# Patient Record
Sex: Female | Born: 1960
Health system: Southern US, Community
[De-identification: ages and names within clinical notes are randomized; demographics above are authoritative.]

## PROBLEM LIST (undated history)

## (undated) DIAGNOSIS — N289 Disorder of kidney and ureter, unspecified: Secondary | ICD-10-CM

## (undated) DIAGNOSIS — R519 Headache, unspecified: Secondary | ICD-10-CM

## (undated) DIAGNOSIS — J3089 Other allergic rhinitis: Secondary | ICD-10-CM

## (undated) DIAGNOSIS — H33049 Retinal detachment with retinal dialysis, unspecified eye: Secondary | ICD-10-CM

## (undated) DIAGNOSIS — D649 Anemia, unspecified: Secondary | ICD-10-CM

## (undated) DIAGNOSIS — R0601 Orthopnea: Secondary | ICD-10-CM

## (undated) DIAGNOSIS — Z992 Dependence on renal dialysis: Secondary | ICD-10-CM

## (undated) DIAGNOSIS — E119 Type 2 diabetes mellitus without complications: Secondary | ICD-10-CM

## (undated) DIAGNOSIS — N189 Chronic kidney disease, unspecified: Secondary | ICD-10-CM

## (undated) DIAGNOSIS — I219 Acute myocardial infarction, unspecified: Secondary | ICD-10-CM

## (undated) DIAGNOSIS — J449 Chronic obstructive pulmonary disease, unspecified: Secondary | ICD-10-CM

## (undated) DIAGNOSIS — J45909 Unspecified asthma, uncomplicated: Secondary | ICD-10-CM

## (undated) DIAGNOSIS — I5031 Acute diastolic (congestive) heart failure: Secondary | ICD-10-CM

## (undated) DIAGNOSIS — K3184 Gastroparesis: Secondary | ICD-10-CM

## (undated) DIAGNOSIS — Z8719 Personal history of other diseases of the digestive system: Secondary | ICD-10-CM

## (undated) DIAGNOSIS — R51 Headache: Secondary | ICD-10-CM

## (undated) DIAGNOSIS — J40 Bronchitis, not specified as acute or chronic: Secondary | ICD-10-CM

## (undated) DIAGNOSIS — N186 End stage renal disease: Secondary | ICD-10-CM

## (undated) DIAGNOSIS — I2510 Atherosclerotic heart disease of native coronary artery without angina pectoris: Secondary | ICD-10-CM

## (undated) DIAGNOSIS — R600 Localized edema: Secondary | ICD-10-CM

## (undated) DIAGNOSIS — J189 Pneumonia, unspecified organism: Secondary | ICD-10-CM

## (undated) DIAGNOSIS — C649 Malignant neoplasm of unspecified kidney, except renal pelvis: Secondary | ICD-10-CM

## (undated) DIAGNOSIS — E215 Disorder of parathyroid gland, unspecified: Secondary | ICD-10-CM

## (undated) DIAGNOSIS — K5790 Diverticulosis of intestine, part unspecified, without perforation or abscess without bleeding: Secondary | ICD-10-CM

## (undated) DIAGNOSIS — F329 Major depressive disorder, single episode, unspecified: Secondary | ICD-10-CM

## (undated) DIAGNOSIS — E785 Hyperlipidemia, unspecified: Secondary | ICD-10-CM

## (undated) DIAGNOSIS — F419 Anxiety disorder, unspecified: Secondary | ICD-10-CM

## (undated) DIAGNOSIS — R0600 Dyspnea, unspecified: Secondary | ICD-10-CM

## (undated) DIAGNOSIS — S62109A Fracture of unspecified carpal bone, unspecified wrist, initial encounter for closed fracture: Secondary | ICD-10-CM

## (undated) DIAGNOSIS — K219 Gastro-esophageal reflux disease without esophagitis: Secondary | ICD-10-CM

## (undated) DIAGNOSIS — R062 Wheezing: Secondary | ICD-10-CM

## (undated) DIAGNOSIS — F32A Depression, unspecified: Secondary | ICD-10-CM

## (undated) DIAGNOSIS — Z862 Personal history of diseases of the blood and blood-forming organs and certain disorders involving the immune mechanism: Secondary | ICD-10-CM

## (undated) DIAGNOSIS — R42 Dizziness and giddiness: Secondary | ICD-10-CM

## (undated) DIAGNOSIS — M1990 Unspecified osteoarthritis, unspecified site: Secondary | ICD-10-CM

## (undated) DIAGNOSIS — E1140 Type 2 diabetes mellitus with diabetic neuropathy, unspecified: Secondary | ICD-10-CM

## (undated) DIAGNOSIS — I10 Essential (primary) hypertension: Secondary | ICD-10-CM

## (undated) DIAGNOSIS — H9190 Unspecified hearing loss, unspecified ear: Secondary | ICD-10-CM

## (undated) DIAGNOSIS — I739 Peripheral vascular disease, unspecified: Secondary | ICD-10-CM

## (undated) DIAGNOSIS — I209 Angina pectoris, unspecified: Secondary | ICD-10-CM

## (undated) DIAGNOSIS — I340 Nonrheumatic mitral (valve) insufficiency: Secondary | ICD-10-CM

## (undated) HISTORY — PX: CATARACT EXTRACTION W/ INTRAOCULAR LENS IMPLANT: SHX1309

## (undated) HISTORY — PX: BREAST CYST EXCISION: SHX579

## (undated) HISTORY — DX: Chronic kidney disease, unspecified: N18.9

## (undated) HISTORY — PX: DIALYSIS FISTULA CREATION: SHX611

## (undated) HISTORY — DX: Personal history of diseases of the blood and blood-forming organs and certain disorders involving the immune mechanism: Z86.2

## (undated) HISTORY — PX: CHOLECYSTECTOMY: SHX55

## (undated) HISTORY — PX: HAND SURGERY: SHX662

## (undated) HISTORY — PX: OTHER SURGICAL HISTORY: SHX169

## (undated) HISTORY — PX: APPENDECTOMY: SHX54

---

## 1990-09-22 HISTORY — PX: ABDOMINAL HYSTERECTOMY: SHX81

## 2003-12-09 ENCOUNTER — Other Ambulatory Visit: Payer: Self-pay

## 2003-12-10 ENCOUNTER — Other Ambulatory Visit: Payer: Self-pay

## 2004-01-27 ENCOUNTER — Other Ambulatory Visit: Payer: Self-pay

## 2004-02-09 ENCOUNTER — Other Ambulatory Visit: Payer: Self-pay

## 2004-07-22 ENCOUNTER — Inpatient Hospital Stay: Payer: Self-pay | Admitting: Anesthesiology

## 2004-07-26 ENCOUNTER — Emergency Department: Payer: Self-pay | Admitting: Emergency Medicine

## 2004-07-26 ENCOUNTER — Other Ambulatory Visit: Payer: Self-pay

## 2004-07-27 ENCOUNTER — Inpatient Hospital Stay: Payer: Self-pay | Admitting: Internal Medicine

## 2004-09-09 ENCOUNTER — Emergency Department: Payer: Self-pay | Admitting: General Practice

## 2004-09-09 ENCOUNTER — Other Ambulatory Visit: Payer: Self-pay

## 2004-09-22 DIAGNOSIS — H33049 Retinal detachment with retinal dialysis, unspecified eye: Secondary | ICD-10-CM

## 2004-09-22 HISTORY — DX: Retinal detachment with retinal dialysis, unspecified eye: H33.049

## 2004-11-05 ENCOUNTER — Other Ambulatory Visit: Payer: Self-pay

## 2004-11-05 ENCOUNTER — Inpatient Hospital Stay: Payer: Self-pay | Admitting: Internal Medicine

## 2006-01-20 ENCOUNTER — Ambulatory Visit: Payer: Self-pay | Admitting: Family Medicine

## 2006-02-28 ENCOUNTER — Emergency Department: Payer: Self-pay | Admitting: Emergency Medicine

## 2006-02-28 ENCOUNTER — Other Ambulatory Visit: Payer: Self-pay

## 2006-04-08 ENCOUNTER — Ambulatory Visit: Payer: Self-pay | Admitting: Vascular Surgery

## 2006-05-22 ENCOUNTER — Ambulatory Visit: Payer: Self-pay | Admitting: Vascular Surgery

## 2006-06-16 ENCOUNTER — Ambulatory Visit: Payer: Self-pay | Admitting: Vascular Surgery

## 2006-06-19 IMAGING — CR DG CHEST 2V
1 series · 2 of 2 positions shown · non-contrast
Comparison: none

REASON FOR EXAM: Shortness of breath, cough
COMMENTS:

PROCEDURE:     DXR - DXR CHEST PA (OR AP) AND LATERAL  - February 11, 2006 [DATE]
RESULT:     The lung fields are clear. The heart, mediastinal and osseous
structures show no significant abnormalities.

[Series 1: view not recorded · 0.17mm/px · 2 of 2 slices shown]
[im 1/2]
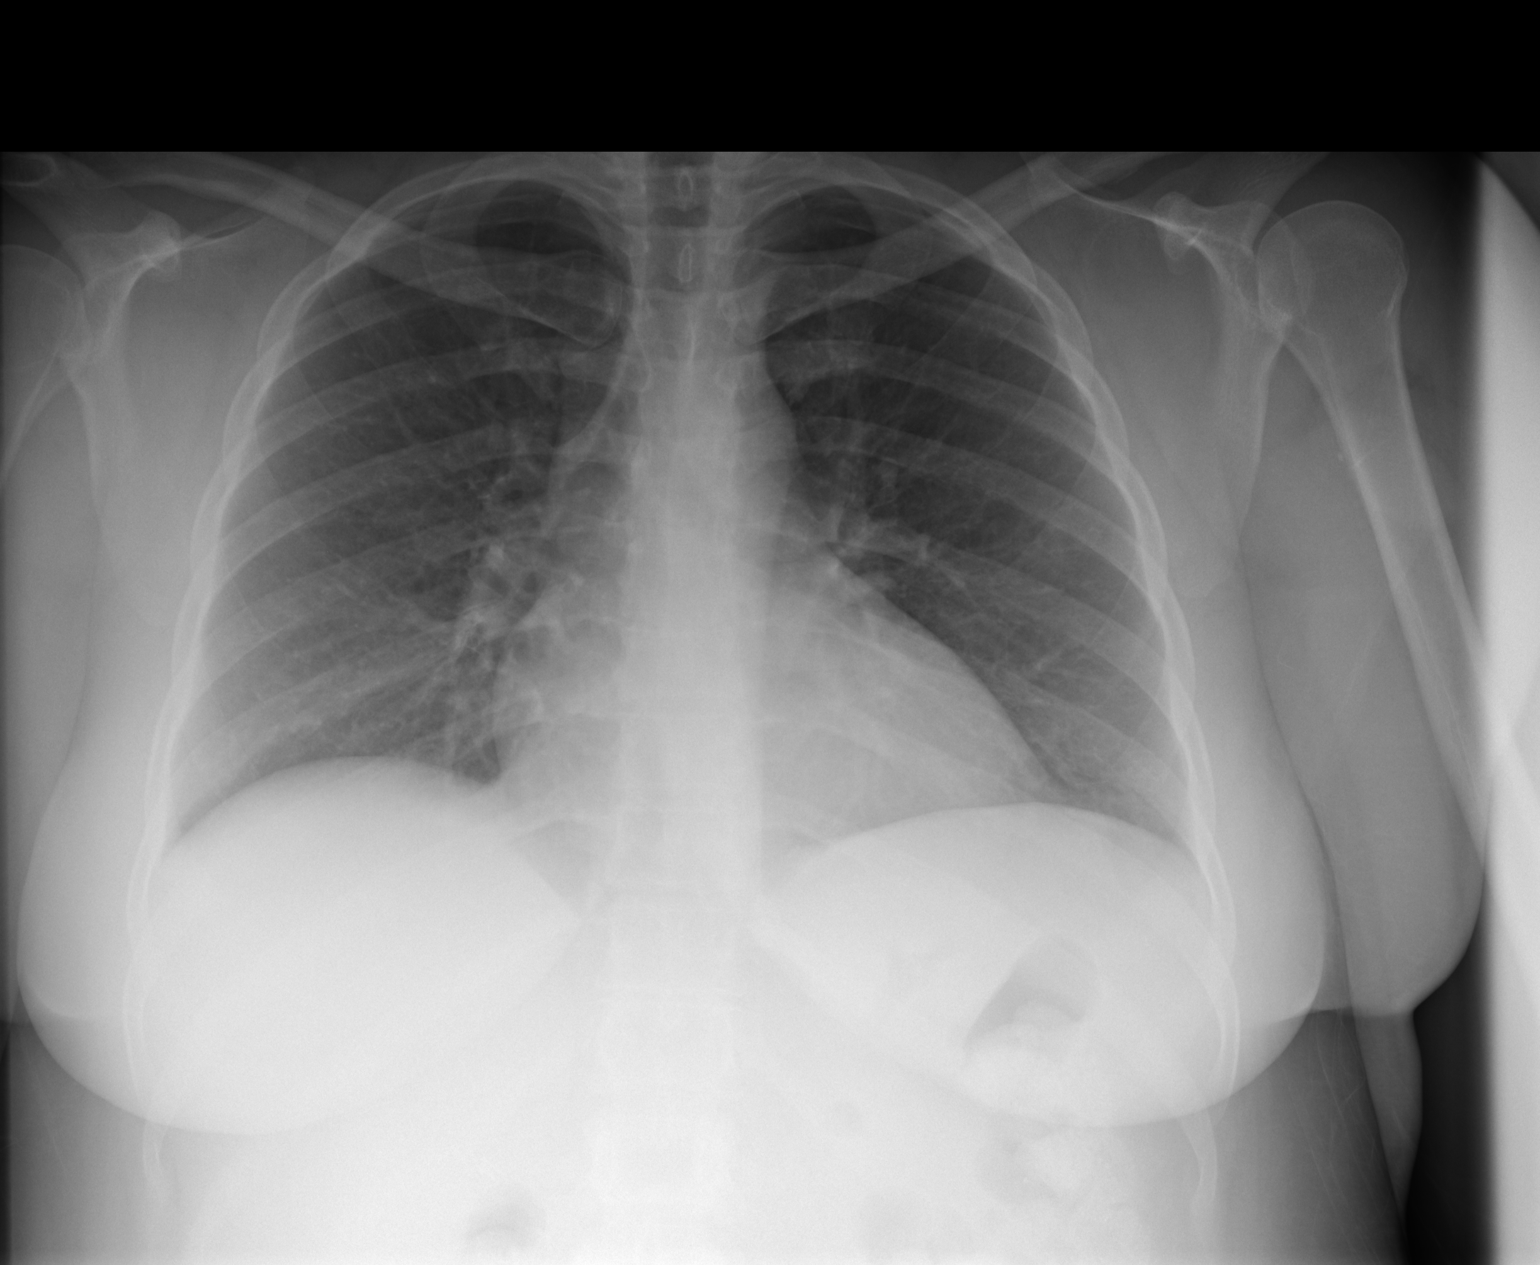
[im 2/2]
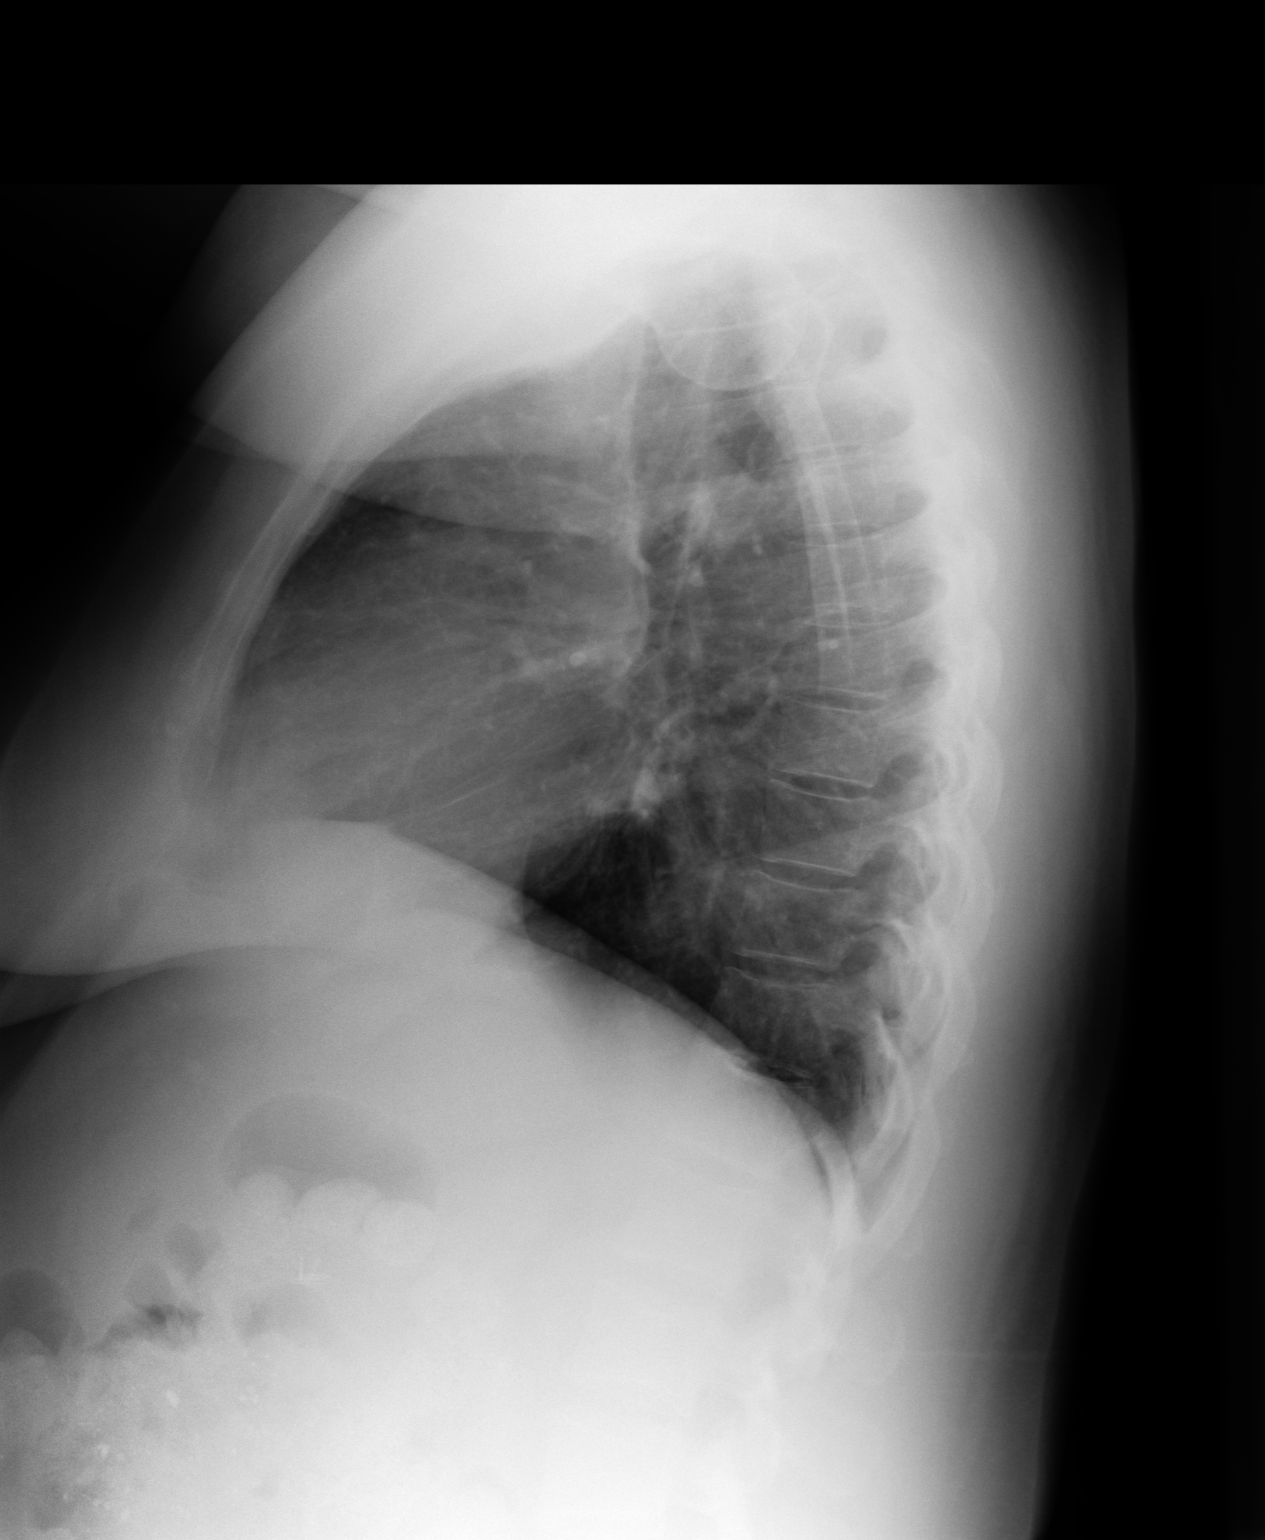

[2 of 2 positions shown; findings below may reference images not displayed]

IMPRESSION: No significant abnormalities are noted.

## 2006-07-06 IMAGING — CR DG CHEST 1V PORT
1 series · 1 of 1 positions shown · non-contrast
Comparison: none

REASON FOR EXAM: chest pain
COMMENTS:  LMP: Post-Menopausal

[view not recorded]
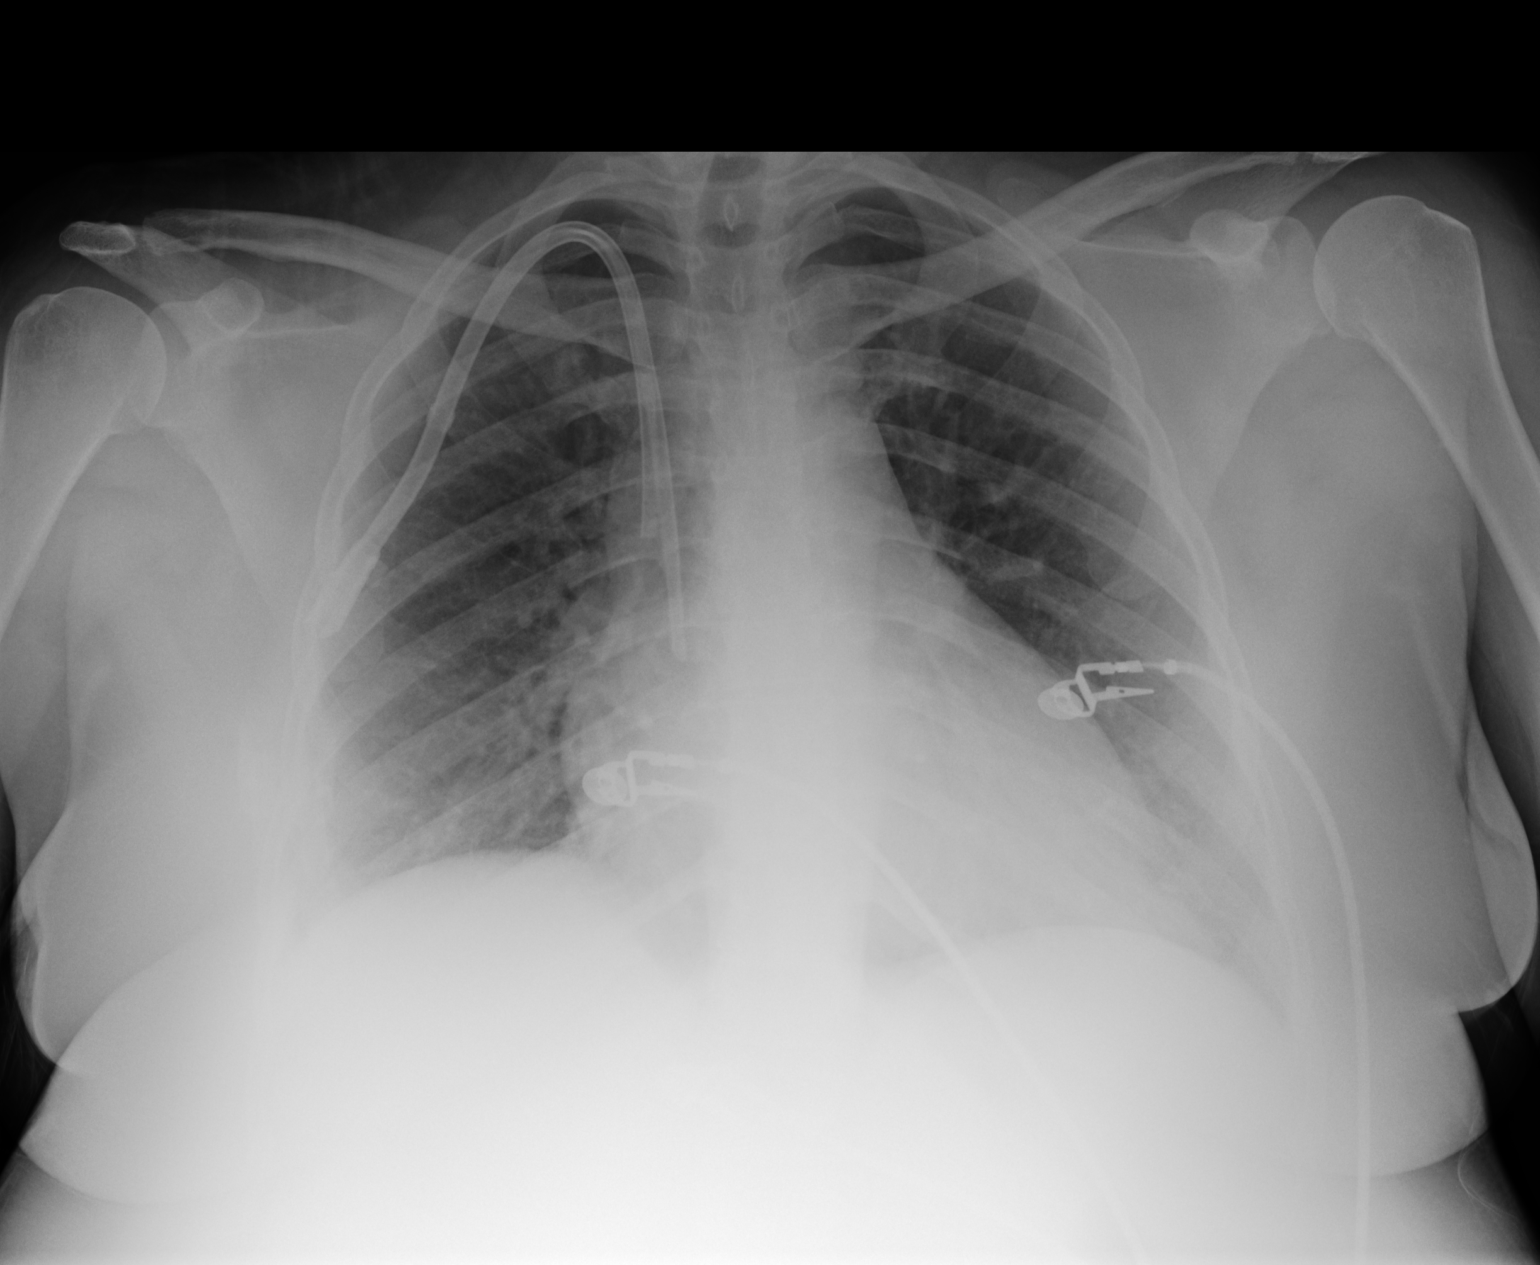

[1 of 1 positions shown; findings below may reference images not displayed]

PROCEDURE:     DXR - DXR PORTABLE CHEST SINGLE VIEW  - February 28, 2006 [DATE]

RESULT:          Comparison is made to a prior study dated 02/11/2006.

The patient is taking a shallow inspiration.  A RIGHT-sided dialysis
catheter is appreciated with the lead tip projected in the region of the
superior vena cava.  With technique taken into consideration, there is
thickening of the interstitial markings with an element of peribronchial
cuffing.  The cardiac silhouette is enlarged indicative of cardiomegaly.
The visualized bony skeleton demonstrates no evidence of fracture or
dislocation.  No focal regions of consolidation are appreciated.
IMPRESSION: 1.     Shallow inspiration.
2.     No focal regions of consolidation.
3.     Findings which appear to be consistent with an element of pulmonary
vascular congestion/pulmonary edema.

## 2007-03-11 ENCOUNTER — Ambulatory Visit: Payer: Self-pay | Admitting: Vascular Surgery

## 2007-03-31 ENCOUNTER — Emergency Department: Payer: Self-pay | Admitting: Emergency Medicine

## 2007-03-31 ENCOUNTER — Other Ambulatory Visit: Payer: Self-pay

## 2007-06-08 ENCOUNTER — Ambulatory Visit: Payer: Self-pay | Admitting: Gastroenterology

## 2007-07-20 ENCOUNTER — Ambulatory Visit: Payer: Self-pay | Admitting: Vascular Surgery

## 2007-08-06 IMAGING — CT CT STONE STUDY
1 of 2 series · 15 of 32 positions shown, 19 images · non-contrast
Comparison: none

REASON FOR EXAM: pain sudden onset
COMMENTS:

[Series 2: stone · axial · 0.82mm/px · z∈[+180,+600]mm · 15 of 154 slices shown, 19 images]
[im 7/154  soft-tissue]
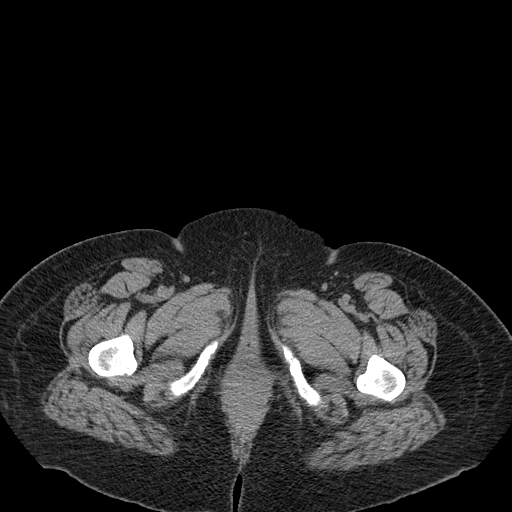
[im 7/154  bone]
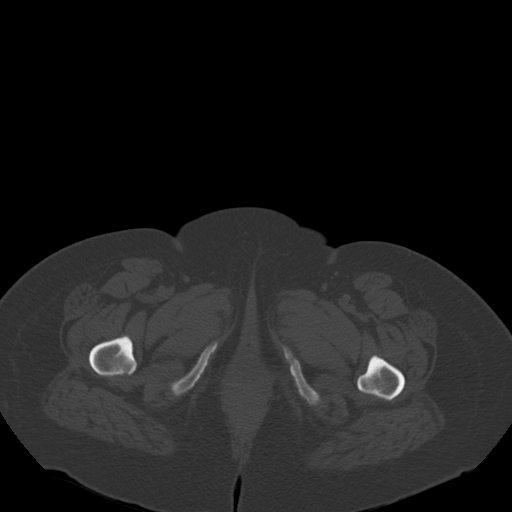
[im 19/154  soft-tissue]
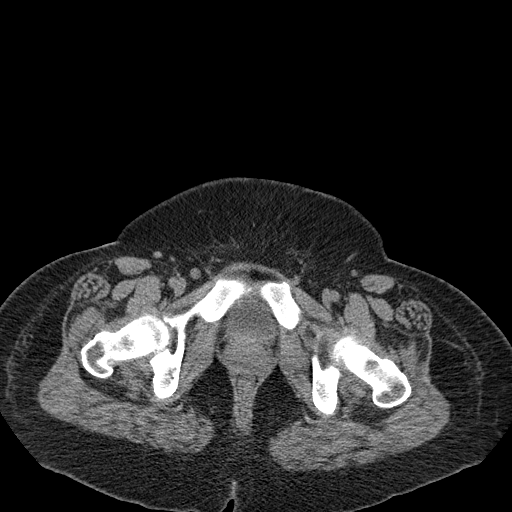
[im 31/154  soft-tissue]
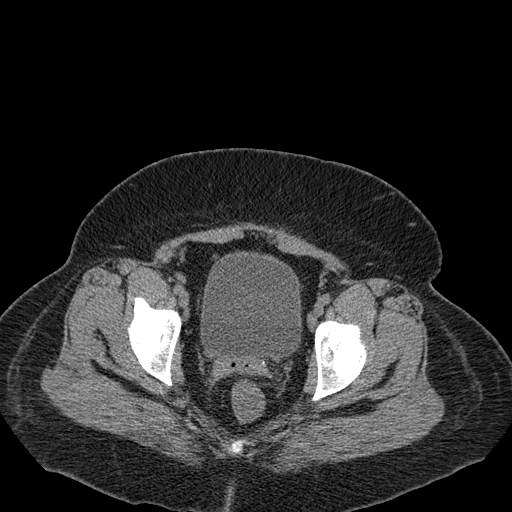
[im 43/154  soft-tissue]
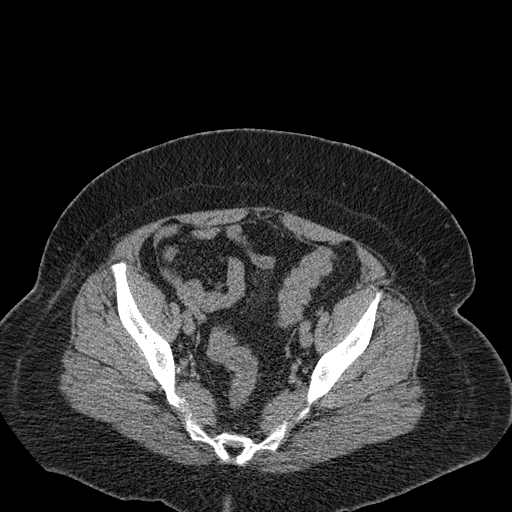
[im 56/154  soft-tissue]
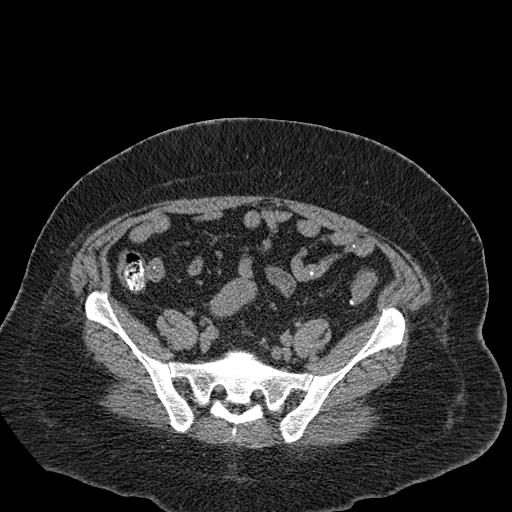
[im 68/154  soft-tissue]
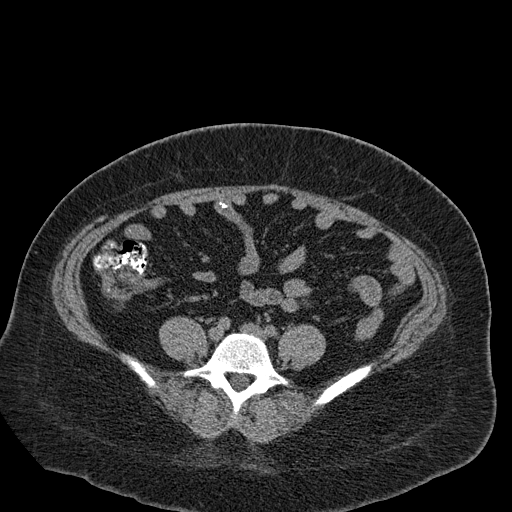
[im 80/154  soft-tissue]
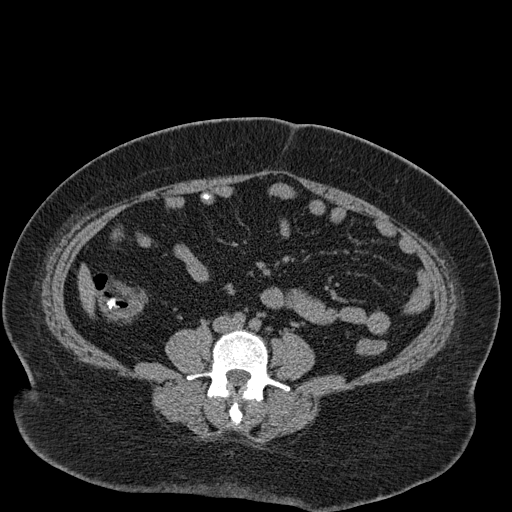
[im 86/154  soft-tissue]
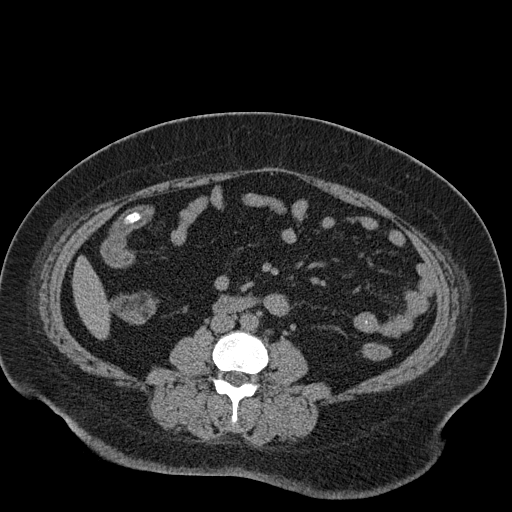
[im 98/154  soft-tissue]
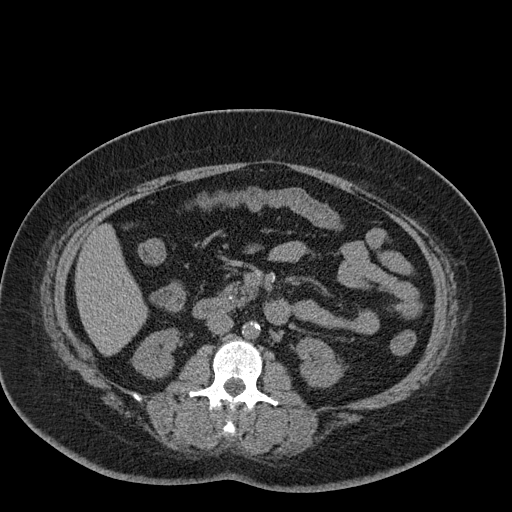
[im 98/154  bone]
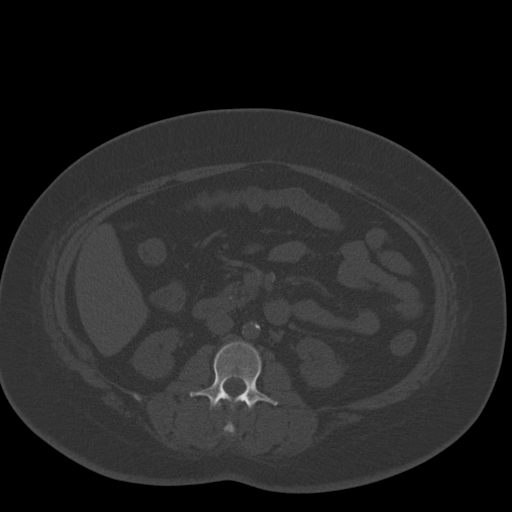
[im 111/154  soft-tissue]
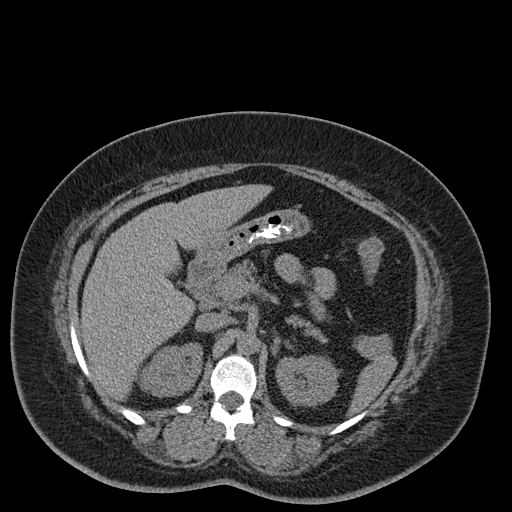
[im 123/154  soft-tissue]
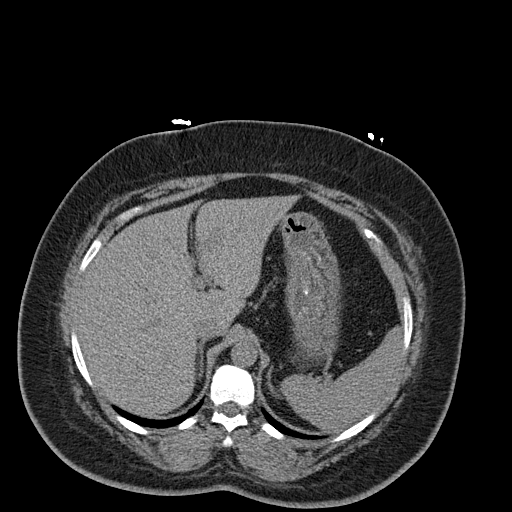
[im 129/154  lung]
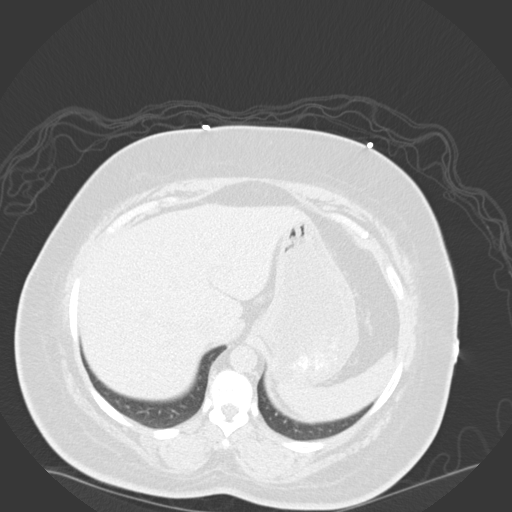
[im 135/154  soft-tissue]
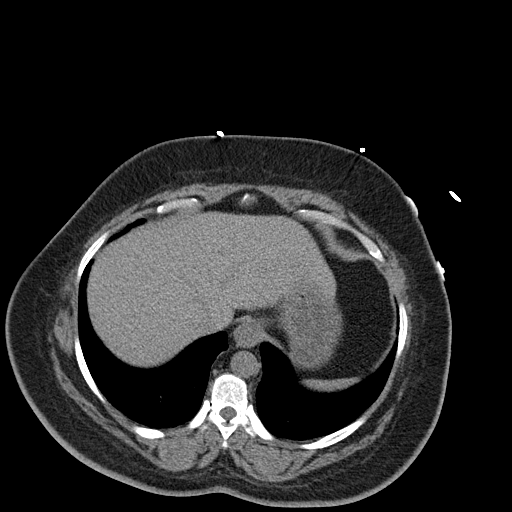
[im 135/154  lung]
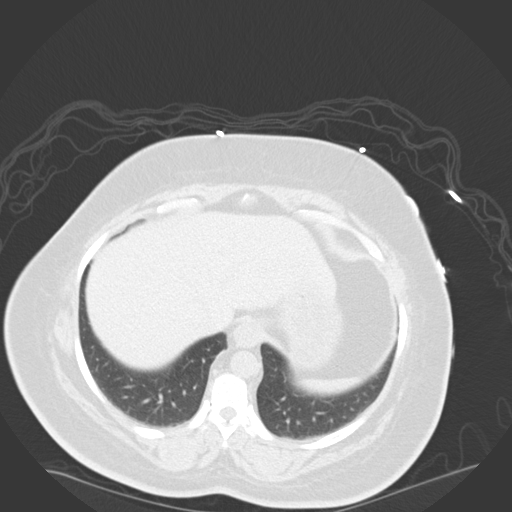
[im 141/154  lung]
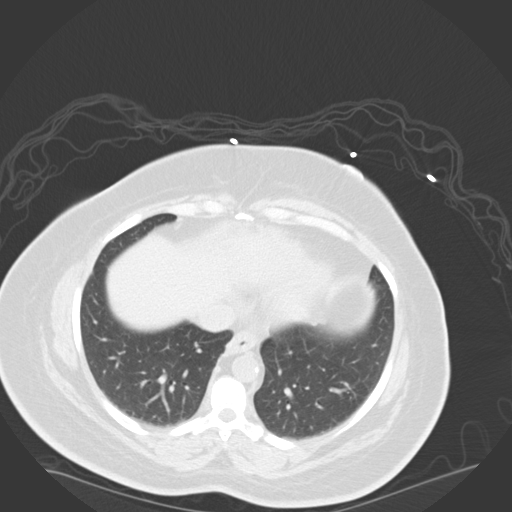
[im 147/154  soft-tissue]
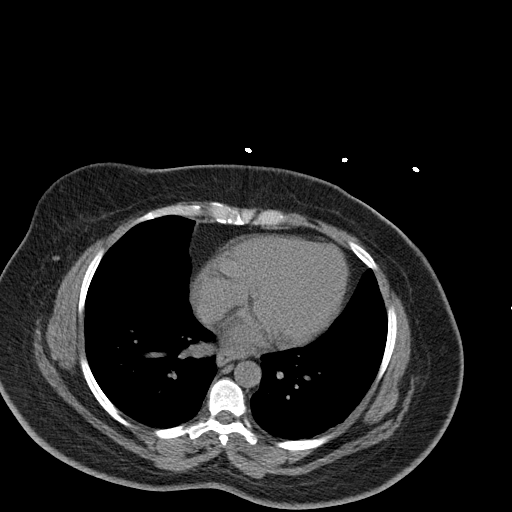
[im 147/154  lung]
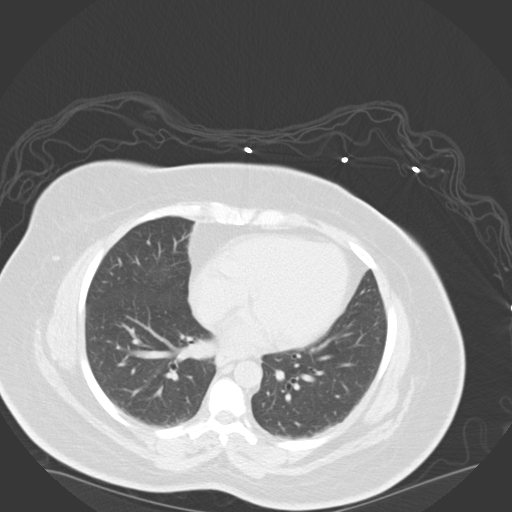

[15 of 32 positions shown; findings below may reference images not displayed]

PROCEDURE:     CT  - CT ABDOMEN /PELVIS WO (STONE)  - March 31, 2007  [DATE]

RESULT:      The study was performed without IV contrast as requested.

The liver exhibits normal density with no evidence of a mass or ductal
dilation. The gallbladder is surgically absent. The pancreas, spleen,
partially distended stomach, adrenal glands, and kidneys exhibit no acute
abnormality. There is no evidence of urinary tract obstruction. The
perinephric fat is normal in density. The appendix is surgically absent. The
unopacified loops of small and large bowel exhibit no acute abnormality. The
urinary bladder is normal in appearance. The caliber of the abdominal aorta
is normal. The paraortic and pericaval regions also are normal. The uterus
is apparently surgically absent. There are no adnexal masses. The lung bases
are clear.
IMPRESSION: 1. There is no evidence of urinary tract obstruction or calcified stones.
2. I do not see acute abnormality of the bowel.
3. No acute abnormality of the hepatobiliary tree is identified.

## 2007-08-18 ENCOUNTER — Other Ambulatory Visit: Payer: Self-pay

## 2007-08-18 ENCOUNTER — Emergency Department: Payer: Self-pay | Admitting: Internal Medicine

## 2007-08-19 ENCOUNTER — Emergency Department: Payer: Self-pay | Admitting: Internal Medicine

## 2007-12-02 ENCOUNTER — Other Ambulatory Visit: Payer: Self-pay

## 2007-12-02 ENCOUNTER — Emergency Department: Payer: Self-pay | Admitting: Emergency Medicine

## 2007-12-03 ENCOUNTER — Emergency Department: Payer: Self-pay | Admitting: Emergency Medicine

## 2007-12-03 ENCOUNTER — Other Ambulatory Visit: Payer: Self-pay

## 2007-12-13 ENCOUNTER — Emergency Department: Payer: Self-pay | Admitting: Emergency Medicine

## 2007-12-24 IMAGING — CR DG CHEST 1V PORT
1 series · 1 of 1 positions shown · non-contrast
Comparison: none

REASON FOR EXAM: fever
COMMENTS:

[view not recorded]
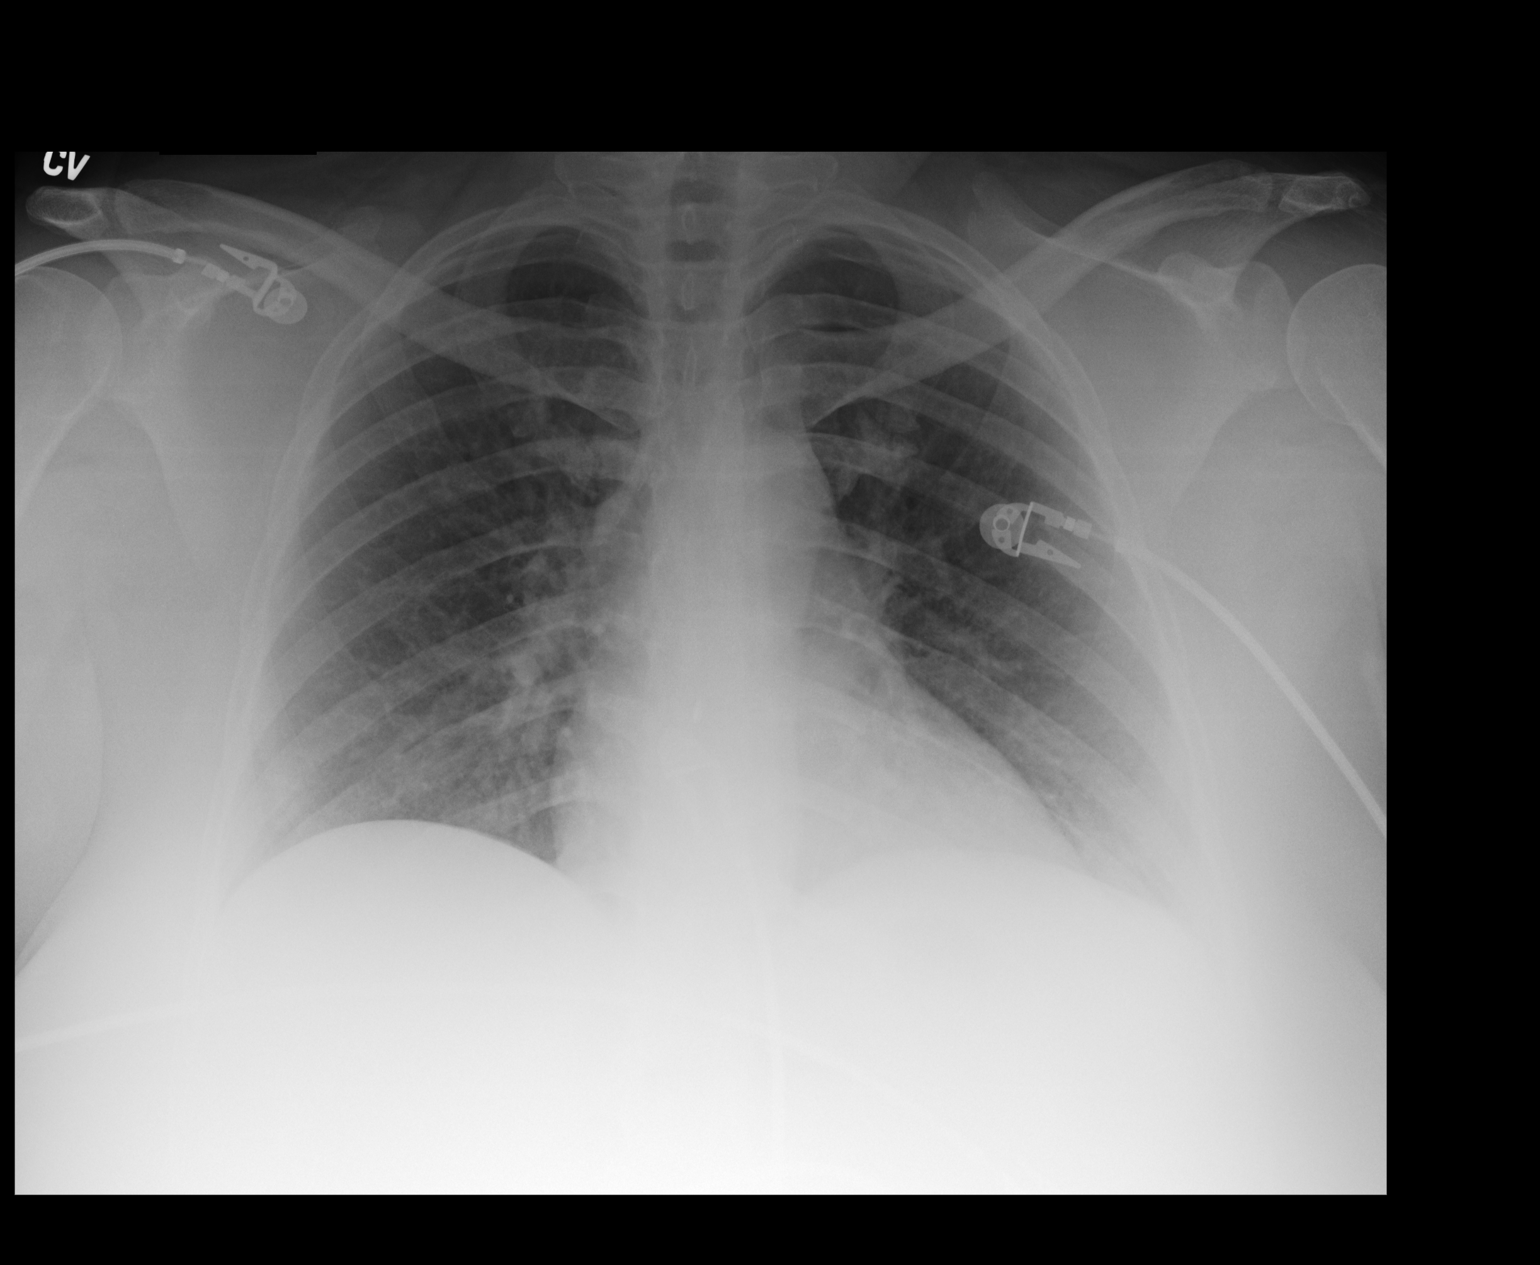

[1 of 1 positions shown; findings below may reference images not displayed]

PROCEDURE:     DXR - DXR PORTABLE CHEST SINGLE VIEW  - August 18, 2007  [DATE]

RESULT:     Comparison is made to a study 28 February, 2006.

The lungs are reasonably well inflated. There is no focal infiltrate. The
cardiac silhouette is top normal in size but stable. The pulmonary
vascularity is not engorged. The indwelling dual-lumen catheter seen on the
prior study has been withdrawn.
IMPRESSION: 1.     I do not see objective evidence of pneumonia or definite evidence of
other acute cardiopulmonary abnormality. A follow-up PA and lateral chest
x-ray would be of value if the patient has persistent cardiopulmonary
symptoms.

## 2007-12-24 IMAGING — CT CT HEAD WITHOUT CONTRAST
2 series · 16 of 30 positions shown, 20 images · non-contrast
Comparison: none

REASON FOR EXAM: headache
COMMENTS:

PROCEDURE:     CT  - CT HEAD WITHOUT CONTRAST  - August 18, 2007  [DATE]
RESULT:     Comparison: No available comparison exam.
Procedure: CT examination of the head was performed without intravenous
contrast. Collimation is 5 mm.

[Series 2: without · axial · non-contrast · 0.42mm/px · z∈[+268,+398]mm · 13 of 32 slices shown, 17 images]
[im 3/32  brain]
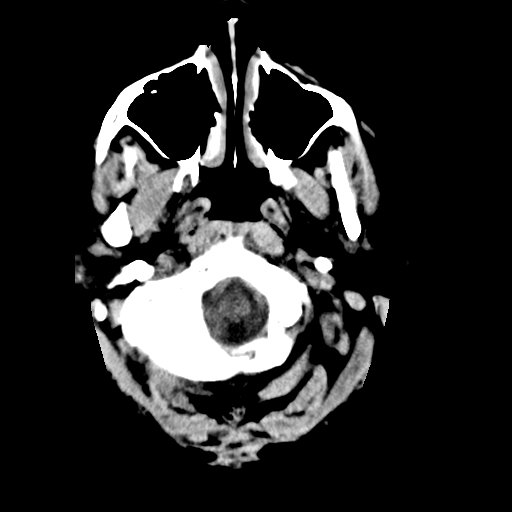
[im 3/32  bone]
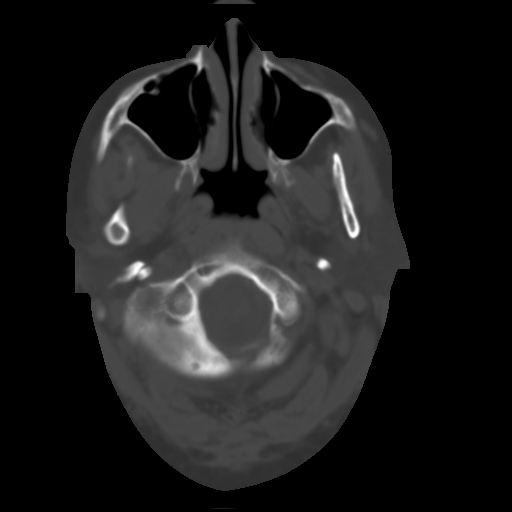
[im 5/32  brain]
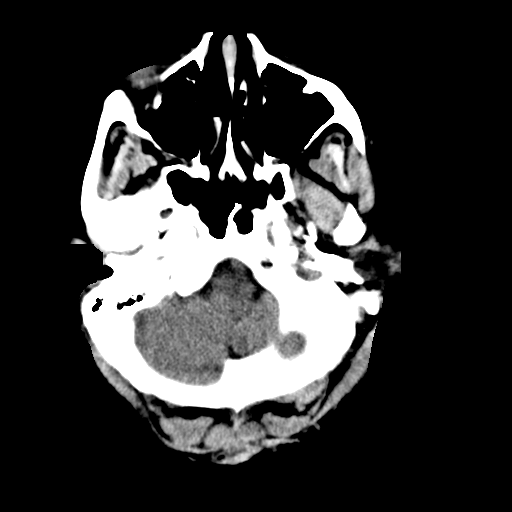
[im 7/32  brain]
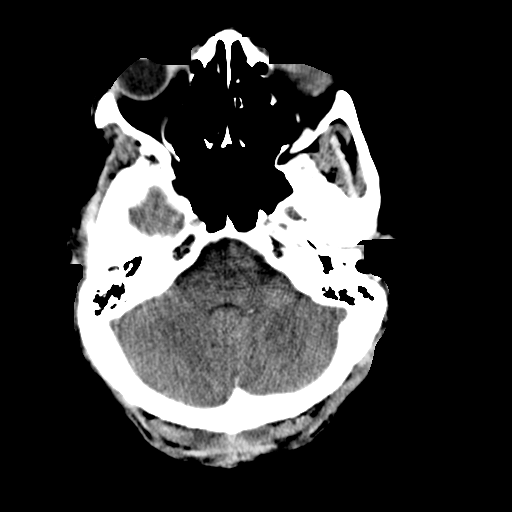
[im 9/32  brain]
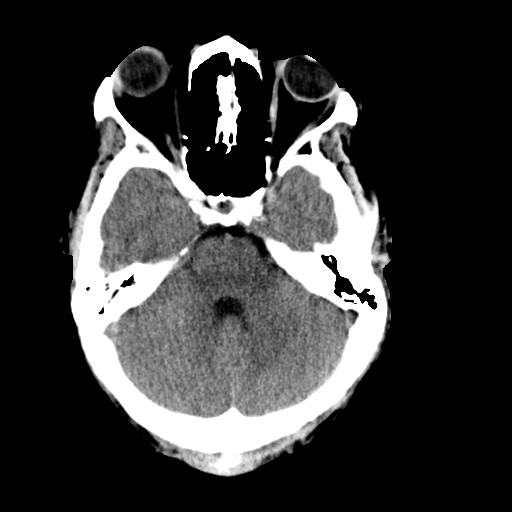
[im 12/32  brain]
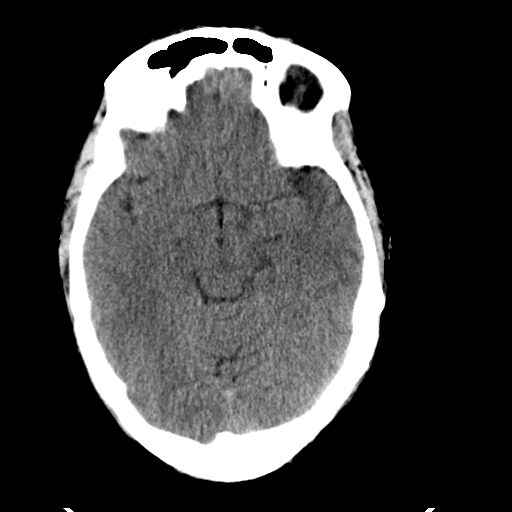
[im 12/32  bone]
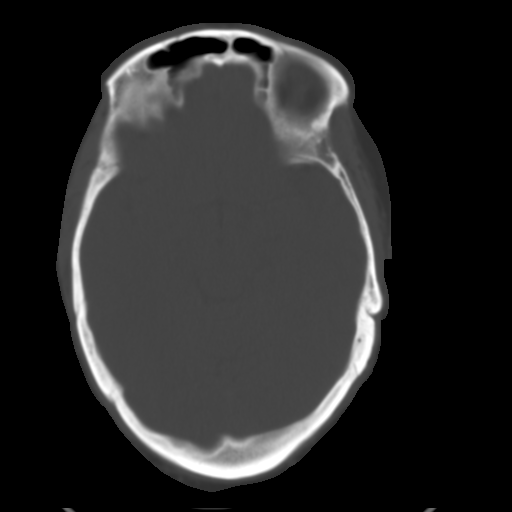
[im 14/32  brain]
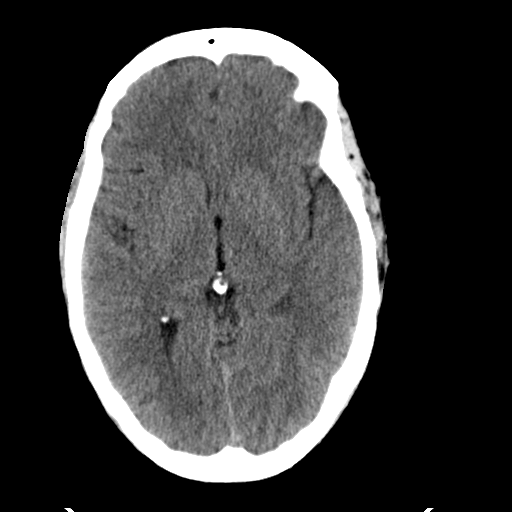
[im 16/32  brain]
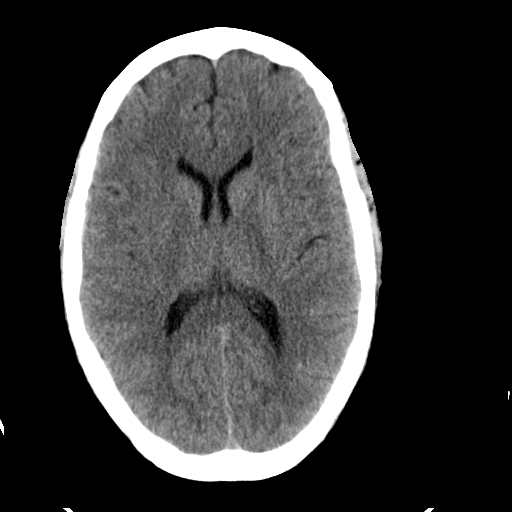
[im 18/32  brain]
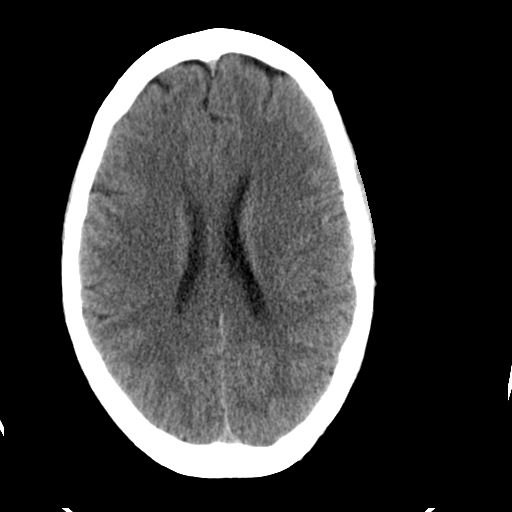
[im 20/32  brain]
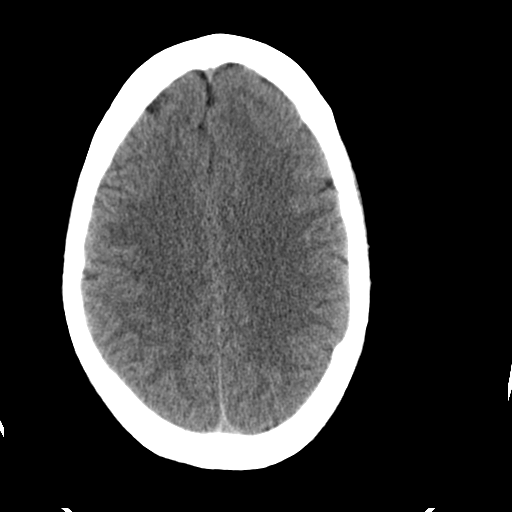
[im 20/32  bone]
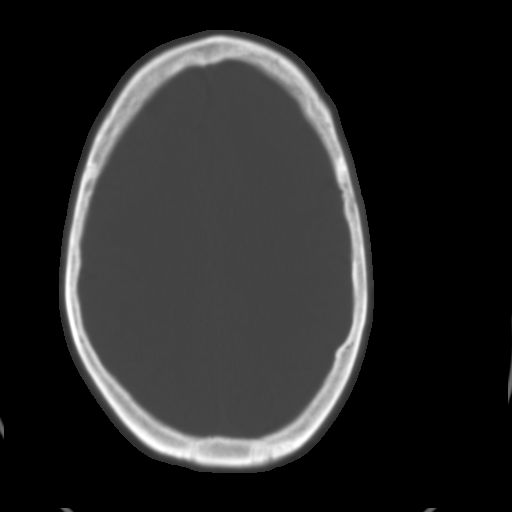
[im 23/32  brain]
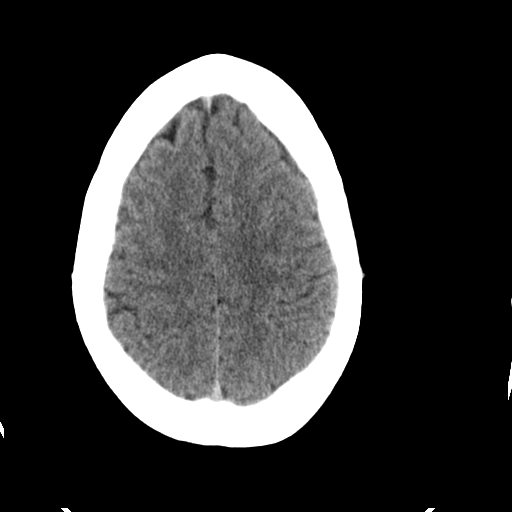
[im 25/32  brain]
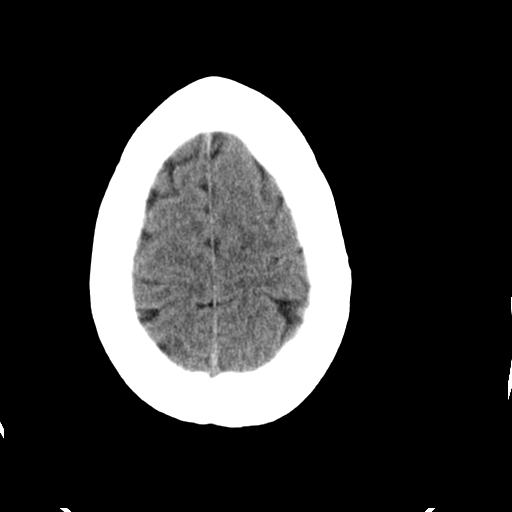
[im 27/32  brain]
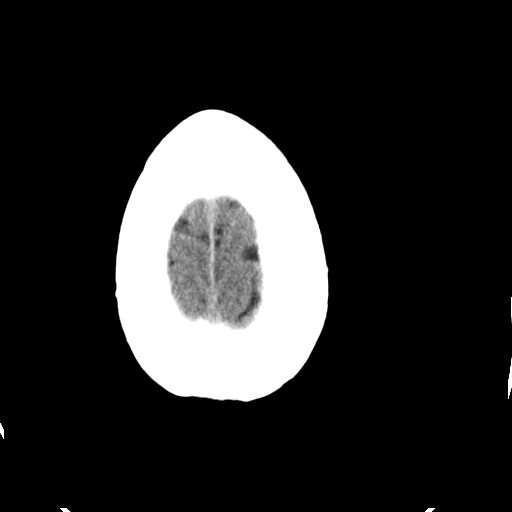
[im 29/32  brain]
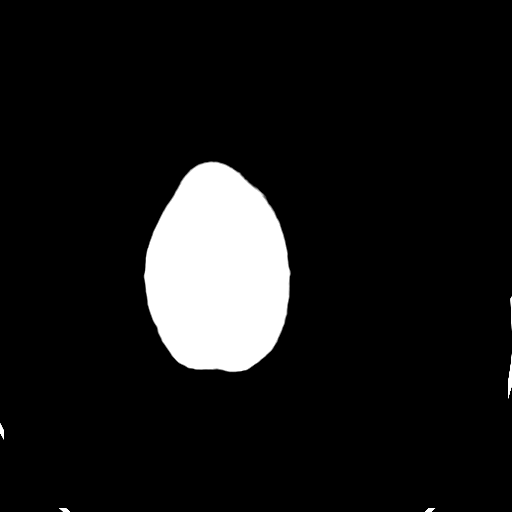
[im 29/32  bone]
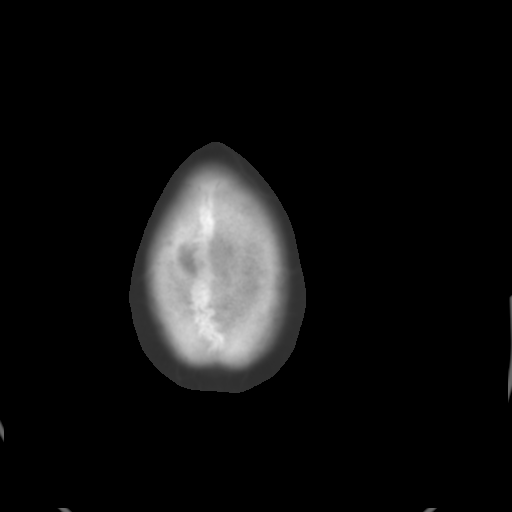

[Series 3: bone · axial · 0.42mm/px · z∈[+268,+312]mm · 3 of 32 slices shown]
[im 3/32  bone]
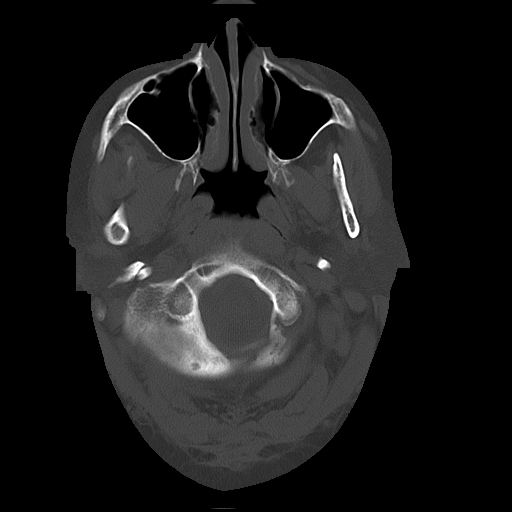
[im 7/32  bone]
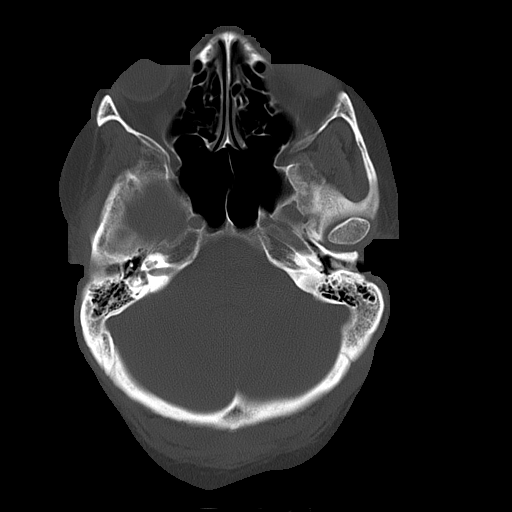
[im 12/32  bone]
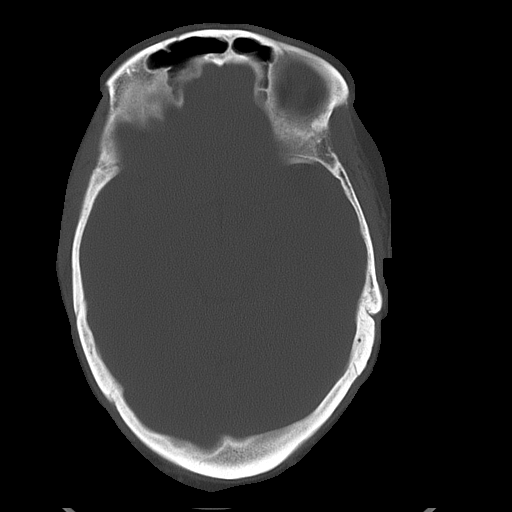

[16 of 30 positions shown; findings below may reference images not displayed]

FINDINGS: No evidence of intracranial hemorrhage, mass-effect, or ventricular
dilatation. The gray and white matters are differentiated. No displaced
calvarial fracture is noted. The visualized paranasal sinuses and mastoid
air cells are unremarkable.
IMPRESSION: 1. Unremarkable noncontrast CT of the head.

## 2008-04-08 IMAGING — CR DG ABDOMEN 3V
1 series · 5 of 5 positions shown · non-contrast
Comparison: none

REASON FOR EXAM: vomiting, evaluate for obstruction, distension
COMMENTS:

[Series 1: view not recorded · 0.17mm/px · 5 of 5 slices shown]
[im 1/5]
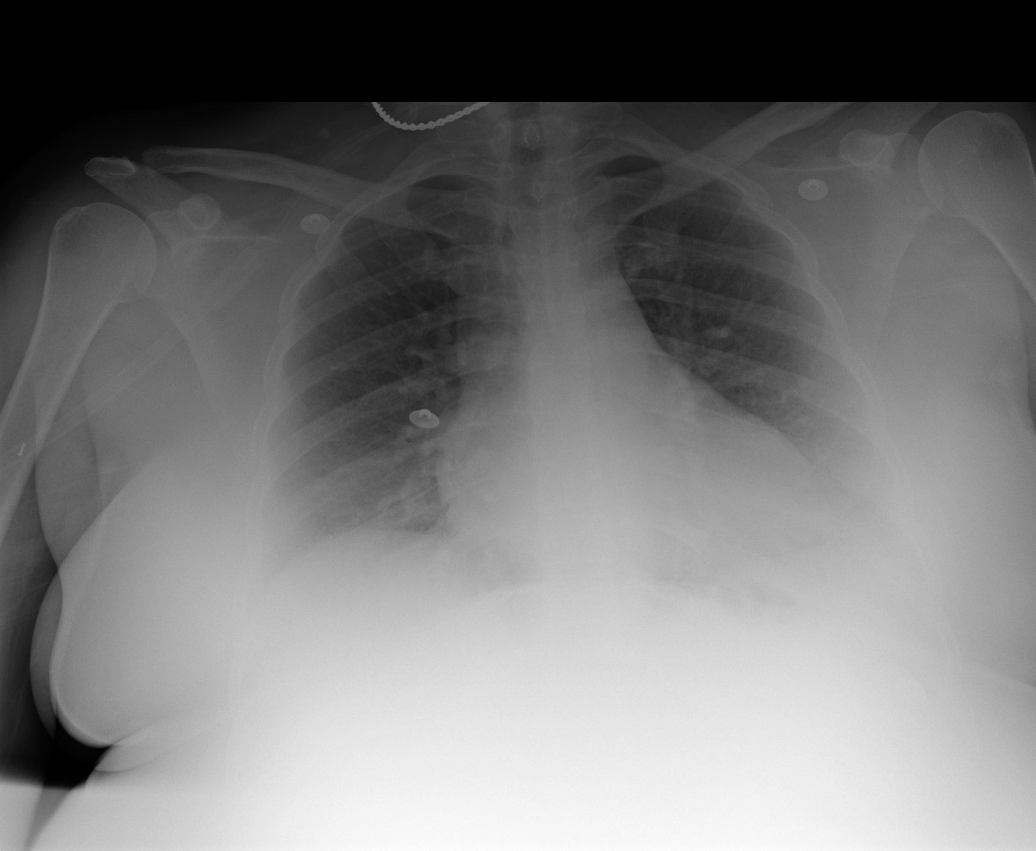
[im 2/5]
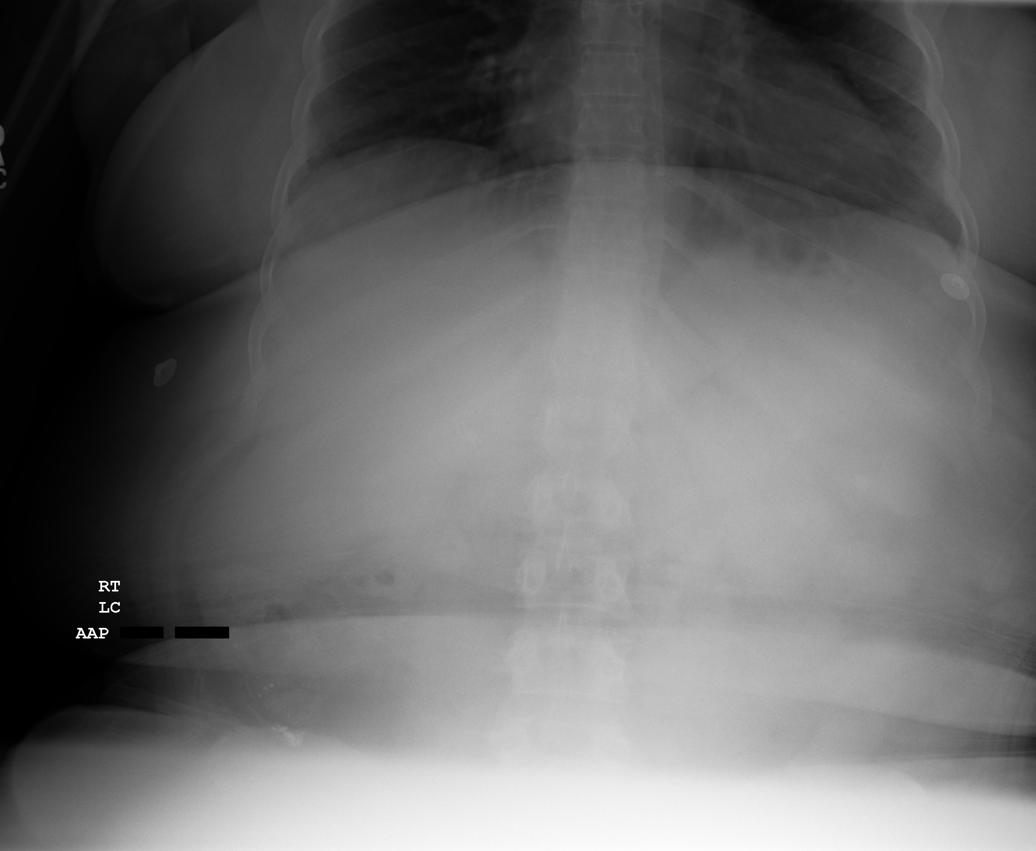
[im 3/5]
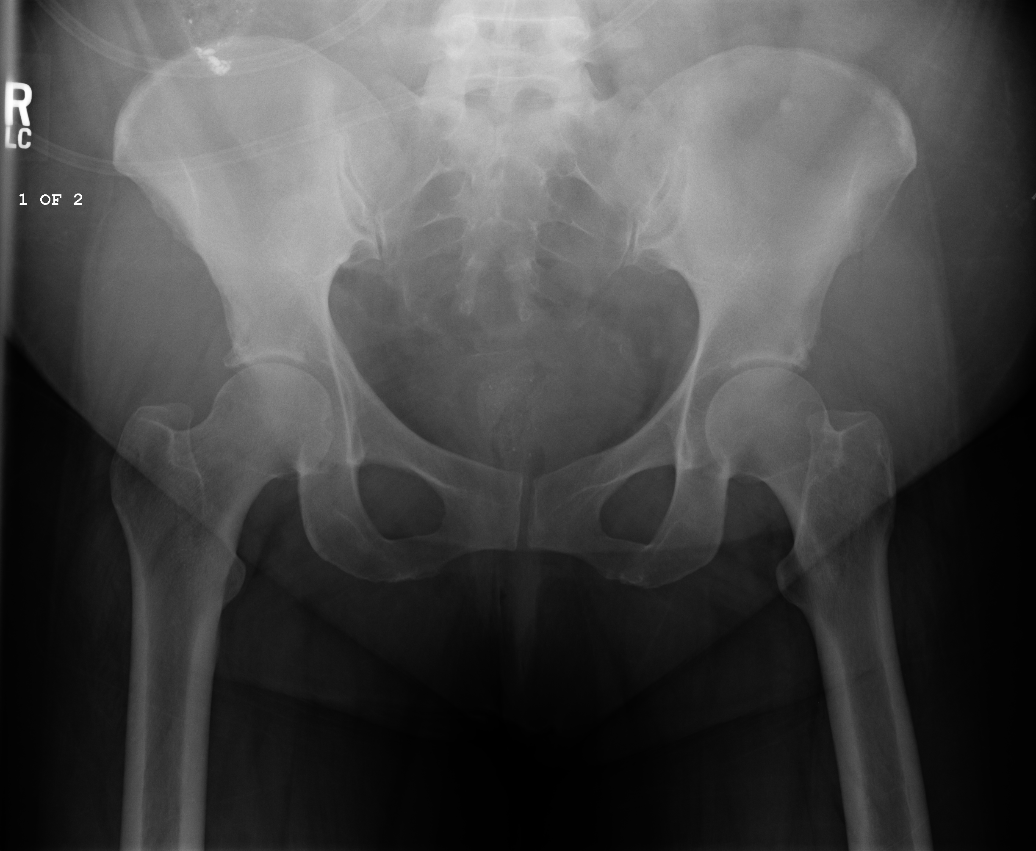
[im 4/5]
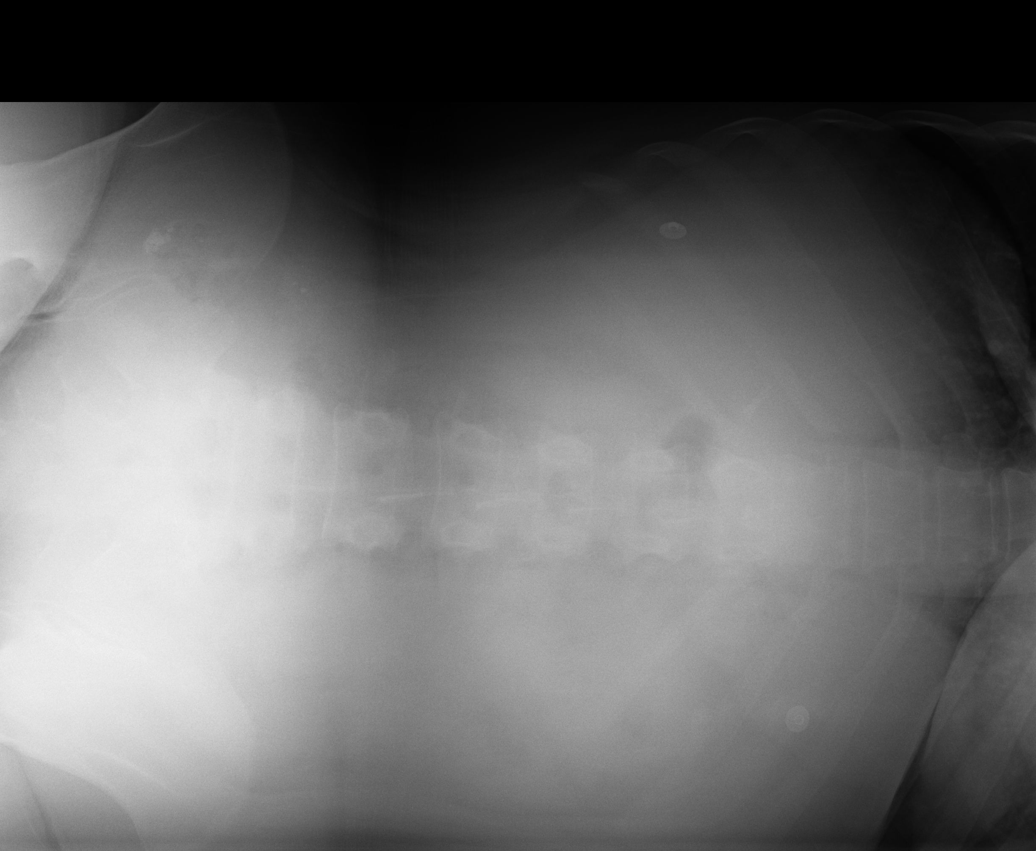
[im 5/5]
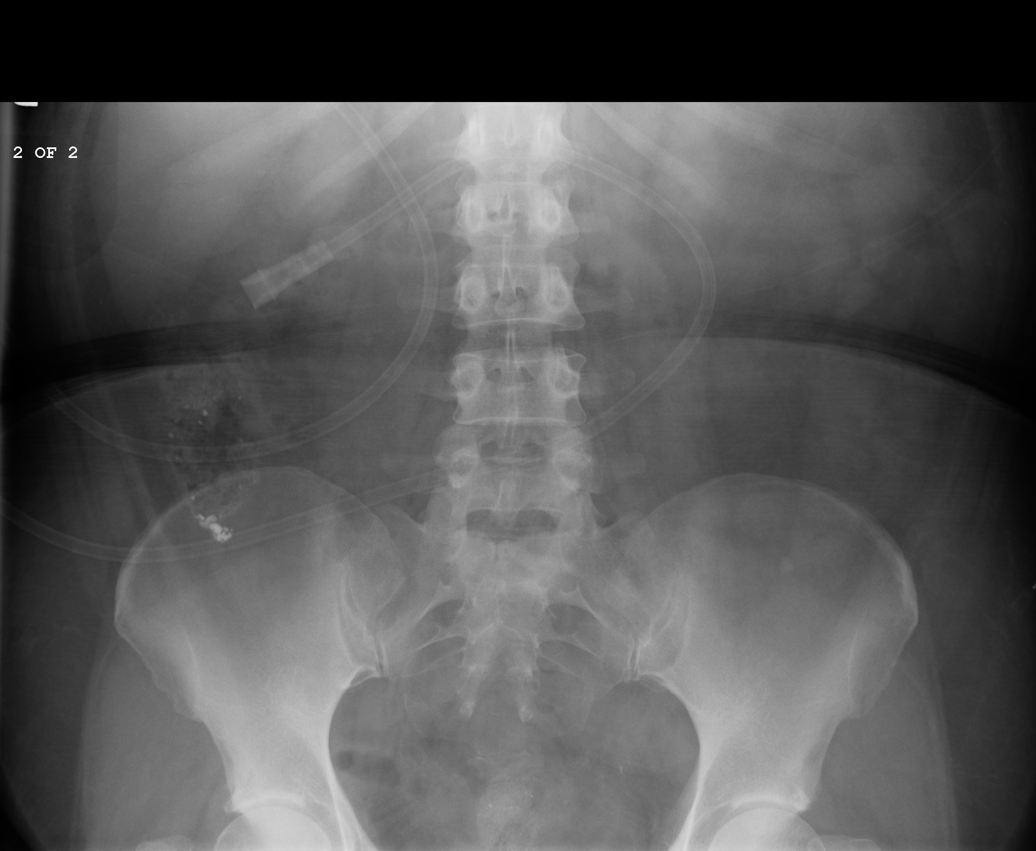

[5 of 5 positions shown; findings below may reference images not displayed]

PROCEDURE:     DXR - DXR ABDOMEN 3-WAY (INCL PA CXR)  - December 02, 2007  [DATE]

RESULT:     Comparison is made to the prior exam of 08/18/2007. The lung
fields are clear. No pneumonia, pneumothorax or pleural effusion is seen.
The cardiac silhouette is prominent but thought to be within the limits of
normal for the level of inspiration.

Four views of the abdomen were obtained. No subdiaphragmatic free air is
seen. The bowel gas pattern is normal. No dilated loops of bowel suspicious
for bowel obstruction are seen. No air-fluid levels are noted. No abnormal
intraabdominal calcifications are seen. The osseous structures show no
significant abnormalities.
IMPRESSION: 1.     No significant abnormalities are identified.

## 2008-04-09 IMAGING — CT CT ABD-PELV W/O CM
1 of 2 series · 15 of 32 positions shown, 19 images · non-contrast
Comparison: none

REASON FOR EXAM: (1) epigastric pain; (2) epigastic pain
COMMENTS:

[Series 2: stone · axial · 0.82mm/px · z∈[+556,+1006]mm · 15 of 164 slices shown, 19 images]
[im 7/164  soft-tissue]
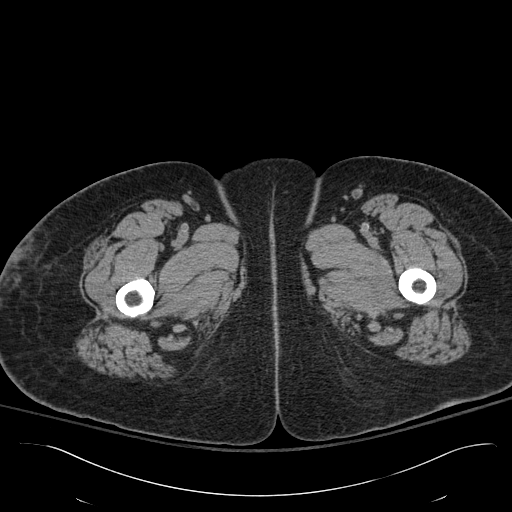
[im 7/164  bone]
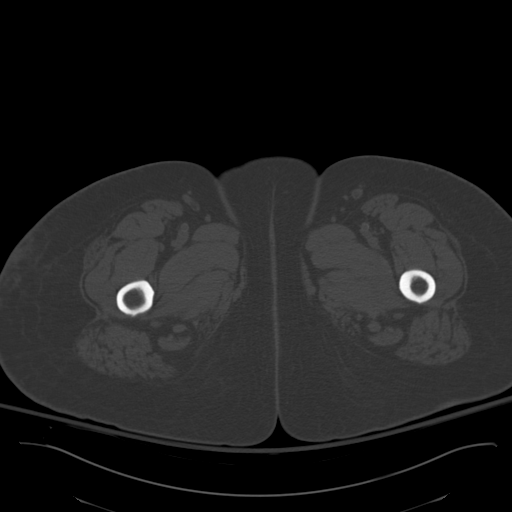
[im 21/164  soft-tissue]
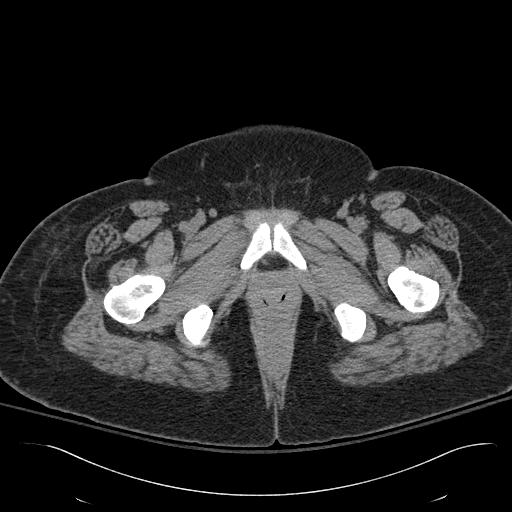
[im 34/164  soft-tissue]
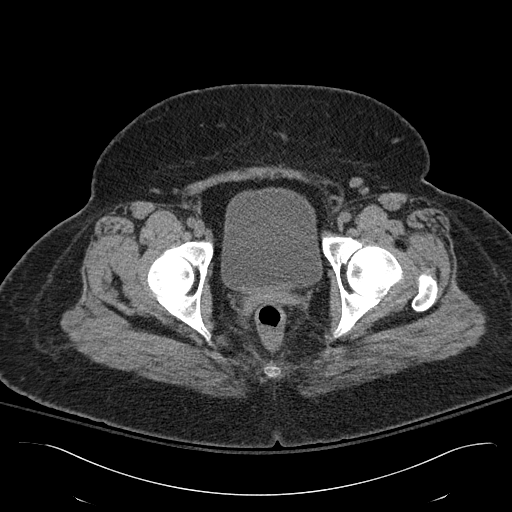
[im 48/164  soft-tissue]
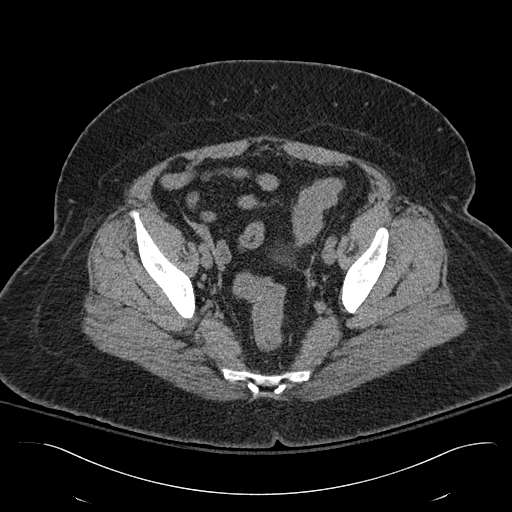
[im 55/164  soft-tissue]
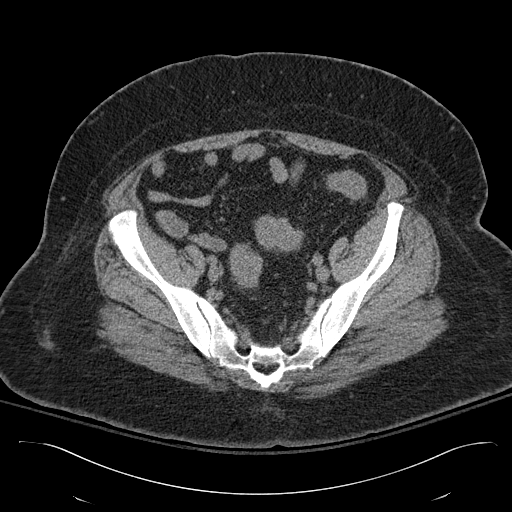
[im 68/164  soft-tissue]
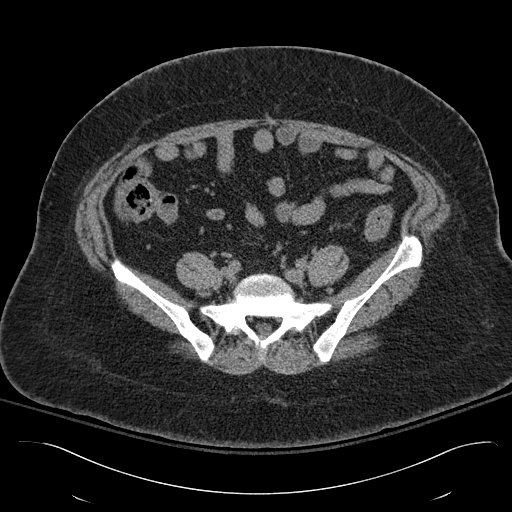
[im 82/164  soft-tissue]
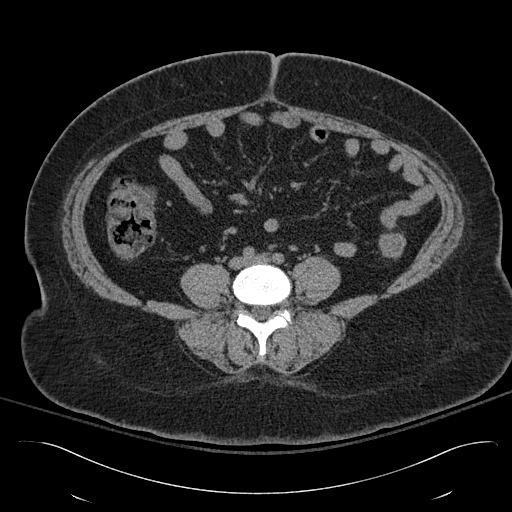
[im 96/164  soft-tissue]
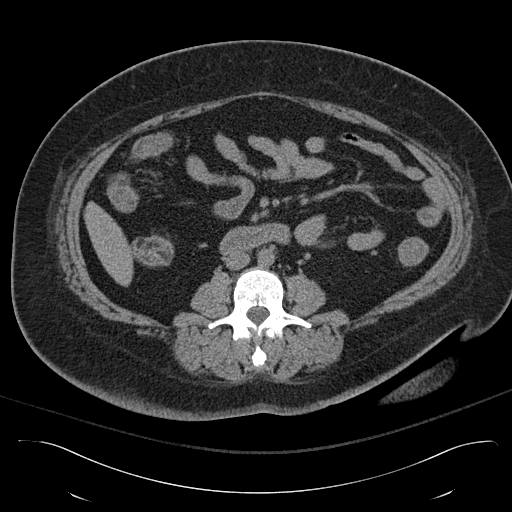
[im 109/164  soft-tissue]
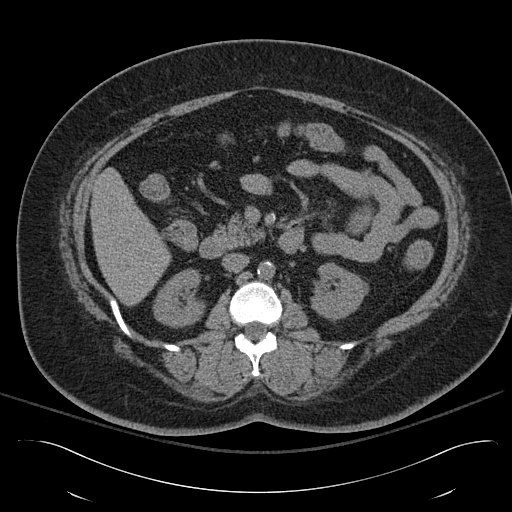
[im 109/164  bone]
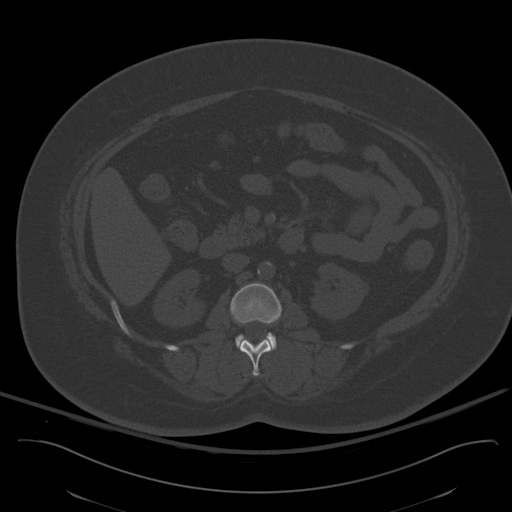
[im 116/164  soft-tissue]
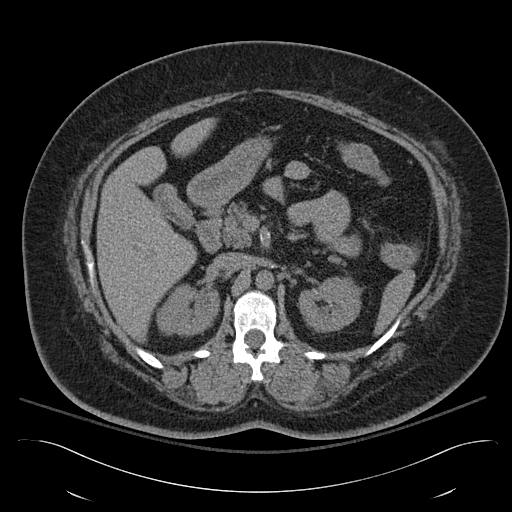
[im 130/164  soft-tissue]
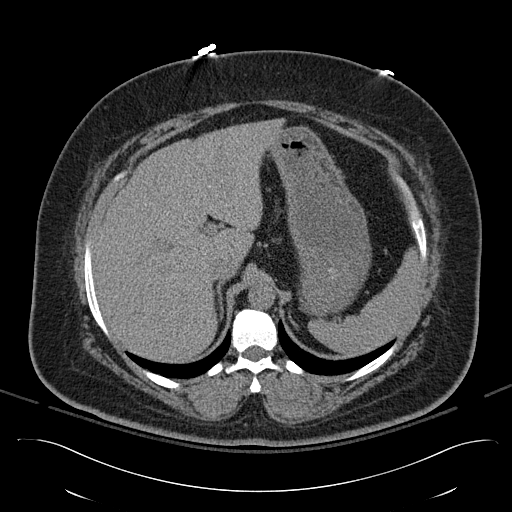
[im 136/164  lung]
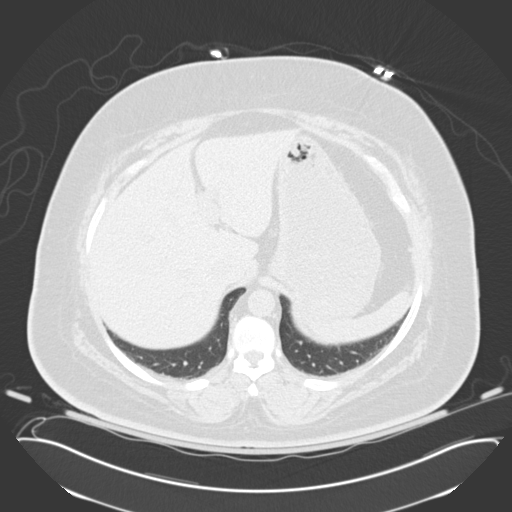
[im 143/164  soft-tissue]
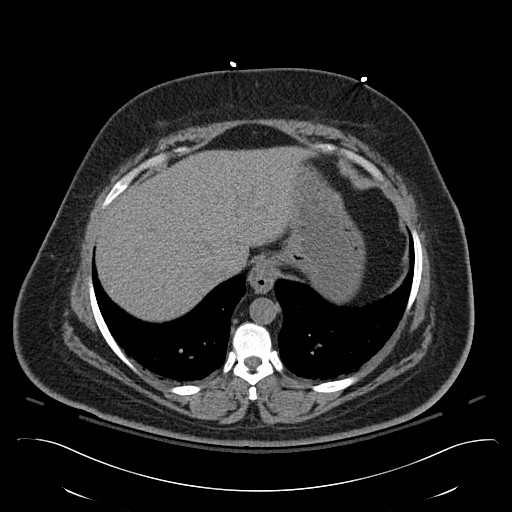
[im 143/164  lung]
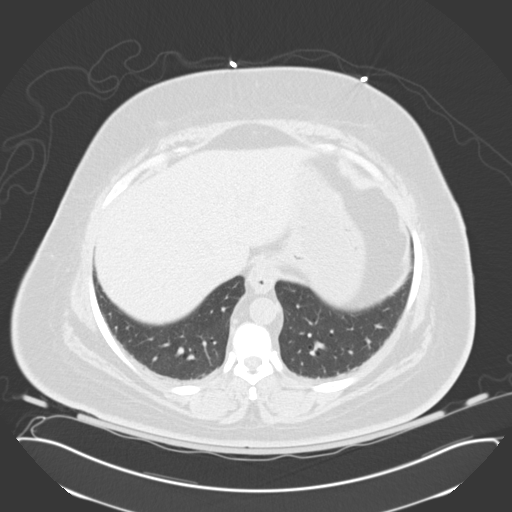
[im 150/164  lung]
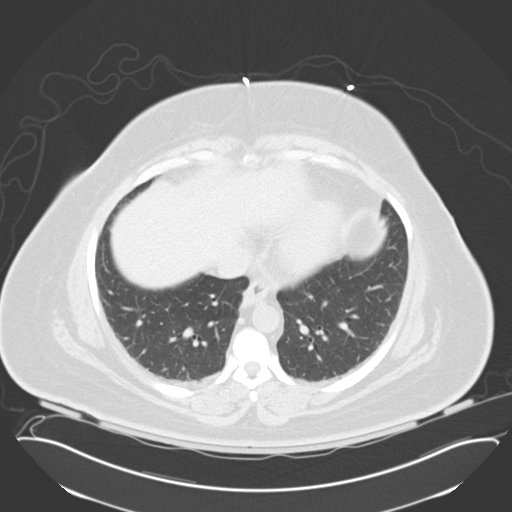
[im 157/164  soft-tissue]
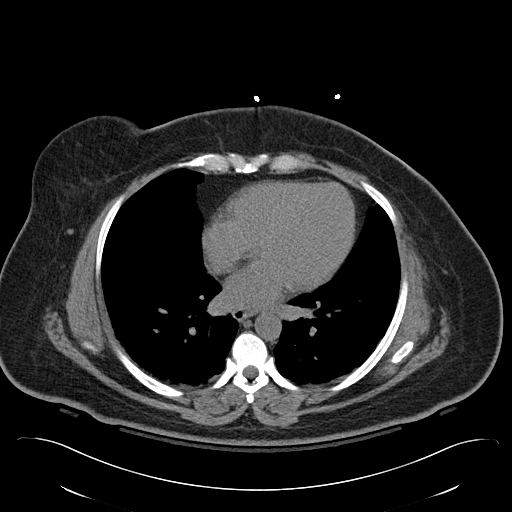
[im 157/164  lung]
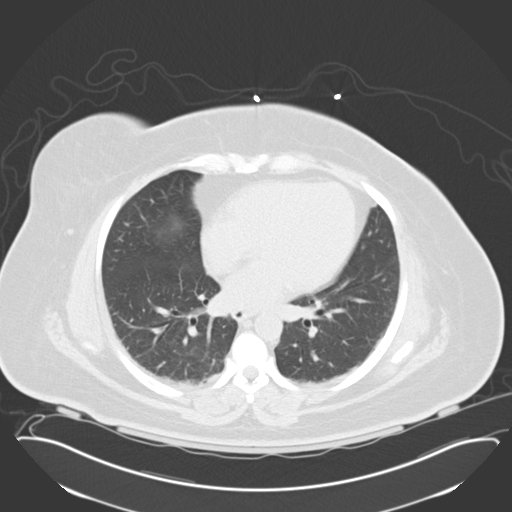

[15 of 32 positions shown; findings below may reference images not displayed]

PROCEDURE:     CT  - CT ABDOMEN AND PELVIS W[DATE]  [DATE]

RESULT:     The study was performed without IV contrast as requested. The
patient is complaining of abdominal discomfort. The liver exhibits normal
density with no evidence of laceration or subcapsular hemorrhage. The
gallbladder is surgically absent. The spleen is normal in density and
contour. The partially distended stomach is normal in appearance. The
pancreas, adrenal glands, and kidneys exhibit no acute abnormality. There is
a punctate calcification in a midpole calyx on the LEFT but there is no
evidence of obstruction. The unopacified loops of small and large bowel are
normal in appearance. The caliber of the abdominal aorta is normal. The
sigmoid colon and rectum are grossly normal. The partially distended urinary
bladder is normal in appearance. The uterus is apparently surgically absent.
I see no adnexal masses. The lung bases are clear. The lumbar vertebral
bodies are preserved in height.
IMPRESSION: I do not see evidence of acute intraabdominal abnormality.

This report was called to the [HOSPITAL] the conclusion of the
study.

## 2008-04-09 IMAGING — CT CT HEAD WITHOUT CONTRAST
2 series · 16 of 30 positions shown, 20 images · non-contrast
Comparison: none

REASON FOR EXAM: syncope
COMMENTS:

[Series 2: without · axial · non-contrast · 0.42mm/px · z∈[+1252,+1372]mm · 13 of 30 slices shown, 17 images]
[im 3/30  brain]
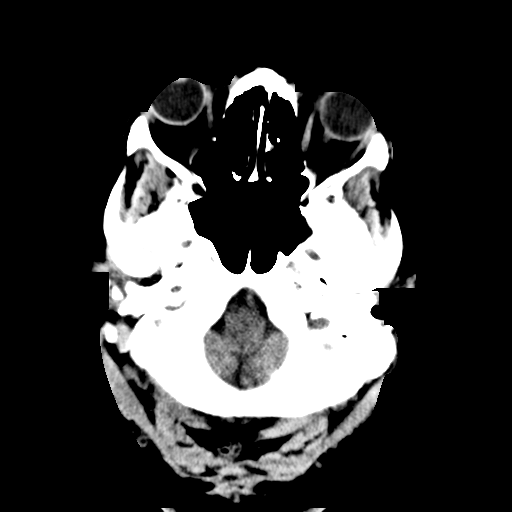
[im 3/30  bone]
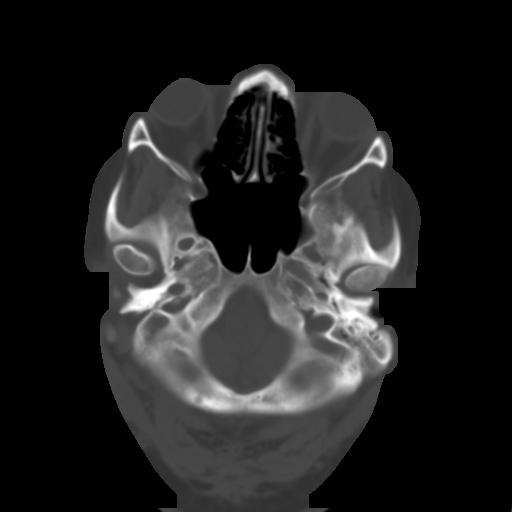
[im 5/30  brain]
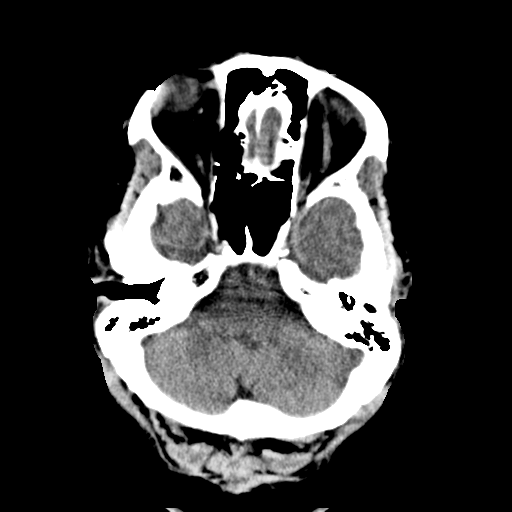
[im 7/30  brain]
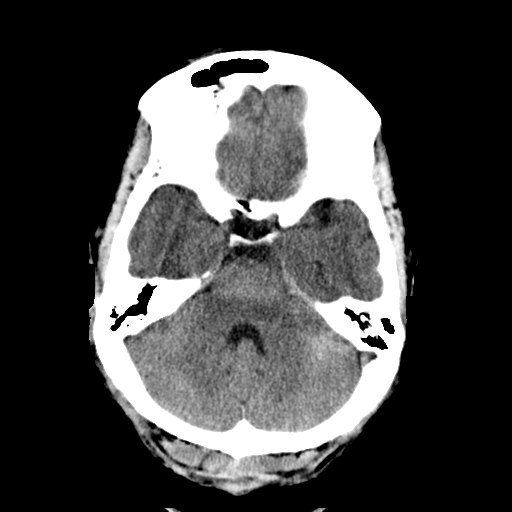
[im 9/30  brain]
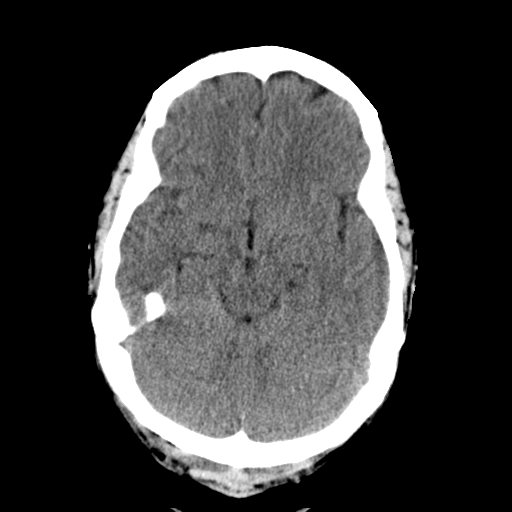
[im 11/30  brain]
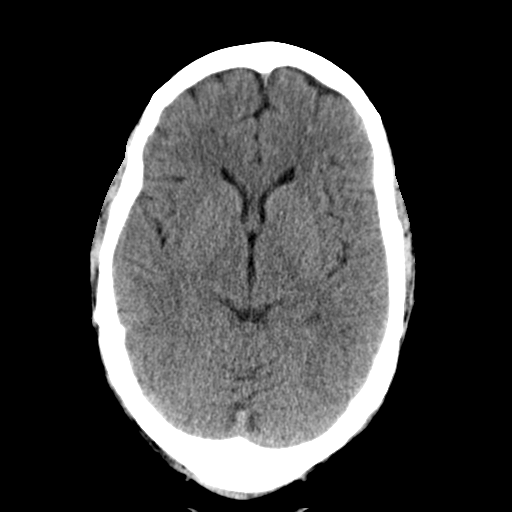
[im 11/30  bone]
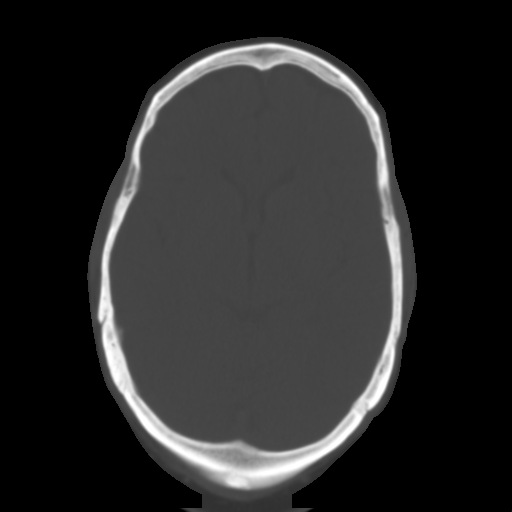
[im 13/30  brain]
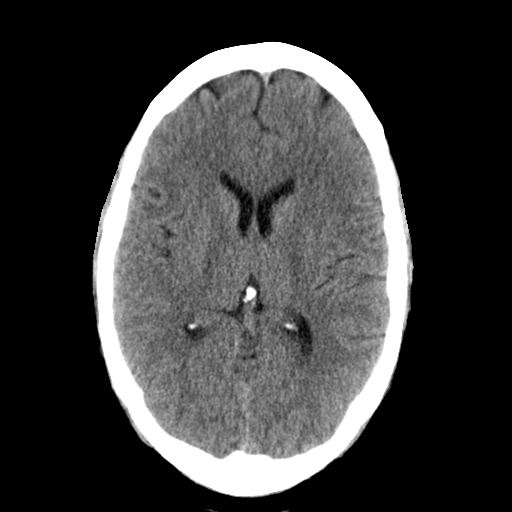
[im 15/30  brain]
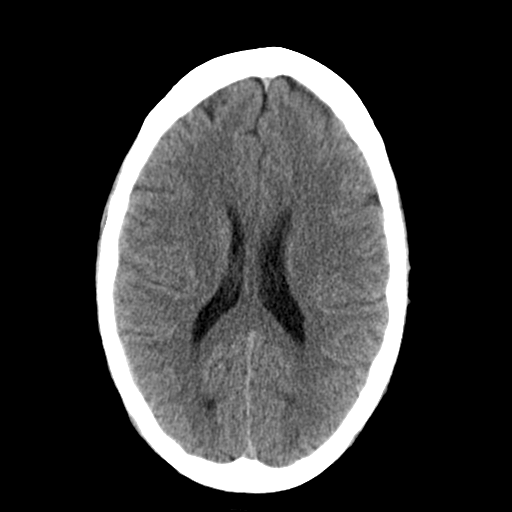
[im 17/30  brain]
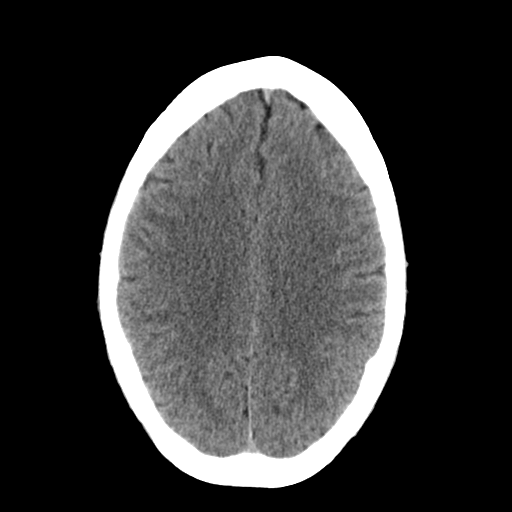
[im 19/30  brain]
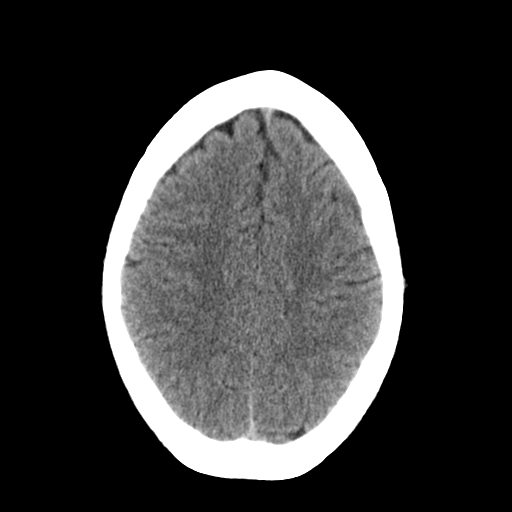
[im 19/30  bone]
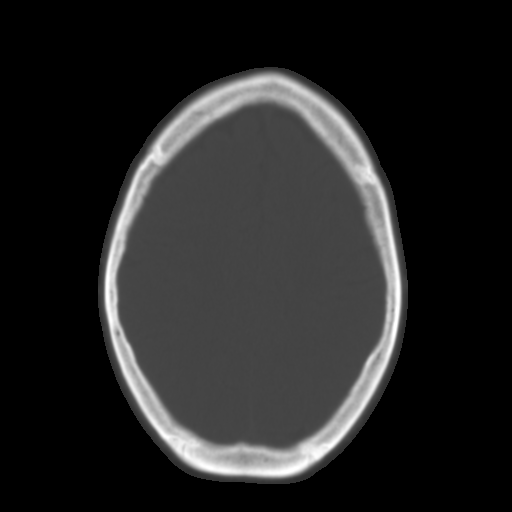
[im 21/30  brain]
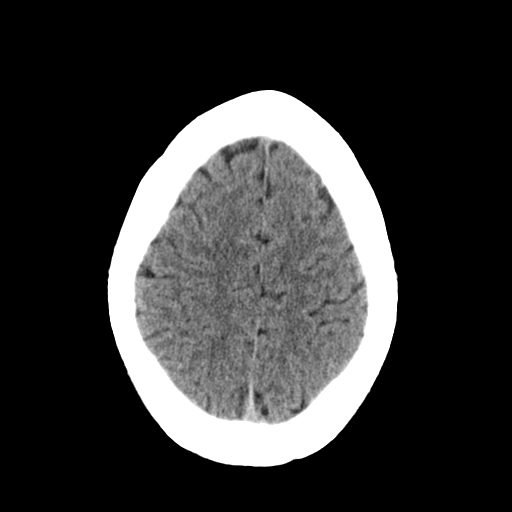
[im 23/30  brain]
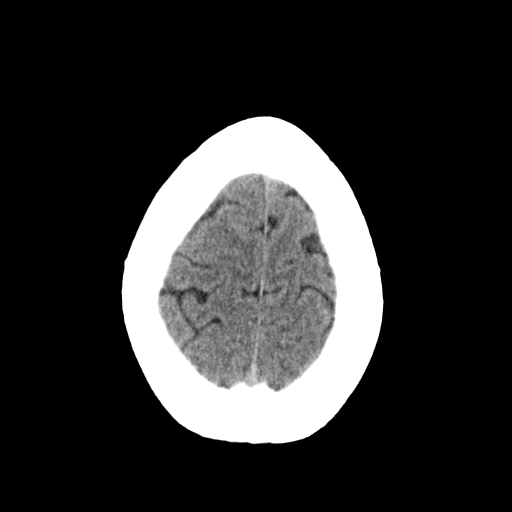
[im 25/30  brain]
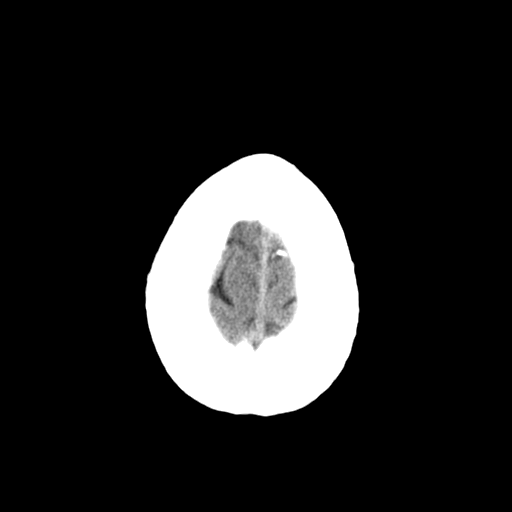
[im 27/30  brain]
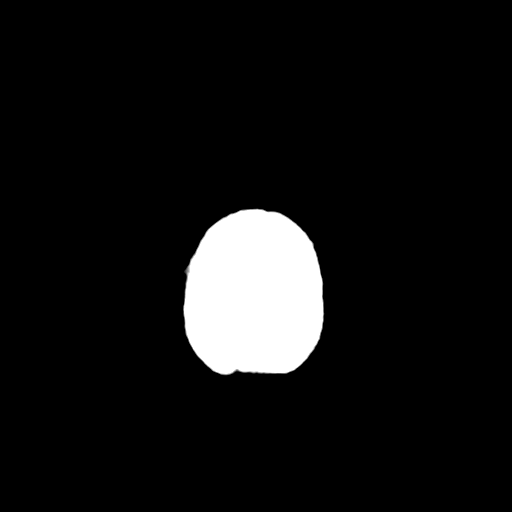
[im 27/30  bone]
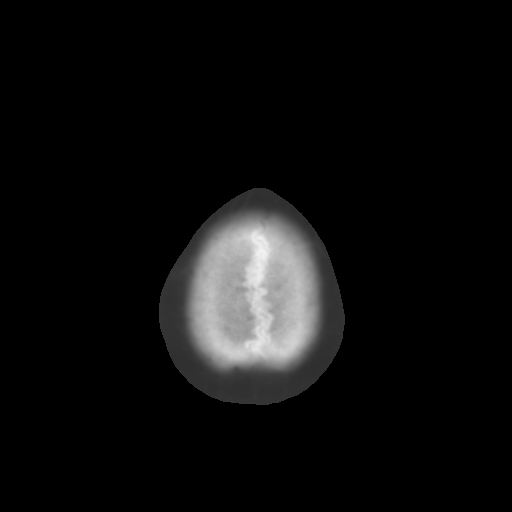

[Series 3: bone · axial · 0.42mm/px · z∈[+1252,+1292]mm · 3 of 30 slices shown]
[im 3/30  bone]
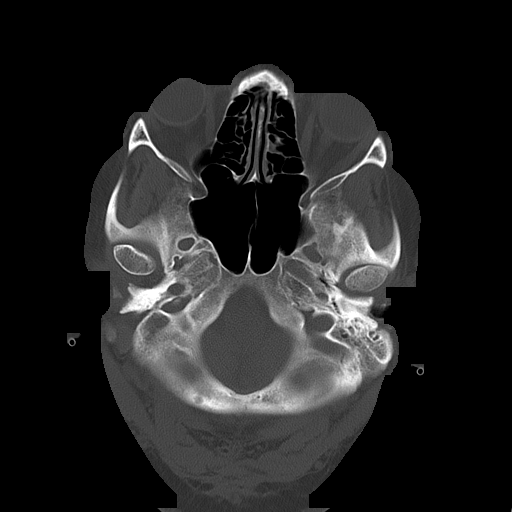
[im 7/30  bone]
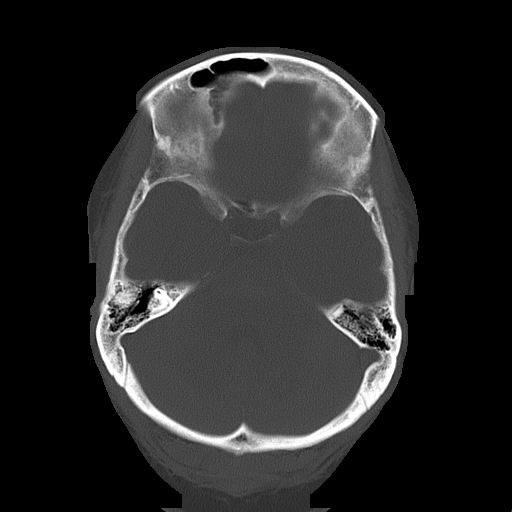
[im 11/30  bone]
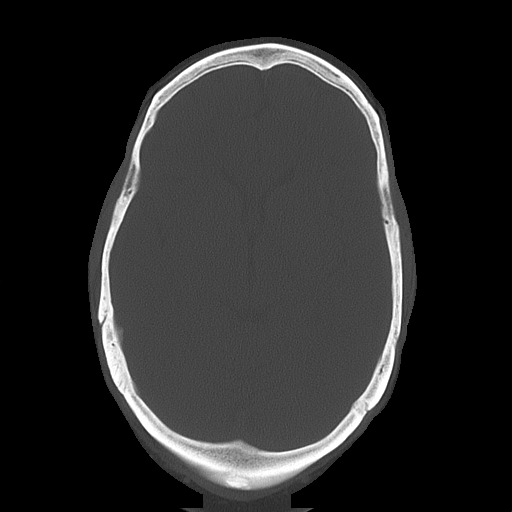

[16 of 30 positions shown; findings below may reference images not displayed]

PROCEDURE:     CT  - CT HEAD WITHOUT CONTRAST  - December 03, 2007  [DATE]

RESULT:     The patient sustained injury in a fall.

Comparison is made to a study 18 August, 2007.

The ventricles are normal in size and position. There is no intracranial
hemorrhage, mass, or mass-effect. The cerebellum and brainstem exhibit
normal density. At bone window settings I do not see evidence of an acute
skull fracture.
IMPRESSION: I see no acute intracranial abnormality.

This report was called to the [HOSPITAL] the conclusion of the
study.

## 2008-04-09 IMAGING — CR DG CHEST 1V PORT
1 series · 1 of 1 positions shown · non-contrast
Comparison: none

REASON FOR EXAM: syncope
COMMENTS:

[view not recorded]
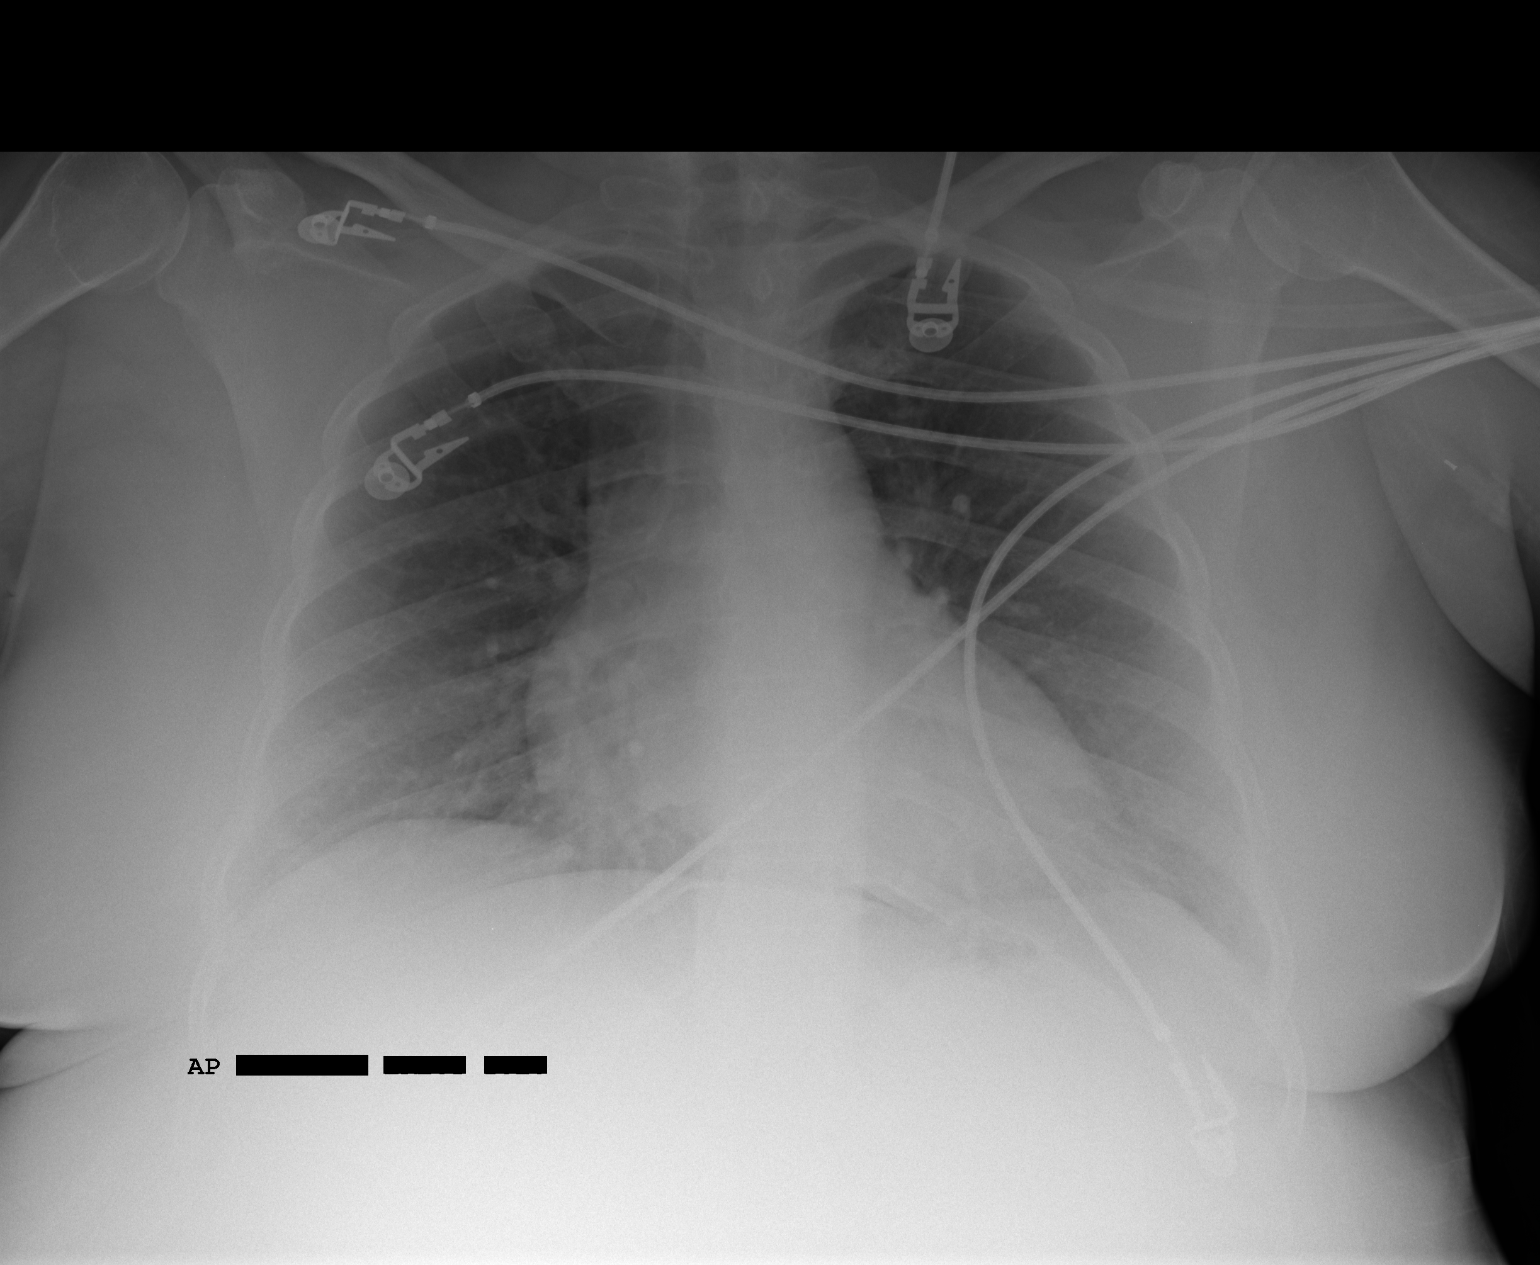

[1 of 1 positions shown; findings below may reference images not displayed]

PROCEDURE:     DXR - DXR PORTABLE CHEST SINGLE VIEW  - December 03, 2007  [DATE]

RESULT:     Comparison is made to study 18 August, 2007.

The cardiac silhouette is top normal in size. The pulmonary vascularity is
not engorged but the perihilar lung markings are mildly prominent. There is
no pleural effusion. The bony thorax is intact where visualized.
IMPRESSION: I do not see definite evidence of acute cardiopulmonary
abnormality. A follow-up PA and lateral chest x-ray would be of value.

## 2008-05-04 ENCOUNTER — Ambulatory Visit: Payer: Self-pay | Admitting: Vascular Surgery

## 2008-08-09 ENCOUNTER — Emergency Department: Payer: Self-pay | Admitting: Emergency Medicine

## 2008-08-28 ENCOUNTER — Inpatient Hospital Stay: Payer: Self-pay | Admitting: Internal Medicine

## 2008-09-09 IMAGING — XA IR VASCULAR PROCEDURE
10 of 12 series · 15 of 24 positions shown · non-contrast
Comparison: none

[Series 3: subtraction in fluoroscopy · 2 of 31 frames shown (1 of 6)]
[frame 5/31]
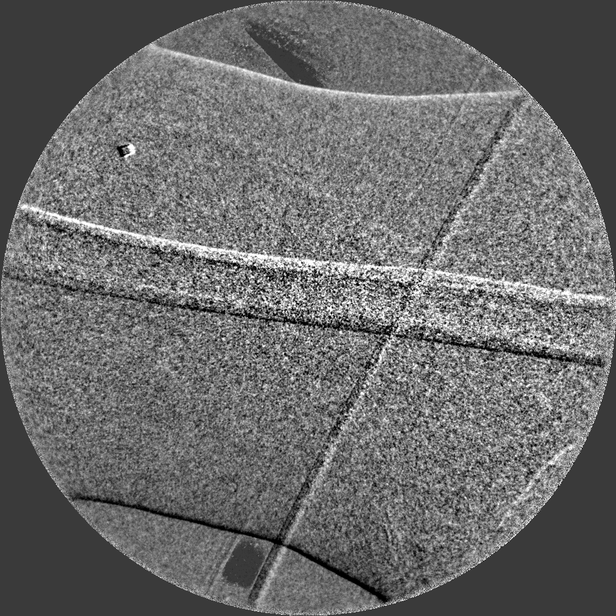
[frame 31/31]
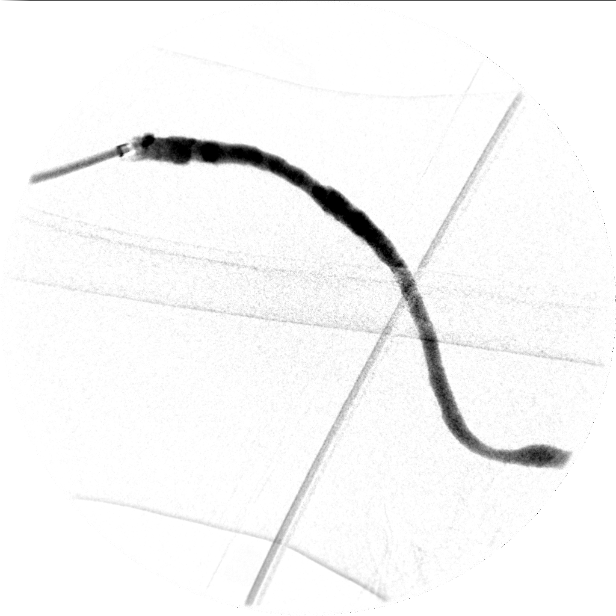

[Series 4: subtraction in fluoroscopy · 2 of 38 frames shown (2 of 6)]
[frame 33/38]
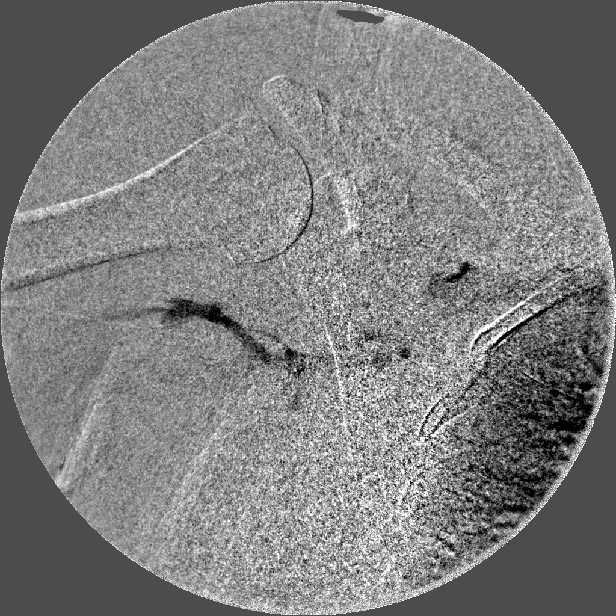
[frame 38/38]
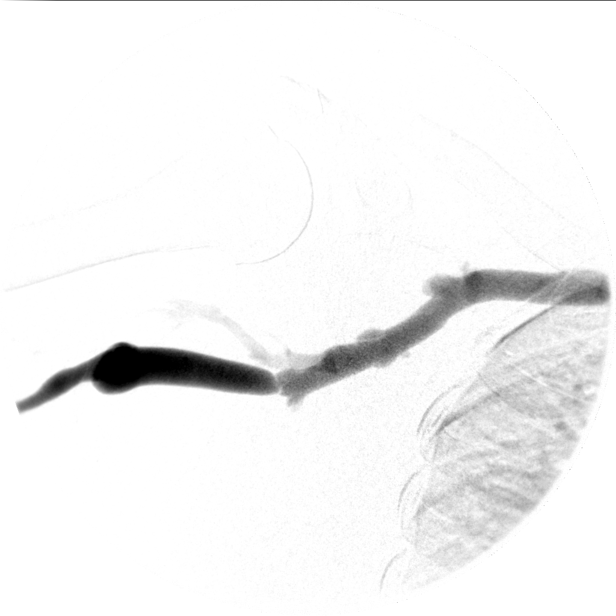

[Series 5: subtraction in fluoroscopy · 2 of 24 frames shown (3 of 6)]
[frame 21/24]
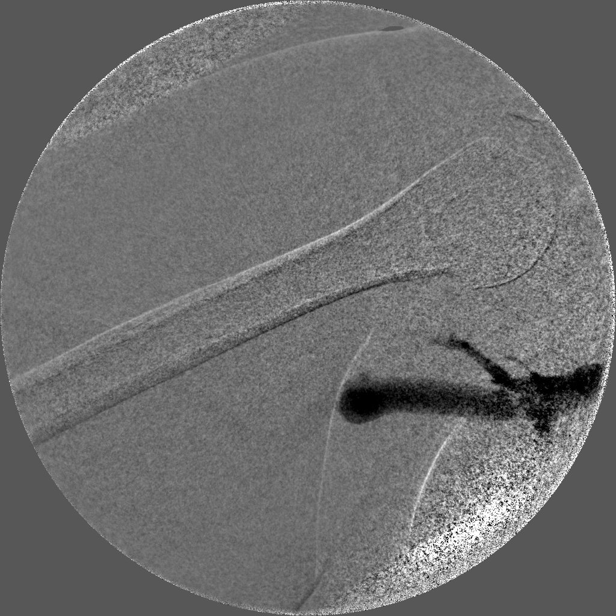
[frame 24/24  full-range]
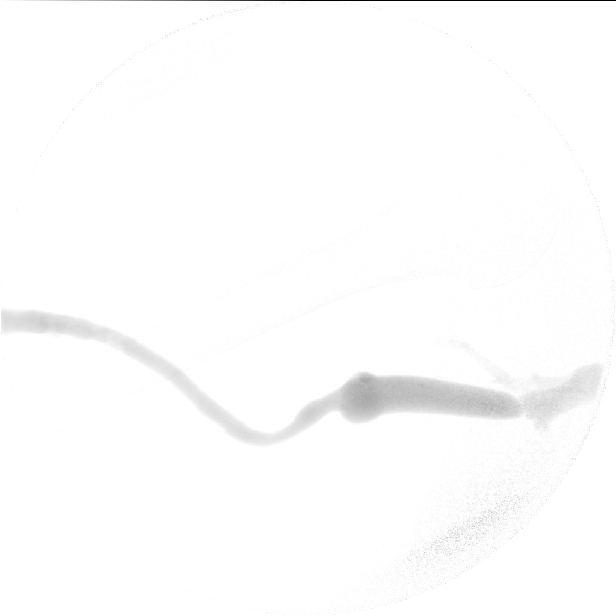

[Series 8: cont. · 1 of 1 slices shown (1 of 4)]
[im 1/1]
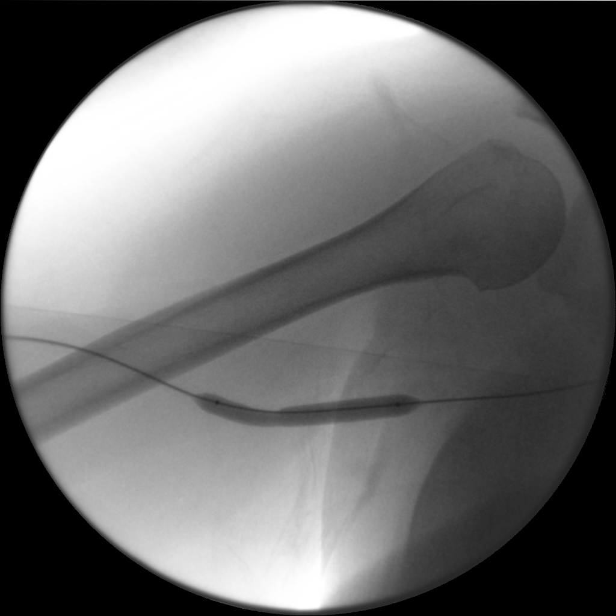

[Series 11: cont. · 1 of 1 slices shown (2 of 4)]
[im 1/1]
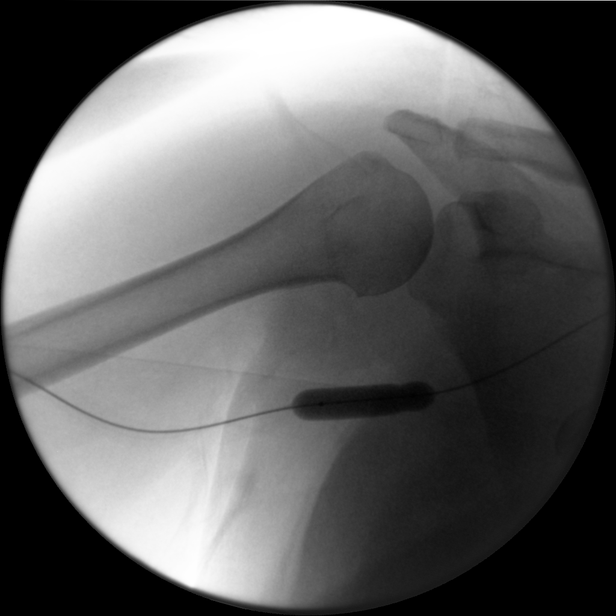

[Series 12: cont. · 1 of 1 slices shown (3 of 4)]
[im 1/1]
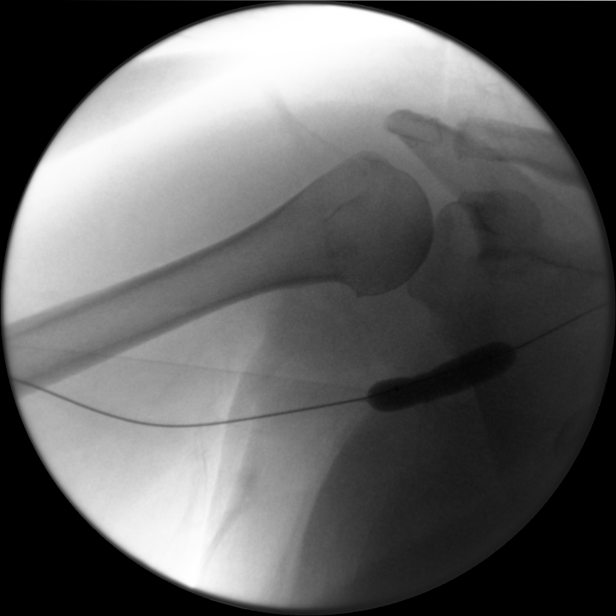

[Series 13: subtraction in fluoroscopy · 2 of 45 frames shown (4 of 6)]
[frame 39/45]
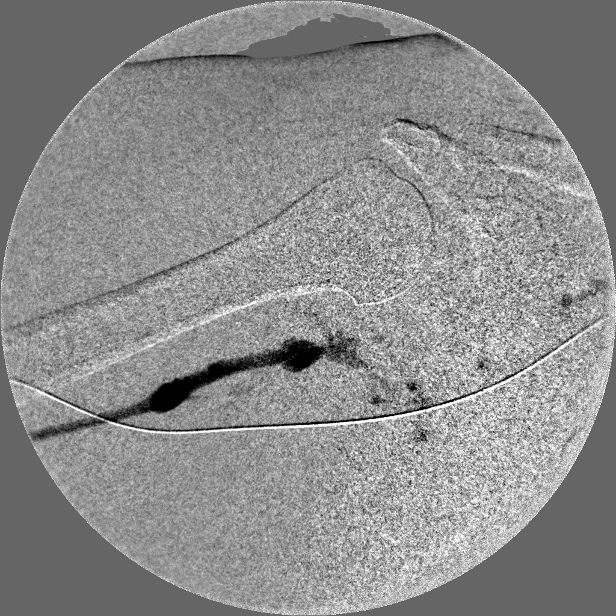
[frame 45/45  full-range]
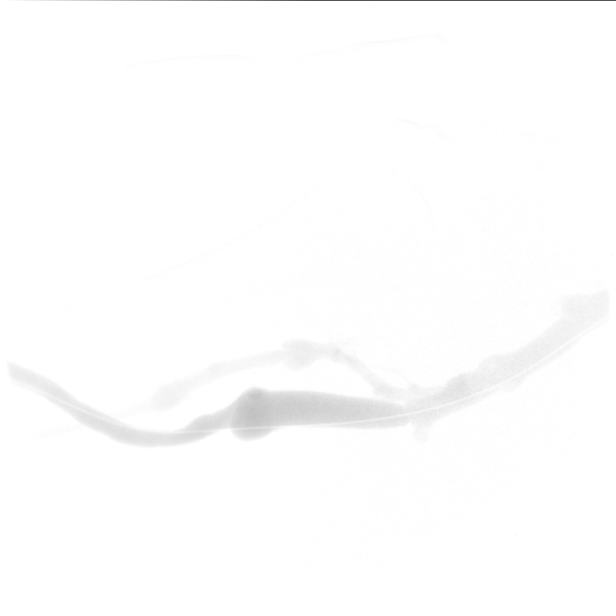

[Series 15: subtraction in fluoroscopy · 1 of 37 frames shown (5 of 6)]
[frame 32/37]
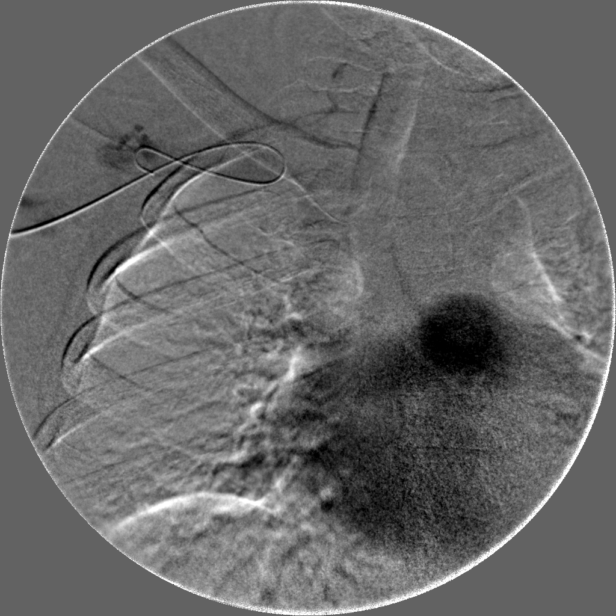

[Series 17: cont. · 1 of 1 slices shown (4 of 4)]
[im 1/1]
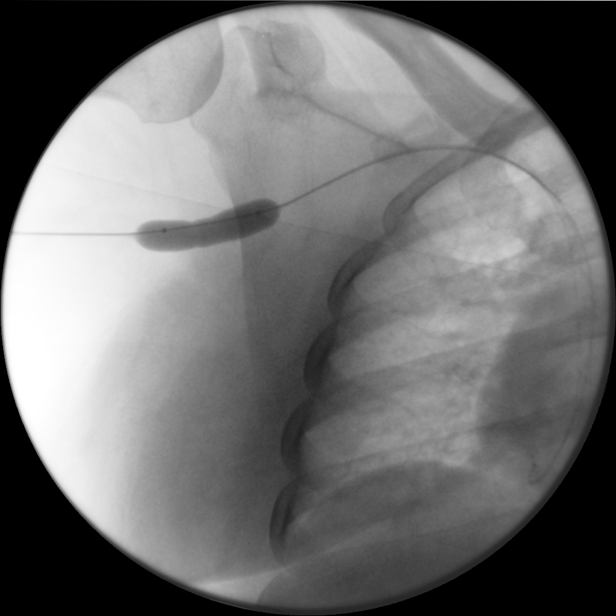

[Series 18: subtraction in fluoroscopy · 2 of 37 frames shown (6 of 6)]
[frame 6/37]
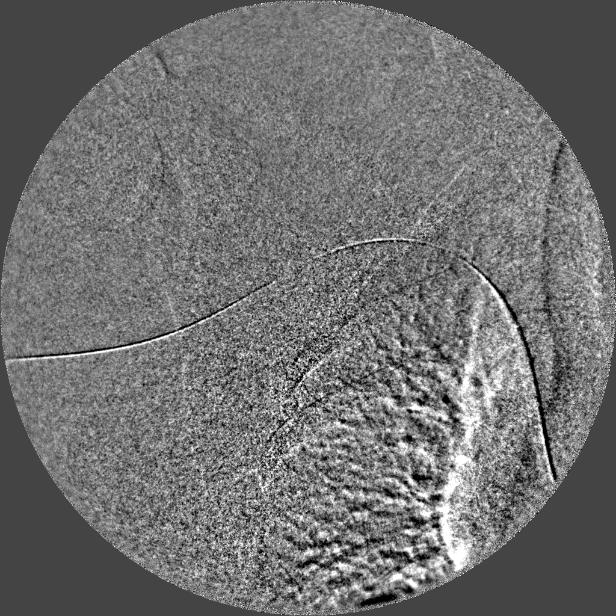
[frame 37/37]
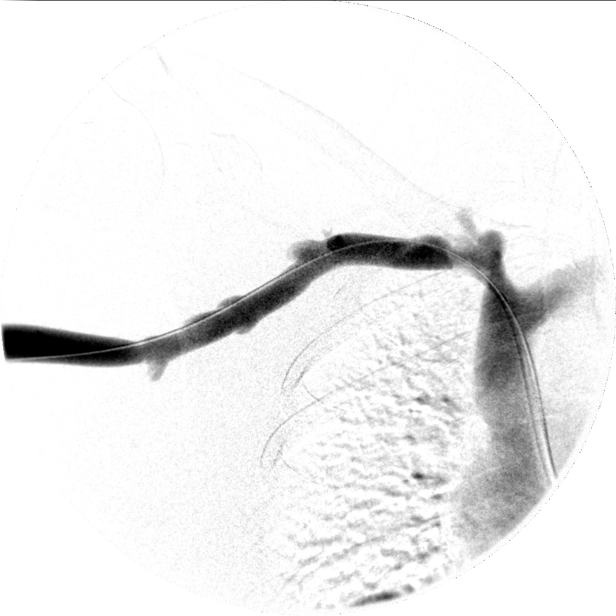

[15 of 24 positions shown; findings below may reference images not displayed]

IMAGES IMPORTED FROM THE [employer] WORK[employer]OW SYSTEM
NO DICTATION FOR STUDY
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
## 2009-01-03 IMAGING — CR DG ABDOMEN 3V
1 series · 4 of 4 positions shown · non-contrast
Comparison: none

REASON FOR EXAM: vomiting  cough
COMMENTS:

[Series 1: view not recorded · 0.17mm/px · 4 of 4 slices shown]
[im 1/4]
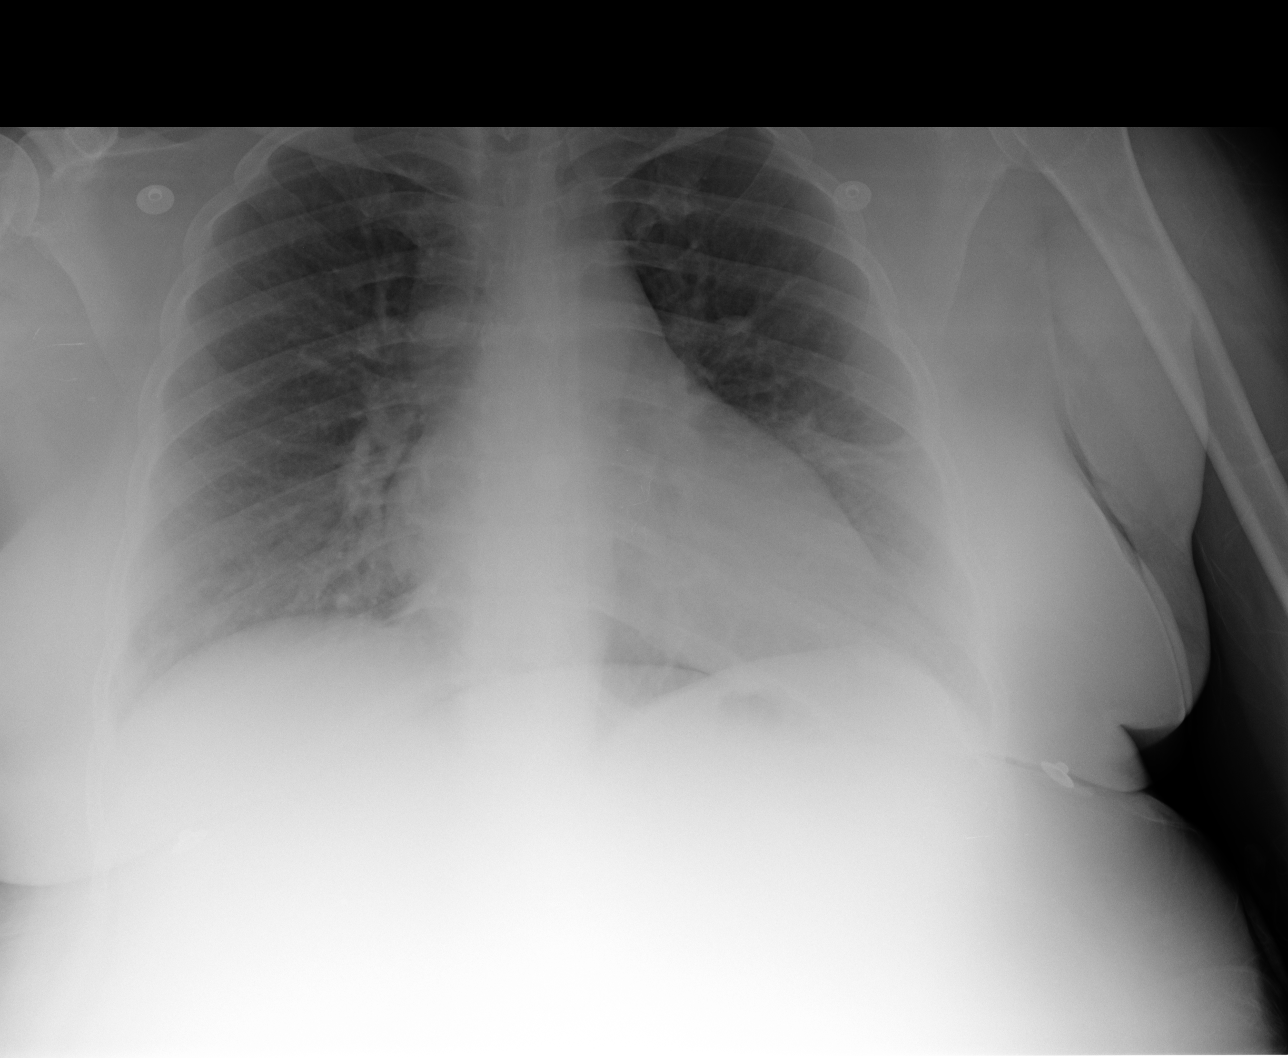
[im 2/4]
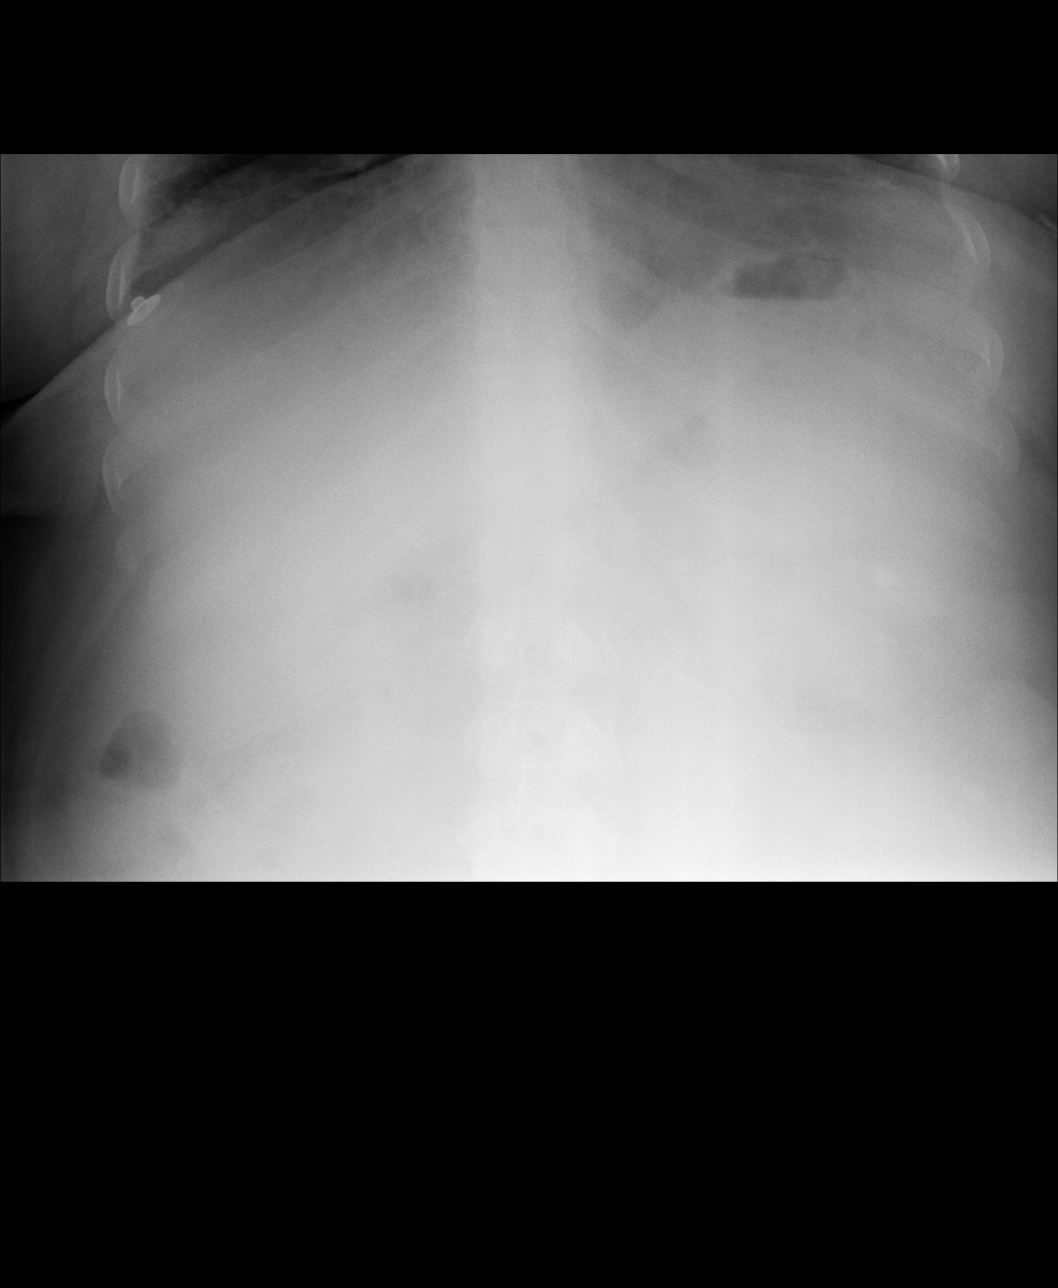
[im 3/4]
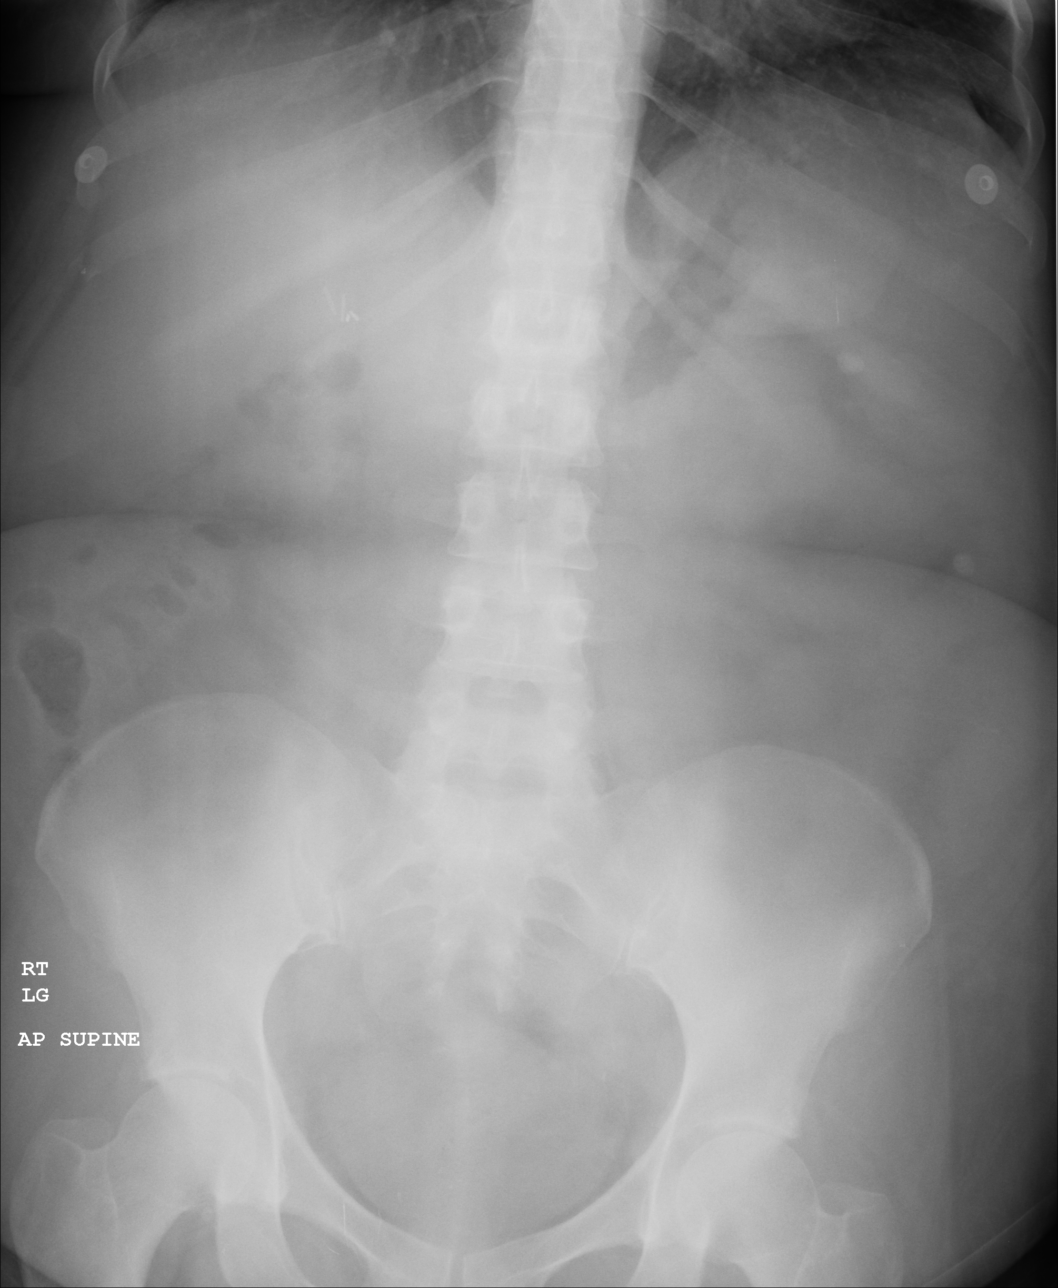
[im 4/4]
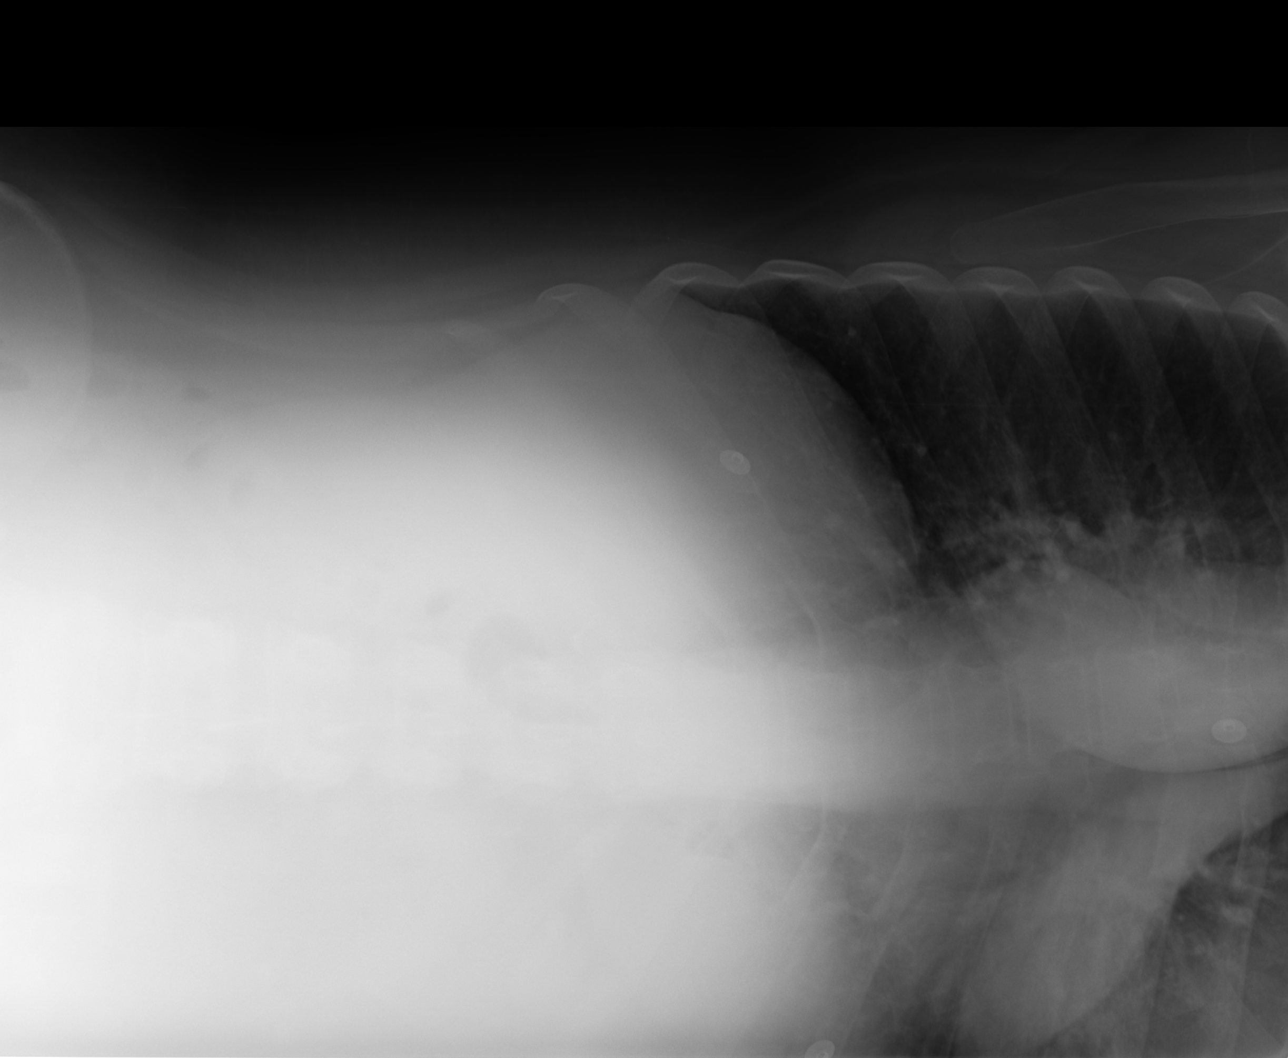

[4 of 4 positions shown; findings below may reference images not displayed]

PROCEDURE:     DXR - DXR ABDOMEN 3-WAY (INCL PA CXR)  - August 28, 2008  [DATE]

RESULT:     The patient is reporting cough as well as nausea and vomiting.

The chest film reveals the cardiac silhouette to be top normal to slightly
enlarged. The pulmonary interstitial markings are increased. I see no
discrete alveolar infiltrate but the lung markings are coarse in the LEFT
midlung. Views of the abdomen reveal a relatively nonspecific bowel gas
pattern with no evidence of obstruction or ileus. There are surgical clips
in the gallbladder fossa. There are coarse rounded calcific densities that
may lie within the spleen on the LEFT. These appear somewhat more lateral
than would be expected for kidney stones but I cannot absolutely exclude the
possibility of their being stones. Review of a study 02 December, 2007
revealed calcifications in the same location.
IMPRESSION: 1. I do not see evidence of bowel obstruction or ileus.
2. I cannot exclude calcifications in the spleen or less likely in the LEFT
kidney.
3. The interstitial markings of both lungs are increased, there are
confluent lung markings in the LEFT midlung, and the cardiac silhouette is
borderline enlarged. These findings may reflect an element of low-grade CHF
with superimposed acute bronchitis and subsegmental atelectasis. A followup
PA and lateral chest x-ray would be of value.

## 2009-01-25 ENCOUNTER — Inpatient Hospital Stay: Payer: Self-pay | Admitting: Internal Medicine

## 2009-06-02 ENCOUNTER — Inpatient Hospital Stay: Payer: Self-pay | Admitting: Internal Medicine

## 2009-06-02 IMAGING — CR DG ABDOMEN 2V
1 series · 4 of 4 positions shown · non-contrast
Comparison: none

REASON FOR EXAM: abd pain vomiting
COMMENTS:

[Series 1: view not recorded · 0.17mm/px · 4 of 4 slices shown]
[im 1/4]
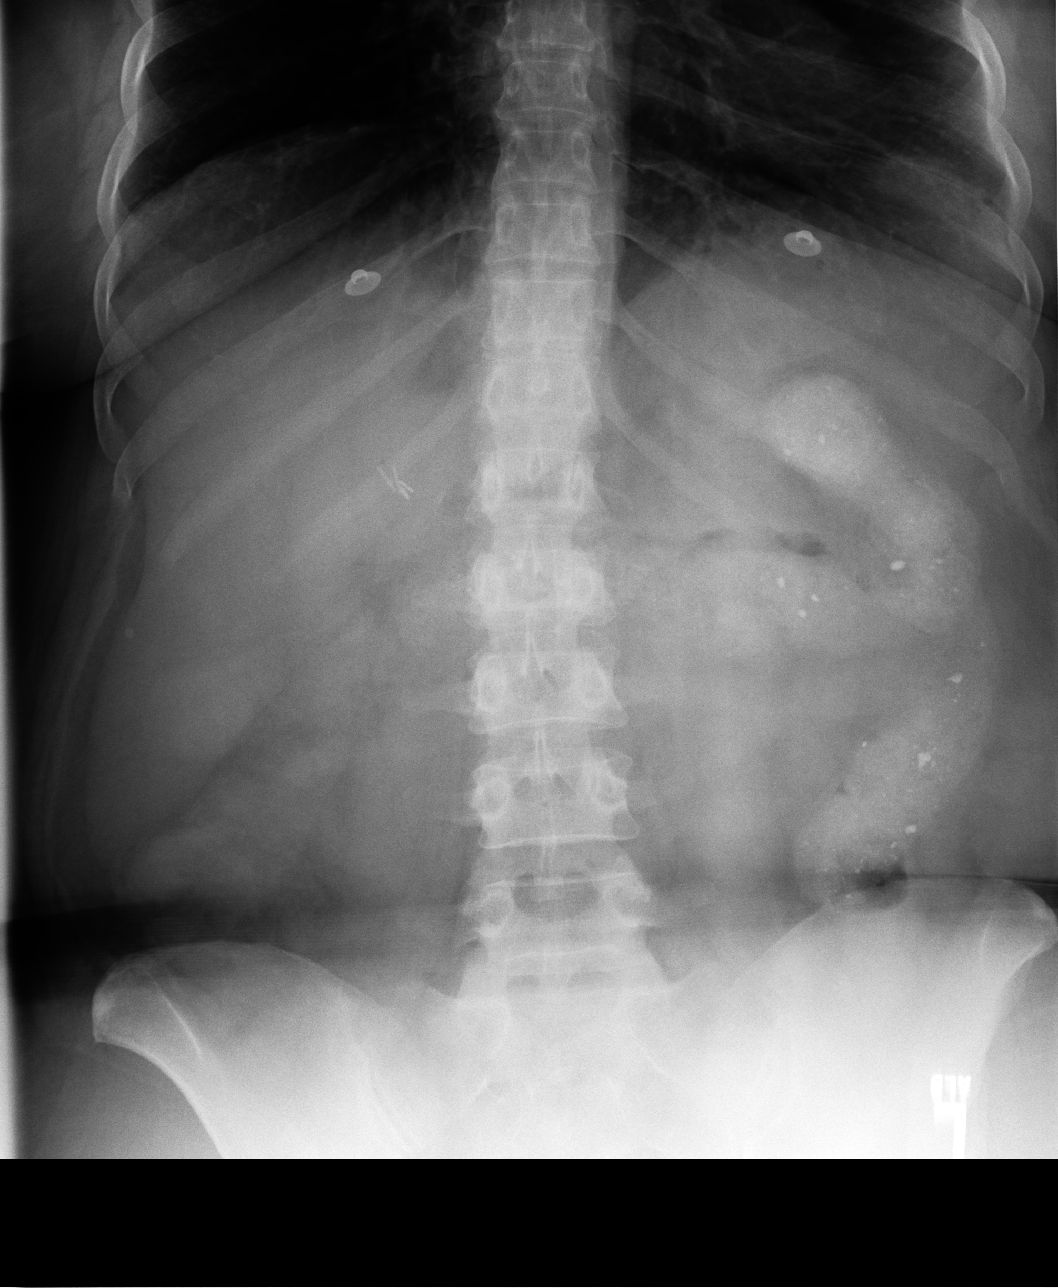
[im 2/4]
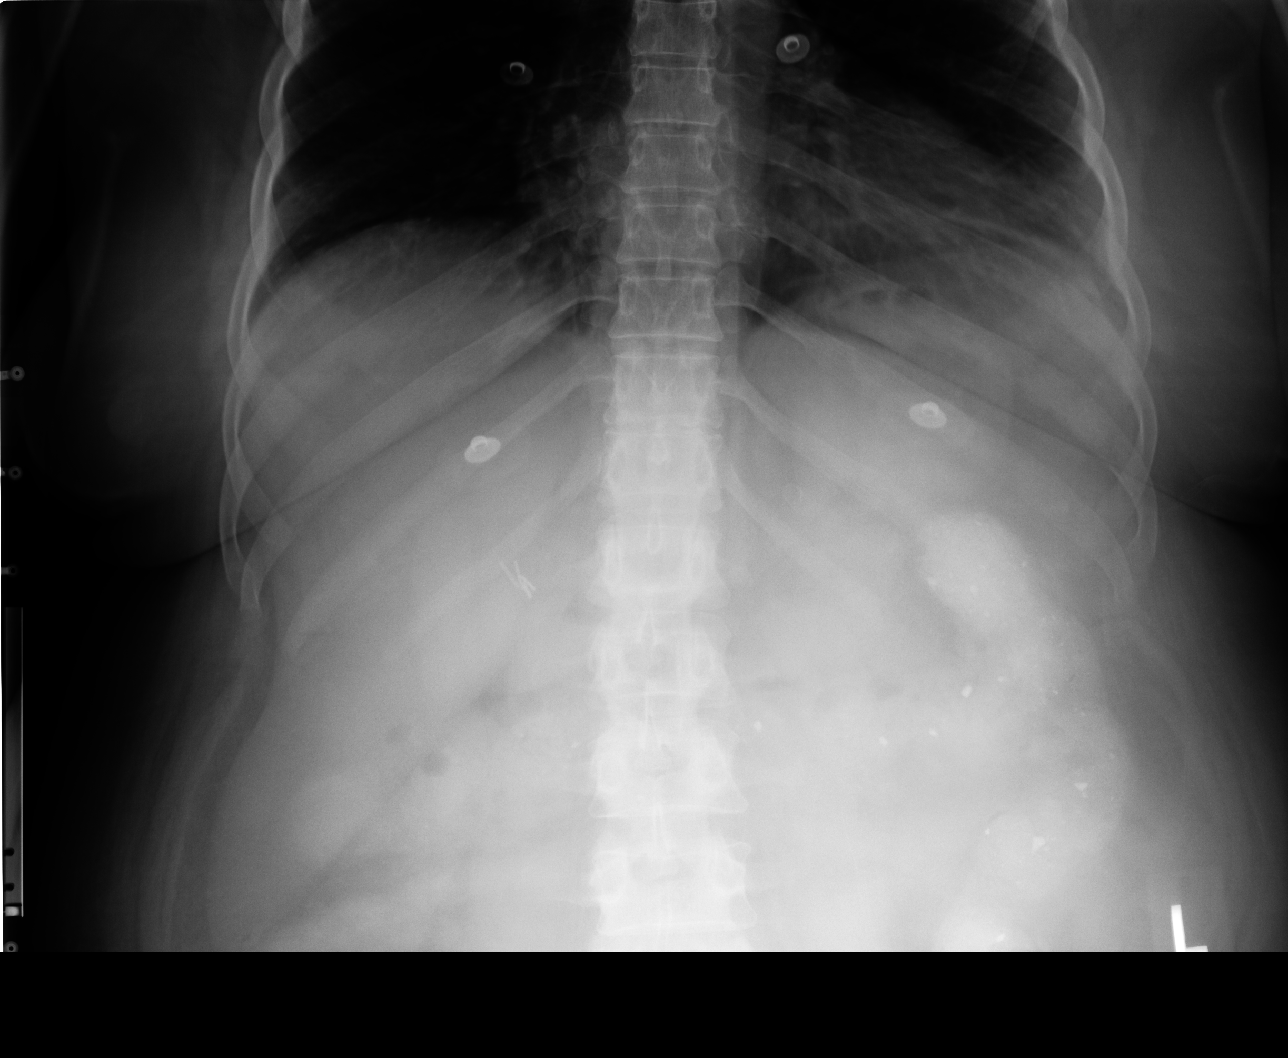
[im 3/4]
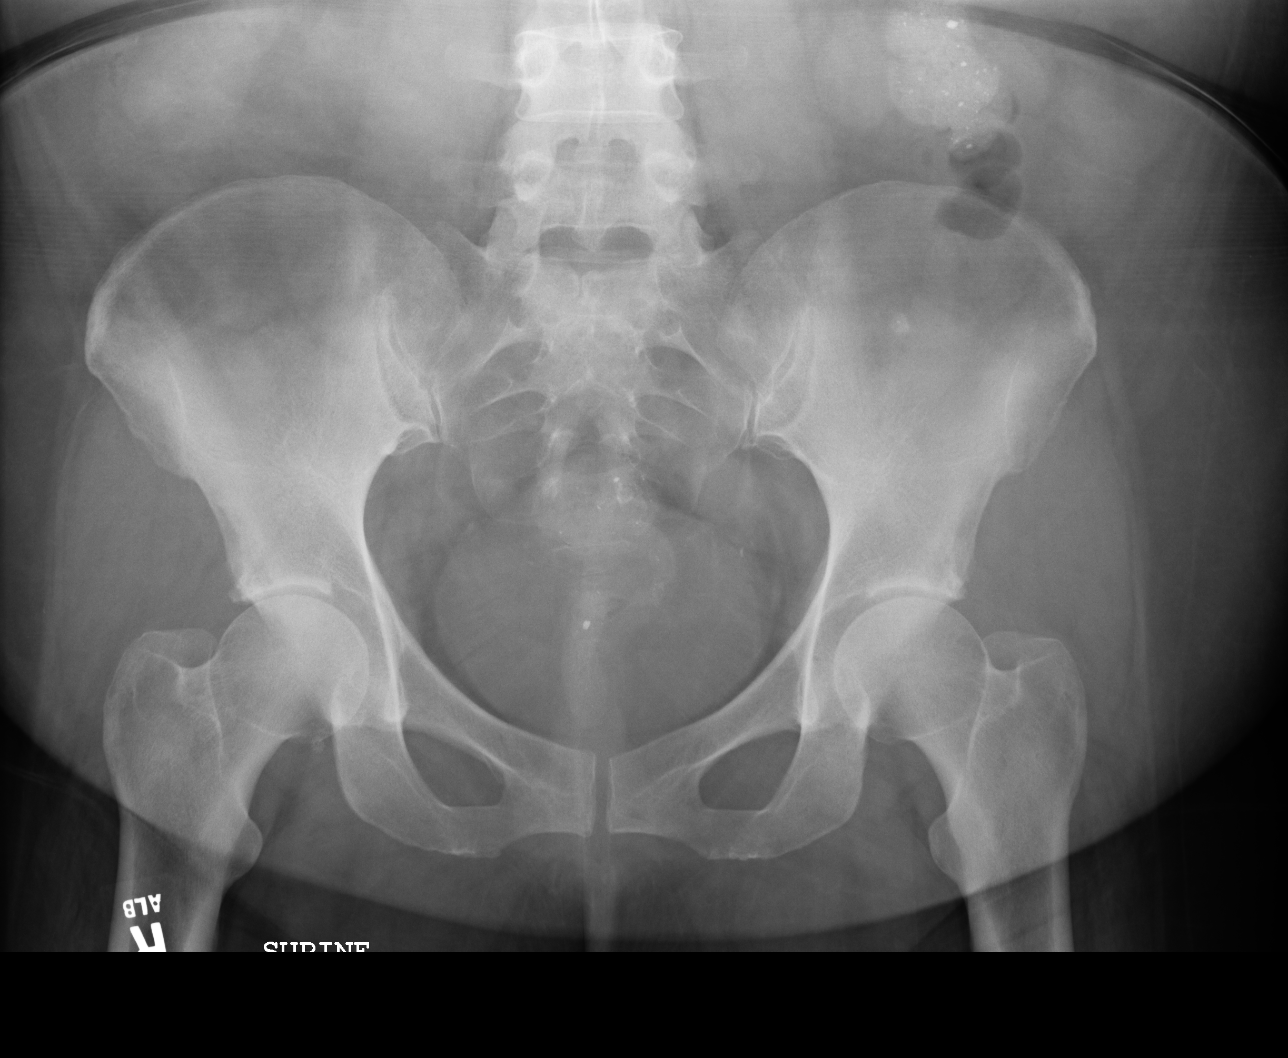
[im 4/4]
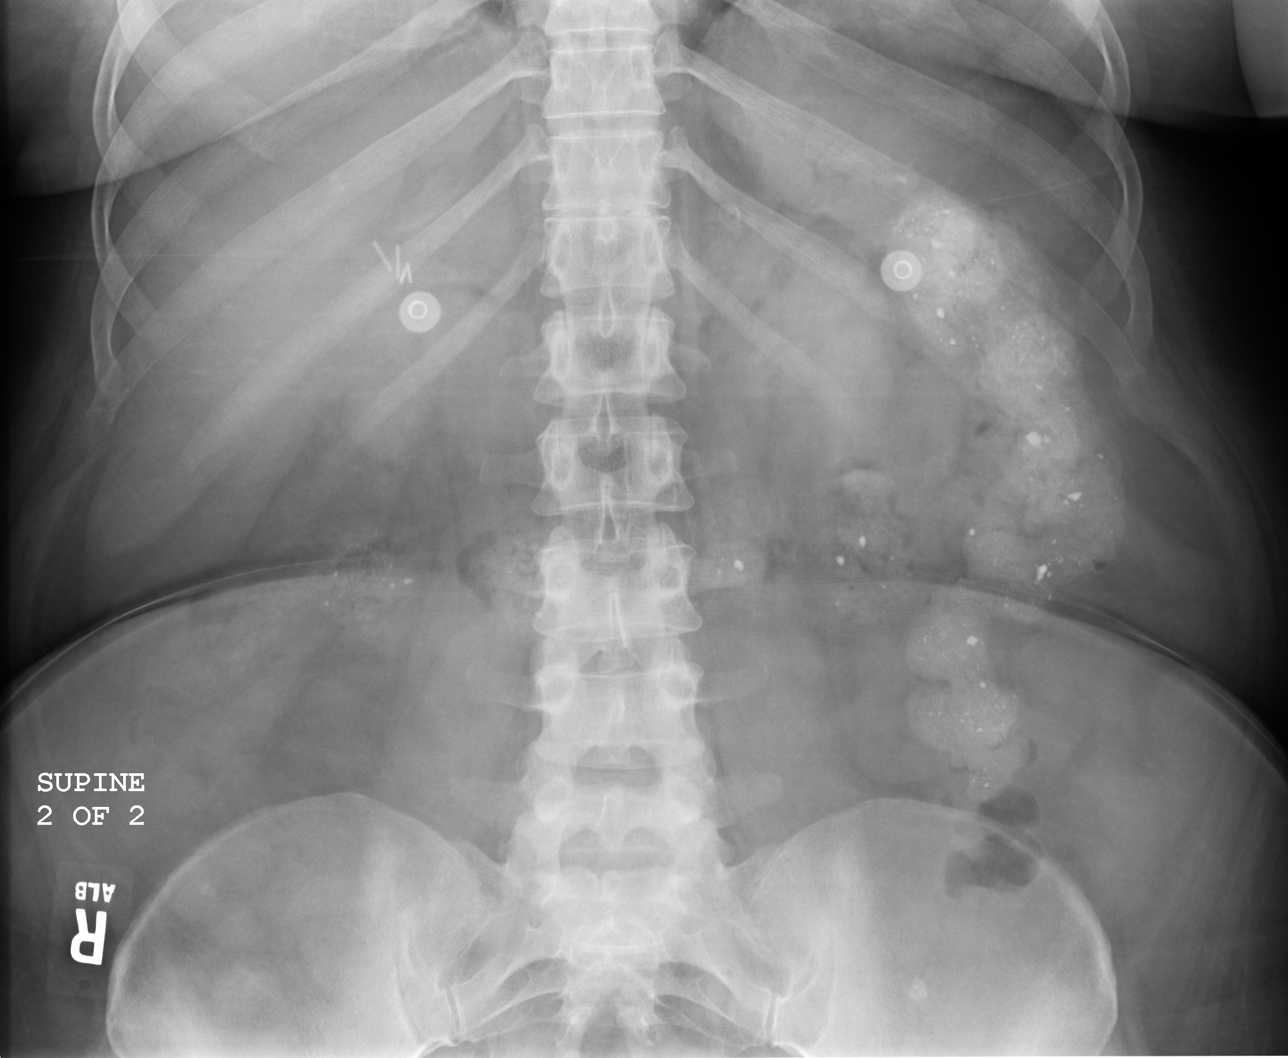

[4 of 4 positions shown; findings below may reference images not displayed]

PROCEDURE:     DXR - DXR ABDOMEN 2 V FLAT AND ERECT  - January 25, 2009  [DATE]

RESULT:     The bowel gas pattern exhibits no evidence of obstruction. There
is radiodense contrast material within the colon. There is no free
extraluminal gas. The stomach does not appear abnormally distended. There
are surgical clips in the gallbladder fossa. The bony structures exhibit no
acute abnormality.
IMPRESSION: There is radiodense material within the colon. Has the
patient undergone a recent barium study or CT scan? I do not see evidence of
bowel obstruction nor other acute intra-abdominal abnormality. Followup CT
scanning is available if felt clinically indicated.

## 2009-06-02 IMAGING — CT CT ABD-PELV W/ CM
1 of 2 series · 16 of 32 positions shown, 20 images · non-contrast
Comparison: none

REASON FOR EXAM: (1) abd pain; (2) abd pain
COMMENTS:

PROCEDURE:     CT  - CT ABDOMEN / PELVIS  W  - January 25, 2009  [DATE]
RESULT:
HISTORY: Nausea and vomiting.

[Series 2: abdomen · axial · 0.72mm/px · z∈[+6,+426]mm · 16 of 92 slices shown, 20 images]
[im 4/92  soft-tissue]
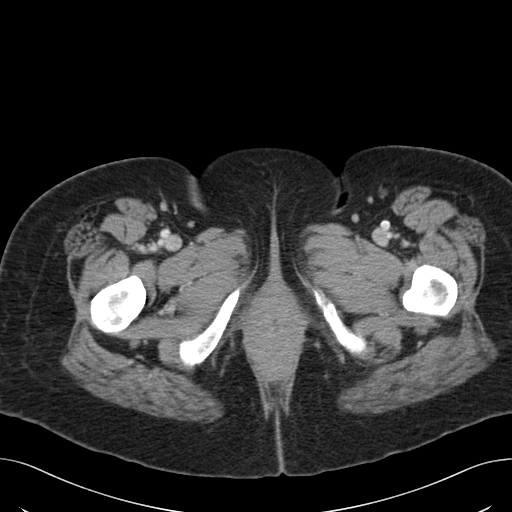
[im 4/92  bone]
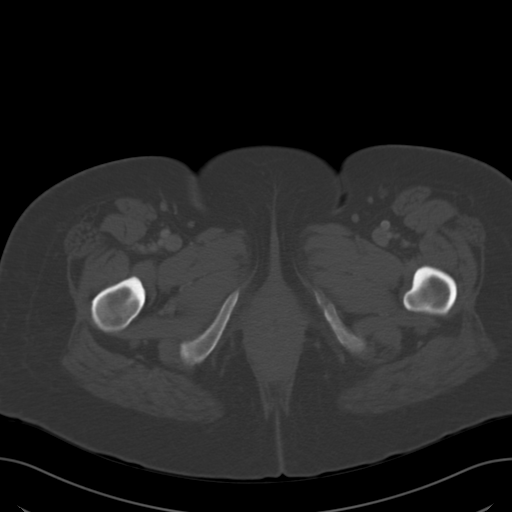
[im 11/92  soft-tissue]
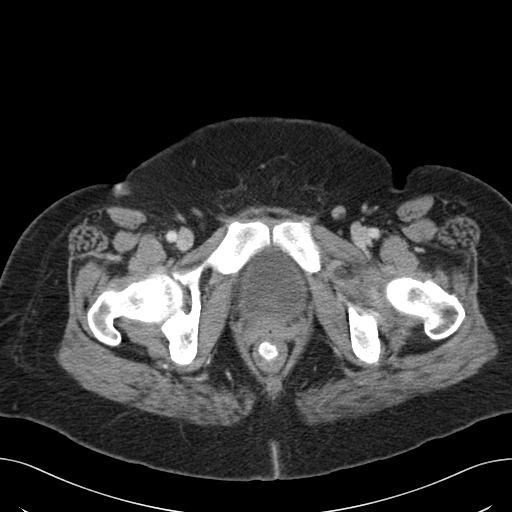
[im 19/92  soft-tissue]
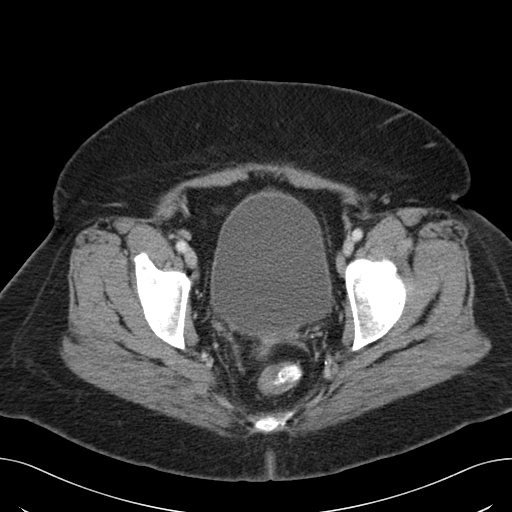
[im 26/92  soft-tissue]
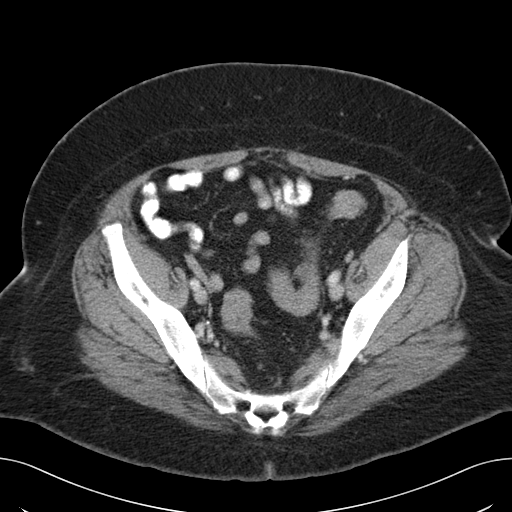
[im 30/92  soft-tissue]
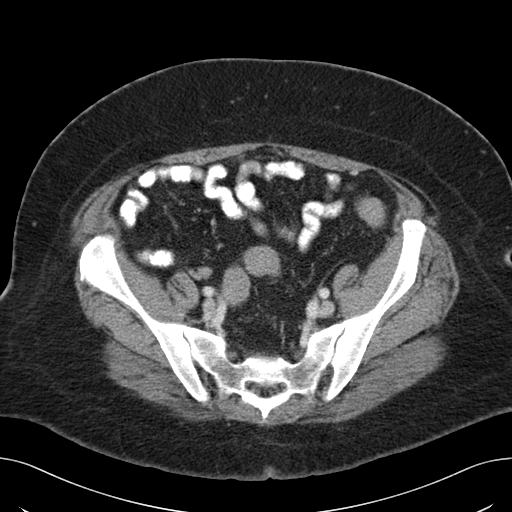
[im 37/92  soft-tissue]
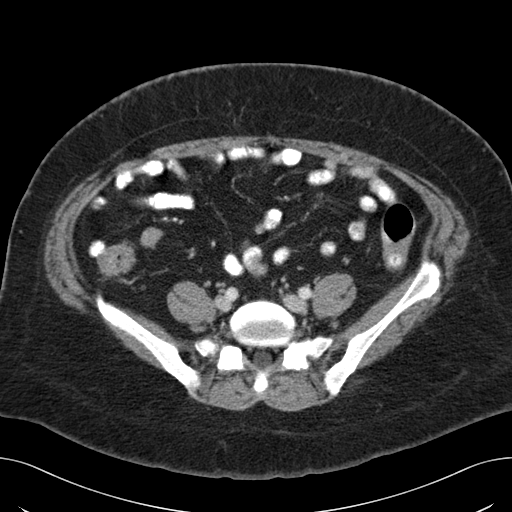
[im 44/92  soft-tissue]
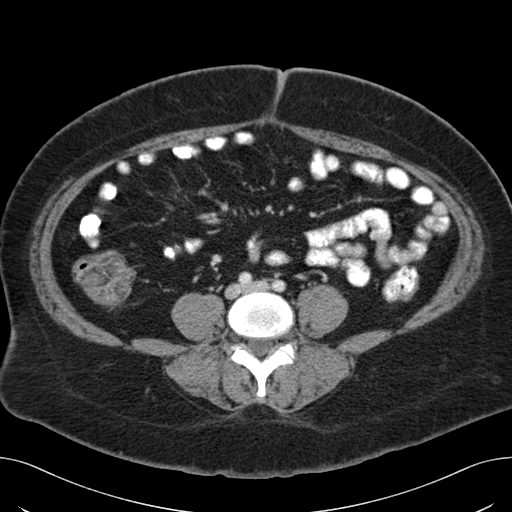
[im 48/92  soft-tissue]
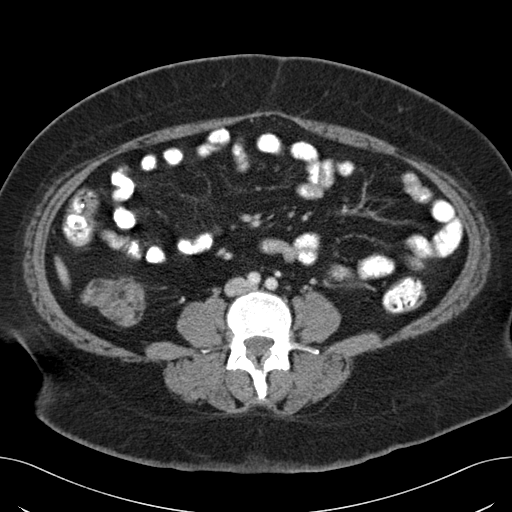
[im 55/92  soft-tissue]
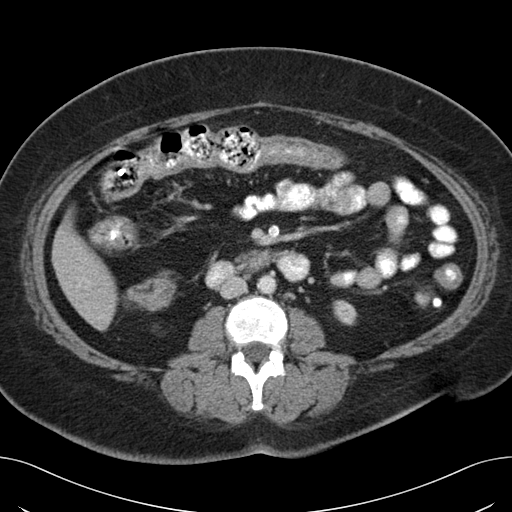
[im 55/92  bone]
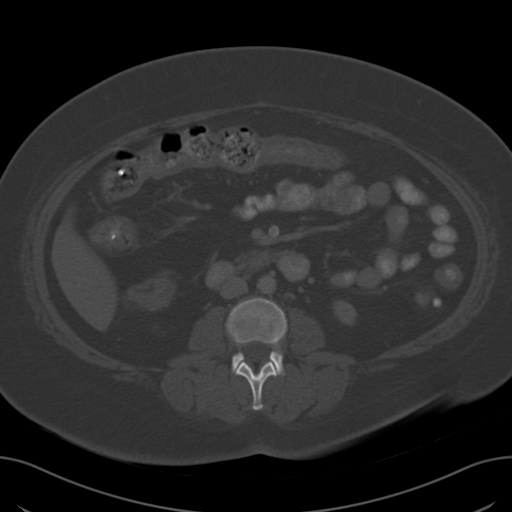
[im 62/92  soft-tissue]
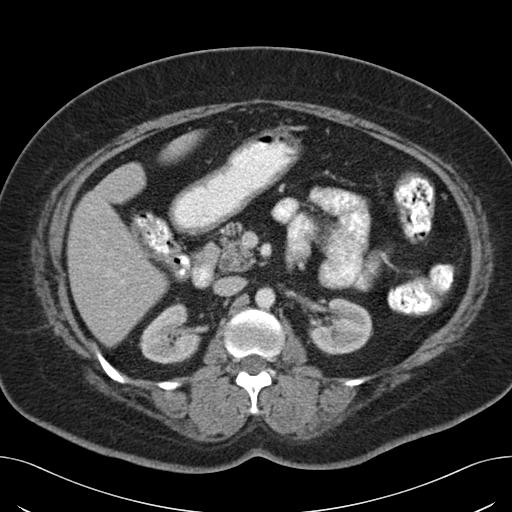
[im 70/92  soft-tissue]
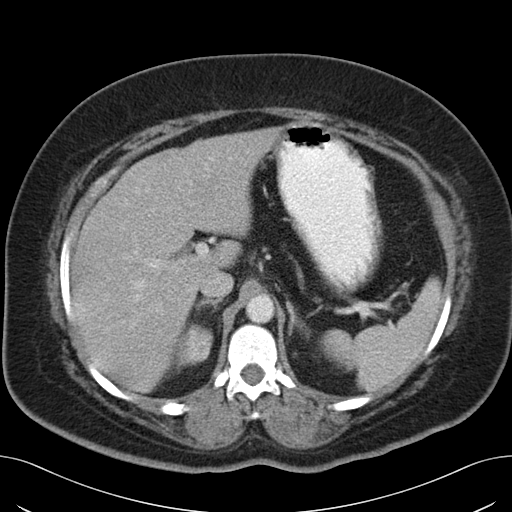
[im 73/92  soft-tissue]
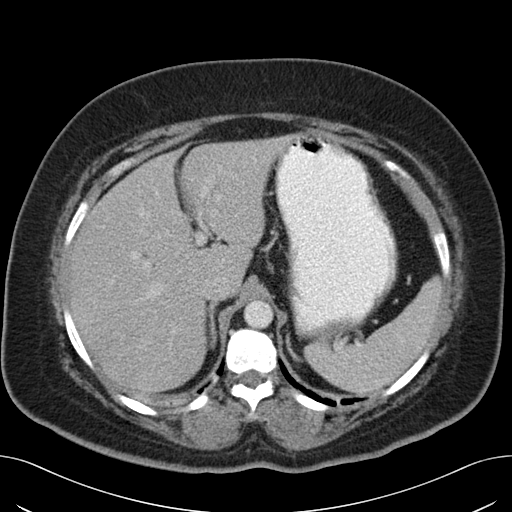
[im 77/92  lung]
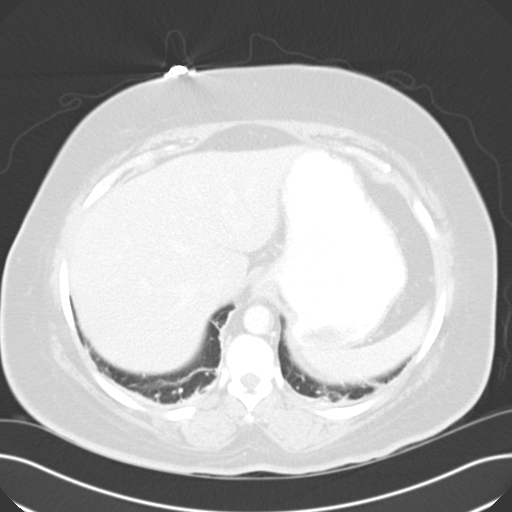
[im 81/92  soft-tissue]
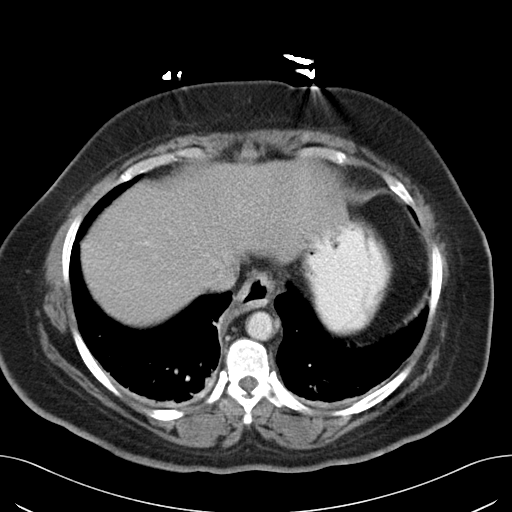
[im 81/92  lung]
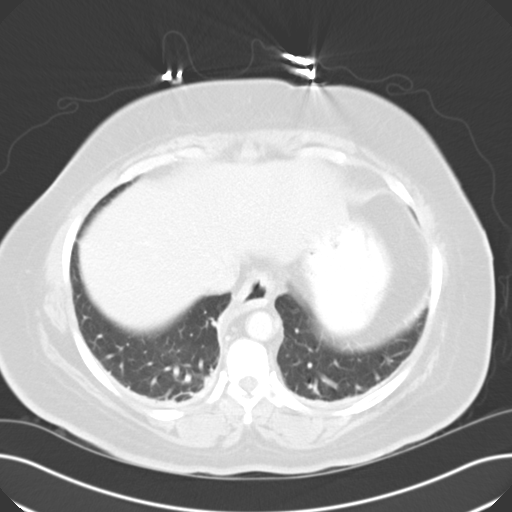
[im 84/92  lung]
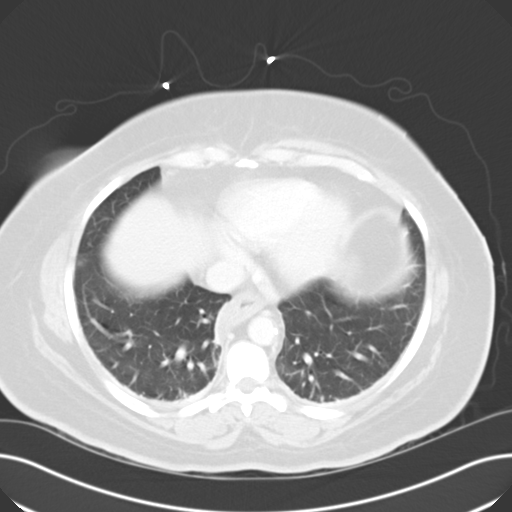
[im 88/92  soft-tissue]
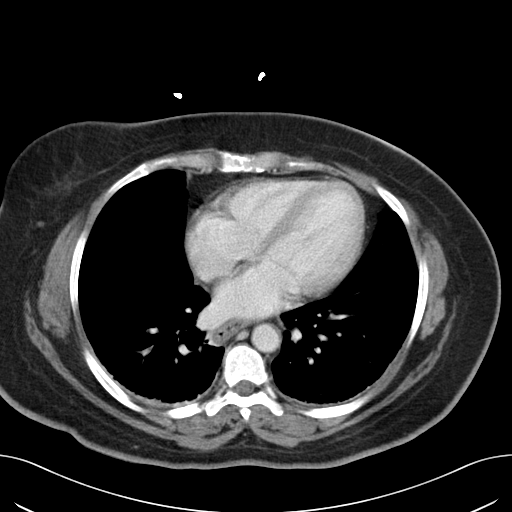
[im 88/92  lung]
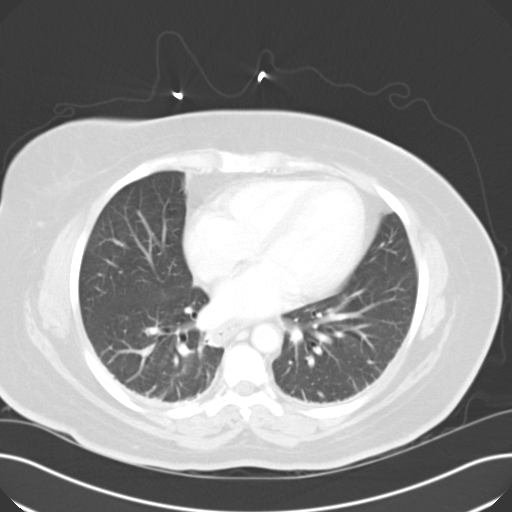

[16 of 32 positions shown; findings below may reference images not displayed]

COMPARISON STUDIES:  Prior CT of abdomen and pelvis of 12/03/2007.

PROCEDURE AND FINDINGS:  IV and oral contrast CT of abdomen and pelvis is
obtained.

The liver and spleen are normal. The pancreas is normal. The adrenals and
kidneys are normal. There is no bowel distention. The appendix is not
identified. No right lower quadrant inflammatory change is noted. The pelvis
is unremarkable. The sigmoid colon is unremarkable. No free air is noted.
The abdominal aorta is atherosclerotic and nonaneurysmal. Basilar
atelectasis is present.
IMPRESSION: Nonspecific exam.

## 2009-06-02 IMAGING — CR DG CHEST 1V PORT
1 series · 1 of 1 positions shown · non-contrast
Comparison: none

REASON FOR EXAM: cp
COMMENTS:

[view not recorded]
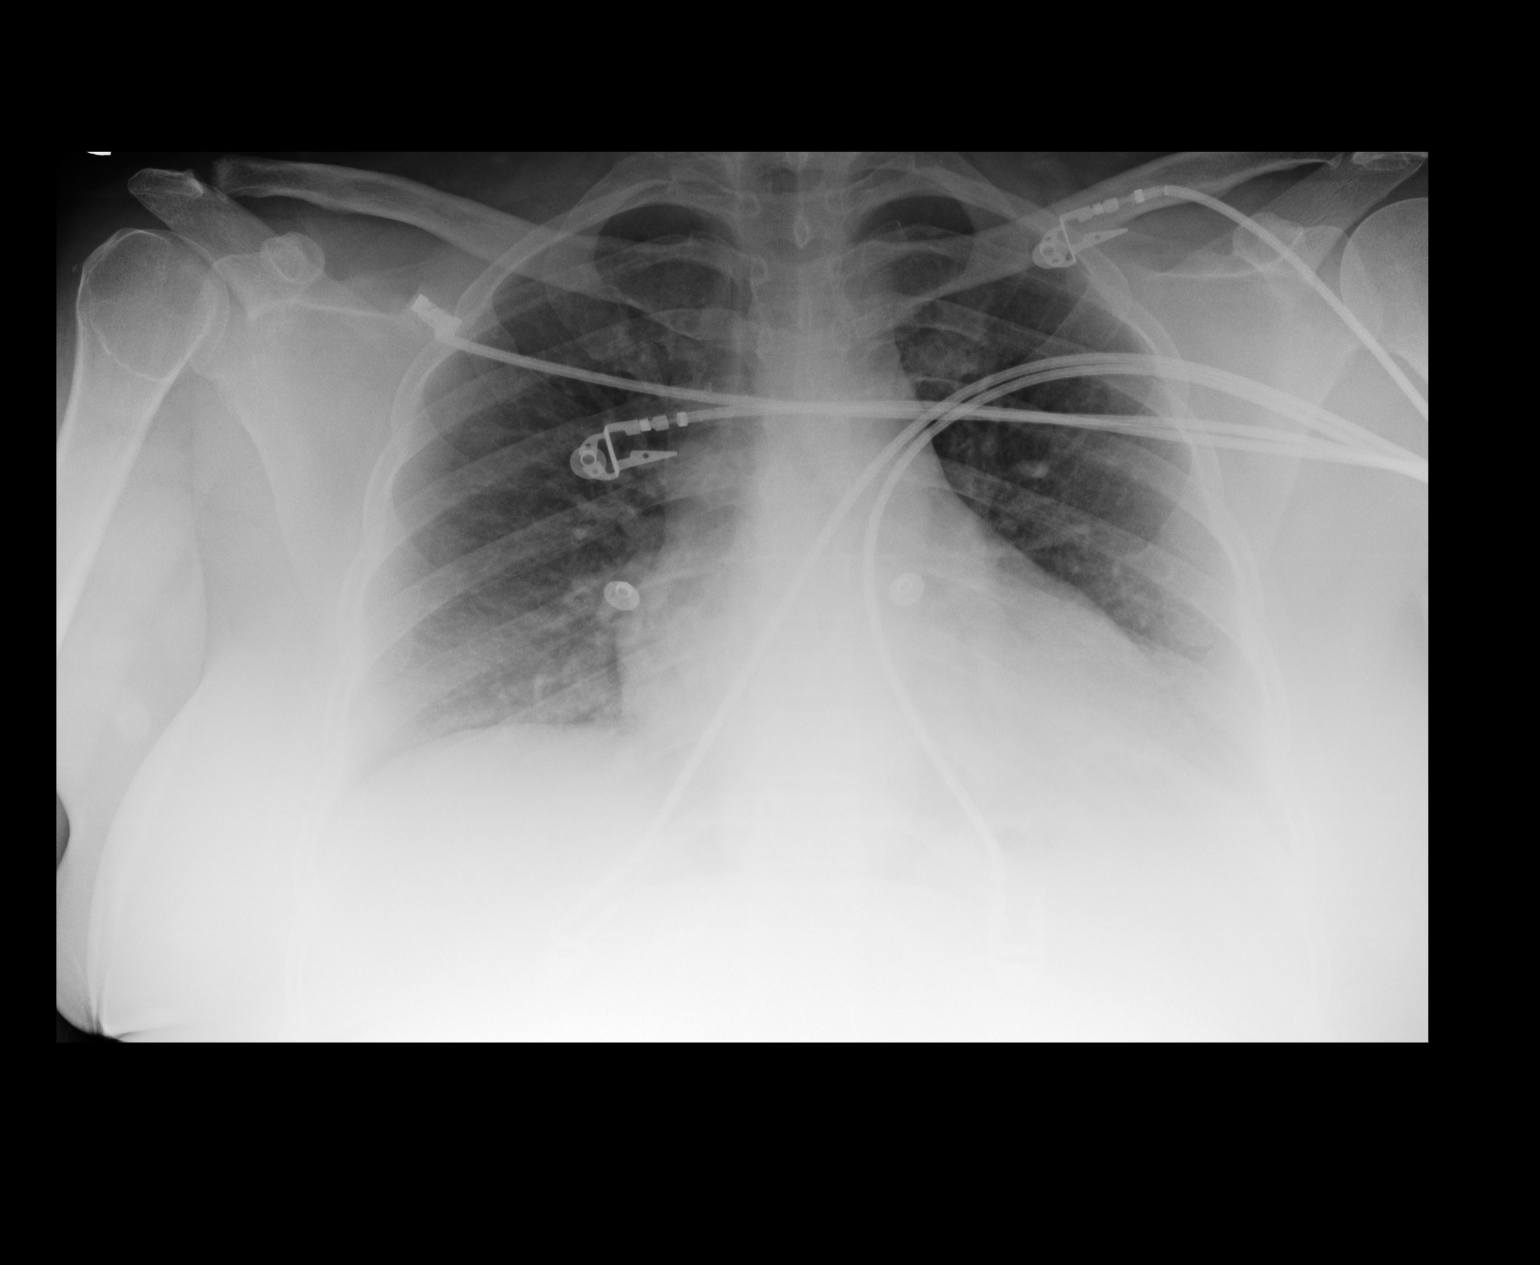

[1 of 1 positions shown; findings below may reference images not displayed]

PROCEDURE:     DXR - DXR PORTABLE CHEST SINGLE VIEW  - January 25, 2009 [DATE]

RESULT:     The lung fields are clear. No pneumonia, pneumothorax or pleural
effusion is seen. The heart appears mildly enlarged but is stable as
compared to the prior exam of 12/03/2007. Monitoring electrodes are present.
IMPRESSION: No acute changes are identified.

## 2009-07-20 ENCOUNTER — Inpatient Hospital Stay: Payer: Self-pay | Admitting: Internal Medicine

## 2009-07-23 ENCOUNTER — Ambulatory Visit: Payer: Self-pay | Admitting: Internal Medicine

## 2009-07-29 ENCOUNTER — Emergency Department: Payer: Self-pay | Admitting: Unknown Physician Specialty

## 2009-07-30 ENCOUNTER — Emergency Department: Payer: Self-pay | Admitting: Internal Medicine

## 2009-08-01 ENCOUNTER — Inpatient Hospital Stay: Payer: Self-pay | Admitting: Internal Medicine

## 2009-08-22 ENCOUNTER — Ambulatory Visit: Payer: Self-pay | Admitting: Internal Medicine

## 2009-08-27 ENCOUNTER — Inpatient Hospital Stay: Payer: Self-pay | Admitting: Internal Medicine

## 2009-09-07 ENCOUNTER — Inpatient Hospital Stay: Payer: Self-pay | Admitting: Internal Medicine

## 2009-09-22 ENCOUNTER — Ambulatory Visit: Payer: Self-pay | Admitting: Internal Medicine

## 2009-09-28 ENCOUNTER — Observation Stay: Payer: Self-pay | Admitting: Internal Medicine

## 2009-10-05 ENCOUNTER — Inpatient Hospital Stay: Payer: Self-pay | Admitting: Internal Medicine

## 2009-10-08 IMAGING — CT CT STONE STUDY
1 of 2 series · 15 of 32 positions shown, 19 images · non-contrast
Comparison: none

REASON FOR EXAM: aabd pain
COMMENTS:

[Series 2: stone · axial · 0.81mm/px · z∈[-1050,-648]mm · 15 of 152 slices shown, 19 images]
[im 12/152  soft-tissue]
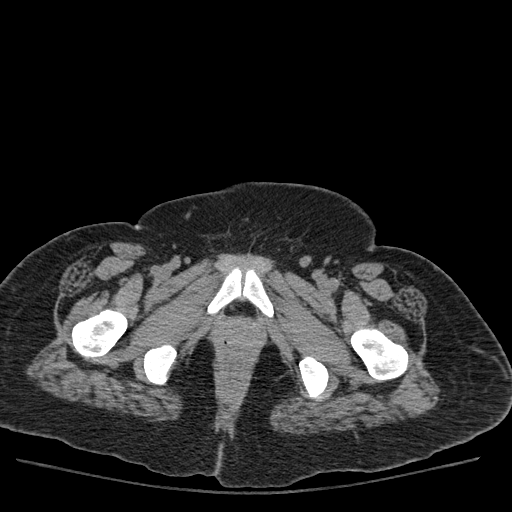
[im 12/152  bone]
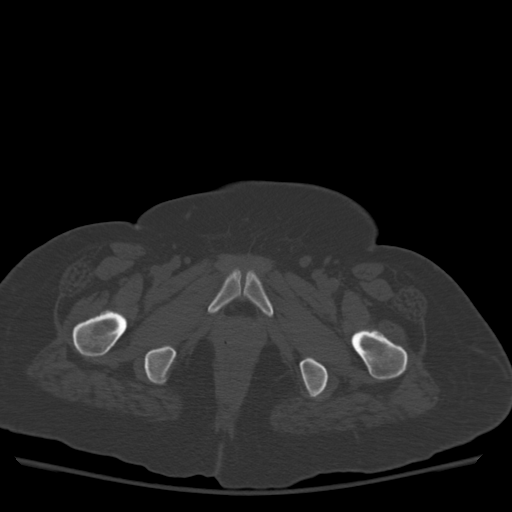
[im 23/152  soft-tissue]
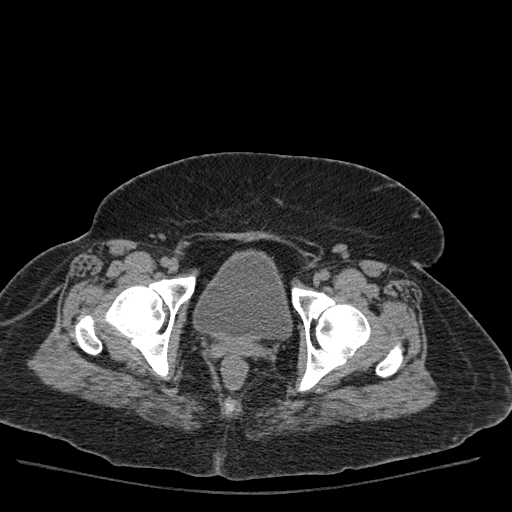
[im 34/152  soft-tissue]
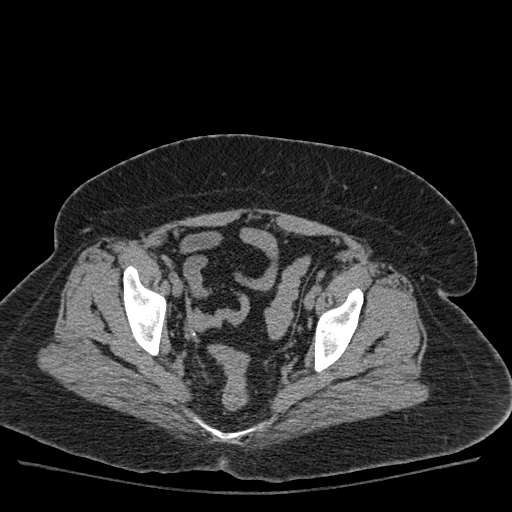
[im 45/152  soft-tissue]
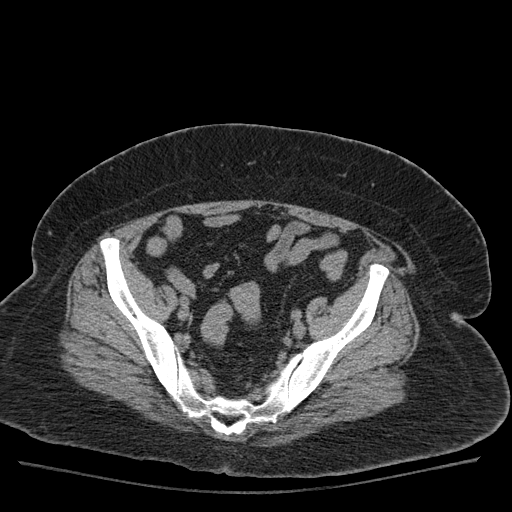
[im 56/152  soft-tissue]
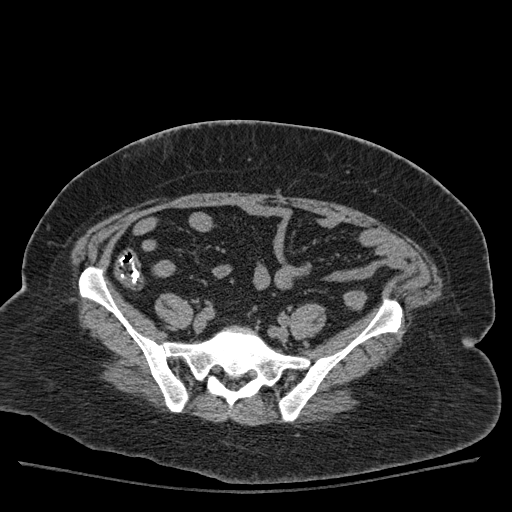
[im 68/152  soft-tissue]
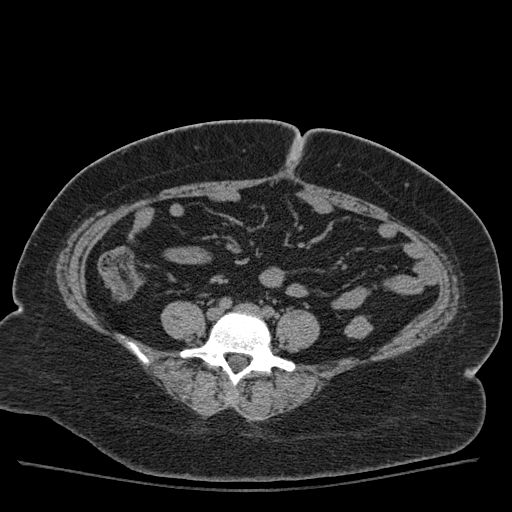
[im 79/152  soft-tissue]
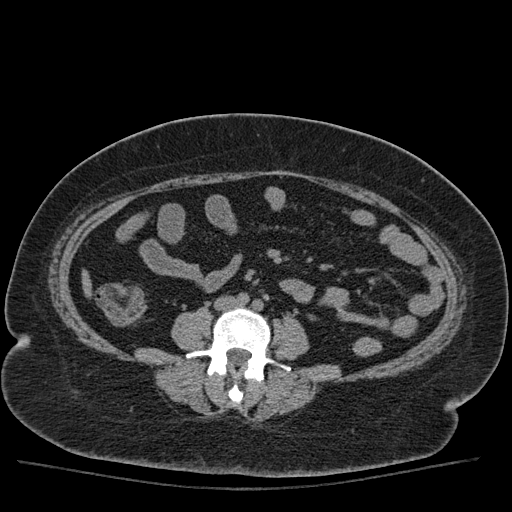
[im 90/152  soft-tissue]
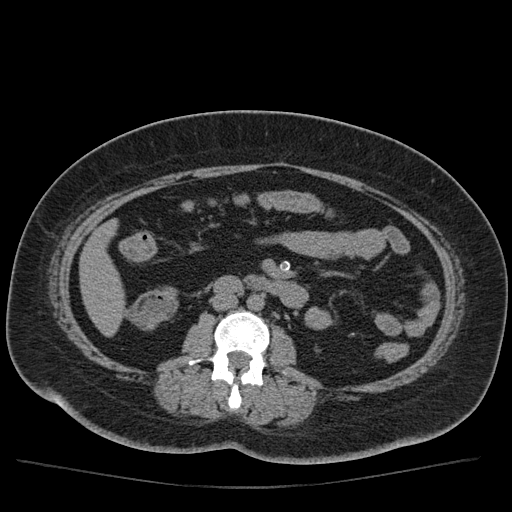
[im 101/152  soft-tissue]
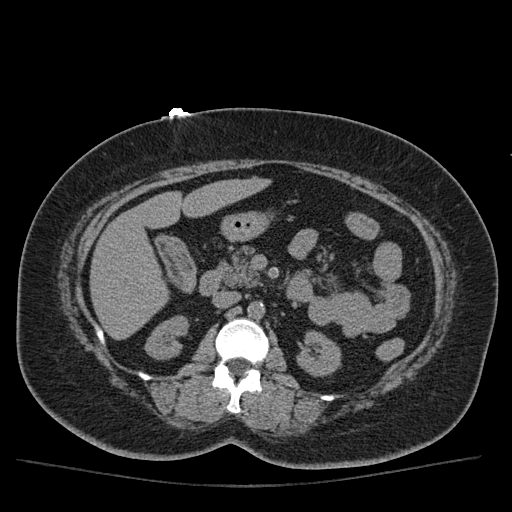
[im 101/152  bone]
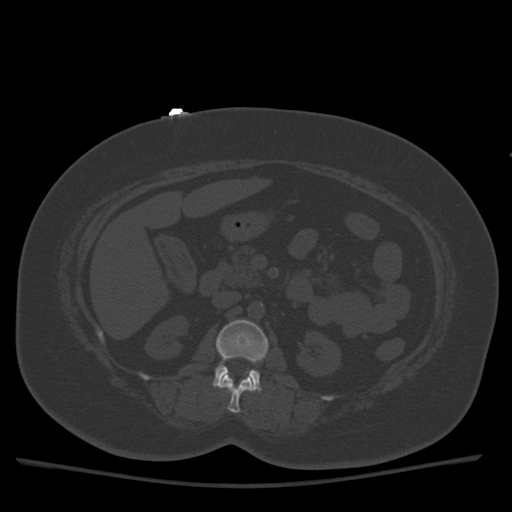
[im 112/152  soft-tissue]
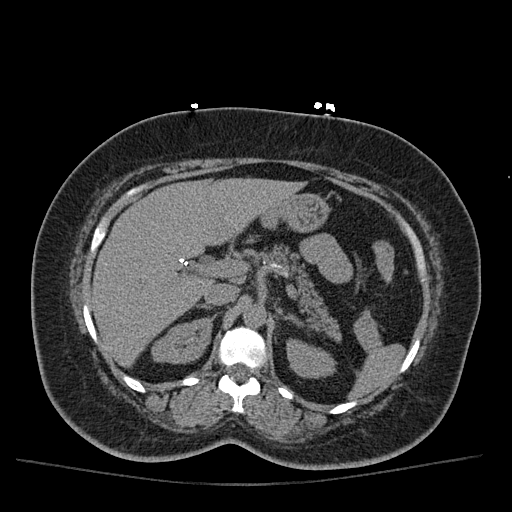
[im 124/152  soft-tissue]
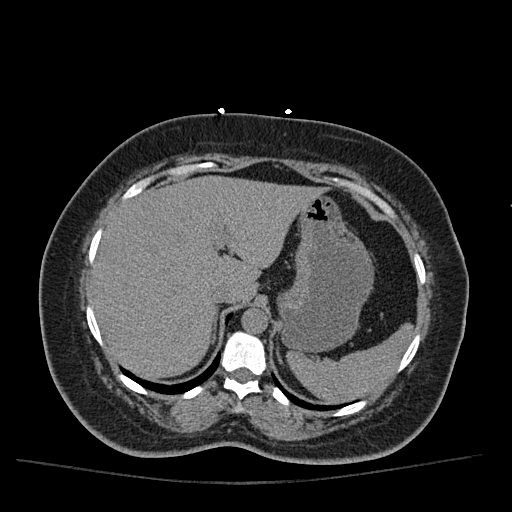
[im 129/152  lung]
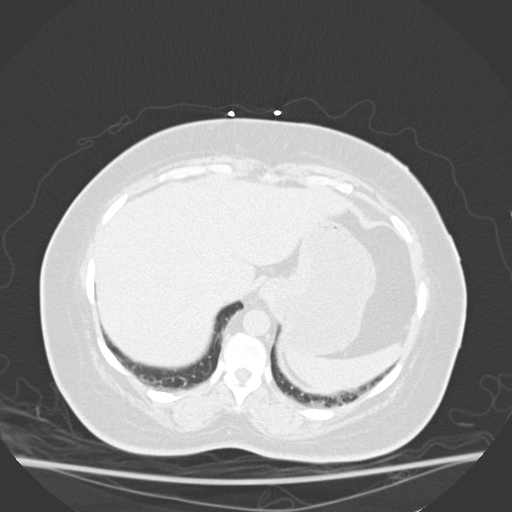
[im 135/152  soft-tissue]
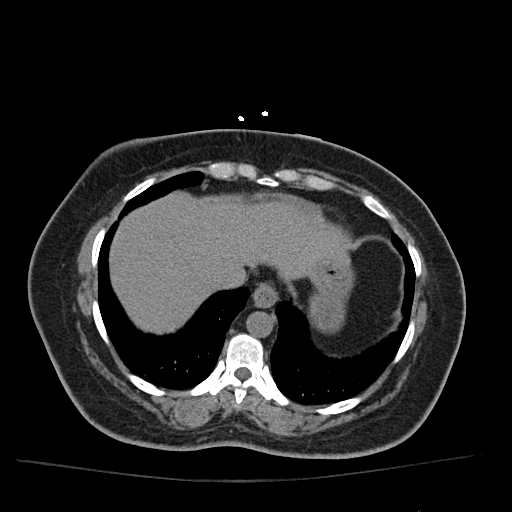
[im 135/152  lung]
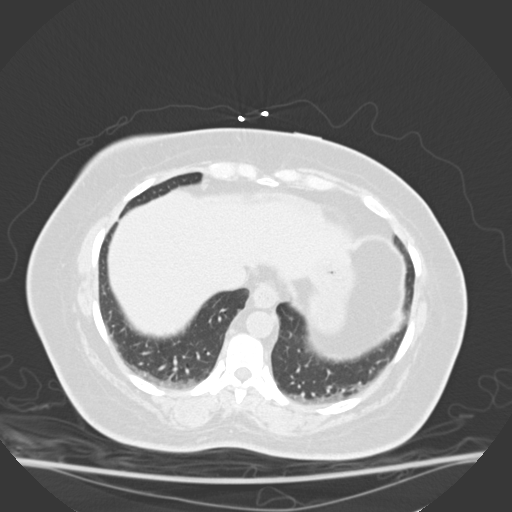
[im 140/152  lung]
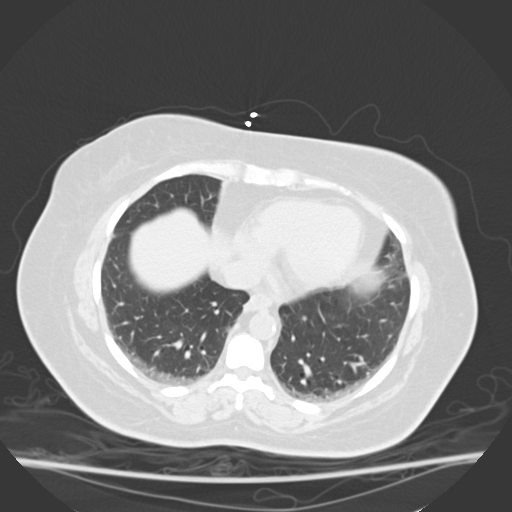
[im 146/152  soft-tissue]
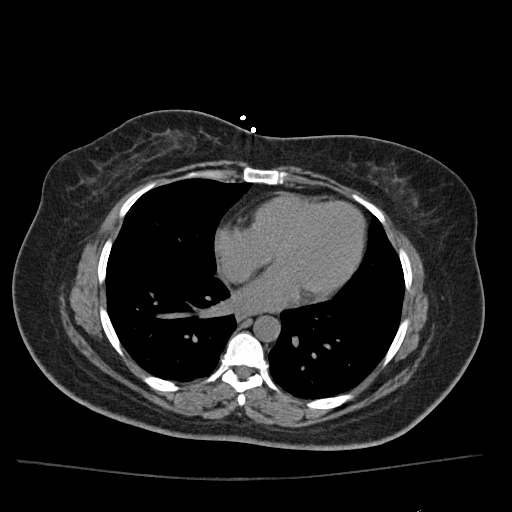
[im 146/152  lung]
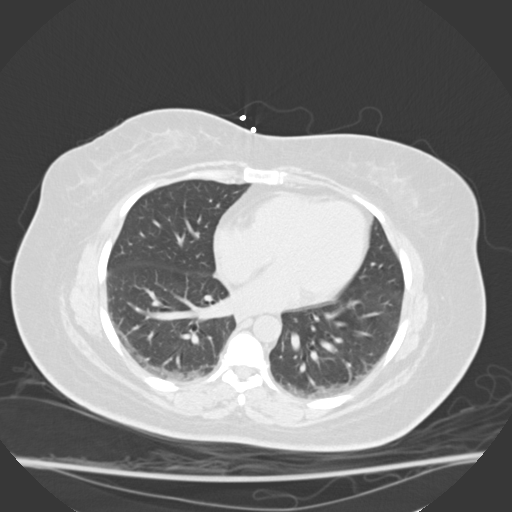

[15 of 32 positions shown; findings below may reference images not displayed]

PROCEDURE:     CT  - CT ABDOMEN /PELVIS WO (STONE)  - June 02, 2009  [DATE]

RESULT:     Axial noncontrast CT scanning was performed through the abdomen
and pelvis at 3 mm intervals and slice thicknesses. Review of 3-dimensional
reconstructed images was performed separately on the [employer]
monitor. Comparison is made to prior studies through the abdomen and pelvis
dated 25 January, 2009 and 03 December, 2007.

The liver exhibits no focal mass nor ductal dilation. The gallbladder is
surgically absent. The spleen, pancreas, partially distended stomach,
adrenal glands, and kidneys are normal in appearance. There is no evidence
of hydronephrosis nor of calcified stones. Along the expected course of the
ureters no abnormalities are seen. The urinary bladder is partially
distended and grossly normal. There are no pelvic masses. The uterus is
surgically absent. There is no evidence of ascites.

The unopacified loops of small and large bowel exhibit no evidence of
obstruction or acute inflammation. The lumbar vertebral bodies are preserved
in height. The lung bases exhibit minimal compressive atelectasis
posteriorly. The caliber of the abdominal aorta is normal. There is no
periaortic nor pericaval lymphadenopathy.
IMPRESSION: 1. The gallbladder is surgically absent. I do not see evidence of
hepatobiliary abnormality.
2. There is no evidence of urinary tract obstruction nor of perinephric
inflammatory change.
3. I do not see acute abnormality of the bowel.
4. There is minimal atelectasis at both lung bases.

## 2009-10-09 ENCOUNTER — Inpatient Hospital Stay: Payer: Self-pay | Admitting: Internal Medicine

## 2009-10-09 IMAGING — CR DG CHEST 2V
1 series · 2 of 2 positions shown · non-contrast
Comparison: none

REASON FOR EXAM: hypotension
COMMENTS:

[Series 1: view not recorded · 0.17mm/px · 2 of 2 slices shown]
[im 1/2]
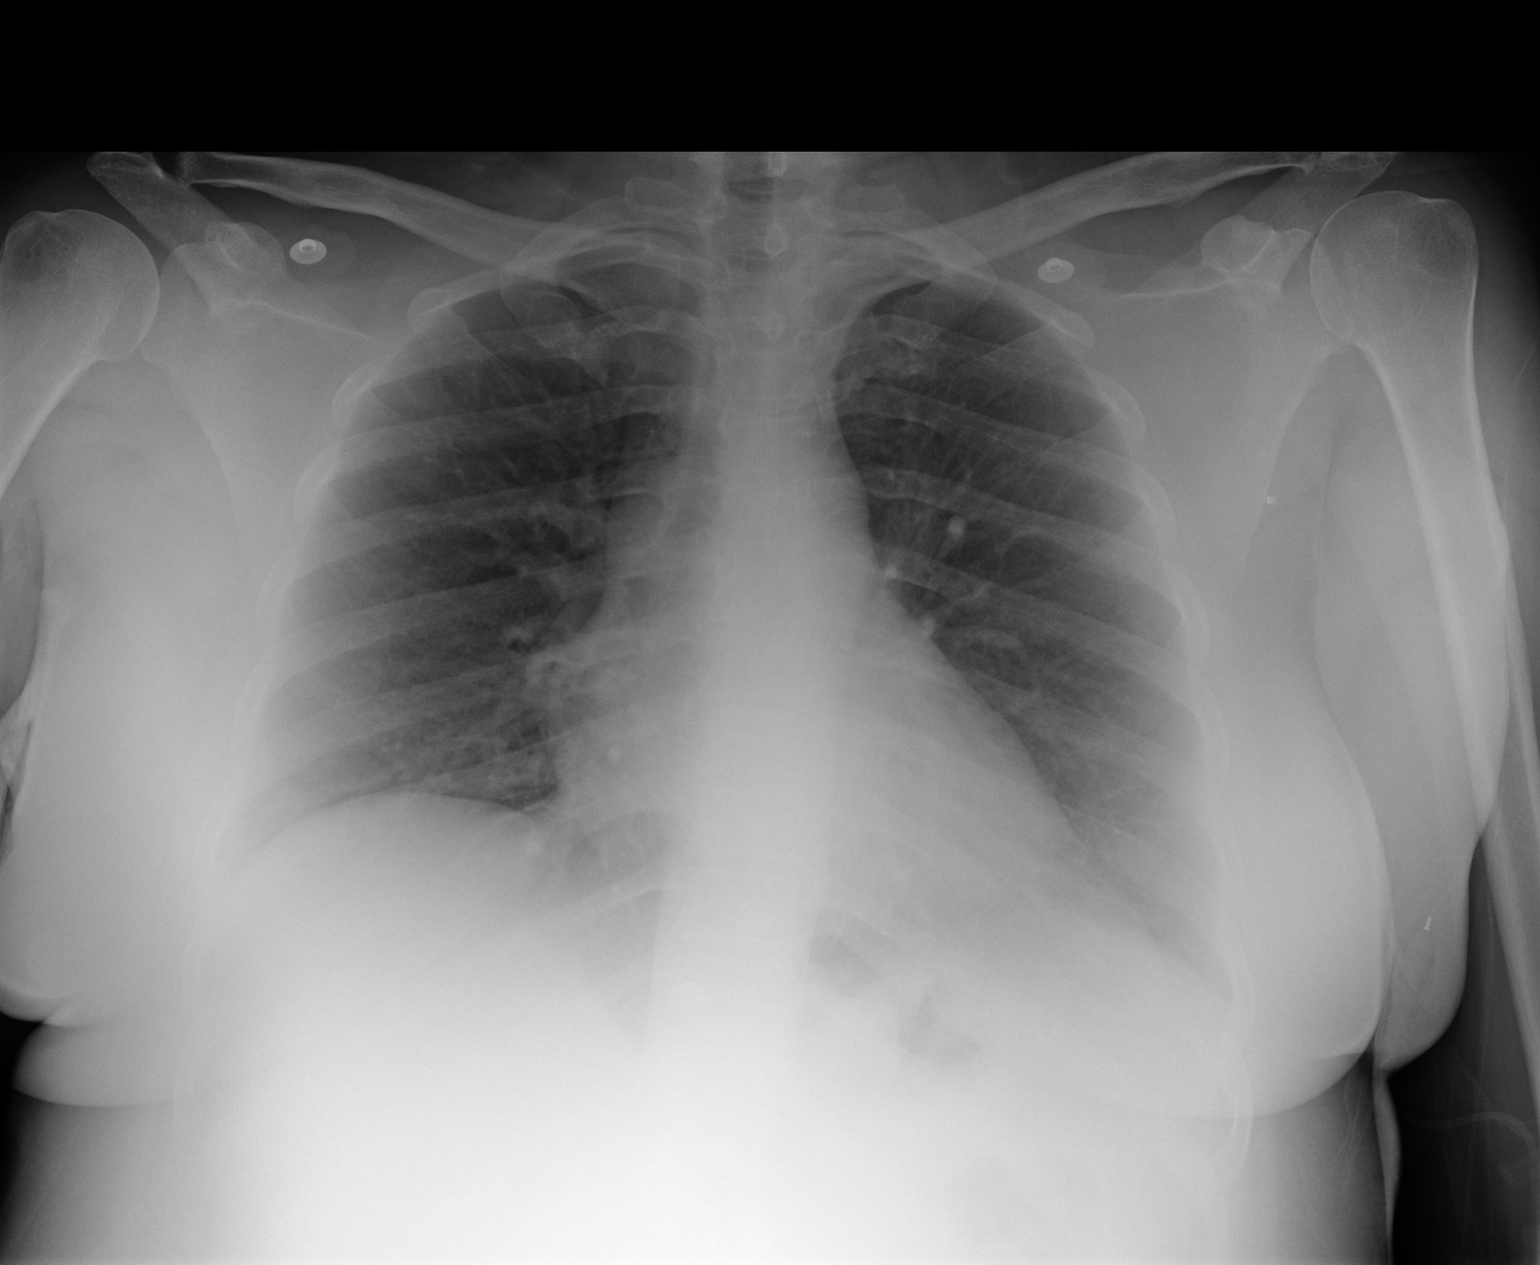
[im 2/2]
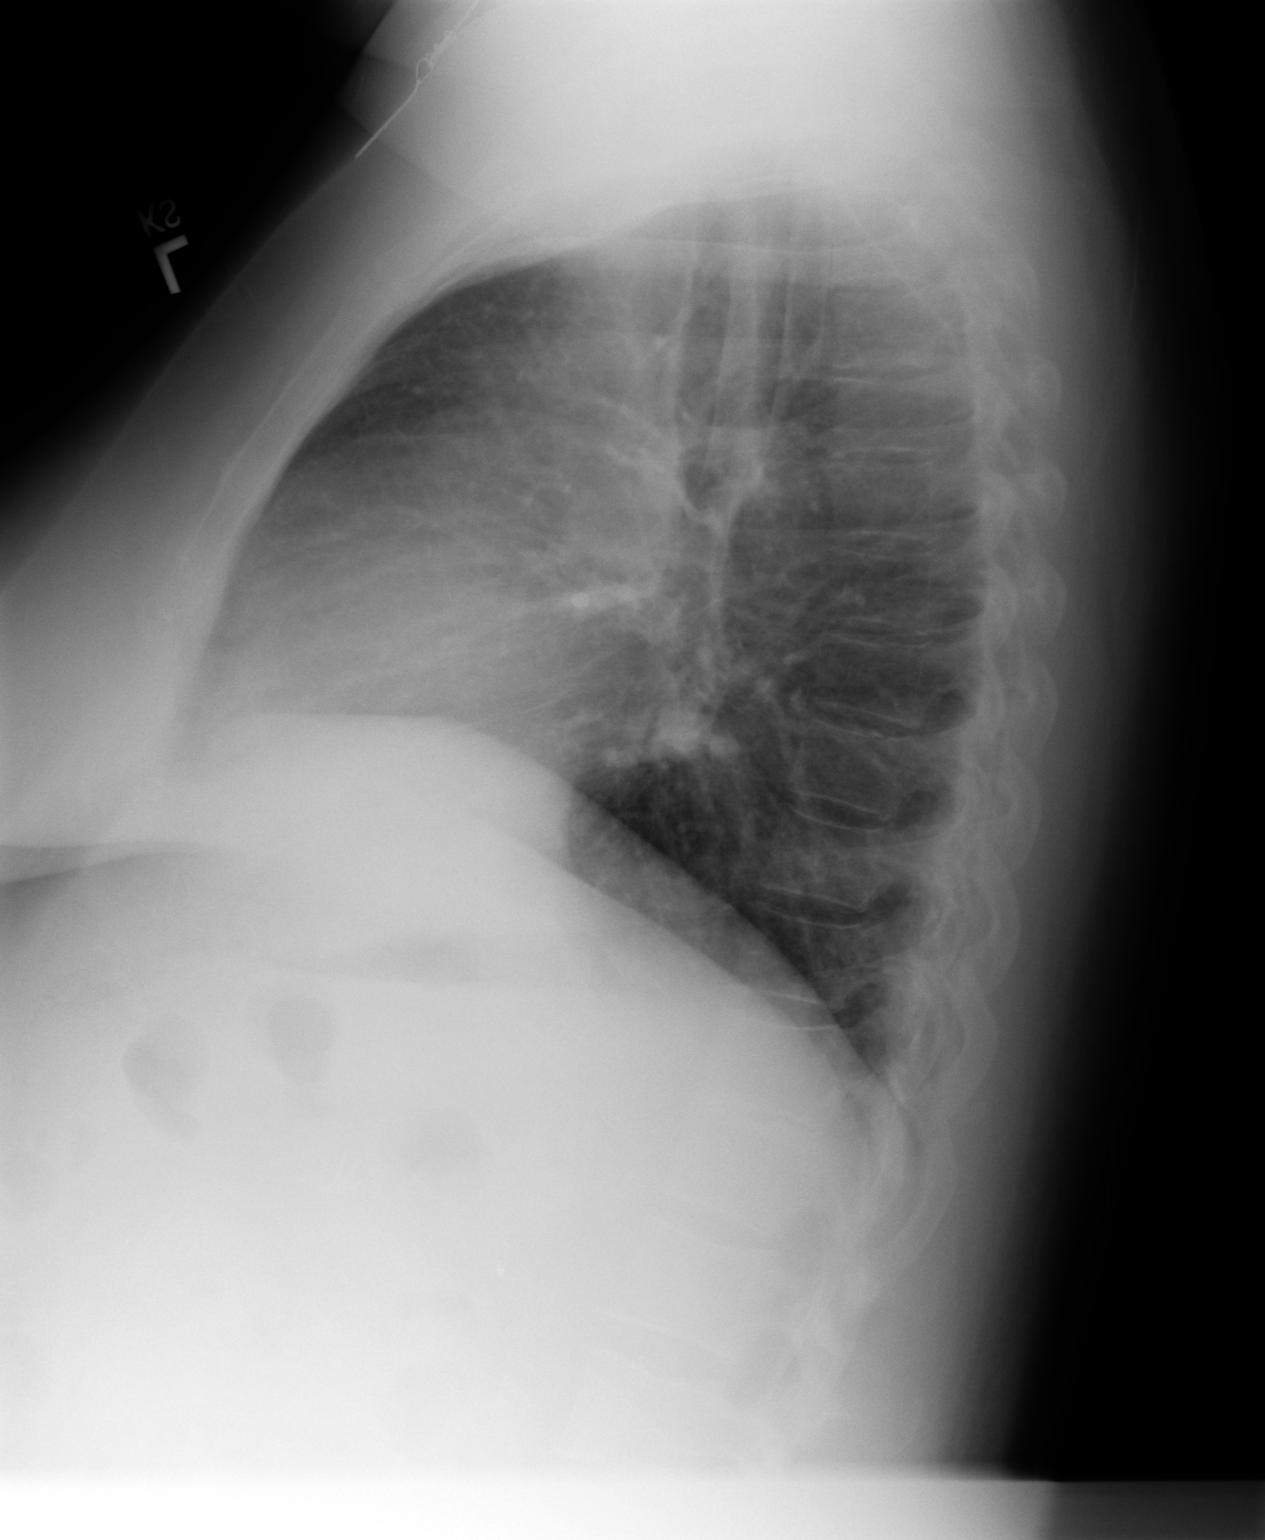

[2 of 2 positions shown; findings below may reference images not displayed]

PROCEDURE:     DXR - DXR CHEST PA (OR AP) AND LATERAL  - June 03, 2009 [DATE]

RESULT:     Comparison is made to study 25 January, 2009.

The lungs are adequately inflated and clear. The cardiac silhouette is
normal in size. The perihilar lung markings are mildly prominent. There is
no pleural effusion.
IMPRESSION: I do not see evidence of acute cardiopulmonary abnormality.

## 2009-10-30 ENCOUNTER — Emergency Department: Payer: Self-pay

## 2009-11-21 ENCOUNTER — Inpatient Hospital Stay: Payer: Self-pay | Admitting: Internal Medicine

## 2009-11-23 IMAGING — CT CT CHEST-ABD-PELV W/ CM
1 of 2 series · 14 of 32 positions shown, 19 images · non-contrast
Comparison: none

REASON FOR EXAM: (1) chest and abd pain, severe, now with intermittant
syncope; (2) chest and abd
COMMENTS:

[Series 6: soft tissue · axial · 0.84mm/px · z∈[+303,+828]mm · 14 of 195 slices shown, 19 images]
[im 10/195  soft-tissue]
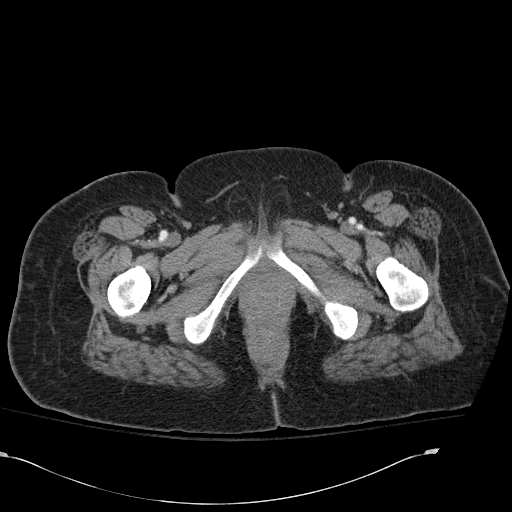
[im 10/195  bone]
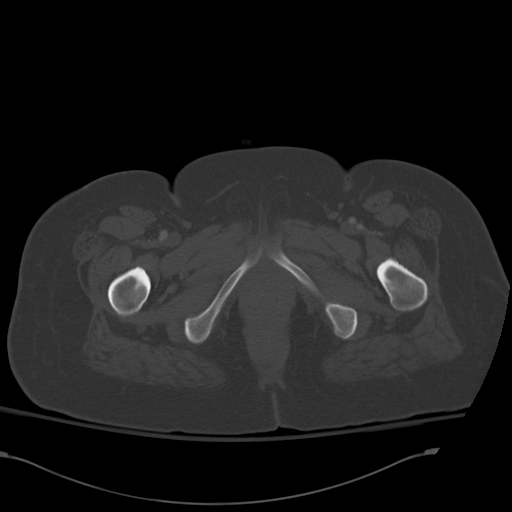
[im 28/195  soft-tissue]
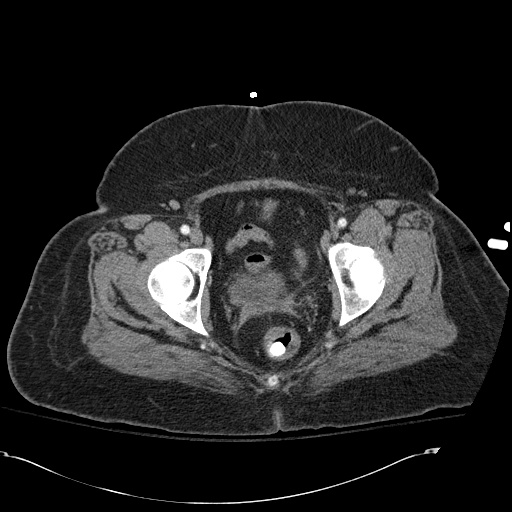
[im 37/195  soft-tissue]
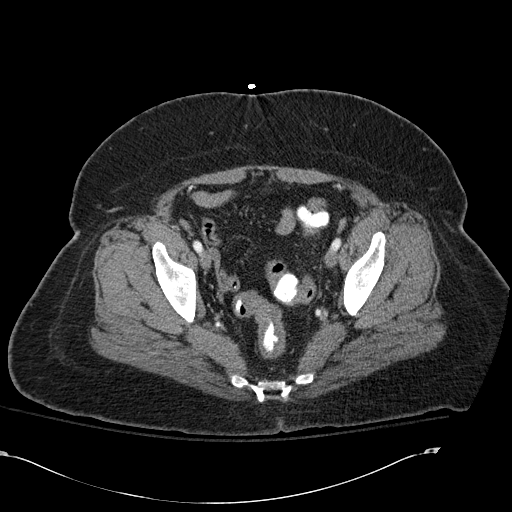
[im 56/195  soft-tissue]
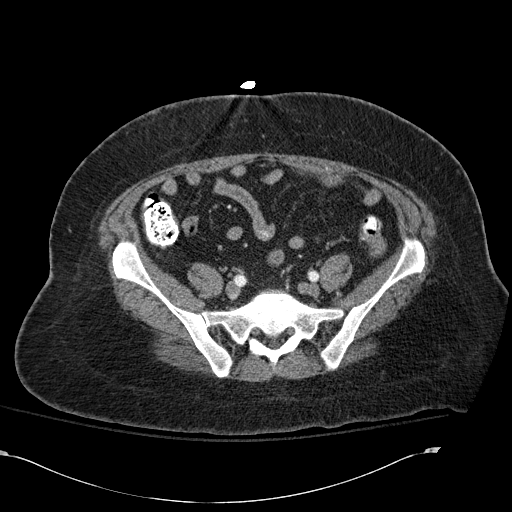
[im 65/195  soft-tissue]
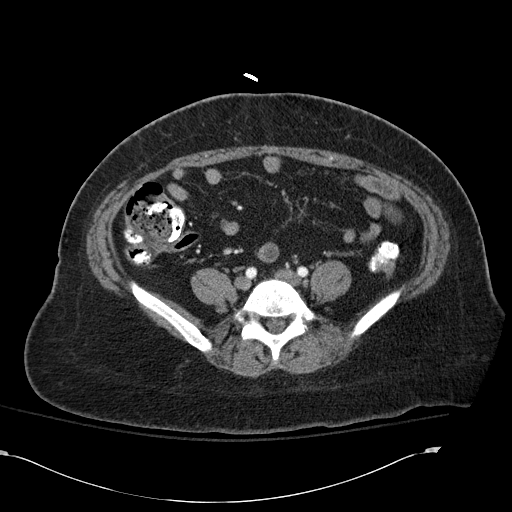
[im 84/195  soft-tissue]
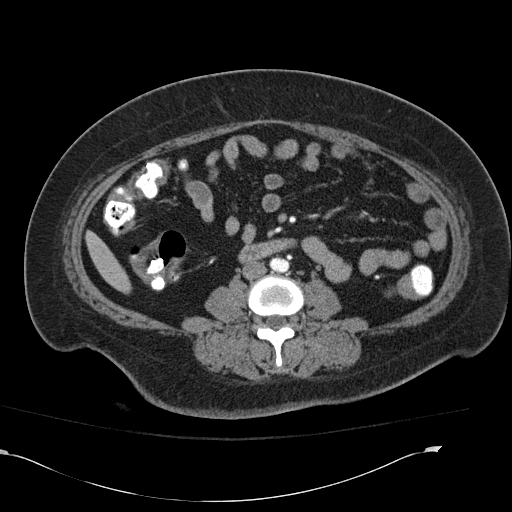
[im 102/195  soft-tissue]
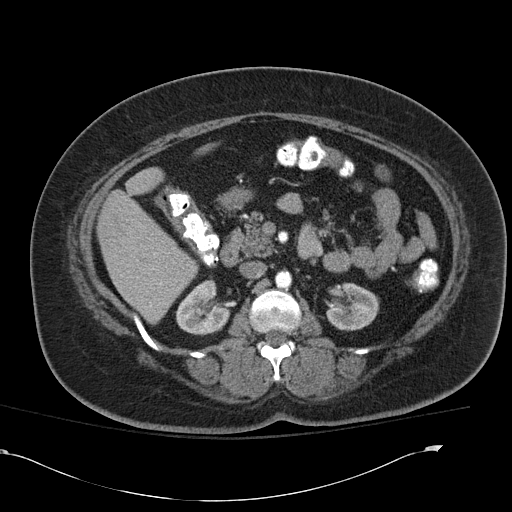
[im 111/195  soft-tissue]
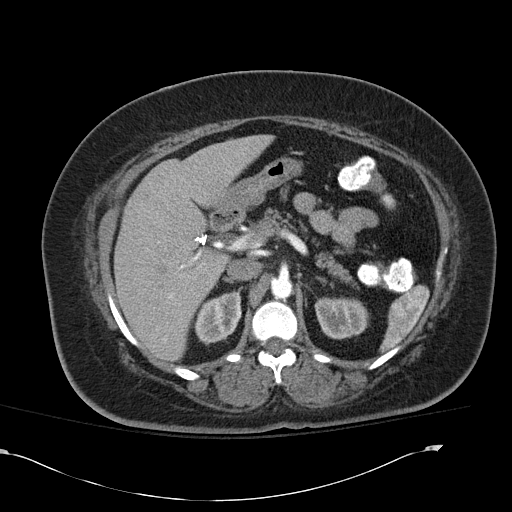
[im 130/195  soft-tissue]
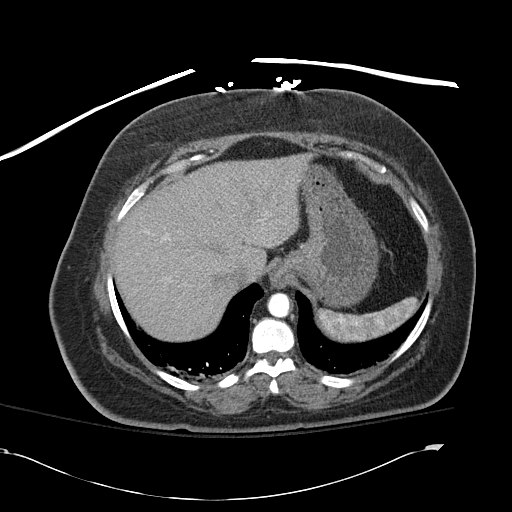
[im 130/195  bone]
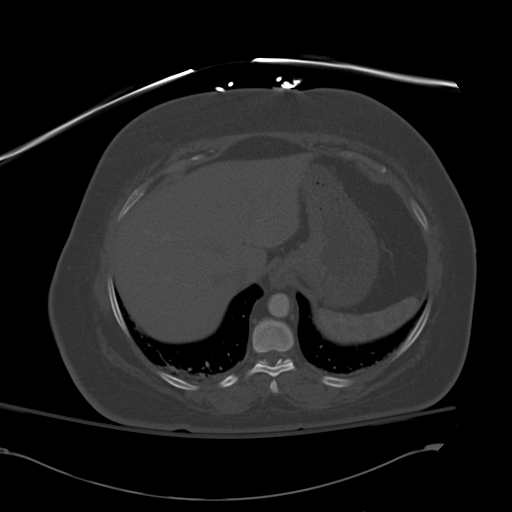
[im 139/195  soft-tissue]
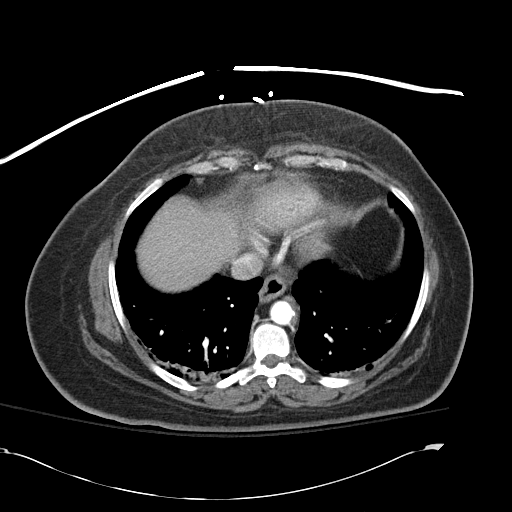
[im 158/195  soft-tissue]
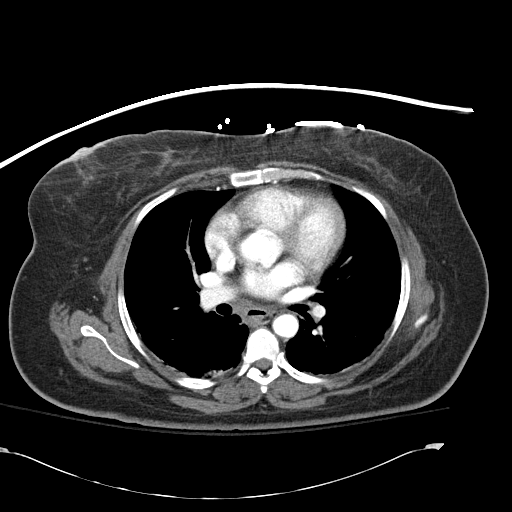
[im 158/195  lung]
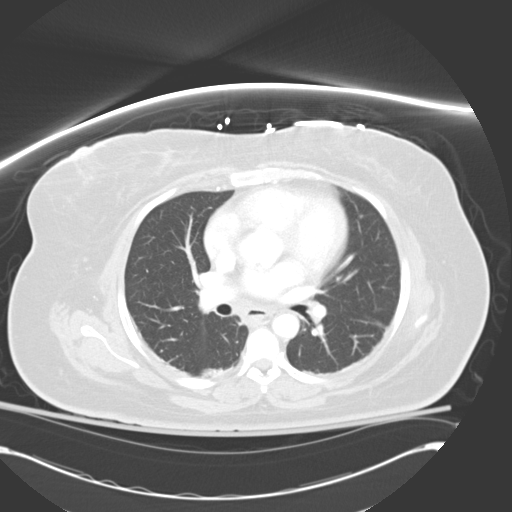
[im 167/195  soft-tissue]
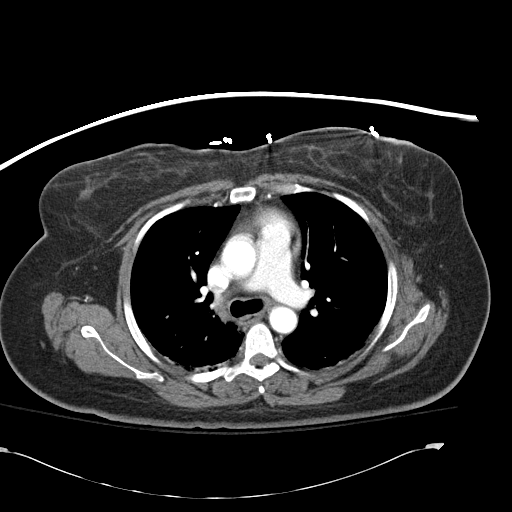
[im 167/195  lung]
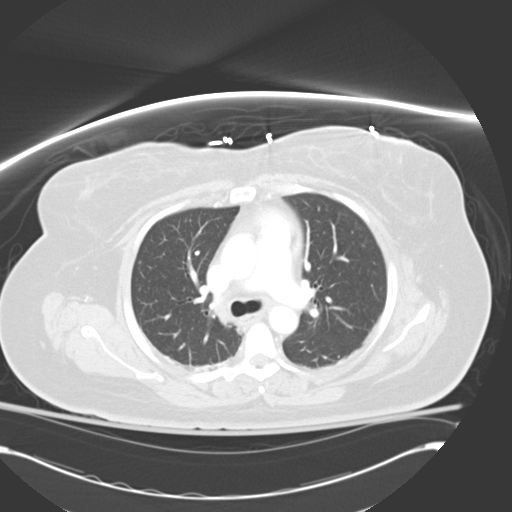
[im 176/195  lung]
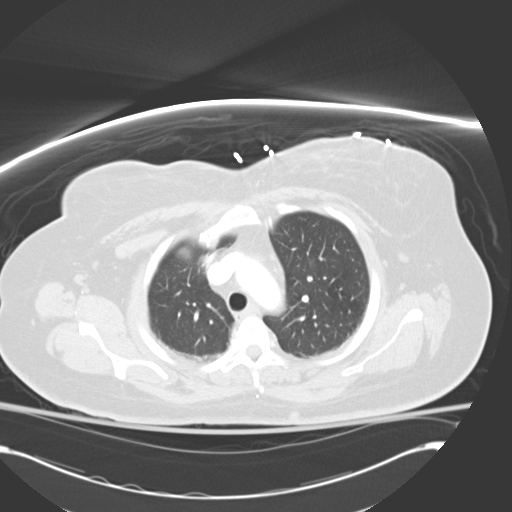
[im 185/195  soft-tissue]
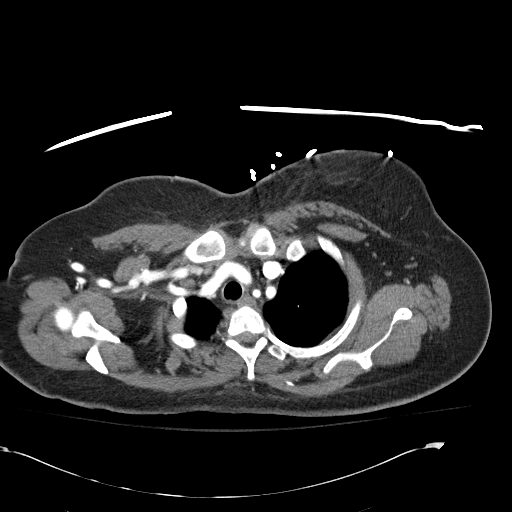
[im 185/195  lung]
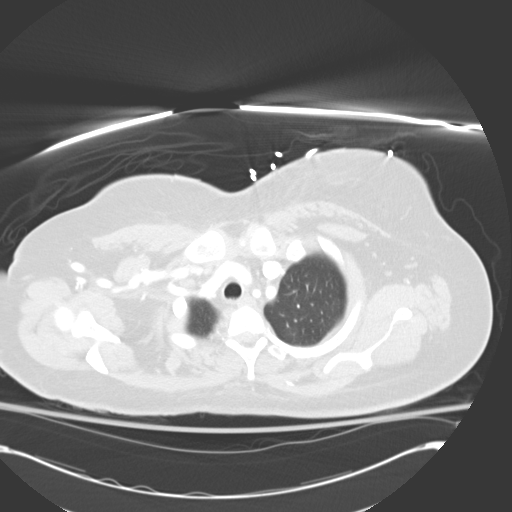

[14 of 32 positions shown; findings below may reference images not displayed]

PROCEDURE:     CT  - CT CHEST ABDOMEN AND PELVIS W  - July 18, 2009  [DATE]

RESULT:     Emergent CT of the chest is performed utilizing 85 mL of
Qsovue-VT2 iodinated intravenous contrast. Imaging was continued through the
abdomen and pelvis. Images are reconstructed at 3 mm slice thickness and
compared to noncontrast images of 06/02/2009 and 2 contrast enhanced images
of 01/25/2009, both of which were through the abdomen and pelvis.

The aorta is normal in caliber and demonstrates no evidence of dissection.
There is motion artifact at the root of the aorta. The lungs demonstrate
dependent atelectasis bilaterally but are otherwise unremarkable. There is
no mediastinal or hilar mass or adenopathy. No thyroid or axillary mass is
seen. The heart is borderline enlarged.

Images of the abdomen and pelvis demonstrate cholecystectomy changes are
present. The kidneys demonstrate no obstruction. The liver, spleen, pancreas
and adrenal glands appear unremarkable. The kidneys appear to be somewhat
small bilaterally. Followup creatinine after contrast is recommended. There
is minimal opacification of the small bowel. There is no abnormal bowel
distention area there is no acute inflammation. The urinary bladder is
unremarkable. It appears the patient has undergone hysterectomy. There is
also reported appendectomy. There is no definite bowel wall thickening or
abnormal bowel distention. The bony structures appear unremarkable.
IMPRESSION: Postsurgical changes with dependent atelectasis. The
kidneys are somewhat small for the patient's age. Correlate with history of
renal disease an with followup creatinines specially after contrast

## 2009-11-23 IMAGING — CR DG CHEST 1V PORT
1 series · 1 of 1 positions shown · non-contrast
Comparison: none

REASON FOR EXAM: Chest Pain
COMMENTS:

PROCEDURE:     DXR - DXR PORTABLE CHEST SINGLE VIEW  - July 18, 2009  [DATE]
RESULT:     Comparison is made study 03 June, 2009.
The lungs are adequately inflated. The heart is normal in size. The
pulmonary vascularity is not engorged. I see no pleural effusion.

[view not recorded]
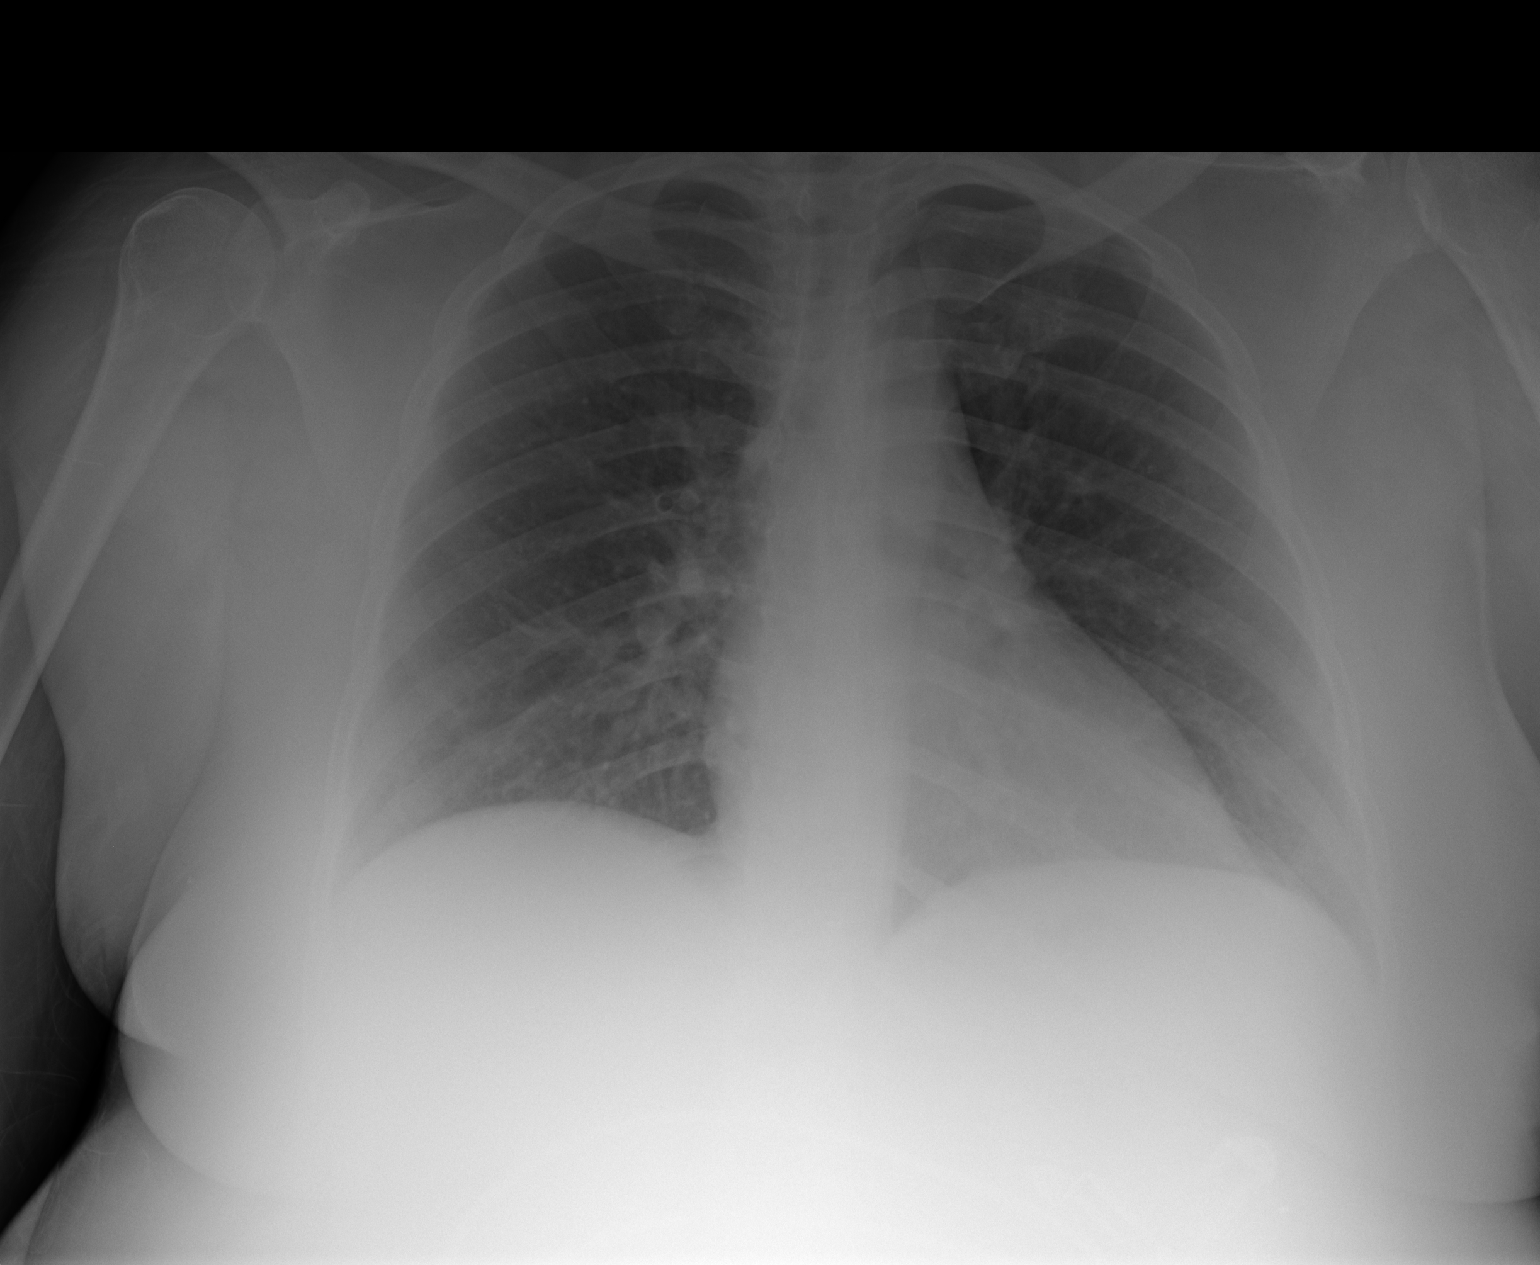

[1 of 1 positions shown; findings below may reference images not displayed]

IMPRESSION: I do not see evidence of acute cardiopulmonary abnormality.
A followup PA and lateral chest x-ray would be of value if the patient's
symptoms persist and remain unexplained.

## 2009-12-01 IMAGING — US ABDOMEN ULTRASOUND
1 series · 17 of 25 positions shown · non-contrast
Comparison: none

REASON FOR EXAM: RUQ abd pain
COMMENTS:

[Series 1: abdomen ultrasound · 17 of 84 slices shown]
[im 1/84]
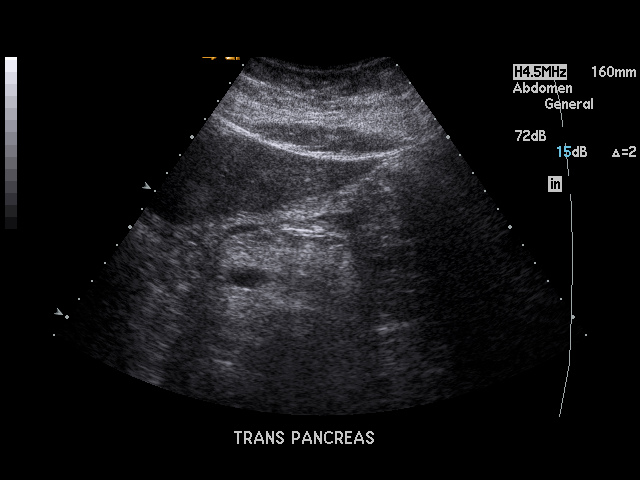
[im 7/84]
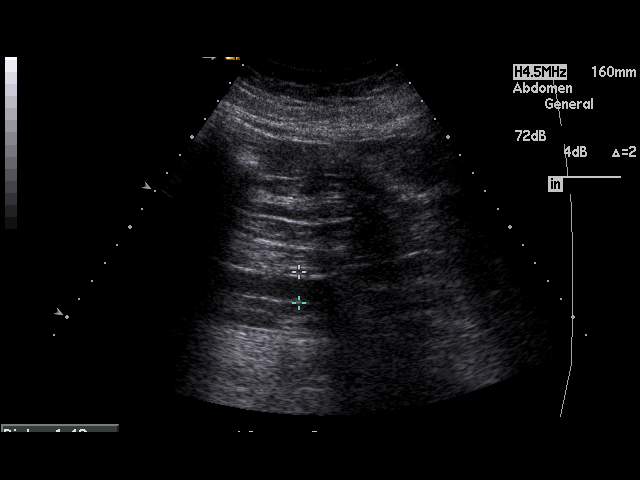
[im 11/84]
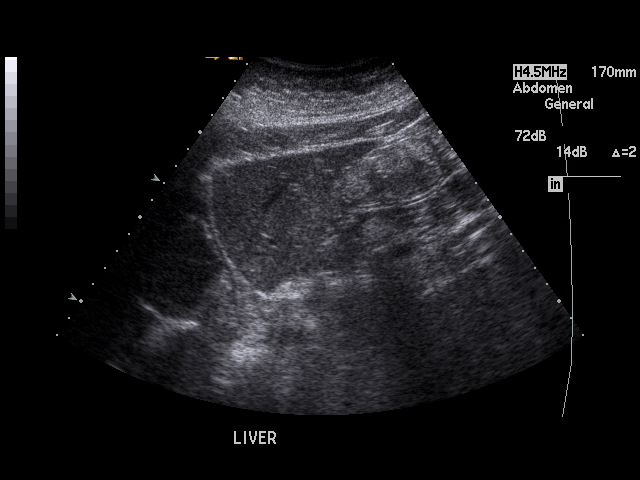
[im 18/84]
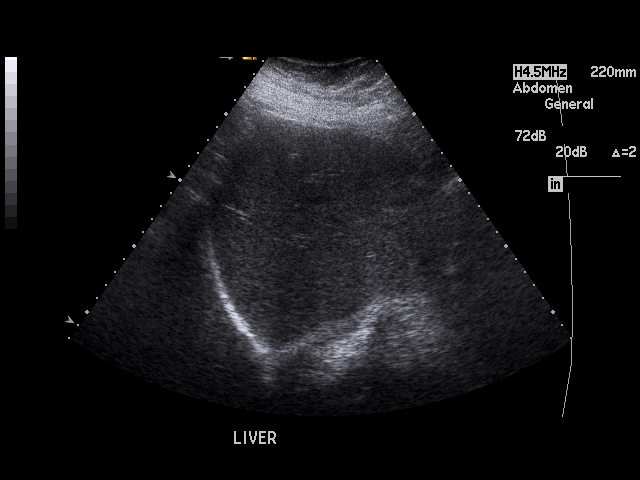
[im 21/84]
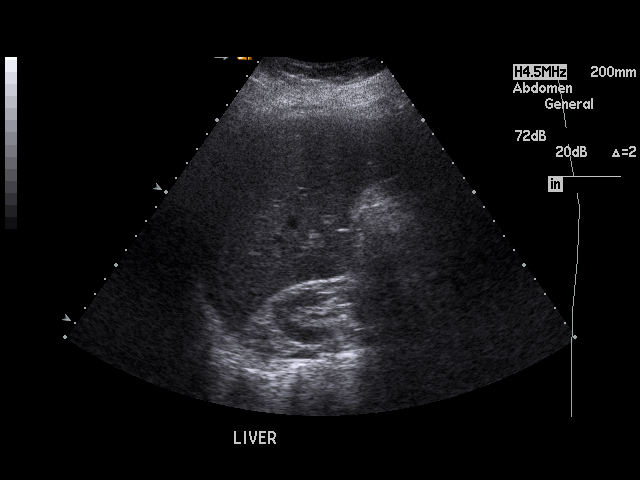
[im 28/84]
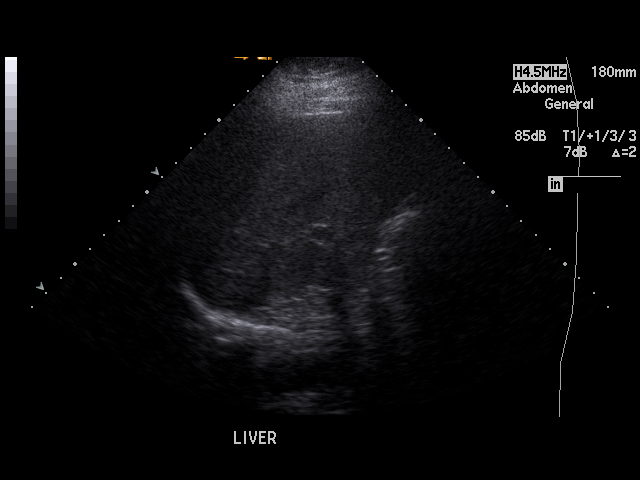
[im 32/84]
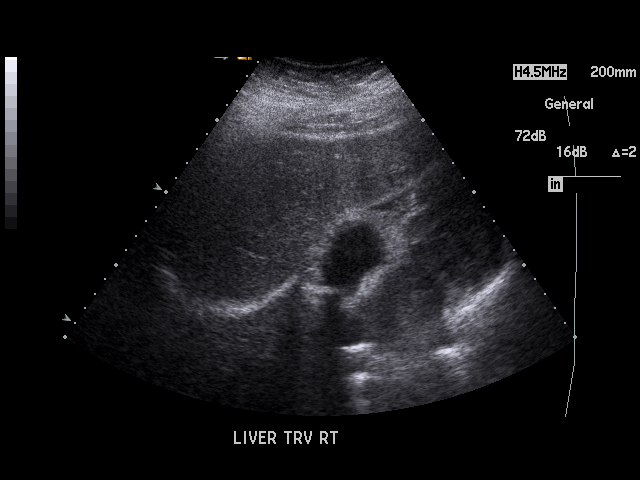
[im 39/84]
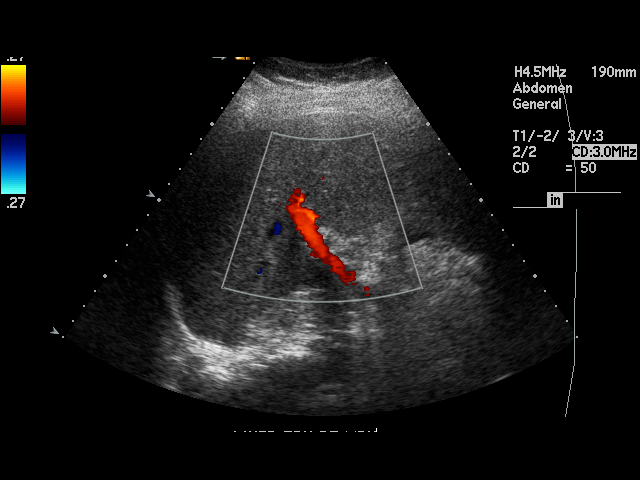
[im 42/84]
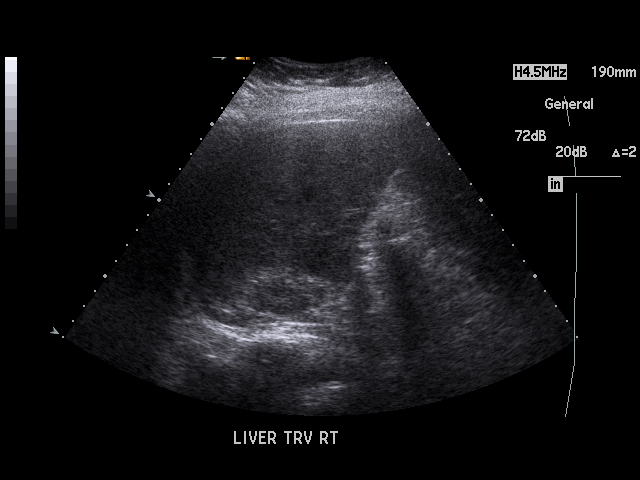
[im 45/84]
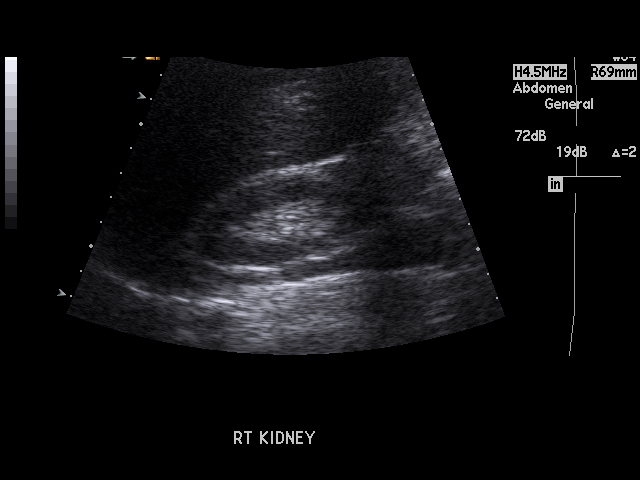
[im 52/84]
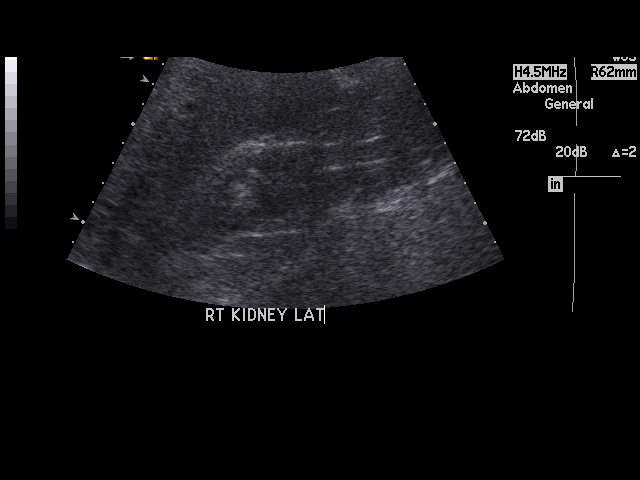
[im 56/84]
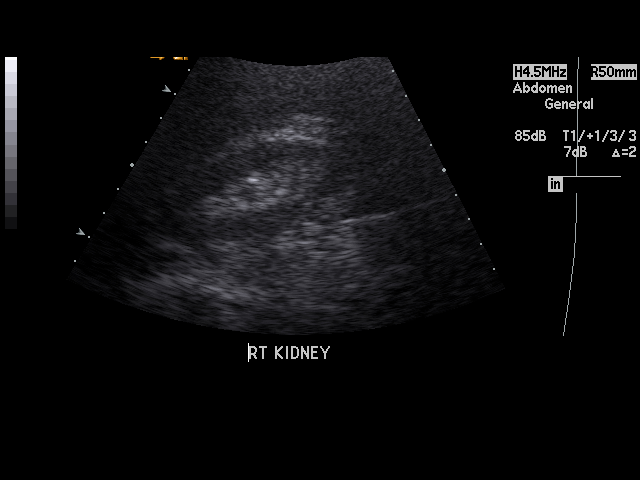
[im 63/84]
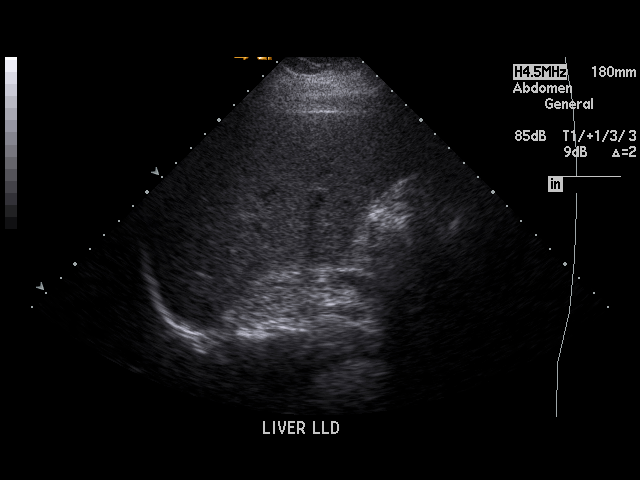
[im 66/84]
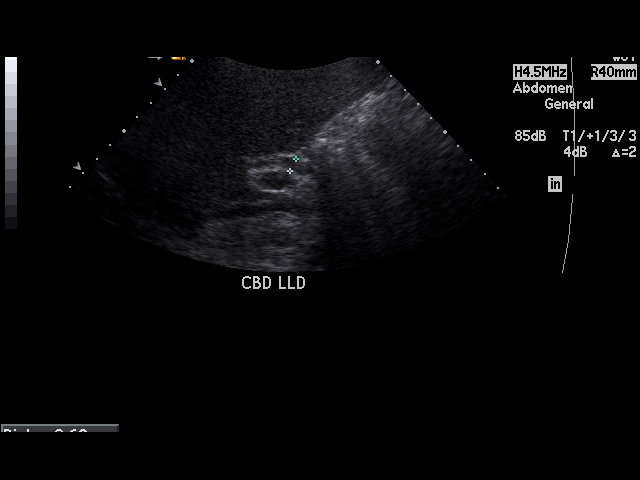
[im 73/84]
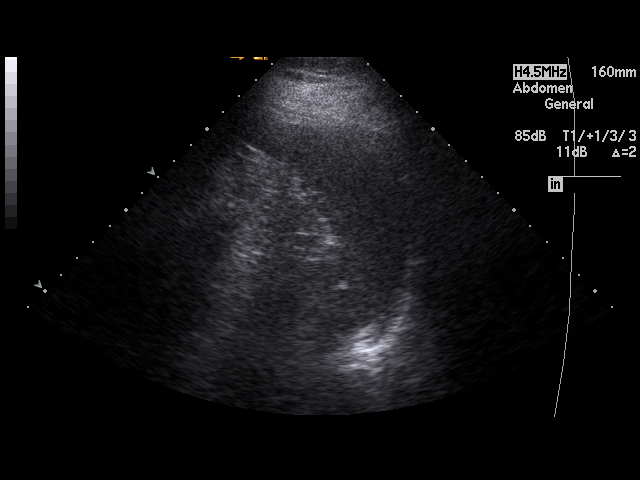
[im 77/84]
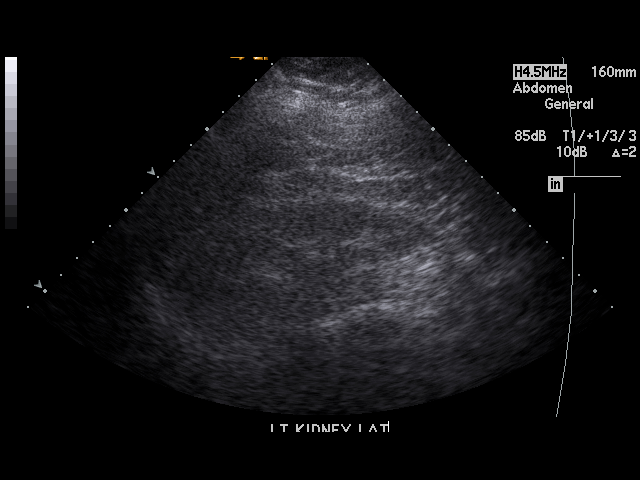
[im 84/84]
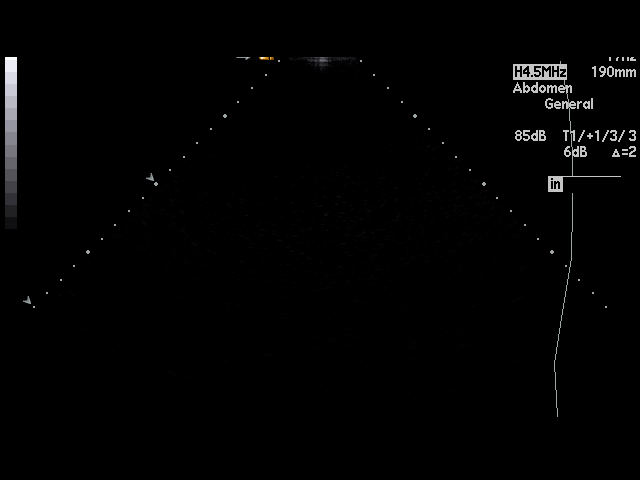

[17 of 25 positions shown; findings below may reference images not displayed]

PROCEDURE:     US  - US ABDOMEN GENERAL SURVEY  - July 26, 2009  [DATE]

RESULT:     The liver and spleen are normal in appearance. The pancreatic
tail is obscured by bowel gas. The visualized portion of the pancreas is
normal in appearance. The abdominal aorta and inferior vena cava show no
significant abnormalities. The gallbladder is not seen compatible with prior
cholecystectomy. The common bile duct measures 6.9 mm in diameter which is
within normal limits for the post cholecystectomy patient. The right kidney
measures 7.85 cm in length and the left kidney measures 8.52 cm in length.
No renal mass lesions or hydronephrosis is seen. No ascites is observed.
IMPRESSION: 1. No acute changes are identified.
2. The patient is status post cholecystectomy.
3. The kidneys are somewhat small bilaterally.

## 2009-12-04 ENCOUNTER — Ambulatory Visit: Payer: Self-pay | Admitting: Otolaryngology

## 2009-12-04 IMAGING — CR DG CHEST 1V PORT
1 series · 1 of 1 positions shown · non-contrast
Comparison: none

REASON FOR EXAM: abd pain
COMMENTS:

[view not recorded]
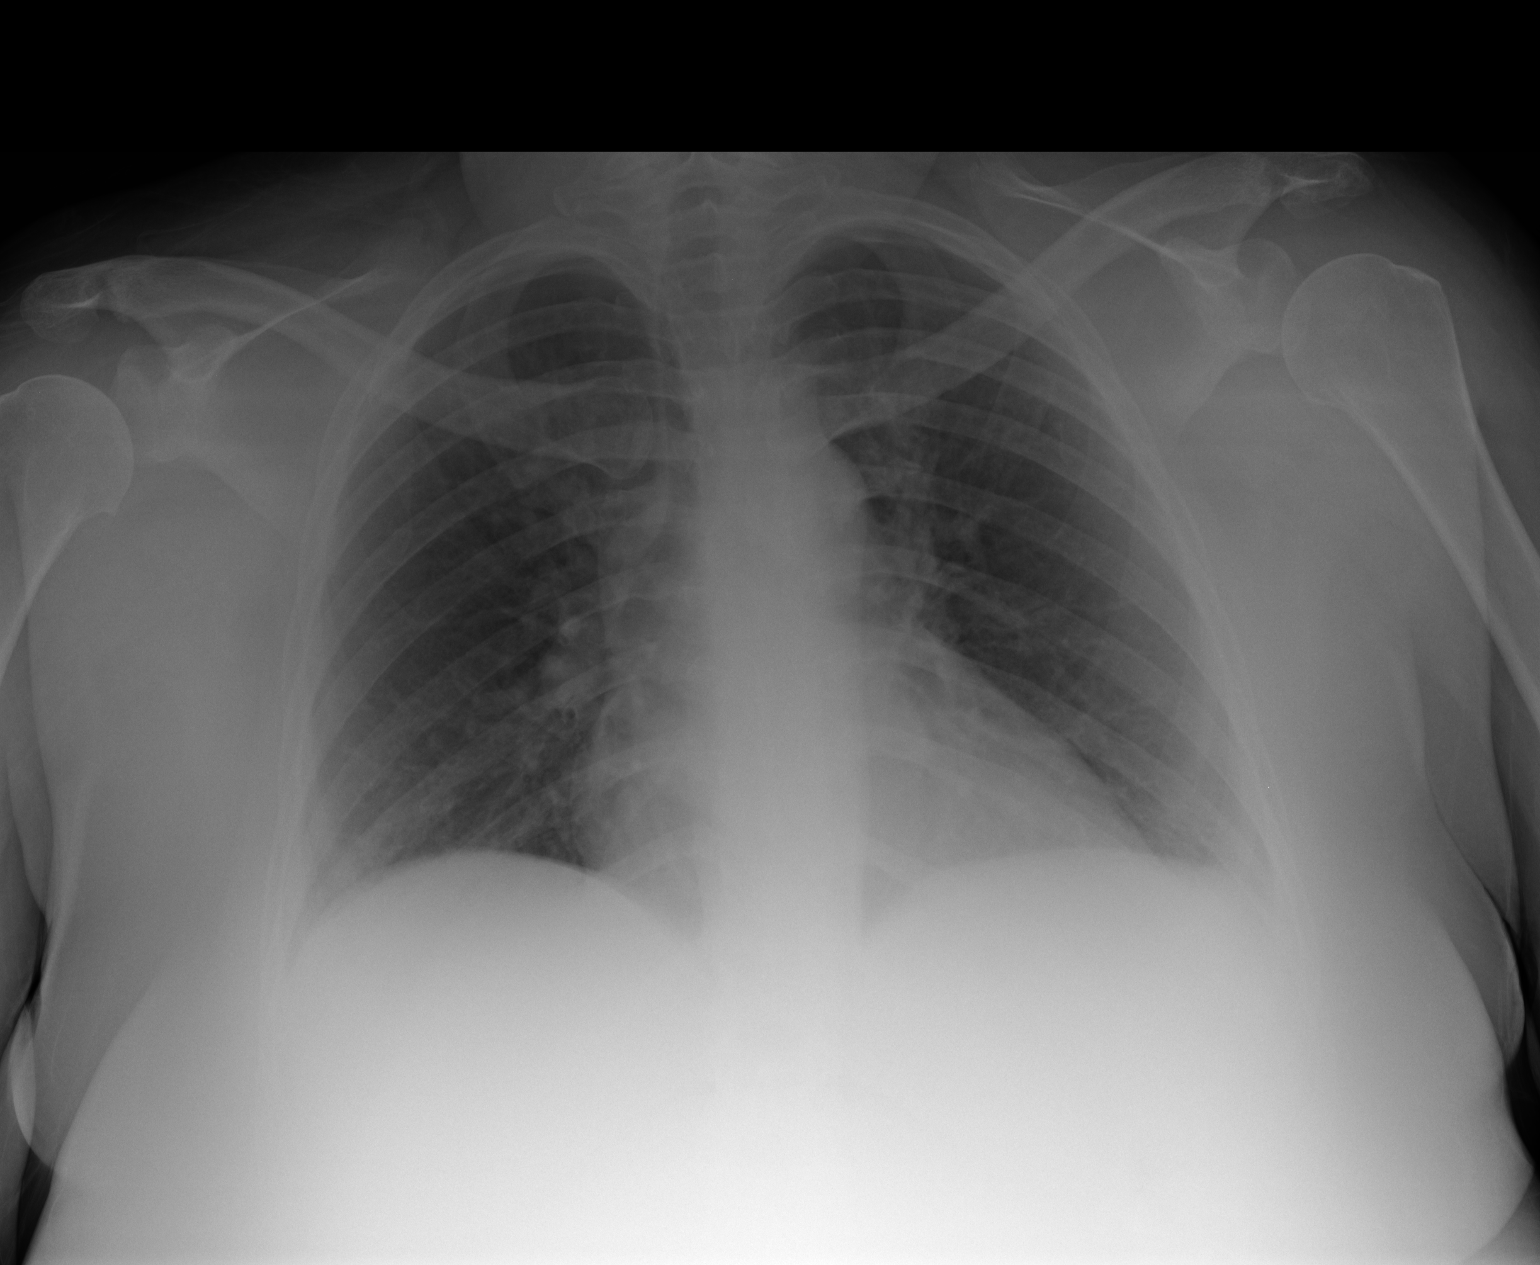

[1 of 1 positions shown; findings below may reference images not displayed]

PROCEDURE:     DXR - DXR PORTABLE CHEST SINGLE VIEW  - July 29, 2009 [DATE]

RESULT:     Comparison is made to the exam of 07/18/2009.

The lungs are clear. The heart and pulmonary vessels are normal. The bony
and mediastinal structures are unremarkable. There is no effusion. There is
no pneumothorax or evidence of congestive failure.
IMPRESSION: No acute cardiopulmonary disease. Stable appearance.

## 2009-12-07 IMAGING — CR DG CHEST 1V PORT
1 series · 1 of 1 positions shown · non-contrast
Comparison: none

REASON FOR EXAM: cp
COMMENTS:

[view not recorded]
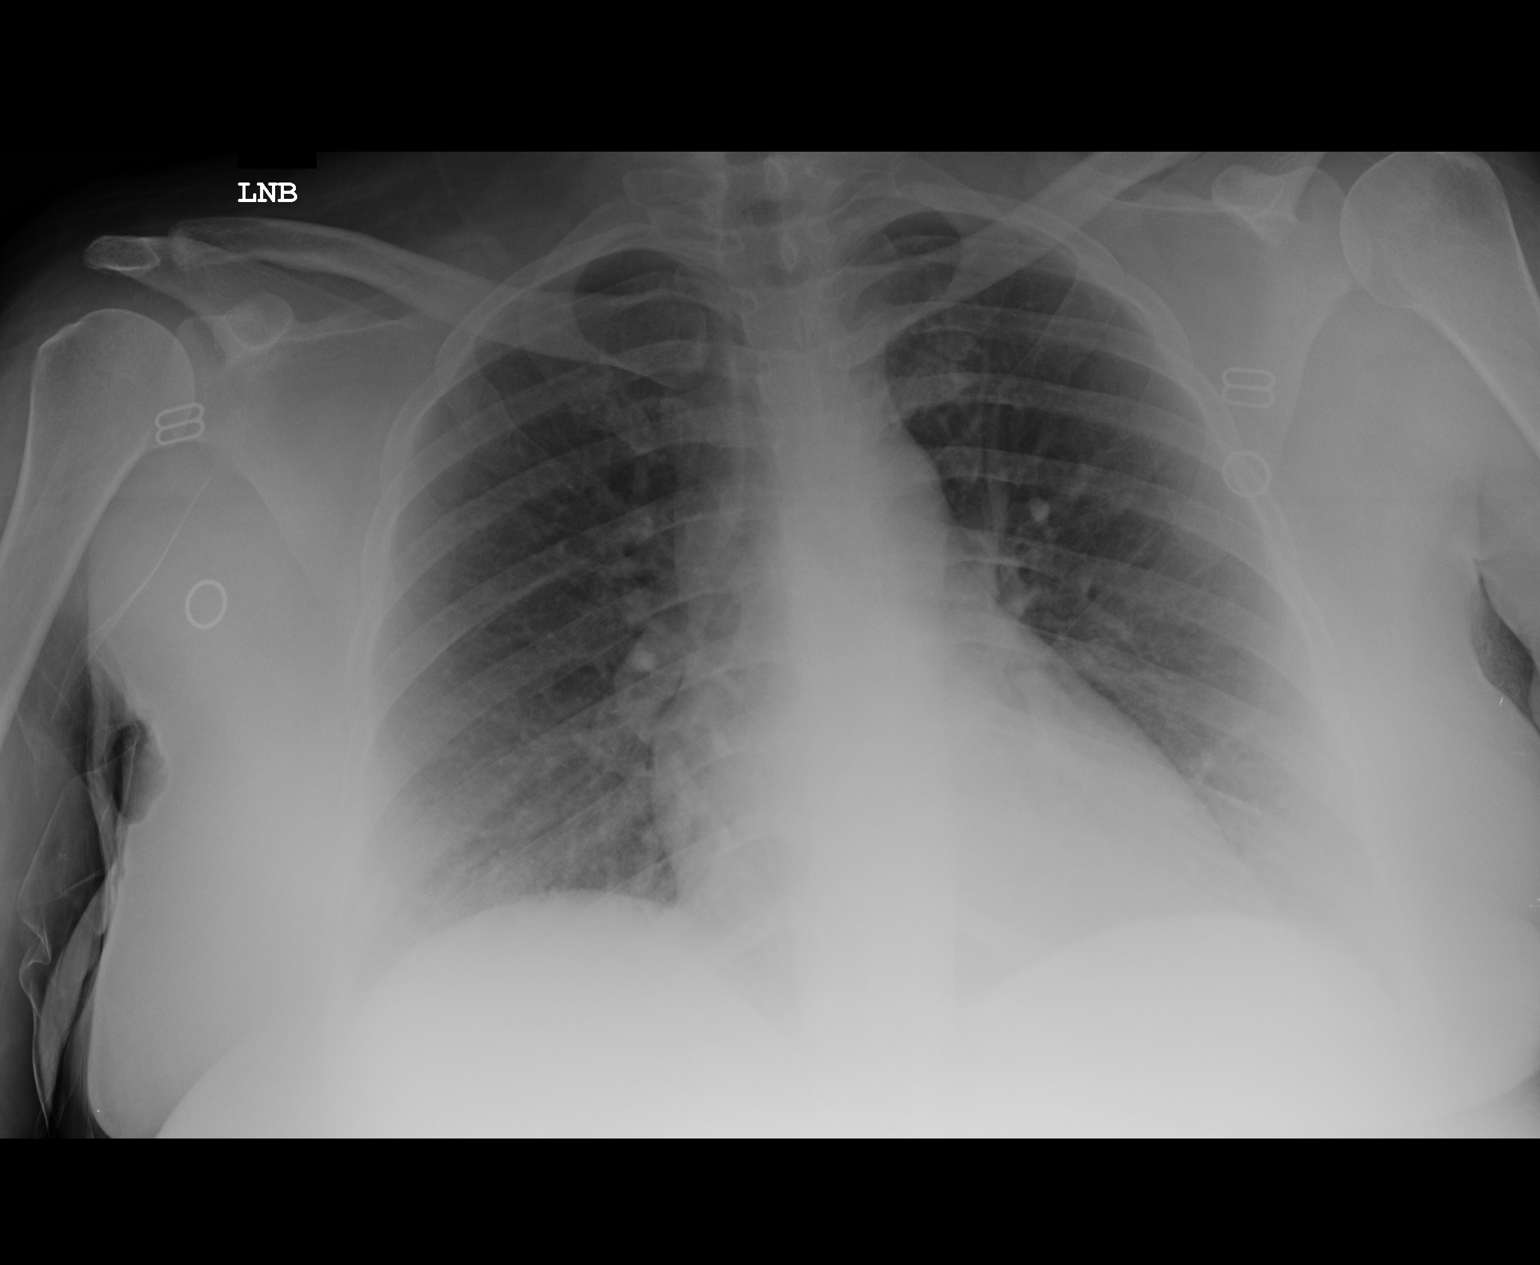

[1 of 1 positions shown; findings below may reference images not displayed]

PROCEDURE:     DXR - DXR PORTABLE CHEST SINGLE VIEW  - August 01, 2009  [DATE]

RESULT:     Comparison is made to the prior exam of 07/29/2009. The lung
fields are clear. No pneumonia, pneumothorax or pleural effusion is seen.
Heart size is normal. The mediastinal structures show no significant
abnormalities.
IMPRESSION: No significant abnormalities are noted.

## 2009-12-07 IMAGING — CR DG ABDOMEN 2V
1 series · 3 of 3 positions shown · non-contrast
Comparison: none

REASON FOR EXAM: pain
COMMENTS:

[Series 1: view not recorded · 0.17mm/px · 3 of 3 slices shown]
[im 1/3]
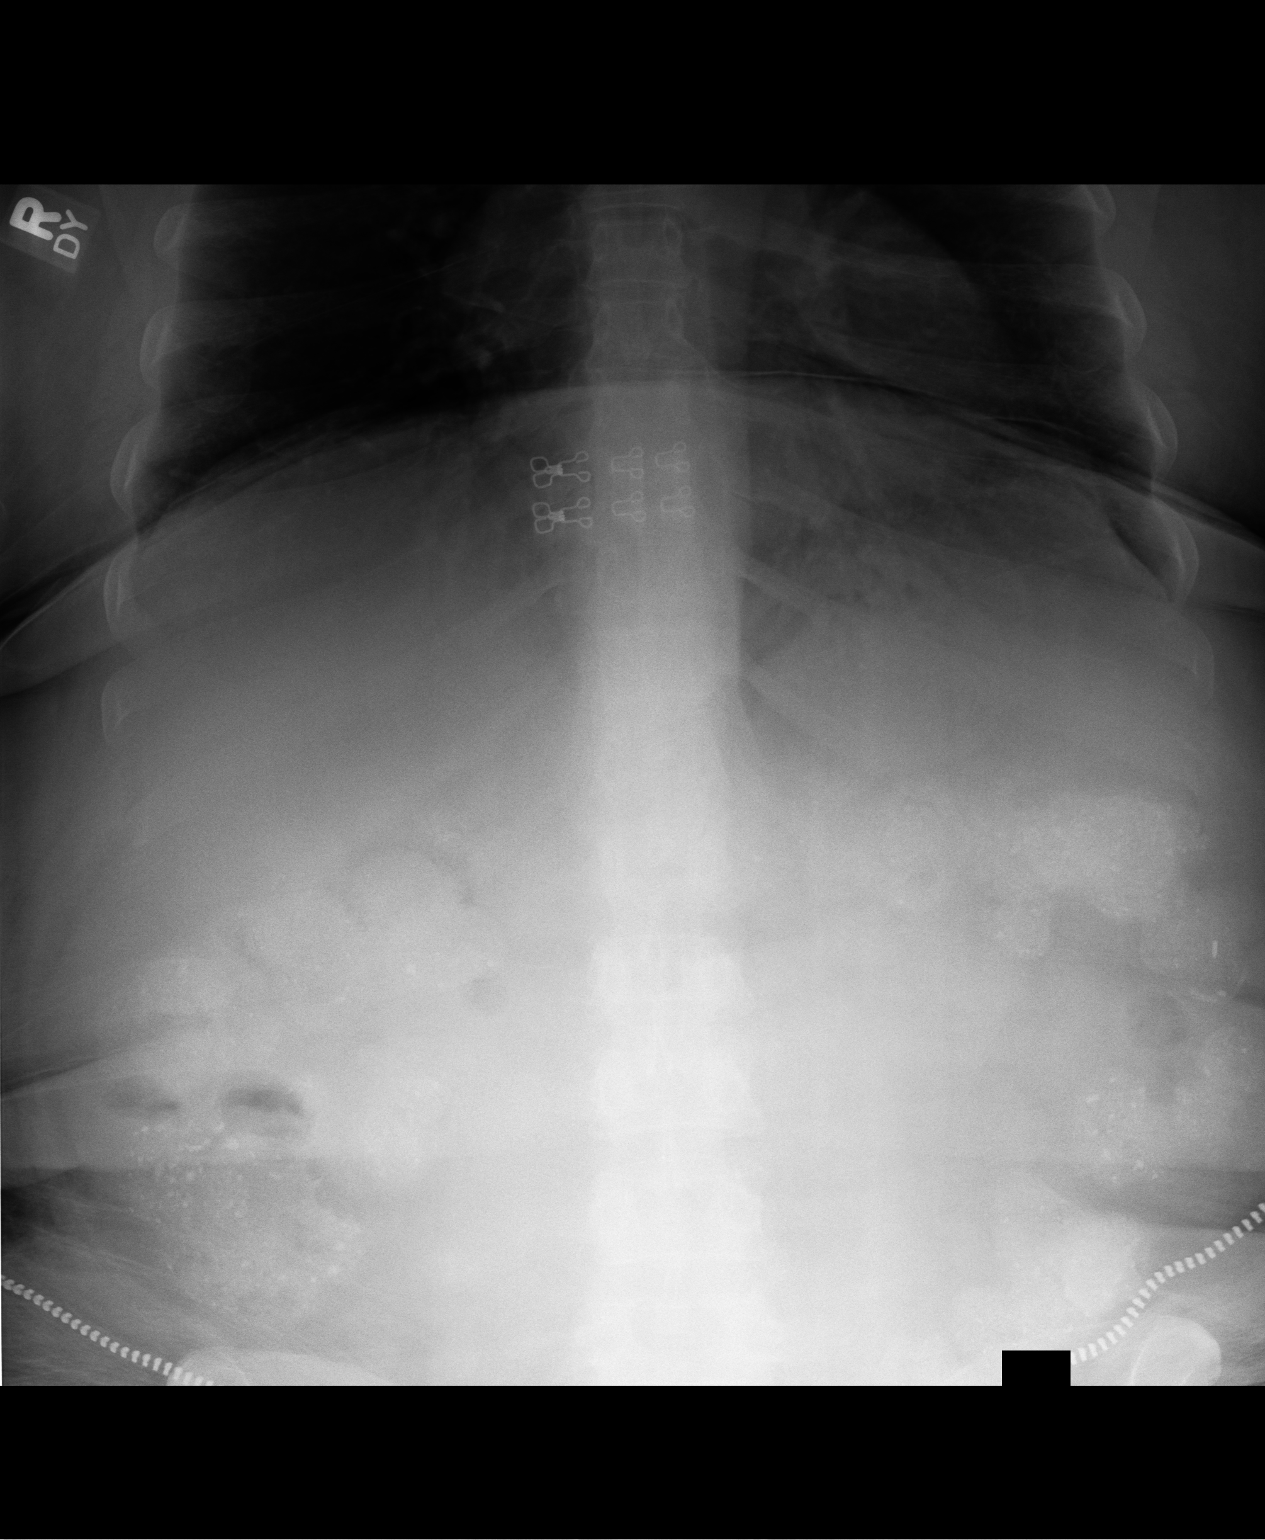
[im 2/3]
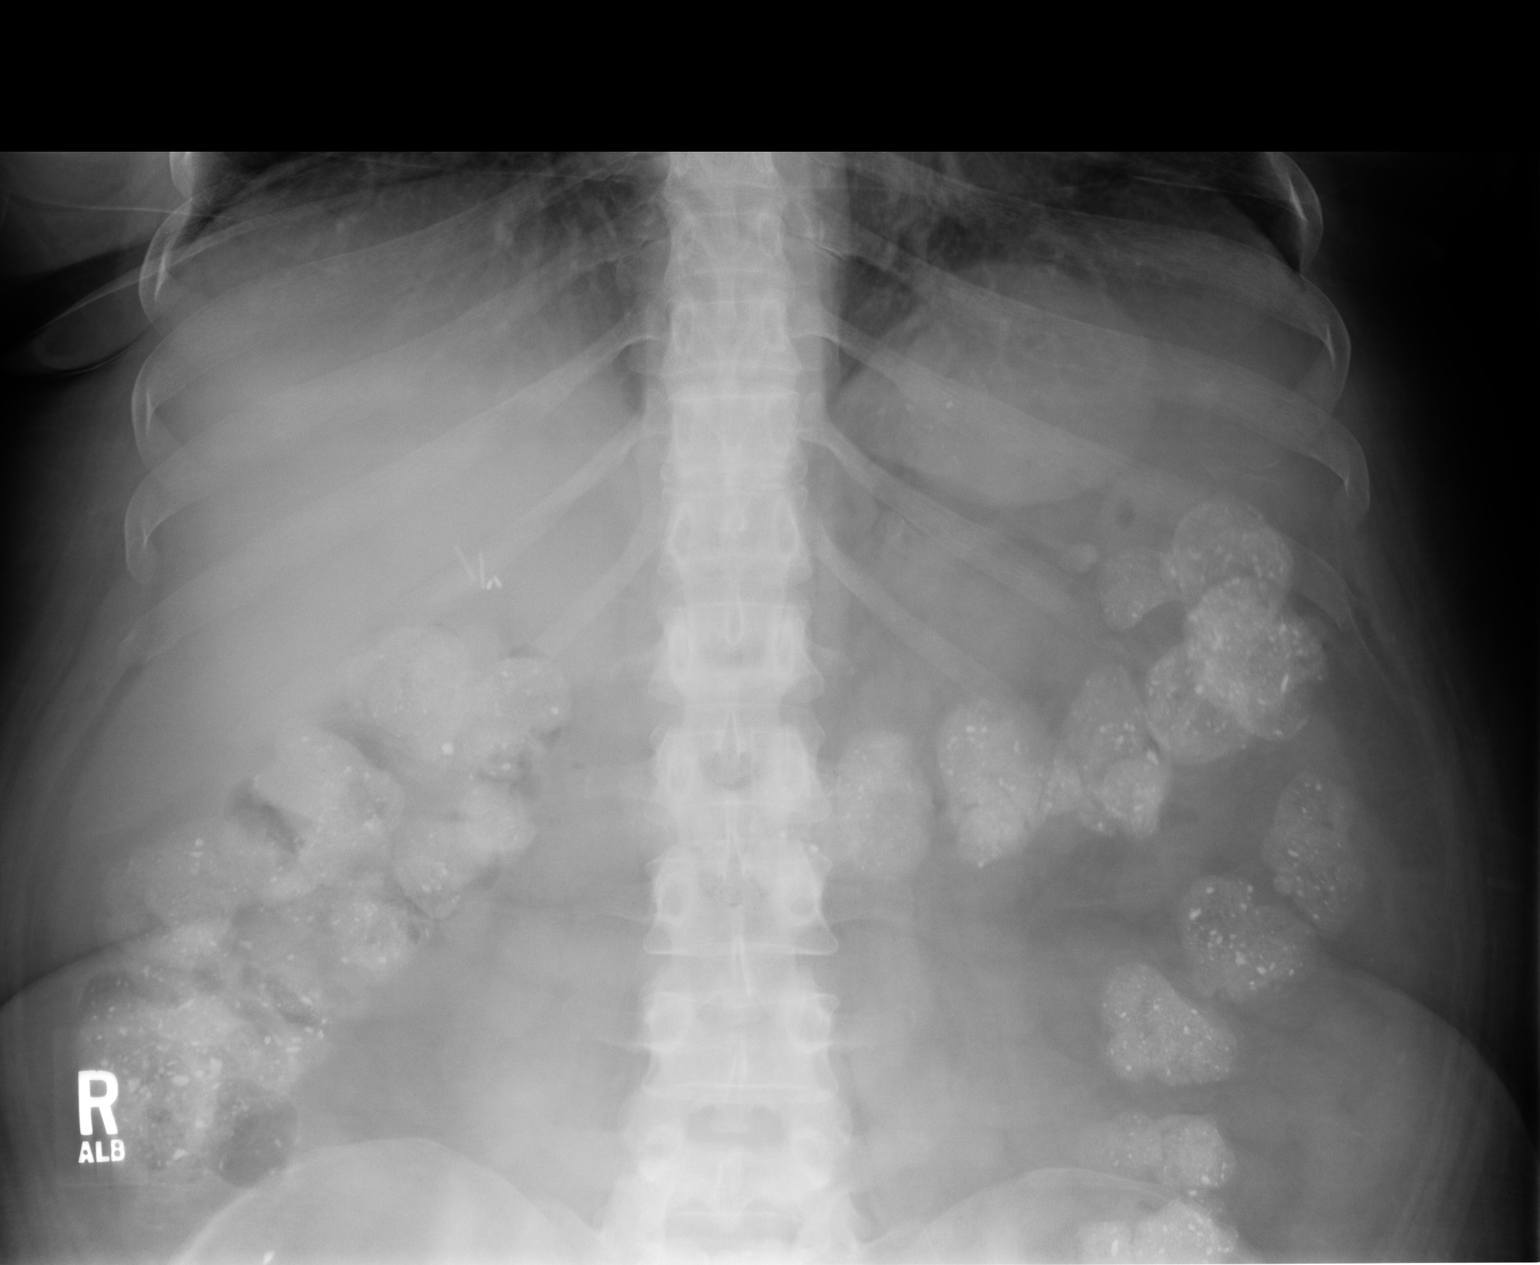
[im 3/3]
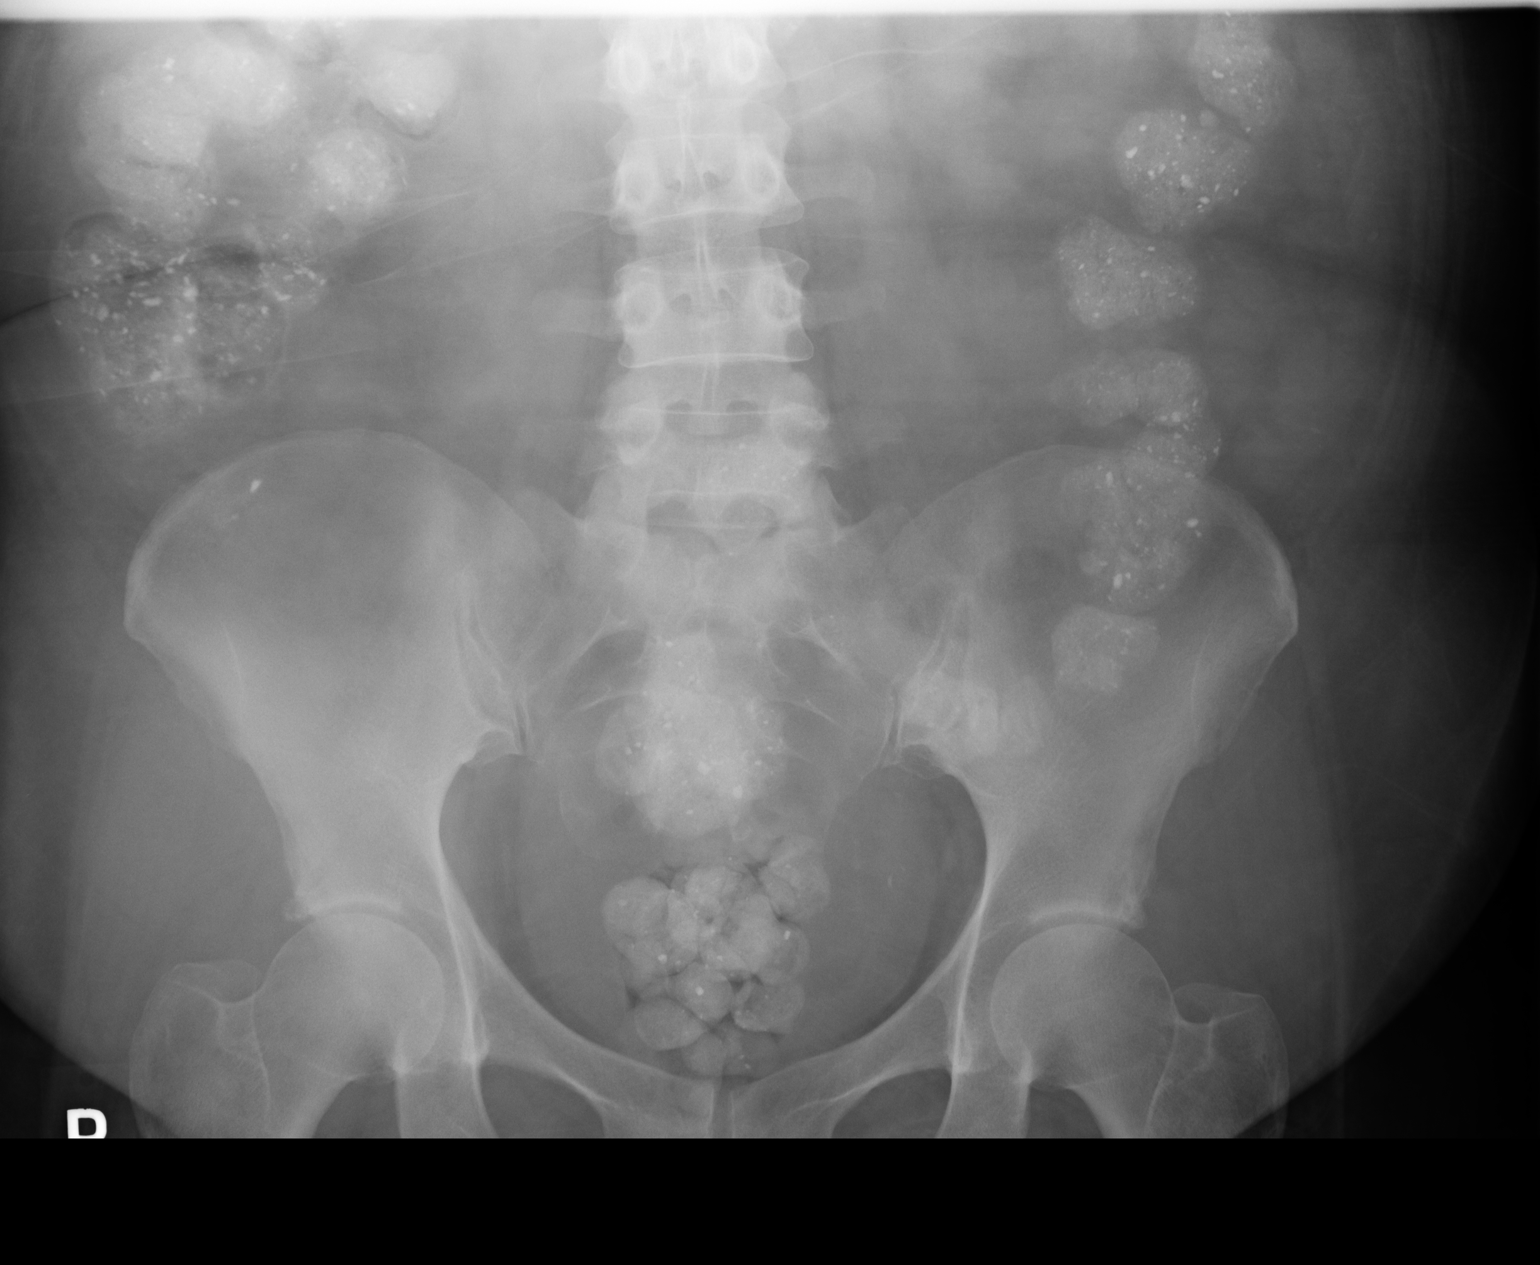

[3 of 3 positions shown; findings below may reference images not displayed]

PROCEDURE:     DXR - DXR ABDOMEN 2 V FLAT AND ERECT  - August 01, 2009  [DATE]

RESULT:     Comparison is made to a prior exam of 01/25/2009. There is again
noted opaque material in the GI tract, presumably related to radiopaque
medications. The bowel gas pattern is normal. No dilated loops of bowel are
seen. No evidence for bowel obstruction is noted. Postoperative metallic
clips are noted in the region of the gallbladder bed. No acute bony
abnormalities are seen.
IMPRESSION: 1. No acute changes are identified.
2. There is opaque material in the colon, some of that noted on the prior
exam of 01/25/2009 and likely related to opaque medications.

## 2009-12-08 IMAGING — XA IR VASCULAR PROCEDURE
1 series · 1 of 1 positions shown · non-contrast
Comparison: none

[Series 1: single · 1 of 1 slices shown]
[im 1/1]
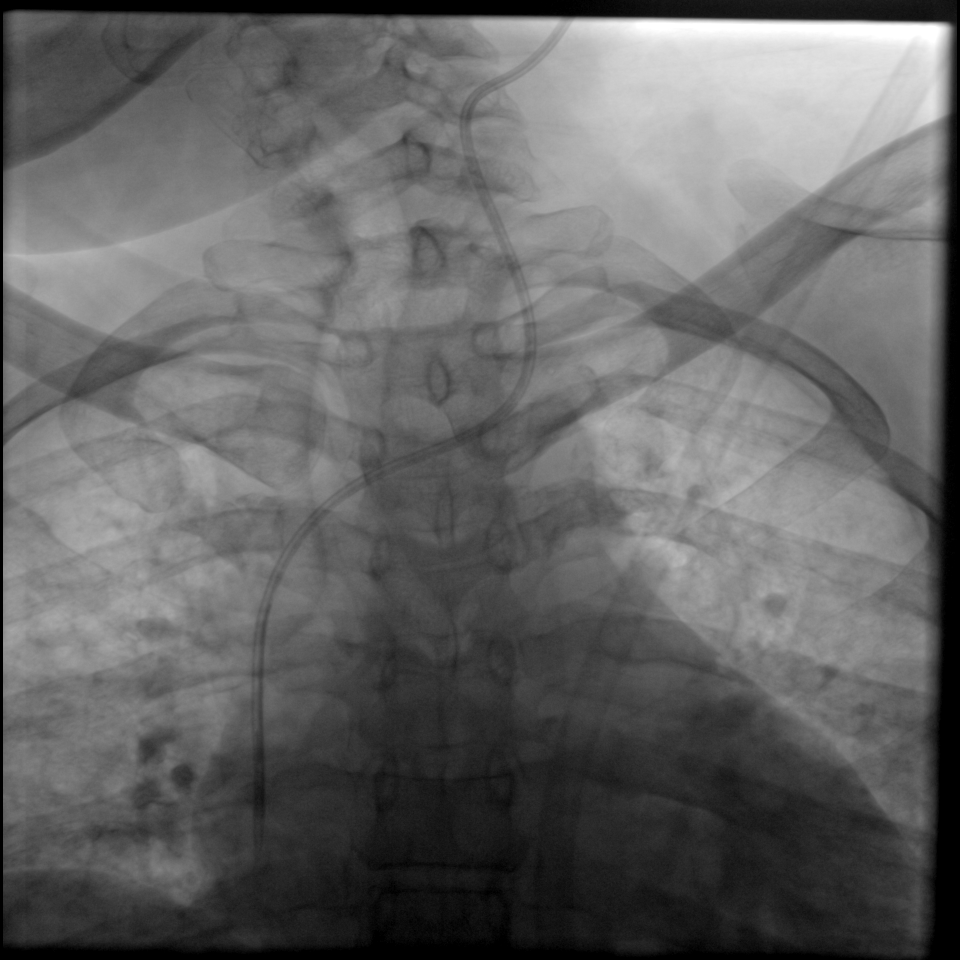

[1 of 1 positions shown; findings below may reference images not displayed]

IMAGES IMPORTED FROM THE [employer] WORK[employer]OW SYSTEM
NO DICTATION FOR STUDY

## 2009-12-09 IMAGING — XA IR VASCULAR PROCEDURE
13 of 18 series · 15 of 24 positions shown · non-contrast
Comparison: none

[Series 1: forearm · 1 of 2 slices shown (1 of 10)]
[im 1/2]
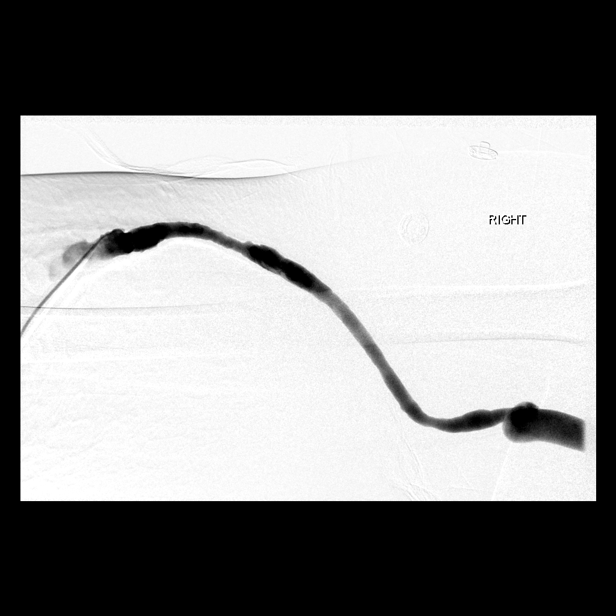

[Series 3: forearm · 1 of 2 slices shown (2 of 10)]
[im 1/2]
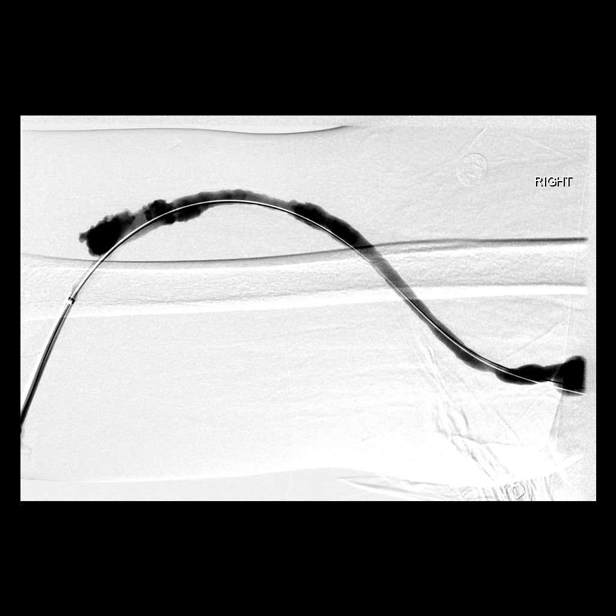

[Series 6: forearm · 1 of 2 slices shown (3 of 10)]
[im 1/2]
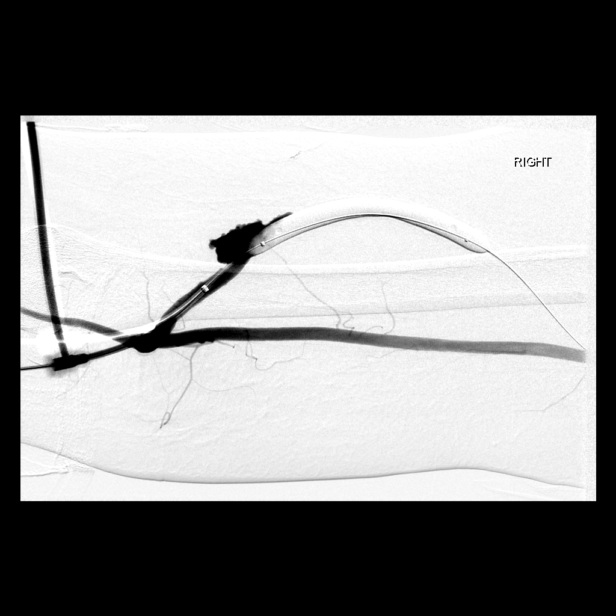

[Series 7: forearm · 1 of 2 slices shown (4 of 10)]
[im 1/2]
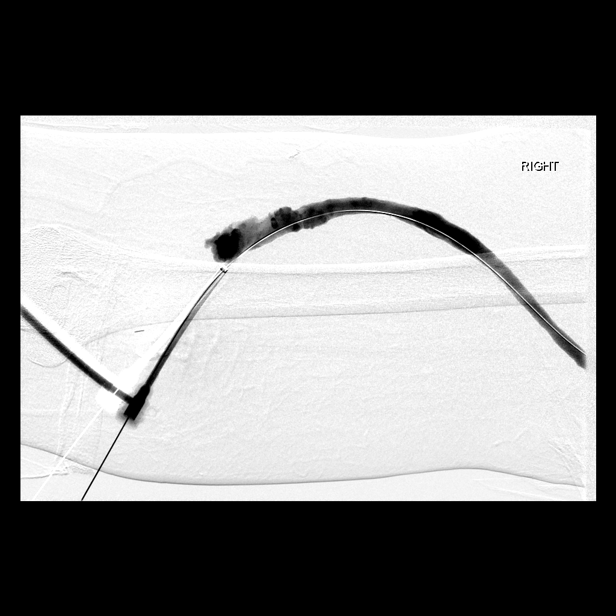

[Series 8: forearm · 1 of 2 slices shown (5 of 10)]
[im 2/2]
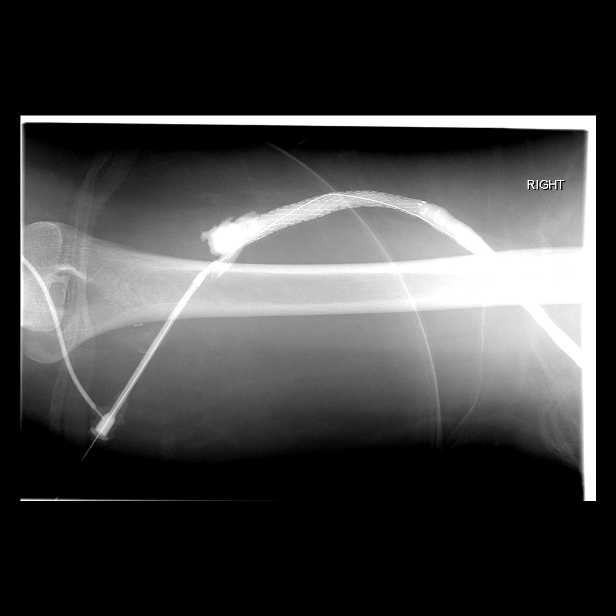

[Series 9: fl  angio · 1 of 1 slices shown (1 of 3)]
[im 1/1]
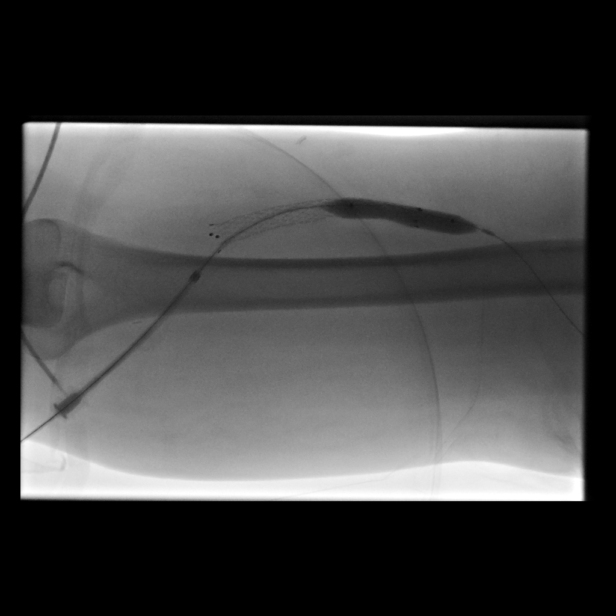

[Series 11: fl  angio · 1 of 1 slices shown (2 of 3)]
[im 1/1]
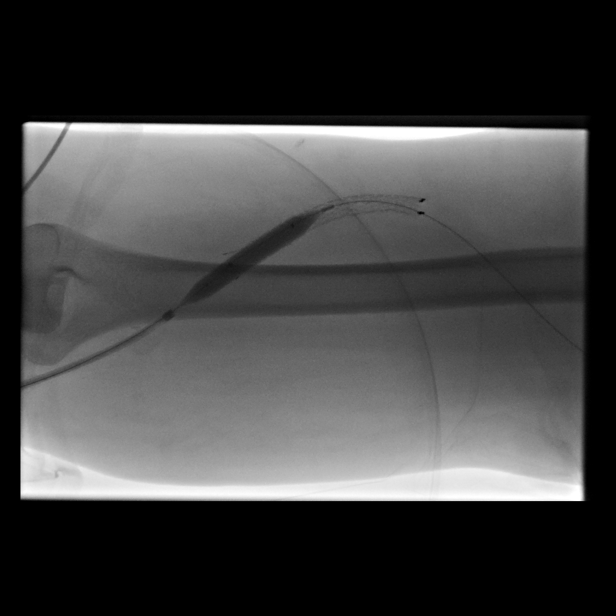

[Series 12: forearm · 1 of 2 slices shown (6 of 10)]
[im 2/2]
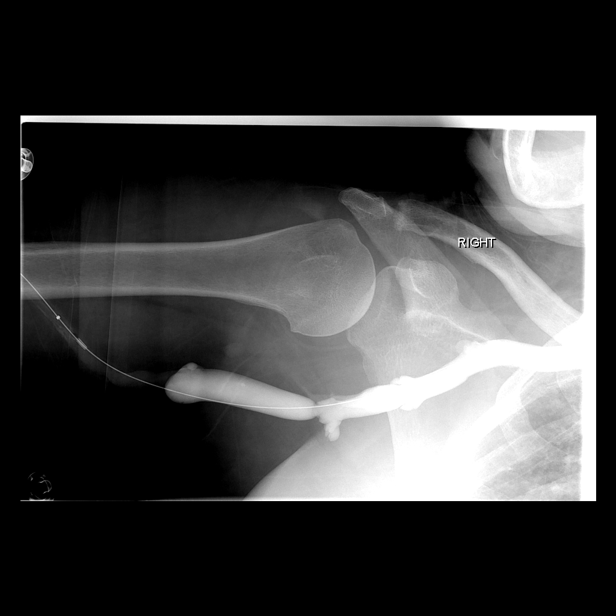

[Series 13: fl  angio · 1 of 1 slices shown (3 of 3)]
[im 1/1]
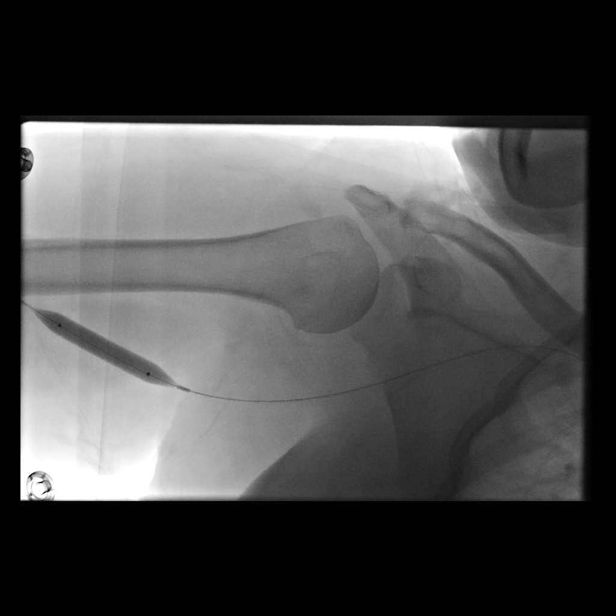

[Series 15: forearm · 2 of 2 slices shown (7 of 10)]
[im 1/2]
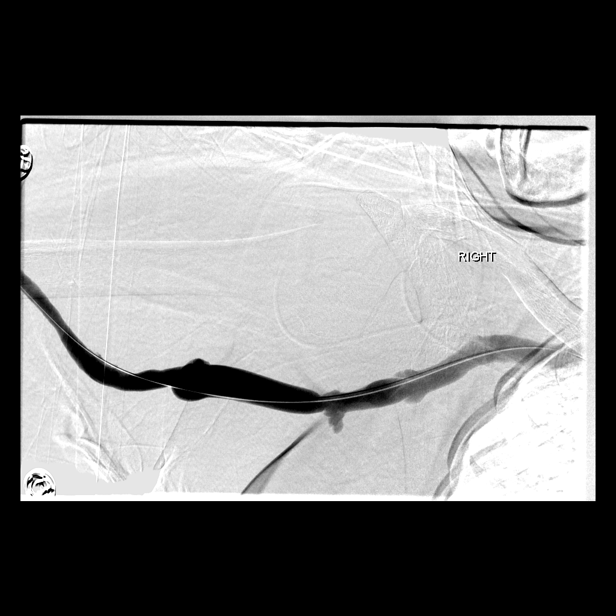
[im 2/2]
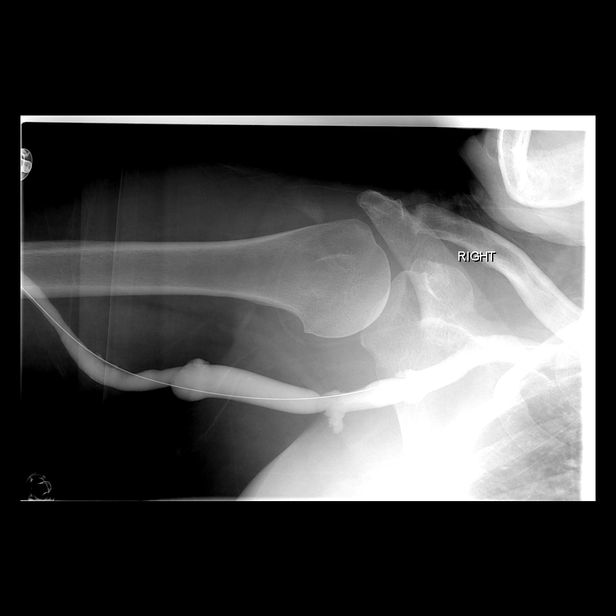

[Series 16: forearm · 1 of 2 slices shown (8 of 10)]
[im 2/2]
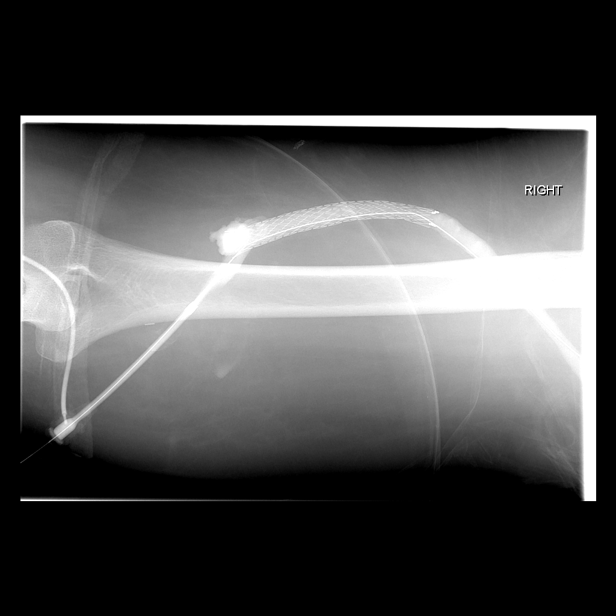

[Series 18: forearm · 2 of 2 slices shown (9 of 10)]
[im 1/2]
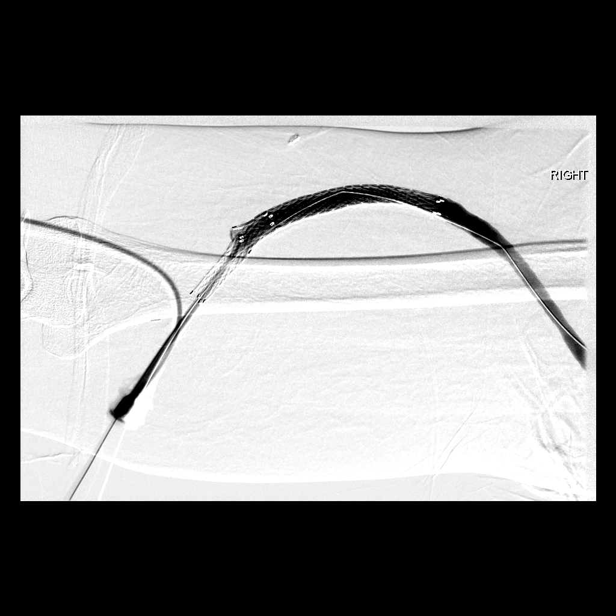
[im 2/2]
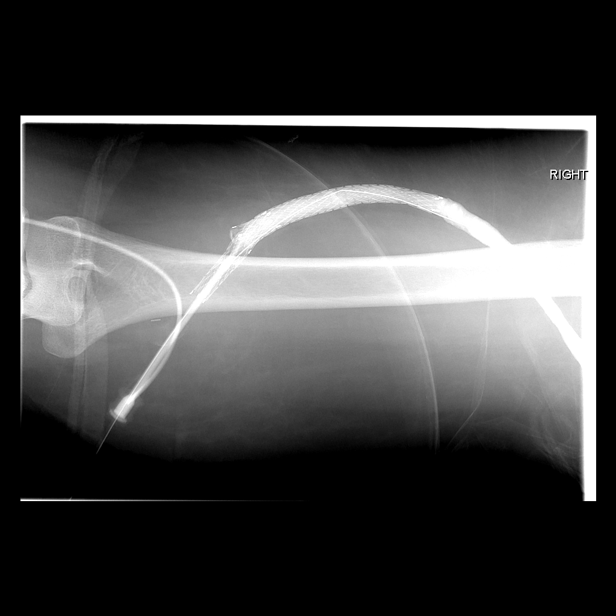

[Series 19: forearm · 1 of 2 slices shown (10 of 10)]
[im 2/2]
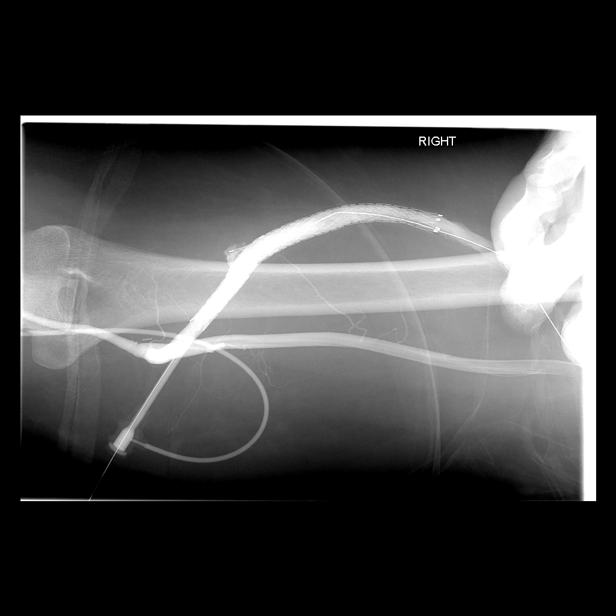

[15 of 24 positions shown; findings below may reference images not displayed]

IMAGES IMPORTED FROM THE [employer] WORK[employer]OW SYSTEM
NO DICTATION FOR STUDY

## 2009-12-10 IMAGING — CR DG ABDOMEN 2V
1 series · 3 of 3 positions shown · non-contrast
Comparison: none

REASON FOR EXAM: vomitting
COMMENTS:

[Series 1: view not recorded · 0.17mm/px · 3 of 3 slices shown]
[im 1/3]
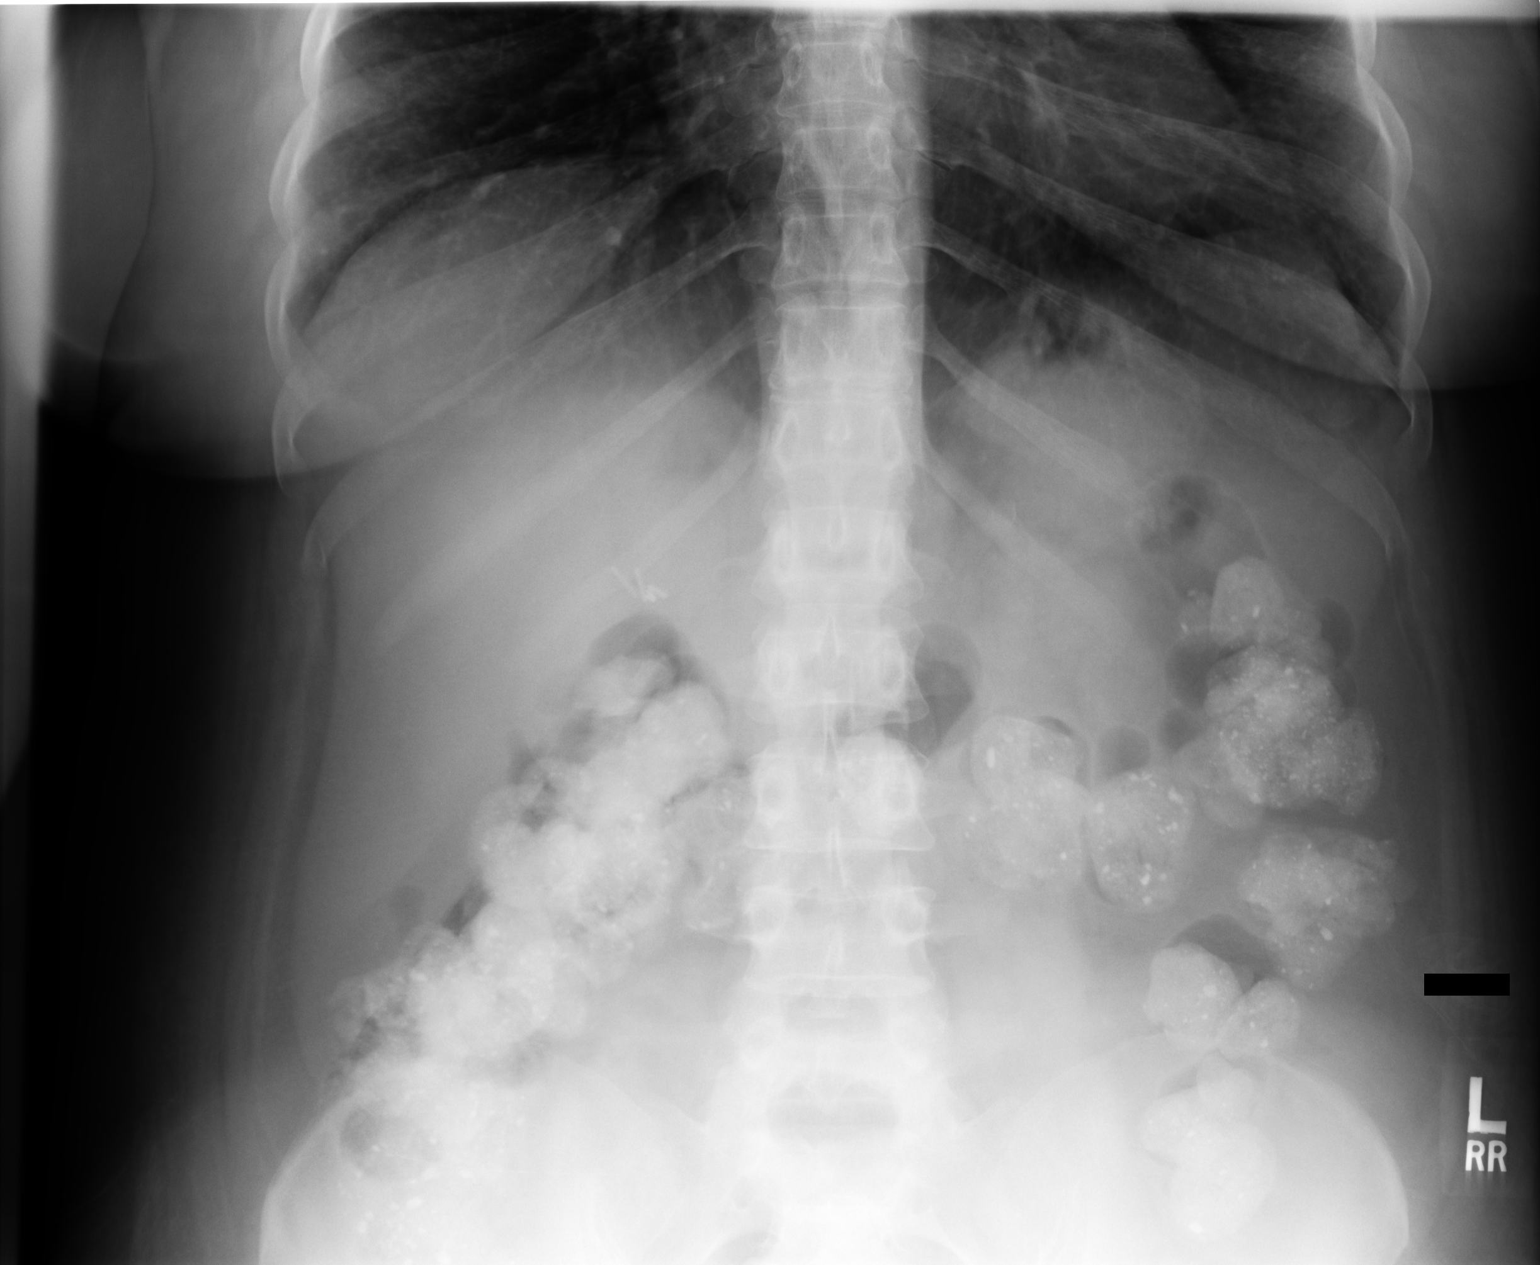
[im 2/3]
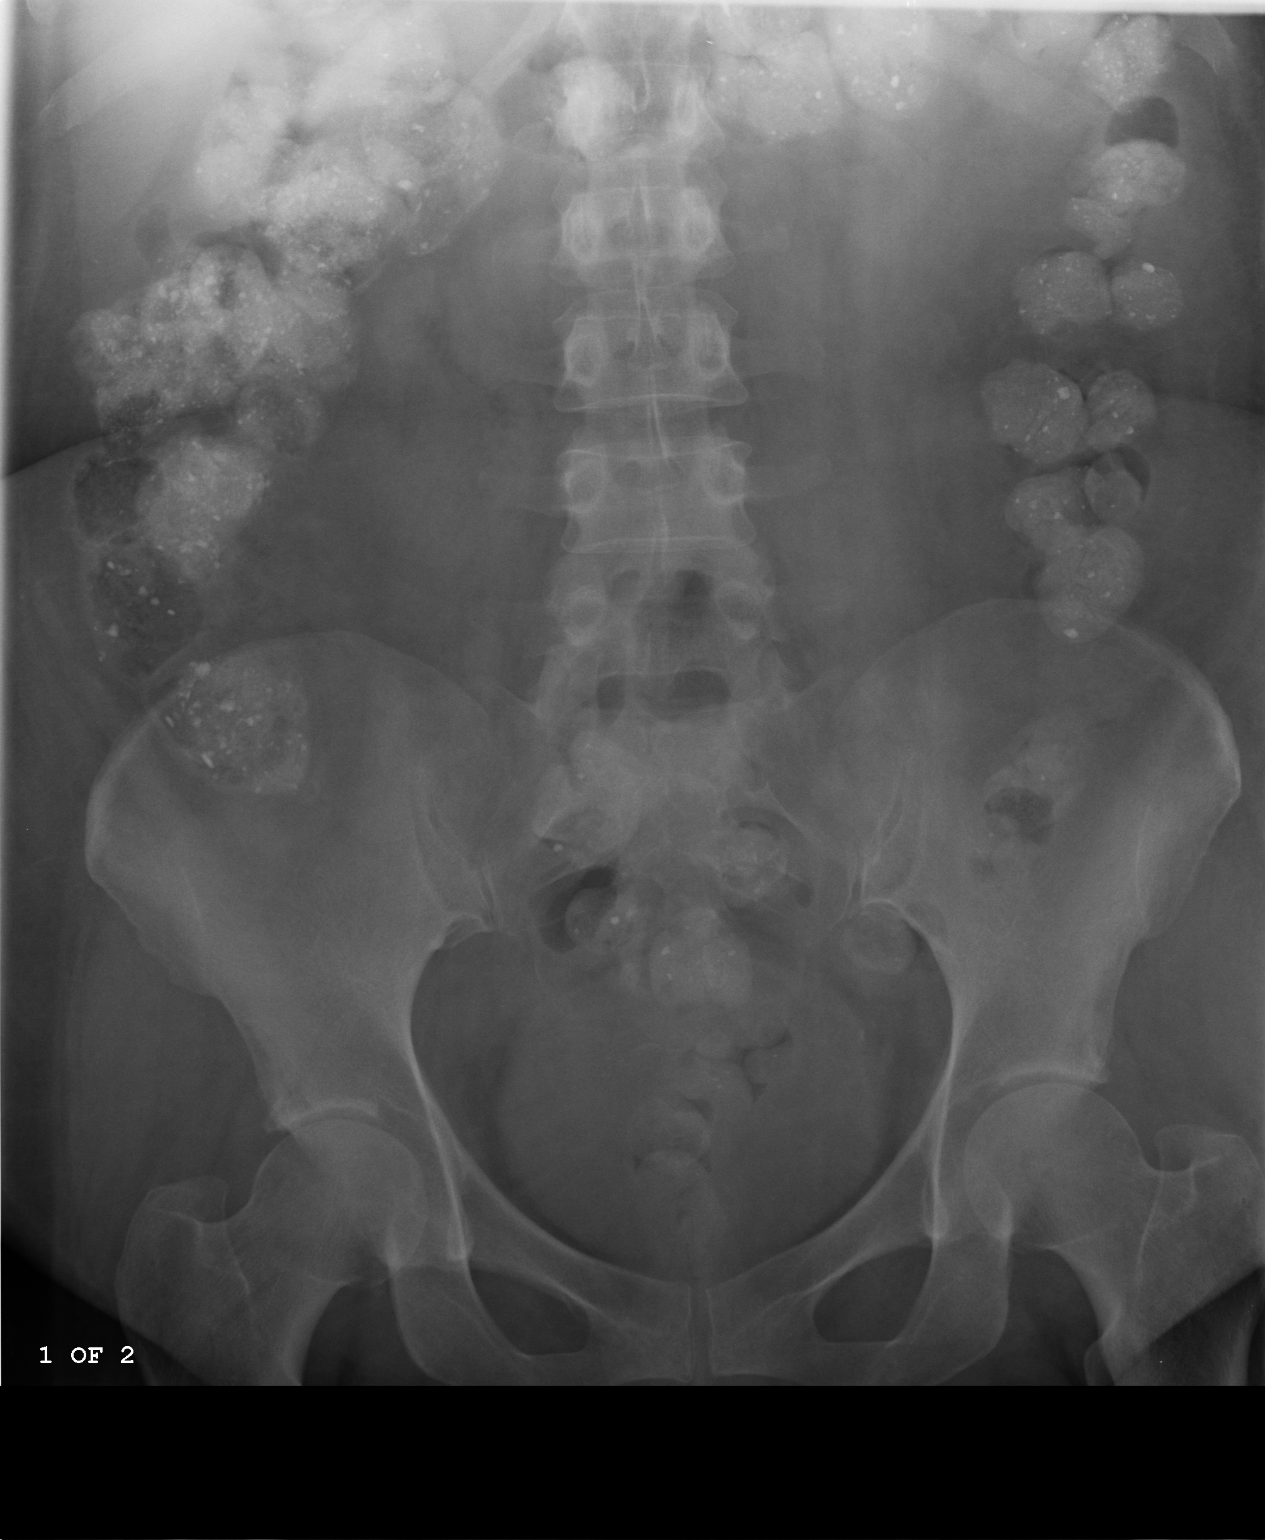
[im 3/3]
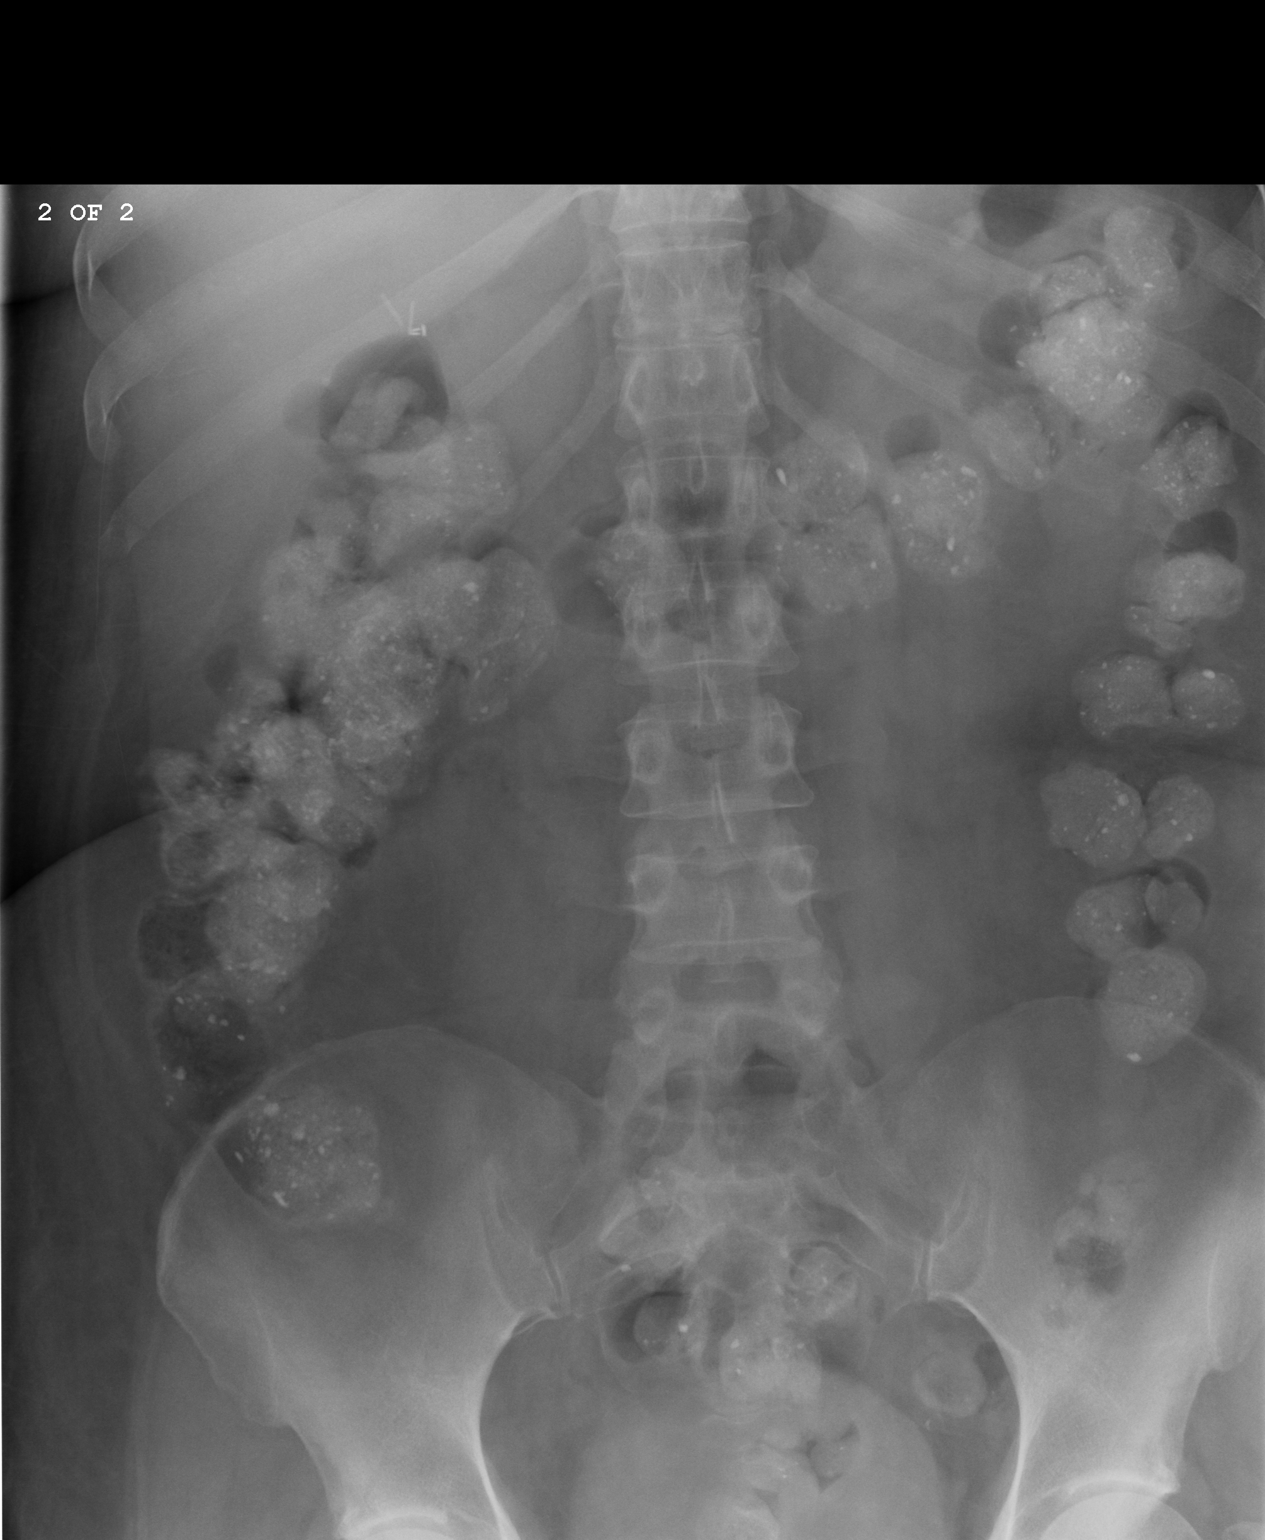

[3 of 3 positions shown; findings below may reference images not displayed]

PROCEDURE:     DXR - DXR ABDOMEN 2 V FLAT AND ERECT  - August 04, 2009  [DATE]

RESULT:     The soft tissue structures are unremarkable. Contrast is noted
in the colon. The gas pattern is nonspecific. There is no evidence of bowel
distention. The bony structures are unremarkable. There is no free air.
IMPRESSION: Nonspecific exam. The exam is stable from 08/01/2009. The
patient has had a prior cholecystectomy.

## 2009-12-15 IMAGING — CT CT ANGIOGRAPHY ABDOMEN
4 of 5 series · 13 of 36 positions shown, 17 images · IV contrast (APPLIED)
Comparison: none

REASON FOR EXAM: abdominal pain
COMMENTS:

[Series 6: webspace arterial · axial · arterial · 0.73mm/px · z∈[-371,-29]mm · 8 of 856 slices shown]
[im 86/856  soft-tissue]
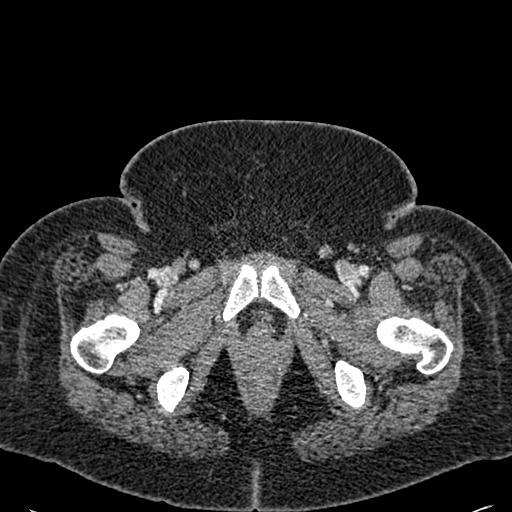
[im 172/856  soft-tissue]
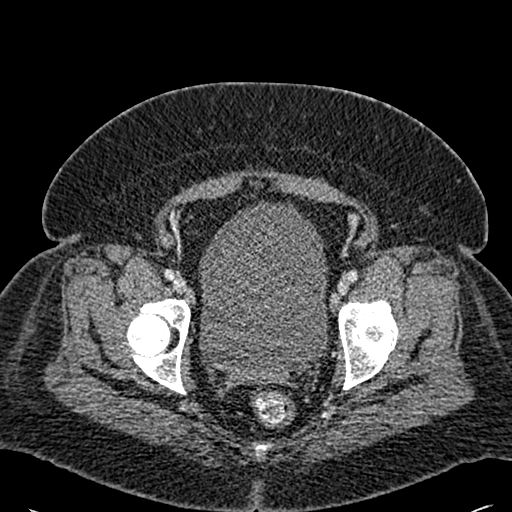
[im 257/856  soft-tissue]
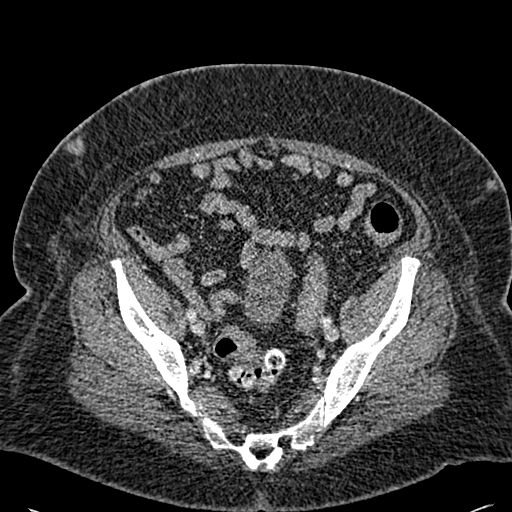
[im 385/856  soft-tissue]
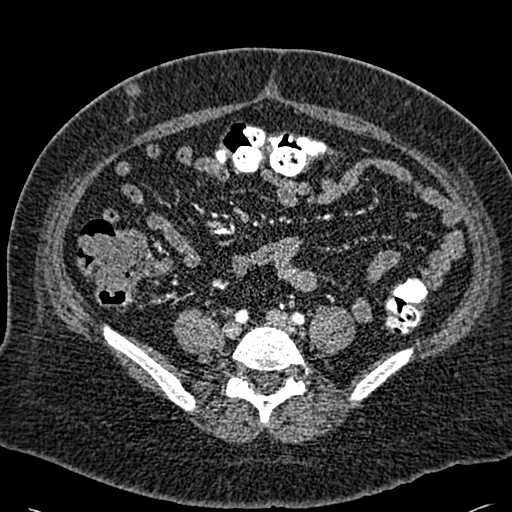
[im 471/856  soft-tissue]
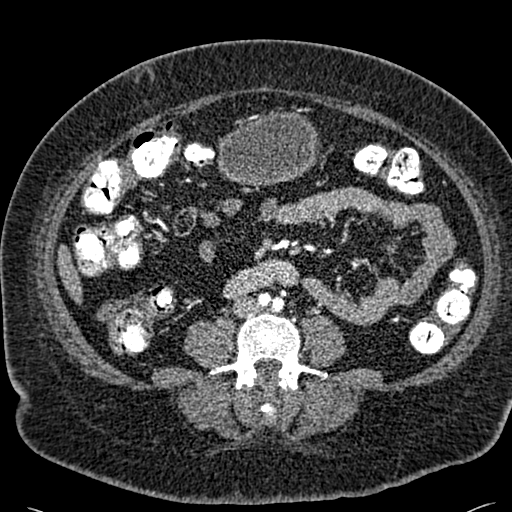
[im 599/856  soft-tissue]
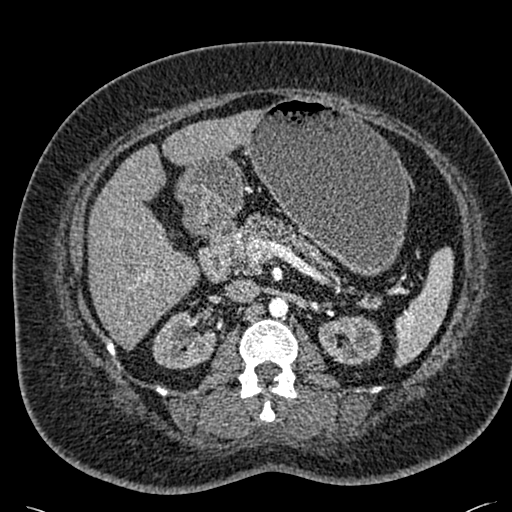
[im 685/856  soft-tissue]
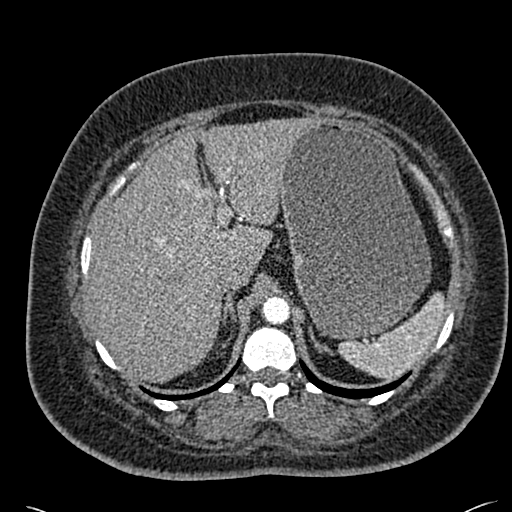
[im 770/856  soft-tissue]
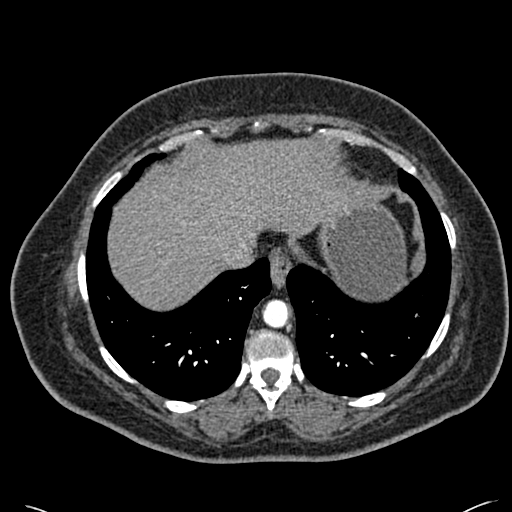

[Series 7: venous · axial · portal-venous · 0.73mm/px · z∈[-272,-130]mm · 2 of 143 slices shown, 5 images]
[im 48/143  soft-tissue]
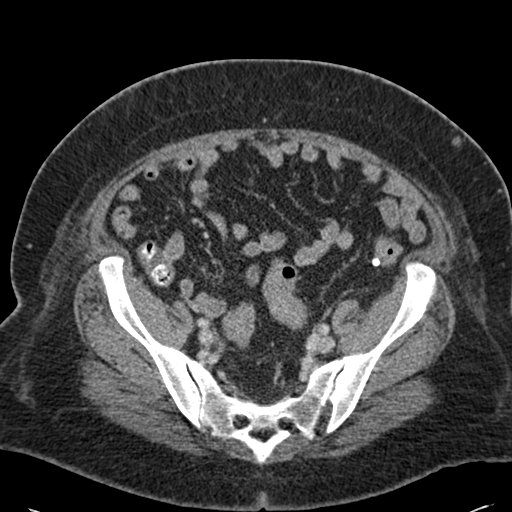
[im 48/143  lung]
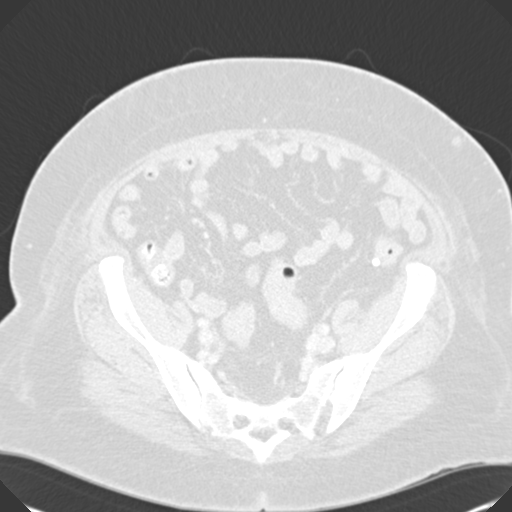
[im 48/143  bone]
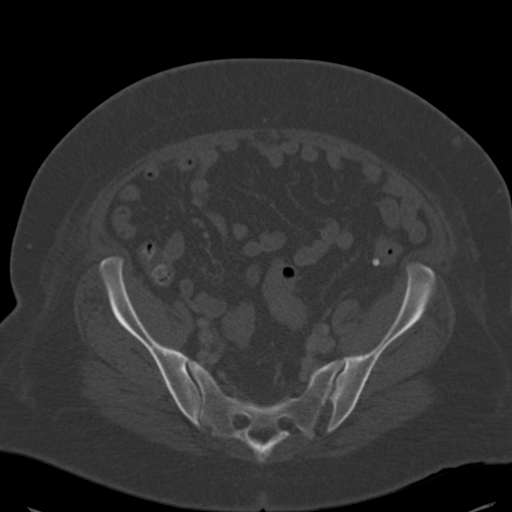
[im 95/143  soft-tissue]
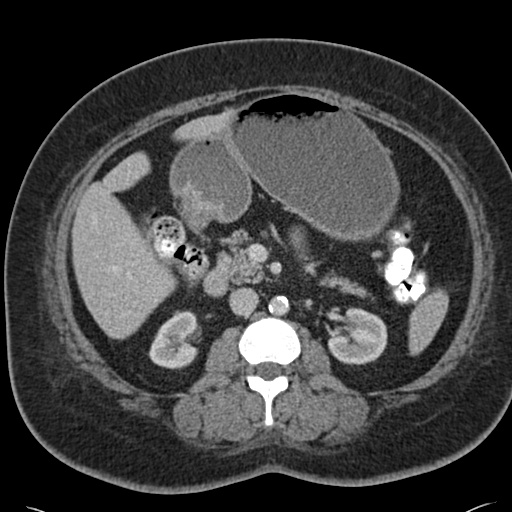
[im 95/143  lung]
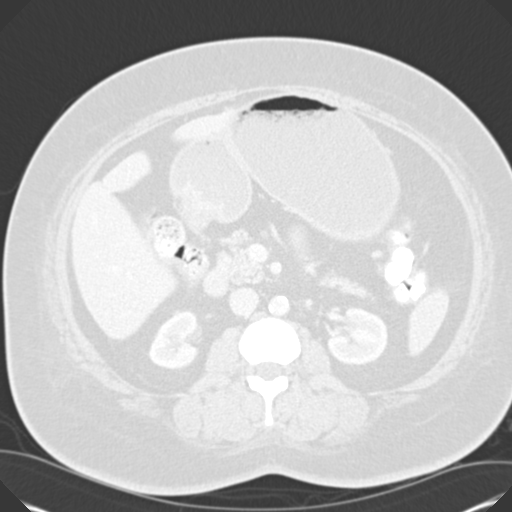

[Series 9: lung · axial · 0.73mm/px · z∈[-271,-130]mm · 2 of 143 slices shown]
[im 48/143  bone]
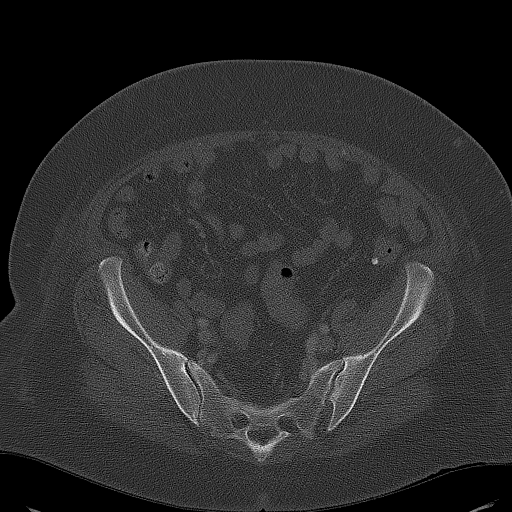
[im 95/143  bone]
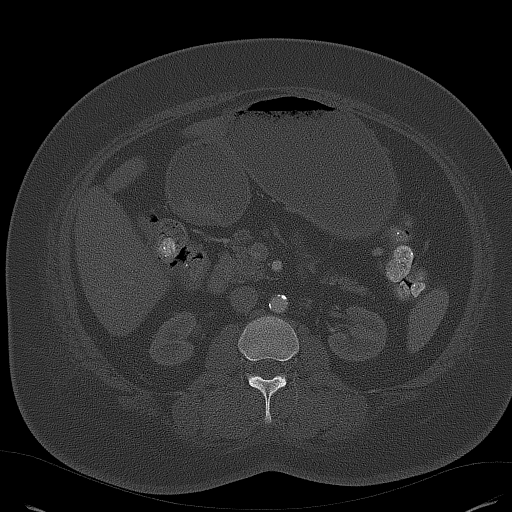

[Series 604: sag mips · sagittal · 0.84mm/px · 1 of 142 slices shown, 2 images]
[im 48/142  soft-tissue]
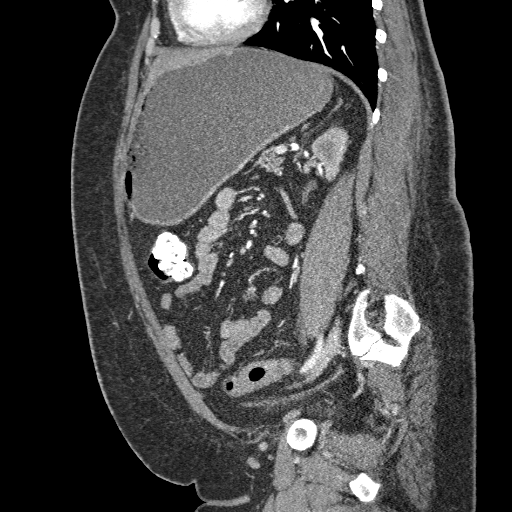
[im 48/142  bone]
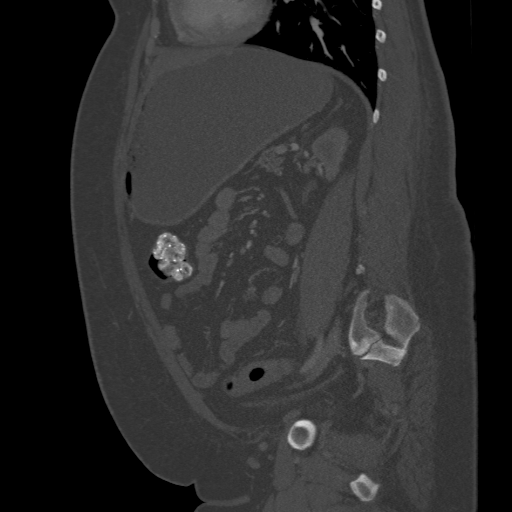

[13 of 36 positions shown; findings below may reference images not displayed]

PROCEDURE:     CT  - CT ANGIOGRAPHY ABDOMEN ONLY WITH  - August 09, 2009  [DATE]

RESULT:     CTA of the abdomen is performed. There is contrast in the colon
which may limit resolution somewhat especially on three-dimensional
reconstructions. The aorta is normal in caliber with no evidence of
dissection. The inferior mesenteric, superior mesenteric and celiac arteries
opacify as do both renal arteries. The common iliacs, internal iliacs and
external iliacs appear to be grossly unremarkable although they are somewhat
less well seen. The splenic vein and superior mesenteric vein opacify
normally as does the portal vein. There is no acute inflammation evident by
CT. There is no CT evidence of acute pancreatitis. Cholecystectomy changes
are present. There is a moderately large amount of fluid in the stomach.
There is no evidence of stenosis at the origin of the celiac axis or
superior mesenteric artery. There is no evidence of hemorrhage. The urinary
bladder is distended mildly. There is no abdominal or pelvic mass
appreciated. There is no evidence of adenopathy. The kidneys show no
obstruction or mass.
IMPRESSION: 1. No evidence of aneurysm.
2. Cholecystectomy changes.
3. No significant stenosis.
4. Atherosclerotic changes are present.
5. No CT evidence of acute pancreatitis.
6. The superior mesenteric, splenic and portal venous structures opacify
normally.

## 2009-12-23 IMAGING — CR DG CHEST 2V
1 series · 2 of 2 positions shown · non-contrast
Comparison: none

REASON FOR EXAM: hypoxia, cough, history of vomitting
COMMENTS:

[Series 1: view not recorded · 0.17mm/px · 2 of 2 slices shown]
[im 1/2]
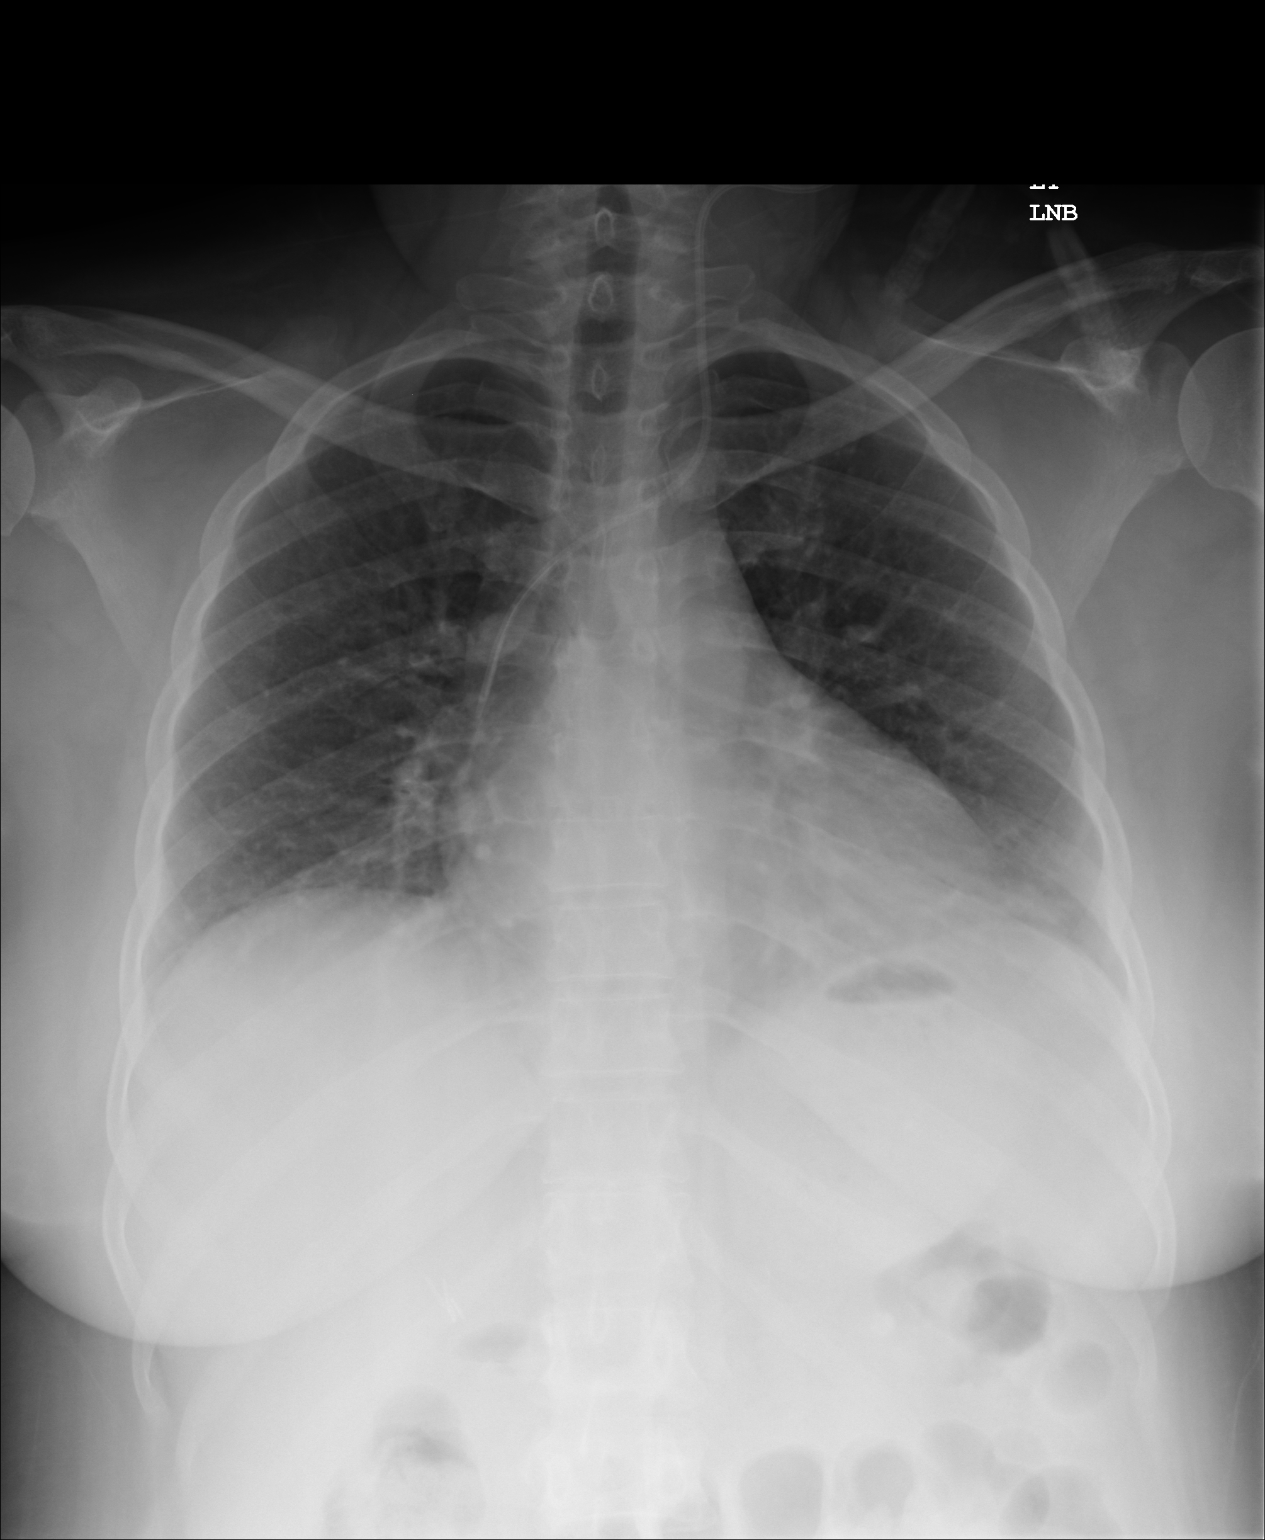
[im 2/2]
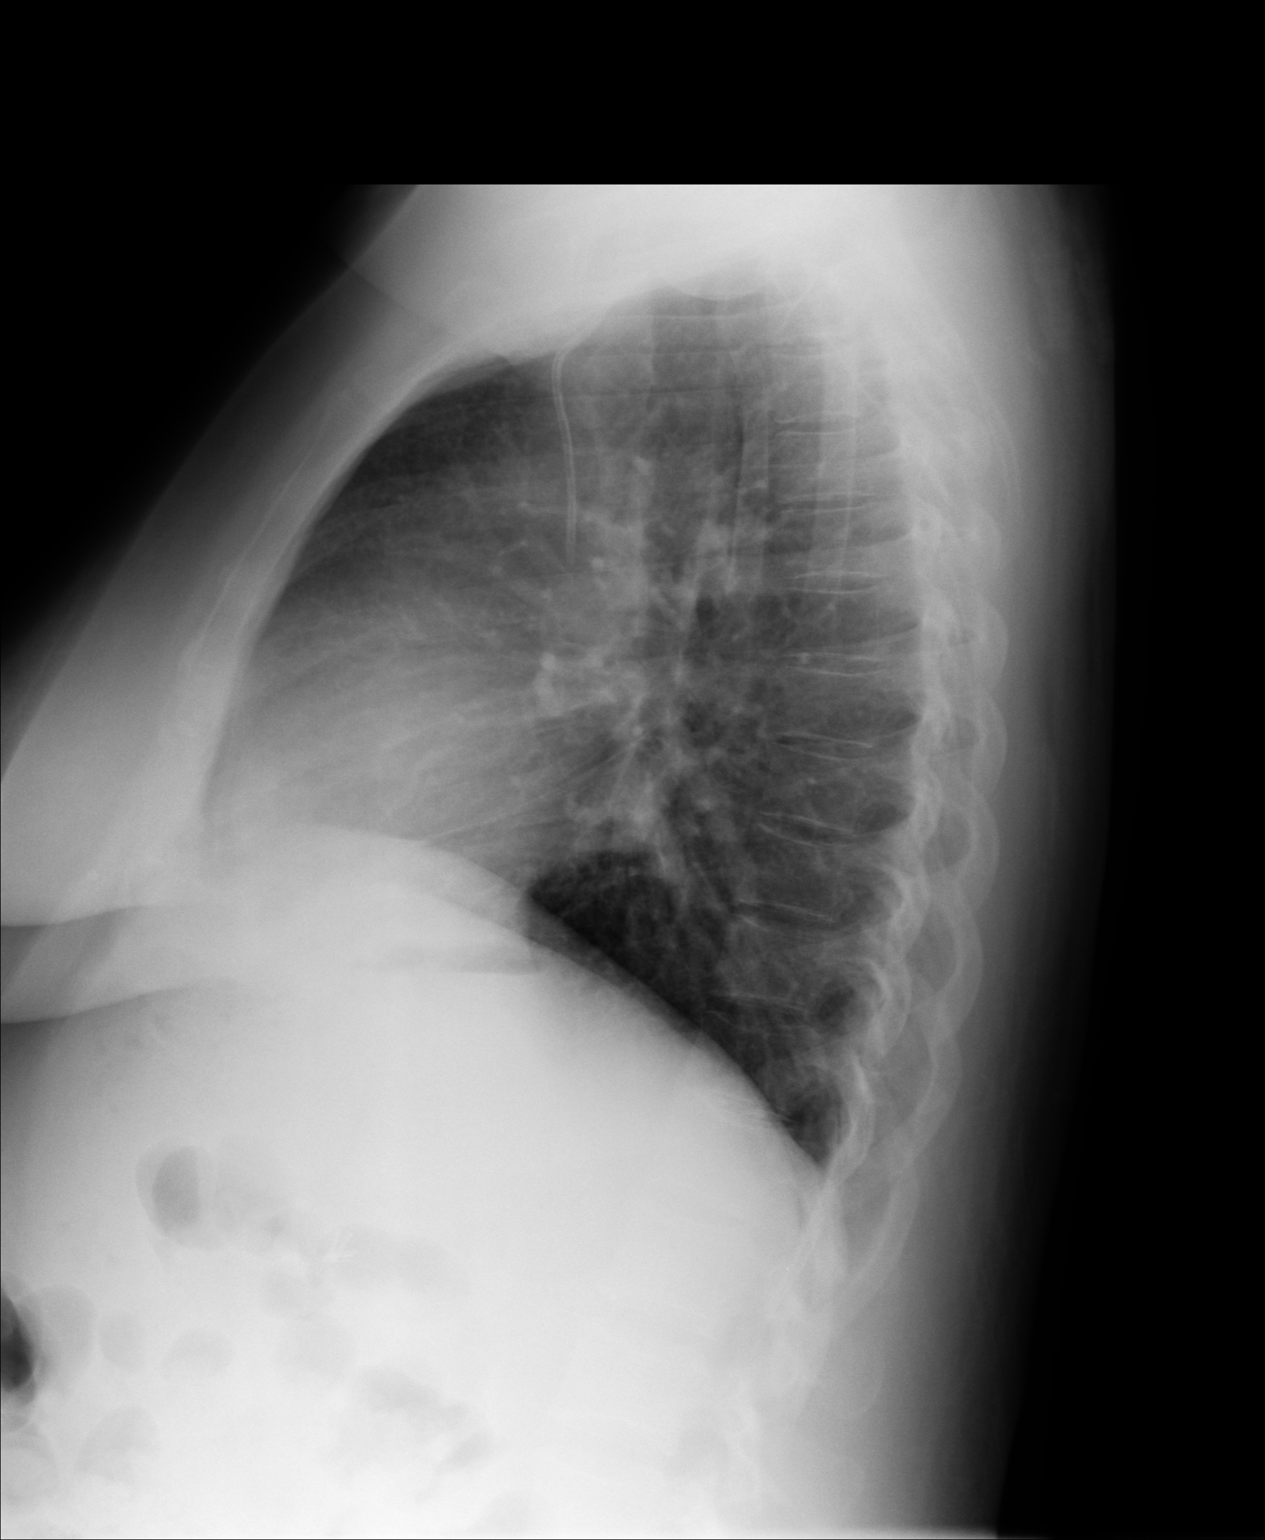

[2 of 2 positions shown; findings below may reference images not displayed]

PROCEDURE:     DXR - DXR CHEST PA (OR AP) AND LATERAL  - August 17, 2009  [DATE]

RESULT:     Comparison is made to a prior study same date earlier time.

In the interim a left-sided central venous catheter has been placed with tip
projecting in the region of the superior vena cava. There is mild thickening
of the interstitial markings and mild peribronchial cuffing. The patient has
taken a shallow inspiration. The cardiac silhouette is moderately enlarged.
The visualized bony skeleton is unremarkable.
IMPRESSION: 1. Interval placement of a left-sided central venous catheter. There is no
evidence of pneumothorax.
2. Pulmonary vascular congestion/mild edema. No focal regions of
consolidation.

## 2010-01-02 IMAGING — CR DG ABDOMEN 1V
1 series · 2 of 2 positions shown · non-contrast
Comparison: none

REASON FOR EXAM: n/v
COMMENTS:

[Series 1: view not recorded · 0.17mm/px · 2 of 2 slices shown]
[im 1/2]
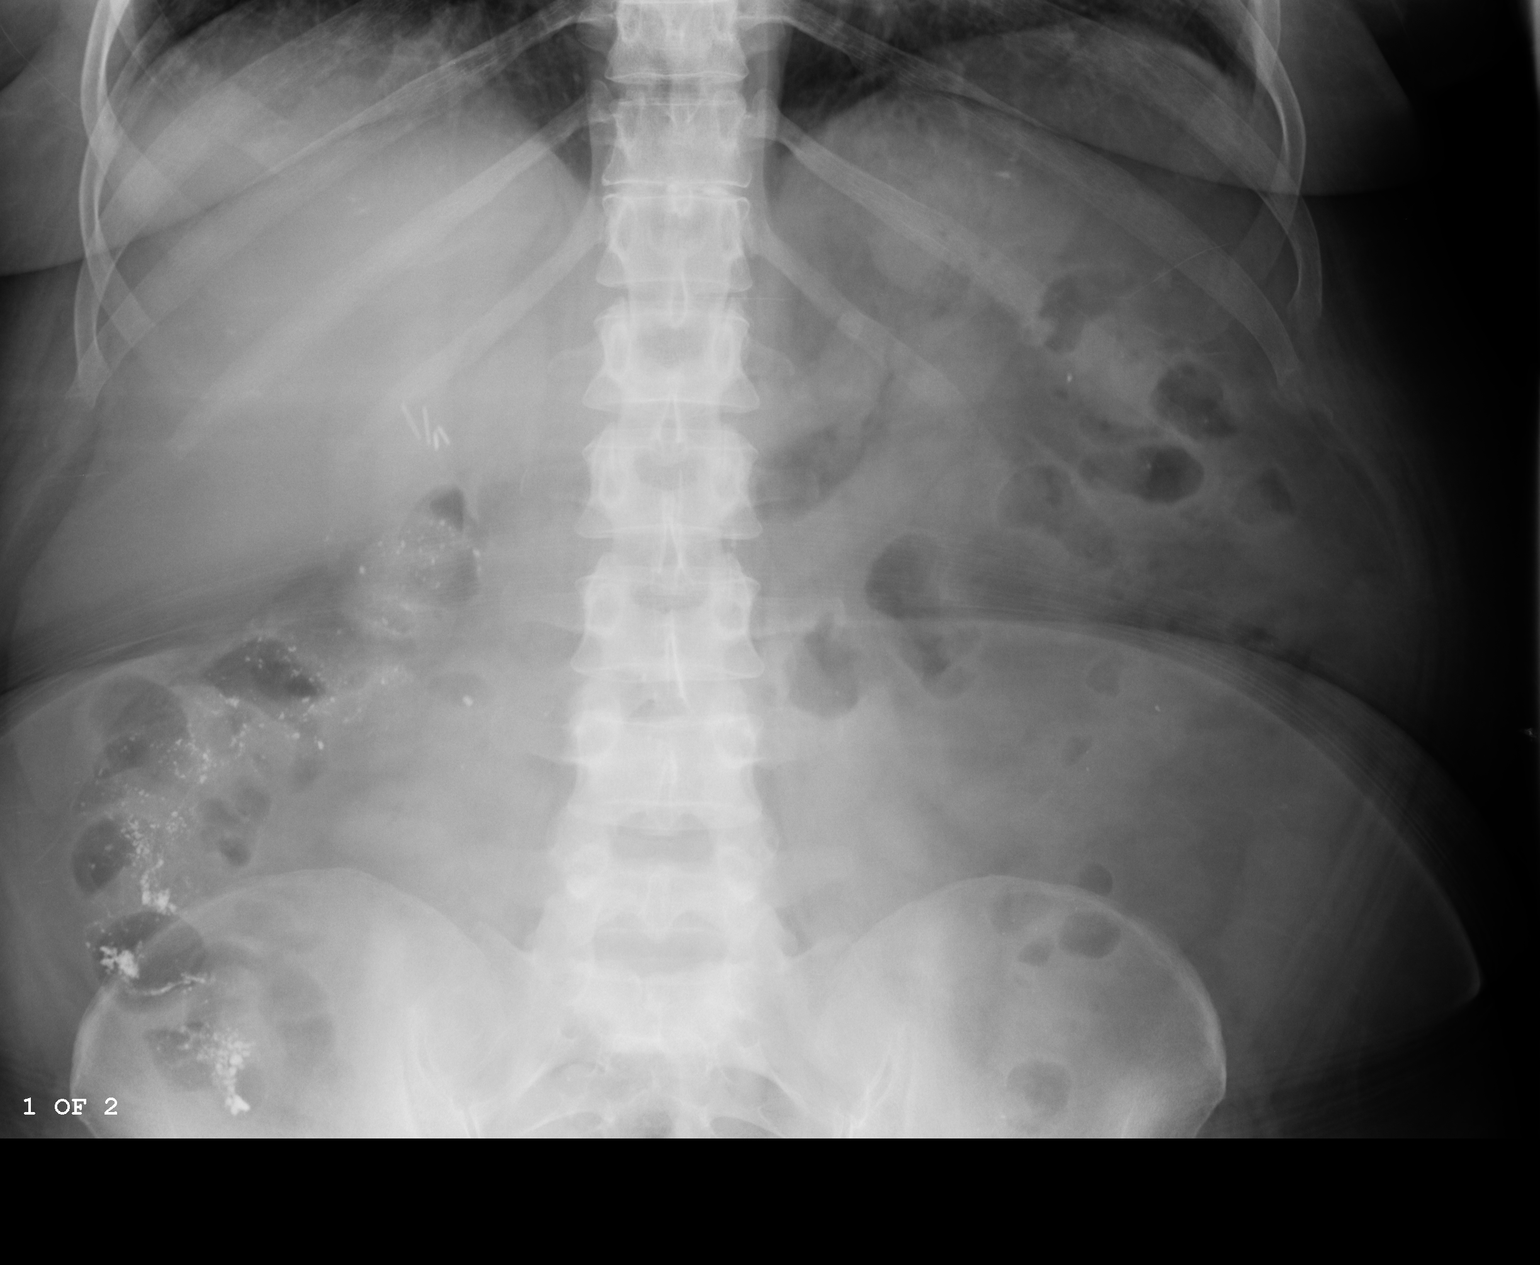
[im 2/2]
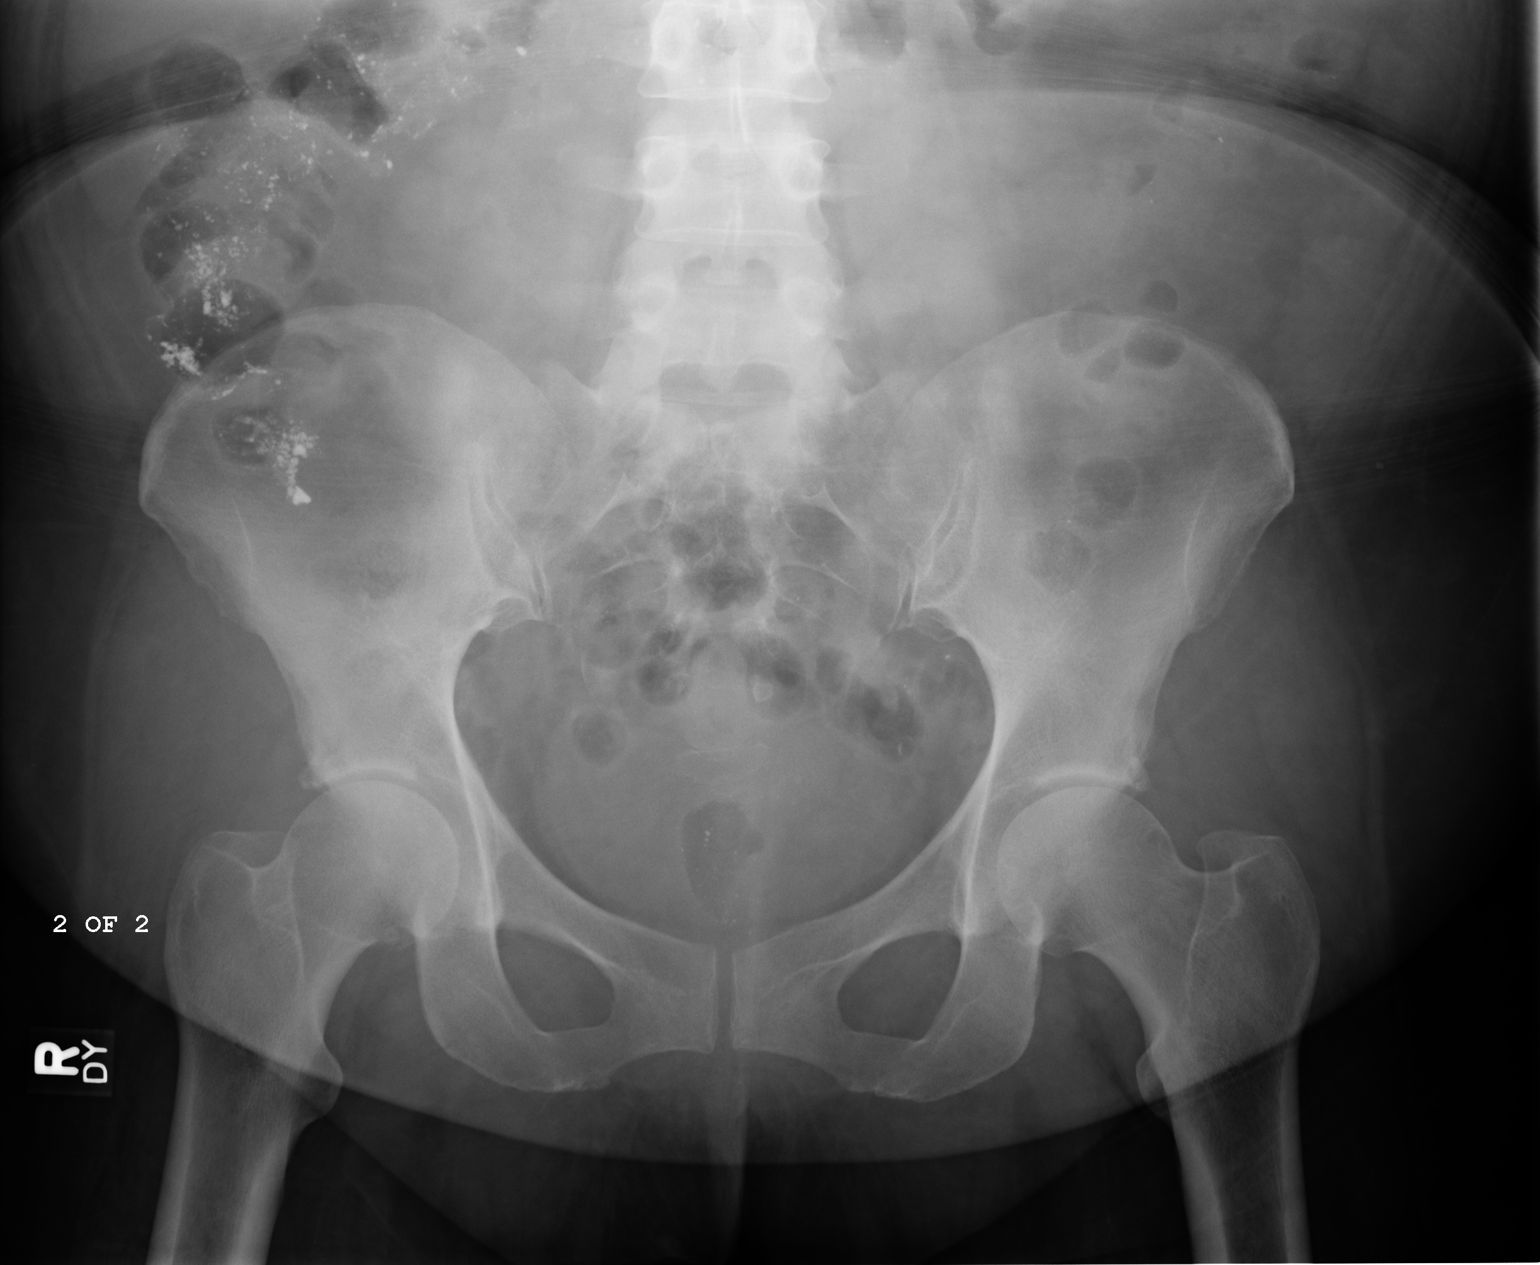

[2 of 2 positions shown; findings below may reference images not displayed]

PROCEDURE:     DXR - DXR KIDNEY URETER BLADDER  - August 27, 2009  [DATE]

RESULT:     The bowel gas pattern is within the limits of normal. A small
amount of retained barium is noted in the right colon. There is no evidence
of obstruction. The bony structures are grossly intact. There are surgical
clips in the gallbladder fossa.
IMPRESSION: I see no acute intra-abdominal abnormality. Specifically
there are no findings suspicious for ileus or obstruction.

## 2010-01-05 IMAGING — CR DG ABDOMEN 2V
1 series · 2 of 2 positions shown · non-contrast
Comparison: none

REASON FOR EXAM: nausea and vomiting
COMMENTS:

[Series 1: view not recorded · 0.17mm/px · 2 of 2 slices shown]
[im 1/2]
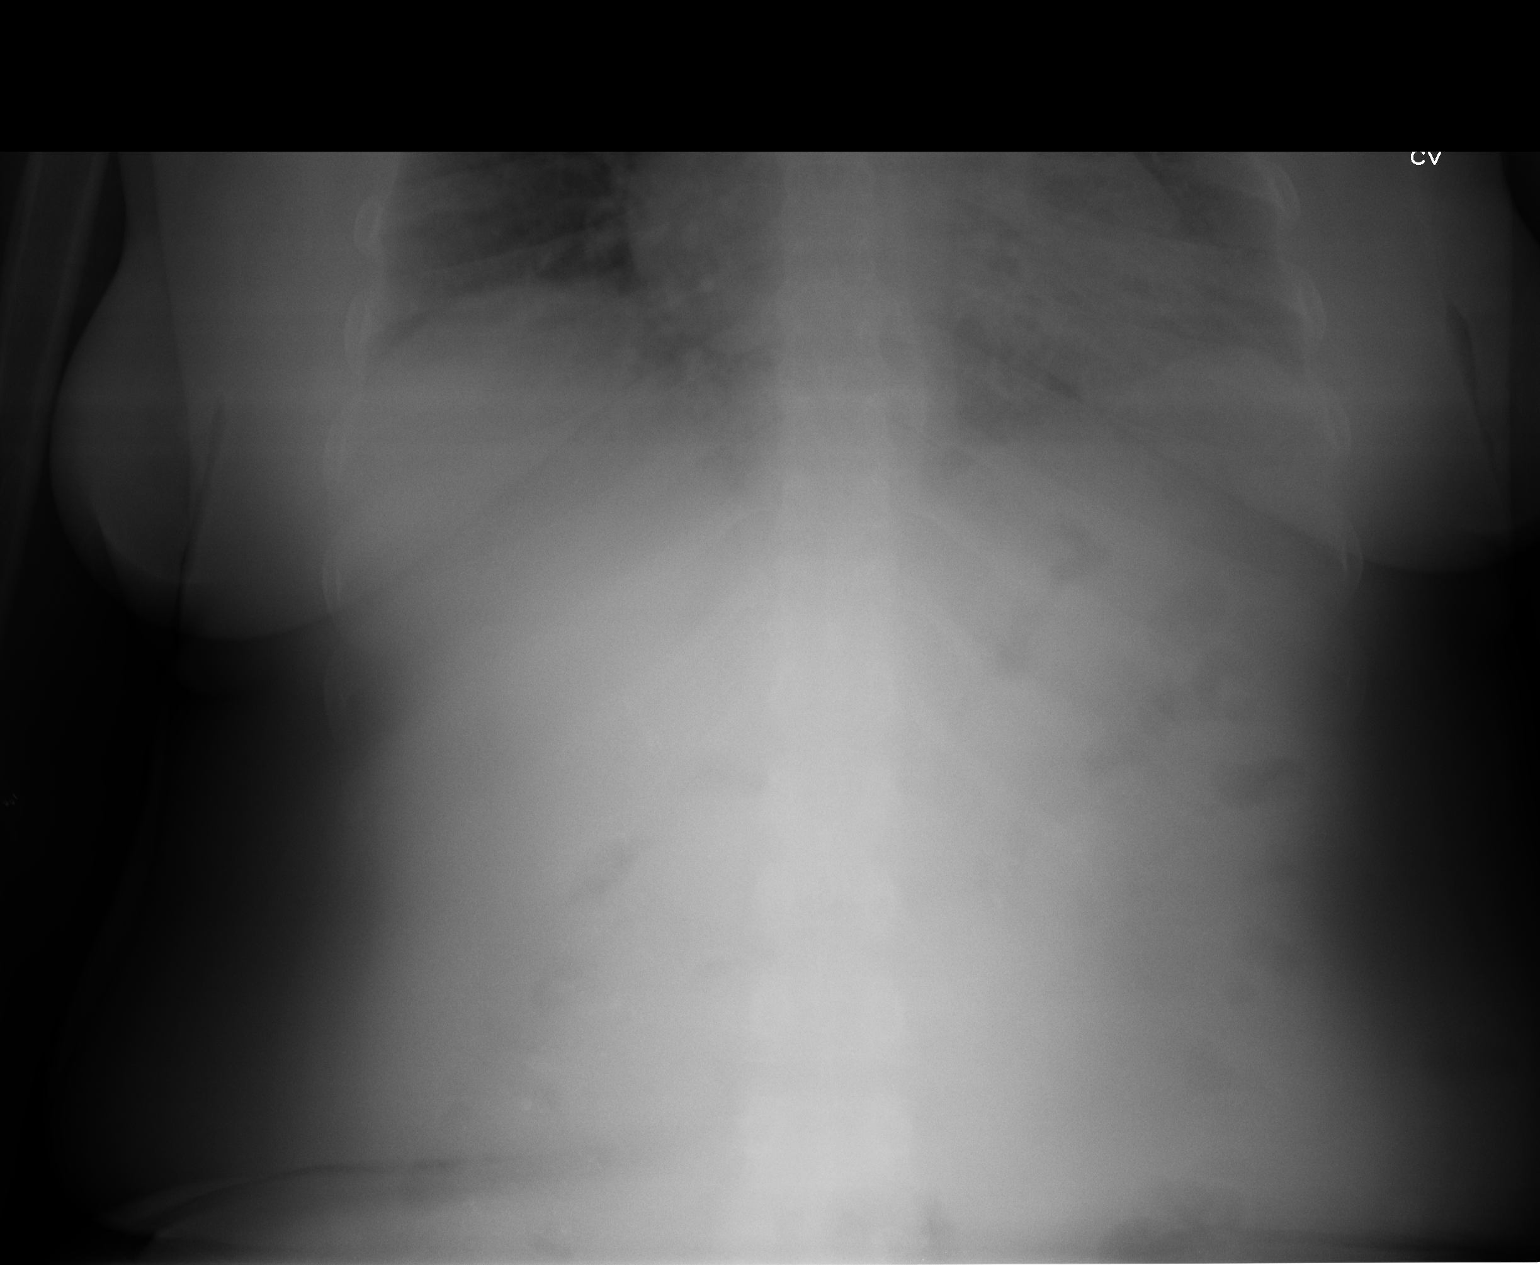
[im 2/2]
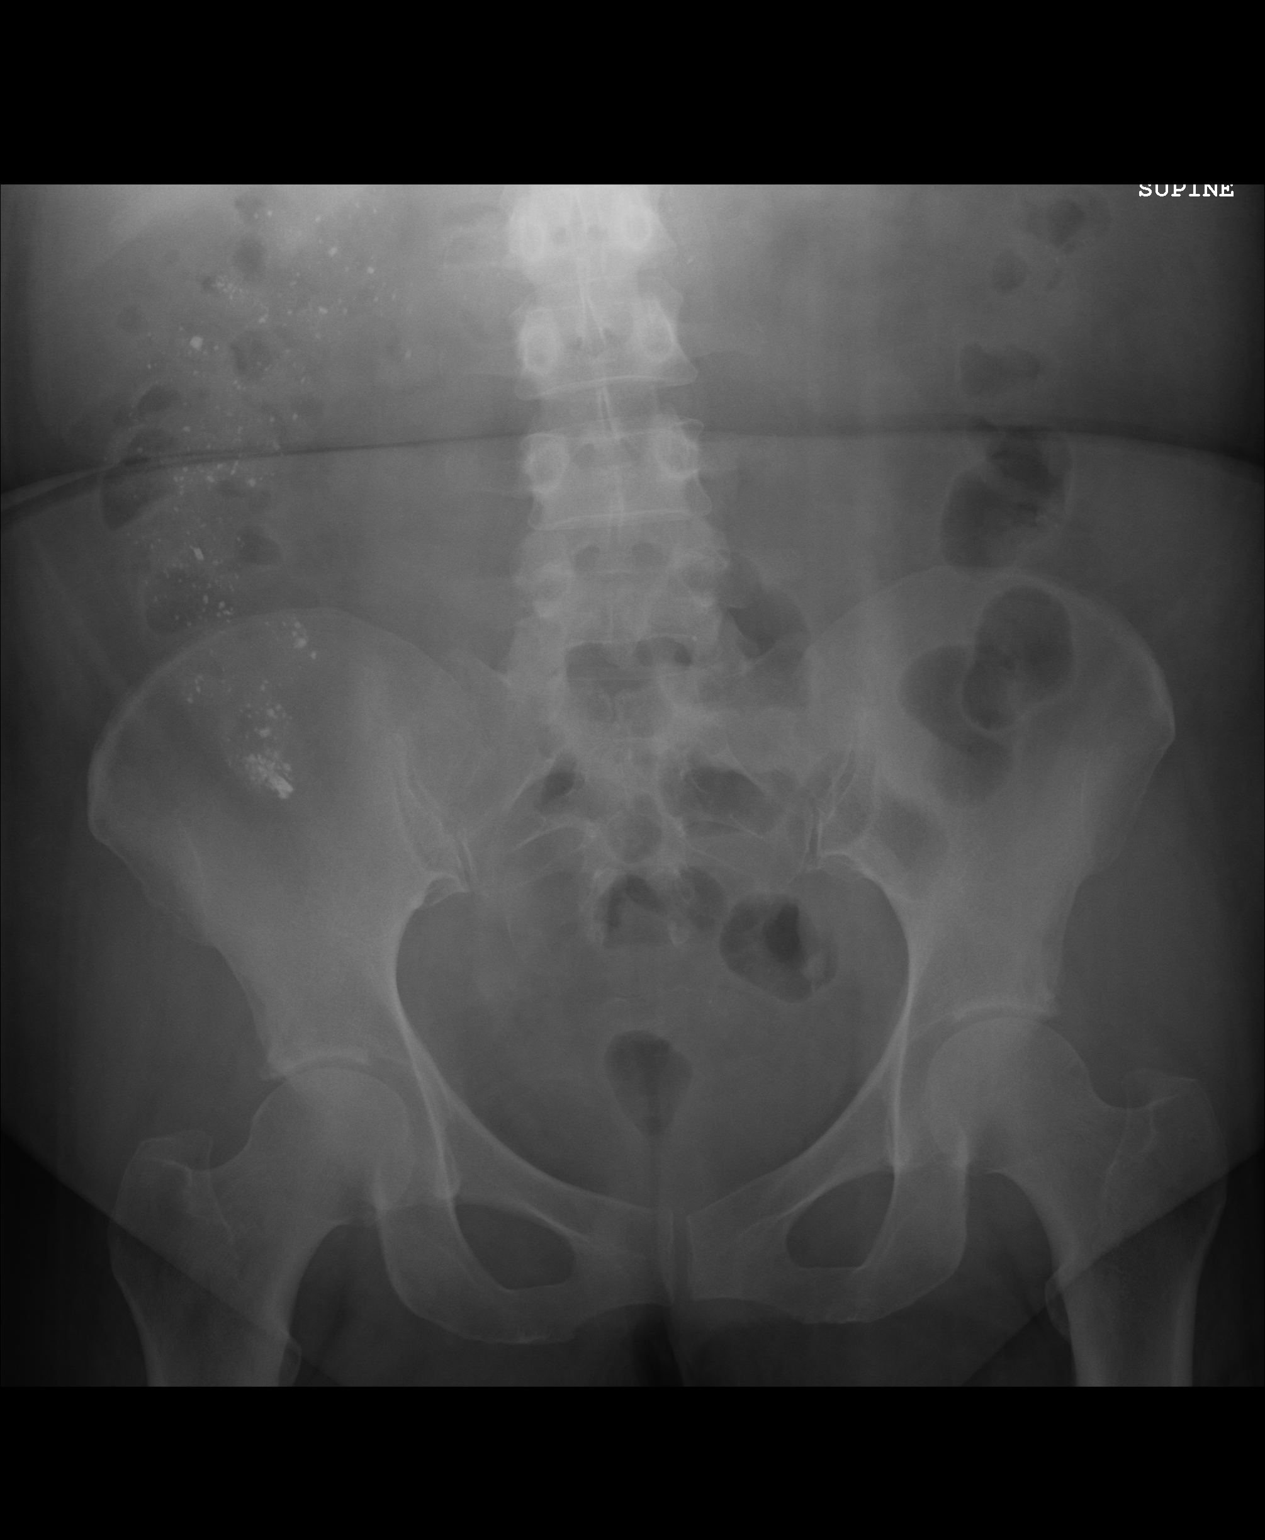

[2 of 2 positions shown; findings below may reference images not displayed]

PROCEDURE:     DXR - DXR ABDOMEN 2 V FLAT AND ERECT  - August 30, 2009 [DATE]

RESULT:     Comparison is made to a study of 08/04/2009. There is some
residual barium in the right colon. Ingested medication could give a similar
appearance in certain situations. There is no free air or abnormal bowel
distention. No definite urinary stones are seen although given the patchy
densities over the midabdominal region these could not be completely
excluded. The densities are likely within the bowel.
IMPRESSION: Please see above.

## 2010-01-06 IMAGING — US ABDOMEN ULTRASOUND
1 series · 17 of 25 positions shown · non-contrast
Comparison: none

REASON FOR EXAM: RUQ Pain
COMMENTS:

[Series 1: abdomen ultrasound · 17 of 59 slices shown]
[im 1/59]
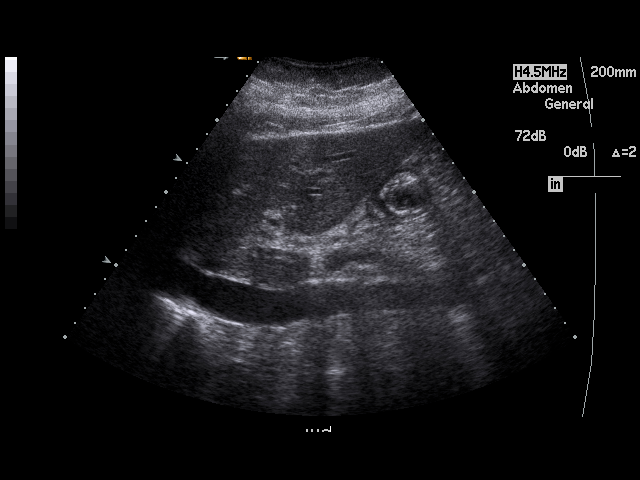
[im 5/59]
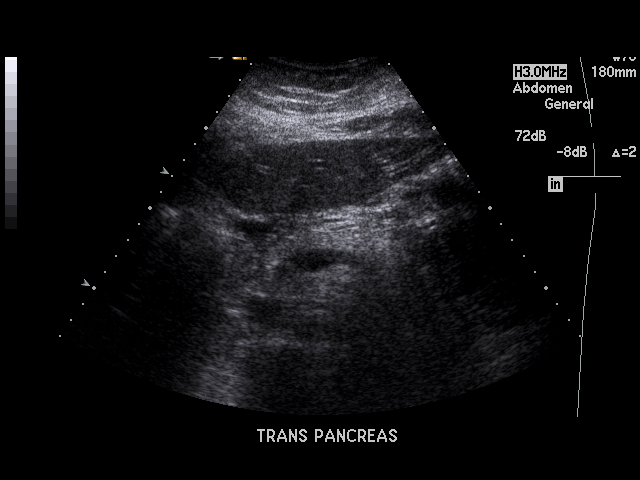
[im 8/59]
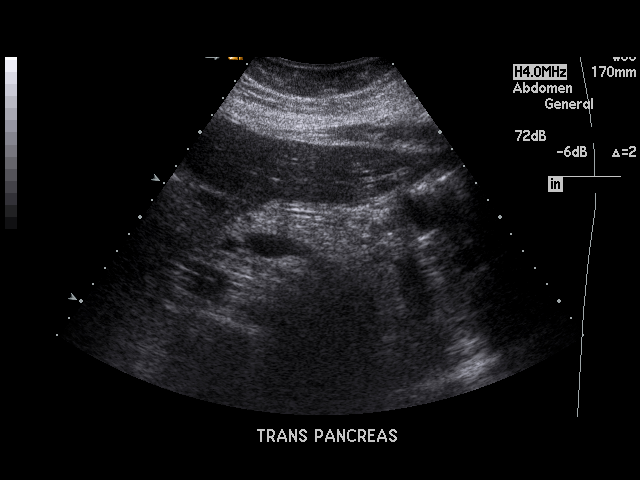
[im 13/59]
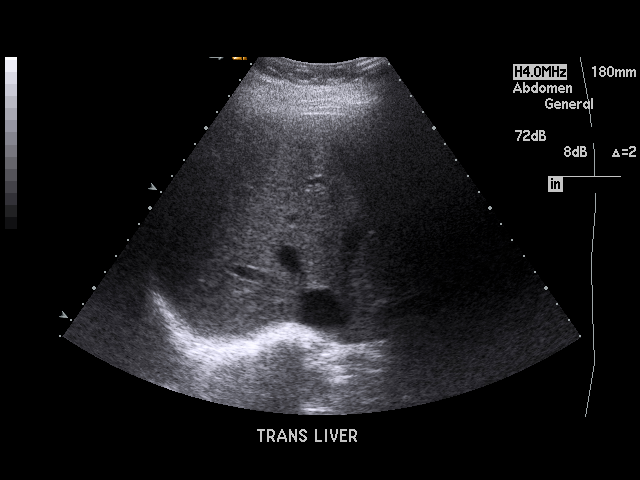
[im 15/59]
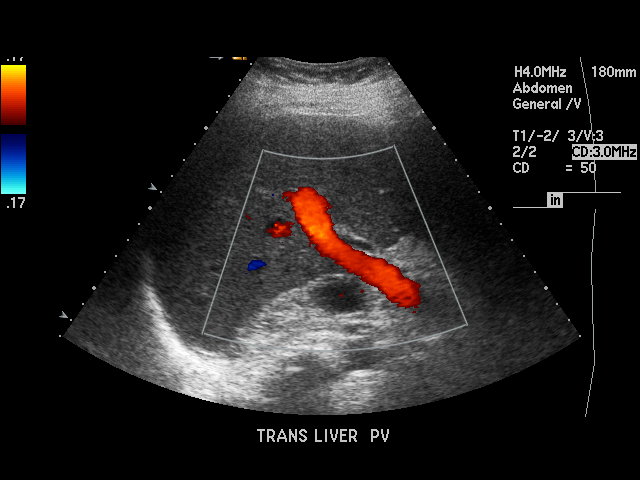
[im 20/59]
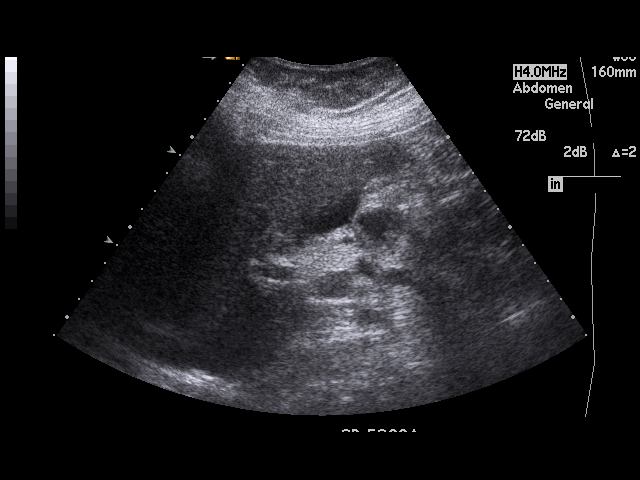
[im 22/59]
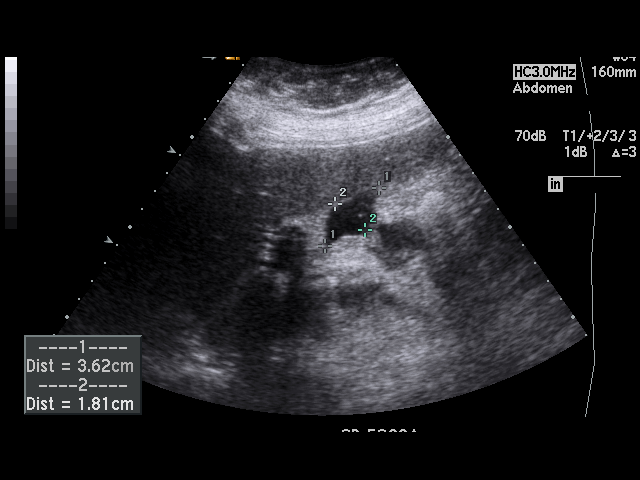
[im 27/59]
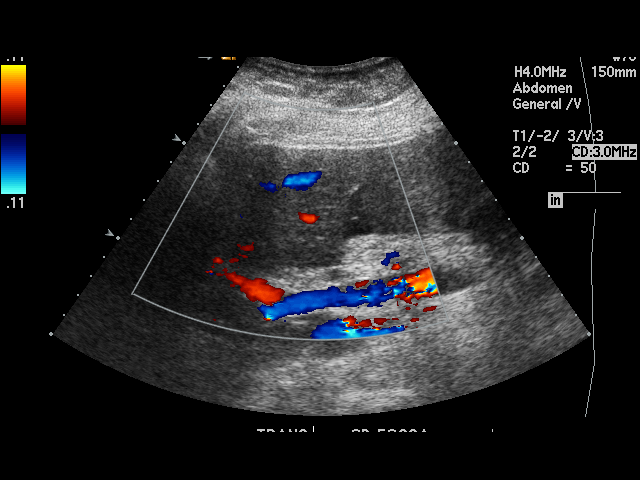
[im 30/59]
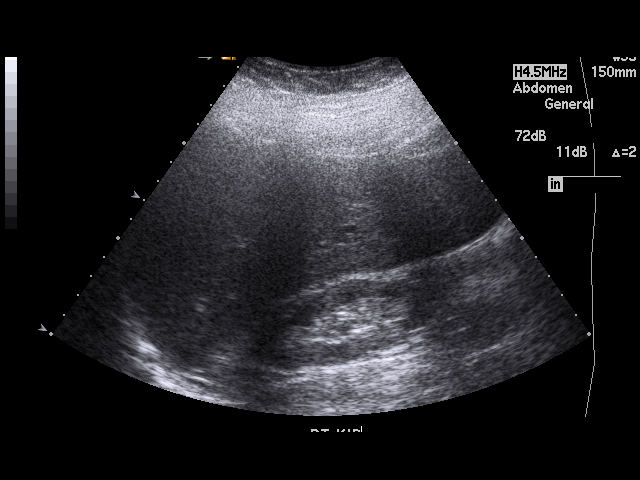
[im 32/59]
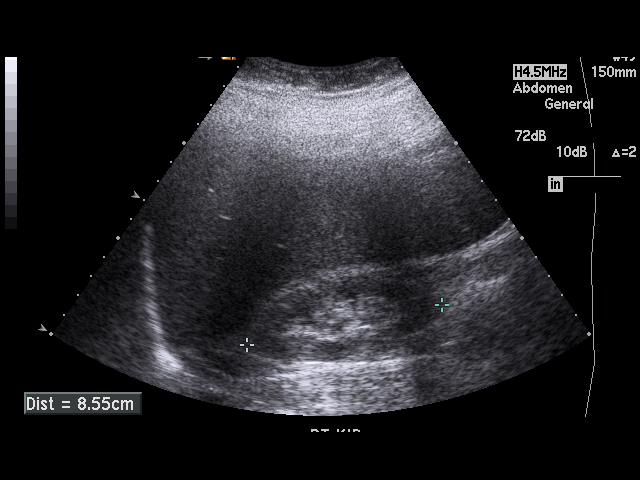
[im 37/59]
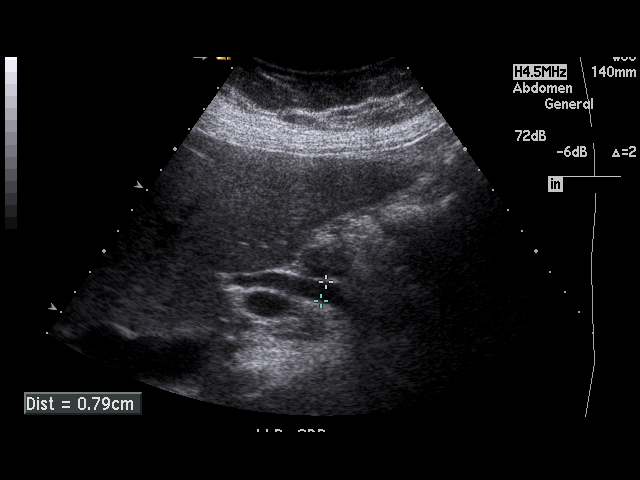
[im 39/59]
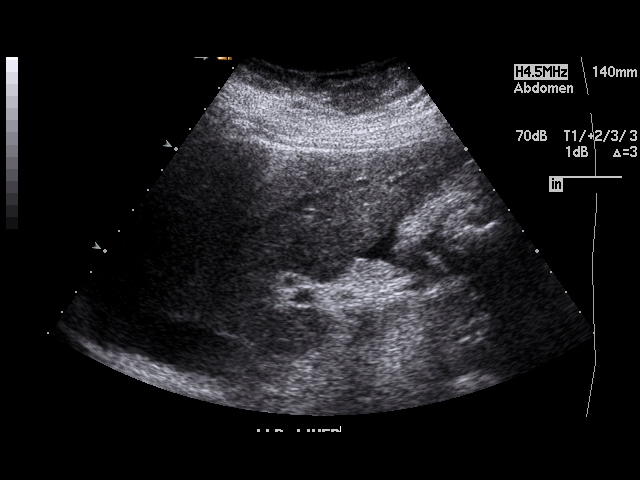
[im 44/59]
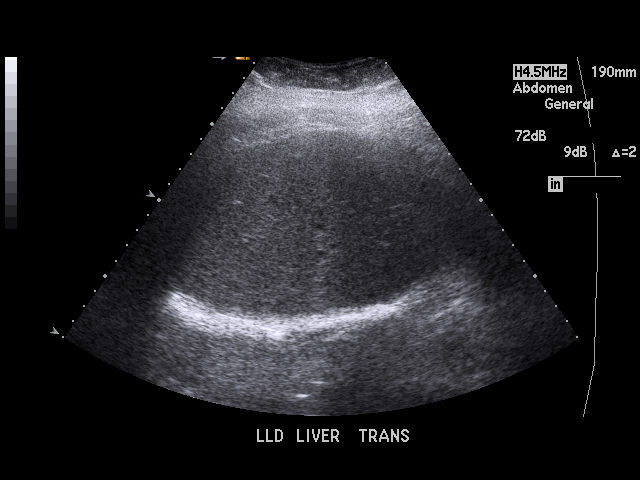
[im 46/59]
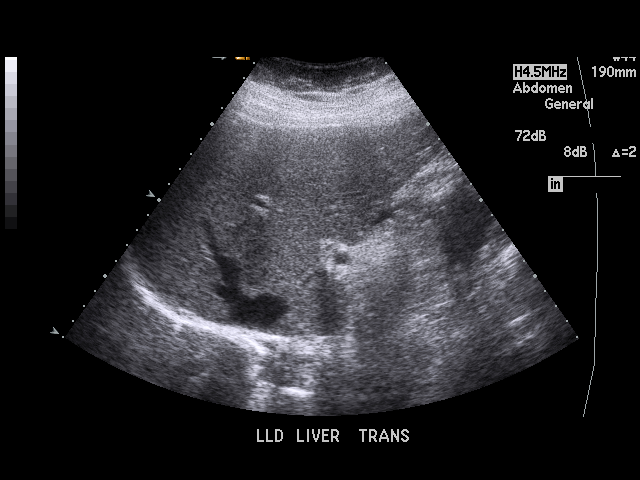
[im 51/59]
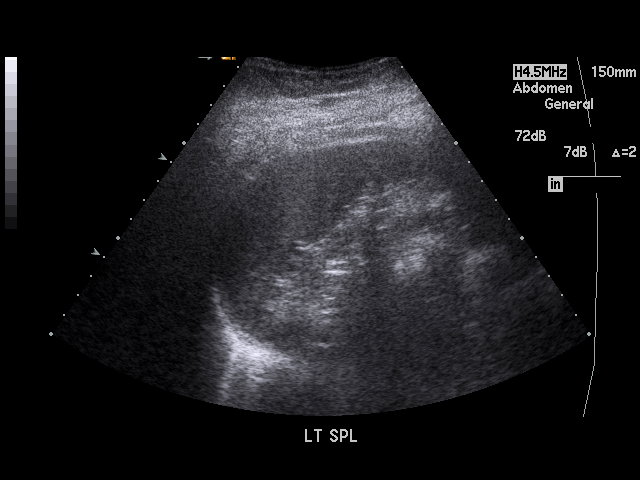
[im 54/59]
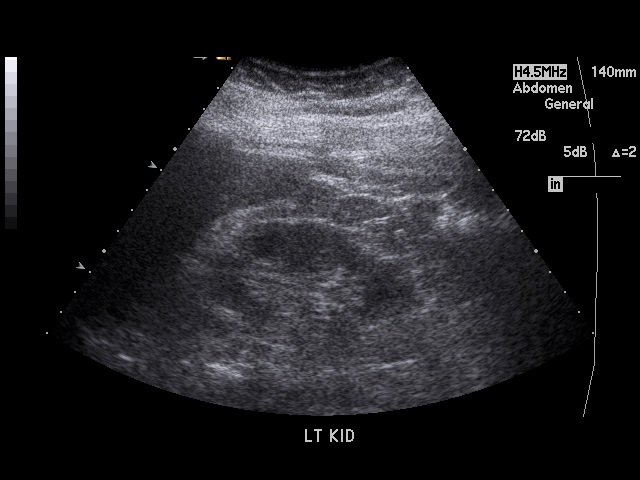
[im 59/59]
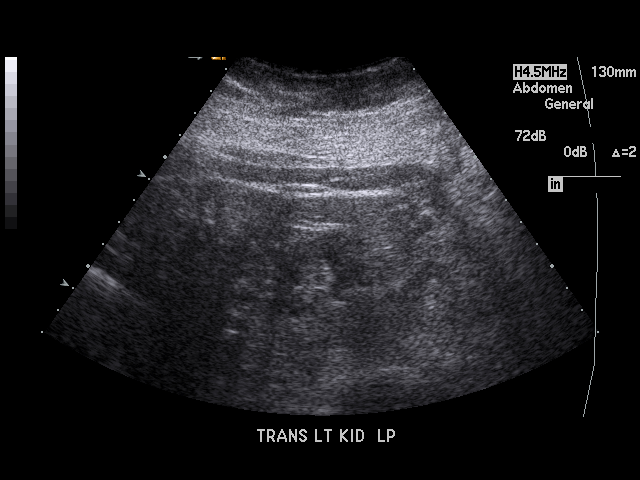

[17 of 25 positions shown; findings below may reference images not displayed]

PROCEDURE:     US  - US ABDOMEN GENERAL SURVEY  - August 31, 2009  [DATE]

RESULT:     The liver demonstrates a homogeneous echotexture. Hepatopetal
flow is demonstrated within the portal vein. The aorta and IVC are
unremarkable. The pancreas and spleen are unremarkable. The spleen measures
10.5 cm in longitudinal dimensions. Evaluation of the gallbladder fossa
demonstrates a focal region within the gallbladder fossa measuring 3.62 x
0.82 x 1.1 cm. The patient has had a prior cholecystectomy. This finding may
represent the sequela of a resolving hematoma or postop seroma. A biliary
leak, if clinically warranted, cannot be completely excluded and
surveillance evaluation can be obtained or further evaluation with Nuclear
Medicine imaging. Evaluation of the kidneys demonstrates no evidence of
hydronephrosis, masses or calculi. The common bile duct measures 7.9 mm in
diameter.
IMPRESSION: 1.     Fluid collection within the gallbladder fossa with differential
consideration as described above. If clinically warranted, this finding can
be monitored with ultrasound or possibly further evaluated with nuclear
medicine hepatobiliary imaging if biliary leak is of concern.

## 2010-01-09 IMAGING — NM NUCLEAR MEDICINE HEPATOHBILIARY INCLUDE GB
1 series · 8 of 8 positions shown · non-contrast
Comparison: none

REASON FOR EXAM: R/O leak
COMMENTS:

[Series 1000: gallbladder statics · 4.80mm/px · 8 of 8 slices shown]
[im 1/8]
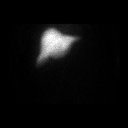
[im 2/8]
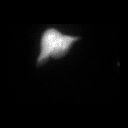
[im 3/8]
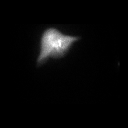
[im 4/8]
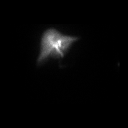
[im 5/8]
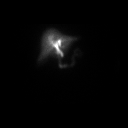
[im 6/8]
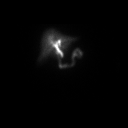
[im 7/8]
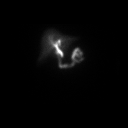
[im 8/8]
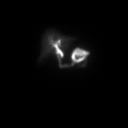

[8 of 8 positions shown; findings below may reference images not displayed]

PROCEDURE:     NM  - NM HEPATOBILIARY IMAGE  - September 03, 2009  [DATE]

RESULT:     Following intravenous administration of 8.4 mCi technetium 99m
Choletec, there is observed prompt visualization of tracer activity in the
liver at 5 minutes. At 30 minutes, there is observed tracer activity in the
common duct and proximal small bowel. There is also noted tracer activity in
the region of the gallbladder bed. The tracer activity at this site remains
confined to the gallbladder bed throughout the remainder of the study up to
60 minutes. Since the patient's cholecystectomy was reportedly 10-12 years
ago, a bile leak is considered unlikely and a diverticulum of the duodenum
or biliary ducts is more likely. Nonetheless, in view of the patient's
symptomatology and since a fluid collection was noted in this region at
prior ultrasound, it is suggested that the patient have repeat CT to
evaluate for possible change in the region of the gallbladder bed. Note is
made that no fluid collection was present in this region on the exam of
07/18/2009.
IMPRESSION: 1. The common bile duct is patent and there is observed normal appearance
and clearance of tracer activity from the liver.
2. Tracer activity is seen in the region of the gallbladder bed as described
above and which is considered unlikely to represent a leak since the
patient's cholecystectomy was in the remote past. Since no fluid collection
was observed in this region on a recent prior CT, comparison with a repeat
CT is suggested if clinically indicated.

## 2010-01-26 ENCOUNTER — Inpatient Hospital Stay: Payer: Self-pay | Admitting: Internal Medicine

## 2010-01-27 ENCOUNTER — Inpatient Hospital Stay: Payer: Self-pay | Admitting: Internal Medicine

## 2010-02-03 IMAGING — CT CT ABD-PELV W/O CM
1 of 2 series · 15 of 32 positions shown, 19 images · non-contrast
Comparison: none

REASON FOR EXAM: (1) ruq pain; (2) pain
COMMENTS:

[Series 2: soft tissue · axial · 0.73mm/px · z∈[+18,+424]mm · 15 of 149 slices shown, 19 images]
[im 7/149  soft-tissue]
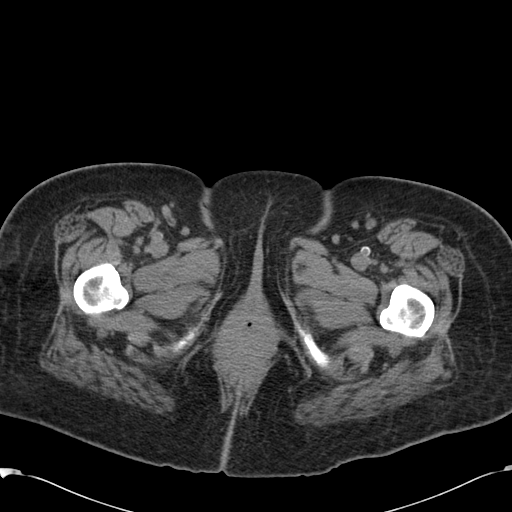
[im 7/149  bone]
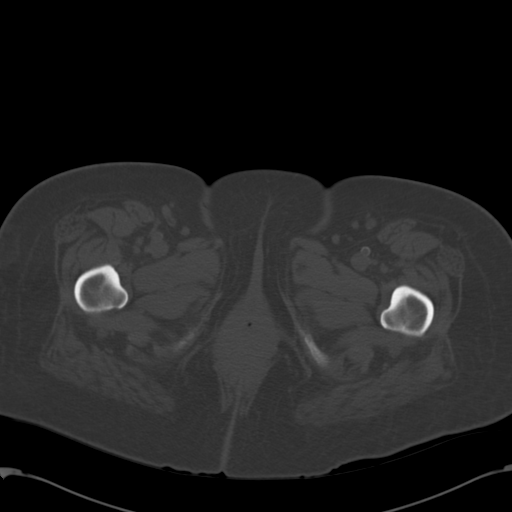
[im 20/149  soft-tissue]
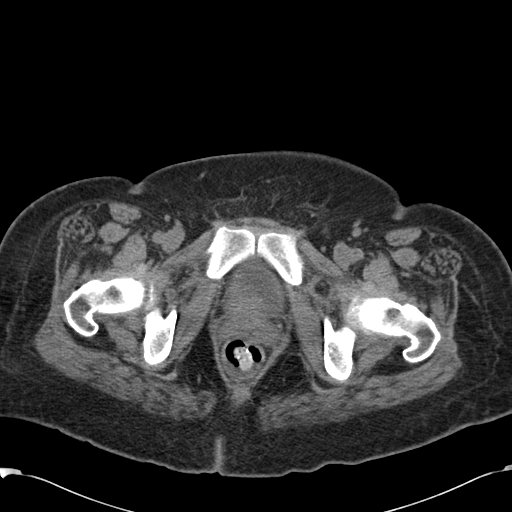
[im 33/149  soft-tissue]
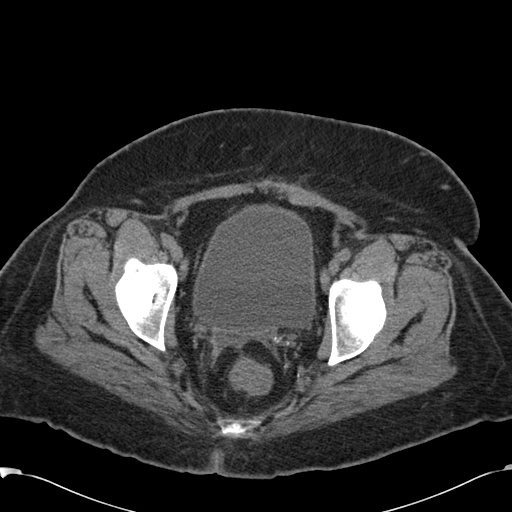
[im 39/149  soft-tissue]
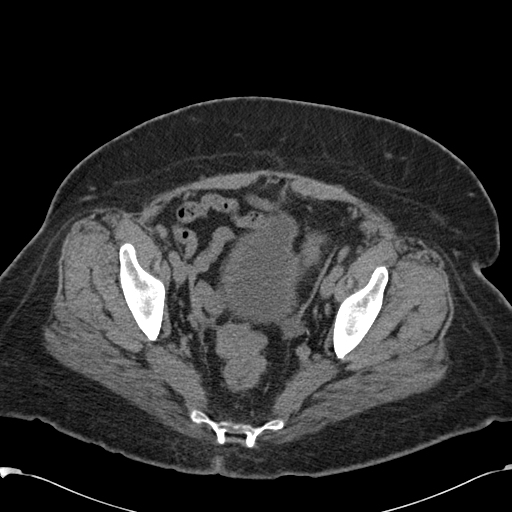
[im 52/149  soft-tissue]
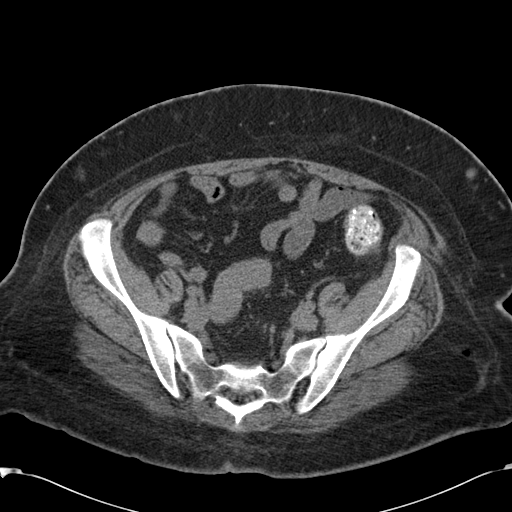
[im 65/149  soft-tissue]
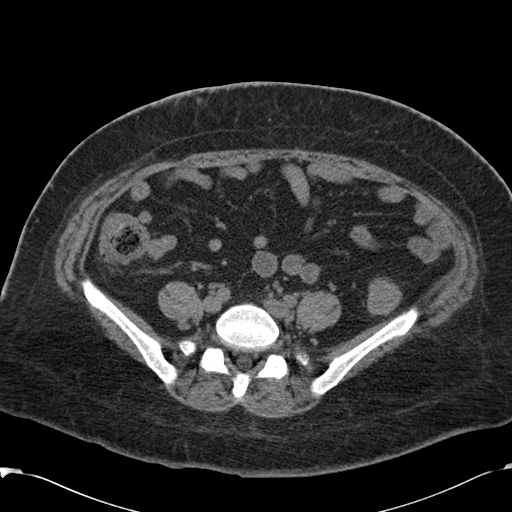
[im 78/149  soft-tissue]
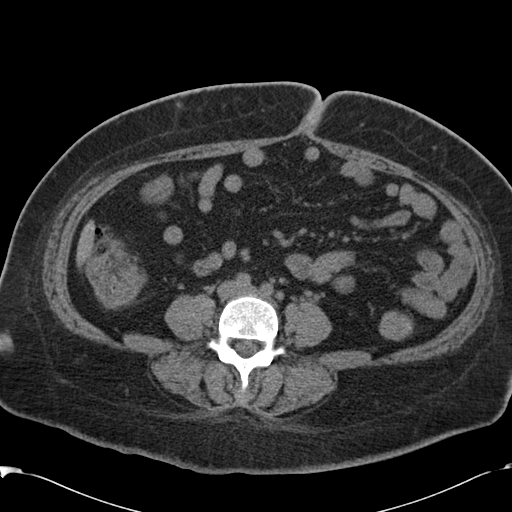
[im 84/149  soft-tissue]
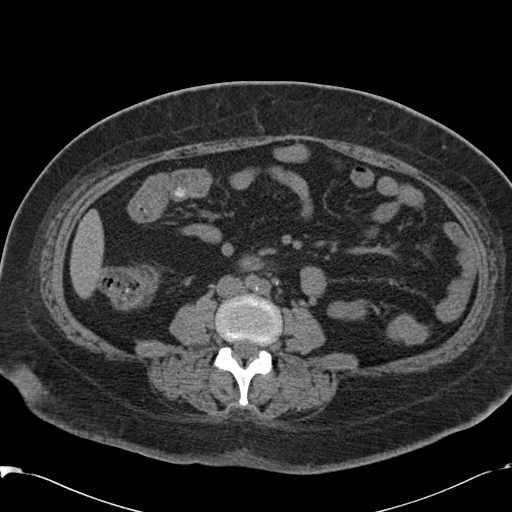
[im 97/149  soft-tissue]
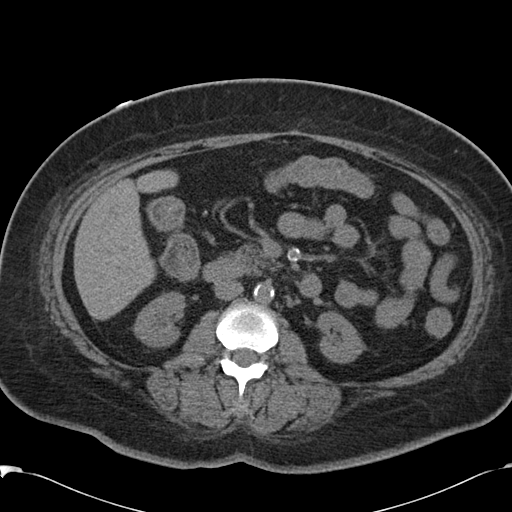
[im 97/149  bone]
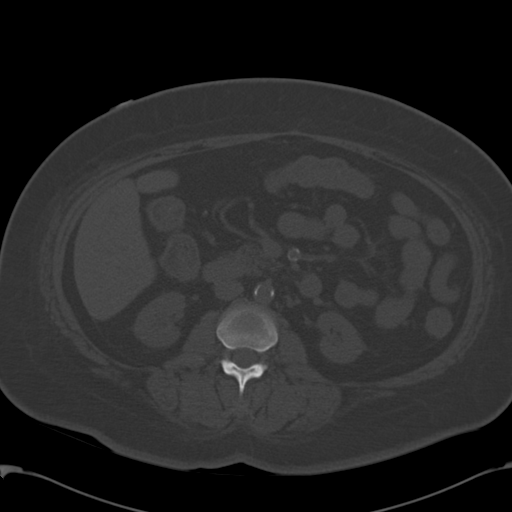
[im 110/149  soft-tissue]
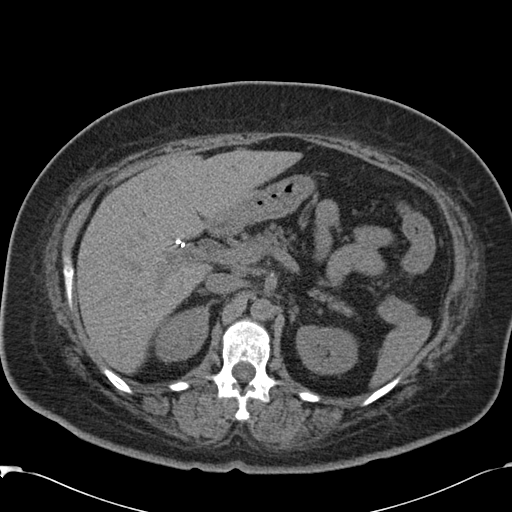
[im 116/149  soft-tissue]
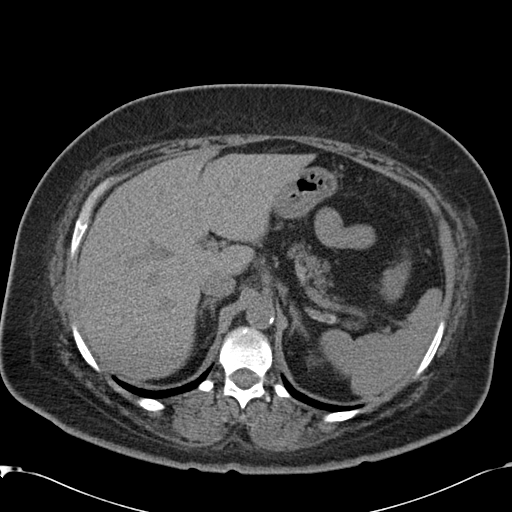
[im 123/149  lung]
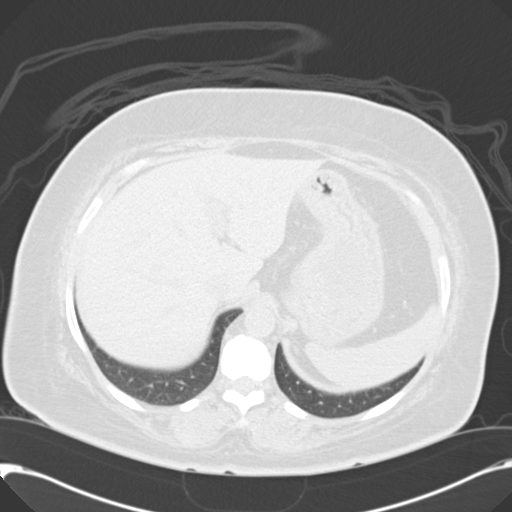
[im 129/149  soft-tissue]
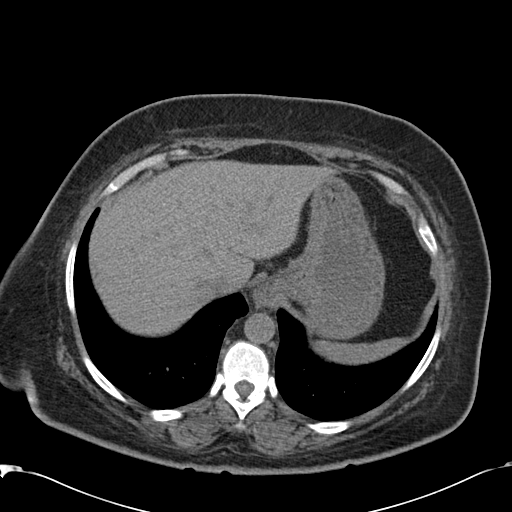
[im 129/149  lung]
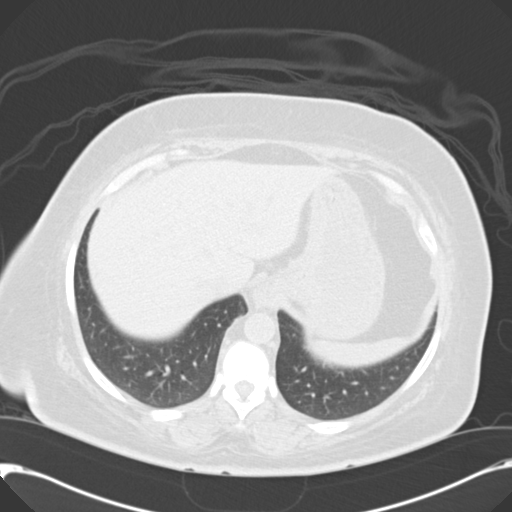
[im 136/149  lung]
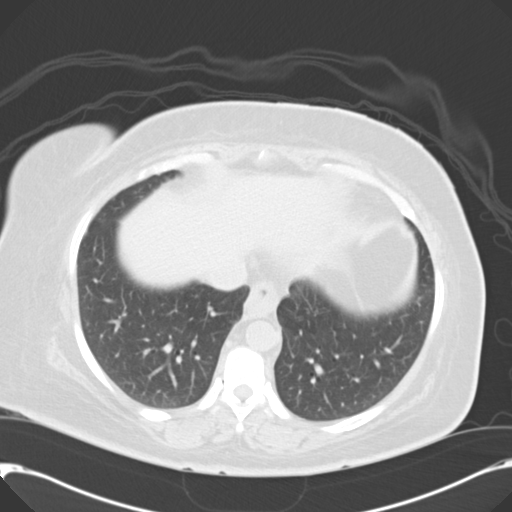
[im 142/149  soft-tissue]
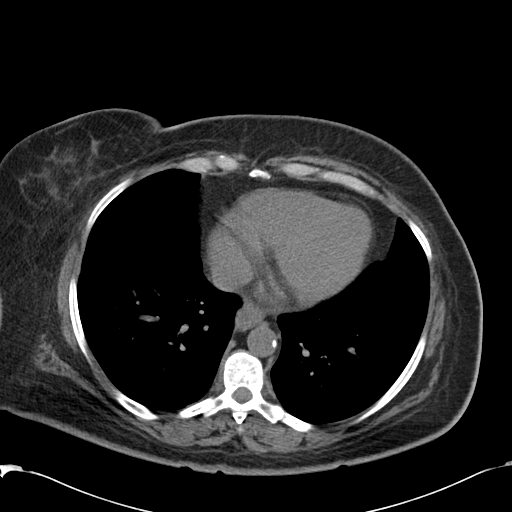
[im 142/149  lung]
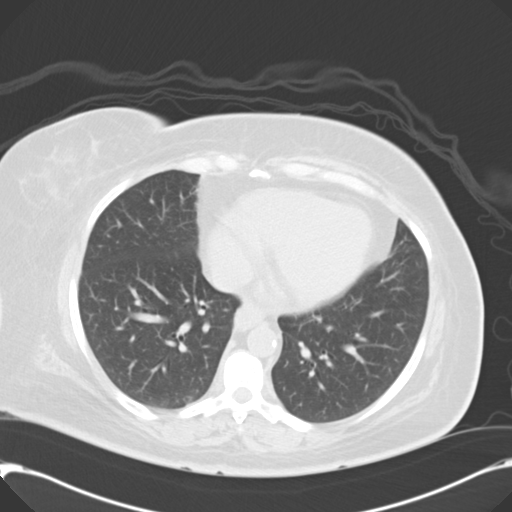

[15 of 32 positions shown; findings below may reference images not displayed]

PROCEDURE:     CT  - CT ABDOMEN AND PELVIS W[DATE]  [DATE]

RESULT:     Noncontrast emergent renal stone protocol CT of the abdomen and
pelvis is reconstructed at 3 mm slice thickness in the axial plane and
compared to images from 06/02/2009.

Images demonstrate no hydronephrosis or hydroureter. No renal calculi are
evident. The aorta is normal in caliber with atherosclerotic calcification
present. There is a moderate amount of urine in the urinary bladder. Pelvic
phleboliths are present. There is a small hiatal hernia. The lungs appear
clear. Cholecystectomy clips are present. There is no abscess, ascites or
pneumoperitoneum. The abdominal wall appears intact. There is no abnormal
bowel distention. The pancreas shows no inflammation. No adenopathy is
evident.
IMPRESSION: 1. Unremarkable CT of the abdomen and pelvis. There is a small hiatal hernia.
2. No nephrolithiasis or hydroureteronephrosis.
3. Atherosclerotic calcification.
4. Status post cholecystectomy.

## 2010-02-10 IMAGING — CR DG ABDOMEN 3V
1 series · 4 of 4 positions shown · non-contrast
Comparison: none

REASON FOR EXAM: vomiting and gastroparesis
COMMENTS:   May transport without cardiac monitor

[Series 1: view not recorded · 0.17mm/px · 4 of 4 slices shown]
[im 1/4]
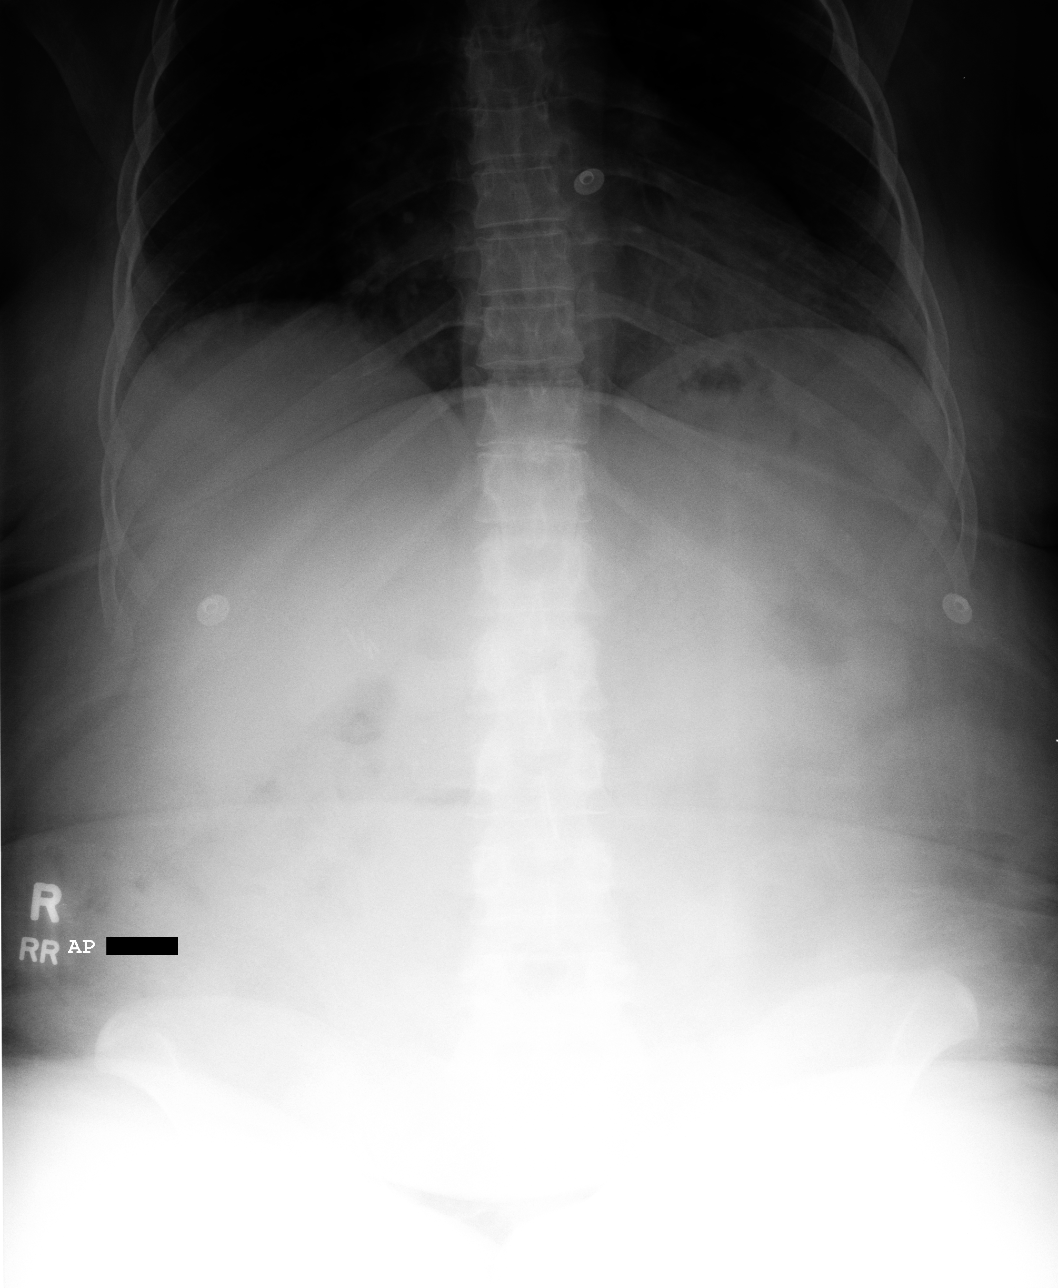
[im 2/4]
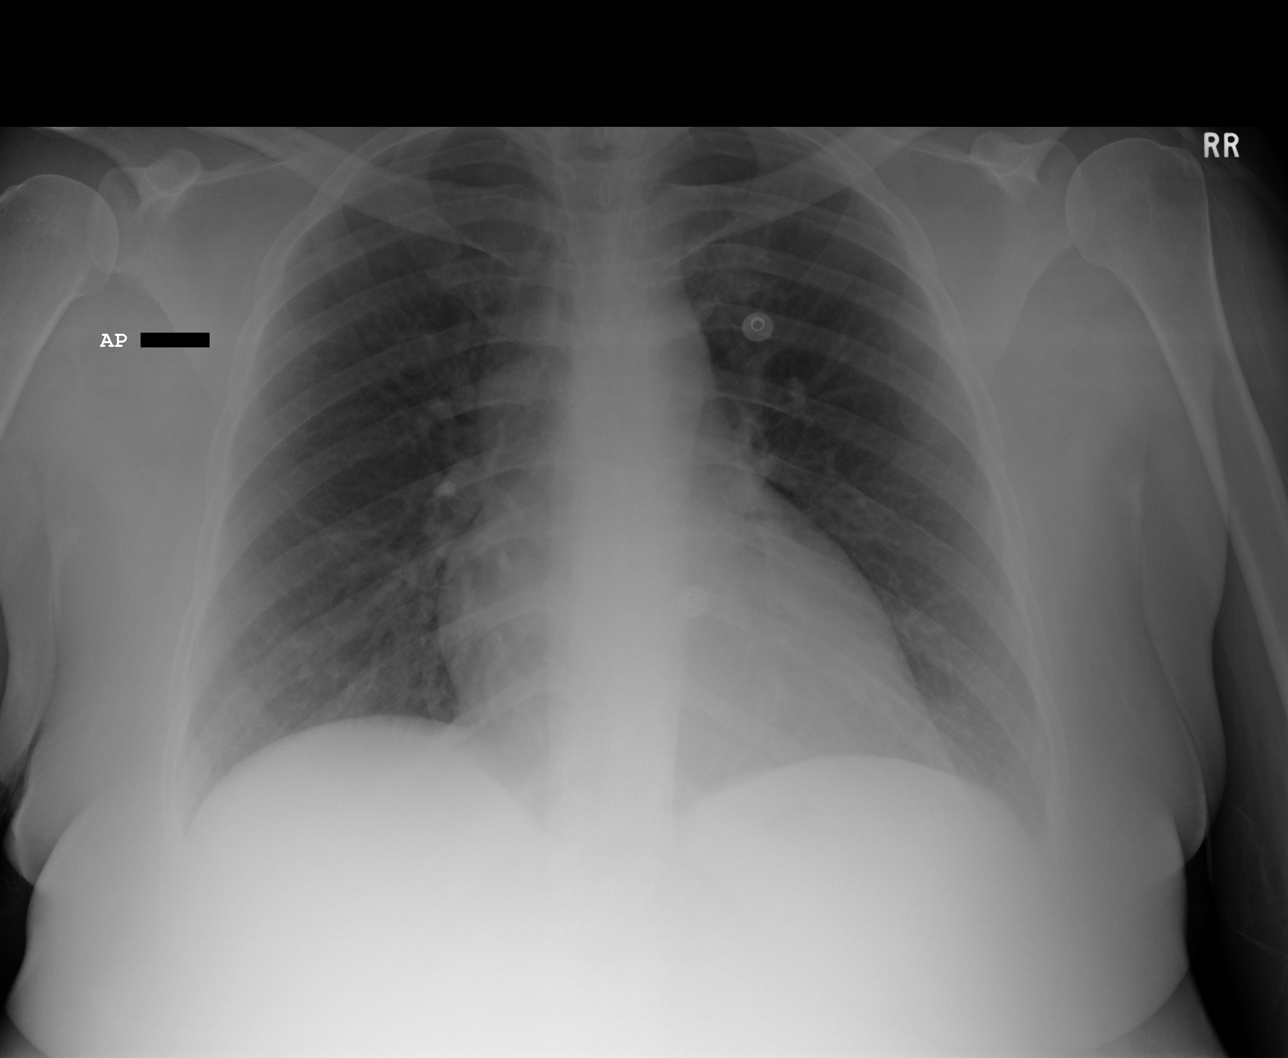
[im 3/4]
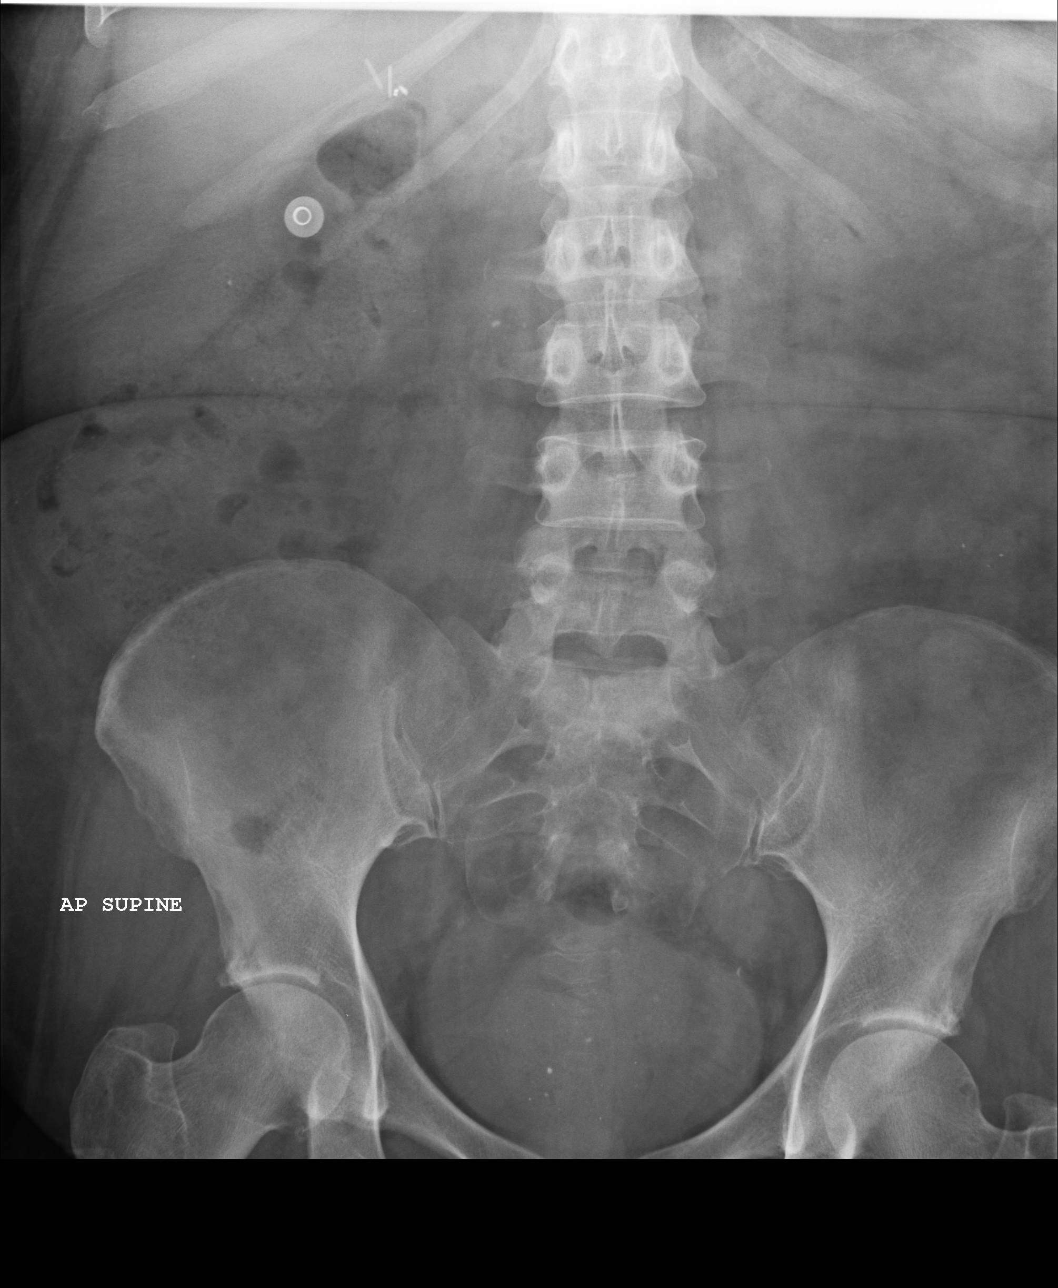
[im 4/4]
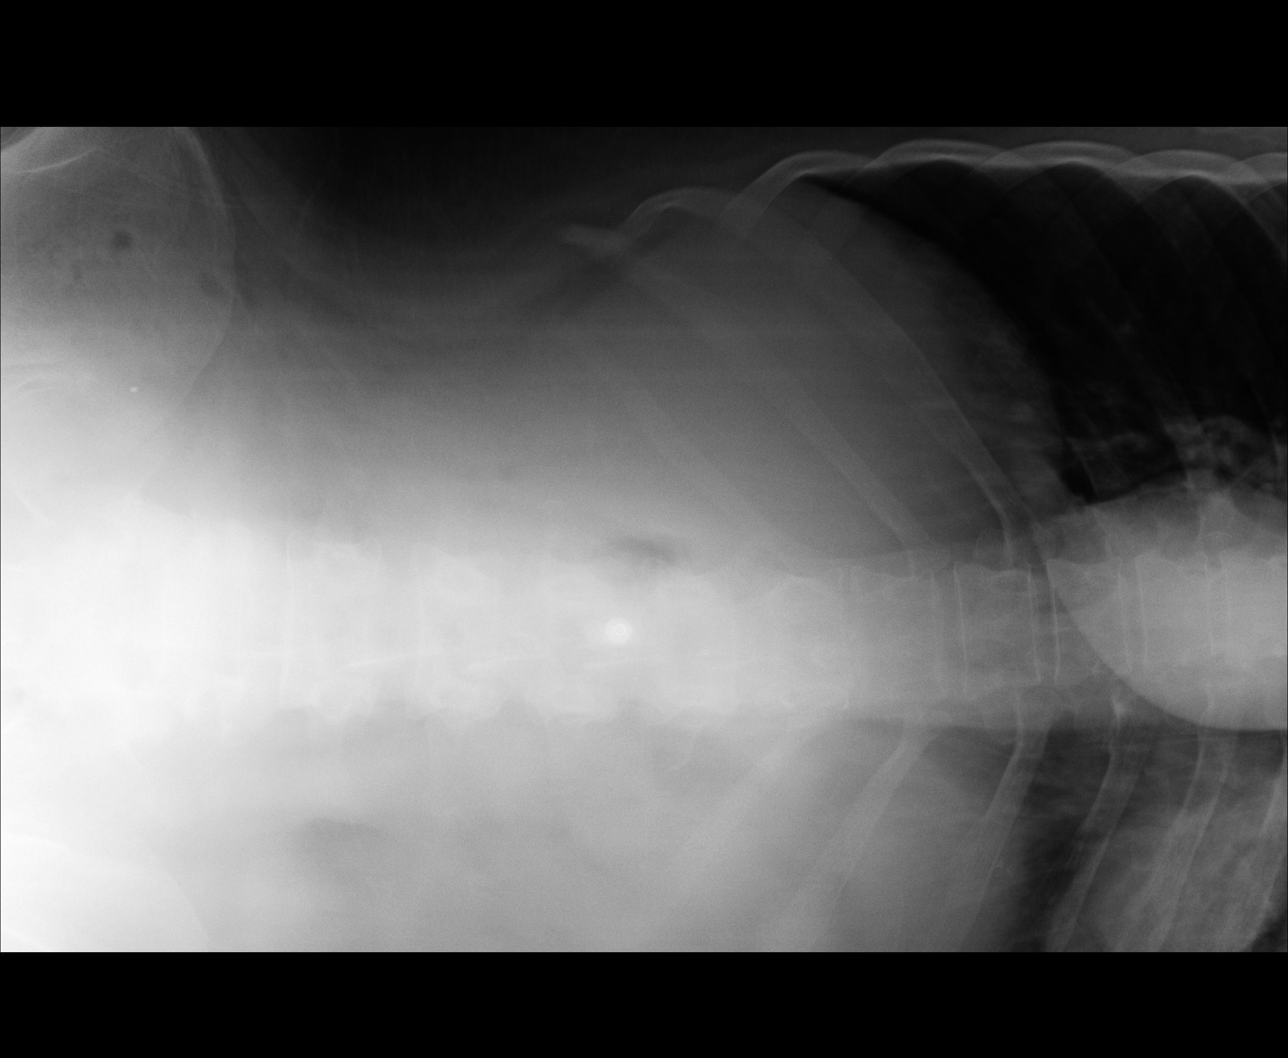

[4 of 4 positions shown; findings below may reference images not displayed]

PROCEDURE:     DXR - DXR ABDOMEN 3-WAY (INCL PA CXR)  - October 05, 2009  [DATE]

RESULT:     Comparison is made to a study 30 August, 2009.

The lungs are well-expanded and clear. The cardiac silhouette is top normal
in size. The pulmonary vascularity is not engorged. I do not see free
extraluminal gas collections under the hemidiaphragm or elsewhere within the
abdomen. The stool and gas pattern does not suggest obstruction or ileus.
There are surgical clips in the gallbladder fossa. The bony structures
appear normal.
IMPRESSION: I do not see evidence of gastroenteritis or obstruction or
ileus. There are no findings suspicious for perforation. I do not see
evidence of acute cardiopulmonary abnormality.

## 2010-02-16 IMAGING — US ABDOMEN ULTRASOUND
1 series · 17 of 25 positions shown · non-contrast
Comparison: none

REASON FOR EXAM: Abdominal pain.
COMMENTS:

[Series 1: abdomen ultrasound · 17 of 51 slices shown]
[im 1/51]
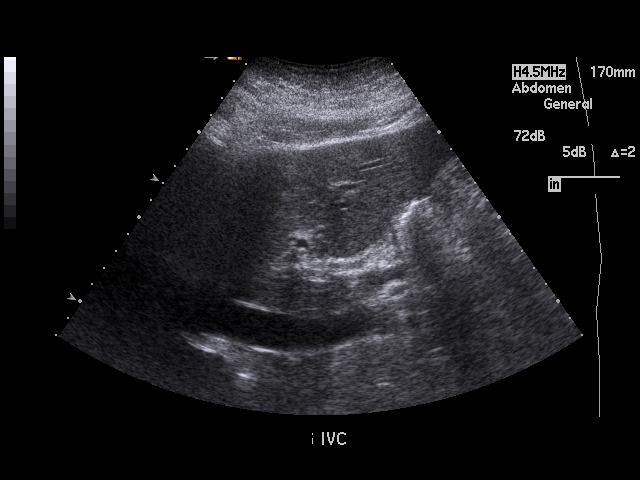
[im 5/51]
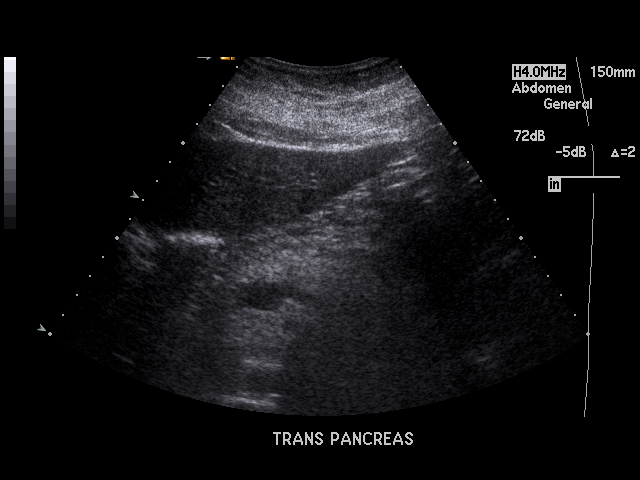
[im 7/51]
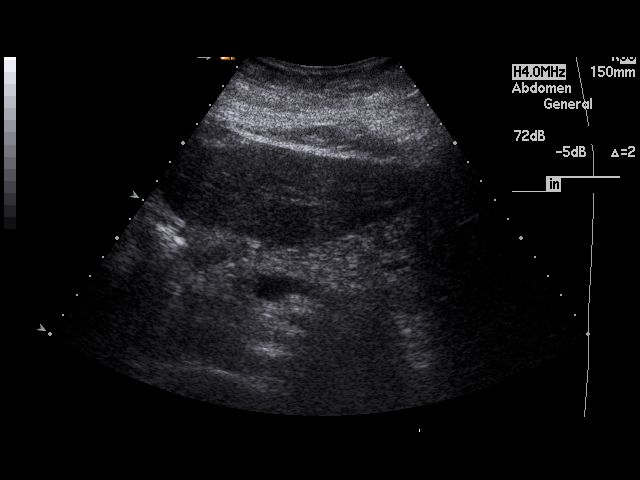
[im 11/51]
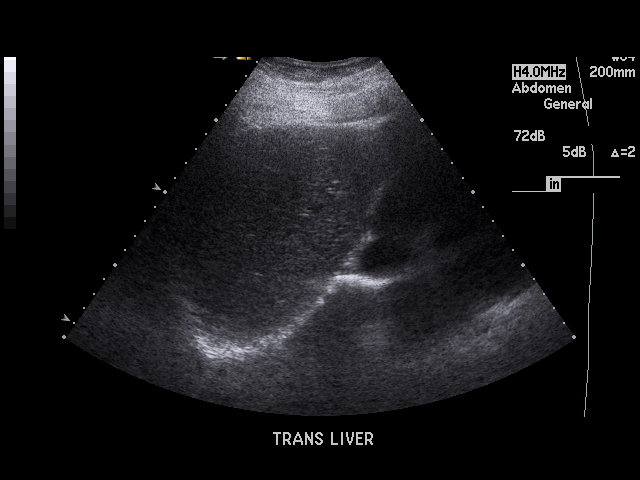
[im 13/51]
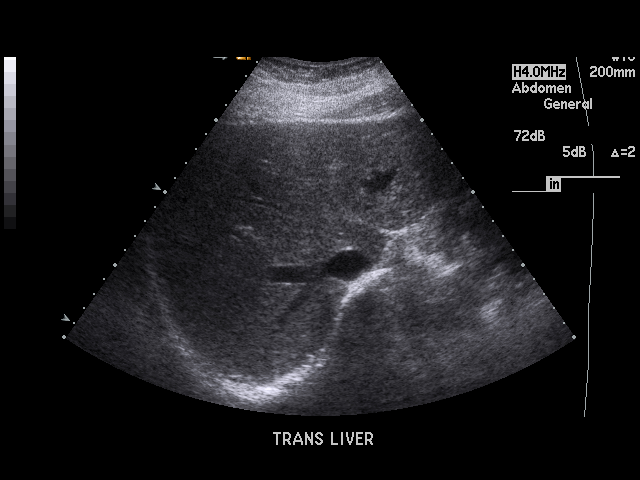
[im 17/51]
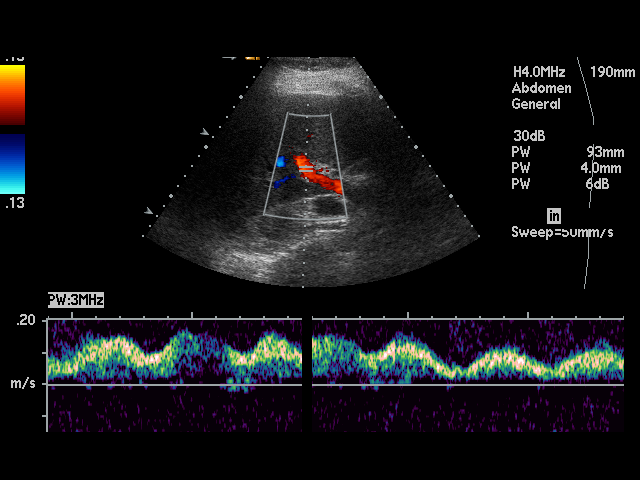
[im 19/51]
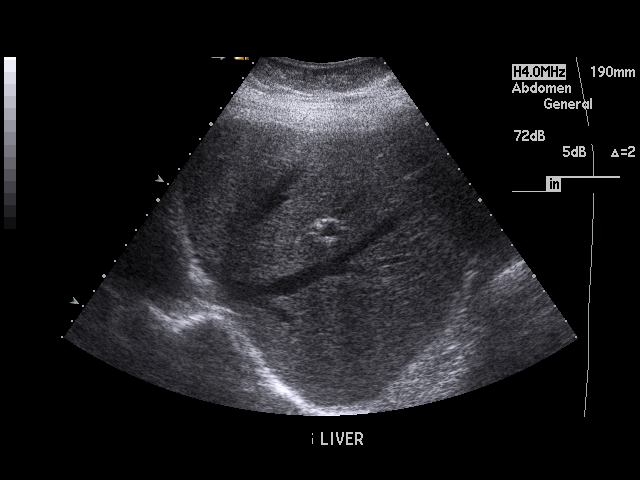
[im 23/51]
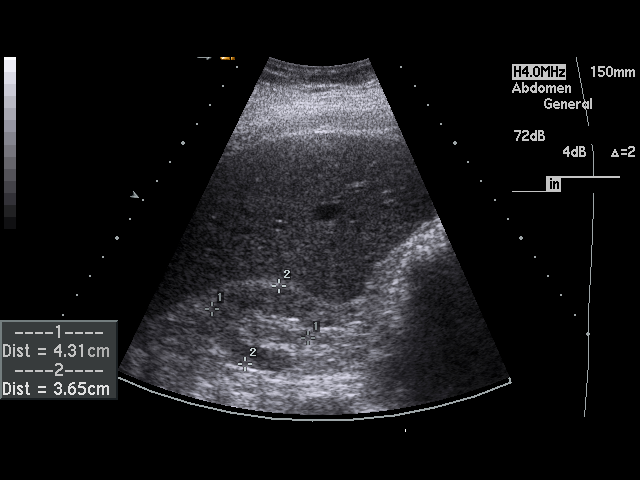
[im 26/51]
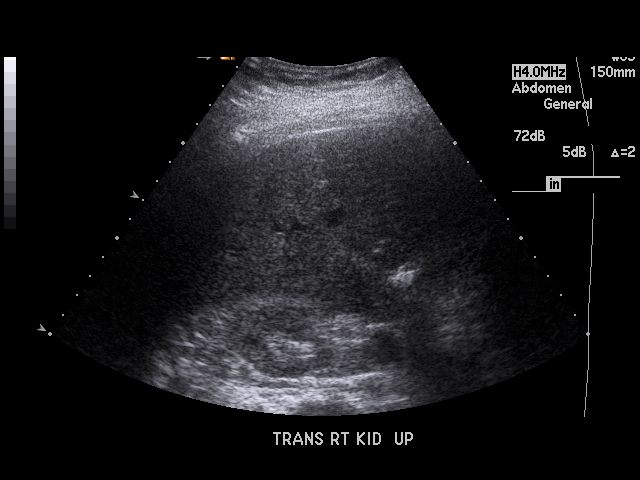
[im 28/51]
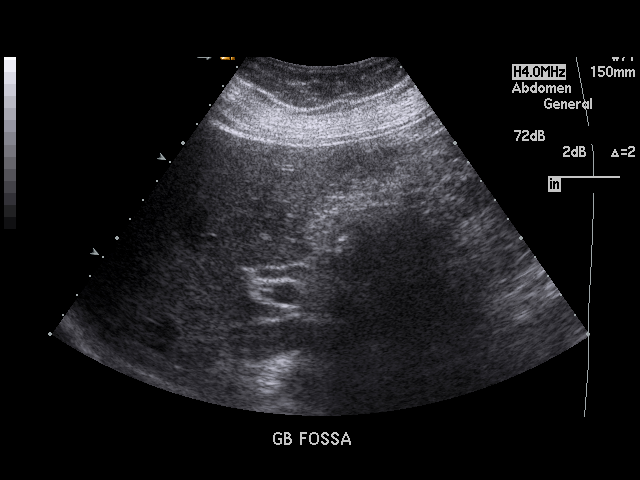
[im 32/51]
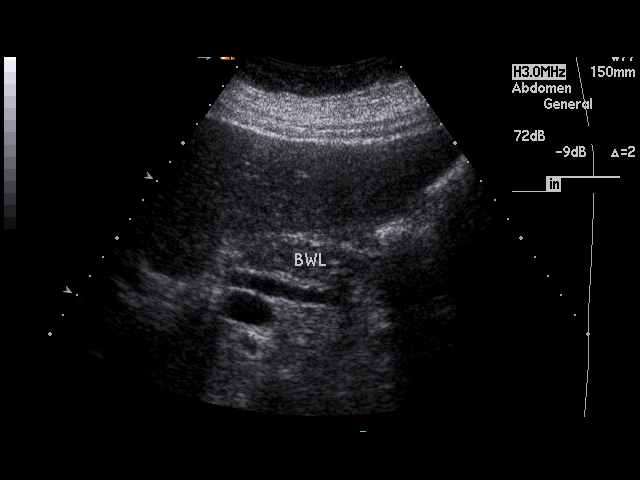
[im 34/51]
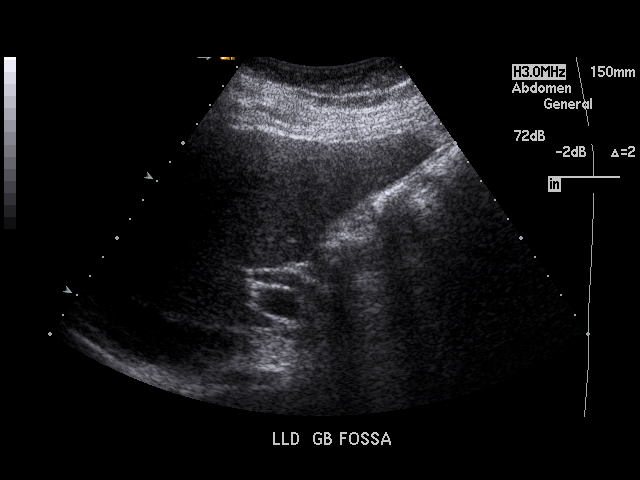
[im 38/51]
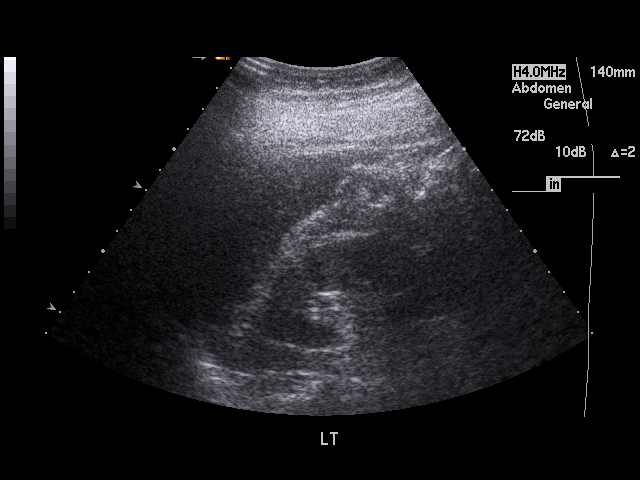
[im 40/51]
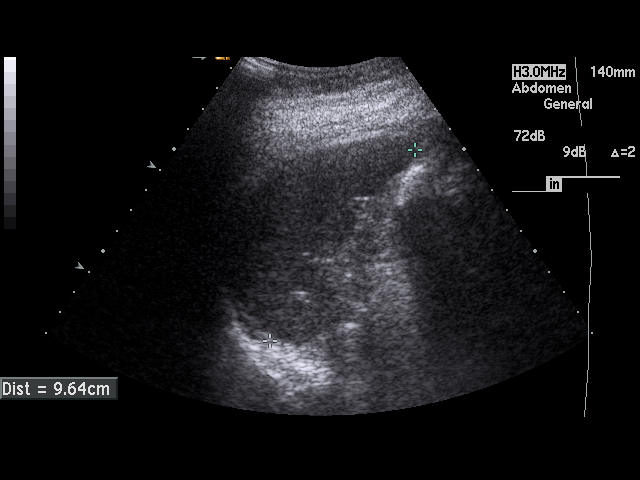
[im 44/51]
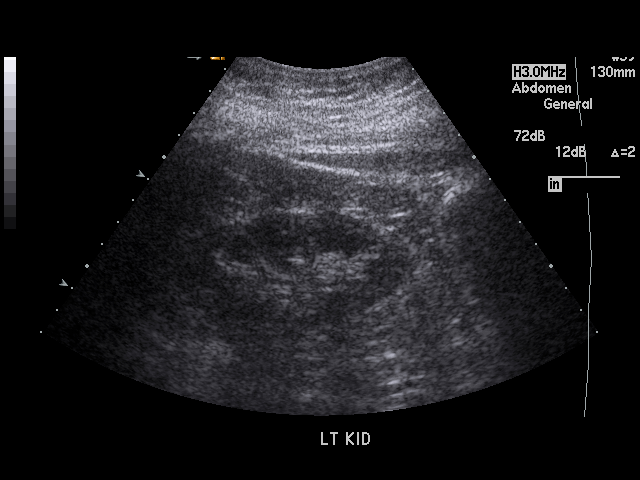
[im 46/51]
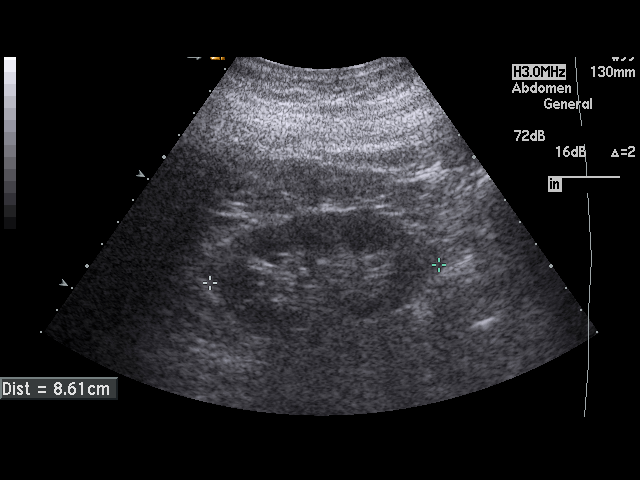
[im 51/51]
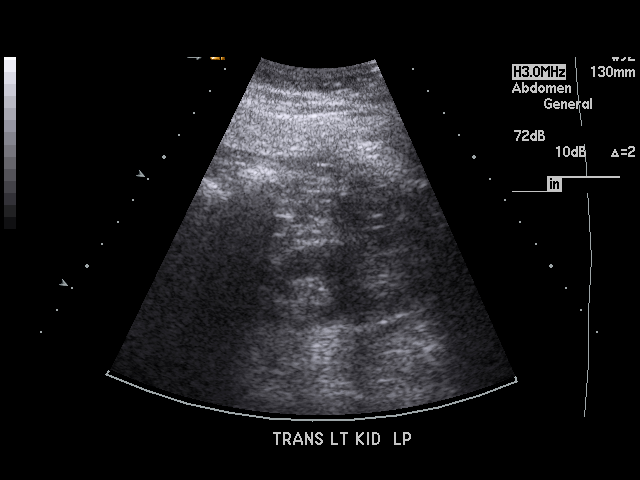

[17 of 25 positions shown; findings below may reference images not displayed]

PROCEDURE:     US  - US ABDOMEN GENERAL SURVEY  - October 11, 2009  [DATE]

RESULT:     There is a history of cholecystectomy. The liver parenchyma has
a normal echotexture. The pancreas, spleen, aorta and common bile duct
appear to be within normal limits. The portal venous flow is normal. The
common bile duct diameter is 8.4 mm which is somewhat prominent but is
within normal limits for a postcholecystectomy patient. The kidneys
demonstrate evidence of atrophy and cortical thinning without obstruction or
mass. No stones are evident. The fluid collection seen on the previous exam
is not evident on today's study.
IMPRESSION: No residual abnormal fluid collection. Cholecystectomy changes are present.
There is evidence of some chronic renal disease. There is a history of the
patient being on dialysis for multiple years.

## 2010-02-19 ENCOUNTER — Inpatient Hospital Stay: Payer: Self-pay | Admitting: Internal Medicine

## 2010-03-07 IMAGING — CT CT HEAD WITHOUT CONTRAST
2 series · 16 of 30 positions shown, 20 images · non-contrast
Comparison: none

REASON FOR EXAM: vertigo-new onset; on dialysis
COMMENTS:

PROCEDURE:     CT  - CT HEAD WITHOUT CONTRAST  - October 30, 2009 [DATE]
RESULT:     Technique: Helical 5mm sections were obtained from the skull
base to the vertex without administration of intravenous contrast.

[Series 2: without · axial · non-contrast · 0.39mm/px · z∈[+910,+1035]mm · 13 of 31 slices shown, 17 images]
[im 3/31  brain]
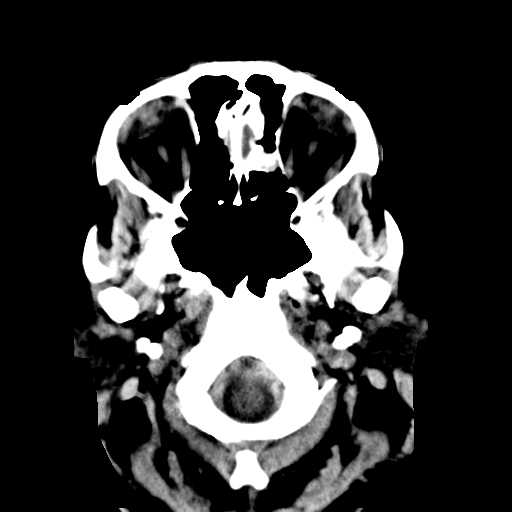
[im 3/31  bone]
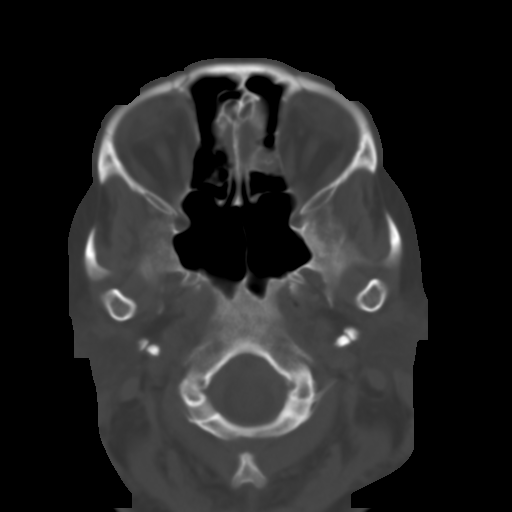
[im 5/31  brain]
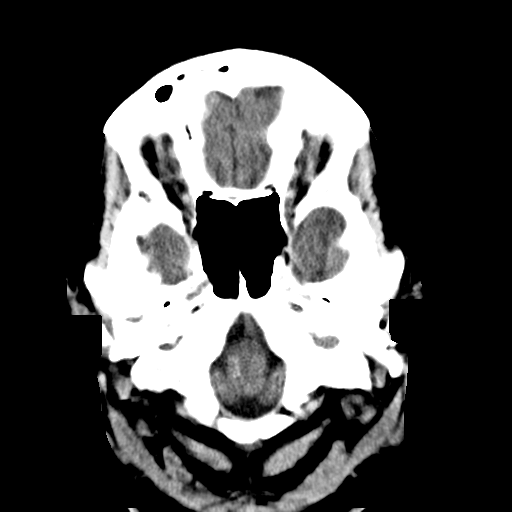
[im 7/31  brain]
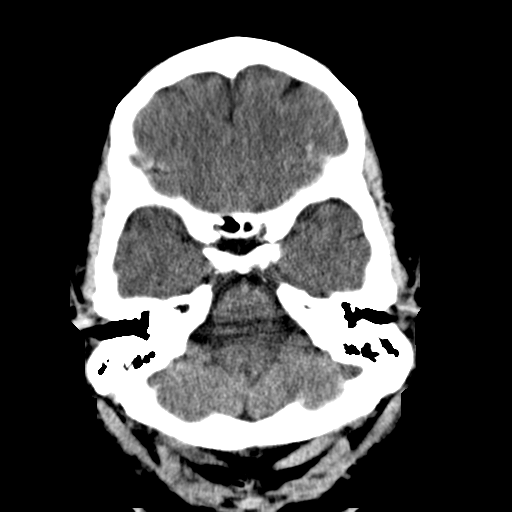
[im 9/31  brain]
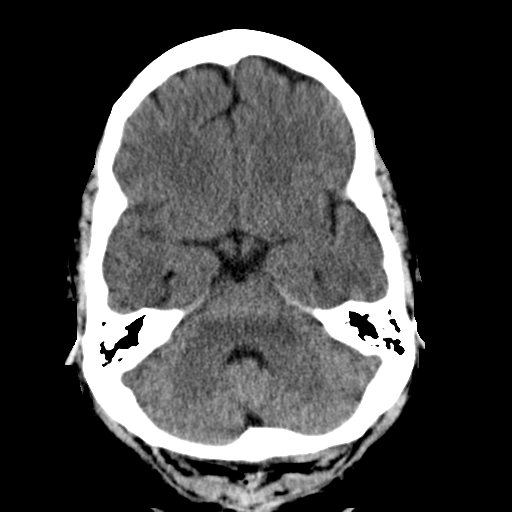
[im 11/31  brain]
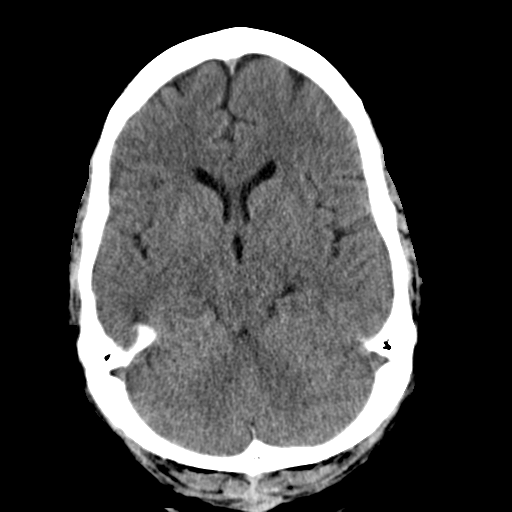
[im 11/31  bone]
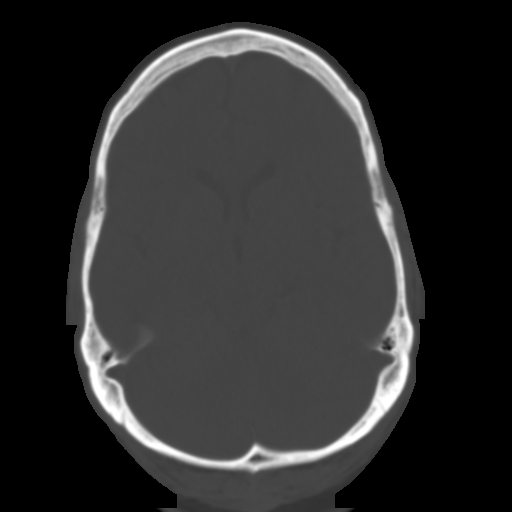
[im 13/31  brain]
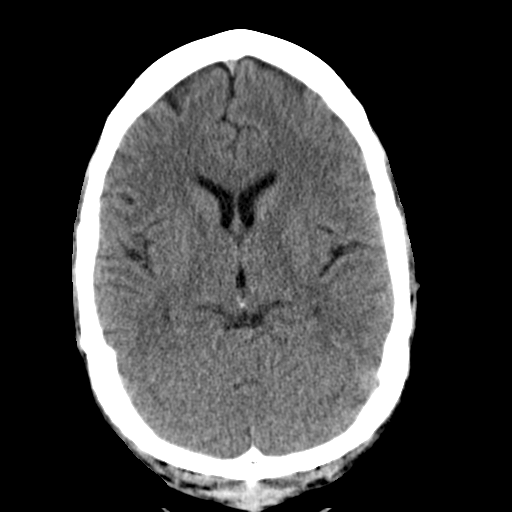
[im 16/31  brain]
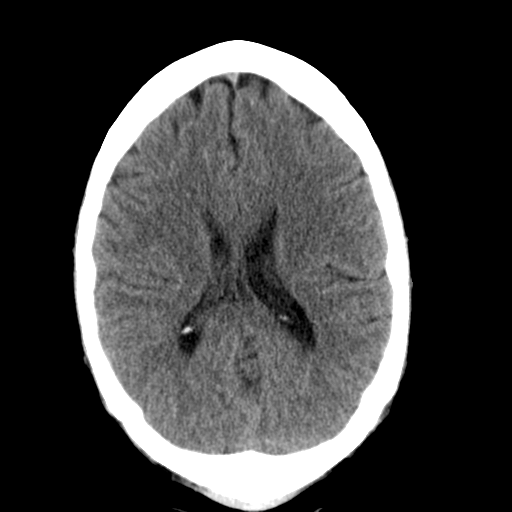
[im 18/31  brain]
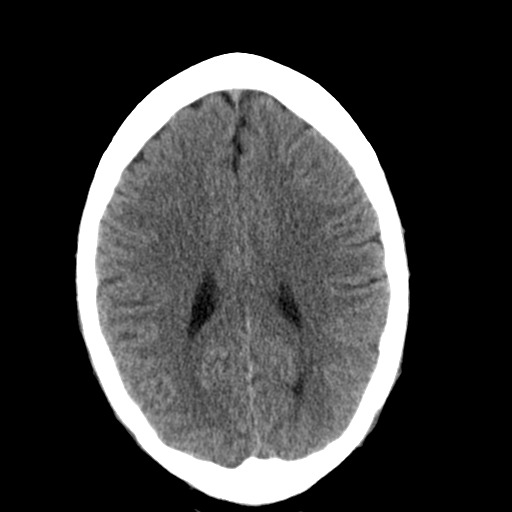
[im 20/31  brain]
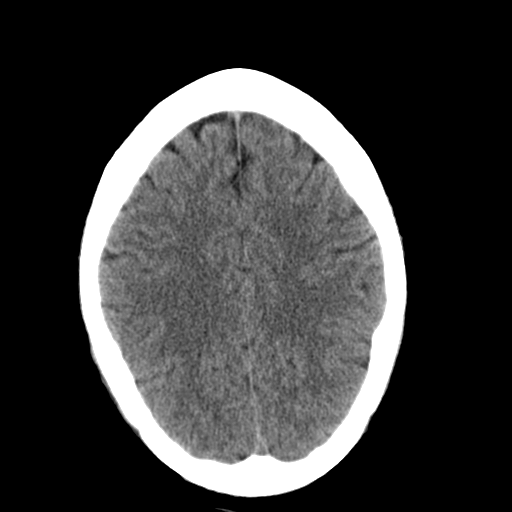
[im 20/31  bone]
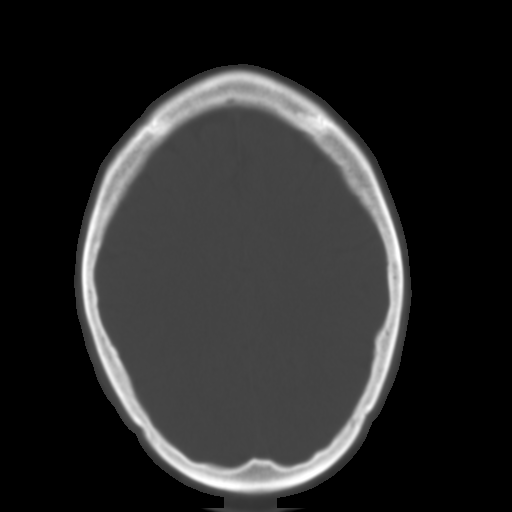
[im 22/31  brain]
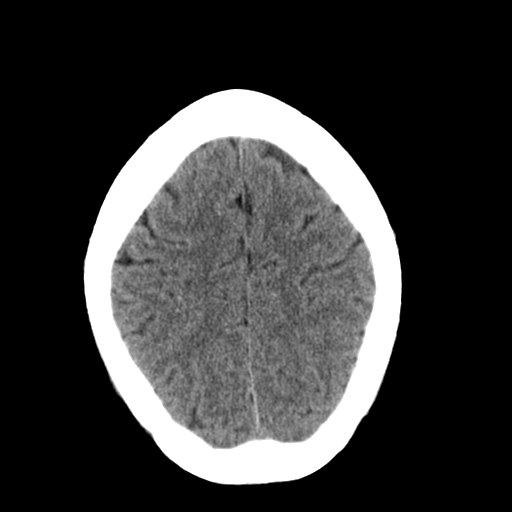
[im 24/31  brain]
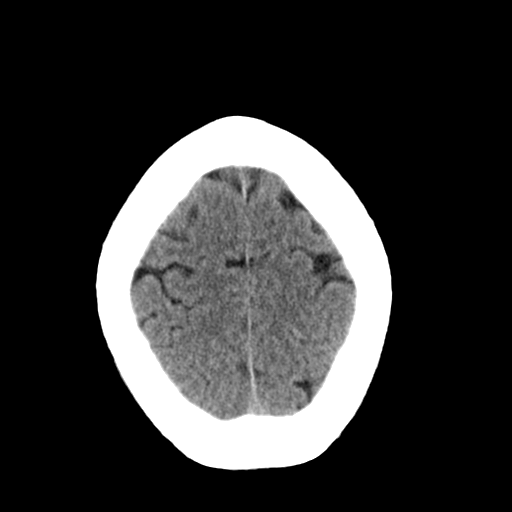
[im 26/31  brain]
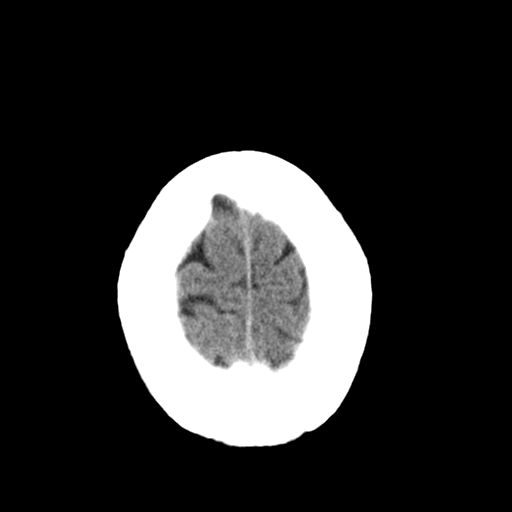
[im 28/31  brain]
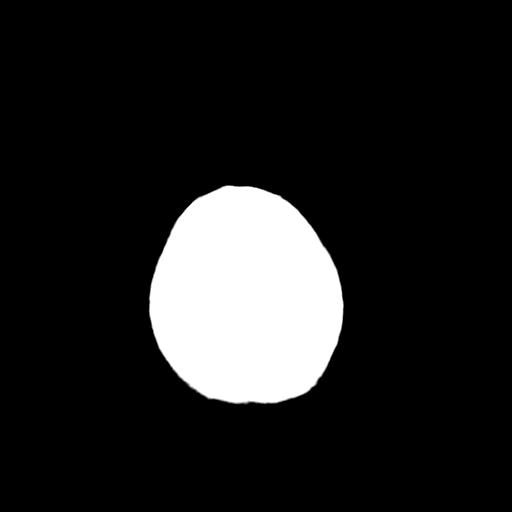
[im 28/31  bone]
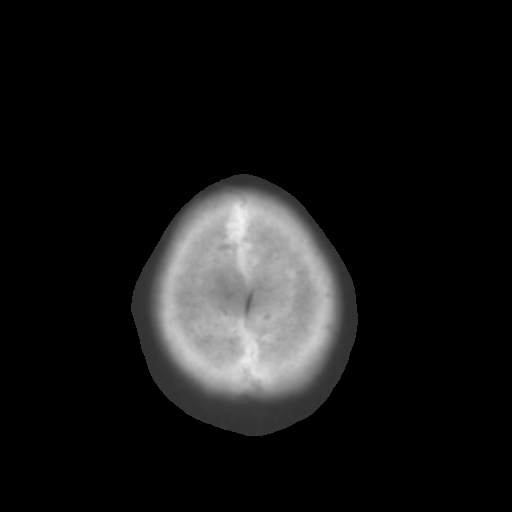

[Series 3: bone · axial · 0.39mm/px · z∈[+910,+950]mm · 3 of 31 slices shown]
[im 3/31  bone]
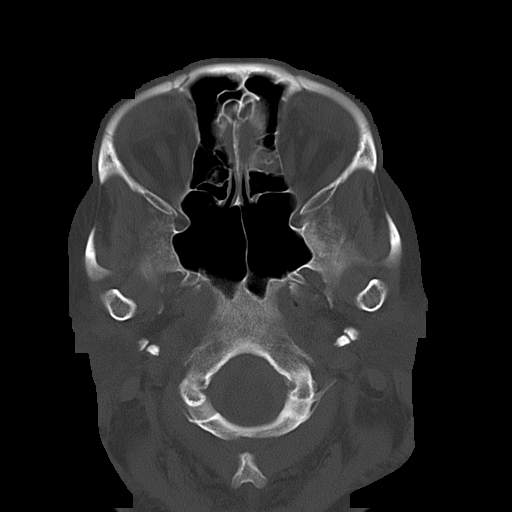
[im 7/31  bone]
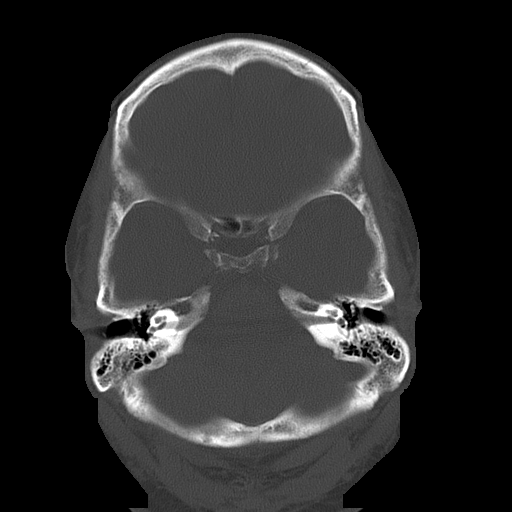
[im 11/31  bone]
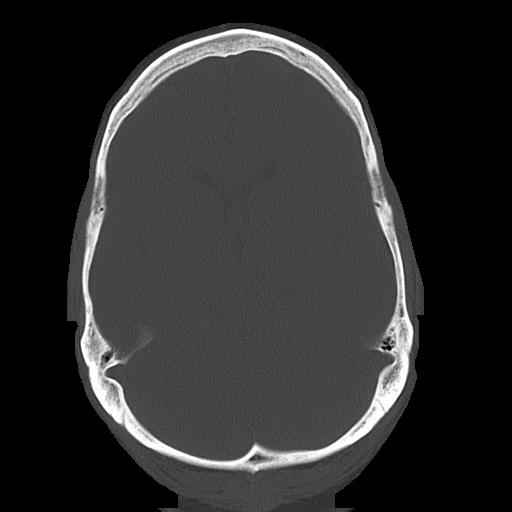

[16 of 30 positions shown; findings below may reference images not displayed]

FINDINGS: There is not evidence of intra-axial fluid collections. There is
no evidence of acute hemorrhage or secondary signs reflecting mass effect or
subacute or chronic focal territorial infarction. The osseous structures
demonstrate no evidence of a depressed skull fracture. If there is
persistent concern clinical follow-up with MRI is recommended.
IMPRESSION: 1. No evidence of acute intracranial abnormalitites.
2. Comparison made to a prior study dated 12/03/2007.

## 2010-03-11 ENCOUNTER — Inpatient Hospital Stay: Payer: Self-pay | Admitting: Internal Medicine

## 2010-03-16 ENCOUNTER — Emergency Department: Payer: Self-pay | Admitting: Unknown Physician Specialty

## 2010-03-17 ENCOUNTER — Emergency Department: Payer: Self-pay | Admitting: Internal Medicine

## 2010-03-18 ENCOUNTER — Inpatient Hospital Stay: Payer: Self-pay | Admitting: Internal Medicine

## 2010-03-28 IMAGING — CR DG CHEST 1V PORT
1 series · 1 of 1 positions shown · non-contrast
Comparison: none

REASON FOR EXAM: sob
COMMENTS:

[view not recorded]
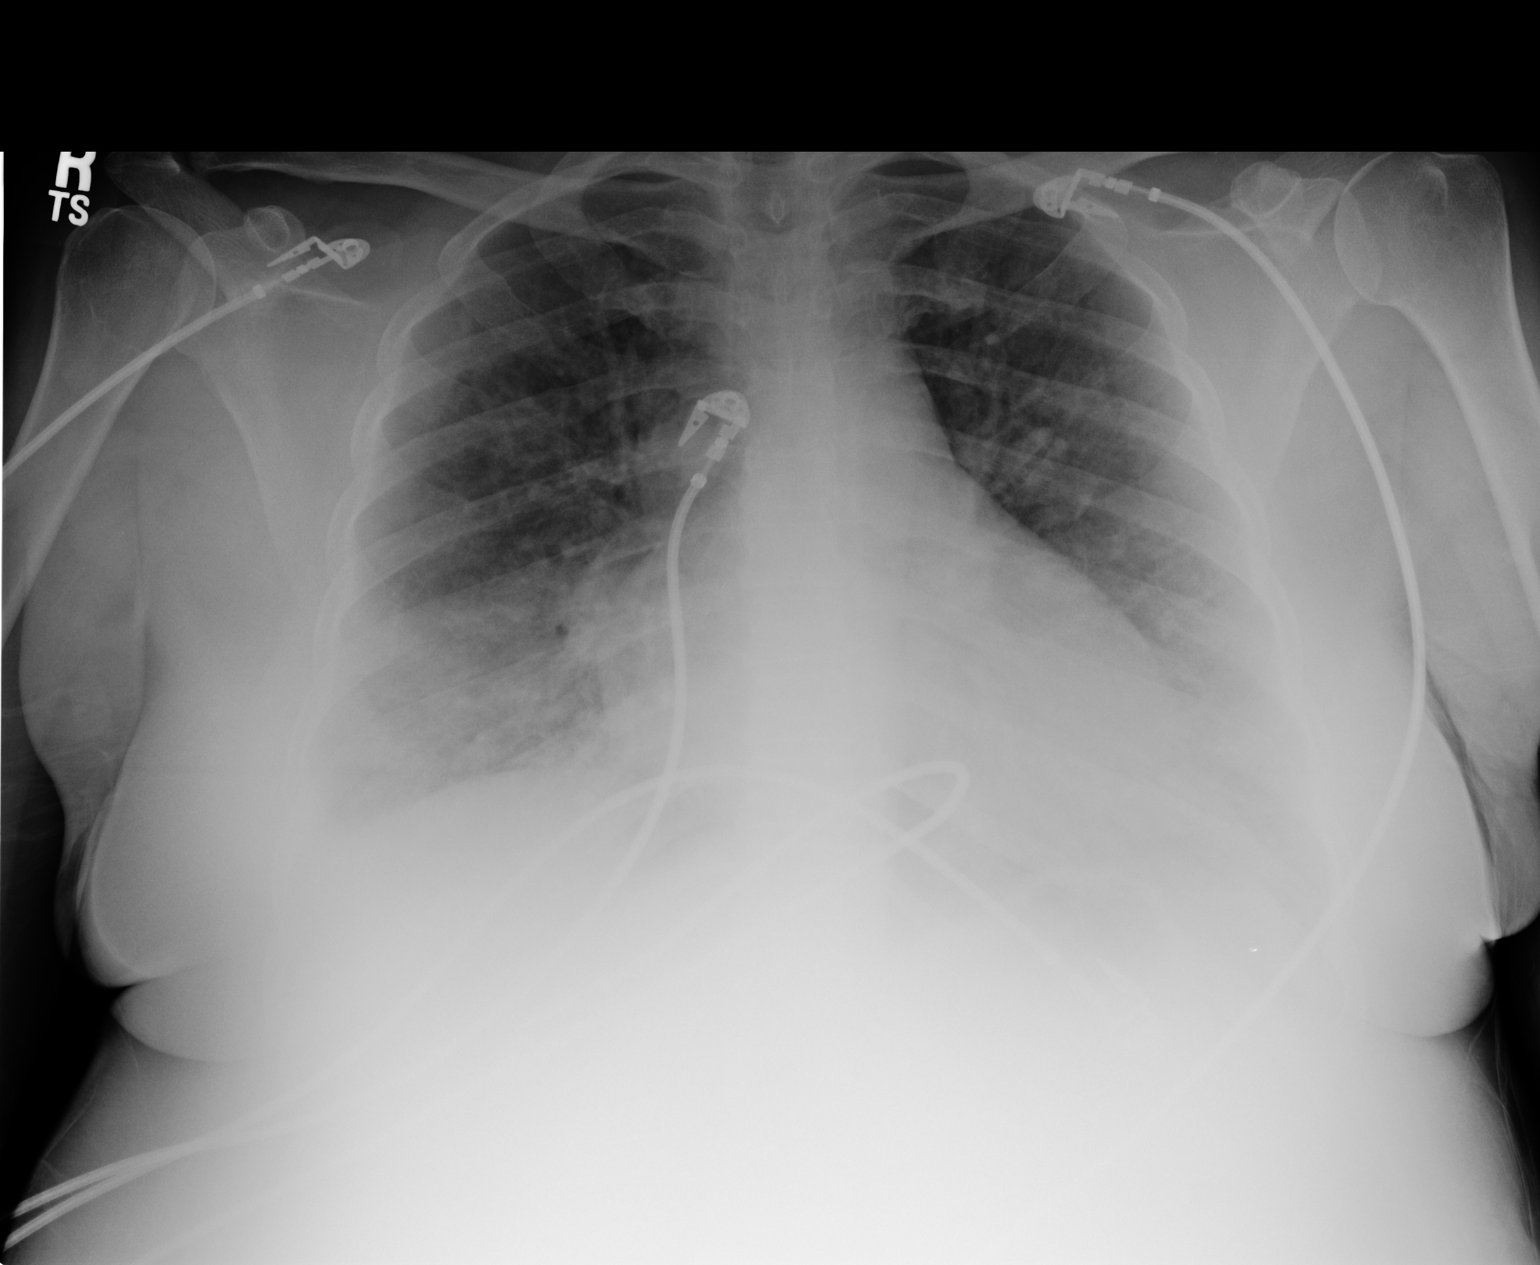

[1 of 1 positions shown; findings below may reference images not displayed]

PROCEDURE:     DXR - DXR PORTABLE CHEST SINGLE VIEW  - November 20, 2009  [DATE]

RESULT:     Comparison is made to the examination dated 08/17/2009.

There is shallow inspiratory effort. Monitoring electrodes are present. The
cardiac silhouette is mildly enlarged. Basilar atelectasis appears present.
Follow-up with erect PA and lateral views of the chest. No pneumothorax,
definite edema or focal infiltrate is evident.
IMPRESSION: Basilar atelectasis with cardiomegaly.

## 2010-03-30 IMAGING — CR DG CHEST 1V PORT
1 series · 1 of 1 positions shown · non-contrast
Comparison: none

REASON FOR EXAM: central line placement
COMMENTS:

[view not recorded]
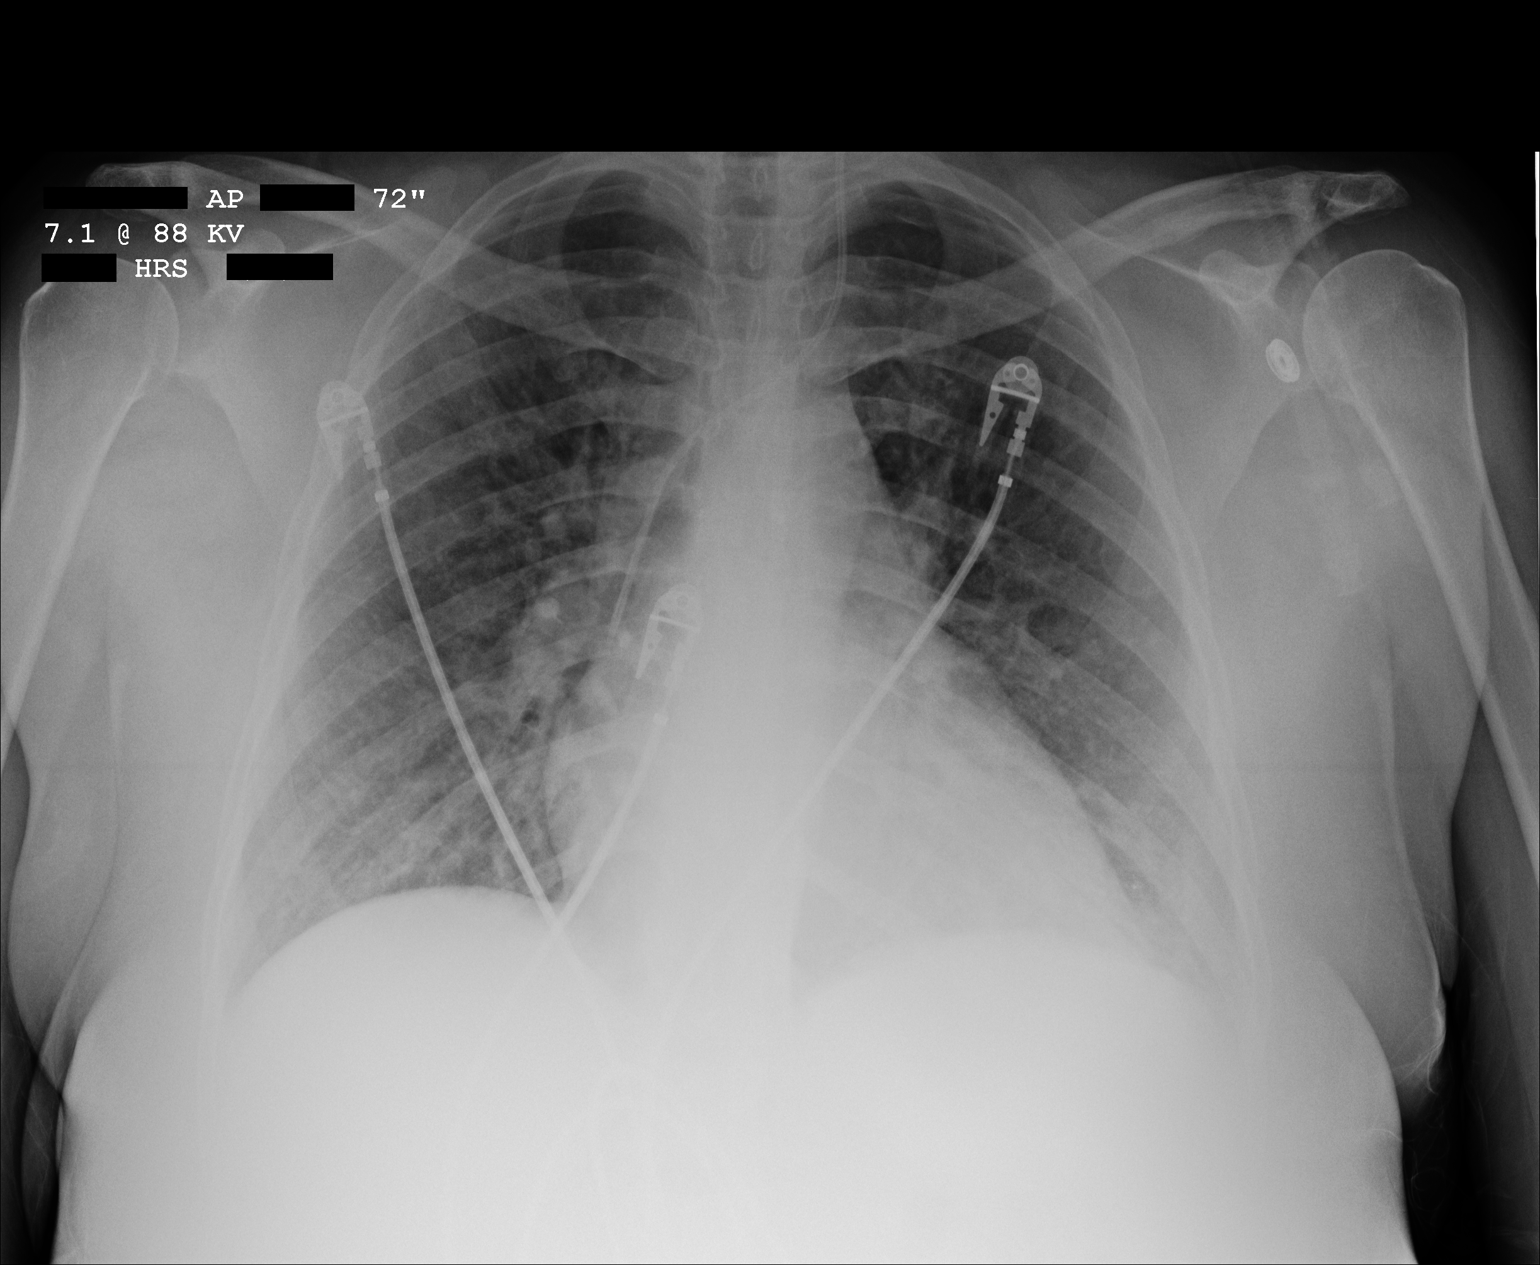

[1 of 1 positions shown; findings below may reference images not displayed]

PROCEDURE:     DXR - DXR PORTABLE CHEST SINGLE VIEW  - November 22, 2009 [DATE]

RESULT:     Comparison is made to the study of 11/20/2009. There is a left
internal jugular central venous catheter present with the tip in the
superior vena cava. There is pulmonary edema. There is no pneumothorax. The
heart is borderline enlarged to mildly enlarged. Cardiac monitoring
electrodes are present.
IMPRESSION: 1.     Pulmonary edema.
2.     Placement of a left internal jugular central venous catheter as
described without complication.

## 2010-04-11 IMAGING — MR MRI HEAD WITHOUT CONTRAST
6 of 8 series · 33 of 48 positions shown · non-contrast
Comparison: none

REASON FOR EXAM: sudden hearing loss
COMMENTS:

PROCEDURE:     MR  - MR BRAIN W/O INCL IAC W/O ROUTIN  - December 04, 2009  [DATE]
RESULT:
HISTORY: Right sided hearing loss, left sided headache, pain behind right
ear.

[Series 3: T1 · axial · 10.0mm · 1.15mm/px · z∈[+0,+103]mm · 6 of 26 slices shown (1 of 2)]
[im 1/26]
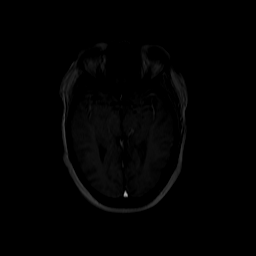
[im 6/26]
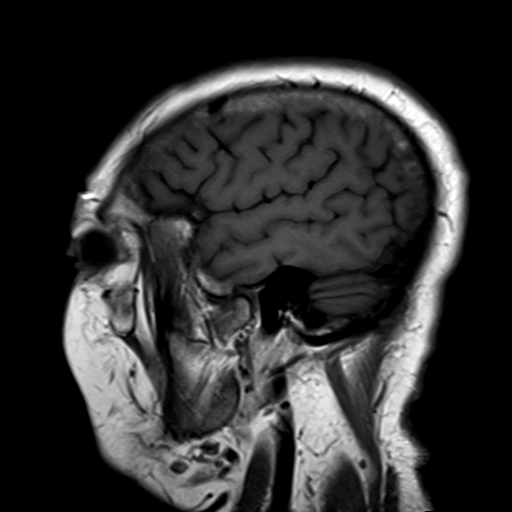
[im 11/26]
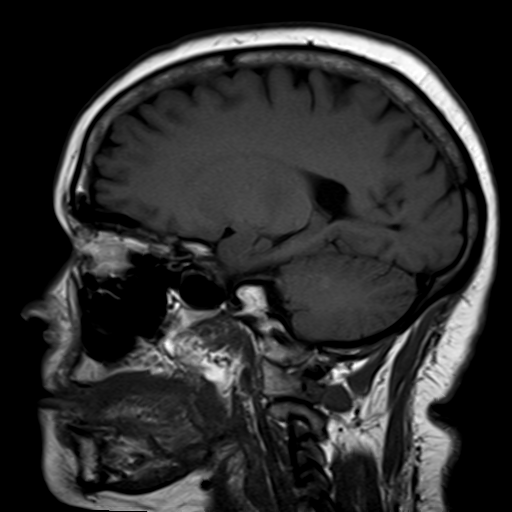
[im 16/26]
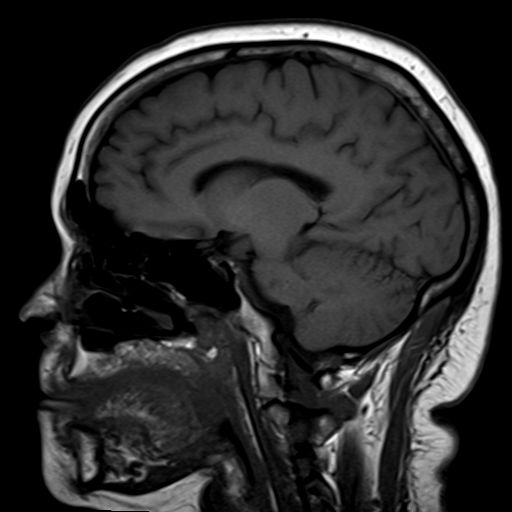
[im 21/26]
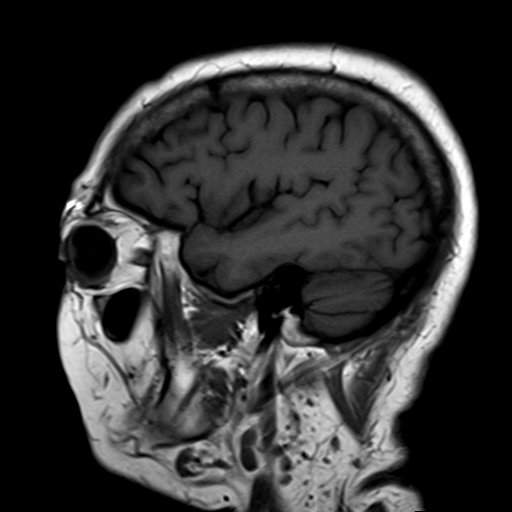
[im 26/26]
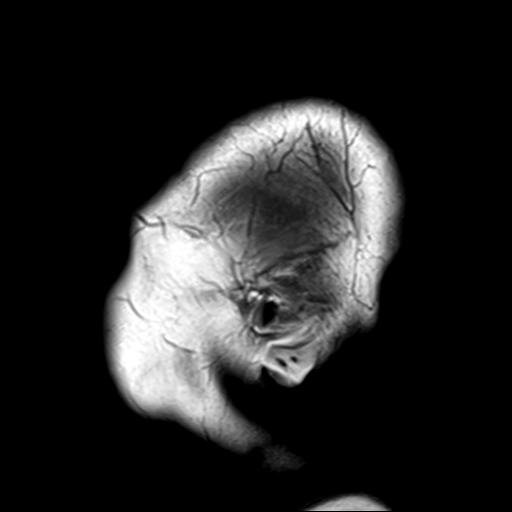

[Series 6: T2 · axial · 4.0mm · 0.60mm/px · z∈[-63,+126]mm · 8 of 36 slices shown]
[im 1/36]
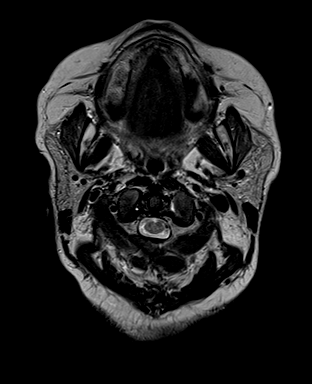
[im 6/36]
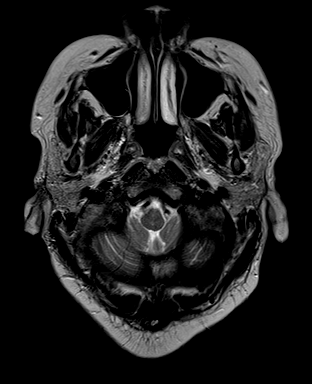
[im 11/36]
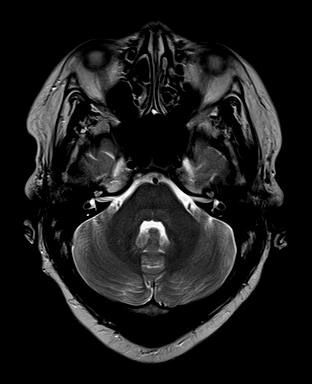
[im 16/36]
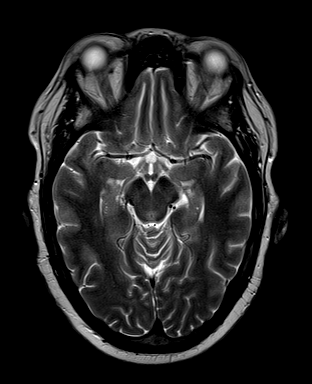
[im 21/36]
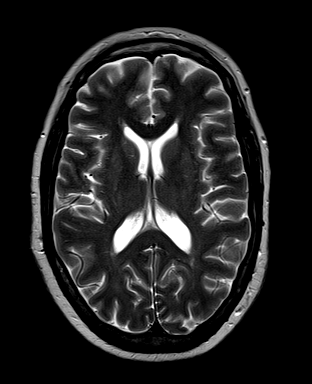
[im 26/36]
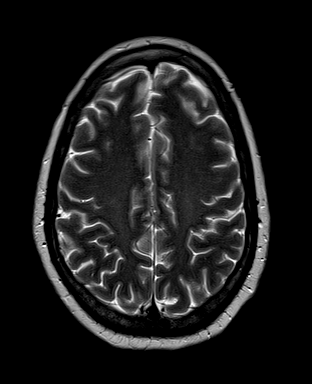
[im 31/36]
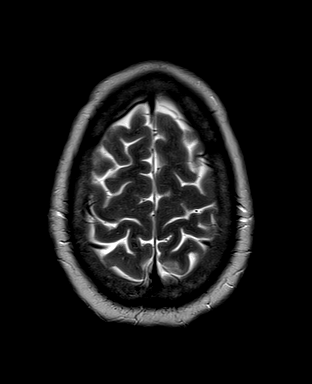
[im 36/36]
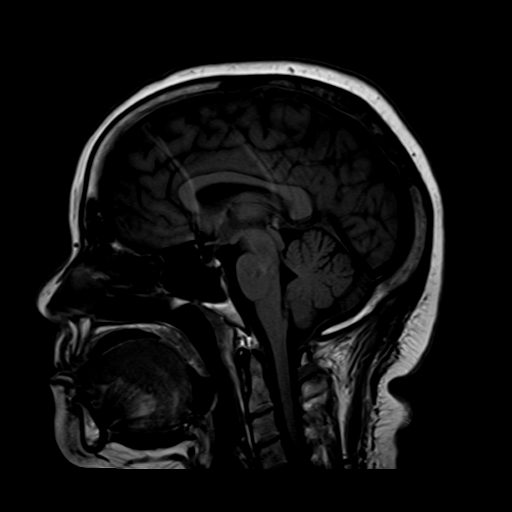

[Series 7: t1_ax brain · axial · 4.0mm · 0.45mm/px · z∈[-63,+25]mm · 5 of 36 slices shown]
[im 1/36]
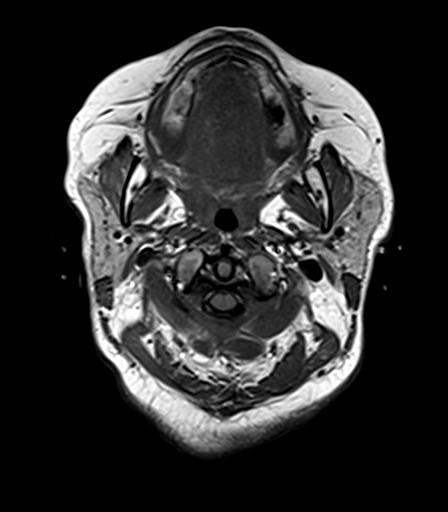
[im 6/36]
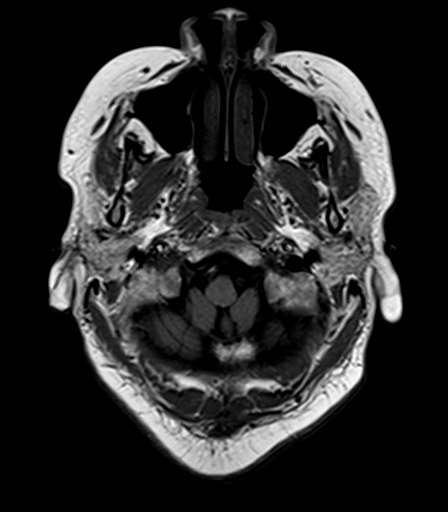
[im 11/36]
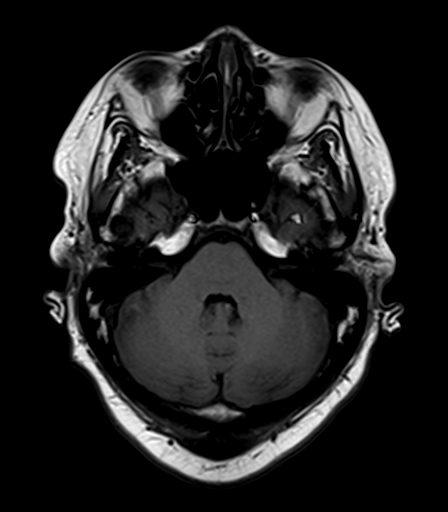
[im 16/36]
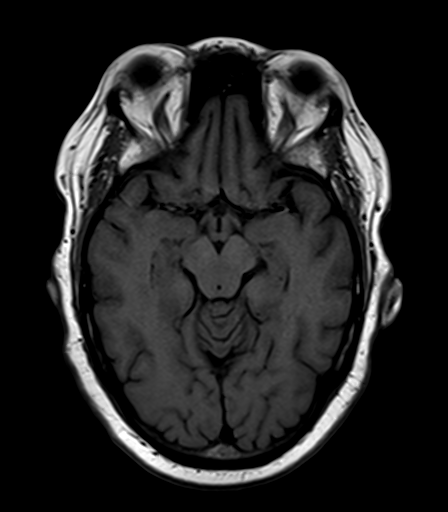
[im 21/36]
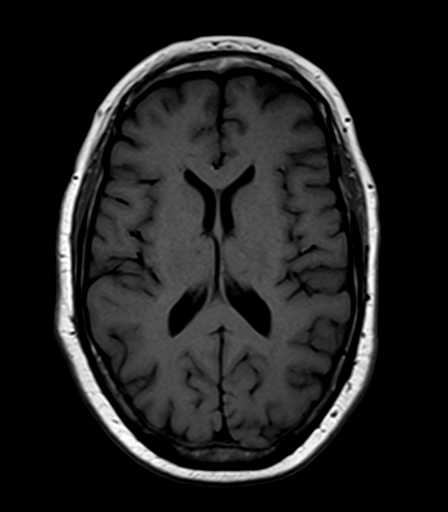

[Series 9: T1 · axial · 3.0mm · 0.39mm/px · z∈[-36,+90]mm · 3 of 12 slices shown (2 of 2)]
[im 1/12]
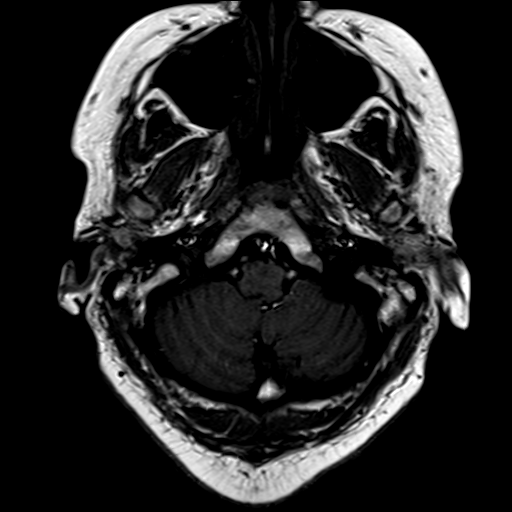
[im 6/12]
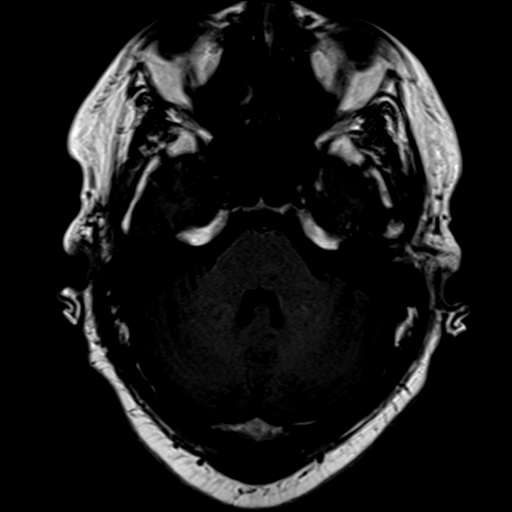
[im 12/12]
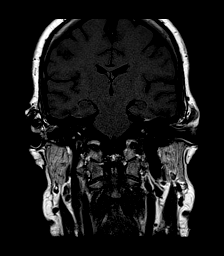

[Series 5002: DWI · axial · 5.0mm · 1.80mm/px · z∈[-57,+128]mm · 5 of 24 slices shown]
[im 1/24]
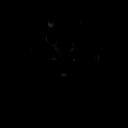
[im 6/24]
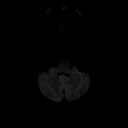
[im 12/24]
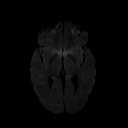
[im 18/24]
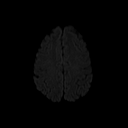
[im 24/24]
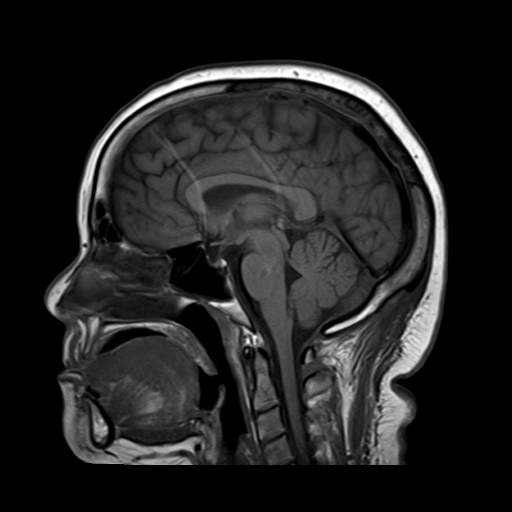

[Series 5004: ADC · axial · 5.0mm · 1.80mm/px · z∈[-63,+125]mm · 6 of 25 slices shown]
[im 1/25]
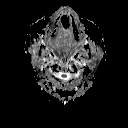
[im 5/25]
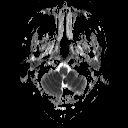
[im 10/25]
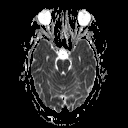
[im 15/25]
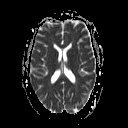
[im 20/25]
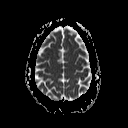
[im 25/25]
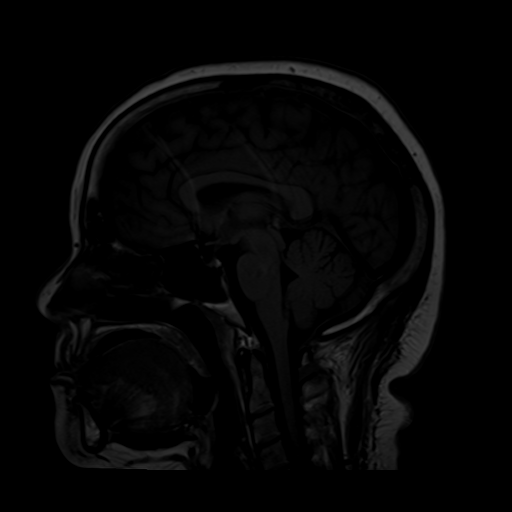

[33 of 48 positions shown; findings below may reference images not displayed]

COMPARISON STUDIES:  Head CT of 10/30/2009.

PROCEDURE AND FINDINGS:  Multiplanar/multisequence imaging of the brain is
obtained.

Diffusion weighted images reveal no evidence of acute ischemia. No
Gadolinium was given due to the patient's renal function. The posterior
fossa including the eighth nerve complexes and cerebellopontine angles are
normal. No supratentorial abnormality is identified. There is no
hydrocephalus. Vascular flow-voids are normal.
IMPRESSION: Negative exam.

## 2010-04-28 ENCOUNTER — Emergency Department: Payer: Self-pay | Admitting: Internal Medicine

## 2010-04-29 ENCOUNTER — Observation Stay: Payer: Self-pay | Admitting: Internal Medicine

## 2010-05-04 ENCOUNTER — Emergency Department: Payer: Self-pay | Admitting: Emergency Medicine

## 2010-05-12 ENCOUNTER — Emergency Department: Payer: Self-pay | Admitting: Emergency Medicine

## 2010-05-13 ENCOUNTER — Inpatient Hospital Stay: Payer: Self-pay | Admitting: Internal Medicine

## 2010-05-21 ENCOUNTER — Emergency Department: Payer: Self-pay | Admitting: Emergency Medicine

## 2010-05-22 ENCOUNTER — Emergency Department: Payer: Self-pay | Admitting: Emergency Medicine

## 2010-05-22 ENCOUNTER — Inpatient Hospital Stay: Admission: AD | Admit: 2010-05-22 | Discharge: 2010-06-13 | Payer: Self-pay | Admitting: *Deleted

## 2010-06-03 IMAGING — CR DG CHEST 1V PORT
1 series · 1 of 1 positions shown · non-contrast
Comparison: none

REASON FOR EXAM: chest pain
COMMENTS:

PROCEDURE:     DXR - DXR PORTABLE CHEST SINGLE VIEW  - January 26, 2010  [DATE]
RESULT:     The lungs are clear. The cardiac silhouette and visualized bony
skeleton are unremarkable.

[view not recorded]
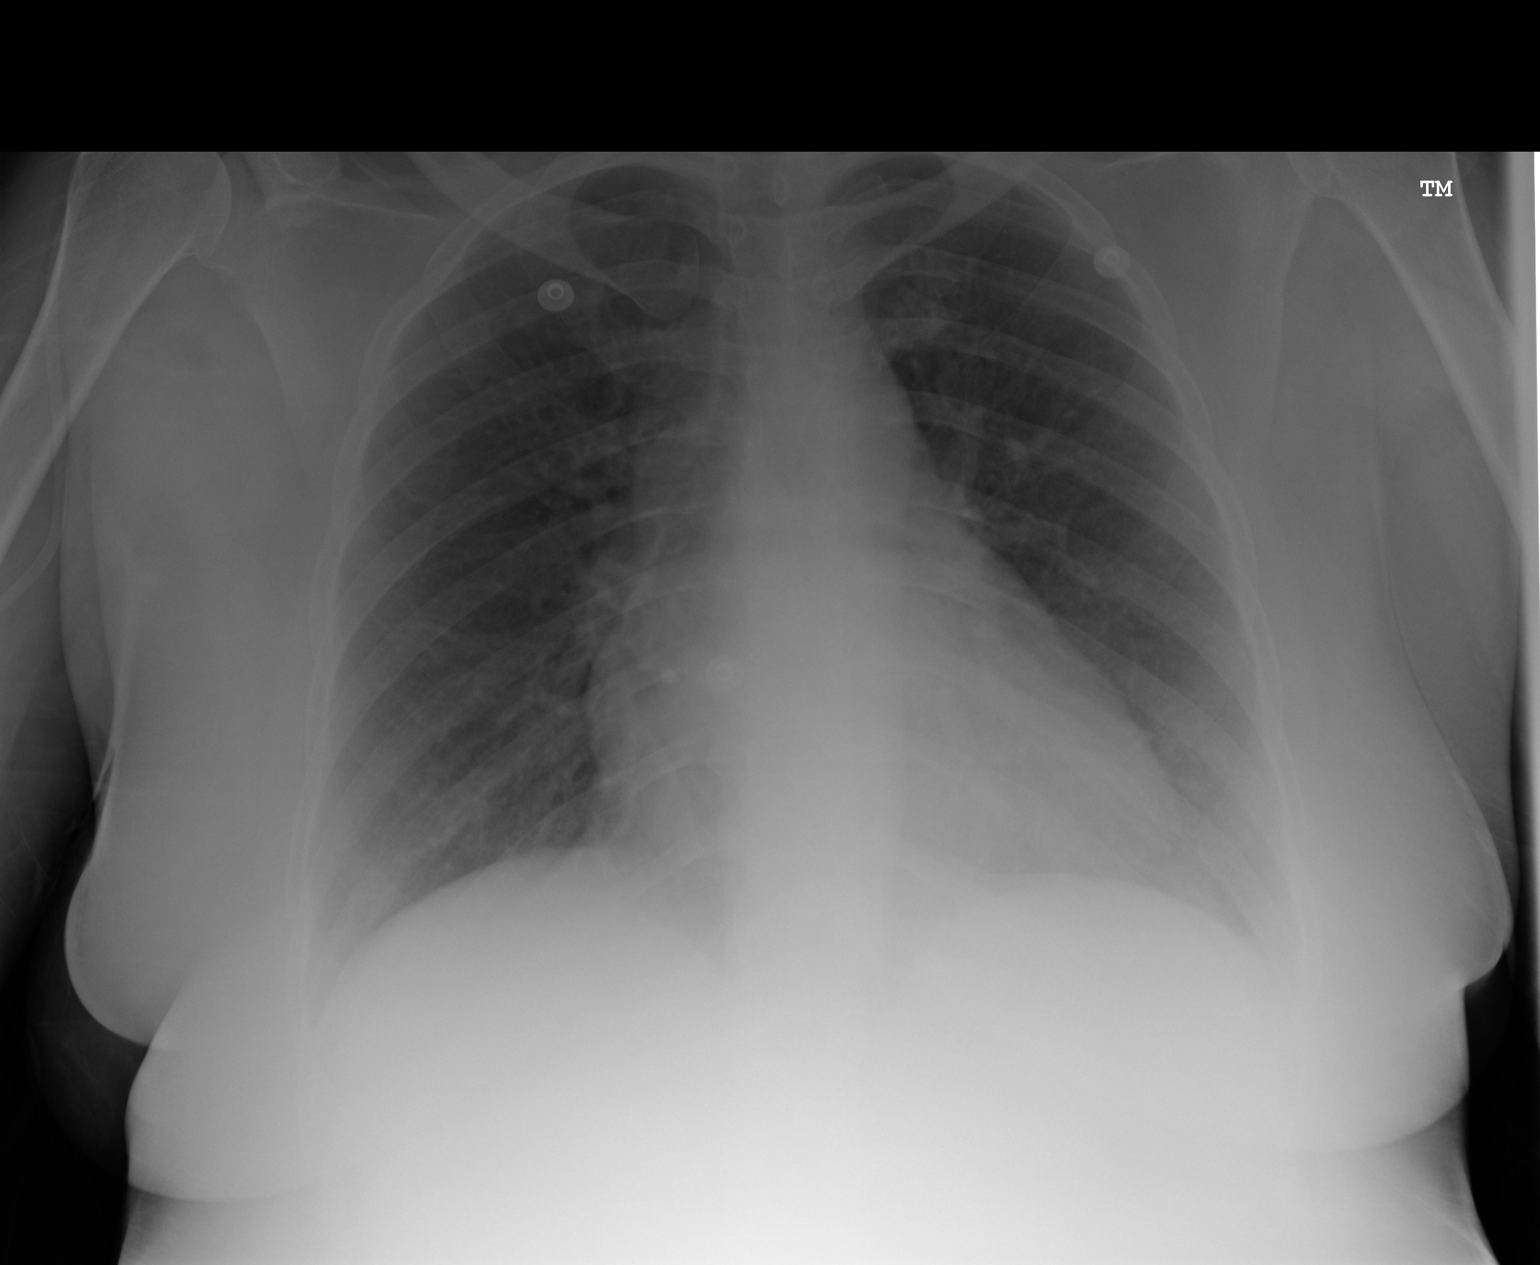

[1 of 1 positions shown; findings below may reference images not displayed]

IMPRESSION: 1. Chest radiograph without evidence of acute cardiopulmonary disease.
2. Comparison made to a prior study dated 11/22/2009.

## 2010-06-03 IMAGING — CR DG ABDOMEN 1V
1 series · 1 of 1 positions shown · non-contrast
Comparison: none

REASON FOR EXAM: abd pain  PORTABLE
COMMENTS:

PROCEDURE:     DXR - DXR KIDNEY URETER BLADDER  - January 26, 2010  [DATE]
RESULT:
There is a paucity of bowel gas. Residual flocculated barium is appreciated
within the ascending colon and sigmoid colon regions. The visualized bony
skeleton demonstrates no evidence of fracture no dislocation.

[view not recorded]
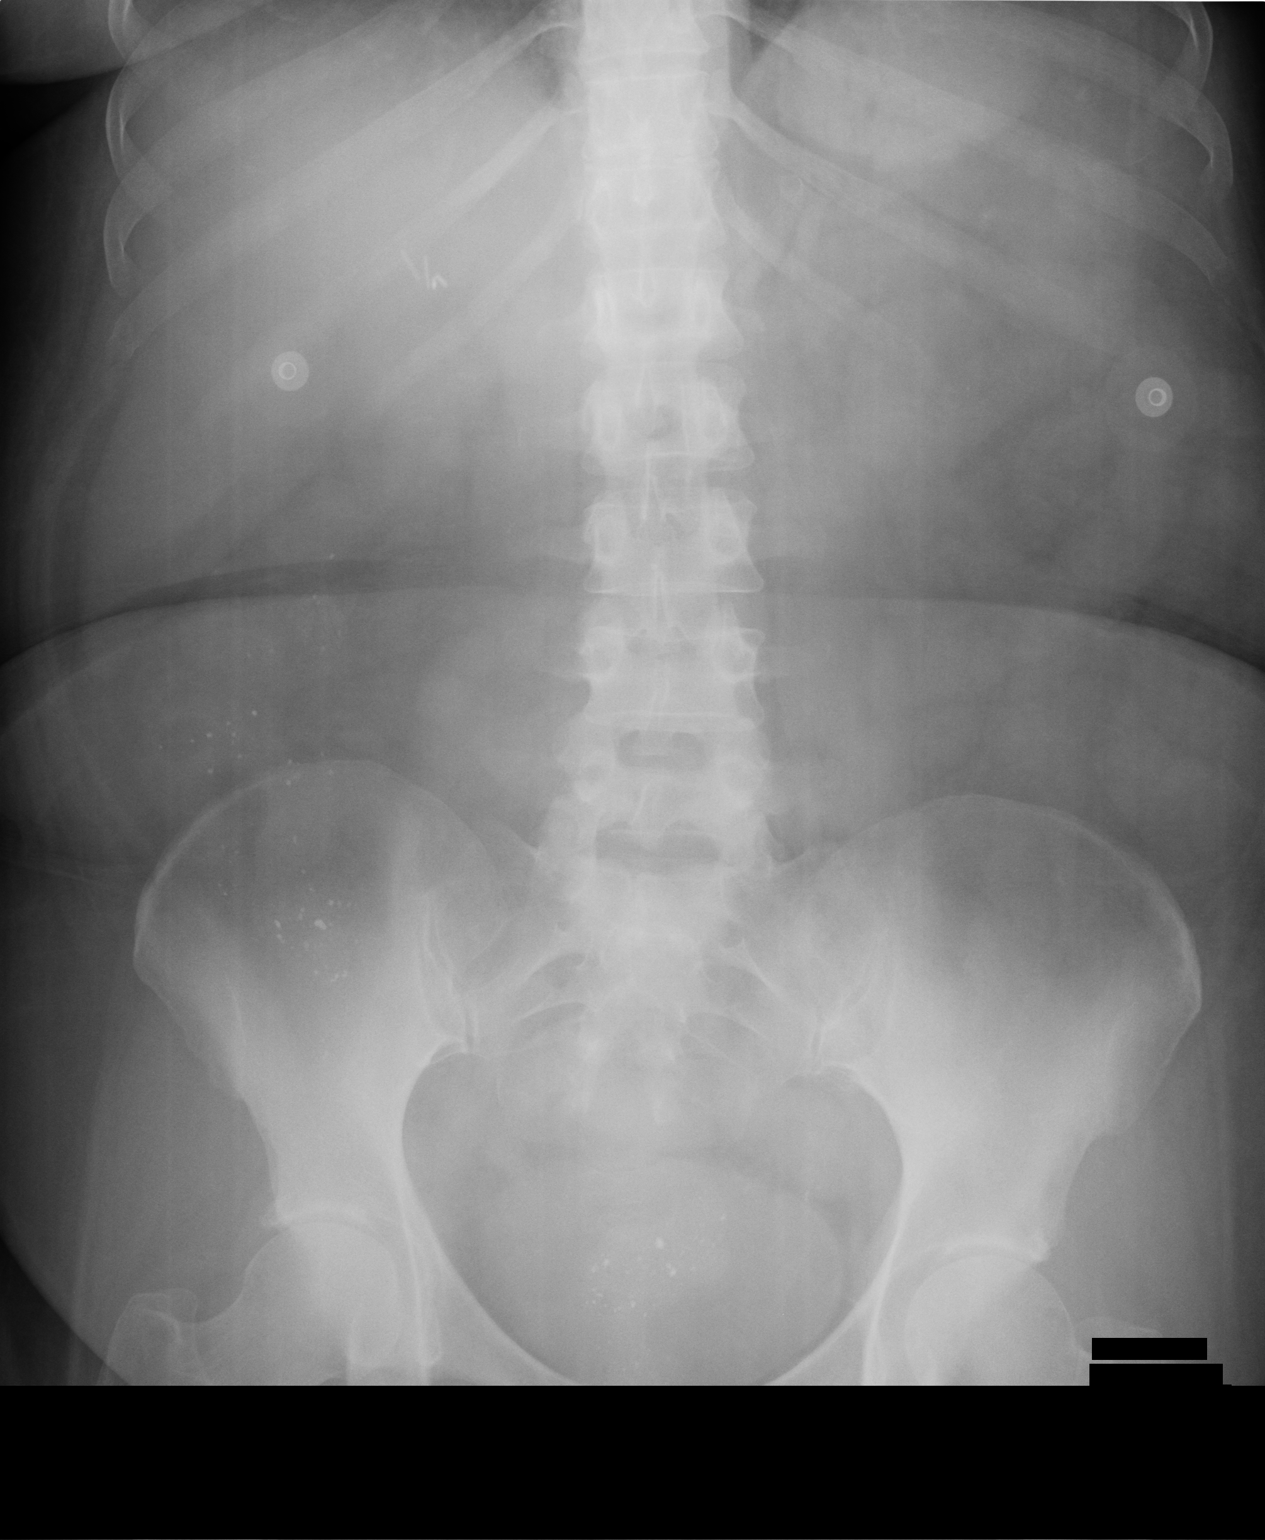

[1 of 1 positions shown; findings below may reference images not displayed]

IMPRESSION: Nonobstructive bowel gas pattern.

## 2010-06-07 IMAGING — CR DG SHOULDER 3+V*L*
1 series · 3 of 3 positions shown · non-contrast
Comparison: none

REASON FOR EXAM: pain
COMMENTS:

[Series 1: view not recorded · 0.17mm/px · 3 of 3 slices shown]
[im 1/3]
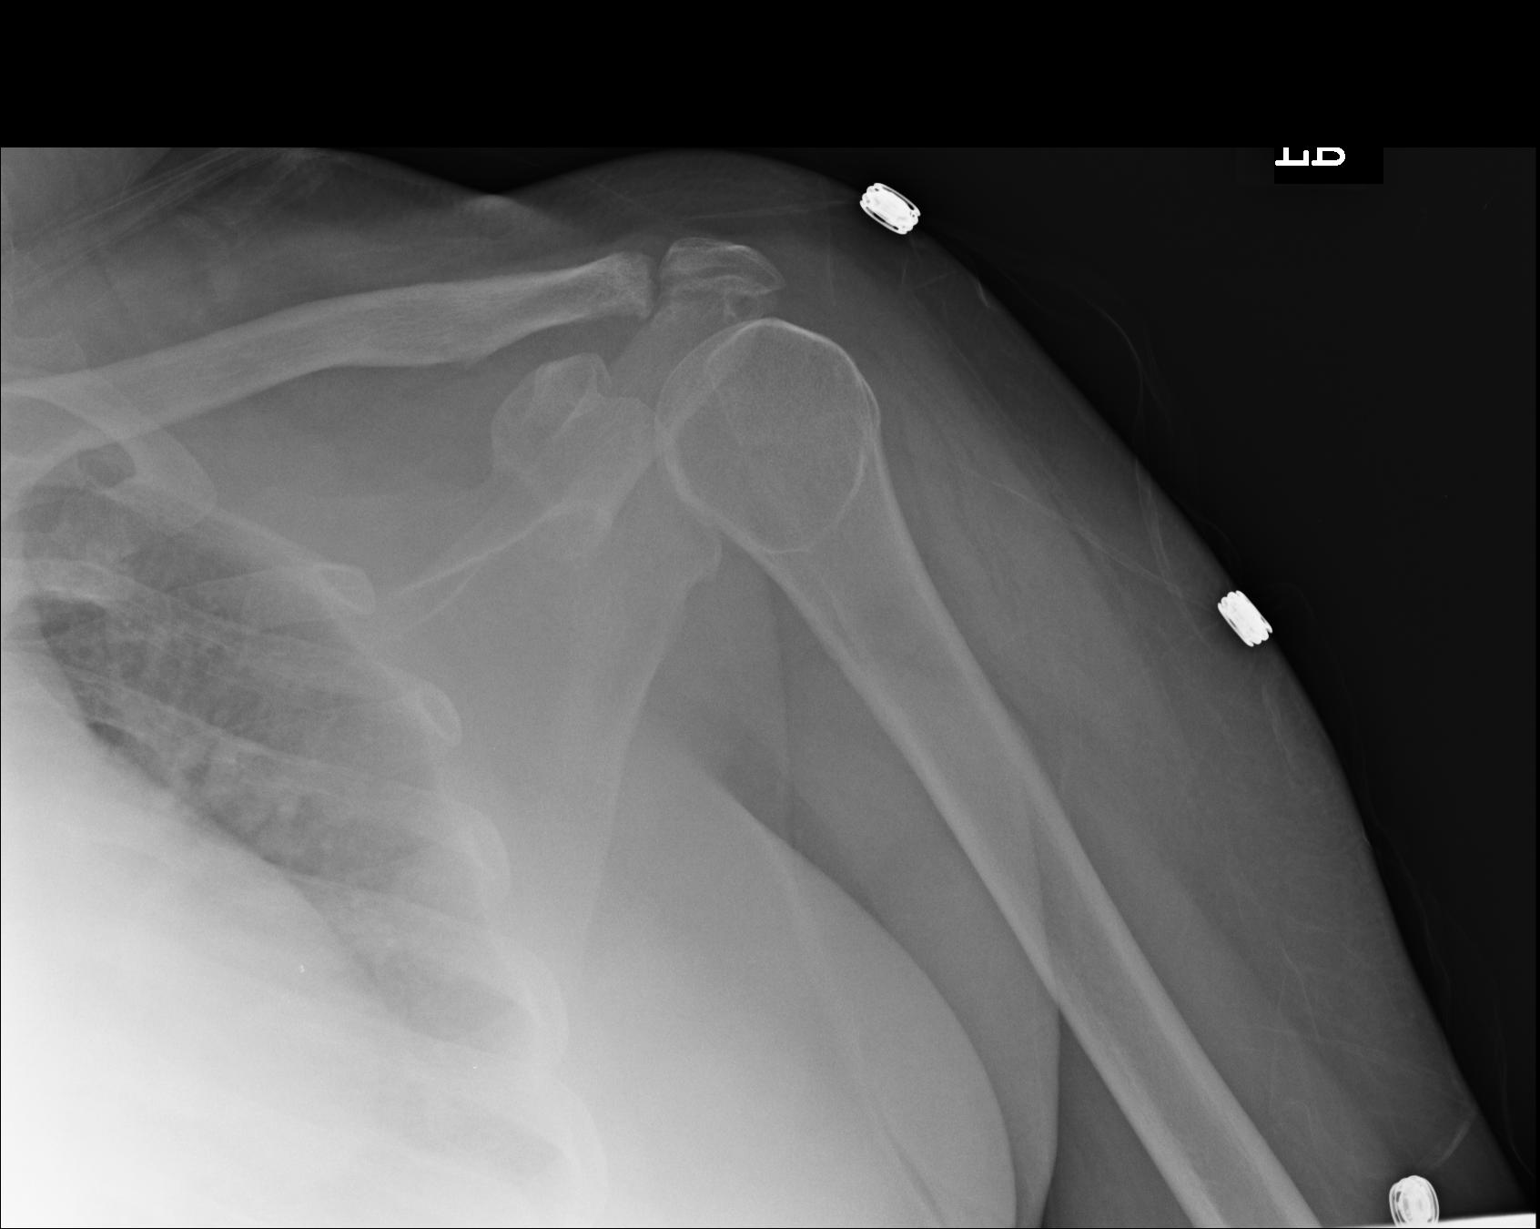
[im 2/3]
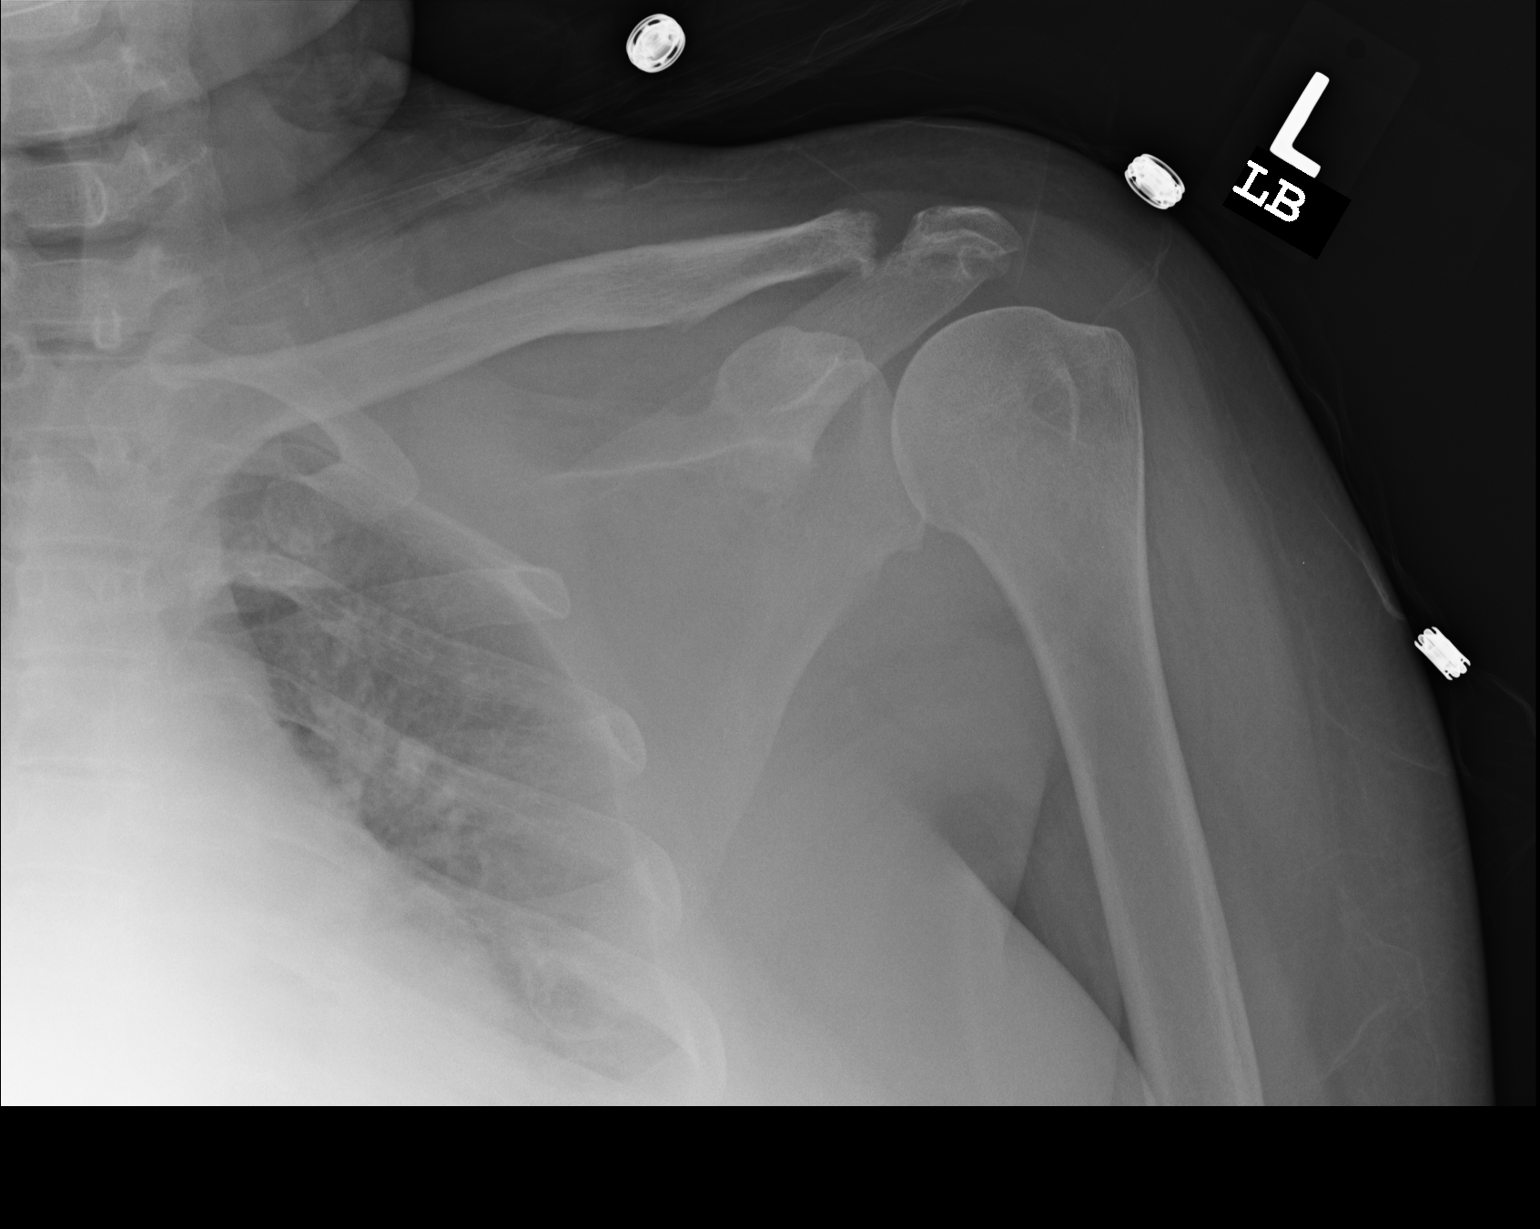
[im 3/3]
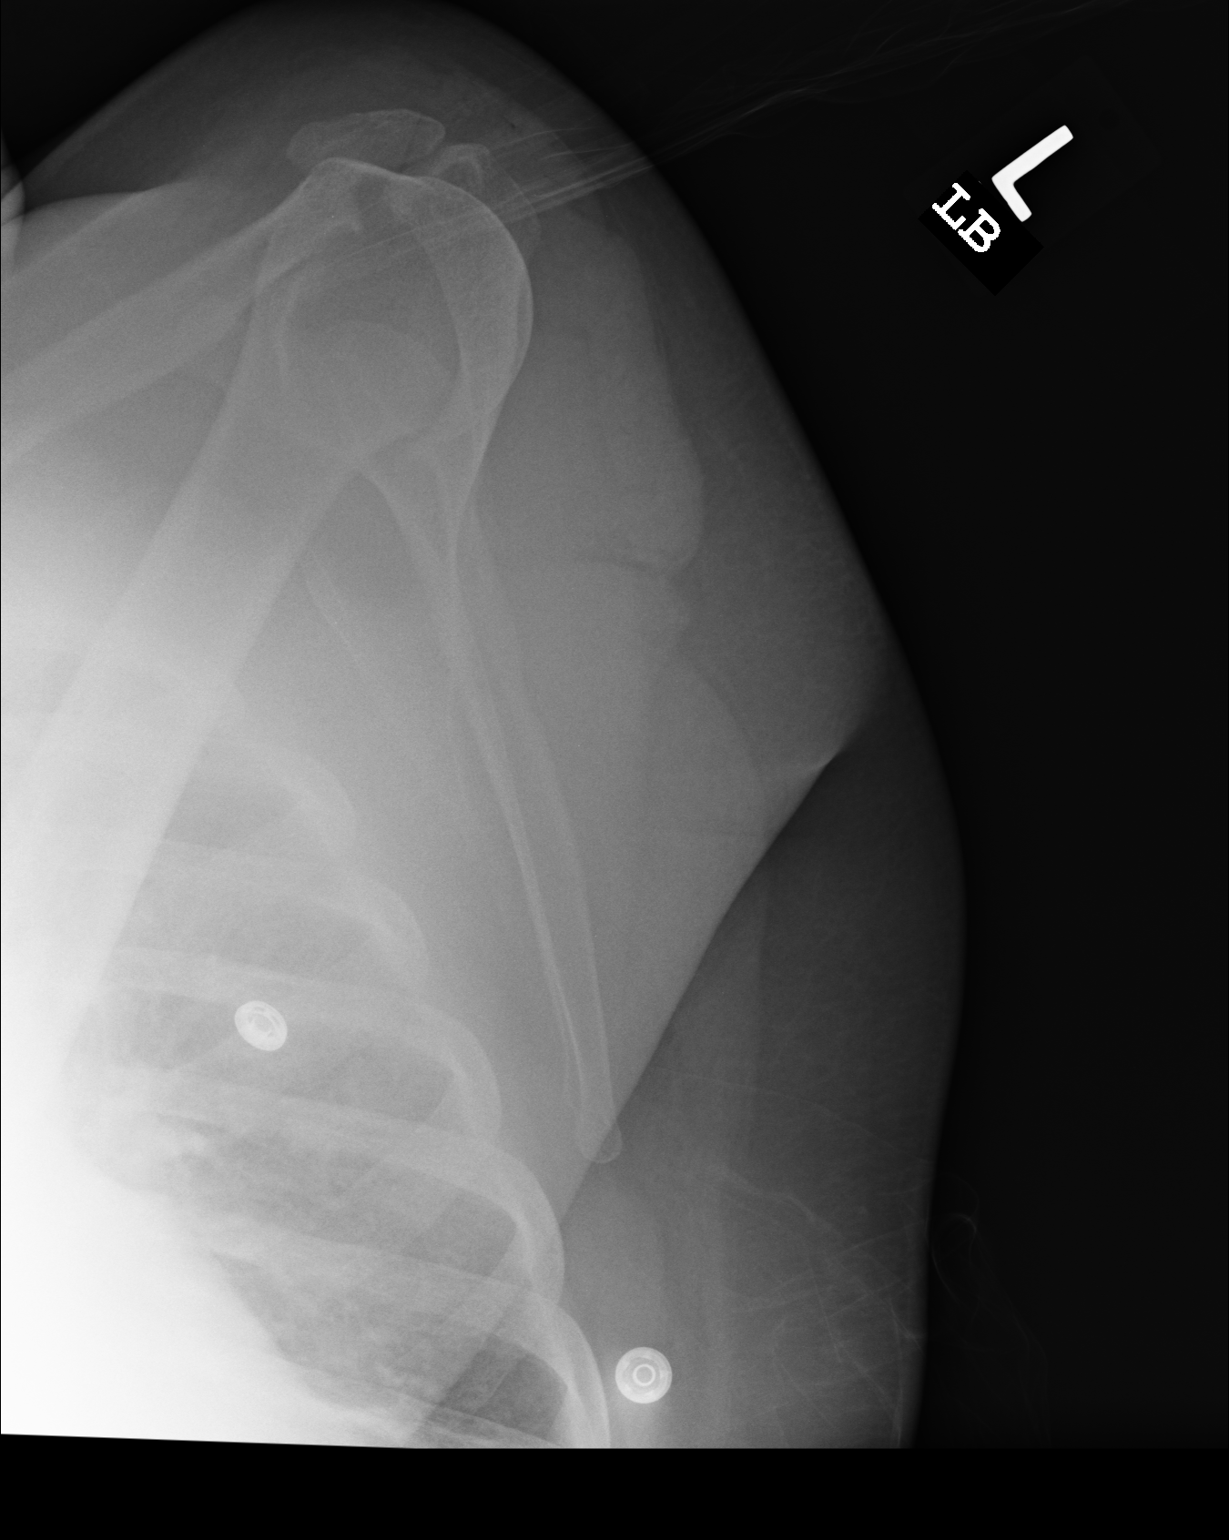

[3 of 3 positions shown; findings below may reference images not displayed]

PROCEDURE:     DXR - DXR SHOULDER LEFT COMPLETE  - January 30, 2010 [DATE]

RESULT:     No fracture, dislocation or other acute bony abnormality about
the shoulder joint is seen. There is deformity at the left acromioclavicular
joint. The change does not appear acute and is likely secondary to residual
change from prior trauma.
IMPRESSION: 1.     No acute bony abnormalities are identified.

## 2010-06-24 ENCOUNTER — Inpatient Hospital Stay: Payer: Self-pay | Admitting: Internal Medicine

## 2010-06-26 ENCOUNTER — Institutional Professional Consult (permissible substitution): Admission: EM | Admit: 2010-06-26 | Discharge: 2010-07-02 | Payer: Self-pay | Admitting: Internal Medicine

## 2010-07-03 ENCOUNTER — Inpatient Hospital Stay: Payer: Self-pay | Admitting: Internal Medicine

## 2010-07-08 ENCOUNTER — Emergency Department: Payer: Self-pay | Admitting: Internal Medicine

## 2010-07-22 IMAGING — CR DG CHEST 1V PORT
1 series · 1 of 1 positions shown · non-contrast
Comparison: none

REASON FOR EXAM: abd pain
COMMENTS:

PROCEDURE:     DXR - DXR PORTABLE CHEST SINGLE VIEW  - March 16, 2010 [DATE]
RESULT:     Comparison: None

[view not recorded]
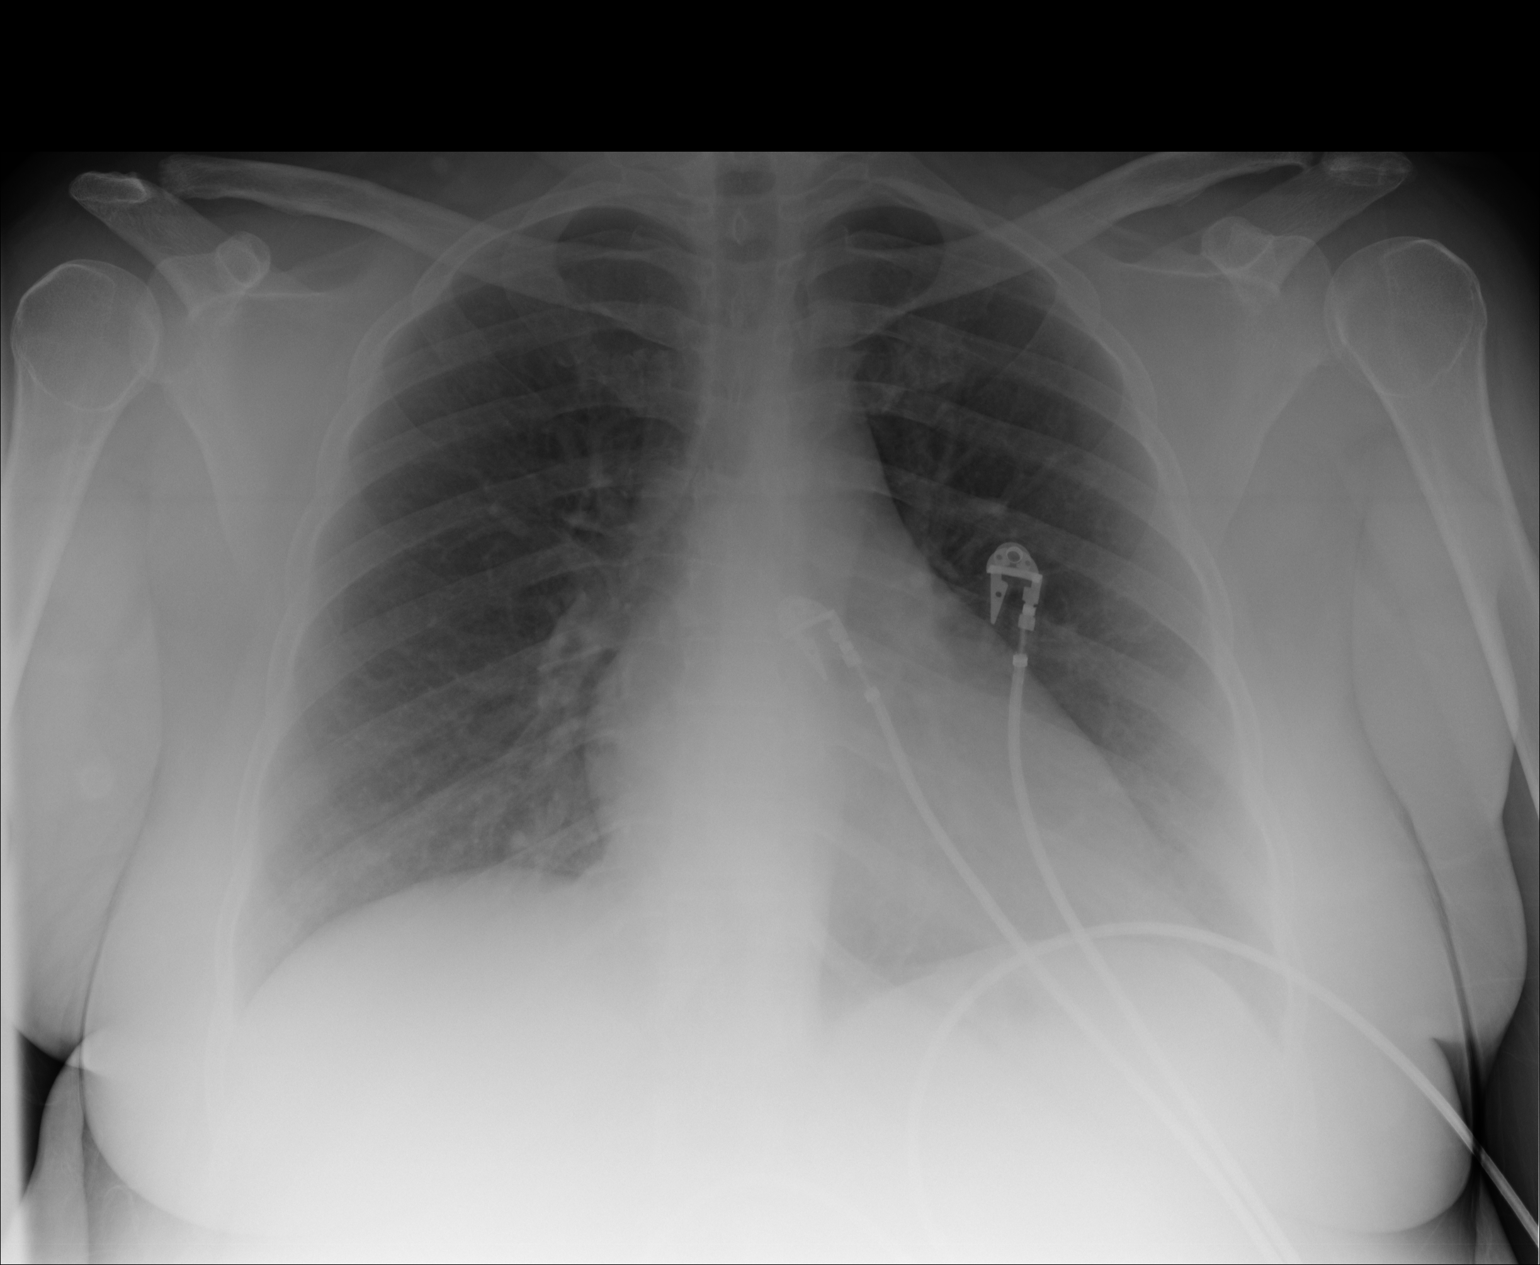

[1 of 1 positions shown; findings below may reference images not displayed]

FINDINGS: Single portable AP chest radiograph is provided. There is no focal
parenchymal opacity, pleural effusion, or pneumothorax. Normal
cardiomediastinal silhouette. The osseous structures are unremarkable.
IMPRESSION: No acute disease of the chest.

## 2010-09-04 IMAGING — CR DG ABDOMEN 3V
1 series · 4 of 4 positions shown · non-contrast
Comparison: none

REASON FOR EXAM: vomiting
COMMENTS:

[Series 1: view not recorded · 0.17mm/px · 4 of 4 slices shown]
[im 1/4]
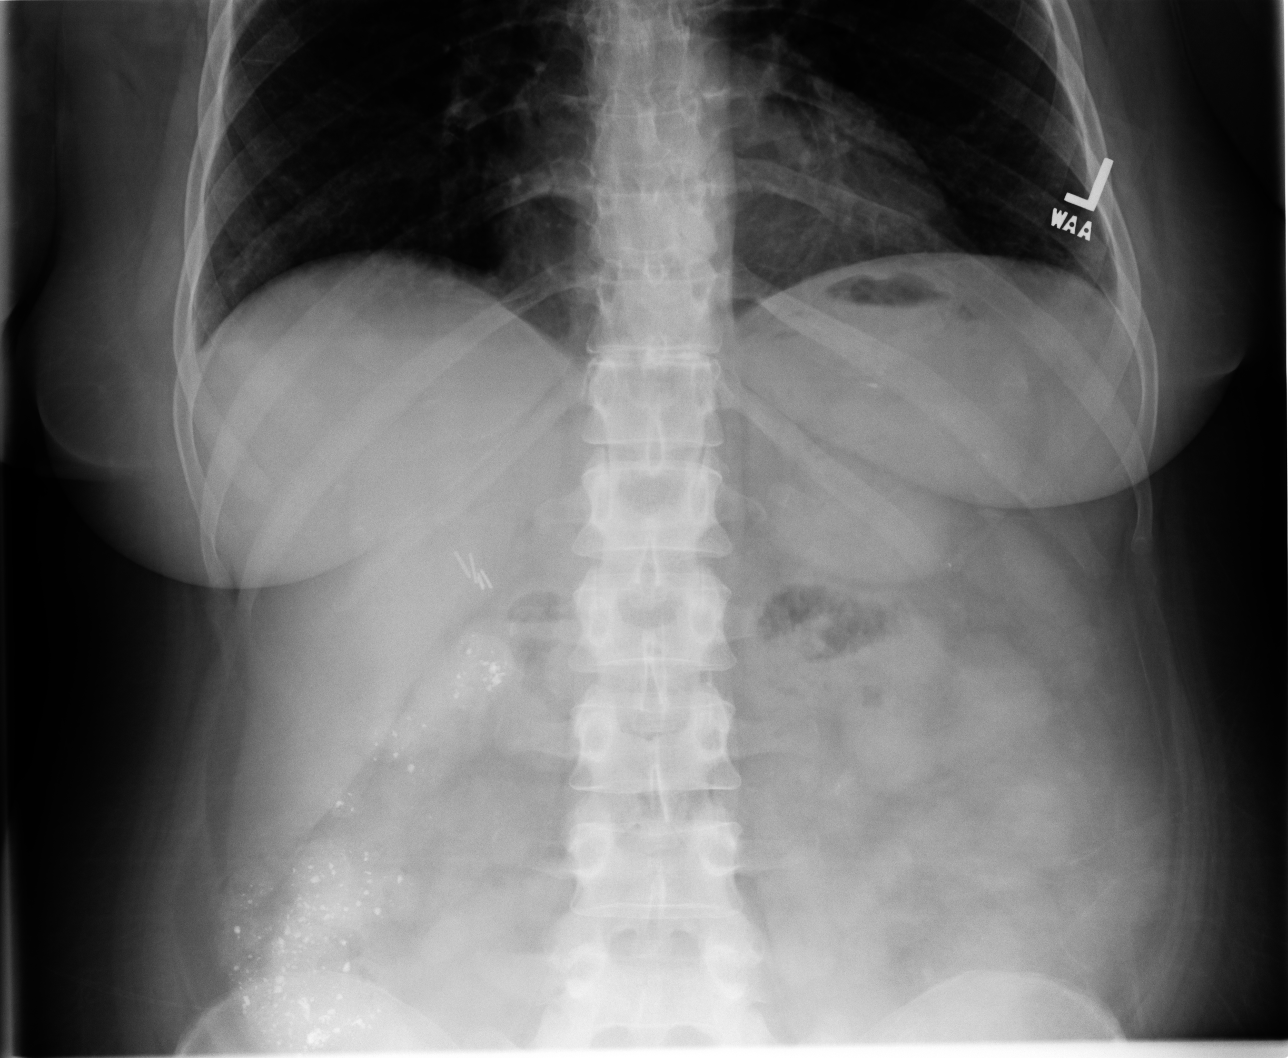
[im 2/4]
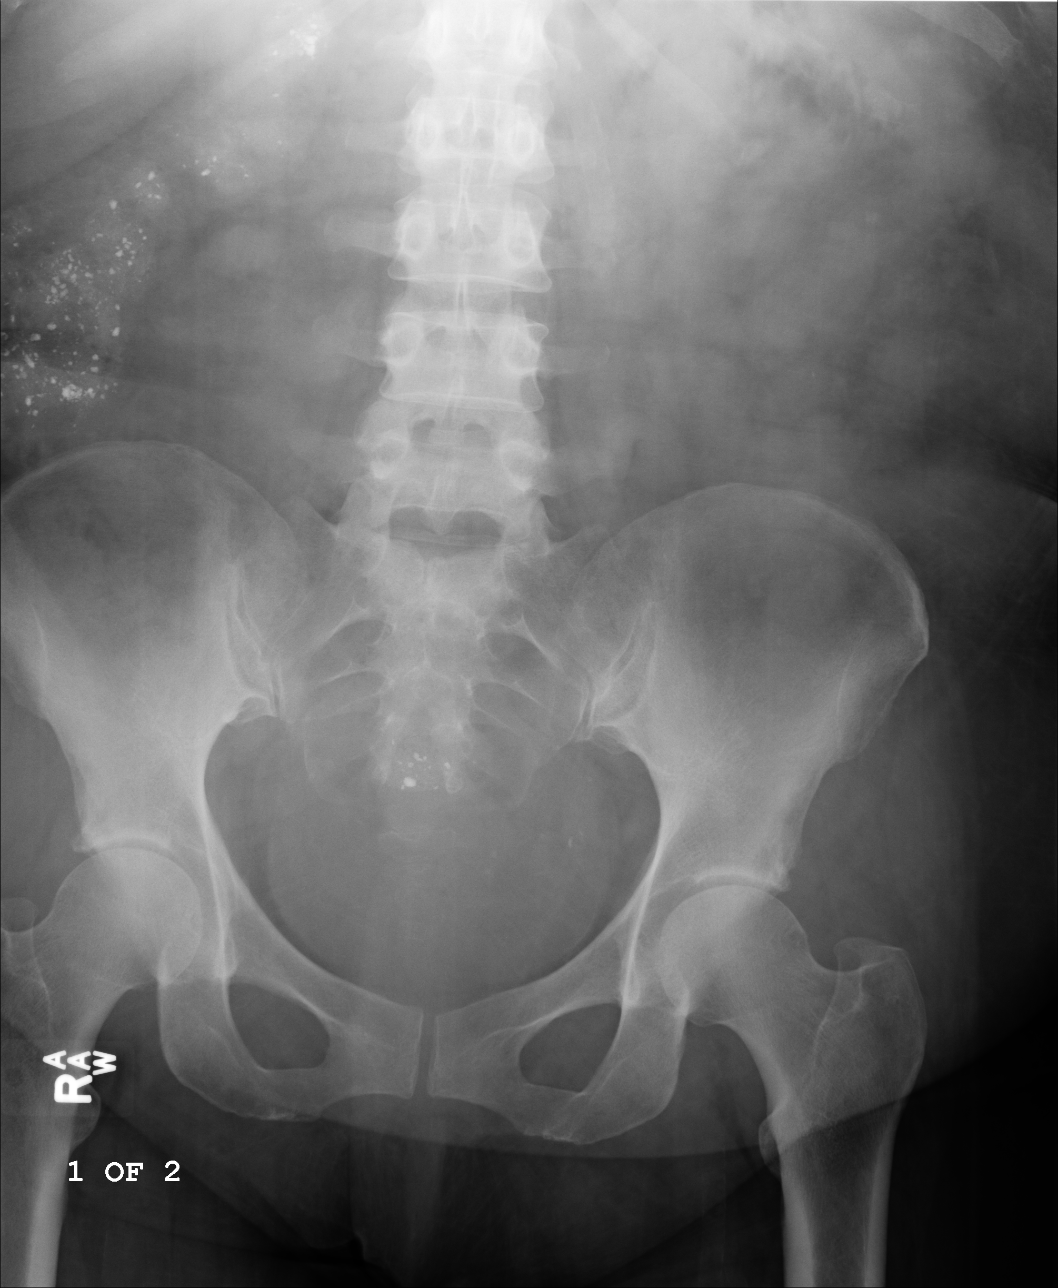
[im 3/4]
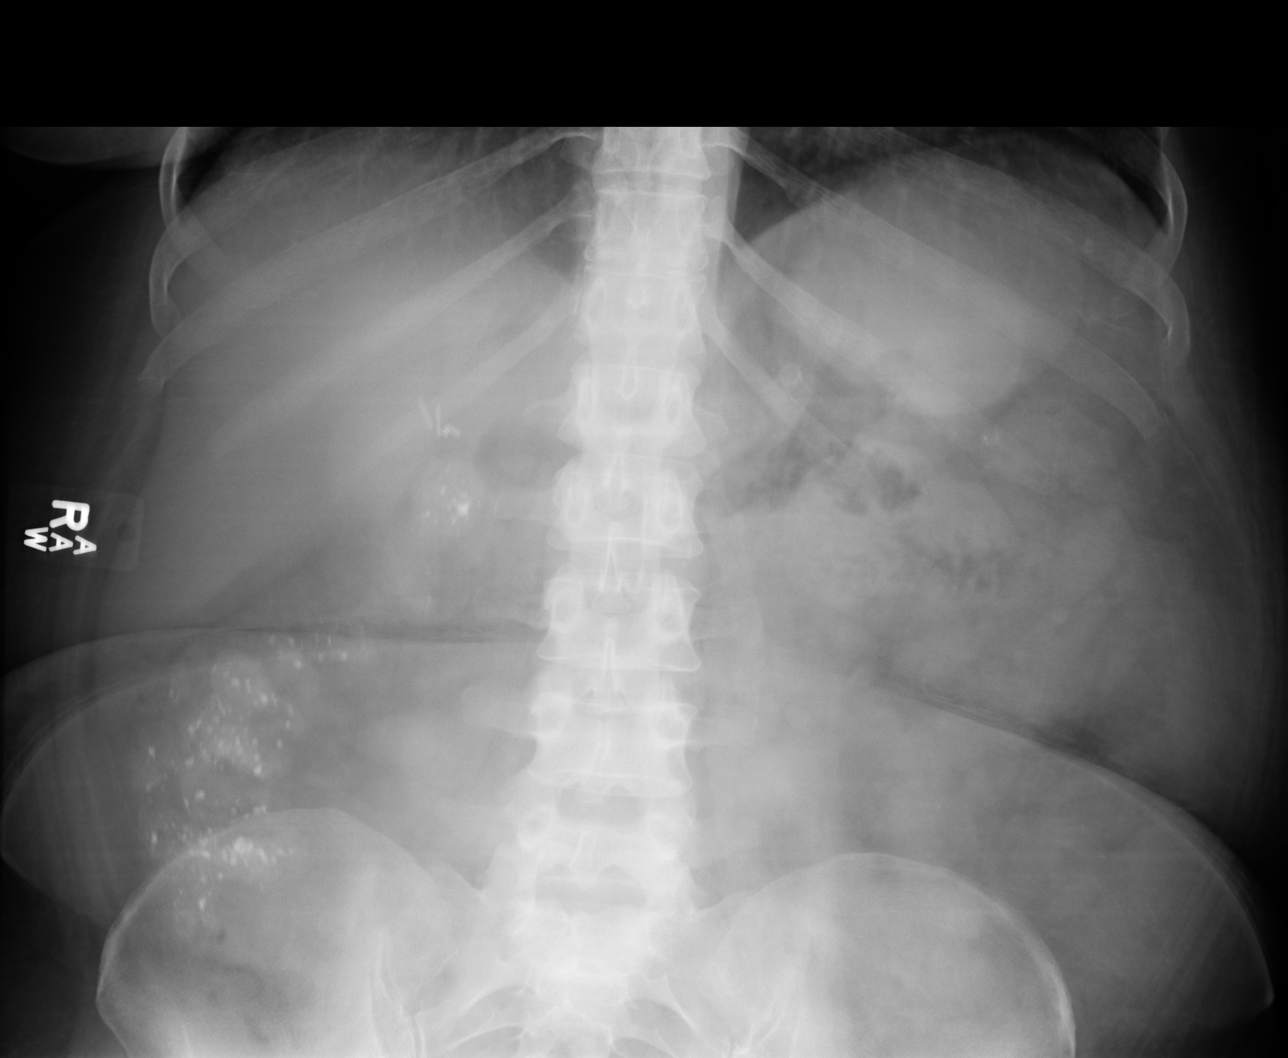
[im 4/4]
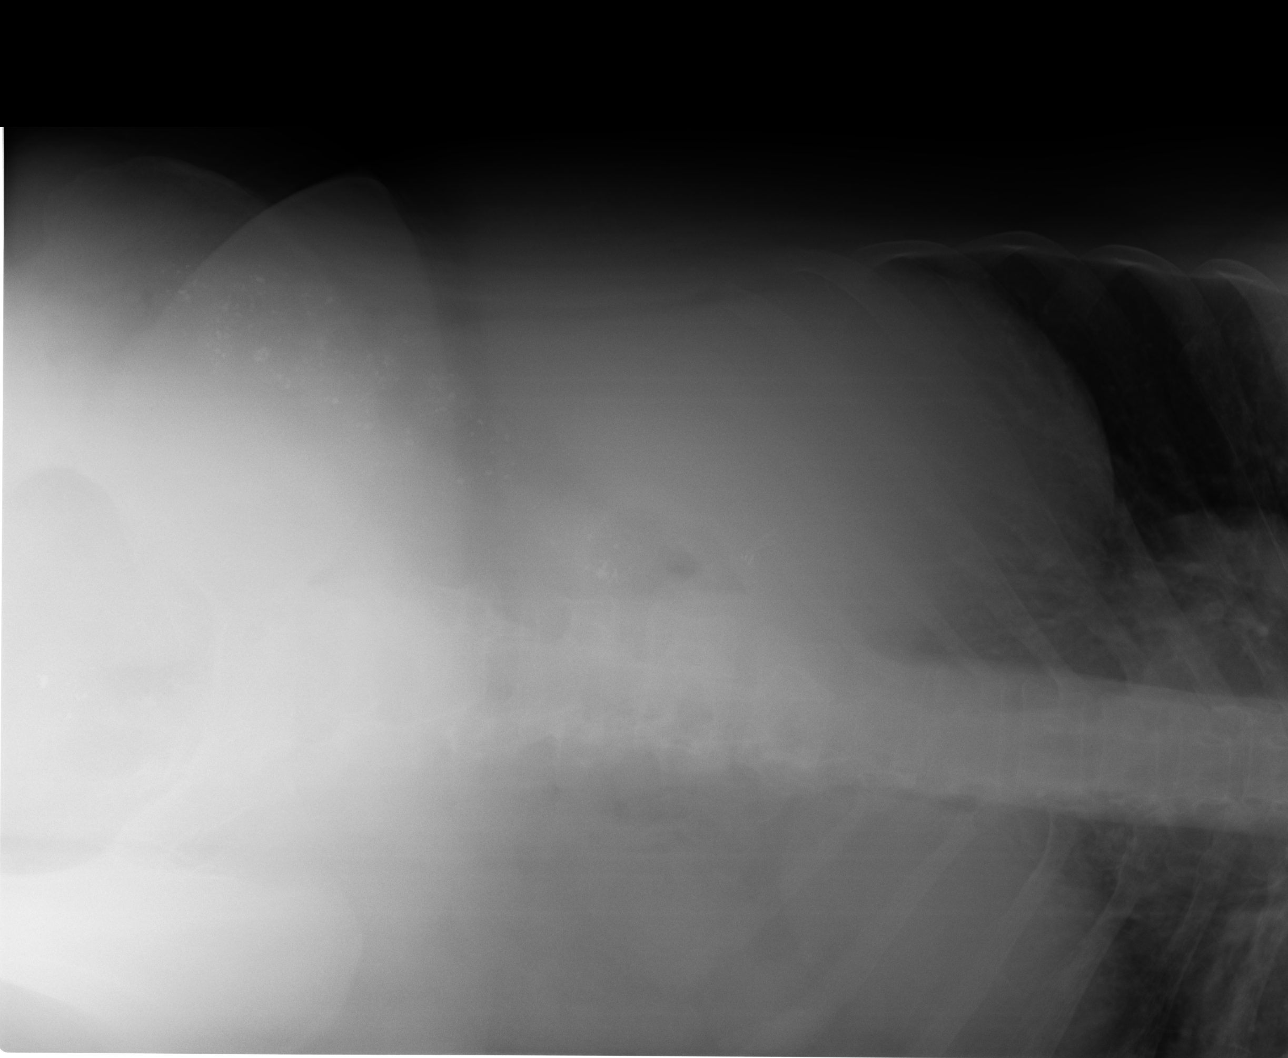

[4 of 4 positions shown; findings below may reference images not displayed]

PROCEDURE:     DXR - DXR ABDOMEN COMPLETE  - April 29, 2010  [DATE]

RESULT:     Comparison is made to images of 10/05/2009.

Punctate densities are seen in the colon which may be from ingested
material. The bowel gas pattern is unremarkable. No abnormal distention or
perforation is evident. Cholecystectomy clips are present. No definite
urinary tract stones are evident.
IMPRESSION: Please see above.

## 2010-09-17 IMAGING — CR DG ABDOMEN 3V
1 series · 5 of 5 positions shown · non-contrast
Comparison: none

REASON FOR EXAM: Pain
COMMENTS:   May transport without cardiac monitor

[Series 1: view not recorded · 0.17mm/px · 5 of 5 slices shown]
[im 1/5]
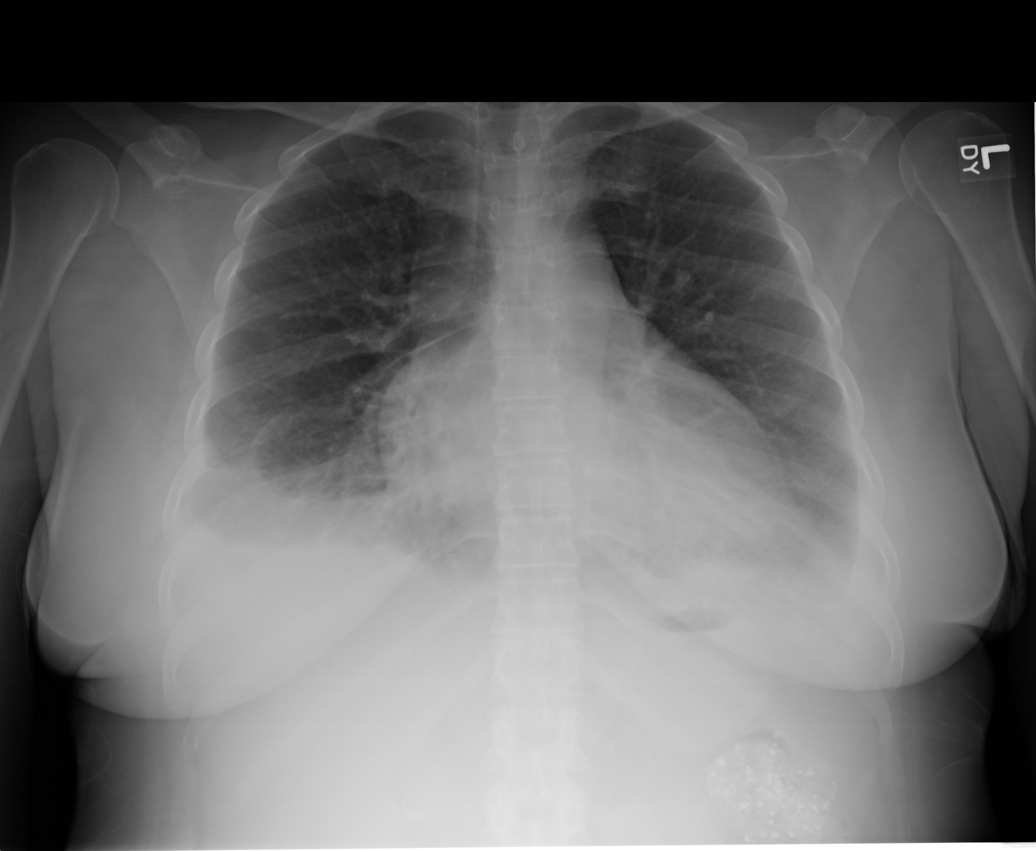
[im 2/5]
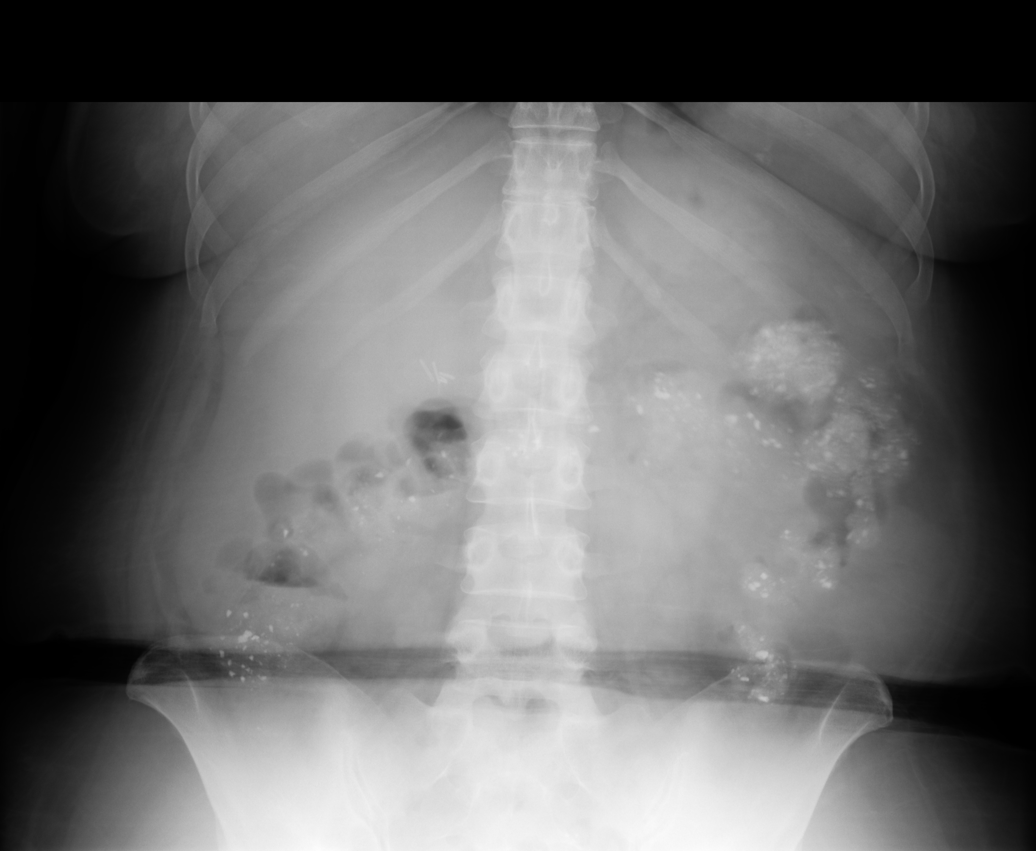
[im 3/5]
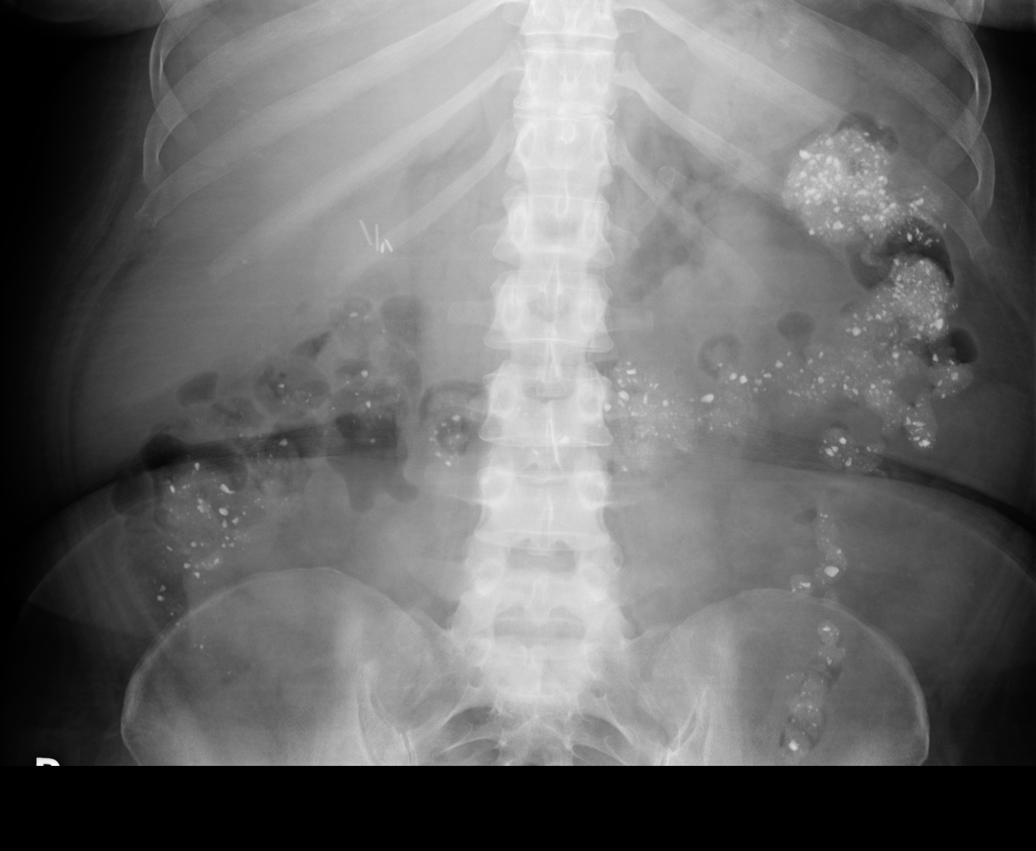
[im 4/5]
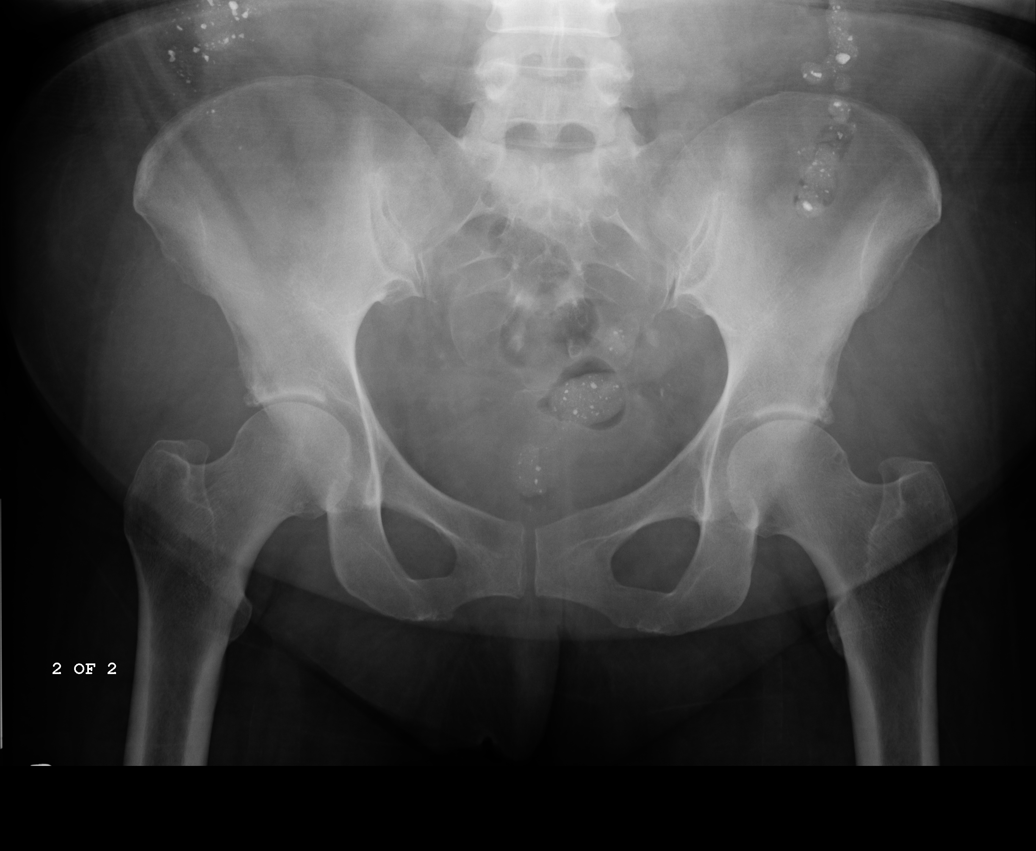
[im 5/5]
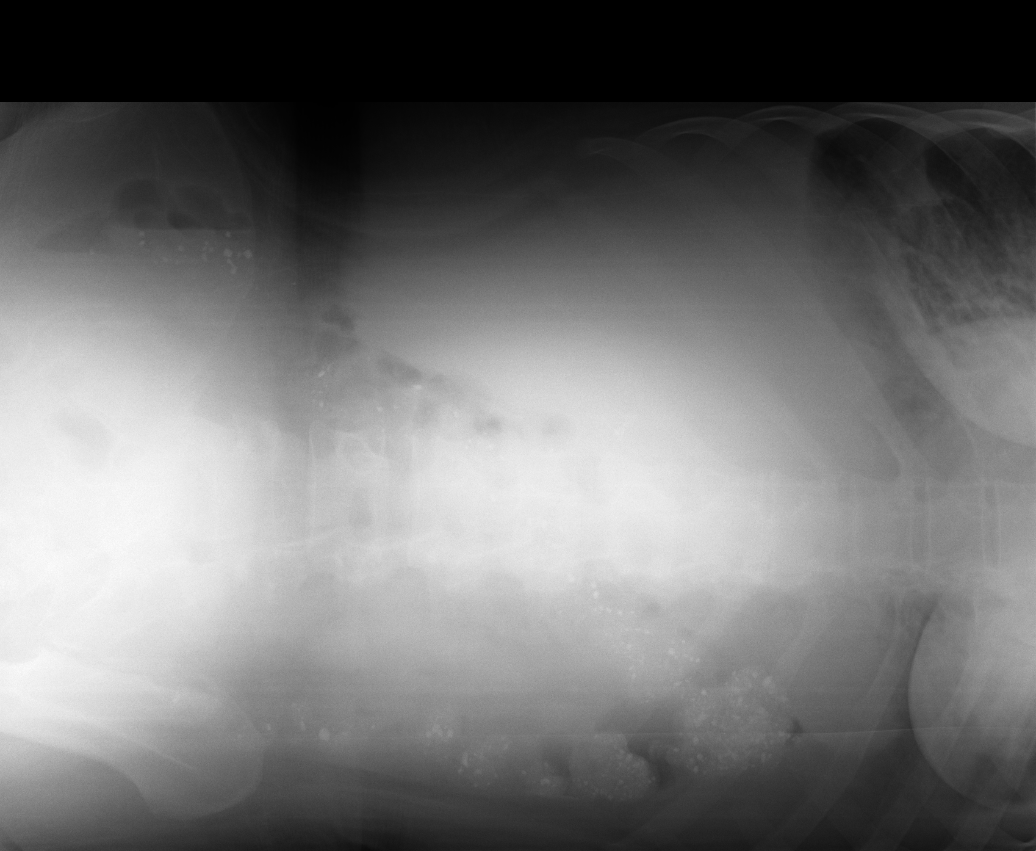

[5 of 5 positions shown; findings below may reference images not displayed]

PROCEDURE:     DXR - DXR ABDOMEN 3-WAY (INCL PA CXR)  - May 12, 2010  [DATE]

RESULT:

Comparison is made to a prior study dated 05/02/2010.

Frontal view of the chest demonstrates small, bilateral pleural effusions,
right greater than left. There is thickening of the interstitial markings
and peribronchial cuffing. The cardiac silhouette is enlarged indicative of
cardiomegaly. The visualized bony skeleton is unremarkable. Air is seen
within nondilated loops or large and small bowel. Flocculated barium is
appreciated throughout the colon. There is no evidence of free air. The
visualized bony skeleton is unremarkable.
IMPRESSION: 1.  Bilateral effusions.
2.  Pulmonary vascular congestion versus nonedematous interstitial
infiltrate.
3.  Nonobstructive bowel gas pattern with residual flocculated barium.

## 2010-09-18 IMAGING — CR DG ABDOMEN 3V
1 series · 4 of 4 positions shown · non-contrast
Comparison: none

REASON FOR EXAM: abd pain
COMMENTS:

[Series 1: view not recorded · 0.17mm/px · 4 of 4 slices shown]
[im 1/4]
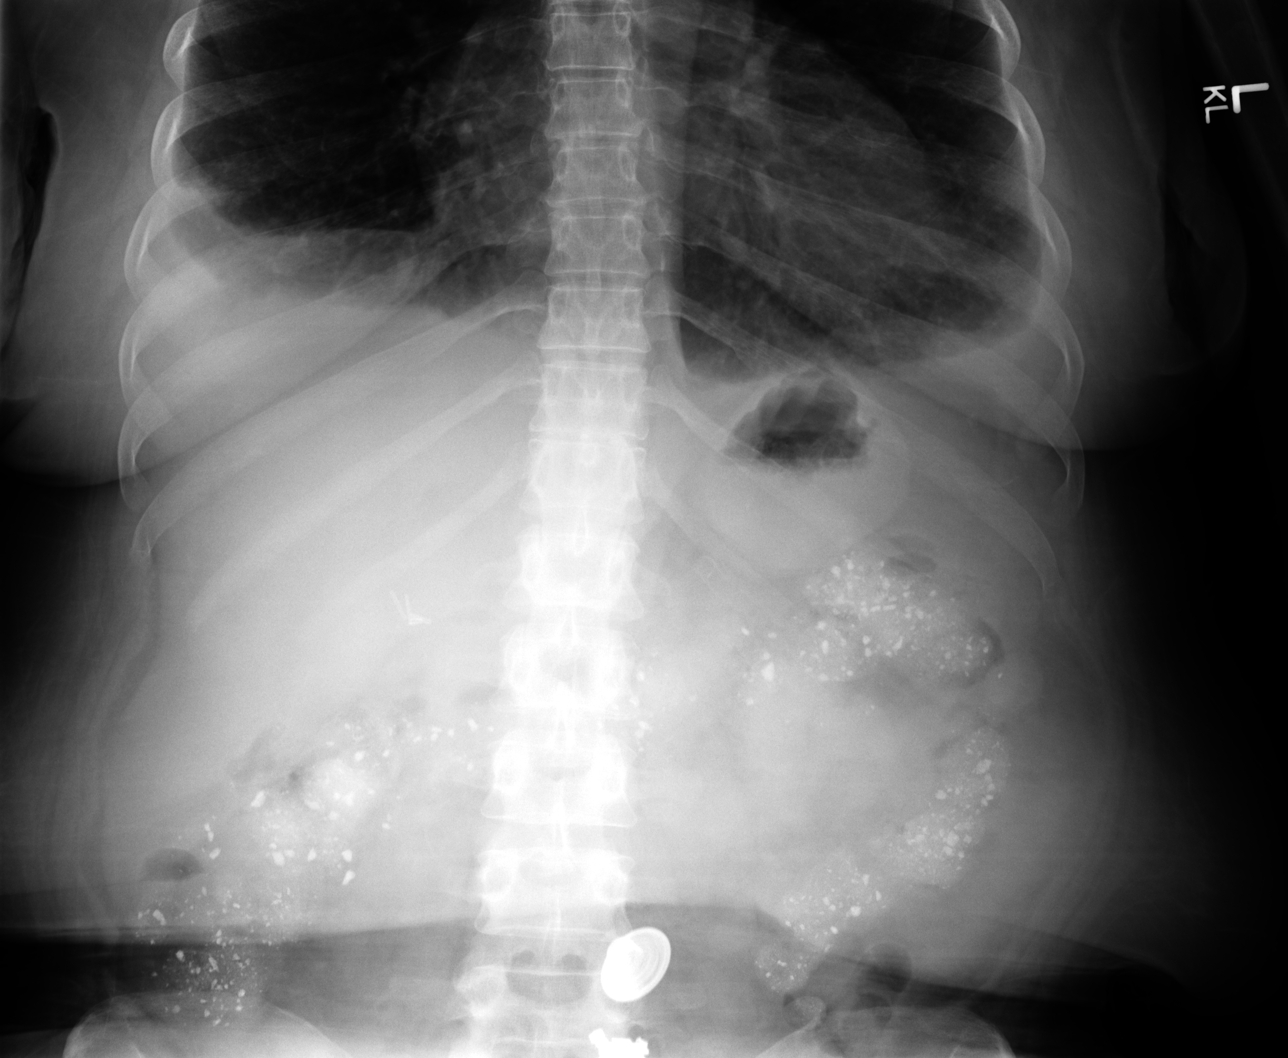
[im 2/4]
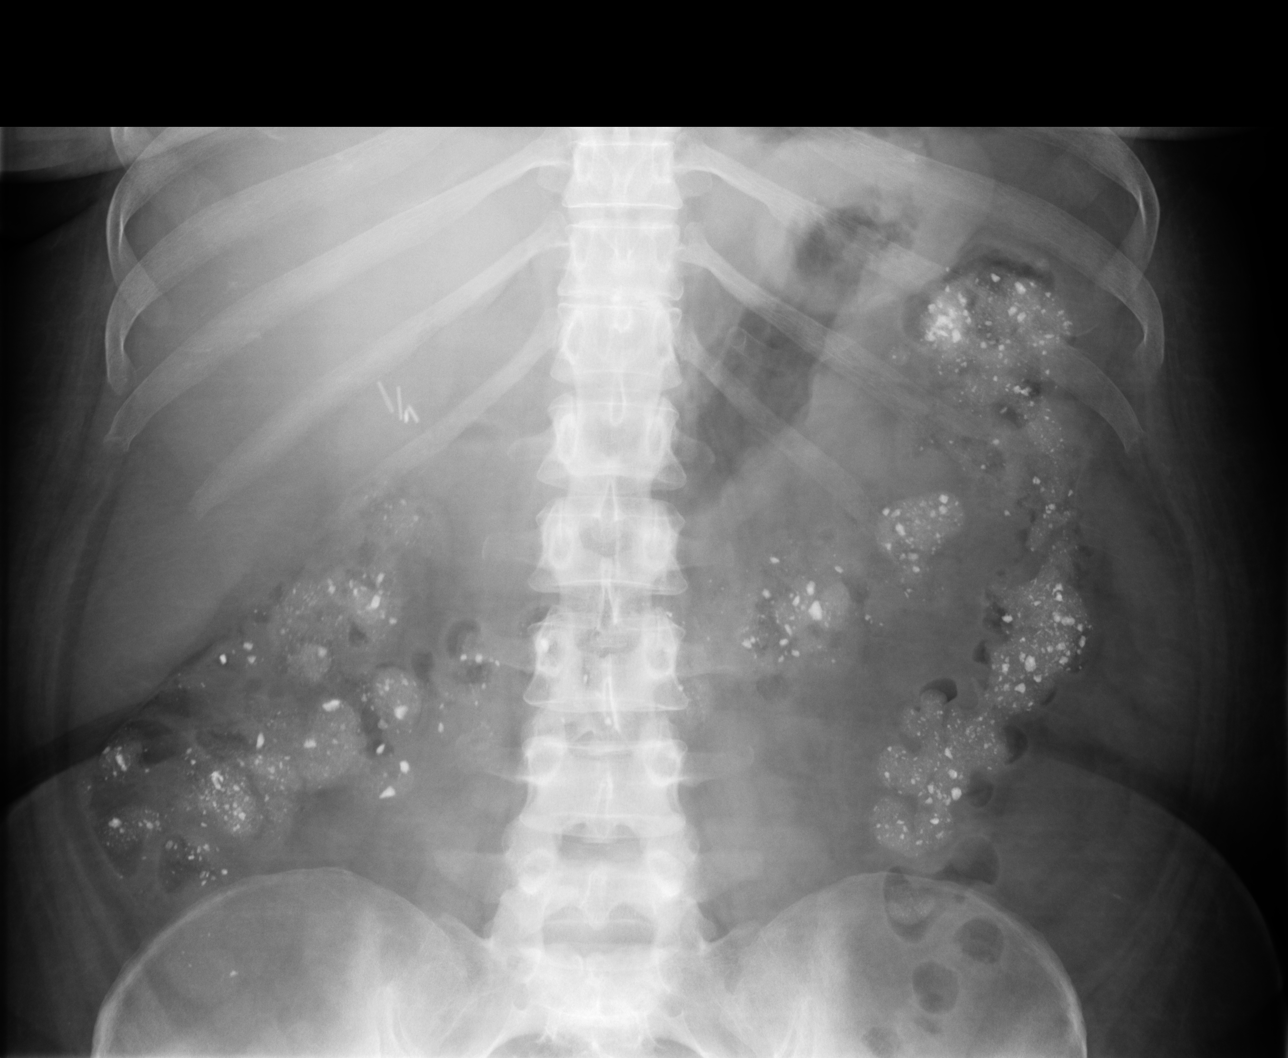
[im 3/4]
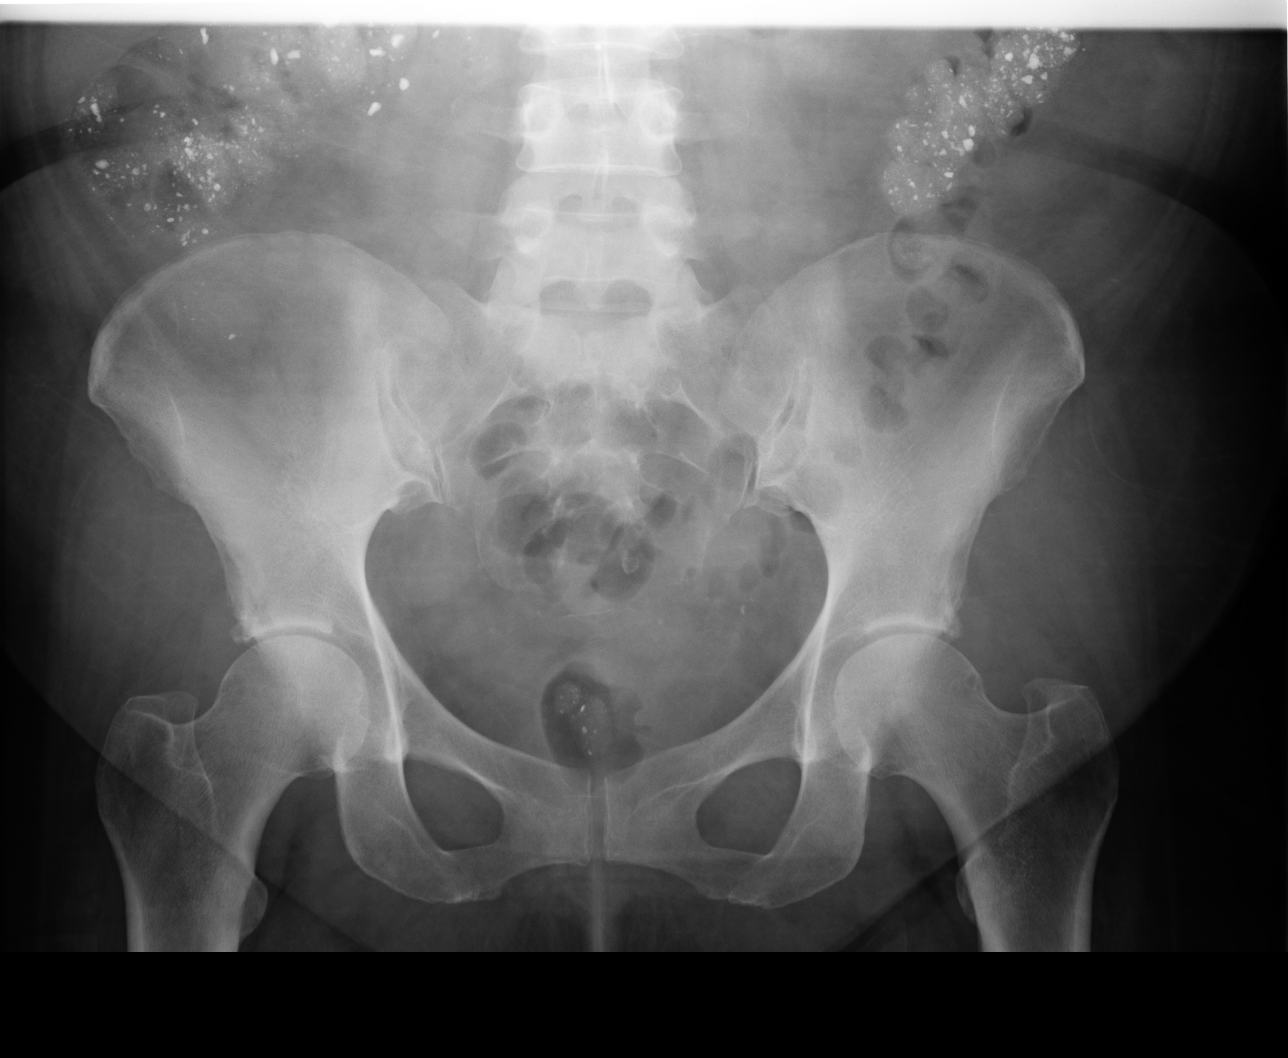
[im 4/4]
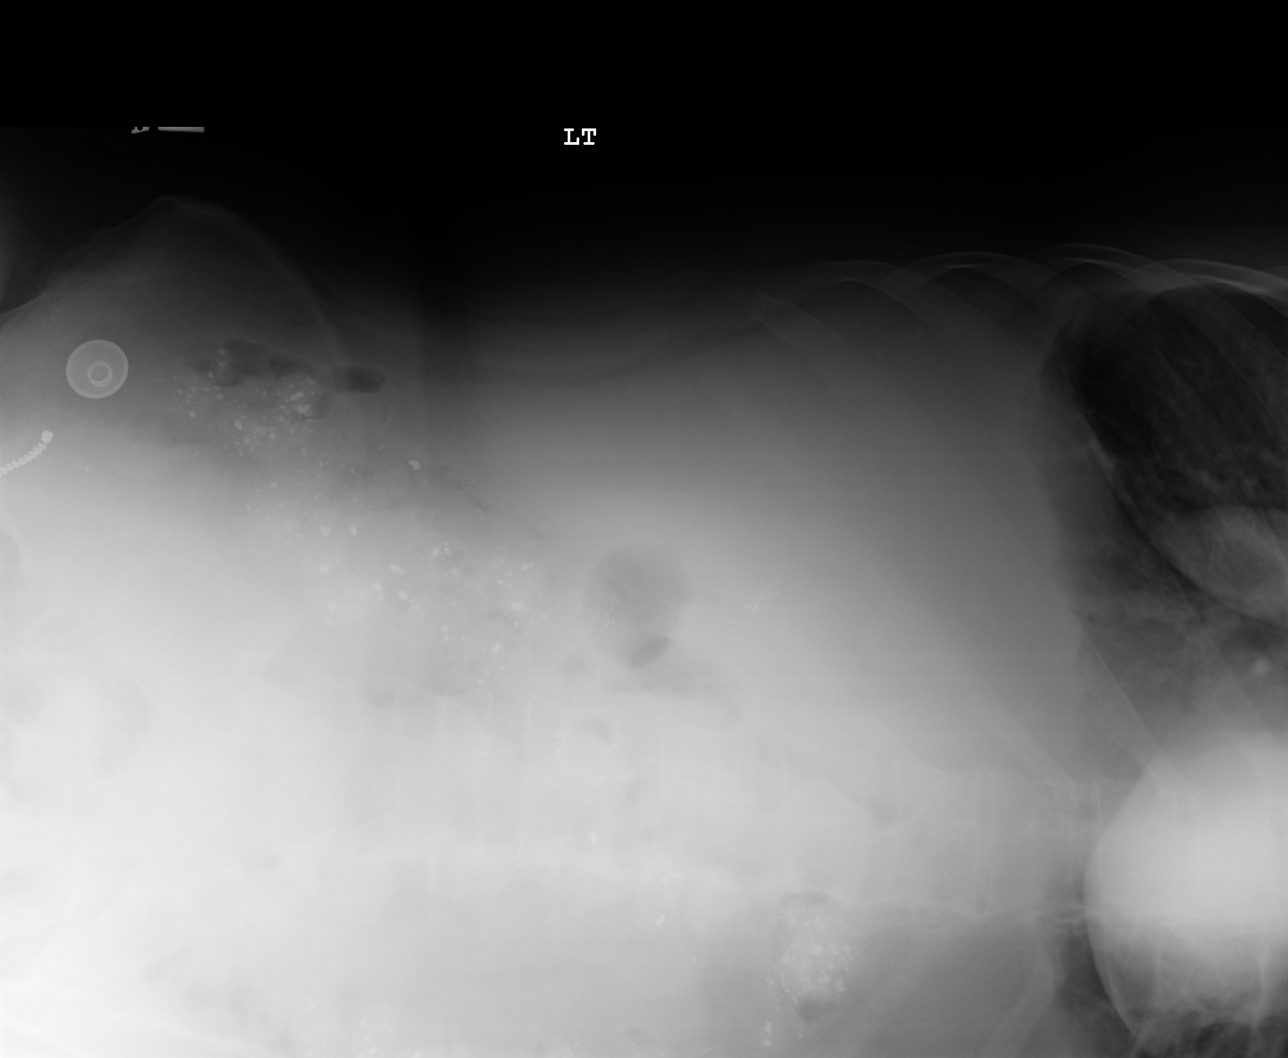

[4 of 4 positions shown; findings below may reference images not displayed]

PROCEDURE:     DXR - DXR ABDOMEN COMPLETE  - May 13, 2010  [DATE]

RESULT:     Frontal view of the abdomen is performed. Comparison is made to
a prior study dated 05/12/2010.

Air is seen within nondilated loops of large and small bowel. Flocculated
barium is identified throughout the colon. A moderate amount of stool is
appreciated. The visualized bony skeleton is unremarkable.
IMPRESSION: Nonobstructive bowel gas pattern.

## 2010-09-18 IMAGING — CR DG CHEST 1V PORT
1 series · 2 of 2 positions shown · non-contrast
Comparison: none

REASON FOR EXAM: eval effusion
COMMENTS:

[Series 1: view not recorded · 0.17mm/px · 2 of 2 slices shown]
[im 1/2]
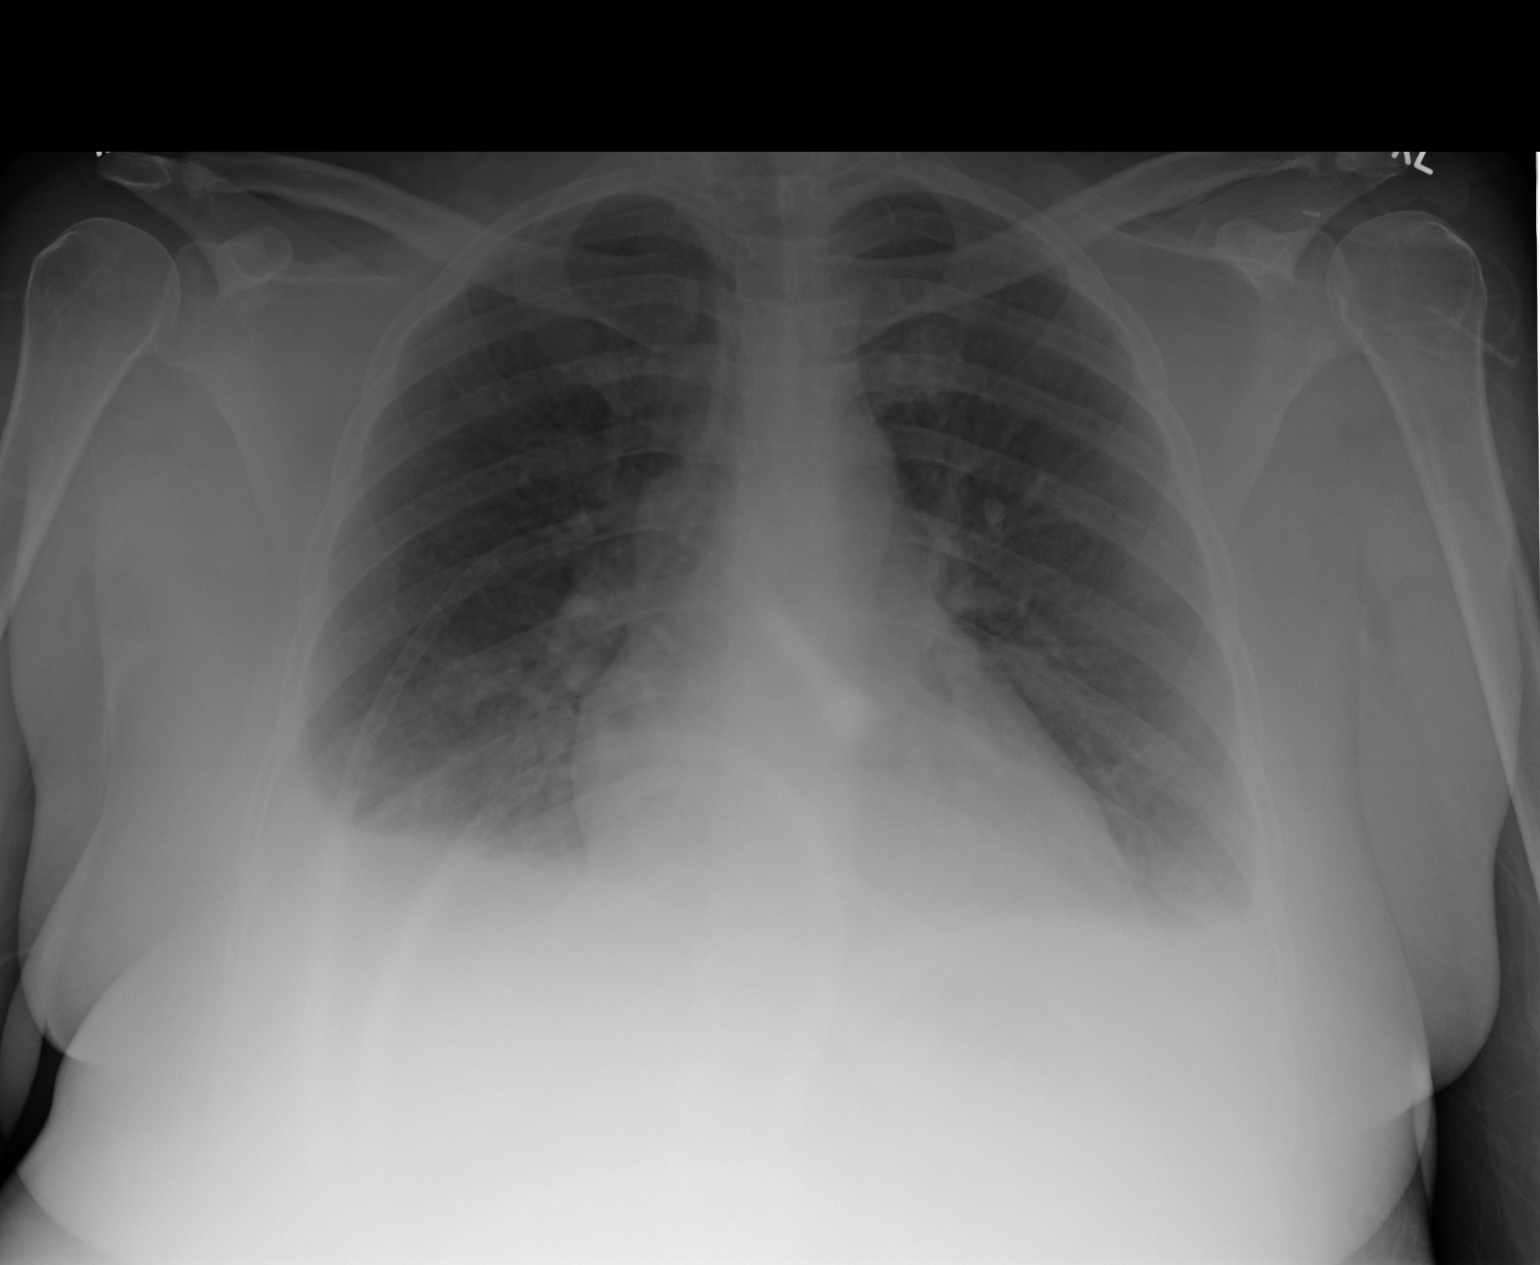
[im 2/2]
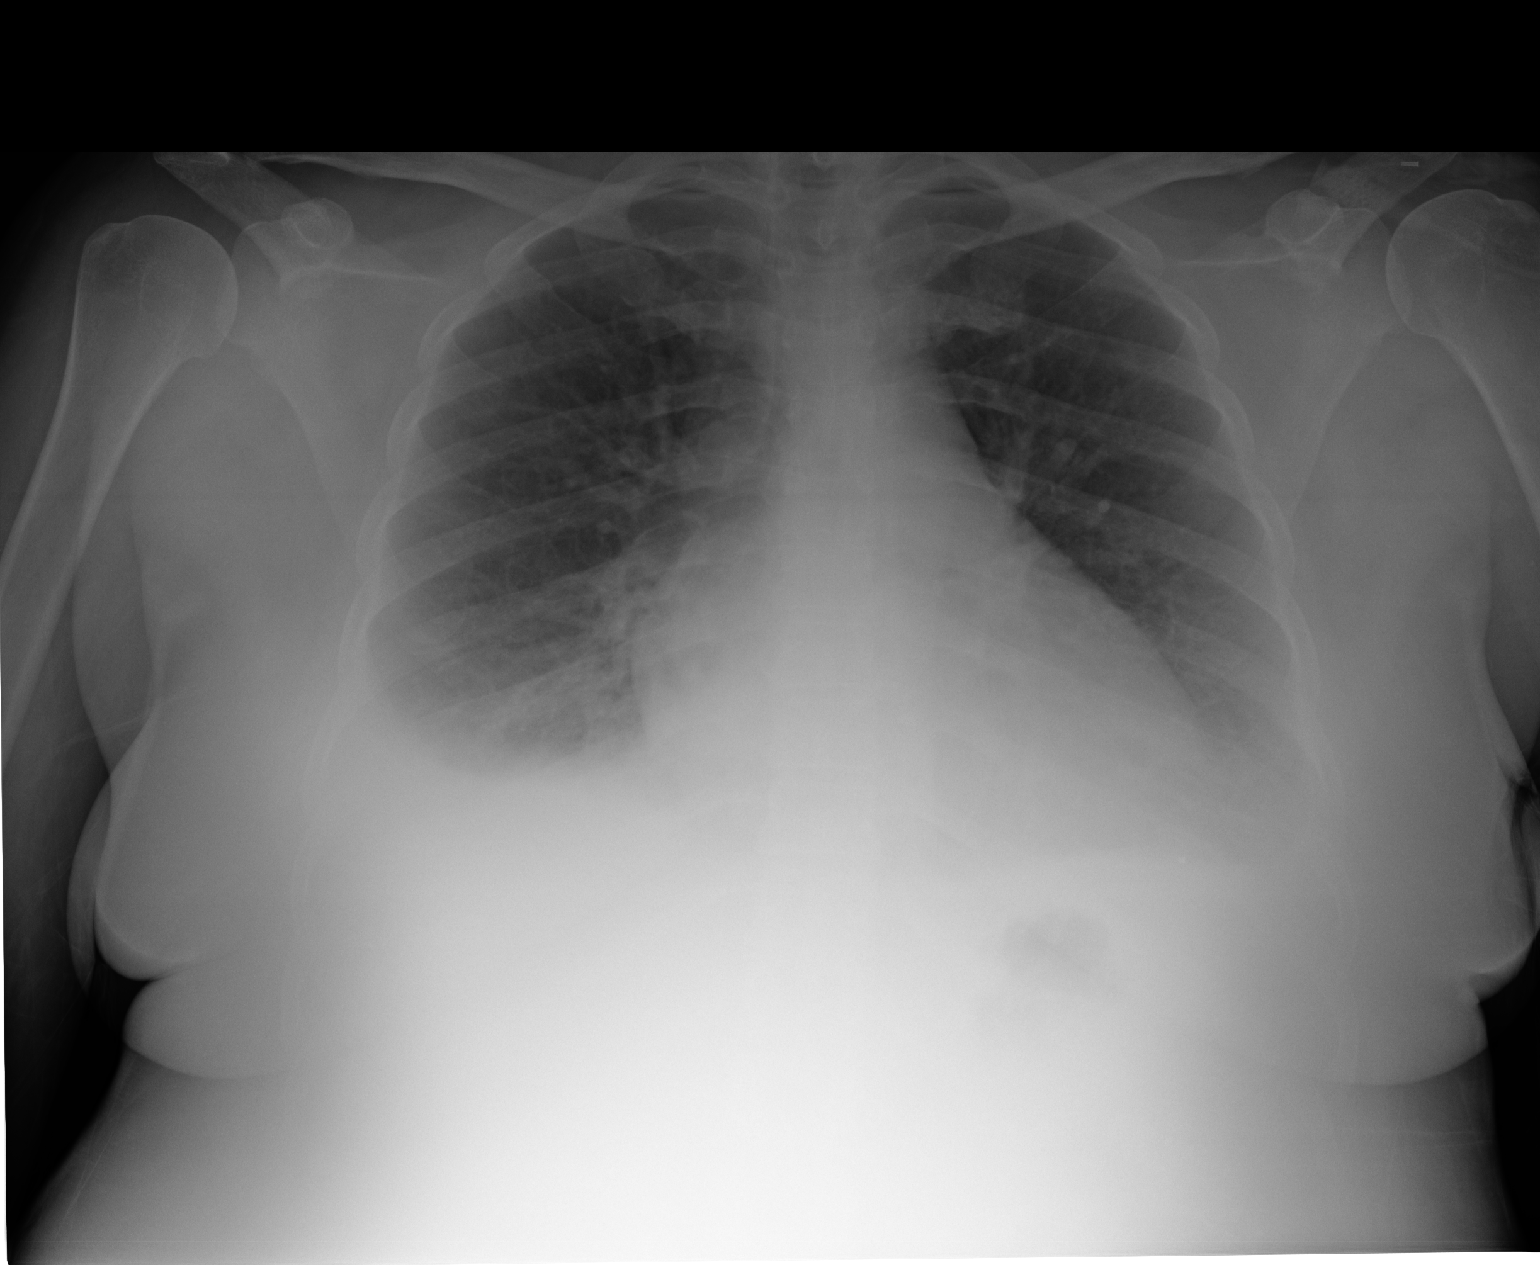

[2 of 2 positions shown; findings below may reference images not displayed]

PROCEDURE:     DXR - DXR PORTABLE CHEST SINGLE VIEW  - May 13, 2010  [DATE]

RESULT:     Frontal view of the chest is obtained. Comparison is made to a
prior study dated 03/24/2010.

There has been interval development of a small, right pleural effusion.
Thickening of the interstitial markings is appreciated as well as mild
peribronchial cuffing. Blunting of the left costophrenic angle is
identified. The cardiac silhouette is within normal limits. The visualized
bony skeleton is unremarkable.
IMPRESSION: 1.  Small, bilateral pleural effusions, right greater than left.
2.  Interstitial infiltrate, edematous versus nonedematous.

## 2010-09-19 IMAGING — CR DG CHEST 2V
1 series · 2 of 2 positions shown · non-contrast
Comparison: none

REASON FOR EXAM: SOB
COMMENTS:

[Series 1: view not recorded · 0.17mm/px · 2 of 2 slices shown]
[im 1/2]
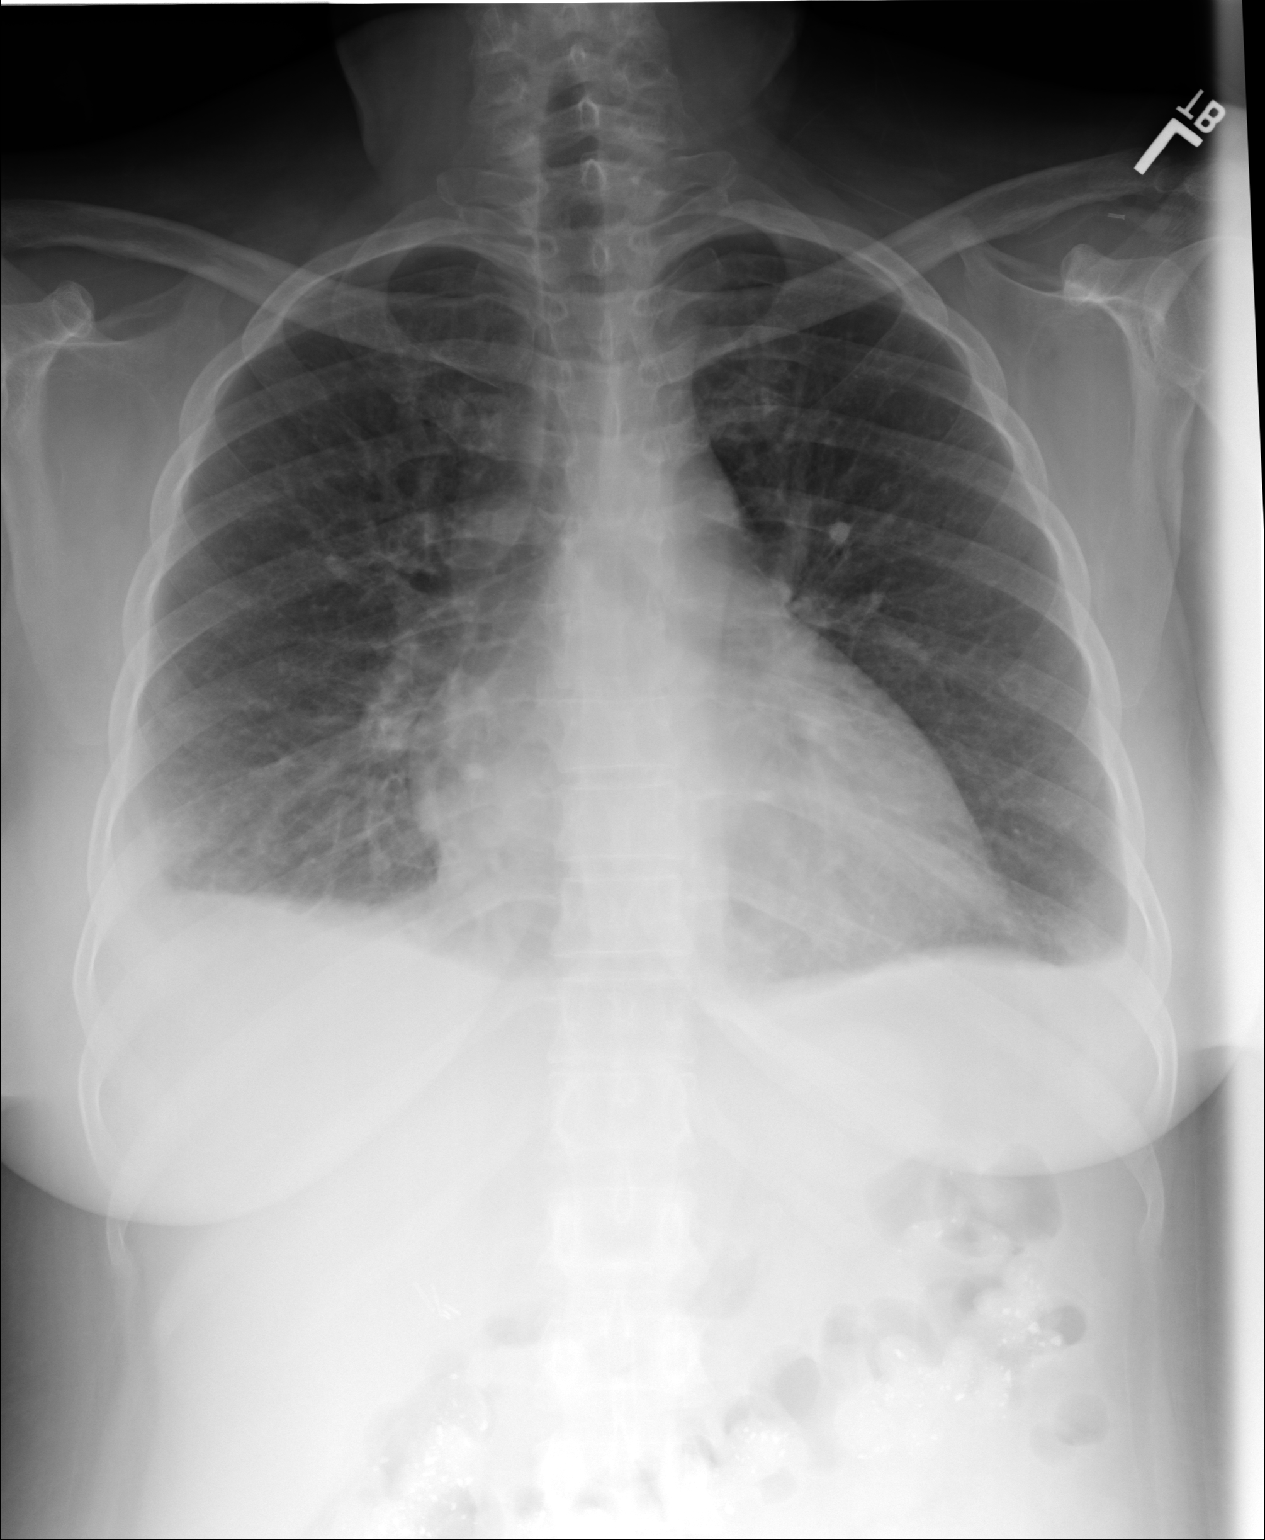
[im 2/2]
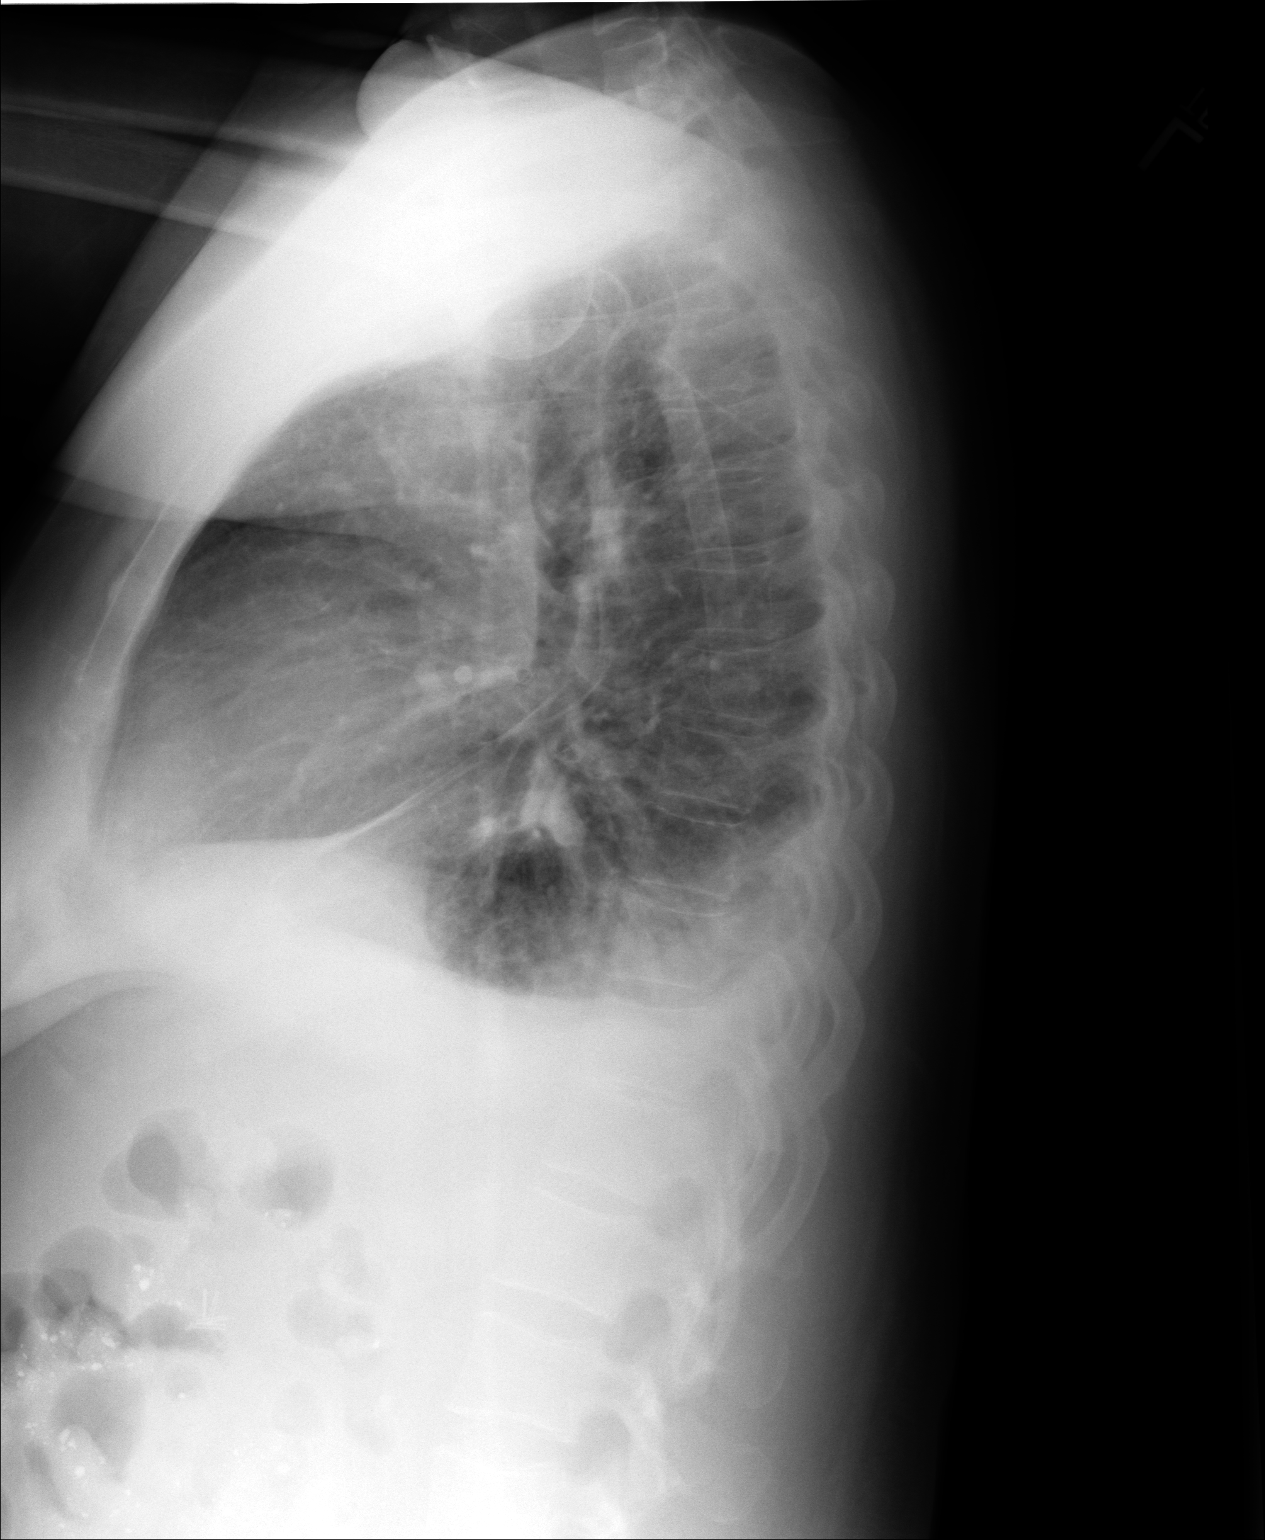

[2 of 2 positions shown; findings below may reference images not displayed]

PROCEDURE:     DXR - DXR CHEST PA (OR AP) AND LATERAL  - May 14, 2010  [DATE]

RESULT:     Comparison is made to the study of 05/13/2010. There is also a
study dated 03/24/2010.

The heart is borderline to mildly enlarged. There is some trace pleural
effusion bilaterally. Minimal basilar atelectasis is present, greater on the
right. No overt pulmonary edema is appreciated. There is no focal infiltrate
or pneumothorax. No discrete mass is evident. A small, hiatal hernia may be
present.
IMPRESSION: 1.     Trace pleural effusions.
2.     Possible small hiatal hernia.

## 2010-09-19 IMAGING — US ABDOMEN ULTRASOUND
1 series · 17 of 25 positions shown · non-contrast
Comparison: none

REASON FOR EXAM: abd. pain
COMMENTS:

[Series 1: abdomen ultrasound · 17 of 49 slices shown]
[im 1/49]
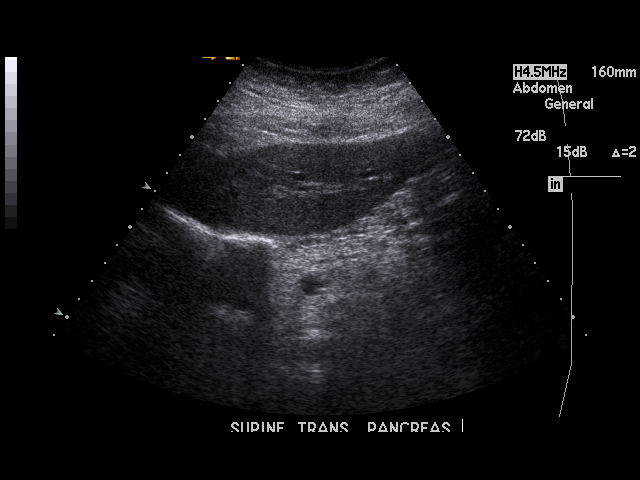
[im 5/49]
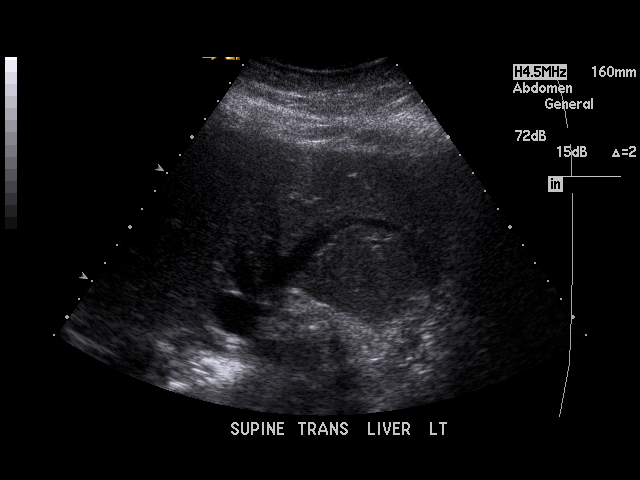
[im 7/49]
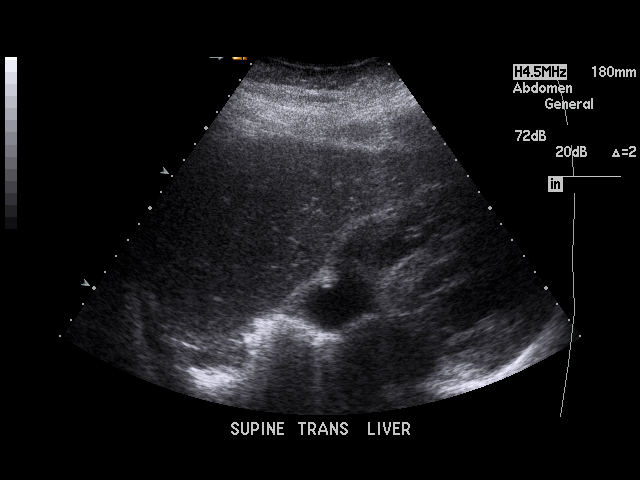
[im 11/49]
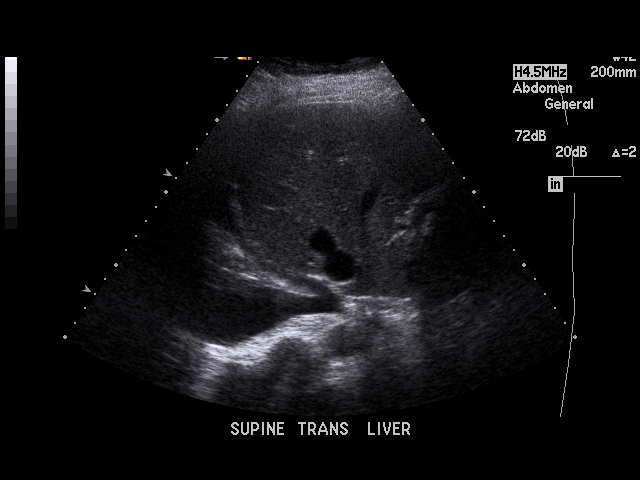
[im 13/49]
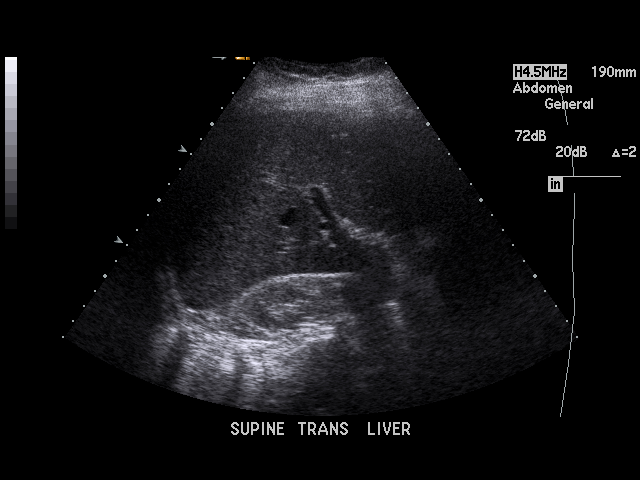
[im 17/49]
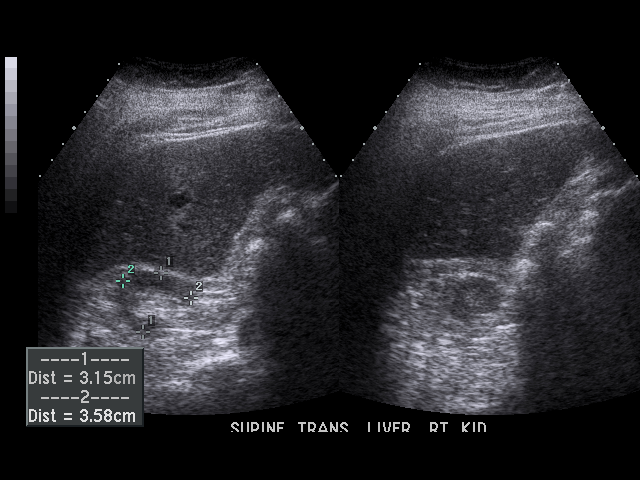
[im 19/49]
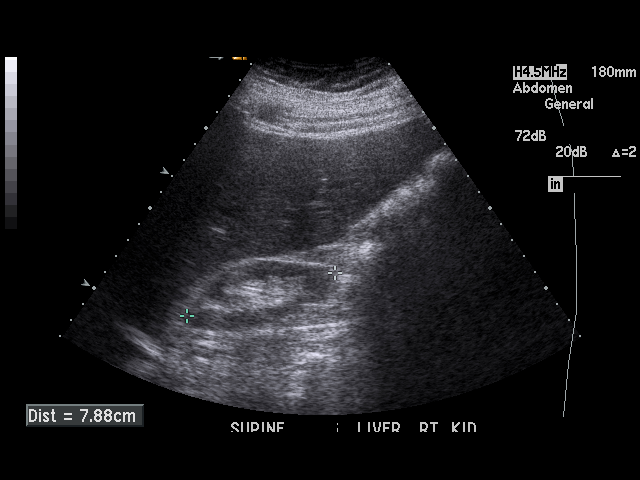
[im 23/49]
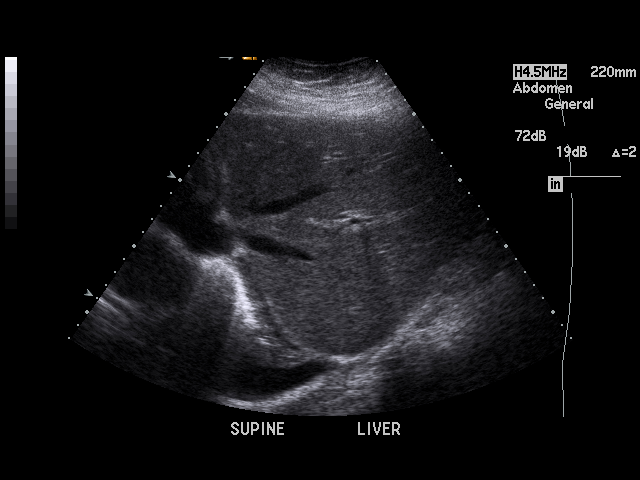
[im 25/49]
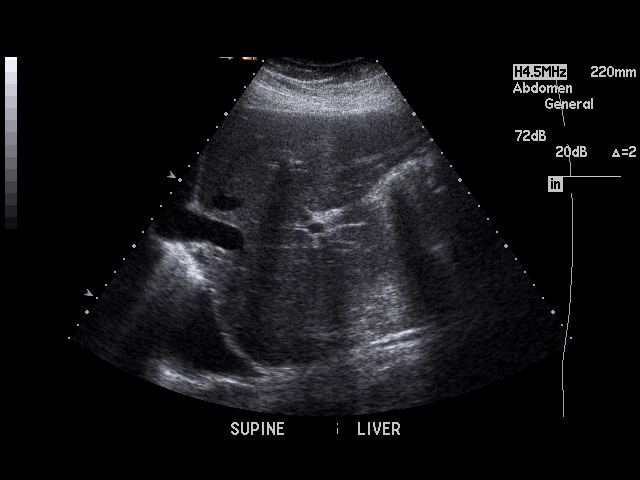
[im 27/49]
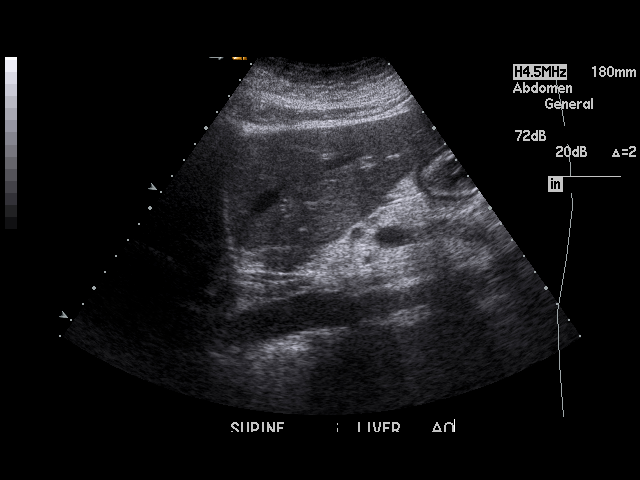
[im 31/49]
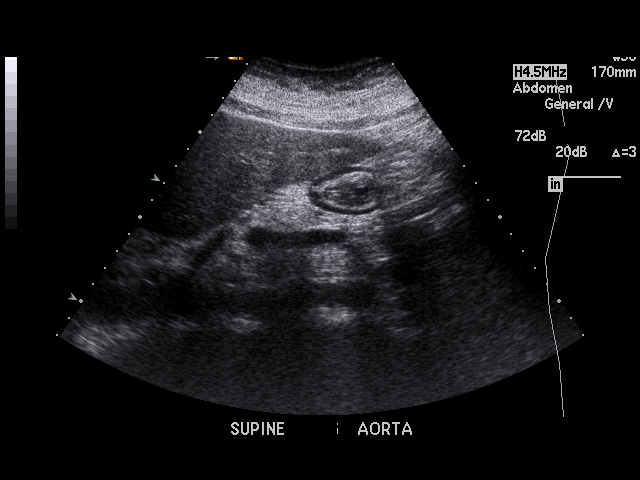
[im 33/49]
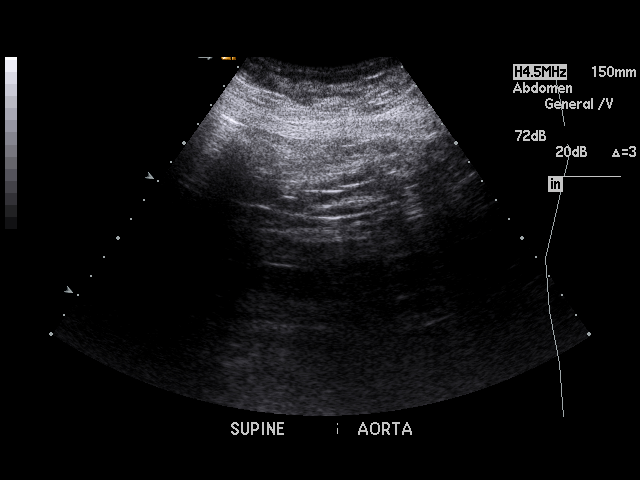
[im 37/49]
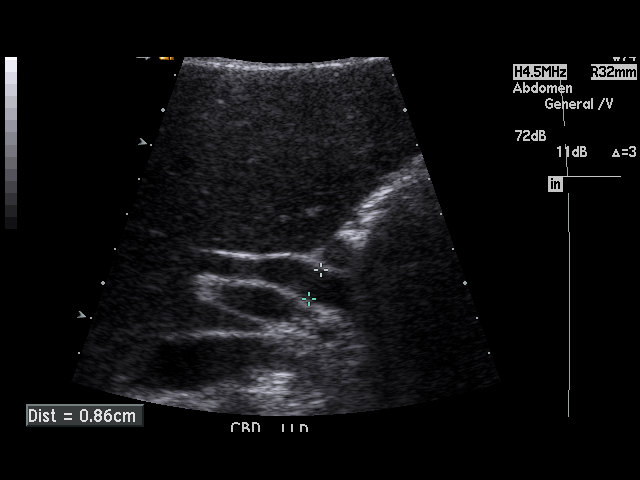
[im 39/49]
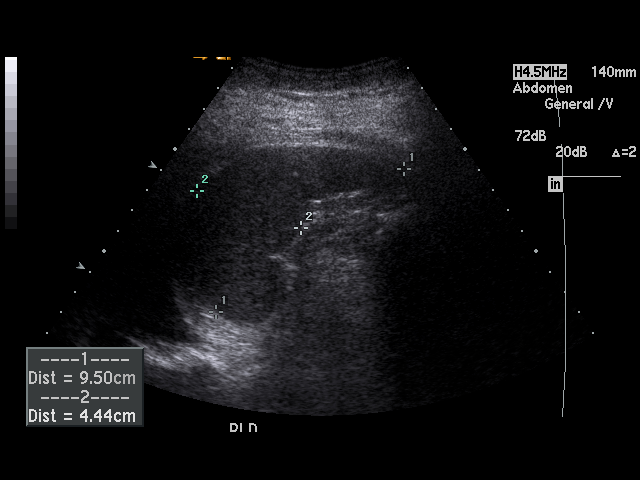
[im 43/49]
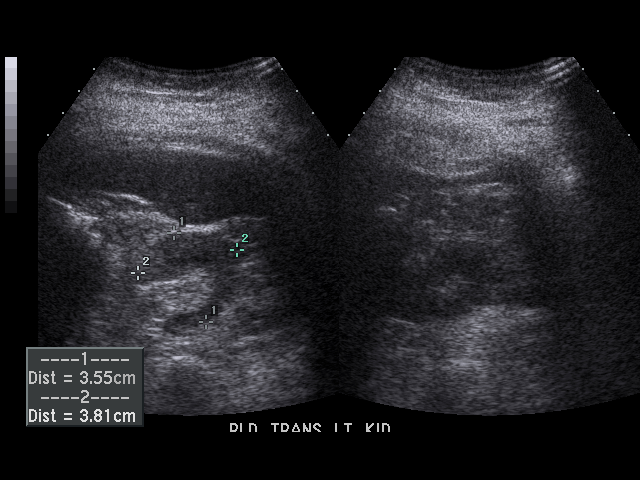
[im 45/49]
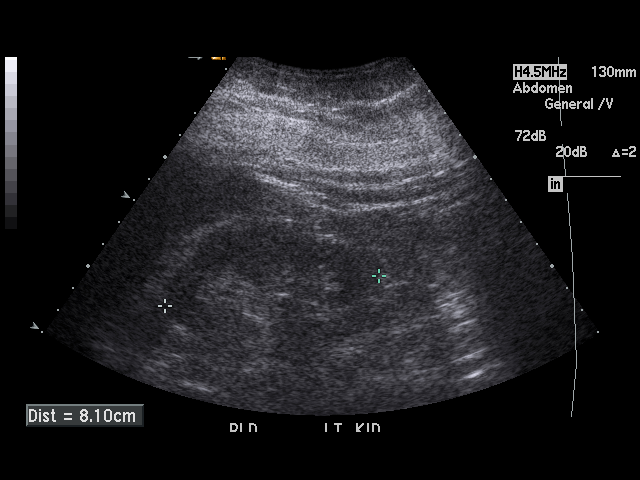
[im 49/49]
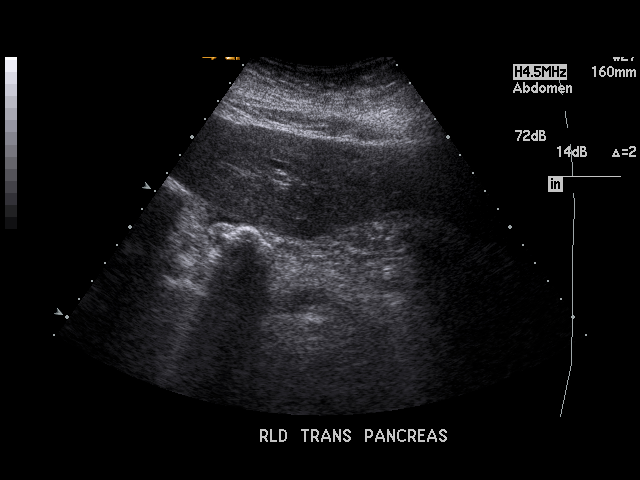

[17 of 25 positions shown; findings below may reference images not displayed]

PROCEDURE:     US  - US ABDOMEN GENERAL SURVEY  - May 14, 2010  [DATE]

RESULT:     Sonographic evaluation of the abdomen is performed. There is a
history of cholecystectomy. Images of the pancreas show no definite
abnormality although portions of the head and tail are not well seen because
of overlying bowel gas. The liver demonstrates a normal appearing
echotexture without a discrete mass or ductal dilation. Renal cortical
thinning is present bilaterally with renal atrophy. The left kidney length
is 8.1 cm with the right being 7.9 cm. The visualized portions of the aorta
and inferior vena cava appear unremarkable. The common bile duct diameter is
8.6 mm at its largest measured site. There is no ascites. The spleen is
unremarkable. The portal venous flow is normal.
IMPRESSION: 1.  Status post cholecystectomy. The common bile duct diameter is 8.6 mm
which can be within normal limits post cholecystectomy.
2.  Renal cortical thinning bilaterally.
3.  Partial visualization of the pancreas without gross abnormality.

## 2010-09-26 IMAGING — CR DG ABDOMEN 3V
1 series · 5 of 5 positions shown · non-contrast
Comparison: none

REASON FOR EXAM: Pain
COMMENTS:   May transport without cardiac monitor

[Series 1: view not recorded · 0.17mm/px · 5 of 5 slices shown]
[im 1/5]
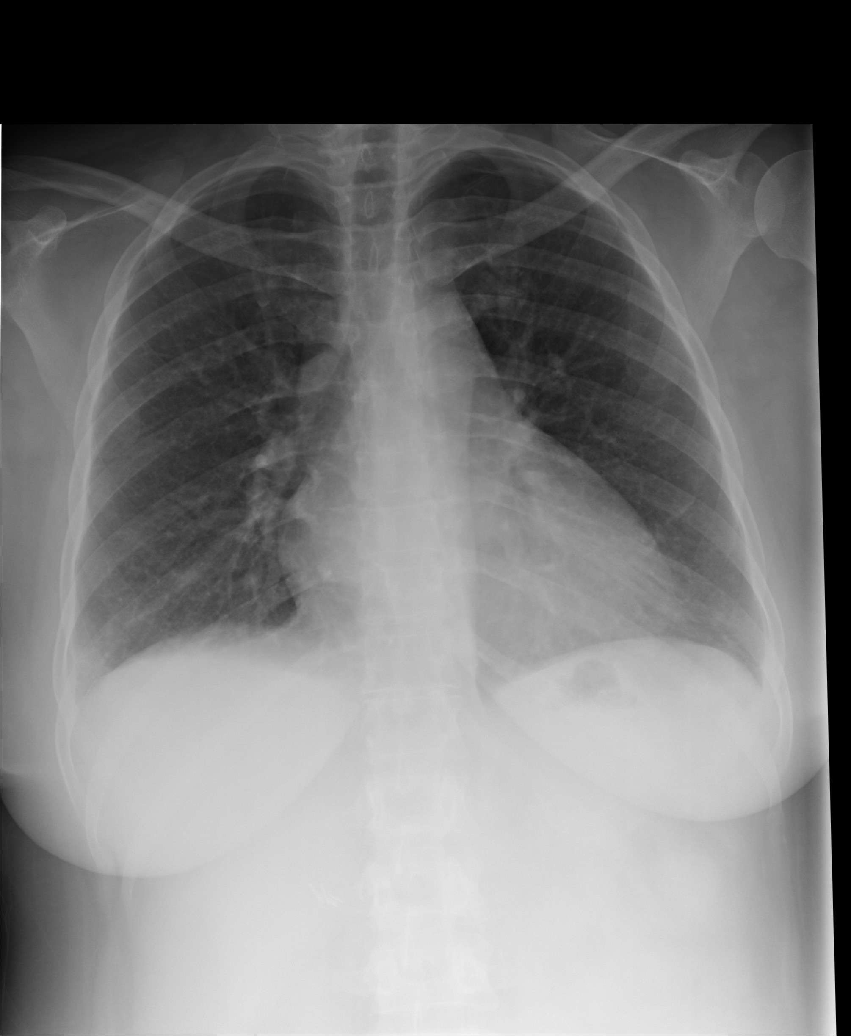
[im 2/5]
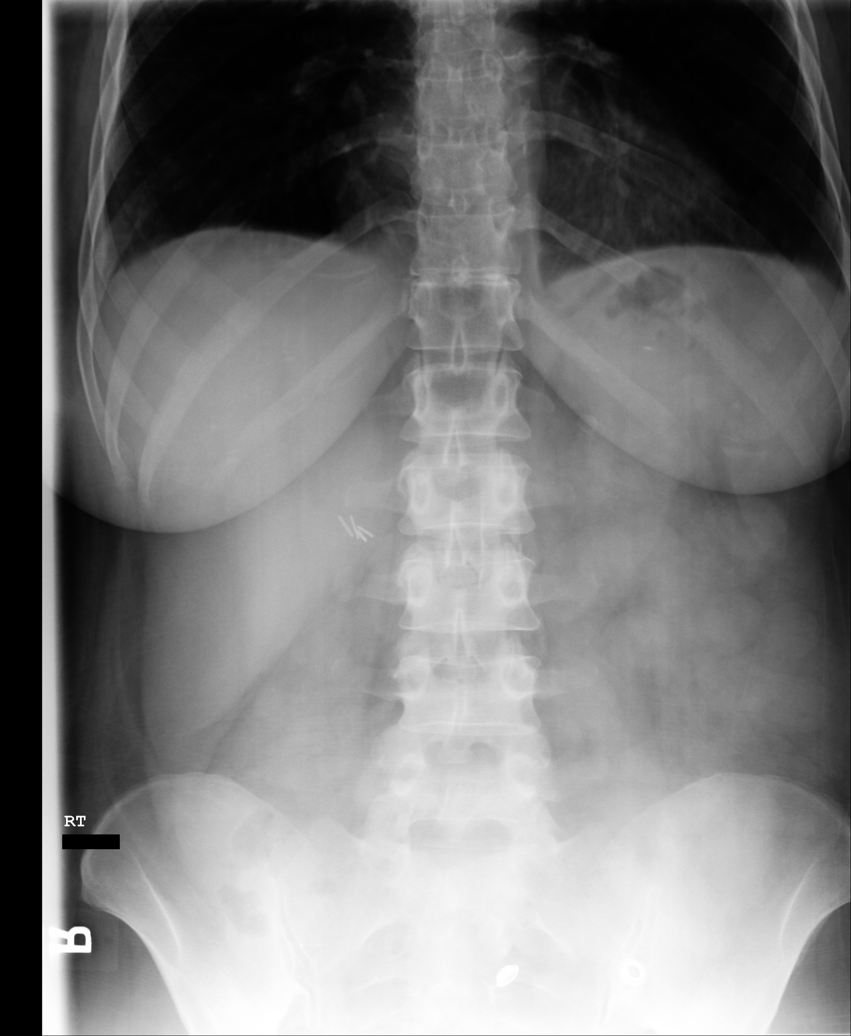
[im 3/5]
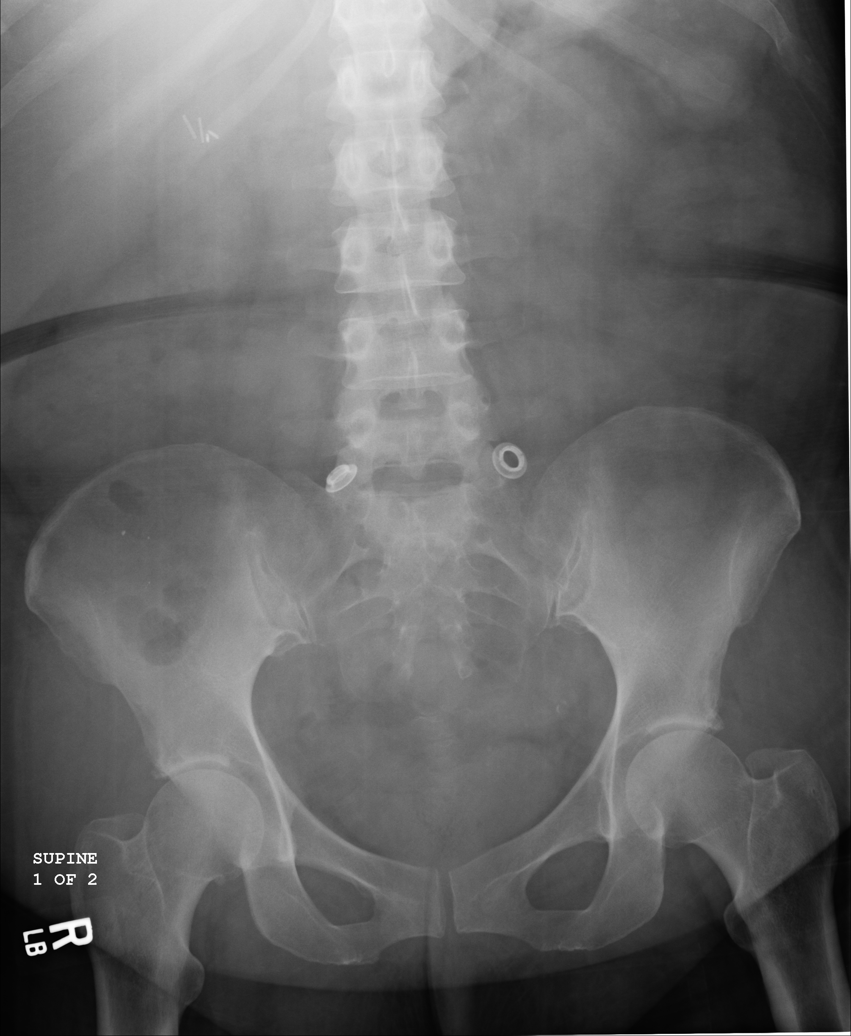
[im 4/5]
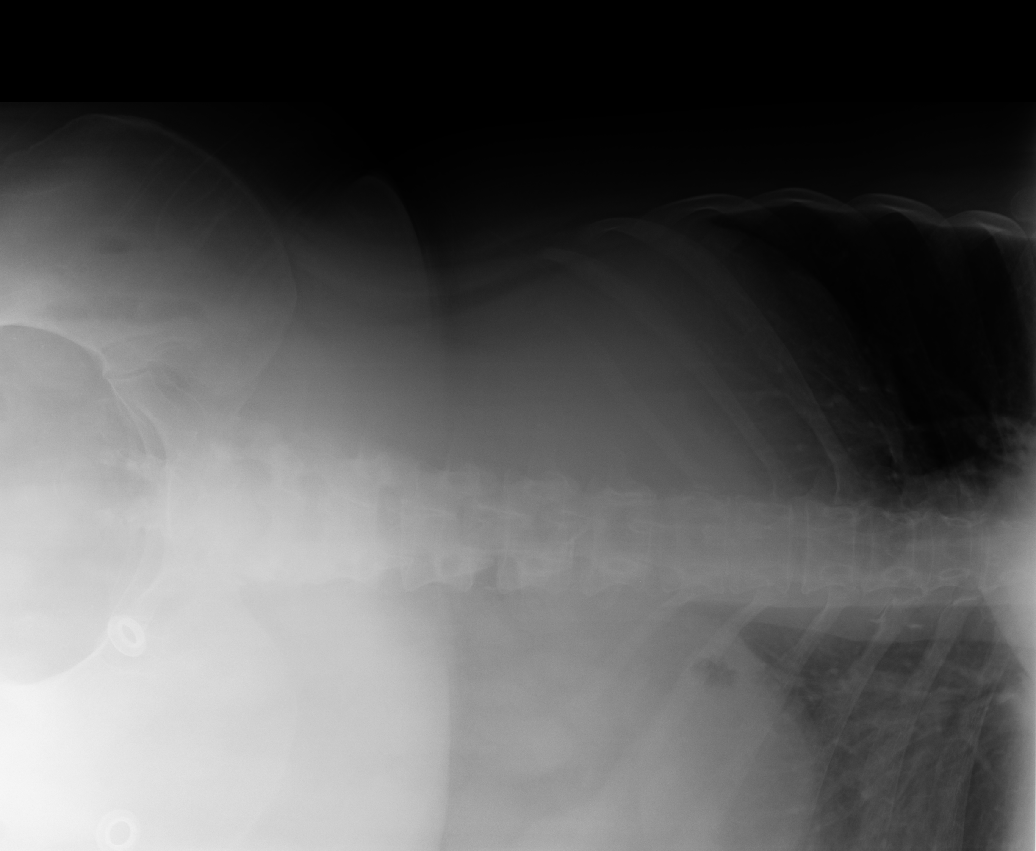
[im 5/5]
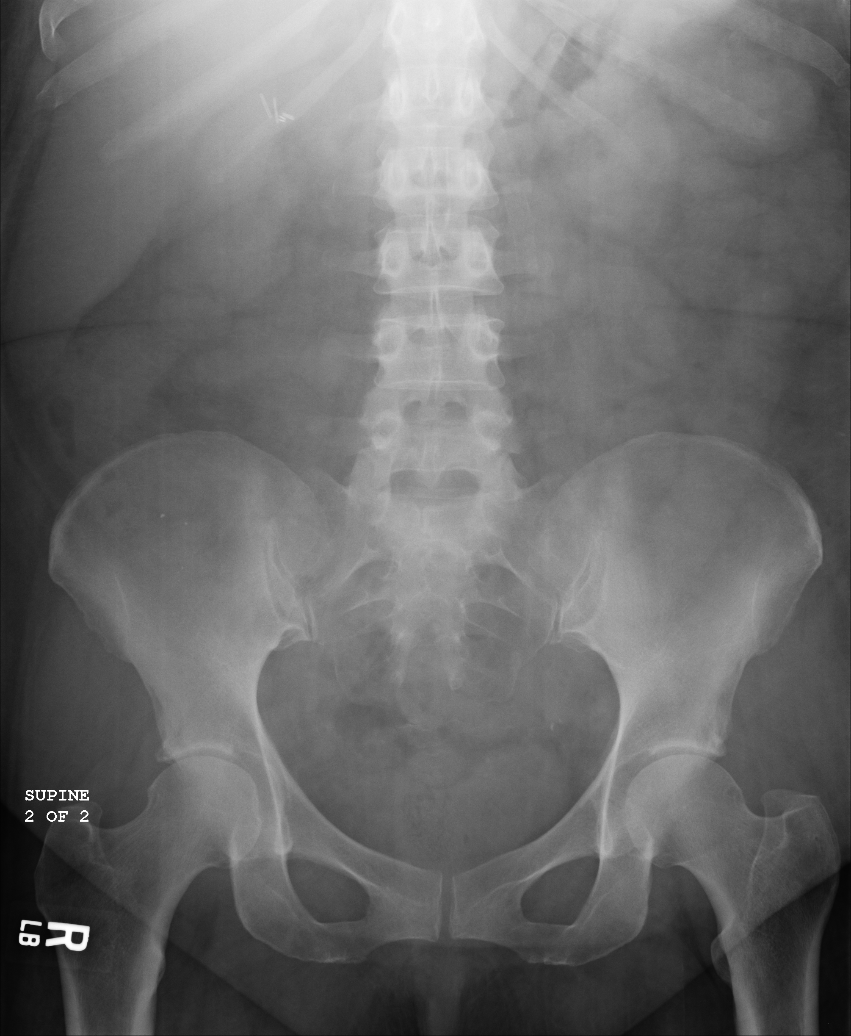

[5 of 5 positions shown; findings below may reference images not displayed]

PROCEDURE:     DXR - DXR ABDOMEN 3-WAY (INCL PA CXR)  - May 21, 2010  [DATE]

RESULT:     Comparison is made to a prior exam of 05/13/2010. PA view of the
chest shows the lung fields to be clear. No subdiaphragmatic free air is
seen. Bowel gas pattern is normal. No dilated bowel loops are seen. No
air-fluid levels are identified. Postoperative metallic clips are noted in
the region of the gallbladder bed. No acute bony abnormalities are seen.
IMPRESSION: No acute changes are identified.

## 2010-09-27 ENCOUNTER — Emergency Department: Payer: Self-pay | Admitting: Emergency Medicine

## 2010-10-04 IMAGING — CR DG CHEST 1V PORT
1 series · 1 of 1 positions shown · non-contrast
Comparison: None.

CLINICAL DATA: Shortness of breath

PORTABLE CHEST - 1 VIEW

[view not recorded]
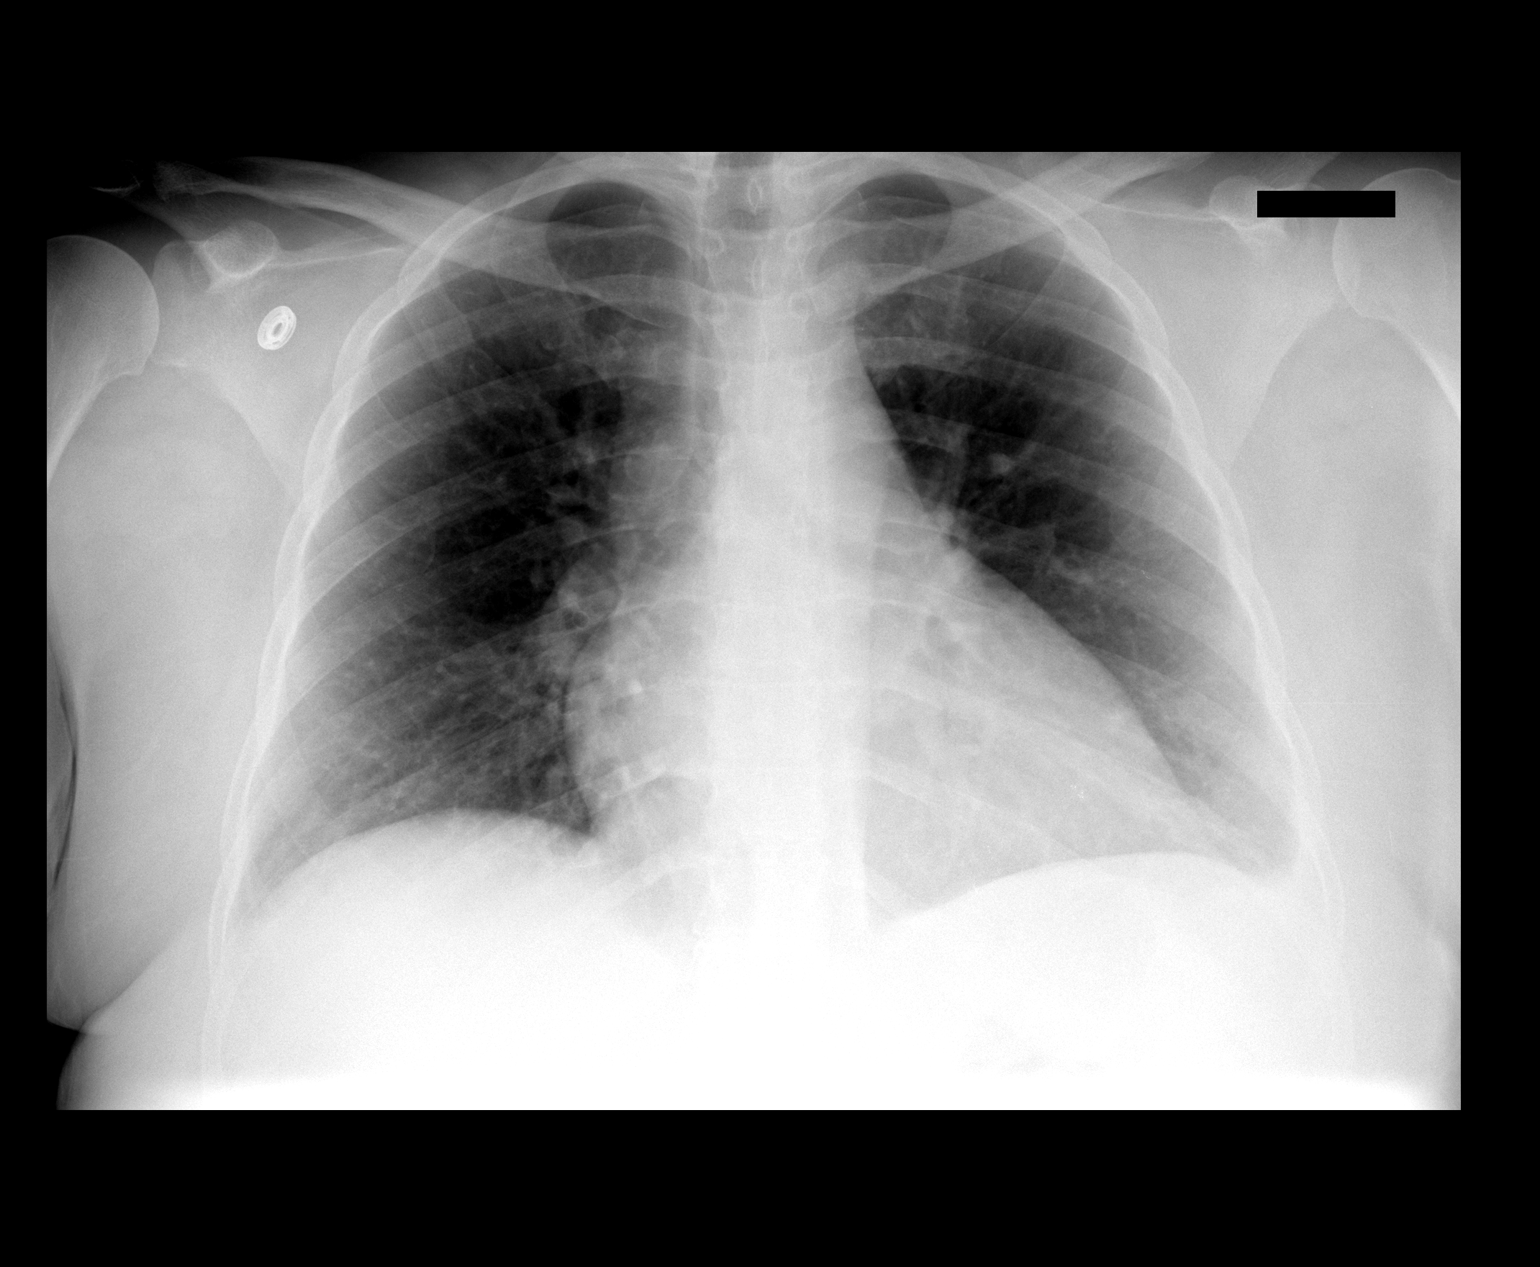

[1 of 1 positions shown; findings below may reference images not displayed]

FINDINGS: The lungs are clear bilaterally.  No confluent airspace
opacities, pleural effuions or pneumothoracies are seen.  The
heart is normal in size and contour.  The upper abdomen and osseous
structures are normal.
IMPRESSION: No acute cardiopulmonary disease.

## 2010-10-30 IMAGING — CR DG ABDOMEN 3V
1 series · 5 of 5 positions shown · non-contrast
Comparison: none

REASON FOR EXAM: abd pain
COMMENTS:

[Series 1: view not recorded · 0.17mm/px · 5 of 5 slices shown]
[im 1/5]
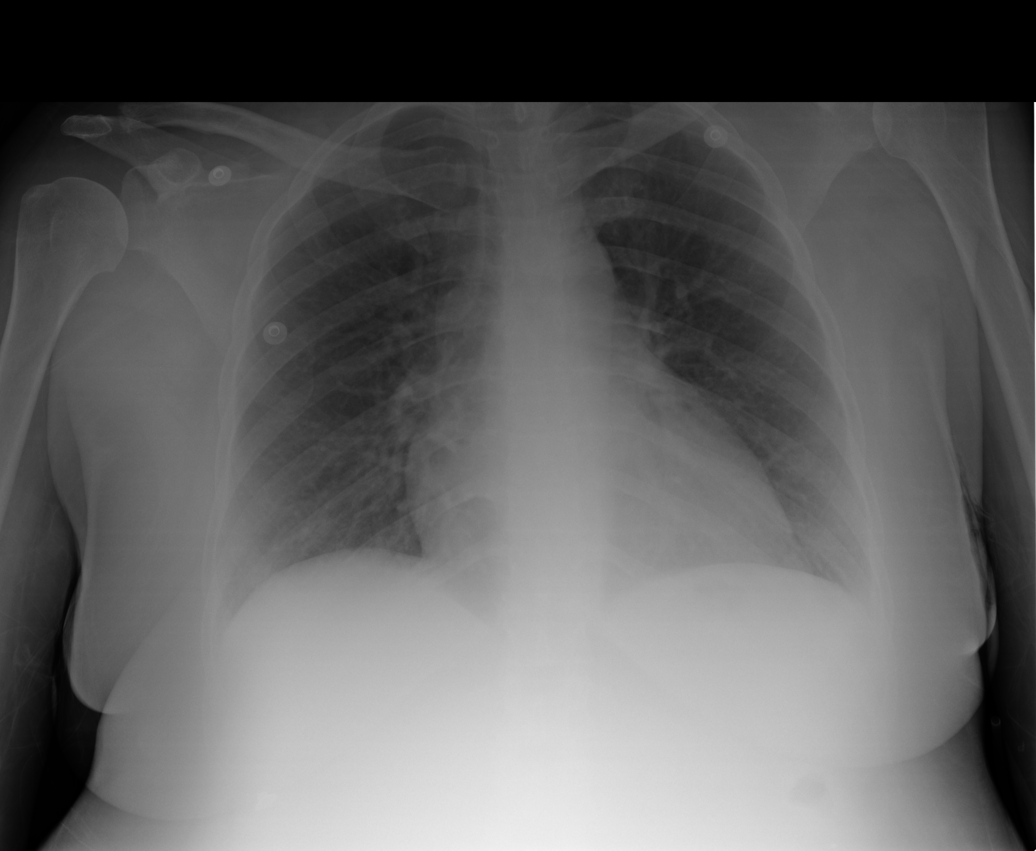
[im 2/5]
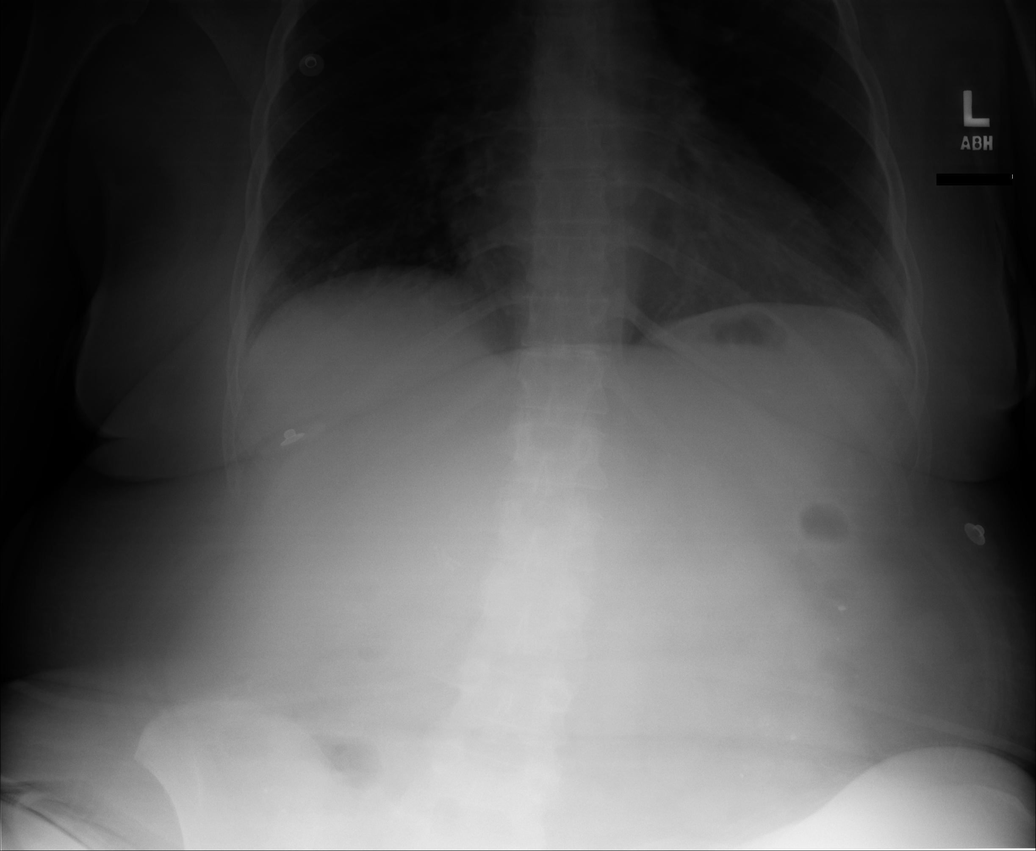
[im 3/5]
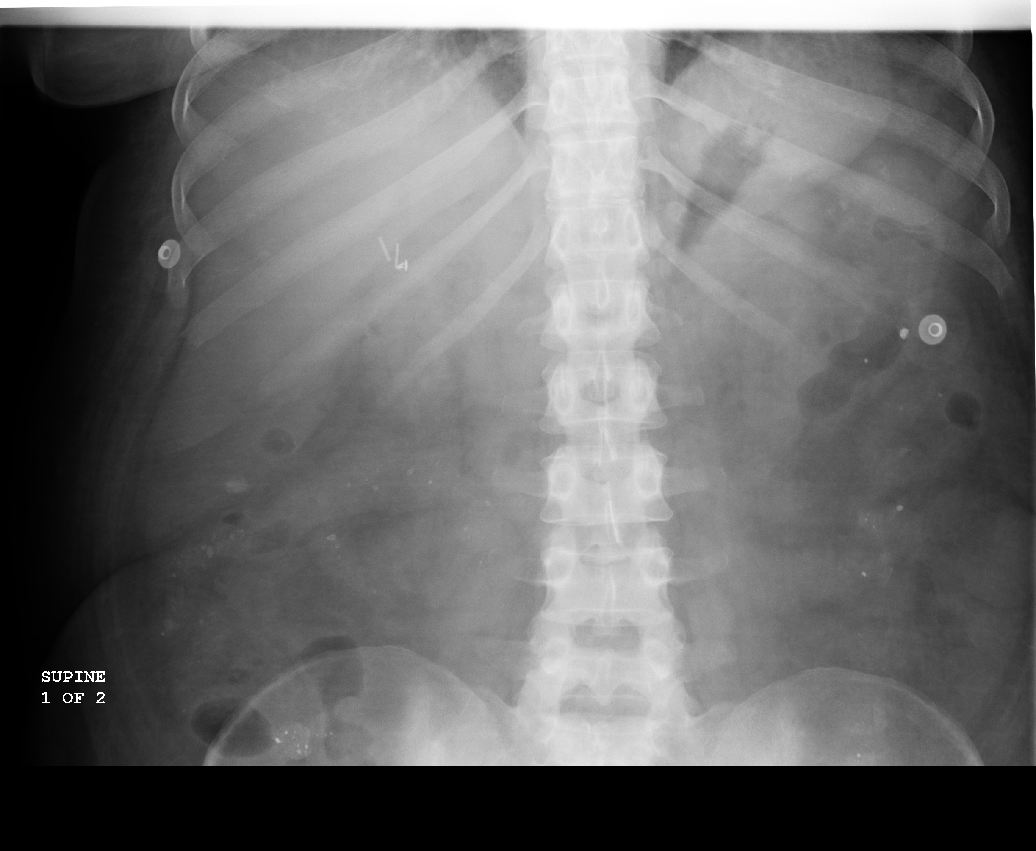
[im 4/5]
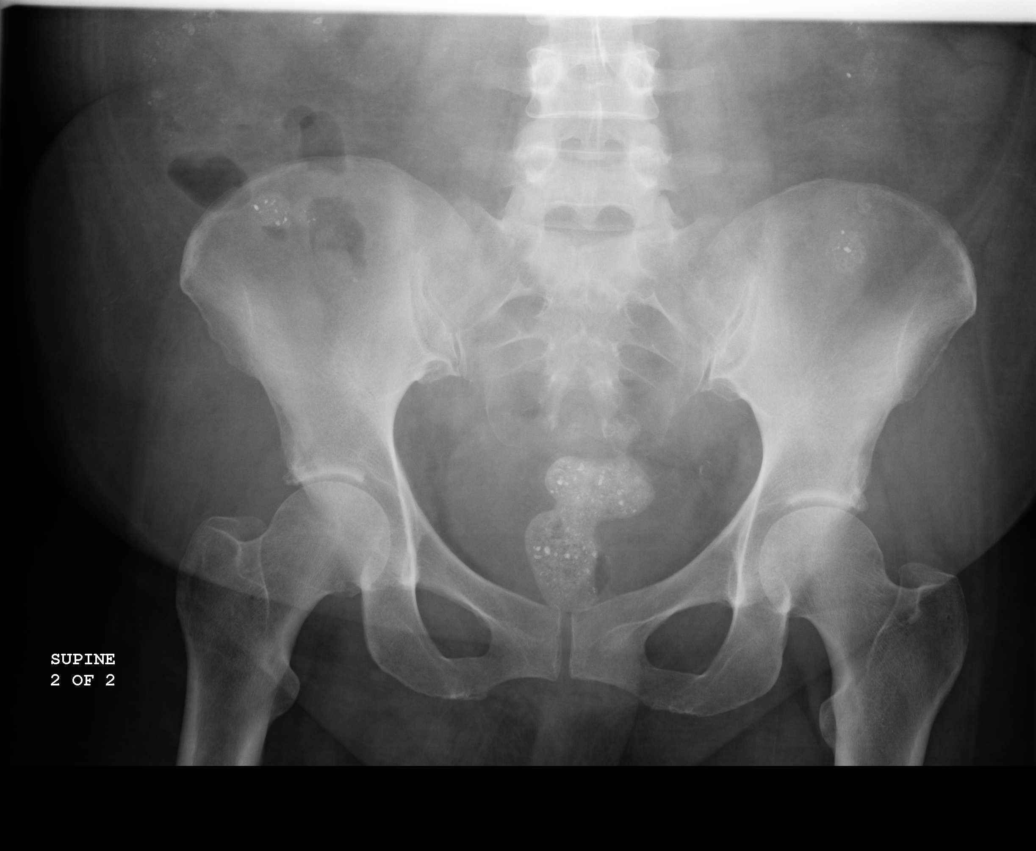
[im 5/5]
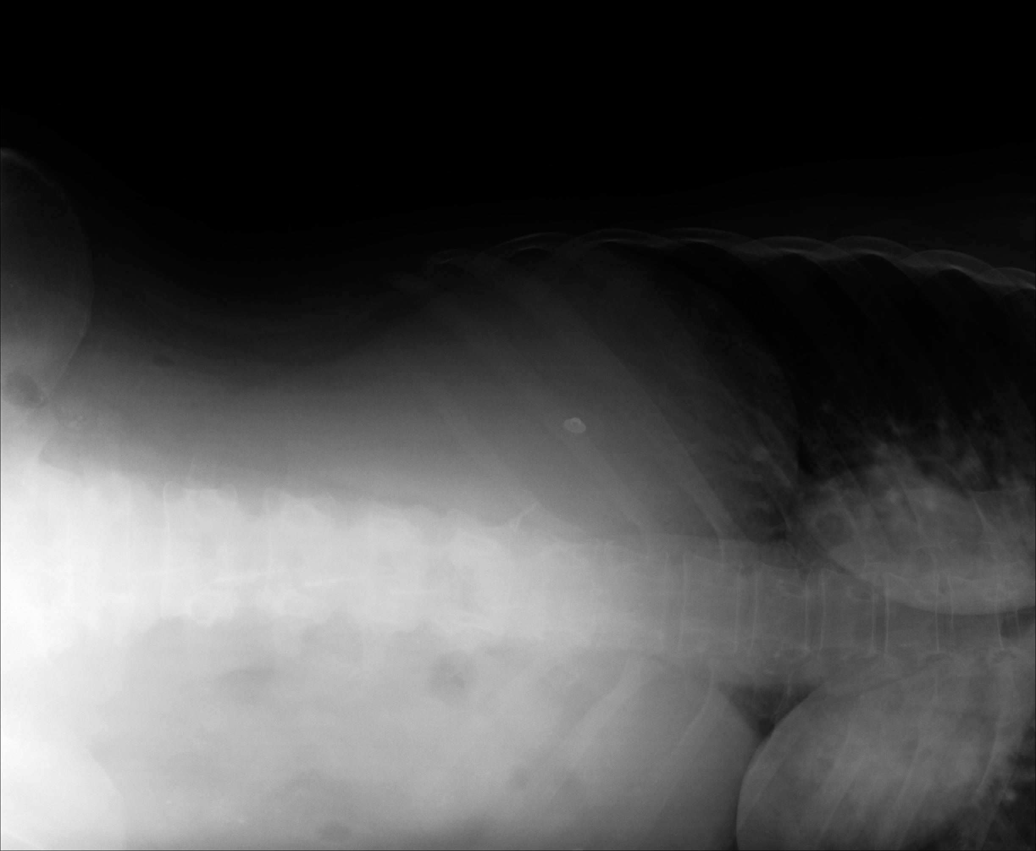

[5 of 5 positions shown; findings below may reference images not displayed]

PROCEDURE:     DXR - DXR ABDOMEN 3-WAY (INCL PA CXR)  - June 24, 2010  [DATE]

RESULT:     Comparison is made to a prior exam of 05/22/2010.

The lung fields remain clear. No acute changes of the heart or pulmonary
vasculature are identified.

Four views of the abdomen were obtained. No subdiaphragmatic free air is
seen. The bowel gas pattern is normal. No dilated bowel loops suspicious for
bowel obstruction are seen. A small amount of residual contrast is observed
in the colon presumably from prior CT or prior GI studies. Post operative
metallic clips are noted in the region of the gallbladder bed. No definitely
abnormal intra-abdominal calcifications are seen. The osseous structures are
normal in appearance.
IMPRESSION: No acute changes are identified.

## 2010-11-05 IMAGING — CR DG ABD PORTABLE 1V
1 series · 1 of 1 positions shown · non-contrast
Comparison: None.

CLINICAL DATA: The stomach pain.

ABDOMEN - 1 VIEW

[view not recorded]
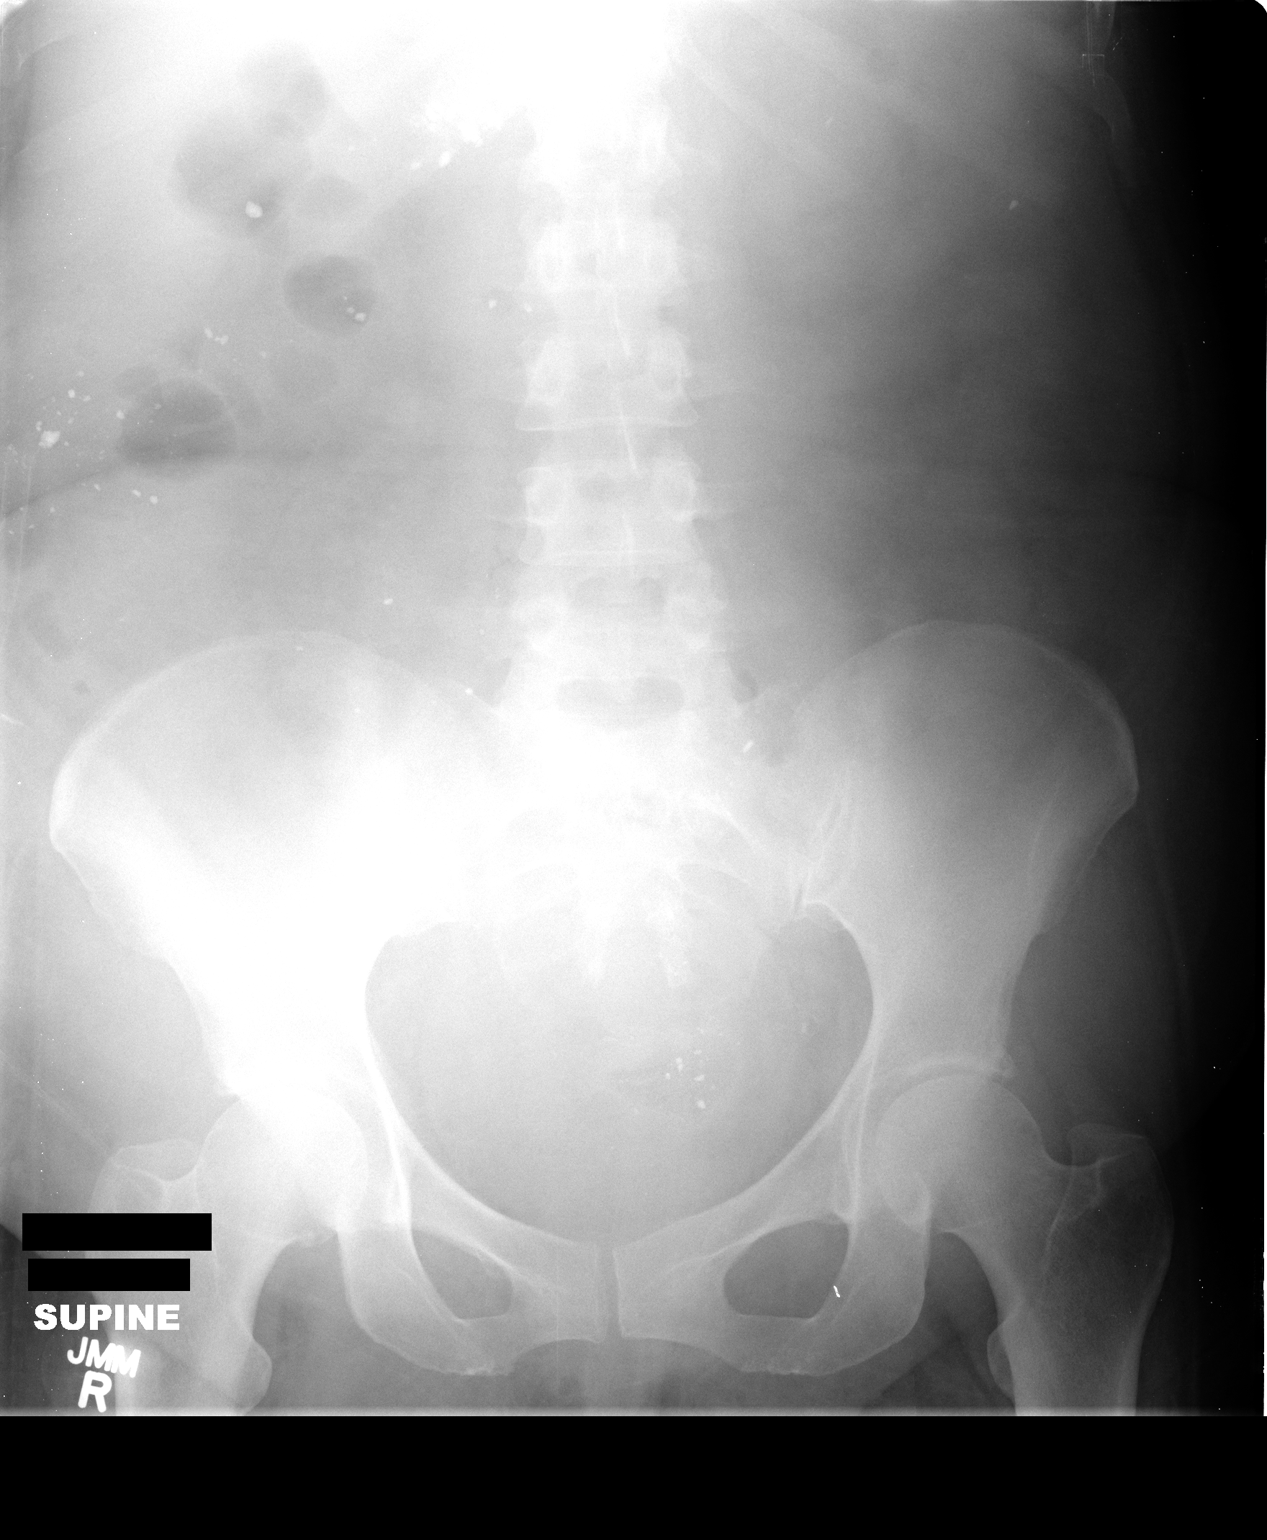

[1 of 1 positions shown; findings below may reference images not displayed]

FINDINGS: No gaseous bowel dilatation to suggest obstruction.
Radiopaque material in the right upper quadrant is probably in the
fecal stream.  Question pancreatic calcification.  Visualized bony
structures are unremarkable.
IMPRESSION: No evidence for bowel obstruction.

## 2010-11-05 IMAGING — CR DG CHEST 1V PORT
1 series · 1 of 1 positions shown · non-contrast
Comparison: 05/29/2010

CLINICAL DATA: Fever.  Hypertension.

PORTABLE CHEST - 1 VIEW

[view not recorded]
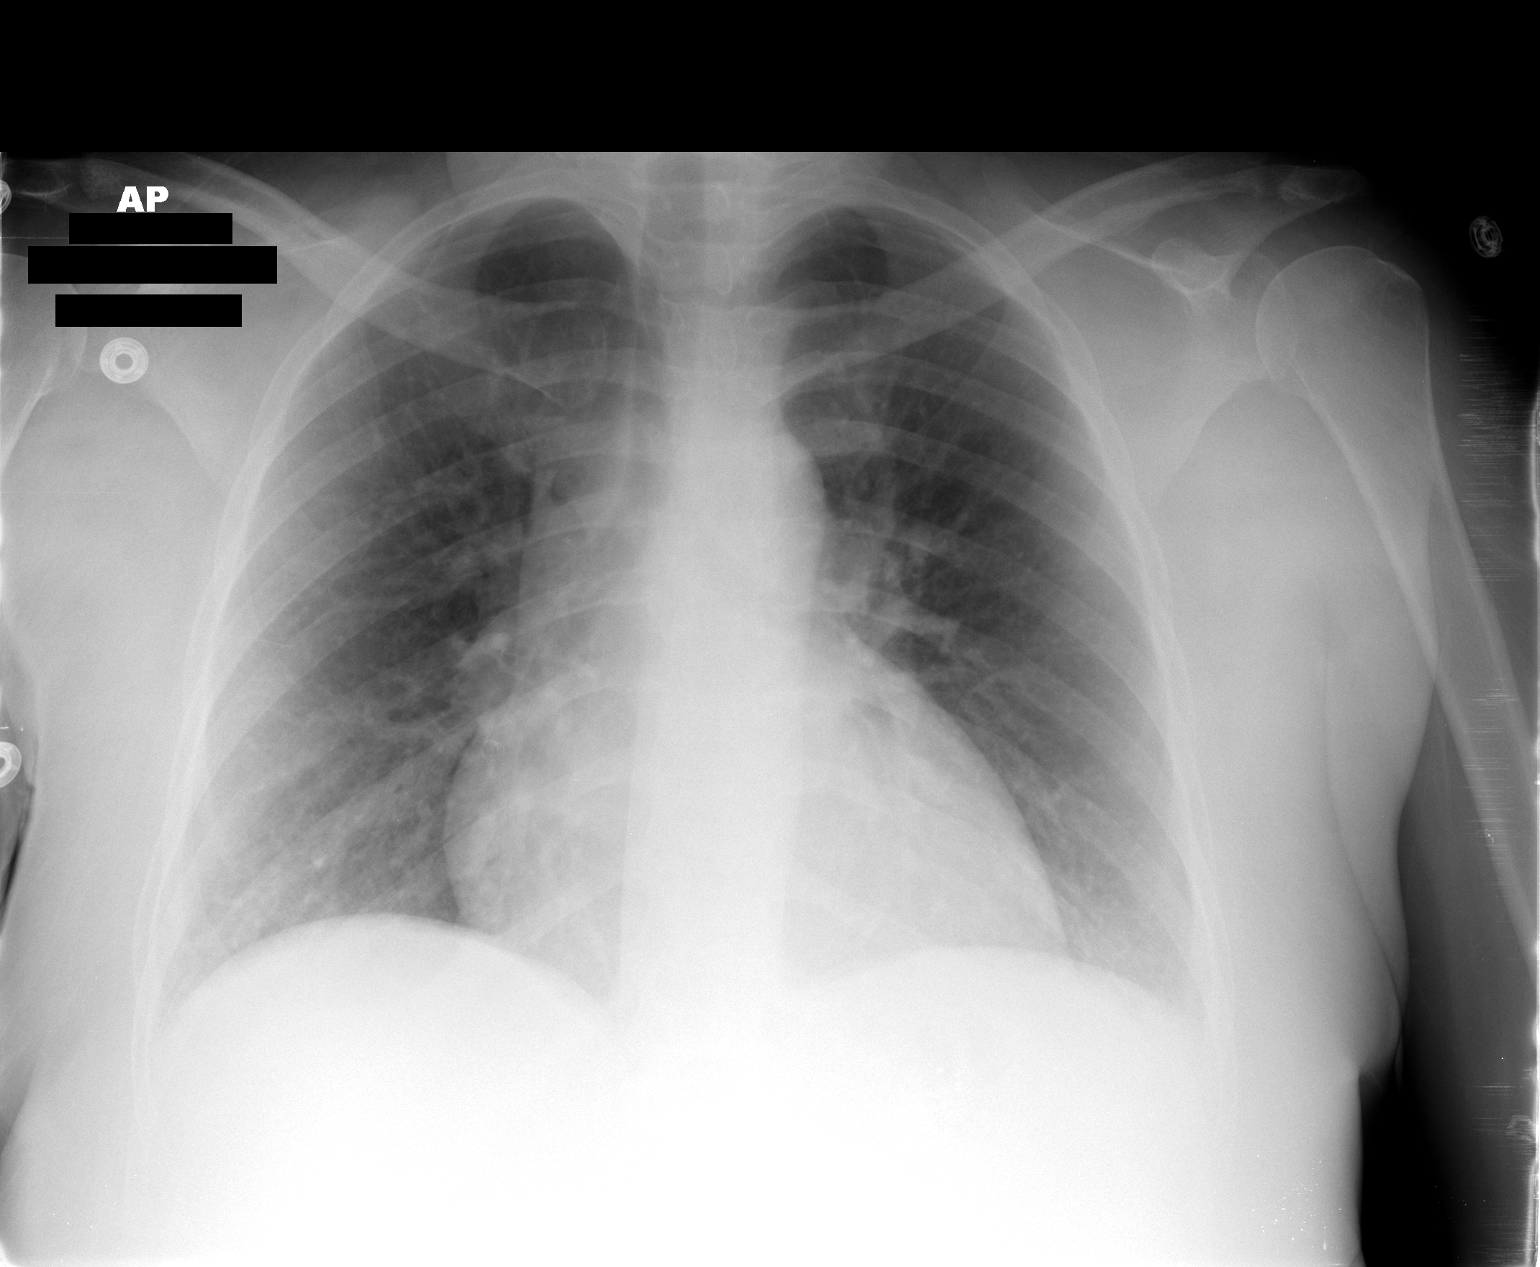

[1 of 1 positions shown; findings below may reference images not displayed]

FINDINGS: 4514 hours. The cardiopericardial silhouette is enlarged.
Mild vascular congestion without overt pulmonary edema.  No focal
airspace consolidation. Imaged bony structures of the thorax are
intact.
IMPRESSION: Cardiomegaly.  No acute cardiopulmonary process.

## 2010-11-07 ENCOUNTER — Inpatient Hospital Stay: Payer: Self-pay | Admitting: *Deleted

## 2010-11-08 IMAGING — CR DG ABDOMEN 3V
1 series · 4 of 4 positions shown · non-contrast
Comparison: none

REASON FOR EXAM: abd pain, hx of gastroparesis
COMMENTS:

PROCEDURE:     DXR - DXR ABDOMEN 3-WAY (INCL PA CXR)  - July 03, 2010  [DATE]
RESULT:     No acute cardiopulmonary disease.
The gas pattern is nonspecific. Surgical clips noted in the right upper
quadrant. Contrast is noted in the colon. No free air is noted.

[Series 1: view not recorded · 0.17mm/px · 4 of 4 slices shown]
[im 1/4]
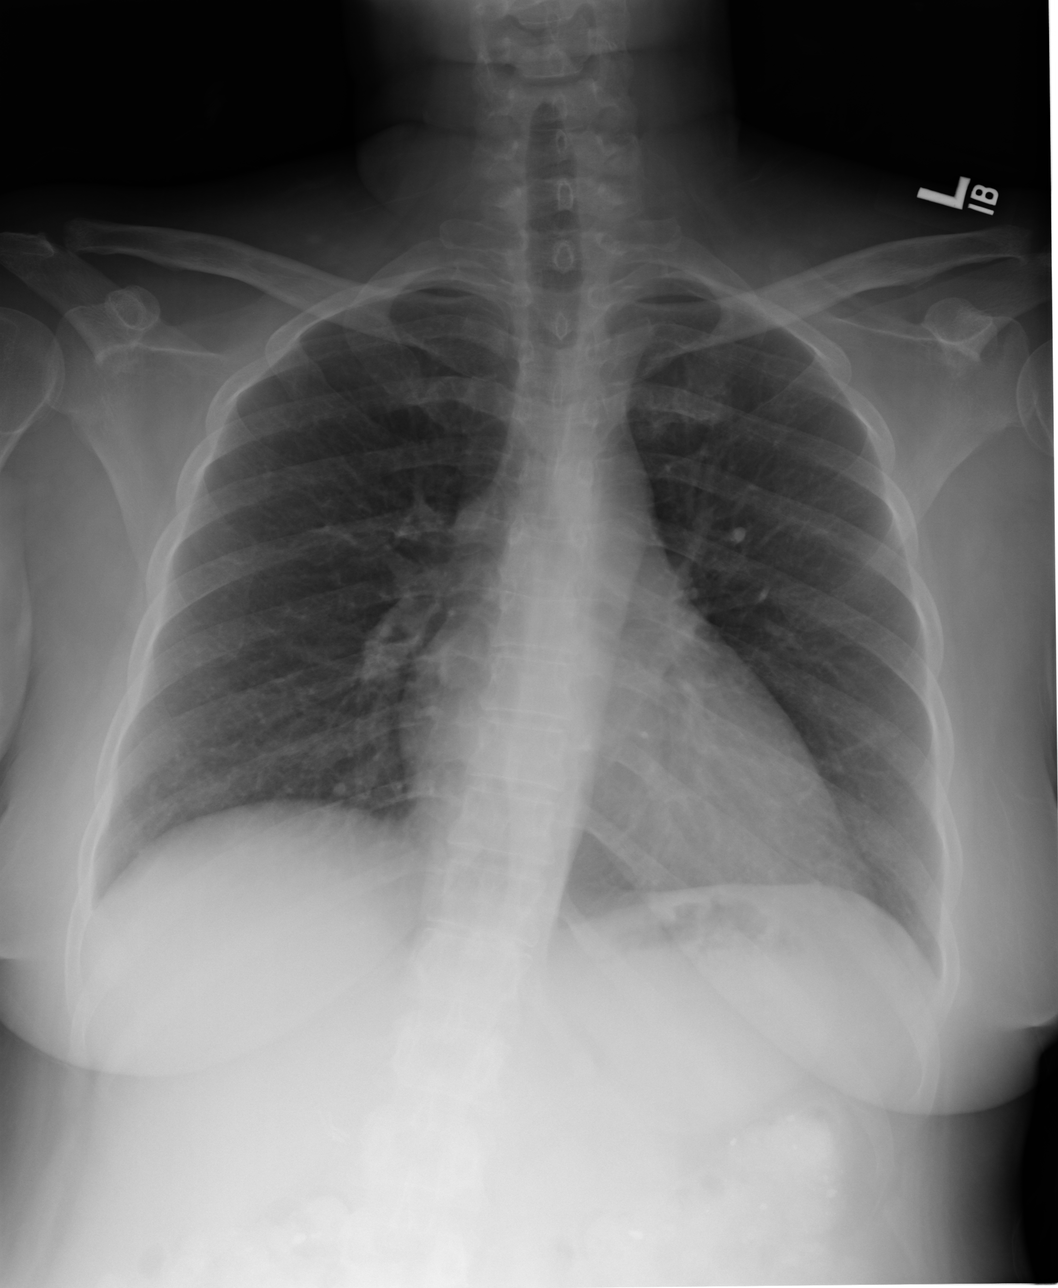
[im 2/4]
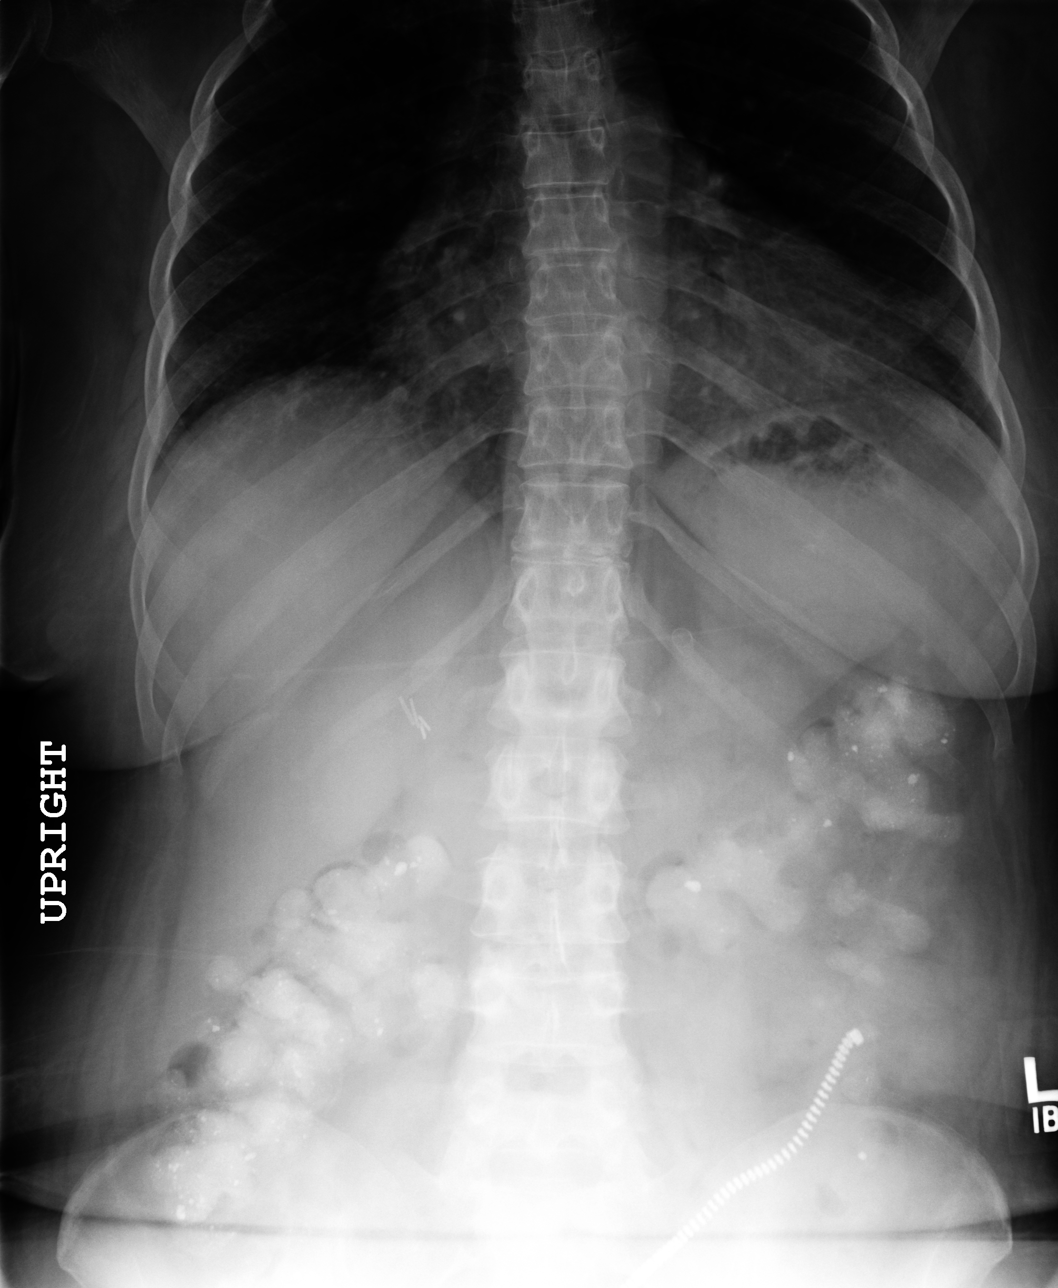
[im 3/4]
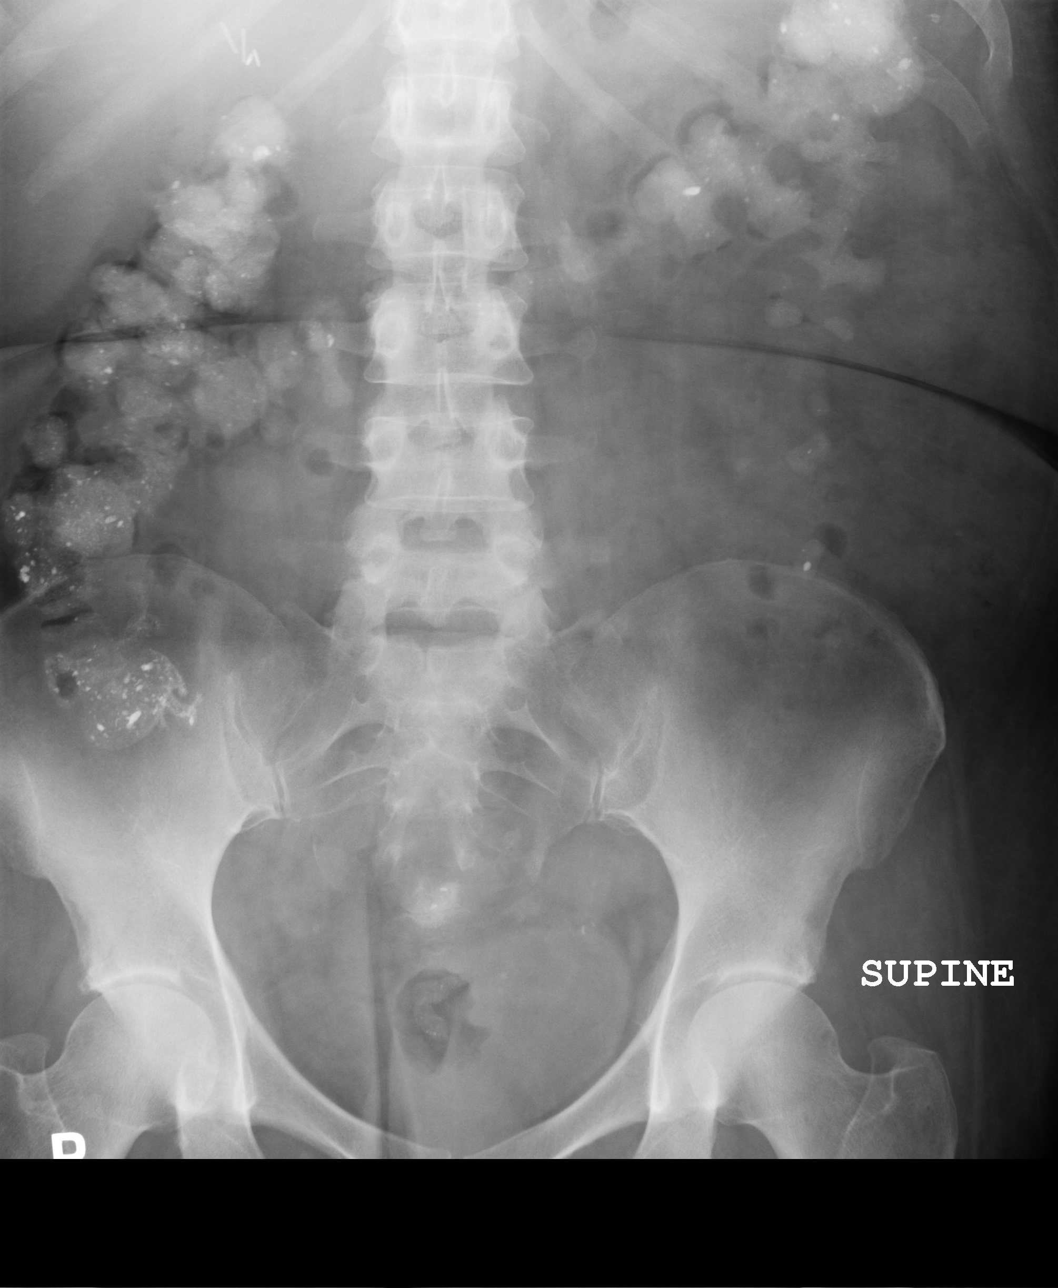
[im 4/4]
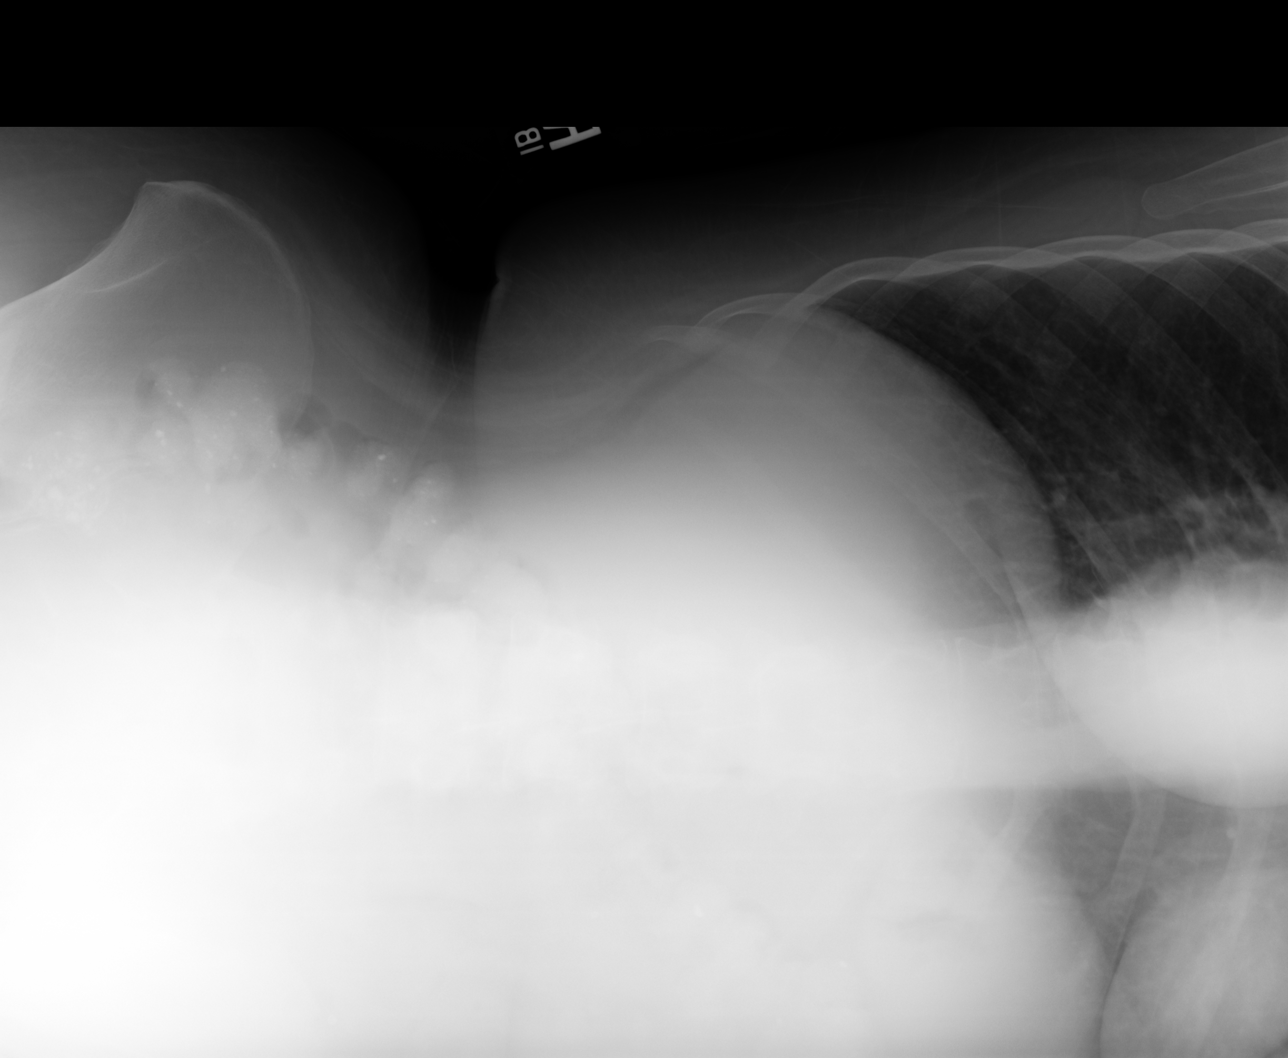

[4 of 4 positions shown; findings below may reference images not displayed]

IMPRESSION: 1     Nonspecific exam.
2     No acute cardiopulmonary disease.

## 2010-12-05 LAB — BASIC METABOLIC PANEL
BUN: 5 mg/dL — ABNORMAL LOW (ref 6–23)
BUN: 7 mg/dL (ref 6–23)
BUN: 12 mg/dL (ref 6–23)
BUN: 5 mg/dL — ABNORMAL LOW (ref 6–23)
CO2: 29 mEq/L (ref 19–32)
CO2: 32 mEq/L (ref 19–32)
CO2: 29 mEq/L (ref 19–32)
CO2: 32 mEq/L (ref 19–32)
Calcium: 8.5 mg/dL (ref 8.4–10.5)
Calcium: 8.6 mg/dL (ref 8.4–10.5)
Calcium: 8.8 mg/dL (ref 8.4–10.5)
Calcium: 8.4 mg/dL (ref 8.4–10.5)
Chloride: 103 mEq/L (ref 96–112)
Chloride: 99 mEq/L (ref 96–112)
Chloride: 97 mEq/L (ref 96–112)
Chloride: 100 mEq/L (ref 96–112)
Creatinine, Ser: 3.44 mg/dL — ABNORMAL HIGH (ref 0.4–1.2)
Creatinine, Ser: 4.21 mg/dL — ABNORMAL HIGH (ref 0.4–1.2)
Creatinine, Ser: 5.31 mg/dL — ABNORMAL HIGH (ref 0.4–1.2)
Creatinine, Ser: 3.89 mg/dL — ABNORMAL HIGH (ref 0.4–1.2)
GFR calc Af Amer: 13 mL/min — ABNORMAL LOW (ref >60)
GFR calc Af Amer: 17 mL/min — ABNORMAL LOW (ref >60)
GFR calc Af Amer: 10 mL/min — ABNORMAL LOW (ref >60)
GFR calc Af Amer: 15 mL/min — ABNORMAL LOW (ref >60)
GFR calc non Af Amer: 11 mL/min — ABNORMAL LOW (ref >60)
GFR calc non Af Amer: 14 mL/min — ABNORMAL LOW (ref >60)
GFR calc non Af Amer: 9 mL/min — ABNORMAL LOW (ref >60)
GFR calc non Af Amer: 12 mL/min — ABNORMAL LOW (ref >60)
Glucose, Bld: 133 mg/dL — ABNORMAL HIGH (ref 70–99)
Glucose, Bld: 137 mg/dL — ABNORMAL HIGH (ref 70–99)
Glucose, Bld: 148 mg/dL — ABNORMAL HIGH (ref 70–99)
Glucose, Bld: 235 mg/dL — ABNORMAL HIGH (ref 70–99)
Potassium: 3.2 mEq/L — ABNORMAL LOW (ref 3.5–5.1)
Potassium: 3.8 mEq/L (ref 3.5–5.1)
Potassium: 5 mEq/L (ref 3.5–5.1)
Potassium: 3.4 mEq/L — ABNORMAL LOW (ref 3.5–5.1)
Sodium: 136 mEq/L (ref 135–145)
Sodium: 139 mEq/L (ref 135–145)
Sodium: 133 mEq/L — ABNORMAL LOW (ref 135–145)
Sodium: 141 mEq/L (ref 135–145)

## 2010-12-05 LAB — COMPREHENSIVE METABOLIC PANEL
ALT: 12 U/L (ref 0–35)
ALT: 12 U/L (ref 0–35)
ALT: 12 U/L (ref 0–35)
ALT: 12 U/L (ref 0–35)
ALT: 20 U/L (ref 0–35)
AST: 20 U/L (ref 0–37)
AST: 15 U/L (ref 0–37)
AST: 19 U/L (ref 0–37)
AST: 19 U/L (ref 0–37)
AST: 24 U/L (ref 0–37)
Albumin: 2.8 g/dL — ABNORMAL LOW (ref 3.5–5.2)
Albumin: 3 g/dL — ABNORMAL LOW (ref 3.5–5.2)
Albumin: 3 g/dL — ABNORMAL LOW (ref 3.5–5.2)
Albumin: 3.1 g/dL — ABNORMAL LOW (ref 3.5–5.2)
Albumin: 3.4 g/dL — ABNORMAL LOW (ref 3.5–5.2)
Alkaline Phosphatase: 60 U/L (ref 39–117)
Alkaline Phosphatase: 68 U/L (ref 39–117)
Alkaline Phosphatase: 66 U/L (ref 39–117)
Alkaline Phosphatase: 69 U/L (ref 39–117)
Alkaline Phosphatase: 77 U/L (ref 39–117)
BUN: 6 mg/dL (ref 6–23)
BUN: 12 mg/dL (ref 6–23)
BUN: 11 mg/dL (ref 6–23)
BUN: 11 mg/dL (ref 6–23)
BUN: 6 mg/dL (ref 6–23)
CO2: 30 mEq/L (ref 19–32)
CO2: 33 mEq/L — ABNORMAL HIGH (ref 19–32)
CO2: 30 mEq/L (ref 19–32)
CO2: 30 mEq/L (ref 19–32)
CO2: 32 mEq/L (ref 19–32)
Calcium: 8.6 mg/dL (ref 8.4–10.5)
Calcium: 9.2 mg/dL (ref 8.4–10.5)
Calcium: 8.5 mg/dL (ref 8.4–10.5)
Calcium: 8.6 mg/dL (ref 8.4–10.5)
Calcium: 8.8 mg/dL (ref 8.4–10.5)
Chloride: 101 mEq/L (ref 96–112)
Chloride: 93 mEq/L — ABNORMAL LOW (ref 96–112)
Chloride: 91 mEq/L — ABNORMAL LOW (ref 96–112)
Chloride: 94 mEq/L — ABNORMAL LOW (ref 96–112)
Chloride: 97 mEq/L (ref 96–112)
Creatinine, Ser: 3.82 mg/dL — ABNORMAL HIGH (ref 0.4–1.2)
Creatinine, Ser: 6.17 mg/dL — ABNORMAL HIGH (ref 0.4–1.2)
Creatinine, Ser: 4.19 mg/dL — ABNORMAL HIGH (ref 0.4–1.2)
Creatinine, Ser: 4.91 mg/dL — ABNORMAL HIGH (ref 0.4–1.2)
Creatinine, Ser: 5.67 mg/dL — ABNORMAL HIGH (ref 0.4–1.2)
GFR calc Af Amer: 15 mL/min — ABNORMAL LOW (ref >60)
GFR calc Af Amer: 9 mL/min — ABNORMAL LOW (ref >60)
GFR calc Af Amer: 10 mL/min — ABNORMAL LOW (ref >60)
GFR calc Af Amer: 11 mL/min — ABNORMAL LOW (ref >60)
GFR calc Af Amer: 14 mL/min — ABNORMAL LOW (ref >60)
GFR calc non Af Amer: 12 mL/min — ABNORMAL LOW (ref >60)
GFR calc non Af Amer: 7 mL/min — ABNORMAL LOW (ref >60)
GFR calc non Af Amer: 11 mL/min — ABNORMAL LOW (ref >60)
GFR calc non Af Amer: 8 mL/min — ABNORMAL LOW (ref >60)
GFR calc non Af Amer: 9 mL/min — ABNORMAL LOW (ref >60)
Glucose, Bld: 77 mg/dL (ref 70–99)
Glucose, Bld: 95 mg/dL (ref 70–99)
Glucose, Bld: 50 mg/dL — ABNORMAL LOW (ref 70–99)
Glucose, Bld: 66 mg/dL — ABNORMAL LOW (ref 70–99)
Glucose, Bld: 88 mg/dL (ref 70–99)
Potassium: 3.3 mEq/L — ABNORMAL LOW (ref 3.5–5.1)
Potassium: 5.4 mEq/L — ABNORMAL HIGH (ref 3.5–5.1)
Potassium: 3.7 mEq/L (ref 3.5–5.1)
Potassium: 4.1 mEq/L (ref 3.5–5.1)
Potassium: 4.1 mEq/L (ref 3.5–5.1)
Sodium: 139 mEq/L (ref 135–145)
Sodium: 132 mEq/L — ABNORMAL LOW (ref 135–145)
Sodium: 130 mEq/L — ABNORMAL LOW (ref 135–145)
Sodium: 133 mEq/L — ABNORMAL LOW (ref 135–145)
Sodium: 138 mEq/L (ref 135–145)
Total Bilirubin: 0.6 mg/dL (ref 0.3–1.2)
Total Bilirubin: 0.4 mg/dL (ref 0.3–1.2)
Total Bilirubin: 0.3 mg/dL (ref 0.3–1.2)
Total Bilirubin: 0.4 mg/dL (ref 0.3–1.2)
Total Bilirubin: 0.5 mg/dL (ref 0.3–1.2)
Total Protein: 5.8 g/dL — ABNORMAL LOW (ref 6.0–8.3)
Total Protein: 6 g/dL (ref 6.0–8.3)
Total Protein: 5.7 g/dL — ABNORMAL LOW (ref 6.0–8.3)
Total Protein: 6.1 g/dL (ref 6.0–8.3)
Total Protein: 6.5 g/dL (ref 6.0–8.3)

## 2010-12-05 LAB — HEPATITIS PANEL, ACUTE
HCV Ab: NEGATIVE
Hep A IgM: NEGATIVE
Hep B C IgM: NEGATIVE
Hepatitis B Surface Ag: NEGATIVE

## 2010-12-05 LAB — CULTURE, BLOOD (ROUTINE X 2)
Culture  Setup Time: 201110101419
Culture  Setup Time: 201110101419
Culture: NO GROWTH
Culture: NO GROWTH

## 2010-12-05 LAB — RENAL FUNCTION PANEL
Albumin: 2.7 g/dL — ABNORMAL LOW (ref 3.5–5.2)
Albumin: 2.8 g/dL — ABNORMAL LOW (ref 3.5–5.2)
Albumin: 3.1 g/dL — ABNORMAL LOW (ref 3.5–5.2)
BUN: 7 mg/dL (ref 6–23)
BUN: 10 mg/dL (ref 6–23)
BUN: 9 mg/dL (ref 6–23)
CO2: 32 mEq/L (ref 19–32)
CO2: 29 mEq/L (ref 19–32)
CO2: 30 mEq/L (ref 19–32)
Calcium: 8.5 mg/dL (ref 8.4–10.5)
Calcium: 8.4 mg/dL (ref 8.4–10.5)
Calcium: 8.5 mg/dL (ref 8.4–10.5)
Chloride: 94 mEq/L — ABNORMAL LOW (ref 96–112)
Chloride: 87 mEq/L — ABNORMAL LOW (ref 96–112)
Chloride: 89 mEq/L — ABNORMAL LOW (ref 96–112)
Creatinine, Ser: 4.85 mg/dL — ABNORMAL HIGH (ref 0.4–1.2)
Creatinine, Ser: 5.15 mg/dL — ABNORMAL HIGH (ref 0.4–1.2)
Creatinine, Ser: 5.59 mg/dL — ABNORMAL HIGH (ref 0.4–1.2)
GFR calc Af Amer: 11 mL/min — ABNORMAL LOW (ref >60)
GFR calc Af Amer: 10 mL/min — ABNORMAL LOW (ref >60)
GFR calc Af Amer: 11 mL/min — ABNORMAL LOW (ref >60)
GFR calc non Af Amer: 9 mL/min — ABNORMAL LOW (ref >60)
GFR calc non Af Amer: 8 mL/min — ABNORMAL LOW (ref >60)
GFR calc non Af Amer: 9 mL/min — ABNORMAL LOW (ref >60)
Glucose, Bld: 143 mg/dL — ABNORMAL HIGH (ref 70–99)
Glucose, Bld: 125 mg/dL — ABNORMAL HIGH (ref 70–99)
Glucose, Bld: 136 mg/dL — ABNORMAL HIGH (ref 70–99)
Phosphorus: 3.4 mg/dL (ref 2.3–4.6)
Phosphorus: 4.4 mg/dL (ref 2.3–4.6)
Phosphorus: 4.5 mg/dL (ref 2.3–4.6)
Potassium: 4.5 mEq/L (ref 3.5–5.1)
Potassium: 4 mEq/L (ref 3.5–5.1)
Potassium: 4.4 mEq/L (ref 3.5–5.1)
Sodium: 134 mEq/L — ABNORMAL LOW (ref 135–145)
Sodium: 126 mEq/L — ABNORMAL LOW (ref 135–145)
Sodium: 128 mEq/L — ABNORMAL LOW (ref 135–145)

## 2010-12-05 LAB — TYPE AND SCREEN
ABO/RH(D): O POS
Antibody Screen: NEGATIVE

## 2010-12-05 LAB — CBC
HCT: 32.3 % — ABNORMAL LOW (ref 36.0–46.0)
HCT: 33.4 % — ABNORMAL LOW (ref 36.0–46.0)
HCT: 35.2 % — ABNORMAL LOW (ref 36.0–46.0)
HCT: 27 % — ABNORMAL LOW (ref 36.0–46.0)
HCT: 27.3 % — ABNORMAL LOW (ref 36.0–46.0)
HCT: 29 % — ABNORMAL LOW (ref 36.0–46.0)
HCT: 23.2 % — ABNORMAL LOW (ref 36.0–46.0)
HCT: 23.4 % — ABNORMAL LOW (ref 36.0–46.0)
HCT: 25 % — ABNORMAL LOW (ref 36.0–46.0)
HCT: 28.1 % — ABNORMAL LOW (ref 36.0–46.0)
HCT: 29.5 % — ABNORMAL LOW (ref 36.0–46.0)
HCT: 31 % — ABNORMAL LOW (ref 36.0–46.0)
HCT: 31.8 % — ABNORMAL LOW (ref 36.0–46.0)
Hemoglobin: 10.1 g/dL — ABNORMAL LOW (ref 12.0–15.0)
Hemoglobin: 10.7 g/dL — ABNORMAL LOW (ref 12.0–15.0)
Hemoglobin: 11.4 g/dL — ABNORMAL LOW (ref 12.0–15.0)
Hemoglobin: 8.6 g/dL — ABNORMAL LOW (ref 12.0–15.0)
Hemoglobin: 8.9 g/dL — ABNORMAL LOW (ref 12.0–15.0)
Hemoglobin: 9.3 g/dL — ABNORMAL LOW (ref 12.0–15.0)
Hemoglobin: 10.1 g/dL — ABNORMAL LOW (ref 12.0–15.0)
Hemoglobin: 7.5 g/dL — ABNORMAL LOW (ref 12.0–15.0)
Hemoglobin: 7.7 g/dL — ABNORMAL LOW (ref 12.0–15.0)
Hemoglobin: 8.2 g/dL — ABNORMAL LOW (ref 12.0–15.0)
Hemoglobin: 9.2 g/dL — ABNORMAL LOW (ref 12.0–15.0)
Hemoglobin: 9.2 g/dL — ABNORMAL LOW (ref 12.0–15.0)
Hemoglobin: 9.8 g/dL — ABNORMAL LOW (ref 12.0–15.0)
MCH: 26.9 pg (ref 26.0–34.0)
MCH: 27.6 pg (ref 26.0–34.0)
MCH: 27.9 pg (ref 26.0–34.0)
MCH: 27.6 pg (ref 26.0–34.0)
MCH: 27.9 pg (ref 26.0–34.0)
MCH: 28.1 pg (ref 26.0–34.0)
MCH: 26.9 pg (ref 26.0–34.0)
MCH: 26.9 pg (ref 26.0–34.0)
MCH: 27.1 pg (ref 26.0–34.0)
MCH: 27.2 pg (ref 26.0–34.0)
MCH: 27.3 pg (ref 26.0–34.0)
MCH: 27.4 pg (ref 26.0–34.0)
MCH: 27.6 pg (ref 26.0–34.0)
MCHC: 31.3 g/dL (ref 30.0–36.0)
MCHC: 32 g/dL (ref 30.0–36.0)
MCHC: 32.4 g/dL (ref 30.0–36.0)
MCHC: 31.9 g/dL (ref 30.0–36.0)
MCHC: 32.1 g/dL (ref 30.0–36.0)
MCHC: 32.6 g/dL (ref 30.0–36.0)
MCHC: 31.2 g/dL (ref 30.0–36.0)
MCHC: 31.6 g/dL (ref 30.0–36.0)
MCHC: 31.8 g/dL (ref 30.0–36.0)
MCHC: 32.1 g/dL (ref 30.0–36.0)
MCHC: 32.7 g/dL (ref 30.0–36.0)
MCHC: 32.8 g/dL (ref 30.0–36.0)
MCHC: 33.2 g/dL (ref 30.0–36.0)
MCV: 86.1 fL (ref 78.0–100.0)
MCV: 86.1 fL (ref 78.0–100.0)
MCV: 86.3 fL (ref 78.0–100.0)
MCV: 86.1 fL (ref 78.0–100.0)
MCV: 86.1 fL (ref 78.0–100.0)
MCV: 87.7 fL (ref 78.0–100.0)
MCV: 81.7 fL (ref 78.0–100.0)
MCV: 83.3 fL (ref 78.0–100.0)
MCV: 84.4 fL (ref 78.0–100.0)
MCV: 84.8 fL (ref 78.0–100.0)
MCV: 85.2 fL (ref 78.0–100.0)
MCV: 86.3 fL (ref 78.0–100.0)
MCV: 86.4 fL (ref 78.0–100.0)
Platelets: 140 10*3/uL — ABNORMAL LOW (ref 150–400)
Platelets: 201 10*3/uL (ref 150–400)
Platelets: 237 10*3/uL (ref 150–400)
Platelets: 190 10*3/uL (ref 150–400)
Platelets: 195 10*3/uL (ref 150–400)
Platelets: 216 10*3/uL (ref 150–400)
Platelets: 145 10*3/uL — ABNORMAL LOW (ref 150–400)
Platelets: 169 10*3/uL (ref 150–400)
Platelets: 182 10*3/uL (ref 150–400)
Platelets: 182 10*3/uL (ref 150–400)
Platelets: 183 10*3/uL (ref 150–400)
Platelets: 197 10*3/uL (ref 150–400)
Platelets: 227 10*3/uL (ref 150–400)
RBC: 3.75 MIL/uL — ABNORMAL LOW (ref 3.87–5.11)
RBC: 3.88 MIL/uL (ref 3.87–5.11)
RBC: 4.08 MIL/uL (ref 3.87–5.11)
RBC: 3.08 MIL/uL — ABNORMAL LOW (ref 3.87–5.11)
RBC: 3.17 MIL/uL — ABNORMAL LOW (ref 3.87–5.11)
RBC: 3.37 MIL/uL — ABNORMAL LOW (ref 3.87–5.11)
RBC: 2.76 MIL/uL — ABNORMAL LOW (ref 3.87–5.11)
RBC: 2.84 MIL/uL — ABNORMAL LOW (ref 3.87–5.11)
RBC: 3 MIL/uL — ABNORMAL LOW (ref 3.87–5.11)
RBC: 3.33 MIL/uL — ABNORMAL LOW (ref 3.87–5.11)
RBC: 3.42 MIL/uL — ABNORMAL LOW (ref 3.87–5.11)
RBC: 3.64 MIL/uL — ABNORMAL LOW (ref 3.87–5.11)
RBC: 3.68 MIL/uL — ABNORMAL LOW (ref 3.87–5.11)
RDW: 16.6 % — ABNORMAL HIGH (ref 11.5–15.5)
RDW: 16.8 % — ABNORMAL HIGH (ref 11.5–15.5)
RDW: 17 % — ABNORMAL HIGH (ref 11.5–15.5)
RDW: 14.7 % (ref 11.5–15.5)
RDW: 14.9 % (ref 11.5–15.5)
RDW: 15.2 % (ref 11.5–15.5)
RDW: 14.3 % (ref 11.5–15.5)
RDW: 14.6 % (ref 11.5–15.5)
RDW: 14.7 % (ref 11.5–15.5)
RDW: 15.1 % (ref 11.5–15.5)
RDW: 15.2 % (ref 11.5–15.5)
RDW: 15.3 % (ref 11.5–15.5)
RDW: 15.4 % (ref 11.5–15.5)
WBC: 5.8 10*3/uL (ref 4.0–10.5)
WBC: 5.8 10*3/uL (ref 4.0–10.5)
WBC: 7.2 10*3/uL (ref 4.0–10.5)
WBC: 6.2 10*3/uL (ref 4.0–10.5)
WBC: 6.3 10*3/uL (ref 4.0–10.5)
WBC: 6.5 10*3/uL (ref 4.0–10.5)
WBC: 4.9 10*3/uL (ref 4.0–10.5)
WBC: 5.3 10*3/uL (ref 4.0–10.5)
WBC: 5.6 10*3/uL (ref 4.0–10.5)
WBC: 5.7 10*3/uL (ref 4.0–10.5)
WBC: 6.2 10*3/uL (ref 4.0–10.5)
WBC: 6.8 10*3/uL (ref 4.0–10.5)
WBC: 6.8 10*3/uL (ref 4.0–10.5)

## 2010-12-05 LAB — HEMOGLOBIN A1C
Hgb A1c MFr Bld: 5.4 % (ref <5.7)
Hgb A1c MFr Bld: 5.5 % (ref <5.7)
Mean Plasma Glucose: 108 mg/dL (ref <117)
Mean Plasma Glucose: 111 mg/dL (ref <117)

## 2010-12-05 LAB — SEDIMENTATION RATE: Sed Rate: 2 mm/hr (ref 0–22)

## 2010-12-05 LAB — DIFFERENTIAL
Basophils Absolute: 0 10*3/uL (ref 0.0–0.1)
Basophils Absolute: 0 10*3/uL (ref 0.0–0.1)
Basophils Relative: 1 % (ref 0–1)
Basophils Relative: 1 % (ref 0–1)
Eosinophils Absolute: 0.3 10*3/uL (ref 0.0–0.7)
Eosinophils Absolute: 0.4 10*3/uL (ref 0.0–0.7)
Eosinophils Relative: 5 % (ref 0–5)
Eosinophils Relative: 7 % — ABNORMAL HIGH (ref 0–5)
Lymphocytes Relative: 41 % (ref 12–46)
Lymphocytes Relative: 59 % — ABNORMAL HIGH (ref 12–46)
Lymphs Abs: 2.4 10*3/uL (ref 0.7–4.0)
Lymphs Abs: 4.3 10*3/uL — ABNORMAL HIGH (ref 0.7–4.0)
Monocytes Absolute: 0.5 10*3/uL (ref 0.1–1.0)
Monocytes Absolute: 0.6 10*3/uL (ref 0.1–1.0)
Monocytes Relative: 8 % (ref 3–12)
Monocytes Relative: 9 % (ref 3–12)
Neutro Abs: 2 10*3/uL (ref 1.7–7.7)
Neutro Abs: 2.4 10*3/uL (ref 1.7–7.7)
Neutrophils Relative %: 27 % — ABNORMAL LOW (ref 43–77)
Neutrophils Relative %: 42 % — ABNORMAL LOW (ref 43–77)

## 2010-12-05 LAB — PREPARE RBC (CROSSMATCH)

## 2010-12-05 LAB — T4, FREE: Free T4: 1.17 ng/dL (ref 0.80–1.80)

## 2010-12-05 LAB — PHOSPHORUS
Phosphorus: 3.2 mg/dL (ref 2.3–4.6)
Phosphorus: 3.3 mg/dL (ref 2.3–4.6)
Phosphorus: 4.1 mg/dL (ref 2.3–4.6)
Phosphorus: 4.2 mg/dL (ref 2.3–4.6)
Phosphorus: 5.1 mg/dL — ABNORMAL HIGH (ref 2.3–4.6)

## 2010-12-05 LAB — URINALYSIS, ROUTINE W REFLEX MICROSCOPIC
Bilirubin Urine: NEGATIVE
Glucose, UA: 100 mg/dL — AB
Hgb urine dipstick: NEGATIVE
Ketones, ur: NEGATIVE mg/dL
Leukocytes, UA: NEGATIVE
Nitrite: NEGATIVE
Protein, ur: >300 mg/dL — AB
Specific Gravity, Urine: 1.01 (ref 1.005–1.030)
Urobilinogen, UA: 0.2 mg/dL (ref 0.0–1.0)
pH: 8.5 — ABNORMAL HIGH (ref 5.0–8.0)

## 2010-12-05 LAB — C-REACTIVE PROTEIN: CRP: 0.4 mg/dL — ABNORMAL LOW (ref <0.6)

## 2010-12-05 LAB — URINE MICROSCOPIC-ADD ON

## 2010-12-05 LAB — ABO/RH: ABO/RH(D): O POS

## 2010-12-05 LAB — TSH
TSH: 0.764 u[IU]/mL (ref 0.350–4.500)
TSH: 1.561 u[IU]/mL (ref 0.350–4.500)

## 2010-12-05 LAB — PREALBUMIN: Prealbumin: 17.8 mg/dL — ABNORMAL LOW (ref 18.0–45.0)

## 2010-12-05 LAB — VITAMIN D 25 HYDROXY (VIT D DEFICIENCY, FRACTURES): Vit D, 25-Hydroxy: 31 ng/mL (ref 30–89)

## 2010-12-05 LAB — PROCALCITONIN: Procalcitonin: 0.18 ng/mL

## 2010-12-21 ENCOUNTER — Emergency Department: Payer: Self-pay | Admitting: Emergency Medicine

## 2010-12-25 ENCOUNTER — Emergency Department: Payer: Self-pay | Admitting: Emergency Medicine

## 2010-12-26 ENCOUNTER — Emergency Department: Payer: Self-pay | Admitting: Emergency Medicine

## 2010-12-29 ENCOUNTER — Emergency Department: Payer: Self-pay | Admitting: Unknown Physician Specialty

## 2010-12-31 ENCOUNTER — Emergency Department: Payer: Self-pay | Admitting: Internal Medicine

## 2011-03-09 ENCOUNTER — Inpatient Hospital Stay: Payer: Self-pay | Admitting: Internal Medicine

## 2011-03-15 IMAGING — CR DG ABDOMEN 3V
1 series · 4 of 4 positions shown · non-contrast
Comparison: none

REASON FOR EXAM: Pain
COMMENTS:   May transport without cardiac monitor

PROCEDURE:     DXR - DXR ABDOMEN 3-WAY (INCL PA CXR)  - November 07, 2010  [DATE]
RESULT:     Comparisons:  None

[Series 1: view not recorded · 0.17mm/px · 4 of 4 slices shown]
[im 1/4]
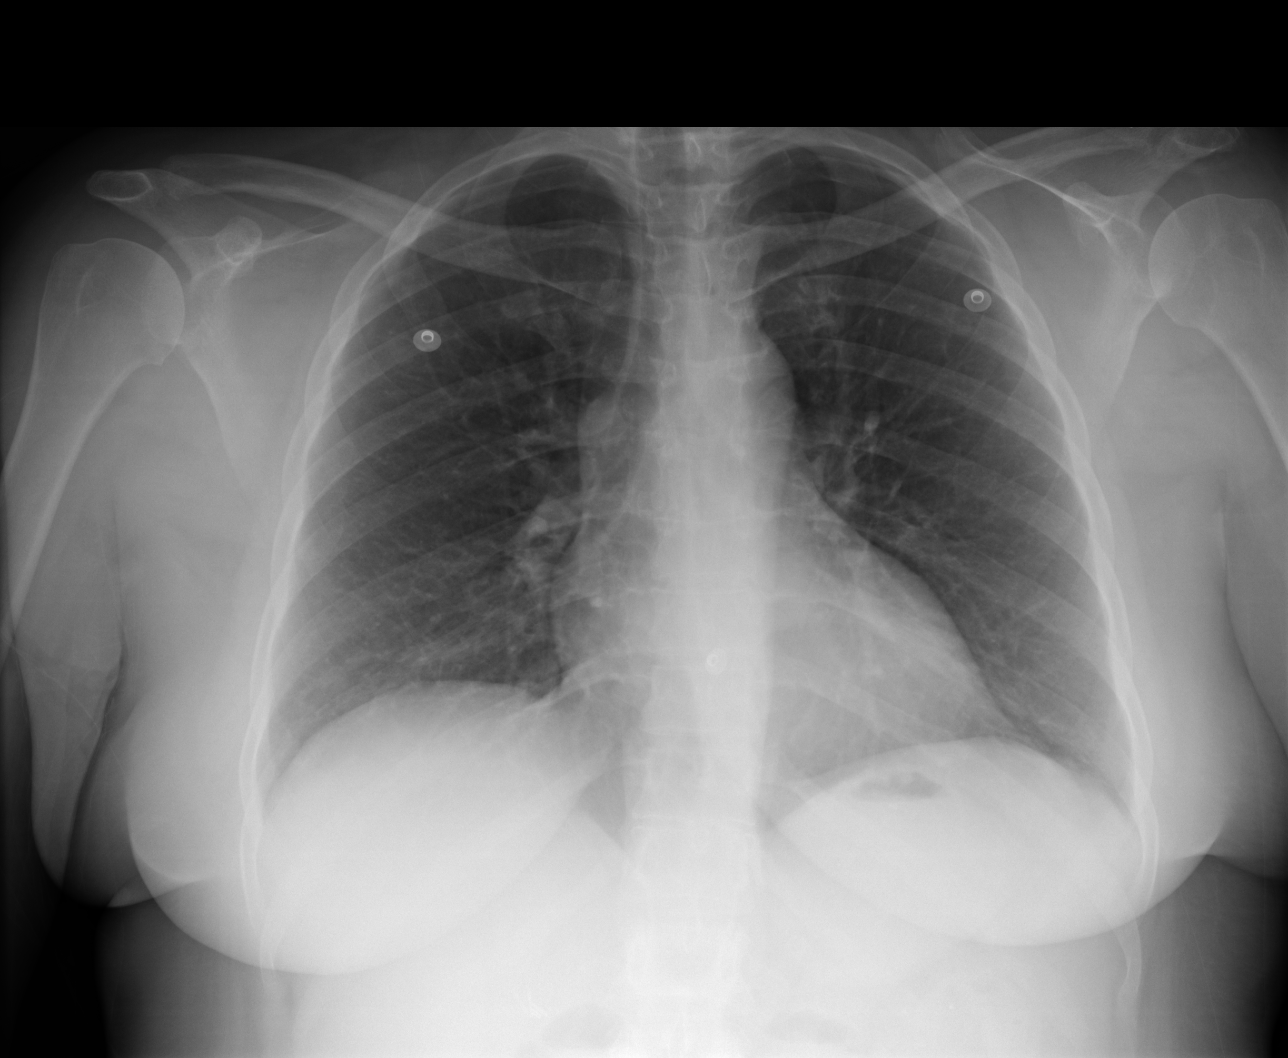
[im 2/4]
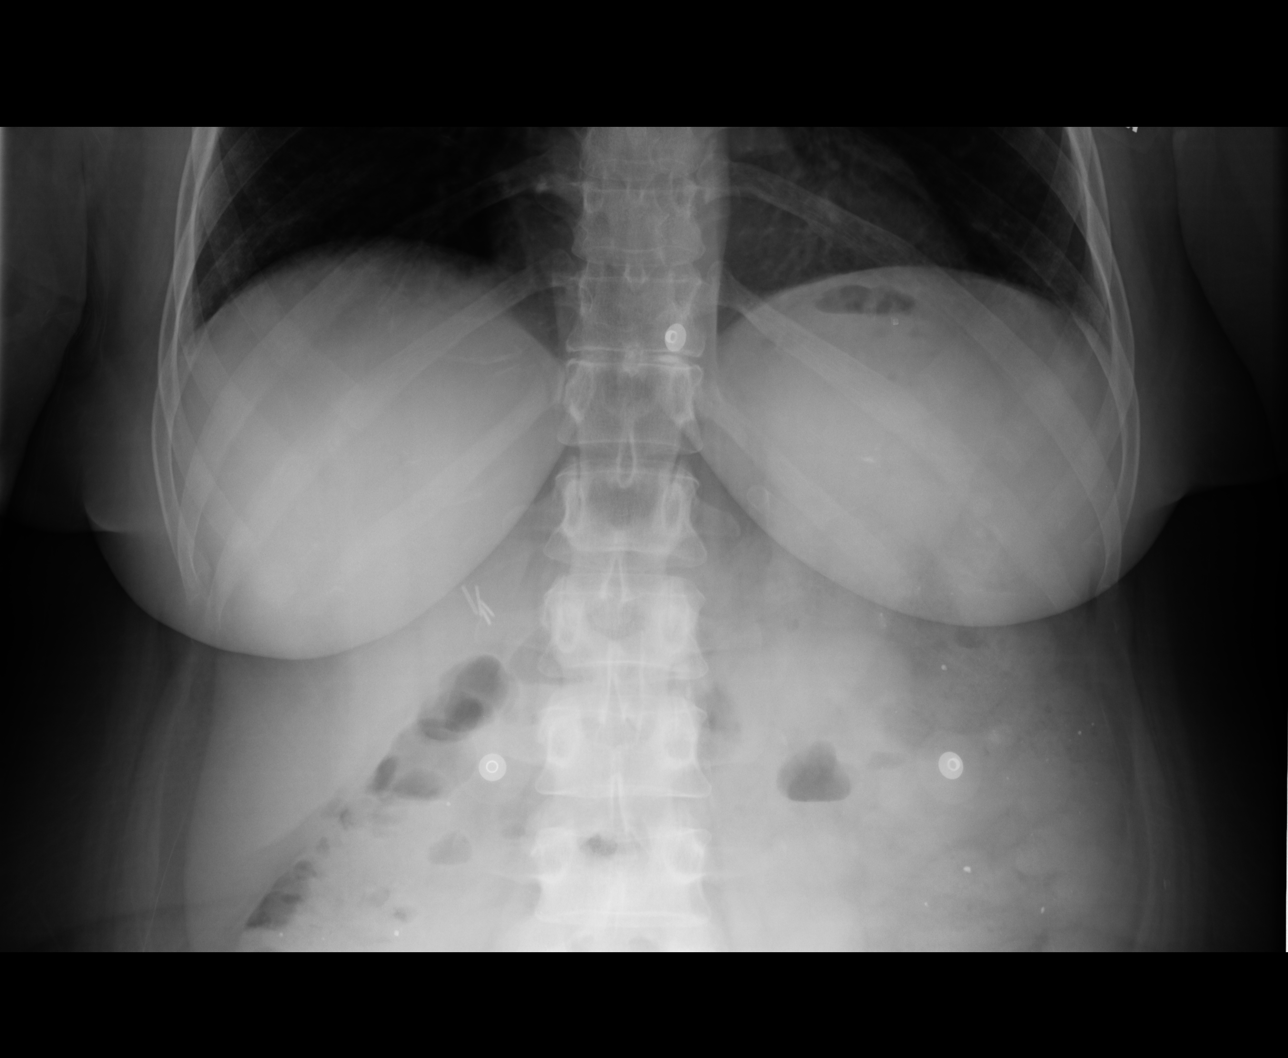
[im 3/4]
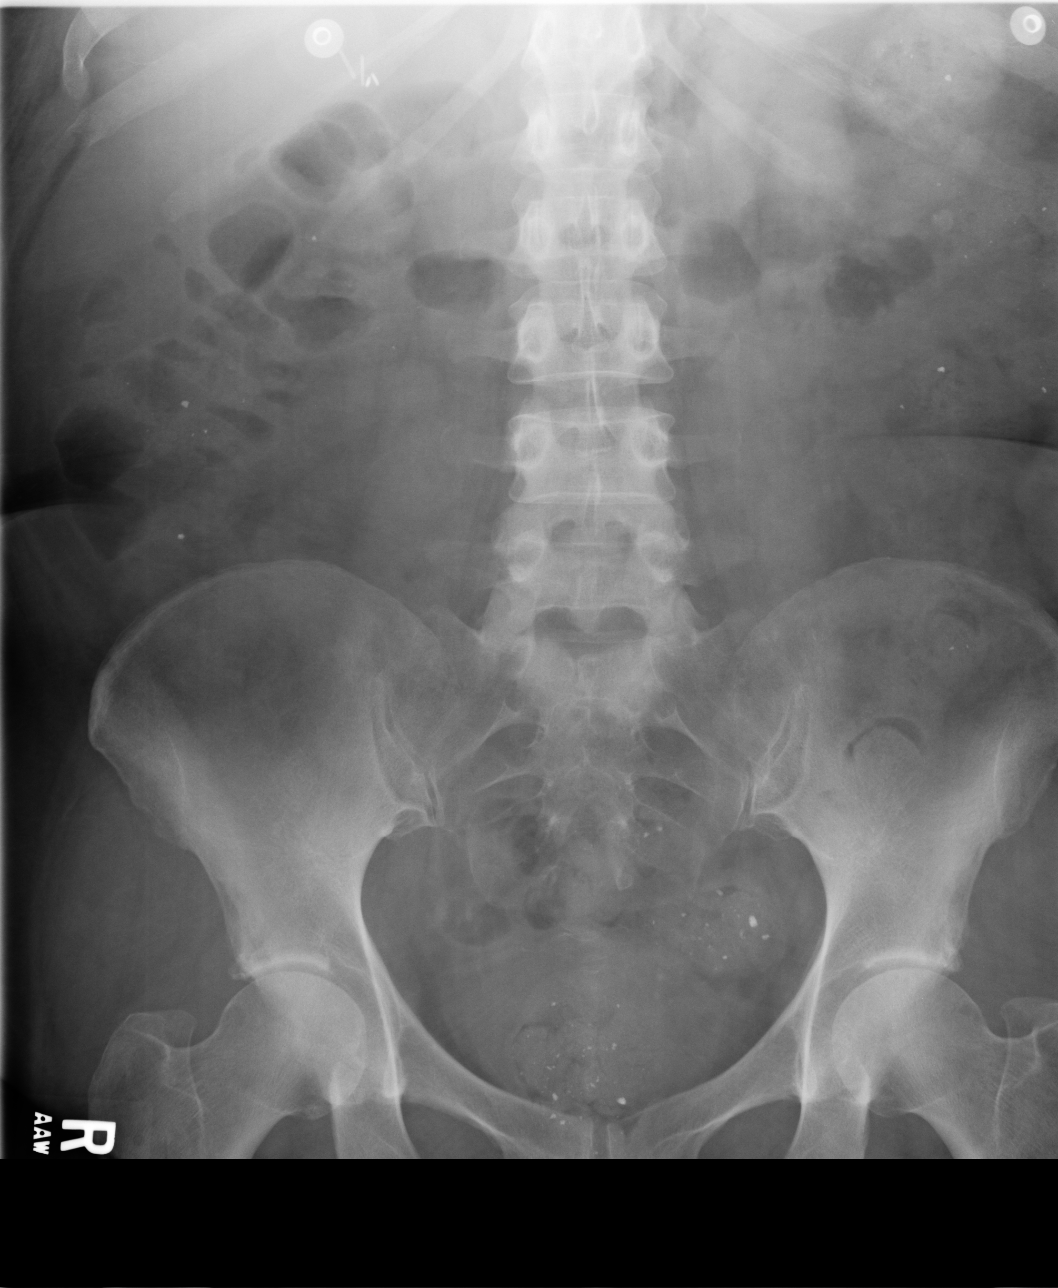
[im 4/4]
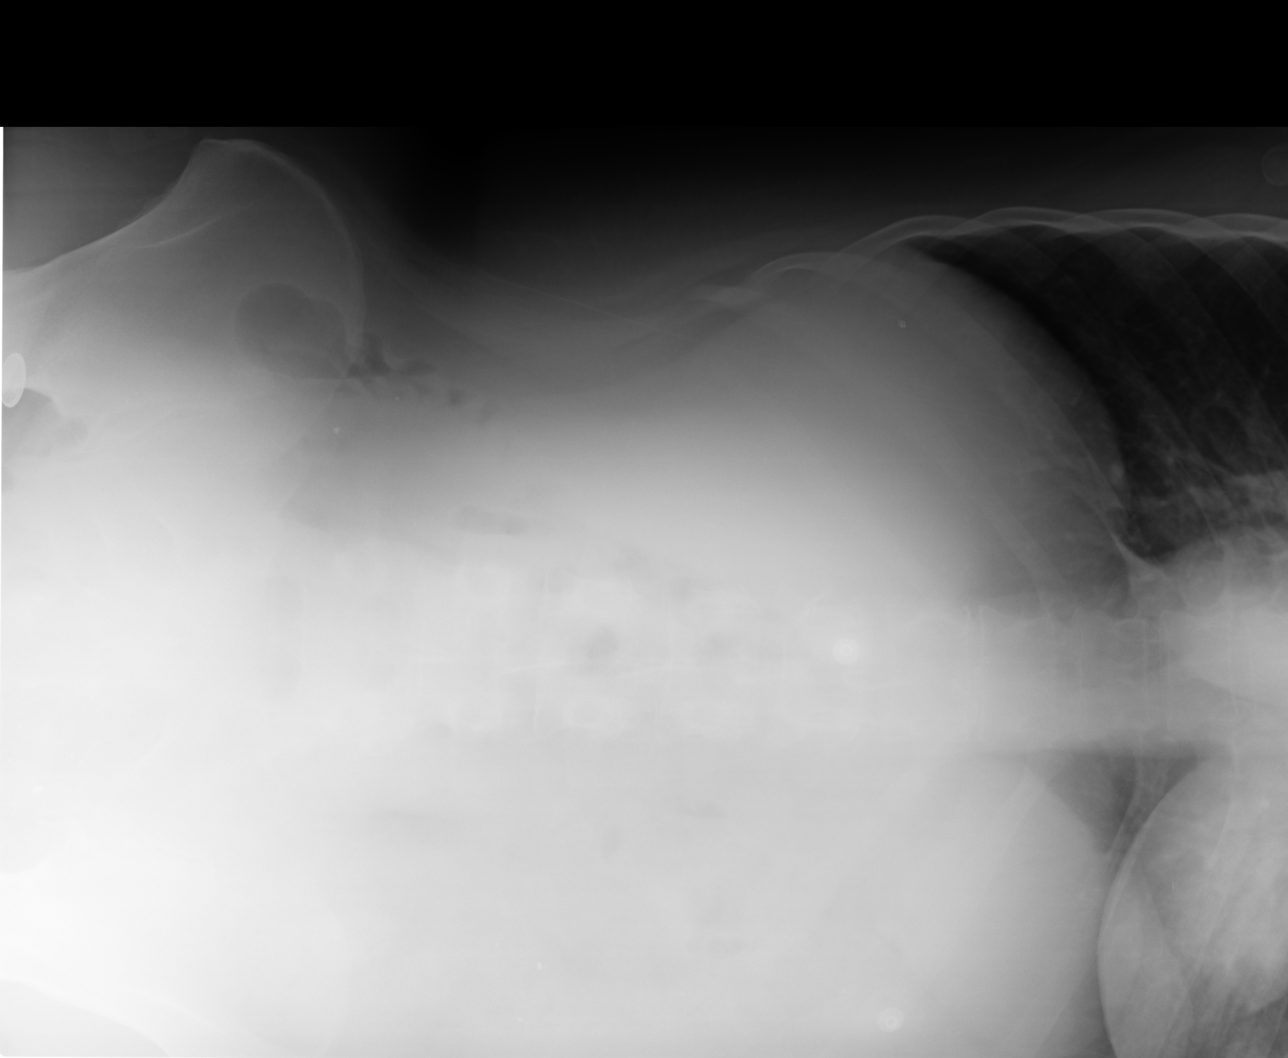

[4 of 4 positions shown; findings below may reference images not displayed]

FINDINGS: PA chest and supine, upright, and left lateral decubitus views of the
abdomen are provided.

The chest is clear. The heart and mediastinum are unremarkable. The osseous
structures are unremarkable.

There is a nonspecific bowel gas pattern. There is no bowel dilatation to
suggest obstruction. There are no air-fluid levels. There are no dilated
loops, bowel wall thickening, or evidence of mass effect.  Soft tissue
shadows are normal. There is no evidence of pneumoperitoneum, portal venous
gas, or pneumatosis.

Osseous structures are unremarkable.
IMPRESSION: Unremarkable abdominal series.

## 2011-03-19 ENCOUNTER — Emergency Department: Payer: Self-pay | Admitting: Emergency Medicine

## 2011-03-20 ENCOUNTER — Observation Stay: Payer: Self-pay | Admitting: Internal Medicine

## 2011-04-28 IMAGING — CT CT ABD-PELV W/O CM
1 of 2 series · 16 of 32 positions shown, 20 images · non-contrast
Comparison: none

REASON FOR EXAM: (1) severe pain; (2) severe [paion hi cre0at
COMMENTS:

PROCEDURE:     CT  - CT ABDOMEN AND PELVIS W[DATE]  [DATE]
RESULT:     History: Pain.
Impression study: R CT of 09/28/2009.

[Series 2: 3mm soft tissue · axial · 0.73mm/px · z∈[-1042,-622]mm · 16 of 154 slices shown, 20 images]
[im 7/154  soft-tissue]
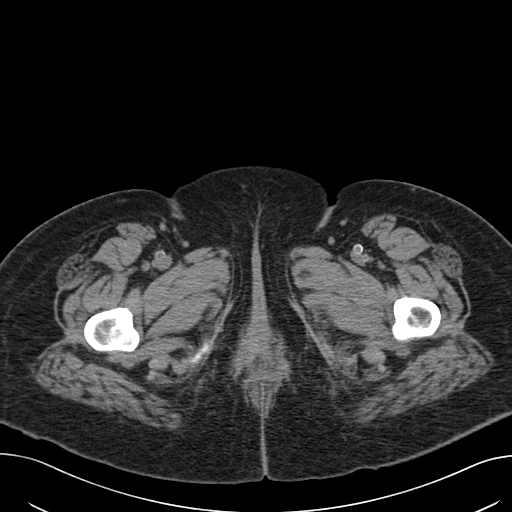
[im 7/154  bone]
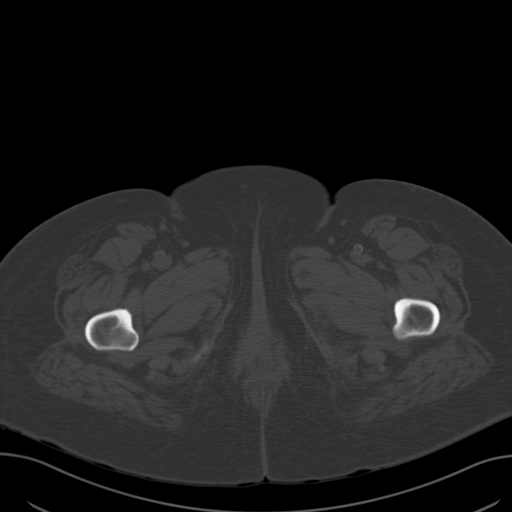
[im 19/154  soft-tissue]
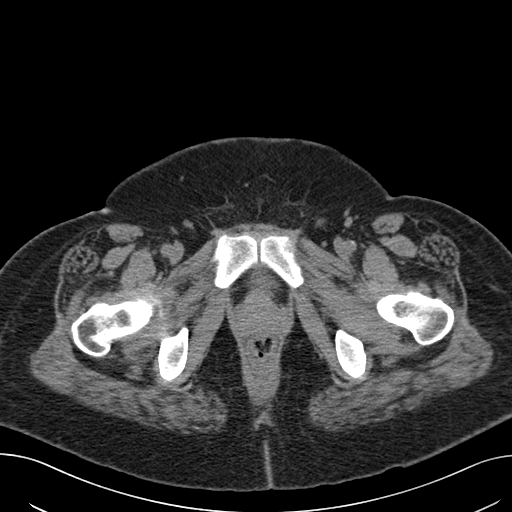
[im 31/154  soft-tissue]
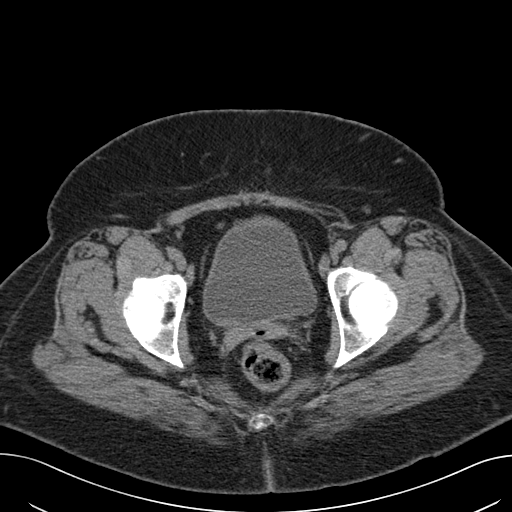
[im 43/154  soft-tissue]
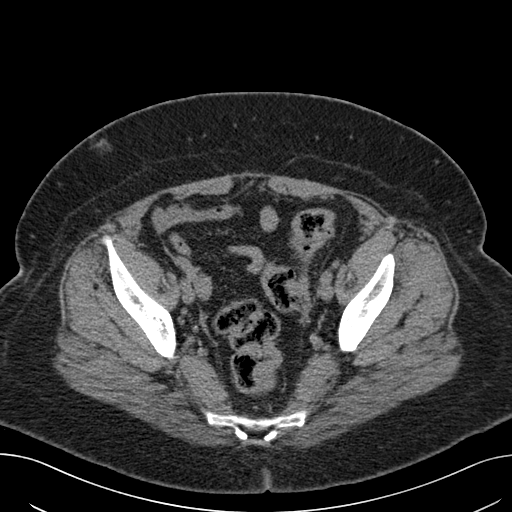
[im 49/154  soft-tissue]
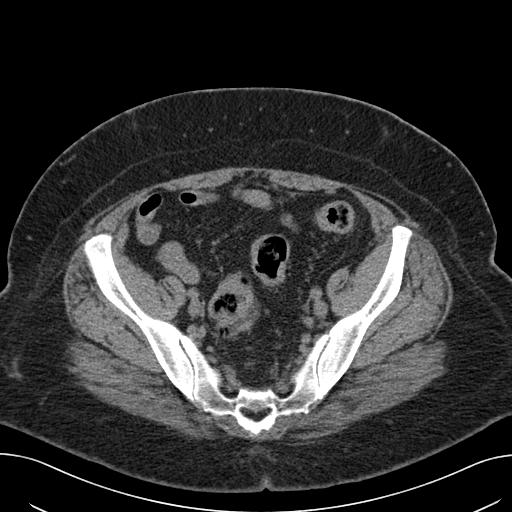
[im 62/154  soft-tissue]
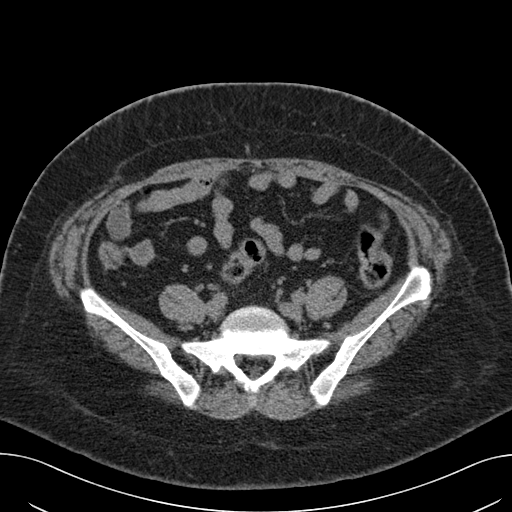
[im 74/154  soft-tissue]
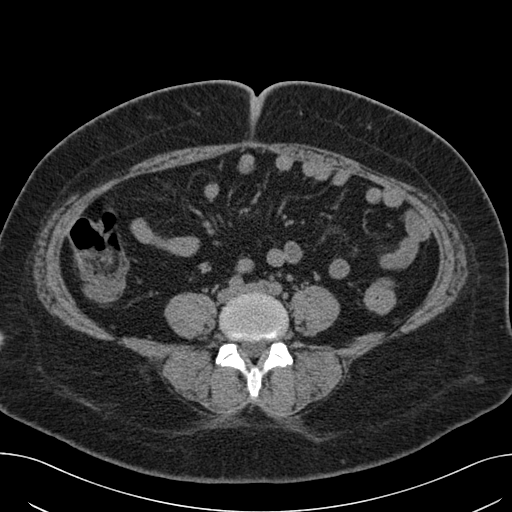
[im 80/154  soft-tissue]
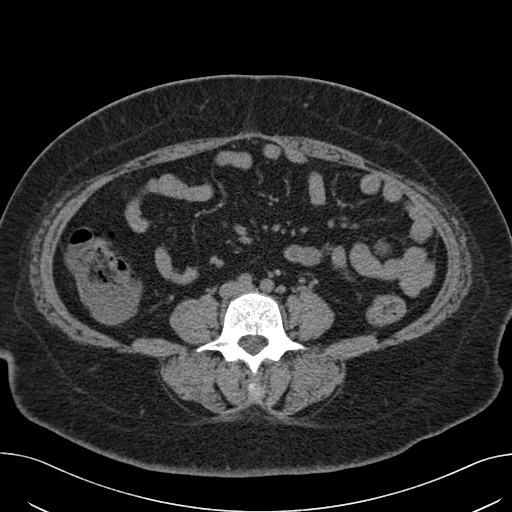
[im 92/154  soft-tissue]
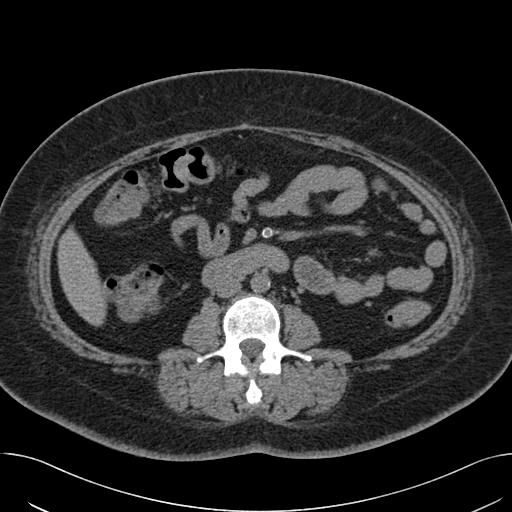
[im 92/154  bone]
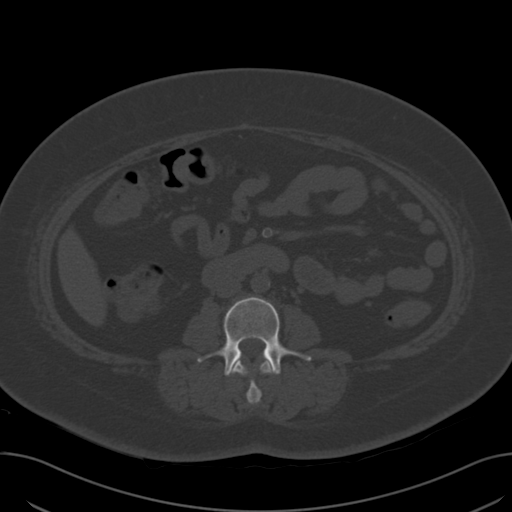
[im 105/154  soft-tissue]
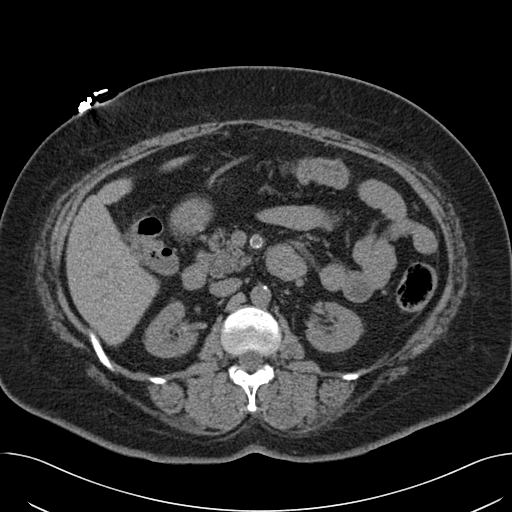
[im 117/154  soft-tissue]
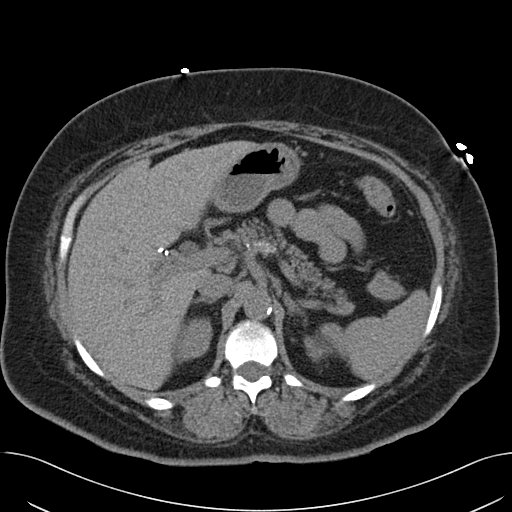
[im 123/154  soft-tissue]
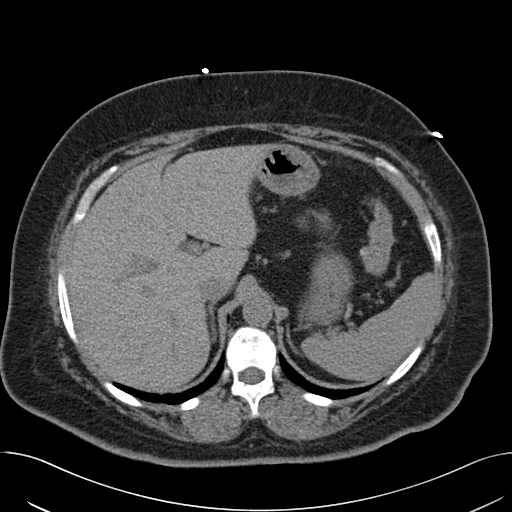
[im 129/154  lung]
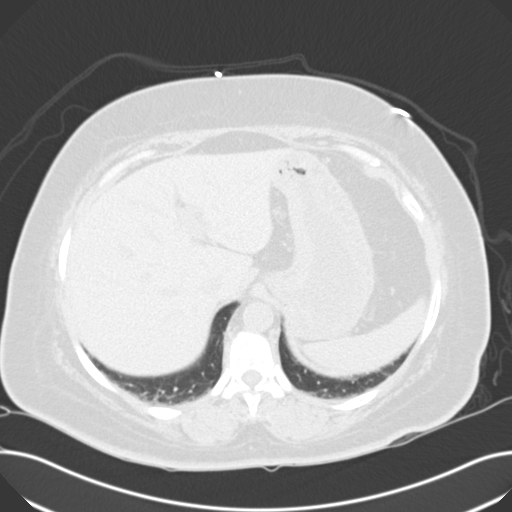
[im 135/154  soft-tissue]
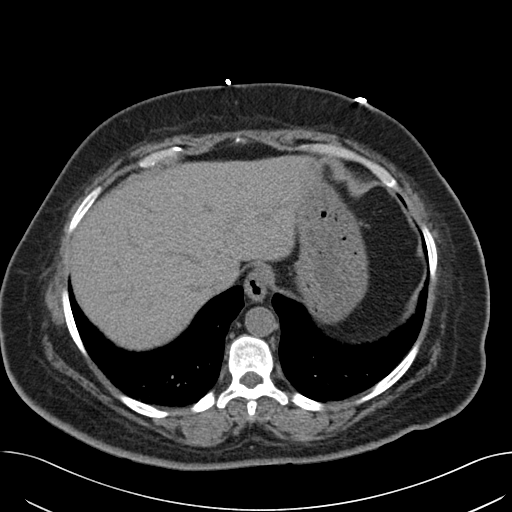
[im 135/154  lung]
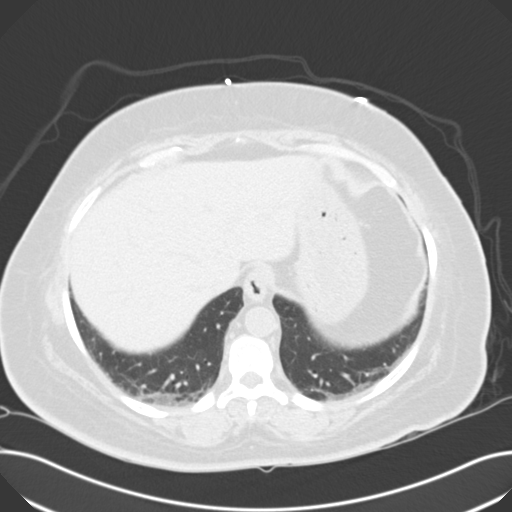
[im 141/154  lung]
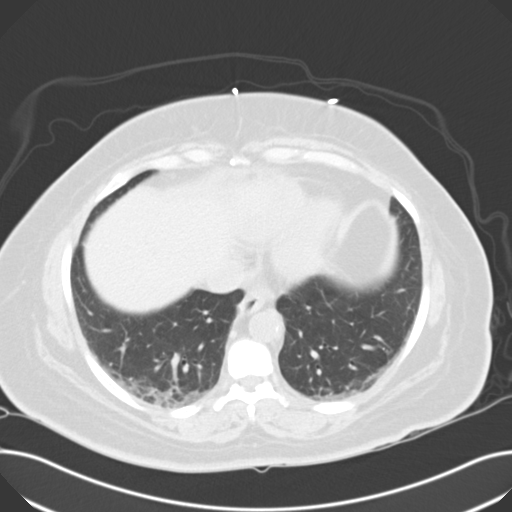
[im 147/154  soft-tissue]
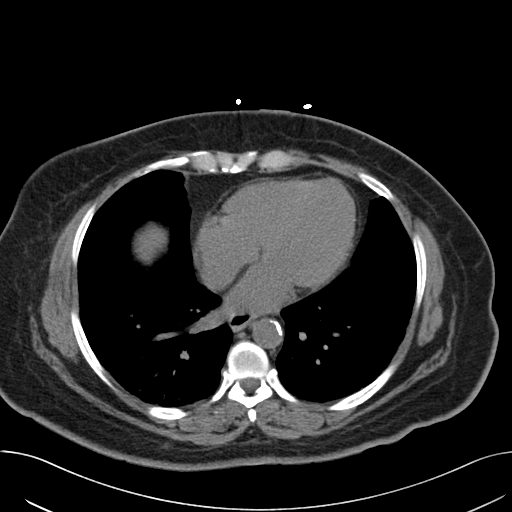
[im 147/154  lung]
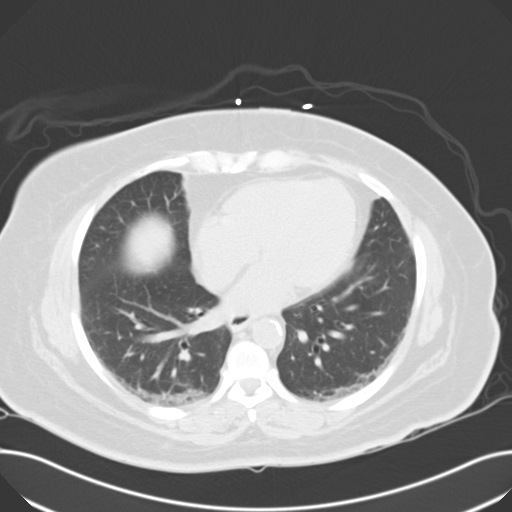

[16 of 32 positions shown; findings below may reference images not displayed]

FINDINGS: Standard  nonenhanced CT obtained. Liver normal. Spleen normal.
Pancreas normal. Prior cholecystectomy. Stable calcification noted in the
pancreas. This may be from chronic pancreatitis. No evidence of acute
pancreatitis. Adrenals normal. No hydronephrosis or hydroureter. Small
phleboliths in pelvis. Bladder nondistended. No free pelvic fluid. No
inflammatory changes noted in the lower quadrants. Mild diverticulosis
sigmoid colon. Aorta nondistended. Mesenteric vascular calcification. No
bowel distention or free air. No pelvic masses. Lung bases demonstrate mild
atelectasis.
IMPRESSION: No acute abnormality.

## 2011-05-02 IMAGING — CR DG ABDOMEN 3V
1 series · 5 of 5 positions shown · non-contrast
Comparison: none

REASON FOR EXAM: Pain
COMMENTS:   May transport without cardiac monitor

[Series 1: view not recorded · 0.17mm/px · 5 of 5 slices shown]
[im 1/5]
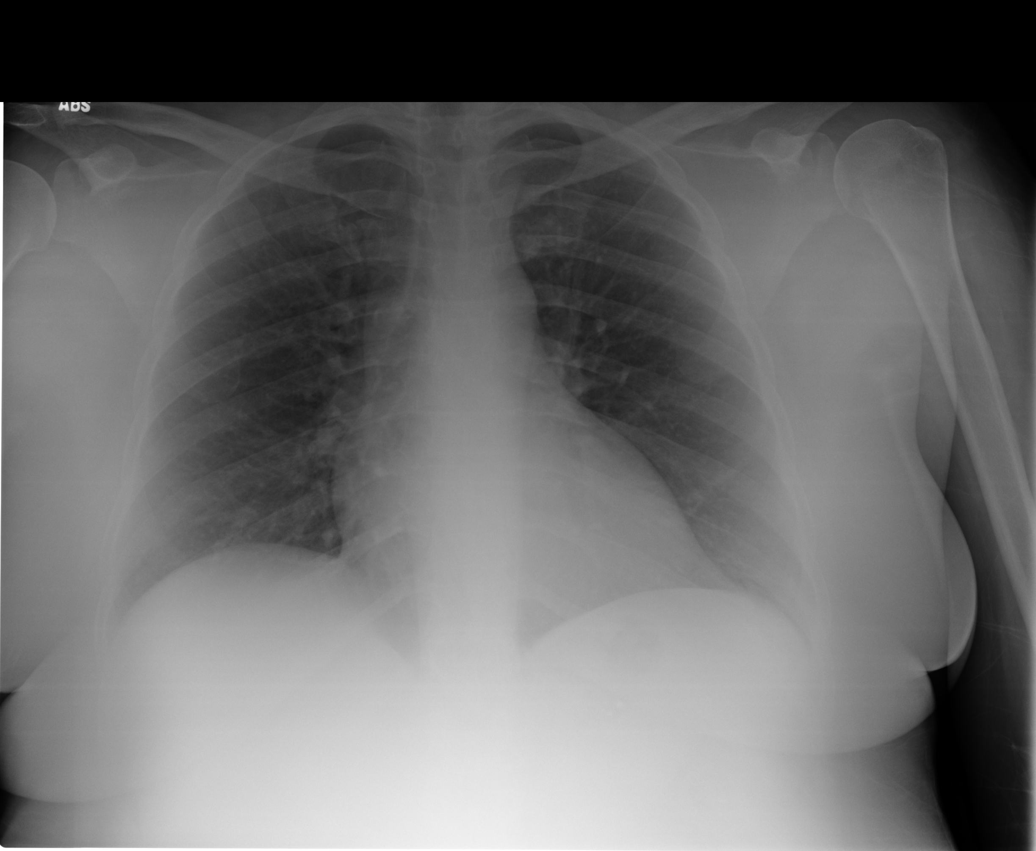
[im 2/5]
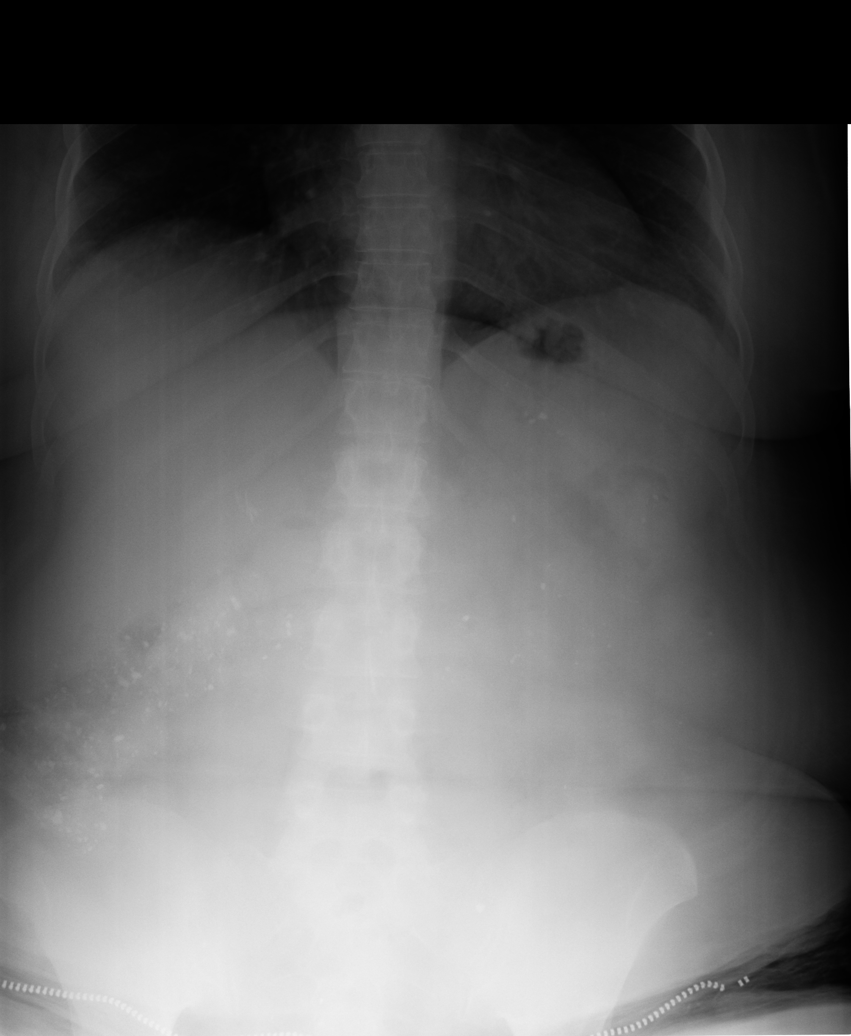
[im 3/5]
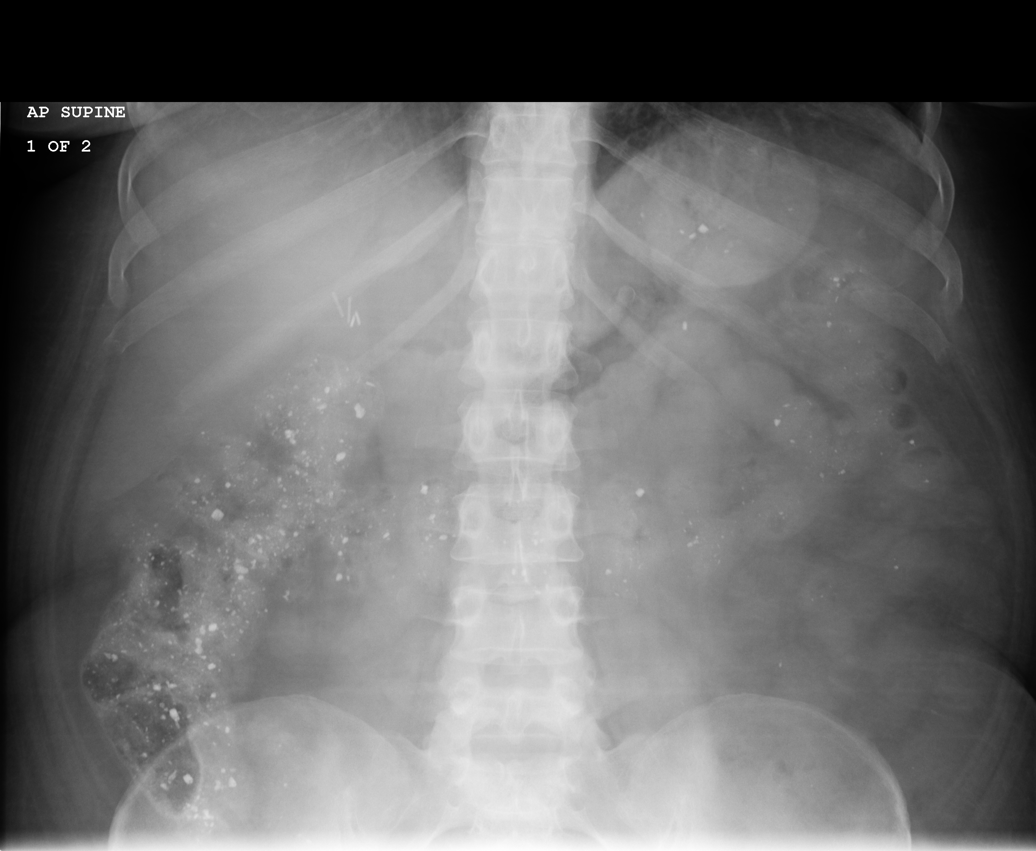
[im 4/5]
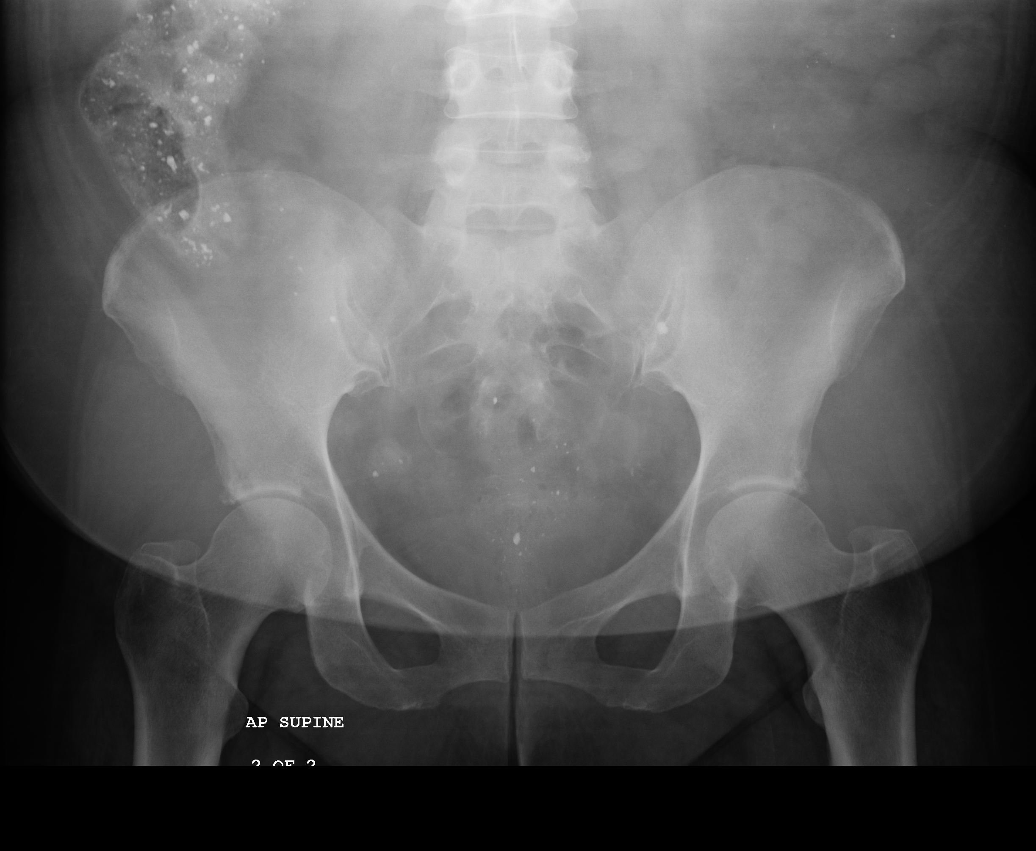
[im 5/5]
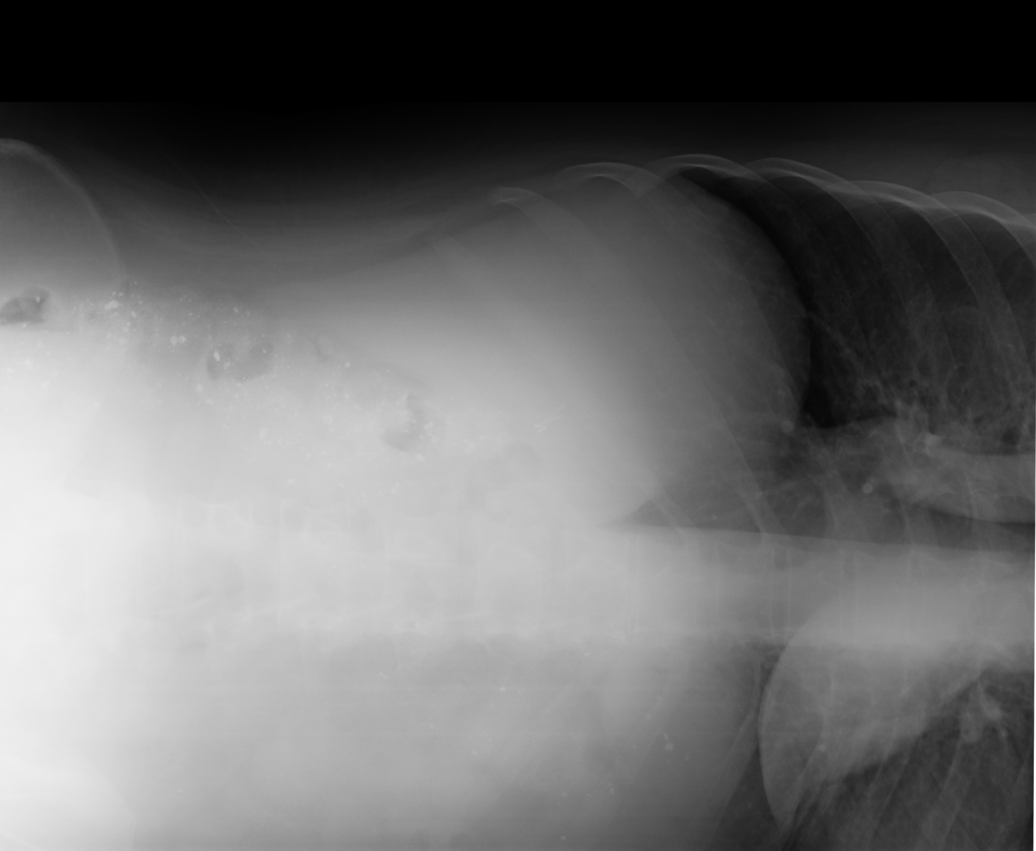

[5 of 5 positions shown; findings below may reference images not displayed]

PROCEDURE:     DXR - DXR ABDOMEN 3-WAY (INCL PA CXR)  - December 25, 2010 [DATE]

RESULT:     PA view of the chest shows the lung fields to be clear. Multiple
views of the abdomen were obtained. No subdiaphragmatic free air is seen.
The bowel gas pattern is normal. There is a small amount of opaque material
in the colon which may represent opaque medications. No dilated bowel loops
are seen. No air-fluid levels are observed in the erect view. No abnormal
intraabdominal calcifications are seen.
IMPRESSION: 1. No bowel obstruction or other acute change is identified.
2. There is opaque material in the colon, likely representing opaque
medication.

## 2011-05-06 IMAGING — CR DG ABDOMEN 3V
1 series · 5 of 5 positions shown · non-contrast
Comparison: none

REASON FOR EXAM: abd pain
COMMENTS:   May transport without cardiac monitor

[Series 1: view not recorded · 0.17mm/px · 5 of 5 slices shown]
[im 1/5]
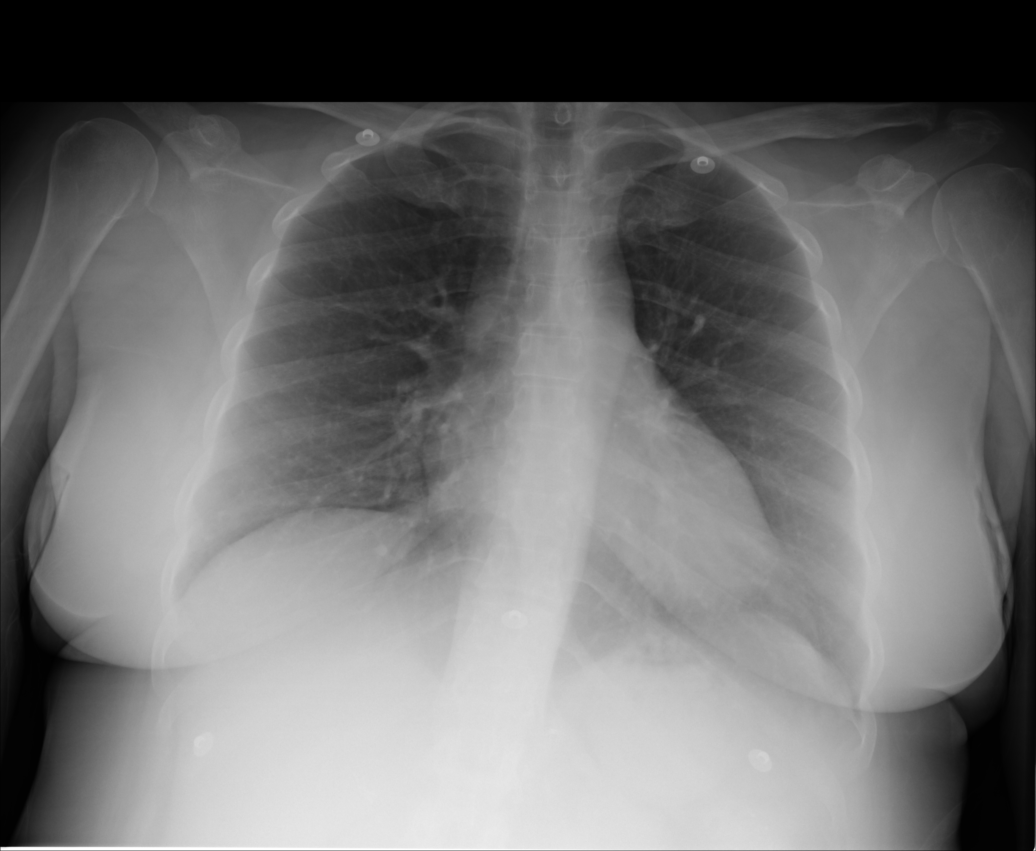
[im 2/5]
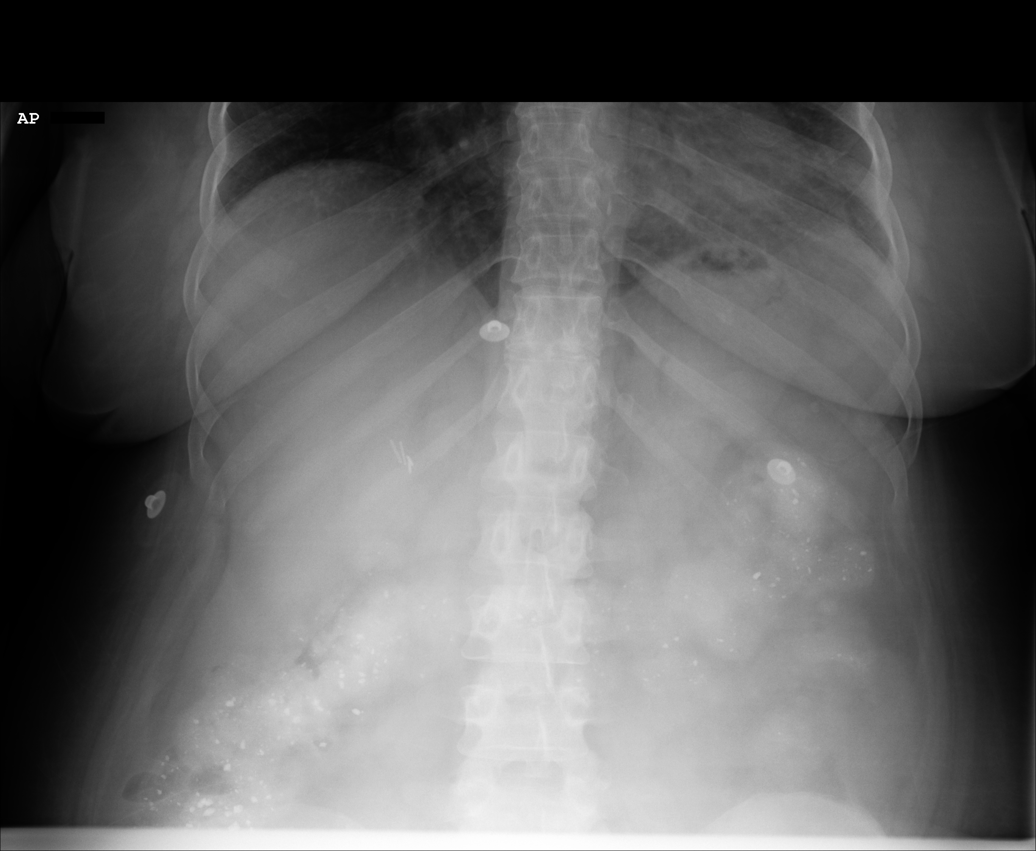
[im 3/5]
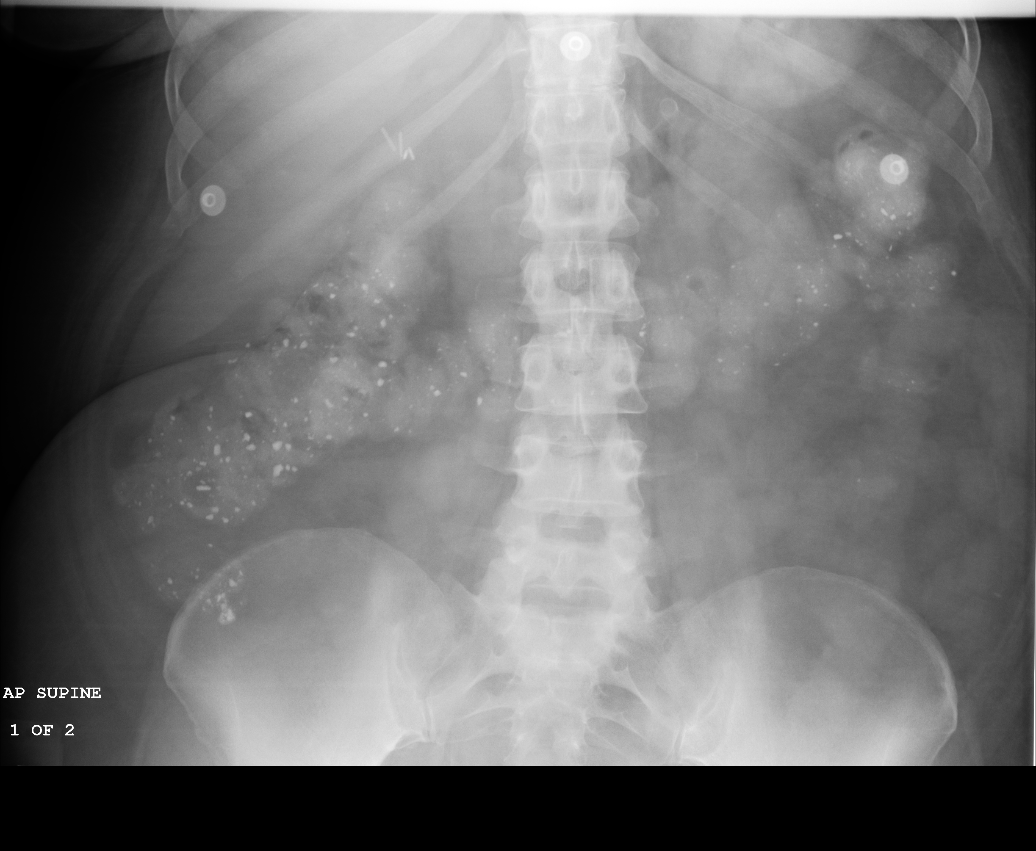
[im 4/5]
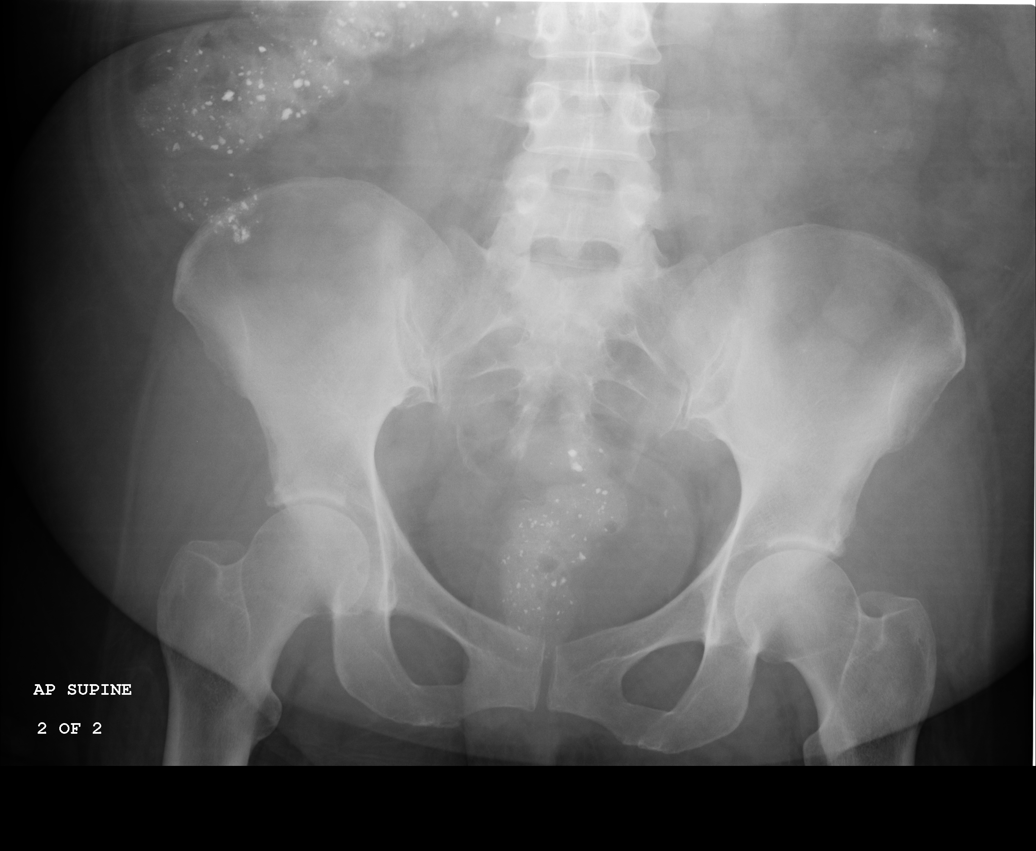
[im 5/5]
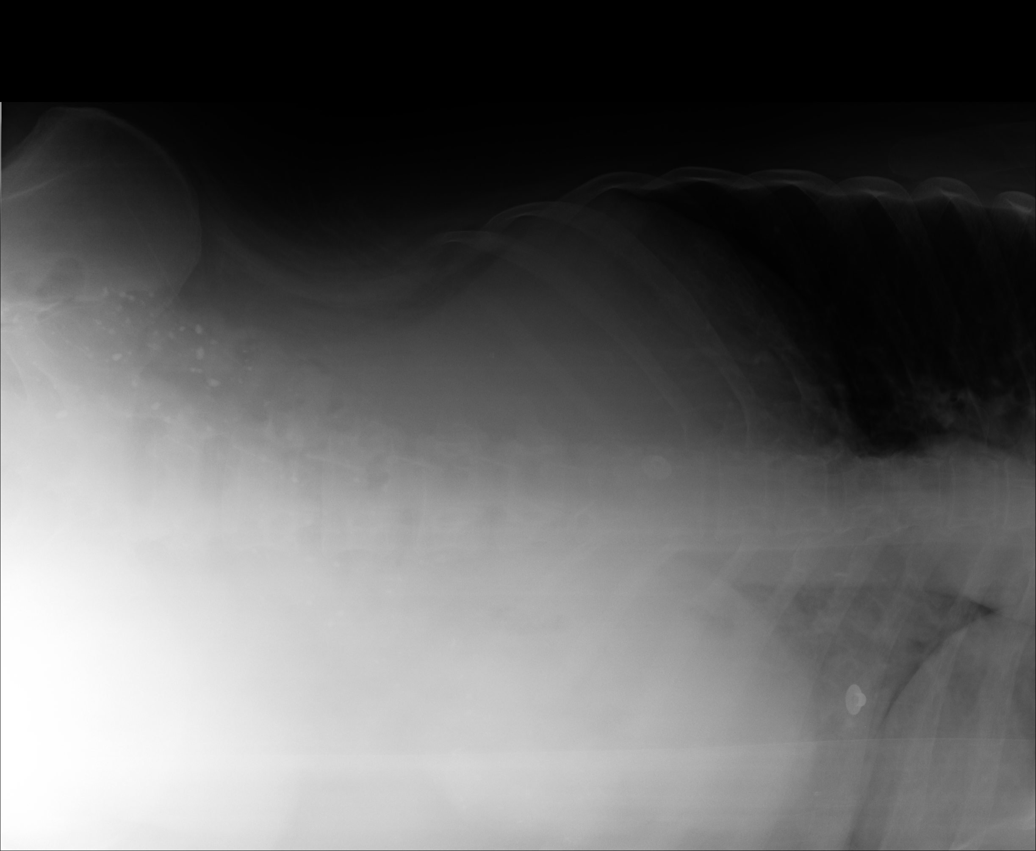

[5 of 5 positions shown; findings below may reference images not displayed]

PROCEDURE:     DXR - DXR ABDOMEN 3-WAY (INCL PA CXR)  - December 29, 2010  [DATE]

RESULT:

Frontal view of the chest demonstrates no evidence of focal infiltrates,
effusions or edema. This was compared to previous study dated 12/25/2010.

Air is seen within nondilated loops of large and small bowel. There is a
paucity of bowel gas. Flocculated barium is appreciated within the colon.
The visualized bony skeleton is unremarkable. A moderate amount of stool is
appreciated within the colon.
IMPRESSION: 1. Nonobstructive bowel gas pattern.

2. Chest radiograph without evidence of acute cardiopulmonary disease.

## 2011-07-15 ENCOUNTER — Ambulatory Visit: Payer: Self-pay | Admitting: Vascular Surgery

## 2011-07-16 IMAGING — CR DG ABDOMEN 1V
1 series · 2 of 2 positions shown · non-contrast
Comparison: none

REASON FOR EXAM: pain in R side of abdomen on palapation, r/o constipation
COMMENTS:

[Series 1: view not recorded · 0.17mm/px · 2 of 2 slices shown]
[im 1/2]
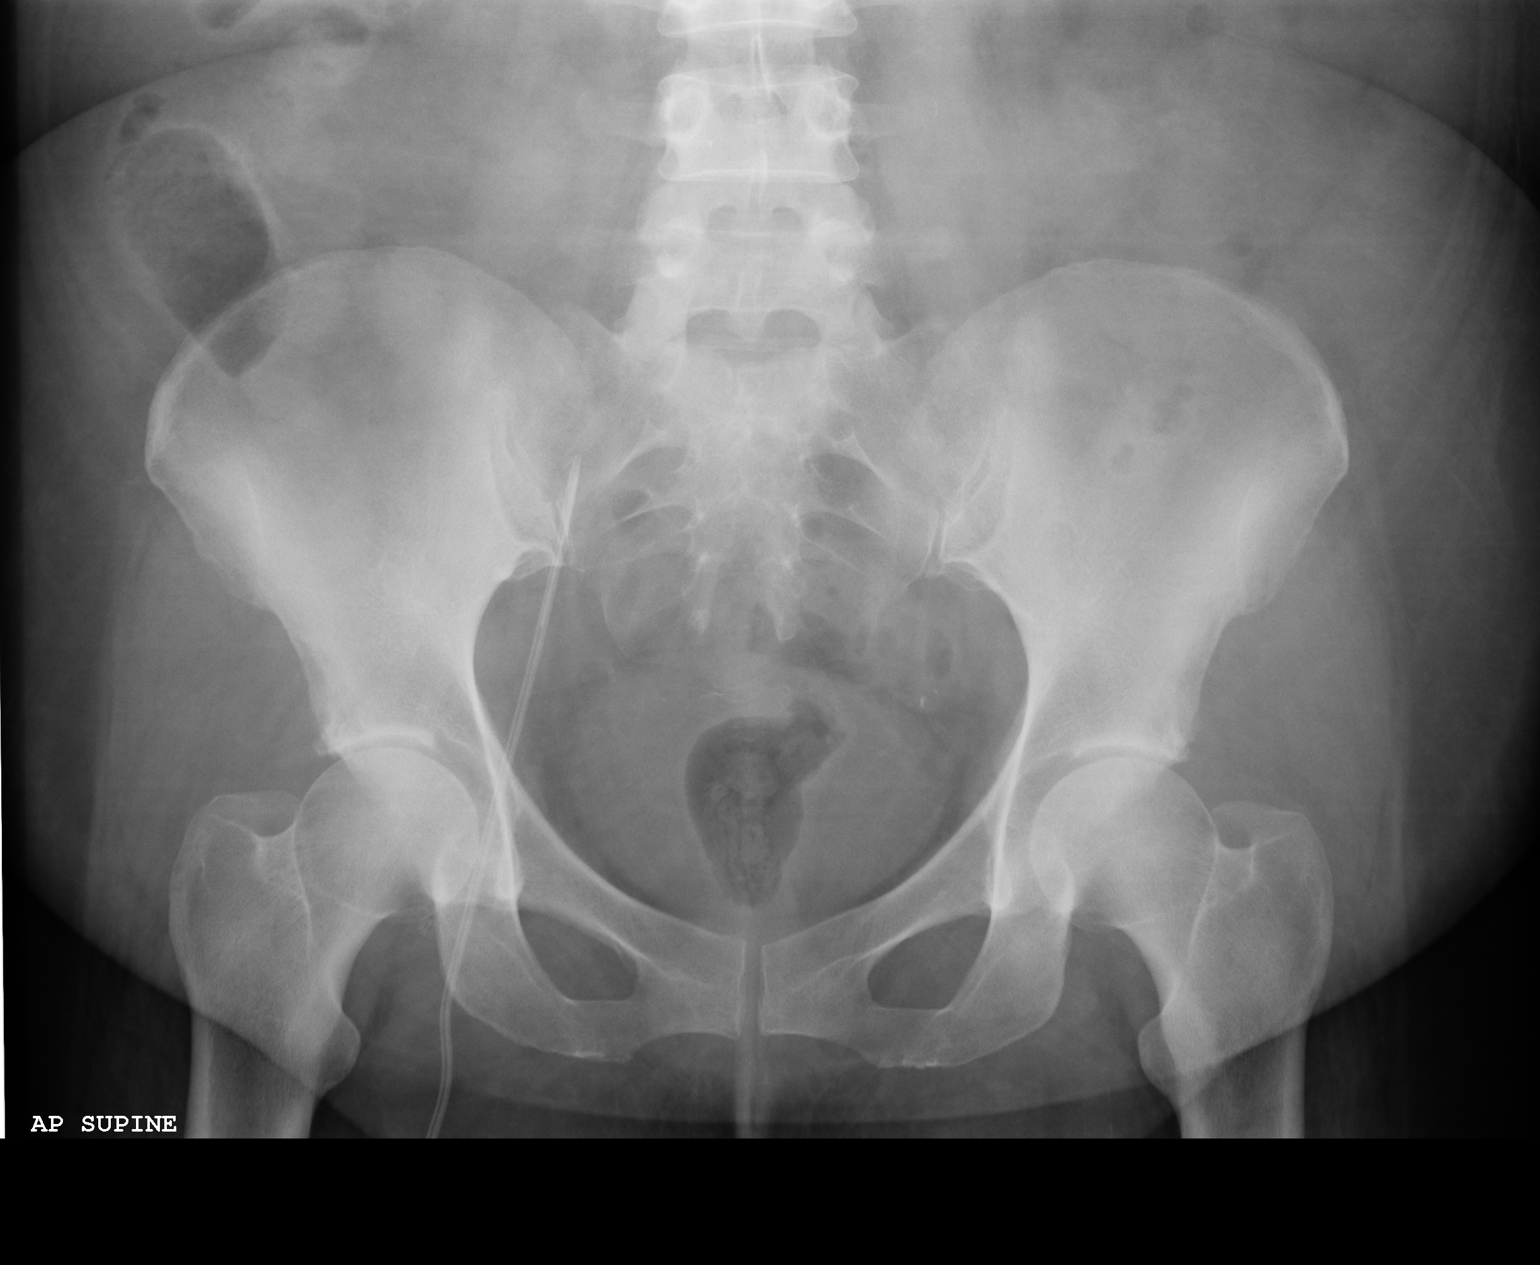
[im 2/2]
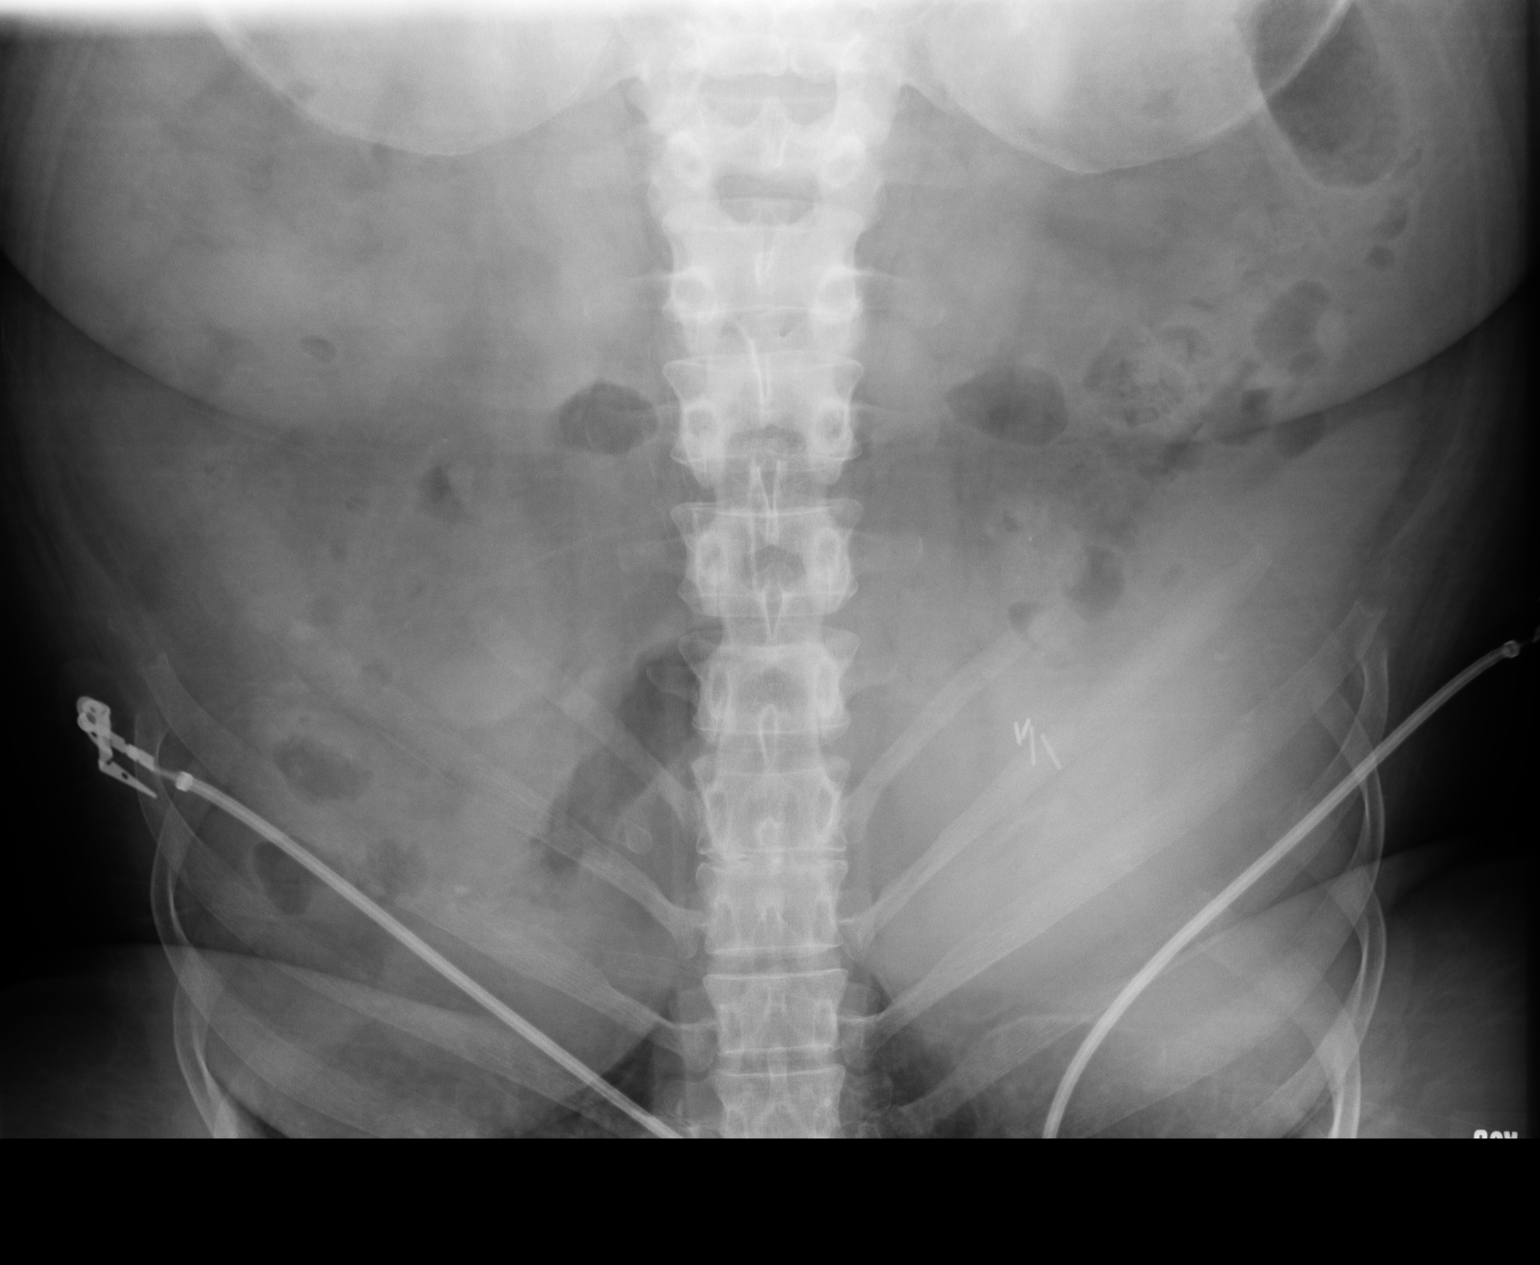

[2 of 2 positions shown; findings below may reference images not displayed]

PROCEDURE:     DXR - DXR KIDNEY URETER BLADDER  - March 10, 2011  [DATE]

RESULT:     An AP view of the abdomen is compared to a prior exam of
01/26/2010. No dilated bowel loops suspicious for bowel obstruction are seen.
No definitely abnormal intraabdominal calcifications are observed. No acute
bony abnormalities are seen. There are postoperative metallic clips in the
region of the gallbladder bed. There is a catheter projected over the right
iliac region.
IMPRESSION: 1.     No acute changes are identified.

## 2011-07-25 IMAGING — CR DG CHEST 2V
1 series · 2 of 2 positions shown · non-contrast
Comparison: none

REASON FOR EXAM: cough
COMMENTS:

PROCEDURE:     DXR - DXR CHEST PA (OR AP) AND LATERAL  - March 19, 2011  [DATE]
RESULT:     Comparison is made to the prior exam of 03/10/2011. The lung
fields are clear of infiltrate. Heart size is normal. The mediastinal and
osseous structures show no significant abnormalities.

[Series 1: view not recorded · 0.17mm/px · 2 of 2 slices shown]
[im 1/2]
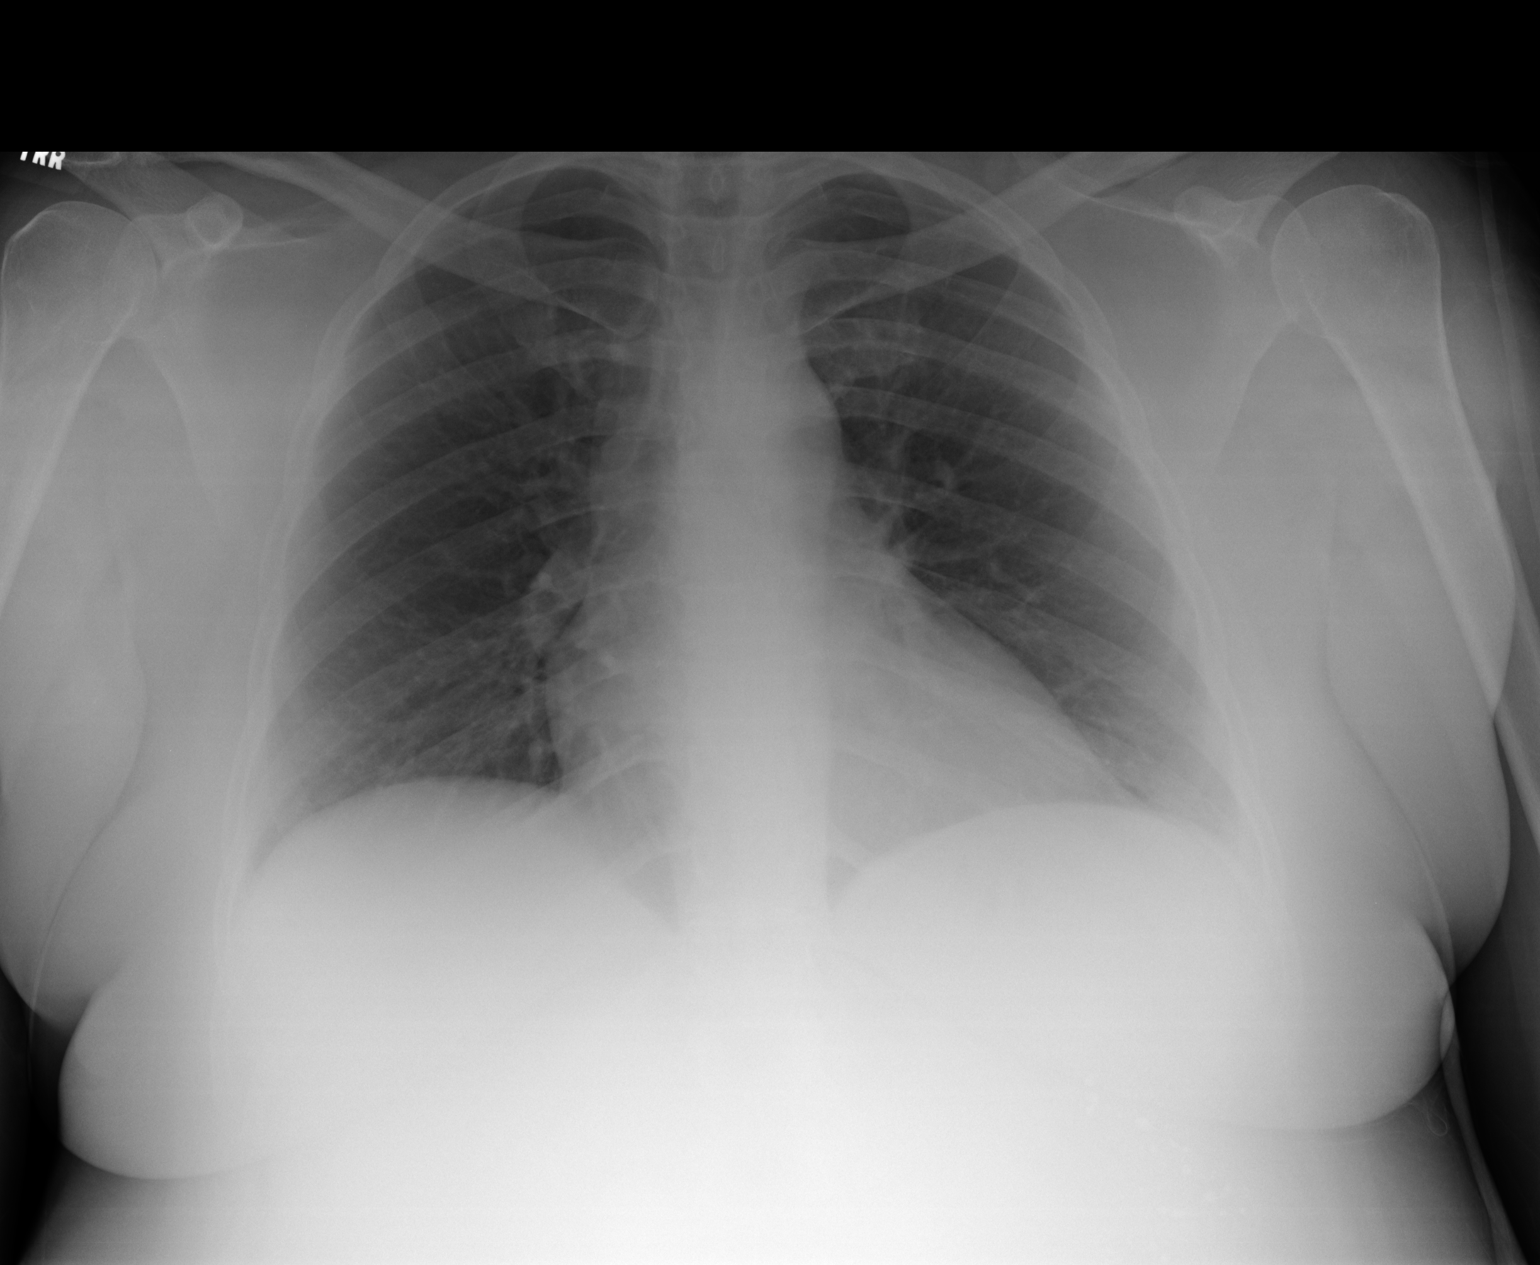
[im 2/2]
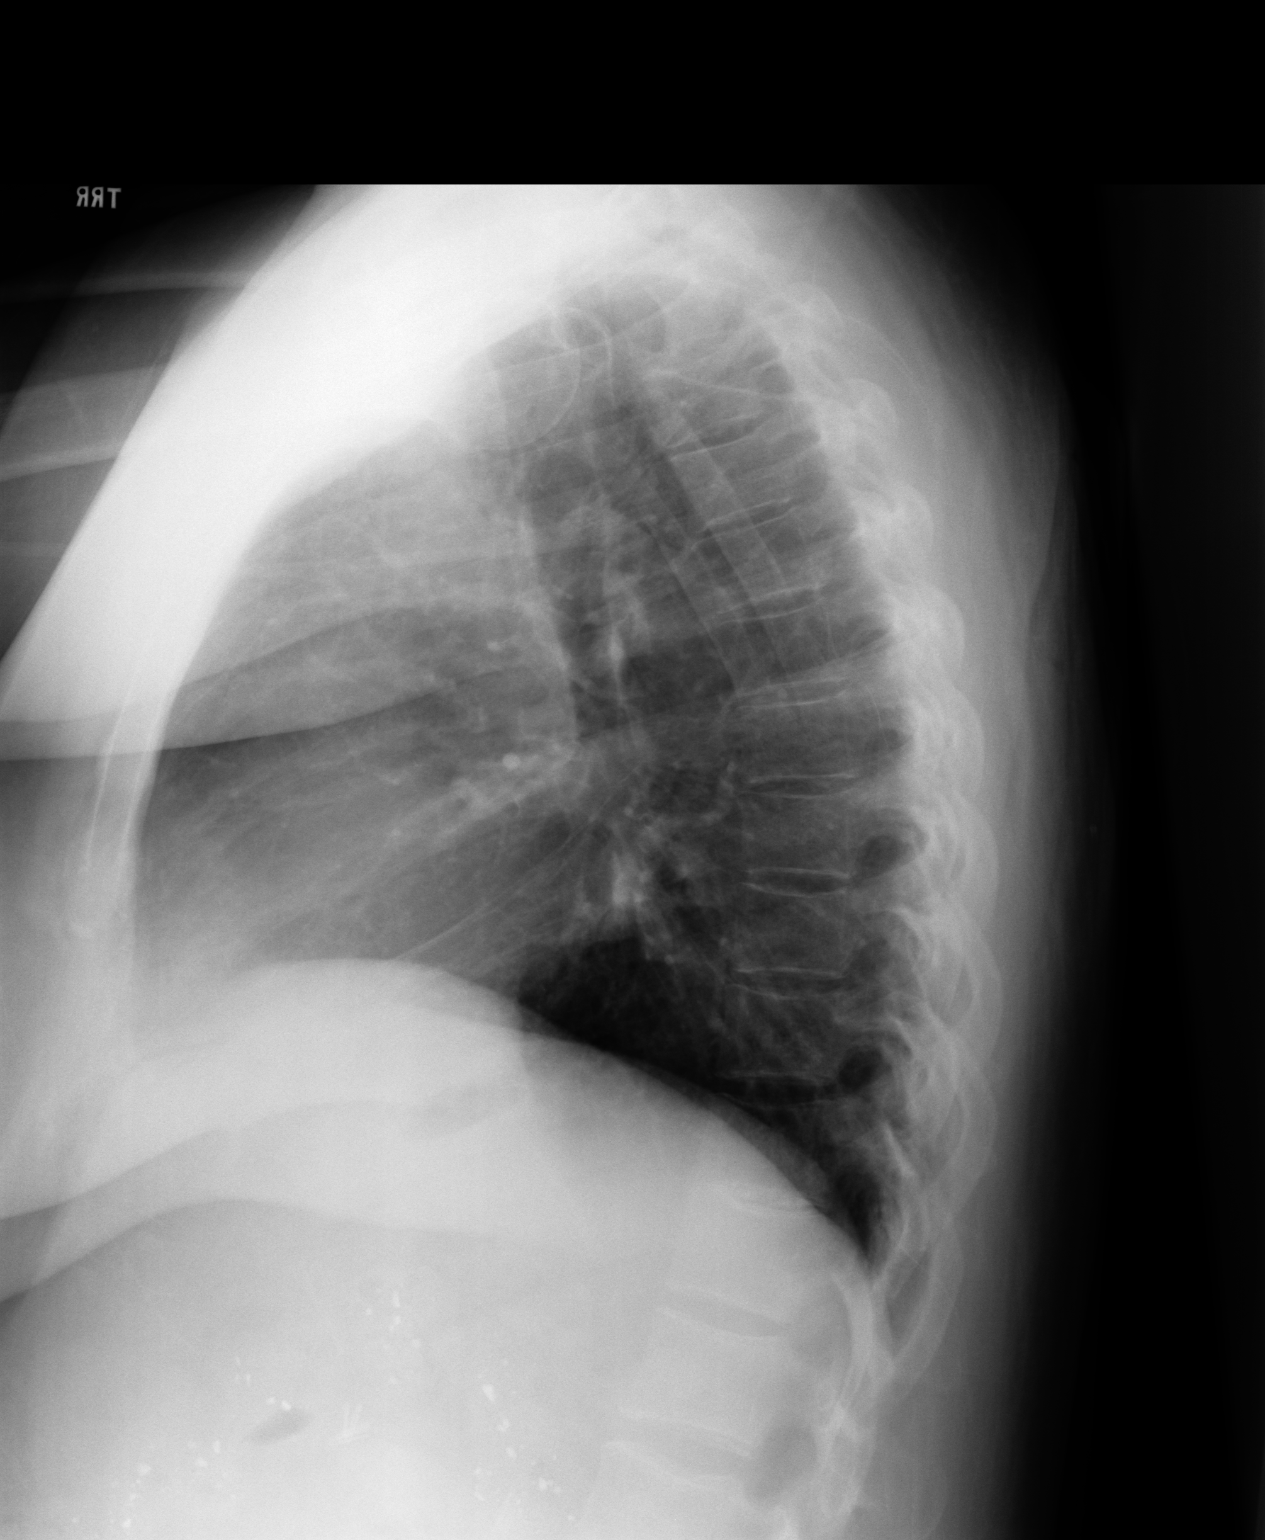

[2 of 2 positions shown; findings below may reference images not displayed]

IMPRESSION: No significant abnormalities are noted.

## 2011-07-26 IMAGING — US ABDOMEN ULTRASOUND
1 series · 17 of 25 positions shown · non-contrast
Comparison: none

REASON FOR EXAM: pain
COMMENTS:

[Series 1: abdomen ultrasound · 17 of 61 slices shown]
[im 1/61]
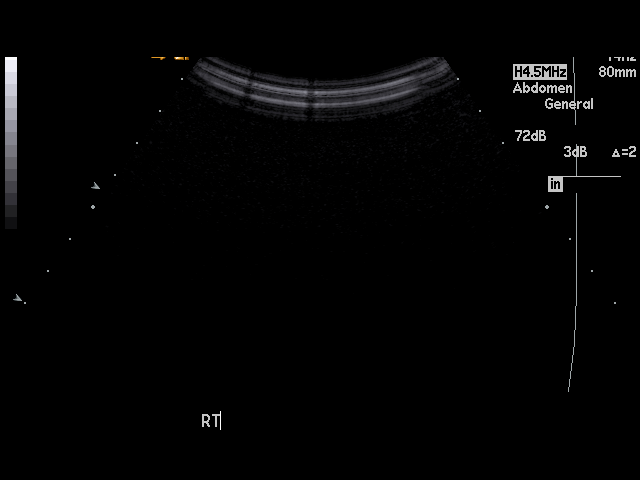
[im 6/61]
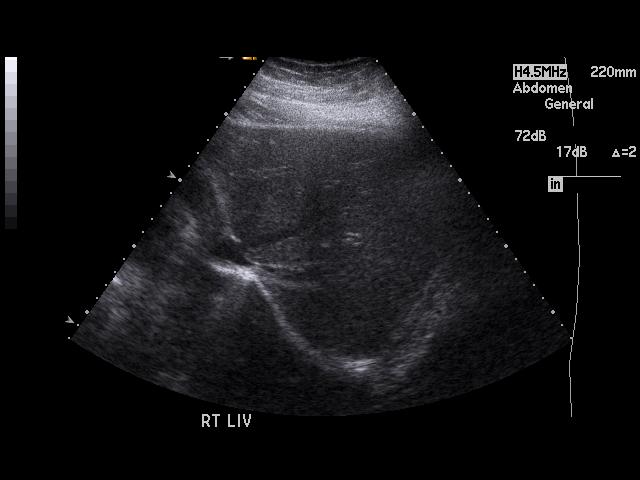
[im 8/61]
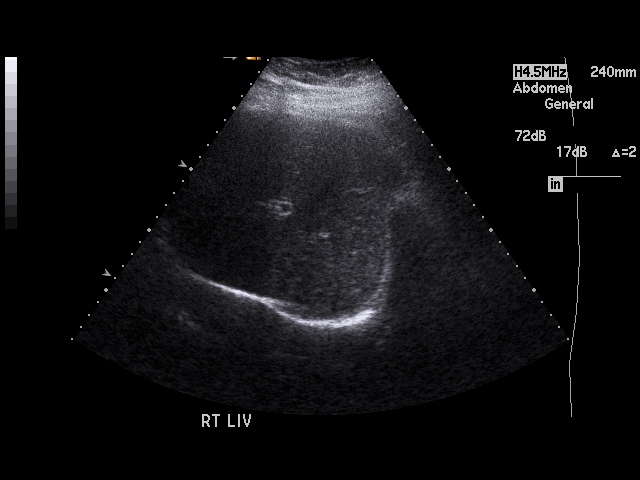
[im 13/61]
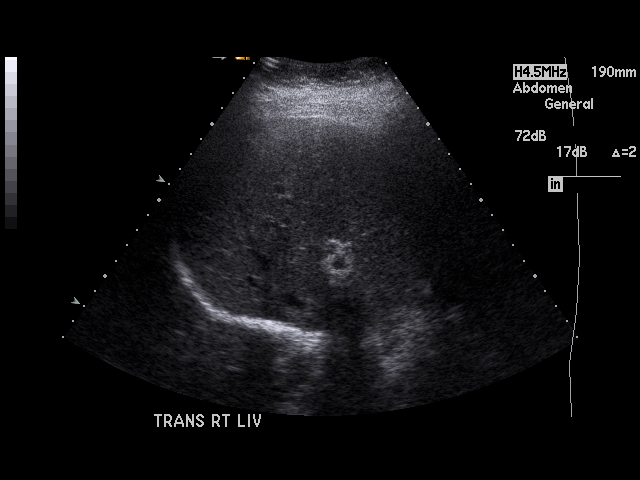
[im 16/61]
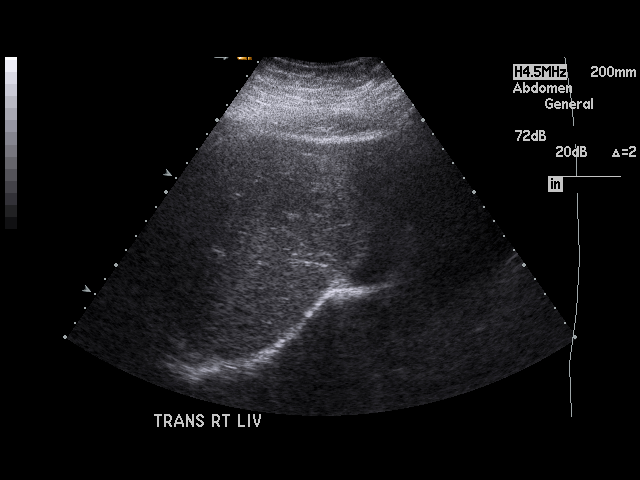
[im 21/61]
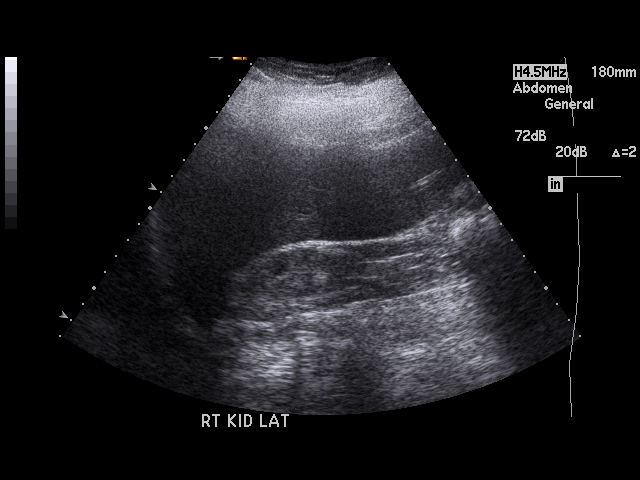
[im 23/61]
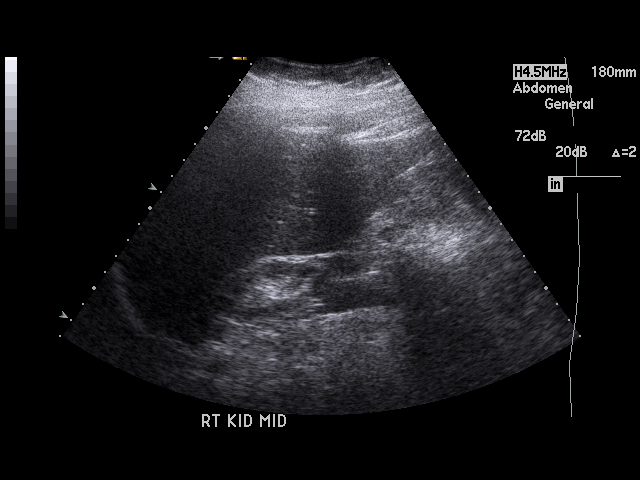
[im 28/61]
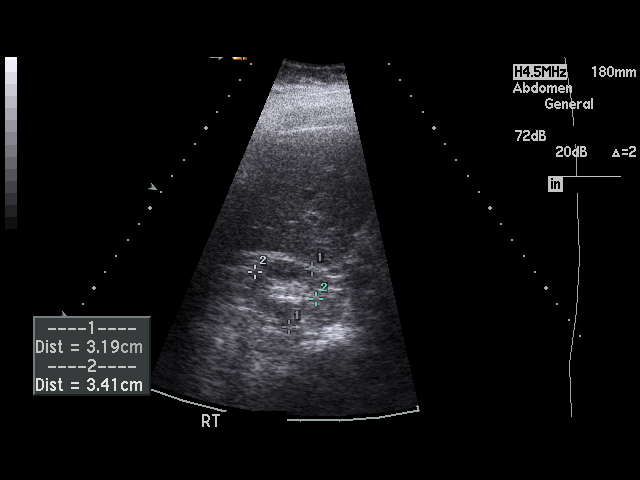
[im 31/61]
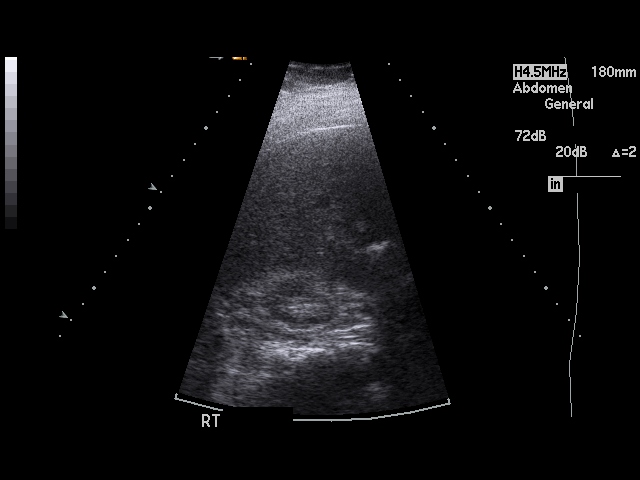
[im 33/61]
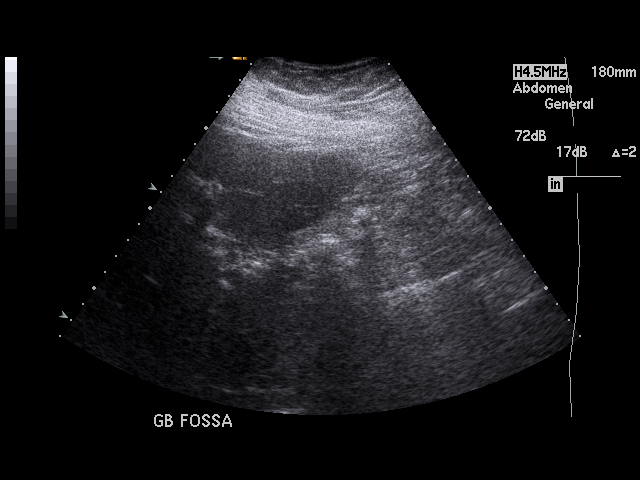
[im 38/61]
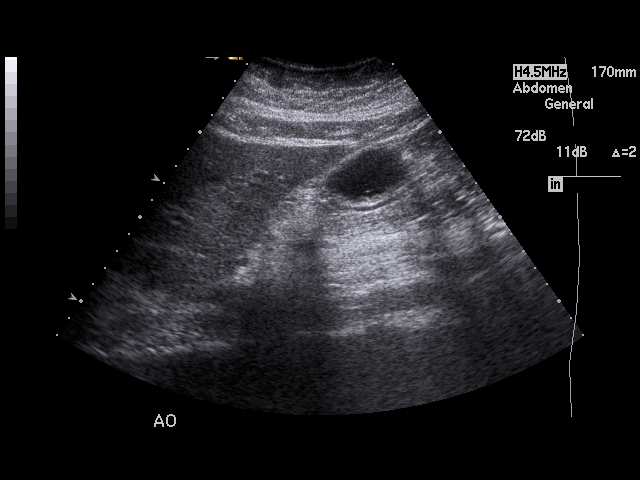
[im 41/61]
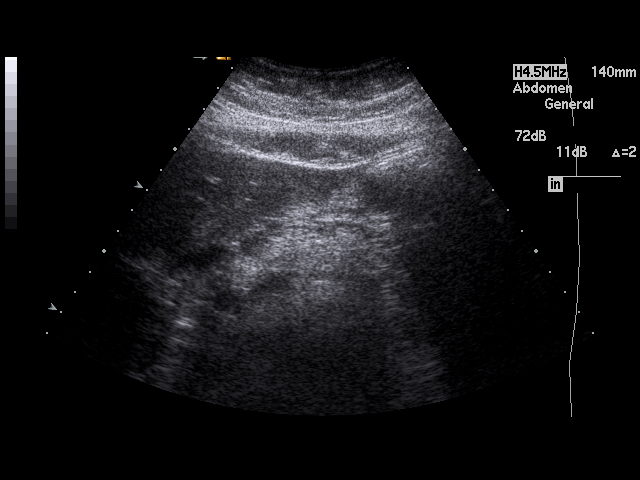
[im 46/61]
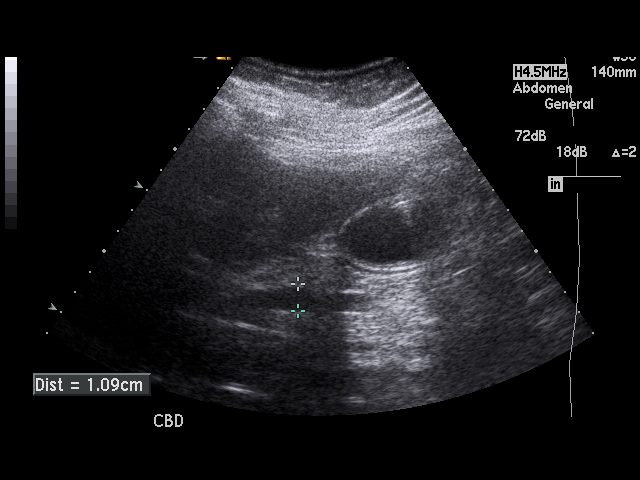
[im 48/61]
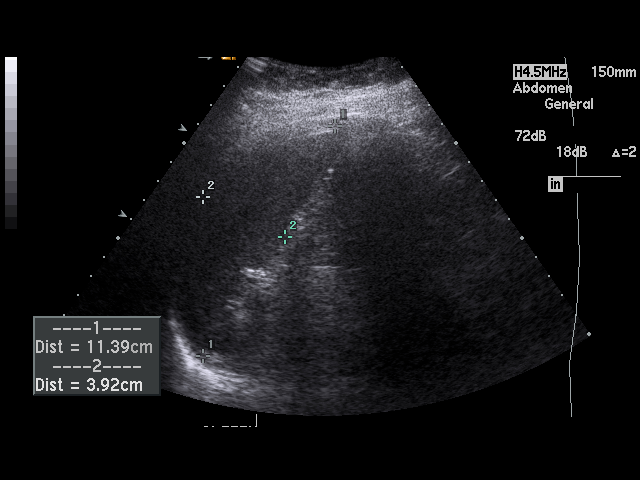
[im 53/61]
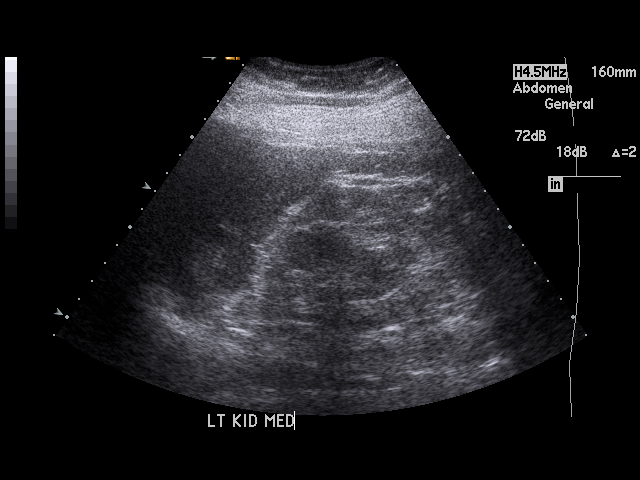
[im 56/61]
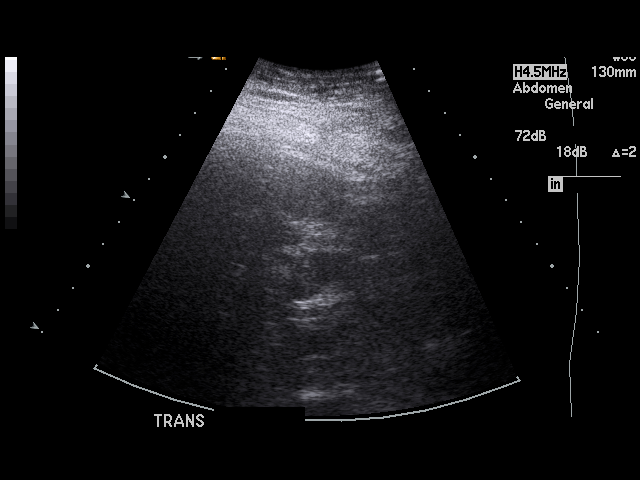
[im 61/61]
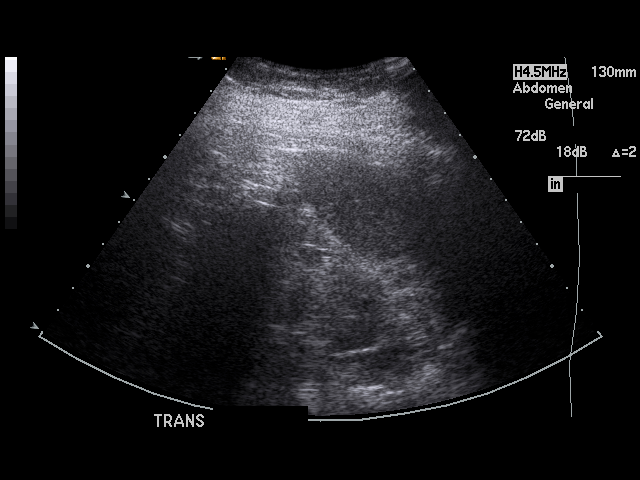

[17 of 25 positions shown; findings below may reference images not displayed]

PROCEDURE:     US  - US ABDOMEN GENERAL SURVEY  - March 20, 2011  [DATE]

RESULT:     Abdominal sonogram the is performed and compared to the previous
study of 01/02/2003. Current study shows kidneys are small the right kidney
length is 7.67 cm and the left kidney length is 6.41 cm. Neither kidney
demonstrates obstruction. The inferior vena cava, abdominal aorta and
hepatic echotexture appear normal. The spleen is unremarkable in size and
echotexture. This may increased slightly heterogeneous echogenicity in the
pancreas which may represent fatty infiltration. A discrete defined mass is
not evident. There is a history of cholecystectomy. The common bile duct
diameter is 9.5 mm which can be within normal limits. No ascites is evident.
The abdominal aorta is normal in caliber. The inferior vena cava is patent.
IMPRESSION: 1. Status post cholecystectomy. Chronic renal disease changes evident. The
patient is on dialysis. Presumed fatty infiltration of the pancreas.

## 2011-07-26 IMAGING — CR DG ABDOMEN 1V
1 series · 1 of 1 positions shown · non-contrast
Comparison: none

REASON FOR EXAM: abd pain, nausea vomiting
COMMENTS:   Bedside (portable):Y

[view not recorded]
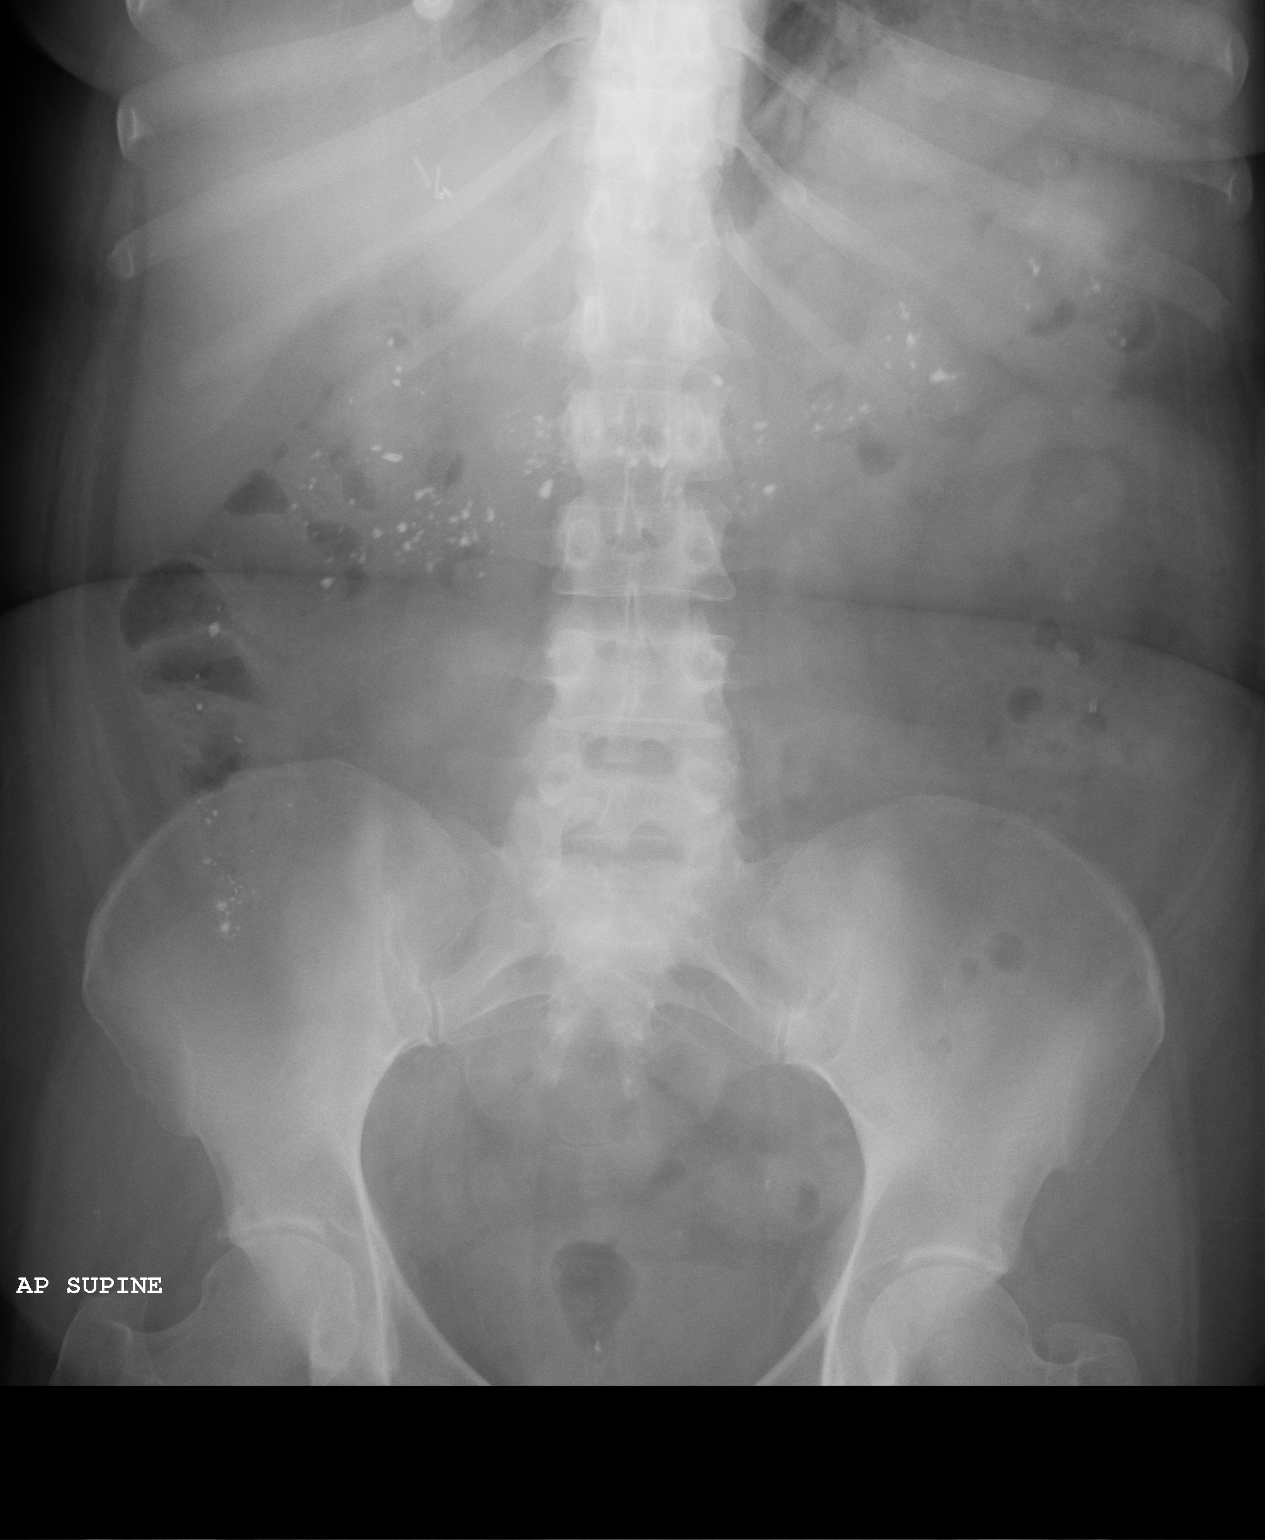

[1 of 1 positions shown; findings below may reference images not displayed]

PROCEDURE:     DXR - DXR KIDNEY URETER BLADDER  - March 20, 2011  [DATE]

RESULT:     Comparison is made to the prior exam of 03/10/2011. The bowel gas
pattern is normal. There is noted opaque material in the colon, likely
representing opaque material from medications such as antacids. There are no
dilated bowel loops identified. Postoperative metallic clips are noted in
the region of the gallbladder bed. The osseous structures are normal in
appearance. No abnormal intraabdominal calcifications are seen.
IMPRESSION: No significant abnormalities are noted.

## 2011-07-26 IMAGING — CR DG CHEST 1V PORT
1 series · 1 of 1 positions shown · non-contrast
Comparison: none

REASON FOR EXAM: chest pain
COMMENTS:

PROCEDURE:     DXR - DXR PORTABLE CHEST SINGLE VIEW  - March 20, 2011  [DATE]
RESULT:     Comparison is made to the prior exam 03/19/2011. The lung fields
are clear. The heart, mediastinal and osseous structures show no significant
abnormalities.

[view not recorded]
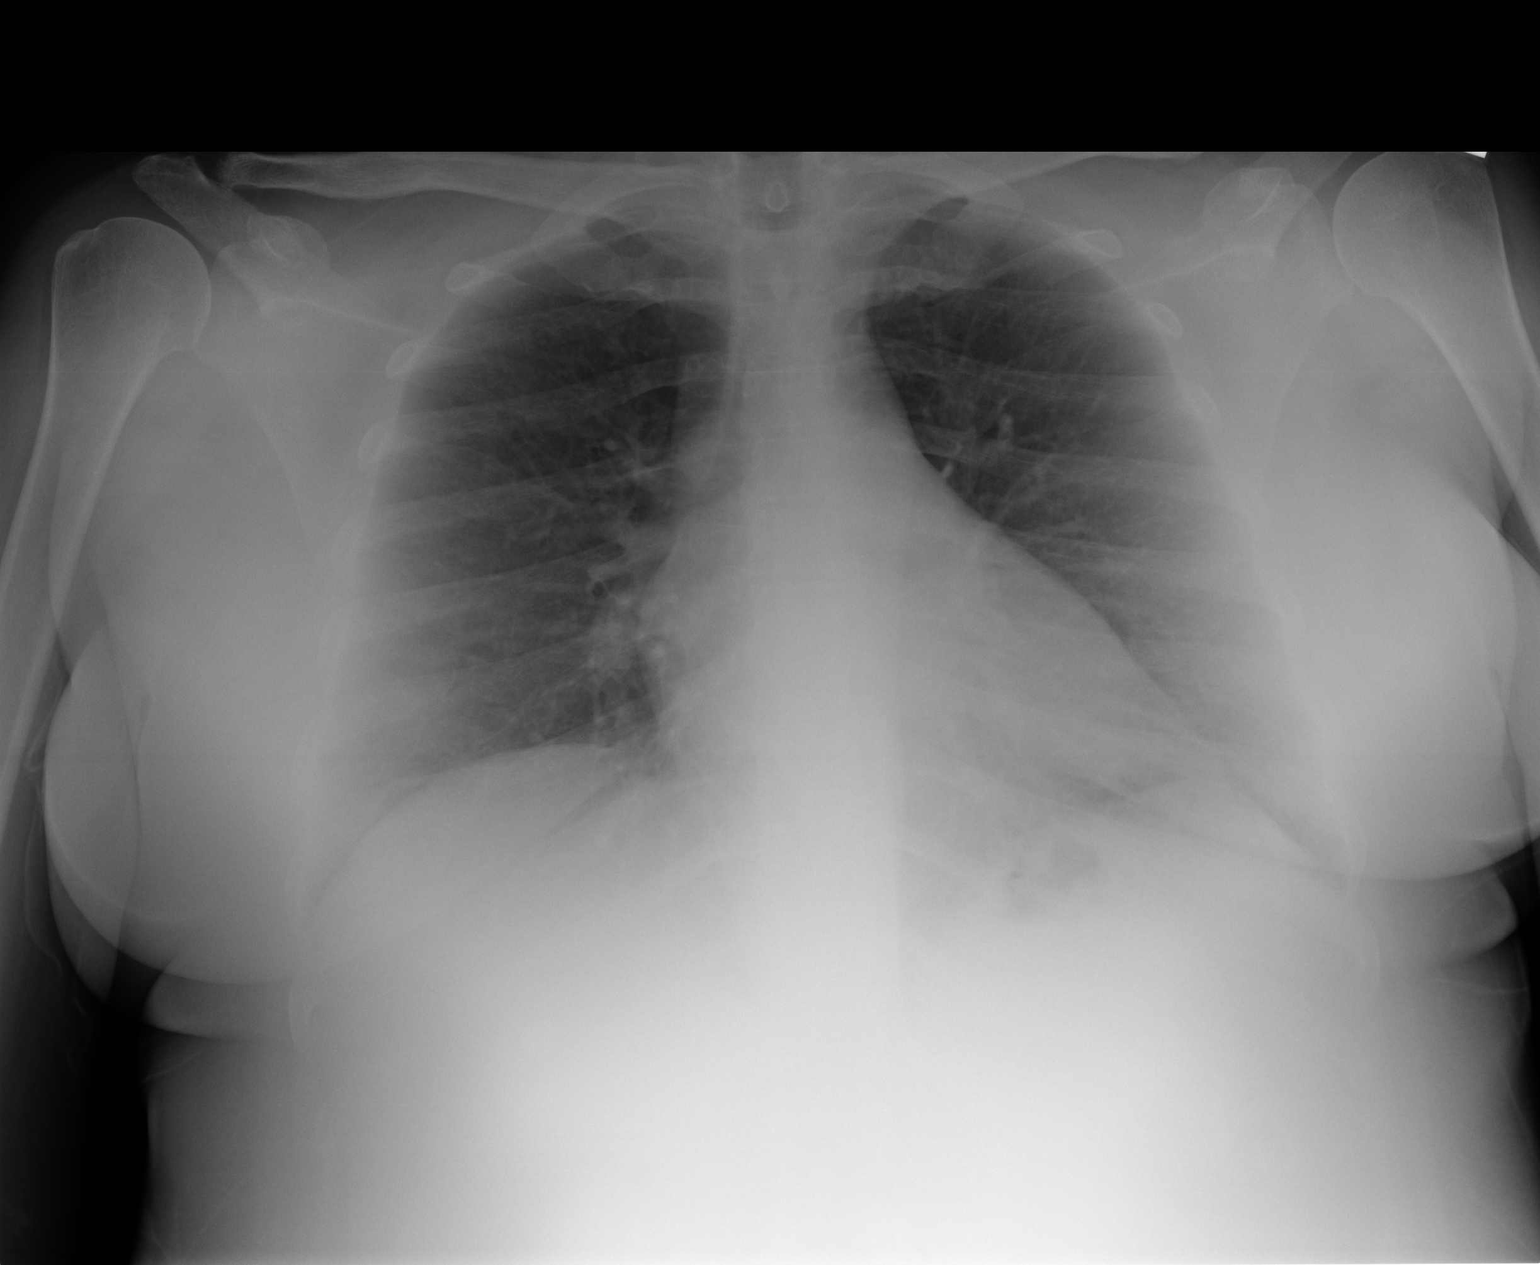

[1 of 1 positions shown; findings below may reference images not displayed]

IMPRESSION: No significant abnormalities are noted.

## 2011-10-12 ENCOUNTER — Emergency Department: Payer: Self-pay | Admitting: Unknown Physician Specialty

## 2011-10-12 LAB — CBC
HCT: 31.6 % — ABNORMAL LOW (ref 35.0–47.0)
HGB: 10.3 g/dL — ABNORMAL LOW (ref 12.0–16.0)
MCH: 31 pg (ref 26.0–34.0)
MCHC: 32.8 g/dL (ref 32.0–36.0)
MCV: 95 fL (ref 80–100)
Platelet: 171 10*3/uL (ref 150–440)
RBC: 3.33 10*6/uL — ABNORMAL LOW (ref 3.80–5.20)
RDW: 14 % (ref 11.5–14.5)
WBC: 5.6 10*3/uL (ref 3.6–11.0)

## 2011-10-12 LAB — TROPONIN I: Troponin-I: <0.02 ng/mL

## 2011-10-12 LAB — COMPREHENSIVE METABOLIC PANEL
Albumin: 3.6 g/dL (ref 3.4–5.0)
Alkaline Phosphatase: 77 U/L (ref 50–136)
Anion Gap: 11 (ref 7–16)
BUN: 25 mg/dL — ABNORMAL HIGH (ref 7–18)
Bilirubin,Total: 0.3 mg/dL (ref 0.2–1.0)
Calcium, Total: 9 mg/dL (ref 8.5–10.1)
Chloride: 99 mmol/L (ref 98–107)
Co2: 27 mmol/L (ref 21–32)
Creatinine: 6.55 mg/dL — ABNORMAL HIGH (ref 0.60–1.30)
EGFR (African American): 9 — ABNORMAL LOW
EGFR (Non-African Amer.): 7 — ABNORMAL LOW
Glucose: 217 mg/dL — ABNORMAL HIGH (ref 65–99)
Osmolality: 285 (ref 275–301)
Potassium: 4.9 mmol/L (ref 3.5–5.1)
SGOT(AST): 20 U/L (ref 15–37)
SGPT (ALT): 21 U/L
Sodium: 137 mmol/L (ref 136–145)
Total Protein: 7.1 g/dL (ref 6.4–8.2)

## 2011-10-12 LAB — MAGNESIUM: Magnesium: 2.2 mg/dL

## 2011-10-28 ENCOUNTER — Ambulatory Visit: Payer: Self-pay | Admitting: Cardiovascular Disease

## 2011-10-28 LAB — POTASSIUM: Potassium: 3.8 mmol/L (ref 3.5–5.1)

## 2011-10-29 LAB — BASIC METABOLIC PANEL
Anion Gap: 11 (ref 7–16)
BUN: 24 mg/dL — ABNORMAL HIGH (ref 7–18)
Calcium, Total: 8.5 mg/dL (ref 8.5–10.1)
Chloride: 94 mmol/L — ABNORMAL LOW (ref 98–107)
Co2: 31 mmol/L (ref 21–32)
Creatinine: 6.07 mg/dL — ABNORMAL HIGH (ref 0.60–1.30)
EGFR (African American): 9 — ABNORMAL LOW
EGFR (Non-African Amer.): 8 — ABNORMAL LOW
Glucose: 96 mg/dL (ref 65–99)
Osmolality: 276 (ref 275–301)
Potassium: 4.2 mmol/L (ref 3.5–5.1)
Sodium: 136 mmol/L (ref 136–145)

## 2011-11-12 ENCOUNTER — Ambulatory Visit: Payer: Self-pay | Admitting: Ophthalmology

## 2011-11-12 LAB — POTASSIUM: Potassium: 5.4 mmol/L — ABNORMAL HIGH (ref 3.5–5.1)

## 2011-11-20 IMAGING — XA IR VASCULAR PROCEDURE
14 of 18 series · 15 of 24 positions shown · IV contrast (IODINE)
Comparison: none

[Series 1: care upper arm · 1 of 2 slices shown (1 of 6)]
[im 1/2]
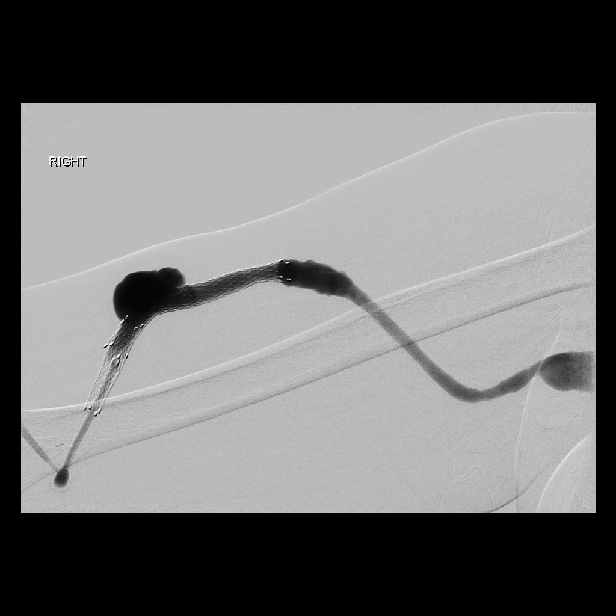

[Series 3: care upper arm · 1 of 2 slices shown (2 of 6)]
[im 1/2]
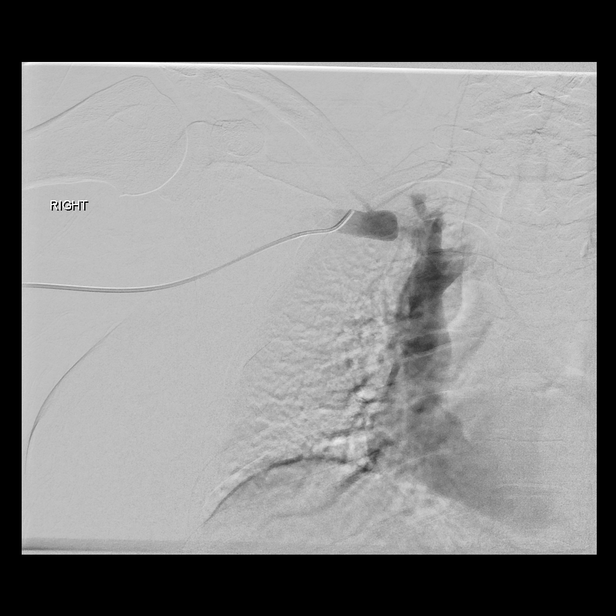

[Series 4: fl - angio · 1 of 1 slices shown (1 of 8)]
[im 1/1]
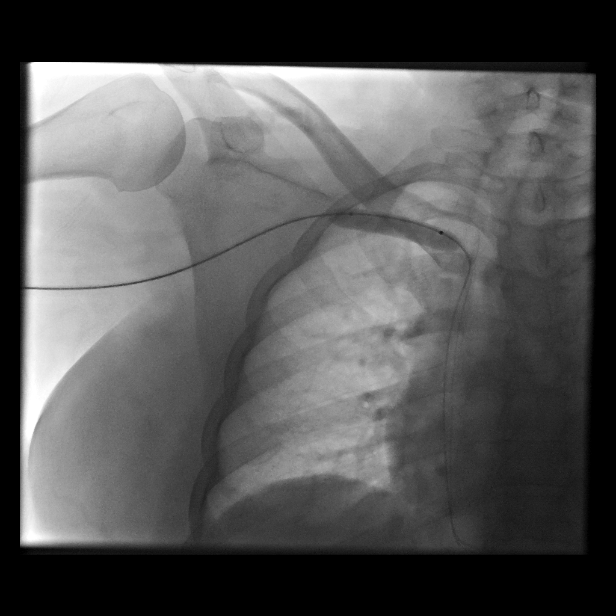

[Series 5: fl - angio · 1 of 1 slices shown (2 of 8)]
[im 1/1]
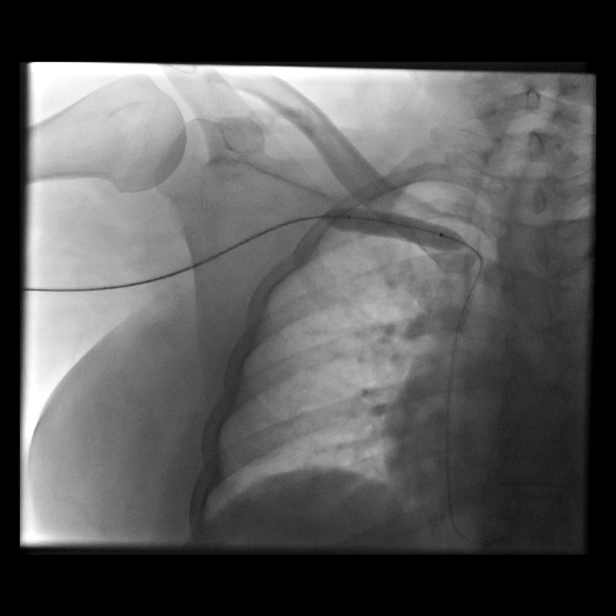

[Series 6: fl - angio · 1 of 2 slices shown (3 of 8)]
[im 2/2]
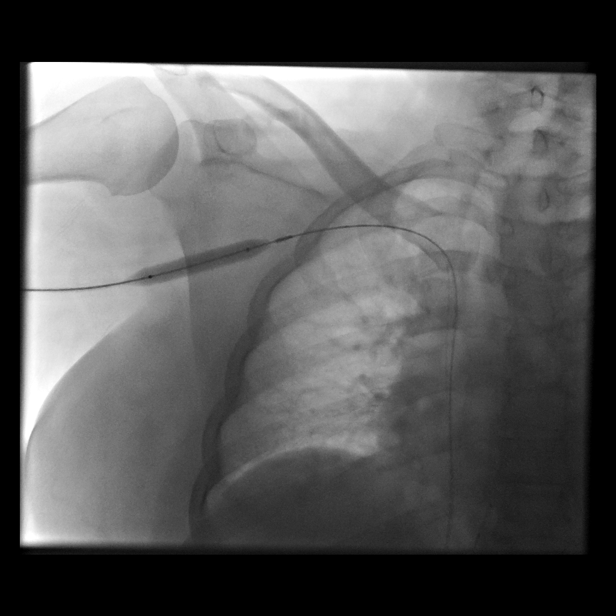

[Series 7: fl - angio · 1 of 1 slices shown (4 of 8)]
[im 1/1]
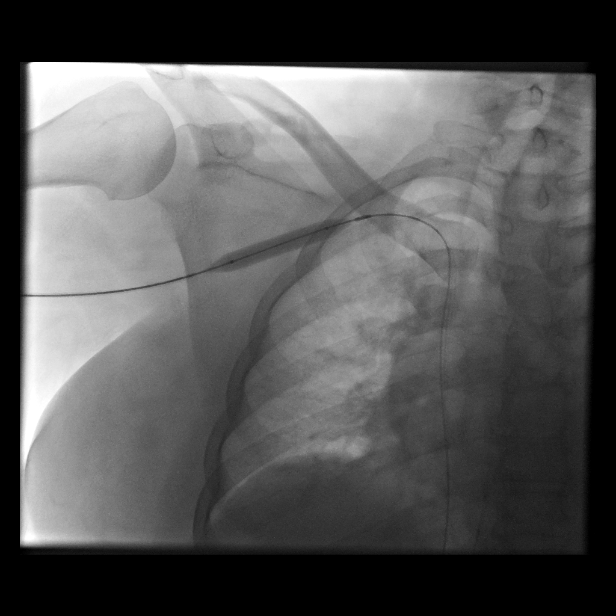

[Series 9: care upper arm · 1 of 2 slices shown (3 of 6)]
[im 1/2]
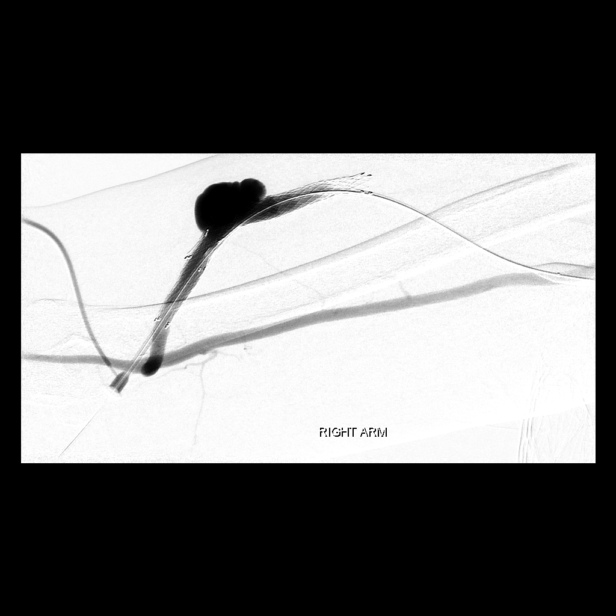

[Series 10: care upper arm · 2 of 2 slices shown (4 of 6)]
[im 1/2]
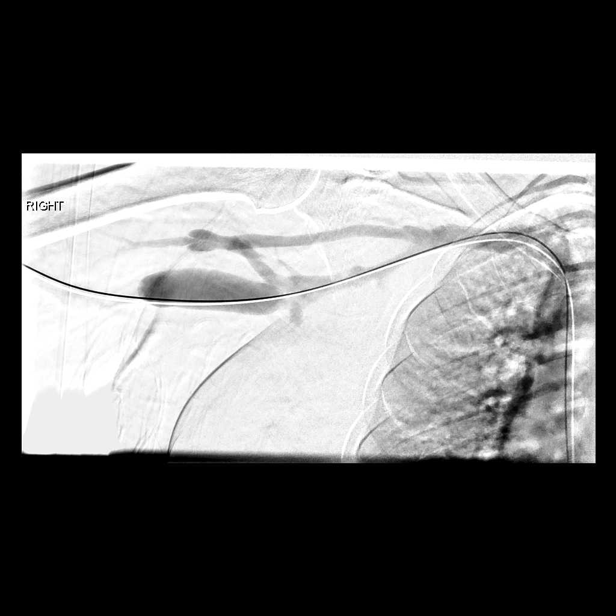
[im 2/2]
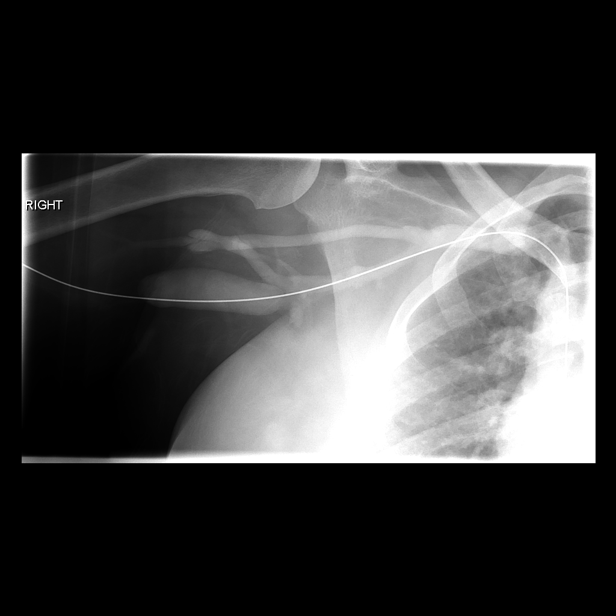

[Series 12: fl - angio · 1 of 1 slices shown (5 of 8)]
[im 1/1]
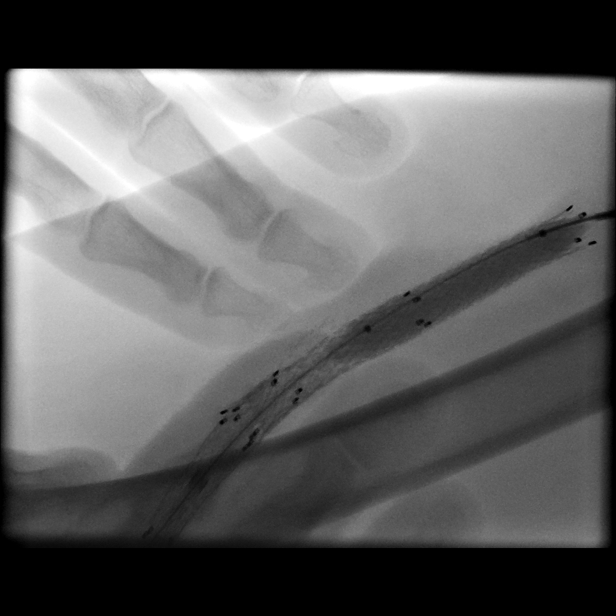

[Series 13: fl - angio · 1 of 1 slices shown (6 of 8)]
[im 1/1]
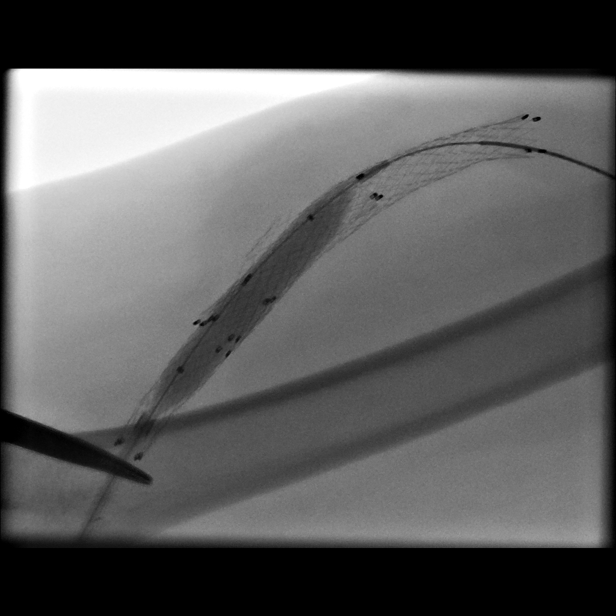

[Series 14: care upper arm · 1 of 2 slices shown (5 of 6)]
[im 2/2]
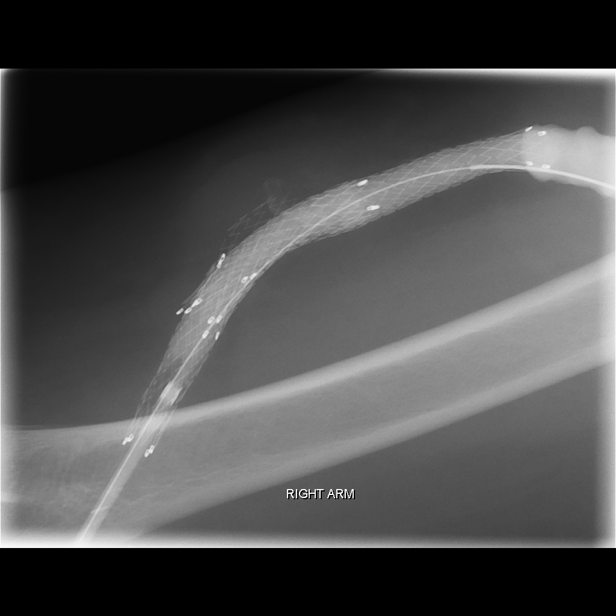

[Series 16: fl - angio · 1 of 1 slices shown (7 of 8)]
[im 1/1]
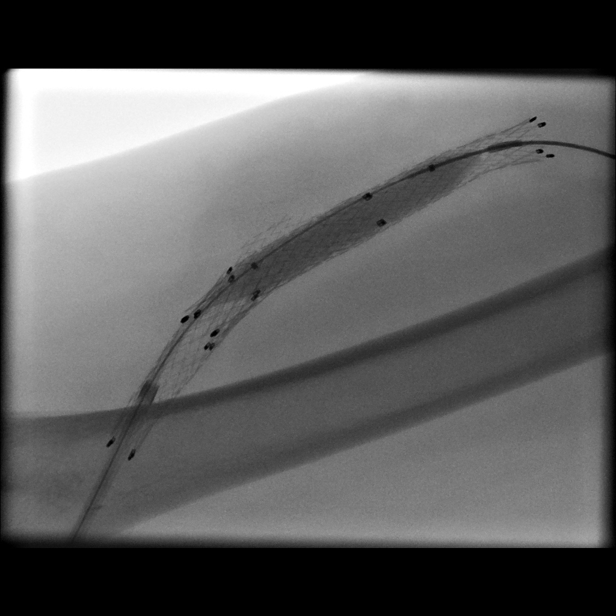

[Series 17: fl - angio · 1 of 1 slices shown (8 of 8)]
[im 1/1]
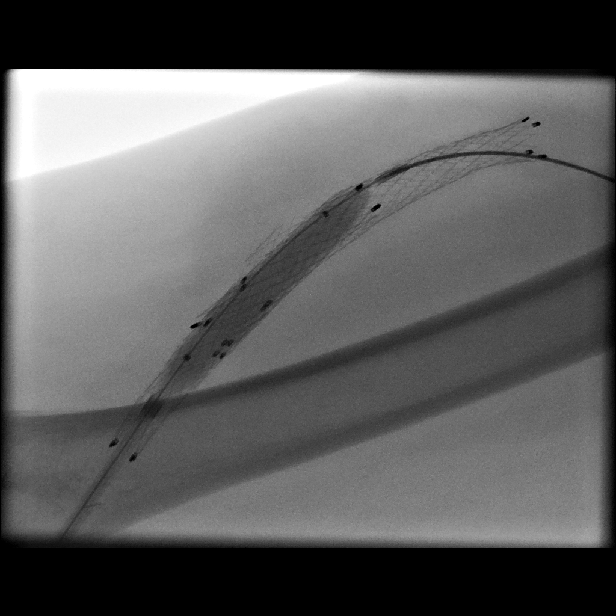

[Series 18: care upper arm · 1 of 2 slices shown (6 of 6)]
[im 2/2]
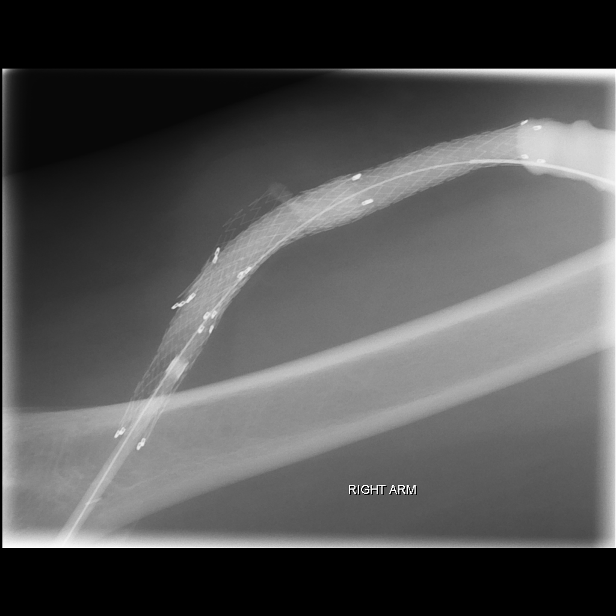

[15 of 24 positions shown; findings below may reference images not displayed]

IMAGES IMPORTED FROM THE [employer] WORKFLOW SYSTEM
NO DICTATION FOR STUDY

## 2011-12-10 ENCOUNTER — Ambulatory Visit: Payer: Self-pay | Admitting: Ophthalmology

## 2011-12-10 LAB — POTASSIUM: Potassium: 3.8 mmol/L (ref 3.5–5.1)

## 2011-12-23 ENCOUNTER — Ambulatory Visit: Payer: Self-pay | Admitting: Vascular Surgery

## 2011-12-23 LAB — POTASSIUM: Potassium: 4.5 mmol/L (ref 3.5–5.1)

## 2012-01-06 ENCOUNTER — Emergency Department: Payer: Self-pay | Admitting: Emergency Medicine

## 2012-01-07 ENCOUNTER — Ambulatory Visit: Payer: Self-pay | Admitting: Vascular Surgery

## 2012-02-17 IMAGING — CT CT HEAD WITHOUT CONTRAST
2 series · 16 of 30 positions shown, 20 images · non-contrast
Comparison: none

REASON FOR EXAM: ha blurred vision
COMMENTS:

PROCEDURE:     CT  - CT HEAD WITHOUT CONTRAST  - October 12, 2011 [DATE]
RESULT:     Technique: Helical 5mm sections were obtained from the skull
base to the vertex without administration of intravenous contrast.

[Series 2: without · axial · non-contrast · 0.43mm/px · z∈[+479,+599]mm · 13 of 28 slices shown, 17 images]
[im 2/28  brain]
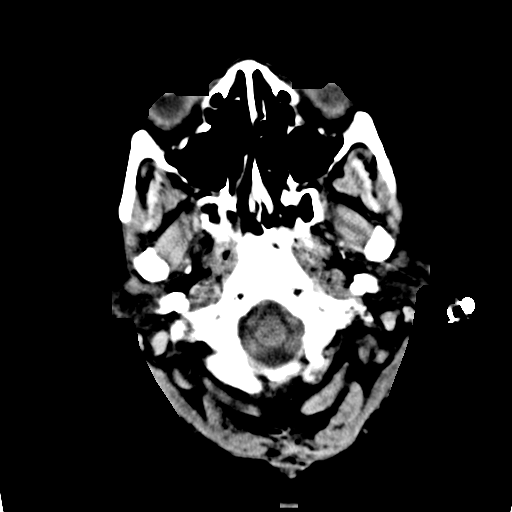
[im 2/28  bone]
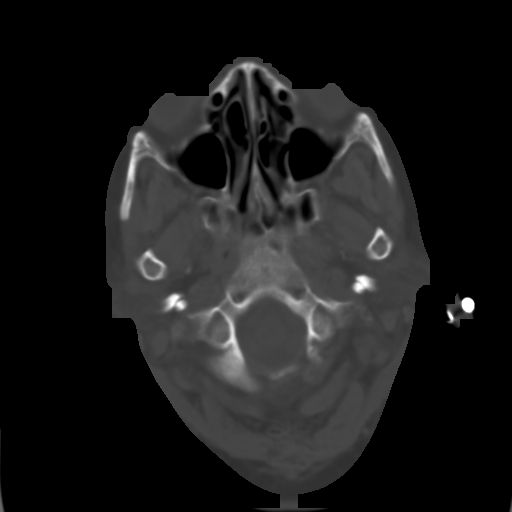
[im 4/28  brain]
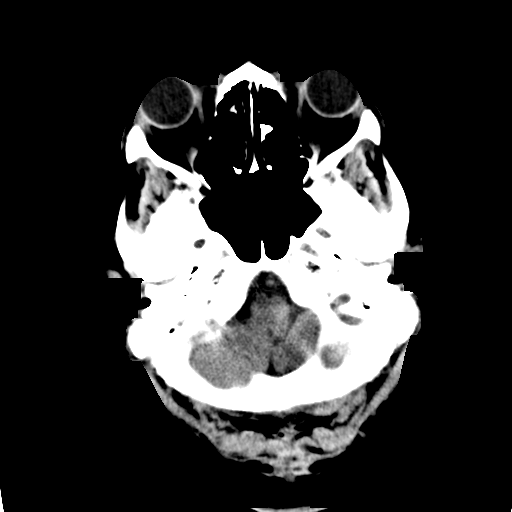
[im 6/28  brain]
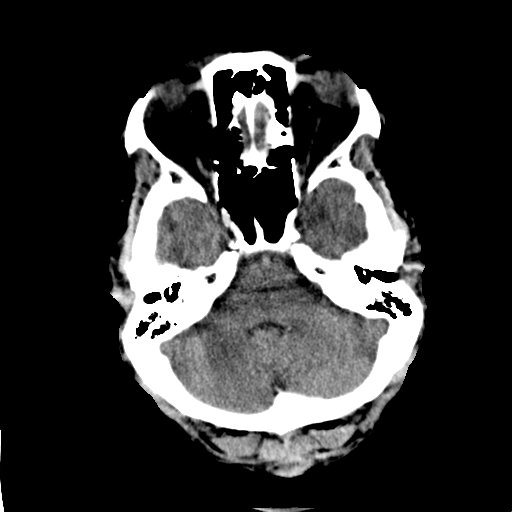
[im 8/28  brain]
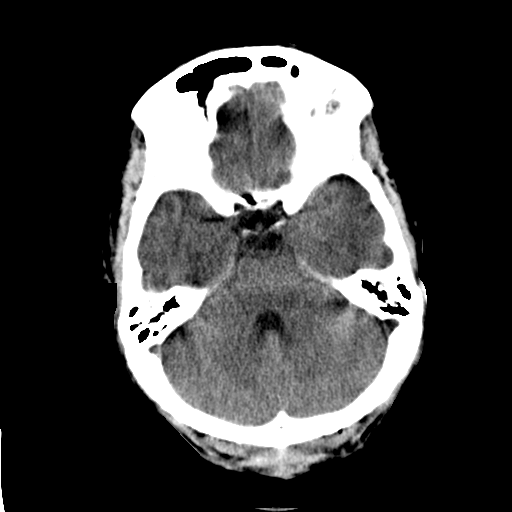
[im 10/28  brain]
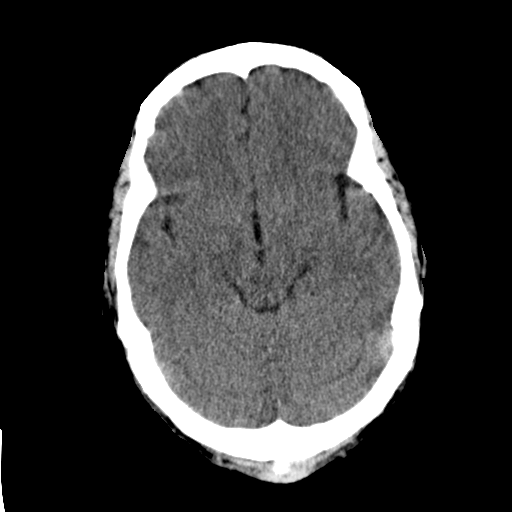
[im 10/28  bone]
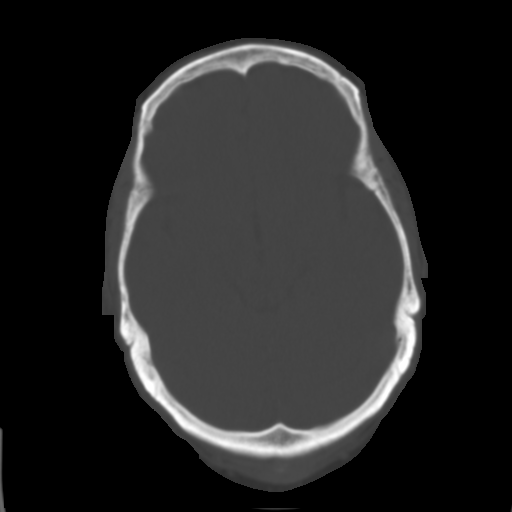
[im 12/28  brain]
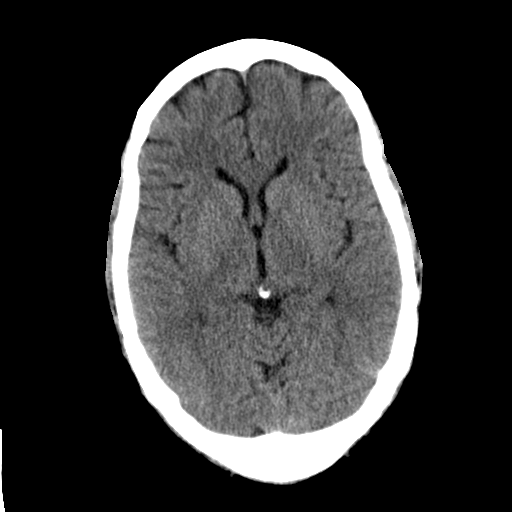
[im 14/28  brain]
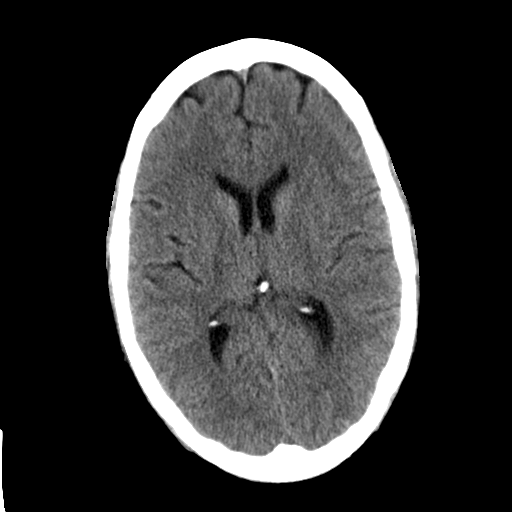
[im 16/28  brain]
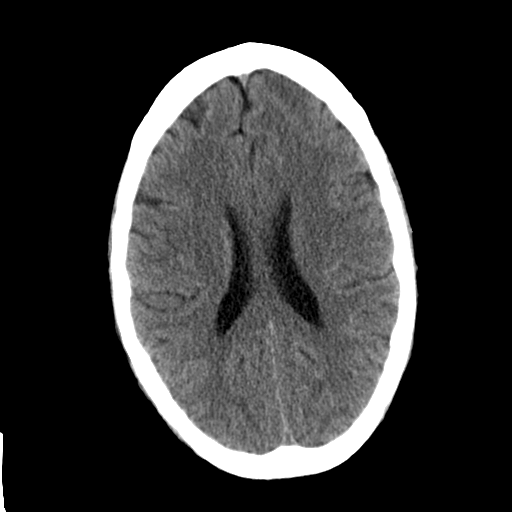
[im 18/28  brain]
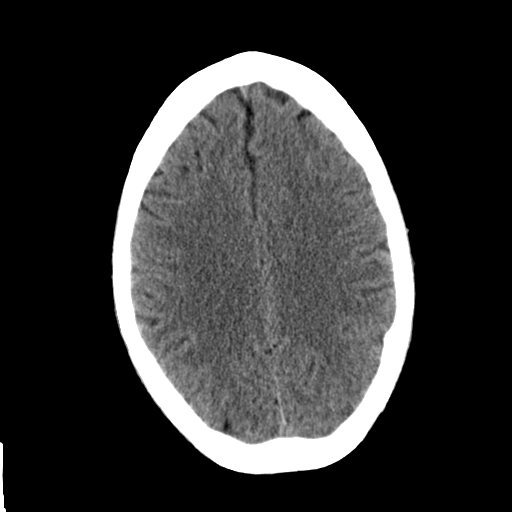
[im 18/28  bone]
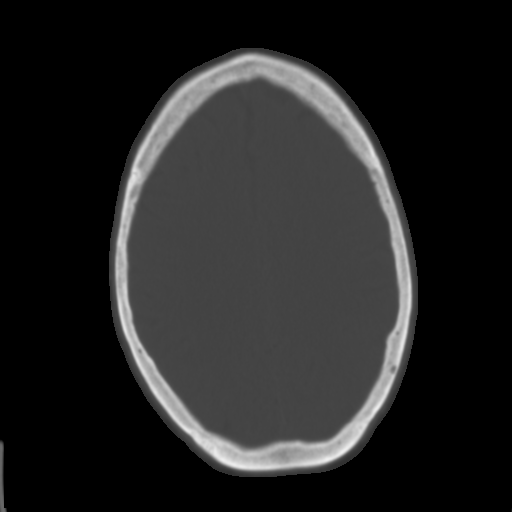
[im 20/28  brain]
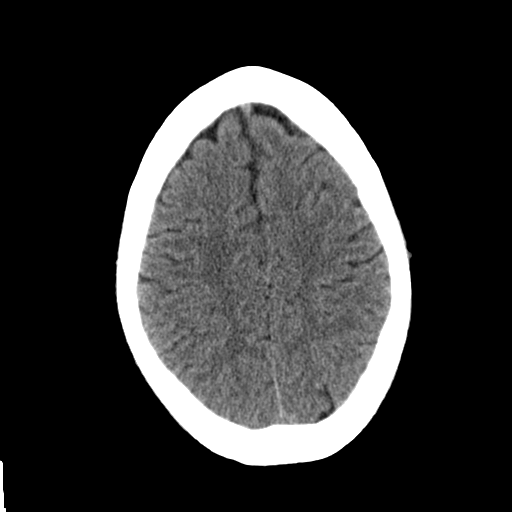
[im 22/28  brain]
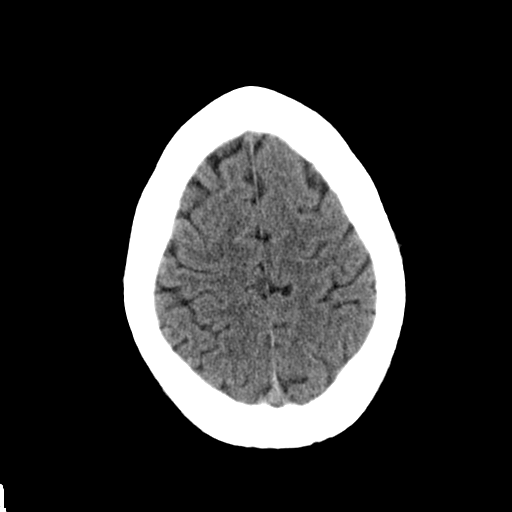
[im 24/28  brain]
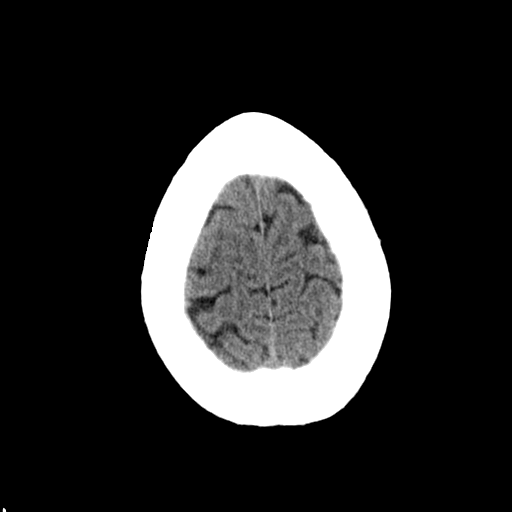
[im 26/28  brain]
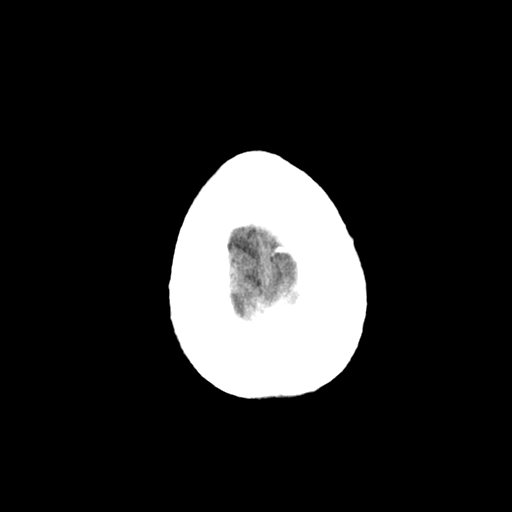
[im 26/28  bone]
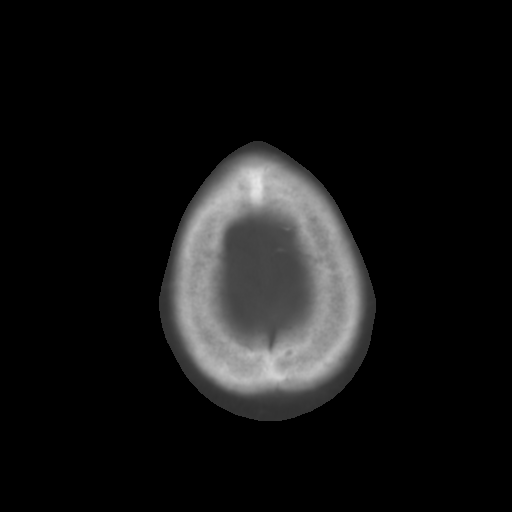

[Series 3: bone · axial · 0.43mm/px · z∈[+479,+519]mm · 3 of 28 slices shown]
[im 2/28  bone]
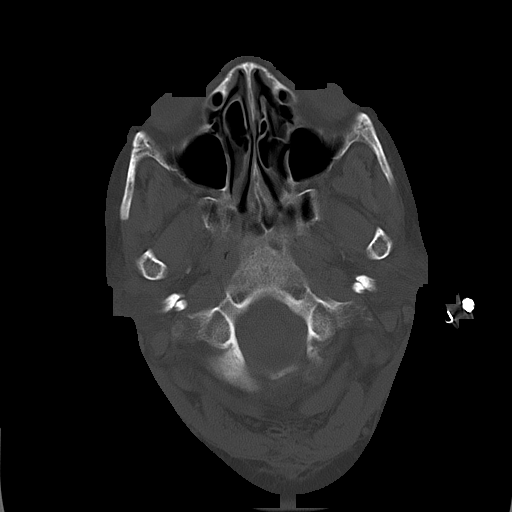
[im 6/28  bone]
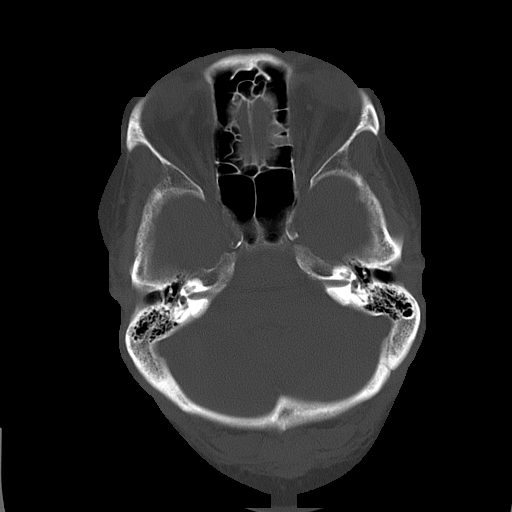
[im 10/28  bone]
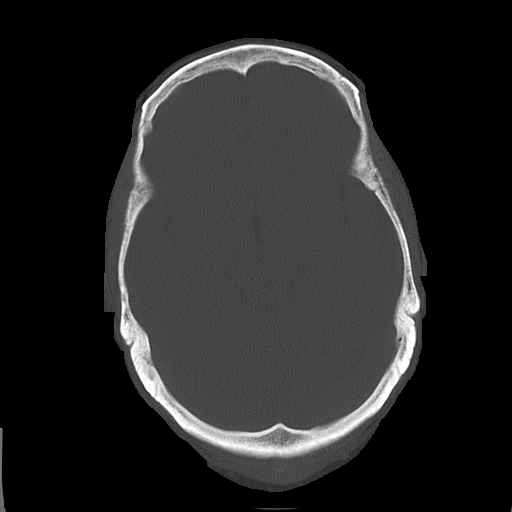

[16 of 30 positions shown; findings below may reference images not displayed]

FINDINGS: There is not evidence of intra-axial fluid collections. There is
no evidence of acute hemorrhage or secondary signs reflecting mass effect or
subacute or chronic focal territorial infarction. The osseous structures
demonstrate no evidence of a depressed skull fracture. If there is
persistent concern clinical follow-up with MRI is recommended.
IMPRESSION: 1. No evidence of acute intracranial abnormalitites.
2. A comparison was made to prior study dated 11/19/2009.

## 2012-02-17 IMAGING — CR DG CHEST 1V PORT
1 series · 1 of 1 positions shown · non-contrast
Comparison: none

REASON FOR EXAM: cp
COMMENTS:

PROCEDURE:     DXR - DXR PORTABLE CHEST SINGLE VIEW  - October 12, 2011 [DATE]
RESULT:     The lungs are clear. The cardiac silhouette and visualized bony
skeleton are unremarkable.

[ap]
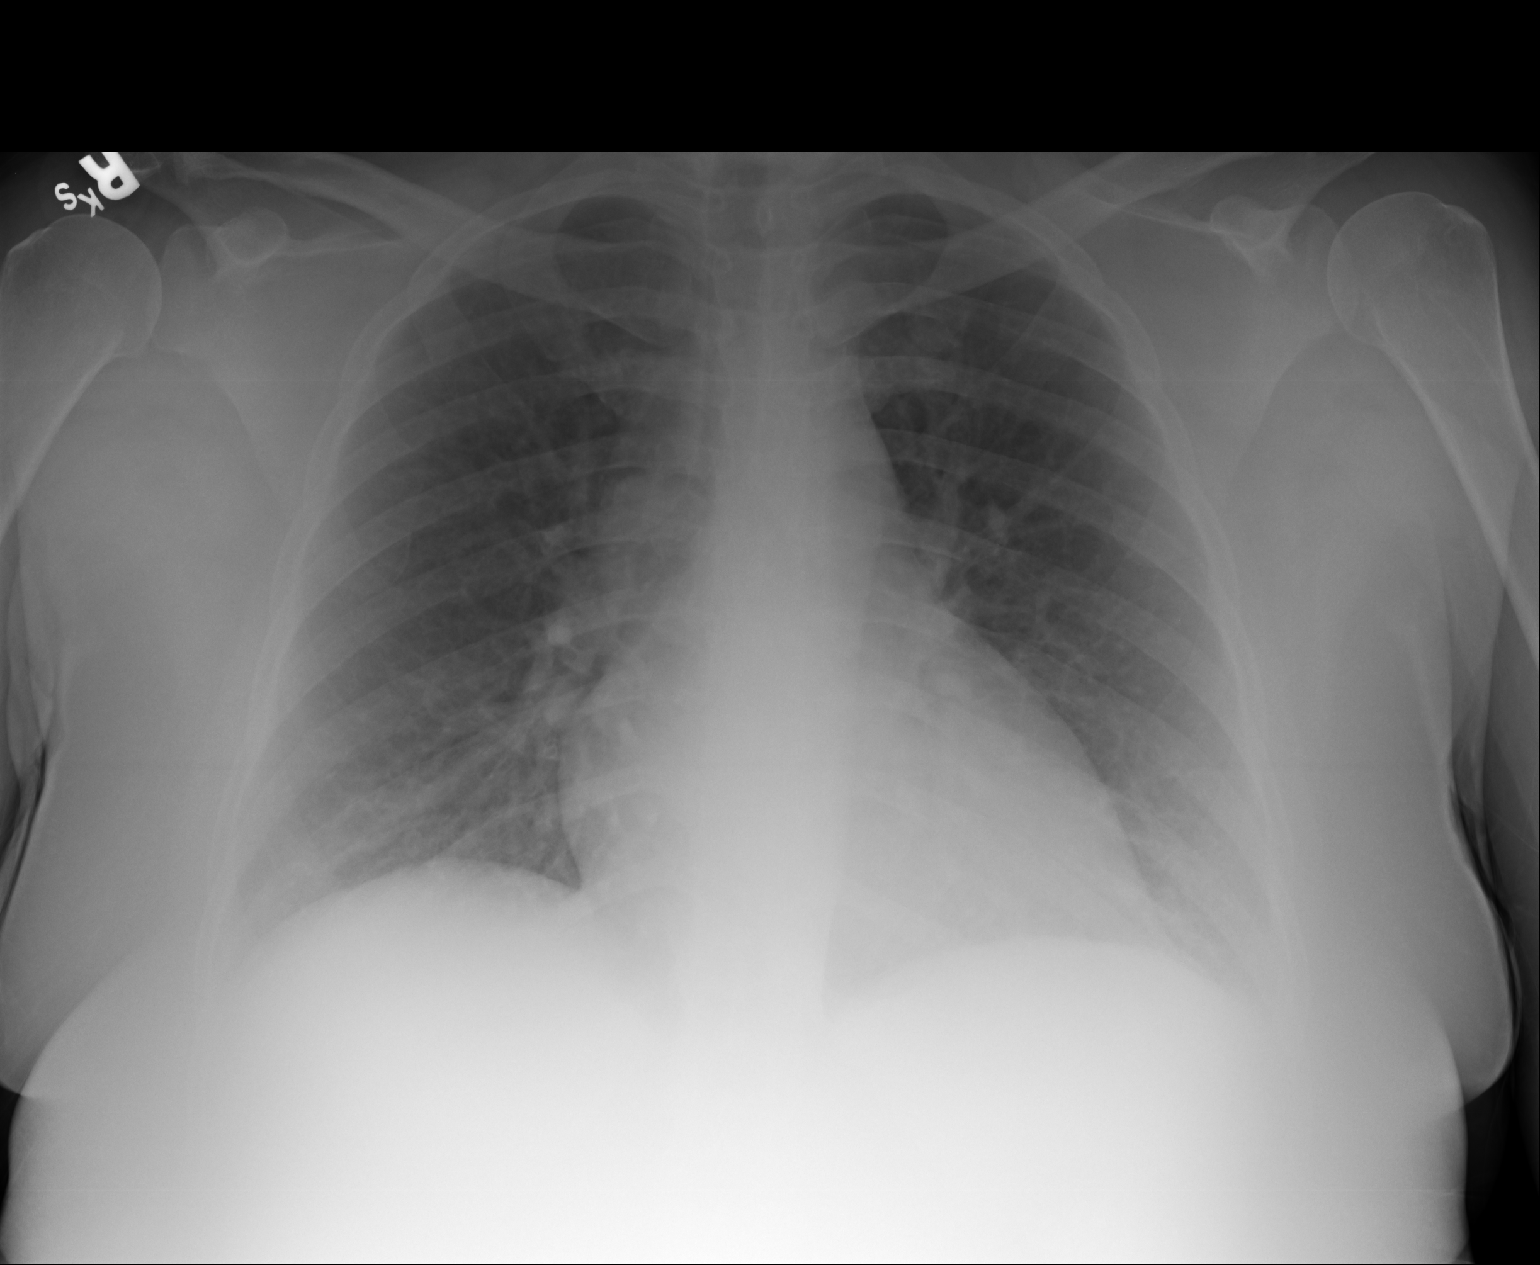

[1 of 1 positions shown; findings below may reference images not displayed]

IMPRESSION: 1. Chest radiograph without evidence of acute cardiopulmonary disease.
2. A comparison was made to prior study dated 03/20/2011.

## 2012-02-25 ENCOUNTER — Inpatient Hospital Stay: Payer: Self-pay | Admitting: Internal Medicine

## 2012-02-25 LAB — CBC
HCT: 32.6 % — ABNORMAL LOW (ref 35.0–47.0)
HGB: 10.2 g/dL — ABNORMAL LOW (ref 12.0–16.0)
MCH: 27.4 pg (ref 26.0–34.0)
MCHC: 31.4 g/dL — ABNORMAL LOW (ref 32.0–36.0)
MCV: 87 fL (ref 80–100)
Platelet: 124 10*3/uL — ABNORMAL LOW (ref 150–440)
RBC: 3.73 10*6/uL — ABNORMAL LOW (ref 3.80–5.20)
RDW: 16.4 % — ABNORMAL HIGH (ref 11.5–14.5)
WBC: 11.5 10*3/uL — ABNORMAL HIGH (ref 3.6–11.0)

## 2012-02-25 LAB — PROTIME-INR
INR: 0.9
Prothrombin Time: 12.2 secs (ref 11.5–14.7)

## 2012-02-25 LAB — COMPREHENSIVE METABOLIC PANEL
Albumin: 3.6 g/dL (ref 3.4–5.0)
Alkaline Phosphatase: 88 U/L (ref 50–136)
Anion Gap: 11 (ref 7–16)
BUN: 28 mg/dL — ABNORMAL HIGH (ref 7–18)
Bilirubin,Total: 0.5 mg/dL (ref 0.2–1.0)
Calcium, Total: 8.8 mg/dL (ref 8.5–10.1)
Chloride: 98 mmol/L (ref 98–107)
Co2: 28 mmol/L (ref 21–32)
Creatinine: 6.18 mg/dL — ABNORMAL HIGH (ref 0.60–1.30)
EGFR (African American): 8 — ABNORMAL LOW
EGFR (Non-African Amer.): 7 — ABNORMAL LOW
Glucose: 71 mg/dL (ref 65–99)
Osmolality: 278 (ref 275–301)
Potassium: 4.5 mmol/L (ref 3.5–5.1)
SGOT(AST): 15 U/L (ref 15–37)
SGPT (ALT): 15 U/L
Sodium: 137 mmol/L (ref 136–145)
Total Protein: 7.2 g/dL (ref 6.4–8.2)

## 2012-02-25 LAB — CK TOTAL AND CKMB (NOT AT ARMC)
CK, Total: 40 U/L (ref 21–215)
CK, Total: 37 U/L (ref 21–215)
CK-MB: 0.5 ng/mL (ref 0.5–3.6)
CK-MB: <0.5 ng/mL — ABNORMAL LOW (ref 0.5–3.6)

## 2012-02-25 LAB — TROPONIN I
Troponin-I: <0.02 ng/mL
Troponin-I: <0.02 ng/mL

## 2012-02-25 LAB — LIPASE, BLOOD: Lipase: 285 U/L (ref 73–393)

## 2012-02-25 LAB — HEMOGLOBIN: HGB: 9.4 g/dL — ABNORMAL LOW (ref 12.0–16.0)

## 2012-02-26 LAB — BASIC METABOLIC PANEL
Anion Gap: 8 (ref 7–16)
BUN: 35 mg/dL — ABNORMAL HIGH (ref 7–18)
Calcium, Total: 8.6 mg/dL (ref 8.5–10.1)
Chloride: 97 mmol/L — ABNORMAL LOW (ref 98–107)
Co2: 29 mmol/L (ref 21–32)
Creatinine: 6.97 mg/dL — ABNORMAL HIGH (ref 0.60–1.30)
EGFR (African American): 7 — ABNORMAL LOW
EGFR (Non-African Amer.): 6 — ABNORMAL LOW
Glucose: 109 mg/dL — ABNORMAL HIGH (ref 65–99)
Osmolality: 277 (ref 275–301)
Potassium: 4.7 mmol/L (ref 3.5–5.1)
Sodium: 134 mmol/L — ABNORMAL LOW (ref 136–145)

## 2012-02-26 LAB — CBC WITH DIFFERENTIAL/PLATELET
Basophil #: 0 10*3/uL (ref 0.0–0.1)
Basophil %: 0.6 %
Eosinophil #: 0.4 10*3/uL (ref 0.0–0.7)
Eosinophil %: 5.8 %
HCT: 31.1 % — ABNORMAL LOW (ref 35.0–47.0)
HGB: 9.7 g/dL — ABNORMAL LOW (ref 12.0–16.0)
Lymphocyte #: 1.8 10*3/uL (ref 1.0–3.6)
Lymphocyte %: 27.8 %
MCH: 27.4 pg (ref 26.0–34.0)
MCHC: 31.3 g/dL — ABNORMAL LOW (ref 32.0–36.0)
MCV: 88 fL (ref 80–100)
Monocyte #: 0.5 x10 3/mm (ref 0.2–0.9)
Monocyte %: 8 %
Neutrophil #: 3.8 10*3/uL (ref 1.4–6.5)
Neutrophil %: 57.8 %
Platelet: 107 10*3/uL — ABNORMAL LOW (ref 150–440)
RBC: 3.55 10*6/uL — ABNORMAL LOW (ref 3.80–5.20)
RDW: 16.2 % — ABNORMAL HIGH (ref 11.5–14.5)
WBC: 6.5 10*3/uL (ref 3.6–11.0)

## 2012-02-26 LAB — CK TOTAL AND CKMB (NOT AT ARMC)
CK, Total: 34 U/L (ref 21–215)
CK-MB: <0.5 ng/mL — ABNORMAL LOW (ref 0.5–3.6)

## 2012-02-26 LAB — TROPONIN I: Troponin-I: <0.02 ng/mL

## 2012-02-26 LAB — PHOSPHORUS: Phosphorus: 3 mg/dL (ref 2.5–4.9)

## 2012-02-28 LAB — OCCULT BLOOD X 1 CARD TO LAB, STOOL: Occult Blood, Feces: NEGATIVE

## 2012-03-01 LAB — CBC WITH DIFFERENTIAL/PLATELET
Basophil #: 0 10*3/uL (ref 0.0–0.1)
Basophil %: 0.4 %
Eosinophil #: 0.7 10*3/uL (ref 0.0–0.7)
Eosinophil %: 14.7 %
HCT: 31.3 % — ABNORMAL LOW (ref 35.0–47.0)
HGB: 9.8 g/dL — ABNORMAL LOW (ref 12.0–16.0)
Lymphocyte #: 2.1 10*3/uL (ref 1.0–3.6)
Lymphocyte %: 40.8 %
MCH: 27.4 pg (ref 26.0–34.0)
MCHC: 31.2 g/dL — ABNORMAL LOW (ref 32.0–36.0)
MCV: 88 fL (ref 80–100)
Monocyte #: 0.4 x10 3/mm (ref 0.2–0.9)
Monocyte %: 8.7 %
Neutrophil #: 1.8 10*3/uL (ref 1.4–6.5)
Neutrophil %: 35.4 %
Platelet: 164 10*3/uL (ref 150–440)
RBC: 3.56 10*6/uL — ABNORMAL LOW (ref 3.80–5.20)
RDW: 15.8 % — ABNORMAL HIGH (ref 11.5–14.5)
WBC: 5.1 10*3/uL (ref 3.6–11.0)

## 2012-03-01 LAB — RENAL FUNCTION PANEL
Albumin: 3.2 g/dL — ABNORMAL LOW (ref 3.4–5.0)
Anion Gap: 9 (ref 7–16)
BUN: 22 mg/dL — ABNORMAL HIGH (ref 7–18)
Calcium, Total: 8.6 mg/dL (ref 8.5–10.1)
Chloride: 95 mmol/L — ABNORMAL LOW (ref 98–107)
Co2: 29 mmol/L (ref 21–32)
Creatinine: 6.81 mg/dL — ABNORMAL HIGH (ref 0.60–1.30)
EGFR (African American): 7 — ABNORMAL LOW
EGFR (Non-African Amer.): 6 — ABNORMAL LOW
Glucose: 153 mg/dL — ABNORMAL HIGH (ref 65–99)
Osmolality: 273 (ref 275–301)
Phosphorus: 3.6 mg/dL (ref 2.5–4.9)
Potassium: 4.5 mmol/L (ref 3.5–5.1)
Sodium: 133 mmol/L — ABNORMAL LOW (ref 136–145)

## 2012-03-03 LAB — PHOSPHORUS: Phosphorus: 3 mg/dL (ref 2.5–4.9)

## 2012-03-06 ENCOUNTER — Emergency Department: Payer: Self-pay | Admitting: Internal Medicine

## 2012-03-06 ENCOUNTER — Emergency Department: Payer: Self-pay | Admitting: Emergency Medicine

## 2012-03-06 LAB — CBC
HCT: 34.7 % — ABNORMAL LOW (ref 35.0–47.0)
HCT: 35.3 % (ref 35.0–47.0)
HGB: 10.9 g/dL — ABNORMAL LOW (ref 12.0–16.0)
HGB: 10.9 g/dL — ABNORMAL LOW (ref 12.0–16.0)
MCH: 27.5 pg (ref 26.0–34.0)
MCH: 27.2 pg (ref 26.0–34.0)
MCHC: 31.3 g/dL — ABNORMAL LOW (ref 32.0–36.0)
MCHC: 30.8 g/dL — ABNORMAL LOW (ref 32.0–36.0)
MCV: 88 fL (ref 80–100)
MCV: 88 fL (ref 80–100)
Platelet: 178 10*3/uL (ref 150–440)
Platelet: 156 10*3/uL (ref 150–440)
RBC: 3.96 10*6/uL (ref 3.80–5.20)
RBC: 4 10*6/uL (ref 3.80–5.20)
RDW: 17 % — ABNORMAL HIGH (ref 11.5–14.5)
RDW: 17.1 % — ABNORMAL HIGH (ref 11.5–14.5)
WBC: 6.4 10*3/uL (ref 3.6–11.0)
WBC: 6.3 10*3/uL (ref 3.6–11.0)

## 2012-03-06 LAB — URINALYSIS, COMPLETE
Bacteria: NONE SEEN
Bilirubin,UR: NEGATIVE
Blood: NEGATIVE
Glucose,UR: NEGATIVE mg/dL (ref 0–75)
Ketone: NEGATIVE
Leukocyte Esterase: NEGATIVE
Nitrite: NEGATIVE
Ph: 9 (ref 4.5–8.0)
Protein: >=500
RBC,UR: 11 /HPF (ref 0–5)
Specific Gravity: 1.007 (ref 1.003–1.030)
Squamous Epithelial: <1
WBC UR: <1 /HPF (ref 0–5)

## 2012-03-06 LAB — COMPREHENSIVE METABOLIC PANEL
Albumin: 3.7 g/dL (ref 3.4–5.0)
Albumin: 4 g/dL (ref 3.4–5.0)
Alkaline Phosphatase: 86 U/L (ref 50–136)
Alkaline Phosphatase: 91 U/L (ref 50–136)
Anion Gap: 8 (ref 7–16)
Anion Gap: 11 (ref 7–16)
BUN: 10 mg/dL (ref 7–18)
BUN: 14 mg/dL (ref 7–18)
Bilirubin,Total: 0.7 mg/dL (ref 0.2–1.0)
Bilirubin,Total: 0.7 mg/dL (ref 0.2–1.0)
Calcium, Total: 9.3 mg/dL (ref 8.5–10.1)
Calcium, Total: 9.4 mg/dL (ref 8.5–10.1)
Chloride: 100 mmol/L (ref 98–107)
Chloride: 97 mmol/L — ABNORMAL LOW (ref 98–107)
Co2: 29 mmol/L (ref 21–32)
Co2: 31 mmol/L (ref 21–32)
Creatinine: 4.43 mg/dL — ABNORMAL HIGH (ref 0.60–1.30)
Creatinine: 4.99 mg/dL — ABNORMAL HIGH (ref 0.60–1.30)
EGFR (African American): 12 — ABNORMAL LOW
EGFR (African American): 11 — ABNORMAL LOW
EGFR (Non-African Amer.): 11 — ABNORMAL LOW
EGFR (Non-African Amer.): 9 — ABNORMAL LOW
Glucose: 132 mg/dL — ABNORMAL HIGH (ref 65–99)
Glucose: 206 mg/dL — ABNORMAL HIGH (ref 65–99)
Osmolality: 275 (ref 275–301)
Osmolality: 284 (ref 275–301)
Potassium: 3.7 mmol/L (ref 3.5–5.1)
Potassium: 4 mmol/L (ref 3.5–5.1)
SGOT(AST): 23 U/L (ref 15–37)
SGOT(AST): 21 U/L (ref 15–37)
SGPT (ALT): 15 U/L
SGPT (ALT): 16 U/L
Sodium: 137 mmol/L (ref 136–145)
Sodium: 139 mmol/L (ref 136–145)
Total Protein: 7.4 g/dL (ref 6.4–8.2)
Total Protein: 7.8 g/dL (ref 6.4–8.2)

## 2012-03-06 LAB — LIPASE, BLOOD
Lipase: 40 U/L — ABNORMAL LOW (ref 73–393)
Lipase: 46 U/L — ABNORMAL LOW (ref 73–393)

## 2012-03-06 LAB — TROPONIN I: Troponin-I: <0.02 ng/mL

## 2012-03-09 ENCOUNTER — Emergency Department: Payer: Self-pay | Admitting: Emergency Medicine

## 2012-03-10 LAB — CBC WITH DIFFERENTIAL/PLATELET
Basophil #: 0 10*3/uL (ref 0.0–0.1)
Basophil %: 0.7 %
Eosinophil #: 0.5 10*3/uL (ref 0.0–0.7)
Eosinophil %: 8 %
HCT: 36.1 % (ref 35.0–47.0)
HGB: 11.4 g/dL — ABNORMAL LOW (ref 12.0–16.0)
Lymphocyte #: 2.9 10*3/uL (ref 1.0–3.6)
Lymphocyte %: 43.3 %
MCH: 27.5 pg (ref 26.0–34.0)
MCHC: 31.5 g/dL — ABNORMAL LOW (ref 32.0–36.0)
MCV: 87 fL (ref 80–100)
Monocyte #: 0.8 x10 3/mm (ref 0.2–0.9)
Monocyte %: 11.8 %
Neutrophil #: 2.4 10*3/uL (ref 1.4–6.5)
Neutrophil %: 36.2 %
Platelet: 146 10*3/uL — ABNORMAL LOW (ref 150–440)
RBC: 4.13 10*6/uL (ref 3.80–5.20)
RDW: 17.1 % — ABNORMAL HIGH (ref 11.5–14.5)
WBC: 6.7 10*3/uL (ref 3.6–11.0)

## 2012-03-10 LAB — COMPREHENSIVE METABOLIC PANEL
Albumin: 3.8 g/dL (ref 3.4–5.0)
Alkaline Phosphatase: 76 U/L (ref 50–136)
Anion Gap: 11 (ref 7–16)
BUN: 39 mg/dL — ABNORMAL HIGH (ref 7–18)
Bilirubin,Total: 0.7 mg/dL (ref 0.2–1.0)
Calcium, Total: 8.9 mg/dL (ref 8.5–10.1)
Chloride: 100 mmol/L (ref 98–107)
Co2: 28 mmol/L (ref 21–32)
Creatinine: 9.82 mg/dL — ABNORMAL HIGH (ref 0.60–1.30)
EGFR (African American): 5 — ABNORMAL LOW
EGFR (Non-African Amer.): 4 — ABNORMAL LOW
Glucose: 147 mg/dL — ABNORMAL HIGH (ref 65–99)
Osmolality: 290 (ref 275–301)
Potassium: 4.1 mmol/L (ref 3.5–5.1)
SGOT(AST): 16 U/L (ref 15–37)
SGPT (ALT): 12 U/L
Sodium: 139 mmol/L (ref 136–145)
Total Protein: 7.3 g/dL (ref 6.4–8.2)

## 2012-04-29 IMAGING — XA IR VASCULAR PROCEDURE
9 of 10 series · 15 of 17 positions shown · non-contrast
Comparison: none

[Series 1: care upper arm · 2 of 2 slices shown (1 of 7)]
[im 1/2]
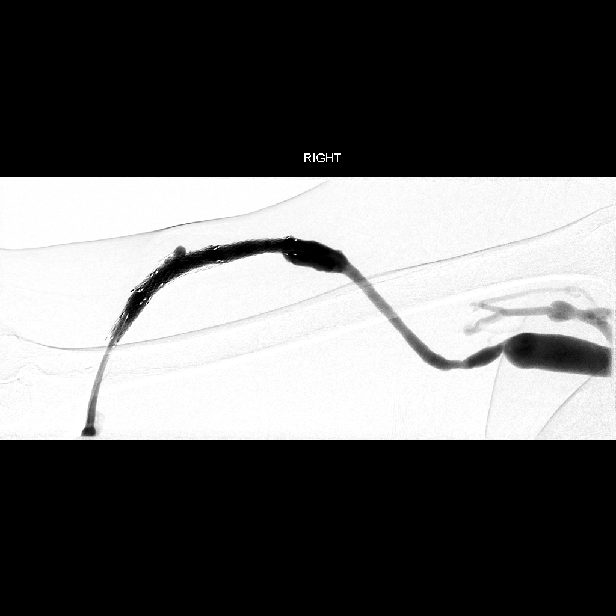
[im 2/2]
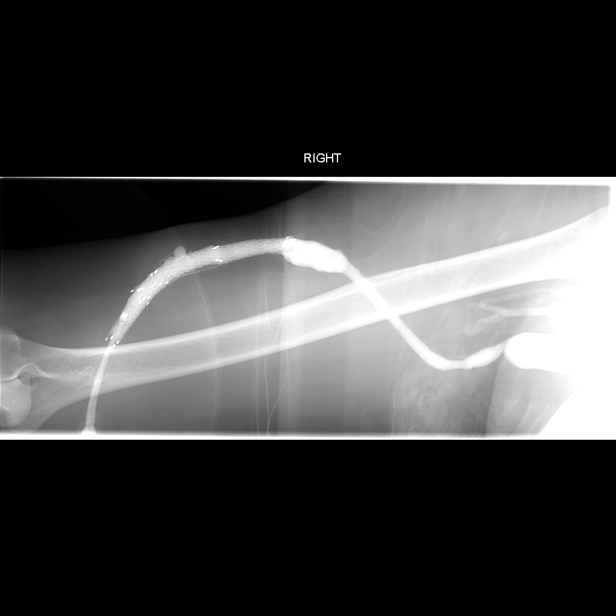

[Series 2: care upper arm · 2 of 2 slices shown (2 of 7)]
[im 1/2]
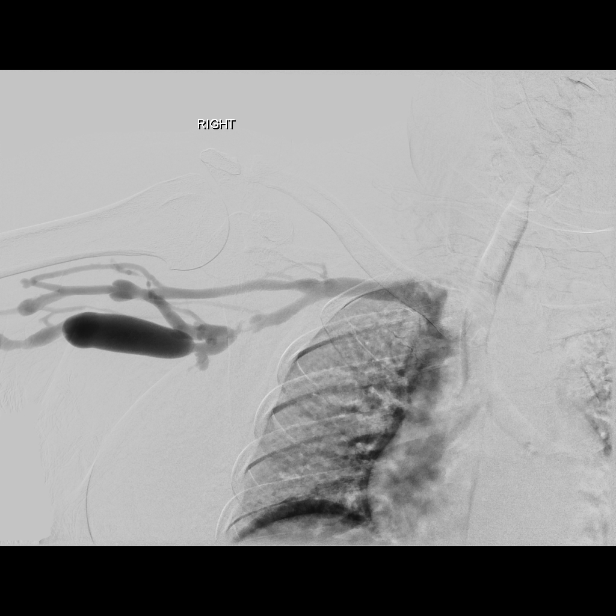
[im 2/2]
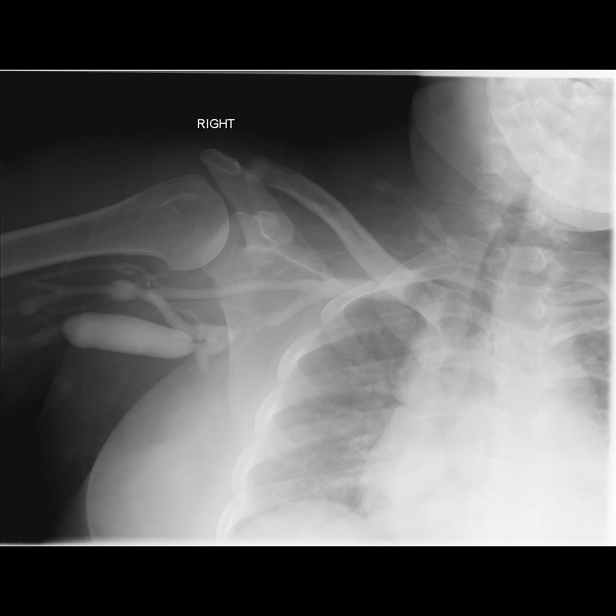

[Series 4: fl - angio · 1 of 1 slices shown (1 of 2)]
[im 1/1]
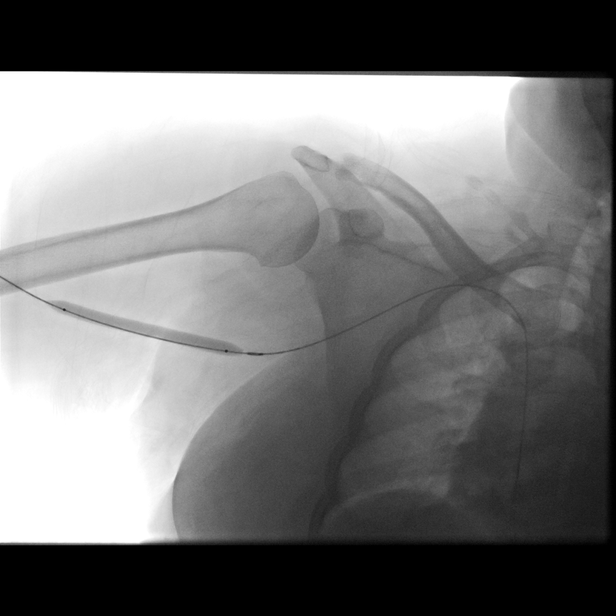

[Series 5: fl - angio · 1 of 1 slices shown (2 of 2)]
[im 1/1]
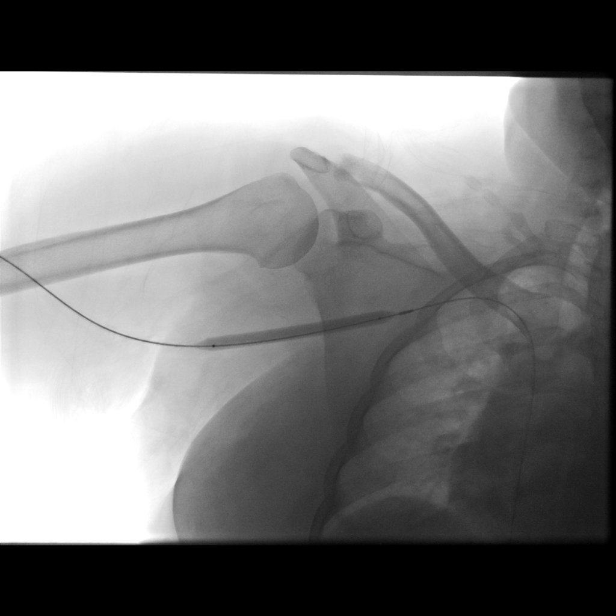

[Series 6: care upper arm · 2 of 2 slices shown (3 of 7)]
[im 1/2]
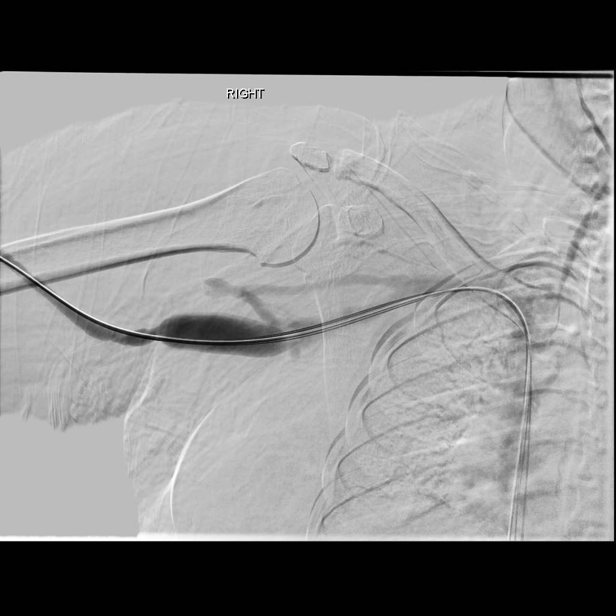
[im 2/2]
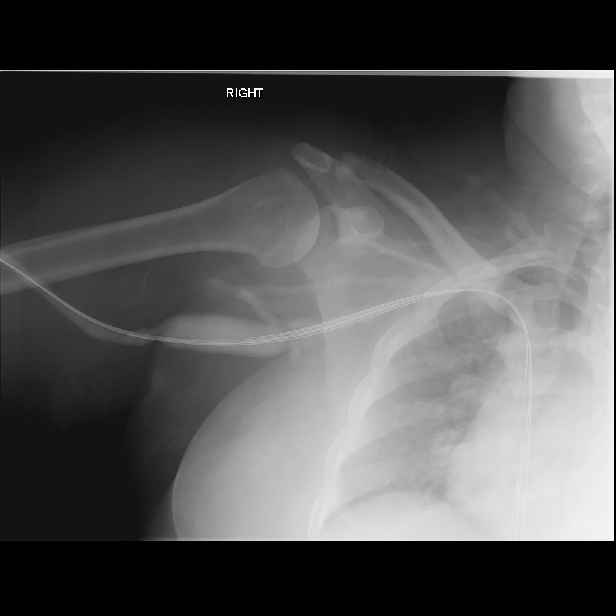

[Series 7: care upper arm · 2 of 2 slices shown (4 of 7)]
[im 1/2]
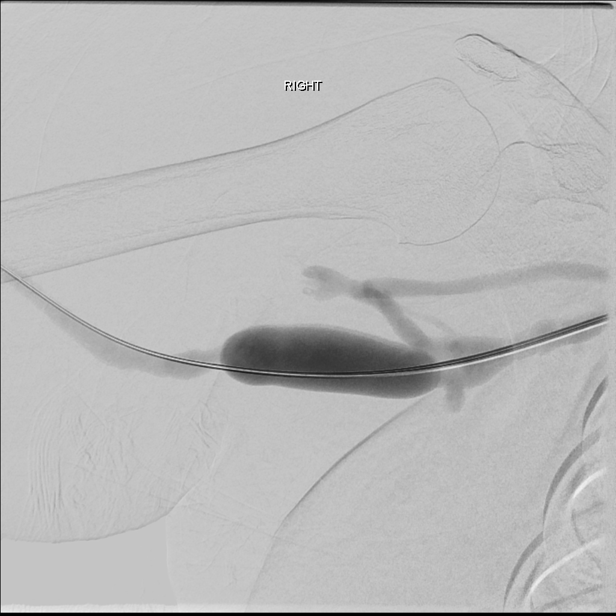
[im 2/2]
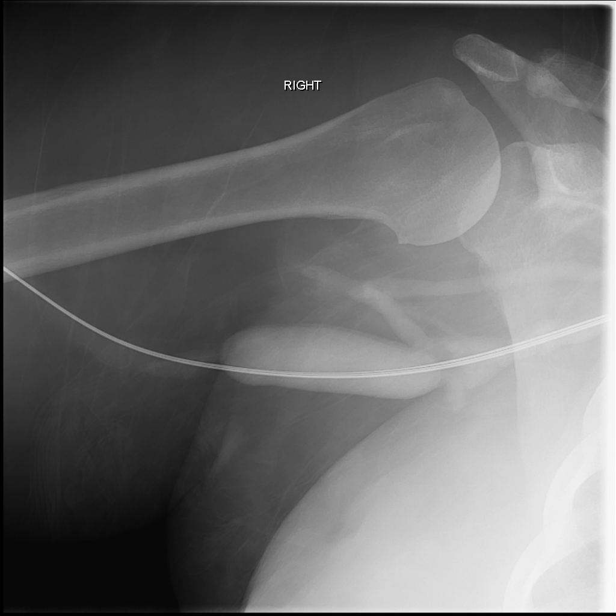

[Series 8: care upper arm · 1 of 2 slices shown (5 of 7)]
[im 1/2]
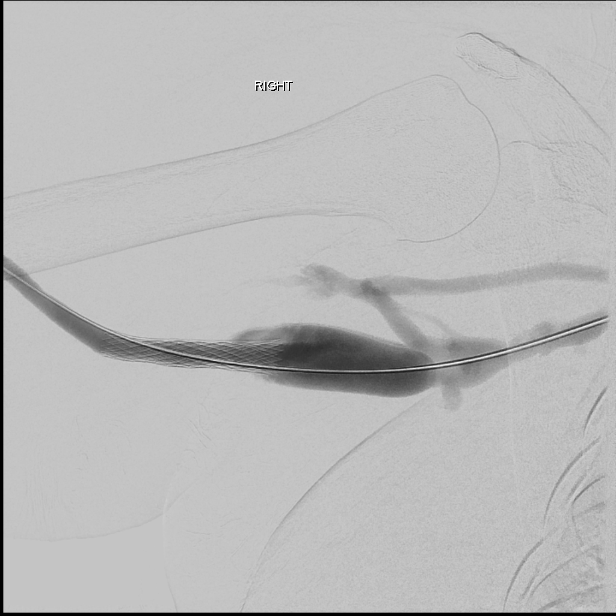

[Series 9: care upper arm · 2 of 2 slices shown (6 of 7)]
[im 1/2]
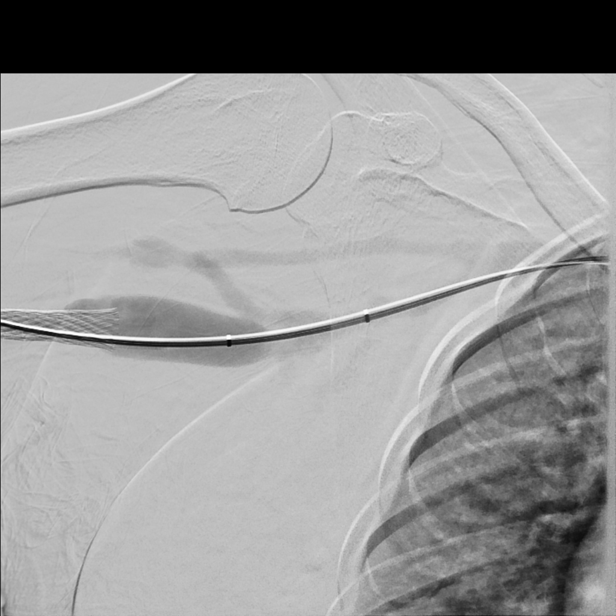
[im 2/2]
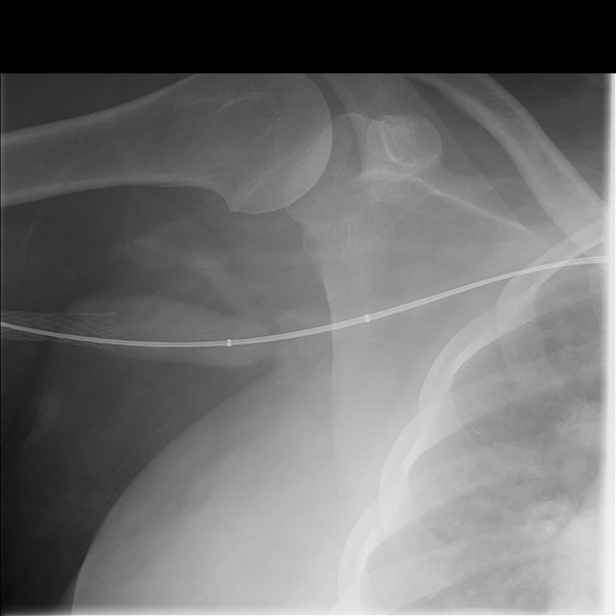

[Series 10: care upper arm · 2 of 2 slices shown (7 of 7)]
[im 1/2]
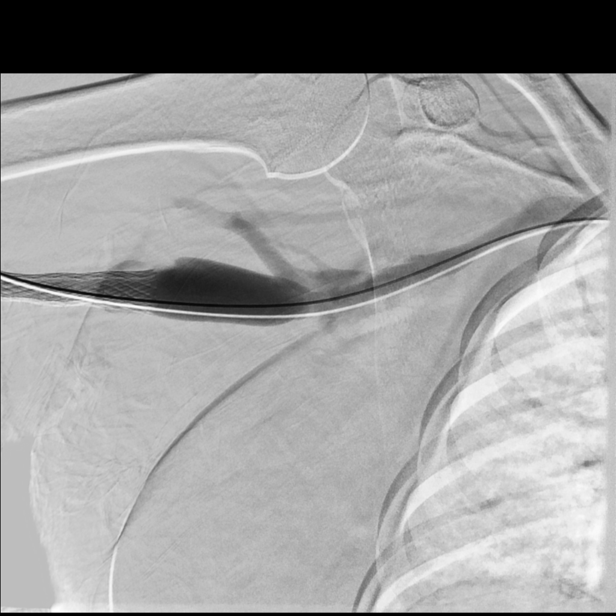
[im 2/2]
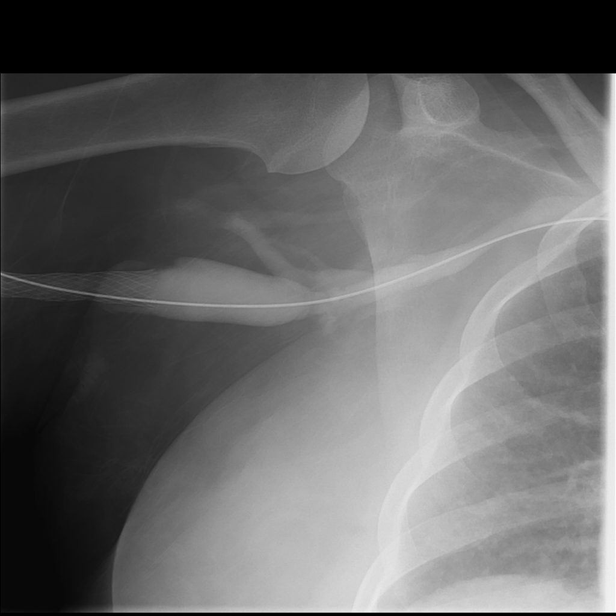

[15 of 17 positions shown; findings below may reference images not displayed]

IMAGES IMPORTED FROM THE [employer] WORK[employer]OW SYSTEM
NO DICTATION FOR STUDY

## 2012-05-13 IMAGING — CR DG SHOULDER 3+V*L*
1 series · 3 of 3 positions shown · non-contrast
Comparison: none

REASON FOR EXAM: fall, limited ROM
COMMENTS:

PROCEDURE:     DXR - DXR SHOULDER LEFT COMPLETE  - January 06, 2012  [DATE]
RESULT:     No fracture, dislocation or other acute bony abnormality is
identified.

[Series 1: w shoulder external left · 0.14mm/px · 3 of 3 slices shown]
[im 1/3]
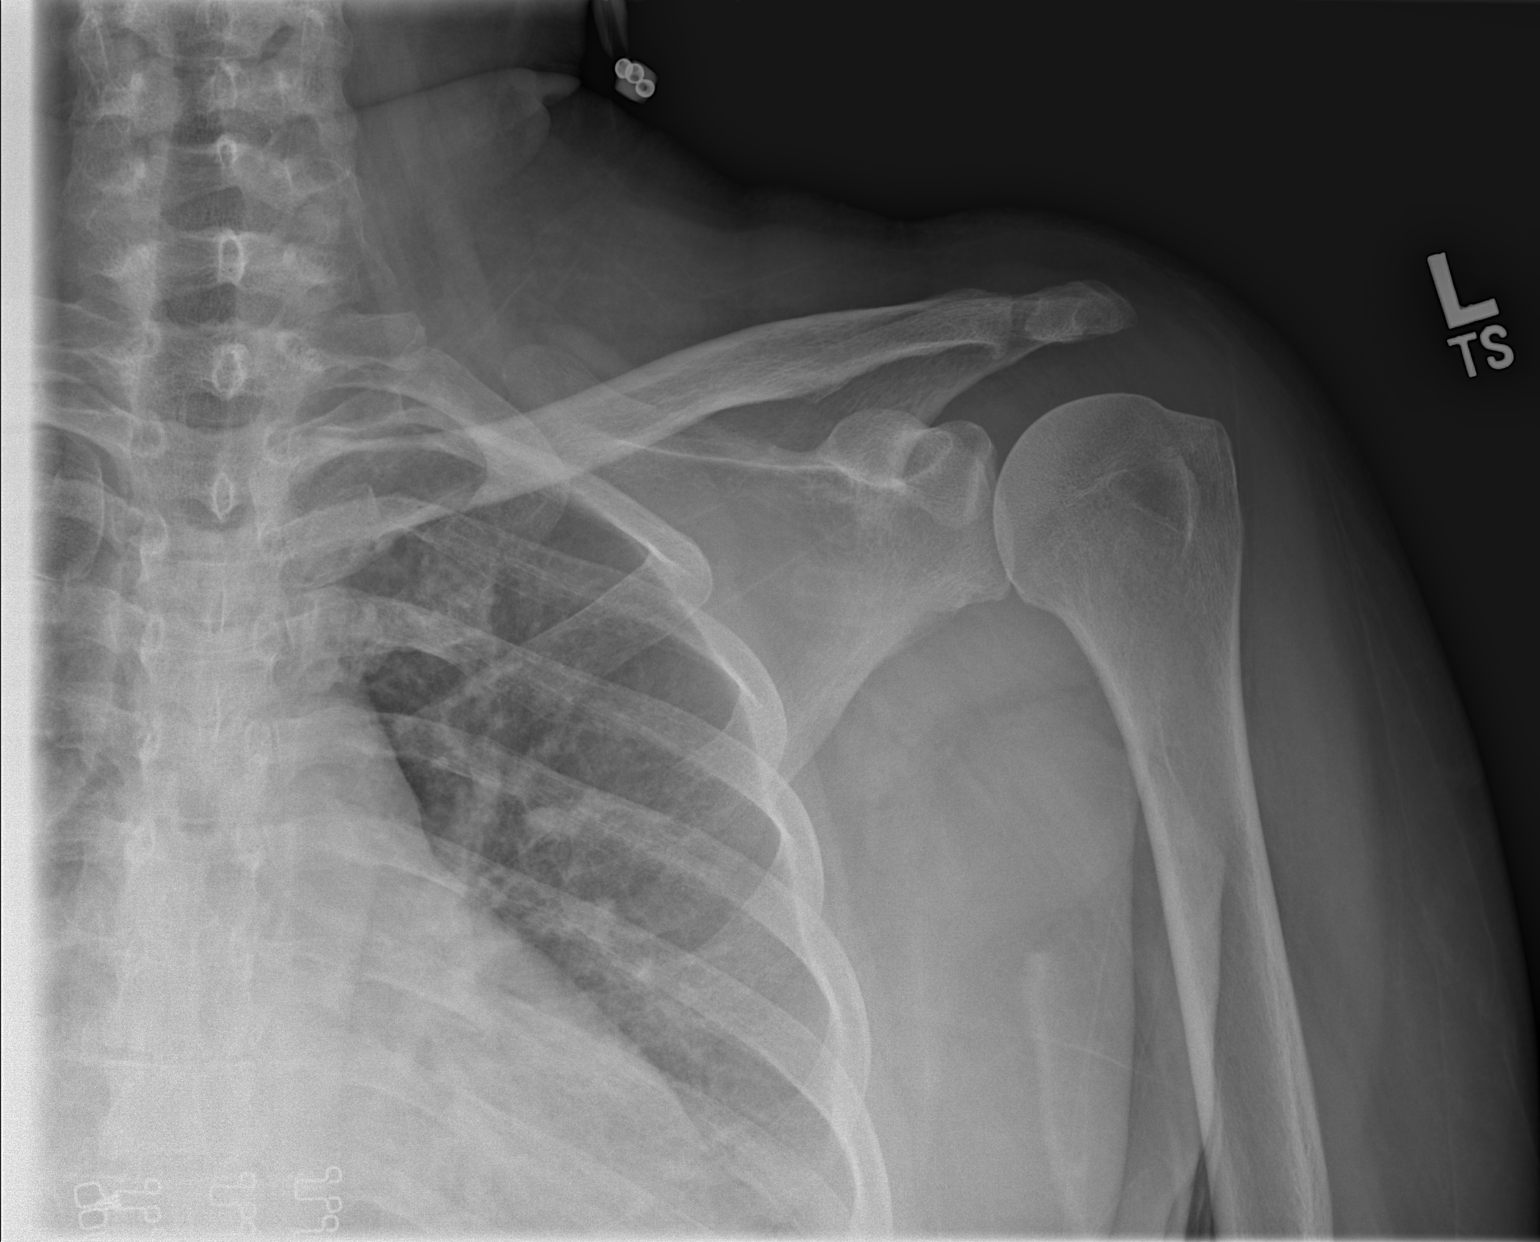
[im 2/3]
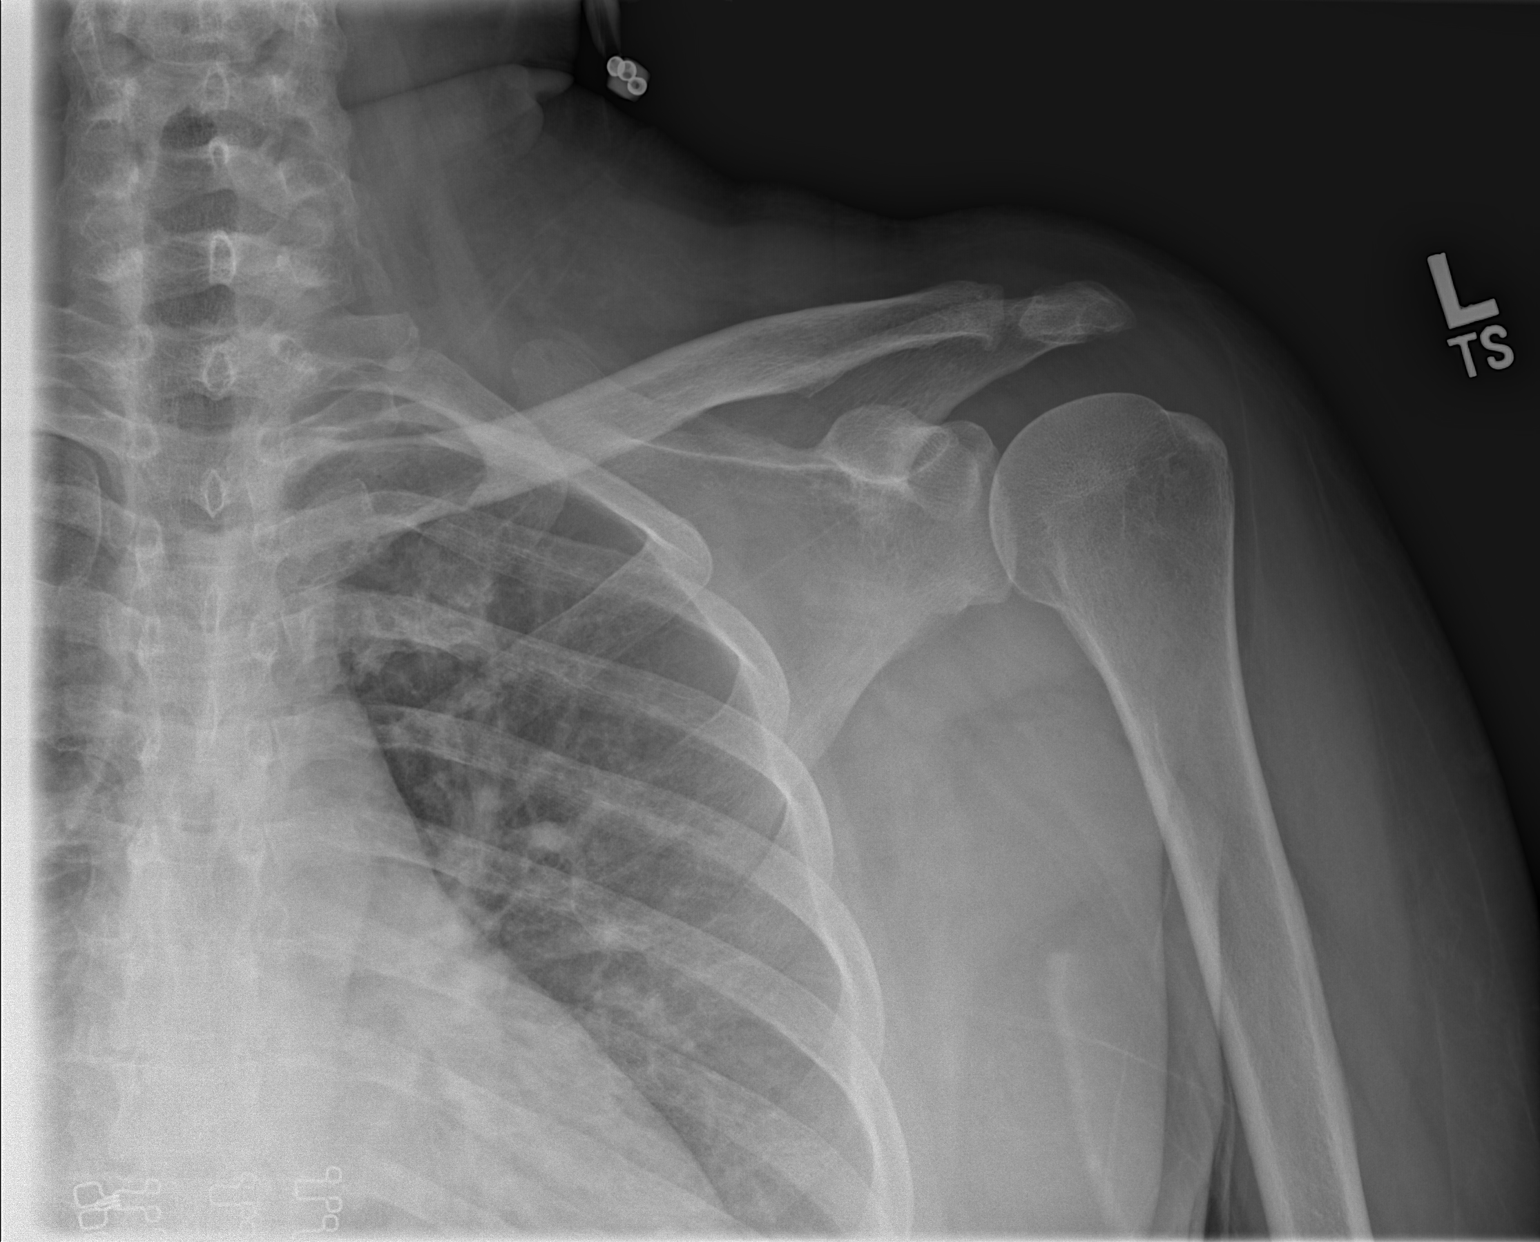
[im 3/3]
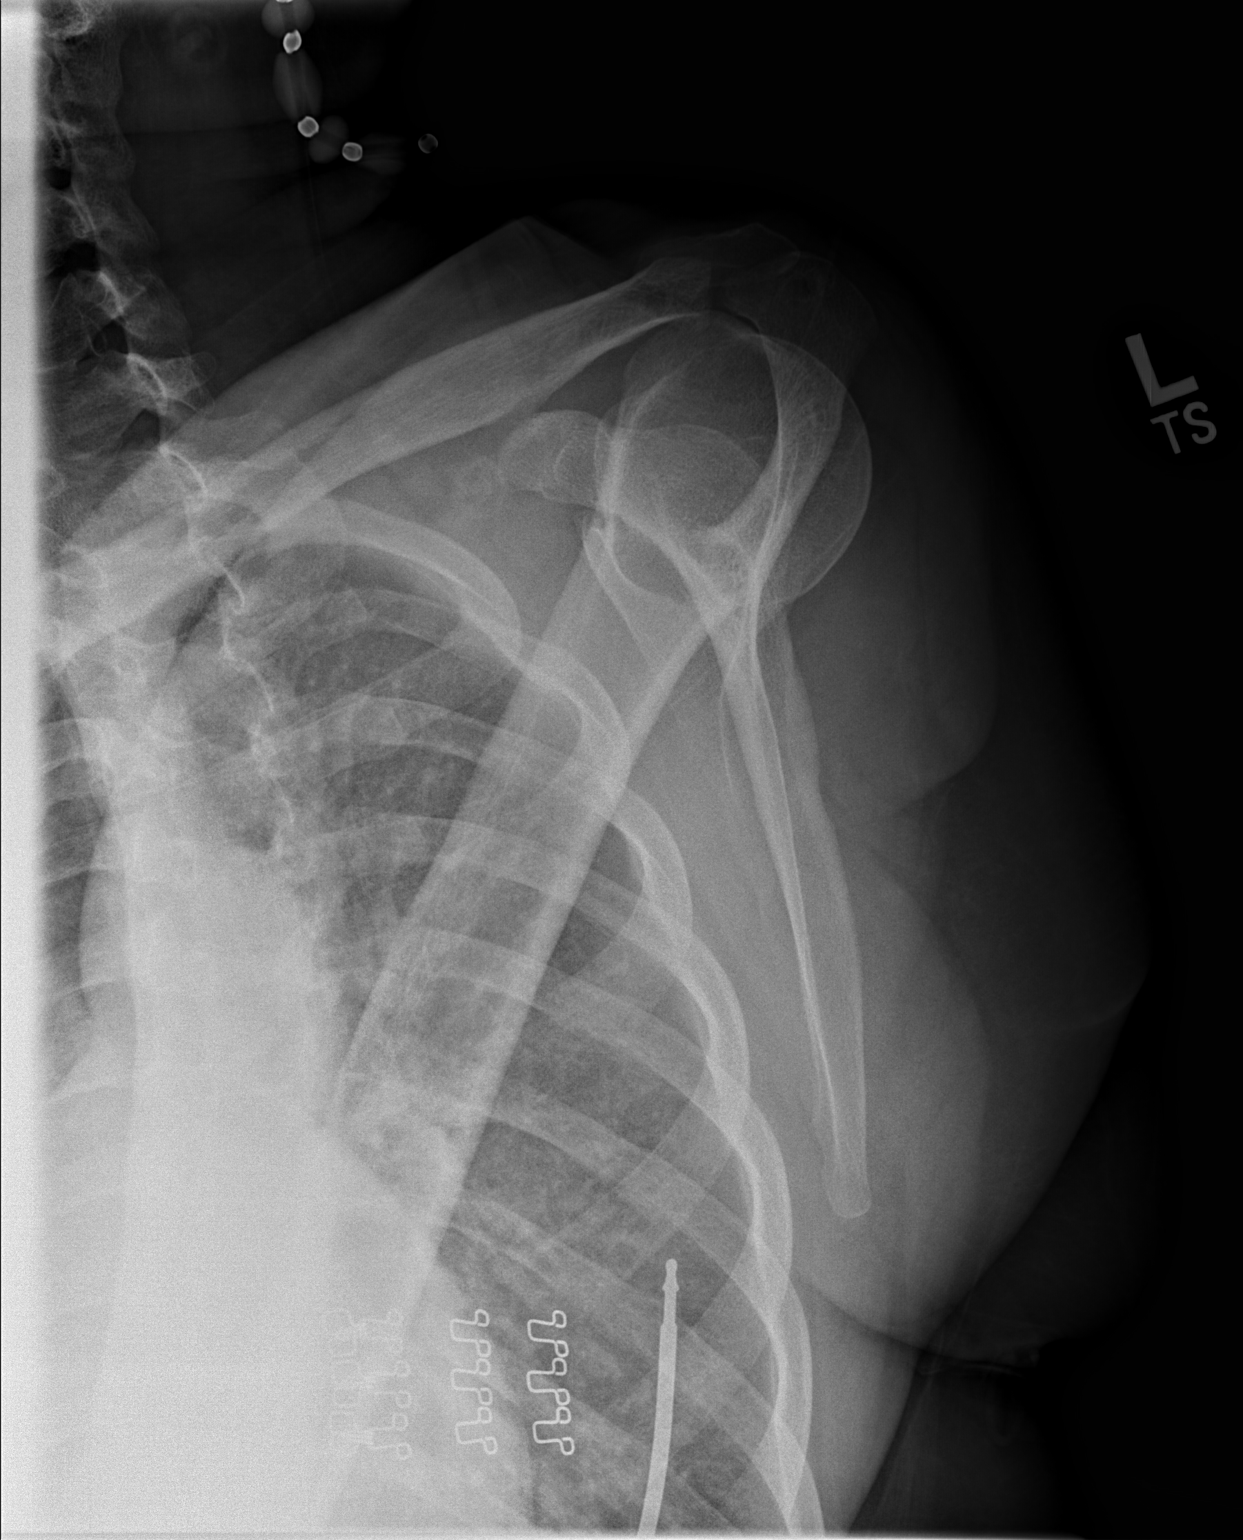

[3 of 3 positions shown; findings below may reference images not displayed]

IMPRESSION: 1.     No significant osseous abnormalities are noted.

## 2012-05-13 IMAGING — CT CT CERVICAL SPINE WITHOUT CONTRAST
1 series · 12 of 14 positions shown, 15 images · non-contrast
Comparison: none

REASON FOR EXAM: s/p fall
COMMENTS:

PROCEDURE:     CT  - CT CERVICAL SPINE WO  - January 06, 2012  [DATE]
RESULT:
HISTORY: Fall.
Comparison Study: No recent.

[Series 4: axial · axial · 0.33mm/px · z∈[-314,-158]mm · 12 of 98 slices shown, 15 images]
[im 8/98  soft-tissue]
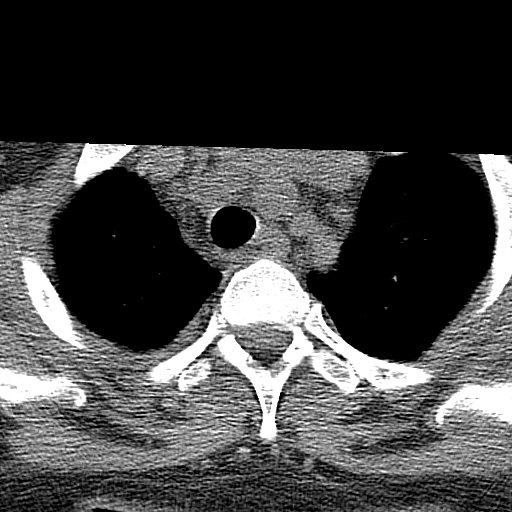
[im 8/98  bone]
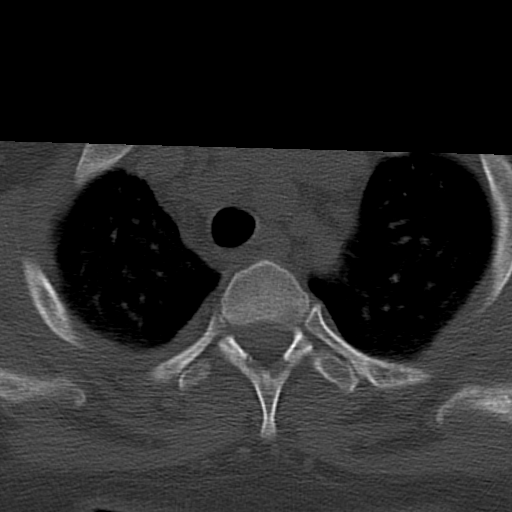
[im 15/98  bone]
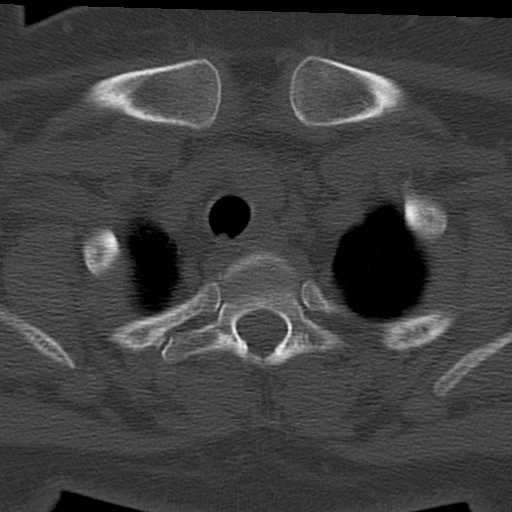
[im 23/98  bone]
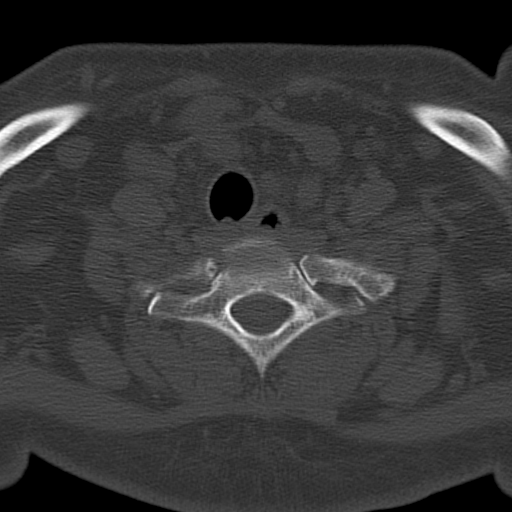
[im 30/98  bone]
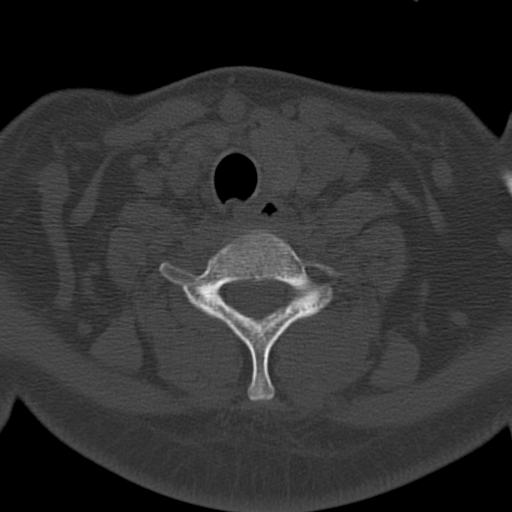
[im 38/98  soft-tissue]
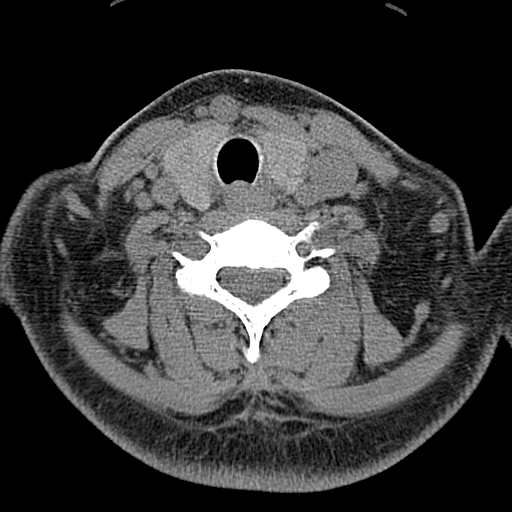
[im 38/98  bone]
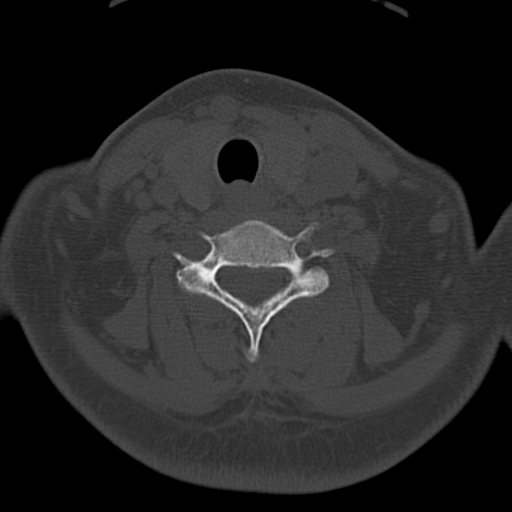
[im 45/98  bone]
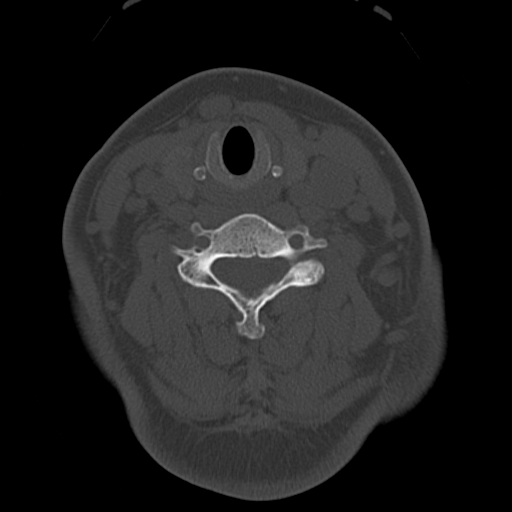
[im 53/98  bone]
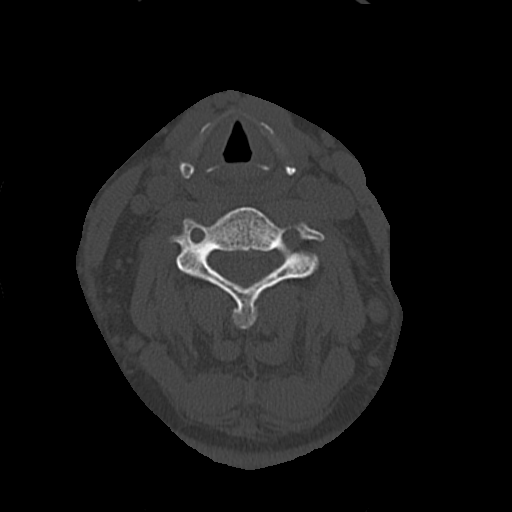
[im 60/98  bone]
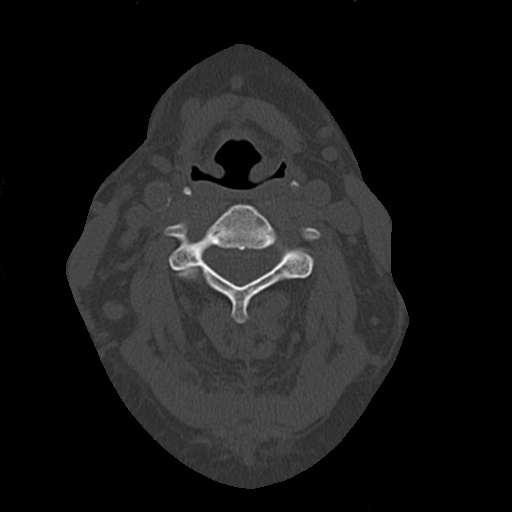
[im 68/98  soft-tissue]
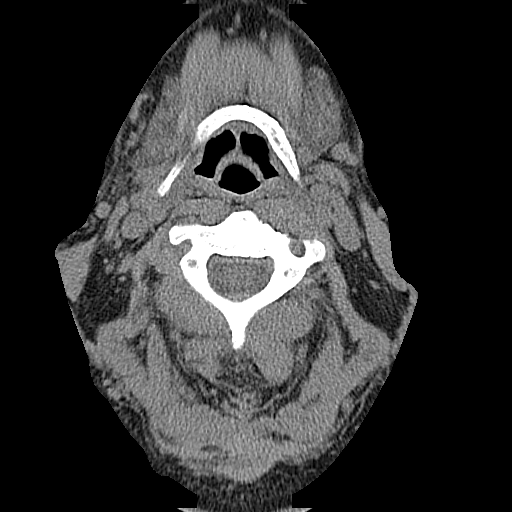
[im 68/98  bone]
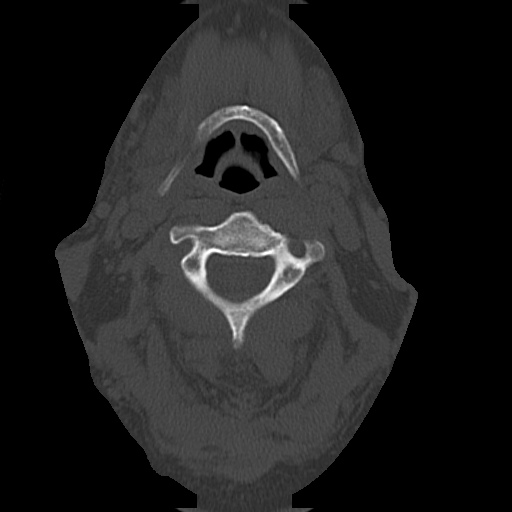
[im 75/98  bone]
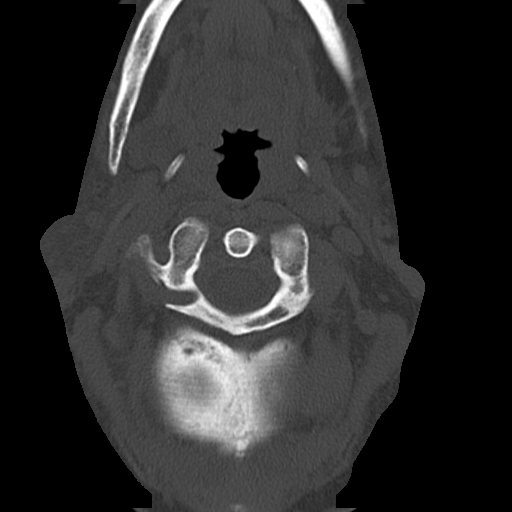
[im 83/98  bone]
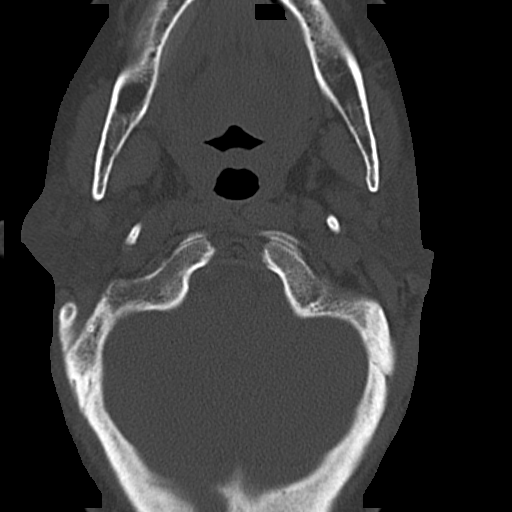
[im 90/98  bone]
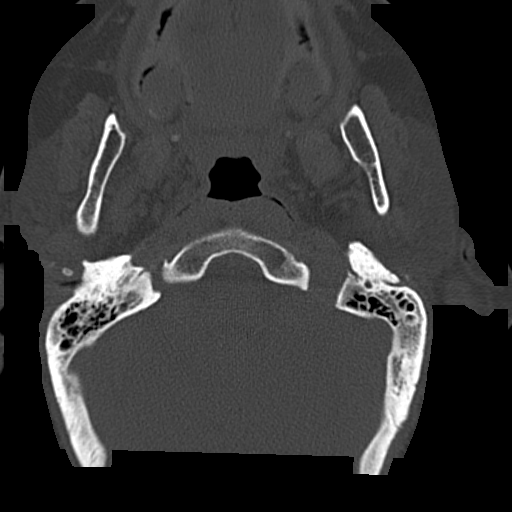

[12 of 14 positions shown; findings below may reference images not displayed]

FINDINGS: Standard CT of the cervical spine was obtained. No evidence of
fracture or dislocation. No acute abnormality identified.
IMPRESSION: No acute abnormality.

## 2012-05-13 IMAGING — CT CT MAXILLOFACIAL WITHOUT CONTRAST
1 series · 16 of 30 positions shown, 20 images · non-contrast
Comparison: none

REASON FOR EXAM: fall w/ bruising L eye
COMMENTS:

PROCEDURE:     CT  - CT MAXILLOFACIAL AREA WO  - January 06, 2012  [DATE]
RESULT:     History: Trauma.

[Series 2: facial 3.0 h60f · axial · 0.31mm/px · z∈[-202,-49]mm · 16 of 55 slices shown, 20 images]
[im 2/55  brain]
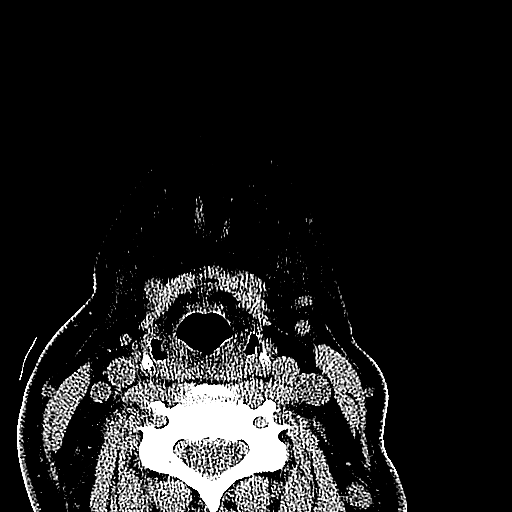
[im 2/55  bone]
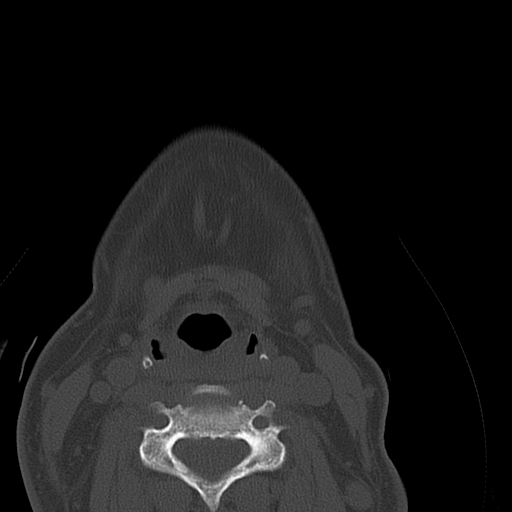
[im 6/55  bone]
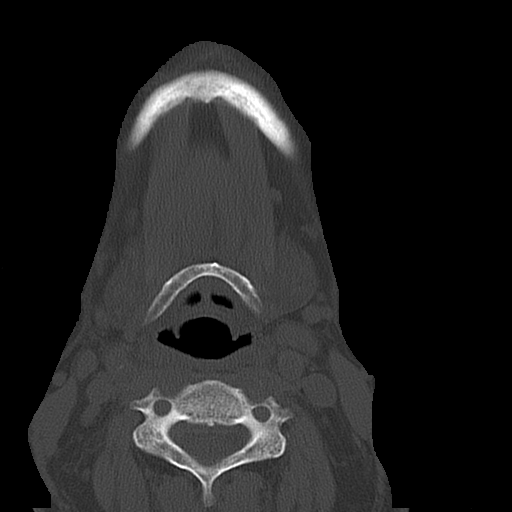
[im 10/55  bone]
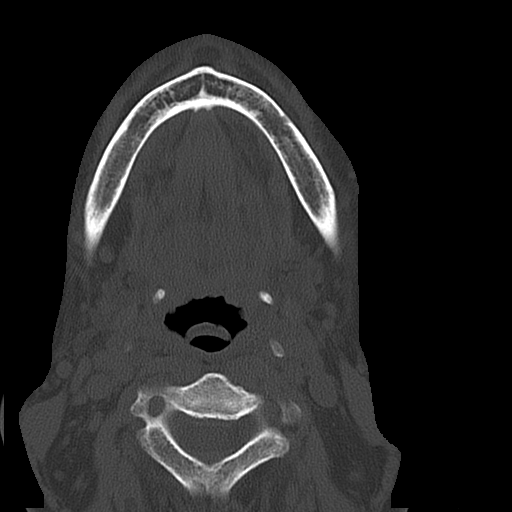
[im 14/55  bone]
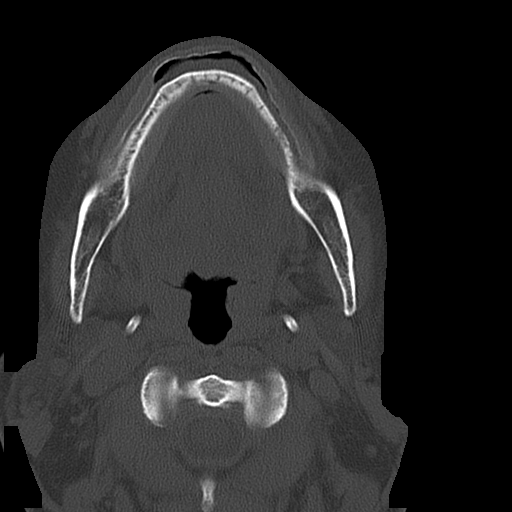
[im 15/55  brain]
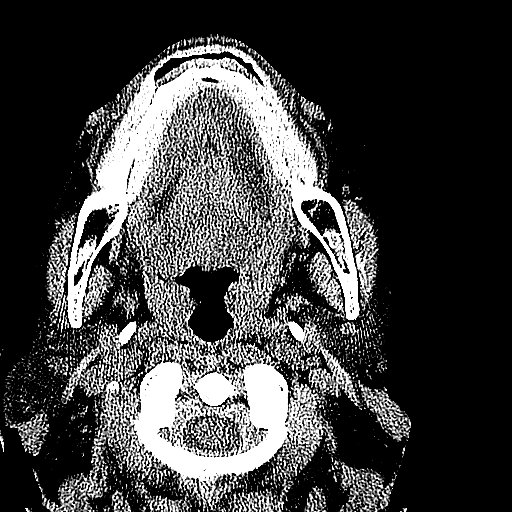
[im 15/55  bone]
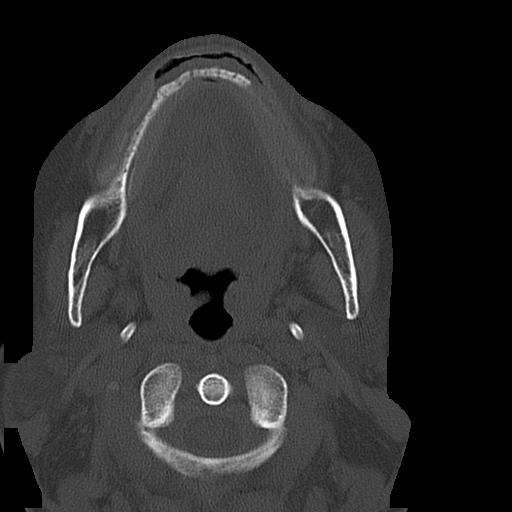
[im 19/55  bone]
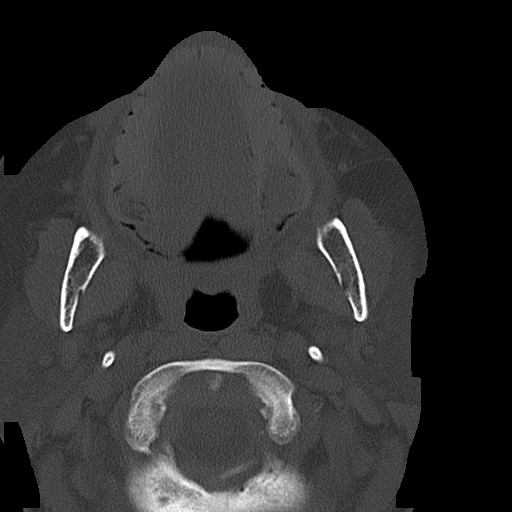
[im 23/55  bone]
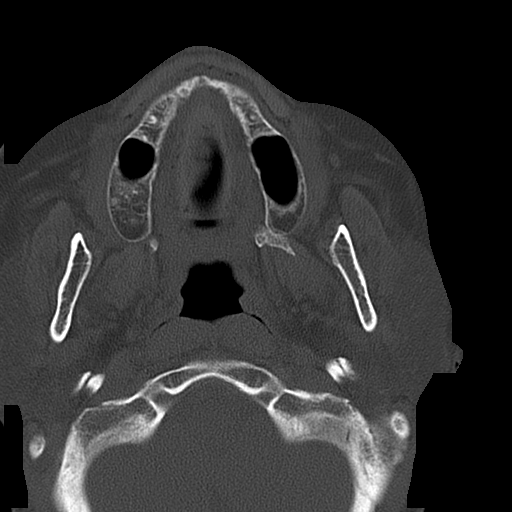
[im 27/55  bone]
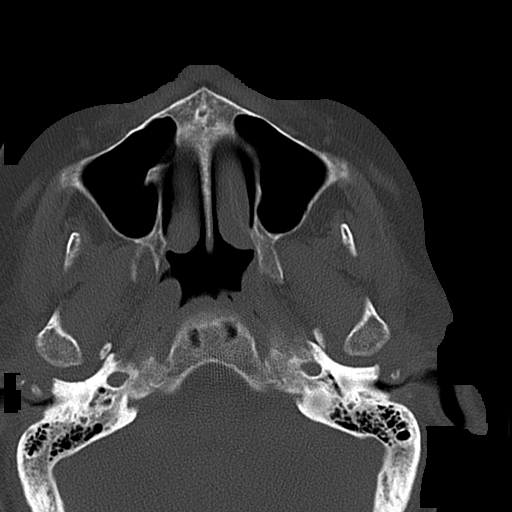
[im 28/55  brain]
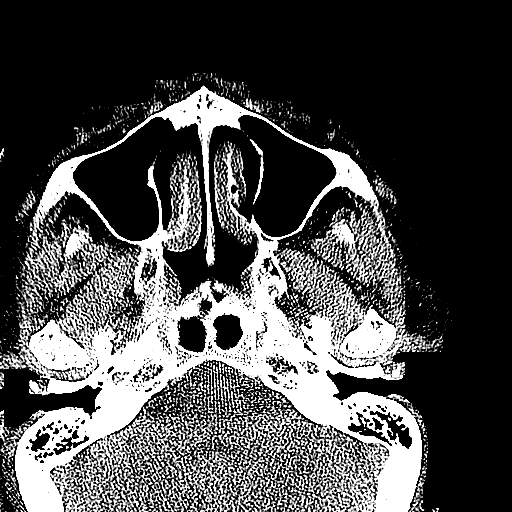
[im 28/55  bone]
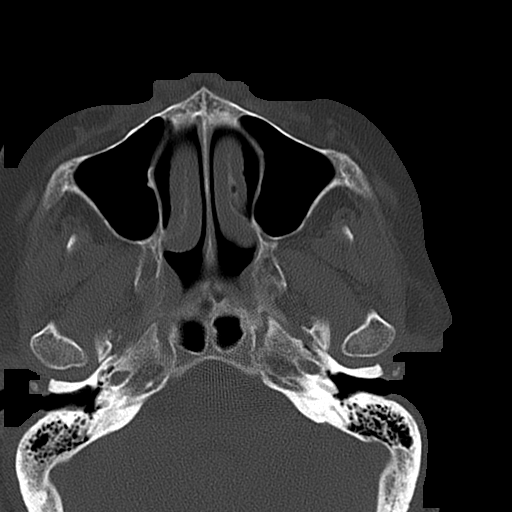
[im 32/55  bone]
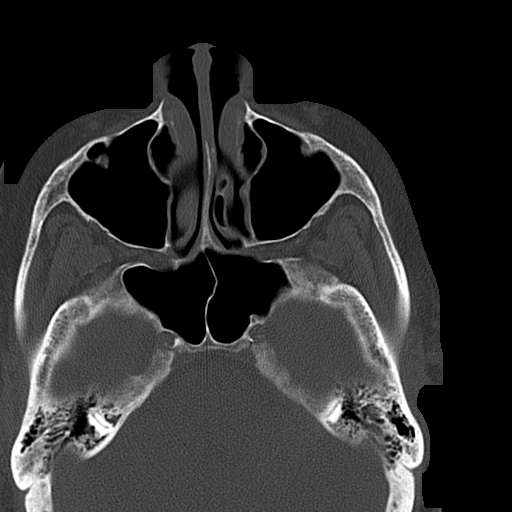
[im 36/55  bone]
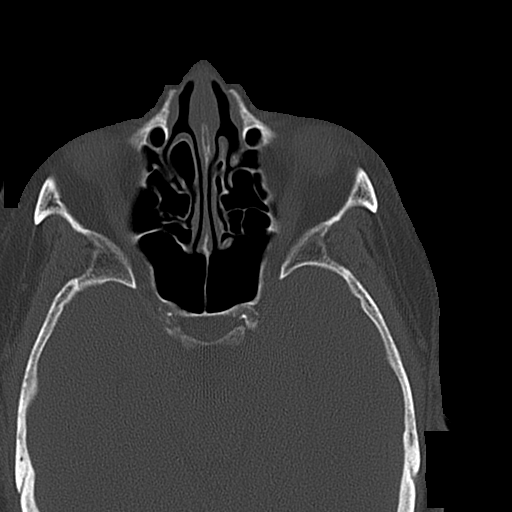
[im 40/55  bone]
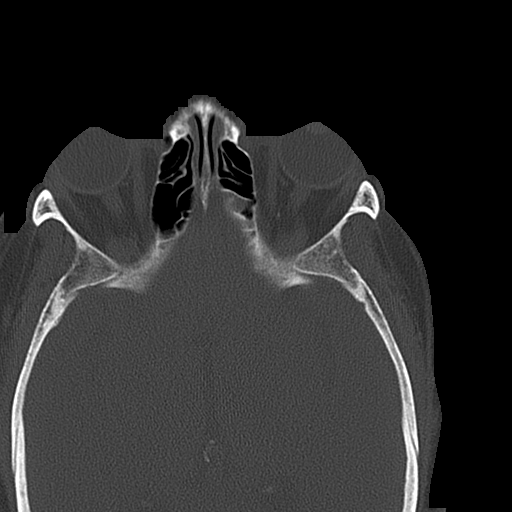
[im 41/55  brain]
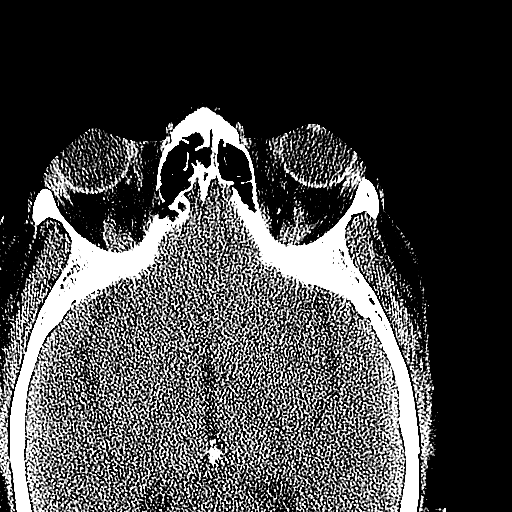
[im 41/55  bone]
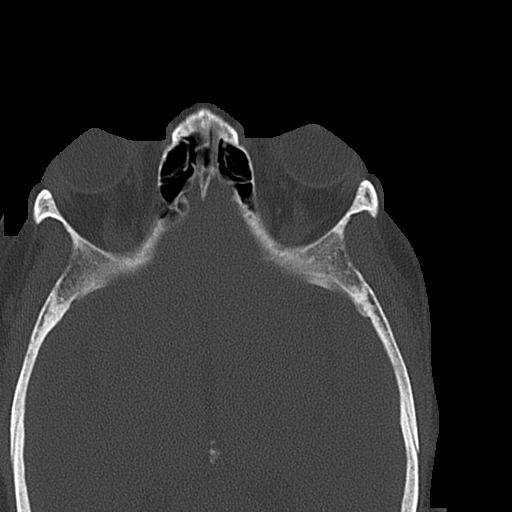
[im 45/55  bone]
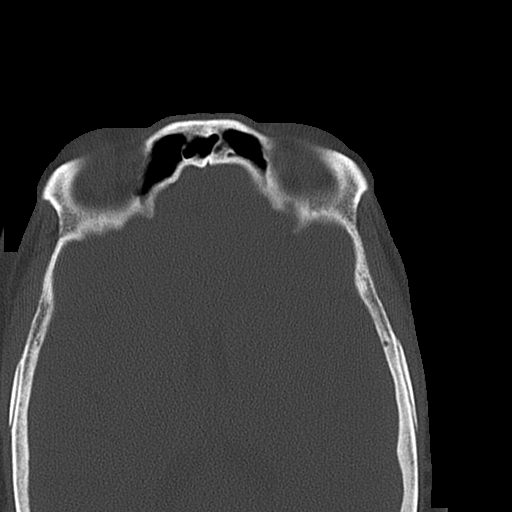
[im 49/55  bone]
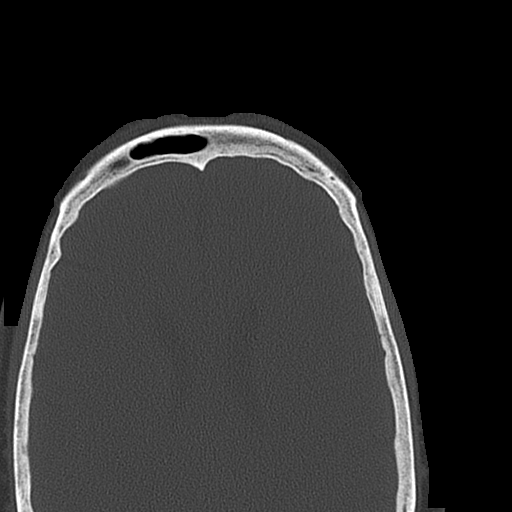
[im 53/55  bone]
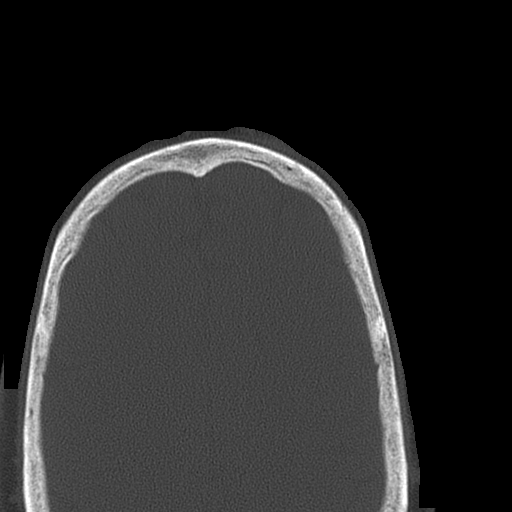

[16 of 30 positions shown; findings below may reference images not displayed]

FINDINGS: Standard CT obtained. No evidence of fracture. Paranasal sinuses
clear. Orbits are normal.
IMPRESSION: No acute abnormality.

## 2012-05-14 IMAGING — XA IR VASCULAR PROCEDURE
1 series · 1 of 1 positions shown · non-contrast
Comparison: none

[Series 1: single · 1 of 1 slices shown]
[im 1/1]
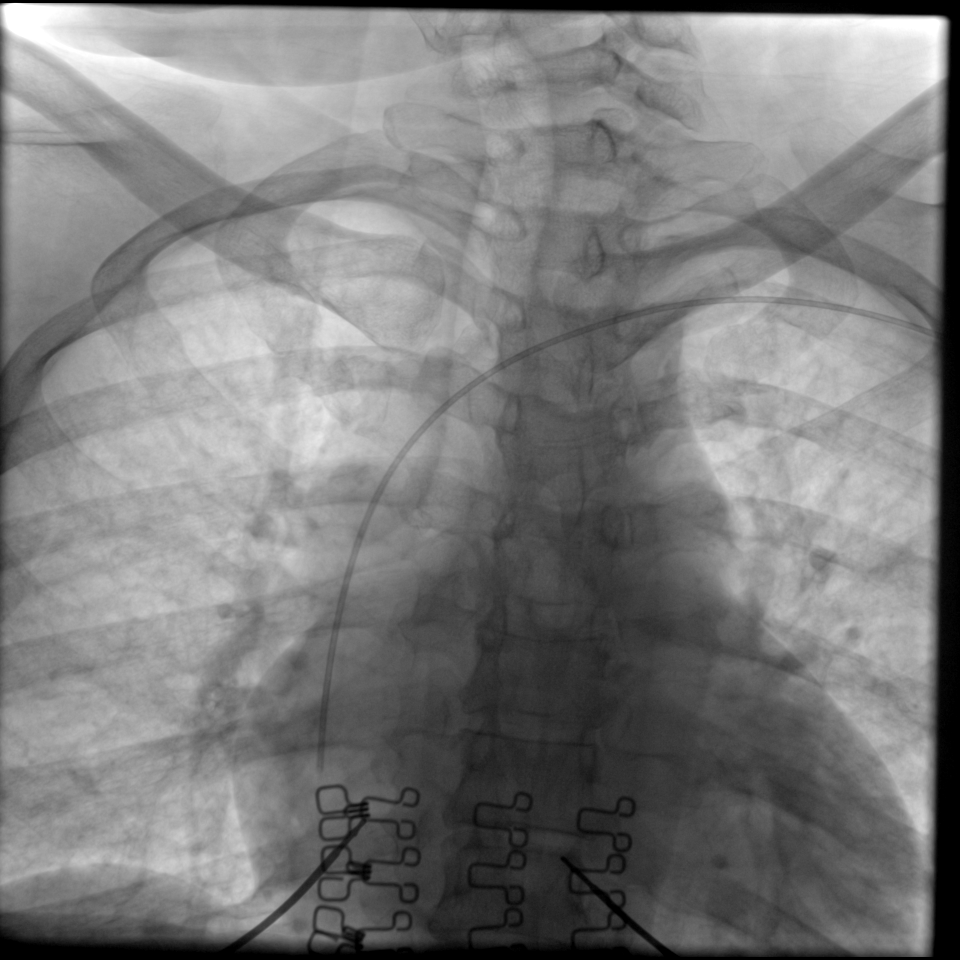

[1 of 1 positions shown; findings below may reference images not displayed]

IMAGES IMPORTED FROM THE [employer] WORK[employer]OW SYSTEM
NO DICTATION FOR STUDY

## 2012-07-02 ENCOUNTER — Ambulatory Visit: Payer: Self-pay | Admitting: Vascular Surgery

## 2012-07-02 IMAGING — CR DG CHEST 2V
1 series · 2 of 2 positions shown · non-contrast
Comparison: none

REASON FOR EXAM: Shortness of Breath
COMMENTS:   May transport without cardiac monitor

PROCEDURE:     DXR - DXR CHEST PA (OR AP) AND LATERAL  - February 25, 2012 [DATE]
RESULT:     Comparison: 10/12/2011

[Series 1: ap · 0.17mm/px · 2 of 2 slices shown]
[im 1/2]
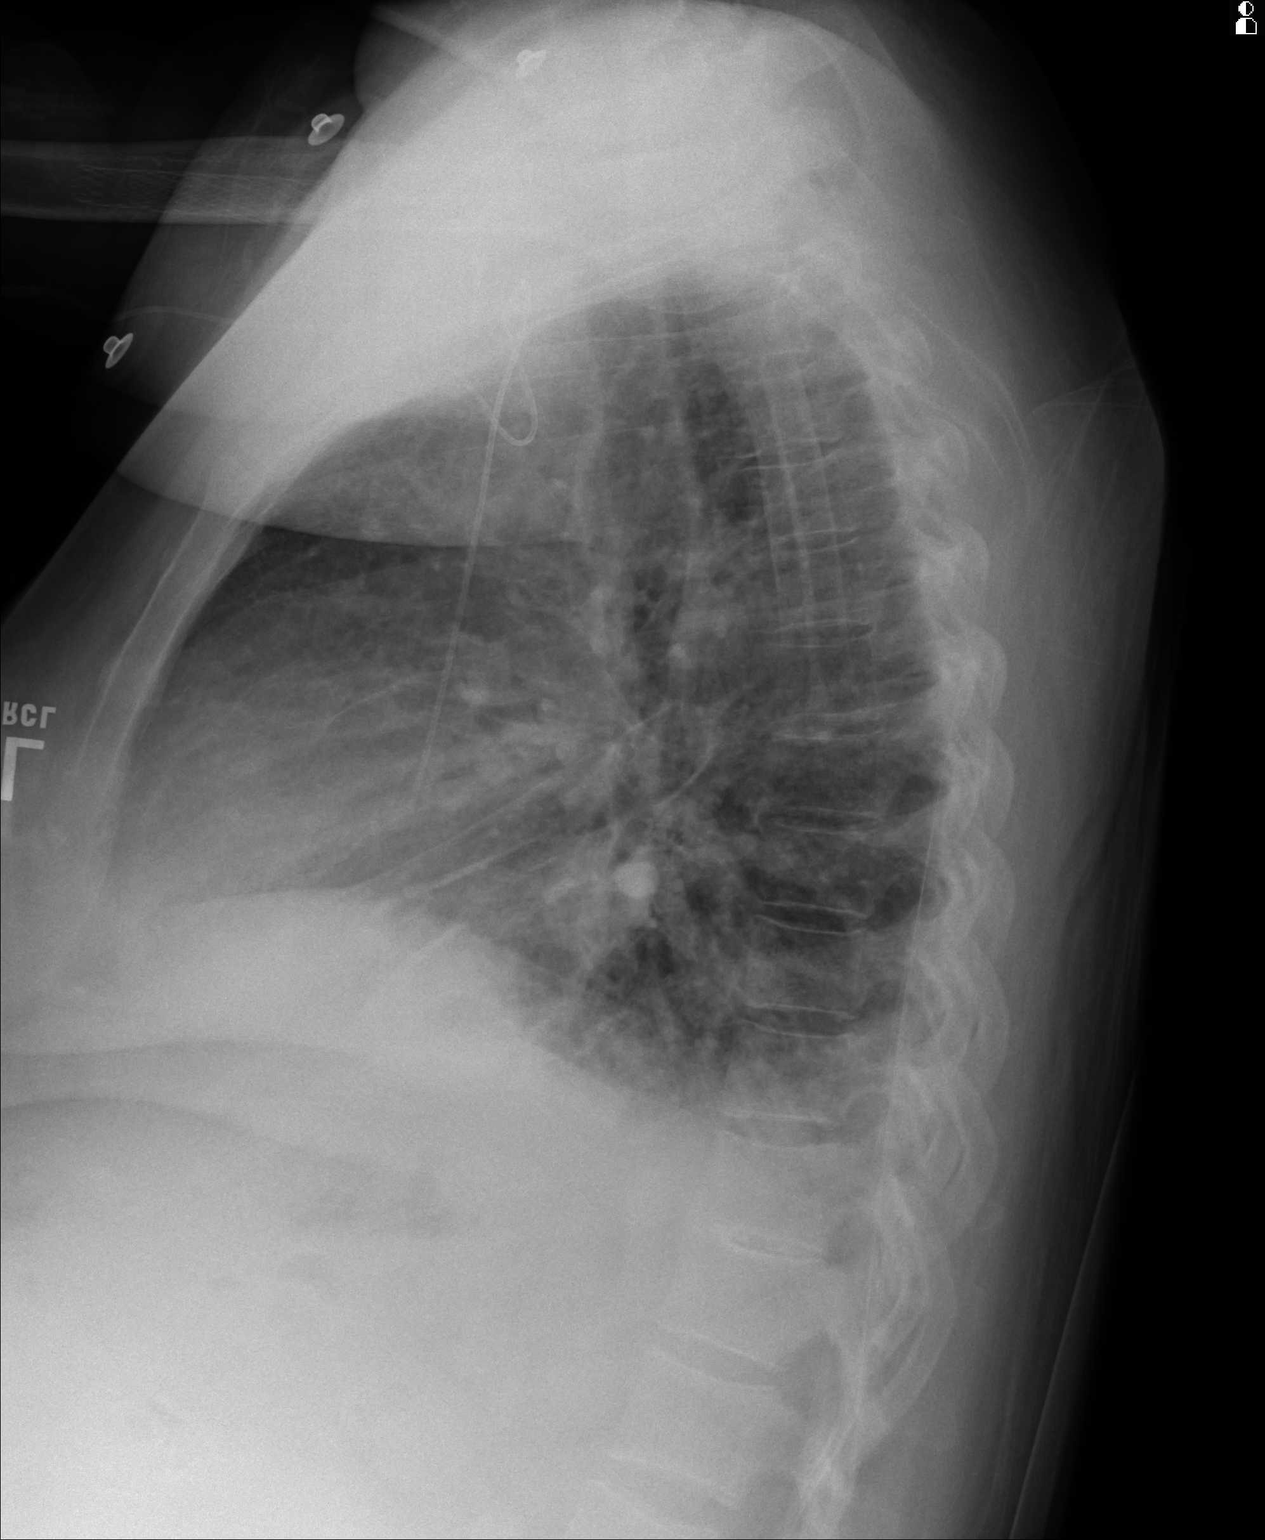
[im 2/2]
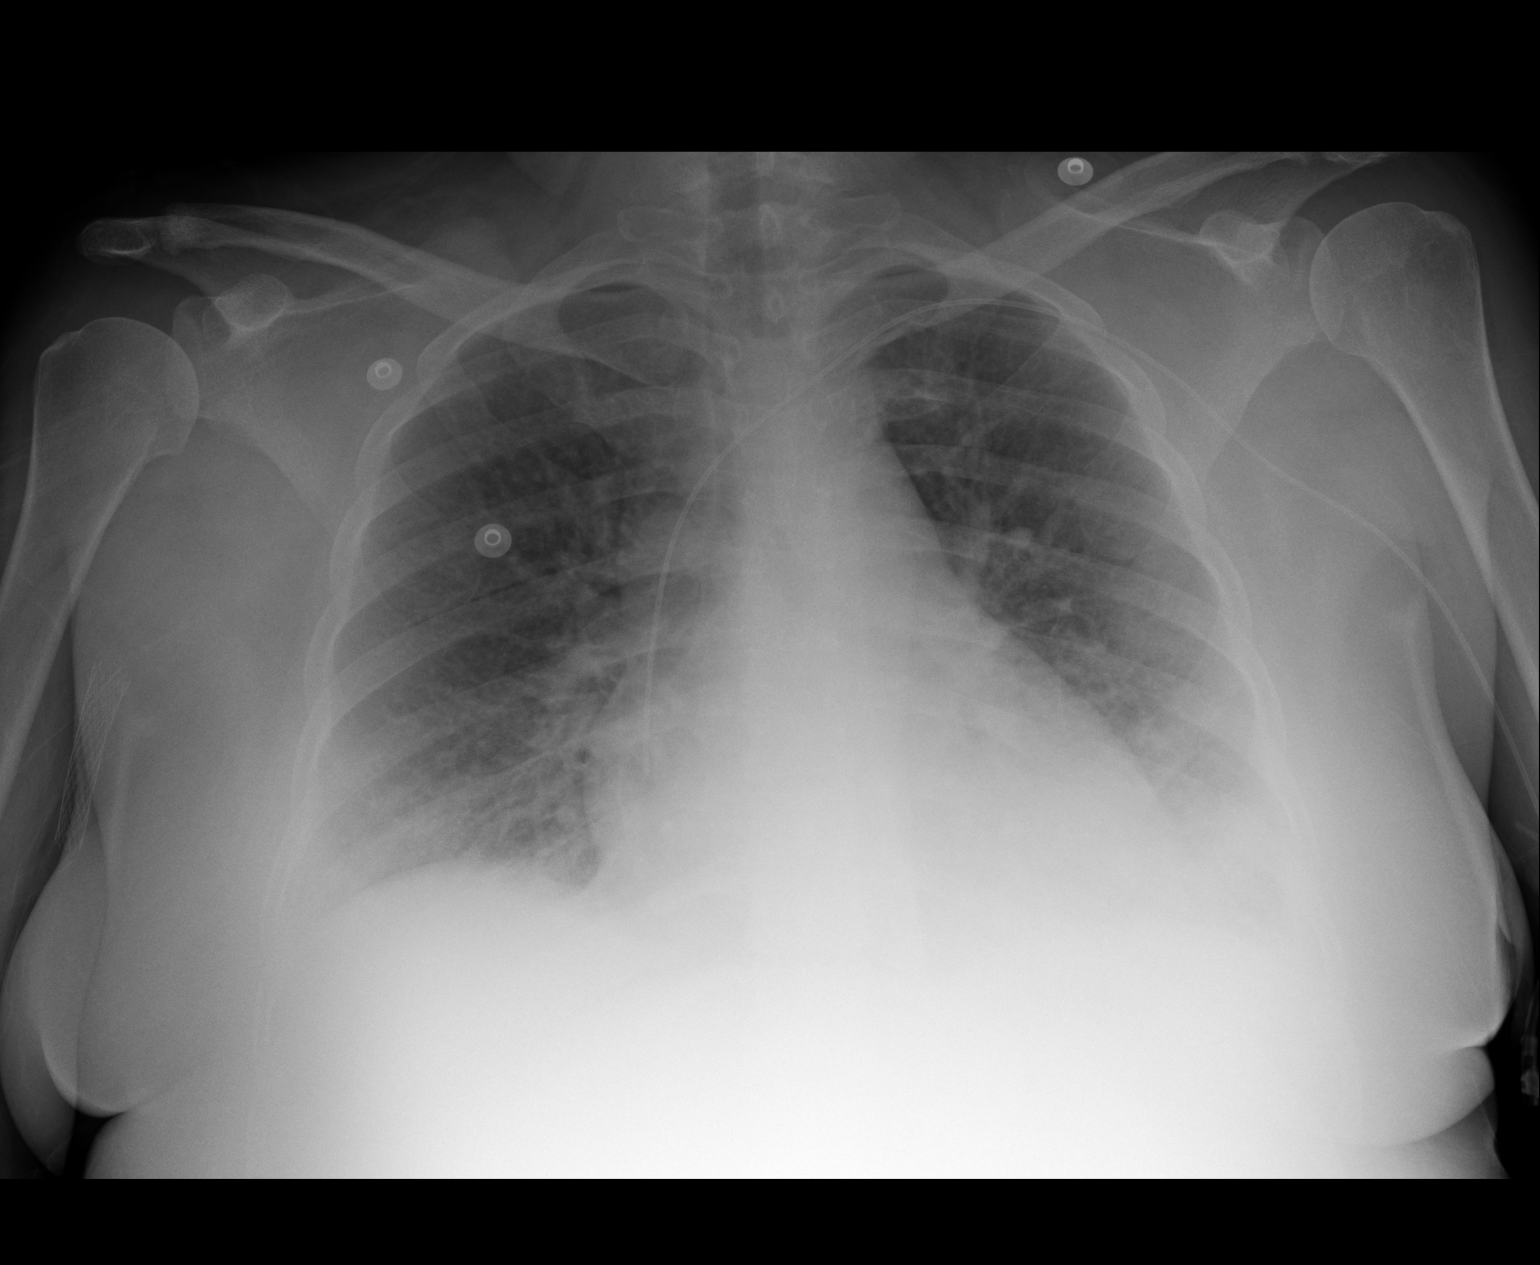

[2 of 2 positions shown; findings below may reference images not displayed]

FINDINGS: PA and lateral chest radiographs are provided. There is a left-sided PICC
line with the tip in satisfactory position. There is bilateral diffuse
interstitial thickening likely representing interstitial edema versus
interstitial pneumonitis secondary to an infectious or inflammatory
etiology. There is no focal parenchymal opacity, pleural effusion, or
pneumothorax. The heart and mediastinum are unremarkable.  The osseous
structures are unremarkable.
IMPRESSION: There is bilateral diffuse interstitial thickening likely representing
interstitial edema versus interstitial pneumonitis secondary to an
infectious or inflammatory etiology.

[REDACTED]

## 2012-07-02 IMAGING — CR DG ABDOMEN 2V
1 series · 3 of 3 positions shown · non-contrast
Comparison: none

REASON FOR EXAM: pain  vomiting
COMMENTS:

PROCEDURE:     DXR - DXR ABDOMEN 2 V FLAT AND ERECT  - February 25, 2012  [DATE]
RESULT:     Comparisons:  None

[Series 2: x abdomen erect · 0.14mm/px · 3 of 3 slices shown]
[im 1/3]
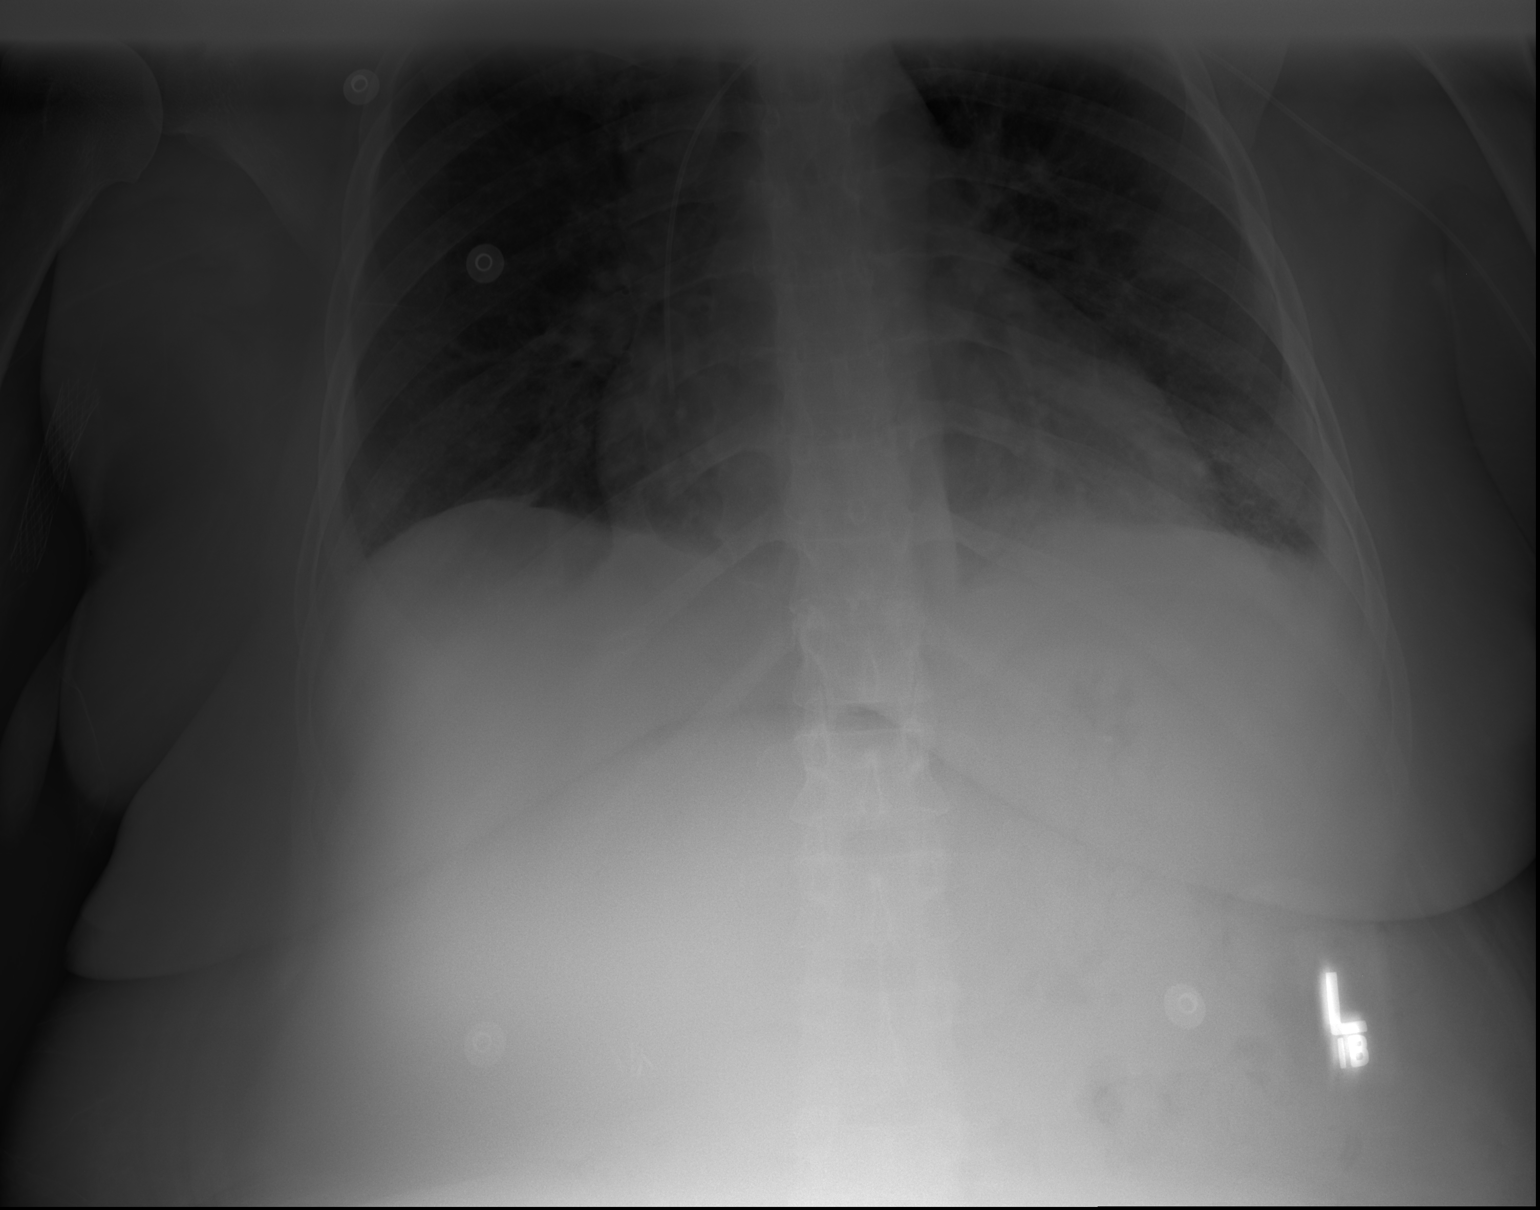
[im 2/3]
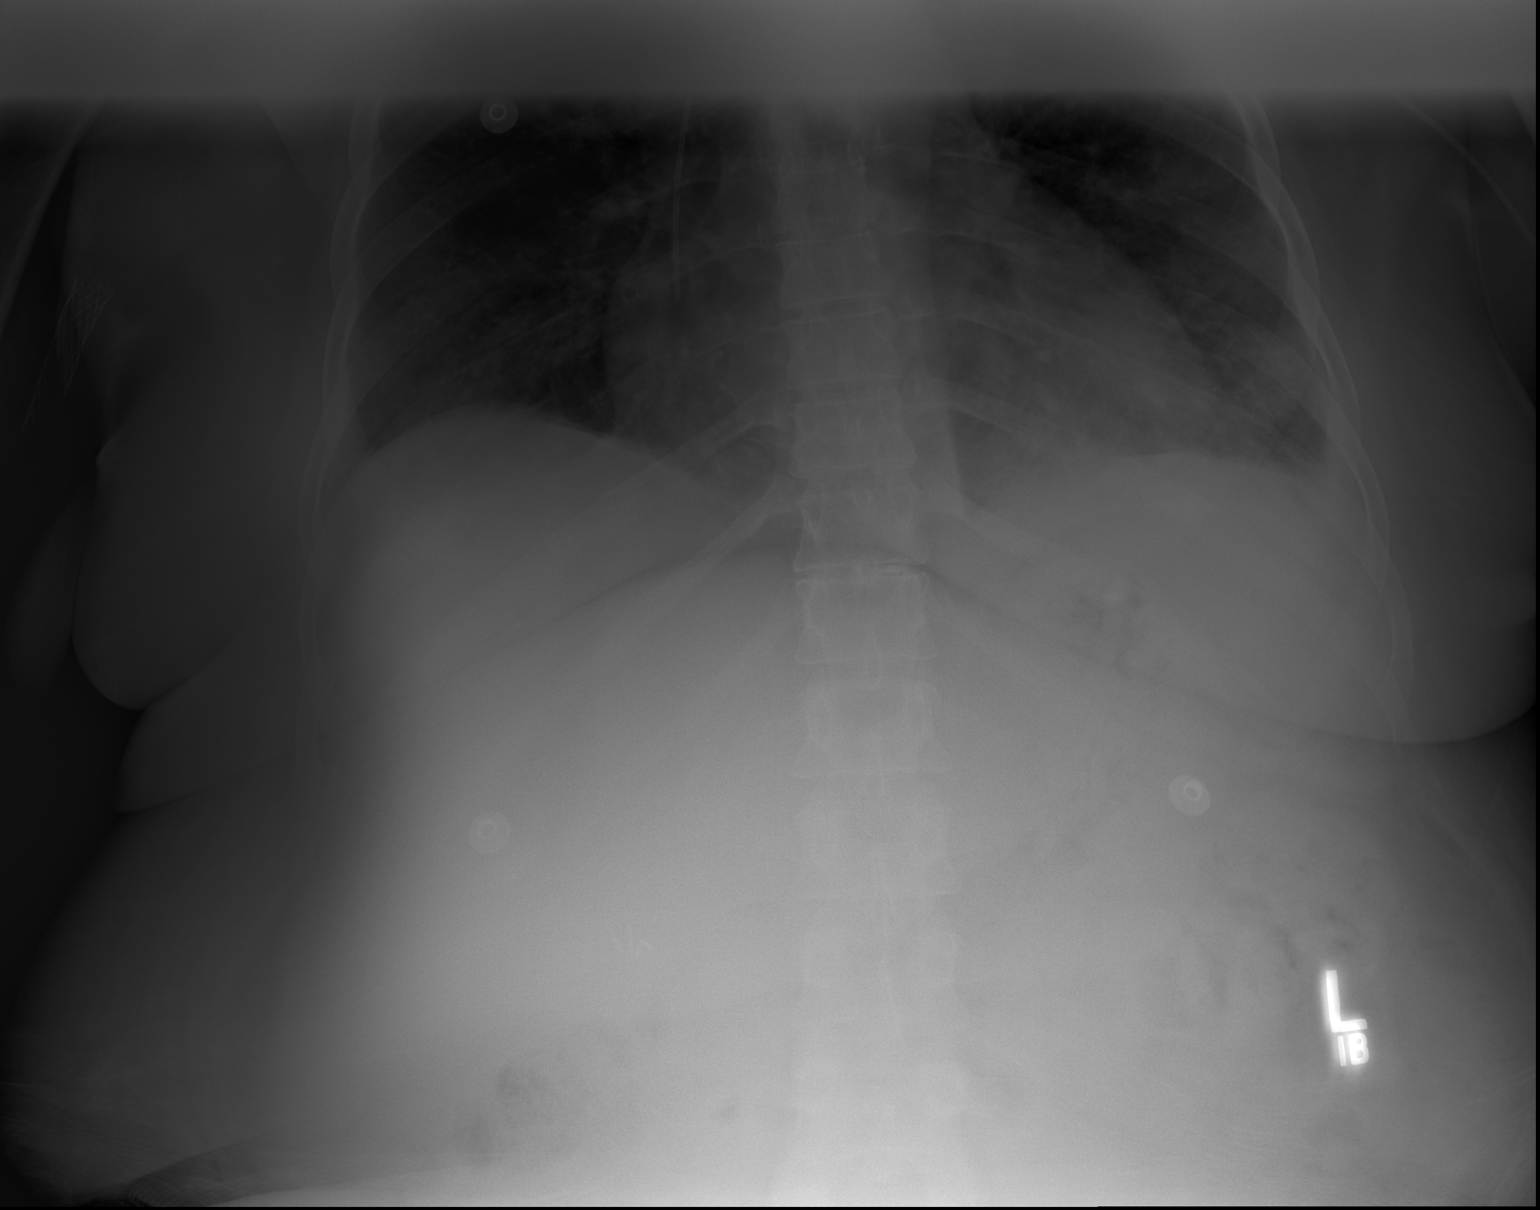
[im 3/3]
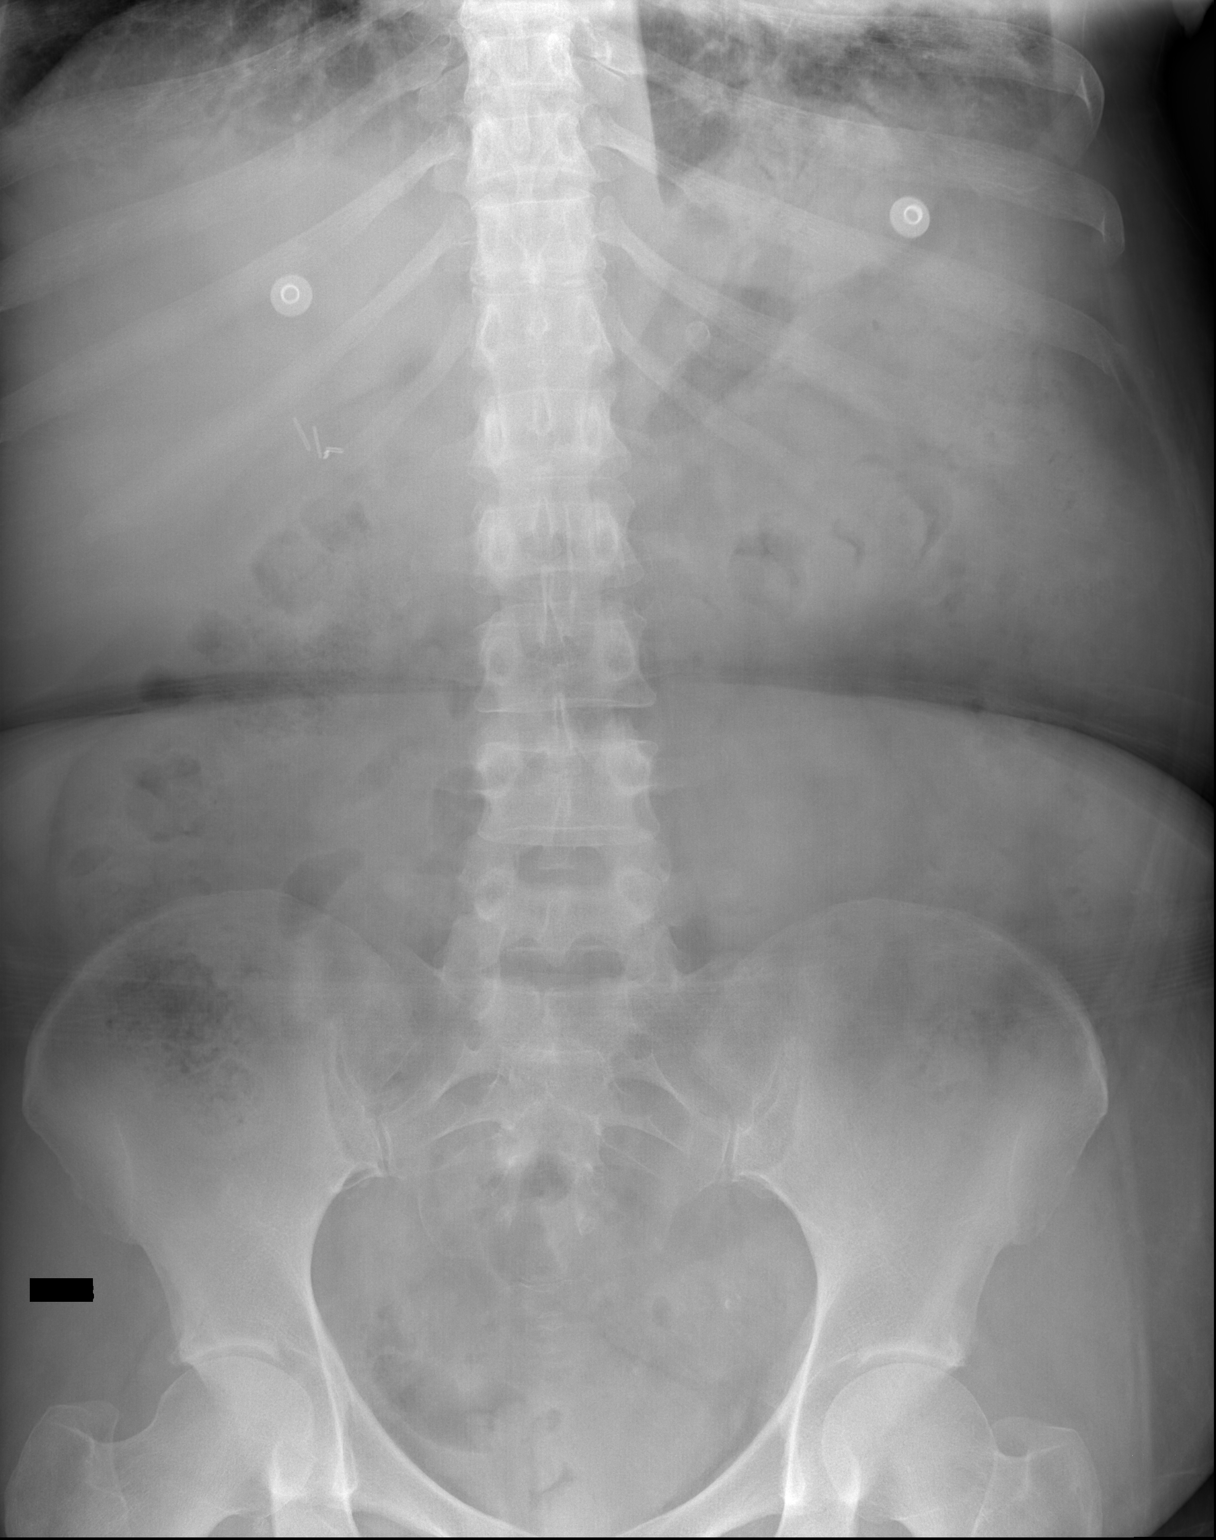

[3 of 3 positions shown; findings below may reference images not displayed]

FINDINGS: Supine and upright views of the abdomen are provided.

There is a nonspecific bowel gas pattern. There is no bowel dilatation to
suggest obstruction. There are no air-fluid levels. There is no pathologic
calcification along the expected course of the ureters. There is no evidence
of pneumoperitoneum, portal venous gas, or pneumatosis.

The osseous structures are unremarkable.
IMPRESSION: Unremarkable abdominal radiograph.

[REDACTED]

## 2012-07-02 IMAGING — CT CT CHEST W/O CM
1 series · 15 of 32 positions shown, 19 images · non-contrast
Comparison: none

REASON FOR EXAM: hemoptysis
COMMENTS:

[Series 2: soft tissue · axial · 0.69mm/px · z∈[-111,+156]mm · 15 of 100 slices shown, 19 images]
[im 7/100  soft-tissue]
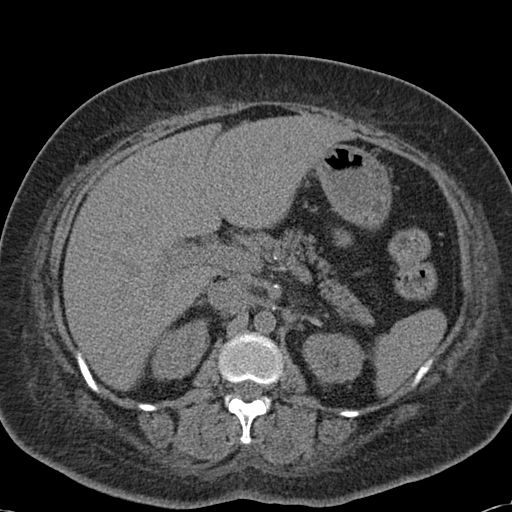
[im 7/100  bone]
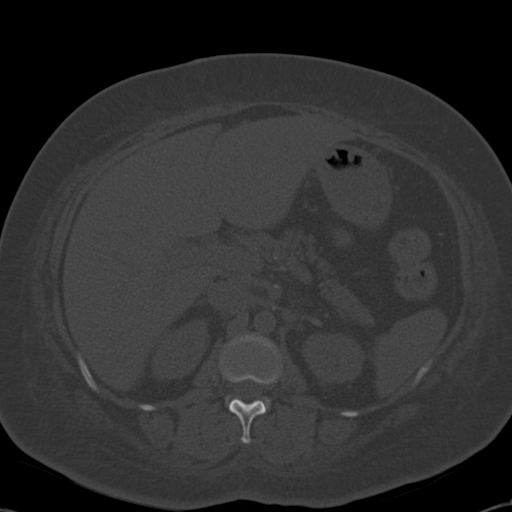
[im 13/100  soft-tissue]
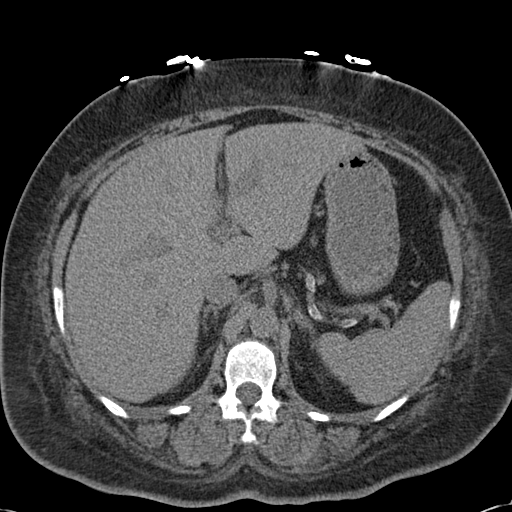
[im 20/100  soft-tissue]
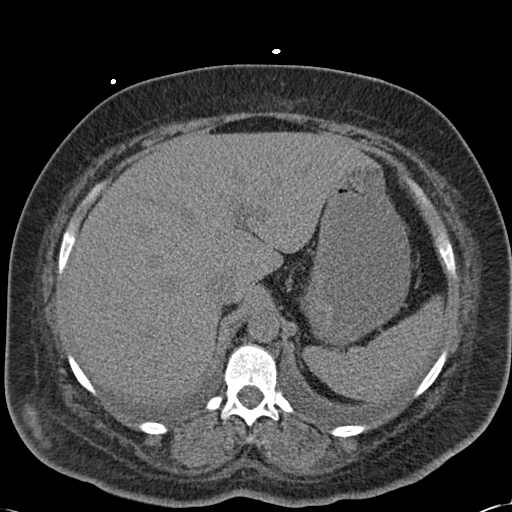
[im 29/100  soft-tissue]
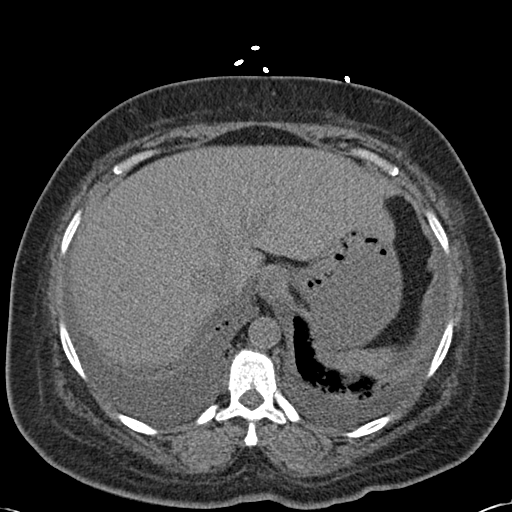
[im 36/100  soft-tissue]
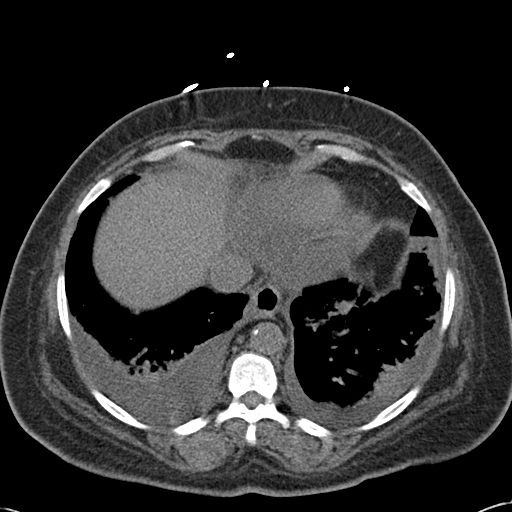
[im 42/100  soft-tissue]
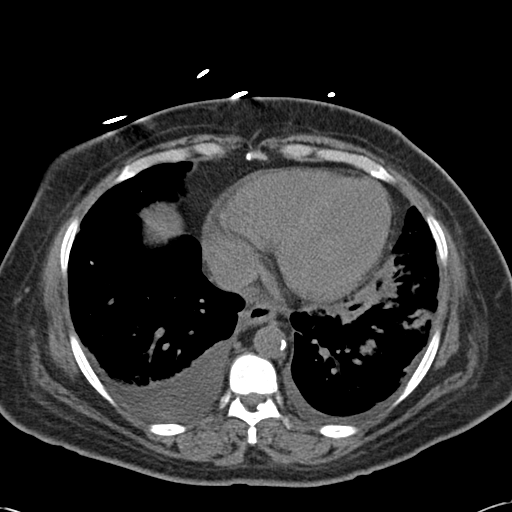
[im 52/100  soft-tissue]
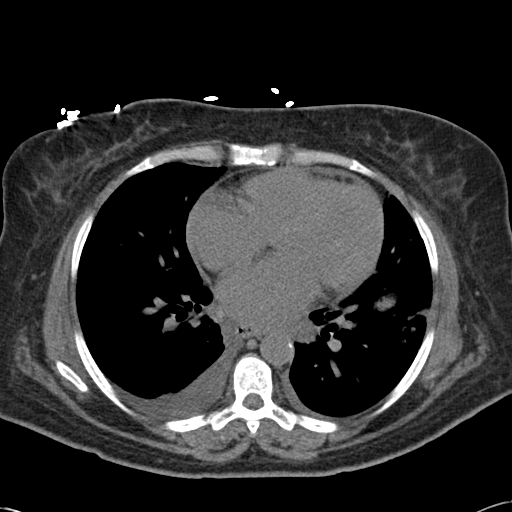
[im 58/100  soft-tissue]
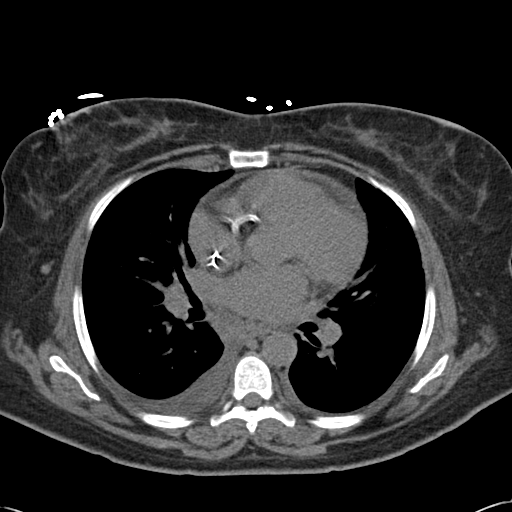
[im 64/100  soft-tissue]
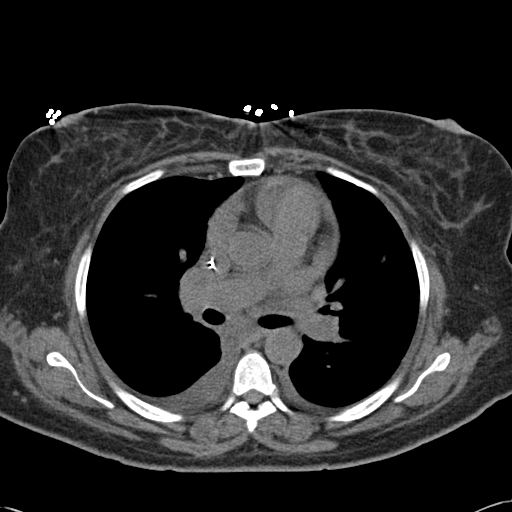
[im 64/100  bone]
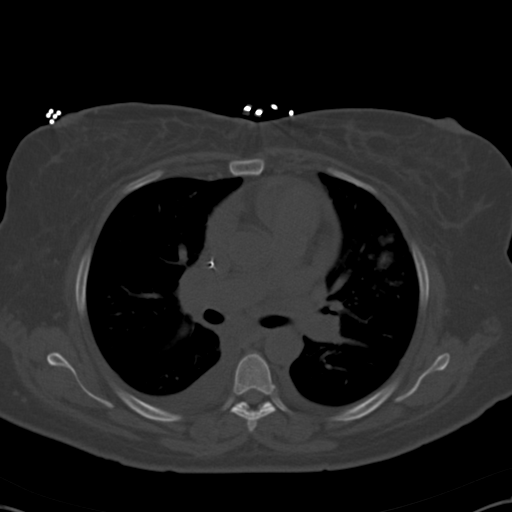
[im 71/100  soft-tissue]
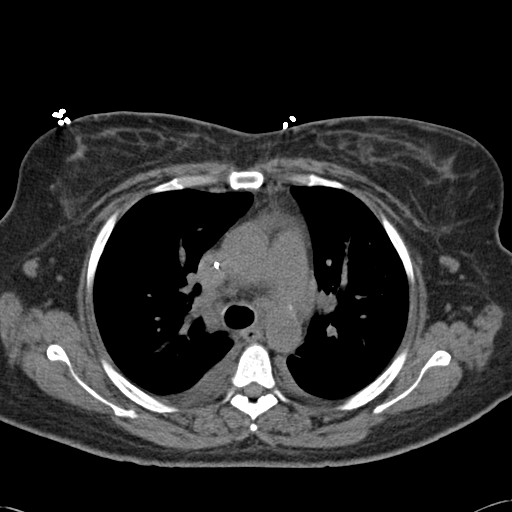
[im 80/100  soft-tissue]
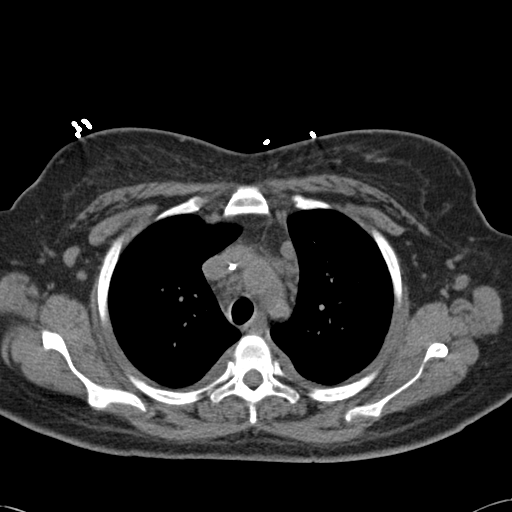
[im 87/100  soft-tissue]
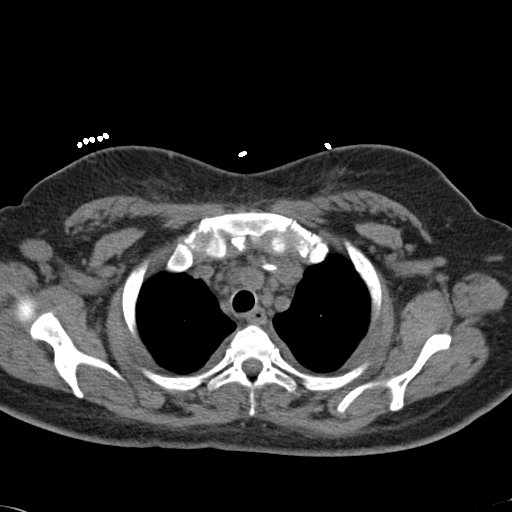
[im 87/100  lung]
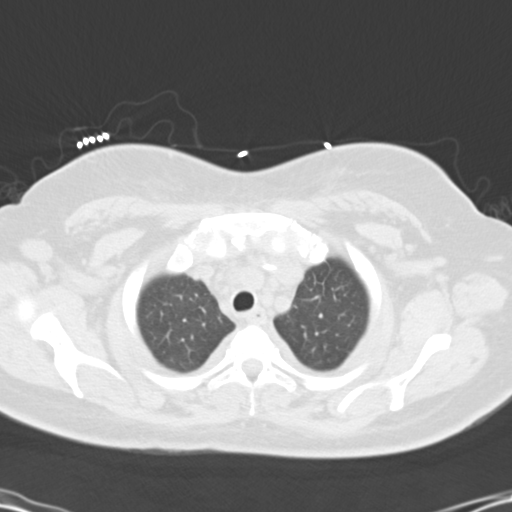
[im 90/100  lung]
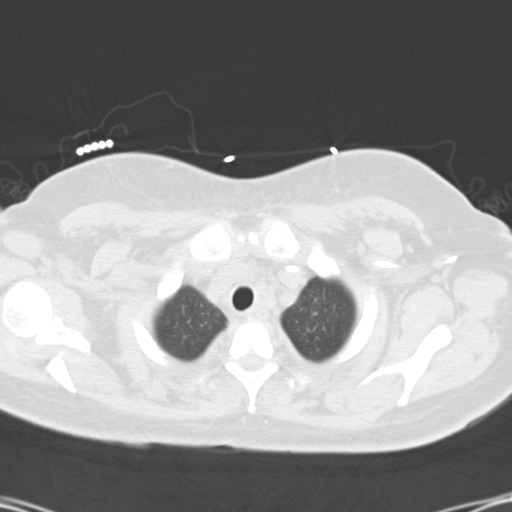
[im 93/100  soft-tissue]
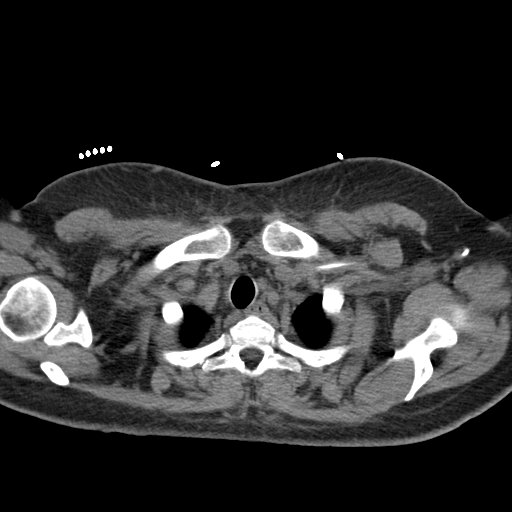
[im 93/100  lung]
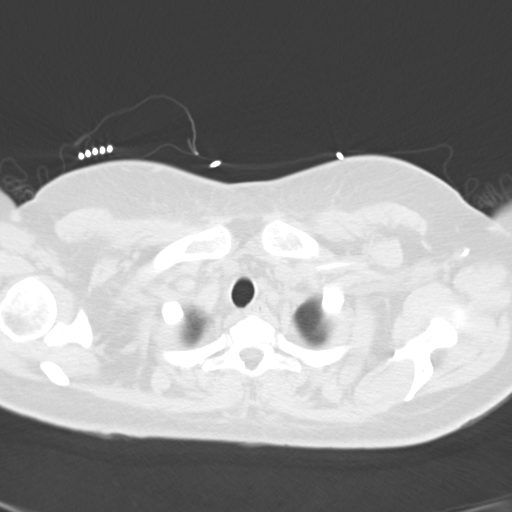
[im 96/100  lung]
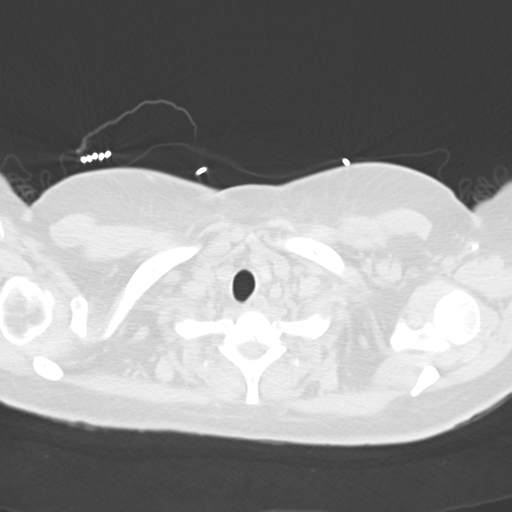

[15 of 32 positions shown; findings below may reference images not displayed]

PROCEDURE:     CT  - CT CHEST WITHOUT CONTRAST  - February 25, 2012  [DATE]

RESULT:     CT of the chest without contrast is reconstructed at 3.0 mm
slice thickness in the axial plane. The patient has no previous CT of the
chest for comparison. There is groundglass patchy density in the left lower
lobe and the lingular portion and midportion of the left upper lobe. There
is small right pleural effusion with some compressive atelectasis. Trace
left pleural effusion is present. The heart is mildly enlarged. There is a
left-sided central venous catheter present with the tip at the junction of
the superior vena cava with the right atrium. Minimal pericardial fluid is
present. No pneumothorax is evident. Borderline enlarged para-aortic and
mildly enlarged para-aortic lymph nodes are present. No definite subcarinal
adenopathy is evident the area is poorly seen because of the lack of
contrast. There may be a paratracheal left esophageal lymph node anterior to
the esophagus. Some pretracheal adenopathy is present. Slightly prominent
bilateral axillary lymph nodes are present but nonspecific. The included
upper abdominal structures appear grossly unremarkable. No endobronchial or
tracheal mass is evident.
IMPRESSION: 1. Findings concerning for left lower lobe and left upper lobe pneumonia.
Bilateral pleural effusions larger on the right with some compressive
atelectasis in the lower lobes. Mediastinal adenopathy. It is uncertain if
this is reactive or not. Some prominent axillary lymph nodes are also noted.

[REDACTED]

## 2012-07-05 IMAGING — CR DG CHEST 2V
1 series · 3 of 3 positions shown · non-contrast
Comparison: none

REASON FOR EXAM: follow up pnEUMONI
COMMENTS:

PROCEDURE:     DXR - DXR CHEST PA (OR AP) AND LATERAL  - February 28, 2012  [DATE]
RESULT:     Comparison: 02/25/2012

[Series 4: w chest ap · 0.14mm/px · 3 of 3 slices shown]
[im 1/3]
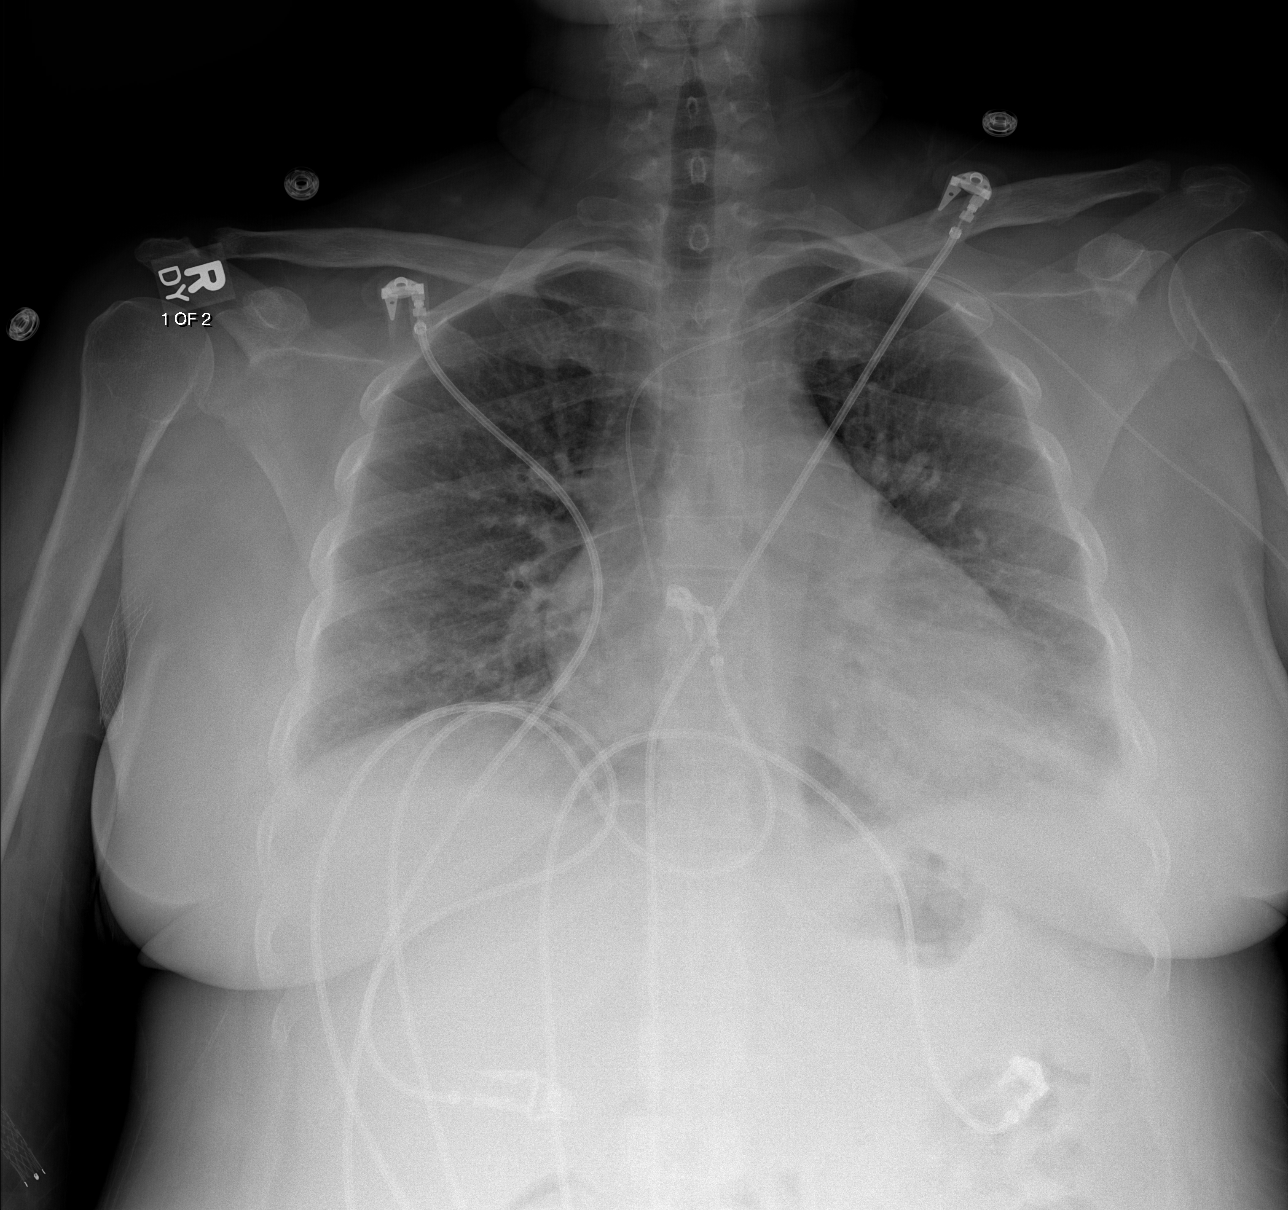
[im 2/3]
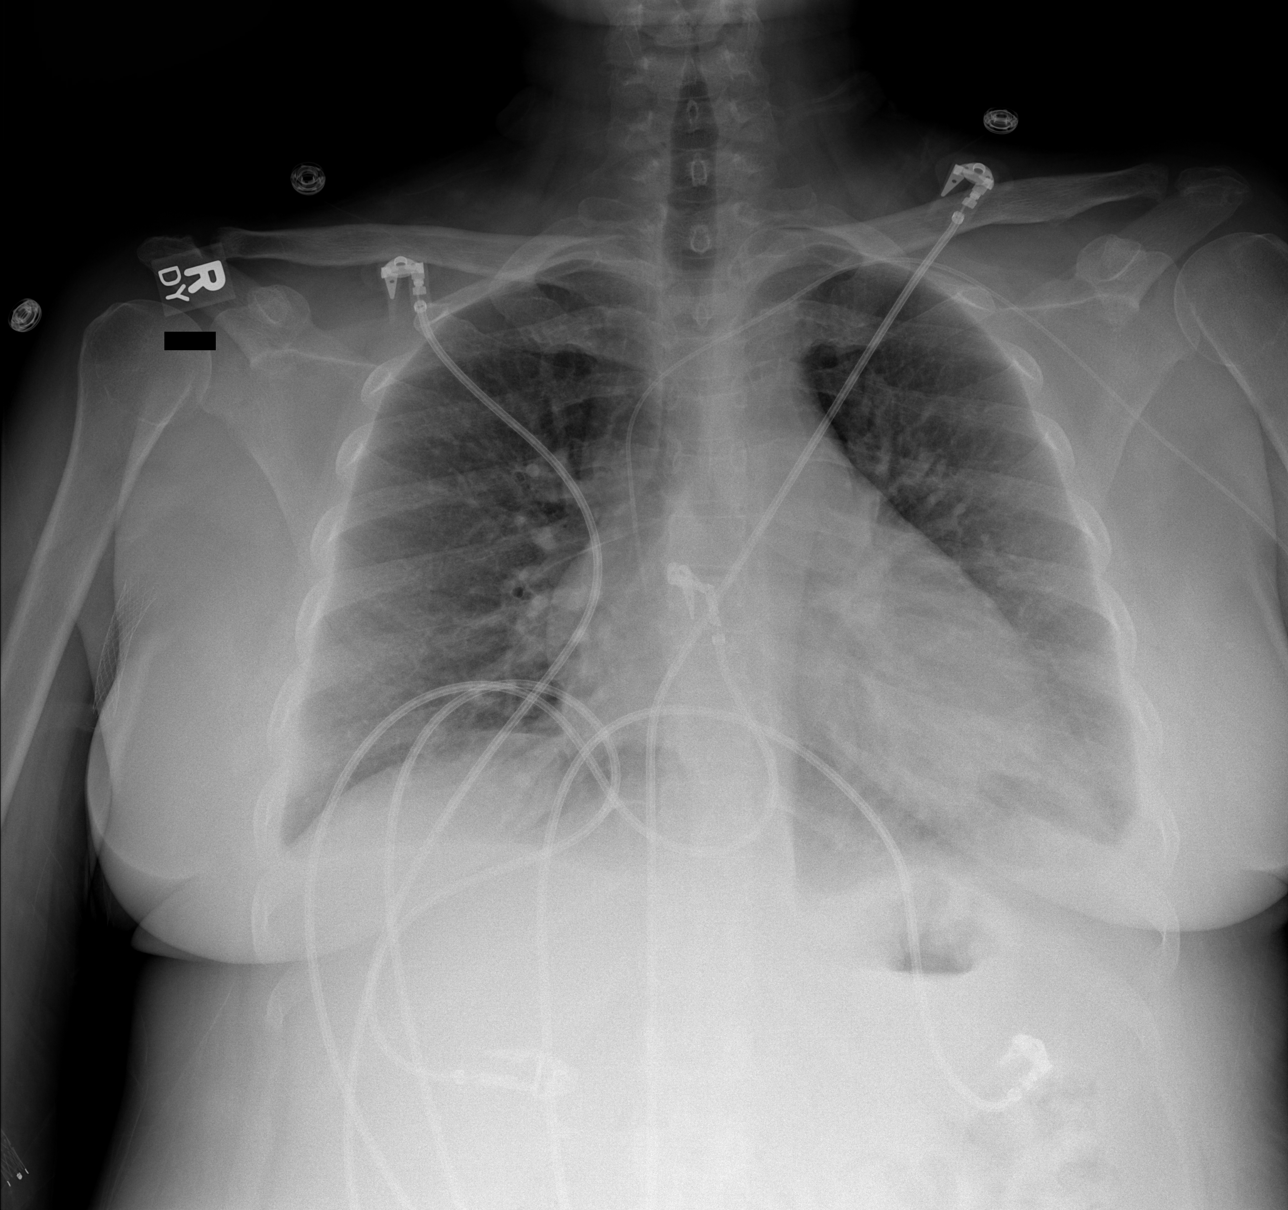
[im 3/3]
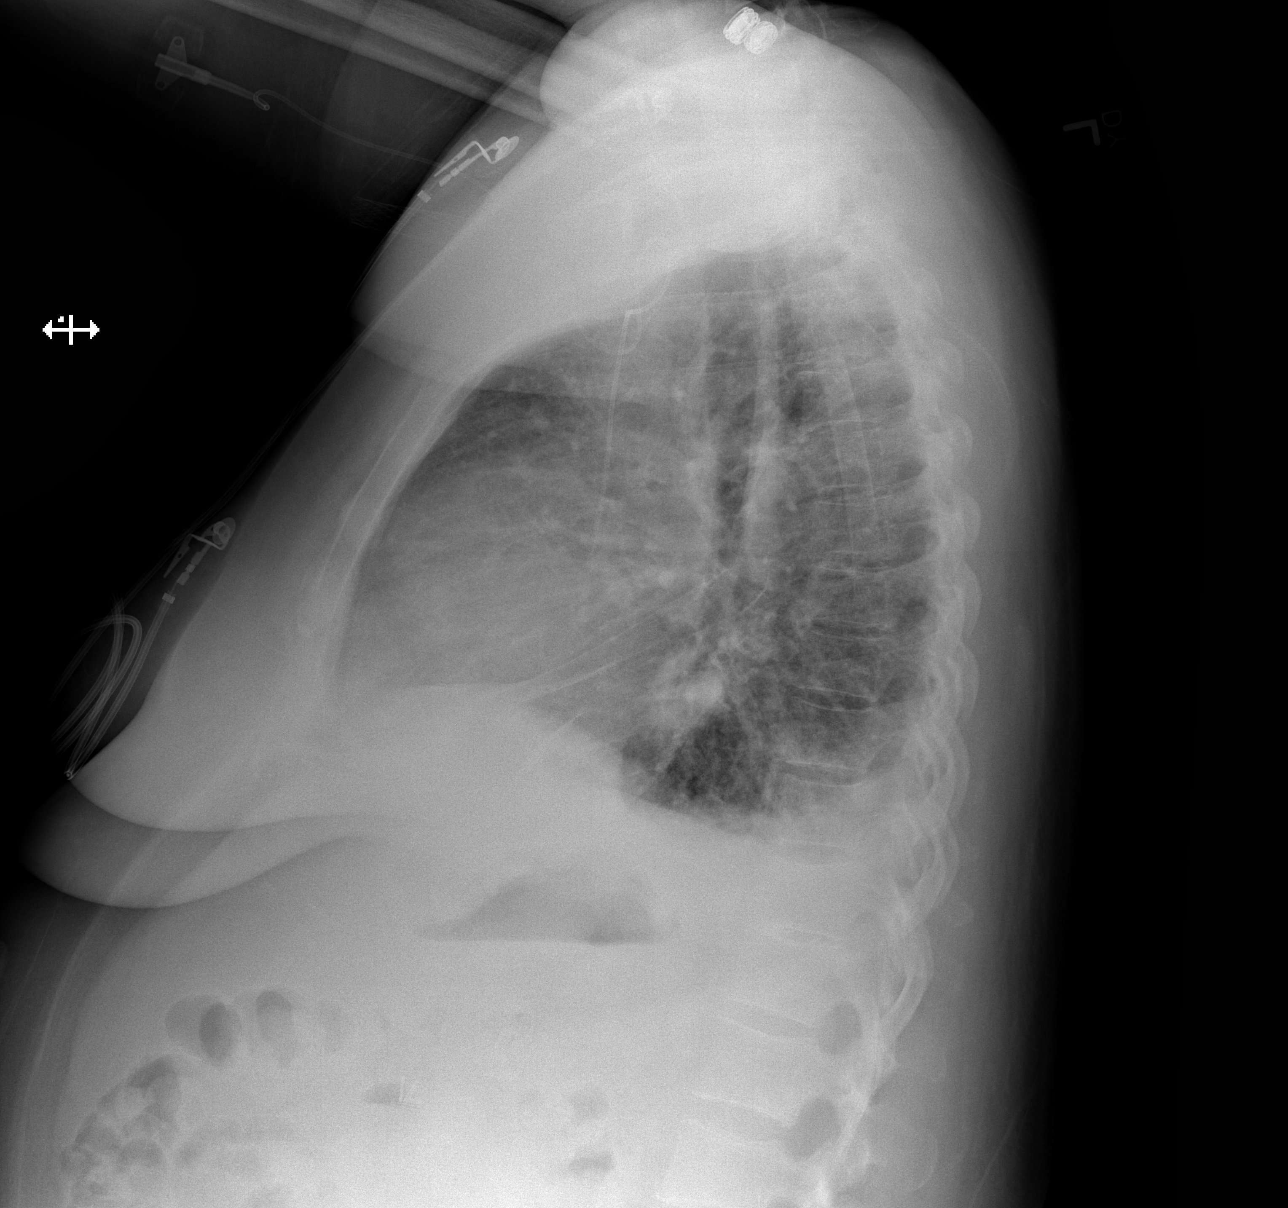

[3 of 3 positions shown; findings below may reference images not displayed]

FINDINGS: PA and lateral chest radiographs are provided. There is mild bilateral
interstitial prominence which has significantly improved compared with the
prior exam. There is no focal parenchymal opacity, pleural effusion, or
pneumothorax. The heart size is enlarged. There is a left-sided PICC line in
satisfactory position..  The osseous structures are unremarkable.
IMPRESSION: Please see above.

[REDACTED]

## 2012-07-12 IMAGING — CR DG ABDOMEN 1V
1 series · 2 of 2 positions shown · non-contrast
Comparison: none

REASON FOR EXAM: abdominal pain, vomiting, hx of gastroparesis, 3 way
x-ray earlier today, assess
COMMENTS:

PROCEDURE:     DXR - DXR KIDNEY URETER BLADDER  - March 06, 2012  [DATE]
RESULT:     Supine views of the abdomen and pelvis reveal a paucity of bowel
gas. There is no evidence of ileus nor obstruction. No abnormal
calcifications are demonstrated.

[Series 7: x abdomen supine · 0.14mm/px · 2 of 2 slices shown]
[im 1/2]
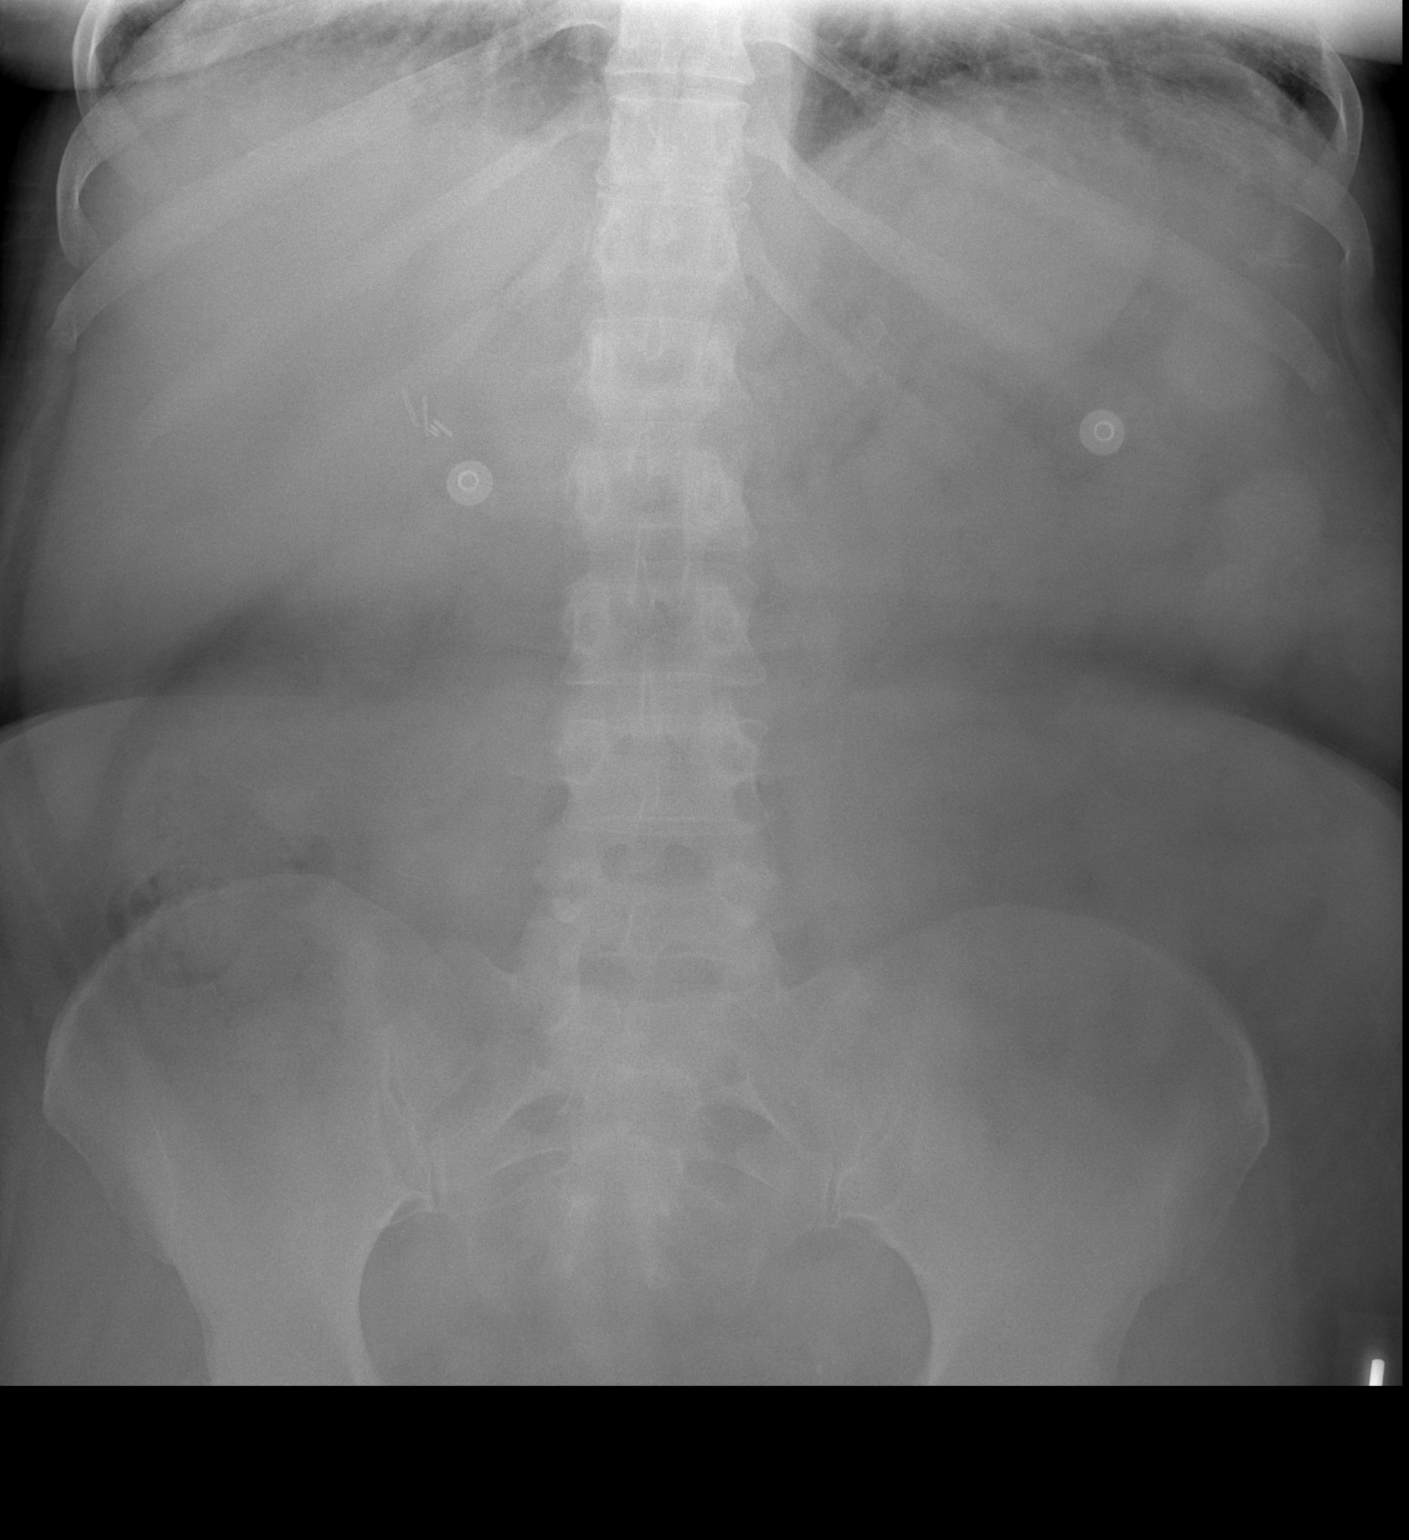
[im 2/2]
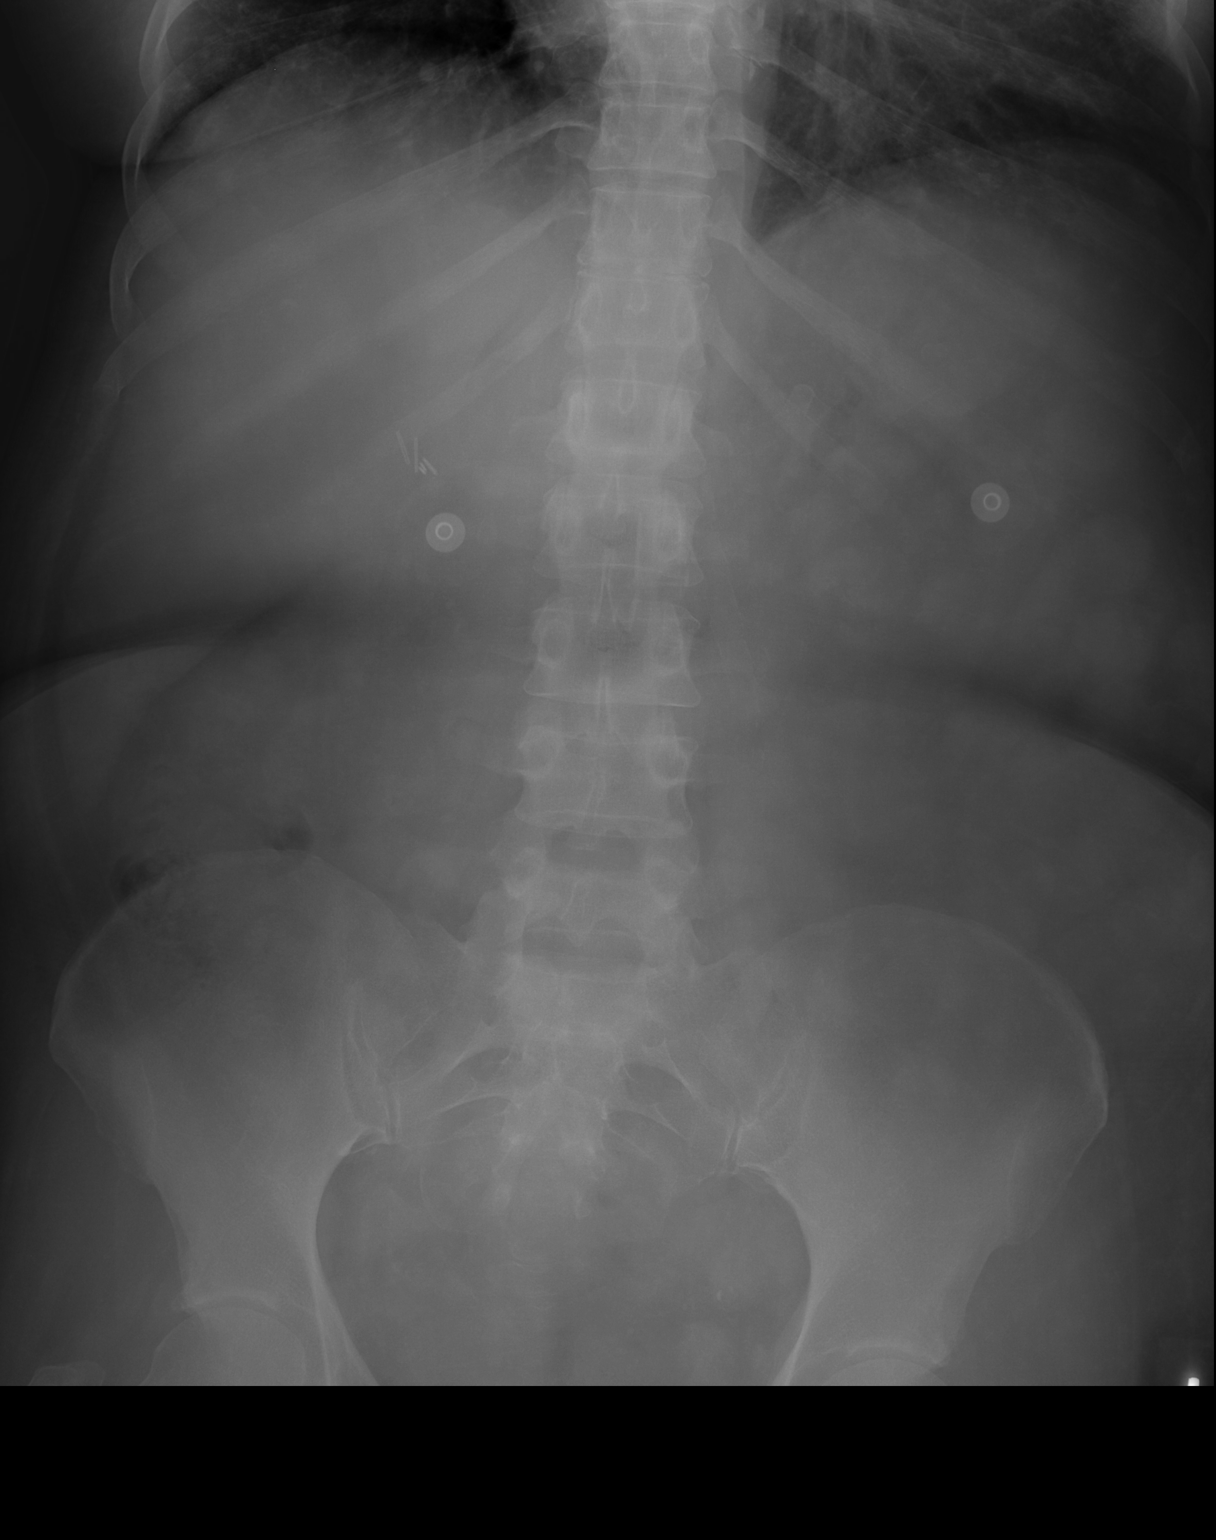

[2 of 2 positions shown; findings below may reference images not displayed]

IMPRESSION: There is no abnormality of the bowel gas pattern. No
definite acute intra-abdominal abnormality is demonstrated.

[REDACTED]

## 2012-07-12 IMAGING — CR DG ABDOMEN 3V
1 series · 4 of 4 positions shown · non-contrast
Comparison: none

REASON FOR EXAM: abd pain
COMMENTS:

[Series 1: pa · 0.17mm/px · 4 of 4 slices shown]
[im 1/4]
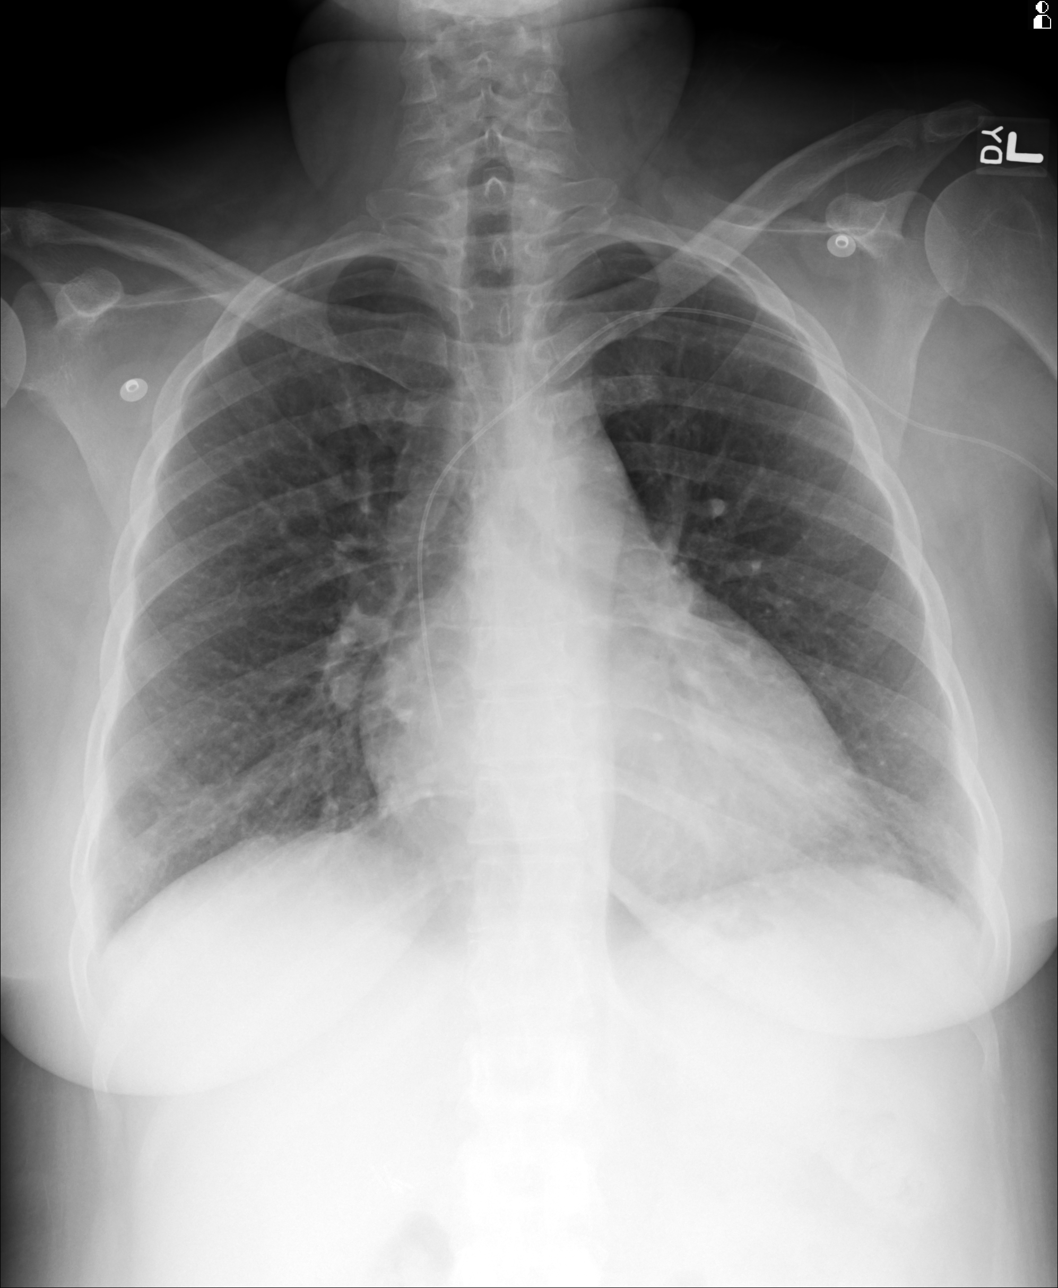
[im 2/4]
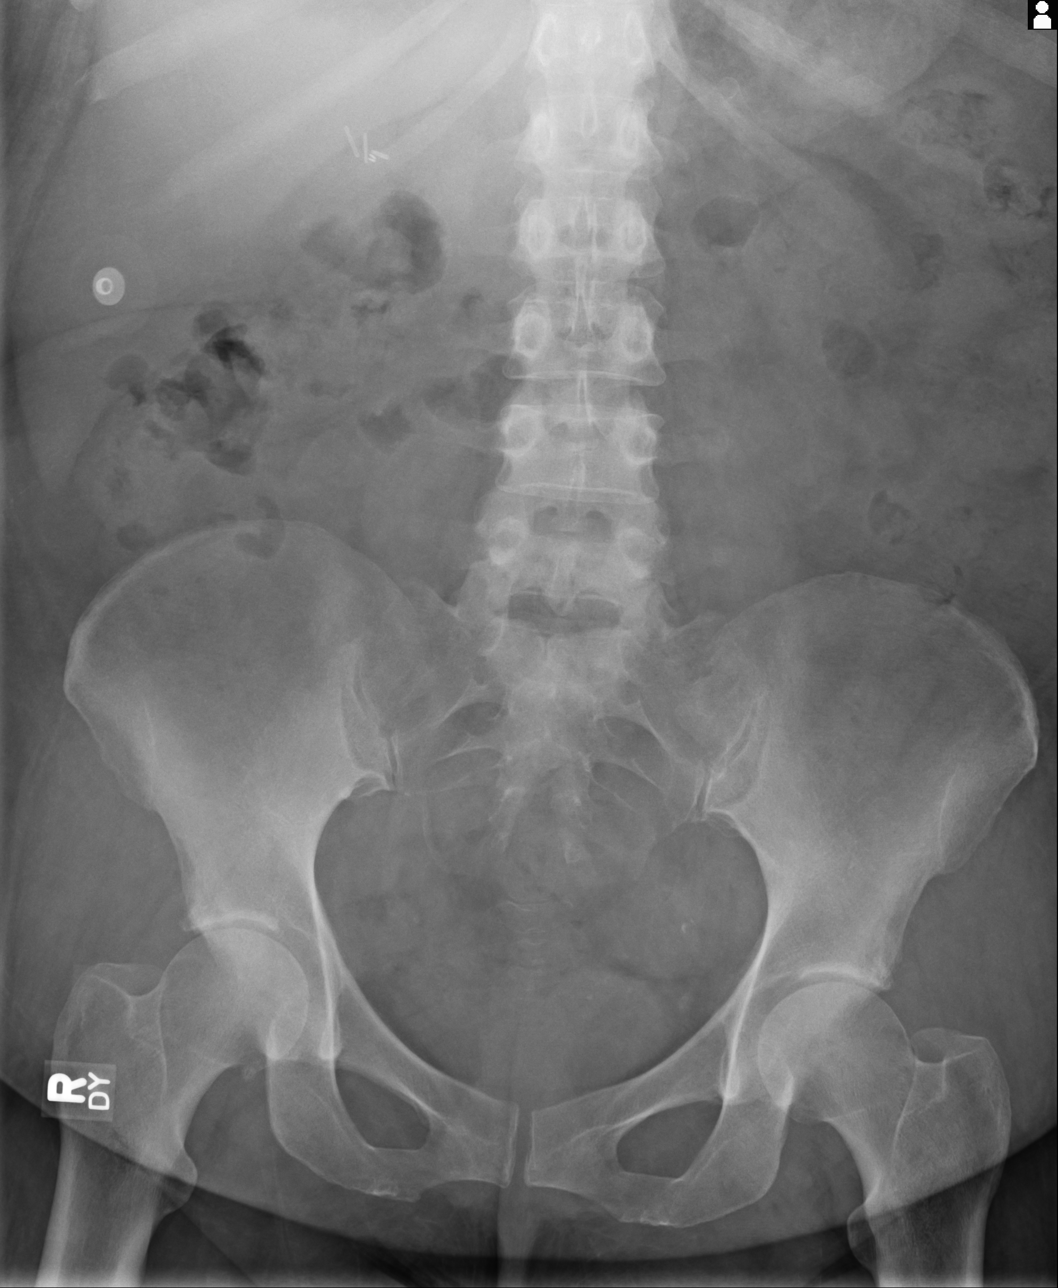
[im 3/4]
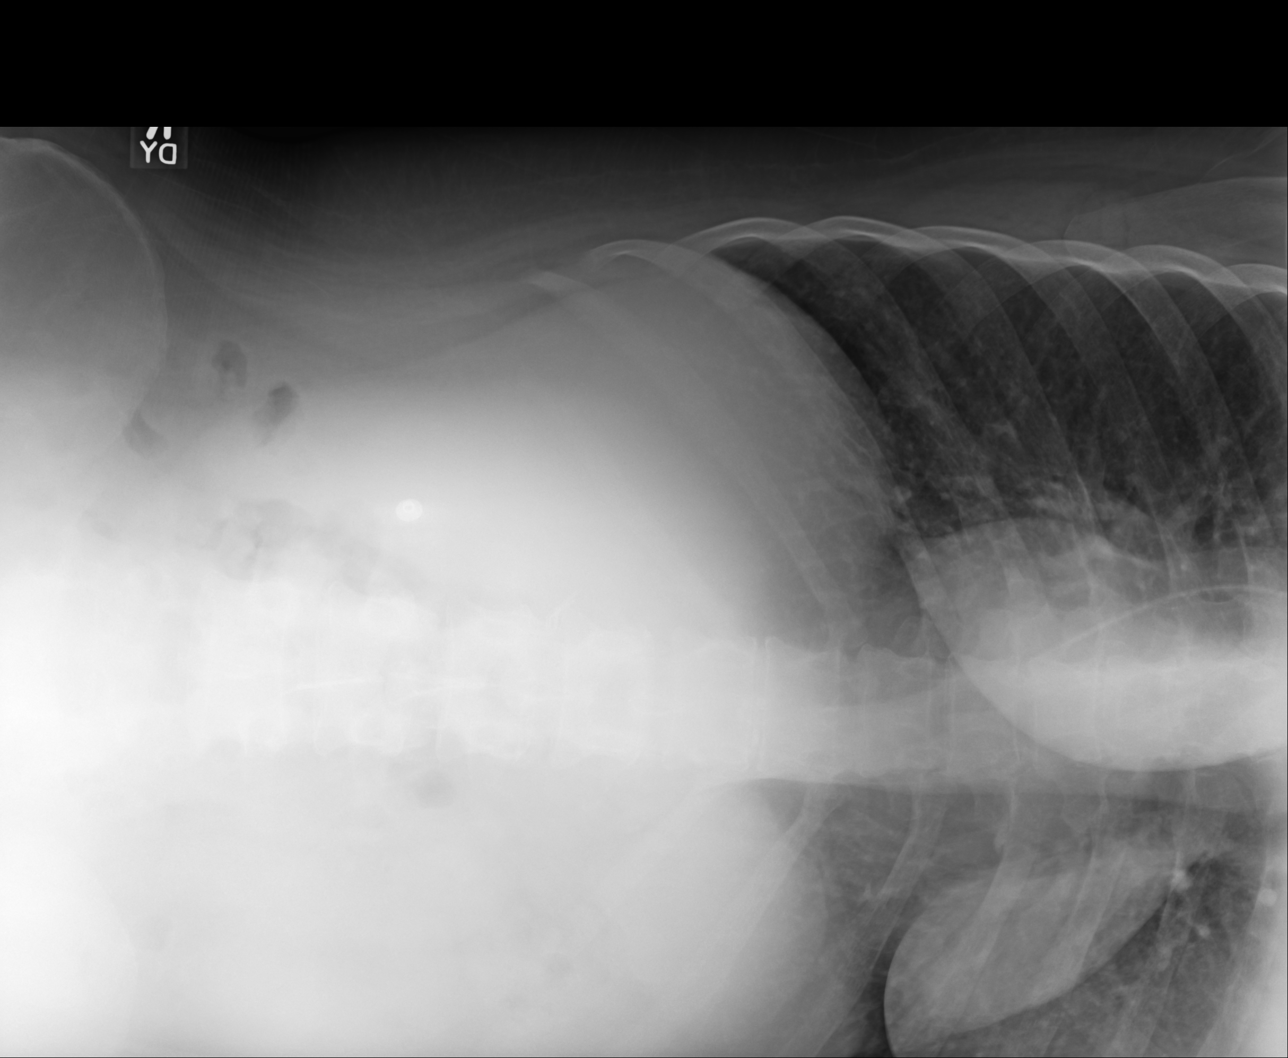
[im 4/4]
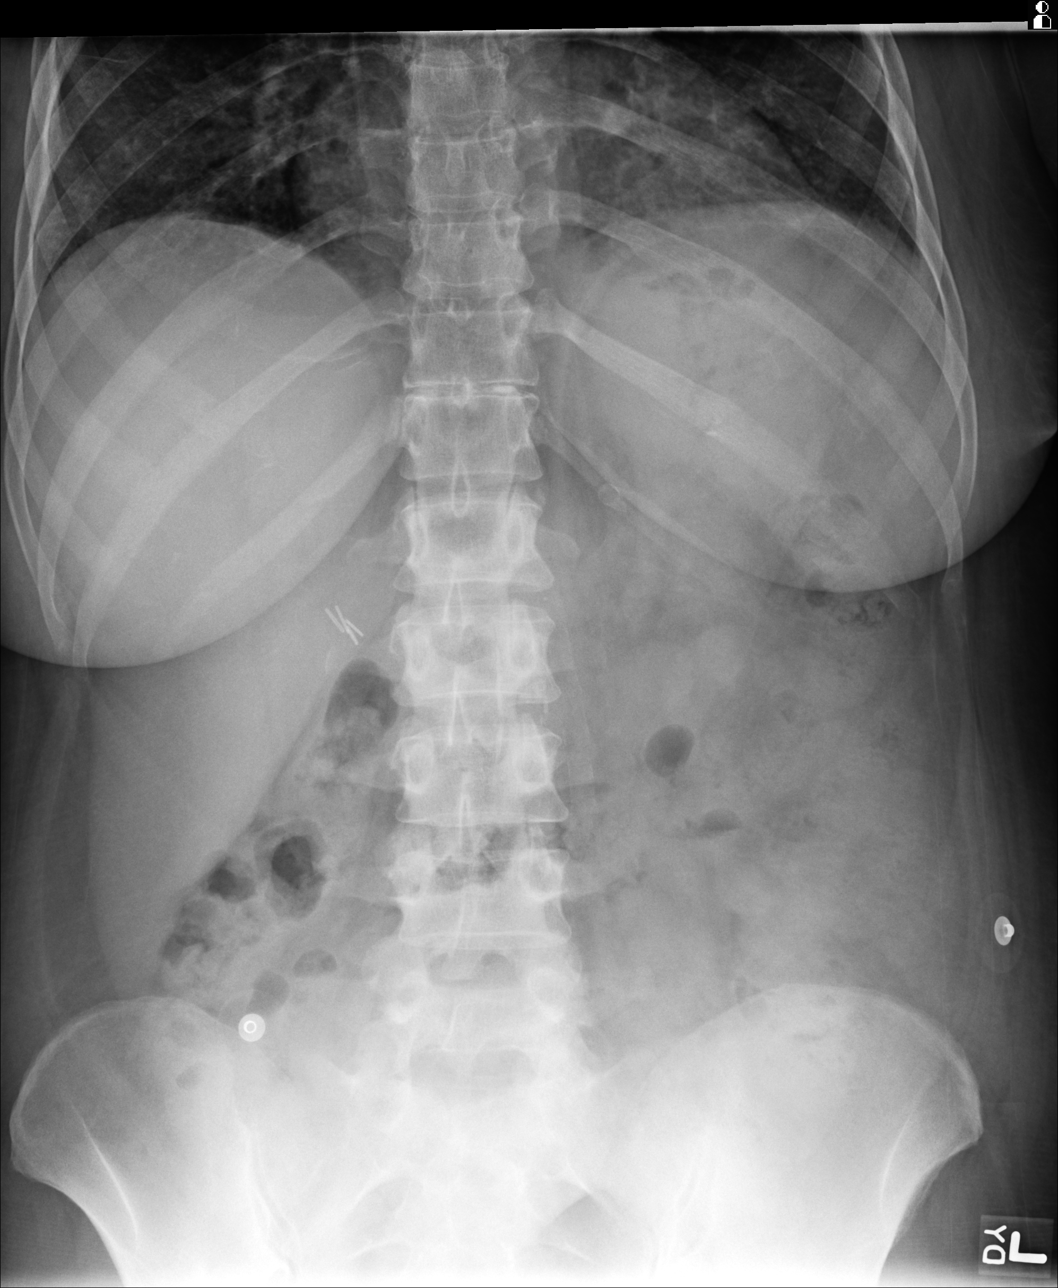

[4 of 4 positions shown; findings below may reference images not displayed]

PROCEDURE:     DXR - DXR ABDOMEN 3-WAY (INCL PA CXR)  - March 06, 2012  [DATE]

RESULT:     Comparison is made to the study 25 February, 2012.

The chest film reveals the lungs to be adequately inflated. Interstitial
markings are mildly prominent. The cardiac silhouette is enlarged. There is
a left subclavian venous catheter in place with the tip in the region of the
distal SVC. The mediastinum is normal in width. Within the abdomen the bowel
gas pattern is nonspecific. There is no evidence of ileus or obstruction or
perforation. There are surgical clips in the gallbladder fossa. A moderate
amount of stool is present within the colon. The bony structures exhibit no
acute abnormality.
IMPRESSION: 1. I do not see evidence of bowel obstruction or ileus. There may be an
element of constipation present. The bowel gas pattern is not greatly
changed from that 25 February, 2012.
2. Mildly increased interstitial markings are present in both lungs. These
findings are not entirely new.

[REDACTED]

## 2012-08-20 ENCOUNTER — Ambulatory Visit: Payer: Self-pay | Admitting: Gastroenterology

## 2012-11-02 ENCOUNTER — Emergency Department: Payer: Self-pay | Admitting: Emergency Medicine

## 2012-11-02 LAB — CBC WITH DIFFERENTIAL/PLATELET
Basophil #: 0 10*3/uL (ref 0.0–0.1)
Basophil %: 0.4 %
Eosinophil #: 0.1 10*3/uL (ref 0.0–0.7)
Eosinophil %: 1.3 %
HCT: 30.1 % — ABNORMAL LOW (ref 35.0–47.0)
HGB: 9.8 g/dL — ABNORMAL LOW (ref 12.0–16.0)
Lymphocyte #: 1.6 10*3/uL (ref 1.0–3.6)
Lymphocyte %: 18.7 %
MCH: 30.4 pg (ref 26.0–34.0)
MCHC: 32.5 g/dL (ref 32.0–36.0)
MCV: 94 fL (ref 80–100)
Monocyte #: 0.5 x10 3/mm (ref 0.2–0.9)
Monocyte %: 6.2 %
Neutrophil #: 6.1 10*3/uL (ref 1.4–6.5)
Neutrophil %: 73.4 %
Platelet: 161 10*3/uL (ref 150–440)
RBC: 3.22 10*6/uL — ABNORMAL LOW (ref 3.80–5.20)
RDW: 13.9 % (ref 11.5–14.5)
WBC: 8.4 10*3/uL (ref 3.6–11.0)

## 2012-11-02 LAB — URINALYSIS, COMPLETE
Bilirubin,UR: NEGATIVE
Blood: NEGATIVE
Glucose,UR: 100 mg/dL (ref 0–75)
Ketone: NEGATIVE
Leukocyte Esterase: NEGATIVE
Nitrite: NEGATIVE
Ph: 9 (ref 4.5–8.0)
Protein: 75
RBC,UR: 6 /HPF (ref 0–5)
Specific Gravity: 1.005 (ref 1.003–1.030)
Squamous Epithelial: <1
Transitional Epi: <1
WBC UR: 2 /HPF (ref 0–5)

## 2012-11-02 LAB — COMPREHENSIVE METABOLIC PANEL
Albumin: 3.8 g/dL (ref 3.4–5.0)
Alkaline Phosphatase: 89 U/L (ref 50–136)
Anion Gap: 9 (ref 7–16)
BUN: 18 mg/dL (ref 7–18)
Bilirubin,Total: 0.4 mg/dL (ref 0.2–1.0)
Calcium, Total: 9.1 mg/dL (ref 8.5–10.1)
Chloride: 96 mmol/L — ABNORMAL LOW (ref 98–107)
Co2: 29 mmol/L (ref 21–32)
Creatinine: 5.07 mg/dL — ABNORMAL HIGH (ref 0.60–1.30)
EGFR (African American): 10 — ABNORMAL LOW
EGFR (Non-African Amer.): 9 — ABNORMAL LOW
Glucose: 356 mg/dL — ABNORMAL HIGH (ref 65–99)
Osmolality: 284 (ref 275–301)
Potassium: 3.6 mmol/L (ref 3.5–5.1)
SGOT(AST): 18 U/L (ref 15–37)
SGPT (ALT): 15 U/L (ref 12–78)
Sodium: 134 mmol/L — ABNORMAL LOW (ref 136–145)
Total Protein: 7.4 g/dL (ref 6.4–8.2)

## 2012-11-02 LAB — LIPASE, BLOOD: Lipase: 127 U/L (ref 73–393)

## 2012-11-03 ENCOUNTER — Inpatient Hospital Stay: Payer: Self-pay | Admitting: Internal Medicine

## 2012-11-03 LAB — CBC WITH DIFFERENTIAL/PLATELET
Basophil #: 0 10*3/uL (ref 0.0–0.1)
Basophil %: 0.5 %
Eosinophil #: 0.2 10*3/uL (ref 0.0–0.7)
Eosinophil %: 2.4 %
HCT: 31.2 % — ABNORMAL LOW (ref 35.0–47.0)
HGB: 10.2 g/dL — ABNORMAL LOW (ref 12.0–16.0)
Lymphocyte #: 1.8 10*3/uL (ref 1.0–3.6)
Lymphocyte %: 28 %
MCH: 30.6 pg (ref 26.0–34.0)
MCHC: 32.7 g/dL (ref 32.0–36.0)
MCV: 93 fL (ref 80–100)
Monocyte #: 0.3 x10 3/mm (ref 0.2–0.9)
Monocyte %: 5.2 %
Neutrophil #: 4 10*3/uL (ref 1.4–6.5)
Neutrophil %: 63.9 %
Platelet: 178 10*3/uL (ref 150–440)
RBC: 3.34 10*6/uL — ABNORMAL LOW (ref 3.80–5.20)
RDW: 14.1 % (ref 11.5–14.5)
WBC: 6.3 10*3/uL (ref 3.6–11.0)

## 2012-11-03 LAB — URINALYSIS, COMPLETE
Bacteria: NONE SEEN
Bilirubin,UR: NEGATIVE
Blood: NEGATIVE
Glucose,UR: >=500 mg/dL (ref 0–75)
Ketone: NEGATIVE
Leukocyte Esterase: NEGATIVE
Nitrite: NEGATIVE
Ph: 9 (ref 4.5–8.0)
Protein: >=500
RBC,UR: 3 /HPF (ref 0–5)
Specific Gravity: 1.007 (ref 1.003–1.030)
Squamous Epithelial: <1
WBC UR: 1 /HPF (ref 0–5)

## 2012-11-03 LAB — COMPREHENSIVE METABOLIC PANEL
Albumin: 4 g/dL (ref 3.4–5.0)
Alkaline Phosphatase: 97 U/L (ref 50–136)
Anion Gap: 6 — ABNORMAL LOW (ref 7–16)
BUN: 24 mg/dL — ABNORMAL HIGH (ref 7–18)
Bilirubin,Total: 0.4 mg/dL (ref 0.2–1.0)
Calcium, Total: 9.4 mg/dL (ref 8.5–10.1)
Chloride: 98 mmol/L (ref 98–107)
Co2: 33 mmol/L — ABNORMAL HIGH (ref 21–32)
Creatinine: 7.01 mg/dL — ABNORMAL HIGH (ref 0.60–1.30)
EGFR (African American): 7 — ABNORMAL LOW
EGFR (Non-African Amer.): 6 — ABNORMAL LOW
Glucose: 203 mg/dL — ABNORMAL HIGH (ref 65–99)
Osmolality: 284 (ref 275–301)
Potassium: 3.5 mmol/L (ref 3.5–5.1)
SGOT(AST): 14 U/L — ABNORMAL LOW (ref 15–37)
SGPT (ALT): 14 U/L (ref 12–78)
Sodium: 137 mmol/L (ref 136–145)
Total Protein: 7.8 g/dL (ref 6.4–8.2)

## 2012-11-03 LAB — LIPASE, BLOOD: Lipase: 70 U/L — ABNORMAL LOW (ref 73–393)

## 2012-11-04 LAB — CBC WITH DIFFERENTIAL/PLATELET
Basophil #: 0.1 10*3/uL (ref 0.0–0.1)
Basophil %: 0.8 %
Eosinophil #: 0.4 10*3/uL (ref 0.0–0.7)
Eosinophil %: 6 %
HCT: 29.7 % — ABNORMAL LOW (ref 35.0–47.0)
HGB: 9.5 g/dL — ABNORMAL LOW (ref 12.0–16.0)
Lymphocyte #: 3.4 10*3/uL (ref 1.0–3.6)
Lymphocyte %: 46.1 %
MCH: 30.4 pg (ref 26.0–34.0)
MCHC: 32 g/dL (ref 32.0–36.0)
MCV: 95 fL (ref 80–100)
Monocyte #: 0.5 x10 3/mm (ref 0.2–0.9)
Monocyte %: 7.3 %
Neutrophil #: 2.9 10*3/uL (ref 1.4–6.5)
Neutrophil %: 39.8 %
Platelet: 157 10*3/uL (ref 150–440)
RBC: 3.13 10*6/uL — ABNORMAL LOW (ref 3.80–5.20)
RDW: 14.1 % (ref 11.5–14.5)
WBC: 7.3 10*3/uL (ref 3.6–11.0)

## 2012-11-04 LAB — BASIC METABOLIC PANEL
Anion Gap: 9 (ref 7–16)
BUN: 28 mg/dL — ABNORMAL HIGH (ref 7–18)
Calcium, Total: 8.8 mg/dL (ref 8.5–10.1)
Chloride: 103 mmol/L (ref 98–107)
Co2: 28 mmol/L (ref 21–32)
Creatinine: 8.08 mg/dL — ABNORMAL HIGH (ref 0.60–1.30)
EGFR (African American): 6 — ABNORMAL LOW
EGFR (Non-African Amer.): 5 — ABNORMAL LOW
Glucose: 109 mg/dL — ABNORMAL HIGH (ref 65–99)
Osmolality: 285 (ref 275–301)
Potassium: 3.5 mmol/L (ref 3.5–5.1)
Sodium: 140 mmol/L (ref 136–145)

## 2012-11-05 LAB — PHOSPHORUS: Phosphorus: 4.2 mg/dL (ref 2.5–4.9)

## 2012-11-07 LAB — CLOSTRIDIUM DIFFICILE BY PCR

## 2012-11-07 IMAGING — XA IR VASCULAR PROCEDURE
8 series · 13 of 13 positions shown · non-contrast
Comparison: none

[Series 1: care upper arm · 2 of 2 slices shown (1 of 5)]
[im 1/2]
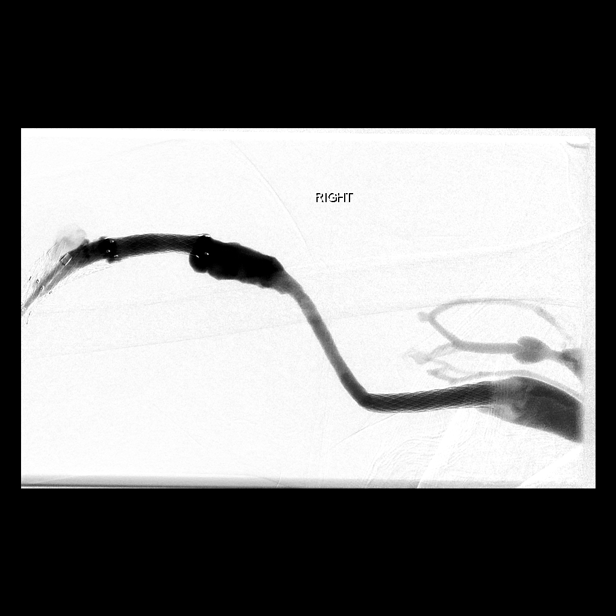
[im 2/2]
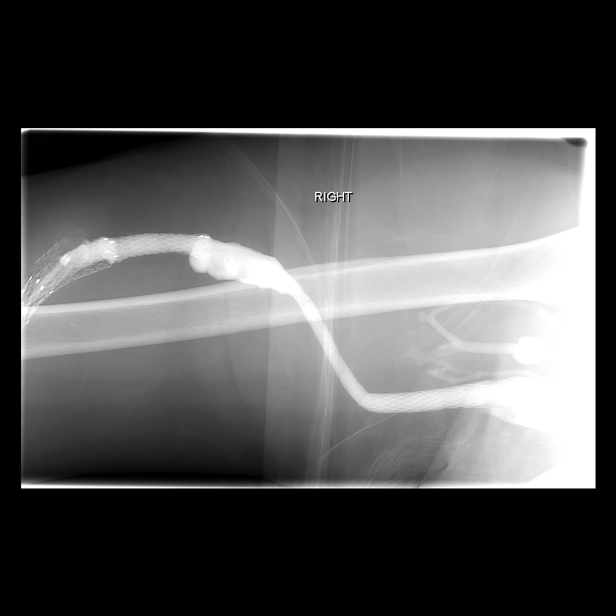

[Series 2: care upper arm · 2 of 2 slices shown (2 of 5)]
[im 1/2]
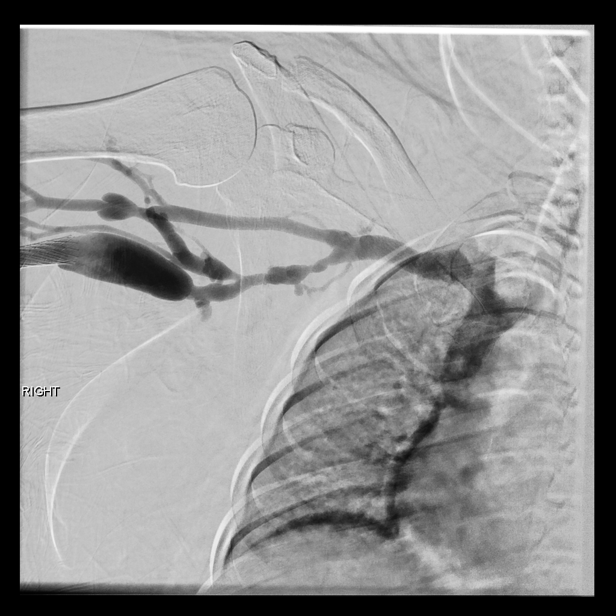
[im 2/2]
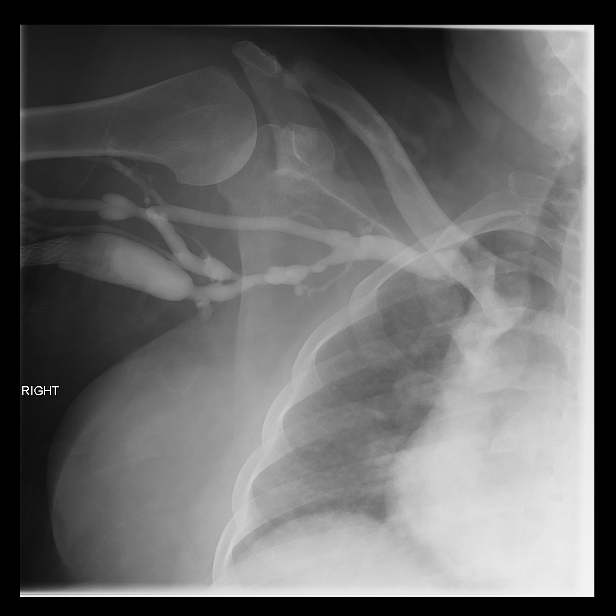

[Series 3: fl - angio · 1 of 1 slices shown (1 of 3)]
[im 1/1]
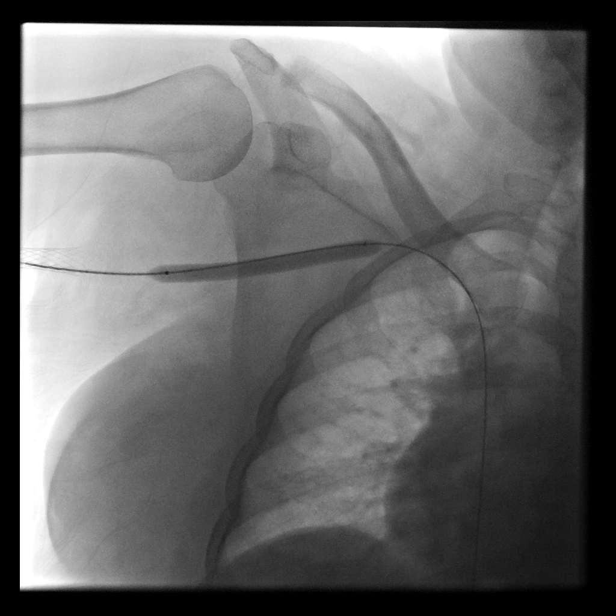

[Series 4: care upper arm · 2 of 2 slices shown (3 of 5)]
[im 1/2]
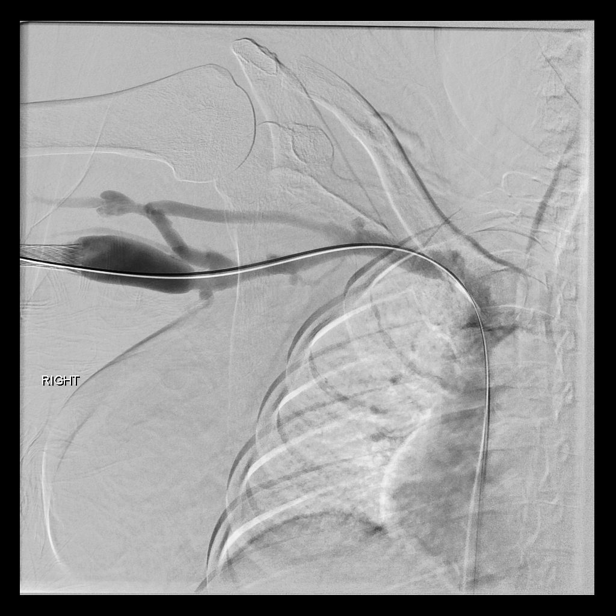
[im 2/2]
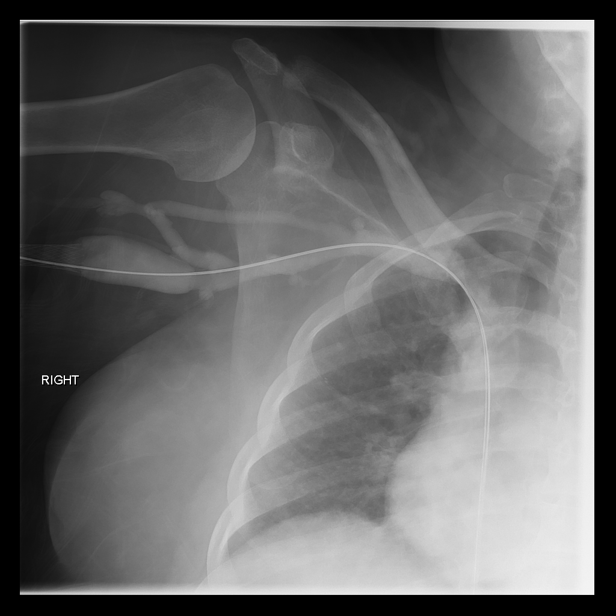

[Series 5: fl - angio · 1 of 1 slices shown (2 of 3)]
[im 1/1]
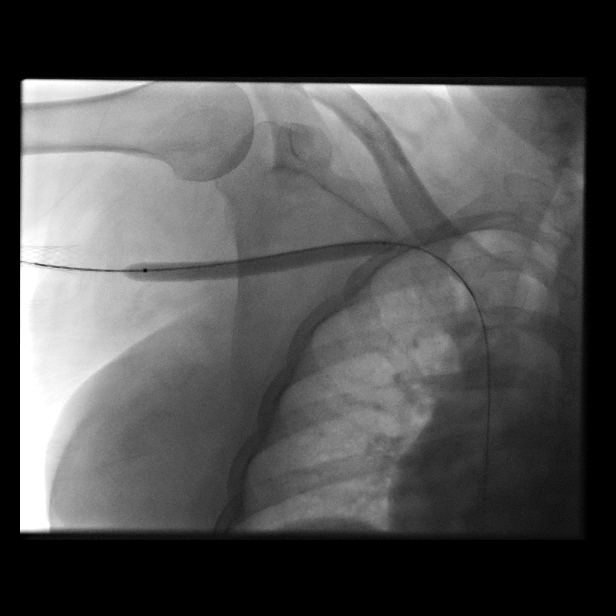

[Series 6: care upper arm · 2 of 2 slices shown (4 of 5)]
[im 1/2]
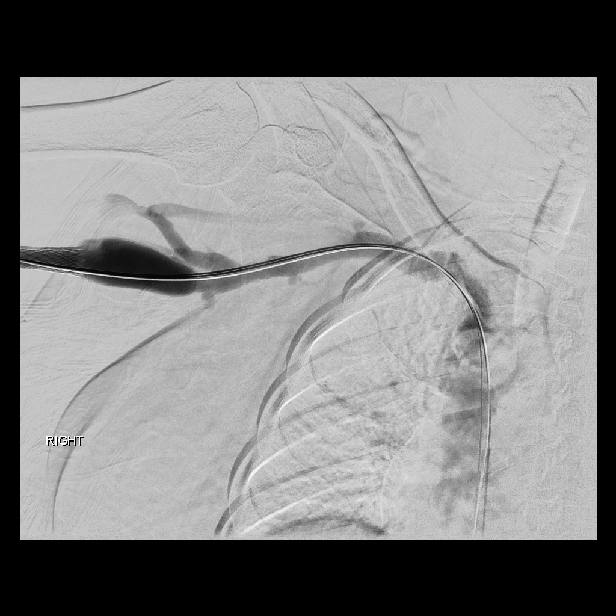
[im 2/2]
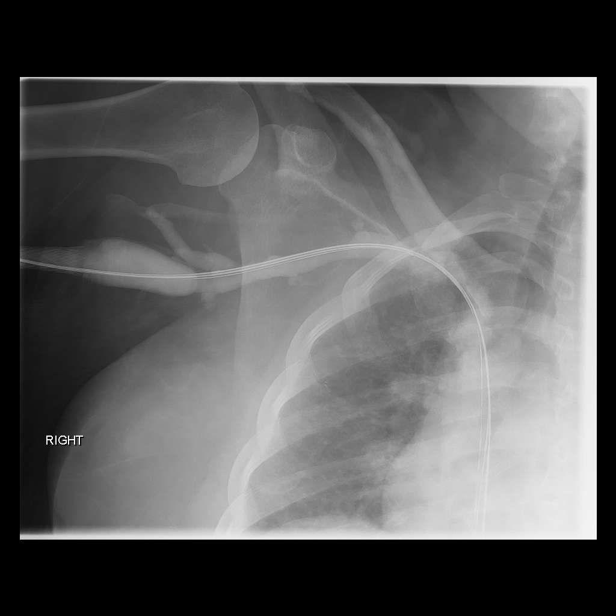

[Series 7: fl - angio · 1 of 1 slices shown (3 of 3)]
[im 1/1]
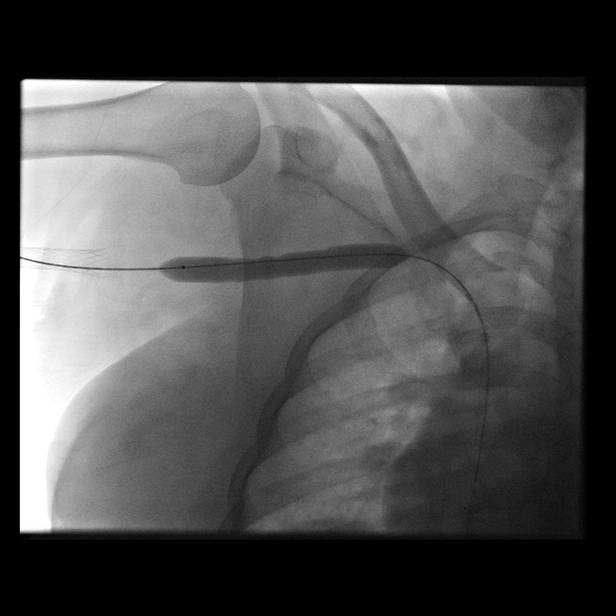

[Series 8: care upper arm · 2 of 2 slices shown (5 of 5)]
[im 1/2]
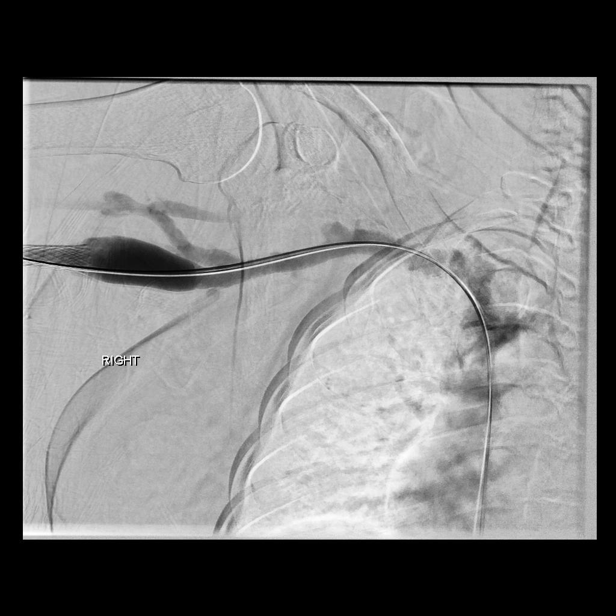
[im 2/2]
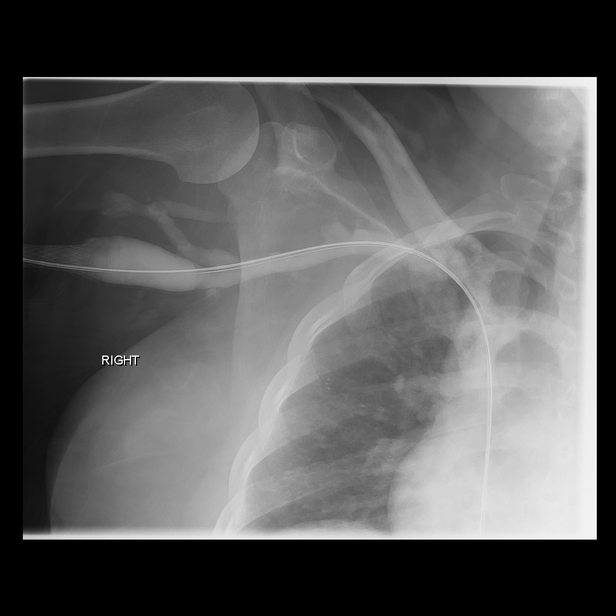

[13 of 13 positions shown; findings below may reference images not displayed]

IMAGES IMPORTED FROM THE [employer]
NO DICTATION FOR STUDY

## 2012-11-08 LAB — STOOL CULTURE

## 2012-12-26 IMAGING — RF DG BARIUM SWALLOW
1 series · 15 of 21 positions shown · non-contrast
Comparison: none

REASON FOR EXAM: Goiter and Dysphagia
COMMENTS:

[Series 1: run · 15 acquisitions, 15 frames shown]
[im 1/15]
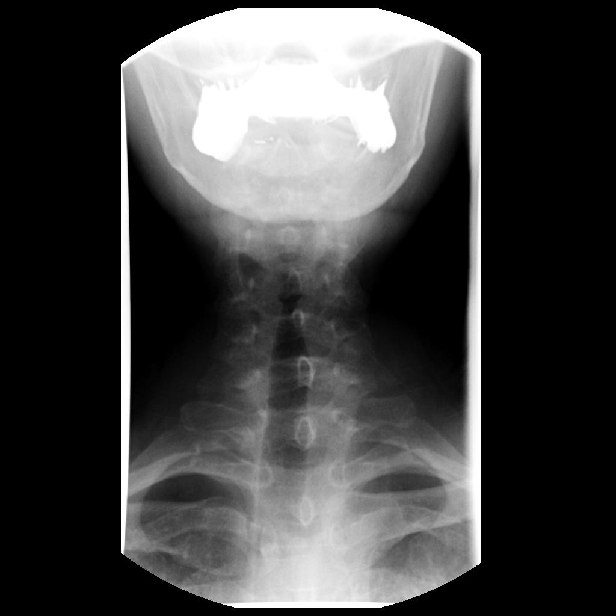
[im 1/15]
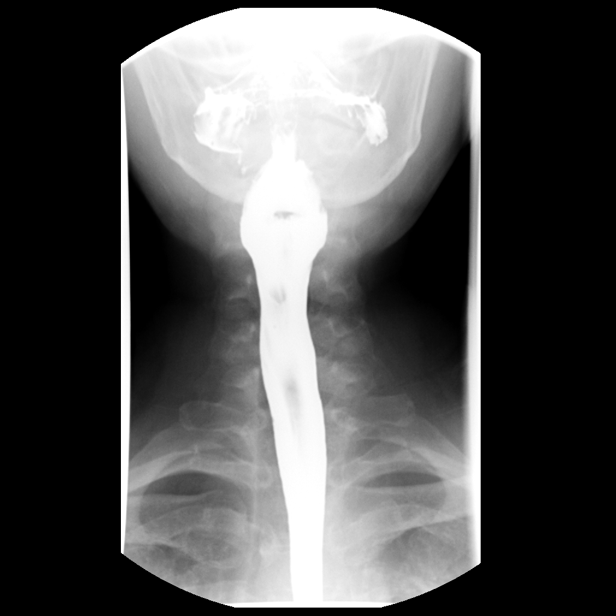
[im 1/15]
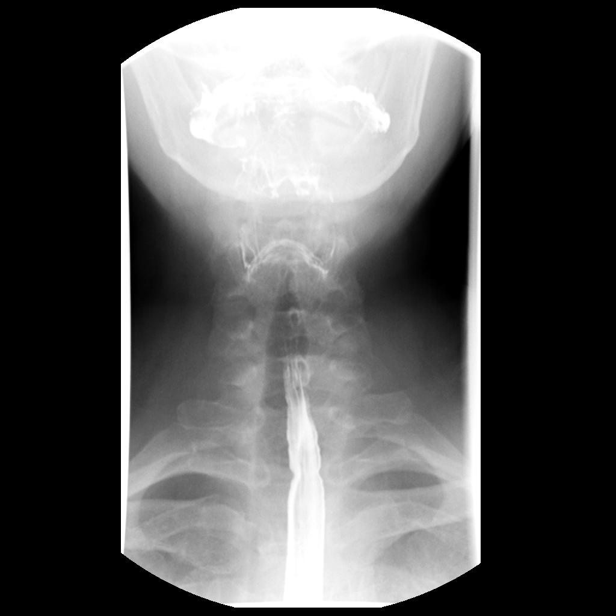
[im 2/15]
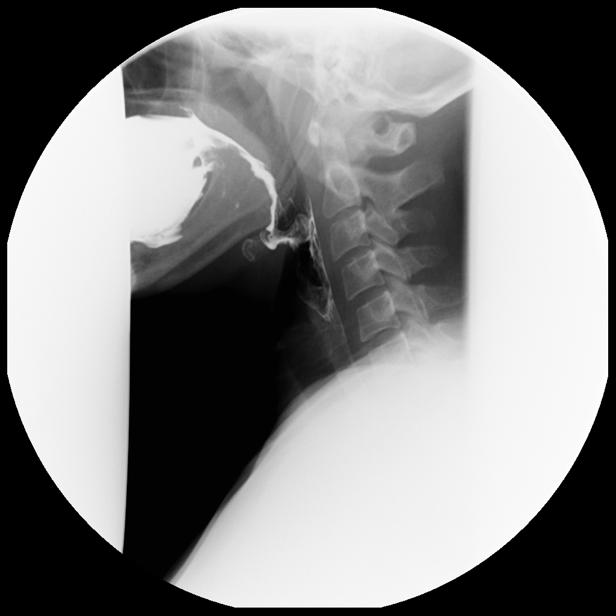
[im 2/15]
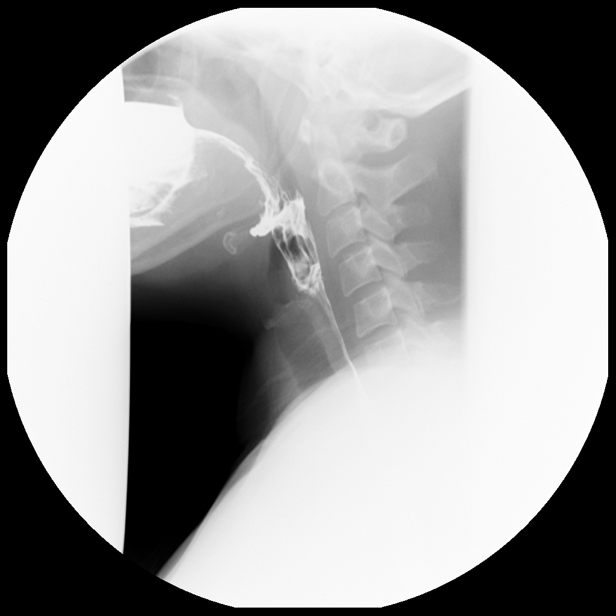
[im 2/15]
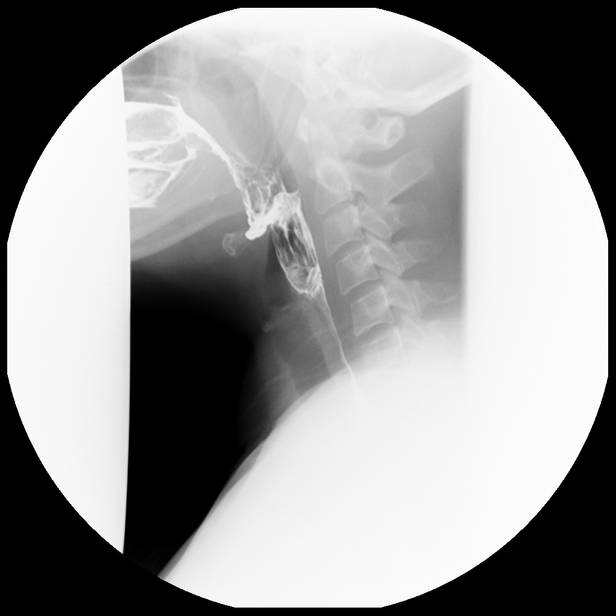
[im 4/15]
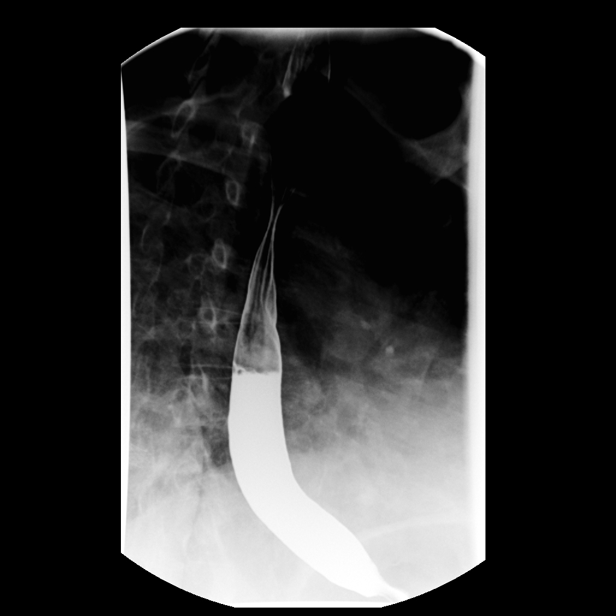
[im 5/15]
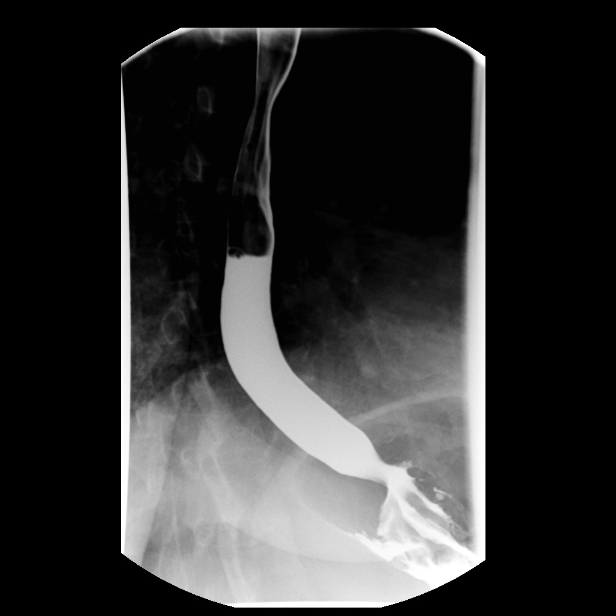
[im 6/15]
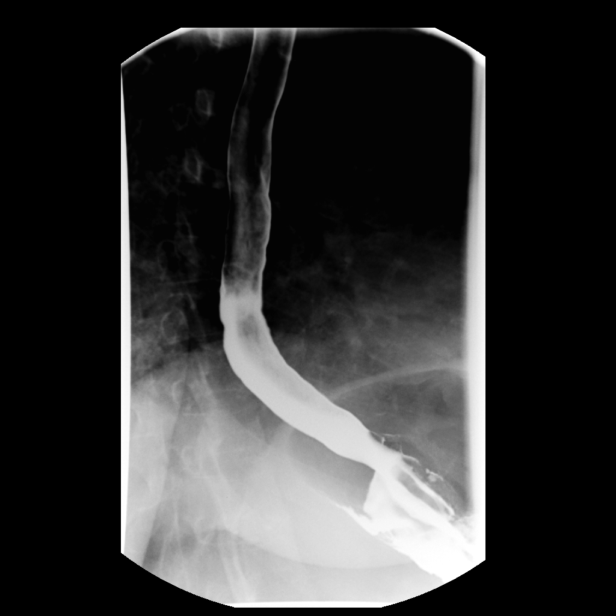
[im 8/15]
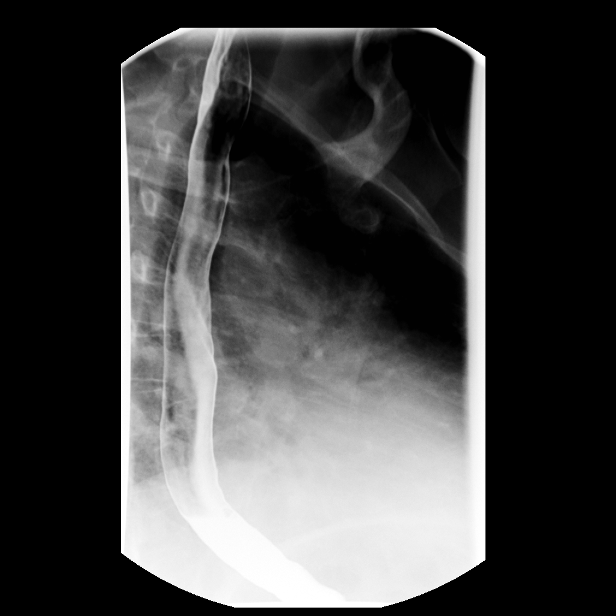
[im 9/15]
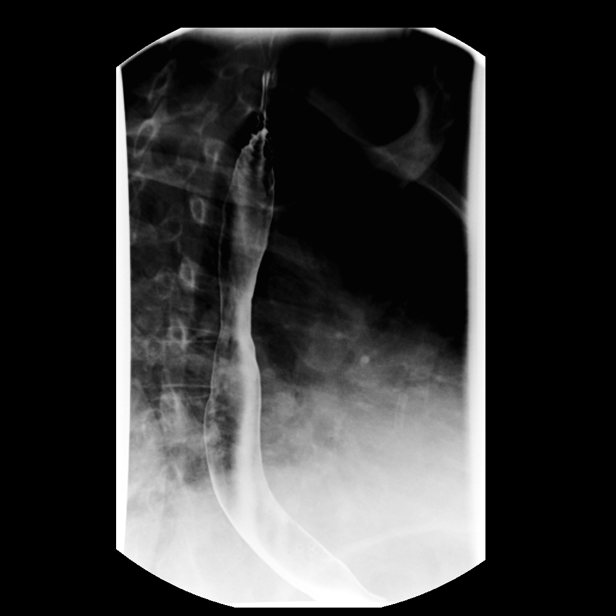
[im 11/15]
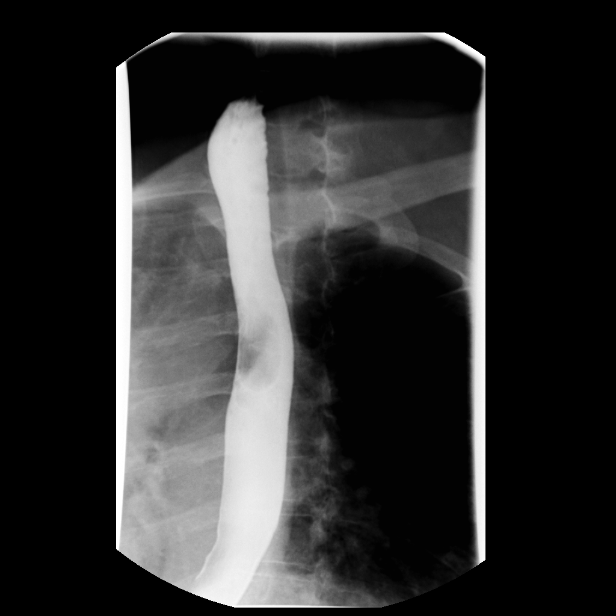
[im 12/15]
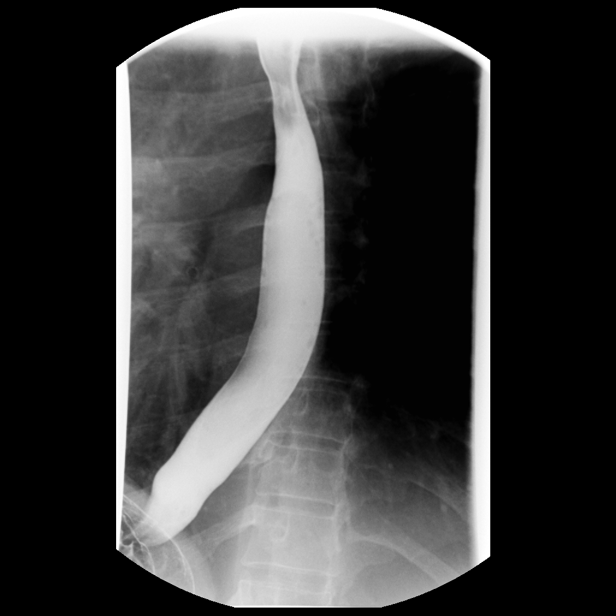
[im 13/15]
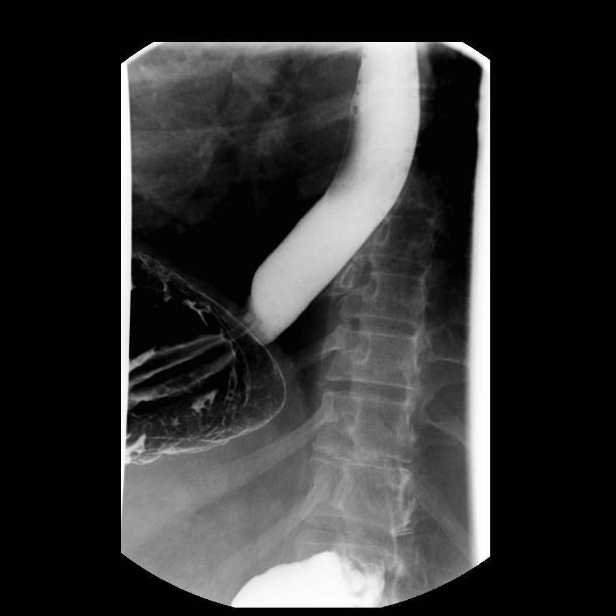
[im 15/15]
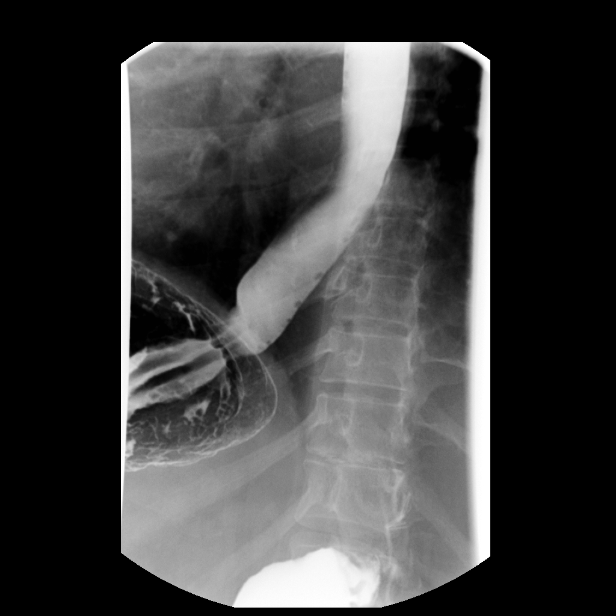

[15 of 21 positions shown; findings below may reference images not displayed]

PROCEDURE:     FL  - FL BARIUM SWALLOW  - August 20, 2012 [DATE]

RESULT:

Procedure: Dynamic imaging of the cervical esophagus was performed during
active swallowing of barium. This was followed by administration of
effervescent crystals and administration of barium. Representative imaging
was obtained of the esophagus.
FINDINGS: The cervical esophagus is unremarkable. A single swallow was
evaluated which was unremarkable. The remaining components of the thoracic
esophagus are unremarkable. There is appropriate relaxation of the lower
esophageal sphincter. There is no evidence of a hiatal hernia or
gastroesophageal reflux. A 13 mm, K[place]lelo Ulphred was administered which traversed
the esophagus without complications.
IMPRESSION: Unremarkable barium swallow examination as described above.

## 2012-12-26 IMAGING — US THYROID ULTRASOUND
1 series · 14 of 25 positions shown · non-contrast
Comparison: none

REASON FOR EXAM: Goiter and Dysphagia
COMMENTS:

[Series 1: thyroid ultrasound · 0.08mm/px · 14 of 48 slices shown]
[im 1/48]
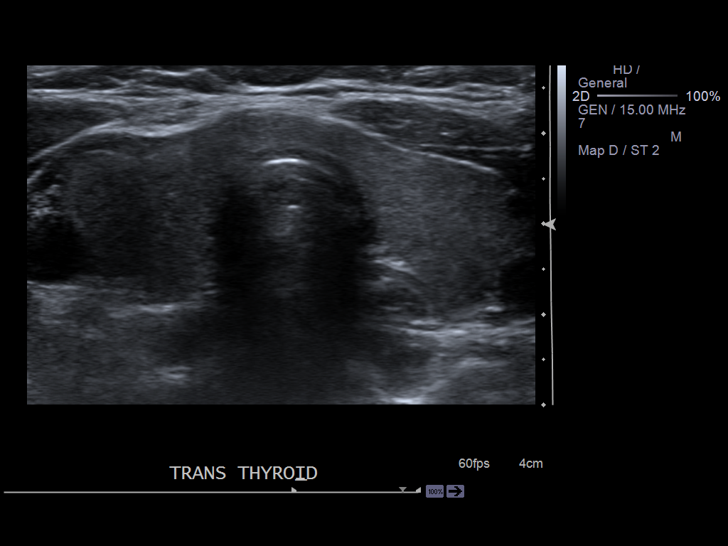
[im 4/48]
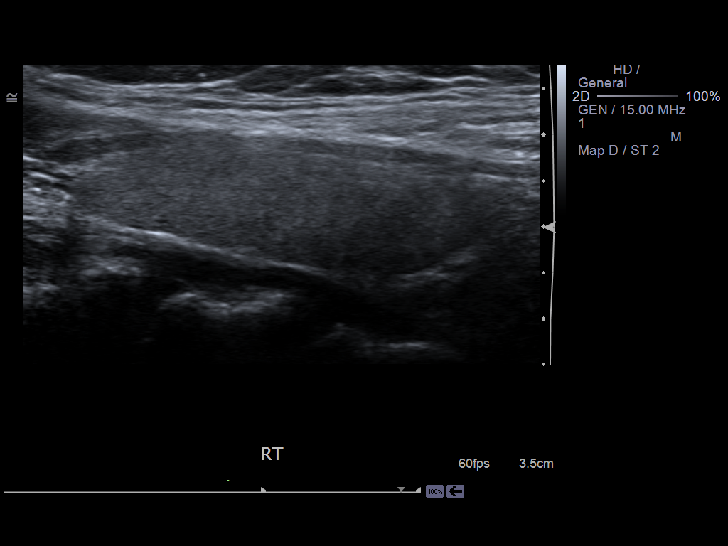
[im 8/48]
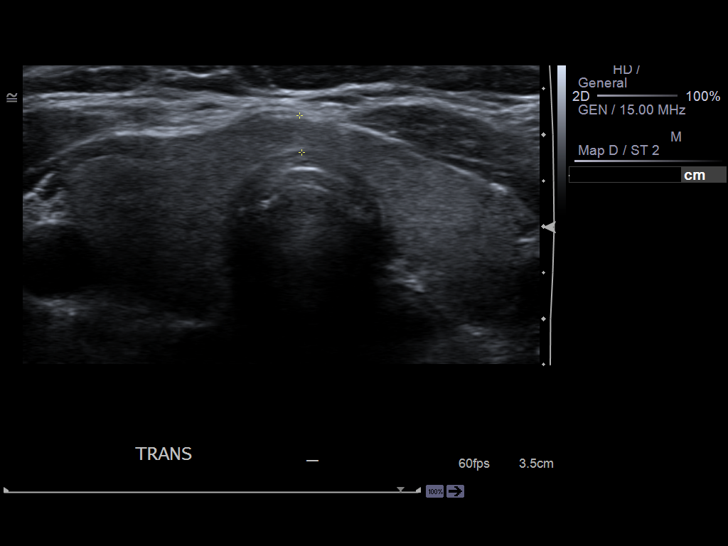
[im 12/48]
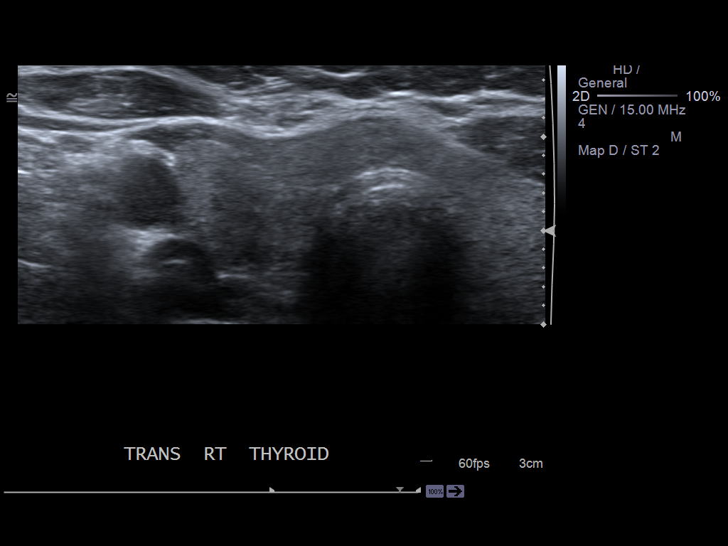
[im 16/48]
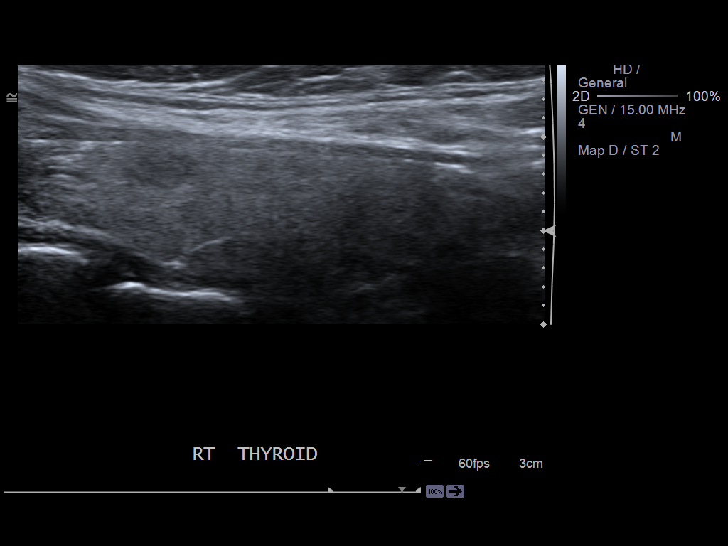
[im 18/48]
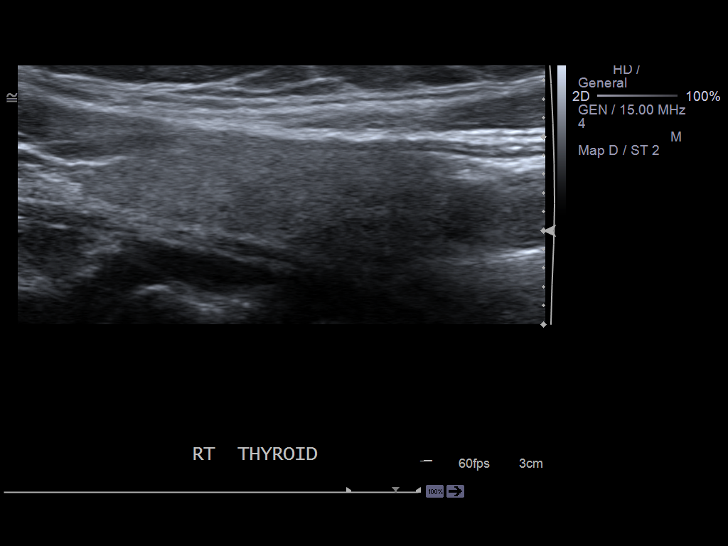
[im 22/48]
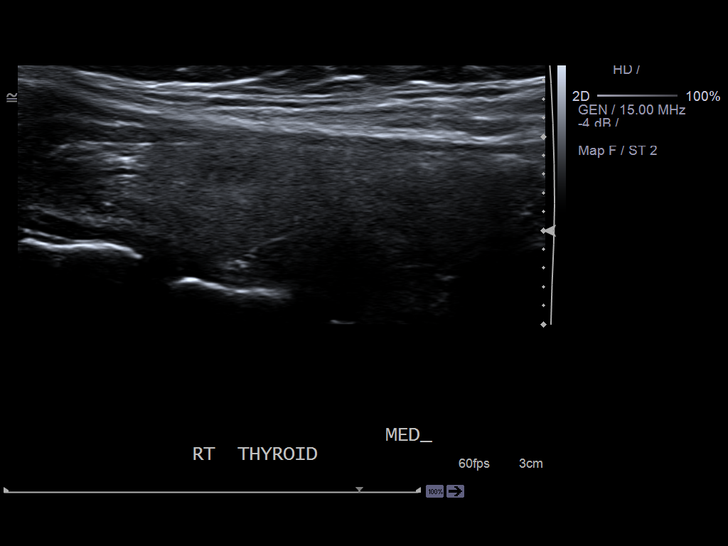
[im 26/48]
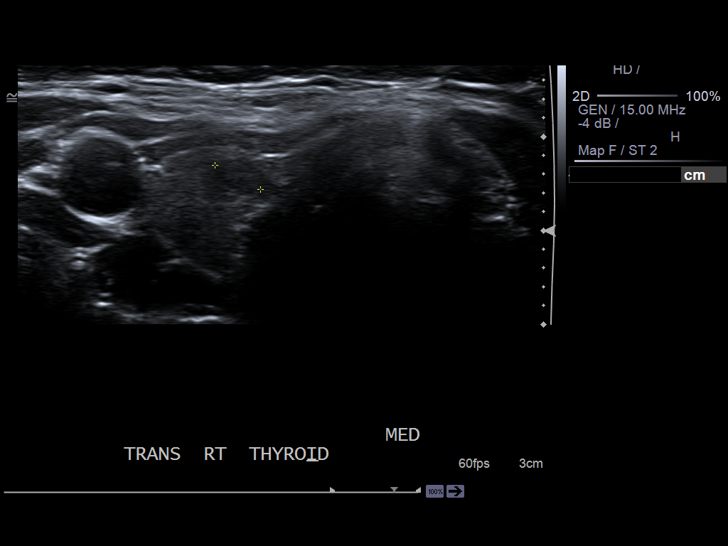
[im 30/48]
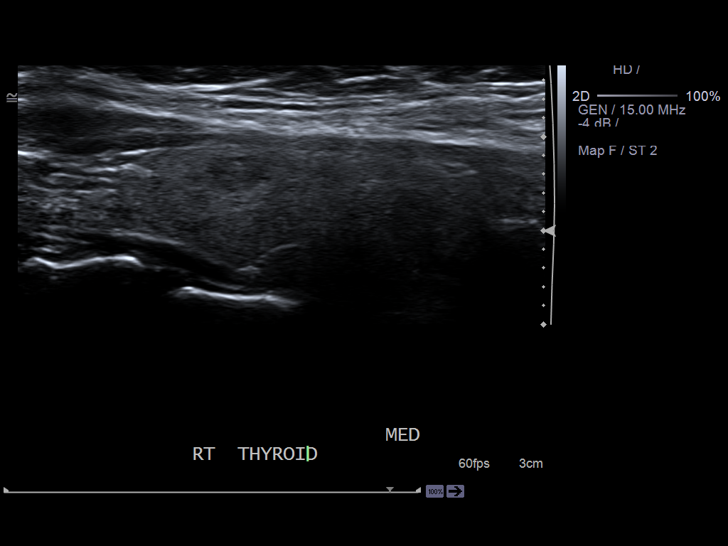
[im 32/48]
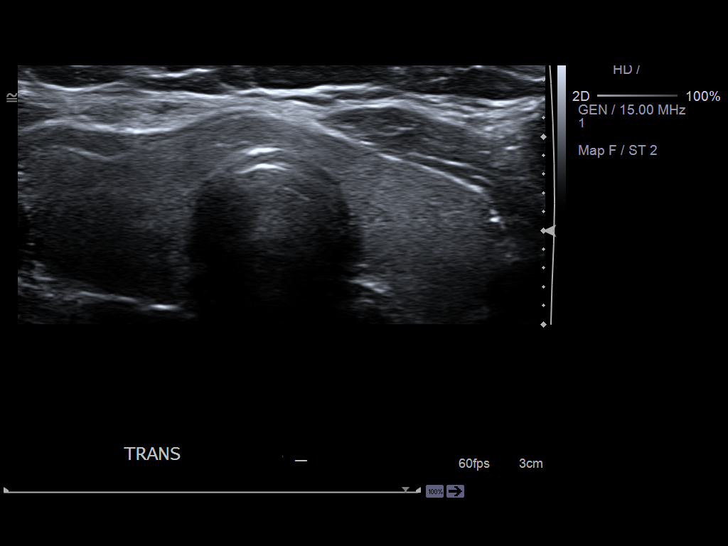
[im 36/48]
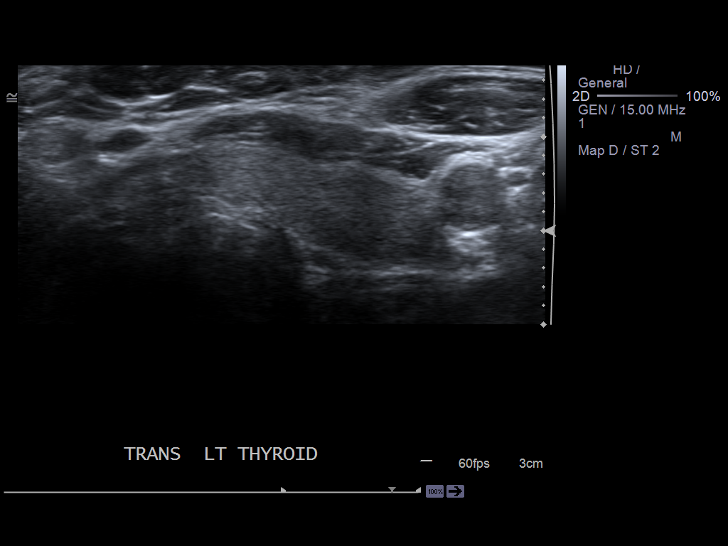
[im 40/48]
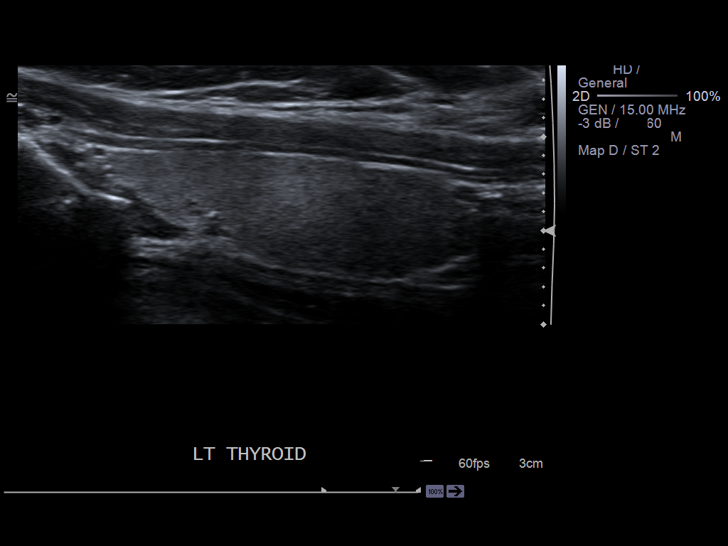
[im 44/48]
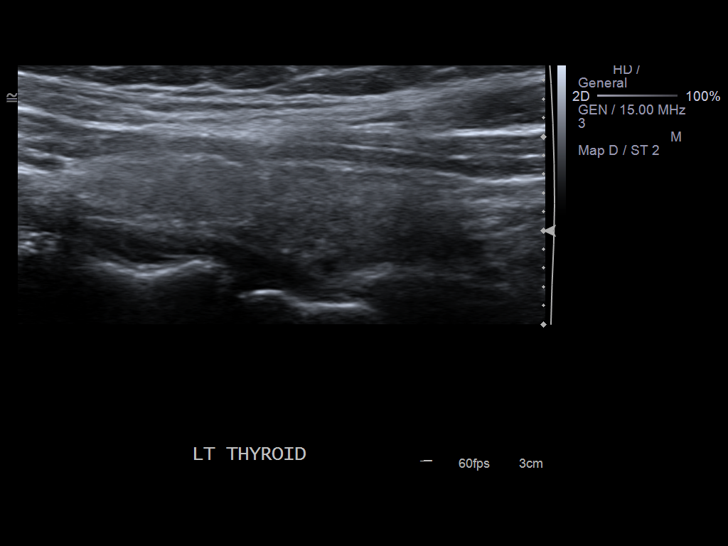
[im 48/48]
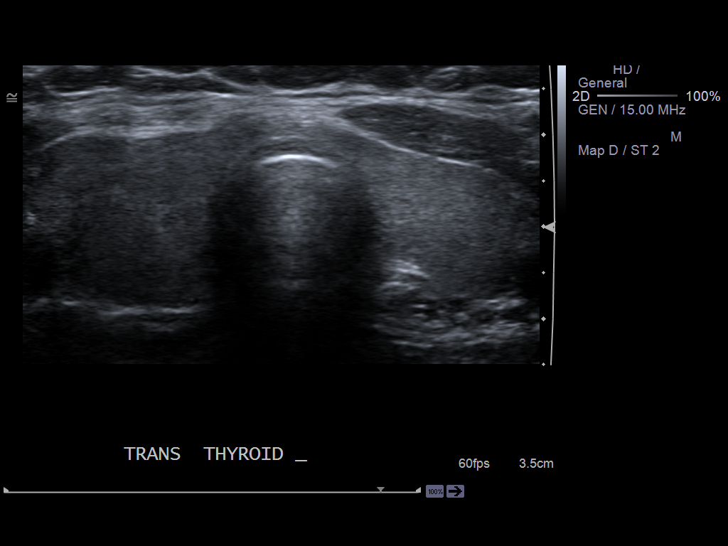

[14 of 25 positions shown; findings below may reference images not displayed]

PROCEDURE:     US  - US SOFT TISSUE HEAD/NECK/THYROID  - August 20, 2012 [DATE]

RESULT:

Sonographic evaluation of the thyroid lobes shows the right thyroid lobe
measures 4.65 x 1.88 x 1.41 cm. The left lobe measures 4.66 x 1.86 x
cm. The isthmus shows an anterior to posterior thickness of 0.40 cm.

Within the right thyroid lobe, in the upper pole region, there is a fairly
well demarcated, hypoechoic, solid appearing lesion of 0.72 x 0.38 x
cm. The echotexture is otherwise grossly normal. No calcifications are
evident.
IMPRESSION: Slightly hypoechoic, solid appearing, right upper pole
nodule. Echotexture is otherwise unremarkable. No definite evidence of
malignancy.

[REDACTED]

## 2013-02-18 ENCOUNTER — Ambulatory Visit: Payer: Self-pay | Admitting: Cardiovascular Disease

## 2013-02-18 LAB — BASIC METABOLIC PANEL
Anion Gap: 9 (ref 7–16)
BUN: 30 mg/dL — ABNORMAL HIGH (ref 7–18)
Calcium, Total: 8.8 mg/dL (ref 8.5–10.1)
Chloride: 99 mmol/L (ref 98–107)
Co2: 25 mmol/L (ref 21–32)
Creatinine: 7.21 mg/dL — ABNORMAL HIGH (ref 0.60–1.30)
EGFR (African American): 7 — ABNORMAL LOW
EGFR (Non-African Amer.): 6 — ABNORMAL LOW
Glucose: 139 mg/dL — ABNORMAL HIGH (ref 65–99)
Osmolality: 275 (ref 275–301)
Potassium: 5.4 mmol/L — ABNORMAL HIGH (ref 3.5–5.1)
Sodium: 133 mmol/L — ABNORMAL LOW (ref 136–145)

## 2013-03-03 ENCOUNTER — Ambulatory Visit: Payer: Self-pay | Admitting: Vascular Surgery

## 2013-03-10 IMAGING — CR DG ABDOMEN 3V
1 series · 4 of 4 positions shown · non-contrast
Comparison: none

REASON FOR EXAM: ABD PAIN
COMMENTS:

[Series 1: x chest ap · 0.14mm/px · 4 of 4 slices shown]
[im 1/4]
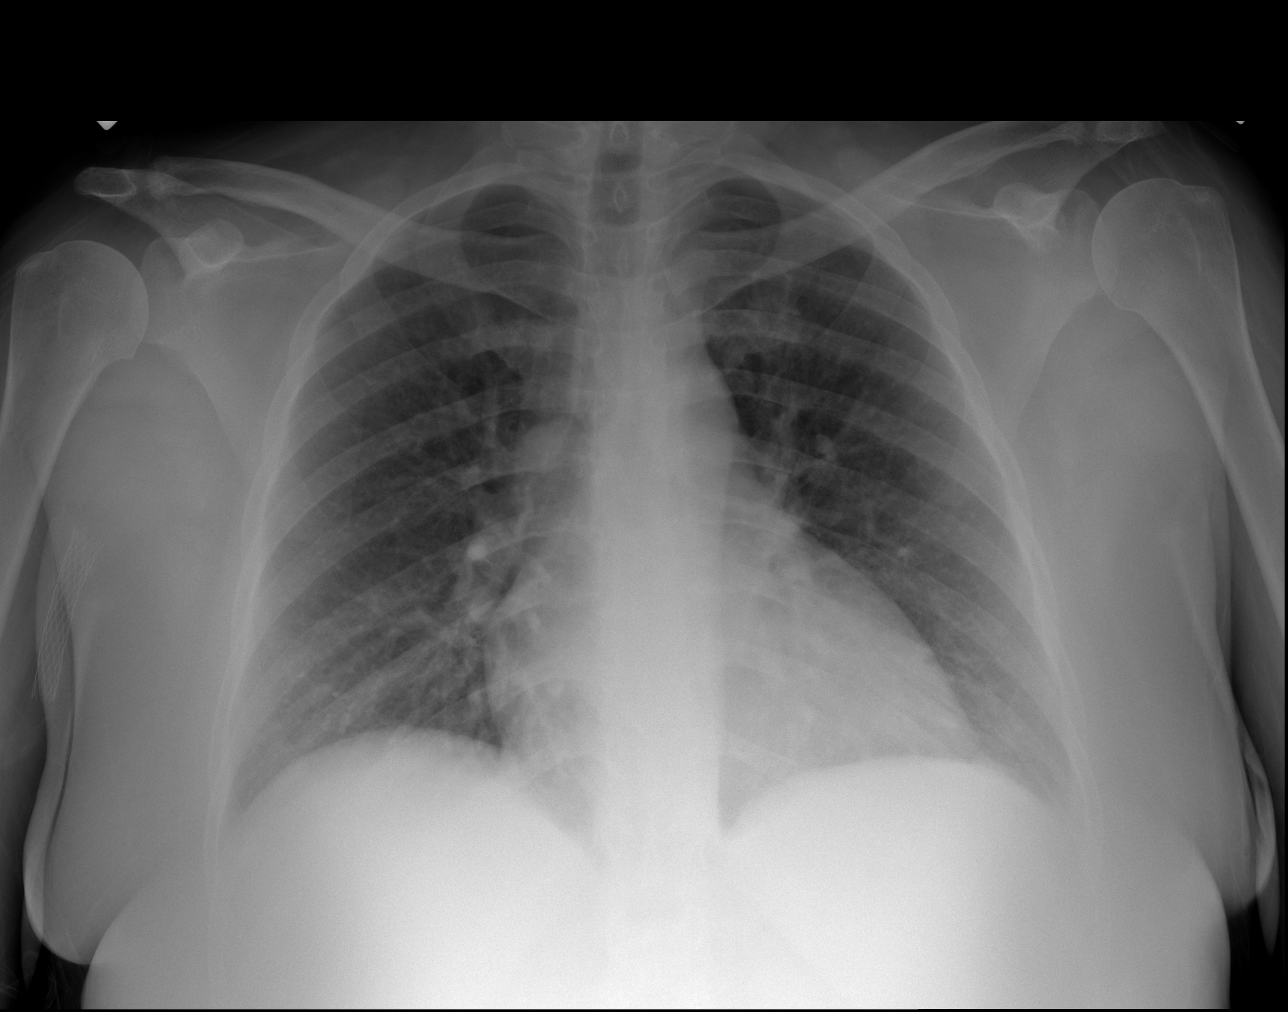
[im 2/4]
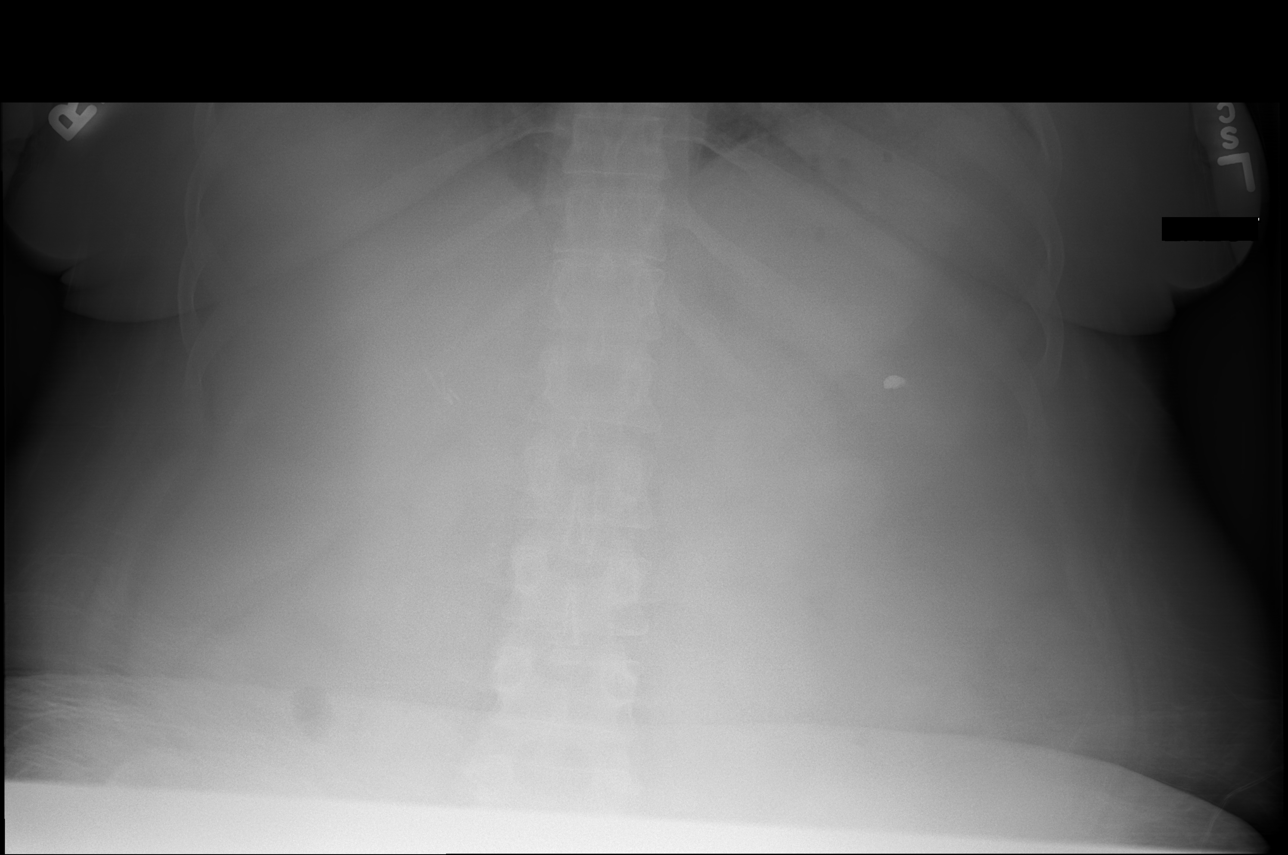
[im 3/4]
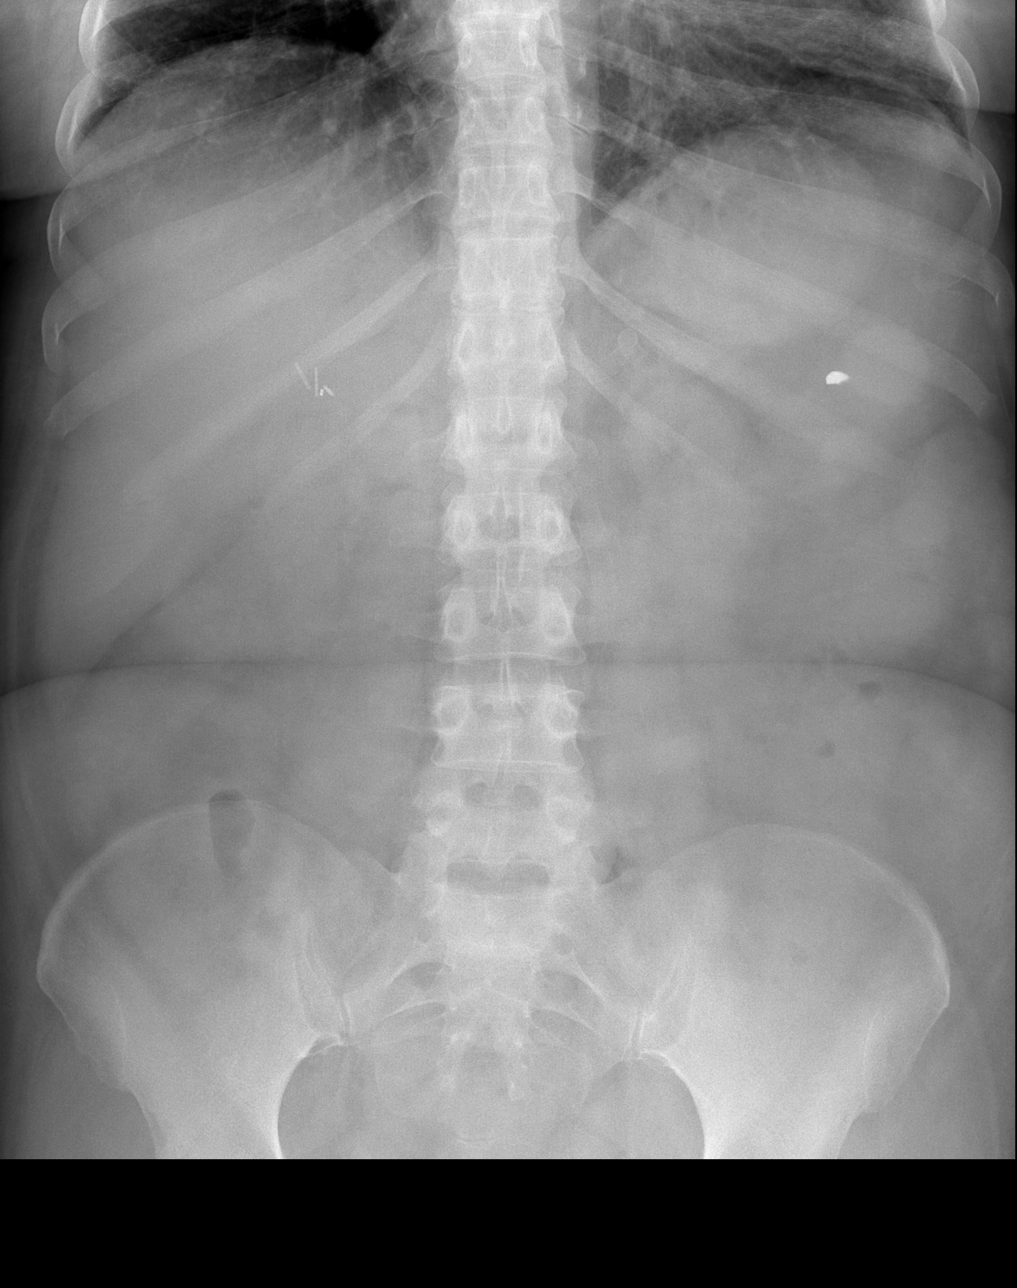
[im 4/4]
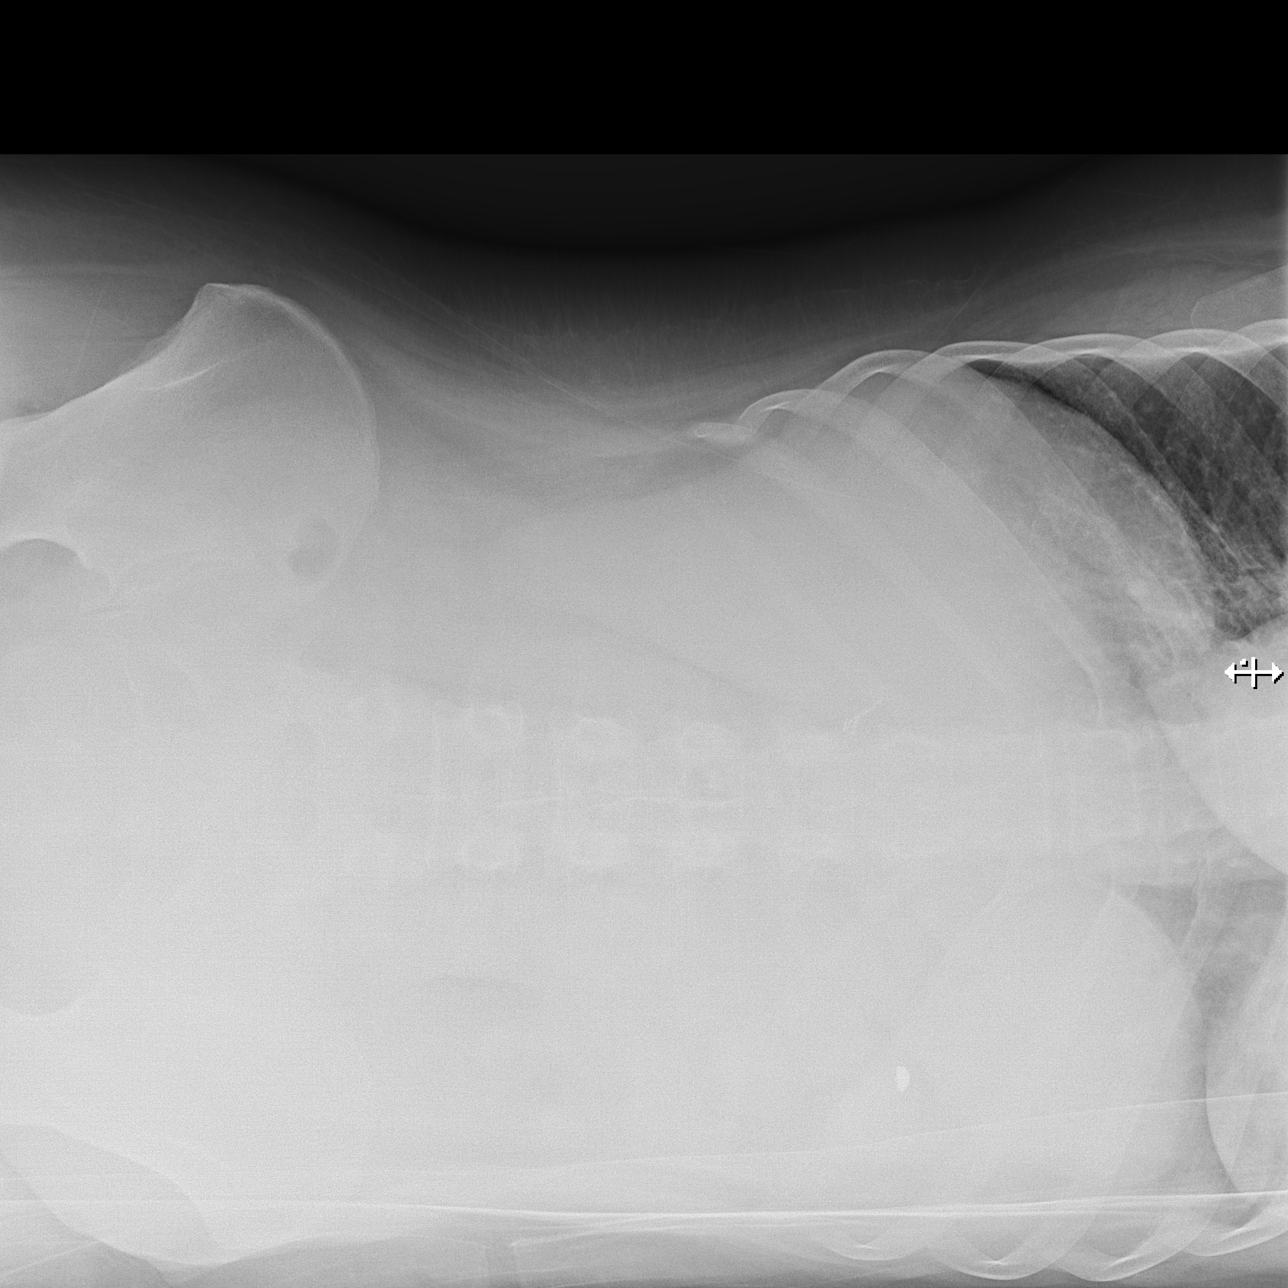

[4 of 4 positions shown; findings below may reference images not displayed]

PROCEDURE:     DXR - DXR ABDOMEN 3-WAY (INCL PA CXR)  - November 02, 2012 [DATE]

RESULT:     Surgical clips right upper quadrant. Radiopacity left upper
quadrant consistent variant. Gas pattern nonspecific. No free air. Lungs
clear. Cardiomegaly. No congestive heart failure. Chest stable from
03/06/2012.
IMPRESSION: Nonspecific exam.

## 2013-03-10 IMAGING — CT CT ABD-PELV W/O CM
1 of 2 series · 15 of 32 positions shown, 19 images · non-contrast
Comparison: none

REASON FOR EXAM: (1) right side abd pain; (2) abd pain
COMMENTS:

[Series 2: soft tissue · axial · 0.78mm/px · z∈[-174,+260]mm · 15 of 159 slices shown, 19 images]
[im 7/159  soft-tissue]
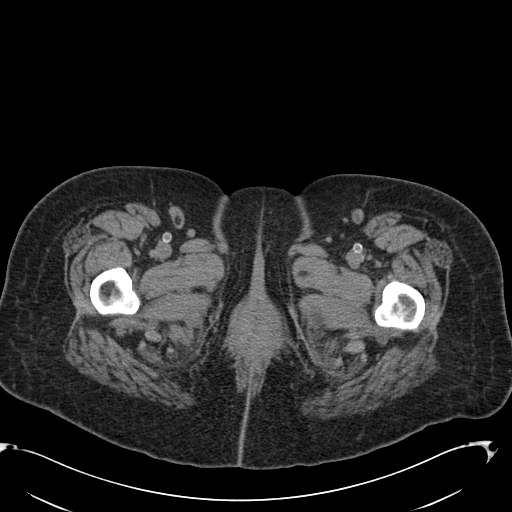
[im 7/159  bone]
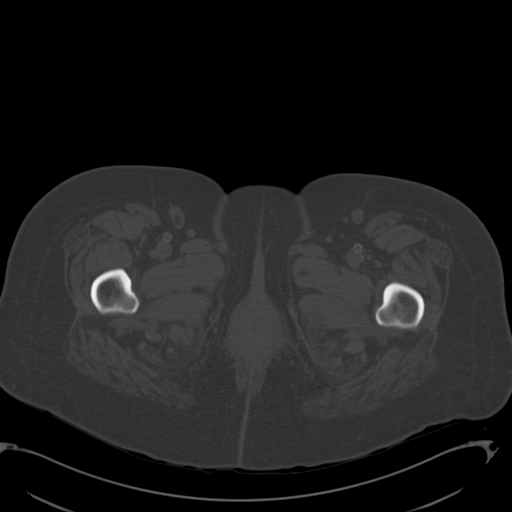
[im 20/159  soft-tissue]
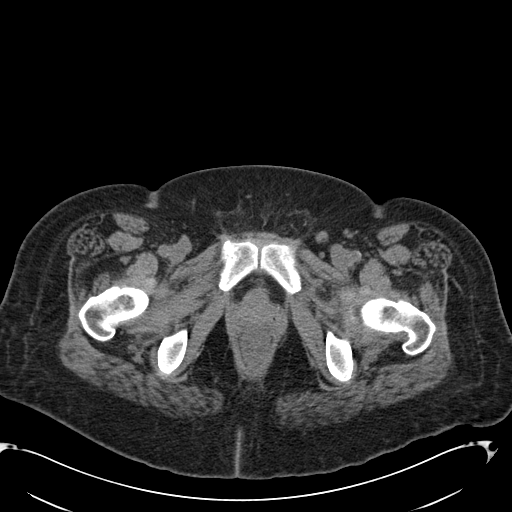
[im 33/159  soft-tissue]
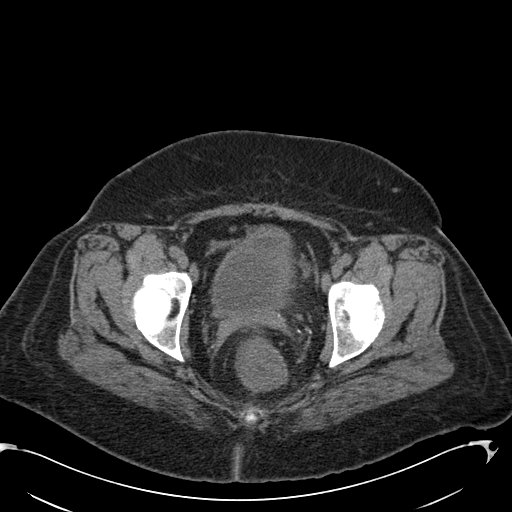
[im 47/159  soft-tissue]
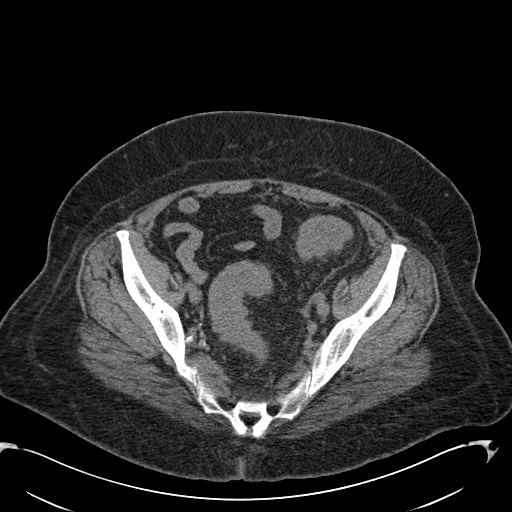
[im 53/159  soft-tissue]
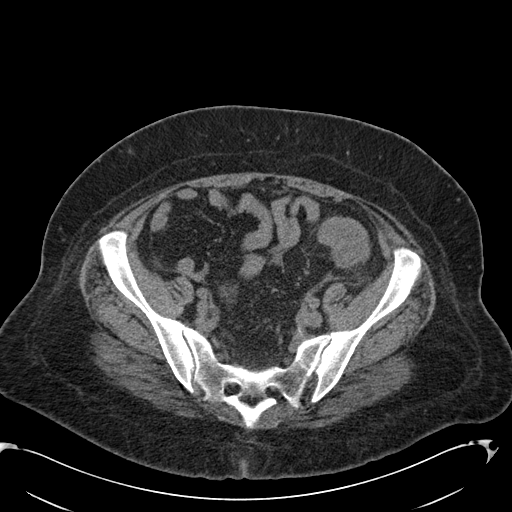
[im 66/159  soft-tissue]
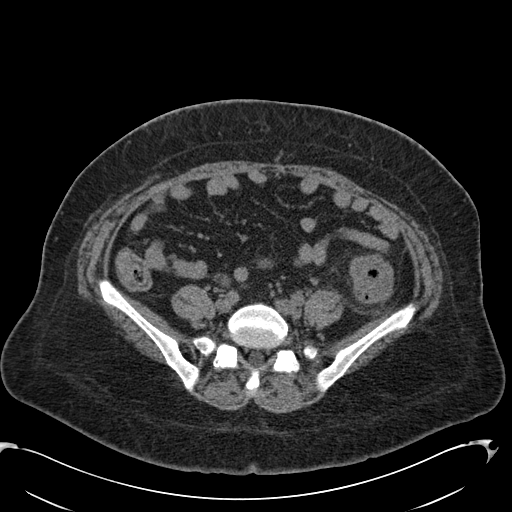
[im 80/159  soft-tissue]
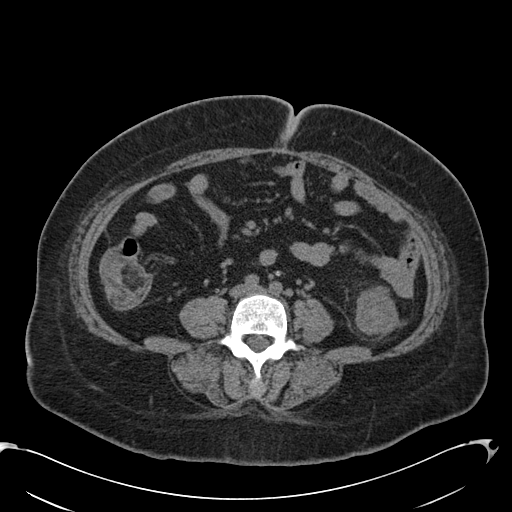
[im 93/159  soft-tissue]
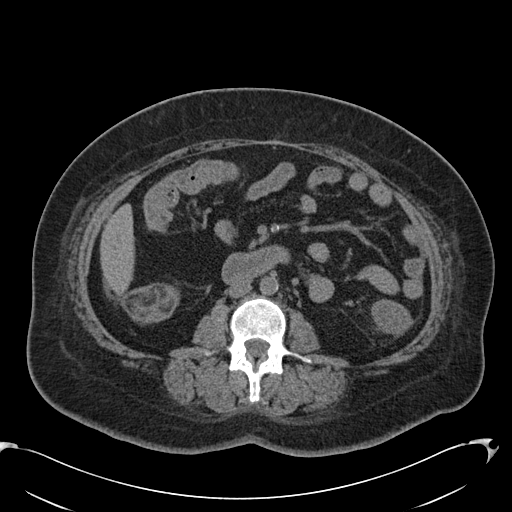
[im 106/159  soft-tissue]
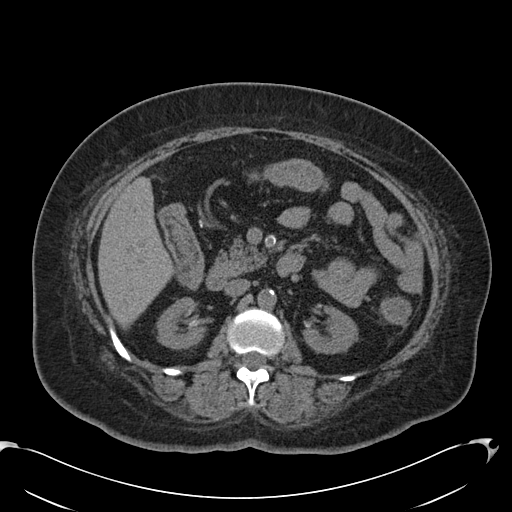
[im 106/159  bone]
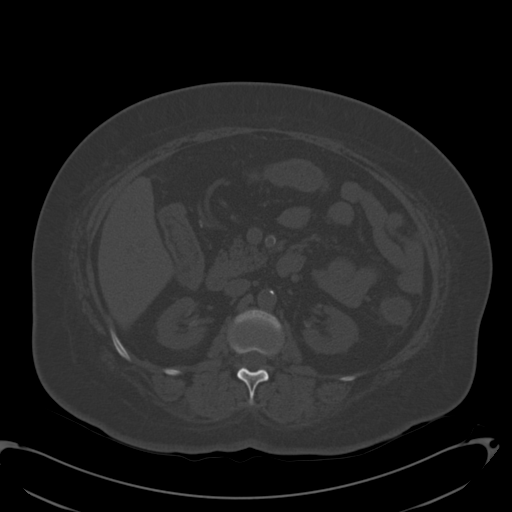
[im 112/159  soft-tissue]
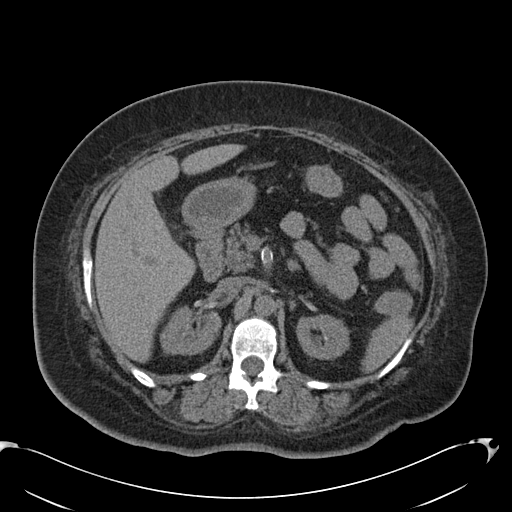
[im 126/159  soft-tissue]
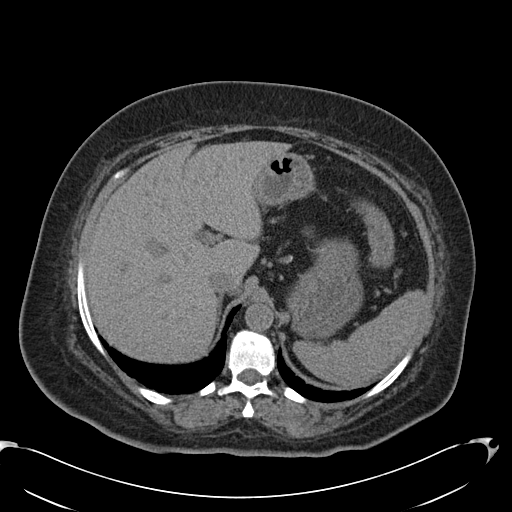
[im 132/159  lung]
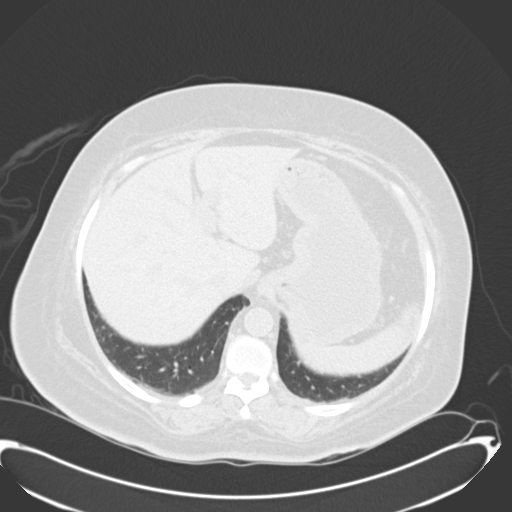
[im 139/159  soft-tissue]
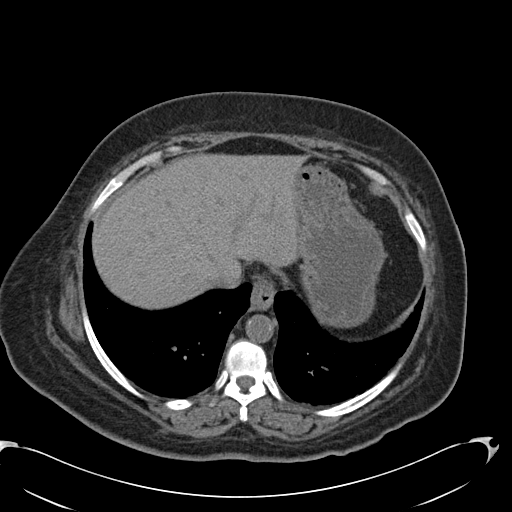
[im 139/159  lung]
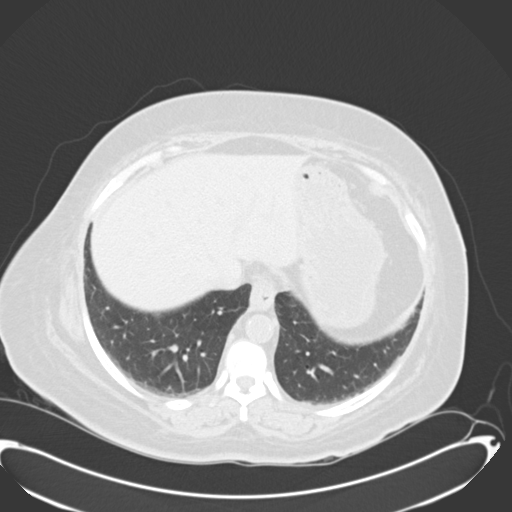
[im 145/159  lung]
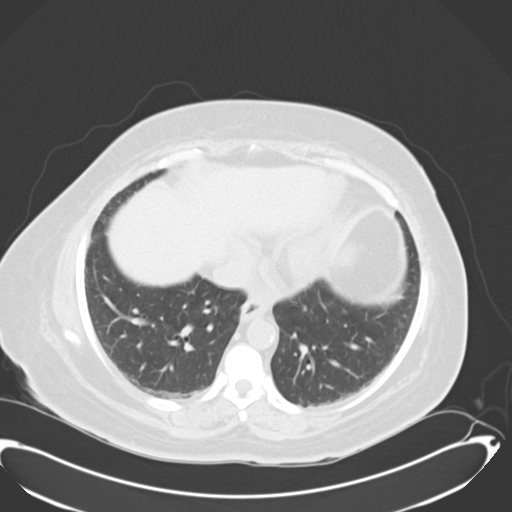
[im 152/159  soft-tissue]
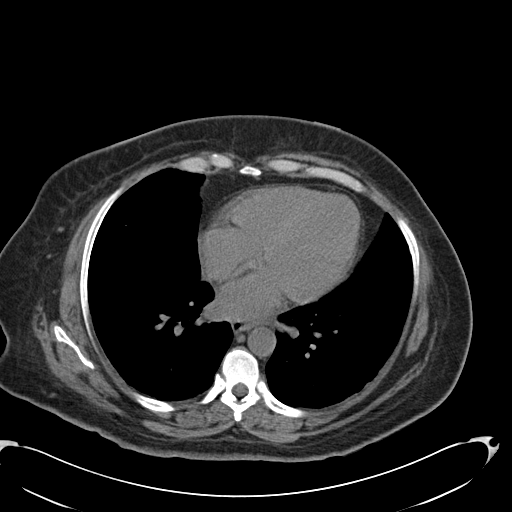
[im 152/159  lung]
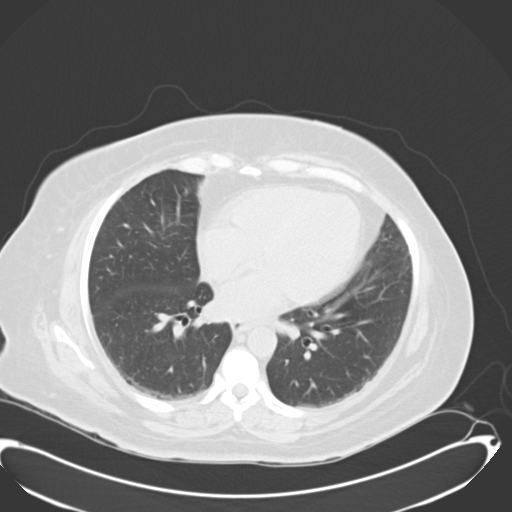

[15 of 32 positions shown; findings below may reference images not displayed]

PROCEDURE:     CT  - CT ABDOMEN AND PELVIS W[DATE] [DATE]

RESULT:     CT of the abdomen and pelvis without contrast is reconstructed
at 3 mm slice thickness. Comparison is made to previous noncontrast study 21 December, 2010 and to images dated 28 September, 2009.

There is some subtle pericolonic mesenteric stranding near the distal
descending colon and proximal sigmoid colon with slight thickening of the
wall concerning for colitis. Correlate clinically. There is no pneumatosis,
abscess or evidence of perforation. The wall the urinary bladder slightly
thickened but the bladder is nondistended and this may be artifactual. No
abnormal small bowel distention or wall thickening is evident. The stomach
is predominantly fluid-filled. Noncontrast images of the pancreas, liver,
spleen, adrenal glands and kidneys appear to be Aujla unremarkable.
Atherosclerotic calcification is noted in the aorta. No adenopathy is
evident. Cholecystectomy clips are present. The lung bases show some
dependent atelectasis or fibrosis.
IMPRESSION: Limited study without contrast suggests colitis in the
descending colon and proximal sigmoid colon. Slight thickening of the wall
of the urinary bladder. Correlate with urinalysis for cystitis. There is
some scattered colonic diverticulosis. Diverticulitis is felt to be less
likely differential consideration but is not excluded.

[REDACTED]

## 2013-03-11 IMAGING — CR DG CHEST 1V PORT
1 series · 1 of 1 positions shown · non-contrast
Comparison: none

REASON FOR EXAM: missed dialysis
COMMENTS:

[ap]
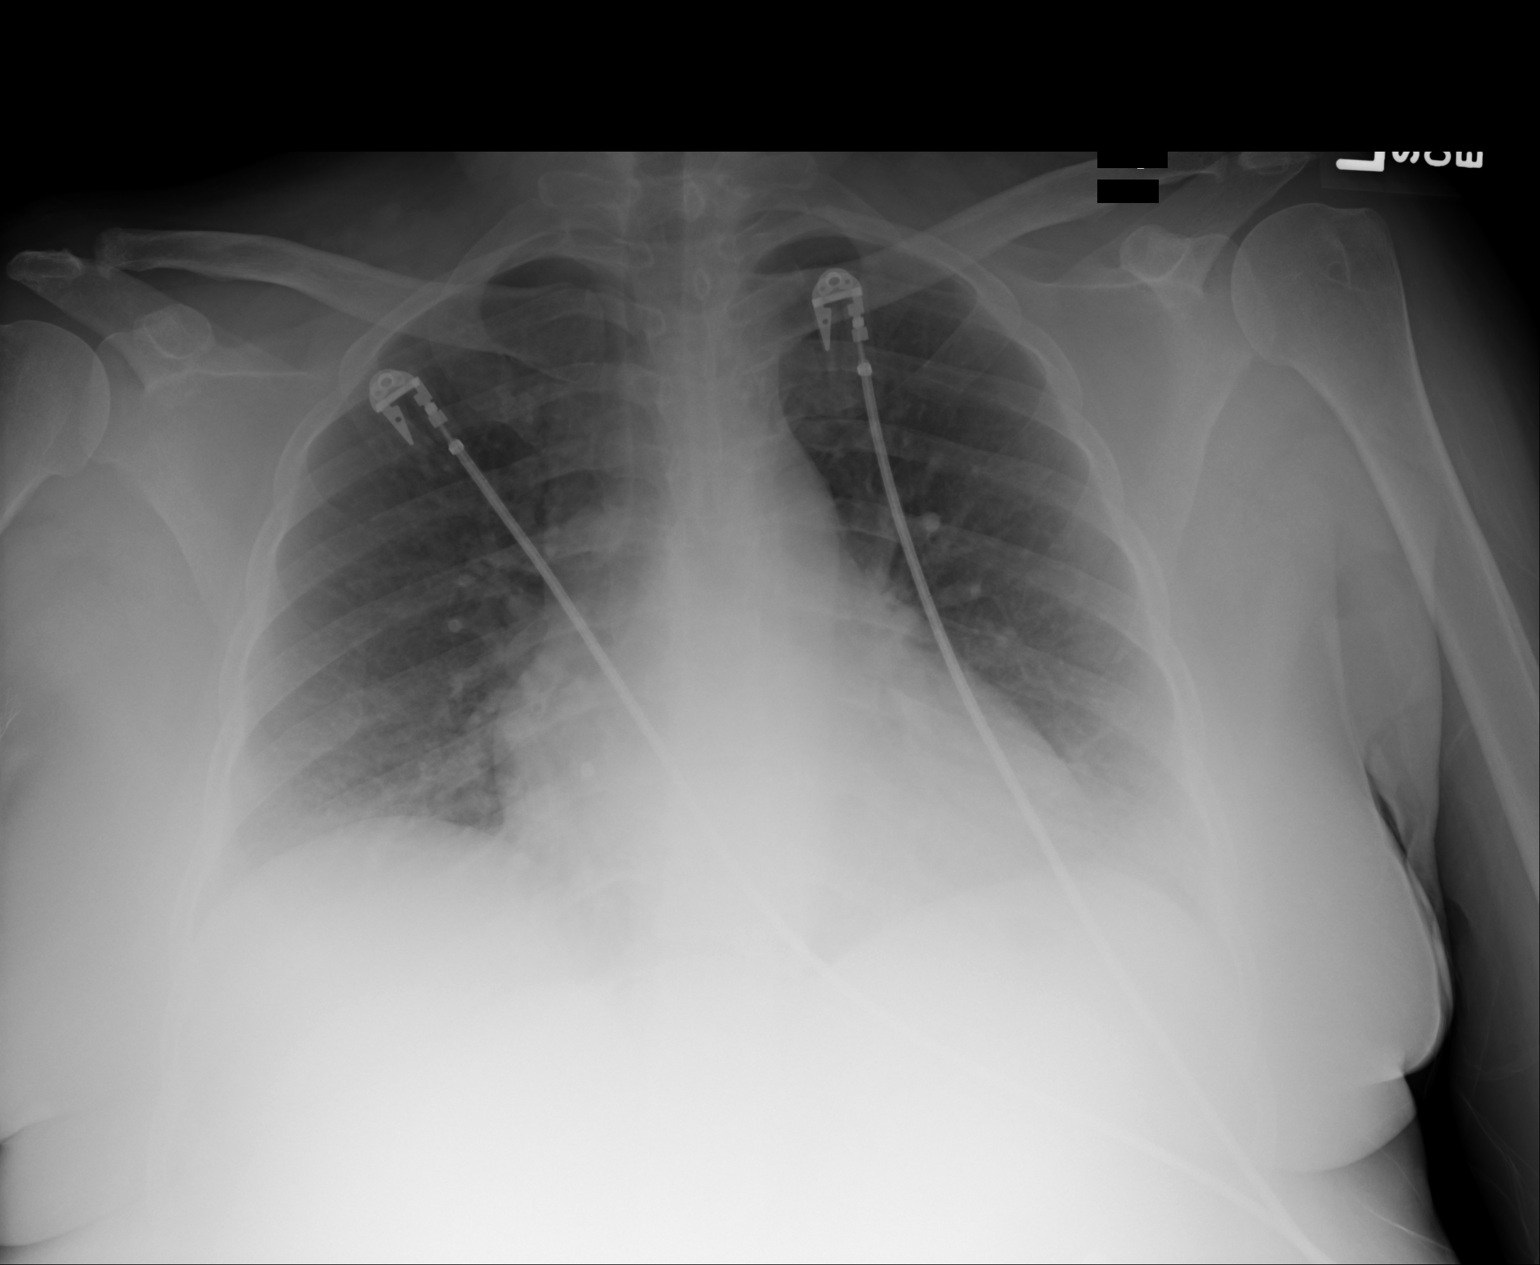

[1 of 1 positions shown; findings below may reference images not displayed]

PROCEDURE:     DXR - DXR PORTABLE CHEST SINGLE VIEW  - November 03, 2012  [DATE]

RESULT:     Comparison is made to the previous examination dated 28 February, 2012. Cardiac monitoring electrodes are present. The cardiac silhouette is
borderline enlarged. The lungs are clear. The pulmonary vessels are normal.
The bony and mediastinal structures are unremarkable. There is no effusion.
There is no pneumothorax or evidence of congestive failure.
IMPRESSION: No acute cardiopulmonary disease.

[REDACTED]

## 2013-03-31 ENCOUNTER — Inpatient Hospital Stay: Payer: Self-pay | Admitting: Internal Medicine

## 2013-03-31 LAB — COMPREHENSIVE METABOLIC PANEL
Albumin: 3.7 g/dL (ref 3.4–5.0)
Alkaline Phosphatase: 92 U/L (ref 50–136)
Anion Gap: 10 (ref 7–16)
BUN: 31 mg/dL — ABNORMAL HIGH (ref 7–18)
Bilirubin,Total: 0.4 mg/dL (ref 0.2–1.0)
Calcium, Total: 9.1 mg/dL (ref 8.5–10.1)
Chloride: 98 mmol/L (ref 98–107)
Co2: 27 mmol/L (ref 21–32)
Creatinine: 7.81 mg/dL — ABNORMAL HIGH (ref 0.60–1.30)
EGFR (African American): 6 — ABNORMAL LOW
EGFR (Non-African Amer.): 5 — ABNORMAL LOW
Glucose: 233 mg/dL — ABNORMAL HIGH (ref 65–99)
Osmolality: 284 (ref 275–301)
Potassium: 4.2 mmol/L (ref 3.5–5.1)
SGOT(AST): 19 U/L (ref 15–37)
SGPT (ALT): 14 U/L (ref 12–78)
Sodium: 135 mmol/L — ABNORMAL LOW (ref 136–145)
Total Protein: 7.4 g/dL (ref 6.4–8.2)

## 2013-03-31 LAB — URINALYSIS, COMPLETE
Bacteria: NONE SEEN
Bilirubin,UR: NEGATIVE
Glucose,UR: >=500 mg/dL (ref 0–75)
Ketone: NEGATIVE
Leukocyte Esterase: NEGATIVE
Nitrite: NEGATIVE
Ph: 9 (ref 4.5–8.0)
Protein: >=500
RBC,UR: 15 /HPF (ref 0–5)
Specific Gravity: 1.007 (ref 1.003–1.030)
Squamous Epithelial: <1
WBC UR: 1 /HPF (ref 0–5)

## 2013-03-31 LAB — TROPONIN I: Troponin-I: <0.02 ng/mL

## 2013-03-31 LAB — CBC
HCT: 39 % (ref 35.0–47.0)
HGB: 12.7 g/dL (ref 12.0–16.0)
MCH: 30.5 pg (ref 26.0–34.0)
MCHC: 32.5 g/dL (ref 32.0–36.0)
MCV: 94 fL (ref 80–100)
Platelet: 180 10*3/uL (ref 150–440)
RBC: 4.16 10*6/uL (ref 3.80–5.20)
RDW: 16.9 % — ABNORMAL HIGH (ref 11.5–14.5)
WBC: 5.9 10*3/uL (ref 3.6–11.0)

## 2013-03-31 LAB — CLOSTRIDIUM DIFFICILE BY PCR

## 2013-03-31 LAB — LIPASE, BLOOD: Lipase: 947 U/L — ABNORMAL HIGH (ref 73–393)

## 2013-04-01 LAB — BASIC METABOLIC PANEL
Anion Gap: 10 (ref 7–16)
BUN: 34 mg/dL — ABNORMAL HIGH (ref 7–18)
Calcium, Total: 8.7 mg/dL (ref 8.5–10.1)
Chloride: 98 mmol/L (ref 98–107)
Co2: 27 mmol/L (ref 21–32)
Creatinine: 8.36 mg/dL — ABNORMAL HIGH (ref 0.60–1.30)
EGFR (African American): 6 — ABNORMAL LOW
EGFR (Non-African Amer.): 5 — ABNORMAL LOW
Glucose: 134 mg/dL — ABNORMAL HIGH (ref 65–99)
Osmolality: 280 (ref 275–301)
Potassium: 4.4 mmol/L (ref 3.5–5.1)
Sodium: 135 mmol/L — ABNORMAL LOW (ref 136–145)

## 2013-04-01 LAB — CBC WITH DIFFERENTIAL/PLATELET
Basophil #: 0 10*3/uL (ref 0.0–0.1)
Basophil %: 0.7 %
Eosinophil #: 0.4 10*3/uL (ref 0.0–0.7)
Eosinophil %: 6.7 %
HCT: 36.4 % (ref 35.0–47.0)
HGB: 11.7 g/dL — ABNORMAL LOW (ref 12.0–16.0)
Lymphocyte #: 2.6 10*3/uL (ref 1.0–3.6)
Lymphocyte %: 44.9 %
MCH: 30.5 pg (ref 26.0–34.0)
MCHC: 32 g/dL (ref 32.0–36.0)
MCV: 95 fL (ref 80–100)
Monocyte #: 0.5 x10 3/mm (ref 0.2–0.9)
Monocyte %: 9.3 %
Neutrophil #: 2.2 10*3/uL (ref 1.4–6.5)
Neutrophil %: 38.4 %
Platelet: 171 10*3/uL (ref 150–440)
RBC: 3.83 10*6/uL (ref 3.80–5.20)
RDW: 16.2 % — ABNORMAL HIGH (ref 11.5–14.5)
WBC: 5.8 10*3/uL (ref 3.6–11.0)

## 2013-04-01 LAB — MAGNESIUM: Magnesium: 2.6 mg/dL — ABNORMAL HIGH

## 2013-04-01 LAB — LIPASE, BLOOD: Lipase: 563 U/L — ABNORMAL HIGH (ref 73–393)

## 2013-04-01 LAB — PHOSPHORUS: Phosphorus: 6.4 mg/dL — ABNORMAL HIGH (ref 2.5–4.9)

## 2013-04-01 LAB — HEMOGLOBIN A1C: Hemoglobin A1C: 7.6 % — ABNORMAL HIGH (ref 4.2–6.3)

## 2013-04-02 LAB — CBC WITH DIFFERENTIAL/PLATELET
Basophil #: 0.1 10*3/uL (ref 0.0–0.1)
Basophil %: 1.8 %
Eosinophil #: 0.4 10*3/uL (ref 0.0–0.7)
Eosinophil %: 8.7 %
HCT: 35.2 % (ref 35.0–47.0)
HGB: 11.3 g/dL — ABNORMAL LOW (ref 12.0–16.0)
Lymphocyte #: 2.2 10*3/uL (ref 1.0–3.6)
Lymphocyte %: 47.1 %
MCH: 30.2 pg (ref 26.0–34.0)
MCHC: 32 g/dL (ref 32.0–36.0)
MCV: 94 fL (ref 80–100)
Monocyte #: 0.5 x10 3/mm (ref 0.2–0.9)
Monocyte %: 11.3 %
Neutrophil #: 1.4 10*3/uL (ref 1.4–6.5)
Neutrophil %: 31.1 %
Platelet: 157 10*3/uL (ref 150–440)
RBC: 3.73 10*6/uL — ABNORMAL LOW (ref 3.80–5.20)
RDW: 16.4 % — ABNORMAL HIGH (ref 11.5–14.5)
WBC: 4.6 10*3/uL (ref 3.6–11.0)

## 2013-04-02 LAB — BASIC METABOLIC PANEL
Anion Gap: 4 — ABNORMAL LOW (ref 7–16)
BUN: 15 mg/dL (ref 7–18)
Calcium, Total: 8.3 mg/dL — ABNORMAL LOW (ref 8.5–10.1)
Chloride: 100 mmol/L (ref 98–107)
Co2: 32 mmol/L (ref 21–32)
Creatinine: 5.24 mg/dL — ABNORMAL HIGH (ref 0.60–1.30)
EGFR (African American): 10 — ABNORMAL LOW
EGFR (Non-African Amer.): 9 — ABNORMAL LOW
Glucose: 103 mg/dL — ABNORMAL HIGH (ref 65–99)
Osmolality: 273 (ref 275–301)
Potassium: 3.8 mmol/L (ref 3.5–5.1)
Sodium: 136 mmol/L (ref 136–145)

## 2013-04-22 ENCOUNTER — Other Ambulatory Visit: Payer: Self-pay | Admitting: Gastroenterology

## 2013-04-22 LAB — LIPASE, BLOOD: Lipase: 1105 U/L — ABNORMAL HIGH (ref 73–393)

## 2013-04-23 ENCOUNTER — Other Ambulatory Visit: Payer: Self-pay | Admitting: Gastroenterology

## 2013-04-23 LAB — CLOSTRIDIUM DIFFICILE BY PCR

## 2013-05-19 ENCOUNTER — Ambulatory Visit: Payer: Self-pay | Admitting: Gastroenterology

## 2013-05-19 LAB — POTASSIUM: Potassium: 4.7 mmol/L (ref 3.5–5.1)

## 2013-05-27 LAB — PATHOLOGY REPORT

## 2013-07-18 ENCOUNTER — Inpatient Hospital Stay: Payer: Self-pay | Admitting: Vascular Surgery

## 2013-07-18 LAB — RENAL FUNCTION PANEL
Albumin: 3.4 g/dL (ref 3.4–5.0)
Anion Gap: 7 (ref 7–16)
BUN: 52 mg/dL — ABNORMAL HIGH (ref 7–18)
Calcium, Total: 8.8 mg/dL (ref 8.5–10.1)
Chloride: 98 mmol/L (ref 98–107)
Co2: 24 mmol/L (ref 21–32)
Creatinine: 9.58 mg/dL — ABNORMAL HIGH (ref 0.60–1.30)
EGFR (African American): 5 — ABNORMAL LOW
EGFR (Non-African Amer.): 4 — ABNORMAL LOW
Glucose: 213 mg/dL — ABNORMAL HIGH (ref 65–99)
Osmolality: 279 (ref 275–301)
Phosphorus: 6.2 mg/dL — ABNORMAL HIGH (ref 2.5–4.9)
Potassium: 6.5 mmol/L (ref 3.5–5.1)
Sodium: 129 mmol/L — ABNORMAL LOW (ref 136–145)

## 2013-07-18 LAB — CBC WITH DIFFERENTIAL/PLATELET
Basophil #: 0.1 10*3/uL (ref 0.0–0.1)
Basophil %: 0.8 %
Eosinophil #: 0.3 10*3/uL (ref 0.0–0.7)
Eosinophil %: 3.8 %
HCT: 31.3 % — ABNORMAL LOW (ref 35.0–47.0)
HGB: 10.3 g/dL — ABNORMAL LOW (ref 12.0–16.0)
Lymphocyte #: 3.2 10*3/uL (ref 1.0–3.6)
Lymphocyte %: 45.5 %
MCH: 30.3 pg (ref 26.0–34.0)
MCHC: 33 g/dL (ref 32.0–36.0)
MCV: 92 fL (ref 80–100)
Monocyte #: 0.7 x10 3/mm (ref 0.2–0.9)
Monocyte %: 10.5 %
Neutrophil #: 2.8 10*3/uL (ref 1.4–6.5)
Neutrophil %: 39.4 %
Platelet: 176 10*3/uL (ref 150–440)
RBC: 3.41 10*6/uL — ABNORMAL LOW (ref 3.80–5.20)
RDW: 13.8 % (ref 11.5–14.5)
WBC: 7 10*3/uL (ref 3.6–11.0)

## 2013-07-19 LAB — BASIC METABOLIC PANEL
Anion Gap: 7 (ref 7–16)
BUN: 23 mg/dL — ABNORMAL HIGH (ref 7–18)
Calcium, Total: 8.4 mg/dL — ABNORMAL LOW (ref 8.5–10.1)
Chloride: 95 mmol/L — ABNORMAL LOW (ref 98–107)
Co2: 31 mmol/L (ref 21–32)
Creatinine: 5.42 mg/dL — ABNORMAL HIGH (ref 0.60–1.30)
EGFR (African American): 10 — ABNORMAL LOW
EGFR (Non-African Amer.): 8 — ABNORMAL LOW
Glucose: 224 mg/dL — ABNORMAL HIGH (ref 65–99)
Osmolality: 277 (ref 275–301)
Potassium: 4.5 mmol/L (ref 3.5–5.1)
Sodium: 133 mmol/L — ABNORMAL LOW (ref 136–145)

## 2013-07-19 LAB — CBC WITH DIFFERENTIAL/PLATELET
Basophil #: 0 10*3/uL (ref 0.0–0.1)
Basophil %: 0.7 %
Eosinophil #: 0.1 10*3/uL (ref 0.0–0.7)
Eosinophil %: 2.2 %
HCT: 27.9 % — ABNORMAL LOW (ref 35.0–47.0)
HGB: 9.4 g/dL — ABNORMAL LOW (ref 12.0–16.0)
Lymphocyte #: 2.8 10*3/uL (ref 1.0–3.6)
Lymphocyte %: 45 %
MCH: 30.7 pg (ref 26.0–34.0)
MCHC: 33.8 g/dL (ref 32.0–36.0)
MCV: 91 fL (ref 80–100)
Monocyte #: 0.4 x10 3/mm (ref 0.2–0.9)
Monocyte %: 7.1 %
Neutrophil #: 2.8 10*3/uL (ref 1.4–6.5)
Neutrophil %: 45 %
Platelet: 148 10*3/uL — ABNORMAL LOW (ref 150–440)
RBC: 3.08 10*6/uL — ABNORMAL LOW (ref 3.80–5.20)
RDW: 13.9 % (ref 11.5–14.5)
WBC: 6.2 10*3/uL (ref 3.6–11.0)

## 2013-07-19 LAB — HEMOGLOBIN A1C: Hemoglobin A1C: 9.3 % — ABNORMAL HIGH (ref 4.2–6.3)

## 2013-07-19 LAB — MAGNESIUM: Magnesium: 1.9 mg/dL

## 2013-07-19 LAB — APTT: Activated PTT: 34.5 secs (ref 23.6–35.9)

## 2013-07-20 LAB — PHOSPHORUS: Phosphorus: 6.2 mg/dL — ABNORMAL HIGH (ref 2.5–4.9)

## 2013-08-04 ENCOUNTER — Emergency Department: Payer: Self-pay | Admitting: Emergency Medicine

## 2013-08-06 IMAGING — CT CT ABD-PELV W/O CM
1 of 2 series · 15 of 32 positions shown, 19 images · non-contrast
Comparison: none

REASON FOR EXAM: (1) epigastric pain. ORAL CONTRAST ONLY; (2) epigastric
pain;    NOTE: Nursing t
COMMENTS:   May transport without cardiac monitor

PROCEDURE:     CT  - CT ABDOMEN AND PELVIS W[DATE]  [DATE]
RESULT:     Intraconal pain.
Comparison Study: Prior CT of 11/02/2012.

[Series 2: 3mm soft tissue · axial · 0.81mm/px · z∈[+268,+686]mm · 15 of 153 slices shown, 19 images]
[im 7/153  soft-tissue]
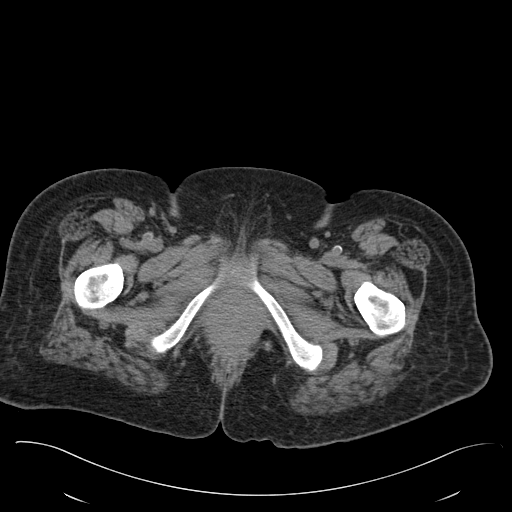
[im 7/153  bone]
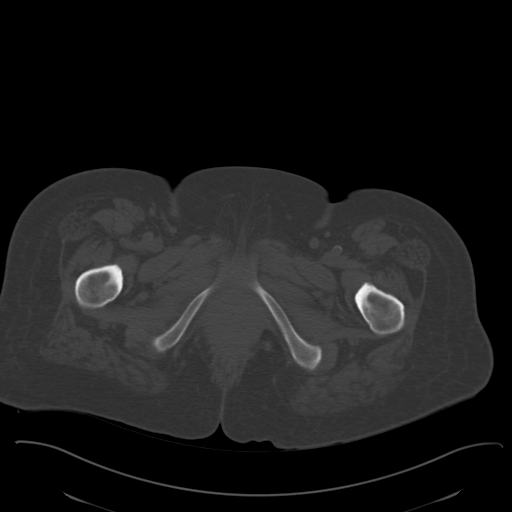
[im 21/153  soft-tissue]
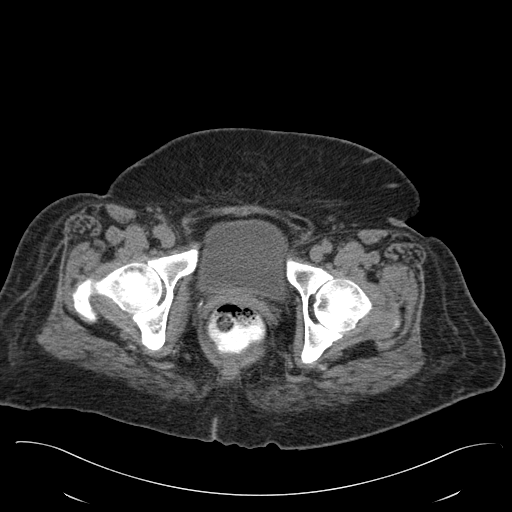
[im 35/153  soft-tissue]
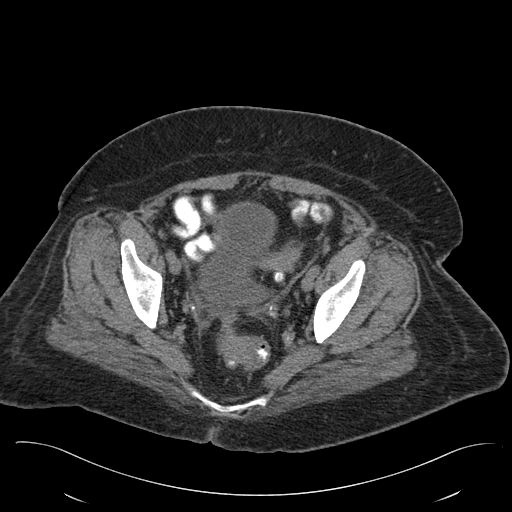
[im 42/153  soft-tissue]
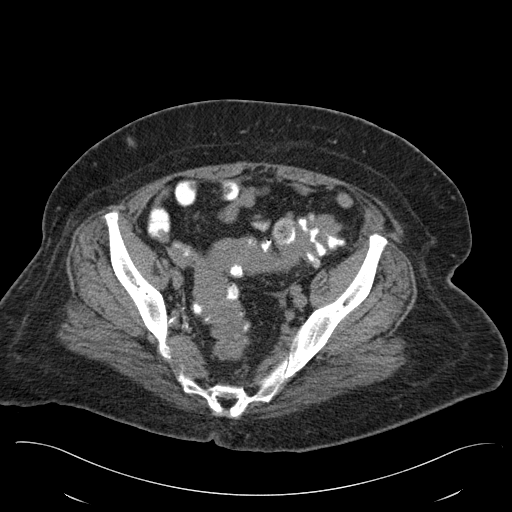
[im 56/153  soft-tissue]
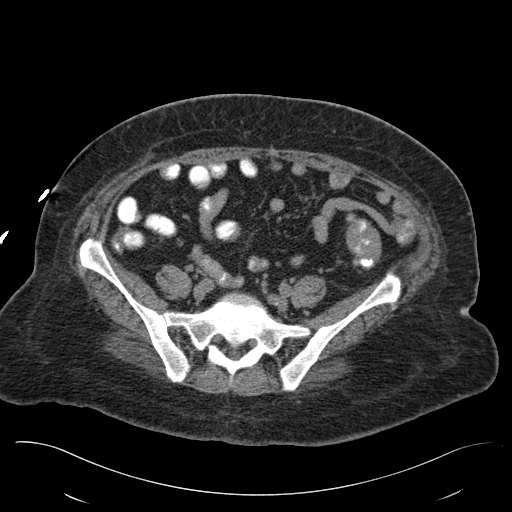
[im 63/153  soft-tissue]
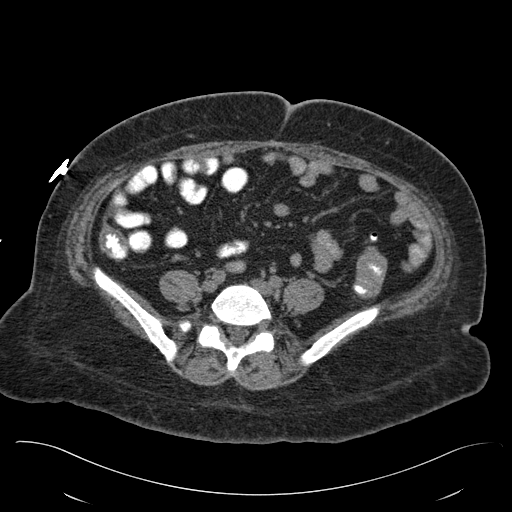
[im 77/153  soft-tissue]
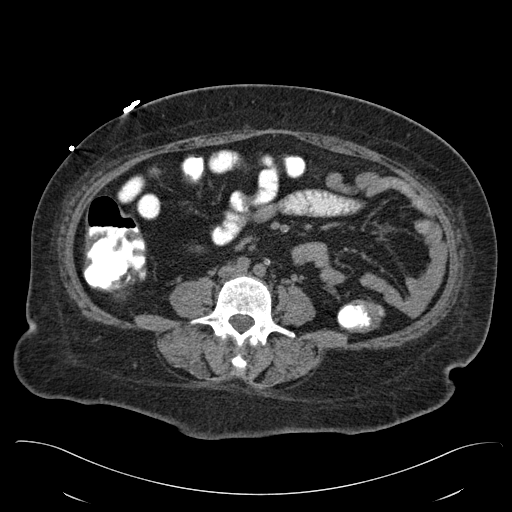
[im 90/153  soft-tissue]
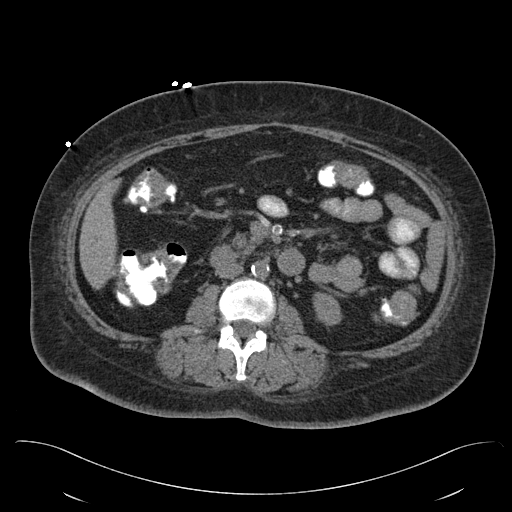
[im 97/153  soft-tissue]
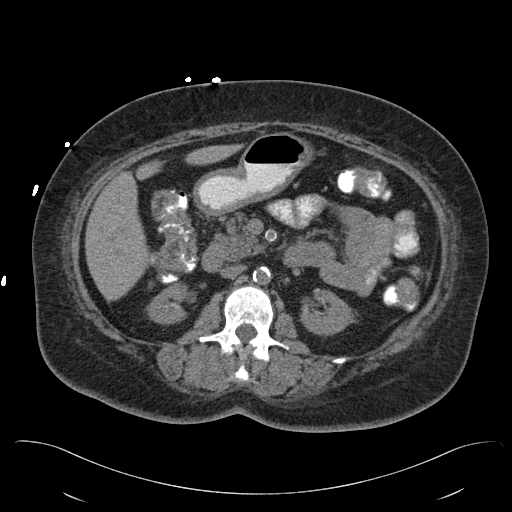
[im 97/153  bone]
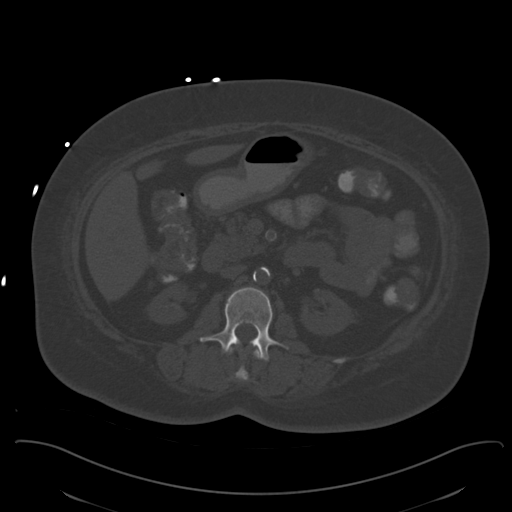
[im 111/153  soft-tissue]
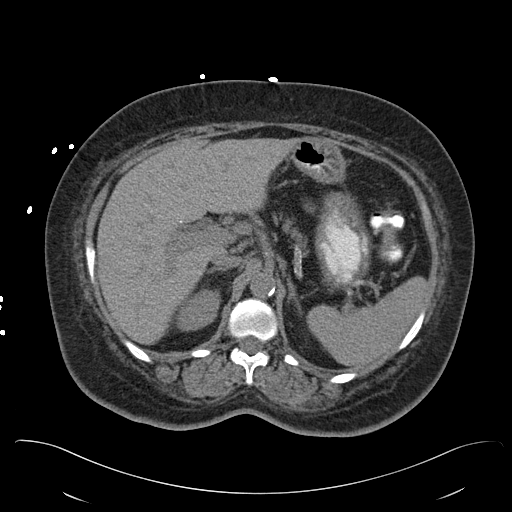
[im 118/153  soft-tissue]
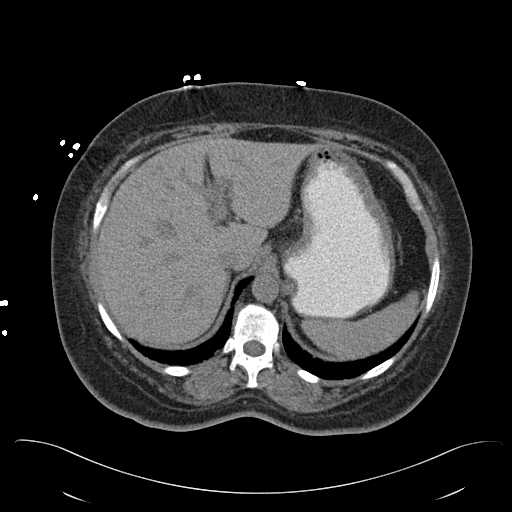
[im 125/153  lung]
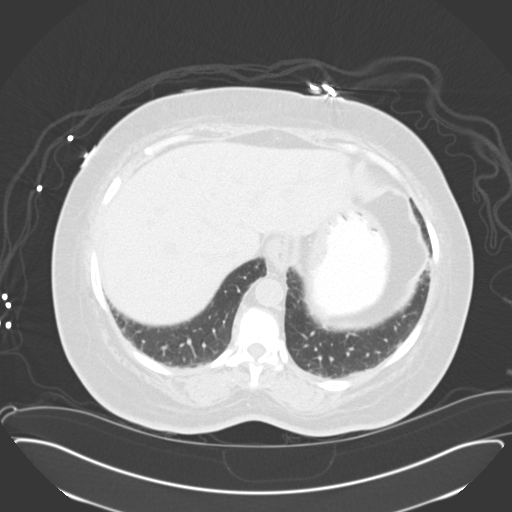
[im 132/153  soft-tissue]
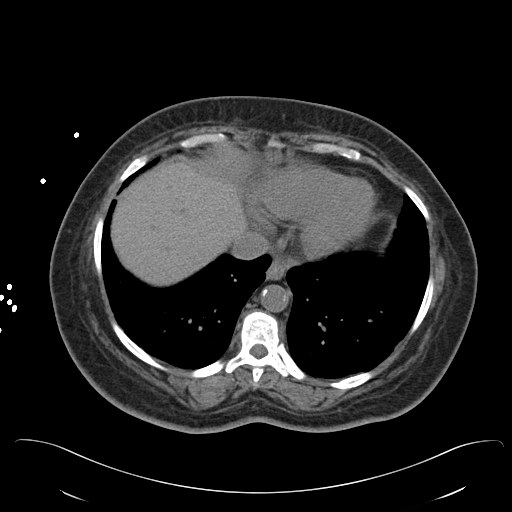
[im 132/153  lung]
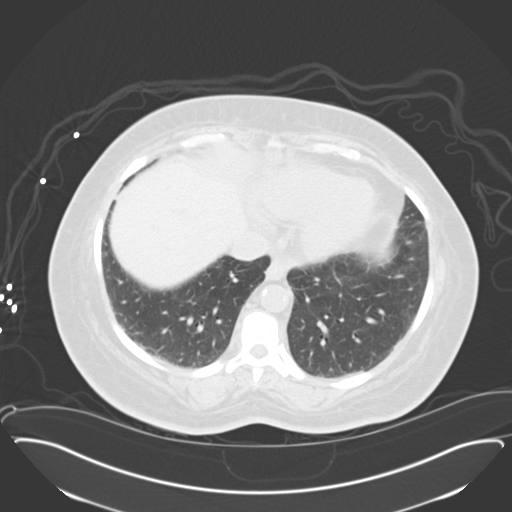
[im 139/153  lung]
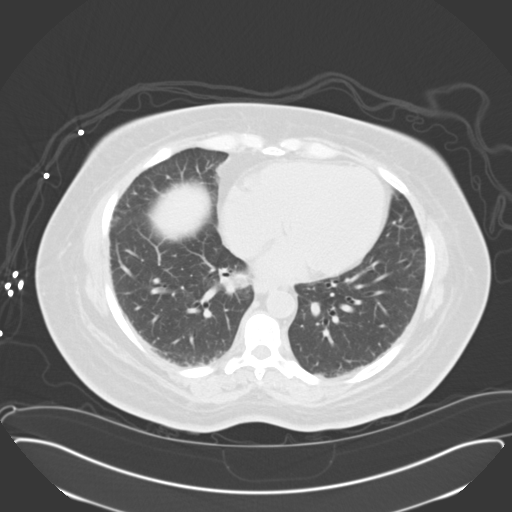
[im 146/153  soft-tissue]
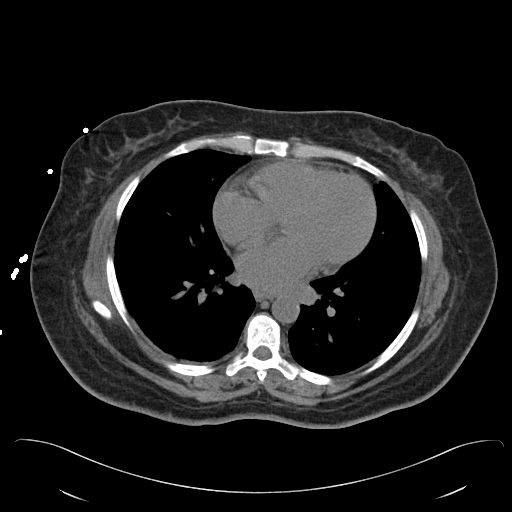
[im 146/153  lung]
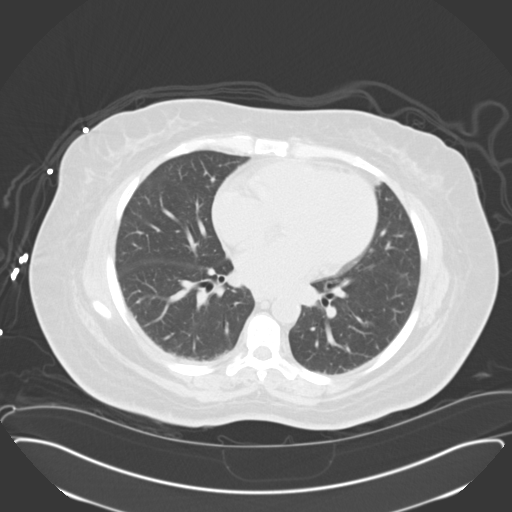

[15 of 32 positions shown; findings below may reference images not displayed]

FINDINGS: Standard nonenhanced exam obtained. Liver normal. Surgical clips
gallbladder fossa. Spleen normal. Pancreas normal. No biliary distention.

Adrenals normal. Simple right renal cyst. Renal atrophy bilaterally. No
hydronephrosis or hydroureter. Bladder normal. Hysterectomy. Adnexa
unremarkable.

 Diffuse colonic wall thickening is present. This is diminished from prior
study of 11/02/2012. Colitis could present in this fashion. There is no
evidence of bowel obstruction. Appendix is not visualized. No free air.
Small hiatal hernia.

Abdominal aorta normal caliber. No adenopathy.

Lung bases clear. No acute bony abnormality.
IMPRESSION: Findings suggesting colitis. These findings have improved
from prior CT of 11/02/2012.

## 2013-08-16 ENCOUNTER — Ambulatory Visit: Payer: Self-pay | Admitting: Vascular Surgery

## 2013-08-16 LAB — BASIC METABOLIC PANEL
Anion Gap: 8 (ref 7–16)
BUN: 22 mg/dL — ABNORMAL HIGH (ref 7–18)
Calcium, Total: 8.8 mg/dL (ref 8.5–10.1)
Chloride: 94 mmol/L — ABNORMAL LOW (ref 98–107)
Co2: 30 mmol/L (ref 21–32)
Creatinine: 6.28 mg/dL — ABNORMAL HIGH (ref 0.60–1.30)
EGFR (African American): 8 — ABNORMAL LOW
EGFR (Non-African Amer.): 7 — ABNORMAL LOW
Glucose: 346 mg/dL — ABNORMAL HIGH (ref 65–99)
Osmolality: 282 (ref 275–301)
Potassium: 5.4 mmol/L — ABNORMAL HIGH (ref 3.5–5.1)
Sodium: 132 mmol/L — ABNORMAL LOW (ref 136–145)

## 2013-08-16 LAB — CBC
HCT: 31.4 % — ABNORMAL LOW (ref 35.0–47.0)
HGB: 10.4 g/dL — ABNORMAL LOW (ref 12.0–16.0)
MCH: 31.1 pg (ref 26.0–34.0)
MCHC: 33.1 g/dL (ref 32.0–36.0)
MCV: 94 fL (ref 80–100)
Platelet: 198 10*3/uL (ref 150–440)
RBC: 3.35 10*6/uL — ABNORMAL LOW (ref 3.80–5.20)
RDW: 14.5 % (ref 11.5–14.5)
WBC: 8 10*3/uL (ref 3.6–11.0)

## 2013-08-16 LAB — URINALYSIS, COMPLETE
Bacteria: NONE SEEN
Bilirubin,UR: NEGATIVE
Blood: NEGATIVE
Glucose,UR: >=500 mg/dL (ref 0–75)
Ketone: NEGATIVE
Leukocyte Esterase: NEGATIVE
Nitrite: NEGATIVE
Ph: 9 (ref 4.5–8.0)
Protein: 100
RBC,UR: 3 /HPF (ref 0–5)
Specific Gravity: 1.005 (ref 1.003–1.030)
Squamous Epithelial: <1
WBC UR: 1 /HPF (ref 0–5)

## 2013-08-24 ENCOUNTER — Ambulatory Visit: Payer: Self-pay | Admitting: Vascular Surgery

## 2013-08-24 ENCOUNTER — Ambulatory Visit: Payer: Self-pay | Admitting: Adult Health

## 2013-09-14 LAB — BASIC METABOLIC PANEL
Anion Gap: 4 — ABNORMAL LOW (ref 7–16)
BUN: 21 mg/dL — ABNORMAL HIGH (ref 7–18)
Calcium, Total: 8.9 mg/dL (ref 8.5–10.1)
Chloride: 97 mmol/L — ABNORMAL LOW (ref 98–107)
Co2: 32 mmol/L (ref 21–32)
Creatinine: 5.63 mg/dL — ABNORMAL HIGH (ref 0.60–1.30)
EGFR (African American): 9 — ABNORMAL LOW
EGFR (Non-African Amer.): 8 — ABNORMAL LOW
Glucose: 178 mg/dL — ABNORMAL HIGH (ref 65–99)
Osmolality: 274 (ref 275–301)
Potassium: 3.9 mmol/L (ref 3.5–5.1)
Sodium: 133 mmol/L — ABNORMAL LOW (ref 136–145)

## 2013-09-14 LAB — CBC
HCT: 31.9 % — ABNORMAL LOW (ref 35.0–47.0)
HGB: 10.1 g/dL — ABNORMAL LOW (ref 12.0–16.0)
MCH: 29.9 pg (ref 26.0–34.0)
MCHC: 31.8 g/dL — ABNORMAL LOW (ref 32.0–36.0)
MCV: 94 fL (ref 80–100)
Platelet: 167 10*3/uL (ref 150–440)
RBC: 3.39 10*6/uL — ABNORMAL LOW (ref 3.80–5.20)
RDW: 14.1 % (ref 11.5–14.5)
WBC: 7.3 10*3/uL (ref 3.6–11.0)

## 2013-09-15 ENCOUNTER — Inpatient Hospital Stay: Payer: Self-pay | Admitting: Vascular Surgery

## 2013-09-16 LAB — RENAL FUNCTION PANEL
Albumin: 2.3 g/dL — ABNORMAL LOW (ref 3.4–5.0)
Anion Gap: 9 (ref 7–16)
BUN: 44 mg/dL — ABNORMAL HIGH (ref 7–18)
Calcium, Total: 8.3 mg/dL — ABNORMAL LOW (ref 8.5–10.1)
Chloride: 93 mmol/L — ABNORMAL LOW (ref 98–107)
Co2: 26 mmol/L (ref 21–32)
Creatinine: 9.26 mg/dL — ABNORMAL HIGH (ref 0.60–1.30)
EGFR (African American): 5 — ABNORMAL LOW
EGFR (Non-African Amer.): 4 — ABNORMAL LOW
Glucose: 207 mg/dL — ABNORMAL HIGH (ref 65–99)
Osmolality: 274 (ref 275–301)
Phosphorus: 4.6 mg/dL (ref 2.5–4.9)
Potassium: 4.5 mmol/L (ref 3.5–5.1)
Sodium: 128 mmol/L — ABNORMAL LOW (ref 136–145)

## 2013-09-22 DIAGNOSIS — Z8719 Personal history of other diseases of the digestive system: Secondary | ICD-10-CM

## 2013-09-22 HISTORY — DX: Personal history of other diseases of the digestive system: Z87.19

## 2013-10-01 ENCOUNTER — Other Ambulatory Visit: Payer: Self-pay | Admitting: Gastroenterology

## 2013-10-01 LAB — CLOSTRIDIUM DIFFICILE(ARMC)

## 2013-10-22 ENCOUNTER — Other Ambulatory Visit: Payer: Self-pay | Admitting: Gastroenterology

## 2013-10-23 LAB — CLOSTRIDIUM DIFFICILE(ARMC)

## 2013-10-25 LAB — STOOL CULTURE

## 2013-10-27 ENCOUNTER — Ambulatory Visit: Payer: Self-pay | Admitting: Physician Assistant

## 2013-11-24 IMAGING — XA IR VASCULAR PROCEDURE
8 series · 14 of 14 positions shown · IV contrast (IODINE)
Comparison: none

[Series 1: forearm · 2 of 2 slices shown (1 of 7)]
[im 1/2]
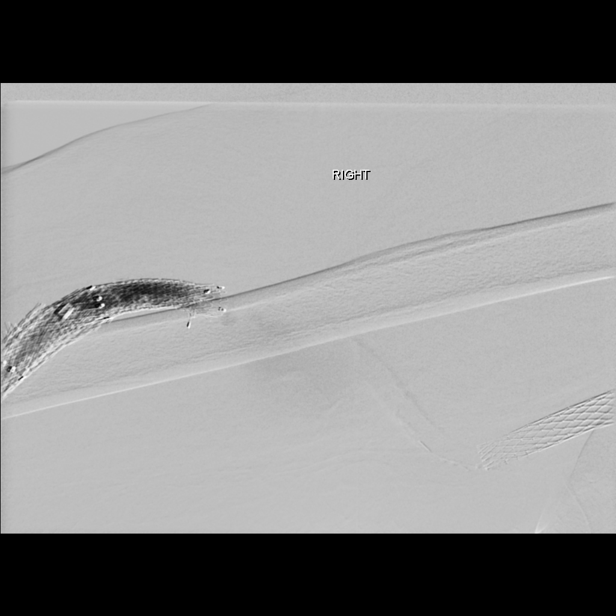
[im 2/2]
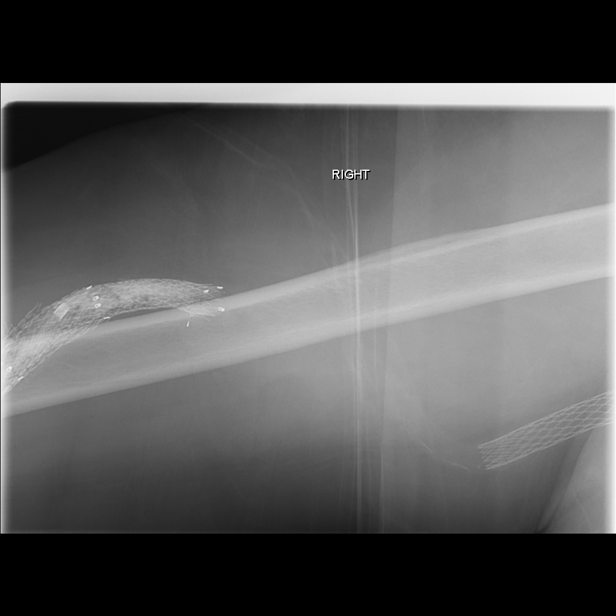

[Series 2: forearm · 1 of 1 slices shown (2 of 7)]
[im 1/1]
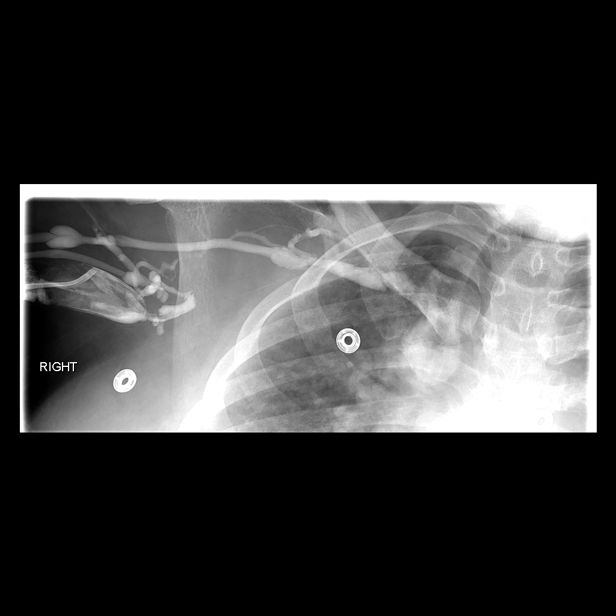

[Series 3: forearm · 2 of 2 slices shown (3 of 7)]
[im 1/2]
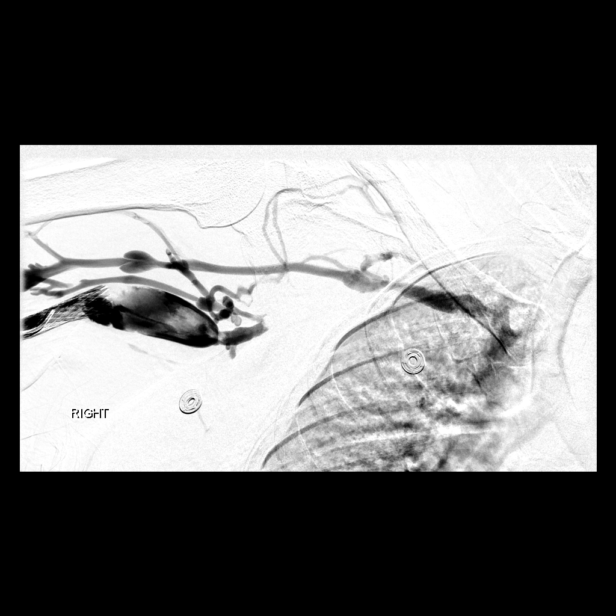
[im 2/2]
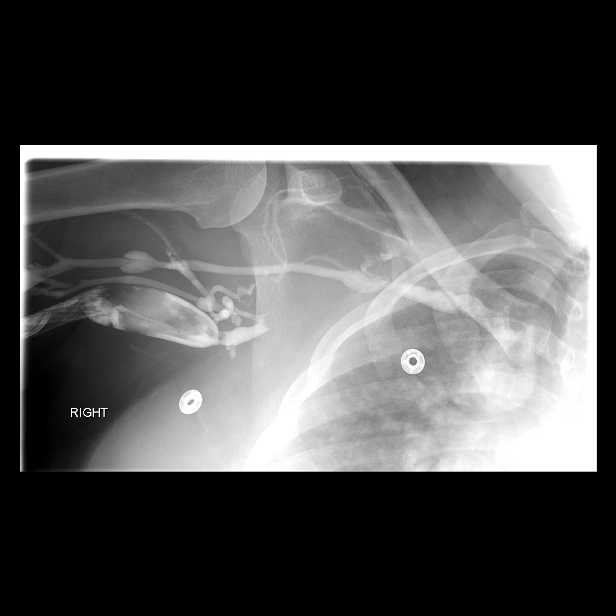

[Series 4: forearm · 2 of 2 slices shown (4 of 7)]
[im 1/2]
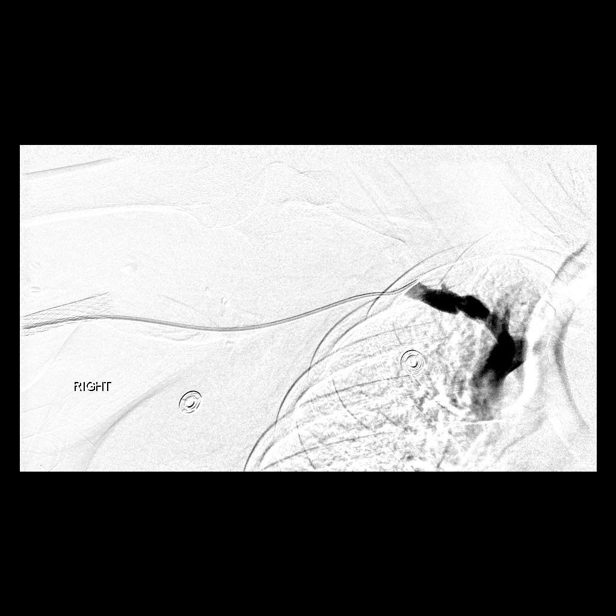
[im 2/2]
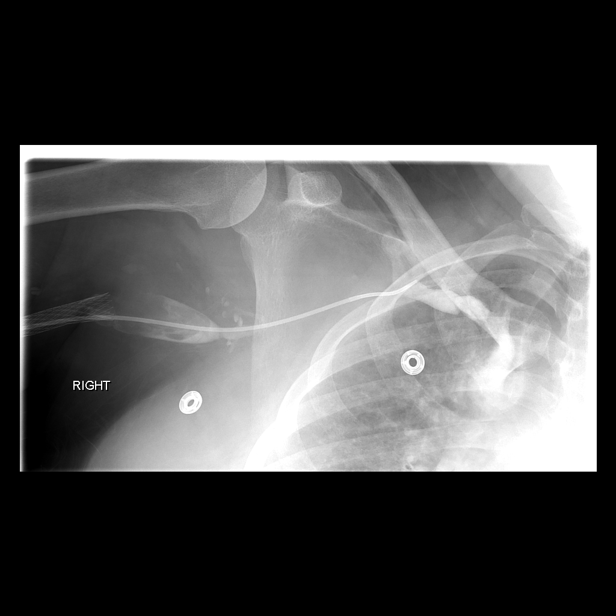

[Series 5: forearm · 2 of 2 slices shown (5 of 7)]
[im 1/2]
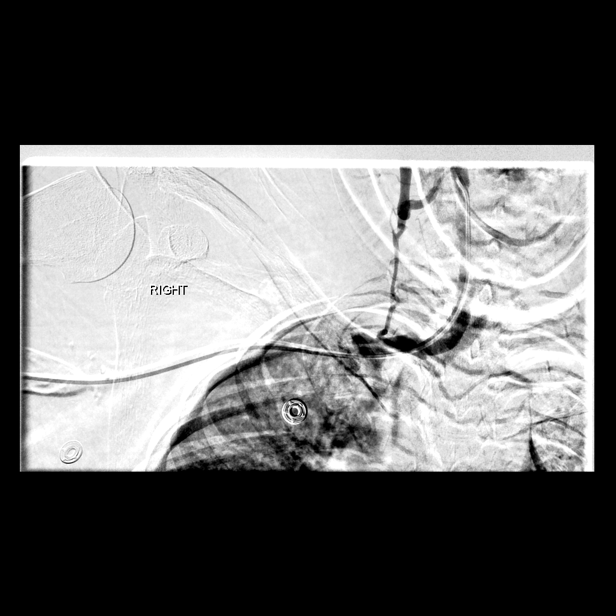
[im 2/2]
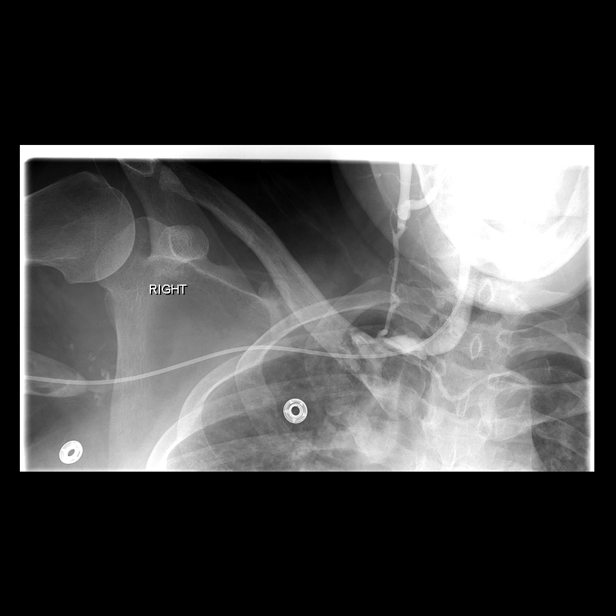

[Series 6: forearm · 2 of 2 slices shown (6 of 7)]
[im 1/2]
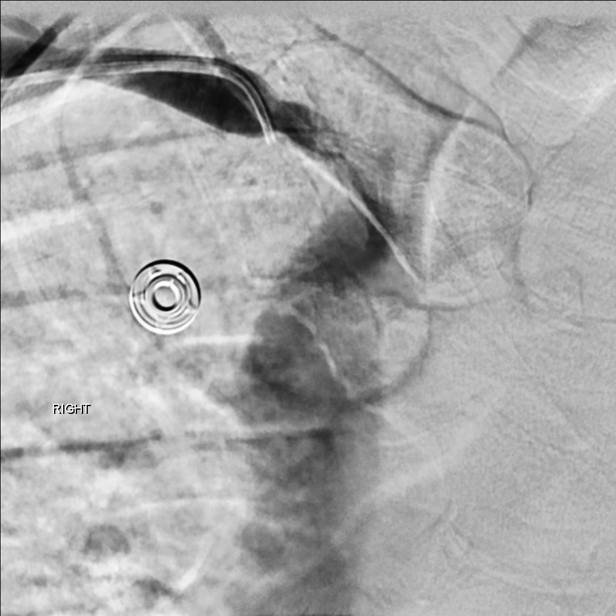
[im 2/2]
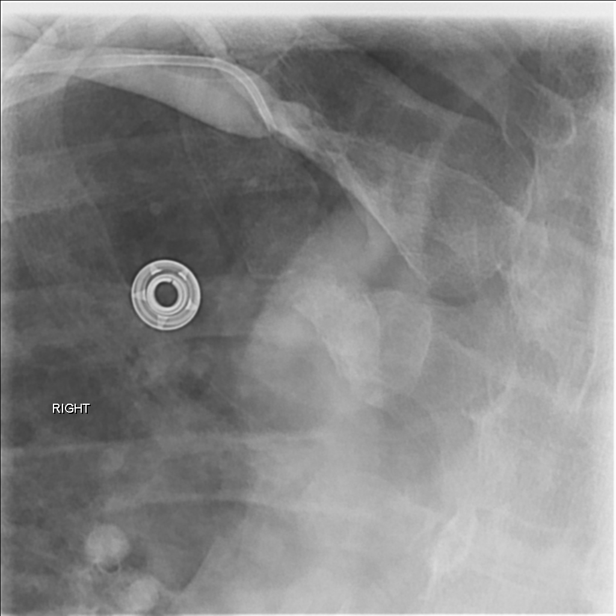

[Series 7: forearm · 2 of 2 slices shown (7 of 7)]
[im 1/2]
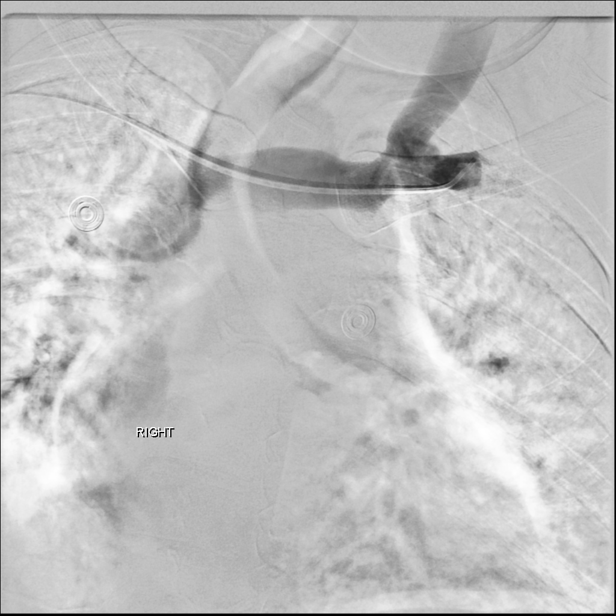
[im 2/2]
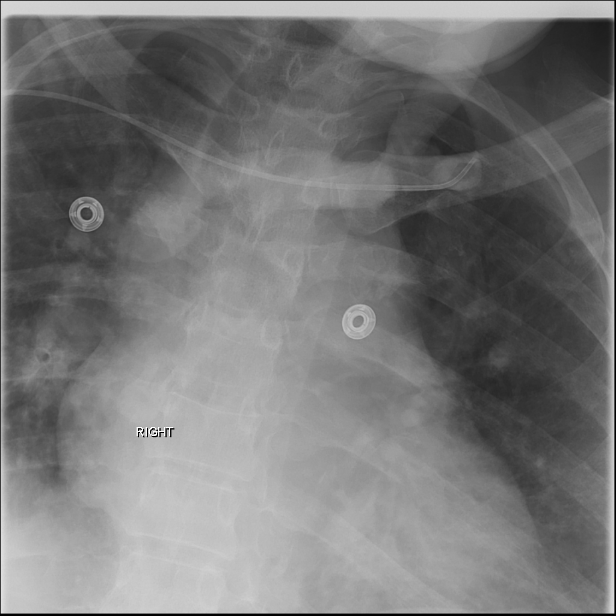

[Series 8: fl - angio · 1 of 1 slices shown]
[im 1/1]
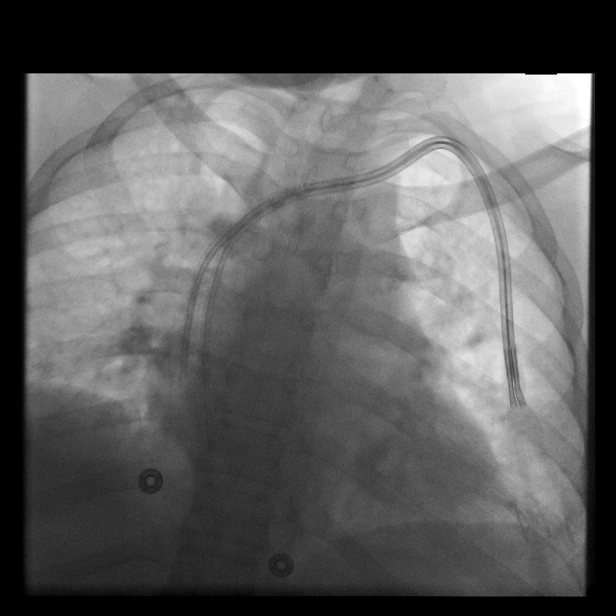

[14 of 14 positions shown; findings below may reference images not displayed]

IMAGES IMPORTED FROM THE [employer] SYSTEM
NO DICTATION FOR STUDY

## 2013-12-02 ENCOUNTER — Observation Stay: Payer: Self-pay | Admitting: Internal Medicine

## 2013-12-02 LAB — TROPONIN I
Troponin-I: <0.02 ng/mL
Troponin-I: <0.02 ng/mL
Troponin-I: <0.02 ng/mL

## 2013-12-02 LAB — COMPREHENSIVE METABOLIC PANEL
Albumin: 3.3 g/dL — ABNORMAL LOW (ref 3.4–5.0)
Alkaline Phosphatase: 82 U/L
Anion Gap: 7 (ref 7–16)
BUN: 34 mg/dL — ABNORMAL HIGH (ref 7–18)
Bilirubin,Total: 0.4 mg/dL (ref 0.2–1.0)
Calcium, Total: 8.7 mg/dL (ref 8.5–10.1)
Chloride: 94 mmol/L — ABNORMAL LOW (ref 98–107)
Co2: 31 mmol/L (ref 21–32)
Creatinine: 7.14 mg/dL — ABNORMAL HIGH (ref 0.60–1.30)
EGFR (African American): 7 — ABNORMAL LOW
EGFR (Non-African Amer.): 6 — ABNORMAL LOW
Glucose: 195 mg/dL — ABNORMAL HIGH (ref 65–99)
Osmolality: 277 (ref 275–301)
Potassium: 4.5 mmol/L (ref 3.5–5.1)
SGOT(AST): 14 U/L — ABNORMAL LOW (ref 15–37)
SGPT (ALT): 13 U/L (ref 12–78)
Sodium: 132 mmol/L — ABNORMAL LOW (ref 136–145)
Total Protein: 7.4 g/dL (ref 6.4–8.2)

## 2013-12-02 LAB — CBC
HCT: 33.1 % — ABNORMAL LOW (ref 35.0–47.0)
HGB: 10.9 g/dL — ABNORMAL LOW (ref 12.0–16.0)
MCH: 29.6 pg (ref 26.0–34.0)
MCHC: 32.9 g/dL (ref 32.0–36.0)
MCV: 90 fL (ref 80–100)
Platelet: 156 10*3/uL (ref 150–440)
RBC: 3.67 10*6/uL — ABNORMAL LOW (ref 3.80–5.20)
RDW: 14.4 % (ref 11.5–14.5)
WBC: 6.5 10*3/uL (ref 3.6–11.0)

## 2013-12-02 LAB — CK TOTAL AND CKMB (NOT AT ARMC)
CK, Total: 40 U/L
CK-MB: 0.9 ng/mL (ref 0.5–3.6)

## 2013-12-02 LAB — LIPASE, BLOOD: Lipase: 555 U/L — ABNORMAL HIGH (ref 73–393)

## 2013-12-10 IMAGING — CR DG CHEST 1V
1 series · 1 of 1 positions shown · non-contrast
Comparison: 11/03/2012 and prior chest radiographs dating back to
12/03/2007

CLINICAL DATA: Bleeding around central venous catheter.

EXAM:
CHEST - 1 VIEW

[pa]
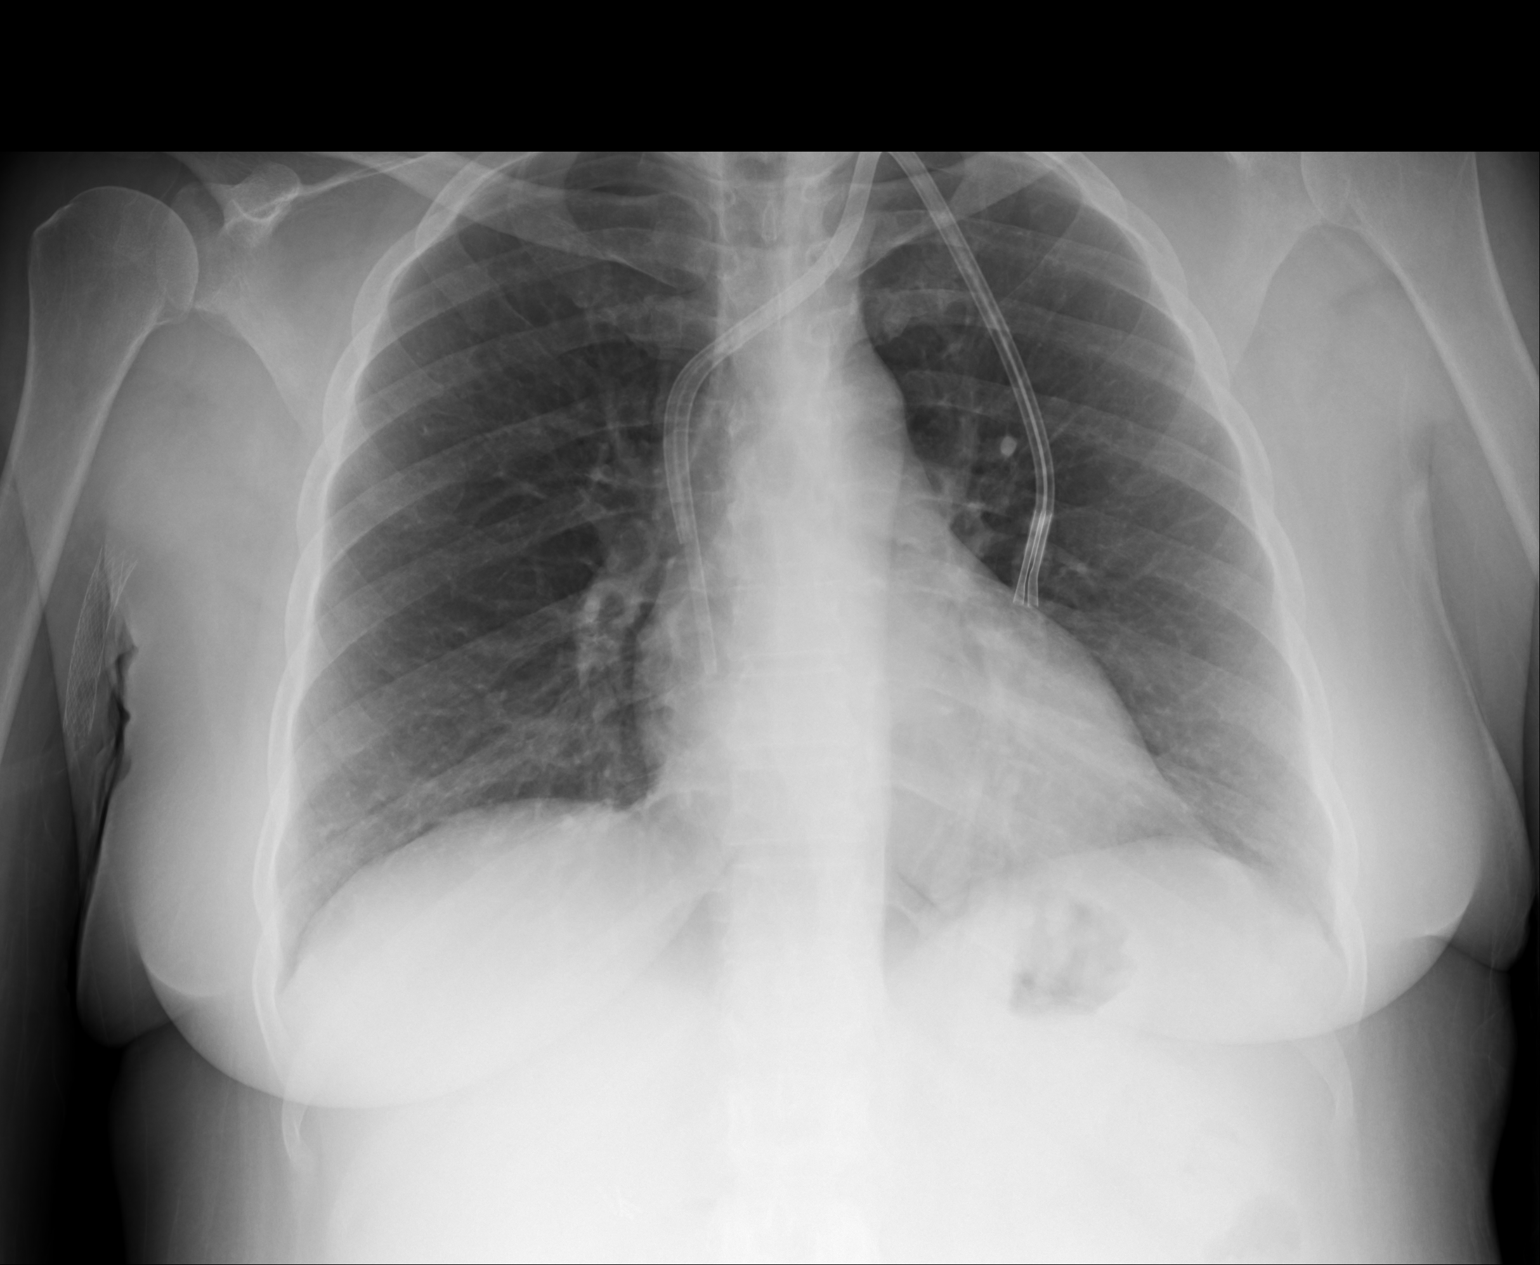

[1 of 1 positions shown; findings below may reference images not displayed]

FINDINGS: The cardiomediastinal silhouette is unremarkable.

A left IJ central venous catheter is identified with tips overlying
the lower SVC and cavoatrial junction.

There is no evidence of focal airspace disease, pulmonary edema,
suspicious pulmonary nodule/mass, pleural effusion, or pneumothorax.
No acute bony abnormalities are identified.
IMPRESSION: No evidence of active cardiopulmonary disease.

Left central venous catheter as described. No radiographic
complicating features noted.

## 2013-12-13 ENCOUNTER — Ambulatory Visit: Payer: Self-pay | Admitting: Vascular Surgery

## 2013-12-13 LAB — BASIC METABOLIC PANEL
Anion Gap: 1 — ABNORMAL LOW (ref 7–16)
BUN: 30 mg/dL — ABNORMAL HIGH (ref 7–18)
Calcium, Total: 8.8 mg/dL (ref 8.5–10.1)
Chloride: 96 mmol/L — ABNORMAL LOW (ref 98–107)
Co2: 35 mmol/L — ABNORMAL HIGH (ref 21–32)
Creatinine: 5.49 mg/dL — ABNORMAL HIGH (ref 0.60–1.30)
EGFR (African American): 9 — ABNORMAL LOW
EGFR (Non-African Amer.): 8 — ABNORMAL LOW
Glucose: 96 mg/dL (ref 65–99)
Osmolality: 271 (ref 275–301)
Potassium: 5.5 mmol/L — ABNORMAL HIGH (ref 3.5–5.1)
Sodium: 132 mmol/L — ABNORMAL LOW (ref 136–145)

## 2014-04-04 ENCOUNTER — Ambulatory Visit: Payer: Self-pay | Admitting: Internal Medicine

## 2014-04-07 ENCOUNTER — Ambulatory Visit: Payer: Self-pay | Admitting: Vascular Surgery

## 2014-04-09 IMAGING — CR DG CHEST 1V PORT
1 series · 1 of 1 positions shown · non-contrast
Comparison: DG CHEST 1V dated 08/04/2013

CLINICAL DATA: Chest pain

EXAM:
PORTABLE CHEST - 1 VIEW

[ap]
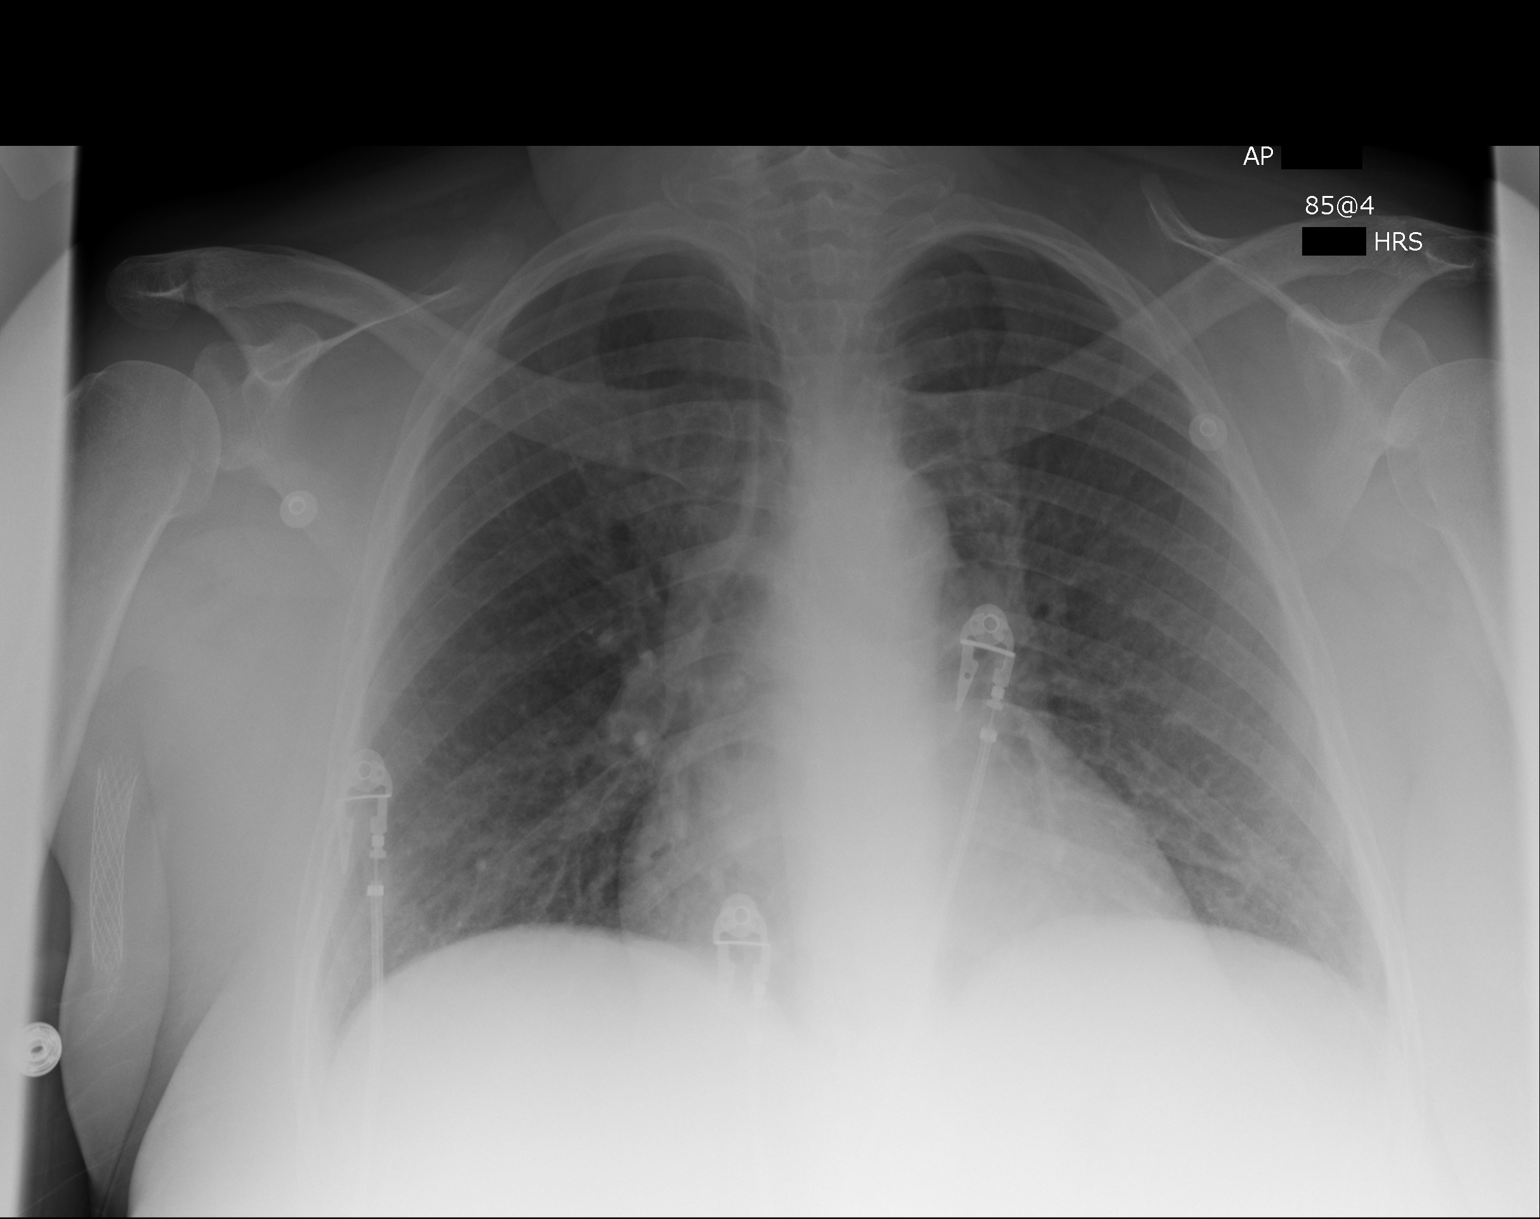

[1 of 1 positions shown; findings below may reference images not displayed]

FINDINGS: The lungs are adequately inflated. There is no focal infiltrate. The
cardiac silhouette is top-normal in size. The pulmonary vascularity
is not clearly engorged. There is no pleural effusion. There is no
pneumothorax or pneumomediastinum. The observed portions of the bony
thorax appear normal.
IMPRESSION: There is no evidence of active cardiopulmonary disease.

## 2014-04-10 IMAGING — CR DG CHEST 2V
1 series · 2 of 2 positions shown · non-contrast
Comparison: 12/02/2013

CLINICAL DATA: Pneumonia/congestion

EXAM:
CHEST  2 VIEW

[Series 1: x chest ap · 0.14mm/px · 2 of 2 slices shown]
[im 1/2]
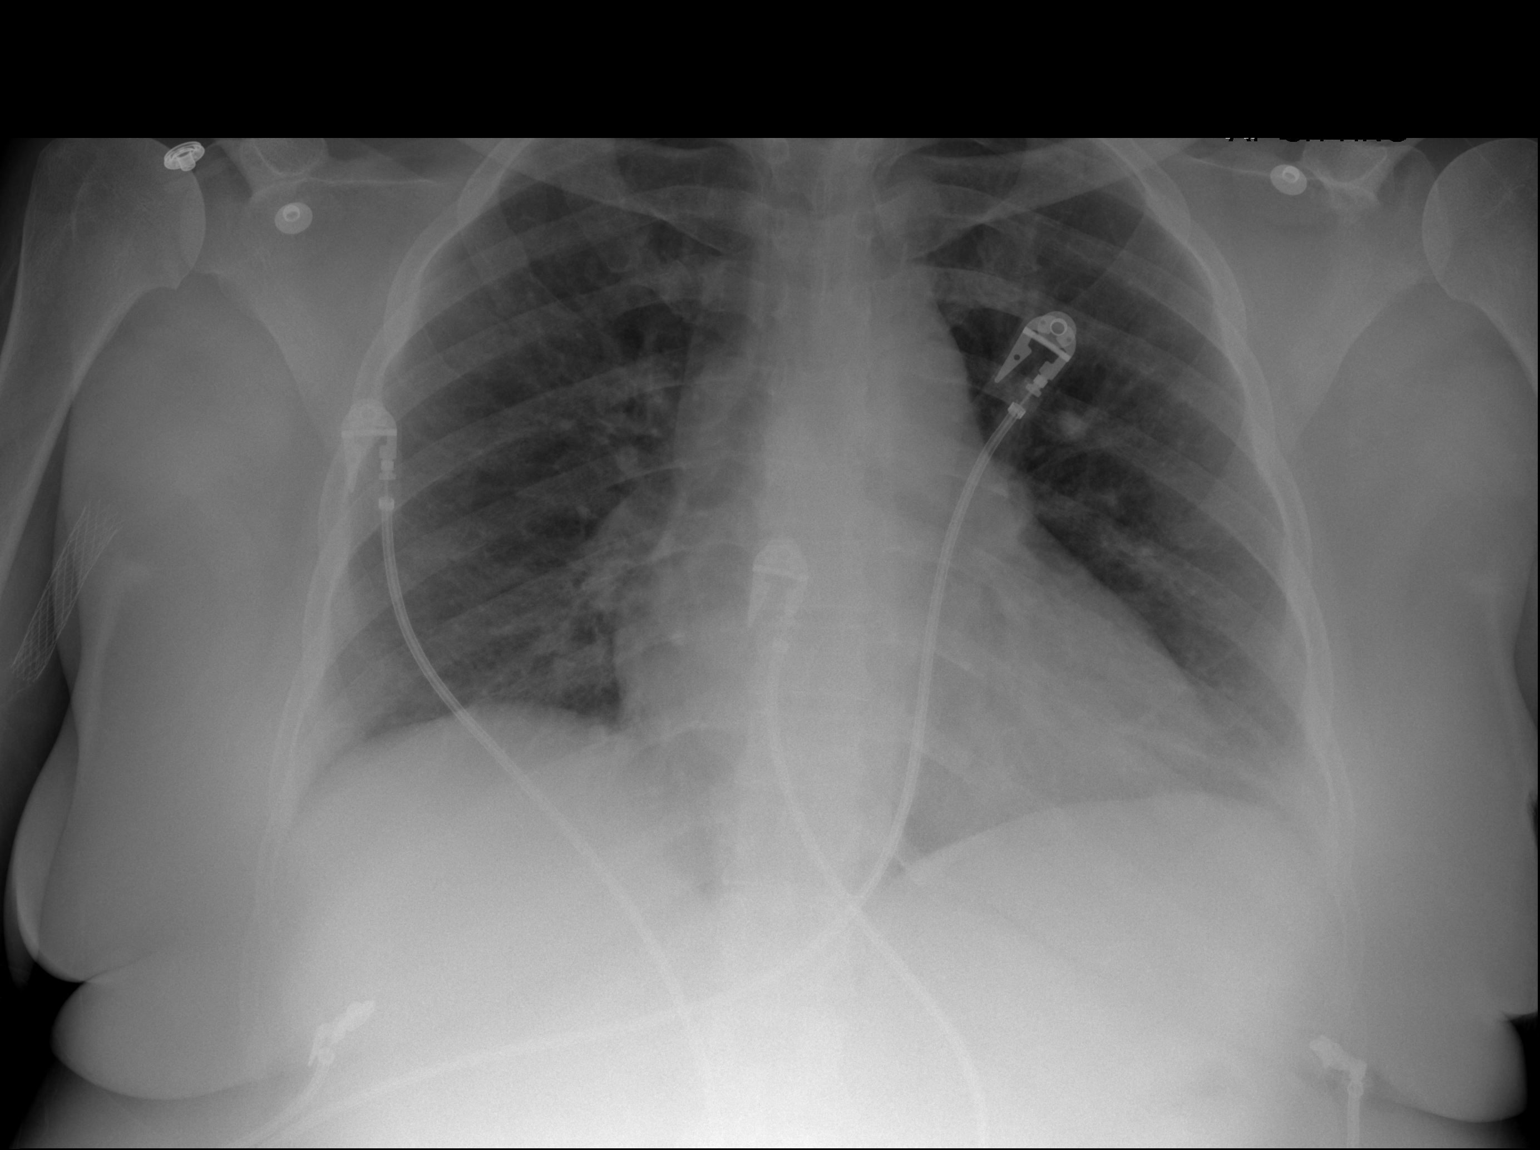
[im 2/2]
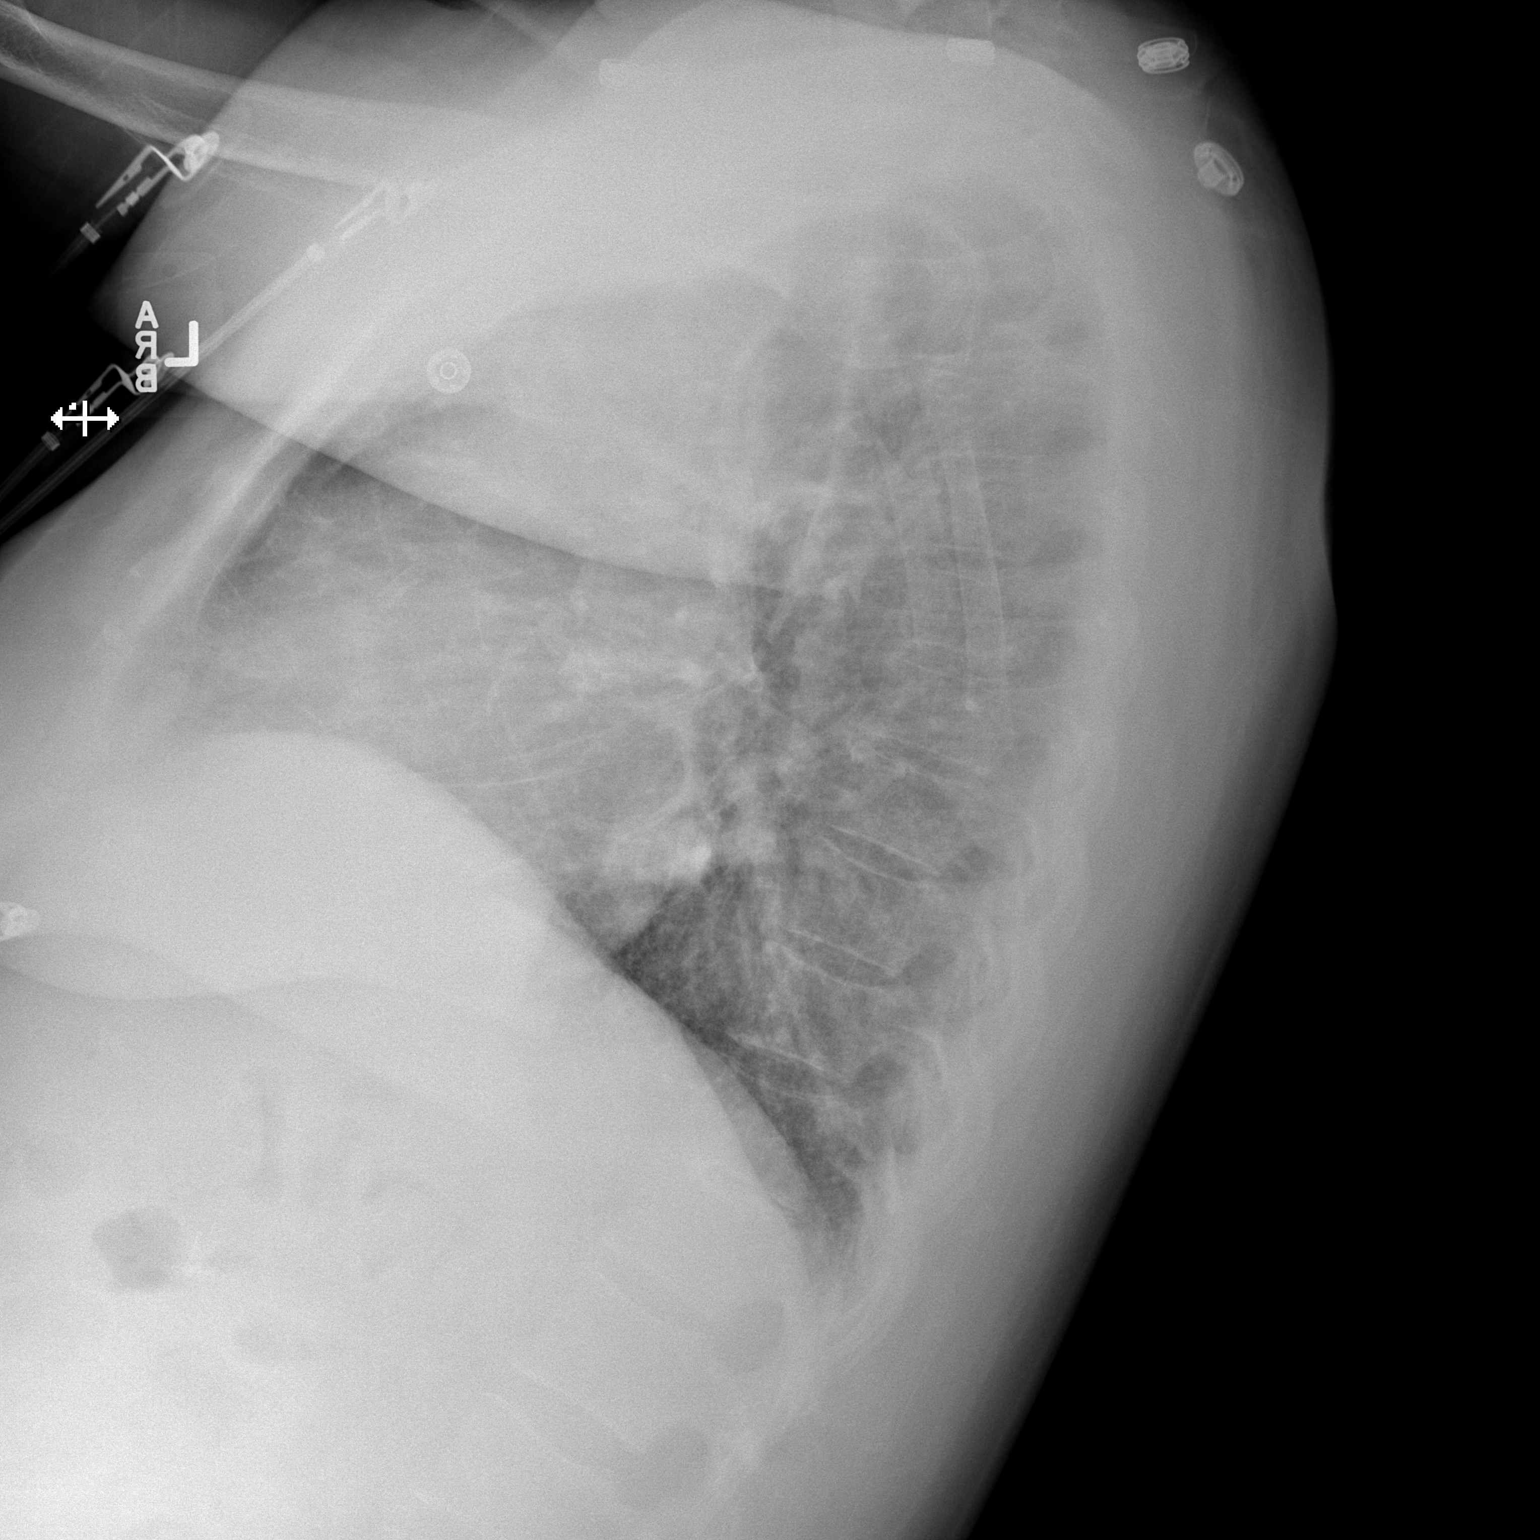

[2 of 2 positions shown; findings below may reference images not displayed]

FINDINGS: Cardiac leads project over the chest. Heart size is upper normal and
stable. Pulmonary vascularity is within normal limits. There is
streaky atelectasis at the lateral left lung base. Otherwise, the
lungs are clear. No airspace disease, pleural effusion, or
pneumothorax. No acute osseous abnormality identified.
IMPRESSION: Streaky atelectasis at the left lung base, otherwise no acute
cardiopulmonary disease.

## 2014-05-30 ENCOUNTER — Inpatient Hospital Stay: Payer: Self-pay | Admitting: Internal Medicine

## 2014-05-30 LAB — CBC WITH DIFFERENTIAL/PLATELET
Basophil #: 0 10*3/uL (ref 0.0–0.1)
Basophil %: 0.6 %
Eosinophil #: 0.3 10*3/uL (ref 0.0–0.7)
Eosinophil %: 3.9 %
HCT: 35.8 % (ref 35.0–47.0)
HGB: 11.1 g/dL — ABNORMAL LOW (ref 12.0–16.0)
Lymphocyte #: 2.8 10*3/uL (ref 1.0–3.6)
Lymphocyte %: 40.5 %
MCH: 30.2 pg (ref 26.0–34.0)
MCHC: 31.1 g/dL — ABNORMAL LOW (ref 32.0–36.0)
MCV: 97 fL (ref 80–100)
Monocyte #: 0.5 x10 3/mm (ref 0.2–0.9)
Monocyte %: 7.7 %
Neutrophil #: 3.3 10*3/uL (ref 1.4–6.5)
Neutrophil %: 47.3 %
Platelet: 149 10*3/uL — ABNORMAL LOW (ref 150–440)
RBC: 3.69 10*6/uL — ABNORMAL LOW (ref 3.80–5.20)
RDW: 13.9 % (ref 11.5–14.5)
WBC: 6.9 10*3/uL (ref 3.6–11.0)

## 2014-05-30 LAB — COMPREHENSIVE METABOLIC PANEL
Albumin: 3.6 g/dL (ref 3.4–5.0)
Alkaline Phosphatase: 85 U/L
Anion Gap: 10 (ref 7–16)
BUN: 57 mg/dL — ABNORMAL HIGH (ref 7–18)
Bilirubin,Total: 0.3 mg/dL (ref 0.2–1.0)
Calcium, Total: 8.5 mg/dL (ref 8.5–10.1)
Chloride: 93 mmol/L — ABNORMAL LOW (ref 98–107)
Co2: 21 mmol/L (ref 21–32)
Creatinine: 11.56 mg/dL — ABNORMAL HIGH (ref 0.60–1.30)
EGFR (African American): 4 — ABNORMAL LOW
EGFR (Non-African Amer.): 3 — ABNORMAL LOW
Glucose: 330 mg/dL — ABNORMAL HIGH (ref 65–99)
Osmolality: 278 (ref 275–301)
Potassium: 8.2 mmol/L (ref 3.5–5.1)
SGOT(AST): 35 U/L (ref 15–37)
SGPT (ALT): 24 U/L
Sodium: 124 mmol/L — ABNORMAL LOW (ref 136–145)
Total Protein: 7.6 g/dL (ref 6.4–8.2)

## 2014-05-30 LAB — LIPASE, BLOOD: Lipase: 1150 U/L — ABNORMAL HIGH (ref 73–393)

## 2014-05-30 LAB — TROPONIN I: Troponin-I: <0.02 ng/mL

## 2014-05-31 LAB — BASIC METABOLIC PANEL
Anion Gap: 7 (ref 7–16)
BUN: 36 mg/dL — ABNORMAL HIGH (ref 7–18)
Calcium, Total: 9 mg/dL (ref 8.5–10.1)
Chloride: 98 mmol/L (ref 98–107)
Co2: 29 mmol/L (ref 21–32)
Creatinine: 7.42 mg/dL — ABNORMAL HIGH (ref 0.60–1.30)
EGFR (African American): 7 — ABNORMAL LOW
EGFR (Non-African Amer.): 6 — ABNORMAL LOW
Glucose: 108 mg/dL — ABNORMAL HIGH (ref 65–99)
Osmolality: 277 (ref 275–301)
Potassium: 4.7 mmol/L (ref 3.5–5.1)
Sodium: 134 mmol/L — ABNORMAL LOW (ref 136–145)

## 2014-05-31 LAB — URINALYSIS, COMPLETE
Bilirubin,UR: NEGATIVE
Blood: NEGATIVE
Glucose,UR: >=500 mg/dL (ref 0–75)
Ketone: NEGATIVE
Nitrite: NEGATIVE
Ph: 8 (ref 4.5–8.0)
Protein: 100
RBC,UR: 6 /HPF (ref 0–5)
Specific Gravity: 1.007 (ref 1.003–1.030)
Squamous Epithelial: 19
WBC UR: 3 /HPF (ref 0–5)

## 2014-05-31 LAB — CBC WITH DIFFERENTIAL/PLATELET
Basophil #: 0 10*3/uL (ref 0.0–0.1)
Basophil %: 0.4 %
Eosinophil #: 0.2 10*3/uL (ref 0.0–0.7)
Eosinophil %: 4.6 %
HCT: 33.4 % — ABNORMAL LOW (ref 35.0–47.0)
HGB: 10.6 g/dL — ABNORMAL LOW (ref 12.0–16.0)
Lymphocyte #: 2.5 10*3/uL (ref 1.0–3.6)
Lymphocyte %: 51.6 %
MCH: 30.2 pg (ref 26.0–34.0)
MCHC: 31.8 g/dL — ABNORMAL LOW (ref 32.0–36.0)
MCV: 95 fL (ref 80–100)
Monocyte #: 0.5 x10 3/mm (ref 0.2–0.9)
Monocyte %: 9.6 %
Neutrophil #: 1.7 10*3/uL (ref 1.4–6.5)
Neutrophil %: 33.8 %
Platelet: 140 10*3/uL — ABNORMAL LOW (ref 150–440)
RBC: 3.52 10*6/uL — ABNORMAL LOW (ref 3.80–5.20)
RDW: 13.6 % (ref 11.5–14.5)
WBC: 4.9 10*3/uL (ref 3.6–11.0)

## 2014-05-31 LAB — LIPID PANEL
Cholesterol: 85 mg/dL (ref 0–200)
HDL Cholesterol: 43 mg/dL (ref 40–60)
Ldl Cholesterol, Calc: 21 mg/dL (ref 0–100)
Triglycerides: 104 mg/dL (ref 0–200)
VLDL Cholesterol, Calc: 21 mg/dL (ref 5–40)

## 2014-05-31 LAB — LIPASE, BLOOD: Lipase: 297 U/L (ref 73–393)

## 2014-05-31 LAB — POTASSIUM: Potassium: 3.8 mmol/L (ref 3.5–5.1)

## 2014-08-01 DIAGNOSIS — R197 Diarrhea, unspecified: Secondary | ICD-10-CM | POA: Insufficient documentation

## 2014-08-04 ENCOUNTER — Emergency Department: Payer: Self-pay | Admitting: Emergency Medicine

## 2014-08-04 ENCOUNTER — Ambulatory Visit: Payer: Self-pay | Admitting: Gastroenterology

## 2014-08-04 ENCOUNTER — Ambulatory Visit: Payer: Self-pay | Admitting: Emergency Medicine

## 2014-08-04 LAB — CBC
HCT: 34.1 % — ABNORMAL LOW (ref 35.0–47.0)
HGB: 10.7 g/dL — ABNORMAL LOW (ref 12.0–16.0)
MCH: 30.4 pg (ref 26.0–34.0)
MCHC: 31.3 g/dL — ABNORMAL LOW (ref 32.0–36.0)
MCV: 97 fL (ref 80–100)
Platelet: 184 10*3/uL (ref 150–440)
RBC: 3.51 10*6/uL — ABNORMAL LOW (ref 3.80–5.20)
RDW: 16.2 % — ABNORMAL HIGH (ref 11.5–14.5)
WBC: 7.3 10*3/uL (ref 3.6–11.0)

## 2014-08-04 LAB — BASIC METABOLIC PANEL
Anion Gap: 13 (ref 7–16)
BUN: 38 mg/dL — ABNORMAL HIGH (ref 7–18)
Calcium, Total: 7.5 mg/dL — ABNORMAL LOW (ref 8.5–10.1)
Chloride: 96 mmol/L — ABNORMAL LOW (ref 98–107)
Co2: 26 mmol/L (ref 21–32)
Creatinine: 10.39 mg/dL — ABNORMAL HIGH (ref 0.60–1.30)
EGFR (African American): 5 — ABNORMAL LOW
EGFR (Non-African Amer.): 4 — ABNORMAL LOW
Glucose: 177 mg/dL — ABNORMAL HIGH (ref 65–99)
Osmolality: 284 (ref 275–301)
Potassium: 5.3 mmol/L — ABNORMAL HIGH (ref 3.5–5.1)
Sodium: 135 mmol/L — ABNORMAL LOW (ref 136–145)

## 2014-08-04 LAB — CLOSTRIDIUM DIFFICILE(ARMC)

## 2014-08-04 LAB — TROPONIN I: Troponin-I: <0.02 ng/mL

## 2014-08-07 LAB — STOOL CULTURE

## 2014-08-08 ENCOUNTER — Ambulatory Visit: Payer: Self-pay | Admitting: Gastroenterology

## 2014-08-11 ENCOUNTER — Ambulatory Visit: Payer: Self-pay | Admitting: Vascular Surgery

## 2014-08-24 ENCOUNTER — Ambulatory Visit: Payer: Self-pay | Admitting: Internal Medicine

## 2014-09-15 ENCOUNTER — Emergency Department: Payer: Self-pay | Admitting: Internal Medicine

## 2014-09-15 LAB — URINALYSIS, COMPLETE
Bacteria: NONE SEEN
Bilirubin,UR: NEGATIVE
Blood: NEGATIVE
Glucose,UR: 150 mg/dL (ref 0–75)
Ketone: NEGATIVE
Leukocyte Esterase: NEGATIVE
Nitrite: NEGATIVE
Ph: 9 (ref 4.5–8.0)
Protein: 100
RBC,UR: 4 /HPF (ref 0–5)
Specific Gravity: 1.008 (ref 1.003–1.030)
Squamous Epithelial: 1
WBC UR: 2 /HPF (ref 0–5)

## 2014-09-15 LAB — COMPREHENSIVE METABOLIC PANEL
Albumin: 3 g/dL — ABNORMAL LOW (ref 3.4–5.0)
Alkaline Phosphatase: 105 U/L
Anion Gap: 10 (ref 7–16)
BUN: 19 mg/dL — ABNORMAL HIGH (ref 7–18)
Bilirubin,Total: 0.5 mg/dL (ref 0.2–1.0)
Calcium, Total: 8.3 mg/dL — ABNORMAL LOW (ref 8.5–10.1)
Chloride: 99 mmol/L (ref 98–107)
Co2: 26 mmol/L (ref 21–32)
Creatinine: 6.79 mg/dL — ABNORMAL HIGH (ref 0.60–1.30)
EGFR (African American): 8 — ABNORMAL LOW
EGFR (Non-African Amer.): 7 — ABNORMAL LOW
Glucose: 118 mg/dL — ABNORMAL HIGH (ref 65–99)
Osmolality: 273 (ref 275–301)
Potassium: 4.8 mmol/L (ref 3.5–5.1)
SGOT(AST): 43 U/L — ABNORMAL HIGH (ref 15–37)
SGPT (ALT): 16 U/L
Sodium: 135 mmol/L — ABNORMAL LOW (ref 136–145)
Total Protein: 7.2 g/dL (ref 6.4–8.2)

## 2014-09-15 LAB — CBC
HCT: 34.2 % — ABNORMAL LOW (ref 35.0–47.0)
HGB: 10.5 g/dL — ABNORMAL LOW (ref 12.0–16.0)
MCH: 29.9 pg (ref 26.0–34.0)
MCHC: 30.8 g/dL — ABNORMAL LOW (ref 32.0–36.0)
MCV: 97 fL (ref 80–100)
Platelet: 203 10*3/uL (ref 150–440)
RBC: 3.51 10*6/uL — ABNORMAL LOW (ref 3.80–5.20)
RDW: 16.5 % — ABNORMAL HIGH (ref 11.5–14.5)
WBC: 10 10*3/uL (ref 3.6–11.0)

## 2014-09-15 LAB — LIPASE, BLOOD: Lipase: 282 U/L (ref 73–393)

## 2014-10-05 IMAGING — CR DG CHEST 1V PORT
1 series · 1 of 1 positions shown · non-contrast
Comparison: 12/03/2013

CLINICAL DATA: Chest and shoulder pain.

EXAM:
PORTABLE CHEST - 1 VIEW

[ap]
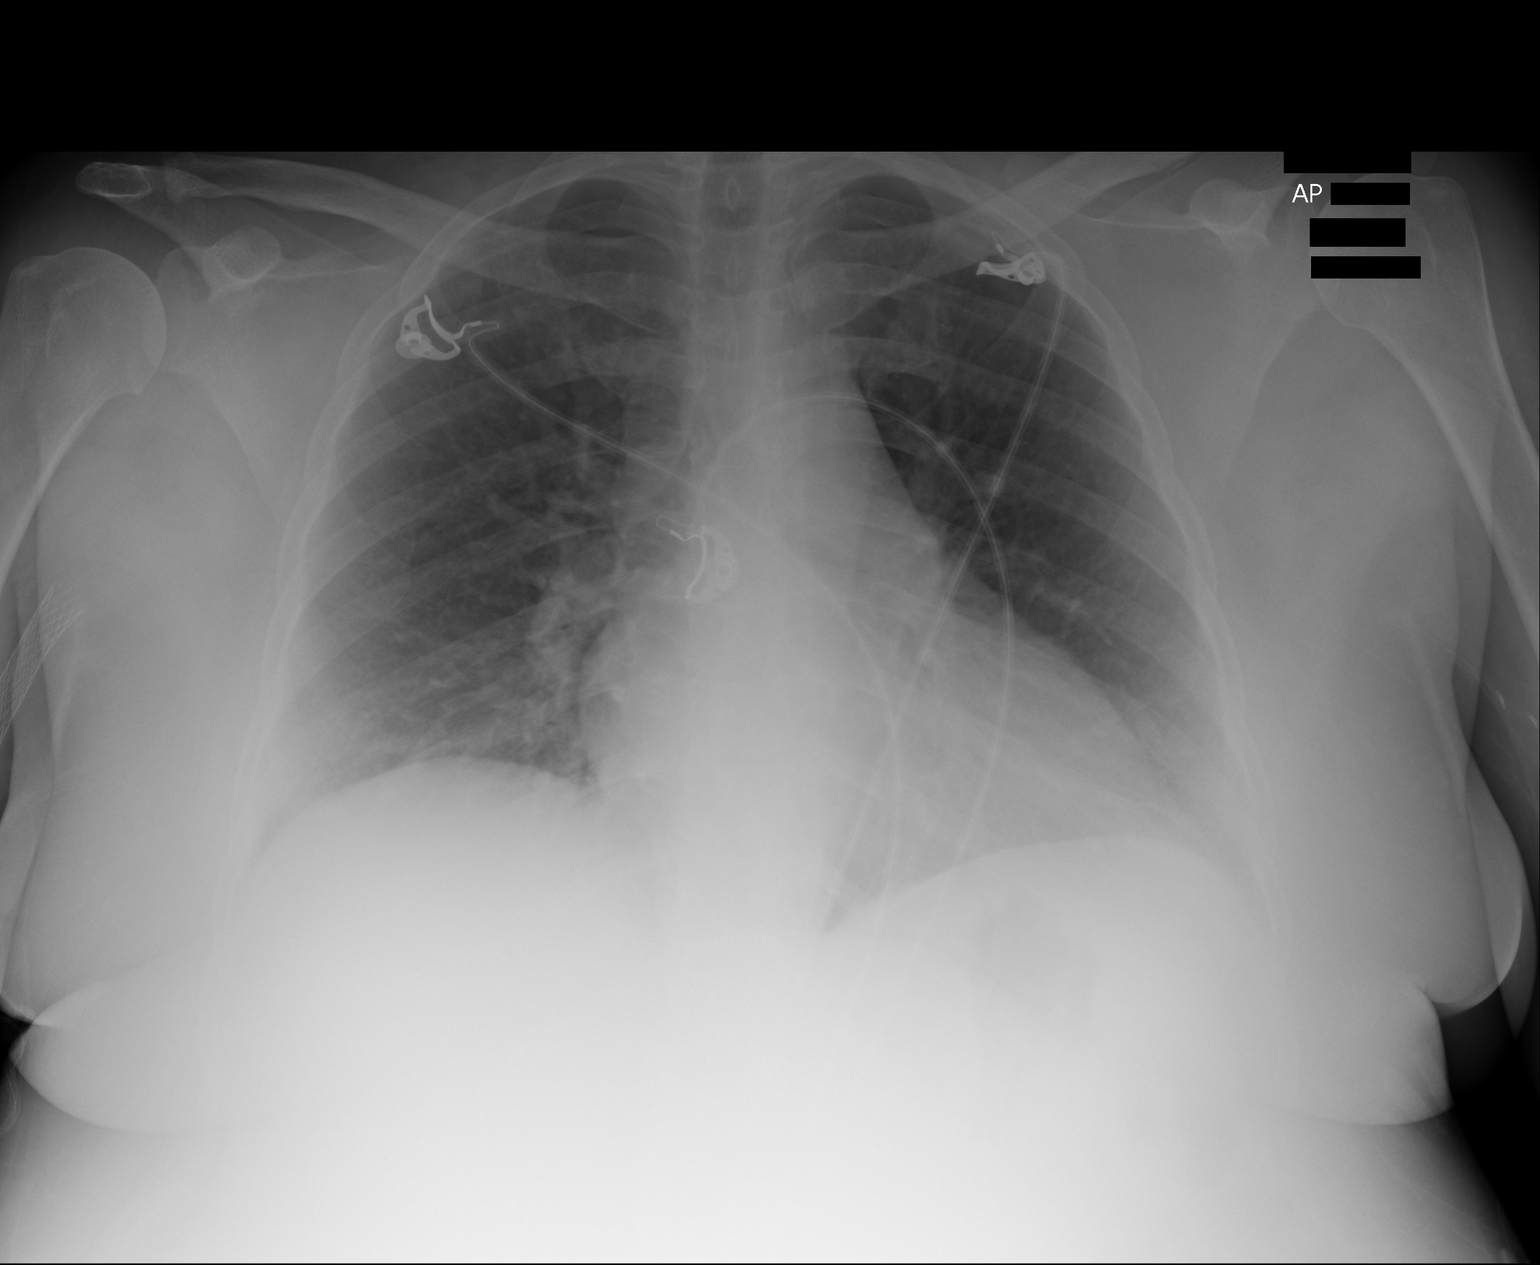

[1 of 1 positions shown; findings below may reference images not displayed]

FINDINGS: The heart size and mediastinal contours are within normal limits.
Both lungs are clear. The visualized skeletal structures are
unremarkable.
IMPRESSION: No active disease.

## 2015-01-09 NOTE — Op Note (Signed)
PATIENT NAME:  [name], [name] MR#:  [id number] DATE OF BIRTH:  01/28/1961  DATE OF PROCEDURE:  07/02/2012  PREOPERATIVE DIAGNOSES:  1. Poorly functioning dialysis access with increasing recirculation and prolonged bleeding following decannulation.  2. End-stage renal disease requiring hemodialysis.   POSTOPERATIVE DIAGNOSES:  1. Poorly functioning dialysis access with increasing recirculation and prolonged bleeding following decannulation.  2. End-stage renal disease requiring hemodialysis.   PROCEDURES PERFORMED: 1. Right upper extremity shuntogram.  2. Percutaneous transluminal angioplasty of the subclavian vein to 10 mm.   SURGEON: Hortencia Pilar, M.D.   SEDATION: Versed 4 mg plus fentanyl 150 mcg administered IV. Continuous ECG, pulse oximetry and cardiopulmonary monitoring was performed throughout the entire procedure by the interventional radiology nurse. Total sedation time was approximately one hour.   ACCESS: 6 French sheath, antegrade puncture, right arm brachial axillary dialysis graft.   CONTRAST USED: Isovue 25 mL.   FLUORO TIME: 2.4 minutes.   INDICATIONS: Ms. Perrins is a 54 year old woman currently maintained on hemodialysis via a right arm brachial axillary dialysis graft. She has undergone multiple interventions in the past, but the graft has been working quite well. Recently the dialysis center has noted increasing recirculation as well as prolonged bleeding following decannulation. The risks and benefits for treatment were reviewed, all questions answered, and the patient agrees to proceed.   DESCRIPTION OF PROCEDURE: The patient is taken to special procedures and placed in the supine position. After adequate sedation is achieved, the right arm is extended palm upward and prepped and draped in sterile fashion. 1% lidocaine is infiltrated in the soft tissues overlying the palpable graft near the arterial anastomosis and, in an antegrade direction, a micropuncture needle is  inserted, microwire followed by microsheath, and J-wire followed by 6 French sheath is placed. Hand injection of contrast is then used to demonstrate the AV graft, in the central venous anatomy. Previously placed stents actually appear to be in relatively good condition. Existing pseudoaneurysm in the arterial puncture site still does have some flow, however, the stents in this area are unchanged from the previous study several months ago. The anastomotic stent is widely patent. The subclavian vein demonstrates an approximately 6 to 7 cm long length of sclerotic changes. The innominate vein and superior vena cava are widely patent.   3000 units of heparin is given, Magic torque wire is advanced through the graft and through the subclavian vein and first a 7 x 8 and then an 8 x 10 and subsequently a 10 x 8 balloon are inflated across the stenosis within the subclavian. All balloons do achieve profile with inflations of approximately 12 atmospheres. Inflations are for one minute. Follow-up angiography demonstrates significant improvement but under-sizing at the 7 mm and 8 mm level. At the 10 mm level, there is now luminal gain that matches the more proximal subclavian and offers rapid flow of contrast and significant reduction in the collaterals is also noted indicating a good result. Arterial anastomosis is patent.   Wire and balloon are removed, the sheath is pulled after 4-0 Monocryl pursestring is placed, and there are no immediate complications.   INTERPRETATION: Initial views demonstrate previously placed stents all appear to be in good condition and in appropriate positioning. The subclavian vein demonstrates a fairly long segment of high-grade stricture. Following serial dilatation up to 10 mm, there is an excellent result with rapid flow of contrast and marked decrease in the number of collaterals visualized suggesting significant improvement in patency of the subclavian.  SUMMARY: Successful  treatment of venous outflow stenosis, in the subclavian vein, as described above.  ____________________________ Katha Cabal, MD ggs:slb D: 07/02/2012 17:23:10 ET     T: 07/03/2012 09:09:05 ET        JOB#: [id number] cc: Katha Cabal, MD, <Dictator> Ellamae Sia, MD Katha Cabal MD ELECTRONICALLY SIGNED 07/07/2012 12:34

## 2015-01-12 NOTE — Consult Note (Signed)
Brief Consult Note: Diagnosis: nausea, vomiting, diarrhea.   Patient was seen by consultant.   Consult note dictated.   Recommend further assessment or treatment.   Comments: Please see full Gi consult [phone].  Patient seen and examined.  Patient with acute change of GI status of n/v diarrhea beginning 4 days ago.  Was started on abx on er visit 2 days ago.  Currently no n/v or diarrhea since last night.  I am hesitant to proveed with the previously scheduled EGD and colonoscopy under the current clinical circumstance, as if this is and acute onset enteritis/entero colitis, biopsies for evaluation of the chronic issues will not be accurate.  Patient is feeling better than on admission, indeed, the n/v and diarrhea has apparently resolved, vitals are stable and there is no fever.  Patient does have right sided abdominal pain in the setting of chronic right sided abdominal discomfort. Continue current management. Following with you.  Electronic Signatures: Loistine Simas (MD)  (Signed 13-Feb-14 17:45)  Authored: Brief Consult Note   Last Updated: 13-Feb-14 17:45 by Loistine Simas (MD)

## 2015-01-12 NOTE — Consult Note (Signed)
PATIENT NAME:  [name], [name] MR#:  [id number] DATE OF BIRTH:  May 05, 1961  DATE OF CONSULTATION:  11/04/2012  REFERRING PHYSICIAN:  Vivek J. Verdell Carmine, MD CONSULTING PHYSICIAN:  Lollie Sails, MD  REASON FOR CONSULTATION:  Nausea, vomiting and diarrhea. There is also a finding of possible colitis seen on CT scan.   HISTORY OF PRESENT ILLNESS:  The patient is a 54 year old female with whom I am familiar. I have seen her on multiple occasions in the past in regards to problems with recurrent issues of diabetic gastroparesis. She had been seen by my nurse practitioner in the outpatient clinic on 08/12/2012. At that time, her complaints were some variable bowel habits, sometimes firm, sometimes softer, also some issues with possible dysphagia. She does take a proton pump inhibitor, Protonix, which she has been taking up to twice a day. This seems to be cervical difficulty. She does not induce emesis and she has not been regurgitating foods. She was scheduled to have an EGD and colonoscopy tomorrow for further evaluation of change of bowel habits, gastroesophageal reflux, intermittent dysphagia and nausea. In the interim, she apparently had some change to her medical status. She stated that this past Monday evening she developed problems with nausea, vomiting and diarrhea. She stated she was feeling fine on Sunday. She came to the hospital Emergency Room on Tuesday and was evaluated at that time and was told she had a "diverticulitis flare." She went home on some antibiotics and felt okay until Wednesday when the nausea, vomiting and diarrhea began again. She then came to the hospital and was admitted with abdominal pain, nausea and vomiting. There was a CT done on Tuesday that indicated diverticulosis and no evidence of acute diverticulitis, but questionable areas in the colon reflecting possible colitis. However, this was a limited study without contrast and these areas were in the descending and proximal  sigmoid colon. There was also some note of slight thickening of the wall of the urinary bladder correlate for a urinalysis with cystitis. Currently, the patient seems to be doing well. She has had no nausea, vomiting or diarrhea since she came into the hospital. She has had no bowel movement since last night. She is currently eating some clear liquids. She currently denies any abdominal pain. Again, her dysphagia symptoms seem to be more so cervical. There is no accompanying regurgitation. She has not had something stuck that she had to throw up. She did have some increase of heartburn last week; however, generally is well controlled with her Protonix twice a day. She does have variable bowel habits on a chronic basis, sometimes more loose, sometimes more firm.   PAST MEDICAL HISTORY:  She has a history of end-stage renal disease and is on Monday, Wednesday, Friday hemodialysis. There is a history of insulin-dependent diabetes, hypertension and hyperlipidemia. She has a history of coronary artery disease, has had 2 MIs and 2 cardiac stents. She has a history of gastroesophageal reflux, history of osteoarthritis. She has had hand surgery. She has problems with insomnia. She has a history of laser surgery with implants to the eyes; this likely more so being cataract surgery with implants. She has had cholecystectomy, appendectomy, hysterectomy. She does have 1 ovary. She has had a graft in her right upper extremity for her hemodialysis.   OUTPATIENT MEDICATIONS:  Acetaminophen/hydrocodone 325/5 mg, aspirin 81 mg once a day, atorvastatin 20 mg once a day, cetirizine 10 mg a day, Cipro 500 mg 1 twice a day (this apparently being  started in the Emergency Room the other night), citalopram 20 mg a day, Flagyl 500 mg twice a day again for a 10-day course started in the ER, Levemir 6 units subcutaneous once a day, metoclopramide 5 mg 1 tablet 4 times a day, metoprolol 50 mg 1 twice a day, MiraLax once a day as needed for  constipation, multivitamin 1 tablet a day, nitroglycerin 0.4 mg sublingual q. 5 minutes p.r.n. x 3, Novolin R on a sliding scale, pantoprazole 40 mg p.o. twice a day, Plavix 75 mg once a day, vitamin D3 at 1000 International Units daily, Zofran 4 mg twice a day.   ALLERGIES:  SHE IS ALLERGIC TO TAPE AND ADHESIVES, ACE INHIBITORS, CODEINE, OXYCODONE, OXYCONTIN AND PROMETHAZINE. UNCERTAIN OF THE REACTIONS TO THESE MEDICATIONS.   PHYSICAL EXAMINATION: VITAL SIGNS: Temperature 98, pulse 55, respirations 20, blood pressure 146/71, pulse oximetry 94%.  GENERAL: She is a 54 year old female in no acute distress. Does not appear to be in any discomfort.  HEENT: Normocephalic, atraumatic. Eyes are anicteric. Nose: Septum midline. No lesions. Oropharynx: No lesions.  NECK: No JVD.  HEART: Regular rate and rhythm without rub or gallop.  LUNGS: Bilaterally clear.  ABDOMEN: Soft. She is tender on the right side of the abdomen. It is of note that the abnormality seen on CT was on the left side of the abdomen. Bowel sounds positive, normoactive.  RECTAL: Anorectal exam is deferred.  EXTREMITIES: No clubbing, cyanosis or edema.  NEUROLOGICAL: Cranial nerves II through XII grossly intact. Muscle strength bilaterally equal and symmetric.   DIAGNOSTIC DATA:  Yesterday, she had glucose of 203, BUN 24, creatinine 7.01, sodium 137, potassium 3.5, chloride 98, bicarbonate 33, lipase 70, calcium 9.4. Hepatic profile was normal. Hemogram showed a white count of 6.3, H and H of 10.2/31.2, platelet count 178, normal indices. She had a urinalysis showing protein greater than 500, negative nitrite and negative leukocyte esterase. Repeat labs today showed a hemogram with a white count of 7.3, H and H 9.5/29.7, platelet count 157, normal indices. Today, her BUN and creatinine are 28 and 8.08 respectively. Again, she had a CT scan on February 11th, this being as stated above. She had a 3-way abdominal film that was nonspecific, gas  pattern nonspecific. She had a chest film yesterday showing no acute cardiopulmonary disease.   ASSESSMENT:  The patient is presenting with what appears to be an acute diarrheal illness. This also involved nausea and vomiting. There continues to be some abdominal discomfort on the right side of the abdomen. Her diarrhea however has stopped as of last night. She has had no nausea or vomiting today. It is of note that she has an EGD and colonoscopy scheduled for tomorrow as an outpatient. These were to evaluate problems of nausea, gastroesophageal reflux disease, intermittent dysphagia and alternating bowel habits. I am a bit hesitant to do a colonoscopy for her tomorrow in that she has had this acute possible enterocolitis of which she is now recovering. Biopsies done in the colon may not be accurate under this clinical circumstance. She is stable from other gastrointestinal aspects. In review, the patient did have an EGD on 09/10/2009, this being for epigastric abdominal pain, nausea and vomiting with abdominal pain in the right upper quadrant. This showed a normal esophagus, some gastritis and probable gastroparesis secondary to her diabetes. The duodenum was normal. Biopsies of the antrum and body showed some focal mucosal calcinosis. This likely was related to long term use of antacids and sucralfate. There  was some mild chronic gastritis, but no evidence of Helicobacter pylori. Also, she had a colonoscopy on 06/08/2007 for the complaint of abdominal pain as well as diarrhea of unexplained origin, constipation alternating and modification of bowel habits. She showed evidence on that study of diverticulosis in the sigmoid colon, descending and transverse colon, a few nonbleeding colonic angiectasias and biopsies were negative for microcytic colitis. She also had an EGD on that same date showing mucosal abnormality of the gastric vault with erythema and a grade B reflux esophagitis, again negative for  Helicobacter pylori.   RECOMMENDATION:  She currently has stool studies ordered; however, she has not had any diarrhea since she came into the hospital for these to be collected. Further, she is also on Cipro and Flagyl and I would finish that course of antibiotics prior to rearranging EGD and colonoscopy. I will follow with you while she is in the hospital. If there is any clinical change, we may reconsider.    ____________________________ Lollie Sails, MD mus:si D: 11/04/2012 17:38:59 ET T: 11/04/2012 18:10:58 ET JOB#: [id number]  cc: Lollie Sails, MD, <Dictator> Lollie Sails MD ELECTRONICALLY SIGNED 11/21/2012 10:40

## 2015-01-12 NOTE — Discharge Summary (Signed)
PATIENT NAME:  [name], [name] MR#:  [id number] DATE OF BIRTH:  November 18, 1960  DATE OF ADMISSION:  07/18/2013 DATE OF DISCHARGE:  07/20/2013  REASON FOR ADMISSION: The patient presented with hyperkalemia.   DISCHARGE DIAGNOSES: 1.  Hyperkalemia, resolved after dialysis.  2.  Clotting and failure of hemodialysis arteriovenous graft.  3.  Successful arteriovenous graft declotting.  4.  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  5.  End-stage renal disease with dialysis Monday, Wednesday and Friday. 6.  Type 2 diabetes, controlled.  7.  Hypertension.  8.  Coronary artery disease, status post stent. 9.  Depression and anxiety.  10.  Gastroparesis. 11.  Anemia of chronic disease.   PROCEDURES:  1.  A temporary dialysis catheter done on July 18, 2013, in the groin.  2.  On October 28th, contrast injection of the left arm brachial axillary dialysis graft and insertion of left internal jugular cuff tunneled dialysis catheter.    POSTOPERATIVE DIAGNOSES:  Thrombosis of the right arm brachial axillary dialysis cuff, end-stage renal disease rag hemodialysis DISPOSITION: Home.   FOLLOWUP: Dr. Holley Raring and Dr. Candiss Norse, nephrology; Dr. Leotis Pain, vascular surgeon and Dr. Hortencia Pilar, vascular surgery for outpatient venogram.    FOLLOWUP:  Dr. Ellin Goodie, primary practitioner, in the next 1 to 2 weeks.   MEDICATIONS AT DISCHARGE:  Citalopram 20 mg daily, aspirin 81 mg daily, Novolin R per sliding scale, Levemir 6 units once a day at bedtime, acetaminophen with hydrocodone 325/5 every 12 hours, metoprolol tartrate 50 mg twice daily, amlodipine 10 mg daily, ranitidine 150 mg daily, Zyrtec 10 mg daily, isosorbide mononitrate 60 mg twice daily, metoclopramide 5 mg 3 times daily, Zofran as needed for nausea, Plavix 75 mg daily, Renvela 2400 mg 3 times a day with meals with 1500 mg with snacks.    HOSPITAL COURSE:  [name] is a very nice 54 year old female with history of end-stage renal disease who was admitted due to elevation of potassium. The patient had an AV graft malfunction with clotting for which she was scheduled to have PermCath placement.  The patient had a temporary catheter in the groin.  She was noted to have a potassium of 6.6, for which she was admitted. Her potassium was treated peripherally with Kayexalate and with hemodialysis. After hemodialysis, her potassium dropped and she was taken to the OR for declotting of her fistula which was unsuccessful. The patient underwent a new PermCath placement. She was able to be discharged with outpatient dialysis. Overall the patient did well during this hospitalization.  She is to follow up with Dr. Lucky Cowboy for a venogram to evaluate the possibility to place another AV graft.    TIME SPENT: I spent about 45 minutes on this discharge on 07/20/2013.   ____________________________ Veteran Sink, MD rsg:cs D: 07/24/2013 13:07:00 ET T: 07/24/2013 19:50:11 ET JOB#: [id number]  cc: Tama High, MD Algernon Huxley, MD Katha Cabal, MD Ellamae Sia, MD [place] Sink, MD, <Dictator>   [place] Gipe America Brown MD ELECTRONICALLY SIGNED 08/05/2013 23:42

## 2015-01-12 NOTE — Consult Note (Signed)
Pt with N/V/D and possible colitis on noncontrast CT. Pt on schedule for colonoscopy tomorrow AM with Dr. Gustavo Lah. Endoscopy aware of patient in hospital. Await stool studies. If positive for c.diff, then treat for c.diff and hold off on colonoscopy. If c.diff neg, then give bowel prep tonight IF patient can tolerate bowel prep for colonoscopy tomorrow. Make sure patients get dialyzed  today? prior to colonoscopy. THanks  Electronic Signatures: Verdie Shire (MD)  (Signed on 13-Feb-14 08:47)  Authored  Last Updated: 13-Feb-14 08:47 by Verdie Shire (MD)

## 2015-01-12 NOTE — Op Note (Signed)
PATIENT NAME:  [name], [name] MR#:  [id number] DATE OF BIRTH:  Feb 05, 1961  DATE OF PROCEDURE:  07/18/2013  PREOPERATIVE DIAGNOSES:  1.  End-stage renal disease with clotted access.  2.  Hyperkalemia with potassium is of 6.6.  3.  Chronic nausea and vomiting.   POSTOPERATIVE DIAGNOSES:  1.  End-stage renal disease with clotted access.  2.  Hyperkalemia with potassium is of 6.6.  3.  Chronic nausea and vomiting.   PROCEDURES:  1.  Ultrasound guidance for vascular access, right femoral vein.  2.  Placement of right femoral Trialysis-type dialysis catheter, 30 cm in length.   SURGEON: Algernon Huxley, M.D.   ANESTHESIA: Local.   ESTIMATED BLOOD LOSS: Minimal.   INDICATION FOR PROCEDURE: A 54 year old female with end-stage renal disease. She presented today for attempt at declot of her dialysis access. However, potassium was prohibitively high for such a procedure, and she will have to have a temporary catheter placed and dialysis administered before this can be considered. Risks and benefits were discussed. Informed consent was obtained.   DESCRIPTION OF PROCEDURE: The patient is brought to the vascular interventional radiology area. The right groin was sterilely prepped and draped, and a sterile surgical field was created. The right femoral vein was visualized with ultrasound and found to be patent. It was then accessed under direct ultrasound guidance without difficulty with Seldinger needle. A J-wire was placed.   After skin nick and dilatation, the 30 cm long Trialysis-type dialysis catheter was placed over the wire and the wire was removed. All three lumens withdrew dark red blood and flushed easily with sterile saline. It was then secured to the skin at 30 cm with 3 nylon sutures.  ____________________________ Algernon Huxley, MD jsd:cg D: 07/18/2013 15:47:24 ET T: 07/18/2013 23:21:23 ET JOB#: [id number]  cc: Algernon Huxley, MD, <Dictator> Algernon Huxley MD ELECTRONICALLY SIGNED 07/20/2013  13:53

## 2015-01-12 NOTE — Op Note (Signed)
PATIENT NAME:  [name], [name] MR#:  [id number] DATE OF BIRTH:  May 03, 1961  DATE OF PROCEDURE:  03/03/2013  PREOPERATIVE DIAGNOSES: 1.  End-stage renal disease.  2.  Poorly functioning right arm arteriovenous graft with diminished flow.  3.  Pseudoaneurysm, arterial access site, right arm arteriovenous graft.  4.  Diabetes.  5.  Hypertension.  6.  Heart disease.   POSTOPERATIVE DIAGNOSES:  1.  End-stage renal disease.  2.  Poorly functioning right arm arteriovenous graft with diminished flow.  3.  Pseudoaneurysm, arterial access site, right arm arteriovenous graft.  4.  Diabetes.  5.  Hypertension.  6.  Heart disease.   PROCEDURES PERFORMED: 1.  Ultrasound guidance for vascular access to right arm arteriovenous graft.  2.  Right upper extremity shuntogram and central venogram.  3.  Percutaneous transluminal angioplasty of right axillary subclavian and innominate veins with 8 mm diameter angioplasty balloon.  4.  Covered stent placement to arterial access site with an 8 mm diameter x 10 cm in length Viabahn stent.   SURGEON: Algernon Huxley, M.D.   ANESTHESIA: Local with moderate conscious sedation.   ESTIMATED BLOOD LOSS: Minimal.   CONTRAST USED: 65 mL Visipaque.   FLUOROSCOPY TIME: Approximately 6 minutes.   INDICATION FOR PROCEDURE: A 54 year old [place] American female with long-standing end-stage renal disease. She has multiple problems with her graft including a sizable pseudoaneurysm at the arterial access site and poor flows with poor clearances.  We are evaluating this with shuntogram. Risks and benefits were discussed. Informed consent was obtained.   DESCRIPTION OF PROCEDURE: The patient is brought to the vascular and interventional radiology suite. The right upper extremity was sterilely prepped and draped and a sterile surgical field was created. We accessed not far beyond the arterial anastomosis with a micropuncture needle under direct ultrasound guidance and a permanent  image was recorded. A micropuncture wire and sheath were placed and then I upsized to a 6-French sheath. Imaging was performed. There was a stent fracture at the arterial access site with a moderate to large size pseudoaneurysm there that was greater than 2 cm.  The midportion of the graft was diffusely dilated.  The venous stent was patent. The axillary vein just beyond the venous stent was patent, but then occluded, and the axillary and subclavian veins were occluded reconstituting near the innominate vein. The patient was heparinized. I was able to traverse the graft across the venous occlusion with a stiff angled Glidewire and a Kumpe catheter confirming intraluminal flow in the right atrium. I then replaced a Magic torque wire. I treated the venous occlusion with a 7 and then an 8 mm diameter angioplasty balloon. We had to inflate to 24 atmospheres to break the waste in the axillary and subclavian veins. With this the vein was patent. There was approximately 20% residual stenosis that did not appear flow limiting and at this point I turned my attention to the pseudoaneurysm. I had to exchange for a 7-French sheath. Due to the dilatation in this area I needed to use a slightly larger Viabahn and an 8 mm diameter x 10 cm in length Viabahn stent was deployed over an 0.018 wire crossing the pseudoaneurysm at the arterial access site. This was postdilated with an 8 mm diameter balloon with an excellent angiographic completion result and exclusion of the pseudoaneurysm. At this point, I elected to terminate the procedure.  A 4-0 Monocryl pursestring suture was placed around the sheath, the sheath was removed, pressure was held and  a sterile dressing was placed. The patient tolerated the procedure well and was taken to the recovery room in stable condition. ____________________________ Algernon Huxley, MD jsd:sb D: 03/03/2013 10:22:44 ET T: 03/03/2013 11:51:34 ET JOB#: [id number]  cc: Algernon Huxley, MD,  <Dictator> Algernon Huxley MD ELECTRONICALLY SIGNED 03/17/2013 14:39

## 2015-01-12 NOTE — Consult Note (Signed)
Chief Complaint:  Subjective/Chief Complaint seen for n/v diarhea.  mild nausea last night, no vomiting,. no bm since admission.  Currently in dialysis.  no c/o abdominal pain.   VITAL SIGNS/ANCILLARY NOTES: **Vital Signs.:   14-Feb-14 04:30  Vital Signs Type Routine  Temperature Temperature (F) 97.8  Celsius 36.5  Temperature Source oral  Pulse Pulse 55  Respirations Respirations 20  Systolic BP Systolic BP [id number]  Diastolic BP (mmHg) Diastolic BP (mmHg) 66  Mean BP 93  Pulse Ox % Pulse Ox % 97  Pulse Ox Activity Level  At rest  Oxygen Delivery Room Air/ 21 %   Brief Assessment:  Cardiac Regular   Respiratory clear BS   Gastrointestinal details normal Soft  Nondistended  Bowel sounds normal  No rebound tenderness  mild ruq discomfort to palpation   Assessment/Plan:  Assessment/Plan:  Assessment 1) n/v diarrhea-acute changes, resolving.  probable enteritis/enterocolitis.  2) h/o gastroparesis, related to IDDM.  patient missed her mid week dialysis.  this can also be associated with n/v.  no cultures obtained, currently on cipro/flagyl. Patient has a history of chronic intermittant nausea and variable bowel habits.   Plan 1) will hold previously scheduled proceedures until current is resolved and antibiotic course is completed.  Will rearrange her egd and colonoscopy for about 2 weeks from now.  Continue current GI meds including reglan, pantoprazole and Align probiotic. will place on low residue if tolerating full liquids.  Will make Dr Dionne Milo is covering for the weekend, call him if needed.   Electronic Signatures: Loistine Simas (MD)  (Signed 14-Feb-14 09:35)  Authored: Chief Complaint, VITAL SIGNS/ANCILLARY NOTES, Brief Assessment, Assessment/Plan   Last Updated: 14-Feb-14 09:35 by Loistine Simas (MD)

## 2015-01-12 NOTE — Consult Note (Signed)
Brief Consult Note: Diagnosis: C diff colitis.   Patient was seen by consultant.   Consult note dictated.   Comments: [name] is a pleasant 54 y/o female with GERD, gastroparesis & hx of diverticulitis who presents with initial occurrence of c diff colitis after recent antibiotic use.  Flagyl will be changed to PO since she is not actively vomiting.  Given underlying gastroparesis, she has reglan & zofran for nausea.  I will discuss her care with Dr Allen Norris.  Plan: 1) Flagyl 500mg  PO TID for 14 days 2) Agree w/ reglan, zofran as ordered.  May need to change reglan to IV, we will see how she does with PO. 3) Continue zantac 4) enteric contact precautions 5) c diff precautions explained to pt & risks of retransmission 6) limit future use of antibiotics as able 7) outpatient EGD/colonoscopy by Dr Gustavo Lah should be postponed until recovered from c diff  Thanks for consult.  Please see full dictated note. [id number].  Electronic Signatures: [name] (NP)  (Signed 11-Jul-14 11:03)  Authored: Brief Consult Note   Last Updated: 11-Jul-14 11:03 by [name] (NP)

## 2015-01-12 NOTE — Op Note (Signed)
PATIENT NAME:  [name], [name] MR#:  [id number] DATE OF BIRTH:  [birth year]  DATE OF PROCEDURE:  07/19/2013  PREOPERATIVE DIAGNOSES:  1.  Thrombosis of right arm brachial axillary dialysis graft.  2.  End-stage renal disease requiring hemodialysis.  3.  Superior vena cava syndrome.   POSTOPERATIVE DIAGNOSES: 1.  Thrombosis of right arm brachial axillary dialysis graft.  2.  End-stage renal disease requiring hemodialysis.  3.  Superior vena cava syndrome.   PROCEDURES PERFORMED: 1.  Contrast injection, left arm brachial axillary dialysis graft.  2.  Insertion of left internal jugular cuffed tunneled dialysis catheter.   SURGEON: Hortencia Pilar, M.D.  SEDATION:  Precedex drip with supplemental fentanyl. Continuous ECG, pulse oximetry and cardiopulmonary monitoring is performed throughout the entire procedure by the interventional radiology nurse. Total sleep sedation time was 50 minutes.   ACCESS:  1.  A 6 French sheath, antegrade direction, right arm brachial axillary dialysis graft.  2.  Tunneled catheter access via left IJ.   CONTRAST USED: Isovue 35 mL.   FLUOROSCOPY TIME: 7.6 minutes.   INDICATIONS: Ms. Askeland is a 54 year old woman who presented to the hospital with thrombosis of her access port and was found to have critically high potassium. Subsequently, a temporary catheter was inserted, and she has undergone successful dialysis. She is now being returned to the special procedure suite for attempted salvage of her AV graft. She has consented for possible catheter insertion as well. Risks and benefits were reviewed. All questions answered. The patient agrees to proceed.   DESCRIPTION OF PROCEDURE: The patient is taken to special procedures and placed in the supine position. After adequate sedation is achieved with a Precedex drip. She is positioned supine with her right arm extended palm upward. The right arm is prepped and draped in a sterile fashion. 1% lidocaine is infiltrated in  the soft tissues near the arterial anastomosis and access to the AV graft is obtained with a micropuncture needle, microwire followed by microsheath, J-wire followed by a 6 French sheath. A small hand injection of contrast verifies thrombus within the graft. Floppy Glidewire and KMP catheter are then advanced first to the level of the venous anastomosis where the previously placed Flair stent is and subsequently even more centrally. The catheter demonstrates that there is a high-grade stenosis of essentially the entire length of the subclavian vein, which is now occluded. There is thrombus within the axillary vein. There are some collaterals which fill from injection at this level. The catheter and wire are negotiated into the mid jugular vein at the level of the mandible and hand injection demonstrates a sclerotic appearance of the right jugular vein with a high-grade narrowing at the confluence. In fact consideration should be given to the possibility that the catheter is actually in the anterior jugular vein or thyroid vein with even worse filling of the jugular. Either way, this is no longer a viable access for a catheter. Subsequently, the catheter is negotiated over into the left subclavian. At the level of the mid subclavian, there is a valve and images, more distally toward the axillary vein on the left not obtainable however, the catheter does reflux contrast up into the jugular to the level of the angle of the jaw and shows that the left jugular vein is widely patent, as is the innominate and superior vena cava.   Given the length of the occlusion of this subclavian segment, this does not appear to be a salvageable situation. The patient already has multiple  stents throughout her graft as well as into the axillary vein and, therefore, rather than attempt to salvage a nondurable poor access on the right, I elected to place a catheter and will work-up her left arm for new permanent access.   Pursestring  suture is placed around the sheath. The sheath is removed. Kumpe catheter and wire were removed. Light pressure is held, and subsequently the left neck and chest wall are prepped and draped in sterile fashion. Ultrasound is placed in a sterile sleeve. Jugular vein is identified on the left. It is echolucent and compressible indicating patency. Image is recorded for the permanent record. Under direct visualization jugular vein is accessed with a Seldinger needle. A J-wire is advanced under fluoroscopic guidance and negotiated into the inferior vena cava. Dilators are passed after a small counterincision is made, and a pocket created and subsequently the peel-away sheath is inserted. A 23 cm tip to cuff catheter is then advanced over the wire through the peel-away sheath, and under fluoroscopy, verified that the tips are in the atrium. Wire and peel-away sheath are then removed. The catheter is then adjusted for proper positioning and an exit site on the chest wall is selected. A small incision is created and the tunneling device is passed and subsequently tunnel is fashioned, catheter is pulled subcutaneously, and then the hub was connected. Both lumens aspirate and flush easily. Under fluoroscopy, the tips are in good position with a smooth contour, free of kinks. Heparin 5000 units per lumen is inserted. The catheter is secured to the skin of the chest wall with 0 silk, and neck counterincision was closed with 4-0 Monocryl and Dermabond. The patient tolerated the procedure well. There were no immediate complications. Sponge and needle counts were correct and she is taken to the recovery area in excellent condition. She will be worked up as an outpatient for a left arm access.    ____________________________ Katha Cabal, MD ggs:dp D: 07/20/2013 11:51:26 ET T: 07/20/2013 13:16:12 ET JOB#: [id number]  cc: Katha Cabal, MD, <Dictator> Katha Cabal MD ELECTRONICALLY SIGNED 07/29/2013 8:15

## 2015-01-12 NOTE — H&P (Signed)
PATIENT NAME:  [name], [name] MR#:  [id number] DATE OF BIRTH:  [birth year]  DATE OF ADMISSION:  03/31/2013  PRIMARY CARE PHYSICIAN: Dr. Ellin Goodie  PRIMARY NEPHROLOGIST:  Dr. Holley Raring   PRIMARY CARDIOLOGIST:  Dr. Humphrey Rolls with [facility].  CHIEF COMPLAINT: Nausea, vomiting, diarrhea.   HISTORY OF PRESENT ILLNESS: The patient is a pleasant 54 year old African American female with end-stage renal disease on dialysis who had a bout of colitis earlier in the year, who was seen by Dr. Gustavo Lah. She was discharged at that time on Cipro, Flagyl. An EGD and colonoscopy was supposed to come as an outpatient and per her, it is order to earlier this month with Dr. Gustavo Lah. She comes in after experiencing abdominal pain, nausea, vomiting, diarrhea. The patient stated the diarrhea started on Wednesday with two episodes, which is greenish in a color and had a bout of vomiting. Yesterday, she had multiple episodes of diarrhea, nausea, vomiting, again greenish stool. So today,  she has a dull epigastric abdominal pain, nonradiating. It is persistent. She felt hot and cold and maybe had sweats, but had no documented fevers. She came into the hospital where she has no fever or leukocytosis, but CAT scan picked up diffuse colitis and hospitalist services were contacted for further evaluation and management.   PAST MEDICAL HISTORY:  1.  End-stage renal disease on dialysis Mondays, Wednesdays, Fridays; missed dialysis on Wednesday.   2.  Diabetes.  3.  Hypertension.  4.  Coronary artery disease status post stent.  5.  Depression and anxiety.  6.  History of gastroparesis.  7.  History of anemia of chronic disease.  8.  Hyperlipidemia.   ALLERGIES: CODEINE, OXYCODONE AND OXYCONTIN.   FAMILY HISTORY: Mom with diabetes. Her father had lung cancer.   SOCIAL HISTORY: Smokes 1/2 pack a day and drinks red wine most nights one cup. No drug.   OUTPATIENT MEDICATIONS: Acetaminophen/hydrocodone 325/5 mg 1 tab every 12 hours as  needed for pain, amlodipine 10 mg daily, aspirin 81 mg daily, cetirizine 10 mg daily, citalopram 20 mg daily, isosorbide mononitrate 60 mg extended-release 1 tab in the morning, Levemir 6 units once a day at bedtime, metoclopramide 5 mg 4 times a day, metoprolol tartrate 50 mg 2 times a day, Novolin R sliding scale as needed, Zofran 4 mg 1 tab 2 times a day as needed for nausea, vomiting, Plavix 75 mg, ranitidine 150 mg once a day, Renvela 800 mg 3 times a day with meals.   REVIEW OF SYSTEMS:  CONSTITUTIONAL: No fevers, but felt hot and cold. No weight changes.  EYES: No blurry vision or double vision.  ENT: No tinnitus or hearing loss. No postnasal drip.  RESPIRATORY: Has a chronic dry off and on cough. No wheezing, shortness of breath or dyspnea on exertion.  CARDIOVASCULAR: No chest pain, palpitations, has lower extremity edema.  GASTROINTESTINAL: Positive for nausea, vomiting, diarrhea, abdominal pain as above. No black stools or dark stools. No bright red blood per rectum.  GENITOURINARY: Denies dysuria or hematuria.  HEMATOLOGIC AND LYMPHATIC: No anemia, but does have easy bruising.  SKIN: No rashes.  MUSCULOSKELETAL: Denies arthritis.  NEUROLOGIC: Has tingling and numbness in hands, but no seizures or stroke.  PSYCHIATRIC: Denies anxiety. Has a history of anxiety and depression.   PHYSICAL EXAMINATION: VITAL SIGNS: Temperature on arrival 97.7, pulse rate 67, respiratory rate 20, blood pressure 164/79, oxygen saturation 100% on room air.  GENERAL: The patient well-developed African American female lying in bed in no obvious  distress.  HEENT:  Normocephalic, atraumatic.  Left pupil is irregular, otherwise reactive. Extraocular muscles intact. Dry mucous membranes.  NECK: Supple. No thyroid tenderness. No cervical lymphadenopathy.  CARDIOVASCULAR: S1, S2. Positive for 3/6 diastolic murmur.  LUNGS: Clear to auscultation without wheezing, rhonchi or rales.  ABDOMEN: Soft, moderate tenderness  in epigastric, without rebound or guarding. Positive bowel sounds in all quadrants.  EXTREMITIES: No significant lower extremity edema.  NEUROLOGIC: Cranial nerves appear to be intact, II through XII. Strength and sensation is within normal limits.  PSYCHIATRIC: Awake, alert, oriented x 3.  SKIN: No obvious rashes.    LABORATORY, DIAGNOSTIC AND RADIOLOGIC DATA: CT of abdomen and pelvis as above, but the diffuse colonic wall thickening is somewhat diminished from February of this year.   Lipase is 947. LFTs within normal limits. Troponin negative. WBC 5.9, hemoglobin 12.7, platelets are 180. Urinalysis not suggestive of infection. BUN 31, creatinine 7.8, sodium 135.   ASSESSMENT AND PLAN: We have a 54 year old [place] American female with history of colitis, gastroparesis, diabetes, hypertension, dialysis, missed dialysis yesterday presents with a history of nausea, vomiting, diarrhea and a CAT scan showing colitis. At this point, would admit the patient to the hospital, as patient also has mildly elevated lipase without evidence of pancreatitis on imaging. We will admit the patient to the hospital, start her on Cipro, Flagyl, obtain a gastroenterology consult, send stool cultures for C. difficile and comprehensive panel and start her on clears and start her on Zofran for symptomatic relief.  We will the trend the lipase, but I think that this is more from the nausea, vomiting, diarrhea versus pancreatitis, as there is no evidence of pancreatitis on a CAT scan. I will hold the long-acting insulin, start her on start her on sliding scale insulin and check a hemoglobin A1c. We will start her on some gentle IV fluids for now and obtain nephrology consult for resumption of dialysis. If the patient cannot tolerate clears, we would make her nothing oral except meds. I would continue her outpatient medications at this point, including Reglan and Zofran, the allergy and depression medications as well. Her lactate  is  mildly elevated at 1.4, now with acute ischemic colitis in the differential. I would recheck lactate for later tonight. The patient also smokes tobacco. She was counseled for three minutes about smoking cessation. She did not want a nicotine patch. We would start her on deep vein thrombosis prophylaxis with heparin.   CODE STATUS:  The patient is full code.   TOTAL TIME SPENT: 50 minutes.    ____________________________ Vivien Presto, MD sa:cc D: 03/31/2013 17:13:53 ET T: 03/31/2013 17:41:11 ET JOB#: [id number]  cc: Vivien Presto, MD, <Dictator> Ellamae Sia, MD Vivien Presto MD ELECTRONICALLY SIGNED 04/23/2013 11:39

## 2015-01-12 NOTE — Discharge Summary (Signed)
PATIENT NAME:  [name], [name] MR#:  [id number] DATE OF BIRTH:  May 23, 1961  DATE OF ADMISSION:  03/31/2013 DATE OF DISCHARGE:  04/03/2013  PRESENTING COMPLAINT: Diarrhea.   DISCHARGE DIAGNOSES:  1.  Clostridium difficile colitis.  2.  End-stage renal disease, on hemodialysis.  3.  Type 2 diabetes.   CODE STATUS: Full code.   FOLLOWUP: With Dr. Kingsley Spittle in 1 week. Resume hemodialysis Monday, Wednesday, Friday as before.   DISCHARGE MEDICATIONS:   1.  Citalopram 20 mg daily.  2.  Aspirin 81 mg daily.  3.  Novolin R per sliding scale.  4.  (Dictation Anomaly) <<MISSING TEXT> 10 mg daily.  5.  Levemir 6 units subcutaneous daily at bedtime.  6.  Acetaminophen/hydrocodone 5/325 mg 1 b.i.d. as needed.  7.  Plavix 75 mg daily.  8.  Imdur 60 mg extended release daily.  9.  Metoclopramide 5 mg 4 times a day.  10. Metoprolol tartrate 50 mg b.i.d.  11. Amlodipine 10 mg daily.  12. Zofran 4 mg b.i.d. as needed.  13. Ranitidine 150 mg p.o. daily.  14. Renvela 800 mg 3 times a day.  15. Metronidazole 500 mg every 8 hours for 10 days.   DIET: Renal diet, mechanical soft.   LABS: Stool positive for C. diff. White count is 4.6, hemoglobin and hematocrit 11.3 and 35.2. Potassium is 3.8. Magnesium is 2.6. Lipase is 563. Hemoglobin A1c is 7.6. CT of the abdomen showed diffuse colonic wall thickening is present. Impression; findings suggestive of colitis. Lactic acid 1.4.   CONSULTATIONS: Nephrology with Dr. Juleen China and Dr. Holley Raring.   BRIEF SUMMARY OF HOSPITAL COURSE: Hanvika Stanley is a 54 year old African American female with history of end-stage renal disease and diabetes, who comes in with nausea and diarrhea. She is admitted with :  1.  C. difficile colitis. The patient was started on p.o. metronidazole and started on clear liquids. Diarrhea improved. She felt better after some IV fluids. She will finish up a 10 day course of metronidazole.  2.  End-stage renal disease, dialysis continued. The  patient was seen by Dr. Holley Raring and Dr. Juleen China while in-house.  3.  Type 2 diabetes remained stable.  4.  Hypertension. Continued amlodipine.  5.  Coronary artery disease, status post stent, asymptomatic. Aspirin, Plavix, Imdur and metoprolol was continued.  6.  Depression and anxiety. The patient was re-resumed on her citalopram.  7.  History of gastroparesis. P.r.n. Reglan was given.  8.  Anemia of chronic disease. Hemoglobin remained stable. Hospital stay otherwise remained uncomplicated. The patient remained a full code.   TIME SPENT: 40 minutes.  ____________________________ Hart Rochester Posey Pronto, MD sap:aw D: 04/04/2013 07:07:08 ET T: 04/04/2013 08:34:40 ET JOB#: [id number]  cc: Ram Haugan A. Posey Pronto, MD, <Dictator> [place] Lilian Kapur, MD Mamie Levers, MD Ilda Basset MD ELECTRONICALLY SIGNED 04/11/2013 19:04

## 2015-01-12 NOTE — H&P (Signed)
PATIENT NAME:  [name], [name] MR#:  [id number] DATE OF BIRTH:  Jun 22, 1961  DATE OF ADMISSION:  11/03/2012  PRIMARY CARE PHYSICIAN: Kingsley Spittle, MD   CHIEF COMPLAINT: Abdominal pain, nausea, vomiting and diarrhea.   HISTORY OF PRESENT ILLNESS: This is a 54 year old female who presents to the Emergency Room due to about a 4 to 5 day history of abdominal pain, nausea, vomiting and diarrhea. The patient actually came to the Emergency Room yesterday with similar complaints and had a CT scan, noncontrast, which showed colitis. She was discharged on p.o. ciprofloxacin and Flagyl. She barely could take anything by mouth the past 24 hours and therefore not able to keep her pills down. She therefore came to the ER for further evaluation today having failed outpatient p.o. antibiotic therapy. She describes her abdominal pain more in the right upper quadrant, nonradiating, crampy in nature, associated with nausea, vomiting and diarrhea. Her diarrhea has been nonbloody in nature. Her vomitus has also been nonbloody in nature. She admits to some chills, but no documented fever. She denies having any sick contacts. She denies being on any recent antibiotics. Given the fact that her symptoms have gotten worse despite being started on p.o. antibiotic therapy, hospitalist services were contacted for further treatment and evaluation.   REVIEW OF SYSTEMS: CONSTITUTIONAL: No documented fever. No weight gain or weight loss.  EYES: No blurry or double vision.  ENT: No tinnitus. No postnasal drip. No redness of the oropharynx.  RESPIRATORY: No cough. No wheeze. No hemoptysis. No dyspnea.  CARDIOVASCULAR: No chest pain. No orthopnea. No palpitations. No syncope.  GASTROINTESTINAL: Positive nausea. Positive vomiting. Positive diarrhea. No abdominal pain. No melena. No hematochezia.  GENITOURINARY: No dysuria. No hematuria.  ENDOCRINE: No polyuria or nocturia. No heat or cold intolerance.  HEME: No anemia. No bruising. No  bleeding.  INTEGUMENTARY: No rashes. No lesions.  MUSCULOSKELETAL: No arthritis. No swelling. No gout.  NEUROLOGIC: No numbness or tingling. No ataxia. No seizure-type activity.  PSYCH: Positive depression. No anxiety. No ADD.   PAST MEDICAL HISTORY: Consistent with end-stage renal disease on hemodialysis, history of diabetes, hypertension, history of coronary artery disease status post stent placement, depression/anxiety, history of gastroparesis, history of anemia of chronic disease and hyperlipidemia.   ALLERGIES: CODEINE, OXYCODONE AND OXYCONTIN, all of which cause nausea and rash.   FAMILY HISTORY: The patient's mother is alive. She had diabetes. Father died from complications of lung cancer.   SOCIAL HISTORY: She does smoke about 3/4 pack per day, has been smoking for the past 30 plus years. No alcohol abuse. No illicit drug abuse. Lives by herself.   CURRENT MEDICATIONS: 1. Tylenol with hydrocodone 5/325 mg 1 tab q. 12 hours as needed. 2. Aspirin 81 mg daily. 3. Atorvastatin 20 mg daily.  4. Cetirizine 10 mg daily. 5. Ciprofloxacin 500 mg b.i.d., started yesterday.  6. Celexa 20 mg daily. 7. Flagyl 500 mg b.i.d., started yesterday.  8. Levemir 6 units at bedtime. 9. Reglan 5 mg q.i.d. with meals. 10. Metoprolol tartrate 50 mg b.i.d. 11. MiraLax daily as needed for constipation.  12. Multivitamin daily. 13. Sublingual nitroglycerine as needed. 14. Norvasc 10 mg daily.  15. Novolin regular sliding scale. 16. Protonix 40 mg daily. 17. Plavix 75 mg daily. 18. Vitamin D3 1000 international units daily. 19. Zofran 4 mg b.i.d. as needed for nausea and vomiting.   PHYSICAL EXAMINATION:  VITAL SIGNS: Temperature is afebrile, pulse is 62, respirations 18, blood pressure 194/82 and sats 100% on room air.  GENERAL: She is a pleasant appearing female in mild to moderate distress.  HEAD, EARS, EYES, NOSE AND THROAT: Atraumatic, normocephalic. Extraocular muscles are intact. Pupils  equal and reactive to light. Sclerae anicteric. No conjunctival injection. No pharyngeal erythema.  NECK: Supple. No jugular venous distention. No bruits, no lymphadenopathy and no thyromegaly.  HEART: Regular rate and rhythm. She does have a diastolic rumbling murmur heard at the base. No rubs. No clicks.  LUNGS: Clear to auscultation bilaterally. No rales, no rhonchi and no wheezes. No dullness to percussion. Negative use of accessory muscles.  ABDOMEN: Soft, flat and tender in right upper quadrant. Hypoactive bowel sounds. No hepatosplenomegaly appreciated.  EXTREMITIES: No evidence of any cyanosis, clubbing or peripheral edema. Has +2 pedal and radial pulses bilaterally. The patient has a right upper extremity AV graft/fistula with good bruit and good thrill with no drainage.  NEUROLOGIC: She is alert, awake and oriented x 3 with no focal motor or sensory deficits appreciated bilaterally.  SKIN: Moist and warm with no rash appreciated.  LYMPHATIC: There is no cervical or axillary lymphadenopathy.   LABS AND IMAGING STUDIES:  Serum glucose 203, BUN 24, creatinine 7.01, sodium 137, potassium 3.5, chloride 98, bicarbonate 33. LFTs within normal limits. White cell count 6.3, hemoglobin 10.2, hematocrit 31.2 and platelet count 178.   The patient had a CT of the abdomen and pelvis done without contrast on February 11th which showed limited study without contrast suggesting colitis in the descending and proximal sigmoid colon, slight thickening of the wall of the urinary bladder. Correlate with urinalysis for cystitis. Some scattered colonic diverticulosis, but no evidence of acute diverticulitis.   ASSESSMENT AND PLAN: This is a 54 year old female with history of end-stage renal disease on hemodialysis, history of gastroparesis, diabetes, hypertension, history of coronary artery disease status post stent placement, depression/anxiety and anemia of chronic disease who presents to the hospital with  abdominal pain, nausea, vomiting, diarrhea and CT scan suggestive of colitis.  1. Abdominal pain, nausea, vomiting and diarrhea. The exact etiology of this is unclear, although suspect to be secondary to colitis given her CT scan findings. The CT was done noncontrast, therefore, it is a limited study. Would like to obtain a CT scan of the abdomen and pelvis with contrast, but I will await further GI input. For now we will admit her for IV antibiotics, start her on ciprofloxacin and Flagyl IV, pain control with some IV Dilaudid p.r.n., antiemetics and supportive care. We will get a GI consult. The patient was actually supposed to see Dr. Gustavo Lah to get a colonoscopy done this coming Friday. I will keep her n.p.o. except for medications. I will check stool for C. difficile and comprehensive culture.  2. End-stage renal disease, on hemodialysis. I will consult nephrology. Dialysis is on Monday, Wednesday and Friday. 3. History of gastroparesis, unlikely this is a gastroparesis flare. I will continue her IV Reglan.  4. Diabetes. Since she is going to be n.p.o., I will hold her Levemir and place her on sliding scale insulin for now.  5. History of coronary artery disease status post stent. The patient acutely has no chest pain. I will continue her aspirin, statin, beta blocker and nitroglycerin. Her Plavix is currently on hold as she was supposed to get a colonoscopy this coming Friday. I will continue to hold that in case she needs any further luminal evaluation.  6. Depression. Continue Celexa.  7. Hyperlipidemia. Continue atorvastatin.   CODE STATUS: The patient is a FULL CODE.  TIME SPENT ON ADMISSION: 50 minutes.  ____________________________ Belia Heman. Verdell Carmine, MD vjs:sb D: 11/03/2012 14:40:16 ET T: 11/03/2012 14:51:36 ET JOB#: [id number]  cc: Belia Heman. Verdell Carmine, MD, <Dictator> Henreitta Leber MD ELECTRONICALLY SIGNED 11/03/2012 17:36

## 2015-01-12 NOTE — Consult Note (Signed)
Chief Complaint:  Subjective/Chief Complaint patient admitted with diarrhea and found to have C. diff colitis. She says she has not had any bowel movements today. She also reports feeling better.   Brief Assessment:  GEN well developed, well nourished, no acute distress   Respiratory normal resp effort   Additional Physical Exam A and O x 3.   Lab Results: Routine Chem:  12-Jul-14 04:45   Glucose, Serum  103  BUN 15  Creatinine (comp)  5.24  Sodium, Serum 136  Potassium, Serum 3.8  Chloride, Serum 100  CO2, Serum 32  Calcium (Total), Serum  8.3  Anion Gap  4  Osmolality (calc) 273  eGFR (African American)  10  eGFR (Non-African American)  9 (eGFR values <70m/min/1.73 m2 may be an indication of chronic kidney disease (CKD). Calculated eGFR is useful in patients with stable renal function. The eGFR calculation will not be reliable in acutely ill patients when serum creatinine is changing rapidly. It is not useful in  patients on dialysis. The eGFR calculation may not be applicable to patients at the low and high extremes of body sizes, pregnant women, and vegetarians.)  Routine Hem:  12-Jul-14 04:45   WBC (CBC) 4.6  RBC (CBC)  3.73  Hemoglobin (CBC)  11.3  Hematocrit (CBC) 35.2  Platelet Count (CBC) 157  MCV 94  MCH 30.2  MCHC 32.0  RDW  16.4  Neutrophil % 31.1  Lymphocyte % 47.1  Monocyte % 11.3  Eosinophil % 8.7  Basophil % 1.8  Neutrophil # 1.4  Lymphocyte # 2.2  Monocyte # 0.5  Eosinophil # 0.4  Basophil # 0.1 (Result(s) reported on 02 Apr 2013 at 05:13AM.)   Assessment/Plan:  Assessment/Plan:  Assessment C. Diff colitis.   Plan This aptient was admitted with C.diff. She is feeling better without complaints. Diarrhea better without any bowel movements today. Continue abx and supportive care.   Electronic Signatures: WLucilla Lame(MD)  (Signed 12-Jul-14 10:03)  Authored: Chief Complaint, Brief Assessment, Lab Results, Assessment/Plan   Last  Updated: 12-Jul-14 10:03 by WLucilla Lame(MD)

## 2015-01-12 NOTE — Consult Note (Signed)
PATIENT NAME:  [name], [name] MR#:  [id number] DATE OF BIRTH:  1961-04-10  DATE OF CONSULTATION:  04/01/2013  REFERRING PHYSICIAN:  Vivien Presto, MD CONSULTING PHYSICIAN:  [name], MD / Andria Meuse, NP  PRIMARY CARE PHYSICIAN:  Kingsley Spittle, MD  NEPHROLOGIST:  Anthonette Legato, MD  CARDIOLOGIST:  Neoma Laming, MD  GASTROENTEROLOGIST:  Loistine Simas, MD  REASON FOR CONSULTATION: C. diff colitis.   HISTORY OF PRESENT ILLNESS: [name] is a pleasant 54 year old black female who has history of end-stage renal disease on dialysis, gastroparesis, chronic GERD and history of diverticular disease. She presented with profuse diarrhea that started 2 days ago. She tells me she was at a child custody hearing 2 days ago. She began to develop severe abdominal cramps. This was then followed by watery diarrhea with too numerous to count episodes. She began to have nausea and vomiting. She reports antibiotics recently through her primary care physician; however, she cannot give me details, but she does know that she did complete them. She was admitted in February of 2014 with colitis as well. She was seen by [name] at that time. Plans were made for an EGD and colonoscopy outpatient with [name] next week. However, this will now need to be postponed.  She was treated with antibiotics for colitis back in February as well. She is having lower abdominal cramps. She denies any rectal bleeding or melena. She has chronic GERD, on PPI. She has gastroparesis, on chronic narcotic therapy. She reports a 10 to 15 pound weight loss over the last 6 months. She is on aspirin and Plavix. She is taking Zantac 150 mg daily for her reflux and she has been started on p.r.n. Zofran and Reglan 5 mg q.i.d. She has Dilaudid for pain p.r.n. C. diff PCR was positive.   PAST MEDICAL AND SURGICAL HISTORY:  1.  End-stage renal disease on dialysis Monday, Wednesday and Friday 2.  Diverticulitis.  3.  Colitis February  2014.  4.  Diabetes mellitus.  5.  Gastroparesis.  6.  Chronic GERD and hiatal hernia.  7.  Hypertension.  8.  Coronary artery disease status post stent.  9.  Depression/anxiety.  10.  Anemia of chronic disease.  11.  Hyperlipidemia.   MEDICATIONS:  Prior to admission:   1.  Acetaminophen/hydrocodone 325/5 mg b.i.d. p.r.n. 2.  Amlodipine 10 mg daily. 3.  Aspirin 81 mg daily. 4.  Cetirizine 10 mg daily. 5.  Citalopram 20 mg daily. 6.  Isosorbide 60 mg extended release q. a.m.  7.  Levemir 100 units/mL 6 units subcutaneously at bedtime. 8.  Reglan 5 mg q.i.d. 9.  Metoprolol 50 mg b.i.d. 10.  Novolin R slide scale daily. 11.  Zofran 4 mg b.i.d. p.r.n. nausea and vomiting. 12.  Plavix 75 mg daily. 13.  Ranitidine 150 mg daily. 14.  Renvela carbonate 800 mg t.i.d. with meals.   ALLERGIES: ACE INHIBITORS - UNKNOWN REACTION, CODEINE - NAUSEA AND VOMITING, TAPE ADHESIVE - ITCHING, OXYCODONE - NAUSEA AND VOMITING, OXYCONTIN - UNKNOWN REACTION, PROMETHAZINE - NAUSEA AND VOMITING   FAMILY HISTORY: There is no known family history of colorectal carcinoma, liver or chronic GI problems. Mother had diabetes.  Father had lung cancer.   SOCIAL HISTORY: She smokes half pack per day, and she drinks a glass of wine nightly. Denies any illicit drug use.   REVIEW OF SYSTEMS:  See HPI. Respiratory: She does have a nonproductive chronic cough. Neurologic:  Paresthesias, diabetic neuropathy in lower extremities. Otherwise  negative 12 point review of systems.   PHYSICAL EXAMINATION: VITAL SIGNS: Temperature 97.4, pulse 54, respirations 20, blood pressure 151/61, O2 sat 100% on room air.  GENERAL: She is an obese black female who is alert, oriented, pleasant and cooperative, in no acute distress.  HEENT: Sclerae clear, anicteric. Conjunctivae pink. Oropharynx pink and moist without lesions.  NECK: Supple without mass or thyromegaly.  CHEST: Heart regular rate and rhythm with normal S1 and S2 without  murmurs, clicks, rubs or gallops.  LUNGS: Clear to expansion bilaterally.  ABDOMEN: Protuberant. Positive bowel sounds x 4. No bruits auscultated. Abdomen is soft. She does have mild diffuse tenderness to her entire abdomen. There is no rebound tenderness or guarding.  EXTREMITIES: Without edema or cyanosis bilaterally. SKIN:  Warm and dry without any rash or jaundice.  PSYCHIATRIC: She does have anxiety. She is alert and oriented. She has a normal affect.  NEUROLOGIC:  Grossly intact.  SKIN: Warm and dry without any rash or jaundice.   LABORATORY STUDIES: Glucose 134, BUN 34, creatinine 8.36, otherwise normal met-7. LFTs are normal. A1c 7.6.  Troponin less than 0.02. White blood cell count 5.8, hemoglobin 11.7, hematocrit 36.4, platelets 171. Urinalysis shows protein, blood, RBCs, WBCs, epithelial cells and mucus.   IMPRESSION: [name] is a pleasant 54 year old black female with chronic gastroesophageal reflux disease, gastroparesis and history of diverticulitis who presents with initial occurrence of Clostridium difficile colitis after recent antibiotic use. Flagyl will be changed to p.o. since she is not actively vomiting for better absorption in the gastrointestinal tract. Given underlying gastroparesis, she has Reglan and Zofran for nausea and hopefully she will be able to tolerate p.o. antibiotic therapy. I have discussed her care with Dr. Allen Norris.   PLAN: 1.  Flagyl 500 mg p.o. t.i.d. for 14 days.  2.  Contact precautions.  3.  Agree with Reglan and Zofran as ordered. May need to change Reglan to IV if she is unable to tolerate p.o.  4.  Continue Zantac.  5.  Clostridium difficile precautions at home explained to the patient and risk of retransmission. 6.  Limit future use of antibiotics if able.  7.  Outpatient EGD and colonoscopy with [name] should be postponed until recovered from Clostridium difficile.    We would like to thank you for allowing [place] to participate in the care of Ms.  [name].  This services provided by Andria Meuse, NP under collaborative agreement with Dr. [name].   ____________________________ Andria Meuse, NP klj:sb D: 04/01/2013 11:13:38 ET T: 04/01/2013 11:29:00 ET JOB#: [id number]  cc: Andria Meuse, NP, <Dictator> Ellamae Sia, MD [place] Lilian Kapur, MD Dionisio David, MD Lollie Sails, MD Andria Meuse FNP ELECTRONICALLY SIGNED 04/20/2013 15:01

## 2015-01-12 NOTE — H&P (Signed)
PATIENT NAME:  [name], TOBIASON MR#:  [id number] DATE OF BIRTH:  [birth year]  DATE OF ADMISSION:  07/18/2013  PRIMARY CARE PHYSICIAN:  Ellamae Sia, MD  REFERRING PHYSICIAN:  Algernon Huxley, MD  CHIEF COMPLAINT:  High potassium of 6.6 today.  HISTORY OF PRESENT ILLNESS: A 54 year old African American female with a history of ESRD on dialysis, hypertension, diabetes, CAD, came to the hospital for PermCath procedure. The patient has AV graft malfunction, was scheduled to have PermCath placement. The patient, got PermCath placement in procedure room but was noted to have a high potassium at 6.6. Dr. Lucky Cowboy requests hospitalist for admission. The patient only complains of nausea but denies any other symptoms.   PAST MEDICAL HISTORY: Hypertension, diabetes, CAD, ESRD on dialysis, depression, anxiety, gastroparesis, anemia, hyperlipidemia.   PAST SURGICAL HISTORY: AV graft and PermCath placement.   FAMILY HISTORY:  Mother has diabetes and brother has lung cancer.   SOCIAL HISTORY: Smokes 1/2 pack a day for many years. Denies any alcohol drinking or illicit drugs.   ALLERGIES: TO TAPE, ACE INHIBITOR, CODEINE, OXYCODONE, OXYCONTIN, PROMETHAZINE.   HOME MEDICATION:  1.  Renvela carbonate 800 mg p.o. t.i.d.  2.  Ranitidine 150 mg p.o. daily.  3.  Plavix 75 mg p.o. daily.  4.  Zofran 4 mg p.o. twice a day p.r.n.  5.  Novolin-R 100 injectable p.r.n.   6.  Lopressor 50 mg p.o. b.i.d.  7.  Reglan 5 mg p.o. 4 times a day.  8.  Levemir 6 units subcutaneous once a day at bedtime.  9.  Imdur 60 mg p.o. once a day.  10.  Citalopram 20 mg p.o. daily.  11.  Cetirizine 10 mg p.o. once a day.  12.  Aspirin 81 mg p.o. daily.  13.  Norvasc 10 mg p.o. daily.  14.  Percocet 325 mg/5 mg p.o. tablets every 2 hours p.r.n.   REVIEW OF SYSTEMS: CONSTITUTIONAL: The patient denies any fever or chills. No headache or dizziness. No weakness.  EYES: No double vision or blurry vision. EARS, NOSE, THROAT: No postnasal  drip, slurred speech or dysphagia.  CARDIOVASCULAR: No chest pain, palpitation, orthopnea or nocturnal dyspnea. No leg edema.  PULMONARY: No cough, sputum, shortness of breath. No hemoptysis.  GASTROINTESTINAL: No abdominal pain but has nausea. No vomiting or diarrhea. No melena or bloody stool.  GENITOURINARY: No dysuria, hematuria or incontinence.  SKIN: No rash or jaundice.  NEUROLOGY: No syncope, loss of consciousness. No seizure.  HEMATOLOGY: No easy bruising or bleeding.  ENDOCRINE: No polyuria, polydipsia, heat or cold intolerance.   PHYSICAL EXAMINATION: VITAL SIGNS: Temperature 98.5, blood pressure 161/76, pulse 59, O2 saturation 96% on room air.  GENERAL: The patient is alert, awake, oriented, in no acute distress.  HEENT: Pupils round, equal and reactive to light and accommodation. Moist oral mucosa. Clear oropharynx. No discharge from ear or nose.   NECK: Supple. No JVD or carotid bruit. No lymphadenopathy. No thyromegaly.  CARDIOVASCULAR: S1, S2 regular rate and rhythm. No murmurs or gallops.  PULMONARY: Bilateral air entry. No wheezing or rales. No use of accessory muscle to breathe.  ABDOMEN: Soft. No distention. No tenderness. No organomegaly. Bowel sounds present.  EXTREMITIES: No edema, clubbing or cyanosis. No calf tenderness. Strong bilateral pedal pulses.  SKIN: No rash or jaundice.  NEUROLOGY: A and O x 3. No focal deficit. Power 5/5. Sensory intact.   LABORATORY DATA: WBC 7.0, hemoglobin 10.3, platelets 176. Glucose 213, BUN 52, creatinine 9.58, sodium 129, potassium  6.5, chloride 98, bicarb 24, anion gap 7, original  potassium is 6.6.   IMPRESSIONS: 1.  Hyperkalemia.  2.  End-stage renal disease, on dialysis.  3.  Hypertension.  4.  Diabetes.  5.  Coronary artery disease.  6.  Anemia.   PLAN OF TREATMENT: 1.  The patient has hyperkalemia, high risk for cardiac arrest. I called Dr. Holley Raring and Dr. Holley Raring is arranging for urgent dialysis. We will follow up BMP.   2.  Will continue hypertension medication.  3.  For diabetes, we will start a sliding scale.  4.  The patient's CODE STATUS was DO NOT RESUSCITATE but the patient wants full code from now on.  5.  Tobacco abuse. Smoking cessation was counseled for 6 minutes.   I discussed the patient's condition and plan of treatment with the patient.   TIME SPENT: About 48 minutes.    ____________________________ Demetrios Loll, MD qc:cs D: 07/18/2013 18:57:00 ET T: 07/18/2013 19:36:51 ET JOB#: [id number]  cc: Demetrios Loll, MD, <Dictator> Demetrios Loll MD ELECTRONICALLY SIGNED 07/20/2013 18:18

## 2015-01-12 NOTE — Discharge Summary (Signed)
PATIENT NAMEELROSE, [name] MR#:  [id number] DATE OF BIRTH:  27-Jul-1961  DATE OF ADMISSION:  11/03/2012 DATE OF DISCHARGE:  11/07/2012  PRIMARY CARE PHYSICIAN: Robin Goodie, MD  DISCHARGE DIAGNOSES: 1. Colitis. 2. Gastroparesis. 3. Hypertension. 4. Diabetes. 5. Coronary artery disease. 6. Hyperlipidemia. 7. End stage renal disease, on dialysis.   CONDITION: Stable.   CODE STATUS: FULL CODE.   HOME MEDICATIONS: Please refer to the [facility] discharge instruction medication reconciliation list. New medications: Flagyl 500 mg p.o. b.i.d. for 5 days, Cipro 500 mg p.o. b.i.d. for 5 days.   DIET: Low sodium, low fat, low cholesterol, ADA diet and renal diet.   ACTIVITY: As tolerated.   FOLLOWUP CARE: With PCP within 1 to 2 weeks. Follow up with Dr. Gustavo [name], GI, within 1 to 2 weeks for possible colonoscopy. In addition, the patient needs to follow up with Dr. Candiss [name] for hemodialysis   REASON FOR ADMISSION: Abdominal pain, nausea, vomiting and diarrhea.   HOSPITAL COURSE:  1. The patient is a 54 year old African American female with a history of ESRD, hypertension, diabetes and gastroparesis who presented to the ED with 4 to 5 day history of abdominal pain, nausea, vomiting and diarrhea. The patient's CAT scan of abdomen showed colitis. For detailed history and physical examination, please refer to the admission note dictated by Dr. Verdell [name]. After admission the patient has been treated with Cipro and Flagyl IVPB with Dilaudid p.r.n. for abdominal pain and Zofran and Phenergan p.r.n. Dr. Gustavo [name] evaluated the patient and suggested continue current treatment for colitis and may get a colonoscopy after the patient's symptoms improve and follow up with him as outpatient. The patient's C. diff test is negative. Stool culture is also negative. The patient's symptoms have much improved after aforementioned treatment. She has no abdominal pain, nausea, vomiting or diarrhea. She tolerates diet well.   2. For ESRD, the patient has been on dialysis.  3. For diabetes, the patient has been treated with sliding scale.   The patient is clinically stable and will be discharged to home today. I discussed the patient's discharge plan with the patient and the case manager.   TIME SPENT: About 35 minutes. ____________________________ Robin Loll, MD qc:sb D: 11/07/2012 16:12:42 ET T: 11/08/2012 10:49:59 ET JOB#: [id number]  cc: Robin Loll, MD, <Dictator> Robin Loll MD ELECTRONICALLY SIGNED 11/08/2012 17:05

## 2015-01-12 NOTE — Consult Note (Signed)
Chief Complaint:  Subjective/Chief Complaint The patrient reports keeping her food down but had 3 loose bowel movements yesterday. She also reports abd pain that started last night. No reports of any vomiting.   VITAL SIGNS/ANCILLARY NOTES: **Vital Signs.:   13-Jul-14 04:00  Vital Signs Type Routine  Temperature Temperature (F) 97.8  Celsius 36.5  Temperature Source oral  Pulse Pulse 55  Respirations Respirations 18  Systolic BP Systolic BP 470  Diastolic BP (mmHg) Diastolic BP (mmHg) 68  Mean BP 96  Pulse Ox % Pulse Ox % 99  Pulse Ox Activity Level  At rest  Oxygen Delivery Room Air/ 21 %   Brief Assessment:  GEN well developed, well nourished, no acute distress   Respiratory normal resp effort   Additional Physical Exam Alert and orientated times 3   Lab Results: Routine Chem:  12-Jul-14 04:45   Glucose, Serum  103  BUN 15  Creatinine (comp)  5.24  Sodium, Serum 136  Potassium, Serum 3.8  Chloride, Serum 100  CO2, Serum 32  Calcium (Total), Serum  8.3  Anion Gap  4  Osmolality (calc) 273  eGFR (African American)  10  eGFR (Non-African American)  9 (eGFR values <66m/min/1.73 m2 may be an indication of chronic kidney disease (CKD). Calculated eGFR is useful in patients with stable renal function. The eGFR calculation will not be reliable in acutely ill patients when serum creatinine is changing rapidly. It is not useful in  patients on dialysis. The eGFR calculation may not be applicable to patients at the low and high extremes of body sizes, pregnant women, and vegetarians.)  Routine Hem:  12-Jul-14 04:45   WBC (CBC) 4.6  RBC (CBC)  3.73  Hemoglobin (CBC)  11.3  Hematocrit (CBC) 35.2  Platelet Count (CBC) 157  MCV 94  MCH 30.2  MCHC 32.0  RDW  16.4  Neutrophil % 31.1  Lymphocyte % 47.1  Monocyte % 11.3  Eosinophil % 8.7  Basophil % 1.8  Neutrophil # 1.4  Lymphocyte # 2.2  Monocyte # 0.5  Eosinophil # 0.4  Basophil # 0.1 (Result(s) reported on 02 Apr 2013 at 05:13AM.)   Assessment/Plan:  Assessment/Plan:  Assessment C. diff colitis.   Plan The patient was doing well yesterday but now reports some increased abd pain with 3 bowel movements yesterday. She will continue the current treatment as the antibiotics have not had enough time to work.   Electronic Signatures: WLucilla Lame(MD)  (Signed 13-Jul-14 07:50)  Authored: Chief Complaint, VITAL SIGNS/ANCILLARY NOTES, Brief Assessment, Lab Results, Assessment/Plan   Last Updated: 13-Jul-14 07:50 by WLucilla Lame(MD)

## 2015-01-12 NOTE — Op Note (Signed)
PATIENT NAME:  [name], [name] MR#:  [id number] DATE OF BIRTH:  09-10-1961  DATE OF PROCEDURE:  08/24/2013  PREOPERATIVE DIAGNOSES: 1.  End-stage renal disease requiring hemodialysis.  2.  Complication renal dialysis device with thrombosis and nonsalvage of right arm brachial axillary graft.   POSTOPERATIVE DIAGNOSES: 1.  End-stage renal disease requiring hemodialysis.  2.  Complication renal dialysis device with thrombosis and nonsalvage of right arm brachial axillary graft.   PROCEDURE PERFORMED: Creation of left arm brachial axillary dialysis graft with 4 to 7 mm tapered Propaten graft.   SURGEON: Hortencia Pilar, M.D.   ANESTHESIA: General by LMA.   FLUIDS: Per anesthesia record.   ESTIMATED BLOOD LOSS: Minimal.   SPECIMEN: None.   INDICATIONS: [name] is a 54 year old woman who has been able to utilize a right arm graft for many years. Recently, we were unable to salvage this. She underwent placement of a catheter and she is now returning for creation of a new upper arm access. Risks and benefits were reviewed, all questions answered. The patient agrees to proceed.   DESCRIPTION OF PROCEDURE: The patient is taken to special procedures and placed in the supine position. After adequate sedation is achieved, she is positioned supine with her left arm extended palm upward. The left arm is prepped and draped in sterile fashion. Appropriate timeout is called.   A small straight incision is made over the brachial impulse just above the antecubital crease as well as in the anterior axillary line. Dissection is carried down through the soft tissues to expose the fascia at both locations. Bovie cautery is used and hemostasis easily achieved. Over the brachial impulse the fascia is opened, brachial artery is identified. It is subsequently skeletonized over a short segment and looped proximally and distally with Silastic vessel loops. In a similar fashion, the fascia is opened in the axillary  incision and the axillary vein is identified, looped proximally and distally with Silastic vessel loops.   Gore tunneler is then advanced from the arterial incision to the venous incision and a 4 to 7 mm tapered PTFE Propaten graft is pulled subcutaneously through the tunnel. Tunneling device is removed. The graft is adjusted for appropriate length and beveled slightly, at the arterial end. Control of the brachial artery is achieved with Silastic vessel loops and the artery is delivered into the operative field.  An 11 blade is used to incise the artery and the arteriotomy is extended with Potts scissors; 6-0 Prolene stay sutures are placed and subsequently an end graft to side brachial artery anastomosis is fashioned with running CV-6 suture. Flushing maneuvers are performed and flow is established first through the graft and then this is clamped and then downstream. Palpable radial pulse is maintained. The lie of the graft is checked now that it has been pressurized and it is nicely superficial and with a good contour. The graft is then irrigated with approximately 60 mL of heparinized saline and clamped just above the arterial suture line.   Graft is approximated to the vein in the vein's native bed and then marked with a marker. The vein is delivered into the surgical field and controlled with Silastic vessel loops. Venotomy is made and stay sutures are placed. Graft is then beveled to the appropriate length and an end graft to side axillary vein anastomosis is fashioned with CV-6 suture. Flushing maneuvers are performed and flow is established through the graft into the central venous system. Excellent thrill is noted. Radial pulse is maintained.  Both wounds are then irrigated with sterile saline. Hemostasis is achieved with Bovie cautery. Surgicel is placed around the venous anastomosis and the wounds are then closed in layers using 3-0 Vicryl for several layers followed by 4-0 Monocryl subcuticular  and then Dermabond. The patient tolerated the procedure well. There were no immediate complications. Sponge and needle counts were correct and she was taken to the recovery area in excellent condition.   ____________________________ Katha Cabal, MD ggs:cs D: 08/24/2013 17:17:06 ET T: 08/24/2013 19:44:51 ET JOB#: [id number]  cc: Katha Cabal, MD, <Dictator> Katha Cabal MD ELECTRONICALLY SIGNED 08/29/2013 17:21

## 2015-01-13 NOTE — Op Note (Signed)
PATIENT NAME:  [name], [name] MR#:  [id number] DATE OF BIRTH:  13-Jan-1961  DATE OF PROCEDURE:  12/13/2013  PREOPERATIVE DIAGNOSES: 1. Left hand pain secondary to steal syndrome.  2. Complication arteriovenous hemodialysis access.  3. End-stage renal disease requiring hemodialysis.  4. Morbid obesity.   POSTOPERATIVE DIAGNOSES:  1. Left hand pain secondary to steal syndrome.  2. Complication arteriovenous hemodialysis access.  3. End-stage renal disease requiring hemodialysis.  4. Morbid obesity.   PROCEDURES PERFORMED: 1. Left arm angiography, third order catheter placement.  2. Percutaneous transluminal angioplasty of the distal revascularization-interval ligation bypass graft to 5 mm with Lutonix balloon.  3. Percutaneous transluminal angioplasty of the venous anastomosis, left arm brachial axillary dialysis graft to 8 mm.  4. Introduction catheter into AV graft.  5. Closure of arterial puncture with StarClose device.   PROCEDURE PERFORMED BY:  Katha Cabal.  SEDATION: Versed 4 mg plus fentanyl 150 mcg administered IV. Continuous ECG, pulse oximetry and cardiopulmonary monitoring is performed throughout the entire procedure by the interventional radiology nurse. Total sedation time was one hour.   ACCESS: A 6 French sheath, right common femoral artery.   FLUOROSCOPY TIME: 6.2 minutes.   CONTRAST USED: Isovue 55 mL.   INDICATIONS: Ms. Cowing is a 54 year old woman currently maintained on hemodialysis via a left arm brachial axillary dialysis graft. In the past she has had problems with steal syndrome and her symptoms have returned with intense left hand pain. Risks and benefits for angiography and intervention were reviewed. All questions answered. The patient has agreed to proceed.   DESCRIPTION OF PROCEDURE: The patient is taken to the special procedure suite, placed in the supine position with her left arm extended palm upward. After adequate sedation is achieved, both groins  are prepped and draped in sterile fashion. Ultrasound is placed in a sterile sleeve. Ultrasound is utilized due to lack of appropriate landmarks and to avoid vascular injury.   Under direct ultrasound visualization, the right common femoral artery is identified. It is echolucent and pulsatile indicating patency. Image is recorded for the permanent record. Lidocaine 1% is then infiltrated in the soft tissues under direct ultrasound visualization, a micropuncture needle is inserted into the anterior wall of the common femoral artery with continuous ultrasound guidance. Microwire followed by microsheath, J-wire followed by a 5 French sheath and 5 French pigtail catheter.   The pigtail catheter is then positioned in the ascending aorta and an LAO projection of the aortic arch is obtained. Pigtail catheter is exchanged over a stiff angled glide wire for an H1 catheter and the left subclavian artery is engaged. Wire catheter are negotiated into the distal one third of the subclavian and left arm runoff is obtained. The catheter is then advanced to the mid brachial and a steep oblique angle with magnified imaging and hand injection is then obtained to demonstrate the arterial anastomosis or that is the proximal anastomosis of the brachial to radial  artery bypass, i.e. the DRIL bypass. This image demonstrates a string sign at the level of the anastomosis proximally. The remaining portion of the graft measures approximately 4 mm in diameter and is widely patent including the distal anastomosis to the bifurcation of the radial and ulnar arteries. Distally, there is some slow flow, but the radial and ulnar arteries appear patent down to the level of the wrist.   Glidewire and catheter are then negotiated into the DRIL bypass across the stenosis. Heparin 4000 units is given. A Spartacore wire is then  advanced through the catheter and the catheter removed. A 5 French sheath is removed and a 6 [place] shuttle sheath is  advanced up and over the wire and positioned with its tip approximately 2 cm proximal to the anastomosis.   Initially, a 3 x 4 balloon and then a 5 x 4 Lutonix balloon are used to angioplasty this proximal lesion within the bypass graft. Inflation is to 12 atmospheres. The 3 mm balloon is at one minute, inflation, the Lutonix balloon is approximately two minutes 10 seconds. Follow-up imaging demonstrates there is now wide patency of the proximal anastomosis with minimal residual stenosis. Distal runoff is preserved.   Attention is then turned to the AV graft, which by duplex ultrasound demonstrates a high-grade stenosis at its venous anastomosis. The Spartacore wire is then negotiated down the brachial artery and into the AV graft with the assistance of a 150 straight slip catheter. Straight slip catheter is positioned in the mid AV graft, and hand injection of contrast is used in the opposite oblique to demonstrate the venous anastomosis which does indeed show a 70% to 80% stenosis. Spartacore wire is then reintroduced through the slip catheter and out into the central venous system, slip catheter is removed, and an 8 x 4 [place] balloon is advanced over the wire. The shuttle sheath is advanced down to the distal brachial artery as well. The balloon is then inflated to 16 atmospheres for approximately two minutes. Follow-up imaging through the sheath demonstrates that there is now wide patency of the venous anastomosis with minimal residual stenosis. Central venous anatomy is preserved. In the proximal axillary there is an area of very mild narrowing perhaps a 30% lesion. This will continue to be followed with ultrasound, but  does not require treatment at this time.   The sheath is then exchanged for a 6 French 11 cm sheath. The wire is pulled back into the aorta. Oblique view of the right groin is obtained and a StarClose device deployed. There were no immediate complications.   INTERPRETATION: The arch is  opacified with a bolus injection of contrast. It demonstrates a type I arch. The great vessels all are widely patent. Subclavian is widely patent as is the axillary. The brachial artery is widely patent down to the AV graft where there is a cut off consistent with a history of DRIL procedure. The brachial to radial bypass grafting, again consistent with DRIL does fill, and on a magnified oblique view of the arterial there is a string sign, greater than 80% to 90% stenosis in a focal lesion. Distally, the vein graft appears to be patent, and of good caliber and in the distal anastomosis fills the radial and ulnars, which are patent.   Following angioplasty to 5 mm with a Lutonix balloon, there is wide patency of the proximal anastomosis of the brachial to radial artery bypass.   Attention is then turned, catheter is negotiated from the arterial system into the AV graft where the venous anastomosis is interrogated and found to have a high-grade stenosis. This is treated with an 8 mm balloon with success.   SUMMARY: Successful salvage of both the DRIL bypass as well as the brachial axillary AV graft on the left.   ____________________________ Katha Cabal, MD ggs:sg D: 12/13/2013 11:45:30 ET T: 12/13/2013 12:11:17 ET JOB#: [id number]  cc: Katha Cabal, MD, <Dictator> Ellamae Sia, MD [place] MD ELECTRONICALLY SIGNED 12/13/2013 18:50

## 2015-01-13 NOTE — Op Note (Signed)
PATIENT NAME:  [name], [name] MR#:  [id number] DATE OF BIRTH:  Oct 19, 1960  DATE OF PROCEDURE:  09/14/2013  PREOPERATIVE DIAGNOSES:  1.  Ischemic left hand.  2.  Steal syndrome secondary to dialysis device.  3.  End-stage renal disease requiring hemodialysis.  4.  Complication dialysis device.   POSTOPERATIVE DIAGNOSES: 1.  Ischemic left hand.  2.  Steal syndrome secondary to dialysis device.  3.  End-stage renal disease requiring hemodialysis.  4.  Complication dialysis device.   PROCEDURE PERFORMED:  1.  Left arm distal revascularization with interval ligation (DRIL) for treatment of steal syndrome.  2.  Axillary artery to distal brachial artery bypass, left arm using reversed saphenous vein.   SURGEON: Dr. Delana Meyer.  CO-SURGEON: Dr. Lucky Cowboy.   ANESTHESIA: General by endotracheal intubation.   FLUIDS: Per anesthesia record.   ESTIMATED BLOOD LOSS: 75 mL.   SPECIMEN: None.   INDICATIONS: [name] is a 54 year old woman, who recently underwent creation of a left arm brachial axillary dialysis graft because her right arm brachial axillary dialysis graft had failed. Postoperatively, she has developed severe pain in her left hand and is losing motor function. She is therefore undergoing a DRIL procedure to correct steal phenomenon. The brachioaxillary dialysis access was created with a 4 to 7 mm tapered graft and I do not believe banding would be appropriate under those circumstances. Risks and benefits as well as alternative therapies were reviewed. All questions answered. The patient has agreed to proceed.   DESCRIPTION OF PROCEDURE: The patient is taken to the operating room and placed in the supine position. After adequate general anesthesia is induced and appropriate invasive monitors are placed, she is positioned supine with her left arm extended and her right leg in a frog-leg position. The right thigh and left arm was then prepped and draped in sterile fashion. Working simultaneously, Dr.  Lucky Cowboy excised the saphenous vein through a linear incision. Nothing unusual was encountered. Saphenous vein appeared adequate for bypass. The saphenofemoral junction was taken down and oversewn with Prolene suture.   During that time, the distal brachial artery was exposed through a curvilinear incision across the antecubital fossa and then subsequently the distal axillary artery was exposed through a linear incision more proximally on the arm along the anterior axillary line. Tunnel was then created with a large clamp. Heparin 4000 units was given. The vein was reversed. It was marked. Vascular control was obtained with Silastic vessel loops of the axillary artery. Arteriotomy was made, extended with Potts scissors. A 6-0 Prolene stay sutures were placed and the vein was spatulated and an end vein to side axillary artery anastomosis was fashioned with running 6-0 Prolene suture. Excellent pulsatile blood flow was noted through the vein and after adequate flushing, flow was re-established distally into the AV access. The vein was then pulled subcutaneously in the previously dissected tunnel.   Ethibond 0 was used to ligate the brachial artery proximally. Arteriotomy was then made after control distally with Silastic vessel loop and extended with Potts scissors. A 6-0 Prolene was placed. The vein was trimmed for appropriate length and then spatulated and an end vein to side distal brachial artery anastomosis was fashioned using running 6-0 Prolene. Again, reversal of flow was negated after ligation with the 0 Ethibond.   It was slightly difficult to feel the distal pulse and therefore a transverse venotomy was made in the vein graft and a #2 Fogarty was easily passed. The anastomosis and distal brachial artery sounded to  4.5 mm. This venotomy was then closed using running 6-0 Prolene.   Both wounds on the arm were irrigated out with sterile saline and 2 mL of  Evicel was placed around the suture lines. The  wound in the thigh was irrigated and then Evicel was placed along the bed of the wound. All three wounds were then closed using multiple layers of Vicryl beginning with 2-0 Vicryl and then 3-0 Vicryl. Skin was closed on the thigh with staples. Skin was closed with 4-0 Monocryl on the arm. Dermabond was applied to the arm for dressing and a Telfa and Tegaderm was applied to the leg. The patient tolerated the procedure well. There were no immediate complications. She was stable throughout the intraoperative course and was taken to the recovery room in good condition.   ____________________________ Katha Cabal, MD ggs:aw D: 09/14/2013 12:18:47 ET T: 09/14/2013 12:33:05 ET JOB#: [id number]  cc: Katha Cabal, MD, <Dictator> Katha Cabal MD ELECTRONICALLY SIGNED 09/27/2013 19:29

## 2015-01-13 NOTE — Op Note (Signed)
PATIENT NAME:  [name], [name] MR#:  [id number] DATE OF BIRTH:  24-May-1961  DATE OF PROCEDURE:  08/11/2014  PREOPERATIVE DIAGNOSES:  1. Critical stenosis of the bypass graft, left arm.  2. Complication of dialysis device.  3. Steal syndrome secondary to dialysis device, left hand.   PROCEDURES PERFORMED: 1. Arch aortogram.  2. Left upper extremity catheter placement, third order.  3. Percutaneous transluminal angioplasty distal revascularization and interval ligation  bypass.  4. Placement of Viabahn stent for failed angioplasty distal revascularization and interval ligation bypass.   PROCEDURE PERFORMED BY: Katha Cabal, M.D.   SEDATION: Versed plus fentanyl. Continuous ECG, pulse oximetry and cardiopulmonary monitoring is performed throughout the entire procedure by the interventional radiology nurse. Total sedation time is 1 hour 20 minutes.   ACCESS: A 6 French sheath, right common femoral artery.   FLUOROSCOPY TIME: 8.3 minutes.   CONTRAST USED: Isovue 80 mL.   INDICATIONS: Ms. Psencik is a 54 year old woman who presented to the office for followup of her left arm, access. Duplex ultrasound demonstrated a critical stenosis in the bypass, which is part of her DRIL procedure. The patient also notes she has been having increasing pain in her left hand over the last several weeks. The risks and benefits for angiography and intervention were reviewed. All questions answered. The patient agrees to proceed.   DESCRIPTION OF PROCEDURE: The patient is taken to the special procedure suite, placed in the supine position. After adequate sedation is achieved, both groins are prepped and draped in sterile fashion. Appropriate timeout is called. Ultrasound is placed in a sterile sleeve and the common femoral artery is identified. It is echolucent and pulsatile indicating patency. Image is recorded for the permanent record. Under real-time visualization, the artery is accessed with a micropuncture  needle, microwire followed by micro sheath, J-wire followed by a 6 French sheath. The pigtail catheter is then advanced into the ascending aorta and LAO projection of the arch is obtained. A stiff-angled Glidewire an H1 catheter are then used to engage the subclavian on the left and the catheter is advanced and serial angiography is performed. Wire and catheter are negotiated into the actual bypass of the DRIL procedure and magnified images are obtained. Critical stenosis is noted over approximately a 2 cm segment and 4000 units of heparin are given. Versacore wire is then advanced through the lesion and a 90 cm 6 [place] shuttle catheter is advanced over the wire. Initially, a 4 x 4 Lutonix and then a 5 x 4, subsequently another 4 x 6 is used to extend down into the distal brachial artery. After serial angioplasties as described above, there is an area of residual narrowing stenosis which is not responding to repeated angioplasty and, therefore, a wire is advanced and subsequently a 6 x 2.5 Viabahn stent is placed across this lesion and post dilated with a 5 x 3 balloon. Excellent angiographic result is noted. Distal runoff is then obtained, which demonstrates patency of the radial and ulnar artery.   The sheath is pulled back into the right external and oblique view of the groin is obtained. StarClose device deployed.   INTERPRETATION: The aortic arch is opacified and demonstrates widely patent with normal anatomy of the great vessels, no significant ostial stenosis.   The left subclavian is widely patent throughout its course and fills down to the distal brachial where the AV graft is noted, which demonstrates a widely patent anastomosis and the bypass for the DRIL procedure which demonstrates a  critical stenosis in its distal portion just before the distal anastomosis, 2 vessel runoff is noted.   Following repeated angioplasty, as described above, there was still significant residual stenosis and a  Viabahn stent is deployed. After post dilatation there is an excellent result. There are no immediate complications.   SUMMARY: Successful salvage of left arm DRIL bypass for treatment of ischemic symptoms to the left hand.    ____________________________ Katha Cabal, MD ggs:bm D: 08/11/2014 18:49:24 ET T: 08/11/2014 22:57:13 ET JOB#: [id number]  cc: Katha Cabal, MD, <Dictator> Katha Cabal MD ELECTRONICALLY SIGNED 08/22/2014 13:00

## 2015-01-13 NOTE — Op Note (Signed)
PATIENT NAME:  [name], [name] MR#:  [id number] DATE OF BIRTH:  [birth year]  DATE OF PROCEDURE:  08/04/2014  DATE OF BIRTH: 04/20/1951.   DATE OF PROCEDURE: 08/04/2014.   PREOPERATIVE DIAGNOSES:  1. Thrombosis, right arm brachial axillary dialysis graft.  2. End-stage renal disease requiring hemodialysis.  3. Steal syndrome secondary to the dialysis device.   POSTOPERATIVE DIAGNOSES: 1. Thrombosis, right arm brachial axillary dialysis graft.  2. End-stage renal disease requiring hemodialysis.  3. Steal syndrome secondary to the dialysis device. 4. Central venous stenosis.   PROCEDURES PERFORMED:  1. Contrast injection of left arm brachial axillary dialysis graft.  2. Percutaneous transluminal angioplasty and stent placement, left subclavian vein.  3. Percutaneous transluminal angioplasty and stent placement, venous anastomosis left arm brachial axillary dialysis graft.  4. Mechanical thrombectomy using Trerotola device, left arm brachial axillary dialysis graft.   PROCEDURE PERFORMED BY: Katha Cabal, MD   SEDATION: Versed 4 mg plus fentanyl 250 mcg administered IV. Continuous ECG, pulse oximetry and cardiopulmonary monitoring is performed throughout the entire procedure by the interventional radiology nurse. Total sedation time was 1 hour 20 minutes.   ACCESS:  1. A 7 French sheath, antegrade direction, left arm brachial axillary dialysis graft.  2. A 6 French sheath, retrograde direction, left arm brachial axillary dialysis graft.   CONTRAST USED: Isovue 60 mL.   FLUOROSCOPY TIME: 11.9 minutes.   INDICATIONS: [name] is a 54 year old woman who presented to the ER directly from the dialysis center with her cannulation needle still in her arm having been found to have a thrombosed access. The risks and benefits for angiography, thrombectomy, and intervention were reviewed. All questions answered. The patient agrees to proceed.   DESCRIPTION OF PROCEDURE: The patient is taken to  special procedures and placed in the supine position. After adequate sedation is achieved, she is positioned supine with her left arm extended palm upward. The left arm is prepped and draped in a sterile fashion. Appropriate timeout is called.   Lidocaine 1% is infiltrated in the soft tissues overlying the palpable graft near the arterial anastomosis in an antegrade direction, microneedle is inserted, microwire followed by micro sheath, J-wire followed by a 6 French sheath. Small hand injection of contrast verifies thrombus throughout the graft. Heparin 4000 units is given.   A Kumpe catheter and a floppy Glidewire are then negotiated past the anastomosis and a small hand injection in the distal axillary vein demonstrates occlusion of the mid axillary vein with extension of the narrowing and stricture into the graft. This is likely the lesion and cause of thrombosis; however, there is not ready flow of contrast through the subclavian and upon advancing the Kumpe over the wire further still there is occlusion of the subclavian vein at the level of the clavicular head. There is also some thrombus noted throughout the subclavian vein.   Catheter is then negotiated down into the superior vena cava and proximal atriocaval junction and hand injection of contrast demonstrates the cava, atrium, and proximal innominate vein on the left are all widely patent.   Given the occlusion of the subclavian, my thoughts were to consider possible HeRO graft in the future and, therefore, the Kumpe catheter was pulled back and directed upward. The wire and catheter were negotiated into the jugular vein representing a first-order catheter venous catheter placement. The catheter was then utilized to perform angiography of the jugular vein, which is widely patent from the angle of the jaw all the way down to  the innominate. There is a 50%-60% narrowing at the confluence of the jugular vein with the innominate.   The catheter is  then redirected  into the innominate SVC and a Magic torque wire is advanced. A 6 x 150 Lutonix balloon is then used to angioplasty the entire length of the subclavian through the anastomosis to encompass both lesions. These lesions are separate and distinct. Followup imaging demonstrates there is now flow through the area and I elected to her continue with thrombectomy. Trerotola device is then opened onto the field and multiple passes are made through the subclavian and the venous portion of the AV graft. Followup imaging demonstrates much of the thrombus has been removed; however, there is thrombus noted in the axillary vein and there is persistent narrowing of the subclavian vein such that the lumen is only 2-3 mm.   Lidocaine 1% is infiltrated in the soft tissue overlying the graft at the level of the deltoid and a retrograde cannulation is performed inserting a 6 [place] sheath. Trerotola device is then advanced out into the brachial artery, opened, and then pulled back into the graft and engaged. Multiple passes are made. Several more passes are made in the venous portion after this and flow is re-established by hand injection.   The wire is then reintroduced and attention is now directed toward the central venous stenosis. Angioplasty is then performed with a 7 mm x 8 cm balloon. The patient did not tolerate this very well, and the improvement was quite minimal. Angioplasty is also performed to 7 mm of the venous anastomosis and this did demonstrate modest improvement, but still was not acceptable. At this point, I performed magnified imaging of the confluence of the jugular and subclavian veins forming the innominate. There is a cul-de-sac there so that a stent would be purely within the innominate and not interfere with future jugular access and, therefore, I exchanged the 0.035 wire for an 0.018 wire after upsizing the sheath from 6 to 7 [place] in the antegrade direction, and then advanced an 8 x 100  Viabahn stent across the subclavian lesion. The stent is then deployed. I then opened a second 8 x 100 and deployed this across the axillary lesion. I needed extend his using an 8 x 2.5 cm Viabahn. Having achieved this, all 3 stents were then postdilated with an 8 mm balloon. Rapid inflations to 14 atmospheres were performed. With the balloon in the venous outflow, reflux of contrast was performed demonstrating patency of the arterial portion of the graft in the brachial artery. Forward flow is then evaluated through the venous portion of the graft and into the axillary, subclavian, and innominate veins and these are now widely patent. The stents are fully expanded with brisk flow of blood. By palpation, her bypass for her DRIL procedure is still 4/4 palpable pulse.   The wire was then removed and both sheaths were pulled after 4-0 Monocryl mattress sutures were placed. The patient tolerated the procedure well and there were no immediate complications.   INTERPRETATION: Initial views demonstrate thrombus within the graft. Upon advancing a catheter into the central venous system, there is an occlusion of the subclavian over 4-5 cm. The jugular vein is patent with a moderate stenosis at its confluence with the innominate. The innominate and superior vena cava are widely patent.   Following thrombectomy there is patency of the AV graft. Following angioplasty of the subclavian as well as the venous anastomosis, there is high-grade residual stenosis and, therefore,  these areas are stented and post dilated to 8. There is now patency of the entire system with rapid flow of contrast into the central venous system.   SUMMARY: Successful salvage of left arm brachial axillary dialysis graft.    ____________________________ Katha Cabal, MD ggs:bm D: 08/04/2014 17:45:19 ET T: 08/04/2014 21:20:35 ET JOB#: [id number]  cc: Katha Cabal, MD, <Dictator> Ellamae Sia, MD [place] MD ELECTRONICALLY SIGNED 08/22/2014 12:59

## 2015-01-13 NOTE — Discharge Summary (Signed)
PATIENT NAME:  [name], HLAVACEK MR#:  [id number] DATE OF BIRTH:  1961-02-14  DATE OF ADMISSION:  12/02/2013 DATE OF DISCHARGE:  12/03/2013  DISCHARGE DIAGNOSES: 1.  Acute bronchitis, with chest pain.  2.  Gastroparesis.  3.  End-stage renal disease, on hemodialysis. 4.  Hypertension.  5.  Diabetes mellitus.  6.  Hyperlipidemia.   IMAGING STUDIES: Chest, PA and lateral, which showed no acute cardiopulmonary disease.   CONSULTANTS:  1.  Dr. Candiss Norse with nephrology.  2.  Dr. Humphrey Rolls with cardiology.   ADMITTING HISTORY, PHYSICAL AND HOSPITAL COURSE: Please see detailed H and P dictated by Dr. Anselm Jungling. In brief, a 54 year old African American female patient with history of end-stage renal disease on hemodialysis, diabetes, hypertension and tobacco abuse, presented to the hospital complaining of right-sided chest pain. The patient was found to have some wheezing, admitted to the hospitalist service. The patient's chest pain was thought to be secondary to acute bronchitis and noncardiac chest pain after being seen by Dr. Humphrey Rolls of cardiology. Cardiac enzymes were normal. EKG showed no acute changes. She was treated with nebulizers, IV steroids, with which her pain has improved. The patient did have some nausea and vomiting secondary to gastroparesis and she is being started on scheduled Reglan with which her symptoms have improved. No further nausea, vomiting, chest pain. The patient was seen by nephrology, had dialysis during the hospital stay and is being discharged back home in a stable condition to follow up with her primary care physician and Dr. Humphrey Rolls with cardiology. Prior  to discharge, the patient does have some minimal wheezing but no shortness of breath. Sats are in the normal range on room air on exertion.   DISCHARGE MEDICATIONS: 1.  Novolin sliding scale.  2.  Metoprolol tartrate 50 mg oral twice a day.  3.  Reglan 5 mg oral 3 times a day.  4.  Plavix 75 mg oral once a day.  5.  Meclizine 25 mg  oral 3 times a day as needed.  6.  Renvela 800 mg oral 3 times a day.  7.  Vitamin D3 400 international units oral once a day.  8.  Aspirin 81 mg daily.  9.  Amlodipine 10 mg daily.  10.  Vantin 150 mg oral once a day.  11.  Zofran 4 mg oral 2 times a day as needed.  12.  Renal capsule 1 capsule oral once a day.  13.  Nitrostat 0.4 sublingual as needed for chest pain.  14.  Levemir 8 units subcutaneous once a day at bedtime.  15.  Atorvastatin 20 mg oral once a day.  16.  Prednisone 60 mg tapered over 6 days.  17.  Proventil HFA 2 puffs inhaled 4 times a day as needed.   DISCHARGE INSTRUCTIONS: Carbohydrate-controlled renal diet. Activity as tolerated. Follow up with primary care physician in 1 to 2 weeks. Continue dialysis as before.   TIME SPENT ON DAY OF DISCHARGE: In discharge activity was 40 minutes.   ____________________________ Leia Alf Khira Cudmore, MD srs:cs D: 12/05/2013 12:38:00 ET T: 12/05/2013 20:45:50 ET JOB#: [id number]  cc: Ellamae Sia, MD Dr. Humphrey Rolls with cardiology [place] R. Rohit Deloria, MD, <Dictator>  Neita Carp MD ELECTRONICALLY SIGNED 12/17/2013 10:23

## 2015-01-13 NOTE — Op Note (Signed)
PATIENT NAME:  [name], [name] MR#:  [id number] DATE OF BIRTH:  Feb 20, 1961  DATE OF PROCEDURE:  10/27/2013  PREOPERATIVE DIAGNOSIS: End-stage renal disease with functional permanent dialysis access and no longer needing PermCath.   POSTOPERATIVE DIAGNOSIS: End-stage renal disease with functional permanent dialysis access and no longer needing PermCath.   PROCEDURE PERFORMED: Removal of left jugular PermCath.   SURGEON: [place], PA-C.   ANESTHESIA: Local.   ESTIMATED BLOOD LOSS: Minimal.   INDICATION FOR THE PROCEDURE: The patient is a 53 year old female with end-stage renal disease. Her access is functional, and she no longer needs her PermCath. This will be removed.   DESCRIPTION OF THE PROCEDURE: The patient is brought to the vascular interventional radiology area and positioned supine. The left neck and chest and existing catheter were sterilely prepped and draped and a sterile surgical field was created. The area was locally anesthetized copiously with 1% lidocaine. Hemostats were used to help dissect out the cuff. An 11 blade was used to transect the fibrous sheath connected to the cuff. The catheter was then removed in its entirety without difficulty with gentle traction. Pressure was held at the base of the neck. A sterile dressing was placed. The patient tolerated the procedure well.     ____________________________ Marin Shutter. Tammra Pressman, PA-C cnh:dmm D: 10/27/2013 09:25:35 ET T: 10/27/2013 10:02:37 ET JOB#: [id number]  cc: Marin Shutter. Shoua Ulloa, PA-C, <Dictator> Bryndon Cumbie N Victoriano Campion PA ELECTRONICALLY SIGNED 10/28/2013 8:59

## 2015-01-13 NOTE — Consult Note (Signed)
  MR#:  [id number] DATE OF BIRTH:  Jun 17, 1961  DATE OF CONSULTATION:  12/02/2013  REFERRING PHYSICIAN:   CONSULTING PHYSICIAN:  Dionisio David, MD  INDICATION FOR CONSULTATION: Chest pain.   HISTORY OF PRESENT ILLNESS: This is a 54 year old African American female with past medical history of coronary artery disease, COPD, hiatal hernia, esophageal reflux, end-stage renal disease, hypertension, mitral regurgitation and hyperlipidemia, who came into the Emergency Room with chest pain. She was sent from dialysis. She stated that she has been having some cough and right-sided chest pain and shortness of breath since yesterday. Today, she started having some left-sided chest pain, thus she came into the Emergency Room. On further talking to her, she actually in front of me started having right chest pain as well as back pain.   PAST MEDICAL HISTORY: History of coronary artery disease. She had a cardiac catheterization on 10/28/2011 that showed a normal left ventricular systolic function and subtotal right coronary artery had a stent placed at that time. Repeat cardiac catheterization on 02/17/2013 showed the stent in the proximal right coronary was patent with just mild restenosis and circumflex and LAD were normal. Her other past medical history is as mentioned above.   SOCIAL HISTORY: She denies EtOH abuse. She currently smokes 1 pack per day.   FAMILY HISTORY: Positive for coronary artery disease, renal failure and hypertension.  ALLERGIES: SHE HAS ALLERGIES TO ACE INHIBITORS, CODEINE AND OXYCODONE.   PHYSICAL EXAMINATION:  GENERAL: She is alert, oriented x 3, in no acute distress.  VITAL SIGNS: Stable.  NECK: No JVD.  LUNGS: Clear.  HEART: Regular rate and rhythm. Normal S1, S2. No audible murmur.  ABDOMEN: Soft, nontender, positive bowel sounds.  EXTREMITIES: No pedal edema.  NEUROLOGIC: She appears to be intact.   Her EKG shows normal sinus rhythm, no acute changes. She had an  echocardiogram done in the office on 11/10/2013, which showed ejection fraction of 62%, mildly dilated left atrium, mild to moderate tricuspid regurgitation, mild to moderate mitral regurgitation, mild pulmonic regurgitation and grade 1 diastolic dysfunction with normal wall motion.   ASSESSMENT AND PLAN: Noncardiac chest pain. The chest pain is mostly on the right side. Cardiac panel is negative. EKG is unremarkable. Chest x-ray is negative, most likely is some musculoskeletal pain or related to acid reflux. She is already on treatment for acid reflux and hiatal hernia. She can be discharged with a followup in the office next week.   Thank you very much for the referral.  ____________________________ Dionisio David, MD sak:aw D: 12/02/2013 11:48:57 ET T: 12/02/2013 12:17:30 ET JOB#: [id number]  cc: Dionisio David, MD, <Dictator> Dionisio David MD ELECTRONICALLY SIGNED 12/05/2013 12:15

## 2015-01-13 NOTE — Discharge Summary (Signed)
PATIENT NAME:  [name], [name] MR#:  [id number] DATE OF BIRTH:  21-Feb-1961  DATE OF ADMISSION:  09/15/2013 DATE OF DISCHARGE:  09/16/2013  DISCHARGE DIAGNOSES:  1. Ischemic hand secondary to dialysis graft.  2. Complication of dialysis device with steal syndrome.  3. End-stage renal disease, requiring hemodialysis.  SECONDARY DIAGNOSES: 1. Hypertension. 2. Anemia of chronic disease.  3. Secondary hyperparathyroidism secondary to end-stage renal disease.   PROCEDURES: Axillary to distal brachial bypass with interval ligation of the brachial artery, distal revascularization with interval ligation (DRIL) procedure on 09/14/2013.   CONSULTATIONS: Nephrology.   HISTORY: Ms. Rad is a 54 year old woman who underwent placement of a brachial axillary dialysis graft in the left arm. Subsequently, she has developed severe pain in her hand and physical examination as well as noninvasive studies consistent with steal syndrome. She has been vein mapped and found to have an adequate greater saphenous vein, and therefore will undergo DRIL procedure so that her access can be maintained and her dialysis treatments continued. The risks and benefits of this procedure were reviewed in detail with the patient. Alternative therapies were discussed, including ligation of the AV graft and continued catheter use. All questions were answered. The patient has elected to proceed with surgery.   HOSPITAL COURSE: On the day of admission, the patient underwent successful bypass using reversed greater saphenous vein harvested from the right thigh. Postoperatively, she did well with the exception of some persistent nausea from the anesthesia and some significant difficulty in managing her pain on oral agents alone. Because of this, she was held, and over the next 48 hours, both situations improved significantly. On postoperative day #2, she was tolerating her surgery quite well. She had a palpable radial pulse. Her hand was free of  pain, and she was noting pain only at the surgical sites, and this was well controlled with oral medications. She was also now tolerating diet, and her nausea had largely resolved. Nephrology was contacted on hospital day #1 so that her dialysis therapies could be maintained.   The patient was discharged to home on postoperative day #2. She is in improved condition. She is to continue her renal diet. Her home medications are unchanged, with the addition of Percocet for pain. Her activities are light activities utilizing the left arm. She will follow up with me in the office in approximately 10 to 14 days for staple removal.     ____________________________ Katha Cabal, MD ggs:lb D: 09/20/2013 09:09:47 ET T: 09/20/2013 09:27:55 ET JOB#: [id number]  cc: Katha Cabal, MD, <Dictator> [place] Lilian Kapur, MD Ellamae Sia, MD Katha Cabal MD ELECTRONICALLY SIGNED 09/27/2013 19:29

## 2015-01-13 NOTE — H&P (Signed)
PATIENT NAME:  [name], [name] MR#:  [id number] DATE OF BIRTH:  01-Apr-1961  DATE OF ADMISSION:  05/30/2014  PRIMARY CARE PHYSICIAN: Ellamae Sia, MD  REFERRING PHYSICIAN: Ahmed Prima, MD   CHIEF COMPLAINT: Abdominal pain, nausea, vomiting.   HISTORY OF PRESENT ILLNESS: [name] is a 54 year old female who comes to the Emergency Department with complaints of abdominal pain, nausea, vomiting. The patient states has been having diarrhea over the last 1 week. She took 1 dose of Imodium. Started to experience generalized body aches, nausea, vomiting. Concerning this, workup showed in the Emergency Department, the patient has acute pancreatitis consistent with a lipase of 1150. The patient is also found to have a potassium of 8.2. EKG shows sinus bradycardia with peaked T waves. The patient was given all temporizing measures in the Emergency Department.   PAST MEDICAL HISTORY:  1.  Coronary artery disease status post MI.  2.  Previous history of pancreatitis. 3.  Nausea, vomiting.  4.  Neuropathy.  5.  Chronic constipation.  6.  Irritable bowel syndrome.  7.  Osteoarthritis. 8.  End-stage renal disease on hemodialysis.  9.  Diabetic retinopathy.  10.  Diabetes mellitus, type 2.  11.  Gastroesophageal reflux disease. 12.  Hiatal hernia. 13.  Gastroparesis.   PAST SURGICAL HISTORY:  1.  Bilateral hand surgery.  2.  Cholecystectomy.  3.  Appendectomy.  4.  Hysterectomy.   SOCIAL HISTORY: Continues to smoke 1/2 pack a day. Occasional alcohol use; last drink of alcohol was on Friday.   FAMILY HISTORY: Mother with  on hemodialysis. Mother also has diabetes mellitus.   REVIEW OF SYSTEMS:  CONSTITUTIONAL: Experiencing generalized weakness.  EYES: No change in vision.  ENT: No change in hearing.  RESPIRATORY: No cough, shortness of breath.  CARDIOVASCULAR: No chest pain, palpitations.  GASTROINTESTINAL: Has nausea, vomiting, and diarrhea.   PHYSICAL EXAMINATION:  GENERAL: This is  a well-built, well-nourished, age-appropriate female lying down in the bed, not in distress.  VITAL SIGNS: Temperature 98, , blood pressure 158/73, respiratory rate of 18, oxygen saturation is 96% on room air.  HEENT: Head normocephalic. Eyes no scleral icterus. Conjunctivae normal. Pupils equal and react to light. Mucous membranes moist. No pharyngeal erythema.  NECK: Supple. No lymphadenopathy. No JVD. No carotid bruit.  CHEST: Has no focal tenderness.  LUNGS: Bilaterally clear to auscultation.  HEART: S1, S2 regular. No murmurs are heard.  ABDOMEN: Bowel sounds plus. Soft. Has tenderness in the epigastric area and the upper part of the abdomen. Could not appreciate hepatosplenomegaly.  EXTREMITIES: No pedal edema. Pulses 2+.  NEUROLOGIC: The patient is alert, oriented to place, person, and time. Cranial nerves II through XII intact. Motor 5/5 in upper and lower extremities.   LABORATORY DATA: CMP: BUN 57, creatinine of 11.56, sodium 124, potassium 8.2. The rest of the values are within normal limits.   ASSESSMENT AND PLAN: 1.  Hyperkalemia secondary to missed hemodialysis.  2.  Hyponatremia secondary to fluid overload. Continue the dialysis and follow up. Also, nausea might have contributed to this.  3.  End-stage renal disease on hemodialysis Monday, Wednesday, Friday schedule.  4.  Pancreatitis. Continue with intravenous fluids and follow up.  5.  Keep the patient on deep vein thrombosis prophylaxis with Lovenox.   TIME SPENT: 50 minutes.    ____________________________ Monica Becton, MD pv:ST D: 05/31/2014 01:01:12 ET T: 05/31/2014 01:55:22 ET JOB#: [id number]  cc: Monica Becton, MD, <Dictator> Monica Becton MD ELECTRONICALLY SIGNED 06/07/2014 21:35

## 2015-01-13 NOTE — Op Note (Signed)
PATIENT NAME:  [name], FIXICO MR#:  [id number] DATE OF BIRTH:  December 15, 1960  DATE OF PROCEDURE:  04/07/2014  PREOPERATIVE DIAGNOSES:  1. A complication of arteriovenous dialysis device.  2. Steal syndrome, left hand secondary to arteriovenous graft.  3. End-stage renal disease requiring hemodialysis.  4. Left upper extremity pain.   POSTOPERATIVE DIAGNOSES: 1. A complication of arteriovenous dialysis device.  2. Steal syndrome, left hand secondary to arteriovenous graft.  3. End-stage renal disease requiring hemodialysis.  4. Left upper extremity pain.   PROCEDURES PERFORMED:  1.  Arch aortogram.  2.  The left upper extremity angiography, third order catheter placement.  3.  Percutaneous transluminal angioplasty of the distal anastomosis of the distal revascularization with interval ligation bypass and the brachial artery using a 4 mm Lutonix balloon.  4.  Percutaneous transluminal angioplasty of the proximal anastomosis in the axillary artery using a 5 mm Lutonix balloon.   PROCEDURE PERFORMED BY: Katha Cabal, M.D.   SEDATION: Precedex drip.   FLUOROSCOPY TIME: 7.2 minutes.   CONTRAST USED: Isovue 90 mL.   INDICATIONS: Ms. Godfrey is a 54 year old woman maintained on hemodialysis who has been having increasing pain in her left upper extremity. Physical examination, as well as noninvasive studies demonstrated multiple stenoses.  One at the proximal anastomosis over DRIL bypass and one at the distal anastomosis extending into the distal brachial artery. The risks and benefits for angiography and intervention were reviewed. All questions answered. The patient has agreed to proceed.   DESCRIPTION OF PROCEDURE: The patient was taken to special procedures and placed in the supine position. After adequate sedation is achieved on a Precedex drip, she is positioned supine with her left arm extended palm upward. The left arm was prepped and draped in a sterile fashion.   Lidocaine 1% is  infiltrated in the soft tissues overlying the femoral impulse.  Ultrasound is placed in a sterile sleeve. Ultrasound is utilized secondary to lack of appropriate landmarks and to avoid vascular injury. Under direct ultrasound visualization, common femoral artery identified. It is pulsatile and echolucent indicating patency. Image is recorded for the permanent record and under real-time visualization, a microneedle is inserted, micro wire followed by micro sheath, J-wire followed by a 5 French sheath and a 5 French pigtail catheter.   Pigtail catheter is advanced into the ascending aorta and an LAO projection of the arch is obtained.   Pigtail catheter is exchanged over a stiff angled glide wire for an H1 catheter, and subsequently the subclavian artery is engaged. Hand injection of contrast demonstrates the subclavian and proximal axillary.  Wire is then reintroduced. The catheter is advanced out to the level of the humeral head and further hand injection is performed. Stenosis is noted within the DRIL bypass at the proximal anastomosis and 4000 units of heparin are given. Wire is introduced and negotiated into the bypass graft, and the catheter is then advanced down to the distal portion, where distal runoff is obtained, demonstrating a string sign in the distal anastomosis.   A stiff-angled Glidewire is then exchanged for a 300 Versacore wire and a 90 cm shuttle sheath is advanced up and over the wire and positioned with its tip in the mid bypass graft. A 4 x 4 Lutonix balloon is then advanced over the wire through the distal anastomosis extending into the brachial artery and angioplasty is performed to 14 atmospheres for 3 minutes. Follow-up imaging demonstrates moderate residual stenosis and a 5 x 2 balloon is advanced across  the lesion and inflated to 14 atmospheres for approximately 1-1/2 minutes. Follow-up imaging demonstrates an excellent result, less than 15% residual stenosis, and preservation of  distal runoff.   The sheath is then repositioned into the axillary artery and the proximal anastomosis is imaged in magnified view.  A 5 x 4 Lutonix balloon is then advanced over the wire, positioned across the anastomosis and inflated to 14 atmospheres for 3 minutes. Follow-up imaging demonstrates an excellent result. In the follow-up image, you can also see the venous anastomosis of the graft, which appears to have a smooth tapered narrowing of approximately 30%.  This does not appear to be hemodynamically significant at this time.   The sheath is then pulled back into the external iliac. Oblique view is obtained and a StarClose device deployed. There are no immediate complications.   INTERPRETATION: The arch is a type I arch. There are no ostial stenoses of the great vessels. The subclavian and axillary arteries appear widely patent. Brachial artery is patent in its proximal 2/3, filling a brachial axillary AV graft, which is patent. Modest narrowing at the venous anastomosis is noted as described above. This does not appear to need to be treated at this time.   The DRIL bypass, which extends from the distal axillary artery to the distal brachial, demonstrates high-grade stenoses of both anastomoses, worse in the distal than the proximal. There is 3-vessel runoff in the forearm. Following angioplasty, first with the 4 mm Lutonix distally and then a 5 x 2 nondrug-eluting balloon, there is resolution of the distal anastomotic lesion following angioplasty with a 5 x 4 Lutonix balloon proximally. There is resolution of the proximal anastomotic lesion.   SUMMARY: Successful treatment of the DRIL bypass for preservation of the patient's DRIL and AV graft as described above.   ____________________________ Katha Cabal, MD ggs:ts D: 04/07/2014 16:47:00 ET T: 04/07/2014 23:11:44 ET JOB#: [id number]  cc: Katha Cabal, MD, <Dictator> Munsoor Lilian Kapur, MD Katha Cabal MD ELECTRONICALLY  SIGNED 04/18/2014 12:23

## 2015-01-13 NOTE — H&P (Signed)
PATIENT NAME:  [name], [name] MR#:  [id number] DATE OF BIRTH:  [birth year]  DATE OF ADMISSION:  12/02/2013  PRIMARY CARE PHYSICIAN: Dr. Quay Burow  PRIMARY CARDIOLOGIST: Dr. Neoma Laming  REFERRING EMERGENCY ROOM PHYSICIAN: Dr. Harvest Dark  CHIEF COMPLAINT: Chest pain.   HISTORY OF PRESENT ILLNESS: This is a 54 year old African American female with history of end-stage renal disease on hemodialysis Monday, Wednesday, and Friday, hypertension, diabetes, and coronary artery disease status post stent twice, who came to hospital from dialysis center because of complaint of chest pain. The patient says that she had pain that started from her back and coming in front to her chest. She had this little dull pain in her chest for the last 1 day. Also had some cough and some sticky dark sputum for the last 2 to 3 days, and she had some feeling of chills yesterday, but did not check the temperature. Today morning she went for her hemodialysis and as she was started on that she started feeling the chest pain, which was on the left side of the chest, and somewhat shortness of breath, so dialysis center stopped the dialysis, started her on oxygen and sent her to the Emergency Room for further management. On further questioning, she said that the pain is vague and has no correlation with activity. She had similar pain 1 year ago, and she was admitted for that, and she had cardiac catheterization done. She also has stent in the past. Today the pain was dull, aching type, on the left side of the chest, nonradiating, and she graded it 5 to 6 out of 10.   REVIEW OF SYSTEMS: CONSTITUTIONAL: Negative for fever, fatigue, weakness, pain, or weight loss.  EYES: No blurring, double vision, discharge, or redness.  EARS, NOSE, THROAT: No tinnitus, ear pain, or hearing loss.  RESPIRATORY: The patient has some cough and some sputum, but denies any wheezing or painful respirations.  CARDIOVASCULAR: Has some chest pain, but no  orthopnea, edema, arrhythmia, or palpitations.  GASTROINTESTINAL: No nausea, vomiting, diarrhea, abdominal pain.  GENITOURINARY: No dysuria, hematuria, or increased frequency.  ENDOCRINE: No increased sweating. No heat or cold intolerance.  SKIN: No acne, rashes, or lesions.  MUSCULOSKELETAL: No pain or swelling in the joints.  NEUROLOGICAL: No numbness, weakness, tremor, or vertigo.  PSYCHIATRIC: No anxiety, insomnia, bipolar disorder.   PAST MEDICAL HISTORY: 1.  Hypertension.  2.  Diabetes.  3.  Coronary artery disease.  4.  End-stage renal disease, on hemodialysis.  5.  Depression.  6.  Anxiety.  7.  Gastroparesis.  8.  Anemia of chronic renal failure.  9.  Hyperlipidemia.   PAST SURGICAL HISTORY: AV graft and PermCath placement in the past.   FAMILY HISTORY: Mother has diabetes. Brother has lung cancer.   SOCIAL HISTORY: Smokes half pack every day for many years. She claims that she is trying to cut down. Denies any alcohol drinking or illicit drug use.   HOME MEDICATIONS: As per the records from dialysis center: 1.  Acetaminophen and hydrocodone 5 mg oral tablet every 12 hours as needed for pain.  2.  Amlodipine 10 mg oral daily.  3.  Aspirin 81 mg once a day.  4.  Cetirizine 10 mg at bedtime.  5.  Citalopram 20 mg once a day.  6.  Isosorbide mononitrate 60 mg once a day.  7.  Levemir 6 units subcutaneous at night.  8.  Metoprolol 50 mg twice a day.  9.  Metoclopramide 5 mg 3 times  a day.  10.  Metronidazole 500 mg every 8 hours.  11.  Norvasc 10 mg once a day.  12.  Ondansetron 4 mg every night for nausea and vomiting.  13.  Plavix 75 mg once a day.  14.  Ranitidine 150 mg once a day.  15.  Reglan 30 minutes prior to meal take 5 mg 3 times a day as needed.  16.  Renvela 800 mg 3 times a day before meals.   PHYSICAL EXAMINATION: VITAL SIGNS: In the ER, temperature 98.7, pulse 69, respirations 20, blood pressure 176/71 and pulse oximetry 100 on room air.  GENERAL:  The patient is fully alert and oriented to time, place, and person. Does not appear in any acute distress.  HEENT: Head and neck atraumatic. Conjunctivae pale. Oral mucosa moist.  NECK: Supple. No JVD.  RESPIRATORY: Bilateral equal and clear air entry.  CARDIOVASCULAR: S1, S2 present. Murmur present. Regular rate.  ABDOMEN: Soft, nontender. Bowel sounds present. No organomegaly.  SKIN: No rashes. There is AV fistula present on left arm.  NEUROLOGIC: Power 5/5. Moves all 4 limbs. No gross abnormality. No tremors.  PSYCHIATRIC: Does not appear in any acute psychiatric illness.  JOINTS: No swelling or tenderness.   IMPORTANT DIAGNOSTIC DATA: Laboratory: Total CK level 40. CK-MB 0.9. Glucose 195, BUN 34, creatinine 7.14, sodium 132, potassium 4.5. WBC 6.5, hemoglobin 10.9, platelet count 156,000, MCV 90. Lipase 555. Troponin less than 0.02.   Chest x-ray, portable: No evidence of acute cardiopulmonary disease.   ASSESSMENT AND PLAN: A 54 year old female who has history of coronary artery disease and stent placement with hypertension, diabetes, end-stage renal disease on hemodialysis, and hyperlipidemia sent from the dialysis center with having chest pain  1.  Chest pain. In view of having coronary artery disease and stent, we will admit her to telemetry for observation. We will follow serial troponins and we will get cardiology consult with her primary cardiologist, Dr. Neoma Laming. Most likely the pain appears to be gastric in origin so I will give her PPI b.i.d., and we will give her sucralfate for possible gastroparesis pain.  2.  Coronary artery disease status post stent. Will continue her beta blocker, aspirin, Plavix, and statin. She is allergic to ACE inhibitors so not a candidate for that.  3.  Hypertension. We will continue metoprolol. Currently blood pressure is on the high side, but she is supposed to get dialysis today so feel that will help to bring it down.  4.  Diabetes. We will keep  her on sliding scale insulin coverage.  5.  Hyperlipidemia. We will continue statin.  6.  End-stage renal disease, on hemodialysis on Monday, Wednesday, and Friday. ER spoke to Dr. Candiss Norse to get hemodialysis today and she will get it today.  7. smoking- councelled to quit for 4 min- she want nicotin patch in hospital.  CODE STATUS: FULL.  The plan discussed with the patient in detail about getting work-up about cardiology and possibility of getting discharged tomorrow.   TOTAL TIME SPENT ON THIS ADMISSION: 50 minutes.  ____________________________ Ceasar Lund Anselm Jungling, MD vgv:sb D: 12/02/2013 10:25:22 ET T: 12/02/2013 10:51:41 ET JOB#: [id number]  cc: Ceasar Lund. Anselm Jungling, MD, <Dictator> Vaughan Basta MD ELECTRONICALLY SIGNED 12/03/2013 3:24

## 2015-01-13 NOTE — Discharge Summary (Signed)
PATIENT NAME:  [name], [name] MR#:  [id number] DATE OF BIRTH:  1961/05/28  DATE OF ADMISSION:  05/30/2014 DATE OF DISCHARGE:  06/01/2014  DISCHARGE DIAGNOSES:  1.  Hyperkalemia secondary to missed hemodialysis requiring emergent hemodialysis. 2.  Hyponatremia secondary to fluid overload, now resolved.  3.  Acute pancreatitis, resolved, tolerated diet.   SECONDARY DIAGNOSES:  1.  Coronary artery disease status post myocardial infarction. 2.  History of pancreatitis.  3.  Neuropathy.  4.  Chronic constipation.  5.  Irritable bowel syndrome.  6.  Osteoarthritis.  7.  End-stage renal disease on hemodialysis.  8.  Diabetic retinopathy.  9.  Type 2 diabetes.  10.  Gastroesophageal reflux disease.  11.  Hiatal hernia.  12.  Gastroparesis.   CONSULTATION: Nephrology, Dr. Juleen China.   PROCEDURES AND RADIOLOGY: 1.  Chest x-ray on September 8 showed no acute cardiopulmonary disease.  2.  Major laboratory panel: UA on admission was negative.   HISTORY AND SHORT HOSPITAL COURSE: The patient is a 54 year old female with the above-mentioned medical problems who was admitted for abdominal pain, nausea, vomiting. She was found to have hyperkalemia. Please see Dr. Ward Givens dictated history and physical for further details. Nephrology consultation was obtained with Dr. Lavonia Dana for emergent hemodialysis considering the patient missing hemodialysis as an outpatient, leading to fluid overload and hyperkalemia. The patient was also found to have acute pancreatitis based on chemical enzyme levels and also based on her symptoms. The patient was managed conservatively with slowly advancing the diet. She was feeling much better by September 10, was able to tolerate her diet. Her pain was controlled along with nausea and vomiting, which were resolved. Her hyperkalemia was resolved. She was feeling much better, close to her baseline, and was discharged home on September 10 in stable condition.   VITAL SIGNS:  On the date of discharge, her vital signs were as follows: Temperature 97.4, heart rate 61 per minute, respirations 18 per minute, blood pressure 149/70 mmHg. She was saturating 95% on room air.   PERTINENT PHYSICAL EXAMINATION ON THE DATE OF DISCHARGE: CARDIOVASCULAR: S1, S2 normal. No murmur, rubs, or gallop.  LUNGS: Clear to auscultation bilaterally. No wheezing, rales, rhonchi, or crepitation.  ABDOMEN: Soft, benign.  NEUROLOGIC: Nonfocal examination.  All other physical examination remained at the baseline.   DISCHARGE MEDICATIONS:  1.  Metoprolol 50 mg p.o. b.i.d.  2.  Metoclopramide 5 mg p.o. 3 times a day.  3.  Plavix 75 mg p.o. daily.  4.  Meclizine 25 mg p.o. 3 times a day as needed.  5.  Renvela 800 mg p.o. 3 times a day and 1 tablet twice a day with snacks.  6.  Vitamin D3, 400 international units once daily.  7.  Aspirin 81 mg p.o. daily.  8.  Amlodipine 10 mg p.o. daily.  9.  Atorvastatin 20 mg p.o. daily.  10.  Ranitidine 150 mg p.o. daily.  11.  Zofran 4 mg p.o. b.i.d. as needed.  12.  Renal capsules once daily.  13.  Nitrostat 0.4 mg sublingual x 1 and repeat x 2 as needed for chest pain.  14.  Insulin Levemir 8 units subcutaneous at bedtime.  15.  Proventil HFA 1 puff inhaled 4 times a day as needed.  16.  Acetaminophen/hydrocodone 325/5 mg 1 tablet p.o. every 6 hours.  17.  Cetirizine 10 mg p.o. daily.  18.  MiraLax once daily as needed.   DISCHARGE DIET: Low sodium, low fat, low cholesterol, 1800 ADA.  DISCHARGE ACTIVITY: As tolerated.  DISCHARGE INSTRUCTION AND FOLLOWUP: The patient was instructed to follow up with her primary care physician, Dr. Kingsley Spittle,  on September 17 as scheduled. She will need followup with [facility] in 1-2 weeks. She will get hemodialysis as scheduled as an outpatient.   TOTAL TIME DISCHARGING THIS PATIENT: 45 minutes.    ____________________________ Lucina Mellow. Manuella Ghazi, MD vss:ST D: 06/04/2014 22:18:54  ET T: 06/04/2014 22:58:45 ET JOB#: [id number]  cc: Ethelwyn Gilbertson S. Manuella Ghazi, MD, <Dictator> [name], MD Mamie Levers, MD [place] MD ELECTRONICALLY SIGNED 06/05/2014 11:48

## 2015-01-14 NOTE — Consult Note (Signed)
Chief Complaint:   Subjective/Chief Complaint Please see full GI consult.  Patietn seena dn examined.  Patient seen in the past.  Hisotry of DM, HD/esrd, severe gastroparesis, recent (2/12)  heart cath and stent placement on plavix.  Was on full dose asa untill changed last week to 81 mg.  Patietn reports nausea headache yesterday, then coughed up small amount of bleed.   Patietn showed me an example of this material, appearrs to mucoid dark red, closer in appearance to hemoptysis/expectorated material than emesis.  No black stools.  Ocassional bright red hemorrhoidal bleeding, not recently.  Case discussed with Dr Tressia Miners.  Patient hemodynamically stable, stable hgb.  Though posssibly gastric more likely in appearance and history (new dx pneumonia) to be hemoptysis.  Conrtinue bid ppi, close observation.  Will follow with you.  High risk for egd with recent cardiac stent and on anticoagulation.   VITAL SIGNS/ANCILLARY NOTES: **Vital Signs.:   06-Jun-13 14:02   Temperature Temperature (F) 98   Celsius 36.6   Temperature Source oral   Pulse Pulse 72   Pulse source per vital sign device   Respirations Respirations 18   Systolic BP Systolic BP [id number]   Diastolic BP (mmHg) Diastolic BP (mmHg) 66   Mean BP 94   BP Source vital sign device   Pulse Ox % Pulse Ox % 92   Pulse Ox Activity Level  At rest   Oxygen Delivery Room Air/ 21 %   Electronic Signatures: Loistine Simas (MD)  (Signed 06-Jun-13 15:09)  Authored: Chief Complaint, VITAL SIGNS/ANCILLARY NOTES   Last Updated: 06-Jun-13 15:09 by Loistine Simas (MD)

## 2015-01-14 NOTE — Op Note (Signed)
PATIENT NAME:  [name], [name] MR#:  [id number] DATE OF BIRTH:  Aug 19, 1961  DATE OF PROCEDURE:  12/23/2011  PREOPERATIVE DIAGNOSES:  1. End-stage renal disease requiring hemodialysis.  2. Poorly functioning dialysis access with difficulty in cannulation and prolonged bleeding.   POSTOPERATIVE DIAGNOSES:  1. End-stage renal disease requiring hemodialysis.  2. Poorly functioning dialysis access with difficulty in cannulation and prolonged bleeding.   PROCEDURES PERFORMED:  1. Right upper extremity shuntogram.  2. Percutaneous transluminal angioplasty and stent placement of the venous anastomosis right upper extremity shunt.  3. Percutaneous transluminal angioplasty of the subclavian vein.   SURGEON: Katha Cabal, MD  SEDATION: Versed 4 mg plus fentanyl 150 mcg administered IV. Continuous ECG, pulse oximetry and cardiopulmonary monitoring is performed throughout the entire procedure by the interventional radiology nurse. Total sedation time is 1 hour, 30 minutes.   ACCESS: 7 French sheath, right arm AV shunt, antegrade direction.   CONTRAST USED: Isovue 30 mL.   FLUORO TIME: 4.7 minutes.   INDICATIONS: [name] is a 54 year old woman who presented to the office with increasing problems with dialysis access. She is sustained currently with a right arm brachial axillary dialysis graft and has undergone stenting in several locations in the past. Prior to this she has not had any difficulties with the venous anastomosis. The risks and benefits were reviewed. All questions are answered and patient has agreed to proceed with angiography and possible intervention.   DESCRIPTION OF PROCEDURE: Patient is taken to special procedures, placed in supine position. After adequate sedation is achieved, right arm is extended palm upward. Right arm is prepped and draped in sterile fashion. 1% lidocaine is infiltrated in the soft tissues and access to the AV graft is obtained with a micropuncture needle,  microwire followed by micro sheath, J-wire followed by a 6 French sheath. Hand injection of contrast is utilized to demonstrate the AV graft and central veins. 4000 units of heparin is given and a Magic torque wire is introduced.   A 7 x 6 balloon is advanced across the venous anastomosis where a high-grade stricture is identified and it is inflated to 16 atmospheres. This balloon is also advanced into the subclavian where initial dilation of the subclavian stenosis is performed. Follow-up angiography demonstrates minimal improvement in the venous lesion and it is therefore elected to place a FLAIR stent. An 8 mm x 50 mm FLAIR stent is then opened onto the field. Magic torque wire is positioned and the 6 [place] sheath is removed. The FLAIR stent is then advanced over the wire directly and deployed across the venous anastomosis. It is post dilated initially with a 7 mm Conquest balloon. Follow-up angiography demonstrates an excellent result in this area. There is persistent stenosis subclavian as this was undersized and therefore after placing the 7 French sheath a 10 mm x 4 cm ATB balloon is advanced across the lesion. It is inflated, however, it does not adequately dilate the lesion and upon inflation to 15 to 16 atmospheres it does rupture. The balloon is then removed without complication. A 10 x 4 Conquest balloon is then advanced across this lesion, inflated to 28 atmospheres for one minute. Follow-up angiography demonstrates excellent result at the subclavian lesion as well.   Pursestring suture of 4-0 Monocryl is then placed around the sheath. The sheath is removed and there are no immediate complications.   INTERPRETATION: Initial views demonstrate the previously placed stents in the cannulation sites remain widely patent. There is no evidence of  recurrent stenosis within these segments. The more proximal graft is noted to be narrowed at the venous anastomosis. There is then dilatation of the axillary  vein with a subtotal occlusion of the subclavian vein at the level of the clavicle. More proximal subclavian vein, innominate vein and superior vena cava were widely patent.   Following angioplasty there is minimal improvement at the venous anastomosis and therefore a stent is deployed and post dilated as described above with excellent result. Following second angioplasty of the subclavian to 10 mm, there is an excellent result.      SUMMARY: Successful salvage of the right arm brachial axillary dialysis graft as described above.   ____________________________ Katha Cabal, MD ggs:cms D: 12/23/2011 17:42:15 ET T: 12/24/2011 09:28:02 ET JOB#: [id number]  cc: Katha Cabal, MD, <Dictator> Katha Cabal MD ELECTRONICALLY SIGNED 12/24/2011 11:23

## 2015-01-14 NOTE — Consult Note (Signed)
PATIENT NAME:  [name], [name] MR#:  [id number] DATE OF BIRTH:  Mar 30, 1961  DATE OF CONSULTATION:  02/26/2012  CONSULTING PHYSICIAN:  Lollie Sails, MD/Delane Stalling Orrin Brigham, NP PRIMARY CARE PHYSICIAN: Ellin Goodie, MD  PRIMARY CARDIOLOGIST: Neoma Laming, MD    HISTORY OF PRESENT ILLNESS: Ms. Lappen is a 54 year old African American female who has been admitted with chest pain, nausea, and hematemesis. Her past medical history is significant for chronic abdominal pain, gastroparesis, diabetes mellitus, ESRD on dialysis Monday, Wednesday and Friday, and recent insertion of drug-eluting stent in February 2013 with Plavix and aspirin therapy, hypertension, and recent finding of pulmonary edema versus pneumonia on chest CT. GI has been consulted at the request of Dr. Tressia Miners for hematemesis. The patient's history also is significant for colonoscopy in 2008 which revealed diverticula, angiectasias and mycoses. She has had multiple EGDs, the last was 2010 which revealed gastroparesis, gastritis, and a normal esophagus and duodenum. No H. pylori, dysplasia or malignancy were found. At some point, per chart review, the patient has had an abnormal gastric emptying study at 27%. Today she states that she has been around her great-nephew, who had a cold. She states she started with a cough and then reports that she began with some left upper quadrant abdominal pain that travels to the epigastrium yesterday. She also reports associated nausea and vomited multiple times yesterday and once this morning. She states that the emesis is mostly green. She does report a couple of episodes of bright red blood with emesis yesterday and one episode this morning. She says it was about a tablespoon's worth. She does state the pain medication helps her abdomen feel a little better. She states she feels a little bloated. Her weight fluctuates a little over the last couple of weeks but not by more than 2 pounds. She denies appetite  problems. She says she eats fairly well. She denies black tarry stools, blood in the stool, changes to bowel habits. She says that she has a firm bowel movement daily. Her hemoglobin is stable, and she is currently receiving her hemodialysis today. Lipase was 286. Her liver panel was within normal limits. Her cardiac enzymes were without elevation x3. PT 12.2, INR 0.9. Aspirin has been stopped although she is to continue on Plavix 75 mg p.o. daily. She is receiving IV pantoprazole 40 mg b.i.d., had a two-view unremarkable abdominal x-ray. CT of her chest revealed left lower lobe and left upper lobe pneumonia, bilateral pulmonary effusions, cardiomegaly, and some adenopathy. She did have a low-grade temperature on admission but none now, and her vital signs are stable.   PAST MEDICAL/SURGICAL HISTORY:  1. End-stage renal disease, normally has dialysis Monday, Wednesday, Friday. 2. Insulin-dependent diabetes. 3. Coronary artery disease, status post recent RCA drug-eluting stent for 95% stenosis in February 2013 by Dr. Juanda Bond. 4. Diabetic gastroparesis. 5. Hypertension. 6. Hyperlipidemia. 7. Anemia of chronic disease. 8. Constipation. 9. Depression. 10. Anxiety.  11. Chronic diastolic heart failure, well compensated.  12. Bilateral hand tendinitis repairs.  13. Right eye cataract surgery. 14. Left eye laser surgery.  15. Appendectomy.  16. Hysterectomy. 17. Cholecystectomy. 18. Right arm fistula placement.   ALLERGIES: Phenergan, OxyContin, oxycodone, codeine, ACE inhibitors.   CURRENT HOME MEDICATIONS:  1. Aspirin 81 mg p.o. daily.  2. Cetirizine 10 mg p.o. daily.  3. Celexa 20 mg p.o. daily.  4. Vicodin 5/500 mg, 1 tab p.o. every 6 hours p.r.n.  5. Levemir 12 to 14 units at bedtime.  6. Lipitor  20 mg p.o. daily.  7. Reglan 5 mg p.o. t.i.d. prior to meals.  8. Metoprolol 50 mg p.o. b.i.d.  9. Nitrostat 0.4 mg SL p.r.n. chest pain. 10. Norvasc 10 mg p.o. daily.  11. Novolin  regular insulin sliding scale. 12. Protonix 40 mg p.o. daily.  13. Plavix 75 mg p.o. daily.  14. Renvela  800 mg, 1 tab p.o. t.i.d. prior to meals. 15. Zofran 4 mg p.o. b.i.d.   SOCIAL HISTORY: She lives with son. Former history of tobacco abuse, none in the last two months. Occasional wine.  FAMILY HISTORY: No family history of colorectal cancer, liver disease, ulcers. Father with lung cancer, coronary artery disease. Mother with diabetes. Sisters with kidney problems and dialysis. One sister with endometrial cancer.   REVIEW OF SYSTEMS: GENERAL: No fever. Increasing weakness, occasional chills, weight fluctuation as noted. HEENT: No blurry vision, double vision, pain. History of vertigo but no tinnitus, epistaxis or discharge. No complaints of headache. RESPIRATORY: Positive for cough. No shortness of breath at rest. Does have some shortness of breath on exertion. CARDIOVASCULAR: Denies chest pain at present, orthopnea, unusual swelling, or syncope. GASTROINTESTINAL: As noted. GENITOURINARY: Does receive dialysis normally Monday, Wednesday, Friday. Minimal urine output. Shunt to the right arm has been placed. ENDOCRINE: History positive for diabetes. No history of thyroid problems at present. HEMATOLOGIC:  Does have history of anemia of chronic disease. No easy bruising. She is on blood thinner therapy. SKIN: No erythema, lesion, or rash. MUSCULOSKELETAL: No unusual joint pain. NEUROLOGIC: No history of stroke, numbness, tingling, dizziness, seizures.  PSYCHOLOGICAL: No complaints of any anxiety, insomnia, or depression at present.   LABORATORY, DIAGNOSTIC AND RADIOLOGICAL DATA:  Most recent lab work: Glucose 109, BUN 35, creatinine 6.97, sodium 134, potassium 4.7, chloride 97, GFR 7, calcium 8.6. Lipase 286.  Liver panel is within normal limits.  Cardiac enzymes have been negative x3.  WBC 6.5, hemoglobin 9.7, hematocrit 31.1, platelets 107, does have normocytic red cells with increased RDW.  PT  12.2, INR 0.9. Chest CT did reveal left lower lobe and upper lobe pneumonia, bilateral effusions, adenopathy, increased cardiac size, and a patent dialysis catheter. Two-view abdomen was unremarkable by report.   PHYSICAL EXAMINATION:  VITAL SIGNS: Most recent vital signs: Temperature 98.3, pulse 66, respiratory rate 20, blood pressure 120/63, and oxygen saturation 91%. Fingerstick this morning was 120.   GENERAL: A well-nourished African American female lying in bed receiving hemodialysis, in no acute distress, appears comfortable.   HEENT: Normocephalic, atraumatic. Sclerae are anicteric. No erythema, lesions or rash to the eyes or the nares. Oral mucous membranes are pink and moist.   NECK: Supple. No thyromegaly, JVD or lymphadenopathy.   LUNGS: Right lung clear throughout. Left lung somewhat coarse. No retractions or difficulty breathing. Respirations are even and unlabored.   CARDIOVASCULAR: S1, S2. Regular rate and rhythm. No murmurs, gallops or rubs. Peripheral pulses 2+.  No appreciable edema.   ABDOMEN: Rounded abdomen. Bowel sounds x4. Soft, epigastric tenderness noted. No guarding, rigidity, hepatosplenomegaly, peritoneal signs, or hernias.   GENITOURINARY: Deferred.   EXTREMITIES: No clubbing or cyanosis. Strength 5/5. Moves all extremities well x4. Does have dialysis catheter in right upper arm.   SKIN: No erythema, lesion, or rash.   NEUROLOGIC: Cranial nerves II through XII are grossly intact. Alert and oriented x3. Speech clear. No facial droop.   PSYCHOLOGICAL: Cooperative, pleasant, logical thought.   IMPRESSION: Hemoptysis versus hematemesis.   PLAN: The patient is high risk for endoscopy due to  Plavix therapy and a recent drug-eluting stent placement. Continue clinical observation and b.i.d. IV PPI.  Would agree with antiemetics as well. We will assess stool for occult blood. May require EGD, further recommendations to follow.   These services were provided by  Stephens November, MSN, [place] in collaboration with Loistine Simas, MD with whom I have discussed this patient in full.  ____________________________ Theodore Demark, NP chl:cbb D: 02/26/2012 14:46:45 ET T: 02/26/2012 15:03:44 ET JOB#: [id number]  [place] FNP ELECTRONICALLY SIGNED 03/02/2012 8:22

## 2015-01-14 NOTE — Consult Note (Signed)
Chief Complaint:   Subjective/Chief Complaint patient sedated, couldnt keep awake for responses.  reported episode of hemoptysis while in dialysis, then followed by expectoration of clear fluids.  No other evidence of bleeding no melena.   VITAL SIGNS/ANCILLARY NOTES: **Vital Signs.:   07-Jun-13 14:00   Vital Signs Type Routine   Temperature Temperature (F) 98.3   Celsius 36.8   Temperature Source oral   Pulse Pulse 77   Pulse source per vital sign device   Respirations Respirations 20   Systolic BP Systolic BP 99991111   Diastolic BP (mmHg) Diastolic BP (mmHg) 77   Mean BP 114   BP Source vital sign device   Pulse Ox % Pulse Ox % 95   Pulse Ox Activity Level  At rest   Oxygen Delivery Room Air/ 21 %   Brief Assessment:   Cardiac Regular    Respiratory poor breath sounds on left.    Gastrointestinal details normal Soft  Nontender  Nondistended  No masses palpable  Bowel sounds normal   Lab Results: Routine Hem:  05-Jun-13 12:31    Hemoglobin (CBC)  10.2    22:48    Hemoglobin (CBC)  9.4 (Result(s) reported on 25 Feb 2012 at 11:40PM.)  06-Jun-13 06:50    Hemoglobin (CBC) -   Hemoglobin (CBC)  9.7   Assessment/Plan:  Assessment/Plan:   Assessment 1) hemoptysis, less likely GI source.  small volumes, hemodynamically stable.  2) history of diabetes related gastroparesis-on ppi and reglan.    Plan 1) continue current.  strict low residue diet once handling full liquids. Gi will follow at a distance.  Please reconsult if needed. Dr  Vira Agar covering GI call this weekend.   Electronic Signatures: Loistine Simas (MD)  (Signed 07-Jun-13 17:00)  Authored: Chief Complaint, VITAL SIGNS/ANCILLARY NOTES, Brief Assessment, Lab Results, Assessment/Plan   Last Updated: 07-Jun-13 17:00 by Loistine Simas (MD)

## 2015-01-14 NOTE — Consult Note (Signed)
PATIENT NAME:  [name], [name] MR#:  [id number] DATE OF BIRTH:  [birth year]  DATE OF CONSULTATION:  02/28/2012  REFERRING PHYSICIAN:   CONSULTING PHYSICIAN:  Dionisio David, MD  INDICATION: Evaluate for chest pain.   HISTORY OF PRESENT ILLNESS: This is a 54 year old African American female with a past medical history of end-stage renal disease on dialysis, diabetes mellitus, and gastroparesis due to diabetes who came in with nausea and vomiting and coughing up blood. She also has coronary artery disease. She had PCI and stenting of 95% RCA lesion with drug-eluting stent, in February 2013. She is on aspirin and Plavix.   PAST MEDICAL HISTORY:  1. End-stage renal disease. 2. Diabetes. 3. Hypertension. 4. Hyperlipidemia. 5. Gastroparesis.   PHYSICAL EXAMINATION:   GENERAL: She is alert and oriented x3, in no acute distress right now.   VITAL SIGNS: Vitals are stable.   NECK: No JVD.   LUNGS: Clear.   HEART: Regular rate and rhythm. Normal S1 and S2. No audible murmur.   ABDOMEN: Soft with slight tenderness in the epigastrium.   EXTREMITIES: No pedal edema.   LABS/STUDIES: EKG shows normal sinus rhythm. No acute changes   Cardiac enzymes are unremarkable.   ASSESSMENT AND PLAN: The patient has atypical chest pain. EKG is normal. She has hematemesis and gastroenterology has seen her, but her hemoglobin is stable. Advised continuation of baby aspirin and Plavix because she has a drug-eluting stent and maybe add Protonix 40 mg twice a day.   Thank you very much for the referral. ____________________________ Dionisio David, MD sak:slb D: 02/28/2012 12:13:43 ET T: 02/28/2012 12:30:05 ET JOB#: [id number]  cc: Dionisio David, MD, <Dictator>  Dionisio David MD ELECTRONICALLY SIGNED 03/01/2012 15:48

## 2015-01-14 NOTE — Discharge Summary (Signed)
PATIENT NAME:  [name], [name] MR#:  [id number] DATE OF BIRTH:  02-Apr-1961  DATE OF ADMISSION:  10/28/2011 DATE OF DISCHARGE:  10/29/2011  HISTORY: This is a 54 year old African American female with a past medical history of hypertension and renal failure on hemodialysis, who came into the hospital with chest pain, was discharged, had a follow-up stress test which showed ischemia in the RCA territory and thus underwent cardiac catheterization. Cardiac catheterization showed 95% lesion in the proximal RCA. She had a drug-eluting stent placed in the proximal RCA and reduced it to 0%. She had TIMI 3 flow to the right coronary after the angioplasty. She was observed overnight and is being discharged with follow-up in the office.   DISCHARGE DIAGNOSES:  1. End-stage renal disease. Will get dialysis today.  2. History of coronary artery disease. 3. Unstable angina status post drug-eluting stent placed in the proximal right coronary artery reduced from 95% to 0%.   DISCHARGE MEDICATIONS: The patient will be sent home on aspirin 325, Plavix 75 mg p.o. daily plus her usual medications.   FOLLOW-UP:  Follow-up in the office on Thursday.    ____________________________ Dionisio David, MD sak:ap D: 10/30/2011 09:12:15 ET T: 10/30/2011 [id number] JOB#: [id number]  cc: Dionisio David, MD, <Dictator> Dionisio David MD ELECTRONICALLY SIGNED 11/12/2011 11:22

## 2015-01-14 NOTE — Discharge Summary (Signed)
PATIENT NAMECYERRA, [name] MR#:  [id number] DATE OF BIRTH:  1961-02-15  DATE OF ADMISSION:  02/25/2012 DATE OF DISCHARGE:  03/04/2012  ADMITTING PHYSICIAN: Gladstone Lighter, MD   PRIMARY MD: Dr. Kingsley Spittle   DISCHARGING PHYSICIAN: Gladstone Lighter, MD   [place]:  1. Nephrology consultation by Dr. Candiss Norse  2. GI consultation by Dr. Gustavo Lah   3. Cardiology consultation by Dr. Neoma Laming    DISCHARGE DIAGNOSES:  1. Hemoptysis secondary to pneumonia, resolved.  2. Multilobar pneumonia.  3. Gastroparesis with nausea, vomiting, and abdominal pain.  4. Gastritis.  5. Hypertension.  6. Diabetes mellitus.  7. End-stage renal disease on Monday, Wednesday, Friday hemodialysis.  8. Coronary artery disease status post RCA stent put in February 2013.  9. Depression and anxiety.  10. Chronic diastolic heart failure.  11. Anemia of chronic disease.   DISCHARGE HOME MEDICATIONS:  1. Isosorbide mononitrate 60 mg p.o. daily.  2. Celexa 20 mg p.o. daily.  3. Atorvastatin 20 mg p.o. daily.  4. Norvasc 10 mg p.o. daily.  5. Renvela 1600 mg p.o. 3 times a day with meals.  6. Reglan 5 mg p.o. q.6 hours with meals.  7. Metoprolol 50 mg p.o. b.i.d.  8. Protonix 40 mg p.o. b.i.d.  9. Fish Oil 1000 mg capsule p.o. daily.  10. Colace 100 mg p.o. b.i.d.  11. Xanax 0.5 mg p.o. q.6 hours p.r.n. for anxiety.  12. Plavix 75 mg p.o. daily.  13. Norco 5/325 mg 1 tablet p.o. q.6 hours p.r.n. for pain.  14. Zofran oral disintegrating tablet 4 mg q.6 hours p.r.n.  15. Levemir 6 units sub-Q daily.  16. Levaquin 500 mg p.o. q.48 hours until 03/10/2012.   DISCHARGE DIET: Low sodium, ADA diet. However, the patient was advised to stay on full liquid diet for another 1 to 2 days prior to advancing.   DISCHARGE ACTIVITY: As tolerated.   FOLLOW-UP INSTRUCTIONS:  1. She is supposed to follow-up for dialysis tomorrow as scheduled.  2. Follow-up with Dr. Delana Meyer in five days as she had  a PICC line put in as an outpatient by Dr. Delana Meyer for poor IV access 3. PCP follow-up in 1 to 2 weeks.   LABS AND IMAGING STUDIES PRIOR TO DISCHARGE: Sodium 133, potassium 4.5, chloride 95, bicarb 29, BUN 22, creatinine 6.8, glucose 153, calcium 8.6, WBC 5.1, hemoglobin 9.8, hematocrit 31.3, platelet count 164.    Chest x-ray on 02/28/2012 showing bilateral interstitial prominence significantly improved when compared to admission. No focal parenchymal opacity, effusion, or pneumothorax seen. Heart is enlarged.   Admission chest x-ray showing diffuse bilateral interstitial thickening pulmonary edema versus pneumonitis.   Cardiac enzymes remained negative.   CT of the chest without contrast showing left lower lobe and left upper lobe pneumonia. Bilateral pleural effusions larger on the right side with some compressive atelectasis. Mediastinal adenopathy is present as well.   BRIEF HOSPITAL COURSE: [name] is a 54 year old female with history of end-stage renal disease on hemodialysis, hypertension, diabetes, history of diabetic gastroparesis with multiple admissions in the past for epigastric pain with nausea and vomiting who presented this time with hemoptysis and nausea, vomiting. Chest x-ray on admission showed multilobar pneumonia confirmed on chest CT so was admitted for the same.  1. Hemoptysis with multilobar pneumonia as seen on CT of the chest. She was started on Levaquin with significant improvement as seen on the chest x-ray prior to discharge. She did also have some pulmonary edema which  was causing some of the hemoptysis. That improved with dialysis.  2. Diabetic gastroparesis. Pneumonia improved within 1 to 2 days of hospitalization. However, her nausea, vomiting, and epigastric pain got worse, possible gastritis. Hemoglobin remained stable. Aspirin was stopped and GI consult was requested. They were not keen on doing any endoscopic procedures because hemoglobin was stable and she  cannot be taken off of Plavix anyway with her recent stent. She was placed on IV Protonix b.i.d. while in the hospital and is being changed to oral PPI b.i.d. at the time of discharge. Her symptoms have partially improved and she was tolerating full liquid diet well so instead of rapidly advancing her diet will discharge her on full liquid diet and advised to slowly advance diet as an outpatient. The patient continued to take her Reglan as an outpatient prior to meals. 3. End-stage renal disease on Monday, Wednesday, Friday hemodialysis. The patient was dialyzed while in the hospital as per her schedule. She did tolerate the dialysis well.  4. Depression and anxiety. The patient is on Celexa and also on Xanax p.r.n.   5. Hypertension. The patient is on metoprolol, Norvasc, and Imdur.  6. Coronary artery disease status post recent stent in February 2013 by Dr. Neoma Laming. She is stable cardiac-wise. Will hold aspirin at this time because of hematemesis, hemoptysis, and gastritis. Continue Plavix. She can follow-up with Dr. Humphrey Rolls as an outpatient.  7. Pneumonia. As mentioned in point #1 the patient was started on Levaquin and is being discharged on Levaquin every 48 hours until 03/10/2012.   Her course has been otherwise uneventful in the hospital.   DISCHARGE CONDITION: Stable.   DISCHARGE DISPOSITION: Home.        TIME SPENT ON DISCHARGE: 45 minutes.   ____________________________ Gladstone Lighter, MD rk:drc D: 03/05/2012 15:30:55 ET T: 03/07/2012 14:25:45 ET JOB#: [id number]  cc: Gladstone Lighter, MD, <Dictator> Ellamae Sia, MD [place] Lilian Kapur, MD Lollie Sails, MD Dionisio David, MD Gladstone Lighter MD ELECTRONICALLY SIGNED 03/17/2012 15:22

## 2015-01-14 NOTE — Op Note (Signed)
PATIENT NAME:  [name], [name] MR#:  [id number] DATE OF BIRTH:  1961/08/25  DATE OF PROCEDURE:  12/10/2011  DATE OF PROCEDURE: 12/10/2011.   PROCEDURE PERFORMED:  1. Pars plana vitrectomy of the right eye.  2. Panretinal photocoagulation of the right eye.  3. Phacoemulsification and intraocular lens insertion, right eye.  PREOPERATIVE DIAGNOSES: 1. Nonclearing vitreous hemorrhage of the right eye.  2. Proliferative diabetic retinopathy of the right eye.  3. Visually significant cataract of the right eye.   POSTOPERATIVE DIAGNOSES: 1. Nonclearing vitreous hemorrhage of the right eye.  2. Proliferative diabetic retinopathy of the right eye.  3. Visually significant cataract of the right eye.   ESTIMATED BLOOD LOSS: Less than 1 mL.   PRIMARY SURGEON: Garlan Fair, M.D.   ANESTHESIA: Subconjunctival block of the right eye with monitored anesthesia care.   COMPLICATIONS: None.   ESTIMATED BLOOD LOSS: Less than 1 mL.   INDICATIONS FOR PROCEDURE: This patient presented to my office with decreased vision in the right eye. Examination revealed a dense vitreous hemorrhage and visually significant cataract of the right eye. Risks, benefits, and alternatives of the above procedure were discussed and the patient wished to proceed.   DETAILS OF PROCEDURE: After informed consent was obtained, the patient was brought into the operative suite at [facility]. The patient was placed in the supine position and was given multiple drops of tetracaine. The right eye was prepped and draped in a sterile manner by the primary surgeon.  After a lid speculum was inserted, a small conjunctival wound was created inferonasally. A sub-[place] tract was created into the retrobulbar space. Retrobulbar block was created through this tract using a cannulated syringe.  Once the block was completed, a small side-port wound was created in a clear corneal fashion at approximately 10:30. DisCoVisc was  injected in the anterior chamber to maintain it. The main corneal wound was created at approximately 12:00 with a keratome blade. Cystotome was introduced in the eye and a continuous 360-degree anterior capsulorrhexis was created. The lens was hydrodissected using BSS on a 26-gauge cannula. The lens was rotated for 90 degrees. A phacoemulsification wand was introduced and the lens was broken into four quadrants and removed without complications. INA was used to remove the remnant cortical material. DisCoVisc was injected into the capsular bag. A 23-diopter SN60WF lens, serial [id number] was injected into the capsular bag and rotated into position. The DisCoVisc was removed using INA. The wounds were hydrated using BSS. The wounds were noted to be watertight and attention was turned to the pars plana vitrectomy portion of the case. A 25-gauge trocar was placed inferotemporally through displaced conjunctiva 3 mm beyond the limbus in an oblique fashion. The infusion cannula was turned on and inserted through the trocar and secured in position with Steri-Strips. Two more trocars were placed in a similar fashion superotemporally and superonasally. The vitreous cutter and light pipe were introduced into the eye and a complete core vitrectomy was performed. Vitreous face was elevated off of the retina using suction. The vitreous face was then removed for 360 degrees. Care was taken to remove as much peripheral hemorrhage as possible. Endolaser was introduced into the eye and 360 degrees of panretinal photocoagulation was applied to the retina, making sure to blanket any areas of retina that had received no prior laser. A partial air-fluid exchange was then performed and the trocars were removed. The wounds were noted to be airtight and pressure in the eye was confirmed to  be approximately 15 mmHg. 5 mg of dexamethasone was given into the inferior fornix and the lid speculum was removed.  The eye was cleaned and TobraDex  was placed on the eye. A patch and shield were placed over the eye and the patient was taken to postanesthesia care with instructions to remain head up.  ____________________________ Teresa Pelton. Starling Manns, MD mfa:bjt D: 12/10/2011 09:40:48 ET T: 12/10/2011 12:24:36 ET JOB#: [id number]  cc: Teresa Pelton. Starling Manns, MD, <Dictator> Coralee Rud MD ELECTRONICALLY SIGNED 12/24/2011 7:17

## 2015-01-14 NOTE — Consult Note (Signed)
Brief Consult Note: Diagnosis: chest pain, abdominal pain, hematemesis.   Patient was seen by consultant.   Consult note dictated.   Comments: Appreciate consult for 54 y/o [place] American woman with history of chronic abdominal pain, gastroparesis, DM, ESRD on dialysis, DM, Recent Insertion of drug eluting stent (2/13) with Plavix/ ASA therapy, htn, and recent finding of pulmonary edema v. pneumonia on CT, for hematemesis. Colonoscopy 2008 revealed diverticula, angioectasias, and myochosis. Has had multiple EGDs: last 2010, which revealed gastroparesis, gastritis, and normal esophagus and duodenum.No H pylori, dysplasia/malignancy. At some point per chart review, patient had abn gastric emptying study at 27%.  States today she had been around her great-nephew, who had a cold. She started with a cough, and then states that LUQ abdominal pain that travels to the epigastrum started yesterday. Reports associated nausea, and vomited multiple times yesterday and once this am. States emesis is mostly green. States she had about a tablespoon of bright red blood in it this am, and this happened a couple of times yesterday. States the pain medication has helped her abdominal pain. Feels a little bloated, states her weight fluctuates a little, but not more than by 2lb.  Denies appetite problems, says she eats fairly well. Denies black tarry stools, blood in stools, changes to bowel habits. States that she has a firm bowel mvt daily. Her hgb is stable, and she is currently receiving hemodialysis. Lipase was 286, liver panel within normal limits, cardiac enzymes without elevation x 3.  Pt 12.2, INR 0.9. ASA has been stopped, although continues on Plavix. Is receiving IV Pantop 40mg  bid. 2 view abdominal unremarkable by report. CT chest with LLL/LUL pneumonia, bilateral pulmonary effusions, cardiomegaly, some adenopathy.  Low grade temp on adm, none now, VSS.  Imp & Plan: 1. Hemoptysis v. hematemesis. Patient is high  risk for  endoscopy due to Plavix therapy and recent drug eluting stent placement.  Continue clinical observation and bid IV PPI. Will assess stool for occ blood. May requrie EGD, further recs to follow.  Electronic Signatures: Stephens November H (NP)  (Signed 06-Jun-13 14:48)  Authored: Brief Consult Note   Last Updated: 06-Jun-13 14:48 by Theodore Demark (NP)

## 2015-01-14 NOTE — Consult Note (Signed)
Pt with constipation, does not like Miralax cause it causes her to have unexpected diarrhea.  Started on senna and docasate today.  abd without tenderness in LLQ, some tender in LUQ.  Her pneumonia is clearing on CXR today.  Pt had a small hard stool today.  Would continue your meds for her bowels and wait on them to work.  If something faster needed could use Dulcolax supp.  I will leave a PRN order for this.  Electronic Signatures: Manya Silvas (MD)  (Signed on 08-Jun-13 11:40)  Authored  Last Updated: 08-Jun-13 11:40 by Manya Silvas (MD)

## 2015-01-14 NOTE — Consult Note (Signed)
Chief Complaint:   Subjective/Chief Complaint patient doing well, denies n/v or abd pain.  states medicine for constipation is working well.  wants to go home.   VITAL SIGNS/ANCILLARY NOTES: **Vital Signs.:   12-Jun-13 15:05   Vital Signs Type Routine   Temperature Temperature (F) 97.8   Celsius 36.5   Temperature Source oral   Pulse Pulse 72   Pulse source per vital sign device   Respirations Respirations 16   Systolic BP Systolic BP 123XX123   Diastolic BP (mmHg) Diastolic BP (mmHg) 77   Mean BP 106   BP Source vital sign device   Pulse Ox % Pulse Ox % 94   Pulse Ox Activity Level  At rest   Oxygen Delivery Room Air/ 21 %   Brief Assessment:   Cardiac Regular    Respiratory clear BS    Gastrointestinal details normal Soft  Nontender  Nondistended  No masses palpable  Bowel sounds normal   Lab Results: Routine Chem:  12-Jun-13 08:30    Phosphorus, Serum 3.0 (Result(s) reported on 03 Mar 2012 at 01:47PM.)   Assessment/Plan:  Assessment/Plan:   Assessment 1) hemoptysis-resolved 2) n/v abd pain improved 3) constipation improved    Plan 1) continue current with adding senna and colace.    will sign off, reconsult if needed.   Electronic Signatures: Loistine Simas (MD)  (Signed 12-Jun-13 17:56)  Authored: Chief Complaint, VITAL SIGNS/ANCILLARY NOTES, Brief Assessment, Lab Results, Assessment/Plan   Last Updated: 12-Jun-13 17:56 by Loistine Simas (MD)

## 2015-01-14 NOTE — H&P (Signed)
PATIENT NAME:  [name], [name] MR#:  [id number] DATE OF BIRTH:  1960-12-08  DATE OF ADMISSION:  02/25/2012  ADMITTING PHYSICIAN: Dr. Gladstone Lighter  PRIMARY CARE PHYSICIAN: Dr. Kingsley Spittle  PRIMARY CARDIOLOGIST: Dr. Neoma Laming  CHIEF COMPLAINT: Chest pain, nausea, and hematemesis.   HISTORY OF PRESENT ILLNESS: [name] is a 54 year old African American female with past medical history significant for end-stage renal disease on Monday, Wednesday, Friday hemodialysis, insulin-dependent diabetes mellitus, diabetic gastroparesis with multiple admissions to the hospital with nausea, vomiting in the past, hypertension, coronary artery disease status post recent admission in 10/2011 and had a drug-eluting stent placed for 95% blocked right coronary artery, hyperlipidemia comes to the hospital with above-mentioned complaints.   Patient has had prior admissions for nausea, vomiting has not had any recent admissions for the same complaints since her last discharge in 02/2011. Last admission to the hospital was for her stent in 10/2011. Patient said she has been doing. Fine last night she had some cough before she went to sleep and she took some of her inhalers but this morning she woke up with a pretty severe headache, was extremely nauseous and coughed up pinkish frothy sputum and then had bright red blood. Afterwards she also had a couple of episodes of vomiting after which she had initially dark blood and later it turned into bright red blood. She does not remember having any prior endoscopy or prior history of reflux problems. Denies any melena, rectal bleed. She does have chronic abdominal pain. She says she had chest pain this morning but points more towards the epigastric region and it felt more like a sharp pain, nonradiating. In the ER, her vitals are stable and she is nauseous but no further episodes of hematemesis. Chest x-ray showing pulmonary vascular congestion. So she is being admitted for workup  of the same.    PAST MEDICAL HISTORY:  1. End-stage renal disease on Monday, Wednesday, Friday hemodialysis.  2. Insulin-dependent diabetes mellitus.  3. Coronary artery disease status post recent RCA drug-eluting stent for 95% stenosis in 10/2011 by Dr. Neoma Laming.  4. Diabetic gastroparesis.  5. Hypertension.  6. Hyperlipidemia.  7. Anemia of chronic disease.  8. Constipation.  9. Depression and anxiety.  10. Chronic diastolic heart failure, well compensated.   PAST SURGICAL HISTORY:  1. Bilateral hand tendinitis repairs.   2. Right eye cataract surgery.  3. Left eye laser surgery.  4. Appendectomy.  5. Hysterectomy.  6. Cholecystectomy.  7. Right arm fistula placement.   ALLERGIES TO MEDICATIONS: Phenergan, OxyContin, oxycodone, codeine, and ACE inhibitors.   CURRENT MEDICATIONS AT HOME:  1. Aspirin 81 mg p.o. daily, just changed in 01/2012, prior to that she was taking 325 mg.  2. Cetirizine 10 mg p.o. daily.  3. Celexa 20 mg p.o. daily.  4. Hydrocodone/acetaminophen which is Vicodin 5/500 mg 1 tablet daily every six hours p.r.n.  5. Levemir 12 to 14 units at bedtime.  6. Lipitor 20 mg p.o. daily.  7. Reglan 5 mg t.i.d. prior to meals.  8. Metoprolol 50 mg b.i.d.  9. Nitrostat 0.4 mg sublingual p.r.n. for chest pain.  10. Norvasc 10 mg p.o. daily.  11. Novolin regular insulin sliding scale insulin.  12. Protonix 40 mg p.o. daily.  13. Plavix 75 mg p.o. daily. 14. Renvela 800 mg 1 tablet p.o. t.i.d. prior to meals. 15. Zofran 4 mg twice a day.   SOCIAL HISTORY: Currently living with son. Quit smoking about two months ago. Taking wine  once in a while as recommended by her cardiologist.   FAMILY HISTORY: Sisters with kidney problems and on dialysis and passed away; one sister with endometrial cancer. Dad with lung cancer and history of coronary artery disease and diabetes in mom.    REVIEW OF SYSTEMS: CONSTITUTIONAL: No fever. Having chills. No weakness, weight loss  or weight gain. EYES: No blurred vision, double vision, pain, inflammation, or glaucoma. Positive for cataracts. ENT: Has vertigo in the right ear which is improving. No tinnitus, ear pain, epistaxis or discharge. RESPIRATORY: Positive for cough. No wheeze. Positive for hemoptysis and also dyspnea. CARDIOVASCULAR: No chest pain, orthopnea, edema, arrhythmia, palpitations, or syncope. GASTROINTESTINAL: Positive for nausea, vomiting. No diarrhea. No abdominal pain. Positive for hematemesis. No rectal bleed. GENITOURINARY: Makes minimal urine because of her renal disease. ENDOCRINE: No polyuria, nocturia, thyroid problems, heat or cold intolerance. HEMATOLOGY: Positive for anemia. No easy bruising. Positive for bleeding. SKIN: No acne, rash, or lesions. MUSCULOSKELETAL: No neck, back, shoulder pain, arthritis, or gout. NEUROLOGIC: No numbness, weakness, cerebrovascular accident, transient ischemic attack, or seizures. PSYCHOLOGICAL: Patient denies any anxiety, insomnia, or depression.   PHYSICAL EXAMINATION:  VITAL SIGNS: Temperature 99.9 degrees Fahrenheit, pulse 74, respirations 20, blood pressure 151/58, pulse oximetry 97% on room air.   GENERAL: Well-built, well-nourished female sitting in bed, not in any acute distress.   HEENT: Normocephalic, atraumatic. Pupils equal, round, reacting to light. Anicteric sclerae. Extraocular movements intact. Oropharynx clear without erythema, mass, or exudates.   NECK: Supple. No thyromegaly, JVD or carotid bruits. No lymphadenopathy.   LUNGS: Has course rhonchi on auscultation bilaterally. No wheeze. No crackles. No use of accessory muscles for breathing.   CARDIOVASCULAR: S1, S2 regular rate and rhythm. Has 3/6 systolic murmur in the precordial area.   ABDOMEN: Obese. Mild tenderness in the epigastric region. No guarding or rigidity. Hypoactive bowel sounds.   EXTREMITIES: No pedal edema. No clubbing or cyanosis. 2+ dorsalis pedis pulses palpable bilaterally.    SKIN: No acne, rash, or lesions.   LYMPH: No cervical lymphadenopathy.   NEUROLOGIC: Cranial nerves intact. No focal motor or sensory deficits.   PSYCHOLOGICAL: Patient is awake, alert, oriented x3.   LABORATORY, RADIOLOGICAL AND DIAGNOSTIC DATA:  WBC 11.5, hemoglobin 10.2, hematocrit 32.6, platelet count 124.  Sodium 137, potassium 4.5, chloride 98, bicarbonate 28, BUN 28, creatinine 6.1, calcium 8.8, glucose 71.   ALT 15, AST 15, alkaline phosphatase 88, total bilirubin 0.5, albumin 3.6, INR 0.9. Troponin less than 0.02, lipase 285. Chest x-ray on admission showing bilateral diffuse interstitial thickening likely representing interstitial edema versus pneumonitis.   Abdominal x-ray is normal. EKG on admission showing normal sinus rhythm, heart rate of 76.   ASSESSMENT AND PLAN: This 54 year old female with past medical history significant for end-stage renal disease on hemodialysis, diabetes, hypertension, gastroparesis, recent coronary artery disease status post RCA stent in 10/2011 who is on aspirin and Plavix presents with hematemesis, nausea, epigastric pain.  1. Hematemesis. Was on aspirin 325 up until last month and just changed to 81 mg and also on Plavix started 10/2011 for her stent now presenting with hematemesis. She has prior history of gastritis and esophagitis so will start on IV Protonix b.i.d. Get GI consult and frequent hemoglobin and hematocrit monitoring.  2. Dyspnea with one episode of hemoptysis, likely to be pulmonary edema that she has as soon on chest x-ray. Nephro consult for dialysis, and if she further has any episodes will get CT chest. Will give one dose of Lasix now  as she still makes a small amount of urine.  3. Hypertension. Continue home medications.  4. Diabetes. She will be only on clear liquid diet with nausea and vomiting so will hold Levemir and start sliding scale insulin. 5. Coronary artery disease status post RCA stent. Follows up with Dr. Humphrey Rolls.  Hold aspirin for now. Continue Plavix and other medications for her heart. Check troponin x3.  6. End-stage renal disease on hemodialysis. Will get nephrology consult. She is on Renvela. Monday, Wednesday, Friday schedule, missed today so will have likely tomorrow. Further management from nephrology.  7. GI and deep vein thrombosis prophylaxis. On Protonix and also TED stockings.  8. CODE STATUS: FULL CODE.   TIME SPENT ON ADMISSION: 50 minutes.   ____________________________ Gladstone Lighter, MD rk:cms D: 02/25/2012 16:13:48 ET T: 02/25/2012 16:48:56 ET JOB#: [id number]  cc: Gladstone Lighter, MD, <Dictator> Ellamae Sia, MD Dionisio David, MD  Gladstone Lighter MD ELECTRONICALLY SIGNED 02/29/2012 19:07

## 2015-01-14 NOTE — Consult Note (Signed)
Pt had good results from senna and did not need suppository.  Had pain in mid epigastric area which is better now. no new suggestions.  Electronic Signatures: Manya Silvas (MD)  (Signed on 09-Jun-13 12:12)  Authored  Last Updated: 09-Jun-13 12:12 by Manya Silvas (MD)

## 2015-01-26 ENCOUNTER — Other Ambulatory Visit: Payer: Self-pay | Admitting: Primary Care

## 2015-01-26 DIAGNOSIS — R519 Headache, unspecified: Secondary | ICD-10-CM

## 2015-01-26 DIAGNOSIS — R51 Headache: Principal | ICD-10-CM

## 2015-02-02 ENCOUNTER — Ambulatory Visit: Payer: Self-pay

## 2015-02-06 ENCOUNTER — Ambulatory Visit
Admission: RE | Admit: 2015-02-06 | Discharge: 2015-02-06 | Disposition: A | Discharge status: Home / Self Care | Payer: Medicare Other | Source: Ambulatory Visit | Attending: Primary Care | Admitting: Primary Care

## 2015-02-06 DIAGNOSIS — R519 Headache, unspecified: Secondary | ICD-10-CM

## 2015-02-06 DIAGNOSIS — R51 Headache: Secondary | ICD-10-CM | POA: Diagnosis not present

## 2015-02-06 HISTORY — DX: Essential (primary) hypertension: I10

## 2015-02-06 HISTORY — DX: Disorder of kidney and ureter, unspecified: N28.9

## 2015-02-06 HISTORY — DX: Type 2 diabetes mellitus without complications: E11.9

## 2015-02-06 HISTORY — DX: Unspecified asthma, uncomplicated: J45.909

## 2015-02-08 ENCOUNTER — Emergency Department
Admission: EM | Admit: 2015-02-08 | Discharge: 2015-02-08 | Disposition: A | Discharge status: Home / Self Care | Payer: Medicare Other | Attending: Emergency Medicine | Admitting: Emergency Medicine

## 2015-02-08 ENCOUNTER — Encounter: Payer: Self-pay | Admitting: General Practice

## 2015-02-08 ENCOUNTER — Other Ambulatory Visit: Payer: Self-pay

## 2015-02-08 DIAGNOSIS — N186 End stage renal disease: Secondary | ICD-10-CM | POA: Insufficient documentation

## 2015-02-08 DIAGNOSIS — Z992 Dependence on renal dialysis: Secondary | ICD-10-CM | POA: Diagnosis not present

## 2015-02-08 DIAGNOSIS — R0602 Shortness of breath: Secondary | ICD-10-CM | POA: Insufficient documentation

## 2015-02-08 DIAGNOSIS — Z720 Tobacco use: Secondary | ICD-10-CM | POA: Insufficient documentation

## 2015-02-08 DIAGNOSIS — I120 Hypertensive chronic kidney disease with stage 5 chronic kidney disease or end stage renal disease: Secondary | ICD-10-CM | POA: Insufficient documentation

## 2015-02-08 DIAGNOSIS — R079 Chest pain, unspecified: Secondary | ICD-10-CM | POA: Diagnosis not present

## 2015-02-08 DIAGNOSIS — R0600 Dyspnea, unspecified: Secondary | ICD-10-CM | POA: Insufficient documentation

## 2015-02-08 DIAGNOSIS — E119 Type 2 diabetes mellitus without complications: Secondary | ICD-10-CM | POA: Diagnosis not present

## 2015-02-08 LAB — BASIC METABOLIC PANEL
Anion gap: 9 (ref 5–15)
BUN: 23 mg/dL — ABNORMAL HIGH (ref 6–20)
CO2: 29 mmol/L (ref 22–32)
Calcium: 8.9 mg/dL (ref 8.9–10.3)
Chloride: 96 mmol/L — ABNORMAL LOW (ref 101–111)
Creatinine, Ser: 7.34 mg/dL — ABNORMAL HIGH (ref 0.44–1.00)
GFR calc Af Amer: 6 mL/min — ABNORMAL LOW (ref >60)
GFR calc non Af Amer: 6 mL/min — ABNORMAL LOW (ref >60)
Glucose, Bld: 60 mg/dL — ABNORMAL LOW (ref 65–99)
Potassium: 5.3 mmol/L — ABNORMAL HIGH (ref 3.5–5.1)
Sodium: 134 mmol/L — ABNORMAL LOW (ref 135–145)

## 2015-02-08 LAB — CBC
HCT: 39.5 % (ref 35.0–47.0)
Hemoglobin: 12.4 g/dL (ref 12.0–16.0)
MCH: 27.5 pg (ref 26.0–34.0)
MCHC: 31.4 g/dL — ABNORMAL LOW (ref 32.0–36.0)
MCV: 87.5 fL (ref 80.0–100.0)
Platelets: 172 10*3/uL (ref 150–440)
RBC: 4.52 MIL/uL (ref 3.80–5.20)
RDW: 20.1 % — ABNORMAL HIGH (ref 11.5–14.5)
WBC: 4.7 10*3/uL (ref 3.6–11.0)

## 2015-02-08 LAB — TROPONIN I: Troponin I: <0.03 ng/mL (ref <0.031)

## 2015-02-08 LAB — BRAIN NATRIURETIC PEPTIDE: B Natriuretic Peptide: 682 pg/mL — ABNORMAL HIGH (ref 0.0–100.0)

## 2015-02-08 MED ORDER — ONDANSETRON HCL 4 MG PO TABS: 4.0000 mg | ORAL_TABLET | Freq: Every day | ORAL | Status: DC | PRN

## 2015-02-08 MED ORDER — HYDROCODONE-ACETAMINOPHEN 5-325 MG PO TABS: 2.0000 | ORAL_TABLET | Freq: Once | ORAL | Status: DC

## 2015-02-08 MED ORDER — HYDROCODONE-ACETAMINOPHEN 5-325 MG PO TABS: 1.0000 | ORAL_TABLET | Freq: Four times a day (QID) | ORAL | Status: DC | PRN

## 2015-02-08 MED ORDER — ONDANSETRON 4 MG PO TBDP
ORAL_TABLET | ORAL | Status: AC
  Administered 2015-02-08: 4 mg
  Filled 2015-02-08: qty 1

## 2015-02-08 NOTE — ED Provider Notes (Addendum)
[facility] Emergency Department Provider Note     Time seen: 1500  I have reviewed the triage vital signs and the nursing notes.   HISTORY  Chief Complaint Shortness of Breath and Chest Pain    HPI [name] is a 54 y.o. female who presents ER for chest shortness breath and chest pain.Patient states the chest is sore to touch. She missed dialysis on Wednesday, and she has made sure she had did not eat or drink too much. Symptom onset was this morning. She states she is only about 1 kg over her dry weight. Patient states she feels better currently, no fevers chills or other complaints.     Past Medical History  Diagnosis Date  . Asthma   . Collagen vascular disease   . Diabetes mellitus without complication   . Hypertension   . Renal insufficiency dialysis     3 days weekly mon wed friday    There are no active problems to display for this patient.   History reviewed. No pertinent past surgical history.  No current outpatient prescriptions on file.  Allergies Ace inhibitors; Codeine; Oxycodone; and Tape  No family history on file.  Social History History  Substance Use Topics  . Smoking status: Current Every Day Smoker -- 0.50 packs/day    Types: Cigarettes  . Smokeless tobacco: Never Used  . Alcohol Use: No   Review of Systems Constitutional: Negative for fever. Eyes: Negative for visual changes. ENT: Negative for sore throat. Cardiovascular: Positive for chest pain Respiratory: As shortness of breath Gastrointestinal: Negative for abdominal pain, vomiting and diarrhea. Genitourinary: Negative for dysuria. Musculoskeletal: Negative for back pain. Skin: Negative for rash. Neurological: Negative for headaches, focal weakness or numbness.  10-point ROS otherwise negative.  ____________________________________________   PHYSICAL EXAM:  VITAL SIGNS: ED Triage Vitals  Enc Vitals Group     BP 02/08/15 1141 134/54 mmHg      Pulse Rate 02/08/15 1141 56     Resp 02/08/15 1141 18     Temp 02/08/15 1141 97.6 F (36.4 C)     Temp Source 02/08/15 1141 Oral     SpO2 02/08/15 1141 100 %     Weight 02/08/15 1141 198 lb (89.812 kg)     Height 02/08/15 1141 5\' 6"  (1.676 m)     Head Cir --      Peak Flow --      Pain Score 02/08/15 1142 9     Pain Loc --      Pain Edu? --      Excl. in [place]? --     Constitutional: Alert and oriented. Well appearing and in no distress. Eyes: Conjunctivae are normal. PERRL. Normal extraocular movements. ENT   Head: Normocephalic and atraumatic.   Nose: No congestion/rhinnorhea.   Mouth/Throat: Mucous membranes are moist.   Neck: No stridor. Hematological/Lymphatic/Immunilogical: No cervical lymphadenopathy. Cardiovascular: Normal rate, regular rhythm. Normal and symmetric distal pulses are present in all extremities. No murmurs, rubs, or gallops. Respiratory: Normal respiratory effort without tachypnea nor retractions. Breath sounds are clear and equal bilaterally. No wheezes/rales/rhonchi. Gastrointestinal: Soft and nontender. No distention. No abdominal bruits. There is no CVA tenderness. Musculoskeletal: Nontender with normal range of motion in all extremities. No joint effusions.  No lower extremity tenderness nor edema. Patient with reproducible left chest wall tenderness. Neurologic:  Normal speech and language. No gross focal neurologic deficits are appreciated. Speech is normal. No gait instability. Skin:  Skin is warm, dry and  intact. No rash noted. Psychiatric: Mood and affect are normal. Speech and behavior are normal. Patient exhibits appropriate insight and judgment.  ____________________________________________    LABS (pertinent positives/negatives)  Labs Reviewed  CBC - Abnormal; Notable for the following:    MCHC 31.4 (*)    RDW 20.1 (*)    All other components within normal limits  BRAIN NATRIURETIC PEPTIDE - Abnormal; Notable for the following:     B Natriuretic Peptide 682.0 (*)    All other components within normal limits  BASIC METABOLIC PANEL - Abnormal; Notable for the following:    Sodium 134 (*)    Potassium 5.3 (*)    Chloride 96 (*)    Glucose, Bld 60 (*)    BUN 23 (*)    Creatinine, Ser 7.34 (*)    GFR calc non Af Amer 6 (*)    GFR calc Af Amer 6 (*)    All other components within normal limits  TROPONIN I    ____________________________________________  ED COURSE:  Pertinent labs & imaging results that were available during my care of the patient were reviewed by me and considered in my medical decision making (see chart for details).  We'll check basic labs, reevaluate ____________________________________________   RADIOLOGY  Lungs are clear to auscultation currently, no need for chest x-ray this time  ____________________________________________ EKG: Sinus bradycardia with rate of 55, low voltage, normal axis normal intervals no evidence of hypertrophy or acute infarction. Interpreted by me.   FINAL ASSESSMENT AND PLAN  Chest pain,  shortness of breath, end-stage renal disease  Plan: Patient appears stable for dialysis in the morning. She is in no acute distress, chest wall is reproducibly tender. Pain medicine was given by mouth by mouth, vital signs have been stable here. Patient agrees with plan.    Earleen Newport, MD   Earleen Newport, MD 02/08/15 [place], MD 02/08/15 [phone]

## 2015-02-08 NOTE — Discharge Instructions (Signed)
Chest Wall Pain Chest wall pain is pain in or around the bones and muscles of your chest. It may take up to 6 weeks to get better. It may take longer if you must stay physically active in your work and activities.  CAUSES  Chest wall pain may happen on its own. However, it may be caused by:  A viral illness like the flu.  Injury.  Coughing.  Exercise.  Arthritis.  Fibromyalgia.  Shingles. HOME CARE INSTRUCTIONS   Avoid overtiring physical activity. Try not to strain or perform activities that cause pain. This includes any activities using your chest or your abdominal and side muscles, especially if heavy weights are used.  Put ice on the sore area.  Put ice in a plastic bag.  Place a towel between your skin and the bag.  Leave the ice on for 15-20 minutes per hour while awake for the first 2 days.  Only take over-the-counter or prescription medicines for pain, discomfort, or fever as directed by your caregiver. SEEK IMMEDIATE MEDICAL CARE IF:   Your pain increases, or you are very uncomfortable.  You have a fever.  Your chest pain becomes worse.  You have new, unexplained symptoms.  You have nausea or vomiting.  You feel sweaty or lightheaded.  You have a cough with phlegm (sputum), or you cough up blood. MAKE SURE YOU:   Understand these instructions.  Will watch your condition.  Will get help right away if you are not doing well or get worse. Document Released: 09/08/2005 Document Revised: 12/01/2011 Document Reviewed: 05/05/2011 [facility] Patient Information 2015 [place], [place]. This information is not intended to replace advice given to you by your health care provider. Make sure you discuss any questions you have with your health care provider.  End-Stage Kidney Disease The kidneys are two organs that lie on either side of the spine between the middle of the back and the front of the abdomen. The kidneys:   Remove wastes and extra water from the blood.    Produce important hormones. These help keep bones strong, regulate blood pressure, and help create red blood cells.   Balance the fluids and chemicals in the blood and tissues. End-stage kidney disease occurs when the kidneys are so damaged that they cannot do their job. When the kidneys cannot do their job, life-threatening problems occur. The body cannot stay clean and strong without the help of the kidneys. In end-stage kidney disease, the kidneys cannot get better.You need a new kidney or treatments to do some of the work healthy kidneys do in order to stay alive. CAUSES  End-stage kidney disease usually occurs when a long-lasting (chronic) kidney disease gets worse. It may also occur after the kidneys are suddenly damaged (acute kidney injury).  SYMPTOMS   Swelling (edema) of the legs, ankles, or feet.   Tiredness (lethargy).   Nausea or vomiting.   Confusion.   Problems with urination, such as:   Decreased urine production.   Frequent urination, especially at night.   Frequent accidents in children who are potty trained.   Muscle twitches and cramps.   Persistent itchiness.   Loss of appetite.   Headaches.   Abnormally dark or light skin.   Numbness in the hands or feet.   Easy bruising.   Frequent hiccups.   Menstruation stops. DIAGNOSIS  Your health care provider will measure your blood pressure and take some tests. These may include:   Urine tests.   Blood tests.   Imaging  tests, such as:   An ultrasound exam.   Computed tomography (CT).  A kidney biopsy. TREATMENT  There are two treatments for end-stage kidney disease:   A procedure that removes toxic wastes from the body (dialysis).   Receiving a new kidney (kidney transplant). Both of these treatments have serious risks and consequences. Your health care provider will help you determine which treatment is best for you based on your health, age, and other factors. In  addition to having dialysis or a kidney transplant, you may need to take medicines to control high blood pressure (hypertension) and cholesterol and to decrease phosphorus levels in your blood.  HOME CARE INSTRUCTIONS  Follow your prescribed diet.   Take medicines only as directed by your health care provider.   Do not take any new medicines (prescription, over-the-counter, or nutritional supplements) unless approved by your health care provider. Many medicines can worsen your kidney damage or need to have the dose adjusted.   Keep all follow-up visits as directed by your health care provider. MAKE SURE YOU:  Understand these instructions.  Will watch your condition.  Will get help right away if you are not doing well or get worse. Document Released: 11/29/2003 Document Revised: 01/23/2014 Document Reviewed: 05/07/2012 [facility] Patient Information 2015 [place], [place]. This information is not intended to replace advice given to you by your health care provider. Make sure you discuss any questions you have with your health care provider.

## 2015-02-08 NOTE — ED Notes (Signed)
Pt. Arrived to ed from home with reports of SOB and Chest Pain. Pt and pt's daughter who is in triage reports that pt has been experiencing SOB and has been placed on albuterol trreatments. Pt reports no improvement and states "it got worse today". Pt alert and oriented.  No acute distress noted at this time.

## 2015-02-17 ENCOUNTER — Emergency Department
Admission: EM | Admit: 2015-02-17 | Discharge: 2015-02-17 | Disposition: A | Discharge status: Home / Self Care | Payer: Medicare Other | Source: Home / Self Care | Attending: Emergency Medicine | Admitting: Emergency Medicine

## 2015-02-17 ENCOUNTER — Other Ambulatory Visit: Payer: Self-pay

## 2015-02-17 ENCOUNTER — Encounter: Payer: Self-pay | Admitting: Emergency Medicine

## 2015-02-17 ENCOUNTER — Emergency Department: Payer: Medicare Other

## 2015-02-17 DIAGNOSIS — R112 Nausea with vomiting, unspecified: Secondary | ICD-10-CM | POA: Insufficient documentation

## 2015-02-17 DIAGNOSIS — Z720 Tobacco use: Secondary | ICD-10-CM | POA: Insufficient documentation

## 2015-02-17 DIAGNOSIS — J159 Unspecified bacterial pneumonia: Secondary | ICD-10-CM

## 2015-02-17 DIAGNOSIS — Z79899 Other long term (current) drug therapy: Secondary | ICD-10-CM

## 2015-02-17 DIAGNOSIS — E1121 Type 2 diabetes mellitus with diabetic nephropathy: Secondary | ICD-10-CM | POA: Diagnosis present

## 2015-02-17 DIAGNOSIS — D631 Anemia in chronic kidney disease: Secondary | ICD-10-CM | POA: Diagnosis present

## 2015-02-17 DIAGNOSIS — K219 Gastro-esophageal reflux disease without esophagitis: Secondary | ICD-10-CM | POA: Diagnosis present

## 2015-02-17 DIAGNOSIS — J189 Pneumonia, unspecified organism: Secondary | ICD-10-CM | POA: Diagnosis not present

## 2015-02-17 DIAGNOSIS — J45901 Unspecified asthma with (acute) exacerbation: Secondary | ICD-10-CM

## 2015-02-17 DIAGNOSIS — I2510 Atherosclerotic heart disease of native coronary artery without angina pectoris: Secondary | ICD-10-CM | POA: Diagnosis present

## 2015-02-17 DIAGNOSIS — Z992 Dependence on renal dialysis: Secondary | ICD-10-CM | POA: Insufficient documentation

## 2015-02-17 DIAGNOSIS — E119 Type 2 diabetes mellitus without complications: Secondary | ICD-10-CM | POA: Insufficient documentation

## 2015-02-17 DIAGNOSIS — Z7951 Long term (current) use of inhaled steroids: Secondary | ICD-10-CM

## 2015-02-17 DIAGNOSIS — Z888 Allergy status to other drugs, medicaments and biological substances status: Secondary | ICD-10-CM

## 2015-02-17 DIAGNOSIS — Z7982 Long term (current) use of aspirin: Secondary | ICD-10-CM

## 2015-02-17 DIAGNOSIS — I998 Other disorder of circulatory system: Secondary | ICD-10-CM | POA: Diagnosis present

## 2015-02-17 DIAGNOSIS — I120 Hypertensive chronic kidney disease with stage 5 chronic kidney disease or end stage renal disease: Secondary | ICD-10-CM | POA: Insufficient documentation

## 2015-02-17 DIAGNOSIS — E11319 Type 2 diabetes mellitus with unspecified diabetic retinopathy without macular edema: Secondary | ICD-10-CM | POA: Diagnosis present

## 2015-02-17 DIAGNOSIS — Z794 Long term (current) use of insulin: Secondary | ICD-10-CM

## 2015-02-17 DIAGNOSIS — R0902 Hypoxemia: Secondary | ICD-10-CM | POA: Diagnosis not present

## 2015-02-17 DIAGNOSIS — Z792 Long term (current) use of antibiotics: Secondary | ICD-10-CM

## 2015-02-17 DIAGNOSIS — J90 Pleural effusion, not elsewhere classified: Secondary | ICD-10-CM | POA: Diagnosis present

## 2015-02-17 DIAGNOSIS — I5030 Unspecified diastolic (congestive) heart failure: Secondary | ICD-10-CM | POA: Diagnosis present

## 2015-02-17 DIAGNOSIS — N186 End stage renal disease: Secondary | ICD-10-CM | POA: Insufficient documentation

## 2015-02-17 DIAGNOSIS — Z7901 Long term (current) use of anticoagulants: Secondary | ICD-10-CM

## 2015-02-17 DIAGNOSIS — I252 Old myocardial infarction: Secondary | ICD-10-CM

## 2015-02-17 DIAGNOSIS — J449 Chronic obstructive pulmonary disease, unspecified: Secondary | ICD-10-CM | POA: Diagnosis present

## 2015-02-17 DIAGNOSIS — K3184 Gastroparesis: Secondary | ICD-10-CM | POA: Diagnosis present

## 2015-02-17 HISTORY — DX: Personal history of other diseases of the digestive system: Z87.19

## 2015-02-17 HISTORY — DX: Type 2 diabetes mellitus with diabetic neuropathy, unspecified: E11.40

## 2015-02-17 HISTORY — DX: Gastro-esophageal reflux disease without esophagitis: K21.9

## 2015-02-17 HISTORY — DX: Dependence on renal dialysis: Z99.2

## 2015-02-17 HISTORY — DX: Atherosclerotic heart disease of native coronary artery without angina pectoris: I25.10

## 2015-02-17 HISTORY — DX: End stage renal disease: N18.6

## 2015-02-17 LAB — CBC WITH DIFFERENTIAL/PLATELET
Basophils Absolute: 0.1 10*3/uL (ref 0–0.1)
Basophils Relative: 1 %
Eosinophils Absolute: 0.3 10*3/uL (ref 0–0.7)
Eosinophils Relative: 6 %
HCT: 37.2 % (ref 35.0–47.0)
Hemoglobin: 11.9 g/dL — ABNORMAL LOW (ref 12.0–16.0)
Lymphocytes Relative: 33 %
Lymphs Abs: 1.8 10*3/uL (ref 1.0–3.6)
MCH: 27.5 pg (ref 26.0–34.0)
MCHC: 32 g/dL (ref 32.0–36.0)
MCV: 85.8 fL (ref 80.0–100.0)
Monocytes Absolute: 0.5 10*3/uL (ref 0.2–0.9)
Monocytes Relative: 8 %
Neutro Abs: 2.9 10*3/uL (ref 1.4–6.5)
Neutrophils Relative %: 52 %
Platelets: 135 10*3/uL — ABNORMAL LOW (ref 150–440)
RBC: 4.34 MIL/uL (ref 3.80–5.20)
RDW: 19.2 % — ABNORMAL HIGH (ref 11.5–14.5)
WBC: 5.6 10*3/uL (ref 3.6–11.0)

## 2015-02-17 LAB — BRAIN NATRIURETIC PEPTIDE: B Natriuretic Peptide: 729 pg/mL — ABNORMAL HIGH (ref 0.0–100.0)

## 2015-02-17 LAB — COMPREHENSIVE METABOLIC PANEL
ALT: 16 U/L (ref 14–54)
AST: 20 U/L (ref 15–41)
Albumin: 4 g/dL (ref 3.5–5.0)
Alkaline Phosphatase: 124 U/L (ref 38–126)
Anion gap: 11 (ref 5–15)
BUN: 23 mg/dL — ABNORMAL HIGH (ref 6–20)
CO2: 28 mmol/L (ref 22–32)
Calcium: 9 mg/dL (ref 8.9–10.3)
Chloride: 95 mmol/L — ABNORMAL LOW (ref 101–111)
Creatinine, Ser: 7.72 mg/dL — ABNORMAL HIGH (ref 0.44–1.00)
GFR calc Af Amer: 6 mL/min — ABNORMAL LOW (ref >60)
GFR calc non Af Amer: 5 mL/min — ABNORMAL LOW (ref >60)
Glucose, Bld: 123 mg/dL — ABNORMAL HIGH (ref 65–99)
Potassium: 4 mmol/L (ref 3.5–5.1)
Sodium: 134 mmol/L — ABNORMAL LOW (ref 135–145)
Total Bilirubin: 0.6 mg/dL (ref 0.3–1.2)
Total Protein: 7.4 g/dL (ref 6.5–8.1)

## 2015-02-17 LAB — LIPASE, BLOOD: Lipase: 28 U/L (ref 22–51)

## 2015-02-17 LAB — MAGNESIUM: Magnesium: 2.6 mg/dL — ABNORMAL HIGH (ref 1.7–2.4)

## 2015-02-17 MED ORDER — LEVOFLOXACIN 500 MG PO TABS
ORAL_TABLET | ORAL | Status: AC
  Filled 2015-02-17: qty 1

## 2015-02-17 MED ORDER — LEVOFLOXACIN 500 MG PO TABS: ORAL_TABLET | ORAL | Status: AC

## 2015-02-17 MED ORDER — ACETAMINOPHEN 500 MG PO TABS
ORAL_TABLET | ORAL | Status: AC
  Filled 2015-02-17: qty 2

## 2015-02-17 MED ORDER — ALBUTEROL SULFATE HFA 108 (90 BASE) MCG/ACT IN AERS: INHALATION_SPRAY | RESPIRATORY_TRACT | Status: DC

## 2015-02-17 MED ORDER — IPRATROPIUM-ALBUTEROL 0.5-2.5 (3) MG/3ML IN SOLN
3.0000 mL | Freq: Once | RESPIRATORY_TRACT | Status: AC
  Administered 2015-02-17: 3 mL via RESPIRATORY_TRACT

## 2015-02-17 MED ORDER — ACETAMINOPHEN 500 MG PO TABS
1000.0000 mg | ORAL_TABLET | Freq: Once | ORAL | Status: AC
  Administered 2015-02-17: 1000 mg via ORAL

## 2015-02-17 MED ORDER — LEVOFLOXACIN 750 MG PO TABS
750.0000 mg | ORAL_TABLET | ORAL | Status: AC
  Administered 2015-02-17: 750 mg via ORAL

## 2015-02-17 MED ORDER — LEVOFLOXACIN 250 MG PO TABS
ORAL_TABLET | ORAL | Status: AC
  Filled 2015-02-17: qty 1

## 2015-02-17 MED ORDER — IPRATROPIUM-ALBUTEROL 0.5-2.5 (3) MG/3ML IN SOLN
RESPIRATORY_TRACT | Status: AC
  Filled 2015-02-17: qty 3

## 2015-02-17 NOTE — ED Notes (Signed)
Pt reporting SOB X 2 weeks and HA X2 months. Pt reporting N/V and pain and burning under left breast. Pt missed HD treatment yesterday, blood glucose low at 49 when EMS arrived.

## 2015-02-17 NOTE — ED Notes (Signed)
Patient is resting comfortably. Unable to get PIV due to poor venous access, labs drawn and sent.

## 2015-02-17 NOTE — Discharge Instructions (Signed)
As we discussed, your workup was generally reassuring today except for a possible small area of infection in your right lung.  Your labs looked good even though you missed dialysis yesterday.  Please go to dialysis as scheduled and follow up with your regular doctor at the next available opportunity.  Continue to use your home albuterol inhaler as needed for shortness of breath.  If you develop new or worsening symptoms that concern you, please return to the emergency department.  Please read the instructions on your antibiotics carefully as they have been dosed specifically for someone who is on dialysis.   Pneumonia, Adult Pneumonia is an infection of the lungs. It may be caused by a germ (virus or bacteria). Some types of pneumonia can spread easily from person to person. This can happen when you cough or sneeze. HOME CARE  Only take medicine as told by your doctor.  Take your medicine (antibiotics) as told. Finish it even if you start to feel better.  Do not smoke.  You may use a vaporizer or humidifier in your room. This can help loosen thick spit (mucus).  Sleep so you are almost sitting up (semi-upright). This helps reduce coughing.  Rest. A shot (vaccine) can help prevent pneumonia. Shots are often advised for:  People over 74 years old.  Patients on chemotherapy.  People with long-term (chronic) lung problems.  People with immune system problems. GET HELP RIGHT AWAY IF:   You are getting worse.  You cannot control your cough, and you are losing sleep.  You cough up blood.  Your pain gets worse, even with medicine.  You have a fever.  Any of your problems are getting worse, not better.  You have shortness of breath or chest pain. MAKE SURE YOU:   Understand these instructions.  Will watch your condition.  Will get help right away if you are not doing well or get worse. Document Released: 02/25/2008 Document Revised: 12/01/2011 Document Reviewed:  11/29/2010 [facility] Patient Information 2015 [place], [place]. This information is not intended to replace advice given to you by your health care provider. Make sure you discuss any questions you have with your health care provider.

## 2015-02-17 NOTE — ED Provider Notes (Signed)
[facility] Emergency Department Provider Note  ____________________________________________  Time seen: Approximately 5:34 PM  I have reviewed the triage vital signs and the nursing notes.   HISTORY  Chief Complaint Shortness of breath    HPI [name] is a 54 y.o. female with an extensive past medical history that includes COPD, end-stage renal disease on dialysis Monday Wednesday Friday, CAD with one cardiac stent,and diabetes who presents with shortness of breath for about a week.  This is been slowly worsening and she describes today is the worst.  She has been compliant with her dialysis until yesterday which she missed.  She describes some nausea and vomiting but this also is apparently common for her.  She describes the shortness of breath as moderate, which nothing makes better and nothing makes worse.  She still make urine but denies taking any "fluid pills".  She has been occasionally using her home albuterol to help with her shortness of breath and it helps a little bit.  She is currently in no distress.    Past Medical History  Diagnosis Date  . Asthma   . Collagen vascular disease   . Diabetes mellitus without complication   . Hypertension   . Renal insufficiency dialysis     3 days weekly mon wed friday  . Coronary artery disease     stent  . Diabetic neuropathy   . ESRD (end stage renal disease) on dialysis     M-W-F  . GERD (gastroesophageal reflux disease)   . Hx of pancreatitis 2015    There are no active problems to display for this patient.   Past Surgical History  Procedure Laterality Date  . Cholecystectomy    . Appendectomy    . Abdominal hysterectomy      Current Outpatient Rx  Name  Route  Sig  Dispense  Refill  . albuterol (PROVENTIL HFA;VENTOLIN HFA) 108 (90 BASE) MCG/ACT inhaler      Inhale 4-6 puffs by mouth every 4 hours as needed for wheezing, cough, and/or shortness of breath   1 Inhaler   1   .  HYDROcodone-acetaminophen (NORCO/VICODIN) 5-325 MG per tablet   Oral   Take 1 tablet by mouth every 6 (six) hours as needed for moderate pain.   20 tablet   0   . levofloxacin (LEVAQUIN) 500 MG tablet      Take 1 tablet by mouth every 48 hours starting on 02/19/2015   5 tablet   0   . ondansetron (ZOFRAN) 4 MG tablet   Oral   Take 1 tablet (4 mg total) by mouth daily as needed for nausea or vomiting.   20 tablet   1     Allergies Ace inhibitors; Codeine; Oxycodone; and Tape  History reviewed. No pertinent family history.  Social History History  Substance Use Topics  . Smoking status: Current Every Day Smoker -- 0.50 packs/day    Types: Cigarettes  . Smokeless tobacco: Never Used  . Alcohol Use: No    Review of Systems Constitutional: No fever/chills Eyes: No visual changes. ENT: No sore throat. Cardiovascular: Denies chest pain. Respiratory: shortness of breath as described above Gastrointestinal: No abdominal pain.  Occasional nausea and vomiting.  No diarrhea.  No constipation. Genitourinary: Negative for dysuria. Musculoskeletal: Negative for back pain. Skin: Negative for rash. Neurological: Negative for headaches, focal weakness or numbness.  10-point ROS otherwise negative.  ____________________________________________   PHYSICAL EXAM:  VITAL SIGNS: ED Triage Vitals  Enc Vitals  Group     BP 02/17/15 1730 154/72 mmHg     Pulse Rate 02/17/15 1730 70     Resp --      Temp 02/17/15 1730 97.4 F (36.3 C)     Temp Source 02/17/15 1730 Oral     SpO2 02/17/15 1730 100 %     Weight --      Height --      Head Cir --      Peak Flow --      Pain Score 02/17/15 1730 8     Pain Loc --      Pain Edu? --      Excl. in [place]? --     Constitutional: Alert and oriented. Well appearing and in no acute distress.  Calm with flat affect. Eyes: Conjunctivae are normal. PERRL. EOMI. Head: Atraumatic. Nose: No congestion/rhinnorhea. Mouth/Throat: Mucous membranes  are moist.  Oropharynx non-erythematous. Neck: No stridor.   Cardiovascular: Normal rate, regular rhythm. Grossly normal heart sounds.  Good peripheral circulation.  Dialysis uses left upper extremity Respiratory: Normal respiratory effort.  No retractions, no tachypnea.  Mild expiratory wheezes throughout with no rales or rhonchi Gastrointestinal: Obese, Soft and nontender. No distention. No abdominal bruits. No CVA tenderness. Musculoskeletal: Trace pitting edema and bilateral lower extremities.  No joint effusions. Neurologic:  Normal speech and language. No gross focal neurologic deficits are appreciated. Speech is normal. No gait instability. Skin:  Skin is warm, dry and intact. No rash noted. Psychiatric: Flat affect and mood. Speech and behavior are normal.  ____________________________________________   LABS (all labs ordered are listed, but only abnormal results are displayed)  Labs Reviewed  BRAIN NATRIURETIC PEPTIDE - Abnormal; Notable for the following:    B Natriuretic Peptide 729.0 (*)    All other components within normal limits  CBC WITH DIFFERENTIAL/PLATELET - Abnormal; Notable for the following:    Hemoglobin 11.9 (*)    RDW 19.2 (*)    Platelets 135 (*)    All other components within normal limits  COMPREHENSIVE METABOLIC PANEL - Abnormal; Notable for the following:    Sodium 134 (*)    Chloride 95 (*)    Glucose, Bld 123 (*)    BUN 23 (*)    Creatinine, Ser 7.72 (*)    GFR calc non Af Amer 5 (*)    GFR calc Af Amer 6 (*)    All other components within normal limits  MAGNESIUM - Abnormal; Notable for the following:    Magnesium 2.6 (*)    All other components within normal limits  LIPASE, BLOOD  CBC WITH DIFFERENTIAL/PLATELET  Serum Potassium = 4.0 ____________________________________________  EKG  ED ECG REPORT I, Annita Ratliff, the attending physician, personally viewed and interpreted this ECG.  Date: 02/17/2015 EKG Time: 19:34 Rate: 73 Rhythm:  normal sinus rhythm QRS Axis: normal Intervals: normal ST/T Wave abnormalities: normal Conduction Disutrbances: none Narrative Interpretation: unremarkable  ____________________________________________  RADIOLOGY  Dg Chest 2 View  02/17/2015   CLINICAL DATA:  Shortness of Breath  EXAM: CHEST  2 VIEW  COMPARISON:  05/30/2014  FINDINGS: Cardiac shadow it is mildly enlarged but stable. Stenting is noted throughout the left arm venous tree. Elevation of the right hemidiaphragm is again seen. Early right basilar infiltrate is identified predominately within the lower lobe. No other focal abnormality is noted.  IMPRESSION: Early right basilar infiltrate.   Electronically Signed   By: Inez Catalina M.D.   On: 02/17/2015 18:28   ____________________________________________  PROCEDURES  Procedure(s) performed: None  Critical Care performed: No ____________________________________________   INITIAL IMPRESSION / ASSESSMENT AND PLAN / ED COURSE  Pertinent labs & imaging results that were available during my care of the patient were reviewed by me and considered in my medical decision making (see chart for details).   5:41 PM My initial impression is that the patient is in no acute distress and has normal respiratory effort.  She has mild expiratory wheezing, and I will treat with a DuoNeb.  We will assess her volume status and check electrolytes given the missed dialysis.  At this time she requires no emergent intervention, and though she has multiple medical problems at baseline, her presentation today is reassuring  ----------------------------------------- 9:56 PM on 02/17/2015 -----------------------------------------  (Note that documentation was delayed due to multiple ED patients requiring immediate care.)  The patient's chest x-ray suggested a small early pneumonia in the right lower lobe.  However, the rest of her presentation is reassuring including normal vital signs, afebrile,  and no leukocytosis.  She is breathing comfortably with no retractions or tachypnea.  She has no evidence of volume overload and stated herself that she is feeling better.  I will treat her for community-acquired pneumonia with Levaquin given her comorbidities.  I dosed this for her end-stage renal disease (first dose of Levaquin 750 mg by mouth in the emergency department followed by Levaquin 500 mg every 48 hours for a total of 10 days).  I gave the patient strict return precautions and advised her to continue with dialysis as planned and to follow-up with her doctor at the next available opportunity.  She understands and agrees with this plan.    ____________________________________________  FINAL CLINICAL IMPRESSION(S) / ED DIAGNOSES  Final diagnoses:  Community acquired pneumonia      NEW MEDICATIONS STARTED DURING THIS VISIT:  Discharge Medication List as of 02/17/2015  9:03 PM    START taking these medications   Details  albuterol (PROVENTIL HFA;VENTOLIN HFA) 108 (90 BASE) MCG/ACT inhaler Inhale 4-6 puffs by mouth every 4 hours as needed for wheezing, cough, and/or shortness of breath, Print    levofloxacin (LEVAQUIN) 500 MG tablet Take 1 tablet by mouth every 48 hours starting on 02/19/2015, Print         Hinda Kehr, MD 02/17/15 2158

## 2015-02-19 ENCOUNTER — Emergency Department: Payer: Medicare Other

## 2015-02-19 ENCOUNTER — Other Ambulatory Visit: Payer: Self-pay

## 2015-02-19 ENCOUNTER — Inpatient Hospital Stay
Admission: EM | Admit: 2015-02-19 | Discharge: 2015-02-24 | DRG: 193 | Disposition: A | Discharge status: Home / Self Care | Payer: Medicare Other | Attending: Internal Medicine | Admitting: Internal Medicine

## 2015-02-19 DIAGNOSIS — I998 Other disorder of circulatory system: Secondary | ICD-10-CM | POA: Diagnosis present

## 2015-02-19 DIAGNOSIS — R0902 Hypoxemia: Secondary | ICD-10-CM

## 2015-02-19 DIAGNOSIS — J189 Pneumonia, unspecified organism: Secondary | ICD-10-CM | POA: Diagnosis present

## 2015-02-19 DIAGNOSIS — Z7901 Long term (current) use of anticoagulants: Secondary | ICD-10-CM | POA: Diagnosis not present

## 2015-02-19 DIAGNOSIS — N186 End stage renal disease: Secondary | ICD-10-CM | POA: Diagnosis present

## 2015-02-19 DIAGNOSIS — E119 Type 2 diabetes mellitus without complications: Secondary | ICD-10-CM | POA: Diagnosis present

## 2015-02-19 DIAGNOSIS — K3184 Gastroparesis: Secondary | ICD-10-CM

## 2015-02-19 DIAGNOSIS — J45901 Unspecified asthma with (acute) exacerbation: Secondary | ICD-10-CM | POA: Diagnosis present

## 2015-02-19 DIAGNOSIS — J159 Unspecified bacterial pneumonia: Secondary | ICD-10-CM | POA: Diagnosis present

## 2015-02-19 DIAGNOSIS — I2510 Atherosclerotic heart disease of native coronary artery without angina pectoris: Secondary | ICD-10-CM | POA: Diagnosis present

## 2015-02-19 DIAGNOSIS — J449 Chronic obstructive pulmonary disease, unspecified: Secondary | ICD-10-CM | POA: Diagnosis present

## 2015-02-19 DIAGNOSIS — R112 Nausea with vomiting, unspecified: Secondary | ICD-10-CM

## 2015-02-19 DIAGNOSIS — E1121 Type 2 diabetes mellitus with diabetic nephropathy: Secondary | ICD-10-CM | POA: Diagnosis present

## 2015-02-19 DIAGNOSIS — J90 Pleural effusion, not elsewhere classified: Secondary | ICD-10-CM | POA: Diagnosis present

## 2015-02-19 DIAGNOSIS — R188 Other ascites: Secondary | ICD-10-CM

## 2015-02-19 DIAGNOSIS — Z794 Long term (current) use of insulin: Secondary | ICD-10-CM | POA: Diagnosis not present

## 2015-02-19 DIAGNOSIS — I120 Hypertensive chronic kidney disease with stage 5 chronic kidney disease or end stage renal disease: Secondary | ICD-10-CM | POA: Diagnosis present

## 2015-02-19 DIAGNOSIS — Z7951 Long term (current) use of inhaled steroids: Secondary | ICD-10-CM | POA: Diagnosis not present

## 2015-02-19 DIAGNOSIS — R0600 Dyspnea, unspecified: Secondary | ICD-10-CM

## 2015-02-19 DIAGNOSIS — R109 Unspecified abdominal pain: Secondary | ICD-10-CM

## 2015-02-19 DIAGNOSIS — Z992 Dependence on renal dialysis: Secondary | ICD-10-CM | POA: Diagnosis not present

## 2015-02-19 DIAGNOSIS — E11319 Type 2 diabetes mellitus with unspecified diabetic retinopathy without macular edema: Secondary | ICD-10-CM | POA: Diagnosis present

## 2015-02-19 DIAGNOSIS — K219 Gastro-esophageal reflux disease without esophagitis: Secondary | ICD-10-CM | POA: Diagnosis present

## 2015-02-19 DIAGNOSIS — Z720 Tobacco use: Secondary | ICD-10-CM | POA: Diagnosis not present

## 2015-02-19 DIAGNOSIS — Z888 Allergy status to other drugs, medicaments and biological substances status: Secondary | ICD-10-CM | POA: Diagnosis not present

## 2015-02-19 DIAGNOSIS — I5030 Unspecified diastolic (congestive) heart failure: Secondary | ICD-10-CM | POA: Diagnosis present

## 2015-02-19 DIAGNOSIS — Z9889 Other specified postprocedural states: Secondary | ICD-10-CM

## 2015-02-19 DIAGNOSIS — I252 Old myocardial infarction: Secondary | ICD-10-CM | POA: Diagnosis not present

## 2015-02-19 DIAGNOSIS — Z79899 Other long term (current) drug therapy: Secondary | ICD-10-CM | POA: Diagnosis not present

## 2015-02-19 DIAGNOSIS — Z792 Long term (current) use of antibiotics: Secondary | ICD-10-CM | POA: Diagnosis not present

## 2015-02-19 DIAGNOSIS — R7881 Bacteremia: Secondary | ICD-10-CM | POA: Diagnosis present

## 2015-02-19 DIAGNOSIS — D631 Anemia in chronic kidney disease: Secondary | ICD-10-CM | POA: Diagnosis present

## 2015-02-19 DIAGNOSIS — B952 Enterococcus as the cause of diseases classified elsewhere: Secondary | ICD-10-CM | POA: Diagnosis present

## 2015-02-19 DIAGNOSIS — Z7982 Long term (current) use of aspirin: Secondary | ICD-10-CM | POA: Diagnosis not present

## 2015-02-19 HISTORY — DX: Acute myocardial infarction, unspecified: I21.9

## 2015-02-19 HISTORY — DX: Pneumonia, unspecified organism: J18.9

## 2015-02-19 LAB — CBC
HCT: 35.8 % (ref 35.0–47.0)
Hemoglobin: 11.4 g/dL — ABNORMAL LOW (ref 12.0–16.0)
MCH: 27.2 pg (ref 26.0–34.0)
MCHC: 31.8 g/dL — ABNORMAL LOW (ref 32.0–36.0)
MCV: 85.4 fL (ref 80.0–100.0)
Platelets: 141 10*3/uL — ABNORMAL LOW (ref 150–440)
RBC: 4.19 MIL/uL (ref 3.80–5.20)
RDW: 19.5 % — ABNORMAL HIGH (ref 11.5–14.5)
WBC: 5.3 10*3/uL (ref 3.6–11.0)

## 2015-02-19 LAB — BASIC METABOLIC PANEL
Anion gap: 7 (ref 5–15)
BUN: 11 mg/dL (ref 6–20)
CO2: 31 mmol/L (ref 22–32)
Calcium: 8.3 mg/dL — ABNORMAL LOW (ref 8.9–10.3)
Chloride: 99 mmol/L — ABNORMAL LOW (ref 101–111)
Creatinine, Ser: 4.77 mg/dL — ABNORMAL HIGH (ref 0.44–1.00)
GFR calc Af Amer: 11 mL/min — ABNORMAL LOW (ref >60)
GFR calc non Af Amer: 9 mL/min — ABNORMAL LOW (ref >60)
Glucose, Bld: 82 mg/dL (ref 65–99)
Potassium: 3.5 mmol/L (ref 3.5–5.1)
Sodium: 137 mmol/L (ref 135–145)

## 2015-02-19 LAB — TROPONIN I
Troponin I: <0.03 ng/mL (ref <0.031)
Troponin I: <0.03 ng/mL (ref <0.031)

## 2015-02-19 LAB — CREATININE, SERUM
Creatinine, Ser: 4.78 mg/dL — ABNORMAL HIGH (ref 0.44–1.00)
GFR calc Af Amer: 11 mL/min — ABNORMAL LOW (ref >60)
GFR calc non Af Amer: 9 mL/min — ABNORMAL LOW (ref >60)

## 2015-02-19 MED ORDER — SUCRALFATE 1 GM/10ML PO SUSP
1.0000 g | Freq: Three times a day (TID) | ORAL | Status: DC
  Administered 2015-02-19 – 2015-02-24 (×11): 1 g via ORAL
  Filled 2015-02-19 (×15): qty 10

## 2015-02-19 MED ORDER — PANTOPRAZOLE SODIUM 40 MG PO TBEC
40.0000 mg | DELAYED_RELEASE_TABLET | Freq: Two times a day (BID) | ORAL | Status: DC
  Administered 2015-02-19 – 2015-02-24 (×9): 40 mg via ORAL
  Filled 2015-02-19 (×10): qty 1

## 2015-02-19 MED ORDER — PIPERACILLIN-TAZOBACTAM 3.375 G IVPB
3.3750 g | Freq: Once | INTRAVENOUS | Status: AC
  Administered 2015-02-19: 3.375 g via INTRAVENOUS

## 2015-02-19 MED ORDER — LEVOFLOXACIN IN D5W 500 MG/100ML IV SOLN
500.0000 mg | INTRAVENOUS | Status: DC
  Administered 2015-02-21 – 2015-02-23 (×2): 500 mg via INTRAVENOUS
  Filled 2015-02-19 (×2): qty 100

## 2015-02-19 MED ORDER — ONDANSETRON HCL 4 MG/2ML IJ SOLN
INTRAMUSCULAR | Status: AC
  Administered 2015-02-19: 4 mg via INTRAVENOUS
  Filled 2015-02-19: qty 2

## 2015-02-19 MED ORDER — HYDROMORPHONE HCL 1 MG/ML IJ SOLN
0.5000 mg | Freq: Once | INTRAMUSCULAR | Status: AC
  Administered 2015-02-19: 0.5 mg via INTRAVENOUS

## 2015-02-19 MED ORDER — ONDANSETRON HCL 4 MG PO TABS
4.0000 mg | ORAL_TABLET | Freq: Four times a day (QID) | ORAL | Status: DC | PRN
  Administered 2015-02-19 – 2015-02-20 (×2): 4 mg via ORAL
  Filled 2015-02-19 (×2): qty 1

## 2015-02-19 MED ORDER — ACETAMINOPHEN 325 MG PO TABS
650.0000 mg | ORAL_TABLET | Freq: Four times a day (QID) | ORAL | Status: DC | PRN
  Administered 2015-02-19 – 2015-02-22 (×3): 650 mg via ORAL
  Filled 2015-02-19 (×5): qty 2

## 2015-02-19 MED ORDER — LEVOFLOXACIN IN D5W 750 MG/150ML IV SOLN
750.0000 mg | Freq: Once | INTRAVENOUS | Status: AC
  Administered 2015-02-19: 18:00:00 750 mg via INTRAVENOUS
  Filled 2015-02-19: qty 150

## 2015-02-19 MED ORDER — ONDANSETRON HCL 4 MG/2ML IJ SOLN
4.0000 mg | Freq: Once | INTRAMUSCULAR | Status: AC
  Administered 2015-02-19: 4 mg via INTRAVENOUS

## 2015-02-19 MED ORDER — POLYETHYLENE GLYCOL 3350 17 G PO PACK: 17.0000 g | PACK | Freq: Every day | ORAL | Status: DC | PRN

## 2015-02-19 MED ORDER — PIPERACILLIN-TAZOBACTAM 3.375 G IVPB
INTRAVENOUS | Status: AC
  Administered 2015-02-19: 3.375 g via INTRAVENOUS
  Filled 2015-02-19: qty 50

## 2015-02-19 MED ORDER — HEPARIN SODIUM (PORCINE) 5000 UNIT/ML IJ SOLN
5000.0000 [IU] | Freq: Three times a day (TID) | INTRAMUSCULAR | Status: DC
  Administered 2015-02-19 – 2015-02-24 (×13): 5000 [IU] via SUBCUTANEOUS
  Filled 2015-02-19 (×14): qty 1

## 2015-02-19 MED ORDER — ONDANSETRON HCL 4 MG/2ML IJ SOLN
4.0000 mg | Freq: Four times a day (QID) | INTRAMUSCULAR | Status: DC | PRN
  Administered 2015-02-19 – 2015-02-20 (×3): 4 mg via INTRAVENOUS
  Filled 2015-02-19 (×4): qty 2

## 2015-02-19 MED ORDER — ACETAMINOPHEN 650 MG RE SUPP: 650.0000 mg | Freq: Four times a day (QID) | RECTAL | Status: DC | PRN

## 2015-02-19 MED ORDER — HYDROMORPHONE HCL 1 MG/ML IJ SOLN
INTRAMUSCULAR | Status: AC
  Administered 2015-02-19: 0.5 mg via INTRAVENOUS
  Filled 2015-02-19: qty 1

## 2015-02-19 MED ORDER — VANCOMYCIN HCL IN DEXTROSE 1-5 GM/200ML-% IV SOLN
1000.0000 mg | Freq: Once | INTRAVENOUS | Status: AC
  Administered 2015-02-19: 1000 mg via INTRAVENOUS

## 2015-02-19 MED ORDER — VANCOMYCIN HCL IN DEXTROSE 1-5 GM/200ML-% IV SOLN
INTRAVENOUS | Status: AC
  Administered 2015-02-19: 1000 mg via INTRAVENOUS
  Filled 2015-02-19: qty 200

## 2015-02-19 NOTE — Plan of Care (Signed)
Problem: Discharge Progression Outcomes Goal: Discharge plan in place and appropriate Individualization Address patient as [name]. Patient receives hemodialysis MWF. Patient is a high falls risk, offer toileting every hour. Patient has history of ESRD, HTN, DM, Gerd, CAD, Collagen Vascular disease, MI, diabetic neuropathy, controlled with home medications.    Goal: Other Discharge Outcomes/Goals Outcome: Progressing Plan of Care Progressing to Goal: Patient received pain medications with noted relief. Nausea med given, patient was able to tolerate diet. Patient on hemodialysis MWF, received dialysis today before admission.

## 2015-02-19 NOTE — Progress Notes (Signed)
ANTIBIOTIC CONSULT NOTE - INITIAL  Pharmacy Consult for Levaquin Indication: HCAP  Allergies  Allergen Reactions  . Ace Inhibitors Swelling  . Codeine Nausea And Vomiting  . Compazine [Prochlorperazine Edisylate] Nausea And Vomiting  . Oxycodone Anxiety  . Tape Rash    Patient Measurements: Height: 5\' 5"  (165.1 cm) Weight: 198 lb (89.812 kg) IBW/kg (Calculated) : 57 Adjusted Body Weight:   Vital Signs: Temp: 97.6 F (36.4 C) (05/30 1607) Temp Source: Oral (05/30 1607) BP: 149/63 mmHg (05/30 1607) Pulse Rate: 80 (05/30 1607) Intake/Output from previous day:   Intake/Output from this shift:    Labs:  Recent Labs  02/17/15 1955 02/19/15 1133  WBC 5.6 5.3  HGB 11.9* 11.4*  PLT 135* 141*  CREATININE 7.72* 4.78*  4.77*   Estimated Creatinine Clearance: 14.7 mL/min (by C-G formula based on Cr of 4.77). No results for input(s): VANCOTROUGH, VANCOPEAK, VANCORANDOM, GENTTROUGH, GENTPEAK, GENTRANDOM, TOBRATROUGH, TOBRAPEAK, TOBRARND, AMIKACINPEAK, AMIKACINTROU, AMIKACIN in the last 72 hours.   Microbiology: No results found for this or any previous visit (from the past 720 hour(s)).  Medical History: Past Medical History  Diagnosis Date  . Asthma   . Collagen vascular disease   . Diabetes mellitus without complication   . Hypertension   . Renal insufficiency dialysis     3 days weekly mon wed friday  . Coronary artery disease     stent  . Diabetic neuropathy   . ESRD (end stage renal disease) on dialysis     M-W-F  . GERD (gastroesophageal reflux disease)   . Hx of pancreatitis 2015  . Myocardial infarction   . Pneumonia     Medications:  Scheduled:  . heparin  5,000 Units Subcutaneous 3 times per day  . [START ON 02/21/2015] levofloxacin (LEVAQUIN) IV  500 mg Intravenous Q48H  . pantoprazole  40 mg Oral BID  . sucralfate  1 g Oral TID WC & HS   Assessment: Pt admitted with HCAP , impaired renal function.  Goal of Therapy:   Plan:  Expected  duration 7 days with resolution of temperature and/or normalization of WBC  Levaquin 750 mg IV X 1 given in ED on 5/31.   Levaquin 500 mg IV Q48H ordered to start 6/1 @ 18:00.   Steen Bisig D 02/19/2015,7:59 PM

## 2015-02-19 NOTE — ED Notes (Signed)
Mario dialysis nurse at bedside to remove needles and tubing form left upper arm.

## 2015-02-19 NOTE — ED Notes (Signed)
Sherita dialysis nurse called and notified of left upper arm dialysis needles to be removed.

## 2015-02-19 NOTE — ED Provider Notes (Signed)
[facility] Emergency Department Provider Note  ____________________________________________  Time seen: Approximately 12:42 PM  I have reviewed the triage vital signs and the nursing notes.   HISTORY  Chief Complaint Shortness of Breath    HPI [name] is a 54 y.o. female with past medical history significant for COPD, end-stage renal disease on dialysis, CAD status post stents and diabetes who presents with shortness of breath ongoing for greater than 1 week. She was here on 02/17/2015 and diagnosed with early pneumonia discharged on Levaquin. She reports her shortness of breath is continued, today was incompletely dialyzed/dialysis was stopped early because she became very short of breath. She also complained of some burning in her chest. She has generally had poor energy. She has had nausea without vomiting, no fevers, no chills. She reports it has been constant since onset. Current severity is moderate to severe. She reports exertion makes her shortness of breath worse.   Past Medical History  Diagnosis Date  . Asthma   . Collagen vascular disease   . Diabetes mellitus without complication   . Hypertension   . Renal insufficiency dialysis     3 days weekly mon wed friday  . Coronary artery disease     stent  . Diabetic neuropathy   . ESRD (end stage renal disease) on dialysis     M-W-F  . GERD (gastroesophageal reflux disease)   . Hx of pancreatitis 2015    There are no active problems to display for this patient.   Past Surgical History  Procedure Laterality Date  . Cholecystectomy    . Appendectomy    . Abdominal hysterectomy      Current Outpatient Rx  Name  Route  Sig  Dispense  Refill  . albuterol (PROVENTIL HFA;VENTOLIN HFA) 108 (90 BASE) MCG/ACT inhaler      Inhale 4-6 puffs by mouth every 4 hours as needed for wheezing, cough, and/or shortness of breath   1 Inhaler   1   . HYDROcodone-acetaminophen (NORCO/VICODIN) 5-325 MG  per tablet   Oral   Take 1 tablet by mouth every 6 (six) hours as needed for moderate pain.   20 tablet   0   . levofloxacin (LEVAQUIN) 500 MG tablet      Take 1 tablet by mouth every 48 hours starting on 02/19/2015   5 tablet   0   . ondansetron (ZOFRAN) 4 MG tablet   Oral   Take 1 tablet (4 mg total) by mouth daily as needed for nausea or vomiting.   20 tablet   1     Allergies Ace inhibitors; Codeine; Oxycodone; and Tape  History reviewed. No pertinent family history.  Social History History  Substance Use Topics  . Smoking status: Current Every Day Smoker -- 0.50 packs/day    Types: Cigarettes  . Smokeless tobacco: Never Used  . Alcohol Use: No    Review of Systems Constitutional: No fever/chills Eyes: No visual changes. ENT: No sore throat. Cardiovascular: + chest pain. Respiratory: + shortness of breath. Gastrointestinal: No abdominal pain.  +nausea, no vomiting.  No diarrhea.  No constipation. Genitourinary: Negative for dysuria. Musculoskeletal: Negative for back pain. Skin: Negative for rash. Neurological: Negative for headaches, focal weakness or numbness.  10-point ROS otherwise negative.  ____________________________________________   PHYSICAL EXAM:  VITAL SIGNS: ED Triage Vitals  Enc Vitals Group     BP 02/19/15 1137 164/71 mmHg     Pulse Rate 02/19/15 1119 79  Resp 02/19/15 1119 16     Temp 02/19/15 1119 98.1 F (36.7 C)     Temp Source 02/19/15 1119 Oral     SpO2 02/19/15 1115 93 %     Weight 02/19/15 1119 198 lb (89.812 kg)     Height 02/19/15 1119 5\' 5"  (1.651 m)     Head Cir --      Peak Flow --      Pain Score 02/19/15 1121 7     Pain Loc --      Pain Edu? --      Excl. in [place]? --     Constitutional: Alert and oriented. Chronically ill- appearing and in no acute distress. Eyes: Conjunctivae are normal. PERRL. EOMI. Head: Atraumatic. Nose: No congestion/rhinnorhea. Mouth/Throat: Mucous membranes are moist.  Oropharynx  non-erythematous. Neck: No stridor.  Cardiovascular: Normal rate, regular rhythm. Grossly normal heart sounds.  Good peripheral circulation. Respiratory: Normal respiratory effort. Crackles at bilateral bases Gastrointestinal: Soft and nontender. No distention. No abdominal bruits. No CVA tenderness. Genitourinary: deferred Musculoskeletal: No lower extremity tenderness nor edema.  No joint effusions. Neurologic:  Normal speech and language. No gross focal neurologic deficits are appreciated. Speech is normal. No gait instability. Skin:  Skin is warm, dry and intact. No rash noted. Psychiatric: Mood and affect are normal. Speech and behavior are normal.  ____________________________________________   LABS (all labs ordered are listed, but only abnormal results are displayed)  Labs Reviewed  CBC - Abnormal; Notable for the following:    Hemoglobin 11.4 (*)    MCHC 31.8 (*)    RDW 19.5 (*)    Platelets 141 (*)    All other components within normal limits  BASIC METABOLIC PANEL - Abnormal; Notable for the following:    Chloride 99 (*)    Creatinine, Ser 4.77 (*)    Calcium 8.3 (*)    GFR calc non Af Amer 9 (*)    GFR calc Af Amer 11 (*)    All other components within normal limits  CULTURE, BLOOD (ROUTINE X 2)  CULTURE, BLOOD (ROUTINE X 2)  TROPONIN I   ____________________________________________  EKG  ED ECG REPORT I, Joanne Gavel, the attending physician, personally viewed and interpreted this ECG.   Date: 02/19/2015  EKG Time: 11:20  Rate: 78  Rhythm: normal sinus rhythm  Axis: Normal  Intervals:none, no conduction abnormality  ST&T Change: Nonspecific T wave abnormality, Q waves in V1, V2  ____________________________________________  RADIOLOGY  CXR AP IMPRESSION: Mild increase in volume of pleural effusions. No active disease.  CXR lateral:  IMPRESSION: Right greater than left pleural effusions with right lower  lobe atelectasis.  ____________________________________________   PROCEDURES  Procedure(s) performed: None  Critical Care performed: No  ____________________________________________   INITIAL IMPRESSION / ASSESSMENT AND PLAN / ED COURSE  Pertinent labs & imaging results that were available during my care of the patient were reviewed by me and considered in my medical decision making (see chart for details).  [name] is a 54 y.o. female with past medical history significant for COPD, end-stage renal disease on dialysis, CAD status post stents and diabetes who presents with shortness of breath ongoing for greater than 1 week. On my initial assessment, she had O2 saturation of 86% which increased to 96% on 3 L of oxygen. She has crackles in bilateral bases. I discussed x-ray with Dr.Krishnan of radiology and given right-sided patchy lower lobe opacity with right-sided effusion, parapneumonic effusion cannot be excluded. In  the setting of cough and hypoxia and recent chest x-ray concerning for early right basilar infiltrate, we'll treat for healthcare associated pneumonia with vancomycin, Zosyn, Levaquin and admit to the hospitalist.  ----------------------------------------- 2:38 PM on 02/19/2015 -----------------------------------------  Discussed with hospitalist for admission. ____________________________________________   FINAL CLINICAL IMPRESSION(S) / ED DIAGNOSES  Final diagnoses:  Hypoxia  HCAP (healthcare-associated pneumonia)      Joanne Gavel, MD 02/19/15 1438

## 2015-02-19 NOTE — H&P (Signed)
[facility] NAME: [name]    MR#:  [id number]  DATE OF BIRTH:  1961-08-29  DATE OF ADMISSION:  02/19/2015  PRIMARY CARE PHYSICIAN: Ellamae Sia, MD   REQUESTING/REFERRING PHYSICIAN: Dr Edd Fabian  CHIEF COMPLAINT:   Chief Complaint  Patient presents with  . Shortness of Breath    HISTORY OF PRESENT ILLNESS:  [name]  is a 54 y.o. female with a known history of end-stage renal disease on hemodialysis, diabetes, coronary artery disease with a history of MI in the past to presents today from dialysis with shortness of breath. She has had shortness of breath for the past week. She actually presented to the emergency room on 5/28 and was diagnosed with pneumonia and started on Levaquin. Today during dialysis she became acutely worse and had to finish her dialysis session 30 minutes early. She has also had nausea with multiple episodes of emesis, no diarrhea. She has not had any fevers chills or abdominal pain. Shortness of breath worse with exertion. No coughing or sputum production. No chest pain. ED evaluation shows right-sided patchy lower lobe opacity with right-sided effusion possibly parapneumonic. She is being admitted for treatment of healthcare associated pneumonia and further evaluation of pleural effusion.  PAST MEDICAL HISTORY:   Past Medical History  Diagnosis Date  . Asthma   . Collagen vascular disease   . Diabetes mellitus without complication   . Hypertension   . Renal insufficiency dialysis     3 days weekly mon wed friday  . Coronary artery disease     stent  . Diabetic neuropathy   . ESRD (end stage renal disease) on dialysis     M-W-F  . GERD (gastroesophageal reflux disease)   . Hx of pancreatitis 2015  . Myocardial infarction   . Pneumonia     PAST SURGICAL HISTORY:   Past Surgical History  Procedure Laterality Date  . Cholecystectomy    . Appendectomy    . Abdominal hysterectomy       SOCIAL HISTORY:   History  Substance Use Topics  . Smoking status: Former Smoker -- 0.50 packs/day    Types: Cigarettes    Quit date: 02/13/2015  . Smokeless tobacco: Never Used  . Alcohol Use: No    FAMILY HISTORY:   Family History  Problem Relation Age of Onset  . Kidney disease Mother   . Diabetes Mother     DRUG ALLERGIES:   Allergies  Allergen Reactions  . Ace Inhibitors Swelling  . Codeine Nausea And Vomiting  . Compazine [Prochlorperazine Edisylate] Nausea And Vomiting  . Oxycodone Anxiety  . Tape Rash    REVIEW OF SYSTEMS:   Review of Systems  Constitutional: Negative for fever, chills, weight loss and malaise/fatigue.  HENT: Negative for congestion and hearing loss.   Eyes: Negative for blurred vision and pain.  Respiratory: Positive for shortness of breath. Negative for cough, hemoptysis, sputum production and stridor.   Cardiovascular: Negative for chest pain, palpitations, orthopnea and leg swelling.  Gastrointestinal: Positive for nausea, vomiting and constipation. Negative for abdominal pain, diarrhea and blood in stool.  Genitourinary: Negative for dysuria and frequency.  Musculoskeletal: Negative for myalgias, back pain, joint pain and neck pain.  Skin: Negative for rash.  Neurological: Positive for weakness. Negative for focal weakness, loss of consciousness and headaches.  Endo/Heme/Allergies: Does not bruise/bleed easily.  Psychiatric/Behavioral: Negative for depression and hallucinations. The patient is not nervous/anxious.  MEDICATIONS AT HOME:   Prior to Admission medications   Medication Sig Start Date End Date Taking? Authorizing Provider  albuterol (PROVENTIL HFA;VENTOLIN HFA) 108 (90 BASE) MCG/ACT inhaler Inhale 4-6 puffs by mouth every 4 hours as needed for wheezing, cough, and/or shortness of breath 02/17/15  Yes Hinda Kehr, MD  amLODipine (NORVASC) 10 MG tablet Take 10 mg by mouth daily.   Yes Historical Provider, MD  aspirin  EC 81 MG tablet Take 81 mg by mouth daily.   Yes Historical Provider, MD  atorvastatin (LIPITOR) 40 MG tablet Take 40 mg by mouth daily.   Yes Historical Provider, MD  buPROPion (WELLBUTRIN SR) 150 MG 12 hr tablet Take 150 mg by mouth 2 (two) times daily.   Yes Historical Provider, MD  carvedilol (COREG) 25 MG tablet Take 25 mg by mouth 2 (two) times daily.   Yes Historical Provider, MD  cetirizine (ZYRTEC) 10 MG tablet Take 10 mg by mouth daily.   Yes Historical Provider, MD  citalopram (CELEXA) 20 MG tablet Take 20 mg by mouth daily.   Yes Historical Provider, MD  clopidogrel (PLAVIX) 75 MG tablet Take 75 mg by mouth daily.   Yes Historical Provider, MD  Ergocalciferol (VITAMIN D2) 400 UNITS TABS Take 2 tablets by mouth daily.    Yes Historical Provider, MD  gabapentin (NEURONTIN) 100 MG capsule Take 100 mg by mouth daily.   Yes Historical Provider, MD  HYDROcodone-acetaminophen (NORCO/VICODIN) 5-325 MG per tablet Take 1 tablet by mouth every 6 (six) hours as needed for moderate pain. 02/08/15  Yes Earleen Newport, MD  hydrocortisone 1 % ointment Apply 1 application topically 2 (two) times daily.   Yes Historical Provider, MD  insulin aspart (NOVOLOG) 100 UNIT/ML injection Inject 2-6 Units into the skin 3 (three) times daily with meals. Pt uses as needed per sliding scale.   Yes Historical Provider, MD  insulin detemir (LEVEMIR) 100 UNIT/ML injection Inject 8 Units into the skin at bedtime.   Yes Historical Provider, MD  lipase/protease/amylase (CREON) 12000 UNITS CPEP capsule Take 24,000-48,000 Units by mouth 5 (five) times daily. Pt takes four capsules with meals and two capsules with snacks.   Yes Historical Provider, MD  metoCLOPramide (REGLAN) 5 MG tablet Take 5 mg by mouth 4 (four) times daily as needed for nausea.   Yes Historical Provider, MD  nitroGLYCERIN (NITROSTAT) 0.4 MG SL tablet Place 0.4 mg under the tongue every 5 (five) minutes as needed for chest pain.   Yes Historical  Provider, MD  Omega-3 Fatty Acids (FISH OIL) 1000 MG CAPS Take 1 capsule by mouth 2 (two) times daily.   Yes Historical Provider, MD  ondansetron (ZOFRAN) 4 MG tablet Take 1 tablet (4 mg total) by mouth daily as needed for nausea or vomiting. 02/08/15  Yes Earleen Newport, MD  ranitidine (ZANTAC) 150 MG tablet Take 150 mg by mouth 2 (two) times daily.   Yes Historical Provider, MD  ranolazine (RANEXA) 1000 MG SR tablet Take 1,000 mg by mouth 2 (two) times daily.    Yes Historical Provider, MD  sevelamer carbonate (RENVELA) 800 MG tablet Take 800 mg by mouth 3 (three) times daily with meals.   Yes Historical Provider, MD  levofloxacin (LEVAQUIN) 500 MG tablet Take 1 tablet by mouth every 48 hours starting on 02/19/2015 02/19/15 03/01/15  Hinda Kehr, MD      VITAL SIGNS:  Blood pressure 149/63, pulse 80, temperature 97.6 F (36.4 C), temperature source Oral, resp. rate 19, height  5\' 5"  (1.651 m), weight 89.812 kg (198 lb), SpO2 98 %.  PHYSICAL EXAMINATION:  GENERAL:  54 y.o.-year-old patient lying in the bed with no acute distress. Weak EYES: Pupils equal, round, reactive to light and accommodation. No scleral icterus. Extraocular muscles intact.  HEENT: Head atraumatic, normocephalic. Oropharynx and nasopharynx clear.  NECK:  Supple, no jugular venous distention. No thyroid enlargement, no tenderness.  LUNGS: Bibasilar crackles, diminished breath sounds on the right, fair air movement no wheezing, coughing, rhonchi or rales. CARDIOVASCULAR: S1, S2 normal. No murmurs, rubs, or gallops.  ABDOMEN: Soft, nontender, nondistended. Bowel sounds present. No organomegaly or mass.  EXTREMITIES: 1 pedal edema, no cyanosis, or clubbing.  NEUROLOGIC: Cranial nerves II through XII are intact. Muscle strength 5/5 in all extremities. Sensation intact. Gait not checked.  PSYCHIATRIC: The patient is alert and oriented x 3.  SKIN: No obvious rash, lesion, or ulcer.   LABORATORY PANEL:   CBC  Recent  Labs Lab 02/19/15 1133  WBC 5.3  HGB 11.4*  HCT 35.8  PLT 141*   ------------------------------------------------------------------------------------------------------------------  Chemistries   Recent Labs Lab 02/17/15 1955 02/19/15 1133  NA 134* 137  K 4.0 3.5  CL 95* 99*  CO2 28 31  GLUCOSE 123* 82  BUN 23* 11  CREATININE 7.72* 4.78*  4.77*  CALCIUM 9.0 8.3*  MG 2.6*  --   AST 20  --   ALT 16  --   ALKPHOS 124  --   BILITOT 0.6  --    ------------------------------------------------------------------------------------------------------------------  Cardiac Enzymes  Recent Labs Lab 02/19/15 1133  TROPONINI <0.03   ------------------------------------------------------------------------------------------------------------------  RADIOLOGY:  Dg Chest 1 View  02/19/2015   CLINICAL DATA:  Shortness of breath, possible pneumonia  EXAM: CHEST  1 VIEW  COMPARISON:  Frontal chest radiograph dated 02/19/2015 at 1155 hr  FINDINGS: Layering pleural effusion, likely right greater than left when correlating with the frontal radiograph.  Patchy lower lobe opacity, likely right lower lobe atelectasis when corresponding with the frontal radiograph.  IMPRESSION: Right greater than left pleural effusions with right lower lobe atelectasis.   Electronically Signed   By: Julian Hy M.D.   On: 02/19/2015 13:34   Dg Chest 2 View  02/17/2015   CLINICAL DATA:  Shortness of Breath  EXAM: CHEST  2 VIEW  COMPARISON:  05/30/2014  FINDINGS: Cardiac shadow it is mildly enlarged but stable. Stenting is noted throughout the left arm venous tree. Elevation of the right hemidiaphragm is again seen. Early right basilar infiltrate is identified predominately within the lower lobe. No other focal abnormality is noted.  IMPRESSION: Early right basilar infiltrate.   Electronically Signed   By: Inez Catalina M.D.   On: 02/17/2015 18:28   Dg Chest Portable 1 View  02/19/2015   CLINICAL DATA:   Dialysis patient.  Shortness of breath.  EXAM: PORTABLE CHEST - 1 VIEW  COMPARISON:  02/17/2015  FINDINGS: Normal heart size. There are small bilateral pleural effusions noted right greater in left. These are mildly increased in volume from previous exam. No airspace consolidation identified. The visualized osseous structures are unremarkable.  IMPRESSION: Mild increase in volume of pleural effusions.  No active disease.   Electronically Signed   By: Kerby Moors M.D.   On: 02/19/2015 12:35    EKG:   Orders placed or performed during the hospital encounter of 02/19/15  . ED EKG  . ED EKG    IMPRESSION AND PLAN:   Active Problems:   Pleural effusion  HCAP (healthcare-associated pneumonia)   End-stage renal disease on hemodialysis  #1 healthcare associated pneumonia: She is a dialysis patient and has also been in the emergency room recently. She has failed treatment with Levaquin. She is now started on broader spectrum anabiotic's with Levaquin, Zosyn, vancomycin. Blood cultures are pending. He does have a right sided pleural effusion which is likely parapneumonic. It is not large enough to cause hypoxia or respiratory distress. Thoracentesis in am.  #2 end-stage renal disease on hemodialysis: She received most of her hemodialysis treatment this morning. Missing only 30 minutes. We'll consult nephrology to follow in case she needs hemodialysis again on Wednesday. Electrolytes are stable  #3 nausea and vomiting: Most likely related to her active infection. Provide Zofran.  #4 coronary artery disease with history of MI: We will cycle cardiac enzymes that she is having ongoing nausea and vomiting with shortness of breath which all may be anginal equivalents. EKG with no significant changes, initial troponin is negative. We'll monitor on telemetry     All the records are reviewed and case discussed with ED provider. Management plans discussed with the patient, family and they are in  agreement.  CODE STATUS: Full  TOTAL TIME TAKING CARE OF THIS PATIENT: 50 minutes.    Myrtis Ser M.D on 02/19/2015 at 5:41 PM  Between 7am to 6pm - Pager - [phone]  After 6pm go to [email or web address] - password [email or web address] [facility]  Office  [phone]  CC: Primary care physician; Ellamae Sia, MD

## 2015-02-19 NOTE — Progress Notes (Signed)
RN consult: remove needles from access arm.  Needles removed. Hemostasis achieved after 7 minutes. Waited 2 extra minutes with patient; no bleeding.

## 2015-02-19 NOTE — ED Notes (Signed)
Discussed high alert for hydro morphine when ordered for pain due to patient allergy.  Dr. Thomasene Lot verbalized to proceed with administration.

## 2015-02-19 NOTE — Progress Notes (Signed)
ANTIBIOTIC CONSULT NOTE - INITIAL  Pharmacy Consult for Levofloxacin  Indication: pneumonia (HCAP)  Allergies  Allergen Reactions  . Ace Inhibitors Swelling  . Codeine Nausea And Vomiting  . Oxycodone Anxiety  . Tape Rash    Patient Measurements: Height: 5\' 5"  (165.1 cm) Weight: 198 lb (89.812 kg) IBW/kg (Calculated) : 57    Vital Signs: Temp: 98.1 F (36.7 C) (05/30 1119) Temp Source: Oral (05/30 1119) BP: 176/75 mmHg (05/30 1330) Pulse Rate: 79 (05/30 1330) Intake/Output from previous day:   Intake/Output from this shift:    Labs:  Recent Labs  02/17/15 1955 02/19/15 1133  WBC 5.6 5.3  HGB 11.9* 11.4*  PLT 135* 141*  CREATININE 7.72* 4.77*   Estimated Creatinine Clearance: 14.7 mL/min (by C-G formula based on Cr of 4.77). No results for input(s): VANCOTROUGH, VANCOPEAK, VANCORANDOM, GENTTROUGH, GENTPEAK, GENTRANDOM, TOBRATROUGH, TOBRAPEAK, TOBRARND, AMIKACINPEAK, AMIKACINTROU, AMIKACIN in the last 72 hours.   Microbiology: No results found for this or any previous visit (from the past 720 hour(s)).  Medical History: Past Medical History  Diagnosis Date  . Asthma   . Collagen vascular disease   . Diabetes mellitus without complication   . Hypertension   . Renal insufficiency dialysis     3 days weekly mon wed friday  . Coronary artery disease     stent  . Diabetic neuropathy   . ESRD (end stage renal disease) on dialysis     M-W-F  . GERD (gastroesophageal reflux disease)   . Hx of pancreatitis 2015     Assessment: 54 yo female on HD MWF starting abx for HCAP  Goal of Therapy:  Resolution of infection  Plan:  Pt currently in the ED, consulted by ED MD for levofloxacin Will order levofloxacin 750 mg IV x1 and follow up admission orders for continuation  Rayna Sexton L 02/19/2015,2:00 PM

## 2015-02-19 NOTE — ED Notes (Signed)
Patient brought in via EMS. Patient in dialysis and was unable to complete treatment due to shortness of breath and epigastric burning.

## 2015-02-19 NOTE — ED Notes (Signed)
Pt brought in via EMS from dialysis with left upper arm cannulated.  Left upper arm stable, free of bleeding, and dressed with kerlex.  Tubing in place with tape and capped.

## 2015-02-20 ENCOUNTER — Inpatient Hospital Stay: Payer: Medicare Other

## 2015-02-20 LAB — BASIC METABOLIC PANEL
Anion gap: 10 (ref 5–15)
BUN: 14 mg/dL (ref 6–20)
CO2: 30 mmol/L (ref 22–32)
Calcium: 8.5 mg/dL — ABNORMAL LOW (ref 8.9–10.3)
Chloride: 96 mmol/L — ABNORMAL LOW (ref 101–111)
Creatinine, Ser: 5.9 mg/dL — ABNORMAL HIGH (ref 0.44–1.00)
GFR calc Af Amer: 8 mL/min — ABNORMAL LOW (ref >60)
GFR calc non Af Amer: 7 mL/min — ABNORMAL LOW (ref >60)
Glucose, Bld: 58 mg/dL — ABNORMAL LOW (ref 65–99)
Potassium: 3.7 mmol/L (ref 3.5–5.1)
Sodium: 136 mmol/L (ref 135–145)

## 2015-02-20 LAB — LACTATE DEHYDROGENASE, PLEURAL OR PERITONEAL FLUID: LD, Fluid: 39 U/L — ABNORMAL HIGH (ref 3–23)

## 2015-02-20 LAB — CBC
HCT: 35.3 % (ref 35.0–47.0)
Hemoglobin: 11.1 g/dL — ABNORMAL LOW (ref 12.0–16.0)
MCH: 27.1 pg (ref 26.0–34.0)
MCHC: 31.4 g/dL — ABNORMAL LOW (ref 32.0–36.0)
MCV: 86.2 fL (ref 80.0–100.0)
Platelets: 130 10*3/uL — ABNORMAL LOW (ref 150–440)
RBC: 4.1 MIL/uL (ref 3.80–5.20)
RDW: 19.5 % — ABNORMAL HIGH (ref 11.5–14.5)
WBC: 4.5 10*3/uL (ref 3.6–11.0)

## 2015-02-20 LAB — PROTEIN, BODY FLUID: Total protein, fluid: <3 g/dL

## 2015-02-20 LAB — HEPATIC FUNCTION PANEL
ALT: 16 U/L (ref 14–54)
AST: 22 U/L (ref 15–41)
Albumin: 3.5 g/dL (ref 3.5–5.0)
Alkaline Phosphatase: 102 U/L (ref 38–126)
Bilirubin, Direct: <0.1 mg/dL — ABNORMAL LOW (ref 0.1–0.5)
Total Bilirubin: 0.4 mg/dL (ref 0.3–1.2)
Total Protein: 6.7 g/dL (ref 6.5–8.1)

## 2015-02-20 LAB — BODY FLUID CELL COUNT WITH DIFFERENTIAL
Lymphs, Fluid: 74 %
Monocyte-Macrophage-Serous Fluid: 2 %
Neutrophil Count, Fluid: 24 %
Total Nucleated Cell Count, Fluid: 526 cu mm

## 2015-02-20 LAB — GLUCOSE, CAPILLARY
Glucose-Capillary: 109 mg/dL — ABNORMAL HIGH (ref 65–99)
Glucose-Capillary: 39 mg/dL — CL (ref 65–99)
Glucose-Capillary: 40 mg/dL — CL (ref 65–99)
Glucose-Capillary: 40 mg/dL — CL (ref 65–99)
Glucose-Capillary: 44 mg/dL — CL (ref 65–99)

## 2015-02-20 LAB — GLUCOSE, SEROUS FLUID: Glucose, Fluid: 46 mg/dL

## 2015-02-20 LAB — LIPASE, BLOOD: Lipase: 20 U/L — ABNORMAL LOW (ref 22–51)

## 2015-02-20 LAB — TROPONIN I: Troponin I: <0.03 ng/mL (ref <0.031)

## 2015-02-20 MED ORDER — METOCLOPRAMIDE HCL 5 MG PO TABS
5.0000 mg | ORAL_TABLET | Freq: Four times a day (QID) | ORAL | Status: DC | PRN
  Filled 2015-02-20: qty 1

## 2015-02-20 MED ORDER — HYDROCODONE-ACETAMINOPHEN 5-325 MG PO TABS
1.0000 | ORAL_TABLET | ORAL | Status: DC | PRN
Start: 2015-02-20 — End: 2015-02-20

## 2015-02-20 MED ORDER — BUPROPION HCL ER (SR) 150 MG PO TB12
150.0000 mg | ORAL_TABLET | Freq: Two times a day (BID) | ORAL | Status: DC
  Administered 2015-02-21 – 2015-02-24 (×7): 150 mg via ORAL
  Filled 2015-02-20 (×8): qty 1

## 2015-02-20 MED ORDER — NITROGLYCERIN 0.4 MG SL SUBL: 0.4000 mg | SUBLINGUAL_TABLET | SUBLINGUAL | Status: DC | PRN

## 2015-02-20 MED ORDER — SEVELAMER CARBONATE 800 MG PO TABS: 800.0000 mg | ORAL_TABLET | Freq: Three times a day (TID) | ORAL | Status: DC

## 2015-02-20 MED ORDER — OMEGA-3-ACID ETHYL ESTERS 1 G PO CAPS
1.0000 g | ORAL_CAPSULE | Freq: Every day | ORAL | Status: DC
  Administered 2015-02-21 – 2015-02-24 (×4): 1 g via ORAL
  Filled 2015-02-20 (×4): qty 1

## 2015-02-20 MED ORDER — MORPHINE SULFATE 2 MG/ML IJ SOLN
2.0000 mg | INTRAMUSCULAR | Status: DC | PRN
  Administered 2015-02-20 (×2): 2 mg via INTRAVENOUS
  Filled 2015-02-20 (×3): qty 1

## 2015-02-20 MED ORDER — SEVELAMER CARBONATE 800 MG PO TABS
800.0000 mg | ORAL_TABLET | Freq: Three times a day (TID) | ORAL | Status: DC
  Administered 2015-02-21 – 2015-02-24 (×6): 800 mg via ORAL
  Filled 2015-02-20 (×9): qty 1

## 2015-02-20 MED ORDER — HYDROCORTISONE 1 % EX OINT
1.0000 "application " | TOPICAL_OINTMENT | Freq: Two times a day (BID) | CUTANEOUS | Status: DC | PRN
  Filled 2015-02-20: qty 28.35

## 2015-02-20 MED ORDER — PROMETHAZINE HCL 25 MG/ML IJ SOLN
12.5000 mg | INTRAMUSCULAR | Status: DC | PRN
  Administered 2015-02-20 – 2015-02-23 (×5): 12.5 mg via INTRAVENOUS
  Filled 2015-02-20 (×5): qty 1

## 2015-02-20 MED ORDER — VITAMIN D3 25 MCG (1000 UNIT) PO TABS
1000.0000 [IU] | ORAL_TABLET | Freq: Every day | ORAL | Status: DC
  Administered 2015-02-21 – 2015-02-24 (×4): 1000 [IU] via ORAL
  Filled 2015-02-20 (×8): qty 1

## 2015-02-20 MED ORDER — ONDANSETRON HCL 4 MG PO TABS: 4.0000 mg | ORAL_TABLET | Freq: Every day | ORAL | Status: DC | PRN

## 2015-02-20 MED ORDER — INSULIN DETEMIR 100 UNIT/ML ~~LOC~~ SOLN
8.0000 [IU] | Freq: Every day | SUBCUTANEOUS | Status: DC
  Filled 2015-02-20 (×2): qty 0.08

## 2015-02-20 MED ORDER — HYDROCODONE-ACETAMINOPHEN 5-325 MG PO TABS
1.0000 | ORAL_TABLET | Freq: Four times a day (QID) | ORAL | Status: DC | PRN
  Administered 2015-02-24: 12:00:00 1 via ORAL
  Filled 2015-02-20: qty 1

## 2015-02-20 MED ORDER — ASPIRIN EC 81 MG PO TBEC
81.0000 mg | DELAYED_RELEASE_TABLET | Freq: Every day | ORAL | Status: DC
  Administered 2015-02-21 – 2015-02-24 (×4): 81 mg via ORAL
  Filled 2015-02-20 (×4): qty 1

## 2015-02-20 MED ORDER — CLOPIDOGREL BISULFATE 75 MG PO TABS
75.0000 mg | ORAL_TABLET | Freq: Every day | ORAL | Status: DC
  Administered 2015-02-21 – 2015-02-24 (×4): 75 mg via ORAL
  Filled 2015-02-20 (×4): qty 1

## 2015-02-20 MED ORDER — RANOLAZINE ER 500 MG PO TB12
1000.0000 mg | ORAL_TABLET | Freq: Two times a day (BID) | ORAL | Status: DC
  Administered 2015-02-21 – 2015-02-22 (×3): 1000 mg via ORAL
  Filled 2015-02-20 (×6): qty 2

## 2015-02-20 MED ORDER — GABAPENTIN 100 MG PO CAPS
100.0000 mg | ORAL_CAPSULE | Freq: Every day | ORAL | Status: DC
  Administered 2015-02-21 – 2015-02-24 (×4): 100 mg via ORAL
  Filled 2015-02-20 (×4): qty 1

## 2015-02-20 MED ORDER — ATORVASTATIN CALCIUM 20 MG PO TABS
40.0000 mg | ORAL_TABLET | Freq: Every day | ORAL | Status: DC
  Administered 2015-02-21 – 2015-02-24 (×4): 40 mg via ORAL
  Filled 2015-02-20 (×4): qty 2

## 2015-02-20 MED ORDER — ALBUTEROL SULFATE (2.5 MG/3ML) 0.083% IN NEBU: 3.0000 mL | INHALATION_SOLUTION | Freq: Four times a day (QID) | RESPIRATORY_TRACT | Status: DC | PRN

## 2015-02-20 MED ORDER — VITAMIN D2 10 MCG (400 UNIT) PO TABS
2.0000 | ORAL_TABLET | Freq: Every day | ORAL | Status: DC
  Filled 2015-02-20: qty 2

## 2015-02-20 MED ORDER — HYDROMORPHONE HCL 1 MG/ML IJ SOLN
1.0000 mg | INTRAMUSCULAR | Status: DC | PRN
  Administered 2015-02-20 – 2015-02-24 (×9): 1 mg via INTRAVENOUS
  Filled 2015-02-20 (×9): qty 1

## 2015-02-20 MED ORDER — PANCRELIPASE (LIP-PROT-AMYL) 12000-38000 UNITS PO CPEP
24000.0000 [IU] | ORAL_CAPSULE | Freq: Every day | ORAL | Status: DC
  Administered 2015-02-21: 18:00:00 48000 [IU] via ORAL
  Administered 2015-02-21: 23:00:00 24000 [IU] via ORAL
  Administered 2015-02-22 (×2): 48000 [IU] via ORAL
  Administered 2015-02-22: 24000 [IU] via ORAL
  Administered 2015-02-23 (×3): 48000 [IU] via ORAL
  Filled 2015-02-20: qty 4
  Filled 2015-02-20: qty 2
  Filled 2015-02-20 (×4): qty 4
  Filled 2015-02-20: qty 3
  Filled 2015-02-20 (×3): qty 4

## 2015-02-20 MED ORDER — ONDANSETRON HCL 4 MG/2ML IJ SOLN
4.0000 mg | INTRAMUSCULAR | Status: DC | PRN
  Administered 2015-02-20 – 2015-02-23 (×9): 4 mg via INTRAVENOUS
  Filled 2015-02-20 (×10): qty 2

## 2015-02-20 MED ORDER — DEXTROSE 50 % IV SOLN
50.0000 mL | Freq: Once | INTRAVENOUS | Status: AC
Start: 2015-02-20 — End: 2015-02-20
  Administered 2015-02-20: 50 mL via INTRAVENOUS
  Filled 2015-02-20: qty 50

## 2015-02-20 MED ORDER — CITALOPRAM HYDROBROMIDE 20 MG PO TABS
20.0000 mg | ORAL_TABLET | Freq: Every day | ORAL | Status: DC
  Administered 2015-02-21 – 2015-02-24 (×4): 20 mg via ORAL
  Filled 2015-02-20 (×4): qty 1

## 2015-02-20 NOTE — Progress Notes (Signed)
Initial Nutrition Assessment  DOCUMENTATION CODES:     INTERVENTION: Medical Food Supplement Therapy: will recommend Nepro shake once diet advanced  NUTRITION DIAGNOSIS:  Inadequate oral intake related to inability to eat as evidenced by NPO status.  GOAL:  Patient will meet greater than or equal to 90% of their needs Goal for diet advancement s/p procedure today  MONITOR:   (Energy Intake, Electrolyte and renal Profile, Glucose Profile, Digestive system, Anthropometrics)  REASON FOR ASSESSMENT:  Malnutrition Screening Tool    ASSESSMENT:  Pt admitted with n/v and abdominal pain, pt with pleural effusion, health care acquired pna. Pt scheduled for thoracentesis today. PMHx: Past Medical History  Diagnosis Date  . Asthma   . Collagen vascular disease   . Diabetes mellitus without complication   . Hypertension   . Renal insufficiency dialysis     3 days weekly mon wed friday  . Coronary artery disease     stent  . Diabetic neuropathy   . ESRD (end stage renal disease) on dialysis     M-W-F  . GERD (gastroesophageal reflux disease)   . Hx of pancreatitis 2015  . Myocardial infarction   . Pneumonia    PO Intake: pt NPO today. Pt reports not eating hardly at all within the past week. Pt reports drinking Nepro/Ensure in the past.   Medications: Protonix, Renvela, Carafate Labs: Glucose Profile: No results for input(s): GLUCAP in the last 72 hours. serum glucose this am 58 Electrolyte and Renal Profile:    Recent Labs Lab 02/17/15 1955 02/19/15 1133 02/20/15 0036  BUN 23* 11 14  CREATININE 7.72* 4.78*  4.77* 5.90*  NA 134* 137 136  K 4.0 3.5 3.7  MG 2.6*  --   --    Protein Profile:   Recent Labs Lab 02/17/15 1955 02/20/15 0035  ALBUMIN 4.0 3.5   Pt reports dry weight of 88kg the past couple of dialysis treatments PTA. Pt reports usual dry weight of 89.5kg previously.  Height:  Ht Readings from Last 1 Encounters:  02/19/15 5\' 5"  (1.651 m)     Weight:  Wt Readings from Last 1 Encounters:  02/19/15 198 lb (89.812 kg)    Ideal Body Weight:  56.8 kg  Wt Readings from Last 10 Encounters:  02/19/15 198 lb (89.812 kg)  02/08/15 198 lb (89.812 kg)    BMI:  Body mass index is 32.95 kg/(m^2).   Nutrition-Focused physical exam completed. Findings are WDL for fat depletion, muscle depletion, and edema.    Estimated Nutritional Needs:  Kcal:  1675-1955kcals, BEE: 1163kcals, TEE: (IF 1.2-1.4)(AF 1.2) using IBW of 56.8kg  Protein:  68-85g protein (1.2-1.5g/kg) using IBW of 56.8kg  Fluid:  UOP+1045mL  Skin:  Reviewed, no issues  Diet Order:  Diet NPO time specified  EDUCATION NEEDS:  No education needs identified at this time   Intake/Output Summary (Last 24 hours) at 02/20/15 1334 Last data filed at 02/20/15 0938  Gross per 24 hour  Intake      0 ml  Output      0 ml  Net      0 ml    Last BM:  5/30  MODERATE Care Level  Dwyane Luo, RD, LDN Pager [phone]

## 2015-02-20 NOTE — Progress Notes (Signed)
DC zofran every 6 hrs,and morphine

## 2015-02-20 NOTE — Progress Notes (Signed)
Inpatient Diabetes Program Recommendations  AACE/ADA: New Consensus Statement on Inpatient Glycemic Control (2013)  Target Ranges:  Prepandial:   less than 140 mg/dL      Peak postprandial:   less than 180 mg/dL (1-2 hours)      Critically ill patients:  140 - 180 mg/dL   Reason for Assessment:  Results for SHELVA, REAM (MRN [id number]) as of 02/20/2015 08:58  Ref. Range 02/19/2015 11:33 02/20/2015 00:36  Glucose Latest Ref Range: 65-99 mg/dL 82 58 (L)    Diabetes history: Diabetes Outpatient Diabetes medications: Levemir 8 units daily, Novolog tid with meals Current orders for Inpatient glycemic control:  None  Please consider checking CBG's tid with meals and HS.  If greater than 150 mg/dL, may consider Novolog sensitive correction.   Thanks, Adah Perl, RN, BC-ADM Inpatient Diabetes Coordinator Pager [phone] (8a-5p)

## 2015-02-20 NOTE — Progress Notes (Signed)
Blood sugar 109 after D 50% given. Pt continues to be asymptomatic other than headache and continued n/v.  Will continue to assess.   Hiram Gash [employer]

## 2015-02-20 NOTE — Progress Notes (Signed)
On call MD notified of no order for blood sugar checks with pt having scheduled Levemir at bedtime. New orders received.   [name] [employer]

## 2015-02-20 NOTE — Plan of Care (Signed)
Problem: Discharge Progression Outcomes Goal: Discharge plan in place and appropriate Individualization  Outcome: Progressing Discharge plan in place and appropriate Individualization Address patient as [name]. Patient receives hemodialysis MWF. Patient is a high falls risk, offer toileting every hour. Patient has history of ESRD, HTN, DM, Gerd, CAD, Collagen Vascular disease, MI, diabetic neuropathy, controlled with home medications. Goal: Other Discharge Outcomes/Goals Outcome: Progressing Pt alert and oriented, up to br with standby assist. Pt does complain of nausea and pain throughout the night, prn pain medications given with some relief. Will continue to monitor.

## 2015-02-20 NOTE — Progress Notes (Signed)
[facility]  ROUNDING NOTE   Subjective:  Patient well known to [place]. Presents now with healthcare associated pneumonia. Had completed most of her HD treatment yesterday when she became short of breath.     Objective:  Vital signs in last 24 hours:  Temp:  [97.6 F (36.4 C)-98.1 F (36.7 C)] 98.1 F (36.7 C) (05/31 0541) Pulse Rate:  [77-84] 84 (05/31 0541) Resp:  [12-19] 18 (05/31 0541) BP: (149-176)/(60-85) 173/67 mmHg (05/31 0541) SpO2:  [86 %-100 %] 100 % (05/31 0541) Weight:  [89.812 kg (198 lb)] 89.812 kg (198 lb) (05/30 1119)  Weight change:  Filed Weights   02/19/15 1119  Weight: 89.812 kg (198 lb)    Intake/Output:     Intake/Output this shift:     Physical Exam: General: NAD  Head: Normocephalic, atraumatic. Moist oral mucosal membranes  Eyes: Anicteric  Neck: Supple, trachea midline  Lungs:  Diminished BS at bases, normal effort  Heart: Regular rate and rhythm S1S2  Abdomen:  Soft, nontender,   Extremities:  1+ peripheral edema.  Neurologic: Nonfocal, moving all four extremities  Skin: No lesions  Access: LUE AV access    Basic Metabolic Panel:  Recent Labs Lab 02/17/15 1955 02/19/15 1133 02/20/15 0036  NA 134* 137 136  K 4.0 3.5 3.7  CL 95* 99* 96*  CO2 28 31 30   GLUCOSE 123* 82 58*  BUN 23* 11 14  CREATININE 7.72* 4.78*  4.77* 5.90*  CALCIUM 9.0 8.3* 8.5*  MG 2.6*  --   --     Liver Function Tests:  Recent Labs Lab 02/17/15 1955  AST 20  ALT 16  ALKPHOS 124  BILITOT 0.6  PROT 7.4  ALBUMIN 4.0    Recent Labs Lab 02/17/15 1955  LIPASE 28   No results for input(s): AMMONIA in the last 168 hours.  CBC:  Recent Labs Lab 02/17/15 1955 02/19/15 1133 02/20/15 0036  WBC 5.6 5.3 4.5  NEUTROABS 2.9  --   --   HGB 11.9* 11.4* 11.1*  HCT 37.2 35.8 35.3  MCV 85.8 85.4 86.2  PLT 135* 141* 130*    Cardiac Enzymes:  Recent Labs Lab 02/19/15 1133 02/19/15 1814 02/20/15 0034  TROPONINI <0.03 <0.03 <0.03     BNP: Invalid input(s): POCBNP  CBG: No results for input(s): GLUCAP in the last 168 hours.  Microbiology: Results for orders placed or performed during the hospital encounter of 02/19/15  Blood culture (routine x 2)     Status: None (Preliminary result)   Collection Time: 02/19/15 12:20 PM  Result Value Ref Range Status   Specimen Description BLOOD  Final   Special Requests Immunocompromised  Final   Culture NO GROWTH < 24 HOURS  Final   Report Status PENDING  Incomplete  Blood culture (routine x 2)     Status: None (Preliminary result)   Collection Time: 02/19/15 12:30 PM  Result Value Ref Range Status   Specimen Description BLOOD  Final   Special Requests Immunocompromised  Final   Culture NO GROWTH < 24 HOURS  Final   Report Status PENDING  Incomplete    Coagulation Studies: No results for input(s): LABPROT, INR in the last 72 hours.  Urinalysis: No results for input(s): COLORURINE, LABSPEC, PHURINE, GLUCOSEU, HGBUR, BILIRUBINUR, KETONESUR, PROTEINUR, UROBILINOGEN, NITRITE, LEUKOCYTESUR in the last 72 hours.  Invalid input(s): APPERANCEUR    Imaging: Dg Chest 1 View  02/19/2015   CLINICAL DATA:  Shortness of breath, possible pneumonia  EXAM: CHEST  1 VIEW  COMPARISON:  Frontal chest radiograph dated 02/19/2015 at 1155 hr  FINDINGS: Layering pleural effusion, likely right greater than left when correlating with the frontal radiograph.  Patchy lower lobe opacity, likely right lower lobe atelectasis when corresponding with the frontal radiograph.  IMPRESSION: Right greater than left pleural effusions with right lower lobe atelectasis.   Electronically Signed   By: Julian Hy M.D.   On: 02/19/2015 13:34   Dg Chest Portable 1 View  02/19/2015   CLINICAL DATA:  Dialysis patient.  Shortness of breath.  EXAM: PORTABLE CHEST - 1 VIEW  COMPARISON:  02/17/2015  FINDINGS: Normal heart size. There are small bilateral pleural effusions noted right greater in left. These are  mildly increased in volume from previous exam. No airspace consolidation identified. The visualized osseous structures are unremarkable.  IMPRESSION: Mild increase in volume of pleural effusions.  No active disease.   Electronically Signed   By: Kerby Moors M.D.   On: 02/19/2015 12:35     Medications:     . heparin  5,000 Units Subcutaneous 3 times per day  . [START ON 02/21/2015] levofloxacin (LEVAQUIN) IV  500 mg Intravenous Q48H  . pantoprazole  40 mg Oral BID  . sucralfate  1 g Oral TID WC & HS   acetaminophen **OR** acetaminophen, HYDROcodone-acetaminophen, morphine injection, ondansetron **OR** ondansetron (ZOFRAN) IV, polyethylene glycol  Assessment/ Plan:  54 yo AAF with h/o ESRD, DM (30 yrs with retinopathy, neuropathy), OA, IBS, hyperlipidemia, h/o GI bleed, SHPTH, AOCD, diastolic heart failure , RCA stent Feb 2013, C. diff colitis 7/14, clotted RUE AVF 10/14, admission for shortness of breath 02/20/15.  1.  ESRD on HD MWF:  Pt had partial HD treatment yesterday, only missed 30 minutes, therefore no urgent indication for HD today.  Will plan for HD again tomorrow. 2.  Shortness of breath: bilateral pleural effusions noted, also being treated for pneumonia with levaquin.  Thoracentesis being considered. 3.  Anemia fo CKD: Hgb 11.1, hold epogen for now. 4.  SHPTH of renal origin:  Check phos with HD tomorrow, restart renvela with meals.   LOS: 1 Isley Zinni 5/31/201610:25 AM

## 2015-02-20 NOTE — Progress Notes (Addendum)
[facility] NAME: [name]    MR#:  [id number]  DATE OF BIRTH:  Mar 11, 1961  SUBJECTIVE:  CHIEF COMPLAINT:   Chief Complaint  Patient presents with  . Shortness of Breath   Does not feel well today she's complaining of right abdominal discomfort and epigastric  Discomfort, some chest discomfort on this on the right side and some shortness of breath.  Review of Systems  Constitutional: Negative for fever and chills.  Respiratory: Positive for cough and shortness of breath. Negative for hemoptysis, sputum production and wheezing.   Cardiovascular: Positive for chest pain. Negative for palpitations and orthopnea.  Gastrointestinal: Positive for nausea and abdominal pain. Negative for vomiting, diarrhea and constipation.    VITAL SIGNS: Blood pressure 173/67, pulse 84, temperature 98.1 F (36.7 C), temperature source Oral, resp. rate 18, height 5\' 5"  (1.651 m), weight 89.812 kg (198 lb), SpO2 100 %.  PHYSICAL EXAMINATION:   GENERAL:  54 y.o.-year-old patient lying in the bed with no acute distress.  EYES: Pupils equal, round, reactive to light and accommodation. No scleral icterus. Extraocular muscles intact.  HEENT: Head atraumatic, normocephalic. Oropharynx and nasopharynx clear.  NECK:  Supple, no jugular venous distention. No thyroid enlargement, no tenderness.  LUNGS: Normal breath sounds bilaterally, no wheezing, rales,rhonchi or crepitation. No use of accessory muscles of respiration. Some diminished breath sounds on the right was tender percussion right lung posteriorly. Discomfort  in the right upper quadrant on palpation, some guarding CARDIOVASCULAR: S1, S2 normal. No murmurs, rubs, or gallops.  ABDOMEN: Soft, tender on the right, especially on palpation, also epigastric tenderness on palpation,  nondistended. Bowel sounds present. No organomegaly or mass.  EXTREMITIES: No pedal edema, cyanosis, or clubbing.   NEUROLOGIC: Cranial nerves II through XII are intact. Muscle strength 5/5 in all extremities. Sensation intact. Gait not checked.  PSYCHIATRIC: The patient is alert and oriented x 3.  SKIN: No obvious rash, lesion, or ulcer.   ORDERS/RESULTS REVIEWED:   CBC  Recent Labs Lab 02/17/15 1955 02/19/15 1133 02/20/15 0036  WBC 5.6 5.3 4.5  HGB 11.9* 11.4* 11.1*  HCT 37.2 35.8 35.3  PLT 135* 141* 130*  MCV 85.8 85.4 86.2  MCH 27.5 27.2 27.1  MCHC 32.0 31.8* 31.4*  RDW 19.2* 19.5* 19.5*  LYMPHSABS 1.8  --   --   MONOABS 0.5  --   --   EOSABS 0.3  --   --   BASOSABS 0.1  --   --    ------------------------------------------------------------------------------------------------------------------  Chemistries   Recent Labs Lab 02/17/15 1955 02/19/15 1133 02/20/15 0036  NA 134* 137 136  K 4.0 3.5 3.7  CL 95* 99* 96*  CO2 28 31 30   GLUCOSE 123* 82 58*  BUN 23* 11 14  CREATININE 7.72* 4.78*  4.77* 5.90*  CALCIUM 9.0 8.3* 8.5*  MG 2.6*  --   --   AST 20  --   --   ALT 16  --   --   ALKPHOS 124  --   --   BILITOT 0.6  --   --    ------------------------------------------------------------------------------------------------------------------ estimated creatinine clearance is 11.9 mL/min (by C-G formula based on Cr of 5.9). ------------------------------------------------------------------------------------------------------------------ No results for input(s): TSH, T4TOTAL, T3FREE, THYROIDAB in the last 72 hours.  Invalid input(s): FREET3  Cardiac Enzymes  Recent Labs Lab 02/19/15 1133 02/19/15 1814 02/20/15 0034  TROPONINI <0.03 <0.03 <0.03   ------------------------------------------------------------------------------------------------------------------ Invalid input(s): POCBNP ---------------------------------------------------------------------------------------------------------------  RADIOLOGY: Dg  Chest 1 View  02/19/2015   CLINICAL DATA:  Shortness of  breath, possible pneumonia  EXAM: CHEST  1 VIEW  COMPARISON:  Frontal chest radiograph dated 02/19/2015 at 1155 hr  FINDINGS: Layering pleural effusion, likely right greater than left when correlating with the frontal radiograph.  Patchy lower lobe opacity, likely right lower lobe atelectasis when corresponding with the frontal radiograph.  IMPRESSION: Right greater than left pleural effusions with right lower lobe atelectasis.   Electronically Signed   By: Julian Hy M.D.   On: 02/19/2015 13:34   Dg Chest Portable 1 View  02/19/2015   CLINICAL DATA:  Dialysis patient.  Shortness of breath.  EXAM: PORTABLE CHEST - 1 VIEW  COMPARISON:  02/17/2015  FINDINGS: Normal heart size. There are small bilateral pleural effusions noted right greater in left. These are mildly increased in volume from previous exam. No airspace consolidation identified. The visualized osseous structures are unremarkable.  IMPRESSION: Mild increase in volume of pleural effusions.  No active disease.   Electronically Signed   By: Kerby Moors M.D.   On: 02/19/2015 12:35    EKG:  Orders placed or performed during the hospital encounter of 02/19/15  . ED EKG  . ED EKG    ASSESSMENT AND PLAN:  Active Problems:   Pleural effusion   HCAP (healthcare-associated pneumonia)   End-stage renal disease on hemodialysis  Pleural effusion  HCAP (healthcare-associated pneumonia)  End-stage renal disease on hemodialysis  #1 healthcare associated pneumonia: She is a dialysis patient and has also been in the emergency room recently. She has however not tried treatment with Levaquin as outpatient. . Continue Levaquin for now, initiate Zosyn, vancomycin if it fails or if patient has fevers despite levaquin. Blood cultures are still pending. Patient does have a right sided pleural effusion which is suspected parapneumonic. It is not large enough to cause hypoxia or respiratory distress. Ordering Thoracentesis today. The patient will be  nothing by mouth. Getting body fluids for cultures, white blood cell count and differential, also cytology. Continue anti-biotics and make decisions about surgical consultation if needed  #2 end-stage renal disease on hemodialysis: She received most of her hemodialysis treatment on the morning of admission . Missing only 30 minutes. Getting nephrology consult  to follow in case she needs hemodialysis again on Wednesday. Electrolytes are stable for now. The concern is that patient's pleural effusions could be related to an stage renal disease and fluid retention.  #3 nausea and vomiting: Most likely related to her active infection. Provide Zofran. Feeling somewhat better today. Complaints of right-sided abdominal pains, which could be related to pleural effusion, although other causes to be determined. Get lipase level as well as liver enzyme testing, although patient is status post cholecystectomy per her recollection.   #4 coronary artery disease with history of MI: We will cycle cardiac enzymes that she is having ongoing nausea and vomiting with shortness of breath which all may be anginal equivalents. EKG with no significant changes, initial troponin is negative. We'll monitor on telemetry  Management plans discussed with the patient and she is in agreement.   DRUG ALLERGIES:  Allergies  Allergen Reactions  . Ace Inhibitors Swelling  . Codeine Nausea And Vomiting  . Compazine [Prochlorperazine Edisylate] Nausea And Vomiting  . Oxycodone Anxiety  . Tape Rash    CODE STATUS:     Code Status Orders        Start     Ordered   02/19/15 1615  Full code  Continuous     02/19/15 1614      TOTAL TIME TAKING CARE OF THIS PATIENT: 40  minutes.    Theodoro Grist M.D on 02/20/2015 at 8:16 AM  Between 7am to 6pm - Pager - [phone]  After 6pm go to [email or web address] - password [email or web address] [facility]  Office  [phone]  CC: Primary care physician; Ellamae Sia, MD

## 2015-02-20 NOTE — Plan of Care (Signed)
Problem: Discharge Progression Outcomes Goal: Discharge plan in place and appropriate Individualization  Outcome: Progressing Address patient as [name]. Patient receives hemodialysis MWF. Patient is a high falls risk, offer toileting every hour. Patient has history of ESRD, HTN, DM, Gerd, CAD, Collagen Vascular disease, MI, diabetic neuropathy, controlled with home medications. Goal: Other Discharge Outcomes/Goals Outcome: Progressing Plan of Care Progressing to Goal: Pain and nausea medications given throughout day with noted relief. Thoracentesis performed, removed 850 cc fluid. Telemetry monitored, NS heart rate 86. Patient remains on 3L oxygen.

## 2015-02-20 NOTE — Care Management (Signed)
Admitted to [name] with the diagnosis of pleural effusion. Lives with family. [name]. [name] [phone]). Sees Dr Quay Burow at [facility]. Last seen the 5th of May. Established dialysis patient at [facility] on [address] x 10 years. No home health. No skilled facility. No home oxygen. Still drives. Either daughter or son will transport when discharged. Shelbie Ammons RN MSN Care Management [phone]

## 2015-02-20 NOTE — Procedures (Signed)
Right thoracentesis 850 cc clear fluid No comp

## 2015-02-20 NOTE — Progress Notes (Signed)
Dr Ether Griffins ordered 1 mg dilaudid 1 mg IV Q4 hrs prn, zofran 4 mg IV Q4 hr.prn, phenergan 12.5 mg IV Q4 prn, renal/hemodialysis diet

## 2015-02-20 NOTE — Progress Notes (Signed)
On Call MD paged due to pt blood sugar 40. Apple juice given but pt is very nauseous and unable to keep food down. New orders received.    Hiram Gash [employer]

## 2015-02-21 ENCOUNTER — Inpatient Hospital Stay: Payer: Medicare Other

## 2015-02-21 ENCOUNTER — Other Ambulatory Visit: Payer: Self-pay

## 2015-02-21 LAB — CBC
HCT: 36.8 % (ref 35.0–47.0)
Hemoglobin: 11.8 g/dL — ABNORMAL LOW (ref 12.0–16.0)
MCH: 27.8 pg (ref 26.0–34.0)
MCHC: 32.2 g/dL (ref 32.0–36.0)
MCV: 86.4 fL (ref 80.0–100.0)
Platelets: 141 10*3/uL — ABNORMAL LOW (ref 150–440)
RBC: 4.26 MIL/uL (ref 3.80–5.20)
RDW: 20.2 % — ABNORMAL HIGH (ref 11.5–14.5)
WBC: 7.1 10*3/uL (ref 3.6–11.0)

## 2015-02-21 LAB — GLUCOSE, CAPILLARY
Glucose-Capillary: 45 mg/dL — ABNORMAL LOW (ref 65–99)
Glucose-Capillary: 45 mg/dL — ABNORMAL LOW (ref 65–99)
Glucose-Capillary: 53 mg/dL — ABNORMAL LOW (ref 65–99)
Glucose-Capillary: 67 mg/dL (ref 65–99)
Glucose-Capillary: 144 mg/dL — ABNORMAL HIGH (ref 65–99)

## 2015-02-21 LAB — CHOLESTEROL, TOTAL: Cholesterol: 123 mg/dL (ref 0–200)

## 2015-02-21 LAB — RENAL FUNCTION PANEL
Albumin: 3.3 g/dL — ABNORMAL LOW (ref 3.5–5.0)
Anion gap: 14 (ref 5–15)
BUN: 18 mg/dL (ref 6–20)
CO2: 25 mmol/L (ref 22–32)
Calcium: 8.8 mg/dL — ABNORMAL LOW (ref 8.9–10.3)
Chloride: 98 mmol/L — ABNORMAL LOW (ref 101–111)
Creatinine, Ser: 7.97 mg/dL — ABNORMAL HIGH (ref 0.44–1.00)
GFR calc Af Amer: 6 mL/min — ABNORMAL LOW (ref >60)
GFR calc non Af Amer: 5 mL/min — ABNORMAL LOW (ref >60)
Glucose, Bld: 41 mg/dL — CL (ref 65–99)
Phosphorus: 3.5 mg/dL (ref 2.5–4.6)
Potassium: 4.8 mmol/L (ref 3.5–5.1)
Sodium: 137 mmol/L (ref 135–145)

## 2015-02-21 LAB — AMYLASE: Amylase: 23 U/L — ABNORMAL LOW (ref 28–100)

## 2015-02-21 LAB — PATHOLOGIST SMEAR REVIEW

## 2015-02-21 LAB — TRIGLYCERIDES: Triglycerides: 72 mg/dL (ref <150)

## 2015-02-21 LAB — PHOSPHORUS: Phosphorus: 3.1 mg/dL (ref 2.5–4.6)

## 2015-02-21 MED ORDER — LIDOCAINE HCL (PF) 1 % IJ SOLN
5.0000 mL | INTRAMUSCULAR | Status: DC | PRN
  Administered 2015-02-21: 5 mL via INTRADERMAL
  Administered 2015-02-23: 2 mL via INTRADERMAL
  Filled 2015-02-21 (×3): qty 5

## 2015-02-21 MED ORDER — SODIUM CHLORIDE 0.9 % IV SOLN: 100.0000 mL | INTRAVENOUS | Status: DC | PRN

## 2015-02-21 MED ORDER — HYDRALAZINE HCL 20 MG/ML IJ SOLN
10.0000 mg | Freq: Once | INTRAMUSCULAR | Status: AC
  Administered 2015-02-21: 06:00:00 10 mg via INTRAVENOUS
  Filled 2015-02-21: qty 1

## 2015-02-21 MED ORDER — HEPARIN SODIUM (PORCINE) 1000 UNIT/ML DIALYSIS
1000.0000 [IU] | INTRAMUSCULAR | Status: DC | PRN
  Filled 2015-02-21: qty 1

## 2015-02-21 MED ORDER — ALTEPLASE 2 MG IJ SOLR
2.0000 mg | Freq: Once | INTRAMUSCULAR | Status: AC | PRN
  Filled 2015-02-21: qty 2

## 2015-02-21 MED ORDER — NEPRO/CARBSTEADY PO LIQD: 237.0000 mL | ORAL | Status: DC | PRN

## 2015-02-21 MED ORDER — NEPRO/CARBSTEADY PO LIQD
237.0000 mL | Freq: Every day | ORAL | Status: DC
  Administered 2015-02-21 – 2015-02-23 (×3): 237 mL via ORAL

## 2015-02-21 MED ORDER — PENTAFLUOROPROP-TETRAFLUOROETH EX AERO
1.0000 | INHALATION_SPRAY | CUTANEOUS | Status: DC | PRN
Start: 2015-02-21 — End: 2015-02-24
  Filled 2015-02-21: qty 30

## 2015-02-21 MED ORDER — CARVEDILOL 25 MG PO TABS
25.0000 mg | ORAL_TABLET | Freq: Two times a day (BID) | ORAL | Status: DC
  Administered 2015-02-21 – 2015-02-24 (×7): 25 mg via ORAL
  Filled 2015-02-21 (×10): qty 1

## 2015-02-21 MED ORDER — LIDOCAINE-PRILOCAINE 2.5-2.5 % EX CREA
1.0000 "application " | TOPICAL_CREAM | CUTANEOUS | Status: DC | PRN
  Filled 2015-02-21: qty 5

## 2015-02-21 MED ORDER — AMLODIPINE BESYLATE 10 MG PO TABS
10.0000 mg | ORAL_TABLET | Freq: Every day | ORAL | Status: DC
Start: 2015-02-21 — End: 2015-02-24
  Administered 2015-02-21 – 2015-02-24 (×4): 10 mg via ORAL
  Filled 2015-02-21 (×4): qty 1

## 2015-02-21 NOTE — Progress Notes (Signed)
Patient tolerated tx without complications. 559ml fluid removal. 86.4kg post weight

## 2015-02-21 NOTE — Consult Note (Signed)
Pulmonary Critical Care  Initial Consult Note   [name] [id number] DOB: [birth year] DOA: 02/19/2015  Referring physician: Myrtis Ser, MD PCP: Ellamae Sia, MD   Chief Complaint: Pleural Effusion  HPI: [name] is a 54 y.o. female with history of ESRD on HD DM CAD, presented with shortness of breath. Patient states taht she went to the ED on Saturday because of shortness of breath and also some cough. She had a CXR done and was given antibiotics for possible pneumonia. She states taht she never actually started her levaquin as she got worse and came back to be admitted. On her CXR there was noted increased pleural fluid and alveolar filling process. She has had some cough noted no fevers noted. She denies having any chest pain. She has had a little abdominal pain noted. No nausea vomiting or diarrhea noted   Review of Systems:    12 point ROS performed and is unremarkable other than noted in HPI  Past Medical History  Diagnosis Date  . Asthma   . Collagen vascular disease   . Diabetes mellitus without complication   . Hypertension   . Renal insufficiency dialysis     3 days weekly mon wed friday  . Coronary artery disease     stent  . Diabetic neuropathy   . ESRD (end stage renal disease) on dialysis     M-W-F  . GERD (gastroesophageal reflux disease)   . Hx of pancreatitis 2015  . Myocardial infarction   . Pneumonia    Past Surgical History  Procedure Laterality Date  . Cholecystectomy    . Appendectomy    . Abdominal hysterectomy     Social History:  reports that she quit smoking 8 days ago. Her smoking use included Cigarettes. She smoked 0.50 packs per day. She has never used smokeless tobacco. She reports that she does not drink alcohol or use illicit drugs.  Allergies  Allergen Reactions  . Ace Inhibitors Swelling  . Codeine Nausea And Vomiting  . Compazine [Prochlorperazine Edisylate] Nausea And Vomiting  . Oxycodone Anxiety  . Tape Rash     Family History  Problem Relation Age of Onset  . Kidney disease Mother   . Diabetes Mother     Prior to Admission medications   Medication Sig Start Date End Date Taking? Authorizing Provider  albuterol (PROVENTIL HFA;VENTOLIN HFA) 108 (90 BASE) MCG/ACT inhaler Inhale 4-6 puffs by mouth every 4 hours as needed for wheezing, cough, and/or shortness of breath 02/17/15  Yes Hinda Kehr, MD  amLODipine (NORVASC) 10 MG tablet Take 10 mg by mouth daily.   Yes Historical Provider, MD  aspirin EC 81 MG tablet Take 81 mg by mouth daily.   Yes Historical Provider, MD  atorvastatin (LIPITOR) 40 MG tablet Take 40 mg by mouth daily.   Yes Historical Provider, MD  buPROPion (WELLBUTRIN SR) 150 MG 12 hr tablet Take 150 mg by mouth 2 (two) times daily.   Yes Historical Provider, MD  carvedilol (COREG) 25 MG tablet Take 25 mg by mouth 2 (two) times daily.   Yes Historical Provider, MD  cetirizine (ZYRTEC) 10 MG tablet Take 10 mg by mouth daily.   Yes Historical Provider, MD  citalopram (CELEXA) 20 MG tablet Take 20 mg by mouth daily.   Yes Historical Provider, MD  clopidogrel (PLAVIX) 75 MG tablet Take 75 mg by mouth daily.   Yes Historical Provider, MD  Ergocalciferol (VITAMIN D2) 400 UNITS TABS Take 2 tablets by  mouth daily.    Yes Historical Provider, MD  gabapentin (NEURONTIN) 100 MG capsule Take 100 mg by mouth daily.   Yes Historical Provider, MD  HYDROcodone-acetaminophen (NORCO/VICODIN) 5-325 MG per tablet Take 1 tablet by mouth every 6 (six) hours as needed for moderate pain. 02/08/15  Yes Earleen Newport, MD  hydrocortisone 1 % ointment Apply 1 application topically 2 (two) times daily.   Yes Historical Provider, MD  insulin aspart (NOVOLOG) 100 UNIT/ML injection Inject 2-6 Units into the skin 3 (three) times daily with meals. Pt uses as needed per sliding scale.   Yes Historical Provider, MD  insulin detemir (LEVEMIR) 100 UNIT/ML injection Inject 8 Units into the skin at bedtime.   Yes  Historical Provider, MD  lipase/protease/amylase (CREON) 12000 UNITS CPEP capsule Take 24,000-48,000 Units by mouth 5 (five) times daily. Pt takes four capsules with meals and two capsules with snacks.   Yes Historical Provider, MD  metoCLOPramide (REGLAN) 5 MG tablet Take 5 mg by mouth 4 (four) times daily as needed for nausea.   Yes Historical Provider, MD  nitroGLYCERIN (NITROSTAT) 0.4 MG SL tablet Place 0.4 mg under the tongue every 5 (five) minutes as needed for chest pain.   Yes Historical Provider, MD  Omega-3 Fatty Acids (FISH OIL) 1000 MG CAPS Take 1 capsule by mouth 2 (two) times daily.   Yes Historical Provider, MD  ondansetron (ZOFRAN) 4 MG tablet Take 1 tablet (4 mg total) by mouth daily as needed for nausea or vomiting. 02/08/15  Yes Earleen Newport, MD  ranitidine (ZANTAC) 150 MG tablet Take 150 mg by mouth 2 (two) times daily.   Yes Historical Provider, MD  ranolazine (RANEXA) 1000 MG SR tablet Take 1,000 mg by mouth 2 (two) times daily.    Yes Historical Provider, MD  sevelamer carbonate (RENVELA) 800 MG tablet Take 800 mg by mouth 3 (three) times daily with meals.   Yes Historical Provider, MD  levofloxacin (LEVAQUIN) 500 MG tablet Take 1 tablet by mouth every 48 hours starting on 02/19/2015 02/19/15 03/01/15  Hinda Kehr, MD   Physical Exam: Filed Vitals:   02/21/15 1130 02/21/15 1200 02/21/15 1230 02/21/15 1300  BP: 171/80 171/67 167/63 171/65  Pulse: 82 86 86 89  Temp:      TempSrc:      Resp: 22 19 17 17   Height:      Weight:      SpO2:        Wt Readings from Last 3 Encounters:  02/21/15 89.8 kg (197 lb 15.6 oz)  02/08/15 89.812 kg (198 lb)    General:  Appears calm and comfortable on dialysis Eyes: PERRL, normal lids, irises & conjunctiva ENT: grossly normal hearing, lips & tongue Neck: no LAD, masses or thyromegaly Cardiovascular: RRR, no m/r/g. No LE edema. Respiratory: Diminished at bases c/w fluid few ronchi Abdomen: soft, nontender Skin: no rash or  induration seen Musculoskeletal: grossly normal tone BUE/BLE Psychiatric: grossly normal mood and affect Neurologic: grossly non-focal.          Labs on Admission:  Basic Metabolic Panel:  Recent Labs Lab 02/17/15 1955 02/19/15 1133 02/20/15 0036 02/21/15 0707 02/21/15 1152  NA 134* 137 136 137  --   K 4.0 3.5 3.7 4.8  --   CL 95* 99* 96* 98*  --   CO2 28 31 30 25   --   GLUCOSE 123* 82 58* 41*  --   BUN 23* 11 14 18   --   CREATININE  7.72* 4.78*  4.77* 5.90* 7.97*  --   CALCIUM 9.0 8.3* 8.5* 8.8*  --   MG 2.6*  --   --   --   --   PHOS  --   --   --  3.5 3.1   Liver Function Tests:  Recent Labs Lab 02/17/15 1955 02/20/15 0035 02/21/15 0707  AST 20 22  --   ALT 16 16  --   ALKPHOS 124 102  --   BILITOT 0.6 0.4  --   PROT 7.4 6.7  --   ALBUMIN 4.0 3.5 3.3*    Recent Labs Lab 02/17/15 1955 02/20/15 0035  LIPASE 28 20*   No results for input(s): AMMONIA in the last 168 hours. CBC:  Recent Labs Lab 02/17/15 1955 02/19/15 1133 02/20/15 0036 02/21/15 0731  WBC 5.6 5.3 4.5 7.1  NEUTROABS 2.9  --   --   --   HGB 11.9* 11.4* 11.1* 11.8*  HCT 37.2 35.8 35.3 36.8  MCV 85.8 85.4 86.2 86.4  PLT 135* 141* 130* 141*   Cardiac Enzymes:  Recent Labs Lab 02/19/15 1133 02/19/15 1814 02/20/15 0034  TROPONINI <0.03 <0.03 <0.03    BNP (last 3 results)  Recent Labs  02/08/15 1151 02/17/15 1955  BNP 682.0* 729.0*    ProBNP (last 3 results) No results for input(s): PROBNP in the last 8760 hours.  CBG:  Recent Labs Lab 02/20/15 2342 02/21/15 0724 02/21/15 0757 02/21/15 0839 02/21/15 1013  GLUCAP 109* 45* 45* 53* 67    Radiological Exams on Admission: Dg Chest 1 View  02/20/2015   CLINICAL DATA:  Status post right-sided thoracentesis.  EXAM: CHEST  1 VIEW  COMPARISON:  Chest radiograph 02/19/2015  FINDINGS: Interval decrease in size of the right pleural effusion, status post right thoracentesis today. No visible pneumothorax. Vascular stent in  the left subclavian and left axillary region. Vascular stent in the right axillary region.  Stable heart mediastinal contours. Heart size appears mildly enlarged for portable technique.  There are mild hazy opacities throughout both lungs. Cardiac leads project of the chest.  IMPRESSION: 1. Negative for pneumothorax. 2. Mild hazy opacities throughout both lungs. Cannot exclude mild symmetric airspace disease.   Electronically Signed   By: Curlene Dolphin M.D.   On: 02/20/2015 17:04   US Thoracentesis Asp Pleural Space W/img Guide  02/20/2015   CLINICAL DATA:  Right pleural effusion  EXAM: ULTRASOUND GUIDED RIGHT THORACENTESIS  COMPARISON:  None.  PROCEDURE: An ultrasound guided thoracentesis was thoroughly discussed with the patient and questions answered. The benefits, risks, alternatives and complications were also discussed. The patient understands and wishes to proceed with the procedure. Written consent was obtained.  Ultrasound was performed to localize and mark an adequate pocket of fluid in the right chest. The area was then prepped and draped in the normal sterile fashion. 1% Lidocaine was used for local anesthesia. Under ultrasound guidance a Safe-T-Centesis catheter was introduced. Thoracentesis was performed. The catheter was removed and a dressing applied.  COMPLICATIONS: None.  FINDINGS: A total of approximately 850 cc of clear fluid was removed. A fluid sample wassent for laboratory analysis.  IMPRESSION: Successful ultrasound guided right thoracentesis yielding 850 cc of pleural fluid.   Electronically Signed   By: Marybelle Killings M.D.   On: 02/20/2015 17:16     Assessment/Plan Active Problems:   Pleural effusion   HCAP (healthcare-associated pneumonia)   End-stage renal disease on hemodialysis   1. Bilateral Pleural Effusions -she had thoracentesis  done with improvement in her symptoms. -fluid more likely transudate as her glucose was low on the serum and fluid levels were low  also -cultures are pending majority cells are lymphocytes -would also get a [place] of abdomen to assess for ascitic fluid  2. HCAP -patient has been started on levaquin would continue -follow up cultures suggested  3. CKD on dialysis -continue as per nephrology   Time spent: 1min    I have personally obtained a history, examined the patient, evaluated laboratory and imaging results, formulated the assessment and plan and placed orders.  The Patient requires high complexity decision making for assessment and support.    Allyne Gee, MD [facility] Pulmonary Critical Care Medicine Sleep Medicine

## 2015-02-21 NOTE — Progress Notes (Signed)
[facility]  ROUNDING NOTE   Subjective:  Patient had right thoracentesis yesterday, due for left today. Also due for HD today, orders prepared.   Objective:  Vital signs in last 24 hours:  Temp:  [97.7 F (36.5 C)-98 F (36.7 C)] 98 F (36.7 C) (06/01 0514) Pulse Rate:  [81-85] 84 (06/01 0614) Resp:  [10-18] 18 (06/01 0514) BP: (168-185)/(61-84) 172/68 mmHg (06/01 0614) SpO2:  [99 %-100 %] 100 % (06/01 0800) Weight:  [89.8 kg (197 lb 15.6 oz)] 89.8 kg (197 lb 15.6 oz) (06/01 1013)  Weight change:  Filed Weights   02/19/15 1119 02/21/15 1013  Weight: 89.812 kg (198 lb) 89.8 kg (197 lb 15.6 oz)    Intake/Output: I/O last 3 completed shifts: In: -  Out: 150 [Urine:150]   Intake/Output this shift:     Physical Exam: General: NAD  Head: Normocephalic, atraumatic. Moist oral mucosal membranes  Eyes: Anicteric  Neck: Supple, trachea midline  Lungs:  Diminished BS at bases, normal effort  Heart: Regular rate and rhythm S1S2  Abdomen:  Soft, nontender,   Extremities:  1+ peripheral edema.  Neurologic: Nonfocal, moving all four extremities  Skin: No lesions  Access: LUE AV access    Basic Metabolic Panel:  Recent Labs Lab 02/17/15 1955 02/19/15 1133 02/20/15 0036 02/21/15 0707  NA 134* 137 136 137  K 4.0 3.5 3.7 4.8  CL 95* 99* 96* 98*  CO2 28 31 30 25   GLUCOSE 123* 82 58* 41*  BUN 23* 11 14 18   CREATININE 7.72* 4.78*  4.77* 5.90* 7.97*  CALCIUM 9.0 8.3* 8.5* 8.8*  MG 2.6*  --   --   --   PHOS  --   --   --  3.5    Liver Function Tests:  Recent Labs Lab 02/17/15 1955 02/20/15 0035 02/21/15 0707  AST 20 22  --   ALT 16 16  --   ALKPHOS 124 102  --   BILITOT 0.6 0.4  --   PROT 7.4 6.7  --   ALBUMIN 4.0 3.5 3.3*    Recent Labs Lab 02/17/15 1955 02/20/15 0035  LIPASE 28 20*   No results for input(s): AMMONIA in the last 168 hours.  CBC:  Recent Labs Lab 02/17/15 1955 02/19/15 1133 02/20/15 0036 02/21/15 0731  WBC 5.6  5.3 4.5 7.1  NEUTROABS 2.9  --   --   --   HGB 11.9* 11.4* 11.1* 11.8*  HCT 37.2 35.8 35.3 36.8  MCV 85.8 85.4 86.2 86.4  PLT 135* 141* 130* 141*    Cardiac Enzymes:  Recent Labs Lab 02/19/15 1133 02/19/15 1814 02/20/15 0034  TROPONINI <0.03 <0.03 <0.03    BNP: Invalid input(s): POCBNP  CBG:  Recent Labs Lab 02/20/15 2342 02/21/15 0724 02/21/15 0757 02/21/15 0839 02/21/15 1013  GLUCAP 109* 45* 45* 53* 6    Microbiology: Results for orders placed or performed during the hospital encounter of 02/19/15  Blood culture (routine x 2)     Status: None (Preliminary result)   Collection Time: 02/19/15 12:20 PM  Result Value Ref Range Status   Specimen Description BLOOD  Final   Special Requests Immunocompromised  Final   Culture NO GROWTH < 24 HOURS  Final   Report Status PENDING  Incomplete  Blood culture (routine x 2)     Status: None (Preliminary result)   Collection Time: 02/19/15 12:30 PM  Result Value Ref Range Status   Specimen Description BLOOD  Final   Special  Requests Immunocompromised  Final   Culture NO GROWTH < 24 HOURS  Final   Report Status PENDING  Incomplete  Body fluid culture     Status: None (Preliminary result)   Collection Time: 02/20/15  2:55 PM  Result Value Ref Range Status   Specimen Description PLEURAL  Final   Special Requests NONE  Final   Gram Stain PENDING  Incomplete   Culture NO GROWTH < 24 HOURS  Final   Report Status PENDING  Incomplete    Coagulation Studies: No results for input(s): LABPROT, INR in the last 72 hours.  Urinalysis: No results for input(s): COLORURINE, LABSPEC, PHURINE, GLUCOSEU, HGBUR, BILIRUBINUR, KETONESUR, PROTEINUR, UROBILINOGEN, NITRITE, LEUKOCYTESUR in the last 72 hours.  Invalid input(s): APPERANCEUR    Imaging: Dg Chest 1 View  02/20/2015   CLINICAL DATA:  Status post right-sided thoracentesis.  EXAM: CHEST  1 VIEW  COMPARISON:  Chest radiograph 02/19/2015  FINDINGS: Interval decrease in size of  the right pleural effusion, status post right thoracentesis today. No visible pneumothorax. Vascular stent in the left subclavian and left axillary region. Vascular stent in the right axillary region.  Stable heart mediastinal contours. Heart size appears mildly enlarged for portable technique.  There are mild hazy opacities throughout both lungs. Cardiac leads project of the chest.  IMPRESSION: 1. Negative for pneumothorax. 2. Mild hazy opacities throughout both lungs. Cannot exclude mild symmetric airspace disease.   Electronically Signed   By: Curlene Dolphin M.D.   On: 02/20/2015 17:04   Dg Chest 1 View  02/19/2015   CLINICAL DATA:  Shortness of breath, possible pneumonia  EXAM: CHEST  1 VIEW  COMPARISON:  Frontal chest radiograph dated 02/19/2015 at 1155 hr  FINDINGS: Layering pleural effusion, likely right greater than left when correlating with the frontal radiograph.  Patchy lower lobe opacity, likely right lower lobe atelectasis when corresponding with the frontal radiograph.  IMPRESSION: Right greater than left pleural effusions with right lower lobe atelectasis.   Electronically Signed   By: Julian Hy M.D.   On: 02/19/2015 13:34   Dg Chest Portable 1 View  02/19/2015   CLINICAL DATA:  Dialysis patient.  Shortness of breath.  EXAM: PORTABLE CHEST - 1 VIEW  COMPARISON:  02/17/2015  FINDINGS: Normal heart size. There are small bilateral pleural effusions noted right greater in left. These are mildly increased in volume from previous exam. No airspace consolidation identified. The visualized osseous structures are unremarkable.  IMPRESSION: Mild increase in volume of pleural effusions.  No active disease.   Electronically Signed   By: Kerby Moors M.D.   On: 02/19/2015 12:35   US Thoracentesis Asp Pleural Space W/img Guide  02/20/2015   CLINICAL DATA:  Right pleural effusion  EXAM: ULTRASOUND GUIDED RIGHT THORACENTESIS  COMPARISON:  None.  PROCEDURE: An ultrasound guided thoracentesis was  thoroughly discussed with the patient and questions answered. The benefits, risks, alternatives and complications were also discussed. The patient understands and wishes to proceed with the procedure. Written consent was obtained.  Ultrasound was performed to localize and mark an adequate pocket of fluid in the right chest. The area was then prepped and draped in the normal sterile fashion. 1% Lidocaine was used for local anesthesia. Under ultrasound guidance a Safe-T-Centesis catheter was introduced. Thoracentesis was performed. The catheter was removed and a dressing applied.  COMPLICATIONS: None.  FINDINGS: A total of approximately 850 cc of clear fluid was removed. A fluid sample wassent for laboratory analysis.  IMPRESSION: Successful ultrasound guided  right thoracentesis yielding 850 cc of pleural fluid.   Electronically Signed   By: Marybelle Killings M.D.   On: 02/20/2015 17:16     Medications:     . aspirin EC  81 mg Oral Daily  . atorvastatin  40 mg Oral Daily  . buPROPion  150 mg Oral BID  . cholecalciferol  1,000 Units Oral Daily  . citalopram  20 mg Oral Daily  . clopidogrel  75 mg Oral Daily  . gabapentin  100 mg Oral Daily  . heparin  5,000 Units Subcutaneous 3 times per day  . levofloxacin (LEVAQUIN) IV  500 mg Intravenous Q48H  . lipase/protease/amylase  24,000-48,000 Units Oral 5 X Daily  . omega-3 acid ethyl esters  1 g Oral Daily  . pantoprazole  40 mg Oral BID  . ranolazine  1,000 mg Oral BID  . sevelamer carbonate  800 mg Oral TID WC  . sucralfate  1 g Oral TID WC & HS   sodium chloride, sodium chloride, acetaminophen **OR** acetaminophen, albuterol, alteplase, feeding supplement (NEPRO CARB STEADY), heparin, HYDROcodone-acetaminophen, hydrocortisone, HYDROmorphone (DILAUDID) injection, lidocaine (PF), lidocaine-prilocaine, metoCLOPramide, nitroGLYCERIN, ondansetron (ZOFRAN) IV, pentafluoroprop-tetrafluoroeth, polyethylene glycol, promethazine  Assessment/ Plan:  54 yo AAF  with h/o ESRD, DM (30 yrs with retinopathy, neuropathy), OA, IBS, hyperlipidemia, h/o GI bleed, SHPTH, AOCD, diastolic heart failure , RCA stent Feb 2013, C. diff colitis 7/14, clotted RUE AVF 10/14, admission for shortness of breath 02/20/15.  1.  ESRD on HD MWF:  Pt due for HD today, orders prepared.  2.  Shortness of breath/bilateral pleural effusions:  S/p right thoracentesis yesterday, due for left today. 3.  Anemia fo CKD: continue to hold epogen for now, hgb 11.8 4.  SHPTH of renal origin:  Check phos with HD today, continue renvela.    LOS: 2 Barclay Lennox 6/1/201610:40 AM

## 2015-02-21 NOTE — Progress Notes (Signed)
ANTIBIOTIC CONSULT NOTE - INITIAL  Pharmacy Consult for Levofloxacin  Indication: pneumonia (HCAP)  Allergies  Allergen Reactions  . Ace Inhibitors Swelling  . Codeine Nausea And Vomiting  . Compazine [Prochlorperazine Edisylate] Nausea And Vomiting  . Oxycodone Anxiety  . Tape Rash    Patient Measurements: Height: 5\' 5"  (165.1 cm) Weight: 198 lb (89.812 kg) IBW/kg (Calculated) : 57    Vital Signs: Temp: 98 F (36.7 C) (06/01 0514) Temp Source: Oral (06/01 0514) BP: 172/68 mmHg (06/01 0614) Pulse Rate: 84 (06/01 0614)  Recent Labs  02/19/15 1133 02/20/15 0036 02/21/15 0731  WBC 5.3 4.5 7.1  HGB 11.4* 11.1* 11.8*  PLT 141* 130* 141*  CREATININE 4.78*  4.77* 5.90*  --     Microbiology: Recent Results (from the past 720 hour(s))  Blood culture (routine x 2)     Status: None (Preliminary result)   Collection Time: 02/19/15 12:20 PM  Result Value Ref Range Status   Specimen Description BLOOD  Final   Special Requests Immunocompromised  Final   Culture NO GROWTH < 24 HOURS  Final   Report Status PENDING  Incomplete  Blood culture (routine x 2)     Status: None (Preliminary result)   Collection Time: 02/19/15 12:30 PM  Result Value Ref Range Status   Specimen Description BLOOD  Final   Special Requests Immunocompromised  Final   Culture NO GROWTH < 24 HOURS  Final   Report Status PENDING  Incomplete  Body fluid culture     Status: None (Preliminary result)   Collection Time: 02/20/15  2:55 PM  Result Value Ref Range Status   Specimen Description PLEURAL  Final   Special Requests NONE  Final   Gram Stain PENDING  Incomplete   Culture NO GROWTH < 24 HOURS  Final   Report Status PENDING  Incomplete    Assessment: 53 yo female on HD MWF starting abx for HCAP. Received vancomycin 1gm x 1, zosyn 3.375gm x 1, and levaquin 750mg  x 1 in ED Previous notes indicate patient on levaquin at home, however never started medication.   Goal of Therapy:  Resolution of  infection  Plan:  Will continue current orders for levaquin 500mg  IV Q48H  Pharmacy to follow per consult  Rexene Edison, PharmD Clinical Pharmacist 02/21/2015 8:49 AM

## 2015-02-21 NOTE — Plan of Care (Signed)
Problem: Discharge Progression Outcomes Goal: Other Discharge Outcomes/Goals Plan of Care Progress to Goal:  Pt c/o of pain and nausea early in shift - resolved w/dilaudid and phenergen.  Had dialysis - pulled off 500 mL.  Also had L thoracentesis today -  Pt felt better when she returned from procedure.

## 2015-02-21 NOTE — Procedures (Signed)
US guided left thoracentesis.  400 ml of amber colored fluid was removed without complication.  Minimal blood loss.

## 2015-02-21 NOTE — Progress Notes (Signed)
On call MD notified of high BP 177/72. Received telephone order Hydralazine 10mg  IV once due to pt unable to tolerate anything PO. Will continue to assess.   Hiram Gash [employer]

## 2015-02-21 NOTE — Progress Notes (Signed)
[name] NAME: [name]    MR#:  [id number]  DATE OF BIRTH:  08-01-1961  SUBJECTIVE:  CHIEF COMPLAINT:   Chief Complaint  Patient presents with  . Shortness of Breath   Feeling better today. After thoracentesis breathing much easier with less coughing. Still nauseated and not eating much. Hypoglycemic this morning  Review of Systems  Constitutional: Negative for fever.  Respiratory: Positive for cough. Negative for sputum production and shortness of breath.   Cardiovascular: Negative for chest pain and palpitations.  Gastrointestinal: Positive for nausea and vomiting. Negative for abdominal pain.  Genitourinary: Negative for dysuria.    VITAL SIGNS: Blood pressure 167/63, pulse 86, temperature 98 F (36.7 C), temperature source Oral, resp. rate 17, height 5\' 5"  (1.651 m), weight 89.8 kg (197 lb 15.6 oz), SpO2 100 %.  PHYSICAL EXAMINATION:   GENERAL:  54 y.o.-year-old patient lying in the bed with no acute distress.  EYES: Pupils equal, round, reactive to light and accommodation. No scleral icterus. Extraocular muscles intact.  HEENT: Head atraumatic, normocephalic. Oropharynx and nasopharynx clear.  NECK:  Supple, no jugular venous distention. No thyroid enlargement, no tenderness.  LUNGS: Bibasilar crackles, fair air movement, no respiratory distress, no wheezes or coughing  CARDIOVASCULAR: S1, S2 normal. No murmurs, rubs, or gallops.  ABDOMEN: Soft, nontender, nondistended no guarding or rebound EXTREMITIES: No pedal edema, cyanosis, or clubbing.  NEUROLOGIC: Cranial nerves II through XII are intact. Muscle strength 5/5 in all extremities. Sensation intact. Gait not checked.  PSYCHIATRIC: The patient is alert and oriented x 3.  SKIN: No obvious rash, lesion, or ulcer.   ORDERS/RESULTS REVIEWED:   CBC  Recent Labs Lab 02/17/15 1955 02/19/15 1133 02/20/15 0036 02/21/15 0731  WBC 5.6 5.3 4.5 7.1  HGB  11.9* 11.4* 11.1* 11.8*  HCT 37.2 35.8 35.3 36.8  PLT 135* 141* 130* 141*  MCV 85.8 85.4 86.2 86.4  MCH 27.5 27.2 27.1 27.8  MCHC 32.0 31.8* 31.4* 32.2  RDW 19.2* 19.5* 19.5* 20.2*  LYMPHSABS 1.8  --   --   --   MONOABS 0.5  --   --   --   EOSABS 0.3  --   --   --   BASOSABS 0.1  --   --   --    ------------------------------------------------------------------------------------------------------------------  Chemistries   Recent Labs Lab 02/17/15 1955 02/19/15 1133 02/20/15 0035 02/20/15 0036 02/21/15 0707  NA 134* 137  --  136 137  K 4.0 3.5  --  3.7 4.8  CL 95* 99*  --  96* 98*  CO2 28 31  --  30 25  GLUCOSE 123* 82  --  58* 41*  BUN 23* 11  --  14 18  CREATININE 7.72* 4.78*  4.77*  --  5.90* 7.97*  CALCIUM 9.0 8.3*  --  8.5* 8.8*  MG 2.6*  --   --   --   --   AST 20  --  22  --   --   ALT 16  --  16  --   --   ALKPHOS 124  --  102  --   --   BILITOT 0.6  --  0.4  --   --    ------------------------------------------------------------------------------------------------------------------ estimated creatinine clearance is 8.8 mL/min (by C-G formula based on Cr of 7.97). ------------------------------------------------------------------------------------------------------------------ No results for input(s): TSH, T4TOTAL, T3FREE, THYROIDAB in the last 72 hours.  Invalid input(s): FREET3  Cardiac Enzymes  Recent Labs Lab 02/19/15 1133 02/19/15 1814 02/20/15 0034  TROPONINI <0.03 <0.03 <0.03   ------------------------------------------------------------------------------------------------------------------ Invalid input(s): POCBNP ---------------------------------------------------------------------------------------------------------------  RADIOLOGY: Dg Chest 1 View  02/20/2015   CLINICAL DATA:  Status post right-sided thoracentesis.  EXAM: CHEST  1 VIEW  COMPARISON:  Chest radiograph 02/19/2015  FINDINGS: Interval decrease in size of the right pleural  effusion, status post right thoracentesis today. No visible pneumothorax. Vascular stent in the left subclavian and left axillary region. Vascular stent in the right axillary region.  Stable heart mediastinal contours. Heart size appears mildly enlarged for portable technique.  There are mild hazy opacities throughout both lungs. Cardiac leads project of the chest.  IMPRESSION: 1. Negative for pneumothorax. 2. Mild hazy opacities throughout both lungs. Cannot exclude mild symmetric airspace disease.   Electronically Signed   By: Curlene Dolphin M.D.   On: 02/20/2015 17:04   US Thoracentesis Asp Pleural Space W/img Guide  02/20/2015   CLINICAL DATA:  Right pleural effusion  EXAM: ULTRASOUND GUIDED RIGHT THORACENTESIS  COMPARISON:  None.  PROCEDURE: An ultrasound guided thoracentesis was thoroughly discussed with the patient and questions answered. The benefits, risks, alternatives and complications were also discussed. The patient understands and wishes to proceed with the procedure. Written consent was obtained.  Ultrasound was performed to localize and mark an adequate pocket of fluid in the right chest. The area was then prepped and draped in the normal sterile fashion. 1% Lidocaine was used for local anesthesia. Under ultrasound guidance a Safe-T-Centesis catheter was introduced. Thoracentesis was performed. The catheter was removed and a dressing applied.  COMPLICATIONS: None.  FINDINGS: A total of approximately 850 cc of clear fluid was removed. A fluid sample wassent for laboratory analysis.  IMPRESSION: Successful ultrasound guided right thoracentesis yielding 850 cc of pleural fluid.   Electronically Signed   By: Marybelle Killings M.D.   On: 02/20/2015 17:16    EKG:  Orders placed or performed during the hospital encounter of 02/19/15  . ED EKG  . ED EKG    ASSESSMENT AND PLAN:  Active Problems:   Pleural effusion   HCAP (healthcare-associated pneumonia)   End-stage renal disease on  hemodialysis  Pleural effusion  HCAP (healthcare-associated pneumonia)  End-stage renal disease on hemodialysis  #1 healthcare associated pneumonia:  -Continue Levaquin - Blood cultures negative - Effusion seems to be more transudative then exudative. Culture and cytology are pending she is much more comfortable after thoracentesis. - Pulmonary consult placed  #2 end-stage renal disease on hemodialysis:  - Dialysis today  #3 nausea and vomiting:  - Possibly related to pneumonia. LFTs and lipase are normal - May also be due to hypoglycemia. We'll liberalize her insulin  #4 coronary artery disease with history of MI:  - Cardiac enzymes negative. No events on telemetry. No chest pain  #5 hypoglycemia: - Discontinue Levemir. Liberalize diet. Continue with Reglan and Zofran  DRUG ALLERGIES:  Allergies  Allergen Reactions  . Ace Inhibitors Swelling  . Codeine Nausea And Vomiting  . Compazine [Prochlorperazine Edisylate] Nausea And Vomiting  . Oxycodone Anxiety  . Tape Rash    CODE STATUS:     Code Status Orders        Start     Ordered   02/19/15 1615  Full code   Continuous     02/19/15 1614      TOTAL TIME TAKING CARE OF THIS PATIENT: 40  minutes.    Myrtis Ser M.D on 02/21/2015 at 1:24 PM  Between 7am  to 6pm - Pager - [phone]  After 6pm go to [email or web address] - password [email or web address] [facility]  Office  [phone]  CC: Primary care physician; Ellamae Sia, MD

## 2015-02-21 NOTE — Plan of Care (Signed)
Problem: Discharge Progression Outcomes Goal: Discharge plan in place and appropriate Individualization  Pt prefers to be called [name]. Patient receives hemodialysis MWF. Patient is a high falls risk, toileting offered every hour. Bed alarm on. Patient has history of ESRD, HTN, DM, Gerd, CAD, Collagen Vascular disease, MI, diabetic neuropathy, controlled with home medications.    Goal: Other Discharge Outcomes/Goals Plan of care progress to goals: 1. C/o headache but refused pain medication during the night  2. Hemodynamically:              -vitals stable, afebrile. Remains on 3L oxygen.  -NSR 72 on telemetry              -blood sugar 40 when checked at bedtime, 15 min later 40. D 50% given. Blood sugar up to 109. Pt remained asymptomatic  3. Complication: n/v relieved by PRN medications with relief. 4. Pt states she did not eat during the day yesterday due to nausea  Left thoracentesis scheduled for today.  No fall/injury this shift. Will continue to assess.

## 2015-02-22 ENCOUNTER — Inpatient Hospital Stay: Payer: Medicare Other

## 2015-02-22 ENCOUNTER — Encounter: Payer: Self-pay | Admitting: Radiology

## 2015-02-22 LAB — RENAL FUNCTION PANEL
Albumin: 2.9 g/dL — ABNORMAL LOW (ref 3.5–5.0)
Anion gap: 8 (ref 5–15)
BUN: 8 mg/dL (ref 6–20)
CO2: 31 mmol/L (ref 22–32)
Calcium: 8.3 mg/dL — ABNORMAL LOW (ref 8.9–10.3)
Chloride: 98 mmol/L — ABNORMAL LOW (ref 101–111)
Creatinine, Ser: 4.66 mg/dL — ABNORMAL HIGH (ref 0.44–1.00)
GFR calc Af Amer: 11 mL/min — ABNORMAL LOW (ref >60)
GFR calc non Af Amer: 10 mL/min — ABNORMAL LOW (ref >60)
Glucose, Bld: 139 mg/dL — ABNORMAL HIGH (ref 65–99)
Phosphorus: 2.5 mg/dL (ref 2.5–4.6)
Potassium: 4.4 mmol/L (ref 3.5–5.1)
Sodium: 137 mmol/L (ref 135–145)

## 2015-02-22 LAB — BODY FLUID CELL COUNT WITH DIFFERENTIAL
Eos, Fluid: 0 %
Lymphs, Fluid: 70 %
Monocyte-Macrophage-Serous Fluid: 6 % — ABNORMAL LOW (ref 50–90)
Neutrophil Count, Fluid: 24 % (ref 0–25)
Other Cells, Fluid: 0 %
Total Nucleated Cell Count, Fluid: 468 cu mm (ref 0–1000)

## 2015-02-22 LAB — COMPREHENSIVE METABOLIC PANEL
ALT: 15 U/L (ref 14–54)
AST: 38 U/L (ref 15–41)
Albumin: 3.2 g/dL — ABNORMAL LOW (ref 3.5–5.0)
Alkaline Phosphatase: 102 U/L (ref 38–126)
Anion gap: 9 (ref 5–15)
BUN: 9 mg/dL (ref 6–20)
CO2: 28 mmol/L (ref 22–32)
Calcium: 8.4 mg/dL — ABNORMAL LOW (ref 8.9–10.3)
Chloride: 99 mmol/L — ABNORMAL LOW (ref 101–111)
Creatinine, Ser: 5.04 mg/dL — ABNORMAL HIGH (ref 0.44–1.00)
GFR calc Af Amer: 10 mL/min — ABNORMAL LOW (ref >60)
GFR calc non Af Amer: 9 mL/min — ABNORMAL LOW (ref >60)
Glucose, Bld: 132 mg/dL — ABNORMAL HIGH (ref 65–99)
Potassium: 4.4 mmol/L (ref 3.5–5.1)
Sodium: 136 mmol/L (ref 135–145)
Total Bilirubin: 1.5 mg/dL — ABNORMAL HIGH (ref 0.3–1.2)
Total Protein: 6.7 g/dL (ref 6.5–8.1)

## 2015-02-22 LAB — GLUCOSE, CAPILLARY
Glucose-Capillary: 179 mg/dL — ABNORMAL HIGH (ref 65–99)
Glucose-Capillary: 137 mg/dL — ABNORMAL HIGH (ref 65–99)
Glucose-Capillary: 114 mg/dL — ABNORMAL HIGH (ref 65–99)
Glucose-Capillary: 196 mg/dL — ABNORMAL HIGH (ref 65–99)
Glucose-Capillary: 200 mg/dL — ABNORMAL HIGH (ref 65–99)

## 2015-02-22 LAB — PROTEIN, BODY FLUID: Total protein, fluid: <3 g/dL

## 2015-02-22 LAB — LIPASE, BLOOD: Lipase: 20 U/L — ABNORMAL LOW (ref 22–51)

## 2015-02-22 LAB — HEMOGLOBIN A1C: Hgb A1c MFr Bld: 4.9 % (ref 4.0–6.0)

## 2015-02-22 LAB — CYTOLOGY - NON PAP

## 2015-02-22 LAB — GLUCOSE, SEROUS FLUID: Glucose, Fluid: 125 mg/dL

## 2015-02-22 LAB — HEPATITIS B SURFACE ANTIGEN: Hepatitis B Surface Ag: NEGATIVE — AB

## 2015-02-22 MED ORDER — RANOLAZINE ER 500 MG PO TB12
500.0000 mg | ORAL_TABLET | Freq: Two times a day (BID) | ORAL | Status: DC
  Administered 2015-02-22 – 2015-02-24 (×3): 500 mg via ORAL
  Filled 2015-02-22 (×3): qty 1

## 2015-02-22 MED ORDER — IOHEXOL 300 MG/ML  SOLN
100.0000 mL | Freq: Once | INTRAMUSCULAR | Status: AC | PRN
  Administered 2015-02-22: 100 mL via INTRAVENOUS

## 2015-02-22 MED ORDER — IOHEXOL 240 MG/ML SOLN
25.0000 mL | INTRAMUSCULAR | Status: AC
  Administered 2015-02-22 (×2): 25 mL via ORAL

## 2015-02-22 MED ORDER — METOCLOPRAMIDE HCL 5 MG PO TABS
10.0000 mg | ORAL_TABLET | Freq: Four times a day (QID) | ORAL | Status: DC | PRN
  Administered 2015-02-22 – 2015-02-23 (×2): 10 mg via ORAL
  Filled 2015-02-22 (×2): qty 2

## 2015-02-22 NOTE — Plan of Care (Signed)
Problem: Discharge Progression Outcomes Goal: Other Discharge Outcomes/Goals Plan of care progress to goals: 1. No c/o pain during the night   2. Hemodynamically:               -vitals stable, afebrile.              -Sinus brady 59 on telemetry   3. Complication: n/v relieved by PRN medications with relief. 4. Pt able to tolerate renal diet 5. Left thoracentesis site dry/intact. Pt states she feels much better since the procedure No fall/injury this shift. Will continue to assess.

## 2015-02-22 NOTE — Progress Notes (Signed)
[facility]  ROUNDING NOTE   Subjective:  Patient s/p right and left thoracentesis. Overall doing better, breathing easier per her report. Had HD yesterday. Still having some vomiting.  Objective:  Vital signs in last 24 hours:  Temp:  [98 F (36.7 C)-99.8 F (37.7 C)] 98.8 F (37.1 C) (06/02 0549) Pulse Rate:  [62-96] 62 (06/02 0549) Resp:  [13-23] 16 (06/02 0549) BP: (132-189)/(47-84) 143/47 mmHg (06/02 0549) SpO2:  [97 %-100 %] 97 % (06/02 0549) Weight:  [86.4 kg (190 lb 7.6 oz)] 86.4 kg (190 lb 7.6 oz) (06/01 1511)  Weight change:  Filed Weights   02/19/15 1119 02/21/15 1013 02/21/15 1511  Weight: 89.812 kg (198 lb) 89.8 kg (197 lb 15.6 oz) 86.4 kg (190 lb 7.6 oz)    Intake/Output: I/O last 3 completed shifts: In: 240 [P.O.:240] Out: 650 [Urine:150; Other:500]   Intake/Output this shift:     Physical Exam: General: NAD  Head: Normocephalic, atraumatic. Moist oral mucosal membranes  Eyes: Anicteric  Neck: Supple, trachea midline  Lungs:  Diminished BS at bases, normal effort  Heart: Regular rate and rhythm S1S2  Abdomen:  Soft, nontender,   Extremities:  1+ peripheral edema.  Neurologic: Awake, alert, follows commands  Skin: No rashes  Access: LUE AV access    Basic Metabolic Panel:  Recent Labs Lab 02/17/15 1955 02/19/15 1133 02/20/15 0036 02/21/15 0707 02/21/15 1152 02/22/15 0526  NA 134* 137 136 137  --  137  K 4.0 3.5 3.7 4.8  --  4.4  CL 95* 99* 96* 98*  --  98*  CO2 28 31 30 25   --  31  GLUCOSE 123* 82 58* 41*  --  139*  BUN 23* 11 14 18   --  8  CREATININE 7.72* 4.78*  4.77* 5.90* 7.97*  --  4.66*  CALCIUM 9.0 8.3* 8.5* 8.8*  --  8.3*  MG 2.6*  --   --   --   --   --   PHOS  --   --   --  3.5 3.1 2.5    Liver Function Tests:  Recent Labs Lab 02/17/15 1955 02/20/15 0035 02/21/15 0707 02/22/15 0526  AST 20 22  --   --   ALT 16 16  --   --   ALKPHOS 124 102  --   --   BILITOT 0.6 0.4  --   --   PROT 7.4 6.7  --    --   ALBUMIN 4.0 3.5 3.3* 2.9*    Recent Labs Lab 02/17/15 1955 02/20/15 0035 02/21/15 1152  LIPASE 28 20*  --   AMYLASE  --   --  23*   No results for input(s): AMMONIA in the last 168 hours.  CBC:  Recent Labs Lab 02/17/15 1955 02/19/15 1133 02/20/15 0036 02/21/15 0731  WBC 5.6 5.3 4.5 7.1  NEUTROABS 2.9  --   --   --   HGB 11.9* 11.4* 11.1* 11.8*  HCT 37.2 35.8 35.3 36.8  MCV 85.8 85.4 86.2 86.4  PLT 135* 141* 130* 141*    Cardiac Enzymes:  Recent Labs Lab 02/19/15 1133 02/19/15 1814 02/20/15 0034  TROPONINI <0.03 <0.03 <0.03    BNP: Invalid input(s): POCBNP  CBG:  Recent Labs Lab 02/21/15 0839 02/21/15 1013 02/21/15 2210 02/22/15 0204 02/22/15 0719  GLUCAP 53* 67 144* 179* 137*    Microbiology: Results for orders placed or performed during the hospital encounter of 02/19/15  Blood culture (routine x 2)  Status: None (Preliminary result)   Collection Time: 02/19/15 12:20 PM  Result Value Ref Range Status   Specimen Description BLOOD  Final   Special Requests Immunocompromised  Final   Culture NO GROWTH 2 DAYS  Final   Report Status PENDING  Incomplete  Blood culture (routine x 2)     Status: None (Preliminary result)   Collection Time: 02/19/15 12:30 PM  Result Value Ref Range Status   Specimen Description BLOOD  Final   Special Requests Immunocompromised  Final   Culture NO GROWTH 2 DAYS  Final   Report Status PENDING  Incomplete  Body fluid culture     Status: None (Preliminary result)   Collection Time: 02/20/15  2:55 PM  Result Value Ref Range Status   Specimen Description PLEURAL  Final   Special Requests NONE  Final   Gram Stain   Final    FEW WBC SEEN RARE RED BLOOD CELLS FEW GRAM VARIABLE ROD    Culture NO GROWTH < 24 HOURS  Final   Report Status PENDING  Incomplete    Coagulation Studies: No results for input(s): LABPROT, INR in the last 72 hours.  Urinalysis: No results for input(s): COLORURINE, LABSPEC, PHURINE,  GLUCOSEU, HGBUR, BILIRUBINUR, KETONESUR, PROTEINUR, UROBILINOGEN, NITRITE, LEUKOCYTESUR in the last 72 hours.  Invalid input(s): APPERANCEUR    Imaging: Dg Chest 1 View  02/21/2015   CLINICAL DATA:  Thoracentesis  EXAM: CHEST  1 VIEW  COMPARISON:  02/20/2015  FINDINGS: There is no evidence of pneumothorax. Cardiomegaly is stable. Bibasilar opacity is stable.  IMPRESSION: No pneumothorax post thoracentesis.  Persistent bibasilar opacity.   Electronically Signed   By: Marybelle Killings M.D.   On: 02/21/2015 17:25   Dg Chest 1 View  02/20/2015   CLINICAL DATA:  Status post right-sided thoracentesis.  EXAM: CHEST  1 VIEW  COMPARISON:  Chest radiograph 02/19/2015  FINDINGS: Interval decrease in size of the right pleural effusion, status post right thoracentesis today. No visible pneumothorax. Vascular stent in the left subclavian and left axillary region. Vascular stent in the right axillary region.  Stable heart mediastinal contours. Heart size appears mildly enlarged for portable technique.  There are mild hazy opacities throughout both lungs. Cardiac leads project of the chest.  IMPRESSION: 1. Negative for pneumothorax. 2. Mild hazy opacities throughout both lungs. Cannot exclude mild symmetric airspace disease.   Electronically Signed   By: Curlene Dolphin M.D.   On: 02/20/2015 17:04   US Abdomen Limited  02/21/2015   CLINICAL DATA:  Ascites.  EXAM: LIMITED ABDOMEN ULTRASOUND FOR ASCITES  TECHNIQUE: Limited ultrasound survey for ascites was performed in all four abdominal quadrants.  COMPARISON:  02/21/2015.  FINDINGS: There is no ascites identified in all 4 quadrants evaluated.  IMPRESSION: Negative for ascites.   Electronically Signed   By: Dereck Ligas M.D.   On: 02/21/2015 18:17   US Thoracentesis Asp Pleural Space W/img Guide  02/21/2015   CLINICAL DATA:  Shortness of breath.  EXAM: ULTRASOUND GUIDED LEFT THORACENTESIS  COMPARISON:  Chest radiograph 02/20/2015  PROCEDURE: An ultrasound guided  thoracentesis was thoroughly discussed with the patient and questions answered. The benefits, risks, alternatives and complications were also discussed. The patient understands and wishes to proceed with the procedure. Written consent was obtained.  Ultrasound was performed to localize and mark an adequate pocket of fluid in the left posterior chest. The area was then prepped and draped in the normal sterile fashion. 1% Lidocaine was used for local anesthesia. Under  ultrasound guidance a 19 gauge Yueh catheter was introduced. Thoracentesis was performed. The catheter was removed and a dressing applied.  COMPLICATIONS: None.  FINDINGS: A total of approximately 400 mL of amber color fluid was removed. A fluid sample was sent for laboratory analysis.  IMPRESSION: Successful ultrasound guided left thoracentesis yielding 400 mL of pleural fluid.   Electronically Signed   By: Markus Daft M.D.   On: 02/21/2015 17:13   US Thoracentesis Asp Pleural Space W/img Guide  02/20/2015   CLINICAL DATA:  Right pleural effusion  EXAM: ULTRASOUND GUIDED RIGHT THORACENTESIS  COMPARISON:  None.  PROCEDURE: An ultrasound guided thoracentesis was thoroughly discussed with the patient and questions answered. The benefits, risks, alternatives and complications were also discussed. The patient understands and wishes to proceed with the procedure. Written consent was obtained.  Ultrasound was performed to localize and mark an adequate pocket of fluid in the right chest. The area was then prepped and draped in the normal sterile fashion. 1% Lidocaine was used for local anesthesia. Under ultrasound guidance a Safe-T-Centesis catheter was introduced. Thoracentesis was performed. The catheter was removed and a dressing applied.  COMPLICATIONS: None.  FINDINGS: A total of approximately 850 cc of clear fluid was removed. A fluid sample wassent for laboratory analysis.  IMPRESSION: Successful ultrasound guided right thoracentesis yielding 850 cc of  pleural fluid.   Electronically Signed   By: Marybelle Killings M.D.   On: 02/20/2015 17:16     Medications:     . amLODipine  10 mg Oral Daily  . aspirin EC  81 mg Oral Daily  . atorvastatin  40 mg Oral Daily  . buPROPion  150 mg Oral BID  . carvedilol  25 mg Oral BID  . cholecalciferol  1,000 Units Oral Daily  . citalopram  20 mg Oral Daily  . clopidogrel  75 mg Oral Daily  . feeding supplement (NEPRO CARB STEADY)  237 mL Oral Daily  . gabapentin  100 mg Oral Daily  . heparin  5,000 Units Subcutaneous 3 times per day  . levofloxacin (LEVAQUIN) IV  500 mg Intravenous Q48H  . lipase/protease/amylase  24,000-48,000 Units Oral 5 X Daily  . omega-3 acid ethyl esters  1 g Oral Daily  . pantoprazole  40 mg Oral BID  . ranolazine  1,000 mg Oral BID  . sevelamer carbonate  800 mg Oral TID WC  . sucralfate  1 g Oral TID WC & HS   sodium chloride, sodium chloride, acetaminophen **OR** acetaminophen, albuterol, heparin, HYDROcodone-acetaminophen, hydrocortisone, HYDROmorphone (DILAUDID) injection, lidocaine (PF), lidocaine-prilocaine, metoCLOPramide, nitroGLYCERIN, ondansetron (ZOFRAN) IV, pentafluoroprop-tetrafluoroeth, polyethylene glycol, promethazine  Assessment/ Plan:  54 yo AAF with h/o ESRD, DM (30 yrs with retinopathy, neuropathy), OA, IBS, hyperlipidemia, h/o GI bleed, SHPTH, AOCD, diastolic heart failure , RCA stent Feb 2013, C. diff colitis 7/14, clotted RUE AVF 10/14, admission for shortness of breath 02/20/15.  1.  ESRD on HD MWF: Pt had HD yesterday, no acute indication for HD today, will plan for HD again tomorrow. 2.  Shortness of breath/bilateral pleural effusions:  S/p right and left thoracentesis.  Breathing improved.   3.  Anemia of CKD: epogen currently on hold.  4.  SHPTH of renal origin:  Phos 2.5 and acceptable, continue renvela.    LOS: 3 Makaylynn Bonillas 6/2/201610:44 AM

## 2015-02-22 NOTE — Progress Notes (Addendum)
[facility] [place] NAME: [name]    MR#:  [id number]  DATE OF BIRTH:  [birth year]  SUBJECTIVE:  CHIEF COMPLAINT:   Chief Complaint  Patient presents with  . Shortness of Breath   Still nauseated. Not holding down anything substantial. Breathing much easier.    Review of Systems  Constitutional: Negative for fever.  Respiratory: Negative for cough, shortness of breath and wheezing.   Cardiovascular: Negative for chest pain and palpitations.  Gastrointestinal: Positive for nausea, vomiting and abdominal pain.  Genitourinary: Negative for dysuria.    VITAL SIGNS: Blood pressure 158/62, pulse 69, temperature 98 F (36.7 C), temperature source Oral, resp. rate 16, height 5\' 5"  (1.651 m), weight 86.4 kg (190 lb 7.6 oz), SpO2 96 %.  PHYSICAL EXAMINATION:   GENERAL:  54 y.o.-year-old patient lying in the bed with no acute distress.  EYES: Pupils equal, round, reactive to light and accommodation. No scleral icterus. Extraocular muscles intact.  HEENT: Head atraumatic, normocephalic. Oropharynx and nasopharynx clear.  NECK:  Supple, no jugular venous distention. No thyroid enlargement, no tenderness.  LUNGS: Bibasilar crackles, fair air movement, no respiratory distress, no wheezes or coughing  CARDIOVASCULAR: S1, S2 normal. No murmurs, rubs, or gallops.  ABDOMEN: Soft, tenderness in the mid epigastric area, slightly distended, no guarding no rebound EXTREMITIES: No pedal edema, cyanosis, or clubbing.  NEUROLOGIC: Cranial nerves II through XII are intact. Muscle strength 5/5 in all extremities. Sensation intact. Gait not checked.  PSYCHIATRIC: The patient is alert and oriented x 3.  SKIN: No obvious rash, lesion, or ulcer.   ORDERS/RESULTS REVIEWED:   CBC  Recent Labs Lab 02/17/15 1955 02/19/15 1133 02/20/15 0036 02/21/15 0731  WBC 5.6 5.3 4.5 7.1  HGB 11.9* 11.4* 11.1* 11.8*  HCT 37.2 35.8 35.3 36.8  PLT 135* 141* 130*  141*  MCV 85.8 85.4 86.2 86.4  MCH 27.5 27.2 27.1 27.8  MCHC 32.0 31.8* 31.4* 32.2  RDW 19.2* 19.5* 19.5* 20.2*  LYMPHSABS 1.8  --   --   --   MONOABS 0.5  --   --   --   EOSABS 0.3  --   --   --   BASOSABS 0.1  --   --   --    ------------------------------------------------------------------------------------------------------------------  Chemistries   Recent Labs Lab 02/17/15 1955 02/19/15 1133 02/20/15 0035 02/20/15 0036 02/21/15 0707 02/22/15 0526 02/22/15 1139  NA 134* 137  --  136 137 137 136  K 4.0 3.5  --  3.7 4.8 4.4 4.4  CL 95* 99*  --  96* 98* 98* 99*  CO2 28 31  --  30 25 31 28   GLUCOSE 123* 82  --  58* 41* 139* 132*  BUN 23* 11  --  14 18 8 9   CREATININE 7.72* 4.78*  4.77*  --  5.90* 7.97* 4.66* 5.04*  CALCIUM 9.0 8.3*  --  8.5* 8.8* 8.3* 8.4*  MG 2.6*  --   --   --   --   --   --   AST 20  --  22  --   --   --  38  ALT 16  --  16  --   --   --  15  ALKPHOS 124  --  102  --   --   --  102  BILITOT 0.6  --  0.4  --   --   --  1.5*   ------------------------------------------------------------------------------------------------------------------ estimated  creatinine clearance is 13.7 mL/min (by C-G formula based on Cr of 5.04). ------------------------------------------------------------------------------------------------------------------ No results for input(s): TSH, T4TOTAL, T3FREE, THYROIDAB in the last 72 hours.  Invalid input(s): FREET3  Cardiac Enzymes  Recent Labs Lab 02/19/15 1133 02/19/15 1814 02/20/15 0034  TROPONINI <0.03 <0.03 <0.03   ------------------------------------------------------------------------------------------------------------------ Invalid input(s): POCBNP ---------------------------------------------------------------------------------------------------------------  RADIOLOGY: Dg Chest 1 View  02/21/2015   CLINICAL DATA:  Thoracentesis  EXAM: CHEST  1 VIEW  COMPARISON:  02/20/2015  FINDINGS: There is no evidence of  pneumothorax. Cardiomegaly is stable. Bibasilar opacity is stable.  IMPRESSION: No pneumothorax post thoracentesis.  Persistent bibasilar opacity.   Electronically Signed   By: Marybelle Killings M.D.   On: 02/21/2015 17:25   Dg Chest 1 View  02/20/2015   CLINICAL DATA:  Status post right-sided thoracentesis.  EXAM: CHEST  1 VIEW  COMPARISON:  Chest radiograph 02/19/2015  FINDINGS: Interval decrease in size of the right pleural effusion, status post right thoracentesis today. No visible pneumothorax. Vascular stent in the left subclavian and left axillary region. Vascular stent in the right axillary region.  Stable heart mediastinal contours. Heart size appears mildly enlarged for portable technique.  There are mild hazy opacities throughout both lungs. Cardiac leads project of the chest.  IMPRESSION: 1. Negative for pneumothorax. 2. Mild hazy opacities throughout both lungs. Cannot exclude mild symmetric airspace disease.   Electronically Signed   By: Curlene Dolphin M.D.   On: 02/20/2015 17:04   US Abdomen Limited  02/21/2015   CLINICAL DATA:  Ascites.  EXAM: LIMITED ABDOMEN ULTRASOUND FOR ASCITES  TECHNIQUE: Limited ultrasound survey for ascites was performed in all four abdominal quadrants.  COMPARISON:  02/21/2015.  FINDINGS: There is no ascites identified in all 4 quadrants evaluated.  IMPRESSION: Negative for ascites.   Electronically Signed   By: Dereck Ligas M.D.   On: 02/21/2015 18:17   US Thoracentesis Asp Pleural Space W/img Guide  02/21/2015   CLINICAL DATA:  Shortness of breath.  EXAM: ULTRASOUND GUIDED LEFT THORACENTESIS  COMPARISON:  Chest radiograph 02/20/2015  PROCEDURE: An ultrasound guided thoracentesis was thoroughly discussed with the patient and questions answered. The benefits, risks, alternatives and complications were also discussed. The patient understands and wishes to proceed with the procedure. Written consent was obtained.  Ultrasound was performed to localize and mark an adequate  pocket of fluid in the left posterior chest. The area was then prepped and draped in the normal sterile fashion. 1% Lidocaine was used for local anesthesia. Under ultrasound guidance a 19 gauge Yueh catheter was introduced. Thoracentesis was performed. The catheter was removed and a dressing applied.  COMPLICATIONS: None.  FINDINGS: A total of approximately 400 mL of amber color fluid was removed. A fluid sample was sent for laboratory analysis.  IMPRESSION: Successful ultrasound guided left thoracentesis yielding 400 mL of pleural fluid.   Electronically Signed   By: Markus Daft M.D.   On: 02/21/2015 17:13   US Thoracentesis Asp Pleural Space W/img Guide  02/20/2015   CLINICAL DATA:  Right pleural effusion  EXAM: ULTRASOUND GUIDED RIGHT THORACENTESIS  COMPARISON:  None.  PROCEDURE: An ultrasound guided thoracentesis was thoroughly discussed with the patient and questions answered. The benefits, risks, alternatives and complications were also discussed. The patient understands and wishes to proceed with the procedure. Written consent was obtained.  Ultrasound was performed to localize and mark an adequate pocket of fluid in the right chest. The area was then prepped and draped in the normal sterile fashion. 1%  Lidocaine was used for local anesthesia. Under ultrasound guidance a Safe-T-Centesis catheter was introduced. Thoracentesis was performed. The catheter was removed and a dressing applied.  COMPLICATIONS: None.  FINDINGS: A total of approximately 850 cc of clear fluid was removed. A fluid sample wassent for laboratory analysis.  IMPRESSION: Successful ultrasound guided right thoracentesis yielding 850 cc of pleural fluid.   Electronically Signed   By: Marybelle Killings M.D.   On: 02/20/2015 17:16    EKG:  Orders placed or performed during the hospital encounter of 02/19/15  . ED EKG  . ED EKG    ASSESSMENT AND PLAN:  Active Problems:   Pleural effusion   HCAP (healthcare-associated pneumonia)    End-stage renal disease on hemodialysis  Pleural effusion  HCAP (healthcare-associated pneumonia)  End-stage renal disease on hemodialysis  #1 healthcare associated pneumonia:  -Continue Levaquin - Blood cultures negative, pleural effusion with gram variable rods - Appreciate pulmonology consultation - Status post right sided paracentesis on 5/31 and left sided thoracentesis on 6/1, breathing much better  #2 end-stage renal disease on hemodialysis:  - Nephrology following, dialysis Monday Wednesday Friday  #3 nausea and vomiting:  - Persistent. Not likely associated with pneumonia which is being treated. LFTs and lipase have been normal. Hypoglycemia corrected. She does have history of diabetic gastroparesis which may be the cause. She is on Reglan already. Ultrasound of abdomen negative for ascites.  - Continue Protonix and Carafate Ammann Phenergan - Will increase dose of Reglan and consult gastroenterology.  - Unable to discharge until she is able to take by mouth - CT abdomen pelvis due to persistent nausea vomiting and abdominal pain  #4 coronary artery disease with history of MI:  - Cardiac enzymes negative. No events on telemetry. No chest pain -Continue Plavix, aspirin, beta blocker -Decreased dose of Ranexa on pharmacy recommendation as it is renally excreted she has a prolonging QT  #5 hypoglycemia: - Discontinue Levemir. Liberalize diet. Continue with Reglan and Zofran  #6 hypertension controlled on amlodipine, carvedilol  DRUG ALLERGIES:  Allergies  Allergen Reactions  . Ace Inhibitors Swelling  . Codeine Nausea And Vomiting  . Compazine [Prochlorperazine Edisylate] Nausea And Vomiting  . Oxycodone Anxiety  . Tape Rash    CODE STATUS:     Code Status Orders        Start     Ordered   02/19/15 1615  Full code   Continuous     02/19/15 1614      TOTAL TIME TAKING CARE OF THIS PATIENT: 40  minutes.    Myrtis Ser M.D on 02/22/2015 at 1:47  PM  Between 7am to 6pm - Pager - [phone]  After 6pm go to [email or web address] - password [email or web address] [facility]  Office  [phone]  CC: Primary care physician; Ellamae Sia, MD

## 2015-02-22 NOTE — Plan of Care (Signed)
Problem: Discharge Progression Outcomes Goal: Other Discharge Outcomes/Goals Plan of Care Progress to Goal:  Pt only c/o of abd pain and nausea at beginning of shift. Pt tolerated drinking contrast for abd CT after having Reglan. Pt seems much improved. Probable d/c tomorrow.

## 2015-02-22 NOTE — Plan of Care (Signed)
Problem: Discharge Progression Outcomes Goal: Discharge plan in place and appropriate Individualization  Pt prefers to be called [name]. Patient receives hemodialysis MWF. Patient is a high falls risk, toileting offered every hour. Bed alarm on. Patient has history of ESRD, HTN, DM, Gerd, CAD, Collagen Vascular disease, MI, diabetic neuropathy, controlled with home medications

## 2015-02-23 LAB — GLUCOSE, CAPILLARY
Glucose-Capillary: 165 mg/dL — ABNORMAL HIGH (ref 65–99)
Glucose-Capillary: 108 mg/dL — ABNORMAL HIGH (ref 65–99)
Glucose-Capillary: 154 mg/dL — ABNORMAL HIGH (ref 65–99)

## 2015-02-23 LAB — BASIC METABOLIC PANEL
Anion gap: 10 (ref 5–15)
BUN: 12 mg/dL (ref 6–20)
CO2: 28 mmol/L (ref 22–32)
Calcium: 8.6 mg/dL — ABNORMAL LOW (ref 8.9–10.3)
Chloride: 94 mmol/L — ABNORMAL LOW (ref 101–111)
Creatinine, Ser: 5.58 mg/dL — ABNORMAL HIGH (ref 0.44–1.00)
GFR calc Af Amer: 9 mL/min — ABNORMAL LOW (ref >60)
GFR calc non Af Amer: 8 mL/min — ABNORMAL LOW (ref >60)
Glucose, Bld: 178 mg/dL — ABNORMAL HIGH (ref 65–99)
Potassium: 4 mmol/L (ref 3.5–5.1)
Sodium: 132 mmol/L — ABNORMAL LOW (ref 135–145)

## 2015-02-23 LAB — CBC
HCT: 34.1 % — ABNORMAL LOW (ref 35.0–47.0)
Hemoglobin: 10.7 g/dL — ABNORMAL LOW (ref 12.0–16.0)
MCH: 27 pg (ref 26.0–34.0)
MCHC: 31.4 g/dL — ABNORMAL LOW (ref 32.0–36.0)
MCV: 85.9 fL (ref 80.0–100.0)
Platelets: 114 10*3/uL — ABNORMAL LOW (ref 150–440)
RBC: 3.97 MIL/uL (ref 3.80–5.20)
RDW: 20.6 % — ABNORMAL HIGH (ref 11.5–14.5)
WBC: 5.2 10*3/uL (ref 3.6–11.0)

## 2015-02-23 LAB — AMMONIA: Ammonia: 17 umol/L (ref 9–35)

## 2015-02-23 LAB — LIPASE, BLOOD: Lipase: 21 U/L — ABNORMAL LOW (ref 22–51)

## 2015-02-23 MED ORDER — DOCUSATE SODIUM 100 MG PO CAPS
100.0000 mg | ORAL_CAPSULE | Freq: Two times a day (BID) | ORAL | Status: DC
  Administered 2015-02-23 – 2015-02-24 (×3): 100 mg via ORAL
  Filled 2015-02-23 (×4): qty 1

## 2015-02-23 MED ORDER — ONDANSETRON HCL 4 MG/2ML IJ SOLN
4.0000 mg | Freq: Four times a day (QID) | INTRAMUSCULAR | Status: DC
  Administered 2015-02-23 – 2015-02-24 (×4): 4 mg via INTRAVENOUS
  Filled 2015-02-23 (×3): qty 2

## 2015-02-23 NOTE — Progress Notes (Signed)
Nutrition Follow-up  INTERVENTION: Meals and Snacks: Cater to patient preferences Medical Food Supplement Therapy: will recommend continuing  Nepro shake as ordered  NUTRITION DIAGNOSIS:  Inadequate oral intake related to inability to eat as evidenced by NPO status  GOAL:  Patient will meet greater than or equal to 90% of their needs  MONITOR:   (Energy Intake, Electrolyte and renal Profile, Glucose Profile, Digestive system, Anthropometrics)  REASON FOR ASSESSMENT:   (Follow-up)    ASSESSMENT:  Pt out of room on visit at HD. Pt with n/v per MD note, CT negative with questionable need for EGD.    PO Intake: per RN Dedra pt eating very little secondary to n/v. Per I and O chart pt eating 60% of dinner last night and otherwise only bites and sips.   Medications: vitamin D, colace, Creon, Renvela, carafate Labs: Electrolyte and Renal Profile:  Recent Labs Lab 02/17/15 1955  02/21/15 0707 02/21/15 1152 02/22/15 0526 02/22/15 1139 02/23/15 0501  BUN 23*  < > 18  --  8 9 12   CREATININE 7.72*  < > 7.97*  --  4.66* 5.04* 5.58*  NA 134*  < > 137  --  137 136 132*  K 4.0  < > 4.8  --  4.4 4.4 4.0  MG 2.6*  --   --   --   --   --   --   PHOS  --   --  3.5 3.1 2.5  --   --   < > = values in this interval not displayed. Glucose Profile:  Recent Labs  02/22/15 1611 02/22/15 2156 02/23/15 0717  GLUCAP 196* 200* 165*   Protein Profile:  Recent Labs Lab 02/21/15 0707 02/22/15 0526 02/22/15 1139  ALBUMIN 3.3* 2.9* 3.2*   Anthropometrics: Weight trend since admission Filed Weights   02/21/15 1511 02/23/15 1019 02/23/15 1458  Weight: 190 lb 7.6 oz (86.4 kg) 190 lb 7.6 oz (86.4 kg) 187 lb 6.3 oz (85 kg)    BMI:  Body mass index is 31.18 kg/(m^2).  Estimated Nutritional Needs:  Kcal:  1675-1955kcals, BEE: 1163kcals, TEE: (IF 1.2-1.4)(AF 1.2) using IBW of 56.8kg  Protein:  68-85g protein (1.2-1.5g/kg) using IBW of 56.8kg  Fluid:  UOP+1046mL  Skin:  Reviewed,  no issues  Diet Order:  Diet renal with fluid restriction Fluid restriction:: 1200 mL Fluid; Room service appropriate?: Yes; Fluid consistency:: Thin  EDUCATION NEEDS:  No education needs identified at this time   Intake/Output Summary (Last 24 hours) at 02/23/15 1509 Last data filed at 02/23/15 1409  Gross per 24 hour  Intake    120 ml  Output   1500 ml  Net  -1380 ml    Last BM:  6/3  MODERATE Care Level  Dwyane Luo, RD, LDN Pager [phone]

## 2015-02-23 NOTE — Progress Notes (Signed)
Patient tolerated tx without complications. 1561ml fluid removal. Post weight of 85kg.

## 2015-02-23 NOTE — Plan of Care (Signed)
Problem: Discharge Progression Outcomes Goal: Other Discharge Outcomes/Goals Plan of care progress to goals: 1. C/o abdominal pain which pt thinks to be from drinking contrast earlier in the day. C/o Headache relieved by Tylenol.  2. Hemodynamically: vitals stable, afebrile, NSR on telemetry    3. Complication: n/v relieved by Zofran 4. Pt more active tonight stating she is feeling much better and ready to go home No fall/injury this shift. Will continue to assess.

## 2015-02-23 NOTE — Consult Note (Signed)
Subjective: Patient seen for nausea and vomiting. Patient seen and examined please see full GI consult by Mrs. London.  Objective: Vital signs in last 24 hours: Temp:  [97.5 F (36.4 C)-98.3 F (36.8 C)] 98.3 F (36.8 C) (06/03 1521) Pulse Rate:  [66-80] 75 (06/03 1521) Resp:  [16-20] 17 (06/03 1458) BP: (147-168)/(60-80) 156/60 mmHg (06/03 1521) SpO2:  [94 %-95 %] 94 % (06/03 1521) Weight:  [85 kg (187 lb 6.3 oz)-86.4 kg (190 lb 7.6 oz)] 85 kg (187 lb 6.3 oz) (06/03 1458) Blood pressure 156/60, pulse 75, temperature 98.3 F (36.8 C), temperature source Oral, resp. rate 17, height 5\' 5"  (1.651 m), weight 85 kg (187 lb 6.3 oz), SpO2 94 %.   Intake/Output from previous day: 06/02 0701 - 06/03 0700 In: 120 [P.O.:120] Out: 250 [Urine:250]  Intake/Output this shift:     General appearance:  A 54 year old female no acute distress Resp:  Clear to auscultation Cardio:  Regular rate and rhythm GI:  Soft bowel sounds positive, intermittent. Mild tenderness throughout the upper abdomen. No masses rebound or organomegaly. Extremities:     Lab Results: Results for orders placed or performed during the hospital encounter of 02/19/15 (from the past 24 hour(s))  Glucose, capillary     Status: Abnormal   Collection Time: 02/22/15  9:56 PM  Result Value Ref Range   Glucose-Capillary 200 (H) 65 - 99 mg/dL   Comment 1 Notify RN   CBC     Status: Abnormal   Collection Time: 02/23/15  5:01 AM  Result Value Ref Range   WBC 5.2 3.6 - 11.0 K/uL   RBC 3.97 3.80 - 5.20 MIL/uL   Hemoglobin 10.7 (L) 12.0 - 16.0 g/dL   HCT 34.1 (L) 35.0 - 47.0 %   MCV 85.9 80.0 - 100.0 fL   MCH 27.0 26.0 - 34.0 pg   MCHC 31.4 (L) 32.0 - 36.0 g/dL   RDW 20.6 (H) 11.5 - 14.5 %   Platelets 114 (L) 150 - 440 K/uL  Basic metabolic panel     Status: Abnormal   Collection Time: 02/23/15  5:01 AM  Result Value Ref Range   Sodium 132 (L) 135 - 145 mmol/L   Potassium 4.0 3.5 - 5.1 mmol/L   Chloride 94 (L) 101 - 111  mmol/L   CO2 28 22 - 32 mmol/L   Glucose, Bld 178 (H) 65 - 99 mg/dL   BUN 12 6 - 20 mg/dL   Creatinine, Ser 5.58 (H) 0.44 - 1.00 mg/dL   Calcium 8.6 (L) 8.9 - 10.3 mg/dL   GFR calc non Af Amer 8 (L) >60 mL/min   GFR calc Af Amer 9 (L) >60 mL/min   Anion gap 10 5 - 15  Lipase, blood     Status: Abnormal   Collection Time: 02/23/15  5:01 AM  Result Value Ref Range   Lipase 21 (L) 22 - 51 U/L  Glucose, capillary     Status: Abnormal   Collection Time: 02/23/15  7:17 AM  Result Value Ref Range   Glucose-Capillary 165 (H) 65 - 99 mg/dL   Comment 1 Notify RN    Comment 2 Document in Chart   Glucose, capillary     Status: Abnormal   Collection Time: 02/23/15  4:32 PM  Result Value Ref Range   Glucose-Capillary 108 (H) 65 - 99 mg/dL   Comment 1 Notify RN    Comment 2 Document in Chart       Recent Labs  02/21/15 0731 02/23/15 0501  WBC 7.1 5.2  HGB 11.8* 10.7*  HCT 36.8 34.1*  PLT 141* 114*   BMET  Recent Labs  02/22/15 0526 02/22/15 1139 02/23/15 0501  NA 137 136 132*  K 4.4 4.4 4.0  CL 98* 99* 94*  CO2 31 28 28   GLUCOSE 139* 132* 178*  BUN 8 9 12   CREATININE 4.66* 5.04* 5.58*  CALCIUM 8.3* 8.4* 8.6*   LFT  Recent Labs  02/22/15 1139  PROT 6.7  ALBUMIN 3.2*  AST 38  ALT 15  ALKPHOS 102  BILITOT 1.5*   PT/INR No results for input(s): LABPROT, INR in the last 72 hours. Hepatitis Panel  Recent Labs  02/21/15 1152  HEPBSAG Negative*   C-Diff No results for input(s): CDIFFTOX in the last 72 hours. No results for input(s): CDIFFPCR in the last 72 hours.   Studies/Results: Ct Abdomen Pelvis W Contrast  02/22/2015   CLINICAL DATA:  Generalized abdominal pain, nausea and vomiting, history of pancreatitis  EXAM: CT ABDOMEN AND PELVIS WITH CONTRAST  TECHNIQUE: Multidetector CT imaging of the abdomen and pelvis was performed using the standard protocol following bolus administration of intravenous contrast.  CONTRAST:  173mL OMNIPAQUE IOHEXOL 300 MG/ML   SOLN  COMPARISON:  03/31/2013  FINDINGS: Sagittal images of the spine shows mild degenerative changes lower thoracic spine. There is mild anasarca infiltration of subcutaneous fat abdominal and pelvic wall.  Small right pleural effusion. Trace left pleural effusion. Mild basilar posterior atelectasis. Enhanced liver shows no focal mass. The patient is status postcholecystectomy. The pancreas and spleen is unremarkable. Adrenal glands are unremarkable. Again noted renal atrophy bilaterally. No hydronephrosis or hydroureter. There is global mild decreased enhancement bilateral kidneys. There is delay excretion bilateral kidneys. Findings surgery decrease renal function bilaterally. Clinical correlation is necessary.  No aortic aneurysm.  Small hiatal hernia.  Atherosclerotic calcifications of abdominal aorta and SMA.  Moderate stool noted in hepatic flexure of the colon. There is no pericecal inflammation.  Scattered diverticula are noted descending colon and sigmoid colon. There is minimal thickening of colonic wall in splenic flexure of the colon. Minimal colitis cannot be excluded. Mild thickening of colonic wall in sigmoid colon and rectum. Mild colitis cannot be excluded. There is no mesenteric or pericolonic abscess. No diverticular abscess is noted.  The patient is status post hysterectomy. The patient is status post appendectomy. A right inguinal lymph node measures 2.2 x 1.5 cm. Left inguinal lymph node measures 2.3 by 1.3 cm.  IMPRESSION: 1. There is small right pleural effusion. Trace left pleural effusion. Mild bilateral basilar posterior atelectasis. 2. Status postcholecystectomy. 3. Again noted bilateral renal atrophy. No hydronephrosis or hydroureter. 4. Minimal thickening of colonic wall in splenic flexure of the colon. Mild thickening of colonic wall in sigmoid colon and rectum. Mild colitis cannot be excluded. Scattered diverticula are noted in descending colon and sigmoid colon. No evidence of acute  diverticulitis. No pericolonic abscess. No diverticular abscess. 5. Status post appendectomy.  No pericecal inflammation. 6. Status post hysterectomy. 7. Bilateral decrease parenchymal enhancement and excretion of the kidneys suggesting bilateral decreased renal function. Clinical correlation is necessary   Electronically Signed   By: Lahoma Crocker M.D.   On: 02/22/2015 16:58    Scheduled Inpatient Medications:   . amLODipine  10 mg Oral Daily  . aspirin EC  81 mg Oral Daily  . atorvastatin  40 mg Oral Daily  . buPROPion  150 mg Oral BID  . carvedilol  25  mg Oral BID  . cholecalciferol  1,000 Units Oral Daily  . citalopram  20 mg Oral Daily  . clopidogrel  75 mg Oral Daily  . docusate sodium  100 mg Oral BID  . feeding supplement (NEPRO CARB STEADY)  237 mL Oral Daily  . gabapentin  100 mg Oral Daily  . heparin  5,000 Units Subcutaneous 3 times per day  . levofloxacin (LEVAQUIN) IV  500 mg Intravenous Q48H  . lipase/protease/amylase  24,000-48,000 Units Oral 5 X Daily  . omega-3 acid ethyl esters  1 g Oral Daily  . ondansetron (ZOFRAN) IV  4 mg Intravenous 4 times per day  . pantoprazole  40 mg Oral BID  . ranolazine  500 mg Oral BID  . sevelamer carbonate  800 mg Oral TID WC  . sucralfate  1 g Oral TID WC & HS    Continuous Inpatient Infusions:     PRN Inpatient Medications:  sodium chloride, sodium chloride, acetaminophen **OR** acetaminophen, albuterol, heparin, HYDROcodone-acetaminophen, hydrocortisone, HYDROmorphone (DILAUDID) injection, lidocaine (PF), lidocaine-prilocaine, metoCLOPramide, nitroGLYCERIN, pentafluoroprop-tetrafluoroeth, polyethylene glycol, promethazine  Miscellaneous:   Assessment:  1.Nausea vomiting and dyspepsia in the setting of known history of diabetic gastroparesis, gaseous esophageal reflux, CKD. She has also been using NSAIDs recently. She has shown gastritis on previous endoscopy. There is some reported hematemesis though small volume. Plan:  1.  Agree with scheduled antiemetics for 24-36 hours. 2. Recommend EGD when clinically feasible. sHe is currently on Plavix. This will need to be held for at least 5 days prior to procedure. Would recommend cardiology clearance in regards to holding the Plavix and procedure. I have discussed the risks benefits and complications of procedures to include not limited to bleeding, infection, perforation and the risk of sedation and the patient wishes to proceed.  Lollie Sails MD 02/23/2015, 9:16 PM

## 2015-02-23 NOTE — Progress Notes (Signed)
[facility]  ROUNDING NOTE   Subjective:  Patient seen during dialysis. Still having some nausea and vomiting.  Objective:  Vital signs in last 24 hours:  Temp:  [97.5 F (36.4 C)-98.2 F (36.8 C)] 97.5 F (36.4 C) (06/03 1030) Pulse Rate:  [64-69] 66 (06/03 1100) Resp:  [18-20] 20 (06/03 1100) BP: (143-158)/(60-62) 150/60 mmHg (06/03 0520) SpO2:  [94 %-96 %] 95 % (06/03 0520) Weight:  [86.4 kg (190 lb 7.6 oz)] 86.4 kg (190 lb 7.6 oz) (06/03 1019)  Weight change:  Filed Weights   02/21/15 1013 02/21/15 1511 02/23/15 1019  Weight: 89.8 kg (197 lb 15.6 oz) 86.4 kg (190 lb 7.6 oz) 86.4 kg (190 lb 7.6 oz)    Intake/Output: I/O last 3 completed shifts: In: 120 [P.O.:120] Out: 250 [Urine:250]   Intake/Output this shift:     Physical Exam: General: NAD  Head: Normocephalic, atraumatic. Moist oral mucosal membranes  Eyes: Anicteric  Neck: Supple, trachea midline  Lungs:  Diminished BS at bases, normal effort  Heart: Regular rate and rhythm S1S2  Abdomen:  Soft, nontender,   Extremities:  1+ peripheral edema.  Neurologic: Awake, alert, follows commands  Skin: No rashes  Access: LUE AV access    Basic Metabolic Panel:  Recent Labs Lab 02/17/15 1955  02/20/15 0036 02/21/15 0707 02/21/15 1152 02/22/15 0526 02/22/15 1139 02/23/15 0501  NA 134*  < > 136 137  --  137 136 132*  K 4.0  < > 3.7 4.8  --  4.4 4.4 4.0  CL 95*  < > 96* 98*  --  98* 99* 94*  CO2 28  < > 30 25  --  31 28 28   GLUCOSE 123*  < > 58* 41*  --  139* 132* 178*  BUN 23*  < > 14 18  --  8 9 12   CREATININE 7.72*  < > 5.90* 7.97*  --  4.66* 5.04* 5.58*  CALCIUM 9.0  < > 8.5* 8.8*  --  8.3* 8.4* 8.6*  MG 2.6*  --   --   --   --   --   --   --   PHOS  --   --   --  3.5 3.1 2.5  --   --   < > = values in this interval not displayed.  Liver Function Tests:  Recent Labs Lab 02/17/15 1955 02/20/15 0035 02/21/15 0707 02/22/15 0526 02/22/15 1139  AST 20 22  --   --  38  ALT 16 16   --   --  15  ALKPHOS 124 102  --   --  102  BILITOT 0.6 0.4  --   --  1.5*  PROT 7.4 6.7  --   --  6.7  ALBUMIN 4.0 3.5 3.3* 2.9* 3.2*    Recent Labs Lab 02/17/15 1955 02/20/15 0035 02/21/15 1152 02/22/15 1139  LIPASE 28 20*  --  20*  AMYLASE  --   --  23*  --    No results for input(s): AMMONIA in the last 168 hours.  CBC:  Recent Labs Lab 02/17/15 1955 02/19/15 1133 02/20/15 0036 02/21/15 0731 02/23/15 0501  WBC 5.6 5.3 4.5 7.1 5.2  NEUTROABS 2.9  --   --   --   --   HGB 11.9* 11.4* 11.1* 11.8* 10.7*  HCT 37.2 35.8 35.3 36.8 34.1*  MCV 85.8 85.4 86.2 86.4 85.9  PLT 135* 141* 130* 141* 114*    Cardiac Enzymes:  Recent Labs Lab 02/19/15 1133 02/19/15 1814 02/20/15 0034  TROPONINI <0.03 <0.03 <0.03    BNP: Invalid input(s): POCBNP  CBG:  Recent Labs Lab 02/22/15 0719 02/22/15 1106 02/22/15 1611 02/22/15 2156 02/23/15 0717  GLUCAP 137* 114* 196* 200* 165*    Microbiology: Results for orders placed or performed during the hospital encounter of 02/19/15  Blood culture (routine x 2)     Status: None (Preliminary result)   Collection Time: 02/19/15 12:20 PM  Result Value Ref Range Status   Specimen Description BLOOD  Final   Special Requests Immunocompromised  Final   Culture NO GROWTH 3 DAYS  Final   Report Status PENDING  Incomplete  Blood culture (routine x 2)     Status: None (Preliminary result)   Collection Time: 02/19/15 12:30 PM  Result Value Ref Range Status   Specimen Description BLOOD  Final   Special Requests Immunocompromised  Final   Culture NO GROWTH 3 DAYS  Final   Report Status PENDING  Incomplete  Body fluid culture     Status: None (Preliminary result)   Collection Time: 02/20/15  2:55 PM  Result Value Ref Range Status   Specimen Description PLEURAL  Final   Special Requests NONE  Final   Gram Stain   Final    FEW WBC SEEN RARE RED BLOOD CELLS FEW GRAM VARIABLE ROD    Culture NO GROWTH 3 DAYS  Final   Report Status  PENDING  Incomplete  Body fluid culture     Status: None (Preliminary result)   Collection Time: 02/21/15  4:30 PM  Result Value Ref Range Status   Specimen Description PLEURAL  Final   Special Requests NONE  Final   Gram Stain PENDING  Incomplete   Culture NO GROWTH 1 DAY  Final   Report Status PENDING  Incomplete    Coagulation Studies: No results for input(s): LABPROT, INR in the last 72 hours.  Urinalysis: No results for input(s): COLORURINE, LABSPEC, PHURINE, GLUCOSEU, HGBUR, BILIRUBINUR, KETONESUR, PROTEINUR, UROBILINOGEN, NITRITE, LEUKOCYTESUR in the last 72 hours.  Invalid input(s): APPERANCEUR    Imaging: Dg Chest 1 View  02/21/2015   CLINICAL DATA:  Thoracentesis  EXAM: CHEST  1 VIEW  COMPARISON:  02/20/2015  FINDINGS: There is no evidence of pneumothorax. Cardiomegaly is stable. Bibasilar opacity is stable.  IMPRESSION: No pneumothorax post thoracentesis.  Persistent bibasilar opacity.   Electronically Signed   By: Marybelle Killings M.D.   On: 02/21/2015 17:25   Ct Abdomen Pelvis W Contrast  02/22/2015   CLINICAL DATA:  Generalized abdominal pain, nausea and vomiting, history of pancreatitis  EXAM: CT ABDOMEN AND PELVIS WITH CONTRAST  TECHNIQUE: Multidetector CT imaging of the abdomen and pelvis was performed using the standard protocol following bolus administration of intravenous contrast.  CONTRAST:  150mL OMNIPAQUE IOHEXOL 300 MG/ML  SOLN  COMPARISON:  03/31/2013  FINDINGS: Sagittal images of the spine shows mild degenerative changes lower thoracic spine. There is mild anasarca infiltration of subcutaneous fat abdominal and pelvic wall.  Small right pleural effusion. Trace left pleural effusion. Mild basilar posterior atelectasis. Enhanced liver shows no focal mass. The patient is status postcholecystectomy. The pancreas and spleen is unremarkable. Adrenal glands are unremarkable. Again noted renal atrophy bilaterally. No hydronephrosis or hydroureter. There is global mild decreased  enhancement bilateral kidneys. There is delay excretion bilateral kidneys. Findings surgery decrease renal function bilaterally. Clinical correlation is necessary.  No aortic aneurysm.  Small hiatal hernia.  Atherosclerotic calcifications of abdominal aorta  and SMA.  Moderate stool noted in hepatic flexure of the colon. There is no pericecal inflammation.  Scattered diverticula are noted descending colon and sigmoid colon. There is minimal thickening of colonic wall in splenic flexure of the colon. Minimal colitis cannot be excluded. Mild thickening of colonic wall in sigmoid colon and rectum. Mild colitis cannot be excluded. There is no mesenteric or pericolonic abscess. No diverticular abscess is noted.  The patient is status post hysterectomy. The patient is status post appendectomy. A right inguinal lymph node measures 2.2 x 1.5 cm. Left inguinal lymph node measures 2.3 by 1.3 cm.  IMPRESSION: 1. There is small right pleural effusion. Trace left pleural effusion. Mild bilateral basilar posterior atelectasis. 2. Status postcholecystectomy. 3. Again noted bilateral renal atrophy. No hydronephrosis or hydroureter. 4. Minimal thickening of colonic wall in splenic flexure of the colon. Mild thickening of colonic wall in sigmoid colon and rectum. Mild colitis cannot be excluded. Scattered diverticula are noted in descending colon and sigmoid colon. No evidence of acute diverticulitis. No pericolonic abscess. No diverticular abscess. 5. Status post appendectomy.  No pericecal inflammation. 6. Status post hysterectomy. 7. Bilateral decrease parenchymal enhancement and excretion of the kidneys suggesting bilateral decreased renal function. Clinical correlation is necessary   Electronically Signed   By: Lahoma Crocker M.D.   On: 02/22/2015 16:58   US Abdomen Limited  02/21/2015   CLINICAL DATA:  Ascites.  EXAM: LIMITED ABDOMEN ULTRASOUND FOR ASCITES  TECHNIQUE: Limited ultrasound survey for ascites was performed in all four  abdominal quadrants.  COMPARISON:  02/21/2015.  FINDINGS: There is no ascites identified in all 4 quadrants evaluated.  IMPRESSION: Negative for ascites.   Electronically Signed   By: Dereck Ligas M.D.   On: 02/21/2015 18:17   US Thoracentesis Asp Pleural Space W/img Guide  02/21/2015   CLINICAL DATA:  Shortness of breath.  EXAM: ULTRASOUND GUIDED LEFT THORACENTESIS  COMPARISON:  Chest radiograph 02/20/2015  PROCEDURE: An ultrasound guided thoracentesis was thoroughly discussed with the patient and questions answered. The benefits, risks, alternatives and complications were also discussed. The patient understands and wishes to proceed with the procedure. Written consent was obtained.  Ultrasound was performed to localize and mark an adequate pocket of fluid in the left posterior chest. The area was then prepped and draped in the normal sterile fashion. 1% Lidocaine was used for local anesthesia. Under ultrasound guidance a 19 gauge Yueh catheter was introduced. Thoracentesis was performed. The catheter was removed and a dressing applied.  COMPLICATIONS: None.  FINDINGS: A total of approximately 400 mL of amber color fluid was removed. A fluid sample was sent for laboratory analysis.  IMPRESSION: Successful ultrasound guided left thoracentesis yielding 400 mL of pleural fluid.   Electronically Signed   By: Markus Daft M.D.   On: 02/21/2015 17:13     Medications:     . amLODipine  10 mg Oral Daily  . aspirin EC  81 mg Oral Daily  . atorvastatin  40 mg Oral Daily  . buPROPion  150 mg Oral BID  . carvedilol  25 mg Oral BID  . cholecalciferol  1,000 Units Oral Daily  . citalopram  20 mg Oral Daily  . clopidogrel  75 mg Oral Daily  . feeding supplement (NEPRO CARB STEADY)  237 mL Oral Daily  . gabapentin  100 mg Oral Daily  . heparin  5,000 Units Subcutaneous 3 times per day  . levofloxacin (LEVAQUIN) IV  500 mg Intravenous Q48H  . lipase/protease/amylase  24,000-48,000 Units Oral 5 X Daily  .  omega-3 acid ethyl esters  1 g Oral Daily  . pantoprazole  40 mg Oral BID  . ranolazine  500 mg Oral BID  . sevelamer carbonate  800 mg Oral TID WC  . sucralfate  1 g Oral TID WC & HS   sodium chloride, sodium chloride, acetaminophen **OR** acetaminophen, albuterol, heparin, HYDROcodone-acetaminophen, hydrocortisone, HYDROmorphone (DILAUDID) injection, lidocaine (PF), lidocaine-prilocaine, metoCLOPramide, nitroGLYCERIN, ondansetron (ZOFRAN) IV, pentafluoroprop-tetrafluoroeth, polyethylene glycol, promethazine  Assessment/ Plan:  54 yo AAF with h/o ESRD, DM (30 yrs with retinopathy, neuropathy), OA, IBS, hyperlipidemia, h/o GI bleed, SHPTH, AOCD, diastolic heart failure , RCA stent Feb 2013, C. diff colitis 7/14, clotted RUE AVF 10/14, admission for shortness of breath 02/20/15.  1.  ESRD on HD MWF: Pt seen during HD, tolerating well, complete HD today, next HD on Monday. 2.  Shortness of breath/bilateral pleural effusions:  S/p right and left thoracentesis.  Breathing improved.   3.  Anemia of CKD: hgb 10.7, consider restarting epogen next week or as an outpt. 4.  SHPTH of renal origin:  Phos well controlled, continue to monitor periodically, continue renvela.    LOS: 4 [name] 6/3/201611:24 AM

## 2015-02-23 NOTE — Progress Notes (Signed)
ANTIBIOTIC CONSULT NOTE - FOLLOW UP  Pharmacy Consult for Levaquin Indication: HCAP  Allergies  Allergen Reactions  . Ace Inhibitors Swelling  . Codeine Nausea And Vomiting  . Compazine [Prochlorperazine Edisylate] Nausea And Vomiting  . Oxycodone Anxiety  . Tape Rash    Patient Measurements: Height: 5\' 5"  (165.1 cm) Weight: 190 lb 7.6 oz (86.4 kg) IBW/kg (Calculated) : 57   Vital Signs: Temp: 97.5 F (36.4 C) (06/03 1030) Temp Source: Oral (06/03 1030) BP: 165/69 mmHg (06/03 1330) Pulse Rate: 76 (06/03 1330) Intake/Output from previous day: 06/02 0701 - 06/03 0700 In: 120 [P.O.:120] Out: 250 [Urine:250] Intake/Output from this shift:    Labs:  Recent Labs  02/21/15 0731 02/22/15 0526 02/22/15 1139 02/23/15 0501  WBC 7.1  --   --  5.2  HGB 11.8*  --   --  10.7*  PLT 141*  --   --  114*  CREATININE  --  4.66* 5.04* 5.58*   Estimated Creatinine Clearance: 12.4 mL/min (by C-G formula based on Cr of 5.58). No results for input(s): VANCOTROUGH, VANCOPEAK, VANCORANDOM, GENTTROUGH, GENTPEAK, GENTRANDOM, TOBRATROUGH, TOBRAPEAK, TOBRARND, AMIKACINPEAK, AMIKACINTROU, AMIKACIN in the last 72 hours.   Microbiology: Recent Results (from the past 720 hour(s))  Blood culture (routine x 2)     Status: None (Preliminary result)   Collection Time: 02/19/15 12:20 PM  Result Value Ref Range Status   Specimen Description BLOOD  Final   Special Requests Immunocompromised  Final   Culture NO GROWTH 4 DAYS  Final   Report Status PENDING  Incomplete  Blood culture (routine x 2)     Status: None (Preliminary result)   Collection Time: 02/19/15 12:30 PM  Result Value Ref Range Status   Specimen Description BLOOD  Final   Special Requests Immunocompromised  Final   Culture NO GROWTH 4 DAYS  Final   Report Status PENDING  Incomplete  Body fluid culture     Status: None (Preliminary result)   Collection Time: 02/20/15  2:55 PM  Result Value Ref Range Status   Specimen  Description PLEURAL  Final   Special Requests NONE  Final   Gram Stain   Final    FEW WBC SEEN RARE RED BLOOD CELLS FEW GRAM VARIABLE ROD    Culture NO GROWTH 3 DAYS  Final   Report Status PENDING  Incomplete  Body fluid culture     Status: None (Preliminary result)   Collection Time: 02/21/15  4:30 PM  Result Value Ref Range Status   Specimen Description PLEURAL  Final   Special Requests NONE  Final   Gram Stain PENDING  Incomplete   Culture NO GROWTH 1 DAY  Final   Report Status PENDING  Incomplete    Anti-infectives    Start     Dose/Rate Route Frequency Ordered Stop   02/21/15 1800  levofloxacin (LEVAQUIN) IVPB 500 mg     500 mg 100 mL/hr over 60 Minutes Intravenous Every 48 hours 02/19/15 1959     02/19/15 1400  levofloxacin (LEVAQUIN) IVPB 750 mg     750 mg 100 mL/hr over 90 Minutes Intravenous  Once 02/19/15 1359 02/19/15 1920   02/19/15 1330  vancomycin (VANCOCIN) IVPB 1000 mg/200 mL premix     1,000 mg 200 mL/hr over 60 Minutes Intravenous  Once 02/19/15 1318 02/19/15 1423   02/19/15 1330  piperacillin-tazobactam (ZOSYN) IVPB 3.375 g     3.375 g 12.5 mL/hr over 240 Minutes Intravenous  Once 02/19/15 1318 02/19/15 1905  Assessment: Patient with ESRD on HD on Levaquin for HCAP. Patient c/o abdominal pain along with N/V and cannot take anything by mouth.   Plan:  Continue Levaquin 500 mg iv q 48 h.  Ulice Dash D 02/23/2015,2:05 PM

## 2015-02-23 NOTE — Plan of Care (Signed)
Problem: Discharge Progression Outcomes Goal: Discharge plan in place and appropriate Individualization  Outcome: Progressing Pt prefers to be called [name]. Patient receives hemodialysis MWF. Patient is a high falls risk, toileting offered every hour. Bed alarm on. Patient has history of ESRD, HTN, DM, Gerd, CAD, Collagen Vascular disease, MI, diabetic neuropathy, controlled with home medications. Goal: Other Discharge Outcomes/Goals Outcome: Progressing Plan of Care Progressing to Goal: Patient received pain and nausea meds throughout day with noted relief. Dialysis performed with complications. Tolerating diet better.

## 2015-02-23 NOTE — Progress Notes (Signed)
[place] [facility] [place] NAME: [name]    MR#:  [id number]  DATE OF BIRTH:  1961/06/06  SUBJECTIVE:  CHIEF COMPLAINT:   Chief Complaint  Patient presents with  . Shortness of Breath   Patient is complaining of abdominal pain along with nausea and vomiting. She is unable to take any by mouth intake. She is also states that she had 3 dark-colored stools yesterday.    Review of Systems  Constitutional: Negative for fever.  Respiratory: Negative for cough, hemoptysis and sputum production.   Cardiovascular: Negative for chest pain, palpitations and orthopnea.  Gastrointestinal: Positive for nausea, vomiting, abdominal pain and melena.  Neurological: Negative for dizziness, tingling and headaches.    VITAL SIGNS: Blood pressure 162/64, pulse 76, temperature 97.5 F (36.4 C), temperature source Oral, resp. rate 20, height 5\' 5"  (1.651 m), weight 86.4 kg (190 lb 7.6 oz), SpO2 95 %.  PHYSICAL EXAMINATION:   GENERAL:  54 y.o.-year-old patient lying in the bed with no acute distress.  EYES: Pupils equal, round, reactive to light and accommodation. No scleral icterus. Extraocular muscles intact.  HEENT: Head atraumatic, normocephalic. Oropharynx and nasopharynx clear.  NECK:  Supple, no jugular venous distention. No thyroid enlargement, no tenderness.  LUNGS: Bibasilar crackles, fair air movement, no respiratory distress, no wheezes or coughing  CARDIOVASCULAR: S1, S2 normal. No murmurs, rubs, or gallops.  ABDOMEN: Soft, tenderness in the mid epigastric area, no guarding no rebound EXTREMITIES: No pedal edema, cyanosis, or clubbing.  NEUROLOGIC: Cranial nerves II through XII are intact Gait not checked.  PSYCHIATRIC: The patient is alert and oriented x 3.  SKIN: No obvious rash, lesion, or ulcer.   ORDERS/RESULTS REVIEWED:   CBC  Recent Labs Lab 02/17/15 1955 02/19/15 1133 02/20/15 0036 02/21/15 0731 02/23/15 0501  WBC 5.6 5.3  4.5 7.1 5.2  HGB 11.9* 11.4* 11.1* 11.8* 10.7*  HCT 37.2 35.8 35.3 36.8 34.1*  PLT 135* 141* 130* 141* 114*  MCV 85.8 85.4 86.2 86.4 85.9  MCH 27.5 27.2 27.1 27.8 27.0  MCHC 32.0 31.8* 31.4* 32.2 31.4*  RDW 19.2* 19.5* 19.5* 20.2* 20.6*  LYMPHSABS 1.8  --   --   --   --   MONOABS 0.5  --   --   --   --   EOSABS 0.3  --   --   --   --   BASOSABS 0.1  --   --   --   --    ------------------------------------------------------------------------------------------------------------------  Chemistries   Recent Labs Lab 02/17/15 1955  02/20/15 0035 02/20/15 0036 02/21/15 0707 02/22/15 0526 02/22/15 1139 02/23/15 0501  NA 134*  < >  --  136 137 137 136 132*  K 4.0  < >  --  3.7 4.8 4.4 4.4 4.0  CL 95*  < >  --  96* 98* 98* 99* 94*  CO2 28  < >  --  30 25 31 28 28   GLUCOSE 123*  < >  --  58* 41* 139* 132* 178*  BUN 23*  < >  --  14 18 8 9 12   CREATININE 7.72*  < >  --  5.90* 7.97* 4.66* 5.04* 5.58*  CALCIUM 9.0  < >  --  8.5* 8.8* 8.3* 8.4* 8.6*  MG 2.6*  --   --   --   --   --   --   --   AST 20  --  22  --   --   --  38  --   ALT 16  --  16  --   --   --  15  --   ALKPHOS 124  --  102  --   --   --  102  --   BILITOT 0.6  --  0.4  --   --   --  1.5*  --   < > = values in this interval not displayed. ------------------------------------------------------------------------------------------------------------------ estimated creatinine clearance is 12.4 mL/min (by C-G formula based on Cr of 5.58). ------------------------------------------------------------------------------------------------------------------ No results for input(s): TSH, T4TOTAL, T3FREE, THYROIDAB in the last 72 hours.  Invalid input(s): FREET3  Cardiac Enzymes  Recent Labs Lab 02/19/15 1133 02/19/15 1814 02/20/15 0034  TROPONINI <0.03 <0.03 <0.03   ------------------------------------------------------------------------------------------------------------------ Invalid input(s):  POCBNP ---------------------------------------------------------------------------------------------------------------  RADIOLOGY: Dg Chest 1 View  02/21/2015   CLINICAL DATA:  Thoracentesis  EXAM: CHEST  1 VIEW  COMPARISON:  02/20/2015  FINDINGS: There is no evidence of pneumothorax. Cardiomegaly is stable. Bibasilar opacity is stable.  IMPRESSION: No pneumothorax post thoracentesis.  Persistent bibasilar opacity.   Electronically Signed   By: Marybelle Killings M.D.   On: 02/21/2015 17:25   Ct Abdomen Pelvis W Contrast  02/22/2015     IMPRESSION: 1. There is small right pleural effusion. Trace left pleural effusion. Mild bilateral basilar posterior atelectasis. 2. Status postcholecystectomy. 3. Again noted bilateral renal atrophy. No hydronephrosis or hydroureter. 4. Minimal thickening of colonic wall in splenic flexure of the colon. Mild thickening of colonic wall in sigmoid colon and rectum. Mild colitis cannot be excluded. Scattered diverticula are noted in descending colon and sigmoid colon. No evidence of acute diverticulitis. No pericolonic abscess. No diverticular abscess. 5. Status post appendectomy.  No pericecal inflammation. 6. Status post hysterectomy. 7. Bilateral decrease parenchymal enhancement and excretion of the kidneys suggesting bilateral decreased renal function. Clinical correlation is necessary   Electronically Signed   By: Lahoma Crocker M.D.   On: 02/22/2015 16:58   US Abdomen Limited  02/21/2015   CLINICAL DATA:  Ascites.  EXAM: LIMITED ABDOMEN ULTRASOUND FOR ASCITES  TECHNIQUE: Limited ultrasound survey for ascites was performed in all four abdominal quadrants.  COMPARISON:  02/21/2015.  FINDINGS: There is no ascites identified in all 4 quadrants evaluated.  IMPRESSION: Negative for ascites.   Electronically Signed   By: Dereck Ligas M.D.   On: 02/21/2015 18:17   US Thoracentesis Asp Pleural Space W/img Guide  02/21/2015   IMPRESSION: Successful ultrasound guided left thoracentesis  yielding 400 mL of pleural fluid.   Electronically Signed   By: Markus Daft M.D.   On: 02/21/2015 17:13    EKG:  Orders placed or performed during the hospital encounter of 02/19/15  . ED EKG  . ED EKG    ASSESSMENT AND PLAN:  Active Problems:   Pleural effusion   HCAP (healthcare-associated pneumonia)   End-stage renal disease on hemodialysis  Pleural effusion  HCAP (healthcare-associated pneumonia)  End-stage renal disease on hemodialysis  #1 healthcare associated pneumonia:  -Continue Levaquin - Blood cultures negative, pleural effusion with gram variable rods need to follow up on final readings - Appreciate pulmonology consultation - Status post right sided paracentesis on 5/31 and left sided thoracentesis on 6/1, breathing much better  #2 end-stage renal disease on hemodialysis:  - Nephrology following, dialysis Monday Wednesday Friday  #3 nausea and vomiting with abdominal pain:  --Her CT scan of the abdomen and pelvis did not show any acute E etiology. GI consultation has been placed.  Patient will likely need to undergo EGD given her history for the past 2 days of melanotic. Her hemoglobin remains relatively stable. Patient will continue with PPI and Carafate. I do not suspect her abdominal pain with nausea and vomiting is related to gastroparesis. She can also have pancreatitis, I will check a lipase level.   #4 coronary artery disease with history of MI:  - Cardiac enzymes negative. No events on telemetry. No chest pain -Continue Plavix, aspirin, beta blocker -Decreased dose of Ranexa on pharmacy recommendation as it is renally excreted she has a prolonging QT  #5 hypoglycemia: - Discontinue Levemir. Liberalize diet. Continue with Reglan and Zofran  #6 hypertension controlled on amlodipine, carvedilol  DRUG ALLERGIES:  Allergies  Allergen Reactions  . Ace Inhibitors Swelling  . Codeine Nausea And Vomiting  . Compazine [Prochlorperazine Edisylate] Nausea And  Vomiting  . Oxycodone Anxiety  . Tape Rash    CODE STATUS:     Code Status Orders        Start     Ordered   02/19/15 1615  Full code   Continuous     02/19/15 1614      TOTAL TIME TAKING CARE OF THIS PATIENT: 25  minutes.    Dwanna Goshert M.D on 02/23/2015 at 1:27 PM  Between 7am to 6pm - Pager - [phone]  After 6pm go to [email or web address] - password [email or web address] [facility]  Office  [phone]  CC: Primary care physician; Ellamae Sia, MD

## 2015-02-23 NOTE — H&P (Signed)
Consultation  Referring Provider:     Dr Volanda Napoleon Admit date: 02/19/15 Consult date   : 02/23/15      Reason for Consultation: NV             HPI:   [name] is a 54 y.o. female  With history of ESRD on dialysis 3 times weekly; DM, MI/CAD with stent placement some years ago, pancreatitis, EPI, GERD,gastroparesis, who was admitted with pneumonia- underwent thoracentesis x 2 with improvement to resp status and is receiving Levaquin for same. Gi being asked to eval NV. Patient reports taking some Ibuprofen about 10d ago for a headache and family members with uri/vomiting that visited over the weekend. Had hamburgers Fri- has onset of epigastric pain, NV Sunday. States she vomited food stuffs and hamburger. Felt better in her abdomen Monday. Developed epigastric pain, NV on Tuesday that has persisted intermittently through today. States she believes there has been some blood in her vomit. HGB has been stable.  Antiemetics somewhat helpful. Associated symptoms of hotflashes. Moving triggers nausea. Foods tend to aggravate. States she tries to avoid greasy foods.  Reports also irregular bowel habits: does have Creon to take for EPI- however has been doing this irregularly. Does state this has been beneficial for her, however doesn't always control her diarrhea- particularly if she eats at a restaurant- has not seen a nutritionist for this yet. Denies melena /hematochezia, unplanned loss of weight, problems swallowing, further GI complaints. Has been on Nexium qd and Carafate bid for her GERD. Had CT abdomen/pelvis yesterday with mild anasarca to the subq tissues, atrophic kidneys, thoracic lumbar degeneration, some atherolsclerosis,diverticula, bilateral pleural effusions. Last dialysis today.  PREVIOUS ENDOSCOPIES:            Last colonoscopy 8/14 by Dr Gwenlyn Perking with an adenomatous polyp and negative random colon biopsies Last EGD 2010 with gastritis- there was no H pylori, dysplasia, malignancy, or Barretts.   Had gastric empyting test 2011 with delayed gastric emptying.   Past Medical History  Diagnosis Date  . Asthma   . Collagen vascular disease   . Diabetes mellitus without complication   . Hypertension   . Renal insufficiency dialysis     3 days weekly mon wed friday  . Coronary artery disease     stent  . Diabetic neuropathy   . ESRD (end stage renal disease) on dialysis     M-W-F  . GERD (gastroesophageal reflux disease)   . Hx of pancreatitis 2015  . Myocardial infarction   . Pneumonia     Past Surgical History  Procedure Laterality Date  . Cholecystectomy    . Appendectomy    . Abdominal hysterectomy      Family History  Problem Relation Age of Onset  . Kidney disease Mother   . Diabetes Mother      History  Substance Use Topics  . Smoking status: Former Smoker -- 0.50 packs/day    Types: Cigarettes    Quit date: 02/13/2015  . Smokeless tobacco: Never Used  . Alcohol Use: No    Prior to Admission medications   Medication Sig Start Date End Date Taking? Authorizing Provider  albuterol (PROVENTIL HFA;VENTOLIN HFA) 108 (90 BASE) MCG/ACT inhaler Inhale 4-6 puffs by mouth every 4 hours as needed for wheezing, cough, and/or shortness of breath 02/17/15  Yes Hinda Kehr, MD  amLODipine (NORVASC) 10 MG tablet Take 10 mg by mouth daily.   Yes Historical Provider, MD  aspirin EC 81 MG tablet Take 81  mg by mouth daily.   Yes Historical Provider, MD  atorvastatin (LIPITOR) 40 MG tablet Take 40 mg by mouth daily.   Yes Historical Provider, MD  buPROPion (WELLBUTRIN SR) 150 MG 12 hr tablet Take 150 mg by mouth 2 (two) times daily.   Yes Historical Provider, MD  carvedilol (COREG) 25 MG tablet Take 25 mg by mouth 2 (two) times daily.   Yes Historical Provider, MD  cetirizine (ZYRTEC) 10 MG tablet Take 10 mg by mouth daily.   Yes Historical Provider, MD  citalopram (CELEXA) 20 MG tablet Take 20 mg by mouth daily.   Yes Historical Provider, MD  clopidogrel (PLAVIX) 75 MG  tablet Take 75 mg by mouth daily.   Yes Historical Provider, MD  Ergocalciferol (VITAMIN D2) 400 UNITS TABS Take 2 tablets by mouth daily.    Yes Historical Provider, MD  gabapentin (NEURONTIN) 100 MG capsule Take 100 mg by mouth daily.   Yes Historical Provider, MD  HYDROcodone-acetaminophen (NORCO/VICODIN) 5-325 MG per tablet Take 1 tablet by mouth every 6 (six) hours as needed for moderate pain. 02/08/15  Yes Earleen Newport, MD  hydrocortisone 1 % ointment Apply 1 application topically 2 (two) times daily.   Yes Historical Provider, MD  insulin aspart (NOVOLOG) 100 UNIT/ML injection Inject 2-6 Units into the skin 3 (three) times daily with meals. Pt uses as needed per sliding scale.   Yes Historical Provider, MD  insulin detemir (LEVEMIR) 100 UNIT/ML injection Inject 8 Units into the skin at bedtime.   Yes Historical Provider, MD  lipase/protease/amylase (CREON) 12000 UNITS CPEP capsule Take 24,000-48,000 Units by mouth 5 (five) times daily. Pt takes four capsules with meals and two capsules with snacks.   Yes Historical Provider, MD  metoCLOPramide (REGLAN) 5 MG tablet Take 5 mg by mouth 4 (four) times daily as needed for nausea.   Yes Historical Provider, MD  nitroGLYCERIN (NITROSTAT) 0.4 MG SL tablet Place 0.4 mg under the tongue every 5 (five) minutes as needed for chest pain.   Yes Historical Provider, MD  Omega-3 Fatty Acids (FISH OIL) 1000 MG CAPS Take 1 capsule by mouth 2 (two) times daily.   Yes Historical Provider, MD  ondansetron (ZOFRAN) 4 MG tablet Take 1 tablet (4 mg total) by mouth daily as needed for nausea or vomiting. 02/08/15  Yes Earleen Newport, MD  ranitidine (ZANTAC) 150 MG tablet Take 150 mg by mouth 2 (two) times daily.   Yes Historical Provider, MD  ranolazine (RANEXA) 1000 MG SR tablet Take 1,000 mg by mouth 2 (two) times daily.    Yes Historical Provider, MD  sevelamer carbonate (RENVELA) 800 MG tablet Take 800 mg by mouth 3 (three) times daily with meals.   Yes  Historical Provider, MD  levofloxacin (LEVAQUIN) 500 MG tablet Take 1 tablet by mouth every 48 hours starting on 02/19/2015 02/19/15 03/01/15  Hinda Kehr, MD    Current Facility-Administered Medications  Medication Dose Route Frequency Provider Last Rate Last Dose  . 0.9 %  sodium chloride infusion  100 mL Intravenous PRN Munsoor Lateef, MD      . 0.9 %  sodium chloride infusion  100 mL Intravenous PRN Munsoor Lateef, MD      . acetaminophen (TYLENOL) tablet 650 mg  650 mg Oral Q6H PRN Aldean Jewett, MD   650 mg at 02/22/15 2318   Or  . acetaminophen (TYLENOL) suppository 650 mg  650 mg Rectal Q6H PRN Aldean Jewett, MD      .  albuterol (PROVENTIL) (2.5 MG/3ML) 0.083% nebulizer solution 3 mL  3 mL Inhalation Q6H PRN Theodoro Grist, MD      . amLODipine (NORVASC) tablet 10 mg  10 mg Oral Daily Aldean Jewett, MD   10 mg at 02/23/15 1520  . aspirin EC tablet 81 mg  81 mg Oral Daily Theodoro Grist, MD   81 mg at 02/23/15 1520  . atorvastatin (LIPITOR) tablet 40 mg  40 mg Oral Daily Theodoro Grist, MD   40 mg at 02/22/15 1111  . buPROPion (WELLBUTRIN SR) 12 hr tablet 150 mg  150 mg Oral BID Theodoro Grist, MD   150 mg at 02/22/15 2101  . carvedilol (COREG) tablet 25 mg  25 mg Oral BID Aldean Jewett, MD   25 mg at 02/23/15 1521  . cholecalciferol (VITAMIN D) tablet 1,000 Units  1,000 Units Oral Daily Theodoro Grist, MD   1,000 Units at 02/23/15 1520  . citalopram (CELEXA) tablet 20 mg  20 mg Oral Daily Theodoro Grist, MD   20 mg at 02/23/15 1521  . clopidogrel (PLAVIX) tablet 75 mg  75 mg Oral Daily Theodoro Grist, MD   75 mg at 02/23/15 1521  . docusate sodium (COLACE) capsule 100 mg  100 mg Oral BID Bettey Costa, MD   100 mg at 02/23/15 1521  . feeding supplement (NEPRO CARB STEADY) liquid 237 mL  237 mL Oral Daily Munsoor Lateef, MD   237 mL at 02/22/15 1000  . gabapentin (NEURONTIN) capsule 100 mg  100 mg Oral Daily Theodoro Grist, MD   100 mg at 02/22/15 1111  . heparin injection 1,000 Units   1,000 Units Dialysis PRN Munsoor Lateef, MD      . heparin injection 5,000 Units  5,000 Units Subcutaneous 3 times per day Aldean Jewett, MD   5,000 Units at 02/23/15 0906  . HYDROcodone-acetaminophen (NORCO/VICODIN) 5-325 MG per tablet 1 tablet  1 tablet Oral Q6H PRN Theodoro Grist, MD      . hydrocortisone 1 % ointment 1 application  1 application Topical BID PRN Theodoro Grist, MD      . HYDROmorphone (DILAUDID) injection 1 mg  1 mg Intravenous Q4H PRN Theodoro Grist, MD   1 mg at 02/23/15 0848  . levofloxacin (LEVAQUIN) IVPB 500 mg  500 mg Intravenous Q48H Aldean Jewett, MD   500 mg at 02/21/15 1758  . lidocaine (PF) (XYLOCAINE) 1 % injection 5 mL  5 mL Intradermal PRN Munsoor Lateef, MD   2 mL at 02/23/15 1019  . lidocaine-prilocaine (EMLA) cream 1 application  1 application Topical PRN Munsoor Lateef, MD      . lipase/protease/amylase (CREON) capsule 24,000-48,000 Units  24,000-48,000 Units Oral 5 X Daily Theodoro Grist, MD   48,000 Units at 02/22/15 1821  . metoCLOPramide (REGLAN) tablet 10 mg  10 mg Oral QID PRN Aldean Jewett, MD   10 mg at 02/22/15 1437  . nitroGLYCERIN (NITROSTAT) SL tablet 0.4 mg  0.4 mg Sublingual Q5 min PRN Theodoro Grist, MD      . omega-3 acid ethyl esters (LOVAZA) capsule 1 g  1 g Oral Daily Theodoro Grist, MD   1 g at 02/22/15 1113  . ondansetron (ZOFRAN) injection 4 mg  4 mg Intravenous Q4H PRN Theodoro Grist, MD   4 mg at 02/23/15 0848  . pantoprazole (PROTONIX) EC tablet 40 mg  40 mg Oral BID Aldean Jewett, MD   40 mg at 02/23/15 1520  . pentafluoroprop-tetrafluoroeth (GEBAUERS) aerosol 1  application  1 application Topical PRN Munsoor Lateef, MD      . polyethylene glycol (MIRALAX / GLYCOLAX) packet 17 g  17 g Oral Daily PRN Aldean Jewett, MD      . promethazine (PHENERGAN) injection 12.5 mg  12.5 mg Intravenous Q4H PRN Theodoro Grist, MD   12.5 mg at 02/22/15 0854  . ranolazine (RANEXA) 12 hr tablet 500 mg  500 mg Oral BID Aldean Jewett, MD    500 mg at 02/22/15 2101  . sevelamer carbonate (RENVELA) tablet 800 mg  800 mg Oral TID WC Munsoor Lateef, MD   800 mg at 02/22/15 1822  . sucralfate (CARAFATE) 1 GM/10ML suspension 1 g  1 g Oral TID WC & HS Aldean Jewett, MD   1 g at 02/22/15 1822    Allergies as of 02/19/2015 - Review Complete 02/19/2015  Allergen Reaction Noted  . Ace inhibitors Swelling 02/08/2015  . Codeine Nausea And Vomiting 02/06/2015  . Compazine [prochlorperazine edisylate] Nausea And Vomiting 02/19/2015  . Oxycodone Anxiety 02/06/2015  . Tape Rash 02/08/2015     Review of Systems:    All systems reviewed and negative except where noted in HPI. Marland Kitchen    Physical Exam:  Vital signs in last 24 hours: Temp:  [97.5 F (36.4 C)-98.2 F (36.8 C)] 97.9 F (36.6 C) (06/03 1409) Pulse Rate:  [64-80] 78 (06/03 1458) Resp:  [16-20] 17 (06/03 1458) BP: (143-168)/(60-80) 165/60 mmHg (06/03 1458) SpO2:  [94 %-95 %] 95 % (06/03 0520) Weight:  [85 kg (187 lb 6.3 oz)-86.4 kg (190 lb 7.6 oz)] 85 kg (187 lb 6.3 oz) (06/03 1458) Last BM Date: 02/23/15 General:   Pleasant woman  in NAD, just finished dialysis Head:  Normocephalic and atraumatic. Eyes:   No icterus.   Conjunctiva pink. Ears:  Normal auditory acuity. Mouth: Mucosa pink moist, no lesions. Neck:  Supple; no masses felt Lungs: Respirations even and unlabored. Lungs clear to auscultation bilaterally.   No wheezes, crackles, or rhonchi.  Heart:  S1S2, RRR, no MRG. No edema. Abdomen:   Flat, soft, nondistended, ruq/epigsatric tenderness. Normal bowel sounds. No appreciable masses or hepatomegaly. No rebound signs or other peritoneal signs.  Msk:  MAEW x4, No clubbing or cyanosis. Strength 5/5. Symmetrical without gross deformities. Neurologic:  Alert and  oriented x4;  Cranial nerves II-XII intact. However, appears somewhat foggy at times during conversation. Skin:  Warm, dry, sallow, without significant lesions or rashes. Psych:  Alert and cooperative.  Normal affect.  LAB RESULTS:  Recent Labs  02/21/15 0731 02/23/15 0501  WBC 7.1 5.2  HGB 11.8* 10.7*  HCT 36.8 34.1*  PLT 141* 114*   BMET  Recent Labs  02/22/15 0526 02/22/15 1139 02/23/15 0501  NA 137 136 132*  K 4.4 4.4 4.0  CL 98* 99* 94*  CO2 31 28 28   GLUCOSE 139* 132* 178*  BUN 8 9 12   CREATININE 4.66* 5.04* 5.58*  CALCIUM 8.3* 8.4* 8.6*   LFT  Recent Labs  02/22/15 1139  PROT 6.7  ALBUMIN 3.2*  AST 38  ALT 15  ALKPHOS 102  BILITOT 1.5*   PT/INR No results for input(s): LABPROT, INR in the last 72 hours.  STUDIES: Dg Chest 1 View  02/21/2015   CLINICAL DATA:  Thoracentesis  EXAM: CHEST  1 VIEW  COMPARISON:  02/20/2015  FINDINGS: There is no evidence of pneumothorax. Cardiomegaly is stable. Bibasilar opacity is stable.  IMPRESSION: No pneumothorax post thoracentesis.  Persistent bibasilar opacity.  Electronically Signed   By: Marybelle Killings M.D.   On: 02/21/2015 17:25   Ct Abdomen Pelvis W Contrast  02/22/2015   CLINICAL DATA:  Generalized abdominal pain, nausea and vomiting, history of pancreatitis  EXAM: CT ABDOMEN AND PELVIS WITH CONTRAST  TECHNIQUE: Multidetector CT imaging of the abdomen and pelvis was performed using the standard protocol following bolus administration of intravenous contrast.  CONTRAST:  160mL OMNIPAQUE IOHEXOL 300 MG/ML  SOLN  COMPARISON:  03/31/2013  FINDINGS: Sagittal images of the spine shows mild degenerative changes lower thoracic spine. There is mild anasarca infiltration of subcutaneous fat abdominal and pelvic wall.  Small right pleural effusion. Trace left pleural effusion. Mild basilar posterior atelectasis. Enhanced liver shows no focal mass. The patient is status postcholecystectomy. The pancreas and spleen is unremarkable. Adrenal glands are unremarkable. Again noted renal atrophy bilaterally. No hydronephrosis or hydroureter. There is global mild decreased enhancement bilateral kidneys. There is delay excretion bilateral  kidneys. Findings surgery decrease renal function bilaterally. Clinical correlation is necessary.  No aortic aneurysm.  Small hiatal hernia.  Atherosclerotic calcifications of abdominal aorta and SMA.  Moderate stool noted in hepatic flexure of the colon. There is no pericecal inflammation.  Scattered diverticula are noted descending colon and sigmoid colon. There is minimal thickening of colonic wall in splenic flexure of the colon. Minimal colitis cannot be excluded. Mild thickening of colonic wall in sigmoid colon and rectum. Mild colitis cannot be excluded. There is no mesenteric or pericolonic abscess. No diverticular abscess is noted.  The patient is status post hysterectomy. The patient is status post appendectomy. A right inguinal lymph node measures 2.2 x 1.5 cm. Left inguinal lymph node measures 2.3 by 1.3 cm.  IMPRESSION: 1. There is small right pleural effusion. Trace left pleural effusion. Mild bilateral basilar posterior atelectasis. 2. Status postcholecystectomy. 3. Again noted bilateral renal atrophy. No hydronephrosis or hydroureter. 4. Minimal thickening of colonic wall in splenic flexure of the colon. Mild thickening of colonic wall in sigmoid colon and rectum. Mild colitis cannot be excluded. Scattered diverticula are noted in descending colon and sigmoid colon. No evidence of acute diverticulitis. No pericolonic abscess. No diverticular abscess. 5. Status post appendectomy.  No pericecal inflammation. 6. Status post hysterectomy. 7. Bilateral decrease parenchymal enhancement and excretion of the kidneys suggesting bilateral decreased renal function. Clinical correlation is necessary   Electronically Signed   By: Lahoma Crocker M.D.   On: 02/22/2015 16:58   US Abdomen Limited  02/21/2015   CLINICAL DATA:  Ascites.  EXAM: LIMITED ABDOMEN ULTRASOUND FOR ASCITES  TECHNIQUE: Limited ultrasound survey for ascites was performed in all four abdominal quadrants.  COMPARISON:  02/21/2015.  FINDINGS: There  is no ascites identified in all 4 quadrants evaluated.  IMPRESSION: Negative for ascites.   Electronically Signed   By: Dereck Ligas M.D.   On: 02/21/2015 18:17   US Thoracentesis Asp Pleural Space W/img Guide  02/21/2015   CLINICAL DATA:  Shortness of breath.  EXAM: ULTRASOUND GUIDED LEFT THORACENTESIS  COMPARISON:  Chest radiograph 02/20/2015  PROCEDURE: An ultrasound guided thoracentesis was thoroughly discussed with the patient and questions answered. The benefits, risks, alternatives and complications were also discussed. The patient understands and wishes to proceed with the procedure. Written consent was obtained.  Ultrasound was performed to localize and mark an adequate pocket of fluid in the left posterior chest. The area was then prepped and draped in the normal sterile fashion. 1% Lidocaine was used for local anesthesia. Under ultrasound guidance  a 19 gauge Yueh catheter was introduced. Thoracentesis was performed. The catheter was removed and a dressing applied.  COMPLICATIONS: None.  FINDINGS: A total of approximately 400 mL of amber color fluid was removed. A fluid sample was sent for laboratory analysis.  IMPRESSION: Successful ultrasound guided left thoracentesis yielding 400 mL of pleural fluid.   Electronically Signed   By: Markus Daft M.D.   On: 02/21/2015 17:13       Impression / Plan:   1. Dyspepsia: suspect this is multifactorial given her gastroparesis, GERD, chronic renal disease, current antibitoics,  recent NSAID use, possible viral exposure.?contrast dye may have exacerbated this as well. Agree with PPI/reglan. WIll schedule antiemetics for the next 24-36h.  Due to concern of possible blood in emesis, will discuss EGD with Dr Gustavo Lah. The patient will need to be off anticoagulants for this. In the meantime, follow hgb/transfuse prn, bland/soft easy to digest foods and liberal liquids as consistent with her renal diet. 2. Irregular bowel habits- has history of EPI/IBS.  Suspect some dietary and medication nonadherence. Will ask for nutrition consult while her in hospital, especially in conjunction with her gastroparesis.  Thank you very much for this consult. These services were provided by Stephens November, NP-C, in collaboration with Lollie Sails, MD, with whom I have discussed this patient in full.   Stephens November, NP-C  Addendum: did discuss patient with Dr Gustavo Lah: will plan for EGD as clinically feasible- will obtain cardiac clearance due to patient's history of CAD/MI/cardiac stents/anticoagulant use. Will also assess NH4 due to mild mental fogginess.

## 2015-02-24 DIAGNOSIS — K3184 Gastroparesis: Secondary | ICD-10-CM

## 2015-02-24 LAB — CULTURE, BLOOD (ROUTINE X 2)
Culture: NO GROWTH
Culture: NO GROWTH

## 2015-02-24 LAB — BODY FLUID CULTURE: Culture: NO GROWTH

## 2015-02-24 LAB — GLUCOSE, CAPILLARY
Glucose-Capillary: 132 mg/dL — ABNORMAL HIGH (ref 65–99)
Glucose-Capillary: 131 mg/dL — ABNORMAL HIGH (ref 65–99)

## 2015-02-24 LAB — HEMOGLOBIN: Hemoglobin: 11 g/dL — ABNORMAL LOW (ref 12.0–16.0)

## 2015-02-24 MED ORDER — LEVOFLOXACIN 500 MG PO TABS: 500.0000 mg | ORAL_TABLET | ORAL | Status: DC

## 2015-02-24 MED ORDER — METOCLOPRAMIDE HCL 5 MG/ML IJ SOLN: 5.0000 mg | Freq: Once | INTRAMUSCULAR | Status: DC

## 2015-02-24 MED ORDER — METOCLOPRAMIDE HCL 5 MG PO TABS: 5.0000 mg | ORAL_TABLET | Freq: Three times a day (TID) | ORAL | Status: DC

## 2015-02-24 MED ORDER — SUCRALFATE 1 G PO TABS: 1.0000 g | ORAL_TABLET | Freq: Three times a day (TID) | ORAL | Status: DC

## 2015-02-24 MED ORDER — PANTOPRAZOLE SODIUM 40 MG PO TBEC: 40.0000 mg | DELAYED_RELEASE_TABLET | Freq: Two times a day (BID) | ORAL | Status: DC

## 2015-02-24 NOTE — Discharge Instructions (Signed)
°  DIET:  Renal diet  DISCHARGE CONDITION:  Stable  ACTIVITY:  Activity as tolerated  OXYGEN:  Home Oxygen: No.   Oxygen Delivery: room air  DISCHARGE LOCATION:  home   If you experience worsening of your admission symptoms, develop shortness of breath, life threatening emergency, suicidal or homicidal thoughts you must seek medical attention immediately by calling 911 or calling your MD immediately  if symptoms less severe.  You Must read complete instructions/literature along with all the possible adverse reactions/side effects for all the Medicines you take and that have been prescribed to you. Take any new Medicines after you have completely understood and accpet all the possible adverse reactions/side effects.   Please note  You were cared for by a hospitalist during your hospital stay. If you have any questions about your discharge medications or the care you received while you were in the hospital after you are discharged, you can call the unit and asked to speak with the hospitalist on call if the hospitalist that took care of you is not available. Once you are discharged, your primary care physician will handle any further medical issues. Please note that NO REFILLS for any discharge medications will be authorized once you are discharged, as it is imperative that you return to your primary care physician (or establish a relationship with a primary care physician if you do not have one) for your aftercare needs so that they can reassess your need for medications and monitor your lab values.

## 2015-02-24 NOTE — Progress Notes (Signed)
Assumed care of patient at 0300. Pt resting in bed during report and rounds.

## 2015-02-24 NOTE — Plan of Care (Addendum)
Problem: Discharge Progression Outcomes Goal: Discharge plan in place and appropriate Individualization  Pt prefers to be called [name]. Patient receives hemodialysis MWF. Patient is a high falls risk, toileting offered every hour. Bed alarm on. Patient has history of ESRD, HTN, DM, Gerd, CAD, Collagen Vascular disease, MI, diabetic neuropathy, controlled with home medications.    Goal: Other Discharge Outcomes/Goals Outcome: Progressing Plan of Care Progressing to Goal: Assumed care of patient at 0300.  Pt c/o pain, improved with PRN pain medication.  C/o nausea, scheduled zofran given per MD order.

## 2015-02-24 NOTE — Progress Notes (Signed)
[facility]  ROUNDING NOTE   Subjective:  Able to tolerate clears this AM Hoping to go home this afternoon No SOB  Objective:  Vital signs in last 24 hours:  Temp:  [97.7 F (36.5 C)-98.3 F (36.8 C)] 97.7 F (36.5 C) (06/04 0517) Pulse Rate:  [65-80] 65 (06/04 0517) Resp:  [17-19] 19 (06/03 2118) BP: (147-168)/(51-69) 152/57 mmHg (06/04 0517) SpO2:  [94 %-96 %] 95 % (06/04 0517) Weight:  [85 kg (187 lb 6.3 oz)] 85 kg (187 lb 6.3 oz) (06/03 1458)  Weight change:  Filed Weights   02/21/15 1511 02/23/15 1019 02/23/15 1458  Weight: 86.4 kg (190 lb 7.6 oz) 86.4 kg (190 lb 7.6 oz) 85 kg (187 lb 6.3 oz)    Intake/Output: I/O last 3 completed shifts: In: -  Out: 1500 [Other:1500]   Intake/Output this shift:     Physical Exam: General: NAD  Head: Normocephalic, atraumatic. Moist oral mucosal membranes  Eyes: Anicteric  Neck: Supple, trachea midline  Lungs:  Diminished BS at bases, normal effort  Heart: Regular rate and rhythm S1S2  Abdomen:  Soft, nontender,   Extremities:  no peripheral edema.  Neurologic: Awake, alert, follows commands  Skin: No rashes  Access: LUE AV access    Basic Metabolic Panel:  Recent Labs Lab 02/17/15 1955  02/20/15 0036 02/21/15 0707 02/21/15 1152 02/22/15 0526 02/22/15 1139 02/23/15 0501  NA 134*  < > 136 137  --  137 136 132*  K 4.0  < > 3.7 4.8  --  4.4 4.4 4.0  CL 95*  < > 96* 98*  --  98* 99* 94*  CO2 28  < > 30 25  --  31 28 28   GLUCOSE 123*  < > 58* 41*  --  139* 132* 178*  BUN 23*  < > 14 18  --  8 9 12   CREATININE 7.72*  < > 5.90* 7.97*  --  4.66* 5.04* 5.58*  CALCIUM 9.0  < > 8.5* 8.8*  --  8.3* 8.4* 8.6*  MG 2.6*  --   --   --   --   --   --   --   PHOS  --   --   --  3.5 3.1 2.5  --   --   < > = values in this interval not displayed.  Liver Function Tests:  Recent Labs Lab 02/17/15 1955 02/20/15 0035 02/21/15 0707 02/22/15 0526 02/22/15 1139  AST 20 22  --   --  38  ALT 16 16  --   --  15   ALKPHOS 124 102  --   --  102  BILITOT 0.6 0.4  --   --  1.5*  PROT 7.4 6.7  --   --  6.7  ALBUMIN 4.0 3.5 3.3* 2.9* 3.2*    Recent Labs Lab 02/17/15 1955 02/20/15 0035 02/21/15 1152 02/22/15 1139 02/23/15 0501  LIPASE 28 20*  --  20* 21*  AMYLASE  --   --  23*  --   --     Recent Labs Lab 02/23/15 2114  AMMONIA 17    CBC:  Recent Labs Lab 02/17/15 1955 02/19/15 1133 02/20/15 0036 02/21/15 0731 02/23/15 0501 02/24/15 0825  WBC 5.6 5.3 4.5 7.1 5.2  --   NEUTROABS 2.9  --   --   --   --   --   HGB 11.9* 11.4* 11.1* 11.8* 10.7* 11.0*  HCT 37.2 35.8 35.3 36.8 34.1*  --  MCV 85.8 85.4 86.2 86.4 85.9  --   PLT 135* 141* 130* 141* 114*  --     Cardiac Enzymes:  Recent Labs Lab 02/19/15 1133 02/19/15 1814 02/20/15 0034  TROPONINI <0.03 <0.03 <0.03    BNP: Invalid input(s): POCBNP  CBG:  Recent Labs Lab 02/23/15 0717 02/23/15 1632 02/23/15 2202 02/24/15 0744 02/24/15 1106  GLUCAP 165* 108* 154* 132* 131*    Microbiology: Results for orders placed or performed during the hospital encounter of 02/19/15  Blood culture (routine x 2)     Status: None   Collection Time: 02/19/15 12:20 PM  Result Value Ref Range Status   Specimen Description BLOOD  Final   Special Requests Immunocompromised  Final   Culture NO GROWTH 5 DAYS  Final   Report Status 02/24/2015 FINAL  Final  Blood culture (routine x 2)     Status: None   Collection Time: 02/19/15 12:30 PM  Result Value Ref Range Status   Specimen Description BLOOD  Final   Special Requests Immunocompromised  Final   Culture NO GROWTH 5 DAYS  Final   Report Status 02/24/2015 FINAL  Final  Body fluid culture     Status: None   Collection Time: 02/20/15  2:55 PM  Result Value Ref Range Status   Specimen Description PLEURAL  Final   Special Requests NONE  Final   Gram Stain   Final    FEW WBC SEEN RARE RED BLOOD CELLS FEW GRAM VARIABLE ROD    Culture NO GROWTH 4 DAYS  Final   Report Status  02/24/2015 FINAL  Final  Body fluid culture     Status: None (Preliminary result)   Collection Time: 02/21/15  4:30 PM  Result Value Ref Range Status   Specimen Description PLEURAL  Final   Special Requests NONE  Final   Gram Stain PENDING  Incomplete   Culture NO ANAEROBES ISOLATED  Final   Report Status PENDING  Incomplete    Coagulation Studies: No results for input(s): LABPROT, INR in the last 72 hours.  Urinalysis: No results for input(s): COLORURINE, LABSPEC, PHURINE, GLUCOSEU, HGBUR, BILIRUBINUR, KETONESUR, PROTEINUR, UROBILINOGEN, NITRITE, LEUKOCYTESUR in the last 72 hours.  Invalid input(s): APPERANCEUR    Imaging: Ct Abdomen Pelvis W Contrast  02/22/2015   CLINICAL DATA:  Generalized abdominal pain, nausea and vomiting, history of pancreatitis  EXAM: CT ABDOMEN AND PELVIS WITH CONTRAST  TECHNIQUE: Multidetector CT imaging of the abdomen and pelvis was performed using the standard protocol following bolus administration of intravenous contrast.  CONTRAST:  164mL OMNIPAQUE IOHEXOL 300 MG/ML  SOLN  COMPARISON:  03/31/2013  FINDINGS: Sagittal images of the spine shows mild degenerative changes lower thoracic spine. There is mild anasarca infiltration of subcutaneous fat abdominal and pelvic wall.  Small right pleural effusion. Trace left pleural effusion. Mild basilar posterior atelectasis. Enhanced liver shows no focal mass. The patient is status postcholecystectomy. The pancreas and spleen is unremarkable. Adrenal glands are unremarkable. Again noted renal atrophy bilaterally. No hydronephrosis or hydroureter. There is global mild decreased enhancement bilateral kidneys. There is delay excretion bilateral kidneys. Findings surgery decrease renal function bilaterally. Clinical correlation is necessary.  No aortic aneurysm.  Small hiatal hernia.  Atherosclerotic calcifications of abdominal aorta and SMA.  Moderate stool noted in hepatic flexure of the colon. There is no pericecal  inflammation.  Scattered diverticula are noted descending colon and sigmoid colon. There is minimal thickening of colonic wall in splenic flexure of the colon. Minimal colitis cannot be  excluded. Mild thickening of colonic wall in sigmoid colon and rectum. Mild colitis cannot be excluded. There is no mesenteric or pericolonic abscess. No diverticular abscess is noted.  The patient is status post hysterectomy. The patient is status post appendectomy. A right inguinal lymph node measures 2.2 x 1.5 cm. Left inguinal lymph node measures 2.3 by 1.3 cm.  IMPRESSION: 1. There is small right pleural effusion. Trace left pleural effusion. Mild bilateral basilar posterior atelectasis. 2. Status postcholecystectomy. 3. Again noted bilateral renal atrophy. No hydronephrosis or hydroureter. 4. Minimal thickening of colonic wall in splenic flexure of the colon. Mild thickening of colonic wall in sigmoid colon and rectum. Mild colitis cannot be excluded. Scattered diverticula are noted in descending colon and sigmoid colon. No evidence of acute diverticulitis. No pericolonic abscess. No diverticular abscess. 5. Status post appendectomy.  No pericecal inflammation. 6. Status post hysterectomy. 7. Bilateral decrease parenchymal enhancement and excretion of the kidneys suggesting bilateral decreased renal function. Clinical correlation is necessary   Electronically Signed   By: Lahoma Crocker M.D.   On: 02/22/2015 16:58     Medications:     . amLODipine  10 mg Oral Daily  . aspirin EC  81 mg Oral Daily  . atorvastatin  40 mg Oral Daily  . buPROPion  150 mg Oral BID  . carvedilol  25 mg Oral BID  . cholecalciferol  1,000 Units Oral Daily  . citalopram  20 mg Oral Daily  . clopidogrel  75 mg Oral Daily  . docusate sodium  100 mg Oral BID  . feeding supplement (NEPRO CARB STEADY)  237 mL Oral Daily  . gabapentin  100 mg Oral Daily  . levofloxacin (LEVAQUIN) IV  500 mg Intravenous Q48H  . lipase/protease/amylase   24,000-48,000 Units Oral 5 X Daily  . metoCLOPramide (REGLAN) injection  5 mg Intravenous Once  . metoCLOPramide  5 mg Oral TID AC & HS  . omega-3 acid ethyl esters  1 g Oral Daily  . ondansetron (ZOFRAN) IV  4 mg Intravenous 4 times per day  . pantoprazole  40 mg Oral BID  . ranolazine  500 mg Oral BID  . sevelamer carbonate  800 mg Oral TID WC  . sucralfate  1 g Oral TID WC & HS   sodium chloride, sodium chloride, acetaminophen **OR** acetaminophen, albuterol, heparin, HYDROcodone-acetaminophen, hydrocortisone, lidocaine (PF), lidocaine-prilocaine, nitroGLYCERIN, pentafluoroprop-tetrafluoroeth, polyethylene glycol, promethazine  Assessment/ Plan:  54 yo AAF with h/o ESRD, DM (30 yrs with retinopathy, neuropathy), OA, IBS, hyperlipidemia, h/o GI bleed, SHPTH, AOCD, diastolic heart failure , RCA stent Feb 2013, C. diff colitis 7/14, clotted RUE AVF 10/14, admission for shortness of breath 02/20/15.  1.  ESRD on HD MWF:   next HD on Monday. 2.  Shortness of breath/bilateral pleural effusions:  S/p right and left thoracentesis.  Breathing improved.   3.  Anemia of CKD: hgb 11; consider restarting epogen next week or as an outpt. 4.  SHPTH of renal origin:  Phos well controlled, continue to monitor periodically, continue renvela.    LOS: 5 Jamieson Lisa 6/4/201612:02 PM

## 2015-02-24 NOTE — Progress Notes (Signed)
Pt alert and oriented. Still having nausea. Receiving schedule and prn nausea medications. Medicated for pain during the night with good results. Up to the bathroom with assistance. Dressings to back remaining dry and intact. Remaining on room air. No reports of shortness of breath.

## 2015-02-24 NOTE — Plan of Care (Signed)
Problem: Discharge Progression Outcomes Goal: Other Discharge Outcomes/Goals Outcome: Progressing Pt is alert and oriented x 4, up with one assist, pt is on hemodialysis, no urine throughoutshift. Pt c/o nausea and abdominal pain improved with prn medicine and zofran. No emesis throughout shift, Pt is d/c to home , pt is to schedule f/u appointments and resume dialysis on Monday. Pt able to tolerate lunch. Pt reports understanding of d/c instructions, FSBS remains stable, hgb remains stable. Pt prefers to use zofran over reglan.

## 2015-02-25 NOTE — Discharge Summary (Signed)
[facility] [place] NAME: [name]    MR#:  [id number]  DATE OF BIRTH:  1961-06-17  DATE OF ADMISSION:  02/19/2015 ADMITTING PHYSICIAN: Aldean Jewett, MD  DATE OF DISCHARGE: 02/24/2015  2:41 PM  PRIMARY CARE PHYSICIAN: Ellamae Sia, MD    ADMISSION DIAGNOSIS:  Hypoxia [R09.02] HCAP (healthcare-associated pneumonia) [J18.9]  DISCHARGE DIAGNOSIS:  Active Problems:   Pleural effusion   HCAP (healthcare-associated pneumonia)   End-stage renal disease on hemodialysis   Gastroparesis   SECONDARY DIAGNOSIS:   Past Medical History  Diagnosis Date  . Asthma   . Collagen vascular disease   . Diabetes mellitus without complication   . Hypertension   . Renal insufficiency dialysis     3 days weekly mon wed friday  . Coronary artery disease     stent  . Diabetic neuropathy   . ESRD (end stage renal disease) on dialysis     M-W-F  . GERD (gastroesophageal reflux disease)   . Hx of pancreatitis 2015  . Myocardial infarction   . Pneumonia      ADMITTING HISTORY HISTORY OF PRESENT ILLNESS:  [name] is a 54 y.o. female with a known history of end-stage renal disease on hemodialysis, diabetes, coronary artery disease with a history of MI in the past to presents today from dialysis with shortness of breath. She has had shortness of breath for the past week. She actually presented to the emergency room on 5/28 and was diagnosed with pneumonia and started on Levaquin. Today during dialysis she became acutely worse and had to finish her dialysis session 30 minutes early. She has also had nausea with multiple episodes of emesis, no diarrhea. She has not had any fevers chills or abdominal pain. Shortness of breath worse with exertion. No coughing or sputum production. No chest pain. ED evaluation shows right-sided patchy lower lobe opacity with right-sided effusion possibly parapneumonic. She is being admitted for treatment of  healthcare associated pneumonia and further evaluation of pleural effusion.      HOSPITAL COURSE:   #1 healthcare associated pneumonia:  -Continue Levaquin - Blood cultures negative, pleural effusion- transudate. Cultures negative. - Appreciate pulmonology consultation - Status post right sided paracentesis on 5/31 and left sided thoracentesis on 6/1, breathing much better. Levaquin after discharge for 10 more days.  #2 end-stage renal disease on hemodialysis:  - Nephrology following, dialysis Monday Wednesday Friday  #3 nausea and vomiting with abdominal pain:  --Her CT scan of the abdomen and pelvis did not show any acute E etiology Some dark stools but hb stable. Seen by GI and thought to be likely from chronic pancreatitis/GERD and multifactorial. Started on reglan, protonix, sucralfate which pt has been non compliant with. Set up an appointment with GI as outpatient and discharged home in a stable condition after counseling to be compliant with medications.  #4 coronary artery disease with history of MI:  - Cardiac enzymes negative. No events on telemetry. No chest pain -Continue Plavix, aspirin, beta blocker, Ranexa  #5 hypoglycemia: - Discontinue Levemir. Liberalize diet. Continue with Reglan and Zofran  #6 hypertension controlled on amlodipine, carvedilol   CONSULTS OBTAINED:  Treatment Team:  Anthonette Legato, MD Theodoro Grist, MD Allyne Gee, MD Lollie Sails, MD  DRUG ALLERGIES:   Allergies  Allergen Reactions  . Ace Inhibitors Swelling  . Codeine Nausea And Vomiting  . Compazine [Prochlorperazine Edisylate] Nausea And Vomiting  . Oxycodone Anxiety  . Tape  Rash    DISCHARGE MEDICATIONS:   Discharge Medication List as of 02/24/2015  1:54 PM    START taking these medications   Details  !! levofloxacin (LEVAQUIN) 500 MG tablet Take 1 tablet (500 mg total) by mouth every other day., Starting 02/24/2015, Until Discontinued, Print    pantoprazole  (PROTONIX) 40 MG tablet Take 1 tablet (40 mg total) by mouth 2 (two) times daily before a meal., Starting 02/24/2015, Until Discontinued, Print    sucralfate (CARAFATE) 1 G tablet Take 1 tablet (1 g total) by mouth 4 (four) times daily -  with meals and at bedtime., Starting 02/24/2015, Until Discontinued, Print     !! - Potential duplicate medications found. Please discuss with provider.    CONTINUE these medications which have CHANGED   Details  metoCLOPramide (REGLAN) 5 MG tablet Take 1 tablet (5 mg total) by mouth 4 (four) times daily -  before meals and at bedtime., Starting 02/24/2015, Until Discontinued, Print      CONTINUE these medications which have NOT CHANGED   Details  albuterol (PROVENTIL HFA;VENTOLIN HFA) 108 (90 BASE) MCG/ACT inhaler Inhale 4-6 puffs by mouth every 4 hours as needed for wheezing, cough, and/or shortness of breath, Print    amLODipine (NORVASC) 10 MG tablet Take 10 mg by mouth daily., Until Discontinued, Historical Med    aspirin EC 81 MG tablet Take 81 mg by mouth daily., Until Discontinued, Historical Med    atorvastatin (LIPITOR) 40 MG tablet Take 40 mg by mouth daily., Until Discontinued, Historical Med    buPROPion (WELLBUTRIN SR) 150 MG 12 hr tablet Take 150 mg by mouth 2 (two) times daily., Until Discontinued, Historical Med    carvedilol (COREG) 25 MG tablet Take 25 mg by mouth 2 (two) times daily., Until Discontinued, Historical Med    cetirizine (ZYRTEC) 10 MG tablet Take 10 mg by mouth daily., Until Discontinued, Historical Med    citalopram (CELEXA) 20 MG tablet Take 20 mg by mouth daily., Until Discontinued, Historical Med    clopidogrel (PLAVIX) 75 MG tablet Take 75 mg by mouth daily., Until Discontinued, Historical Med    Ergocalciferol (VITAMIN D2) 400 UNITS TABS Take 2 tablets by mouth daily. , Until Discontinued, Historical Med    gabapentin (NEURONTIN) 100 MG capsule Take 100 mg by mouth daily., Until Discontinued, Historical Med     HYDROcodone-acetaminophen (NORCO/VICODIN) 5-325 MG per tablet Take 1 tablet by mouth every 6 (six) hours as needed for moderate pain., Starting 02/08/2015, Until Discontinued, Print    hydrocortisone 1 % ointment Apply 1 application topically 2 (two) times daily., Until Discontinued, Historical Med    insulin aspart (NOVOLOG) 100 UNIT/ML injection Inject 2-6 Units into the skin 3 (three) times daily with meals. Pt uses as needed per sliding scale., Until Discontinued, Historical Med    insulin detemir (LEVEMIR) 100 UNIT/ML injection Inject 8 Units into the skin at bedtime., Until Discontinued, Historical Med    lipase/protease/amylase (CREON) 12000 UNITS CPEP capsule Take 24,000-48,000 Units by mouth 5 (five) times daily. Pt takes four capsules with meals and two capsules with snacks., Until Discontinued, Historical Med    nitroGLYCERIN (NITROSTAT) 0.4 MG SL tablet Place 0.4 mg under the tongue every 5 (five) minutes as needed for chest pain., Until Discontinued, Historical Med    Omega-3 Fatty Acids (FISH OIL) 1000 MG CAPS Take 1 capsule by mouth 2 (two) times daily., Until Discontinued, Historical Med    ondansetron (ZOFRAN) 4 MG tablet Take 1 tablet (4 mg  total) by mouth daily as needed for nausea or vomiting., Starting 02/08/2015, Until Discontinued, Print    ranitidine (ZANTAC) 150 MG tablet Take 150 mg by mouth 2 (two) times daily., Until Discontinued, Historical Med    ranolazine (RANEXA) 1000 MG SR tablet Take 1,000 mg by mouth 2 (two) times daily. , Until Discontinued, Historical Med    sevelamer carbonate (RENVELA) 800 MG tablet Take 800 mg by mouth 3 (three) times daily with meals., Until Discontinued, Historical Med    !! levofloxacin (LEVAQUIN) 500 MG tablet Take 1 tablet by mouth every 48 hours starting on 02/19/2015, Print     !! - Potential duplicate medications found. Please discuss with provider.       Today    VITAL SIGNS:  Blood pressure 152/57, pulse 65,  temperature 97.7 F (36.5 C), temperature source Oral, resp. rate 19, height 5\' 5"  (1.651 m), weight 85 kg (187 lb 6.3 oz), SpO2 95 %.  I/O:  No intake or output data in the 24 hours ending 02/25/15 1211  PHYSICAL EXAMINATION:  Physical Exam  GENERAL:  54 y.o.-year-old patient lying in the bed with no acute distress.  LUNGS: Normal breath sounds bilaterally, no wheezing, rales,rhonchi or crepitation. No use of accessory muscles of respiration.  CARDIOVASCULAR: S1, S2 normal. No murmurs, rubs, or gallops.  ABDOMEN: Soft, non-tender, non-distended. Bowel sounds present. No organomegaly or mass.  NEUROLOGIC: Moves all 4 extremities. PSYCHIATRIC: The patient is alert and oriented x 3.  SKIN: No obvious rash, lesion, or ulcer.   DATA REVIEW:   CBC  Recent Labs Lab 02/23/15 0501 02/24/15 0825  WBC 5.2  --   HGB 10.7* 11.0*  HCT 34.1*  --   PLT 114*  --     Chemistries   Recent Labs Lab 02/22/15 1139 02/23/15 0501  NA 136 132*  K 4.4 4.0  CL 99* 94*  CO2 28 28  GLUCOSE 132* 178*  BUN 9 12  CREATININE 5.04* 5.58*  CALCIUM 8.4* 8.6*  AST 38  --   ALT 15  --   ALKPHOS 102  --   BILITOT 1.5*  --     Cardiac Enzymes  Recent Labs Lab 02/20/15 0034  TROPONINI <0.03    Microbiology Results  Results for orders placed or performed during the hospital encounter of 02/19/15  Blood culture (routine x 2)     Status: None   Collection Time: 02/19/15 12:20 PM  Result Value Ref Range Status   Specimen Description BLOOD  Final   Special Requests Immunocompromised  Final   Culture NO GROWTH 5 DAYS  Final   Report Status 02/24/2015 FINAL  Final  Blood culture (routine x 2)     Status: None   Collection Time: 02/19/15 12:30 PM  Result Value Ref Range Status   Specimen Description BLOOD  Final   Special Requests Immunocompromised  Final   Culture NO GROWTH 5 DAYS  Final   Report Status 02/24/2015 FINAL  Final  Body fluid culture     Status: None   Collection Time:  02/20/15  2:55 PM  Result Value Ref Range Status   Specimen Description PLEURAL  Final   Special Requests NONE  Final   Gram Stain   Final    FEW WBC SEEN RARE RED BLOOD CELLS FEW GRAM VARIABLE ROD    Culture NO GROWTH 4 DAYS  Final   Report Status 02/24/2015 FINAL  Final  Body fluid culture     Status: None (Preliminary  result)   Collection Time: 02/21/15  4:30 PM  Result Value Ref Range Status   Specimen Description PLEURAL  Final   Special Requests NONE  Final   Gram Stain PENDING  Incomplete   Culture NO GROWTH 3 DAYS  Final   Report Status PENDING  Incomplete    RADIOLOGY:  No results found.    Management plans discussed with the patient, family and they are in agreement.  CODE STATUS: FULL CODE  TOTAL TIME TAKING CARE OF THIS PATIENT ON DAY OF DISCHARGE: 35 minutes.    [name] R M.D on 02/25/2015 at 12:11 PM  Between 7am to 6pm - Pager - [phone]  After 6pm go to [email or web address] - password [email or web address]  Office  [phone]  CC: Primary care physician; Ellamae Sia, MD

## 2015-02-26 LAB — BODY FLUID CULTURE: Culture: NO GROWTH

## 2015-02-26 LAB — PLEURAL FLUID CYTOLOGY

## 2015-03-02 ENCOUNTER — Emergency Department
Admission: EM | Admit: 2015-03-02 | Discharge: 2015-03-02 | Disposition: A | Discharge status: Home / Self Care | Payer: Medicare Other | Attending: Emergency Medicine | Admitting: Emergency Medicine

## 2015-03-02 ENCOUNTER — Emergency Department: Payer: Medicare Other

## 2015-03-02 DIAGNOSIS — Z992 Dependence on renal dialysis: Secondary | ICD-10-CM | POA: Insufficient documentation

## 2015-03-02 DIAGNOSIS — N186 End stage renal disease: Secondary | ICD-10-CM | POA: Diagnosis not present

## 2015-03-02 DIAGNOSIS — R079 Chest pain, unspecified: Secondary | ICD-10-CM | POA: Diagnosis not present

## 2015-03-02 DIAGNOSIS — Z87891 Personal history of nicotine dependence: Secondary | ICD-10-CM | POA: Insufficient documentation

## 2015-03-02 DIAGNOSIS — I120 Hypertensive chronic kidney disease with stage 5 chronic kidney disease or end stage renal disease: Secondary | ICD-10-CM | POA: Insufficient documentation

## 2015-03-02 DIAGNOSIS — E1140 Type 2 diabetes mellitus with diabetic neuropathy, unspecified: Secondary | ICD-10-CM | POA: Insufficient documentation

## 2015-03-02 DIAGNOSIS — K3184 Gastroparesis: Secondary | ICD-10-CM | POA: Diagnosis not present

## 2015-03-02 LAB — COMPREHENSIVE METABOLIC PANEL
ALT: 15 U/L (ref 14–54)
AST: 22 U/L (ref 15–41)
Albumin: 3.6 g/dL (ref 3.5–5.0)
Alkaline Phosphatase: 99 U/L (ref 38–126)
Anion gap: 11 (ref 5–15)
BUN: 18 mg/dL (ref 6–20)
CO2: 30 mmol/L (ref 22–32)
Calcium: 8.5 mg/dL — ABNORMAL LOW (ref 8.9–10.3)
Chloride: 98 mmol/L — ABNORMAL LOW (ref 101–111)
Creatinine, Ser: 4.6 mg/dL — ABNORMAL HIGH (ref 0.44–1.00)
GFR calc Af Amer: 11 mL/min — ABNORMAL LOW (ref >60)
GFR calc non Af Amer: 10 mL/min — ABNORMAL LOW (ref >60)
Glucose, Bld: 77 mg/dL (ref 65–99)
Potassium: 3.1 mmol/L — ABNORMAL LOW (ref 3.5–5.1)
Sodium: 139 mmol/L (ref 135–145)
Total Bilirubin: 0.8 mg/dL (ref 0.3–1.2)
Total Protein: 6.9 g/dL (ref 6.5–8.1)

## 2015-03-02 LAB — LIPASE, BLOOD: Lipase: 21 U/L — ABNORMAL LOW (ref 22–51)

## 2015-03-02 LAB — TROPONIN I: Troponin I: <0.03 ng/mL (ref <0.031)

## 2015-03-02 MED ORDER — METOCLOPRAMIDE HCL 5 MG/ML IJ SOLN
10.0000 mg | Freq: Once | INTRAMUSCULAR | Status: AC
  Administered 2015-03-02: 10 mg via INTRAVENOUS

## 2015-03-02 MED ORDER — MORPHINE SULFATE 2 MG/ML IJ SOLN
INTRAMUSCULAR | Status: AC
  Administered 2015-03-02: 4 mg via INTRAVENOUS
  Filled 2015-03-02: qty 2

## 2015-03-02 MED ORDER — METOCLOPRAMIDE HCL 5 MG/ML IJ SOLN
INTRAMUSCULAR | Status: AC
  Administered 2015-03-02: 10 mg via INTRAVENOUS
  Filled 2015-03-02: qty 2

## 2015-03-02 MED ORDER — ONDANSETRON HCL 4 MG/2ML IJ SOLN: 4.0000 mg | Freq: Once | INTRAMUSCULAR | Status: DC

## 2015-03-02 MED ORDER — MORPHINE SULFATE 4 MG/ML IJ SOLN: 4.0000 mg | Freq: Once | INTRAMUSCULAR | Status: AC

## 2015-03-02 NOTE — ED Notes (Signed)
Pt comes into the ED via EMS from dialysis center on [address] with c/o increased chest pain with N/V.Marland Kitchenstates she was recently in the hospital and dx with pneumonia from a Monday thru Saturday..states she has had intermittent N/V with the constant chest pain, worse with palpation or movement.

## 2015-03-02 NOTE — ED Notes (Signed)
Dialysis RN at bedside at this time.

## 2015-03-02 NOTE — Treatment Plan (Signed)
Was requested to speak with [facility] on call RN. Spoke with Kathlene Cote [phone] of [place]. He stated he was not on call but would take care of the issue.

## 2015-03-02 NOTE — ED Notes (Signed)
Dialysis, Freida Busman spoke to this RN regarding pt's accessed dialysis caths. Freida Busman states someone is en route to remove the access. Pt made aware and verbalized understanding.

## 2015-03-02 NOTE — Discharge Instructions (Signed)
Chest Pain (Nonspecific) °It is often hard to give a specific diagnosis for the cause of chest pain. There is always a chance that your pain could be related to something serious, such as a heart attack or a blood clot in the lungs. You need to follow up with your health care provider for further evaluation. °CAUSES  °· Heartburn. °· Pneumonia or bronchitis. °· Anxiety or stress. °· Inflammation around your heart (pericarditis) or lung (pleuritis or pleurisy). °· A blood clot in the lung. °· A collapsed lung (pneumothorax). It can develop suddenly on its own (spontaneous pneumothorax) or from trauma to the chest. °· Shingles infection (herpes zoster virus). °The chest wall is composed of bones, muscles, and cartilage. Any of these can be the source of the pain. °· The bones can be bruised by injury. °· The muscles or cartilage can be strained by coughing or overwork. °· The cartilage can be affected by inflammation and become sore (costochondritis). °DIAGNOSIS  °Lab tests or other studies may be needed to find the cause of your pain. Your health care provider may have you take a test called an ambulatory electrocardiogram (ECG). An ECG records your heartbeat patterns over a 24-hour period. You may also have other tests, such as: °· Transthoracic echocardiogram (TTE). During echocardiography, sound waves are used to evaluate how blood flows through your heart. °· Transesophageal echocardiogram (TEE). °· Cardiac monitoring. This allows your health care provider to monitor your heart rate and rhythm in real time. °· Holter monitor. This is a portable device that records your heartbeat and can help diagnose heart arrhythmias. It allows your health care provider to track your heart activity for several days, if needed. °· Stress tests by exercise or by giving medicine that makes the heart beat faster. °TREATMENT  °· Treatment depends on what may be causing your chest pain. Treatment may include: °· Acid blockers for  heartburn. °· Anti-inflammatory medicine. °· Pain medicine for inflammatory conditions. °· Antibiotics if an infection is present. °· You may be advised to change lifestyle habits. This includes stopping smoking and avoiding alcohol, caffeine, and chocolate. °· You may be advised to keep your head raised (elevated) when sleeping. This reduces the chance of acid going backward from your stomach into your esophagus. °Most of the time, nonspecific chest pain will improve within 2-3 days with rest and mild pain medicine.  °HOME CARE INSTRUCTIONS  °· If antibiotics were prescribed, take them as directed. Finish them even if you start to feel better. °· For the next few days, avoid physical activities that bring on chest pain. Continue physical activities as directed. °· Do not use any tobacco products, including cigarettes, chewing tobacco, or electronic cigarettes. °· Avoid drinking alcohol. °· Only take medicine as directed by your health care provider. °· Follow your health care provider's suggestions for further testing if your chest pain does not go away. °· Keep any follow-up appointments you made. If you do not go to an appointment, you could develop lasting (chronic) problems with pain. If there is any problem keeping an appointment, call to reschedule. °SEEK MEDICAL CARE IF:  °· Your chest pain does not go away, even after treatment. °· You have a rash with blisters on your chest. °· You have a fever. °SEEK IMMEDIATE MEDICAL CARE IF:  °· You have increased chest pain or pain that spreads to your arm, neck, jaw, back, or abdomen. °· You have shortness of breath. °· You have an increasing cough, or you cough   up blood.  You have severe back or abdominal pain.  You feel nauseous or vomit.  You have severe weakness.  You faint.  You have chills. This is an emergency. Do not wait to see if the pain will go away. Get medical help at once. Call your local emergency services (911 in [place] Do not drive  yourself to the hospital. MAKE SURE YOU:   Understand these instructions.  Will watch your condition.  Will get help right away if you are not doing well or get worse. Document Released: 06/18/2005 Document Revised: 09/13/2013 Document Reviewed: 04/13/2008 [facility] Patient Information 2015 [place], [place]. This information is not intended to replace advice given to you by your health care provider. Make sure you discuss any questions you have with your health care provider.  Gastroparesis  Gastroparesis is also called slowed stomach emptying (delayed gastric emptying). It is a condition in which the stomach takes too long to empty its contents. It often happens in people with diabetes.  CAUSES  Gastroparesis happens when nerves to the stomach are damaged or stop working. When the nerves are damaged, the muscles of the stomach and intestines do not work normally. The movement of food is slowed or stopped. High blood glucose (sugar) causes changes in nerves and can damage the blood vessels that carry oxygen and nutrients to the nerves. RISK FACTORS  Diabetes.  Post-viral syndromes.  Eating disorders (anorexia, bulimia).  Surgery on the stomach or vagus nerve.  Gastroesophageal reflux disease (rarely).  Smooth muscle disorders (amyloidosis, scleroderma).  Metabolic disorders, including hypothyroidism.  Parkinson disease. SYMPTOMS   Heartburn.  Feeling sick to your stomach (nausea).  Vomiting of undigested food.  An early feeling of fullness when eating.  Weight loss.  Abdominal bloating.  Erratic blood glucose levels.  Lack of appetite.  Gastroesophageal reflux.  Spasms of the stomach wall. Complications can include:  Bacterial overgrowth in stomach. Food stays in the stomach and can ferment and cause bacteria to grow.  Weight loss due to difficulty digesting and absorbing nutrients.  Vomiting.  Obstruction in the stomach. Undigested food can harden and cause  nausea and vomiting.  Blood glucose fluctuations caused by inconsistent food absorption. DIAGNOSIS  The diagnosis of gastroparesis is confirmed through one or more of the following tests:  Barium X-rays and scans. These tests look at how long it takes for food to move through the stomach.  Gastric manometry. This test measures electrical and muscular activity in the stomach. A thin tube is passed down the throat into the stomach. The tube contains a wire that takes measurements of the stomach's electrical and muscular activity as it digests liquids and solid food.  Endoscopy. This procedure is done with a long, thin tube called an endoscope. It is passed through the mouth and gently down the esophagus into the stomach. This tube helps the caregiver look at the lining of the stomach to check for any abnormalities.  Ultrasonography. This can rule out gallbladder disease or pancreatitis. This test will outline and define the shape of the gallbladder and pancreas. TREATMENT   Treatments may include:  Exercise.  Medicines to control nausea and vomiting.  Medicines to stimulate stomach muscles.  Changes in what and when you eat.  Having smaller meals more often.  Eating low-fiber forms of high-fiber foods, such as eating cooked vegetables instead of raw vegetables.  Eating low-fat foods.  Consuming liquids, which are easier to digest.  In severe cases, feeding tubes and intravenous (IV) feeding may  be needed. It is important to note that in most cases, treatment does not cure gastroparesis. It is usually a lasting (chronic) condition. Treatment helps you manage the underlying condition so that you can be as healthy and comfortable as possible. Other treatments  A gastric neurostimulator has been developed to assist people with gastroparesis. The battery-operated device is surgically implanted. It emits mild electrical pulses to help improve stomach emptying and to control nausea and  vomiting.  The use of botulinum toxin has been shown to improve stomach emptying by decreasing the prolonged contractions of the muscle between the stomach and the small intestine (pyloric sphincter). The benefits are temporary. SEEK MEDICAL CARE IF:   You have diabetes and you are having problems keeping your blood glucose in goal range.  You are having nausea, vomiting, bloating, or early feelings of fullness with eating.  Your symptoms do not change with a change in diet. Document Released: 09/08/2005 Document Revised: 01/03/2013 Document Reviewed: 02/15/2009 [facility] Patient Information 2015 [place], [place]. This information is not intended to replace advice given to you by your health care provider. Make sure you discuss any questions you have with your health care provider.

## 2015-03-02 NOTE — ED Provider Notes (Signed)
[facility] Emergency Department Provider Note     Time seen: ----------------------------------------- 8:12 AM on 03/02/2015 -----------------------------------------    I have reviewed the triage vital signs and the nursing notes.   HISTORY  Chief Complaint Chest Pain    HPI [name] is a 54 y.o. female shortly into dialysis, and began having chest pain after vomiting. Patient states she had pain under her left breast, sharp and accompanied by vomiting. Patient reports history of pancreatitis and gastroparesis. Reports blood sugars have been under adequate control. Denies fevers chills or other complaints. Chest pain is sharp as well, nothing made it better, vomiting and it worse. Again this started 30 minutes into dialysis.   Past Medical History  Diagnosis Date  . Asthma   . Collagen vascular disease   . Diabetes mellitus without complication   . Hypertension   . Renal insufficiency dialysis     3 days weekly mon wed friday  . Coronary artery disease     stent  . Diabetic neuropathy   . ESRD (end stage renal disease) on dialysis     M-W-F  . GERD (gastroesophageal reflux disease)   . Hx of pancreatitis 2015  . Myocardial infarction   . Pneumonia     Patient Active Problem List   Diagnosis Date Noted  . Gastroparesis 02/24/2015  . Pleural effusion 02/19/2015  . HCAP (healthcare-associated pneumonia) 02/19/2015  . End-stage renal disease on hemodialysis 02/19/2015    Past Surgical History  Procedure Laterality Date  . Cholecystectomy    . Appendectomy    . Abdominal hysterectomy      Allergies Ace inhibitors; Codeine; Compazine; Oxycodone; and Tape  Social History History  Substance Use Topics  . Smoking status: Former Smoker -- 0.50 packs/day    Types: Cigarettes    Quit date: 02/13/2015  . Smokeless tobacco: Never Used  . Alcohol Use: No    Review of Systems Constitutional: Negative for fever. Eyes: Negative for  visual changes. ENT: Negative for sore throat. Cardiovascular: Positive for chest pain Respiratory: Negative for shortness of breath. Gastrointestinal: Positive abdominal pain and vomiting Genitourinary: Negative for dysuria. Musculoskeletal: Negative for back pain. Skin: Negative for rash. Neurological: Negative for headaches, focal weakness or numbness.  10-point ROS otherwise negative.  ____________________________________________   PHYSICAL EXAM:  VITAL SIGNS: ED Triage Vitals  Enc Vitals Group     BP --      Pulse --      Resp --      Temp --      Temp src --      SpO2 --      Weight --      Height --      Head Cir --      Peak Flow --      Pain Score --      Pain Loc --      Pain Edu? --      Excl. in [place]? --    Constitutional: Alert and oriented. Anxious in mild distress Eyes: Conjunctivae are normal. PERRL. Normal extraocular movements. ENT   Head: Normocephalic and atraumatic.   Nose: No congestion/rhinnorhea.   Mouth/Throat: Mucous membranes are moist.   Neck: No stridor. Hematological/Lymphatic/Immunilogical: No cervical lymphadenopathy. Cardiovascular: Normal rate, regular rhythm. Normal and symmetric distal pulses are present in all extremities. No murmurs, rubs, or gallops. Respiratory: Normal respiratory effort without tachypnea nor retractions. Breath sounds are clear and equal bilaterally. No wheezes/rales/rhonchi. Gastrointestinal: Epigastric tenderness, no rebound  or guarding. Normal bowel sounds. Musculoskeletal: Nontender with normal range of motion in all extremities. No joint effusions.  No lower extremity tenderness nor edema. Neurologic:  Normal speech and language. No gross focal neurologic deficits are appreciated. Speech is normal. No gait instability. Skin:  Skin is warm, dry and intact. No rash noted. Psychiatric: Mood and affect are normal. Speech and behavior are normal. Patient exhibits appropriate insight and  judgment. ____________________________________________  EKG: Interpreted by me. Normal sinus rhythm, rate 84, normal PR interval, normal QRS with, normal QT interval. Normal axis. No evidence of acute infarction   ____________________________________________  ED COURSE:  Pertinent labs & imaging results that were available during my care of the patient were reviewed by me and considered in my medical decision making (see chart for details). We'll check both chest and abdominal labs. Likely gastroparesis related symptoms ____________________________________________    LABS (pertinent positives/negatives)  Labs Reviewed  COMPREHENSIVE METABOLIC PANEL - Abnormal; Notable for the following:    Potassium 3.1 (*)    Chloride 98 (*)    Creatinine, Ser 4.60 (*)    Calcium 8.5 (*)    GFR calc non Af Amer 10 (*)    GFR calc Af Amer 11 (*)    All other components within normal limits  LIPASE, BLOOD - Abnormal; Notable for the following:    Lipase 21 (*)    All other components within normal limits  TROPONIN I  CBC WITH DIFFERENTIAL/PLATELET    RADIOLOGY  Acute abdominal series IMPRESSION: 1. Pulmonary edema and small pleural effusions. 2. Left lower lobe pneumonia or atelectasis 3. No acute intra-abdominal findings.  ____________________________________________  FINAL ASSESSMENT AND PLAN  Chest pain, vomiting, abdominal pain  Plan: Patient discussed with nephrology, stable for outpatient follow-up. Likely some gastroparesis which she has a history of. We'll also cover for the left lower lobe atelectasis or pneumonia. Patient agrees with plan, symptoms have resolved she's tolerating by mouth well now.   Earleen Newport, MD   Earleen Newport, MD 03/02/15 [phone]

## 2015-03-05 ENCOUNTER — Inpatient Hospital Stay
Admission: EM | Admit: 2015-03-05 | Discharge: 2015-03-09 | DRG: 073 | Disposition: A | Discharge status: Home / Self Care | Payer: Medicare Other | Attending: Specialist | Admitting: Specialist

## 2015-03-05 ENCOUNTER — Encounter: Payer: Self-pay | Admitting: Emergency Medicine

## 2015-03-05 DIAGNOSIS — I5032 Chronic diastolic (congestive) heart failure: Secondary | ICD-10-CM | POA: Diagnosis present

## 2015-03-05 DIAGNOSIS — R1110 Vomiting, unspecified: Secondary | ICD-10-CM

## 2015-03-05 DIAGNOSIS — E1129 Type 2 diabetes mellitus with other diabetic kidney complication: Secondary | ICD-10-CM

## 2015-03-05 DIAGNOSIS — J45909 Unspecified asthma, uncomplicated: Secondary | ICD-10-CM | POA: Diagnosis present

## 2015-03-05 DIAGNOSIS — E1143 Type 2 diabetes mellitus with diabetic autonomic (poly)neuropathy: Principal | ICD-10-CM | POA: Diagnosis present

## 2015-03-05 DIAGNOSIS — R109 Unspecified abdominal pain: Secondary | ICD-10-CM | POA: Diagnosis present

## 2015-03-05 DIAGNOSIS — K219 Gastro-esophageal reflux disease without esophagitis: Secondary | ICD-10-CM | POA: Diagnosis present

## 2015-03-05 DIAGNOSIS — F329 Major depressive disorder, single episode, unspecified: Secondary | ICD-10-CM | POA: Diagnosis present

## 2015-03-05 DIAGNOSIS — Z87891 Personal history of nicotine dependence: Secondary | ICD-10-CM

## 2015-03-05 DIAGNOSIS — I2510 Atherosclerotic heart disease of native coronary artery without angina pectoris: Secondary | ICD-10-CM | POA: Diagnosis present

## 2015-03-05 DIAGNOSIS — R1310 Dysphagia, unspecified: Secondary | ICD-10-CM | POA: Diagnosis present

## 2015-03-05 DIAGNOSIS — K920 Hematemesis: Secondary | ICD-10-CM | POA: Diagnosis present

## 2015-03-05 DIAGNOSIS — E119 Type 2 diabetes mellitus without complications: Secondary | ICD-10-CM

## 2015-03-05 DIAGNOSIS — Z992 Dependence on renal dialysis: Secondary | ICD-10-CM

## 2015-03-05 DIAGNOSIS — F419 Anxiety disorder, unspecified: Secondary | ICD-10-CM | POA: Diagnosis present

## 2015-03-05 DIAGNOSIS — I120 Hypertensive chronic kidney disease with stage 5 chronic kidney disease or end stage renal disease: Secondary | ICD-10-CM | POA: Diagnosis present

## 2015-03-05 DIAGNOSIS — Z7982 Long term (current) use of aspirin: Secondary | ICD-10-CM

## 2015-03-05 DIAGNOSIS — D631 Anemia in chronic kidney disease: Secondary | ICD-10-CM | POA: Diagnosis present

## 2015-03-05 DIAGNOSIS — E11319 Type 2 diabetes mellitus with unspecified diabetic retinopathy without macular edema: Secondary | ICD-10-CM | POA: Diagnosis present

## 2015-03-05 DIAGNOSIS — K3184 Gastroparesis: Secondary | ICD-10-CM | POA: Diagnosis present

## 2015-03-05 DIAGNOSIS — K2970 Gastritis, unspecified, without bleeding: Secondary | ICD-10-CM | POA: Diagnosis present

## 2015-03-05 DIAGNOSIS — N186 End stage renal disease: Secondary | ICD-10-CM

## 2015-03-05 DIAGNOSIS — J449 Chronic obstructive pulmonary disease, unspecified: Secondary | ICD-10-CM | POA: Diagnosis present

## 2015-03-05 DIAGNOSIS — E11649 Type 2 diabetes mellitus with hypoglycemia without coma: Secondary | ICD-10-CM

## 2015-03-05 DIAGNOSIS — I252 Old myocardial infarction: Secondary | ICD-10-CM

## 2015-03-05 DIAGNOSIS — J9811 Atelectasis: Secondary | ICD-10-CM | POA: Diagnosis present

## 2015-03-05 DIAGNOSIS — Z955 Presence of coronary angioplasty implant and graft: Secondary | ICD-10-CM

## 2015-03-05 DIAGNOSIS — F451 Undifferentiated somatoform disorder: Secondary | ICD-10-CM

## 2015-03-05 DIAGNOSIS — Z794 Long term (current) use of insulin: Secondary | ICD-10-CM

## 2015-03-05 DIAGNOSIS — I10 Essential (primary) hypertension: Secondary | ICD-10-CM | POA: Diagnosis present

## 2015-03-05 DIAGNOSIS — R112 Nausea with vomiting, unspecified: Secondary | ICD-10-CM | POA: Diagnosis present

## 2015-03-05 HISTORY — DX: Nonrheumatic mitral (valve) insufficiency: I34.0

## 2015-03-05 HISTORY — DX: Chronic obstructive pulmonary disease, unspecified: J44.9

## 2015-03-05 LAB — COMPREHENSIVE METABOLIC PANEL
ALT: 22 U/L (ref 14–54)
AST: 38 U/L (ref 15–41)
Albumin: 3.5 g/dL (ref 3.5–5.0)
Alkaline Phosphatase: 100 U/L (ref 38–126)
Anion gap: 15 (ref 5–15)
BUN: 25 mg/dL — ABNORMAL HIGH (ref 6–20)
CO2: 28 mmol/L (ref 22–32)
Calcium: 8.8 mg/dL — ABNORMAL LOW (ref 8.9–10.3)
Chloride: 97 mmol/L — ABNORMAL LOW (ref 101–111)
Creatinine, Ser: 6.01 mg/dL — ABNORMAL HIGH (ref 0.44–1.00)
GFR calc Af Amer: 8 mL/min — ABNORMAL LOW (ref >60)
GFR calc non Af Amer: 7 mL/min — ABNORMAL LOW (ref >60)
Glucose, Bld: 107 mg/dL — ABNORMAL HIGH (ref 65–99)
Potassium: 3.9 mmol/L (ref 3.5–5.1)
Sodium: 140 mmol/L (ref 135–145)
Total Bilirubin: 0.9 mg/dL (ref 0.3–1.2)
Total Protein: 6.9 g/dL (ref 6.5–8.1)

## 2015-03-05 LAB — CBC WITH DIFFERENTIAL/PLATELET
Band Neutrophils: 0 % (ref 0–10)
Basophils Absolute: 0.1 10*3/uL (ref 0.0–0.1)
Basophils Relative: 1 % (ref 0–1)
Blasts: 0 %
Eosinophils Absolute: 0.3 10*3/uL (ref 0.0–0.7)
Eosinophils Relative: 5 % (ref 0–5)
HCT: 32.1 % — ABNORMAL LOW (ref 35.0–47.0)
Hemoglobin: 10.1 g/dL — ABNORMAL LOW (ref 12.0–16.0)
Lymphocytes Relative: 31 % (ref 12–46)
Lymphs Abs: 1.6 10*3/uL (ref 0.7–4.0)
MCH: 26.7 pg (ref 26.0–34.0)
MCHC: 31.3 g/dL — ABNORMAL LOW (ref 32.0–36.0)
MCV: 85.2 fL (ref 80.0–100.0)
Metamyelocytes Relative: 0 %
Monocytes Absolute: 0.3 10*3/uL (ref 0.1–1.0)
Monocytes Relative: 5 % (ref 3–12)
Myelocytes: 0 %
Neutro Abs: 2.9 10*3/uL (ref 1.7–7.7)
Neutrophils Relative %: 58 % (ref 43–77)
Other: 0 %
Platelets: 119 10*3/uL — ABNORMAL LOW (ref 150–440)
Promyelocytes Absolute: 0 %
RBC: 3.77 MIL/uL — ABNORMAL LOW (ref 3.80–5.20)
RDW: 21.1 % — ABNORMAL HIGH (ref 11.5–14.5)
WBC: 5.2 10*3/uL (ref 3.6–11.0)
nRBC: 0 /100 WBC

## 2015-03-05 LAB — TROPONIN I: Troponin I: <0.03 ng/mL (ref <0.031)

## 2015-03-05 LAB — LIPASE, BLOOD: Lipase: 19 U/L — ABNORMAL LOW (ref 22–51)

## 2015-03-05 MED ORDER — PROMETHAZINE HCL 25 MG/ML IJ SOLN
INTRAMUSCULAR | Status: AC
  Administered 2015-03-05: 12.5 mg via INTRAVENOUS
  Filled 2015-03-05: qty 1

## 2015-03-05 MED ORDER — MORPHINE SULFATE 4 MG/ML IJ SOLN
4.0000 mg | Freq: Once | INTRAMUSCULAR | Status: AC
  Administered 2015-03-05: 4 mg via INTRAVENOUS

## 2015-03-05 MED ORDER — MORPHINE SULFATE 4 MG/ML IJ SOLN
INTRAMUSCULAR | Status: AC
Start: 2015-03-05 — End: 2015-03-05
  Administered 2015-03-05: 4 mg via INTRAVENOUS
  Filled 2015-03-05: qty 1

## 2015-03-05 MED ORDER — PROMETHAZINE HCL 25 MG/ML IJ SOLN
12.5000 mg | Freq: Once | INTRAMUSCULAR | Status: AC
  Administered 2015-03-05: 12.5 mg via INTRAVENOUS

## 2015-03-05 NOTE — ED Provider Notes (Signed)
[facility] Emergency Department Provider Note  ____________________________________________  Time seen: Approximately 11:16 PM  I have reviewed the triage vital signs and the nursing notes.   HISTORY  Chief Complaint Emesis and Abdominal Pain    HPI [name] is a 54 y.o. female who comes in today reporting that she has been vomiting and feeling achy all over. The patient reports that she was admitted to the hospital and discharged approximately 3 days ago. The patient reports that she was on Zofran but was having symptoms of dementia so they switched her to Phenergan. The patient reports that she started aching all over and it started earlier tonight. She reports that she started on the Phenergan also 3 days ago. The patient reports that she had not had a bowel movement since she was discharged until this morning. The patient was admitted with pneumonia and gastroparesis. The patient reports initially she was vomiting for 2 days but then reports that she was vomiting for 2 weeks. She has been taking Tylenol for her body aches but reports it is not helping. She cannot keep anything down and vomits up even water. The patient denies any chest pain but has had some shortness of breath and cough nausea vomiting swelling in her legs myalgias and abdominal pain. She reports that the last time this happened to her she had a potassium that was greater than 9. The patient reports that her last dialysis was in the hospital and she did not go to dialysis today.He reports that her pain as a 9 out of 10 in intensity and reports it is all over her body.   Past Medical History  Diagnosis Date  . Asthma   . Collagen vascular disease   . Diabetes mellitus without complication   . Hypertension   . Renal insufficiency dialysis     3 days weekly mon wed friday  . Coronary artery disease     stent  . Diabetic neuropathy   . ESRD (end stage renal disease) on dialysis     M-W-F  .  GERD (gastroesophageal reflux disease)   . Hx of pancreatitis 2015  . Myocardial infarction   . Pneumonia   . Dialysis patient     M,W,F    Patient Active Problem List   Diagnosis Date Noted  . Nausea & vomiting 03/06/2015  . Abdominal pain 03/06/2015  . DM (diabetes mellitus) 03/06/2015  . HTN (hypertension) 03/06/2015  . Gastroparesis 02/24/2015  . Pleural effusion 02/19/2015  . HCAP (healthcare-associated pneumonia) 02/19/2015  . End-stage renal disease on hemodialysis 02/19/2015    Past Surgical History  Procedure Laterality Date  . Cholecystectomy    . Appendectomy    . Abdominal hysterectomy    . Dialysis fistula creation Left     upper arm    Current Outpatient Rx  Name  Route  Sig  Dispense  Refill  . albuterol (PROVENTIL HFA;VENTOLIN HFA) 108 (90 BASE) MCG/ACT inhaler      Inhale 4-6 puffs by mouth every 4 hours as needed for wheezing, cough, and/or shortness of breath   1 Inhaler   1   . amLODipine (NORVASC) 10 MG tablet   Oral   Take 10 mg by mouth daily.         Marland Kitchen aspirin EC 81 MG tablet   Oral   Take 81 mg by mouth daily.         Marland Kitchen atorvastatin (LIPITOR) 40 MG tablet   Oral  Take 40 mg by mouth daily.         Marland Kitchen buPROPion (WELLBUTRIN SR) 150 MG 12 hr tablet   Oral   Take 150 mg by mouth 2 (two) times daily.         . carvedilol (COREG) 25 MG tablet   Oral   Take 25 mg by mouth 2 (two) times daily.         . cetirizine (ZYRTEC) 10 MG tablet   Oral   Take 10 mg by mouth daily.         . citalopram (CELEXA) 20 MG tablet   Oral   Take 20 mg by mouth daily.         . clopidogrel (PLAVIX) 75 MG tablet   Oral   Take 75 mg by mouth daily.         . Ergocalciferol (VITAMIN D2) 400 UNITS TABS   Oral   Take 2 tablets by mouth daily.          Marland Kitchen gabapentin (NEURONTIN) 100 MG capsule   Oral   Take 100 mg by mouth daily.         Marland Kitchen HYDROcodone-acetaminophen (NORCO/VICODIN) 5-325 MG per tablet   Oral   Take 1 tablet by  mouth every 6 (six) hours as needed for moderate pain.   20 tablet   0   . hydrocortisone 1 % ointment   Topical   Apply 1 application topically 2 (two) times daily.         . insulin aspart (NOVOLOG) 100 UNIT/ML injection   Subcutaneous   Inject 2-6 Units into the skin 3 (three) times daily with meals. Pt uses as needed per sliding scale.         . insulin detemir (LEVEMIR) 100 UNIT/ML injection   Subcutaneous   Inject 8 Units into the skin at bedtime.         Marland Kitchen levofloxacin (LEVAQUIN) 500 MG tablet   Oral   Take 1 tablet (500 mg total) by mouth every other day.   5 tablet   0   . lipase/protease/amylase (CREON) 12000 UNITS CPEP capsule   Oral   Take 24,000-48,000 Units by mouth 5 (five) times daily. Pt takes four capsules with meals and two capsules with snacks.         . metoCLOPramide (REGLAN) 5 MG tablet   Oral   Take 1 tablet (5 mg total) by mouth 4 (four) times daily -  before meals and at bedtime.   40 tablet   0   . nitroGLYCERIN (NITROSTAT) 0.4 MG SL tablet   Sublingual   Place 0.4 mg under the tongue every 5 (five) minutes as needed for chest pain.         . Omega-3 Fatty Acids (FISH OIL) 1000 MG CAPS   Oral   Take 1 capsule by mouth 2 (two) times daily.         . ondansetron (ZOFRAN) 4 MG tablet   Oral   Take 1 tablet (4 mg total) by mouth daily as needed for nausea or vomiting.   20 tablet   1   . pantoprazole (PROTONIX) 40 MG tablet   Oral   Take 1 tablet (40 mg total) by mouth 2 (two) times daily before a meal.   60 tablet   0   . ranitidine (ZANTAC) 150 MG tablet   Oral   Take 150 mg by mouth 2 (two) times daily.         Marland Kitchen  ranolazine (RANEXA) 1000 MG SR tablet   Oral   Take 1,000 mg by mouth 2 (two) times daily.          . sevelamer carbonate (RENVELA) 800 MG tablet   Oral   Take 800 mg by mouth 3 (three) times daily with meals.         . sucralfate (CARAFATE) 1 G tablet   Oral   Take 1 tablet (1 g total) by mouth 4  (four) times daily -  with meals and at bedtime.   120 tablet   0     Allergies Ace inhibitors; Codeine; Compazine; Zofran; Oxycodone; and Tape  Family History  Problem Relation Age of Onset  . Kidney disease Mother   . Diabetes Mother     Social History History  Substance Use Topics  . Smoking status: Former Smoker -- 0.50 packs/day    Types: Cigarettes    Quit date: 02/13/2015  . Smokeless tobacco: Never Used  . Alcohol Use: No    Review of Systems Constitutional: No fever/chills Eyes: No visual changes. ENT: No sore throat. Cardiovascular: Denies chest pain. Respiratory:  shortness of breath and cough Gastrointestinal: Abdominal pain, nausea and vomiting Genitourinary: On dialysis Musculoskeletal: Diffuse myalgias Skin: Negative for rash. Neurological: Negative for headaches, Hematological/Lymphatic:Bilateral lower extremity swelling 10-point ROS otherwise negative.  ____________________________________________   PHYSICAL EXAM:  VITAL SIGNS: ED Triage Vitals  Enc Vitals Group     BP 03/05/15 2159 192/80 mmHg     Pulse Rate 03/05/15 2159 61     Resp 03/05/15 2159 18     Temp 03/05/15 2159 97.4 F (36.3 C)     Temp Source 03/05/15 2159 Oral     SpO2 03/05/15 2159 96 %     Weight 03/05/15 2159 192 lb 14.4 oz (87.499 kg)     Height 03/05/15 2159 5\' 5"  (1.651 m)     Head Cir --      Peak Flow --      Pain Score 03/05/15 2200 9     Pain Loc --      Pain Edu? --      Excl. in [place]? --     Constitutional: Alert and oriented. Well appearing and in moderate distress. Eyes: Conjunctivae are normal. PERRL. EOMI. Head: Atraumatic. Nose: No congestion/rhinnorhea. Mouth/Throat: Mucous membranes are moist.  Oropharynx non-erythematous. Cardiovascular: Normal rate, regular rhythm. Grossly normal heart sounds.  Good peripheral circulation. Respiratory: Normal respiratory effort.  No retractions. Lungs CTAB anteriorly Gastrointestinal: Soft use tenderness to  palpation, positive bowel sounds Genitourinary: Deferred Musculoskeletal: Bilateral lower extremity edema Neurologic:  Normal speech and language. No gross focal neurologic deficits are appreciated.  Skin:  Skin is warm, dry and intact. No rash noted. Psychiatric: Mood and affect are normal.   ____________________________________________   LABS (all labs ordered are listed, but only abnormal results are displayed)  Labs Reviewed  CBC WITH DIFFERENTIAL/PLATELET - Abnormal; Notable for the following:    RBC 3.77 (*)    Hemoglobin 10.1 (*)    HCT 32.1 (*)    MCHC 31.3 (*)    RDW 21.1 (*)    Platelets 119 (*)    All other components within normal limits  COMPREHENSIVE METABOLIC PANEL - Abnormal; Notable for the following:    Chloride 97 (*)    Glucose, Bld 107 (*)    BUN 25 (*)    Creatinine, Ser 6.01 (*)    Calcium 8.8 (*)    GFR calc non Af  Amer 7 (*)    GFR calc Af Amer 8 (*)    All other components within normal limits  LIPASE, BLOOD - Abnormal; Notable for the following:    Lipase 19 (*)    All other components within normal limits  URINALYSIS COMPLETEWITH MICROSCOPIC ([facility] ONLY) - Abnormal; Notable for the following:    Color, Urine YELLOW (*)    APPearance CLEAR (*)    Glucose, UA 50 (*)    Hgb urine dipstick 1+ (*)    pH 9.0 (*)    Protein, ur 100 (*)    Squamous Epithelial / LPF 0-5 (*)    All other components within normal limits  TROPONIN I  CK   ____________________________________________  EKG  ED ECG REPORT I, Loney Hering, the attending physician, personally viewed and interpreted this ECG.   Date: 03/05/2015  EKG Time: 2345  Rate: 66  Rhythm: normal sinus rhythm  Axis: Normal  Intervals:none  ST&T Change: Lots of artifact with no significant ST segment elevation  ____________________________________________  RADIOLOGY  Chest x-ray: Cardiac enlargement with pulmonary vascular congestion and mild perihilar edema, bilateral pleural  effusions and basilar atelectasis KUB: Negative ____________________________________________   PROCEDURES  Procedure(s) performed: None  Critical Care performed: No  ____________________________________________   INITIAL IMPRESSION / ASSESSMENT AND PLAN / ED COURSE  Pertinent labs & imaging results that were available during my care of the patient were reviewed by me and considered in my medical decision making (see chart for details).  This is a 54 year old female who was recently seen in the hospital and admitted with gastroparesis, vomiting and pneumonia. I will reevaluate and go through the patient's previous notes as well as her hospital note. I will give the patient a dose of Phenergan for the vomiting and reassess the patient's symptoms. I will also check a CK for possible rhabdomyolysis.  ----------------------------------------- 6:43 AM on 03/06/2015 -----------------------------------------  After reviewing the patient's previous notes it has been noted that the patient has had chronic abdominal pain and vomiting. The patient was seen on June 2 with similar complaints and had a CT scan that was unremarkable. I did give the patient 2 doses of morphine as well as Phenergan for her pain and nausea. The patient was sleeping comfortably without any episodes of vomiting but when I attempted to give the patient some water to see if she could keep that down and she did vomit again. I informed the patient that I did not want to repeat a CT scan as she has had these symptoms chronically I also informed the patient that I will admit her to the hospital since she is unable to keep any liquids or her medications down. The patient while she resisted initial admission she does understand the need for admission at this time. I contacted the admitting physician who will admit the patient to the hospitalist service. ____________________________________________   FINAL CLINICAL IMPRESSION(S) / ED  DIAGNOSES  Final diagnoses:  Intractable vomiting with nausea, vomiting of unspecified type      Loney Hering, MD 03/06/15 [phone]

## 2015-03-05 NOTE — ED Notes (Signed)
Pt here from home via [facility] with c/o vomiting x2 weeks; pt seen here for the same recently, sent home with zofran (causes hallucinations) and phenergan and unable to keep either down. Pt is dialysis patient (did not go to dialysis today). EMS reports CBG 102; BP 160/70, HR 60. Pt also reports diffuse abdominal pain x2 hours.

## 2015-03-06 ENCOUNTER — Emergency Department: Payer: Medicare Other

## 2015-03-06 ENCOUNTER — Encounter: Payer: Self-pay | Admitting: Internal Medicine

## 2015-03-06 DIAGNOSIS — Z955 Presence of coronary angioplasty implant and graft: Secondary | ICD-10-CM | POA: Diagnosis not present

## 2015-03-06 DIAGNOSIS — E11319 Type 2 diabetes mellitus with unspecified diabetic retinopathy without macular edema: Secondary | ICD-10-CM | POA: Diagnosis present

## 2015-03-06 DIAGNOSIS — F419 Anxiety disorder, unspecified: Secondary | ICD-10-CM | POA: Diagnosis present

## 2015-03-06 DIAGNOSIS — R112 Nausea with vomiting, unspecified: Secondary | ICD-10-CM | POA: Diagnosis not present

## 2015-03-06 DIAGNOSIS — F329 Major depressive disorder, single episode, unspecified: Secondary | ICD-10-CM | POA: Diagnosis present

## 2015-03-06 DIAGNOSIS — E1143 Type 2 diabetes mellitus with diabetic autonomic (poly)neuropathy: Secondary | ICD-10-CM | POA: Diagnosis present

## 2015-03-06 DIAGNOSIS — E11649 Type 2 diabetes mellitus with hypoglycemia without coma: Secondary | ICD-10-CM

## 2015-03-06 DIAGNOSIS — I252 Old myocardial infarction: Secondary | ICD-10-CM | POA: Diagnosis not present

## 2015-03-06 DIAGNOSIS — R109 Unspecified abdominal pain: Secondary | ICD-10-CM | POA: Diagnosis present

## 2015-03-06 DIAGNOSIS — Z794 Long term (current) use of insulin: Secondary | ICD-10-CM | POA: Diagnosis not present

## 2015-03-06 DIAGNOSIS — I10 Essential (primary) hypertension: Secondary | ICD-10-CM | POA: Diagnosis present

## 2015-03-06 DIAGNOSIS — E119 Type 2 diabetes mellitus without complications: Secondary | ICD-10-CM

## 2015-03-06 DIAGNOSIS — R1310 Dysphagia, unspecified: Secondary | ICD-10-CM | POA: Diagnosis present

## 2015-03-06 DIAGNOSIS — Z992 Dependence on renal dialysis: Secondary | ICD-10-CM | POA: Diagnosis not present

## 2015-03-06 DIAGNOSIS — K219 Gastro-esophageal reflux disease without esophagitis: Secondary | ICD-10-CM | POA: Diagnosis present

## 2015-03-06 DIAGNOSIS — Z7982 Long term (current) use of aspirin: Secondary | ICD-10-CM | POA: Diagnosis not present

## 2015-03-06 DIAGNOSIS — J449 Chronic obstructive pulmonary disease, unspecified: Secondary | ICD-10-CM | POA: Diagnosis present

## 2015-03-06 DIAGNOSIS — K920 Hematemesis: Secondary | ICD-10-CM | POA: Diagnosis present

## 2015-03-06 DIAGNOSIS — E1129 Type 2 diabetes mellitus with other diabetic kidney complication: Secondary | ICD-10-CM

## 2015-03-06 DIAGNOSIS — J9811 Atelectasis: Secondary | ICD-10-CM | POA: Diagnosis present

## 2015-03-06 DIAGNOSIS — K2970 Gastritis, unspecified, without bleeding: Secondary | ICD-10-CM | POA: Diagnosis present

## 2015-03-06 DIAGNOSIS — E1159 Type 2 diabetes mellitus with other circulatory complications: Secondary | ICD-10-CM | POA: Diagnosis not present

## 2015-03-06 DIAGNOSIS — Z87891 Personal history of nicotine dependence: Secondary | ICD-10-CM | POA: Diagnosis not present

## 2015-03-06 DIAGNOSIS — I120 Hypertensive chronic kidney disease with stage 5 chronic kidney disease or end stage renal disease: Secondary | ICD-10-CM | POA: Diagnosis present

## 2015-03-06 DIAGNOSIS — K3184 Gastroparesis: Secondary | ICD-10-CM | POA: Diagnosis present

## 2015-03-06 DIAGNOSIS — N186 End stage renal disease: Secondary | ICD-10-CM | POA: Diagnosis present

## 2015-03-06 DIAGNOSIS — D631 Anemia in chronic kidney disease: Secondary | ICD-10-CM | POA: Diagnosis present

## 2015-03-06 DIAGNOSIS — I2510 Atherosclerotic heart disease of native coronary artery without angina pectoris: Secondary | ICD-10-CM | POA: Diagnosis present

## 2015-03-06 DIAGNOSIS — J45909 Unspecified asthma, uncomplicated: Secondary | ICD-10-CM | POA: Diagnosis present

## 2015-03-06 DIAGNOSIS — R1110 Vomiting, unspecified: Secondary | ICD-10-CM | POA: Diagnosis present

## 2015-03-06 DIAGNOSIS — I5032 Chronic diastolic (congestive) heart failure: Secondary | ICD-10-CM | POA: Diagnosis present

## 2015-03-06 LAB — GLUCOSE, CAPILLARY
Glucose-Capillary: 62 mg/dL — ABNORMAL LOW (ref 65–99)
Glucose-Capillary: 64 mg/dL — ABNORMAL LOW (ref 65–99)

## 2015-03-06 LAB — URINALYSIS COMPLETE WITH MICROSCOPIC (ARMC ONLY)
Bacteria, UA: NONE SEEN
Bilirubin Urine: NEGATIVE
Glucose, UA: 50 mg/dL — AB
Ketones, ur: NEGATIVE mg/dL
Leukocytes, UA: NEGATIVE
Nitrite: NEGATIVE
Protein, ur: 100 mg/dL — AB
Specific Gravity, Urine: 1.01 (ref 1.005–1.030)
pH: 9 — ABNORMAL HIGH (ref 5.0–8.0)

## 2015-03-06 LAB — CK: Total CK: 56 U/L (ref 38–234)

## 2015-03-06 MED ORDER — CHOLECALCIFEROL 10 MCG (400 UNIT) PO TABS
800.0000 [IU] | ORAL_TABLET | Freq: Every day | ORAL | Status: DC
  Administered 2015-03-06 – 2015-03-09 (×3): 800 [IU] via ORAL
  Filled 2015-03-06 (×4): qty 2

## 2015-03-06 MED ORDER — BUPROPION HCL ER (SR) 150 MG PO TB12
150.0000 mg | ORAL_TABLET | Freq: Two times a day (BID) | ORAL | Status: DC
  Administered 2015-03-06 – 2015-03-08 (×5): 150 mg via ORAL
  Filled 2015-03-06 (×5): qty 1

## 2015-03-06 MED ORDER — FUROSEMIDE 10 MG/ML IJ SOLN: 20.0000 mg | Freq: Once | INTRAMUSCULAR | Status: DC

## 2015-03-06 MED ORDER — PANCRELIPASE (LIP-PROT-AMYL) 12000-38000 UNITS PO CPEP: 24000.0000 [IU] | ORAL_CAPSULE | Freq: Every day | ORAL | Status: DC

## 2015-03-06 MED ORDER — AMLODIPINE BESYLATE 10 MG PO TABS
10.0000 mg | ORAL_TABLET | Freq: Every day | ORAL | Status: DC
  Administered 2015-03-06 – 2015-03-09 (×4): 10 mg via ORAL
  Filled 2015-03-06 (×4): qty 1

## 2015-03-06 MED ORDER — HEPARIN SODIUM (PORCINE) 5000 UNIT/ML IJ SOLN
5000.0000 [IU] | Freq: Three times a day (TID) | INTRAMUSCULAR | Status: DC
Start: 2015-03-06 — End: 2015-03-09
  Administered 2015-03-06 – 2015-03-09 (×9): 5000 [IU] via SUBCUTANEOUS
  Filled 2015-03-06 (×9): qty 1

## 2015-03-06 MED ORDER — CITALOPRAM HYDROBROMIDE 20 MG PO TABS
20.0000 mg | ORAL_TABLET | Freq: Every day | ORAL | Status: DC
  Administered 2015-03-06 – 2015-03-09 (×4): 20 mg via ORAL
  Filled 2015-03-06 (×4): qty 1

## 2015-03-06 MED ORDER — PROMETHAZINE HCL 25 MG/ML IJ SOLN
12.5000 mg | Freq: Once | INTRAMUSCULAR | Status: AC
  Administered 2015-03-06: 12.5 mg via INTRAVENOUS

## 2015-03-06 MED ORDER — VITAMIN D2 10 MCG (400 UNIT) PO TABS: 2.0000 | ORAL_TABLET | Freq: Every day | ORAL | Status: DC

## 2015-03-06 MED ORDER — HYDROCODONE-ACETAMINOPHEN 5-325 MG PO TABS
1.0000 | ORAL_TABLET | Freq: Four times a day (QID) | ORAL | Status: DC | PRN
  Administered 2015-03-08: 1 via ORAL
  Filled 2015-03-06: qty 1

## 2015-03-06 MED ORDER — LORATADINE 10 MG PO TABS: 10.0000 mg | ORAL_TABLET | Freq: Every day | ORAL | Status: DC

## 2015-03-06 MED ORDER — SODIUM CHLORIDE 0.9 % IV SOLN
250.0000 mL | INTRAVENOUS | Status: DC | PRN
  Administered 2015-03-08: 250 mL via INTRAVENOUS

## 2015-03-06 MED ORDER — FAMOTIDINE 20 MG PO TABS: 10.0000 mg | ORAL_TABLET | Freq: Every day | ORAL | Status: DC

## 2015-03-06 MED ORDER — METOCLOPRAMIDE HCL 5 MG PO TABS
5.0000 mg | ORAL_TABLET | Freq: Three times a day (TID) | ORAL | Status: DC
  Administered 2015-03-06 – 2015-03-07 (×4): 5 mg via ORAL
  Filled 2015-03-06 (×8): qty 1

## 2015-03-06 MED ORDER — LORATADINE 10 MG PO TABS
10.0000 mg | ORAL_TABLET | ORAL | Status: DC
  Administered 2015-03-08: 10 mg via ORAL
  Filled 2015-03-06: qty 1

## 2015-03-06 MED ORDER — ASPIRIN EC 81 MG PO TBEC: 81.0000 mg | DELAYED_RELEASE_TABLET | Freq: Every day | ORAL | Status: DC

## 2015-03-06 MED ORDER — SEVELAMER CARBONATE 800 MG PO TABS
800.0000 mg | ORAL_TABLET | Freq: Three times a day (TID) | ORAL | Status: DC
  Administered 2015-03-07 – 2015-03-09 (×6): 800 mg via ORAL
  Filled 2015-03-06 (×7): qty 1

## 2015-03-06 MED ORDER — ACETAMINOPHEN 650 MG RE SUPP: 650.0000 mg | Freq: Four times a day (QID) | RECTAL | Status: DC | PRN

## 2015-03-06 MED ORDER — ATORVASTATIN CALCIUM 20 MG PO TABS
40.0000 mg | ORAL_TABLET | Freq: Every day | ORAL | Status: DC
  Administered 2015-03-06 – 2015-03-09 (×4): 40 mg via ORAL
  Filled 2015-03-06 (×4): qty 2

## 2015-03-06 MED ORDER — NITROGLYCERIN 0.4 MG SL SUBL: 0.4000 mg | SUBLINGUAL_TABLET | SUBLINGUAL | Status: DC | PRN

## 2015-03-06 MED ORDER — CLOPIDOGREL BISULFATE 75 MG PO TABS: 75.0000 mg | ORAL_TABLET | Freq: Every day | ORAL | Status: DC

## 2015-03-06 MED ORDER — SODIUM CHLORIDE 0.9 % IJ SOLN
3.0000 mL | Freq: Two times a day (BID) | INTRAMUSCULAR | Status: DC
Start: 2015-03-06 — End: 2015-03-09
  Administered 2015-03-06 – 2015-03-09 (×5): 3 mL via INTRAVENOUS

## 2015-03-06 MED ORDER — PANCRELIPASE (LIP-PROT-AMYL) 12000-38000 UNITS PO CPEP
24000.0000 [IU] | ORAL_CAPSULE | ORAL | Status: DC
  Administered 2015-03-08: 24000 [IU] via ORAL
  Filled 2015-03-06 (×7): qty 2

## 2015-03-06 MED ORDER — PANCRELIPASE (LIP-PROT-AMYL) 12000-38000 UNITS PO CPEP
48000.0000 [IU] | ORAL_CAPSULE | Freq: Three times a day (TID) | ORAL | Status: DC
  Administered 2015-03-07 – 2015-03-09 (×4): 48000 [IU] via ORAL
  Filled 2015-03-06 (×9): qty 4

## 2015-03-06 MED ORDER — MORPHINE SULFATE 4 MG/ML IJ SOLN
4.0000 mg | Freq: Once | INTRAMUSCULAR | Status: AC
  Administered 2015-03-06: 4 mg via INTRAVENOUS

## 2015-03-06 MED ORDER — MORPHINE SULFATE 4 MG/ML IJ SOLN
4.0000 mg | Freq: Once | INTRAMUSCULAR | Status: AC
  Administered 2015-03-06: 4 mg via INTRAVENOUS
  Filled 2015-03-06 (×2): qty 1

## 2015-03-06 MED ORDER — INSULIN ASPART 100 UNIT/ML ~~LOC~~ SOLN
0.0000 [IU] | Freq: Three times a day (TID) | SUBCUTANEOUS | Status: DC
Start: 2015-03-06 — End: 2015-03-09
  Administered 2015-03-09: 2 [IU] via SUBCUTANEOUS
  Filled 2015-03-06: qty 2

## 2015-03-06 MED ORDER — CARVEDILOL 25 MG PO TABS
25.0000 mg | ORAL_TABLET | Freq: Two times a day (BID) | ORAL | Status: DC
  Administered 2015-03-06 – 2015-03-08 (×6): 25 mg via ORAL
  Filled 2015-03-06 (×6): qty 1

## 2015-03-06 MED ORDER — ALBUTEROL SULFATE (2.5 MG/3ML) 0.083% IN NEBU
2.5000 mg | INHALATION_SOLUTION | RESPIRATORY_TRACT | Status: DC | PRN
  Administered 2015-03-06: 2.5 mg via RESPIRATORY_TRACT
  Filled 2015-03-06: qty 3

## 2015-03-06 MED ORDER — PROMETHAZINE HCL 25 MG/ML IJ SOLN
INTRAMUSCULAR | Status: AC
  Administered 2015-03-06: 12.5 mg via INTRAVENOUS
  Filled 2015-03-06: qty 1

## 2015-03-06 MED ORDER — ACETAMINOPHEN 325 MG PO TABS
650.0000 mg | ORAL_TABLET | Freq: Four times a day (QID) | ORAL | Status: DC | PRN
  Administered 2015-03-07 – 2015-03-09 (×3): 650 mg via ORAL
  Filled 2015-03-06 (×2): qty 2

## 2015-03-06 MED ORDER — LABETALOL HCL 5 MG/ML IV SOLN
20.0000 mg | Freq: Four times a day (QID) | INTRAVENOUS | Status: DC | PRN
  Filled 2015-03-06: qty 4

## 2015-03-06 MED ORDER — RANOLAZINE ER 500 MG PO TB12
1000.0000 mg | ORAL_TABLET | Freq: Two times a day (BID) | ORAL | Status: DC
  Administered 2015-03-06 – 2015-03-09 (×7): 1000 mg via ORAL
  Filled 2015-03-06 (×9): qty 2

## 2015-03-06 MED ORDER — MORPHINE SULFATE 4 MG/ML IJ SOLN
INTRAMUSCULAR | Status: AC
  Administered 2015-03-06: 4 mg via INTRAVENOUS
  Filled 2015-03-06: qty 1

## 2015-03-06 MED ORDER — SUCRALFATE 1 G PO TABS
1.0000 g | ORAL_TABLET | Freq: Three times a day (TID) | ORAL | Status: DC
  Administered 2015-03-06 – 2015-03-09 (×8): 1 g via ORAL
  Filled 2015-03-06 (×9): qty 1

## 2015-03-06 MED ORDER — LABETALOL HCL 100 MG PO TABS
100.0000 mg | ORAL_TABLET | Freq: Two times a day (BID) | ORAL | Status: DC
  Administered 2015-03-06 (×2): 100 mg via ORAL
  Filled 2015-03-06 (×5): qty 1

## 2015-03-06 MED ORDER — MORPHINE SULFATE 2 MG/ML IJ SOLN
2.0000 mg | INTRAMUSCULAR | Status: DC | PRN
  Administered 2015-03-06 – 2015-03-07 (×3): 2 mg via INTRAVENOUS
  Filled 2015-03-06 (×4): qty 1

## 2015-03-06 MED ORDER — PROMETHAZINE HCL 25 MG/ML IJ SOLN
12.5000 mg | Freq: Four times a day (QID) | INTRAMUSCULAR | Status: DC | PRN
  Administered 2015-03-06 – 2015-03-07 (×2): 12.5 mg via INTRAVENOUS
  Filled 2015-03-06 (×2): qty 1

## 2015-03-06 MED ORDER — HEPARIN SODIUM (PORCINE) 1000 UNIT/ML DIALYSIS
3000.0000 [IU] | Freq: Once | INTRAMUSCULAR | Status: DC
  Filled 2015-03-06: qty 3

## 2015-03-06 MED ORDER — ALBUTEROL SULFATE HFA 108 (90 BASE) MCG/ACT IN AERS: 2.0000 | INHALATION_SPRAY | RESPIRATORY_TRACT | Status: DC | PRN

## 2015-03-06 MED ORDER — SODIUM CHLORIDE 0.9 % IJ SOLN
3.0000 mL | INTRAMUSCULAR | Status: DC | PRN
  Administered 2015-03-06: 3 mL via INTRAVENOUS
  Filled 2015-03-06: qty 10

## 2015-03-06 MED ORDER — GABAPENTIN 100 MG PO CAPS
100.0000 mg | ORAL_CAPSULE | Freq: Every day | ORAL | Status: DC
  Administered 2015-03-07: 100 mg via ORAL
  Filled 2015-03-06 (×2): qty 1

## 2015-03-06 MED ORDER — PANTOPRAZOLE SODIUM 40 MG PO TBEC
40.0000 mg | DELAYED_RELEASE_TABLET | Freq: Two times a day (BID) | ORAL | Status: DC
  Administered 2015-03-07 – 2015-03-09 (×4): 40 mg via ORAL
  Filled 2015-03-06 (×5): qty 1

## 2015-03-06 MED ORDER — LABETALOL HCL 5 MG/ML IV SOLN: 20.0000 mg | Freq: Four times a day (QID) | INTRAVENOUS | Status: DC | PRN

## 2015-03-06 NOTE — Progress Notes (Signed)
Initial Nutrition Assessment  DOCUMENTATION CODES:       NUTRITION DIAGNOSIS:  Inadequate oral intake related to altered GI function as evidenced by NPO status.    GOAL:   (Goal would be for diet to be progressed within 5-7 days)    MONITOR:   (Energy intake, Electrolyte and renal profile, Glucose profile)  REASON FOR ASSESSMENT:  Malnutrition Screening Tool    ASSESSMENT:  Pt admitted with nausea, vomiting. GI consulted  PMHx:  Past Medical History  Diagnosis Date  . Asthma   . Collagen vascular disease   . Diabetes mellitus without complication   . Hypertension   . Renal insufficiency dialysis     3 days weekly mon wed friday  . Coronary artery disease     stent  . Diabetic neuropathy   . ESRD (end stage renal disease) on dialysis     M-W-F  . GERD (gastroesophageal reflux disease)   . Hx of pancreatitis 2015  . Myocardial infarction   . Pneumonia   . Dialysis patient     M,W,F    Current Nutrition: NPO   Nutrition Prior to Admission Pt reports appetite has been decreased for the past 2 weeks, eating 1 meal per day or less   Labs:  Electrolyte and Renal Profile:  Recent Labs Lab 03/02/15 0922 03/05/15 2213  BUN 18 25*  CREATININE 4.60* 6.01*  NA 139 140  K 3.1* 3.9     Medications: renvela, carafate, aspart, reglan, creon   Physical  Findings: Nutrition-Focused physical exam completed. Findings are WDL for fat depletion, muscle depletion, and edema.    Height:  Ht Readings from Last 1 Encounters:  03/06/15 5\' 5"  (1.651 m)    Weight:  Wt Readings from Last 1 Encounters:  03/06/15 178 lb 9.2 oz (81 kg)     Pt reports dry wt of 89.5kg (196 pounds)   Wt Readings from Last 10 Encounters:  03/06/15 178 lb 9.2 oz (81 kg)  03/02/15 192 lb 14.4 oz (87.499 kg)  02/23/15 187 lb 6.3 oz (85 kg)  02/08/15 198 lb (89.812 kg)    BMI:  Body mass index is 29.72 kg/(m^2).  Estimated Nutritional Needs:  Kcal:  Using IBW of 57kg  BEE 1165 kcals (IF 1.2-1.4, AF 1.3) [id number] kcals/d  Protein:  68-86 g/d (1.2-1.5 g/kg) Using IBW of 57kg  Fluid:  1034ml + UOP   Diet Order:  Diet NPO time specified Except for: Sips with Meds  EDUCATION NEEDS:  No education needs identified at this time   Intake/Output Summary (Last 24 hours) at 03/06/15 1451 Last data filed at 03/06/15 1330  Gross per 24 hour  Intake      0 ml  Output   2000 ml  Net  -2000 ml     MODERATE Care Level Haniah Penny B. Zenia Resides, [place], [place] (pager)

## 2015-03-06 NOTE — Care Management (Signed)
[name] feels that Rx causes patient to get confused. He thinks Dilaudid causes it.  ":Zofran or Dilaudid". He states that she is normally oriented.  Lives with family. [name]. [name] [phone]). Sees Dr Quay Burow at [facility] takes her to dialysis. She still works every other weekend. She recently discharged without home health but [name] feels she will need it this time. Established dialysis patient at [facility] on [address] x 10 years M, W, F.  No skilled facility. No home oxygen. Either daughter or son will transport when discharged. [name] feels that she has declined in her health since pneumonia. RNCM will continue to follow.

## 2015-03-06 NOTE — Progress Notes (Signed)
[facility]  ROUNDING NOTE   Subjective:  Patient presented for abdominal pain. She c/o vomiting x 2 weeks Missed dialysis yesterday Patient seen during dialysis Tolerating well  Patient reports of headache that started during dialysis  Objective:  Vital signs in last 24 hours:  Temp:  [97.4 F (36.3 C)-97.5 F (36.4 C)] 97.5 F (36.4 C) (06/14 0801) Pulse Rate:  [61-141] 66 (06/14 0801) Resp:  [15-19] 18 (06/14 0801) BP: (141-196)/(69-86) 189/76 mmHg (06/14 0801) SpO2:  [47 %-99 %] 92 % (06/14 0801) Weight:  [85.049 kg (187 lb 8 oz)-87.499 kg (192 lb 14.4 oz)] 85.049 kg (187 lb 8 oz) (06/14 0758)  Weight change:  Filed Weights   03/05/15 2159 03/06/15 0758  Weight: 87.499 kg (192 lb 14.4 oz) 85.049 kg (187 lb 8 oz)    Intake/Output:     Intake/Output this shift:     Physical Exam: General: NAD  Head: Normocephalic, atraumatic. Moist oral mucosal membranes  Eyes: Anicteric  Neck: Supple, trachea midline  Lungs:  Diminished BS at bases, normal effort  Heart: Regular rate and rhythm S1S2  Abdomen:  Soft, nontender,   Extremities:  no peripheral edema.  Neurologic: Awake, alert, follows commands  Skin: No rashes  Access: LUE AV access    Basic Metabolic Panel:  Recent Labs Lab 03/02/15 0922 03/05/15 2213  NA 139 140  K 3.1* 3.9  CL 98* 97*  CO2 30 28  GLUCOSE 77 107*  BUN 18 25*  CREATININE 4.60* 6.01*  CALCIUM 8.5* 8.8*    Liver Function Tests:  Recent Labs Lab 03/02/15 0922 03/05/15 2213  AST 22 38  ALT 15 22  ALKPHOS 99 100  BILITOT 0.8 0.9  PROT 6.9 6.9  ALBUMIN 3.6 3.5    Recent Labs Lab 03/02/15 0922 03/05/15 2213  LIPASE 21* 19*   No results for input(s): AMMONIA in the last 168 hours.  CBC:  Recent Labs Lab 03/05/15 2213  WBC 5.2  NEUTROABS 2.9  HGB 10.1*  HCT 32.1*  MCV 85.2  PLT 119*    Cardiac Enzymes:  Recent Labs Lab 03/02/15 0922 03/05/15 2213 03/05/15 2217  CKTOTAL  --   --  7   TROPONINI <0.03 <0.03  --     BNP: Invalid input(s): POCBNP  CBG: No results for input(s): GLUCAP in the last 168 hours.  Microbiology: Results for orders placed or performed during the hospital encounter of 02/19/15  Blood culture (routine x 2)     Status: None   Collection Time: 02/19/15 12:20 PM  Result Value Ref Range Status   Specimen Description BLOOD  Final   Special Requests Immunocompromised  Final   Culture NO GROWTH 5 DAYS  Final   Report Status 02/24/2015 FINAL  Final  Blood culture (routine x 2)     Status: None   Collection Time: 02/19/15 12:30 PM  Result Value Ref Range Status   Specimen Description BLOOD  Final   Special Requests Immunocompromised  Final   Culture NO GROWTH 5 DAYS  Final   Report Status 02/24/2015 FINAL  Final  Body fluid culture     Status: None   Collection Time: 02/20/15  2:55 PM  Result Value Ref Range Status   Specimen Description PLEURAL  Final   Special Requests NONE  Final   Gram Stain   Final    FEW WBC SEEN RARE RED BLOOD CELLS FEW GRAM VARIABLE ROD    Culture NO GROWTH 4 DAYS  Final  Report Status 02/24/2015 FINAL  Final  Body fluid culture     Status: None   Collection Time: 02/21/15  4:30 PM  Result Value Ref Range Status   Specimen Description PLEURAL  Final   Special Requests NONE  Final   Gram Stain MANY WBC SEEN NO ORGANISMS SEEN   Final   Culture NO GROWTH 3 DAYS  Final   Report Status 02/26/2015 FINAL  Final    Coagulation Studies: No results for input(s): LABPROT, INR in the last 72 hours.  Urinalysis:  Recent Labs  03/06/15 0146  COLORURINE YELLOW*  LABSPEC 1.010  PHURINE 9.0*  GLUCOSEU 50*  HGBUR 1+*  BILIRUBINUR NEGATIVE  KETONESUR NEGATIVE  PROTEINUR 100*  NITRITE NEGATIVE  LEUKOCYTESUR NEGATIVE      Imaging: Dg Chest 2 View  03/06/2015   CLINICAL DATA:  Vomiting for 2 weeks. Seen here for same recently. Diffuse abdominal pain for 2 hours.  EXAM: CHEST  2 VIEW  COMPARISON:  03/02/2015   FINDINGS: Cardiac enlargement with mild pulmonary vascular congestion. Perihilar edema with small bilateral pleural effusions and basilar atelectasis. Similar appearance to previous study. Vascular grafts in both axillary regions.  IMPRESSION: Cardiac enlargement with pulmonary vascular congestion and mild perihilar edema. Bilateral pleural effusions and basilar atelectasis.   Electronically Signed   By: Lucienne Capers M.D.   On: 03/06/2015 04:30   Dg Abd 1 View  03/06/2015   CLINICAL DATA:  Emesis and abdominal pain.  EXAM: ABDOMEN - 1 VIEW  COMPARISON:  03/02/2015  FINDINGS: The bowel gas pattern is normal. No radio-opaque calculi or other significant radiographic abnormality are seen. Surgical clips in the right upper quadrant.  IMPRESSION: Negative.   Electronically Signed   By: Lucienne Capers M.D.   On: 03/06/2015 04:30     Medications:     . amLODipine  10 mg Oral Daily  . aspirin EC  81 mg Oral Daily  . atorvastatin  40 mg Oral Daily  . buPROPion  150 mg Oral BID  . carvedilol  25 mg Oral BID  . cholecalciferol  800 Units Oral Daily  . citalopram  20 mg Oral Daily  . clopidogrel  75 mg Oral Daily  . furosemide  20 mg Intravenous Once  . gabapentin  100 mg Oral Daily  . heparin  5,000 Units Subcutaneous 3 times per day  . insulin aspart  0-15 Units Subcutaneous TID WC  . lipase/protease/amylase  24,000 Units Oral With snacks  . lipase/protease/amylase  48,000 Units Oral TID WC  . loratadine  10 mg Oral Daily  . metoCLOPramide  5 mg Oral TID AC & HS  .  morphine injection  4 mg Intravenous Once  . pantoprazole  40 mg Oral BID AC  . promethazine  12.5 mg Intravenous Once  . ranolazine  1,000 mg Oral BID  . sevelamer carbonate  800 mg Oral TID WC  . sodium chloride  3 mL Intravenous Q12H  . sucralfate  1 g Oral TID WC & HS   sodium chloride, acetaminophen **OR** acetaminophen, albuterol, HYDROcodone-acetaminophen, labetalol, morphine injection, nitroGLYCERIN, promethazine,  sodium chloride  Assessment/ Plan:  54 yo AAF with h/o ESRD, DM (30 yrs with retinopathy, neuropathy), OA, IBS, hyperlipidemia, h/o GI bleed, SHPTH, AOCD, diastolic heart failure , RCA stent Feb 2013, C. diff colitis 7/14, clotted RUE AVF 10/14, admission for shortness of breath 02/20/15. Abdominal pain  N/V June 14  1.  ESRD on HD MWF:   Patient seen during dialysis  Tolerating well  Reports of headache Morphine iv x 1 2.  Nausea/Vomiting- patient has h/o severe Gastroparesis.   3.  Anemia of CKD: hgb 10.1. Will start EPO next treatment 4.  SHPTH of renal origin:  Monitor phos level   LOS: 0 Pilar Westergaard 6/14/201610:27 AM

## 2015-03-06 NOTE — ED Notes (Signed)
Given report to floor nurse. Pt is confused and thinks that she is in church. She is not currently trying to get out of bed. Floor nurse aware of these changes. Hospitalist aware also.

## 2015-03-06 NOTE — Progress Notes (Signed)
PRE HD   

## 2015-03-06 NOTE — H&P (Signed)
[facility] NAME: [name]    MR#:  [id number]  DATE OF BIRTH:  Feb 09, 1961  DATE OF ADMISSION:  03/05/2015  PRIMARY CARE PHYSICIAN: Ellamae Sia, MD   REQUESTING/REFERRING PHYSICIAN: Jeannie Fend  CHIEF COMPLAINT:   Chief Complaint  Patient presents with  . Emesis  . Abdominal Pain    HISTORY OF PRESENT ILLNESS:  [name]  is a 54 y.o. female with a known history of end-stage renal disease on hemodialysis Monday Wednesday Friday, diabetes mellitus type 2 with gastroparesis, hypertension, coronary artery disease, CHF, chronic pancreatitis, GERD, recent [facility] admission on 02/19/2015 with pneumonia presents to the emergency room with the complaints of worsening nausea and vomiting ongoing for the past 2 weeks intermittently. Does have some generalized abdominal pain. Denies any fever or chills. No blood in vomitus. No diarrhea. Denies any chest pain. Admits having shortness of breath on and off. She missed her dialysis on Monday and her last hemodialysis was on Friday. She mentions that her daughter who stays with her at present has started a new job and she has to live alone with 2 small children at home and she feels anxious and scared to live by herself with the children at home. In the emergency room she was evaluated by the ED physician found to be with stable vital signs and afebrile and labs showed BUN 25 and creatinine 6.01. LFTs and lipase and WBC within normal limits. H&H stable at 10.1/32.1. Urinalysis unremarkable. X-ray of the abdomen negative for any intra-abdominal pathology. Chest x-ray with pulmonary vascular congestion, bilateral pleural effusions. EKG normal sinus rhythm with ventricular rate of 66 bpm, no acute ST-T changes. Patient was given IV morphine and Phenergan in the ED had some temporary relief of GI symptoms but continued to have intermittent nausea and vomiting hence hospitalist service was consulted  for further management.  PAST MEDICAL HISTORY:   Past Medical History  Diagnosis Date  . Asthma   . Collagen vascular disease   . Diabetes mellitus without complication   . Hypertension   . Renal insufficiency dialysis     3 days weekly mon wed friday  . Coronary artery disease     stent  . Diabetic neuropathy   . ESRD (end stage renal disease) on dialysis     M-W-F  . GERD (gastroesophageal reflux disease)   . Hx of pancreatitis 2015  . Myocardial infarction   . Pneumonia   . Dialysis patient     M,W,F    PAST SURGICAL HISTORY:   Past Surgical History  Procedure Laterality Date  . Cholecystectomy    . Appendectomy    . Abdominal hysterectomy    . Dialysis fistula creation Left     upper arm    SOCIAL HISTORY:   History  Substance Use Topics  . Smoking status: Former Smoker -- 0.50 packs/day    Types: Cigarettes    Quit date: 02/13/2015  . Smokeless tobacco: Never Used  . Alcohol Use: No    FAMILY HISTORY:   Family History  Problem Relation Age of Onset  . Kidney disease Mother   . Diabetes Mother     DRUG ALLERGIES:   Allergies  Allergen Reactions  . Ace Inhibitors Swelling  . Codeine Nausea And Vomiting  . Compazine [Prochlorperazine Edisylate] Nausea And Vomiting  . Zofran [Ondansetron Hcl] Other (See Comments)    Dementia sx  . Oxycodone Anxiety  . Tape Rash  REVIEW OF SYSTEMS:   Review of Systems  Constitutional: Positive for malaise/fatigue. Negative for fever and chills.  HENT: Negative for ear pain, hearing loss, nosebleeds, sore throat and tinnitus.   Eyes: Negative for blurred vision, double vision, pain, discharge and redness.  Respiratory: Positive for shortness of breath. Negative for cough, hemoptysis, sputum production and wheezing.   Cardiovascular: Negative for chest pain, palpitations, orthopnea and leg swelling.  Gastrointestinal: Positive for nausea, vomiting and abdominal pain. Negative for diarrhea, constipation,  blood in stool and melena.  Genitourinary: Negative for dysuria, urgency, frequency and hematuria.  Musculoskeletal: Positive for myalgias. Negative for back pain, joint pain and neck pain.  Skin: Negative for itching and rash.  Neurological: Negative for dizziness, tingling, sensory change, focal weakness and seizures.  Endo/Heme/Allergies: Does not bruise/bleed easily.  Psychiatric/Behavioral: Negative for depression. The patient is not nervous/anxious.     MEDICATIONS AT HOME:   Prior to Admission medications   Medication Sig Start Date End Date Taking? Authorizing Provider  albuterol (PROVENTIL HFA;VENTOLIN HFA) 108 (90 BASE) MCG/ACT inhaler Inhale 4-6 puffs by mouth every 4 hours as needed for wheezing, cough, and/or shortness of breath 02/17/15   Hinda Kehr, MD  amLODipine (NORVASC) 10 MG tablet Take 10 mg by mouth daily.    Historical Provider, MD  aspirin EC 81 MG tablet Take 81 mg by mouth daily.    Historical Provider, MD  atorvastatin (LIPITOR) 40 MG tablet Take 40 mg by mouth daily.    Historical Provider, MD  buPROPion (WELLBUTRIN SR) 150 MG 12 hr tablet Take 150 mg by mouth 2 (two) times daily.    Historical Provider, MD  carvedilol (COREG) 25 MG tablet Take 25 mg by mouth 2 (two) times daily.    Historical Provider, MD  cetirizine (ZYRTEC) 10 MG tablet Take 10 mg by mouth daily.    Historical Provider, MD  citalopram (CELEXA) 20 MG tablet Take 20 mg by mouth daily.    Historical Provider, MD  clopidogrel (PLAVIX) 75 MG tablet Take 75 mg by mouth daily.    Historical Provider, MD  Ergocalciferol (VITAMIN D2) 400 UNITS TABS Take 2 tablets by mouth daily.     Historical Provider, MD  gabapentin (NEURONTIN) 100 MG capsule Take 100 mg by mouth daily.    Historical Provider, MD  HYDROcodone-acetaminophen (NORCO/VICODIN) 5-325 MG per tablet Take 1 tablet by mouth every 6 (six) hours as needed for moderate pain. 02/08/15   Earleen Newport, MD  hydrocortisone 1 % ointment Apply 1  application topically 2 (two) times daily.    Historical Provider, MD  insulin aspart (NOVOLOG) 100 UNIT/ML injection Inject 2-6 Units into the skin 3 (three) times daily with meals. Pt uses as needed per sliding scale.    Historical Provider, MD  insulin detemir (LEVEMIR) 100 UNIT/ML injection Inject 8 Units into the skin at bedtime.    Historical Provider, MD  levofloxacin (LEVAQUIN) 500 MG tablet Take 1 tablet (500 mg total) by mouth every other day. 02/24/15   Srikar Sudini, MD  lipase/protease/amylase (CREON) 12000 UNITS CPEP capsule Take 24,000-48,000 Units by mouth 5 (five) times daily. Pt takes four capsules with meals and two capsules with snacks.    Historical Provider, MD  metoCLOPramide (REGLAN) 5 MG tablet Take 1 tablet (5 mg total) by mouth 4 (four) times daily -  before meals and at bedtime. 02/24/15   Srikar Sudini, MD  nitroGLYCERIN (NITROSTAT) 0.4 MG SL tablet Place 0.4 mg under the tongue every 5 (five)  minutes as needed for chest pain.    Historical Provider, MD  Omega-3 Fatty Acids (FISH OIL) 1000 MG CAPS Take 1 capsule by mouth 2 (two) times daily.    Historical Provider, MD  ondansetron (ZOFRAN) 4 MG tablet Take 1 tablet (4 mg total) by mouth daily as needed for nausea or vomiting. 02/08/15   Earleen Newport, MD  pantoprazole (PROTONIX) 40 MG tablet Take 1 tablet (40 mg total) by mouth 2 (two) times daily before a meal. 02/24/15   [name], MD  ranitidine (ZANTAC) 150 MG tablet Take 150 mg by mouth 2 (two) times daily.    Historical Provider, MD  ranolazine (RANEXA) 1000 MG SR tablet Take 1,000 mg by mouth 2 (two) times daily.     Historical Provider, MD  sevelamer carbonate (RENVELA) 800 MG tablet Take 800 mg by mouth 3 (three) times daily with meals.    Historical Provider, MD  sucralfate (CARAFATE) 1 G tablet Take 1 tablet (1 g total) by mouth 4 (four) times daily -  with meals and at bedtime. 02/24/15   Srikar Sudini, MD      VITAL SIGNS:  Blood pressure 186/80, pulse 65,  temperature 97.4 F (36.3 C), temperature source Oral, resp. rate 17, height 5\' 5"  (1.651 m), weight 87.499 kg (192 lb 14.4 oz), SpO2 93 %.  PHYSICAL EXAMINATION:  Physical Exam  Constitutional: She is oriented to person, place, and time. She appears well-developed and well-nourished. No distress.  HENT:  Head: Normocephalic and atraumatic.  Right Ear: External ear normal.  Left Ear: External ear normal.  Nose: Nose normal.  Mouth/Throat: Oropharynx is clear and moist. No oropharyngeal exudate.  Eyes: EOM are normal. Pupils are equal, round, and reactive to light. No scleral icterus.  Neck: Normal range of motion. Neck supple. No JVD present. No thyromegaly present.  Cardiovascular: Normal rate, regular rhythm, normal heart sounds and intact distal pulses.  Exam reveals no friction rub.   No murmur heard. Respiratory: Effort normal. No respiratory distress. She has no wheezes. She has rales. She exhibits no tenderness.  GI: Soft. Bowel sounds are normal. She exhibits no distension and no mass. There is tenderness. There is no rebound and no guarding.  Musculoskeletal: Normal range of motion. She exhibits no edema.  Lymphadenopathy:    She has no cervical adenopathy.  Neurological: She is alert and oriented to person, place, and time. She has normal reflexes. She displays normal reflexes. No cranial nerve deficit. She exhibits normal muscle tone.  Skin: Skin is warm. No rash noted. No erythema.  Psychiatric: She has a normal mood and affect. Her behavior is normal. Thought content normal.   LABORATORY PANEL:   CBC  Recent Labs Lab 03/05/15 2213  WBC 5.2  HGB 10.1*  HCT 32.1*  PLT 119*   ------------------------------------------------------------------------------------------------------------------  Chemistries   Recent Labs Lab 03/05/15 2213  NA 140  K 3.9  CL 97*  CO2 28  GLUCOSE 107*  BUN 25*  CREATININE 6.01*  CALCIUM 8.8*  AST 38  ALT 22  ALKPHOS 100   BILITOT 0.9   ------------------------------------------------------------------------------------------------------------------  Cardiac Enzymes  Recent Labs Lab 03/05/15 2213  TROPONINI <0.03   ------------------------------------------------------------------------------------------------------------------  RADIOLOGY:  Dg Chest 2 View  03/06/2015   CLINICAL DATA:  Vomiting for 2 weeks. Seen here for same recently. Diffuse abdominal pain for 2 hours.  EXAM: CHEST  2 VIEW  COMPARISON:  03/02/2015  FINDINGS: Cardiac enlargement with mild pulmonary vascular congestion. Perihilar edema with small  bilateral pleural effusions and basilar atelectasis. Similar appearance to previous study. Vascular grafts in both axillary regions.  IMPRESSION: Cardiac enlargement with pulmonary vascular congestion and mild perihilar edema. Bilateral pleural effusions and basilar atelectasis.   Electronically Signed   By: Lucienne Capers M.D.   On: 03/06/2015 04:30   Dg Abd 1 View  03/06/2015   CLINICAL DATA:  Emesis and abdominal pain.  EXAM: ABDOMEN - 1 VIEW  COMPARISON:  03/02/2015  FINDINGS: The bowel gas pattern is normal. No radio-opaque calculi or other significant radiographic abnormality are seen. Surgical clips in the right upper quadrant.  IMPRESSION: Negative.   Electronically Signed   By: Lucienne Capers M.D.   On: 03/06/2015 04:30    EKG:   Orders placed or performed during the hospital encounter of 03/05/15  . ED EKG  . ED EKG  . EKG 12-Lead normal sinus rhythm with ventricular rate of 66 bpm, no acute ST-T changes.   . EKG 12-Lead    IMPRESSION AND PLAN:   1. Ongoing nausea and vomiting with abdominal discomfort for the past 2 weeks. History of gastroparesis +. X-ray of the abdomen, LFTs, lipase and a WBC within normal limits. Corene Cornea does likely secondary to gastroparesis. Plan: Admit to [facility], keep her nothing by mouth for now, IV Phenergan when necessary, continue Reglan. Consider  further workup including GI consultation if symptoms are not controlled. 2. End-stage renal disease on HD/Monday, Wednesday, Friday. Missed hemodialysis on Monday, last HD on Friday. BUN/creatinine-25/6.0, potassium WNL. Chest x-ray mild pulmonary vessel congestion. Plan: Furosemide 20 mg IV now, nephrology consultation requested for initiation of HD. Follow-up BMP. 3. Diabetes mellitus on insulin, stable. Sliding scale insulin for now since patient nothing by mouth, monitor blood sugars. 4. Hypertension, stable. Continue home medications. 5. History of coronary artery disease, stable on medical management. Continue home medications. 6. History of bronchial asthma, stable on when necessary albuterol. Continue same. 7. History of pancreatitis. Lipase WNL. Monitor, follow-up lipase.    All the records are reviewed and case discussed with ED provider. Management plans discussed with the patient, family and they are in agreement.  CODE STATUS: Full code  TOTAL TIME TAKING CARE OF THIS PATIENT: 50 minutes.    Juluis Mire M.D on 03/06/2015 at 6:45 AM  Between 7am to 6pm - Pager - [phone]  After 6pm go to [email or web address] - password [email or web address] [facility]  Office  [phone]  CC: Primary care physician; Ellamae Sia, MD

## 2015-03-06 NOTE — Care Management Note (Signed)
Patient is active at out patient dialysis [facility] MWF.  I will update clinic with hospital records at discharge.  Clinic has been notified [name]   [phone]

## 2015-03-06 NOTE — Consult Note (Signed)
See note from [place].  Pt with hx of pneumonia, cardiac disease, diabetes for 20 years. Chronic renal failure on dialysis.  Chest with insp crackles lower lung fields with decreased breath sounds.  She can't walk to the mail box without chest pain and SOB.  REcommend cardiology consult, possible pulmonary consult.  Hold on EGD for now but will follow with you.

## 2015-03-06 NOTE — ED Notes (Signed)
Spoke with Dr. Fay Records and Dr. Posey Pronto about pt being confused on Phenergan.

## 2015-03-06 NOTE — ED Notes (Signed)
Called lab for CK add-on

## 2015-03-06 NOTE — Consult Note (Signed)
GI Inpatient Consult Note  Reason for Consult: Intractable Nausea and Vomiting   Attending Requesting Consult: Dr. Benjie Karvonen History of Present Illness: [name] is a 54 y.o. female who complains of nausea and vomiting since early yesterday morning.  She was recently hospitalized for the same symptoms and was discharged home Friday 6/10/  She reports she was fine until early Monday morning but then had to miss her dialysis appt Monday due to vomiting.  She reports vomiting undigested food, mainly a cheeseburger she had eaten after her discharge from [facility].  She reports she is losing her voice from all the vomiting.  She states the vomiting has subsided, but she is still nauseous and experiences nausea when she moves.  She reports RUQ abdominal pain which is constant. She is also experiencing some shortness of breath.  She denies hematemesis.   [name] is followed by [facility] and has a history of ESRD on dialysis 3 times weekly; DM, MI/CAD with stent placement some years ago, pancreatitis, EPI, GERD, gastroparesis. On her last hospital admission she did admit to using some ibuprofen for headaches. She does have Creon to use for EPI, however has been doing this irregularly. She has been on Nexium daily and carafate bid for her GERD.   Her last colonoscopy was 8/14 by Dr. Gustavo Lah with an adenomatous polyp and negative random colon biopsies.  Her last EGD was 2010 with gastritis.  There was no H. Pylori, dysplasia, malignancy, or Barretts.  She had a gastric emptying test in 2011 with delayed gastric emptying.  Past Medical History:  Past Medical History  Diagnosis Date  . Asthma   . Collagen vascular disease   . Diabetes mellitus without complication   . Hypertension   . Renal insufficiency dialysis     3 days weekly mon wed friday  . Coronary artery disease     stent  . Diabetic neuropathy   . ESRD (end stage renal disease) on dialysis     M-W-F  . GERD (gastroesophageal reflux disease)    . Hx of pancreatitis 2015  . Myocardial infarction   . Pneumonia   . Dialysis patient     M,W,F    Problem List: Patient Active Problem List   Diagnosis Date Noted  . Nausea & vomiting 03/06/2015  . Abdominal pain 03/06/2015  . DM (diabetes mellitus) 03/06/2015  . HTN (hypertension) 03/06/2015  . Gastroparesis 02/24/2015  . Pleural effusion 02/19/2015  . HCAP (healthcare-associated pneumonia) 02/19/2015  . End-stage renal disease on hemodialysis 02/19/2015    Past Surgical History: Past Surgical History  Procedure Laterality Date  . Cholecystectomy    . Appendectomy    . Abdominal hysterectomy    . Dialysis fistula creation Left     upper arm    Allergies: Allergies  Allergen Reactions  . Ace Inhibitors Swelling  . Codeine Nausea And Vomiting  . Compazine [Prochlorperazine Edisylate] Nausea And Vomiting  . Losartan Other (See Comments)  . Phenergan [Promethazine Hcl] Other (See Comments)    Hallucinations  . Zofran [Ondansetron Hcl] Other (See Comments)    Dementia sx  . Oxycodone Anxiety  . Tape Rash    Home Medications: Prescriptions prior to admission  Medication Sig Dispense Refill Last Dose  . amLODipine (NORVASC) 10 MG tablet Take 10 mg by mouth daily.   03/05/2015 at Unknown time  . aspirin EC 81 MG tablet Take 81 mg by mouth daily.   03/05/2015 at Unknown time  . atorvastatin (LIPITOR)  40 MG tablet Take 40 mg by mouth daily.   03/05/2015 at Unknown time  . buPROPion (WELLBUTRIN SR) 150 MG 12 hr tablet Take 150 mg by mouth 2 (two) times daily.   03/05/2015 at Unknown time  . carvedilol (COREG) 25 MG tablet Take 25 mg by mouth 2 (two) times daily.   03/05/2015 at Unknown time  . cetirizine (ZYRTEC) 10 MG tablet Take 10 mg by mouth daily.   03/05/2015 at Unknown time  . citalopram (CELEXA) 20 MG tablet Take 20 mg by mouth daily.   03/05/2015 at Unknown time  . clopidogrel (PLAVIX) 75 MG tablet Take 75 mg by mouth daily.   03/05/2015 at Unknown time  . Ergocalciferol  (VITAMIN D2) 400 UNITS TABS Take 2 tablets by mouth daily.    03/05/2015 at Unknown time  . gabapentin (NEURONTIN) 100 MG capsule Take 100 mg by mouth daily.   03/05/2015 at Unknown time  . hydrocortisone 1 % ointment Apply 1 application topically 2 (two) times daily.   03/05/2015 at Unknown time  . insulin aspart (NOVOLOG) 100 UNIT/ML injection Inject 2-6 Units into the skin 3 (three) times daily with meals. Pt uses as needed per sliding scale.   03/05/2015 at Unknown time  . insulin detemir (LEVEMIR) 100 UNIT/ML injection Inject 8 Units into the skin at bedtime.   03/05/2015 at Unknown time  . lipase/protease/amylase (CREON) 12000 UNITS CPEP capsule Take 24,000-48,000 Units by mouth 5 (five) times daily. Pt takes four capsules with meals and two capsules with snacks.   03/05/2015 at Unknown time  . nitroGLYCERIN (NITROSTAT) 0.4 MG SL tablet Place 0.4 mg under the tongue every 5 (five) minutes as needed for chest pain.   03/05/2015 at Unknown time  . Omega-3 Fatty Acids (FISH OIL) 1000 MG CAPS Take 1 capsule by mouth 2 (two) times daily.   03/05/2015 at Unknown time  . ranitidine (ZANTAC) 150 MG tablet Take 150 mg by mouth 2 (two) times daily.   03/05/2015 at Unknown time  . ranolazine (RANEXA) 1000 MG SR tablet Take 1,000 mg by mouth 2 (two) times daily.    03/05/2015 at Unknown time  . sevelamer carbonate (RENVELA) 800 MG tablet Take 800 mg by mouth 3 (three) times daily with meals.   03/05/2015 at Unknown time  . albuterol (PROVENTIL HFA;VENTOLIN HFA) 108 (90 BASE) MCG/ACT inhaler Inhale 4-6 puffs by mouth every 4 hours as needed for wheezing, cough, and/or shortness of breath 1 Inhaler 1 02/19/2015 at Unknown time  . HYDROcodone-acetaminophen (NORCO/VICODIN) 5-325 MG per tablet Take 1 tablet by mouth every 6 (six) hours as needed for moderate pain. 20 tablet 0 Past Week at Unknown time  . levofloxacin (LEVAQUIN) 500 MG tablet Take 1 tablet (500 mg total) by mouth every other day. 5 tablet 0   . metoCLOPramide  (REGLAN) 5 MG tablet Take 1 tablet (5 mg total) by mouth 4 (four) times daily -  before meals and at bedtime. 40 tablet 0   . ondansetron (ZOFRAN) 4 MG tablet Take 1 tablet (4 mg total) by mouth daily as needed for nausea or vomiting. 20 tablet 1 02/19/2015 at Unknown time  . pantoprazole (PROTONIX) 40 MG tablet Take 1 tablet (40 mg total) by mouth 2 (two) times daily before a meal. 60 tablet 0   . sucralfate (CARAFATE) 1 G tablet Take 1 tablet (1 g total) by mouth 4 (four) times daily -  with meals and at bedtime. 120 tablet 0    Home  medication reconciliation was completed with the patient.   Scheduled Inpatient Medications:   . amLODipine  10 mg Oral Daily  . atorvastatin  40 mg Oral Daily  . buPROPion  150 mg Oral BID  . carvedilol  25 mg Oral BID  . cholecalciferol  800 Units Oral Daily  . citalopram  20 mg Oral Daily  . furosemide  20 mg Intravenous Once  . gabapentin  100 mg Oral Daily  . heparin  3,000 Units Dialysis Once in dialysis  . heparin  5,000 Units Subcutaneous 3 times per day  . insulin aspart  0-15 Units Subcutaneous TID WC  . labetalol  100 mg Oral BID  . lipase/protease/amylase  24,000 Units Oral With snacks  . lipase/protease/amylase  48,000 Units Oral TID WC  . [START ON 03/08/2015] loratadine  10 mg Oral QODAY  . metoCLOPramide  5 mg Oral TID AC & HS  . pantoprazole  40 mg Oral BID AC  . ranolazine  1,000 mg Oral BID  . sevelamer carbonate  800 mg Oral TID WC  . sodium chloride  3 mL Intravenous Q12H  . sucralfate  1 g Oral TID WC & HS    Continuous Inpatient Infusions:     PRN Inpatient Medications:  sodium chloride, acetaminophen **OR** acetaminophen, albuterol, HYDROcodone-acetaminophen, labetalol, morphine injection, nitroGLYCERIN, promethazine, sodium chloride  Family History: family history includes Diabetes in her mother; Kidney disease in her mother.    Social History:   reports that she quit smoking about 3 weeks ago. Her smoking use included  Cigarettes. She smoked 0.50 packs per day. She has never used smokeless tobacco. She reports that she does not drink alcohol or use illicit drugs.   Review of Systems: Constitutional: Weight is stable.  Eyes: No changes in vision. ENT: No oral lesions, sore throat.  GI: see HPI.  Heme/Lymph: No easy bruising.  CV: No chest pain.  GU: No hematuria.  Integumentary: No rashes.  Neuro: No headaches.  Psych: No depression/anxiety.  Endocrine: No heat/cold intolerance.  Allergic/Immunologic: No urticaria.  Resp: No cough. Musculoskeletal: No joint swelling.    Physical Examination: BP 163/90 mmHg  Pulse 65  Temp(Src) 98.6 F (37 C) (Oral)  Resp 18  Ht 5\' 5"  (1.651 m)  Wt 81 kg (178 lb 9.2 oz)  BMI 29.72 kg/m2  SpO2 98% Gen: NAD, alert and oriented x 4.  Patient was having difficulty catching her breath while talking.  Pulse ox was WNL HEENT: PEERLA, EOMI, Neck: supple, no JVD or thyromegaly Chest: CTA bilaterally, no wheezes, crackles, or other adventitious sounds CV: RRR, no m/g/c/r Abd: soft,  generalized tenderness +BS in all four quadrants; no HSM, guarding, ridigity, or rebound tenderness Ext: no edema, well perfused with 2+ pulses, Skin: no rash or lesions noted Lymph: no LAD  Data: Lab Results  Component Value Date   WBC 5.2 03/05/2015   HGB 10.1* 03/05/2015   HCT 32.1* 03/05/2015   MCV 85.2 03/05/2015   PLT 119* 03/05/2015    Recent Labs Lab 03/05/15 2213  HGB 10.1*   Lab Results  Component Value Date   NA 140 03/05/2015   K 3.9 03/05/2015   CL 97* 03/05/2015   CO2 28 03/05/2015   BUN 25* 03/05/2015   CREATININE 6.01* 03/05/2015   Lab Results  Component Value Date   ALT 22 03/05/2015   AST 38 03/05/2015   ALKPHOS 100 03/05/2015   BILITOT 0.9 03/05/2015   No results for input(s): APTT, INR, PTT  in the last 168 hours.   Imaging:  CLINICAL DATA: Emesis and abdominal pain.  EXAM: ABDOMEN - 1 VIEW  COMPARISON: 03/02/2015  FINDINGS: The  bowel gas pattern is normal. No radio-opaque calculi or other significant radiographic abnormality are seen. Surgical clips in the right upper quadrant.  IMPRESSION: Negative.   Electronically Signed  By: Lucienne Capers M.D.  On: 03/06/2015 04:30 Assessment/Plan:  Assessment:  1.Nausea vomiting and dyspepsia in the setting of known history of diabetic gastroparesis, gaseous esophageal reflux, CKD. She has shown gastritis on previous endoscopy.   Plan:  1. Agree with scheduled antiemetics for 24-36 hours and NPO until symptoms improve. 2. Recommend EGD when clinically feasible. We have initiated a cardiology consult for cardiology clearance in regards to holding the Plavix and procedure. Thank you for the consult. Please call with questions or concerns.  Salvadore Farber, PA-C  This case was discussed with Dr. Manya Silvas in collaboration of care.  These services were provided by Sophea Rackham S. Celesta Aver, PA-C under collaborative practice agreement with Manya Silvas, MD.

## 2015-03-06 NOTE — Progress Notes (Signed)
[place] [facility] [place] NAME: [name]    MR#:  [id number]  DATE OF BIRTH:  Sep 02, 1961  SUBJECTIVE:  Patient seen and evaluated in dialysis . Patient here and nausea and vomiting.  REVIEW OF SYSTEMS:    Review of Systems  Constitutional: Negative for fever, chills and malaise/fatigue.  HENT: Negative for sore throat.   Eyes: Negative for blurred vision.  Respiratory: Negative for cough, hemoptysis, shortness of breath and wheezing.   Cardiovascular: Negative for chest pain, palpitations and leg swelling.  Gastrointestinal: Positive for nausea, vomiting and abdominal pain. Negative for diarrhea and blood in stool.  Genitourinary: Negative for dysuria.  Musculoskeletal: Negative for back pain.  Neurological: Negative for dizziness, tremors and headaches.  Endo/Heme/Allergies: Does not bruise/bleed easily.    Tolerating Diet: npo      DRUG ALLERGIES:   Allergies  Allergen Reactions  . Ace Inhibitors Swelling  . Codeine Nausea And Vomiting  . Compazine [Prochlorperazine Edisylate] Nausea And Vomiting  . Losartan Other (See Comments)  . Phenergan [Promethazine Hcl] Other (See Comments)    Hallucinations  . Zofran [Ondansetron Hcl] Other (See Comments)    Dementia sx  . Oxycodone Anxiety  . Tape Rash    VITALS:  Blood pressure 192/79, pulse 73, temperature 97.5 F (36.4 C), temperature source Oral, resp. rate 17, height 5\' 5"  (1.651 m), weight 85.049 kg (187 lb 8 oz), SpO2 90 %.  PHYSICAL EXAMINATION:   Physical Exam  Constitutional: She is oriented to person, place, and time and well-developed, well-nourished, and in no distress. No distress.  HENT:  Head: Normocephalic.  Eyes: No scleral icterus.  Neck: Normal range of motion. Neck supple. No JVD present. No tracheal deviation present.  Cardiovascular: Normal rate, regular rhythm and normal heart sounds.  Exam reveals no gallop and no friction rub.   No murmur  heard. Pulmonary/Chest: Effort normal and breath sounds normal. No respiratory distress. She has no wheezes. She has no rales. She exhibits no tenderness.  Abdominal: Soft. Bowel sounds are normal. She exhibits no distension and no mass. There is no tenderness. There is no rebound and no guarding.  Musculoskeletal: Normal range of motion. She exhibits no edema.  Neurological: She is alert and oriented to person, place, and time.  Skin: Skin is warm. No rash noted. No erythema.  Psychiatric: Affect and judgment normal.      LABORATORY PANEL:   CBC  Recent Labs Lab 03/05/15 2213  WBC 5.2  HGB 10.1*  HCT 32.1*  PLT 119*   ------------------------------------------------------------------------------------------------------------------  Chemistries   Recent Labs Lab 03/05/15 2213  NA 140  K 3.9  CL 97*  CO2 28  GLUCOSE 107*  BUN 25*  CREATININE 6.01*  CALCIUM 8.8*  AST 38  ALT 22  ALKPHOS 100  BILITOT 0.9   ------------------------------------------------------------------------------------------------------------------  Cardiac Enzymes  Recent Labs Lab 03/02/15 0922 03/05/15 2213  TROPONINI <0.03 <0.03   ------------------------------------------------------------------------------------------------------------------  RADIOLOGY:  Dg Chest 2 View  03/06/2015   CLINICAL DATA:  Vomiting for 2 weeks. Seen here for same recently. Diffuse abdominal pain for 2 hours.  EXAM: CHEST  2 VIEW  COMPARISON:  03/02/2015  FINDINGS: Cardiac enlargement with mild pulmonary vascular congestion. Perihilar edema with small bilateral pleural effusions and basilar atelectasis. Similar appearance to previous study. Vascular grafts in both axillary regions.  IMPRESSION: Cardiac enlargement with pulmonary vascular congestion and mild perihilar edema. Bilateral pleural effusions and basilar atelectasis.   Electronically Signed  By: Lucienne Capers M.D.   On: 03/06/2015 04:30   Dg Abd 1  View  03/06/2015   CLINICAL DATA:  Emesis and abdominal pain.  EXAM: ABDOMEN - 1 VIEW  COMPARISON:  03/02/2015  FINDINGS: The bowel gas pattern is normal. No radio-opaque calculi or other significant radiographic abnormality are seen. Surgical clips in the right upper quadrant.  IMPRESSION: Negative.   Electronically Signed   By: Lucienne Capers M.D.   On: 03/06/2015 04:30     ASSESSMENT AND PLAN:   54 year old female with end-stage renal disease on hemodialysis and diabetes who presents with nausea and vomiting.  1. Nausea and vomiting: Patient continues to have nausea and vomiting. She was seen and evaluated by GI during the last hospitalization. The plan was for her to have an outpatient GI follow-up with possible EGD. Patient is currently on Plavix. I will stop Plavix and aspirin for possible EGD within the next few days. I have placed a GI consultation. Continue supportive care. Continue Reglan, Protonix and Carafate.  2. End-stage renal disease and hematemesis: Patient's currently receiving dialysis on Monday, Wednesday and Friday. Appreciate for consultation. Continue Renvela. 3. Diabetes: Patient will require some insulin. I will continue insulin as well.  4. Coronary artery disease status post MI: Patient will continue aspirin, Coreg, Plavix and atorvastatin.  5. GERD: Patient will continue PPI.  6. Accelerated hypertension: Patient is known dialysis. Her blood pressure may improve after this. He'll continue outpatient medications. I'll also start labetalol by mouth.        Management plans discussed with the patient and she is in agreement.  CODE STATUS: FULL  TOTAL TIME TAKING CARE OF THIS PATIENT: 33 minutes.   Greater than 50% counseling and coordination of care  POSSIBLE D/C , DEPENDING ON CLINICAL CONDITION.   Drey Shaff M.D on 03/06/2015 at 12:57 PM  Between 7am to 6pm - Pager - [phone] After 6pm go to [email or web address] - password [email or web address] [facility]  Office  [phone]  CC: Primary care physician; Ellamae Sia, MD

## 2015-03-06 NOTE — Progress Notes (Signed)
HD START 

## 2015-03-07 ENCOUNTER — Inpatient Hospital Stay
Admit: 2015-03-07 | Discharge: 2015-03-07 | Disposition: A | Discharge status: Home / Self Care | Payer: Medicare Other | Attending: Cardiovascular Disease | Admitting: Cardiovascular Disease

## 2015-03-07 ENCOUNTER — Other Ambulatory Visit: Payer: Self-pay | Admitting: Cardiovascular Disease

## 2015-03-07 ENCOUNTER — Encounter: Payer: Self-pay | Admitting: Physician Assistant

## 2015-03-07 DIAGNOSIS — Z992 Dependence on renal dialysis: Secondary | ICD-10-CM

## 2015-03-07 DIAGNOSIS — K3184 Gastroparesis: Secondary | ICD-10-CM

## 2015-03-07 DIAGNOSIS — E1159 Type 2 diabetes mellitus with other circulatory complications: Secondary | ICD-10-CM

## 2015-03-07 DIAGNOSIS — R112 Nausea with vomiting, unspecified: Secondary | ICD-10-CM

## 2015-03-07 DIAGNOSIS — I2510 Atherosclerotic heart disease of native coronary artery without angina pectoris: Secondary | ICD-10-CM

## 2015-03-07 DIAGNOSIS — N186 End stage renal disease: Secondary | ICD-10-CM

## 2015-03-07 LAB — BASIC METABOLIC PANEL
Anion gap: 7 (ref 5–15)
BUN: 15 mg/dL (ref 6–20)
CO2: 30 mmol/L (ref 22–32)
Calcium: 8.3 mg/dL — ABNORMAL LOW (ref 8.9–10.3)
Chloride: 100 mmol/L — ABNORMAL LOW (ref 101–111)
Creatinine, Ser: 4.3 mg/dL — ABNORMAL HIGH (ref 0.44–1.00)
GFR calc Af Amer: 12 mL/min — ABNORMAL LOW (ref >60)
GFR calc non Af Amer: 11 mL/min — ABNORMAL LOW (ref >60)
Glucose, Bld: 87 mg/dL (ref 65–99)
Potassium: 3.5 mmol/L (ref 3.5–5.1)
Sodium: 137 mmol/L (ref 135–145)

## 2015-03-07 LAB — GLUCOSE, CAPILLARY
Glucose-Capillary: 84 mg/dL (ref 65–99)
Glucose-Capillary: 75 mg/dL (ref 65–99)
Glucose-Capillary: 56 mg/dL — ABNORMAL LOW (ref 65–99)
Glucose-Capillary: 83 mg/dL (ref 65–99)
Glucose-Capillary: 149 mg/dL — ABNORMAL HIGH (ref 65–99)

## 2015-03-07 LAB — CBC
HCT: 29.7 % — ABNORMAL LOW (ref 35.0–47.0)
Hemoglobin: 9.5 g/dL — ABNORMAL LOW (ref 12.0–16.0)
MCH: 26.9 pg (ref 26.0–34.0)
MCHC: 31.9 g/dL — ABNORMAL LOW (ref 32.0–36.0)
MCV: 84.3 fL (ref 80.0–100.0)
Platelets: 114 10*3/uL — ABNORMAL LOW (ref 150–440)
RBC: 3.53 MIL/uL — ABNORMAL LOW (ref 3.80–5.20)
RDW: 21.3 % — ABNORMAL HIGH (ref 11.5–14.5)
WBC: 5.1 10*3/uL (ref 3.6–11.0)

## 2015-03-07 MED ORDER — LABETALOL HCL 200 MG PO TABS
200.0000 mg | ORAL_TABLET | Freq: Two times a day (BID) | ORAL | Status: DC
  Administered 2015-03-07 – 2015-03-08 (×2): 200 mg via ORAL
  Filled 2015-03-07 (×4): qty 1

## 2015-03-07 MED ORDER — HYDRALAZINE HCL 50 MG PO TABS
50.0000 mg | ORAL_TABLET | Freq: Four times a day (QID) | ORAL | Status: DC
  Administered 2015-03-07 – 2015-03-08 (×4): 50 mg via ORAL
  Filled 2015-03-07 (×5): qty 1

## 2015-03-07 MED ORDER — INSULIN DETEMIR 100 UNIT/ML ~~LOC~~ SOLN: 8.0000 [IU] | Freq: Every day | SUBCUTANEOUS | Status: DC

## 2015-03-07 NOTE — Progress Notes (Signed)
Subjective:  2500 cc removed by dialysis yesterday In better spirits today No nausea or headache at present  Objective:  Vital signs in last 24 hours:  Temp:  [96.8 F (36 C)-98.6 F (37 C)] 98.5 F (36.9 C) (06/15 0800) Pulse Rate:  [63-104] 66 (06/15 0800) Resp:  [15-20] 18 (06/15 0800) BP: (140-192)/(49-90) 153/49 mmHg (06/15 0800) SpO2:  [93 %-98 %] 93 % (06/15 0800) Weight:  [79.878 kg (176 lb 1.6 oz)-81 kg (178 lb 9.2 oz)] 79.878 kg (176 lb 1.6 oz) (06/15 0100)  Weight change: -2.449 kg (-5 lb 6.4 oz) Filed Weights   03/06/15 0758 03/06/15 1330 03/07/15 0100  Weight: 85.049 kg (187 lb 8 oz) 81 kg (178 lb 9.2 oz) 79.878 kg (176 lb 1.6 oz)    Intake/Output: I/O last 3 completed shifts: In: 360 [P.O.:360] Out: 2000 [Other:2000]   Intake/Output this shift:  Total I/O In: 240 [P.O.:240] Out: -   Physical Exam: General: NAD  Head: Normocephalic, atraumatic. Moist oral mucosal membranes  Eyes: Anicteric  Neck: Supple, trachea midline  Lungs:  Diminished BS at bases, normal effort  Heart: Regular rate and rhythm S1S2  Abdomen:  Soft, nontender,   Extremities:  no peripheral edema.  Neurologic: Awake, alert, follows commands  Skin: No rashes  Access: LUE AV access    Basic Metabolic Panel:  Recent Labs Lab 03/02/15 0922 03/05/15 2213  NA 139 140  K 3.1* 3.9  CL 98* 97*  CO2 30 28  GLUCOSE 77 107*  BUN 18 25*  CREATININE 4.60* 6.01*  CALCIUM 8.5* 8.8*    Liver Function Tests:  Recent Labs Lab 03/02/15 0922 03/05/15 2213  AST 22 38  ALT 15 22  ALKPHOS 99 100  BILITOT 0.8 0.9  PROT 6.9 6.9  ALBUMIN 3.6 3.5    Recent Labs Lab 03/02/15 0922 03/05/15 2213  LIPASE 21* 19*   No results for input(s): AMMONIA in the last 168 hours.  CBC:  Recent Labs Lab 03/05/15 2213  WBC 5.2  NEUTROABS 2.9  HGB 10.1*  HCT 32.1*  MCV 85.2  PLT 119*    Cardiac Enzymes:  Recent Labs Lab 03/02/15 0922 03/05/15 2213 03/05/15 2217  CKTOTAL  --    --  81  TROPONINI <0.03 <0.03  --     BNP: Invalid input(s): POCBNP  CBG:  Recent Labs Lab 03/06/15 1715 03/06/15 2123 03/07/15 0111 03/07/15 0241  GLUCAP 62* 64* 84 75    Microbiology: Results for orders placed or performed during the hospital encounter of 02/19/15  Blood culture (routine x 2)     Status: None   Collection Time: 02/19/15 12:20 PM  Result Value Ref Range Status   Specimen Description BLOOD  Final   Special Requests Immunocompromised  Final   Culture NO GROWTH 5 DAYS  Final   Report Status 02/24/2015 FINAL  Final  Blood culture (routine x 2)     Status: None   Collection Time: 02/19/15 12:30 PM  Result Value Ref Range Status   Specimen Description BLOOD  Final   Special Requests Immunocompromised  Final   Culture NO GROWTH 5 DAYS  Final   Report Status 02/24/2015 FINAL  Final  Body fluid culture     Status: None   Collection Time: 02/20/15  2:55 PM  Result Value Ref Range Status   Specimen Description PLEURAL  Final   Special Requests NONE  Final   Gram Stain   Final    FEW WBC SEEN RARE RED  BLOOD CELLS FEW GRAM VARIABLE ROD    Culture NO GROWTH 4 DAYS  Final   Report Status 02/24/2015 FINAL  Final  Body fluid culture     Status: None   Collection Time: 02/21/15  4:30 PM  Result Value Ref Range Status   Specimen Description PLEURAL  Final   Special Requests NONE  Final   Gram Stain MANY WBC SEEN NO ORGANISMS SEEN   Final   Culture NO GROWTH 3 DAYS  Final   Report Status 02/26/2015 FINAL  Final    Coagulation Studies: No results for input(s): LABPROT, INR in the last 72 hours.  Urinalysis:  Recent Labs  03/06/15 0146  COLORURINE YELLOW*  LABSPEC 1.010  PHURINE 9.0*  GLUCOSEU 50*  HGBUR 1+*  BILIRUBINUR NEGATIVE  KETONESUR NEGATIVE  PROTEINUR 100*  NITRITE NEGATIVE  LEUKOCYTESUR NEGATIVE      Imaging: Dg Chest 2 View  03/06/2015   CLINICAL DATA:  Vomiting for 2 weeks. Seen here for same recently. Diffuse abdominal pain  for 2 hours.  EXAM: CHEST  2 VIEW  COMPARISON:  03/02/2015  FINDINGS: Cardiac enlargement with mild pulmonary vascular congestion. Perihilar edema with small bilateral pleural effusions and basilar atelectasis. Similar appearance to previous study. Vascular grafts in both axillary regions.  IMPRESSION: Cardiac enlargement with pulmonary vascular congestion and mild perihilar edema. Bilateral pleural effusions and basilar atelectasis.   Electronically Signed   By: Lucienne Capers M.D.   On: 03/06/2015 04:30   Dg Abd 1 View  03/06/2015   CLINICAL DATA:  Emesis and abdominal pain.  EXAM: ABDOMEN - 1 VIEW  COMPARISON:  03/02/2015  FINDINGS: The bowel gas pattern is normal. No radio-opaque calculi or other significant radiographic abnormality are seen. Surgical clips in the right upper quadrant.  IMPRESSION: Negative.   Electronically Signed   By: Lucienne Capers M.D.   On: 03/06/2015 04:30     Medications:     . amLODipine  10 mg Oral Daily  . atorvastatin  40 mg Oral Daily  . buPROPion  150 mg Oral BID  . carvedilol  25 mg Oral BID  . cholecalciferol  800 Units Oral Daily  . citalopram  20 mg Oral Daily  . furosemide  20 mg Intravenous Once  . heparin  3,000 Units Dialysis Once in dialysis  . heparin  5,000 Units Subcutaneous 3 times per day  . insulin aspart  0-15 Units Subcutaneous TID WC  . labetalol  200 mg Oral BID  . lipase/protease/amylase  24,000 Units Oral With snacks  . lipase/protease/amylase  48,000 Units Oral TID WC  . [START ON 03/08/2015] loratadine  10 mg Oral QODAY  . metoCLOPramide  5 mg Oral TID AC & HS  . pantoprazole  40 mg Oral BID AC  . ranolazine  1,000 mg Oral BID  . sevelamer carbonate  800 mg Oral TID WC  . sodium chloride  3 mL Intravenous Q12H  . sucralfate  1 g Oral TID WC & HS   sodium chloride, acetaminophen **OR** acetaminophen, albuterol, HYDROcodone-acetaminophen, labetalol, morphine injection, nitroGLYCERIN, sodium chloride  Assessment/ Plan:  54 yo  AAF with h/o ESRD, DM (30 yrs with retinopathy, neuropathy), OA, IBS, hyperlipidemia, h/o GI bleed, SHPTH, AOCD, diastolic heart failure , RCA stent Feb 2013, C. diff colitis 7/14, clotted RUE AVF 10/14, admission for shortness of breath 02/20/15. Abdominal pain  N/V June 14  1.  ESRD on HD MWF:    - Regular dialysis treatment today 2.  Nausea/Vomiting-  patient has h/o severe Gastroparesis.   -  GI eval ongoing -  EGD planned 3.  Anemia of CKD: hgb 10.1. Will start EPO next treatment 4.  SHPTH of renal origin:  Monitor phos level   LOS: 1 Skila Rollins 6/15/201610:16 AM

## 2015-03-07 NOTE — Progress Notes (Signed)
[facility] [place] NAME: [name]    MR#:  [id number]  DATE OF BIRTH:  [birth year]  SUBJECTIVE:  Patient was still feeling nauseous this morning. Patient thinks some of her medications as well as delirium. She would like to stop gabapentin and Zofran.  REVIEW OF SYSTEMS:    Review of Systems  Constitutional: Negative for fever, chills and malaise/fatigue.  HENT: Negative for sore throat.   Eyes: Negative for blurred vision.  Respiratory: Negative for cough, hemoptysis, shortness of breath and wheezing.   Cardiovascular: Negative for chest pain, palpitations and leg swelling.  Gastrointestinal: Positive for nausea. Negative for vomiting, abdominal pain, diarrhea and blood in stool.  Genitourinary: Negative for dysuria.  Musculoskeletal: Negative for back pain.  Neurological: Negative for dizziness, tremors and headaches.  Endo/Heme/Allergies: Does not bruise/bleed easily.    Tolerating Diet: Yes clear liquids    DRUG ALLERGIES:   Allergies  Allergen Reactions  . Ace Inhibitors Swelling  . Codeine Nausea And Vomiting  . Compazine [Prochlorperazine Edisylate] Nausea And Vomiting  . Losartan Other (See Comments)  . Phenergan [Promethazine Hcl] Other (See Comments)    Hallucinations  . Zofran [Ondansetron Hcl] Other (See Comments)    Dementia sx  . Oxycodone Anxiety  . Tape Rash    VITALS:  Blood pressure 152/58, pulse 68, temperature 98 F (36.7 C), temperature source Oral, resp. rate 18, height 5\' 5"  (1.651 m), weight 79.8 kg (175 lb 14.8 oz), SpO2 93 %.  PHYSICAL EXAMINATION:   Physical Exam  Constitutional: She is oriented to person, place, and time and well-developed, well-nourished, and in no distress. No distress.  HENT:  Head: Normocephalic.  Eyes: No scleral icterus.  Neck: Normal range of motion. Neck supple. No JVD present. No tracheal deviation present.  Cardiovascular: Normal rate, regular rhythm and  normal heart sounds.  Exam reveals no gallop and no friction rub.   No murmur heard. Pulmonary/Chest: Effort normal and breath sounds normal. No respiratory distress. She has no wheezes. She has no rales. She exhibits no tenderness.  Abdominal: Soft. Bowel sounds are normal. She exhibits no distension and no mass. There is no tenderness. There is no rebound and no guarding.  Musculoskeletal: Normal range of motion. She exhibits edema.  Neurological: She is alert and oriented to person, place, and time.  Skin: Skin is warm. No rash noted. No erythema.  Psychiatric: Affect and judgment normal.      LABORATORY PANEL:   CBC  Recent Labs Lab 03/07/15 1008  WBC 5.1  HGB 9.5*  HCT 29.7*  PLT 114*   ------------------------------------------------------------------------------------------------------------------  Chemistries   Recent Labs Lab 03/05/15 2213  NA 140  K 3.9  CL 97*  CO2 28  GLUCOSE 107*  BUN 25*  CREATININE 6.01*  CALCIUM 8.8*  AST 38  ALT 22  ALKPHOS 100  BILITOT 0.9   ------------------------------------------------------------------------------------------------------------------  Cardiac Enzymes  Recent Labs Lab 03/02/15 0922 03/05/15 2213  TROPONINI <0.03 <0.03   ------------------------------------------------------------------------------------------------------------------  RADIOLOGY:  Dg Chest 2 View  03/06/2015   C.  IMPRESSION: Cardiac enlargement with pulmonary vascular congestion and mild perihilar edema. Bilateral pleural effusions and basilar atelectasis.   Electronically Signed   By: Lucienne Capers M.D.   On: 03/06/2015 04:30   Dg Abd 1 View  03/06/2015   IMPRESSION: Negative.   Electronically Signed   By: Lucienne Capers M.D.   On: 03/06/2015 04:30     ASSESSMENT AND PLAN:  54 year old female with end-stage renal disease on hemodialysis and diabetes who presents with nausea and vomiting.  1. Nausea and vomiting: Patient  continues to have nausea and vomiting. She was seen and evaluated by GI during the last hospitalization. The plan was for her to have an outpatient GI follow-up with possible EGD. Plan is for the patient to undergo EGD. Patient has had cardiac clearance with Dr. Humphrey Rolls. Patient does not want to take Zofran so discontinued this. Patient continue Reglan for possible gastroparesis as the etiology for her nausea and vomiting. GI and cardiology consultation is appreciated. Patient should continue PPI and Carafate.  2. End-stage renal disease and hematemesis: Patient's currently receiving dialysis on Monday, Wednesday and Friday. Appreciate nephrology  consultation. Continue Renvela. 3. Diabetes:Her blood sugar was low this morning. We will continue sliding scale insulin. I will continue to monitor her blood sugars.   4. Coronary artery disease status post MI: Patient will continue Coreg and atorvastatin. aspirin and Plavix on hold for now.   5. GERD: Patient will continue PPI.  6. Accelerated hypertension:  patient's blood pressure is still elevated. This can also cause nausea. I have increased her labetalol to 200 mg by mouth twice a day. I will continue to follow her blood pressure.   Management plans discussed with the patient and she is in agreement.  I have spoken with cardiology and GI.   CODE STATUS: FULL  TOTAL TIME TAKING CARE OF THIS PATIENT: 27 minutes.   Greater than 50% counseling and coordination of care  POSSIBLE D/C 2 days  , DEPENDING ON CLINICAL CONDITION.   Vincy Feliz M.D on 03/07/2015 at 11:41 AM  Between 7am to 6pm - Pager - [phone] After 6pm go to [email or web address] - password [email or web address]  Office  [phone]  CC: Primary care physician; Ellamae Sia, MD

## 2015-03-07 NOTE — Consult Note (Signed)
Pt feels better after dialysis today with better breathing and better lung exam with no rales in bases at this time.  She is expressing pressure of speech and paranoia ideas and I alerted the head nurse for the floor as to this.  Will do EGD tomorrow morning.

## 2015-03-07 NOTE — Consult Note (Addendum)
Cardiology Consultation Note  Patient ID: [name], MRN: [id number], DOB/AGE: June 10, 1961 54 y.o. Admit date: 03/05/2015   Date of Consult: 03/07/2015 Primary Physician: Ellamae Sia, MD Primary Cardiologist: New to [facility]  Chief Complaint: Nausea and vomiting since early 6/13, with recent hospitalization for same  Reason for Consult: Clearance for EGD  HPI: 54 y.o. female with h/o CAD s/p stenting to RCA in 2013, ESRD on HD Monday, Wednesday, and Friday, COPD, IDDM with gastroparesis, HTN,peripheral vascular disease, dementia, and GERD who was recently with PNA on 6/4 with subsequent nausea with multiple episodes of emesis who presented to [facility] on 6/13 with return of vomiting with question of blood mixed in.    She has known CAD s/p stenting to proximal RCA back in 2013. Cardiac cath at that time showed subtotal occlusion of the RCA s/p stenting with normal LV function. Repeat cardiac catheterization in the setting of chest pain on 02/17/2013 showed the stent in the proximal right coronary was patent with just mild restenosis of 30%. Remaining cardiac cath details are described below. She has been kept on aspirin and Plavix by her primary cardiologist. Last echo 10/2013 showed EF 62%, no WMA, GR1DD, mild to moderate TR and MR. She continues to smoke 1 pack of cigarettes daily.   She was recently hospitalized at [name] from 5/30 to 6/4 for HCAP and treated with Levaquin. She required a right-sided paracentesis and left-sided thoracentesis. She subsequently developed nausea with multiple episodes of emesis without diarrhea while in HD. Her dialysis session had to be stopped 30 minutes early 2/2 this. She was discharged on Levaquin. She also reports her dry weight has been decreased lately considerably. She returned to the ED on 6/10 with chest pain that was felt to be 2/2 gastroparesis and discharged from the ED. Troponin was negative at that time x 1. She again returned to [facility] on 6/13 with nausea,  vomiting, and lack of bowel movement. She denied any chest pain.  Upon her arrival to [facility] she was found to have a normal WBC, hgb 10.1, negative troponin, normal lipase, negative plain film of abdomen, CXR with pulmonary vascular congestion and mild perihilar edema. Bilateral pleural effusion. Bilateral atelectasis. She was started on IV Phenergan and Reglan. GI was consulted who felt there was possible need for EGD as she does have history of gastritis with her last EGD being in 2010. At that time there was no H. Pylori, dysplasia, malignancy, or Barretts. She has ben taking ibuprofen at times for some headaches and does use Nexium for PPI. GI did recommend EGD for further evaluation.     Past Medical History  Diagnosis Date  . Asthma   . Collagen vascular disease   . Diabetes mellitus without complication   . Hypertension   . Coronary artery disease     a. cath 2013: stenting to RCA (report not available); b. cath 2014: LM nl, pLAD 40%, mLAD nl, ost LCx 40%, mid LCx nl, pRCA 30% @ site of prior stent, mRCA 50%  . Diabetic neuropathy   . ESRD (end stage renal disease) on dialysis     M-W-F  . GERD (gastroesophageal reflux disease)   . Hx of pancreatitis 2015  . Myocardial infarction   . Pneumonia   . Mitral regurgitation     a. echo 10/2013: EF 62%, noWMA, mildly dilated LA, mild to mod MR/TR, GR1DD  . COPD (chronic obstructive pulmonary disease)       Most Recent Cardiac Studies:  Cardiac cath 02/18/2013:  CORONARY CIRCULATION: The coronary circulation is right dominant. Mid left main: Normal. Proximal LAD: There was a 40 % stenosis. Mid LAD: Normal. Ostial circumflex: There was a 40 % stenosis. Mid circumflex: Normal. Proximal RCA: There was a diffuse 30 % stenosis at the site of a prior stent. Mid RCA: There was a 50 % stenosis.   Echo 11/10/2013:  Ejection fraction of 62%, mildly dilated left atrium, mild to moderate tricuspid regurgitation, mild to moderate mitral  regurgitation, mild pulmonic regurgitation and grade 1 diastolic dysfunction with normal wall motion.   Surgical History:  Past Surgical History  Procedure Laterality Date  . Cholecystectomy    . Appendectomy    . Abdominal hysterectomy    . Dialysis fistula creation Left     upper arm     Home Meds: Prior to Admission medications   Medication Sig Start Date End Date Taking? Authorizing Provider  amLODipine (NORVASC) 10 MG tablet Take 10 mg by mouth daily.   Yes Historical Provider, MD  aspirin EC 81 MG tablet Take 81 mg by mouth daily.   Yes Historical Provider, MD  atorvastatin (LIPITOR) 40 MG tablet Take 40 mg by mouth daily.   Yes Historical Provider, MD  buPROPion (WELLBUTRIN SR) 150 MG 12 hr tablet Take 150 mg by mouth 2 (two) times daily.   Yes Historical Provider, MD  carvedilol (COREG) 25 MG tablet Take 25 mg by mouth 2 (two) times daily.   Yes Historical Provider, MD  cetirizine (ZYRTEC) 10 MG tablet Take 10 mg by mouth daily.   Yes Historical Provider, MD  citalopram (CELEXA) 20 MG tablet Take 20 mg by mouth daily.   Yes Historical Provider, MD  clopidogrel (PLAVIX) 75 MG tablet Take 75 mg by mouth daily.   Yes Historical Provider, MD  Ergocalciferol (VITAMIN D2) 400 UNITS TABS Take 2 tablets by mouth daily.    Yes Historical Provider, MD  gabapentin (NEURONTIN) 100 MG capsule Take 100 mg by mouth daily.   Yes Historical Provider, MD  hydrocortisone 1 % ointment Apply 1 application topically 2 (two) times daily.   Yes Historical Provider, MD  insulin aspart (NOVOLOG) 100 UNIT/ML injection Inject 2-6 Units into the skin 3 (three) times daily with meals. Pt uses as needed per sliding scale.   Yes Historical Provider, MD  insulin detemir (LEVEMIR) 100 UNIT/ML injection Inject 8 Units into the skin at bedtime.   Yes Historical Provider, MD  lipase/protease/amylase (CREON) 12000 UNITS CPEP capsule Take 24,000-48,000 Units by mouth 5 (five) times daily. Pt takes four capsules with  meals and two capsules with snacks.   Yes Historical Provider, MD  nitroGLYCERIN (NITROSTAT) 0.4 MG SL tablet Place 0.4 mg under the tongue every 5 (five) minutes as needed for chest pain.   Yes Historical Provider, MD  Omega-3 Fatty Acids (FISH OIL) 1000 MG CAPS Take 1 capsule by mouth 2 (two) times daily.   Yes Historical Provider, MD  ranitidine (ZANTAC) 150 MG tablet Take 150 mg by mouth 2 (two) times daily.   Yes Historical Provider, MD  ranolazine (RANEXA) 1000 MG SR tablet Take 1,000 mg by mouth 2 (two) times daily.    Yes Historical Provider, MD  sevelamer carbonate (RENVELA) 800 MG tablet Take 800 mg by mouth 3 (three) times daily with meals.   Yes Historical Provider, MD  albuterol (PROVENTIL HFA;VENTOLIN HFA) 108 (90 BASE) MCG/ACT inhaler Inhale 4-6 puffs by mouth every 4 hours as needed for wheezing, cough, and/or  shortness of breath 02/17/15   Hinda Kehr, MD  HYDROcodone-acetaminophen (NORCO/VICODIN) 5-325 MG per tablet Take 1 tablet by mouth every 6 (six) hours as needed for moderate pain. 02/08/15   Earleen Newport, MD  levofloxacin (LEVAQUIN) 500 MG tablet Take 1 tablet (500 mg total) by mouth every other day. 02/24/15   [name], MD  metoCLOPramide (REGLAN) 5 MG tablet Take 1 tablet (5 mg total) by mouth 4 (four) times daily -  before meals and at bedtime. 02/24/15   [name], MD  ondansetron (ZOFRAN) 4 MG tablet Take 1 tablet (4 mg total) by mouth daily as needed for nausea or vomiting. 02/08/15   Earleen Newport, MD  pantoprazole (PROTONIX) 40 MG tablet Take 1 tablet (40 mg total) by mouth 2 (two) times daily before a meal. 02/24/15   [name], MD  sucralfate (CARAFATE) 1 G tablet Take 1 tablet (1 g total) by mouth 4 (four) times daily -  with meals and at bedtime. 02/24/15   [name], MD    Inpatient Medications:  . amLODipine  10 mg Oral Daily  . atorvastatin  40 mg Oral Daily  . buPROPion  150 mg Oral BID  . carvedilol  25 mg Oral BID  . cholecalciferol   800 Units Oral Daily  . citalopram  20 mg Oral Daily  . furosemide  20 mg Intravenous Once  . gabapentin  100 mg Oral Daily  . heparin  3,000 Units Dialysis Once in dialysis  . heparin  5,000 Units Subcutaneous 3 times per day  . insulin aspart  0-15 Units Subcutaneous TID WC  . labetalol  200 mg Oral BID  . lipase/protease/amylase  24,000 Units Oral With snacks  . lipase/protease/amylase  48,000 Units Oral TID WC  . [START ON 03/08/2015] loratadine  10 mg Oral QODAY  . metoCLOPramide  5 mg Oral TID AC & HS  . pantoprazole  40 mg Oral BID AC  . ranolazine  1,000 mg Oral BID  . sevelamer carbonate  800 mg Oral TID WC  . sodium chloride  3 mL Intravenous Q12H  . sucralfate  1 g Oral TID WC & HS      Allergies:  Allergies  Allergen Reactions  . Ace Inhibitors Swelling  . Codeine Nausea And Vomiting  . Compazine [Prochlorperazine Edisylate] Nausea And Vomiting  . Losartan Other (See Comments)  . Phenergan [Promethazine Hcl] Other (See Comments)    Hallucinations  . Zofran [Ondansetron Hcl] Other (See Comments)    Dementia sx  . Oxycodone Anxiety  . Tape Rash    History   Social History  . Marital Status: Divorced    Spouse Name: N/A  . Number of Children: N/A  . Years of Education: N/A   Occupational History  . Not on file.   Social History Main Topics  . Smoking status: Former Smoker -- 0.50 packs/day    Types: Cigarettes    Quit date: 02/13/2015  . Smokeless tobacco: Never Used  . Alcohol Use: No  . Drug Use: No  . Sexual Activity: Not on file   Other Topics Concern  . Not on file   Social History Narrative     Family History  Problem Relation Age of Onset  . Kidney disease Mother   . Diabetes Mother      Review of Systems: Review of Systems  Constitutional: Positive for weight loss and malaise/fatigue. Negative for fever, chills and diaphoresis.  HENT: Negative for congestion.  Eyes: Negative for pain, discharge and redness.  Respiratory:  Negative for cough, hemoptysis, sputum production, shortness of breath and wheezing.   Cardiovascular: Negative for chest pain, palpitations, orthopnea, claudication, leg swelling and PND.  Gastrointestinal: Positive for nausea and vomiting. Negative for heartburn, abdominal pain, diarrhea, constipation, blood in stool and melena.  Musculoskeletal: Positive for myalgias. Negative for falls.  Skin: Negative for itching and rash.  Neurological: Positive for weakness. Negative for dizziness, sensory change, speech change, focal weakness and headaches.  Endo/Heme/Allergies: Bruises/bleeds easily.  Psychiatric/Behavioral: The patient is nervous/anxious.   All other systems reviewed and are negative.   Labs:  Recent Labs  03/05/15 2213 03/05/15 2217  CKTOTAL  --  56  TROPONINI <0.03  --    Lab Results  Component Value Date   WBC 5.2 03/05/2015   HGB 10.1* 03/05/2015   HCT 32.1* 03/05/2015   MCV 85.2 03/05/2015   PLT 119* 03/05/2015     Recent Labs Lab 03/05/15 2213  NA 140  K 3.9  CL 97*  CO2 28  BUN 25*  CREATININE 6.01*  CALCIUM 8.8*  PROT 6.9  BILITOT 0.9  ALKPHOS 100  ALT 22  AST 38  GLUCOSE 107*   Lab Results  Component Value Date   CHOL 123 02/21/2015   TRIG 72 02/21/2015   No results found for: DDIMER  Radiology/Studies:  Dg Chest 1 View  02/21/2015   CLINICAL DATA:  Thoracentesis  EXAM: CHEST  1 VIEW  COMPARISON:  02/20/2015  FINDINGS: There is no evidence of pneumothorax. Cardiomegaly is stable. Bibasilar opacity is stable.  IMPRESSION: No pneumothorax post thoracentesis.  Persistent bibasilar opacity.   Electronically Signed   By: Marybelle Killings M.D.   On: 02/21/2015 17:25   Dg Chest 1 View  02/20/2015   CLINICAL DATA:  Status post right-sided thoracentesis.  EXAM: CHEST  1 VIEW  COMPARISON:  Chest radiograph 02/19/2015  FINDINGS: Interval decrease in size of the right pleural effusion, status post right thoracentesis today. No visible pneumothorax. Vascular  stent in the left subclavian and left axillary region. Vascular stent in the right axillary region.  Stable heart mediastinal contours. Heart size appears mildly enlarged for portable technique.  There are mild hazy opacities throughout both lungs. Cardiac leads project of the chest.  IMPRESSION: 1. Negative for pneumothorax. 2. Mild hazy opacities throughout both lungs. Cannot exclude mild symmetric airspace disease.   Electronically Signed   By: Curlene Dolphin M.D.   On: 02/20/2015 17:04   Dg Chest 1 View  02/19/2015   CLINICAL DATA:  Shortness of breath, possible pneumonia  EXAM: CHEST  1 VIEW  COMPARISON:  Frontal chest radiograph dated 02/19/2015 at 1155 hr  FINDINGS: Layering pleural effusion, likely right greater than left when correlating with the frontal radiograph.  Patchy lower lobe opacity, likely right lower lobe atelectasis when corresponding with the frontal radiograph.  IMPRESSION: Right greater than left pleural effusions with right lower lobe atelectasis.   Electronically Signed   By: Julian Hy M.D.   On: 02/19/2015 13:34   Dg Chest 2 View  03/06/2015   CLINICAL DATA:  Vomiting for 2 weeks. Seen here for same recently. Diffuse abdominal pain for 2 hours.  EXAM: CHEST  2 VIEW  COMPARISON:  03/02/2015  FINDINGS: Cardiac enlargement with mild pulmonary vascular congestion. Perihilar edema with small bilateral pleural effusions and basilar atelectasis. Similar appearance to previous study. Vascular grafts in both axillary regions.  IMPRESSION: Cardiac enlargement with pulmonary vascular congestion  and mild perihilar edema. Bilateral pleural effusions and basilar atelectasis.   Electronically Signed   By: Lucienne Capers M.D.   On: 03/06/2015 04:30   Dg Chest 2 View  02/17/2015   CLINICAL DATA:  Shortness of Breath  EXAM: CHEST  2 VIEW  COMPARISON:  05/30/2014  FINDINGS: Cardiac shadow it is mildly enlarged but stable. Stenting is noted throughout the left arm venous tree. Elevation of  the right hemidiaphragm is again seen. Early right basilar infiltrate is identified predominately within the lower lobe. No other focal abnormality is noted.  IMPRESSION: Early right basilar infiltrate.   Electronically Signed   By: Inez Catalina M.D.   On: 02/17/2015 18:28   Dg Abd 1 View  03/06/2015   CLINICAL DATA:  Emesis and abdominal pain.  EXAM: ABDOMEN - 1 VIEW  COMPARISON:  03/02/2015  FINDINGS: The bowel gas pattern is normal. No radio-opaque calculi or other significant radiographic abnormality are seen. Surgical clips in the right upper quadrant.  IMPRESSION: Negative.   Electronically Signed   By: Lucienne Capers M.D.   On: 03/06/2015 04:30   Ct Head Wo Contrast  02/06/2015   CLINICAL DATA:  Headache and dizziness for 3 months, knots on head and arms, history asthma, collagen vascular disease, diabetes, hypertension, renal insufficiency  EXAM: CT HEAD WITHOUT CONTRAST  TECHNIQUE: Contiguous axial images were obtained from the base of the skull through the vertex without intravenous contrast.  COMPARISON:  01/06/2012  FINDINGS: Normal ventricular morphology.  No midline shift or mass effect.  Normal appearance of brain parenchyma.  No intracranial hemorrhage, mass lesion or evidence of acute infarction.  No extra-axial fluid collections.  Atherosclerotic calcification of internal carotid and vertebral arteries at skullbase.  Sinuses clear and bones unremarkable.  IMPRESSION: No acute intracranial abnormalities.   Electronically Signed   By: Lavonia Dana M.D.   On: 02/06/2015 15:29   Ct Abdomen Pelvis W Contrast  02/22/2015   CLINICAL DATA:  Generalized abdominal pain, nausea and vomiting, history of pancreatitis  EXAM: CT ABDOMEN AND PELVIS WITH CONTRAST  TECHNIQUE: Multidetector CT imaging of the abdomen and pelvis was performed using the standard protocol following bolus administration of intravenous contrast.  CONTRAST:  118mL OMNIPAQUE IOHEXOL 300 MG/ML  SOLN  COMPARISON:  03/31/2013   FINDINGS: Sagittal images of the spine shows mild degenerative changes lower thoracic spine. There is mild anasarca infiltration of subcutaneous fat abdominal and pelvic wall.  Small right pleural effusion. Trace left pleural effusion. Mild basilar posterior atelectasis. Enhanced liver shows no focal mass. The patient is status postcholecystectomy. The pancreas and spleen is unremarkable. Adrenal glands are unremarkable. Again noted renal atrophy bilaterally. No hydronephrosis or hydroureter. There is global mild decreased enhancement bilateral kidneys. There is delay excretion bilateral kidneys. Findings surgery decrease renal function bilaterally. Clinical correlation is necessary.  No aortic aneurysm.  Small hiatal hernia.  Atherosclerotic calcifications of abdominal aorta and SMA.  Moderate stool noted in hepatic flexure of the colon. There is no pericecal inflammation.  Scattered diverticula are noted descending colon and sigmoid colon. There is minimal thickening of colonic wall in splenic flexure of the colon. Minimal colitis cannot be excluded. Mild thickening of colonic wall in sigmoid colon and rectum. Mild colitis cannot be excluded. There is no mesenteric or pericolonic abscess. No diverticular abscess is noted.  The patient is status post hysterectomy. The patient is status post appendectomy. A right inguinal lymph node measures 2.2 x 1.5 cm. Left inguinal lymph node measures  2.3 by 1.3 cm.  IMPRESSION: 1. There is small right pleural effusion. Trace left pleural effusion. Mild bilateral basilar posterior atelectasis. 2. Status postcholecystectomy. 3. Again noted bilateral renal atrophy. No hydronephrosis or hydroureter. 4. Minimal thickening of colonic wall in splenic flexure of the colon. Mild thickening of colonic wall in sigmoid colon and rectum. Mild colitis cannot be excluded. Scattered diverticula are noted in descending colon and sigmoid colon. No evidence of acute diverticulitis. No pericolonic  abscess. No diverticular abscess. 5. Status post appendectomy.  No pericecal inflammation. 6. Status post hysterectomy. 7. Bilateral decrease parenchymal enhancement and excretion of the kidneys suggesting bilateral decreased renal function. Clinical correlation is necessary   Electronically Signed   By: Lahoma Crocker M.D.   On: 02/22/2015 16:58   US Abdomen Limited  02/21/2015   CLINICAL DATA:  Ascites.  EXAM: LIMITED ABDOMEN ULTRASOUND FOR ASCITES  TECHNIQUE: Limited ultrasound survey for ascites was performed in all four abdominal quadrants.  COMPARISON:  02/21/2015.  FINDINGS: There is no ascites identified in all 4 quadrants evaluated.  IMPRESSION: Negative for ascites.   Electronically Signed   By: Dereck Ligas M.D.   On: 02/21/2015 18:17   Dg Chest Portable 1 View  02/19/2015   CLINICAL DATA:  Dialysis patient.  Shortness of breath.  EXAM: PORTABLE CHEST - 1 VIEW  COMPARISON:  02/17/2015  FINDINGS: Normal heart size. There are small bilateral pleural effusions noted right greater in left. These are mildly increased in volume from previous exam. No airspace consolidation identified. The visualized osseous structures are unremarkable.  IMPRESSION: Mild increase in volume of pleural effusions.  No active disease.   Electronically Signed   By: Kerby Moors M.D.   On: 02/19/2015 12:35   Dg Abd Acute W/chest  03/02/2015   CLINICAL DATA:  Chest pain  EXAM: DG ABDOMEN ACUTE W/ 1V CHEST  COMPARISON:  02/21/2015 chest x-ray  FINDINGS: There is interstitial opacity and small bilateral pleural effusion. Superimposed dense opacity in the left lower lobe with air bronchograms that are crowded. Chronic cardiomegaly. Extensive venous stenting on the left.  Bowel gas pattern is nonobstructive. No concerning intra-abdominal mass effect or calcification. Retained oral contrast within the left colon, outlining diverticula.  IMPRESSION: 1. Pulmonary edema and small pleural effusions. 2. Left lower lobe pneumonia or  atelectasis 3. No acute intra-abdominal findings.   Electronically Signed   By: Monte Fantasia M.D.   On: 03/02/2015 12:18   US Thoracentesis Asp Pleural Space W/img Guide  02/21/2015   CLINICAL DATA:  Shortness of breath.  EXAM: ULTRASOUND GUIDED LEFT THORACENTESIS  COMPARISON:  Chest radiograph 02/20/2015  PROCEDURE: An ultrasound guided thoracentesis was thoroughly discussed with the patient and questions answered. The benefits, risks, alternatives and complications were also discussed. The patient understands and wishes to proceed with the procedure. Written consent was obtained.  Ultrasound was performed to localize and mark an adequate pocket of fluid in the left posterior chest. The area was then prepped and draped in the normal sterile fashion. 1% Lidocaine was used for local anesthesia. Under ultrasound guidance a 19 gauge Yueh catheter was introduced. Thoracentesis was performed. The catheter was removed and a dressing applied.  COMPLICATIONS: None.  FINDINGS: A total of approximately 400 mL of amber color fluid was removed. A fluid sample was sent for laboratory analysis.  IMPRESSION: Successful ultrasound guided left thoracentesis yielding 400 mL of pleural fluid.   Electronically Signed   By: Markus Daft M.D.   On: 02/21/2015 17:13  US Thoracentesis Asp Pleural Space W/img Guide  02/20/2015   CLINICAL DATA:  Right pleural effusion  EXAM: ULTRASOUND GUIDED RIGHT THORACENTESIS  COMPARISON:  None.  PROCEDURE: An ultrasound guided thoracentesis was thoroughly discussed with the patient and questions answered. The benefits, risks, alternatives and complications were also discussed. The patient understands and wishes to proceed with the procedure. Written consent was obtained.  Ultrasound was performed to localize and mark an adequate pocket of fluid in the right chest. The area was then prepped and draped in the normal sterile fashion. 1% Lidocaine was used for local anesthesia. Under ultrasound  guidance a Safe-T-Centesis catheter was introduced. Thoracentesis was performed. The catheter was removed and a dressing applied.  COMPLICATIONS: None.  FINDINGS: A total of approximately 850 cc of clear fluid was removed. A fluid sample wassent for laboratory analysis.  IMPRESSION: Successful ultrasound guided right thoracentesis yielding 850 cc of pleural fluid.   Electronically Signed   By: Marybelle Killings M.D.   On: 02/20/2015 17:16    EKG: diffuse baseline artifact, NSR, 66 bpm, prolonged QT 454, no significant st/t changes   Weights: Filed Weights   03/06/15 0758 03/06/15 1330 03/07/15 0100  Weight: 187 lb 8 oz (85.049 kg) 178 lb 9.2 oz (81 kg) 176 lb 1.6 oz (79.878 kg)     Physical Exam: Blood pressure 166/70, pulse 63, temperature 97.8 F (36.6 C), temperature source Oral, resp. rate 18, height 5\' 5"  (1.651 m), weight 176 lb 1.6 oz (79.878 kg), SpO2 95 %. Body mass index is 29.3 kg/(m^2). General: Well developed, well nourished, in no acute distress. Head: Normocephalic, atraumatic, sclera non-icteric, no xanthomas, nares are without discharge.  Neck: Negative for carotid bruits. JVD not elevated. Lungs: Clear bilaterally to auscultation without wheezes, rales, or rhonchi. Breathing is unlabored. Heart: RRR with S1 S2. No murmurs, rubs, or gallops appreciated. Abdomen: Soft, non-tender, non-distended with normoactive bowel sounds. No hepatomegaly. No rebound/guarding. No obvious abdominal masses. Msk:  Strength and tone appear normal for age. Extremities: No clubbing or cyanosis. No edema.  Distal pedal pulses are 2+ and equal bilaterally. Multiple bruises along extremities.  Neuro: Alert and oriented X 3. No facial asymmetry. No focal deficit. Moves all extremities spontaneously. Psych:  Responds to questions appropriately with a normal affect.    Assessment and Plan:  54 y.o. female with h/o CAD s/p stenting to RCA in 2013, ESRD on HD Monday, Wednesday, and Friday, COPD, IDDM with  gastroparesis, HTN, peripheral vascular disease, dementia, and GERD who was recently with PNA on 6/4 with subsequent nausea with multiple episodes of emesis who presented to [facility] on 6/13 with return of vomiting.  1. CAD s/p remote stenting to proximal RCA in 2013: -No recent stenting within the past 12 months -Last cardiac cath 01/2013 showed patent stent with diffuse 30% stenosis at the site of prior stent, pLAD 40%, ost LCx 40%, mRCA 50% -Will be able to hold Plavix at this time for GI procedure given no recent stenting with moderate cardiac risk  -Would continue aspirin 81 mg when able per GI -Continue Coreg 25 mg bid, Lipitor 40 mg -Most recent echo 10/2013 with normal LV function and no wall motion abnormalities   2. Nausea and vomiting: -Patient with a history of diabetic gastroparesis  -GI planning for possible EGD, possible inpatient vs outpatient procedure -Plavix and aspirin are held as above -Continue Reglan, Protonix, and Carafate  3. ESRD on HD: -Monday, Wednesday, and Friday -Dry weights have recently been decreased possibly  contributing to her symptoms -Volume managed by HD - bilateral pleural effusions and perihilar edema seen on CXR  4. HTN: -Improving -Continue to monitor  5. Dementia: -Uncertain what her baseline is  6. COPD: -Appears to be stable  7. IDDM: -Per IM  Signed, Christell Faith, PA-C Pager: [phone] 03/07/2015, 7:55 AM   Attending Note Patient seen and examined, agree with detailed note above,  Patient presentation and plan discussed on rounds.   Patient presenting with nausea and vomiting Followed by GI Plan is for EGD. She'll be acceptable risk to hold her Plavix. No recent PCI. No unstable angina. No further testing needed Would restart Plavix following procedure She has outpatient cardiology at [facility]  Signed: Esmond Plants  M.D., Ph.D. Signed 03/07/2015

## 2015-03-07 NOTE — Progress Notes (Signed)
[name] is a 54 y.o. female  [id number]  Primary Cardiologist: Neoma Laming  Reason for Consultation: Preoperative clearance  HPI: 89 YOBF came with nausia and vomiting and occasional chest pains. She is feeling much better now and is getting dialysis. She denies chest or shortness of br   Past Medical History  Diagnosis Date  . Asthma   . Collagen vascular disease   . Diabetes mellitus without complication   . Hypertension   . Coronary artery disease     a. cath 2013: stenting to RCA (report not available); b. cath 2014: LM nl, pLAD 40%, mLAD nl, ost LCx 40%, mid LCx nl, pRCA 30% @ site of prior stent, mRCA 50%  . Diabetic neuropathy   . ESRD (end stage renal disease) on dialysis     M-W-F  . GERD (gastroesophageal reflux disease)   . Hx of pancreatitis 2015  . Myocardial infarction   . Pneumonia   . Mitral regurgitation     a. echo 10/2013: EF 62%, noWMA, mildly dilated LA, mild to mod MR/TR, GR1DD  . COPD (chronic obstructive pulmonary disease)     Medications Prior to Admission  Medication Sig Dispense Refill  . amLODipine (NORVASC) 10 MG tablet Take 10 mg by mouth daily.    Marland Kitchen aspirin EC 81 MG tablet Take 81 mg by mouth daily.    Marland Kitchen atorvastatin (LIPITOR) 40 MG tablet Take 40 mg by mouth daily.    Marland Kitchen buPROPion (WELLBUTRIN SR) 150 MG 12 hr tablet Take 150 mg by mouth 2 (two) times daily.    . carvedilol (COREG) 25 MG tablet Take 25 mg by mouth 2 (two) times daily.    . cetirizine (ZYRTEC) 10 MG tablet Take 10 mg by mouth daily.    . citalopram (CELEXA) 20 MG tablet Take 20 mg by mouth daily.    . clopidogrel (PLAVIX) 75 MG tablet Take 75 mg by mouth daily.    . Ergocalciferol (VITAMIN D2) 400 UNITS TABS Take 2 tablets by mouth daily.     Marland Kitchen gabapentin (NEURONTIN) 100 MG capsule Take 100 mg by mouth daily.    . hydrocortisone 1 % ointment Apply 1 application topically 2 (two) times daily.    . insulin aspart (NOVOLOG) 100 UNIT/ML injection Inject 2-6 Units into the  skin 3 (three) times daily with meals. Pt uses as needed per sliding scale.    . insulin detemir (LEVEMIR) 100 UNIT/ML injection Inject 8 Units into the skin at bedtime.    . lipase/protease/amylase (CREON) 12000 UNITS CPEP capsule Take 24,000-48,000 Units by mouth 5 (five) times daily. Pt takes four capsules with meals and two capsules with snacks.    . nitroGLYCERIN (NITROSTAT) 0.4 MG SL tablet Place 0.4 mg under the tongue every 5 (five) minutes as needed for chest pain.    . Omega-3 Fatty Acids (FISH OIL) 1000 MG CAPS Take 1 capsule by mouth 2 (two) times daily.    . ranitidine (ZANTAC) 150 MG tablet Take 150 mg by mouth 2 (two) times daily.    . ranolazine (RANEXA) 1000 MG SR tablet Take 1,000 mg by mouth 2 (two) times daily.     . sevelamer carbonate (RENVELA) 800 MG tablet Take 800 mg by mouth 3 (three) times daily with meals.    Marland Kitchen albuterol (PROVENTIL HFA;VENTOLIN HFA) 108 (90 BASE) MCG/ACT inhaler Inhale 4-6 puffs by mouth every 4 hours as needed for wheezing, cough, and/or shortness of breath 1 Inhaler 1  .  HYDROcodone-acetaminophen (NORCO/VICODIN) 5-325 MG per tablet Take 1 tablet by mouth every 6 (six) hours as needed for moderate pain. 20 tablet 0  . levofloxacin (LEVAQUIN) 500 MG tablet Take 1 tablet (500 mg total) by mouth every other day. 5 tablet 0  . metoCLOPramide (REGLAN) 5 MG tablet Take 1 tablet (5 mg total) by mouth 4 (four) times daily -  before meals and at bedtime. 40 tablet 0  . ondansetron (ZOFRAN) 4 MG tablet Take 1 tablet (4 mg total) by mouth daily as needed for nausea or vomiting. 20 tablet 1  . pantoprazole (PROTONIX) 40 MG tablet Take 1 tablet (40 mg total) by mouth 2 (two) times daily before a meal. 60 tablet 0  . sucralfate (CARAFATE) 1 G tablet Take 1 tablet (1 g total) by mouth 4 (four) times daily -  with meals and at bedtime. 120 tablet 0     . amLODipine  10 mg Oral Daily  . atorvastatin  40 mg Oral Daily  . buPROPion  150 mg Oral BID  . carvedilol  25 mg  Oral BID  . cholecalciferol  800 Units Oral Daily  . citalopram  20 mg Oral Daily  . furosemide  20 mg Intravenous Once  . heparin  3,000 Units Dialysis Once in dialysis  . heparin  5,000 Units Subcutaneous 3 times per day  . insulin aspart  0-15 Units Subcutaneous TID WC  . labetalol  200 mg Oral BID  . lipase/protease/amylase  24,000 Units Oral With snacks  . lipase/protease/amylase  48,000 Units Oral TID WC  . [START ON 03/08/2015] loratadine  10 mg Oral QODAY  . metoCLOPramide  5 mg Oral TID AC & HS  . pantoprazole  40 mg Oral BID AC  . ranolazine  1,000 mg Oral BID  . sevelamer carbonate  800 mg Oral TID WC  . sodium chloride  3 mL Intravenous Q12H  . sucralfate  1 g Oral TID WC & HS    Infusions:    Allergies  Allergen Reactions  . Ace Inhibitors Swelling  . Codeine Nausea And Vomiting  . Compazine [Prochlorperazine Edisylate] Nausea And Vomiting  . Losartan Other (See Comments)  . Phenergan [Promethazine Hcl] Other (See Comments)    Hallucinations  . Zofran [Ondansetron Hcl] Other (See Comments)    Dementia sx  . Oxycodone Anxiety  . Tape Rash    History   Social History  . Marital Status: Divorced    Spouse Name: N/A  . Number of Children: N/A  . Years of Education: N/A   Occupational History  . Not on file.   Social History Main Topics  . Smoking status: Former Smoker -- 0.50 packs/day    Types: Cigarettes    Quit date: 02/13/2015  . Smokeless tobacco: Never Used  . Alcohol Use: No  . Drug Use: No  . Sexual Activity: Not on file   Other Topics Concern  . Not on file   Social History Narrative    Family History  Problem Relation Age of Onset  . Kidney disease Mother   . Diabetes Mother     PHYSICAL EXAM: Filed Vitals:   03/07/15 1000  BP: 160/63  Pulse: 65  Temp:   Resp: 13     Intake/Output Summary (Last 24 hours) at 03/07/15 1032 Last data filed at 03/07/15 0830  Gross per 24 hour  Intake    600 ml  Output   2000 ml  Net   -1400 ml  General:  Well appearing. No respiratory difficulty HEENT: normal Neck: supple. no JVD. Carotids 2+ bilat; no bruits. No lymphadenopathy or thryomegaly appreciated. Cor: PMI nondisplaced. Regular rate & rhythm. No rubs, gallops or murmurs. Lungs: clear Abdomen: soft, nontender, nondistended. No hepatosplenomegaly. No bruits or masses. Good bowel sounds. Extremities: no cyanosis, clubbing, rash, edema Neuro: alert & oriented x 3, cranial nerves grossly intact. moves all 4 extremities w/o difficulty. Affect pleasant.  ECG: NSR 66/min no acute changes.  Results for orders placed or performed during the hospital encounter of 03/05/15 (from the past 24 hour(s))  Glucose, capillary     Status: Abnormal   Collection Time: 03/06/15  5:15 PM  Result Value Ref Range   Glucose-Capillary 62 (L) 65 - 99 mg/dL   Comment 1 Notify RN   Glucose, capillary     Status: Abnormal   Collection Time: 03/06/15  9:23 PM  Result Value Ref Range   Glucose-Capillary 64 (L) 65 - 99 mg/dL   Comment 1 Notify RN   Glucose, capillary     Status: None   Collection Time: 03/07/15  1:11 AM  Result Value Ref Range   Glucose-Capillary 84 65 - 99 mg/dL  Glucose, capillary     Status: None   Collection Time: 03/07/15  2:41 AM  Result Value Ref Range   Glucose-Capillary 75 65 - 99 mg/dL  Glucose, capillary     Status: Abnormal   Collection Time: 03/07/15  7:51 AM  Result Value Ref Range   Glucose-Capillary 56 (L) 65 - 99 mg/dL   Comment 1 Notify RN   CBC     Status: Abnormal   Collection Time: 03/07/15 10:08 AM  Result Value Ref Range   WBC 5.1 3.6 - 11.0 K/uL   RBC 3.53 (L) 3.80 - 5.20 MIL/uL   Hemoglobin 9.5 (L) 12.0 - 16.0 g/dL   HCT 29.7 (L) 35.0 - 47.0 %   MCV 84.3 80.0 - 100.0 fL   MCH 26.9 26.0 - 34.0 pg   MCHC 31.9 (L) 32.0 - 36.0 g/dL   RDW 21.3 (H) 11.5 - 14.5 %   Platelets 114 (L) 150 - 440 K/uL   Dg Chest 2 View  03/06/2015   CLINICAL DATA:  Vomiting for 2 weeks. Seen here for same  recently. Diffuse abdominal pain for 2 hours.  EXAM: CHEST  2 VIEW  COMPARISON:  03/02/2015  FINDINGS: Cardiac enlargement with mild pulmonary vascular congestion. Perihilar edema with small bilateral pleural effusions and basilar atelectasis. Similar appearance to previous study. Vascular grafts in both axillary regions.  IMPRESSION: Cardiac enlargement with pulmonary vascular congestion and mild perihilar edema. Bilateral pleural effusions and basilar atelectasis.   Electronically Signed   By: Lucienne Capers M.D.   On: 03/06/2015 04:30   Dg Abd 1 View  03/06/2015   CLINICAL DATA:  Emesis and abdominal pain.  EXAM: ABDOMEN - 1 VIEW  COMPARISON:  03/02/2015  FINDINGS: The bowel gas pattern is normal. No radio-opaque calculi or other significant radiographic abnormality are seen. Surgical clips in the right upper quadrant.  IMPRESSION: Negative.   Electronically Signed   By: Lucienne Capers M.D.   On: 03/06/2015 04:30     ASSESSMENT AND PLAN: CAD/CHF/s/p PCI  Mid RCA in 2013, and stable clinically and no acute EKG changes with troponins negative. Advise proceeding with EGD/colonosopy as is low risk.  Makendra Vigeant A

## 2015-03-08 ENCOUNTER — Inpatient Hospital Stay: Payer: Medicare Other | Admitting: Anesthesiology

## 2015-03-08 ENCOUNTER — Encounter
Admission: EM | Disposition: A | Discharge status: Home / Self Care | Payer: Self-pay | Source: Home / Self Care | Attending: Internal Medicine

## 2015-03-08 ENCOUNTER — Encounter: Payer: Self-pay | Admitting: *Deleted

## 2015-03-08 ENCOUNTER — Ambulatory Visit: Admit: 2015-03-08 | Payer: Self-pay | Admitting: Unknown Physician Specialty

## 2015-03-08 DIAGNOSIS — F458 Other somatoform disorders: Secondary | ICD-10-CM

## 2015-03-08 DIAGNOSIS — F451 Undifferentiated somatoform disorder: Secondary | ICD-10-CM

## 2015-03-08 HISTORY — PX: ESOPHAGOGASTRODUODENOSCOPY: SHX5428

## 2015-03-08 LAB — GLUCOSE, CAPILLARY
Glucose-Capillary: 91 mg/dL (ref 65–99)
Glucose-Capillary: 90 mg/dL (ref 65–99)
Glucose-Capillary: 85 mg/dL (ref 65–99)
Glucose-Capillary: 109 mg/dL — ABNORMAL HIGH (ref 65–99)

## 2015-03-08 SURGERY — EGD (ESOPHAGOGASTRODUODENOSCOPY)
Anesthesia: General

## 2015-03-08 SURGERY — EGD (ESOPHAGOGASTRODUODENOSCOPY)
Anesthesia: Monitor Anesthesia Care

## 2015-03-08 MED ORDER — LACTATED RINGERS IV SOLN
INTRAVENOUS | Status: DC | PRN
  Administered 2015-03-08: 14:00:00 via INTRAVENOUS

## 2015-03-08 MED ORDER — PROPOFOL INFUSION 10 MG/ML OPTIME
INTRAVENOUS | Status: DC | PRN
  Administered 2015-03-08: 100 ug/kg/min via INTRAVENOUS

## 2015-03-08 MED ORDER — PROPOFOL 10 MG/ML IV BOLUS
INTRAVENOUS | Status: DC | PRN
  Administered 2015-03-08: 50 mg via INTRAVENOUS

## 2015-03-08 MED ORDER — LABETALOL HCL 100 MG PO TABS
100.0000 mg | ORAL_TABLET | Freq: Two times a day (BID) | ORAL | Status: DC
  Administered 2015-03-08: 100 mg via ORAL
  Filled 2015-03-08: qty 1

## 2015-03-08 MED ORDER — HYDRALAZINE HCL 50 MG PO TABS
100.0000 mg | ORAL_TABLET | Freq: Four times a day (QID) | ORAL | Status: DC
  Administered 2015-03-08 – 2015-03-09 (×3): 100 mg via ORAL
  Filled 2015-03-08 (×3): qty 2

## 2015-03-08 MED ORDER — METOCLOPRAMIDE HCL 5 MG PO TABS
5.0000 mg | ORAL_TABLET | Freq: Every day | ORAL | Status: DC
  Administered 2015-03-08: 5 mg via ORAL
  Filled 2015-03-08: qty 1

## 2015-03-08 NOTE — Transfer of Care (Signed)
Immediate Anesthesia Transfer of Care Note  Patient: [name]  Procedure(s) Performed: Procedure(s): ESOPHAGOGASTRODUODENOSCOPY (EGD) (N/A)  Patient Location: PACU and Endoscopy Unit  Anesthesia Type:General  Level of Consciousness: awake, alert  and oriented  Airway & Oxygen Therapy: Patient Spontanous Breathing and Patient connected to nasal cannula oxygen  Post-op Assessment: Report given to RN and Post -op Vital signs reviewed and stable  Post vital signs: Reviewed and stable  Last Vitals:  Filed Vitals:   03/08/15 1333  BP: 112/52  Pulse: 61  Temp: 36.2 C  Resp: 20    Complications: No apparent anesthesia complications

## 2015-03-08 NOTE — Anesthesia Preprocedure Evaluation (Addendum)
Anesthesia Evaluation  Patient identified by MRN, date of birth, ID band Patient awake    Reviewed: Allergy & Precautions, NPO status , Patient's Chart, lab work & pertinent test results, reviewed documented beta blocker date and time   Airway Mallampati: II  TM Distance: >3 FB     Dental   Pulmonary asthma , pneumonia -, COPDformer smoker,          Cardiovascular hypertension, + CAD and + Past MI     Neuro/Psych    GI/Hepatic   Endo/Other  diabetes  Renal/GU ESRFRenal disease     Musculoskeletal   Abdominal   Peds  Hematology   Anesthesia Other Findings   Reproductive/Obstetrics                            Anesthesia Physical Anesthesia Plan  ASA: III  Anesthesia Plan: General   Post-op Pain Management:    Induction: Intravenous  Airway Management Planned: Nasal Cannula  Additional Equipment:   Intra-op Plan:   Post-operative Plan:   Informed Consent: I have reviewed the patients History and Physical, chart, labs and discussed the procedure including the risks, benefits and alternatives for the proposed anesthesia with the patient or authorized representative who has indicated his/her understanding and acceptance.     Plan Discussed with: CRNA  Anesthesia Plan Comments:        Anesthesia Quick Evaluation

## 2015-03-08 NOTE — Op Note (Signed)
[facility] Patient Name: [name] Procedure Date: 03/08/2015 1:52 PM MRN: [id number] Account #: [id number] Date of Birth: 07-05-1961 Admit Type: Outpatient Age: 54 Room: [facility] ENDO ROOM 1 Gender: Female Note Status: Finalized Procedure:         Upper GI endoscopy Indications:       Epigastric abdominal pain, Generalized abdominal pain,                     Nausea with vomiting Providers:         Manya Silvas, MD Referring MD:      Ellin Goodie, MD (Referring MD) Medicines:         Propofol per Anesthesia Complications:     No immediate complications. Procedure:         Pre-Anesthesia Assessment:                    - After reviewing the risks and benefits, the patient was                     deemed in satisfactory condition to undergo the procedure.                    After obtaining informed consent, the endoscope was passed                     under direct vision. Throughout the procedure, the                     patient's blood pressure, pulse, and oxygen saturations                     were monitored continuously. The Olympus GIF-160 endoscope                     (S#. [id number]) was introduced through the mouth, and                     advanced to the second part of duodenum. The upper GI                     endoscopy was somewhat difficult due to patient                     intolerance of esophageal intubation. The patient                     tolerated the procedure well. Findings:      Hypopharynx with small amt of blood from difficulty swallowing.      The examined esophagus was normal.      Localized moderate inflammation characterized by erosions, erythema and       granularity was found in the gastric antrum. Pills noted in stomach.      The examined duodenum was normal. Impression:        - Normal esophagus.                    - Gastritis.                    - Normal examined duodenum.                    - No specimens  collected. Recommendation:    - The findings and recommendations were discussed with the  patient. And hospitalist. Carafate slurry and PPI, soft                     diet. Manya Silvas, MD 03/08/2015 2:09:06 PM This report has been signed electronically. Number of Addenda: 0 Note Initiated On: 03/08/2015 1:52 PM      [facility]

## 2015-03-08 NOTE — Progress Notes (Signed)
Dr. Benjie Karvonen paged and notified of pt being NPO after EGD and requesting diet. Orders received for renal diet.

## 2015-03-08 NOTE — Progress Notes (Signed)
SUBJECTIVE: has sore throat and horsness   Filed Vitals:   03/07/15 2354 03/08/15 0404 03/08/15 0541 03/08/15 0750  BP: 129/56  129/56 139/57  Pulse: 61  61 64  Temp: 98 F (36.7 C)   98.2 F (36.8 C)  TempSrc: Oral   Oral  Resp:    17  Height:      Weight:  81.874 kg (180 lb 8 oz)    SpO2: 100%   100%    Intake/Output Summary (Last 24 hours) at 03/08/15 1040 Last data filed at 03/08/15 0700  Gross per 24 hour  Intake    120 ml  Output   1000 ml  Net   -880 ml    LABS: Basic Metabolic Panel:  Recent Labs  03/05/15 2213 03/07/15 1008  NA 140 137  K 3.9 3.5  CL 97* 100*  CO2 28 30  GLUCOSE 107* 87  BUN 25* 15  CREATININE 6.01* 4.30*  CALCIUM 8.8* 8.3*   Liver Function Tests:  Recent Labs  03/05/15 2213  AST 38  ALT 22  ALKPHOS 100  BILITOT 0.9  PROT 6.9  ALBUMIN 3.5    Recent Labs  03/05/15 2213  LIPASE 19*   CBC:  Recent Labs  03/05/15 2213 03/07/15 1008  WBC 5.2 5.1  NEUTROABS 2.9  --   HGB 10.1* 9.5*  HCT 32.1* 29.7*  MCV 85.2 84.3  PLT 119* 114*   Cardiac Enzymes:  Recent Labs  03/05/15 2213 03/05/15 2217  CKTOTAL  --  56  TROPONINI <0.03  --    BNP: Invalid input(s): POCBNP D-Dimer: No results for input(s): DDIMER in the last 72 hours. Hemoglobin A1C: No results for input(s): HGBA1C in the last 72 hours. Fasting Lipid Panel: No results for input(s): CHOL, HDL, LDLCALC, TRIG, CHOLHDL, LDLDIRECT in the last 72 hours. Thyroid Function Tests: No results for input(s): TSH, T4TOTAL, T3FREE, THYROIDAB in the last 72 hours.  Invalid input(s): FREET3 Anemia Panel: No results for input(s): VITAMINB12, FOLATE, FERRITIN, TIBC, IRON, RETICCTPCT in the last 72 hours.   PHYSICAL EXAM General: Well developed, well nourished, in no acute distress HEENT:  Normocephalic and atramatic Neck:  No JVD.  Lungs: Clear bilaterally to auscultation and percussion. Heart: HRRR . Normal S1 and S2 without gallops or murmurs.  Abdomen: Bowel  sounds are positive, abdomen soft and non-tender  Msk:  Back normal, normal gait. Normal strength and tone for age. Extremities: No clubbing, cyanosis or edema.   Neuro: Alert and oriented X 3. Psych:  Good affect, responds appropriately  TELEMETRY: NSR  ASSESSMENT AND PLAN: Hoarsness not and speech disturbance probably due to vomiting and not CVA. BP much better today and echo had normal LVEF.  Principal Problem:   Nausea & vomiting Active Problems:   End-stage renal disease on hemodialysis   Gastroparesis   Abdominal pain   DM (diabetes mellitus)   HTN (hypertension)   Coronary artery disease involving native coronary artery of native heart without angina pectoris    [name] A, MD, [facility] 03/08/2015 10:40 AM

## 2015-03-08 NOTE — Progress Notes (Signed)
Missed measuring device

## 2015-03-08 NOTE — Consult Note (Signed)
[facility] Face-to-Face Psychiatry Consult   Reason for Consult:  Consult on this 54 year old woman with end-stage renal disease on dialysis, diabetes, multiple other medical problems. Consult because "fast talking" Referring Physician:  Mody Patient Identification: [name] MRN:  [id number] Principal Diagnosis: Nausea & vomiting Diagnosis:   Patient Active Problem List   Diagnosis Date Noted  . Somatic symptom disorder, mild [F45.8] 03/08/2015  . Coronary artery disease involving native coronary artery of native heart without angina pectoris [I25.10]   . Nausea & vomiting [R11.2] 03/06/2015  . Abdominal pain [R10.9] 03/06/2015  . DM (diabetes mellitus) [E11.9] 03/06/2015  . HTN (hypertension) [I10] 03/06/2015  . Gastroparesis [K31.84] 02/24/2015  . Pleural effusion [J90] 02/19/2015  . HCAP (healthcare-associated pneumonia) [J18.9] 02/19/2015  . End-stage renal disease on hemodialysis [N18.6, Z99.2] 02/19/2015    Total Time spent with patient: 1 hour  Subjective:   ROSIO [name] is a 54 y.o. female patient admitted with "my son and I noticed that I was talking so fast my sentences were getting ahead of me".  HPI:  Information from the patient and the chart. The chief complaint appears to be a feeling that she is speaking more quickly than usual and that in doing so she runs her words together. He says that her son first noticed this around the time she came into the hospital and then she became aware of it. She denies that she is been having any changes in her mood. She has some chronic anxiety about her medical problems but denies being depressed. She denies having any active hallucinations. She describes something that she calls "hallucinations" that really sounds like some confusion during sleep or sleepwalking. No daytime hallucinations. No clear mood symptoms. Sleep the same as usual. Patient speculates that this symptom may be related to medicine that she is taking. Total unclear about  which medicine that he is but it sounds like she was prescribed bupropion as a smoking aid and that that is one of the candidates for possibly causing this. Also possibly related to narcotic pain medicine.  Past psychiatric history: Patient reports she had 1 suicide attempt many years ago probably 20 years ago. Also reports she has psychiatric hospitalization in the distant past for reasons that she can't clearly describe. More recently she has had treatment for anxiety and depression. Primary care doctor is prescribed medicine for her. Not seeing a therapist or psychiatrist.  Substance abuse history: Smokes regularly. Denies any other alcohol or drug use  Medical history: Multiple medical problems. Dialysis. Gastroparesis diabetes hypertension  Social history: Patient's daughter and 2 granddaughters live with her. Has a son and other extended family she stays in close contact with. HPI Elements:   Quality:  A sensation of talking too fast. Severity:  Mild to moderate. Timing:  Just over the last couple days. Duration:  Transient. Context:  Possibly some changes in medication.  Past Medical History:  Past Medical History  Diagnosis Date  . Asthma   . Collagen vascular disease   . Diabetes mellitus without complication   . Hypertension   . Coronary artery disease     a. cath 2013: stenting to RCA (report not available); b. cath 2014: LM nl, pLAD 40%, mLAD nl, ost LCx 40%, mid LCx nl, pRCA 30% @ site of prior stent, mRCA 50%  . Diabetic neuropathy   . ESRD (end stage renal disease) on dialysis     M-W-F  . GERD (gastroesophageal reflux disease)   . Hx of pancreatitis  2015  . Myocardial infarction   . Pneumonia   . Mitral regurgitation     a. echo 10/2013: EF 62%, noWMA, mildly dilated LA, mild to mod MR/TR, GR1DD  . COPD (chronic obstructive pulmonary disease)     Past Surgical History  Procedure Laterality Date  . Cholecystectomy    . Appendectomy    . Abdominal hysterectomy     . Dialysis fistula creation Left     upper arm   Family History:  Family History  Problem Relation Age of Onset  . Kidney disease Mother   . Diabetes Mother    Social History:  History  Alcohol Use No     History  Drug Use No    History   Social History  . Marital Status: Divorced    Spouse Name: N/A  . Number of Children: N/A  . Years of Education: N/A   Social History Main Topics  . Smoking status: Former Smoker -- 0.50 packs/day    Types: Cigarettes    Quit date: 02/13/2015  . Smokeless tobacco: Never Used  . Alcohol Use: No  . Drug Use: No  . Sexual Activity: Not on file   Other Topics Concern  . None   Social History Narrative   Additional Social History:                          Allergies:   Allergies  Allergen Reactions  . Ace Inhibitors Swelling  . Codeine Nausea And Vomiting  . Compazine [Prochlorperazine Edisylate] Nausea And Vomiting  . Losartan Other (See Comments)  . Phenergan [Promethazine Hcl] Other (See Comments)    Hallucinations  . Zofran [Ondansetron Hcl] Other (See Comments)    Dementia sx  . Oxycodone Anxiety  . Tape Rash    Labs:  Results for orders placed or performed during the hospital encounter of 03/05/15 (from the past 48 hour(s))  Glucose, capillary     Status: Abnormal   Collection Time: 03/06/15  9:23 PM  Result Value Ref Range   Glucose-Capillary 64 (L) 65 - 99 mg/dL   Comment 1 Notify RN   Glucose, capillary     Status: None   Collection Time: 03/07/15  1:11 AM  Result Value Ref Range   Glucose-Capillary 84 65 - 99 mg/dL  Glucose, capillary     Status: None   Collection Time: 03/07/15  2:41 AM  Result Value Ref Range   Glucose-Capillary 75 65 - 99 mg/dL  Glucose, capillary     Status: Abnormal   Collection Time: 03/07/15  7:51 AM  Result Value Ref Range   Glucose-Capillary 56 (L) 65 - 99 mg/dL   Comment 1 Notify RN   Basic metabolic panel     Status: Abnormal   Collection Time: 03/07/15 10:08 AM   Result Value Ref Range   Sodium 137 135 - 145 mmol/L   Potassium 3.5 3.5 - 5.1 mmol/L   Chloride 100 (L) 101 - 111 mmol/L   CO2 30 22 - 32 mmol/L   Glucose, Bld 87 65 - 99 mg/dL   BUN 15 6 - 20 mg/dL   Creatinine, Ser 4.30 (H) 0.44 - 1.00 mg/dL   Calcium 8.3 (L) 8.9 - 10.3 mg/dL   GFR calc non Af Amer 11 (L) >60 mL/min   GFR calc Af Amer 12 (L) >60 mL/min    Comment: (NOTE) The eGFR has been calculated using the CKD EPI equation. This calculation has not  been validated in all clinical situations. eGFR's persistently <60 mL/min signify possible Chronic Kidney Disease.    Anion gap 7 5 - 15  CBC     Status: Abnormal   Collection Time: 03/07/15 10:08 AM  Result Value Ref Range   WBC 5.1 3.6 - 11.0 K/uL   RBC 3.53 (L) 3.80 - 5.20 MIL/uL   Hemoglobin 9.5 (L) 12.0 - 16.0 g/dL   HCT 29.7 (L) 35.0 - 47.0 %   MCV 84.3 80.0 - 100.0 fL   MCH 26.9 26.0 - 34.0 pg   MCHC 31.9 (L) 32.0 - 36.0 g/dL   RDW 21.3 (H) 11.5 - 14.5 %   Platelets 114 (L) 150 - 440 K/uL  Glucose, capillary     Status: None   Collection Time: 03/07/15  3:16 PM  Result Value Ref Range   Glucose-Capillary 83 65 - 99 mg/dL   Comment 1 Notify RN   Glucose, capillary     Status: Abnormal   Collection Time: 03/07/15  9:08 PM  Result Value Ref Range   Glucose-Capillary 149 (H) 65 - 99 mg/dL   Comment 1 Notify RN   Glucose, capillary     Status: None   Collection Time: 03/08/15  7:49 AM  Result Value Ref Range   Glucose-Capillary 91 65 - 99 mg/dL   Comment 1 Notify RN   Glucose, capillary     Status: None   Collection Time: 03/08/15 11:58 AM  Result Value Ref Range   Glucose-Capillary 90 65 - 99 mg/dL   Comment 1 Notify RN   Glucose, capillary     Status: None   Collection Time: 03/08/15  4:41 PM  Result Value Ref Range   Glucose-Capillary 85 65 - 99 mg/dL   Comment 1 Notify RN     Vitals: Blood pressure 102/47, pulse 55, temperature 97.9 F (36.6 C), temperature source Oral, resp. rate 17, height 5' 5"   (1.651 m), weight 81.874 kg (180 lb 8 oz), SpO2 100 %.  Risk to Self: Is patient at risk for suicide?: No Risk to Others:   Prior Inpatient Therapy:   Prior Outpatient Therapy:    Current Facility-Administered Medications  Medication Dose Route Frequency Provider Last Rate Last Dose  . 0.9 %  sodium chloride infusion  250 mL Intravenous PRN Juluis Mire, MD 10 mL/hr at 03/08/15 1310 250 mL at 03/08/15 1310  . acetaminophen (TYLENOL) tablet 650 mg  650 mg Oral Q6H PRN Juluis Mire, MD   650 mg at 03/07/15 1128   Or  . acetaminophen (TYLENOL) suppository 650 mg  650 mg Rectal Q6H PRN Juluis Mire, MD      . albuterol (PROVENTIL) (2.5 MG/3ML) 0.083% nebulizer solution 2.5 mg  2.5 mg Nebulization Q2H PRN Juluis Mire, MD   2.5 mg at 03/06/15 1643  . amLODipine (NORVASC) tablet 10 mg  10 mg Oral Daily Juluis Mire, MD   10 mg at 03/08/15 1012  . atorvastatin (LIPITOR) tablet 40 mg  40 mg Oral Daily Juluis Mire, MD   40 mg at 03/08/15 1011  . carvedilol (COREG) tablet 25 mg  25 mg Oral BID Juluis Mire, MD   25 mg at 03/08/15 1011  . cholecalciferol (VITAMIN D) tablet 800 Units  800 Units Oral Daily Juluis Mire, MD   800 Units at 03/08/15 1012  . citalopram (CELEXA) tablet 20 mg  20 mg Oral Daily Juluis Mire, MD   20 mg at  03/08/15 1013  . furosemide (LASIX) injection 20 mg  20 mg Intravenous Once Juluis Mire, MD   20 mg at 03/06/15 0815  . heparin injection 3,000 Units  3,000 Units Dialysis Once in dialysis Murlean Iba, MD   3,000 Units at 03/06/15 1045  . heparin injection 5,000 Units  5,000 Units Subcutaneous 3 times per day Juluis Mire, MD   5,000 Units at 03/08/15 0716  . hydrALAZINE (APRESOLINE) tablet 100 mg  100 mg Oral 4 times per day Bettey Costa, MD   100 mg at 03/08/15 1703  . HYDROcodone-acetaminophen (NORCO/VICODIN) 5-325 MG per tablet 1 tablet  1 tablet Oral Q6H PRN Juluis Mire, MD   1 tablet at 03/08/15 1702  . insulin aspart  (novoLOG) injection 0-15 Units  0-15 Units Subcutaneous TID WC Juluis Mire, MD   0 Units at 03/06/15 1200  . labetalol (NORMODYNE) tablet 100 mg  100 mg Oral BID Bettey Costa, MD      . labetalol (NORMODYNE,TRANDATE) injection 20 mg  20 mg Intravenous QID PRN Bettey Costa, MD      . lipase/protease/amylase (CREON) capsule 24,000 Units  24,000 Units Oral With snacks Bettey Costa, MD   24,000 Units at 03/08/15 1703  . lipase/protease/amylase (CREON) capsule 48,000 Units  48,000 Units Oral TID WC Bettey Costa, MD   48,000 Units at 03/07/15 1541  . loratadine (CLARITIN) tablet 10 mg  10 mg Oral QODAY Bettey Costa, MD   10 mg at 03/08/15 1012  . metoCLOPramide (REGLAN) tablet 5 mg  5 mg Oral QHS Sital Mody, MD      . morphine 2 MG/ML injection 2 mg  2 mg Intravenous Q4H PRN Juluis Mire, MD   2 mg at 03/07/15 0700  . nitroGLYCERIN (NITROSTAT) SL tablet 0.4 mg  0.4 mg Sublingual Q5 min PRN Juluis Mire, MD      . pantoprazole (PROTONIX) EC tablet 40 mg  40 mg Oral BID AC Juluis Mire, MD   40 mg at 03/08/15 1703  . ranolazine (RANEXA) 12 hr tablet 1,000 mg  1,000 mg Oral BID Juluis Mire, MD   1,000 mg at 03/08/15 1013  . sevelamer carbonate (RENVELA) tablet 800 mg  800 mg Oral TID WC Juluis Mire, MD   800 mg at 03/08/15 1703  . sodium chloride 0.9 % injection 3 mL  3 mL Intravenous Q12H Juluis Mire, MD   3 mL at 03/08/15 1013  . sodium chloride 0.9 % injection 3 mL  3 mL Intravenous PRN Juluis Mire, MD   3 mL at 03/06/15 1837  . sucralfate (CARAFATE) tablet 1 g  1 g Oral TID WC & HS Juluis Mire, MD   1 g at 03/08/15 1703    Musculoskeletal: Strength & Muscle Tone: within normal limits Gait & Station: normal Patient leans: N/A  Psychiatric Specialty Exam: Physical Exam  Constitutional: She appears well-developed and well-nourished.  HENT:  Head: Normocephalic and atraumatic.  Eyes: Conjunctivae are normal. Pupils are equal, round, and reactive to light.  Neck:  Normal range of motion.  Cardiovascular: Normal heart sounds.   Respiratory: Effort normal.  GI: Soft.  Musculoskeletal: Normal range of motion.  Neurological: She is alert.  Skin: Skin is warm and dry.  Psychiatric: She has a normal mood and affect. Her speech is normal and behavior is normal. Judgment and thought content normal. She exhibits abnormal recent memory.  This patient's psychiatric mental status exam appears  to be quite unremarkable to me except that she can only remember one word at 3 minutes    Review of Systems  Constitutional: Negative.   HENT: Negative.   Eyes: Negative.   Respiratory: Negative.   Cardiovascular: Negative.   Gastrointestinal: Positive for nausea and vomiting.  Musculoskeletal: Negative.   Skin: Negative.   Neurological: Negative.   Psychiatric/Behavioral: Negative for depression, suicidal ideas, hallucinations, memory loss and substance abuse. The patient is not nervous/anxious and does not have insomnia.     Blood pressure 102/47, pulse 55, temperature 97.9 F (36.6 C), temperature source Oral, resp. rate 17, height 5' 5"  (1.651 m), weight 81.874 kg (180 lb 8 oz), SpO2 100 %.Body mass index is 30.04 kg/(m^2).  General Appearance: Casual  Eye Contact::  Good  Speech:  Clear and Coherent and Normal Rate  Volume:  Normal  Mood:  Euthymic  Affect:  Appropriate  Thought Process:  Circumstantial  Orientation:  Full (Time, Place, and Person)  Thought Content:  Negative  Suicidal Thoughts:  No  Homicidal Thoughts:  No  Memory:  Immediate;   Good Recent;   Good Remote;   Good  Judgement:  Fair  Insight:  Good  Psychomotor Activity:  Normal  Concentration:  Good  Recall:  Good  Fund of Knowledge:Good  Language: Good  Akathisia:  No  Handed:  Right  AIMS (if indicated):     Assets:  Communication Skills Desire for Improvement Housing Intimacy Leisure Time Social Support  ADL's:  Intact  Cognition: WNL  Sleep:      Medical Decision  Making: Review of Psycho-Social Stressors (1), Review or order clinical lab tests (1), New Problem, with no additional work-up planned (3) and Review of Medication Regimen & Side Effects (2)  Treatment Plan Summary: Plan After spending quite some time trying to really clarify the problem I don't think that I can say that she has a mood problem or even a clearcut anxiety problem. She is not psychotic. The complaint about her speech seems to be exaggerated beyond the real problem. She claimed to me that she was still having this abnormal speech but I found her speech to be completely normal and saw nothing unusual about it. I get the feeling that this patient may be a little somatically overattentive. She has a lot of opinions about various side effects and details related to multiple medicines that she is taking. On the other hand it is also possible that narcotics may have caused some transient mental status changes that she could've noticed in her speech. Wellbutrin also could've been stimulating or cause some changes in mental status. Be any indication for a specific psychiatric intervention and certainly not adding any medicine. I would probably recommend that she continue to work with her doctors on the plan she has to try and minimize unnecessary medicines. Anything that could have any psychoactive effect that she doesn't want to take or is not necessary should probably be discontinued. Otherwise I don't see any reason to intervene. I'm going to consider this is somatization type condition. Supportive and reassuring counseling done. No other treatment plan. Oh need for outpatient therapy or psychiatric intervention  Plan:  Supportive therapy provided about ongoing stressors. Disposition: No change to medicine. She is Artie off the bupropion. Minimize narcotics especially the Dilaudid. Minimize Zofran as she thinks that might of been part of the problem. Generally try and avoid adding unnecessary  medicines. I will follow-up as needed  Alethia Berthold 03/08/2015 5:26 PM

## 2015-03-08 NOTE — Progress Notes (Signed)
Pt has been acting unusual this shift- laughing at inappropriate times, questioning her meds stating that the doctors stopped them all yesterday when they haven't, she can be rude at times when she is approached, and staring into space.  However she is alert and oriented x3.

## 2015-03-08 NOTE — Progress Notes (Addendum)
[facility] [place] NAME: [name]    MR#:  [id number]  DATE OF BIRTH:  07/18/1961  SUBJECTIVE:  Patient is going for EGD this morning. She reports that she feels hoarse. She also reports that her speech is fast. She denies any other manic behavior. Her nausea has improved. She denies any abdominal pain. She also mentions that she feels that the medications have impaired her memory. REVIEW OF SYSTEMS:    Review of Systems  Constitutional: Negative for fever, chills and malaise/fatigue.  HENT: Positive for sore throat (Patient is complaining of hoarseness).   Eyes: Negative for blurred vision.  Respiratory: Negative for cough, hemoptysis, shortness of breath and wheezing.   Cardiovascular: Negative for chest pain, palpitations and leg swelling.  Gastrointestinal: Negative for nausea (improved), vomiting, abdominal pain, diarrhea, constipation and blood in stool.  Genitourinary: Negative for dysuria.  Musculoskeletal: Negative for back pain.  Neurological: Negative for dizziness, tremors and headaches.  Endo/Heme/Allergies: Does not bruise/bleed easily.  Psychiatric/Behavioral: Positive for depression and memory loss.    Tolerating Diet: Yes clear liquids    DRUG ALLERGIES:   Allergies  Allergen Reactions  . Ace Inhibitors Swelling  . Codeine Nausea And Vomiting  . Compazine [Prochlorperazine Edisylate] Nausea And Vomiting  . Losartan Other (See Comments)  . Phenergan [Promethazine Hcl] Other (See Comments)    Hallucinations  . Zofran [Ondansetron Hcl] Other (See Comments)    Dementia sx  . Oxycodone Anxiety  . Tape Rash    VITALS:  Blood pressure 139/57, pulse 64, temperature 98.2 F (36.8 C), temperature source Oral, resp. rate 17, height 5\' 5"  (1.651 m), weight 81.874 kg (180 lb 8 oz), SpO2 100 %.  PHYSICAL EXAMINATION:   Physical Exam  Constitutional: She is oriented to person, place, and time and  well-developed, well-nourished, and in no distress. No distress.  HENT:  Head: Normocephalic.  Eyes: No scleral icterus.  Neck: Normal range of motion. Neck supple. No JVD present. No tracheal deviation present.  Cardiovascular: Normal rate, regular rhythm and normal heart sounds.  Exam reveals no gallop and no friction rub.   No murmur heard. Pulmonary/Chest: Effort normal and breath sounds normal. No respiratory distress. She has no wheezes. She has no rales. She exhibits no tenderness.  Abdominal: Soft. Bowel sounds are normal. She exhibits no distension and no mass. There is no tenderness. There is no rebound and no guarding.  Musculoskeletal: Normal range of motion. She exhibits no edema.  Neurological: She is alert and oriented to person, place, and time.  Skin: Skin is warm. No rash noted. No erythema.  Psychiatric: Affect and judgment normal.      LABORATORY PANEL:   CBC  Recent Labs Lab 03/07/15 1008  WBC 5.1  HGB 9.5*  HCT 29.7*  PLT 114*   ------------------------------------------------------------------------------------------------------------------  Chemistries   Recent Labs Lab 03/05/15 2213 03/07/15 1008  NA 140 137  K 3.9 3.5  CL 97* 100*  CO2 28 30  GLUCOSE 107* 87  BUN 25* 15  CREATININE 6.01* 4.30*  CALCIUM 8.8* 8.3*  AST 38  --   ALT 22  --   ALKPHOS 100  --   BILITOT 0.9  --    ------------------------------------------------------------------------------------------------------------------  Cardiac Enzymes  Recent Labs Lab 03/02/15 0922 03/05/15 2213  TROPONINI <0.03 <0.03   ------------------------------------------------------------------------------------------------------------------  RADIOLOGY:  Dg Chest 2 View  03/06/2015   C.  IMPRESSION: Cardiac enlargement with pulmonary vascular congestion and mild perihilar  edema. Bilateral pleural effusions and basilar atelectasis.   Electronically Signed   By: Lucienne Capers M.D.    On: 03/06/2015 04:30   Dg Abd 1 View  03/06/2015   IMPRESSION: Negative.   Electronically Signed   By: Lucienne Capers M.D.   On: 03/06/2015 04:30     ASSESSMENT AND PLAN:   54 year old female with end-stage renal disease on hemodialysis and diabetes who presents with nausea and vomiting.  1. Nausea and vomiting: Nausea and vomiting have resolved.  Plan is for the patient to undergo EGD this afternoon. Patient has had cardiac clearance with Dr. Humphrey Rolls.  Due to side effects over and has been discontinued. Dr. Candiss Norse also recommends to decrease the Reglan to 5 mg at bedtime due to side effects with slurred speech and memory loss.  GI and cardiology consultation is appreciated. Patient should continue PPI and Carafate.  2. End-stage renal disease: Patient's currently receiving dialysis on Monday, Wednesday and Friday. Appreciate nephrology  consultation. Continue Renvela. 3. Diabetes. We will continue sliding scale insulin. I will continue to monitor her blood sugars.   4. Coronary artery disease status post MI: Patient will continue Coreg and atorvastatin. aspirin and Plavix on hold for now for EGD. If the EGD is not concerning for active bleeding she may resume these medications.  5. GERD: Patient will continue PPI.  6. Accelerated hypertension: Her blood pressure still fluctuates. She is currently on labetalol 100 mg by mouth twice a day as well as Coreg and I increased hydralazine. I will continue to monitor and watch HR.  7. Depression: Patient no longer takes Wellbutrin. I have discontinued this. She will continue Celexa. I have also placed a psychiatry consult given her fast speech and depression.   Management plans discussed with the patient and she is in agreement.  I have spoken with Dr. Candiss Norse   CODE STATUS: FULL  TOTAL TIME TAKING CARE OF THIS PATIENT: 25 minutes.   Greater than 50% counseling and coordination of care  POSSIBLE D/C tomorrow , DEPENDING ON CLINICAL  CONDITION.   Cloie Wooden M.D on 03/08/2015 at 11:30 AM  Between 7am to 6pm - Pager - [phone] After 6pm go to [email or web address] - password [email or web address]  Office  [phone]  CC: Primary care physician; Ellamae Sia, MD

## 2015-03-08 NOTE — Progress Notes (Signed)
Subjective:  Patient reports pressure of speech and slurring of speech Family in room confirms that she is speaking in a different manner compared to her usual. No SOB No leg edema  Objective:  Vital signs in last 24 hours:  Temp:  [97.5 F (36.4 C)-98.9 F (37.2 C)] 98.2 F (36.8 C) (06/16 0750) Pulse Rate:  [61-76] 64 (06/16 0750) Resp:  [16-24] 17 (06/16 0750) BP: (129-202)/(56-182) 139/57 mmHg (06/16 0750) SpO2:  [91 %-100 %] 100 % (06/16 0750) Weight:  [81.1 kg (178 lb 12.7 oz)-81.874 kg (180 lb 8 oz)] 81.874 kg (180 lb 8 oz) (06/16 0404)  Weight change: -5.25 kg (-11 lb 9.2 oz) Filed Weights   03/07/15 0936 03/07/15 1352 03/08/15 0404  Weight: 79.8 kg (175 lb 14.8 oz) 81.1 kg (178 lb 12.7 oz) 81.874 kg (180 lb 8 oz)    Intake/Output: I/O last 3 completed shifts: In: 720 [P.O.:720] Out: 1000 [Other:1000]   Intake/Output this shift:     Physical Exam: General: NAD  Head: Normocephalic, atraumatic. Moist oral mucosal membranes  Eyes: Anicteric  Neck: Supple, trachea midline  Lungs:  Diminished BS at bases, normal effort  Heart: Regular rate and rhythm S1S2  Abdomen:  Soft, nontender,   Extremities:  no peripheral edema.  Neurologic: Awake, alert, follows commands  Skin: No rashes  Access: LUE AV access    Basic Metabolic Panel:  Recent Labs Lab 03/02/15 0922 03/05/15 2213 03/07/15 1008  NA 139 140 137  K 3.1* 3.9 3.5  CL 98* 97* 100*  CO2 30 28 30   GLUCOSE 77 107* 87  BUN 18 25* 15  CREATININE 4.60* 6.01* 4.30*  CALCIUM 8.5* 8.8* 8.3*    Liver Function Tests:  Recent Labs Lab 03/02/15 0922 03/05/15 2213  AST 22 38  ALT 15 22  ALKPHOS 99 100  BILITOT 0.8 0.9  PROT 6.9 6.9  ALBUMIN 3.6 3.5    Recent Labs Lab 03/02/15 0922 03/05/15 2213  LIPASE 21* 19*   No results for input(s): AMMONIA in the last 168 hours.  CBC:  Recent Labs Lab 03/05/15 2213 03/07/15 1008  WBC 5.2 5.1  NEUTROABS 2.9  --   HGB 10.1* 9.5*  HCT 32.1*  29.7*  MCV 85.2 84.3  PLT 119* 114*    Cardiac Enzymes:  Recent Labs Lab 03/02/15 0922 03/05/15 2213 03/05/15 2217  CKTOTAL  --   --  79  TROPONINI <0.03 <0.03  --     BNP: Invalid input(s): POCBNP  CBG:  Recent Labs Lab 03/07/15 0241 03/07/15 0751 03/07/15 1516 03/07/15 2108 03/08/15 0749  GLUCAP 75 56* 83 149* 91    Microbiology: Results for orders placed or performed during the hospital encounter of 02/19/15  Blood culture (routine x 2)     Status: None   Collection Time: 02/19/15 12:20 PM  Result Value Ref Range Status   Specimen Description BLOOD  Final   Special Requests Immunocompromised  Final   Culture NO GROWTH 5 DAYS  Final   Report Status 02/24/2015 FINAL  Final  Blood culture (routine x 2)     Status: None   Collection Time: 02/19/15 12:30 PM  Result Value Ref Range Status   Specimen Description BLOOD  Final   Special Requests Immunocompromised  Final   Culture NO GROWTH 5 DAYS  Final   Report Status 02/24/2015 FINAL  Final  Body fluid culture     Status: None   Collection Time: 02/20/15  2:55 PM  Result Value Ref  Range Status   Specimen Description PLEURAL  Final   Special Requests NONE  Final   Gram Stain   Final    FEW WBC SEEN RARE RED BLOOD CELLS FEW GRAM VARIABLE ROD    Culture NO GROWTH 4 DAYS  Final   Report Status 02/24/2015 FINAL  Final  Body fluid culture     Status: None   Collection Time: 02/21/15  4:30 PM  Result Value Ref Range Status   Specimen Description PLEURAL  Final   Special Requests NONE  Final   Gram Stain MANY WBC SEEN NO ORGANISMS SEEN   Final   Culture NO GROWTH 3 DAYS  Final   Report Status 02/26/2015 FINAL  Final    Coagulation Studies: No results for input(s): LABPROT, INR in the last 72 hours.  Urinalysis:  Recent Labs  03/06/15 0146  COLORURINE YELLOW*  LABSPEC 1.010  PHURINE 9.0*  GLUCOSEU 50*  HGBUR 1+*  BILIRUBINUR NEGATIVE  KETONESUR NEGATIVE  PROTEINUR 100*  NITRITE NEGATIVE   LEUKOCYTESUR NEGATIVE      Imaging: No results found.   Medications:     . amLODipine  10 mg Oral Daily  . atorvastatin  40 mg Oral Daily  . buPROPion  150 mg Oral BID  . carvedilol  25 mg Oral BID  . cholecalciferol  800 Units Oral Daily  . citalopram  20 mg Oral Daily  . furosemide  20 mg Intravenous Once  . heparin  3,000 Units Dialysis Once in dialysis  . heparin  5,000 Units Subcutaneous 3 times per day  . hydrALAZINE  50 mg Oral 4 times per day  . insulin aspart  0-15 Units Subcutaneous TID WC  . labetalol  200 mg Oral BID  . lipase/protease/amylase  24,000 Units Oral With snacks  . lipase/protease/amylase  48,000 Units Oral TID WC  . loratadine  10 mg Oral QODAY  . metoCLOPramide  5 mg Oral QHS  . pantoprazole  40 mg Oral BID AC  . ranolazine  1,000 mg Oral BID  . sevelamer carbonate  800 mg Oral TID WC  . sodium chloride  3 mL Intravenous Q12H  . sucralfate  1 g Oral TID WC & HS   sodium chloride, acetaminophen **OR** acetaminophen, albuterol, HYDROcodone-acetaminophen, labetalol, morphine injection, nitroGLYCERIN, sodium chloride  Assessment/ Plan:  54 yo AAF with h/o ESRD, DM (30 yrs with retinopathy, neuropathy), OA, IBS, hyperlipidemia, h/o GI bleed, SHPTH, AOCD, diastolic heart failure , RCA stent Feb 2013, C. diff colitis 7/14, clotted RUE AVF 10/14, admission for shortness of breath 02/20/15. Abdominal pain  N/V June 14  1.  ESRD on HD MWF:    - Next HD scheduled for tomorrow 2.  Nausea/Vomiting- patient has h/o severe Gastroparesis.   -  GI eval ongoing -  EGD planned 3.  Anemia of CKD: hgb 9.5. Will start EPO next treatment 4.  SHPTH of renal origin:  Monitor phos level   LOS: 2 [name] 6/16/201610:27 AM

## 2015-03-08 NOTE — Anesthesia Postprocedure Evaluation (Signed)
  Anesthesia Post-op Note  Patient: [name]  Procedure(s) Performed: Procedure(s): ESOPHAGOGASTRODUODENOSCOPY (EGD) (N/A)  Anesthesia type:General  Patient location: PACU  Post pain: Pain level controlled  Post assessment: Post-op Vital signs reviewed, Patient's Cardiovascular Status Stable, Respiratory Function Stable, Patent Airway and No signs of Nausea or vomiting  Post vital signs: Reviewed and stable  Last Vitals:  Filed Vitals:   03/08/15 1430  BP: 120/56  Pulse: 57  Temp:   Resp: 16    Level of consciousness: awake, alert  and patient cooperative  Complications: No apparent anesthesia complications

## 2015-03-09 LAB — GLUCOSE, CAPILLARY
Glucose-Capillary: 88 mg/dL (ref 65–99)
Glucose-Capillary: 70 mg/dL (ref 65–99)
Glucose-Capillary: 134 mg/dL — ABNORMAL HIGH (ref 65–99)

## 2015-03-09 MED ORDER — LABETALOL HCL 100 MG PO TABS: 300.0000 mg | ORAL_TABLET | Freq: Two times a day (BID) | ORAL | Status: DC

## 2015-03-09 MED ORDER — HYDRALAZINE HCL 100 MG PO TABS: 100.0000 mg | ORAL_TABLET | Freq: Three times a day (TID) | ORAL | Status: DC

## 2015-03-09 MED ORDER — LABETALOL HCL 300 MG PO TABS: 300.0000 mg | ORAL_TABLET | Freq: Two times a day (BID) | ORAL | Status: DC

## 2015-03-09 MED ORDER — PROMETHAZINE HCL 25 MG/ML IJ SOLN
12.5000 mg | Freq: Once | INTRAMUSCULAR | Status: AC
  Administered 2015-03-09: 12.5 mg via INTRAVENOUS

## 2015-03-09 MED ORDER — LABETALOL HCL 200 MG PO TABS: 200.0000 mg | ORAL_TABLET | Freq: Two times a day (BID) | ORAL | Status: DC

## 2015-03-09 MED ORDER — LIDOCAINE HCL (PF) 1 % IJ SOLN
2.0000 mL | Freq: Once | INTRAMUSCULAR | Status: AC
  Administered 2015-03-09: 1 mL
  Filled 2015-03-09: qty 2

## 2015-03-09 MED ORDER — EPOETIN ALFA 10000 UNIT/ML IJ SOLN
4000.0000 [IU] | Freq: Once | INTRAMUSCULAR | Status: AC
Start: 2015-03-09 — End: 2015-03-09
  Administered 2015-03-09: 4000 [IU] via SUBCUTANEOUS

## 2015-03-09 MED ORDER — HYDRALAZINE HCL 50 MG PO TABS
100.0000 mg | ORAL_TABLET | Freq: Three times a day (TID) | ORAL | Status: DC
  Filled 2015-03-09: qty 2

## 2015-03-09 NOTE — Progress Notes (Addendum)
Nutrition Follow-up      INTERVENTION:   (Meals and snacks: ) Cater to pt prefences Nutrition Supplement Therapy: Pt does not like nepro.  If unable to meet needs on solid foods will re-assess need for supplement  NUTRITION DIAGNOSIS:  Inadequate oral intake related to altered GI function as evidenced by meal completion < 50%.    GOAL:  Patient will meet greater than or equal to 90% of their needs    MONITOR:   (Energy intake, Electrolyte and renal profile, Glucose profile, Digestive system)  REASON FOR ASSESSMENT:  Malnutrition Screening Tool    ASSESSMENT:  Pt s/p EGD with normal esophagus and duodenum, gastritis noted  Current Nutrition: Reports ate some of chicken salad, 3/4 of carrots and jello and few bites mashed potatoes last night for dinner.  Breakfast just arrived during visit   Labs:  Electrolyte and Renal Profile:  Recent Labs Lab 03/02/15 0922 03/05/15 2213 03/07/15 1008  BUN 18 25* 15  CREATININE 4.60* 6.01* 4.30*  NA 139 140 137  K 3.1* 3.9 3.5     Medications: reviewed   Digestive system: 6/15 BM noted, reports some nausea after dinner last night   Height:  Ht Readings from Last 1 Encounters:  03/06/15 5\' 5"  (1.651 m)    Weight:  Wt Readings from Last 1 Encounters:  03/09/15 179 lb 11.2 oz (81.511 kg)    BMI:  Body mass index is 29.9 kg/(m^2).  Estimated Nutritional Needs:  Kcal:  Using IBW of 57kg BEE 1165 kcals (IF 1.2-1.4, AF 1.3) [id number] kcals/d  Protein:  68-86 g/d (1.2-1.5 g/kg) Using IBW of 57kg  Fluid:  1040ml + UOP     Diet Order:  Diet renal/carb modified with fluid restriction Diet-HS Snack?: Nothing; Room service appropriate?: Yes; Fluid consistency:: Thin  EDUCATION NEEDS:  No education needs identified at this time   Intake/Output Summary (Last 24 hours) at 03/09/15 0815 Last data filed at 03/09/15 0734  Gross per 24 hour  Intake    303 ml  Output    100 ml  Net    203 ml     [facility]  Level Tevyn Codd B. Zenia Resides, [place], [place] (pager)

## 2015-03-09 NOTE — Consult Note (Signed)
Pt feeling better knowing nothing serious in her stomach.  Antral gastritis of unknown cause.  Started on carafate recently, need to continue this for at least 8 weeks.  Eating some this morning. No new complaints.  GI will not see her unless you call about a problem.

## 2015-03-09 NOTE — Care Management Note (Signed)
Patient is active as a out patient at [facility] on MWF.  Has been a patient for 10 years. [name]  [phone]

## 2015-03-09 NOTE — Progress Notes (Signed)
Hold hydralazine per Dr. Verdell Carmine for low BP

## 2015-03-09 NOTE — Care Management (Signed)
EGD showed gastritis.  Would anticipate discharge within the next 24 hours

## 2015-03-09 NOTE — Progress Notes (Signed)
Pt A and O x 4. VSS. Pt tolerating diet well. Pt went down to dialysis this morning. Complaints of headache with meds given to control. Pt had IV removed and prescriptions given. Pt voiced that she understood discharge instructions with no other questions. Pts son asked to speak with doctor prior to discharge. Pt left via wheelchair with auxillary.

## 2015-03-09 NOTE — Discharge Summary (Signed)
[facility] NAME: [name]    MR#:  [id number]  DATE OF BIRTH:  Aug 01, 1961  DATE OF ADMISSION:  03/05/2015 ADMITTING PHYSICIAN: Juluis Mire, MD  DATE OF DISCHARGE: 03/09/2015  PRIMARY CARE PHYSICIAN: Ellamae Sia, MD    ADMISSION DIAGNOSIS:  Intractable vomiting with nausea, vomiting of unspecified type [R11.10]  DISCHARGE DIAGNOSIS:  Principal Problem:   Nausea & vomiting Active Problems:   End-stage renal disease on hemodialysis   Gastroparesis   Abdominal pain   DM (diabetes mellitus)   HTN (hypertension)   Coronary artery disease involving native coronary artery of native heart without angina pectoris   Somatic symptom disorder, mild   SECONDARY DIAGNOSIS:   Past Medical History  Diagnosis Date  . Asthma   . Collagen vascular disease   . Diabetes mellitus without complication   . Hypertension   . Coronary artery disease     a. cath 2013: stenting to RCA (report not available); b. cath 2014: LM nl, pLAD 40%, mLAD nl, ost LCx 40%, mid LCx nl, pRCA 30% @ site of prior stent, mRCA 50%  . Diabetic neuropathy   . ESRD (end stage renal disease) on dialysis     M-W-F  . GERD (gastroesophageal reflux disease)   . Hx of pancreatitis 2015  . Myocardial infarction   . Pneumonia   . Mitral regurgitation     a. echo 10/2013: EF 62%, noWMA, mildly dilated LA, mild to mod MR/TR, GR1DD  . COPD (chronic obstructive pulmonary disease)     HOSPITAL COURSE:   54 year old female with past medical history of end-stage renal disease on hemodialysis, diabetic gastroparesis, diabetes, hypertension, history of coronary disease, GERD, chronic pancreatitis, COPD who presented to the hospital due to intractable nausea vomiting.  #1 intractable nausea and vomiting-most likely cause of this was probably secondary to diabetic gastroparesis. Patient was treated supportively with IV fluids anti-emetics pain control and  supportive care. -Patient was seen by gastroenterology by Dr. Vira Agar who agreed with this management. Patient did undergo an upper GI endoscopy on 03/08/2015 showing no evidence of peptic ulcer disease but just some mild gastritis. -She will continue Protonix twice a day, and Carafate. Patient clinically has improved and is tolerating by mouth well without any further exacerbation of her nausea vomiting.  #2 end-stage renal disease on hemodialysis-patient was seen by nephrology and maintained on her dialysis schedule Monday Wednesday Friday and she will resume that.  #3 malignant/uncontrolled hypertension-patient's blood pressure after treatment with some IV labetalol and also maintaining on her antihypertensives has improved. -Patient presently is being discharged on a regimen stated below along with addition of hydralazine 3 times a day.  #4 history of coronary disease-patient had no acute chest pain. -We will continue aspirin, Coreg, statin, Ranexa -Patient will continue follow-up with her cardiologist Dr. Neoma Laming  #5 type 2 diabetes - patient will continue her Levemir, NovoLog with meals. Blood sugars remained stable on the hospital.  #6 diabetic neuropathy-patient was maintained on Neurontin she will resume that.  #7 anxiety-patient was seen by psychiatry and he did not recommend any new medications, patient will continue her BuSpar.  DISCHARGE CONDITIONS:   Stable  CONSULTS OBTAINED:  Treatment Team:  Murlean Iba, MD Manya Silvas, MD Gonzella Lex, MD Dionisio David, MD  DRUG ALLERGIES:   Allergies  Allergen Reactions  . Ace Inhibitors Swelling  . Codeine Nausea And Vomiting  . Compazine [Prochlorperazine Edisylate]  Nausea And Vomiting  . Losartan Other (See Comments)  . Phenergan [Promethazine Hcl] Other (See Comments)    Hallucinations  . Zofran [Ondansetron Hcl] Other (See Comments)    Dementia sx  . Oxycodone Anxiety  . Tape Rash    DISCHARGE  MEDICATIONS:   Current Discharge Medication List    START taking these medications   Details  hydrALAZINE (APRESOLINE) 100 MG tablet Take 1 tablet (100 mg total) by mouth 3 (three) times daily. Qty: 90 tablet, Refills: 1      CONTINUE these medications which have NOT CHANGED   Details  amLODipine (NORVASC) 10 MG tablet Take 10 mg by mouth daily.    aspirin EC 81 MG tablet Take 81 mg by mouth daily.    atorvastatin (LIPITOR) 40 MG tablet Take 40 mg by mouth daily.    buPROPion (WELLBUTRIN SR) 150 MG 12 hr tablet Take 150 mg by mouth 2 (two) times daily.    carvedilol (COREG) 25 MG tablet Take 25 mg by mouth 2 (two) times daily.    cetirizine (ZYRTEC) 10 MG tablet Take 10 mg by mouth daily.    citalopram (CELEXA) 20 MG tablet Take 20 mg by mouth daily.    clopidogrel (PLAVIX) 75 MG tablet Take 75 mg by mouth daily.    Ergocalciferol (VITAMIN D2) 400 UNITS TABS Take 2 tablets by mouth daily.     gabapentin (NEURONTIN) 100 MG capsule Take 100 mg by mouth daily.    hydrocortisone 1 % ointment Apply 1 application topically 2 (two) times daily.    insulin aspart (NOVOLOG) 100 UNIT/ML injection Inject 2-6 Units into the skin 3 (three) times daily with meals. Pt uses as needed per sliding scale.    insulin detemir (LEVEMIR) 100 UNIT/ML injection Inject 8 Units into the skin at bedtime.    lipase/protease/amylase (CREON) 12000 UNITS CPEP capsule Take 24,000-48,000 Units by mouth 5 (five) times daily. Pt takes four capsules with meals and two capsules with snacks.    nitroGLYCERIN (NITROSTAT) 0.4 MG SL tablet Place 0.4 mg under the tongue every 5 (five) minutes as needed for chest pain.    Omega-3 Fatty Acids (FISH OIL) 1000 MG CAPS Take 1 capsule by mouth 2 (two) times daily.    ranitidine (ZANTAC) 150 MG tablet Take 150 mg by mouth 2 (two) times daily.    ranolazine (RANEXA) 1000 MG SR tablet Take 1,000 mg by mouth 2 (two) times daily.     sevelamer carbonate (RENVELA) 800 MG  tablet Take 800 mg by mouth 3 (three) times daily with meals.    albuterol (PROVENTIL HFA;VENTOLIN HFA) 108 (90 BASE) MCG/ACT inhaler Inhale 4-6 puffs by mouth every 4 hours as needed for wheezing, cough, and/or shortness of breath Qty: 1 Inhaler, Refills: 1    HYDROcodone-acetaminophen (NORCO/VICODIN) 5-325 MG per tablet Take 1 tablet by mouth every 6 (six) hours as needed for moderate pain. Qty: 20 tablet, Refills: 0    metoCLOPramide (REGLAN) 5 MG tablet Take 1 tablet (5 mg total) by mouth 4 (four) times daily -  before meals and at bedtime. Qty: 40 tablet, Refills: 0    ondansetron (ZOFRAN) 4 MG tablet Take 1 tablet (4 mg total) by mouth daily as needed for nausea or vomiting. Qty: 20 tablet, Refills: 1    pantoprazole (PROTONIX) 40 MG tablet Take 1 tablet (40 mg total) by mouth 2 (two) times daily before a meal. Qty: 60 tablet, Refills: 0    sucralfate (CARAFATE) 1 G tablet Take  1 tablet (1 g total) by mouth 4 (four) times daily -  with meals and at bedtime. Qty: 120 tablet, Refills: 0      STOP taking these medications     levofloxacin (LEVAQUIN) 500 MG tablet          DISCHARGE INSTRUCTIONS:   DIET:  Cardiac diet and Renal diet  DISCHARGE CONDITION:  Stable  ACTIVITY:  Activity as tolerated  OXYGEN:  Home Oxygen: No.   Oxygen Delivery: room air  DISCHARGE LOCATION:  home   If you experience worsening of your admission symptoms, develop shortness of breath, life threatening emergency, suicidal or homicidal thoughts you must seek medical attention immediately by calling 911 or calling your MD immediately  if symptoms less severe.  You Must read complete instructions/literature along with all the possible adverse reactions/side effects for all the Medicines you take and that have been prescribed to you. Take any new Medicines after you have completely understood and accpet all the possible adverse reactions/side effects.   Please note  You were cared for by a  hospitalist during your hospital stay. If you have any questions about your discharge medications or the care you received while you were in the hospital after you are discharged, you can call the unit and asked to speak with the hospitalist on call if the hospitalist that took care of you is not available. Once you are discharged, your primary care physician will handle any further medical issues. Please note that NO REFILLS for any discharge medications will be authorized once you are discharged, as it is imperative that you return to your primary care physician (or establish a relationship with a primary care physician if you do not have one) for your aftercare needs so that they can reassess your need for medications and monitor your lab values.     Today    VITAL SIGNS:  Blood pressure 160/66, pulse 72, temperature 97.8 F (36.6 C), temperature source Oral, resp. rate 18, height 5\' 5"  (1.651 m), weight 80.2 kg (176 lb 12.9 oz), SpO2 99 %.  I/O:   Intake/Output Summary (Last 24 hours) at 03/09/15 1348 Last data filed at 03/09/15 1322  Gross per 24 hour  Intake    543 ml  Output   1000 ml  Net   -457 ml    PHYSICAL EXAMINATION:  GENERAL:  54 y.o.-year-old patient lying in the bed with no acute distress.  EYES: Pupils equal, round, reactive to light and accommodation. No scleral icterus. Extraocular muscles intact.  HEENT: Head atraumatic, normocephalic. Oropharynx and nasopharynx clear.  NECK:  Supple, no jugular venous distention. No thyroid enlargement, no tenderness.  LUNGS: Normal breath sounds bilaterally, no wheezing, rales,rhonchi. No use of accessory muscles of respiration.  CARDIOVASCULAR: S1, S2 normal. No murmurs, rubs, or gallops.  ABDOMEN: Soft, non-tender, non-distended. Bowel sounds present. No organomegaly or mass.  EXTREMITIES: No pedal edema, cyanosis, or clubbing.  NEUROLOGIC: Cranial nerves II through XII are intact. No focal motor or sensory defecits b/l.   PSYCHIATRIC: The patient is alert and oriented x 3. Good affect.  SKIN: No obvious rash, lesion, or ulcer.   DATA REVIEW:   CBC  Recent Labs Lab 03/07/15 1008  WBC 5.1  HGB 9.5*  HCT 29.7*  PLT 114*    Chemistries   Recent Labs Lab 03/05/15 2213 03/07/15 1008  NA 140 137  K 3.9 3.5  CL 97* 100*  CO2 28 30  GLUCOSE 107* 87  BUN 25* 15  CREATININE 6.01* 4.30*  CALCIUM 8.8* 8.3*  AST 38  --   ALT 22  --   ALKPHOS 100  --   BILITOT 0.9  --     Cardiac Enzymes  Recent Labs Lab 03/05/15 2213  TROPONINI <0.03    Microbiology Results  Results for orders placed or performed during the hospital encounter of 02/19/15  Blood culture (routine x 2)     Status: None   Collection Time: 02/19/15 12:20 PM  Result Value Ref Range Status   Specimen Description BLOOD  Final   Special Requests Immunocompromised  Final   Culture NO GROWTH 5 DAYS  Final   Report Status 02/24/2015 FINAL  Final  Blood culture (routine x 2)     Status: None   Collection Time: 02/19/15 12:30 PM  Result Value Ref Range Status   Specimen Description BLOOD  Final   Special Requests Immunocompromised  Final   Culture NO GROWTH 5 DAYS  Final   Report Status 02/24/2015 FINAL  Final  Body fluid culture     Status: None   Collection Time: 02/20/15  2:55 PM  Result Value Ref Range Status   Specimen Description PLEURAL  Final   Special Requests NONE  Final   Gram Stain   Final    FEW WBC SEEN RARE RED BLOOD CELLS FEW GRAM VARIABLE ROD    Culture NO GROWTH 4 DAYS  Final   Report Status 02/24/2015 FINAL  Final  Body fluid culture     Status: None   Collection Time: 02/21/15  4:30 PM  Result Value Ref Range Status   Specimen Description PLEURAL  Final   Special Requests NONE  Final   Gram Stain MANY WBC SEEN NO ORGANISMS SEEN   Final   Culture NO GROWTH 3 DAYS  Final   Report Status 02/26/2015 FINAL  Final    RADIOLOGY:  No results found.    Management plans discussed with the  patient, family and they are in agreement.  CODE STATUS:     Code Status Orders        Start     Ordered   03/06/15 0806  Full code   Continuous     03/06/15 0805      TOTAL TIME TAKING CARE OF THIS PATIENT: 40 minutes.    Henreitta Leber M.D on 03/09/2015 at 1:48 PM  Between 7am to 6pm - Pager - [phone]  After 6pm go to [email or web address] - password [email or web address]  Office  [phone]  CC: Primary care physician; Ellamae Sia, MD

## 2015-03-09 NOTE — Progress Notes (Signed)
Subjective:  Patient seen during dialysis.  Tolerating well. Patient was seen by a psychiatrist yesterday.  Somatization suspected Patient underwent EGD by Dr. Vira Agar.  Gastritis and gastroparesis was noted. This morning, she was nauseous but symptoms were controlled with Phenergan  Objective:  Vital signs in last 24 hours:  Temp:  [96.1 F (35.6 C)-98.2 F (36.8 C)] 97.8 F (36.6 C) (06/17 1322) Pulse Rate:  [55-73] 73 (06/17 1322) Resp:  [15-18] 18 (06/17 1322) BP: (102-160)/(41-64) 155/63 mmHg (06/17 1322) SpO2:  [94 %-100 %] 99 % (06/17 1322) Weight:  [81.5 kg (179 lb 10.8 oz)-81.511 kg (179 lb 11.2 oz)] 81.5 kg (179 lb 10.8 oz) (06/17 0953)  Weight change: 1.711 kg (3 lb 12.4 oz) Filed Weights   03/08/15 0404 03/09/15 0500 03/09/15 0953  Weight: 81.874 kg (180 lb 8 oz) 81.511 kg (179 lb 11.2 oz) 81.5 kg (179 lb 10.8 oz)    Intake/Output: I/O last 3 completed shifts: In: 303 [P.O.:250; I.V.:53] Out: 100 [Urine:100]   Intake/Output this shift:  Total I/O In: 240 [P.O.:240] Out: 1000 [Other:1000]  Physical Exam: General: NAD  Head: Normocephalic, atraumatic. Moist oral mucosal membranes  Eyes: Anicteric  Neck: Supple, trachea midline  Lungs:  Diminished BS at bases, normal effort  Heart: Regular rate and rhythm S1S2  Abdomen:  Soft, nontender,   Extremities:  no peripheral edema.  Neurologic: Awake, alert, follows commands  Skin: No rashes  Access: LUE AV access    Basic Metabolic Panel:  Recent Labs Lab 03/05/15 2213 03/07/15 1008  NA 140 137  K 3.9 3.5  CL 97* 100*  CO2 28 30  GLUCOSE 107* 87  BUN 25* 15  CREATININE 6.01* 4.30*  CALCIUM 8.8* 8.3*    Liver Function Tests:  Recent Labs Lab 03/05/15 2213  AST 38  ALT 22  ALKPHOS 100  BILITOT 0.9  PROT 6.9  ALBUMIN 3.5    Recent Labs Lab 03/05/15 2213  LIPASE 19*   No results for input(s): AMMONIA in the last 168 hours.  CBC:  Recent Labs Lab 03/05/15 2213 03/07/15 1008  WBC  5.2 5.1  NEUTROABS 2.9  --   HGB 10.1* 9.5*  HCT 32.1* 29.7*  MCV 85.2 84.3  PLT 119* 114*    Cardiac Enzymes:  Recent Labs Lab 03/05/15 2213 03/05/15 2217  CKTOTAL  --  47  TROPONINI <0.03  --     BNP: Invalid input(s): POCBNP  CBG:  Recent Labs Lab 03/08/15 0749 03/08/15 1158 03/08/15 1641 03/08/15 2131 03/09/15 0734  GLUCAP 91 90 85 109* 88    Microbiology: Results for orders placed or performed during the hospital encounter of 02/19/15  Blood culture (routine x 2)     Status: None   Collection Time: 02/19/15 12:20 PM  Result Value Ref Range Status   Specimen Description BLOOD  Final   Special Requests Immunocompromised  Final   Culture NO GROWTH 5 DAYS  Final   Report Status 02/24/2015 FINAL  Final  Blood culture (routine x 2)     Status: None   Collection Time: 02/19/15 12:30 PM  Result Value Ref Range Status   Specimen Description BLOOD  Final   Special Requests Immunocompromised  Final   Culture NO GROWTH 5 DAYS  Final   Report Status 02/24/2015 FINAL  Final  Body fluid culture     Status: None   Collection Time: 02/20/15  2:55 PM  Result Value Ref Range Status   Specimen Description PLEURAL  Final  Special Requests NONE  Final   Gram Stain   Final    FEW WBC SEEN RARE RED BLOOD CELLS FEW GRAM VARIABLE ROD    Culture NO GROWTH 4 DAYS  Final   Report Status 02/24/2015 FINAL  Final  Body fluid culture     Status: None   Collection Time: 02/21/15  4:30 PM  Result Value Ref Range Status   Specimen Description PLEURAL  Final   Special Requests NONE  Final   Gram Stain MANY WBC SEEN NO ORGANISMS SEEN   Final   Culture NO GROWTH 3 DAYS  Final   Report Status 02/26/2015 FINAL  Final    Coagulation Studies: No results for input(s): LABPROT, INR in the last 72 hours.  Urinalysis: No results for input(s): COLORURINE, LABSPEC, PHURINE, GLUCOSEU, HGBUR, BILIRUBINUR, KETONESUR, PROTEINUR, UROBILINOGEN, NITRITE, LEUKOCYTESUR in the last 72  hours.  Invalid input(s): APPERANCEUR    Imaging: No results found.   Medications:     . amLODipine  10 mg Oral Daily  . atorvastatin  40 mg Oral Daily  . cholecalciferol  800 Units Oral Daily  . citalopram  20 mg Oral Daily  . furosemide  20 mg Intravenous Once  . heparin  5,000 Units Subcutaneous 3 times per day  . hydrALAZINE  100 mg Oral 3 times per day  . insulin aspart  0-15 Units Subcutaneous TID WC  . labetalol  300 mg Oral BID  . lipase/protease/amylase  24,000 Units Oral With snacks  . lipase/protease/amylase  48,000 Units Oral TID WC  . loratadine  10 mg Oral QODAY  . metoCLOPramide  5 mg Oral QHS  . pantoprazole  40 mg Oral BID AC  . ranolazine  1,000 mg Oral BID  . sevelamer carbonate  800 mg Oral TID WC  . sodium chloride  3 mL Intravenous Q12H  . sucralfate  1 g Oral TID WC & HS   sodium chloride, acetaminophen **OR** acetaminophen, albuterol, HYDROcodone-acetaminophen, labetalol, morphine injection, nitroGLYCERIN, sodium chloride  Assessment/ Plan:  54 yo AAF with h/o ESRD, DM (30 yrs with retinopathy, neuropathy), OA, IBS, hyperlipidemia, h/o GI bleed, SHPTH, AOCD, diastolic heart failure , RCA stent Feb 2013, C. diff colitis 7/14, clotted RUE AVF 10/14, admission for shortness of breath 02/20/15. Abdominal pain  N/V June 14  1.  ESRD on HD MWF:    - Patient seen during dialysis Tolerating well  2.  Nausea/Vomiting- patient has h/o severe Gastroparesis.   -  GI eval ongoing -  EGD done this admission.  Gastritis and gastroparesis noted - patient will continue Phenergan, Reglan, and Carafate as directed by gastroenterologist 3.  Anemia of CKD: hgb 9.5. Will start EPO next treatment 4.  SHPTH of renal origin:  Monitor phos level   LOS: 3 Jetty Berland 6/17/20161:37 PM

## 2015-03-09 NOTE — Progress Notes (Signed)
SUBJECTIVE: vomited this morning   Filed Vitals:   03/08/15 2333 03/09/15 0500 03/09/15 0523 03/09/15 0735  BP: 154/60  144/58 150/64  Pulse: 64   65  Temp: 98.2 F (36.8 C)   98.1 F (36.7 C)  TempSrc: Oral   Oral  Resp: 16   16  Height:      Weight:  81.511 kg (179 lb 11.2 oz)    SpO2: 94%   98%    Intake/Output Summary (Last 24 hours) at 03/09/15 0751 Last data filed at 03/09/15 0734  Gross per 24 hour  Intake    303 ml  Output    100 ml  Net    203 ml    LABS: Basic Metabolic Panel:  Recent Labs  03/07/15 1008  NA 137  K 3.5  CL 100*  CO2 30  GLUCOSE 87  BUN 15  CREATININE 4.30*  CALCIUM 8.3*   Liver Function Tests: No results for input(s): AST, ALT, ALKPHOS, BILITOT, PROT, ALBUMIN in the last 72 hours. No results for input(s): LIPASE, AMYLASE in the last 72 hours. CBC:  Recent Labs  03/07/15 1008  WBC 5.1  HGB 9.5*  HCT 29.7*  MCV 84.3  PLT 114*   Cardiac Enzymes: No results for input(s): CKTOTAL, CKMB, CKMBINDEX, TROPONINI in the last 72 hours. BNP: Invalid input(s): POCBNP D-Dimer: No results for input(s): DDIMER in the last 72 hours. Hemoglobin A1C: No results for input(s): HGBA1C in the last 72 hours. Fasting Lipid Panel: No results for input(s): CHOL, HDL, LDLCALC, TRIG, CHOLHDL, LDLDIRECT in the last 72 hours. Thyroid Function Tests: No results for input(s): TSH, T4TOTAL, T3FREE, THYROIDAB in the last 72 hours.  Invalid input(s): FREET3 Anemia Panel: No results for input(s): VITAMINB12, FOLATE, FERRITIN, TIBC, IRON, RETICCTPCT in the last 72 hours.   PHYSICAL EXAM General: Well developed, well nourished, in no acute distress HEENT:  Normocephalic and atramatic Neck:  No JVD.  Lungs: Clear bilaterally to auscultation and percussion. Heart: HRRR . Normal S1 and S2 without gallops or murmurs.  Abdomen: Bowel sounds are positive, abdomen soft and non-tender  Msk:  Back normal, normal gait. Normal strength and tone for  age. Extremities: No clubbing, cyanosis or edema.   Neuro: Alert and oriented X 3. Psych:  Good affect, responds appropriately  TELEMETRY: NSR  ASSESSMENT AND PLAN: EGD revealed gastritis, and still vomiting. I will d/c coreg and increase labetolol as has normal LVEF.  Principal Problem:   Nausea & vomiting Active Problems:   End-stage renal disease on hemodialysis   Gastroparesis   Abdominal pain   DM (diabetes mellitus)   HTN (hypertension)   Coronary artery disease involving native coronary artery of native heart without angina pectoris   Somatic symptom disorder, mild    AmeLie Hollars A, MD, [facility] 03/09/2015 7:51 AM

## 2015-03-09 NOTE — Care Management Note (Deleted)
I will send updated records to patients home dialysis clinic at discharge.  [name]  [phone]

## 2015-03-13 LAB — PATHOLOGIST SMEAR REVIEW

## 2015-03-14 ENCOUNTER — Emergency Department
Admission: EM | Admit: 2015-03-14 | Discharge: 2015-03-14 | Disposition: A | Discharge status: Home / Self Care | Payer: Medicare Other | Attending: Emergency Medicine | Admitting: Emergency Medicine

## 2015-03-14 ENCOUNTER — Encounter: Payer: Self-pay | Admitting: *Deleted

## 2015-03-14 ENCOUNTER — Emergency Department: Payer: Medicare Other

## 2015-03-14 ENCOUNTER — Other Ambulatory Visit: Payer: Self-pay

## 2015-03-14 DIAGNOSIS — Z87891 Personal history of nicotine dependence: Secondary | ICD-10-CM | POA: Diagnosis not present

## 2015-03-14 DIAGNOSIS — Z7952 Long term (current) use of systemic steroids: Secondary | ICD-10-CM | POA: Diagnosis not present

## 2015-03-14 DIAGNOSIS — F451 Undifferentiated somatoform disorder: Secondary | ICD-10-CM | POA: Diagnosis present

## 2015-03-14 DIAGNOSIS — Z7982 Long term (current) use of aspirin: Secondary | ICD-10-CM | POA: Diagnosis not present

## 2015-03-14 DIAGNOSIS — N186 End stage renal disease: Secondary | ICD-10-CM | POA: Diagnosis not present

## 2015-03-14 DIAGNOSIS — F4322 Adjustment disorder with anxiety: Secondary | ICD-10-CM | POA: Diagnosis not present

## 2015-03-14 DIAGNOSIS — F329 Major depressive disorder, single episode, unspecified: Secondary | ICD-10-CM | POA: Diagnosis not present

## 2015-03-14 DIAGNOSIS — Z992 Dependence on renal dialysis: Secondary | ICD-10-CM | POA: Diagnosis not present

## 2015-03-14 DIAGNOSIS — R4182 Altered mental status, unspecified: Secondary | ICD-10-CM | POA: Diagnosis present

## 2015-03-14 DIAGNOSIS — Z79899 Other long term (current) drug therapy: Secondary | ICD-10-CM | POA: Diagnosis not present

## 2015-03-14 DIAGNOSIS — F419 Anxiety disorder, unspecified: Secondary | ICD-10-CM

## 2015-03-14 DIAGNOSIS — R443 Hallucinations, unspecified: Secondary | ICD-10-CM

## 2015-03-14 DIAGNOSIS — Z794 Long term (current) use of insulin: Secondary | ICD-10-CM | POA: Insufficient documentation

## 2015-03-14 DIAGNOSIS — F32A Depression, unspecified: Secondary | ICD-10-CM

## 2015-03-14 DIAGNOSIS — F070 Personality change due to known physiological condition: Secondary | ICD-10-CM | POA: Diagnosis not present

## 2015-03-14 DIAGNOSIS — I120 Hypertensive chronic kidney disease with stage 5 chronic kidney disease or end stage renal disease: Secondary | ICD-10-CM | POA: Diagnosis not present

## 2015-03-14 DIAGNOSIS — F688 Other specified disorders of adult personality and behavior: Secondary | ICD-10-CM

## 2015-03-14 LAB — URINALYSIS COMPLETE WITH MICROSCOPIC (ARMC ONLY)
Bacteria, UA: NONE SEEN
Bilirubin Urine: NEGATIVE
Glucose, UA: NEGATIVE mg/dL
Hgb urine dipstick: NEGATIVE
Ketones, ur: NEGATIVE mg/dL
Leukocytes, UA: NEGATIVE
Nitrite: NEGATIVE
Protein, ur: 100 mg/dL — AB
RBC / HPF: NONE SEEN RBC/hpf (ref 0–5)
Specific Gravity, Urine: 1.009 (ref 1.005–1.030)
pH: 9 — ABNORMAL HIGH (ref 5.0–8.0)

## 2015-03-14 LAB — CBC WITH DIFFERENTIAL/PLATELET
Band Neutrophils: 0 % (ref 0–10)
Basophils Absolute: 0 10*3/uL (ref 0.0–0.1)
Basophils Relative: 0 % (ref 0–1)
Blasts: 0 %
Eosinophils Absolute: 0.6 10*3/uL (ref 0.0–0.7)
Eosinophils Relative: 9 % — ABNORMAL HIGH (ref 0–5)
HCT: 30.5 % — ABNORMAL LOW (ref 35.0–47.0)
Hemoglobin: 9.8 g/dL — ABNORMAL LOW (ref 12.0–16.0)
Lymphocytes Relative: 49 % — ABNORMAL HIGH (ref 12–46)
Lymphs Abs: 3.3 10*3/uL (ref 0.7–4.0)
MCH: 27.3 pg (ref 26.0–34.0)
MCHC: 32.3 g/dL (ref 32.0–36.0)
MCV: 84.8 fL (ref 80.0–100.0)
Metamyelocytes Relative: 0 %
Monocytes Absolute: 0.3 10*3/uL (ref 0.1–1.0)
Monocytes Relative: 5 % (ref 3–12)
Myelocytes: 0 %
Neutro Abs: 2.5 10*3/uL (ref 1.7–7.7)
Neutrophils Relative %: 37 % — ABNORMAL LOW (ref 43–77)
Other: 0 %
Platelets: 162 10*3/uL (ref 150–440)
Promyelocytes Absolute: 0 %
RBC: 3.59 MIL/uL — ABNORMAL LOW (ref 3.80–5.20)
RDW: 22.2 % — ABNORMAL HIGH (ref 11.5–14.5)
WBC: 6.7 10*3/uL (ref 3.6–11.0)
nRBC: 0 /100 WBC

## 2015-03-14 LAB — COMPREHENSIVE METABOLIC PANEL
ALT: 20 U/L (ref 14–54)
AST: 28 U/L (ref 15–41)
Albumin: 3.5 g/dL (ref 3.5–5.0)
Alkaline Phosphatase: 82 U/L (ref 38–126)
Anion gap: 9 (ref 5–15)
BUN: 15 mg/dL (ref 6–20)
CO2: 34 mmol/L — ABNORMAL HIGH (ref 22–32)
Calcium: 9.2 mg/dL (ref 8.9–10.3)
Chloride: 91 mmol/L — ABNORMAL LOW (ref 101–111)
Creatinine, Ser: 4.64 mg/dL — ABNORMAL HIGH (ref 0.44–1.00)
GFR calc Af Amer: 11 mL/min — ABNORMAL LOW (ref >60)
GFR calc non Af Amer: 10 mL/min — ABNORMAL LOW (ref >60)
Glucose, Bld: 78 mg/dL (ref 65–99)
Potassium: 4 mmol/L (ref 3.5–5.1)
Sodium: 134 mmol/L — ABNORMAL LOW (ref 135–145)
Total Bilirubin: 0.7 mg/dL (ref 0.3–1.2)
Total Protein: 6.7 g/dL (ref 6.5–8.1)

## 2015-03-14 LAB — GLUCOSE, CAPILLARY
Glucose-Capillary: 98 mg/dL (ref 65–99)
Glucose-Capillary: 68 mg/dL (ref 65–99)
Glucose-Capillary: 79 mg/dL (ref 65–99)
Glucose-Capillary: 65 mg/dL (ref 65–99)

## 2015-03-14 LAB — TROPONIN I: Troponin I: <0.03 ng/mL (ref <0.031)

## 2015-03-14 MED ORDER — ALPRAZOLAM 0.25 MG PO TABS: 0.2500 mg | ORAL_TABLET | Freq: Two times a day (BID) | ORAL | Status: DC | PRN

## 2015-03-14 MED ORDER — ACETAMINOPHEN 325 MG PO TABS
ORAL_TABLET | ORAL | Status: AC
  Filled 2015-03-14: qty 2

## 2015-03-14 MED ORDER — ACETAMINOPHEN 325 MG PO TABS
650.0000 mg | ORAL_TABLET | Freq: Once | ORAL | Status: AC
  Administered 2015-03-14: 650 mg via ORAL

## 2015-03-14 MED ORDER — ALPRAZOLAM 0.5 MG PO TABS
ORAL_TABLET | ORAL | Status: AC
  Filled 2015-03-14: qty 1

## 2015-03-14 MED ORDER — ALPRAZOLAM 0.5 MG PO TABS
0.2500 mg | ORAL_TABLET | ORAL | Status: AC
  Administered 2015-03-14: 0.25 mg via ORAL

## 2015-03-14 NOTE — Discharge Instructions (Signed)
Suicidal Feelings, How to Help Yourself Everyone feels sad or unhappy at times, but depressing thoughts and feelings of hopelessness can lead to thoughts of suicide. It can seem as if life is too tough to handle. If you feel as though you have reached the point where suicide is the only answer, it is time to let someone know immediately.  HOW TO COPE AND PREVENT SUICIDE  Let family, friends, teachers, or counselors know. Get help. Try not to isolate yourself from those who care about you. Even though you may not feel sociable, talk with someone every day. It is best if it is face-to-face. Remember, they will want to help you.  Eat a regularly spaced and well-balanced diet.  Get plenty of rest.  Avoid alcohol and drugs because they will only make you feel worse and may also lower your inhibitions. Remove them from the home. If you are thinking of taking an overdose of your prescribed medicines, give your medicines to someone who can give them to you one day at a time. If you are on antidepressants, let your caregiver know of your feelings so he or she can provide a safer medicine, if that is a concern.  Remove weapons or poisons from your home.  Try to stick to routines. Follow a schedule and remind yourself that you have to keep that schedule every day.  Set some realistic goals and achieve them. Make a list and cross things off as you go. Accomplishments give a sense of worth. Wait until you are feeling better before doing things you find difficult or unpleasant to do.  If you are able, try to start exercising. Even half-hour periods of exercise each day will make you feel better. Getting out in the sun or into nature helps you recover from depression faster. If you have a favorite place to walk, take advantage of that.  Increase safe activities that have always given you pleasure. This may include playing your favorite music, reading a good book, painting a picture, or playing your favorite  instrument. Do whatever takes your mind off your depression.  Keep your living space well-lighted. GET HELP Contact a suicide hotline, crisis center, or local suicide prevention center for help right away. Local centers may include a hospital, clinic, community service organization, social service provider, or health department.  Call your local emergency services (911 in the [place]).  Call a suicide hotline:  1-800-273-TALK (1-(934)226-0731) in the [place].  1-800-SUICIDE (260) 386-1605) in the [place].  229-510-3034 in the [place] for Spanish-speaking counselors.  E7576207 937-583-3466) in the [place] for TTY users.  Visit the following websites for information and help:  National Suicide Prevention Lifeline: www.suicidepreventionlifeline.org  Hopeline: www.hopeline.[place] for Suicide Prevention: PromotionalLoans.co.za  For lesbian, gay, bisexual, transgender, or questioning youth, contact [employer]:  Q1458887 671-284-8015) in the [place].  www.thetrevorproject.org  In [place], treatment resources are listed in each [place] with listings available under [employer] for [employer] or similar titles. Another source for Crisis Centres by [place] is located at http://www.suicideprevention.ca/in-crisis-now/find-a-crisis-centre-now/crisis-centres Document Released: 03/15/2003 Document Revised: 12/01/2011 Document Reviewed: 01/03/2014 [facility] Patient Information 2015 [place], [place]. This information is not intended to replace advice given to you by your health care provider. Make sure you discuss any questions you have with your health care provider.  Panic Attacks Panic attacks are sudden, short-livedsurges of severe anxiety, fear, or discomfort. They may occur for no reason when you are relaxed, when you are anxious,  or when you are sleeping. Panic attacks may occur for a number of reasons:    Healthy people occasionally have panic attacks in extreme, life-threatening situations, such as war or natural disasters. Normal anxiety is a protective mechanism of the body that helps [place] react to danger (fight or flight response).  Panic attacks are often seen with anxiety disorders, such as panic disorder, social anxiety disorder, generalized anxiety disorder, and phobias. Anxiety disorders cause excessive or uncontrollable anxiety. They may interfere with your relationships or other life activities.  Panic attacks are sometimes seen with other mental illnesses, such as depression and posttraumatic stress disorder.  Certain medical conditions, prescription medicines, and drugs of abuse can cause panic attacks. SYMPTOMS  Panic attacks start suddenly, peak within 20 minutes, and are accompanied by four or more of the following symptoms:  Pounding heart or fast heart rate (palpitations).  Sweating.  Trembling or shaking.  Shortness of breath or feeling smothered.  Feeling choked.  Chest pain or discomfort.  Nausea or strange feeling in your stomach.  Dizziness, light-headedness, or feeling like you will faint.  Chills or hot flushes.  Numbness or tingling in your lips or hands and feet.  Feeling that things are not real or feeling that you are not yourself.  Fear of losing control or going crazy.  Fear of dying. Some of these symptoms can mimic serious medical conditions. For example, you may think you are having a heart attack. Although panic attacks can be very scary, they are not life threatening. DIAGNOSIS  Panic attacks are diagnosed through an assessment by your health care provider. Your health care provider will ask questions about your symptoms, such as where and when they occurred. Your health care provider will also ask about your medical history and use of alcohol and drugs, including prescription medicines. Your health care provider may order blood tests or other  studies to rule out a serious medical condition. Your health care provider may refer you to a mental health professional for further evaluation. TREATMENT   Most healthy people who have one or two panic attacks in an extreme, life-threatening situation will not require treatment.  The treatment for panic attacks associated with anxiety disorders or other mental illness typically involves counseling with a mental health professional, medicine, or a combination of both. Your health care provider will help determine what treatment is best for you.  Panic attacks due to physical illness usually go away with treatment of the illness. If prescription medicine is causing panic attacks, talk with your health care provider about stopping the medicine, decreasing the dose, or substituting another medicine.  Panic attacks due to alcohol or drug abuse go away with abstinence. Some adults need professional help in order to stop drinking or using drugs. HOME CARE INSTRUCTIONS   Take all medicines as directed by your health care provider.   Schedule and attend follow-up visits as directed by your health care provider. It is important to keep all your appointments. SEEK MEDICAL CARE IF:  You are not able to take your medicines as prescribed.  Your symptoms do not improve or get worse. SEEK IMMEDIATE MEDICAL CARE IF:   You experience panic attack symptoms that are different than your usual symptoms.  You have serious thoughts about hurting yourself or others.  You are taking medicine for panic attacks and have a serious side effect. MAKE SURE YOU:  Understand these instructions.  Will watch your condition.  Will get help right away if you are  not doing well or get worse. Document Released: 09/08/2005 Document Revised: 09/13/2013 Document Reviewed: 04/22/2013 [facility] Patient Information 2015 [place], [place]. This information is not intended to replace advice given to you by your health care  provider. Make sure you discuss any questions you have with your health care provider.

## 2015-03-14 NOTE — Consult Note (Signed)
[facility] Consult   Reason for Consult:  Consult for this 54 year old woman with multiple medical problems who is here in the emergency room for multiple somatic symptoms Referring Physician:  Joni Fears Patient Identification: [name] MRN:  [id number] Principal Diagnosis: Adjustment disorder with anxiety Diagnosis:   Patient Active Problem List   Diagnosis Date Noted  . Adjustment disorder with anxiety [F43.22] 03/14/2015  . Somatic symptom disorder, mild [F45.8] 03/08/2015  . Coronary artery disease involving native coronary artery of native heart without angina pectoris [I25.10]   . Nausea & vomiting [R11.2] 03/06/2015  . Abdominal pain [R10.9] 03/06/2015  . DM (diabetes mellitus) [E11.9] 03/06/2015  . HTN (hypertension) [I10] 03/06/2015  . Gastroparesis [K31.84] 02/24/2015  . Pleural effusion [J90] 02/19/2015  . HCAP (healthcare-associated pneumonia) [J18.9] 02/19/2015  . End-stage renal disease on hemodialysis [N18.6, Z99.2] 02/19/2015    Total Time spent with patient: 1 hour  Subjective:   [name] is a 54 y.o. female patient admitted with "it's just too much" patient is feeling very anxious and stressed out.  HPI:  Information from the patient and her son who is present in the room. Also from the chart the old chart and my previous evaluation of this patient. 55 year old woman who has a past history of some episodes of depression and at least one brief psychiatric hospitalization. He is having multiple somatic like complaints. She has episodes in which she will briefly seem to "blank out" although she snaps out of them very easily. She has episodes in which she will suddenly start to speak quickly and slurred her words but these also only last a brief amount of time. Son notes that he thinks that she is very stressed out and is just not acting like herself. Not getting very good sleep at night. She's been having visual hallucinations which she describes as  seeing people but also seeing things moving through her field of vision that happened intermittently. Not having auditory hallucinations. Mood is feeling anxious but denies any suicidal ideation. She has recently been taking bupropion 300 mg a day which was evidently prescribed to help her quit smoking, citalopram 20 mg a day.  Past psychiatric history: Has been treated for depression in the past. The details are not entirely clear. Had 1 brief hospitalization. No suicide attempts.  Medical history: Patient has end-stage renal failure and is on dialysis. History of coronary artery disease with chronic angina. Intractable nausea and vomiting at times. Diabetes. High blood pressure and social history: Patient lives with her 63 year old daughter and the daughter's children. She also has an adult son who does not live in the home. The dynamic between all of them is that the son is very concerned and anxious about his mother's well being but does not live with her. The daughter, whom I have not met, is portrayed as being immature and placing a lot of stress and burden on her mother. Nevertheless she does live in the house which everyone acknowledges is necessary for the patient's physical care.  Substance abuse history: Noncontributory except that she still continues to smoke intermittently despite being on home oxygen HPI Elements:   Quality:  Anxiety somatic symptom worsening. Severity:  Moderate without it being life threatening his disruptive. Timing:  Seems to be getting worse over the last couple months. Duration:  Intermittent symptoms sometimes will last for only seconds at a time. Context:  Worsening medical problems. It's a major stress for the patient that she requires more care  than she is able to provide for others..  Past Medical History:  Past Medical History  Diagnosis Date  . Asthma   . Collagen vascular disease   . Diabetes mellitus without complication   . Hypertension   . Coronary  artery disease     a. cath 2013: stenting to RCA (report not available); b. cath 2014: LM nl, pLAD 40%, mLAD nl, ost LCx 40%, mid LCx nl, pRCA 30% @ site of prior stent, mRCA 50%  . Diabetic neuropathy   . ESRD (end stage renal disease) on dialysis     M-W-F  . GERD (gastroesophageal reflux disease)   . Hx of pancreatitis 2015  . Myocardial infarction   . Pneumonia   . Mitral regurgitation     a. echo 10/2013: EF 62%, noWMA, mildly dilated LA, mild to mod MR/TR, GR1DD  . COPD (chronic obstructive pulmonary disease)     Past Surgical History  Procedure Laterality Date  . Cholecystectomy    . Appendectomy    . Abdominal hysterectomy    . Dialysis fistula creation Left     upper arm   Family History:  Family History  Problem Relation Age of Onset  . Kidney disease Mother   . Diabetes Mother    Social History:  History  Alcohol Use No     History  Drug Use No    History   Social History  . Marital Status: Divorced    Spouse Name: N/A  . Number of Children: N/A  . Years of Education: N/A   Social History Main Topics  . Smoking status: Former Smoker -- 0.50 packs/day    Types: Cigarettes    Quit date: 02/13/2015  . Smokeless tobacco: Never Used  . Alcohol Use: No  . Drug Use: No  . Sexual Activity: Not on file   Other Topics Concern  . None   Social History Narrative   Additional Social History:                          Allergies:   Allergies  Allergen Reactions  . Ace Inhibitors Swelling  . Codeine Nausea And Vomiting  . Compazine [Prochlorperazine Edisylate] Nausea And Vomiting  . Losartan Other (See Comments)  . Phenergan [Promethazine Hcl] Other (See Comments)    Hallucinations  . Zofran [Ondansetron Hcl] Other (See Comments)    Dementia sx  . Oxycodone Anxiety  . Tape Rash    Labs:  Results for orders placed or performed during the hospital encounter of 03/14/15 (from the past 48 hour(s))  Comprehensive metabolic panel     Status:  Abnormal   Collection Time: 03/14/15  8:09 AM  Result Value Ref Range   Sodium 134 (L) 135 - 145 mmol/L   Potassium 4.0 3.5 - 5.1 mmol/L   Chloride 91 (L) 101 - 111 mmol/L   CO2 34 (H) 22 - 32 mmol/L   Glucose, Bld 78 65 - 99 mg/dL   BUN 15 6 - 20 mg/dL   Creatinine, Ser 4.64 (H) 0.44 - 1.00 mg/dL   Calcium 9.2 8.9 - 10.3 mg/dL   Total Protein 6.7 6.5 - 8.1 g/dL   Albumin 3.5 3.5 - 5.0 g/dL   AST 28 15 - 41 U/L   ALT 20 14 - 54 U/L   Alkaline Phosphatase 82 38 - 126 U/L   Total Bilirubin 0.7 0.3 - 1.2 mg/dL   GFR calc non Af Amer 10 (L) >60  mL/min   GFR calc Af Amer 11 (L) >60 mL/min    Comment: (NOTE) The eGFR has been calculated using the CKD EPI equation. This calculation has not been validated in all clinical situations. eGFR's persistently <60 mL/min signify possible Chronic Kidney Disease.    Anion gap 9 5 - 15  Troponin I     Status: None   Collection Time: 03/14/15  8:09 AM  Result Value Ref Range   Troponin I <0.03 <0.031 ng/mL    Comment:        NO INDICATION OF MYOCARDIAL INJURY.   CBC with Differential     Status: Abnormal   Collection Time: 03/14/15  8:09 AM  Result Value Ref Range   WBC 6.7 3.6 - 11.0 K/uL   RBC 3.59 (L) 3.80 - 5.20 MIL/uL   Hemoglobin 9.8 (L) 12.0 - 16.0 g/dL   HCT 30.5 (L) 35.0 - 47.0 %   MCV 84.8 80.0 - 100.0 fL   MCH 27.3 26.0 - 34.0 pg   MCHC 32.3 32.0 - 36.0 g/dL   RDW 22.2 (H) 11.5 - 14.5 %   Platelets 162 150 - 440 K/uL   Neutrophils Relative % 37 (L) 43 - 77 %   Lymphocytes Relative 49 (H) 12 - 46 %   Monocytes Relative 5 3 - 12 %   Eosinophils Relative 9 (H) 0 - 5 %   Basophils Relative 0 0 - 1 %   Band Neutrophils 0 0 - 10 %   Metamyelocytes Relative 0 %   Myelocytes 0 %   Promyelocytes Absolute 0 %   Blasts 0 %   nRBC 0 0 /100 WBC   Other 0 %   Neutro Abs 2.5 1.7 - 7.7 K/uL   Lymphs Abs 3.3 0.7 - 4.0 K/uL   Monocytes Absolute 0.3 0.1 - 1.0 K/uL   Eosinophils Absolute 0.6 0.0 - 0.7 K/uL   Basophils Absolute 0.0  0.0 - 0.1 K/uL  Urinalysis complete, with microscopic     Status: Abnormal   Collection Time: 03/14/15  8:09 AM  Result Value Ref Range   Color, Urine YELLOW (A) YELLOW   APPearance CLEAR (A) CLEAR   Glucose, UA NEGATIVE NEGATIVE mg/dL   Bilirubin Urine NEGATIVE NEGATIVE   Ketones, ur NEGATIVE NEGATIVE mg/dL   Specific Gravity, Urine 1.009 1.005 - 1.030   Hgb urine dipstick NEGATIVE NEGATIVE   pH 9.0 (H) 5.0 - 8.0   Protein, ur 100 (A) NEGATIVE mg/dL   Nitrite NEGATIVE NEGATIVE   Leukocytes, UA NEGATIVE NEGATIVE   RBC / HPF NONE SEEN 0 - 5 RBC/hpf   WBC, UA 0-5 0 - 5 WBC/hpf   Bacteria, UA NONE SEEN NONE SEEN   Squamous Epithelial / LPF 0-5 (A) NONE SEEN  Glucose, capillary     Status: None   Collection Time: 03/14/15 12:01 PM  Result Value Ref Range   Glucose-Capillary 68 65 - 99 mg/dL  Glucose, capillary     Status: None   Collection Time: 03/14/15  3:22 PM  Result Value Ref Range   Glucose-Capillary 79 65 - 99 mg/dL  Glucose, capillary     Status: None   Collection Time: 03/14/15  5:20 PM  Result Value Ref Range   Glucose-Capillary 65 65 - 99 mg/dL    Vitals: Blood pressure 166/82, pulse 71, temperature 98.2 F (36.8 C), temperature source Oral, resp. rate 18, height _0  (1.651 m), weight 79.833 kg (176 lb), SpO2 100 %.  Risk to  Self: Is patient at risk for suicide?: No Risk to Others:   Prior Inpatient Therapy:   Prior Outpatient Therapy:    No current facility-administered medications for this encounter.   Current Outpatient Prescriptions  Medication Sig Dispense Refill  . albuterol (PROVENTIL HFA;VENTOLIN HFA) 108 (90 BASE) MCG/ACT inhaler Inhale 4-6 puffs by mouth every 4 hours as needed for wheezing, cough, and/or shortness of breath 1 Inhaler 1  . amLODipine (NORVASC) 10 MG tablet Take 10 mg by mouth daily.    Marland Kitchen aspirin EC 81 MG tablet Take 81 mg by mouth daily.    Marland Kitchen atorvastatin (LIPITOR) 40 MG tablet Take 40 mg by mouth daily.    Marland Kitchen buPROPion (WELLBUTRIN  SR) 150 MG 12 hr tablet Take 150 mg by mouth 2 (two) times daily.    . carvedilol (COREG) 25 MG tablet Take 25 mg by mouth 2 (two) times daily.    . cetirizine (ZYRTEC) 10 MG tablet Take 10 mg by mouth daily.    . citalopram (CELEXA) 20 MG tablet Take 20 mg by mouth daily.    . clopidogrel (PLAVIX) 75 MG tablet Take 75 mg by mouth daily.    Marland Kitchen gabapentin (NEURONTIN) 100 MG capsule Take 100 mg by mouth daily.    . hydrALAZINE (APRESOLINE) 100 MG tablet Take 1 tablet (100 mg total) by mouth 3 (three) times daily. 90 tablet 1  . HYDROcodone-acetaminophen (NORCO/VICODIN) 5-325 MG per tablet Take 1 tablet by mouth every 6 (six) hours as needed for moderate pain. 20 tablet 0  . hydrocortisone 1 % ointment Apply 1 application topically 2 (two) times daily.    . insulin aspart (NOVOLOG) 100 UNIT/ML injection Inject 2-6 Units into the skin 3 (three) times daily with meals. Pt uses as needed per sliding scale.    . insulin detemir (LEVEMIR) 100 UNIT/ML injection Inject 8 Units into the skin at bedtime.    . lipase/protease/amylase (CREON) 12000 UNITS CPEP capsule Take 24,000-48,000 Units by mouth 5 (five) times daily. Pt takes four capsules with meals and two capsules with snacks.    . metoCLOPramide (REGLAN) 5 MG tablet Take 1 tablet (5 mg total) by mouth 4 (four) times daily -  before meals and at bedtime. 40 tablet 0  . nitroGLYCERIN (NITROSTAT) 0.4 MG SL tablet Place 0.4 mg under the tongue every 5 (five) minutes as needed for chest pain.    . Omega-3 Fatty Acids (FISH OIL) 1000 MG CAPS Take 1 capsule by mouth 2 (two) times daily.    . ondansetron (ZOFRAN) 4 MG tablet Take 1 tablet (4 mg total) by mouth daily as needed for nausea or vomiting. 20 tablet 1  . pantoprazole (PROTONIX) 40 MG tablet Take 1 tablet (40 mg total) by mouth 2 (two) times daily before a meal. 60 tablet 0  . ranitidine (ZANTAC) 150 MG tablet Take 150 mg by mouth 2 (two) times daily.    . ranolazine (RANEXA) 1000 MG SR tablet Take  1,000 mg by mouth 2 (two) times daily.     . sevelamer carbonate (RENVELA) 800 MG tablet Take 800 mg by mouth 3 (three) times daily with meals.    . sucralfate (CARAFATE) 1 G tablet Take 1 tablet (1 g total) by mouth 4 (four) times daily -  with meals and at bedtime. 120 tablet 0  . Vitamin D, Cholecalciferol, 400 UNITS TABS Take 2 tablets by mouth daily.    Marland Kitchen ALPRAZolam (XANAX) 0.25 MG tablet Take 1 tablet (0.25 mg total)  by mouth 2 (two) times daily as needed for anxiety. 60 tablet 0  . ALPRAZolam (XANAX) 0.25 MG tablet Take 1 tablet (0.25 mg total) by mouth 2 (two) times daily as needed for anxiety. 60 tablet 0    Musculoskeletal: Strength & Muscle Tone: decreased Gait & Station: unsteady Patient leans: N/A  Psychiatric Specialty Exam: Physical Exam  Constitutional: She appears well-developed and well-nourished.  HENT:  Head: Normocephalic and atraumatic.  Eyes: Conjunctivae are normal. Pupils are equal, round, and reactive to light.  Neck: Normal range of motion.  Cardiovascular: Normal heart sounds.   Respiratory: Effort normal.  GI: Soft.  Musculoskeletal: Normal range of motion.  Neurological: She is alert.  Skin: Skin is warm and dry.  Psychiatric: Her speech is normal and behavior is normal. Thought content normal. Her mood appears anxious. Cognition and memory are normal. She expresses impulsivity.  Patient's mental status exam is actually a little bit odd. She presents with labile presentations that can vary second to second. She will have episodes in which she almost appears to be dissociating or falling asleep, but she will snap right out of these immediately and continue conversation. Looks like she's having a lot of anxious behavior and some attention seeking behavior probably unconsciously.    Review of Systems  Constitutional: Positive for fever and chills.  HENT: Negative.   Eyes: Negative.   Respiratory: Negative.   Cardiovascular: Negative.   Gastrointestinal:  Negative.   Musculoskeletal: Negative.   Skin: Negative.   Neurological: Negative.   Psychiatric/Behavioral: Positive for depression and hallucinations. Negative for suicidal ideas. The patient is nervous/anxious and has insomnia.     Blood pressure 166/82, pulse 71, temperature 98.2 F (36.8 C), temperature source Oral, resp. rate 18, height _0  (1.651 m), weight 79.833 kg (176 lb), SpO2 100 %.Body mass index is 29.29 kg/(m^2).  General Appearance: 6 looking  Eye Contact::  Fair  Speech:  Speech patterns vary from moment to moment as described above  Volume:  Decreased  Mood:  Anxious  Affect:  Labile  Thought Process:  Tangential  Orientation:  Full (Time, Place, and Person)  Thought Content:  Hallucinations: Visual  Suicidal Thoughts:  No  Homicidal Thoughts:  No  Memory:  Immediate;   Good Recent;   Fair Remote;   Fair  Judgement:  Intact  Insight:  Present  Psychomotor Activity:  Decreased  Concentration:  Fair  Recall:  [employer] of Knowledge:Fair  Language: Fair  Akathisia:  No  Handed:  Right  AIMS (if indicated):     Assets:  Desire for Improvement Intimacy Social Support  ADL's:  Impaired  Cognition: Impaired,  Mild  Sleep:      Medical Decision Making: New problem, with additional work up planned, Review of Psycho-Social Stressors (1), Review or order clinical lab tests (1), Review and summation of old records (2), Review of Medication Regimen & Side Effects (2) and Review of New Medication or Change in Dosage (2)  Treatment Plan Summary: Medication management and Plan There appear to probably be multiple factors contributing to her symptoms. A lot of that is probably that she has an anxious and somewhat histrionic style at baseline. She is clearly having a lot of increased anxiety and doesn't have very well developed ways of coping with it. The family dynamic seems to keep things stirred up. In the current situation the son would very much like for his mother  to be able to go somewhere where she could get  some sleep in a comfortable and restful place away from home. I think they had ideally thought this would be the psychiatry ward. Patient however is really not eligible for admission to psychiatry not only because she is not acutely dangerous to herself but because she is on home oxygen. Referral to geriatric psychiatry is not preferable for them because it requires waiting in the emergency room and going to a facility far away. Going back home makes everybody feel anxious. My suggestion is that she discontinue the bupropion for now which she has actually already done just in case that's making her more anxious. I'm also going to give her a 1 time dose of Xanax 0.25 mg here in the emergency room and then 0.25 twice a day when necessary for anxiety. She will be referred to the local mental health center or given phone numbers for other psychiatrist. Psychoeducation completed about the importance of getting in to see a therapist to work on this life transition.  Plan:  No evidence of imminent risk to self or others at present.   Patient does not meet criteria for psychiatric inpatient admission. Discussed crisis plan, support from social network, calling 911, coming to the Emergency Department, and calling Suicide Hotline. Disposition: Patient can be released from the emergency room does not meet commitment criteria doesn't wire referral to geriatric psychiatry at this point. She is to follow-up with [facility] a or a local psychiatrist  Alethia Berthold 03/14/2015 5:24 PM

## 2015-03-14 NOTE — ED Notes (Signed)
Patient transported back from [place] 

## 2015-03-14 NOTE — ED Notes (Signed)
MD at bedside. 

## 2015-03-14 NOTE — ED Notes (Addendum)
Per EMS:  Pt from home.  States that pts family tried to wake her this morning and pt was altered.  Per EMS pt has been A/O x 4 without distress.  VSS, CBG normal. Pt able to recall SSN, DL number.  Pt ambulatory.

## 2015-03-14 NOTE — ED Notes (Signed)
Esign not working. Patient son signed on last sheet of discharge paperwork. Son signed per request of patient.

## 2015-03-14 NOTE — Progress Notes (Signed)
   03/14/15 1100  Clinical Encounter Type  Visited With Patient and family together  Visit Type Initial  Referral From Other (Comment) Control and instrumentation engineer)  Consult/Referral To Chaplain  Spiritual Encounters  Spiritual Needs Emotional  Stress Factors  Patient Stress Factors Health changes  Family Stress Factors Exhausted   Faith Tradition: Christian, non denominational Status: alert and oriented, oxygen in nose Family: sister and son Visit Assessment: Chaplain visited with patient and her visitors. As per request, the chaplain led a circular prayer for healing with patient and family. 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 She says she's challenged with these episodes and her son says that she has fallen (Sunday). The patient asked for an education for adv directives but she says that she already has directives on file and she now knows that if she needs to make updates, the staff as well as the chaplains will make those for her. The patient says that she will try to get some rest. She says she likes to cook and the nursing home is missing her cooking.  Chaplains and pastoral care are available 24x7 via pager [phone] or by submtting orders online

## 2015-03-14 NOTE — ED Provider Notes (Addendum)
[facility] Emergency Department Provider Note   ____________________________________________  Time seen: On arrival by EMS I have reviewed the triage vital signs and the triage nursing note.  HISTORY  Chief Complaint Altered Mental Status   Historian Patient, although somewhat limited Son who came later  HPI [name] is a 54 y.o. female who lives at home with grandchildren, and was recently admitted in the hospital for nausea vomiting and abdominal pain consistent with her prior history of multiple episodes of gastroparesis. Her son states that when she was admitted to the hospital over a week ago, that morning upon admission he noted that she didn't seem quite right in terms of her personality. He felt that something was going on neurologically because she was "speaking too fast." She has a history of slower speech at baseline, and he felt like her speech was pressured. The patient herself has not noted any changes in her speech. There've been no recent fevers. Patient states she has a history of chronic headaches and does have a headache right now which is considered mild and global. She's had no vision changes. Her son reports no recent changes in medication but he is questioning whether or not her medications could be giving her the symptoms.    Past Medical History  Diagnosis Date  . Asthma   . Collagen vascular disease   . Diabetes mellitus without complication   . Hypertension   . Coronary artery disease     a. cath 2013: stenting to RCA (report not available); b. cath 2014: LM nl, pLAD 40%, mLAD nl, ost LCx 40%, mid LCx nl, pRCA 30% @ site of prior stent, mRCA 50%  . Diabetic neuropathy   . ESRD (end stage renal disease) on dialysis     M-W-F  . GERD (gastroesophageal reflux disease)   . Hx of pancreatitis 2015  . Myocardial infarction   . Pneumonia   . Mitral regurgitation     a. echo 10/2013: EF 62%, noWMA, mildly dilated LA, mild to mod  MR/TR, GR1DD  . COPD (chronic obstructive pulmonary disease)    CVA, depression for which she is taking Celexa, attempts at smoking cessation for which she is using Wellbutrin  Patient Active Problem List   Diagnosis Date Noted  . Somatic symptom disorder, mild 03/08/2015  . Coronary artery disease involving native coronary artery of native heart without angina pectoris   . Nausea & vomiting 03/06/2015  . Abdominal pain 03/06/2015  . DM (diabetes mellitus) 03/06/2015  . HTN (hypertension) 03/06/2015  . Gastroparesis 02/24/2015  . Pleural effusion 02/19/2015  . HCAP (healthcare-associated pneumonia) 02/19/2015  . End-stage renal disease on hemodialysis 02/19/2015    Past Surgical History  Procedure Laterality Date  . Cholecystectomy    . Appendectomy    . Abdominal hysterectomy    . Dialysis fistula creation Left     upper arm    Current Outpatient Rx  Name  Route  Sig  Dispense  Refill  . albuterol (PROVENTIL HFA;VENTOLIN HFA) 108 (90 BASE) MCG/ACT inhaler      Inhale 4-6 puffs by mouth every 4 hours as needed for wheezing, cough, and/or shortness of breath   1 Inhaler   1   . amLODipine (NORVASC) 10 MG tablet   Oral   Take 10 mg by mouth daily.         Marland Kitchen aspirin EC 81 MG tablet   Oral   Take 81 mg by mouth daily.         Marland Kitchen  atorvastatin (LIPITOR) 40 MG tablet   Oral   Take 40 mg by mouth daily.         Marland Kitchen buPROPion (WELLBUTRIN SR) 150 MG 12 hr tablet   Oral   Take 150 mg by mouth 2 (two) times daily.         . carvedilol (COREG) 25 MG tablet   Oral   Take 25 mg by mouth 2 (two) times daily.         . cetirizine (ZYRTEC) 10 MG tablet   Oral   Take 10 mg by mouth daily.         . citalopram (CELEXA) 20 MG tablet   Oral   Take 20 mg by mouth daily.         . clopidogrel (PLAVIX) 75 MG tablet   Oral   Take 75 mg by mouth daily.         Marland Kitchen gabapentin (NEURONTIN) 100 MG capsule   Oral   Take 100 mg by mouth daily.         . hydrALAZINE  (APRESOLINE) 100 MG tablet   Oral   Take 1 tablet (100 mg total) by mouth 3 (three) times daily.   90 tablet   1   . HYDROcodone-acetaminophen (NORCO/VICODIN) 5-325 MG per tablet   Oral   Take 1 tablet by mouth every 6 (six) hours as needed for moderate pain.   20 tablet   0   . hydrocortisone 1 % ointment   Topical   Apply 1 application topically 2 (two) times daily.         . insulin aspart (NOVOLOG) 100 UNIT/ML injection   Subcutaneous   Inject 2-6 Units into the skin 3 (three) times daily with meals. Pt uses as needed per sliding scale.         . insulin detemir (LEVEMIR) 100 UNIT/ML injection   Subcutaneous   Inject 8 Units into the skin at bedtime.         . lipase/protease/amylase (CREON) 12000 UNITS CPEP capsule   Oral   Take 24,000-48,000 Units by mouth 5 (five) times daily. Pt takes four capsules with meals and two capsules with snacks.         . metoCLOPramide (REGLAN) 5 MG tablet   Oral   Take 1 tablet (5 mg total) by mouth 4 (four) times daily -  before meals and at bedtime.   40 tablet   0   . nitroGLYCERIN (NITROSTAT) 0.4 MG SL tablet   Sublingual   Place 0.4 mg under the tongue every 5 (five) minutes as needed for chest pain.         . Omega-3 Fatty Acids (FISH OIL) 1000 MG CAPS   Oral   Take 1 capsule by mouth 2 (two) times daily.         . ondansetron (ZOFRAN) 4 MG tablet   Oral   Take 1 tablet (4 mg total) by mouth daily as needed for nausea or vomiting.   20 tablet   1   . pantoprazole (PROTONIX) 40 MG tablet   Oral   Take 1 tablet (40 mg total) by mouth 2 (two) times daily before a meal.   60 tablet   0   . ranitidine (ZANTAC) 150 MG tablet   Oral   Take 150 mg by mouth 2 (two) times daily.         . ranolazine (RANEXA) 1000 MG SR tablet   Oral   Take  1,000 mg by mouth 2 (two) times daily.          . sevelamer carbonate (RENVELA) 800 MG tablet   Oral   Take 800 mg by mouth 3 (three) times daily with meals.          . sucralfate (CARAFATE) 1 G tablet   Oral   Take 1 tablet (1 g total) by mouth 4 (four) times daily -  with meals and at bedtime.   120 tablet   0   . Vitamin D, Cholecalciferol, 400 UNITS TABS   Oral   Take 2 tablets by mouth daily.           Allergies Ace inhibitors; Codeine; Compazine; Losartan; Phenergan; Zofran; Oxycodone; and Tape  Family History  Problem Relation Age of Onset  . Kidney disease Mother   . Diabetes Mother     Social History History  Substance Use Topics  . Smoking status: Former Smoker -- 0.50 packs/day    Types: Cigarettes    Quit date: 02/13/2015  . Smokeless tobacco: Never Used  . Alcohol Use: No    Review of Systems  Constitutional: Negative for fever. Eyes: Negative for visual changes. ENT: Negative for sore throat. Cardiovascular: Negative for chest pain. Respiratory: Negative for shortness of breath. No cough Gastrointestinal: Negative for abdominal pain, vomiting and diarrhea. Genitourinary: Negative for dysuria, only make small amounts of urine. Musculoskeletal: Negative for back pain. Skin: Negative for rash. Neurological: Negative for focal weakness or numbness 10 point Review of Systems otherwise negative ____________________________________________   PHYSICAL EXAM:  VITAL SIGNS: ED Triage Vitals  Enc Vitals Group     BP 03/14/15 0751 173/73 mmHg     Pulse Rate 03/14/15 0751 79     Resp 03/14/15 0751 18     Temp 03/14/15 0751 98.2 F (36.8 C)     Temp Source 03/14/15 0751 Oral     SpO2 03/14/15 0751 100 %     Weight 03/14/15 0747 176 lb (79.833 kg)     Height 03/14/15 0747 5\' 5"  (1.651 m)     Head Cir --      Peak Flow --      Pain Score --      Pain Loc --      Pain Edu? --      Excl. in [place]? --      Constitutional: Alert and oriented. Well appearing and in no distress. Eyes: Conjunctivae are normal. PERRL. Normal extraocular movements. ENT   Head: Normocephalic and atraumatic.   Nose: No  congestion/rhinnorhea.   Mouth/Throat: Mucous membranes are moist.   Neck: No stridor. Cardiovascular/Chest: Normal rate, regular rhythm.  No murmurs, rubs, or gallops. Respiratory: Normal respiratory effort without tachypnea nor retractions. Breath sounds are clear and equal bilaterally. No wheezes/rales/rhonchi. Gastrointestinal: Soft. No distention, no guarding, no rebound. Nontender. Obese.  Genitourinary/rectal: Deferred Musculoskeletal: Nontender with normal range of motion in all extremities. No joint effusions.  No lower extremity tenderness nor edema. Neurologic:  Normal speech and language. Poor historian. No gross focal neurologic deficits are appreciated. 5 out of 5 strength in 4 x-rays. No focal sensory deficit. Skin:  Skin is warm, dry and intact. No rash noted. Psychiatric: No depressed mood. Patient does speak in full sentences and occasionally trail off before finishing her since. No obvious hallucinations.   ____________________________________________   EKG I, Lisa Roca, MD, the attending physician have personally viewed and interpreted all ECGs.  80 bpm. Undetermined rhythm. Nonspecific intraventricular conduction delay.  Interference of baseline on EKG. Nonspecific T wave. ____________________________________________  LABS (pertinent positives/negatives)  Significant for creatinine 4.6, sodium 134, chloride 91 Troponin less than 0.03 White blood cell count 6.7 Hemoglobin 9.8  Urinalysis negative for blood or evidence of urinary tract infection ____________________________________________  RADIOLOGY All Xrays were viewed by me. Imagine interpreted by Radiologist.  Chest x-ray portable: Worsening congestive heart failure and edema, with bilateral pleural effusions CT head noncontrast: Negative __________________________________________  PROCEDURES  Procedure(s) performed: None Critical Care performed:  None  ____________________________________________   ED COURSE / ASSESSMENT AND PLAN  Pertinent labs & imaging results that were available during my care of the patient were reviewed by me and considered in my medical decision making (see chart for details).  After starting evaluation based on EMS reporting altered mental status, the son did arrive and provided better history that the symptoms have been ongoing over a week now. She does not appear to have any new acute stroke, or intracranial medical or surgical emergency. Her laboratory evaluation shows no cause for her "personality change ". I went over the medication list with her son, and noted that she is on Neurontin, Celexa, and Wellbutrin which all could contribute to neurologic/psychiatric symptoms. She is currently being prescribed these by primary care physician, and I have suggested that they make an appointment with a psychiatrist to discuss her symptoms as well as review her psychiatric medications.   Patient's additional family members including sister showed up who also indicated that the patient this morning seemed paranoid, anxious, and to be hallucinating. When I asked the patient again she states that she is depressed and becomes a little tearful. She does have a bit of labile mood. I discussed with the patient and the family about having her evaluated by a psychiatrist in the ED. They do want to do this.  Psychiatry evaluation is pending. Patient care transferred to Dr. Joni Fears at shift change at 3 PM.  Patient / Family / Caregiver informed of clinical course, medical decision-making process, and agree with plan.   I discussed return precautions, follow-up instructions, and discharged instructions with patient and/or family.  ___________________________________________   FINAL CLINICAL IMPRESSION(S) / ED DIAGNOSES   Final diagnoses:  Depression  Hallucinations  Personality change      Lisa Roca, MD 03/14/15  [place], MD 03/14/15 [phone]

## 2015-03-14 NOTE — ED Provider Notes (Signed)
-----------------------------------------   7:09 PM on 03/14/2015 -----------------------------------------  Patient remains medically stable. Evaluated by [facility]. I reviewed Dr. Weber Cooks consult note. Patient is psychiatrically stable and will be treated for anxiety and referred to our A for continued management. Dr. Weber Cooks also recommended discontinuing her bupropion which the patient will do.  Carrie Mew, MD 03/14/15 1910

## 2015-03-15 NOTE — BH Assessment (Signed)
Assessment Note  [name] is an 54 y.o. female who presents to the ER, via her son [phone]) due to concerns they were having about her current behaviors. According to the son the pt. Was recently on the medical floor and in the ER and since then she "hasn't been acting right." Her speech has been rapid, she was responding to internal stimuli, confused and rambling about things that doesn't make sense.  Son states her symptoms occur when she is stressed and feeling overwhelmed. During the interview, the patient accidental spilled some water. She started crying and asking for forgiveness. Her voice changed and started to sound like a young child. Similar situation took place, while interview her and the son. However, the second time, she was able to carry on a conversation about events in her past. The times she wasn't talking, she was crying and asking for forgiveness. When the son said something that was inaccurate, she would stop crying and correct him, then start back crying.  Pt. Denies any past or current SI and attempts.  She denies the use of any mind altering substances, past or present.   Per request of Psych MD (Dr. Weber Cooks), writer provided the pt. with information and instructions on how to access Out Pt. Mental Health Treatment ([facility]).   Axis I: Adjustment Disorder with Anxiety Axis III:  Past Medical History  Diagnosis Date  . Asthma   . Collagen vascular disease   . Diabetes mellitus without complication   . Hypertension   . Coronary artery disease     a. cath 2013: stenting to RCA (report not available); b. cath 2014: LM nl, pLAD 40%, mLAD nl, ost LCx 40%, mid LCx nl, pRCA 30% @ site of prior stent, mRCA 50%  . Diabetic neuropathy   . ESRD (end stage renal disease) on dialysis     M-W-F  . GERD (gastroesophageal reflux disease)   . Hx of pancreatitis 2015  . Myocardial infarction   . Pneumonia   . Mitral regurgitation     a. echo 10/2013: EF 62%, noWMA,  mildly dilated LA, mild to mod MR/TR, GR1DD  . COPD (chronic obstructive pulmonary disease)    Axis IV: economic problems, other psychosocial or environmental problems, problems related to social environment, problems with access to health care services and problems with primary support group  Past Medical History:  Past Medical History  Diagnosis Date  . Asthma   . Collagen vascular disease   . Diabetes mellitus without complication   . Hypertension   . Coronary artery disease     a. cath 2013: stenting to RCA (report not available); b. cath 2014: LM nl, pLAD 40%, mLAD nl, ost LCx 40%, mid LCx nl, pRCA 30% @ site of prior stent, mRCA 50%  . Diabetic neuropathy   . ESRD (end stage renal disease) on dialysis     M-W-F  . GERD (gastroesophageal reflux disease)   . Hx of pancreatitis 2015  . Myocardial infarction   . Pneumonia   . Mitral regurgitation     a. echo 10/2013: EF 62%, noWMA, mildly dilated LA, mild to mod MR/TR, GR1DD  . COPD (chronic obstructive pulmonary disease)     Past Surgical History  Procedure Laterality Date  . Cholecystectomy    . Appendectomy    . Abdominal hysterectomy    . Dialysis fistula creation Left     upper arm    Family History:  Family History  Problem Relation Age of Onset  .  Kidney disease Mother   . Diabetes Mother     Social History:  reports that she quit smoking about 4 weeks ago. Her smoking use included Cigarettes. She smoked 0.50 packs per day. She has never used smokeless tobacco. She reports that she does not drink alcohol or use illicit drugs.  Additional Social History:  Alcohol / Drug Use Pain Medications: None Reported Prescriptions: None Reported Over the Counter: None Reported History of alcohol / drug use?: No history of alcohol / drug abuse Longest period of sobriety (when/how long):  (None Reported) Negative Consequences of Use:  (None Reported) Withdrawal Symptoms:  (None Reported)  CIWA: CIWA-Ar BP: (!) 165/65  mmHg Pulse Rate: 77 COWS:    Allergies:  Allergies  Allergen Reactions  . Ace Inhibitors Swelling  . Codeine Nausea And Vomiting  . Compazine [Prochlorperazine Edisylate] Nausea And Vomiting  . Losartan Other (See Comments)  . Phenergan [Promethazine Hcl] Other (See Comments)    Hallucinations  . Zofran [Ondansetron Hcl] Other (See Comments)    Dementia sx  . Oxycodone Anxiety  . Tape Rash    Home Medications:  (Not in a hospital admission)  OB/GYN Status:  No LMP recorded. Patient has had a hysterectomy.  General Assessment Data Location of Assessment: [facility] ED TTS Assessment: In system Is this a Tele or Face-to-Face Assessment?: Face-to-Face Is this an Initial Assessment or a Re-assessment for this encounter?: Initial Assessment Marital status: Single Maiden name: n/a Is patient pregnant?: No Pregnancy Status: No Living Arrangements: Children, Other relatives (Daughter and Children lives with her) Can pt return to current living arrangement?: Yes Admission Status: Voluntary Is patient capable of signing voluntary admission?: Yes Referral Source: Self/Family/Friend Insurance type: Medicare  Medical Screening Exam ([place]) Medical Exam completed: Yes  Crisis Care Plan Living Arrangements: Children, Other relatives (Daughter and Children lives with her) Name of Psychiatrist: n/a Name of Therapist: n/a  Education Status Is patient currently in school?: No Current Grade: n/a Highest grade of school patient has completed: 12 Name of school: n/a Contact person: n/a  Risk to self with the past 6 months Suicidal Ideation: No Has patient been a risk to self within the past 6 months prior to admission? : No Suicidal Intent: No Has patient had any suicidal intent within the past 6 months prior to admission? : No Is patient at risk for suicide?: No Suicidal Plan?: No Has patient had any suicidal plan within the past 6 months prior to admission? : No Access  to Means: No What has been your use of drugs/alcohol within the last 12 months?: None Reported Previous Attempts/Gestures: No How many times?: 0 Other Self Harm Risks: 0 Triggers for Past Attempts: None known Intentional Self Injurious Behavior: None Family Suicide History: No Recent stressful life event(s):  (None Reported) Persecutory voices/beliefs?: No Depression: Yes Depression Symptoms: Insomnia, Tearfulness, Fatigue, Feeling angry/irritable Substance abuse history and/or treatment for substance abuse?: No Suicide prevention information given to non-admitted patients: Not applicable  Risk to Others within the past 6 months Homicidal Ideation: No Does patient have any lifetime risk of violence toward others beyond the six months prior to admission? : No Thoughts of Harm to Others: No Current Homicidal Intent: No Current Homicidal Plan: No Access to Homicidal Means: No Identified Victim: None Reported History of harm to others?: No Assessment of Violence: None Noted Violent Behavior Description: None Reported Does patient have access to weapons?: No Criminal Charges Pending?: No Does patient have a court date: No Is  patient on probation?: No  Psychosis Hallucinations: None noted Delusions: None noted  Mental Status Report Appearance/Hygiene: Unremarkable, In scrubs, In hospital gown Eye Contact: Poor Motor Activity: Unremarkable Speech: Loud, Soft, Slow Level of Consciousness: Alert, Restless Mood: Anxious, Depressed, Guilty, Pleasant Affect: Anxious, Depressed, Appropriate to circumstance, Sad Anxiety Level: None Thought Processes: Coherent, Relevant Judgement: Unimpaired Orientation: Person, Place, Time, Situation, Appropriate for developmental age Obsessive Compulsive Thoughts/Behaviors: Moderate  Cognitive Functioning Concentration: Decreased Memory: Remote Intact, Recent Impaired IQ: Average Insight: Poor Impulse Control: Poor Appetite: Poor Weight  Loss: 0 Weight Gain: 0 Sleep: Decreased Total Hours of Sleep: 2 Vegetative Symptoms: None  ADLScreening [facility] Assessment Services) Patient's cognitive ability adequate to safely complete daily activities?: Yes Patient able to express need for assistance with ADLs?: Yes Independently performs ADLs?: Yes (appropriate for developmental age)  Prior Inpatient Therapy Prior Inpatient Therapy: No Prior Therapy Dates: n/a Prior Therapy Facilty/Provider(s): n/a Reason for Treatment: n/a  Prior Outpatient Therapy Prior Outpatient Therapy: Yes Prior Therapy Dates: Unknown Prior Therapy Facilty/Provider(s): Unknown Reason for Treatment: Unknown Does patient have an ACCT team?: No Does patient have Intensive In-House Services?  : No Does patient have [facility] services? : No Does patient have P4CC services?: No  ADL Screening (condition at time of admission) Patient's cognitive ability adequate to safely complete daily activities?: Yes Patient able to express need for assistance with ADLs?: Yes Independently performs ADLs?: Yes (appropriate for developmental age)       Abuse/Neglect Assessment (Assessment to be complete while patient is alone) Physical Abuse: Denies Verbal Abuse: Denies Sexual Abuse: Denies Exploitation of patient/patient's resources: Denies Self-Neglect: Denies Values / Beliefs Cultural Requests During Hospitalization: None Spiritual Requests During Hospitalization: None Consults Spiritual Care Consult Needed: No Social Work Consult Needed: No Regulatory affairs officer (For Healthcare) Does patient have an advance directive?: No    Additional Information 1:1 In Past 12 Months?: No CIRT Risk: No Elopement Risk: No Does patient have medical clearance?: Yes  Child/Adolescent Assessment Running Away Risk: Denies (Pt. is an adult)  Disposition:  Disposition Initial Assessment Completed for this Encounter: Yes Disposition of Patient: Outpatient treatment Type of  outpatient treatment: Adult  On Site Evaluation by:   Reviewed with Physician:     Gunnar Fusi, MS, LCAS, LPC, [place], CCSI 03/15/2015 9:39 AM

## 2015-03-19 ENCOUNTER — Encounter: Payer: Self-pay | Admitting: Unknown Physician Specialty

## 2015-03-20 NOTE — Addendum Note (Signed)
Addendum  created 03/20/15 1612 by Gunnar Bulla, MD   Modules edited: Anesthesia Attestations

## 2015-03-29 ENCOUNTER — Encounter: Payer: Self-pay | Admitting: Physical Therapy

## 2015-03-29 ENCOUNTER — Ambulatory Visit: Payer: Medicare Other | Attending: Primary Care | Admitting: Physical Therapy

## 2015-03-29 DIAGNOSIS — R262 Difficulty in walking, not elsewhere classified: Secondary | ICD-10-CM | POA: Insufficient documentation

## 2015-03-29 DIAGNOSIS — R531 Weakness: Secondary | ICD-10-CM | POA: Insufficient documentation

## 2015-03-29 NOTE — Therapy (Signed)
[place] MAIN [facility] SERVICES [place], [place], [address] Phone: [phone]   Fax:  [phone]  Physical Therapy Evaluation  Patient Details  Name: [name] MRN: [id number] Date of Birth: 01-22-1961 Referring Provider:  Freddy Finner, NP  Encounter Date: 03/29/2015    Past Medical History  Diagnosis Date  . Asthma   . Collagen vascular disease   . Diabetes mellitus without complication   . Hypertension   . Coronary artery disease     a. cath 2013: stenting to RCA (report not available); b. cath 2014: LM nl, pLAD 40%, mLAD nl, ost LCx 40%, mid LCx nl, pRCA 30% @ site of prior stent, mRCA 50%  . Diabetic neuropathy   . ESRD (end stage renal disease) on dialysis     M-W-F  . GERD (gastroesophageal reflux disease)   . Hx of pancreatitis 2015  . Myocardial infarction   . Pneumonia   . Mitral regurgitation     a. echo 10/2013: EF 62%, noWMA, mildly dilated LA, mild to mod MR/TR, GR1DD  . COPD (chronic obstructive pulmonary disease)     Past Surgical History  Procedure Laterality Date  . Cholecystectomy    . Appendectomy    . Abdominal hysterectomy    . Dialysis fistula creation Left     upper arm  . Esophagogastroduodenoscopy N/A 03/08/2015    Procedure: ESOPHAGOGASTRODUODENOSCOPY (EGD);  Surgeon: Manya Silvas, MD;  Location: [facility] ENDOSCOPY;  Service: Endoscopy;  Laterality: N/A;    There were no vitals filed for this visit.  Visit Diagnosis:  No diagnosis found.      Subjective Assessment - 03/29/15 1355    Subjective Patient was able to ambulate wihtout AD prior to recent hospitilization Jun3 4, 2016. Now she is having difficulty with walking.    Currently in Pain? Yes   Pain Location Knee   Pain Orientation Right;Left   Pain Descriptors / Indicators Aching;Dull   Pain Frequency Constant            [facility] PT Assessment - 03/29/15 1358    Assessment   Medical Diagnosis pneumonia   Onset  Date/Surgical Date 03/21/15   Hand Dominance Left   Next MD Visit 03/30/15   Prior Therapy na   Precautions   Precautions Fall   Balance Screen   Has the patient fallen in the past 6 months Yes   How many times? 2   Has the patient had a decrease in activity level because of a fear of falling?  Yes   Is the patient reluctant to leave their home because of a fear of falling?  Yes   [place] Private residence   Living Arrangements Other relatives  grand children   Available Help at Discharge Family   Type of [place] Access Other (comment)   Home Equipment [place] - single point;Walker - 2 wheels;Wheelchair - manual   Prior Function   Level of Independence Independent   Vocation On disability   Sensation   Additional Comments intermitteint tingling in her feet   Functional Tests   Functional tests Sit to Stand   Transfers   Five time sit to stand comments  41.68 sec       Strength: BLE 2/3 hip flex, abd, extesnion Strength BLE knees 4/5 Strength BLE ankles 3/5 Mobility transfers sit to stand with CGA and slow and guarded Mobility sit to supine SBA, supine to sit CGA  and slow and guarded Ambulation with RW and slow gait speed of .18 m/sec and  Greatest distance is 50 feet. TUG = 45.65 sec Sensation is impaired with tingling intermittent in BLE feet Standing dynamic balance is poor with need of AD for static standing                          PT Long Term Goals - 03/29/15 1427    PT LONG TERM GOAL #4   Title Patient (< 70 years old) will complete five times sit to stand test in < 10 seconds indicating an increased LE strength and improved balance                Problem List Patient Active Problem List   Diagnosis Date Noted  . Adjustment disorder with anxiety 03/14/2015  . Somatic symptom disorder, mild 03/08/2015  . Coronary artery disease involving native coronary artery of native heart without  angina pectoris   . Nausea & vomiting 03/06/2015  . Abdominal pain 03/06/2015  . DM (diabetes mellitus) 03/06/2015  . HTN (hypertension) 03/06/2015  . Gastroparesis 02/24/2015  . Pleural effusion 02/19/2015  . HCAP (healthcare-associated pneumonia) 02/19/2015  . End-stage renal disease on hemodialysis 02/19/2015    Alanson Puls 03/29/2015, 2:30 PM  [place] MAIN [facility] SERVICES [address] [place], [place], [address] Phone: [phone]   Fax:  [phone]

## 2015-03-30 ENCOUNTER — Other Ambulatory Visit: Payer: Self-pay

## 2015-03-30 ENCOUNTER — Encounter: Payer: Self-pay | Admitting: Emergency Medicine

## 2015-03-30 ENCOUNTER — Emergency Department
Admission: EM | Admit: 2015-03-30 | Discharge: 2015-03-30 | Disposition: A | Discharge status: Home / Self Care | Payer: Medicare Other | Source: Home / Self Care | Attending: Emergency Medicine | Admitting: Emergency Medicine

## 2015-03-30 DIAGNOSIS — I2510 Atherosclerotic heart disease of native coronary artery without angina pectoris: Secondary | ICD-10-CM | POA: Diagnosis present

## 2015-03-30 DIAGNOSIS — Z7982 Long term (current) use of aspirin: Secondary | ICD-10-CM

## 2015-03-30 DIAGNOSIS — R112 Nausea with vomiting, unspecified: Secondary | ICD-10-CM

## 2015-03-30 DIAGNOSIS — J9601 Acute respiratory failure with hypoxia: Secondary | ICD-10-CM | POA: Diagnosis present

## 2015-03-30 DIAGNOSIS — Z79899 Other long term (current) drug therapy: Secondary | ICD-10-CM

## 2015-03-30 DIAGNOSIS — R0902 Hypoxemia: Secondary | ICD-10-CM | POA: Diagnosis not present

## 2015-03-30 DIAGNOSIS — E1122 Type 2 diabetes mellitus with diabetic chronic kidney disease: Secondary | ICD-10-CM | POA: Diagnosis present

## 2015-03-30 DIAGNOSIS — J45909 Unspecified asthma, uncomplicated: Secondary | ICD-10-CM | POA: Diagnosis present

## 2015-03-30 DIAGNOSIS — E876 Hypokalemia: Secondary | ICD-10-CM | POA: Diagnosis present

## 2015-03-30 DIAGNOSIS — M1990 Unspecified osteoarthritis, unspecified site: Secondary | ICD-10-CM | POA: Diagnosis present

## 2015-03-30 DIAGNOSIS — N186 End stage renal disease: Secondary | ICD-10-CM | POA: Diagnosis present

## 2015-03-30 DIAGNOSIS — Z7902 Long term (current) use of antithrombotics/antiplatelets: Secondary | ICD-10-CM | POA: Insufficient documentation

## 2015-03-30 DIAGNOSIS — K219 Gastro-esophageal reflux disease without esophagitis: Secondary | ICD-10-CM | POA: Diagnosis present

## 2015-03-30 DIAGNOSIS — Z87891 Personal history of nicotine dependence: Secondary | ICD-10-CM | POA: Insufficient documentation

## 2015-03-30 DIAGNOSIS — E11649 Type 2 diabetes mellitus with hypoglycemia without coma: Secondary | ICD-10-CM | POA: Diagnosis present

## 2015-03-30 DIAGNOSIS — R1013 Epigastric pain: Secondary | ICD-10-CM | POA: Insufficient documentation

## 2015-03-30 DIAGNOSIS — J449 Chronic obstructive pulmonary disease, unspecified: Secondary | ICD-10-CM | POA: Diagnosis present

## 2015-03-30 DIAGNOSIS — K3184 Gastroparesis: Secondary | ICD-10-CM | POA: Diagnosis present

## 2015-03-30 DIAGNOSIS — I252 Old myocardial infarction: Secondary | ICD-10-CM

## 2015-03-30 DIAGNOSIS — L89152 Pressure ulcer of sacral region, stage 2: Secondary | ICD-10-CM | POA: Diagnosis present

## 2015-03-30 DIAGNOSIS — I120 Hypertensive chronic kidney disease with stage 5 chronic kidney disease or end stage renal disease: Secondary | ICD-10-CM | POA: Insufficient documentation

## 2015-03-30 DIAGNOSIS — E785 Hyperlipidemia, unspecified: Secondary | ICD-10-CM | POA: Diagnosis present

## 2015-03-30 DIAGNOSIS — E1140 Type 2 diabetes mellitus with diabetic neuropathy, unspecified: Secondary | ICD-10-CM | POA: Insufficient documentation

## 2015-03-30 DIAGNOSIS — Z794 Long term (current) use of insulin: Secondary | ICD-10-CM

## 2015-03-30 DIAGNOSIS — R110 Nausea: Secondary | ICD-10-CM

## 2015-03-30 DIAGNOSIS — Z955 Presence of coronary angioplasty implant and graft: Secondary | ICD-10-CM

## 2015-03-30 DIAGNOSIS — Z833 Family history of diabetes mellitus: Secondary | ICD-10-CM

## 2015-03-30 DIAGNOSIS — Z885 Allergy status to narcotic agent status: Secondary | ICD-10-CM

## 2015-03-30 DIAGNOSIS — I5031 Acute diastolic (congestive) heart failure: Principal | ICD-10-CM | POA: Diagnosis present

## 2015-03-30 DIAGNOSIS — I340 Nonrheumatic mitral (valve) insufficiency: Secondary | ICD-10-CM | POA: Diagnosis present

## 2015-03-30 DIAGNOSIS — K861 Other chronic pancreatitis: Secondary | ICD-10-CM | POA: Diagnosis present

## 2015-03-30 DIAGNOSIS — D638 Anemia in other chronic diseases classified elsewhere: Secondary | ICD-10-CM | POA: Diagnosis present

## 2015-03-30 DIAGNOSIS — N2581 Secondary hyperparathyroidism of renal origin: Secondary | ICD-10-CM | POA: Diagnosis present

## 2015-03-30 DIAGNOSIS — F17210 Nicotine dependence, cigarettes, uncomplicated: Secondary | ICD-10-CM | POA: Diagnosis present

## 2015-03-30 DIAGNOSIS — Z888 Allergy status to other drugs, medicaments and biological substances status: Secondary | ICD-10-CM

## 2015-03-30 DIAGNOSIS — E1143 Type 2 diabetes mellitus with diabetic autonomic (poly)neuropathy: Secondary | ICD-10-CM | POA: Diagnosis present

## 2015-03-30 DIAGNOSIS — Z992 Dependence on renal dialysis: Secondary | ICD-10-CM

## 2015-03-30 DIAGNOSIS — E11319 Type 2 diabetes mellitus with unspecified diabetic retinopathy without macular edema: Secondary | ICD-10-CM | POA: Diagnosis present

## 2015-03-30 LAB — CBC WITH DIFFERENTIAL/PLATELET
Band Neutrophils: 0 % (ref 0–10)
Basophils Absolute: 0 10*3/uL (ref 0.0–0.1)
Basophils Relative: 0 % (ref 0–1)
Blasts: 0 %
Eosinophils Absolute: 0.3 10*3/uL (ref 0.0–0.7)
Eosinophils Relative: 4 % (ref 0–5)
HCT: 24.4 % — ABNORMAL LOW (ref 35.0–47.0)
Hemoglobin: 8 g/dL — ABNORMAL LOW (ref 12.0–16.0)
Lymphocytes Relative: 34 % (ref 12–46)
Lymphs Abs: 2.2 10*3/uL (ref 0.7–4.0)
MCH: 27.8 pg (ref 26.0–34.0)
MCHC: 32.7 g/dL (ref 32.0–36.0)
MCV: 84.9 fL (ref 80.0–100.0)
Metamyelocytes Relative: 0 %
Monocytes Absolute: 0.6 10*3/uL (ref 0.1–1.0)
Monocytes Relative: 9 % (ref 3–12)
Myelocytes: 0 %
Neutro Abs: 3.5 10*3/uL (ref 1.7–7.7)
Neutrophils Relative %: 53 % (ref 43–77)
Other: 0 %
Platelets: 169 10*3/uL (ref 150–440)
Promyelocytes Absolute: 0 %
RBC: 2.87 MIL/uL — ABNORMAL LOW (ref 3.80–5.20)
RDW: 19.9 % — ABNORMAL HIGH (ref 11.5–14.5)
WBC: 6.6 10*3/uL (ref 3.6–11.0)
nRBC: 0 /100 WBC

## 2015-03-30 LAB — COMPREHENSIVE METABOLIC PANEL
ALT: 12 U/L — ABNORMAL LOW (ref 14–54)
AST: 20 U/L (ref 15–41)
Albumin: 2.9 g/dL — ABNORMAL LOW (ref 3.5–5.0)
Alkaline Phosphatase: 104 U/L (ref 38–126)
Anion gap: 7 (ref 5–15)
BUN: 9 mg/dL (ref 6–20)
CO2: 31 mmol/L (ref 22–32)
Calcium: 8.1 mg/dL — ABNORMAL LOW (ref 8.9–10.3)
Chloride: 98 mmol/L — ABNORMAL LOW (ref 101–111)
Creatinine, Ser: 2.94 mg/dL — ABNORMAL HIGH (ref 0.44–1.00)
GFR calc Af Amer: 20 mL/min — ABNORMAL LOW (ref >60)
GFR calc non Af Amer: 17 mL/min — ABNORMAL LOW (ref >60)
Glucose, Bld: 89 mg/dL (ref 65–99)
Potassium: 2.8 mmol/L — CL (ref 3.5–5.1)
Sodium: 136 mmol/L (ref 135–145)
Total Bilirubin: 0.6 mg/dL (ref 0.3–1.2)
Total Protein: 6.6 g/dL (ref 6.5–8.1)

## 2015-03-30 LAB — LIPASE, BLOOD: Lipase: 20 U/L — ABNORMAL LOW (ref 22–51)

## 2015-03-30 MED ORDER — PROMETHAZINE HCL 25 MG PO TABS: 25.0000 mg | ORAL_TABLET | Freq: Four times a day (QID) | ORAL | Status: DC | PRN

## 2015-03-30 MED ORDER — MORPHINE SULFATE 4 MG/ML IJ SOLN
4.0000 mg | Freq: Once | INTRAMUSCULAR | Status: AC
  Administered 2015-03-30: 4 mg via INTRAVENOUS
  Filled 2015-03-30: qty 1

## 2015-03-30 MED ORDER — ONDANSETRON 4 MG PO TBDP
4.0000 mg | ORAL_TABLET | Freq: Once | ORAL | Status: AC
  Administered 2015-03-30: 4 mg via ORAL
  Filled 2015-03-30: qty 1

## 2015-03-30 MED ORDER — METOCLOPRAMIDE HCL 5 MG/ML IJ SOLN: 10.0000 mg | Freq: Once | INTRAMUSCULAR | Status: DC

## 2015-03-30 MED ORDER — PROMETHAZINE HCL 25 MG/ML IJ SOLN
25.0000 mg | Freq: Once | INTRAMUSCULAR | Status: AC
  Administered 2015-03-30: 25 mg via INTRAVENOUS
  Filled 2015-03-30: qty 1

## 2015-03-30 NOTE — ED Notes (Signed)
Pt states she can take zofran ODT.

## 2015-03-30 NOTE — ED Notes (Signed)
Ems pt from dialysis, 1hour 66min into tx with sudden onset of RUQ abd pain, hx of pancreatitis last seen for same approx 2-3 weeks  ago , x1  access to fistula intact

## 2015-03-30 NOTE — ED Provider Notes (Signed)
[facility] Emergency Department Provider Note  Time seen: 8:55 AM  I have reviewed the triage vital signs and the nursing notes.   HISTORY  Chief Complaint Abdominal Pain    HPI [name] is a 54 y.o. female with a past medical history of diabetes, hypertension, pancreatitis, end-stage renal disease on hemodialysis, myocardial infarction, COPD, gastroparesis, who presents the emergency department with nausea, vomiting, and upper abdominal pain. According to the patient she has been nauseated with vomiting since last night. While obtaining her dialysis today approximately 1-1/2 hours into dialysis she began experiencing upper abdominal pain along with her nausea and vomiting so the patient came to the emergency department for evaluation. Patient states this feels like her pancreatitis or gastroparesis. Denies fever. Denies black or bloody vomit. Last bowel movement was 2 days ago and normal according to the patient. Describes her pain as significant, 10/10. Also significant nausea and vomiting. No modifying factor identified. Describes her pain as burning/aching.     Past Medical History  Diagnosis Date  . Asthma   . Collagen vascular disease   . Diabetes mellitus without complication   . Hypertension   . Coronary artery disease     a. cath 2013: stenting to RCA (report not available); b. cath 2014: LM nl, pLAD 40%, mLAD nl, ost LCx 40%, mid LCx nl, pRCA 30% @ site of prior stent, mRCA 50%  . Diabetic neuropathy   . ESRD (end stage renal disease) on dialysis     M-W-F  . GERD (gastroesophageal reflux disease)   . Hx of pancreatitis 2015  . Myocardial infarction   . Pneumonia   . Mitral regurgitation     a. echo 10/2013: EF 62%, noWMA, mildly dilated LA, mild to mod MR/TR, GR1DD  . COPD (chronic obstructive pulmonary disease)     Patient Active Problem List   Diagnosis Date Noted  . Adjustment disorder with anxiety 03/14/2015  . Somatic symptom  disorder, mild 03/08/2015  . Coronary artery disease involving native coronary artery of native heart without angina pectoris   . Nausea & vomiting 03/06/2015  . Abdominal pain 03/06/2015  . DM (diabetes mellitus) 03/06/2015  . HTN (hypertension) 03/06/2015  . Gastroparesis 02/24/2015  . Pleural effusion 02/19/2015  . HCAP (healthcare-associated pneumonia) 02/19/2015  . End-stage renal disease on hemodialysis 02/19/2015    Past Surgical History  Procedure Laterality Date  . Cholecystectomy    . Appendectomy    . Abdominal hysterectomy    . Dialysis fistula creation Left     upper arm  . Esophagogastroduodenoscopy N/A 03/08/2015    Procedure: ESOPHAGOGASTRODUODENOSCOPY (EGD);  Surgeon: Manya Silvas, MD;  Location: [facility] ENDOSCOPY;  Service: Endoscopy;  Laterality: N/A;    Current Outpatient Rx  Name  Route  Sig  Dispense  Refill  . albuterol (PROVENTIL HFA;VENTOLIN HFA) 108 (90 BASE) MCG/ACT inhaler      Inhale 4-6 puffs by mouth every 4 hours as needed for wheezing, cough, and/or shortness of breath   1 Inhaler   1   . ALPRAZolam (XANAX) 0.25 MG tablet   Oral   Take 1 tablet (0.25 mg total) by mouth 2 (two) times daily as needed for anxiety.   60 tablet   0   . ALPRAZolam (XANAX) 0.25 MG tablet   Oral   Take 1 tablet (0.25 mg total) by mouth 2 (two) times daily as needed for anxiety.   60 tablet   0   . amLODipine (NORVASC) 10  MG tablet   Oral   Take 10 mg by mouth daily.         Marland Kitchen aspirin EC 81 MG tablet   Oral   Take 81 mg by mouth daily.         Marland Kitchen atorvastatin (LIPITOR) 40 MG tablet   Oral   Take 40 mg by mouth daily.         Marland Kitchen buPROPion (WELLBUTRIN SR) 150 MG 12 hr tablet   Oral   Take 150 mg by mouth 2 (two) times daily.         . carvedilol (COREG) 25 MG tablet   Oral   Take 25 mg by mouth 2 (two) times daily.         . cetirizine (ZYRTEC) 10 MG tablet   Oral   Take 10 mg by mouth daily.         . citalopram (CELEXA) 20 MG  tablet   Oral   Take 20 mg by mouth daily.         . clopidogrel (PLAVIX) 75 MG tablet   Oral   Take 75 mg by mouth daily.         Marland Kitchen gabapentin (NEURONTIN) 100 MG capsule   Oral   Take 100 mg by mouth daily.         . hydrALAZINE (APRESOLINE) 100 MG tablet   Oral   Take 1 tablet (100 mg total) by mouth 3 (three) times daily.   90 tablet   1   . HYDROcodone-acetaminophen (NORCO/VICODIN) 5-325 MG per tablet   Oral   Take 1 tablet by mouth every 6 (six) hours as needed for moderate pain.   20 tablet   0   . hydrocortisone 1 % ointment   Topical   Apply 1 application topically 2 (two) times daily.         . insulin aspart (NOVOLOG) 100 UNIT/ML injection   Subcutaneous   Inject 2-6 Units into the skin 3 (three) times daily with meals. Pt uses as needed per sliding scale.         . insulin detemir (LEVEMIR) 100 UNIT/ML injection   Subcutaneous   Inject 8 Units into the skin at bedtime.         . lipase/protease/amylase (CREON) 12000 UNITS CPEP capsule   Oral   Take 24,000-48,000 Units by mouth 5 (five) times daily. Pt takes four capsules with meals and two capsules with snacks.         . metoCLOPramide (REGLAN) 5 MG tablet   Oral   Take 1 tablet (5 mg total) by mouth 4 (four) times daily -  before meals and at bedtime.   40 tablet   0   . nitroGLYCERIN (NITROSTAT) 0.4 MG SL tablet   Sublingual   Place 0.4 mg under the tongue every 5 (five) minutes as needed for chest pain.         . Omega-3 Fatty Acids (FISH OIL) 1000 MG CAPS   Oral   Take 1 capsule by mouth 2 (two) times daily.         . ondansetron (ZOFRAN) 4 MG tablet   Oral   Take 1 tablet (4 mg total) by mouth daily as needed for nausea or vomiting.   20 tablet   1   . pantoprazole (PROTONIX) 40 MG tablet   Oral   Take 1 tablet (40 mg total) by mouth 2 (two) times daily before a meal.   60  tablet   0   . ranitidine (ZANTAC) 150 MG tablet   Oral   Take 150 mg by mouth 2 (two) times  daily.         . ranolazine (RANEXA) 1000 MG SR tablet   Oral   Take 1,000 mg by mouth 2 (two) times daily.          . sevelamer carbonate (RENVELA) 800 MG tablet   Oral   Take 800 mg by mouth 3 (three) times daily with meals.         . sucralfate (CARAFATE) 1 G tablet   Oral   Take 1 tablet (1 g total) by mouth 4 (four) times daily -  with meals and at bedtime.   120 tablet   0   . Vitamin D, Cholecalciferol, 400 UNITS TABS   Oral   Take 2 tablets by mouth daily.           Allergies Ace inhibitors; Codeine; Compazine; Losartan; Phenergan; Zofran; Oxycodone; and Tape  Family History  Problem Relation Age of Onset  . Kidney disease Mother   . Diabetes Mother     Social History History  Substance Use Topics  . Smoking status: Former Smoker -- 0.50 packs/day    Types: Cigarettes    Quit date: 02/13/2015  . Smokeless tobacco: Never Used  . Alcohol Use: No    Review of Systems Constitutional: Negative for fever. Cardiovascular: Negative for chest pain. Respiratory: Negative for shortness of breath. Gastrointestinal: Upper abdominal pain. Positive for nausea and vomiting. Negative for diarrhea. Musculoskeletal: Negative for back pain. Neurological: Negative for headache  10-point ROS otherwise negative.  ____________________________________________   PHYSICAL EXAM:  VITAL SIGNS: ED Triage Vitals  Enc Vitals Group     BP --      Pulse --      Resp --      Temp --      Temp src --      SpO2 --      Weight 03/30/15 0838 195 lb 5.2 oz (88.6 kg)     Height 03/30/15 0838 5\' 5"  (1.651 m)     Head Cir --      Peak Flow --      Pain Score 03/30/15 0839 10     Pain Loc --      Pain Edu? --      Excl. in [place]? --     Constitutional: Alert and oriented. Moderate distress due to nausea/vomiting and pain. Eyes: Normal exam ENT   Mouth/Throat: Mucous membranes are moist. Cardiovascular: Normal rate, regular rhythm.  Respiratory: Normal respiratory  effort without tachypnea nor retractions. Breath sounds are clear  Gastrointestinal: Moderate upper abdominal tenderness palpation. No rebound or guarding. No distention noted. Musculoskeletal: Nontender with normal range of motion in all extremities. Left upper extremity fistula remains accessed. Neurologic:  Normal speech and language. No gross focal neurologic deficits  Skin:  Skin is warm, dry and intact.  Psychiatric: Mood and affect are normal. Speech and behavior are normal.   ____________________________________________    EKG  EKG reviewed and interpreted by myself appears to show sinus arrhythmia versus atrial fibrillation at 78 bpm, normal axis, nonspecific ST changes present.  ____________________________________________   INITIAL IMPRESSION / ASSESSMENT AND PLAN / ED COURSE  Pertinent labs & imaging results that were available during my care of the patient were reviewed by me and considered in my medical decision making (see chart for details).  Patient presents the emergency  department with upper abdominal pain, nausea, vomiting. Patient states it feels like her gastroparesis or possibly pancreatitis. We will check labs, treat pain and nausea, and closely monitor in the emergency department. EKG appears largely unchanged from prior including 03/14/15. Likely junctional rhythm.  Labs are largely within normal limits. Patient sleeping comfortably, when awoken she says her pain and nausea are gone. We'll discharge the patient home with primary care follow-up. Patient is agreeable to this plan. ____________________________________________   FINAL CLINICAL IMPRESSION(S) / ED DIAGNOSES  Nausea and vomiting Upper abdominal pain   Harvest Dark, MD 03/30/15 1123

## 2015-03-30 NOTE — Discharge Instructions (Signed)

## 2015-03-30 NOTE — ED Notes (Signed)
Pt up to CCU 13 with RN to get dialysis fistula deaccessed.

## 2015-04-01 ENCOUNTER — Emergency Department: Payer: Medicare Other

## 2015-04-01 ENCOUNTER — Encounter: Payer: Self-pay | Admitting: Emergency Medicine

## 2015-04-01 ENCOUNTER — Inpatient Hospital Stay
Admission: EM | Admit: 2015-04-01 | Discharge: 2015-04-05 | DRG: 291 | Disposition: A | Discharge status: Skilled Nursing Facility | Payer: Medicare Other | Attending: Internal Medicine | Admitting: Internal Medicine

## 2015-04-01 DIAGNOSIS — L8990 Pressure ulcer of unspecified site, unspecified stage: Secondary | ICD-10-CM

## 2015-04-01 DIAGNOSIS — E11649 Type 2 diabetes mellitus with hypoglycemia without coma: Secondary | ICD-10-CM

## 2015-04-01 DIAGNOSIS — N186 End stage renal disease: Secondary | ICD-10-CM | POA: Diagnosis present

## 2015-04-01 DIAGNOSIS — I2510 Atherosclerotic heart disease of native coronary artery without angina pectoris: Secondary | ICD-10-CM | POA: Diagnosis present

## 2015-04-01 DIAGNOSIS — E876 Hypokalemia: Secondary | ICD-10-CM

## 2015-04-01 DIAGNOSIS — N2581 Secondary hyperparathyroidism of renal origin: Secondary | ICD-10-CM

## 2015-04-01 DIAGNOSIS — Z955 Presence of coronary angioplasty implant and graft: Secondary | ICD-10-CM | POA: Diagnosis not present

## 2015-04-01 DIAGNOSIS — D638 Anemia in other chronic diseases classified elsewhere: Secondary | ICD-10-CM | POA: Diagnosis present

## 2015-04-01 DIAGNOSIS — Z79899 Other long term (current) drug therapy: Secondary | ICD-10-CM | POA: Diagnosis not present

## 2015-04-01 DIAGNOSIS — J810 Acute pulmonary edema: Secondary | ICD-10-CM

## 2015-04-01 DIAGNOSIS — E1143 Type 2 diabetes mellitus with diabetic autonomic (poly)neuropathy: Secondary | ICD-10-CM

## 2015-04-01 DIAGNOSIS — L89152 Pressure ulcer of sacral region, stage 2: Secondary | ICD-10-CM | POA: Diagnosis present

## 2015-04-01 DIAGNOSIS — I5031 Acute diastolic (congestive) heart failure: Secondary | ICD-10-CM | POA: Diagnosis present

## 2015-04-01 DIAGNOSIS — I120 Hypertensive chronic kidney disease with stage 5 chronic kidney disease or end stage renal disease: Secondary | ICD-10-CM | POA: Diagnosis present

## 2015-04-01 DIAGNOSIS — M1990 Unspecified osteoarthritis, unspecified site: Secondary | ICD-10-CM | POA: Diagnosis present

## 2015-04-01 DIAGNOSIS — J9601 Acute respiratory failure with hypoxia: Secondary | ICD-10-CM | POA: Diagnosis present

## 2015-04-01 DIAGNOSIS — Z7982 Long term (current) use of aspirin: Secondary | ICD-10-CM | POA: Diagnosis not present

## 2015-04-01 DIAGNOSIS — K861 Other chronic pancreatitis: Secondary | ICD-10-CM | POA: Diagnosis present

## 2015-04-01 DIAGNOSIS — Z7902 Long term (current) use of antithrombotics/antiplatelets: Secondary | ICD-10-CM | POA: Diagnosis not present

## 2015-04-01 DIAGNOSIS — K3184 Gastroparesis: Secondary | ICD-10-CM

## 2015-04-01 DIAGNOSIS — E11319 Type 2 diabetes mellitus with unspecified diabetic retinopathy without macular edema: Secondary | ICD-10-CM | POA: Diagnosis present

## 2015-04-01 DIAGNOSIS — Z992 Dependence on renal dialysis: Secondary | ICD-10-CM | POA: Diagnosis not present

## 2015-04-01 DIAGNOSIS — R5381 Other malaise: Secondary | ICD-10-CM

## 2015-04-01 DIAGNOSIS — K219 Gastro-esophageal reflux disease without esophagitis: Secondary | ICD-10-CM | POA: Diagnosis present

## 2015-04-01 DIAGNOSIS — E1122 Type 2 diabetes mellitus with diabetic chronic kidney disease: Secondary | ICD-10-CM | POA: Diagnosis present

## 2015-04-01 DIAGNOSIS — I5033 Acute on chronic diastolic (congestive) heart failure: Secondary | ICD-10-CM

## 2015-04-01 DIAGNOSIS — R0902 Hypoxemia: Secondary | ICD-10-CM | POA: Diagnosis present

## 2015-04-01 DIAGNOSIS — Z888 Allergy status to other drugs, medicaments and biological substances status: Secondary | ICD-10-CM | POA: Diagnosis not present

## 2015-04-01 DIAGNOSIS — Z885 Allergy status to narcotic agent status: Secondary | ICD-10-CM | POA: Diagnosis not present

## 2015-04-01 DIAGNOSIS — F17210 Nicotine dependence, cigarettes, uncomplicated: Secondary | ICD-10-CM | POA: Diagnosis present

## 2015-04-01 DIAGNOSIS — R0989 Other specified symptoms and signs involving the circulatory and respiratory systems: Secondary | ICD-10-CM

## 2015-04-01 DIAGNOSIS — J449 Chronic obstructive pulmonary disease, unspecified: Secondary | ICD-10-CM | POA: Diagnosis present

## 2015-04-01 DIAGNOSIS — I340 Nonrheumatic mitral (valve) insufficiency: Secondary | ICD-10-CM | POA: Diagnosis present

## 2015-04-01 DIAGNOSIS — Z833 Family history of diabetes mellitus: Secondary | ICD-10-CM | POA: Diagnosis not present

## 2015-04-01 DIAGNOSIS — R531 Weakness: Secondary | ICD-10-CM

## 2015-04-01 DIAGNOSIS — J45909 Unspecified asthma, uncomplicated: Secondary | ICD-10-CM | POA: Diagnosis present

## 2015-04-01 DIAGNOSIS — Z794 Long term (current) use of insulin: Secondary | ICD-10-CM | POA: Diagnosis not present

## 2015-04-01 DIAGNOSIS — R112 Nausea with vomiting, unspecified: Secondary | ICD-10-CM

## 2015-04-01 DIAGNOSIS — E785 Hyperlipidemia, unspecified: Secondary | ICD-10-CM | POA: Diagnosis present

## 2015-04-01 DIAGNOSIS — I252 Old myocardial infarction: Secondary | ICD-10-CM | POA: Diagnosis not present

## 2015-04-01 HISTORY — DX: Acute diastolic (congestive) heart failure: I50.31

## 2015-04-01 LAB — CBC WITH DIFFERENTIAL/PLATELET
Band Neutrophils: 0 % (ref 0–10)
Basophils Absolute: 0 10*3/uL (ref 0.0–0.1)
Basophils Relative: 0 % (ref 0–1)
Blasts: 0 %
Eosinophils Absolute: 0.2 10*3/uL (ref 0.0–0.7)
Eosinophils Relative: 4 % (ref 0–5)
HCT: 24.6 % — ABNORMAL LOW (ref 35.0–47.0)
Hemoglobin: 8 g/dL — ABNORMAL LOW (ref 12.0–16.0)
Lymphocytes Relative: 32 % (ref 12–46)
Lymphs Abs: 1.5 10*3/uL (ref 0.7–4.0)
MCH: 27.8 pg (ref 26.0–34.0)
MCHC: 32.3 g/dL (ref 32.0–36.0)
MCV: 86 fL (ref 80.0–100.0)
Metamyelocytes Relative: 0 %
Monocytes Absolute: 0.3 10*3/uL (ref 0.1–1.0)
Monocytes Relative: 6 % (ref 3–12)
Myelocytes: 0 %
Neutro Abs: 2.8 10*3/uL (ref 1.7–7.7)
Neutrophils Relative %: 58 % (ref 43–77)
Other: 0 %
Platelets: 190 10*3/uL (ref 150–440)
Promyelocytes Absolute: 0 %
RBC: 2.86 MIL/uL — ABNORMAL LOW (ref 3.80–5.20)
RDW: 19.9 % — ABNORMAL HIGH (ref 11.5–14.5)
Smear Review: ADEQUATE
WBC: 4.8 10*3/uL (ref 3.6–11.0)
nRBC: 0 /100 WBC

## 2015-04-01 LAB — COMPREHENSIVE METABOLIC PANEL
ALT: 12 U/L — ABNORMAL LOW (ref 14–54)
AST: 22 U/L (ref 15–41)
Albumin: 3 g/dL — ABNORMAL LOW (ref 3.5–5.0)
Alkaline Phosphatase: 111 U/L (ref 38–126)
Anion gap: 12 (ref 5–15)
BUN: 14 mg/dL (ref 6–20)
CO2: 30 mmol/L (ref 22–32)
Calcium: 8.7 mg/dL — ABNORMAL LOW (ref 8.9–10.3)
Chloride: 96 mmol/L — ABNORMAL LOW (ref 101–111)
Creatinine, Ser: 5.53 mg/dL — ABNORMAL HIGH (ref 0.44–1.00)
GFR calc Af Amer: 9 mL/min — ABNORMAL LOW (ref >60)
GFR calc non Af Amer: 8 mL/min — ABNORMAL LOW (ref >60)
Glucose, Bld: 99 mg/dL (ref 65–99)
Potassium: 3.1 mmol/L — ABNORMAL LOW (ref 3.5–5.1)
Sodium: 138 mmol/L (ref 135–145)
Total Bilirubin: 0.8 mg/dL (ref 0.3–1.2)
Total Protein: 6.8 g/dL (ref 6.5–8.1)

## 2015-04-01 LAB — LIPASE, BLOOD: Lipase: 44 U/L (ref 22–51)

## 2015-04-01 LAB — GLUCOSE, CAPILLARY: Glucose-Capillary: 79 mg/dL (ref 65–99)

## 2015-04-01 LAB — TROPONIN I: Troponin I: <0.03 ng/mL (ref <0.031)

## 2015-04-01 MED ORDER — ONDANSETRON HCL 4 MG PO TABS
4.0000 mg | ORAL_TABLET | Freq: Every day | ORAL | Status: DC | PRN
  Administered 2015-04-03: 4 mg via ORAL
  Filled 2015-04-01: qty 1

## 2015-04-01 MED ORDER — RANOLAZINE ER 500 MG PO TB12
1000.0000 mg | ORAL_TABLET | Freq: Two times a day (BID) | ORAL | Status: DC
  Administered 2015-04-02 – 2015-04-05 (×7): 1000 mg via ORAL
  Filled 2015-04-01 (×7): qty 2

## 2015-04-01 MED ORDER — FAMOTIDINE 20 MG PO TABS
20.0000 mg | ORAL_TABLET | Freq: Every day | ORAL | Status: DC
  Administered 2015-04-02 – 2015-04-05 (×4): 20 mg via ORAL
  Filled 2015-04-01 (×4): qty 1

## 2015-04-01 MED ORDER — PROMETHAZINE HCL 25 MG PO TABS
25.0000 mg | ORAL_TABLET | Freq: Four times a day (QID) | ORAL | Status: DC | PRN
Start: 2015-04-01 — End: 2015-04-01

## 2015-04-01 MED ORDER — ALPRAZOLAM 0.25 MG PO TABS
0.2500 mg | ORAL_TABLET | Freq: Two times a day (BID) | ORAL | Status: DC | PRN
Start: 2015-04-01 — End: 2015-04-05
  Filled 2015-04-01 (×2): qty 1

## 2015-04-01 MED ORDER — ACETAMINOPHEN 650 MG RE SUPP: 650.0000 mg | Freq: Four times a day (QID) | RECTAL | Status: DC | PRN

## 2015-04-01 MED ORDER — AMLODIPINE BESYLATE 10 MG PO TABS
10.0000 mg | ORAL_TABLET | Freq: Every day | ORAL | Status: DC
  Administered 2015-04-04 – 2015-04-05 (×2): 10 mg via ORAL
  Filled 2015-04-01 (×2): qty 1

## 2015-04-01 MED ORDER — PANCRELIPASE (LIP-PROT-AMYL) 12000-38000 UNITS PO CPEP
48000.0000 [IU] | ORAL_CAPSULE | Freq: Three times a day (TID) | ORAL | Status: DC
  Administered 2015-04-02 – 2015-04-05 (×6): 48000 [IU] via ORAL
  Filled 2015-04-01 (×9): qty 4

## 2015-04-01 MED ORDER — CLOPIDOGREL BISULFATE 75 MG PO TABS
75.0000 mg | ORAL_TABLET | Freq: Every day | ORAL | Status: DC
  Administered 2015-04-02 – 2015-04-05 (×4): 75 mg via ORAL
  Filled 2015-04-01 (×4): qty 1

## 2015-04-01 MED ORDER — ASPIRIN EC 81 MG PO TBEC
81.0000 mg | DELAYED_RELEASE_TABLET | Freq: Every day | ORAL | Status: DC
  Administered 2015-04-02 – 2015-04-05 (×4): 81 mg via ORAL
  Filled 2015-04-01 (×4): qty 1

## 2015-04-01 MED ORDER — GABAPENTIN 100 MG PO CAPS
100.0000 mg | ORAL_CAPSULE | Freq: Every day | ORAL | Status: DC
  Administered 2015-04-02 – 2015-04-04 (×3): 100 mg via ORAL
  Filled 2015-04-01 (×4): qty 1

## 2015-04-01 MED ORDER — SUCRALFATE 1 G PO TABS
1.0000 g | ORAL_TABLET | Freq: Three times a day (TID) | ORAL | Status: DC
  Administered 2015-04-02 – 2015-04-05 (×6): 1 g via ORAL
  Filled 2015-04-01 (×8): qty 1

## 2015-04-01 MED ORDER — CARVEDILOL 25 MG PO TABS
25.0000 mg | ORAL_TABLET | Freq: Two times a day (BID) | ORAL | Status: DC
  Administered 2015-04-02 – 2015-04-05 (×5): 25 mg via ORAL
  Filled 2015-04-01 (×6): qty 1

## 2015-04-01 MED ORDER — LORATADINE 10 MG PO TABS
10.0000 mg | ORAL_TABLET | Freq: Every day | ORAL | Status: DC
  Administered 2015-04-02 – 2015-04-05 (×4): 10 mg via ORAL
  Filled 2015-04-01 (×4): qty 1

## 2015-04-01 MED ORDER — PANCRELIPASE (LIP-PROT-AMYL) 12000-38000 UNITS PO CPEP
24000.0000 [IU] | ORAL_CAPSULE | Freq: Two times a day (BID) | ORAL | Status: DC
  Administered 2015-04-02 – 2015-04-05 (×5): 24000 [IU] via ORAL
  Filled 2015-04-01 (×5): qty 2

## 2015-04-01 MED ORDER — HYDROCODONE-ACETAMINOPHEN 5-325 MG PO TABS
1.0000 | ORAL_TABLET | Freq: Four times a day (QID) | ORAL | Status: DC | PRN
  Administered 2015-04-01 – 2015-04-04 (×4): 1 via ORAL
  Filled 2015-04-01 (×4): qty 1

## 2015-04-01 MED ORDER — NITROGLYCERIN 0.4 MG SL SUBL: 0.4000 mg | SUBLINGUAL_TABLET | SUBLINGUAL | Status: DC | PRN

## 2015-04-01 MED ORDER — ATORVASTATIN CALCIUM 20 MG PO TABS
40.0000 mg | ORAL_TABLET | Freq: Every day | ORAL | Status: DC
  Administered 2015-04-02 – 2015-04-04 (×3): 40 mg via ORAL
  Filled 2015-04-01 (×4): qty 2

## 2015-04-01 MED ORDER — CHOLECALCIFEROL 10 MCG (400 UNIT) PO TABS
800.0000 [IU] | ORAL_TABLET | Freq: Every day | ORAL | Status: DC
  Administered 2015-04-02 – 2015-04-05 (×4): 800 [IU] via ORAL
  Filled 2015-04-01 (×4): qty 2

## 2015-04-01 MED ORDER — ONDANSETRON HCL 4 MG/2ML IJ SOLN
4.0000 mg | Freq: Three times a day (TID) | INTRAMUSCULAR | Status: DC | PRN
  Administered 2015-04-01 – 2015-04-05 (×7): 4 mg via INTRAVENOUS
  Filled 2015-04-01 (×8): qty 2

## 2015-04-01 MED ORDER — PROMETHAZINE HCL 25 MG/ML IJ SOLN
12.5000 mg | Freq: Four times a day (QID) | INTRAMUSCULAR | Status: DC | PRN
  Administered 2015-04-01: 12.5 mg via INTRAVENOUS
  Filled 2015-04-01: qty 1

## 2015-04-01 MED ORDER — ALPRAZOLAM 0.25 MG PO TABS
0.2500 mg | ORAL_TABLET | Freq: Two times a day (BID) | ORAL | Status: DC | PRN
  Administered 2015-04-02: 0.25 mg via ORAL

## 2015-04-01 MED ORDER — OMEGA-3-ACID ETHYL ESTERS 1 G PO CAPS
2.0000 g | ORAL_CAPSULE | Freq: Every day | ORAL | Status: DC
  Administered 2015-04-02 – 2015-04-05 (×4): 2 g via ORAL
  Filled 2015-04-01 (×4): qty 2

## 2015-04-01 MED ORDER — ENOXAPARIN SODIUM 40 MG/0.4ML ~~LOC~~ SOLN
40.0000 mg | SUBCUTANEOUS | Status: DC
  Administered 2015-04-01: 40 mg via SUBCUTANEOUS
  Filled 2015-04-01 (×2): qty 0.4

## 2015-04-01 MED ORDER — ACETAMINOPHEN 325 MG PO TABS: 650.0000 mg | ORAL_TABLET | Freq: Four times a day (QID) | ORAL | Status: DC | PRN

## 2015-04-01 MED ORDER — DIAZEPAM 5 MG/ML IJ SOLN
2.5000 mg | Freq: Once | INTRAMUSCULAR | Status: AC
  Administered 2015-04-01: 2.5 mg via INTRAVENOUS

## 2015-04-01 MED ORDER — METOCLOPRAMIDE HCL 5 MG PO TABS
5.0000 mg | ORAL_TABLET | Freq: Three times a day (TID) | ORAL | Status: DC
  Administered 2015-04-02 – 2015-04-04 (×8): 5 mg via ORAL
  Filled 2015-04-01 (×9): qty 1

## 2015-04-01 MED ORDER — INSULIN DETEMIR 100 UNIT/ML ~~LOC~~ SOLN
8.0000 [IU] | Freq: Every day | SUBCUTANEOUS | Status: DC
  Administered 2015-04-01: 8 [IU] via SUBCUTANEOUS
  Filled 2015-04-01 (×2): qty 0.08

## 2015-04-01 MED ORDER — PANCRELIPASE (LIP-PROT-AMYL) 12000-38000 UNITS PO CPEP: 24000.0000 [IU] | ORAL_CAPSULE | Freq: Every day | ORAL | Status: DC

## 2015-04-01 MED ORDER — DIAZEPAM 5 MG/ML IJ SOLN
INTRAMUSCULAR | Status: AC
  Filled 2015-04-01: qty 2

## 2015-04-01 MED ORDER — CITALOPRAM HYDROBROMIDE 20 MG PO TABS
20.0000 mg | ORAL_TABLET | Freq: Every day | ORAL | Status: DC
  Administered 2015-04-02 – 2015-04-04 (×3): 20 mg via ORAL
  Filled 2015-04-01 (×4): qty 1

## 2015-04-01 MED ORDER — ALBUTEROL SULFATE (2.5 MG/3ML) 0.083% IN NEBU
3.0000 mL | INHALATION_SOLUTION | Freq: Four times a day (QID) | RESPIRATORY_TRACT | Status: DC
  Administered 2015-04-01 – 2015-04-05 (×12): 3 mL via RESPIRATORY_TRACT
  Filled 2015-04-01 (×15): qty 3

## 2015-04-01 MED ORDER — BUPROPION HCL ER (SR) 150 MG PO TB12
150.0000 mg | ORAL_TABLET | Freq: Two times a day (BID) | ORAL | Status: DC
  Administered 2015-04-02 – 2015-04-04 (×5): 150 mg via ORAL
  Filled 2015-04-01 (×6): qty 1

## 2015-04-01 MED ORDER — INSULIN ASPART 100 UNIT/ML ~~LOC~~ SOLN: 2.0000 [IU] | Freq: Three times a day (TID) | SUBCUTANEOUS | Status: DC

## 2015-04-01 MED ORDER — SEVELAMER CARBONATE 800 MG PO TABS
800.0000 mg | ORAL_TABLET | Freq: Three times a day (TID) | ORAL | Status: DC
  Administered 2015-04-02: 800 mg via ORAL
  Filled 2015-04-01 (×5): qty 1

## 2015-04-01 MED ORDER — HYDRALAZINE HCL 50 MG PO TABS
100.0000 mg | ORAL_TABLET | Freq: Three times a day (TID) | ORAL | Status: DC
  Administered 2015-04-02 – 2015-04-05 (×6): 100 mg via ORAL
  Filled 2015-04-01 (×8): qty 2

## 2015-04-01 MED ORDER — SODIUM CHLORIDE 0.9 % IJ SOLN
3.0000 mL | Freq: Two times a day (BID) | INTRAMUSCULAR | Status: DC
  Administered 2015-04-01 – 2015-04-03 (×4): 3 mL via INTRAVENOUS

## 2015-04-01 NOTE — H&P (Signed)
[name] NAME: [name]    MR#:  [id number]  DATE OF BIRTH:  Nov 12, 1960  DATE OF ADMISSION:  04/01/2015  PRIMARY CARE PHYSICIAN: Ellamae Sia, MD   REQUESTING/REFERRING PHYSICIAN: Dr. Corky Downs  CHIEF COMPLAINT: Shortness of breath and generalized body pains    Chief Complaint  Patient presents with  . Abdominal Pain  . Joint Pain  . Headache  . Nausea    HISTORY OF PRESENT ILLNESS:  See Omalley  is a 54 y.o. female with a known history of ESRD on hemodialysis Monday, Wednesday, Friday comes in because of generalized body pains, shortness of breath. Patient had an complete dialysis on Friday usually one and half hour of dialysis. Patient could not tolerate  dialysis on Friday secondary to nausea vomiting. She comes today with generalized body pains and shortness of breath. O2 saturations dropped to 86% on room air and went up to 93-94% on 2 L. Concerning this shortness of breath and hypoxia patient is to stay inpatient for hemodialysis tomorrow morning. Denies any chest pain, cough. Has been having low-grade temperatures since yesterday and generalized body pains. No diarrhea.  PAST MEDICAL HISTORY:   Past Medical History  Diagnosis Date  . Asthma   . Collagen vascular disease   . Diabetes mellitus without complication   . Hypertension   . Coronary artery disease     a. cath 2013: stenting to RCA (report not available); b. cath 2014: LM nl, pLAD 40%, mLAD nl, ost LCx 40%, mid LCx nl, pRCA 30% @ site of prior stent, mRCA 50%  . Diabetic neuropathy   . ESRD (end stage renal disease) on dialysis     M-W-F  . GERD (gastroesophageal reflux disease)   . Hx of pancreatitis 2015  . Myocardial infarction   . Pneumonia   . Mitral regurgitation     a. echo 10/2013: EF 62%, noWMA, mildly dilated LA, mild to mod MR/TR, GR1DD  . COPD (chronic obstructive pulmonary disease)     PAST SURGICAL HISTOIRY:   Past Surgical  History  Procedure Laterality Date  . Cholecystectomy    . Appendectomy    . Abdominal hysterectomy    . Dialysis fistula creation Left     upper arm  . Esophagogastroduodenoscopy N/A 03/08/2015    Procedure: ESOPHAGOGASTRODUODENOSCOPY (EGD);  Surgeon: Manya Silvas, MD;  Location: [facility] ENDOSCOPY;  Service: Endoscopy;  Laterality: N/A;    SOCIAL HISTORY:   History  Substance Use Topics  . Smoking status: Current Some Day Smoker -- 0.50 packs/day    Types: Cigarettes    Last Attempt to Quit: 02/13/2015  . Smokeless tobacco: Never Used  . Alcohol Use: No    FAMILY HISTORY:   Family History  Problem Relation Age of Onset  . Kidney disease Mother   . Diabetes Mother     DRUG ALLERGIES:   Allergies  Allergen Reactions  . Ace Inhibitors Swelling  . Codeine Nausea And Vomiting  . Compazine [Prochlorperazine Edisylate] Nausea And Vomiting  . Losartan Other (See Comments)  . Phenergan [Promethazine Hcl] Other (See Comments)    Hallucinations  . Zofran [Ondansetron Hcl] Other (See Comments)    Dementia sx  . Oxycodone Anxiety  . Tape Rash    REVIEW OF SYSTEMS:  CONSTITUTIONAL: No fever, fatigue or weakness.  EYES: No blurred or double vision.  EARS, NOSE, AND THROAT: No tinnitus or ear pain.  RESPIRATORY: Shortness of breath since today  morning. CARDIOVASCULAR: No chest pain, orthopnea, edema.  GASTROINTESTINAL: No nausea, vomiting, diarrhea or abdominal pain.  GENITOURINARY: No dysuria, hematuria.  ENDOCRINE: No polyuria, nocturia,  HEMATOLOGY: No anemia, easy bruising or bleeding SKIN: No rash or lesion. MUSCULOSKELETAL: No joint pain or arthritis.   NEUROLOGIC: No tingling, numbness, weakness.  PSYCHIATRY: No anxiety or depression.   MEDICATIONS AT HOME:   Prior to Admission medications   Medication Sig Start Date End Date Taking? Authorizing Provider  albuterol (PROVENTIL HFA;VENTOLIN HFA) 108 (90 BASE) MCG/ACT inhaler Inhale 4-6 puffs by mouth every 4  hours as needed for wheezing, cough, and/or shortness of breath 02/17/15   Hinda Kehr, MD  ALPRAZolam Duanne Moron) 0.25 MG tablet Take 1 tablet (0.25 mg total) by mouth 2 (two) times daily as needed for anxiety. 03/14/15   Gonzella Lex, MD  ALPRAZolam Duanne Moron) 0.25 MG tablet Take 1 tablet (0.25 mg total) by mouth 2 (two) times daily as needed for anxiety. 03/14/15   Gonzella Lex, MD  amLODipine (NORVASC) 10 MG tablet Take 10 mg by mouth daily.    Historical Provider, MD  aspirin EC 81 MG tablet Take 81 mg by mouth daily.    Historical Provider, MD  atorvastatin (LIPITOR) 40 MG tablet Take 40 mg by mouth daily.    Historical Provider, MD  buPROPion (WELLBUTRIN SR) 150 MG 12 hr tablet Take 150 mg by mouth 2 (two) times daily.    Historical Provider, MD  carvedilol (COREG) 25 MG tablet Take 25 mg by mouth 2 (two) times daily.    Historical Provider, MD  cetirizine (ZYRTEC) 10 MG tablet Take 10 mg by mouth daily.    Historical Provider, MD  citalopram (CELEXA) 20 MG tablet Take 20 mg by mouth daily.    Historical Provider, MD  clopidogrel (PLAVIX) 75 MG tablet Take 75 mg by mouth daily.    Historical Provider, MD  gabapentin (NEURONTIN) 100 MG capsule Take 100 mg by mouth daily.    Historical Provider, MD  hydrALAZINE (APRESOLINE) 100 MG tablet Take 1 tablet (100 mg total) by mouth 3 (three) times daily. 03/09/15   Henreitta Leber, MD  HYDROcodone-acetaminophen (NORCO/VICODIN) 5-325 MG per tablet Take 1 tablet by mouth every 6 (six) hours as needed for moderate pain. 02/08/15   Earleen Newport, MD  hydrocortisone 1 % ointment Apply 1 application topically 2 (two) times daily.    Historical Provider, MD  insulin aspart (NOVOLOG) 100 UNIT/ML injection Inject 2-6 Units into the skin 3 (three) times daily with meals. Pt uses as needed per sliding scale.    Historical Provider, MD  insulin detemir (LEVEMIR) 100 UNIT/ML injection Inject 8 Units into the skin at bedtime.    Historical Provider, MD   lipase/protease/amylase (CREON) 12000 UNITS CPEP capsule Take 24,000-48,000 Units by mouth 5 (five) times daily. Pt takes four capsules with meals and two capsules with snacks.    Historical Provider, MD  metoCLOPramide (REGLAN) 5 MG tablet Take 1 tablet (5 mg total) by mouth 4 (four) times daily -  before meals and at bedtime. 02/24/15   [name], MD  nitroGLYCERIN (NITROSTAT) 0.4 MG SL tablet Place 0.4 mg under the tongue every 5 (five) minutes as needed for chest pain.    Historical Provider, MD  Omega-3 Fatty Acids (FISH OIL) 1000 MG CAPS Take 1 capsule by mouth 2 (two) times daily.    Historical Provider, MD  ondansetron (ZOFRAN) 4 MG tablet Take 1 tablet (4 mg total) by mouth daily  as needed for nausea or vomiting. 02/08/15   Earleen Newport, MD  pantoprazole (PROTONIX) 40 MG tablet Take 1 tablet (40 mg total) by mouth 2 (two) times daily before a meal. 02/24/15   [name], MD  promethazine (PHENERGAN) 25 MG tablet Take 1 tablet (25 mg total) by mouth every 6 (six) hours as needed for nausea or vomiting. 03/30/15   Harvest Dark, MD  ranitidine (ZANTAC) 150 MG tablet Take 150 mg by mouth 2 (two) times daily.    Historical Provider, MD  ranolazine (RANEXA) 1000 MG SR tablet Take 1,000 mg by mouth 2 (two) times daily.     Historical Provider, MD  sevelamer carbonate (RENVELA) 800 MG tablet Take 800 mg by mouth 3 (three) times daily with meals.    Historical Provider, MD  sucralfate (CARAFATE) 1 G tablet Take 1 tablet (1 g total) by mouth 4 (four) times daily -  with meals and at bedtime. 02/24/15   [name], MD  Vitamin D, Cholecalciferol, 400 UNITS TABS Take 2 tablets by mouth daily.    Historical Provider, MD      VITAL SIGNS:  Blood pressure 167/68, pulse 70, temperature 97.7 F (36.5 C), temperature source Oral, resp. rate 20, height 5\' 5"  (1.651 m), weight 91.173 kg (201 lb), SpO2 95 %.  PHYSICAL EXAMINATION:  GENERAL:  54 y.o.-year-old patient lying in the bed with no  acute distress.  EYES: Pupils equal, round, reactive to light and accommodation. No scleral icterus. Extraocular muscles intact.  HEENT: Head atraumatic, normocephalic. Oropharynx and nasopharynx clear.  NECK:  Supple, no jugular venous distention. No thyroid enlargement, no tenderness.  LUNGS: Bilateral basilar crepitations present  No use of accessory muscles of respiration.  CARDIOVASCULAR: S1, S2 normal. No murmurs, rubs, or gallops.  ABDOMEN: Soft, nontender, nondistended. Bowel sounds present. No organomegaly or mass.  EXTREMITIES: No pedal edema, cyanosis, or clubbing.  NEUROLOGIC: Cranial nerves II through XII are intact. Muscle strength 5/5 in all extremities. Sensation intact. Gait not checked.  PSYCHIATRIC: The patient is alert and oriented x 3.  SKIN: No obvious rash, lesion, or ulcer.   LABORATORY PANEL:   CBC  Recent Labs Lab 04/01/15 1218  WBC 4.8  HGB 8.0*  HCT 24.6*  PLT 190   ------------------------------------------------------------------------------------------------------------------  Chemistries   Recent Labs Lab 04/01/15 1218  NA 138  K 3.1*  CL 96*  CO2 30  GLUCOSE 99  BUN 14  CREATININE 5.53*  CALCIUM 8.7*  AST 22  ALT 12*  ALKPHOS 111  BILITOT 0.8   ------------------------------------------------------------------------------------------------------------------  Cardiac Enzymes  Recent Labs Lab 04/01/15 1218  TROPONINI <0.03   ------------------------------------------------------------------------------------------------------------------  RADIOLOGY:  Dg Chest Portable 1 View  04/01/2015   CLINICAL DATA:  Hypoxia and shortness of breath.  EXAM: PORTABLE CHEST - 1 VIEW  COMPARISON:  03/14/2015 and prior radiographs  FINDINGS: Cardiomegaly is identified.  Small-moderate bilateral pleural effusions and bibasilar atelectasis noted.  Pulmonary vascular congestion is present.  There is no evidence of pneumothorax.  Vascular stents  overlying the left subclavian axillary region and right arm noted.  IMPRESSION: Cardiomegaly with pulmonary vascular congestion, small -moderate bilateral pleural effusions and bibasilar atelectasis.   Electronically Signed   By: Margarette Canada M.D.   On: 04/01/2015 14:00    EKG:   Orders placed or performed during the hospital encounter of 04/01/15  . ED EKG   (only if pt is 56 y.o. or older and pain is above umbilicus)  . ED EKG   (  only if pt is 54 y.o. or older and pain is above umbilicus)    IMPRESSION AND PLAN:   1. Hypoxia with pulmonary vascular congestion on chest x-ray admitted to telemetry for pulmonary edema in the context of ESRD: Patient continued to be on oxygen, consult nephrology hemodialysis needs. Patient gets Monday Wednesday Friday and the she did not finish dialysis on Friday and she is Here because of shortness of breath and hypoxia. Other labs look fine. No yperkalemia BP is normal so no urgent indication for dialysis patient can have hematemesis tomorrow. Consulted nephrology.  #2: Chronic pancreatitis, dyspepsia continue home medications #3 diabetes mellitus type 2 with diabetic gastroparesis. Continue NovoLog flex pen sliding scale coverage, Levemir, Zofran, Reglan. #4: ESRD and hemodialysis on Monday, Wednesday, Friday. #5: History of coronary artery disease status post a stent in RCA in 2013 follows up with Dr. Neoma Laming, patient is on aspirin, Plavix, statins, Ranexa. Continue them. 6;generalized body pains likely secondary to viral syndrome. Patient has no evidence of pneumonia,  white count is normal.  All the records are reviewed and case discussed with ED provider. Management plans discussed with the patient, family and they are in agreement.  CODE STATUS: full  TOTAL TIME TAKING CARE OF THIS PATIENT: 55 minutes.    Epifanio Lesches M.D on 04/01/2015 at 4:04 PM  Between 7am to 6pm - Pager - [phone]  After 6pm go to [email or web address] - password [email or web address]  [facility]  Office  [phone]  CC: Primary care physician; Ellamae Sia, MD

## 2015-04-01 NOTE — ED Provider Notes (Signed)
[facility] Emergency Department Provider Note  ____________________________________________  Time seen: On arrival  I have reviewed the triage vital signs and the nursing notes.   HISTORY  Chief Complaint Joint pain and shortness of breath   HPI [name] is a 54 y.o. female with history of end-stage renal disease who presents with complaints of mild shortness of breath and aching all over. She reports she had dialysis on Friday without difficulty. But she feels short of breath today. She denies fevers chills. She denies cough. She does report a history of pneumonia and this feels similar.     Past Medical History  Diagnosis Date  . Asthma   . Collagen vascular disease   . Diabetes mellitus without complication   . Hypertension   . Coronary artery disease     a. cath 2013: stenting to RCA (report not available); b. cath 2014: LM nl, pLAD 40%, mLAD nl, ost LCx 40%, mid LCx nl, pRCA 30% @ site of prior stent, mRCA 50%  . Diabetic neuropathy   . ESRD (end stage renal disease) on dialysis     M-W-F  . GERD (gastroesophageal reflux disease)   . Hx of pancreatitis 2015  . Myocardial infarction   . Pneumonia   . Mitral regurgitation     a. echo 10/2013: EF 62%, noWMA, mildly dilated LA, mild to mod MR/TR, GR1DD  . COPD (chronic obstructive pulmonary disease)     Patient Active Problem List   Diagnosis Date Noted  . Adjustment disorder with anxiety 03/14/2015  . Somatic symptom disorder, mild 03/08/2015  . Coronary artery disease involving native coronary artery of native heart without angina pectoris   . Nausea & vomiting 03/06/2015  . Abdominal pain 03/06/2015  . DM (diabetes mellitus) 03/06/2015  . HTN (hypertension) 03/06/2015  . Gastroparesis 02/24/2015  . Pleural effusion 02/19/2015  . HCAP (healthcare-associated pneumonia) 02/19/2015  . End-stage renal disease on hemodialysis 02/19/2015    Past Surgical History  Procedure Laterality  Date  . Cholecystectomy    . Appendectomy    . Abdominal hysterectomy    . Dialysis fistula creation Left     upper arm  . Esophagogastroduodenoscopy N/A 03/08/2015    Procedure: ESOPHAGOGASTRODUODENOSCOPY (EGD);  Surgeon: Manya Silvas, MD;  Location: [facility] ENDOSCOPY;  Service: Endoscopy;  Laterality: N/A;    Current Outpatient Rx  Name  Route  Sig  Dispense  Refill  . albuterol (PROVENTIL HFA;VENTOLIN HFA) 108 (90 BASE) MCG/ACT inhaler      Inhale 4-6 puffs by mouth every 4 hours as needed for wheezing, cough, and/or shortness of breath   1 Inhaler   1   . ALPRAZolam (XANAX) 0.25 MG tablet   Oral   Take 1 tablet (0.25 mg total) by mouth 2 (two) times daily as needed for anxiety.   60 tablet   0   . ALPRAZolam (XANAX) 0.25 MG tablet   Oral   Take 1 tablet (0.25 mg total) by mouth 2 (two) times daily as needed for anxiety.   60 tablet   0   . amLODipine (NORVASC) 10 MG tablet   Oral   Take 10 mg by mouth daily.         Marland Kitchen aspirin EC 81 MG tablet   Oral   Take 81 mg by mouth daily.         Marland Kitchen atorvastatin (LIPITOR) 40 MG tablet   Oral   Take 40 mg by mouth daily.         Marland Kitchen  buPROPion (WELLBUTRIN SR) 150 MG 12 hr tablet   Oral   Take 150 mg by mouth 2 (two) times daily.         . carvedilol (COREG) 25 MG tablet   Oral   Take 25 mg by mouth 2 (two) times daily.         . cetirizine (ZYRTEC) 10 MG tablet   Oral   Take 10 mg by mouth daily.         . citalopram (CELEXA) 20 MG tablet   Oral   Take 20 mg by mouth daily.         . clopidogrel (PLAVIX) 75 MG tablet   Oral   Take 75 mg by mouth daily.         Marland Kitchen gabapentin (NEURONTIN) 100 MG capsule   Oral   Take 100 mg by mouth daily.         . hydrALAZINE (APRESOLINE) 100 MG tablet   Oral   Take 1 tablet (100 mg total) by mouth 3 (three) times daily.   90 tablet   1   . HYDROcodone-acetaminophen (NORCO/VICODIN) 5-325 MG per tablet   Oral   Take 1 tablet by mouth every 6 (six) hours as  needed for moderate pain.   20 tablet   0   . hydrocortisone 1 % ointment   Topical   Apply 1 application topically 2 (two) times daily.         . insulin aspart (NOVOLOG) 100 UNIT/ML injection   Subcutaneous   Inject 2-6 Units into the skin 3 (three) times daily with meals. Pt uses as needed per sliding scale.         . insulin detemir (LEVEMIR) 100 UNIT/ML injection   Subcutaneous   Inject 8 Units into the skin at bedtime.         . lipase/protease/amylase (CREON) 12000 UNITS CPEP capsule   Oral   Take 24,000-48,000 Units by mouth 5 (five) times daily. Pt takes four capsules with meals and two capsules with snacks.         . metoCLOPramide (REGLAN) 5 MG tablet   Oral   Take 1 tablet (5 mg total) by mouth 4 (four) times daily -  before meals and at bedtime.   40 tablet   0   . nitroGLYCERIN (NITROSTAT) 0.4 MG SL tablet   Sublingual   Place 0.4 mg under the tongue every 5 (five) minutes as needed for chest pain.         . Omega-3 Fatty Acids (FISH OIL) 1000 MG CAPS   Oral   Take 1 capsule by mouth 2 (two) times daily.         . ondansetron (ZOFRAN) 4 MG tablet   Oral   Take 1 tablet (4 mg total) by mouth daily as needed for nausea or vomiting.   20 tablet   1   . pantoprazole (PROTONIX) 40 MG tablet   Oral   Take 1 tablet (40 mg total) by mouth 2 (two) times daily before a meal.   60 tablet   0   . promethazine (PHENERGAN) 25 MG tablet   Oral   Take 1 tablet (25 mg total) by mouth every 6 (six) hours as needed for nausea or vomiting.   30 tablet   0   . ranitidine (ZANTAC) 150 MG tablet   Oral   Take 150 mg by mouth 2 (two) times daily.         Marland Kitchen  ranolazine (RANEXA) 1000 MG SR tablet   Oral   Take 1,000 mg by mouth 2 (two) times daily.          . sevelamer carbonate (RENVELA) 800 MG tablet   Oral   Take 800 mg by mouth 3 (three) times daily with meals.         . sucralfate (CARAFATE) 1 G tablet   Oral   Take 1 tablet (1 g total) by  mouth 4 (four) times daily -  with meals and at bedtime.   120 tablet   0   . Vitamin D, Cholecalciferol, 400 UNITS TABS   Oral   Take 2 tablets by mouth daily.           Allergies Ace inhibitors; Codeine; Compazine; Losartan; Phenergan; Zofran; Oxycodone; and Tape  Family History  Problem Relation Age of Onset  . Kidney disease Mother   . Diabetes Mother     Social History History  Substance Use Topics  . Smoking status: Current Some Day Smoker -- 0.50 packs/day    Types: Cigarettes    Last Attempt to Quit: 02/13/2015  . Smokeless tobacco: Never Used  . Alcohol Use: No    Review of Systems  Constitutional: Negative for fever. Eyes: Negative for visual changes. ENT: Negative for sore throat Cardiovascular: Negative for chest pain. Respiratory: Positive for shortness of breath Gastrointestinal: Negative for abdominal pain, vomiting and diarrhea. Genitourinary: Negative for dysuria. Musculoskeletal: Negative for back pain. Skin: Negative for rash. Neurological: Negative for headaches or focal weakness Psychiatric: No anxiety  10-point ROS otherwise negative.  ____________________________________________   PHYSICAL EXAM:  VITAL SIGNS: ED Triage Vitals  Enc Vitals Group     BP 04/01/15 1203 137/52 mmHg     Pulse Rate 04/01/15 1203 74     Resp 04/01/15 1203 20     Temp 04/01/15 1203 97.7 F (36.5 C)     Temp Source 04/01/15 1203 Oral     SpO2 04/01/15 1201 93 %     Weight 04/01/15 1203 201 lb (91.173 kg)     Height 04/01/15 1203 5\' 5"  (1.651 m)     Head Cir --      Peak Flow --      Pain Score 04/01/15 1203 9     Pain Loc --      Pain Edu? --      Excl. in [place]? --      Constitutional: Alert and oriented. Well appearing and in no distress. Eyes: Conjunctivae are normal.  ENT   Head: Normocephalic and atraumatic.   Mouth/Throat: Mucous membranes are moist. Cardiovascular: Normal rate, regular rhythm. Normal and symmetric distal pulses are  present in all extremities. No murmurs, rubs, or gallops. Respiratory: Tachypnea, bibasilar rales noted Gastrointestinal: Soft and non-tender in all quadrants. No distention. There is no CVA tenderness. Genitourinary: deferred Musculoskeletal: Nontender with normal range of motion in all extremities. No lower extremity tenderness nor edema. Neurologic:  Normal speech and language. No gross focal neurologic deficits are appreciated. Skin:  Skin is warm, dry and intact. No rash noted. Psychiatric: Mood and affect are normal. Patient exhibits appropriate insight and judgment.  ____________________________________________    LABS (pertinent positives/negatives)  Labs Reviewed  COMPREHENSIVE METABOLIC PANEL - Abnormal; Notable for the following:    Potassium 3.1 (*)    Chloride 96 (*)    Creatinine, Ser 5.53 (*)    Calcium 8.7 (*)    Albumin 3.0 (*)    ALT 12 (*)  GFR calc non Af Amer 8 (*)    GFR calc Af Amer 9 (*)    All other components within normal limits  CBC WITH DIFFERENTIAL/PLATELET - Abnormal; Notable for the following:    RBC 2.86 (*)    Hemoglobin 8.0 (*)    HCT 24.6 (*)    RDW 19.9 (*)    All other components within normal limits  TROPONIN I  LIPASE, BLOOD    ____________________________________________   EKG  ED ECG REPORT I, Lavonia Drafts, the attending physician, personally viewed and interpreted this ECG.   Date: 04/01/2015  EKG Time: 12:08 PM  Rate: 74  Rhythm: normal sinus rhythm  Axis: Rightward axis  Intervals:Prolonged QT  ST&T Change: Nonspecific T wave changes   ____________________________________________    RADIOLOGY I have personally reviewed any xrays that were ordered on this patient: Cardiomegaly with pulmonary vascular congestion, bilateral pleural effusions  ____________________________________________   PROCEDURES  Procedure(s) performed: none  Critical Care performed:  none  ____________________________________________   INITIAL IMPRESSION / ASSESSMENT AND PLAN / ED COURSE  Pertinent labs & imaging results that were available during my care of the patient were reviewed by me and considered in my medical decision making (see chart for details).  Unclear cause of aching sensation but patient is clearly hypoxic and is requiring oxygen. She is not on oxygen at home and has no fevers chills or cough. She received dialysis on Friday  Discussed case with Dr. Candiss Norse of nephrology who recommends admission and dialysis first thing in the morning  ____________________________________________   FINAL CLINICAL IMPRESSION(S) / ED DIAGNOSES  Final diagnoses:  Hypoxia  Pulmonary vascular congestion  ESRD (end stage renal disease)     Lavonia Drafts, MD 04/01/15 1542

## 2015-04-01 NOTE — ED Notes (Signed)
Patient to ER for "all over pain". When further questioned about pain, she states pain is in "all of my joints". States she also has headache ("migraine") and abdominal pain with nausea. Was seen with abdominal pain in ER last week and discharged home. Patient states pain is worst at head and knee joints. Patient currently does hemodialysis and last had treatment on Friday.

## 2015-04-01 NOTE — ED Notes (Signed)
Patient resting in stretcher. Respirations even and unlabored. No obvious distress. Cardiac monitor in place. Pt asleep. Call bell within reach. Will continue to monitor.

## 2015-04-01 NOTE — Progress Notes (Signed)
Patient is nauseated and zofran is not helping, has phenergan listed as allergy - listed as hallucination, but patient states she takes phenergan at home without this side effect.  Will order phenergan.  Jacqulyn Bath, MD [facility] 04/01/2015, 11:03 PM

## 2015-04-01 NOTE — Progress Notes (Signed)
Skin verified by doll ferguson rn

## 2015-04-02 DIAGNOSIS — L8990 Pressure ulcer of unspecified site, unspecified stage: Secondary | ICD-10-CM

## 2015-04-02 LAB — GLUCOSE, CAPILLARY
Glucose-Capillary: 359 mg/dL — ABNORMAL HIGH (ref 65–99)
Glucose-Capillary: <10 mg/dL — CL (ref 65–99)
Glucose-Capillary: 144 mg/dL — ABNORMAL HIGH (ref 65–99)
Glucose-Capillary: 197 mg/dL — ABNORMAL HIGH (ref 65–99)
Glucose-Capillary: 41 mg/dL — CL (ref 65–99)
Glucose-Capillary: 67 mg/dL (ref 65–99)
Glucose-Capillary: 93 mg/dL (ref 65–99)
Glucose-Capillary: 97 mg/dL (ref 65–99)

## 2015-04-02 LAB — COMPREHENSIVE METABOLIC PANEL
ALT: 13 U/L — ABNORMAL LOW (ref 14–54)
AST: 19 U/L (ref 15–41)
Albumin: 2.9 g/dL — ABNORMAL LOW (ref 3.5–5.0)
Alkaline Phosphatase: 119 U/L (ref 38–126)
Anion gap: 8 (ref 5–15)
BUN: 16 mg/dL (ref 6–20)
CO2: 32 mmol/L (ref 22–32)
Calcium: 8.7 mg/dL — ABNORMAL LOW (ref 8.9–10.3)
Chloride: 100 mmol/L — ABNORMAL LOW (ref 101–111)
Creatinine, Ser: 6.11 mg/dL — ABNORMAL HIGH (ref 0.44–1.00)
GFR calc Af Amer: 8 mL/min — ABNORMAL LOW (ref >60)
GFR calc non Af Amer: 7 mL/min — ABNORMAL LOW (ref >60)
Glucose, Bld: <20 mg/dL — CL (ref 65–99)
Potassium: 2.9 mmol/L — CL (ref 3.5–5.1)
Sodium: 140 mmol/L (ref 135–145)
Total Bilirubin: 0.5 mg/dL (ref 0.3–1.2)
Total Protein: 6.6 g/dL (ref 6.5–8.1)

## 2015-04-02 LAB — MAGNESIUM: Magnesium: 2.1 mg/dL (ref 1.7–2.4)

## 2015-04-02 MED ORDER — DEXTROSE 50 % IV SOLN
50.0000 mL | Freq: Once | INTRAVENOUS | Status: AC
  Administered 2015-04-02: 50 mL via INTRAVENOUS
  Filled 2015-04-02: qty 50

## 2015-04-02 MED ORDER — DEXTROSE 50 % IV SOLN
INTRAVENOUS | Status: AC
  Administered 2015-04-02: 07:00:00
  Filled 2015-04-02: qty 50

## 2015-04-02 MED ORDER — SENNA 8.6 MG PO TABS: 1.0000 | ORAL_TABLET | Freq: Every day | ORAL | Status: DC | PRN

## 2015-04-02 MED ORDER — EPOETIN ALFA 10000 UNIT/ML IJ SOLN
4000.0000 [IU] | INTRAMUSCULAR | Status: DC
  Administered 2015-04-02 – 2015-04-04 (×2): 4000 [IU] via SUBCUTANEOUS

## 2015-04-02 MED ORDER — DOCUSATE SODIUM 100 MG PO CAPS
100.0000 mg | ORAL_CAPSULE | Freq: Two times a day (BID) | ORAL | Status: DC
  Administered 2015-04-02 – 2015-04-05 (×5): 100 mg via ORAL
  Filled 2015-04-02 (×5): qty 1

## 2015-04-02 MED ORDER — POTASSIUM CHLORIDE CRYS ER 20 MEQ PO TBCR
40.0000 meq | EXTENDED_RELEASE_TABLET | ORAL | Status: AC
  Administered 2015-04-02: 40 meq via ORAL
  Filled 2015-04-02 (×3): qty 2

## 2015-04-02 MED ORDER — PANTOPRAZOLE SODIUM 40 MG PO TBEC
40.0000 mg | DELAYED_RELEASE_TABLET | Freq: Every day | ORAL | Status: DC
  Administered 2015-04-02 – 2015-04-05 (×4): 40 mg via ORAL
  Filled 2015-04-02 (×4): qty 1

## 2015-04-02 MED ORDER — HEPARIN SODIUM (PORCINE) 5000 UNIT/ML IJ SOLN
5000.0000 [IU] | Freq: Two times a day (BID) | INTRAMUSCULAR | Status: DC
Start: 2015-04-02 — End: 2015-04-05
  Administered 2015-04-02 – 2015-04-05 (×7): 5000 [IU] via SUBCUTANEOUS
  Filled 2015-04-02 (×7): qty 1

## 2015-04-02 NOTE — Progress Notes (Signed)
Status: Rapid Response, reaction to meds Update: Chaplain offered silent prayers from doorway as clinicians worked to help regain or verify the patient's coherence  Chaplains and pastoral care can be reached via pager [phone] or by submitting a request online

## 2015-04-02 NOTE — Progress Notes (Signed)
Notified by lab pt blood sugar below 20. Took finger stick for testing. Blood sugar below 10 unreadable. MD notified. Rapid response inttiated. Given one amps of D5W. Attempted to stimulate. Unsuccessful. Given another amp of D5W.  CBG rechecked. Sugar 359. MD notified. House supervisor arrived. Pt assessed. Attempted to stimulate multiple times. Pt became more alert. MD arrived and assessed. Pt able to respond appropriately. Pt states" Something is wrong with me. Help me" MD notified.

## 2015-04-02 NOTE — Progress Notes (Signed)
RN called stating patient was noted to be unresponsive and her blood sugars were undetectable. Patient received 2 ampules of dextrose IV and started regaining consciousness. Went and saw the patient since nurse felt patient not alert or oriented. Went and saw the patient. Patient is alert and oriented 3, vital signs stable, blood sugars 359, 189. Moving all 4 limbs well, no facial asymmetry. Patient denies any complaints. Advised nurse to monitor patient closely and call back as needed.

## 2015-04-02 NOTE — Progress Notes (Signed)
[facility] [place] NAME: [name]    MR#:  [id number]  DATE OF BIRTH:  05-Jul-1961  SUBJECTIVE:  CHIEF COMPLAINT:   Chief Complaint  Patient presents with  . Abdominal Pain  . Joint Pain  . Headache  . Nausea   Patient is 54 year old female with history of end-stage renal disease who underwent hemodialysis on Friday, 2 days ago, presents to the hospital with nausea, vomiting, abdominal pain as well as hypoxia. Her chest x-ray in the emergency room revealed cardiomegaly, pulmonary vascular congestion and a small to moderate bilateral pleural effusions. Patient still feels somewhat nauseated but doesn't have any significant abdominal pains at present. Glucose level was found to be as low as 10 earlier today, patient was given dextrose after which patient's blood glucose level improved to 144.   Review of Systems  Constitutional: Negative for fever, chills and weight loss.  HENT: Negative for congestion.   Eyes: Negative for blurred vision and double vision.  Respiratory: Positive for shortness of breath. Negative for cough, sputum production and wheezing.   Cardiovascular: Negative for chest pain, palpitations, orthopnea, leg swelling and PND.  Gastrointestinal: Positive for nausea and abdominal pain. Negative for vomiting, diarrhea, constipation and blood in stool.  Genitourinary: Negative for dysuria, urgency, frequency and hematuria.  Musculoskeletal: Negative for falls.  Neurological: Negative for dizziness, tremors, focal weakness and headaches.  Endo/Heme/Allergies: Does not bruise/bleed easily.  Psychiatric/Behavioral: Negative for depression. The patient does not have insomnia.     VITAL SIGNS: Blood pressure 139/69, pulse 61, temperature 98 F (36.7 C), temperature source Oral, resp. rate 18, height 5\' 5"  (1.651 m), weight 90.1 kg (198 lb 10.2 oz), SpO2 100 %.  PHYSICAL EXAMINATION:   GENERAL:  54 y.o.-year-old patient  lying in the bed with no acute distress. Dry oral mucosa EYES: Pupils equal, round, reactive to light and accommodation. No scleral icterus. Extraocular muscles intact.  HEENT: Head atraumatic, normocephalic. Oropharynx and nasopharynx clear.  NECK:  Supple, no jugular venous distention. No thyroid enlargement, no tenderness.  LUNGS: Some diminished breath sounds bilaterally, no wheezing, few rales,rhonchi , no crepitation. No use of accessory muscles of respiration.  CARDIOVASCULAR: S1, S2 normal. No murmurs, rubs, or gallops.  ABDOMEN: Soft, nontender, nondistended. Bowel sounds present. No organomegaly or mass.  EXTREMITIES: No pedal edema, cyanosis, or clubbing.  NEUROLOGIC: Cranial nerves II through XII are intact. Muscle strength 5/5 in all extremities. Sensation intact. Gait not checked.  PSYCHIATRIC: The patient is alert and oriented x 3.  SKIN: No obvious rash, lesion, or ulcer.   ORDERS/RESULTS REVIEWED:   CBC  Recent Labs Lab 03/30/15 0846 04/01/15 1218  WBC 6.6 4.8  HGB 8.0* 8.0*  HCT 24.4* 24.6*  PLT 169 190  MCV 84.9 86.0  MCH 27.8 27.8  MCHC 32.7 32.3  RDW 19.9* 19.9*  LYMPHSABS 2.2 1.5  MONOABS 0.6 0.3  EOSABS 0.3 0.2  BASOSABS 0.0 0.0   ------------------------------------------------------------------------------------------------------------------  Chemistries   Recent Labs Lab 03/30/15 0846 04/01/15 1218 04/02/15 0444  NA 136 138 140  K 2.8* 3.1* 2.9*  CL 98* 96* 100*  CO2 31 30 32  GLUCOSE 89 99 <20*  BUN 9 14 16   CREATININE 2.94* 5.53* 6.11*  CALCIUM 8.1* 8.7* 8.7*  AST 20 22 19   ALT 12* 12* 13*  ALKPHOS 104 111 119  BILITOT 0.6 0.8 0.5   ------------------------------------------------------------------------------------------------------------------ estimated creatinine clearance is 11.5 mL/min (by C-G formula based on Cr of  6.11). ------------------------------------------------------------------------------------------------------------------ No results for input(s): TSH, T4TOTAL, T3FREE, THYROIDAB in the last 72 hours.  Invalid input(s): FREET3  Cardiac Enzymes  Recent Labs Lab 04/01/15 1218  TROPONINI <0.03   ------------------------------------------------------------------------------------------------------------------ Invalid input(s): POCBNP ---------------------------------------------------------------------------------------------------------------  RADIOLOGY: Dg Chest Portable 1 View  04/01/2015   CLINICAL DATA:  Hypoxia and shortness of breath.  EXAM: PORTABLE CHEST - 1 VIEW  COMPARISON:  03/14/2015 and prior radiographs  FINDINGS: Cardiomegaly is identified.  Small-moderate bilateral pleural effusions and bibasilar atelectasis noted.  Pulmonary vascular congestion is present.  There is no evidence of pneumothorax.  Vascular stents overlying the left subclavian axillary region and right arm noted.  IMPRESSION: Cardiomegaly with pulmonary vascular congestion, small -moderate bilateral pleural effusions and bibasilar atelectasis.   Electronically Signed   By: Margarette Canada M.D.   On: 04/01/2015 14:00    EKG:  Orders placed or performed during the hospital encounter of 04/01/15  . ED EKG   (only if pt is 56 y.o. or older and pain is above umbilicus)  . ED EKG   (only if pt is 56 y.o. or older and pain is above umbilicus)  . EKG 12-Lead  . EKG 12-Lead    ASSESSMENT AND PLAN:  Active Problems:   Acute respiratory failure with hypoxia   Pressure ulcer *. Aacute respiratory failure with hypoxia due to acute pulmonary edema, patient is admitted to the hospital hemodialysis is going to be performed today by nephrologist, weaning off oxygen as tolerated *. Nausea and vomiting, questionable acute gastritis, continue patient on PPIs and change her diet to full liquid diet, advance as  tolerated *Hypoglycemia, suspend Lantus until patient is taking good by mouth * Hypokalemia, supplement orally, follow potassium level tomorrow morning. Get magnesium level checked *. Anemia, likely anemia of chronic disease. Patient is receiving Epogen with hemodialysis.      Management plans discussed with the patient, family and they are in agreement.   DRUG ALLERGIES:  Allergies  Allergen Reactions  . Ace Inhibitors Swelling  . Codeine Nausea And Vomiting  . Compazine [Prochlorperazine Edisylate] Nausea And Vomiting  . Losartan Other (See Comments)  . Phenergan [Promethazine Hcl] Other (See Comments)    Hallucinations  . Zofran [Ondansetron Hcl] Other (See Comments)    Dementia sx  . Oxycodone Anxiety  . Tape Rash    CODE STATUS:     Code Status Orders        Start     Ordered   04/01/15 1555  Full code   Continuous     04/01/15 1558    Advance Directive Documentation        Most Recent Value   Type of Advance Directive  Living will   Pre-existing out of facility DNR order (yellow form or pink MOST form)     "MOST" Form in Place?        TOTAL TIME TAKING CARE OF THIS PATIENT: 45 minutes.   Patient's care was discussed with her daughter extensively. All questions answered, voiced understanding Weslee Fogg M.D on 04/02/2015 at 10:41 AM  Between 7am to 6pm - Pager - [phone]  After 6pm go to [email or web address] - password [email or web address] [facility]  Office  [phone]  CC: Primary care physician; Ellamae Sia, MD

## 2015-04-02 NOTE — Care Management (Signed)
Patient presents from home.  She is chronic hemodialysis.  Dialysis coordinator informed of admission.  Rapid response was called this morning due to period of unresponsiveness.  Finger Blood sugar was found to be below 10.  Serum level was 20

## 2015-04-02 NOTE — Progress Notes (Signed)
Pt resting peacefully. Repositioned in bed. Will continue to monitor.

## 2015-04-02 NOTE — Progress Notes (Signed)
Initial Nutrition Assessment    INTERVENTION:    Meals and Snacks: Cater to patient preferences, recommend advancing diet as tolerated   NUTRITION DIAGNOSIS:  Inadequate oral intake related to altered GI function as evidenced by per patient/family report.   GOAL:  Patient will meet greater than or equal to 90% of their needs   MONITOR:   (Energy Intake, Anthropometrics, Digestive System, ELectrolyte/Renal Profile)  REASON FOR ASSESSMENT:   (Renal Diet, Dialysis)    ASSESSMENT:  Pt admitted with acute respiratory failure, weakness, N/V, episode of hypoglycemia this AM  PMHx:  Past Medical History  Diagnosis Date  . Asthma   . Collagen vascular disease   . Diabetes mellitus without complication   . Hypertension   . Coronary artery disease     a. cath 2013: stenting to RCA (report not available); b. cath 2014: LM nl, pLAD 40%, mLAD nl, ost LCx 40%, mid LCx nl, pRCA 30% @ site of prior stent, mRCA 50%  . Diabetic neuropathy   . ESRD (end stage renal disease) on dialysis     M-W-F  . GERD (gastroesophageal reflux disease)   . Hx of pancreatitis 2015  . Myocardial infarction   . Pneumonia   . Mitral regurgitation     a. echo 10/2013: EF 62%, noWMA, mildly dilated LA, mild to mod MR/TR, GR1DD  . COPD (chronic obstructive pulmonary disease)     Diet Order: downgraded to [facility] today  Current Nutrition: pt eating liquid tray on visit today, tolerating thus far; reports she ate grits this AM  Food/Nutrition-Related History: pt reports ok appetite, reports daughter prepares food for her  Medications: creon, reglan, renvela  Electrolyte/Renal Profile and Glucose Profile:   Recent Labs Lab 03/30/15 0846 04/01/15 1218 04/02/15 0444 04/02/15 1031  NA 136 138 140  --   K 2.8* 3.1* 2.9*  --   CL 98* 96* 100*  --   CO2 31 30 32  --   BUN 9 14 16   --   CREATININE 2.94* 5.53* 6.11*  --   CALCIUM 8.1* 8.7* 8.7*  --   MG  --   --   --  2.1  GLUCOSE 89 99 <20*  --     Protein Profile:   Recent Labs Lab 03/30/15 0846 04/01/15 1218 04/02/15 0444  ALBUMIN 2.9* 3.0* 2.9*    Gastrointestinal Profile: pt reports constipation, has BM every 4 days, noted last BM 6/7; no vomitting today, some nausea, dry heaves   Nutrition-Focused Physical Exam Findings:  Unable to complete Nutrition-Focused physical exam at this time.     Weight Change: pt reports weight has been relatively stable Anthropometrics:    Height:  Ht Readings from Last 1 Encounters:  04/01/15 5\' 5"  (1.651 m)    Weight:  Wt Readings from Last 1 Encounters:  04/02/15 193 lb (87.544 kg)    Ideal Body Weight:   IBW 56.8 kg  Wt Readings from Last 10 Encounters:  04/02/15 193 lb (87.544 kg)  03/30/15 195 lb 5.2 oz (88.6 kg)  03/14/15 176 lb (79.833 kg)  03/09/15 176 lb 12.9 oz (80.2 kg)  03/02/15 192 lb 14.4 oz (87.499 kg)  02/23/15 187 lb 6.3 oz (85 kg)  02/08/15 198 lb (89.812 kg)    BMI:  Body mass index is 32.12 kg/(m^2).  Estimated Nutritional Needs:  Kcal:  [id number] kCALS (BEE 1164, 1.3 AF, 1.1-1.3 IF)   Protein:  68-80 g (1.2-1.4 g/kg)   Fluid:  1000 mL  Diet Order:  Diet full liquid Room service appropriate?: Yes; Fluid consistency:: Thin   MODERATE Care Level  [facility] MS, RD, LDN [phone] Pager

## 2015-04-02 NOTE — Progress Notes (Signed)
Pre-hd tx 

## 2015-04-02 NOTE — Care Management (Addendum)
Patient is agreeable to have home health nurse aide and social work.  Patient has custody of two of her  2 grandchildren- age 56 and 41  and but says it is getting harder for her to keep up.  Says she may consider the foster system but "only if their old preacher can have them so they can go to their old church>"   Oldest child assists patient with checking her blood sugars.  If patient's medicaid is active, she would benefit from [facility] pcs services  And or may be a candidate for [facility].  For some reason, she thinks she does not have medicaid anymore.  [place] [facility] is closed for the day so unable to check medicaid status.  Referral made to [facility] via voicemail

## 2015-04-02 NOTE — Care Management Note (Signed)
Patient is active at [facility] on a MWF schedule.  I will send updated records to the clinic at discharge. [name]  [phone]

## 2015-04-02 NOTE — Progress Notes (Signed)
Subjective:  Patient seen during dialysis Tolerating well  Denies SOB at present Presented for Nausea and vomiting and generalized weakness Blood sugar dropped to 20 today  Objective:  Vital signs in last 24 hours:  Temp:  [97.5 F (36.4 C)-98.5 F (36.9 C)] 98 F (36.7 C) (07/11 0913) Pulse Rate:  [60-74] 61 (07/11 1000) Resp:  [18-20] 18 (07/11 1000) BP: (117-167)/(43-71) 139/69 mmHg (07/11 1000) SpO2:  [86 %-100 %] 100 % (07/11 1000) Weight:  [89.086 kg (196 lb 6.4 oz)-91.173 kg (201 lb)] 90.1 kg (198 lb 10.2 oz) (07/11 0913)  Weight change:  Filed Weights   04/01/15 1702 04/02/15 0501 04/02/15 0913  Weight: 89.449 kg (197 lb 3.2 oz) 89.086 kg (196 lb 6.4 oz) 90.1 kg (198 lb 10.2 oz)    Intake/Output:       Physical Exam: General: NAD, laying in bed  HEENT Anicteric, moist mucus membranes  Neck supple  Pulm/lungs Clear b/l, 3 L [place] oxygen  CVS/Heart Regular, no rub  Abdomen:  Soft, non tender  Extremities: Trace edema  Neurologic: Alert, oriented  Skin: No acute rashes  Access: AVG       Basic Metabolic Panel:  Recent Labs Lab 03/30/15 0846 04/01/15 1218 04/02/15 0444  NA 136 138 140  K 2.8* 3.1* 2.9*  CL 98* 96* 100*  CO2 31 30 32  GLUCOSE 89 99 <20*  BUN 9 14 16   CREATININE 2.94* 5.53* 6.11*  CALCIUM 8.1* 8.7* 8.7*     CBC:  Recent Labs Lab 03/30/15 0846 04/01/15 1218  WBC 6.6 4.8  NEUTROABS 3.5 2.8  HGB 8.0* 8.0*  HCT 24.4* 24.6*  MCV 84.9 86.0  PLT 169 190      Microbiology: Results for orders placed or performed during the hospital encounter of 02/19/15  Blood culture (routine x 2)     Status: None   Collection Time: 02/19/15 12:20 PM  Result Value Ref Range Status   Specimen Description BLOOD  Final   Special Requests Immunocompromised  Final   Culture NO GROWTH 5 DAYS  Final   Report Status 02/24/2015 FINAL  Final  Blood culture (routine x 2)     Status: None   Collection Time: 02/19/15 12:30 PM  Result Value Ref  Range Status   Specimen Description BLOOD  Final   Special Requests Immunocompromised  Final   Culture NO GROWTH 5 DAYS  Final   Report Status 02/24/2015 FINAL  Final  Body fluid culture     Status: None   Collection Time: 02/20/15  2:55 PM  Result Value Ref Range Status   Specimen Description PLEURAL  Final   Special Requests NONE  Final   Gram Stain   Final    FEW WBC SEEN RARE RED BLOOD CELLS FEW GRAM VARIABLE ROD    Culture NO GROWTH 4 DAYS  Final   Report Status 02/24/2015 FINAL  Final  Body fluid culture     Status: None   Collection Time: 02/21/15  4:30 PM  Result Value Ref Range Status   Specimen Description PLEURAL  Final   Special Requests NONE  Final   Gram Stain MANY WBC SEEN NO ORGANISMS SEEN   Final   Culture NO GROWTH 3 DAYS  Final   Report Status 02/26/2015 FINAL  Final    Coagulation Studies: No results for input(s): LABPROT, INR in the last 72 hours.  Urinalysis: No results for input(s): COLORURINE, LABSPEC, PHURINE, GLUCOSEU, HGBUR, BILIRUBINUR, KETONESUR, PROTEINUR, UROBILINOGEN, NITRITE, LEUKOCYTESUR in  the last 72 hours.  Invalid input(s): APPERANCEUR    Imaging: Dg Chest Portable 1 View  04/01/2015   CLINICAL DATA:  Hypoxia and shortness of breath.  EXAM: PORTABLE CHEST - 1 VIEW  COMPARISON:  03/14/2015 and prior radiographs  FINDINGS: Cardiomegaly is identified.  Small-moderate bilateral pleural effusions and bibasilar atelectasis noted.  Pulmonary vascular congestion is present.  There is no evidence of pneumothorax.  Vascular stents overlying the left subclavian axillary region and right arm noted.  IMPRESSION: Cardiomegaly with pulmonary vascular congestion, small -moderate bilateral pleural effusions and bibasilar atelectasis.   Electronically Signed   By: Margarette Canada M.D.   On: 04/01/2015 14:00     Medications:     . albuterol  3 mL Inhalation Q6H  . amLODipine  10 mg Oral Daily  . aspirin EC  81 mg Oral Daily  . atorvastatin  40 mg Oral  Daily  . buPROPion  150 mg Oral BID  . carvedilol  25 mg Oral BID  . cholecalciferol  800 Units Oral Daily  . citalopram  20 mg Oral Daily  . clopidogrel  75 mg Oral Daily  . famotidine  20 mg Oral Daily  . gabapentin  100 mg Oral Daily  . heparin subcutaneous  5,000 Units Subcutaneous Q12H  . hydrALAZINE  100 mg Oral TID  . insulin aspart  2-6 Units Subcutaneous TID WC  . lipase/protease/amylase  24,000 Units Oral BID BM  . lipase/protease/amylase  48,000 Units Oral TID WC  . loratadine  10 mg Oral Daily  . metoCLOPramide  5 mg Oral TID AC & HS  . omega-3 acid ethyl esters  2 g Oral Daily  . ranolazine  1,000 mg Oral BID  . sevelamer carbonate  800 mg Oral TID WC  . sodium chloride  3 mL Intravenous Q12H  . sucralfate  1 g Oral TID WC & HS   acetaminophen **OR** acetaminophen, ALPRAZolam, ALPRAZolam, HYDROcodone-acetaminophen, nitroGLYCERIN, ondansetron (ZOFRAN) IV, ondansetron  Assessment/ Plan:  54 y.o. AA female with ESRD, DM (30 yrs with retinopathy, neuropathy), OA, IBS, hyperlipidemia, h/o GI bleed, SHPTH, AOCD, diastolic heart failure , RCA stent Feb 2013, C. diff colitis 7/14, clotted RUE AVF 10/14, admission for shortness of breath 02/20/15. Abdominal pain N/V June 14, July 11  1. ESRD. [place]. MWF 1st shift 2. AOCKD- contd on EPO. Goal to keep ~  9-11  3. SHPTH . Monitor phos 4. Nausea and Vomiting- likely gastroparesis Plan:  Patient seen during dialysis Tolerating well  EPO     LOS: 1 [name] 7/11/201610:34 AM

## 2015-04-02 NOTE — Progress Notes (Signed)
HD tx start 

## 2015-04-02 NOTE — Progress Notes (Signed)
Pt has become restless, confused and impulsive. MD notified. Given ativan orally. Will continue to monitor

## 2015-04-03 DIAGNOSIS — J810 Acute pulmonary edema: Secondary | ICD-10-CM

## 2015-04-03 DIAGNOSIS — D638 Anemia in other chronic diseases classified elsewhere: Secondary | ICD-10-CM

## 2015-04-03 DIAGNOSIS — N2581 Secondary hyperparathyroidism of renal origin: Secondary | ICD-10-CM

## 2015-04-03 DIAGNOSIS — R112 Nausea with vomiting, unspecified: Secondary | ICD-10-CM

## 2015-04-03 DIAGNOSIS — E11649 Type 2 diabetes mellitus with hypoglycemia without coma: Secondary | ICD-10-CM

## 2015-04-03 LAB — GLUCOSE, CAPILLARY
Glucose-Capillary: 157 mg/dL — ABNORMAL HIGH (ref 65–99)
Glucose-Capillary: 37 mg/dL — CL (ref 65–99)
Glucose-Capillary: 54 mg/dL — ABNORMAL LOW (ref 65–99)
Glucose-Capillary: 70 mg/dL (ref 65–99)
Glucose-Capillary: 80 mg/dL (ref 65–99)
Glucose-Capillary: 89 mg/dL (ref 65–99)
Glucose-Capillary: 93 mg/dL (ref 65–99)

## 2015-04-03 LAB — POTASSIUM: Potassium: 3.9 mmol/L (ref 3.5–5.1)

## 2015-04-03 LAB — HEPATITIS B SURFACE ANTIGEN: Hepatitis B Surface Ag: NEGATIVE

## 2015-04-03 MED ORDER — DEXTROSE 50 % IV SOLN: 1.0000 | Freq: Once | INTRAVENOUS | Status: DC

## 2015-04-03 MED ORDER — DEXTROSE 50 % IV SOLN
INTRAVENOUS | Status: AC
  Filled 2015-04-03: qty 50

## 2015-04-03 MED ORDER — DOCUSATE SODIUM 100 MG PO CAPS: 100.0000 mg | ORAL_CAPSULE | Freq: Two times a day (BID) | ORAL | Status: DC

## 2015-04-03 MED ORDER — SODIUM CHLORIDE 0.9 % IJ SOLN
3.0000 mL | INTRAMUSCULAR | Status: DC | PRN
Start: 2015-04-03 — End: 2015-04-05

## 2015-04-03 MED ORDER — SENNA 8.6 MG PO TABS: 1.0000 | ORAL_TABLET | Freq: Every day | ORAL | Status: DC | PRN

## 2015-04-03 NOTE — Evaluation (Signed)
Physical Therapy Evaluation Patient Details Name: [name] MRN: [id number] DOB: 1961-01-24 Today's Date: 04/03/2015   History of Present Illness  [name] is a 54 y.o. female with a known history of ESRD on hemodialysis Monday, Wednesday, Friday comes in because of generalized body pains, shortness of breath. Patient had an complete dialysis on Friday usually one and half hour of dialysis. Patient could not tolerate dialysis on Friday secondary to nausea vomiting. She comes today with generalized body pains and shortness of breath. O2 saturations dropped to 86% on room air and went up to 93-94% on 2 L. Concerning this shortness of breath and hypoxia patient is to stay inpatient for hemodialysis tomorrow morning. Denies any chest pain, cough. Has been having low-grade temperatures since yesterday and generalized body pains. No diarrhea.  Clinical Impression  Pt is received semirecumbent in bed upon entry, awake, alert, and willing to participate. No acute distress noted. Pt is A&Ox3 and pleasant. Pt reports multiple falls in the last 6 months, reporting that her balance is very impaired at baseline. Pt strength as screened by functional mobility assessment presents general weak in BLE. Pt falls risk is high as evidenced by slow gait speed, poor forward reach, and history of frequent falls.Pt remaining on roomair throughout evaluation, but stable SaO2 c activity at 95%. Pt reports symptoms consistent c anemia, and hence Hb labs from June were between 10.0-11.0, have asked RN if possible to further establish labs, since most recent read at 8.0 on 7/10. Patient presenting with impairment of strength, balance, and activity tolerance, limiting ability to perform ADL and mobility tasks at  baseline level of function. Pt will need short term rehab stay to recover strength to functional baseline in order to return to PLOF in safe mobility at home. Patient will benefit from skilled intervention to address the  above impairments and limitations, in order to restore to prior level of function, improve patient safety upon discharge, and to decrease falls risk.       Follow Up Recommendations SNF    Equipment Recommendations  Rolling walker with 5" wheels    Recommendations for Other Services       Precautions / Restrictions Precautions Precautions: None Restrictions Weight Bearing Restrictions: No      Mobility  Bed Mobility Overal bed mobility: Modified Independent                Transfers Overall transfer level: Needs assistance Equipment used: 1 person hand held assist Transfers: Sit to/from Stand Sit to Stand: Supervision         General transfer comment: Requires moderate effort to perform; history of falls, reports legs feeling very weak.   Ambulation/Gait Ambulation/Gait assistance: Min guard Ambulation Distance (Feet): 80 Feet Assistive device: 1 person hand held assist Gait Pattern/deviations: Scissoring;Staggering left   Gait velocity interpretation: <1.8 ft/sec, indicative of risk for recurrent falls General Gait Details: Pt stopping periodically secondary to pain and weakness in bilat LE; pt c scissoring and poor trunk/hip control due to fatigue.   Stairs            Wheelchair Mobility    Modified Rankin (Stroke Patients Only)       Balance Overall balance assessment: History of Falls;Needs assistance Sitting-balance support: Feet supported;No upper extremity supported Sitting balance-Leahy Scale: Good     Standing balance support: Single extremity supported;During functional activity Standing balance-Leahy Scale: Poor Standing balance comment: scissoring c ambulation.  Pertinent Vitals/Pain Pain Assessment: Faces Faces Pain Scale: Hurts little more Pain Location: Does not report; c/o cramping and aching in legs B.  Pain Descriptors / Indicators: Aching;Cramping Pain Intervention(s): Limited  activity within patient's tolerance;Monitored during session    Home Living Family/patient expects to be discharged to:: Private residence Living Arrangements: Other (Comment) (Caretaker for grandchildren, intermittent help from daughter. ) Available Help at Discharge: Available PRN/intermittently;Family Type of Home: Apartment Home Access: Level entry     Home Layout: Two level;Able to live on main level with bedroom/bathroom Home Equipment: Gilford Rile - 2 wheels;Cane - single point      Prior Function Level of Independence: Independent with assistive device(s)         Comments: Common c/o dizziness and multiple falls, reports has been on meds for problem.      Hand Dominance        Extremity/Trunk Assessment   Upper Extremity Assessment: Overall WFL for tasks assessed           Lower Extremity Assessment: Generalized weakness         Communication   Communication: Expressive difficulties (Pt reports difficulty c dysarthria and mild anomia, difficulty concentrating. )  Cognition Arousal/Alertness: Awake/alert Behavior During Therapy: Anxious;WFL for tasks assessed/performed (Labile ) Overall Cognitive Status: Within Functional Limits for tasks assessed                      General Comments      Exercises        Assessment/Plan    PT Assessment Patient needs continued PT services  PT Diagnosis Difficulty walking;Abnormality of gait;Generalized weakness;Altered mental status   PT Problem List Decreased strength;Decreased activity tolerance;Decreased balance;Decreased mobility;Decreased cognition  PT Treatment Interventions DME instruction;Gait training;Stair training;Therapeutic activities;Therapeutic exercise;Balance training   PT Goals (Current goals can be found in the Care Plan section) Acute Rehab PT Goals Patient Stated Goal: recover strength so she is able to care for grandkids PT Goal Formulation: With patient Time For Goal Achievement:  04/17/15 Potential to Achieve Goals: Good    Frequency Min 2X/week   Barriers to discharge Decreased caregiver support      Co-evaluation               End of Session Equipment Utilized During Treatment: Gait belt Activity Tolerance: Patient limited by fatigue;Patient limited by pain Patient left: in bed;with bed alarm set;with call bell/phone within reach Nurse Communication: Mobility status;Other (comment) (Would like to see updated Hb labs)         Time: [id number] PT Time Calculation (min) (ACUTE ONLY): 21 min   Charges:   PT Evaluation $Initial PT Evaluation Tier I: 1 Procedure PT Treatments $Therapeutic Activity: 8-22 mins   PT G Codes:        Jaiden Wahab C 2015/04/23, 4:31 PM 4:38 PM  Etta Grandchild, PT, DPT  License # [id number]

## 2015-04-03 NOTE — Plan of Care (Signed)
Problem: Acute Rehab PT Goals(only PT should resolve) Goal: Pt Will Ambulate Pt will ambulate with LRAD at Supervision using a step-through pattern and equal step length for a distances greater than 200ft to demonstrate the ability to perform safe household distance ambulation at discharge.     

## 2015-04-03 NOTE — Care Management Note (Signed)
Case Management Note  Patient Details  Name: [name] MRN: [id number] Date of Birth: 03-29-1961  Subjective/Objective:  Spoke with Dr. Ether Griffins regarding home health services. She would like patient to have RN, PT, HHA and SW. Spoke with patient who is agreeable with no agency preference. She is "pretty sure" she does not have Medicaid active. Referral to [place]. Patient discharging today. PCP is Ellin Goodie at [facility].  Patient updated on POC.                    Action/Plan: Home with [place], PT, HHA and SW through [place]  Expected Discharge Date:                  Expected Discharge Plan:  [place]  In-House Referral:     Discharge planning Services  CM Consult  Post Acute Care Choice:    Choice offered to:  Patient  DME Arranged:    DME Agency:     HH Arranged:  RN, PT, Nurse's Aide [place] Agency:  [place]  Status of Service:  Completed, signed off  Medicare Important Message Given:  Yes-second notification given Date Medicare IM Given:    Medicare IM give by:    Date Additional Medicare IM Given:    Additional Medicare Important Message give by:     If discussed at [employer] of Stay Meetings, dates discussed:    Additional Comments:  Jolly Mango, RN 04/03/2015, 3:19 PM

## 2015-04-03 NOTE — Clinical Social Work Note (Signed)
CSW attempted to speak to pt 2x today pt was either unavailable or sleeping and would not wake for CSW.  CSW will attempt again tomorrow.

## 2015-04-03 NOTE — Progress Notes (Signed)
Subjective:  Dialyzed yesterday without problem Ate breakfast today No acute c/o  Objective:  Vital signs in last 24 hours:  Temp:  [98.7 F (37.1 C)-99.1 F (37.3 C)] 98.8 F (37.1 C) (07/12 0735) Pulse Rate:  [60-77] 77 (07/12 0735) Resp:  [13-22] 17 (07/12 0735) BP: (113-160)/(37-94) 121/47 mmHg (07/12 0735) SpO2:  [93 %-100 %] 93 % (07/12 0752) Weight:  [87.544 kg (193 lb)-88.7 kg (195 lb 8.8 oz)] 87.544 kg (193 lb) (07/11 1441)  Weight change: -1.073 kg (-2 lb 5.9 oz) Filed Weights   04/02/15 0913 04/02/15 1315 04/02/15 1441  Weight: 90.1 kg (198 lb 10.2 oz) 88.7 kg (195 lb 8.8 oz) 87.544 kg (193 lb)    Intake/Output: I/O last 3 completed shifts: In: 480 [P.O.:480] Out: 2000 [Other:2000]     Physical Exam: General: NAD, laying in bed  HEENT Anicteric, moist mucus membranes  Neck supple  Pulm/lungs Clear b/l,   CVS/Heart Regular, no rub  Abdomen:  Soft, non tender  Extremities: Trace edema  Neurologic: Alert, oriented  Skin: No acute rashes  Access: AVG       Basic Metabolic Panel:  Recent Labs Lab 03/30/15 0846 04/01/15 1218 04/02/15 0444 04/02/15 1031 04/03/15 0539  NA 136 138 140  --   --   K 2.8* 3.1* 2.9*  --  3.9  CL 98* 96* 100*  --   --   CO2 31 30 32  --   --   GLUCOSE 89 99 <20*  --   --   BUN 9 14 16   --   --   CREATININE 2.94* 5.53* 6.11*  --   --   CALCIUM 8.1* 8.7* 8.7*  --   --   MG  --   --   --  2.1  --      CBC:  Recent Labs Lab 03/30/15 0846 04/01/15 1218  WBC 6.6 4.8  NEUTROABS 3.5 2.8  HGB 8.0* 8.0*  HCT 24.4* 24.6*  MCV 84.9 86.0  PLT 169 190      Microbiology: Results for orders placed or performed during the hospital encounter of 02/19/15  Blood culture (routine x 2)     Status: None   Collection Time: 02/19/15 12:20 PM  Result Value Ref Range Status   Specimen Description BLOOD  Final   Special Requests Immunocompromised  Final   Culture NO GROWTH 5 DAYS  Final   Report Status 02/24/2015 FINAL   Final  Blood culture (routine x 2)     Status: None   Collection Time: 02/19/15 12:30 PM  Result Value Ref Range Status   Specimen Description BLOOD  Final   Special Requests Immunocompromised  Final   Culture NO GROWTH 5 DAYS  Final   Report Status 02/24/2015 FINAL  Final  Body fluid culture     Status: None   Collection Time: 02/20/15  2:55 PM  Result Value Ref Range Status   Specimen Description PLEURAL  Final   Special Requests NONE  Final   Gram Stain   Final    FEW WBC SEEN RARE RED BLOOD CELLS FEW GRAM VARIABLE ROD    Culture NO GROWTH 4 DAYS  Final   Report Status 02/24/2015 FINAL  Final  Body fluid culture     Status: None   Collection Time: 02/21/15  4:30 PM  Result Value Ref Range Status   Specimen Description PLEURAL  Final   Special Requests NONE  Final   Gram Stain MANY WBC SEEN  NO ORGANISMS SEEN   Final   Culture NO GROWTH 3 DAYS  Final   Report Status 02/26/2015 FINAL  Final    Coagulation Studies: No results for input(s): LABPROT, INR in the last 72 hours.  Urinalysis: No results for input(s): COLORURINE, LABSPEC, PHURINE, GLUCOSEU, HGBUR, BILIRUBINUR, KETONESUR, PROTEINUR, UROBILINOGEN, NITRITE, LEUKOCYTESUR in the last 72 hours.  Invalid input(s): APPERANCEUR    Imaging: Dg Chest Portable 1 View  04/01/2015   CLINICAL DATA:  Hypoxia and shortness of breath.  EXAM: PORTABLE CHEST - 1 VIEW  COMPARISON:  03/14/2015 and prior radiographs  FINDINGS: Cardiomegaly is identified.  Small-moderate bilateral pleural effusions and bibasilar atelectasis noted.  Pulmonary vascular congestion is present.  There is no evidence of pneumothorax.  Vascular stents overlying the left subclavian axillary region and right arm noted.  IMPRESSION: Cardiomegaly with pulmonary vascular congestion, small -moderate bilateral pleural effusions and bibasilar atelectasis.   Electronically Signed   By: Margarette Canada M.D.   On: 04/01/2015 14:00     Medications:     . albuterol  3 mL  Inhalation Q6H  . amLODipine  10 mg Oral Daily  . aspirin EC  81 mg Oral Daily  . atorvastatin  40 mg Oral Daily  . buPROPion  150 mg Oral BID  . carvedilol  25 mg Oral BID  . cholecalciferol  800 Units Oral Daily  . citalopram  20 mg Oral Daily  . clopidogrel  75 mg Oral Daily  . dextrose  1 ampule Intravenous Once  . dextrose      . docusate sodium  100 mg Oral BID  . epoetin (EPOGEN/PROCRIT) injection  4,000 Units Subcutaneous Q M,W,F-HD  . famotidine  20 mg Oral Daily  . gabapentin  100 mg Oral Daily  . heparin subcutaneous  5,000 Units Subcutaneous Q12H  . hydrALAZINE  100 mg Oral TID  . insulin aspart  2-6 Units Subcutaneous TID WC  . lipase/protease/amylase  24,000 Units Oral BID BM  . lipase/protease/amylase  48,000 Units Oral TID WC  . loratadine  10 mg Oral Daily  . metoCLOPramide  5 mg Oral TID AC & HS  . omega-3 acid ethyl esters  2 g Oral Daily  . pantoprazole  40 mg Oral Daily  . ranolazine  1,000 mg Oral BID  . sevelamer carbonate  800 mg Oral TID WC  . sodium chloride  3 mL Intravenous Q12H  . sucralfate  1 g Oral TID WC & HS   acetaminophen **OR** acetaminophen, ALPRAZolam, ALPRAZolam, HYDROcodone-acetaminophen, nitroGLYCERIN, ondansetron (ZOFRAN) IV, ondansetron, senna  Assessment/ Plan:  54 y.o. AA female with ESRD, DM (30 yrs with retinopathy, neuropathy), OA, IBS, hyperlipidemia, h/o GI bleed, SHPTH, AOCD, diastolic heart failure , RCA stent Feb 2013, C. diff colitis 7/14, clotted RUE AVF 10/14, admission for shortness of breath 02/20/15. Abdominal pain N/V June 14, July 11  1. ESRD. [place]. MWF 1st shift 2. AOCKD- contd on EPO. Goal to keep ~  9-11  3. SHPTH . Monitor phos 4. Nausea and Vomiting- likely gastroparesis Plan: 2000 cc removed with HD  EPO with HD Overall improved today     LOS: 2 Yocelyn Brocious 7/12/201610:07 AM

## 2015-04-03 NOTE — Progress Notes (Signed)
[place] [facility] [place] NAME: [name]    MR#:  [id number]  DATE OF BIRTH:  May 20, 1961  SUBJECTIVE:  CHIEF COMPLAINT:   Chief Complaint  Patient presents with  . Abdominal Pain  . Joint Pain  . Headache  . Nausea   Patient is 54 year old female with history of end-stage renal disease who underwent hemodialysis on Friday presents to the hospital with nausea, vomiting, abdominal pain as well as hypoxia. Her chest x-ray in the emergency room revealed cardiomegaly, pulmonary vascular congestion and a small to moderate bilateral pleural effusions. The patient underwent hemodialysis with 2 L of fluid removal yesterday 11th of July 2016 post hemodialysis. She felt satisfactory. Her oxygenation is getting better and now she is off oxygen completely. Nausea and vomiting, also subsided and she is able to advance her diet Review of Systems  Constitutional: Negative for fever, chills and weight loss.  HENT: Negative for congestion.   Eyes: Negative for blurred vision and double vision.  Respiratory: Positive for shortness of breath. Negative for cough, sputum production and wheezing.   Cardiovascular: Negative for chest pain, palpitations, orthopnea, leg swelling and PND.  Gastrointestinal: Positive for nausea and abdominal pain. Negative for vomiting, diarrhea, constipation and blood in stool.  Genitourinary: Negative for dysuria, urgency, frequency and hematuria.  Musculoskeletal: Negative for falls.  Neurological: Negative for dizziness, tremors, focal weakness and headaches.  Endo/Heme/Allergies: Does not bruise/bleed easily.  Psychiatric/Behavioral: Negative for depression. The patient does not have insomnia.     VITAL SIGNS: Blood pressure 118/77, pulse 73, temperature 98.6 F (37 C), temperature source Oral, resp. rate 18, height 5\' 5"  (1.651 m), weight 87.544 kg (193 lb), SpO2 95 %.  PHYSICAL EXAMINATION:   GENERAL:  54 y.o.-year-old  patient lying in the bed with no acute distress. Dry oral mucosa EYES: Pupils equal, round, reactive to light and accommodation. No scleral icterus. Extraocular muscles intact.  HEENT: Head atraumatic, normocephalic. Oropharynx and nasopharynx clear.  NECK:  Supple, no jugular venous distention. No thyroid enlargement, no tenderness.  LUNGS: Some diminished breath sounds bilaterally, few crepitations noted, but decreased from yesterday. No use of accessory muscles of respiration.  CARDIOVASCULAR: S1, S2 normal. No murmurs, rubs, or gallops.  ABDOMEN: Soft, nontender, nondistended. Bowel sounds present. No organomegaly or mass.  EXTREMITIES: No pedal edema, cyanosis, or clubbing.  NEUROLOGIC: Cranial nerves II through XII are intact. Muscle strength 5/5 in all extremities. Sensation intact. Gait not checked.  PSYCHIATRIC: The patient is alert and oriented x 3.  SKIN: No obvious rash, lesion, or ulcer.   ORDERS/RESULTS REVIEWED:   CBC  Recent Labs Lab 03/30/15 0846 04/01/15 1218  WBC 6.6 4.8  HGB 8.0* 8.0*  HCT 24.4* 24.6*  PLT 169 190  MCV 84.9 86.0  MCH 27.8 27.8  MCHC 32.7 32.3  RDW 19.9* 19.9*  LYMPHSABS 2.2 1.5  MONOABS 0.6 0.3  EOSABS 0.3 0.2  BASOSABS 0.0 0.0   ------------------------------------------------------------------------------------------------------------------  Chemistries   Recent Labs Lab 03/30/15 0846 04/01/15 1218 04/02/15 0444 04/02/15 1031 04/03/15 0539  NA 136 138 140  --   --   K 2.8* 3.1* 2.9*  --  3.9  CL 98* 96* 100*  --   --   CO2 31 30 32  --   --   GLUCOSE 89 99 <20*  --   --   BUN 9 14 16   --   --   CREATININE 2.94* 5.53* 6.11*  --   --  CALCIUM 8.1* 8.7* 8.7*  --   --   MG  --   --   --  2.1  --   AST 20 22 19   --   --   ALT 12* 12* 13*  --   --   ALKPHOS 104 111 119  --   --   BILITOT 0.6 0.8 0.5  --   --     ------------------------------------------------------------------------------------------------------------------ estimated creatinine clearance is 11.4 mL/min (by C-G formula based on Cr of 6.11). ------------------------------------------------------------------------------------------------------------------ No results for input(s): TSH, T4TOTAL, T3FREE, THYROIDAB in the last 72 hours.  Invalid input(s): FREET3  Cardiac Enzymes  Recent Labs Lab 04/01/15 1218  TROPONINI <0.03   ------------------------------------------------------------------------------------------------------------------ Invalid input(s): POCBNP ---------------------------------------------------------------------------------------------------------------  RADIOLOGY: No results found.  EKG:  Orders placed or performed during the hospital encounter of 04/01/15  . ED EKG   (only if pt is 56 y.o. or older and pain is above umbilicus)  . ED EKG   (only if pt is 56 y.o. or older and pain is above umbilicus)  . EKG 12-Lead  . EKG 12-Lead    ASSESSMENT AND PLAN:  Active Problems:   Acute respiratory failure with hypoxia   Pressure ulcer *. Aacute respiratory failure with hypoxia due to acute pulmonary edema, status post hemodialysis with 2 L of fluid removal on 04/02/2015 *. Nausea and vomiting, questionable acute gastroparesis exacerbation, continue patient on PPIs and now on advanced diet *Hypoglycemia, resume Lantus when patient is taking good PO * Hypokalemia, supplement orally, follow potassium level tomorrow morning. Get magnesium level checked *. Anemia, likely anemia of chronic disease. Patient is receiving Epogen with hemodialysis.      Management plans discussed with the patient, family and they are in agreement.   DRUG ALLERGIES:  Allergies  Allergen Reactions  . Ace Inhibitors Swelling  . Codeine Nausea And Vomiting  . Compazine [Prochlorperazine Edisylate] Nausea And Vomiting  .  Losartan Other (See Comments)  . Phenergan [Promethazine Hcl] Other (See Comments)    Hallucinations  . Zofran [Ondansetron Hcl] Other (See Comments)    Dementia sx  . Oxycodone Anxiety  . Tape Rash    CODE STATUS:     Code Status Orders        Start     Ordered   04/01/15 1555  Full code   Continuous     04/01/15 1558    Advance Directive Documentation        Most Recent Value   Type of Advance Directive  Living will   Pre-existing out of facility DNR order (yellow form or pink MOST form)     "MOST" Form in Place?        TOTAL TIME TAKING CARE OF THIS PATIENT: 62minutes.    Theodoro Grist M.D on 04/03/2015 at 2:56 PM  Between 7am to 6pm - Pager - [phone]  After 6pm go to [email or web address] - password [email or web address] [facility]  Office  [phone]  CC: Primary care physician; Ellamae Sia, MD

## 2015-04-03 NOTE — Care Management Important Message (Signed)
Important Message  Patient Details  Name: [name] MRN: [id number] Date of Birth: 11-19-1960   Medicare Important Message Given:  Yes-second notification given    [name] A Allmond 04/03/2015, 10:17 AM

## 2015-04-03 NOTE — Discharge Summary (Addendum)
[facility] NAME: [name]    MR#:  [id number]  DATE OF BIRTH:  09-10-1961  DATE OF ADMISSION:  04/01/2015 ADMITTING PHYSICIAN: Epifanio Lesches, MD  DATE OF DISCHARGE: 04/03/2015  PRIMARY CARE PHYSICIAN: Ellamae Sia, MD     ADMISSION DIAGNOSIS:  Hypoxia [R09.02] ESRD (end stage renal disease) [N18.6] Pulmonary vascular congestion [R09.89]  DISCHARGE DIAGNOSIS:  Principal Problem:   Acute respiratory failure with hypoxia Active Problems:   Acute pulmonary edema   Nausea and vomiting   Hypoglycemia associated with diabetes   Pressure ulcer   Anemia of chronic disease   Secondary hyperparathyroidism   SECONDARY DIAGNOSIS:   Past Medical History  Diagnosis Date  . Asthma   . Collagen vascular disease   . Diabetes mellitus without complication   . Hypertension   . Coronary artery disease     a. cath 2013: stenting to RCA (report not available); b. cath 2014: LM nl, pLAD 40%, mLAD nl, ost LCx 40%, mid LCx nl, pRCA 30% @ site of prior stent, mRCA 50%  . Diabetic neuropathy   . ESRD (end stage renal disease) on dialysis     M-W-F  . GERD (gastroesophageal reflux disease)   . Hx of pancreatitis 2015  . Myocardial infarction   . Pneumonia   . Mitral regurgitation     a. echo 10/2013: EF 62%, noWMA, mildly dilated LA, mild to mod MR/TR, GR1DD  . COPD (chronic obstructive pulmonary disease)     .pro HOSPITAL COURSE:   Patient is 54 year old African-American female with history of end-stage renal disease who presents to the hospital with complaints of nausea, vomiting, abdominal pain and hypoxia. Her labs revealed hypokalemia with potassium level of 2.8 in the emergency room. Her chest x-ray showed pulmonary vascular congestion as well as small to moderate bilateral pleural effusions and a basal atelectasis. She was admitted to the hospital for further evaluation. She was initiated on liquid diet as well as  conservative therapy with antiemetics. She was consulted by nephrologist and hemodialysis was performed on 04/02/2015 withdrawing 2 L of fluid. After hemodialysis. Patient felt satisfactory and her oxygenation improved. She was weaned down to room air and her oxygen saturations remained at 96%. In regards to abdominal pain and nausea, vomiting. It was felt that patient's nausea, vomiting and abdominal pain was related to gastroparesis with conservative management, as well as stool softeners and bowel movement 04/02/2015 patient's condition improved. Her diet was advanced and she was able to eat regular diet prior to discharge from the hospital Discussion by problem *. Acute respiratory failure with hypoxia due to acute pulmonary edema, acute diastolic CHF , status post hemodialysis with 2 L of fluid removal on 04/02/2015, also hemodialysis on 04/04/2015 , weaning off oxygen to  room air. Checking oxygen saturations on exertion. Continue hemodialysis as per prior schedule *. Nausea and vomiting, likely gastroparesis exacerbation, evaluated by gastroenterologist recently and had EGD , patient is to continue on PPIs and Carafate, continue hemodialysis diet as tolerated, preferably small amount of food frequently during the daytime , unfortunately, still poorly tolerance of even soft diet *Hypoglycemia, discontinue Lantus , continue sliding scale insulin for now, advance patient's diabetic medications if oral intake improves as outpatient * Hypokalemia, supplemented orally, potassium level normalized,  magnesium level was normal *. Anemia, likely anemia of chronic disease. Patient is receiving Epogen with hemodialysis, to be continued on the same * Hypertension, essential. Continue outpatient medications. Blood  pressure is well controlled, although care should be given to hydralazine administration as hydralazine itself can cause nausea episodes Patient was not discharged home since at physical therapist who  evaluated her in the hospital felt that she would benefit from skilled nursing facility placement for rehabilitation short-term. Management was consulted and skilled nursing facility bed was found. It is felt that patient should be discharged today on 04/05/2015. As long as she is able to tolerate full liquid diet. Periodically while in the hospital, she was still having episodes of nausea and vomiting and abdominal pain in epigastric area, which are chronic. It was felt that patient's diet should be diabetic hemodialysis diet given up to 6 times a day. Small amount of food, to be advanced as tolerated to soft diet. Patient is to follow up with gastroenterologist as outpatient. It is also recommended to follow and determine if in fact, and hydralazine could have been playing a role regarding to patient's nausea and vomiting.  DISCHARGE CONDITIONS:   Fair  CONSULTS OBTAINED:  Treatment Team:  Epifanio Lesches, MD Murlean Iba, MD  DRUG ALLERGIES:   Allergies  Allergen Reactions  . Ace Inhibitors Swelling  . Codeine Nausea And Vomiting  . Compazine [Prochlorperazine Edisylate] Nausea And Vomiting  . Losartan Other (See Comments)  . Phenergan [Promethazine Hcl] Other (See Comments)    Hallucinations  . Zofran [Ondansetron Hcl] Other (See Comments)    Dementia sx  . Oxycodone Anxiety  . Tape Rash    DISCHARGE MEDICATIONS:   Current Discharge Medication List    START taking these medications   Details  docusate sodium (COLACE) 100 MG capsule Take 1 capsule (100 mg total) by mouth 2 (two) times daily. Qty: 10 capsule, Refills: 0    senna (SENOKOT) 8.6 MG TABS tablet Take 1 tablet (8.6 mg total) by mouth daily as needed for mild constipation. Qty: 120 each, Refills: 0      CONTINUE these medications which have NOT CHANGED   Details  albuterol (PROVENTIL HFA;VENTOLIN HFA) 108 (90 BASE) MCG/ACT inhaler Inhale 4-6 puffs by mouth every 4 hours as needed for wheezing, cough, and/or  shortness of breath Qty: 1 Inhaler, Refills: 1    ALPRAZolam (XANAX) 0.25 MG tablet Take 1 tablet (0.25 mg total) by mouth 2 (two) times daily as needed for anxiety. Qty: 60 tablet, Refills: 0    amLODipine (NORVASC) 10 MG tablet Take 10 mg by mouth daily.    aspirin EC 81 MG tablet Take 81 mg by mouth daily.    atorvastatin (LIPITOR) 40 MG tablet Take 40 mg by mouth daily.    buPROPion (WELLBUTRIN SR) 150 MG 12 hr tablet Take 150 mg by mouth 2 (two) times daily.    carvedilol (COREG) 25 MG tablet Take 25 mg by mouth 2 (two) times daily.    cetirizine (ZYRTEC) 10 MG tablet Take 10 mg by mouth daily.    citalopram (CELEXA) 20 MG tablet Take 20 mg by mouth daily.    clopidogrel (PLAVIX) 75 MG tablet Take 75 mg by mouth daily.    gabapentin (NEURONTIN) 100 MG capsule Take 100 mg by mouth daily.    hydrALAZINE (APRESOLINE) 100 MG tablet Take 1 tablet (100 mg total) by mouth 3 (three) times daily. Qty: 90 tablet, Refills: 1    HYDROcodone-acetaminophen (NORCO/VICODIN) 5-325 MG per tablet Take 1 tablet by mouth every 6 (six) hours as needed for moderate pain. Qty: 20 tablet, Refills: 0    hydrocortisone 1 % ointment Apply 1  application topically 2 (two) times daily.    insulin aspart (NOVOLOG) 100 UNIT/ML injection Inject 2-6 Units into the skin 3 (three) times daily with meals. Pt uses as needed per sliding scale.    insulin detemir (LEVEMIR) 100 UNIT/ML injection Inject 8 Units into the skin at bedtime.    lipase/protease/amylase (CREON) 12000 UNITS CPEP capsule Take 24,000-48,000 Units by mouth 5 (five) times daily. Pt takes four capsules with meals and two capsules with snacks.    metoCLOPramide (REGLAN) 5 MG tablet Take 1 tablet (5 mg total) by mouth 4 (four) times daily -  before meals and at bedtime. Qty: 40 tablet, Refills: 0    nitroGLYCERIN (NITROSTAT) 0.4 MG SL tablet Place 0.4 mg under the tongue every 5 (five) minutes x 3 doses as needed for chest pain.     Omega-3  Fatty Acids (FISH OIL) 1000 MG CAPS Take 1 capsule by mouth 2 (two) times daily.    ondansetron (ZOFRAN) 4 MG tablet Take 1 tablet (4 mg total) by mouth daily as needed for nausea or vomiting. Qty: 20 tablet, Refills: 1    pantoprazole (PROTONIX) 40 MG tablet Take 1 tablet (40 mg total) by mouth 2 (two) times daily before a meal. Qty: 60 tablet, Refills: 0    ranitidine (ZANTAC) 150 MG tablet Take 150 mg by mouth 2 (two) times daily.    ranolazine (RANEXA) 1000 MG SR tablet Take 1,000 mg by mouth 2 (two) times daily.     sevelamer carbonate (RENVELA) 800 MG tablet Take 800 mg by mouth 3 (three) times daily with meals.    sucralfate (CARAFATE) 1 G tablet Take 1 tablet (1 g total) by mouth 4 (four) times daily -  with meals and at bedtime. Qty: 120 tablet, Refills: 0    Vitamin D, Cholecalciferol, 400 UNITS TABS Take 400 Units by mouth 2 (two) times daily.          DISCHARGE INSTRUCTIONS:    Follow up with primary care physician, Dr. Quay Burow, and hemodialysis as previously scheduled  If you experience worsening of your admission symptoms, develop shortness of breath, life threatening emergency, suicidal or homicidal thoughts you must seek medical attention immediately by calling 911 or calling your MD immediately  if symptoms less severe.  You Must read complete instructions/literature along with all the possible adverse reactions/side effects for all the Medicines you take and that have been prescribed to you. Take any new Medicines after you have completely understood and accept all the possible adverse reactions/side effects.   Please note  You were cared for by a hospitalist during your hospital stay. If you have any questions about your discharge medications or the care you received while you were in the hospital after you are discharged, you can call the unit and asked to speak with the hospitalist on call if the hospitalist that took care of you is not available. Once you are  discharged, your primary care physician will handle any further medical issues. Please note that NO REFILLS for any discharge medications will be authorized once you are discharged, as it is imperative that you return to your primary care physician (or establish a relationship with a primary care physician if you do not have one) for your aftercare needs so that they can reassess your need for medications and monitor your lab values.    Today   CHIEF COMPLAINT:   Chief Complaint  Patient presents with  . Abdominal Pain  . Joint Pain  .  Headache  . Nausea    HISTORY OF PRESENT ILLNESS:  Ilze Gerdeman  is a 54 y.o. female with a known history of end-stage renal disease, gastroparesis, who presents to the hospital with nausea, vomiting, abdominal pain, as well as shortness of breath and was noted to have  pulmonary edema.Her labs revealed hypokalemia with potassium level of 2.8 in the emergency room. Her chest x-ray showed pulmonary vascular congestion as well as small to moderate bilateral pleural effusions and a basal atelectasis. She was admitted to the hospital for further evaluation. She was initiated on liquid diet as well as conservative therapy with antiemetics. She was consulted by nephrologist and hemodialysis was performed on 04/02/2015 withdrawing 2 L of fluid. After hemodialysis. Patient felt satisfactory and her oxygenation improved. She was weaned down to room air and her oxygen saturations remained at 96%. In regards to abdominal pain and nausea, vomiting. It was felt that patient's nausea, vomiting and abdominal pain was related to gastroparesis with conservative management, as well as stool softeners and bowel movement 04/02/2015 patient's condition improved. Her diet was advanced and she was able to eat regular diet prior to discharge from the hospital Discussion by problem *. Acute respiratory failure with hypoxia due to acute pulmonary edema, acute diastolic CHF , status post  hemodialysison 04/02/2015, and 04/04/2015 , being weaned off oxygen to room air. Following oxygen saturations and continue oxygen as needed, keeping pulse oximetry at around 88% and above *. Nausea and vomiting, likely gastroparesis exacerbation, continue patient on PPIs and Carafate. Unfortunately, poor tolerance of soft diet. Still continue a full liquid diet to be advanced to soft and regular consistency as outpatient, it is recommended to continue small portions of food more frequently *Hypoglycemia, discontinue Lantus upon discharge, as well as diabetic medications if oral intake improves * Hypokalemia, supplemented orally, potassium level normalized,  magnesium level was normal *. Anemia, likely anemia of chronic disease. Patient is receiving Epogen with hemodialysis * Essential hypertension. Continue outpatient medications. Follow patient's condition with hydralazine administration as hydralazine can cause nausea   Patient was not discharged home since at physical therapist who evaluated her in the hospital felt that she would benefit from skilled nursing facility placement for rehabilitation short-term. Management was consulted and skilled nursing facility bed was found. It is felt that patient should be discharged today on 04/05/2015. As long as she is able to tolerate full liquid diet. Periodically while in the hospital, she was still having episodes of nausea and vomiting and abdominal pain in epigastric area, which are chronic. It was felt that patient's diet should be diabetic hemodialysis diet given up to 6 times a day. Small amount of food, to be advanced as tolerated to soft diet. Patient is to follow up with gastroenterologist as outpatient. It is also recommended to follow and determine if in fact, and hydralazine could have been playing a role regarding to patient's nausea and vomiting.  VITAL SIGNS:  Blood pressure 118/77, pulse 73, temperature 98.6 F (37 C), temperature source Oral,  resp. rate 18, height 5\' 5"  (1.651 m), weight 87.544 kg (193 lb), SpO2 95 %.  I/O:   Intake/Output Summary (Last 24 hours) at 04/03/15 1506 Last data filed at 04/03/15 1130  Gross per 24 hour  Intake   1920 ml  Output      0 ml  Net   1920 ml    PHYSICAL EXAMINATION:  GENERAL:  54 y.o.-year-old patient lying in the bed with no acute distress.  EYES:  Pupils equal, round, reactive to light and accommodation. No scleral icterus. Extraocular muscles intact.  HEENT: Head atraumatic, normocephalic. Oropharynx and nasopharynx clear.  NECK:  Supple, no jugular venous distention. No thyroid enlargement, no tenderness.  LUNGS: Normal breath sounds bilaterally, no wheezing, rales,rhonchi or crepitation. No use of accessory muscles of respiration.  CARDIOVASCULAR: S1, S2 normal. No murmurs, rubs, or gallops.  ABDOMEN: Soft, epigastric tenderness, minimal distention. Bowel sounds present. No organomegaly or mass.  EXTREMITIES: No pedal edema, cyanosis, or clubbing.  NEUROLOGIC: Cranial nerves II through XII are intact. Muscle strength 5/5 in all extremities. Sensation intact. Gait not checked.  PSYCHIATRIC: The patient is alert and oriented x 3.  SKIN: No obvious rash, lesion, or ulcer.   DATA REVIEW:   CBC  Recent Labs Lab 04/01/15 1218  WBC 4.8  HGB 8.0*  HCT 24.6*  PLT 190    Chemistries   Recent Labs Lab 04/02/15 0444 04/02/15 1031 04/03/15 0539  NA 140  --   --   K 2.9*  --  3.9  CL 100*  --   --   CO2 32  --   --   GLUCOSE <20*  --   --   BUN 16  --   --   CREATININE 6.11*  --   --   CALCIUM 8.7*  --   --   MG  --  2.1  --   AST 19  --   --   ALT 13*  --   --   ALKPHOS 119  --   --   BILITOT 0.5  --   --     Cardiac Enzymes  Recent Labs Lab 04/01/15 1218  TROPONINI <0.03    Microbiology Results  Results for orders placed or performed during the hospital encounter of 02/19/15  Blood culture (routine x 2)     Status: None   Collection Time: 02/19/15 12:20  PM  Result Value Ref Range Status   Specimen Description BLOOD  Final   Special Requests Immunocompromised  Final   Culture NO GROWTH 5 DAYS  Final   Report Status 02/24/2015 FINAL  Final  Blood culture (routine x 2)     Status: None   Collection Time: 02/19/15 12:30 PM  Result Value Ref Range Status   Specimen Description BLOOD  Final   Special Requests Immunocompromised  Final   Culture NO GROWTH 5 DAYS  Final   Report Status 02/24/2015 FINAL  Final  Body fluid culture     Status: None   Collection Time: 02/20/15  2:55 PM  Result Value Ref Range Status   Specimen Description PLEURAL  Final   Special Requests NONE  Final   Gram Stain   Final    FEW WBC SEEN RARE RED BLOOD CELLS FEW GRAM VARIABLE ROD    Culture NO GROWTH 4 DAYS  Final   Report Status 02/24/2015 FINAL  Final  Body fluid culture     Status: None   Collection Time: 02/21/15  4:30 PM  Result Value Ref Range Status   Specimen Description PLEURAL  Final   Special Requests NONE  Final   Gram Stain MANY WBC SEEN NO ORGANISMS SEEN   Final   Culture NO GROWTH 3 DAYS  Final   Report Status 02/26/2015 FINAL  Final    RADIOLOGY:  No results found.  EKG:   Orders placed or performed during the hospital encounter of 04/01/15  . ED EKG   (only if pt  is 56 y.o. or older and pain is above umbilicus)  . ED EKG   (only if pt is 54 y.o. or older and pain is above umbilicus)  . EKG 12-Lead  . EKG 12-Lead      Management plans discussed with the patient, family and they are in agreement.  CODE STATUS:     Code Status Orders        Start     Ordered   04/01/15 1555  Full code   Continuous     04/01/15 1558    Advance Directive Documentation        Most Recent Value   Type of Advance Directive  Living will   Pre-existing out of facility DNR order (yellow form or pink MOST form)     "MOST" Form in Place?        TOTAL TIME TAKING CARE OF THIS PATIENT: 50 minutes.    Theodoro Grist M.D on 04/03/2015 at  3:06 PM  Between 7am to 6pm - Pager - [phone]  After 6pm go to [email or web address] - password [email or web address]  Office  [phone]  CC: Primary care physician; Ellamae Sia, MD

## 2015-04-04 LAB — CBC
HCT: 22.4 % — ABNORMAL LOW (ref 35.0–47.0)
Hemoglobin: 7.3 g/dL — ABNORMAL LOW (ref 12.0–16.0)
MCH: 28.3 pg (ref 26.0–34.0)
MCHC: 32.7 g/dL (ref 32.0–36.0)
MCV: 86.6 fL (ref 80.0–100.0)
Platelets: 203 10*3/uL (ref 150–440)
RBC: 2.59 MIL/uL — ABNORMAL LOW (ref 3.80–5.20)
RDW: 20.9 % — ABNORMAL HIGH (ref 11.5–14.5)
WBC: 5.9 10*3/uL (ref 3.6–11.0)

## 2015-04-04 LAB — RENAL FUNCTION PANEL
Albumin: 2.8 g/dL — ABNORMAL LOW (ref 3.5–5.0)
Anion gap: 8 (ref 5–15)
BUN: 10 mg/dL (ref 6–20)
CO2: 30 mmol/L (ref 22–32)
Calcium: 8.5 mg/dL — ABNORMAL LOW (ref 8.9–10.3)
Chloride: 95 mmol/L — ABNORMAL LOW (ref 101–111)
Creatinine, Ser: 4.94 mg/dL — ABNORMAL HIGH (ref 0.44–1.00)
GFR calc Af Amer: 10 mL/min — ABNORMAL LOW (ref >60)
GFR calc non Af Amer: 9 mL/min — ABNORMAL LOW (ref >60)
Glucose, Bld: 96 mg/dL (ref 65–99)
Phosphorus: 1.7 mg/dL — ABNORMAL LOW (ref 2.5–4.6)
Potassium: 4.9 mmol/L (ref 3.5–5.1)
Sodium: 133 mmol/L — ABNORMAL LOW (ref 135–145)

## 2015-04-04 LAB — GLUCOSE, CAPILLARY
Glucose-Capillary: 64 mg/dL — ABNORMAL LOW (ref 65–99)
Glucose-Capillary: 87 mg/dL (ref 65–99)
Glucose-Capillary: 70 mg/dL (ref 65–99)
Glucose-Capillary: 67 mg/dL (ref 65–99)
Glucose-Capillary: 69 mg/dL (ref 65–99)
Glucose-Capillary: 112 mg/dL — ABNORMAL HIGH (ref 65–99)
Glucose-Capillary: 99 mg/dL (ref 65–99)

## 2015-04-04 MED ORDER — RISAQUAD PO CAPS
1.0000 | ORAL_CAPSULE | Freq: Every day | ORAL | Status: DC
  Administered 2015-04-05: 1 via ORAL
  Filled 2015-04-04 (×2): qty 1

## 2015-04-04 MED ORDER — METOCLOPRAMIDE HCL 5 MG PO TABS
10.0000 mg | ORAL_TABLET | Freq: Three times a day (TID) | ORAL | Status: DC
  Administered 2015-04-04 – 2015-04-05 (×3): 10 mg via ORAL
  Filled 2015-04-04 (×4): qty 2

## 2015-04-04 NOTE — Progress Notes (Signed)
Machine check 

## 2015-04-04 NOTE — Care Management (Signed)
Physical therapy is recommending skilled nursing and patient is in agreement.  Notified [facility].  Patient gives permission to initiate a bed search.  Would not want to go to [facility] and would like a private room

## 2015-04-04 NOTE — Progress Notes (Signed)
Inpatient Diabetes Program Recommendations  AACE/ADA: New Consensus Statement on Inpatient Glycemic Control (2013)  Target Ranges:  Prepandial:   less than 140 mg/dL      Peak postprandial:   less than 180 mg/dL (1-2 hours)      Critically ill patients:  140 - 180 mg/dL   Results for KAHLEY, PINKERTON (MRN [id number]) as of 04/04/2015 08:32  Ref. Range 04/03/2015 07:19 04/03/2015 07:54 04/03/2015 09:10 04/03/2015 11:20 04/03/2015 16:07 04/03/2015 20:40 04/03/2015 23:55 04/04/2015 01:43 04/04/2015 05:21 04/04/2015 07:21  Glucose-Capillary Latest Ref Range: 65-99 mg/dL 37 (LL) 54 (L) 70 89 93 157 (H) 80 64 (L) 87 70    Diabetes history: DM2 Outpatient Diabetes medications: Levemir 8 units QHS, Novolog 2-6 units TID with meals Current orders for Inpatient glycemic control: Novolog 2-6 units TID with meals  Inpatient Diabetes Program Recommendations Insulin - Basal: Noted patient should be discharged today. May want to consider discontinuing Levemir as an outpatient since glucose has ranged from 37-157 mg/dl over the past 24 hours and A1C was 4.9% on 6//1/16. Insulin-Correction: Novolog 2-6 units TID with meals does not have any parameters to determine how much insulin should be given with meals. Patient has not received any Novolog in the past 24 hours. May also want to consider discontinuing Novolog correction or at least provide parameters for determining dosage to be given with meals.  Thanks, Barnie Alderman, RN, MSN, CCRN, CDE Diabetes Coordinator Inpatient Diabetes Program [phone] (Team Pager from [place] to [place]) [phone] ([place] office) [phone] [facility] office) [phone] [facility] office)

## 2015-04-04 NOTE — Progress Notes (Signed)
Subjective:  Patient seen during dialysis Tolerating well Reported nausea this morning Asking about problems with speech although it appears to be baseline for her  Objective:  Vital signs in last 24 hours:  Temp:  [97.7 F (36.5 C)-98.3 F (36.8 C)] 97.7 F (36.5 C) (07/13 1000) Pulse Rate:  [63-75] 73 (07/13 1130) Resp:  [14-22] 16 (07/13 1130) BP: (119-160)/(42-89) 160/84 mmHg (07/13 1130) SpO2:  [86 %-100 %] 97 % (07/13 1000) Weight:  [89 kg (196 lb 3.4 oz)] 89 kg (196 lb 3.4 oz) (07/13 1000)  Weight change:  Filed Weights   04/02/15 1315 04/02/15 1441 04/04/15 1000  Weight: 88.7 kg (195 lb 8.8 oz) 87.544 kg (193 lb) 89 kg (196 lb 3.4 oz)    Intake/Output: I/O last 3 completed shifts: In: 1680 [P.O.:1680] Out: 0      Physical Exam: General: NAD, laying in bed  HEENT Anicteric, moist mucus membranes  Neck supple  Pulm/lungs Clear b/l,   CVS/Heart Regular, no rub  Abdomen:  Soft, non tender  Extremities: Trace edema  Neurologic: Alert, oriented  Skin: No acute rashes  Access: AVG       Basic Metabolic Panel:  Recent Labs Lab 03/30/15 0846 04/01/15 1218 04/02/15 0444 04/02/15 1031 04/03/15 0539  NA 136 138 140  --   --   K 2.8* 3.1* 2.9*  --  3.9  CL 98* 96* 100*  --   --   CO2 31 30 32  --   --   GLUCOSE 89 99 <20*  --   --   BUN 9 14 16   --   --   CREATININE 2.94* 5.53* 6.11*  --   --   CALCIUM 8.1* 8.7* 8.7*  --   --   MG  --   --   --  2.1  --      CBC:  Recent Labs Lab 03/30/15 0846 04/01/15 1218 04/04/15 0952  WBC 6.6 4.8 5.9  NEUTROABS 3.5 2.8  --   HGB 8.0* 8.0* 7.3*  HCT 24.4* 24.6* 22.4*  MCV 84.9 86.0 86.6  PLT 169 190 203      Microbiology: Results for orders placed or performed during the hospital encounter of 02/19/15  Blood culture (routine x 2)     Status: None   Collection Time: 02/19/15 12:20 PM  Result Value Ref Range Status   Specimen Description BLOOD  Final   Special Requests Immunocompromised  Final   Culture NO GROWTH 5 DAYS  Final   Report Status 02/24/2015 FINAL  Final  Blood culture (routine x 2)     Status: None   Collection Time: 02/19/15 12:30 PM  Result Value Ref Range Status   Specimen Description BLOOD  Final   Special Requests Immunocompromised  Final   Culture NO GROWTH 5 DAYS  Final   Report Status 02/24/2015 FINAL  Final  Body fluid culture     Status: None   Collection Time: 02/20/15  2:55 PM  Result Value Ref Range Status   Specimen Description PLEURAL  Final   Special Requests NONE  Final   Gram Stain   Final    FEW WBC SEEN RARE RED BLOOD CELLS FEW GRAM VARIABLE ROD    Culture NO GROWTH 4 DAYS  Final   Report Status 02/24/2015 FINAL  Final  Body fluid culture     Status: None   Collection Time: 02/21/15  4:30 PM  Result Value Ref Range Status   Specimen Description PLEURAL  Final   Special Requests NONE  Final   Gram Stain MANY WBC SEEN NO ORGANISMS SEEN   Final   Culture NO GROWTH 3 DAYS  Final   Report Status 02/26/2015 FINAL  Final    Coagulation Studies: No results for input(s): LABPROT, INR in the last 72 hours.  Urinalysis: No results for input(s): COLORURINE, LABSPEC, PHURINE, GLUCOSEU, HGBUR, BILIRUBINUR, KETONESUR, PROTEINUR, UROBILINOGEN, NITRITE, LEUKOCYTESUR in the last 72 hours.  Invalid input(s): APPERANCEUR    Imaging: No results found.   Medications:     . albuterol  3 mL Inhalation Q6H  . amLODipine  10 mg Oral Daily  . aspirin EC  81 mg Oral Daily  . atorvastatin  40 mg Oral Daily  . buPROPion  150 mg Oral BID  . carvedilol  25 mg Oral BID  . cholecalciferol  800 Units Oral Daily  . citalopram  20 mg Oral Daily  . clopidogrel  75 mg Oral Daily  . dextrose  1 ampule Intravenous Once  . docusate sodium  100 mg Oral BID  . epoetin (EPOGEN/PROCRIT) injection  4,000 Units Subcutaneous Q M,W,F-HD  . famotidine  20 mg Oral Daily  . gabapentin  100 mg Oral Daily  . heparin subcutaneous  5,000 Units Subcutaneous Q12H  .  hydrALAZINE  100 mg Oral TID  . insulin aspart  2-6 Units Subcutaneous TID WC  . lipase/protease/amylase  24,000 Units Oral BID BM  . lipase/protease/amylase  48,000 Units Oral TID WC  . loratadine  10 mg Oral Daily  . metoCLOPramide  5 mg Oral TID AC & HS  . omega-3 acid ethyl esters  2 g Oral Daily  . pantoprazole  40 mg Oral Daily  . ranolazine  1,000 mg Oral BID  . sevelamer carbonate  800 mg Oral TID WC  . sucralfate  1 g Oral TID WC & HS   acetaminophen **OR** acetaminophen, ALPRAZolam, HYDROcodone-acetaminophen, nitroGLYCERIN, ondansetron (ZOFRAN) IV, ondansetron, senna, sodium chloride  Assessment/ Plan:  54 y.o. AA female with ESRD, DM (30 yrs with retinopathy, neuropathy), OA, IBS, hyperlipidemia, h/o GI bleed, SHPTH, AOCD, diastolic heart failure , RCA stent Feb 2013, C. diff colitis 7/14, clotted RUE AVF 10/14, admission for shortness of breath 02/20/15. Abdominal pain N/V June 14, July 11  1. ESRD. [facility]. MWF 1st shift 2. AOCKD- contd on EPO. Goal to keep ~  9-11  3. SHPTH . Monitor phos 4. Nausea and Vomiting- likely gastroparesis Plan: Patient seen during dialysis Tolerating well  EPO with HD Overall improved  Skilled nursing home discharge planned     LOS: 3 Tinita Brooker 7/13/201611:43 AM

## 2015-04-04 NOTE — Clinical Social Work Note (Signed)
CSW notified pt of her placement options for SNF.  CSW will f/u tomorrow for pt's choice.

## 2015-04-04 NOTE — Progress Notes (Signed)
[facility] [place] NAME: [name]    MR#:  [id number]  DATE OF BIRTH:  August 23, 1961  SUBJECTIVE:  CHIEF COMPLAINT:   Chief Complaint  Patient presents with  . Abdominal Pain  . Joint Pain  . Headache  . Nausea   Patient is 54 year old female with history of end-stage renal disease who underwent hemodialysis on Friday presents to the hospital with nausea, vomiting, abdominal pain as well as hypoxia. Her chest x-ray in the emergency room revealed cardiomegaly, pulmonary vascular congestion and a small to moderate bilateral pleural effusions. The patient underwent hemodialysis with 2 L of fluid removal yesterday 11th of July 2016, hemodialysis today on 04/04/2015 as well.  Post hemodialysis she felt satisfactory. Her oxygenation is getting better and now she is off oxygen completely. Nausea and vomiting done today, also some diarrhea. No blood. Hemoglobin level 7.3 today  Review of Systems  Constitutional: Negative for fever, chills and weight loss.  HENT: Negative for congestion.   Eyes: Negative for blurred vision and double vision.  Respiratory: Positive for shortness of breath. Negative for cough, sputum production and wheezing.   Cardiovascular: Negative for chest pain, palpitations, orthopnea, leg swelling and PND.  Gastrointestinal: Positive for nausea and abdominal pain. Negative for vomiting, diarrhea, constipation and blood in stool.  Genitourinary: Negative for dysuria, urgency, frequency and hematuria.  Musculoskeletal: Negative for falls.  Neurological: Negative for dizziness, tremors, focal weakness and headaches.  Endo/Heme/Allergies: Does not bruise/bleed easily.  Psychiatric/Behavioral: Negative for depression. The patient does not have insomnia.     VITAL SIGNS: Blood pressure 161/77, pulse 79, temperature 97.8 F (36.6 C), temperature source Oral, resp. rate 14, height 5\' 5"  (1.651 m), weight 86.7 kg (191 lb 2.2 oz),  SpO2 97 %.  PHYSICAL EXAMINATION:   GENERAL:  54 y.o.-year-old patient lying in the bed  In mild-to-moderate distress due to epigastric and upper abdominal discomfort, pain. Dry oral mucosa EYES: Pupils equal, round, reactive to light and accommodation. No scleral icterus. Extraocular muscles intact.  HEENT: Head atraumatic, normocephalic. Oropharynx and nasopharynx clear.  NECK:  Supple, no jugular venous distention. No thyroid enlargement, no tenderness.  LUNGS: Normal breath sounds bilaterally. No use of accessory muscles of respiration.  CARDIOVASCULAR: S1, S2 normal. No murmurs, rubs, or gallops.  ABDOMEN: Soft, tender in all upper abdomen with some voluntary guarding, but no rebound, nondistended. Bowel sounds present. No organomegaly or mass.  EXTREMITIES: No pedal edema, cyanosis, or clubbing.  NEUROLOGIC: Cranial nerves II through XII are intact. Muscle strength 5/5 in all extremities. Sensation intact. Gait not checked.  PSYCHIATRIC: The patient is alert and oriented x 3.  SKIN: No obvious rash, lesion, or ulcer.   ORDERS/RESULTS REVIEWED:   CBC  Recent Labs Lab 03/30/15 0846 04/01/15 1218 04/04/15 0952  WBC 6.6 4.8 5.9  HGB 8.0* 8.0* 7.3*  HCT 24.4* 24.6* 22.4*  PLT 169 190 203  MCV 84.9 86.0 86.6  MCH 27.8 27.8 28.3  MCHC 32.7 32.3 32.7  RDW 19.9* 19.9* 20.9*  LYMPHSABS 2.2 1.5  --   MONOABS 0.6 0.3  --   EOSABS 0.3 0.2  --   BASOSABS 0.0 0.0  --    ------------------------------------------------------------------------------------------------------------------  Chemistries   Recent Labs Lab 03/30/15 0846 04/01/15 1218 04/02/15 0444 04/02/15 1031 04/03/15 0539 04/04/15 0925  NA 136 138 140  --   --  133*  K 2.8* 3.1* 2.9*  --  3.9 4.9  CL 98* 96* 100*  --   --  95*  CO2 31 30 32  --   --  30  GLUCOSE 89 99 <20*  --   --  96  BUN 9 14 16   --   --  10  CREATININE 2.94* 5.53* 6.11*  --   --  4.94*  CALCIUM 8.1* 8.7* 8.7*  --   --  8.5*  MG  --   --    --  2.1  --   --   AST 20 22 19   --   --   --   ALT 12* 12* 13*  --   --   --   ALKPHOS 104 111 119  --   --   --   BILITOT 0.6 0.8 0.5  --   --   --    ------------------------------------------------------------------------------------------------------------------ estimated creatinine clearance is 14 mL/min (by C-G formula based on Cr of 4.94). ------------------------------------------------------------------------------------------------------------------ No results for input(s): TSH, T4TOTAL, T3FREE, THYROIDAB in the last 72 hours.  Invalid input(s): FREET3  Cardiac Enzymes  Recent Labs Lab 04/01/15 1218  TROPONINI <0.03   ------------------------------------------------------------------------------------------------------------------ Invalid input(s): POCBNP ---------------------------------------------------------------------------------------------------------------  RADIOLOGY: No results found.  EKG:  Orders placed or performed during the hospital encounter of 04/01/15  . ED EKG   (only if pt is 56 y.o. or older and pain is above umbilicus)  . ED EKG   (only if pt is 56 y.o. or older and pain is above umbilicus)  . EKG 12-Lead  . EKG 12-Lead    ASSESSMENT AND PLAN:  Principal Problem:   Acute respiratory failure with hypoxia Active Problems:   Acute pulmonary edema   Nausea and vomiting   Hypoglycemia associated with diabetes   Pressure ulcer   Anemia of chronic disease   Secondary hyperparathyroidism *. Acute respiratory failure with hypoxia due to acute pulmonary edema, status post hemodialysis with 2 L of fluid removal on 04/02/2015, repeated hemodialysis today on April 04 2015. Patient is off oxygen at present. Continue ambulating following oxygen levels on exertion.  *. Nausea and vomiting, suspected gastroparesis exacerbation, continue patient on PPIs , deescalate diet again to clear liquid diet and pain. Reinstate Reglan *Hypoglycemia, resume Lantus  when patient is taking good PO, blood glucose level is in 70s today * Hypokalemia, supplemented orally, resolved , magnesium level was normal at 2.1 *. Anemia, likely anemia of chronic disease, of CK D. Patient is receiving Epogen with hemodialysis. Following as outpatient *Generalized weakness. Physical therapist recommended interpretation placement care management is consulted for placement arrangements.  Discussed with patient extensively, voices understanding. All questions answered Diarrhea, possibly bacterial overgrowth. Initiate patient on acidophilus, get stool cultures including for C. difficile     Management plans discussed with the patient, family and they are in agreement.   DRUG ALLERGIES:  Allergies  Allergen Reactions  . Ace Inhibitors Swelling  . Codeine Nausea And Vomiting  . Compazine [Prochlorperazine Edisylate] Nausea And Vomiting  . Losartan Other (See Comments)  . Phenergan [Promethazine Hcl] Other (See Comments)    Hallucinations  . Zofran [Ondansetron Hcl] Other (See Comments)    Dementia sx  . Oxycodone Anxiety  . Tape Rash    CODE STATUS:     Code Status Orders        Start     Ordered   04/01/15 1555  Full code   Continuous     04/01/15 1558    Advance Directive Documentation        Most Recent Value   Type of Advance  Directive  Living will   Pre-existing out of facility DNR order (yellow form or pink MOST form)     "MOST" Form in Place?        TOTAL TIME TAKING CARE OF THIS PATIENT: 27minutes.    Theodoro Grist M.D on 04/04/2015 at 2:18 PM  Between 7am to 6pm - Pager - [phone]  After 6pm go to [email or web address] - password [email or web address] [facility]  Office  [phone]  CC: Primary care physician; Ellamae Sia, MD

## 2015-04-05 ENCOUNTER — Encounter: Payer: Self-pay | Admitting: Internal Medicine

## 2015-04-05 DIAGNOSIS — K3184 Gastroparesis: Secondary | ICD-10-CM

## 2015-04-05 DIAGNOSIS — E1143 Type 2 diabetes mellitus with diabetic autonomic (poly)neuropathy: Secondary | ICD-10-CM

## 2015-04-05 DIAGNOSIS — R531 Weakness: Secondary | ICD-10-CM

## 2015-04-05 DIAGNOSIS — E876 Hypokalemia: Secondary | ICD-10-CM

## 2015-04-05 DIAGNOSIS — R5381 Other malaise: Secondary | ICD-10-CM

## 2015-04-05 DIAGNOSIS — I5031 Acute diastolic (congestive) heart failure: Secondary | ICD-10-CM

## 2015-04-05 DIAGNOSIS — I5033 Acute on chronic diastolic (congestive) heart failure: Secondary | ICD-10-CM

## 2015-04-05 HISTORY — DX: Acute diastolic (congestive) heart failure: I50.31

## 2015-04-05 LAB — GLUCOSE, CAPILLARY
Glucose-Capillary: 67 mg/dL (ref 65–99)
Glucose-Capillary: 104 mg/dL — ABNORMAL HIGH (ref 65–99)
Glucose-Capillary: 83 mg/dL (ref 65–99)
Glucose-Capillary: 103 mg/dL — ABNORMAL HIGH (ref 65–99)
Glucose-Capillary: 98 mg/dL (ref 65–99)

## 2015-04-05 MED ORDER — RISAQUAD PO CAPS: 1.0000 | ORAL_CAPSULE | Freq: Every day | ORAL | Status: DC

## 2015-04-05 NOTE — Progress Notes (Signed)
Have attempted 3 times to call [facility] to give report and have been on hold with a recording for a period of time each call and no one answering. Patient has been picked up by EMS and is on the way to the facility.

## 2015-04-05 NOTE — Care Management Important Message (Signed)
Important Message  Patient Details  Name: [name] MRN: [id number] Date of Birth: 07-Dec-1960   Medicare Important Message Given:  Yes-third notification given    [name] A Allmond 04/05/2015, 9:46 AM

## 2015-04-05 NOTE — Clinical Social Work Placement (Signed)
   CLINICAL SOCIAL WORK PLACEMENT  NOTE  Date:  04/05/2015  Patient Details  Name: [name] MRN: [id number] Date of Birth: 03/30/1961  Clinical Social Work is seeking post-discharge placement for this patient at the [facility] level of care (*CSW will initial, date and re-position this form in  chart as items are completed):  Yes   Patient/family provided with [facility] Work Department's list of facilities offering this level of care within the geographic area requested by the patient (or if unable, by the patient's family).  Yes   Patient/family informed of their freedom to choose among providers that offer the needed level of care, that participate in Medicare, Medicaid or managed care program needed by the patient, have an available bed and are willing to accept the patient.  Yes   Patient/family informed of [facility]'s ownership interest in [facility] and [facility], as well as of the fact that they are under no obligation to receive care at these facilities.  PASRR submitted to EDS on 04/04/15     PASRR number received on 04/04/15     Existing PASRR number confirmed on       FL2 transmitted to all facilities in geographic area requested by pt/family on 04/04/15     FL2 transmitted to all facilities within larger geographic area on       Patient informed that his/her managed care company has contracts with or will negotiate with certain facilities, including the following:   (pt was given SNF packet with this inforamation on it)     Yes   Patient/family informed of bed offers received.  Patient chooses bed at [facility]     Physician recommends and patient chooses bed at      Patient to be transferred to [facility] on  .  Patient to be transferred to facility by       Patient family notified on   of transfer.  Name of family member notified:        PHYSICIAN       Additional Comment:     _______________________________________________ Mathews Argyle, LCSW 04/05/2015, 10:18 AM

## 2015-04-05 NOTE — Progress Notes (Signed)
Expand All Collapse All   [facility] [place] NAME: [name]   MR#: [id number]  DATE OF BIRTH: Nov 11, 1960  DATE OF ADMISSION: 7/10/2016ADMITTING PHYSICIAN: Epifanio Lesches, MD  DATE OF DISCHARGE: 04/03/2015  PRIMARY CARE PHYSICIAN: Ellamae Sia, MD     ADMISSION DIAGNOSIS:  Hypoxia [R09.02] ESRD (end stage renal disease) [N18.6] Pulmonary vascular congestion [R09.89]  DISCHARGE DIAGNOSIS:  Principal Problem:  Acute respiratory failure with hypoxia Active Problems:  Acute pulmonary edema  Nausea and vomiting  Hypoglycemia associated with diabetes  Pressure ulcer  Anemia of chronic disease  Secondary hyperparathyroidism   SECONDARY DIAGNOSIS:   Past Medical History  Diagnosis Date  . Asthma   . Collagen vascular disease   . Diabetes mellitus without complication   . Hypertension   . Coronary artery disease     a. cath 2013: stenting to RCA (report not available); b. cath 2014: LM nl, pLAD 40%, mLAD nl, ost LCx 40%, mid LCx nl, pRCA 30% @ site of prior stent, mRCA 50%  . Diabetic neuropathy   . ESRD (end stage renal disease) on dialysis     M-W-F  . GERD (gastroesophageal reflux disease)   . Hx of pancreatitis 2015  . Myocardial infarction   . Pneumonia   . Mitral regurgitation     a. echo 10/2013: EF 62%, noWMA, mildly dilated LA, mild to mod MR/TR, GR1DD  . COPD (chronic obstructive pulmonary disease)     .pro HOSPITAL COURSE:   Patient is 54 year old African-American female with history of end-stage renal disease who presents to the hospital with complaints of nausea, vomiting, abdominal pain and hypoxia. Her labs revealed hypokalemia with potassium level of 2.8 in the emergency room. Her chest x-ray showed pulmonary vascular congestion as well as small to moderate bilateral pleural effusions and a basal atelectasis. She was  admitted to the hospital for further evaluation. She was initiated on liquid diet as well as conservative therapy with antiemetics. She was consulted by nephrologist and hemodialysis was performed on 04/02/2015 withdrawing 2 L of fluid. After hemodialysis. Patient felt satisfactory and her oxygenation improved. She was weaned down to room air and her oxygen saturations remained at 96%. In regards to abdominal pain and nausea, vomiting. It was felt that patient's nausea, vomiting and abdominal pain was related to gastroparesis with conservative management, as well as stool softeners and bowel movement 04/02/2015 patient's condition improved. Her diet was advanced and she was able to eat regular diet prior to discharge from the hospital Discussion by problem *. Acute respiratory failure with hypoxia due to acute pulmonary edema, acute diastolic CHF , status post hemodialysis with 2 L of fluid removal on 04/02/2015, also hemodialysis on 04/04/2015 , weaning off oxygen to room air. Checking oxygen saturations on exertion. Continue hemodialysis as per prior schedule *. Nausea and vomiting, likely gastroparesis exacerbation, evaluated by gastroenterologist recently and had EGD , patient is to continue on PPIs and Carafate, continue hemodialysis diet as tolerated, preferably small amount of food frequently during the daytime , unfortunately, still poorly tolerance of even soft diet *Hypoglycemia, discontinue Lantus , continue sliding scale insulin for now, advance patient's diabetic medications if oral intake improves as outpatient * Hypokalemia, supplemented orally, potassium level normalized, magnesium level was normal *. Anemia, likely anemia of chronic disease. Patient is receiving Epogen with hemodialysis, to be continued on the same * Hypertension, essential. Continue outpatient medications. Blood pressure is well controlled, although care should be  given to hydralazine administration as hydralazine itself  can cause nausea episodes Patient was not discharged home since at physical therapist who evaluated her in the hospital felt that she would benefit from skilled nursing facility placement for rehabilitation short-term. Management was consulted and skilled nursing facility bed was found. It is felt that patient should be discharged today on 04/05/2015. As long as she is able to tolerate full liquid diet. Periodically while in the hospital, she was still having episodes of nausea and vomiting and abdominal pain in epigastric area, which are chronic. It was felt that patient's diet should be diabetic hemodialysis diet given up to 6 times a day. Small amount of food, to be advanced as tolerated to soft diet. Patient is to follow up with gastroenterologist as outpatient. It is also recommended to follow and determine if in fact, and hydralazine could have been playing a role regarding to patient's nausea and vomiting.  DISCHARGE CONDITIONS:   DRUG ALLERGIES:   Allergies  Allergen Reactions  . Ace Inhibitors Swelling  . Codeine Nausea And Vomiting  . Compazine [Prochlorperazine Edisylate] Nausea And Vomiting  . Losartan Other (See Comments)  . Phenergan [Promethazine Hcl] Other (See Comments)    Hallucinations  . Zofran [Ondansetron Hcl] Other (See Comments)    Dementia sx  . Oxycodone Anxiety  . Tape Rash    DISCHARGE MEDICATIONS:   Current Discharge Medication List    START taking these medications   Details  docusate sodium (COLACE) 100 MG capsule Take 1 capsule (100 mg total) by mouth 2 (two) times daily. Qty: 10 capsule, Refills: 0    senna (SENOKOT) 8.6 MG TABS tablet Take 1 tablet (8.6 mg total) by mouth daily as needed for mild constipation. Qty: 120 each, Refills: 0      CONTINUE these medications which have NOT CHANGED   Details  albuterol (PROVENTIL HFA;VENTOLIN HFA) 108 (90 BASE) MCG/ACT inhaler Inhale 4-6 puffs by  mouth every 4 hours as needed for wheezing, cough, and/or shortness of breath Qty: 1 Inhaler, Refills: 1    ALPRAZolam (XANAX) 0.25 MG tablet Take 1 tablet (0.25 mg total) by mouth 2 (two) times daily as needed for anxiety. Qty: 60 tablet, Refills: 0    amLODipine (NORVASC) 10 MG tablet Take 10 mg by mouth daily.    aspirin EC 81 MG tablet Take 81 mg by mouth daily.    atorvastatin (LIPITOR) 40 MG tablet Take 40 mg by mouth daily.    buPROPion (WELLBUTRIN SR) 150 MG 12 hr tablet Take 150 mg by mouth 2 (two) times daily.    carvedilol (COREG) 25 MG tablet Take 25 mg by mouth 2 (two) times daily.    cetirizine (ZYRTEC) 10 MG tablet Take 10 mg by mouth daily.    citalopram (CELEXA) 20 MG tablet Take 20 mg by mouth daily.    clopidogrel (PLAVIX) 75 MG tablet Take 75 mg by mouth daily.    gabapentin (NEURONTIN) 100 MG capsule Take 100 mg by mouth daily.    hydrALAZINE (APRESOLINE) 100 MG tablet Take 1 tablet (100 mg total) by mouth 3 (three) times daily. Qty: 90 tablet, Refills: 1    HYDROcodone-acetaminophen (NORCO/VICODIN) 5-325 MG per tablet Take 1 tablet by mouth every 6 (six) hours as needed for moderate pain. Qty: 20 tablet, Refills: 0    hydrocortisone 1 % ointment Apply 1 application topically 2 (two) times daily.    insulin aspart (NOVOLOG) 100 UNIT/ML injection Inject 2-6 Units into the skin 3 (three)  times daily with meals. Pt uses as needed per sliding scale.    insulin detemir (LEVEMIR) 100 UNIT/ML injection Inject 8 Units into the skin at bedtime.    lipase/protease/amylase (CREON) 12000 UNITS CPEP capsule Take 24,000-48,000 Units by mouth 5 (five) times daily. Pt takes four capsules with meals and two capsules with snacks.    metoCLOPramide (REGLAN) 5 MG tablet Take 1 tablet (5 mg total) by mouth 4 (four) times daily - before meals and at bedtime. Qty: 40 tablet, Refills: 0    nitroGLYCERIN (NITROSTAT) 0.4 MG SL  tablet Place 0.4 mg under the tongue every 5 (five) minutes x 3 doses as needed for chest pain.     Omega-3 Fatty Acids (FISH OIL) 1000 MG CAPS Take 1 capsule by mouth 2 (two) times daily.    ondansetron (ZOFRAN) 4 MG tablet Take 1 tablet (4 mg total) by mouth daily as needed for nausea or vomiting. Qty: 20 tablet, Refills: 1    pantoprazole (PROTONIX) 40 MG tablet Take 1 tablet (40 mg total) by mouth 2 (two) times daily before a meal. Qty: 60 tablet, Refills: 0    ranitidine (ZANTAC) 150 MG tablet Take 150 mg by mouth 2 (two) times daily.    ranolazine (RANEXA) 1000 MG SR tablet Take 1,000 mg by mouth 2 (two) times daily.     sevelamer carbonate (RENVELA) 800 MG tablet Take 800 mg by mouth 3 (three) times daily with meals.    sucralfate (CARAFATE) 1 G tablet Take 1 tablet (1 g total) by mouth 4 (four) times daily - with meals and at bedtime. Qty: 120 tablet, Refills: 0    Vitamin D, Cholecalciferol, 400 UNITS TABS Take 400 Units by mouth 2 (two) times daily.          DISCHARGE INSTRUCTIONS:      Today   CHIEF COMPLAINT:   Chief Complaint  Patient presents with  . Abdominal Pain  . Joint Pain  . Headache  . Nausea    HISTORY OF PRESENT ILLNESS:   VITAL SIGNS:  Blood pressure 118/77, pulse 73, temperature 98.6 F (37 C), temperature source Oral, resp. rate 18, height 5\' 5"  (1.651 m), weight 87.544 kg (193 lb), SpO2 95 %.  I/O:   Intake/Output Summary (Last 24 hours) at 04/03/15 1506 Last data filed at 04/03/15 1130  Gross per 24 hour  Intake  1920 ml  Output  0 ml  Net  1920 ml    PHYSICAL EXAMINATION:  GENERAL: 54 y.o.-year-old patient lying in the bed with no acute distress.  EYES: Pupils equal, round, reactive to light and accommodation. No scleral icterus. Extraocular muscles intact.  HEENT: Head atraumatic, normocephalic. Oropharynx and nasopharynx clear.  NECK: Supple, no  jugular venous distention. No thyroid enlargement, no tenderness.  LUNGS: Normal breath sounds bilaterally, no wheezing, rales,rhonchi or crepitation. No use of accessory muscles of respiration.  CARDIOVASCULAR: S1, S2 normal. No murmurs, rubs, or gallops.  ABDOMEN: Soft, minimal if any epigastric tenderness, minimal distention. Bowel sounds present. No organomegaly or mass.  EXTREMITIES: No pedal edema, cyanosis, or clubbing.  NEUROLOGIC: Cranial nerves II through XII are intact. Muscle strength 5/5 in all extremities. Sensation intact. Gait not checked.  PSYCHIATRIC: The patient is alert and oriented x 3.  SKIN: No obvious rash, lesion, or ulcer.   DATA REVIEW:   CBC  Last Labs      Recent Labs Lab 04/01/15 1218  WBC 4.8  HGB 8.0*  HCT 24.6*  PLT 190  Chemistries   Last Labs      Recent Labs Lab 04/02/15 0444 04/02/15 1031 04/03/15 0539  NA 140 --  --   K 2.9* --  3.9  CL 100* --  --   CO2 32 --  --   GLUCOSE <20* --  --   BUN 16 --  --   CREATININE 6.11* --  --   CALCIUM 8.7* --  --   MG --  2.1 --   AST 19 --  --   ALT 13* --  --   ALKPHOS 119 --  --   BILITOT 0.5 --  --       Cardiac Enzymes  Last Labs      Recent Labs Lab 04/01/15 1218  TROPONINI <0.03      Microbiology Results  Results for orders placed or performed during the hospital encounter of 02/19/15  Blood culture (routine x 2) Status: None   Collection Time: 02/19/15 12:20 PM  Result Value Ref Range Status   Specimen Description BLOOD  Final   Special Requests Immunocompromised  Final   Culture NO GROWTH 5 DAYS  Final   Report Status 02/24/2015 FINAL  Final  Blood culture (routine x 2) Status: None   Collection Time: 02/19/15 12:30 PM  Result Value Ref Range Status   Specimen Description BLOOD  Final   Special Requests  Immunocompromised  Final   Culture NO GROWTH 5 DAYS  Final   Report Status 02/24/2015 FINAL  Final  Body fluid culture Status: None   Collection Time: 02/20/15 2:55 PM  Result Value Ref Range Status   Specimen Description PLEURAL  Final   Special Requests NONE  Final   Gram Stain   Final    FEW WBC SEEN RARE RED BLOOD CELLS FEW GRAM VARIABLE ROD    Culture NO GROWTH 4 DAYS  Final   Report Status 02/24/2015 FINAL  Final  Body fluid culture Status: None   Collection Time: 02/21/15 4:30 PM  Result Value Ref Range Status   Specimen Description PLEURAL  Final   Special Requests NONE  Final   Gram Stain MANY WBC SEEN NO ORGANISMS SEEN   Final   Culture NO GROWTH 3 DAYS  Final   Report Status 02/26/2015 FINAL  Final    RADIOLOGY:   Imaging Results (Last 48 hours)    No results found.    EKG:   Orders placed or performed during the hospital encounter of 04/01/15  . ED EKG (only if pt is 56 y.o. or older and pain is above umbilicus)  . ED EKG (only if pt is 56 y.o. or older and pain is above umbilicus)  . EKG 12-Lead  . EKG 12-Lead      Management plans discussed with the patient, family and they are in agreement.  CODE STATUS:     Code Status Orders        Start   Ordered   04/01/15 1555  Full code Continuous    04/01/15 1558    Advance Directive Documentation      Most Recent Value   Type of Advance Directive  Living will   Pre-existing out of facility DNR order (yellow form or pink MOST form)     "MOST" Form in Place?        TOTAL TIME TAKING CARE OF THIS PATIENT: 50 minutes.    Theodoro Grist M.D on 04/03/2015 at 3:06 PM  Between 7am to 6pm - Pager - [phone]  After  6pm go to [email or web address] - password [email or web address] [facility]  Office [phone]  CC: Primary care physician;  Ellamae Sia, MD          Revision History     Date/Time User Provider Type Action   04/05/2015 11:11 AM Theodoro Grist, MD Physician Addend   04/03/2015 3:15 PM Theodoro Grist, MD Physician Sign   View Details Report       Routing History     Date/Time From To Method   04/05/2015 11:11 AM Theodoro Grist, MD [name], MD In Basket

## 2015-04-05 NOTE — Progress Notes (Signed)
Subjective:  Patient states that she vomited twice this morning Overall appears better as compared to yesterday on the day before Reports midepigastric abdominal pain  Objective:  Vital signs in last 24 hours:  Temp:  [97.8 F (36.6 C)-98.4 F (36.9 C)] 98.2 F (36.8 C) (07/14 0457) Pulse Rate:  [62-79] 73 (07/14 0908) Resp:  [14-23] 18 (07/14 0908) BP: (126-179)/(46-87) 146/60 mmHg (07/14 0908) SpO2:  [95 %-100 %] 100 % (07/14 0908) Weight:  [86.7 kg (191 lb 2.2 oz)] 86.7 kg (191 lb 2.2 oz) (07/13 1404)  Weight change:  Filed Weights   04/02/15 1441 04/04/15 1000 04/04/15 1404  Weight: 87.544 kg (193 lb) 89 kg (196 lb 3.4 oz) 86.7 kg (191 lb 2.2 oz)    Intake/Output: I/O last 3 completed shifts: In: 66 [P.O.:820] Out: 2000 [Other:2000]     Physical Exam: General: NAD, laying in bed  HEENT Anicteric, moist mucus membranes  Neck supple  Pulm/lungs Clear b/l,   CVS/Heart Regular, no rub  Abdomen:  Soft, mild midepigastric tenderness   Extremities: Trace edema  Neurologic: Alert, oriented  Skin: No acute rashes  Access: AVG       Basic Metabolic Panel:  Recent Labs Lab 03/30/15 0846 04/01/15 1218 04/02/15 0444 04/02/15 1031 04/03/15 0539 04/04/15 0925  NA 136 138 140  --   --  133*  K 2.8* 3.1* 2.9*  --  3.9 4.9  CL 98* 96* 100*  --   --  95*  CO2 31 30 32  --   --  30  GLUCOSE 89 99 <20*  --   --  96  BUN 9 14 16   --   --  10  CREATININE 2.94* 5.53* 6.11*  --   --  4.94*  CALCIUM 8.1* 8.7* 8.7*  --   --  8.5*  MG  --   --   --  2.1  --   --   PHOS  --   --   --   --   --  1.7*     CBC:  Recent Labs Lab 03/30/15 0846 04/01/15 1218 04/04/15 0952  WBC 6.6 4.8 5.9  NEUTROABS 3.5 2.8  --   HGB 8.0* 8.0* 7.3*  HCT 24.4* 24.6* 22.4*  MCV 84.9 86.0 86.6  PLT 169 190 203      Microbiology: Results for orders placed or performed during the hospital encounter of 02/19/15  Blood culture (routine x 2)     Status: None   Collection Time:  02/19/15 12:20 PM  Result Value Ref Range Status   Specimen Description BLOOD  Final   Special Requests Immunocompromised  Final   Culture NO GROWTH 5 DAYS  Final   Report Status 02/24/2015 FINAL  Final  Blood culture (routine x 2)     Status: None   Collection Time: 02/19/15 12:30 PM  Result Value Ref Range Status   Specimen Description BLOOD  Final   Special Requests Immunocompromised  Final   Culture NO GROWTH 5 DAYS  Final   Report Status 02/24/2015 FINAL  Final  Body fluid culture     Status: None   Collection Time: 02/20/15  2:55 PM  Result Value Ref Range Status   Specimen Description PLEURAL  Final   Special Requests NONE  Final   Gram Stain   Final    FEW WBC SEEN RARE RED BLOOD CELLS FEW GRAM VARIABLE ROD    Culture NO GROWTH 4 DAYS  Final   Report  Status 02/24/2015 FINAL  Final  Body fluid culture     Status: None   Collection Time: 02/21/15  4:30 PM  Result Value Ref Range Status   Specimen Description PLEURAL  Final   Special Requests NONE  Final   Gram Stain MANY WBC SEEN NO ORGANISMS SEEN   Final   Culture NO GROWTH 3 DAYS  Final   Report Status 02/26/2015 FINAL  Final    Coagulation Studies: No results for input(s): LABPROT, INR in the last 72 hours.  Urinalysis: No results for input(s): COLORURINE, LABSPEC, PHURINE, GLUCOSEU, HGBUR, BILIRUBINUR, KETONESUR, PROTEINUR, UROBILINOGEN, NITRITE, LEUKOCYTESUR in the last 72 hours.  Invalid input(s): APPERANCEUR    Imaging: No results found.   Medications:     . acidophilus  1 capsule Oral Daily  . albuterol  3 mL Inhalation Q6H  . amLODipine  10 mg Oral Daily  . aspirin EC  81 mg Oral Daily  . atorvastatin  40 mg Oral Daily  . buPROPion  150 mg Oral BID  . carvedilol  25 mg Oral BID  . cholecalciferol  800 Units Oral Daily  . citalopram  20 mg Oral Daily  . clopidogrel  75 mg Oral Daily  . dextrose  1 ampule Intravenous Once  . docusate sodium  100 mg Oral BID  . epoetin (EPOGEN/PROCRIT)  injection  4,000 Units Subcutaneous Q M,W,F-HD  . famotidine  20 mg Oral Daily  . gabapentin  100 mg Oral Daily  . heparin subcutaneous  5,000 Units Subcutaneous Q12H  . hydrALAZINE  100 mg Oral TID  . insulin aspart  2-6 Units Subcutaneous TID WC  . lipase/protease/amylase  24,000 Units Oral BID BM  . lipase/protease/amylase  48,000 Units Oral TID WC  . loratadine  10 mg Oral Daily  . metoCLOPramide  10 mg Oral TID AC & HS  . omega-3 acid ethyl esters  2 g Oral Daily  . pantoprazole  40 mg Oral Daily  . ranolazine  1,000 mg Oral BID  . sevelamer carbonate  800 mg Oral TID WC  . sucralfate  1 g Oral TID WC & HS   acetaminophen **OR** acetaminophen, ALPRAZolam, HYDROcodone-acetaminophen, nitroGLYCERIN, ondansetron (ZOFRAN) IV, ondansetron, senna, sodium chloride  Assessment/ Plan:  54 y.o. AA female with ESRD, DM (30 yrs with retinopathy, neuropathy), OA, IBS, hyperlipidemia, h/o GI bleed, SHPTH, AOCD, diastolic heart failure , RCA stent Feb 2013, C. diff colitis 7/14, clotted RUE AVF 10/14, admission for shortness of breath 02/20/15. Abdominal pain N/V June 14, July 11  1. ESRD. [place]. MWF 1st shift 2. AOCKD- contd on EPO. Goal to keep ~  9-11  3. SHPTH . Monitor phos 4. Nausea and Vomiting- likely gastroparesis Plan: Next hemodialysis tomorrow  EPO with HD Overall improved  Skilled nursing home discharge planned     LOS: 4 [name] 7/14/201611:36 AM

## 2015-04-05 NOTE — Progress Notes (Signed)
Discharge instructions given to patient and social worker gave patient documents as well. Patient requesting proof that she has been in the hospital due to her court date that is scheduled for tomorrow. Social works documents are sufficient. Packet has been prepared and EMS has been called. IV and tele discontinued. Patient is dressed and ready to go. Attempting to contact [facility] to give report x2, no answer. Will attempt again.

## 2015-04-05 NOTE — Progress Notes (Signed)
[facility] NAME: [name]    MR#:  [id number]  DATE OF BIRTH:  [birth year]  SUBJECTIVE:  CHIEF COMPLAINT:   Chief Complaint  Patient presents with  . Abdominal Pain  . Joint Pain  . Headache  . Nausea   Patient is 54 year old female with history of end-stage renal disease who underwent hemodialysis on Friday presents to the hospital with nausea, vomiting, abdominal pain as well as hypoxia. Her chest x-ray in the emergency room revealed cardiomegaly, pulmonary vascular congestion and a small to moderate bilateral pleural effusions. The patient underwent hemodialysis with 2 L of fluid removal yesterday 11th of July 2016, hemodialysis today on 04/04/2015 as well.  Post hemodialysis she felt satisfactory. Her oxygenation is getting better and now she is off oxygen intermittently. Nausea and vomiting  intermittently recurrent , has nausea and vomiting again today as well as abdominal pain in the upper abdomen  Review of Systems  Constitutional: Negative for fever, chills and weight loss.  HENT: Negative for congestion.   Eyes: Negative for blurred vision and double vision.  Respiratory: Positive for shortness of breath. Negative for cough, sputum production and wheezing.   Cardiovascular: Negative for chest pain, palpitations, orthopnea, leg swelling and PND.  Gastrointestinal: Positive for nausea and abdominal pain. Negative for vomiting, diarrhea, constipation and blood in stool.  Genitourinary: Negative for dysuria, urgency, frequency and hematuria.  Musculoskeletal: Negative for falls.  Neurological: Negative for dizziness, tremors, focal weakness and headaches.  Endo/Heme/Allergies: Does not bruise/bleed easily.  Psychiatric/Behavioral: Negative for depression. The patient does not have insomnia.     VITAL SIGNS: Blood pressure 114/53, pulse 70, temperature 98 F (36.7 C), temperature source Oral, resp. rate 18, height 5\' 5"   (1.651 m), weight 86.7 kg (191 lb 2.2 oz), SpO2 98 %.  PHYSICAL EXAMINATION:   GENERAL:  54 y.o.-year-old patient lying in the bed  In mild-to-moderate distress due to epigastric and upper abdominal discomfort, pain. Dry oral mucosa EYES: Pupils equal, round, reactive to light and accommodation. No scleral icterus. Extraocular muscles intact.  HEENT: Head atraumatic, normocephalic. Oropharynx and nasopharynx clear.  NECK:  Supple, no jugular venous distention. No thyroid enlargement, no tenderness.  LUNGS: Normal breath sounds bilaterally. No use of accessory muscles of respiration.  CARDIOVASCULAR: S1, S2 normal. No murmurs, rubs, or gallops.  ABDOMEN: Soft, tender in all upper abdomen with some voluntary guarding, but no rebound, nondistended. Bowel sounds present. No organomegaly or mass.  EXTREMITIES: No pedal edema, cyanosis, or clubbing.  NEUROLOGIC: Cranial nerves II through XII are intact. Muscle strength 5/5 in all extremities. Sensation intact. Gait not checked.  PSYCHIATRIC: The patient is alert and oriented x 3.  SKIN: No obvious rash, lesion, or ulcer.   ORDERS/RESULTS REVIEWED:   CBC  Recent Labs Lab 03/30/15 0846 04/01/15 1218 04/04/15 0952  WBC 6.6 4.8 5.9  HGB 8.0* 8.0* 7.3*  HCT 24.4* 24.6* 22.4*  PLT 169 190 203  MCV 84.9 86.0 86.6  MCH 27.8 27.8 28.3  MCHC 32.7 32.3 32.7  RDW 19.9* 19.9* 20.9*  LYMPHSABS 2.2 1.5  --   MONOABS 0.6 0.3  --   EOSABS 0.3 0.2  --   BASOSABS 0.0 0.0  --    ------------------------------------------------------------------------------------------------------------------  Chemistries   Recent Labs Lab 03/30/15 0846 04/01/15 1218 04/02/15 0444 04/02/15 1031 04/03/15 0539 04/04/15 0925  NA 136 138 140  --   --  133*  K 2.8* 3.1* 2.9*  --  3.9 4.9  CL 98* 96* 100*  --   --  95*  CO2 31 30 32  --   --  30  GLUCOSE 89 99 <20*  --   --  96  BUN 9 14 16   --   --  10  CREATININE 2.94* 5.53* 6.11*  --   --  4.94*  CALCIUM  8.1* 8.7* 8.7*  --   --  8.5*  MG  --   --   --  2.1  --   --   AST 20 22 19   --   --   --   ALT 12* 12* 13*  --   --   --   ALKPHOS 104 111 119  --   --   --   BILITOT 0.6 0.8 0.5  --   --   --    ------------------------------------------------------------------------------------------------------------------ estimated creatinine clearance is 14 mL/min (by C-G formula based on Cr of 4.94). ------------------------------------------------------------------------------------------------------------------ No results for input(s): TSH, T4TOTAL, T3FREE, THYROIDAB in the last 72 hours.  Invalid input(s): FREET3  Cardiac Enzymes  Recent Labs Lab 04/01/15 1218  TROPONINI <0.03   ------------------------------------------------------------------------------------------------------------------ Invalid input(s): POCBNP ---------------------------------------------------------------------------------------------------------------  RADIOLOGY: No results found.  EKG:  Orders placed or performed during the hospital encounter of 04/01/15  . ED EKG   (only if pt is 56 y.o. or older and pain is above umbilicus)  . ED EKG   (only if pt is 56 y.o. or older and pain is above umbilicus)  . EKG 12-Lead  . EKG 12-Lead    ASSESSMENT AND PLAN:  Principal Problem:   Acute respiratory failure with hypoxia Active Problems:   Acute pulmonary edema   Nausea and vomiting   Hypoglycemia associated with diabetes   Acute diastolic CHF (congestive heart failure)   Diabetic gastroparesis   Pressure ulcer   Anemia of chronic disease   Secondary hyperparathyroidism   Hypokalemia   Generalized weakness *. Acute respiratory failure with hypoxia due to acute pulmonary edema, and acute diastolic CHF status post hemodialysis with 2 L of fluid removal on 04/02/2015, and 13th of July 2016. Patient is off oxygen at present. Continue ambulating following oxygen levels on exertion.  *. Nausea and vomiting,  suspected gastroparesis exacerbation, continue patient on PPIs , Carafate , Reglan, advance diet to full liquid and soft. If possible, patient would benefit from frequent small meals *Hypoglycemia, discontinue Lantus, resume it when patient is taking good PO, due to intermittent nausea and vomiting * Hypokalemia, supplemented orally, resolved , magnesium level was normal at 2.1 *. Anemia, likely anemia of chronic disease, of CK D. Patient is receiving Epogen with hemodialysis. Following as outpatient *Generalized weakness. Physical therapist recommended skilled nursing facility rehabilitation placement, where patient will be discharged today, as long as she is able to do to tolerate full liquid diet * Diarrhea, possibly bacterial overgrowth. Resolved now ,. Continue patient on acidophilus, unable to get stool cultures including for C. difficile so far since diarrhea has subsided     Management plans discussed with the patient, family and they are in agreement.   DRUG ALLERGIES:  Allergies  Allergen Reactions  . Ace Inhibitors Swelling  . Codeine Nausea And Vomiting  . Compazine [Prochlorperazine Edisylate] Nausea And Vomiting  . Losartan Other (See Comments)  . Phenergan [Promethazine Hcl] Other (See Comments)    Hallucinations  . Zofran [Ondansetron Hcl] Other (See Comments)    Dementia sx  . Oxycodone Anxiety  . Tape Rash  CODE STATUS:     Code Status Orders        Start     Ordered   04/01/15 1555  Full code   Continuous     04/01/15 1558    Advance Directive Documentation        Most Recent Value   Type of Advance Directive  Living will   Pre-existing out of facility DNR order (yellow form or pink MOST form)     "MOST" Form in Place?        TOTAL TIME TAKING CARE OF THIS PATIENT: 21minutes.    Theodoro Grist M.D on 04/05/2015 at 2:11 PM  Between 7am to 6pm - Pager - [phone]  After 6pm go to [email or web address] - password [email or web address] [facility]  Office  [phone]  CC: Primary care physician; Ellamae Sia, MD

## 2015-04-10 ENCOUNTER — Emergency Department: Payer: Medicare Other

## 2015-04-10 ENCOUNTER — Other Ambulatory Visit: Payer: Self-pay

## 2015-04-10 ENCOUNTER — Encounter: Payer: Self-pay | Admitting: Emergency Medicine

## 2015-04-10 ENCOUNTER — Encounter: Payer: Medicare Other | Admitting: Physical Therapy

## 2015-04-10 ENCOUNTER — Emergency Department
Admission: EM | Admit: 2015-04-10 | Discharge: 2015-04-10 | Disposition: A | Discharge status: Home / Self Care | Payer: Medicare Other | Attending: Emergency Medicine | Admitting: Emergency Medicine

## 2015-04-10 DIAGNOSIS — I10 Essential (primary) hypertension: Secondary | ICD-10-CM | POA: Insufficient documentation

## 2015-04-10 DIAGNOSIS — R112 Nausea with vomiting, unspecified: Secondary | ICD-10-CM | POA: Diagnosis not present

## 2015-04-10 DIAGNOSIS — K5900 Constipation, unspecified: Secondary | ICD-10-CM | POA: Insufficient documentation

## 2015-04-10 DIAGNOSIS — Z720 Tobacco use: Secondary | ICD-10-CM | POA: Diagnosis not present

## 2015-04-10 DIAGNOSIS — R1013 Epigastric pain: Secondary | ICD-10-CM | POA: Diagnosis present

## 2015-04-10 DIAGNOSIS — R109 Unspecified abdominal pain: Secondary | ICD-10-CM

## 2015-04-10 DIAGNOSIS — E119 Type 2 diabetes mellitus without complications: Secondary | ICD-10-CM | POA: Diagnosis not present

## 2015-04-10 LAB — COMPREHENSIVE METABOLIC PANEL
ALT: 12 U/L — ABNORMAL LOW (ref 14–54)
AST: 19 U/L (ref 15–41)
Albumin: 2.7 g/dL — ABNORMAL LOW (ref 3.5–5.0)
Alkaline Phosphatase: 99 U/L (ref 38–126)
Anion gap: 10 (ref 5–15)
BUN: 5 mg/dL — ABNORMAL LOW (ref 6–20)
CO2: 31 mmol/L (ref 22–32)
Calcium: 8.5 mg/dL — ABNORMAL LOW (ref 8.9–10.3)
Chloride: 98 mmol/L — ABNORMAL LOW (ref 101–111)
Creatinine, Ser: 3 mg/dL — ABNORMAL HIGH (ref 0.44–1.00)
GFR calc Af Amer: 19 mL/min — ABNORMAL LOW (ref >60)
GFR calc non Af Amer: 16 mL/min — ABNORMAL LOW (ref >60)
Glucose, Bld: 65 mg/dL (ref 65–99)
Potassium: 3.3 mmol/L — ABNORMAL LOW (ref 3.5–5.1)
Sodium: 139 mmol/L (ref 135–145)
Total Bilirubin: 0.6 mg/dL (ref 0.3–1.2)
Total Protein: 6.3 g/dL — ABNORMAL LOW (ref 6.5–8.1)

## 2015-04-10 LAB — CBC WITH DIFFERENTIAL/PLATELET
Basophils Absolute: 0.1 10*3/uL (ref 0–0.1)
Basophils Relative: 2 %
Eosinophils Absolute: 0.2 10*3/uL (ref 0–0.7)
Eosinophils Relative: 5 %
HCT: 25.1 % — ABNORMAL LOW (ref 35.0–47.0)
Hemoglobin: 8.1 g/dL — ABNORMAL LOW (ref 12.0–16.0)
Lymphocytes Relative: 58 %
Lymphs Abs: 2.7 10*3/uL (ref 1.0–3.6)
MCH: 28 pg (ref 26.0–34.0)
MCHC: 32.3 g/dL (ref 32.0–36.0)
MCV: 86.9 fL (ref 80.0–100.0)
Monocytes Absolute: 0.4 10*3/uL (ref 0.2–0.9)
Monocytes Relative: 9 %
Neutro Abs: 1.2 10*3/uL — ABNORMAL LOW (ref 1.4–6.5)
Neutrophils Relative %: 26 %
Platelets: 223 10*3/uL (ref 150–440)
RBC: 2.89 MIL/uL — ABNORMAL LOW (ref 3.80–5.20)
RDW: 20.4 % — ABNORMAL HIGH (ref 11.5–14.5)
WBC: 4.6 10*3/uL (ref 3.6–11.0)

## 2015-04-10 LAB — TROPONIN I: Troponin I: <0.03 ng/mL (ref <0.031)

## 2015-04-10 LAB — LIPASE, BLOOD: Lipase: <10 U/L — ABNORMAL LOW (ref 22–51)

## 2015-04-10 MED ORDER — MORPHINE SULFATE 4 MG/ML IJ SOLN
4.0000 mg | Freq: Once | INTRAMUSCULAR | Status: AC
  Administered 2015-04-10: 4 mg via INTRAVENOUS
  Filled 2015-04-10: qty 1

## 2015-04-10 MED ORDER — DIAZEPAM 5 MG PO TABS: 5.0000 mg | ORAL_TABLET | Freq: Three times a day (TID) | ORAL | Status: DC | PRN

## 2015-04-10 MED ORDER — METOCLOPRAMIDE HCL 5 MG/ML IJ SOLN
10.0000 mg | Freq: Once | INTRAMUSCULAR | Status: AC
  Administered 2015-04-10: 10 mg via INTRAVENOUS
  Filled 2015-04-10: qty 2

## 2015-04-10 MED ORDER — LORAZEPAM 2 MG/ML IJ SOLN
0.5000 mg | Freq: Once | INTRAMUSCULAR | Status: DC
  Filled 2015-04-10: qty 1

## 2015-04-10 MED ORDER — SODIUM CHLORIDE 0.9 % IV SOLN
1000.0000 mL | Freq: Once | INTRAVENOUS | Status: AC
  Administered 2015-04-10: 1000 mL via INTRAVENOUS

## 2015-04-10 MED ORDER — POLYETHYLENE GLYCOL 3350 17 G PO PACK: 17.0000 g | PACK | Freq: Every day | ORAL | Status: DC

## 2015-04-10 NOTE — Discharge Instructions (Signed)
Abdominal Pain Many things can cause abdominal pain. Usually, abdominal pain is not caused by a disease and will improve without treatment. It can often be observed and treated at home. Your health care provider will do a physical exam and possibly order blood tests and X-rays to help determine the seriousness of your pain. However, in many cases, more time must pass before a clear cause of the pain can be found. Before that point, your health care provider may not know if you need more testing or further treatment. HOME CARE INSTRUCTIONS  Monitor your abdominal pain for any changes. The following actions may help to alleviate any discomfort you are experiencing:  Only take over-the-counter or prescription medicines as directed by your health care provider.  Do not take laxatives unless directed to do so by your health care provider.  Try a clear liquid diet (broth, tea, or water) as directed by your health care provider. Slowly move to a bland diet as tolerated. SEEK MEDICAL CARE IF: 1. You have unexplained abdominal pain. 2. You have abdominal pain associated with nausea or diarrhea. 3. You have pain when you urinate or have a bowel movement. 4. You experience abdominal pain that wakes you in the night. 5. You have abdominal pain that is worsened or improved by eating food. 6. You have abdominal pain that is worsened with eating fatty foods. 7. You have a fever. SEEK IMMEDIATE MEDICAL CARE IF:   Your pain does not go away within 2 hours.  You keep throwing up (vomiting).  Your pain is felt only in portions of the abdomen, such as the right side or the left lower portion of the abdomen.  You pass bloody or black tarry stools. MAKE SURE YOU:  Understand these instructions.   Will watch your condition.   Will get help right away if you are not doing well or get worse.  Document Released: 06/18/2005 Document Revised: 09/13/2013 Document Reviewed: 05/18/2013 [facility] Patient  Information 2015 [place], [place]. This information is not intended to replace advice given to you by your health care provider. Make sure you discuss any questions you have with your health care provider.  Cyclic Vomiting Syndrome Cyclic vomiting syndrome is a benign condition in which patients experience bouts or cycles of severe nausea and vomiting that last for hours or even days. The bouts of nausea and vomiting alternate with longer periods of no symptoms and generally good health. Cyclic vomiting syndrome occurs mostly in children, but can affect adults. CAUSES  [facility] has no known cause. Each episode is typically similar to the previous ones. The episodes tend to:   Start at about the same time of day.  Last the same length of time.  Present the same symptoms at the same level of intensity. Cyclic vomiting syndrome can begin at any age in children and adults. Cyclic vomiting syndrome usually starts between the ages of 3 and 7 years. In adults, episodes tend to occur less often than they do in children, but they last longer. Furthermore, the events or situations that trigger episodes in adults cannot always be pinpointed as easily as they can in children. There are 4 phases of cyclic vomiting syndrome: 8. Prodrome. The prodrome phase signals that an episode of nausea and vomiting is about to begin. This phase can last from just a few minutes to several hours. This phase is often marked by belly (abdominal) pain. Sometimes taking medicine early in the prodrome phase can stop an episode in progress. However,  sometimes there is no warning. A person may simply wake up in the middle of the night or early morning and begin vomiting. 9. Episode. The episode phase consists of:  Severe vomiting.  Nausea.  Gagging (retching). 10. Recovery. The recovery phase begins when the nausea and vomiting stop. Healthy color, appetite, and energy return. 11. Symptom-free interval. The symptom-free interval phase  is the period between episodes when no symptoms are present. TRIGGERS Episodes can be triggered by an infection or event. Examples of triggers include:  Infections.  Colds, allergies, sinus problems, and the flu.  Eating certain foods such as chocolate or cheese.  Foods with monosodium glutamate (MSG) or preservatives.  Fast foods.  Pre-packaged foods.  Foods with low nutritional value (junk foods).  Overeating.  Eating just before going to bed.  Hot weather.  Dehydration.  Not enough sleep or poor sleep quality.  Physical exhaustion.  Menstruation.  Motion sickness.  Emotional stress (school or home difficulties).  Excitement or stress. SYMPTOMS  The main symptoms of cyclic vomiting syndrome are:  Severe vomiting.  Nausea.  Gagging (retching). Episodes usually begin at night or the first thing in the morning. Episodes may include vomiting or retching up to 5 or 6 times an hour during the worst of the episode. Episodes usually last anywhere from 1 to 4 days. Episodes can last for up to 10 days. Other symptoms include:  Paleness.  Exhaustion.  Listlessness.  Abdominal pain.  Loose stools or diarrhea. Sometimes the nausea and vomiting are so severe that a person appears to be almost unconscious. Sensitivity to light, headache, fever, dizziness, may also accompany an episode. In addition, the vomiting may cause drooling and excessive thirst. Drinking water usually leads to more vomiting, though the water can dilute the acid in the vomit, making the episode a little less painful. Continuous vomiting can lead to dehydration, which means that the body has lost excessive water and salts. DIAGNOSIS  Cyclic vomiting syndrome is hard to diagnose because there are no clear tests to identify it. A caregiver must diagnose cyclic vomiting syndrome by looking at symptoms and medical history. A caregiver must exclude more common diseases or disorders that can also cause  nausea and vomiting. Also, diagnosis takes time because caregivers need to identify a pattern or cycle to the vomiting. TREATMENT  Cyclic vomiting syndrome cannot be cured. Treatment varies, but people with cyclic vomiting syndrome should get plenty of rest and sleep and take medications that prevent, stop, or lessen the vomiting episodes and other symptoms. People whose episodes are frequent and long-lasting may be treated during the symptom-free intervals in an effort to prevent or ease future episodes. The symptom-free phase is a good time to eliminate anything known to trigger an episode. For example, if episodes are brought on by stress or excitement, this period is the time to find ways to reduce stress and stay calm. If sinus problems or allergies cause episodes, those conditions should be treated. The triggers listed above should be avoided or prevented. Because of the similarities between migraine and cyclic vomiting syndrome, caregivers treat some people with severe cyclic vomiting syndrome with drugs that are also used for migraine headaches. The drugs are designed to:  Prevent episodes.  Reduce their frequency.  Lessen their severity. HOME CARE INSTRUCTIONS Once a vomiting episode begins, treatment is supportive. It helps to stay in bed and sleep in a dark, quiet room. Severe nausea and vomiting may require hospitalization and intravenous (IV) fluids to prevent dehydration.  Relaxing medications (sedatives) may help if the nausea continues. Sometimes, during the prodrome phase, it is possible to stop an episode from happening altogether. Only take over-the-counter or prescription medicines for pain, discomfort or fever as directed by your caregiver. Do not give aspirin to children. During the recovery phase, drinking water and replacing lost electrolytes (salts in the blood) are very important. Electrolytes are salts that the body needs to function well and stay healthy. Symptoms during the  recovery phase can vary. Some people find that their appetites return to normal immediately, while others need to begin by drinking clear liquids and then move slowly to solid food. RELATED COMPLICATIONS The severe vomiting that defines cyclic vomiting syndrome is a risk factor for several complications:  Dehydration--Vomiting causes the body to lose water quickly.  Electrolyte imbalance--Vomiting also causes the body to lose the important salts it needs to keep working properly.  Peptic esophagitis--The tube that connects the mouth to the stomach (esophagus) becomes injured from the stomach acid that comes up with the vomit.  Hematemesis--The esophagus becomes irritated and bleeds, so blood mixes with the vomit.  Mallory-Weiss tear--The lower end of the esophagus may tear open or the stomach may bruise from vomiting or retching.  Tooth decay--The acid in the vomit can hurt the teeth by corroding the tooth enamel. SEEK MEDICAL CARE IF: You have questions or problems. Document Released: 11/17/2001 Document Revised: 12/01/2011 Document Reviewed: 12/16/2010 [facility] Patient Information 2015 [place], [place]. This information is not intended to replace advice given to you by your health care provider. Make sure you discuss any questions you have with your health care provider.

## 2015-04-10 NOTE — ED Provider Notes (Signed)
[facility] Emergency Department Provider Note     Time seen: ----------------------------------------- 8:11 AM on 04/10/2015 -----------------------------------------    I have reviewed the triage vital signs and the nursing notes.   HISTORY  Chief Complaint No chief complaint on file.    HPI [name] is a 54 y.o. female brought to the ER from [facility] with abdominal pain and vomiting. Patient does report history of same, presents with severe epigastric pain and intractable vomiting. Reportedly she is allergic to Zofran and Phenergan. She is a dialysis patient, just completed normal dialysis yesterday. Patient has had this happen before, nothing has made it better.   Past Medical History  Diagnosis Date  . Asthma   . Collagen vascular disease   . Diabetes mellitus without complication   . Hypertension   . Coronary artery disease     a. cath 2013: stenting to RCA (report not available); b. cath 2014: LM nl, pLAD 40%, mLAD nl, ost LCx 40%, mid LCx nl, pRCA 30% @ site of prior stent, mRCA 50%  . Diabetic neuropathy   . ESRD (end stage renal disease) on dialysis     M-W-F  . GERD (gastroesophageal reflux disease)   . Hx of pancreatitis 2015  . Myocardial infarction   . Pneumonia   . Mitral regurgitation     a. echo 10/2013: EF 62%, noWMA, mildly dilated LA, mild to mod MR/TR, GR1DD  . COPD (chronic obstructive pulmonary disease)   . Acute diastolic CHF (congestive heart failure) 04/05/2015    Patient Active Problem List   Diagnosis Date Noted  . Acute diastolic CHF (congestive heart failure) 04/05/2015  . Diabetic gastroparesis 04/05/2015  . Hypokalemia 04/05/2015  . Generalized weakness 04/05/2015  . Acute pulmonary edema 04/03/2015  . Nausea and vomiting 04/03/2015  . Hypoglycemia associated with diabetes 04/03/2015  . Anemia of chronic disease 04/03/2015  . Secondary hyperparathyroidism 04/03/2015  . Pressure ulcer 04/02/2015  .  Acute respiratory failure with hypoxia 04/01/2015  . Adjustment disorder with anxiety 03/14/2015  . Somatic symptom disorder, mild 03/08/2015  . Coronary artery disease involving native coronary artery of native heart without angina pectoris   . Nausea & vomiting 03/06/2015  . Abdominal pain 03/06/2015  . DM (diabetes mellitus) 03/06/2015  . HTN (hypertension) 03/06/2015  . Gastroparesis 02/24/2015  . Pleural effusion 02/19/2015  . HCAP (healthcare-associated pneumonia) 02/19/2015  . End-stage renal disease on hemodialysis 02/19/2015    Past Surgical History  Procedure Laterality Date  . Cholecystectomy    . Appendectomy    . Abdominal hysterectomy    . Dialysis fistula creation Left     upper arm  . Esophagogastroduodenoscopy N/A 03/08/2015    Procedure: ESOPHAGOGASTRODUODENOSCOPY (EGD);  Surgeon: Manya Silvas, MD;  [facility] END[facility]COPY;  Service: Endoscopy;  Laterality: N/A;    Allergies Ace inhibitors; Codeine; Compazine; Losartan; Phenergan; Zofran; Oxycodone; and Tape  Social History History  Substance Use Topics  . Smoking status: Current Some Day Smoker -- 0.50 packs/day    Types: Cigarettes    Last Attempt to Quit: 02/13/2015  . Smokeless tobacco: Never Used  . Alcohol Use: No    Review of Systems Constitutional: Negative for fever. Eyes: Negative for visual changes. ENT: Negative for sore throat. Cardiovascular: Negative for chest pain. Respiratory: Negative for shortness of breath. Gastrointestinal: Positive for abdominal pain and vomiting Genitourinary: Negative for dysuria. Musculoskeletal: Negative for back pain. Skin: Negative for rash. Neurological: Negative for headaches, focal weakness or numbness.  10-point ROS otherwise negative.  ____________________________________________   PHYSICAL EXAM:  VITAL SIGNS: ED Triage Vitals  Enc Vitals Group     BP --      Pulse --      Resp --      Temp --      Temp src --      SpO2 --       Weight --      Height --      Head Cir --      Peak Flow --      Pain Score --      Pain Loc --      Pain Edu? --      Excl. in [place]? --     Constitutional: Alert, actively retching, mild distress. Eyes: Conjunctivae are normal. PERRL. Normal extraocular movements. ENT   Head: Normocephalic and atraumatic.   Nose: No congestion/rhinnorhea.   Mouth/Throat: Mucous membranes are moist.   Neck: No stridor. Cardiovascular: Normal rate, regular rhythm. Normal and symmetric distal pulses are present in all extremities. No murmurs, rubs, or gallops. Respiratory: Normal respiratory effort without tachypnea nor retractions. Breath sounds are clear and equal bilaterally. No wheezes/rales/rhonchi. Gastrointestinal: Soft, epigastric tenderness. Normal bowel sounds.. Musculoskeletal: Nontender with normal range of motion in all extremities. No joint effusions.  No lower extremity tenderness nor edema. Neurologic:  Normal speech and language. No gross focal neurologic deficits are appreciated. Speech is normal. No gait instability. Skin:  Skin is warm, dry and intact. No rash noted. Psychiatric: Mood and affect are normal. Speech and behavior are normal. Patient exhibits appropriate insight and judgment. ____________________________________________  EKG: Interpreted by me, rate is 79 bpm, normal sinus rhythm, low voltage QRS, nonspecific T-wave changes. Normal axis. No evidence of acute infarction  ED COURSE:  Pertinent labs & imaging results that were available during my care of the patient were reviewed by me and considered in my medical decision making (see chart for details). Patient with possible gastroparesis, she'll be given gentle fluid boluses, IV Reglan. ____________________________________________    LABS (pertinent positives/negatives)  Labs Reviewed  COMPREHENSIVE METABOLIC PANEL - Abnormal; Notable for the following:    Potassium 3.3 (*)    Chloride 98 (*)    BUN 5  (*)    Creatinine, Ser 3.00 (*)    Calcium 8.5 (*)    Total Protein 6.3 (*)    Albumin 2.7 (*)    ALT 12 (*)    GFR calc non Af Amer 16 (*)    GFR calc Af Amer 19 (*)    All other components within normal limits  LIPASE, BLOOD - Abnormal; Notable for the following:    Lipase <10 (*)    All other components within normal limits  TROPONIN I  CBC WITH DIFFERENTIAL/PLATELET    RADIOLOGY Images were viewed by me  Abdomen flat and erect IMPRESSION: Fairly diffuse stool throughout colon. Overall bowel gas pattern unremarkable without obstruction or free air. Small pleural effusions with bibasilar atelectasis.  ____________________________________________  FINAL ASSESSMENT AND PLAN  Abdominal pain and vomiting, constipation  Plan: Patient with labs and imaging as dictated above. Patient appears to have something similar to cyclic vomiting syndrome. This may also have been gastroparesis. However she is feeling better here, there is an anxiety component to this. We'll discharge her with Reglan and Valium to take   Earleen Newport, MD   Earleen Newport, MD 04/10/15 1059

## 2015-04-10 NOTE — ED Notes (Signed)
Pt to ED via EMS from [facility] with c/o RUQ abd pain and n,v since she woke up this am, seen on 7/8 for the same, M,W,F dialysis pt, had dialysis yesterday

## 2015-04-10 NOTE — ED Notes (Signed)
Pt discharge and waiting for family to transport her back to [facility]

## 2015-04-12 ENCOUNTER — Encounter: Payer: Medicare Other | Admitting: Physical Therapy

## 2015-04-16 ENCOUNTER — Emergency Department: Payer: Medicare Other

## 2015-04-16 ENCOUNTER — Emergency Department
Admission: EM | Admit: 2015-04-16 | Discharge: 2015-04-16 | Disposition: A | Discharge status: Home / Self Care | Payer: Medicare Other | Attending: Emergency Medicine | Admitting: Emergency Medicine

## 2015-04-16 DIAGNOSIS — Z992 Dependence on renal dialysis: Secondary | ICD-10-CM | POA: Diagnosis not present

## 2015-04-16 DIAGNOSIS — E119 Type 2 diabetes mellitus without complications: Secondary | ICD-10-CM | POA: Diagnosis not present

## 2015-04-16 DIAGNOSIS — Z794 Long term (current) use of insulin: Secondary | ICD-10-CM | POA: Insufficient documentation

## 2015-04-16 DIAGNOSIS — Y998 Other external cause status: Secondary | ICD-10-CM | POA: Insufficient documentation

## 2015-04-16 DIAGNOSIS — Z7982 Long term (current) use of aspirin: Secondary | ICD-10-CM | POA: Diagnosis not present

## 2015-04-16 DIAGNOSIS — S0990XA Unspecified injury of head, initial encounter: Secondary | ICD-10-CM | POA: Diagnosis not present

## 2015-04-16 DIAGNOSIS — N186 End stage renal disease: Secondary | ICD-10-CM | POA: Insufficient documentation

## 2015-04-16 DIAGNOSIS — M7918 Myalgia, other site: Secondary | ICD-10-CM

## 2015-04-16 DIAGNOSIS — I120 Hypertensive chronic kidney disease with stage 5 chronic kidney disease or end stage renal disease: Secondary | ICD-10-CM | POA: Diagnosis not present

## 2015-04-16 DIAGNOSIS — Y92129 Unspecified place in nursing home as the place of occurrence of the external cause: Secondary | ICD-10-CM | POA: Diagnosis not present

## 2015-04-16 DIAGNOSIS — Z046 Encounter for general psychiatric examination, requested by authority: Secondary | ICD-10-CM | POA: Diagnosis present

## 2015-04-16 DIAGNOSIS — Z720 Tobacco use: Secondary | ICD-10-CM | POA: Insufficient documentation

## 2015-04-16 DIAGNOSIS — Z7902 Long term (current) use of antithrombotics/antiplatelets: Secondary | ICD-10-CM | POA: Insufficient documentation

## 2015-04-16 DIAGNOSIS — W01198A Fall on same level from slipping, tripping and stumbling with subsequent striking against other object, initial encounter: Secondary | ICD-10-CM | POA: Insufficient documentation

## 2015-04-16 DIAGNOSIS — Y9301 Activity, walking, marching and hiking: Secondary | ICD-10-CM | POA: Diagnosis not present

## 2015-04-16 DIAGNOSIS — Z79899 Other long term (current) drug therapy: Secondary | ICD-10-CM | POA: Diagnosis not present

## 2015-04-16 MED ORDER — ACETAMINOPHEN 325 MG PO TABS: 650.0000 mg | ORAL_TABLET | Freq: Four times a day (QID) | ORAL | Status: DC | PRN

## 2015-04-16 NOTE — ED Notes (Signed)
According to EMS, pt experienced a ground level fall this evening, pt denied loosing consciousness.

## 2015-04-16 NOTE — ED Provider Notes (Signed)
[facility] Emergency Department Provider Note  ____________________________________________  Time seen: 6:25 PM on arrival by EMS  I have reviewed the triage vital signs and the nursing notes.   HISTORY  Chief Complaint Fall    HPI [name] is a 54 y.o. female is transferred to the ED from [facility] after a fall at her nursing home. The patient is dialysis patient who gets her dialysis Monday Wednesday and Friday. She's been compliant with this and most recently had dialysis today. She had a full session without incident. While at home she was walking with her walker as she usually does, however she got it stuck on the answer better when she tried to back up she tripped over the walker and fell backward. She denies any dizziness or lightheadedness or syncope. When she fell she hit her head on the floor but did not lose consciousness. Denies any other preceding symptoms such as chest pain shortness of breath abdominal pain back pain headache or vision change. No numbness [place] or weakness. Afterward she only complains of pain in the back of the head where she hit the floor.     Past Medical History  Diagnosis Date  . Asthma   . Collagen vascular disease   . Diabetes mellitus without complication   . Hypertension   . Coronary artery disease     a. cath 2013: stenting to RCA (report not available); b. cath 2014: LM nl, pLAD 40%, mLAD nl, ost LCx 40%, mid LCx nl, pRCA 30% @ site of prior stent, mRCA 50%  . Diabetic neuropathy   . ESRD (end stage renal disease) on dialysis     M-W-F  . GERD (gastroesophageal reflux disease)   . Hx of pancreatitis 2015  . Myocardial infarction   . Pneumonia   . Mitral regurgitation     a. echo 10/2013: EF 62%, noWMA, mildly dilated LA, mild to mod MR/TR, GR1DD  . COPD (chronic obstructive pulmonary disease)   . Acute diastolic CHF (congestive heart failure) 04/05/2015    Patient Active Problem List   Diagnosis  Date Noted  . Acute diastolic CHF (congestive heart failure) 04/05/2015  . Diabetic gastroparesis 04/05/2015  . Hypokalemia 04/05/2015  . Generalized weakness 04/05/2015  . Acute pulmonary edema 04/03/2015  . Nausea and vomiting 04/03/2015  . Hypoglycemia associated with diabetes 04/03/2015  . Anemia of chronic disease 04/03/2015  . Secondary hyperparathyroidism 04/03/2015  . Pressure ulcer 04/02/2015  . Acute respiratory failure with hypoxia 04/01/2015  . Adjustment disorder with anxiety 03/14/2015  . Somatic symptom disorder, mild 03/08/2015  . Coronary artery disease involving native coronary artery of native heart without angina pectoris   . Nausea & vomiting 03/06/2015  . Abdominal pain 03/06/2015  . DM (diabetes mellitus) 03/06/2015  . HTN (hypertension) 03/06/2015  . Gastroparesis 02/24/2015  . Pleural effusion 02/19/2015  . HCAP (healthcare-associated pneumonia) 02/19/2015  . End-stage renal disease on hemodialysis 02/19/2015    Past Surgical History  Procedure Laterality Date  . Cholecystectomy    . Appendectomy    . Abdominal hysterectomy    . Dialysis fistula creation Left     upper arm  . Esophagogastroduodenoscopy N/A 03/08/2015    Procedure: ESOPHAGOGASTRODUODENOSCOPY (EGD);  Surgeon: Manya Silvas, MD;  Location: [facility] ENDOSCOPY;  Service: Endoscopy;  Laterality: N/A;    Current Outpatient Rx  Name  Route  Sig  Dispense  Refill  . acetaminophen (TYLENOL) 325 MG tablet   Oral   Take 2  tablets (650 mg total) by mouth every 6 (six) hours as needed.   60 tablet   0   . albuterol (PROVENTIL HFA;VENTOLIN HFA) 108 (90 BASE) MCG/ACT inhaler      Inhale 4-6 puffs by mouth every 4 hours as needed for wheezing, cough, and/or shortness of breath Patient taking differently: Inhale 4 puffs into the lungs every 4 (four) hours as needed for wheezing or shortness of breath.    1 Inhaler   1   . ALPRAZolam (XANAX) 0.25 MG tablet   Oral   Take 1 tablet (0.25 mg  total) by mouth 2 (two) times daily as needed for anxiety. Patient taking differently: Take 0.25 mg by mouth every 12 (twelve) hours as needed for anxiety.    60 tablet   0   . amLODipine (NORVASC) 10 MG tablet   Oral   Take 10 mg by mouth daily.         Marland Kitchen aspirin EC 81 MG tablet   Oral   Take 81 mg by mouth daily.         Marland Kitchen atorvastatin (LIPITOR) 40 MG tablet   Oral   Take 40 mg by mouth at bedtime.          . carvedilol (COREG) 25 MG tablet   Oral   Take 25 mg by mouth 2 (two) times daily.         . cetirizine (ZYRTEC) 10 MG tablet   Oral   Take 10 mg by mouth daily.         . clopidogrel (PLAVIX) 75 MG tablet   Oral   Take 75 mg by mouth daily.         . clotrimazole-betamethasone (LOTRISONE) cream   Topical   Apply 1 application topically 3 (three) times daily. Apply to rectum every shift         . diazepam (VALIUM) 5 MG tablet   Oral   Take 1 tablet (5 mg total) by mouth every 8 (eight) hours as needed for muscle spasms.   20 tablet   0   . docusate sodium (COLACE) 100 MG capsule   Oral   Take 1 capsule (100 mg total) by mouth 2 (two) times daily.   10 capsule   0   . hydrALAZINE (APRESOLINE) 100 MG tablet   Oral   Take 1 tablet (100 mg total) by mouth 3 (three) times daily.   90 tablet   1   . HYDROcodone-acetaminophen (NORCO/VICODIN) 5-325 MG per tablet   Oral   Take 1 tablet by mouth every 6 (six) hours as needed for moderate pain.   20 tablet   0   . hydrocortisone 1 % ointment   Topical   Apply 1 application topically 2 (two) times daily. Apply to sacral area         . insulin aspart (NOVOLOG) 100 UNIT/ML injection   Subcutaneous   Inject 2-10 Units into the skin 4 (four) times daily -  with meals and at bedtime. Per sliding scale         . insulin detemir (LEVEMIR) 100 UNIT/ML injection   Subcutaneous   Inject 8 Units into the skin at bedtime.         . Liniments (SALONPAS ARTHRITIS PAIN RELIEF) PADS   Apply  externally   Apply 1 each topically 3 (three) times daily. (every shift)         . lipase/protease/amylase (CREON) 12000 UNITS CPEP capsule   Oral  Take 24,000-48,000 Units by mouth 5 (five) times daily. 4 capsules 3 times daily with meals and 2 capsules 2 times daily with snacks         . metoCLOPramide (REGLAN) 5 MG tablet   Oral   Take 1 tablet (5 mg total) by mouth 4 (four) times daily -  before meals and at bedtime.   40 tablet   0   . nitroGLYCERIN (NITROSTAT) 0.4 MG SL tablet   Sublingual   Place 0.4 mg under the tongue every 5 (five) minutes x 3 doses as needed for chest pain.          . Omega-3 Fatty Acids (FISH OIL) 1000 MG CAPS   Oral   Take 1 capsule by mouth 2 (two) times daily.         . ondansetron (ZOFRAN) 4 MG tablet   Oral   Take 1 tablet (4 mg total) by mouth daily as needed for nausea or vomiting.   20 tablet   1   . pantoprazole (PROTONIX) 40 MG tablet   Oral   Take 1 tablet (40 mg total) by mouth 2 (two) times daily before a meal.   60 tablet   0   . polyethylene glycol (MIRALAX / GLYCOLAX) packet   Oral   Take 17 g by mouth daily.   14 each   0   . ranitidine (ZANTAC) 150 MG tablet   Oral   Take 150 mg by mouth 2 (two) times daily.         . ranolazine (RANEXA) 500 MG 12 hr tablet   Oral   Take 1,000 mg by mouth 2 (two) times daily.         Marland Kitchen senna (SENOKOT) 8.6 MG TABS tablet   Oral   Take 1 tablet (8.6 mg total) by mouth daily as needed for mild constipation. Patient taking differently: Take 1 tablet by mouth 2 (two) times daily.    120 each   0   . sucralfate (CARAFATE) 1 G tablet   Oral   Take 1 tablet (1 g total) by mouth 4 (four) times daily -  with meals and at bedtime.   120 tablet   0   . Vitamin D, Cholecalciferol, 400 UNITS TABS   Oral   Take 400 Units by mouth 2 (two) times daily.            Allergies Ace inhibitors; Ativan; Codeine; Compazine; Losartan; Phenergan; Zofran; Oxycodone; and  Tape  Family History  Problem Relation Age of Onset  . Kidney disease Mother   . Diabetes Mother     Social History History  Substance Use Topics  . Smoking status: Current Some Day Smoker -- 0.50 packs/day    Types: Cigarettes    Last Attempt to Quit: 02/13/2015  . Smokeless tobacco: Never Used  . Alcohol Use: No    Review of Systems  Constitutional: No fever or chills. No weight changes Eyes:No blurry vision or double vision.  ENT: No sore throat. Cardiovascular: No chest pain. Respiratory: No dyspnea or cough. Gastrointestinal: Negative for abdominal pain, vomiting and diarrhea.  No BRBPR or melena. Genitourinary: Negative for dysuria, urinary retention, bloody urine, or difficulty urinating. Musculoskeletal: Negative for back pain. No joint swelling or pain. Skin: Negative for rash. Neurological: Headache after fall. Psychiatric:No anxiety or depression.   Endocrine:No hot/cold intolerance, changes in energy, or sleep difficulty.  10-point ROS otherwise negative.  ____________________________________________   PHYSICAL EXAM:  VITAL SIGNS: ED  Triage Vitals  Enc Vitals Group     BP 04/16/15 1832 144/48 mmHg     Pulse Rate 04/16/15 1832 70     Resp --      Temp --      Temp Source 04/16/15 1832 Oral     SpO2 04/16/15 1832 97 %     Weight --      Height --      Head Cir --      Peak Flow --      Pain Score 04/16/15 1829 8     Pain Loc --      Pain Edu? --      Excl. in [place]? --      Constitutional: Alert and oriented. Well appearing and in no distress. Eyes: No scleral icterus. No conjunctival pallor. PERRL. EOMI ENT   Head: Normocephalic and atraumatic. No contusion or hematoma. There is tenderness over the occipital scalp.   Nose: No congestion/rhinnorhea. No septal hematoma   Mouth/Throat: MMM, no pharyngeal erythema. No peritonsillar mass. No uvula shift.   Neck: No stridor. No SubQ emphysema. No  meningismus. Hematological/Lymphatic/Immunilogical: No cervical lymphadenopathy. Cardiovascular: RRR. Normal and symmetric distal pulses are present in all extremities. No murmurs, rubs, or gallops. There is an old dialysis graft in the right upper extremity. There is a left upper extremity AV fistula which has a good thrill. There is a bandage over but is hemostatic. Respiratory: Normal respiratory effort without tachypnea nor retractions. Breath sounds are clear and equal bilaterally. No wheezes/rales/rhonchi. Gastrointestinal: Soft and nontender. No distention. There is no CVA tenderness.  No rebound, rigidity, or guarding. Genitourinary: deferred Musculoskeletal: Nontender with normal range of motion in all extremities. No joint effusions.  No lower extremity tenderness.  No edema. There is diffuse tenderness in the midline over the thoracic spine. No step-offs or crunchiness. Neurologic:   Normal speech and language.  CN 2-10 normal. Motor grossly intact. No pronator drift.  Normal gait. No gross focal neurologic deficits are appreciated.  Skin:  Skin is warm, dry and intact. No rash noted.  No petechiae, purpura, or bullae. Psychiatric: Mood and affect are normal. Speech and behavior are normal. Patient exhibits appropriate insight and judgment.  ____________________________________________    LABS (pertinent positives/negatives) (all labs ordered are listed, but only abnormal results are displayed) Labs Reviewed - No data to display ____________________________________________   EKG    ____________________________________________    RADIOLOGY  CT head unremarkable X-ray thoracic spine unremarkable  ____________________________________________   PROCEDURES  ____________________________________________   INITIAL IMPRESSION / ASSESSMENT AND PLAN / ED COURSE  Pertinent labs & imaging results that were available during my care of the patient were reviewed by me and  considered in my medical decision making (see chart for details).  Patient presents with mechanical fall without other symptoms. Since she is otherwise at her usual state of health and has been compliant with dialysis including today with a full session, very low suspicion for any metabolic derangement on her labs. No evidence of infection or fracture. Check CT head due to the chronic illness of dialysis as well as a x-ray of the thoracic spine. If negative patient will be stable for discharge home.  ____________________________________________   FINAL CLINICAL IMPRESSION(S) / ED DIAGNOSES  Final diagnoses:  Musculoskeletal pain      Carrie Mew, MD 04/16/15 1924

## 2015-04-16 NOTE — Discharge Instructions (Signed)
Musculoskeletal Pain °Musculoskeletal pain is muscle and boney aches and pains. These pains can occur in any part of the body. Your caregiver may treat you without knowing the cause of the pain. They may treat you if blood or urine tests, X-rays, and other tests were normal.  °CAUSES °There is often not a definite cause or reason for these pains. These pains may be caused by a type of germ (virus). The discomfort may also come from overuse. Overuse includes working out too hard when your body is not fit. Boney aches also come from weather changes. Bone is sensitive to atmospheric pressure changes. °HOME CARE INSTRUCTIONS  °· Ask when your test results will be ready. Make sure you get your test results. °· Only take over-the-counter or prescription medicines for pain, discomfort, or fever as directed by your caregiver. If you were given medications for your condition, do not drive, operate machinery or power tools, or sign legal documents for 24 hours. Do not drink alcohol. Do not take sleeping pills or other medications that may interfere with treatment. °· Continue all activities unless the activities cause more pain. When the pain lessens, slowly resume normal activities. Gradually increase the intensity and duration of the activities or exercise. °· During periods of severe pain, bed rest may be helpful. Lay or sit in any position that is comfortable. °· Putting ice on the injured area. °¨ Put ice in a bag. °¨ Place a towel between your skin and the bag. °¨ Leave the ice on for 15 to 20 minutes, 3 to 4 times a day. °· Follow up with your caregiver for continued problems and no reason can be found for the pain. If the pain becomes worse or does not go away, it may be necessary to repeat tests or do additional testing. Your caregiver may need to look further for a possible cause. °SEEK IMMEDIATE MEDICAL CARE IF: °· You have pain that is getting worse and is not relieved by medications. °· You develop chest pain  that is associated with shortness or breath, sweating, feeling sick to your stomach (nauseous), or throw up (vomit). °· Your pain becomes localized to the abdomen. °· You develop any new symptoms that seem different or that concern you. °MAKE SURE YOU:  °· Understand these instructions. °· Will watch your condition. °· Will get help right away if you are not doing well or get worse. °Document Released: 09/08/2005 Document Revised: 12/01/2011 Document Reviewed: 05/13/2013 °[employer]® Patient Information ©2015 [employer]. This information is not intended to replace advice given to you by your health care provider. Make sure you discuss any questions you have with your health care provider. ° °

## 2015-04-17 ENCOUNTER — Encounter: Payer: Medicare Other | Admitting: Physical Therapy

## 2015-04-19 ENCOUNTER — Encounter: Payer: Medicare Other | Admitting: Physical Therapy

## 2015-04-24 ENCOUNTER — Emergency Department: Payer: Medicare Other

## 2015-04-24 ENCOUNTER — Inpatient Hospital Stay
Admission: EM | Admit: 2015-04-24 | Discharge: 2015-04-26 | DRG: 637 | Disposition: A | Discharge status: Home / Self Care | Payer: Medicare Other | Attending: Internal Medicine | Admitting: Internal Medicine

## 2015-04-24 ENCOUNTER — Inpatient Hospital Stay: Payer: Medicare Other

## 2015-04-24 ENCOUNTER — Encounter: Payer: Medicare Other | Admitting: Physical Therapy

## 2015-04-24 DIAGNOSIS — D631 Anemia in chronic kidney disease: Secondary | ICD-10-CM | POA: Diagnosis present

## 2015-04-24 DIAGNOSIS — Z886 Allergy status to analgesic agent status: Secondary | ICD-10-CM | POA: Diagnosis not present

## 2015-04-24 DIAGNOSIS — R680 Hypothermia, not associated with low environmental temperature: Secondary | ICD-10-CM | POA: Diagnosis present

## 2015-04-24 DIAGNOSIS — E11319 Type 2 diabetes mellitus with unspecified diabetic retinopathy without macular edema: Secondary | ICD-10-CM | POA: Diagnosis present

## 2015-04-24 DIAGNOSIS — Z992 Dependence on renal dialysis: Secondary | ICD-10-CM | POA: Diagnosis not present

## 2015-04-24 DIAGNOSIS — I252 Old myocardial infarction: Secondary | ICD-10-CM

## 2015-04-24 DIAGNOSIS — N186 End stage renal disease: Secondary | ICD-10-CM | POA: Diagnosis present

## 2015-04-24 DIAGNOSIS — J9601 Acute respiratory failure with hypoxia: Secondary | ICD-10-CM | POA: Diagnosis present

## 2015-04-24 DIAGNOSIS — H109 Unspecified conjunctivitis: Secondary | ICD-10-CM | POA: Diagnosis present

## 2015-04-24 DIAGNOSIS — M25569 Pain in unspecified knee: Secondary | ICD-10-CM

## 2015-04-24 DIAGNOSIS — I120 Hypertensive chronic kidney disease with stage 5 chronic kidney disease or end stage renal disease: Secondary | ICD-10-CM | POA: Diagnosis present

## 2015-04-24 DIAGNOSIS — Z888 Allergy status to other drugs, medicaments and biological substances status: Secondary | ICD-10-CM

## 2015-04-24 DIAGNOSIS — T68XXXA Hypothermia, initial encounter: Secondary | ICD-10-CM

## 2015-04-24 DIAGNOSIS — Z90710 Acquired absence of both cervix and uterus: Secondary | ICD-10-CM | POA: Diagnosis not present

## 2015-04-24 DIAGNOSIS — R4189 Other symptoms and signs involving cognitive functions and awareness: Secondary | ICD-10-CM | POA: Diagnosis present

## 2015-04-24 DIAGNOSIS — I2510 Atherosclerotic heart disease of native coronary artery without angina pectoris: Secondary | ICD-10-CM | POA: Diagnosis present

## 2015-04-24 DIAGNOSIS — Z9049 Acquired absence of other specified parts of digestive tract: Secondary | ICD-10-CM | POA: Diagnosis present

## 2015-04-24 DIAGNOSIS — Z79899 Other long term (current) drug therapy: Secondary | ICD-10-CM | POA: Diagnosis not present

## 2015-04-24 DIAGNOSIS — J45909 Unspecified asthma, uncomplicated: Secondary | ICD-10-CM | POA: Diagnosis present

## 2015-04-24 DIAGNOSIS — E871 Hypo-osmolality and hyponatremia: Secondary | ICD-10-CM | POA: Diagnosis present

## 2015-04-24 DIAGNOSIS — E162 Hypoglycemia, unspecified: Secondary | ICD-10-CM | POA: Diagnosis present

## 2015-04-24 DIAGNOSIS — J9600 Acute respiratory failure, unspecified whether with hypoxia or hypercapnia: Secondary | ICD-10-CM | POA: Diagnosis not present

## 2015-04-24 DIAGNOSIS — J9691 Respiratory failure, unspecified with hypoxia: Secondary | ICD-10-CM

## 2015-04-24 DIAGNOSIS — E1122 Type 2 diabetes mellitus with diabetic chronic kidney disease: Secondary | ICD-10-CM | POA: Diagnosis present

## 2015-04-24 DIAGNOSIS — Z885 Allergy status to narcotic agent status: Secondary | ICD-10-CM

## 2015-04-24 DIAGNOSIS — E785 Hyperlipidemia, unspecified: Secondary | ICD-10-CM | POA: Diagnosis present

## 2015-04-24 DIAGNOSIS — Z794 Long term (current) use of insulin: Secondary | ICD-10-CM

## 2015-04-24 DIAGNOSIS — Z841 Family history of disorders of kidney and ureter: Secondary | ICD-10-CM

## 2015-04-24 DIAGNOSIS — K219 Gastro-esophageal reflux disease without esophagitis: Secondary | ICD-10-CM | POA: Diagnosis present

## 2015-04-24 DIAGNOSIS — I5032 Chronic diastolic (congestive) heart failure: Secondary | ICD-10-CM | POA: Diagnosis present

## 2015-04-24 DIAGNOSIS — I959 Hypotension, unspecified: Secondary | ICD-10-CM | POA: Diagnosis present

## 2015-04-24 DIAGNOSIS — J449 Chronic obstructive pulmonary disease, unspecified: Secondary | ICD-10-CM | POA: Diagnosis present

## 2015-04-24 DIAGNOSIS — K589 Irritable bowel syndrome without diarrhea: Secondary | ICD-10-CM | POA: Diagnosis present

## 2015-04-24 DIAGNOSIS — R001 Bradycardia, unspecified: Secondary | ICD-10-CM | POA: Diagnosis present

## 2015-04-24 DIAGNOSIS — Z833 Family history of diabetes mellitus: Secondary | ICD-10-CM | POA: Diagnosis not present

## 2015-04-24 DIAGNOSIS — F17210 Nicotine dependence, cigarettes, uncomplicated: Secondary | ICD-10-CM | POA: Diagnosis present

## 2015-04-24 DIAGNOSIS — Z7982 Long term (current) use of aspirin: Secondary | ICD-10-CM

## 2015-04-24 DIAGNOSIS — E1140 Type 2 diabetes mellitus with diabetic neuropathy, unspecified: Secondary | ICD-10-CM | POA: Diagnosis present

## 2015-04-24 DIAGNOSIS — R55 Syncope and collapse: Secondary | ICD-10-CM

## 2015-04-24 DIAGNOSIS — N2581 Secondary hyperparathyroidism of renal origin: Secondary | ICD-10-CM | POA: Diagnosis present

## 2015-04-24 DIAGNOSIS — K3184 Gastroparesis: Secondary | ICD-10-CM | POA: Diagnosis present

## 2015-04-24 DIAGNOSIS — Z7951 Long term (current) use of inhaled steroids: Secondary | ICD-10-CM

## 2015-04-24 DIAGNOSIS — E11649 Type 2 diabetes mellitus with hypoglycemia without coma: Secondary | ICD-10-CM | POA: Diagnosis present

## 2015-04-24 DIAGNOSIS — G9341 Metabolic encephalopathy: Secondary | ICD-10-CM | POA: Diagnosis present

## 2015-04-24 LAB — BLOOD GAS, ARTERIAL
Acid-Base Excess: 17.2 mmol/L — ABNORMAL HIGH (ref 0.0–3.0)
Acid-Base Excess: 12.1 mmol/L — ABNORMAL HIGH (ref 0.0–3.0)
Allens test (pass/fail): POSITIVE — AB
Allens test (pass/fail): POSITIVE — AB
Bicarbonate: 40.1 mEq/L — ABNORMAL HIGH (ref 21.0–28.0)
Bicarbonate: 37.8 mEq/L — ABNORMAL HIGH (ref 21.0–28.0)
FIO2: 0.5
FIO2: 0.3
MECHVT: 500 mL
O2 Saturation: 99.9 %
O2 Saturation: 98.9 %
PEEP: 5 cmH2O
Patient temperature: 37
Patient temperature: 37
Pressure support: 8 cmH2O
RATE: 16 resp/min
pCO2 arterial: 39 mmHg (ref 32.0–48.0)
pCO2 arterial: 52 mmHg — ABNORMAL HIGH (ref 32.0–48.0)
pH, Arterial: 7.62 (ref 7.350–7.450)
pH, Arterial: 7.47 — ABNORMAL HIGH (ref 7.350–7.450)
pO2, Arterial: 267 mmHg — ABNORMAL HIGH (ref 83.0–108.0)
pO2, Arterial: 121 mmHg — ABNORMAL HIGH (ref 83.0–108.0)

## 2015-04-24 LAB — COMPREHENSIVE METABOLIC PANEL
ALT: 9 U/L — ABNORMAL LOW (ref 14–54)
AST: 19 U/L (ref 15–41)
Albumin: 2.3 g/dL — ABNORMAL LOW (ref 3.5–5.0)
Alkaline Phosphatase: 101 U/L (ref 38–126)
Anion gap: 5 (ref 5–15)
BUN: 9 mg/dL (ref 6–20)
CO2: 36 mmol/L — ABNORMAL HIGH (ref 22–32)
Calcium: 8.3 mg/dL — ABNORMAL LOW (ref 8.9–10.3)
Chloride: 96 mmol/L — ABNORMAL LOW (ref 101–111)
Creatinine, Ser: 2.71 mg/dL — ABNORMAL HIGH (ref 0.44–1.00)
GFR calc Af Amer: 22 mL/min — ABNORMAL LOW (ref >60)
GFR calc non Af Amer: 19 mL/min — ABNORMAL LOW (ref >60)
Glucose, Bld: 76 mg/dL (ref 65–99)
Potassium: 3.4 mmol/L — ABNORMAL LOW (ref 3.5–5.1)
Sodium: 137 mmol/L (ref 135–145)
Total Bilirubin: 0.5 mg/dL (ref 0.3–1.2)
Total Protein: 5.7 g/dL — ABNORMAL LOW (ref 6.5–8.1)

## 2015-04-24 LAB — CBC
HCT: 24.7 % — ABNORMAL LOW (ref 35.0–47.0)
Hemoglobin: 7.9 g/dL — ABNORMAL LOW (ref 12.0–16.0)
MCH: 27.9 pg (ref 26.0–34.0)
MCHC: 32.2 g/dL (ref 32.0–36.0)
MCV: 86.7 fL (ref 80.0–100.0)
Platelets: 114 10*3/uL — ABNORMAL LOW (ref 150–440)
RBC: 2.85 MIL/uL — ABNORMAL LOW (ref 3.80–5.20)
RDW: 16.5 % — ABNORMAL HIGH (ref 11.5–14.5)
WBC: 3.8 10*3/uL (ref 3.6–11.0)

## 2015-04-24 LAB — LACTIC ACID, PLASMA
Lactic Acid, Venous: 2.6 mmol/L (ref 0.5–2.0)
Lactic Acid, Venous: 3 mmol/L (ref 0.5–2.0)

## 2015-04-24 LAB — TROPONIN I
Troponin I: <0.03 ng/mL (ref <0.031)
Troponin I: <0.03 ng/mL (ref <0.031)

## 2015-04-24 LAB — URINALYSIS COMPLETE WITH MICROSCOPIC (ARMC ONLY)
Bacteria, UA: NONE SEEN
Bilirubin Urine: NEGATIVE
Glucose, UA: NEGATIVE mg/dL
Hgb urine dipstick: NEGATIVE
Ketones, ur: NEGATIVE mg/dL
Leukocytes, UA: NEGATIVE
Nitrite: NEGATIVE
Protein, ur: 100 mg/dL — AB
Specific Gravity, Urine: 1.009 (ref 1.005–1.030)
Squamous Epithelial / LPF: NONE SEEN
pH: 9 — ABNORMAL HIGH (ref 5.0–8.0)

## 2015-04-24 LAB — GLUCOSE, CAPILLARY
Glucose-Capillary: 120 mg/dL — ABNORMAL HIGH (ref 65–99)
Glucose-Capillary: 48 mg/dL — ABNORMAL LOW (ref 65–99)
Glucose-Capillary: 101 mg/dL — ABNORMAL HIGH (ref 65–99)
Glucose-Capillary: 87 mg/dL (ref 65–99)
Glucose-Capillary: 76 mg/dL (ref 65–99)
Glucose-Capillary: 77 mg/dL (ref 65–99)
Glucose-Capillary: 86 mg/dL (ref 65–99)
Glucose-Capillary: 111 mg/dL — ABNORMAL HIGH (ref 65–99)

## 2015-04-24 LAB — MRSA PCR SCREENING: MRSA by PCR: NEGATIVE

## 2015-04-24 MED ORDER — ALPRAZOLAM 0.25 MG PO TABS
0.2500 mg | ORAL_TABLET | ORAL | Status: AC
  Administered 2015-04-24: 0.25 mg via ORAL
  Filled 2015-04-24: qty 1

## 2015-04-24 MED ORDER — MIDAZOLAM HCL 5 MG/5ML IJ SOLN
INTRAMUSCULAR | Status: AC
  Administered 2015-04-24: 2 mg
  Filled 2015-04-24: qty 5

## 2015-04-24 MED ORDER — ACETAMINOPHEN 650 MG RE SUPP: 650.0000 mg | Freq: Four times a day (QID) | RECTAL | Status: DC | PRN

## 2015-04-24 MED ORDER — ACETAMINOPHEN 325 MG PO TABS: 650.0000 mg | ORAL_TABLET | Freq: Four times a day (QID) | ORAL | Status: DC | PRN

## 2015-04-24 MED ORDER — ETOMIDATE 2 MG/ML IV SOLN: 0.3000 mg/kg | Freq: Once | INTRAVENOUS | Status: AC

## 2015-04-24 MED ORDER — FENTANYL 2500MCG IN NS 250ML (10MCG/ML) PREMIX INFUSION
10.0000 ug/h | INTRAVENOUS | Status: DC
  Filled 2015-04-24: qty 250

## 2015-04-24 MED ORDER — DEXTROSE 50 % IV SOLN
INTRAVENOUS | Status: AC | PRN
  Administered 2015-04-24: 1 via INTRAVENOUS

## 2015-04-24 MED ORDER — SODIUM CHLORIDE 0.9 % IV SOLN: 1.0000 g | Freq: Once | INTRAVENOUS | Status: AC

## 2015-04-24 MED ORDER — ACETAMINOPHEN 325 MG PO TABS
650.0000 mg | ORAL_TABLET | Freq: Four times a day (QID) | ORAL | Status: DC | PRN
  Administered 2015-04-24 – 2015-04-25 (×3): 650 mg via ORAL
  Filled 2015-04-24 (×3): qty 2

## 2015-04-24 MED ORDER — DEXTROSE 50 % IV SOLN
INTRAVENOUS | Status: AC
  Filled 2015-04-24: qty 50

## 2015-04-24 MED ORDER — ETOMIDATE 2 MG/ML IV SOLN
INTRAVENOUS | Status: AC | PRN
  Administered 2015-04-24: 20 mg via INTRAVENOUS

## 2015-04-24 MED ORDER — SODIUM CHLORIDE 0.9 % IJ SOLN: 3.0000 mL | INTRAMUSCULAR | Status: DC | PRN

## 2015-04-24 MED ORDER — SODIUM CHLORIDE 0.9 % IJ SOLN
3.0000 mL | Freq: Two times a day (BID) | INTRAMUSCULAR | Status: DC
  Administered 2015-04-24 – 2015-04-25 (×2): 3 mL via INTRAVENOUS

## 2015-04-24 MED ORDER — DEXTROSE 10 % IV SOLN
INTRAVENOUS | Status: DC
  Administered 2015-04-24 – 2015-04-25 (×3): via INTRAVENOUS

## 2015-04-24 MED ORDER — HEPARIN SODIUM (PORCINE) 5000 UNIT/ML IJ SOLN
5000.0000 [IU] | Freq: Three times a day (TID) | INTRAMUSCULAR | Status: DC
Start: 2015-04-24 — End: 2015-04-26
  Administered 2015-04-24 – 2015-04-26 (×6): 5000 [IU] via SUBCUTANEOUS
  Filled 2015-04-24 (×6): qty 1

## 2015-04-24 MED ORDER — CALCIUM CHLORIDE 10 % IV SOLN
INTRAVENOUS | Status: AC | PRN
  Administered 2015-04-24: 1 g via INTRAVENOUS

## 2015-04-24 MED ORDER — DEXTROSE-NACL 5-0.9 % IV SOLN
INTRAVENOUS | Status: DC
  Administered 2015-04-24: 07:00:00 via INTRAVENOUS

## 2015-04-24 MED ORDER — ROCURONIUM BROMIDE 50 MG/5ML IV SOLN
INTRAVENOUS | Status: AC | PRN
  Administered 2015-04-24: 20 mg via INTRAVENOUS

## 2015-04-24 MED ORDER — SODIUM CHLORIDE 0.9 % IV SOLN: 250.0000 mL | INTRAVENOUS | Status: DC | PRN

## 2015-04-24 MED ORDER — PROPOFOL 1000 MG/100ML IV EMUL
INTRAVENOUS | Status: AC
  Administered 2015-04-24: 10 ug/min
  Filled 2015-04-24: qty 100

## 2015-04-24 MED ORDER — ROCURONIUM BROMIDE 50 MG/5ML IV SOLN
10.0000 mg | Freq: Once | INTRAVENOUS | Status: AC
  Administered 2015-04-24: 10 mg via INTRAVENOUS

## 2015-04-24 MED ORDER — DEXTROSE 50 % IV SOLN
25.0000 g | Freq: Once | INTRAVENOUS | Status: AC
  Administered 2015-04-24: 25 g via INTRAVENOUS

## 2015-04-24 MED ORDER — SIMETHICONE 80 MG PO CHEW
80.0000 mg | CHEWABLE_TABLET | Freq: Four times a day (QID) | ORAL | Status: DC
  Administered 2015-04-24 – 2015-04-26 (×7): 80 mg via ORAL
  Filled 2015-04-24 (×9): qty 1

## 2015-04-24 NOTE — ED Notes (Signed)
Pt to ED via EMS from [facility] c/o hypoglycemia and unresponsiveness.  Per EMS pt found face down for unknown time.  EMS states pt not eating for 2 days.  Pt has hx of diabetes and dialysis.  CBG at facility 38.  Pt given glucagon at facility and another en route via EMS.

## 2015-04-24 NOTE — Progress Notes (Signed)
RT to room to extubate patient.  Patient suctioned prior to extubation, tolerated well.  Placed on 2lpm [facility].

## 2015-04-24 NOTE — Consult Note (Signed)
[facility]  CARDIOLOGY CONSULT NOTE  Patient ID: [name] MRN: [id number] DOB/AGE: 04-29-1961 54 y.o.  Admit date: 04/24/2015 Referring Physician Bridgett Larsson Primary Physician Delight Stare Primary Cardiologist Serafina Royals M.D. Reason for Consultation bradycardia  HPI: 54 year old female referred for evaluation of bradycardia. The patient has multiple comorbidities including diabetes, coronary artery disease, and end-stage renal disease on chronic hemodialysis. The patient presented to [facility] emergency room with shortness of breath, hypoxemia, and generalized body pains. Patient was noted to be hypoglycemic, and hypothermic. Admission labs were notable for negative troponin. The patient was bradycardic, initial EKG revealing junctional bradycardia at a rate of 77 bpm. Following fluid resuscitation, embolization of electrolytes, and a heating blanket, patient's heart rate has improved to 75 bpm in sinus rhythm.  Review of systems complete and found to be negative unless listed above     Past Medical History  Diagnosis Date  . Asthma   . Collagen vascular disease   . Diabetes mellitus without complication   . Hypertension   . Coronary artery disease     a. cath 2013: stenting to RCA (report not available); b. cath 2014: LM nl, pLAD 40%, mLAD nl, ost LCx 40%, mid LCx nl, pRCA 30% @ site of prior stent, mRCA 50%  . Diabetic neuropathy   . ESRD (end stage renal disease) on dialysis     M-W-F  . GERD (gastroesophageal reflux disease)   . Hx of pancreatitis 2015  . Myocardial infarction   . Pneumonia   . Mitral regurgitation     a. echo 10/2013: EF 62%, noWMA, mildly dilated LA, mild to mod MR/TR, GR1DD  . COPD (chronic obstructive pulmonary disease)   . Acute diastolic CHF (congestive heart failure) 04/05/2015    Past Surgical History  Procedure Laterality Date  . Cholecystectomy    . Appendectomy    . Abdominal hysterectomy    . Dialysis fistula creation Left     upper arm  .  Esophagogastroduodenoscopy N/A 03/08/2015    Procedure: ESOPHAGOGASTRODUODENOSCOPY (EGD);  Surgeon: Manya Silvas, MD;  Location: [facility] ENDOSCOPY;  Service: Endoscopy;  Laterality: N/A;    Prescriptions prior to admission  Medication Sig Dispense Refill Last Dose  . amLODipine (NORVASC) 10 MG tablet Take 10 mg by mouth daily.   04/23/2015 at 0900  . aspirin EC 81 MG tablet Take 81 mg by mouth daily.   04/23/2015 at 0900  . atorvastatin (LIPITOR) 40 MG tablet Take 40 mg by mouth at bedtime.    04/23/2015 at 2100  . carvedilol (COREG) 25 MG tablet Take 25 mg by mouth 2 (two) times daily.   04/23/2015 at 1700  . cetirizine (ZYRTEC) 10 MG tablet Take 10 mg by mouth daily.   04/23/2015 at 0900  . clopidogrel (PLAVIX) 75 MG tablet Take 75 mg by mouth daily.   04/23/2015 at 1700  . docusate sodium (COLACE) 100 MG capsule Take 1 capsule (100 mg total) by mouth 2 (two) times daily. 10 capsule 0 04/23/2015 at 1700  . hydrALAZINE (APRESOLINE) 100 MG tablet Take 1 tablet (100 mg total) by mouth 3 (three) times daily. 90 tablet 1 04/23/2015 at 2100  . hydrocortisone cream 1 % Apply 1 application topically 2 (two) times daily.   04/23/2015 at 1700  . insulin aspart (NOVOLOG) 100 UNIT/ML injection Inject 2-10 Units into the skin 4 (four) times daily -  with meals and at bedtime. Per sliding scale   unknown at Unknown time  . insulin detemir (LEVEMIR) 100  UNIT/ML injection Inject 8 Units into the skin at bedtime.   04/23/2015 at 2100  . Liniments (SALONPAS ARTHRITIS PAIN RELIEF) PADS Apply 1 each topically 3 (three) times daily. (every shift)   04/23/2015 at Unknown time  . lipase/protease/amylase (CREON) 12000 UNITS CPEP capsule Take 24,000-48,000 Units by mouth 5 (five) times daily. 4 capsules 3 times daily with meals and 2 capsules 2 times daily with snacks   04/23/2015 at 1800carafat  . metoCLOPramide (REGLAN) 5 MG tablet Take 1 tablet (5 mg total) by mouth 4 (four) times daily -  before meals and at bedtime. 40 tablet 0 04/23/2015 at  2100  . Omega-3 Fatty Acids (FISH OIL) 1000 MG CAPS Take 1 capsule by mouth 2 (two) times daily.   04/23/2015 at 1700  . pantoprazole (PROTONIX) 40 MG tablet Take 1 tablet (40 mg total) by mouth 2 (two) times daily before a meal. 60 tablet 0 04/23/2015 at 1800  . polyethylene glycol (MIRALAX / GLYCOLAX) packet Take 17 g by mouth daily. 14 each 0 04/23/2015 at 0900  . promethazine (PHENERGAN) 12.5 MG tablet Take 12.5 mg by mouth every 6 (six) hours as needed for nausea or vomiting.   uknown at unknown  . ranitidine (ZANTAC) 150 MG tablet Take 150 mg by mouth 2 (two) times daily.   04/23/2015 at 2000  . ranolazine (RANEXA) 500 MG 12 hr tablet Take 1,000 mg by mouth 2 (two) times daily.   04/23/2015 at 1700  . senna (SENOKOT) 8.6 MG TABS tablet Take 1 tablet (8.6 mg total) by mouth daily as needed for mild constipation. (Patient taking differently: Take 1 tablet by mouth 2 (two) times daily. ) 120 each 0 04/23/2015 at 1700  . sucralfate (CARAFATE) 1 G tablet Take 1 tablet (1 g total) by mouth 4 (four) times daily -  with meals and at bedtime. 120 tablet 0 04/23/2015 at 2100  . Vitamin D, Cholecalciferol, 400 UNITS TABS Take 400 Units by mouth 2 (two) times daily.    04/23/2015 at 1700  . acetaminophen (TYLENOL) 325 MG tablet Take 2 tablets (650 mg total) by mouth every 6 (six) hours as needed. 60 tablet 0   . albuterol (PROVENTIL HFA;VENTOLIN HFA) 108 (90 BASE) MCG/ACT inhaler Inhale 4-6 puffs by mouth every 4 hours as needed for wheezing, cough, and/or shortness of breath (Patient taking differently: Inhale 4 puffs into the lungs every 4 (four) hours as needed for wheezing or shortness of breath. ) 1 Inhaler 1 uknown at unknown  . ALPRAZolam (XANAX) 0.25 MG tablet Take 1 tablet (0.25 mg total) by mouth 2 (two) times daily as needed for anxiety. (Patient taking differently: Take 0.25 mg by mouth every 12 (twelve) hours as needed for anxiety. ) 60 tablet 0 unknown at [facility]  . diazepam (VALIUM) 5 MG tablet Take 1 tablet (5  mg total) by mouth every 8 (eight) hours as needed for muscle spasms. 20 tablet 0 unknown at unknown  . HYDROcodone-acetaminophen (NORCO/VICODIN) 5-325 MG per tablet Take 1 tablet by mouth every 6 (six) hours as needed for moderate pain. 20 tablet 0 unknown at unknown  . nitroGLYCERIN (NITROSTAT) 0.4 MG SL tablet Place 0.4 mg under the tongue every 5 (five) minutes x 3 doses as needed for chest pain.    unknown at unknown  . ondansetron (ZOFRAN) 4 MG tablet Take 1 tablet (4 mg total) by mouth daily as needed for nausea or vomiting. 20 tablet 1 unknown at unknown   History   Social  History  . Marital Status: Divorced    Spouse Name: N/A  . Number of Children: N/A  . Years of Education: N/A   Occupational History  . Not on file.   Social History Main Topics  . Smoking status: Current Some Day Smoker -- 0.50 packs/day    Types: Cigarettes    Last Attempt to Quit: 02/13/2015  . Smokeless tobacco: Never Used  . Alcohol Use: No  . Drug Use: No  . Sexual Activity: Not on file   Other Topics Concern  . Not on file   Social History Narrative    Family History  Problem Relation Age of Onset  . Kidney disease Mother   . Diabetes Mother       Review of systems complete and found to be negative unless listed above      PHYSICAL EXAM  General: Well developed, well nourished, in no acute distress HEENT:  Normocephalic and atramatic Neck:  No JVD.  Lungs: Clear bilaterally to auscultation and percussion. Heart: HRRR . Normal S1 and S2 without gallops or murmurs.  Abdomen: Bowel sounds are positive, abdomen soft and non-tender  Msk:  Back normal, normal gait. Normal strength and tone for age. Extremities: No clubbing, cyanosis or edema.   Neuro: Alert and oriented X 3. Psych:  Good affect, responds appropriately  Labs:   Lab Results  Component Value Date   WBC 3.8 04/24/2015   HGB 7.9* 04/24/2015   HCT 24.7* 04/24/2015   MCV 86.7 04/24/2015   PLT 114* 04/24/2015     Recent Labs Lab 04/24/15 0528  NA 137  K 3.4*  CL 96*  CO2 36*  BUN 9  CREATININE 2.71*  CALCIUM 8.3*  PROT 5.7*  BILITOT 0.5  ALKPHOS 101  ALT 9*  AST 19  GLUCOSE 76   Lab Results  Component Value Date   CKTOTAL 56 03/05/2015   TROPONINI <0.03 04/24/2015    Lab Results  Component Value Date   CHOL 123 02/21/2015   CHOL 85 05/31/2014   Lab Results  Component Value Date   HDL 43 05/31/2014   Lab Results  Component Value Date   LDLCALC 21 05/31/2014   Lab Results  Component Value Date   TRIG 72 02/21/2015   TRIG 104 05/31/2014   No results found for: CHOLHDL No results found for: LDLDIRECT    Radiology: Dg Thoracic Spine 2 View  04/16/2015   CLINICAL DATA:  Ground level fall this evening  EXAM: THORACIC SPINE - 2-3 VIEWS  COMPARISON:  None.  FINDINGS: There is no evidence of thoracic spine fracture. Alignment is normal. No other significant bone abnormalities are identified.  IMPRESSION: Negative.   Electronically Signed   By: Andreas Newport M.D.   On: 04/16/2015 19:14   Ct Head Wo Contrast  04/24/2015   CLINICAL DATA:  Hypoglycemic.  Unresponsive.  EXAM: CT HEAD WITHOUT CONTRAST  TECHNIQUE: Contiguous axial images were obtained from the base of the skull through the vertex without intravenous contrast.  COMPARISON:  04/16/2015  FINDINGS: There is no intracranial hemorrhage, mass or evidence of acute infarction. There is no extra-axial fluid collection. Gray matter and white matter appear normal. Cerebral volume is normal for age. Brainstem and posterior fossa are unremarkable. The CSF spaces appear normal.  The bony structures are intact. There is a right maxillary sinus air-fluid level and right frontal sinus air-fluid level. The other paranasal sinuses are clear.  IMPRESSION: 1. Normal brain 2. Right maxillary and right frontal  sinus air-fluid levels.   Electronically Signed   By: Andreas Newport M.D.   On: 04/24/2015 06:39   Ct Head Wo Contrast  04/16/2015    CLINICAL DATA:  [place] today, striking occipital region. Now with right frontal headache.  EXAM: CT HEAD WITHOUT CONTRAST  TECHNIQUE: Contiguous axial images were obtained from the base of the skull through the vertex without intravenous contrast.  COMPARISON:  03/14/2015  FINDINGS: There is no intracranial hemorrhage, mass or evidence of acute infarction. There is no extra-axial fluid collection. Gray matter and white matter appear normal. Cerebral volume is normal for age. Brainstem and posterior fossa are unremarkable. The CSF spaces appear normal.  The bony structures are intact. The visible portions of the paranasal sinuses are clear.  IMPRESSION: No acute intracranial findings.  Normal brain.   Electronically Signed   By: Andreas Newport M.D.   On: 04/16/2015 19:07   Dg Chest Portable 1 View  04/24/2015   CLINICAL DATA:  Hypoglycemia, unresponsiveness. Endotracheal tube placement. History of diabetes and dialysis, hypertension, asthma, CHF, COPD.  EXAM: PORTABLE CHEST - 1 VIEW  COMPARISON:  Chest radiograph April 01, 2015  FINDINGS: Interval intubation, distal tip projects 4.7 cm above the carina.  Cardiac silhouette appears mildly enlarged, similar to prior examination. Central pulmonary vascular congestion and diffuse interstitial prominence. Small bilateral pleural effusions. Elevated LEFT hemidiaphragm with atelectasis. Apical pleural capping, no pneumothorax. LEFT subclavian vascular stent. Osseous structure nonsuspicious.  IMPRESSION: Endotracheal tube tip projects 4.7 cm above the carina.  Mild cardiomegaly, interstitial pulmonary edema with small pleural effusions. Elevated LEFT hemidiaphragm with atelectasis.   Electronically Signed   By: Elon Alas M.D.   On: 04/24/2015 06:21   Dg Chest Portable 1 View  04/01/2015   CLINICAL DATA:  Hypoxia and shortness of breath.  EXAM: PORTABLE CHEST - 1 VIEW  COMPARISON:  03/14/2015 and prior radiographs  FINDINGS: Cardiomegaly is identified.   Small-moderate bilateral pleural effusions and bibasilar atelectasis noted.  Pulmonary vascular congestion is present.  There is no evidence of pneumothorax.  Vascular stents overlying the left subclavian axillary region and right arm noted.  IMPRESSION: Cardiomegaly with pulmonary vascular congestion, small -moderate bilateral pleural effusions and bibasilar atelectasis.   Electronically Signed   By: Margarette Canada M.D.   On: 04/01/2015 14:00   Dg Knee Left Port  04/24/2015   CLINICAL DATA:  Pain for 1 day.  No history of trauma  EXAM: PORTABLE LEFT KNEE - 1-2 VIEW  COMPARISON:  None.  FINDINGS: Frontal and lateral views were obtained. No fracture or dislocation. No joint effusion. There is mild narrowing of the medial and lateral compartments. No erosive change.  IMPRESSION: Osteoarthritic change.  No fracture or effusion.   Electronically Signed   By: Lowella Grip III M.D.   On: 04/24/2015 16:30   Dg Abd 2 Views  04/10/2015   CLINICAL DATA:  Right upper quadrant region abdominal pain with nausea and vomiting for 1 day. Chronic renal failure.  EXAM: ABDOMEN - 2 VIEW  COMPARISON:  March 06, 2015  FINDINGS: Supine and upright images obtained. There is fairly diffuse stool throughout the colon. There is no bowel dilatation or air-fluid level suggesting obstruction. No free air. There are surgical clips in the right upper quadrant of the abdomen. There are small phleboliths in the left pelvis. There are pleural effusions and bibasilar atelectasis in the lung base regions.  IMPRESSION: Fairly diffuse stool throughout colon. Overall bowel gas pattern unremarkable without obstruction or  free air. Small pleural effusions with bibasilar atelectasis.   Electronically Signed   By: Lowella Grip III M.D.   On: 04/10/2015 08:41    EKG: Junctional bradycardia at a rate of 47 bpm  ASSESSMENT AND PLAN:   1. Junctional bradycardia, improved after the event of hypoglycemia and hypothermia. Patient currently in  sinus rhythm at a rate of 75 bpm. 2. Known coronary artery disease, currently without chest pain, with negative troponin  Recommendations  1. Defer temporary or permanent pacemaker at this time 2. Respiratory support per Dr.Kasa 3. Defer further cardiac diagnostics at this time 4. Continue to monitor heart rate  Signed: Deannie Resetar MD,PhD, [facility] 04/24/2015, 5:21 PM

## 2015-04-24 NOTE — Progress Notes (Signed)
Pt CO SOB, vitals signs, O2 and lung sounds are WDL. Pt on anti anxiety meds at home, Dr. Reece Levy ordered xanax, will reevaluate.

## 2015-04-24 NOTE — Progress Notes (Signed)
   04/24/15 0930  Clinical Encounter Type  Visited With Family  Visit Type Initial  Referral From Other (Comment)  Consult/Referral To Chaplain  Spiritual Encounters  Spiritual Needs Emotional  Stress Factors  Family Stress Factors Health changes  Chaplain was paged to support the family after patient was admitted into the unit. Offered support as applicable. Chaplain Tyreisha Ungar A. Velvet [place] Ext [phone]

## 2015-04-24 NOTE — H&P (Signed)
[facility] NAME: [name]    MR#:  [id number]  DATE OF BIRTH:  01-04-1961  DATE OF ADMISSION:  04/24/2015  PRIMARY CARE PHYSICIAN: Ellamae Sia, MD   REQUESTING/REFERRING PHYSICIAN: Gregor Hams, MD  CHIEF COMPLAINT:   Chief Complaint  Patient presents with  . Hypoglycemia   unresponsiveness and hypoglycemia this morning  HISTORY OF PRESENT ILLNESS:  [name]  is a 55 y.o. female with a known history of ESRD, hypertension, diabetes and CAD. The patient was sent from nursing home to the ED this morning due to unresponsiveness. The patient was intubated in the ED, unable to provide any information. According to Dr. Owens Shark, the patient was found unresponsive in nursing home at about 5:30 AM today. Her blood sugar was 20. She was treated with the D50 one amp IV in ED with improvement of glucose up to 120, but blood sugar decreased again to 48 required another amp of D50. In addition, the patient has hypothermia with a body temperature is a 86.8, was put on warm blanket. The patient also has bradycardia at 40s. She was intubated in ED and on D10 IV infusion. CAT scan of head is negative for infarct or hemorrhage. According to patient's son, patient was fine last night. She had episode of hypoglycemia in [employer]. She is not on any insulin treatment.  PAST MEDICAL HISTORY:   Past Medical History  Diagnosis Date  . Asthma   . Collagen vascular disease   . Diabetes mellitus without complication   . Hypertension   . Coronary artery disease     a. cath 2013: stenting to RCA (report not available); b. cath 2014: LM nl, pLAD 40%, mLAD nl, ost LCx 40%, mid LCx nl, pRCA 30% @ site of prior stent, mRCA 50%  . Diabetic neuropathy   . ESRD (end stage renal disease) on dialysis     M-W-F  . GERD (gastroesophageal reflux disease)   . Hx of pancreatitis 2015  . Myocardial infarction   . Pneumonia   . Mitral regurgitation      a. echo 10/2013: EF 62%, noWMA, mildly dilated LA, mild to mod MR/TR, GR1DD  . COPD (chronic obstructive pulmonary disease)   . Acute diastolic CHF (congestive heart failure) 04/05/2015    PAST SURGICAL HISTORY:   Past Surgical History  Procedure Laterality Date  . Cholecystectomy    . Appendectomy    . Abdominal hysterectomy    . Dialysis fistula creation Left     upper arm  . Esophagogastroduodenoscopy N/A 03/08/2015    Procedure: ESOPHAGOGASTRODUODENOSCOPY (EGD);  Surgeon: Manya Silvas, MD;  Location: [facility] ENDOSCOPY;  Service: Endoscopy;  Laterality: N/A;    SOCIAL HISTORY:   History  Substance Use Topics  . Smoking status: Current Some Day Smoker -- 0.50 packs/day    Types: Cigarettes    Last Attempt to Quit: 02/13/2015  . Smokeless tobacco: Never Used  . Alcohol Use: No    FAMILY HISTORY:   Family History  Problem Relation Age of Onset  . Kidney disease Mother   . Diabetes Mother     DRUG ALLERGIES:   Allergies  Allergen Reactions  . Ace Inhibitors Swelling  . Ativan [Lorazepam] Other (See Comments)    Hallucinations, headaches  . Codeine Nausea And Vomiting  . Compazine [Prochlorperazine Edisylate] Nausea And Vomiting  . Losartan Other (See Comments)  . Phenergan [Promethazine Hcl] Other (See Comments)  Hallucinations  . Zofran [Ondansetron Hcl] Other (See Comments)    Dementia sx  . Oxycodone Anxiety  . Tape Rash    REVIEW OF SYSTEMS:  Unable to obtain at this time due to unresponsiveness and intubation status.  MEDICATIONS AT HOME:   Prior to Admission medications   Medication Sig Start Date End Date Taking? Authorizing Provider  amLODipine (NORVASC) 10 MG tablet Take 10 mg by mouth daily.   Yes Historical Provider, MD  aspirin EC 81 MG tablet Take 81 mg by mouth daily.   Yes Historical Provider, MD  atorvastatin (LIPITOR) 40 MG tablet Take 40 mg by mouth at bedtime.    Yes Historical Provider, MD  carvedilol (COREG) 25 MG tablet Take 25  mg by mouth 2 (two) times daily.   Yes Historical Provider, MD  cetirizine (ZYRTEC) 10 MG tablet Take 10 mg by mouth daily.   Yes Historical Provider, MD  clopidogrel (PLAVIX) 75 MG tablet Take 75 mg by mouth daily.   Yes Historical Provider, MD  docusate sodium (COLACE) 100 MG capsule Take 1 capsule (100 mg total) by mouth 2 (two) times daily. 04/03/15  Yes Theodoro Grist, MD  hydrALAZINE (APRESOLINE) 100 MG tablet Take 1 tablet (100 mg total) by mouth 3 (three) times daily. 03/09/15  Yes Henreitta Leber, MD  hydrocortisone cream 1 % Apply 1 application topically 2 (two) times daily.   Yes Historical Provider, MD  insulin aspart (NOVOLOG) 100 UNIT/ML injection Inject 2-10 Units into the skin 4 (four) times daily -  with meals and at bedtime. Per sliding scale   Yes Historical Provider, MD  insulin detemir (LEVEMIR) 100 UNIT/ML injection Inject 8 Units into the skin at bedtime.   Yes Historical Provider, MD  Liniments [facility] ARTHRITIS PAIN RELIEF) PADS Apply 1 each topically 3 (three) times daily. (every shift)   Yes Historical Provider, MD  lipase/protease/amylase (CREON) 12000 UNITS CPEP capsule Take 24,000-48,000 Units by mouth 5 (five) times daily. 4 capsules 3 times daily with meals and 2 capsules 2 times daily with snacks   Yes Historical Provider, MD  metoCLOPramide (REGLAN) 5 MG tablet Take 1 tablet (5 mg total) by mouth 4 (four) times daily -  before meals and at bedtime. 02/24/15  Yes Srikar Sudini, MD  Omega-3 Fatty Acids (FISH OIL) 1000 MG CAPS Take 1 capsule by mouth 2 (two) times daily.   Yes Historical Provider, MD  pantoprazole (PROTONIX) 40 MG tablet Take 1 tablet (40 mg total) by mouth 2 (two) times daily before a meal. 02/24/15  Yes Srikar Sudini, MD  polyethylene glycol (MIRALAX / GLYCOLAX) packet Take 17 g by mouth daily. 04/10/15  Yes Earleen Newport, MD  promethazine (PHENERGAN) 12.5 MG tablet Take 12.5 mg by mouth every 6 (six) hours as needed for nausea or vomiting.   Yes  Historical Provider, MD  ranitidine (ZANTAC) 150 MG tablet Take 150 mg by mouth 2 (two) times daily.   Yes Historical Provider, MD  ranolazine (RANEXA) 500 MG 12 hr tablet Take 1,000 mg by mouth 2 (two) times daily.   Yes Historical Provider, MD  senna (SENOKOT) 8.6 MG TABS tablet Take 1 tablet (8.6 mg total) by mouth daily as needed for mild constipation. Patient taking differently: Take 1 tablet by mouth 2 (two) times daily.  04/03/15  Yes Theodoro Grist, MD  sucralfate (CARAFATE) 1 G tablet Take 1 tablet (1 g total) by mouth 4 (four) times daily -  with meals and at bedtime. 02/24/15  Yes [name], MD  Vitamin D, Cholecalciferol, 400 UNITS TABS Take 400 Units by mouth 2 (two) times daily.    Yes Historical Provider, MD  acetaminophen (TYLENOL) 325 MG tablet Take 2 tablets (650 mg total) by mouth every 6 (six) hours as needed. 04/16/15   Carrie Mew, MD  albuterol (PROVENTIL HFA;VENTOLIN HFA) 108 (90 BASE) MCG/ACT inhaler Inhale 4-6 puffs by mouth every 4 hours as needed for wheezing, cough, and/or shortness of breath Patient taking differently: Inhale 4 puffs into the lungs every 4 (four) hours as needed for wheezing or shortness of breath.  02/17/15   Hinda Kehr, MD  ALPRAZolam Duanne Moron) 0.25 MG tablet Take 1 tablet (0.25 mg total) by mouth 2 (two) times daily as needed for anxiety. Patient taking differently: Take 0.25 mg by mouth every 12 (twelve) hours as needed for anxiety.  03/14/15   Gonzella Lex, MD  diazepam (VALIUM) 5 MG tablet Take 1 tablet (5 mg total) by mouth every 8 (eight) hours as needed for muscle spasms. 04/10/15   Earleen Newport, MD  HYDROcodone-acetaminophen (NORCO/VICODIN) 5-325 MG per tablet Take 1 tablet by mouth every 6 (six) hours as needed for moderate pain. 02/08/15   Earleen Newport, MD  nitroGLYCERIN (NITROSTAT) 0.4 MG SL tablet Place 0.4 mg under the tongue every 5 (five) minutes x 3 doses as needed for chest pain.     Historical Provider, MD  ondansetron  (ZOFRAN) 4 MG tablet Take 1 tablet (4 mg total) by mouth daily as needed for nausea or vomiting. 02/08/15   Earleen Newport, MD      VITAL SIGNS:  Blood pressure 104/52, pulse 38, temperature 86.8 F (30.4 C), temperature source Rectal, resp. rate 16, SpO2 100 %.  PHYSICAL EXAMINATION:  GENERAL:  54 y.o.-year-old patient lying in the bed with ventilation. Obese, critical-ill looking. On ventilation. EYES: Pupils equal, round, reactive to light and accommodation. No scleral icterus. Extraocular muscles intact.  HEENT: Head atraumatic, normocephalic. Pupils are round, right pupils 4 mm and left pupils 2 mm,  not reactive to light. NECK:  Supple, no jugular venous distention. No thyroid enlargement, no tenderness.  LUNGS: Normal breath sounds bilaterally, no wheezing, rales,rhonchi or crepitation. No use of accessory muscles of respiration.  CARDIOVASCULAR: S1, S2 bradycardia at 40. No murmurs, rubs, or gallops.  ABDOMEN: Soft, nontender, nondistended. Bowel sounds are hypoactive. No organomegaly or mass.  EXTREMITIES: No pedal edema, cyanosis, or clubbing.  NEUROLOGIC: Unresponsive. PSYCHIATRIC: Unresponsive.  SKIN: No obvious rash, lesion, or ulcer. Cold skin but no cyanosis. On warm blanket.  LABORATORY PANEL:   CBC  Recent Labs Lab 04/24/15 0528  WBC 3.8  HGB 7.9*  HCT 24.7*  PLT 114*   ------------------------------------------------------------------------------------------------------------------  Chemistries   Recent Labs Lab 04/24/15 0528  NA 137  K 3.4*  CL 96*  CO2 36*  GLUCOSE 76  BUN 9  CREATININE 2.71*  CALCIUM 8.3*  AST 19  ALT 9*  ALKPHOS 101  BILITOT 0.5   ------------------------------------------------------------------------------------------------------------------  Cardiac Enzymes  Recent Labs Lab 04/24/15 0528  TROPONINI <0.03    ------------------------------------------------------------------------------------------------------------------  RADIOLOGY:  Ct Head Wo Contrast  04/24/2015   CLINICAL DATA:  Hypoglycemic.  Unresponsive.  EXAM: CT HEAD WITHOUT CONTRAST  TECHNIQUE: Contiguous axial images were obtained from the base of the skull through the vertex without intravenous contrast.  COMPARISON:  04/16/2015  FINDINGS: There is no intracranial hemorrhage, mass or evidence of acute infarction. There is no extra-axial fluid collection. Pearline Cables matter and  white matter appear normal. Cerebral volume is normal for age. Brainstem and posterior fossa are unremarkable. The CSF spaces appear normal.  The bony structures are intact. There is a right maxillary sinus air-fluid level and right frontal sinus air-fluid level. The other paranasal sinuses are clear.  IMPRESSION: 1. Normal brain 2. Right maxillary and right frontal sinus air-fluid levels.   Electronically Signed   By: Andreas Newport M.D.   On: 04/24/2015 06:39   Dg Chest Portable 1 View  04/24/2015   CLINICAL DATA:  Hypoglycemia, unresponsiveness. Endotracheal tube placement. History of diabetes and dialysis, hypertension, asthma, CHF, COPD.  EXAM: PORTABLE CHEST - 1 VIEW  COMPARISON:  Chest radiograph April 01, 2015  FINDINGS: Interval intubation, distal tip projects 4.7 cm above the carina.  Cardiac silhouette appears mildly enlarged, similar to prior examination. Central pulmonary vascular congestion and diffuse interstitial prominence. Small bilateral pleural effusions. Elevated LEFT hemidiaphragm with atelectasis. Apical pleural capping, no pneumothorax. LEFT subclavian vascular stent. Osseous structure nonsuspicious.  IMPRESSION: Endotracheal tube tip projects 4.7 cm above the carina.  Mild cardiomegaly, interstitial pulmonary edema with small pleural effusions. Elevated LEFT hemidiaphragm with atelectasis.   Electronically Signed   By: Elon Alas M.D.   On:  04/24/2015 06:21    EKG:   Orders placed or performed during the hospital encounter of 04/10/15  . EKG    IMPRESSION AND PLAN:  Unresponsiveness Acute metabolic encephalopathy Hypoglycemia Hypothymia Bradycardia ESRD Hypertension Diabetes  The patient will be admitted to ICU. I will continue ventilation and get critical care physician consult. Continue D10 IV infusion and follow-up blood sugar every hour. Continue warm blanket and a follow-up body temperature. I will get a nephrology consult for ESRD. I will hold hypertension medication due to low blood pressure and hypothymia. For bradycardia, possible due to hypoglycemia and hypothermia, I will hold Coreg and Norvasc, get a cardiology consult.  All the records are reviewed and case discussed with ED provider. The patient is in very critical condition and has a high risk for cardiac arrest.  Management plans discussed with the patient's son and they are in agreement.  CODE STATUS: Full code  TOTALCRITICAL  TIME TAKING CARE OF THIS PATIENT: 65 minutes.    Demetrios Loll M.D on 04/24/2015 at 7:39 AM  Between 7am to 6pm - Pager - [phone]  After 6pm go to [email or web address] - password [email or web address] [facility]  Office  [phone]  CC: Primary care physician; Ellamae Sia, MD

## 2015-04-24 NOTE — Progress Notes (Signed)
Subjective:   Admitted for hypoglycemia and hypothermia. Patient found unresponsive at nursing home. Blood glucose of 20. Intubated for airway protection, D50 given. Placed on warming blanket Currently not on insulin.  Heart rate in the 40's   Objective:  Vital signs in last 24 hours:  Temp:  [86.4 F (30.2 C)-87.4 F (30.8 C)] 87.4 F (30.8 C) (08/02 0900) Pulse Rate:  [38-47] 45 (08/02 0900) Resp:  [14-20] 16 (08/02 0900) BP: (104-136)/(51-91) 121/60 mmHg (08/02 0900) SpO2:  [100 %] 100 % (08/02 0900) FiO2 (%):  [30 %-50 %] 30 % (08/02 0904)  Weight change:  There were no vitals filed for this visit.  Intake/Output:       Physical Exam: General: Critically ill  HEENT Intubated, eyes open  Neck supple  Pulm/lungs Clear bilaterally, mechanical ventilation PRVC FiO2 30%  CVS/Heart bradycardia  Abdomen:  Soft, obese  Extremities: Trace edema  Neurologic: sedated  Skin: No acute rashes  Access: AVG       Basic Metabolic Panel:  Recent Labs Lab 04/24/15 0528  NA 137  K 3.4*  CL 96*  CO2 36*  GLUCOSE 76  BUN 9  CREATININE 2.71*  CALCIUM 8.3*     CBC:  Recent Labs Lab 04/24/15 0528  WBC 3.8  HGB 7.9*  HCT 24.7*  MCV 86.7  PLT 114*      Microbiology: Results for orders placed or performed during the hospital encounter of 02/19/15  Blood culture (routine x 2)     Status: None   Collection Time: 02/19/15 12:20 PM  Result Value Ref Range Status   Specimen Description BLOOD  Final   Special Requests Immunocompromised  Final   Culture NO GROWTH 5 DAYS  Final   Report Status 02/24/2015 FINAL  Final  Blood culture (routine x 2)     Status: None   Collection Time: 02/19/15 12:30 PM  Result Value Ref Range Status   Specimen Description BLOOD  Final   Special Requests Immunocompromised  Final   Culture NO GROWTH 5 DAYS  Final   Report Status 02/24/2015 FINAL  Final  Body fluid culture     Status: None   Collection Time: 02/20/15  2:55 PM   Result Value Ref Range Status   Specimen Description PLEURAL  Final   Special Requests NONE  Final   Gram Stain   Final    FEW WBC SEEN RARE RED BLOOD CELLS FEW GRAM VARIABLE ROD    Culture NO GROWTH 4 DAYS  Final   Report Status 02/24/2015 FINAL  Final  Body fluid culture     Status: None   Collection Time: 02/21/15  4:30 PM  Result Value Ref Range Status   Specimen Description PLEURAL  Final   Special Requests NONE  Final   Gram Stain MANY WBC SEEN NO ORGANISMS SEEN   Final   Culture NO GROWTH 3 DAYS  Final   Report Status 02/26/2015 FINAL  Final    Coagulation Studies: No results for input(s): LABPROT, INR in the last 72 hours.  Urinalysis:  Recent Labs  04/24/15 0753  COLORURINE YELLOW*  LABSPEC 1.009  PHURINE 9.0*  GLUCOSEU NEGATIVE  HGBUR NEGATIVE  BILIRUBINUR NEGATIVE  KETONESUR NEGATIVE  PROTEINUR 100*  NITRITE NEGATIVE  LEUKOCYTESUR NEGATIVE      Imaging: Ct Head Wo Contrast  04/24/2015   CLINICAL DATA:  Hypoglycemic.  Unresponsive.  EXAM: CT HEAD WITHOUT CONTRAST  TECHNIQUE: Contiguous axial images were obtained from the base of the skull  through the vertex without intravenous contrast.  COMPARISON:  04/16/2015  FINDINGS: There is no intracranial hemorrhage, mass or evidence of acute infarction. There is no extra-axial fluid collection. Gray matter and white matter appear normal. Cerebral volume is normal for age. Brainstem and posterior fossa are unremarkable. The CSF spaces appear normal.  The bony structures are intact. There is a right maxillary sinus air-fluid level and right frontal sinus air-fluid level. The other paranasal sinuses are clear.  IMPRESSION: 1. Normal brain 2. Right maxillary and right frontal sinus air-fluid levels.   Electronically Signed   By: Andreas Newport M.D.   On: 04/24/2015 06:39   Dg Chest Portable 1 View  04/24/2015   CLINICAL DATA:  Hypoglycemia, unresponsiveness. Endotracheal tube placement. History of diabetes and  dialysis, hypertension, asthma, CHF, COPD.  EXAM: PORTABLE CHEST - 1 VIEW  COMPARISON:  Chest radiograph April 01, 2015  FINDINGS: Interval intubation, distal tip projects 4.7 cm above the carina.  Cardiac silhouette appears mildly enlarged, similar to prior examination. Central pulmonary vascular congestion and diffuse interstitial prominence. Small bilateral pleural effusions. Elevated LEFT hemidiaphragm with atelectasis. Apical pleural capping, no pneumothorax. LEFT subclavian vascular stent. Osseous structure nonsuspicious.  IMPRESSION: Endotracheal tube tip projects 4.7 cm above the carina.  Mild cardiomegaly, interstitial pulmonary edema with small pleural effusions. Elevated LEFT hemidiaphragm with atelectasis.   Electronically Signed   By: Elon Alas M.D.   On: 04/24/2015 06:21     Medications:   . dextrose 100 mL/hr at 04/24/15 0807  . dextrose 5 % and 0.9% NaCl Stopped (04/24/15 0730)  . fentaNYL infusion INTRAVENOUS     . heparin  5,000 Units Subcutaneous 3 times per day  . sodium chloride  3 mL Intravenous Q12H   sodium chloride, acetaminophen **OR** acetaminophen, sodium chloride  Assessment/ Plan:  54 y.o. black female with ESRD, DM (30 yrs with retinopathy, gastroparesis, neuropathy), OA, IBS, hyperlipidemia, h/o GI bleed, SHPTH, AOCD, diastolic heart failure ,hypertension, hyperlipidemia, anemia, CAD  Admitted on 04/24/2015 for hypogycemia, hypothermia, and bradycardia.   [facility] MWF 1st shift [place]   1. ESRD: hemodialysis yesterday. No events on treatment. Potassium at goal. No indication for dialysis currently. Will monitor daily for dialysis need.  Tentatively for dialysis tomorrow.  - monitor volume status.   2. Anemia of chronic kidney disease: on micera as an outpatient. Hemoglobin low at 7.9 - epo with HD treatment.   3. Secondary Hyperparathyroidism: on 3 tabs renvela as outpatient. Phos 2.8 on 7/21 as outpatient. PTH 171.  - holding binders as patient  intubated.   4 Hypotension with bradycardia: could be due to hypothermia. Concerned for sepsis. Blood cultures sent.  - holding home hypertensive agents.     LOS: 0 [name] 8/2/20169:18 AM

## 2015-04-24 NOTE — ED Provider Notes (Addendum)
[facility] Emergency Department Provider Note  ____________________________________________  Time seen: 5:30 AM  I have reviewed the triage vital signs and the nursing notes.   HISTORY  Chief Complaint Hypoglycemia  History obtained from EMS secondary to patient being unresponsive    HPI [name] is a 54 y.o. female present via EMS from [facility]. Per EMS patient was found unresponsive on the floor by nursing home staff. Call the patient's son who notified me that he spoke with his mother at approximately 7 PM yesterday and at that point she was in her normal state of health and conversant and appropriate. Patient's son informed me that she's had episodes of hypoglycemia while at [facility]. He voices displeasure with "their slow response in regards to his mother's calls and oftentimes "she waits forever before anyone attends to her".     Past Medical History  Diagnosis Date  . Asthma   . Collagen vascular disease   . Diabetes mellitus without complication   . Hypertension   . Coronary artery disease     a. cath 2013: stenting to RCA (report not available); b. cath 2014: LM nl, pLAD 40%, mLAD nl, ost LCx 40%, mid LCx nl, pRCA 30% @ site of prior stent, mRCA 50%  . Diabetic neuropathy   . ESRD (end stage renal disease) on dialysis     M-W-F  . GERD (gastroesophageal reflux disease)   . Hx of pancreatitis 2015  . Myocardial infarction   . Pneumonia   . Mitral regurgitation     a. echo 10/2013: EF 62%, noWMA, mildly dilated LA, mild to mod MR/TR, GR1DD  . COPD (chronic obstructive pulmonary disease)   . Acute diastolic CHF (congestive heart failure) 04/05/2015    Patient Active Problem List   Diagnosis Date Noted  . Acute diastolic CHF (congestive heart failure) 04/05/2015  . Diabetic gastroparesis 04/05/2015  . Hypokalemia 04/05/2015  . Generalized weakness 04/05/2015  . Acute pulmonary edema 04/03/2015  . Nausea and  vomiting 04/03/2015  . Hypoglycemia associated with diabetes 04/03/2015  . Anemia of chronic disease 04/03/2015  . Secondary hyperparathyroidism 04/03/2015  . Pressure ulcer 04/02/2015  . Acute respiratory failure with hypoxia 04/01/2015  . Adjustment disorder with anxiety 03/14/2015  . Somatic symptom disorder, mild 03/08/2015  . Coronary artery disease involving native coronary artery of native heart without angina pectoris   . Nausea & vomiting 03/06/2015  . Abdominal pain 03/06/2015  . DM (diabetes mellitus) 03/06/2015  . HTN (hypertension) 03/06/2015  . Gastroparesis 02/24/2015  . Pleural effusion 02/19/2015  . HCAP (healthcare-associated pneumonia) 02/19/2015  . End-stage renal disease on hemodialysis 02/19/2015    Past Surgical History  Procedure Laterality Date  . Cholecystectomy    . Appendectomy    . Abdominal hysterectomy    . Dialysis fistula creation Left     upper arm  . Esophagogastroduodenoscopy N/A 03/08/2015    Procedure: ESOPHAGOGASTRODUODENOSCOPY (EGD);  Surgeon: Manya Silvas, MD;  Location: [facility] ENDOSCOPY;  Service: Endoscopy;  Laterality: N/A;    Current Outpatient Rx  Name  Route  Sig  Dispense  Refill  . amLODipine (NORVASC) 10 MG tablet   Oral   Take 10 mg by mouth daily.         Marland Kitchen aspirin EC 81 MG tablet   Oral   Take 81 mg by mouth daily.         Marland Kitchen atorvastatin (LIPITOR) 40 MG tablet   Oral   Take  40 mg by mouth at bedtime.          . carvedilol (COREG) 25 MG tablet   Oral   Take 25 mg by mouth 2 (two) times daily.         . cetirizine (ZYRTEC) 10 MG tablet   Oral   Take 10 mg by mouth daily.         . clopidogrel (PLAVIX) 75 MG tablet   Oral   Take 75 mg by mouth daily.         Marland Kitchen docusate sodium (COLACE) 100 MG capsule   Oral   Take 1 capsule (100 mg total) by mouth 2 (two) times daily.   10 capsule   0   . hydrALAZINE (APRESOLINE) 100 MG tablet   Oral   Take 1 tablet (100 mg total) by mouth 3 (three) times  daily.   90 tablet   1   . hydrocortisone cream 1 %   Topical   Apply 1 application topically 2 (two) times daily.         . insulin aspart (NOVOLOG) 100 UNIT/ML injection   Subcutaneous   Inject 2-10 Units into the skin 4 (four) times daily -  with meals and at bedtime. Per sliding scale         . insulin detemir (LEVEMIR) 100 UNIT/ML injection   Subcutaneous   Inject 8 Units into the skin at bedtime.         . Liniments (SALONPAS ARTHRITIS PAIN RELIEF) PADS   Apply externally   Apply 1 each topically 3 (three) times daily. (every shift)         . lipase/protease/amylase (CREON) 12000 UNITS CPEP capsule   Oral   Take 24,000-48,000 Units by mouth 5 (five) times daily. 4 capsules 3 times daily with meals and 2 capsules 2 times daily with snacks         . metoCLOPramide (REGLAN) 5 MG tablet   Oral   Take 1 tablet (5 mg total) by mouth 4 (four) times daily -  before meals and at bedtime.   40 tablet   0   . Omega-3 Fatty Acids (FISH OIL) 1000 MG CAPS   Oral   Take 1 capsule by mouth 2 (two) times daily.         . pantoprazole (PROTONIX) 40 MG tablet   Oral   Take 1 tablet (40 mg total) by mouth 2 (two) times daily before a meal.   60 tablet   0   . polyethylene glycol (MIRALAX / GLYCOLAX) packet   Oral   Take 17 g by mouth daily.   14 each   0   . promethazine (PHENERGAN) 12.5 MG tablet   Oral   Take 12.5 mg by mouth every 6 (six) hours as needed for nausea or vomiting.         . ranitidine (ZANTAC) 150 MG tablet   Oral   Take 150 mg by mouth 2 (two) times daily.         . ranolazine (RANEXA) 500 MG 12 hr tablet   Oral   Take 1,000 mg by mouth 2 (two) times daily.         Marland Kitchen senna (SENOKOT) 8.6 MG TABS tablet   Oral   Take 1 tablet (8.6 mg total) by mouth daily as needed for mild constipation. Patient taking differently: Take 1 tablet by mouth 2 (two) times daily.    120 each   0   .  sucralfate (CARAFATE) 1 G tablet   Oral   Take 1  tablet (1 g total) by mouth 4 (four) times daily -  with meals and at bedtime.   120 tablet   0   . Vitamin D, Cholecalciferol, 400 UNITS TABS   Oral   Take 400 Units by mouth 2 (two) times daily.          Marland Kitchen acetaminophen (TYLENOL) 325 MG tablet   Oral   Take 2 tablets (650 mg total) by mouth every 6 (six) hours as needed.   60 tablet   0   . albuterol (PROVENTIL HFA;VENTOLIN HFA) 108 (90 BASE) MCG/ACT inhaler      Inhale 4-6 puffs by mouth every 4 hours as needed for wheezing, cough, and/or shortness of breath Patient taking differently: Inhale 4 puffs into the lungs every 4 (four) hours as needed for wheezing or shortness of breath.    1 Inhaler   1   . ALPRAZolam (XANAX) 0.25 MG tablet   Oral   Take 1 tablet (0.25 mg total) by mouth 2 (two) times daily as needed for anxiety. Patient taking differently: Take 0.25 mg by mouth every 12 (twelve) hours as needed for anxiety.    60 tablet   0   . diazepam (VALIUM) 5 MG tablet   Oral   Take 1 tablet (5 mg total) by mouth every 8 (eight) hours as needed for muscle spasms.   20 tablet   0   . HYDROcodone-acetaminophen (NORCO/VICODIN) 5-325 MG per tablet   Oral   Take 1 tablet by mouth every 6 (six) hours as needed for moderate pain.   20 tablet   0   . nitroGLYCERIN (NITROSTAT) 0.4 MG SL tablet   Sublingual   Place 0.4 mg under the tongue every 5 (five) minutes x 3 doses as needed for chest pain.          Marland Kitchen ondansetron (ZOFRAN) 4 MG tablet   Oral   Take 1 tablet (4 mg total) by mouth daily as needed for nausea or vomiting.   20 tablet   1     Allergies Ace inhibitors; Ativan; Codeine; Compazine; Losartan; Phenergan; Zofran; Oxycodone; and Tape  Family History  Problem Relation Age of Onset  . Kidney disease Mother   . Diabetes Mother     Social History History  Substance Use Topics  . Smoking status: Current Some Day Smoker -- 0.50 packs/day    Types: Cigarettes    Last Attempt to Quit: 02/13/2015  .  Smokeless tobacco: Never Used  . Alcohol Use: No    Review of Systems: (Per Son and EMS)   Constitutional: Negative for fever. Eyes: Negative for visual changes. ENT: Negative for sore throat. Cardiovascular: Negative for chest pain. Respiratory: Negative for shortness of breath. Gastrointestinal: Negative for abdominal pain, vomiting and diarrhea. Genitourinary: Negative for dysuria. Musculoskeletal: Negative for back pain. Skin: Negative for rash. Neurological: Positive for unresponsiveness   10-point ROS otherwise negative.  ____________________________________________   PHYSICAL EXAM:  VITAL SIGNS: ED Triage Vitals  Enc Vitals Group     BP 04/24/15 0530 105/55 mmHg     Pulse Rate 04/24/15 0530 47     Resp 04/24/15 0530 16     Temp 04/24/15 0530 86.8 F (30.4 C)     Temp Source 04/24/15 0530 Rectal     SpO2 04/24/15 0530 100 %     Weight --      Height --  Head Cir --      Peak Flow --      Pain Score --      Pain Loc --      Pain Edu? --      Excl. in [place]? --      Constitutional: Unresponsive      Eyes: No corneal reflex      ENT: unremarkable   Head: Left periorbital edema   Nose: No congestion/rhinnorhea.   Mouth/Throat: Mucous membranes are moist. No gag reflex   Neck: No stridor. Cardiovascular: Bradycardia. Normal and symmetric distal pulses are present in all extremities. No murmurs, rubs, or gallops. Respiratory: bradypnea, no gag reflex, rhonchi bibasilar Gastrointestinal: Soft and nontender. No distention. There is no CVA tenderness. Genitourinary: deferred Musculoskeletal: Nontender with normal range of motion in all extremities. No joint effusions.  No lower extremity tenderness nor edema. Neurologic:  Unresponsive to verbal or painful stimuli. No corneal reflex  Skin:  Skin is warm, dry and intact. No rash noted.  ____________________________________________    LABS (pertinent positives/negatives)  Labs Reviewed   GLUCOSE, CAPILLARY - Abnormal; Notable for the following:    Glucose-Capillary 120 (*)    All other components within normal limits  CBC - Abnormal; Notable for the following:    RBC 2.85 (*)    Hemoglobin 7.9 (*)    HCT 24.7 (*)    RDW 16.5 (*)    Platelets 114 (*)    All other components within normal limits  COMPREHENSIVE METABOLIC PANEL - Abnormal; Notable for the following:    Potassium 3.4 (*)    Chloride 96 (*)    CO2 36 (*)    Creatinine, Ser 2.71 (*)    Calcium 8.3 (*)    Total Protein 5.7 (*)    Albumin 2.3 (*)    ALT 9 (*)    GFR calc non Af Amer 19 (*)    GFR calc Af Amer 22 (*)    All other components within normal limits  BLOOD GAS, ARTERIAL - Abnormal; Notable for the following:    pH, Arterial 7.62 (*)    pO2, Arterial 267 (*)    Bicarbonate 40.1 (*)    Acid-Base Excess 17.2 (*)    Allens test (pass/fail) POSITIVE (*)    All other components within normal limits  GLUCOSE, CAPILLARY - Abnormal; Notable for the following:    Glucose-Capillary 48 (*)    All other components within normal limits  TROPONIN I  URINALYSIS COMPLETEWITH MICROSCOPIC ([facility] ONLY)  LACTIC ACID, PLASMA  LACTIC ACID, PLASMA     ____________________________________________   EKG  ED ECG REPORT I, Nataliyah Packham, [place], the attending physician, personally viewed and interpreted this ECG.   Date: 04/24/2015  EKG Time: 5:37 AM  Rate: 47  Rhythm: Sinus bradycardia  Axis: None  Intervals: Normal  ST&T Change: None   ____________________________________________    RADIOLOGY CT HEAD WITHOUT CONTRAST  TECHNIQUE: Contiguous axial images were obtained from the base of the skull through the vertex without intravenous contrast.  COMPARISON: 04/16/2015  FINDINGS: There is no intracranial hemorrhage, mass or evidence of acute infarction. There is no extra-axial fluid collection. Gray matter and white matter appear normal. Cerebral volume is normal for age. Brainstem and  posterior fossa are unremarkable. The CSF spaces appear normal.  The bony structures are intact. There is a right maxillary sinus air-fluid level and right frontal sinus air-fluid level. The other paranasal sinuses are clear.  IMPRESSION: 1. Normal brain 2. Right  maxillary and right frontal sinus air-fluid levels.   Electronically Signed By: Andreas Newport M.D. On: 04/24/2015 06:39          DG Chest Portable 1 View (Final result) Result time: 04/24/15 [id number]   Final result by Rad Results In Interface (04/24/15 [id number])   Narrative:   CLINICAL DATA: Hypoglycemia, unresponsiveness. Endotracheal tube placement. History of diabetes and dialysis, hypertension, asthma, CHF, COPD.  EXAM: PORTABLE CHEST - 1 VIEW  COMPARISON: Chest radiograph April 01, 2015  FINDINGS: Interval intubation, distal tip projects 4.7 cm above the carina.  Cardiac silhouette appears mildly enlarged, similar to prior examination. Central pulmonary vascular congestion and diffuse interstitial prominence. Small bilateral pleural effusions. Elevated LEFT hemidiaphragm with atelectasis. Apical pleural capping, no pneumothorax. LEFT subclavian vascular stent. Osseous structure nonsuspicious.  IMPRESSION: Endotracheal tube tip projects 4.7 cm above the carina.  Mild cardiomegaly, interstitial pulmonary edema with small pleural effusions. Elevated LEFT hemidiaphragm with atelectasis.   Electronically Signed By: Elon Alas M.D. On: 04/24/2015 06:21     ____________________________________________   PROCEDURES  Procedure(s) performed: INTUBATION Performed by: Gregor Hams  Required items: required blood products, implants, devices, and special equipment available Patient identity confirmed: provided demographic data and hospital-assigned identification number Time out: Immediately prior to procedure a "time out" was called to verify the correct patient,  procedure, equipment, support staff and site/side marked as required.  Indications: Obtunded, airway protection   Intubation method: Direct Visualization  Preoxygenation: BVM  Sedatives: 20mg  Etomidate Paralytic: 10 mg rocuronium   Tube Size: 7.5 cuffed  Post-procedure assessment: chest rise and ETCO2 monitor Breath sounds: equal and absent over the epigastrium Tube secured with: ETT holder Chest x-ray interpreted by radiologist and me.  Chest x-ray findings: endotracheal tube in appropriate position  Patient tolerated the procedure well with no immediate complications.    Critical Care performed: 60 minutes  ____________________________________________   INITIAL IMPRESSION / ASSESSMENT AND PLAN / ED COURSE  Pertinent labs & imaging results that were available during my care of the patient were reviewed by me and considered in my medical decision making (see chart for details).  Patient received 1 amp (25 g) of dextrose IV on presentation to the emergency department improvement of glucose to 120. However patient's glucose decreased again to 48 required another amp of D50. Patient was intubated for airway protection as patient did not become responsive following dextrose administration. Regarding hypothermia. However was applied with blankets. Temperature improvement prior to admission. Patient was discussed with Dr. Bridgett Larsson for hospital admission  ____________________________________________   FINAL CLINICAL IMPRESSION(S) / ED DIAGNOSES  Final diagnoses:  Hypoglycemia  Syncope, unspecified syncope type  Respiratory failure with hypoxia, unspecified chronicity  Hypothermia, initial encounter      Gregor Hams, MD 04/24/15 0732  Gregor Hams, MD 04/24/15 [phone]

## 2015-04-24 NOTE — Consult Note (Signed)
PULMONARY / CRITICAL CARE MEDICINE   Name: [name] MRN: [id number] DOB: [birth year]    ADMISSION DATE:  04/24/2015   CHIEF COMPLAINT:  Acute resp failure   HISTORY OF PRESENT ILLNESS   54 yo AAF admitted to ICU for acute resp failure after being found unresponsive at NH with very low blood sugars Patient intubated,sedated placed on d10 infusion, patient has h/o ESRD on HD  Patient on full vent support, hypothermic and critically ill   SIGNIFICANT EVENTS   Intubated 8/1    PAST MEDICAL HISTORY    :  Past Medical History  Diagnosis Date  . Asthma   . Collagen vascular disease   . Diabetes mellitus without complication   . Hypertension   . Coronary artery disease     a. cath 2013: stenting to RCA (report not available); b. cath 2014: LM nl, pLAD 40%, mLAD nl, ost LCx 40%, mid LCx nl, pRCA 30% @ site of prior stent, mRCA 50%  . Diabetic neuropathy   . ESRD (end stage renal disease) on dialysis     M-W-F  . GERD (gastroesophageal reflux disease)   . Hx of pancreatitis 2015  . Myocardial infarction   . Pneumonia   . Mitral regurgitation     a. echo 10/2013: EF 62%, noWMA, mildly dilated LA, mild to mod MR/TR, GR1DD  . COPD (chronic obstructive pulmonary disease)   . Acute diastolic CHF (congestive heart failure) 04/05/2015   Past Surgical History  Procedure Laterality Date  . Cholecystectomy    . Appendectomy    . Abdominal hysterectomy    . Dialysis fistula creation Left     upper arm  . Esophagogastroduodenoscopy N/A 03/08/2015    Procedure: ESOPHAGOGASTRODUODENOSCOPY (EGD);  Surgeon: Manya Silvas, MD;  Location: [facility] ENDOSCOPY;  Service: Endoscopy;  Laterality: N/A;   Prior to Admission medications   Medication Sig Start Date End Date Taking? Authorizing Provider  amLODipine (NORVASC) 10 MG tablet Take 10 mg by mouth daily.   Yes Historical Provider, MD  aspirin EC 81 MG tablet Take 81 mg by mouth daily.   Yes Historical Provider, MD  atorvastatin  (LIPITOR) 40 MG tablet Take 40 mg by mouth at bedtime.    Yes Historical Provider, MD  carvedilol (COREG) 25 MG tablet Take 25 mg by mouth 2 (two) times daily.   Yes Historical Provider, MD  cetirizine (ZYRTEC) 10 MG tablet Take 10 mg by mouth daily.   Yes Historical Provider, MD  clopidogrel (PLAVIX) 75 MG tablet Take 75 mg by mouth daily.   Yes Historical Provider, MD  docusate sodium (COLACE) 100 MG capsule Take 1 capsule (100 mg total) by mouth 2 (two) times daily. 04/03/15  Yes Theodoro Grist, MD  hydrALAZINE (APRESOLINE) 100 MG tablet Take 1 tablet (100 mg total) by mouth 3 (three) times daily. 03/09/15  Yes Henreitta Leber, MD  hydrocortisone cream 1 % Apply 1 application topically 2 (two) times daily.   Yes Historical Provider, MD  insulin aspart (NOVOLOG) 100 UNIT/ML injection Inject 2-10 Units into the skin 4 (four) times daily -  with meals and at bedtime. Per sliding scale   Yes Historical Provider, MD  insulin detemir (LEVEMIR) 100 UNIT/ML injection Inject 8 Units into the skin at bedtime.   Yes Historical Provider, MD  Liniments [facility] ARTHRITIS PAIN RELIEF) PADS Apply 1 each topically 3 (three) times daily. (every shift)   Yes Historical Provider, MD  lipase/protease/amylase (CREON) 12000 UNITS CPEP capsule Take 24,000-48,000 Units  by mouth 5 (five) times daily. 4 capsules 3 times daily with meals and 2 capsules 2 times daily with snacks   Yes Historical Provider, MD  metoCLOPramide (REGLAN) 5 MG tablet Take 1 tablet (5 mg total) by mouth 4 (four) times daily -  before meals and at bedtime. 02/24/15  Yes Srikar Sudini, MD  Omega-3 Fatty Acids (FISH OIL) 1000 MG CAPS Take 1 capsule by mouth 2 (two) times daily.   Yes Historical Provider, MD  pantoprazole (PROTONIX) 40 MG tablet Take 1 tablet (40 mg total) by mouth 2 (two) times daily before a meal. 02/24/15  Yes Srikar Sudini, MD  polyethylene glycol (MIRALAX / GLYCOLAX) packet Take 17 g by mouth daily. 04/10/15  Yes Earleen Newport, MD   promethazine (PHENERGAN) 12.5 MG tablet Take 12.5 mg by mouth every 6 (six) hours as needed for nausea or vomiting.   Yes Historical Provider, MD  ranitidine (ZANTAC) 150 MG tablet Take 150 mg by mouth 2 (two) times daily.   Yes Historical Provider, MD  ranolazine (RANEXA) 500 MG 12 hr tablet Take 1,000 mg by mouth 2 (two) times daily.   Yes Historical Provider, MD  senna (SENOKOT) 8.6 MG TABS tablet Take 1 tablet (8.6 mg total) by mouth daily as needed for mild constipation. Patient taking differently: Take 1 tablet by mouth 2 (two) times daily.  04/03/15  Yes Theodoro Grist, MD  sucralfate (CARAFATE) 1 G tablet Take 1 tablet (1 g total) by mouth 4 (four) times daily -  with meals and at bedtime. 02/24/15  Yes Srikar Sudini, MD  Vitamin D, Cholecalciferol, 400 UNITS TABS Take 400 Units by mouth 2 (two) times daily.    Yes Historical Provider, MD  acetaminophen (TYLENOL) 325 MG tablet Take 2 tablets (650 mg total) by mouth every 6 (six) hours as needed. 04/16/15   Carrie Mew, MD  albuterol (PROVENTIL HFA;VENTOLIN HFA) 108 (90 BASE) MCG/ACT inhaler Inhale 4-6 puffs by mouth every 4 hours as needed for wheezing, cough, and/or shortness of breath Patient taking differently: Inhale 4 puffs into the lungs every 4 (four) hours as needed for wheezing or shortness of breath.  02/17/15   Hinda Kehr, MD  ALPRAZolam Duanne Moron) 0.25 MG tablet Take 1 tablet (0.25 mg total) by mouth 2 (two) times daily as needed for anxiety. Patient taking differently: Take 0.25 mg by mouth every 12 (twelve) hours as needed for anxiety.  03/14/15   Gonzella Lex, MD  diazepam (VALIUM) 5 MG tablet Take 1 tablet (5 mg total) by mouth every 8 (eight) hours as needed for muscle spasms. 04/10/15   Earleen Newport, MD  HYDROcodone-acetaminophen (NORCO/VICODIN) 5-325 MG per tablet Take 1 tablet by mouth every 6 (six) hours as needed for moderate pain. 02/08/15   Earleen Newport, MD  nitroGLYCERIN (NITROSTAT) 0.4 MG SL tablet Place  0.4 mg under the tongue every 5 (five) minutes x 3 doses as needed for chest pain.     Historical Provider, MD  ondansetron (ZOFRAN) 4 MG tablet Take 1 tablet (4 mg total) by mouth daily as needed for nausea or vomiting. 02/08/15   Earleen Newport, MD   Allergies  Allergen Reactions  . Ace Inhibitors Swelling  . Ativan [Lorazepam] Other (See Comments)    Hallucinations, headaches  . Codeine Nausea And Vomiting  . Compazine [Prochlorperazine Edisylate] Nausea And Vomiting  . Losartan Other (See Comments)  . Phenergan [Promethazine Hcl] Other (See Comments)    Hallucinations  . Zofran Alvis Lemmings  Hcl] Other (See Comments)    Dementia sx  . Oxycodone Anxiety  . Tape Rash     FAMILY HISTORY   Family History  Problem Relation Age of Onset  . Kidney disease Mother   . Diabetes Mother       SOCIAL HISTORY    reports that she has been smoking Cigarettes.  She has been smoking about 0.50 packs per day. She has never used smokeless tobacco. She reports that she does not drink alcohol or use illicit drugs.  Review of Systems  Unable to perform ROS: critical illness      VITAL SIGNS    Temp:  [86.4 F (30.2 C)-87.4 F (30.8 C)] 87.4 F (30.8 C) (08/02 0900) Pulse Rate:  [38-47] 45 (08/02 0900) Resp:  [14-20] 16 (08/02 0900) BP: (104-136)/(51-91) 121/60 mmHg (08/02 0900) SpO2:  [100 %] 100 % (08/02 0900) FiO2 (%):  [30 %-50 %] 30 % (08/02 0904) HEMODYNAMICS:   VENTILATOR SETTINGS: Vent Mode:  [-] PRVC FiO2 (%):  [30 %-50 %] 30 % Set Rate:  [16 bmp] 16 bmp Vt Set:  [500 mL] 500 mL PEEP:  [5 cmH20] 5 cmH20 INTAKE / OUTPUT:  Intake/Output Summary (Last 24 hours) at 04/24/15 0949 Last data filed at 04/24/15 0824  Gross per 24 hour  Intake      0 ml  Output     35 ml  Net    -35 ml       PHYSICAL EXAM   Physical Exam  Constitutional: No distress.  HENT:  Head: Normocephalic and atraumatic.  Eyes: Pupils are equal, round, and reactive to light. No  scleral icterus.  Neck: Normal range of motion. Neck supple.  Cardiovascular: Normal rate and regular rhythm.   No murmur heard. Pulmonary/Chest: No respiratory distress. She has no wheezes. She has rales.  resp distress  Abdominal: Soft. She exhibits no distension. There is no tenderness.  Musculoskeletal: She exhibits no edema.  Neurological: She displays normal reflexes. Coordination normal.  gcs<8T  Skin: Skin is warm. No rash noted. She is diaphoretic.       LABS   LABS:  CBC  Recent Labs Lab 04/24/15 0528  WBC 3.8  HGB 7.9*  HCT 24.7*  PLT 114*   Coag's No results for input(s): APTT, INR in the last 168 hours. BMET  Recent Labs Lab 04/24/15 0528  NA 137  K 3.4*  CL 96*  CO2 36*  BUN 9  CREATININE 2.71*  GLUCOSE 76   Electrolytes  Recent Labs Lab 04/24/15 0528  CALCIUM 8.3*   Sepsis Markers  Recent Labs Lab 04/24/15 0753  LATICACIDVEN 2.6*   ABG  Recent Labs Lab 04/24/15 0551  PHART 7.62*  PCO2ART 39  PO2ART 267*   Liver Enzymes  Recent Labs Lab 04/24/15 0528  AST 19  ALT 9*  ALKPHOS 101  BILITOT 0.5  ALBUMIN 2.3*   Cardiac Enzymes  Recent Labs Lab 04/24/15 0528  TROPONINI <0.03   Glucose  Recent Labs Lab 04/24/15 0533 04/24/15 0627 04/24/15 0658 04/24/15 0752 04/24/15 0853  GLUCAP 120* 48* 101* 87 76     No results found for this or any previous visit (from the past 240 hour(s)).   Current facility-administered medications:  .  0.9 %  sodium chloride infusion, 250 mL, Intravenous, PRN, Demetrios Loll, MD .  acetaminophen (TYLENOL) tablet 650 mg, 650 mg, Oral, Q6H PRN **OR** acetaminophen (TYLENOL) suppository 650 mg, 650 mg, Rectal, Q6H PRN, Demetrios Loll, MD .  dextrose 10 % infusion, , Intravenous, Continuous, Gregor Hams, MD, Last Rate: 100 mL/hr at 04/24/15 0807 .  dextrose 5 %-0.9 % sodium chloride infusion, , Intravenous, Continuous, Gregor Hams, MD, Stopped at 04/24/15 0730 .  fentaNYL 2556mg in  NS 2579m(1076mml) infusion-PREMIX, 10 mcg/hr, Intravenous, Continuous, Sarath Kolluru, MD .  heparin injection 5,000 Units, 5,000 Units, Subcutaneous, 3 times per day, QinDemetrios LollD .  sodium chloride 0.9 % injection 3 mL, 3 mL, Intravenous, Q12H, QinDemetrios LollD, 3 mL at 04/24/15 0943 .  sodium chloride 0.9 % injection 3 mL, 3 mL, Intravenous, PRN, QinDemetrios LollD  IMAGING    Ct Head Wo Contrast  04/24/2015   CLINICAL DATA:  Hypoglycemic.  Unresponsive.  EXAM: CT HEAD WITHOUT CONTRAST  TECHNIQUE: Contiguous axial images were obtained from the base of the skull through the vertex without intravenous contrast.  COMPARISON:  04/16/2015  FINDINGS: There is no intracranial hemorrhage, mass or evidence of acute infarction. There is no extra-axial fluid collection. Gray matter and white matter appear normal. Cerebral volume is normal for age. Brainstem and posterior fossa are unremarkable. The CSF spaces appear normal.  The bony structures are intact. There is a right maxillary sinus air-fluid level and right frontal sinus air-fluid level. The other paranasal sinuses are clear.  IMPRESSION: 1. Normal brain 2. Right maxillary and right frontal sinus air-fluid levels.   Electronically Signed   By: DanAndreas NewportD.   On: 04/24/2015 06:39   Dg Chest Portable 1 View  04/24/2015   CLINICAL DATA:  Hypoglycemia, unresponsiveness. Endotracheal tube placement. History of diabetes and dialysis, hypertension, asthma, CHF, COPD.  EXAM: PORTABLE CHEST - 1 VIEW  COMPARISON:  Chest radiograph April 01, 2015  FINDINGS: Interval intubation, distal tip projects 4.7 cm above the carina.  Cardiac silhouette appears mildly enlarged, similar to prior examination. Central pulmonary vascular congestion and diffuse interstitial prominence. Small bilateral pleural effusions. Elevated LEFT hemidiaphragm with atelectasis. Apical pleural capping, no pneumothorax. LEFT subclavian vascular stent. Osseous structure nonsuspicious.   IMPRESSION: Endotracheal tube tip projects 4.7 cm above the carina.  Mild cardiomegaly, interstitial pulmonary edema with small pleural effusions. Elevated LEFT hemidiaphragm with atelectasis.   Electronically Signed   By: CouElon AlasD.   On: 04/24/2015 06:21      Indwelling Urinary Catheter continued, requirement due to   Reason to continue Indwelling Urinary Catheter for strict Intake/Output monitoring for hemodynamic instability         Ventilator continued, requirement due to, resp failure    Ventilator Sedation RASS 0 to -2      ASSESSMENT/PLAN   [age] AAM admitted to ICU for acute resp failure from acute encephalopathy from acute hypoglycemia and hypothermia intubated for inability to protect airway   PULMONARY -Respiratory Failure -continue Full MV support -continue Bronchodilator Therapy -Wean Fio2 and PEEP as tolerated -will perform SAT/SBt when respiratory parameters are met   CARDIOVASCULAR Needs ICU monitoting  RENAL ESRD on HD-follow up nephrology recs -cont Foley for now  GASTROINTESTINAL NPO- GI prophylaxis  HEMATOLOGIC Follow h/h   ENDOCRINE Follow FSBS continue d10 infusion  NEUROLOGIC - intubated and sedated - minimal sedation to achieve a RASS goal: -1 -plan for trial of extubation    I have personally obtained a history, examined the patient, evaluated laboratory and independently reviewed  imaging results, formulated the assessment and plan and placed orders.  The Patient requires high complexity decision making for assessment and support, frequent evaluation and titration  of therapies, application of advanced monitoring technologies and extensive interpretation of multiple databases. Critical Care Time devoted to patient care services described in this note is 45 minutes.   Overall, patient is critically ill, prognosis is guarded. Patient at high risk for cardiac arrest and death.    Corrin Parker, M.D.  [facility]  Pulmonary & Critical Care Medicine  Medical Director [place] Director [facility] Cardio-Pulmonary Department

## 2015-04-24 NOTE — Progress Notes (Signed)
   04/24/15 1158  Clinical Encounter Type  Visited With Patient and family together  Visit Type Follow-up  Consult/Referral To Chaplain  Spiritual Encounters  Spiritual Needs Prayer;Emotional  Stress Factors  Patient Stress Factors Health changes  Family Stress Factors Health changes  Chaplain provided compassion, care and prayer to patient at her request. Offered spiritual support and listened. Chaplain Larance Ratledge A. Artice Holohan Ext. [phone]

## 2015-04-25 DIAGNOSIS — E162 Hypoglycemia, unspecified: Secondary | ICD-10-CM

## 2015-04-25 DIAGNOSIS — H109 Unspecified conjunctivitis: Secondary | ICD-10-CM

## 2015-04-25 DIAGNOSIS — J9600 Acute respiratory failure, unspecified whether with hypoxia or hypercapnia: Secondary | ICD-10-CM

## 2015-04-25 LAB — BASIC METABOLIC PANEL
Anion gap: 8 (ref 5–15)
BUN: 13 mg/dL (ref 6–20)
CO2: 31 mmol/L (ref 22–32)
Calcium: 8.5 mg/dL — ABNORMAL LOW (ref 8.9–10.3)
Chloride: 90 mmol/L — ABNORMAL LOW (ref 101–111)
Creatinine, Ser: 3.58 mg/dL — ABNORMAL HIGH (ref 0.44–1.00)
GFR calc Af Amer: 15 mL/min — ABNORMAL LOW (ref >60)
GFR calc non Af Amer: 13 mL/min — ABNORMAL LOW (ref >60)
Glucose, Bld: 131 mg/dL — ABNORMAL HIGH (ref 65–99)
Potassium: 3.6 mmol/L (ref 3.5–5.1)
Sodium: 129 mmol/L — ABNORMAL LOW (ref 135–145)

## 2015-04-25 LAB — GLUCOSE, CAPILLARY
Glucose-Capillary: 84 mg/dL (ref 65–99)
Glucose-Capillary: 73 mg/dL (ref 65–99)

## 2015-04-25 LAB — CBC
HCT: 24.4 % — ABNORMAL LOW (ref 35.0–47.0)
Hemoglobin: 8 g/dL — ABNORMAL LOW (ref 12.0–16.0)
MCH: 28 pg (ref 26.0–34.0)
MCHC: 32.8 g/dL (ref 32.0–36.0)
MCV: 85.2 fL (ref 80.0–100.0)
Platelets: 131 10*3/uL — ABNORMAL LOW (ref 150–440)
RBC: 2.86 MIL/uL — ABNORMAL LOW (ref 3.80–5.20)
RDW: 15.8 % — ABNORMAL HIGH (ref 11.5–14.5)
WBC: 6 10*3/uL (ref 3.6–11.0)

## 2015-04-25 MED ORDER — INSULIN ASPART 100 UNIT/ML ~~LOC~~ SOLN: 0.0000 [IU] | Freq: Every day | SUBCUTANEOUS | Status: DC

## 2015-04-25 MED ORDER — PANCRELIPASE (LIP-PROT-AMYL) 12000-38000 UNITS PO CPEP
24000.0000 [IU] | ORAL_CAPSULE | ORAL | Status: DC
  Filled 2015-04-25 (×5): qty 2

## 2015-04-25 MED ORDER — METOCLOPRAMIDE HCL 5 MG PO TABS
5.0000 mg | ORAL_TABLET | Freq: Three times a day (TID) | ORAL | Status: DC
  Administered 2015-04-25 – 2015-04-26 (×4): 5 mg via ORAL
  Filled 2015-04-25 (×4): qty 1

## 2015-04-25 MED ORDER — ENSURE ENLIVE PO LIQD
237.0000 mL | Freq: Two times a day (BID) | ORAL | Status: DC
  Administered 2015-04-25 – 2015-04-26 (×2): 237 mL via ORAL

## 2015-04-25 MED ORDER — PANCRELIPASE (LIP-PROT-AMYL) 12000-38000 UNITS PO CPEP
48000.0000 [IU] | ORAL_CAPSULE | Freq: Three times a day (TID) | ORAL | Status: DC
  Administered 2015-04-25 – 2015-04-26 (×3): 48000 [IU] via ORAL
  Filled 2015-04-25 (×6): qty 4

## 2015-04-25 MED ORDER — ATORVASTATIN CALCIUM 20 MG PO TABS
40.0000 mg | ORAL_TABLET | Freq: Every day | ORAL | Status: DC
  Administered 2015-04-25: 40 mg via ORAL
  Filled 2015-04-25: qty 2

## 2015-04-25 MED ORDER — HYDRALAZINE HCL 50 MG PO TABS
100.0000 mg | ORAL_TABLET | Freq: Three times a day (TID) | ORAL | Status: DC
  Administered 2015-04-25 – 2015-04-26 (×4): 100 mg via ORAL
  Filled 2015-04-25 (×4): qty 2

## 2015-04-25 MED ORDER — RANOLAZINE ER 500 MG PO TB12
500.0000 mg | ORAL_TABLET | Freq: Two times a day (BID) | ORAL | Status: DC
  Administered 2015-04-25 – 2015-04-26 (×3): 500 mg via ORAL
  Filled 2015-04-25 (×3): qty 1

## 2015-04-25 MED ORDER — CARVEDILOL 25 MG PO TABS
25.0000 mg | ORAL_TABLET | Freq: Two times a day (BID) | ORAL | Status: DC
  Administered 2015-04-25 – 2015-04-26 (×2): 25 mg via ORAL
  Filled 2015-04-25 (×2): qty 1

## 2015-04-25 MED ORDER — EPOETIN ALFA 10000 UNIT/ML IJ SOLN
10000.0000 [IU] | Freq: Once | INTRAMUSCULAR | Status: AC
Start: 2015-04-25 — End: 2015-04-25
  Administered 2015-04-25: 10000 [IU] via INTRAVENOUS

## 2015-04-25 MED ORDER — INSULIN ASPART 100 UNIT/ML ~~LOC~~ SOLN: 0.0000 [IU] | Freq: Three times a day (TID) | SUBCUTANEOUS | Status: DC

## 2015-04-25 MED ORDER — CLOPIDOGREL BISULFATE 75 MG PO TABS
75.0000 mg | ORAL_TABLET | Freq: Every day | ORAL | Status: DC
  Administered 2015-04-25 – 2015-04-26 (×2): 75 mg via ORAL
  Filled 2015-04-25 (×3): qty 1

## 2015-04-25 MED ORDER — SEVELAMER CARBONATE 800 MG PO TABS
2400.0000 mg | ORAL_TABLET | Freq: Three times a day (TID) | ORAL | Status: DC
  Administered 2015-04-25: 2400 mg via ORAL
  Filled 2015-04-25 (×3): qty 3

## 2015-04-25 MED ORDER — SUCRALFATE 1 G PO TABS
1.0000 g | ORAL_TABLET | Freq: Three times a day (TID) | ORAL | Status: DC
  Administered 2015-04-25 – 2015-04-26 (×3): 1 g via ORAL
  Filled 2015-04-25 (×4): qty 1

## 2015-04-25 MED ORDER — AMLODIPINE BESYLATE 10 MG PO TABS
10.0000 mg | ORAL_TABLET | Freq: Every day | ORAL | Status: DC
  Administered 2015-04-25 – 2015-04-26 (×2): 10 mg via ORAL
  Filled 2015-04-25 (×2): qty 1

## 2015-04-25 MED ORDER — PROCHLORPERAZINE MALEATE 5 MG PO TABS
5.0000 mg | ORAL_TABLET | Freq: Four times a day (QID) | ORAL | Status: DC | PRN
  Administered 2015-04-25 – 2015-04-26 (×3): 5 mg via ORAL
  Filled 2015-04-25 (×5): qty 1

## 2015-04-25 MED ORDER — ASPIRIN EC 81 MG PO TBEC
81.0000 mg | DELAYED_RELEASE_TABLET | Freq: Every day | ORAL | Status: DC
  Administered 2015-04-25 – 2015-04-26 (×2): 81 mg via ORAL
  Filled 2015-04-25 (×2): qty 1

## 2015-04-25 MED ORDER — CIPROFLOXACIN HCL 0.3 % OP SOLN
1.0000 [drp] | OPHTHALMIC | Status: DC
  Administered 2015-04-25 – 2015-04-26 (×6): 1 [drp] via OPHTHALMIC
  Filled 2015-04-25: qty 2.5

## 2015-04-25 MED ORDER — PANTOPRAZOLE SODIUM 40 MG PO TBEC
40.0000 mg | DELAYED_RELEASE_TABLET | Freq: Two times a day (BID) | ORAL | Status: DC
  Administered 2015-04-25 – 2015-04-26 (×2): 40 mg via ORAL
  Filled 2015-04-25 (×2): qty 1

## 2015-04-25 MED ORDER — CHOLECALCIFEROL 10 MCG (400 UNIT) PO TABS
400.0000 [IU] | ORAL_TABLET | Freq: Two times a day (BID) | ORAL | Status: DC
  Administered 2015-04-25 – 2015-04-26 (×3): 400 [IU] via ORAL
  Filled 2015-04-25 (×4): qty 1

## 2015-04-25 NOTE — Progress Notes (Signed)
Subjective:   Seen and examined on hemodialysis. Tolerating procedure well.  Extubated yesterday.  Complains of right eye pain.   Objective:  Vital signs in last 24 hours:  Temp:  [87.4 F (30.8 C)-99.3 F (37.4 C)] 97.8 F (36.6 C) (08/03 0700) Pulse Rate:  [45-81] 73 (08/03 0700) Resp:  [8-21] 14 (08/03 0700) BP: (110-162)/(53-70) 149/61 mmHg (08/03 0700) SpO2:  [85 %-100 %] 100 % (08/03 0700) FiO2 (%):  [30 %] 30 % (08/02 0904)  Weight change:  There were no vitals filed for this visit.  Intake/Output: I/O last 3 completed shifts: In: 2291.3 [I.V.:2291.3] Out: 35 [Urine:35]     Physical Exam: General: NAD  HEENT Intubated, eyes open  Neck supple  Pulm/lungs Clear bilaterally  CVS/Heart bradycardia  Abdomen:  Soft, obese  Extremities: Trace edema  Neurologic: sedated  Skin: No acute rashes  Access: AVG       Basic Metabolic Panel:  Recent Labs Lab 04/24/15 0528  NA 137  K 3.4*  CL 96*  CO2 36*  GLUCOSE 76  BUN 9  CREATININE 2.71*  CALCIUM 8.3*     CBC:  Recent Labs Lab 04/24/15 0528  WBC 3.8  HGB 7.9*  HCT 24.7*  MCV 86.7  PLT 114*      Microbiology: Results for orders placed or performed during the hospital encounter of 04/24/15  MRSA PCR Screening     Status: None   Collection Time: 04/24/15  9:00 AM  Result Value Ref Range Status   MRSA by PCR NEGATIVE NEGATIVE Final    Comment:        The GeneXpert MRSA Assay (FDA approved for NASAL specimens only), is one component of a comprehensive MRSA colonization surveillance program. It is not intended to diagnose MRSA infection nor to guide or monitor treatment for MRSA infections.     Coagulation Studies: No results for input(s): LABPROT, INR in the last 72 hours.  Urinalysis:  Recent Labs  04/24/15 0753  COLORURINE YELLOW*  LABSPEC 1.009  PHURINE 9.0*  GLUCOSEU NEGATIVE  HGBUR NEGATIVE  BILIRUBINUR NEGATIVE  KETONESUR NEGATIVE  PROTEINUR 100*  NITRITE NEGATIVE   LEUKOCYTESUR NEGATIVE      Imaging: Ct Head Wo Contrast  04/24/2015   CLINICAL DATA:  Hypoglycemic.  Unresponsive.  EXAM: CT HEAD WITHOUT CONTRAST  TECHNIQUE: Contiguous axial images were obtained from the base of the skull through the vertex without intravenous contrast.  COMPARISON:  04/16/2015  FINDINGS: There is no intracranial hemorrhage, mass or evidence of acute infarction. There is no extra-axial fluid collection. Gray matter and white matter appear normal. Cerebral volume is normal for age. Brainstem and posterior fossa are unremarkable. The CSF spaces appear normal.  The bony structures are intact. There is a right maxillary sinus air-fluid level and right frontal sinus air-fluid level. The other paranasal sinuses are clear.  IMPRESSION: 1. Normal brain 2. Right maxillary and right frontal sinus air-fluid levels.   Electronically Signed   By: Andreas Newport M.D.   On: 04/24/2015 06:39   Dg Chest Portable 1 View  04/24/2015   CLINICAL DATA:  Hypoglycemia, unresponsiveness. Endotracheal tube placement. History of diabetes and dialysis, hypertension, asthma, CHF, COPD.  EXAM: PORTABLE CHEST - 1 VIEW  COMPARISON:  Chest radiograph April 01, 2015  FINDINGS: Interval intubation, distal tip projects 4.7 cm above the carina.  Cardiac silhouette appears mildly enlarged, similar to prior examination. Central pulmonary vascular congestion and diffuse interstitial prominence. Small bilateral pleural effusions. Elevated LEFT hemidiaphragm with atelectasis.  Apical pleural capping, no pneumothorax. LEFT subclavian vascular stent. Osseous structure nonsuspicious.  IMPRESSION: Endotracheal tube tip projects 4.7 cm above the carina.  Mild cardiomegaly, interstitial pulmonary edema with small pleural effusions. Elevated LEFT hemidiaphragm with atelectasis.   Electronically Signed   By: Elon Alas M.D.   On: 04/24/2015 06:21   Dg Knee Left Port  04/24/2015   CLINICAL DATA:  Pain for 1 day.  No history of  trauma  EXAM: PORTABLE LEFT KNEE - 1-2 VIEW  COMPARISON:  None.  FINDINGS: Frontal and lateral views were obtained. No fracture or dislocation. No joint effusion. There is mild narrowing of the medial and lateral compartments. No erosive change.  IMPRESSION: Osteoarthritic change.  No fracture or effusion.   Electronically Signed   By: Lowella Grip III M.D.   On: 04/24/2015 16:30     Medications:   . dextrose 100 mL/hr at 04/25/15 0241   . ciprofloxacin  1 drop Right Eye Q4H while awake  . epoetin (EPOGEN/PROCRIT) injection  10,000 Units Intravenous Once  . heparin  5,000 Units Subcutaneous 3 times per day  . simethicone  80 mg Oral QID  . sodium chloride  3 mL Intravenous Q12H   sodium chloride, acetaminophen **OR** acetaminophen, sodium chloride  Assessment/ Plan:  54 y.o. black female with ESRD, DM (30 yrs with retinopathy, gastroparesis, neuropathy), OA, IBS, hyperlipidemia, h/o GI bleed, SHPTH, AOCD, diastolic heart failure ,hypertension, hyperlipidemia, anemia, CAD  Admitted on 04/24/2015 for hypogycemia, hypothermia, and bradycardia.   [facility] MWF 1st shift [place]   1. ESRD: hemodialysis today. Tolerating treatment well.  Continue MWF schedule.   2. Anemia of chronic kidney disease: on micera as an outpatient. Hemoglobin low at 7.9 - epo with HD treatment.   3. Secondary Hyperparathyroidism: on 3 tabs renvela as outpatient. Phos 2.8 on 7/21 as outpatient. PTH 171.  - restart renvela  4 Hypotension: improved.  - holding home hypertensive agents.   5. Diabetes mellitus type II: insulin dependent. With chronic kidney disease. Will need better control to prevent further hypoglycemic events.     LOS: [place], [place] 8/3/20168:36 AM

## 2015-04-25 NOTE — Clinical Social Work Note (Signed)
Clinical Social Work Assessment  Patient Details  Name: [name] MRN: [id number] Date of Birth: 06-08-1961  Date of referral:  04/25/15               Reason for consult:   (from SNF / [facility])                Permission sought to share information with:  Facility Sport and exercise psychologist, Family Supports Permission granted to share information::  Yes, Verbal Permission Granted  Name::      (Son [name] [phone])  Agency::     Relationship::     Contact Information:   Oceanographer)  Housing/Transportation Living arrangements for the past 2 months:  [place] of Information:  Patient Patient Interpreter Needed:  None Criminal Activity/Legal Involvement Pertinent to Current Situation/Hospitalization:  No - Comment as needed Significant Relationships:  Adult Children Lives with:  Facility Resident, Adult Children Do you feel safe going back to the place where you live?  Yes Need for family participation in patient care:  Yes (Comment) (per patient needs son to call facility to discuss her concerns)  Care giving concerns:  Patient expressed concerns with current SNF, however willing to work out concerns with facility.    Social Worker assessment / plan:  CSW in for assessment of patient for ongoing and discharge needs. patient is on dialysis in room however is engaged in conversation with CSW.  States she is currently at [facility] for rehab and has been there since June 2016.  Prior to [facility] patient lived at home with her daughter and grandchildren. Her primary contact person is her son [name].   Patient receives Dialysis MWF with [facility]. She ambulates with a walker and uses a wheel chair at the facility.   Patient states not happy with [facility], however after talking with CSW she has agreed to talk to [employer] to address her concerns.  Plan is for her to return to [facility].  Call to [place]  to verify if patient is able to return, left message for [place].  CSW will complete FL2 in anticipation of patient returning to facility at discharge.   Employment status:    Forensic scientist:  Medicaid In [place], [place] PT Recommendations:  Not assessed at this time Information / Referral to community resources:  [place]  Patient/Family's Response to care:  Patient in agreement to return to facility after her concerns have been addressed.   Patient/Family's Understanding of and Emotional Response to Diagnosis, Current Treatment, and Prognosis:  Patient in agreement to return to facility when medically stable.    Emotional Assessment Appearance:  Appears older than stated age Attitude/Demeanor/Rapport:   (appropriate) Affect (typically observed):  Appropriate, Calm, Pleasant, Stable Orientation:  Oriented to Self, Oriented to Place, Oriented to  Time, Oriented to Situation Alcohol / Substance use:    Psych involvement (Current and /or in the community):     Discharge Needs  Concerns to be addressed:  No discharge needs identified Readmission within the last 30 days:  Yes Current discharge risk:  Chronically ill, Dependent with Mobility Barriers to Discharge:  No Barriers Identified   Maurine Cane, LCSW 04/25/2015, 12:34 PM [place] Lanius. Latanya Presser, MSW Clinical Social Work Department Emergency Room [phone] 12:38 PM

## 2015-04-25 NOTE — Care Management Note (Signed)
Patient is active at [facility] MWF.  I will send updated notes to clinic at discharge. [name] Liaison  [phone]

## 2015-04-25 NOTE — Progress Notes (Signed)
Xanax did help the pts anxiety, she slept throughout the night, no CO pain/distress.  Blood glucose and VS have been stable overnight.

## 2015-04-25 NOTE — Progress Notes (Signed)
R eye unable to open due to dried purulent secretions. Otherwise no new complaints. Intubated and extubated yesterday. Intubation due to severe hypoglycemia. No respiratory distress  R eye "glued" shut HEENT otherwise WNL No JVD Chest clear Reg, no M Abd soft, +BS No edema  BMET    Component Value Date/Time   NA 137 04/24/2015 0528   NA 135* 09/15/2014 0948   K 3.4* 04/24/2015 0528   K 4.8 09/15/2014 0948   CL 96* 04/24/2015 0528   CL 99 09/15/2014 0948   CO2 36* 04/24/2015 0528   CO2 26 09/15/2014 0948   GLUCOSE 76 04/24/2015 0528   GLUCOSE 118* 09/15/2014 0948   BUN 9 04/24/2015 0528   BUN 19* 09/15/2014 0948   CREATININE 2.71* 04/24/2015 0528   CREATININE 6.79* 09/15/2014 0948   CALCIUM 8.3* 04/24/2015 0528   CALCIUM 8.3* 09/15/2014 0948   GFRNONAA 19* 04/24/2015 0528   GFRNONAA 6* 05/31/2014 0432   GFRAA 22* 04/24/2015 0528   GFRAA 7* 05/31/2014 0432    CBC    Component Value Date/Time   WBC 3.8 04/24/2015 0528   WBC 10.0 09/15/2014 0948   RBC 2.85* 04/24/2015 0528   RBC 3.51* 09/15/2014 0948   HGB 7.9* 04/24/2015 0528   HGB 10.5* 09/15/2014 0948   HCT 24.7* 04/24/2015 0528   HCT 34.2* 09/15/2014 0948   PLT 114* 04/24/2015 0528   PLT 203 09/15/2014 0948   MCV 86.7 04/24/2015 0528   MCV 97 09/15/2014 0948   MCH 27.9 04/24/2015 0528   MCH 29.9 09/15/2014 0948   MCHC 32.2 04/24/2015 0528   MCHC 30.8* 09/15/2014 0948   RDW 16.5* 04/24/2015 0528   RDW 16.5* 09/15/2014 0948   LYMPHSABS 2.7 04/10/2015 0850   LYMPHSABS 2.5 05/31/2014 0432   MONOABS 0.4 04/10/2015 0850   MONOABS 0.5 05/31/2014 0432   EOSABS 0.2 04/10/2015 0850   EOSABS 0.2 05/31/2014 0432   BASOSABS 0.1 04/10/2015 0850   BASOSABS 0.0 05/31/2014 0432    CXR: No new film   IMPRESSION: Brief VDRF due to severe hypoglycemia and consequent AMS Hypoglycemia - resolved ESRD - HD planned shortly Probable R eye conjunctivitis  PLAN/REC: OK to transfer out of ICU after HD this AM Warm  compress to R eye Topical abx to R eye X 5 days Will leave furtehr mgmt of DM 2 to primary team  PCCM will sign off. Please call if we can be of further assistance  Merton Border, MD ; [facility] [phone].  After 5:30 PM or weekends, call [phone]

## 2015-04-25 NOTE — Progress Notes (Signed)
   04/25/15 0845  Clinical Encounter Type  Visited With Patient;Health care provider  Visit Type Follow-up  Consult/Referral To Chaplain  Spiritual Encounters  Spiritual Needs Emotional  Stress Factors  Patient Stress Factors None identified  Chaplain rounded in the unit and offered a compassionate presence and support as applicable. Chaplain Haskel Dewalt A. Welby Montminy Ext. [phone]

## 2015-04-25 NOTE — Progress Notes (Addendum)
Initial Nutrition Assessment     INTERVENTION:  Meals and Snacks: Cater to patient preferences; pt would benefit from smaller more frequent meals due to poor intake and episodes of hypoglycmeia. Pt may benefit from downgrading to Dysphagia III diet due to difficulty chewing due to sore gums/dentures Medical Food Supplement Therapy: discussed nutritional supplements with pt; reports she does not like Nepro; as electrolytes wdl, recommend pt try Ensure Enlive, will send BID between meals  NUTRITION DIAGNOSIS:   Inadequate oral intake related to altered GI function, social / environmental circumstances as evidenced by per patient/family report.  GOAL:   Patient will meet greater than or equal to 90% of their needs   MONITOR:    (Energy Intake, Anthropometrics, Electrolyte/Renal Profile, Glucose Profile, Digestive System)  REASON FOR ASSESSMENT:    (Renal Diet)    ASSESSMENT:   Pt admitted with unresponsiveness, acute metabolic encephalopathy with hypoglycemia; pt required intubation but extubated yesterday, receiving dialysis on visit today  Past Medical History  Diagnosis Date  . Asthma   . Collagen vascular disease   . Diabetes mellitus without complication   . Hypertension   . Coronary artery disease     a. cath 2013: stenting to RCA (report not available); b. cath 2014: LM nl, pLAD 40%, mLAD nl, ost LCx 40%, mid LCx nl, pRCA 30% @ site of prior stent, mRCA 50%  . Diabetic neuropathy   . ESRD (end stage renal disease) on dialysis     M-W-F  . GERD (gastroesophageal reflux disease)   . Hx of pancreatitis 2015  . Myocardial infarction   . Pneumonia   . Mitral regurgitation     a. echo 10/2013: EF 62%, noWMA, mildly dilated LA, mild to mod MR/TR, GR1DD  . COPD (chronic obstructive pulmonary disease)   . Acute diastolic CHF (congestive heart failure) 04/05/2015    Diet Order:  Diet renal/carb modified with fluid restriction Diet-HS Snack?: Nothing; Room service  appropriate?: Yes; Fluid consistency:: Thin   Energy Intake: pt did not eat breakfast this AM as pt receiving dialysis; unsure about intake yesterday  Food and Nutrition related history: pt reports po intake has been down prior to admission as pt does not like the food at [facility]; reports the meals are not good. Pt reports they give her a protein supplement similar to Prostat at Dialysis but does not take other supplements  Nutrition Focused Physical Exam:  Unable to complete Nutrition-Focused physical exam at this time. Pt receiving dialysis in room on visit, assessment not performed  Digestive System: +nausea at times, no vomiting at present; pt wears dentures, reports sore gums, reports she needs softer foods  Skin:   (stage II pressure ulcer on coccyx)  Last BM:  8/2   Electrolyte and Renal Profile:  Recent Labs Lab 04/24/15 0528 04/25/15 0901  BUN 9 13  CREATININE 2.71* 3.58*  NA 137 129*  K 3.4* 3.6   Glucose Profile:   Recent Labs  04/24/15 1043 04/24/15 1627 04/24/15 2246  GLUCAP 77 86 111*   Protein Profile:   Recent Labs Lab 04/24/15 0528  ALBUMIN 2.3*   Meds: reglan, creon, renvela  Height:   Ht Readings from Last 1 Encounters:  04/25/15 5' 5.5" (1.664 m)    Weight: pt reports weight has been relatively stable   Wt Readings from Last 1 Encounters:  04/25/15 189 lb 2.5 oz (85.8 kg)   Filed Weights   04/25/15 0720 04/25/15 0853 04/25/15 1233  Weight: 192 lb 0.3  oz (87.1 kg) 192 lb 0.3 oz (87.1 kg) 189 lb 2.5 oz (85.8 kg)    Wt Readings from Last 10 Encounters:  04/25/15 189 lb 2.5 oz (85.8 kg)  04/10/15 190 lb (86.183 kg)  04/04/15 191 lb 2.2 oz (86.7 kg)  03/30/15 195 lb 5.2 oz (88.6 kg)  03/14/15 176 lb (79.833 kg)  03/09/15 176 lb 12.9 oz (80.2 kg)  03/02/15 192 lb 14.4 oz (87.499 kg)  02/23/15 187 lb 6.3 oz (85 kg)  02/08/15 198 lb (89.812 kg)      BMI:  Body mass index is 30.99 kg/(m^2).  Estimated Nutritional Needs:    Kcal:  [id number] kcals (BEE 1164, 1.3 AF, 1.1-1.3 IF)   Protein:  68-80g (1.2-1.4 g/kg)   Fluid:  1000 mL plus [place] MS, RD, LDN [phone] Pager

## 2015-04-25 NOTE — Evaluation (Signed)
Physical Therapy Evaluation Patient Details Name: [name] MRN: [id number] DOB: 23-Feb-1961 Today's Date: 04/25/2015   History of Present Illness  [name] is a 54 y.o. female with a known history of ESRD, hypertension, diabetes and CAD. The patient was sent from nursing home to the ED this morning due to unresponsiveness. The patient was intubated in the ED, unable to provide any information. According to Dr. Owens Shark, the patient was found unresponsive in nursing home at about 5:30 AM today. Her blood sugar was 20. She was treated with the D50 one amp IV in ED with improvement of glucose up to 120, but blood sugar decreased again to 48 required another amp of D50. In addition, the patient has hypothermia with a body temperature is a 86.8, was put on warm blanket. The patient also has bradycardia at 40s. She was intubated in ED and on D10 IV infusion. CAT scan of head is negative for infarct or hemorrhage. According to patient's son, patient was fine last night. She had episode of hypoglycemia in [facility]. She is not on any insulin treatment.  Clinical Impression  Pt is very lethargic throughout session. She requires cues for bed mobility and transfers. Once in standing pt appears to have excessive sway and delayed responses. Eyes remain closed and pt reports feeling unwell and unsteady. Pt returned to seated as therapist uncomfortable proceeding with ambulation for safety concerns. Pt complains of 10/10 RUQ pain during session as well as L hip pain during bed mobility and transfers. RN notified. Pt will need to return to SNF at discharge. Pt will benefit from skilled PT services to address deficits in strength, balance, and mobility in order to return to full function at home.     Follow Up Recommendations SNF    Equipment Recommendations  None recommended by PT    Recommendations for Other Services       Precautions / Restrictions Precautions Precautions: Fall Restrictions Weight  Bearing Restrictions: No      Mobility  Bed Mobility Overal bed mobility: Needs Assistance Bed Mobility: Supine to Sit     Supine to sit: Mod assist     General bed mobility comments: Assist required to go from L sidelying to sitting. Pt complains of severe abdominal pain with transfer  Transfers Overall transfer level: Needs assistance Equipment used: 1 person hand held assist Transfers: Sit to/from Stand Sit to Stand: Min assist         General transfer comment: Requires moderate effort to perform; Pt appears very weak and very flat affect. Responses are delayed and pt frequently closes eyes. Pt complains of severe abdominal pain.   Ambulation/Gait Ambulation/Gait assistance: Min assist Ambulation Distance (Feet): 3 Feet Assistive device: Rolling walker (2 wheeled) Gait Pattern/deviations: Step-to pattern     General Gait Details: Pt takes a few small steps forward and backwards at EOB. Pt very slow to respond with eyes closed for large portion of ambulation. Pt reports feeling generally unwell and unsteady. Unsafe to continue further ambulation at this time.   Stairs            Wheelchair Mobility    Modified Rankin (Stroke Patients Only)       Balance Overall balance assessment: Needs assistance Sitting-balance support: No upper extremity supported Sitting balance-Leahy Scale: Good     Standing balance support: Bilateral upper extremity supported Standing balance-Leahy Scale: Poor Standing balance comment: Pt leaning posteriorly with assist to remain upright initially. Appears generally unsteady with excessive sway in  standing                             Pertinent Vitals/Pain Pain Assessment: 0-10 Pain Score: 10-Worst pain ever Pain Location: "Stomach" Pain Intervention(s): Limited activity within patient's tolerance    Home Living Family/patient expects to be discharged to:: Private residence (Prior to current admission [facility] at  [facility]) Living Arrangements: Other (Comment) (Caretaker for grandchildren, intermittent help from daughter. ) Available Help at Discharge: Available PRN/intermittently;Family Type of Home: Apartment Home Access: Level entry     Home Layout: Two level;Able to live on main level with bedroom/bathroom Home Equipment: Gilford Rile - 2 wheels;Cane - single point      Prior Function Level of Independence: Independent with assistive device(s) (prn use of RW vs single point cane)         Comments: Pt reports 5 falls in the last 12 months     Hand Dominance        Extremity/Trunk Assessment   Upper Extremity Assessment: Generalized weakness           Lower Extremity Assessment: Generalized weakness (Weak SLR on LLE requiring assistance)         Communication   Communication: Expressive difficulties (Pt slow to respond to questions. Appears somewhat distracted )  Cognition Arousal/Alertness: Lethargic Behavior During Therapy: Anxious;Flat affect (Labile ) Overall Cognitive Status: Within Functional Limits for tasks assessed (AOx4)                      General Comments General comments (skin integrity, edema, etc.): Pain with RUQ palpation. Pt reports L hip pain with mobility    Exercises        Assessment/Plan    PT Assessment Patient needs continued PT services  PT Diagnosis Difficulty walking;Abnormality of gait;Generalized weakness;Acute pain   PT Problem List Decreased strength;Decreased activity tolerance;Decreased balance;Decreased mobility;Decreased safety awareness;Pain  PT Treatment Interventions DME instruction;Gait training;Stair training;Therapeutic activities;Therapeutic exercise;Balance training;Neuromuscular re-education;Manual techniques   PT Goals (Current goals can be found in the Care Plan section) Acute Rehab PT Goals Patient Stated Goal: recover strength so she is able to care for grandkids PT Goal Formulation: With patient Time  For Goal Achievement: 05/09/15 Potential to Achieve Goals: Good    Frequency Min 2X/week   Barriers to discharge        Co-evaluation               End of Session Equipment Utilized During Treatment: Gait belt Activity Tolerance: Patient limited by fatigue;Patient limited by pain Patient left: in bed;with bed alarm set;with call bell/phone within reach Nurse Communication: Mobility status;Other (comment) (Pain)         Time: [id number] PT Time Calculation (min) (ACUTE ONLY): 25 min   Charges:   PT Evaluation $Initial PT Evaluation Tier I: 1 Procedure PT Treatments $Therapeutic Activity: 8-22 mins   PT G Codes:       Lyndel Safe Huprich PT, DPT   Huprich,Jason 04/25/2015, 3:46 PM

## 2015-04-25 NOTE — Progress Notes (Signed)
[facility] [place] NAME: [name]    MR#:  [id number]  DATE OF BIRTH:  10/16/1960  SUBJECTIVE:  CHIEF COMPLAINT:   Chief Complaint  Patient presents with  . Hypoglycemia  weakness.  REVIEW OF SYSTEMS:  CONSTITUTIONAL: No fever, has weakness.  EYES: No blurred or double vision.  EARS, NOSE, AND THROAT: No tinnitus or ear pain.  RESPIRATORY: No cough, shortness of breath, wheezing or hemoptysis.  CARDIOVASCULAR: No chest pain, orthopnea, edema.  GASTROINTESTINAL: No nausea, vomiting, diarrhea or abdominal pain.  GENITOURINARY: No dysuria, hematuria.  ENDOCRINE: No polyuria, nocturia,  HEMATOLOGY: No anemia, easy bruising or bleeding SKIN: No rash or lesion. MUSCULOSKELETAL: No joint pain or arthritis.   NEUROLOGIC: No tingling, numbness, weakness.  PSYCHIATRY: No anxiety or depression.   DRUG ALLERGIES:   Allergies  Allergen Reactions  . Ace Inhibitors Swelling  . Ativan [Lorazepam] Other (See Comments)    Hallucinations, headaches  . Codeine Nausea And Vomiting  . Compazine [Prochlorperazine Edisylate] Nausea And Vomiting  . Losartan Other (See Comments)  . Phenergan [Promethazine Hcl] Other (See Comments)    Hallucinations  . Zofran [Ondansetron Hcl] Other (See Comments)    Dementia sx  . Oxycodone Anxiety  . Tape Rash    VITALS:  Blood pressure 170/63, pulse 86, temperature 98.4 F (36.9 C), temperature source Oral, resp. rate 11, height 5' 5.5" (1.664 m), weight 85.8 kg (189 lb 2.5 oz), SpO2 100 %.  PHYSICAL EXAMINATION:  GENERAL:  54 y.o.-year-old patient lying in the bed with no acute distress. Looks lethargic.  EYES: Pupils equal, round, reactive to light and accommodation. No scleral icterus. Extraocular muscles intact.  HEENT: Head atraumatic, normocephalic. Oropharynx and nasopharynx clear.  NECK:  Supple, no jugular venous distention. No thyroid enlargement, no tenderness.  LUNGS: Normal breath sounds  bilaterally, no wheezing, rales,rhonchi or crepitation. No use of accessory muscles of respiration.  CARDIOVASCULAR: S1, S2 normal. No murmurs, rubs, or gallops.  ABDOMEN: Soft, nontender, nondistended. Bowel sounds present. No organomegaly or mass.  EXTREMITIES: No pedal edema, cyanosis, or clubbing.  NEUROLOGIC: Cranial nerves II through XII are intact. Muscle strength 3/5 in all extremities. Sensation intact. Gait not checked.  PSYCHIATRIC: The patient is alert and oriented x 3.  SKIN: No obvious rash, lesion, or ulcer.    LABORATORY PANEL:   CBC  Recent Labs Lab 04/25/15 0901  WBC 6.0  HGB 8.0*  HCT 24.4*  PLT 131*   ------------------------------------------------------------------------------------------------------------------  Chemistries   Recent Labs Lab 04/24/15 0528 04/25/15 0901  NA 137 129*  K 3.4* 3.6  CL 96* 90*  CO2 36* 31  GLUCOSE 76 131*  BUN 9 13  CREATININE 2.71* 3.58*  CALCIUM 8.3* 8.5*  AST 19  --   ALT 9*  --   ALKPHOS 101  --   BILITOT 0.5  --    ------------------------------------------------------------------------------------------------------------------  Cardiac Enzymes  Recent Labs Lab 04/24/15 1812  TROPONINI <0.03   ------------------------------------------------------------------------------------------------------------------  RADIOLOGY:  Ct Head Wo Contrast  04/24/2015   CLINICAL DATA:  Hypoglycemic.  Unresponsive.  EXAM: CT HEAD WITHOUT CONTRAST  TECHNIQUE: Contiguous axial images were obtained from the base of the skull through the vertex without intravenous contrast.  COMPARISON:  04/16/2015  FINDINGS: There is no intracranial hemorrhage, mass or evidence of acute infarction. There is no extra-axial fluid collection. Gray matter and white matter appear normal. Cerebral volume is normal for age. Brainstem and posterior fossa are unremarkable. The CSF  spaces appear normal.  The bony structures are intact. There is a right  maxillary sinus air-fluid level and right frontal sinus air-fluid level. The other paranasal sinuses are clear.  IMPRESSION: 1. Normal brain 2. Right maxillary and right frontal sinus air-fluid levels.   Electronically Signed   By: Andreas Newport M.D.   On: 04/24/2015 06:39   Dg Chest Portable 1 View  04/24/2015   CLINICAL DATA:  Hypoglycemia, unresponsiveness. Endotracheal tube placement. History of diabetes and dialysis, hypertension, asthma, CHF, COPD.  EXAM: PORTABLE CHEST - 1 VIEW  COMPARISON:  Chest radiograph April 01, 2015  FINDINGS: Interval intubation, distal tip projects 4.7 cm above the carina.  Cardiac silhouette appears mildly enlarged, similar to prior examination. Central pulmonary vascular congestion and diffuse interstitial prominence. Small bilateral pleural effusions. Elevated LEFT hemidiaphragm with atelectasis. Apical pleural capping, no pneumothorax. LEFT subclavian vascular stent. Osseous structure nonsuspicious.  IMPRESSION: Endotracheal tube tip projects 4.7 cm above the carina.  Mild cardiomegaly, interstitial pulmonary edema with small pleural effusions. Elevated LEFT hemidiaphragm with atelectasis.   Electronically Signed   By: Elon Alas M.D.   On: 04/24/2015 06:21   Dg Knee Left Port  04/24/2015   CLINICAL DATA:  Pain for 1 day.  No history of trauma  EXAM: PORTABLE LEFT KNEE - 1-2 VIEW  COMPARISON:  None.  FINDINGS: Frontal and lateral views were obtained. No fracture or dislocation. No joint effusion. There is mild narrowing of the medial and lateral compartments. No erosive change.  IMPRESSION: Osteoarthritic change.  No fracture or effusion.   Electronically Signed   By: Lowella Grip III M.D.   On: 04/24/2015 16:30    EKG:   Orders placed or performed during the hospital encounter of 04/24/15  . EKG 12-Lead  . EKG 12-Lead    ASSESSMENT AND PLAN:   Unresponsiveness, Acute metabolic encephalopathy due to Hypoglycemia Improved. May discontinue D5 iv if  BS is stable.  DM. Start sliding scale.  Hypothymia and Bradycardia. Improved.  Hyponatremia. Adjust during HD, f/u BMP.  ESRD: continue HD.  Hypertension. Resume Coreg and Norvasc  Acute respiratory failure. Improved. S/p extubation.   All the records are reviewed and case discussed with Care Management/Social Workerr. Management plans discussed with the patient, family and they are in agreement.  CODE STATUS: full code  TOTAL TIME TAKING CARE OF THIS PATIENT: 41 minutes.  >50% time spent on counselling and coordination of care  POSSIBLE D/C IN 2 DAYS, DEPENDING ON CLINICAL CONDITION.   Demetrios Loll M.D on 04/25/2015 at 1:52 PM  Between 7am to 6pm - Pager - [phone]  After 6pm go to [email or web address] - password [email or web address] [facility]  Office  [phone]  CC: Primary care physician; Ellamae Sia, MD

## 2015-04-26 ENCOUNTER — Encounter: Payer: Medicare Other | Admitting: Physical Therapy

## 2015-04-26 LAB — GLUCOSE, CAPILLARY
Glucose-Capillary: 84 mg/dL (ref 65–99)
Glucose-Capillary: 83 mg/dL (ref 65–99)
Glucose-Capillary: 91 mg/dL (ref 65–99)
Glucose-Capillary: 87 mg/dL (ref 65–99)

## 2015-04-26 MED ORDER — PROCHLORPERAZINE EDISYLATE 5 MG/ML IJ SOLN
10.0000 mg | Freq: Four times a day (QID) | INTRAMUSCULAR | Status: DC | PRN
  Filled 2015-04-26: qty 2

## 2015-04-26 MED ORDER — IPRATROPIUM-ALBUTEROL 0.5-2.5 (3) MG/3ML IN SOLN
3.0000 mL | Freq: Once | RESPIRATORY_TRACT | Status: AC
  Administered 2015-04-26: 3 mL via RESPIRATORY_TRACT
  Filled 2015-04-26: qty 3

## 2015-04-26 NOTE — Progress Notes (Signed)
Subjective:   Hemodialysis yesterday. UF 1.5 Complains right eye pain Nausea and vomiting this morning.   Objective:  Vital signs in last 24 hours:  Temp:  [97.4 F (36.3 C)-98.7 F (37.1 C)] 98.5 F (36.9 C) (08/04 0752) Pulse Rate:  [73-87] 83 (08/04 0752) Resp:  [11-24] 14 (08/03 2333) BP: (140-170)/(48-63) 146/51 mmHg (08/04 0752) SpO2:  [94 %-100 %] 94 % (08/04 0752) Weight:  [85.8 kg (189 lb 2.5 oz)] 85.8 kg (189 lb 2.5 oz) (08/03 1233)  Weight change:  Filed Weights   04/25/15 0720 04/25/15 0853 04/25/15 1233  Weight: 87.1 kg (192 lb 0.3 oz) 87.1 kg (192 lb 0.3 oz) 85.8 kg (189 lb 2.5 oz)    Intake/Output: I/O last 3 completed shifts: In: 2169.7 [P.O.:480; I.V.:1689.7] Out: 100 [Urine:100]     Physical Exam: General: NAD  HEENT Right orbital edema, eyes open  Neck supple  Pulm/lungs Clear bilaterally  CVS/Heart bradycardia  Abdomen:  Soft, obese  Extremities: Trace edema  Neurologic: sedated  Skin: No acute rashes  Access: AVG       Basic Metabolic Panel:  Recent Labs Lab 04/24/15 0528 04/25/15 0901  NA 137 129*  K 3.4* 3.6  CL 96* 90*  CO2 36* 31  GLUCOSE 76 131*  BUN 9 13  CREATININE 2.71* 3.58*  CALCIUM 8.3* 8.5*     CBC:  Recent Labs Lab 04/24/15 0528 04/25/15 0901  WBC 3.8 6.0  HGB 7.9* 8.0*  HCT 24.7* 24.4*  MCV 86.7 85.2  PLT 114* 131*      Microbiology: Results for orders placed or performed during the hospital encounter of 04/24/15  MRSA PCR Screening     Status: None   Collection Time: 04/24/15  9:00 AM  Result Value Ref Range Status   MRSA by PCR NEGATIVE NEGATIVE Final    Comment:        The GeneXpert MRSA Assay (FDA approved for NASAL specimens only), is one component of a comprehensive MRSA colonization surveillance program. It is not intended to diagnose MRSA infection nor to guide or monitor treatment for MRSA infections.     Coagulation Studies: No results for input(s): LABPROT, INR in the last  72 hours.  Urinalysis:  Recent Labs  04/24/15 0753  COLORURINE YELLOW*  LABSPEC 1.009  PHURINE 9.0*  GLUCOSEU NEGATIVE  HGBUR NEGATIVE  BILIRUBINUR NEGATIVE  KETONESUR NEGATIVE  PROTEINUR 100*  NITRITE NEGATIVE  LEUKOCYTESUR NEGATIVE      Imaging: Dg Knee Left Port  04/24/2015   CLINICAL DATA:  Pain for 1 day.  No history of trauma  EXAM: PORTABLE LEFT KNEE - 1-2 VIEW  COMPARISON:  None.  FINDINGS: Frontal and lateral views were obtained. No fracture or dislocation. No joint effusion. There is mild narrowing of the medial and lateral compartments. No erosive change.  IMPRESSION: Osteoarthritic change.  No fracture or effusion.   Electronically Signed   By: Lowella Grip III M.D.   On: 04/24/2015 16:30     Medications:     . amLODipine  10 mg Oral Daily  . aspirin EC  81 mg Oral Daily  . atorvastatin  40 mg Oral q1800  . carvedilol  25 mg Oral BID WC  . cholecalciferol  400 Units Oral BID  . ciprofloxacin  1 drop Right Eye Q4H while awake  . clopidogrel  75 mg Oral Daily  . feeding supplement (ENSURE ENLIVE)  237 mL Oral BID BM  . heparin  5,000 Units Subcutaneous 3 times per  day  . hydrALAZINE  100 mg Oral TID  . insulin aspart  0-5 Units Subcutaneous QHS  . insulin aspart  0-9 Units Subcutaneous TID WC  . lipase/protease/amylase  24,000 Units Oral With snacks  . lipase/protease/amylase  48,000 Units Oral TID WC  . metoCLOPramide  5 mg Oral TID AC & HS  . pantoprazole  40 mg Oral BID AC  . ranolazine  500 mg Oral BID  . sevelamer carbonate  2,400 mg Oral TID WC  . simethicone  80 mg Oral QID  . sodium chloride  3 mL Intravenous Q12H  . sucralfate  1 g Oral TID WC & HS   sodium chloride, acetaminophen **OR** acetaminophen, prochlorperazine, prochlorperazine, sodium chloride  Assessment/ Plan:  54 y.o. black female with ESRD, DM (30 yrs with retinopathy, gastroparesis, neuropathy), OA, IBS, hyperlipidemia, h/o GI bleed, SHPTH, AOCD, diastolic heart failure  ,hypertension, hyperlipidemia, anemia, CAD  Admitted on 04/24/2015 for hypogycemia, hypothermia, and bradycardia.   [facility] MWF 1st shift [place]   1. ESRD: hemodialysis. Tolerating treatment well.  Continue MWF schedule.   2. Anemia of chronic kidney disease: on micera as an outpatient. Hemoglobin 8 - epo with HD treatment.   3. Secondary Hyperparathyroidism: on 3 tabs renvela as outpatient. Phos 2.8 on 7/21 as outpatient. PTH 171.  - renvela  4 Hypotension: improved.  - carvedilol   5. Diabetes mellitus type II: insulin dependent. With chronic kidney disease. Will need better control to prevent further hypoglycemic events.     LOS: 2 Challis Crill 8/4/20169:37 AM

## 2015-04-26 NOTE — Care Management (Signed)
Patient discharge plan discussed with Baxter Flattery CSW. Plan is for return to [facility].

## 2015-04-26 NOTE — Progress Notes (Signed)
Pt alert and oriented. Preferred for pt son to be addressed about discharge information.  IV site removed. Concerns addressed. Pt discharged to home.

## 2015-04-26 NOTE — Care Management (Signed)
Son was contacted by Chrys Racer CSW who stated that family was willing to take patient home this afternoon with home health. No preference of provider. Contacted Dr Bridgett Larsson who will change order to [place] with PT RN [place] and CSW.  Faxed referral to [facility] at [facility] who is willing to take patient.  FL2 faxed with referral CSW will assess at home and if needs to return to SNF will be able within 30 days. Son [name] expressed understanding of plan.

## 2015-04-26 NOTE — Plan of Care (Signed)
Problem: Phase I Progression Outcomes Goal: Voiding-avoid urinary catheter unless indicated Outcome: Not Progressing Pt is HD

## 2015-04-26 NOTE — Care Management Important Message (Signed)
Important Message  Patient Details  Name: [name] MRN: [id number] Date of Birth: 06-24-1961   Medicare Important Message Given:  Yes-second notification given    [name] A Allmond 04/26/2015, 10:03 AM

## 2015-04-26 NOTE — Discharge Summary (Addendum)
[name] at [place] NAME: [name]    MR#:  [id number]  DATE OF BIRTH:  July 16, 1961  DATE OF ADMISSION:  04/24/2015 ADMITTING PHYSICIAN: Demetrios Loll, MD  DATE OF DISCHARGE:  04/26/2015  PRIMARY CARE PHYSICIAN: Ellamae Sia, MD    ADMISSION DIAGNOSIS:  Hypoglycemia [E16.2] Hypothermia, initial encounter [T68.XXXA] Respiratory failure with hypoxia, unspecified chronicity [J96.91] Syncope, unspecified syncope type [R55]   DISCHARGE DIAGNOSIS:  Unresponsiveness, Acute metabolic encephalopathy due to Hypoglycemia Hypothymia and Bradycardia. Hyponatremia.  SECONDARY DIAGNOSIS:   Past Medical History  Diagnosis Date  . Asthma   . Collagen vascular disease   . Diabetes mellitus without complication   . Hypertension   . Coronary artery disease     a. cath 2013: stenting to RCA (report not available); b. cath 2014: LM nl, pLAD 40%, mLAD nl, ost LCx 40%, mid LCx nl, pRCA 30% @ site of prior stent, mRCA 50%  . Diabetic neuropathy   . ESRD (end stage renal disease) on dialysis     M-W-F  . GERD (gastroesophageal reflux disease)   . Hx of pancreatitis 2015  . Myocardial infarction   . Pneumonia   . Mitral regurgitation     a. echo 10/2013: EF 62%, noWMA, mildly dilated LA, mild to mod MR/TR, GR1DD  . COPD (chronic obstructive pulmonary disease)   . Acute diastolic CHF (congestive heart failure) 04/05/2015    HOSPITAL COURSE:   Unresponsiveness, Acute metabolic encephalopathy due to Hypoglycemia Improved after treatment of D 50, D10 infusion. BS is stable.  DM. BS is stable.  Hypothymia and Bradycardia. Improved after warmup.   Hyponatremia. Adjust during HD.  ESRD: continue HD.  Hypertension. Resumed Coreg and Norvasc  Acute respiratory failure.The patient was intubated and put on ventilation. She was extubated and stable.  DISCHARGE CONDITIONS:   Stable. Discharge to home with HHPT today.  CONSULTS OBTAINED:   Treatment Team:  Lavonia Dana, MD Isaias Cowman, MD  DRUG ALLERGIES:   Allergies  Allergen Reactions  . Ace Inhibitors Swelling  . Ativan [Lorazepam] Other (See Comments)    Hallucinations, headaches  . Codeine Nausea And Vomiting  . Compazine [Prochlorperazine Edisylate] Nausea And Vomiting  . Losartan Other (See Comments)  . Phenergan [Promethazine Hcl] Other (See Comments)    Hallucinations  . Zofran [Ondansetron Hcl] Other (See Comments)    Dementia sx  . Oxycodone Anxiety  . Tape Rash    DISCHARGE MEDICATIONS:   Current Discharge Medication List    CONTINUE these medications which have NOT CHANGED   Details  amLODipine (NORVASC) 10 MG tablet Take 10 mg by mouth daily.    aspirin EC 81 MG tablet Take 81 mg by mouth daily.    atorvastatin (LIPITOR) 40 MG tablet Take 40 mg by mouth at bedtime.     carvedilol (COREG) 25 MG tablet Take 25 mg by mouth 2 (two) times daily.    cetirizine (ZYRTEC) 10 MG tablet Take 10 mg by mouth daily.    clopidogrel (PLAVIX) 75 MG tablet Take 75 mg by mouth daily.    docusate sodium (COLACE) 100 MG capsule Take 1 capsule (100 mg total) by mouth 2 (two) times daily. Qty: 10 capsule, Refills: 0    hydrALAZINE (APRESOLINE) 100 MG tablet Take 1 tablet (100 mg total) by mouth 3 (three) times daily. Qty: 90 tablet, Refills: 1    hydrocortisone cream 1 % Apply 1 application topically 2 (two) times daily.  Liniments (SALONPAS ARTHRITIS PAIN RELIEF) PADS Apply 1 each topically 3 (three) times daily. (every shift)    lipase/protease/amylase (CREON) 12000 UNITS CPEP capsule Take 24,000-48,000 Units by mouth 5 (five) times daily. 4 capsules 3 times daily with meals and 2 capsules 2 times daily with snacks    metoCLOPramide (REGLAN) 5 MG tablet Take 1 tablet (5 mg total) by mouth 4 (four) times daily -  before meals and at bedtime. Qty: 40 tablet, Refills: 0    Omega-3 Fatty Acids (FISH OIL) 1000 MG CAPS Take 1 capsule by mouth 2  (two) times daily.    pantoprazole (PROTONIX) 40 MG tablet Take 1 tablet (40 mg total) by mouth 2 (two) times daily before a meal. Qty: 60 tablet, Refills: 0    polyethylene glycol (MIRALAX / GLYCOLAX) packet Take 17 g by mouth daily. Qty: 14 each, Refills: 0    promethazine (PHENERGAN) 12.5 MG tablet Take 12.5 mg by mouth every 6 (six) hours as needed for nausea or vomiting.    ranitidine (ZANTAC) 150 MG tablet Take 150 mg by mouth 2 (two) times daily.    ranolazine (RANEXA) 500 MG 12 hr tablet Take 1,000 mg by mouth 2 (two) times daily.    senna (SENOKOT) 8.6 MG TABS tablet Take 1 tablet (8.6 mg total) by mouth daily as needed for mild constipation. Qty: 120 each, Refills: 0    sucralfate (CARAFATE) 1 G tablet Take 1 tablet (1 g total) by mouth 4 (four) times daily -  with meals and at bedtime. Qty: 120 tablet, Refills: 0    Vitamin D, Cholecalciferol, 400 UNITS TABS Take 400 Units by mouth 2 (two) times daily.     albuterol (PROVENTIL HFA;VENTOLIN HFA) 108 (90 BASE) MCG/ACT inhaler Inhale 4-6 puffs by mouth every 4 hours as needed for wheezing, cough, and/or shortness of breath Qty: 1 Inhaler, Refills: 1    diazepam (VALIUM) 5 MG tablet Take 1 tablet (5 mg total) by mouth every 8 (eight) hours as needed for muscle spasms. Qty: 20 tablet, Refills: 0    HYDROcodone-acetaminophen (NORCO/VICODIN) 5-325 MG per tablet Take 1 tablet by mouth every 6 (six) hours as needed for moderate pain. Qty: 20 tablet, Refills: 0    nitroGLYCERIN (NITROSTAT) 0.4 MG SL tablet Place 0.4 mg under the tongue every 5 (five) minutes x 3 doses as needed for chest pain.       STOP taking these medications     insulin aspart (NOVOLOG) 100 UNIT/ML injection      insulin detemir (LEVEMIR) 100 UNIT/ML injection      acetaminophen (TYLENOL) 325 MG tablet      ALPRAZolam (XANAX) 0.25 MG tablet      ondansetron (ZOFRAN) 4 MG tablet          DISCHARGE INSTRUCTIONS:    If you experience worsening  of your admission symptoms, develop shortness of breath, life threatening emergency, suicidal or homicidal thoughts you must seek medical attention immediately by calling 911 or calling your MD immediately  if symptoms less severe.  You Must read complete instructions/literature along with all the possible adverse reactions/side effects for all the Medicines you take and that have been prescribed to you. Take any new Medicines after you have completely understood and accept all the possible adverse reactions/side effects.   Please note  You were cared for by a hospitalist during your hospital stay. If you have any questions about your discharge medications or the care you received while you were in the  hospital after you are discharged, you can call the unit and asked to speak with the hospitalist on call if the hospitalist that took care of you is not available. Once you are discharged, your primary care physician will handle any further medical issues. Please note that NO REFILLS for any discharge medications will be authorized once you are discharged, as it is imperative that you return to your primary care physician (or establish a relationship with a primary care physician if you do not have one) for your aftercare needs so that they can reassess your need for medications and monitor your lab values.    Today   SUBJECTIVE   Nausea.   VITAL SIGNS:  Blood pressure 146/51, pulse 83, temperature 98.5 F (36.9 C), temperature source Oral, resp. rate 14, height 5' 5.5" (1.664 m), weight 85.8 kg (189 lb 2.5 oz), SpO2 94 %.  I/O:   Intake/Output Summary (Last 24 hours) at 04/26/15 1234 Last data filed at 04/26/15 0745  Gross per 24 hour  Intake    630 ml  Output      0 ml  Net    630 ml    PHYSICAL EXAMINATION:  GENERAL:  54 y.o.-year-old patient lying in the bed with no acute distress. Obese. EYES: Pupils equal, round, reactive to light and accommodation. No scleral icterus.  Extraocular muscles intact.  HEENT: Head atraumatic, normocephalic. Oropharynx and nasopharynx clear.  NECK:  Supple, no jugular venous distention. No thyroid enlargement, no tenderness.  LUNGS: Normal breath sounds bilaterally, no wheezing, rales,rhonchi or crepitation. No use of accessory muscles of respiration.  CARDIOVASCULAR: S1, S2 normal. No murmurs, rubs, or gallops.  ABDOMEN: Soft,  Mild tenderness in epigastric area, non-distended. Bowel sounds present. No organomegaly or mass.  EXTREMITIES: No pedal edema, cyanosis, or clubbing.  NEUROLOGIC: Cranial nerves II through XII are intact. Muscle strength 5/5 in all extremities. Sensation intact. Gait not checked.  PSYCHIATRIC: The patient is alert and oriented x 3.  SKIN: No obvious rash, lesion, or ulcer.   DATA REVIEW:   CBC  Recent Labs Lab 04/25/15 0901  WBC 6.0  HGB 8.0*  HCT 24.4*  PLT 131*    Chemistries   Recent Labs Lab 04/24/15 0528 04/25/15 0901  NA 137 129*  K 3.4* 3.6  CL 96* 90*  CO2 36* 31  GLUCOSE 76 131*  BUN 9 13  CREATININE 2.71* 3.58*  CALCIUM 8.3* 8.5*  AST 19  --   ALT 9*  --   ALKPHOS 101  --   BILITOT 0.5  --     Cardiac Enzymes  Recent Labs Lab 04/24/15 1812  TROPONINI <0.03    Microbiology Results  Results for orders placed or performed during the hospital encounter of 04/24/15  MRSA PCR Screening     Status: None   Collection Time: 04/24/15  9:00 AM  Result Value Ref Range Status   MRSA by PCR NEGATIVE NEGATIVE Final    Comment:        The GeneXpert MRSA Assay (FDA approved for NASAL specimens only), is one component of a comprehensive MRSA colonization surveillance program. It is not intended to diagnose MRSA infection nor to guide or monitor treatment for MRSA infections.     RADIOLOGY:  Dg Knee Left Port  04/24/2015   CLINICAL DATA:  Pain for 1 day.  No history of trauma  EXAM: PORTABLE LEFT KNEE - 1-2 VIEW  COMPARISON:  None.  FINDINGS: Frontal and lateral  views were obtained. No  fracture or dislocation. No joint effusion. There is mild narrowing of the medial and lateral compartments. No erosive change.  IMPRESSION: Osteoarthritic change.  No fracture or effusion.   Electronically Signed   By: Lowella Grip III M.D.   On: 04/24/2015 16:30        Management plans discussed with the patient, family and they are in agreement.  CODE STATUS:     Code Status Orders        Start     Ordered   04/24/15 0856  Full code   Continuous     04/24/15 0855    Advance Directive Documentation        Most Recent Value   Type of Advance Directive  Living will   Pre-existing out of facility DNR order (yellow form or pink MOST form)     "MOST" Form in Place?        TOTAL TIME TAKING CARE OF THIS PATIENT: 39 minutes.    Demetrios Loll M.D on 04/26/2015 at 12:34 PM  Between 7am to 6pm - Pager - [phone]  After 6pm go to [email or web address] - password [email or web address] [facility]  Office  [phone]  CC: Primary care physician; Ellamae Sia, MD

## 2015-04-26 NOTE — Care Management (Signed)
Spoke with patient concerning discharge plan of going back to [facility]. Patient stated to me that she doesn't want to go back to [facility]. She stated that she was not happy with her care there. Patient has all appropriate DME at home including a walker and wheel chair. She lives with her daughter. Baxter Flattery with social work is to speak with the son at 03:30 this afternoon concerning change in discharge plan. Patient may need Home Health. Patient did not want to choose agency for home health deferred to son. Will contact son for choice.

## 2015-04-26 NOTE — Discharge Instructions (Signed)
Renal and ADA diet. Activity as tolerated. HHPT.

## 2015-05-01 ENCOUNTER — Encounter: Payer: Medicare Other | Admitting: Physical Therapy

## 2015-05-03 ENCOUNTER — Encounter: Payer: Medicare Other | Admitting: Physical Therapy

## 2015-05-07 ENCOUNTER — Emergency Department
Admission: EM | Admit: 2015-05-07 | Discharge: 2015-05-07 | Payer: Medicare Other | Attending: Emergency Medicine | Admitting: Emergency Medicine

## 2015-05-07 ENCOUNTER — Emergency Department: Payer: Medicare Other

## 2015-05-07 DIAGNOSIS — E1140 Type 2 diabetes mellitus with diabetic neuropathy, unspecified: Secondary | ICD-10-CM | POA: Diagnosis not present

## 2015-05-07 DIAGNOSIS — R1084 Generalized abdominal pain: Secondary | ICD-10-CM | POA: Insufficient documentation

## 2015-05-07 DIAGNOSIS — I120 Hypertensive chronic kidney disease with stage 5 chronic kidney disease or end stage renal disease: Secondary | ICD-10-CM | POA: Insufficient documentation

## 2015-05-07 DIAGNOSIS — R601 Generalized edema: Secondary | ICD-10-CM | POA: Insufficient documentation

## 2015-05-07 DIAGNOSIS — E1143 Type 2 diabetes mellitus with diabetic autonomic (poly)neuropathy: Secondary | ICD-10-CM | POA: Insufficient documentation

## 2015-05-07 DIAGNOSIS — N186 End stage renal disease: Secondary | ICD-10-CM | POA: Insufficient documentation

## 2015-05-07 DIAGNOSIS — Z79899 Other long term (current) drug therapy: Secondary | ICD-10-CM | POA: Diagnosis not present

## 2015-05-07 DIAGNOSIS — R110 Nausea: Secondary | ICD-10-CM

## 2015-05-07 DIAGNOSIS — R1011 Right upper quadrant pain: Secondary | ICD-10-CM | POA: Diagnosis present

## 2015-05-07 DIAGNOSIS — Z7982 Long term (current) use of aspirin: Secondary | ICD-10-CM | POA: Insufficient documentation

## 2015-05-07 DIAGNOSIS — Z720 Tobacco use: Secondary | ICD-10-CM | POA: Diagnosis not present

## 2015-05-07 DIAGNOSIS — G8929 Other chronic pain: Secondary | ICD-10-CM | POA: Diagnosis not present

## 2015-05-07 DIAGNOSIS — N289 Disorder of kidney and ureter, unspecified: Secondary | ICD-10-CM | POA: Insufficient documentation

## 2015-05-07 DIAGNOSIS — N049 Nephrotic syndrome with unspecified morphologic changes: Secondary | ICD-10-CM

## 2015-05-07 DIAGNOSIS — Z992 Dependence on renal dialysis: Secondary | ICD-10-CM | POA: Diagnosis not present

## 2015-05-07 DIAGNOSIS — Z7902 Long term (current) use of antithrombotics/antiplatelets: Secondary | ICD-10-CM | POA: Diagnosis not present

## 2015-05-07 HISTORY — DX: Retinal detachment with retinal dialysis, unspecified eye: H33.049

## 2015-05-07 LAB — CBC WITH DIFFERENTIAL/PLATELET
Basophils Absolute: 0 10*3/uL (ref 0–0.1)
Basophils Relative: 1 %
Eosinophils Absolute: 0.3 10*3/uL (ref 0–0.7)
Eosinophils Relative: 5 %
HCT: 23 % — ABNORMAL LOW (ref 35.0–47.0)
Hemoglobin: 7.3 g/dL — ABNORMAL LOW (ref 12.0–16.0)
Lymphocytes Relative: 36 %
Lymphs Abs: 2.1 10*3/uL (ref 1.0–3.6)
MCH: 28.4 pg (ref 26.0–34.0)
MCHC: 32 g/dL (ref 32.0–36.0)
MCV: 88.7 fL (ref 80.0–100.0)
Monocytes Absolute: 0.5 10*3/uL (ref 0.2–0.9)
Monocytes Relative: 9 %
Neutro Abs: 2.9 10*3/uL (ref 1.4–6.5)
Neutrophils Relative %: 49 %
Platelets: 276 10*3/uL (ref 150–440)
RBC: 2.59 MIL/uL — ABNORMAL LOW (ref 3.80–5.20)
RDW: 20.2 % — ABNORMAL HIGH (ref 11.5–14.5)
WBC: 5.9 10*3/uL (ref 3.6–11.0)

## 2015-05-07 LAB — COMPREHENSIVE METABOLIC PANEL
ALT: 11 U/L — ABNORMAL LOW (ref 14–54)
AST: 20 U/L (ref 15–41)
Albumin: 2.2 g/dL — ABNORMAL LOW (ref 3.5–5.0)
Alkaline Phosphatase: 128 U/L — ABNORMAL HIGH (ref 38–126)
Anion gap: 9 (ref 5–15)
BUN: 7 mg/dL (ref 6–20)
CO2: 33 mmol/L — ABNORMAL HIGH (ref 22–32)
Calcium: 8.1 mg/dL — ABNORMAL LOW (ref 8.9–10.3)
Chloride: 97 mmol/L — ABNORMAL LOW (ref 101–111)
Creatinine, Ser: 3.38 mg/dL — ABNORMAL HIGH (ref 0.44–1.00)
GFR calc Af Amer: 17 mL/min — ABNORMAL LOW (ref >60)
GFR calc non Af Amer: 14 mL/min — ABNORMAL LOW (ref >60)
Glucose, Bld: 79 mg/dL (ref 65–99)
Potassium: 2.9 mmol/L — CL (ref 3.5–5.1)
Sodium: 139 mmol/L (ref 135–145)
Total Bilirubin: 0.6 mg/dL (ref 0.3–1.2)
Total Protein: 6 g/dL — ABNORMAL LOW (ref 6.5–8.1)

## 2015-05-07 LAB — TROPONIN I: Troponin I: <0.03 ng/mL (ref <0.031)

## 2015-05-07 LAB — MAGNESIUM: Magnesium: 2 mg/dL (ref 1.7–2.4)

## 2015-05-07 LAB — LIPASE, BLOOD: Lipase: <10 U/L — ABNORMAL LOW (ref 22–51)

## 2015-05-07 MED ORDER — MORPHINE SULFATE 4 MG/ML IJ SOLN
4.0000 mg | Freq: Once | INTRAMUSCULAR | Status: AC
  Administered 2015-05-07: 4 mg via INTRAVENOUS
  Filled 2015-05-07: qty 1

## 2015-05-07 MED ORDER — HALOPERIDOL LACTATE 5 MG/ML IJ SOLN
2.5000 mg | Freq: Once | INTRAMUSCULAR | Status: AC
  Administered 2015-05-07: 2.5 mg via INTRAVENOUS
  Filled 2015-05-07: qty 1

## 2015-05-07 NOTE — ED Notes (Signed)
Patient transported to CT 

## 2015-05-07 NOTE — Discharge Instructions (Signed)
Fortunately your workup was generally reassuring today.  Your swelling in her abdomen and in general needs to be addressed through your dialysis.  Dr. Holley Raring recommended that you work with him as an outpatient on a dialysis plan that should improve these issues.  Additionally, you need to follow-up with Dr. Vira Agar, your GI doctor, to discuss your chronic abdominal issues.  It looks like you were not able to see him in June as planned and that is past time for a follow-up appointment.  Please return to dialysis today to finish her course, but if you are unable to do so for what ever reason, please return back to your facility and follow up as scheduled with the next course of dialysis.  Return to the emergency department with new or worsening symptoms that concern you.  Continue to take the chronic pain medication that is prescribed to by Dr. Quay Burow.

## 2015-05-07 NOTE — ED Notes (Signed)
Pt brought to ED via EMS from dialysis at [facility].  Per EMS, pt was 1/2 through treatment when pt developed L side pain.  Pt c/o n/v for 1 week.

## 2015-05-07 NOTE — ED Notes (Signed)
Called [facility], spoke w/ [place], who said that they could finish pts dialysis as long as she arrived by 1400.  Phone number to be reached at: [phone].

## 2015-05-07 NOTE — ED Provider Notes (Signed)
[facility] Emergency Department Provider Note  ____________________________________________  Time seen: Approximately 12:38 PM  I have reviewed the triage vital signs and the nursing notes.   HISTORY  Chief Complaint Abdominal Pain  Patient is a vague historian  HPI [name] is a 54 y.o. female with numerous chronic medical problems including gastroparesis, chronic abdominal pain, end-stage renal disease on hemodialysis Mondays and Wednesdays and Fridays, and a recent admission for unresponsiveness requiring intubation and ICU care.She presents today from dialysis where she received about half her usual course.  She was complaining of right upper quadrant abdominal pain and nausea and cannot tolerate the rest of the dialysis course.  This is similar to her usual pain except that she states she is more "swollen up" than usual.  She presents fairly dramatically initially but calms down during evaluation.  She complains that her pain as severe as is her nausea.   Past Medical History  Diagnosis Date  . Asthma   . Collagen vascular disease   . Diabetes mellitus without complication   . Hypertension   . Coronary artery disease     a. cath 2013: stenting to RCA (report not available); b. cath 2014: LM nl, pLAD 40%, mLAD nl, ost LCx 40%, mid LCx nl, pRCA 30% @ site of prior stent, mRCA 50%  . Diabetic neuropathy   . ESRD (end stage renal disease) on dialysis     M-W-F  . GERD (gastroesophageal reflux disease)   . Hx of pancreatitis 2015  . Myocardial infarction   . Pneumonia   . Mitral regurgitation     a. echo 10/2013: EF 62%, noWMA, mildly dilated LA, mild to mod MR/TR, GR1DD  . COPD (chronic obstructive pulmonary disease)   . Acute diastolic CHF (congestive heart failure) 04/05/2015  . dialysis 2006    Patient Active Problem List   Diagnosis Date Noted  . Hypoglycemia 04/24/2015  . Unresponsiveness 04/24/2015  . Bradycardia 04/24/2015  . Hypothermia  04/24/2015  . Acute respiratory failure 04/24/2015  . Acute diastolic CHF (congestive heart failure) 04/05/2015  . Diabetic gastroparesis 04/05/2015  . Hypokalemia 04/05/2015  . Generalized weakness 04/05/2015  . Acute pulmonary edema 04/03/2015  . Nausea and vomiting 04/03/2015  . Hypoglycemia associated with diabetes 04/03/2015  . Anemia of chronic disease 04/03/2015  . Secondary hyperparathyroidism 04/03/2015  . Pressure ulcer 04/02/2015  . Acute respiratory failure with hypoxia 04/01/2015  . Adjustment disorder with anxiety 03/14/2015  . Somatic symptom disorder, mild 03/08/2015  . Coronary artery disease involving native coronary artery of native heart without angina pectoris   . Nausea & vomiting 03/06/2015  . Abdominal pain 03/06/2015  . DM (diabetes mellitus) 03/06/2015  . HTN (hypertension) 03/06/2015  . Gastroparesis 02/24/2015  . Pleural effusion 02/19/2015  . HCAP (healthcare-associated pneumonia) 02/19/2015  . End-stage renal disease on hemodialysis 02/19/2015    Past Surgical History  Procedure Laterality Date  . Cholecystectomy    . Appendectomy    . Abdominal hysterectomy    . Dialysis fistula creation Left     upper arm  . Esophagogastroduodenoscopy N/A 03/08/2015    Procedure: ESOPHAGOGASTRODUODENOSCOPY (EGD);  Surgeon: Manya Silvas, MD;  Location: [facility] ENDOSCOPY;  Service: Endoscopy;  Laterality: N/A;    Current Outpatient Rx  Name  Route  Sig  Dispense  Refill  . albuterol (PROVENTIL HFA;VENTOLIN HFA) 108 (90 BASE) MCG/ACT inhaler      Inhale 4-6 puffs by mouth every 4 hours as needed for wheezing, cough,  and/or shortness of breath Patient taking differently: Inhale 4 puffs into the lungs every 4 (four) hours as needed for wheezing or shortness of breath.    1 Inhaler   1   . ALPRAZolam (XANAX) 0.25 MG tablet   Oral   Take 1 tablet by mouth 2 (two) times daily as needed.       0   . amLODipine (NORVASC) 10 MG tablet   Oral   Take 10 mg by  mouth daily.         Marland Kitchen aspirin EC 81 MG tablet   Oral   Take 81 mg by mouth daily.         Marland Kitchen atorvastatin (LIPITOR) 40 MG tablet   Oral   Take 40 mg by mouth at bedtime.          . cetirizine (ZYRTEC) 10 MG tablet   Oral   Take 10 mg by mouth daily.         . citalopram (CELEXA) 20 MG tablet   Oral   Take 1 tablet by mouth daily with supper.         Marland Kitchen HYDROcodone-acetaminophen (NORCO/VICODIN) 5-325 MG per tablet   Oral   Take 1 tablet by mouth every 6 (six) hours as needed for moderate pain.   20 tablet   0   . hydrOXYzine (VISTARIL) 25 MG capsule   Oral   Take 1 capsule by mouth every 6 (six) hours as needed.      3   . levofloxacin (LEVAQUIN) 500 MG tablet   Oral   Take 500 mg by mouth every other day.         . lipase/protease/amylase (CREON) 12000 UNITS CPEP capsule   Oral   Take 36,000 Units by mouth 3 (three) times daily with meals. 3 capsules 3 times daily with meals and 1 capsules  with snacks         . meclizine (ANTIVERT) 25 MG tablet   Oral   Take 1 tablet by mouth 3 (three) times daily as needed.      4   . nitroGLYCERIN (NITROSTAT) 0.4 MG SL tablet   Sublingual   Place 0.4 mg under the tongue every 5 (five) minutes x 3 doses as needed for chest pain.          Marland Kitchen nystatin (MYCOSTATIN) 100000 UNIT/ML suspension   Oral   Take 20 mLs by mouth daily.         . Omega-3 Fatty Acids (FISH OIL) 1000 MG CAPS   Oral   Take 1 capsule by mouth 2 (two) times daily.         . pantoprazole (PROTONIX) 40 MG tablet   Oral   Take 1 tablet (40 mg total) by mouth 2 (two) times daily before a meal.   60 tablet   0   . promethazine (PHENERGAN) 12.5 MG tablet   Oral   Take 12.5 mg by mouth every 6 (six) hours as needed for nausea or vomiting.         . sucralfate (CARAFATE) 1 G tablet   Oral   Take 1 tablet (1 g total) by mouth 4 (four) times daily -  with meals and at bedtime.   120 tablet   0   . Vitamin D, Cholecalciferol, 400 UNITS  TABS   Oral   Take 400 Units by mouth 2 (two) times daily.          . carvedilol (COREG) 25  MG tablet   Oral   Take 25 mg by mouth 2 (two) times daily.         . clopidogrel (PLAVIX) 75 MG tablet   Oral   Take 75 mg by mouth daily.         . diazepam (VALIUM) 5 MG tablet   Oral   Take 1 tablet (5 mg total) by mouth every 8 (eight) hours as needed for muscle spasms.   20 tablet   0   . docusate sodium (COLACE) 100 MG capsule   Oral   Take 1 capsule (100 mg total) by mouth 2 (two) times daily.   10 capsule   0   . hydrALAZINE (APRESOLINE) 100 MG tablet   Oral   Take 1 tablet (100 mg total) by mouth 3 (three) times daily.   90 tablet   1   . hydrocortisone cream 1 %   Topical   Apply 1 application topically 2 (two) times daily.         . Liniments (SALONPAS ARTHRITIS PAIN RELIEF) PADS   Apply externally   Apply 1 each topically 3 (three) times daily. (every shift)         . metoCLOPramide (REGLAN) 5 MG tablet   Oral   Take 1 tablet (5 mg total) by mouth 4 (four) times daily -  before meals and at bedtime.   40 tablet   0   . polyethylene glycol (MIRALAX / GLYCOLAX) packet   Oral   Take 17 g by mouth daily.   14 each   0   . ranitidine (ZANTAC) 150 MG tablet   Oral   Take 150 mg by mouth 2 (two) times daily.         . ranolazine (RANEXA) 500 MG 12 hr tablet   Oral   Take 1,000 mg by mouth 2 (two) times daily.         Marland Kitchen senna (SENOKOT) 8.6 MG TABS tablet   Oral   Take 1 tablet (8.6 mg total) by mouth daily as needed for mild constipation. Patient taking differently: Take 1 tablet by mouth 2 (two) times daily.    120 each   0     Allergies Ace inhibitors; Ativan; Codeine; Compazine; Losartan; Phenergan; Zofran; Oxycodone; and Tape  Family History  Problem Relation Age of Onset  . Kidney disease Mother   . Diabetes Mother     Social History Social History  Substance Use Topics  . Smoking status: Current Some Day Smoker -- 0.50  packs/day    Types: Cigarettes    Last Attempt to Quit: 02/13/2015  . Smokeless tobacco: Never Used  . Alcohol Use: No    Review of Systems Constitutional: No fever/chills Eyes: No visual changes. ENT: No sore throat. Cardiovascular: Denies chest pain. Respiratory: Denies shortness of breath. Gastrointestinal: Right upper quadrant abdominal pain with severe nausea.   Genitourinary: Negative for dysuria. Musculoskeletal: Negative for back pain. Skin: Negative for rash. Neurological: Negative for headaches, focal weakness or numbness.  10-point ROS otherwise negative.  ____________________________________________   PHYSICAL EXAM:  VITAL SIGNS: ED Triage Vitals  Enc Vitals Group     BP 05/07/15 0854 137/61 mmHg     Pulse Rate 05/07/15 0854 65     Resp 05/07/15 0854 17     Temp 05/07/15 0854 97.6 F (36.4 C)     Temp Source 05/07/15 0854 Oral     SpO2 05/07/15 0854 97 %     Weight  05/07/15 0854 197 lb (89.359 kg)     Height 05/07/15 0854 5\' 5"  (1.651 m)     Head Cir --      Peak Flow --      Pain Score 05/07/15 0855 10     Pain Loc --      Pain Edu? --      Excl. in [place]? --     Constitutional: Alert and oriented.  Upset but does not appear to be in severe distress Eyes: Conjunctivae are normal. PERRL. EOMI. Head: Atraumatic. Nose: No congestion/rhinnorhea. Mouth/Throat: Mucous membranes are moist.  Oropharynx non-erythematous. Neck: No stridor.   Cardiovascular: Normal rate, regular rhythm. Grossly normal heart sounds.  Good peripheral circulation. Respiratory: Normal respiratory effort.  No retractions. Lungs CTAB. Gastrointestinal: Obese, mild distention, Soft but patient points of severe tenderness throughout. No abdominal bruits. No CVA tenderness. Musculoskeletal: Mild bilateral lower extremity edema.  No joint effusions. Neurologic:  Normal speech and language. No gross focal neurologic deficits are appreciated.  Skin:  Skin is warm, dry and intact. No rash  noted.   ____________________________________________   LABS (all labs ordered are listed, but only abnormal results are displayed)  Labs Reviewed  COMPREHENSIVE METABOLIC PANEL - Abnormal; Notable for the following:    Potassium 2.9 (*)    Chloride 97 (*)    CO2 33 (*)    Creatinine, Ser 3.38 (*)    Calcium 8.1 (*)    Total Protein 6.0 (*)    Albumin 2.2 (*)    ALT 11 (*)    Alkaline Phosphatase 128 (*)    GFR calc non Af Amer 14 (*)    GFR calc Af Amer 17 (*)    All other components within normal limits  LIPASE, BLOOD - Abnormal; Notable for the following:    Lipase <10 (*)    All other components within normal limits  CBC WITH DIFFERENTIAL/PLATELET - Abnormal; Notable for the following:    RBC 2.59 (*)    Hemoglobin 7.3 (*)    HCT 23.0 (*)    RDW 20.2 (*)    All other components within normal limits  TROPONIN I  MAGNESIUM  URINALYSIS COMPLETEWITH MICROSCOPIC ([facility] ONLY)   ____________________________________________  EKG  ED ECG REPORT I, Sanyah Molnar, the attending physician, personally viewed and interpreted this ECG.  Date: 05/07/2015 EKG Time: 9:24 Rate: 64 Rhythm: normal sinus rhythm QRS Axis: normal Intervals: Short PR interval, borderline QTC at 501 ms ST/T Wave abnormalities: normal Conduction Disutrbances: none Narrative Interpretation: unremarkable  ____________________________________________  RADIOLOGY  Ct Abdomen Pelvis Wo Contrast  05/07/2015   CLINICAL DATA:  Left-sided abdominal pain during hemodialysis today. Nausea and vomiting for 1 week. Initial encounter.  EXAM: CT ABDOMEN AND PELVIS WITHOUT CONTRAST  TECHNIQUE: Multidetector CT imaging of the abdomen and pelvis was performed following the standard protocol without IV contrast.  COMPARISON:  Radiographs 04/10/2015.  CT 02/22/2015.  FINDINGS: Lower chest: Compared with the prior CT, there are enlarging moderate right-greater-than-left pleural effusions with associated progressive  atelectasis at both lung bases. There is stable mild cardiomegaly and minimal pericardial fluid. Coronary artery atherosclerosis/stents noted. There is generalized chest wall edema, mildly asymmetric to the right.  Hepatobiliary: Unremarkable as imaged in the noncontrast state. No focal abnormality identified. The biliary system appears unchanged status post cholecystectomy.  Pancreas: Mild fatty replacement without apparent focal abnormality.  Spleen: Normal in size without focal abnormality.  Adrenals/Urinary Tract: Both adrenal glands appear normal. Stable chronic bilateral renal atrophy with  cortical thinning. No hydronephrosis or asymmetric perinephric soft tissue stranding. Mild chronic bladder wall thickening.  Stomach/Bowel: Diffuse colonic diverticulosis and mild rectal wall thickening are similar to the prior examination. No significant bowel distension or focal inflammatory change identified.  Vascular/Lymphatic: Small retroperitoneal lymph nodes are stable. Inguinal lymph nodes bilaterally remain mildly enlarged, measuring up to 11 mm short axis on the right (image 88) and 13 mm on the left (image number 91). Atherosclerosis of the aorta, its branches and the iliac arteries appears unchanged.  Reproductive: Hysterectomy.  No evidence of adnexal mass.  Other: There is new ascites with a small amount of fluid around the liver and in the pelvis. There is also generalized soft tissue edema with infiltration of the mesenteric and subcutaneous fat.  Musculoskeletal: No acute or significant osseous findings. 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  IMPRESSION: 1. Compared with the prior CT from 10 weeks ago, there is generalized anasarca with increasing soft tissue edema, bilateral pleural effusions and ascites. 2. No definite acute abdominal pelvic findings. Colonic diverticulosis and distal colonic wall thickening are similar to the prior study but may indicate ongoing mild colitis. 3. Stable  probable reactive inguinal adenopathy.   Electronically Signed   By: Richardean Sale M.D.   On: 05/07/2015 10:26    ____________________________________________   PROCEDURES  Procedure(s) performed: None  Critical Care performed: No ____________________________________________   INITIAL IMPRESSION / ASSESSMENT AND PLAN / ED COURSE  Pertinent labs & imaging results that were available during my care of the patient were reviewed by me and considered in my medical decision making (see chart for details).  The patient's vital signs are normal today and her lab work is generally unremarkable for a hemodialysis patient who finished dialysis early.  I ordered a noncontrast CT scan given her chronic renal disease and her nausea (unlikely to tolerate by mouth contrast).  She does have anasarca compared to prior CT scans is consistent with her abdominal swelling and edema, but this does not appear to be an acute finding.  Her symptoms were completely controlled with 1 dose of morphine and I provided Haldol 2.5 mg IV for an anti-medic because she reports an adverse reaction to Zofran and Phenergan.  These were quite successful in controlling her symptoms and she is currently sleeping.  I spoke by phone with Dr. Holley Raring the nephrologist to discuss anasarca.  He knows the patient well and saw her last week in clinic and observe the anasarca at that time.  He does not feel that she needs inpatient admission and feels that this can be managed as an outpatient with possibly more frequent episodes of dialysis that are slightly smaller volume.  He also pointed out that the patient needs to see GI which she has not done as previously recommended for her chronic abdominal pain and gastroparesis issues.  I will discharge her to go back to dialysis to finish her course of possible given that she remains slightly volume overloaded.  I will give discharge information about following up with Dr. Holley Raring as well as Dr.  Vira Agar.  ____________________________________________  FINAL CLINICAL IMPRESSION(S) / ED DIAGNOSES  Final diagnoses:  Anasarca associated with disorder of kidney  Chronic generalized abdominal pain  Nausea      NEW MEDICATIONS STARTED DURING THIS VISIT:  New Prescriptions   No medications on file     Hinda Kehr, MD 05/07/15 1316

## 2015-05-14 ENCOUNTER — Emergency Department: Payer: Medicare Other

## 2015-05-14 ENCOUNTER — Emergency Department
Admission: EM | Admit: 2015-05-14 | Discharge: 2015-05-14 | Disposition: A | Discharge status: Home / Self Care | Payer: Medicare Other | Attending: Emergency Medicine | Admitting: Emergency Medicine

## 2015-05-14 ENCOUNTER — Encounter: Payer: Self-pay | Admitting: Emergency Medicine

## 2015-05-14 ENCOUNTER — Other Ambulatory Visit: Payer: Self-pay

## 2015-05-14 DIAGNOSIS — Z79899 Other long term (current) drug therapy: Secondary | ICD-10-CM | POA: Diagnosis not present

## 2015-05-14 DIAGNOSIS — Z79811 Long term (current) use of aromatase inhibitors: Secondary | ICD-10-CM | POA: Insufficient documentation

## 2015-05-14 DIAGNOSIS — E1140 Type 2 diabetes mellitus with diabetic neuropathy, unspecified: Secondary | ICD-10-CM | POA: Insufficient documentation

## 2015-05-14 DIAGNOSIS — N186 End stage renal disease: Secondary | ICD-10-CM | POA: Insufficient documentation

## 2015-05-14 DIAGNOSIS — E11649 Type 2 diabetes mellitus with hypoglycemia without coma: Secondary | ICD-10-CM | POA: Insufficient documentation

## 2015-05-14 DIAGNOSIS — Z7901 Long term (current) use of anticoagulants: Secondary | ICD-10-CM | POA: Insufficient documentation

## 2015-05-14 DIAGNOSIS — Z7982 Long term (current) use of aspirin: Secondary | ICD-10-CM | POA: Diagnosis not present

## 2015-05-14 DIAGNOSIS — Z792 Long term (current) use of antibiotics: Secondary | ICD-10-CM | POA: Diagnosis not present

## 2015-05-14 DIAGNOSIS — R1031 Right lower quadrant pain: Secondary | ICD-10-CM | POA: Insufficient documentation

## 2015-05-14 DIAGNOSIS — E1143 Type 2 diabetes mellitus with diabetic autonomic (poly)neuropathy: Secondary | ICD-10-CM | POA: Diagnosis not present

## 2015-05-14 DIAGNOSIS — R079 Chest pain, unspecified: Secondary | ICD-10-CM | POA: Insufficient documentation

## 2015-05-14 DIAGNOSIS — I120 Hypertensive chronic kidney disease with stage 5 chronic kidney disease or end stage renal disease: Secondary | ICD-10-CM | POA: Insufficient documentation

## 2015-05-14 DIAGNOSIS — R1011 Right upper quadrant pain: Secondary | ICD-10-CM

## 2015-05-14 DIAGNOSIS — Z720 Tobacco use: Secondary | ICD-10-CM | POA: Insufficient documentation

## 2015-05-14 DIAGNOSIS — Z992 Dependence on renal dialysis: Secondary | ICD-10-CM | POA: Insufficient documentation

## 2015-05-14 DIAGNOSIS — Z791 Long term (current) use of non-steroidal anti-inflammatories (NSAID): Secondary | ICD-10-CM | POA: Diagnosis not present

## 2015-05-14 LAB — CBC
HCT: 25.5 % — ABNORMAL LOW (ref 35.0–47.0)
Hemoglobin: 8.3 g/dL — ABNORMAL LOW (ref 12.0–16.0)
MCH: 28 pg (ref 26.0–34.0)
MCHC: 32.5 g/dL (ref 32.0–36.0)
MCV: 86.2 fL (ref 80.0–100.0)
Platelets: 178 10*3/uL (ref 150–440)
RBC: 2.96 MIL/uL — ABNORMAL LOW (ref 3.80–5.20)
RDW: 17.9 % — ABNORMAL HIGH (ref 11.5–14.5)
WBC: 4.9 10*3/uL (ref 3.6–11.0)

## 2015-05-14 LAB — COMPREHENSIVE METABOLIC PANEL
ALT: 12 U/L — ABNORMAL LOW (ref 14–54)
AST: 30 U/L (ref 15–41)
Albumin: 2 g/dL — ABNORMAL LOW (ref 3.5–5.0)
Alkaline Phosphatase: 138 U/L — ABNORMAL HIGH (ref 38–126)
Anion gap: 11 (ref 5–15)
BUN: 6 mg/dL (ref 6–20)
CO2: 30 mmol/L (ref 22–32)
Calcium: 7.9 mg/dL — ABNORMAL LOW (ref 8.9–10.3)
Chloride: 98 mmol/L — ABNORMAL LOW (ref 101–111)
Creatinine, Ser: 2.34 mg/dL — ABNORMAL HIGH (ref 0.44–1.00)
GFR calc Af Amer: 26 mL/min — ABNORMAL LOW (ref >60)
GFR calc non Af Amer: 22 mL/min — ABNORMAL LOW (ref >60)
Glucose, Bld: 56 mg/dL — ABNORMAL LOW (ref 65–99)
Potassium: 3.3 mmol/L — ABNORMAL LOW (ref 3.5–5.1)
Sodium: 139 mmol/L (ref 135–145)
Total Bilirubin: 0.6 mg/dL (ref 0.3–1.2)
Total Protein: 5.9 g/dL — ABNORMAL LOW (ref 6.5–8.1)

## 2015-05-14 LAB — LIPASE, BLOOD: Lipase: <10 U/L — ABNORMAL LOW (ref 22–51)

## 2015-05-14 MED ORDER — MORPHINE SULFATE (PF) 4 MG/ML IV SOLN
4.0000 mg | Freq: Once | INTRAVENOUS | Status: AC
  Administered 2015-05-14: 4 mg via INTRAMUSCULAR
  Filled 2015-05-14: qty 1

## 2015-05-14 MED ORDER — HYDROCODONE-ACETAMINOPHEN 5-325 MG PO TABS: 1.0000 | ORAL_TABLET | Freq: Four times a day (QID) | ORAL | Status: DC | PRN

## 2015-05-14 MED ORDER — ONDANSETRON 4 MG PO TBDP: 4.0000 mg | ORAL_TABLET | Freq: Once | ORAL | Status: DC

## 2015-05-14 MED ORDER — HYDROMORPHONE HCL 1 MG/ML IJ SOLN: 1.0000 mg | Freq: Once | INTRAMUSCULAR | Status: DC

## 2015-05-14 MED ORDER — HALOPERIDOL LACTATE 5 MG/ML IJ SOLN
5.0000 mg | Freq: Once | INTRAMUSCULAR | Status: AC
  Administered 2015-05-14: 5 mg via INTRAMUSCULAR
  Filled 2015-05-14: qty 1

## 2015-05-14 MED ORDER — GI COCKTAIL ~~LOC~~
30.0000 mL | Freq: Once | ORAL | Status: AC
  Administered 2015-05-14: 30 mL via ORAL
  Filled 2015-05-14: qty 30

## 2015-05-14 NOTE — ED Notes (Signed)
Ems pt from dialysis (fistula remains accessed to left upper arm), pt complaining of mid sternal chest pain starting at dialysis, RUQ  abd pain x2 days, increase pain on palpation

## 2015-05-14 NOTE — Discharge Instructions (Signed)
Abdominal Pain Many things can cause abdominal pain. Usually, abdominal pain is not caused by a disease and will improve without treatment. It can often be observed and treated at home. Your health care provider will do a physical exam and possibly order blood tests and X-rays to help determine the seriousness of your pain. However, in many cases, more time must pass before a clear cause of the pain can be found. Before that point, your health care provider may not know if you need more testing or further treatment. HOME CARE INSTRUCTIONS  Monitor your abdominal pain for any changes. The following actions may help to alleviate any discomfort you are experiencing:  Only take over-the-counter or prescription medicines as directed by your health care provider.  Do not take laxatives unless directed to do so by your health care provider.  Try a clear liquid diet (broth, tea, or water) as directed by your health care provider. Slowly move to a bland diet as tolerated. SEEK MEDICAL CARE IF:  You have unexplained abdominal pain.  You have abdominal pain associated with nausea or diarrhea.  You have pain when you urinate or have a bowel movement.  You experience abdominal pain that wakes you in the night.  You have abdominal pain that is worsened or improved by eating food.  You have abdominal pain that is worsened with eating fatty foods.  You have a fever. SEEK IMMEDIATE MEDICAL CARE IF:   Your pain does not go away within 2 hours.  You keep throwing up (vomiting).  Your pain is felt only in portions of the abdomen, such as the right side or the left lower portion of the abdomen.  You pass bloody or black tarry stools. MAKE SURE YOU:  Understand these instructions.   Will watch your condition.   Will get help right away if you are not doing well or get worse.  Document Released: 06/18/2005 Document Revised: 09/13/2013 Document Reviewed: 05/18/2013 [facility] Patient Information  2015 [place], [place]. This information is not intended to replace advice given to you by your health care provider. Make sure you discuss any questions you have with your health care provider.   Please return immediately if condition worsens. Please contact her primary physician or the physician you were given for referral. If you have any specialist physicians involved in her treatment and plan please also contact them. Thank you for using [facility] emergency Department.

## 2015-05-14 NOTE — ED Provider Notes (Signed)
Time Seen: Approximately.[facility]   I have reviewed the triage notes  Chief Complaint: Chest Pain and Abdominal Pain   History of Present Illness: [name] is a 54 y.o. female who presents emergency Department with right upper quadrant abdominal pain. Review of the records shows this seems to be a chronic issue for the patient. She was just recently seen here a week ago and had an abdominal pelvic CT which showed some ascites, but otherwise no obvious acute findings. She has a known history of pancreatitis and hepatitis B. Patient states that she was at dialysis today and she had stated the abdominal pain and he referred her again here for evaluation for abdominal pain. He is not aware of any fever. She states she has had some nausea but no persistent vomiting. No loose stool or diarrhea. No melena or hematochezia. She receives dialysis Monday Wednesdays and Fridays.   Past Medical History  Diagnosis Date  . Asthma   . Collagen vascular disease   . Diabetes mellitus without complication   . Hypertension   . Coronary artery disease     a. cath 2013: stenting to RCA (report not available); b. cath 2014: LM nl, pLAD 40%, mLAD nl, ost LCx 40%, mid LCx nl, pRCA 30% @ site of prior stent, mRCA 50%  . Diabetic neuropathy   . ESRD (end stage renal disease) on dialysis     M-W-F  . GERD (gastroesophageal reflux disease)   . Hx of pancreatitis 2015  . Myocardial infarction   . Pneumonia   . Mitral regurgitation     a. echo 10/2013: EF 62%, noWMA, mildly dilated LA, mild to mod MR/TR, GR1DD  . COPD (chronic obstructive pulmonary disease)   . Acute diastolic CHF (congestive heart failure) 04/05/2015  . dialysis 2006    Patient Active Problem List   Diagnosis Date Noted  . Hypoglycemia 04/24/2015  . Unresponsiveness 04/24/2015  . Bradycardia 04/24/2015  . Hypothermia 04/24/2015  . Acute respiratory failure 04/24/2015  . Acute diastolic CHF (congestive heart failure)  04/05/2015  . Diabetic gastroparesis 04/05/2015  . Hypokalemia 04/05/2015  . Generalized weakness 04/05/2015  . Acute pulmonary edema 04/03/2015  . Nausea and vomiting 04/03/2015  . Hypoglycemia associated with diabetes 04/03/2015  . Anemia of chronic disease 04/03/2015  . Secondary hyperparathyroidism 04/03/2015  . Pressure ulcer 04/02/2015  . Acute respiratory failure with hypoxia 04/01/2015  . Adjustment disorder with anxiety 03/14/2015  . Somatic symptom disorder, mild 03/08/2015  . Coronary artery disease involving native coronary artery of native heart without angina pectoris   . Nausea & vomiting 03/06/2015  . Abdominal pain 03/06/2015  . DM (diabetes mellitus) 03/06/2015  . HTN (hypertension) 03/06/2015  . Gastroparesis 02/24/2015  . Pleural effusion 02/19/2015  . HCAP (healthcare-associated pneumonia) 02/19/2015  . End-stage renal disease on hemodialysis 02/19/2015    Past Surgical History  Procedure Laterality Date  . Cholecystectomy    . Appendectomy    . Abdominal hysterectomy    . Dialysis fistula creation Left     upper arm  . Esophagogastroduodenoscopy N/A 03/08/2015    Procedure: ESOPHAGOGASTRODUODENOSCOPY (EGD);  Surgeon: Manya Silvas, MD;  Location: [facility] ENDOSCOPY;  Service: Endoscopy;  Laterality: N/A;    Past Surgical History  Procedure Laterality Date  . Cholecystectomy    . Appendectomy    . Abdominal hysterectomy    . Dialysis fistula creation Left     upper arm  . Esophagogastroduodenoscopy N/A 03/08/2015    Procedure: ESOPHAGOGASTRODUODENOSCOPY (EGD);  Surgeon: Manya Silvas, MD;  Location: [facility] ENDOSCOPY;  Service: Endoscopy;  Laterality: N/A;    Current Outpatient Rx  Name  Route  Sig  Dispense  Refill  . albuterol (PROVENTIL HFA;VENTOLIN HFA) 108 (90 BASE) MCG/ACT inhaler      Inhale 4-6 puffs by mouth every 4 hours as needed for wheezing, cough, and/or shortness of breath Patient taking differently: Inhale 4 puffs into the lungs  every 4 (four) hours as needed for wheezing or shortness of breath.    1 Inhaler   1   . ALPRAZolam (XANAX) 0.25 MG tablet   Oral   Take 1 tablet by mouth 2 (two) times daily as needed.       0   . amLODipine (NORVASC) 10 MG tablet   Oral   Take 10 mg by mouth daily.         Marland Kitchen aspirin EC 81 MG tablet   Oral   Take 81 mg by mouth daily.         Marland Kitchen atorvastatin (LIPITOR) 40 MG tablet   Oral   Take 40 mg by mouth at bedtime.          . carvedilol (COREG) 25 MG tablet   Oral   Take 25 mg by mouth 2 (two) times daily.         . cetirizine (ZYRTEC) 10 MG tablet   Oral   Take 10 mg by mouth daily.         . citalopram (CELEXA) 20 MG tablet   Oral   Take 1 tablet by mouth daily with supper.         . clopidogrel (PLAVIX) 75 MG tablet   Oral   Take 75 mg by mouth daily.         . diazepam (VALIUM) 5 MG tablet   Oral   Take 1 tablet (5 mg total) by mouth every 8 (eight) hours as needed for muscle spasms.   20 tablet   0   . docusate sodium (COLACE) 100 MG capsule   Oral   Take 1 capsule (100 mg total) by mouth 2 (two) times daily.   10 capsule   0   . hydrALAZINE (APRESOLINE) 100 MG tablet   Oral   Take 1 tablet (100 mg total) by mouth 3 (three) times daily.   90 tablet   1   . HYDROcodone-acetaminophen (NORCO/VICODIN) 5-325 MG per tablet   Oral   Take 1 tablet by mouth every 6 (six) hours as needed for moderate pain.   20 tablet   0   . hydrocortisone cream 1 %   Topical   Apply 1 application topically 2 (two) times daily.         . hydrOXYzine (VISTARIL) 25 MG capsule   Oral   Take 1 capsule by mouth every 6 (six) hours as needed.      3   . levofloxacin (LEVAQUIN) 500 MG tablet   Oral   Take 500 mg by mouth every other day.         . Liniments (SALONPAS ARTHRITIS PAIN RELIEF) PADS   Apply externally   Apply 1 each topically 3 (three) times daily. (every shift)         . lipase/protease/amylase (CREON) 12000 UNITS CPEP  capsule   Oral   Take 36,000 Units by mouth 3 (three) times daily with meals. 3 capsules 3 times daily with meals and 1 capsules  with snacks         .  meclizine (ANTIVERT) 25 MG tablet   Oral   Take 1 tablet by mouth 3 (three) times daily as needed.      4   . metoCLOPramide (REGLAN) 5 MG tablet   Oral   Take 1 tablet (5 mg total) by mouth 4 (four) times daily -  before meals and at bedtime.   40 tablet   0   . nitroGLYCERIN (NITROSTAT) 0.4 MG SL tablet   Sublingual   Place 0.4 mg under the tongue every 5 (five) minutes x 3 doses as needed for chest pain.          Marland Kitchen nystatin (MYCOSTATIN) 100000 UNIT/ML suspension   Oral   Take 20 mLs by mouth daily.         . Omega-3 Fatty Acids (FISH OIL) 1000 MG CAPS   Oral   Take 1 capsule by mouth 2 (two) times daily.         . pantoprazole (PROTONIX) 40 MG tablet   Oral   Take 1 tablet (40 mg total) by mouth 2 (two) times daily before a meal.   60 tablet   0   . polyethylene glycol (MIRALAX / GLYCOLAX) packet   Oral   Take 17 g by mouth daily.   14 each   0   . promethazine (PHENERGAN) 12.5 MG tablet   Oral   Take 12.5 mg by mouth every 6 (six) hours as needed for nausea or vomiting.         . ranitidine (ZANTAC) 150 MG tablet   Oral   Take 150 mg by mouth 2 (two) times daily.         . ranolazine (RANEXA) 500 MG 12 hr tablet   Oral   Take 1,000 mg by mouth 2 (two) times daily.         Marland Kitchen senna (SENOKOT) 8.6 MG TABS tablet   Oral   Take 1 tablet (8.6 mg total) by mouth daily as needed for mild constipation. Patient taking differently: Take 1 tablet by mouth 2 (two) times daily.    120 each   0   . sucralfate (CARAFATE) 1 G tablet   Oral   Take 1 tablet (1 g total) by mouth 4 (four) times daily -  with meals and at bedtime.   120 tablet   0   . Vitamin D, Cholecalciferol, 400 UNITS TABS   Oral   Take 400 Units by mouth 2 (two) times daily.            Allergies:  Ace inhibitors; Ativan;  Codeine; Compazine; Losartan; Phenergan; Zofran; Oxycodone; and Tape  Family History: Family History  Problem Relation Age of Onset  . Kidney disease Mother   . Diabetes Mother     Social History: Social History  Substance Use Topics  . Smoking status: Current Some Day Smoker -- 0.50 packs/day    Types: Cigarettes    Last Attempt to Quit: 02/13/2015  . Smokeless tobacco: Never Used  . Alcohol Use: No     Review of Systems:   10 point review of systems was performed and was otherwise negative:  Constitutional: No fever Eyes: No visual disturbances ENT: No sore throat, ear pain Cardiac: No chest pain Respiratory: No shortness of breath, wheezing, or stridor Abdomen: No abdominal pain, no vomiting, No diarrhea Endocrine: No weight loss, No night sweats Extremities: No peripheral edema, cyanosis Skin: No rashes, easy bruising Neurologic: No focal weakness, trouble with speech or swollowing Urologic: Patient  is auric   Physical Exam:  ED Triage Vitals  Enc Vitals Group     BP 05/14/15 0943 145/45 mmHg     Pulse Rate 05/14/15 0943 74     Resp 05/14/15 0943 18     Temp 05/14/15 0943 97.8 F (36.6 C)     Temp Source 05/14/15 0943 Oral     SpO2 05/14/15 0943 99 %     Weight 05/14/15 0943 197 lb (89.359 kg)     Height 05/14/15 0943 5' 5.5" (1.664 m)     Head Cir --      Peak Flow --      Pain Score 05/14/15 0944 9     Pain Loc --      Pain Edu? --      Excl. in [place]? --     General: Awake , Alert , and Oriented times 3; GCS 15 Head: Normal cephalic , atraumatic Eyes: Pupils equal , round, reactive to light Nose/Throat: No nasal drainage, patent upper airway without erythema or exudate.  Neck: Supple, Full range of motion, No anterior adenopathy or palpable thyroid masses Lungs: Clear to ascultation without wheezes , rhonchi, or rales Heart: Regular rate, regular rhythm without murmurs , gallops , or rubs Abdomen diffuse tenderness across the right upper and right  lower abdominal region. No rebound guarding or rigidity or peritoneal signs are noted. Bowel sounds are positive and symmetric in all 4 quadrants.     Extremities: 2 plus symmetric pulses. No edema, clubbing or cyanosis Neurologic: normal ambulation, Motor symmetric without deficits, sensory intact Skin: warm, dry, no rashes   Labs:   All laboratory work was reviewed including any pertinent negatives or positives listed below:  Labs Reviewed  [place], BLOOD   laboratory work was reviewed without any significant abnormalities. She has her typical elevated creatinine and we would expect with a renal failure patient and her potassium is slightly low at 3.3.  EKG: EKG: Normal sinus rhythm with a short PR interval Low-voltage QRS Nonspecific T wave abnormality No acute ischemic changes are noted Rate 75 PR108 ms QRS 90 ms QT interval 452 ms   Radiology:  I personally reviewed the radiologic studies   Procedures: Patient had removal of the IV access that she had for dialysis.   Critical Care: None  EXAM: DG ABDOMEN ACUTE W/ 1V CHEST  COMPARISON: CT 05/07/2015. Chest x-ray 04/24/2015.  FINDINGS: Interval extubation. There are moderate layering bilateral pleural effusions. Bibasilar airspace opacities could reflect atelectasis or edema. Heart is borderline in size. No acute bony abnormality.  Nonobstructive bowel gas pattern. No free air organomegaly. Prior cholecystectomy. No suspicious calcification.  IMPRESSION: Moderate layering bilateral effusions with bilateral lower lobe atelectasis or edema. Borderline heart size.  No evidence of bowel obstruction or free air.   ED Course:  Patient's stay here was uneventful. She received Haldol once again with symptomatic relief. She does not exhibit any clinical signs or radiologic signs of a small bowel obstruction. She has had previous cholecystectomy and appendectomy. I did not suspect  any other surgical abnormality. Review of her records shows that this pain is occurring at least since last week ago. She had essentially a negative abdominal pelvic CT last week. Patient has a known history of hepatitis B but her liver functions are overall within normal limits.    Assessment:  Chronic right-sided abdominal pain History of renal failure with dialysis Monday Wednesdays and Fridays   Final Clinical  Impression: Acute exacerbation of chronic right upper quadrant abdominal pain Final diagnoses:  None     Plan: Patient was discharged on pain medications. She's been advised to follow-up with her dialysis tomorrow since she missed her treatment today or she can likely since she is not hypoxic and her creatinine level was not significantly abnormal, she could wait till Wednesday for her normal scheduled dialysis. She's been advised discuss with her nephrologist and also her gastroenterologist and urologist the persistent nature of her abdominal pain.            Daymon Larsen, MD 05/14/15 [phone]

## 2015-05-14 NOTE — ED Notes (Signed)
Patient gone for testing,

## 2015-05-14 NOTE — ED Notes (Signed)
2 attempts for access without success.

## 2015-05-14 NOTE — ED Notes (Signed)
Patient has returned from CCU. 1 attempt made to contact son for discharge. Left message on voicemail.

## 2015-05-14 NOTE — ED Notes (Signed)
Patient left unit via wheelchair and with ED medic to be transported to CCU so that the dialysis tech can deaccess her dialysis fistula.

## 2015-05-20 ENCOUNTER — Encounter: Payer: Self-pay | Admitting: *Deleted

## 2015-05-20 ENCOUNTER — Emergency Department
Admission: EM | Admit: 2015-05-20 | Discharge: 2015-05-20 | Disposition: A | Discharge status: Home / Self Care | Payer: Medicare Other | Source: Home / Self Care | Attending: Emergency Medicine | Admitting: Emergency Medicine

## 2015-05-20 DIAGNOSIS — R1010 Upper abdominal pain, unspecified: Secondary | ICD-10-CM

## 2015-05-20 DIAGNOSIS — J189 Pneumonia, unspecified organism: Secondary | ICD-10-CM | POA: Diagnosis not present

## 2015-05-20 LAB — CBC WITH DIFFERENTIAL/PLATELET
Band Neutrophils: 1 % (ref 0–10)
Basophils Absolute: 0 10*3/uL (ref 0.0–0.1)
Basophils Relative: 1 % (ref 0–1)
Blasts: 0 %
Eosinophils Absolute: 0.1 10*3/uL (ref 0.0–0.7)
Eosinophils Relative: 2 % (ref 0–5)
HCT: 27 % — ABNORMAL LOW (ref 35.0–47.0)
Hemoglobin: 8.9 g/dL — ABNORMAL LOW (ref 12.0–16.0)
Lymphocytes Relative: 36 % (ref 12–46)
Lymphs Abs: 1.6 10*3/uL (ref 0.7–4.0)
MCH: 28.2 pg (ref 26.0–34.0)
MCHC: 32.8 g/dL (ref 32.0–36.0)
MCV: 86.2 fL (ref 80.0–100.0)
Metamyelocytes Relative: 0 %
Monocytes Absolute: 0.5 10*3/uL (ref 0.1–1.0)
Monocytes Relative: 10 % (ref 3–12)
Myelocytes: 0 %
Neutro Abs: 2.3 10*3/uL (ref 1.7–7.7)
Neutrophils Relative %: 50 % (ref 43–77)
Other: 0 %
Platelets: 239 10*3/uL (ref 150–440)
Promyelocytes Absolute: 0 %
RBC: 3.14 MIL/uL — ABNORMAL LOW (ref 3.80–5.20)
RDW: 18.2 % — ABNORMAL HIGH (ref 11.5–14.5)
WBC: 4.5 10*3/uL (ref 3.6–11.0)
nRBC: 0 /100 WBC

## 2015-05-20 LAB — COMPREHENSIVE METABOLIC PANEL
ALT: 14 U/L (ref 14–54)
AST: 24 U/L (ref 15–41)
Albumin: 2.1 g/dL — ABNORMAL LOW (ref 3.5–5.0)
Alkaline Phosphatase: 149 U/L — ABNORMAL HIGH (ref 38–126)
Anion gap: 12 (ref 5–15)
BUN: 6 mg/dL (ref 6–20)
CO2: 30 mmol/L (ref 22–32)
Calcium: 8.5 mg/dL — ABNORMAL LOW (ref 8.9–10.3)
Chloride: 97 mmol/L — ABNORMAL LOW (ref 101–111)
Creatinine, Ser: 3.62 mg/dL — ABNORMAL HIGH (ref 0.44–1.00)
GFR calc Af Amer: 15 mL/min — ABNORMAL LOW (ref >60)
GFR calc non Af Amer: 13 mL/min — ABNORMAL LOW (ref >60)
Glucose, Bld: 72 mg/dL (ref 65–99)
Potassium: 2.8 mmol/L — CL (ref 3.5–5.1)
Sodium: 139 mmol/L (ref 135–145)
Total Bilirubin: 1 mg/dL (ref 0.3–1.2)
Total Protein: 6.3 g/dL — ABNORMAL LOW (ref 6.5–8.1)

## 2015-05-20 LAB — LIPASE, BLOOD: Lipase: <10 U/L — ABNORMAL LOW (ref 22–51)

## 2015-05-20 LAB — TROPONIN I: Troponin I: <0.03 ng/mL (ref <0.031)

## 2015-05-20 MED ORDER — HALOPERIDOL LACTATE 5 MG/ML IJ SOLN
2.0000 mg | Freq: Once | INTRAMUSCULAR | Status: AC
  Administered 2015-05-20: 2 mg via INTRAVENOUS
  Filled 2015-05-20: qty 1

## 2015-05-20 MED ORDER — SODIUM CHLORIDE 0.9 % IV BOLUS (SEPSIS)
1000.0000 mL | Freq: Once | INTRAVENOUS | Status: AC
  Administered 2015-05-20: 1000 mL via INTRAVENOUS

## 2015-05-20 MED ORDER — MORPHINE SULFATE (PF) 4 MG/ML IV SOLN
4.0000 mg | Freq: Once | INTRAVENOUS | Status: AC
  Administered 2015-05-20: 4 mg via INTRAVENOUS
  Filled 2015-05-20: qty 1

## 2015-05-20 MED ORDER — METOCLOPRAMIDE HCL 5 MG/ML IJ SOLN
10.0000 mg | Freq: Once | INTRAMUSCULAR | Status: AC
  Administered 2015-05-20: 10 mg via INTRAVENOUS
  Filled 2015-05-20: qty 2

## 2015-05-20 NOTE — ED Notes (Signed)
Sleeping.  Easy to wake. Skin warm and dry.  NAD.

## 2015-05-20 NOTE — ED Notes (Signed)
Pt here last Monday or tueasday for same, pt is a M-W-F dialysis pt, c/o N/V, at last visit Dx with pneumonia, pt is taking Abx at this time for this.

## 2015-05-20 NOTE — ED Notes (Signed)
Resting.  NAD.  Skin warm and dry.  States pain is improved 6/10.  Continue to monitor.

## 2015-05-20 NOTE — Discharge Instructions (Signed)

## 2015-05-20 NOTE — ED Notes (Signed)
REady for discharge.  Patient's son called for ride home, will be here in 20 minutes.  Patient resting currently.  Will discharge when ride is here.

## 2015-05-20 NOTE — ED Provider Notes (Signed)
[facility] Emergency Department Provider Note  Time seen: 9:54 AM  I have reviewed the triage vital signs and the nursing notes.   HISTORY  Chief Complaint Nausea and Emesis    HPI [name] is a 54 y.o. female with a past medical history of asthma, diabetes, hypertension, end-stage renal disease on dialysis Monday/Wednesday/Friday, MI, pancreatitis, diabetes, CHF, COPD who presents the emergency department with continued epigastric to right upper quadrant abdominal pain. Patient states this episode has been ongoing for 3-4 weeks at this time. The patient has been seen in the emergency department multiple times for the same discomfort. She states the discomfort comes and goes and flares but is always present. She has seen Dr. Vira Agar in the past, has been diagnosed with gastroparesis as well as gastritis. Denies alcohol use. Denies NSAID use. Patient states nausea, has been having trouble eating due to her nausea. Occasionally will vomit. Denies diarrhea, normal bowel movement yesterday. Denies black or bloody stool or vomit. Describes her abdominal pain is 10/10 and located in the right upper quadrant.     Past Medical History  Diagnosis Date  . Asthma   . Collagen vascular disease   . Diabetes mellitus without complication   . Hypertension   . Coronary artery disease     a. cath 2013: stenting to RCA (report not available); b. cath 2014: LM nl, pLAD 40%, mLAD nl, ost LCx 40%, mid LCx nl, pRCA 30% @ site of prior stent, mRCA 50%  . Diabetic neuropathy   . ESRD (end stage renal disease) on dialysis     M-W-F  . GERD (gastroesophageal reflux disease)   . Hx of pancreatitis 2015  . Myocardial infarction   . Pneumonia   . Mitral regurgitation     a. echo 10/2013: EF 62%, noWMA, mildly dilated LA, mild to mod MR/TR, GR1DD  . COPD (chronic obstructive pulmonary disease)   . Acute diastolic CHF (congestive heart failure) 04/05/2015  . dialysis 2006     Patient Active Problem List   Diagnosis Date Noted  . Hypoglycemia 04/24/2015  . Unresponsiveness 04/24/2015  . Bradycardia 04/24/2015  . Hypothermia 04/24/2015  . Acute respiratory failure 04/24/2015  . Acute diastolic CHF (congestive heart failure) 04/05/2015  . Diabetic gastroparesis 04/05/2015  . Hypokalemia 04/05/2015  . Generalized weakness 04/05/2015  . Acute pulmonary edema 04/03/2015  . Nausea and vomiting 04/03/2015  . Hypoglycemia associated with diabetes 04/03/2015  . Anemia of chronic disease 04/03/2015  . Secondary hyperparathyroidism 04/03/2015  . Pressure ulcer 04/02/2015  . Acute respiratory failure with hypoxia 04/01/2015  . Adjustment disorder with anxiety 03/14/2015  . Somatic symptom disorder, mild 03/08/2015  . Coronary artery disease involving native coronary artery of native heart without angina pectoris   . Nausea & vomiting 03/06/2015  . Abdominal pain 03/06/2015  . DM (diabetes mellitus) 03/06/2015  . HTN (hypertension) 03/06/2015  . Gastroparesis 02/24/2015  . Pleural effusion 02/19/2015  . HCAP (healthcare-associated pneumonia) 02/19/2015  . End-stage renal disease on hemodialysis 02/19/2015    Past Surgical History  Procedure Laterality Date  . Cholecystectomy    . Appendectomy    . Abdominal hysterectomy    . Dialysis fistula creation Left     upper arm  . Esophagogastroduodenoscopy N/A 03/08/2015    Procedure: ESOPHAGOGASTRODUODENOSCOPY (EGD);  Surgeon: Manya Silvas, MD;  Location: [facility] ENDOSCOPY;  Service: Endoscopy;  Laterality: N/A;    Current Outpatient Rx  Name  Route  Sig  Dispense  Refill  .  albuterol (PROVENTIL HFA;VENTOLIN HFA) 108 (90 BASE) MCG/ACT inhaler      Inhale 4-6 puffs by mouth every 4 hours as needed for wheezing, cough, and/or shortness of breath Patient taking differently: Inhale 4 puffs into the lungs every 4 (four) hours as needed for wheezing or shortness of breath.    1 Inhaler   1   . amLODipine  (NORVASC) 10 MG tablet   Oral   Take 10 mg by mouth daily.         Marland Kitchen aspirin EC 81 MG tablet   Oral   Take 81 mg by mouth daily.         . B Complex-C-Biotin-E-FA (RENATABS PO)   Oral   Take 1 tablet by mouth daily.         . cetirizine (ZYRTEC) 10 MG tablet   Oral   Take 10 mg by mouth daily.         . citalopram (CELEXA) 20 MG tablet   Oral   Take 1 tablet by mouth daily with supper.         . clopidogrel (PLAVIX) 75 MG tablet   Oral   Take 75 mg by mouth daily.         . diazepam (VALIUM) 5 MG tablet   Oral   Take 1 tablet (5 mg total) by mouth every 8 (eight) hours as needed for muscle spasms.   20 tablet   0   . docusate sodium (COLACE) 100 MG capsule   Oral   Take 1 capsule (100 mg total) by mouth 2 (two) times daily.   10 capsule   0   . HYDROcodone-acetaminophen (NORCO) 5-325 MG per tablet   Oral   Take 1 tablet by mouth every 6 (six) hours as needed for moderate pain.   20 tablet   0   . insulin aspart (NOVOLOG) 100 UNIT/ML injection   Subcutaneous   Inject 2-10 Units into the skin 3 (three) times daily before meals. 151-200 2 units, 201-250 4 units, 251-300 6 units, 301-350 8 units, 351-401 or higher 10 units and notify MD.         . insulin detemir (LEVEMIR) 100 UNIT/ML injection   Subcutaneous   Inject 8 Units into the skin at bedtime.          . lipase/protease/amylase (CREON) 12000 UNITS CPEP capsule   Oral   Take 36,000 Units by mouth 3 (three) times daily with meals. 3 capsules 3 times daily with meals and 1 capsules  with snacks         . meclizine (ANTIVERT) 25 MG tablet   Oral   Take 1 tablet by mouth 3 (three) times daily as needed.      4   . metoCLOPramide (REGLAN) 5 MG tablet   Oral   Take 1 tablet (5 mg total) by mouth 4 (four) times daily -  before meals and at bedtime. Patient taking differently: Take 5 mg by mouth 3 (three) times daily.    40 tablet   0   . metoprolol (LOPRESSOR) 50 MG tablet   Oral    Take 50 mg by mouth 2 (two) times daily.         . nitroGLYCERIN (NITROSTAT) 0.4 MG SL tablet   Sublingual   Place 0.4 mg under the tongue every 5 (five) minutes x 3 doses as needed for chest pain.          . Omega-3 Fatty Acids (FISH OIL)  1000 MG CAPS   Oral   Take 1 capsule by mouth 2 (two) times daily.         . ondansetron (ZOFRAN) 4 MG tablet   Oral   Take 4 mg by mouth every 8 (eight) hours as needed for nausea or vomiting.         . pantoprazole (PROTONIX) 40 MG tablet   Oral   Take 1 tablet (40 mg total) by mouth 2 (two) times daily before a meal.   60 tablet   0   . polyethylene glycol (MIRALAX / GLYCOLAX) packet   Oral   Take 17 g by mouth daily.   14 each   0   . ranitidine (ZANTAC) 150 MG tablet   Oral   Take 150 mg by mouth 2 (two) times daily.         Marland Kitchen senna (SENOKOT) 8.6 MG TABS tablet   Oral   Take 1 tablet (8.6 mg total) by mouth daily as needed for mild constipation. Patient taking differently: Take 1 tablet by mouth 2 (two) times daily.    120 each   0   . sevelamer carbonate (RENVELA) 800 MG tablet   Oral   Take 1,600 mg by mouth 3 (three) times daily with meals.         . sucralfate (CARAFATE) 1 G tablet   Oral   Take 1 tablet (1 g total) by mouth 4 (four) times daily -  with meals and at bedtime.   120 tablet   0   . Vitamin D, Cholecalciferol, 400 UNITS TABS   Oral   Take 400 Units by mouth 2 (two) times daily.            Allergies Ace inhibitors; Ativan; Codeine; Compazine; Losartan; Phenergan; Zofran; Oxycodone; and Tape  Family History  Problem Relation Age of Onset  . Kidney disease Mother   . Diabetes Mother     Social History Social History  Substance Use Topics  . Smoking status: Current Some Day Smoker -- 0.50 packs/day    Types: Cigarettes    Last Attempt to Quit: 02/13/2015  . Smokeless tobacco: Never Used  . Alcohol Use: No    Review of Systems Constitutional: Negative for fever Cardiovascular:  Negative for chest pain. Respiratory: Negative for shortness of breath. Gastrointestinal: Right upper quadrant abdominal pain. Positive for nausea. Negative for vomiting or diarrhea. Genitourinary: Makes a small amount of urine daily, no dysuria. Neurological: Negative for headache 10-point ROS otherwise negative.  ____________________________________________   PHYSICAL EXAM:  Constitutional: Alert and oriented. Mild distress due to pain and nausea. Eyes: Normal exam ENT   Mouth/Throat: Dry mucous membranes Cardiovascular: Normal rate, regular rhythm. No murmur Respiratory: Normal respiratory effort without tachypnea nor retractions. Breath sounds are clear and equal bilaterally. No wheezes/rales/rhonchi. Gastrointestinal: Soft, moderate right upper quadrant epigastric tenderness palpation. No rebound or guarding. No distention. Musculoskeletal: Nontender with normal range of motion in all extremities.  Neurologic:  Normal speech and language. No gross focal neurologic deficits  Skin:  Skin is warm, dry and intact.  Psychiatric: Mood and affect are normal. Tearful due to pain. ____________________________________________    INITIAL IMPRESSION / ASSESSMENT AND PLAN / ED COURSE  Pertinent labs & imaging results that were available during my care of the patient were reviewed by me and considered in my medical decision making (see chart for details).  Patient presents for continued right upper quadrant epigastric abdominal pain. She states it feels similar to  a gastritis/gastroparesis which she has experienced in the past. Patient was seen on 05/07/15 with a normal abdominal CT scan. Patient was seen 05/14/15 with a normal 3-way abdominal x-ray. Patient states the pain is the same as it wasn't with 2 prior visits, but had improved while in the emergency department and then worsened again at home. Denies fever, vomiting, diarrhea or constipation. Does feel very nauseated and states it has  been difficult for her to eat due to nausea. Mild to moderate distress at this time due to pain and nausea. We will check labs, treat pain and nausea while in the emergency department. Patient states she usually improves with morphine and Haldol. She states Haldol is the only thing that works for her nausea. We will also does Reglan given the likelihood of gastroparesis. We'll monitor the patient closely in the emergency department while awaiting lab results. At this time I do not feel the patient would benefit from additional CT scan, however once lab results are known we will revisit imaging.  Labs are largely at the patient's baseline. Patient states her pain is much improved following medication. We will discharge patient home with GI follow-up tomorrow. Patient agreeable to plan.  ____________________________________________   FINAL CLINICAL IMPRESSION(S) / ED DIAGNOSES  Upper abdominal pain   Harvest Dark, MD 05/20/15 1326

## 2015-05-20 NOTE — ED Notes (Signed)
AAOx3.  Skin warm and dry.  NAD.  D/C home 

## 2015-05-21 ENCOUNTER — Emergency Department: Payer: Medicare Other

## 2015-05-21 ENCOUNTER — Encounter: Payer: Self-pay | Admitting: Emergency Medicine

## 2015-05-21 ENCOUNTER — Inpatient Hospital Stay
Admission: EM | Admit: 2015-05-21 | Discharge: 2015-05-24 | DRG: 193 | Disposition: A | Discharge status: Home / Self Care | Payer: Medicare Other | Attending: Internal Medicine | Admitting: Internal Medicine

## 2015-05-21 DIAGNOSIS — Z992 Dependence on renal dialysis: Secondary | ICD-10-CM | POA: Diagnosis not present

## 2015-05-21 DIAGNOSIS — E1143 Type 2 diabetes mellitus with diabetic autonomic (poly)neuropathy: Secondary | ICD-10-CM | POA: Diagnosis present

## 2015-05-21 DIAGNOSIS — Z8701 Personal history of pneumonia (recurrent): Secondary | ICD-10-CM

## 2015-05-21 DIAGNOSIS — K861 Other chronic pancreatitis: Secondary | ICD-10-CM | POA: Diagnosis present

## 2015-05-21 DIAGNOSIS — N2581 Secondary hyperparathyroidism of renal origin: Secondary | ICD-10-CM | POA: Diagnosis present

## 2015-05-21 DIAGNOSIS — Z993 Dependence on wheelchair: Secondary | ICD-10-CM | POA: Diagnosis not present

## 2015-05-21 DIAGNOSIS — I120 Hypertensive chronic kidney disease with stage 5 chronic kidney disease or end stage renal disease: Secondary | ICD-10-CM | POA: Diagnosis present

## 2015-05-21 DIAGNOSIS — E876 Hypokalemia: Secondary | ICD-10-CM | POA: Diagnosis present

## 2015-05-21 DIAGNOSIS — Z7982 Long term (current) use of aspirin: Secondary | ICD-10-CM

## 2015-05-21 DIAGNOSIS — K219 Gastro-esophageal reflux disease without esophagitis: Secondary | ICD-10-CM | POA: Diagnosis present

## 2015-05-21 DIAGNOSIS — I252 Old myocardial infarction: Secondary | ICD-10-CM

## 2015-05-21 DIAGNOSIS — Z7902 Long term (current) use of antithrombotics/antiplatelets: Secondary | ICD-10-CM

## 2015-05-21 DIAGNOSIS — J45909 Unspecified asthma, uncomplicated: Secondary | ICD-10-CM | POA: Diagnosis present

## 2015-05-21 DIAGNOSIS — J189 Pneumonia, unspecified organism: Principal | ICD-10-CM | POA: Diagnosis present

## 2015-05-21 DIAGNOSIS — Z833 Family history of diabetes mellitus: Secondary | ICD-10-CM

## 2015-05-21 DIAGNOSIS — F17210 Nicotine dependence, cigarettes, uncomplicated: Secondary | ICD-10-CM | POA: Diagnosis present

## 2015-05-21 DIAGNOSIS — E785 Hyperlipidemia, unspecified: Secondary | ICD-10-CM | POA: Diagnosis present

## 2015-05-21 DIAGNOSIS — D631 Anemia in chronic kidney disease: Secondary | ICD-10-CM | POA: Diagnosis present

## 2015-05-21 DIAGNOSIS — I2510 Atherosclerotic heart disease of native coronary artery without angina pectoris: Secondary | ICD-10-CM | POA: Diagnosis present

## 2015-05-21 DIAGNOSIS — J449 Chronic obstructive pulmonary disease, unspecified: Secondary | ICD-10-CM | POA: Diagnosis present

## 2015-05-21 DIAGNOSIS — E1122 Type 2 diabetes mellitus with diabetic chronic kidney disease: Secondary | ICD-10-CM | POA: Diagnosis present

## 2015-05-21 DIAGNOSIS — Z794 Long term (current) use of insulin: Secondary | ICD-10-CM | POA: Diagnosis not present

## 2015-05-21 DIAGNOSIS — Z955 Presence of coronary angioplasty implant and graft: Secondary | ICD-10-CM

## 2015-05-21 DIAGNOSIS — K3184 Gastroparesis: Secondary | ICD-10-CM | POA: Diagnosis present

## 2015-05-21 DIAGNOSIS — N186 End stage renal disease: Secondary | ICD-10-CM | POA: Diagnosis present

## 2015-05-21 DIAGNOSIS — F329 Major depressive disorder, single episode, unspecified: Secondary | ICD-10-CM | POA: Diagnosis present

## 2015-05-21 DIAGNOSIS — I5032 Chronic diastolic (congestive) heart failure: Secondary | ICD-10-CM | POA: Diagnosis present

## 2015-05-21 DIAGNOSIS — E11319 Type 2 diabetes mellitus with unspecified diabetic retinopathy without macular edema: Secondary | ICD-10-CM | POA: Diagnosis present

## 2015-05-21 DIAGNOSIS — R109 Unspecified abdominal pain: Secondary | ICD-10-CM

## 2015-05-21 DIAGNOSIS — Z79899 Other long term (current) drug therapy: Secondary | ICD-10-CM

## 2015-05-21 LAB — COMPREHENSIVE METABOLIC PANEL
ALT: 14 U/L (ref 14–54)
AST: 24 U/L (ref 15–41)
Albumin: 2.1 g/dL — ABNORMAL LOW (ref 3.5–5.0)
Alkaline Phosphatase: 148 U/L — ABNORMAL HIGH (ref 38–126)
Anion gap: 9 (ref 5–15)
BUN: <5 mg/dL — ABNORMAL LOW (ref 6–20)
CO2: 31 mmol/L (ref 22–32)
Calcium: 7.7 mg/dL — ABNORMAL LOW (ref 8.9–10.3)
Chloride: 98 mmol/L — ABNORMAL LOW (ref 101–111)
Creatinine, Ser: 2.37 mg/dL — ABNORMAL HIGH (ref 0.44–1.00)
GFR calc Af Amer: 25 mL/min — ABNORMAL LOW (ref >60)
GFR calc non Af Amer: 22 mL/min — ABNORMAL LOW (ref >60)
Glucose, Bld: 85 mg/dL (ref 65–99)
Potassium: 2.8 mmol/L — ABNORMAL LOW (ref 3.5–5.1)
Sodium: 138 mmol/L (ref 135–145)
Total Bilirubin: 0.8 mg/dL (ref 0.3–1.2)
Total Protein: 6.1 g/dL — ABNORMAL LOW (ref 6.5–8.1)

## 2015-05-21 LAB — CBC
HCT: 26.3 % — ABNORMAL LOW (ref 35.0–47.0)
Hemoglobin: 8.7 g/dL — ABNORMAL LOW (ref 12.0–16.0)
MCH: 28.5 pg (ref 26.0–34.0)
MCHC: 33.2 g/dL (ref 32.0–36.0)
MCV: 86 fL (ref 80.0–100.0)
Platelets: 226 10*3/uL (ref 150–440)
RBC: 3.06 MIL/uL — ABNORMAL LOW (ref 3.80–5.20)
RDW: 20 % — ABNORMAL HIGH (ref 11.5–14.5)
WBC: 4.5 10*3/uL (ref 3.6–11.0)

## 2015-05-21 LAB — GLUCOSE, CAPILLARY
Glucose-Capillary: 60 mg/dL — ABNORMAL LOW (ref 65–99)
Glucose-Capillary: 64 mg/dL — ABNORMAL LOW (ref 65–99)
Glucose-Capillary: 88 mg/dL (ref 65–99)

## 2015-05-21 LAB — TROPONIN I: Troponin I: <0.03 ng/mL (ref <0.031)

## 2015-05-21 LAB — LIPASE, BLOOD: Lipase: <10 U/L — ABNORMAL LOW (ref 22–51)

## 2015-05-21 MED ORDER — PROMETHAZINE HCL 25 MG PO TABS: 12.5000 mg | ORAL_TABLET | Freq: Four times a day (QID) | ORAL | Status: DC | PRN

## 2015-05-21 MED ORDER — PANCRELIPASE (LIP-PROT-AMYL) 12000-38000 UNITS PO CPEP
36000.0000 [IU] | ORAL_CAPSULE | Freq: Three times a day (TID) | ORAL | Status: DC
  Administered 2015-05-22 – 2015-05-24 (×6): 36000 [IU] via ORAL
  Filled 2015-05-21 (×7): qty 3

## 2015-05-21 MED ORDER — LORATADINE 10 MG PO TABS
10.0000 mg | ORAL_TABLET | Freq: Every day | ORAL | Status: DC
  Administered 2015-05-21 – 2015-05-24 (×4): 10 mg via ORAL
  Filled 2015-05-21 (×4): qty 1

## 2015-05-21 MED ORDER — PIPERACILLIN-TAZOBACTAM 3.375 G IVPB: 3.3750 g | Freq: Once | INTRAVENOUS | Status: DC

## 2015-05-21 MED ORDER — FAMOTIDINE 20 MG PO TABS
20.0000 mg | ORAL_TABLET | Freq: Every day | ORAL | Status: DC
  Administered 2015-05-21 – 2015-05-24 (×4): 20 mg via ORAL
  Filled 2015-05-21 (×4): qty 1

## 2015-05-21 MED ORDER — MORPHINE SULFATE (PF) 4 MG/ML IV SOLN
INTRAVENOUS | Status: AC
  Administered 2015-05-21: 4 mg via INTRAVENOUS
  Filled 2015-05-21: qty 1

## 2015-05-21 MED ORDER — INFLUENZA VAC SPLIT QUAD 0.5 ML IM SUSY
0.5000 mL | PREFILLED_SYRINGE | INTRAMUSCULAR | Status: AC
  Administered 2015-05-22: 0.5 mL via INTRAMUSCULAR
  Filled 2015-05-21: qty 0.5

## 2015-05-21 MED ORDER — ATORVASTATIN CALCIUM 20 MG PO TABS
20.0000 mg | ORAL_TABLET | Freq: Every evening | ORAL | Status: DC
  Administered 2015-05-21 – 2015-05-23 (×3): 20 mg via ORAL
  Filled 2015-05-21 (×2): qty 1

## 2015-05-21 MED ORDER — ONDANSETRON HCL 4 MG/2ML IJ SOLN
4.0000 mg | Freq: Four times a day (QID) | INTRAMUSCULAR | Status: DC | PRN
  Administered 2015-05-24: 4 mg via INTRAVENOUS
  Filled 2015-05-21: qty 2

## 2015-05-21 MED ORDER — HALOPERIDOL LACTATE 5 MG/ML IJ SOLN
2.0000 mg | Freq: Once | INTRAMUSCULAR | Status: AC
  Administered 2015-05-21: 2 mg via INTRAVENOUS

## 2015-05-21 MED ORDER — NITROGLYCERIN 0.4 MG SL SUBL: 0.4000 mg | SUBLINGUAL_TABLET | SUBLINGUAL | Status: DC | PRN

## 2015-05-21 MED ORDER — ASPIRIN EC 81 MG PO TBEC
81.0000 mg | DELAYED_RELEASE_TABLET | Freq: Every day | ORAL | Status: DC
  Administered 2015-05-21 – 2015-05-24 (×4): 81 mg via ORAL
  Filled 2015-05-21 (×4): qty 1

## 2015-05-21 MED ORDER — CITALOPRAM HYDROBROMIDE 20 MG PO TABS
20.0000 mg | ORAL_TABLET | Freq: Every day | ORAL | Status: DC
  Administered 2015-05-21 – 2015-05-24 (×4): 20 mg via ORAL
  Filled 2015-05-21 (×4): qty 1

## 2015-05-21 MED ORDER — ONDANSETRON HCL 4 MG PO TABS: 4.0000 mg | ORAL_TABLET | Freq: Four times a day (QID) | ORAL | Status: DC | PRN

## 2015-05-21 MED ORDER — ACETAMINOPHEN 650 MG RE SUPP: 650.0000 mg | Freq: Four times a day (QID) | RECTAL | Status: DC | PRN

## 2015-05-21 MED ORDER — HEPARIN SODIUM (PORCINE) 5000 UNIT/ML IJ SOLN
5000.0000 [IU] | Freq: Three times a day (TID) | INTRAMUSCULAR | Status: DC
  Administered 2015-05-22 – 2015-05-24 (×5): 5000 [IU] via SUBCUTANEOUS
  Filled 2015-05-21 (×6): qty 1

## 2015-05-21 MED ORDER — SUCRALFATE 1 G PO TABS
1.0000 g | ORAL_TABLET | Freq: Three times a day (TID) | ORAL | Status: DC
  Administered 2015-05-22 – 2015-05-24 (×8): 1 g via ORAL
  Filled 2015-05-21 (×9): qty 1

## 2015-05-21 MED ORDER — HALOPERIDOL LACTATE 5 MG/ML IJ SOLN
INTRAMUSCULAR | Status: AC
Start: 2015-05-21 — End: 2015-05-21
  Administered 2015-05-21: 2 mg via INTRAVENOUS
  Filled 2015-05-21: qty 1

## 2015-05-21 MED ORDER — MORPHINE SULFATE (PF) 4 MG/ML IV SOLN
4.0000 mg | Freq: Once | INTRAVENOUS | Status: AC
  Administered 2015-05-21: 4 mg via INTRAVENOUS

## 2015-05-21 MED ORDER — VANCOMYCIN HCL IN DEXTROSE 750-5 MG/150ML-% IV SOLN
750.0000 mg | INTRAVENOUS | Status: DC
  Administered 2015-05-23: 750 mg via INTRAVENOUS
  Filled 2015-05-21 (×2): qty 150

## 2015-05-21 MED ORDER — RENA-VITE PO TABS
1.0000 | ORAL_TABLET | Freq: Every day | ORAL | Status: DC
  Administered 2015-05-22 – 2015-05-24 (×3): 1 via ORAL
  Filled 2015-05-21 (×3): qty 1

## 2015-05-21 MED ORDER — HYDROCODONE-ACETAMINOPHEN 5-325 MG PO TABS
1.0000 | ORAL_TABLET | Freq: Three times a day (TID) | ORAL | Status: DC | PRN
  Administered 2015-05-23: 1 via ORAL
  Filled 2015-05-21: qty 1

## 2015-05-21 MED ORDER — INSULIN ASPART 100 UNIT/ML ~~LOC~~ SOLN: 2.0000 [IU] | Freq: Three times a day (TID) | SUBCUTANEOUS | Status: DC | PRN

## 2015-05-21 MED ORDER — ACETAMINOPHEN 325 MG PO TABS
650.0000 mg | ORAL_TABLET | Freq: Four times a day (QID) | ORAL | Status: DC | PRN
  Administered 2015-05-22: 650 mg via ORAL
  Filled 2015-05-21: qty 2

## 2015-05-21 MED ORDER — CARVEDILOL 25 MG PO TABS
25.0000 mg | ORAL_TABLET | Freq: Two times a day (BID) | ORAL | Status: DC
  Administered 2015-05-22 – 2015-05-24 (×4): 25 mg via ORAL
  Filled 2015-05-21 (×4): qty 1

## 2015-05-21 MED ORDER — CLOPIDOGREL BISULFATE 75 MG PO TABS
75.0000 mg | ORAL_TABLET | Freq: Every day | ORAL | Status: DC
  Administered 2015-05-21 – 2015-05-24 (×4): 75 mg via ORAL
  Filled 2015-05-21 (×4): qty 1

## 2015-05-21 MED ORDER — AMLODIPINE BESYLATE 10 MG PO TABS
10.0000 mg | ORAL_TABLET | Freq: Every day | ORAL | Status: DC
  Administered 2015-05-21 – 2015-05-24 (×4): 10 mg via ORAL
  Filled 2015-05-21 (×4): qty 1

## 2015-05-21 MED ORDER — CHOLECALCIFEROL 10 MCG (400 UNIT) PO TABS
400.0000 [IU] | ORAL_TABLET | Freq: Every day | ORAL | Status: DC
Start: 2015-05-21 — End: 2015-05-24
  Administered 2015-05-21 – 2015-05-24 (×4): 400 [IU] via ORAL
  Filled 2015-05-21 (×4): qty 1

## 2015-05-21 MED ORDER — ALBUTEROL SULFATE (2.5 MG/3ML) 0.083% IN NEBU: 2.5000 mg | INHALATION_SOLUTION | RESPIRATORY_TRACT | Status: DC | PRN

## 2015-05-21 MED ORDER — PANTOPRAZOLE SODIUM 40 MG PO TBEC
40.0000 mg | DELAYED_RELEASE_TABLET | Freq: Every day | ORAL | Status: DC
  Administered 2015-05-22 – 2015-05-24 (×2): 40 mg via ORAL
  Filled 2015-05-21 (×3): qty 1

## 2015-05-21 MED ORDER — RANOLAZINE ER 500 MG PO TB12
1000.0000 mg | ORAL_TABLET | Freq: Two times a day (BID) | ORAL | Status: DC
  Administered 2015-05-21 – 2015-05-24 (×6): 1000 mg via ORAL
  Filled 2015-05-21 (×6): qty 2

## 2015-05-21 MED ORDER — SODIUM CHLORIDE 0.9 % IV SOLN
1500.0000 mg | Freq: Once | INTRAVENOUS | Status: AC
  Administered 2015-05-21: 1500 mg via INTRAVENOUS
  Filled 2015-05-21: qty 1500

## 2015-05-21 MED ORDER — PIPERACILLIN-TAZOBACTAM 3.375 G IVPB
3.3750 g | Freq: Two times a day (BID) | INTRAVENOUS | Status: DC
  Administered 2015-05-21 – 2015-05-23 (×4): 3.375 g via INTRAVENOUS
  Filled 2015-05-21 (×8): qty 50

## 2015-05-21 MED ORDER — METOCLOPRAMIDE HCL 5 MG PO TABS
5.0000 mg | ORAL_TABLET | Freq: Three times a day (TID) | ORAL | Status: DC
  Administered 2015-05-21 – 2015-05-24 (×10): 5 mg via ORAL
  Filled 2015-05-21 (×10): qty 1

## 2015-05-21 MED ORDER — SEVELAMER CARBONATE 800 MG PO TABS
800.0000 mg | ORAL_TABLET | Freq: Three times a day (TID) | ORAL | Status: DC
  Administered 2015-05-24: 800 mg via ORAL
  Filled 2015-05-21 (×5): qty 1

## 2015-05-21 NOTE — Progress Notes (Signed)
Received in report that MD doesn't want pt to be on K+ supplement d/t level being at 2.8. Said to allow dialysis to correct it.

## 2015-05-21 NOTE — ED Notes (Signed)
Pt has been having N, V off and on for about 3 months now, however, today, vomiting has increased, and RUQ abd pain. Has been unable to keep food down, a little at most.

## 2015-05-21 NOTE — Progress Notes (Signed)
ANTIBIOTIC CONSULT NOTE - INITIAL  Pharmacy Consult for Vancomycin and Zosyn Indication: pneumonia  Allergies  Allergen Reactions  . Ace Inhibitors Swelling  . Ativan [Lorazepam] Other (See Comments)    Reaction:  Hallucinations and headaches  . Codeine Nausea And Vomiting  . Compazine [Prochlorperazine Edisylate] Nausea And Vomiting  . Gabapentin Other (See Comments)    Reaction:  Unknown   . Losartan Other (See Comments)    Reaction:  Unknown   . Phenergan [Promethazine Hcl] Other (See Comments)    Hallucinations  . Scopolamine Other (See Comments)    Reaction:  Unknown   . Zofran [Ondansetron Hcl] Other (See Comments)    Reaction:  Dementia   . Oxycodone Anxiety  . Tape Rash    Patient Measurements: Height: 5\' 6"  (167.6 cm) Weight: 184 lb (83.462 kg) IBW/kg (Calculated) : 59.3 Adjusted Body Weight: 69 kg  Vital Signs: Temp: 97.9 F (36.6 C) (08/29 1553) Temp Source: Oral (08/29 1553) BP: 167/57 mmHg (08/29 1553) Pulse Rate: 81 (08/29 1553) Intake/Output from previous day:   Intake/Output from this shift:    Labs:  Recent Labs  05/20/15 1045 05/20/15 1233 05/21/15 1613  WBC  --  4.5 4.5  HGB  --  8.9* 8.7*  PLT  --  239 226  CREATININE 3.62*  --  2.37*   Estimated Creatinine Clearance: 29.2 mL/min (by C-G formula based on Cr of 2.37). No results for input(s): VANCOTROUGH, VANCOPEAK, VANCORANDOM, GENTTROUGH, GENTPEAK, GENTRANDOM, TOBRATROUGH, TOBRAPEAK, TOBRARND, AMIKACINPEAK, AMIKACINTROU, AMIKACIN in the last 72 hours.   Microbiology: Recent Results (from the past 720 hour(s))  MRSA PCR Screening     Status: None   Collection Time: 04/24/15  9:00 AM  Result Value Ref Range Status   MRSA by PCR NEGATIVE NEGATIVE Final    Comment:        The GeneXpert MRSA Assay (FDA approved for NASAL specimens only), is one component of a comprehensive MRSA colonization surveillance program. It is not intended to diagnose MRSA infection nor to guide  or monitor treatment for MRSA infections.     Medical History: Past Medical History  Diagnosis Date  . Asthma   . Collagen vascular disease   . Diabetes mellitus without complication   . Hypertension   . Coronary artery disease     a. cath 2013: stenting to RCA (report not available); b. cath 2014: LM nl, pLAD 40%, mLAD nl, ost LCx 40%, mid LCx nl, pRCA 30% @ site of prior stent, mRCA 50%  . Diabetic neuropathy   . ESRD (end stage renal disease) on dialysis     M-W-F  . GERD (gastroesophageal reflux disease)   . Hx of pancreatitis 2015  . Myocardial infarction   . Pneumonia   . Mitral regurgitation     a. echo 10/2013: EF 62%, noWMA, mildly dilated LA, mild to mod MR/TR, GR1DD  . COPD (chronic obstructive pulmonary disease)   . Acute diastolic CHF (congestive heart failure) 04/05/2015  . dialysis 2006      Assessment: 54 yo female ordered vancomycin and Zosyn for PNA/HCAP. Pt on HD MWF. Per ED MD note, last dialyzed this AM.   Goal of Therapy:  Pre-HD vancomycin level 15-25 mcg/ml  Plan:  Will order vancomycin 1500 mg IV x1 loading dose  Will order vancomycin 750mg  IV qHD (ordered for MWF) Trough ordered before 3rd HD session Will need to continue to follow HD schedule and charting of doses  Will order Zosyn 3.375 g IV q12h  EIMina Marble, Alejandro Adcox L 05/21/2015,7:00 PM

## 2015-05-21 NOTE — Progress Notes (Addendum)
Blood sugar now at 64. Another apple juice given. Will recheck Blood sugar within 15-30 min.

## 2015-05-21 NOTE — ED Provider Notes (Signed)
[facility] Emergency Department Provider Note  ____________________________________________  Time seen: Approximately 530 PM  I have reviewed the triage vital signs and the nursing notes.   HISTORY  Chief Complaint Abdominal Pain; Nausea; and Emesis    HPI [name] is a 54 y.o. female with a history of end-stage renal disease on dialysis as well as gastroparesis who is presenting today for diffuse abdominal pain with nausea and vomiting. She's had multiple episodes of vomiting today. No blood in the vomitus. Says that this feels like her gastroparesis presentation. Has been here recently several times for similar complaints with multiple imaging modality workups which have not revealed any acute pathology. She has been given medications such as Reglan, Haldol and morphine that relieved her symptoms. He was last dialyzed this morning.   Past Medical History  Diagnosis Date  . Asthma   . Collagen vascular disease   . Diabetes mellitus without complication   . Hypertension   . Coronary artery disease     a. cath 2013: stenting to RCA (report not available); b. cath 2014: LM nl, pLAD 40%, mLAD nl, ost LCx 40%, mid LCx nl, pRCA 30% @ site of prior stent, mRCA 50%  . Diabetic neuropathy   . ESRD (end stage renal disease) on dialysis     M-W-F  . GERD (gastroesophageal reflux disease)   . Hx of pancreatitis 2015  . Myocardial infarction   . Pneumonia   . Mitral regurgitation     a. echo 10/2013: EF 62%, noWMA, mildly dilated LA, mild to mod MR/TR, GR1DD  . COPD (chronic obstructive pulmonary disease)   . Acute diastolic CHF (congestive heart failure) 04/05/2015  . dialysis 2006    Patient Active Problem List   Diagnosis Date Noted  . Hypoglycemia 04/24/2015  . Unresponsiveness 04/24/2015  . Bradycardia 04/24/2015  . Hypothermia 04/24/2015  . Acute respiratory failure 04/24/2015  . Acute diastolic CHF (congestive heart failure) 04/05/2015  . Diabetic  gastroparesis 04/05/2015  . Hypokalemia 04/05/2015  . Generalized weakness 04/05/2015  . Acute pulmonary edema 04/03/2015  . Nausea and vomiting 04/03/2015  . Hypoglycemia associated with diabetes 04/03/2015  . Anemia of chronic disease 04/03/2015  . Secondary hyperparathyroidism 04/03/2015  . Pressure ulcer 04/02/2015  . Acute respiratory failure with hypoxia 04/01/2015  . Adjustment disorder with anxiety 03/14/2015  . Somatic symptom disorder, mild 03/08/2015  . Coronary artery disease involving native coronary artery of native heart without angina pectoris   . Nausea & vomiting 03/06/2015  . Abdominal pain 03/06/2015  . DM (diabetes mellitus) 03/06/2015  . HTN (hypertension) 03/06/2015  . Gastroparesis 02/24/2015  . Pleural effusion 02/19/2015  . HCAP (healthcare-associated pneumonia) 02/19/2015  . End-stage renal disease on hemodialysis 02/19/2015    Past Surgical History  Procedure Laterality Date  . Cholecystectomy    . Appendectomy    . Abdominal hysterectomy    . Dialysis fistula creation Left     upper arm  . Esophagogastroduodenoscopy N/A 03/08/2015    Procedure: ESOPHAGOGASTRODUODENOSCOPY (EGD);  Surgeon: Manya Silvas, MD;  Location: [facility] ENDOSCOPY;  Service: Endoscopy;  Laterality: N/A;    Current Outpatient Rx  Name  Route  Sig  Dispense  Refill  . albuterol (PROVENTIL HFA;VENTOLIN HFA) 108 (90 BASE) MCG/ACT inhaler      Inhale 4-6 puffs by mouth every 4 hours as needed for wheezing, cough, and/or shortness of breath Patient taking differently: Inhale 4 puffs into the lungs every 4 (four) hours as needed for  wheezing or shortness of breath.    1 Inhaler   1   . amLODipine (NORVASC) 10 MG tablet   Oral   Take 10 mg by mouth daily.         Marland Kitchen aspirin EC 81 MG tablet   Oral   Take 81 mg by mouth daily.         . B Complex-C-Biotin-E-FA (RENATABS PO)   Oral   Take 1 tablet by mouth daily.         . cetirizine (ZYRTEC) 10 MG tablet   Oral    Take 10 mg by mouth daily.         . citalopram (CELEXA) 20 MG tablet   Oral   Take 1 tablet by mouth daily with supper.         . clopidogrel (PLAVIX) 75 MG tablet   Oral   Take 75 mg by mouth daily.         . diazepam (VALIUM) 5 MG tablet   Oral   Take 1 tablet (5 mg total) by mouth every 8 (eight) hours as needed for muscle spasms.   20 tablet   0   . docusate sodium (COLACE) 100 MG capsule   Oral   Take 1 capsule (100 mg total) by mouth 2 (two) times daily.   10 capsule   0   . HYDROcodone-acetaminophen (NORCO) 5-325 MG per tablet   Oral   Take 1 tablet by mouth every 6 (six) hours as needed for moderate pain.   20 tablet   0   . insulin aspart (NOVOLOG) 100 UNIT/ML injection   Subcutaneous   Inject 2-10 Units into the skin 3 (three) times daily before meals. 151-200 2 units, 201-250 4 units, 251-300 6 units, 301-350 8 units, 351-401 or higher 10 units and notify MD.         . insulin detemir (LEVEMIR) 100 UNIT/ML injection   Subcutaneous   Inject 8 Units into the skin at bedtime.          . lipase/protease/amylase (CREON) 12000 UNITS CPEP capsule   Oral   Take 36,000 Units by mouth 3 (three) times daily with meals. 3 capsules 3 times daily with meals and 1 capsules  with snacks         . meclizine (ANTIVERT) 25 MG tablet   Oral   Take 1 tablet by mouth 3 (three) times daily as needed.      4   . metoCLOPramide (REGLAN) 5 MG tablet   Oral   Take 1 tablet (5 mg total) by mouth 4 (four) times daily -  before meals and at bedtime. Patient taking differently: Take 5 mg by mouth 3 (three) times daily.    40 tablet   0   . metoprolol (LOPRESSOR) 50 MG tablet   Oral   Take 50 mg by mouth 2 (two) times daily.         . nitroGLYCERIN (NITROSTAT) 0.4 MG SL tablet   Sublingual   Place 0.4 mg under the tongue every 5 (five) minutes x 3 doses as needed for chest pain.          . Omega-3 Fatty Acids (FISH OIL) 1000 MG CAPS   Oral   Take 1 capsule  by mouth 2 (two) times daily.         . ondansetron (ZOFRAN) 4 MG tablet   Oral   Take 4 mg by mouth every 8 (eight) hours as needed  for nausea or vomiting.         . pantoprazole (PROTONIX) 40 MG tablet   Oral   Take 1 tablet (40 mg total) by mouth 2 (two) times daily before a meal.   60 tablet   0   . polyethylene glycol (MIRALAX / GLYCOLAX) packet   Oral   Take 17 g by mouth daily.   14 each   0   . ranitidine (ZANTAC) 150 MG tablet   Oral   Take 150 mg by mouth 2 (two) times daily.         Marland Kitchen senna (SENOKOT) 8.6 MG TABS tablet   Oral   Take 1 tablet (8.6 mg total) by mouth daily as needed for mild constipation. Patient taking differently: Take 1 tablet by mouth 2 (two) times daily.    120 each   0   . sevelamer carbonate (RENVELA) 800 MG tablet   Oral   Take 1,600 mg by mouth 3 (three) times daily with meals.         . sucralfate (CARAFATE) 1 G tablet   Oral   Take 1 tablet (1 g total) by mouth 4 (four) times daily -  with meals and at bedtime.   120 tablet   0   . Vitamin D, Cholecalciferol, 400 UNITS TABS   Oral   Take 400 Units by mouth 2 (two) times daily.            Allergies Ace inhibitors; Ativan; Codeine; Compazine; Losartan; Phenergan; Zofran; Oxycodone; and Tape  Family History  Problem Relation Age of Onset  . Kidney disease Mother   . Diabetes Mother     Social History Social History  Substance Use Topics  . Smoking status: Current Some Day Smoker -- 0.50 packs/day    Types: Cigarettes    Last Attempt to Quit: 02/13/2015  . Smokeless tobacco: Never Used  . Alcohol Use: No    Review of Systems Constitutional: No fever/chills Eyes: No visual changes. ENT: No sore throat. Cardiovascular: Denies chest pain. Respiratory: Patient with ongoing cough with sputum production. On antibiotic which she does not have the name of at home. However, symptoms are not improving. Gastrointestinal: No constipation. Genitourinary: Negative for  dysuria. Musculoskeletal: Negative for back pain. Skin: Negative for rash. Neurological: Negative for headaches, focal weakness or numbness.  10-point ROS otherwise negative.  ____________________________________________   PHYSICAL EXAM:  VITAL SIGNS: ED Triage Vitals  Enc Vitals Group     BP 05/21/15 1553 167/57 mmHg     Pulse Rate 05/21/15 1553 81     Resp 05/21/15 1553 18     Temp 05/21/15 1553 97.9 F (36.6 C)     Temp Source 05/21/15 1553 Oral     SpO2 05/21/15 1553 100 %     Weight 05/21/15 1553 184 lb (83.462 kg)     Height 05/21/15 1553 5\' 6"  (1.676 m)     Head Cir --      Peak Flow --      Pain Score 05/21/15 1554 10     Pain Loc --      Pain Edu? --      Excl. in [place]? --     Constitutional: Alert and oriented. Empty emesis bag at the bedside. Eyes: Conjunctivae are normal. PERRL. EOMI. Head: Atraumatic. Nose: No congestion/rhinnorhea. Mouth/Throat: Mucous membranes are moist.  Oropharynx non-erythematous. Neck: No stridor.   Cardiovascular: Normal rate, regular rhythm. Grossly normal heart sounds.  Good peripheral circulation. Respiratory:  Normal respiratory effort.  No retractions. Lungs CTAB. Actively coughs in the room. Gastrointestinal: Soft with tenderness to the right upper quadrant, epigastrium as well as right lower quadrants. There is no rebound or guarding. No distention. No abdominal bruits. No CVA tenderness. Musculoskeletal: No lower extremity tenderness nor edema.  No joint effusions. Neurologic:  Normal speech and language. No gross focal neurologic deficits are appreciated. No gait instability. Skin:  Skin is warm, dry and intact. No rash noted. Psychiatric: Mood and affect are normal. Speech and behavior are normal.  ____________________________________________   LABS (all labs ordered are listed, but only abnormal results are displayed)  Labs Reviewed  LIPASE, BLOOD - Abnormal; Notable for the following:    Lipase <10 (*)    All other  components within normal limits  COMPREHENSIVE METABOLIC PANEL - Abnormal; Notable for the following:    Potassium 2.8 (*)    Chloride 98 (*)    BUN <5 (*)    Creatinine, Ser 2.37 (*)    Calcium 7.7 (*)    Total Protein 6.1 (*)    Albumin 2.1 (*)    Alkaline Phosphatase 148 (*)    GFR calc non Af Amer 22 (*)    GFR calc Af Amer 25 (*)    All other components within normal limits  CBC - Abnormal; Notable for the following:    RBC 3.06 (*)    Hemoglobin 8.7 (*)    HCT 26.3 (*)    RDW 20.0 (*)    All other components within normal limits  TROPONIN I  URINALYSIS COMPLETEWITH MICROSCOPIC ([facility] ONLY)   ____________________________________________  EKG  ED ECG REPORT I, Doran Stabler, the attending physician, personally viewed and interpreted this ECG.   Date: 05/21/2015  EKG Time: 1615  Rate: 83  Rhythm: normal sinus rhythm  Axis: Normal axis  Intervals:none  ST&T Change: T wave inversions in V5 and V6 as well as aVF which are unchanged from previous EKG done on 05/14/2015.  ____________________________________________  RADIOLOGY  Nonobstructive bowel gas pattern. Similar appearance of the chest as from previous on August 22 with bilateral pleural effusions with underlying consolidation. I personally reviewed these images. ____________________________________________   PROCEDURES    ____________________________________________   INITIAL IMPRESSION / ASSESSMENT AND PLAN / ED COURSE  Pertinent labs & imaging results that were available during my care of the patient were reviewed by me and considered in my medical decision making (see chart for details).  ----------------------------------------- 6:33 PM on 05/21/2015 -----------------------------------------  Patient is resting comfortably at this time without any further nausea and vomiting. We'll admit for failure of outpatient treatment of pneumonia. Another Dr.  Verdell Carmine. ____________________________________________   FINAL CLINICAL IMPRESSION(S) / ED DIAGNOSES  Acute gastroparesis with nausea and vomiting. Pneumonia which is refractory to outpatient treatment is unknown antibiotic. Initial visit.    Orbie Pyo, MD 05/21/15 [phone]

## 2015-05-21 NOTE — H&P (Signed)
[facility] [place] NAME: [name]    MR#:  [id number]  DATE OF BIRTH:  1961-01-05  DATE OF ADMISSION:  05/21/2015  PRIMARY CARE PHYSICIAN: Ellamae Sia, MD   REQUESTING/REFERRING PHYSICIAN: Dr. Larae Grooms  CHIEF COMPLAINT:   Chief Complaint  Patient presents with  . Abdominal Pain  . Nausea  . Emesis    HISTORY OF PRESENT ILLNESS:  [name]  is a 54 y.o. female with a known history of end-stage renal disease on hemodialysis, diabetes, hypertension, secondary hyperparathyroidism, diabetic neuropathy, GERD, history of chronic pancreatitis, COPD, diastolic CHF, who presents to the hospital due to cough, shortness of breath and suspected to have a pneumonia. Patient went to see her primary care physician today and was complaining of the above symptoms and was sent to the ER for further evaluation. Patient has recently been started on antibiotic for pneumonia which she cannot recall which one it is but has not clinically improved. She presents to the ER was noted to be mildly hypoxic but continues to have a cough which is productive with the green-yellow's sputum and chest x-ray findings suggestive of pneumonia. Hospitalist services were contacted further treatment and evaluation.  PAST MEDICAL HISTORY:   Past Medical History  Diagnosis Date  . Asthma   . Collagen vascular disease   . Diabetes mellitus without complication   . Hypertension   . Coronary artery disease     a. cath 2013: stenting to RCA (report not available); b. cath 2014: LM nl, pLAD 40%, mLAD nl, ost LCx 40%, mid LCx nl, pRCA 30% @ site of prior stent, mRCA 50%  . Diabetic neuropathy   . ESRD (end stage renal disease) on dialysis     M-W-F  . GERD (gastroesophageal reflux disease)   . Hx of pancreatitis 2015  . Myocardial infarction   . Pneumonia   . Mitral regurgitation     a. echo 10/2013: EF 62%, noWMA, mildly dilated LA, mild to mod MR/TR, GR1DD   . COPD (chronic obstructive pulmonary disease)   . Acute diastolic CHF (congestive heart failure) 04/05/2015  . dialysis 2006    PAST SURGICAL HISTORY:   Past Surgical History  Procedure Laterality Date  . Cholecystectomy    . Appendectomy    . Abdominal hysterectomy    . Dialysis fistula creation Left     upper arm  . Esophagogastroduodenoscopy N/A 03/08/2015    Procedure: ESOPHAGOGASTRODUODENOSCOPY (EGD);  Surgeon: Manya Silvas, MD;  Location: [facility] ENDOSCOPY;  Service: Endoscopy;  Laterality: N/A;    SOCIAL HISTORY:   Social History  Substance Use Topics  . Smoking status: Current Some Day Smoker -- 0.50 packs/day    Types: Cigarettes    Last Attempt to Quit: 02/13/2015  . Smokeless tobacco: Never Used  . Alcohol Use: No    FAMILY HISTORY:   Family History  Problem Relation Age of Onset  . Kidney disease Mother   . Diabetes Mother     DRUG ALLERGIES:   Allergies  Allergen Reactions  . Ace Inhibitors Swelling  . Ativan [Lorazepam] Other (See Comments)    Reaction:  Hallucinations and headaches  . Codeine Nausea And Vomiting  . Compazine [Prochlorperazine Edisylate] Nausea And Vomiting  . Gabapentin Other (See Comments)    Reaction:  Unknown   . Losartan Other (See Comments)    Reaction:  Unknown   . Phenergan [Promethazine Hcl] Other (See Comments)    Reaction:  Hallucinations  . Scopolamine Other (See Comments)    Reaction:  Unknown   . Zofran [Ondansetron Hcl] Other (See Comments)    Reaction:  Dementia   . Oxycodone Anxiety  . Tape Rash    REVIEW OF SYSTEMS:   Review of Systems  Constitutional: Negative for fever and weight loss.  HENT: Negative for congestion, nosebleeds and tinnitus.   Eyes: Negative for blurred vision, double vision and redness.  Respiratory: Positive for cough, sputum production (green-yellow. ) and shortness of breath. Negative for hemoptysis.   Cardiovascular: Negative for chest pain, orthopnea, leg swelling and PND.   Gastrointestinal: Positive for nausea and abdominal pain. Negative for vomiting, diarrhea and melena.  Genitourinary: Negative for dysuria, urgency and hematuria.  Musculoskeletal: Negative for joint pain and falls.  Neurological: Positive for weakness (generalized). Negative for dizziness, tingling, sensory change, focal weakness, seizures and headaches.  Endo/Heme/Allergies: Negative for polydipsia. Does not bruise/bleed easily.  Psychiatric/Behavioral: Negative for depression and memory loss. The patient is not nervous/anxious.     MEDICATIONS AT HOME:   Prior to Admission medications   Medication Sig Start Date End Date Taking? Authorizing Provider  albuterol (PROVENTIL) (2.5 MG/3ML) 0.083% nebulizer solution Take 2.5 mg by nebulization every 4 (four) hours as needed for wheezing or shortness of breath.   Yes Historical Provider, MD  amLODipine (NORVASC) 10 MG tablet Take 10 mg by mouth daily.   Yes Historical Provider, MD  aspirin EC 81 MG tablet Take 81 mg by mouth daily.   Yes Historical Provider, MD  atorvastatin (LIPITOR) 20 MG tablet Take 20 mg by mouth every evening.   Yes Historical Provider, MD  B Complex-C-Biotin-E-FA (RENATABS PO) Take 1 tablet by mouth daily.   Yes Historical Provider, MD  carvedilol (COREG) 25 MG tablet Take 25 mg by mouth 2 (two) times daily.   Yes Historical Provider, MD  cetirizine (ZYRTEC) 10 MG tablet Take 10 mg by mouth daily.   Yes Historical Provider, MD  citalopram (CELEXA) 20 MG tablet Take 20 mg by mouth daily.   Yes Historical Provider, MD  clopidogrel (PLAVIX) 75 MG tablet Take 75 mg by mouth daily.   Yes Historical Provider, MD  HYDROcodone-acetaminophen (NORCO/VICODIN) 5-325 MG per tablet Take 1 tablet by mouth 3 (three) times daily as needed for moderate pain or severe pain.   Yes Historical Provider, MD  insulin aspart (NOVOLOG) 100 UNIT/ML injection Inject 2-10 Units into the skin 3 (three) times daily as needed for high blood sugar. Pt uses  as needed per sliding scale:    151-200:  2 units  201-250:  4 units 251-300:  6 units 301-350:  8 units  351-401:  10 units and notify MD.   Yes Historical Provider, MD  lipase/protease/amylase (CREON) 12000 UNITS CPEP capsule Take 36,000 Units by mouth 3 (three) times daily with meals.    Yes Historical Provider, MD  metoCLOPramide (REGLAN) 5 MG tablet Take 1 tablet (5 mg total) by mouth 4 (four) times daily -  before meals and at bedtime. 02/24/15  Yes Srikar Sudini, MD  nitroGLYCERIN (NITROSTAT) 0.4 MG SL tablet Place 0.4 mg under the tongue every 5 (five) minutes x 3 doses as needed for chest pain.    Yes Historical Provider, MD  pantoprazole (PROTONIX) 40 MG tablet Take 40 mg by mouth daily.   Yes Historical Provider, MD  promethazine (PHENERGAN) 12.5 MG tablet Take 12.5 mg by mouth every 6 (six) hours as needed for nausea or vomiting.   Yes  Historical Provider, MD  ranitidine (ZANTAC) 150 MG tablet Take 150 mg by mouth daily.    Yes Historical Provider, MD  ranolazine (RANEXA) 1000 MG SR tablet Take 1,000 mg by mouth 2 (two) times daily.   Yes Historical Provider, MD  sevelamer carbonate (RENVELA) 800 MG tablet Take 800 mg by mouth 3 (three) times daily with meals.    Yes Historical Provider, MD  sucralfate (CARAFATE) 1 G tablet Take 1 tablet (1 g total) by mouth 4 (four) times daily -  with meals and at bedtime. 02/24/15  Yes Srikar Sudini, MD  Vitamin D, Cholecalciferol, 400 UNITS TABS Take 400 Units by mouth daily.    Yes Historical Provider, MD  albuterol (PROVENTIL HFA;VENTOLIN HFA) 108 (90 BASE) MCG/ACT inhaler Inhale 4-6 puffs by mouth every 4 hours as needed for wheezing, cough, and/or shortness of breath Patient not taking: Reported on 05/21/2015 02/17/15   Hinda Kehr, MD  diazepam (VALIUM) 5 MG tablet Take 1 tablet (5 mg total) by mouth every 8 (eight) hours as needed for muscle spasms. Patient not taking: Reported on 05/21/2015 04/10/15   Earleen Newport, MD  docusate sodium  (COLACE) 100 MG capsule Take 1 capsule (100 mg total) by mouth 2 (two) times daily. Patient not taking: Reported on 05/21/2015 04/03/15   Theodoro Grist, MD  polyethylene glycol (MIRALAX / GLYCOLAX) packet Take 17 g by mouth daily. Patient not taking: Reported on 05/21/2015 04/10/15   Earleen Newport, MD  senna (SENOKOT) 8.6 MG TABS tablet Take 1 tablet (8.6 mg total) by mouth daily as needed for mild constipation. Patient not taking: Reported on 05/21/2015 04/03/15   Theodoro Grist, MD      VITAL SIGNS:  Blood pressure 167/57, pulse 81, temperature 97.9 F (36.6 C), temperature source Oral, resp. rate 18, height 5\' 6"  (1.676 m), weight 83.462 kg (184 lb), SpO2 100 %.  PHYSICAL EXAMINATION:  Physical Exam  GENERAL:  54 y.o.-year-old patient lying in the bed encephalopathic and lethargic.  EYES: Pupils equal, round, reactive to light and accommodation. No scleral icterus. Extraocular muscles intact.  HEENT: Head atraumatic, normocephalic. Oropharynx and nasopharynx clear. No oropharyngeal erythema, moist oral mucosa  NECK:  Supple, no jugular venous distention. No thyroid enlargement, no tenderness.  LUNGS: Normal breath sounds bilaterally, no wheezing, rhonchi, positive bibasilar rales. No use of accessory muscles of respiration.  CARDIOVASCULAR: S1, S2 RRR. No murmurs, rubs, gallops, clicks.  ABDOMEN: Soft, nontender, nondistended. Bowel sounds present. No organomegaly or mass.  EXTREMITIES: No pedal edema, cyanosis, or clubbing. + 2 pedal & radial pulses b/l.   NEUROLOGIC: Cranial nerves II through XII are intact. No focal Motor or sensory deficits appreciated b/l.  Globally weak PSYCHIATRIC: The patient is alert and oriented x 3. Good affect.  SKIN: No obvious rash, lesion, or ulcer.   Positive left upper extremity AV fistula with good bruit and good thrill.  LABORATORY PANEL:   CBC  Recent Labs Lab 05/21/15 1613  WBC 4.5  HGB 8.7*  HCT 26.3*  PLT 226    ------------------------------------------------------------------------------------------------------------------  Chemistries   Recent Labs Lab 05/21/15 1613  NA 138  K 2.8*  CL 98*  CO2 31  GLUCOSE 85  BUN <5*  CREATININE 2.37*  CALCIUM 7.7*  AST 24  ALT 14  ALKPHOS 148*  BILITOT 0.8   ------------------------------------------------------------------------------------------------------------------  Cardiac Enzymes  Recent Labs Lab 05/21/15 1613  TROPONINI <0.03   ------------------------------------------------------------------------------------------------------------------  RADIOLOGY:  Dg Abd Acute W/chest  05/21/2015   CLINICAL DATA:  Intermittent nausea and vomiting for the past 3 months with epigastric pain and vomiting today, recent diagnosis of pneumonia  EXAM: DG ABDOMEN ACUTE W/ 1V CHEST  COMPARISON:  05/14/2015  FINDINGS: Mild cardiac enlargement. Mild vascular congestion and interstitial prominence. Small bilateral pleural effusions larger on the right. Underlying consolidation bilateral lung bases right greater than left.  No abnormally dilated loops of bowel. Air-fluid levels noted in scattered loops of small and large bowel. Status post cholecystectomy.  IMPRESSION: Nonobstructive bowel gas pattern. Similar appearance of the chest when compared to prior study again indicating bilateral pleural effusions with underlying consolidation. Stable cardiac enlargement and vascular congestion.   Electronically Signed   By: Skipper Cliche M.D.   On: 05/21/2015 18:14     IMPRESSION AND PLAN:   54 year old female with past medical history of end-stage renal disease on hemodialysis, hypertension, diastolic CHF, secondary hyperparathyroidism, chronic pancreatitis, diabetic neuropathy, diabetes, diabetic gastroparesis who presents to the hospital due to shortness of breath cough and noted to have a pneumonia.  #1 pneumonia-likely cause of patient's shortness of  breath weakness and cough. This is likely a nosocomial pneumonia as patient was recently hospitalized. -I will treat the patient with IV vancomycin, Zosyn. Follow blood, sputum cultures and follow patient clinically. She is presently afebrile and hemodynamically stable with a normal white cell count.  #2 generalized weakness/difficulty walking-likely related to underlying pneumonia and deconditioning. -We'll get a physical therapy consult to assess mobility.  #3 hypokalemia-hold off on potassium supplements. Should correct with hemodialysis.  #4 end-stage renal disease on hemodialysis-patient gets hemodialysis on Monday Wednesday Friday. -Consult nephrology.  #5 hypertension-continue Coreg, Norvasc  #6 hyperlipidemia-continue atorvastatin.  #7 diabetes type 2-patient was recently admitted for hypoglycemia is currently on just sliding scale insulin which will continue.  #8 GERD-continue Protonix  #9 chronic pancreatitis-continue pancreatic lipase supplements.  All the records are reviewed and case discussed with ED provider. Management plans discussed with the patient, family and they are in agreement.  CODE STATUS: Full  TOTAL TIME TAKING CARE OF THIS PATIENT: 50 minutes.    Henreitta Leber M.D on 05/21/2015 at 7:35 PM  Between 7am to 6pm - Pager - [phone]  After 6pm go to [email or web address] - password [email or web address] [facility]  Office  [phone]  CC: Primary care physician; Ellamae Sia, MD

## 2015-05-21 NOTE — Progress Notes (Signed)
Pt's blood sugar 60. Apple juice given. Will recheck BS between 15-30 min.

## 2015-05-21 NOTE — Progress Notes (Signed)
Blood sugar 88. Will cont to monitor pt.

## 2015-05-22 DIAGNOSIS — J189 Pneumonia, unspecified organism: Secondary | ICD-10-CM | POA: Diagnosis not present

## 2015-05-22 LAB — CBC
HCT: 24.5 % — ABNORMAL LOW (ref 35.0–47.0)
Hemoglobin: 8.1 g/dL — ABNORMAL LOW (ref 12.0–16.0)
MCH: 28.7 pg (ref 26.0–34.0)
MCHC: 33.1 g/dL (ref 32.0–36.0)
MCV: 86.8 fL (ref 80.0–100.0)
Platelets: 211 10*3/uL (ref 150–440)
RBC: 2.82 MIL/uL — ABNORMAL LOW (ref 3.80–5.20)
RDW: 20.1 % — ABNORMAL HIGH (ref 11.5–14.5)
WBC: 5.2 10*3/uL (ref 3.6–11.0)

## 2015-05-22 LAB — BASIC METABOLIC PANEL
Anion gap: 7 (ref 5–15)
BUN: <5 mg/dL — ABNORMAL LOW (ref 6–20)
CO2: 31 mmol/L (ref 22–32)
Calcium: 7.7 mg/dL — ABNORMAL LOW (ref 8.9–10.3)
Chloride: 103 mmol/L (ref 101–111)
Creatinine, Ser: 2.87 mg/dL — ABNORMAL HIGH (ref 0.44–1.00)
GFR calc Af Amer: 20 mL/min — ABNORMAL LOW (ref >60)
GFR calc non Af Amer: 17 mL/min — ABNORMAL LOW (ref >60)
Glucose, Bld: 73 mg/dL (ref 65–99)
Potassium: 3.8 mmol/L (ref 3.5–5.1)
Sodium: 141 mmol/L (ref 135–145)

## 2015-05-22 LAB — GLUCOSE, CAPILLARY
Glucose-Capillary: 98 mg/dL (ref 65–99)
Glucose-Capillary: 81 mg/dL (ref 65–99)
Glucose-Capillary: 94 mg/dL (ref 65–99)

## 2015-05-22 MED ORDER — EPOETIN ALFA 10000 UNIT/ML IJ SOLN
10000.0000 [IU] | INTRAMUSCULAR | Status: DC
  Administered 2015-05-23: 10000 [IU] via INTRAVENOUS

## 2015-05-22 MED ORDER — GUAIFENESIN-DM 100-10 MG/5ML PO SYRP: 10.0000 mL | ORAL_SOLUTION | Freq: Four times a day (QID) | ORAL | Status: DC | PRN

## 2015-05-22 NOTE — Clinical Documentation Improvement (Signed)
Hospitalist Internal Medicine  Can the diagnosis of CHF be further specified for this admission?    Acuity - Acute, Chronic, Acute on Chronic   Type - Systolic, Diastolic, Systolic and Diastolic  Other  Clinically Undetermined   Document any associated diagnoses/conditions   Supporting Information:  Per [id number]  H&P = diastolic CHF Per [id number] ED Provider note = Acute diastolic CHF (congestive heart failure) date noted  04/05/15.      Please exercise your independent, professional judgment when responding. A specific answer is not anticipated or expected.   Thank You,  Sonda Rumble RN, BSN, CCM Clinical Documentation Specialist Phone: [place]

## 2015-05-22 NOTE — Progress Notes (Signed)
Initial Nutrition Assessment       INTERVENTION:  Meals and snacks: Cater to pt preferences Nutritional Supplement Therapy: Pt reports can't tolerate supplements as cause diarrhea   NUTRITION DIAGNOSIS:   Inadequate oral intake related to altered GI function as evidenced by per patient/family report.    GOAL:   Patient will meet greater than or equal to 90% of their needs    MONITOR:    (Energy intake, Digestive system)  REASON FOR ASSESSMENT:   Other (Comment) (dialysis pt)    ASSESSMENT:      Pt admitted with nausea, abdominal pain, emesis and pneumonia  Past Medical History  Diagnosis Date  . Asthma   . Collagen vascular disease   . Diabetes mellitus without complication   . Hypertension   . Coronary artery disease     a. cath 2013: stenting to RCA (report not available); b. cath 2014: LM nl, pLAD 40%, mLAD nl, ost LCx 40%, mid LCx nl, pRCA 30% @ site of prior stent, mRCA 50%  . Diabetic neuropathy   . ESRD (end stage renal disease) on dialysis     M-W-F  . GERD (gastroesophageal reflux disease)   . Hx of pancreatitis 2015  . Myocardial infarction   . Pneumonia   . Mitral regurgitation     a. echo 10/2013: EF 62%, noWMA, mildly dilated LA, mild to mod MR/TR, GR1DD  . COPD (chronic obstructive pulmonary disease)   . Acute diastolic CHF (congestive heart failure) 04/05/2015  . dialysis 2006    Current Nutrition: ate 100% of lunch today (grilled chicken salad). Talking about food and reports appetite is improving  Food/Nutrition-Related History: Pt reports intake has been down for the past 2 weeks secondary to not feeling well   Medications: renvela, aspart, reglan, MVI, carafate  Electrolyte/Renal Profile and Glucose Profile:   Recent Labs Lab 05/20/15 1045 05/21/15 1613 05/22/15 0542  NA 139 138 141  K 2.8* 2.8* 3.8  CL 97* 98* 103  CO2 30 31 31   BUN 6 <5* <5*  CREATININE 3.62* 2.37* 2.87*  CALCIUM 8.5* 7.7* 7.7*  GLUCOSE 72 85 73    Protein Profile:  Recent Labs Lab 05/20/15 1045 05/21/15 1613  ALBUMIN 2.1* 2.1*    Gastrointestinal Profile: Last BM:8/30   Nutrition-Focused Physical Exam Findings: Nutrition-Focused physical exam completed. Findings are WDL for fat depletion, muscle depletion, with moderate edema.     Weight Change: Per wt encounters wt loss of 9% in the last 8 days.  Pt report typical dry wt of 89.5 kg    Diet Order:  Diet renal/carb modified with fluid restriction Diet-HS Snack?: Nothing; Room service appropriate?: Yes; Fluid consistency:: Thin  Skin:   reviewed   Height:   Ht Readings from Last 1 Encounters:  05/21/15 5\' 6"  (1.676 m)    Weight:   Wt Readings from Last 1 Encounters:  05/22/15 180 lb 14.4 oz (82.056 kg)    BMI:  Body mass index is 29.21 kg/(m^2).  Estimated Nutritional Needs:   Kcal:  BEE 1185 kcals (IF 1.0-1.3, aF 1.3) 1540-2002 kcals/d  Protein:  (1.2-1.5 g/kg) 71-89 g/d  Fluid:  (1067ml + UOP)  EDUCATION NEEDS:   No education needs identified at this time  [place] [facility]  Skyann Ganim B. Zenia Resides, [place], [place] (pager)

## 2015-05-22 NOTE — Progress Notes (Signed)
[facility] [place] NAME: [name]    MR#:  [id number]  DATE OF BIRTH:  [birth year]  SUBJECTIVE:  CHIEF COMPLAINT:   Chief Complaint  Patient presents with  . Abdominal Pain  . Nausea  . Emesis   -Patient with chronic pancreatitis and dialysis patient admitted for possible pneumonia. -Still complains of some difficulty breathing and coughing episodes. -Chronic nausea and diarrhea present -Had dialysis yesterday  REVIEW OF SYSTEMS:  Review of Systems  Constitutional: Negative for fever, chills and malaise/fatigue.  HENT: Negative for ear discharge, ear pain and nosebleeds.   Eyes: Negative for blurred vision.  Respiratory: Positive for cough and shortness of breath. Negative for sputum production and wheezing.   Cardiovascular: Negative for chest pain, palpitations and leg swelling.  Gastrointestinal: Positive for nausea, abdominal pain and diarrhea. Negative for vomiting and constipation.       Chronic abdominal pain nausea and diarrhea  Genitourinary: Negative for dysuria.       Incontinent. Still makes urine.  Musculoskeletal: Negative for back pain.  Skin: Negative for rash.  Neurological: Negative for dizziness, sensory change, speech change, focal weakness, seizures and headaches.  Psychiatric/Behavioral: Negative for depression.    DRUG ALLERGIES:   Allergies  Allergen Reactions  . Ace Inhibitors Swelling  . Ativan [Lorazepam] Other (See Comments)    Reaction:  Hallucinations and headaches  . Codeine Nausea And Vomiting  . Compazine [Prochlorperazine Edisylate] Nausea And Vomiting  . Gabapentin Other (See Comments)    Reaction:  Unknown   . Losartan Other (See Comments)    Reaction:  Unknown   . Phenergan [Promethazine Hcl] Other (See Comments)    Reaction:  Hallucinations  . Scopolamine Other (See Comments)    Reaction:  Unknown   . Zofran [Ondansetron Hcl] Other (See Comments)    Reaction:  Dementia   .  Oxycodone Anxiety  . Tape Rash    VITALS:  Blood pressure 129/58, pulse 70, temperature 98.2 F (36.8 C), temperature source Oral, resp. rate 17, height 5\' 6"  (1.676 m), weight 82.056 kg (180 lb 14.4 oz), SpO2 100 %.  PHYSICAL EXAMINATION:  Physical Exam  GENERAL:  54 y.o.-year-old patient lying in the bed with no acute distress.  EYES: Pupils equal, round, reactive to light and accommodation. No scleral icterus. Extraocular muscles intact.  HEENT: Head atraumatic, normocephalic. Oropharynx and nasopharynx clear.  NECK:  Supple, no jugular venous distention. No thyroid enlargement, no tenderness.  LUNGS: Normal breath sounds bilaterally, no wheezing, rales,rhonchi or crepitation. No use of accessory muscles of respiration. Decreased right basilar breath sounds CARDIOVASCULAR: S1, S2 normal. No murmurs, rubs, or gallops.  ABDOMEN: Soft, nontender, nondistended. Bowel sounds present. No organomegaly or mass.  EXTREMITIES: No pedal edema, cyanosis, or clubbing. Positive left upper extremity AV fistula with good bruit and good thrill Generalized Abdominal discomfort on palpation NEUROLOGIC: Cranial nerves II through XII are intact. Muscle strength 5/5 in all extremities. Sensation intact. Gait not checked.  PSYCHIATRIC: The patient is alert and oriented x 3.  SKIN: No obvious rash, lesion, or ulcer.    LABORATORY PANEL:   CBC  Recent Labs Lab 05/22/15 0542  WBC 5.2  HGB 8.1*  HCT 24.5*  PLT 211   ------------------------------------------------------------------------------------------------------------------  Chemistries   Recent Labs Lab 05/21/15 1613 05/22/15 0542  NA 138 141  K 2.8* 3.8  CL 98* 103  CO2 31 31  GLUCOSE 85 73  BUN <5* <5*  CREATININE 2.37* 2.87*  CALCIUM 7.7* 7.7*  AST 24  --   ALT 14  --   ALKPHOS 148*  --   BILITOT 0.8  --     ------------------------------------------------------------------------------------------------------------------  Cardiac Enzymes  Recent Labs Lab 05/21/15 1613  TROPONINI <0.03   ------------------------------------------------------------------------------------------------------------------  RADIOLOGY:  Dg Abd Acute W/chest  05/21/2015   CLINICAL DATA:  Intermittent nausea and vomiting for the past 3 months with epigastric pain and vomiting today, recent diagnosis of pneumonia  EXAM: DG ABDOMEN ACUTE W/ 1V CHEST  COMPARISON:  05/14/2015  FINDINGS: Mild cardiac enlargement. Mild vascular congestion and interstitial prominence. Small bilateral pleural effusions larger on the right. Underlying consolidation bilateral lung bases right greater than left.  No abnormally dilated loops of bowel. Air-fluid levels noted in scattered loops of small and large bowel. Status post cholecystectomy.  IMPRESSION: Nonobstructive bowel gas pattern. Similar appearance of the chest when compared to prior study again indicating bilateral pleural effusions with underlying consolidation. Stable cardiac enlargement and vascular congestion.   Electronically Signed   By: Skipper Cliche M.D.   On: 05/21/2015 18:14    EKG:   Orders placed or performed during the hospital encounter of 05/21/15  . EKG 12-Lead  . EKG 12-Lead    ASSESSMENT AND PLAN:   54 year old female with past medical history of end-stage renal disease on M-W-F hemodialysis, hypertension, diastolic CHF, secondary hyperparathyroidism, chronic pancreatitis, diabetic neuropathy, diabetes, diabetic gastroparesis who presents to the hospital due to pneumonia.  #1 Community Acquired pneumonia-follow-up blood cultures and sputum cultures -Continue vancomycin and Zosyn -Nebs as needed and also cough medicine   #2 end-stage renal disease on Monday, Wednesday, Friday hemodialysis- -Consult nephrology. -Last dialysis yesterday.  #3 generalized  weakness/difficulty walking-likely related to underlying pneumonia and deconditioning. -Pending physical therapy consult to assess mobility. -Patient has been wheelchair bound lately  #4 chronic pancreatitis-has chronic nausea and diarrhea. Continue her pancreatic supplements  #5 hypertension-continue Coreg, Norvasc  #6 hyperlipidemia-continue atorvastatin.  #7 diabetes type 2-patient was recently admitted for hypoglycemia is currently on just sliding scale insulin which will continue. -On Reglan for diabetic gastroparesis symptoms  #8 GERD-continue Protonix  #9 DVT prophylaxis-on subcutaneous heparin  All the records are reviewed and case discussed with Care Management/Social Workerr. Management plans discussed with the patient, family and they are in agreement.  CODE STATUS: Full code  TOTAL TIME TAKING CARE OF THIS PATIENT: 37 minutes.   POSSIBLE D/C IN 2 DAYS, DEPENDING ON CLINICAL CONDITION.   Alahni Varone M.D on 05/22/2015 at 2:45 PM  Between 7am to 6pm - Pager - [phone]  After 6pm go to [email or web address] - password [email or web address] [facility]  Office  [phone]  CC: Primary care physician; Ellamae Sia, MD

## 2015-05-22 NOTE — Progress Notes (Signed)
[facility]  ROUNDING NOTE   Subjective:  Pt readmitted with nausea, vomiting, abdominal pain and found to have nosocomial pneumonia. Had HD yesterday which she completed.   Has recent recurrent pneumonia. Has also been evaluated by GI for recurrent nausea, vomiting, abdominal pain in the past.   Objective:  Vital signs in last 24 hours:  Temp:  [97.7 F (36.5 C)-98.2 F (36.8 C)] 98.2 F (36.8 C) (08/30 0810) Pulse Rate:  [70-81] 70 (08/30 0810) Resp:  [12-18] 17 (08/30 0810) BP: (129-167)/(52-59) 129/58 mmHg (08/30 0810) SpO2:  [91 %-100 %] 100 % (08/30 0810) Weight:  [81.874 kg (180 lb 8 oz)-83.462 kg (184 lb)] 82.056 kg (180 lb 14.4 oz) (08/30 0604)  Weight change:  Filed Weights   05/21/15 1553 05/21/15 2148 05/22/15 0604  Weight: 83.462 kg (184 lb) 81.874 kg (180 lb 8 oz) 82.056 kg (180 lb 14.4 oz)    Intake/Output: I/O last 3 completed shifts: In: 240 [P.O.:240] Out: 0    Intake/Output this shift:  Total I/O In: 360 [P.O.:360] Out: 0   Physical Exam: General: NAD, resting in bed  Head: Alopecia noted, atraumatic. Moist oral mucosal membranes  Eyes: Anicteric  Neck: Supple, trachea midline  Lungs:  Clear to auscultation normal effort  Heart: S1S2 no rubs  Abdomen:  Soft, nontender, BS present  Extremities:  1+ peripheral edema., 1+ right arm swelling.  Neurologic: Awake, alert, follows commands  Skin: No lesions  Access: LUE AVG    Basic Metabolic Panel:  Recent Labs Lab 05/20/15 1045 05/21/15 1613 05/22/15 0542  NA 139 138 141  K 2.8* 2.8* 3.8  CL 97* 98* 103  CO2 30 31 31   GLUCOSE 72 85 73  BUN 6 <5* <5*  CREATININE 3.62* 2.37* 2.87*  CALCIUM 8.5* 7.7* 7.7*    Liver Function Tests:  Recent Labs Lab 05/20/15 1045 05/21/15 1613  AST 24 24  ALT 14 14  ALKPHOS 149* 148*  BILITOT 1.0 0.8  PROT 6.3* 6.1*  ALBUMIN 2.1* 2.1*    Recent Labs Lab 05/20/15 1045 05/21/15 1613  LIPASE <10* <10*   No results for input(s):  AMMONIA in the last 168 hours.  CBC:  Recent Labs Lab 05/20/15 1233 05/21/15 1613 05/22/15 0542  WBC 4.5 4.5 5.2  NEUTROABS 2.3  --   --   HGB 8.9* 8.7* 8.1*  HCT 27.0* 26.3* 24.5*  MCV 86.2 86.0 86.8  PLT 239 226 211    Cardiac Enzymes:  Recent Labs Lab 05/20/15 1045 05/21/15 1613  TROPONINI <0.03 <0.03    BNP: Invalid input(s): POCBNP  CBG:  Recent Labs Lab 05/21/15 2215 05/21/15 2249 05/21/15 2323 05/22/15 0808 05/22/15 1121  GLUCAP 60* 64* 88 98 81    Microbiology: Results for orders placed or performed during the hospital encounter of 04/24/15  MRSA PCR Screening     Status: None   Collection Time: 04/24/15  9:00 AM  Result Value Ref Range Status   MRSA by PCR NEGATIVE NEGATIVE Final    Comment:        The GeneXpert MRSA Assay (FDA approved for NASAL specimens only), is one component of a comprehensive MRSA colonization surveillance program. It is not intended to diagnose MRSA infection nor to guide or monitor treatment for MRSA infections.     Coagulation Studies: No results for input(s): LABPROT, INR in the last 72 hours.  Urinalysis: No results for input(s): COLORURINE, LABSPEC, PHURINE, GLUCOSEU, HGBUR, BILIRUBINUR, KETONESUR, PROTEINUR, UROBILINOGEN, NITRITE, LEUKOCYTESUR in the last  72 hours.  Invalid input(s): APPERANCEUR    Imaging: Dg Abd Acute W/chest  05/21/2015   CLINICAL DATA:  Intermittent nausea and vomiting for the past 3 months with epigastric pain and vomiting today, recent diagnosis of pneumonia  EXAM: DG ABDOMEN ACUTE W/ 1V CHEST  COMPARISON:  05/14/2015  FINDINGS: Mild cardiac enlargement. Mild vascular congestion and interstitial prominence. Small bilateral pleural effusions larger on the right. Underlying consolidation bilateral lung bases right greater than left.  No abnormally dilated loops of bowel. Air-fluid levels noted in scattered loops of small and large bowel. Status post cholecystectomy.  IMPRESSION:  Nonobstructive bowel gas pattern. Similar appearance of the chest when compared to prior study again indicating bilateral pleural effusions with underlying consolidation. Stable cardiac enlargement and vascular congestion.   Electronically Signed   By: Skipper Cliche M.D.   On: 05/21/2015 18:14     Medications:     . amLODipine  10 mg Oral Daily  . aspirin EC  81 mg Oral Daily  . atorvastatin  20 mg Oral QPM  . carvedilol  25 mg Oral BID WC  . cholecalciferol  400 Units Oral Daily  . citalopram  20 mg Oral Daily  . clopidogrel  75 mg Oral Daily  . famotidine  20 mg Oral Daily  . heparin  5,000 Units Subcutaneous 3 times per day  . Influenza vac split quadrivalent PF  0.5 mL Intramuscular Tomorrow-1000  . lipase/protease/amylase  36,000 Units Oral TID WC  . loratadine  10 mg Oral Daily  . metoCLOPramide  5 mg Oral TID AC & HS  . multivitamin  1 tablet Oral Daily  . pantoprazole  40 mg Oral QAC breakfast  . piperacillin-tazobactam (ZOSYN)  IV  3.375 g Intravenous Q12H  . ranolazine  1,000 mg Oral BID  . sevelamer carbonate  800 mg Oral TID WC  . sucralfate  1 g Oral TID WC & HS  . [START ON 05/23/2015] vancomycin  750 mg Intravenous Q M,W,F-HD   acetaminophen **OR** acetaminophen, albuterol, guaiFENesin-dextromethorphan, HYDROcodone-acetaminophen, insulin aspart, nitroGLYCERIN, ondansetron **OR** ondansetron (ZOFRAN) IV, promethazine  Assessment/ Plan:  54 y.o. female with ESRD, DM (30 yrs with retinopathy, gastroparesis, neuropathy), OA, IBS, hyperlipidemia, h/o GI bleed, SHPTH, AOCD, diastolic heart failure ,hypertension, hyperlipidemia, anemia, CAD  Admitted 05/22/15 for nosocomial pneumonia, has history of tobacco abuse.  1.  ESRD on HD on MWF:  Had HD yesterday, electrolytes acceptable, no acute indication for HD at present.  Will plan to perform HD tomorrow.  2.  Bacterial pneumonia:  Has been in the hospital within the past 30 days, also is a dialysis pt therefore considered  to be nosocomial.  Continue zosyn and vancomycin.  Advised pt to stop smoking as well.  3.  SHPTH:  Check ipth/phos with HD tomorrow, continue renvela at this time.  4.  Anemia of CKD: hgb 8.1, will start epogen 10000 units IV with HD.   LOS: 1 [name] 8/30/20161:31 PM

## 2015-05-23 LAB — BASIC METABOLIC PANEL
Anion gap: 5 (ref 5–15)
BUN: 7 mg/dL (ref 6–20)
CO2: 32 mmol/L (ref 22–32)
Calcium: 8.2 mg/dL — ABNORMAL LOW (ref 8.9–10.3)
Chloride: 101 mmol/L (ref 101–111)
Creatinine, Ser: 3.73 mg/dL — ABNORMAL HIGH (ref 0.44–1.00)
GFR calc Af Amer: 15 mL/min — ABNORMAL LOW (ref >60)
GFR calc non Af Amer: 13 mL/min — ABNORMAL LOW (ref >60)
Glucose, Bld: 67 mg/dL (ref 65–99)
Potassium: 3 mmol/L — ABNORMAL LOW (ref 3.5–5.1)
Sodium: 138 mmol/L (ref 135–145)

## 2015-05-23 LAB — GLUCOSE, CAPILLARY
Glucose-Capillary: 62 mg/dL — ABNORMAL LOW (ref 65–99)
Glucose-Capillary: 72 mg/dL (ref 65–99)
Glucose-Capillary: 78 mg/dL (ref 65–99)
Glucose-Capillary: 73 mg/dL (ref 65–99)

## 2015-05-23 LAB — PHOSPHORUS: Phosphorus: 2 mg/dL — ABNORMAL LOW (ref 2.5–4.6)

## 2015-05-23 MED ORDER — NEPRO/CARBSTEADY PO LIQD: 237.0000 mL | ORAL | Status: DC | PRN

## 2015-05-23 MED ORDER — LIDOCAINE HCL (PF) 1 % IJ SOLN
5.0000 mL | INTRAMUSCULAR | Status: DC | PRN
  Filled 2015-05-23: qty 5

## 2015-05-23 MED ORDER — PENTAFLUOROPROP-TETRAFLUOROETH EX AERO: 1.0000 "application " | INHALATION_SPRAY | CUTANEOUS | Status: DC | PRN

## 2015-05-23 MED ORDER — SODIUM CHLORIDE 0.9 % IV SOLN: 100.0000 mL | INTRAVENOUS | Status: DC | PRN

## 2015-05-23 MED ORDER — LIDOCAINE-PRILOCAINE 2.5-2.5 % EX CREA: 1.0000 "application " | TOPICAL_CREAM | CUTANEOUS | Status: DC | PRN

## 2015-05-23 MED ORDER — HEPARIN SODIUM (PORCINE) 1000 UNIT/ML DIALYSIS: 1000.0000 [IU] | INTRAMUSCULAR | Status: DC | PRN

## 2015-05-23 MED ORDER — ALTEPLASE 2 MG IJ SOLR: 2.0000 mg | Freq: Once | INTRAMUSCULAR | Status: DC | PRN

## 2015-05-23 NOTE — Progress Notes (Signed)
HD tx completed.

## 2015-05-23 NOTE — Progress Notes (Signed)
Post hd tx 

## 2015-05-23 NOTE — Progress Notes (Signed)
[facility]  ROUNDING NOTE   Subjective:  Pt seen at bedside, Had HD today. Still having some nausea. Being treated for pneumonia.   Objective:  Vital signs in last 24 hours:  Temp:  [97.7 F (36.5 C)-98.2 F (36.8 C)] 98 F (36.7 C) (08/31 1430) Pulse Rate:  [57-75] 73 (08/31 1430) Resp:  [11-18] 18 (08/31 1430) BP: (125-159)/(42-65) 138/50 mmHg (08/31 1430) SpO2:  [92 %-100 %] 95 % (08/31 1430) Weight:  [83.2 kg (183 lb 6.8 oz)-84 kg (185 lb 3 oz)] 83.2 kg (183 lb 6.8 oz) (08/31 1324)  Weight change:  Filed Weights   05/22/15 0604 05/23/15 0918 05/23/15 1324  Weight: 82.056 kg (180 lb 14.4 oz) 84 kg (185 lb 3 oz) 83.2 kg (183 lb 6.8 oz)    Intake/Output: I/O last 3 completed shifts: In: 81 [P.O.:840; IV Piggyback:50] Out: 0    Intake/Output this shift:  Total I/O In: 35 [IV Piggyback:35] Out: 1000 [Other:1000]  Physical Exam: General: NAD, resting in bed  Head: Alopecia noted, atraumatic. Moist oral mucosal membranes  Eyes: Anicteric  Neck: Supple, trachea midline  Lungs:  Clear to auscultation normal effort  Heart: S1S2 no rubs  Abdomen:  Soft, nontender, BS present  Extremities:  1+ peripheral edema., 1+ right arm swelling.  Neurologic: Awake, alert, follows commands  Skin: No lesions  Access: LUE AVG    Basic Metabolic Panel:  Recent Labs Lab 05/20/15 1045 05/21/15 1613 05/22/15 0542 05/23/15 0414  NA 139 138 141 138  K 2.8* 2.8* 3.8 3.0*  CL 97* 98* 103 101  CO2 30 31 31  32  GLUCOSE 72 85 73 67  BUN 6 <5* <5* 7  CREATININE 3.62* 2.37* 2.87* 3.73*  CALCIUM 8.5* 7.7* 7.7* 8.2*    Liver Function Tests:  Recent Labs Lab 05/20/15 1045 05/21/15 1613  AST 24 24  ALT 14 14  ALKPHOS 149* 148*  BILITOT 1.0 0.8  PROT 6.3* 6.1*  ALBUMIN 2.1* 2.1*    Recent Labs Lab 05/20/15 1045 05/21/15 1613  LIPASE <10* <10*   No results for input(s): AMMONIA in the last 168 hours.  CBC:  Recent Labs Lab 05/20/15 1233  05/21/15 1613 05/22/15 0542  WBC 4.5 4.5 5.2  NEUTROABS 2.3  --   --   HGB 8.9* 8.7* 8.1*  HCT 27.0* 26.3* 24.5*  MCV 86.2 86.0 86.8  PLT 239 226 211    Cardiac Enzymes:  Recent Labs Lab 05/20/15 1045 05/21/15 1613  TROPONINI <0.03 <0.03    BNP: Invalid input(s): POCBNP  CBG:  Recent Labs Lab 05/22/15 0808 05/22/15 1121 05/22/15 1707 05/23/15 0747 05/23/15 0840  GLUCAP 98 81 94 62* 54    Microbiology: Results for orders placed or performed during the hospital encounter of 05/21/15  Blood culture (routine x 2)     Status: None (Preliminary result)   Collection Time: 05/21/15  8:50 PM  Result Value Ref Range Status   Specimen Description BLOOD RIGHT ARM  Final   Special Requests BOTTLES DRAWN AEROBIC AND ANAEROBIC [place]  Final   Culture NO GROWTH 2 DAYS  Final   Report Status PENDING  Incomplete  Blood culture (routine x 2)     Status: None (Preliminary result)   Collection Time: 05/21/15  8:52 PM  Result Value Ref Range Status   Specimen Description BLOOD RIGHT ARM  Final   Special Requests BOTTLES DRAWN AEROBIC AND ANAEROBIC 6CC  Final   Culture NO GROWTH 2 DAYS  Final  Report Status PENDING  Incomplete    Coagulation Studies: No results for input(s): LABPROT, INR in the last 72 hours.  Urinalysis: No results for input(s): COLORURINE, LABSPEC, PHURINE, GLUCOSEU, HGBUR, BILIRUBINUR, KETONESUR, PROTEINUR, UROBILINOGEN, NITRITE, LEUKOCYTESUR in the last 72 hours.  Invalid input(s): APPERANCEUR    Imaging: Dg Abd Acute W/chest  05/21/2015   CLINICAL DATA:  Intermittent nausea and vomiting for the past 3 months with epigastric pain and vomiting today, recent diagnosis of pneumonia  EXAM: DG ABDOMEN ACUTE W/ 1V CHEST  COMPARISON:  05/14/2015  FINDINGS: Mild cardiac enlargement. Mild vascular congestion and interstitial prominence. Small bilateral pleural effusions larger on the right. Underlying consolidation bilateral lung bases right greater than left.  No  abnormally dilated loops of bowel. Air-fluid levels noted in scattered loops of small and large bowel. Status post cholecystectomy.  IMPRESSION: Nonobstructive bowel gas pattern. Similar appearance of the chest when compared to prior study again indicating bilateral pleural effusions with underlying consolidation. Stable cardiac enlargement and vascular congestion.   Electronically Signed   By: Skipper Cliche M.D.   On: 05/21/2015 18:14     Medications:     . amLODipine  10 mg Oral Daily  . aspirin EC  81 mg Oral Daily  . atorvastatin  20 mg Oral QPM  . carvedilol  25 mg Oral BID WC  . cholecalciferol  400 Units Oral Daily  . citalopram  20 mg Oral Daily  . clopidogrel  75 mg Oral Daily  . epoetin (EPOGEN/PROCRIT) injection  10,000 Units Intravenous Q M,W,F-HD  . famotidine  20 mg Oral Daily  . heparin  5,000 Units Subcutaneous 3 times per day  . lipase/protease/amylase  36,000 Units Oral TID WC  . loratadine  10 mg Oral Daily  . metoCLOPramide  5 mg Oral TID AC & HS  . multivitamin  1 tablet Oral Daily  . pantoprazole  40 mg Oral QAC breakfast  . piperacillin-tazobactam (ZOSYN)  IV  3.375 g Intravenous Q12H  . ranolazine  1,000 mg Oral BID  . sevelamer carbonate  800 mg Oral TID WC  . sucralfate  1 g Oral TID WC & HS  . vancomycin  750 mg Intravenous Q M,W,F-HD   sodium chloride, sodium chloride, acetaminophen **OR** acetaminophen, albuterol, alteplase, feeding supplement (NEPRO CARB STEADY), guaiFENesin-dextromethorphan, heparin, HYDROcodone-acetaminophen, insulin aspart, lidocaine (PF), lidocaine-prilocaine, nitroGLYCERIN, ondansetron **OR** ondansetron (ZOFRAN) IV, pentafluoroprop-tetrafluoroeth, promethazine  Assessment/ Plan:  54 y.o. female with ESRD, DM (30 yrs with retinopathy, gastroparesis, neuropathy), OA, IBS, hyperlipidemia, h/o GI bleed, SHPTH, AOCD, diastolic heart failure ,hypertension, hyperlipidemia, anemia, CAD  Admitted 05/22/15 for nosocomial pneumonia, has  history of tobacco abuse.  1.  ESRD on HD on MWF:  Pt had HD today, will plan for next HD on Friday.  2.  Bacterial pneumonia:  Nosocomial in nature.   -Continue zosyn and vancomycin.  3.  SHPTH: continue renvela 800mg  po tid/wm.  4.  Anemia of CKD: hgb 8.1, continue epogen.   LOS: 2 Camry Theiss 8/31/20163:00 PM

## 2015-05-23 NOTE — Progress Notes (Signed)
This note also relates to the following rows which could not be included: Pulse Rate - Cannot attach notes to unvalidated device data Resp - Cannot attach notes to unvalidated device data BP - Cannot attach notes to unvalidated device data SpO2 - Cannot attach notes to unvalidated device data   HD tx start

## 2015-05-23 NOTE — Progress Notes (Signed)
Pre-hd tx 

## 2015-05-23 NOTE — Progress Notes (Signed)
[facility] [place] NAME: [name]    MR#:  [id number]  DATE OF BIRTH:  1961/02/21  SUBJECTIVE:  CHIEF COMPLAINT:   Chief Complaint  Patient presents with  . Abdominal Pain  . Nausea  . Emesis   -Patient with chronic pancreatitis and dialysis patient admitted for possible pneumonia. -Still complains of some difficulty breathing and coughing episodes. -Chronic nausea and diarrhea present -Had dialysis yesterday  REVIEW OF SYSTEMS:  Review of Systems  Constitutional: Negative for fever, chills and malaise/fatigue.  HENT: Negative for ear discharge, ear pain and nosebleeds.   Eyes: Negative for blurred vision.  Respiratory: Positive for cough. Negative for sputum production, shortness of breath and wheezing.   Cardiovascular: Negative for chest pain, palpitations and leg swelling.  Gastrointestinal: Positive for diarrhea. Negative for nausea, vomiting, abdominal pain and constipation.       Chronic abdominal pain nausea and diarrhea  Genitourinary: Negative for dysuria.       Incontinent. Still makes urine.  Musculoskeletal: Negative for back pain.  Skin: Negative for rash.  Neurological: Negative for dizziness, sensory change, speech change, focal weakness, seizures and headaches.  Psychiatric/Behavioral: Negative for depression.    DRUG ALLERGIES:   Allergies  Allergen Reactions  . Ace Inhibitors Swelling  . Ativan [Lorazepam] Other (See Comments)    Reaction:  Hallucinations and headaches  . Codeine Nausea And Vomiting  . Compazine [Prochlorperazine Edisylate] Nausea And Vomiting  . Gabapentin Other (See Comments)    Reaction:  Unknown   . Losartan Other (See Comments)    Reaction:  Unknown   . Phenergan [Promethazine Hcl] Other (See Comments)    Reaction:  Hallucinations  . Scopolamine Other (See Comments)    Reaction:  Unknown   . Zofran [Ondansetron Hcl] Other (See Comments)    Reaction:  Dementia   .  Oxycodone Anxiety  . Tape Rash    VITALS:  Blood pressure 138/50, pulse 73, temperature 98 F (36.7 C), temperature source Oral, resp. rate 18, height 5\' 6"  (1.676 m), weight 83.2 kg (183 lb 6.8 oz), SpO2 95 %.  PHYSICAL EXAMINATION:  Physical Exam  GENERAL:  54 y.o.-year-old patient lying in the bed with no acute distress.  EYES: Pupils equal, round, reactive to light and accommodation. No scleral icterus. Extraocular muscles intact.  HEENT: Head atraumatic, normocephalic. Oropharynx and nasopharynx clear.  NECK:  Supple, no jugular venous distention. No thyroid enlargement, no tenderness.  LUNGS: Normal breath sounds bilaterally, no wheezing, rales,rhonchi or crepitation. No use of accessory muscles of respiration. Decreased right basilar breath sounds CARDIOVASCULAR: S1, S2 normal. No murmurs, rubs, or gallops.  ABDOMEN: Soft, nontender, nondistended. Bowel sounds present. No organomegaly or mass.  EXTREMITIES: No pedal edema, cyanosis, or clubbing. Positive left upper extremity AV fistula with good bruit and good thrill.  Generalized Abdominal discomfort on palpation NEUROLOGIC: Cranial nerves II through XII are intact. Muscle strength 5/5 in all extremities. Sensation intact. Gait not checked.  PSYCHIATRIC: The patient is alert and oriented x 3.  SKIN: No obvious rash, lesion, or ulcer.    LABORATORY PANEL:   CBC  Recent Labs Lab 05/22/15 0542  WBC 5.2  HGB 8.1*  HCT 24.5*  PLT 211   ------------------------------------------------------------------------------------------------------------------  Chemistries   Recent Labs Lab 05/21/15 1613  05/23/15 0414  NA 138  < > 138  K 2.8*  < > 3.0*  CL 98*  < > 101  CO2 31  < > 32  GLUCOSE 85  < > 67  BUN <5*  < > 7  CREATININE 2.37*  < > 3.73*  CALCIUM 7.7*  < > 8.2*  AST 24  --   --   ALT 14  --   --   ALKPHOS 148*  --   --   BILITOT 0.8  --   --   < > = values in this interval not  displayed. ------------------------------------------------------------------------------------------------------------------  Cardiac Enzymes  Recent Labs Lab 05/21/15 1613  TROPONINI <0.03   ------------------------------------------------------------------------------------------------------------------  RADIOLOGY:  Dg Abd Acute W/chest  05/21/2015   CLINICAL DATA:  Intermittent nausea and vomiting for the past 3 months with epigastric pain and vomiting today, recent diagnosis of pneumonia  EXAM: DG ABDOMEN ACUTE W/ 1V CHEST  COMPARISON:  05/14/2015  FINDINGS: Mild cardiac enlargement. Mild vascular congestion and interstitial prominence. Small bilateral pleural effusions larger on the right. Underlying consolidation bilateral lung bases right greater than left.  No abnormally dilated loops of bowel. Air-fluid levels noted in scattered loops of small and large bowel. Status post cholecystectomy.  IMPRESSION: Nonobstructive bowel gas pattern. Similar appearance of the chest when compared to prior study again indicating bilateral pleural effusions with underlying consolidation. Stable cardiac enlargement and vascular congestion.   Electronically Signed   By: Skipper Cliche M.D.   On: 05/21/2015 18:14    EKG:   Orders placed or performed during the hospital encounter of 05/21/15  . EKG 12-Lead  . EKG 12-Lead    ASSESSMENT AND PLAN:   54 year old female with past medical history of end-stage renal disease on M-W-F hemodialysis, hypertension, diastolic CHF, secondary hyperparathyroidism, chronic pancreatitis, diabetic neuropathy, diabetes, diabetic gastroparesis who presents to the hospital due to pneumonia.  #1 Community Acquired pneumonia-follow-up blood cultures and sputum cultures -Continue vancomycin and Zosyn -Nebs as needed and also cough medicine   #2 end-stage renal disease on Monday, Wednesday, Friday hemodialysis- -Consult nephrology. -Last dialysis today.  #3  generalized weakness/difficulty walking-likely related to underlying pneumonia and deconditioning. -Pending physical therapy consult to assess mobility. -Patient has been wheelchair bound lately  #4 chronic pancreatitis-has chronic nausea and diarrhea. Continue her pancreatic supplements  #5 hypertension-continue Coreg, Norvasc  #6 hyperlipidemia-continue atorvastatin.  #7 diabetes type 2-patient was recently admitted for hypoglycemia is currently on just sliding scale insulin which will continue. -On Reglan for diabetic gastroparesis symptoms -Outpatient GI follow-up  #8 GERD-continue Protonix  #9 DVT prophylaxis-on subcutaneous heparin  All the records are reviewed and case discussed with Care Management/Social Workerr. Management plans discussed with the patient, family and they are in agreement.  CODE STATUS: Full code  TOTAL TIME TAKING CARE OF THIS PATIENT: 37 minutes.   POSSIBLE D/C TOMORROW, DEPENDING ON CLINICAL CONDITION.   Hazael Olveda M.D on 05/23/2015 at 3:10 PM  Between 7am to 6pm - Pager - [phone]  After 6pm go to [email or web address] - password [email or web address] [facility]  Office  [phone]  CC: Primary care physician; Ellamae Sia, MD

## 2015-05-23 NOTE — Care Management Important Message (Signed)
Important Message  Patient Details  Name: [name] MRN: [id number] Date of Birth: 05-Jan-1961   Medicare Important Message Given:  Yes-second notification given    [name] A Allmond 05/23/2015, 11:11 AM

## 2015-05-24 ENCOUNTER — Encounter: Payer: Self-pay | Admitting: Internal Medicine

## 2015-05-24 LAB — GLUCOSE, CAPILLARY
Glucose-Capillary: 56 mg/dL — ABNORMAL LOW (ref 65–99)
Glucose-Capillary: 118 mg/dL — ABNORMAL HIGH (ref 65–99)

## 2015-05-24 LAB — HEPATITIS B SURFACE ANTIGEN: Hepatitis B Surface Ag: NEGATIVE

## 2015-05-24 LAB — PARATHYROID HORMONE, INTACT (NO CA): PTH: 22 pg/mL (ref 15–65)

## 2015-05-24 MED ORDER — GUAIFENESIN-DM 100-10 MG/5ML PO SYRP: 10.0000 mL | ORAL_SOLUTION | Freq: Four times a day (QID) | ORAL | Status: DC | PRN

## 2015-05-24 MED ORDER — PROMETHAZINE HCL 25 MG/ML IJ SOLN: 12.5000 mg | Freq: Four times a day (QID) | INTRAMUSCULAR | Status: DC | PRN

## 2015-05-24 MED ORDER — LEVOFLOXACIN 750 MG PO TABS: 750.0000 mg | ORAL_TABLET | ORAL | Status: AC

## 2015-05-24 NOTE — Progress Notes (Signed)
Per MD Lateef ok to order phenergan and DC zofran, pt states she is allergic to zofran, not phenergan

## 2015-05-24 NOTE — Discharge Summary (Signed)
[facility] NAME: [name]    MR#:  [id number]  DATE OF BIRTH:  1961-09-05  DATE OF ADMISSION:  05/21/2015 ADMITTING PHYSICIAN: Henreitta Leber, MD  DATE OF DISCHARGE: 05/24/2015  PRIMARY CARE PHYSICIAN: Ellamae Sia, MD    ADMISSION DIAGNOSIS:  Gastroparesis [K31.84] HCAP (healthcare-associated pneumonia) [J18.9] Abdominal pain [R10.9]  DISCHARGE DIAGNOSIS:  Active Problems:   Pneumonia   SECONDARY DIAGNOSIS:   Past Medical History  Diagnosis Date  . Asthma   . Collagen vascular disease   . Diabetes mellitus without complication   . Hypertension   . Coronary artery disease     a. cath 2013: stenting to RCA (report not available); b. cath 2014: LM nl, pLAD 40%, mLAD nl, ost LCx 40%, mid LCx nl, pRCA 30% @ site of prior stent, mRCA 50%  . Diabetic neuropathy   . ESRD (end stage renal disease) on dialysis     M-W-F  . GERD (gastroesophageal reflux disease)   . Hx of pancreatitis 2015  . Myocardial infarction   . Pneumonia   . Mitral regurgitation     a. echo 10/2013: EF 62%, noWMA, mildly dilated LA, mild to mod MR/TR, GR1DD  . COPD (chronic obstructive pulmonary disease)   . chronic diastolic CHF [id number]  . dialysis 2006    HOSPITAL COURSE:   54 year old female with past medical history of end-stage renal disease on M-W-F hemodialysis, hypertension, diastolic CHF, secondary hyperparathyroidism, chronic pancreatitis, diabetic neuropathy, diabetes, diabetic gastroparesis who presents to the hospital due to pneumonia.  #1 Community Acquired pneumonia-negative blood cultures  -Change antibiotics to Levaquin. -Nebs as needed and also cough medicine   #2 end-stage renal disease on Monday, Wednesday, Friday hemodialysis- -Consult nephrology. -Had dialysis yesterday. Next dialysis tomorrow per schedule. Patient continues to void..  #3 generalized weakness/difficulty walking-likely related to underlying  pneumonia and deconditioning. Chronic illness and depression likely -Patient has been wheelchair bound lately  #4 chronic pancreatitis-has chronic nausea and diarrhea. Continue her pancreatic supplements -Patient GI follow up. No Zofran as patient is allergic to it  #5 hypertension-continue Coreg, Norvasc  #6 hyperlipidemia-continue atorvastatin.  #7 diabetes type 2-patient was recently admitted for hypoglycemia is currently on just sliding scale insulin which will continue. Sugars 56 this morning but had a very good breakfast. -On Reglan for diabetic gastroparesis symptoms -Outpatient GI follow-up  #8 GERD-continue Protonix and carafate  Chronically ill. Complaints of multiple aches everywhere. No acute symptoms at this time. Can be managed as outpatient. Will be discharged today.  DISCHARGE CONDITIONS:   Stable, guarded long-term prognosis  CONSULTS OBTAINED:  Treatment Team:  Anthonette Legato, MD  DRUG ALLERGIES:   Allergies  Allergen Reactions  . Ace Inhibitors Swelling  . Ativan [Lorazepam] Other (See Comments)    Reaction:  Hallucinations and headaches  . Codeine Nausea And Vomiting  . Compazine [Prochlorperazine Edisylate] Nausea And Vomiting  . Gabapentin Other (See Comments)    Reaction:  Unknown   . Losartan Other (See Comments)    Reaction:  Unknown   . Scopolamine Other (See Comments)    Reaction:  Unknown   . Zofran [Ondansetron Hcl] Other (See Comments)    Reaction:  hallucinations   . Oxycodone Anxiety  . Tape Rash    DISCHARGE MEDICATIONS:   Current Discharge Medication List    START taking these medications   Details  guaiFENesin-dextromethorphan (ROBITUSSIN DM) 100-10 MG/5ML syrup Take 10 mLs by mouth every 6 (six)  hours as needed for cough. Qty: 118 mL, Refills: 0    levofloxacin (LEVAQUIN) 750 MG tablet Take 1 tablet (750 mg total) by mouth 3 (three) times a week. On Monday, Wednesday and Friday at bedtime (after dialysis only) Qty: 4 tablet,  Refills: 0      CONTINUE these medications which have NOT CHANGED   Details  albuterol (PROVENTIL) (2.5 MG/3ML) 0.083% nebulizer solution Take 2.5 mg by nebulization every 4 (four) hours as needed for wheezing or shortness of breath.    amLODipine (NORVASC) 10 MG tablet Take 10 mg by mouth daily.    aspirin EC 81 MG tablet Take 81 mg by mouth daily.    atorvastatin (LIPITOR) 20 MG tablet Take 20 mg by mouth every evening.    B Complex-C-Biotin-E-FA (RENATABS PO) Take 1 tablet by mouth daily.    carvedilol (COREG) 25 MG tablet Take 25 mg by mouth 2 (two) times daily.    cetirizine (ZYRTEC) 10 MG tablet Take 10 mg by mouth daily.    citalopram (CELEXA) 20 MG tablet Take 20 mg by mouth daily.    clopidogrel (PLAVIX) 75 MG tablet Take 75 mg by mouth daily.    HYDROcodone-acetaminophen (NORCO/VICODIN) 5-325 MG per tablet Take 1 tablet by mouth 3 (three) times daily as needed for moderate pain or severe pain.    insulin aspart (NOVOLOG) 100 UNIT/ML injection Inject 2-10 Units into the skin 3 (three) times daily as needed for high blood sugar. Pt uses as needed per sliding scale:    151-200:  2 units  201-250:  4 units 251-300:  6 units 301-350:  8 units  351-401:  10 units and notify MD.    lipase/protease/amylase (CREON) 12000 UNITS CPEP capsule Take 36,000 Units by mouth 3 (three) times daily with meals.     metoCLOPramide (REGLAN) 5 MG tablet Take 1 tablet (5 mg total) by mouth 4 (four) times daily -  before meals and at bedtime. Qty: 40 tablet, Refills: 0    nitroGLYCERIN (NITROSTAT) 0.4 MG SL tablet Place 0.4 mg under the tongue every 5 (five) minutes x 3 doses as needed for chest pain.     pantoprazole (PROTONIX) 40 MG tablet Take 40 mg by mouth daily.    promethazine (PHENERGAN) 12.5 MG tablet Take 12.5 mg by mouth every 6 (six) hours as needed for nausea or vomiting.    ranitidine (ZANTAC) 150 MG tablet Take 150 mg by mouth daily.     ranolazine (RANEXA) 1000 MG SR  tablet Take 1,000 mg by mouth 2 (two) times daily.    sevelamer carbonate (RENVELA) 800 MG tablet Take 800 mg by mouth 3 (three) times daily with meals.     sucralfate (CARAFATE) 1 G tablet Take 1 tablet (1 g total) by mouth 4 (four) times daily -  with meals and at bedtime. Qty: 120 tablet, Refills: 0    Vitamin D, Cholecalciferol, 400 UNITS TABS Take 400 Units by mouth daily.     albuterol (PROVENTIL HFA;VENTOLIN HFA) 108 (90 BASE) MCG/ACT inhaler Inhale 4-6 puffs by mouth every 4 hours as needed for wheezing, cough, and/or shortness of breath Qty: 1 Inhaler, Refills: 1      STOP taking these medications     diazepam (VALIUM) 5 MG tablet      docusate sodium (COLACE) 100 MG capsule      polyethylene glycol (MIRALAX / GLYCOLAX) packet      senna (SENOKOT) 8.6 MG TABS tablet  DISCHARGE INSTRUCTIONS:   1. Follow-up for dialysis tomorrow 2. Follow up with Dr. Vira Agar in one week 3. Follow-up with PCP in one week  If you experience worsening of your admission symptoms, develop shortness of breath, life threatening emergency, suicidal or homicidal thoughts you must seek medical attention immediately by calling 911 or calling your MD immediately  if symptoms less severe.  You Must read complete instructions/literature along with all the possible adverse reactions/side effects for all the Medicines you take and that have been prescribed to you. Take any new Medicines after you have completely understood and accept all the possible adverse reactions/side effects.   Please note  You were cared for by a hospitalist during your hospital stay. If you have any questions about your discharge medications or the care you received while you were in the hospital after you are discharged, you can call the unit and asked to speak with the hospitalist on call if the hospitalist that took care of you is not available. Once you are discharged, your primary care physician will handle any  further medical issues. Please note that NO REFILLS for any discharge medications will be authorized once you are discharged, as it is imperative that you return to your primary care physician (or establish a relationship with a primary care physician if you do not have one) for your aftercare needs so that they can reassess your need for medications and monitor your lab values.    Today   CHIEF COMPLAINT:   Chief Complaint  Patient presents with  . Abdominal Pain  . Nausea  . Emesis    VITAL SIGNS:  Blood pressure 144/54, pulse 71, temperature 98.3 F (36.8 C), temperature source Oral, resp. rate 18, height 5\' 6"  (1.676 m), weight 83.2 kg (183 lb 6.8 oz), SpO2 88 %.  I/O:   Intake/Output Summary (Last 24 hours) at 05/24/15 0924 Last data filed at 05/24/15 0838  Gross per 24 hour  Intake      7 ml  Output   1000 ml  Net   -993 ml    PHYSICAL EXAMINATION:   Physical Exam  GENERAL: 54 y.o.-year-old patient lying in the bed with no acute distress. Chronically ill appearing. EYES: Pupils equal, round, reactive to light and accommodation. No scleral icterus. Extraocular muscles intact.  HEENT: Head atraumatic, normocephalic. Oropharynx and nasopharynx clear.  NECK: Supple, no jugular venous distention. No thyroid enlargement, no tenderness.  LUNGS: Normal breath sounds bilaterally, no wheezing, rales,rhonchi or crepitation. No use of accessory muscles of respiration. Decreased right basilar breath sounds CARDIOVASCULAR: S1, S2 normal. No murmurs, rubs, or gallops.  ABDOMEN: Soft, nontender, nondistended. Bowel sounds present. No organomegaly or mass.  EXTREMITIES: No pedal edema, cyanosis, or clubbing. Positive left upper extremity AV fistula with good bruit and good thrill.  Generalized Abdominal discomfort on palpation NEUROLOGIC: Cranial nerves II through XII are intact. Muscle strength 5/5 in all extremities. Sensation intact. Gait not checked.  PSYCHIATRIC: The  patient is alert and oriented x 3.  SKIN: No obvious rash, lesion, or ulcer.   DATA REVIEW:   CBC  Recent Labs Lab 05/22/15 0542  WBC 5.2  HGB 8.1*  HCT 24.5*  PLT 211    Chemistries   Recent Labs Lab 05/21/15 1613  05/23/15 0414  NA 138  < > 138  K 2.8*  < > 3.0*  CL 98*  < > 101  CO2 31  < > 32  GLUCOSE 85  < > 67  BUN <5*  < > 7  CREATININE 2.37*  < > 3.73*  CALCIUM 7.7*  < > 8.2*  AST 24  --   --   ALT 14  --   --   ALKPHOS 148*  --   --   BILITOT 0.8  --   --   < > = values in this interval not displayed.  Cardiac Enzymes  Recent Labs Lab 05/21/15 1613  TROPONINI <0.03    Microbiology Results  Results for orders placed or performed during the hospital encounter of 05/21/15  Blood culture (routine x 2)     Status: None (Preliminary result)   Collection Time: 05/21/15  8:50 PM  Result Value Ref Range Status   Specimen Description BLOOD RIGHT ARM  Final   Special Requests BOTTLES DRAWN AEROBIC AND ANAEROBIC [place]  Final   Culture NO GROWTH 2 DAYS  Final   Report Status PENDING  Incomplete  Blood culture (routine x 2)     Status: None (Preliminary result)   Collection Time: 05/21/15  8:52 PM  Result Value Ref Range Status   Specimen Description BLOOD RIGHT ARM  Final   Special Requests BOTTLES DRAWN AEROBIC AND ANAEROBIC 6CC  Final   Culture NO GROWTH 2 DAYS  Final   Report Status PENDING  Incomplete    RADIOLOGY:  No results found.  EKG:   Orders placed or performed during the hospital encounter of 05/21/15  . EKG 12-Lead  . EKG 12-Lead      Management plans discussed with the patient, family and they are in agreement.  CODE STATUS:     Code Status Orders        Start     Ordered   05/21/15 2143  Full code   Continuous     05/21/15 2142    Advance Directive Documentation        Most Recent Value   Type of Advance Directive  Living will   Pre-existing out of facility DNR order (yellow form or pink MOST form)     "MOST" Form in  Place?        TOTAL TIME TAKING CARE OF THIS PATIENT: 36 minutes.    Gladstone Lighter M.D on 05/24/2015 at 9:24 AM  Between 7am to 6pm - Pager - [phone]  After 6pm go to [email or web address] - password [email or web address] [facility]  Office  [phone]  CC: Primary care physician; Ellamae Sia, MD

## 2015-05-24 NOTE — Care Management Note (Signed)
Patient is active at [facility] on TTS schedule.  Admission records have been sent to clinic and I will follow up with additional records at discharge. [name] Liaison [phone]

## 2015-05-24 NOTE — Progress Notes (Signed)
[facility]  ROUNDING NOTE   Subjective:  Pt had HD yesterday. Still have periods of nausea. Requests promethazine in place of zofran.    Objective:  Vital signs in last 24 hours:  Temp:  [97.7 F (36.5 C)-98.3 F (36.8 C)] 98.3 F (36.8 C) (09/01 0749) Pulse Rate:  [64-75] 71 (09/01 0749) Resp:  [11-18] 18 (09/01 0749) BP: (130-159)/(46-65) 144/54 mmHg (09/01 0749) SpO2:  [88 %-100 %] 88 % (09/01 0749) Weight:  [83.2 kg (183 lb 6.8 oz)] 83.2 kg (183 lb 6.8 oz) (08/31 1324)  Weight change:  Filed Weights   05/22/15 0604 05/23/15 0918 05/23/15 1324  Weight: 82.056 kg (180 lb 14.4 oz) 84 kg (185 lb 3 oz) 83.2 kg (183 lb 6.8 oz)    Intake/Output: I/O last 3 completed shifts: In: 69 [IV Piggyback:42] Out: 1000 [Other:1000]   Intake/Output this shift:     Physical Exam: General: NAD, resting in bed  Head: Alopecia noted, atraumatic. Moist oral mucosal membranes  Eyes: Anicteric  Neck: Supple, trachea midline  Lungs:  Clear to auscultation normal effort  Heart: S1S2 no rubs  Abdomen:  Soft, nontender, BS present  Extremities:  1+ peripheral edema., 1+ right arm swelling.  Neurologic: Awake, alert, follows commands  Skin: No lesions  Access: LUE AVG    Basic Metabolic Panel:  Recent Labs Lab 05/20/15 1045 05/21/15 1613 05/22/15 0542 05/23/15 0414 05/23/15 0958  NA 139 138 141 138  --   K 2.8* 2.8* 3.8 3.0*  --   CL 97* 98* 103 101  --   CO2 30 31 31  32  --   GLUCOSE 72 85 73 67  --   BUN 6 <5* <5* 7  --   CREATININE 3.62* 2.37* 2.87* 3.73*  --   CALCIUM 8.5* 7.7* 7.7* 8.2*  --   PHOS  --   --   --   --  2.0*    Liver Function Tests:  Recent Labs Lab 05/20/15 1045 05/21/15 1613  AST 24 24  ALT 14 14  ALKPHOS 149* 148*  BILITOT 1.0 0.8  PROT 6.3* 6.1*  ALBUMIN 2.1* 2.1*    Recent Labs Lab 05/20/15 1045 05/21/15 1613  LIPASE <10* <10*   No results for input(s): AMMONIA in the last 168 hours.  CBC:  Recent Labs Lab  05/20/15 1233 05/21/15 1613 05/22/15 0542  WBC 4.5 4.5 5.2  NEUTROABS 2.3  --   --   HGB 8.9* 8.7* 8.1*  HCT 27.0* 26.3* 24.5*  MCV 86.2 86.0 86.8  PLT 239 226 211    Cardiac Enzymes:  Recent Labs Lab 05/20/15 1045 05/21/15 1613  TROPONINI <0.03 <0.03    BNP: Invalid input(s): POCBNP  CBG:  Recent Labs Lab 05/23/15 0747 05/23/15 0840 05/23/15 1711 05/23/15 2057 05/24/15 0755  GLUCAP 62* 72 78 73 93*    Microbiology: Results for orders placed or performed during the hospital encounter of 05/21/15  Blood culture (routine x 2)     Status: None (Preliminary result)   Collection Time: 05/21/15  8:50 PM  Result Value Ref Range Status   Specimen Description BLOOD RIGHT ARM  Final   Special Requests BOTTLES DRAWN AEROBIC AND ANAEROBIC [place]  Final   Culture NO GROWTH 2 DAYS  Final   Report Status PENDING  Incomplete  Blood culture (routine x 2)     Status: None (Preliminary result)   Collection Time: 05/21/15  8:52 PM  Result Value Ref Range Status   Specimen  Description BLOOD RIGHT ARM  Final   Special Requests BOTTLES DRAWN AEROBIC AND ANAEROBIC 6CC  Final   Culture NO GROWTH 2 DAYS  Final   Report Status PENDING  Incomplete    Coagulation Studies: No results for input(s): LABPROT, INR in the last 72 hours.  Urinalysis: No results for input(s): COLORURINE, LABSPEC, PHURINE, GLUCOSEU, HGBUR, BILIRUBINUR, KETONESUR, PROTEINUR, UROBILINOGEN, NITRITE, LEUKOCYTESUR in the last 72 hours.  Invalid input(s): APPERANCEUR    Imaging: No results found.   Medications:     . amLODipine  10 mg Oral Daily  . aspirin EC  81 mg Oral Daily  . atorvastatin  20 mg Oral QPM  . carvedilol  25 mg Oral BID WC  . cholecalciferol  400 Units Oral Daily  . citalopram  20 mg Oral Daily  . clopidogrel  75 mg Oral Daily  . epoetin (EPOGEN/PROCRIT) injection  10,000 Units Intravenous Q M,W,F-HD  . famotidine  20 mg Oral Daily  . heparin  5,000 Units Subcutaneous 3 times per day   . lipase/protease/amylase  36,000 Units Oral TID WC  . loratadine  10 mg Oral Daily  . metoCLOPramide  5 mg Oral TID AC & HS  . multivitamin  1 tablet Oral Daily  . pantoprazole  40 mg Oral QAC breakfast  . piperacillin-tazobactam (ZOSYN)  IV  3.375 g Intravenous Q12H  . ranolazine  1,000 mg Oral BID  . sevelamer carbonate  800 mg Oral TID WC  . sucralfate  1 g Oral TID WC & HS   sodium chloride, sodium chloride, acetaminophen **OR** acetaminophen, albuterol, alteplase, feeding supplement (NEPRO CARB STEADY), guaiFENesin-dextromethorphan, heparin, HYDROcodone-acetaminophen, insulin aspart, lidocaine (PF), lidocaine-prilocaine, nitroGLYCERIN, pentafluoroprop-tetrafluoroeth, promethazine, promethazine  Assessment/ Plan:  54 y.o. female with ESRD, DM (30 yrs with retinopathy, gastroparesis, neuropathy), OA, IBS, hyperlipidemia, h/o GI bleed, SHPTH, AOCD, diastolic heart failure ,hypertension, hyperlipidemia, anemia, CAD  Admitted 05/22/15 for nosocomial pneumonia, has history of tobacco abuse.  1.  ESRD on HD on MWF:  Pt had HD yesterday, no acute indication for HD today, will plan for HD again tomorrow.  2.  Bacterial pneumonia:  Nosocomial in nature.   -pt treated with both zosyn and vancomycin.  3.  SHPTH: phos low at 2.0, will stop renvela.   4.  Anemia of CKD: hgb 8.1 at last check, will continue epogen 10000 units IV with HD.    LOS: 3 Bond [place] 9/1/20169:29 AM

## 2015-05-26 LAB — CULTURE, BLOOD (ROUTINE X 2)
Culture: NO GROWTH
Culture: NO GROWTH

## 2015-06-05 ENCOUNTER — Emergency Department: Payer: Medicare Other

## 2015-06-05 ENCOUNTER — Inpatient Hospital Stay
Admission: EM | Admit: 2015-06-05 | Discharge: 2015-06-07 | DRG: 073 | Disposition: A | Discharge status: Home / Self Care | Payer: Medicare Other | Attending: Internal Medicine | Admitting: Internal Medicine

## 2015-06-05 DIAGNOSIS — K861 Other chronic pancreatitis: Secondary | ICD-10-CM | POA: Diagnosis present

## 2015-06-05 DIAGNOSIS — I5032 Chronic diastolic (congestive) heart failure: Secondary | ICD-10-CM | POA: Diagnosis present

## 2015-06-05 DIAGNOSIS — Z992 Dependence on renal dialysis: Secondary | ICD-10-CM | POA: Diagnosis not present

## 2015-06-05 DIAGNOSIS — F17210 Nicotine dependence, cigarettes, uncomplicated: Secondary | ICD-10-CM | POA: Diagnosis present

## 2015-06-05 DIAGNOSIS — E1143 Type 2 diabetes mellitus with diabetic autonomic (poly)neuropathy: Principal | ICD-10-CM | POA: Diagnosis present

## 2015-06-05 DIAGNOSIS — E1122 Type 2 diabetes mellitus with diabetic chronic kidney disease: Secondary | ICD-10-CM | POA: Diagnosis present

## 2015-06-05 DIAGNOSIS — E876 Hypokalemia: Secondary | ICD-10-CM | POA: Diagnosis present

## 2015-06-05 DIAGNOSIS — I252 Old myocardial infarction: Secondary | ICD-10-CM

## 2015-06-05 DIAGNOSIS — I120 Hypertensive chronic kidney disease with stage 5 chronic kidney disease or end stage renal disease: Secondary | ICD-10-CM | POA: Diagnosis present

## 2015-06-05 DIAGNOSIS — D631 Anemia in chronic kidney disease: Secondary | ICD-10-CM | POA: Diagnosis present

## 2015-06-05 DIAGNOSIS — Z79899 Other long term (current) drug therapy: Secondary | ICD-10-CM | POA: Diagnosis not present

## 2015-06-05 DIAGNOSIS — Z833 Family history of diabetes mellitus: Secondary | ICD-10-CM | POA: Diagnosis not present

## 2015-06-05 DIAGNOSIS — N19 Unspecified kidney failure: Secondary | ICD-10-CM | POA: Diagnosis not present

## 2015-06-05 DIAGNOSIS — R112 Nausea with vomiting, unspecified: Secondary | ICD-10-CM | POA: Diagnosis present

## 2015-06-05 DIAGNOSIS — E11649 Type 2 diabetes mellitus with hypoglycemia without coma: Secondary | ICD-10-CM | POA: Diagnosis present

## 2015-06-05 DIAGNOSIS — E46 Unspecified protein-calorie malnutrition: Secondary | ICD-10-CM | POA: Diagnosis present

## 2015-06-05 DIAGNOSIS — Z794 Long term (current) use of insulin: Secondary | ICD-10-CM

## 2015-06-05 DIAGNOSIS — K3184 Gastroparesis: Secondary | ICD-10-CM | POA: Diagnosis present

## 2015-06-05 DIAGNOSIS — K219 Gastro-esophageal reflux disease without esophagitis: Secondary | ICD-10-CM | POA: Diagnosis present

## 2015-06-05 DIAGNOSIS — E11319 Type 2 diabetes mellitus with unspecified diabetic retinopathy without macular edema: Secondary | ICD-10-CM | POA: Diagnosis present

## 2015-06-05 DIAGNOSIS — K920 Hematemesis: Secondary | ICD-10-CM | POA: Diagnosis present

## 2015-06-05 DIAGNOSIS — Z7902 Long term (current) use of antithrombotics/antiplatelets: Secondary | ICD-10-CM

## 2015-06-05 DIAGNOSIS — J449 Chronic obstructive pulmonary disease, unspecified: Secondary | ICD-10-CM | POA: Diagnosis present

## 2015-06-05 DIAGNOSIS — I2510 Atherosclerotic heart disease of native coronary artery without angina pectoris: Secondary | ICD-10-CM | POA: Diagnosis present

## 2015-06-05 DIAGNOSIS — N186 End stage renal disease: Secondary | ICD-10-CM | POA: Diagnosis present

## 2015-06-05 DIAGNOSIS — N2581 Secondary hyperparathyroidism of renal origin: Secondary | ICD-10-CM | POA: Diagnosis present

## 2015-06-05 DIAGNOSIS — J45909 Unspecified asthma, uncomplicated: Secondary | ICD-10-CM | POA: Diagnosis present

## 2015-06-05 DIAGNOSIS — E785 Hyperlipidemia, unspecified: Secondary | ICD-10-CM | POA: Diagnosis present

## 2015-06-05 LAB — COMPREHENSIVE METABOLIC PANEL
ALT: 19 U/L (ref 14–54)
AST: 25 U/L (ref 15–41)
Albumin: 1.9 g/dL — ABNORMAL LOW (ref 3.5–5.0)
Alkaline Phosphatase: 146 U/L — ABNORMAL HIGH (ref 38–126)
Anion gap: 10 (ref 5–15)
BUN: 14 mg/dL (ref 6–20)
CO2: 32 mmol/L (ref 22–32)
Calcium: 9.1 mg/dL (ref 8.9–10.3)
Chloride: 98 mmol/L — ABNORMAL LOW (ref 101–111)
Creatinine, Ser: 6.08 mg/dL — ABNORMAL HIGH (ref 0.44–1.00)
GFR calc Af Amer: 8 mL/min — ABNORMAL LOW (ref >60)
GFR calc non Af Amer: 7 mL/min — ABNORMAL LOW (ref >60)
Glucose, Bld: 76 mg/dL (ref 65–99)
Potassium: 2.9 mmol/L — CL (ref 3.5–5.1)
Sodium: 140 mmol/L (ref 135–145)
Total Bilirubin: 0.7 mg/dL (ref 0.3–1.2)
Total Protein: 6.3 g/dL — ABNORMAL LOW (ref 6.5–8.1)

## 2015-06-05 LAB — CBC WITH DIFFERENTIAL/PLATELET
Band Neutrophils: 0 % (ref 0–10)
Basophils Absolute: 0.1 10*3/uL (ref 0.0–0.1)
Basophils Relative: 3 % — ABNORMAL HIGH (ref 0–1)
Blasts: 0 %
Eosinophils Absolute: 0.2 10*3/uL (ref 0.0–0.7)
Eosinophils Relative: 6 % — ABNORMAL HIGH (ref 0–5)
HCT: 30.1 % — ABNORMAL LOW (ref 35.0–47.0)
Hemoglobin: 9.8 g/dL — ABNORMAL LOW (ref 12.0–16.0)
Lymphocytes Relative: 39 % (ref 12–46)
Lymphs Abs: 1.6 10*3/uL (ref 0.7–4.0)
MCH: 27.4 pg (ref 26.0–34.0)
MCHC: 32.4 g/dL (ref 32.0–36.0)
MCV: 84.6 fL (ref 80.0–100.0)
Metamyelocytes Relative: 0 %
Monocytes Absolute: 0.3 10*3/uL (ref 0.1–1.0)
Monocytes Relative: 8 % (ref 3–12)
Myelocytes: 0 %
Neutro Abs: 1.9 10*3/uL (ref 1.7–7.7)
Neutrophils Relative %: 44 % (ref 43–77)
Other: 0 %
Platelets: 196 10*3/uL (ref 150–440)
Promyelocytes Absolute: 0 %
RBC: 3.56 MIL/uL — ABNORMAL LOW (ref 3.80–5.20)
RDW: 19.4 % — ABNORMAL HIGH (ref 11.5–14.5)
WBC: 4.1 10*3/uL (ref 3.6–11.0)
nRBC: 0 /100 WBC

## 2015-06-05 LAB — LIPASE, BLOOD: Lipase: 12 U/L — ABNORMAL LOW (ref 22–51)

## 2015-06-05 LAB — GLUCOSE, CAPILLARY: Glucose-Capillary: 79 mg/dL (ref 65–99)

## 2015-06-05 MED ORDER — ACETAMINOPHEN 325 MG PO TABS: 650.0000 mg | ORAL_TABLET | Freq: Four times a day (QID) | ORAL | Status: DC | PRN

## 2015-06-05 MED ORDER — ATORVASTATIN CALCIUM 20 MG PO TABS
20.0000 mg | ORAL_TABLET | Freq: Every evening | ORAL | Status: DC
  Administered 2015-06-05 – 2015-06-06 (×2): 20 mg via ORAL
  Filled 2015-06-05 (×2): qty 1

## 2015-06-05 MED ORDER — METOCLOPRAMIDE HCL 10 MG PO TABS: 10.0000 mg | ORAL_TABLET | Freq: Three times a day (TID) | ORAL | Status: DC

## 2015-06-05 MED ORDER — CLOPIDOGREL BISULFATE 75 MG PO TABS
75.0000 mg | ORAL_TABLET | Freq: Every day | ORAL | Status: DC
  Administered 2015-06-05 – 2015-06-07 (×3): 75 mg via ORAL
  Filled 2015-06-05 (×3): qty 1

## 2015-06-05 MED ORDER — HYDROCODONE-ACETAMINOPHEN 5-325 MG PO TABS: 1.0000 | ORAL_TABLET | Freq: Three times a day (TID) | ORAL | Status: DC | PRN

## 2015-06-05 MED ORDER — VITAMIN D (CHOLECALCIFEROL) 10 MCG (400 UNIT) PO TABS
400.0000 [IU] | ORAL_TABLET | Freq: Every day | ORAL | Status: DC
  Administered 2015-06-05 – 2015-06-07 (×3): 400 [IU] via ORAL
  Filled 2015-06-05 (×4): qty 1

## 2015-06-05 MED ORDER — SODIUM CHLORIDE 0.9 % IJ SOLN
3.0000 mL | Freq: Two times a day (BID) | INTRAMUSCULAR | Status: DC
  Administered 2015-06-05 – 2015-06-06 (×2): 3 mL via INTRAVENOUS

## 2015-06-05 MED ORDER — ALBUTEROL SULFATE (2.5 MG/3ML) 0.083% IN NEBU: 2.5000 mg | INHALATION_SOLUTION | RESPIRATORY_TRACT | Status: DC | PRN

## 2015-06-05 MED ORDER — INSULIN ASPART 100 UNIT/ML ~~LOC~~ SOLN: 0.0000 [IU] | Freq: Three times a day (TID) | SUBCUTANEOUS | Status: DC

## 2015-06-05 MED ORDER — HYDROCODONE-ACETAMINOPHEN 5-325 MG PO TABS
1.0000 | ORAL_TABLET | ORAL | Status: DC | PRN
  Administered 2015-06-05 – 2015-06-06 (×2): 1 via ORAL
  Administered 2015-06-06: 2 via ORAL
  Filled 2015-06-05 (×3): qty 1
  Filled 2015-06-05: qty 2

## 2015-06-05 MED ORDER — POTASSIUM CHLORIDE CRYS ER 20 MEQ PO TBCR
40.0000 meq | EXTENDED_RELEASE_TABLET | Freq: Once | ORAL | Status: AC
  Administered 2015-06-05: 40 meq via ORAL
  Filled 2015-06-05: qty 2

## 2015-06-05 MED ORDER — SENNA 8.6 MG PO TABS
1.0000 | ORAL_TABLET | Freq: Two times a day (BID) | ORAL | Status: DC
  Administered 2015-06-05 – 2015-06-07 (×3): 8.6 mg via ORAL
  Filled 2015-06-05 (×3): qty 1

## 2015-06-05 MED ORDER — ALUM & MAG HYDROXIDE-SIMETH 200-200-20 MG/5ML PO SUSP: 30.0000 mL | Freq: Four times a day (QID) | ORAL | Status: DC | PRN

## 2015-06-05 MED ORDER — SODIUM CHLORIDE 0.9 % IV BOLUS (SEPSIS): 1000.0000 mL | Freq: Once | INTRAVENOUS | Status: DC

## 2015-06-05 MED ORDER — DOCUSATE SODIUM 100 MG PO CAPS
100.0000 mg | ORAL_CAPSULE | Freq: Two times a day (BID) | ORAL | Status: DC
  Administered 2015-06-05 – 2015-06-07 (×3): 100 mg via ORAL
  Filled 2015-06-05 (×3): qty 1

## 2015-06-05 MED ORDER — LORATADINE 10 MG PO TABS
10.0000 mg | ORAL_TABLET | Freq: Every day | ORAL | Status: DC
  Administered 2015-06-05 – 2015-06-07 (×2): 10 mg via ORAL
  Filled 2015-06-05 (×2): qty 1

## 2015-06-05 MED ORDER — RANOLAZINE ER 500 MG PO TB12
1000.0000 mg | ORAL_TABLET | Freq: Two times a day (BID) | ORAL | Status: DC
  Administered 2015-06-05 – 2015-06-07 (×3): 1000 mg via ORAL
  Filled 2015-06-05 (×3): qty 2

## 2015-06-05 MED ORDER — SUCRALFATE 1 G PO TABS
1.0000 g | ORAL_TABLET | Freq: Three times a day (TID) | ORAL | Status: DC
  Administered 2015-06-05 – 2015-06-07 (×6): 1 g via ORAL
  Filled 2015-06-05 (×6): qty 1

## 2015-06-05 MED ORDER — ACETAMINOPHEN 650 MG RE SUPP: 650.0000 mg | Freq: Four times a day (QID) | RECTAL | Status: DC | PRN

## 2015-06-05 MED ORDER — NITROGLYCERIN 0.4 MG SL SUBL: 0.4000 mg | SUBLINGUAL_TABLET | SUBLINGUAL | Status: DC | PRN

## 2015-06-05 MED ORDER — RENA-VITE PO TABS
1.0000 | ORAL_TABLET | Freq: Every day | ORAL | Status: DC
  Administered 2015-06-05 – 2015-06-07 (×3): 1 via ORAL
  Filled 2015-06-05 (×3): qty 1

## 2015-06-05 MED ORDER — SODIUM CHLORIDE 0.9 % IV BOLUS (SEPSIS)
500.0000 mL | Freq: Once | INTRAVENOUS | Status: AC
  Administered 2015-06-05: 500 mL via INTRAVENOUS

## 2015-06-05 MED ORDER — METOCLOPRAMIDE HCL 5 MG PO TABS
5.0000 mg | ORAL_TABLET | Freq: Three times a day (TID) | ORAL | Status: DC
  Administered 2015-06-05 – 2015-06-06 (×2): 5 mg via ORAL
  Filled 2015-06-05 (×2): qty 1

## 2015-06-05 MED ORDER — PANCRELIPASE (LIP-PROT-AMYL) 12000-38000 UNITS PO CPEP
36000.0000 [IU] | ORAL_CAPSULE | Freq: Three times a day (TID) | ORAL | Status: DC
  Administered 2015-06-06 – 2015-06-07 (×4): 36000 [IU] via ORAL
  Filled 2015-06-05 (×6): qty 3

## 2015-06-05 MED ORDER — CARVEDILOL 12.5 MG PO TABS
25.0000 mg | ORAL_TABLET | Freq: Two times a day (BID) | ORAL | Status: DC
  Administered 2015-06-05 – 2015-06-07 (×3): 25 mg via ORAL
  Filled 2015-06-05 (×3): qty 2

## 2015-06-05 MED ORDER — PANTOPRAZOLE SODIUM 40 MG PO TBEC
40.0000 mg | DELAYED_RELEASE_TABLET | Freq: Every day | ORAL | Status: DC
  Administered 2015-06-05 – 2015-06-07 (×3): 40 mg via ORAL
  Filled 2015-06-05 (×3): qty 1

## 2015-06-05 MED ORDER — PROMETHAZINE HCL 25 MG/ML IJ SOLN
25.0000 mg | Freq: Four times a day (QID) | INTRAMUSCULAR | Status: DC | PRN
  Filled 2015-06-05: qty 1

## 2015-06-05 MED ORDER — PROMETHAZINE HCL 25 MG PO TABS
12.5000 mg | ORAL_TABLET | Freq: Four times a day (QID) | ORAL | Status: DC | PRN
  Administered 2015-06-05 – 2015-06-06 (×2): 12.5 mg via ORAL
  Filled 2015-06-05 (×3): qty 1

## 2015-06-05 MED ORDER — CITALOPRAM HYDROBROMIDE 20 MG PO TABS
20.0000 mg | ORAL_TABLET | Freq: Every day | ORAL | Status: DC
  Administered 2015-06-05 – 2015-06-07 (×3): 20 mg via ORAL
  Filled 2015-06-05 (×3): qty 1

## 2015-06-05 MED ORDER — HEPARIN SODIUM (PORCINE) 5000 UNIT/ML IJ SOLN
5000.0000 [IU] | Freq: Three times a day (TID) | INTRAMUSCULAR | Status: DC
  Administered 2015-06-05 – 2015-06-07 (×4): 5000 [IU] via SUBCUTANEOUS
  Filled 2015-06-05 (×4): qty 1

## 2015-06-05 MED ORDER — HYDROMORPHONE HCL 1 MG/ML IJ SOLN
1.0000 mg | Freq: Once | INTRAMUSCULAR | Status: DC
  Filled 2015-06-05: qty 1

## 2015-06-05 MED ORDER — AMLODIPINE BESYLATE 10 MG PO TABS
10.0000 mg | ORAL_TABLET | Freq: Every day | ORAL | Status: DC
  Administered 2015-06-05 – 2015-06-07 (×3): 10 mg via ORAL
  Filled 2015-06-05 (×3): qty 1

## 2015-06-05 MED ORDER — PROMETHAZINE HCL 25 MG/ML IJ SOLN
12.5000 mg | Freq: Once | INTRAMUSCULAR | Status: AC
  Administered 2015-06-05: 12.5 mg via INTRAVENOUS
  Filled 2015-06-05: qty 1

## 2015-06-05 MED ORDER — MORPHINE SULFATE (PF) 4 MG/ML IV SOLN
INTRAVENOUS | Status: AC
  Administered 2015-06-05: 4 mg via INTRAVENOUS
  Filled 2015-06-05: qty 1

## 2015-06-05 MED ORDER — ASPIRIN EC 81 MG PO TBEC
81.0000 mg | DELAYED_RELEASE_TABLET | Freq: Every day | ORAL | Status: DC
  Administered 2015-06-05 – 2015-06-07 (×3): 81 mg via ORAL
  Filled 2015-06-05 (×3): qty 1

## 2015-06-05 MED ORDER — INSULIN ASPART 100 UNIT/ML ~~LOC~~ SOLN: 0.0000 [IU] | Freq: Every day | SUBCUTANEOUS | Status: DC

## 2015-06-05 MED ORDER — GUAIFENESIN-DM 100-10 MG/5ML PO SYRP: 10.0000 mL | ORAL_SOLUTION | Freq: Four times a day (QID) | ORAL | Status: DC | PRN

## 2015-06-05 MED ORDER — SEVELAMER CARBONATE 800 MG PO TABS
800.0000 mg | ORAL_TABLET | Freq: Three times a day (TID) | ORAL | Status: DC
  Filled 2015-06-05 (×3): qty 1

## 2015-06-05 MED ORDER — MORPHINE SULFATE (PF) 4 MG/ML IV SOLN
4.0000 mg | Freq: Once | INTRAVENOUS | Status: AC
  Administered 2015-06-05: 4 mg via INTRAVENOUS

## 2015-06-05 NOTE — ED Notes (Signed)
Pt c/o abdominal pain d/t gastroparesis. Pt reports nausea and vomiting "a lot" inn the past 24 hrs.Denies diarrhea.

## 2015-06-05 NOTE — H&P (Signed)
[facility] [place] NAME: [name]    MR#:  [id number]  DATE OF BIRTH:  1960-12-02  DATE OF ADMISSION:  06/05/2015  PRIMARY CARE PHYSICIAN: Ellamae Sia, MD   REQUESTING/REFERRING PHYSICIAN: Dr. Marcelene Butte  CHIEF COMPLAINT:  Nausea vomiting HISTORY OF PRESENT ILLNESS:  [name]  is a 54 y.o. female with a known history of end-stage renal disease on hemodialysis, coronary artery disease, diastolic heart failure and COPD who was recently discharged from the hospital with pneumonia and presents today with 3-4 days of nausea vomiting. Patient says she is unable taken any by mouth's and continues to have nausea and vomiting. In the emergency room she has received when necessary Phenergan as well as morphine and gentle hydration.  PAST MEDICAL HISTORY:   Past Medical History  Diagnosis Date  . Asthma   . Collagen vascular disease   . Diabetes mellitus without complication   . Hypertension   . Coronary artery disease     a. cath 2013: stenting to RCA (report not available); b. cath 2014: LM nl, pLAD 40%, mLAD nl, ost LCx 40%, mid LCx nl, pRCA 30% @ site of prior stent, mRCA 50%  . Diabetic neuropathy   . ESRD (end stage renal disease) on dialysis     M-W-F  . GERD (gastroesophageal reflux disease)   . Hx of pancreatitis 2015  . Myocardial infarction   . Pneumonia   . Mitral regurgitation     a. echo 10/2013: EF 62%, noWMA, mildly dilated LA, mild to mod MR/TR, GR1DD  . COPD (chronic obstructive pulmonary disease)   . chronic diastolic CHF 123XX123  . dialysis 2006    PAST SURGICAL HISTORY:   Past Surgical History  Procedure Laterality Date  . Cholecystectomy    . Appendectomy    . Abdominal hysterectomy    . Dialysis fistula creation Left     upper arm  . Esophagogastroduodenoscopy N/A 03/08/2015    Procedure: ESOPHAGOGASTRODUODENOSCOPY (EGD);  Surgeon: Manya Silvas, MD;  Location: [facility] ENDOSCOPY;  Service:  Endoscopy;  Laterality: N/A;    SOCIAL HISTORY:   Social History  Substance Use Topics  . Smoking status: Current Some Day Smoker -- 0.50 packs/day    Types: Cigarettes    Last Attempt to Quit: 02/13/2015  . Smokeless tobacco: Never Used  . Alcohol Use: No    FAMILY HISTORY:   Family History  Problem Relation Age of Onset  . Kidney disease Mother   . Diabetes Mother     DRUG ALLERGIES:   Allergies  Allergen Reactions  . Ace Inhibitors Swelling  . Ativan [Lorazepam] Other (See Comments)    Reaction:  Hallucinations and headaches  . Codeine Nausea And Vomiting  . Compazine [Prochlorperazine Edisylate] Nausea And Vomiting  . Gabapentin Other (See Comments)    Reaction:  Unknown   . Losartan Other (See Comments)    Reaction:  Unknown   . Scopolamine Other (See Comments)    Reaction:  Unknown   . Zofran [Ondansetron Hcl] Other (See Comments)    Reaction:  hallucinations   . Oxycodone Anxiety  . Tape Rash     REVIEW OF SYSTEMS:  CONSTITUTIONAL: No fever, positive fatigue and weakness.  EYES: No blurred or double vision.  EARS, NOSE, AND THROAT: No tinnitus or ear pain.  RESPIRATORY: No cough, shortness of breath, wheezing or hemoptysis.  CARDIOVASCULAR: No chest pain, orthopnea, edema.  GASTROINTESTINAL: Positive nausea, and vomiting, no diarrhea  no abdominal pain. Positive constipation GENITOURINARY: No dysuria, hematuria.  ENDOCRINE: No polyuria, nocturia,  HEMATOLOGY: No anemia, easy bruising or bleeding SKIN: No rash or lesion. MUSCULOSKELETAL: No joint pain or arthritis.   NEUROLOGIC: No tingling, numbness, weakness.  PSYCHIATRY: No anxiety or depression.   MEDICATIONS AT HOME:   Prior to Admission medications   Medication Sig Start Date End Date Taking? Authorizing Provider  albuterol (PROVENTIL HFA;VENTOLIN HFA) 108 (90 BASE) MCG/ACT inhaler Inhale 4-6 puffs by mouth every 4 hours as needed for wheezing, cough, and/or shortness of breath Patient not  taking: Reported on 05/21/2015 02/17/15   Hinda Kehr, MD  albuterol (PROVENTIL) (2.5 MG/3ML) 0.083% nebulizer solution Take 2.5 mg by nebulization every 4 (four) hours as needed for wheezing or shortness of breath.    Historical Provider, MD  amLODipine (NORVASC) 10 MG tablet Take 10 mg by mouth daily.    Historical Provider, MD  aspirin EC 81 MG tablet Take 81 mg by mouth daily.    Historical Provider, MD  atorvastatin (LIPITOR) 20 MG tablet Take 20 mg by mouth every evening.    Historical Provider, MD  B Complex-C-Biotin-E-FA (RENATABS PO) Take 1 tablet by mouth daily.    Historical Provider, MD  carvedilol (COREG) 25 MG tablet Take 25 mg by mouth 2 (two) times daily.    Historical Provider, MD  cetirizine (ZYRTEC) 10 MG tablet Take 10 mg by mouth daily.    Historical Provider, MD  citalopram (CELEXA) 20 MG tablet Take 20 mg by mouth daily.    Historical Provider, MD  clopidogrel (PLAVIX) 75 MG tablet Take 75 mg by mouth daily.    Historical Provider, MD  guaiFENesin-dextromethorphan (ROBITUSSIN DM) 100-10 MG/5ML syrup Take 10 mLs by mouth every 6 (six) hours as needed for cough. 05/24/15   Gladstone Lighter, MD  HYDROcodone-acetaminophen (NORCO/VICODIN) 5-325 MG per tablet Take 1 tablet by mouth 3 (three) times daily as needed for moderate pain or severe pain.    Historical Provider, MD  insulin aspart (NOVOLOG) 100 UNIT/ML injection Inject 2-10 Units into the skin 3 (three) times daily as needed for high blood sugar. Pt uses as needed per sliding scale:    151-200:  2 units  201-250:  4 units 251-300:  6 units 301-350:  8 units  351-401:  10 units and notify MD.    Historical Provider, MD  lipase/protease/amylase (CREON) 12000 UNITS CPEP capsule Take 36,000 Units by mouth 3 (three) times daily with meals.     Historical Provider, MD  metoCLOPramide (REGLAN) 5 MG tablet Take 1 tablet (5 mg total) by mouth 4 (four) times daily -  before meals and at bedtime. 02/24/15   [name], MD   nitroGLYCERIN (NITROSTAT) 0.4 MG SL tablet Place 0.4 mg under the tongue every 5 (five) minutes x 3 doses as needed for chest pain.     Historical Provider, MD  pantoprazole (PROTONIX) 40 MG tablet Take 40 mg by mouth daily.    Historical Provider, MD  promethazine (PHENERGAN) 12.5 MG tablet Take 12.5 mg by mouth every 6 (six) hours as needed for nausea or vomiting.    Historical Provider, MD  ranitidine (ZANTAC) 150 MG tablet Take 150 mg by mouth daily.     Historical Provider, MD  ranolazine (RANEXA) 1000 MG SR tablet Take 1,000 mg by mouth 2 (two) times daily.    Historical Provider, MD  sevelamer carbonate (RENVELA) 800 MG tablet Take 800 mg by mouth 3 (three) times daily with meals.  Historical Provider, MD  sucralfate (CARAFATE) 1 G tablet Take 1 tablet (1 g total) by mouth 4 (four) times daily -  with meals and at bedtime. 02/24/15   [name], MD  Vitamin D, Cholecalciferol, 400 UNITS TABS Take 400 Units by mouth daily.     Historical Provider, MD      VITAL SIGNS:  Blood pressure 144/68, pulse 64, temperature 97.9 F (36.6 C), temperature source Oral, resp. rate 18, height 5\' 5"  (1.651 m), weight 83.462 kg (184 lb), SpO2 94 %.  PHYSICAL EXAMINATION:  GENERAL:  54 y.o.-year-old patient lying in the bed with no acute distress.  EYES: Pupils equal, round, reactive to light and accommodation. No scleral icterus. Extraocular muscles intact.  HEENT: Head atraumatic, normocephalic. Oropharynx clear.  NECK:  Supple, no jugular venous distention. No thyroid enlargement, no tenderness.  LUNGS: Normal breath sounds bilaterally, no wheezing, rales,rhonchi or crepitation. No use of accessory muscles of respiration.  CARDIOVASCULAR: S1, S2 normal. No murmurs, rubs, or gallops.  ABDOMEN: Soft, nontender, nondistended. Bowel sounds present. No organomegaly or mass.  EXTREMITIES: No pedal edema, cyanosis, or clubbing.  NEUROLOGIC: Cranial nerves II through XII are grossly intact. No focal  deficits. PSYCHIATRIC: The patient is alert and oriented x 3.  SKIN: No obvious rash, lesion, or ulcer. AV fistula without sign of infection  LABORATORY PANEL:   CBC  Recent Labs Lab 06/05/15 1600  WBC 4.1  HGB 9.8*  HCT 30.1*  PLT 196   ------------------------------------------------------------------------------------------------------------------  Chemistries   Recent Labs Lab 06/05/15 1600  NA 140  K 2.9*  CL 98*  CO2 32  GLUCOSE 76  BUN 14  CREATININE 6.08*  CALCIUM 9.1  AST 25  ALT 19  ALKPHOS 146*  BILITOT 0.7   ------------------------------------------------------------------------------------------------------------------  Cardiac Enzymes No results for input(s): TROPONINI in the last 168 hours. ------------------------------------------------------------------------------------------------------------------  RADIOLOGY:  Dg Abd Acute W/chest  06/05/2015   CLINICAL DATA:  Abdominal pain, gastroparesis, nausea, vomiting, asthma, collagen vascular disease, diabetes mellitus, hypertension, end-stage renal disease on dialysis, COPD, chronic diastolic CHF, smoker  EXAM: DG ABDOMEN ACUTE W/ 1V CHEST  COMPARISON:  05/21/2015  FINDINGS: Enlargement of cardiac silhouette with pulmonary vascular congestion.  Mediastinal contours normal.  Mild pulmonary edema with increase in LEFT basilar pleural effusion and atelectasis.  Improved aeration at RIGHT base versus previous study.  Vascular stents identified in the LEFT axilla extending through the LEFT subclavian region.  Minimally prominent stool in colon.  Surgical clips RIGHT upper quadrant likely cholecystectomy.  Few scattered vascular calcifications.  Bowel gas pattern otherwise normal.  No evidence of bowel obstruction, bowel wall thickening, or free intraperitoneal air.  Diffuse osseous demineralization.  IMPRESSION: CHF with LEFT pleural effusion and basilar atelectasis.  Slightly prominent stool in colon.  Otherwise  negative abdominal radiographs.   Electronically Signed   By: Lavonia Dana M.D.   On: 06/05/2015 16:11    EKG:    IMPRESSION AND PLAN:   54 year old female with end-stage renal disease and hematemesis, essential hypertension, diastolic heart failure, diabetic neuropathy and gastroparesis who presents with intractable nausea and vomiting.  1. Intractable nausea and vomiting likely secondary diabetic gastroparesis: Patient will be placed on the medical/surgical service. I will continue with supportive care including antiemetics.  2. Hypokalemia: This is secondary to problem #1. Due to her end-stage renal disease I will replace potassium very gently and repeat BMP in a.m.  3. End-stage renal disease on hemodialysis: Patient will have nephrology consultation and will require dialysis  per her normal schedule.  4. Hyperlipidemia: Continue atorvastatin  5. Essential hypertension: Continue coronary Norvasc  6. Diabetes: Patient will be in sinus scale insulin and ADA diet. 7. chronic pancreatitis: Continue pancreatic enzymes   All the records are reviewed and case discussed with ED provider. Management plans discussed with the patient and she is in agreement.  CODE STATUS: FULL  TOTAL TIME TAKING CARE OF THIS PATIENT: 45 minutes.    Deeya Richeson M.D on 06/05/2015 at 6:27 PM  Between 7am to 6pm - Pager - [phone] After 6pm go to [email or web address] - password [email or web address] [facility]  Office  [phone]  CC: Primary care physician; Ellamae Sia, MD

## 2015-06-05 NOTE — ED Provider Notes (Signed)
Time Seen: Approximately 1440 I have reviewed the triage notes  Chief Complaint: Abdominal Pain   History of Present Illness: [name] is a 54 y.o. female who has a long history of gastroparesis secondary to diabetes. Patient also has a history of renal failure and states that she missed her dialysis. She presents for persistent nausea and vomiting and epigastric abdominal pain consistent with her previous histories of gastroparesis. He denies any fever or back pain at home. She denies any chest pain or shortness of breath. She denies any lower abdominal pain and points mainly to the epigastric periumbilical area as the source of discomfort. She denies any hematemesis or biliary emesis. She denies any loose stool or diarrhea, melena or hematochezia.   Past Medical History  Diagnosis Date  . Asthma   . Collagen vascular disease   . Diabetes mellitus without complication   . Hypertension   . Coronary artery disease     a. cath 2013: stenting to RCA (report not available); b. cath 2014: LM nl, pLAD 40%, mLAD nl, ost LCx 40%, mid LCx nl, pRCA 30% @ site of prior stent, mRCA 50%  . Diabetic neuropathy   . ESRD (end stage renal disease) on dialysis     M-W-F  . GERD (gastroesophageal reflux disease)   . Hx of pancreatitis 2015  . Myocardial infarction   . Pneumonia   . Mitral regurgitation     a. echo 10/2013: EF 62%, noWMA, mildly dilated LA, mild to mod MR/TR, GR1DD  . COPD (chronic obstructive pulmonary disease)   . chronic diastolic CHF 123XX123  . dialysis 2006    Patient Active Problem List   Diagnosis Date Noted  . Pneumonia 05/21/2015  . Hypoglycemia 04/24/2015  . Unresponsiveness 04/24/2015  . Bradycardia 04/24/2015  . Hypothermia 04/24/2015  . Acute respiratory failure 04/24/2015  . Acute diastolic CHF (congestive heart failure) 04/05/2015  . Diabetic gastroparesis 04/05/2015  . Hypokalemia 04/05/2015  . Generalized weakness 04/05/2015  . Acute pulmonary edema  04/03/2015  . Nausea and vomiting 04/03/2015  . Hypoglycemia associated with diabetes 04/03/2015  . Anemia of chronic disease 04/03/2015  . Secondary hyperparathyroidism 04/03/2015  . Pressure ulcer 04/02/2015  . Acute respiratory failure with hypoxia 04/01/2015  . Adjustment disorder with anxiety 03/14/2015  . Somatic symptom disorder, mild 03/08/2015  . Coronary artery disease involving native coronary artery of native heart without angina pectoris   . Nausea & vomiting 03/06/2015  . Abdominal pain 03/06/2015  . DM (diabetes mellitus) 03/06/2015  . HTN (hypertension) 03/06/2015  . Gastroparesis 02/24/2015  . Pleural effusion 02/19/2015  . HCAP (healthcare-associated pneumonia) 02/19/2015  . End-stage renal disease on hemodialysis 02/19/2015    Past Surgical History  Procedure Laterality Date  . Cholecystectomy    . Appendectomy    . Abdominal hysterectomy    . Dialysis fistula creation Left     upper arm  . Esophagogastroduodenoscopy N/A 03/08/2015    Procedure: ESOPHAGOGASTRODUODENOSCOPY (EGD);  Surgeon: Manya Silvas, MD;  Location: [facility] ENDOSCOPY;  Service: Endoscopy;  Laterality: N/A;    Past Surgical History  Procedure Laterality Date  . Cholecystectomy    . Appendectomy    . Abdominal hysterectomy    . Dialysis fistula creation Left     upper arm  . Esophagogastroduodenoscopy N/A 03/08/2015    Procedure: ESOPHAGOGASTRODUODENOSCOPY (EGD);  Surgeon: Manya Silvas, MD;  Location[facility] ENDOSCOPY;  Service: Endoscopy;  Laterality: N/A;    Current Outpatient Rx  Name  Route  Sig  Dispense  Refill  . albuterol (PROVENTIL HFA;VENTOLIN HFA) 108 (90 BASE) MCG/ACT inhaler      Inhale 4-6 puffs by mouth every 4 hours as needed for wheezing, cough, and/or shortness of breath Patient taking differently: Inhale 4-6 puffs into the lungs every 4 (four) hours as needed for wheezing or shortness of breath (or cough).    1 Inhaler   1   . albuterol (PROVENTIL) (2.5 MG/3ML)  0.083% nebulizer solution   Nebulization   Take 2.5 mg by nebulization every 4 (four) hours as needed for wheezing or shortness of breath.         Marland Kitchen amLODipine (NORVASC) 10 MG tablet   Oral   Take 10 mg by mouth daily.         Marland Kitchen aspirin EC 81 MG tablet   Oral   Take 81 mg by mouth daily.         Marland Kitchen atorvastatin (LIPITOR) 20 MG tablet   Oral   Take 20 mg by mouth every evening.         . B Complex-C-Biotin-E-FA (RENATABS PO)   Oral   Take 1 tablet by mouth daily.         . carvedilol (COREG) 25 MG tablet   Oral   Take 25 mg by mouth 2 (two) times daily.         . cetirizine (ZYRTEC) 10 MG tablet   Oral   Take 10 mg by mouth daily.         . citalopram (CELEXA) 20 MG tablet   Oral   Take 20 mg by mouth daily.         . clopidogrel (PLAVIX) 75 MG tablet   Oral   Take 75 mg by mouth daily.         Marland Kitchen guaiFENesin-dextromethorphan (ROBITUSSIN DM) 100-10 MG/5ML syrup   Oral   Take 10 mLs by mouth every 6 (six) hours as needed for cough.   118 mL   0   . HYDROcodone-acetaminophen (NORCO/VICODIN) 5-325 MG per tablet   Oral   Take 1 tablet by mouth 3 (three) times daily as needed for moderate pain.          Marland Kitchen insulin aspart (NOVOLOG) 100 UNIT/ML injection   Subcutaneous   Inject 2-10 Units into the skin 3 (three) times daily as needed for high blood sugar. Pt uses as needed per sliding scale:    151-200:  2 units  201-250:  4 units 251-300:  6 units 301-350:  8 units  351-401:  10 units and notify MD.         . levofloxacin (LEVAQUIN) 750 MG tablet   Oral   Take 750 mg by mouth every Monday, Wednesday, and Friday.         . lipase/protease/amylase (CREON) 12000 UNITS CPEP capsule   Oral   Take 36,000 Units by mouth 3 (three) times daily with meals.          . metoCLOPramide (REGLAN) 5 MG tablet   Oral   Take 1 tablet (5 mg total) by mouth 4 (four) times daily -  before meals and at bedtime.   40 tablet   0   . nitroGLYCERIN  (NITROSTAT) 0.4 MG SL tablet   Sublingual   Place 0.4 mg under the tongue every 5 (five) minutes x 3 doses as needed for chest pain.          . pantoprazole (PROTONIX) 40 MG tablet  Oral   Take 40 mg by mouth daily.         . promethazine (PHENERGAN) 12.5 MG tablet   Oral   Take 12.5 mg by mouth every 6 (six) hours as needed for nausea or vomiting.         . ranitidine (ZANTAC) 150 MG tablet   Oral   Take 150 mg by mouth daily.          . ranolazine (RANEXA) 1000 MG SR tablet   Oral   Take 1,000 mg by mouth 2 (two) times daily.         . sevelamer carbonate (RENVELA) 800 MG tablet   Oral   Take 800 mg by mouth 3 (three) times daily with meals.          . sucralfate (CARAFATE) 1 G tablet   Oral   Take 1 tablet (1 g total) by mouth 4 (four) times daily -  with meals and at bedtime.   120 tablet   0   . Vitamin D, Cholecalciferol, 400 UNITS TABS   Oral   Take 400 Units by mouth daily.            Allergies:  Ace inhibitors; Ativan; Codeine; Compazine; Gabapentin; Losartan; Scopolamine; Zofran; Oxycodone; and Tape  Family History: Family History  Problem Relation Age of Onset  . Kidney disease Mother   . Diabetes Mother     Social History: Social History  Substance Use Topics  . Smoking status: Current Some Day Smoker -- 0.50 packs/day    Types: Cigarettes    Last Attempt to Quit: 02/13/2015  . Smokeless tobacco: Never Used  . Alcohol Use: No     Review of Systems:   10 point review of systems was performed and was otherwise negative:  Constitutional: No fever Eyes: No visual disturbances ENT: No sore throat, ear pain Cardiac: No chest pain Respiratory: No shortness of breath, wheezing, or stridor Abdomen: No abdominal pain, no vomiting, No diarrhea Endocrine: No weight loss, No night sweats Extremities: No peripheral edema, cyanosis Skin: No rashes, easy bruising Neurologic: No focal weakness, trouble with speech or  swollowing Urologic: No dysuria, Hematuria, or urinary frequency   Physical Exam:  ED Triage Vitals  Enc Vitals Group     BP 06/05/15 1440 142/70 mmHg     Pulse Rate 06/05/15 1440 70     Resp 06/05/15 1719 18     Temp 06/05/15 1440 97.9 F (36.6 C)     Temp Source 06/05/15 1440 Oral     SpO2 06/05/15 1437 100 %     Weight 06/05/15 1440 184 lb (83.462 kg)     Height 06/05/15 1440 5\' 5"  (1.651 m)     Head Cir --      Peak Flow --      Pain Score 06/05/15 1445 7     Pain Loc --      Pain Edu? --      Excl. in [place]? --     General: Awake , Alert , and Oriented times 3; GCS 15 Head: Normal cephalic , atraumatic Eyes: Pupils equal , round, reactive to light Nose/Throat: No nasal drainage, patent upper airway without erythema or exudate.  Neck: Supple, Full range of motion, No anterior adenopathy or palpable thyroid masses Lungs: Clear to ascultation without wheezes , rhonchi, mild rales bilaterally to bases Heart: Regular rate, regular rhythm without murmurs , gallops , or rubs Abdomen: Soft, non tender without rebound, guarding ,  or rigidity; bowel sounds positive and symmetric in all 4 quadrants. No organomegaly .        Extremities: Fistula.  2 plus symmetric pulses. No edema, clubbing or cyanosis Neurologic: normal ambulation, Motor symmetric without deficits, sensory intact Skin: warm, dry, no rashes   Labs:   All laboratory work was reviewed including any pertinent negatives or positives listed below:  Labs Reviewed  COMPREHENSIVE METABOLIC PANEL - Abnormal; Notable for the following:    Potassium 2.9 (*)    Chloride 98 (*)    Creatinine, Ser 6.08 (*)    Total Protein 6.3 (*)    Albumin 1.9 (*)    Alkaline Phosphatase 146 (*)    GFR calc non Af Amer 7 (*)    GFR calc Af Amer 8 (*)    All other components within normal limits  CBC WITH DIFFERENTIAL/PLATELET - Abnormal; Notable for the following:    RBC 3.56 (*)    Hemoglobin 9.8 (*)    HCT 30.1 (*)    RDW 19.4 (*)     Eosinophils Relative 6 (*)    Basophils Relative 3 (*)    All other components within normal limits  LIPASE, BLOOD - Abnormal; Notable for the following:    Lipase 12 (*)    All other components within normal limits  URINALYSIS COMPLETEWITH MICROSCOPIC ([facility] ONLY)   creatinine level was elevated in comparison to previous laboratory work.  Radiology:   CLINICAL DATA: Abdominal pain, gastroparesis, nausea, vomiting, asthma, collagen vascular disease, diabetes mellitus, hypertension, end-stage renal disease on dialysis, COPD, chronic diastolic CHF, smoker  EXAM: DG ABDOMEN ACUTE W/ 1V CHEST  COMPARISON: 05/21/2015  FINDINGS: Enlargement of cardiac silhouette with pulmonary vascular congestion.  Mediastinal contours normal.  Mild pulmonary edema with increase in LEFT basilar pleural effusion and atelectasis.  Improved aeration at RIGHT base versus previous study.  Vascular stents identified in the LEFT axilla extending through the LEFT subclavian region.  Minimally prominent stool in colon.  Surgical clips RIGHT upper quadrant likely cholecystectomy.  Few scattered vascular calcifications.  Bowel gas pattern otherwise normal.  No evidence of bowel obstruction, bowel wall thickening, or free intraperitoneal air.  Diffuse osseous demineralization.  IMPRESSION: CHF with LEFT pleural effusion and basilar atelectasis.  Slightly prominent stool in colon.  Otherwise negative abdominal radiographs.    I personally reviewed the radiologic studies   Procedures: Patient had initial IV fluid bolus. She was given Phenergan and morphine for pain. She has had some symptomatic improvement.    Assessment:  Renal failure Hypokalemia Gastroparesis for persistent nausea and vomiting Mild pulmonary edema     Plan: Admission, spoke to the hospitalist team, further disposition and management depends upon their evaluation            Daymon Larsen,  MD 06/05/15 1925

## 2015-06-05 NOTE — ED Notes (Signed)
Lab called with K+ of 2.2, notified MD

## 2015-06-06 LAB — GLUCOSE, CAPILLARY
Glucose-Capillary: 55 mg/dL — ABNORMAL LOW (ref 65–99)
Glucose-Capillary: 125 mg/dL — ABNORMAL HIGH (ref 65–99)
Glucose-Capillary: 51 mg/dL — ABNORMAL LOW (ref 65–99)
Glucose-Capillary: 96 mg/dL (ref 65–99)
Glucose-Capillary: 100 mg/dL — ABNORMAL HIGH (ref 65–99)
Glucose-Capillary: 54 mg/dL — ABNORMAL LOW (ref 65–99)
Glucose-Capillary: 107 mg/dL — ABNORMAL HIGH (ref 65–99)
Glucose-Capillary: 51 mg/dL — ABNORMAL LOW (ref 65–99)

## 2015-06-06 LAB — CBC
HCT: 27.1 % — ABNORMAL LOW (ref 35.0–47.0)
Hemoglobin: 8.9 g/dL — ABNORMAL LOW (ref 12.0–16.0)
MCH: 27.9 pg (ref 26.0–34.0)
MCHC: 32.9 g/dL (ref 32.0–36.0)
MCV: 85 fL (ref 80.0–100.0)
Platelets: 120 10*3/uL — ABNORMAL LOW (ref 150–440)
RBC: 3.19 MIL/uL — ABNORMAL LOW (ref 3.80–5.20)
RDW: 19 % — ABNORMAL HIGH (ref 11.5–14.5)
WBC: 6.5 10*3/uL (ref 3.6–11.0)

## 2015-06-06 LAB — BASIC METABOLIC PANEL
Anion gap: 8 (ref 5–15)
BUN: 16 mg/dL (ref 6–20)
CO2: 28 mmol/L (ref 22–32)
Calcium: 8.8 mg/dL — ABNORMAL LOW (ref 8.9–10.3)
Chloride: 104 mmol/L (ref 101–111)
Creatinine, Ser: 6.44 mg/dL — ABNORMAL HIGH (ref 0.44–1.00)
GFR calc Af Amer: 8 mL/min — ABNORMAL LOW (ref >60)
GFR calc non Af Amer: 7 mL/min — ABNORMAL LOW (ref >60)
Glucose, Bld: 44 mg/dL — CL (ref 65–99)
Potassium: 4.9 mmol/L (ref 3.5–5.1)
Sodium: 140 mmol/L (ref 135–145)

## 2015-06-06 LAB — HEMOGLOBIN A1C: Hgb A1c MFr Bld: 3.2 % — ABNORMAL LOW (ref 4.0–6.0)

## 2015-06-06 MED ORDER — DEXTROSE 50 % IV SOLN
INTRAVENOUS | Status: AC
  Filled 2015-06-06: qty 50

## 2015-06-06 MED ORDER — METOCLOPRAMIDE HCL 5 MG/ML IJ SOLN
5.0000 mg | Freq: Four times a day (QID) | INTRAMUSCULAR | Status: DC
  Administered 2015-06-06 – 2015-06-07 (×3): 5 mg via INTRAVENOUS
  Filled 2015-06-06 (×3): qty 2
  Filled 2015-06-06: qty 1

## 2015-06-06 MED ORDER — METOCLOPRAMIDE HCL 5 MG/ML IJ SOLN
10.0000 mg | Freq: Four times a day (QID) | INTRAMUSCULAR | Status: DC
  Administered 2015-06-06: 10 mg via INTRAVENOUS
  Filled 2015-06-06: qty 2

## 2015-06-06 MED ORDER — DEXTROSE 50 % IV SOLN
1.0000 | Freq: Once | INTRAVENOUS | Status: AC
  Administered 2015-06-06: 50 mL via INTRAVENOUS

## 2015-06-06 MED ORDER — DEXTROSE 50 % IV SOLN
INTRAVENOUS | Status: AC
  Administered 2015-06-06: 50 mL
  Filled 2015-06-06: qty 50

## 2015-06-06 MED ORDER — EPOETIN ALFA 10000 UNIT/ML IJ SOLN
10000.0000 [IU] | INTRAMUSCULAR | Status: DC
  Administered 2015-06-06: 10000 [IU] via INTRAVENOUS

## 2015-06-06 MED ORDER — MORPHINE SULFATE (PF) 2 MG/ML IV SOLN: 1.0000 mg | Freq: Four times a day (QID) | INTRAVENOUS | Status: DC | PRN

## 2015-06-06 NOTE — Progress Notes (Signed)
[name] MRN: [id number] DOB: [birth year]   Cancelled Treatment:    Reason Eval/Treat Not Completed: Patient declined, no reason specified. Patient has been vomiting throughout this morning, and becomes nauseated even with movement of the bed and asks to attempt mobility evaluation at a later time. PT will continue to follow and re-attempt as appropriate.   Kerman Passey, PT, DPT    06/06/2015, 10:01 AM

## 2015-06-06 NOTE — Progress Notes (Signed)
Pre-hd tx 

## 2015-06-06 NOTE — Progress Notes (Signed)
Soap suds enema administered per MD order, approximately 500-ml instilled.  Patient tolerated well.  Unable to hold fluid very long; minimal amount of stool (one small piece).  Patient states she feels less bloated and has been passing gas.

## 2015-06-06 NOTE — Progress Notes (Signed)
Initial Nutrition Assessment   INTERVENTION:   Meals and Snacks: Cater to patient preferences once able to be advanced  Medical Food Supplement Therapy: RD notes on previous admissions pt not wanting supplements as it has caused diarrhea in the past.   NUTRITION DIAGNOSIS:   Inadequate oral intake related to altered GI function as evidenced by per patient/family report.   GOAL:   Patient will meet greater than or equal to 90% of their needs  MONITOR:    (Energy Intake, Digestive system, Anthropometrics, Electrolyte and Renal Profile)  REASON FOR ASSESSMENT:   Malnutrition Screening Tool    ASSESSMENT:   Pt admitted after missed HD session, per MD note at other doctor appointments. Pt also with n/v likely secondary to gastroparesis per MD note. Pt out of room at HD.   Past Medical History  Diagnosis Date  . Asthma   . Collagen vascular disease   . Diabetes mellitus without complication   . Hypertension   . Coronary artery disease     a. cath 2013: stenting to RCA (report not available); b. cath 2014: LM nl, pLAD 40%, mLAD nl, ost LCx 40%, mid LCx nl, pRCA 30% @ site of prior stent, mRCA 50%  . Diabetic neuropathy   . ESRD (end stage renal disease) on dialysis     M-W-F  . GERD (gastroesophageal reflux disease)   . Hx of pancreatitis 2015  . Myocardial infarction   . Pneumonia   . Mitral regurgitation     a. echo 10/2013: EF 62%, noWMA, mildly dilated LA, mild to mod MR/TR, GR1DD  . COPD (chronic obstructive pulmonary disease)   . chronic diastolic CHF 123XX123  . dialysis 2006    Diet Order:  Diet clear liquid Room service appropriate?: Yes; Fluid consistency:: Thin    Current Nutrition: Recorded po intake 100% of CL tray this am.   Food/Nutrition-Related History: Per MST pt with decreased appetite PTA. RD notes pt admitted with n/v.   Medications: Carafate, senokot, renvela, MVI, reglan, Creon, Novolog  Electrolyte/Renal Profile and Glucose Profile:    Recent Labs Lab 06/05/15 1600 06/06/15 0806  NA 140 140  K 2.9* 4.9  CL 98* 104  CO2 32 28  BUN 14 16  CREATININE 6.08* 6.44*  CALCIUM 9.1 8.8*  GLUCOSE 76 44*   Protein Profile:  Recent Labs Lab 06/05/15 1600  ALBUMIN 1.9*    Gastrointestinal Profile: Last BM: unknown   Nutrition-Focused Physical Exam Findings:  Unable to complete Nutrition-Focused physical exam at this time.    Weight Change: Per [facility] weight loss 8% since last month  however weight gain since the June.   Height:   Ht Readings from Last 1 Encounters:  06/05/15 5\' 5"  (1.651 m)    Weight:   Wt Readings from Last 1 Encounters:  06/06/15 181 lb 10.5 oz (82.4 kg)   Wt Readings from Last 10 Encounters:  06/06/15 181 lb 10.5 oz (82.4 kg)  05/23/15 183 lb 6.8 oz (83.2 kg)  05/14/15 197 lb (89.359 kg)  05/07/15 197 lb (89.359 kg)  04/25/15 189 lb 2.5 oz (85.8 kg)  04/10/15 190 lb (86.183 kg)  04/04/15 191 lb 2.2 oz (86.7 kg)  03/30/15 195 lb 5.2 oz (88.6 kg)  03/14/15 176 lb (79.833 kg)  03/09/15 176 lb 12.9 oz (80.2 kg)     Ideal Body Weight:   56.8kg  BMI:  Body mass index is 30.23 kg/(m^2).  Estimated Nutritional Needs:   Kcal:  BEE: 1160kcals, TEE: (IF  1.1-1.3)(AF 1.3) 1657-1959kcals, using IBW of 56.8kg  Protein:  68-85g protein (1.2-1.5g/kg)  Fluid:  UOP+1L  EDUCATION NEEDS:   No education needs identified at this time  [place], RD, LDN Pager [phone]

## 2015-06-06 NOTE — Progress Notes (Signed)
Spoke with Dr. Darvin Neighbours at patient's request regarding IV pain medication.  Orders received and entered.

## 2015-06-06 NOTE — Progress Notes (Signed)
Post tx 

## 2015-06-06 NOTE — Progress Notes (Signed)
HD tx start 

## 2015-06-06 NOTE — Progress Notes (Signed)
Paged Dr. Darvin Neighbours, no return call as of yet however spoke with Dr. Candiss Norse in person regarding hypoglycemia.  Amp of D50 given and FSBS will be rechecked in 15 min.

## 2015-06-06 NOTE — Progress Notes (Signed)
[place] [facility] [place] NAME: [name]    MR#:  [id number]  DATE OF BIRTH:  April 21, 1961  SUBJECTIVE:  CHIEF COMPLAINT:   Chief Complaint  Patient presents with  . Abdominal Pain   Still has vomiting and abd pain which are chronic but worse at this point. Unable to tolerate PO. On scheduled reglan at home. Cough. SOB improved from last admission  REVIEW OF SYSTEMS:  Review of Systems  Constitutional: Negative for fever, chills and malaise/fatigue.  HENT: Negative for ear discharge, ear pain and nosebleeds.   Eyes: Negative for blurred vision.  Respiratory: Positive for cough. Negative for sputum production, shortness of breath and wheezing.   Cardiovascular: Negative for chest pain, palpitations and leg swelling.  Gastrointestinal: Positive for nausea, vomiting and abdominal pain. Negative for diarrhea and constipation.       Chronic abdominal pain nausea and diarrhea  Genitourinary: Negative for dysuria.       Incontinent. Still makes urine.  Musculoskeletal: Negative for back pain.  Skin: Negative for rash.  Neurological: Negative for dizziness, sensory change, speech change, focal weakness, seizures and headaches.  Psychiatric/Behavioral: Negative for depression.    DRUG ALLERGIES:   Allergies  Allergen Reactions  . Ace Inhibitors Swelling  . Ativan [Lorazepam] Other (See Comments)    Reaction:  Hallucinations and headaches  . Codeine Nausea And Vomiting  . Compazine [Prochlorperazine Edisylate] Nausea And Vomiting  . Gabapentin Other (See Comments)    Reaction:  Unknown   . Losartan Other (See Comments)    Reaction:  Unknown   . Scopolamine Other (See Comments)    Reaction:  Unknown   . Zofran [Ondansetron Hcl] Other (See Comments)    Reaction:  hallucinations   . Oxycodone Anxiety  . Tape Rash    VITALS:  Blood pressure 144/55, pulse 63, temperature 98.1 F (36.7 C), temperature source Oral, resp. rate 18,  height 5\' 5"  (1.651 m), weight 82.01 kg (180 lb 12.8 oz), SpO2 100 %.  PHYSICAL EXAMINATION:  Physical Exam  GENERAL:  54 y.o.-year-old patient lying in the bed with no acute distress. In distress from nausea. EYES: Pupils equal, round, reactive to light and accommodation. No scleral icterus. Extraocular muscles intact.  HEENT: Head atraumatic, normocephalic. Oropharynx and nasopharynx clear.  NECK:  Supple, no jugular venous distention. No thyroid enlargement, no tenderness.  LUNGS: Normal breath sounds bilaterally, no wheezing, rales,rhonchi or crepitation. No use of accessory muscles of respiration. Decreased right basilar breath sounds CARDIOVASCULAR: S1, S2 normal. No murmurs, rubs, or gallops.  ABDOMEN: Soft, nondistended. Bowel sounds present. No organomegaly or mass. Epigastric tenderness. EXTREMITIES: No pedal edema, cyanosis, or clubbing. Positive left upper extremity AV fistula with good bruit and good thrill.  Generalized Abdominal discomfort on palpation NEUROLOGIC: Cranial nerves II through XII are intact. Muscle strength 5/5 in all extremities. Sensation intact. Gait not checked.  PSYCHIATRIC: The patient is alert and oriented x 3.  SKIN: No obvious rash, lesion, or ulcer.    LABORATORY PANEL:   CBC  Recent Labs Lab 06/06/15 0806  WBC 6.5  HGB 8.9*  HCT 27.1*  PLT 120*   ------------------------------------------------------------------------------------------------------------------  Chemistries   Recent Labs Lab 06/05/15 1600 06/06/15 0806  NA 140 140  K 2.9* 4.9  CL 98* 104  CO2 32 28  GLUCOSE 76 44*  BUN 14 16  CREATININE 6.08* 6.44*  CALCIUM 9.1 8.8*  AST 25  --   ALT 19  --  ALKPHOS 146*  --   BILITOT 0.7  --    ------------------------------------------------------------------------------------------------------------------  Cardiac Enzymes No results for input(s): TROPONINI in the last 168  hours. ------------------------------------------------------------------------------------------------------------------  RADIOLOGY:  Dg Abd Acute W/chest  06/05/2015   CLINICAL DATA:  Abdominal pain, gastroparesis, nausea, vomiting, asthma, collagen vascular disease, diabetes mellitus, hypertension, end-stage renal disease on dialysis, COPD, chronic diastolic CHF, smoker  EXAM: DG ABDOMEN ACUTE W/ 1V CHEST  COMPARISON:  05/21/2015  FINDINGS: Enlargement of cardiac silhouette with pulmonary vascular congestion.  Mediastinal contours normal.  Mild pulmonary edema with increase in LEFT basilar pleural effusion and atelectasis.  Improved aeration at RIGHT base versus previous study.  Vascular stents identified in the LEFT axilla extending through the LEFT subclavian region.  Minimally prominent stool in colon.  Surgical clips RIGHT upper quadrant likely cholecystectomy.  Few scattered vascular calcifications.  Bowel gas pattern otherwise normal.  No evidence of bowel obstruction, bowel wall thickening, or free intraperitoneal air.  Diffuse osseous demineralization.  IMPRESSION: CHF with LEFT pleural effusion and basilar atelectasis.  Slightly prominent stool in colon.  Otherwise negative abdominal radiographs.   Electronically Signed   By: Lavonia Dana M.D.   On: 06/05/2015 16:11    EKG:   Orders placed or performed during the hospital encounter of 05/21/15  . EKG 12-Lead  . EKG 12-Lead  . EKG    ASSESSMENT AND PLAN:   54 year old female with past medical history of end-stage renal disease on M-W-F hemodialysis, hypertension, diastolic CHF, secondary hyperparathyroidism, chronic pancreatitis, diabetic neuropathy, diabetes, diabetic gastroparesis who presents to the hospital due to vomiting  * Diabetic gastroparesis Change Reglan to IV scheduled Consult Dr. Vira Agar who she sees as OP for further input.  * End-stage renal disease on Monday, Wednesday, Friday hemodialysis -Consult nephrology. -  Missed HD yesterday  * generalized weakness  * chronic pancreatitis-has chronic nausea and diarrhea. Continue her pancreatic supplements.  * hypertension-continue Coreg, Norvasc  * hyperlipidemia-continue atorvastatin.  * IDDM Episodes of hypoglycemia due to decreased oral intake Monitor Off long acting insulin  * GERD-continue Protonix  * AOCD  * DVT prophylaxis-on subcutaneous heparin  All the records are reviewed and case discussed with Care Management/Social Workerr. Management plans discussed with the patient, family and they are in agreement.  CODE STATUS: Full code  TOTAL TIME TAKING CARE OF THIS PATIENT: 35 minutes.   POSSIBLE D/C 1-2, DEPENDING ON CLINICAL CONDITION.   [name] R M.D on 06/06/2015 at 10:23 AM  Between 7am to 6pm - Pager - [phone]  After 6pm go to [email or web address] - password [email or web address] [facility]  Office  [phone]  CC: Primary care physician; Ellamae Sia, MD

## 2015-06-06 NOTE — Care Management Note (Signed)
Patients outpatient dialysis schedule is MWF [facility].  Todays notes appear that patient my discharge today.  Updated records will be sent to center once there is a DC summary.  [name] Liaison [phone]

## 2015-06-06 NOTE — Progress Notes (Signed)
Post hd tx 

## 2015-06-06 NOTE — Consult Note (Signed)
GI Inpatient Consult Note  Reason for Consult: Gastroparesis   Attending Requesting Consult: Sudini  History of Present Illness: [name] is a 54 y.o. female with a history of ESRD on dialysis, DM, diastolic HF, CAD, COPD, chronic pancreatitis, C. Diff colitis, and gastroparesis on Reglan, admitted for intractable nausea and vomiting.  Patient has been followed by Robin November, NP with [facility], for these issues.  She presented to the [facility] ED with complaints of nausea, vomiting, and epigastric and periumbilical pain.  No additional symptoms like fevers, chills, dysphagia, hematemesis, bowel changes, hematochezia, and melena.  Of note, she was hospitalized 05/21/15 for PNA and chronic nausea.  CT abd/pelvis 05/07/15 showed generalized anasarca with increasing soft tissue edema, bilateral pleural effusions, and ascites.  There was also some suggestion of mild ongoing colitis dut to distal colic wall thickening.  In the ED, pertinent labs include Hgb 9.8, platelets 120, potassium 2.9,  total protein 6.3, albumin 1.9, lipase 12. Abd XR revealed slightly prominent stool in colon, otherwise negative.  She was started on IV fluids, Phenergan, and Morphine, and admitted for further evaluation and management.    [name] reports continues to report nausea and epigastric abdominal pain, moderately improved with IV Phenergan and Morphine.  Prior to admission, patient states she had not been able to keep anything down by mouth.  She still feels like she is might vomit, but has not vomited in a few hours.  No hematemesis or bilious emesis.  She also reports a few episodes of loose stools over the last few days, but no diarrhea, hematochezia, or melena.  Last colonoscopy: 05/19/13 [name]) - flat 54mm polyp at hepatic flexure (TA), 74mm sessile polyp in transverse colon (hyperplastic), two sessile 22mm polyps in anus (condyloma acuminatum), multiple small-mouthed diverticula throughout the colon Last endosocpy:  03/08/15 [name]) - hypopharynx with small amount of blood, normal esophagus, localized moderate inflammation with erosions, erythema, and granularity in gastric antrum, normal duodenum.  Past Medical History:  Past Medical History  Diagnosis Date  . Asthma   . Collagen vascular disease   . Diabetes mellitus without complication   . Hypertension   . Coronary artery disease     a. cath 2013: stenting to RCA (report not available); b. cath 2014: LM nl, pLAD 40%, mLAD nl, ost LCx 40%, mid LCx nl, pRCA 30% @ site of prior stent, mRCA 50%  . Diabetic neuropathy   . ESRD (end stage renal disease) on dialysis     M-W-F  . GERD (gastroesophageal reflux disease)   . Hx of pancreatitis 2015  . Myocardial infarction   . Pneumonia   . Mitral regurgitation     a. echo 10/2013: EF 62%, noWMA, mildly dilated LA, mild to mod MR/TR, GR1DD  . COPD (chronic obstructive pulmonary disease)   . chronic diastolic CHF 123XX123  . dialysis 2006    Problem List: Patient Active Problem List   Diagnosis Date Noted  . Pneumonia 05/21/2015  . Hypoglycemia 04/24/2015  . Unresponsiveness 04/24/2015  . Bradycardia 04/24/2015  . Hypothermia 04/24/2015  . Acute respiratory failure 04/24/2015  . Acute diastolic CHF (congestive heart failure) 04/05/2015  . Diabetic gastroparesis 04/05/2015  . Hypokalemia 04/05/2015  . Generalized weakness 04/05/2015  . Acute pulmonary edema 04/03/2015  . Nausea and vomiting 04/03/2015  . Hypoglycemia associated with diabetes 04/03/2015  . Anemia of chronic disease 04/03/2015  . Secondary hyperparathyroidism 04/03/2015  . Pressure ulcer 04/02/2015  . Acute respiratory failure with hypoxia 04/01/2015  .  Adjustment disorder with anxiety 03/14/2015  . Somatic symptom disorder, mild 03/08/2015  . Coronary artery disease involving native coronary artery of native heart without angina pectoris   . Nausea & vomiting 03/06/2015  . Abdominal pain 03/06/2015  . DM (diabetes  mellitus) 03/06/2015  . HTN (hypertension) 03/06/2015  . Gastroparesis 02/24/2015  . Pleural effusion 02/19/2015  . HCAP (healthcare-associated pneumonia) 02/19/2015  . End-stage renal disease on hemodialysis 02/19/2015    Past Surgical History: Past Surgical History  Procedure Laterality Date  . Cholecystectomy    . Appendectomy    . Abdominal hysterectomy    . Dialysis fistula creation Left     upper arm  . Esophagogastroduodenoscopy N/A 03/08/2015    Procedure: ESOPHAGOGASTRODUODENOSCOPY (EGD);  Surgeon: Robin Silvas, MD;  Location: [facility] ENDOSCOPY;  Service: Endoscopy;  Laterality: N/A;    Allergies: Allergies  Allergen Reactions  . Ace Inhibitors Swelling  . Ativan [Lorazepam] Other (See Comments)    Reaction:  Hallucinations and headaches  . Codeine Nausea And Vomiting  . Compazine [Prochlorperazine Edisylate] Nausea And Vomiting  . Gabapentin Other (See Comments)    Reaction:  Unknown   . Losartan Other (See Comments)    Reaction:  Unknown   . Scopolamine Other (See Comments)    Reaction:  Unknown   . Zofran [Ondansetron Hcl] Other (See Comments)    Reaction:  hallucinations   . Oxycodone Anxiety  . Tape Rash    Home Medications: Prescriptions prior to admission  Medication Sig Dispense Refill Last Dose  . albuterol (PROVENTIL HFA;VENTOLIN HFA) 108 (90 BASE) MCG/ACT inhaler Inhale 4-6 puffs by mouth every 4 hours as needed for wheezing, cough, and/or shortness of breath (Patient taking differently: Inhale 4-6 puffs into the lungs every 4 (four) hours as needed for wheezing or shortness of breath (or cough). ) 1 Inhaler 1 PRN at PRN  . albuterol (PROVENTIL) (2.5 MG/3ML) 0.083% nebulizer solution Take 2.5 mg by nebulization every 4 (four) hours as needed for wheezing or shortness of breath.   PRN at PRN  . amLODipine (NORVASC) 10 MG tablet Take 10 mg by mouth daily.   unknown at unknown  . aspirin EC 81 MG tablet Take 81 mg by mouth daily.   unknown at unknown  .  atorvastatin (LIPITOR) 20 MG tablet Take 20 mg by mouth every evening.   unknown at unknown  . B Complex-C-Biotin-E-FA (RENATABS PO) Take 1 tablet by mouth daily.   unknown at unknown  . carvedilol (COREG) 25 MG tablet Take 25 mg by mouth 2 (two) times daily.   unknown at unknown  . cetirizine (ZYRTEC) 10 MG tablet Take 10 mg by mouth daily.   unknown at unknown  . citalopram (CELEXA) 20 MG tablet Take 20 mg by mouth daily.   unknown at unknown  . clopidogrel (PLAVIX) 75 MG tablet Take 75 mg by mouth daily.   unknown at unknown  . guaiFENesin-dextromethorphan (ROBITUSSIN DM) 100-10 MG/5ML syrup Take 10 mLs by mouth every 6 (six) hours as needed for cough. 118 mL 0 PRN at PRN  . HYDROcodone-acetaminophen (NORCO/VICODIN) 5-325 MG per tablet Take 1 tablet by mouth 3 (three) times daily as needed for moderate pain.    PRN at PRN  . insulin aspart (NOVOLOG) 100 UNIT/ML injection Inject 2-10 Units into the skin 3 (three) times daily as needed for high blood sugar. Pt uses as needed per sliding scale:    151-200:  2 units  201-250:  4 units 251-300:  6  units 301-350:  8 units  351-401:  10 units and notify MD.   PRN at PRN  . levofloxacin (LEVAQUIN) 750 MG tablet Take 750 mg by mouth every Monday, Wednesday, and Friday.   unknown at unknown  . lipase/protease/amylase (CREON) 12000 UNITS CPEP capsule Take 36,000 Units by mouth 3 (three) times daily with meals.    unknown at unknown  . metoCLOPramide (REGLAN) 5 MG tablet Take 1 tablet (5 mg total) by mouth 4 (four) times daily -  before meals and at bedtime. 40 tablet 0 unknown at unknown  . nitroGLYCERIN (NITROSTAT) 0.4 MG SL tablet Place 0.4 mg under the tongue every 5 (five) minutes x 3 doses as needed for chest pain.    PRN at PRN  . pantoprazole (PROTONIX) 40 MG tablet Take 40 mg by mouth daily.   unknown at unknown  . promethazine (PHENERGAN) 12.5 MG tablet Take 12.5 mg by mouth every 6 (six) hours as needed for nausea or vomiting.   PRN at PRN  .  ranitidine (ZANTAC) 150 MG tablet Take 150 mg by mouth daily.    unknown at unknown  . ranolazine (RANEXA) 1000 MG SR tablet Take 1,000 mg by mouth 2 (two) times daily.   unknown at unknown  . sevelamer carbonate (RENVELA) 800 MG tablet Take 800 mg by mouth 3 (three) times daily with meals.    unknown at unknown  . sucralfate (CARAFATE) 1 G tablet Take 1 tablet (1 g total) by mouth 4 (four) times daily -  with meals and at bedtime. 120 tablet 0 unknown at unknown  . Vitamin D, Cholecalciferol, 400 UNITS TABS Take 400 Units by mouth daily.    unknown at unknown   Home medication reconciliation was completed with the patient.   Scheduled Inpatient Medications:   . amLODipine  10 mg Oral Daily  . aspirin EC  81 mg Oral Daily  . atorvastatin  20 mg Oral QPM  . carvedilol  25 mg Oral BID  . citalopram  20 mg Oral Daily  . clopidogrel  75 mg Oral Daily  . docusate sodium  100 mg Oral BID  . epoetin (EPOGEN/PROCRIT) injection  10,000 Units Intravenous Q M,W,F-HD  . heparin  5,000 Units Subcutaneous 3 times per day  . insulin aspart  0-5 Units Subcutaneous QHS  . insulin aspart  0-9 Units Subcutaneous TID WC  . lipase/protease/amylase  36,000 Units Oral TID WC  . loratadine  10 mg Oral Daily  . metoCLOPramide (REGLAN) injection  10 mg Intravenous 4 times per day  . multivitamin  1 tablet Oral Daily  . pantoprazole  40 mg Oral Daily  . ranolazine  1,000 mg Oral BID  . senna  1 tablet Oral BID  . sevelamer carbonate  800 mg Oral TID WC  . sodium chloride  3 mL Intravenous Q12H  . sucralfate  1 g Oral TID WC & HS  . Vitamin D (Cholecalciferol)  400 Units Oral Daily    Continuous Inpatient Infusions:     PRN Inpatient Medications:  acetaminophen **OR** acetaminophen, albuterol, alum & mag hydroxide-simeth, guaiFENesin-dextromethorphan, HYDROcodone-acetaminophen, nitroGLYCERIN, promethazine, promethazine  Family History: family history includes Diabetes in her mother; Kidney disease in  her mother.   Social History:   reports that she has been smoking Cigarettes.  She has been smoking about 0.50 packs per day. She has never used smokeless tobacco. She reports that she does not drink alcohol or use illicit drugs.  Review of Systems: Constitutional: Weight  is stable.  Eyes: No changes in vision. ENT: No oral lesions, sore throat.  GI: see HPI.  Heme/Lymph: No easy bruising.  CV: No chest pain.  GU: No hematuria.  Integumentary: No rashes.  Neuro: No headaches.  Psych: No depression/anxiety.  Endocrine: No heat/cold intolerance.  Allergic/Immunologic: No urticaria.  Resp: No cough, SOB.  Musculoskeletal: No joint swelling.    Physical Examination: BP 136/58 mmHg  Pulse 61  Temp(Src) 98 F (36.7 C) (Oral)  Resp 13  Ht 5\' 5"  (1.651 m)  Wt 82.4 kg (181 lb 10.5 oz)  BMI 30.23 kg/m2  SpO2 100% Gen: NAD, alert and oriented x 4 HEENT: PEERLA, EOMI, Neck: supple, no JVD or thyromegaly Chest: CTA bilaterally, no wheezes, crackles, or other adventitious sounds CV: RRR, no m/g/c/r Abd: soft, nontender to palpation, ND, +BS in all four quadrants; no HSM, guarding, ridigity, or rebound tenderness Ext: no edema, well perfused with 2+ pulses, Skin: no rash or lesions noted Lymph: no LAD  Data: Lab Results  Component Value Date   WBC 6.5 06/06/2015   HGB 8.9* 06/06/2015   HCT 27.1* 06/06/2015   MCV 85.0 06/06/2015   PLT 120* 06/06/2015    Recent Labs Lab 06/05/15 1600 06/06/15 0806  HGB 9.8* 8.9*   Lab Results  Component Value Date   NA 140 06/06/2015   K 4.9 06/06/2015   CL 104 06/06/2015   CO2 28 06/06/2015   BUN 16 06/06/2015   CREATININE 6.44* 06/06/2015   Lab Results  Component Value Date   ALT 19 06/05/2015   AST 25 06/05/2015   ALKPHOS 146* 06/05/2015   BILITOT 0.7 06/05/2015   No results for input(s): APTT, INR, PTT in the last 168 hours.   Assessment/Plan: Ms. Harvick is a 54 y.o. female with a history of ESRD on dialysis, DM, diastolic  HF, CAD, COPD, chronic pancreatitis, C. Diff colitis, and gastroparesis on Reglan, admitted for intractable nausea and vomiting. Symptoms mildly improved since started on IV Phenergan and Morphine, patient still nauseous but no recent vomiting.  Patient also reports some epigastric pain but is non-tender to palpation.  CT scan 8/16 for nausea and vomiting unremarkable for acute etiology, but did show generalized anasarca with increasing soft tissue edema.  This bowel wall edema is likely the cause of her chronic nausea and vomiting, and malnutrition, especially in the setting of albumin 1.9.  Given this recent CT, no significant changes in labs, and no changes in symptoms, no further imagining is indicated at this time.  Continue symptomatic management with Phenergan, and nutrition assessment is recommended given her albumin.  Will continue to follow.  Recommendations:  Chronic nausea and vomiting - Continue symptomatic treatment with IV Phenergan as necessary - Assess nutritional status given very low albumin - Follow-up with [facility] (previously follow by Robin November, NP) as needed  Thank you for the consult. We will follow along with you. Please call with questions or concerns.  Lavera Guise, PA-C [facility] Phone: [phone] Pager: [phone]

## 2015-06-06 NOTE — Progress Notes (Signed)
HD tx completed.

## 2015-06-06 NOTE — Progress Notes (Signed)
Patient presents for abdominal pain, nausea, vomiting. She missed her dialysis treatment on Monday because she had 2 other doctors appointments. She did not reschedule for the next day. At present, she denies acute shortness of breath She states she has had small amount of loose stool    Objective:  Vital signs in last 24 hours:  Temp:  [97.5 F (36.4 C)-98.1 F (36.7 C)] 98.1 F (36.7 C) (09/14 0501) Pulse Rate:  [63-70] 63 (09/14 0501) Resp:  [18] 18 (09/14 0501) BP: (140-149)/(55-70) 144/55 mmHg (09/14 0501) SpO2:  [93 %-100 %] 100 % (09/14 0501) Weight:  [82.01 kg (180 lb 12.8 oz)-83.462 kg (184 lb)] 82.01 kg (180 lb 12.8 oz) (09/13 2019)  Weight change:  Filed Weights   06/05/15 1440 06/05/15 2019  Weight: 83.462 kg (184 lb) 82.01 kg (180 lb 12.8 oz)    Intake/Output: I/O last 3 completed shifts: In: 300 [P.O.:300] Out: 0    Intake/Output this shift:  Total I/O In: 438 [P.O.:438] Out: 0   Physical Exam: General: NAD, resting in bed  Head: Alopecia noted, atraumatic. Moist oral mucosal membranes  Eyes: Anicteric  Neck: Supple, trachea midline  Lungs:  Clear to auscultation normal effort  Heart: S1S2 no rubs  Abdomen:  Soft, mild diffuse tenderness, distended, BS present  Extremities:  1+ peripheral edema., 1+ right arm swelling.  Neurologic: Awake, alert, follows commands  Skin: No lesions  Access: LUE AVG    Basic Metabolic Panel:  Recent Labs Lab 06/05/15 1600 06/06/15 0806  NA 140 140  K 2.9* 4.9  CL 98* 104  CO2 32 28  GLUCOSE 76 44*  BUN 14 16  CREATININE 6.08* 6.44*  CALCIUM 9.1 8.8*    Liver Function Tests:  Recent Labs Lab 06/05/15 1600  AST 25  ALT 19  ALKPHOS 146*  BILITOT 0.7  PROT 6.3*  ALBUMIN 1.9*    Recent Labs Lab 06/05/15 1600  LIPASE 12*   No results for input(s): AMMONIA in the last 168 hours.  CBC:  Recent Labs Lab 06/05/15 1600 06/06/15 0806  WBC 4.1 6.5  NEUTROABS 1.9  --   HGB 9.8* 8.9*  HCT 30.1*  27.1*  MCV 84.6 85.0  PLT 196 120*    Cardiac Enzymes: No results for input(s): CKTOTAL, CKMB, CKMBINDEX, TROPONINI in the last 168 hours.  BNP: Invalid input(s): POCBNP  CBG:  Recent Labs Lab 06/05/15 2110 06/06/15 0823 06/06/15 0848 06/06/15 0927  GLUCAP 79 9* 50* 125*    Microbiology: Results for orders placed or performed during the hospital encounter of 05/21/15  Blood culture (routine x 2)     Status: None   Collection Time: 05/21/15  8:50 PM  Result Value Ref Range Status   Specimen Description BLOOD RIGHT ARM  Final   Special Requests BOTTLES DRAWN AEROBIC AND ANAEROBIC [place]  Final   Culture NO GROWTH 5 DAYS  Final   Report Status 05/26/2015 FINAL  Final  Blood culture (routine x 2)     Status: None   Collection Time: 05/21/15  8:52 PM  Result Value Ref Range Status   Specimen Description BLOOD RIGHT ARM  Final   Special Requests BOTTLES DRAWN AEROBIC AND ANAEROBIC 6CC  Final   Culture NO GROWTH 5 DAYS  Final   Report Status 05/26/2015 FINAL  Final    Coagulation Studies: No results for input(s): LABPROT, INR in the last 72 hours.  Urinalysis: No results for input(s): COLORURINE, LABSPEC, [place], [place], [place], [place], [place], [place], [place],  NITRITE, LEUKOCYTESUR in the last 72 hours.  Invalid input(s): APPERANCEUR    Imaging: Dg Abd Acute W/chest  06/05/2015   CLINICAL DATA:  Abdominal pain, gastroparesis, nausea, vomiting, asthma, collagen vascular disease, diabetes mellitus, hypertension, end-stage renal disease on dialysis, COPD, chronic diastolic CHF, smoker  EXAM: DG ABDOMEN ACUTE W/ 1V CHEST  COMPARISON:  05/21/2015  FINDINGS: Enlargement of cardiac silhouette with pulmonary vascular congestion.  Mediastinal contours normal.  Mild pulmonary edema with increase in LEFT basilar pleural effusion and atelectasis.  Improved aeration at RIGHT base versus previous study.  Vascular stents identified in the LEFT axilla extending through  the LEFT subclavian region.  Minimally prominent stool in colon.  Surgical clips RIGHT upper quadrant likely cholecystectomy.  Few scattered vascular calcifications.  Bowel gas pattern otherwise normal.  No evidence of bowel obstruction, bowel wall thickening, or free intraperitoneal air.  Diffuse osseous demineralization.  IMPRESSION: CHF with LEFT pleural effusion and basilar atelectasis.  Slightly prominent stool in colon.  Otherwise negative abdominal radiographs.   Electronically Signed   By: Lavonia Dana M.D.   On: 06/05/2015 16:11     Medications:     . amLODipine  10 mg Oral Daily  . aspirin EC  81 mg Oral Daily  . atorvastatin  20 mg Oral QPM  . carvedilol  25 mg Oral BID  . citalopram  20 mg Oral Daily  . clopidogrel  75 mg Oral Daily  . docusate sodium  100 mg Oral BID  . heparin  5,000 Units Subcutaneous 3 times per day  . insulin aspart  0-5 Units Subcutaneous QHS  . insulin aspart  0-9 Units Subcutaneous TID WC  . lipase/protease/amylase  36,000 Units Oral TID WC  . loratadine  10 mg Oral Daily  . metoCLOPramide (REGLAN) injection  10 mg Intravenous 4 times per day  . multivitamin  1 tablet Oral Daily  . pantoprazole  40 mg Oral Daily  . ranolazine  1,000 mg Oral BID  . senna  1 tablet Oral BID  . sevelamer carbonate  800 mg Oral TID WC  . sodium chloride  3 mL Intravenous Q12H  . sucralfate  1 g Oral TID WC & HS  . Vitamin D (Cholecalciferol)  400 Units Oral Daily   acetaminophen **OR** acetaminophen, albuterol, alum & mag hydroxide-simeth, guaiFENesin-dextromethorphan, HYDROcodone-acetaminophen, nitroGLYCERIN, promethazine, promethazine  Assessment/ Plan:  54 y.o. female with ESRD, DM (30 yrs with retinopathy, gastroparesis, neuropathy), OA, IBS, hyperlipidemia, h/o GI bleed, SHPTH, AOCD, diastolic heart failure ,hypertension, hyperlipidemia, anemia, CAD  Admitted 05/22/15 for nosocomial pneumonia, has history of tobacco abuse.  1.  ESRD on HD on MWF:   - We'll  arrange for dialysis today  2.  Nausea, vomiting, abdominal pain - Likely another episode of gastroparesis - Possible constipation - We'll switch as many medications to IV as possible - Enema  3.  SHPTH:  - Hold binders - Monitor phosphorus  4.  Anemia of CKD: - Monitor hemoglobin - Continue Procrit with dialysis  LOS: 1 Maleeka Sabatino 9/14/201610:56 AM

## 2015-06-07 LAB — BASIC METABOLIC PANEL
Anion gap: 4 — ABNORMAL LOW (ref 5–15)
BUN: 7 mg/dL (ref 6–20)
CO2: 31 mmol/L (ref 22–32)
Calcium: 8 mg/dL — ABNORMAL LOW (ref 8.9–10.3)
Chloride: 105 mmol/L (ref 101–111)
Creatinine, Ser: 3.75 mg/dL — ABNORMAL HIGH (ref 0.44–1.00)
GFR calc Af Amer: 14 mL/min — ABNORMAL LOW (ref >60)
GFR calc non Af Amer: 12 mL/min — ABNORMAL LOW (ref >60)
Glucose, Bld: 96 mg/dL (ref 65–99)
Potassium: 4.3 mmol/L (ref 3.5–5.1)
Sodium: 140 mmol/L (ref 135–145)

## 2015-06-07 LAB — GLUCOSE, CAPILLARY
Glucose-Capillary: 45 mg/dL — ABNORMAL LOW (ref 65–99)
Glucose-Capillary: 49 mg/dL — ABNORMAL LOW (ref 65–99)
Glucose-Capillary: 100 mg/dL — ABNORMAL HIGH (ref 65–99)
Glucose-Capillary: 82 mg/dL (ref 65–99)
Glucose-Capillary: 107 mg/dL — ABNORMAL HIGH (ref 65–99)
Glucose-Capillary: 97 mg/dL (ref 65–99)

## 2015-06-07 LAB — MAGNESIUM: Magnesium: 1.9 mg/dL (ref 1.7–2.4)

## 2015-06-07 MED ORDER — DOCUSATE SODIUM 100 MG PO CAPS: 100.0000 mg | ORAL_CAPSULE | Freq: Two times a day (BID) | ORAL | Status: DC

## 2015-06-07 MED ORDER — DEXTROSE-NACL 5-0.45 % IV SOLN
INTRAVENOUS | Status: DC
  Administered 2015-06-07: 03:00:00 via INTRAVENOUS

## 2015-06-07 MED ORDER — PROMETHAZINE HCL 25 MG RE SUPP: 25.0000 mg | Freq: Three times a day (TID) | RECTAL | Status: DC | PRN

## 2015-06-07 MED ORDER — DEXTROSE 50 % IV SOLN
1.0000 | Freq: Once | INTRAVENOUS | Status: AC
  Administered 2015-06-07: 50 mL via INTRAVENOUS
  Filled 2015-06-07: qty 50

## 2015-06-07 NOTE — Evaluation (Signed)
Physical Therapy Evaluation Patient Details Name: [name] MRN: [id number] DOB: 1961/02/26 Today's Date: 06/07/2015   History of Present Illness  54 yo female with onset of gastroparesis, anasarca, CHF with pleural effusion L lung, abd pain with vomiting has PMHx:  COPD, CHF, ESRD, DM, HTN, collagen vascular disease, asthma, MI has EF 62%.    Clinical Impression  Pt is planning to go home with family and comfortable with HHPT coming to see her.  Her limits are for endurance and LE strength and will focus on same to get her into better walking and more independent transfers/bed mob.    Follow Up Recommendations Home health PT;Supervision/Assistance - 24 hour    Equipment Recommendations  None recommended by PT (Pt asking for a reacher)    Recommendations for Other Services       Precautions / Restrictions Precautions Precautions: Fall Precaution Comments: oxygen Restrictions Weight Bearing Restrictions: No      Mobility  Bed Mobility Overal bed mobility: Needs Assistance Bed Mobility: Supine to Sit;Sit to Supine     Supine to sit: Min assist Sit to supine: Min assist   General bed mobility comments: assist for LE's and trunk minimally  Transfers Overall transfer level: Needs assistance Equipment used: Rolling walker (2 wheeled) Transfers: Sit to/from [facility] Sit to Stand: Min guard Stand pivot transfers: Min guard       General transfer comment: cues for safety to set up transitions esp hand placement  Ambulation/Gait Ambulation/Gait assistance: Min guard;Supervision Ambulation Distance (Feet): 18 Feet Assistive device: Rolling walker (2 wheeled) Gait Pattern/deviations: Step-through pattern;Shuffle;Wide base of support Gait velocity: reduced Gait velocity interpretation: Below normal speed for age/gender    Stairs            Wheelchair Mobility    Modified Rankin (Stroke Patients Only)       Balance Overall balance  assessment: Needs assistance Sitting-balance support: Feet supported Sitting balance-Leahy Scale: Good   Postural control: Posterior lean Standing balance support: Bilateral upper extremity supported Standing balance-Leahy Scale: Fair                               Pertinent Vitals/Pain Pain Assessment: No/denies pain    Home Living Family/patient expects to be discharged to:: Private residence Living Arrangements: Children Available Help at Discharge: Available PRN/intermittently;Family Type of Home: Apartment Home Access: Level entry     Home Layout: Two level;Able to live on main level with bedroom/bathroom Home Equipment: Wheelchair - [employer] - 2 wheels;Cane - single point;Bedside commode      Prior Function Level of Independence: Independent with assistive device(s)         Comments: has children and other family and friends in nearly all the time.     Hand Dominance        Extremity/Trunk Assessment   Upper Extremity Assessment: Overall WFL for tasks assessed           Lower Extremity Assessment: Generalized weakness      Cervical / Trunk Assessment: Normal  Communication   Communication: No difficulties  Cognition Arousal/Alertness: Awake/alert Behavior During Therapy: WFL for tasks assessed/performed Overall Cognitive Status: Within Functional Limits for tasks assessed                      General Comments General comments (skin integrity, edema, etc.): Pt is reluctant to get up initially but is nearly supervision with help from her  PT for O2 to mobilize.    Exercises        Assessment/Plan    PT Assessment Patient needs continued PT services  PT Diagnosis Difficulty walking   PT Problem List Decreased strength;Decreased range of motion;Decreased activity tolerance;Decreased balance;Decreased coordination;Decreased mobility;Cardiopulmonary status limiting activity;Obesity  PT Treatment Interventions DME  instruction;Gait training;Functional mobility training;Therapeutic activities;Therapeutic exercise;Balance training;Neuromuscular re-education;Patient/family education   PT Goals (Current goals can be found in the Care Plan section) Acute Rehab PT Goals Patient Stated Goal: to get home PT Goal Formulation: With patient Time For Goal Achievement: 06/21/15 Potential to Achieve Goals: Good    Frequency Min 2X/week   Barriers to discharge Decreased caregiver support children are intermittently available    Co-evaluation               End of Session Equipment Utilized During Treatment: Gait belt;Oxygen Activity Tolerance: Patient tolerated treatment well Patient left: in bed;with call bell/phone within reach;with bed alarm set Nurse Communication: Mobility status         Time: [id number] PT Time Calculation (min) (ACUTE ONLY): 29 min   Charges:   PT Evaluation $Initial PT Evaluation Tier I: 1 Procedure PT Treatments $Gait Training: 8-22 mins   PT G Codes:        Ramond Dial Jul 04, 2015, 11:29 AM   Mee Hives, PT MS Acute Rehab Dept. Number: [facility] [phone] and [place] [phone]

## 2015-06-07 NOTE — Progress Notes (Signed)
Inpatient Diabetes Program Recommendations  AACE/ADA: New Consensus Statement on Inpatient Glycemic Control (2015)  Target Ranges:  Prepandial:   less than 140 mg/dL      Peak postprandial:   less than 180 mg/dL (1-2 hours)      Critically ill patients:  140 - 180 mg/dL   Results for [name], [name] (MRN [id number]) as of 06/07/2015 10:46  Ref. Range 06/06/2015 08:23 06/06/2015 08:48 06/06/2015 09:27 06/06/2015 11:14 06/06/2015 16:15 06/06/2015 21:05 06/06/2015 21:31 06/06/2015 22:07 06/07/2015 02:54 06/07/2015 05:13 06/07/2015 06:45 06/07/2015 07:41  Glucose-Capillary Latest Ref Range: 65-99 mg/dL 55 (L) 51 (L) 125 (H) 96 100 (H) 54 (L) 51 (L) 107 (H) 45 (L) 49 (L) 100 (H) 82   Results for [name], [name] (MRN [id number]) as of 06/07/2015 10:46  Ref. Range 06/05/2015 16:00  Hemoglobin A1C Latest Ref Range: 4.0-6.0 % 3.2 (L)   Review of Glycemic Control  Diabetes history: DM2 Outpatient Diabetes medications: Novolog 2-10 units TID with meals if needed for high glucose Current orders for Inpatient glycemic control: Novolog 0-9 units TID with meals, Novolog 0-5 units HS  Inpatient Diabetes Program Recommendations: Inpatient Endocrinologist Consult: Patient continues to have recurrent hypoglycemia despite not receiving any insulin. Glucose 45 mg/dl at 2:54 am and 49 mg/dl at 5:13 today. Also noted A1C 3.2% on 06/05/15 which equates to an average glucose of 45 mg/dl. Please consider consulting Endocrinologist for further workup regarding glycemic control.   Thanks, Barnie Alderman, RN, MSN, CCRN, CDE Diabetes Coordinator Inpatient Diabetes Program [phone] (Team Pager from [place] to [place]) [phone] ([facility] office) [phone] [place] office) [phone] [facility] office)

## 2015-06-07 NOTE — Care Management Important Message (Signed)
Important Message  Patient Details  Name: [name] MRN: [id number] Date of Birth: December 19, 1960   Medicare Important Message Given:  Yes-second notification given    [name] A Allmond 06/07/2015, 9:57 AM

## 2015-06-07 NOTE — Progress Notes (Signed)
Patient with blood sugar of 54 around 9pm . Juice was given blood sugar rechecked at 9:20pm and was 51. 1 amp of D50 given around 9:40pm. Blood sugar was up to 107 by 10:08pm. Dr Jannifer Franklin was notified off the situation and stated that he would put an order in for her blood sugar to be checked over in the morning.

## 2015-06-07 NOTE — Care Management (Signed)
Spoke with son of the patient [name] who stated that the patient is currently beeing seen by [facility] RN and PT and that he would like to continue with these services. Contacted Sharmon Revere at [facility] who stated that they would accept patient. Paged MD who will write for RN and PT. Faxed discharge summary to [facility]. Contacted dialysis liaison [name] at [facility] to inform of patient discharge.

## 2015-06-07 NOTE — Discharge Instructions (Addendum)
°  DIET:  Renal diet Small meals with liquids and soft diet for 2-3 days after discharge  DISCHARGE CONDITION:  Stable  ACTIVITY:  Activity as tolerated      DISCHARGE LOCATION:  home   If you experience worsening of your admission symptoms, develop shortness of breath, life threatening emergency, suicidal or homicidal thoughts you must seek medical attention immediately by calling 911 or calling your MD immediately  if symptoms less severe.  You Must read complete instructions/literature along with all the possible adverse reactions/side effects for all the Medicines you take and that have been prescribed to you. Take any new Medicines after you have completely understood and accpet all the possible adverse reactions/side effects.   Please note  You were cared for by a hospitalist during your hospital stay. If you have any questions about your discharge medications or the care you received while you were in the hospital after you are discharged, you can call the unit and asked to speak with the hospitalist on call if the hospitalist that took care of you is not available. Once you are discharged, your primary care physician will handle any further medical issues. Please note that NO REFILLS for any discharge medications will be authorized once you are discharged, as it is imperative that you return to your primary care physician (or establish a relationship with a primary care physician if you do not have one) for your aftercare needs so that they can reassess your need for medications and monitor your lab values.  Arrive to all follow up appointments. Follow instructions for prescription medication. Call physician for any questions.

## 2015-06-07 NOTE — Progress Notes (Signed)
Inpatient Diabetes Program Recommendations  AACE/ADA: New Consensus Statement on Inpatient Glycemic Control (2015)  Target Ranges:  Prepandial:   less than 140 mg/dL      Peak postprandial:   less than 180 mg/dL (1-2 hours)      Critically ill patients:  140 - 180 mg/dL   Review of Glycemic Control  Spoke with patient at the bedside.  She reports having several low blood sugars while at home.  She has a large support system that lives with her.  She keeps regular soda with her at all times.  She knows how to treat low blood sugars and then follows the low blood sugars up with crackers and peanut butter to stabilize the sugar.    May consider ordering a Glucagon kit for the patient for discharge.   Gentry Fitz, RN, BA, MHA, CDE Diabetes Coordinator Inpatient Diabetes Program  [phone] (Team Pager) [phone] ([place]) 06/07/2015 1:19 PM

## 2015-06-07 NOTE — Progress Notes (Signed)
Patient presents for abdominal pain, nausea, vomiting. She missed her dialysis treatment on Monday because she had 2 other doctors appointments. She did not reschedule for the next day. Underwent HD yesterday [place] well this AM Currently on D5 for low blood sugars  Appetite is poor. Tolerating clears  Objective:  Vital signs in last 24 hours:  Temp:  [97.5 F (36.4 C)-98.5 F (36.9 C)] 98.1 F (36.7 C) (09/15 0553) Pulse Rate:  [58-131] 65 (09/15 0553) Resp:  [11-18] 16 (09/15 0553) BP: (126-148)/(47-74) 139/48 mmHg (09/15 0553) SpO2:  [94 %-100 %] 98 % (09/15 0553) Weight:  [81.4 kg (179 lb 7.3 oz)-82.4 kg (181 lb 10.5 oz)] 81.4 kg (179 lb 7.3 oz) (09/14 1542)  Weight change: -1.062 kg (-2 lb 5.5 oz) Filed Weights   06/05/15 2019 06/06/15 1145 06/06/15 1542  Weight: 82.01 kg (180 lb 12.8 oz) 82.4 kg (181 lb 10.5 oz) 81.4 kg (179 lb 7.3 oz)    Intake/Output: I/O last 3 completed shifts: In: [id number] [P.O.:738] Out: 1000 [Other:1000]   Intake/Output this shift:  Total I/O In: 220 [P.O.:220] Out: -   Physical Exam: General: NAD, resting in bed  Head: Alopecia noted, atraumatic. Moist oral mucosal membranes  Eyes: Anicteric  Neck: Supple, trachea midline  Lungs:  Clear to auscultation normal effort  Heart: S1S2 no rubs  Abdomen:  Soft, mild diffuse tenderness, distended, BS present  Extremities:  1+ peripheral edema., 1+ right arm swelling.  Neurologic: Awake, alert, follows commands  Skin: No lesions  Access: LUE AVG    Basic Metabolic Panel:  Recent Labs Lab 06/05/15 1600 06/06/15 0806 06/07/15 0715  NA 140 140 140  K 2.9* 4.9 4.3  CL 98* 104 105  CO2 32 28 31  GLUCOSE 76 44* 96  BUN 14 16 7   CREATININE 6.08* 6.44* 3.75*  CALCIUM 9.1 8.8* 8.0*  MG  --   --  1.9    Liver Function Tests:  Recent Labs Lab 06/05/15 1600  AST 25  ALT 19  ALKPHOS 146*  BILITOT 0.7  PROT 6.3*  ALBUMIN 1.9*    Recent Labs Lab 06/05/15 1600  LIPASE 12*   No results  for input(s): AMMONIA in the last 168 hours.  CBC:  Recent Labs Lab 06/05/15 1600 06/06/15 0806  WBC 4.1 6.5  NEUTROABS 1.9  --   HGB 9.8* 8.9*  HCT 30.1* 27.1*  MCV 84.6 85.0  PLT 196 120*    Cardiac Enzymes: No results for input(s): CKTOTAL, CKMB, CKMBINDEX, TROPONINI in the last 168 hours.  BNP: Invalid input(s): POCBNP  CBG:  Recent Labs Lab 06/06/15 2207 06/07/15 0254 06/07/15 0513 06/07/15 0645 06/07/15 0741  GLUCAP 107* 45* 11* 100* 41    Microbiology: Results for orders placed or performed during the hospital encounter of 05/21/15  Blood culture (routine x 2)     Status: None   Collection Time: 05/21/15  8:50 PM  Result Value Ref Range Status   Specimen Description BLOOD RIGHT ARM  Final   Special Requests BOTTLES DRAWN AEROBIC AND ANAEROBIC [place]  Final   Culture NO GROWTH 5 DAYS  Final   Report Status 05/26/2015 FINAL  Final  Blood culture (routine x 2)     Status: None   Collection Time: 05/21/15  8:52 PM  Result Value Ref Range Status   Specimen Description BLOOD RIGHT ARM  Final   Special Requests BOTTLES DRAWN AEROBIC AND ANAEROBIC 6CC  Final   Culture NO GROWTH 5 DAYS  Final  Report Status 05/26/2015 FINAL  Final    Coagulation Studies: No results for input(s): LABPROT, INR in the last 72 hours.  Urinalysis: No results for input(s): COLORURINE, LABSPEC, PHURINE, GLUCOSEU, HGBUR, BILIRUBINUR, KETONESUR, PROTEINUR, UROBILINOGEN, NITRITE, LEUKOCYTESUR in the last 72 hours.  Invalid input(s): APPERANCEUR    Imaging: Dg Abd Acute W/chest  06/05/2015   CLINICAL DATA:  Abdominal pain, gastroparesis, nausea, vomiting, asthma, collagen vascular disease, diabetes mellitus, hypertension, end-stage renal disease on dialysis, COPD, chronic diastolic CHF, smoker  EXAM: DG ABDOMEN ACUTE W/ 1V CHEST  COMPARISON:  05/21/2015  FINDINGS: Enlargement of cardiac silhouette with pulmonary vascular congestion.  Mediastinal contours normal.  Mild pulmonary edema  with increase in LEFT basilar pleural effusion and atelectasis.  Improved aeration at RIGHT base versus previous study.  Vascular stents identified in the LEFT axilla extending through the LEFT subclavian region.  Minimally prominent stool in colon.  Surgical clips RIGHT upper quadrant likely cholecystectomy.  Few scattered vascular calcifications.  Bowel gas pattern otherwise normal.  No evidence of bowel obstruction, bowel wall thickening, or free intraperitoneal air.  Diffuse osseous demineralization.  IMPRESSION: CHF with LEFT pleural effusion and basilar atelectasis.  Slightly prominent stool in colon.  Otherwise negative abdominal radiographs.   Electronically Signed   By: Lavonia Dana M.D.   On: 06/05/2015 16:11     Medications:     . amLODipine  10 mg Oral Daily  . aspirin EC  81 mg Oral Daily  . atorvastatin  20 mg Oral QPM  . carvedilol  25 mg Oral BID  . citalopram  20 mg Oral Daily  . clopidogrel  75 mg Oral Daily  . docusate sodium  100 mg Oral BID  . epoetin (EPOGEN/PROCRIT) injection  10,000 Units Intravenous Q M,W,F-HD  . heparin  5,000 Units Subcutaneous 3 times per day  . lipase/protease/amylase  36,000 Units Oral TID WC  . loratadine  10 mg Oral Daily  . metoCLOPramide (REGLAN) injection  5 mg Intravenous 4 times per day  . multivitamin  1 tablet Oral Daily  . pantoprazole  40 mg Oral Daily  . ranolazine  1,000 mg Oral BID  . senna  1 tablet Oral BID  . sevelamer carbonate  800 mg Oral TID WC  . sodium chloride  3 mL Intravenous Q12H  . sucralfate  1 g Oral TID WC & HS  . Vitamin D (Cholecalciferol)  400 Units Oral Daily   acetaminophen **OR** acetaminophen, albuterol, alum & mag hydroxide-simeth, guaiFENesin-dextromethorphan, HYDROcodone-acetaminophen, morphine injection, nitroGLYCERIN, promethazine, promethazine  Assessment/ Plan:  54 y.o. female with ESRD, DM (30 yrs with retinopathy, gastroparesis, neuropathy), OA, IBS, hyperlipidemia, h/o GI bleed, SHPTH, AOCD,  diastolic heart failure ,hypertension, hyperlipidemia, anemia, CAD  Admitted 05/22/15 for nosocomial pneumonia, has history of tobacco abuse.  1.  ESRD on HD on MWF:   - dialysis tomorrow  2.  Nausea, vomiting,  - improved  3.  SHPTH:  - Hold binders - Monitor phosphorus  4.  Anemia of CKD: - Monitor hemoglobin - Continue Procrit with dialysis    LOS: 2 Quamaine Webb 9/15/201611:22 AM

## 2015-06-07 NOTE — Progress Notes (Signed)
Pt discharged to home. discharge instructions reviewed with pt and pt verbalized understanding of all instructions. All pt questions answered. IV removed, catheter in tact, gauze dressing applied. Pt left unit via wheelchair accompanied by staff. 

## 2015-06-07 NOTE — Progress Notes (Signed)
Blood sugar was 45 around 3am. DR [name] notified and gave new order to start Dextrose 5% and 0.45% NS at 89ml/hr. Will recheck blood sugar.

## 2015-06-08 NOTE — Discharge Summary (Signed)
[facility] NAME: [name]    MR#:  [id number]  DATE OF BIRTH:  1961/07/04  DATE OF ADMISSION:  06/05/2015 ADMITTING PHYSICIAN: Bettey Costa, MD  DATE OF DISCHARGE: 06/07/2015  5:00 PM  PRIMARY CARE PHYSICIAN: Ellamae Sia, MD    ADMISSION DIAGNOSIS:  Renal failure [N19]  DISCHARGE DIAGNOSIS:  Active Problems:   Nausea and vomiting   Diabetic gastroparesis   Hypokalemia   SECONDARY DIAGNOSIS:   Past Medical History  Diagnosis Date  . Asthma   . Collagen vascular disease   . Diabetes mellitus without complication   . Hypertension   . Coronary artery disease     a. cath 2013: stenting to RCA (report not available); b. cath 2014: LM nl, pLAD 40%, mLAD nl, ost LCx 40%, mid LCx nl, pRCA 30% @ site of prior stent, mRCA 50%  . Diabetic neuropathy   . ESRD (end stage renal disease) on dialysis     M-W-F  . GERD (gastroesophageal reflux disease)   . Hx of pancreatitis 2015  . Myocardial infarction   . Pneumonia   . Mitral regurgitation     a. echo 10/2013: EF 62%, noWMA, mildly dilated LA, mild to mod MR/TR, GR1DD  . COPD (chronic obstructive pulmonary disease)   . chronic diastolic CHF [id number]  . dialysis 2006     ADMITTING HISTORY  [name] is a 54 y.o. female with a known history of end-stage renal disease on hemodialysis, coronary artery disease, diastolic heart failure and COPD who was recently discharged from the hospital with pneumonia and presents today with 3-4 days of nausea vomiting. Patient says she is unable taken any by mouth's and continues to have nausea and vomiting. In the emergency room she has received when necessary Phenergan as well as morphine and gentle hydration.    HOSPITAL COURSE:   54 year old female with past medical history of end-stage renal disease on M-W-F hemodialysis, hypertension, diastolic CHF, secondary hyperparathyroidism, chronic pancreatitis, diabetic neuropathy,  diabetes, diabetic gastroparesis who presents to the hospital due to vomiting  * Diabetic gastroparesis Change Reglan to IV scheduled and symptoms improved well. Seen  By GI and recommended symptomatic management.  On the day of discharge patient has no further vomiting. On that she does have nausea which is chronic. Tolerated soft diet.  Discussed care with patient and family over phone.  Stable for discharge home.  * End-stage renal disease on Monday, Wednesday, Friday hemodialysis -Consult nephrology. - Missed HD  On day of admission and had dialysis in the hospital. Continue outpatient dialysis schedule as before.  * generalized weakness  * chronic pancreatitis-has chronic nausea and diarrhea. Continue her pancreatic supplements.  * hypertension-continue Coreg, Norvasc  * hyperlipidemia-continue atorvastatin.  * IDDM Episodes of hypoglycemia due to decreased oral intake. Mild into 50s and asymptomatic. Monitor Off long acting insulin Referred to endocrinology as OP at discharge.  CONSULTS OBTAINED:  Treatment Team:  Murlean Iba, MD Josefine Class, MD  DRUG ALLERGIES:   Allergies  Allergen Reactions  . Ace Inhibitors Swelling  . Ativan [Lorazepam] Other (See Comments)    Reaction:  Hallucinations and headaches  . Codeine Nausea And Vomiting  . Compazine [Prochlorperazine Edisylate] Nausea And Vomiting  . Gabapentin Other (See Comments)    Reaction:  Unknown   . Losartan Other (See Comments)    Reaction:  Unknown   . Scopolamine Other (See Comments)    Reaction:  Unknown   .  Zofran [Ondansetron Hcl] Other (See Comments)    Reaction:  hallucinations   . Oxycodone Anxiety  . Tape Rash    DISCHARGE MEDICATIONS:   Discharge Medication List as of 06/07/2015 12:29 PM    START taking these medications   Details  docusate sodium (COLACE) 100 MG capsule Take 1 capsule (100 mg total) by mouth 2 (two) times daily., Starting 06/07/2015, Until Discontinued, Normal     promethazine (PHENERGAN) 25 MG suppository Place 1 suppository (25 mg total) rectally every 8 (eight) hours as needed for nausea or vomiting., Starting 06/07/2015, Until Discontinued, Normal      CONTINUE these medications which have NOT CHANGED   Details  albuterol (PROVENTIL HFA;VENTOLIN HFA) 108 (90 BASE) MCG/ACT inhaler Inhale 4-6 puffs by mouth every 4 hours as needed for wheezing, cough, and/or shortness of breath, Print    albuterol (PROVENTIL) (2.5 MG/3ML) 0.083% nebulizer solution Take 2.5 mg by nebulization every 4 (four) hours as needed for wheezing or shortness of breath., Until Discontinued, Historical Med    amLODipine (NORVASC) 10 MG tablet Take 10 mg by mouth daily., Until Discontinued, Historical Med    aspirin EC 81 MG tablet Take 81 mg by mouth daily., Until Discontinued, Historical Med    atorvastatin (LIPITOR) 20 MG tablet Take 20 mg by mouth every evening., Until Discontinued, Historical Med    B Complex-C-Biotin-E-FA (RENATABS PO) Take 1 tablet by mouth daily., Until Discontinued, Historical Med    carvedilol (COREG) 25 MG tablet Take 25 mg by mouth 2 (two) times daily., Until Discontinued, Historical Med    cetirizine (ZYRTEC) 10 MG tablet Take 10 mg by mouth daily., Until Discontinued, Historical Med    citalopram (CELEXA) 20 MG tablet Take 20 mg by mouth daily., Until Discontinued, Historical Med    clopidogrel (PLAVIX) 75 MG tablet Take 75 mg by mouth daily., Until Discontinued, Historical Med    guaiFENesin-dextromethorphan (ROBITUSSIN DM) 100-10 MG/5ML syrup Take 10 mLs by mouth every 6 (six) hours as needed for cough., Starting 05/24/2015, Until Discontinued, Print    HYDROcodone-acetaminophen (NORCO/VICODIN) 5-325 MG per tablet Take 1 tablet by mouth 3 (three) times daily as needed for moderate pain. , Until Discontinued, Historical Med    levofloxacin (LEVAQUIN) 750 MG tablet Take 750 mg by mouth every Monday, Wednesday, and Friday., Until Discontinued,  Historical Med    lipase/protease/amylase (CREON) 12000 UNITS CPEP capsule Take 36,000 Units by mouth 3 (three) times daily with meals. , Until Discontinued, Historical Med    metoCLOPramide (REGLAN) 5 MG tablet Take 1 tablet (5 mg total) by mouth 4 (four) times daily -  before meals and at bedtime., Starting 02/24/2015, Until Discontinued, Print    nitroGLYCERIN (NITROSTAT) 0.4 MG SL tablet Place 0.4 mg under the tongue every 5 (five) minutes x 3 doses as needed for chest pain. , Until Discontinued, Historical Med    pantoprazole (PROTONIX) 40 MG tablet Take 40 mg by mouth daily., Until Discontinued, Historical Med    promethazine (PHENERGAN) 12.5 MG tablet Take 12.5 mg by mouth every 6 (six) hours as needed for nausea or vomiting., Until Discontinued, Historical Med    ranitidine (ZANTAC) 150 MG tablet Take 150 mg by mouth daily. , Until Discontinued, Historical Med    ranolazine (RANEXA) 1000 MG SR tablet Take 1,000 mg by mouth 2 (two) times daily., Until Discontinued, Historical Med    sevelamer carbonate (RENVELA) 800 MG tablet Take 800 mg by mouth 3 (three) times daily with meals. , Until Discontinued, Historical Med  sucralfate (CARAFATE) 1 G tablet Take 1 tablet (1 g total) by mouth 4 (four) times daily -  with meals and at bedtime., Starting 02/24/2015, Until Discontinued, Print    Vitamin D, Cholecalciferol, 400 UNITS TABS Take 400 Units by mouth daily. , Until Discontinued, Historical Med      STOP taking these medications     insulin aspart (NOVOLOG) 100 UNIT/ML injection          Today    VITAL SIGNS:  Blood pressure 142/49, pulse 65, temperature 97.8 F (36.6 C), temperature source Oral, resp. rate 16, height 5\' 5"  (1.651 m), weight 81.4 kg (179 lb 7.3 oz), SpO2 100 %.  I/O:  No intake or output data in the 24 hours ending 06/08/15 1304  PHYSICAL EXAMINATION:  Physical Exam  GENERAL:  54 y.o.-year-old patient lying in the bed with no acute distress.  LUNGS:  Normal breath sounds bilaterally, no wheezing, rales,rhonchi or crepitation. No use of accessory muscles of respiration.  CARDIOVASCULAR: S1, S2 normal. No murmurs, rubs, or gallops.  ABDOMEN: Soft, non-tender, non-distended. Bowel sounds present. No organomegaly or mass.  NEUROLOGIC: Moves all 4 extremities. PSYCHIATRIC: The patient is alert and oriented x 3.  SKIN: No obvious rash, lesion, or ulcer.   DATA REVIEW:   CBC  Recent Labs Lab 06/06/15 0806  WBC 6.5  HGB 8.9*  HCT 27.1*  PLT 120*    Chemistries   Recent Labs Lab 06/05/15 1600  06/07/15 0715  NA 140  < > 140  K 2.9*  < > 4.3  CL 98*  < > 105  CO2 32  < > 31  GLUCOSE 76  < > 96  BUN 14  < > 7  CREATININE 6.08*  < > 3.75*  CALCIUM 9.1  < > 8.0*  MG  --   --  1.9  AST 25  --   --   ALT 19  --   --   ALKPHOS 146*  --   --   BILITOT 0.7  --   --   < > = values in this interval not displayed.  Cardiac Enzymes No results for input(s): TROPONINI in the last 168 hours.  Microbiology Results  Results for orders placed or performed during the hospital encounter of 05/21/15  Blood culture (routine x 2)     Status: None   Collection Time: 05/21/15  8:50 PM  Result Value Ref Range Status   Specimen Description BLOOD RIGHT ARM  Final   Special Requests BOTTLES DRAWN AEROBIC AND ANAEROBIC Ryan  Final   Culture NO GROWTH 5 DAYS  Final   Report Status 05/26/2015 FINAL  Final  Blood culture (routine x 2)     Status: None   Collection Time: 05/21/15  8:52 PM  Result Value Ref Range Status   Specimen Description BLOOD RIGHT ARM  Final   Special Requests BOTTLES DRAWN AEROBIC AND ANAEROBIC [place]  Final   Culture NO GROWTH 5 DAYS  Final   Report Status 05/26/2015 FINAL  Final    RADIOLOGY:  No results found.    Follow up with PCP in 1 week.  Management plans discussed with the patient, family and they are in agreement.  CODE STATUS:   TOTAL TIME TAKING CARE OF THIS PATIENT ON DAY OF DISCHARGE: more than 30  minutes.    [name] R M.D on 06/08/2015 at 1:04 PM  Between 7am to 6pm - Pager - [phone]  After 6pm go to [email or web address] -  password [email or web address] [facility]  Office  [phone]  CC: Primary care physician; Ellamae Sia, MD

## 2015-06-14 ENCOUNTER — Emergency Department
Admission: EM | Admit: 2015-06-14 | Discharge: 2015-06-14 | Disposition: A | Discharge status: Home / Self Care | Payer: Medicare Other | Attending: Emergency Medicine | Admitting: Emergency Medicine

## 2015-06-14 ENCOUNTER — Encounter: Payer: Self-pay | Admitting: Intensive Care

## 2015-06-14 ENCOUNTER — Emergency Department: Payer: Medicare Other

## 2015-06-14 DIAGNOSIS — Z79899 Other long term (current) drug therapy: Secondary | ICD-10-CM | POA: Insufficient documentation

## 2015-06-14 DIAGNOSIS — Z7902 Long term (current) use of antithrombotics/antiplatelets: Secondary | ICD-10-CM | POA: Diagnosis not present

## 2015-06-14 DIAGNOSIS — Z992 Dependence on renal dialysis: Secondary | ICD-10-CM | POA: Diagnosis not present

## 2015-06-14 DIAGNOSIS — E1140 Type 2 diabetes mellitus with diabetic neuropathy, unspecified: Secondary | ICD-10-CM | POA: Diagnosis not present

## 2015-06-14 DIAGNOSIS — Z720 Tobacco use: Secondary | ICD-10-CM | POA: Insufficient documentation

## 2015-06-14 DIAGNOSIS — R197 Diarrhea, unspecified: Secondary | ICD-10-CM | POA: Insufficient documentation

## 2015-06-14 DIAGNOSIS — I120 Hypertensive chronic kidney disease with stage 5 chronic kidney disease or end stage renal disease: Secondary | ICD-10-CM | POA: Insufficient documentation

## 2015-06-14 DIAGNOSIS — R112 Nausea with vomiting, unspecified: Secondary | ICD-10-CM | POA: Insufficient documentation

## 2015-06-14 DIAGNOSIS — G8929 Other chronic pain: Secondary | ICD-10-CM

## 2015-06-14 DIAGNOSIS — N186 End stage renal disease: Secondary | ICD-10-CM | POA: Diagnosis not present

## 2015-06-14 DIAGNOSIS — R109 Unspecified abdominal pain: Secondary | ICD-10-CM | POA: Insufficient documentation

## 2015-06-14 DIAGNOSIS — R1115 Cyclical vomiting syndrome unrelated to migraine: Secondary | ICD-10-CM

## 2015-06-14 DIAGNOSIS — Z7982 Long term (current) use of aspirin: Secondary | ICD-10-CM | POA: Diagnosis not present

## 2015-06-14 LAB — CBC WITH DIFFERENTIAL/PLATELET
Basophils Absolute: 0 10*3/uL (ref 0–0.1)
Basophils Relative: 1 %
Eosinophils Absolute: 0.2 10*3/uL (ref 0–0.7)
Eosinophils Relative: 5 %
HCT: 28.3 % — ABNORMAL LOW (ref 35.0–47.0)
Hemoglobin: 9.3 g/dL — ABNORMAL LOW (ref 12.0–16.0)
Lymphocytes Relative: 38 %
Lymphs Abs: 2 10*3/uL (ref 1.0–3.6)
MCH: 27.8 pg (ref 26.0–34.0)
MCHC: 32.7 g/dL (ref 32.0–36.0)
MCV: 85.1 fL (ref 80.0–100.0)
Monocytes Absolute: 0.5 10*3/uL (ref 0.2–0.9)
Monocytes Relative: 9 %
Neutro Abs: 2.5 10*3/uL (ref 1.4–6.5)
Neutrophils Relative %: 47 %
Platelets: 235 10*3/uL (ref 150–440)
RBC: 3.33 MIL/uL — ABNORMAL LOW (ref 3.80–5.20)
RDW: 19.5 % — ABNORMAL HIGH (ref 11.5–14.5)
WBC: 5.3 10*3/uL (ref 3.6–11.0)

## 2015-06-14 LAB — COMPREHENSIVE METABOLIC PANEL
ALT: 14 U/L (ref 14–54)
AST: 20 U/L (ref 15–41)
Albumin: 1.7 g/dL — ABNORMAL LOW (ref 3.5–5.0)
Alkaline Phosphatase: 131 U/L — ABNORMAL HIGH (ref 38–126)
Anion gap: 8 (ref 5–15)
BUN: 10 mg/dL (ref 6–20)
CO2: 33 mmol/L — ABNORMAL HIGH (ref 22–32)
Calcium: 8 mg/dL — ABNORMAL LOW (ref 8.9–10.3)
Chloride: 97 mmol/L — ABNORMAL LOW (ref 101–111)
Creatinine, Ser: 2.81 mg/dL — ABNORMAL HIGH (ref 0.44–1.00)
GFR calc Af Amer: 21 mL/min — ABNORMAL LOW (ref >60)
GFR calc non Af Amer: 18 mL/min — ABNORMAL LOW (ref >60)
Glucose, Bld: 66 mg/dL (ref 65–99)
Potassium: 3.3 mmol/L — ABNORMAL LOW (ref 3.5–5.1)
Sodium: 138 mmol/L (ref 135–145)
Total Bilirubin: 0.5 mg/dL (ref 0.3–1.2)
Total Protein: 5.7 g/dL — ABNORMAL LOW (ref 6.5–8.1)

## 2015-06-14 LAB — LIPASE, BLOOD: Lipase: 11 U/L — ABNORMAL LOW (ref 22–51)

## 2015-06-14 MED ORDER — PROMETHAZINE HCL 25 MG/ML IJ SOLN
25.0000 mg | Freq: Once | INTRAMUSCULAR | Status: AC
  Administered 2015-06-14: 25 mg via INTRAVENOUS
  Filled 2015-06-14: qty 1

## 2015-06-14 MED ORDER — PROCHLORPERAZINE MALEATE 5 MG PO TABS
5.0000 mg | ORAL_TABLET | Freq: Four times a day (QID) | ORAL | Status: DC | PRN
Start: 2015-06-14 — End: 2015-10-03

## 2015-06-14 MED ORDER — ONDANSETRON 4 MG PO TBDP
4.0000 mg | ORAL_TABLET | Freq: Three times a day (TID) | ORAL | Status: DC | PRN
Start: 2015-06-14 — End: 2015-10-03

## 2015-06-14 MED ORDER — HYDROMORPHONE HCL 1 MG/ML IJ SOLN
1.0000 mg | Freq: Once | INTRAMUSCULAR | Status: AC
  Administered 2015-06-14: 1 mg via INTRAVENOUS
  Filled 2015-06-14: qty 1

## 2015-06-14 MED ORDER — LORAZEPAM 2 MG/ML IJ SOLN
1.0000 mg | Freq: Once | INTRAMUSCULAR | Status: AC
  Administered 2015-06-14: 1 mg via INTRAVENOUS
  Filled 2015-06-14: qty 1

## 2015-06-14 NOTE — Discharge Instructions (Signed)
Abdominal Pain Many things can cause abdominal pain. Usually, abdominal pain is not caused by a disease and will improve without treatment. It can often be observed and treated at home. Your health care provider will do a physical exam and possibly order blood tests and X-rays to help determine the seriousness of your pain. However, in many cases, more time must pass before a clear cause of the pain can be found. Before that point, your health care provider may not know if you need more testing or further treatment. HOME CARE INSTRUCTIONS  Monitor your abdominal pain for any changes. The following actions may help to alleviate any discomfort you are experiencing:  Only take over-the-counter or prescription medicines as directed by your health care provider.  Do not take laxatives unless directed to do so by your health care provider.  Try a clear liquid diet (broth, tea, or water) as directed by your health care provider. Slowly move to a bland diet as tolerated. SEEK MEDICAL CARE IF: 1. You have unexplained abdominal pain. 2. You have abdominal pain associated with nausea or diarrhea. 3. You have pain when you urinate or have a bowel movement. 4. You experience abdominal pain that wakes you in the night. 5. You have abdominal pain that is worsened or improved by eating food. 6. You have abdominal pain that is worsened with eating fatty foods. 7. You have a fever. SEEK IMMEDIATE MEDICAL CARE IF:   Your pain does not go away within 2 hours.  You keep throwing up (vomiting).  Your pain is felt only in portions of the abdomen, such as the right side or the left lower portion of the abdomen.  You pass bloody or black tarry stools. MAKE SURE YOU:  Understand these instructions.   Will watch your condition.   Will get help right away if you are not doing well or get worse.  Document Released: 06/18/2005 Document Revised: 09/13/2013 Document Reviewed: 05/18/2013 [facility] Patient  Information 2015 [place], [place]. This information is not intended to replace advice given to you by your health care provider. Make sure you discuss any questions you have with your health care provider.  Cyclic Vomiting Syndrome Cyclic vomiting syndrome is a benign condition in which patients experience bouts or cycles of severe nausea and vomiting that last for hours or even days. The bouts of nausea and vomiting alternate with longer periods of no symptoms and generally good health. Cyclic vomiting syndrome occurs mostly in children, but can affect adults. CAUSES  [facility] has no known cause. Each episode is typically similar to the previous ones. The episodes tend to:   Start at about the same time of day.  Last the same length of time.  Present the same symptoms at the same level of intensity. Cyclic vomiting syndrome can begin at any age in children and adults. Cyclic vomiting syndrome usually starts between the ages of 3 and 7 years. In adults, episodes tend to occur less often than they do in children, but they last longer. Furthermore, the events or situations that trigger episodes in adults cannot always be pinpointed as easily as they can in children. There are 4 phases of cyclic vomiting syndrome: 8. Prodrome. The prodrome phase signals that an episode of nausea and vomiting is about to begin. This phase can last from just a few minutes to several hours. This phase is often marked by belly (abdominal) pain. Sometimes taking medicine early in the prodrome phase can stop an episode in progress. However,  sometimes there is no warning. A person may simply wake up in the middle of the night or early morning and begin vomiting. 9. Episode. The episode phase consists of:  Severe vomiting.  Nausea.  Gagging (retching). 10. Recovery. The recovery phase begins when the nausea and vomiting stop. Healthy color, appetite, and energy return. 11. Symptom-free interval. The symptom-free interval phase  is the period between episodes when no symptoms are present. TRIGGERS Episodes can be triggered by an infection or event. Examples of triggers include:  Infections.  Colds, allergies, sinus problems, and the flu.  Eating certain foods such as chocolate or cheese.  Foods with monosodium glutamate (MSG) or preservatives.  Fast foods.  Pre-packaged foods.  Foods with low nutritional value (junk foods).  Overeating.  Eating just before going to bed.  Hot weather.  Dehydration.  Not enough sleep or poor sleep quality.  Physical exhaustion.  Menstruation.  Motion sickness.  Emotional stress (school or home difficulties).  Excitement or stress. SYMPTOMS  The main symptoms of cyclic vomiting syndrome are:  Severe vomiting.  Nausea.  Gagging (retching). Episodes usually begin at night or the first thing in the morning. Episodes may include vomiting or retching up to 5 or 6 times an hour during the worst of the episode. Episodes usually last anywhere from 1 to 4 days. Episodes can last for up to 10 days. Other symptoms include:  Paleness.  Exhaustion.  Listlessness.  Abdominal pain.  Loose stools or diarrhea. Sometimes the nausea and vomiting are so severe that a person appears to be almost unconscious. Sensitivity to light, headache, fever, dizziness, may also accompany an episode. In addition, the vomiting may cause drooling and excessive thirst. Drinking water usually leads to more vomiting, though the water can dilute the acid in the vomit, making the episode a little less painful. Continuous vomiting can lead to dehydration, which means that the body has lost excessive water and salts. DIAGNOSIS  Cyclic vomiting syndrome is hard to diagnose because there are no clear tests to identify it. A caregiver must diagnose cyclic vomiting syndrome by looking at symptoms and medical history. A caregiver must exclude more common diseases or disorders that can also cause  nausea and vomiting. Also, diagnosis takes time because caregivers need to identify a pattern or cycle to the vomiting. TREATMENT  Cyclic vomiting syndrome cannot be cured. Treatment varies, but people with cyclic vomiting syndrome should get plenty of rest and sleep and take medications that prevent, stop, or lessen the vomiting episodes and other symptoms. People whose episodes are frequent and long-lasting may be treated during the symptom-free intervals in an effort to prevent or ease future episodes. The symptom-free phase is a good time to eliminate anything known to trigger an episode. For example, if episodes are brought on by stress or excitement, this period is the time to find ways to reduce stress and stay calm. If sinus problems or allergies cause episodes, those conditions should be treated. The triggers listed above should be avoided or prevented. Because of the similarities between migraine and cyclic vomiting syndrome, caregivers treat some people with severe cyclic vomiting syndrome with drugs that are also used for migraine headaches. The drugs are designed to:  Prevent episodes.  Reduce their frequency.  Lessen their severity. HOME CARE INSTRUCTIONS Once a vomiting episode begins, treatment is supportive. It helps to stay in bed and sleep in a dark, quiet room. Severe nausea and vomiting may require hospitalization and intravenous (IV) fluids to prevent dehydration.  Relaxing medications (sedatives) may help if the nausea continues. Sometimes, during the prodrome phase, it is possible to stop an episode from happening altogether. Only take over-the-counter or prescription medicines for pain, discomfort or fever as directed by your caregiver. Do not give aspirin to children. During the recovery phase, drinking water and replacing lost electrolytes (salts in the blood) are very important. Electrolytes are salts that the body needs to function well and stay healthy. Symptoms during the  recovery phase can vary. Some people find that their appetites return to normal immediately, while others need to begin by drinking clear liquids and then move slowly to solid food. RELATED COMPLICATIONS The severe vomiting that defines cyclic vomiting syndrome is a risk factor for several complications:  Dehydration--Vomiting causes the body to lose water quickly.  Electrolyte imbalance--Vomiting also causes the body to lose the important salts it needs to keep working properly.  Peptic esophagitis--The tube that connects the mouth to the stomach (esophagus) becomes injured from the stomach acid that comes up with the vomit.  Hematemesis--The esophagus becomes irritated and bleeds, so blood mixes with the vomit.  Mallory-Weiss tear--The lower end of the esophagus may tear open or the stomach may bruise from vomiting or retching.  Tooth decay--The acid in the vomit can hurt the teeth by corroding the tooth enamel. SEEK MEDICAL CARE IF: You have questions or problems. Document Released: 11/17/2001 Document Revised: 12/01/2011 Document Reviewed: 12/16/2010 [facility] Patient Information 2015 [place], [place]. This information is not intended to replace advice given to you by your health care provider. Make sure you discuss any questions you have with your health care provider.

## 2015-06-14 NOTE — ED Notes (Signed)
Patient arrived from home by ems for symtpoms of pancreatitis. PAtient was just seen a week ago for the same thing. Pt c/o abdominal pain and N/V

## 2015-06-14 NOTE — ED Provider Notes (Signed)
[facility] Emergency Department Provider Note     Time seen: ----------------------------------------- 1:15 PM on 06/14/2015 -----------------------------------------    I have reviewed the triage vital signs and the nursing notes.   HISTORY  Chief Complaint No chief complaint on file.    HPI [name] is a 54 y.o. female well-known to the ER who presents for abdominal pain, nausea and vomiting. Patient has had a little bit of diarrhea she states. This is a chronic problem for her, with a history of gastroparesis and chronic pancreatitis. She is a Monday Wednesday Friday dialysis patient, states dialysis were normal yesterday. Pain and vomiting started again today.   Past Medical History  Diagnosis Date  . Asthma   . Collagen vascular disease   . Diabetes mellitus without complication   . Hypertension   . Coronary artery disease     a. cath 2013: stenting to RCA (report not available); b. cath 2014: LM nl, pLAD 40%, mLAD nl, ost LCx 40%, mid LCx nl, pRCA 30% @ site of prior stent, mRCA 50%  . Diabetic neuropathy   . ESRD (end stage renal disease) on dialysis     M-W-F  . GERD (gastroesophageal reflux disease)   . Hx of pancreatitis 2015  . Myocardial infarction   . Pneumonia   . Mitral regurgitation     a. echo 10/2013: EF 62%, noWMA, mildly dilated LA, mild to mod MR/TR, GR1DD  . COPD (chronic obstructive pulmonary disease)   . chronic diastolic CHF [id number]  . dialysis 2006    Patient Active Problem List   Diagnosis Date Noted  . Pneumonia 05/21/2015  . Hypoglycemia 04/24/2015  . Unresponsiveness 04/24/2015  . Bradycardia 04/24/2015  . Hypothermia 04/24/2015  . Acute respiratory failure 04/24/2015  . Acute diastolic CHF (congestive heart failure) 04/05/2015  . Diabetic gastroparesis 04/05/2015  . Hypokalemia 04/05/2015  . Generalized weakness 04/05/2015  . Acute pulmonary edema 04/03/2015  . Nausea and vomiting 04/03/2015  .  Hypoglycemia associated with diabetes 04/03/2015  . Anemia of chronic disease 04/03/2015  . Secondary hyperparathyroidism 04/03/2015  . Pressure ulcer 04/02/2015  . Acute respiratory failure with hypoxia 04/01/2015  . Adjustment disorder with anxiety 03/14/2015  . Somatic symptom disorder, mild 03/08/2015  . Coronary artery disease involving native coronary artery of native heart without angina pectoris   . Nausea & vomiting 03/06/2015  . Abdominal pain 03/06/2015  . DM (diabetes mellitus) 03/06/2015  . HTN (hypertension) 03/06/2015  . Gastroparesis 02/24/2015  . Pleural effusion 02/19/2015  . HCAP (healthcare-associated pneumonia) 02/19/2015  . End-stage renal disease on hemodialysis 02/19/2015    Past Surgical History  Procedure Laterality Date  . Cholecystectomy    . Appendectomy    . Abdominal hysterectomy    . Dialysis fistula creation Left     upper arm  . Esophagogastroduodenoscopy N/A 03/08/2015    Procedure: ESOPHAGOGASTRODUODENOSCOPY (EGD);  Surgeon: Manya Silvas, MD;  Location: [facility] ENDOSCOPY;  Service: Endoscopy;  Laterality: N/A;    Allergies Ace inhibitors; Ativan; Codeine; Compazine; Gabapentin; Losartan; Scopolamine; Zofran; Oxycodone; and Tape  Social History Social History  Substance Use Topics  . Smoking status: Current Some Day Smoker -- 0.50 packs/day    Types: Cigarettes    Last Attempt to Quit: 02/13/2015  . Smokeless tobacco: Never Used  . Alcohol Use: No    Review of Systems Constitutional: Negative for fever. Eyes: Negative for visual changes. ENT: Negative for sore throat. Cardiovascular: Negative for chest pain. Respiratory: Negative for shortness  of breath. Gastrointestinal: Positive for abdominal pain and vomiting Genitourinary: Negative for dysuria. Musculoskeletal: Negative for back pain. Skin: Negative for rash. Neurological: Negative for headaches, focal weakness or numbness.  10-point ROS otherwise  negative.  ____________________________________________   PHYSICAL EXAM:  VITAL SIGNS: ED Triage Vitals  Enc Vitals Group     BP --      Pulse --      Resp --      Temp --      Temp src --      SpO2 --      Weight --      Height --      Head Cir --      Peak Flow --      Pain Score --      Pain Loc --      Pain Edu? --      Excl. in [place]? --     Constitutional: Alert and oriented. Mild distress Eyes: Conjunctivae are normal. PERRL. Normal extraocular movements. ENT   Head: Normocephalic and atraumatic.   Nose: No congestion/rhinnorhea.   Mouth/Throat: Mucous membranes are moist.   Neck: No stridor. Cardiovascular: Normal rate, regular rhythm. Normal and symmetric distal pulses are present in all extremities. No murmurs, rubs, or gallops. Respiratory: Normal respiratory effort without tachypnea nor retractions. Breath sounds are clear and equal bilaterally. No wheezes/rales/rhonchi. Gastrointestinal: Abdominal tenderness, no rebound or guarding. Normal bowel sounds. Musculoskeletal: Nontender with normal range of motion in all extremities. No joint effusions.  No lower extremity tenderness nor edema. Neurologic:  Normal speech and language. No gross focal neurologic deficits are appreciated. Speech is normal. No gait instability. Skin:  Skin is warm, dry and intact. No rash noted. Psychiatric: Mood and affect are normal. Speech and behavior are normal. Patient exhibits appropriate insight and judgment.  ____________________________________________  EKG: Interpreted by me, normal sinus rhythm with a rate of 72 bpm, low voltage QRS, normal axis. No evidence of acute infarction.  ED COURSE:  Pertinent labs & imaging results that were available during my care of the patient were reviewed by me and considered in my medical decision making (see chart for details). Patient is chronically ill, well-known to me. She's been given IV Ativan, Phenergan and Dilaudid. We'll  reassess after that. ____________________________________________ Imaging:  FINDINGS: Normal bowel gas pattern without free peritoneal air. Cholecystectomy clips. Unremarkable bones.  IMPRESSION: No acute abnormality.   LABS (pertinent positives/negatives)  Labs Reviewed  CBC WITH DIFFERENTIAL/PLATELET - Abnormal; Notable for the following:    RBC 3.33 (*)    Hemoglobin 9.3 (*)    HCT 28.3 (*)    RDW 19.5 (*)    All other components within normal limits  COMPREHENSIVE METABOLIC PANEL - Abnormal; Notable for the following:    Potassium 3.3 (*)    Chloride 97 (*)    CO2 33 (*)    Creatinine, Ser 2.81 (*)    Calcium 8.0 (*)    Total Protein 5.7 (*)    Albumin 1.7 (*)    Alkaline Phosphatase 131 (*)    GFR calc non Af Amer 18 (*)    GFR calc Af Amer 21 (*)    All other components within normal limits  LIPASE, BLOOD - Abnormal; Notable for the following:    Lipase 11 (*)    All other components within normal limits   ____________________________________________  FINAL ASSESSMENT AND PLAN  Chronic abdominal pain, chronic vomiting  Plan: Patient with labs and imaging as dictated  above. Patient is in no acute distress, feeling better after the above medications were given. Her labs are stable, chronic lab abnormalities are noted. I'm unsure of his gastroparesis or simply chronic pain. This point she is stable for outpatient follow-up   Earleen Newport, MD   Earleen Newport, MD 06/14/15 1430

## 2015-06-15 ENCOUNTER — Encounter: Payer: Self-pay | Admitting: Emergency Medicine

## 2015-06-15 ENCOUNTER — Emergency Department
Admission: EM | Admit: 2015-06-15 | Discharge: 2015-06-15 | Disposition: A | Discharge status: Home / Self Care | Payer: Medicare Other | Source: Home / Self Care | Attending: Emergency Medicine | Admitting: Emergency Medicine

## 2015-06-15 ENCOUNTER — Encounter: Payer: Self-pay | Admitting: *Deleted

## 2015-06-15 DIAGNOSIS — R109 Unspecified abdominal pain: Secondary | ICD-10-CM | POA: Diagnosis not present

## 2015-06-15 DIAGNOSIS — R1084 Generalized abdominal pain: Secondary | ICD-10-CM

## 2015-06-15 DIAGNOSIS — R110 Nausea: Secondary | ICD-10-CM

## 2015-06-15 LAB — COMPREHENSIVE METABOLIC PANEL
ALT: 13 U/L — ABNORMAL LOW (ref 14–54)
AST: 21 U/L (ref 15–41)
Albumin: 1.7 g/dL — ABNORMAL LOW (ref 3.5–5.0)
Alkaline Phosphatase: 134 U/L — ABNORMAL HIGH (ref 38–126)
Anion gap: 7 (ref 5–15)
BUN: 11 mg/dL (ref 6–20)
CO2: 34 mmol/L — ABNORMAL HIGH (ref 22–32)
Calcium: 8.3 mg/dL — ABNORMAL LOW (ref 8.9–10.3)
Chloride: 98 mmol/L — ABNORMAL LOW (ref 101–111)
Creatinine, Ser: 3.53 mg/dL — ABNORMAL HIGH (ref 0.44–1.00)
GFR calc Af Amer: 16 mL/min — ABNORMAL LOW (ref >60)
GFR calc non Af Amer: 13 mL/min — ABNORMAL LOW (ref >60)
Glucose, Bld: 89 mg/dL (ref 65–99)
Potassium: 3.3 mmol/L — ABNORMAL LOW (ref 3.5–5.1)
Sodium: 139 mmol/L (ref 135–145)
Total Bilirubin: 0.6 mg/dL (ref 0.3–1.2)
Total Protein: 5.8 g/dL — ABNORMAL LOW (ref 6.5–8.1)

## 2015-06-15 LAB — CBC WITH DIFFERENTIAL/PLATELET
Basophils Absolute: 0 10*3/uL (ref 0–0.1)
Basophils Relative: 1 %
Eosinophils Absolute: 0.2 10*3/uL (ref 0–0.7)
Eosinophils Relative: 5 %
HCT: 28 % — ABNORMAL LOW (ref 35.0–47.0)
Hemoglobin: 9.1 g/dL — ABNORMAL LOW (ref 12.0–16.0)
Lymphocytes Relative: 37 %
Lymphs Abs: 1.7 10*3/uL (ref 1.0–3.6)
MCH: 27.9 pg (ref 26.0–34.0)
MCHC: 32.5 g/dL (ref 32.0–36.0)
MCV: 85.8 fL (ref 80.0–100.0)
Monocytes Absolute: 0.4 10*3/uL (ref 0.2–0.9)
Monocytes Relative: 9 %
Neutro Abs: 2.3 10*3/uL (ref 1.4–6.5)
Neutrophils Relative %: 48 %
Platelets: 246 10*3/uL (ref 150–440)
RBC: 3.26 MIL/uL — ABNORMAL LOW (ref 3.80–5.20)
RDW: 19.6 % — ABNORMAL HIGH (ref 11.5–14.5)
WBC: 4.7 10*3/uL (ref 3.6–11.0)

## 2015-06-15 LAB — LIPASE, BLOOD: Lipase: 12 U/L — ABNORMAL LOW (ref 22–51)

## 2015-06-15 MED ORDER — HYDROMORPHONE HCL 1 MG/ML IJ SOLN
1.0000 mg | Freq: Once | INTRAMUSCULAR | Status: AC
  Administered 2015-06-15: 1 mg via INTRAVENOUS
  Filled 2015-06-15: qty 1

## 2015-06-15 MED ORDER — PROMETHAZINE HCL 25 MG/ML IJ SOLN
25.0000 mg | Freq: Once | INTRAMUSCULAR | Status: AC
  Administered 2015-06-15: 25 mg via INTRAVENOUS
  Filled 2015-06-15: qty 1

## 2015-06-15 NOTE — ED Notes (Signed)
Pt arrived from home via EMS with abdominal pain.  Pt seen here yesterday and diagnosed with pancreatitis.  Continues to have nausea and vomiting.  Pt states she cannot take Zofran because it causes hallucinations. CBG with EMS was 59 and was given 12.5 of D50. She was also given a total of 100 mcg of Fentanyl for her pain by EMS. Dialysis patient-normal days are MWF. Did not go today.

## 2015-06-15 NOTE — Discharge Instructions (Signed)
Please follow up with her primary care physician as scheduled at 1 PM this Thursday. Please follow-up with dialysis tomorrow for your arrange dialysis. Return to the emergency department for any worsening abdominal pain, fever, or any other symptom personally concerning to self.     Abdominal Pain, Women Abdominal (stomach, pelvic, or belly) pain can be caused by many things. It is important to tell your doctor:  The location of the pain.  Does it come and go or is it present all the time?  Are there things that start the pain (eating certain foods, exercise)?  Are there other symptoms associated with the pain (fever, nausea, vomiting, diarrhea)? All of this is helpful to know when trying to find the cause of the pain. CAUSES   Stomach: virus or bacteria infection, or ulcer.  Intestine: appendicitis (inflamed appendix), regional ileitis (Crohn's disease), ulcerative colitis (inflamed colon), irritable bowel syndrome, diverticulitis (inflamed diverticulum of the colon), or cancer of the stomach or intestine.  Gallbladder disease or stones in the gallbladder.  Kidney disease, kidney stones, or infection.  Pancreas infection or cancer.  Fibromyalgia (pain disorder).  Diseases of the female organs:  Uterus: fibroid (non-cancerous) tumors or infection.  Fallopian tubes: infection or tubal pregnancy.  Ovary: cysts or tumors.  Pelvic adhesions (scar tissue).  Endometriosis (uterus lining tissue growing in the pelvis and on the pelvic organs).  Pelvic congestion syndrome (female organs filling up with blood just before the menstrual period).  Pain with the menstrual period.  Pain with ovulation (producing an egg).  Pain with an IUD (intrauterine device, birth control) in the uterus.  Cancer of the female organs.  Functional pain (pain not caused by a disease, may improve without treatment).  Psychological pain.  Depression. DIAGNOSIS  Your doctor will decide the  seriousness of your pain by doing an examination.  Blood tests.  X-rays.  Ultrasound.  CT scan (computed tomography, special type of X-ray).  MRI (magnetic resonance imaging).  Cultures, for infection.  Barium enema (dye inserted in the large intestine, to better view it with X-rays).  Colonoscopy (looking in intestine with a lighted tube).  Laparoscopy (minor surgery, looking in abdomen with a lighted tube).  Major abdominal exploratory surgery (looking in abdomen with a large incision). TREATMENT  The treatment will depend on the cause of the pain.   Many cases can be observed and treated at home.  Over-the-counter medicines recommended by your caregiver.  Prescription medicine.  Antibiotics, for infection.  Birth control pills, for painful periods or for ovulation pain.  Hormone treatment, for endometriosis.  Nerve blocking injections.  Physical therapy.  Antidepressants.  Counseling with a psychologist or psychiatrist.  Minor or major surgery. HOME CARE INSTRUCTIONS   Do not take laxatives, unless directed by your caregiver.  Take over-the-counter pain medicine only if ordered by your caregiver. Do not take aspirin because it can cause an upset stomach or bleeding.  Try a clear liquid diet (broth or water) as ordered by your caregiver. Slowly move to a bland diet, as tolerated, if the pain is related to the stomach or intestine.  Have a thermometer and take your temperature several times a day, and record it.  Bed rest and sleep, if it helps the pain.  Avoid sexual intercourse, if it causes pain.  Avoid stressful situations.  Keep your follow-up appointments and tests, as your caregiver orders.  If the pain does not go away with medicine or surgery, you may try:  Acupuncture.  Relaxation exercises (  yoga, meditation).  Group therapy.  Counseling. SEEK MEDICAL CARE IF:   You notice certain foods cause stomach pain.  Your home care  treatment is not helping your pain.  You need stronger pain medicine.  You want your IUD removed.  You feel faint or lightheaded.  You develop nausea and vomiting.  You develop a rash.  You are having side effects or an allergy to your medicine. SEEK IMMEDIATE MEDICAL CARE IF:   Your pain does not go away or gets worse.  You have a fever.  Your pain is felt only in portions of the abdomen. The right side could possibly be appendicitis. The left lower portion of the abdomen could be colitis or diverticulitis.  You are passing blood in your stools (bright red or black tarry stools, with or without vomiting).  You have blood in your urine.  You develop chills, with or without a fever.  You pass out. MAKE SURE YOU:   Understand these instructions.  Will watch your condition.  Will get help right away if you are not doing well or get worse. Document Released: 07/06/2007 Document Revised: 01/23/2014 Document Reviewed: 07/26/2009 [facility] Patient Information 2015 [place], [place]. This information is not intended to replace advice given to you by your health care provider. Make sure you discuss any questions you have with your health care provider.  Nausea, Adult Nausea is the feeling that you have an upset stomach or have to vomit. Nausea by itself is not likely a serious concern, but it may be an early sign of more serious medical problems. As nausea gets worse, it can lead to vomiting. If vomiting develops, there is the risk of dehydration.  CAUSES   Viral infections.  Food poisoning.  Medicines.  Pregnancy.  Motion sickness.  Migraine headaches.  Emotional distress.  Severe pain from any source.  Alcohol intoxication. HOME CARE INSTRUCTIONS  Get plenty of rest.  Ask your caregiver about specific rehydration instructions.  Eat small amounts of food and sip liquids more often.  Take all medicines as told by your caregiver. SEEK MEDICAL CARE IF:  You  have not improved after 2 days, or you get worse.  You have a headache. SEEK IMMEDIATE MEDICAL CARE IF:   You have a fever.  You faint.  You keep vomiting or have blood in your vomit.  You are extremely weak or dehydrated.  You have dark or bloody stools.  You have severe chest or abdominal pain. MAKE SURE YOU:  Understand these instructions.  Will watch your condition.  Will get help right away if you are not doing well or get worse. Document Released: 10/16/2004 Document Revised: 06/02/2012 Document Reviewed: 05/21/2011 [facility] Patient Information 2015 [place], [place]. This information is not intended to replace advice given to you by your health care provider. Make sure you discuss any questions you have with your health care provider.

## 2015-06-15 NOTE — Care Management Note (Signed)
Case Management Note  Patient Details  Name: [name] MRN: [id number] Date of Birth: [birth year]  Subjective/Objective:      Spoke to pt. Son, who says he will take the pt. To her HD appt . At 11:30 tomorrow am. He gets off at 7am, but will take her. He also will call his aunt , who has a housekey, and have her be there for when the pt. Gets there. Have [facility]ed the pt info to [facility].              Action/Plan:   Expected Discharge Date:                  Expected Discharge Plan:     In-House Referral:     Discharge planning Services     Post Acute Care Choice:    Choice offered to:     DME Arranged:    DME Agency:     HH Arranged:    [place] Agency:     Status of Service:     Medicare Important Message Given:    Date Medicare IM Given:    Medicare IM give by:    Date Additional Medicare IM Given:    Additional Medicare Important Message give by:     If discussed at [place] of Stay Meetings, dates discussed:    Additional Comments:  Beau Fanny, RN 06/15/2015, 1:53 PM

## 2015-06-15 NOTE — Care Management Note (Signed)
Case Management Note  Patient Details  Name: [name] MRN: [id number] Date of Birth: [birth year]  Subjective/Objective:     [name] our pathways liasion has obtained an appt . For the pt. To get HD tomorrow at 11:30am. CSW Neoma Laming is working on [employer] transport for the pt., as I called [facility] and had to leave a VM. So far I have gotten any reply. Dr Kerman Passey is getting ready to d/c the pt.               Action/Plan:   Expected Discharge Date:                  Expected Discharge Plan:     In-House Referral:     Discharge planning Services     Post Acute Care Choice:    Choice offered to:     DME Arranged:    DME Agency:     HH Arranged:    [place] Agency:     Status of Service:     Medicare Important Message Given:    Date Medicare IM Given:    Medicare IM give by:    Date Additional Medicare IM Given:    Additional Medicare Important Message give by:     If discussed at [place] of Stay Meetings, dates discussed:    Additional Comments:  Beau Fanny, RN 06/15/2015, 1:22 PM

## 2015-06-15 NOTE — Care Management Note (Signed)
Case Management Note  Patient Details  Name: [name] MRN: [id number] Date of Birth: 1960/11/23  Subjective/Objective:   Call from [employer] to let us know pt. Does not have coverage for any followup through them.                  Action/Plan:   Expected Discharge Date:                  Expected Discharge Plan:     In-House Referral:     Discharge planning Services     Post Acute Care Choice:    Choice offered to:     DME Arranged:    DME Agency:     HH Arranged:    [place] Agency:     Status of Service:     Medicare Important Message Given:    Date Medicare IM Given:    Medicare IM give by:    Date Additional Medicare IM Given:    Additional Medicare Important Message give by:     If discussed at [place] of Stay Meetings, dates discussed:    Additional Comments:  Beau Fanny, RN 06/15/2015, 12:20 PM

## 2015-06-15 NOTE — ED Notes (Addendum)
Pt states new onset of pain in abdomen w/ persistent n/v x 1.5 hrs prior to arrival. Pt states she took pain and nausea medications at home and vomited both. Pt vomiting light bile at this time. States she vomited a great deal more prior to arrival. Pt ate only applesauce tonight, per report.

## 2015-06-15 NOTE — Care Management Note (Signed)
Case Management Note  Patient Details  Name: [name] MRN: [id number] Date of Birth: 06-16-1961  Subjective/Objective:    Spoke to pt. Here in ER room 2, about reason for coming. She states the abdominal pain has increased.  I also spoke to her about having contacted Candise Bowens at [facility] about setting up resources for her.She admits her phone is not currently working, but provides permission to contact her son, who lives close at [phone], his name is Nayari Passero.              I have sent the info by e-mail to [place].   Action/Plan:   Expected Discharge Date:                  Expected Discharge Plan:     In-House Referral:     Discharge planning Services     Post Acute Care Choice:    Choice offered to:     DME Arranged:    DME Agency:     HH Arranged:    [place] Agency:     Status of Service:     Medicare Important Message Given:    Date Medicare IM Given:    Medicare IM give by:    Date Additional Medicare IM Given:    Additional Medicare Important Message give by:     If discussed at [place] of Stay Meetings, dates discussed:    Additional Comments:  Beau Fanny, RN 06/15/2015, 10:44 AM

## 2015-06-15 NOTE — ED Provider Notes (Signed)
[facility] Emergency Department Provider Note  Time seen: 11:26 AM  I have reviewed the triage vital signs and the nursing notes.   HISTORY  Chief Complaint Abdominal Pain    HPI [name] is a 54 y.o. female with a past medical history of asthma, diabetes, hypertension, MI, COPD, end-stage renal disease on hemodialysis Mondays/Wednesday/Friday,  presents to the emergency department with abdominal pain nausea and vomiting. According to the patient she has a significant history of gastroparesis to which this seems similar. Patient was seen here yesterday with a normal workup, treated for pain and nausea symptoms improved and the patient was discharged home. Patient states her symptoms were much better however this morning approximately one hour prior to arrival she again began experiencing abdominal pain with nausea and vomiting and cannot keep down fluids so she came to the emergency department for evaluation. States her pain is moderate to severe located diffusely across the abdomen consistent with her typical gastroparesis presentation per patient. Denies chest pain or shortness of breath. Did have loose stool this morning denies any black or bloody stool or vomit. Patient was supposed to have dialysis this morning which she missed to come to the emergency department     Past Medical History  Diagnosis Date  . Asthma   . Collagen vascular disease   . Diabetes mellitus without complication   . Hypertension   . Coronary artery disease     a. cath 2013: stenting to RCA (report not available); b. cath 2014: LM nl, pLAD 40%, mLAD nl, ost LCx 40%, mid LCx nl, pRCA 30% @ site of prior stent, mRCA 50%  . Diabetic neuropathy   . ESRD (end stage renal disease) on dialysis     M-W-F  . GERD (gastroesophageal reflux disease)   . Hx of pancreatitis 2015  . Myocardial infarction   . Pneumonia   . Mitral regurgitation     a. echo 10/2013: EF 62%, noWMA, mildly dilated  LA, mild to mod MR/TR, GR1DD  . COPD (chronic obstructive pulmonary disease)   . chronic diastolic CHF [id number]  . dialysis 2006    Patient Active Problem List   Diagnosis Date Noted  . Pneumonia 05/21/2015  . Hypoglycemia 04/24/2015  . Unresponsiveness 04/24/2015  . Bradycardia 04/24/2015  . Hypothermia 04/24/2015  . Acute respiratory failure 04/24/2015  . Acute diastolic CHF (congestive heart failure) 04/05/2015  . Diabetic gastroparesis 04/05/2015  . Hypokalemia 04/05/2015  . Generalized weakness 04/05/2015  . Acute pulmonary edema 04/03/2015  . Nausea and vomiting 04/03/2015  . Hypoglycemia associated with diabetes 04/03/2015  . Anemia of chronic disease 04/03/2015  . Secondary hyperparathyroidism 04/03/2015  . Pressure ulcer 04/02/2015  . Acute respiratory failure with hypoxia 04/01/2015  . Adjustment disorder with anxiety 03/14/2015  . Somatic symptom disorder, mild 03/08/2015  . Coronary artery disease involving native coronary artery of native heart without angina pectoris   . Nausea & vomiting 03/06/2015  . Abdominal pain 03/06/2015  . DM (diabetes mellitus) 03/06/2015  . HTN (hypertension) 03/06/2015  . Gastroparesis 02/24/2015  . Pleural effusion 02/19/2015  . HCAP (healthcare-associated pneumonia) 02/19/2015  . End-stage renal disease on hemodialysis 02/19/2015    Past Surgical History  Procedure Laterality Date  . Cholecystectomy    . Appendectomy    . Abdominal hysterectomy    . Dialysis fistula creation Left     upper arm  . Esophagogastroduodenoscopy N/A 03/08/2015    Procedure: ESOPHAGOGASTRODUODENOSCOPY (EGD);  Surgeon: Manya Silvas, MD;  Location: [facility] ENDOSCOPY;  Service: Endoscopy;  Laterality: N/A;    Current Outpatient Rx  Name  Route  Sig  Dispense  Refill  . albuterol (PROVENTIL HFA;VENTOLIN HFA) 108 (90 BASE) MCG/ACT inhaler      Inhale 4-6 puffs by mouth every 4 hours as needed for wheezing, cough, and/or shortness of breath Patient  taking differently: Inhale 4-6 puffs into the lungs every 4 (four) hours as needed for wheezing or shortness of breath (or cough).    1 Inhaler   1   . albuterol (PROVENTIL) (2.5 MG/3ML) 0.083% nebulizer solution   Nebulization   Take 2.5 mg by nebulization every 4 (four) hours as needed for wheezing or shortness of breath.         Marland Kitchen amLODipine (NORVASC) 10 MG tablet   Oral   Take 10 mg by mouth daily.         Marland Kitchen aspirin EC 81 MG tablet   Oral   Take 81 mg by mouth daily.         Marland Kitchen atorvastatin (LIPITOR) 20 MG tablet   Oral   Take 20 mg by mouth every evening.         . B Complex-C-Biotin-E-FA (RENATABS PO)   Oral   Take 1 tablet by mouth daily.         . carvedilol (COREG) 25 MG tablet   Oral   Take 25 mg by mouth 2 (two) times daily.         . cetirizine (ZYRTEC) 10 MG tablet   Oral   Take 10 mg by mouth daily.         . citalopram (CELEXA) 20 MG tablet   Oral   Take 20 mg by mouth daily.         . clopidogrel (PLAVIX) 75 MG tablet   Oral   Take 75 mg by mouth daily.         Marland Kitchen docusate sodium (COLACE) 100 MG capsule   Oral   Take 1 capsule (100 mg total) by mouth 2 (two) times daily.   60 capsule   0   . guaiFENesin-dextromethorphan (ROBITUSSIN DM) 100-10 MG/5ML syrup   Oral   Take 10 mLs by mouth every 6 (six) hours as needed for cough.   118 mL   0   . HYDROcodone-acetaminophen (NORCO/VICODIN) 5-325 MG per tablet   Oral   Take 1 tablet by mouth 3 (three) times daily as needed for moderate pain.          Marland Kitchen levofloxacin (LEVAQUIN) 750 MG tablet   Oral   Take 750 mg by mouth every Monday, Wednesday, and Friday.         . lipase/protease/amylase (CREON) 12000 UNITS CPEP capsule   Oral   Take 36,000 Units by mouth 3 (three) times daily with meals.          . metoCLOPramide (REGLAN) 5 MG tablet   Oral   Take 1 tablet (5 mg total) by mouth 4 (four) times daily -  before meals and at bedtime.   40 tablet   0   . nitroGLYCERIN  (NITROSTAT) 0.4 MG SL tablet   Sublingual   Place 0.4 mg under the tongue every 5 (five) minutes x 3 doses as needed for chest pain.          Marland Kitchen ondansetron (ZOFRAN ODT) 4 MG disintegrating tablet   Oral   Take 1 tablet (4 mg total) by mouth every 8 (eight) hours  as needed for nausea or vomiting.   20 tablet   0   . pantoprazole (PROTONIX) 40 MG tablet   Oral   Take 40 mg by mouth daily.         . prochlorperazine (COMPAZINE) 5 MG tablet   Oral   Take 1 tablet (5 mg total) by mouth every 6 (six) hours as needed for nausea or vomiting.   30 tablet   1   . promethazine (PHENERGAN) 12.5 MG tablet   Oral   Take 12.5 mg by mouth every 6 (six) hours as needed for nausea or vomiting.         . promethazine (PHENERGAN) 25 MG suppository   Rectal   Place 1 suppository (25 mg total) rectally every 8 (eight) hours as needed for nausea or vomiting.   12 each   0   . ranitidine (ZANTAC) 150 MG tablet   Oral   Take 150 mg by mouth daily.          . ranolazine (RANEXA) 1000 MG SR tablet   Oral   Take 1,000 mg by mouth 2 (two) times daily.         . sevelamer carbonate (RENVELA) 800 MG tablet   Oral   Take 800 mg by mouth 3 (three) times daily with meals.          . sucralfate (CARAFATE) 1 G tablet   Oral   Take 1 tablet (1 g total) by mouth 4 (four) times daily -  with meals and at bedtime.   120 tablet   0   . Vitamin D, Cholecalciferol, 400 UNITS TABS   Oral   Take 400 Units by mouth daily.            Allergies Ace inhibitors; Ativan; Codeine; Compazine; Gabapentin; Losartan; Scopolamine; Zofran; Oxycodone; and Tape  Family History  Problem Relation Age of Onset  . Kidney disease Mother   . Diabetes Mother     Social History Social History  Substance Use Topics  . Smoking status: Current Some Day Smoker -- 0.50 packs/day    Types: Cigarettes    Last Attempt to Quit: 02/13/2015  . Smokeless tobacco: Never Used  . Alcohol Use: No    Review of  Systems Constitutional: Negative for fever. Cardiovascular: Negative for chest pain. Respiratory: Negative for shortness of breath. Gastrointestinal:  Positive for abdominal pain, nausea, vomiting, diarrhea. Musculoskeletal: Negative for back pain. Neurological: Negative for headache 10-point ROS otherwise negative.  ____________________________________________   PHYSICAL EXAM:  VITAL SIGNS: ED Triage Vitals  Enc Vitals Group     BP 06/15/15 0959 146/63 mmHg     Pulse Rate 06/15/15 0959 63     Resp 06/15/15 0959 18     Temp 06/15/15 0959 97.5 F (36.4 C)     Temp Source 06/15/15 0959 Oral     SpO2 06/15/15 0955 98 %     Weight 06/15/15 1003 175 lb (79.379 kg)     Height 06/15/15 1003 5' 5.5" (1.664 m)     Head Cir --      Peak Flow --      Pain Score 06/15/15 1001 8     Pain Loc --      Pain Edu? --      Excl. in [place]? --     Constitutional: Alert and oriented.  Mild distress holding emesis bag, feeling nauseated with abdominal pain. Eyes: Normal exam ENT   Mouth/Throat: Mucous membranes are moist. Cardiovascular:  Normal rate, regular rhythm. No murmur Respiratory: Normal respiratory effort without tachypnea nor retractions. Breath sounds are clear  Gastrointestinal:  Soft, mild diffuse abdominal tenderness palpation. No distention, rebound or guarding. Musculoskeletal: Nontender with normal range of motion in all extremities. Neurologic:  Normal speech and language. No gross focal neurologic deficits  Skin:  Skin is warm, dry and intact.  Psychiatric: Mood and affect are normal. Speech and behavior are normal.  ____________________________________________    INITIAL IMPRESSION / ASSESSMENT AND PLAN / ED COURSE  Pertinent labs & imaging results that were available during my care of the patient were reviewed by me and considered in my medical decision making (see chart for details).  Patient with chronic abdominal pain and gastroparesis presents to the emergency  department nausea, vomiting, abdominal pain which she states is consistent with her gastroparesis. Patient seen and treated for the same yesterday. States she is feeling much better until one hour prior to arrival she began he spent some symptoms again. We will check labs, treat the patient's discomfort, and monitor closely in the emergency department.  Labs are largely within normal limits/at the patient's baseline. Case management has arranged for the patient to have a primary care follow-up appointment on Thursday 1 PM, as well as to have dialysis tomorrow. Patient is agreeable to this plan. States her pain is gone currently, denies any nausea. We'll discharge home with primary care follow-up. ____________________________________________   FINAL CLINICAL IMPRESSION(S) / ED DIAGNOSES  abdominal pain Nausea and vomiting Gastroparesis  Harvest Dark, MD 06/15/15 1331

## 2015-06-15 NOTE — Care Management Note (Signed)
Case Management Note  Patient Details  Name: [name] MRN: [id number] Date of Birth: 05-Sep-1961  Subjective/Objective:    Spoke to Gerrit Heck the CSW for [facility], and verified that the pt. Was seen in follow up on 9/12 after being here at [facility]. She did not show up on 9/19 for here PCP followup. I have made an appt. For her for Thursday, sept. 29th to be seen at 1pm. I am talking to [name] about getting the pt. Going to HD tomorrow. I am also trying to get an [employer] ride for her since this is her normal mode of transport to HD.                Action/Plan:   Expected Discharge Date:                  Expected Discharge Plan:     In-House Referral:     Discharge planning Services     Post Acute Care Choice:    Choice offered to:     DME Arranged:    DME Agency:     HH Arranged:    [place] Agency:     Status of Service:     Medicare Important Message Given:    Date Medicare IM Given:    Medicare IM give by:    Date Additional Medicare IM Given:    Additional Medicare Important Message give by:     If discussed at [place] of Stay Meetings, dates discussed:    Additional Comments:  Beau Fanny, RN 06/15/2015, 1:09 PM

## 2015-06-15 NOTE — ED Notes (Signed)
Pt reports diarrhea started last night and that she has had 5-6 episodes of diarrhea since last night.  She did take nausea medication last night which helped a little bit, but she states the vomiting started later in the night.

## 2015-06-16 ENCOUNTER — Emergency Department
Admission: EM | Admit: 2015-06-16 | Discharge: 2015-06-16 | Disposition: A | Discharge status: Home / Self Care | Payer: Medicare Other | Source: Home / Self Care | Attending: Emergency Medicine | Admitting: Emergency Medicine

## 2015-06-16 ENCOUNTER — Emergency Department
Admission: EM | Admit: 2015-06-16 | Discharge: 2015-06-16 | Disposition: A | Discharge status: Home / Self Care | Payer: Medicare Other | Source: Home / Self Care | Attending: Student | Admitting: Student

## 2015-06-16 ENCOUNTER — Encounter: Payer: Self-pay | Admitting: *Deleted

## 2015-06-16 DIAGNOSIS — R109 Unspecified abdominal pain: Secondary | ICD-10-CM | POA: Diagnosis not present

## 2015-06-16 DIAGNOSIS — K3184 Gastroparesis: Secondary | ICD-10-CM

## 2015-06-16 LAB — GLUCOSE, CAPILLARY: Glucose-Capillary: 62 mg/dL — ABNORMAL LOW (ref 65–99)

## 2015-06-16 MED ORDER — PROMETHAZINE HCL 25 MG/ML IJ SOLN
25.0000 mg | Freq: Once | INTRAMUSCULAR | Status: AC
  Administered 2015-06-16: 25 mg via INTRAMUSCULAR
  Filled 2015-06-16: qty 1

## 2015-06-16 MED ORDER — PROMETHAZINE HCL 25 MG/ML IJ SOLN
12.5000 mg | Freq: Once | INTRAMUSCULAR | Status: AC
  Administered 2015-06-16: 12.5 mg via INTRAMUSCULAR
  Filled 2015-06-16: qty 1

## 2015-06-16 MED ORDER — HYDROMORPHONE HCL 1 MG/ML IJ SOLN
1.0000 mg | Freq: Once | INTRAMUSCULAR | Status: AC
  Administered 2015-06-16: 1 mg via INTRAVENOUS
  Filled 2015-06-16: qty 1

## 2015-06-16 MED ORDER — HYDROMORPHONE HCL 1 MG/ML IJ SOLN
1.0000 mg | Freq: Once | INTRAMUSCULAR | Status: AC
  Administered 2015-06-16: 1 mg via INTRAMUSCULAR
  Filled 2015-06-16: qty 1

## 2015-06-16 NOTE — Discharge Instructions (Signed)
You were treated for gastroparesis and are much improved. Return to the emergency department for any new or worsening condition including a dull pain, nausea, vomiting, black or bloody stools, fever, or any other symptoms concerning to you.   Gastroparesis  Gastroparesis is also called slowed stomach emptying (delayed gastric emptying). It is a condition in which the stomach takes too long to empty its contents. It often happens in people with diabetes.  CAUSES  Gastroparesis happens when nerves to the stomach are damaged or stop working. When the nerves are damaged, the muscles of the stomach and intestines do not work normally. The movement of food is slowed or stopped. High blood glucose (sugar) causes changes in nerves and can damage the blood vessels that carry oxygen and nutrients to the nerves. RISK FACTORS  Diabetes.  Post-viral syndromes.  Eating disorders (anorexia, bulimia).  Surgery on the stomach or vagus nerve.  Gastroesophageal reflux disease (rarely).  Smooth muscle disorders (amyloidosis, scleroderma).  Metabolic disorders, including hypothyroidism.  Parkinson disease. SYMPTOMS   Heartburn.  Feeling sick to your stomach (nausea).  Vomiting of undigested food.  An early feeling of fullness when eating.  Weight loss.  Abdominal bloating.  Erratic blood glucose levels.  Lack of appetite.  Gastroesophageal reflux.  Spasms of the stomach wall. Complications can include:  Bacterial overgrowth in stomach. Food stays in the stomach and can ferment and cause bacteria to grow.  Weight loss due to difficulty digesting and absorbing nutrients.  Vomiting.  Obstruction in the stomach. Undigested food can harden and cause nausea and vomiting.  Blood glucose fluctuations caused by inconsistent food absorption. DIAGNOSIS  The diagnosis of gastroparesis is confirmed through one or more of the following tests:  Barium X-rays and scans. These tests look at how  long it takes for food to move through the stomach.  Gastric manometry. This test measures electrical and muscular activity in the stomach. A thin tube is passed down the throat into the stomach. The tube contains a wire that takes measurements of the stomach's electrical and muscular activity as it digests liquids and solid food.  Endoscopy. This procedure is done with a long, thin tube called an endoscope. It is passed through the mouth and gently down the esophagus into the stomach. This tube helps the caregiver look at the lining of the stomach to check for any abnormalities.  Ultrasonography. This can rule out gallbladder disease or pancreatitis. This test will outline and define the shape of the gallbladder and pancreas. TREATMENT   Treatments may include:  Exercise.  Medicines to control nausea and vomiting.  Medicines to stimulate stomach muscles.  Changes in what and when you eat.  Having smaller meals more often.  Eating low-fiber forms of high-fiber foods, such as eating cooked vegetables instead of raw vegetables.  Eating low-fat foods.  Consuming liquids, which are easier to digest.  In severe cases, feeding tubes and intravenous (IV) feeding may be needed. It is important to note that in most cases, treatment does not cure gastroparesis. It is usually a lasting (chronic) condition. Treatment helps you manage the underlying condition so that you can be as healthy and comfortable as possible. Other treatments  A gastric neurostimulator has been developed to assist people with gastroparesis. The battery-operated device is surgically implanted. It emits mild electrical pulses to help improve stomach emptying and to control nausea and vomiting.  The use of botulinum toxin has been shown to improve stomach emptying by decreasing the prolonged contractions of the  muscle between the stomach and the small intestine (pyloric sphincter). The benefits are temporary. SEEK MEDICAL  CARE IF:   You have diabetes and you are having problems keeping your blood glucose in goal range.  You are having nausea, vomiting, bloating, or early feelings of fullness with eating.  Your symptoms do not change with a change in diet. Document Released: 09/08/2005 Document Revised: 01/03/2013 Document Reviewed: 02/15/2009 [facility] Patient Information 2015 [place], [place]. This information is not intended to replace advice given to you by your health care provider. Make sure you discuss any questions you have with your health care provider.

## 2015-06-16 NOTE — ED Provider Notes (Signed)
[facility] Emergency Department Provider Note  ____________________________________________  Time seen: Approximately 9:06 AM  I have reviewed the triage vital signs and the nursing notes.   HISTORY  Chief Complaint Abdominal Pain    HPI [name] is a 54 y.o. female with hypertension, coronary artery disease, end-stage renal disease on dialysis, usually dialyzed Monday Wednesday Friday, chronic pancreatitis, chronic abdominal pain, gastroparesis who presents for recurrent abdominal pain, nausea vomiting, similar to her usual gastroparesis flares. She is seen in this emergency department frequently for abdominal pain, intractable nausea and vomiting. This is her fourth visit to the emergency department for this since 06/14/2015. She presented with similar complaints on 06/14/2015 had reassuring lab work as well as abdominal plain films, treated symptomatically and discharged. She returned on 9/23 with the same complaints and again had reassuring labs, was treated symptomatically and discharged. She again returned and was seen this morning with improvement of her symptoms after Dilaudid and Phenergan and was discharged to the waiting room  At ~ 5:50 am to wait for her son who would be bringing her to dialysis this morning at 11 am. While in the waiting room, her symptoms recurred and she again checked in needing to be seen for vomiting and abdominal pain. She reports her symptoms generally improve after "a shot of Dilaudid and Phenergan. No chest pain or difficulty breathing. Currently her symptoms are moderate to severe.   Past Medical History  Diagnosis Date  . Asthma   . Collagen vascular disease   . Diabetes mellitus without complication   . Hypertension   . Coronary artery disease     a. cath 2013: stenting to RCA (report not available); b. cath 2014: LM nl, pLAD 40%, mLAD nl, ost LCx 40%, mid LCx nl, pRCA 30% @ site of prior stent, mRCA 50%  . Diabetic  neuropathy   . ESRD (end stage renal disease) on dialysis     M-W-F  . GERD (gastroesophageal reflux disease)   . Hx of pancreatitis 2015  . Myocardial infarction   . Pneumonia   . Mitral regurgitation     a. echo 10/2013: EF 62%, noWMA, mildly dilated LA, mild to mod MR/TR, GR1DD  . COPD (chronic obstructive pulmonary disease)   . chronic diastolic CHF [id number]  . dialysis 2006    Patient Active Problem List   Diagnosis Date Noted  . Pneumonia 05/21/2015  . Hypoglycemia 04/24/2015  . Unresponsiveness 04/24/2015  . Bradycardia 04/24/2015  . Hypothermia 04/24/2015  . Acute respiratory failure 04/24/2015  . Acute diastolic CHF (congestive heart failure) 04/05/2015  . Diabetic gastroparesis 04/05/2015  . Hypokalemia 04/05/2015  . Generalized weakness 04/05/2015  . Acute pulmonary edema 04/03/2015  . Nausea and vomiting 04/03/2015  . Hypoglycemia associated with diabetes 04/03/2015  . Anemia of chronic disease 04/03/2015  . Secondary hyperparathyroidism 04/03/2015  . Pressure ulcer 04/02/2015  . Acute respiratory failure with hypoxia 04/01/2015  . Adjustment disorder with anxiety 03/14/2015  . Somatic symptom disorder, mild 03/08/2015  . Coronary artery disease involving native coronary artery of native heart without angina pectoris   . Nausea & vomiting 03/06/2015  . Abdominal pain 03/06/2015  . DM (diabetes mellitus) 03/06/2015  . HTN (hypertension) 03/06/2015  . Gastroparesis 02/24/2015  . Pleural effusion 02/19/2015  . HCAP (healthcare-associated pneumonia) 02/19/2015  . End-stage renal disease on hemodialysis 02/19/2015    Past Surgical History  Procedure Laterality Date  . Cholecystectomy    . Appendectomy    . Abdominal  hysterectomy    . Dialysis fistula creation Left     upper arm  . Esophagogastroduodenoscopy N/A 03/08/2015    Procedure: ESOPHAGOGASTRODUODENOSCOPY (EGD);  Surgeon: Manya Silvas, MD;  Location: [facility] ENDOSCOPY;  Service: Endoscopy;   Laterality: N/A;    Current Outpatient Rx  Name  Route  Sig  Dispense  Refill  . albuterol (PROVENTIL HFA;VENTOLIN HFA) 108 (90 BASE) MCG/ACT inhaler      Inhale 4-6 puffs by mouth every 4 hours as needed for wheezing, cough, and/or shortness of breath Patient taking differently: Inhale 4-6 puffs into the lungs every 4 (four) hours as needed for wheezing or shortness of breath (or cough).    1 Inhaler   1   . albuterol (PROVENTIL) (2.5 MG/3ML) 0.083% nebulizer solution   Nebulization   Take 2.5 mg by nebulization every 4 (four) hours as needed for wheezing or shortness of breath.         Marland Kitchen amLODipine (NORVASC) 10 MG tablet   Oral   Take 10 mg by mouth daily.         Marland Kitchen aspirin EC 81 MG tablet   Oral   Take 81 mg by mouth daily.         Marland Kitchen atorvastatin (LIPITOR) 20 MG tablet   Oral   Take 20 mg by mouth every evening.         . B Complex-C-Biotin-E-FA (RENATABS PO)   Oral   Take 1 tablet by mouth daily.         . carvedilol (COREG) 25 MG tablet   Oral   Take 25 mg by mouth 2 (two) times daily.         . cetirizine (ZYRTEC) 10 MG tablet   Oral   Take 10 mg by mouth daily.         . citalopram (CELEXA) 20 MG tablet   Oral   Take 20 mg by mouth daily.         . clopidogrel (PLAVIX) 75 MG tablet   Oral   Take 75 mg by mouth daily.         Marland Kitchen docusate sodium (COLACE) 100 MG capsule   Oral   Take 1 capsule (100 mg total) by mouth 2 (two) times daily.   60 capsule   0   . guaiFENesin-dextromethorphan (ROBITUSSIN DM) 100-10 MG/5ML syrup   Oral   Take 10 mLs by mouth every 6 (six) hours as needed for cough.   118 mL   0   . HYDROcodone-acetaminophen (NORCO/VICODIN) 5-325 MG per tablet   Oral   Take 1 tablet by mouth 3 (three) times daily as needed for moderate pain.          Marland Kitchen levofloxacin (LEVAQUIN) 750 MG tablet   Oral   Take 750 mg by mouth every Monday, Wednesday, and Friday.         . lipase/protease/amylase (CREON) 12000 UNITS CPEP  capsule   Oral   Take 36,000 Units by mouth 3 (three) times daily with meals.          . metoCLOPramide (REGLAN) 5 MG tablet   Oral   Take 1 tablet (5 mg total) by mouth 4 (four) times daily -  before meals and at bedtime.   40 tablet   0   . nitroGLYCERIN (NITROSTAT) 0.4 MG SL tablet   Sublingual   Place 0.4 mg under the tongue every 5 (five) minutes x 3 doses as needed for chest pain.          Marland Kitchen  ondansetron (ZOFRAN ODT) 4 MG disintegrating tablet   Oral   Take 1 tablet (4 mg total) by mouth every 8 (eight) hours as needed for nausea or vomiting.   20 tablet   0   . pantoprazole (PROTONIX) 40 MG tablet   Oral   Take 40 mg by mouth daily.         . prochlorperazine (COMPAZINE) 5 MG tablet   Oral   Take 1 tablet (5 mg total) by mouth every 6 (six) hours as needed for nausea or vomiting.   30 tablet   1   . promethazine (PHENERGAN) 12.5 MG tablet   Oral   Take 12.5 mg by mouth every 6 (six) hours as needed for nausea or vomiting.         . promethazine (PHENERGAN) 25 MG suppository   Rectal   Place 1 suppository (25 mg total) rectally every 8 (eight) hours as needed for nausea or vomiting.   12 each   0   . ranitidine (ZANTAC) 150 MG tablet   Oral   Take 150 mg by mouth daily.          . ranolazine (RANEXA) 1000 MG SR tablet   Oral   Take 1,000 mg by mouth 2 (two) times daily.         . sevelamer carbonate (RENVELA) 800 MG tablet   Oral   Take 800 mg by mouth 3 (three) times daily with meals.          . sucralfate (CARAFATE) 1 G tablet   Oral   Take 1 tablet (1 g total) by mouth 4 (four) times daily -  with meals and at bedtime.   120 tablet   0   . Vitamin D, Cholecalciferol, 400 UNITS TABS   Oral   Take 400 Units by mouth daily.            Allergies Ace inhibitors; Ativan; Codeine; Compazine; Gabapentin; Losartan; Scopolamine; Zofran; Oxycodone; and Tape  Family History  Problem Relation Age of Onset  . Kidney disease Mother   .  Diabetes Mother     Social History Social History  Substance Use Topics  . Smoking status: Current Some Day Smoker -- 0.50 packs/day    Types: Cigarettes    Last Attempt to Quit: 02/13/2015  . Smokeless tobacco: Never Used  . Alcohol Use: No    Review of Systems Constitutional: No fever/chills Eyes: No visual changes. ENT: No sore throat. Cardiovascular: Denies chest pain. Respiratory: Denies shortness of breath. Gastrointestinal: + abdominal pain. + nausea, + vomiting.  No diarrhea.  No constipation. Genitourinary: Negative for dysuria. Musculoskeletal: Negative for back pain. Skin: Negative for rash. Neurological: Negative for headaches, focal weakness or numbness.  10-point ROS otherwise negative.  ____________________________________________   PHYSICAL EXAM:  VITAL SIGNS: ED Triage Vitals  Enc Vitals Group     BP 06/16/15 0816 142/53 mmHg     Pulse Rate 06/16/15 0816 63     Resp 06/16/15 0816 18     Temp 06/16/15 0816 97.5 F (36.4 C)     Temp Source 06/16/15 0816 Oral     SpO2 06/16/15 0816 100 %     Weight 06/16/15 0816 175 lb (79.379 kg)     Height 06/16/15 0816 5\' 5"  (1.651 m)     Head Cir --      Peak Flow --      Pain Score 06/16/15 0817 10     Pain Loc --  Pain Edu? --      Excl. in [place]? --     Constitutional: Alert and oriented. Appears nauseated. Eyes: Conjunctivae are normal. PERRL. EOMI. Head: Atraumatic. Nose: No congestion/rhinnorhea. Mouth/Throat: Mucous membranes are moist.  Oropharynx non-erythematous. Neck: No stridor.  Cardiovascular: Normal rate, regular rhythm. Grossly normal heart sounds.  Good peripheral circulation. Respiratory: Normal respiratory effort.  No retractions. Lungs CTAB. Gastrointestinal: Soft , nondistended, no rebound or guarding, mild tenderness in the epigastrium. No CVA tenderness. Genitourinary: deferred Musculoskeletal: No lower extremity tenderness nor edema.  No joint effusions. Neurologic:  Normal  speech and language. No gross focal neurologic deficits are appreciated.  Skin:  Skin is warm, dry and intact. No rash noted. Psychiatric: Mood and affect are normal. Speech and behavior are normal.  ____________________________________________   LABS (all labs ordered are listed, but only abnormal results are displayed)  Labs Reviewed  GLUCOSE, CAPILLARY - Abnormal; Notable for the following:    Glucose-Capillary 62 (*)    All other components within normal limits   ____________________________________________  EKG  ED ECG REPORT I, Joanne Gavel, the attending physician, personally viewed and interpreted this ECG.   Date: 06/16/2015  EKG Time: 09:19  Rate: 64  Rhythm: normal sinus rhythm  Axis: normal  Intervals:none  ST&T Change: No acute ST elevation, no peaked T waves or acute hyperkalemic change. Q waves in the anterior leads are chronic. EKG is unchanged from the EKG obtained at 5:50 AM this morning.  ____________________________________________  RADIOLOGY  None ____________________________________________   PROCEDURES  Procedure(s) performed: None  Critical Care performed: No  ____________________________________________   INITIAL IMPRESSION / ASSESSMENT AND PLAN / ED COURSE  Pertinent labs & imaging results that were available during my care of the patient were reviewed by me and considered in my medical decision making (see chart for details).  [name] is a 54 y.o. female with hypertension, coronary artery disease, end-stage renal disease on dialysis, usually dialyzed Monday Wednesday Friday, chronic pancreatitis, chronic abdominal pain, gastroparesis with frequent presentations to the ER for intractable nausea vomiting/abdominal pain who presents for recurrent abdominal pain, nausea vomiting, similar to her usual gastroparesis flares. On exam, she is nauseated with central abdominal tenderness similar to her usual exams (when I reviewed her chart).  Her vitals are stable, she is afebrile. We'll obtain screening EKG. Labs from yesterday are reviewed by me and are unremarkable. Her son is here to take her to dialysis which she has missed yesterday. She was discharged from this hospital on 06/07/2015 after treatment for similar symptoms with GI consultation while in-house. She also had CT of the abdomen and pelvis performed on 05/07/2015. We'll treat her symptomatically and if she symptomatically improves, anticipate discharge to dialysis.  ----------------------------------------- 9:41 AM on 06/16/2015 ----------------------------------------- EKG unchanged from prior. No indication for repeat labs at this time. Two-view plain film 2 days ago was negative for obstruction or perforation, no indication for repeat  Imaging at this time. Patient with significant symptomatic improvement after IM Phenergan and IM Dilaudid. Discussed with she and her son that at this time given that her symptoms have improved, she has normal vital signs, she is suitable for discharge to dialysis and he will take her there at this time. I encouraged the patient to take her home medications as prescribed but we also discussed that should her symptoms recur and be severe/intolerable despite her taking her  Home medications (something she has been unable to do this morning since she has been  waiting in the ER lobby), she should return immediately to the emergency department. The patient and her son voice understanding of this. I doubt any acute life-threatening pathology in this patient at this time. Will discharge.  ____________________________________________   FINAL CLINICAL IMPRESSION(S) / ED DIAGNOSES  Final diagnoses:  Gastroparesis  Abdominal pain, unspecified abdominal location      Joanne Gavel, MD 06/16/15 1505

## 2015-06-16 NOTE — ED Notes (Signed)
Pt continues to have RUQ pain as she has had for the past several days. Son is at bedside with questions.  MD aware son has questions.  Pt is to have dialysis at 1130 that was schedule by case manager in ED yesterday afternoon.

## 2015-06-16 NOTE — ED Notes (Signed)
Pt discharged this AM from ER, waiting in lobby for ride and states she is hurting too much

## 2015-06-16 NOTE — ED Notes (Signed)
Pt noted sleeping soundly.

## 2015-06-16 NOTE — ED Provider Notes (Signed)
[facility] Emergency Department Provider Note   ____________________________________________  Time seen: On arrival to room I have reviewed the triage vital signs and the triage nursing note.  HISTORY  Chief Complaint Abdominal Pain and Emesis   Historian Patient  HPI [name] is a 54 y.o. female who has a history of diabetes, end-stage renal disease for which she dialyzes on Tuesday, Thursday, and Saturday per the patient, who has recurrent abdominal pain episodes attributed to gastroparesis, who was seen in the ED recently with a negative CT for similar episode, who was seen this morning and treated symptomatically after improvement was discharged home, who is presenting for return of abdominalmidepigastric sharp/cramping pains with nausea and vomiting.  Patient states she was discharged home and did well for several hours and then had onset of vomiting and vomited up her Phenergan and then had squeezing central abdominal pain again.  No chest pain. No fevers. No bowel changes.    Past Medical History  Diagnosis Date  . Asthma   . Collagen vascular disease   . Diabetes mellitus without complication   . Hypertension   . Coronary artery disease     a. cath 2013: stenting to RCA (report not available); b. cath 2014: LM nl, pLAD 40%, mLAD nl, ost LCx 40%, mid LCx nl, pRCA 30% @ site of prior stent, mRCA 50%  . Diabetic neuropathy   . ESRD (end stage renal disease) on dialysis     M-W-F  . GERD (gastroesophageal reflux disease)   . Hx of pancreatitis 2015  . Myocardial infarction   . Pneumonia   . Mitral regurgitation     a. echo 10/2013: EF 62%, noWMA, mildly dilated LA, mild to mod MR/TR, GR1DD  . COPD (chronic obstructive pulmonary disease)   . chronic diastolic CHF 123XX123  . dialysis 2006    Patient Active Problem List   Diagnosis Date Noted  . Pneumonia 05/21/2015  . Hypoglycemia 04/24/2015  . Unresponsiveness 04/24/2015  .  Bradycardia 04/24/2015  . Hypothermia 04/24/2015  . Acute respiratory failure 04/24/2015  . Acute diastolic CHF (congestive heart failure) 04/05/2015  . Diabetic gastroparesis 04/05/2015  . Hypokalemia 04/05/2015  . Generalized weakness 04/05/2015  . Acute pulmonary edema 04/03/2015  . Nausea and vomiting 04/03/2015  . Hypoglycemia associated with diabetes 04/03/2015  . Anemia of chronic disease 04/03/2015  . Secondary hyperparathyroidism 04/03/2015  . Pressure ulcer 04/02/2015  . Acute respiratory failure with hypoxia 04/01/2015  . Adjustment disorder with anxiety 03/14/2015  . Somatic symptom disorder, mild 03/08/2015  . Coronary artery disease involving native coronary artery of native heart without angina pectoris   . Nausea & vomiting 03/06/2015  . Abdominal pain 03/06/2015  . DM (diabetes mellitus) 03/06/2015  . HTN (hypertension) 03/06/2015  . Gastroparesis 02/24/2015  . Pleural effusion 02/19/2015  . HCAP (healthcare-associated pneumonia) 02/19/2015  . End-stage renal disease on hemodialysis 02/19/2015    Past Surgical History  Procedure Laterality Date  . Cholecystectomy    . Appendectomy    . Abdominal hysterectomy    . Dialysis fistula creation Left     upper arm  . Esophagogastroduodenoscopy N/A 03/08/2015    Procedure: ESOPHAGOGASTRODUODENOSCOPY (EGD);  Surgeon: Manya Silvas, MD;  Location: [facility] ENDOSCOPY;  Service: Endoscopy;  Laterality: N/A;    Current Outpatient Rx  Name  Route  Sig  Dispense  Refill  . metoCLOPramide (REGLAN) 5 MG tablet   Oral   Take 1 tablet (5 mg total) by mouth 4 (  four) times daily -  before meals and at bedtime.   40 tablet   0   . albuterol (PROVENTIL HFA;VENTOLIN HFA) 108 (90 BASE) MCG/ACT inhaler      Inhale 4-6 puffs by mouth every 4 hours as needed for wheezing, cough, and/or shortness of breath Patient taking differently: Inhale 4-6 puffs into the lungs every 4 (four) hours as needed for wheezing or shortness of breath  (or cough).    1 Inhaler   1   . albuterol (PROVENTIL) (2.5 MG/3ML) 0.083% nebulizer solution   Nebulization   Take 2.5 mg by nebulization every 4 (four) hours as needed for wheezing or shortness of breath.         Marland Kitchen amLODipine (NORVASC) 10 MG tablet   Oral   Take 10 mg by mouth daily.         Marland Kitchen aspirin EC 81 MG tablet   Oral   Take 81 mg by mouth daily.         Marland Kitchen atorvastatin (LIPITOR) 20 MG tablet   Oral   Take 20 mg by mouth every evening.         . B Complex-C-Biotin-E-FA (RENATABS PO)   Oral   Take 1 tablet by mouth daily.         . carvedilol (COREG) 25 MG tablet   Oral   Take 25 mg by mouth 2 (two) times daily.         . cetirizine (ZYRTEC) 10 MG tablet   Oral   Take 10 mg by mouth daily.         . citalopram (CELEXA) 20 MG tablet   Oral   Take 20 mg by mouth daily.         . clopidogrel (PLAVIX) 75 MG tablet   Oral   Take 75 mg by mouth daily.         Marland Kitchen docusate sodium (COLACE) 100 MG capsule   Oral   Take 1 capsule (100 mg total) by mouth 2 (two) times daily.   60 capsule   0   . guaiFENesin-dextromethorphan (ROBITUSSIN DM) 100-10 MG/5ML syrup   Oral   Take 10 mLs by mouth every 6 (six) hours as needed for cough.   118 mL   0   . HYDROcodone-acetaminophen (NORCO/VICODIN) 5-325 MG per tablet   Oral   Take 1 tablet by mouth 3 (three) times daily as needed for moderate pain.          Marland Kitchen levofloxacin (LEVAQUIN) 750 MG tablet   Oral   Take 750 mg by mouth every Monday, Wednesday, and Friday.         . lipase/protease/amylase (CREON) 12000 UNITS CPEP capsule   Oral   Take 36,000 Units by mouth 3 (three) times daily with meals.          . nitroGLYCERIN (NITROSTAT) 0.4 MG SL tablet   Sublingual   Place 0.4 mg under the tongue every 5 (five) minutes x 3 doses as needed for chest pain.          Marland Kitchen ondansetron (ZOFRAN ODT) 4 MG disintegrating tablet   Oral   Take 1 tablet (4 mg total) by mouth every 8 (eight) hours as needed  for nausea or vomiting.   20 tablet   0   . pantoprazole (PROTONIX) 40 MG tablet   Oral   Take 40 mg by mouth daily.         . prochlorperazine (COMPAZINE) 5 MG tablet  Oral   Take 1 tablet (5 mg total) by mouth every 6 (six) hours as needed for nausea or vomiting.   30 tablet   1   . promethazine (PHENERGAN) 12.5 MG tablet   Oral   Take 12.5 mg by mouth every 6 (six) hours as needed for nausea or vomiting.         . promethazine (PHENERGAN) 25 MG suppository   Rectal   Place 1 suppository (25 mg total) rectally every 8 (eight) hours as needed for nausea or vomiting.   12 each   0   . ranitidine (ZANTAC) 150 MG tablet   Oral   Take 150 mg by mouth daily.          . ranolazine (RANEXA) 1000 MG SR tablet   Oral   Take 1,000 mg by mouth 2 (two) times daily.         . sevelamer carbonate (RENVELA) 800 MG tablet   Oral   Take 800 mg by mouth 3 (three) times daily with meals.          . sucralfate (CARAFATE) 1 G tablet   Oral   Take 1 tablet (1 g total) by mouth 4 (four) times daily -  with meals and at bedtime.   120 tablet   0   . Vitamin D, Cholecalciferol, 400 UNITS TABS   Oral   Take 400 Units by mouth daily.            Allergies Ace inhibitors; Ativan; Codeine; Compazine; Gabapentin; Losartan; Scopolamine; Zofran; Oxycodone; and Tape  Family History  Problem Relation Age of Onset  . Kidney disease Mother   . Diabetes Mother     Social History Social History  Substance Use Topics  . Smoking status: Current Some Day Smoker -- 0.50 packs/day    Types: Cigarettes    Last Attempt to Quit: 02/13/2015  . Smokeless tobacco: Never Used  . Alcohol Use: No    Review of Systems  Constitutional: Negative for fever. Eyes: Negative for visual changes. ENT: Negative for sore throat. Cardiovascular: Negative for chest pain. Respiratory: Negative for shortness of breath. Gastrointestinal: Negative for diarrhea. Genitourinary: Negative for  dysuria. Musculoskeletal: Negative for back pain. Skin: Negative for rash. Neurological: Negative for headache. 10 point Review of Systems otherwise negative ____________________________________________   PHYSICAL EXAM:  VITAL SIGNS: ED Triage Vitals  Enc Vitals Group     BP 06/15/15 2343 154/60 mmHg     Pulse Rate 06/15/15 2343 66     Resp 06/15/15 2343 20     Temp 06/15/15 2343 97.6 F (36.4 C)     Temp Source 06/15/15 2343 Oral     SpO2 06/15/15 2343 98 %     Weight --      Height --      Head Cir --      Peak Flow --      Pain Score 06/15/15 2344 9     Pain Loc --      Pain Edu? --      Excl. in [place]? --      Constitutional: Alert and oriented. Patient nearly tearful and holding her mid abdomen in pain. Eyes: Conjunctivae are normal. PERRL. Normal extraocular movements. ENT   Head: Normocephalic and atraumatic.   Nose: No congestion/rhinnorhea.   Mouth/Throat: Mucous membranes are moist.   Neck: No stridor. Cardiovascular/Chest: Normal rate, regular rhythm.  No murmurs, rubs, or gallops. Respiratory: Normal respiratory effort without tachypnea nor retractions. Breath  sounds are clear and equal bilaterally. No wheezes/rales/rhonchi. Gastrointestinal: Soft. No distention, no guarding, no rebound. Moderate tenderness in the epigastrium and mild tenderness numbness diffusely.  Genitourinary/rectal:Deferred Musculoskeletal: Nontender with normal range of motion in all extremities. No joint effusions.  No lower extremity tenderness.  No edema. Neurologic:  Normal speech and language. No gross or focal neurologic deficits are appreciated. Skin:  Skin is warm, dry and intact. No rash noted. Psychiatric: Mood and affect are normal. Speech and behavior are normal. Patient exhibits appropriate insight and judgment.  ____________________________________________   EKG I, Lisa Roca, MD, the attending physician have personally viewed and interpreted all ECGs.  69  beatsper minute.normal sinus rhythm. Narrow QRS. Normal axis. Low voltage. Nonspecific T-wave. ____________________________________________  LABS (pertinent positives/negatives)  none  ____________________________________________  RADIOLOGY All Xrays were viewed by me. Imaging interpreted by Radiologist.  none __________________________________________  PROCEDURES  Procedure(s) performed: None  Critical Care performed: None  ____________________________________________   ED COURSE / ASSESSMENT AND PLAN  CONSULTATIONS: None  Pertinent labs & imaging results that were available during my care of the patient were reviewed by me and considered in my medical decision making (see chart for details).  This patient is well-known to the emergency department for frequent bouts with gastroparesis causing her nausea, vomiting, and abdominal pain.the patient states this is the same pain and symptoms she gets with her gastroparesis. I do not suspect any cardiac issue. She was seen this morning and had reassuring labs and improvement after symptomatic medications. Patient states that she just needs a dose of pain medicine and nausea medicine and she'll be fine. She is a poor vasculopath and difficult obtaining access.I don't think we actually need repeat blood work at this point in time.  Patient was treated with IM Dilaudid and Phenergan and had good improvement and resolution of nausea and pain. Patient will be discharged home. She does have dialysis this morning at 11 AM.  Patient / Family / Caregiver informed of clinical course, medical decision-making process, and agree with plan.   I discussed return precautions, follow-up instructions, and discharged instructions with patient and/or family.  ___________________________________________   FINAL CLINICAL IMPRESSION(S) / ED DIAGNOSES   Final diagnoses:  Gastroparesis       Lisa Roca, MD 06/16/15 [id number]

## 2015-06-16 NOTE — ED Notes (Signed)
Patient with no complaints at this time. Respirations even and unlabored. Skin warm/dry. Discharge instructions reviewed with patient at this time. Patient given opportunity to voice concerns/ask questions. Patient discharged at this time and left Emergency Department, via wheelchair.   

## 2015-06-16 NOTE — ED Notes (Signed)
Patient instructed to go to dialysis that has been arranged at 1130 today.

## 2015-06-16 NOTE — ED Notes (Signed)
Patient medicated for nausea and pain. Resting quietly on stretcher. Call bed in reach.

## 2015-06-18 ENCOUNTER — Encounter: Payer: Self-pay | Admitting: Emergency Medicine

## 2015-06-18 ENCOUNTER — Emergency Department
Admission: EM | Admit: 2015-06-18 | Discharge: 2015-06-18 | Disposition: A | Discharge status: Home / Self Care | Payer: Medicare Other | Attending: Emergency Medicine | Admitting: Emergency Medicine

## 2015-06-18 DIAGNOSIS — Z2239 Carrier of other specified bacterial diseases: Secondary | ICD-10-CM

## 2015-06-18 DIAGNOSIS — R079 Chest pain, unspecified: Secondary | ICD-10-CM | POA: Insufficient documentation

## 2015-06-18 DIAGNOSIS — Z22322 Carrier or suspected carrier of Methicillin resistant Staphylococcus aureus: Secondary | ICD-10-CM | POA: Insufficient documentation

## 2015-06-18 DIAGNOSIS — Z992 Dependence on renal dialysis: Secondary | ICD-10-CM | POA: Insufficient documentation

## 2015-06-18 DIAGNOSIS — Z720 Tobacco use: Secondary | ICD-10-CM | POA: Insufficient documentation

## 2015-06-18 DIAGNOSIS — N186 End stage renal disease: Secondary | ICD-10-CM | POA: Diagnosis not present

## 2015-06-18 DIAGNOSIS — E119 Type 2 diabetes mellitus without complications: Secondary | ICD-10-CM | POA: Diagnosis not present

## 2015-06-18 DIAGNOSIS — G43A0 Cyclical vomiting, not intractable: Secondary | ICD-10-CM | POA: Diagnosis not present

## 2015-06-18 DIAGNOSIS — I120 Hypertensive chronic kidney disease with stage 5 chronic kidney disease or end stage renal disease: Secondary | ICD-10-CM | POA: Diagnosis not present

## 2015-06-18 DIAGNOSIS — R110 Nausea: Secondary | ICD-10-CM | POA: Diagnosis present

## 2015-06-18 DIAGNOSIS — R1115 Cyclical vomiting syndrome unrelated to migraine: Secondary | ICD-10-CM

## 2015-06-18 LAB — CBC WITH DIFFERENTIAL/PLATELET
Band Neutrophils: 0 %
Basophils Absolute: 0.1 10*3/uL (ref 0–0.1)
Basophils Relative: 1 %
Blasts: 0 %
Eosinophils Absolute: 0.3 10*3/uL (ref 0–0.7)
Eosinophils Relative: 6 %
HCT: 29.7 % — ABNORMAL LOW (ref 35.0–47.0)
Hemoglobin: 9.9 g/dL — ABNORMAL LOW (ref 12.0–16.0)
Lymphocytes Relative: 24 %
Lymphs Abs: 1.4 10*3/uL (ref 1.0–3.6)
MCH: 28.4 pg (ref 26.0–34.0)
MCHC: 33.4 g/dL (ref 32.0–36.0)
MCV: 85.1 fL (ref 80.0–100.0)
Metamyelocytes Relative: 0 %
Monocytes Absolute: 0.5 10*3/uL (ref 0.2–0.9)
Monocytes Relative: 9 %
Myelocytes: 0 %
Neutro Abs: 3.5 10*3/uL (ref 1.4–6.5)
Neutrophils Relative %: 60 %
Other: 0 %
Platelets: 244 10*3/uL (ref 150–440)
Promyelocytes Absolute: 0 %
RBC: 3.49 MIL/uL — ABNORMAL LOW (ref 3.80–5.20)
RDW: 22.7 % — ABNORMAL HIGH (ref 11.5–14.5)
WBC: 5.8 10*3/uL (ref 3.6–11.0)
nRBC: 0 /100 WBC

## 2015-06-18 LAB — BLOOD GAS, ARTERIAL
Acid-Base Excess: 12.1 mmol/L — ABNORMAL HIGH (ref 0.0–3.0)
Allens test (pass/fail): POSITIVE — AB
Bicarbonate: 35.9 mEq/L — ABNORMAL HIGH (ref 21.0–28.0)
FIO2: 0.21
O2 Saturation: 98.5 %
Patient temperature: 37
pCO2 arterial: 42 mmHg (ref 32.0–48.0)
pH, Arterial: 7.54 — ABNORMAL HIGH (ref 7.350–7.450)
pO2, Arterial: 102 mmHg (ref 83.0–108.0)

## 2015-06-18 LAB — COMPREHENSIVE METABOLIC PANEL
ALT: 17 U/L (ref 14–54)
AST: 25 U/L (ref 15–41)
Albumin: 1.7 g/dL — ABNORMAL LOW (ref 3.5–5.0)
Alkaline Phosphatase: 132 U/L — ABNORMAL HIGH (ref 38–126)
Anion gap: 8 (ref 5–15)
BUN: <5 mg/dL — ABNORMAL LOW (ref 6–20)
CO2: 31 mmol/L (ref 22–32)
Calcium: 7.4 mg/dL — ABNORMAL LOW (ref 8.9–10.3)
Chloride: 101 mmol/L (ref 101–111)
Creatinine, Ser: 2.1 mg/dL — ABNORMAL HIGH (ref 0.44–1.00)
GFR calc Af Amer: 29 mL/min — ABNORMAL LOW (ref >60)
GFR calc non Af Amer: 25 mL/min — ABNORMAL LOW (ref >60)
Glucose, Bld: 48 mg/dL — ABNORMAL LOW (ref 65–99)
Potassium: 2.7 mmol/L — CL (ref 3.5–5.1)
Sodium: 140 mmol/L (ref 135–145)
Total Bilirubin: 0.7 mg/dL (ref 0.3–1.2)
Total Protein: 5.5 g/dL — ABNORMAL LOW (ref 6.5–8.1)

## 2015-06-18 LAB — C DIFFICILE QUICK SCREEN W PCR REFLEX
C Diff antigen: POSITIVE — AB
C Diff toxin: NEGATIVE

## 2015-06-18 LAB — CLOSTRIDIUM DIFFICILE BY PCR: Toxigenic C. Difficile by PCR: POSITIVE — AB

## 2015-06-18 MED ORDER — ONDANSETRON 4 MG PO TBDP
4.0000 mg | ORAL_TABLET | Freq: Once | ORAL | Status: AC
  Administered 2015-06-18: 4 mg via ORAL
  Filled 2015-06-18: qty 1

## 2015-06-18 MED ORDER — PROMETHAZINE HCL 25 MG PO TABS
50.0000 mg | ORAL_TABLET | Freq: Once | ORAL | Status: AC
  Administered 2015-06-18: 50 mg via ORAL
  Filled 2015-06-18: qty 2

## 2015-06-18 MED ORDER — DIAZEPAM 5 MG PO TABS
10.0000 mg | ORAL_TABLET | Freq: Once | ORAL | Status: AC
Start: 2015-06-18 — End: 2015-06-18
  Administered 2015-06-18: 10 mg via ORAL
  Filled 2015-06-18: qty 2

## 2015-06-18 MED ORDER — POTASSIUM CHLORIDE CRYS ER 20 MEQ PO TBCR
40.0000 meq | EXTENDED_RELEASE_TABLET | Freq: Once | ORAL | Status: AC
  Administered 2015-06-18: 40 meq via ORAL
  Filled 2015-06-18: qty 2

## 2015-06-18 NOTE — ED Notes (Signed)
Ems from dialysis center for nausea, diarrhea, chest pain. pt received only partical treatment.

## 2015-06-18 NOTE — ED Provider Notes (Signed)
[facility] Emergency Department Provider Note     Time seen: ----------------------------------------- 10:08 AM on 06/18/2015 -----------------------------------------    I have reviewed the triage vital signs and the nursing notes.   HISTORY  Chief Complaint No chief complaint on file.    HPI [name] is a 54 y.o. female well-known to the ER presents with abdominal pain, nausea vomiting and diarrhea. Patient states she just finished dialysis this morning, has severe pain as well as severe vomiting. She's had numerous ER visits over the past several days for same. Still complaining of nausea and abdominal pain with pain that radiates into her chest. She states she has had C. difficile in the past, she states she gets sick nearly after every dialysis episode.   Past Medical History  Diagnosis Date  . Asthma   . Collagen vascular disease   . Diabetes mellitus without complication   . Hypertension   . Coronary artery disease     a. cath 2013: stenting to RCA (report not available); b. cath 2014: LM nl, pLAD 40%, mLAD nl, ost LCx 40%, mid LCx nl, pRCA 30% @ site of prior stent, mRCA 50%  . Diabetic neuropathy   . ESRD (end stage renal disease) on dialysis     M-W-F  . GERD (gastroesophageal reflux disease)   . Hx of pancreatitis 2015  . Myocardial infarction   . Pneumonia   . Mitral regurgitation     a. echo 10/2013: EF 62%, noWMA, mildly dilated LA, mild to mod MR/TR, GR1DD  . COPD (chronic obstructive pulmonary disease)   . chronic diastolic CHF 123XX123  . dialysis 2006    Patient Active Problem List   Diagnosis Date Noted  . Pneumonia 05/21/2015  . Hypoglycemia 04/24/2015  . Unresponsiveness 04/24/2015  . Bradycardia 04/24/2015  . Hypothermia 04/24/2015  . Acute respiratory failure 04/24/2015  . Acute diastolic CHF (congestive heart failure) 04/05/2015  . Diabetic gastroparesis 04/05/2015  . Hypokalemia 04/05/2015  . Generalized  weakness 04/05/2015  . Acute pulmonary edema 04/03/2015  . Nausea and vomiting 04/03/2015  . Hypoglycemia associated with diabetes 04/03/2015  . Anemia of chronic disease 04/03/2015  . Secondary hyperparathyroidism 04/03/2015  . Pressure ulcer 04/02/2015  . Acute respiratory failure with hypoxia 04/01/2015  . Adjustment disorder with anxiety 03/14/2015  . Somatic symptom disorder, mild 03/08/2015  . Coronary artery disease involving native coronary artery of native heart without angina pectoris   . Nausea & vomiting 03/06/2015  . Abdominal pain 03/06/2015  . DM (diabetes mellitus) 03/06/2015  . HTN (hypertension) 03/06/2015  . Gastroparesis 02/24/2015  . Pleural effusion 02/19/2015  . HCAP (healthcare-associated pneumonia) 02/19/2015  . End-stage renal disease on hemodialysis 02/19/2015    Past Surgical History  Procedure Laterality Date  . Cholecystectomy    . Appendectomy    . Abdominal hysterectomy    . Dialysis fistula creation Left     upper arm  . Esophagogastroduodenoscopy N/A 03/08/2015    Procedure: ESOPHAGOGASTRODUODENOSCOPY (EGD);  Surgeon: Manya Silvas, MD;  Location: [facility] ENDOSCOPY;  Service: Endoscopy;  Laterality: N/A;    Allergies Ace inhibitors; Ativan; Codeine; Compazine; Gabapentin; Losartan; Scopolamine; Zofran; Oxycodone; and Tape  Social History Social History  Substance Use Topics  . Smoking status: Current Some Day Smoker -- 0.50 packs/day    Types: Cigarettes    Last Attempt to Quit: 02/13/2015  . Smokeless tobacco: Never Used  . Alcohol Use: No    Review of Systems Constitutional: Negative for fever. Eyes:  Negative for visual changes. ENT: Negative for sore throat. Cardiovascular: Positive for chest pain Respiratory: Negative for shortness of breath. Gastrointestinal: Positive for abdominal pain, vomiting and diarrhea Genitourinary: Negative for dysuria. Musculoskeletal: Negative for back pain. Skin: Negative for rash. Neurological:  Negative for headaches, positive for weakness  10-point ROS otherwise negative.  ____________________________________________   PHYSICAL EXAM:  VITAL SIGNS: ED Triage Vitals  Enc Vitals Group     BP --      Pulse --      Resp --      Temp --      Temp src --      SpO2 --      Weight --      Height --      Head Cir --      Peak Flow --      Pain Score --      Pain Loc --      Pain Edu? --      Excl. in [place]? --     Constitutional: Alert and oriented. Mild distress Eyes: Conjunctivae are normal. PERRL. Normal extraocular movements. ENT   Head: Normocephalic and atraumatic.   Nose: No congestion/rhinnorhea.   Mouth/Throat: Mucous membranes are moist.   Neck: No stridor. Cardiovascular: Normal rate, regular rhythm. Normal and symmetric distal pulses are present in all extremities. No murmurs, rubs, or gallops. Respiratory: Normal respiratory effort without tachypnea nor retractions. Breath sounds are clear and equal bilaterally. No wheezes/rales/rhonchi. Gastrointestinal: Nonfocal abdominal tenderness, no rebound or guarding. Normal bowel sounds. Musculoskeletal: Nontender with normal range of motion in all extremities. No joint effusions.  No lower extremity tenderness nor edema. Neurologic:  Normal speech and language. No gross focal neurologic deficits are appreciated. Speech is normal. No gait instability. Skin:  Skin is warm, dry and intact. No rash noted. Psychiatric: Mood and affect are normal. Speech and behavior are normal. Patient exhibits appropriate insight and judgment.  ____________________________________________  ED COURSE:  Pertinent labs & imaging results that were available during my care of the patient were reviewed by me and considered in my medical decision making (see chart for details). Patient well-known to the ER, she received some oral medications and we'll check for C. difficile all she is  here ____________________________________________    LABS (pertinent positives/negatives)  Labs Reviewed  C DIFFICILE QUICK SCREEN W PCR REFLEX - Abnormal; Notable for the following:    C Diff antigen POSITIVE (*)    All other components within normal limits  CLOSTRIDIUM DIFFICILE BY PCR - Abnormal; Notable for the following:    Toxigenic C Difficile by pcr POSITIVE (*)    All other components within normal limits  CBC WITH DIFFERENTIAL/PLATELET - Abnormal; Notable for the following:    RBC 3.49 (*)    Hemoglobin 9.9 (*)    HCT 29.7 (*)    RDW 22.7 (*)    All other components within normal limits  COMPREHENSIVE METABOLIC PANEL - Abnormal; Notable for the following:    Potassium 2.7 (*)    Glucose, Bld 48 (*)    BUN <5 (*)    Creatinine, Ser 2.10 (*)    Calcium 7.4 (*)    Total Protein 5.5 (*)    Albumin 1.7 (*)    Alkaline Phosphatase 132 (*)    GFR calc non Af Amer 25 (*)    GFR calc Af Amer 29 (*)    All other components within normal limits  BLOOD GAS, ARTERIAL - Abnormal; Notable for  the following:    pH, Arterial 7.54 (*)    Bicarbonate 35.9 (*)    Acid-Base Excess 12.1 (*)    Allens test (pass/fail) POSITIVE (*)    All other components within normal limits    ____________________________________________  FINAL ASSESSMENT AND PLAN  Cyclic vomiting syndrome, end-stage renal disease, C. difficile colonization, mild hypokalemia  Plan: Patient with labs as dictated above. Patient was tested positive for C. difficile antigen but negative for the toxin. She is likely colonized. Patient currently feeling better, she is being given by mouth medications including by mouth potassium and Phenergan. Stable for outpatient follow-up.   Earleen Newport, MD   Earleen Newport, MD 06/18/15 [phone]

## 2015-06-18 NOTE — Discharge Instructions (Signed)
Cyclic Vomiting Syndrome °Cyclic vomiting syndrome is a benign condition in which patients experience bouts or cycles of severe nausea and vomiting that last for hours or even days. The bouts of nausea and vomiting alternate with longer periods of no symptoms and generally good health. Cyclic vomiting syndrome occurs mostly in children, but can affect adults. °CAUSES  °CVS has no known cause. Each episode is typically similar to the previous ones. The episodes tend to:  °· Start at about the same time of day. °· Last the same length of time. °· Present the same symptoms at the same level of intensity. °Cyclic vomiting syndrome can begin at any age in children and adults. Cyclic vomiting syndrome usually starts between the ages of 3 and 7 years. In adults, episodes tend to occur less often than they do in children, but they last longer. Furthermore, the events or situations that trigger episodes in adults cannot always be pinpointed as easily as they can in children. °There are 4 phases of cyclic vomiting syndrome: °1. Prodrome. The prodrome phase signals that an episode of nausea and vomiting is about to begin. This phase can last from just a few minutes to several hours. This phase is often marked by belly (abdominal) pain. Sometimes taking medicine early in the prodrome phase can stop an episode in progress. However, sometimes there is no warning. A person may simply wake up in the middle of the night or early morning and begin vomiting. °2. Episode. The episode phase consists of: °· Severe vomiting. °· Nausea. °· Gagging (retching). °3. Recovery. The recovery phase begins when the nausea and vomiting stop. Healthy color, appetite, and energy return. °4. Symptom-free interval. The symptom-free interval phase is the period between episodes when no symptoms are present. °TRIGGERS °Episodes can be triggered by an infection or event. Examples of triggers include: °· Infections. °· Colds, allergies, sinus problems, and  the flu. °· Eating certain foods such as chocolate or cheese. °· Foods with monosodium glutamate (MSG) or preservatives. °· Fast foods. °· Pre-packaged foods. °· Foods with low nutritional value (junk foods). °· Overeating. °· Eating just before going to bed. °· Hot weather. °· Dehydration. °· Not enough sleep or poor sleep quality. °· Physical exhaustion. °· Menstruation. °· Motion sickness. °· Emotional stress (school or home difficulties). °· Excitement or stress. °SYMPTOMS  °The main symptoms of cyclic vomiting syndrome are: °· Severe vomiting. °· Nausea. °· Gagging (retching). °Episodes usually begin at night or the first thing in the morning. Episodes may include vomiting or retching up to 5 or 6 times an hour during the worst of the episode. Episodes usually last anywhere from 1 to 4 days. Episodes can last for up to 10 days. Other symptoms include: °· Paleness. °· Exhaustion. °· Listlessness. °· Abdominal pain. °· Loose stools or diarrhea. °Sometimes the nausea and vomiting are so severe that a person appears to be almost unconscious. Sensitivity to light, headache, fever, dizziness, may also accompany an episode. In addition, the vomiting may cause drooling and excessive thirst. Drinking water usually leads to more vomiting, though the water can dilute the acid in the vomit, making the episode a little less painful. Continuous vomiting can lead to dehydration, which means that the body has lost excessive water and salts. °DIAGNOSIS  °Cyclic vomiting syndrome is hard to diagnose because there are no clear tests to identify it. A caregiver must diagnose cyclic vomiting syndrome by looking at symptoms and medical history. A caregiver must exclude more common diseases   or disorders that can also cause nausea and vomiting. Also, diagnosis takes time because caregivers need to identify a pattern or cycle to the vomiting. °TREATMENT  °Cyclic vomiting syndrome cannot be cured. Treatment varies, but people with  cyclic vomiting syndrome should get plenty of rest and sleep and take medications that prevent, stop, or lessen the vomiting episodes and other symptoms. °People whose episodes are frequent and long-lasting may be treated during the symptom-free intervals in an effort to prevent or ease future episodes. The symptom-free phase is a good time to eliminate anything known to trigger an episode. For example, if episodes are brought on by stress or excitement, this period is the time to find ways to reduce stress and stay calm. If sinus problems or allergies cause episodes, those conditions should be treated. The triggers listed above should be avoided or prevented. °Because of the similarities between migraine and cyclic vomiting syndrome, caregivers treat some people with severe cyclic vomiting syndrome with drugs that are also used for migraine headaches. The drugs are designed to: °· Prevent episodes. °· Reduce their frequency. °· Lessen their severity. °HOME CARE INSTRUCTIONS °Once a vomiting episode begins, treatment is supportive. It helps to stay in bed and sleep in a dark, quiet room. Severe nausea and vomiting may require hospitalization and intravenous (IV) fluids to prevent dehydration. Relaxing medications (sedatives) may help if the nausea continues. Sometimes, during the prodrome phase, it is possible to stop an episode from happening altogether. Only take over-the-counter or prescription medicines for pain, discomfort or fever as directed by your caregiver. Do not give aspirin to children. °During the recovery phase, drinking water and replacing lost electrolytes (salts in the blood) are very important. Electrolytes are salts that the body needs to function well and stay healthy. Symptoms during the recovery phase can vary. Some people find that their appetites return to normal immediately, while others need to begin by drinking clear liquids and then move slowly to solid food. °RELATED COMPLICATIONS °The  severe vomiting that defines cyclic vomiting syndrome is a risk factor for several complications: °· Dehydration--Vomiting causes the body to lose water quickly. °· Electrolyte imbalance--Vomiting also causes the body to lose the important salts it needs to keep working properly. °· Peptic esophagitis--The tube that connects the mouth to the stomach (esophagus) becomes injured from the stomach acid that comes up with the vomit. °· Hematemesis--The esophagus becomes irritated and bleeds, so blood mixes with the vomit. °· Mallory-Weiss tear--The lower end of the esophagus may tear open or the stomach may bruise from vomiting or retching. °· Tooth decay--The acid in the vomit can hurt the teeth by corroding the tooth enamel. °SEEK MEDICAL CARE IF: °You have questions or problems. °Document Released: 11/17/2001 Document Revised: 12/01/2011 Document Reviewed: 12/16/2010 °[employer]® Patient Information ©2015 [employer]. This information is not intended to replace advice given to you by your health care provider. Make sure you discuss any questions you have with your health care provider. ° °

## 2015-06-25 IMAGING — DX DG CHEST 2V
2 series · 2 of 2 positions shown · non-contrast
Comparison: 05/30/2014

CLINICAL DATA: Shortness of Breath

EXAM:
CHEST  2 VIEW

[chest pa]
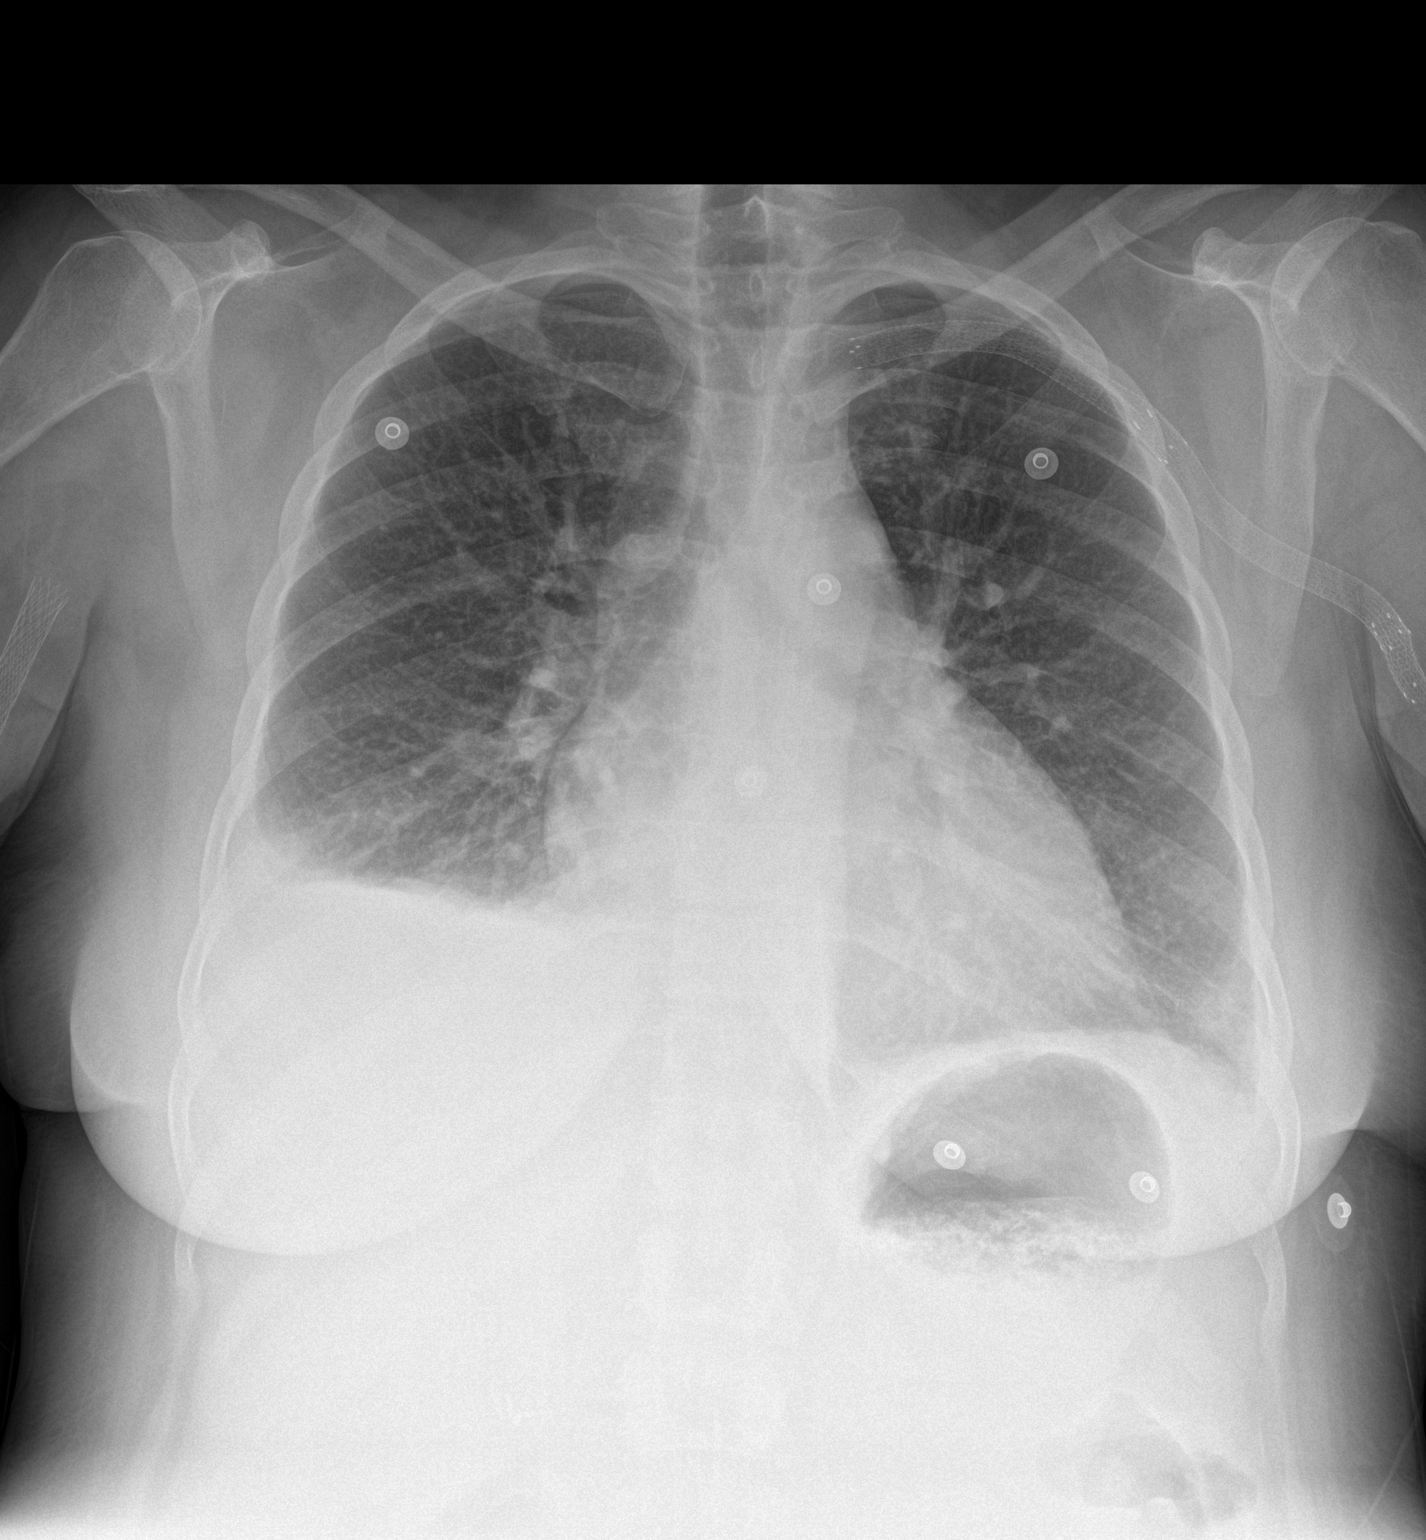

[chest lat]
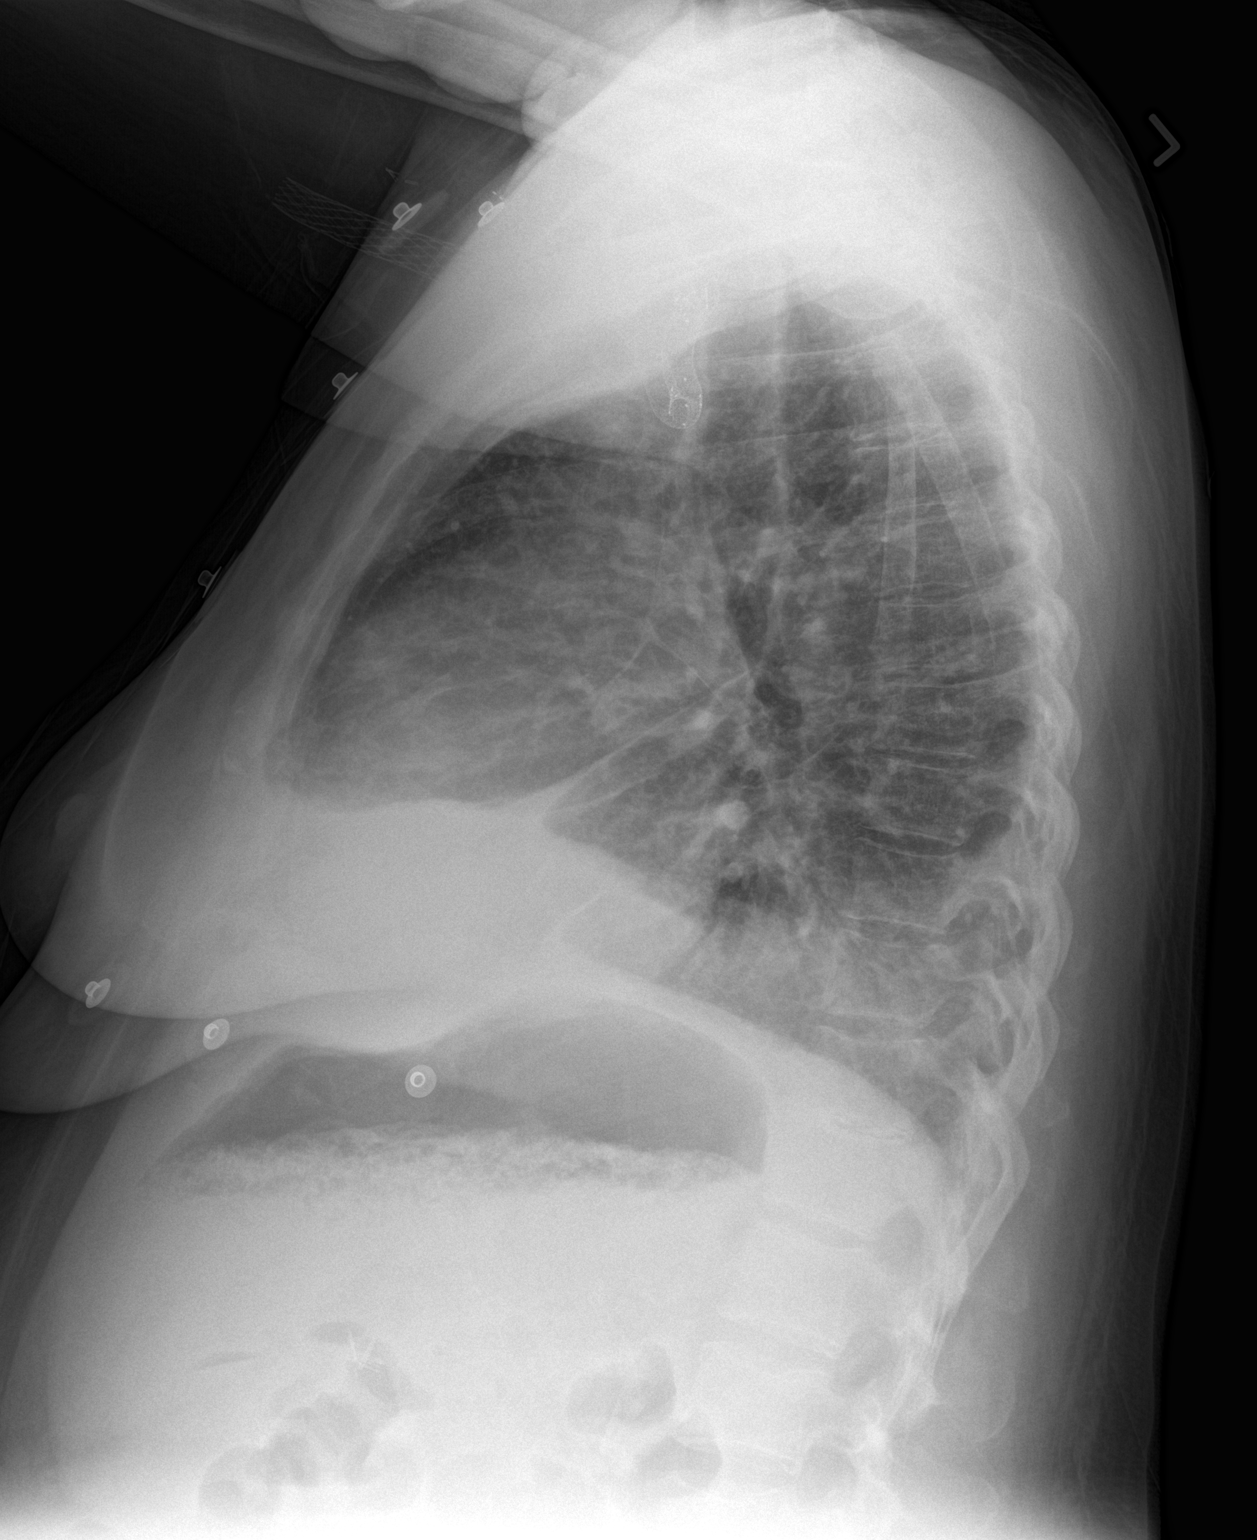

[2 of 2 positions shown; findings below may reference images not displayed]

FINDINGS: Cardiac shadow it is mildly enlarged but stable. Stenting is noted
throughout the left arm venous tree. Elevation of the right
hemidiaphragm is again seen. Early right basilar infiltrate is
identified predominately within the lower lobe. No other focal
abnormality is noted.
IMPRESSION: Early right basilar infiltrate.

## 2015-06-27 IMAGING — CR DG CHEST 1V PORT
1 series · 1 of 1 positions shown · non-contrast
Comparison: 02/17/2015

CLINICAL DATA: Dialysis patient.  Shortness of breath.

EXAM:
PORTABLE CHEST - 1 VIEW

[ap]
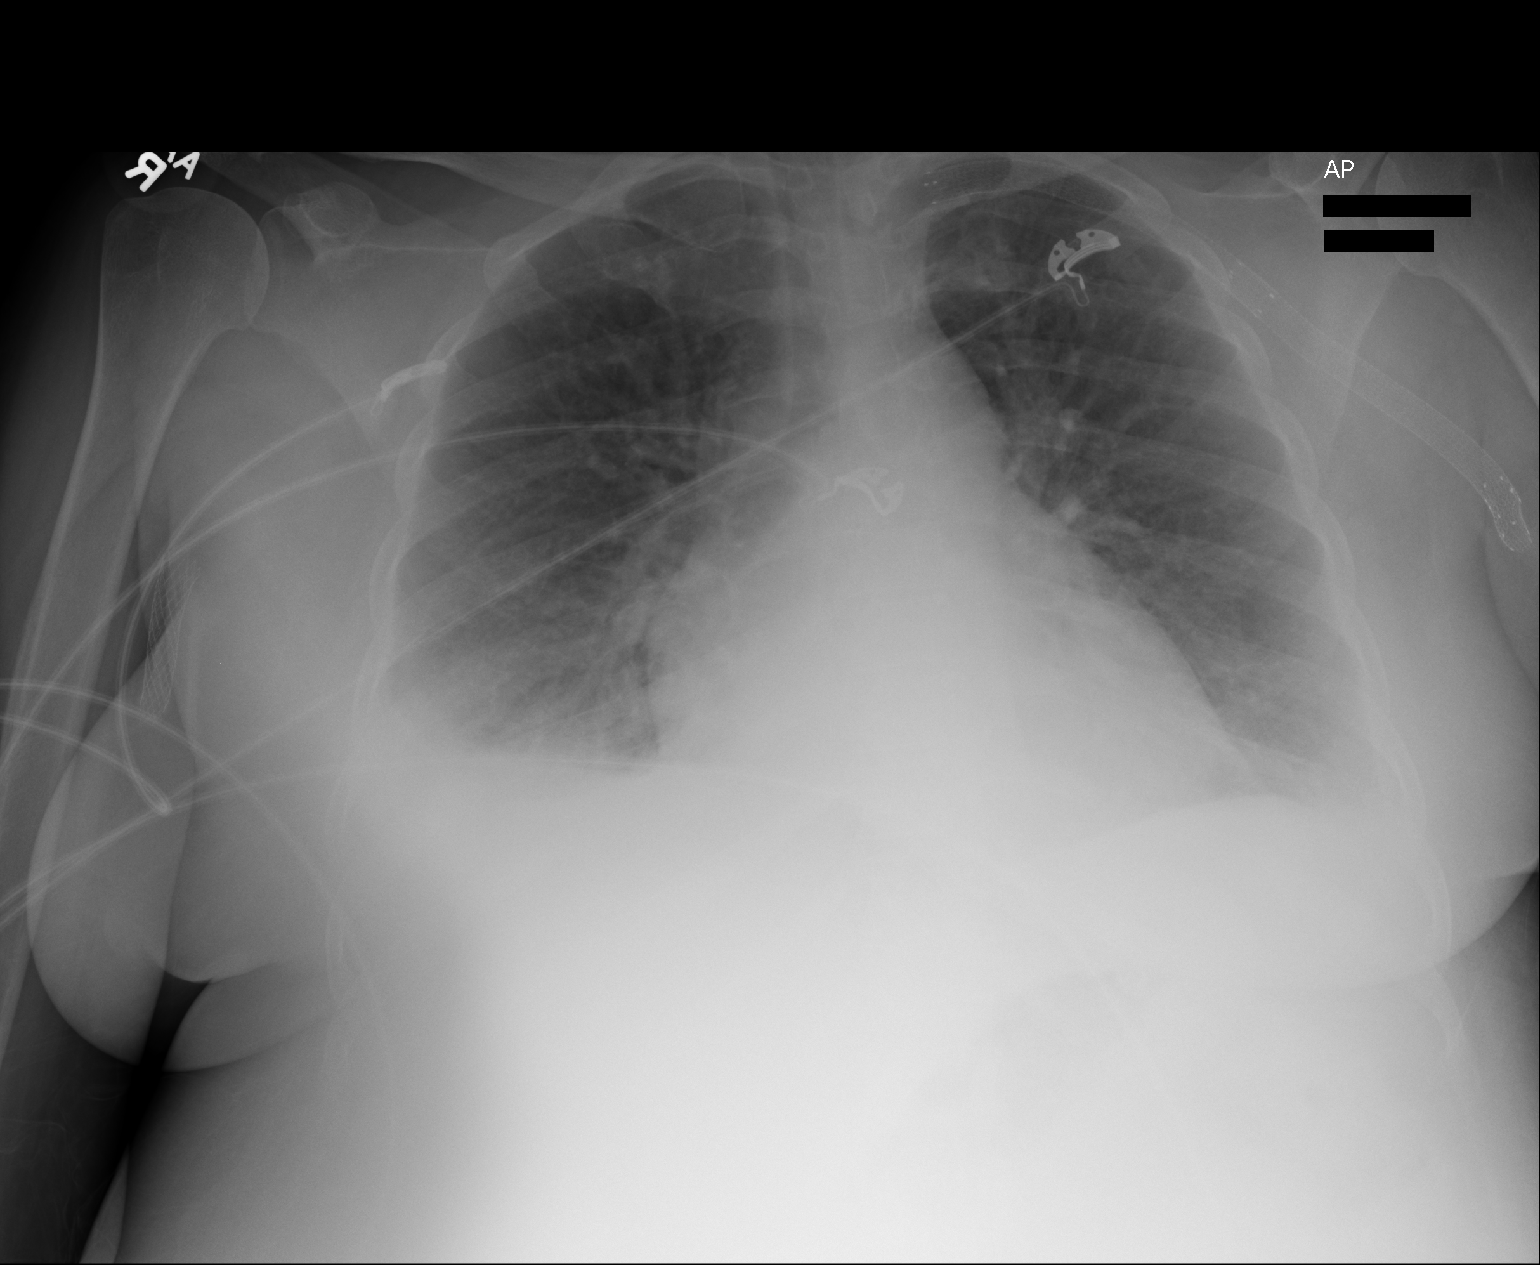

[1 of 1 positions shown; findings below may reference images not displayed]

FINDINGS: Normal heart size. There are small bilateral pleural effusions noted
right greater in left. These are mildly increased in volume from
previous exam. No airspace consolidation identified. The visualized
osseous structures are unremarkable.
IMPRESSION: Mild increase in volume of pleural effusions.  No active disease.

## 2015-06-27 IMAGING — DX DG CHEST 1V
1 series · 1 of 1 positions shown · non-contrast
Comparison: Frontal chest radiograph dated 02/19/2015 at 5522 hr

CLINICAL DATA: Shortness of breath, possible pneumonia

EXAM:
CHEST  1 VIEW

[chest lat]
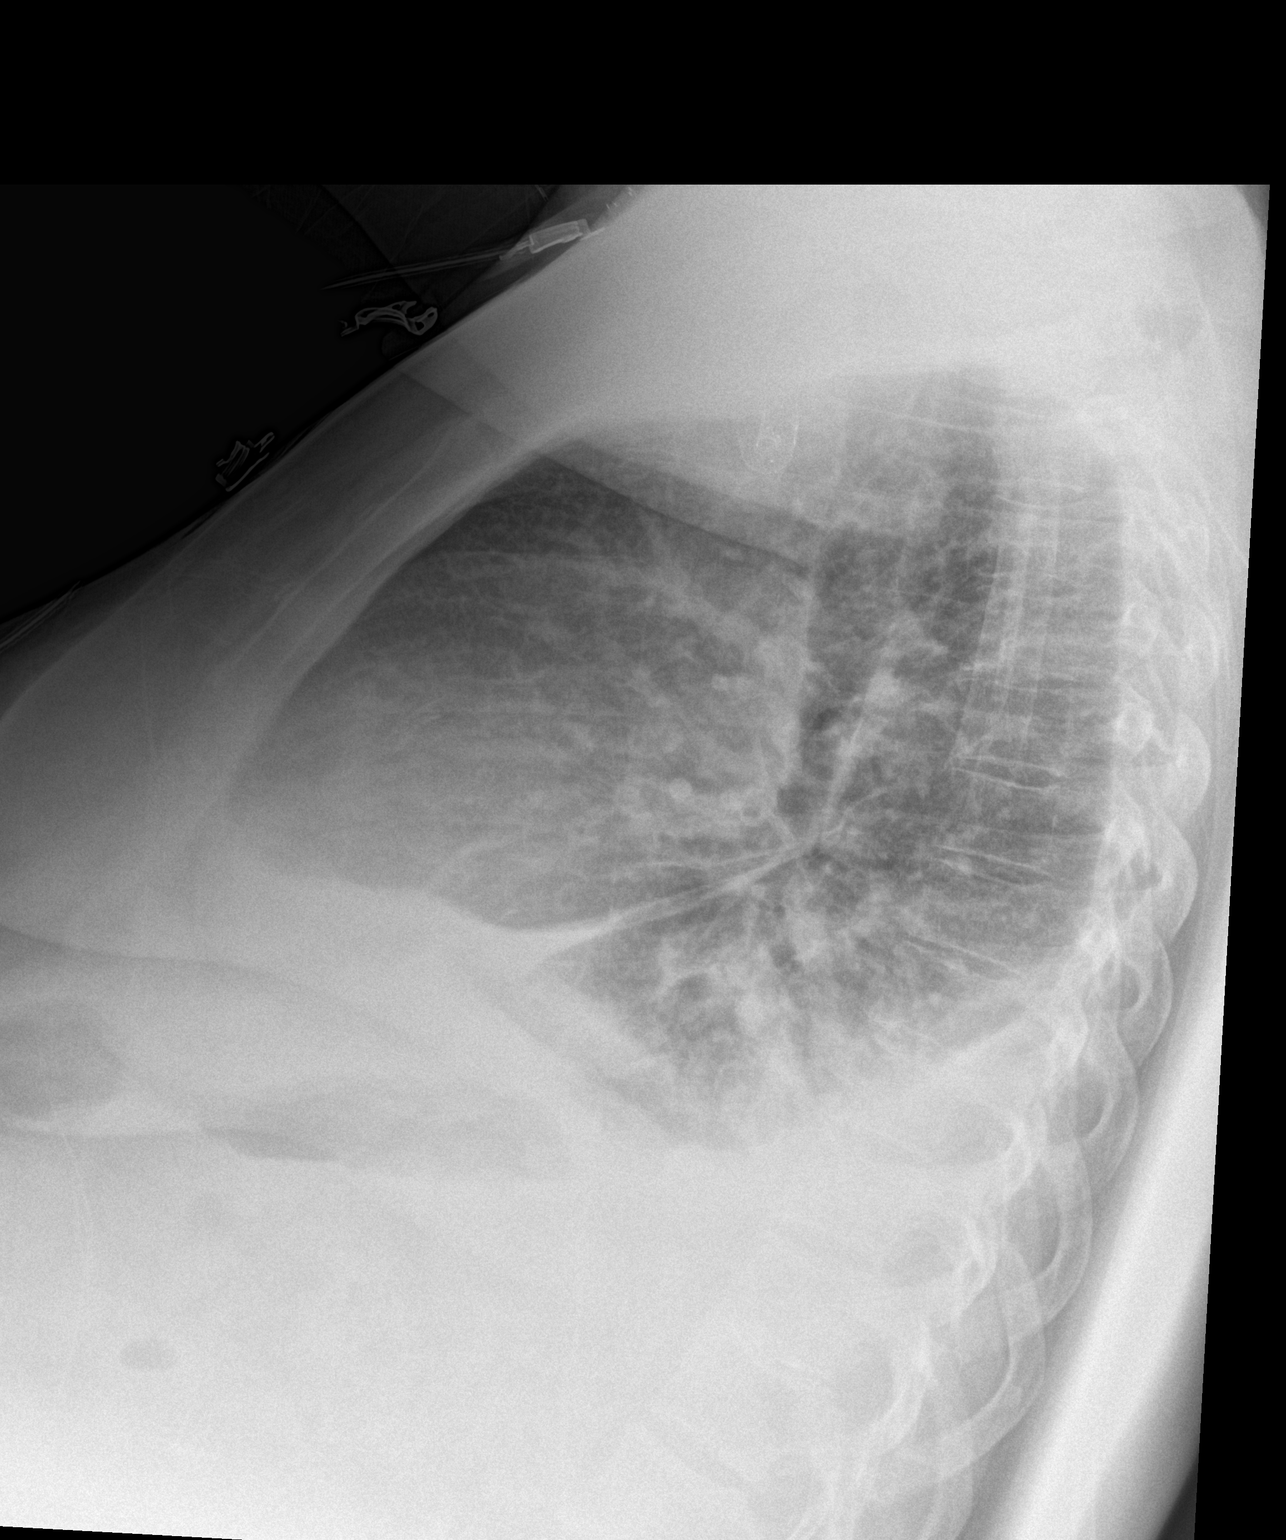

[1 of 1 positions shown; findings below may reference images not displayed]

FINDINGS: Layering pleural effusion, likely right greater than left when
correlating with the frontal radiograph.

Patchy lower lobe opacity, likely right lower lobe atelectasis when
corresponding with the frontal radiograph.
IMPRESSION: Right greater than left pleural effusions with right lower lobe
atelectasis.

## 2015-06-28 IMAGING — US US THORACENTESIS ASP PLEURAL SPACE W/IMG GUIDE
1 series · 2 of 2 positions shown · non-contrast
Comparison: None.

CLINICAL DATA: Right pleural effusion

EXAM:
ULTRASOUND GUIDED RIGHT THORACENTESIS

[Series 1: us thoracentesis asp pleural space w/img guide · 0.31mm/px · 2 of 2 slices shown]
[im 1/2]
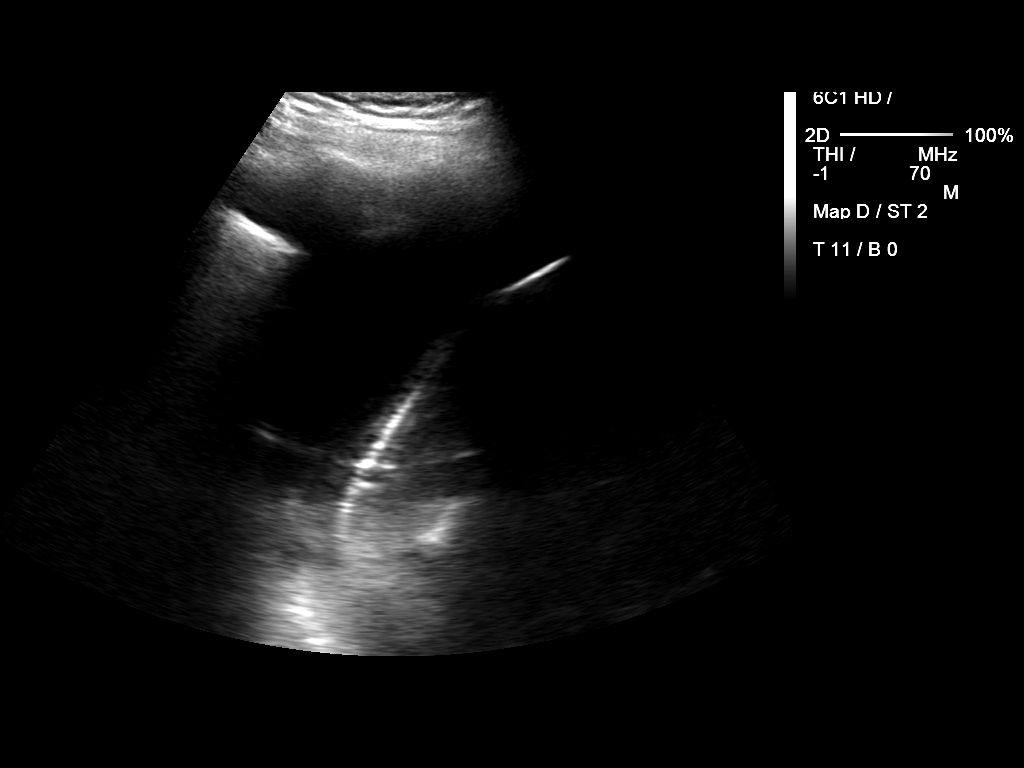
[im 2/2]
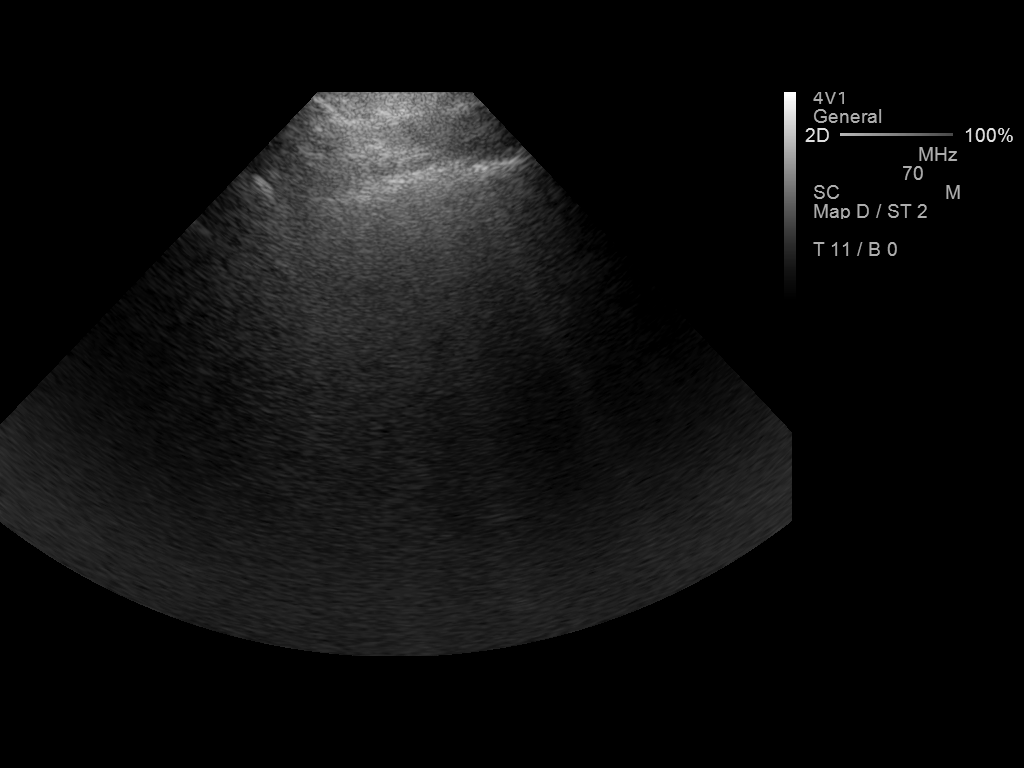

[2 of 2 positions shown; findings below may reference images not displayed]

PROCEDURE:
An ultrasound guided thoracentesis was thoroughly discussed with the
patient and questions answered. The benefits, risks, alternatives
and complications were also discussed. The patient understands and
wishes to proceed with the procedure. Written consent was obtained.

Ultrasound was performed to localize and mark an adequate pocket of
fluid in the right chest. The area was then prepped and draped in
the normal sterile fashion. 1% Lidocaine was used for local
anesthesia. Under ultrasound guidance a Safe-T-Centesis catheter was
introduced. Thoracentesis was performed. The catheter was removed
and a dressing applied.

COMPLICATIONS:
None.
FINDINGS: A total of approximately 850 cc of clear fluid was removed. A fluid
sample wassent for laboratory analysis.
IMPRESSION: Successful ultrasound guided right thoracentesis yielding 850 cc of
pleural fluid.

## 2015-06-28 IMAGING — CR DG CHEST 1V
1 series · 1 of 1 positions shown · non-contrast
Comparison: Chest radiograph 02/19/2015

CLINICAL DATA: Status post right-sided thoracentesis.

EXAM:
CHEST  1 VIEW

[x chest ap]
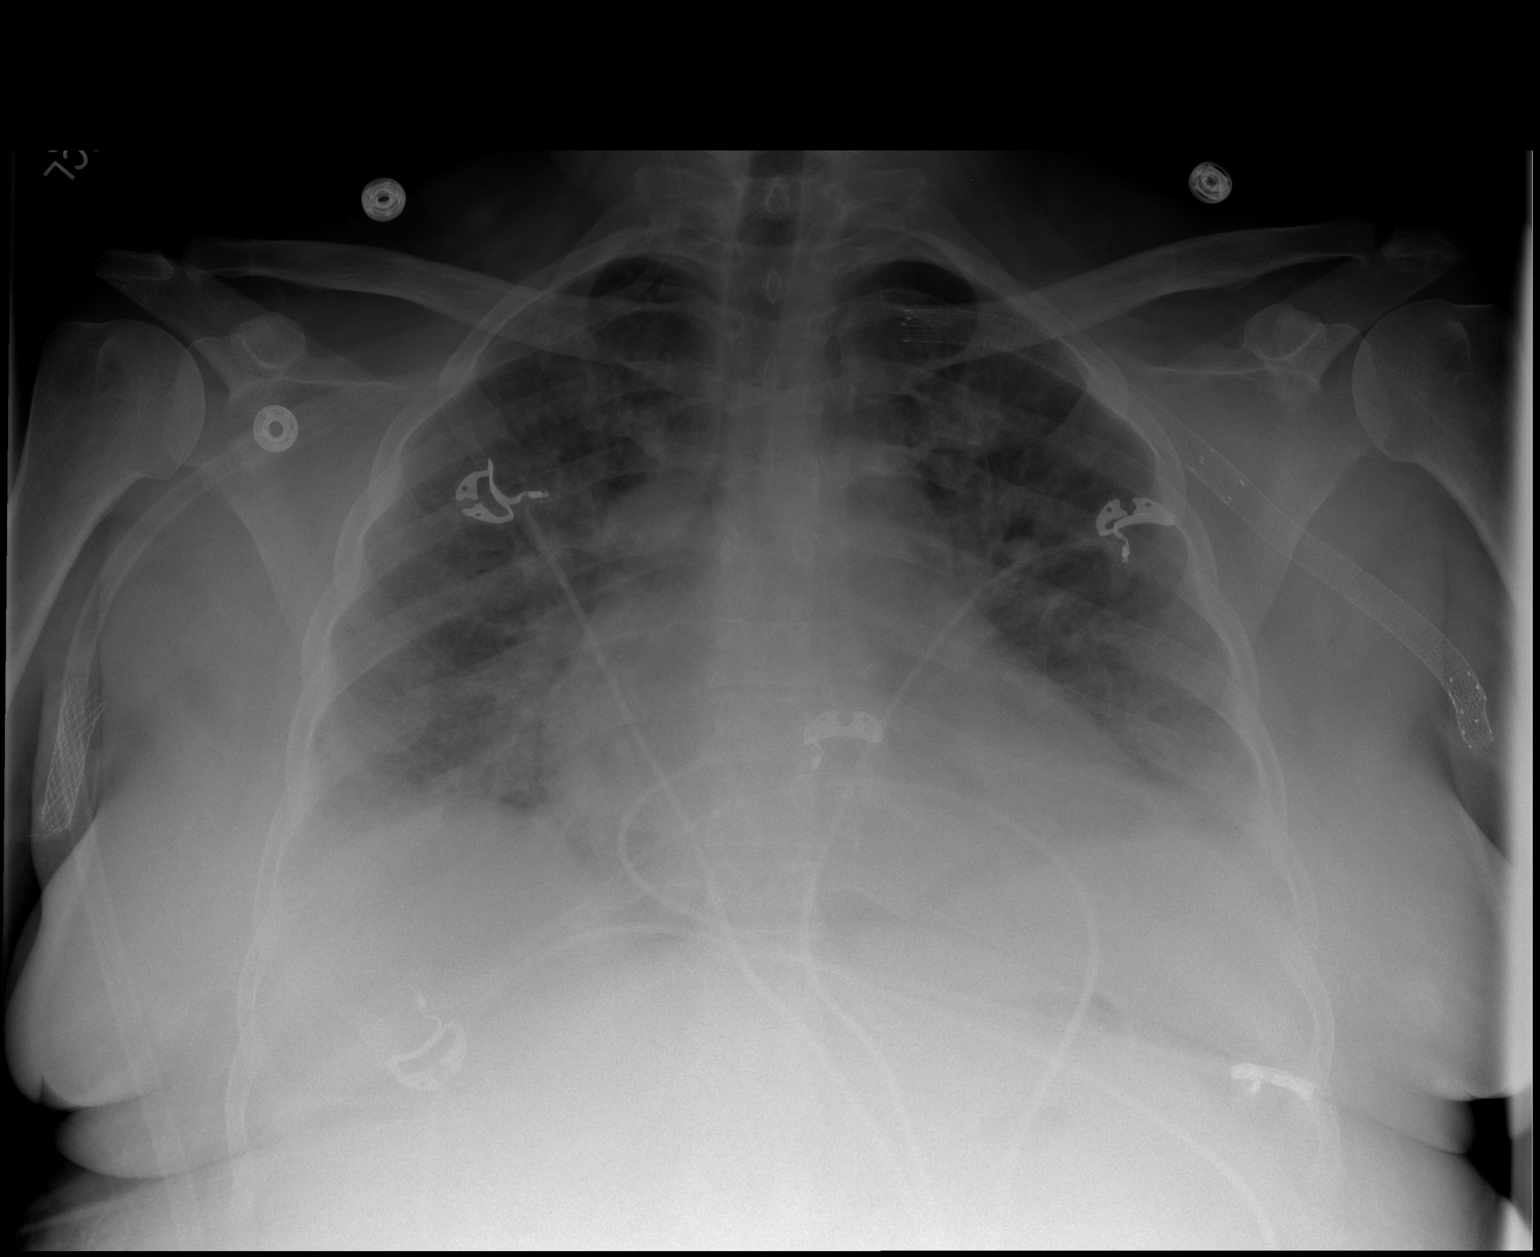

[1 of 1 positions shown; findings below may reference images not displayed]

FINDINGS: Interval decrease in size of the right pleural effusion, status post
right thoracentesis today. No visible pneumothorax. Vascular stent
in the left subclavian and left axillary region. Vascular stent in
the right axillary region.

Stable heart mediastinal contours. Heart size appears mildly
enlarged for portable technique.

There are mild hazy opacities throughout both lungs. Cardiac leads
project of the chest.
IMPRESSION: 1. Negative for pneumothorax.
2. Mild hazy opacities throughout both lungs. Cannot exclude mild
symmetric airspace disease.

## 2015-06-29 IMAGING — US US ABDOMEN LIMITED
1 series · 8 of 8 positions shown · non-contrast
Comparison: 02/21/2015.

CLINICAL DATA: Ascites.

EXAM:
LIMITED ABDOMEN ULTRASOUND FOR ASCITES
TECHNIQUE: Limited ultrasound survey for ascites was performed in all four
abdominal quadrants.

[Series 1: us abdomen limited · 0.31mm/px · 8 of 8 slices shown]
[im 1/8]
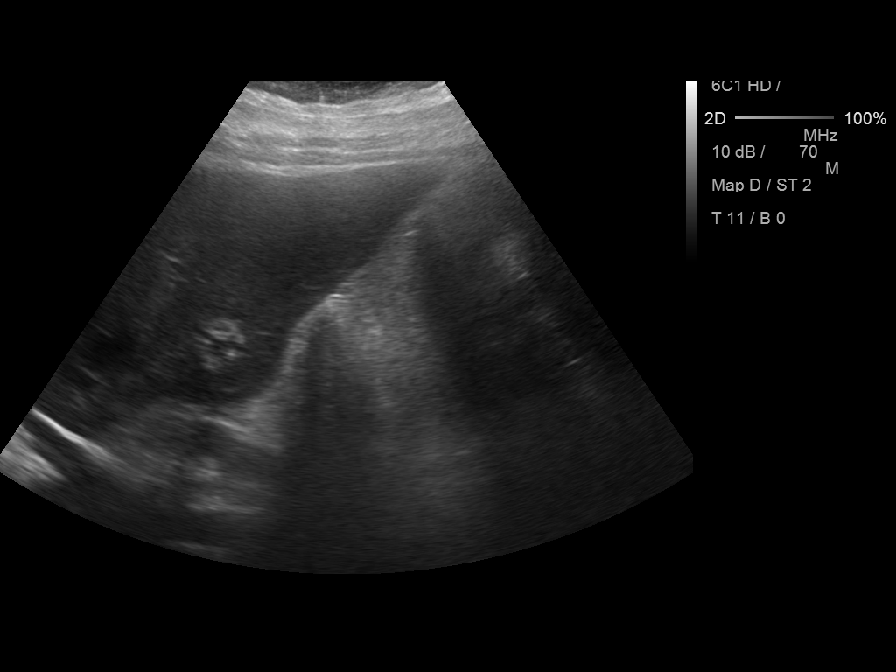
[im 2/8]
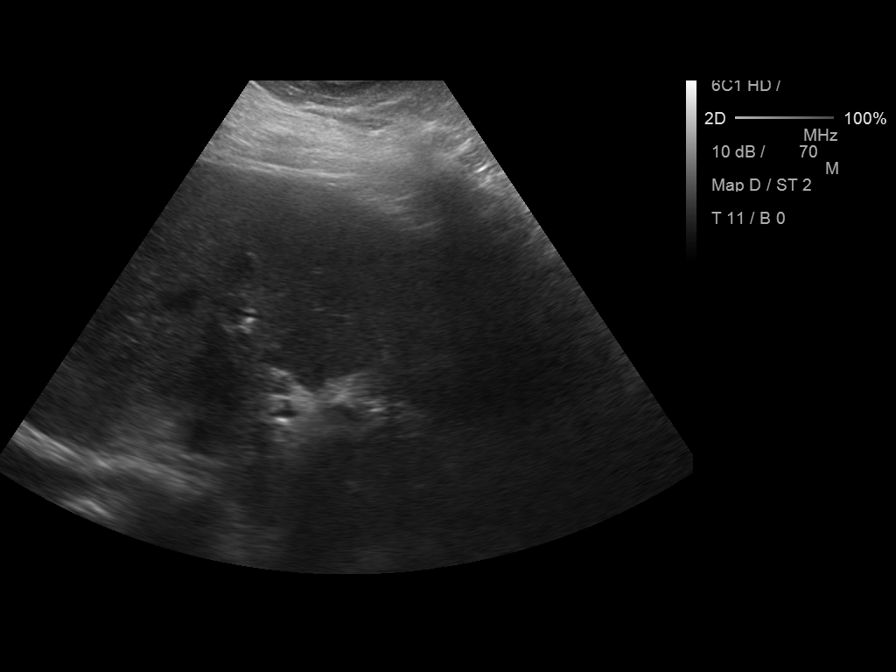
[im 3/8]
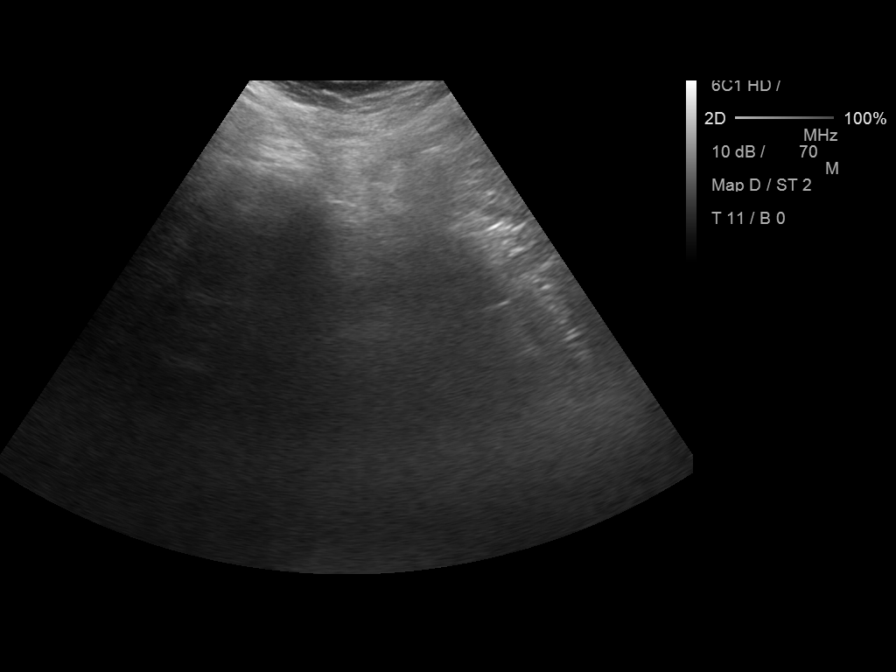
[im 4/8]
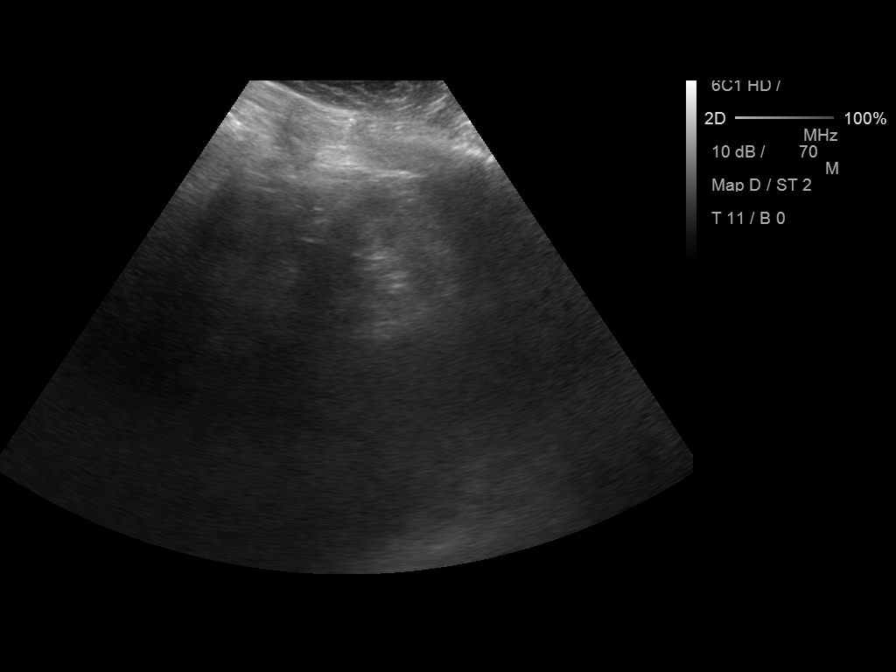
[im 5/8]
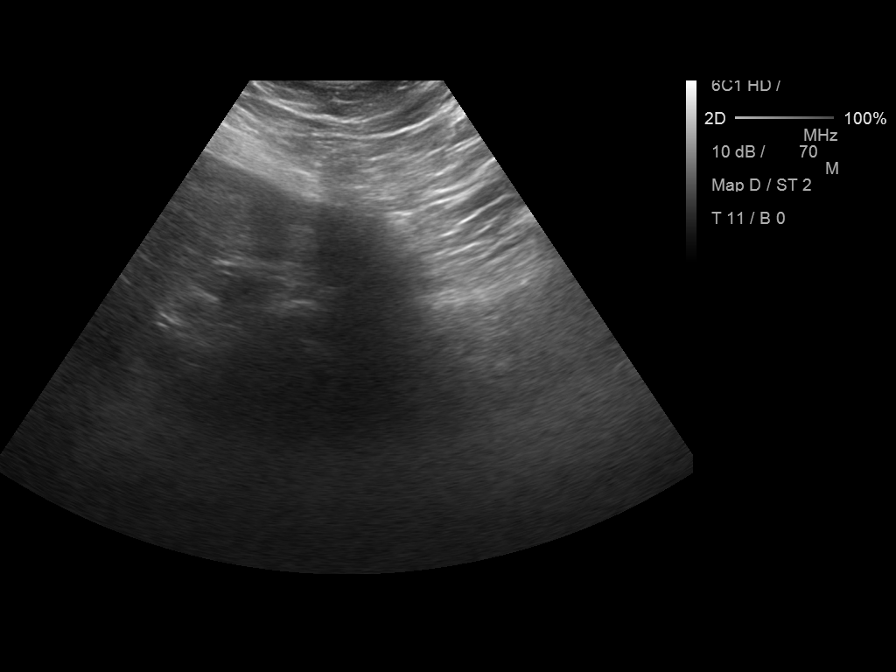
[im 6/8]
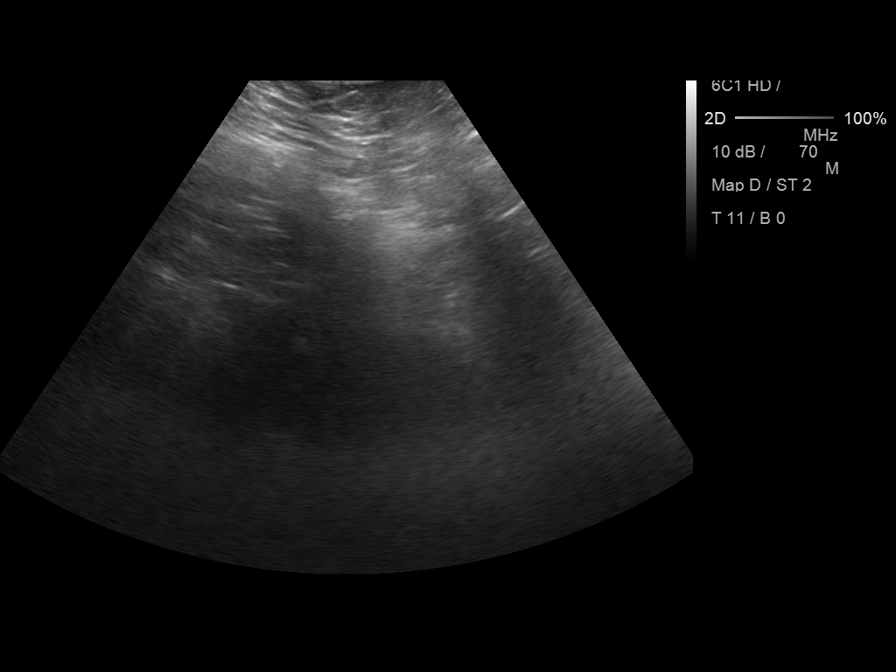
[im 7/8]
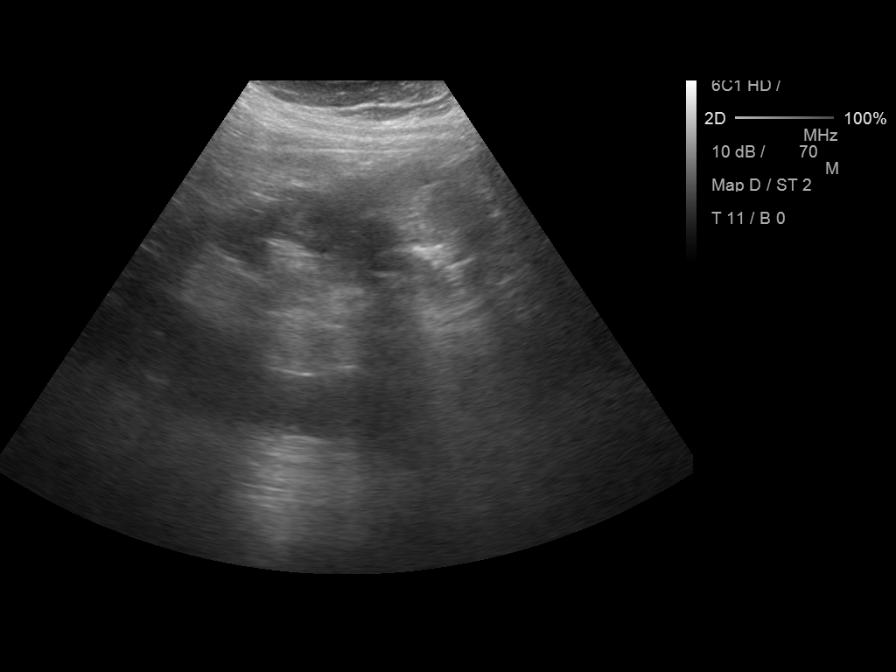
[im 8/8]
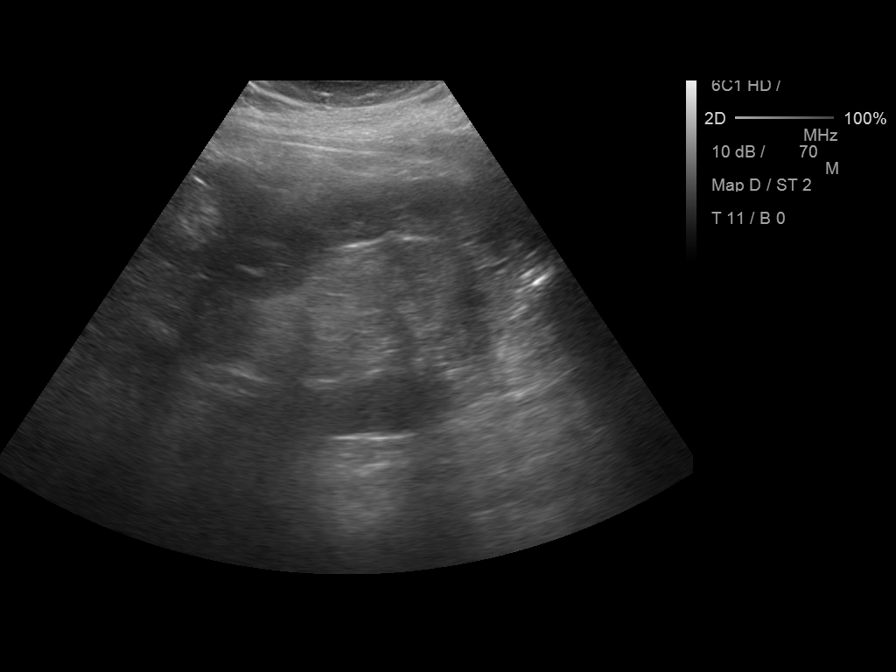

[8 of 8 positions shown; findings below may reference images not displayed]

FINDINGS: There is no ascites identified in all 4 quadrants evaluated.
IMPRESSION: Negative for ascites.

## 2015-06-29 IMAGING — CR DG CHEST 1V
1 series · 1 of 1 positions shown · non-contrast
Comparison: 02/20/2015

CLINICAL DATA: Thoracentesis

EXAM:
CHEST  1 VIEW

[dg chest 1 view]
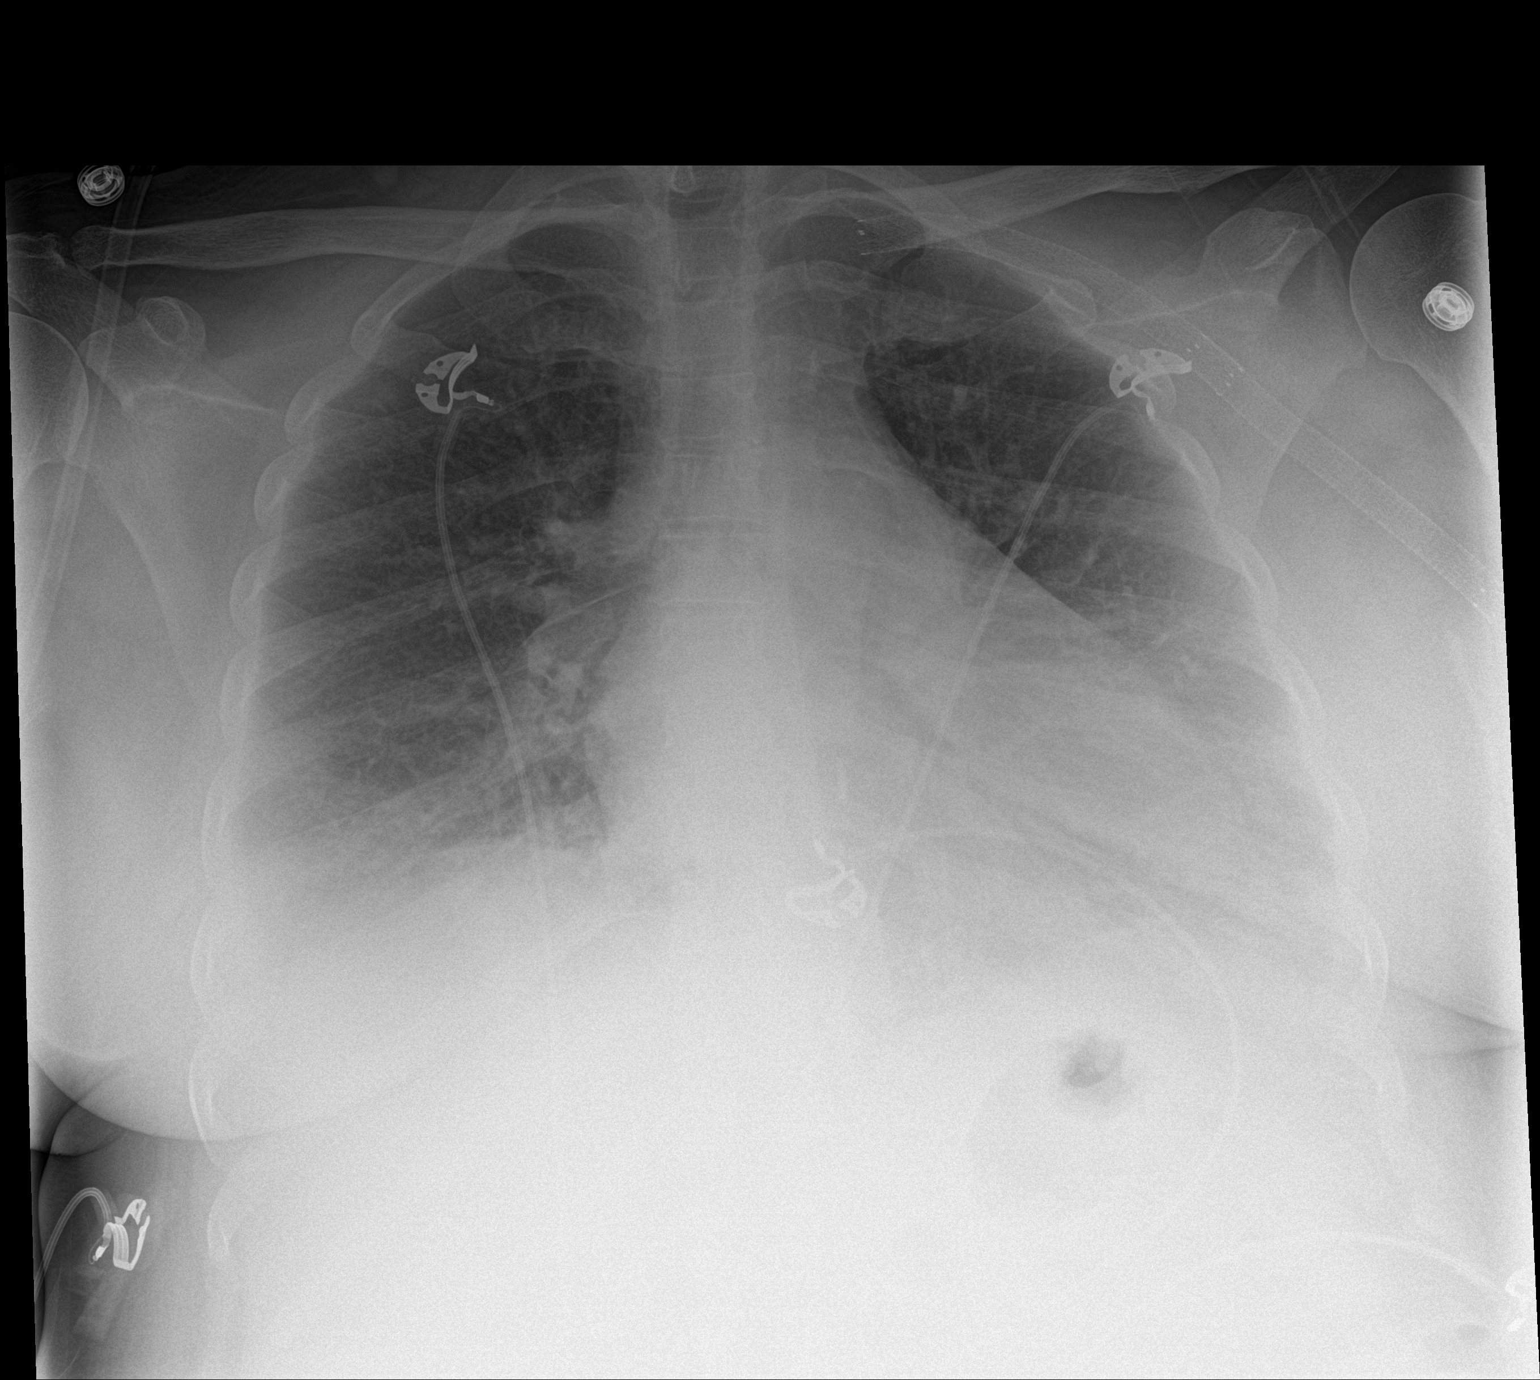

[1 of 1 positions shown; findings below may reference images not displayed]

FINDINGS: There is no evidence of pneumothorax. Cardiomegaly is stable.
Bibasilar opacity is stable.
IMPRESSION: No pneumothorax post thoracentesis.  Persistent bibasilar opacity.

## 2015-06-29 IMAGING — US US THORACENTESIS ASP PLEURAL SPACE W/IMG GUIDE
1 series · 4 of 4 positions shown · non-contrast
Comparison: Chest radiograph 02/20/2015

CLINICAL DATA: Shortness of breath.

EXAM:
ULTRASOUND GUIDED LEFT THORACENTESIS

[Series 1: us thoracentesis asp pleural space w/img guide · 0.30mm/px · 4 of 4 slices shown]
[im 1/4]
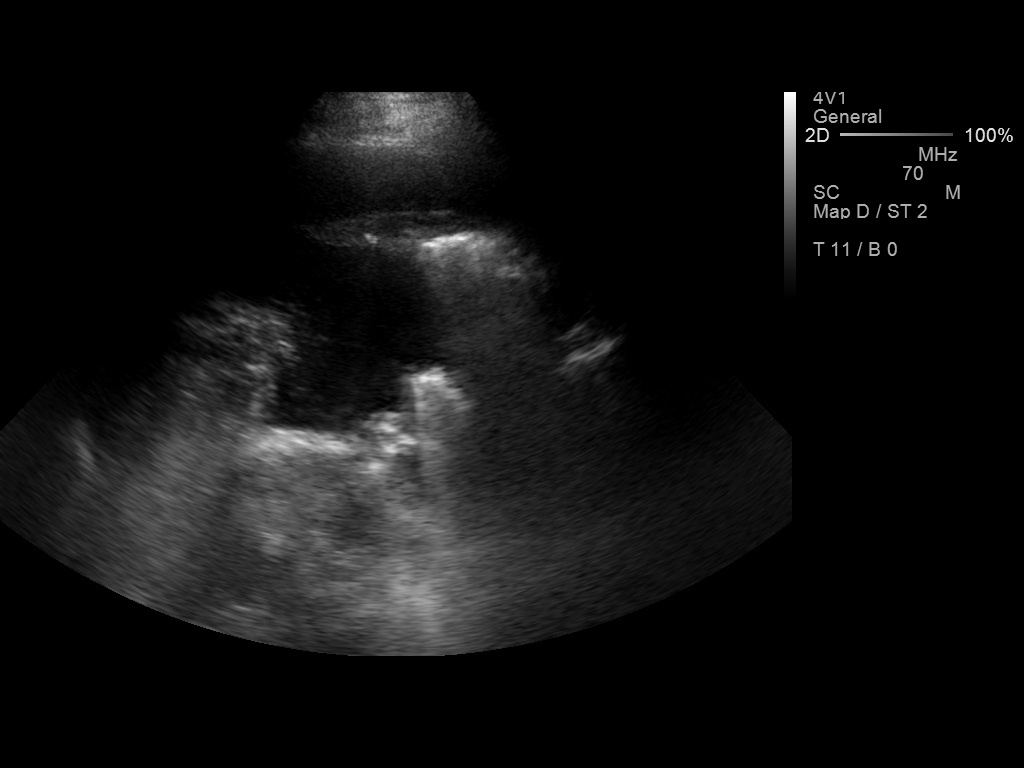
[im 2/4]
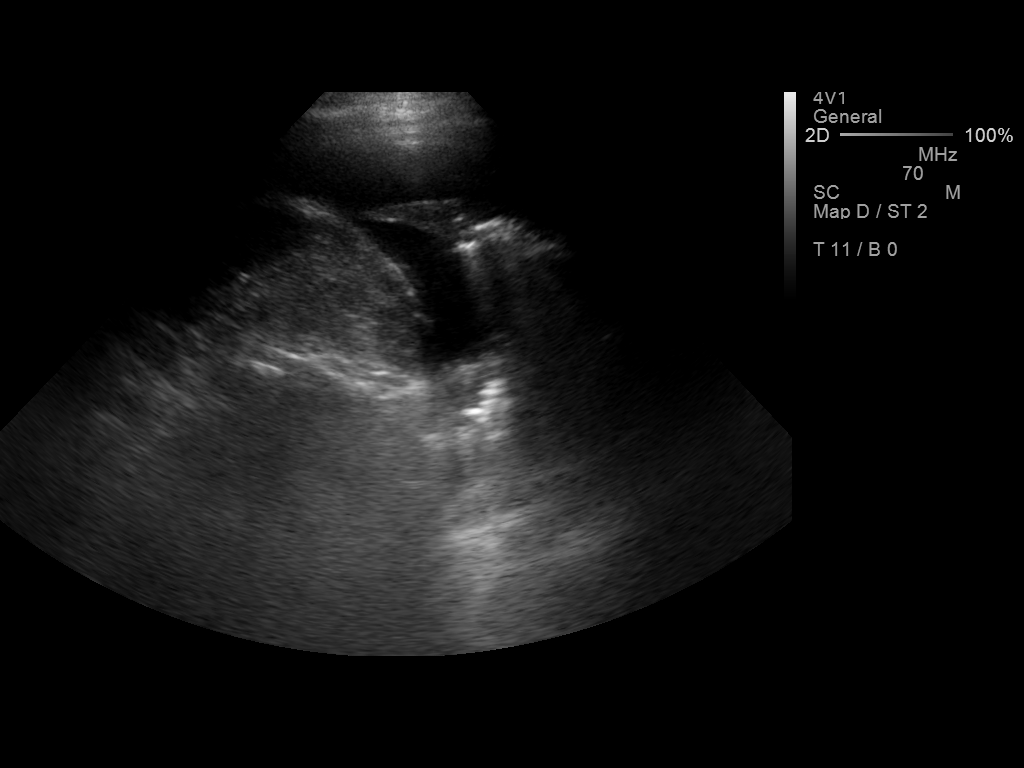
[im 3/4]
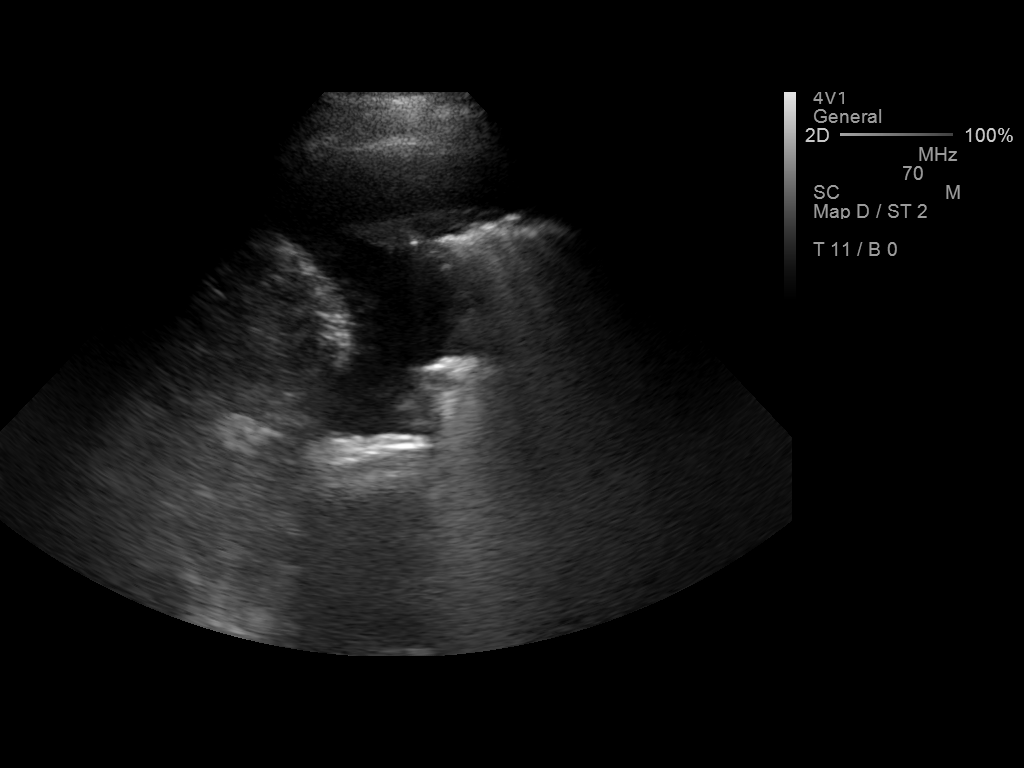
[im 4/4]
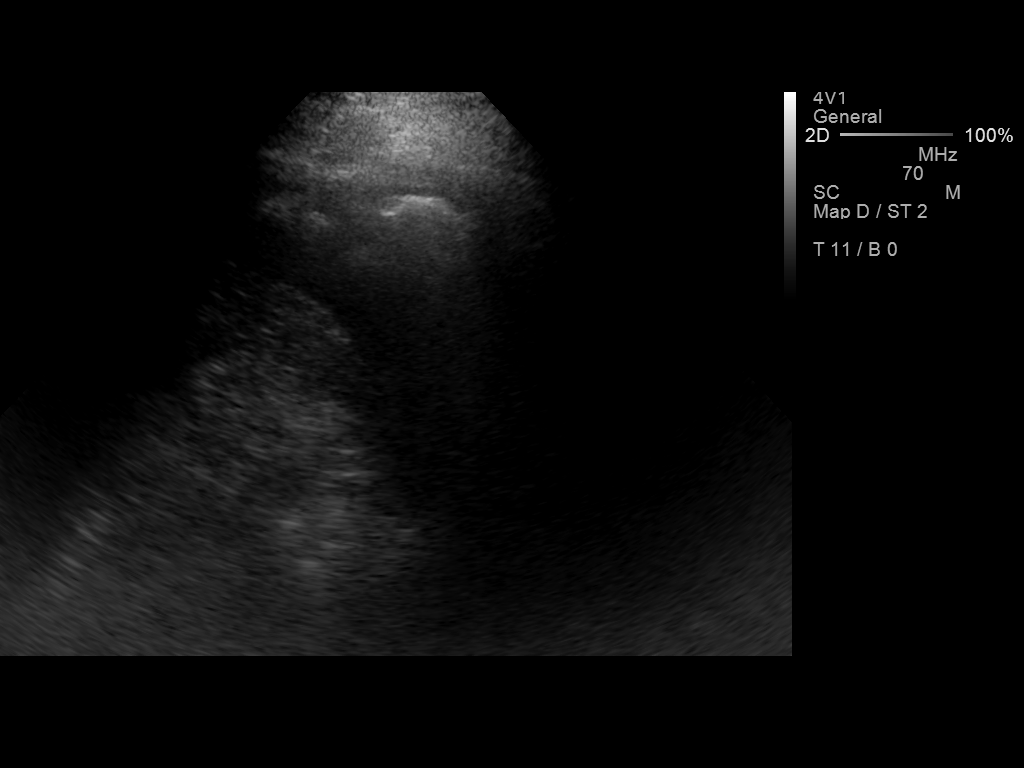

[4 of 4 positions shown; findings below may reference images not displayed]

PROCEDURE:
An ultrasound guided thoracentesis was thoroughly discussed with the
patient and questions answered. The benefits, risks, alternatives
and complications were also discussed. The patient understands and
wishes to proceed with the procedure. Written consent was obtained.

Ultrasound was performed to localize and mark an adequate pocket of
fluid in the left posterior chest. The area was then prepped and
draped in the normal sterile fashion. 1% Lidocaine was used for
local anesthesia. Under ultrasound guidance a 19 gauge Yueh catheter
was introduced. Thoracentesis was performed. The catheter was
removed and a dressing applied.

COMPLICATIONS:
None.
FINDINGS: A total of approximately 400 mL of amber color fluid was removed. A
fluid sample was sent for laboratory analysis.
IMPRESSION: Successful ultrasound guided left thoracentesis yielding 400 mL of
pleural fluid.

## 2015-06-30 IMAGING — CT CT ABD-PELV W/ CM
1 of 3 series · 13 of 32 positions shown, 18 images · IV contrast (omnipaque)
Comparison: 03/31/2013

CLINICAL DATA: Generalized abdominal pain, nausea and vomiting,
history of pancreatitis

EXAM:
CT ABDOMEN AND PELVIS WITH CONTRAST
TECHNIQUE: Multidetector CT imaging of the abdomen and pelvis was performed
using the standard protocol following bolus administration of
intravenous contrast.
CONTRAST:  100mL OMNIPAQUE IOHEXOL 300 MG/ML  SOLN

[Series 2: routine abd pel with · axial · 0.86mm/px · z∈[-913,-503]mm · 13 of 92 slices shown, 18 images]
[im 5/92  soft-tissue]
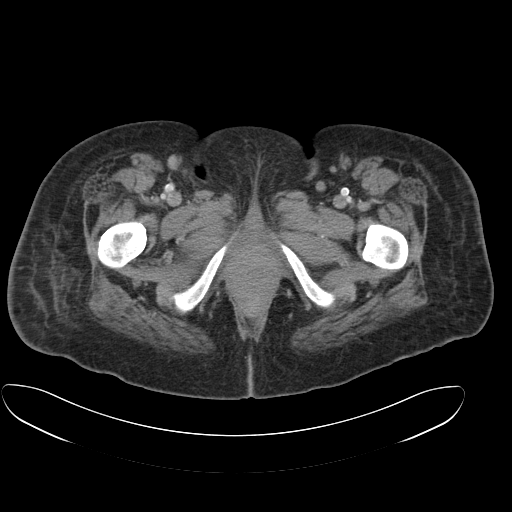
[im 5/92  bone]
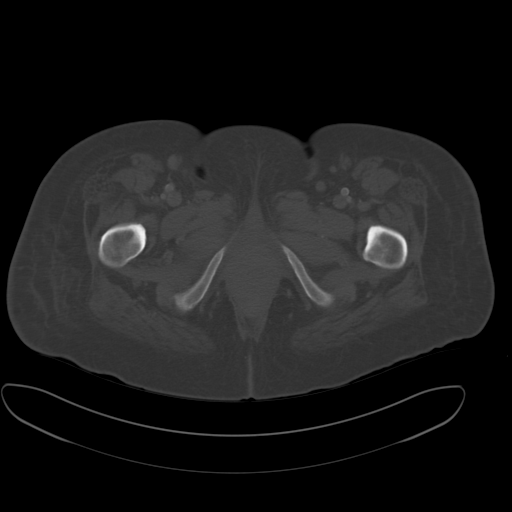
[im 14/92  soft-tissue]
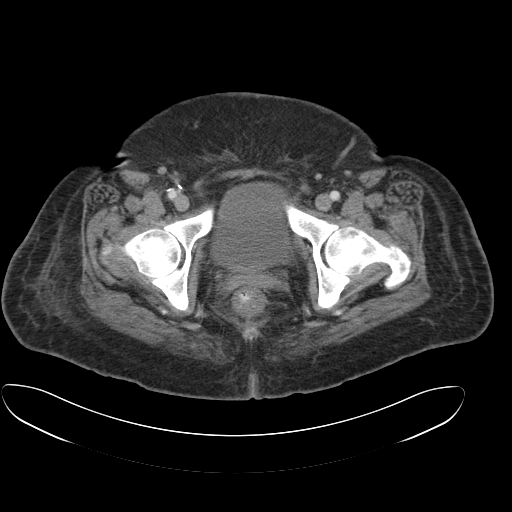
[im 19/92  soft-tissue]
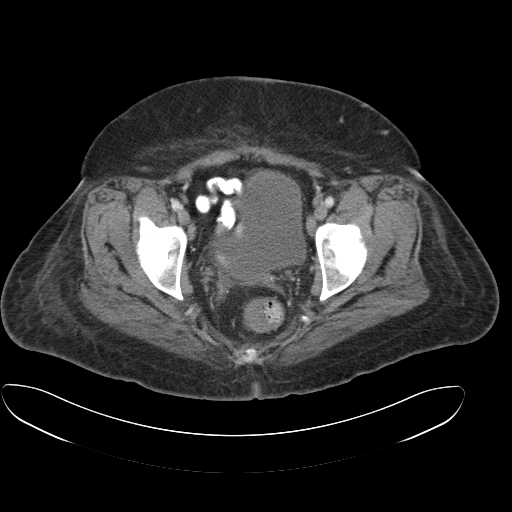
[im 28/92  soft-tissue]
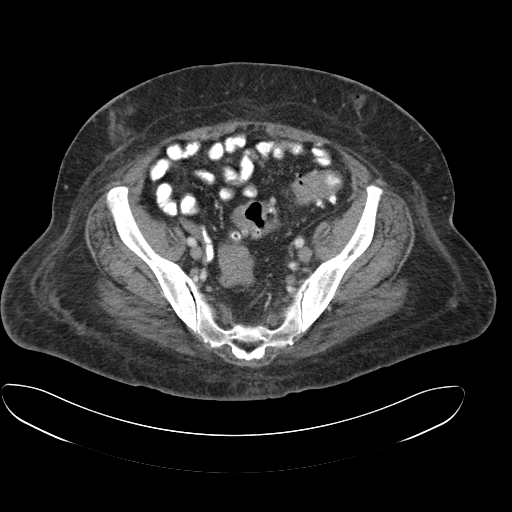
[im 37/92  soft-tissue]
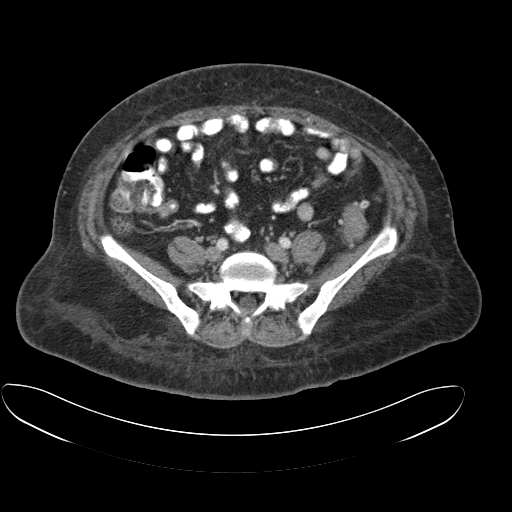
[im 41/92  soft-tissue]
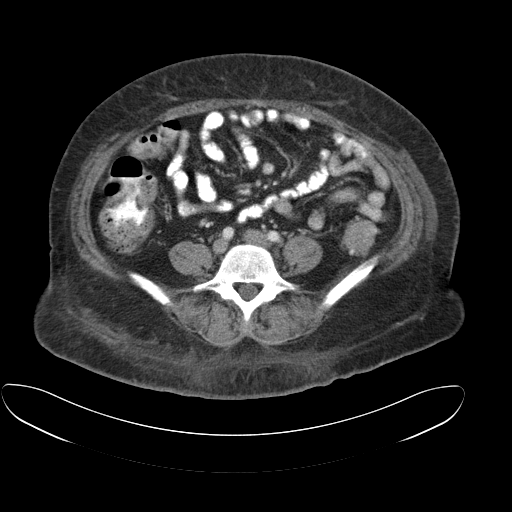
[im 51/92  soft-tissue]
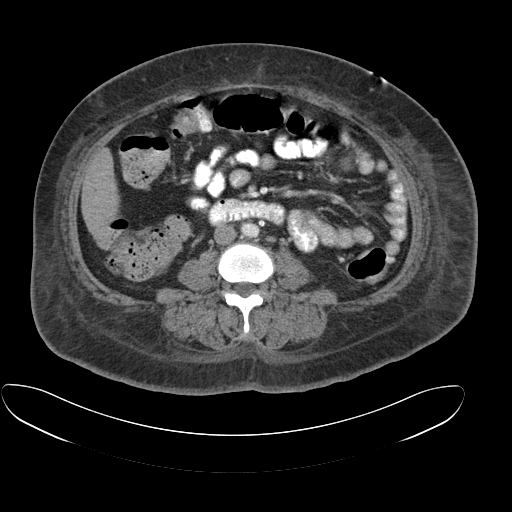
[im 55/92  soft-tissue]
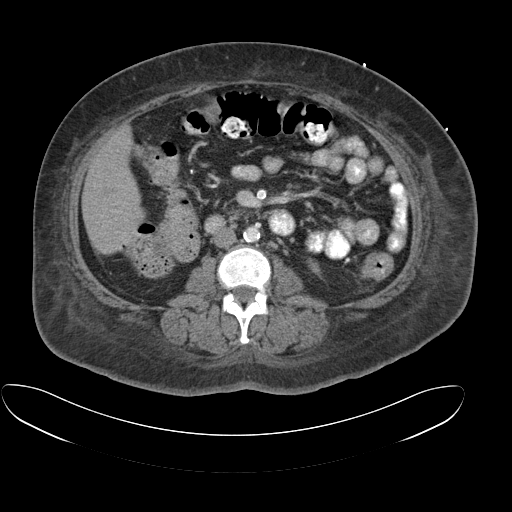
[im 64/92  soft-tissue]
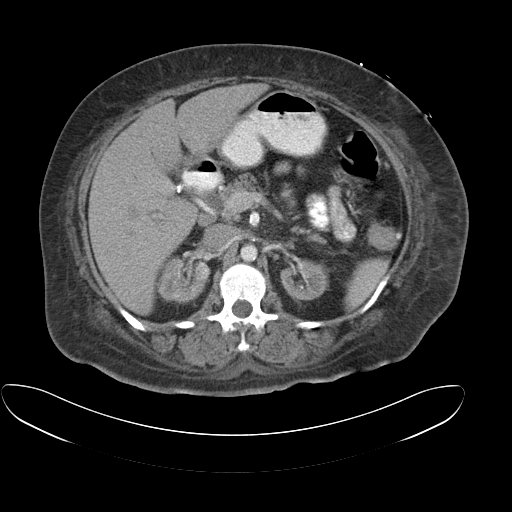
[im 64/92  bone]
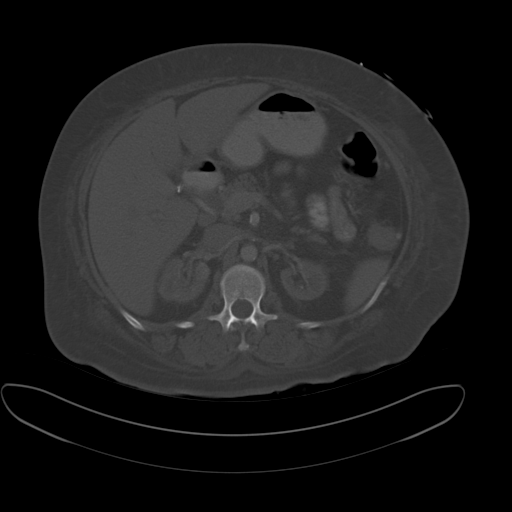
[im 73/92  soft-tissue]
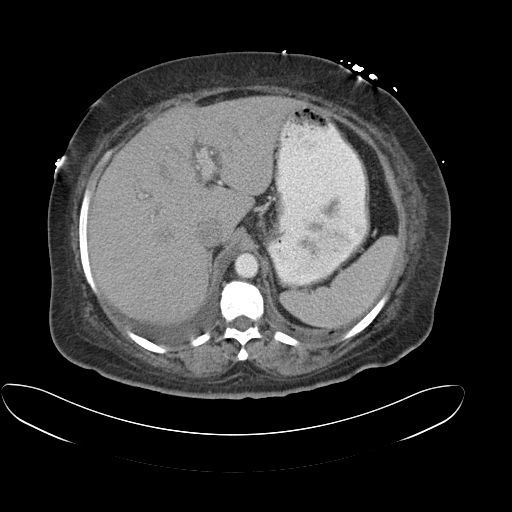
[im 73/92  lung]
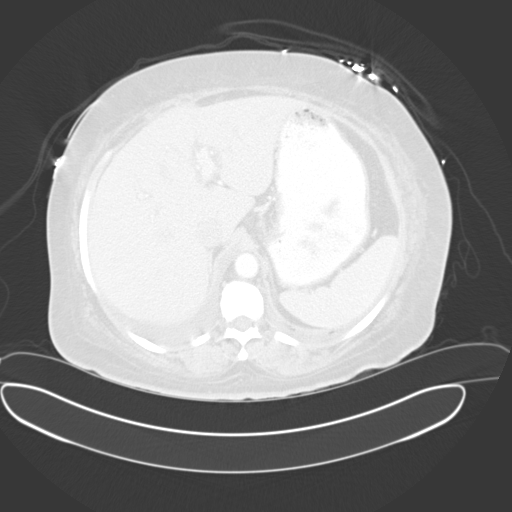
[im 78/92  soft-tissue]
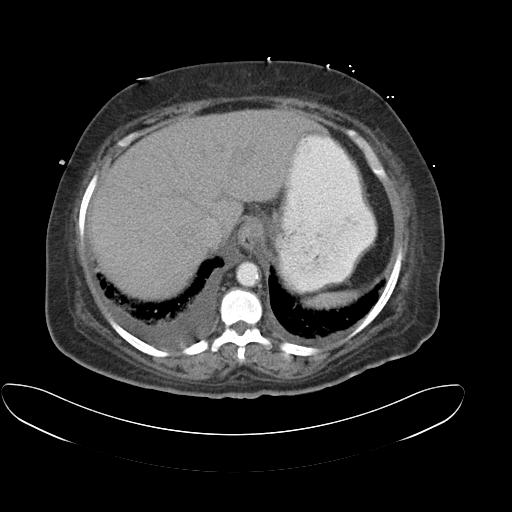
[im 78/92  lung]
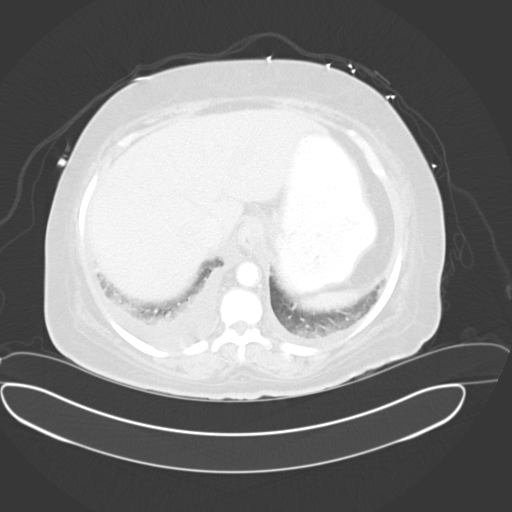
[im 82/92  lung]
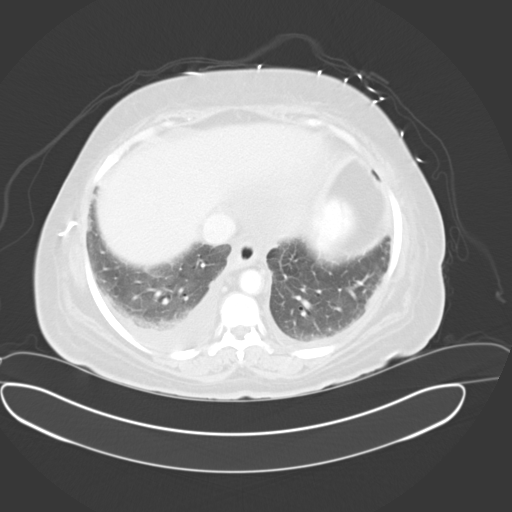
[im 87/92  soft-tissue]
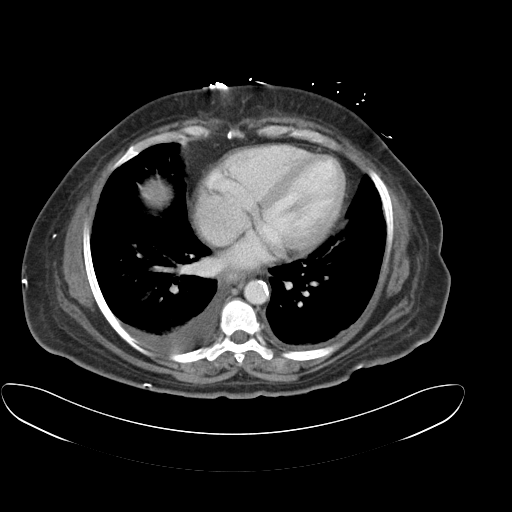
[im 87/92  lung]
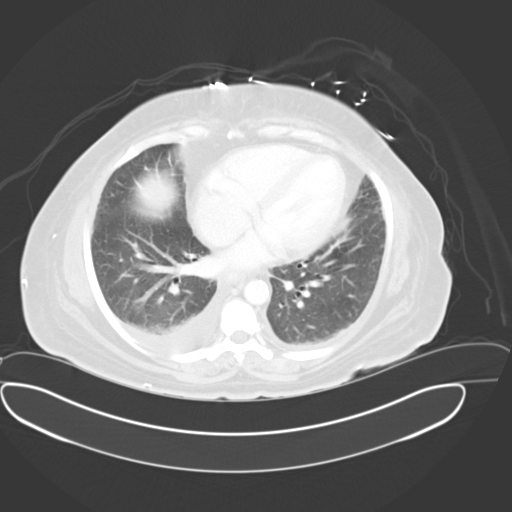

[13 of 32 positions shown; findings below may reference images not displayed]

FINDINGS: Sagittal images of the spine shows mild degenerative changes lower
thoracic spine. There is mild anasarca infiltration of subcutaneous
fat abdominal and pelvic wall.

Small right pleural effusion. Trace left pleural effusion. Mild
basilar posterior atelectasis. Enhanced liver shows no focal mass.
The patient is status postcholecystectomy. The pancreas and spleen
is unremarkable. Adrenal glands are unremarkable. Again noted renal
atrophy bilaterally. No hydronephrosis or hydroureter. There is
global mild decreased enhancement bilateral kidneys. There is delay
excretion bilateral kidneys. Findings surgery decrease renal
function bilaterally. Clinical correlation is necessary.

No aortic aneurysm.  Small hiatal hernia.

Atherosclerotic calcifications of abdominal aorta and SMA.

Moderate stool noted in hepatic flexure of the colon. There is no
pericecal inflammation.

Scattered diverticula are noted descending colon and sigmoid colon.
There is minimal thickening of colonic wall in splenic flexure of
the colon. Minimal colitis cannot be excluded. Mild thickening of
colonic wall in sigmoid colon and rectum. Mild colitis cannot be
excluded. There is no mesenteric or pericolonic abscess. No
diverticular abscess is noted.

The patient is status post hysterectomy. The patient is status post
appendectomy. A right inguinal lymph node measures 2.2 x 1.5 cm.
Left inguinal lymph node measures 2.3 by 1.3 cm.
IMPRESSION: 1. There is small right pleural effusion. Trace left pleural
effusion. Mild bilateral basilar posterior atelectasis.
2. Status postcholecystectomy.
3. Again noted bilateral renal atrophy. No hydronephrosis or
hydroureter.
4. Minimal thickening of colonic wall in splenic flexure of the
colon. Mild thickening of colonic wall in sigmoid colon and rectum.
Mild colitis cannot be excluded. Scattered diverticula are noted in
descending colon and sigmoid colon. No evidence of acute
diverticulitis. No pericolonic abscess. No diverticular abscess.
5. Status post appendectomy.  No pericecal inflammation.
6. Status post hysterectomy.
7. Bilateral decrease parenchymal enhancement and excretion of the
kidneys suggesting bilateral decreased renal function. Clinical
correlation is necessary

## 2015-07-08 IMAGING — CR DG ABDOMEN ACUTE W/ 1V CHEST
1 series · 4 of 4 positions shown · non-contrast
Comparison: 02/21/2015 chest x-ray

CLINICAL DATA: Chest pain

EXAM:
DG ABDOMEN ACUTE W/ 1V CHEST

[Series 1: x chest ap · 0.14mm/px · 4 of 4 slices shown]
[im 1/4]
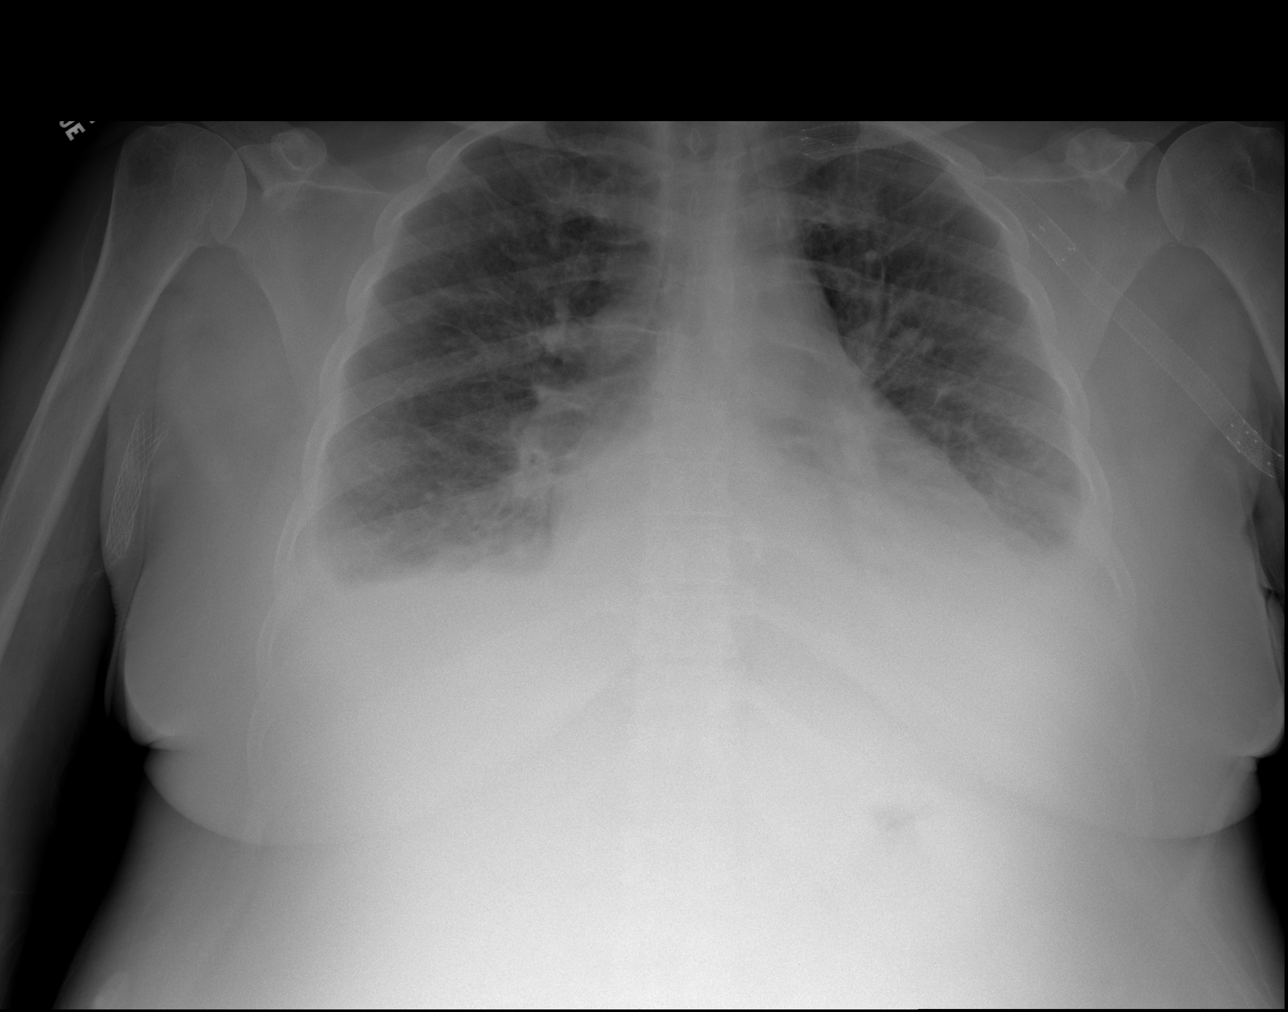
[im 2/4]
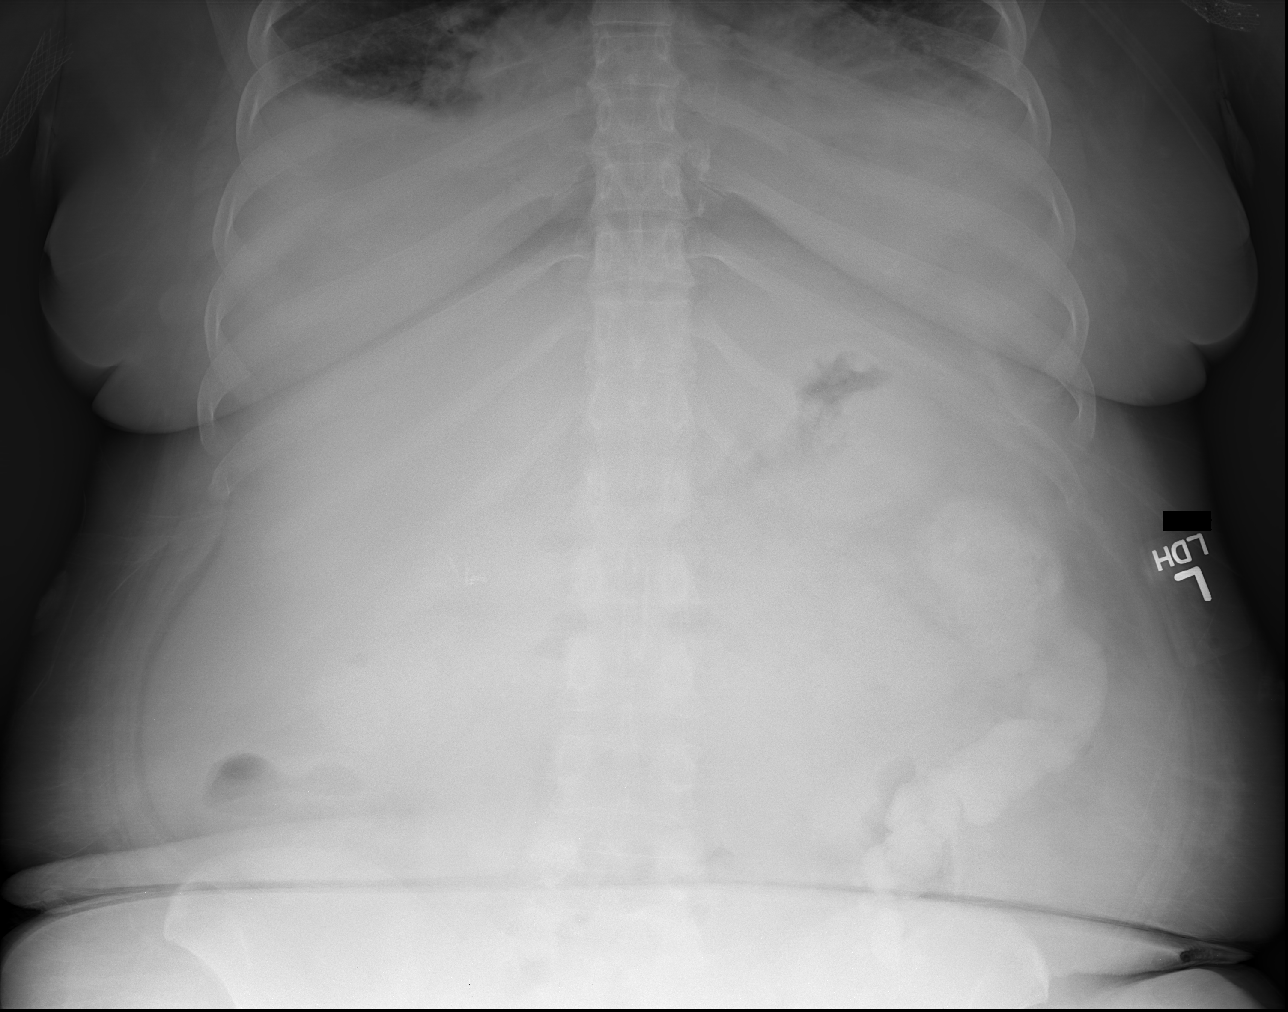
[im 3/4]
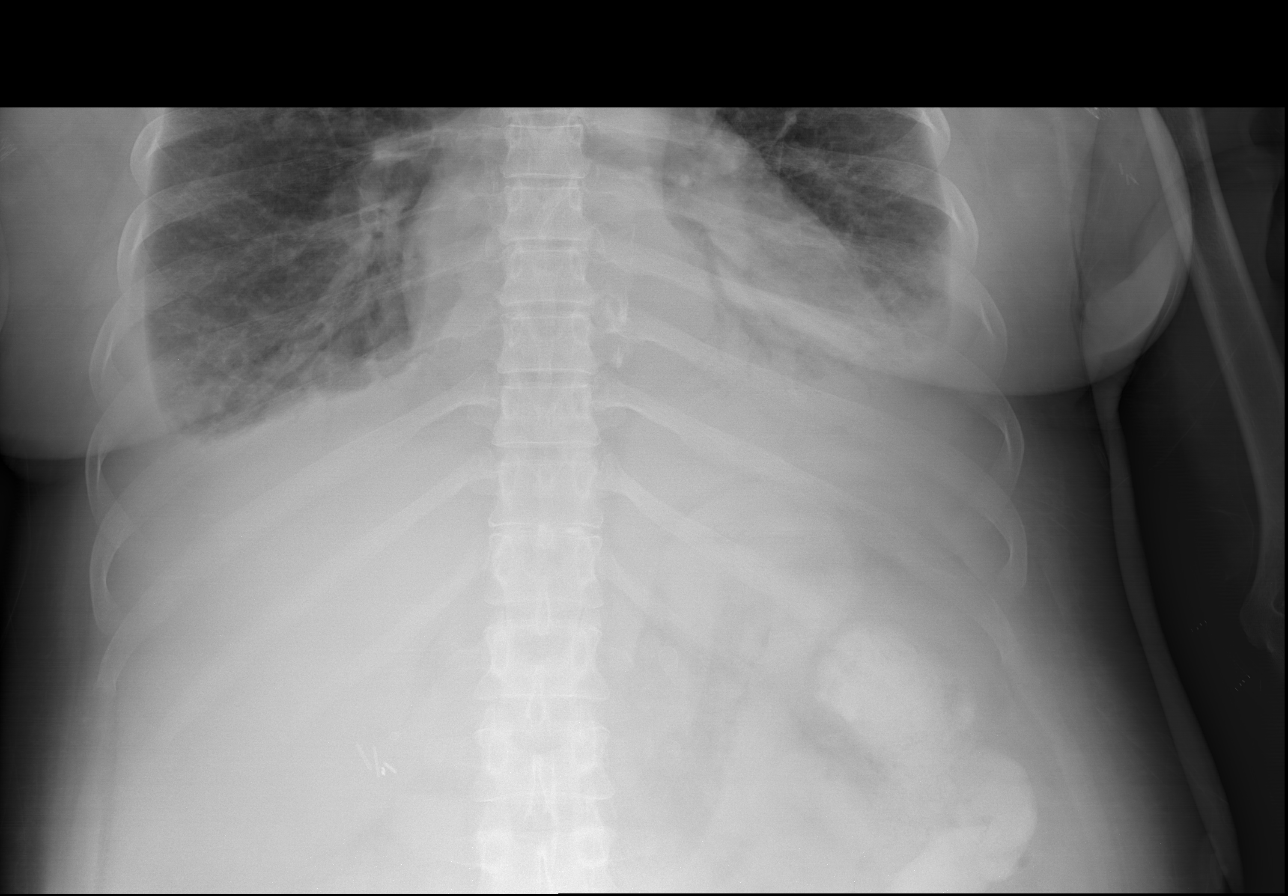
[im 4/4]
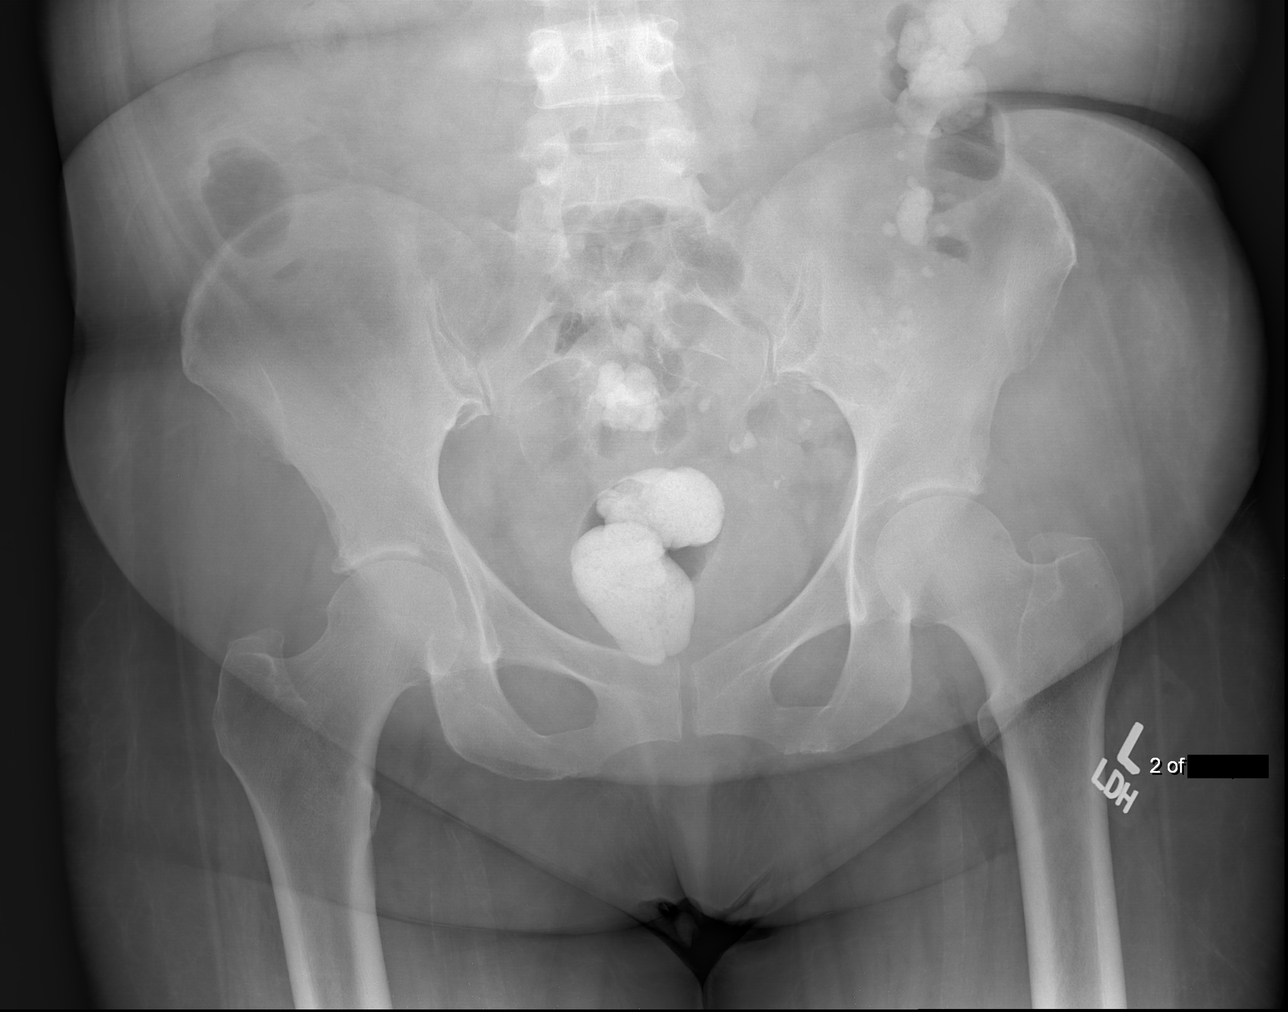

[4 of 4 positions shown; findings below may reference images not displayed]

FINDINGS: There is interstitial opacity and small bilateral pleural effusion.
Superimposed dense opacity in the left lower lobe with air
bronchograms that are crowded. Chronic cardiomegaly. Extensive
venous stenting on the left.

Bowel gas pattern is nonobstructive. No concerning intra-abdominal
mass effect or calcification. Retained oral contrast within the left
colon, outlining diverticula.
IMPRESSION: 1. Pulmonary edema and small pleural effusions.
2. Left lower lobe pneumonia or atelectasis
3. No acute intra-abdominal findings.

## 2015-07-12 IMAGING — DX DG ABDOMEN 1V
1 series · 1 of 1 positions shown · non-contrast
Comparison: 03/02/2015

CLINICAL DATA: Emesis and abdominal pain.

EXAM:
ABDOMEN - 1 VIEW

[abdomen kub]
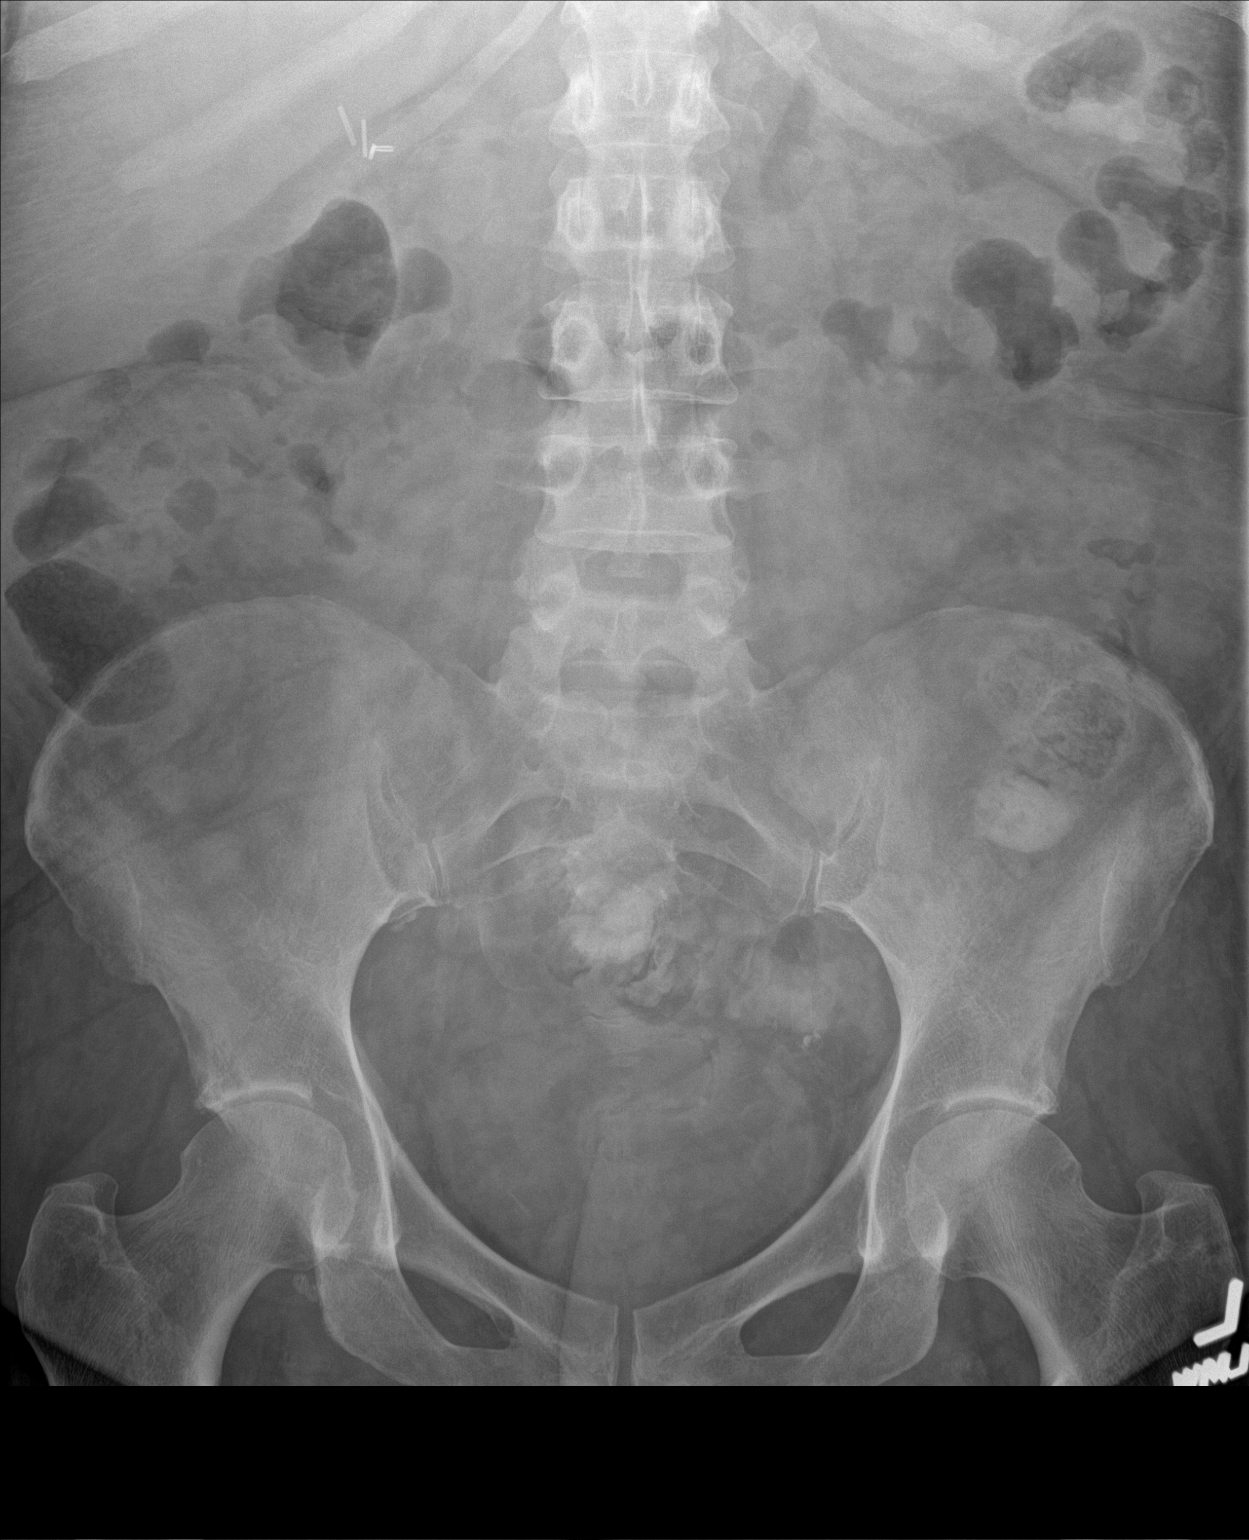

[1 of 1 positions shown; findings below may reference images not displayed]

FINDINGS: The bowel gas pattern is normal. No radio-opaque calculi or other
significant radiographic abnormality are seen. Surgical clips in the
right upper quadrant.
IMPRESSION: Negative.

## 2015-07-12 IMAGING — DX DG CHEST 2V
2 series · 2 of 2 positions shown · non-contrast
Comparison: 03/02/2015

CLINICAL DATA: Vomiting for 2 weeks. Seen here for same recently.
Diffuse abdominal pain for 2 hours.

EXAM:
CHEST  2 VIEW

[chest pa]
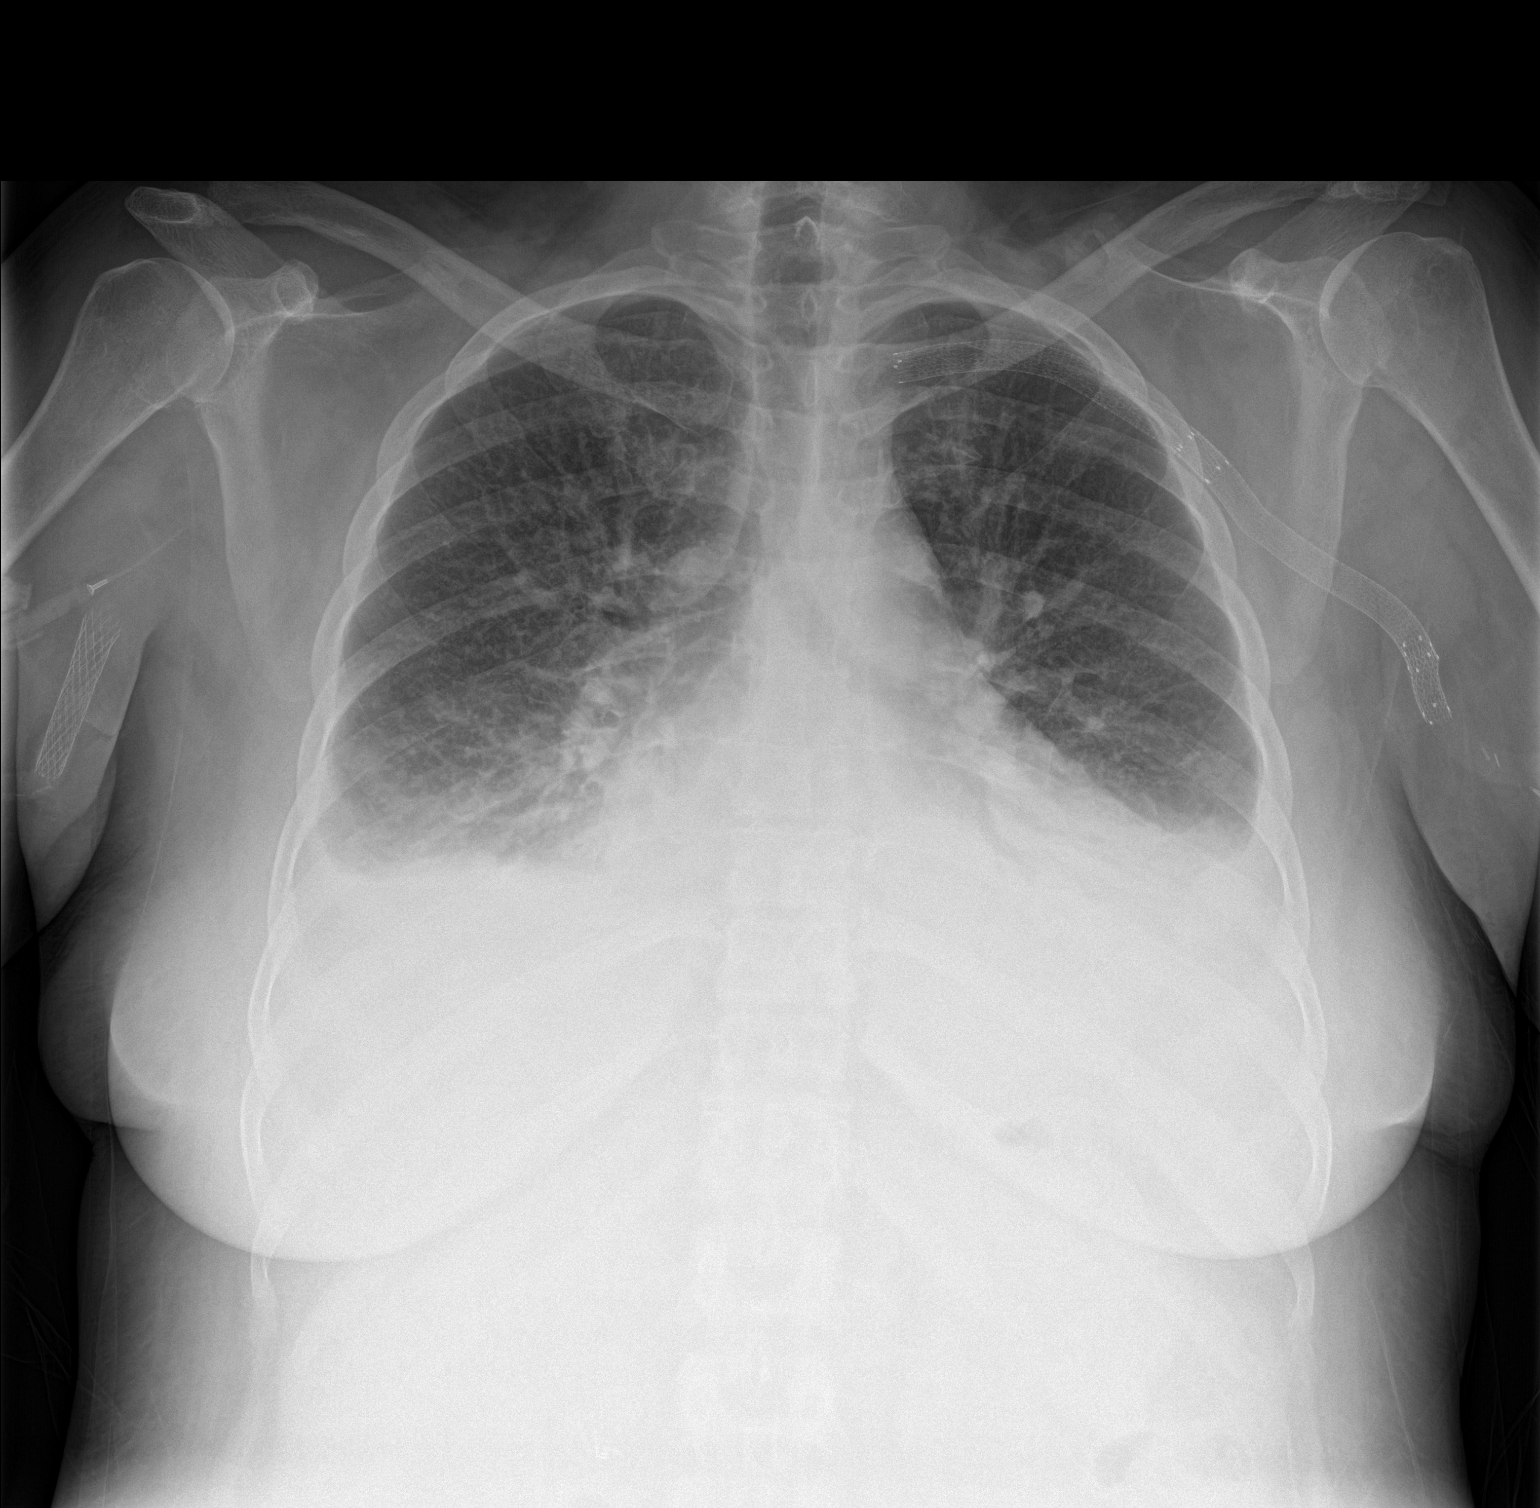

[chest lat]
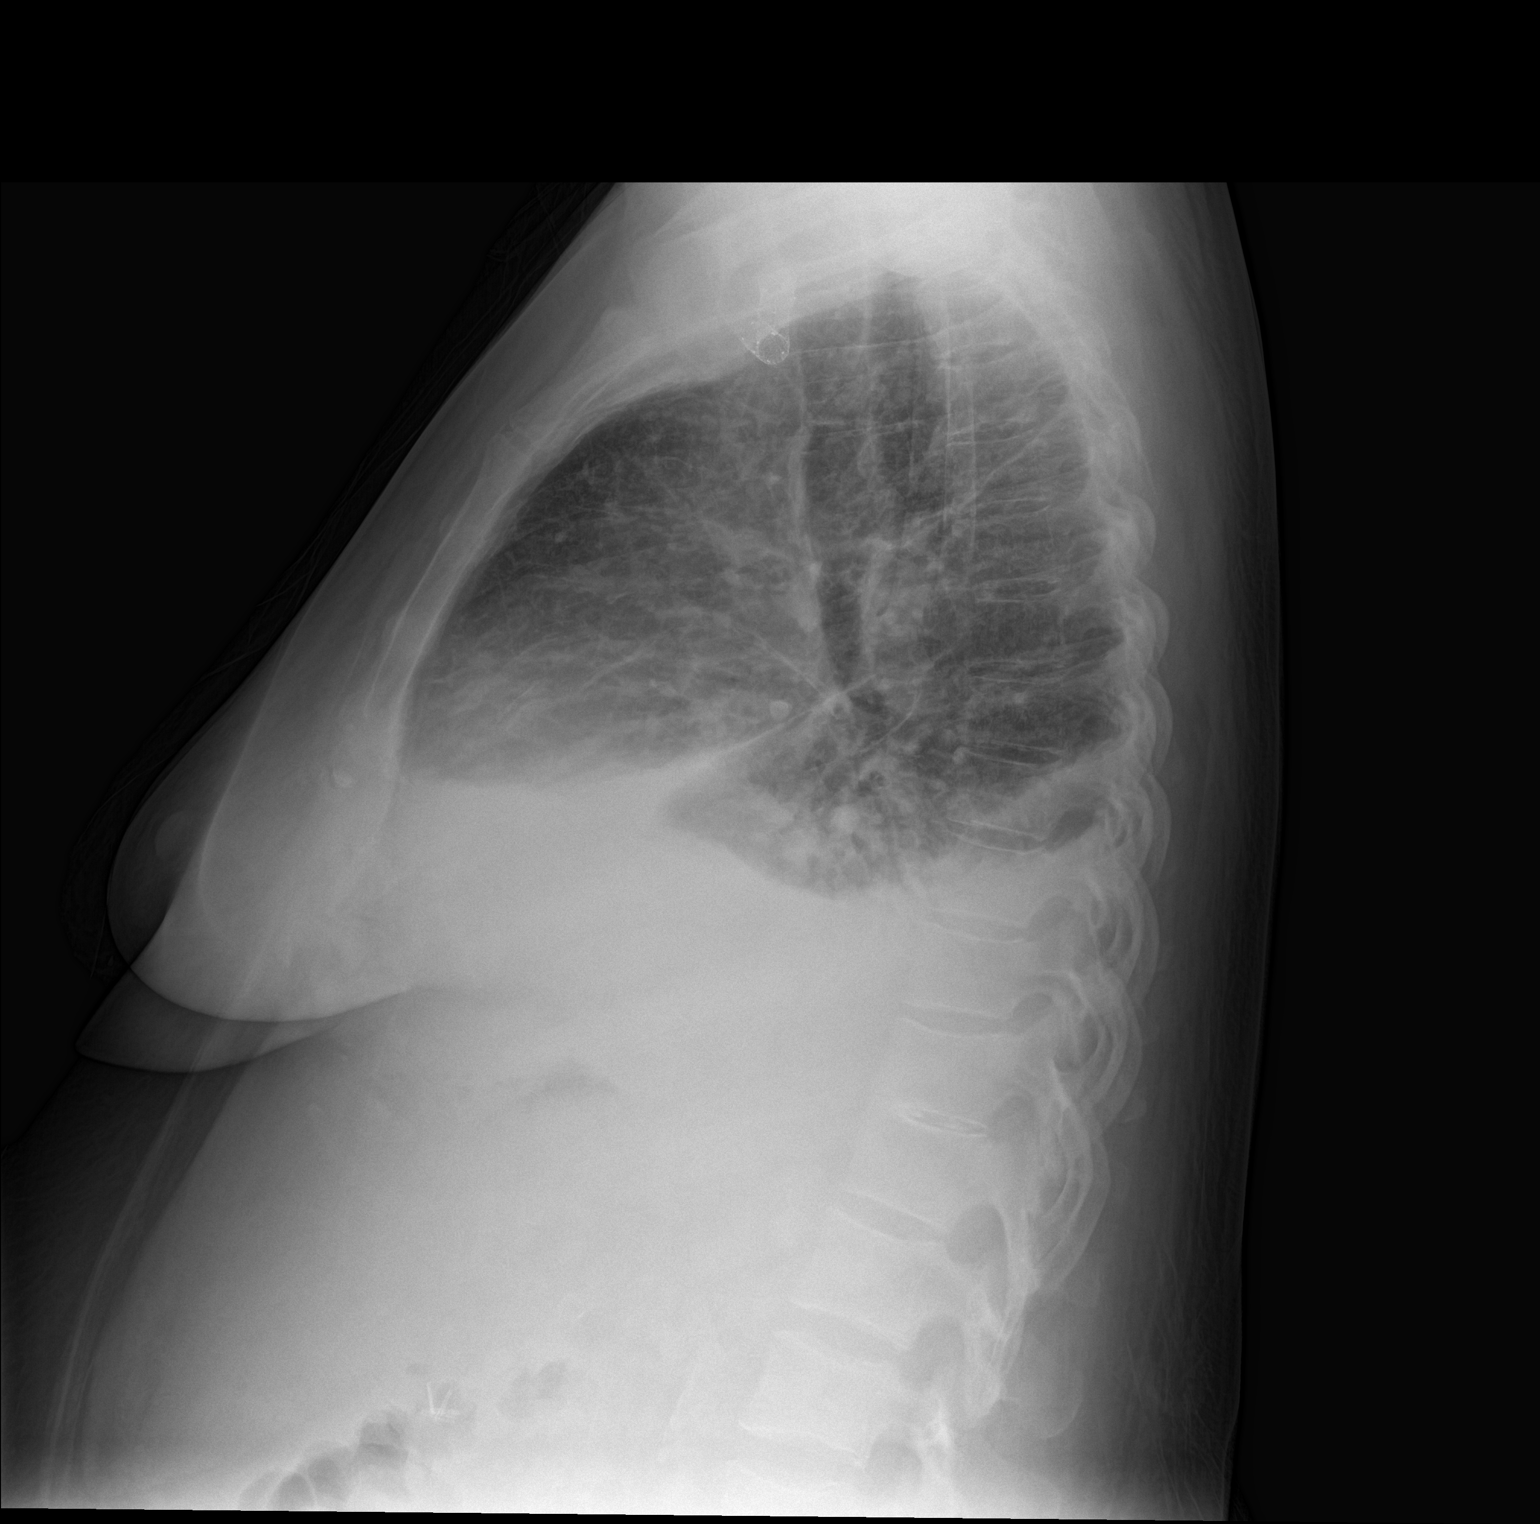

[2 of 2 positions shown; findings below may reference images not displayed]

FINDINGS: Cardiac enlargement with mild pulmonary vascular congestion.
Perihilar edema with small bilateral pleural effusions and basilar
atelectasis. Similar appearance to previous study. Vascular grafts
in both axillary regions.
IMPRESSION: Cardiac enlargement with pulmonary vascular congestion and mild
perihilar edema. Bilateral pleural effusions and basilar
atelectasis.

## 2015-07-16 ENCOUNTER — Other Ambulatory Visit: Payer: Self-pay | Admitting: Internal Medicine

## 2015-07-16 DIAGNOSIS — E213 Hyperparathyroidism, unspecified: Secondary | ICD-10-CM

## 2015-07-19 ENCOUNTER — Ambulatory Visit
Admission: RE | Admit: 2015-07-19 | Discharge: 2015-07-19 | Disposition: A | Discharge status: Home / Self Care | Payer: Medicare Other | Source: Ambulatory Visit | Attending: Internal Medicine | Admitting: Internal Medicine

## 2015-07-19 DIAGNOSIS — A047 Enterocolitis due to Clostridium difficile: Secondary | ICD-10-CM | POA: Diagnosis not present

## 2015-07-19 DIAGNOSIS — E213 Hyperparathyroidism, unspecified: Secondary | ICD-10-CM

## 2015-07-19 DIAGNOSIS — R110 Nausea: Secondary | ICD-10-CM | POA: Diagnosis not present

## 2015-07-20 ENCOUNTER — Emergency Department
Admission: EM | Admit: 2015-07-20 | Discharge: 2015-07-20 | Disposition: A | Discharge status: Home / Self Care | Payer: Medicare Other | Source: Home / Self Care

## 2015-07-20 ENCOUNTER — Emergency Department: Payer: Medicare Other

## 2015-07-20 ENCOUNTER — Encounter: Payer: Self-pay | Admitting: *Deleted

## 2015-07-20 DIAGNOSIS — R112 Nausea with vomiting, unspecified: Secondary | ICD-10-CM

## 2015-07-20 DIAGNOSIS — R197 Diarrhea, unspecified: Secondary | ICD-10-CM

## 2015-07-20 DIAGNOSIS — R1084 Generalized abdominal pain: Secondary | ICD-10-CM

## 2015-07-20 LAB — URINALYSIS COMPLETE WITH MICROSCOPIC (ARMC ONLY)
Bacteria, UA: NONE SEEN
Bilirubin Urine: NEGATIVE
Glucose, UA: NEGATIVE mg/dL
Hgb urine dipstick: NEGATIVE
Ketones, ur: NEGATIVE mg/dL
Leukocytes, UA: NEGATIVE
Nitrite: NEGATIVE
Protein, ur: NEGATIVE mg/dL
Specific Gravity, Urine: 1.009 (ref 1.005–1.030)
pH: 8 (ref 5.0–8.0)

## 2015-07-20 LAB — COMPREHENSIVE METABOLIC PANEL
ALT: 19 U/L (ref 14–54)
AST: 37 U/L (ref 15–41)
Albumin: 2.6 g/dL — ABNORMAL LOW (ref 3.5–5.0)
Alkaline Phosphatase: 115 U/L (ref 38–126)
Anion gap: 13 (ref 5–15)
BUN: 30 mg/dL — ABNORMAL HIGH (ref 6–20)
CO2: 23 mmol/L (ref 22–32)
Calcium: 9.4 mg/dL (ref 8.9–10.3)
Chloride: 102 mmol/L (ref 101–111)
Creatinine, Ser: 5.44 mg/dL — ABNORMAL HIGH (ref 0.44–1.00)
GFR calc Af Amer: 9 mL/min — ABNORMAL LOW (ref >60)
GFR calc non Af Amer: 8 mL/min — ABNORMAL LOW (ref >60)
Glucose, Bld: 91 mg/dL (ref 65–99)
Potassium: 4.2 mmol/L (ref 3.5–5.1)
Sodium: 138 mmol/L (ref 135–145)
Total Bilirubin: 0.7 mg/dL (ref 0.3–1.2)
Total Protein: 7.2 g/dL (ref 6.5–8.1)

## 2015-07-20 LAB — CBC WITH DIFFERENTIAL/PLATELET
Basophils Absolute: 0.4 10*3/uL — ABNORMAL HIGH (ref 0–0.1)
Basophils Relative: 6 %
Eosinophils Absolute: 0.2 10*3/uL (ref 0–0.7)
Eosinophils Relative: 4 %
HCT: 43.5 % (ref 35.0–47.0)
Hemoglobin: 13.7 g/dL (ref 12.0–16.0)
Lymphocytes Relative: 17 %
Lymphs Abs: 1.1 10*3/uL (ref 1.0–3.6)
MCH: 28 pg (ref 26.0–34.0)
MCHC: 31.4 g/dL — ABNORMAL LOW (ref 32.0–36.0)
MCV: 89.1 fL (ref 80.0–100.0)
Monocytes Absolute: 0.5 10*3/uL (ref 0.2–0.9)
Monocytes Relative: 8 %
Neutro Abs: 4.2 10*3/uL (ref 1.4–6.5)
Neutrophils Relative %: 65 %
Platelets: 228 10*3/uL (ref 150–440)
RBC: 4.89 MIL/uL (ref 3.80–5.20)
RDW: 21.2 % — ABNORMAL HIGH (ref 11.5–14.5)
WBC: 6.5 10*3/uL (ref 3.6–11.0)

## 2015-07-20 LAB — LIPASE, BLOOD: Lipase: 26 U/L (ref 11–51)

## 2015-07-20 MED ORDER — METOCLOPRAMIDE HCL 5 MG/ML IJ SOLN
INTRAMUSCULAR | Status: AC
  Administered 2015-07-20: 10 mg via INTRAVENOUS
  Filled 2015-07-20: qty 2

## 2015-07-20 MED ORDER — METOCLOPRAMIDE HCL 5 MG/ML IJ SOLN
10.0000 mg | Freq: Once | INTRAMUSCULAR | Status: AC
  Administered 2015-07-20: 10 mg via INTRAVENOUS

## 2015-07-20 MED ORDER — HYDROMORPHONE HCL 1 MG/ML IJ SOLN
1.0000 mg | Freq: Once | INTRAMUSCULAR | Status: AC
  Administered 2015-07-20: 1 mg via INTRAVENOUS
  Filled 2015-07-20: qty 1

## 2015-07-20 NOTE — Discharge Instructions (Signed)
°Abdominal Pain, Adult °Many things can cause abdominal pain. Usually, abdominal pain is not caused by a disease and will improve without treatment. It can often be observed and treated at home. Your health care provider will do a physical exam and possibly order blood tests and X-rays to help determine the seriousness of your pain. However, in many cases, more time must pass before a clear cause of the pain can be found. Before that point, your health care provider may not know if you need more testing or further treatment. °HOME CARE INSTRUCTIONS °Monitor your abdominal pain for any changes. The following actions may help to alleviate any discomfort you are experiencing: °· Only take over-the-counter or prescription medicines as directed by your health care provider. °· Do not take laxatives unless directed to do so by your health care provider. °· Try a clear liquid diet (broth, tea, or water) as directed by your health care provider. Slowly move to a bland diet as tolerated. °SEEK MEDICAL CARE IF: °· You have unexplained abdominal pain. °· You have abdominal pain associated with nausea or diarrhea. °· You have pain when you urinate or have a bowel movement. °· You experience abdominal pain that wakes you in the night. °· You have abdominal pain that is worsened or improved by eating food. °· You have abdominal pain that is worsened with eating fatty foods. °· You have a fever. °SEEK IMMEDIATE MEDICAL CARE IF: °· Your pain does not go away within 2 hours. °· You keep throwing up (vomiting). °· Your pain is felt only in portions of the abdomen, such as the right side or the left lower portion of the abdomen. °· You pass bloody or black tarry stools. °MAKE SURE YOU: °· Understand these instructions. °· Will watch your condition. °· Will get help right away if you are not doing well or get worse. °  °This information is not intended to replace advice given to you by your health care provider. Make sure you discuss  any questions you have with your health care provider. °  °Document Released: 06/18/2005 Document Revised: 05/30/2015 Document Reviewed: 05/18/2013 °Elsevier Interactive Patient Education ©2016 Elsevier Inc. °Diarrhea °Diarrhea is watery poop (stool). It can make you feel weak, tired, thirsty, or give you a dry mouth (signs of dehydration). Watery poop is a sign of another problem, most often an infection. It often lasts 2-3 days. It can last longer if it is a sign of something serious. Take care of yourself as told by your doctor. °HOME CARE  °· Drink 1 cup (8 ounces) of fluid each time you have watery poop. °· Do not drink the following fluids: °· Those that contain simple sugars (fructose, glucose, galactose, lactose, sucrose, maltose). °· Sports drinks. °· Fruit juices. °· Whole milk products. °· Sodas. °· Drinks with caffeine (coffee, tea, soda) or alcohol. °· Oral rehydration solution may be used if the doctor says it is okay. You may make your own solution. Follow this recipe: °·  - teaspoon table salt. °· ¾ teaspoon baking soda. °·  teaspoon salt substitute containing potassium chloride. °· 1 tablespoons sugar. °· 1 liter (34 ounces) of water. °· Avoid the following foods: °· High fiber foods, such as raw fruits and vegetables. °· Nuts, seeds, and whole grain breads and cereals. °·  Those that are sweetened with sugar alcohols (xylitol, sorbitol, mannitol). °· Try eating the following foods: °· Starchy foods, such as rice, toast, pasta, low-sugar cereal, oatmeal, baked potatoes, crackers, and bagels. °·   bagels.  Bananas.  Applesauce.  Eat probiotic-rich foods, such as yogurt and milk products that are fermented.  Wash your hands well after each time you have watery poop.  Only take medicine as told by your doctor.  Take a warm bath to help lessen burning or pain from having watery poop. GET HELP RIGHT AWAY IF:   You cannot drink fluids without throwing up (vomiting).  You keep throwing up.  You  have blood in your poop, or your poop looks black and tarry.  You do not pee (urinate) in 6-8 hours, or there is only a small amount of very dark pee.  You have belly (abdominal) pain that gets worse or stays in the same spot (localizes).  You are weak, dizzy, confused, or light-headed.  You have a very bad headache.  Your watery poop gets worse or does not get better.  You have a fever or lasting symptoms for more than 2-3 days.  You have a fever and your symptoms suddenly get worse. MAKE SURE YOU:   Understand these instructions.  Will watch your condition.  Will get help right away if you are not doing well or get worse.   This information is not intended to replace advice given to you by your health care provider. Make sure you discuss any questions you have with your health care provider.   Document Released: 02/25/2008 Document Revised: 09/29/2014 Document Reviewed: 05/16/2012 Elsevier Interactive Patient Education 2016 Elsevier Inc.  Nausea and Vomiting Nausea means you feel sick to your stomach. Throwing up (vomiting) is a reflex where stomach contents come out of your mouth. HOME CARE   Take medicine as told by your doctor.  Do not force yourself to eat. However, you do need to drink fluids.  If you feel like eating, eat a normal diet as told by your doctor.  Eat rice, wheat, potatoes, bread, lean meats, yogurt, fruits, and vegetables.  Avoid high-fat foods.  Drink enough fluids to keep your pee (urine) clear or pale yellow.  Ask your doctor how to replace body fluid losses (rehydrate). Signs of body fluid loss (dehydration) include:  Feeling very thirsty.  Dry lips and mouth.  Feeling dizzy.  Dark pee.  Peeing less than normal.  Feeling confused.  Fast breathing or heart rate. GET HELP RIGHT AWAY IF:   You have blood in your throw up.  You have black or bloody poop (stool).  You have a bad headache or stiff neck.  You feel confused.  You  have bad belly (abdominal) pain.  You have chest pain or trouble breathing.  You do not pee at least once every 8 hours.  You have cold, clammy skin.  You keep throwing up after 24 to 48 hours.  You have a fever. MAKE SURE YOU:   Understand these instructions.  Will watch your condition.  Will get help right away if you are not doing well or get worse.   This information is not intended to replace advice given to you by your health care provider. Make sure you discuss any questions you have with your health care provider.   Document Released: 02/25/2008 Document Revised: 12/01/2011 Document Reviewed: 02/07/2011 Elsevier Interactive Patient Education [employer].

## 2015-07-20 NOTE — ED Provider Notes (Addendum)
[facility] Provider Note  ____________________________________________  Time seen: Approximately 310 PM  I have reviewed the triage vital signs and the nursing notes.   HISTORY  Chief Complaint Abdominal Pain    HPI [name] is a 54 y.o. female who is an end-stage renal disease patient on dialysis was presenting today with abdominal pain, nausea vomiting and diarrhea. She says the pain started yesterday and has been intermittent and cramping. Says the pain is localized to the upper and right upper abdomen. She says that she gets these symptoms about once every other month and this is identical in presentation to what she has had before. She is denying any chest pain or shortness of breath. She says that when she called the nurse into the room before for shortness of breath she said that she was about to vomit. Otherwise when she is not vomiting she is not having any shortness of breath. She says that she is having loose watery bowel movements about 2-3 times an hour since last night. She says because of this she did not go to dialysis this morning because she was back and forth so many times to the toilet. She has a history of C. difficile. Denies any recent antibiotics.Denies any blood in her vomit or her stool. She reports that she has had her gallbladder out as well as her appendix. She denies any history of kidney stones.   Past Medical History  Diagnosis Date  . Asthma   . Collagen vascular disease ([place])   . Diabetes mellitus without complication ([place])   . Hypertension   . Coronary artery disease     a. cath 2013: stenting to RCA (report not available); b. cath 2014: LM nl, pLAD 40%, mLAD nl, ost LCx 40%, mid LCx nl, pRCA 30% @ site of prior stent, mRCA 50%  . Diabetic neuropathy ([place])   . ESRD (end stage renal disease) on dialysis ([place])     M-W-F  . GERD (gastroesophageal reflux disease)   . Hx of pancreatitis 2015  . Myocardial  infarction ([place])   . Pneumonia   . Mitral regurgitation     a. echo 10/2013: EF 62%, noWMA, mildly dilated LA, mild to mod MR/TR, GR1DD  . COPD (chronic obstructive pulmonary disease) ([place])   . chronic diastolic CHF 123XX123  . dialysis 2006    Patient Active Problem List   Diagnosis Date Noted  . Pneumonia 05/21/2015  . Hypoglycemia 04/24/2015  . Unresponsiveness 04/24/2015  . Bradycardia 04/24/2015  . Hypothermia 04/24/2015  . Acute respiratory failure ([place]) 04/24/2015  . Acute diastolic CHF (congestive heart failure) ([place]) 04/05/2015  . Diabetic gastroparesis ([place]) 04/05/2015  . Hypokalemia 04/05/2015  . Generalized weakness 04/05/2015  . Acute pulmonary edema ([place]) 04/03/2015  . Nausea and vomiting 04/03/2015  . Hypoglycemia associated with diabetes ([place]) 04/03/2015  . Anemia of chronic disease 04/03/2015  . Secondary hyperparathyroidism ([place]) 04/03/2015  . Pressure ulcer 04/02/2015  . Acute respiratory failure with hypoxia ([place]) 04/01/2015  . Adjustment disorder with anxiety 03/14/2015  . Somatic symptom disorder, mild 03/08/2015  . Coronary artery disease involving native coronary artery of native heart without angina pectoris   . Nausea & vomiting 03/06/2015  . Abdominal pain 03/06/2015  . DM (diabetes mellitus) ([place]) 03/06/2015  . HTN (hypertension) 03/06/2015  . Gastroparesis 02/24/2015  . Pleural effusion 02/19/2015  . HCAP (healthcare-associated pneumonia) 02/19/2015  . End-stage renal disease on hemodialysis ([place]) 02/19/2015    Past Surgical History  Procedure Laterality Date  . Cholecystectomy    . Appendectomy    . Abdominal hysterectomy    . Dialysis fistula creation Left     upper arm  . Esophagogastroduodenoscopy N/A 03/08/2015    Procedure: ESOPHAGOGASTRODUODENOSCOPY (EGD);  Surgeon: Manya Silvas, MD;  Location: [facility] ENDOSCOPY;  Service: Endoscopy;  Laterality: N/A;    Current Outpatient Rx  Name  Route  Sig  Dispense  Refill  . albuterol  (PROVENTIL HFA;VENTOLIN HFA) 108 (90 BASE) MCG/ACT inhaler      Inhale 4-6 puffs by mouth every 4 hours as needed for wheezing, cough, and/or shortness of breath Patient taking differently: Inhale 4-6 puffs into the lungs every 4 (four) hours as needed for wheezing or shortness of breath (or cough).    1 Inhaler   1   . albuterol (PROVENTIL) (2.5 MG/3ML) 0.083% nebulizer solution   Nebulization   Take 2.5 mg by nebulization every 4 (four) hours as needed for wheezing or shortness of breath.         Marland Kitchen amLODipine (NORVASC) 10 MG tablet   Oral   Take 10 mg by mouth daily.         Marland Kitchen aspirin EC 81 MG tablet   Oral   Take 81 mg by mouth daily.         Marland Kitchen atorvastatin (LIPITOR) 20 MG tablet   Oral   Take 20 mg by mouth every evening.         . B Complex-C-Biotin-E-FA (RENATABS PO)   Oral   Take 1 tablet by mouth daily.         . carvedilol (COREG) 25 MG tablet   Oral   Take 25 mg by mouth 2 (two) times daily.         . cetirizine (ZYRTEC) 10 MG tablet   Oral   Take 10 mg by mouth daily.         . citalopram (CELEXA) 20 MG tablet   Oral   Take 20 mg by mouth daily.         . clopidogrel (PLAVIX) 75 MG tablet   Oral   Take 75 mg by mouth daily.         Marland Kitchen docusate sodium (COLACE) 100 MG capsule   Oral   Take 1 capsule (100 mg total) by mouth 2 (two) times daily.   60 capsule   0   . guaiFENesin-dextromethorphan (ROBITUSSIN DM) 100-10 MG/5ML syrup   Oral   Take 10 mLs by mouth every 6 (six) hours as needed for cough.   118 mL   0   . HYDROcodone-acetaminophen (NORCO/VICODIN) 5-325 MG per tablet   Oral   Take 1 tablet by mouth 3 (three) times daily as needed for moderate pain.          Marland Kitchen levofloxacin (LEVAQUIN) 750 MG tablet   Oral   Take 750 mg by mouth every Monday, Wednesday, and Friday.         . lipase/protease/amylase (CREON) 12000 UNITS CPEP capsule   Oral   Take 36,000 Units by mouth 3 (three) times daily with meals.          .  metoCLOPramide (REGLAN) 5 MG tablet   Oral   Take 1 tablet (5 mg total) by mouth 4 (four) times daily -  before meals and at bedtime.   40 tablet   0   . nitroGLYCERIN (NITROSTAT) 0.4 MG SL tablet   Sublingual   Place 0.4 mg  under the tongue every 5 (five) minutes x 3 doses as needed for chest pain.          Marland Kitchen ondansetron (ZOFRAN ODT) 4 MG disintegrating tablet   Oral   Take 1 tablet (4 mg total) by mouth every 8 (eight) hours as needed for nausea or vomiting.   20 tablet   0   . pantoprazole (PROTONIX) 40 MG tablet   Oral   Take 40 mg by mouth daily.         . prochlorperazine (COMPAZINE) 5 MG tablet   Oral   Take 1 tablet (5 mg total) by mouth every 6 (six) hours as needed for nausea or vomiting.   30 tablet   1   . promethazine (PHENERGAN) 12.5 MG tablet   Oral   Take 12.5 mg by mouth every 6 (six) hours as needed for nausea or vomiting.         . promethazine (PHENERGAN) 25 MG suppository   Rectal   Place 1 suppository (25 mg total) rectally every 8 (eight) hours as needed for nausea or vomiting.   12 each   0   . ranitidine (ZANTAC) 150 MG tablet   Oral   Take 150 mg by mouth daily.          . ranolazine (RANEXA) 1000 MG SR tablet   Oral   Take 1,000 mg by mouth 2 (two) times daily.         . sevelamer carbonate (RENVELA) 800 MG tablet   Oral   Take 800 mg by mouth 3 (three) times daily with meals.          . sucralfate (CARAFATE) 1 G tablet   Oral   Take 1 tablet (1 g total) by mouth 4 (four) times daily -  with meals and at bedtime.   120 tablet   0   . Vitamin D, Cholecalciferol, 400 UNITS TABS   Oral   Take 400 Units by mouth daily.            Allergies Ace inhibitors; Ativan; Codeine; Compazine; Gabapentin; Losartan; Scopolamine; Zofran; Oxycodone; and Tape  Family History  Problem Relation Age of Onset  . Kidney disease Mother   . Diabetes Mother     Social History Social History  Substance Use Topics  . Smoking status:  Current Some Day Smoker -- 0.50 packs/day    Types: Cigarettes    Last Attempt to Quit: 02/13/2015  . Smokeless tobacco: Never Used  . Alcohol Use: No    Review of Systems Constitutional: No fever/chills Eyes: No visual changes. ENT: No sore throat. Cardiovascular: Denies chest pain. Respiratory: Denies shortness of breath. Gastrointestinal: No constipation. Genitourinary: Negative for dysuria. Musculoskeletal: Negative for back pain. Skin: Negative for rash. Neurological: Negative for headaches, focal weakness or numbness.  10-point ROS otherwise negative.  ____________________________________________   PHYSICAL EXAM:  VITAL SIGNS: ED Triage Vitals  Enc Vitals Group     BP 07/20/15 1244 166/76 mmHg     Pulse Rate 07/20/15 1244 70     Resp 07/20/15 1244 18     Temp 07/20/15 1244 97.6 F (36.4 C)     Temp Source 07/20/15 1244 Oral     SpO2 07/20/15 1244 100 %     Weight 07/20/15 1244 174 lb (78.926 kg)     Height 07/20/15 1244 5\' 5"  (1.651 m)     Head Cir --      Peak Flow --  Pain Score 07/20/15 1245 9     Pain Loc --      Pain Edu? --      Excl. in [place]? --     Constitutional: Alert and oriented. Well appearing and in no acute distress. Eyes: Conjunctivae are normal. PERRL. EOMI. Head: Atraumatic. Nose: No congestion/rhinnorhea. Mouth/Throat: Mucous membranes are moist.  Oropharynx non-erythematous. Neck: No stridor.   Cardiovascular: Normal rate, regular rhythm. Grossly normal heart sounds.  Good peripheral circulation. Left upper extremity dialysis access with palpable thrill. Respiratory: Normal respiratory effort.  No retractions. Lungs CTAB. Gastrointestinal: Soft and with right upper quadrant as well as epigastric tenderness to palpation. There is no right lower quadrant or tenderness over McBurney's point.. No distention. No abdominal bruits. No CVA tenderness. Musculoskeletal: No lower extremity tenderness nor edema.  No joint effusions. Neurologic:   Normal speech and language. No gross focal neurologic deficits are appreciated. No gait instability. Skin:  Skin is warm, dry and intact. No rash noted. Psychiatric: Mood and affect are normal. Speech and behavior are normal.  ____________________________________________   LABS (all labs ordered are listed, but only abnormal results are displayed)  Labs Reviewed  CBC WITH DIFFERENTIAL/PLATELET - Abnormal; Notable for the following:    MCHC 31.4 (*)    RDW 21.2 (*)    Basophils Absolute 0.4 (*)    All other components within normal limits  COMPREHENSIVE METABOLIC PANEL - Abnormal; Notable for the following:    BUN 30 (*)    Creatinine, Ser 5.44 (*)    Albumin 2.6 (*)    GFR calc non Af Amer 8 (*)    GFR calc Af Amer 9 (*)    All other components within normal limits  URINALYSIS COMPLETEWITH MICROSCOPIC ([facility] ONLY) - Abnormal; Notable for the following:    Color, Urine YELLOW (*)    APPearance HAZY (*)    Squamous Epithelial / LPF 6-30 (*)    All other components within normal limits  C DIFFICILE QUICK SCREEN W PCR REFLEX  LIPASE, BLOOD   ____________________________________________  EKG  ED ECG REPORT I, Doran Stabler, the attending physician, personally viewed and interpreted this ECG.   Date: 07/20/2015  EKG Time: 1538  Rate: 65  Rhythm: normal sinus rhythm  Axis: Normal axis  Intervals:Borderline long QT interval.  ST&T Change: No ST elevations or depressions. No abnormal T-wave inversions. PVC times one. Poor baseline we'll repeat. No significant change from 06/17/2015.  ED ECG REPORT I, Doran Stabler, the attending physician, personally viewed and interpreted this ECG.   Date: 07/20/2015  EKG Time: 1608  Rate: 70  Rhythm: normal sinus rhythm  Axis: Normal axis  Intervals:Long QT  ST&T Change: No ST segment elevation or depression. No abnormal T-wave inversion. PVC times one.no significant change from  previous.  ____________________________________________  RADIOLOGY  Small bilateral pleural effusions and central venous congestion which is consistent with previous imaging. ____________________________________________   PROCEDURES   ____________________________________________   INITIAL IMPRESSION / ASSESSMENT AND PLAN / ED COURSE  Pertinent labs & imaging results that were available during my care of the patient were reviewed by me and considered in my medical decision making (see chart for details).  Discussed with Dr. Holley Raring who says that the patient should call her dialysis center tomorrow so she does not go until Monday without being dialyzed.  ----------------------------------------- 6:46 PM on 07/20/2015 -----------------------------------------  The patient is resting a little bit this time after her pain medication. She understands the  plan to follow-up with her dialysis center and says she will be able to call first thing in the morning to be dialyzed tomorrow. She has a contact information for dialysis at home is willing to comply with these instructions. She did have a bowel movement it was well-formed which makes it highly unlikely for this to be C. difficile considering she has not had diarrhea in the emergency department. This likely represents a recurrence of her chronic pain episodes. She will be discharged home. ____________________________________________   FINAL CLINICAL IMPRESSION(S) / ED DIAGNOSES  Acute on chronic abdominal pain with nausea vomiting and diarrhea.    Orbie Pyo, MD 07/20/15 1851  Additionally, the patient has not any nausea or vomiting since her medications.  Orbie Pyo, MD 07/20/15 [phone]

## 2015-07-20 NOTE — ED Notes (Signed)
Pt called this RN into room stating "I feel like I cant catch my breath", pt encouraged to take slow deep breathes, pt o2 sat 100% on RA

## 2015-07-20 NOTE — ED Notes (Signed)
Patient transported to X-ray 

## 2015-07-20 NOTE — ED Notes (Signed)
Py resting in bed quietly in no distress, resp even and unlabored

## 2015-07-20 NOTE — ED Notes (Addendum)
Pt arrives via EMS, pt states abd pain and nausea and vomiting, states hx of pancreititis, dialyasis pt MWF

## 2015-07-22 ENCOUNTER — Emergency Department: Payer: Medicare Other

## 2015-07-22 ENCOUNTER — Other Ambulatory Visit: Payer: Self-pay

## 2015-07-22 ENCOUNTER — Encounter: Payer: Self-pay | Admitting: Emergency Medicine

## 2015-07-22 ENCOUNTER — Inpatient Hospital Stay
Admission: EM | Admit: 2015-07-22 | Discharge: 2015-07-26 | DRG: 371 | Disposition: A | Discharge status: Home / Self Care | Payer: Medicare Other | Attending: Internal Medicine | Admitting: Internal Medicine

## 2015-07-22 DIAGNOSIS — K219 Gastro-esophageal reflux disease without esophagitis: Secondary | ICD-10-CM | POA: Diagnosis present

## 2015-07-22 DIAGNOSIS — F17200 Nicotine dependence, unspecified, uncomplicated: Secondary | ICD-10-CM | POA: Diagnosis present

## 2015-07-22 DIAGNOSIS — E861 Hypovolemia: Secondary | ICD-10-CM | POA: Diagnosis present

## 2015-07-22 DIAGNOSIS — E1143 Type 2 diabetes mellitus with diabetic autonomic (poly)neuropathy: Secondary | ICD-10-CM | POA: Diagnosis present

## 2015-07-22 DIAGNOSIS — J449 Chronic obstructive pulmonary disease, unspecified: Secondary | ICD-10-CM | POA: Diagnosis present

## 2015-07-22 DIAGNOSIS — Z90710 Acquired absence of both cervix and uterus: Secondary | ICD-10-CM

## 2015-07-22 DIAGNOSIS — I252 Old myocardial infarction: Secondary | ICD-10-CM

## 2015-07-22 DIAGNOSIS — E785 Hyperlipidemia, unspecified: Secondary | ICD-10-CM | POA: Diagnosis present

## 2015-07-22 DIAGNOSIS — D631 Anemia in chronic kidney disease: Secondary | ICD-10-CM | POA: Diagnosis present

## 2015-07-22 DIAGNOSIS — R110 Nausea: Secondary | ICD-10-CM

## 2015-07-22 DIAGNOSIS — K3184 Gastroparesis: Secondary | ICD-10-CM | POA: Diagnosis present

## 2015-07-22 DIAGNOSIS — Z886 Allergy status to analgesic agent status: Secondary | ICD-10-CM | POA: Diagnosis not present

## 2015-07-22 DIAGNOSIS — I5032 Chronic diastolic (congestive) heart failure: Secondary | ICD-10-CM | POA: Diagnosis present

## 2015-07-22 DIAGNOSIS — Z9049 Acquired absence of other specified parts of digestive tract: Secondary | ICD-10-CM | POA: Diagnosis not present

## 2015-07-22 DIAGNOSIS — N2581 Secondary hyperparathyroidism of renal origin: Secondary | ICD-10-CM | POA: Diagnosis present

## 2015-07-22 DIAGNOSIS — I472 Ventricular tachycardia: Secondary | ICD-10-CM | POA: Diagnosis present

## 2015-07-22 DIAGNOSIS — R197 Diarrhea, unspecified: Secondary | ICD-10-CM

## 2015-07-22 DIAGNOSIS — N186 End stage renal disease: Secondary | ICD-10-CM | POA: Diagnosis present

## 2015-07-22 DIAGNOSIS — E11319 Type 2 diabetes mellitus with unspecified diabetic retinopathy without macular edema: Secondary | ICD-10-CM | POA: Diagnosis present

## 2015-07-22 DIAGNOSIS — Z992 Dependence on renal dialysis: Secondary | ICD-10-CM | POA: Diagnosis not present

## 2015-07-22 DIAGNOSIS — Z79899 Other long term (current) drug therapy: Secondary | ICD-10-CM

## 2015-07-22 DIAGNOSIS — I132 Hypertensive heart and chronic kidney disease with heart failure and with stage 5 chronic kidney disease, or end stage renal disease: Secondary | ICD-10-CM | POA: Diagnosis present

## 2015-07-22 DIAGNOSIS — A0472 Enterocolitis due to Clostridium difficile, not specified as recurrent: Secondary | ICD-10-CM

## 2015-07-22 DIAGNOSIS — E1122 Type 2 diabetes mellitus with diabetic chronic kidney disease: Secondary | ICD-10-CM | POA: Diagnosis present

## 2015-07-22 DIAGNOSIS — Z9889 Other specified postprocedural states: Secondary | ICD-10-CM

## 2015-07-22 DIAGNOSIS — Z841 Family history of disorders of kidney and ureter: Secondary | ICD-10-CM | POA: Diagnosis not present

## 2015-07-22 DIAGNOSIS — F329 Major depressive disorder, single episode, unspecified: Secondary | ICD-10-CM | POA: Diagnosis present

## 2015-07-22 DIAGNOSIS — A047 Enterocolitis due to Clostridium difficile: Secondary | ICD-10-CM | POA: Diagnosis present

## 2015-07-22 DIAGNOSIS — J45909 Unspecified asthma, uncomplicated: Secondary | ICD-10-CM | POA: Diagnosis present

## 2015-07-22 DIAGNOSIS — I2510 Atherosclerotic heart disease of native coronary artery without angina pectoris: Secondary | ICD-10-CM | POA: Diagnosis present

## 2015-07-22 DIAGNOSIS — Z888 Allergy status to other drugs, medicaments and biological substances status: Secondary | ICD-10-CM | POA: Diagnosis not present

## 2015-07-22 DIAGNOSIS — Z833 Family history of diabetes mellitus: Secondary | ICD-10-CM

## 2015-07-22 LAB — CBC
HCT: 42.9 % (ref 35.0–47.0)
Hemoglobin: 13.5 g/dL (ref 12.0–16.0)
MCH: 27.8 pg (ref 26.0–34.0)
MCHC: 31.5 g/dL — ABNORMAL LOW (ref 32.0–36.0)
MCV: 88.4 fL (ref 80.0–100.0)
Platelets: 231 10*3/uL (ref 150–440)
RBC: 4.86 MIL/uL (ref 3.80–5.20)
RDW: 21 % — ABNORMAL HIGH (ref 11.5–14.5)
WBC: 5.5 10*3/uL (ref 3.6–11.0)

## 2015-07-22 LAB — COMPREHENSIVE METABOLIC PANEL
ALT: 17 U/L (ref 14–54)
AST: 27 U/L (ref 15–41)
Albumin: 2.8 g/dL — ABNORMAL LOW (ref 3.5–5.0)
Alkaline Phosphatase: 114 U/L (ref 38–126)
Anion gap: 14 (ref 5–15)
BUN: 38 mg/dL — ABNORMAL HIGH (ref 6–20)
CO2: 26 mmol/L (ref 22–32)
Calcium: 9 mg/dL (ref 8.9–10.3)
Chloride: 103 mmol/L (ref 101–111)
Creatinine, Ser: 7.32 mg/dL — ABNORMAL HIGH (ref 0.44–1.00)
GFR calc Af Amer: 6 mL/min — ABNORMAL LOW (ref >60)
GFR calc non Af Amer: 6 mL/min — ABNORMAL LOW (ref >60)
Glucose, Bld: 68 mg/dL (ref 65–99)
Potassium: 3.2 mmol/L — ABNORMAL LOW (ref 3.5–5.1)
Sodium: 143 mmol/L (ref 135–145)
Total Bilirubin: 0.6 mg/dL (ref 0.3–1.2)
Total Protein: 7.7 g/dL (ref 6.5–8.1)

## 2015-07-22 LAB — C DIFFICILE QUICK SCREEN W PCR REFLEX
C Diff antigen: POSITIVE — AB
C Diff toxin: NEGATIVE

## 2015-07-22 LAB — TROPONIN I: Troponin I: <0.03 ng/mL (ref <0.031)

## 2015-07-22 LAB — LIPASE, BLOOD: Lipase: 29 U/L (ref 11–51)

## 2015-07-22 LAB — CLOSTRIDIUM DIFFICILE BY PCR: Toxigenic C. Difficile by PCR: POSITIVE — AB

## 2015-07-22 MED ORDER — MORPHINE SULFATE (PF) 4 MG/ML IV SOLN
4.0000 mg | Freq: Once | INTRAVENOUS | Status: AC
  Administered 2015-07-22: 4 mg via INTRAVENOUS

## 2015-07-22 MED ORDER — METRONIDAZOLE IN NACL 5-0.79 MG/ML-% IV SOLN
500.0000 mg | Freq: Once | INTRAVENOUS | Status: AC
  Administered 2015-07-22: 500 mg via INTRAVENOUS
  Filled 2015-07-22: qty 100

## 2015-07-22 MED ORDER — INSULIN ASPART 100 UNIT/ML ~~LOC~~ SOLN
0.0000 [IU] | Freq: Three times a day (TID) | SUBCUTANEOUS | Status: DC
  Administered 2015-07-24 – 2015-07-25 (×2): 2 [IU] via SUBCUTANEOUS
  Filled 2015-07-22 (×2): qty 2

## 2015-07-22 MED ORDER — HEPARIN SODIUM (PORCINE) 5000 UNIT/ML IJ SOLN
5000.0000 [IU] | Freq: Two times a day (BID) | INTRAMUSCULAR | Status: DC
  Administered 2015-07-22 – 2015-07-23 (×2): 5000 [IU] via SUBCUTANEOUS
  Filled 2015-07-22 (×2): qty 1

## 2015-07-22 MED ORDER — METRONIDAZOLE IVPB CUSTOM
250.0000 mg | Freq: Three times a day (TID) | INTRAVENOUS | Status: DC
  Administered 2015-07-23 – 2015-07-24 (×3): 250 mg via INTRAVENOUS
  Filled 2015-07-22 (×8): qty 50

## 2015-07-22 MED ORDER — IOHEXOL 240 MG/ML SOLN: 25.0000 mL | INTRAMUSCULAR | Status: AC

## 2015-07-22 MED ORDER — PROMETHAZINE HCL 25 MG/ML IJ SOLN
12.5000 mg | Freq: Once | INTRAMUSCULAR | Status: AC
  Administered 2015-07-22: 12.5 mg via INTRAVENOUS
  Filled 2015-07-22: qty 1

## 2015-07-22 MED ORDER — MORPHINE SULFATE (PF) 4 MG/ML IV SOLN
4.0000 mg | Freq: Once | INTRAVENOUS | Status: AC
  Administered 2015-07-22: 4 mg via INTRAVENOUS
  Filled 2015-07-22: qty 1

## 2015-07-22 MED ORDER — MORPHINE SULFATE (PF) 2 MG/ML IV SOLN
2.0000 mg | INTRAVENOUS | Status: DC | PRN
  Administered 2015-07-22 – 2015-07-23 (×3): 2 mg via INTRAVENOUS
  Filled 2015-07-22 (×3): qty 1

## 2015-07-22 MED ORDER — SODIUM CHLORIDE 0.9 % IV BOLUS (SEPSIS)
500.0000 mL | Freq: Once | INTRAVENOUS | Status: AC
  Administered 2015-07-22: 500 mL via INTRAVENOUS

## 2015-07-22 MED ORDER — MORPHINE SULFATE (PF) 2 MG/ML IV SOLN
INTRAVENOUS | Status: AC
  Administered 2015-07-22: 4 mg via INTRAVENOUS
  Filled 2015-07-22: qty 2

## 2015-07-22 MED ORDER — ENOXAPARIN SODIUM 30 MG/0.3ML ~~LOC~~ SOLN: 30.0000 mg | SUBCUTANEOUS | Status: DC

## 2015-07-22 MED ORDER — METOCLOPRAMIDE HCL 5 MG/ML IJ SOLN
5.0000 mg | Freq: Four times a day (QID) | INTRAMUSCULAR | Status: DC | PRN
  Administered 2015-07-22 – 2015-07-23 (×3): 5 mg via INTRAVENOUS
  Filled 2015-07-22 (×3): qty 2

## 2015-07-22 NOTE — Progress Notes (Signed)
ANTICOAGULATION CONSULT NOTE - Initial Consult  Pharmacy Consult for Heparin/Lovenox  Indication: VTE prophylaxis  Allergies  Allergen Reactions  . Compazine [Prochlorperazine Edisylate] Anaphylaxis and Nausea And Vomiting  . Ace Inhibitors Swelling  . Ativan [Lorazepam] Other (See Comments)    Reaction:  Hallucinations and headaches  . Codeine Nausea And Vomiting  . Gabapentin Other (See Comments)    Reaction:  Unknown   . Losartan Other (See Comments)    Reaction:  Unknown   . Scopolamine Other (See Comments)    Reaction:  Unknown   . Zofran [Ondansetron Hcl] Other (See Comments)    Reaction:  hallucinations   . Oxycodone Anxiety  . Tape Rash    Patient Measurements: Height: 5' 5.5" (166.4 cm) Weight: 186 lb 8 oz (84.596 kg) IBW/kg (Calculated) : 58.15 Heparin Dosing Weight:   Vital Signs: Temp: 98.7 F (37.1 C) (10/30 1739) Temp Source: Oral (10/30 1739) BP: 165/69 mmHg (10/30 1739) Pulse Rate: 77 (10/30 1739)  Labs:  Recent Labs  07/20/15 1301 07/22/15 1145  HGB 13.7 13.5  HCT 43.5 42.9  PLT 228 231  CREATININE 5.44* 7.32*  TROPONINI  --  <0.03    Estimated Creatinine Clearance: 9.3 mL/min (by C-G formula based on Cr of 7.32).   Medical History: Past Medical History  Diagnosis Date  . Asthma   . Collagen vascular disease ([place])   . Diabetes mellitus without complication ([place])   . Hypertension   . Coronary artery disease     a. cath 2013: stenting to RCA (report not available); b. cath 2014: LM nl, pLAD 40%, mLAD nl, ost LCx 40%, mid LCx nl, pRCA 30% @ site of prior stent, mRCA 50%  . Diabetic neuropathy ([place])   . ESRD (end stage renal disease) on dialysis ([place])     M-W-F  . GERD (gastroesophageal reflux disease)   . Hx of pancreatitis 2015  . Myocardial infarction ([place])   . Pneumonia   . Mitral regurgitation     a. echo 10/2013: EF 62%, noWMA, mildly dilated LA, mild to mod MR/TR, GR1DD  . COPD (chronic obstructive pulmonary disease) ([place])   .  chronic diastolic CHF 123XX123  . dialysis 2006    Medications:  Scheduled:  . heparin subcutaneous  5,000 Units Subcutaneous Q12H  . insulin aspart  0-9 Units Subcutaneous TID WC  . metronidazole  250 mg Intravenous Q8H    Assessment: CrCl = 9.3 ml/min  Goal of Therapy:  DVT prophylaxis   Plan:  Lovenox 30 mg SQ Q24H originally ordered.  Will d/c lovenox and start heparin 5000 units SQ Q12H based on CrCl < 15 ml/min.   [name] D 07/22/2015,5:48 PM

## 2015-07-22 NOTE — ED Notes (Signed)
Pt presents to ER via EMS with c/o of right upper quadrant pain , with rebound tenderness. N/v x24 hrs. Pt states abd is more distended than usual/ denies hematemesis, and hematchozia.

## 2015-07-22 NOTE — ED Provider Notes (Signed)
[facility] Emergency Department Provider Note REMINDER - THIS NOTE IS NOT A FINAL MEDICAL RECORD UNTIL IT IS SIGNED. UNTIL THEN, THE CONTENT BELOW MAY REFLECT INFORMATION FROM A DOCUMENTATION TEMPLATE, NOT THE ACTUAL PATIENT VISIT. ____________________________________________  Time seen: Approximately 11:57 AM  I have reviewed the triage vital signs and the nursing notes.   HISTORY  Chief Complaint Abdominal Pain    HPI [name] is a 54 y.o. female history of end-stage renal disease on Monday Wednesday Friday dialysis, coronary disease, diabetes. Also history of pancreatitis.  Patient represents today, states she's had ongoing symptoms since being seen Friday with nausea, bone vomiting and numerous times nonbloody nonblack, and an occasional loose stool.  Sure she has severe upper abdominal pain on the right side, but she is piercing had her gallbladder and appendix both taken out.  Pain is 10 of 10, severe sharp and cramping intensity. Does not radiate to her chest or back. She states she is unable to keep any food down for the last 2 days and feels dehydrated.   Past Medical History  Diagnosis Date  . Asthma   . Collagen vascular disease ([place])   . Diabetes mellitus without complication ([place])   . Hypertension   . Coronary artery disease     a. cath 2013: stenting to RCA (report not available); b. cath 2014: LM nl, pLAD 40%, mLAD nl, ost LCx 40%, mid LCx nl, pRCA 30% @ site of prior stent, mRCA 50%  . Diabetic neuropathy ([place])   . ESRD (end stage renal disease) on dialysis ([place])     M-W-F  . GERD (gastroesophageal reflux disease)   . Hx of pancreatitis 2015  . Myocardial infarction ([place])   . Pneumonia   . Mitral regurgitation     a. echo 10/2013: EF 62%, noWMA, mildly dilated LA, mild to mod MR/TR, GR1DD  . COPD (chronic obstructive pulmonary disease) ([place])   . chronic diastolic CHF [id number]  . dialysis 2006    Patient Active Problem List   Diagnosis Date Noted  . Pneumonia 05/21/2015  . Hypoglycemia 04/24/2015  . Unresponsiveness 04/24/2015  . Bradycardia 04/24/2015  . Hypothermia 04/24/2015  . Acute respiratory failure ([place]) 04/24/2015  . Acute diastolic CHF (congestive heart failure) ([place]) 04/05/2015  . Diabetic gastroparesis ([place]) 04/05/2015  . Hypokalemia 04/05/2015  . Generalized weakness 04/05/2015  . Acute pulmonary edema ([place]) 04/03/2015  . Nausea and vomiting 04/03/2015  . Hypoglycemia associated with diabetes ([place]) 04/03/2015  . Anemia of chronic disease 04/03/2015  . Secondary hyperparathyroidism ([place]) 04/03/2015  . Pressure ulcer 04/02/2015  . Acute respiratory failure with hypoxia ([place]) 04/01/2015  . Adjustment disorder with anxiety 03/14/2015  . Somatic symptom disorder, mild 03/08/2015  . Coronary artery disease involving native coronary artery of native heart without angina pectoris   . Nausea & vomiting 03/06/2015  . Abdominal pain 03/06/2015  . DM (diabetes mellitus) ([place]) 03/06/2015  . HTN (hypertension) 03/06/2015  . Gastroparesis 02/24/2015  . Pleural effusion 02/19/2015  . HCAP (healthcare-associated pneumonia) 02/19/2015  . End-stage renal disease on hemodialysis ([place]) 02/19/2015    Past Surgical History  Procedure Laterality Date  . Cholecystectomy    . Appendectomy    . Abdominal hysterectomy    . Dialysis fistula creation Left     upper arm  . Esophagogastroduodenoscopy N/A 03/08/2015    Procedure: ESOPHAGOGASTRODUODENOSCOPY (EGD);  Surgeon: Manya Silvas, MD;  Location: [facility] ENDOSCOPY;  Service: Endoscopy;  Laterality: N/A;    Current Outpatient  Rx  Name  Route  Sig  Dispense  Refill  . albuterol (PROVENTIL HFA;VENTOLIN HFA) 108 (90 BASE) MCG/ACT inhaler      Inhale 4-6 puffs by mouth every 4 hours as needed for wheezing, cough, and/or shortness of breath Patient taking differently: Inhale 4-6 puffs into the lungs every 4 (four) hours as needed for wheezing or shortness of  breath (or cough).    1 Inhaler   1   . albuterol (PROVENTIL) (2.5 MG/3ML) 0.083% nebulizer solution   Nebulization   Take 2.5 mg by nebulization every 4 (four) hours as needed for wheezing or shortness of breath.         Marland Kitchen amLODipine (NORVASC) 10 MG tablet   Oral   Take 10 mg by mouth daily.         Marland Kitchen aspirin EC 81 MG tablet   Oral   Take 81 mg by mouth daily.         Marland Kitchen atorvastatin (LIPITOR) 20 MG tablet   Oral   Take 20 mg by mouth every evening.         . B Complex-C-Biotin-E-FA (RENATABS PO)   Oral   Take 1 tablet by mouth daily.         . carvedilol (COREG) 25 MG tablet   Oral   Take 25 mg by mouth 2 (two) times daily.         . cetirizine (ZYRTEC) 10 MG tablet   Oral   Take 10 mg by mouth daily.         . citalopram (CELEXA) 20 MG tablet   Oral   Take 20 mg by mouth daily.         . clopidogrel (PLAVIX) 75 MG tablet   Oral   Take 75 mg by mouth daily.         Marland Kitchen docusate sodium (COLACE) 100 MG capsule   Oral   Take 1 capsule (100 mg total) by mouth 2 (two) times daily.   60 capsule   0   . guaiFENesin-dextromethorphan (ROBITUSSIN DM) 100-10 MG/5ML syrup   Oral   Take 10 mLs by mouth every 6 (six) hours as needed for cough.   118 mL   0   . HYDROcodone-acetaminophen (NORCO/VICODIN) 5-325 MG per tablet   Oral   Take 1 tablet by mouth 3 (three) times daily as needed for moderate pain.          Marland Kitchen levofloxacin (LEVAQUIN) 750 MG tablet   Oral   Take 750 mg by mouth every Monday, Wednesday, and Friday.         . lipase/protease/amylase (CREON) 12000 UNITS CPEP capsule   Oral   Take 36,000 Units by mouth 3 (three) times daily with meals.          . metoCLOPramide (REGLAN) 5 MG tablet   Oral   Take 1 tablet (5 mg total) by mouth 4 (four) times daily -  before meals and at bedtime.   40 tablet   0   . nitroGLYCERIN (NITROSTAT) 0.4 MG SL tablet   Sublingual   Place 0.4 mg under the tongue every 5 (five) minutes x 3 doses as  needed for chest pain.          Marland Kitchen ondansetron (ZOFRAN ODT) 4 MG disintegrating tablet   Oral   Take 1 tablet (4 mg total) by mouth every 8 (eight) hours as needed for nausea or vomiting.   20 tablet   0   .  pantoprazole (PROTONIX) 40 MG tablet   Oral   Take 40 mg by mouth daily.         . prochlorperazine (COMPAZINE) 5 MG tablet   Oral   Take 1 tablet (5 mg total) by mouth every 6 (six) hours as needed for nausea or vomiting.   30 tablet   1   . promethazine (PHENERGAN) 12.5 MG tablet   Oral   Take 12.5 mg by mouth every 6 (six) hours as needed for nausea or vomiting.         . promethazine (PHENERGAN) 25 MG suppository   Rectal   Place 1 suppository (25 mg total) rectally every 8 (eight) hours as needed for nausea or vomiting.   12 each   0   . ranitidine (ZANTAC) 150 MG tablet   Oral   Take 150 mg by mouth daily.          . ranolazine (RANEXA) 1000 MG SR tablet   Oral   Take 1,000 mg by mouth 2 (two) times daily.         . sevelamer carbonate (RENVELA) 800 MG tablet   Oral   Take 800 mg by mouth 3 (three) times daily with meals.          . sucralfate (CARAFATE) 1 G tablet   Oral   Take 1 tablet (1 g total) by mouth 4 (four) times daily -  with meals and at bedtime.   120 tablet   0   . Vitamin D, Cholecalciferol, 400 UNITS TABS   Oral   Take 400 Units by mouth daily.            Allergies Ace inhibitors; Ativan; Codeine; Compazine; Gabapentin; Losartan; Scopolamine; Zofran; Oxycodone; and Tape  Family History  Problem Relation Age of Onset  . Kidney disease Mother   . Diabetes Mother     Social History Social History  Substance Use Topics  . Smoking status: Current Some Day Smoker -- 0.50 packs/day    Types: Cigarettes    Last Attempt to Quit: 02/13/2015  . Smokeless tobacco: Never Used  . Alcohol Use: No    Review of Systems Constitutional: No fever/chills Eyes: No visual changes. ENT: No sore throat. Cardiovascular: Denies  chest pain. Respiratory: Denies shortness of breath. Gastrointestinal: No diarrhea.  No constipation. Occasional loose stool over the last day. Genitourinary: Negative for dysuria. Musculoskeletal: Negative for back pain. Skin: Negative for rash. Neurological: Negative for headaches, focal weakness or numbness.  10-point ROS otherwise negative.  Not attend dialysis on Friday. ____________________________________________   PHYSICAL EXAM:  VITAL SIGNS: ED Triage Vitals  Enc Vitals Group     BP 07/22/15 1102 168/77 mmHg     Pulse Rate 07/22/15 1102 83     Resp 07/22/15 1102 26     Temp 07/22/15 1102 98.3 F (36.8 C)     Temp Source 07/22/15 1102 Oral     SpO2 07/22/15 1102 98 %     Weight 07/22/15 1102 186 lb 8 oz (84.596 kg)     Height 07/22/15 1102 5' 5.5" (1.664 m)     Head Cir --      Peak Flow --      Pain Score 07/22/15 1054 7     Pain Loc --      Pain Edu? --      Excl. in [place]? --    Constitutional: Alert and oriented. In no acute distress, but does appear in mild pain wincing  in the bed. Eyes: Conjunctivae are normal. PERRL. EOMI. Head: Atraumatic. Nose: No congestion/rhinnorhea. Mouth/Throat: Mucous membranes are quite dry and lips are cracked.  Oropharynx non-erythematous. Neck: No stridor.   Cardiovascular: Normal rate, regular rhythm. Grossly normal heart sounds.  Good peripheral circulation. Respiratory: Normal respiratory effort.  No retractions. Lungs CTAB. Gastrointestinal: Soft and nontender in the lower abdomen, the patient has significant pain with mild guarding and minimal rebound tenderness in the right upper quadrant. No distention. No abdominal bruits. No CVA tenderness. Musculoskeletal: No lower extremity tenderness nor edema.  No joint effusions. Neurologic:  Normal speech and language. No gross focal neurologic deficits are appreciated. Skin:  Skin is warm, dry and intact. No rash noted. Psychiatric: Mood and affect are normal. Speech and behavior  are normal.  ____________________________________________   LABS (all labs ordered are listed, but only abnormal results are displayed)  Labs Reviewed  COMPREHENSIVE METABOLIC PANEL - Abnormal; Notable for the following:    Potassium 3.2 (*)    BUN 38 (*)    Creatinine, Ser 7.32 (*)    Albumin 2.8 (*)    GFR calc non Af Amer 6 (*)    GFR calc Af Amer 6 (*)    All other components within normal limits  CBC - Abnormal; Notable for the following:    MCHC 31.5 (*)    RDW 21.0 (*)    All other components within normal limits  C DIFFICILE QUICK SCREEN W PCR REFLEX  CLOSTRIDIUM DIFFICILE BY PCR  LIPASE, BLOOD  TROPONIN I   ____________________________________________  EKG  Reviewed and interpreted by me EKG performed at 1158 Normal sinus rhythm, heart rate 78 QTC 515 q waves noted in anterior septal distribution No ischemic T-wave abnormalities are noted though there is some artifact noted in V5 and V6, Reviewed and interpreted as normal sinus rhythm, possible old anteroseptal infarct, no acute ST elevation or obvious ischemic changes noted ____________________________________________  RADIOLOGY  IMPRESSION: 1. No evidence of bowel obstruction. No intraperitoneal free air. 2. Degenerate of oral contrast within the stomach and proximal small bowel. This is likely a timing issue. Cannot exclude gastric paresis outlet obstruction although no obstructing lesion identified. Thickening through the pylorus is felt to be physiologic. 3. Bilateral pleural effusions which are moderate to large on the RIGHT are not changed from prior. 4. Anasarca of the soft tissues. ____________________________________________   PROCEDURES  Procedure(s) performed: None  Critical Care performed: No  ____________________________________________   INITIAL IMPRESSION / ASSESSMENT AND PLAN / ED COURSE  Pertinent labs & imaging results that were available during my care of the patient were  reviewed by me and considered in my medical decision making (see chart for details).  Patient returns with ongoing complaint of nausea, vomiting, and focal right upper quadrant pain. She is afebrile and is status post cholecystectomy. Her exam does demonstrate possible peritonitis in the right upper quadrant distribution. I plan to give pain control, hydrate gently given her dialysis but she does appear dehydrated, also will obtain labs, EKG is reassuring for no acute ischemia given her dialysis status it is possible this could still represent coronary disease and will send a troponin. In addition, I'll obtain CT imaging without contrast the abdomen and pelvis to further evaluate for the cause of her pain.  ----------------------------------------- 3:47 PM on 07/22/2015 -----------------------------------------  Patient C. difficile test is positive, I have initiated IV Flagyl, IV fluids. The patient reports significant nausea that is ongoing, we'll repeat Phenergan dosing. Her pain is  improved, but she does appear quite nauseated and given a diagnosis of Clostridium difficile in the setting of coming from a nursing facility, I am concerned for the possibility the patient may worsen or require parenthood malnutrition/fluid hydration given the extent of her nausea. We'll admit the patient to hospital for ongoing care and treatment. Called and discussed with nephrology, will see and perform consultation with patient today.  ____________________________________________   FINAL CLINICAL IMPRESSION(S) / ED DIAGNOSES  Final diagnoses:  Clostridium difficile colitis      Delman Kitten, MD 07/22/15 1549

## 2015-07-22 NOTE — H&P (Addendum)
[facility] NAME: [name]    MR#:  [id number]  DATE OF BIRTH:  1961/09/04  DATE OF ADMISSION:  07/22/2015  PRIMARY CARE PHYSICIAN: Ellamae Sia, MD   REQUESTING/REFERRING PHYSICIAN: Quale  CHIEF COMPLAINT:   Chief Complaint  Patient presents with  . Abdominal Pain    HISTORY OF PRESENT ILLNESS: [name]  is a 54 y.o. female with a known history of asthma, diabetes, hypertension, coronary artery disease, end-stage renal disease on hemodialysis, COPD, chronic diastolic CHF, recently diagnosed with C. difficile but was considered is anterior in and of September and was given some antibiotics to finish 14 days at home. She took it for one week and then started taking probiotic and stop taking antibiotic on her own as she was feeling slightly better. Now for last few days she started having watery diarrhea again and feeling nauseous and having abdominal pain so came to emergency room. She is again tested positive for toxigenic C. difficile but negative for toxins. She is feeling nauseated and having severe abdominal pain requiring IV pain medications multiple times in ER so she is given to hospitalist team after CT of the abdomen was reported as negative for any acute findings.  PAST MEDICAL HISTORY:   Past Medical History  Diagnosis Date  . Asthma   . Collagen vascular disease ([place])   . Diabetes mellitus without complication ([place])   . Hypertension   . Coronary artery disease     a. cath 2013: stenting to RCA (report not available); b. cath 2014: LM nl, pLAD 40%, mLAD nl, ost LCx 40%, mid LCx nl, pRCA 30% @ site of prior stent, mRCA 50%  . Diabetic neuropathy ([place])   . ESRD (end stage renal disease) on dialysis ([place])     M-W-F  . GERD (gastroesophageal reflux disease)   . Hx of pancreatitis 2015  . Myocardial infarction ([place])   . Pneumonia   . Mitral regurgitation     a. echo 10/2013: EF 62%, noWMA, mildly dilated LA,  mild to mod MR/TR, GR1DD  . COPD (chronic obstructive pulmonary disease) ([place])   . chronic diastolic CHF [id number]  . dialysis 2006    PAST SURGICAL HISTORY:  Past Surgical History  Procedure Laterality Date  . Cholecystectomy    . Appendectomy    . Abdominal hysterectomy    . Dialysis fistula creation Left     upper arm  . Esophagogastroduodenoscopy N/A 03/08/2015    Procedure: ESOPHAGOGASTRODUODENOSCOPY (EGD);  Surgeon: Manya Silvas, MD;  Location: [facility] ENDOSCOPY;  Service: Endoscopy;  Laterality: N/A;    SOCIAL HISTORY:  Social History  Substance Use Topics  . Smoking status: Current Some Day Smoker -- 0.50 packs/day    Types: Cigarettes    Last Attempt to Quit: 02/13/2015  . Smokeless tobacco: Never Used  . Alcohol Use: No    FAMILY HISTORY:  Family History  Problem Relation Age of Onset  . Kidney disease Mother   . Diabetes Mother     DRUG ALLERGIES:  Allergies  Allergen Reactions  . Compazine [Prochlorperazine Edisylate] Anaphylaxis and Nausea And Vomiting  . Ace Inhibitors Swelling  . Ativan [Lorazepam] Other (See Comments)    Reaction:  Hallucinations and headaches  . Codeine Nausea And Vomiting  . Gabapentin Other (See Comments)    Reaction:  Unknown   . Losartan Other (See Comments)    Reaction:  Unknown   . Scopolamine Other (See Comments)  Reaction:  Unknown   . Zofran [Ondansetron Hcl] Other (See Comments)    Reaction:  hallucinations   . Oxycodone Anxiety  . Tape Rash    REVIEW OF SYSTEMS:   CONSTITUTIONAL: No fever, fatigue or weakness.  EYES: No blurred or double vision.  EARS, NOSE, AND THROAT: No tinnitus or ear pain.  RESPIRATORY: No cough, shortness of breath, wheezing or hemoptysis.  CARDIOVASCULAR: No chest pain, orthopnea, edema.  GASTROINTESTINAL: positive nausea, vomiting, diarrhea and abdominal pain.  GENITOURINARY: No dysuria, hematuria.  ENDOCRINE: No polyuria, nocturia,  HEMATOLOGY: No anemia, easy bruising or  bleeding SKIN: No rash or lesion. MUSCULOSKELETAL: No joint pain or arthritis.   NEUROLOGIC: No tingling, numbness, weakness.  PSYCHIATRY: No anxiety or depression.   MEDICATIONS AT HOME:  Prior to Admission medications   Medication Sig Start Date End Date Taking? Authorizing Provider  albuterol (PROVENTIL HFA;VENTOLIN HFA) 108 (90 BASE) MCG/ACT inhaler Inhale 4-6 puffs by mouth every 4 hours as needed for wheezing, cough, and/or shortness of breath Patient taking differently: Inhale 4-6 puffs into the lungs every 4 (four) hours as needed for wheezing or shortness of breath (or cough).  02/17/15   Hinda Kehr, MD  albuterol (PROVENTIL) (2.5 MG/3ML) 0.083% nebulizer solution Take 2.5 mg by nebulization every 4 (four) hours as needed for wheezing or shortness of breath.    Historical Provider, MD  amLODipine (NORVASC) 10 MG tablet Take 10 mg by mouth daily.    Historical Provider, MD  aspirin EC 81 MG tablet Take 81 mg by mouth daily.    Historical Provider, MD  atorvastatin (LIPITOR) 20 MG tablet Take 20 mg by mouth every evening.    Historical Provider, MD  B Complex-C-Biotin-E-FA (RENATABS PO) Take 1 tablet by mouth daily.    Historical Provider, MD  carvedilol (COREG) 25 MG tablet Take 25 mg by mouth 2 (two) times daily.    Historical Provider, MD  cetirizine (ZYRTEC) 10 MG tablet Take 10 mg by mouth daily.    Historical Provider, MD  citalopram (CELEXA) 20 MG tablet Take 20 mg by mouth daily.    Historical Provider, MD  clopidogrel (PLAVIX) 75 MG tablet Take 75 mg by mouth daily.    Historical Provider, MD  docusate sodium (COLACE) 100 MG capsule Take 1 capsule (100 mg total) by mouth 2 (two) times daily. 06/07/15   Srikar Sudini, MD  guaiFENesin-dextromethorphan (ROBITUSSIN DM) 100-10 MG/5ML syrup Take 10 mLs by mouth every 6 (six) hours as needed for cough. 05/24/15   Gladstone Lighter, MD  HYDROcodone-acetaminophen (NORCO/VICODIN) 5-325 MG per tablet Take 1 tablet by mouth 3 (three) times  daily as needed for moderate pain.     Historical Provider, MD  levofloxacin (LEVAQUIN) 750 MG tablet Take 750 mg by mouth every Monday, Wednesday, and Friday.    Historical Provider, MD  lipase/protease/amylase (CREON) 12000 UNITS CPEP capsule Take 36,000 Units by mouth 3 (three) times daily with meals.     Historical Provider, MD  metoCLOPramide (REGLAN) 5 MG tablet Take 1 tablet (5 mg total) by mouth 4 (four) times daily -  before meals and at bedtime. 02/24/15   [name], MD  nitroGLYCERIN (NITROSTAT) 0.4 MG SL tablet Place 0.4 mg under the tongue every 5 (five) minutes x 3 doses as needed for chest pain.     Historical Provider, MD  ondansetron (ZOFRAN ODT) 4 MG disintegrating tablet Take 1 tablet (4 mg total) by mouth every 8 (eight) hours as needed for nausea or vomiting. 06/14/15  Earleen Newport, MD  pantoprazole (PROTONIX) 40 MG tablet Take 40 mg by mouth daily.    Historical Provider, MD  prochlorperazine (COMPAZINE) 5 MG tablet Take 1 tablet (5 mg total) by mouth every 6 (six) hours as needed for nausea or vomiting. 06/14/15 06/13/16  Earleen Newport, MD  promethazine (PHENERGAN) 12.5 MG tablet Take 12.5 mg by mouth every 6 (six) hours as needed for nausea or vomiting.    Historical Provider, MD  promethazine (PHENERGAN) 25 MG suppository Place 1 suppository (25 mg total) rectally every 8 (eight) hours as needed for nausea or vomiting. 06/07/15   [name], MD  ranitidine (ZANTAC) 150 MG tablet Take 150 mg by mouth daily.     Historical Provider, MD  ranolazine (RANEXA) 1000 MG SR tablet Take 1,000 mg by mouth 2 (two) times daily.    Historical Provider, MD  sevelamer carbonate (RENVELA) 800 MG tablet Take 800 mg by mouth 3 (three) times daily with meals.     Historical Provider, MD  sucralfate (CARAFATE) 1 G tablet Take 1 tablet (1 g total) by mouth 4 (four) times daily -  with meals and at bedtime. 02/24/15   [name], MD  Vitamin D, Cholecalciferol, 400 UNITS TABS Take 400  Units by mouth daily.     Historical Provider, MD      PHYSICAL EXAMINATION:   VITAL SIGNS: Blood pressure 172/71, pulse 75, temperature 98.3 F (36.8 C), temperature source Oral, resp. rate 18, height 5' 5.5" (1.664 m), weight 84.596 kg (186 lb 8 oz), SpO2 88 %.  GENERAL:  54 y.o.-year-old patient lying in the bed with acute distress due to pain in abdomen.  EYES: Pupils equal, round, reactive to light and accommodation. No scleral icterus. Extraocular muscles intact.  HEENT: Head atraumatic, normocephalic. Oropharynx and nasopharynx clear.  NECK:  Supple, no jugular venous distention. No thyroid enlargement, no tenderness.  LUNGS: Normal breath sounds bilaterally, no wheezing, rales,rhonchi or crepitation. No use of accessory muscles of respiration.  CARDIOVASCULAR: S1, S2 normal. No murmurs, rubs, or gallops.  ABDOMEN: Soft,mild tender, nondistended. Bowel sounds present. No organomegaly or mass.  EXTREMITIES: No pedal edema, cyanosis, or clubbing.  NEUROLOGIC: Cranial nerves II through XII are intact. Muscle strength 5/5 in all extremities. Sensation intact. Gait not checked.  PSYCHIATRIC: The patient is alert and oriented x 3.  SKIN: No obvious rash, lesion, or ulcer.   LABORATORY PANEL:   CBC  Recent Labs Lab 07/20/15 1301 07/22/15 1145  WBC 6.5 5.5  HGB 13.7 13.5  HCT 43.5 42.9  PLT 228 231  MCV 89.1 88.4  MCH 28.0 27.8  MCHC 31.4* 31.5*  RDW 21.2* 21.0*  LYMPHSABS 1.1  --   MONOABS 0.5  --   EOSABS 0.2  --   BASOSABS 0.4*  --    ------------------------------------------------------------------------------------------------------------------  Chemistries   Recent Labs Lab 07/20/15 1301 07/22/15 1145  NA 138 143  K 4.2 3.2*  CL 102 103  CO2 23 26  GLUCOSE 91 68  BUN 30* 38*  CREATININE 5.44* 7.32*  CALCIUM 9.4 9.0  AST 37 27  ALT 19 17  ALKPHOS 115 114  BILITOT 0.7 0.6    ------------------------------------------------------------------------------------------------------------------ estimated creatinine clearance is 9.3 mL/min (by C-G formula based on Cr of 7.32). ------------------------------------------------------------------------------------------------------------------ No results for input(s): TSH, T4TOTAL, T3FREE, THYROIDAB in the last 72 hours.  Invalid input(s): FREET3   Coagulation profile No results for input(s): INR, PROTIME in the last 168 hours. ------------------------------------------------------------------------------------------------------------------- No results for input(s):  DDIMER in the last 72 hours. -------------------------------------------------------------------------------------------------------------------  Cardiac Enzymes  Recent Labs Lab 07/22/15 1145  TROPONINI <0.03   ------------------------------------------------------------------------------------------------------------------ Invalid input(s): POCBNP  ---------------------------------------------------------------------------------------------------------------  Urinalysis    Component Value Date/Time   COLORURINE YELLOW* 07/20/2015 1633   COLORURINE [place] 09/15/2014 0947   APPEARANCEUR HAZY* 07/20/2015 1633   APPEARANCEUR Clear 09/15/2014 0947   LABSPEC 1.009 07/20/2015 1633   LABSPEC 1.008 09/15/2014 0947   PHURINE 8.0 07/20/2015 1633   PHURINE 9.0 09/15/2014 0947   GLUCOSEU NEGATIVE 07/20/2015 1633   GLUCOSEU 150 mg/dL 09/15/2014 [place] 07/20/2015 1633   HGBUR Negative 09/15/2014 [place] 07/20/2015 1633   BILIRUBINUR Negative 09/15/2014 [place] 07/20/2015 1633   KETONESUR Negative 09/15/2014 [place] 07/20/2015 1633   PROTEINUR 100 mg/dL 09/15/2014 0947   UROBILINOGEN 0.2 06/30/2010 2130   NITRITE NEGATIVE 07/20/2015 1633   NITRITE Negative 09/15/2014 [place] 07/20/2015 1633   LEUKOCYTESUR Negative 09/15/2014 0947     RADIOLOGY: Ct Abdomen Pelvis Wo Contrast  07/22/2015  CLINICAL DATA:  RIGHT upper quadrant pain. Rebound tenderness. Pain for 24 hours. EXAM: CT ABDOMEN AND PELVIS WITHOUT CONTRAST TECHNIQUE: Multidetector CT imaging of the abdomen and pelvis was performed following the standard protocol without IV contrast. COMPARISON:  CT 05/07/2015 acute FINDINGS: Lower chest: Moderate RIGHT pleural effusion is similar to comparison exam. Small LEFT effusion is similar. Bibasilar atelectasis not changed. Passive atelectasis the lung bases. Hepatobiliary: No focal hepatic lesions noncontrast exam. Post cholecystectomy. Pancreas: Pancreas is normal. No ductal dilatation. No pancreatic inflammation. Spleen: Normal spleen Adrenals/urinary tract: Adrenal glands and kidneys are normal. The ureters and bladder normal. Stomach/Bowel: The stomach is distended with oral contrast. There is thickening through the pylorus. The duodenum is normal. The proximal small bowel is normal caliber with normal mucosal pattern. The distal small bowel is not opacified with the oral contrast but there is no caliber change suggesting delay in oral transit without obstruction. Terminal ileum is normal. Appendix not identified. The ascending, transverse, and descending colon are collapsed. There is stool in the rectosigmoid colon. Vascular/Lymphatic: Abdominal aorta is normal caliber with atherosclerotic calcification. There is no retroperitoneal or periportal lymphadenopathy. No pelvic lymphadenopathy. Reproductive: Post hysterectomy. Musculoskeletal: No aggressive osseous lesion. Other: Mild anasarca of the soft tissues. No significant free fluid the abdomen pelvis. No intraperitoneal free air. IMPRESSION: 1. No evidence of bowel obstruction.  No intraperitoneal free air. 2. Degenerate of oral contrast within the stomach and proximal small bowel. This is likely a  timing issue. Cannot exclude gastric paresis outlet obstruction although no obstructing lesion identified. Thickening through the pylorus is felt to be physiologic. 3. Bilateral pleural effusions which are moderate to large on the RIGHT are not changed from prior. 4. Anasarca of the soft tissues. Electronically Signed   By: Suzy Bouchard M.D.   On: 07/22/2015 15:28    EKG: Orders placed or performed during the hospital encounter of 07/22/15  . ED EKG  . ED EKG    IMPRESSION AND PLAN:  * C. difficile diarrhea  We'll give IV Flagyl, as patient is not able to tolerate oral because of nausea.  We'll give supportive care for pain and nausea for now.  She received 500 mL bolus in the ER, and as she is a dialysis patient I will not start on continuous IV fluid at this time.    And the nephrologist can adjust the filtration rate during  the dialysis depending on her fluid volume status.  * ESRD on HD   HD per Nephrologist.  * Diabetes  continue insulin sliding scale coverage.  * History of coronary artery disease  Continue cardiac medications.  * Hypertension  Continue home medications.  * Smoking  Counseled to quit smoking for 4 minutes and offered nicotine patch.  All the records are reviewed and case discussed with ED provider. Management plans discussed with the patient, family and they are in agreement.  CODE STATUS: Full   TOTAL TIME TAKING CARE OF THIS PATIENT: 50 minutes.    Vaughan Basta M.D on 07/22/2015   Between 7am to 6pm - Pager - [phone]  After 6pm go to [email or web address] - password [email or web address] [facility]  Office  [phone]  CC: Primary care physician; Ellamae Sia, MD   Note: This dictation was prepared with Dragon dictation along with smaller phrase technology. Any transcriptional errors that result from this process are unintentional.

## 2015-07-22 NOTE — Progress Notes (Signed)
[facility]  ROUNDING NOTE   Subjective:   [name]  Came back to ED today where she was found to have C. Diff colitis.  She has been started on metronidazole.   Objective:  Vital signs in last 24 hours:  Temp:  [98.3 F (36.8 C)] 98.3 F (36.8 C) (10/30 1102) Pulse Rate:  [32-84] 75 (10/30 1530) Resp:  [14-26] 18 (10/30 1530) BP: (167-181)/(71-84) 172/71 mmHg (10/30 1530) SpO2:  [80 %-98 %] 88 % (10/30 1530) Weight:  [84.596 kg (186 lb 8 oz)] 84.596 kg (186 lb 8 oz) (10/30 1102)  Weight change:  Filed Weights   07/22/15 1102  Weight: 84.596 kg (186 lb 8 oz)    Intake/Output:     Intake/Output this shift:     Physical Exam: General: NAD, cachectic  Head: Normocephalic, atraumatic. Moist oral mucosal membranes  Eyes: Anicteric, PERRL  Neck: Supple, trachea midline  Lungs:  Clear to auscultation  Heart: Regular rate and rhythm  Abdomen:  Soft, left lower quadrant abdominal pain.   Extremities: noeripheral edema.  Neurologic: Nonfocal, moving all four extremities  Skin: No lesions  Access: Left arm AVG    Basic Metabolic Panel:  Recent Labs Lab 07/20/15 1301 07/22/15 1145  NA 138 143  K 4.2 3.2*  CL 102 103  CO2 23 26  GLUCOSE 91 68  BUN 30* 38*  CREATININE 5.44* 7.32*  CALCIUM 9.4 9.0    Liver Function Tests:  Recent Labs Lab 07/20/15 1301 07/22/15 1145  AST 37 27  ALT 19 17  ALKPHOS 115 114  BILITOT 0.7 0.6  PROT 7.2 7.7  ALBUMIN 2.6* 2.8*    Recent Labs Lab 07/20/15 1301 07/22/15 1145  LIPASE 26 29   No results for input(s): AMMONIA in the last 168 hours.  CBC:  Recent Labs Lab 07/20/15 1301 07/22/15 1145  WBC 6.5 5.5  NEUTROABS 4.2  --   HGB 13.7 13.5  HCT 43.5 42.9  MCV 89.1 88.4  PLT 228 231    Cardiac Enzymes:  Recent  Labs Lab 07/22/15 1145  TROPONINI <0.03    BNP: Invalid input(s): POCBNP  CBG: No results for input(s): GLUCAP in the last 168 hours.  Microbiology: Results for orders placed or performed during the hospital encounter of 07/22/15  C difficile quick scan w PCR reflex     Status: Abnormal   Collection Time: 07/22/15 12:40 PM  Result Value Ref Range Status   C Diff antigen POSITIVE (A) NEGATIVE Final    Comment: REPORTED TO SHANNON PATCH AT 1539PM BY TB   C Diff toxin NEGATIVE NEGATIVE Final   C Diff interpretation   Final    Positive for toxigenic C. difficile, active toxin production not detected. Patient has toxigenic C. difficile organisms present in the bowel, but toxin was not detected. The patient may be a carrier or the level of toxin in the sample was below the limit  of detection. This information should be used in conjunction with the patient's clinical history when deciding on possible therapy.   Clostridium Difficile by PCR     Status: Abnormal   Collection Time: 07/22/15 12:40 PM  Result Value Ref Range Status   Toxigenic C Difficile by pcr POSITIVE (A) NEGATIVE Final    Comment: CRITICAL RESULT CALLED TO, READ BACK BY AND VERIFIED WITH: Larene Beach  PATCH ON 07/22/15 AT 1539 BY TB     Coagulation Studies: No results for input(s): LABPROT, INR in the last 72 hours.  Urinalysis:  Recent Labs  07/20/15 1633  COLORURINE YELLOW*  LABSPEC 1.009  PHURINE 8.0  GLUCOSEU NEGATIVE  HGBUR NEGATIVE  BILIRUBINUR NEGATIVE  KETONESUR NEGATIVE  PROTEINUR NEGATIVE  NITRITE NEGATIVE  LEUKOCYTESUR NEGATIVE      Imaging: Ct Abdomen Pelvis Wo Contrast  07/22/2015  CLINICAL DATA:  RIGHT upper quadrant pain. Rebound tenderness. Pain for 24 hours. EXAM: CT ABDOMEN AND PELVIS WITHOUT CONTRAST TECHNIQUE: Multidetector CT imaging of the abdomen and pelvis was performed following the standard protocol without IV contrast. COMPARISON:  CT 05/07/2015 acute FINDINGS: Lower chest:  Moderate RIGHT pleural effusion is similar to comparison exam. Small LEFT effusion is similar. Bibasilar atelectasis not changed. Passive atelectasis the lung bases. Hepatobiliary: No focal hepatic lesions noncontrast exam. Post cholecystectomy. Pancreas: Pancreas is normal. No ductal dilatation. No pancreatic inflammation. Spleen: Normal spleen Adrenals/urinary tract: Adrenal glands and kidneys are normal. The ureters and bladder normal. Stomach/Bowel: The stomach is distended with oral contrast. There is thickening through the pylorus. The duodenum is normal. The proximal small bowel is normal caliber with normal mucosal pattern. The distal small bowel is not opacified with the oral contrast but there is no caliber change suggesting delay in oral transit without obstruction. Terminal ileum is normal. Appendix not identified. The ascending, transverse, and descending colon are collapsed. There is stool in the rectosigmoid colon. Vascular/Lymphatic: Abdominal aorta is normal caliber with atherosclerotic calcification. There is no retroperitoneal or periportal lymphadenopathy. No pelvic lymphadenopathy. Reproductive: Post hysterectomy. Musculoskeletal: No aggressive osseous lesion. Other: Mild anasarca of the soft tissues. No significant free fluid the abdomen pelvis. No intraperitoneal free air. IMPRESSION: 1. No evidence of bowel obstruction.  No intraperitoneal free air. 2. Degenerate of oral contrast within the stomach and proximal small bowel. This is likely a timing issue. Cannot exclude gastric paresis outlet obstruction although no obstructing lesion identified. Thickening through the pylorus is felt to be physiologic. 3. Bilateral pleural effusions which are moderate to large on the RIGHT are not changed from prior. 4. Anasarca of the soft tissues. Electronically Signed   By: Suzy Bouchard M.D.   On: 07/22/2015 15:28     Medications:   . metronidazole 500 mg (07/22/15 1603)       Assessment/  Plan:  [name] with long standing DM, HTN, hyperlipidemia, retinopathy, severe gastroparesis , GI bleed.   [address] MWF  1. End Stage Renal Disease: missed dialysis on Friday. However with hypovolemia due to GI losses.  - no acute indication for dialysis. Plan for dialysis tomorrow.   2. C. Diff colitis A04.7: empiric metronidazole.  - continue supportive care.   3. Secondary Hyperparathyroidism: phos 5.6, PTH 93 as outpatient.  - will hold binders for now.   4. Anemia of chronic kidney disease: hemoglobin 13.5. Most likely due to volume contraction.  - hold epo.    LOS:  Lavonia Dana 10/30/20164:34 PM

## 2015-07-22 NOTE — ED Notes (Signed)
Notified MD of positive c-diff

## 2015-07-23 ENCOUNTER — Inpatient Hospital Stay: Payer: Medicare Other

## 2015-07-23 LAB — GLUCOSE, CAPILLARY
Glucose-Capillary: 28 mg/dL — CL (ref 65–99)
Glucose-Capillary: 26 mg/dL — CL (ref 65–99)
Glucose-Capillary: 79 mg/dL (ref 65–99)
Glucose-Capillary: 89 mg/dL (ref 65–99)
Glucose-Capillary: 41 mg/dL — CL (ref 65–99)
Glucose-Capillary: 61 mg/dL — ABNORMAL LOW (ref 65–99)
Glucose-Capillary: 52 mg/dL — ABNORMAL LOW (ref 65–99)
Glucose-Capillary: 86 mg/dL (ref 65–99)
Glucose-Capillary: 120 mg/dL — ABNORMAL HIGH (ref 65–99)
Glucose-Capillary: 127 mg/dL — ABNORMAL HIGH (ref 65–99)
Glucose-Capillary: 107 mg/dL — ABNORMAL HIGH (ref 65–99)

## 2015-07-23 LAB — BASIC METABOLIC PANEL
Anion gap: 12 (ref 5–15)
BUN: 39 mg/dL — ABNORMAL HIGH (ref 6–20)
CO2: 26 mmol/L (ref 22–32)
Calcium: 7.9 mg/dL — ABNORMAL LOW (ref 8.9–10.3)
Chloride: 102 mmol/L (ref 101–111)
Creatinine, Ser: 7.71 mg/dL — ABNORMAL HIGH (ref 0.44–1.00)
GFR calc Af Amer: 6 mL/min — ABNORMAL LOW (ref >60)
GFR calc non Af Amer: 5 mL/min — ABNORMAL LOW (ref >60)
Glucose, Bld: 126 mg/dL — ABNORMAL HIGH (ref 65–99)
Potassium: 3.5 mmol/L (ref 3.5–5.1)
Sodium: 140 mmol/L (ref 135–145)

## 2015-07-23 LAB — CBC
HCT: 40.1 % (ref 35.0–47.0)
Hemoglobin: 13 g/dL (ref 12.0–16.0)
MCH: 28.7 pg (ref 26.0–34.0)
MCHC: 32.4 g/dL (ref 32.0–36.0)
MCV: 88.6 fL (ref 80.0–100.0)
Platelets: 194 10*3/uL (ref 150–440)
RBC: 4.53 MIL/uL (ref 3.80–5.20)
RDW: 21 % — ABNORMAL HIGH (ref 11.5–14.5)
WBC: 6.2 10*3/uL (ref 3.6–11.0)

## 2015-07-23 LAB — TROPONIN I: Troponin I: 0.05 ng/mL — ABNORMAL HIGH (ref <0.031)

## 2015-07-23 MED ORDER — PROCHLORPERAZINE MALEATE 5 MG PO TABS
5.0000 mg | ORAL_TABLET | Freq: Four times a day (QID) | ORAL | Status: DC | PRN
  Filled 2015-07-23: qty 1

## 2015-07-23 MED ORDER — RANOLAZINE ER 500 MG PO TB12
1000.0000 mg | ORAL_TABLET | Freq: Two times a day (BID) | ORAL | Status: DC
  Administered 2015-07-23 – 2015-07-25 (×5): 1000 mg via ORAL
  Filled 2015-07-23 (×6): qty 2

## 2015-07-23 MED ORDER — PANCRELIPASE (LIP-PROT-AMYL) 12000-38000 UNITS PO CPEP
36000.0000 [IU] | ORAL_CAPSULE | Freq: Three times a day (TID) | ORAL | Status: DC
  Administered 2015-07-23 – 2015-07-26 (×6): 36000 [IU] via ORAL
  Filled 2015-07-23 (×6): qty 3

## 2015-07-23 MED ORDER — GLUCOSE 4 G PO CHEW: 1.0000 | CHEWABLE_TABLET | Freq: Once | ORAL | Status: DC

## 2015-07-23 MED ORDER — HEPARIN SODIUM (PORCINE) 5000 UNIT/ML IJ SOLN
5000.0000 [IU] | Freq: Three times a day (TID) | INTRAMUSCULAR | Status: DC
  Administered 2015-07-23 – 2015-07-26 (×8): 5000 [IU] via SUBCUTANEOUS
  Filled 2015-07-23 (×8): qty 1

## 2015-07-23 MED ORDER — CARVEDILOL 12.5 MG PO TABS
25.0000 mg | ORAL_TABLET | Freq: Two times a day (BID) | ORAL | Status: DC
  Administered 2015-07-23 – 2015-07-26 (×6): 25 mg via ORAL
  Filled 2015-07-23 (×6): qty 2

## 2015-07-23 MED ORDER — PROMETHAZINE HCL 25 MG RE SUPP
25.0000 mg | Freq: Three times a day (TID) | RECTAL | Status: DC | PRN
  Filled 2015-07-23: qty 1

## 2015-07-23 MED ORDER — DOCUSATE SODIUM 100 MG PO CAPS
100.0000 mg | ORAL_CAPSULE | Freq: Two times a day (BID) | ORAL | Status: DC
  Filled 2015-07-23 (×2): qty 1

## 2015-07-23 MED ORDER — NITROGLYCERIN 0.4 MG SL SUBL: 0.4000 mg | SUBLINGUAL_TABLET | SUBLINGUAL | Status: DC | PRN

## 2015-07-23 MED ORDER — LORATADINE 10 MG PO TABS
10.0000 mg | ORAL_TABLET | Freq: Every day | ORAL | Status: DC
  Administered 2015-07-23 – 2015-07-24 (×2): 10 mg via ORAL
  Filled 2015-07-23 (×2): qty 1

## 2015-07-23 MED ORDER — ALBUTEROL SULFATE (2.5 MG/3ML) 0.083% IN NEBU: 2.5000 mg | INHALATION_SOLUTION | RESPIRATORY_TRACT | Status: DC | PRN

## 2015-07-23 MED ORDER — ASPIRIN EC 81 MG PO TBEC
81.0000 mg | DELAYED_RELEASE_TABLET | Freq: Every day | ORAL | Status: DC
  Administered 2015-07-23 – 2015-07-26 (×4): 81 mg via ORAL
  Filled 2015-07-23 (×4): qty 1

## 2015-07-23 MED ORDER — METOCLOPRAMIDE HCL 10 MG PO TABS
5.0000 mg | ORAL_TABLET | Freq: Three times a day (TID) | ORAL | Status: DC
  Administered 2015-07-23 – 2015-07-26 (×9): 5 mg via ORAL
  Filled 2015-07-23 (×9): qty 1

## 2015-07-23 MED ORDER — CLOPIDOGREL BISULFATE 75 MG PO TABS
75.0000 mg | ORAL_TABLET | Freq: Every day | ORAL | Status: DC
  Administered 2015-07-23 – 2015-07-26 (×4): 75 mg via ORAL
  Filled 2015-07-23 (×4): qty 1

## 2015-07-23 MED ORDER — VITAMIN B-12 1000 MCG PO TABS
1000.0000 ug | ORAL_TABLET | Freq: Every day | ORAL | Status: DC
  Administered 2015-07-23 – 2015-07-25 (×3): 1000 ug via ORAL
  Filled 2015-07-23 (×3): qty 1

## 2015-07-23 MED ORDER — CITALOPRAM HYDROBROMIDE 20 MG PO TABS
20.0000 mg | ORAL_TABLET | Freq: Every day | ORAL | Status: DC
  Administered 2015-07-23 – 2015-07-26 (×4): 20 mg via ORAL
  Filled 2015-07-23 (×4): qty 1

## 2015-07-23 MED ORDER — PROMETHAZINE HCL 25 MG PO TABS
12.5000 mg | ORAL_TABLET | Freq: Four times a day (QID) | ORAL | Status: DC | PRN
  Administered 2015-07-23 – 2015-07-25 (×2): 12.5 mg via ORAL
  Filled 2015-07-23 (×2): qty 1

## 2015-07-23 MED ORDER — DEXTROSE 50 % IV SOLN
1.0000 | Freq: Once | INTRAVENOUS | Status: AC
  Administered 2015-07-23: 50 mL via INTRAVENOUS

## 2015-07-23 MED ORDER — SUCRALFATE 1 G PO TABS
1.0000 g | ORAL_TABLET | Freq: Three times a day (TID) | ORAL | Status: DC
  Administered 2015-07-23 – 2015-07-26 (×9): 1 g via ORAL
  Filled 2015-07-23 (×10): qty 1

## 2015-07-23 MED ORDER — AMLODIPINE BESYLATE 10 MG PO TABS
10.0000 mg | ORAL_TABLET | Freq: Every day | ORAL | Status: DC
  Administered 2015-07-23 – 2015-07-26 (×4): 10 mg via ORAL
  Filled 2015-07-23 (×4): qty 1

## 2015-07-23 MED ORDER — CHOLECALCIFEROL 10 MCG (400 UNIT) PO TABS
400.0000 [IU] | ORAL_TABLET | Freq: Every day | ORAL | Status: DC
  Administered 2015-07-23 – 2015-07-25 (×3): 400 [IU] via ORAL
  Filled 2015-07-23 (×3): qty 1

## 2015-07-23 MED ORDER — NEPHRO-VITE 0.8 MG PO TABS
ORAL_TABLET | Freq: Every day | ORAL | Status: DC
  Administered 2015-07-23 – 2015-07-25 (×3): 1 via ORAL
  Filled 2015-07-23 (×7): qty 1

## 2015-07-23 MED ORDER — DEXTROSE 50 % IV SOLN
INTRAVENOUS | Status: AC
  Filled 2015-07-23: qty 50

## 2015-07-23 MED ORDER — SEVELAMER CARBONATE 800 MG PO TABS
800.0000 mg | ORAL_TABLET | Freq: Three times a day (TID) | ORAL | Status: DC
Start: 2015-07-23 — End: 2015-07-26
  Administered 2015-07-23 – 2015-07-26 (×6): 800 mg via ORAL
  Filled 2015-07-23 (×9): qty 1

## 2015-07-23 MED ORDER — GLUCOSE 4 G PO CHEW
4.0000 g | CHEWABLE_TABLET | ORAL | Status: DC
  Filled 2015-07-23: qty 1

## 2015-07-23 MED ORDER — PANTOPRAZOLE SODIUM 40 MG PO TBEC
40.0000 mg | DELAYED_RELEASE_TABLET | Freq: Every day | ORAL | Status: DC
  Administered 2015-07-23 – 2015-07-25 (×3): 40 mg via ORAL
  Filled 2015-07-23 (×3): qty 1

## 2015-07-23 MED ORDER — ATORVASTATIN CALCIUM 20 MG PO TABS
20.0000 mg | ORAL_TABLET | Freq: Every evening | ORAL | Status: DC
  Administered 2015-07-23 – 2015-07-26 (×3): 20 mg via ORAL
  Filled 2015-07-23 (×3): qty 1

## 2015-07-23 MED ORDER — HYDROCODONE-ACETAMINOPHEN 5-325 MG PO TABS
1.0000 | ORAL_TABLET | Freq: Three times a day (TID) | ORAL | Status: DC | PRN
  Administered 2015-07-23 – 2015-07-26 (×5): 1 via ORAL
  Filled 2015-07-23 (×5): qty 1

## 2015-07-23 NOTE — Progress Notes (Signed)
Notified Dr. Manuella Ghazi of elevated troponin, no new orders received.

## 2015-07-23 NOTE — Progress Notes (Signed)
Inpatient Diabetes Program Recommendations  AACE/ADA: New Consensus Statement on Inpatient Glycemic Control (2015)  Target Ranges:  Prepandial:   less than 140 mg/dL      Peak postprandial:   less than 180 mg/dL (1-2 hours)      Critically ill patients:  140 - 180 mg/dL   Review of Glycemic Control:  Results for [name], [name] (MRN [id number]) as of 07/23/2015 11:27  Ref. Range 07/23/2015 07:28 07/23/2015 07:31 07/23/2015 07:51  Glucose-Capillary Latest Ref Range: 65-99 mg/dL 28 (LL) 26 (LL) 79  Results for [name], [name] (MRN [id number]) as of 07/23/2015 11:27  Ref. Range 02/21/2015 07:07 06/05/2015 16:00  Hemoglobin A1C Latest Ref Range: 4.0-6.0 % 4.9 3.2 (L)   Diabetes history: Type 2  Outpatient Diabetes medications: None Current orders for Inpatient glycemic control: Novolog sensitive tid with meals   Inpatient Diabetes Program Recommendations:    Note that patient's blood sugar was very low this morning.  She is not on any diabetes medications at home.  A1C indicates that average blood sugars over the past 2-3 months is 45.1 mg/dL??  Please hold Novolog insulin.  Also consider CBG's q 4 hours to assess for low blood glucose??  Thanks, Adah Perl, RN, BC-ADM Inpatient Diabetes Coordinator Pager [phone] (8a-5p)

## 2015-07-23 NOTE — Progress Notes (Signed)
Spoke to Dr. Candiss Norse okay to place a small bore IJ instead of PICC line since patient is a dialysis patient

## 2015-07-23 NOTE — Progress Notes (Signed)
[facility] [facility] [place] NAME: Skie Hugh    MR#:  [id number]  DATE OF BIRTH:  04-22-1961  SUBJECTIVE:  Patient is doing better this am no diarrhea this am  REVIEW OF SYSTEMS:    Review of Systems  Constitutional: Negative for fever, chills and malaise/fatigue.  HENT: Negative for sore throat.   Eyes: Negative for blurred vision.  Respiratory: Negative for cough, hemoptysis, shortness of breath and wheezing.   Cardiovascular: Negative for chest pain, palpitations and leg swelling.  Gastrointestinal: Negative for nausea, vomiting, abdominal pain, diarrhea and blood in stool.  Genitourinary: Negative for dysuria.  Musculoskeletal: Negative for back pain.  Neurological: Negative for dizziness, tremors and headaches.  Endo/Heme/Allergies: Does not bruise/bleed easily.    Tolerating Diet:yes      DRUG ALLERGIES:   Allergies  Allergen Reactions  . Compazine [Prochlorperazine Edisylate] Anaphylaxis and Nausea And Vomiting  . Ace Inhibitors Swelling  . Ativan [Lorazepam] Other (See Comments)    Reaction:  Hallucinations and headaches  . Codeine Nausea And Vomiting  . Gabapentin Other (See Comments)    Reaction:  Unknown   . Losartan Other (See Comments)    Reaction:  Unknown   . Scopolamine Other (See Comments)    Reaction:  Unknown   . Zofran [Ondansetron Hcl] Other (See Comments)    Reaction:  hallucinations   . Oxycodone Anxiety  . Tape Rash    VITALS:  Blood pressure 167/66, pulse 81, temperature 97.8 F (36.6 C), temperature source Oral, resp. rate 18, height 5' 5.5" (1.664 m), weight 75.9 kg (167 lb 5.3 oz), SpO2 100 %.  PHYSICAL EXAMINATION:   Physical Exam    LABORATORY PANEL:   CBC  Recent Labs Lab 07/23/15 0731  WBC 6.2  HGB 13.0  HCT 40.1  PLT 194   ------------------------------------------------------------------------------------------------------------------  Chemistries   Recent Labs Lab  07/22/15 1145 07/23/15 0731  NA 143 140  K 3.2* 3.5  CL 103 102  CO2 26 26  GLUCOSE 68 126*  BUN 38* 39*  CREATININE 7.32* 7.71*  CALCIUM 9.0 7.9*  AST 27  --   ALT 17  --   ALKPHOS 114  --   BILITOT 0.6  --    ------------------------------------------------------------------------------------------------------------------  Cardiac Enzymes  Recent Labs Lab 07/22/15 1145  TROPONINI <0.03   ------------------------------------------------------------------------------------------------------------------  RADIOLOGY:  Ct Abdomen Pelvis Wo Contrast  07/22/2015  CLINICAL DATA:  RIGHT upper quadrant pain. Rebound tenderness. Pain for 24 hours. EXAM: CT ABDOMEN AND PELVIS WITHOUT CONTRAST TECHNIQUE: Multidetector CT imaging of the abdomen and pelvis was performed following the standard protocol without IV contrast. COMPARISON:  CT 05/07/2015 acute FINDINGS: Lower chest: Moderate RIGHT pleural effusion is similar to comparison exam. Small LEFT effusion is similar. Bibasilar atelectasis not changed. Passive atelectasis the lung bases. Hepatobiliary: No focal hepatic lesions noncontrast exam. Post cholecystectomy. Pancreas: Pancreas is normal. No ductal dilatation. No pancreatic inflammation. Spleen: Normal spleen Adrenals/urinary tract: Adrenal glands and kidneys are normal. The ureters and bladder normal. Stomach/Bowel: The stomach is distended with oral contrast. There is thickening through the pylorus. The duodenum is normal. The proximal small bowel is normal caliber with normal mucosal pattern. The distal small bowel is not opacified with the oral contrast but there is no caliber change suggesting delay in oral transit without obstruction. Terminal ileum is normal. Appendix not identified. The ascending, transverse, and descending colon are collapsed. There is stool in the rectosigmoid colon. Vascular/Lymphatic: Abdominal aorta is normal  caliber with atherosclerotic calcification. There is  no retroperitoneal or periportal lymphadenopathy. No pelvic lymphadenopathy. Reproductive: Post hysterectomy. Musculoskeletal: No aggressive osseous lesion. Other: Mild anasarca of the soft tissues. No significant free fluid the abdomen pelvis. No intraperitoneal free air. IMPRESSION: 1. No evidence of bowel obstruction.  No intraperitoneal free air. 2. Degenerate of oral contrast within the stomach and proximal small bowel. This is likely a timing issue. Cannot exclude gastric paresis outlet obstruction although no obstructing lesion identified. Thickening through the pylorus is felt to be physiologic. 3. Bilateral pleural effusions which are moderate to large on the RIGHT are not changed from prior. 4. Anasarca of the soft tissues. Electronically Signed   By: Suzy Bouchard M.D.   On: 07/22/2015 15:28     ASSESSMENT AND PLAN:   54 year old female with a history of diabetes, CAD and end-stage renal disease on hemodialysis who presented with abdominal pain and diarrhea and found to have C. difficile colitis  1. C. difficile colitis: Patient is on IV Flagyl due to her nausea. I will continue IV therapy for 24 more hours and then switch to oral. Patient will also benefit from probiotic.  2. End stage renal disease and hematemesis: Patient did miss dialysis on Friday due to her nausea and diarrhea. Patient will resume her outpatient dialysis schedule here in the hospital.  3. Anemia of chronic kidney disease: Hemoglobin is stable.  4. Diabetes: Continue sliding scale insulin  5. History of CAD: Continue aspirin, Plavix atorvastatin and Coreg.  6. Depression: Continue Celexa 7. Essential hypertension: Continue Norvasc and Coreg.    Management plans discussed with the patient and  she is in agreement.  CODE STATUS: full  TOTAL TIME TAKING CARE OF THIS PATIENT:  30 minutes.     POSSIBLE D/C  1-2 days, DEPENDING ON CLINICAL CONDITION.   Joycelin Radloff M.D on 07/23/2015 at 12:09  PM  Between 7am to 6pm - Pager - [phone] After 6pm go to [email or web address] - password [email or web address] [facility]  Office  [phone]  CC: Primary care physician; Ellamae Sia, MD  Note: This dictation was prepared with Dragon dictation along with smaller phrase technology. Any transcriptional errors that result from this process are unintentional.

## 2015-07-23 NOTE — Progress Notes (Signed)
[facility]  ROUNDING NOTE   Subjective:   [name]  Tested positive for C Diff Patient seen during dialysis Tolerating well   Objective:  Vital signs in last 24 hours:  Temp:  [97.7 F (36.5 C)-98.7 F (37.1 C)] 97.8 F (36.6 C) (10/31 1015) Pulse Rate:  [32-84] 81 (10/31 1145) Resp:  [14-24] 18 (10/31 1145) BP: (161-181)/(63-84) 167/66 mmHg (10/31 1145) SpO2:  [80 %-100 %] 100 % (10/31 1015) Weight:  [75.9 kg (167 lb 5.3 oz)] 75.9 kg (167 lb 5.3 oz) (10/31 1000)  Weight change:  Filed Weights   07/22/15 1102 07/23/15 1000  Weight: 84.596 kg (186 lb 8 oz) 75.9 kg (167 lb 5.3 oz)    Intake/Output: I/O last 3 completed shifts: In: 49 [IV Piggyback:50] Out: 0    Intake/Output this shift:     Physical Exam: General: NAD, cachectic  Head: Normocephalic, atraumatic. Moist oral mucosal membranes  Eyes: Anicteric,   Neck: Supple, trachea midline  Lungs:  Clear to auscultation, normal effort, [place] oxygen  Heart: Regular rate and rhythm  Abdomen:  Soft, left lower quadrant abdominal pain.   Extremities: noeripheral edema.  Neurologic: Nonfocal, moving all four extremities  Skin: No lesions  Access: Left arm AVG    Basic Metabolic Panel:  Recent Labs Lab 07/20/15 1301 07/22/15 1145 07/23/15 0731  NA 138 143 140  K 4.2 3.2* 3.5  CL 102 103 102  CO2 23 26 26   GLUCOSE 91 68 126*  BUN 30* 38* 39*  CREATININE 5.44* 7.32* 7.71*  CALCIUM 9.4 9.0 7.9*    Liver Function Tests:  Recent Labs Lab 07/20/15 1301 07/22/15 1145  AST 37 27  ALT 19 17  ALKPHOS 115 114  BILITOT 0.7 0.6  PROT 7.2 7.7  ALBUMIN 2.6* 2.8*    Recent Labs Lab 07/20/15 1301 07/22/15 1145  LIPASE 26 29   No results for input(s): AMMONIA in the last 168 hours.  CBC:  Recent Labs Lab  07/20/15 1301 07/22/15 1145 07/23/15 0731  WBC 6.5 5.5 6.2  NEUTROABS 4.2  --   --   HGB 13.7 13.5 13.0  HCT 43.5 42.9 40.1  MCV 89.1 88.4 88.6  PLT 228 231 194    Cardiac Enzymes:  Recent Labs Lab 07/22/15 1145  TROPONINI <0.03    BNP: Invalid input(s): POCBNP  CBG:  Recent Labs Lab 07/23/15 0728 07/23/15 0731 07/23/15 0751 07/23/15 1147  GLUCAP 28* 26* 79 10    Microbiology: Results for orders placed or performed during the hospital encounter of 07/22/15  C difficile quick scan w PCR reflex     Status: Abnormal   Collection Time: 07/22/15 12:40 PM  Result Value Ref Range Status   C Diff antigen POSITIVE (A) NEGATIVE Final    Comment: REPORTED TO SHANNON PATCH AT 1539PM BY TB   C Diff toxin NEGATIVE NEGATIVE Final   C Diff interpretation   Final    Positive for toxigenic C. difficile, active toxin production not detected. Patient has toxigenic C. difficile organisms present in the bowel, but toxin was not detected. The patient may be a carrier or the level of toxin in the sample was below the limit  of detection. This information should be used in conjunction with the patient's clinical history when deciding on possible therapy.   Clostridium Difficile by PCR  Status: Abnormal   Collection Time: 07/22/15 12:40 PM  Result Value Ref Range Status   Toxigenic C Difficile by pcr POSITIVE (A) NEGATIVE Final    Comment: CRITICAL RESULT CALLED TO, READ BACK BY AND VERIFIED WITH: SHANNON PATCH ON 07/22/15 AT 1539 BY TB     Coagulation Studies: No results for input(s): LABPROT, INR in the last 72 hours.  Urinalysis:  Recent Labs  07/20/15 1633  COLORURINE YELLOW*  LABSPEC 1.009  PHURINE 8.0  GLUCOSEU NEGATIVE  HGBUR NEGATIVE  BILIRUBINUR NEGATIVE  KETONESUR NEGATIVE  PROTEINUR NEGATIVE  NITRITE NEGATIVE  LEUKOCYTESUR NEGATIVE      Imaging: Ct Abdomen Pelvis Wo Contrast  07/22/2015  CLINICAL DATA:  RIGHT upper quadrant pain. Rebound tenderness.  Pain for 24 hours. EXAM: CT ABDOMEN AND PELVIS WITHOUT CONTRAST TECHNIQUE: Multidetector CT imaging of the abdomen and pelvis was performed following the standard protocol without IV contrast. COMPARISON:  CT 05/07/2015 acute FINDINGS: Lower chest: Moderate RIGHT pleural effusion is similar to comparison exam. Small LEFT effusion is similar. Bibasilar atelectasis not changed. Passive atelectasis the lung bases. Hepatobiliary: No focal hepatic lesions noncontrast exam. Post cholecystectomy. Pancreas: Pancreas is normal. No ductal dilatation. No pancreatic inflammation. Spleen: Normal spleen Adrenals/urinary tract: Adrenal glands and kidneys are normal. The ureters and bladder normal. Stomach/Bowel: The stomach is distended with oral contrast. There is thickening through the pylorus. The duodenum is normal. The proximal small bowel is normal caliber with normal mucosal pattern. The distal small bowel is not opacified with the oral contrast but there is no caliber change suggesting delay in oral transit without obstruction. Terminal ileum is normal. Appendix not identified. The ascending, transverse, and descending colon are collapsed. There is stool in the rectosigmoid colon. Vascular/Lymphatic: Abdominal aorta is normal caliber with atherosclerotic calcification. There is no retroperitoneal or periportal lymphadenopathy. No pelvic lymphadenopathy. Reproductive: Post hysterectomy. Musculoskeletal: No aggressive osseous lesion. Other: Mild anasarca of the soft tissues. No significant free fluid the abdomen pelvis. No intraperitoneal free air. IMPRESSION: 1. No evidence of bowel obstruction.  No intraperitoneal free air. 2. Degenerate of oral contrast within the stomach and proximal small bowel. This is likely a timing issue. Cannot exclude gastric paresis outlet obstruction although no obstructing lesion identified. Thickening through the pylorus is felt to be physiologic. 3. Bilateral pleural effusions which are  moderate to large on the RIGHT are not changed from prior. 4. Anasarca of the soft tissues. Electronically Signed   By: Suzy Bouchard M.D.   On: 07/22/2015 15:28     Medications:     . heparin subcutaneous  5,000 Units Subcutaneous Q12H  . insulin aspart  0-9 Units Subcutaneous TID WC  . metronidazole  250 mg Intravenous Q8H     Assessment/ Plan:  [name] with long standing DM, HTN, hyperlipidemia, retinopathy, severe gastroparesis , GI bleed.   [place] [address] MWF  1. End Stage Renal Disease: missed dialysis on Friday. However with hypovolemia due to GI losses.  - Patient seen during dialysis Tolerating well  - no UF  2. C. Diff colitis A04.7:   metronidazole.  - continue supportive care.   3. Secondary Hyperparathyroidism: phos 5.6, PTH 93 as outpatient.  - will hold binders for now.   4. Anemia of chronic kidney disease: hemoglobin 13.. Most likely due to volume contraction.  - hold epo.    LOS: 1 [place] 10/31/201611:55 AM

## 2015-07-23 NOTE — Progress Notes (Signed)
Notified Dr. Benjie Karvonen of blood sugar 42 give orange juice and graham crackers. No IV access at this time. Will continue to monitor and notify Dr. If does not come up above 63.

## 2015-07-23 NOTE — Care Management Note (Signed)
Case Management Note  Patient Details  Name: BRIAWNA DABEL MRN: [id number] Date of Birth: 05/23/1961  Subjective/Objective:    Admitted from home with c-diff. Patient active with [facility] for home PT only.  Dialysis M-W-F. Notifed.  [name] with [facility] of dialysis patient.                 Action/Plan: Will monitor progression.   Expected Discharge Date:                  Expected Discharge Plan:  [place]  In-House Referral:     Discharge planning Services  CM Consult  Post Acute Care Choice:  Home Health Choice offered to:  Patient  DME Arranged:    DME Agency:     HH Arranged:    HH Agency:  [facility]  Status of Service:  In process, will continue to follow  Medicare Important Message Given:    Date Medicare IM Given:    Medicare IM give by:    Date Additional Medicare IM Given:    Additional Medicare Important Message give by:     If discussed at [place] of Stay Meetings, dates discussed:    Additional Comments:  Jolly Mango, RN 07/23/2015, 1:23 PM

## 2015-07-23 NOTE — Progress Notes (Signed)
Notified Dr. Benjie Karvonen of patient experiencing chest tightness. VSS. Per Dr. Benjie Karvonen place order for Troponins x 3.

## 2015-07-23 NOTE — Progress Notes (Signed)
FSBS of 28 via accucheck. Rn notified and re checked FSBS and was 26. Hypoglycemic protocol followed and MD paged. Per MD order given juice and 1 amp of D50. Pt asymptomatic but will continue to assess.

## 2015-07-23 NOTE — Progress Notes (Signed)
Notified Dr. Benjie Karvonen that patients blood sugar has not gone up and continues to decrease. Place order for PICC line on patient due to not able to get IV access. Also okay to place order for glucose tabs per pharmacy.

## 2015-07-24 LAB — BASIC METABOLIC PANEL
Anion gap: 9 (ref 5–15)
BUN: 17 mg/dL (ref 6–20)
CO2: 31 mmol/L (ref 22–32)
Calcium: 7.7 mg/dL — ABNORMAL LOW (ref 8.9–10.3)
Chloride: 97 mmol/L — ABNORMAL LOW (ref 101–111)
Creatinine, Ser: 4.72 mg/dL — ABNORMAL HIGH (ref 0.44–1.00)
GFR calc Af Amer: 11 mL/min — ABNORMAL LOW (ref >60)
GFR calc non Af Amer: 9 mL/min — ABNORMAL LOW (ref >60)
Glucose, Bld: 95 mg/dL (ref 65–99)
Potassium: 3.8 mmol/L (ref 3.5–5.1)
Sodium: 137 mmol/L (ref 135–145)

## 2015-07-24 LAB — GLUCOSE, CAPILLARY
Glucose-Capillary: 61 mg/dL — ABNORMAL LOW (ref 65–99)
Glucose-Capillary: 65 mg/dL (ref 65–99)
Glucose-Capillary: 80 mg/dL (ref 65–99)
Glucose-Capillary: 87 mg/dL (ref 65–99)
Glucose-Capillary: 111 mg/dL — ABNORMAL HIGH (ref 65–99)
Glucose-Capillary: 179 mg/dL — ABNORMAL HIGH (ref 65–99)
Glucose-Capillary: 142 mg/dL — ABNORMAL HIGH (ref 65–99)

## 2015-07-24 LAB — HEPATITIS B SURFACE ANTIGEN: Hepatitis B Surface Ag: NEGATIVE

## 2015-07-24 LAB — MAGNESIUM: Magnesium: 2 mg/dL (ref 1.7–2.4)

## 2015-07-24 LAB — POTASSIUM: Potassium: 4 mmol/L (ref 3.5–5.1)

## 2015-07-24 LAB — TROPONIN I
Troponin I: <0.03 ng/mL (ref <0.031)
Troponin I: <0.03 ng/mL (ref <0.031)

## 2015-07-24 MED ORDER — RISAQUAD PO CAPS
1.0000 | ORAL_CAPSULE | Freq: Every day | ORAL | Status: DC
  Administered 2015-07-24 – 2015-07-25 (×2): 1 via ORAL
  Filled 2015-07-24 (×2): qty 1

## 2015-07-24 MED ORDER — LORATADINE 10 MG PO TABS
5.0000 mg | ORAL_TABLET | Freq: Every day | ORAL | Status: DC
  Administered 2015-07-25 – 2015-07-26 (×2): 5 mg via ORAL
  Filled 2015-07-24 (×2): qty 1

## 2015-07-24 MED ORDER — AMIODARONE LOAD VIA INFUSION
150.0000 mg | Freq: Once | INTRAVENOUS | Status: AC
  Administered 2015-07-24: 150 mg via INTRAVENOUS
  Filled 2015-07-24: qty 83.34

## 2015-07-24 MED ORDER — AMIODARONE HCL IN DEXTROSE 360-4.14 MG/200ML-% IV SOLN
30.0000 mg/h | INTRAVENOUS | Status: DC
  Administered 2015-07-25 – 2015-07-26 (×3): 30 mg/h via INTRAVENOUS
  Filled 2015-07-24 (×6): qty 200

## 2015-07-24 MED ORDER — METRONIDAZOLE 500 MG PO TABS
500.0000 mg | ORAL_TABLET | Freq: Three times a day (TID) | ORAL | Status: DC
  Administered 2015-07-24 – 2015-07-26 (×6): 500 mg via ORAL
  Filled 2015-07-24 (×6): qty 1

## 2015-07-24 MED ORDER — AMIODARONE HCL IN DEXTROSE 360-4.14 MG/200ML-% IV SOLN
60.0000 mg/h | INTRAVENOUS | Status: AC
  Administered 2015-07-24: 60 mg/h via INTRAVENOUS
  Filled 2015-07-24 (×2): qty 200

## 2015-07-24 MED ORDER — BOOST / RESOURCE BREEZE PO LIQD
1.0000 | Freq: Two times a day (BID) | ORAL | Status: DC
  Administered 2015-07-24 – 2015-07-25 (×3): 1 via ORAL

## 2015-07-24 NOTE — Progress Notes (Signed)
[place] at [place] NAME: [name]    MR#:  [id number]  DATE OF BIRTH:  1960/09/28  SUBJECTIVE:  Patient is tolerating diet however had some loose stools and is concerned about going home today  REVIEW OF SYSTEMS:    Review of Systems  Constitutional: Negative for fever, chills and malaise/fatigue.  HENT: Negative for sore throat.   Eyes: Negative for blurred vision.  Respiratory: Negative for cough, hemoptysis, shortness of breath and wheezing.   Cardiovascular: Negative for chest pain, palpitations and leg swelling.  Gastrointestinal: Negative for nausea, vomiting, abdominal pain, diarrhea and blood in stool.  Genitourinary: Negative for dysuria.  Musculoskeletal: Negative for back pain.  Neurological: Negative for dizziness, tremors and headaches.  Endo/Heme/Allergies: Does not bruise/bleed easily.    Tolerating Diet:yes      DRUG ALLERGIES:   Allergies  Allergen Reactions  . Compazine [Prochlorperazine Edisylate] Anaphylaxis and Nausea And Vomiting  . Ace Inhibitors Swelling  . Ativan [Lorazepam] Other (See Comments)    Reaction:  Hallucinations and headaches  . Codeine Nausea And Vomiting  . Gabapentin Other (See Comments)    Reaction:  Unknown   . Losartan Other (See Comments)    Reaction:  Unknown   . Scopolamine Other (See Comments)    Reaction:  Unknown   . Zofran [Ondansetron Hcl] Other (See Comments)    Reaction:  hallucinations   . Oxycodone Anxiety  . Tape Rash    VITALS:  Blood pressure 147/63, pulse 68, temperature 98 F (36.7 C), temperature source Oral, resp. rate 18, height 5' 5.5" (1.664 m), weight 75.9 kg (167 lb 5.3 oz), SpO2 100 %.  PHYSICAL EXAMINATION:   Physical Exam    LABORATORY PANEL:   CBC  Recent Labs Lab 07/23/15 0731  WBC 6.2  HGB 13.0  HCT 40.1  PLT 194    ------------------------------------------------------------------------------------------------------------------  Chemistries   Recent Labs Lab 07/22/15 1145  07/24/15 0550  NA 143  < > 137  K 3.2*  < > 3.8  CL 103  < > 97*  CO2 26  < > 31  GLUCOSE 68  < > 95  BUN 38*  < > 17  CREATININE 7.32*  < > 4.72*  CALCIUM 9.0  < > 7.7*  AST 27  --   --   ALT 17  --   --   ALKPHOS 114  --   --   BILITOT 0.6  --   --   < > = values in this interval not displayed. ------------------------------------------------------------------------------------------------------------------  Cardiac Enzymes  Recent Labs Lab 07/23/15 1806 07/24/15 0234 07/24/15 0550  TROPONINI 0.05* <0.03 <0.03   ------------------------------------------------------------------------------------------------------------------  RADIOLOGY:  Ct Abdomen Pelvis Wo Contrast  07/22/2015  CLINICAL DATA:  RIGHT upper quadrant pain. Rebound tenderness. Pain for 24 hours. EXAM: CT ABDOMEN AND PELVIS WITHOUT CONTRAST TECHNIQUE: Multidetector CT imaging of the abdomen and pelvis was performed following the standard protocol without IV contrast. COMPARISON:  CT 05/07/2015 acute FINDINGS: Lower chest: Moderate RIGHT pleural effusion is similar to comparison exam. Small LEFT effusion is similar. Bibasilar atelectasis not changed. Passive atelectasis the lung bases. Hepatobiliary: No focal hepatic lesions noncontrast exam. Post cholecystectomy. Pancreas: Pancreas is normal. No ductal dilatation. No pancreatic inflammation. Spleen: Normal spleen Adrenals/urinary tract: Adrenal glands and kidneys are normal. The ureters and bladder normal. Stomach/Bowel: The stomach is distended with oral contrast. There is thickening through the pylorus. The duodenum is normal. The proximal small bowel is  normal caliber with normal mucosal pattern. The distal small bowel is not opacified with the oral contrast but there is no caliber change suggesting  delay in oral transit without obstruction. Terminal ileum is normal. Appendix not identified. The ascending, transverse, and descending colon are collapsed. There is stool in the rectosigmoid colon. Vascular/Lymphatic: Abdominal aorta is normal caliber with atherosclerotic calcification. There is no retroperitoneal or periportal lymphadenopathy. No pelvic lymphadenopathy. Reproductive: Post hysterectomy. Musculoskeletal: No aggressive osseous lesion. Other: Mild anasarca of the soft tissues. No significant free fluid the abdomen pelvis. No intraperitoneal free air. IMPRESSION: 1. No evidence of bowel obstruction.  No intraperitoneal free air. 2. Degenerate of oral contrast within the stomach and proximal small bowel. This is likely a timing issue. Cannot exclude gastric paresis outlet obstruction although no obstructing lesion identified. Thickening through the pylorus is felt to be physiologic. 3. Bilateral pleural effusions which are moderate to large on the RIGHT are not changed from prior. 4. Anasarca of the soft tissues. Electronically Signed   By: Suzy Bouchard M.D.   On: 07/22/2015 15:28   Dg Chest Port 1 View  07/23/2015  CLINICAL DATA:  PICC placement. EXAM: PORTABLE CHEST 1 VIEW COMPARISON:  07/20/2015. FINDINGS: Stable left subclavian and axillary vascular stent. Interval left subclavian catheter with its tip at the junction of the superior vena cava and right atrium. No peripheral intravenous catheter seen. Borderline enlarged cardiac silhouette without significant change. Increased bilateral pleural fluid and bibasilar patchy opacity. Unremarkable bones. IMPRESSION: 1. Left subclavian catheter tip at the superior cavoatrial junction. 2. Progressive changes of congestive heart failure. Electronically Signed   By: Claudie Revering M.D.   On: 07/23/2015 20:17     ASSESSMENT AND PLAN:   54 year old female with a history of diabetes, CAD and end-stage renal disease on hemodialysis who presented with  abdominal pain and diarrhea and found to have C. difficile colitis  1. C. difficile colitis: She will be switched to oral Flagyl. She is tolerating a diet which I'll continue. I will add probiotic 2. End stage renal disease and hematemesis: Patient did miss dialysis on Friday due to her nausea and diarrhea. Patient will resume her outpatient dialysis schedule here in the hospital.  3. Anemia of chronic kidney disease: Hemoglobin is stable.  4. Diabetes: Continue sliding scale insulin. Blood sugars have improved  5. History of CAD: Continue aspirin, Plavix atorvastatin and Coreg.  6. Depression: Continue Celexa 7. Essential hypertension: Continue Norvasc and Coreg.    Management plans discussed with the patient and  she is in agreement.  CODE STATUS: full  TOTAL TIME TAKING CARE OF THIS PATIENT:  25 minutes.     POSSIBLE D/C  1-2 days, DEPENDING ON CLINICAL CONDITION.   Shivangi Lutz M.D on 07/24/2015 at 12:16 PM  Between 7am to 6pm - Pager - [phone] After 6pm go to [email or web address] - password [email or web address] [facility]  Office  [phone]  CC: Primary care physician; Ellamae Sia, MD  Note: This dictation was prepared with Dragon dictation along with smaller phrase technology. Any transcriptional errors that result from this process are unintentional.

## 2015-07-24 NOTE — Progress Notes (Signed)
Notifed Dr. Benjie Karvonen of 20 beat run of V tach. Assessed patient asymptomatic. Vital signs stable. Please place order for cardiology consult. EKG Stat. As well as order for labs magnesium and potassium stat. Notify on call MD if labs abnormal.

## 2015-07-24 NOTE — Care Management Important Message (Signed)
Important Message  Patient Details  Name: JESSIECA ZEMAN MRN: [id number] Date of Birth: 06/24/1961   Medicare Important Message Given:  Yes-second notification given    [name] A Kollyns Mickelson 07/24/2015, 9:53 AM

## 2015-07-24 NOTE — Progress Notes (Signed)
Notified Dr. Humphrey Rolls of consult and he would like nurse to place order to transfer patient to 2A Telemetry. Also place order for amioderone drip per protocol.

## 2015-07-24 NOTE — Progress Notes (Signed)
Patient admitted to floor.  Telemetry verified. Vitals obtained. Patient is stable.

## 2015-07-24 NOTE — Care Management Note (Signed)
Patients current outpatient dialysis schedule is [name] MWF.  Clinic has been notified of admission and I will sent them updated records at discharge.  [name]  Dialysis Liaison  [phone]

## 2015-07-24 NOTE — Progress Notes (Signed)
MEDICATION RELATED CONSULT NOTE - INITIAL   Pharmacy Consult for Drug-Drug interactions with Amiodarone   Allergies  Allergen Reactions  . Compazine [Prochlorperazine Edisylate] Anaphylaxis and Nausea And Vomiting  . Ace Inhibitors Swelling  . Ativan [Lorazepam] Other (See Comments)    Reaction:  Hallucinations and headaches  . Codeine Nausea And Vomiting  . Gabapentin Other (See Comments)    Reaction:  Unknown   . Losartan Other (See Comments)    Reaction:  Unknown   . Scopolamine Other (See Comments)    Reaction:  Unknown   . Zofran [Ondansetron Hcl] Other (See Comments)    Reaction:  hallucinations   . Oxycodone Anxiety  . Tape Rash    Patient Measurements: Height: 5' 5.5" (166.4 cm) Weight: 167 lb 5.3 oz (75.9 kg) IBW/kg (Calculated) : 58.15 Adjusted Body Weight:   Vital Signs: Temp: 98.4 F (36.9 C) (11/01 1900) Temp Source: Oral (11/01 1900) BP: 145/57 mmHg (11/01 1900) Pulse Rate: 71 (11/01 1900) Intake/Output from previous day: 10/31 0701 - 11/01 0700 In: 51 [IV Piggyback:51] Out: 0  Intake/Output from this shift:    Labs:  Recent Labs  07/22/15 1145 07/23/15 0731 07/24/15 0550 07/24/15 1818  WBC 5.5 6.2  --   --   HGB 13.5 13.0  --   --   HCT 42.9 40.1  --   --   PLT 231 194  --   --   CREATININE 7.32* 7.71* 4.72*  --   MG  --   --   --  2.0  ALBUMIN 2.8*  --   --   --   PROT 7.7  --   --   --   AST 27  --   --   --   ALT 17  --   --   --   ALKPHOS 114  --   --   --   BILITOT 0.6  --   --   --    Estimated Creatinine Clearance: 13.7 mL/min (by C-G formula based on Cr of 4.72).   Microbiology: Recent Results (from the past 720 hour(s))  C difficile quick scan w PCR reflex     Status: Abnormal   Collection Time: 07/22/15 12:40 PM  Result Value Ref Range Status   C Diff antigen POSITIVE (A) NEGATIVE Final    Comment: REPORTED TO SHANNON PATCH AT 1539PM BY TB   C Diff toxin NEGATIVE NEGATIVE Final   C Diff interpretation   Final   Positive for toxigenic C. difficile, active toxin production not detected. Patient has toxigenic C. difficile organisms present in the bowel, but toxin was not detected. The patient may be a carrier or the level of toxin in the sample was below the limit  of detection. This information should be used in conjunction with the patient's clinical history when deciding on possible therapy.   Clostridium Difficile by PCR     Status: Abnormal   Collection Time: 07/22/15 12:40 PM  Result Value Ref Range Status   Toxigenic C Difficile by pcr POSITIVE (A) NEGATIVE Final    Comment: CRITICAL RESULT CALLED TO, READ BACK BY AND VERIFIED WITH: SHANNON PATCH ON 07/22/15 AT 1539 BY TB     Medical History: Past Medical History  Diagnosis Date  . Asthma   . Collagen vascular disease ([place])   . Diabetes mellitus without complication ([place])   . Hypertension   . Coronary artery disease     a. cath 2013: stenting to RCA (report  not available); b. cath 2014: LM nl, pLAD 40%, mLAD nl, ost LCx 40%, mid LCx nl, pRCA 30% @ site of prior stent, mRCA 50%  . Diabetic neuropathy ([place])   . ESRD (end stage renal disease) on dialysis ([place])     M-W-F  . GERD (gastroesophageal reflux disease)   . Hx of pancreatitis 2015  . Myocardial infarction ([place])   . Pneumonia   . Mitral regurgitation     a. echo 10/2013: EF 62%, noWMA, mildly dilated LA, mild to mod MR/TR, GR1DD  . COPD (chronic obstructive pulmonary disease) ([place])   . chronic diastolic CHF [id number]  . dialysis 2006    Medications:  Scheduled:  . acidophilus  1 capsule Oral Daily  . amiodarone  150 mg Intravenous Once  . amLODipine  10 mg Oral Daily  . aspirin EC  81 mg Oral Daily  . atorvastatin  20 mg Oral QPM  . b complex-vitamin c-folic acid   Oral Daily  . carvedilol  25 mg Oral BID  . cholecalciferol  400 Units Oral Daily  . citalopram  20 mg Oral Daily  . clopidogrel  75 mg Oral Daily  . docusate sodium  100 mg Oral BID  . feeding supplement  1  Container Oral BID BM  . glucose  4 g Oral See admin instructions  . heparin subcutaneous  5,000 Units Subcutaneous 3 times per day  . insulin aspart  0-9 Units Subcutaneous TID WC  . lipase/protease/amylase  36,000 Units Oral TID WC  . [START ON 07/25/2015] loratadine  5 mg Oral Daily  . metoCLOPramide  5 mg Oral TID AC & HS  . metroNIDAZOLE  500 mg Oral 3 times per day  . pantoprazole  40 mg Oral Daily  . ranolazine  1,000 mg Oral BID  . sevelamer carbonate  800 mg Oral TID WC  . sucralfate  1 g Oral TID WC & HS  . vitamin B-12  1,000 mcg Oral Daily    Assessment: 54 yo F started on Amiodarone drip.  Plan:  Assessment for Dru-drug interactions with Amiodarone. Loratadine can cause QT prolongation/Torsades de pointes. Recommend decrease dose to 5 mg daily. Patient on Cetirzine at home but it is non-formulary.  If Consider switch to Cetirizine, pharmacy would need to obtain medication.  Chinita Greenland PharmD Clinical Pharmacist 07/24/2015 8:05 PM

## 2015-07-24 NOTE — Progress Notes (Signed)
[facility]  ROUNDING NOTE   Subjective:   [name] Admitted for evaluation. Tested positive for C Diff States that she is a lot better since admission No complaint of shortness of breath Reports some soreness over the abdomen   Objective:  Vital signs in last 24 hours:  Temp:  [98 F (36.7 C)-98.8 F (37.1 C)] 98 F (36.7 C) (11/01 0434) Pulse Rate:  [66-83] 68 (11/01 1055) Resp:  [17-20] 18 (11/01 0434) BP: (139-183)/(55-71) 147/63 mmHg (11/01 1055) SpO2:  [96 %-100 %] 100 % (11/01 0434)  Weight change: -8.696 kg (-19 lb 2.7 oz) Filed Weights   07/22/15 1102 07/23/15 1000  Weight: 84.596 kg (186 lb 8 oz) 75.9 kg (167 lb 5.3 oz)    Intake/Output: I/O last 3 completed shifts: In: 101 [IV Piggyback:101] Out: 0    Intake/Output this shift:  Total I/O In: 50 [IV Piggyback:50] Out: 0   Physical Exam: General: NAD, cachectic  Head: Normocephalic, atraumatic. Moist oral mucosal membranes  Eyes: Anicteric,   Neck: Supple, trachea midline  Lungs:  Clear to auscultation, normal effort, [place] oxygen  Heart: Regular rate and rhythm  Abdomen:  Soft, left lower quadrant abdominal pain.   Extremities: No peripheral edema.  Neurologic: Nonfocal, moving all four extremities  Skin: No lesions  Access: Left arm AVG    Basic Metabolic Panel:  Recent Labs Lab 07/20/15 1301 07/22/15 1145 07/23/15 0731 07/24/15 0550  NA 138 143 140 137  K 4.2 3.2* 3.5 3.8  CL 102 103 102 97*  CO2 23 26 26 31   GLUCOSE 91 68 126* 95  BUN 30* 38* 39* 17  CREATININE 5.44* 7.32* 7.71* 4.72*  CALCIUM 9.4 9.0 7.9* 7.7*    Liver Function Tests:  Recent Labs Lab 07/20/15 1301 07/22/15 1145  AST 37 27  ALT 19 17  ALKPHOS 115 114  BILITOT 0.7 0.6  PROT 7.2 7.7  ALBUMIN 2.6* 2.8*    Recent  Labs Lab 07/20/15 1301 07/22/15 1145  LIPASE 26 29   No results for input(s): AMMONIA in the last 168 hours.  CBC:  Recent Labs Lab 07/20/15 1301 07/22/15 1145 07/23/15 0731  WBC 6.5 5.5 6.2  NEUTROABS 4.2  --   --   HGB 13.7 13.5 13.0  HCT 43.5 42.9 40.1  MCV 89.1 88.4 88.6  PLT 228 231 194    Cardiac Enzymes:  Recent Labs Lab 07/22/15 1145 07/23/15 1806 07/24/15 0234 07/24/15 0550  TROPONINI <0.03 0.05* <0.03 <0.03    BNP: Invalid input(s): POCBNP  CBG:  Recent Labs Lab 07/23/15 2300 07/24/15 0354 07/24/15 0423 07/24/15 0609 07/24/15 0734  GLUCAP 107* 61* 65 80 87    Microbiology: Results for orders placed or performed during the hospital encounter of 07/22/15  C difficile quick scan w PCR reflex     Status: Abnormal   Collection Time: 07/22/15 12:40 PM  Result Value Ref Range Status   C Diff antigen POSITIVE (A) NEGATIVE Final    Comment: REPORTED TO SHANNON PATCH AT 1539PM BY TB   C Diff toxin NEGATIVE NEGATIVE Final   C Diff interpretation   Final    Positive for toxigenic C. difficile, active toxin production not detected. Patient has toxigenic C. difficile organisms present in the bowel, but toxin was not detected. The patient may be a carrier or the  level of toxin in the sample was below the limit  of detection. This information should be used in conjunction with the patient's clinical history when deciding on possible therapy.   Clostridium Difficile by PCR     Status: Abnormal   Collection Time: 07/22/15 12:40 PM  Result Value Ref Range Status   Toxigenic C Difficile by pcr POSITIVE (A) NEGATIVE Final    Comment: CRITICAL RESULT CALLED TO, READ BACK BY AND VERIFIED WITH: SHANNON PATCH ON 07/22/15 AT 1539 BY TB     Coagulation Studies: No results for input(s): LABPROT, INR in the last 72 hours.  Urinalysis: No results for input(s): COLORURINE, LABSPEC, PHURINE, GLUCOSEU, HGBUR, BILIRUBINUR, KETONESUR, PROTEINUR, UROBILINOGEN, NITRITE,  LEUKOCYTESUR in the last 72 hours.  Invalid input(s): APPERANCEUR    Imaging: Ct Abdomen Pelvis Wo Contrast  07/22/2015  CLINICAL DATA:  RIGHT upper quadrant pain. Rebound tenderness. Pain for 24 hours. EXAM: CT ABDOMEN AND PELVIS WITHOUT CONTRAST TECHNIQUE: Multidetector CT imaging of the abdomen and pelvis was performed following the standard protocol without IV contrast. COMPARISON:  CT 05/07/2015 acute FINDINGS: Lower chest: Moderate RIGHT pleural effusion is similar to comparison exam. Small LEFT effusion is similar. Bibasilar atelectasis not changed. Passive atelectasis the lung bases. Hepatobiliary: No focal hepatic lesions noncontrast exam. Post cholecystectomy. Pancreas: Pancreas is normal. No ductal dilatation. No pancreatic inflammation. Spleen: Normal spleen Adrenals/urinary tract: Adrenal glands and kidneys are normal. The ureters and bladder normal. Stomach/Bowel: The stomach is distended with oral contrast. There is thickening through the pylorus. The duodenum is normal. The proximal small bowel is normal caliber with normal mucosal pattern. The distal small bowel is not opacified with the oral contrast but there is no caliber change suggesting delay in oral transit without obstruction. Terminal ileum is normal. Appendix not identified. The ascending, transverse, and descending colon are collapsed. There is stool in the rectosigmoid colon. Vascular/Lymphatic: Abdominal aorta is normal caliber with atherosclerotic calcification. There is no retroperitoneal or periportal lymphadenopathy. No pelvic lymphadenopathy. Reproductive: Post hysterectomy. Musculoskeletal: No aggressive osseous lesion. Other: Mild anasarca of the soft tissues. No significant free fluid the abdomen pelvis. No intraperitoneal free air. IMPRESSION: 1. No evidence of bowel obstruction.  No intraperitoneal free air. 2. Degenerate of oral contrast within the stomach and proximal small bowel. This is likely a timing issue.  Cannot exclude gastric paresis outlet obstruction although no obstructing lesion identified. Thickening through the pylorus is felt to be physiologic. 3. Bilateral pleural effusions which are moderate to large on the RIGHT are not changed from prior. 4. Anasarca of the soft tissues. Electronically Signed   By: Suzy Bouchard M.D.   On: 07/22/2015 15:28   Dg Chest Port 1 View  07/23/2015  CLINICAL DATA:  PICC placement. EXAM: PORTABLE CHEST 1 VIEW COMPARISON:  07/20/2015. FINDINGS: Stable left subclavian and axillary vascular stent. Interval left subclavian catheter with its tip at the junction of the superior vena cava and right atrium. No peripheral intravenous catheter seen. Borderline enlarged cardiac silhouette without significant change. Increased bilateral pleural fluid and bibasilar patchy opacity. Unremarkable bones. IMPRESSION: 1. Left subclavian catheter tip at the superior cavoatrial junction. 2. Progressive changes of congestive heart failure. Electronically Signed   By: Claudie Revering M.D.   On: 07/23/2015 20:17     Medications:     . amLODipine  10 mg Oral Daily  . aspirin EC  81 mg Oral Daily  . atorvastatin  20 mg Oral QPM  . b complex-vitamin c-folic acid  Oral Daily  . carvedilol  25 mg Oral BID  . cholecalciferol  400 Units Oral Daily  . citalopram  20 mg Oral Daily  . clopidogrel  75 mg Oral Daily  . docusate sodium  100 mg Oral BID  . glucose  4 g Oral See admin instructions  . heparin subcutaneous  5,000 Units Subcutaneous 3 times per day  . insulin aspart  0-9 Units Subcutaneous TID WC  . lipase/protease/amylase  36,000 Units Oral TID WC  . loratadine  10 mg Oral Daily  . metoCLOPramide  5 mg Oral TID AC & HS  . metronidazole  250 mg Intravenous Q8H  . pantoprazole  40 mg Oral Daily  . ranolazine  1,000 mg Oral BID  . sevelamer carbonate  800 mg Oral TID WC  . sucralfate  1 g Oral TID WC & HS  . vitamin B-12  1,000 mcg Oral Daily     Assessment/ Plan:  Ms.  [name] is a 54 y.o.black female with long standing DM, HTN, hyperlipidemia, retinopathy, severe gastroparesis , GI bleed.   [facility] [address] MWF  1. End Stage Renal Disease: missed dialysis on Friday. However with hypovolemia due to GI losses.  - Next dialysis treatment tomorrow   2. C. Diff colitis A04.7:   metronidazole.  - continue supportive care.   3. Secondary Hyperparathyroidism: phos 5.6, PTH 93 as outpatient.  - will hold binders for now.   4. Anemia of chronic kidney disease: hemoglobin 13.. Most likely due to volume contraction.  - hold epo.    LOS: 2 [name] 11/1/201611:06 AM

## 2015-07-25 ENCOUNTER — Inpatient Hospital Stay: Admission: RE | Admit: 2015-07-25 | Payer: Medicare Other | Source: Ambulatory Visit

## 2015-07-25 LAB — CBC
HCT: 38.2 % (ref 35.0–47.0)
Hemoglobin: 12.1 g/dL (ref 12.0–16.0)
MCH: 27.9 pg (ref 26.0–34.0)
MCHC: 31.6 g/dL — ABNORMAL LOW (ref 32.0–36.0)
MCV: 88.1 fL (ref 80.0–100.0)
Platelets: 135 10*3/uL — ABNORMAL LOW (ref 150–440)
RBC: 4.34 MIL/uL (ref 3.80–5.20)
RDW: 19.9 % — ABNORMAL HIGH (ref 11.5–14.5)
WBC: 6.2 10*3/uL (ref 3.6–11.0)

## 2015-07-25 LAB — BASIC METABOLIC PANEL
Anion gap: 7 (ref 5–15)
BUN: 24 mg/dL — ABNORMAL HIGH (ref 6–20)
CO2: 31 mmol/L (ref 22–32)
Calcium: 7.8 mg/dL — ABNORMAL LOW (ref 8.9–10.3)
Chloride: 96 mmol/L — ABNORMAL LOW (ref 101–111)
Creatinine, Ser: 5.55 mg/dL — ABNORMAL HIGH (ref 0.44–1.00)
GFR calc Af Amer: 9 mL/min — ABNORMAL LOW (ref >60)
GFR calc non Af Amer: 8 mL/min — ABNORMAL LOW (ref >60)
Glucose, Bld: 114 mg/dL — ABNORMAL HIGH (ref 65–99)
Potassium: 4.2 mmol/L (ref 3.5–5.1)
Sodium: 134 mmol/L — ABNORMAL LOW (ref 135–145)

## 2015-07-25 LAB — GLUCOSE, CAPILLARY
Glucose-Capillary: 112 mg/dL — ABNORMAL HIGH (ref 65–99)
Glucose-Capillary: 114 mg/dL — ABNORMAL HIGH (ref 65–99)
Glucose-Capillary: 118 mg/dL — ABNORMAL HIGH (ref 65–99)
Glucose-Capillary: 122 mg/dL — ABNORMAL HIGH (ref 65–99)
Glucose-Capillary: 153 mg/dL — ABNORMAL HIGH (ref 65–99)
Glucose-Capillary: 99 mg/dL (ref 65–99)

## 2015-07-25 LAB — PHOSPHORUS: Phosphorus: 2.7 mg/dL (ref 2.5–4.6)

## 2015-07-25 MED ORDER — RISAQUAD PO CAPS: 1.0000 | ORAL_CAPSULE | Freq: Every day | ORAL | Status: DC

## 2015-07-25 MED ORDER — PROMETHAZINE HCL 25 MG/ML IJ SOLN
25.0000 mg | Freq: Four times a day (QID) | INTRAMUSCULAR | Status: DC | PRN
  Administered 2015-07-25: 25 mg via INTRAVENOUS
  Filled 2015-07-25: qty 1

## 2015-07-25 MED ORDER — SODIUM CHLORIDE 0.9 % IJ SOLN
10.0000 mL | Freq: Two times a day (BID) | INTRAMUSCULAR | Status: DC
  Administered 2015-07-25 (×2): 20 mL

## 2015-07-25 MED ORDER — METRONIDAZOLE 500 MG PO TABS: 500.0000 mg | ORAL_TABLET | Freq: Three times a day (TID) | ORAL | Status: DC

## 2015-07-25 MED ORDER — SODIUM CHLORIDE 0.9 % IJ SOLN: 10.0000 mL | INTRAMUSCULAR | Status: DC | PRN

## 2015-07-25 MED ORDER — SODIUM CHLORIDE 0.9 % IJ SOLN
INTRAMUSCULAR | Status: AC
  Administered 2015-07-25: 20 mL
  Filled 2015-07-25: qty 20

## 2015-07-25 NOTE — Progress Notes (Signed)
HD tx completed.

## 2015-07-25 NOTE — Progress Notes (Signed)
Post hd tx 

## 2015-07-25 NOTE — Consult Note (Signed)
Primary Cardiologist: Dr. Neoma Laming   Reason for Consultation : Non-sustained V Tach   HPI : This is a 65yoF well known to our practice who presented to [facility] c/o diarrhea and abdominal pain. She was noted to have non-sustained VT yesterday, thus cardiology was consulted. She c/o chest heaviness, SOB and orthopnea. Of note, she has missed several appts with our office, but had CCTA completed 01/2015 which showed patent RCA stent, mild distal RCA and proximal LAD disease. She also had an echo 01/2015 which showed EF 47%, mild-mod MR.         Review of Systems: General: negative for chills, fever, night sweats or weight changes.  Cardiovascular: positive for chest pain, orthopnea, shortness of breath and dyspnea on exertion Dermatological: negative for rash Respiratory: negative for cough or wheezing Urologic: negative for hematuria Abdominal:positive for for nausea, diarrhea, negative for bright red blood per rectum, melena, or hematemesis Neurologic: negative for visual changes, syncope, or dizziness All other systems reviewed and are otherwise negative except as noted above.    Past Medical History  Diagnosis Date  . Asthma   . Collagen vascular disease ([place])   . Diabetes mellitus without complication ([place])   . Hypertension   . Coronary artery disease     a. cath 2013: stenting to RCA (report not available); b. cath 2014: LM nl, pLAD 40%, mLAD nl, ost LCx 40%, mid LCx nl, pRCA 30% @ site of prior stent, mRCA 50%  . Diabetic neuropathy ([place])   . ESRD (end stage renal disease) on dialysis ([place])     M-W-F  . GERD (gastroesophageal reflux disease)   . Hx of pancreatitis 2015  . Myocardial infarction ([place])   . Pneumonia   . Mitral regurgitation     a. echo 10/2013: EF 62%, noWMA, mildly dilated LA, mild to mod MR/TR, GR1DD  . COPD (chronic obstructive pulmonary disease) ([place])   . chronic diastolic CHF [id number]  . dialysis 2006    Medications Prior to Admission   Medication Sig Dispense Refill  . albuterol (PROVENTIL HFA;VENTOLIN HFA) 108 (90 BASE) MCG/ACT inhaler Inhale 4-6 puffs by mouth every 4 hours as needed for wheezing, cough, and/or shortness of breath (Patient taking differently: Inhale 4-6 puffs into the lungs every 4 (four) hours as needed for wheezing or shortness of breath (or cough). ) 1 Inhaler 1  . albuterol (PROVENTIL) (2.5 MG/3ML) 0.083% nebulizer solution Take 2.5 mg by nebulization every 4 (four) hours as needed for wheezing or shortness of breath.    Marland Kitchen amLODipine (NORVASC) 10 MG tablet Take 10 mg by mouth daily. *Take after dialysis on dialysis day*    . aspirin EC 81 MG tablet Take 81 mg by mouth daily.    Marland Kitchen atorvastatin (LIPITOR) 20 MG tablet Take 20 mg by mouth every evening.    . B Complex-C-Biotin-E-FA (RENATABS PO) Take 1 tablet by mouth daily.    . carvedilol (COREG) 25 MG tablet Take 25 mg by mouth 2 (two) times daily.    . cetirizine (ZYRTEC) 10 MG tablet Take 10 mg by mouth daily.    . citalopram (CELEXA) 20 MG tablet Take 20 mg by mouth daily.    . clopidogrel (PLAVIX) 75 MG tablet Take 75 mg by mouth daily.    Marland Kitchen Dextrose, Diabetic Use, (DEX4 GLUCOSE) 1 G CHEW Chew 3-4 g by mouth See admin instructions. Chew and swallow 3-4 tablets (3-4 grams) orally as needed if sugar check is less than 60.    Marland Kitchen  docusate sodium (COLACE) 100 MG capsule Take 1 capsule (100 mg total) by mouth 2 (two) times daily. 60 capsule 0  . HYDROcodone-acetaminophen (NORCO/VICODIN) 5-325 MG per tablet Take 1 tablet by mouth 3 (three) times daily as needed for severe pain (for severe chronic pain.).     Marland Kitchen hydrocortisone cream 1 % Apply 1 application topically 2 (two) times daily.    . lipase/protease/amylase (CREON) 12000 UNITS CPEP capsule Take 36,000 Units by mouth 3 (three) times daily with meals.     . metoCLOPramide (REGLAN) 5 MG tablet Take 1 tablet (5 mg total) by mouth 4 (four) times daily -  before meals and at bedtime. 40 tablet 0  .  nitroGLYCERIN (NITROSTAT) 0.4 MG SL tablet Place 0.4 mg under the tongue every 5 (five) minutes x 3 doses as needed for chest pain. *Max 3 doses per episode*    . nystatin (MYCOSTATIN) 100000 UNIT/ML suspension Take 5 mLs by mouth every 4 (four) hours as needed. For thrush    . pantoprazole (PROTONIX) 40 MG tablet Take 40 mg by mouth daily.    . promethazine (PHENERGAN) 12.5 MG tablet Take 12.5 mg by mouth every 6 (six) hours as needed for nausea or vomiting.    . promethazine (PHENERGAN) 25 MG suppository Place 1 suppository (25 mg total) rectally every 8 (eight) hours as needed for nausea or vomiting. 12 each 0  . ranitidine (ZANTAC) 150 MG tablet Take 150 mg by mouth daily.     . ranolazine (RANEXA) 1000 MG SR tablet Take 1,000 mg by mouth 2 (two) times daily.    . sevelamer carbonate (RENVELA) 800 MG tablet Take 800 mg by mouth 3 (three) times daily with meals.     . sucralfate (CARAFATE) 1 G tablet Take 1 tablet (1 g total) by mouth 4 (four) times daily -  with meals and at bedtime. 120 tablet 0  . vitamin B-12 (CYANOCOBALAMIN) 1000 MCG tablet Take 1,000 mcg by mouth daily.    . Vitamin D, Cholecalciferol, 400 UNITS TABS Take 400 Units by mouth daily.     Marland Kitchen guaiFENesin-dextromethorphan (ROBITUSSIN DM) 100-10 MG/5ML syrup Take 10 mLs by mouth every 6 (six) hours as needed for cough. 118 mL 0  . ondansetron (ZOFRAN ODT) 4 MG disintegrating tablet Take 1 tablet (4 mg total) by mouth every 8 (eight) hours as needed for nausea or vomiting. 20 tablet 0  . prochlorperazine (COMPAZINE) 5 MG tablet Take 1 tablet (5 mg total) by mouth every 6 (six) hours as needed for nausea or vomiting. 30 tablet 1     . acidophilus  1 capsule Oral Daily  . amLODipine  10 mg Oral Daily  . aspirin EC  81 mg Oral Daily  . atorvastatin  20 mg Oral QPM  . b complex-vitamin c-folic acid   Oral Daily  . carvedilol  25 mg Oral BID  . cholecalciferol  400 Units Oral Daily  . citalopram  20 mg Oral Daily  . clopidogrel   75 mg Oral Daily  . docusate sodium  100 mg Oral BID  . feeding supplement  1 Container Oral BID BM  . glucose  4 g Oral See admin instructions  . heparin subcutaneous  5,000 Units Subcutaneous 3 times per day  . insulin aspart  0-9 Units Subcutaneous TID WC  . lipase/protease/amylase  36,000 Units Oral TID WC  . loratadine  5 mg Oral Daily  . metoCLOPramide  5 mg Oral TID AC & HS  .  metroNIDAZOLE  500 mg Oral 3 times per day  . pantoprazole  40 mg Oral Daily  . ranolazine  1,000 mg Oral BID  . sevelamer carbonate  800 mg Oral TID WC  . sodium chloride  10-40 mL Intracatheter Q12H  . sucralfate  1 g Oral TID WC & HS  . vitamin B-12  1,000 mcg Oral Daily    Infusions: . amiodarone 30 mg/hr (07/25/15 1021)    Allergies  Allergen Reactions  . Compazine [Prochlorperazine Edisylate] Anaphylaxis and Nausea And Vomiting  . Ace Inhibitors Swelling  . Ativan [Lorazepam] Other (See Comments)    Reaction:  Hallucinations and headaches  . Codeine Nausea And Vomiting  . Gabapentin Other (See Comments)    Reaction:  Unknown   . Losartan Other (See Comments)    Reaction:  Unknown   . Scopolamine Other (See Comments)    Reaction:  Unknown   . Zofran [Ondansetron Hcl] Other (See Comments)    Reaction:  hallucinations   . Oxycodone Anxiety  . Tape Rash    Social History   Social History  . Marital Status: Divorced    Spouse Name: N/A  . Number of Children: N/A  . Years of Education: N/A   Occupational History  . Not on file.   Social History Main Topics  . Smoking status: Current Some Day Smoker -- 0.50 packs/day    Types: Cigarettes    Last Attempt to Quit: 02/13/2015  . Smokeless tobacco: Never Used  . Alcohol Use: No  . Drug Use: No  . Sexual Activity: Not on file   Other Topics Concern  . Not on file   Social History Narrative    Family History  Problem Relation Age of Onset  . Kidney disease Mother   . Diabetes Mother     PHYSICAL EXAM: Filed Vitals:    07/25/15 0936  BP: 140/58  Pulse: 63  Temp: 97.8 F (36.6 C)  Resp: 16     Intake/Output Summary (Last 24 hours) at 07/25/15 1101 Last data filed at 07/25/15 0900  Gross per 24 hour  Intake      0 ml  Output    100 ml  Net   -100 ml    General:  Well appearing. No respiratory difficulty HEENT: normal Neck: supple. no JVD. Carotids 2+ bilat; no bruits. No lymphadenopathy or thryomegaly appreciated. Cor: PMI nondisplaced. Regular rate & rhythm. No rubs, gallops or murmurs. Lungs: clear Abdomen: soft, nontender, nondistended. No hepatosplenomegaly. No bruits or masses. Good bowel sounds. Extremities: no cyanosis, clubbing, rash, edema Neuro: alert & oriented x 3, cranial nerves grossly intact. moves all 4 extremities w/o difficulty. Affect pleasant.  ECG: NSR 71 BPM PRWP no acute changes  Results for orders placed or performed during the hospital encounter of 07/22/15 (from the past 24 hour(s))  Glucose, capillary     Status: Abnormal   Collection Time: 07/24/15 11:46 AM  Result Value Ref Range   Glucose-Capillary 111 (H) 65 - 99 mg/dL  Glucose, capillary     Status: Abnormal   Collection Time: 07/24/15  5:05 PM  Result Value Ref Range   Glucose-Capillary 179 (H) 65 - 99 mg/dL  Magnesium     Status: None   Collection Time: 07/24/15  6:18 PM  Result Value Ref Range   Magnesium 2.0 1.7 - 2.4 mg/dL  Potassium     Status: None   Collection Time: 07/24/15  6:18 PM  Result Value Ref Range   Potassium  4.0 3.5 - 5.1 mmol/L  Glucose, capillary     Status: Abnormal   Collection Time: 07/24/15  8:39 PM  Result Value Ref Range   Glucose-Capillary 142 (H) 65 - 99 mg/dL   Comment 1 Notify RN   Glucose, capillary     Status: Abnormal   Collection Time: 07/25/15  1:51 AM  Result Value Ref Range   Glucose-Capillary 122 (H) 65 - 99 mg/dL   Comment 1 Notify RN   Basic metabolic panel     Status: Abnormal   Collection Time: 07/25/15  6:30 AM  Result Value Ref Range   Sodium 134 (L)  135 - 145 mmol/L   Potassium 4.2 3.5 - 5.1 mmol/L   Chloride 96 (L) 101 - 111 mmol/L   CO2 31 22 - 32 mmol/L   Glucose, Bld 114 (H) 65 - 99 mg/dL   BUN 24 (H) 6 - 20 mg/dL   Creatinine, Ser 5.55 (H) 0.44 - 1.00 mg/dL   Calcium 7.8 (L) 8.9 - 10.3 mg/dL   GFR calc non Af Amer 8 (L) >60 mL/min   GFR calc Af Amer 9 (L) >60 mL/min   Anion gap 7 5 - 15  Glucose, capillary     Status: Abnormal   Collection Time: 07/25/15  7:44 AM  Result Value Ref Range   Glucose-Capillary 112 (H) 65 - 99 mg/dL   Comment 1 Notify RN    Dg Chest Port 1 View  07/23/2015  CLINICAL DATA:  PICC placement. EXAM: PORTABLE CHEST 1 VIEW COMPARISON:  07/20/2015. FINDINGS: Stable left subclavian and axillary vascular stent. Interval left subclavian catheter with its tip at the junction of the superior vena cava and right atrium. No peripheral intravenous catheter seen. Borderline enlarged cardiac silhouette without significant change. Increased bilateral pleural fluid and bibasilar patchy opacity. Unremarkable bones. IMPRESSION: 1. Left subclavian catheter tip at the superior cavoatrial junction. 2. Progressive changes of congestive heart failure. Electronically Signed   By: Claudie Revering M.D.   On: 07/23/2015 20:17     ASSESSMENT: Non-sustained V Tach   PLAN/DISCUSSION: No further episodes since yesterday as amiodarone gtt was initiated. Advise checking echo to evaluate WM and LVEF. Consider cardiac cath as pt has hx of CAD and CP.    Patient and plan discussed with supervising provider, Dr. Neoma Laming, who agrees with above findings.   Kelby Fam [place], [place] 07/25/2015 11:01 AM

## 2015-07-25 NOTE — Progress Notes (Signed)
Pre-hd tx 

## 2015-07-25 NOTE — Progress Notes (Signed)
[facility] [place] NAME: [name]    MR#:  [id number]  DATE OF BIRTH:  1961/09/14  SUBJECTIVE:  Patient had nonsustained V. tach yesterday and was asymptomatic. She denies chest pain or pressure.  REVIEW OF SYSTEMS:    Review of Systems  Constitutional: Negative for fever, chills and malaise/fatigue.  HENT: Negative for sore throat.   Eyes: Negative for blurred vision.  Respiratory: Negative for cough, hemoptysis, shortness of breath and wheezing.   Cardiovascular: Negative for chest pain, palpitations and leg swelling.  Gastrointestinal: Negative for nausea, vomiting, abdominal pain, diarrhea and blood in stool.  Genitourinary: Negative for dysuria.  Musculoskeletal: Negative for back pain.  Neurological: Negative for dizziness, tremors and headaches.  Endo/Heme/Allergies: Does not bruise/bleed easily.    Tolerating Diet:yes      DRUG ALLERGIES:   Allergies  Allergen Reactions  . Compazine [Prochlorperazine Edisylate] Anaphylaxis and Nausea And Vomiting  . Ace Inhibitors Swelling  . Ativan [Lorazepam] Other (See Comments)    Reaction:  Hallucinations and headaches  . Codeine Nausea And Vomiting  . Gabapentin Other (See Comments)    Reaction:  Unknown   . Losartan Other (See Comments)    Reaction:  Unknown   . Scopolamine Other (See Comments)    Reaction:  Unknown   . Zofran [Ondansetron Hcl] Other (See Comments)    Reaction:  hallucinations   . Oxycodone Anxiety  . Tape Rash    VITALS:  Blood pressure 140/58, pulse 63, temperature 97.8 F (36.6 C), temperature source Oral, resp. rate 16, height 5' 5.5" (1.664 m), weight 75.9 kg (167 lb 5.3 oz), SpO2 99 %.  PHYSICAL EXAMINATION:   Physical Exam  Constitutional: She is oriented to person, place, and time and well-developed, well-nourished, and in no distress. No distress.  HENT:  Head: Normocephalic.  Eyes: No scleral icterus.  Neck: Normal range of  motion. Neck supple. No JVD present. No tracheal deviation present.  Cardiovascular: Normal rate, regular rhythm and normal heart sounds.  Exam reveals no gallop and no friction rub.   No murmur heard. Pulmonary/Chest: Effort normal and breath sounds normal. No respiratory distress. She has no wheezes. She has no rales. She exhibits no tenderness.  Abdominal: Soft. Bowel sounds are normal. She exhibits no distension and no mass. There is no tenderness. There is no rebound and no guarding.  Musculoskeletal: Normal range of motion. She exhibits no edema.  Neurological: She is alert and oriented to person, place, and time.  Skin: Skin is warm. No rash noted. No erythema.  Psychiatric: Affect and judgment normal.      LABORATORY PANEL:   CBC  Recent Labs Lab 07/23/15 0731  WBC 6.2  HGB 13.0  HCT 40.1  PLT 194   ------------------------------------------------------------------------------------------------------------------  Chemistries   Recent Labs Lab 07/22/15 1145  07/24/15 1818 07/25/15 0630  NA 143  < >  --  134*  K 3.2*  < > 4.0 4.2  CL 103  < >  --  96*  CO2 26  < >  --  31  GLUCOSE 68  < >  --  114*  BUN 38*  < >  --  24*  CREATININE 7.32*  < >  --  5.55*  CALCIUM 9.0  < >  --  7.8*  MG  --   --  2.0  --   AST 27  --   --   --   ALT 17  --   --   --  ALKPHOS 114  --   --   --   BILITOT 0.6  --   --   --   < > = values in this interval not displayed. ------------------------------------------------------------------------------------------------------------------  Cardiac Enzymes  Recent Labs Lab 07/23/15 1806 07/24/15 0234 07/24/15 0550  TROPONINI 0.05* <0.03 <0.03   ------------------------------------------------------------------------------------------------------------------  RADIOLOGY:  Dg Chest Port 1 View  07/23/2015  CLINICAL DATA:  PICC placement. EXAM: PORTABLE CHEST 1 VIEW COMPARISON:  07/20/2015. FINDINGS: Stable left subclavian and  axillary vascular stent. Interval left subclavian catheter with its tip at the junction of the superior vena cava and right atrium. No peripheral intravenous catheter seen. Borderline enlarged cardiac silhouette without significant change. Increased bilateral pleural fluid and bibasilar patchy opacity. Unremarkable bones. IMPRESSION: 1. Left subclavian catheter tip at the superior cavoatrial junction. 2. Progressive changes of congestive heart failure. Electronically Signed   By: Claudie Revering M.D.   On: 07/23/2015 20:17     ASSESSMENT AND PLAN:   54 year old female with a history of diabetes, CAD and end-stage renal disease on hemodialysis who presented with abdominal pain and diarrhea and found to have C. difficile colitis  1. C. difficile colitis: Continue oral Flagyl for total of 2 weeks and continue probiotic 2. End stage renal disease on hemodialysis Continue hemodialysis Monday, Wednesday and Friday.  3. Anemia of chronic kidney disease: Hemoglobin is stable.  4. Diabetes: Continue sliding scale insulin. Blood sugars have improved  5. History of CAD with nonsustained V. tach on November 1: Patient is planned for cardiac catheterization in the a.m. Continue amiodarone drip as recommended by cardiology. Continue aspirin, Plavix atorvastatin and Coreg.  6. Depression: Continue Celexa 7. Essential hypertension: Continue Norvasc and Coreg.    Management plans discussed with the patient and  she is in agreement.  CODE STATUS: full  TOTAL TIME TAKING CARE OF THIS PATIENT:  25 minutes.  Discussed with cardiology   POSSIBLE D/C  tomorrow , DEPENDING ON CLINICAL CONDITION.   [name] M.D on 07/25/2015 at 11:59 AM  Between 7am to 6pm - Pager - [phone] After 6pm go to [email or web address] - password [email or web address] [facility]  Office  [phone]  CC: Primary care physician; Robin Sia, MD  Note: This dictation was prepared with Dragon dictation along with  smaller phrase technology. Any transcriptional errors that result from this process are unintentional.

## 2015-07-25 NOTE — Progress Notes (Signed)
HD tx start 

## 2015-07-25 NOTE — Progress Notes (Addendum)
Nursing order specified to paged MD if QTc >/= 0.48; 1600 tele strip showed QTc of 0.48. Dr. Humphrey Rolls paged and made aware; no new orders at this time; reviewed Mg level which was WNL 11/1 at 1818. MD reports it is unnecessary to page with QTc levels and that this order can be discontinued.

## 2015-07-25 NOTE — Progress Notes (Signed)
Subjective:   Feels better today Eating breakfast when seen earlier today   Objective:  Vital signs in last 24 hours:  Temp:  [97.7 F (36.5 C)-98.4 F (36.9 C)] 97.7 F (36.5 C) (11/02 1345) Pulse Rate:  [62-72] 67 (11/02 1430) Resp:  [14-22] 16 (11/02 1430) BP: (127-151)/(53-69) 127/65 mmHg (11/02 1430) SpO2:  [98 %-100 %] 99 % (11/02 1345) Weight:  [77.9 kg (171 lb 11.8 oz)] 77.9 kg (171 lb 11.8 oz) (11/02 1345)  Weight change:  Filed Weights   07/22/15 1102 07/23/15 1000 07/25/15 1345  Weight: 84.596 kg (186 lb 8 oz) 75.9 kg (167 lb 5.3 oz) 77.9 kg (171 lb 11.8 oz)    Intake/Output: I/O last 3 completed shifts: In: 101 [IV Piggyback:101] Out: 100 [Urine:100]   Intake/Output this shift:  Total I/O In: 20 [I.V.:20] Out: 0   Physical Exam: General: NAD, cachectic  Head: Normocephalic, atraumatic. Moist oral mucosal membranes  Eyes: Anicteric,   Neck: Supple, trachea midline  Lungs:  Clear to auscultation, normal effort, Hettinger oxygen  Heart: Regular rate and rhythm  Abdomen:  Soft, left lower quadrant abdominal pain.   Extremities: No peripheral edema.  Neurologic: Nonfocal, moving all four extremities  Skin: No lesions  Access: Left arm AVG    Basic Metabolic Panel:  Recent Labs Lab 07/20/15 1301 07/22/15 1145 07/23/15 0731 07/24/15 0550 07/24/15 1818 07/25/15 0630  NA 138 143 140 137  --  134*  K 4.2 3.2* 3.5 3.8 4.0 4.2  CL 102 103 102 97*  --  96*  CO2 23 26 26 31   --  31  GLUCOSE 91 68 126* 95  --  114*  BUN 30* 38* 39* 17  --  24*  CREATININE 5.44* 7.32* 7.71* 4.72*  --  5.55*  CALCIUM 9.4 9.0 7.9* 7.7*  --  7.8*  MG  --   --   --   --  2.0  --     Liver Function Tests:  Recent Labs Lab 07/20/15 1301 07/22/15 1145  AST 37 27  ALT 19 17  ALKPHOS 115 114  BILITOT 0.7 0.6  PROT 7.2 7.7  ALBUMIN 2.6* 2.8*    Recent Labs Lab 07/20/15 1301 07/22/15 1145  LIPASE 26 29   No results for input(s): AMMONIA in the last 168  hours.  CBC:  Recent Labs Lab 07/20/15 1301 07/22/15 1145 07/23/15 0731  WBC 6.5 5.5 6.2  NEUTROABS 4.2  --   --   HGB 13.7 13.5 13.0  HCT 43.5 42.9 40.1  MCV 89.1 88.4 88.6  PLT 228 231 194    Cardiac Enzymes:  Recent Labs Lab 07/22/15 1145 07/23/15 1806 07/24/15 0234 07/24/15 0550  TROPONINI <0.03 0.05* <0.03 <0.03    BNP: Invalid input(s): POCBNP  CBG:  Recent Labs Lab 07/24/15 1705 07/24/15 2039 07/25/15 0151 07/25/15 0744 07/25/15 1125  GLUCAP 179* 142* 122* 112* 153*    Microbiology: Results for orders placed or performed during the hospital encounter of 07/22/15  C difficile quick scan w PCR reflex     Status: Abnormal   Collection Time: 07/22/15 12:40 PM  Result Value Ref Range Status   C Diff antigen POSITIVE (A) NEGATIVE Final    Comment: REPORTED TO SHANNON PATCH AT 1539PM BY TB   C Diff toxin NEGATIVE NEGATIVE Final   C Diff interpretation   Final    Positive for toxigenic C. difficile, active toxin production not detected. Patient has toxigenic C. difficile organisms present in the  bowel, but toxin was not detected. The patient may be a carrier or the level of toxin in the sample was below the limit  of detection. This information should be used in conjunction with the patient's clinical history when deciding on possible therapy.   Clostridium Difficile by PCR     Status: Abnormal   Collection Time: 07/22/15 12:40 PM  Result Value Ref Range Status   Toxigenic C Difficile by pcr POSITIVE (A) NEGATIVE Final    Comment: CRITICAL RESULT CALLED TO, READ BACK BY AND VERIFIED WITH: SHANNON PATCH ON 07/22/15 AT 1539 BY TB     Coagulation Studies: No results for input(s): LABPROT, INR in the last 72 hours.  Urinalysis: No results for input(s): COLORURINE, LABSPEC, PHURINE, GLUCOSEU, HGBUR, BILIRUBINUR, KETONESUR, PROTEINUR, UROBILINOGEN, NITRITE, LEUKOCYTESUR in the last 72 hours.  Invalid input(s): APPERANCEUR    Imaging: Dg Chest Port 1  View  07/23/2015  CLINICAL DATA:  PICC placement. EXAM: PORTABLE CHEST 1 VIEW COMPARISON:  07/20/2015. FINDINGS: Stable left subclavian and axillary vascular stent. Interval left subclavian catheter with its tip at the junction of the superior vena cava and right atrium. No peripheral intravenous catheter seen. Borderline enlarged cardiac silhouette without significant change. Increased bilateral pleural fluid and bibasilar patchy opacity. Unremarkable bones. IMPRESSION: 1. Left subclavian catheter tip at the superior cavoatrial junction. 2. Progressive changes of congestive heart failure. Electronically Signed   By: Claudie Revering M.D.   On: 07/23/2015 20:17     Medications:   . amiodarone 30 mg/hr (07/25/15 1021)   . acidophilus  1 capsule Oral Daily  . amLODipine  10 mg Oral Daily  . aspirin EC  81 mg Oral Daily  . atorvastatin  20 mg Oral QPM  . b complex-vitamin c-folic acid   Oral Daily  . carvedilol  25 mg Oral BID  . cholecalciferol  400 Units Oral Daily  . citalopram  20 mg Oral Daily  . clopidogrel  75 mg Oral Daily  . docusate sodium  100 mg Oral BID  . feeding supplement  1 Container Oral BID BM  . glucose  4 g Oral See admin instructions  . heparin subcutaneous  5,000 Units Subcutaneous 3 times per day  . insulin aspart  0-9 Units Subcutaneous TID WC  . lipase/protease/amylase  36,000 Units Oral TID WC  . loratadine  5 mg Oral Daily  . metoCLOPramide  5 mg Oral TID AC & HS  . metroNIDAZOLE  500 mg Oral 3 times per day  . pantoprazole  40 mg Oral Daily  . ranolazine  1,000 mg Oral BID  . sevelamer carbonate  800 mg Oral TID WC  . sodium chloride  10-40 mL Intracatheter Q12H  . sucralfate  1 g Oral TID WC & HS  . vitamin B-12  1,000 mcg Oral Daily     Assessment/ Plan:  [name] with long standing DM, HTN, hyperlipidemia, retinopathy, severe gastroparesis , GI bleed.   [facility] [address] MWF  1. End Stage Renal Disease: missed  dialysis on Friday. However with hypovolemia due to GI losses.  - DIalyss today   2. C. Diff colitis A04.7:   metronidazole.  - continue supportive care.  - no UF  3. Secondary Hyperparathyroidism: phos 5.6, PTH 93 as outpatient.  - will hold binders for now.   4. Anemia of chronic kidney disease: hemoglobin 13.. Most likely due to volume contraction.  - hold epo.  LOS: 3 Denene Alamillo 11/2/20162:53 PM

## 2015-07-26 ENCOUNTER — Encounter
Admission: EM | Disposition: A | Discharge status: Home / Self Care | Payer: Self-pay | Source: Home / Self Care | Attending: Internal Medicine

## 2015-07-26 ENCOUNTER — Inpatient Hospital Stay
Admission: RE | Admit: 2015-07-26 | Discharge: 2015-07-26 | Disposition: A | Discharge status: Home / Self Care | Payer: Medicare Other | Source: Ambulatory Visit | Attending: Physician Assistant | Admitting: Physician Assistant

## 2015-07-26 HISTORY — PX: CARDIAC CATHETERIZATION: SHX172

## 2015-07-26 LAB — GLUCOSE, CAPILLARY
Glucose-Capillary: 81 mg/dL (ref 65–99)
Glucose-Capillary: 72 mg/dL (ref 65–99)
Glucose-Capillary: 109 mg/dL — ABNORMAL HIGH (ref 65–99)
Glucose-Capillary: 54 mg/dL — ABNORMAL LOW (ref 65–99)
Glucose-Capillary: 72 mg/dL (ref 65–99)

## 2015-07-26 LAB — BASIC METABOLIC PANEL
Anion gap: 4 — ABNORMAL LOW (ref 5–15)
BUN: 12 mg/dL (ref 6–20)
CO2: 33 mmol/L — ABNORMAL HIGH (ref 22–32)
Calcium: 7.8 mg/dL — ABNORMAL LOW (ref 8.9–10.3)
Chloride: 99 mmol/L — ABNORMAL LOW (ref 101–111)
Creatinine, Ser: 3.6 mg/dL — ABNORMAL HIGH (ref 0.44–1.00)
GFR calc Af Amer: 15 mL/min — ABNORMAL LOW (ref >60)
GFR calc non Af Amer: 13 mL/min — ABNORMAL LOW (ref >60)
Glucose, Bld: 88 mg/dL (ref 65–99)
Potassium: 4.1 mmol/L (ref 3.5–5.1)
Sodium: 136 mmol/L (ref 135–145)

## 2015-07-26 LAB — CBC
HCT: 35.7 % (ref 35.0–47.0)
Hemoglobin: 11.5 g/dL — ABNORMAL LOW (ref 12.0–16.0)
MCH: 28.2 pg (ref 26.0–34.0)
MCHC: 32.2 g/dL (ref 32.0–36.0)
MCV: 87.5 fL (ref 80.0–100.0)
Platelets: 141 10*3/uL — ABNORMAL LOW (ref 150–440)
RBC: 4.08 MIL/uL (ref 3.80–5.20)
RDW: 19.8 % — ABNORMAL HIGH (ref 11.5–14.5)
WBC: 6 10*3/uL (ref 3.6–11.0)

## 2015-07-26 LAB — PROTIME-INR
INR: 1.01
Prothrombin Time: 13.5 seconds (ref 11.4–15.0)

## 2015-07-26 SURGERY — LEFT HEART CATH AND CORONARY ANGIOGRAPHY
Anesthesia: Moderate Sedation | Laterality: Left

## 2015-07-26 MED ORDER — SODIUM CHLORIDE 0.9 % IJ SOLN: 3.0000 mL | Freq: Two times a day (BID) | INTRAMUSCULAR | Status: DC

## 2015-07-26 MED ORDER — SODIUM CHLORIDE 0.9 % IV SOLN: 250.0000 mL | INTRAVENOUS | Status: DC | PRN

## 2015-07-26 MED ORDER — ACETAMINOPHEN 325 MG PO TABS: 650.0000 mg | ORAL_TABLET | ORAL | Status: DC | PRN

## 2015-07-26 MED ORDER — ASPIRIN 81 MG PO CHEW: 81.0000 mg | CHEWABLE_TABLET | ORAL | Status: DC

## 2015-07-26 MED ORDER — SODIUM CHLORIDE 0.9 % WEIGHT BASED INFUSION: 3.0000 mL/kg/h | INTRAVENOUS | Status: DC

## 2015-07-26 MED ORDER — MIDAZOLAM HCL 2 MG/2ML IJ SOLN
INTRAMUSCULAR | Status: AC
  Filled 2015-07-26: qty 2

## 2015-07-26 MED ORDER — SODIUM CHLORIDE 0.9 % WEIGHT BASED INFUSION: 1.0000 mL/kg/h | INTRAVENOUS | Status: DC

## 2015-07-26 MED ORDER — SODIUM CHLORIDE 0.9 % IJ SOLN: 3.0000 mL | INTRAMUSCULAR | Status: DC | PRN

## 2015-07-26 MED ORDER — MIDAZOLAM HCL 2 MG/2ML IJ SOLN
INTRAMUSCULAR | Status: DC | PRN
  Administered 2015-07-26: 1 mg via INTRAVENOUS

## 2015-07-26 MED ORDER — IOHEXOL 300 MG/ML  SOLN
INTRAMUSCULAR | Status: DC | PRN
  Administered 2015-07-26: 80 mL via INTRAVENOUS
  Administered 2015-07-26: 30 mL via INTRAVENOUS

## 2015-07-26 MED ORDER — AMIODARONE HCL 200 MG PO TABS
400.0000 mg | ORAL_TABLET | Freq: Every day | ORAL | Status: DC
  Administered 2015-07-26: 400 mg via ORAL
  Filled 2015-07-26: qty 2

## 2015-07-26 MED ORDER — FENTANYL CITRATE (PF) 100 MCG/2ML IJ SOLN
INTRAMUSCULAR | Status: DC | PRN
  Administered 2015-07-26: 50 ug via INTRAVENOUS

## 2015-07-26 MED ORDER — SODIUM CHLORIDE 0.9 % IV SOLN
INTRAVENOUS | Status: DC
  Administered 2015-07-26: 15:00:00 via INTRAVENOUS

## 2015-07-26 MED ORDER — FENTANYL CITRATE (PF) 100 MCG/2ML IJ SOLN
INTRAMUSCULAR | Status: AC
  Filled 2015-07-26: qty 2

## 2015-07-26 MED ORDER — HEPARIN (PORCINE) IN NACL 2-0.9 UNIT/ML-% IJ SOLN
INTRAMUSCULAR | Status: AC
  Filled 2015-07-26: qty 1000

## 2015-07-26 MED ORDER — ONDANSETRON HCL 4 MG/2ML IJ SOLN: 4.0000 mg | Freq: Four times a day (QID) | INTRAMUSCULAR | Status: DC | PRN

## 2015-07-26 MED ORDER — AMIODARONE HCL 400 MG PO TABS: 400.0000 mg | ORAL_TABLET | Freq: Every day | ORAL | Status: DC

## 2015-07-26 SURGICAL SUPPLY — 10 items
CATH INFINITI 5 FR 3DRC (CATHETERS) ×2 IMPLANT
CATH INFINITI 5FR ANG PIGTAIL (CATHETERS) ×2 IMPLANT
CATH INFINITI 5FR JL4 (CATHETERS) ×2 IMPLANT
CATH INFINITI JR4 5F (CATHETERS) ×2 IMPLANT
DEVICE CLOSURE MYNXGRIP 5F (Vascular Products) ×2 IMPLANT
KIT MANI 3VAL PERCEP (MISCELLANEOUS) ×2 IMPLANT
NEEDLE PERC 18GX7CM (NEEDLE) ×2 IMPLANT
PACK CARDIAC CATH (CUSTOM PROCEDURE TRAY) ×2 IMPLANT
SHEATH PINNACLE 5F 10CM (SHEATH) ×2 IMPLANT
WIRE EMERALD 3MM-J .035X150CM (WIRE) ×2 IMPLANT

## 2015-07-26 NOTE — Progress Notes (Signed)
[facility] [place] NAME: [name]    MR#:  [id number]  DATE OF BIRTH:  1961-06-02  SUBJECTIVE:  Patient is doing well this morning. Patient denies nausea or vomiting. Patient has no chest pain. Plan for cardiac catheterization this afternoon. REVIEW OF SYSTEMS:    Review of Systems  Constitutional: Negative for fever, chills and malaise/fatigue.  HENT: Negative for sore throat.   Eyes: Negative for blurred vision.  Respiratory: Negative for cough, hemoptysis, shortness of breath and wheezing.   Cardiovascular: Negative for chest pain, palpitations and leg swelling.  Gastrointestinal: Negative for nausea, vomiting, abdominal pain, diarrhea and blood in stool.  Genitourinary: Negative for dysuria.  Musculoskeletal: Negative for back pain.  Neurological: Negative for dizziness, tremors and headaches.  Endo/Heme/Allergies: Does not bruise/bleed easily.    Tolerating Diet: Yes     DRUG ALLERGIES:   Allergies  Allergen Reactions  . Compazine [Prochlorperazine Edisylate] Anaphylaxis and Nausea And Vomiting  . Ace Inhibitors Swelling  . Ativan [Lorazepam] Other (See Comments)    Reaction:  Hallucinations and headaches  . Codeine Nausea And Vomiting  . Gabapentin Other (See Comments)    Reaction:  Unknown   . Losartan Other (See Comments)    Reaction:  Unknown   . Scopolamine Other (See Comments)    Reaction:  Unknown   . Zofran [Ondansetron Hcl] Other (See Comments)    Reaction:  hallucinations   . Oxycodone Anxiety  . Tape Rash    VITALS:  Blood pressure 138/60, pulse 70, temperature 98.6 F (37 C), temperature source Oral, resp. rate 18, height 5' 5.5" (1.664 m), weight 77.9 kg (171 lb 11.8 oz), SpO2 93 %.  PHYSICAL EXAMINATION:   Physical Exam  Constitutional: She is oriented to person, place, and time and well-developed, well-nourished, and in no distress. No distress.  HENT:  Head: Normocephalic.  Eyes: No  scleral icterus.  Neck: Normal range of motion. Neck supple. No JVD present. No tracheal deviation present.  Cardiovascular: Normal rate, regular rhythm and normal heart sounds.  Exam reveals no gallop and no friction rub.   No murmur heard. Pulmonary/Chest: Effort normal and breath sounds normal. No respiratory distress. She has no wheezes. She has no rales. She exhibits no tenderness.  Abdominal: Soft. Bowel sounds are normal. She exhibits no distension and no mass. There is no tenderness. There is no rebound and no guarding.  Musculoskeletal: Normal range of motion. She exhibits no edema.  Neurological: She is alert and oriented to person, place, and time.  Skin: Skin is warm. No rash noted. No erythema.  Psychiatric: Affect and judgment normal.      LABORATORY PANEL:   CBC  Recent Labs Lab 07/26/15 0349  WBC 6.0  HGB 11.5*  HCT 35.7  PLT 141*   ------------------------------------------------------------------------------------------------------------------  Chemistries   Recent Labs Lab 07/22/15 1145  07/24/15 1818  07/26/15 0349  NA 143  < >  --   < > 136  K 3.2*  < > 4.0  < > 4.1  CL 103  < >  --   < > 99*  CO2 26  < >  --   < > 33*  GLUCOSE 68  < >  --   < > 88  BUN 38*  < >  --   < > 12  CREATININE 7.32*  < >  --   < > 3.60*  CALCIUM 9.0  < >  --   < >  7.8*  MG  --   --  2.0  --   --   AST 27  --   --   --   --   ALT 17  --   --   --   --   ALKPHOS 114  --   --   --   --   BILITOT 0.6  --   --   --   --   < > = values in this interval not displayed. ------------------------------------------------------------------------------------------------------------------  Cardiac Enzymes  Recent Labs Lab 07/23/15 1806 07/24/15 0234 07/24/15 0550  TROPONINI 0.05* <0.03 <0.03   ------------------------------------------------------------------------------------------------------------------  RADIOLOGY:  No results found.   ASSESSMENT AND PLAN:    54 year old female with a history of diabetes, CAD and end-stage renal disease on hemodialysis who presented with abdominal pain and diarrhea and found to have C. difficile colitis  1. C. difficile colitis: Continue oral Flagyl for total of 2 weeks and continue probiotic 2. End stage renal disease on hemodialysis Continue hemodialysis Monday, Wednesday and Friday.  3. Anemia of chronic kidney disease: Hemoglobin is stable.  4. Diabetes: Continue sliding scale insulin. Blood sugars have improved  5. History of CAD with nonsustained V. tach on November 1: Patient is planned for cardiac catheterization this afternoon . Amiodarone drip was changed to oral amiodarone and will be tapered as per cardiology as outpatient. Continue aspirin, Plavix atorvastatin and Coreg.  6. Depression: Continue Celexa 7. Essential hypertension: Continue Norvasc and Coreg.    Management plans discussed with the patient and  she is in agreement.  CODE STATUS: full  TOTAL TIME TAKING CARE OF THIS PATIENT:  25 minutes.  Discussed with cardiology   POSSIBLE D/C this afternoon after cardiac catheterization is no stent placed , DEPENDING ON CLINICAL CONDITION.   Ladena Jacquez M.D on 07/26/2015 at 12:33 PM  Between 7am to 6pm - Pager - [phone] After 6pm go to [email or web address] - password [email or web address] [facility]  Office  [phone]  CC: Primary care physician; Ellamae Sia, MD  Note: This dictation was prepared with Dragon dictation along with smaller phrase technology. Any transcriptional errors that result from this process are unintentional.

## 2015-07-26 NOTE — Progress Notes (Signed)
Subjective:   Feels better today Scheduled for cardiac catheterization later today   Objective:  Vital signs in last 24 hours:  Temp:  [97.8 F (36.6 C)-98.6 F (37 C)] 98.2 F (36.8 C) (11/03 1446) Pulse Rate:  [57-70] 66 (11/03 1446) Resp:  [16-18] 18 (11/03 1446) BP: (102-153)/(43-80) 131/62 mmHg (11/03 1446) SpO2:  [93 %-100 %] 95 % (11/03 1446)  Weight change:  Filed Weights   07/22/15 1102 07/23/15 1000 07/25/15 1345  Weight: 84.596 kg (186 lb 8 oz) 75.9 kg (167 lb 5.3 oz) 77.9 kg (171 lb 11.8 oz)    Intake/Output: I/O last 3 completed shifts: In: 20 [I.V.:20] Out: 100 [Urine:100]   Intake/Output this shift:  Total I/O In: 66.8 [I.V.:66.8] Out: -   Physical Exam: General: NAD, cachectic  Head: Normocephalic, atraumatic. Moist oral mucosal membranes  Eyes: Anicteric,   Neck: Supple, trachea midline  Lungs:  Clear to auscultation, normal effort, [place] oxygen  Heart: Regular rate and rhythm  Abdomen:  Soft, left lower quadrant abdominal pain.   Extremities: No peripheral edema.  Neurologic: Nonfocal, moving all four extremities  Skin: No lesions  Access: Left arm AVG    Basic Metabolic Panel:  Recent Labs Lab 07/22/15 1145 07/23/15 0731 07/24/15 0550 07/24/15 1818 07/25/15 0630 07/25/15 2015 07/26/15 0349  NA 143 140 137  --  134*  --  136  K 3.2* 3.5 3.8 4.0 4.2  --  4.1  CL 103 102 97*  --  96*  --  99*  CO2 26 26 31   --  31  --  33*  GLUCOSE 68 126* 95  --  114*  --  88  BUN 38* 39* 17  --  24*  --  12  CREATININE 7.32* 7.71* 4.72*  --  5.55*  --  3.60*  CALCIUM 9.0 7.9* 7.7*  --  7.8*  --  7.8*  MG  --   --   --  2.0  --   --   --   PHOS  --   --   --   --   --  2.7  --     Liver Function Tests:  Recent Labs Lab 07/20/15 1301 07/22/15 1145  AST 37 27  ALT 19 17  ALKPHOS 115 114  BILITOT 0.7 0.6  PROT 7.2 7.7  ALBUMIN 2.6* 2.8*    Recent Labs Lab 07/20/15 1301 07/22/15 1145  LIPASE 26 29   No results for input(s): AMMONIA  in the last 168 hours.  CBC:  Recent Labs Lab 07/20/15 1301 07/22/15 1145 07/23/15 0731 07/25/15 2015 07/26/15 0349  WBC 6.5 5.5 6.2 6.2 6.0  NEUTROABS 4.2  --   --   --   --   HGB 13.7 13.5 13.0 12.1 11.5*  HCT 43.5 42.9 40.1 38.2 35.7  MCV 89.1 88.4 88.6 88.1 87.5  PLT 228 231 194 135* 141*    Cardiac Enzymes:  Recent Labs Lab 07/22/15 1145 07/23/15 1806 07/24/15 0234 07/24/15 0550  TROPONINI <0.03 0.05* <0.03 <0.03    BNP: Invalid input(s): POCBNP  CBG:  Recent Labs Lab 07/25/15 2033 07/25/15 2342 07/26/15 0400 07/26/15 0736 07/26/15 1205  GLUCAP 99 118* 56 72 109*    Microbiology: Results for orders placed or performed during the hospital encounter of 07/22/15  C difficile quick scan w PCR reflex     Status: Abnormal   Collection Time: 07/22/15 12:40 PM  Result Value Ref Range Status   C Diff antigen POSITIVE (  A) NEGATIVE Final    Comment: REPORTED TO SHANNON PATCH AT 1539PM BY TB   C Diff toxin NEGATIVE NEGATIVE Final   C Diff interpretation   Final    Positive for toxigenic C. difficile, active toxin production not detected. Patient has toxigenic C. difficile organisms present in the bowel, but toxin was not detected. The patient may be a carrier or the level of toxin in the sample was below the limit  of detection. This information should be used in conjunction with the patient's clinical history when deciding on possible therapy.   Clostridium Difficile by PCR     Status: Abnormal   Collection Time: 07/22/15 12:40 PM  Result Value Ref Range Status   Toxigenic C Difficile by pcr POSITIVE (A) NEGATIVE Final    Comment: CRITICAL RESULT CALLED TO, READ BACK BY AND VERIFIED WITH: SHANNON PATCH ON 07/22/15 AT 1539 BY TB     Coagulation Studies:  Recent Labs  07/26/15 0713  LABPROT 13.5  INR 1.01    Urinalysis: No results for input(s): COLORURINE, LABSPEC, PHURINE, GLUCOSEU, HGBUR, BILIRUBINUR, KETONESUR, PROTEINUR, UROBILINOGEN, NITRITE,  LEUKOCYTESUR in the last 72 hours.  Invalid input(s): APPERANCEUR    Imaging: No results found.   Medications:   . sodium chloride 20 mL/hr at 07/26/15 1500  . [START ON 07/27/2015] sodium chloride     Followed by  . [START ON 07/27/2015] sodium chloride    . sodium chloride     . acidophilus  1 capsule Oral Daily  . amiodarone  400 mg Oral Daily  . amLODipine  10 mg Oral Daily  . [START ON 07/27/2015] aspirin  81 mg Oral Pre-Cath  . aspirin EC  81 mg Oral Daily  . atorvastatin  20 mg Oral QPM  . b complex-vitamin c-folic acid   Oral Daily  . carvedilol  25 mg Oral BID  . cholecalciferol  400 Units Oral Daily  . citalopram  20 mg Oral Daily  . clopidogrel  75 mg Oral Daily  . docusate sodium  100 mg Oral BID  . feeding supplement  1 Container Oral BID BM  . glucose  4 g Oral See admin instructions  . heparin subcutaneous  5,000 Units Subcutaneous 3 times per day  . insulin aspart  0-9 Units Subcutaneous TID WC  . lipase/protease/amylase  36,000 Units Oral TID WC  . loratadine  5 mg Oral Daily  . metoCLOPramide  5 mg Oral TID AC & HS  . metroNIDAZOLE  500 mg Oral 3 times per day  . pantoprazole  40 mg Oral Daily  . ranolazine  1,000 mg Oral BID  . sevelamer carbonate  800 mg Oral TID WC  . sodium chloride  10-40 mL Intracatheter Q12H  . sodium chloride  3 mL Intravenous Q12H  . sodium chloride  3 mL Intravenous Q12H  . sucralfate  1 g Oral TID WC & HS  . vitamin B-12  1,000 mcg Oral Daily     Assessment/ Plan:  [name] with long standing DM, HTN, hyperlipidemia, retinopathy, severe gastroparesis , GI bleed.   [facility] [address] MWF  1. End Stage Renal Disease: missed dialysis on Friday. However with hypovolemia due to GI losses.  - dialysis tomorrow  2. C. Diff colitis A04.7:   metronidazole.  - continue supportive care.  - no UF  3. Secondary Hyperparathyroidism: phos 5.6, PTH 93 as outpatient.  - will hold binders for now.  4. Anemia of chronic kidney disease: hemoglobin 11.5.. Most likely due to volume contraction.  - hold epo.   5.  Cardiac cath Scheduled for later today   LOS: 4 [name] 11/3/20163:29 PM

## 2015-07-26 NOTE — Progress Notes (Signed)
SUBJECTIVE: feeling better   Filed Vitals:   07/25/15 2338 07/26/15 0400 07/26/15 0624 07/26/15 0805  BP: 121/43 102/80 114/46 119/44  Pulse: 64 57 61 63  Temp: 98.3 F (36.8 C) 98.5 F (36.9 C) 98.6 F (37 C) 98.6 F (37 C)  TempSrc: Oral Oral Oral Oral  Resp: 16 18 18 18   Height:      Weight:      SpO2: 93% 100% 96% 93%    Intake/Output Summary (Last 24 hours) at 07/26/15 0856 Last data filed at 07/25/15 1900  Gross per 24 hour  Intake     20 ml  Output      0 ml  Net     20 ml    LABS: Basic Metabolic Panel:  Recent Labs  07/24/15 1818 07/25/15 0630 07/25/15 2015 07/26/15 0349  NA  --  134*  --  136  K 4.0 4.2  --  4.1  CL  --  96*  --  99*  CO2  --  31  --  33*  GLUCOSE  --  114*  --  88  BUN  --  24*  --  12  CREATININE  --  5.55*  --  3.60*  CALCIUM  --  7.8*  --  7.8*  MG 2.0  --   --   --   PHOS  --   --  2.7  --    Liver Function Tests: No results for input(s): AST, ALT, ALKPHOS, BILITOT, PROT, ALBUMIN in the last 72 hours. No results for input(s): LIPASE, AMYLASE in the last 72 hours. CBC:  Recent Labs  07/25/15 2015 07/26/15 0349  WBC 6.2 6.0  HGB 12.1 11.5*  HCT 38.2 35.7  MCV 88.1 87.5  PLT 135* 141*   Cardiac Enzymes:  Recent Labs  07/23/15 1806 07/24/15 0234 07/24/15 0550  TROPONINI 0.05* <0.03 <0.03   BNP: Invalid input(s): POCBNP D-Dimer: No results for input(s): DDIMER in the last 72 hours. Hemoglobin A1C: No results for input(s): HGBA1C in the last 72 hours. Fasting Lipid Panel: No results for input(s): CHOL, HDL, LDLCALC, TRIG, CHOLHDL, LDLDIRECT in the last 72 hours. Thyroid Function Tests: No results for input(s): TSH, T4TOTAL, T3FREE, THYROIDAB in the last 72 hours.  Invalid input(s): FREET3 Anemia Panel: No results for input(s): VITAMINB12, FOLATE, FERRITIN, TIBC, IRON, RETICCTPCT in the last 72 hours.   PHYSICAL EXAM General: Well developed, well nourished, in no acute distress HEENT:  Normocephalic and  atramatic Neck:  No JVD.  Lungs: Clear bilaterally to auscultation and percussion. Heart: HRRR . Normal S1 and S2 without gallops or murmurs.  Abdomen: Bowel sounds are positive, abdomen soft and non-tender  Msk:  Back normal, normal gait. Normal strength and tone for age. Extremities: No clubbing, cyanosis or edema.   Neuro: Alert and oriented X 3. Psych:  Good affect, responds appropriately  TELEMETRY: NSR  ASSESSMENT AND PLAN: Run of non-sustained VT/CAD, mildly elevated troponins, will do cath today.  Principal Problem:   C. difficile diarrhea    Mireille Lacombe A, MD, [facility] 07/26/2015 8:56 AM

## 2015-07-26 NOTE — Discharge Summary (Signed)
[facility] [place] NAME: [name]    MR#:  [id number]  DATE OF BIRTH:  07/26/1961  DATE OF ADMISSION:  07/22/2015 ADMITTING PHYSICIAN: Vaughan Basta, MD  DATE OF DISCHARGE: 07/26/2015  PRIMARY CARE PHYSICIAN: Ellamae Sia, MD    ADMISSION DIAGNOSIS:  Nausea [R11.0] Clostridium difficile colitis [A04.7]  DISCHARGE DIAGNOSIS:  Principal Problem:   C. difficile diarrhea   SECONDARY DIAGNOSIS:   Past Medical History  Diagnosis Date  . Asthma   . Collagen vascular disease ([place])   . Diabetes mellitus without complication ([place])   . Hypertension   . Coronary artery disease     a. cath 2013: stenting to RCA (report not available); b. cath 2014: LM nl, pLAD 40%, mLAD nl, ost LCx 40%, mid LCx nl, pRCA 30% @ site of prior stent, mRCA 50%  . Diabetic neuropathy ([place])   . ESRD (end stage renal disease) on dialysis ([place])     M-W-F  . GERD (gastroesophageal reflux disease)   . Hx of pancreatitis 2015  . Myocardial infarction ([place])   . Pneumonia   . Mitral regurgitation     a. echo 10/2013: EF 62%, noWMA, mildly dilated LA, mild to mod MR/TR, GR1DD  . COPD (chronic obstructive pulmonary disease) ([place])   . chronic diastolic CHF [id number]  . dialysis 2006    HOSPITAL COURSE:  54 year old female with a history of diabetes, CAD and end-stage renal disease on hemodialysis who presented with abdominal pain and diarrhea and found to have C. difficile colitis  1. C. difficile colitis: Continue oral Flagyl for total of 2 weeks and continue probiotic 2. End stage renal disease on hemodialysis Continue hemodialysis Monday, Wednesday and Friday.  3. Anemia of chronic kidney disease: Hemoglobin is stable.  4. Diabetes: Continue sliding scale insulin. Blood sugars have improved  5. History of CAD with nonsustained V. tach on November 1: Patient underwent cardiac catheterization which showed patent stent in RCA. Amiodarone drip  was changed to oral amiodarone and will be tapered as per cardiology as outpatient. Continue aspirin, Plavix atorvastatin and Coreg.  6. Depression: Continue Celexa 7. Essential hypertension: Continue Norvasc and Coreg.   DISCHARGE CONDITIONS AND DIET:  Home stable condition  Renal diet  CONSULTS OBTAINED:  Treatment Team:  Lavonia Dana, MD Dionisio David, MD  DRUG ALLERGIES:   Allergies  Allergen Reactions  . Compazine [Prochlorperazine Edisylate] Anaphylaxis and Nausea And Vomiting  . Ace Inhibitors Swelling  . Ativan [Lorazepam] Other (See Comments)    Reaction:  Hallucinations and headaches  . Codeine Nausea And Vomiting  . Gabapentin Other (See Comments)    Reaction:  Unknown   . Losartan Other (See Comments)    Reaction:  Unknown   . Scopolamine Other (See Comments)    Reaction:  Unknown   . Zofran [Ondansetron Hcl] Other (See Comments)    Reaction:  hallucinations   . Oxycodone Anxiety  . Tape Rash    DISCHARGE MEDICATIONS:   Current Discharge Medication List    START taking these medications   Details  acidophilus (RISAQUAD) CAPS capsule Take 1 capsule by mouth daily. Qty: 30 capsule, Refills: 0    amiodarone (PACERONE) 400 MG tablet Take 1 tablet (400 mg total) by mouth daily. Qty: 30 tablet, Refills: 0    metroNIDAZOLE (FLAGYL) 500 MG tablet Take 1 tablet (500 mg total) by mouth every 8 (eight) hours. Qty: 36 tablet, Refills: 0  CONTINUE these medications which have NOT CHANGED   Details  albuterol (PROVENTIL HFA;VENTOLIN HFA) 108 (90 BASE) MCG/ACT inhaler Inhale 4-6 puffs by mouth every 4 hours as needed for wheezing, cough, and/or shortness of breath Qty: 1 Inhaler, Refills: 1    albuterol (PROVENTIL) (2.5 MG/3ML) 0.083% nebulizer solution Take 2.5 mg by nebulization every 4 (four) hours as needed for wheezing or shortness of breath.    amLODipine (NORVASC) 10 MG tablet Take 10 mg by mouth daily. *Take after dialysis on dialysis day*     aspirin EC 81 MG tablet Take 81 mg by mouth daily.    atorvastatin (LIPITOR) 20 MG tablet Take 20 mg by mouth every evening.    B Complex-C-Biotin-E-FA (RENATABS PO) Take 1 tablet by mouth daily.    carvedilol (COREG) 25 MG tablet Take 25 mg by mouth 2 (two) times daily.    cetirizine (ZYRTEC) 10 MG tablet Take 10 mg by mouth daily.    citalopram (CELEXA) 20 MG tablet Take 20 mg by mouth daily.    clopidogrel (PLAVIX) 75 MG tablet Take 75 mg by mouth daily.    Dextrose, Diabetic Use, (DEX4 GLUCOSE) 1 G CHEW Chew 3-4 g by mouth See admin instructions. Chew and swallow 3-4 tablets (3-4 grams) orally as needed if sugar check is less than 60.    docusate sodium (COLACE) 100 MG capsule Take 1 capsule (100 mg total) by mouth 2 (two) times daily. Qty: 60 capsule, Refills: 0    HYDROcodone-acetaminophen (NORCO/VICODIN) 5-325 MG per tablet Take 1 tablet by mouth 3 (three) times daily as needed for severe pain (for severe chronic pain.).     hydrocortisone cream 1 % Apply 1 application topically 2 (two) times daily.    lipase/protease/amylase (CREON) 12000 UNITS CPEP capsule Take 36,000 Units by mouth 3 (three) times daily with meals.     metoCLOPramide (REGLAN) 5 MG tablet Take 1 tablet (5 mg total) by mouth 4 (four) times daily -  before meals and at bedtime. Qty: 40 tablet, Refills: 0    nitroGLYCERIN (NITROSTAT) 0.4 MG SL tablet Place 0.4 mg under the tongue every 5 (five) minutes x 3 doses as needed for chest pain. *Max 3 doses per episode*    pantoprazole (PROTONIX) 40 MG tablet Take 40 mg by mouth daily.    promethazine (PHENERGAN) 12.5 MG tablet Take 12.5 mg by mouth every 6 (six) hours as needed for nausea or vomiting.    promethazine (PHENERGAN) 25 MG suppository Place 1 suppository (25 mg total) rectally every 8 (eight) hours as needed for nausea or vomiting. Qty: 12 each, Refills: 0    ranitidine (ZANTAC) 150 MG tablet Take 150 mg by mouth daily.     ranolazine (RANEXA)  1000 MG SR tablet Take 1,000 mg by mouth 2 (two) times daily.    sevelamer carbonate (RENVELA) 800 MG tablet Take 800 mg by mouth 3 (three) times daily with meals.     sucralfate (CARAFATE) 1 G tablet Take 1 tablet (1 g total) by mouth 4 (four) times daily -  with meals and at bedtime. Qty: 120 tablet, Refills: 0    vitamin B-12 (CYANOCOBALAMIN) 1000 MCG tablet Take 1,000 mcg by mouth daily.    Vitamin D, Cholecalciferol, 400 UNITS TABS Take 400 Units by mouth daily.     guaiFENesin-dextromethorphan (ROBITUSSIN DM) 100-10 MG/5ML syrup Take 10 mLs by mouth every 6 (six) hours as needed for cough. Qty: 118 mL, Refills: 0    ondansetron (ZOFRAN ODT) 4  MG disintegrating tablet Take 1 tablet (4 mg total) by mouth every 8 (eight) hours as needed for nausea or vomiting. Qty: 20 tablet, Refills: 0    prochlorperazine (COMPAZINE) 5 MG tablet Take 1 tablet (5 mg total) by mouth every 6 (six) hours as needed for nausea or vomiting. Qty: 30 tablet, Refills: 1      STOP taking these medications     nystatin (MYCOSTATIN) 100000 UNIT/ML suspension               Today   CHIEF COMPLAINT:  Doing well this am no acute events overnight   VITAL SIGNS:  Blood pressure 138/60, pulse 70, temperature 98.6 F (37 C), temperature source Oral, resp. rate 18, height 5' 5.5" (1.664 m), weight 77.9 kg (171 lb 11.8 oz), SpO2 93 %.   REVIEW OF SYSTEMS:  Review of Systems  Constitutional: Negative for fever, chills and malaise/fatigue.  HENT: Negative for sore throat.   Eyes: Negative for blurred vision.  Respiratory: Negative for cough, hemoptysis, shortness of breath and wheezing.   Cardiovascular: Negative for chest pain, palpitations and leg swelling.  Gastrointestinal: Negative for nausea, vomiting, abdominal pain, diarrhea and blood in stool.  Genitourinary: Negative for dysuria.  Musculoskeletal: Negative for back pain.  Neurological: Negative for dizziness, tremors and headaches.   Endo/Heme/Allergies: Does not bruise/bleed easily.     PHYSICAL EXAMINATION:  GENERAL:  54 y.o.-year-old patient lying in the bed with no acute distress.  NECK:  Supple, no jugular venous distention. No thyroid enlargement, no tenderness.  LUNGS: Normal breath sounds bilaterally, no wheezing, rales,rhonchi  No use of accessory muscles of respiration.  CARDIOVASCULAR: S1, S2 normal. No murmurs, rubs, or gallops.  ABDOMEN: Soft, non-tender, non-distended. Bowel sounds present. No organomegaly or mass.  EXTREMITIES: No pedal edema, cyanosis, or clubbing.  PSYCHIATRIC: The patient is alert and oriented x 3.  SKIN: No obvious rash, lesion, or ulcer.   DATA REVIEW:   CBC  Recent Labs Lab 07/26/15 0349  WBC 6.0  HGB 11.5*  HCT 35.7  PLT 141*    Chemistries   Recent Labs Lab 07/22/15 1145  07/24/15 1818  07/26/15 0349  NA 143  < >  --   < > 136  K 3.2*  < > 4.0  < > 4.1  CL 103  < >  --   < > 99*  CO2 26  < >  --   < > 33*  GLUCOSE 68  < >  --   < > 88  BUN 38*  < >  --   < > 12  CREATININE 7.32*  < >  --   < > 3.60*  CALCIUM 9.0  < >  --   < > 7.8*  MG  --   --  2.0  --   --   AST 27  --   --   --   --   ALT 17  --   --   --   --   ALKPHOS 114  --   --   --   --   BILITOT 0.6  --   --   --   --   < > = values in this interval not displayed.  Cardiac Enzymes  Recent Labs Lab 07/23/15 1806 07/24/15 0234 07/24/15 0550  TROPONINI 0.05* <0.03 <0.03    Microbiology Results  @[email or web address] @  RADIOLOGY:  No results found.    Management plans discussed with the patient and she is  in agreement. Stable for discharge home  Patient should follow up with Dr Humphrey Rolls in 1 week  CODE STATUS:     Code Status Orders        Start     Ordered   07/22/15 1737  Full code   Continuous     07/22/15 1736    Advance Directive Documentation        Most Recent Value   Type of Advance Directive  Healthcare Power of Attorney   Pre-existing out of facility DNR order  (yellow form or pink MOST form)     "MOST" Form in Place?        TOTAL TIME TAKING CARE OF THIS PATIENT: 35 minutes.    Note: This dictation was prepared with Dragon dictation along with smaller phrase technology. Any transcriptional errors that result from this process are unintentional.  Angelise Petrich M.D on 07/26/2015 at 12:37 PM  Between 7am to 6pm - Pager - [phone] After 6pm go to [email or web address] - password [email or web address] [facility]  Office  [phone]  CC: Primary care physician; Ellamae Sia, MD

## 2015-07-26 NOTE — Care Management (Addendum)
Patient back from cath lab  and stent on RCA was patent.  Amiodarone drip was stopped around 2pm and started on oral .  Attending spoke with cardiology and informed that patient could discharge today on the oral amiodarone.    Referral to [facility]: Lambert Rhonda W to resume nursing, PT and aide.  Referral called and faxed.patient says that none of her family can transport her home and it is too late for ACTA (which is true).  Patient says she is not steady on her feet at baseline so would not be safe to send home alone in a taxi.  Patient says all of her family works third shift and are sleeping.  CM called patient's son [name] and he informed CM that family would transport patient home.  Patient notified.

## 2015-07-26 NOTE — Progress Notes (Signed)
Pt returned from cath, per MD ok to d/c to home per cardiology. Cath site clean and PO Amio given to pt along with the remainder of AM and evening meds. PICC and tele removed and pt currently awaiting son for d/c to home.

## 2015-07-26 NOTE — Progress Notes (Signed)
Stent in RCA patent on cath. Patient may go home on 400 po amiodrone daily and rest of medication with f/u Tuesday 2pm.

## 2015-07-26 NOTE — Progress Notes (Signed)
*  PRELIMINARY RESULTS* Echocardiogram 2D Echocardiogram has been performed.  [name] 07/26/2015, 3:08 PM

## 2015-07-26 NOTE — Progress Notes (Signed)
Patient left floor for cardiac catheterization at 1430.  Echo completed prior.  VSS, patient is pain free at this time.    Bruit and thrill present on left upper arm.    Patient requested for daily medications to be given post cath since she hadn't had anything to eat and didn't want to get sick.

## 2015-07-27 ENCOUNTER — Encounter: Payer: Self-pay | Admitting: Cardiovascular Disease

## 2015-08-07 IMAGING — CR DG CHEST 1V PORT
1 series · 1 of 1 positions shown · non-contrast
Comparison: 03/14/2015 and prior radiographs

CLINICAL DATA: Hypoxia and shortness of breath.

EXAM:
PORTABLE CHEST - 1 VIEW

[ap]
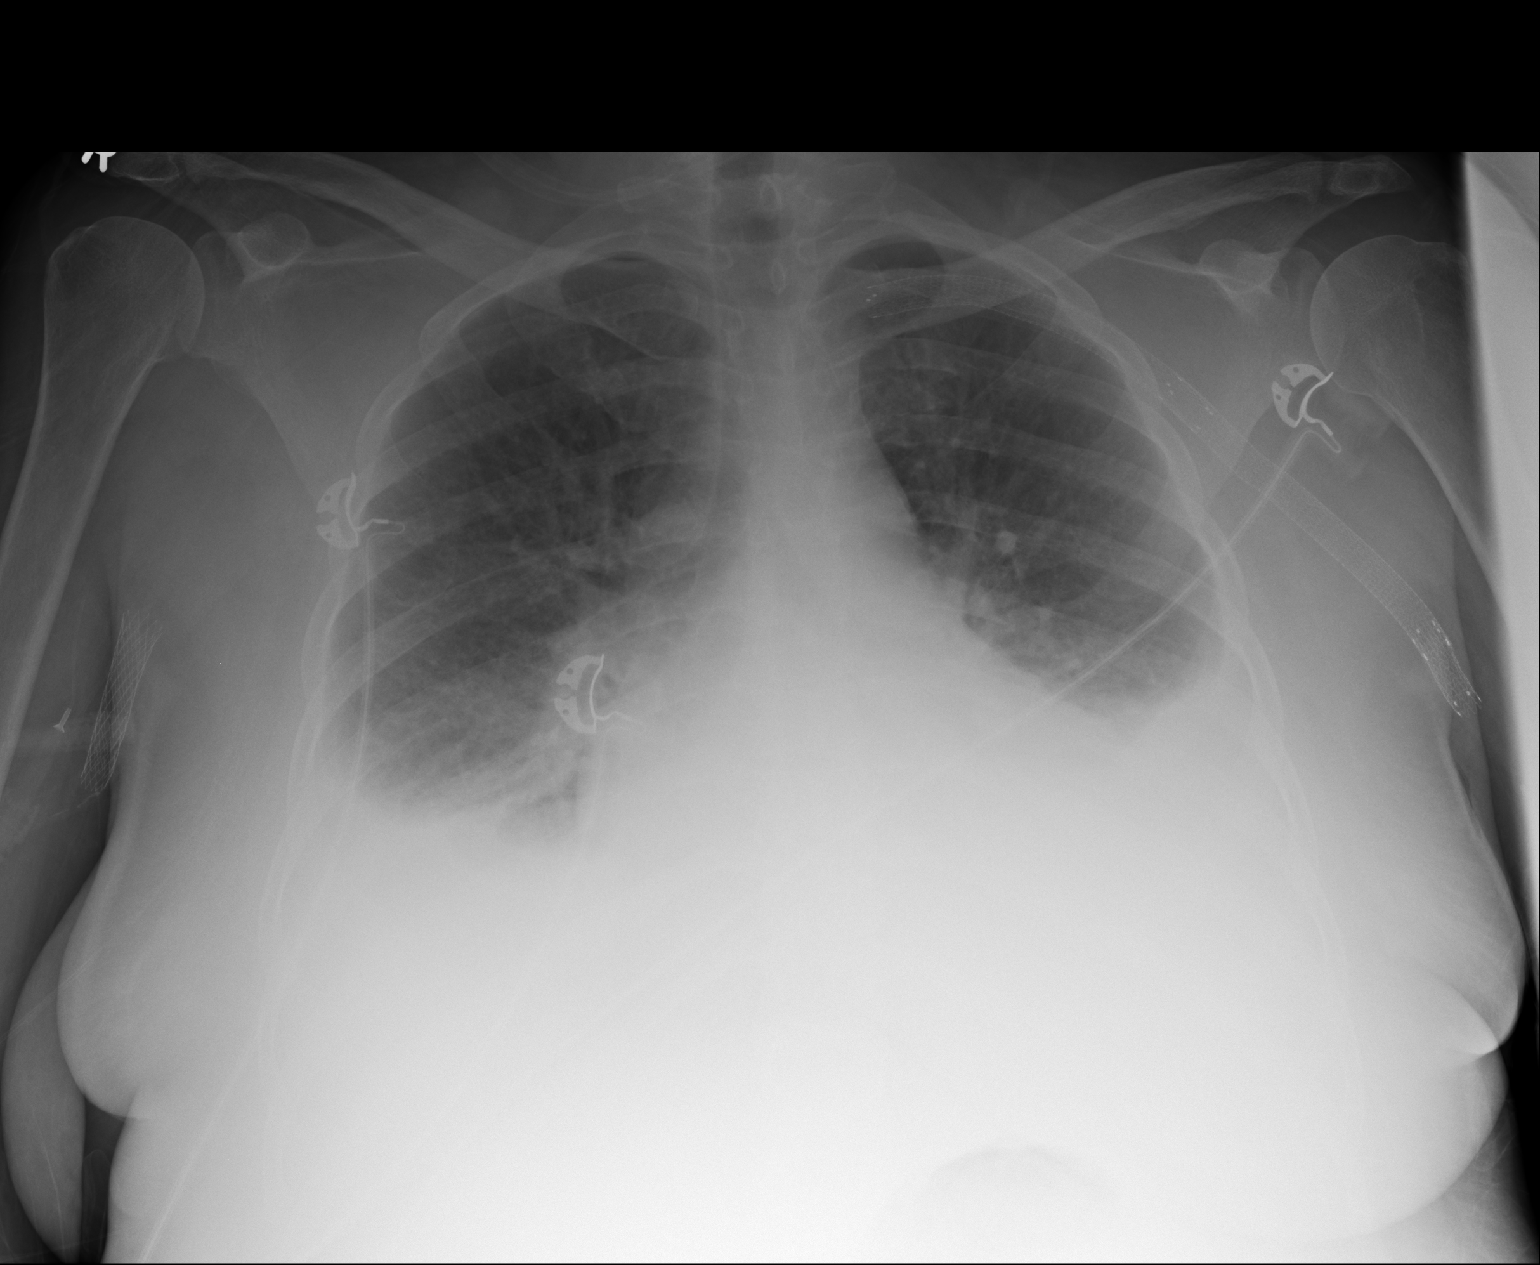

[1 of 1 positions shown; findings below may reference images not displayed]

FINDINGS: Cardiomegaly is identified.

Small-moderate bilateral pleural effusions and bibasilar atelectasis
noted.

Pulmonary vascular congestion is present.

There is no evidence of pneumothorax.

Vascular stents overlying the left subclavian axillary region and
right arm noted.
IMPRESSION: Cardiomegaly with pulmonary vascular congestion, small -moderate
bilateral pleural effusions and bibasilar atelectasis.

## 2015-08-16 IMAGING — CR DG ABDOMEN 2V
3 series · 3 of 3 positions shown · non-contrast
Comparison: March 06, 2015

CLINICAL DATA: Right upper quadrant region abdominal pain with
nausea and vomiting for 1 day. Chronic renal failure.

EXAM:
ABDOMEN - 2 VIEW

[abdomen erect]
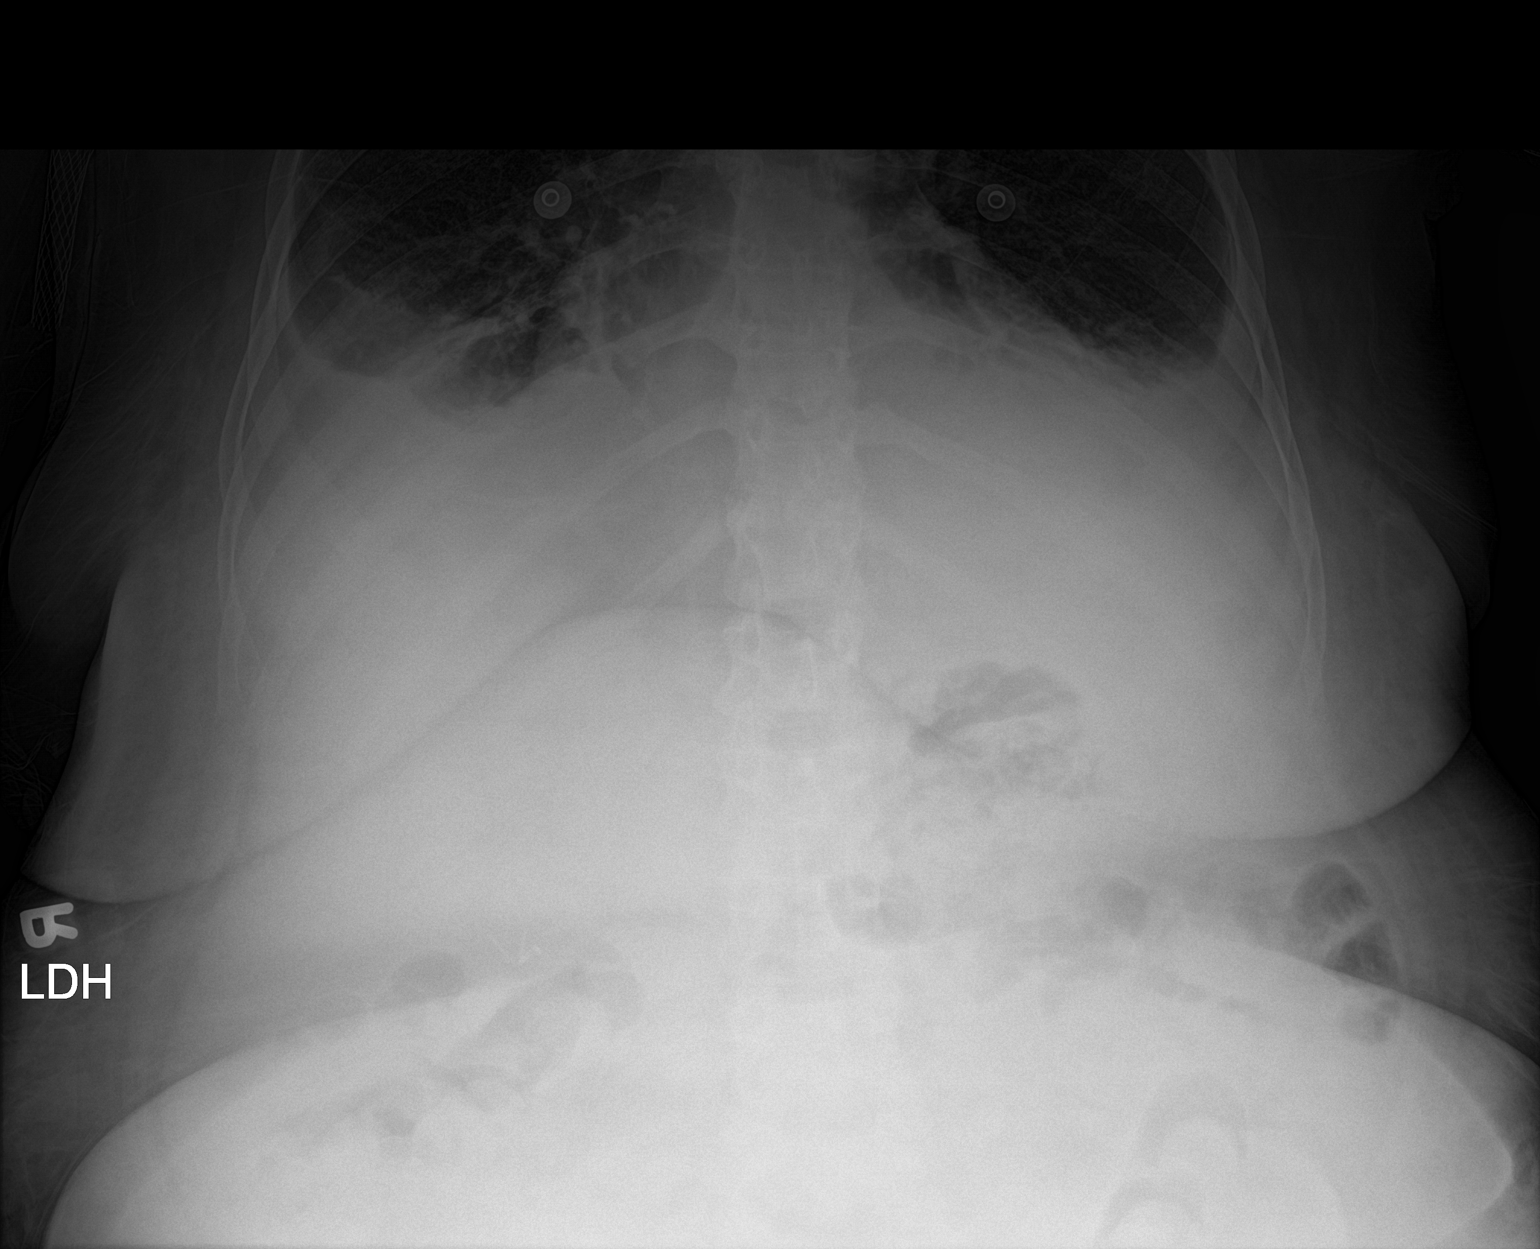

[abdomen supine (1 of 2)]
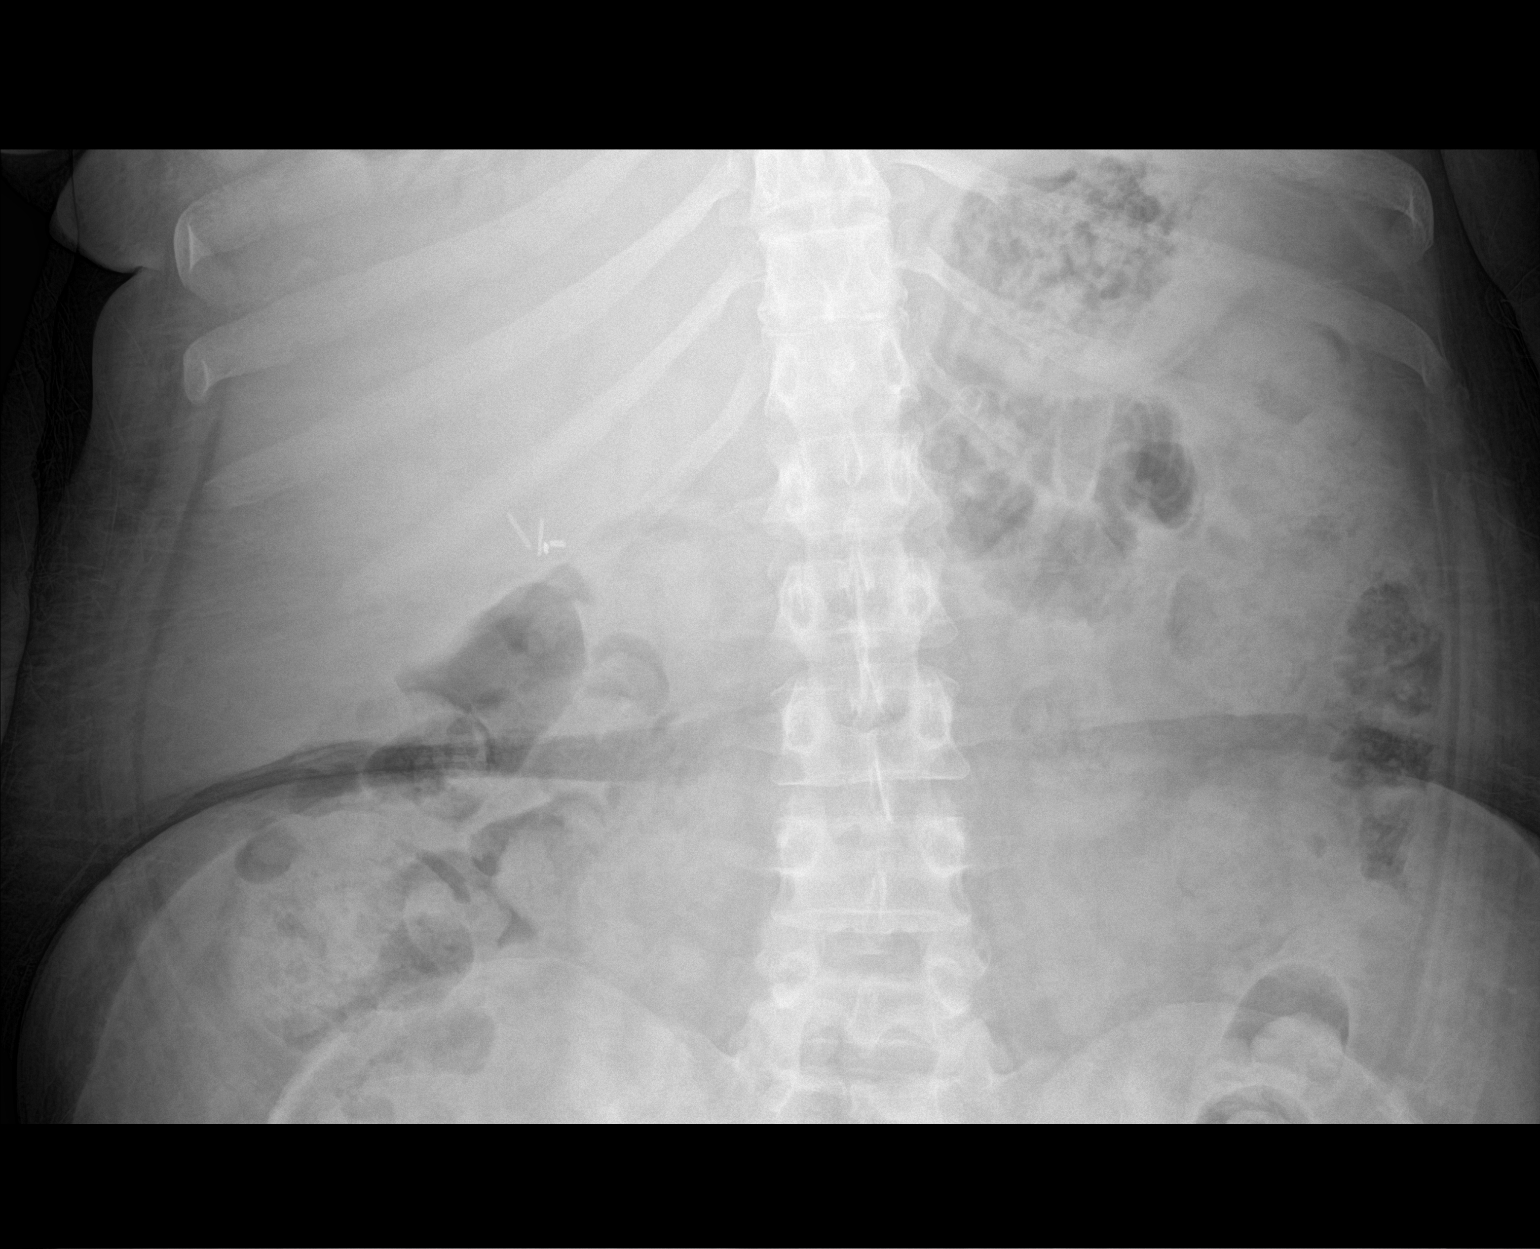

[abdomen supine (2 of 2)]
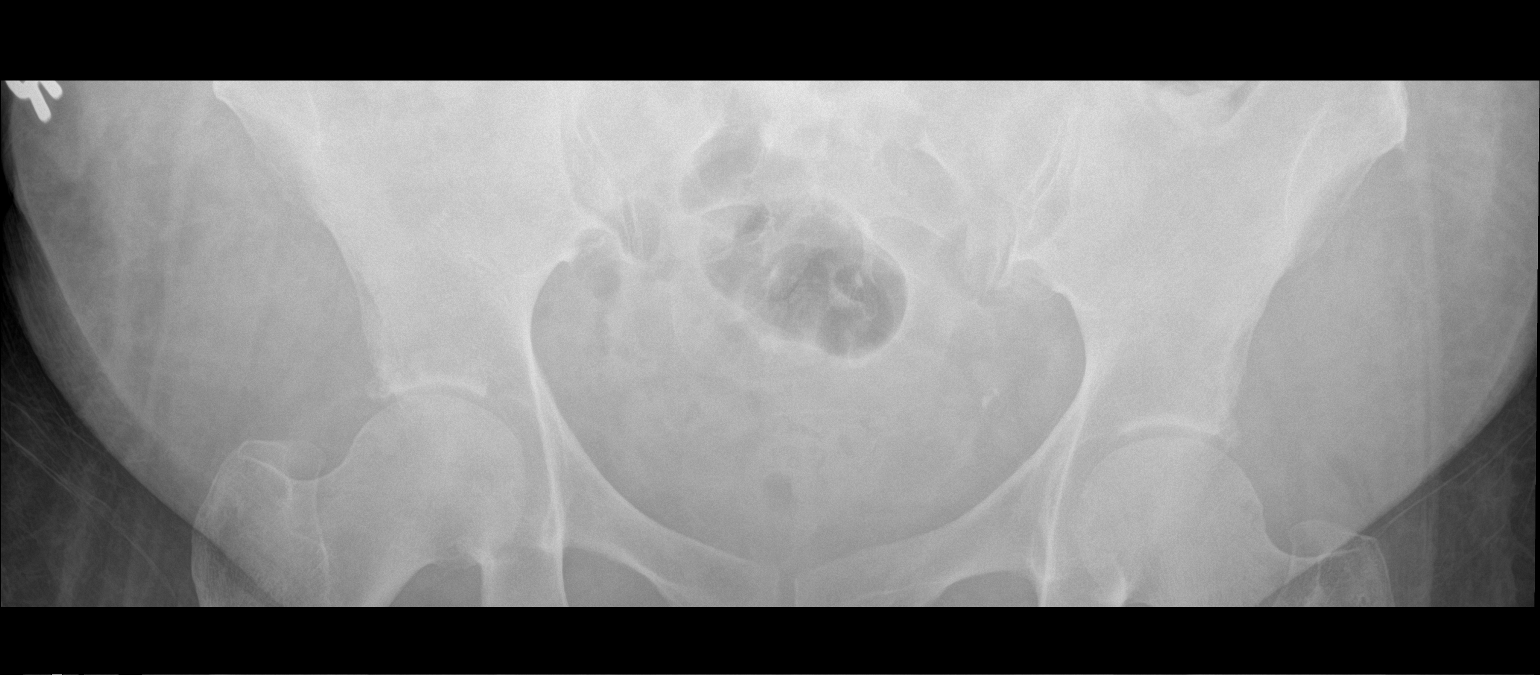

[3 of 3 positions shown; findings below may reference images not displayed]

FINDINGS: Supine and upright images obtained. There is fairly diffuse stool
throughout the colon. There is no bowel dilatation or air-fluid
level suggesting obstruction. No free air. There are surgical clips
in the right upper quadrant of the abdomen. There are small
phleboliths in the left pelvis. There are pleural effusions and
bibasilar atelectasis in the lung base regions.
IMPRESSION: Fairly diffuse stool throughout colon. Overall bowel gas pattern
unremarkable without obstruction or free air. Small pleural
effusions with bibasilar atelectasis.

## 2015-08-22 IMAGING — CR DG THORACIC SPINE 2V
1 series · 3 of 3 positions shown · non-contrast
Comparison: None.

CLINICAL DATA: Ground level fall this evening

EXAM:
THORACIC SPINE - 2-3 VIEWS

[Series 1: dg thoracic spine 2 view · 0.14mm/px · 3 of 3 slices shown]
[im 1/3]
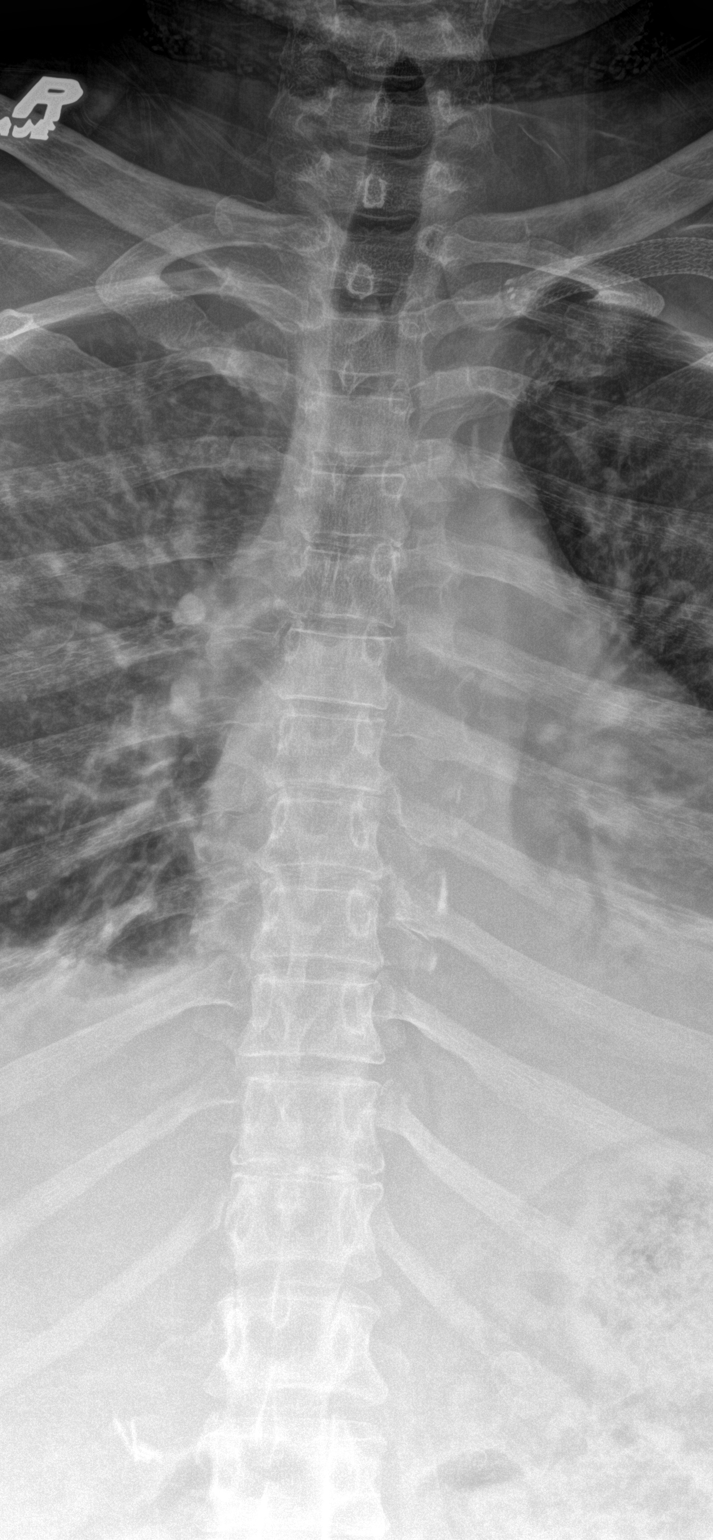
[im 2/3]
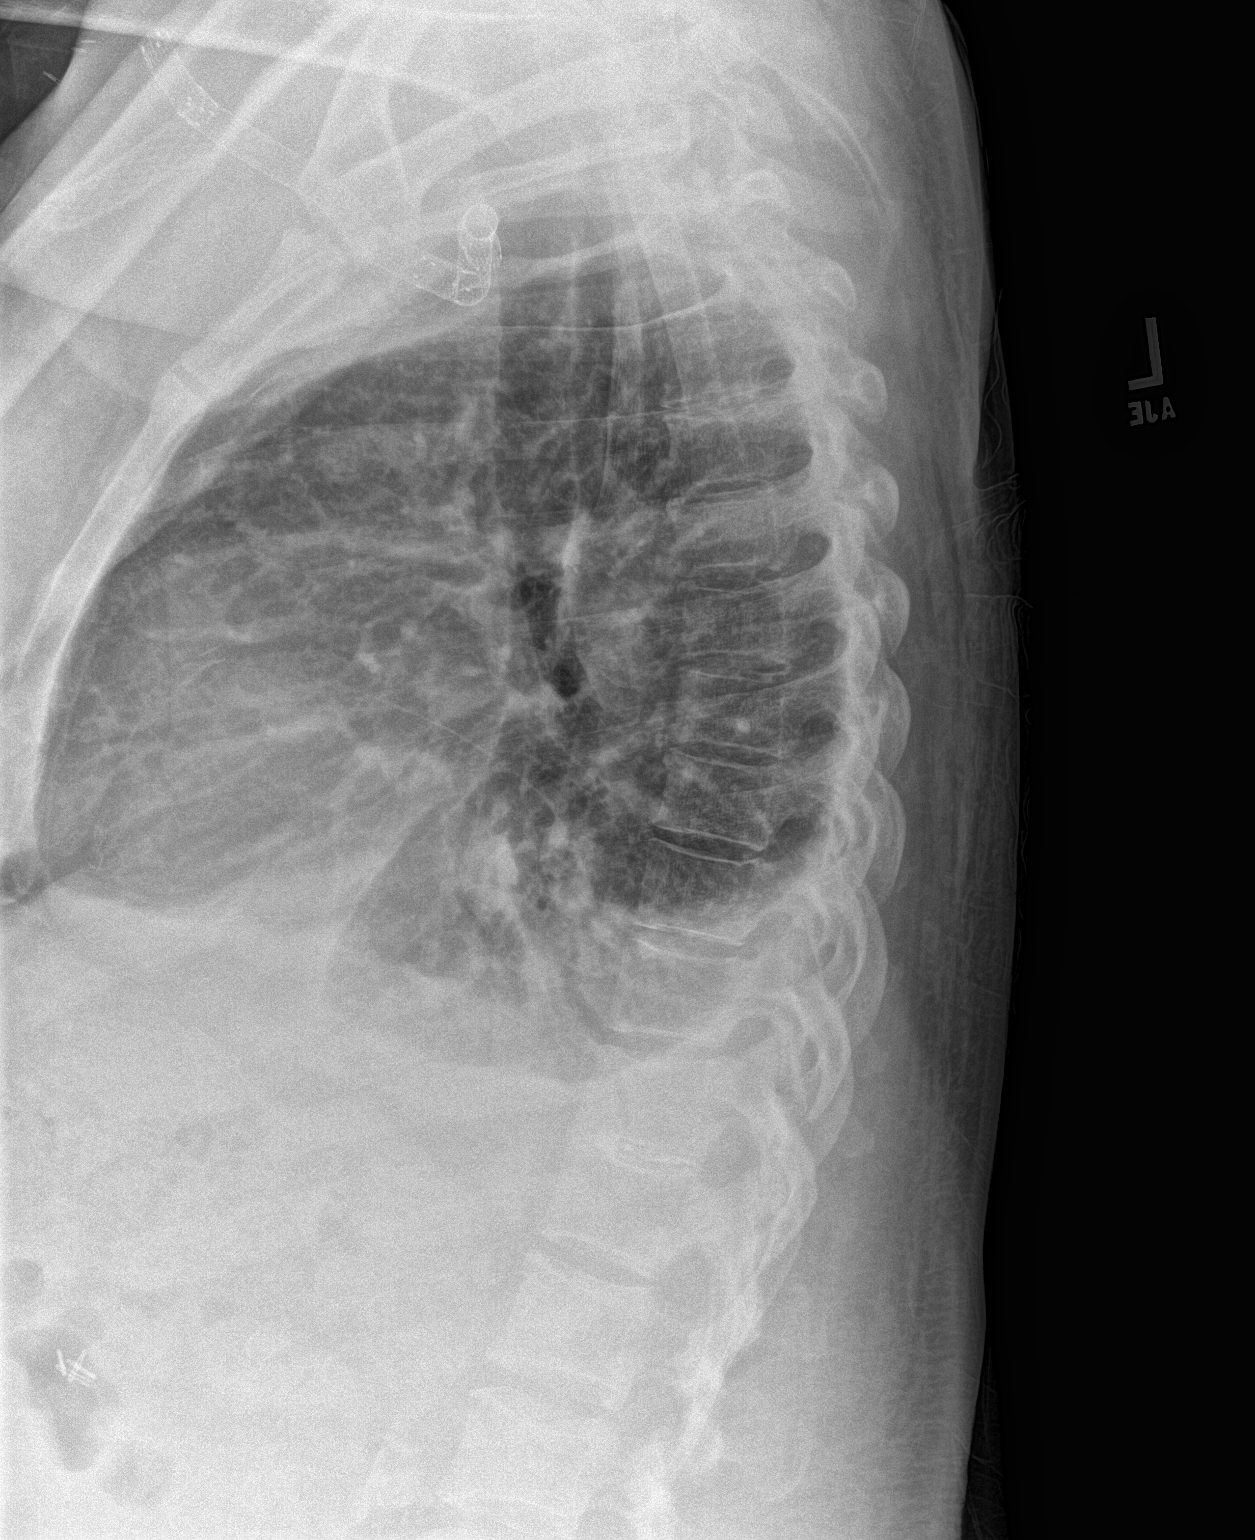
[im 3/3]
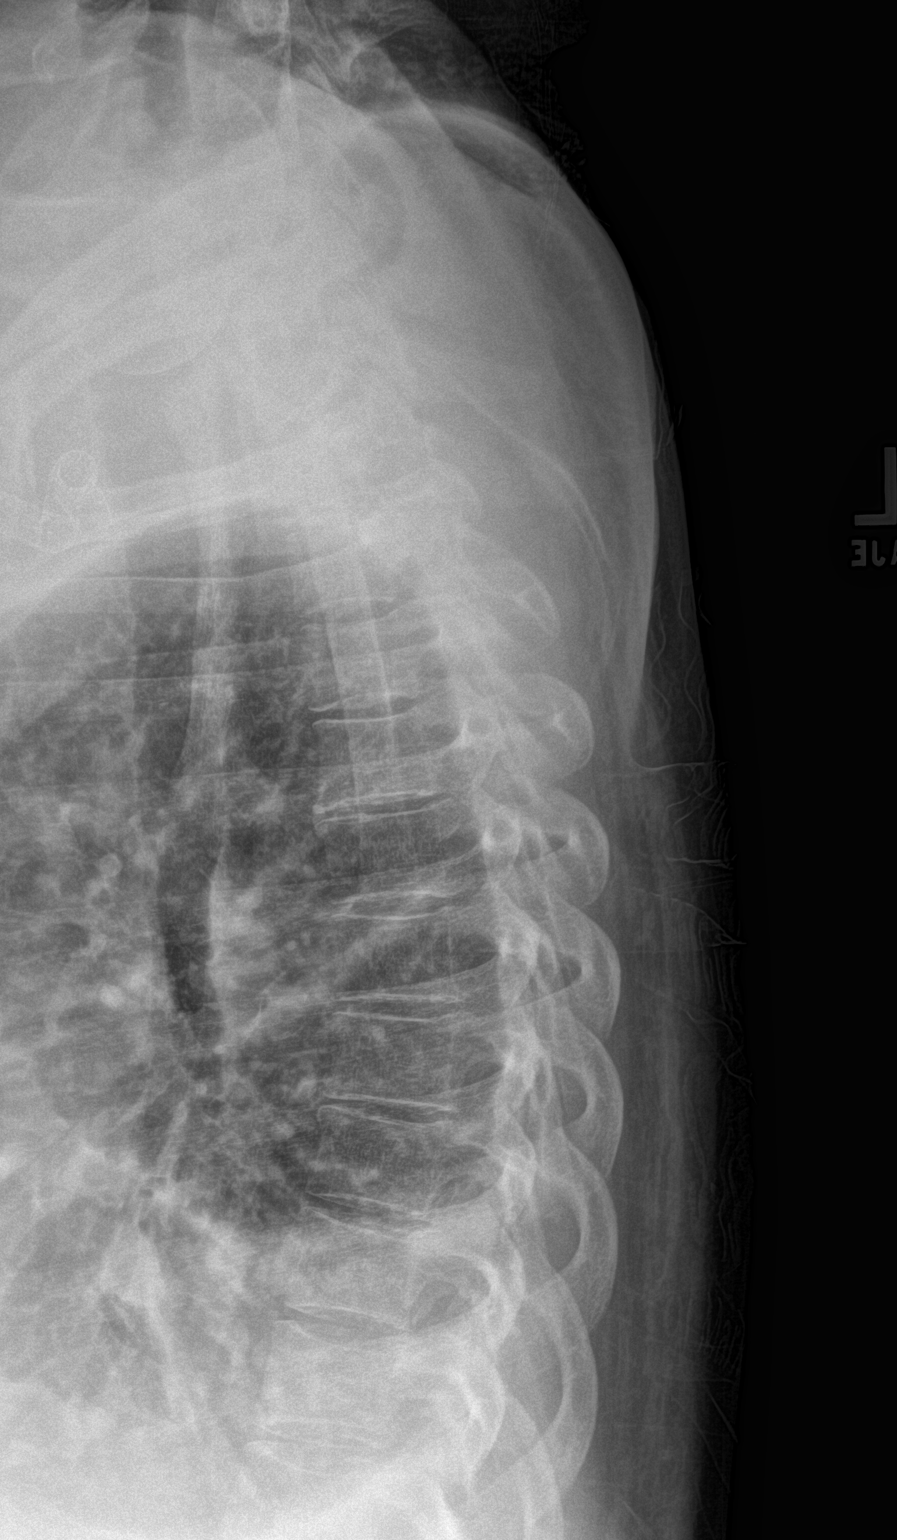

[3 of 3 positions shown; findings below may reference images not displayed]

FINDINGS: There is no evidence of thoracic spine fracture. Alignment is
normal. No other significant bone abnormalities are identified.
IMPRESSION: Negative.

## 2015-08-22 IMAGING — CT CT HEAD W/O CM
2 series · 14 of 30 positions shown, 16 images · non-contrast
Comparison: 03/14/2015

CLINICAL DATA: Fell today, striking occipital region. Now with
right frontal headache.

EXAM:
CT HEAD WITHOUT CONTRAST
TECHNIQUE: Contiguous axial images were obtained from the base of the skull
through the vertex without intravenous contrast.

[Series 2: head wo · axial · 0.45mm/px · z∈[+614,+713]mm · 6 of 32 slices shown, 8 images]
[im 5/32  brain]
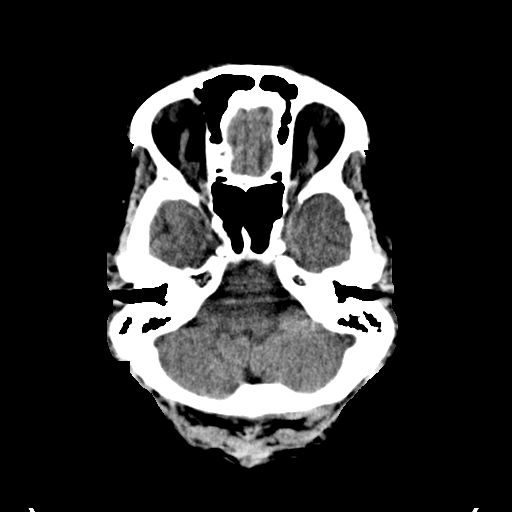
[im 5/32  bone]
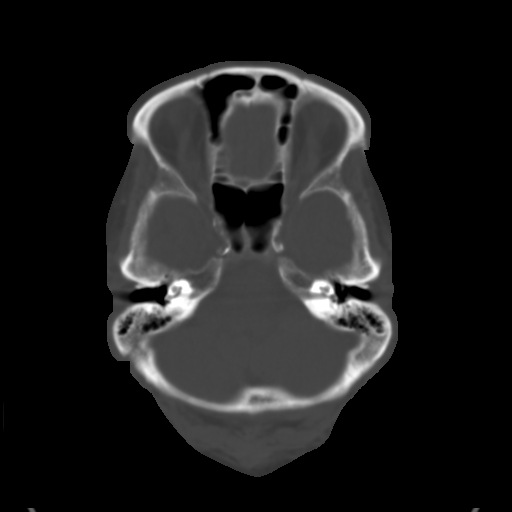
[im 9/32  brain]
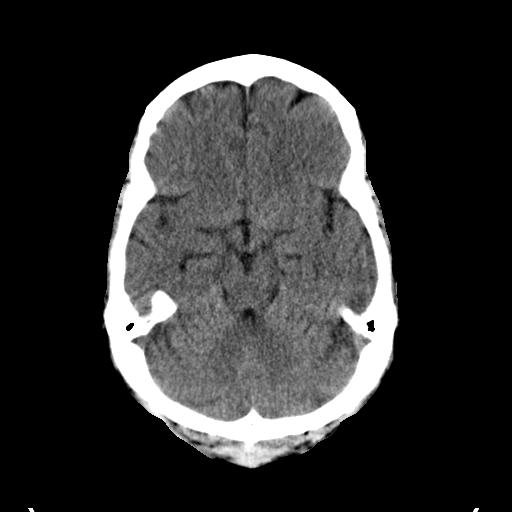
[im 14/32  brain]
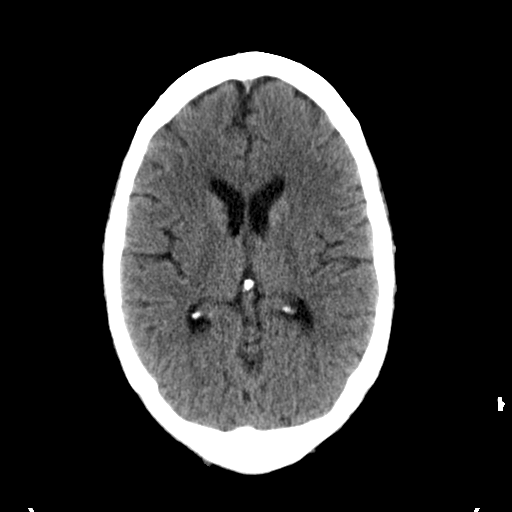
[im 18/32  brain]
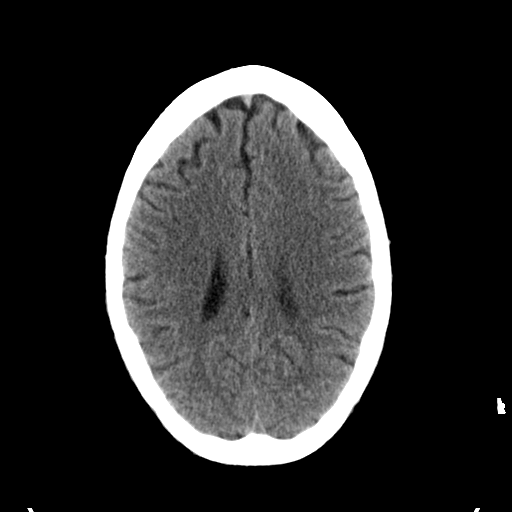
[im 23/32  brain]
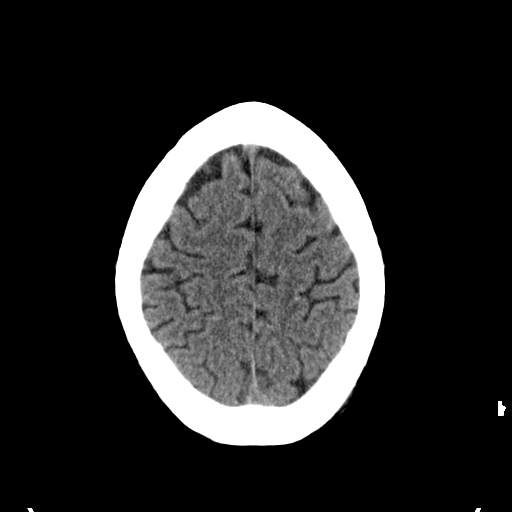
[im 23/32  bone]
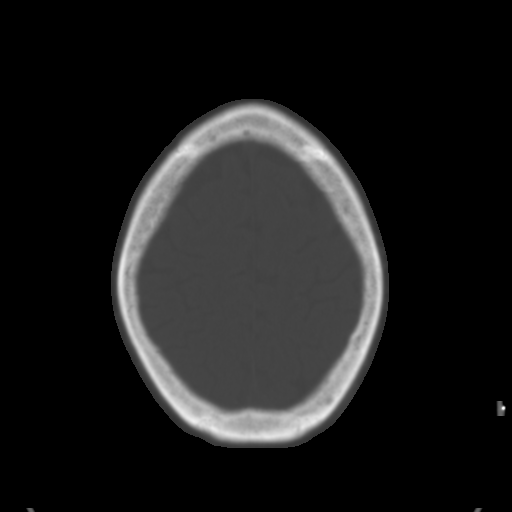
[im 27/32  brain]
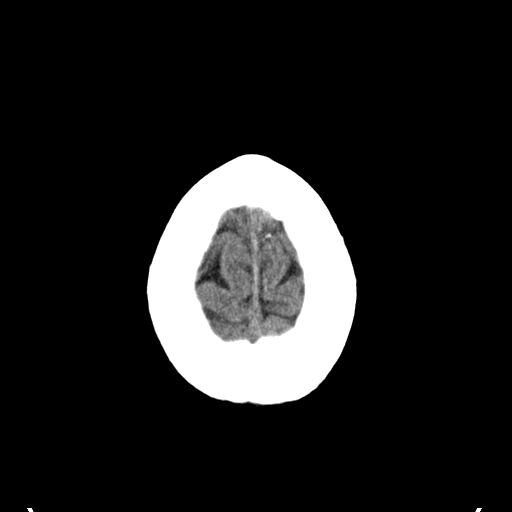

[Series 3: head bone · axial · 0.45mm/px · z∈[+608,+722]mm · 8 of 96 slices shown]
[im 10/96  bone]
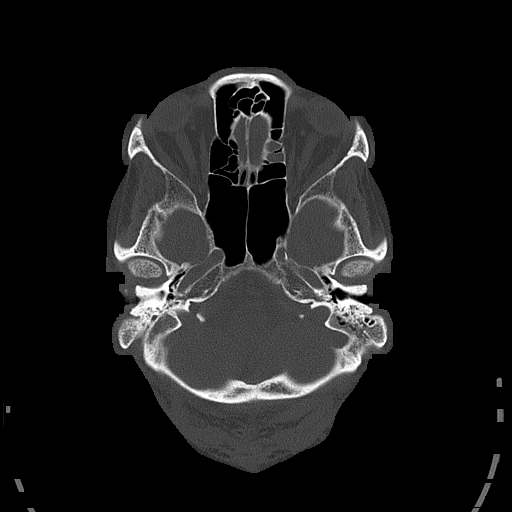
[im 19/96  bone]
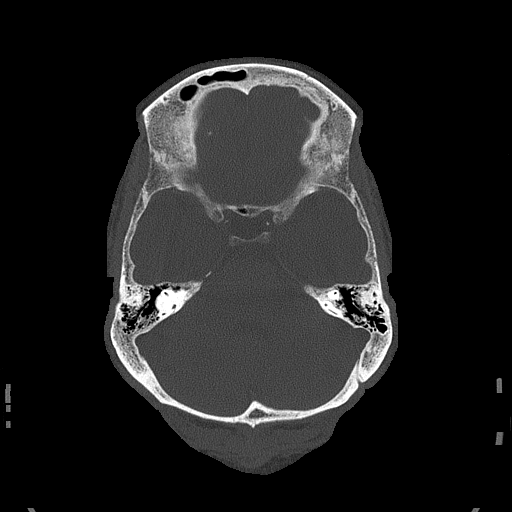
[im 32/96  bone]
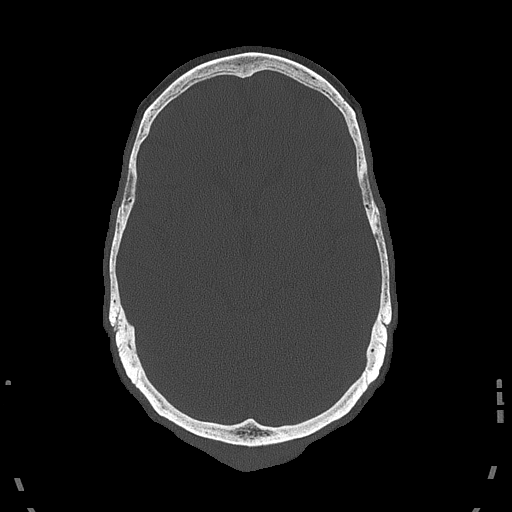
[im 41/96  bone]
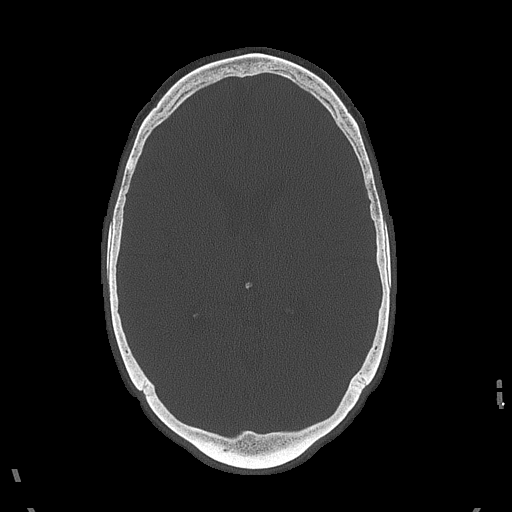
[im 55/96  bone]
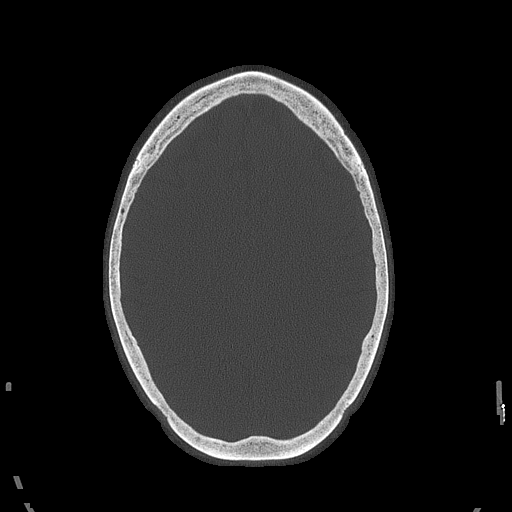
[im 64/96  bone]
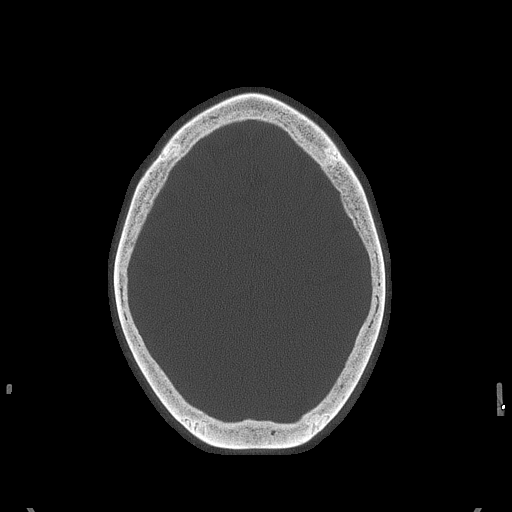
[im 77/96  bone]
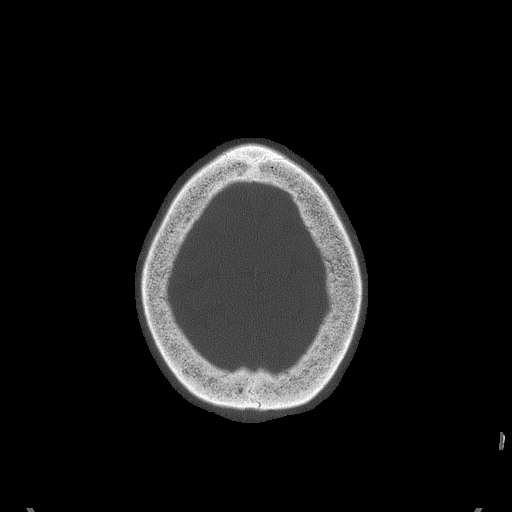
[im 86/96  bone]
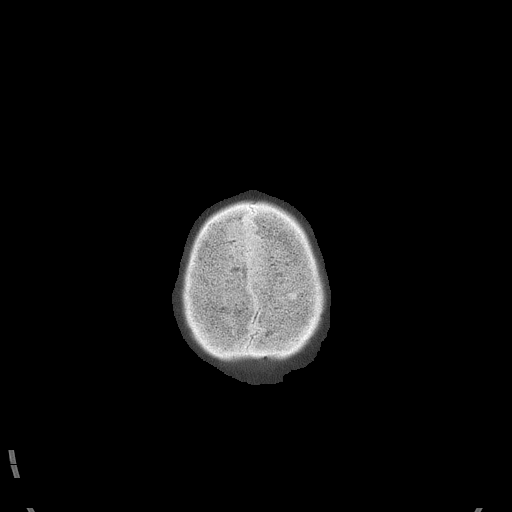

[14 of 30 positions shown; findings below may reference images not displayed]

FINDINGS: There is no intracranial hemorrhage, mass or evidence of acute
infarction. There is no extra-axial fluid collection. Gray matter
and white matter appear normal. Cerebral volume is normal for age.
Brainstem and posterior fossa are unremarkable. The CSF spaces
appear normal.

The bony structures are intact. The visible portions of the
paranasal sinuses are clear.
IMPRESSION: No acute intracranial findings.  Normal brain.

## 2015-08-23 ENCOUNTER — Ambulatory Visit: Payer: Medicare Other | Admitting: Anesthesiology

## 2015-08-23 ENCOUNTER — Encounter
Admission: RE | Disposition: A | Discharge status: Home / Self Care | Payer: Self-pay | Source: Ambulatory Visit | Attending: Unknown Physician Specialty

## 2015-08-23 ENCOUNTER — Ambulatory Visit
Admission: RE | Admit: 2015-08-23 | Discharge: 2015-08-23 | Disposition: A | Discharge status: Home / Self Care | Payer: Medicare Other | Source: Ambulatory Visit | Attending: Unknown Physician Specialty | Admitting: Unknown Physician Specialty

## 2015-08-23 DIAGNOSIS — G8929 Other chronic pain: Secondary | ICD-10-CM | POA: Diagnosis not present

## 2015-08-23 DIAGNOSIS — F329 Major depressive disorder, single episode, unspecified: Secondary | ICD-10-CM | POA: Diagnosis not present

## 2015-08-23 DIAGNOSIS — F17210 Nicotine dependence, cigarettes, uncomplicated: Secondary | ICD-10-CM | POA: Insufficient documentation

## 2015-08-23 DIAGNOSIS — J45909 Unspecified asthma, uncomplicated: Secondary | ICD-10-CM | POA: Diagnosis not present

## 2015-08-23 DIAGNOSIS — Z885 Allergy status to narcotic agent status: Secondary | ICD-10-CM | POA: Insufficient documentation

## 2015-08-23 DIAGNOSIS — Z7982 Long term (current) use of aspirin: Secondary | ICD-10-CM | POA: Diagnosis not present

## 2015-08-23 DIAGNOSIS — J449 Chronic obstructive pulmonary disease, unspecified: Secondary | ICD-10-CM | POA: Insufficient documentation

## 2015-08-23 DIAGNOSIS — I130 Hypertensive heart and chronic kidney disease with heart failure and stage 1 through stage 4 chronic kidney disease, or unspecified chronic kidney disease: Secondary | ICD-10-CM | POA: Diagnosis not present

## 2015-08-23 DIAGNOSIS — D124 Benign neoplasm of descending colon: Secondary | ICD-10-CM | POA: Insufficient documentation

## 2015-08-23 DIAGNOSIS — Z9109 Other allergy status, other than to drugs and biological substances: Secondary | ICD-10-CM | POA: Insufficient documentation

## 2015-08-23 DIAGNOSIS — M1990 Unspecified osteoarthritis, unspecified site: Secondary | ICD-10-CM | POA: Diagnosis not present

## 2015-08-23 DIAGNOSIS — Z992 Dependence on renal dialysis: Secondary | ICD-10-CM | POA: Insufficient documentation

## 2015-08-23 DIAGNOSIS — K640 First degree hemorrhoids: Secondary | ICD-10-CM | POA: Diagnosis not present

## 2015-08-23 DIAGNOSIS — K5730 Diverticulosis of large intestine without perforation or abscess without bleeding: Secondary | ICD-10-CM | POA: Insufficient documentation

## 2015-08-23 DIAGNOSIS — Z955 Presence of coronary angioplasty implant and graft: Secondary | ICD-10-CM | POA: Diagnosis not present

## 2015-08-23 DIAGNOSIS — I252 Old myocardial infarction: Secondary | ICD-10-CM | POA: Diagnosis not present

## 2015-08-23 DIAGNOSIS — N186 End stage renal disease: Secondary | ICD-10-CM | POA: Insufficient documentation

## 2015-08-23 DIAGNOSIS — K219 Gastro-esophageal reflux disease without esophagitis: Secondary | ICD-10-CM | POA: Diagnosis not present

## 2015-08-23 DIAGNOSIS — I5032 Chronic diastolic (congestive) heart failure: Secondary | ICD-10-CM | POA: Diagnosis not present

## 2015-08-23 DIAGNOSIS — I2510 Atherosclerotic heart disease of native coronary artery without angina pectoris: Secondary | ICD-10-CM | POA: Diagnosis not present

## 2015-08-23 DIAGNOSIS — Z79899 Other long term (current) drug therapy: Secondary | ICD-10-CM | POA: Insufficient documentation

## 2015-08-23 DIAGNOSIS — Z7984 Long term (current) use of oral hypoglycemic drugs: Secondary | ICD-10-CM | POA: Diagnosis not present

## 2015-08-23 DIAGNOSIS — E1122 Type 2 diabetes mellitus with diabetic chronic kidney disease: Secondary | ICD-10-CM | POA: Diagnosis not present

## 2015-08-23 DIAGNOSIS — A047 Enterocolitis due to Clostridium difficile: Secondary | ICD-10-CM | POA: Insufficient documentation

## 2015-08-23 DIAGNOSIS — F419 Anxiety disorder, unspecified: Secondary | ICD-10-CM | POA: Insufficient documentation

## 2015-08-23 DIAGNOSIS — E1140 Type 2 diabetes mellitus with diabetic neuropathy, unspecified: Secondary | ICD-10-CM | POA: Insufficient documentation

## 2015-08-23 HISTORY — DX: Headache: R51

## 2015-08-23 HISTORY — DX: Headache, unspecified: R51.9

## 2015-08-23 HISTORY — PX: FECAL TRANSPLANT: SHX6383

## 2015-08-23 HISTORY — DX: Depression, unspecified: F32.A

## 2015-08-23 HISTORY — DX: Anemia, unspecified: D64.9

## 2015-08-23 HISTORY — DX: Unspecified osteoarthritis, unspecified site: M19.90

## 2015-08-23 HISTORY — DX: Major depressive disorder, single episode, unspecified: F32.9

## 2015-08-23 HISTORY — DX: Anxiety disorder, unspecified: F41.9

## 2015-08-23 SURGERY — FECAL MICROBIOTA TRANSFER
Anesthesia: General

## 2015-08-23 MED ORDER — SODIUM CHLORIDE 0.9 % IV SOLN: INTRAVENOUS | Status: DC

## 2015-08-23 MED ORDER — SODIUM CHLORIDE 0.9 % IV SOLN
INTRAVENOUS | Status: DC
Start: 2015-08-23 — End: 2015-08-23

## 2015-08-23 MED ORDER — FENTANYL CITRATE (PF) 100 MCG/2ML IJ SOLN
INTRAMUSCULAR | Status: DC | PRN
  Administered 2015-08-23 (×2): 25 ug via INTRAVENOUS

## 2015-08-23 MED ORDER — PROPOFOL 10 MG/ML IV BOLUS
INTRAVENOUS | Status: DC | PRN
  Administered 2015-08-23: 30 mg via INTRAVENOUS
  Administered 2015-08-23: 10 mg via INTRAVENOUS

## 2015-08-23 MED ORDER — LIDOCAINE HCL (PF) 2 % IJ SOLN
INTRAMUSCULAR | Status: DC | PRN
  Administered 2015-08-23: 40 mg

## 2015-08-23 MED ORDER — PROPOFOL 500 MG/50ML IV EMUL
INTRAVENOUS | Status: DC | PRN
  Administered 2015-08-23: 50 ug/kg/min via INTRAVENOUS

## 2015-08-23 MED ORDER — SODIUM CHLORIDE 0.9 % IV SOLN
INTRAVENOUS | Status: DC
Start: 2015-08-23 — End: 2015-08-23
  Administered 2015-08-23: 1000 mL via INTRAVENOUS

## 2015-08-23 NOTE — Op Note (Signed)
[facility] Gastroenterology Patient Name: [name] Procedure Date: 08/23/2015 11:47 AM MRN: [id number] Account #: [id number] Date of Birth: Mar 29, 1961 Admit Type: Outpatient Age: 54 Room: [facility] ENDO ROOM 1 Gender: Female Note Status: Finalized Procedure:         Colonoscopy Indications:       Fecal transplant for treatment of Clostridium difficile                     diarrhea Providers:         Manya Silvas, MD Referring MD:      Ellin Goodie, MD (Referring MD) Medicines:         Propofol per Anesthesia Complications:     No immediate complications. Procedure:         Pre-Anesthesia Assessment:                    - After reviewing the risks and benefits, the patient was                     deemed in satisfactory condition to undergo the procedure.                    After obtaining informed consent, the colonoscope was                     passed under direct vision. Throughout the procedure, the                     patient's blood pressure, pulse, and oxygen saturations                     were monitored continuously. The Colonoscope was                     introduced through the anus and advanced to the the cecum,                     identified by appendiceal orifice and ileocecal valve. The                     colonoscopy was somewhat difficult. Successful completion                     of the procedure was aided by lavage. The patient                     tolerated the procedure well. The quality of the bowel                     preparation was adequate to identify polyps. Findings:      A small polyp was found in the descending colon. The polyp was sessile.       Fecal Microbiota Transplant (Bacteriotherapy): Donor stool was prepared       by me using milk as per protocol. Approximately 400 mL of the emulsified       donor stool was instilled in the ascending colon and in the cecum. A       detailed colonoscopic exam could not be performed upon scope  withdrawal       secondary to limited visibility from the instilled stool.      Multiple medium-mouthed diverticula were found in the sigmoid colon, in       the descending colon and in the  transverse colon.      Internal hemorrhoids were found during endoscopy. The hemorrhoids were       small and Grade I (internal hemorrhoids that do not prolapse).      The exam was otherwise without abnormality. Impression:        - One small polyp in the descending colon.                    - Diverticulosis in the sigmoid colon, in the descending                     colon and in the transverse colon.                    - Internal hemorrhoids.                    - The examination was otherwise normal.                    - Fecal Microbiota Transplant (Bacteriotherapy) performed                     in the ascending colon and in the cecum.                    - No specimens collected. Recommendation:    - The findings and recommendations were discussed with the                     patient's family. To remain in post op for 3-4 hours. To                     spread stool around. Manya Silvas, MD 08/23/2015 12:17:03 PM This report has been signed electronically. Number of Addenda: 0 Note Initiated On: 08/23/2015 11:47 AM Scope Withdrawal Time: 0 hours 6 minutes 20 seconds  Total Procedure Duration: 0 hours 19 minutes 49 seconds       [facility]

## 2015-08-23 NOTE — Anesthesia Postprocedure Evaluation (Signed)
Anesthesia Post Note  Patient: [name]  Procedure(s) Performed: Procedure(s) (LRB): FECAL TRANSPLANT (N/A)  Patient location during evaluation: PACU Anesthesia Type: General Level of consciousness: awake Pain management: pain level controlled Vital Signs Assessment: post-procedure vital signs reviewed and stable Respiratory status: spontaneous breathing Cardiovascular status: stable Anesthetic complications: no    Last Vitals:  Filed Vitals:   08/23/15 1038 08/23/15 1215  BP: 150/52 116/44  Pulse: 59 51  Temp: 36.6 C 35.9 C  Resp: 16 12    Last Pain: There were no vitals filed for this visit.               VAN STAVEREN,Myriam Brandhorst

## 2015-08-23 NOTE — H&P (Signed)
Primary Care Physician:  Ellamae Sia, MD Primary Gastroenterologist:  Dr. Vira Agar c Pre-Procedure History & Physical: HPI:  [name] is a 54 y.o. female is here for an colonoscopy.   Past Medical History  Diagnosis Date  . Asthma   . Collagen vascular disease ([place])   . Diabetes mellitus without complication ([place])   . Hypertension   . Coronary artery disease     a. cath 2013: stenting to RCA (report not available); b. cath 2014: LM nl, pLAD 40%, mLAD nl, ost LCx 40%, mid LCx nl, pRCA 30% @ site of prior stent, mRCA 50%  . Diabetic neuropathy ([employer])   . ESRD (end stage renal disease) on dialysis ([place])     M-W-F  . GERD (gastroesophageal reflux disease)   . Hx of pancreatitis 2015  . Myocardial infarction ([place])   . Pneumonia   . Mitral regurgitation     a. echo 10/2013: EF 62%, noWMA, mildly dilated LA, mild to mod MR/TR, GR1DD  . COPD (chronic obstructive pulmonary disease) ([place])   . chronic diastolic CHF [id number]  . dialysis 2006  . Depression   . Anxiety   . Arthritis   . Headache   . Anemia     Past Surgical History  Procedure Laterality Date  . Cholecystectomy    . Appendectomy    . Abdominal hysterectomy    . Dialysis fistula creation Left     upper arm  . Esophagogastroduodenoscopy N/A 03/08/2015    Procedure: ESOPHAGOGASTRODUODENOSCOPY (EGD);  Surgeon: Manya Silvas, MD;  Location: [facility] ENDOSCOPY;  Service: Endoscopy;  Laterality: N/A;  . Cardiac catheterization Left 07/26/2015    Procedure: Left Heart Cath and Coronary Angiography;  Surgeon: Dionisio David, MD;  Location: [facility] CV LAB;  Service: Cardiovascular;  Laterality: Left;  [facility] surgery      Prior to Admission medications   Medication Sig Start Date End Date Taking? Authorizing Provider  acidophilus (RISAQUAD) CAPS capsule Take 1 capsule by mouth daily. 07/25/15   Bettey Costa, MD  albuterol (PROVENTIL HFA;VENTOLIN HFA) 108 (90 BASE) MCG/ACT inhaler Inhale 4-6 puffs by mouth every 4  hours as needed for wheezing, cough, and/or shortness of breath Patient taking differently: Inhale 4-6 puffs into the lungs every 4 (four) hours as needed for wheezing or shortness of breath (or cough).  02/17/15   Hinda Kehr, MD  albuterol (PROVENTIL) (2.5 MG/3ML) 0.083% nebulizer solution Take 2.5 mg by nebulization every 4 (four) hours as needed for wheezing or shortness of breath.    Historical Provider, MD  amiodarone (PACERONE) 400 MG tablet Take 1 tablet (400 mg total) by mouth daily. 07/26/15   Bettey Costa, MD  amLODipine (NORVASC) 10 MG tablet Take 10 mg by mouth daily. *Take after dialysis on dialysis day*    Historical Provider, MD  aspirin EC 81 MG tablet Take 81 mg by mouth daily.    Historical Provider, MD  atorvastatin (LIPITOR) 20 MG tablet Take 20 mg by mouth every evening.    Historical Provider, MD  B Complex-C-Biotin-E-FA (RENATABS PO) Take 1 tablet by mouth daily.    Historical Provider, MD  carvedilol (COREG) 25 MG tablet Take 25 mg by mouth 2 (two) times daily.    Historical Provider, MD  cetirizine (ZYRTEC) 10 MG tablet Take 10 mg by mouth daily.    Historical Provider, MD  citalopram (CELEXA) 20 MG tablet Take 20 mg by mouth daily.    Historical Provider, MD  clopidogrel (PLAVIX)  75 MG tablet Take 75 mg by mouth daily.    Historical Provider, MD  Dextrose, Diabetic Use, (DEX4 GLUCOSE) 1 G CHEW Chew 3-4 g by mouth See admin instructions. Chew and swallow 3-4 tablets (3-4 grams) orally as needed if sugar check is less than 60.    Historical Provider, MD  docusate sodium (COLACE) 100 MG capsule Take 1 capsule (100 mg total) by mouth 2 (two) times daily. 06/07/15   Srikar Sudini, MD  guaiFENesin-dextromethorphan (ROBITUSSIN DM) 100-10 MG/5ML syrup Take 10 mLs by mouth every 6 (six) hours as needed for cough. 05/24/15   Gladstone Lighter, MD  HYDROcodone-acetaminophen (NORCO/VICODIN) 5-325 MG per tablet Take 1 tablet by mouth 3 (three) times daily as needed for severe pain (for severe  chronic pain.).     Historical Provider, MD  hydrocortisone cream 1 % Apply 1 application topically 2 (two) times daily.    Historical Provider, MD  lipase/protease/amylase (CREON) 12000 UNITS CPEP capsule Take 36,000 Units by mouth 3 (three) times daily with meals.     Historical Provider, MD  metoCLOPramide (REGLAN) 5 MG tablet Take 1 tablet (5 mg total) by mouth 4 (four) times daily -  before meals and at bedtime. 02/24/15   Srikar Sudini, MD  metroNIDAZOLE (FLAGYL) 500 MG tablet Take 1 tablet (500 mg total) by mouth every 8 (eight) hours. 07/25/15   Bettey Costa, MD  nitroGLYCERIN (NITROSTAT) 0.4 MG SL tablet Place 0.4 mg under the tongue every 5 (five) minutes x 3 doses as needed for chest pain. *Max 3 doses per episode*    Historical Provider, MD  ondansetron (ZOFRAN ODT) 4 MG disintegrating tablet Take 1 tablet (4 mg total) by mouth every 8 (eight) hours as needed for nausea or vomiting. 06/14/15   Earleen Newport, MD  pantoprazole (PROTONIX) 40 MG tablet Take 40 mg by mouth daily.    Historical Provider, MD  prochlorperazine (COMPAZINE) 5 MG tablet Take 1 tablet (5 mg total) by mouth every 6 (six) hours as needed for nausea or vomiting. 06/14/15 06/13/16  Earleen Newport, MD  promethazine (PHENERGAN) 12.5 MG tablet Take 12.5 mg by mouth every 6 (six) hours as needed for nausea or vomiting.    Historical Provider, MD  promethazine (PHENERGAN) 25 MG suppository Place 1 suppository (25 mg total) rectally every 8 (eight) hours as needed for nausea or vomiting. 06/07/15   [name], MD  ranitidine (ZANTAC) 150 MG tablet Take 150 mg by mouth daily.     Historical Provider, MD  ranolazine (RANEXA) 1000 MG SR tablet Take 1,000 mg by mouth 2 (two) times daily.    Historical Provider, MD  sevelamer carbonate (RENVELA) 800 MG tablet Take 800 mg by mouth 3 (three) times daily with meals.     Historical Provider, MD  sucralfate (CARAFATE) 1 G tablet Take 1 tablet (1 g total) by mouth 4 (four) times daily  -  with meals and at bedtime. 02/24/15   Srikar Sudini, MD  vitamin B-12 (CYANOCOBALAMIN) 1000 MCG tablet Take 1,000 mcg by mouth daily.    Historical Provider, MD  Vitamin D, Cholecalciferol, 400 UNITS TABS Take 400 Units by mouth daily.     Historical Provider, MD    Allergies as of 08/20/2015 - Review Complete 07/26/2015  Allergen Reaction Noted  . Compazine [prochlorperazine edisylate] Anaphylaxis and Nausea And Vomiting 02/19/2015  . Ace inhibitors Swelling 02/08/2015  . Ativan [lorazepam] Other (See Comments) 04/10/2015  . Codeine Nausea And Vomiting 02/06/2015  . Gabapentin Other (  See Comments) 05/21/2015  . Losartan Other (See Comments) 03/06/2015  . Scopolamine Other (See Comments) 05/21/2015  . Zofran [ondansetron hcl] Other (See Comments) 03/02/2015  . Oxycodone Anxiety 02/06/2015  . Tape Rash 02/08/2015    Family History  Problem Relation Age of Onset  . Kidney disease Mother   . Diabetes Mother     Social History   Social History  . Marital Status: Divorced    Spouse Name: N/A  . Number of Children: N/A  . Years of Education: N/A   Occupational History  . Not on file.   Social History Main Topics  . Smoking status: Current Some Day Smoker -- 0.50 packs/day    Types: Cigarettes    Last Attempt to Quit: 02/13/2015  . Smokeless tobacco: Never Used  . Alcohol Use: No  . Drug Use: No  . Sexual Activity: Not on file   Other Topics Concern  . Not on file   Social History Narrative    Review of Systems: See HPI, otherwise negative ROS  Physical Exam: BP 150/52 mmHg  Pulse 59  Temp(Src) 97.9 F (36.6 C) (Tympanic)  Resp 16  Ht 5' 5.5" (1.664 m)  Wt 78.019 kg (172 lb)  BMI 28.18 kg/m2  SpO2 100% General:   Alert,  pleasant and cooperative in NAD Head:  Normocephalic and atraumatic. Neck:  Supple; no masses or thyromegaly. Lungs:  Clear throughout to auscultation.    Heart:  Regular rate and rhythm. Abdomen:  Soft, nontender and nondistended.  Normal bowel sounds, without guarding, and without rebound.   Neurologic:  Alert and  oriented x4;  grossly normal neurologically.  Impression/Plan: [name] is here for an colonoscopy to be performed for stool transplant for recurrent C.dif colitis  Risks, benefits, limitations, and alternatives regarding  colonoscopy have been reviewed with the patient.  Questions have been answered.  All parties agreeable.   Gaylyn Cheers, MD  08/23/2015, 11:45 AM

## 2015-08-23 NOTE — Transfer of Care (Signed)
Immediate Anesthesia Transfer of Care Note  Patient: [name]  Procedure(s) Performed: Procedure(s): FECAL TRANSPLANT (N/A)  Patient Location: PACU  Anesthesia Type:General  Level of Consciousness: sedated  Airway & Oxygen Therapy: Patient Spontanous Breathing and Patient connected to nasal cannula oxygen  Post-op Assessment: Report given to RN and Post -op Vital signs reviewed and stable  Post vital signs: Reviewed and stable  Last Vitals:  Filed Vitals:   08/23/15 1245 08/23/15 1255  BP: 134/55 141/60  Pulse: 53 53  Temp:    Resp: 12 13    Complications: No apparent anesthesia complications

## 2015-08-23 NOTE — Anesthesia Preprocedure Evaluation (Signed)
Anesthesia Evaluation  Patient identified by MRN, date of birth, ID band Patient awake    Reviewed: Allergy & Precautions, NPO status , Patient's Chart, lab work & pertinent test results  Airway Mallampati: III       Dental  (+) Upper Dentures, Lower Dentures   Pulmonary COPD, Current Smoker,    + rhonchi        Cardiovascular hypertension, Pt. on medications + CAD, + Past MI and +CHF   Rhythm:Irregular     Neuro/Psych Anxiety Depression    GI/Hepatic Neg liver ROS, GERD  ,  Endo/Other  diabetes, Oral Hypoglycemic Agents  Renal/GU DialysisRenal disease     Musculoskeletal   Abdominal   Peds  Hematology  (+) anemia ,   Anesthesia Other Findings   Reproductive/Obstetrics                             Anesthesia Physical Anesthesia Plan  ASA: IV  Anesthesia Plan: General   Post-op Pain Management:    Induction: Intravenous  Airway Management Planned: Nasal Cannula  Additional Equipment:   Intra-op Plan:   Post-operative Plan:   Informed Consent: I have reviewed the patients History and Physical, chart, labs and discussed the procedure including the risks, benefits and alternatives for the proposed anesthesia with the patient or authorized representative who has indicated his/her understanding and acceptance.     Plan Discussed with: CRNA  Anesthesia Plan Comments:         Anesthesia Quick Evaluation

## 2015-08-27 ENCOUNTER — Encounter: Payer: Self-pay | Admitting: Unknown Physician Specialty

## 2015-08-30 IMAGING — CR DG CHEST 1V PORT
1 series · 2 of 2 positions shown · non-contrast
Comparison: Chest radiograph April 01, 2015

CLINICAL DATA: Hypoglycemia, unresponsiveness. Endotracheal tube
placement. History of diabetes and dialysis, hypertension, asthma,
CHF, COPD.

EXAM:
PORTABLE CHEST - 1 VIEW

[Series 1: ap · 0.17mm/px · 2 of 2 slices shown]
[im 1/2]
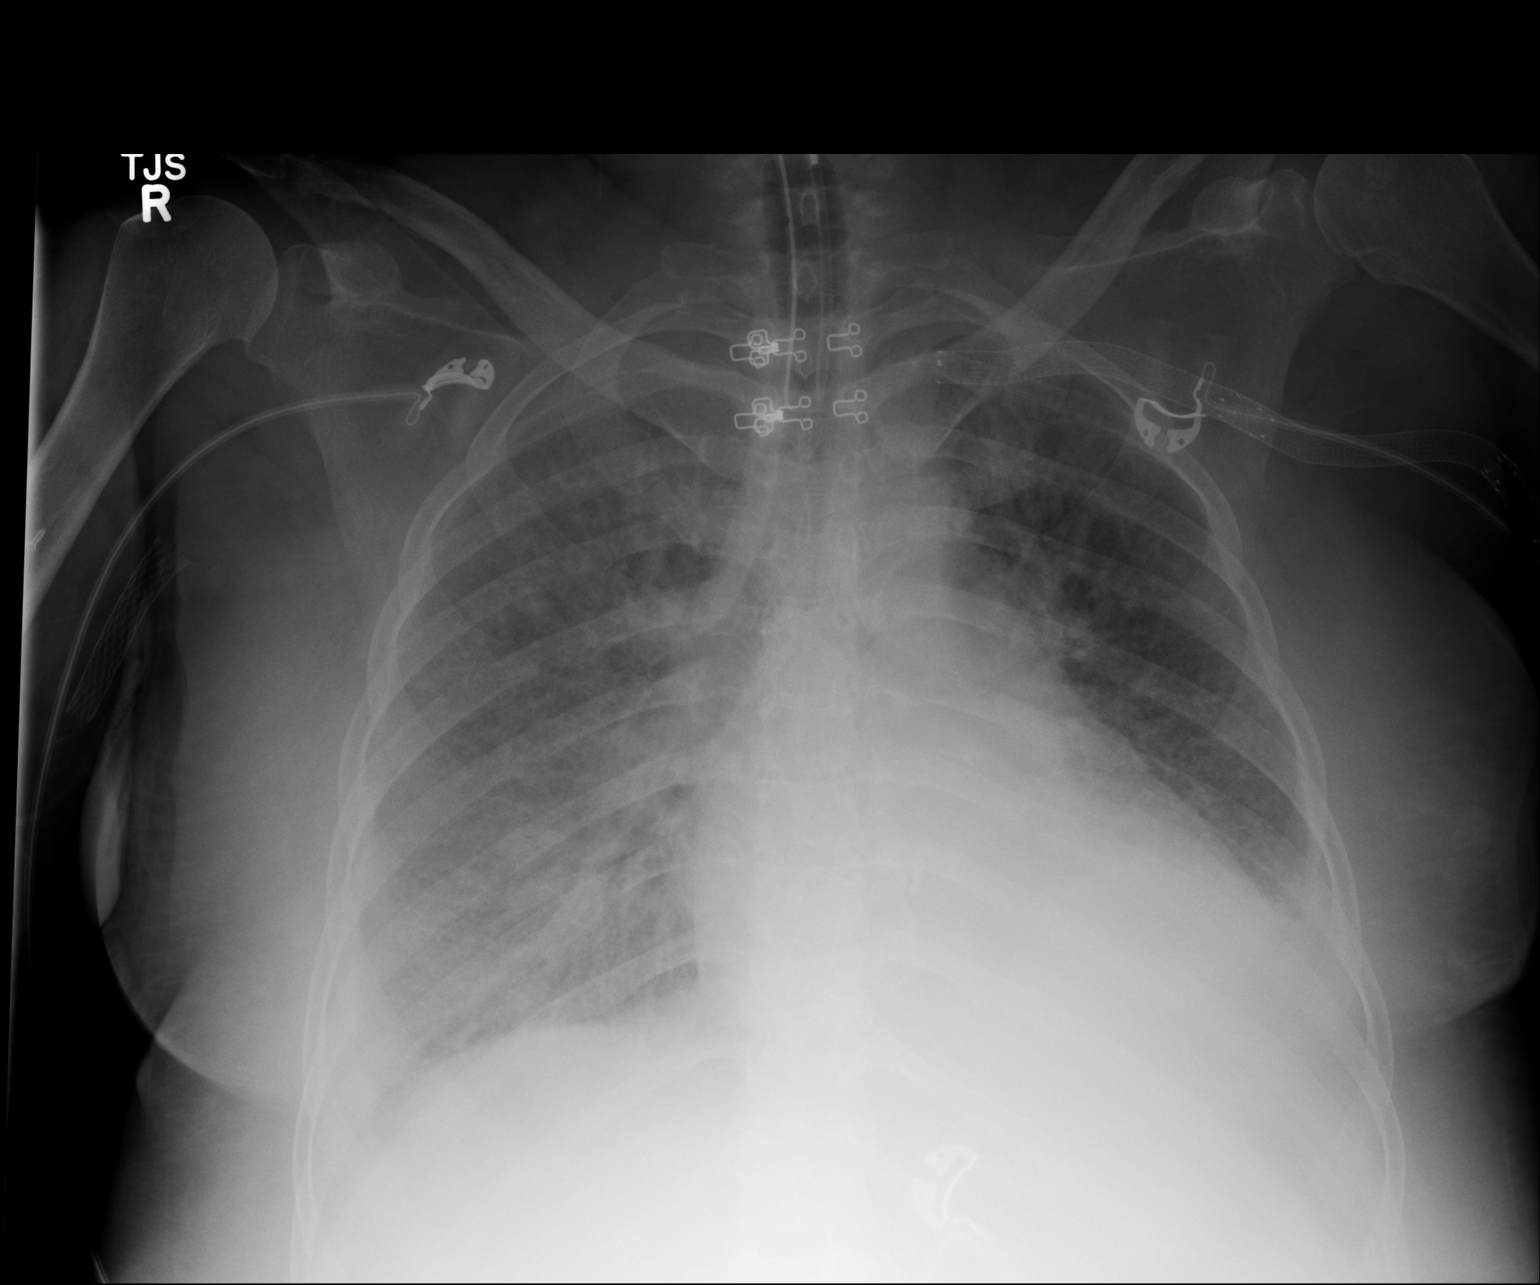
[im 2/2]
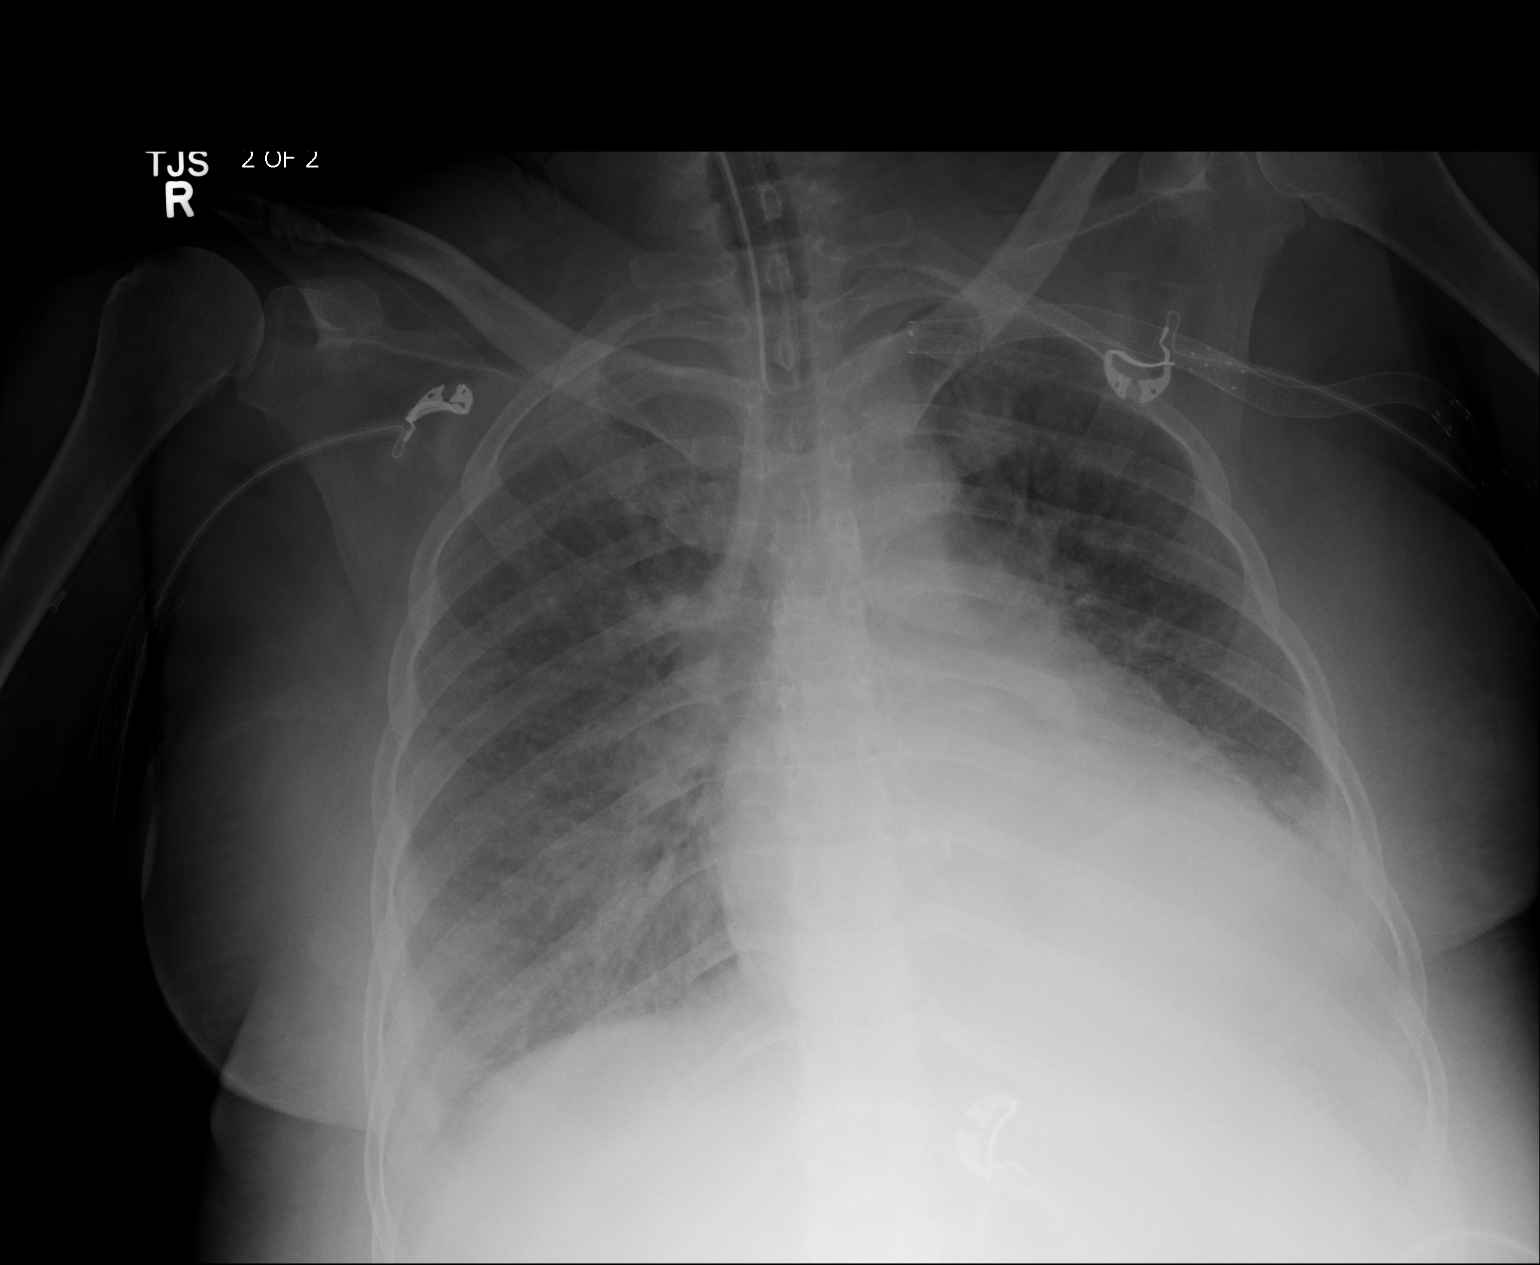

[2 of 2 positions shown; findings below may reference images not displayed]

FINDINGS: Interval intubation, distal tip projects 4.7 cm above the carina.

Cardiac silhouette appears mildly enlarged, similar to prior
examination. Central pulmonary vascular congestion and diffuse
interstitial prominence. Small bilateral pleural effusions. Elevated
LEFT hemidiaphragm with atelectasis. Apical pleural capping, no
pneumothorax. LEFT subclavian vascular stent. Osseous structure
nonsuspicious.
IMPRESSION: Endotracheal tube tip projects 4.7 cm above the carina.

Mild cardiomegaly, interstitial pulmonary edema with small pleural
effusions. Elevated LEFT hemidiaphragm with atelectasis.

## 2015-08-30 IMAGING — CR DG KNEE 1-2V PORT*L*
1 series · 2 of 2 positions shown · non-contrast
Comparison: None.

CLINICAL DATA: Pain for 1 day.  No history of trauma

EXAM:
PORTABLE LEFT KNEE - 1-2 VIEW

[Series 1: ap · 0.17mm/px · 2 of 2 slices shown]
[im 1/2]
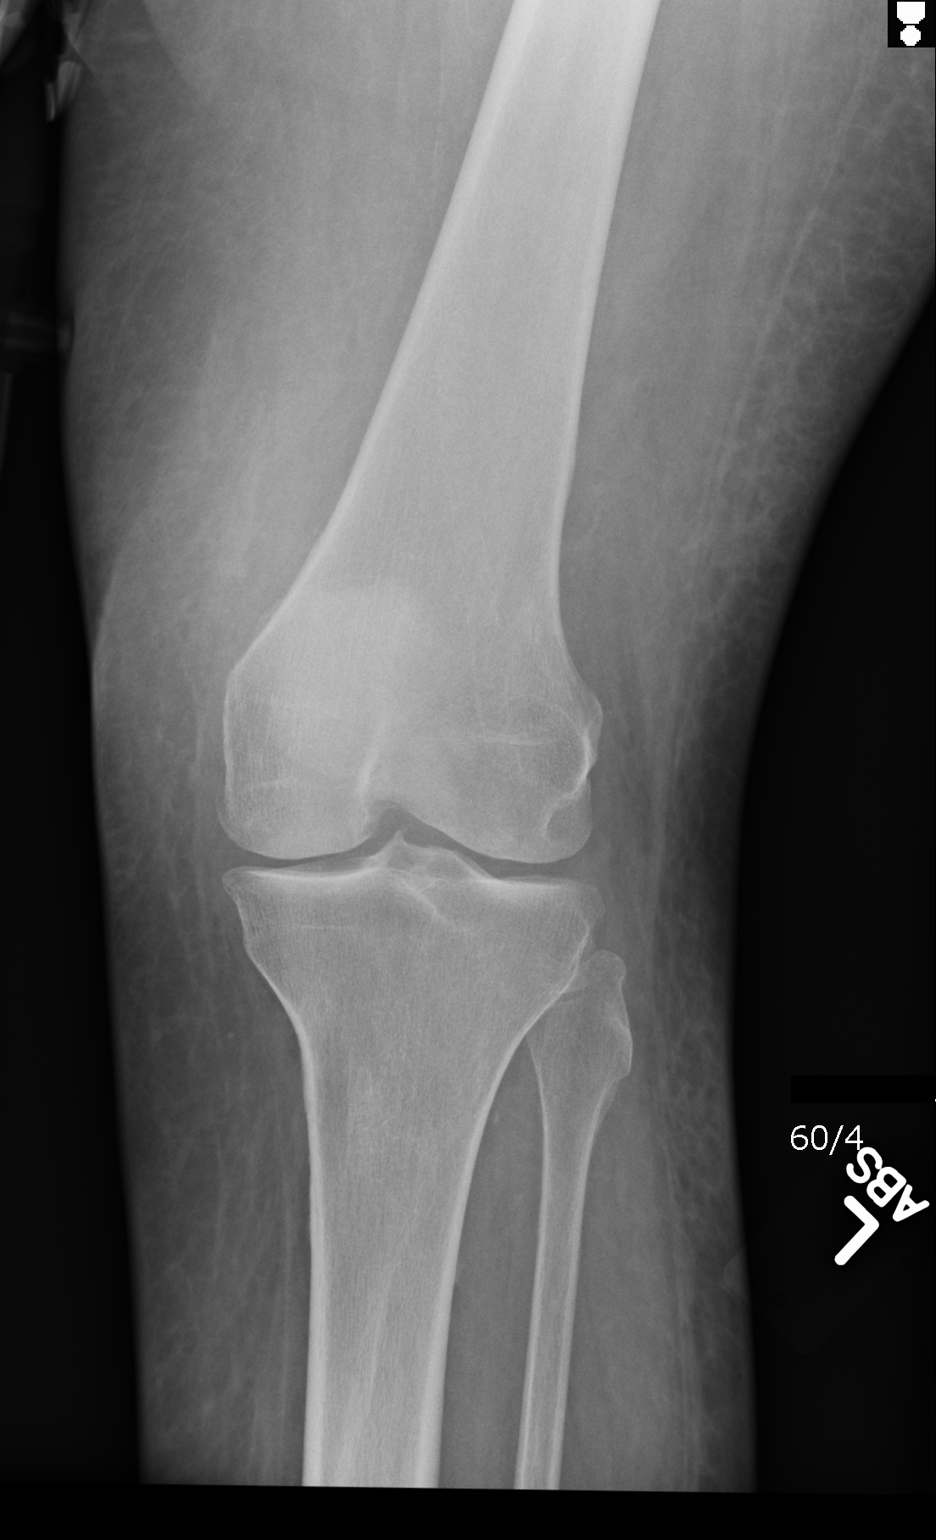
[im 2/2]
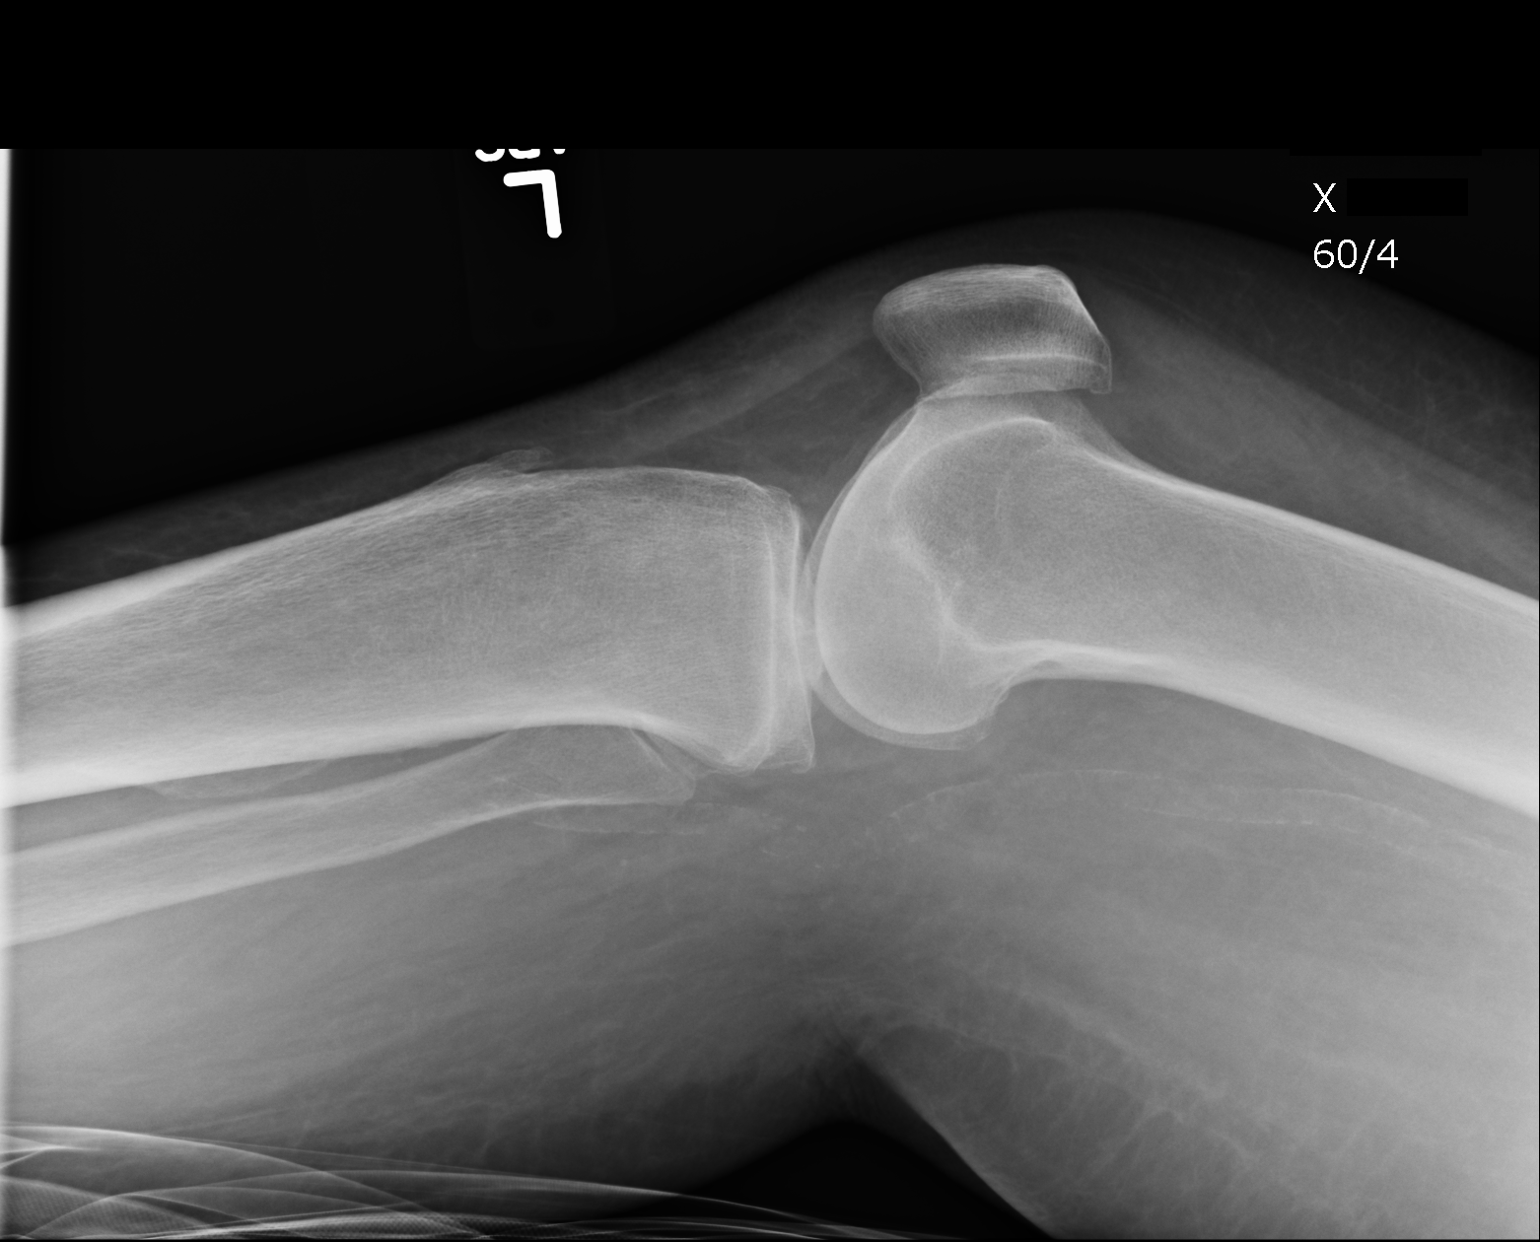

[2 of 2 positions shown; findings below may reference images not displayed]

FINDINGS: Frontal and lateral views were obtained. No fracture or dislocation.
No joint effusion. There is mild narrowing of the medial and lateral
compartments. No erosive change.
IMPRESSION: Osteoarthritic change.  No fracture or effusion.

## 2015-08-30 IMAGING — CT CT HEAD W/O CM
3 of 8 series · 13 of 30 positions shown, 14 images · non-contrast
Comparison: 04/16/2015

CLINICAL DATA: Hypoglycemic.  Unresponsive.

EXAM:
CT HEAD WITHOUT CONTRAST
TECHNIQUE: Contiguous axial images were obtained from the base of the skull
through the vertex without intravenous contrast.

[Series 2: soft tissue · axial · 0.44mm/px · z∈[-187,-42]mm · 3 of 30 slices shown, 4 images]
[im 1/30  brain]
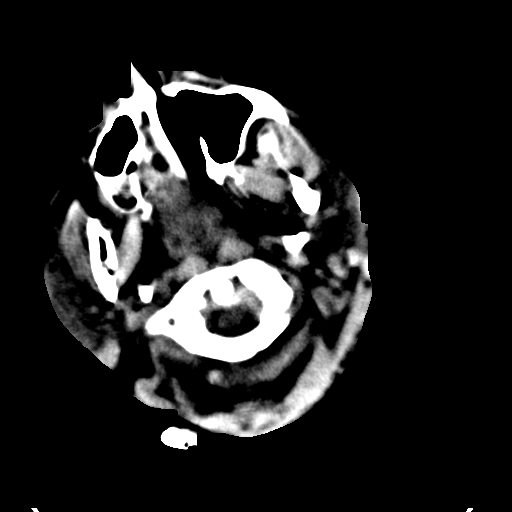
[im 1/30  bone]
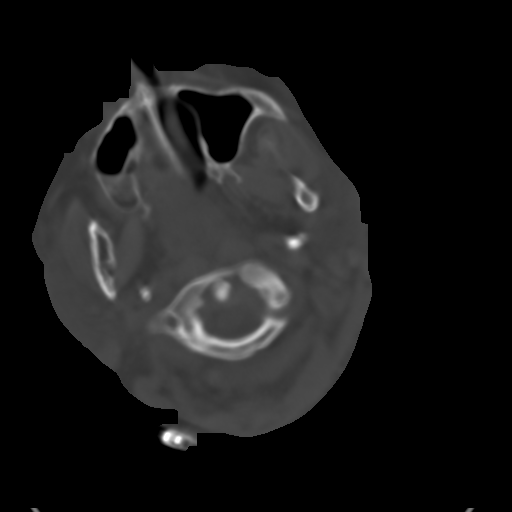
[im 15/30  brain]
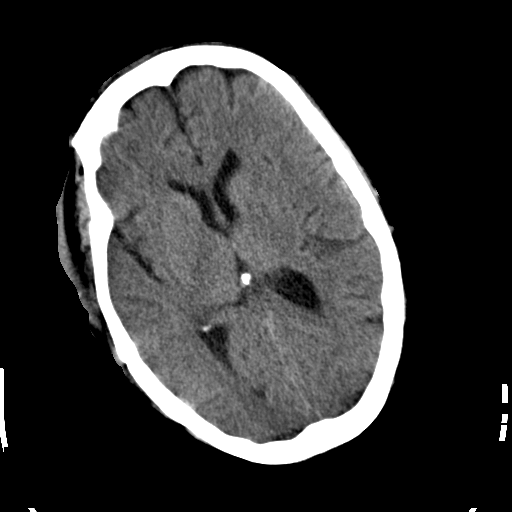
[im 30/30  brain]
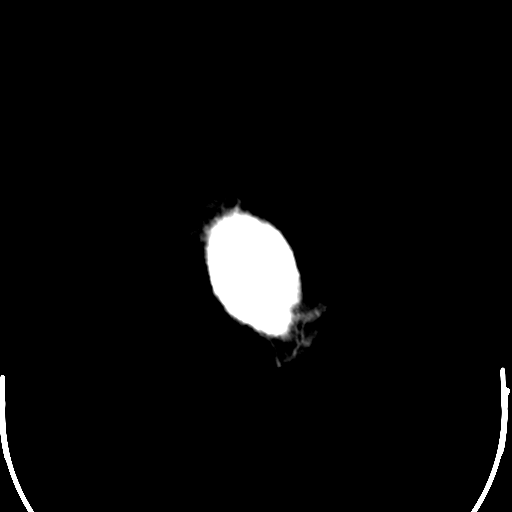

[Series 6: head bone · axial · 0.43mm/px · z∈[-172,-52]mm · 5 of 92 slices shown (1 of 2)]
[im 16/92  bone]
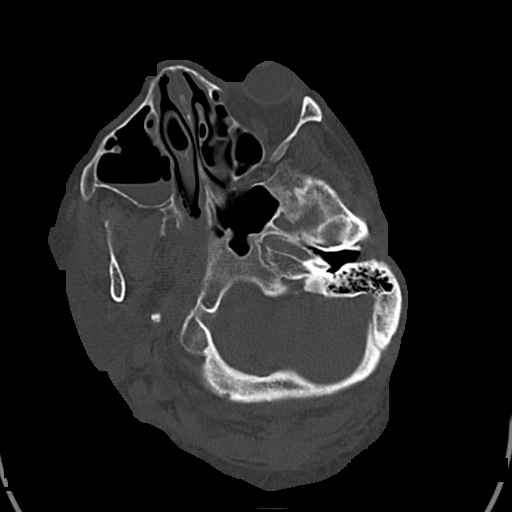
[im 31/92  bone]
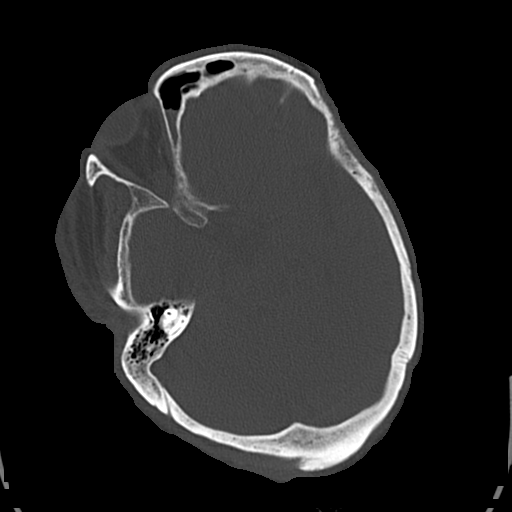
[im 46/92  bone]
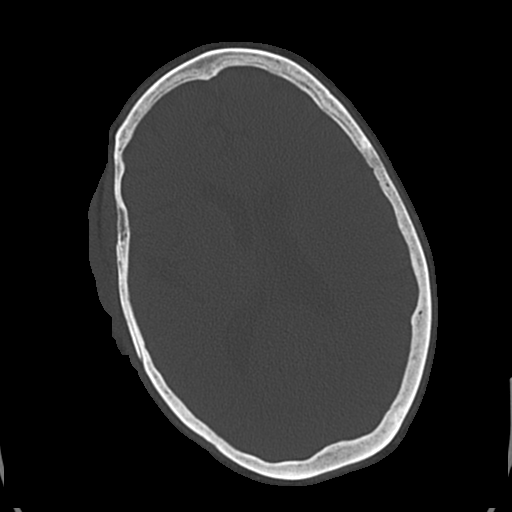
[im 61/92  bone]
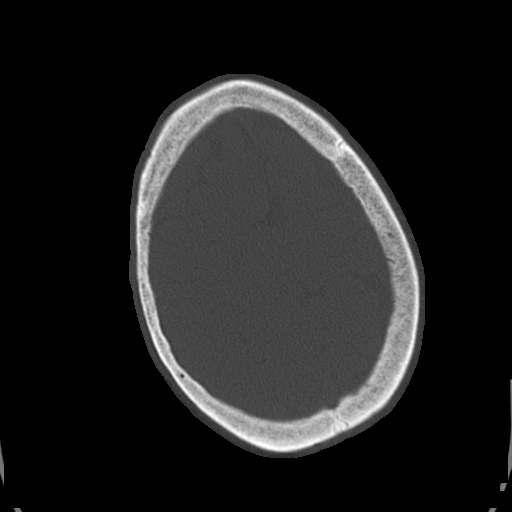
[im 76/92  bone]
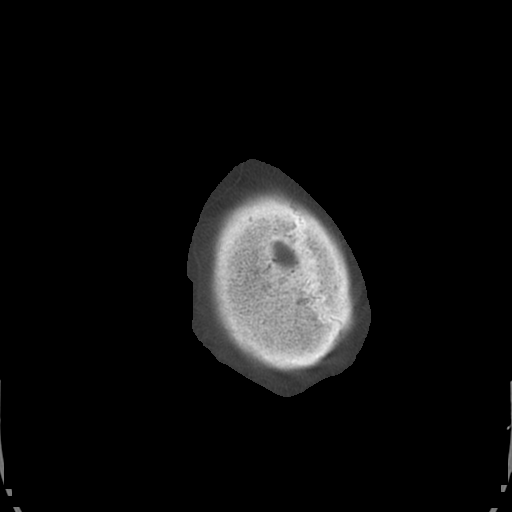

[Series 14: head bone · axial · 0.43mm/px · z∈[-172,-52]mm · 5 of 92 slices shown (2 of 2)]
[im 16/92  bone]
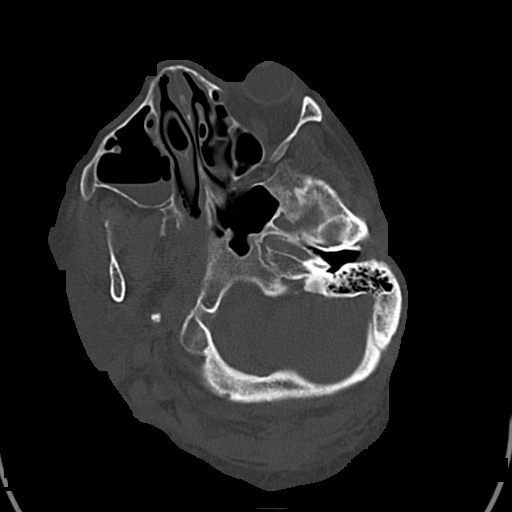
[im 31/92  bone]
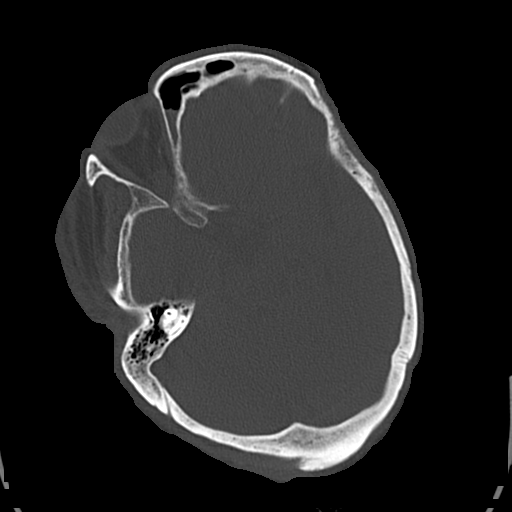
[im 46/92  bone]
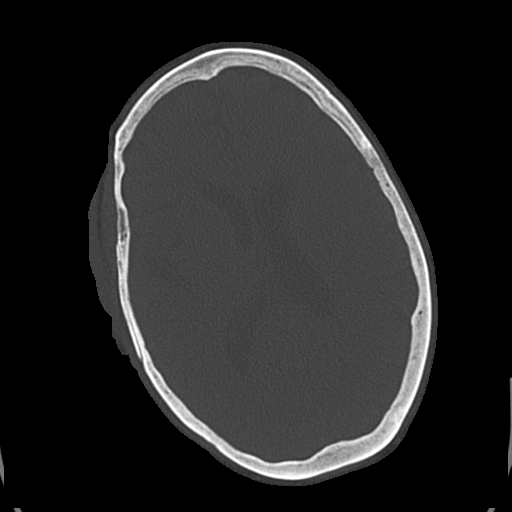
[im 61/92  bone]
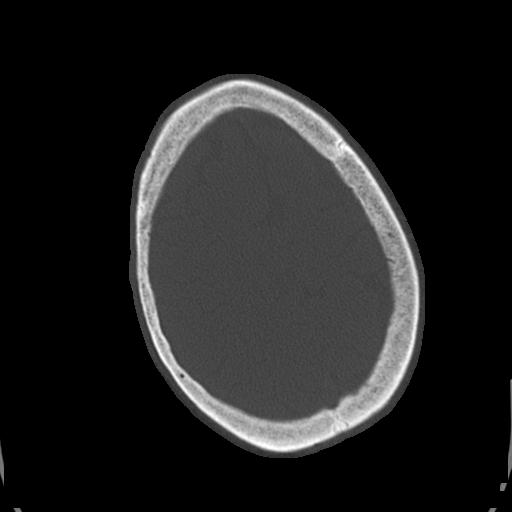
[im 76/92  bone]
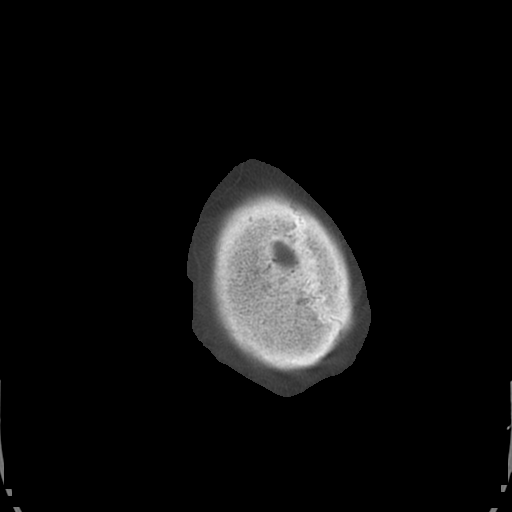

[13 of 30 positions shown; findings below may reference images not displayed]

FINDINGS: There is no intracranial hemorrhage, mass or evidence of acute
infarction. There is no extra-axial fluid collection. Gray matter
and white matter appear normal. Cerebral volume is normal for age.
Brainstem and posterior fossa are unremarkable. The CSF spaces
appear normal.

The bony structures are intact. There is a right maxillary sinus
air-fluid level and right frontal sinus air-fluid level. The other
paranasal sinuses are clear.
IMPRESSION: 1. Normal brain
2. Right maxillary and right frontal sinus air-fluid levels.

## 2015-08-31 ENCOUNTER — Emergency Department: Payer: Medicare Other

## 2015-08-31 ENCOUNTER — Inpatient Hospital Stay
Admission: EM | Admit: 2015-08-31 | Discharge: 2015-09-03 | DRG: 871 | Disposition: A | Discharge status: Home / Self Care | Payer: Medicare Other | Attending: Internal Medicine | Admitting: Internal Medicine

## 2015-08-31 DIAGNOSIS — N2581 Secondary hyperparathyroidism of renal origin: Secondary | ICD-10-CM | POA: Diagnosis present

## 2015-08-31 DIAGNOSIS — Z7902 Long term (current) use of antithrombotics/antiplatelets: Secondary | ICD-10-CM | POA: Diagnosis not present

## 2015-08-31 DIAGNOSIS — Z79899 Other long term (current) drug therapy: Secondary | ICD-10-CM

## 2015-08-31 DIAGNOSIS — A419 Sepsis, unspecified organism: Secondary | ICD-10-CM | POA: Diagnosis present

## 2015-08-31 DIAGNOSIS — J449 Chronic obstructive pulmonary disease, unspecified: Secondary | ICD-10-CM | POA: Diagnosis present

## 2015-08-31 DIAGNOSIS — N186 End stage renal disease: Secondary | ICD-10-CM | POA: Diagnosis present

## 2015-08-31 DIAGNOSIS — I252 Old myocardial infarction: Secondary | ICD-10-CM

## 2015-08-31 DIAGNOSIS — D696 Thrombocytopenia, unspecified: Secondary | ICD-10-CM | POA: Diagnosis present

## 2015-08-31 DIAGNOSIS — J45909 Unspecified asthma, uncomplicated: Secondary | ICD-10-CM | POA: Diagnosis present

## 2015-08-31 DIAGNOSIS — J189 Pneumonia, unspecified organism: Secondary | ICD-10-CM | POA: Diagnosis present

## 2015-08-31 DIAGNOSIS — D631 Anemia in chronic kidney disease: Secondary | ICD-10-CM | POA: Diagnosis present

## 2015-08-31 DIAGNOSIS — E11319 Type 2 diabetes mellitus with unspecified diabetic retinopathy without macular edema: Secondary | ICD-10-CM | POA: Diagnosis present

## 2015-08-31 DIAGNOSIS — K219 Gastro-esophageal reflux disease without esophagitis: Secondary | ICD-10-CM | POA: Diagnosis present

## 2015-08-31 DIAGNOSIS — E1143 Type 2 diabetes mellitus with diabetic autonomic (poly)neuropathy: Secondary | ICD-10-CM | POA: Diagnosis present

## 2015-08-31 DIAGNOSIS — N3000 Acute cystitis without hematuria: Secondary | ICD-10-CM | POA: Diagnosis present

## 2015-08-31 DIAGNOSIS — Z91048 Other nonmedicinal substance allergy status: Secondary | ICD-10-CM

## 2015-08-31 DIAGNOSIS — F419 Anxiety disorder, unspecified: Secondary | ICD-10-CM | POA: Diagnosis present

## 2015-08-31 DIAGNOSIS — F17210 Nicotine dependence, cigarettes, uncomplicated: Secondary | ICD-10-CM | POA: Diagnosis present

## 2015-08-31 DIAGNOSIS — I340 Nonrheumatic mitral (valve) insufficiency: Secondary | ICD-10-CM | POA: Diagnosis present

## 2015-08-31 DIAGNOSIS — Z794 Long term (current) use of insulin: Secondary | ICD-10-CM

## 2015-08-31 DIAGNOSIS — Z992 Dependence on renal dialysis: Secondary | ICD-10-CM

## 2015-08-31 DIAGNOSIS — E785 Hyperlipidemia, unspecified: Secondary | ICD-10-CM | POA: Diagnosis present

## 2015-08-31 DIAGNOSIS — Z888 Allergy status to other drugs, medicaments and biological substances status: Secondary | ICD-10-CM

## 2015-08-31 DIAGNOSIS — F329 Major depressive disorder, single episode, unspecified: Secondary | ICD-10-CM | POA: Diagnosis present

## 2015-08-31 DIAGNOSIS — I130 Hypertensive heart and chronic kidney disease with heart failure and stage 1 through stage 4 chronic kidney disease, or unspecified chronic kidney disease: Secondary | ICD-10-CM | POA: Diagnosis present

## 2015-08-31 DIAGNOSIS — K3184 Gastroparesis: Secondary | ICD-10-CM | POA: Diagnosis present

## 2015-08-31 DIAGNOSIS — E1140 Type 2 diabetes mellitus with diabetic neuropathy, unspecified: Secondary | ICD-10-CM | POA: Diagnosis present

## 2015-08-31 DIAGNOSIS — Z7982 Long term (current) use of aspirin: Secondary | ICD-10-CM | POA: Diagnosis not present

## 2015-08-31 DIAGNOSIS — E1122 Type 2 diabetes mellitus with diabetic chronic kidney disease: Secondary | ICD-10-CM | POA: Diagnosis present

## 2015-08-31 DIAGNOSIS — R059 Cough, unspecified: Secondary | ICD-10-CM

## 2015-08-31 DIAGNOSIS — T50905A Adverse effect of unspecified drugs, medicaments and biological substances, initial encounter: Secondary | ICD-10-CM | POA: Diagnosis present

## 2015-08-31 DIAGNOSIS — I2510 Atherosclerotic heart disease of native coronary artery without angina pectoris: Secondary | ICD-10-CM | POA: Diagnosis present

## 2015-08-31 DIAGNOSIS — R0902 Hypoxemia: Secondary | ICD-10-CM

## 2015-08-31 DIAGNOSIS — R05 Cough: Secondary | ICD-10-CM

## 2015-08-31 DIAGNOSIS — I5032 Chronic diastolic (congestive) heart failure: Secondary | ICD-10-CM | POA: Diagnosis present

## 2015-08-31 DIAGNOSIS — R509 Fever, unspecified: Secondary | ICD-10-CM

## 2015-08-31 LAB — COMPREHENSIVE METABOLIC PANEL
ALT: 15 U/L (ref 14–54)
AST: 22 U/L (ref 15–41)
Albumin: 2.9 g/dL — ABNORMAL LOW (ref 3.5–5.0)
Alkaline Phosphatase: 192 U/L — ABNORMAL HIGH (ref 38–126)
Anion gap: 12 (ref 5–15)
BUN: 7 mg/dL (ref 6–20)
CO2: 29 mmol/L (ref 22–32)
Calcium: 8 mg/dL — ABNORMAL LOW (ref 8.9–10.3)
Chloride: 96 mmol/L — ABNORMAL LOW (ref 101–111)
Creatinine, Ser: 2.19 mg/dL — ABNORMAL HIGH (ref 0.44–1.00)
GFR calc Af Amer: 28 mL/min — ABNORMAL LOW (ref >60)
GFR calc non Af Amer: 24 mL/min — ABNORMAL LOW (ref >60)
Glucose, Bld: 77 mg/dL (ref 65–99)
Potassium: 3.6 mmol/L (ref 3.5–5.1)
Sodium: 137 mmol/L (ref 135–145)
Total Bilirubin: 0.8 mg/dL (ref 0.3–1.2)
Total Protein: 7.2 g/dL (ref 6.5–8.1)

## 2015-08-31 LAB — CBC WITH DIFFERENTIAL/PLATELET
Basophils Absolute: 0.1 10*3/uL (ref 0–0.1)
Basophils Relative: 1 %
Eosinophils Absolute: 0.3 10*3/uL (ref 0–0.7)
Eosinophils Relative: 5 %
HCT: 33.8 % — ABNORMAL LOW (ref 35.0–47.0)
Hemoglobin: 10.9 g/dL — ABNORMAL LOW (ref 12.0–16.0)
Lymphocytes Relative: 5 %
Lymphs Abs: 0.3 10*3/uL — ABNORMAL LOW (ref 1.0–3.6)
MCH: 27.1 pg (ref 26.0–34.0)
MCHC: 32.2 g/dL (ref 32.0–36.0)
MCV: 84.1 fL (ref 80.0–100.0)
Monocytes Absolute: 0.7 10*3/uL (ref 0.2–0.9)
Monocytes Relative: 13 %
Neutro Abs: 4 10*3/uL (ref 1.4–6.5)
Neutrophils Relative %: 76 %
Platelets: 107 10*3/uL — ABNORMAL LOW (ref 150–440)
RBC: 4.02 MIL/uL (ref 3.80–5.20)
RDW: 20 % — ABNORMAL HIGH (ref 11.5–14.5)
WBC: 5.2 10*3/uL (ref 3.6–11.0)

## 2015-08-31 LAB — URINALYSIS COMPLETE WITH MICROSCOPIC (ARMC ONLY)
Bacteria, UA: NONE SEEN
Bilirubin Urine: NEGATIVE
Glucose, UA: NEGATIVE mg/dL
Hgb urine dipstick: NEGATIVE
Ketones, ur: NEGATIVE mg/dL
Nitrite: NEGATIVE
Protein, ur: 100 mg/dL — AB
Specific Gravity, Urine: 1.009 (ref 1.005–1.030)
pH: 9 — ABNORMAL HIGH (ref 5.0–8.0)

## 2015-08-31 LAB — GLUCOSE, CAPILLARY
Glucose-Capillary: 56 mg/dL — ABNORMAL LOW (ref 65–99)
Glucose-Capillary: 91 mg/dL (ref 65–99)

## 2015-08-31 LAB — INFLUENZA PANEL BY PCR (TYPE A & B)
H1N1 flu by pcr: NOT DETECTED
Influenza A By PCR: NEGATIVE
Influenza B By PCR: NEGATIVE

## 2015-08-31 LAB — LACTIC ACID, PLASMA
Lactic Acid, Venous: 1.2 mmol/L (ref 0.5–2.0)
Lactic Acid, Venous: 0.8 mmol/L (ref 0.5–2.0)

## 2015-08-31 MED ORDER — CLOPIDOGREL BISULFATE 75 MG PO TABS
75.0000 mg | ORAL_TABLET | Freq: Every day | ORAL | Status: DC
  Administered 2015-08-31 – 2015-09-03 (×4): 75 mg via ORAL
  Filled 2015-08-31 (×4): qty 1

## 2015-08-31 MED ORDER — HEPARIN SODIUM (PORCINE) 5000 UNIT/ML IJ SOLN
5000.0000 [IU] | Freq: Three times a day (TID) | INTRAMUSCULAR | Status: DC
  Administered 2015-08-31 – 2015-09-03 (×8): 5000 [IU] via SUBCUTANEOUS
  Filled 2015-08-31 (×8): qty 1

## 2015-08-31 MED ORDER — VITAMIN B-12 1000 MCG PO TABS
1000.0000 ug | ORAL_TABLET | Freq: Every day | ORAL | Status: DC
Start: 2015-08-31 — End: 2015-09-03
  Administered 2015-08-31 – 2015-09-03 (×4): 1000 ug via ORAL
  Filled 2015-08-31 (×4): qty 1

## 2015-08-31 MED ORDER — VANCOMYCIN HCL 500 MG IV SOLR
500.0000 mg | INTRAVENOUS | Status: DC
  Administered 2015-09-03: 500 mg via INTRAVENOUS
  Filled 2015-08-31: qty 500

## 2015-08-31 MED ORDER — CHOLECALCIFEROL 10 MCG (400 UNIT) PO TABS
400.0000 [IU] | ORAL_TABLET | Freq: Every day | ORAL | Status: DC
  Administered 2015-08-31 – 2015-09-03 (×4): 400 [IU] via ORAL
  Filled 2015-08-31 (×4): qty 1

## 2015-08-31 MED ORDER — PANCRELIPASE (LIP-PROT-AMYL) 12000-38000 UNITS PO CPEP
12000.0000 [IU] | ORAL_CAPSULE | Freq: Three times a day (TID) | ORAL | Status: DC
  Administered 2015-08-31 – 2015-09-03 (×9): 12000 [IU] via ORAL
  Filled 2015-08-31 (×10): qty 1

## 2015-08-31 MED ORDER — RENA-VITE PO TABS
1.0000 | ORAL_TABLET | Freq: Every day | ORAL | Status: DC
Start: 2015-08-31 — End: 2015-09-03
  Administered 2015-08-31 – 2015-09-03 (×3): 1 via ORAL
  Filled 2015-08-31 (×4): qty 1

## 2015-08-31 MED ORDER — LORATADINE 10 MG PO TABS
10.0000 mg | ORAL_TABLET | Freq: Every day | ORAL | Status: DC
  Administered 2015-08-31 – 2015-09-03 (×4): 10 mg via ORAL
  Filled 2015-08-31 (×4): qty 1

## 2015-08-31 MED ORDER — HYDROCODONE-ACETAMINOPHEN 5-325 MG PO TABS
1.0000 | ORAL_TABLET | Freq: Two times a day (BID) | ORAL | Status: DC | PRN
  Administered 2015-08-31 – 2015-09-03 (×4): 1 via ORAL
  Filled 2015-08-31 (×4): qty 1

## 2015-08-31 MED ORDER — INSULIN DETEMIR 100 UNIT/ML ~~LOC~~ SOLN
6.0000 [IU] | Freq: Every day | SUBCUTANEOUS | Status: DC
  Administered 2015-09-01 – 2015-09-02 (×2): 6 [IU] via SUBCUTANEOUS
  Filled 2015-08-31 (×4): qty 0.06

## 2015-08-31 MED ORDER — CARVEDILOL 25 MG PO TABS
25.0000 mg | ORAL_TABLET | Freq: Two times a day (BID) | ORAL | Status: DC
  Administered 2015-08-31 – 2015-09-03 (×6): 25 mg via ORAL
  Filled 2015-08-31 (×7): qty 1

## 2015-08-31 MED ORDER — ALBUTEROL SULFATE (2.5 MG/3ML) 0.083% IN NEBU: 2.5000 mg | INHALATION_SOLUTION | RESPIRATORY_TRACT | Status: DC | PRN

## 2015-08-31 MED ORDER — VANCOMYCIN HCL 10 G IV SOLR
1250.0000 mg | Freq: Once | INTRAVENOUS | Status: AC
  Administered 2015-08-31: 1250 mg via INTRAVENOUS
  Filled 2015-08-31: qty 1250

## 2015-08-31 MED ORDER — SODIUM CHLORIDE 0.9 % IJ SOLN: 3.0000 mL | INTRAMUSCULAR | Status: DC | PRN

## 2015-08-31 MED ORDER — SODIUM CHLORIDE 0.9 % IJ SOLN
3.0000 mL | Freq: Two times a day (BID) | INTRAMUSCULAR | Status: DC
  Administered 2015-09-01 – 2015-09-03 (×5): 3 mL via INTRAVENOUS

## 2015-08-31 MED ORDER — AMLODIPINE BESYLATE 10 MG PO TABS
10.0000 mg | ORAL_TABLET | Freq: Every day | ORAL | Status: DC
  Administered 2015-09-01 – 2015-09-03 (×3): 10 mg via ORAL
  Filled 2015-08-31 (×4): qty 1

## 2015-08-31 MED ORDER — RISAQUAD PO CAPS
1.0000 | ORAL_CAPSULE | Freq: Every day | ORAL | Status: DC
  Administered 2015-08-31 – 2015-09-03 (×4): 1 via ORAL
  Filled 2015-08-31 (×4): qty 1

## 2015-08-31 MED ORDER — IBUPROFEN 600 MG PO TABS
600.0000 mg | ORAL_TABLET | Freq: Once | ORAL | Status: AC
  Administered 2015-08-31: 600 mg via ORAL
  Filled 2015-08-31: qty 1

## 2015-08-31 MED ORDER — SODIUM CHLORIDE 0.9 % IV SOLN: 250.0000 mL | INTRAVENOUS | Status: DC | PRN

## 2015-08-31 MED ORDER — AMIODARONE HCL 200 MG PO TABS
400.0000 mg | ORAL_TABLET | Freq: Every day | ORAL | Status: DC
  Administered 2015-08-31 – 2015-09-03 (×4): 400 mg via ORAL
  Filled 2015-08-31 (×4): qty 2

## 2015-08-31 MED ORDER — PIPERACILLIN-TAZOBACTAM 3.375 G IVPB 30 MIN
3.3750 g | Freq: Once | INTRAVENOUS | Status: AC
  Administered 2015-08-31: 3.375 g via INTRAVENOUS
  Filled 2015-08-31: qty 50

## 2015-08-31 MED ORDER — CITALOPRAM HYDROBROMIDE 20 MG PO TABS
20.0000 mg | ORAL_TABLET | Freq: Every day | ORAL | Status: DC
  Administered 2015-08-31 – 2015-09-03 (×4): 20 mg via ORAL
  Filled 2015-08-31 (×4): qty 1

## 2015-08-31 MED ORDER — METOPROLOL TARTRATE 50 MG PO TABS
50.0000 mg | ORAL_TABLET | Freq: Two times a day (BID) | ORAL | Status: DC
  Administered 2015-09-01 – 2015-09-03 (×5): 50 mg via ORAL
  Filled 2015-08-31 (×7): qty 1

## 2015-08-31 MED ORDER — ATORVASTATIN CALCIUM 20 MG PO TABS
20.0000 mg | ORAL_TABLET | Freq: Every evening | ORAL | Status: DC
  Administered 2015-08-31 – 2015-09-03 (×4): 20 mg via ORAL
  Filled 2015-08-31 (×4): qty 1

## 2015-08-31 MED ORDER — PIPERACILLIN-TAZOBACTAM 3.375 G IVPB 30 MIN
3.3750 g | Freq: Three times a day (TID) | INTRAVENOUS | Status: DC
  Administered 2015-08-31: 18:00:00 3.375 g via INTRAVENOUS
  Filled 2015-08-31 (×3): qty 50

## 2015-08-31 MED ORDER — FAMOTIDINE 20 MG PO TABS
20.0000 mg | ORAL_TABLET | Freq: Every day | ORAL | Status: DC
  Administered 2015-08-31 – 2015-09-03 (×4): 20 mg via ORAL
  Filled 2015-08-31 (×4): qty 1

## 2015-08-31 MED ORDER — ACETAMINOPHEN 325 MG PO TABS
ORAL_TABLET | ORAL | Status: AC
  Administered 2015-08-31: 650 mg via ORAL
  Filled 2015-08-31: qty 2

## 2015-08-31 MED ORDER — NITROGLYCERIN 0.4 MG SL SUBL
0.4000 mg | SUBLINGUAL_TABLET | SUBLINGUAL | Status: DC | PRN
Start: 2015-08-31 — End: 2015-09-03

## 2015-08-31 MED ORDER — PIPERACILLIN-TAZOBACTAM 3.375 G IVPB
3.3750 g | Freq: Two times a day (BID) | INTRAVENOUS | Status: DC
  Administered 2015-09-01 – 2015-09-03 (×5): 3.375 g via INTRAVENOUS
  Filled 2015-08-31 (×8): qty 50

## 2015-08-31 MED ORDER — POLYETHYLENE GLYCOL 3350 17 G PO PACK: 17.0000 g | PACK | Freq: Every day | ORAL | Status: DC | PRN

## 2015-08-31 MED ORDER — RANOLAZINE ER 500 MG PO TB12
1000.0000 mg | ORAL_TABLET | Freq: Two times a day (BID) | ORAL | Status: DC
Start: 2015-08-31 — End: 2015-09-03
  Administered 2015-08-31 – 2015-09-03 (×5): 1000 mg via ORAL
  Filled 2015-08-31 (×7): qty 2

## 2015-08-31 MED ORDER — SEVELAMER CARBONATE 800 MG PO TABS
1600.0000 mg | ORAL_TABLET | Freq: Three times a day (TID) | ORAL | Status: DC
  Administered 2015-09-01 – 2015-09-03 (×8): 1600 mg via ORAL
  Filled 2015-08-31 (×10): qty 2

## 2015-08-31 MED ORDER — PREGABALIN 25 MG PO CAPS: 25.0000 mg | ORAL_CAPSULE | Freq: Two times a day (BID) | ORAL | Status: DC | PRN

## 2015-08-31 MED ORDER — ASPIRIN EC 81 MG PO TBEC
81.0000 mg | DELAYED_RELEASE_TABLET | Freq: Every day | ORAL | Status: DC
  Administered 2015-08-31 – 2015-09-03 (×4): 81 mg via ORAL
  Filled 2015-08-31 (×4): qty 1

## 2015-08-31 MED ORDER — METOCLOPRAMIDE HCL 5 MG PO TABS
5.0000 mg | ORAL_TABLET | Freq: Three times a day (TID) | ORAL | Status: DC
  Administered 2015-08-31 – 2015-09-03 (×12): 5 mg via ORAL
  Filled 2015-08-31 (×12): qty 1

## 2015-08-31 MED ORDER — ACETAMINOPHEN 325 MG PO TABS
650.0000 mg | ORAL_TABLET | Freq: Once | ORAL | Status: AC
  Administered 2015-08-31: 650 mg via ORAL

## 2015-08-31 MED ORDER — MECLIZINE HCL 25 MG PO TABS: 25.0000 mg | ORAL_TABLET | Freq: Two times a day (BID) | ORAL | Status: DC | PRN

## 2015-08-31 NOTE — Progress Notes (Signed)
Pt is alert and oriented, admitted from ED, troubles breathing and febrile during dialysis, regular dialysis schedule on MWF. Low grade temp at time of admission, on 2 l acute oxygen, c/o headache, given hydrocodone, chest xray showed pleural effusions, last bm on 12/9 per pt. FSBS low at admission and improved with meal. Uneventful shift.

## 2015-08-31 NOTE — Progress Notes (Signed)
ANTIBIOTIC CONSULT NOTE - INITIAL  Pharmacy Consult for piperacillin/tazobactam Indication: pneumonia  Allergies  Allergen Reactions  . Compazine [Prochlorperazine Edisylate] Anaphylaxis and Nausea And Vomiting  . Ace Inhibitors Swelling  . Ativan [Lorazepam] Other (See Comments)    Reaction:  Hallucinations and headaches  . Codeine Nausea And Vomiting  . Gabapentin Other (See Comments)    Reaction:  Unknown   . Losartan Other (See Comments)    Reaction:  Unknown   . Ondansetron Other (See Comments)    Reaction:  Unknown   . Prochlorperazine Other (See Comments)    Reaction:  Unknown   . Scopolamine Other (See Comments)    Reaction:  Unknown   . Zofran [Ondansetron Hcl] Other (See Comments)    Reaction:  hallucinations   . Oxycodone Anxiety  . Tape Rash    Patient Measurements: Height: 5' 0.5" (153.7 cm) Weight: 169 lb 8.5 oz (76.9 kg) IBW/kg (Calculated) : 46.65  Vital Signs: Temp: 100.9 F (38.3 C) (12/09 1308) Temp Source: Oral (12/09 1308) BP: 138/51 mmHg (12/09 1352) Pulse Rate: 71 (12/09 1352) Intake/Output from previous day:   Intake/Output from this shift:    Labs:  Recent Labs  08/31/15 1156  WBC 5.2  HGB 10.9*  PLT 107*  CREATININE 2.19*   Estimated Creatinine Clearance: 26.6 mL/min (by C-G formula based on Cr of 2.19).  Microbiology: No results found for this or any previous visit (from the past 720 hour(s)).  Medical History: Past Medical History  Diagnosis Date  . Asthma   . Collagen vascular disease ([place])   . Diabetes mellitus without complication ([place])   . Hypertension   . Coronary artery disease     a. cath 2013: stenting to RCA (report not available); b. cath 2014: LM nl, pLAD 40%, mLAD nl, ost LCx 40%, mid LCx nl, pRCA 30% @ site of prior stent, mRCA 50%  . Diabetic neuropathy ([place])   . ESRD (end stage renal disease) on dialysis ([place])     M-W-F  . GERD (gastroesophageal reflux disease)   . Hx of pancreatitis 2015  . Myocardial  infarction ([place])   . Pneumonia   . Mitral regurgitation     a. echo 10/2013: EF 62%, noWMA, mildly dilated LA, mild to mod MR/TR, GR1DD  . COPD (chronic obstructive pulmonary disease) ([place])   . chronic diastolic CHF [id number]  . dialysis 2006  . Depression   . Anxiety   . Arthritis   . Headache   . Anemia    Medications:  Anti-infectives    Start     Dose/Rate Route Frequency Ordered Stop   08/31/15 2200  piperacillin-tazobactam (ZOSYN) IVPB 3.375 g     3.375 g 12.5 mL/hr over 240 Minutes Intravenous Every 12 hours 08/31/15 1405     08/31/15 1430  piperacillin-tazobactam (ZOSYN) IVPB 3.375 g     3.375 g 100 mL/hr over 30 Minutes Intravenous  Once 08/31/15 1401       Assessment: Pharmacy consulted to dose piperacillin/tazobactam in this 54 year old female for pneumonia. Patient is on HD MWF and is presenting with a fever of 101.9 in the ED. Patient has a history of C. Difficile and has undergone FMT in the past.   Plan:  Follow up culture results  Start piperacillin/tazobactam 3.375 g IV q12h Follow cultures and for possible de-escalation as soon as possible given history of C. Difficile.   Pharmacy will continue to monitor. Thank you for the consult.  Darylene Price Rosaleen Mazer 08/31/2015,2:05  PM

## 2015-08-31 NOTE — ED Notes (Signed)
Patient presents to the ED via EMS from dialysis.  Patient was getting dialysis treatment and was feeling nauseous, having chills, and had a temp of 99. 7 and a bp of 183/81.  Patient is febrile at time of triage and is shaking, patient reports recent cough and her oxygen saturation is 88% on room air.

## 2015-08-31 NOTE — Progress Notes (Signed)
[facility]  ROUNDING NOTE   Subjective:   Hemodialysis earlier today. After dialysis treatment, patient experienced fevers/chills, nausea, cough.   Recent fecal transplant on 12/1 by Dr. Vira Agar  Objective:  Vital signs in last 24 hours:  Temp:  [99.5 F (37.5 C)-101.9 F (38.8 C)] 99.5 F (37.5 C) (12/09 1449) Pulse Rate:  [68-92] 70 (12/09 1600) Resp:  [9-27] 22 (12/09 1600) BP: (132-159)/(50-67) 134/53 mmHg (12/09 1600) SpO2:  [87 %-100 %] 99 % (12/09 1600) Weight:  [76.9 kg (169 lb 8.5 oz)] 76.9 kg (169 lb 8.5 oz) (12/09 1111)  Weight change:  Filed Weights   08/31/15 1111  Weight: 76.9 kg (169 lb 8.5 oz)    Intake/Output:     Intake/Output this shift:     Physical Exam: General: NAD, sick appearing  Head: Normocephalic, atraumatic. Moist oral mucosal membranes  Eyes: Anicteric, PERRL  Neck: Supple, trachea midline  Lungs:  Clear to auscultation  Heart: Regular rate and rhythm  Abdomen:  Soft, nontender,   Extremities: no peripheral edema.  Neurologic: Nonfocal, moving all four extremities  Skin: No lesions  Access: Left arm AVF    Basic Metabolic Panel:  Recent Labs Lab 08/31/15 1156  NA 137  K 3.6  CL 96*  CO2 29  GLUCOSE 77  BUN 7  CREATININE 2.19*  CALCIUM 8.0*    Liver Function Tests:  Recent Labs Lab 08/31/15 1156  AST 22  ALT 15  ALKPHOS 192*  BILITOT 0.8  PROT 7.2  ALBUMIN 2.9*   No results for input(s): LIPASE, AMYLASE in the last 168 hours. No results for input(s): AMMONIA in the last 168 hours.  CBC:  Recent Labs Lab 08/31/15 1156  WBC 5.2  NEUTROABS 4.0  HGB 10.9*  HCT 33.8*  MCV 84.1  PLT 107*    Cardiac Enzymes: No results for input(s): CKTOTAL, CKMB, CKMBINDEX, TROPONINI in the last 168 hours.  BNP: Invalid input(s): POCBNP  CBG: No results for input(s): GLUCAP in the last 168 hours.  Microbiology: Results for orders placed or performed during the hospital encounter of 07/22/15  C  difficile quick scan w PCR reflex     Status: Abnormal   Collection Time: 07/22/15 12:40 PM  Result Value Ref Range Status   C Diff antigen POSITIVE (A) NEGATIVE Final    Comment: REPORTED TO SHANNON PATCH AT 1539PM BY TB   C Diff toxin NEGATIVE NEGATIVE Final   C Diff interpretation   Final    Positive for toxigenic C. difficile, active toxin production not detected. Patient has toxigenic C. difficile organisms present in the bowel, but toxin was not detected. The patient may be a carrier or the level of toxin in the sample was below the limit  of detection. This information should be used in conjunction with the patient's clinical history when deciding on possible therapy.   Clostridium Difficile by PCR     Status: Abnormal   Collection Time: 07/22/15 12:40 PM  Result Value Ref Range Status   Toxigenic C Difficile by pcr POSITIVE (A) NEGATIVE Final    Comment: CRITICAL RESULT CALLED TO, READ BACK BY AND VERIFIED WITH: SHANNON PATCH ON 07/22/15 AT 1539 BY TB     Coagulation Studies: No results for input(s): LABPROT, INR in the last 72 hours.  Urinalysis:  Recent Labs  08/31/15 1240  COLORURINE YELLOW*  LABSPEC 1.009  PHURINE 9.0*  GLUCOSEU NEGATIVE  HGBUR NEGATIVE  BILIRUBINUR NEGATIVE  KETONESUR NEGATIVE  PROTEINUR 100*  NITRITE  NEGATIVE  LEUKOCYTESUR TRACE*      Imaging: Dg Chest Portable 1 View  08/31/2015  CLINICAL DATA:  Shortness of breath. End-stage renal disease on hemodialysis. Fever. Healthcare associated pneumonia. EXAM: PORTABLE CHEST 1 VIEW COMPARISON:  07/23/2015 FINDINGS: Mild cardiomegaly stable. Diffuse interstitial prominence is suspicious for mild interstitial edema but shows mild improvement since previous study. Small bilateral pleural effusions and bibasilar atelectasis also show mild improvement. IMPRESSION: Mild improvement in diffuse interstitial edema and bilateral pleural effusions. Electronically Signed   By: Earle Gell M.D.   On: 08/31/2015  12:19     Medications:   . sodium chloride    . piperacillin-tazobactam (ZOSYN)  IV    . piperacillin-tazobactam    . vancomycin 1,250 mg (08/31/15 1603)   . acidophilus  1 capsule Oral Daily  . amiodarone  400 mg Oral Daily  . amLODipine  10 mg Oral Daily  . aspirin EC  81 mg Oral Daily  . atorvastatin  20 mg Oral QPM  . carvedilol  25 mg Oral BID  . citalopram  20 mg Oral Daily  . clopidogrel  75 mg Oral Daily  . famotidine  20 mg Oral Daily  . heparin  5,000 Units Subcutaneous 3 times per day  . insulin detemir  6 Units Subcutaneous QHS  . lipase/protease/amylase  12,000 Units Oral TID WC  . loratadine  10 mg Oral Daily  . metoCLOPramide  5 mg Oral TID AC & HS  . metoprolol  50 mg Oral BID  . multivitamin  1 tablet Oral QHS  . ranolazine  1,000 mg Oral BID  . sevelamer carbonate  1,600 mg Oral TID WC  . sodium chloride  3 mL Intravenous Q12H  . [START ON 09/03/2015] vancomycin  500 mg Intravenous Q M,W,F-HD  . vitamin B-12  1,000 mcg Oral Daily  . Vitamin D3  400 Units Oral Daily   sodium chloride, albuterol, HYDROcodone-acetaminophen, meclizine, nitroGLYCERIN, polyethylene glycol, pregabalin, sodium chloride  Assessment/ Plan:  Ms. CLEVE LLAGUNO is a 54 y.o. 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  Patient admitted on 08/31/2015   [facility] MWF [place]  1. End Stage Renal Disease: completed treatment today. No acute indication for dialysis. Continue MWF schedule. Monitor daily for dialysis need.   2. Sepsis/pneumonia:  Started on zosyn and vancomycin. Concern with history of C. Diff.   3. Hypertension: blood pressure at goal.  - Continue home regimen  4. Anemia of chronic kidney disease: hemoglobin 10.9 - get micera as outpatient.  5. Secondary  Hyperparathyroidism: outpatient phosphorus, calcium and PTH at goal.  - currently not on her binders.     LOS: 0 Tonna Palazzi 12/9/20164:32 PM

## 2015-08-31 NOTE — ED Notes (Signed)
Called Dialysis RN who stated he would come de-access patient's fistula within the next 30 minutes.

## 2015-08-31 NOTE — ED Notes (Signed)
Patient placed on 2L of oxygen.  

## 2015-08-31 NOTE — ED Provider Notes (Addendum)
[facility] Emergency Department Provider Note  ____________________________________________   I have reviewed the triage vital signs and the nursing notes.   HISTORY  Chief Complaint Fever    HPI [name] is a 54 y.o. female with a very, complicated recent medical history including pneumonia ESRD Monday Wednesday Friday dialysis pancreatitis CAD COPD not on home oxygen who presents today complaining of fever since this morning. She has been coughing for the last few days. She denies nausea vomiting or diarrhea. She did have a recent fecal transplant for C. difficile. She states her bowel movements have been good. She states she was feeling okay on the way to dialysis with developed a fever while on dialysis.  Past Medical History  Diagnosis Date  . Asthma   . Collagen vascular disease ([place])   . Diabetes mellitus without complication ([place])   . Hypertension   . Coronary artery disease     a. cath 2013: stenting to RCA (report not available); b. cath 2014: LM nl, pLAD 40%, mLAD nl, ost LCx 40%, mid LCx nl, pRCA 30% @ site of prior stent, mRCA 50%  . Diabetic neuropathy ([place])   . ESRD (end stage renal disease) on dialysis ([place])     M-W-F  . GERD (gastroesophageal reflux disease)   . Hx of pancreatitis 2015  . Myocardial infarction ([place])   . Pneumonia   . Mitral regurgitation     a. echo 10/2013: EF 62%, noWMA, mildly dilated LA, mild to mod MR/TR, GR1DD  . COPD (chronic obstructive pulmonary disease) ([place])   . chronic diastolic CHF [id number]  . dialysis 2006  . Depression   . Anxiety   . Arthritis   . Headache   . Anemia     Patient Active Problem List   Diagnosis Date Noted  . C. difficile diarrhea 07/22/2015  . Pneumonia 05/21/2015  . Hypoglycemia 04/24/2015  . Unresponsiveness 04/24/2015  . Bradycardia 04/24/2015  . Hypothermia 04/24/2015  . Acute respiratory failure ([place]) 04/24/2015  . Acute diastolic CHF (congestive heart failure) ([place])  04/05/2015  . Diabetic gastroparesis ([place]) 04/05/2015  . Hypokalemia 04/05/2015  . Generalized weakness 04/05/2015  . Acute pulmonary edema ([place]) 04/03/2015  . Nausea and vomiting 04/03/2015  . Hypoglycemia associated with diabetes ([place]) 04/03/2015  . Anemia of chronic disease 04/03/2015  . Secondary hyperparathyroidism ([place]) 04/03/2015  . Pressure ulcer 04/02/2015  . Acute respiratory failure with hypoxia ([place]) 04/01/2015  . Adjustment disorder with anxiety 03/14/2015  . Somatic symptom disorder, mild 03/08/2015  . Coronary artery disease involving native coronary artery of native heart without angina pectoris   . Nausea & vomiting 03/06/2015  . Abdominal pain 03/06/2015  . DM (diabetes mellitus) ([place]) 03/06/2015  . HTN (hypertension) 03/06/2015  . Gastroparesis 02/24/2015  . Pleural effusion 02/19/2015  . HCAP (healthcare-associated pneumonia) 02/19/2015  . End-stage renal disease on hemodialysis ([place]) 02/19/2015    Past Surgical History  Procedure Laterality Date  . Cholecystectomy    . Appendectomy    . Abdominal hysterectomy    . Dialysis fistula creation Left     upper arm  . Esophagogastroduodenoscopy N/A 03/08/2015    Procedure: ESOPHAGOGASTRODUODENOSCOPY (EGD);  Surgeon: Manya Silvas, MD;  Location: [facility] ENDOSCOPY;  Service: Endoscopy;  Laterality: N/A;  . Cardiac catheterization Left 07/26/2015    Procedure: Left Heart Cath and Coronary Angiography;  Surgeon: Dionisio David, MD;  Location: [facility] LAB;  Service: Cardiovascular;  Laterality: Left;  Marland Kitchen Eye surgery    .  Fecal transplant N/A 08/23/2015    Procedure: FECAL TRANSPLANT;  Surgeon: Manya Silvas, MD;  Location: [facility] ENDOSCOPY;  Service: Endoscopy;  Laterality: N/A;    Current Outpatient Rx  Name  Route  Sig  Dispense  Refill  . acidophilus (RISAQUAD) CAPS capsule   Oral   Take 1 capsule by mouth daily.   30 capsule   0   . albuterol (PROVENTIL) (2.5 MG/3ML) 0.083% nebulizer solution    Nebulization   Take 2.5 mg by nebulization every 4 (four) hours as needed for wheezing or shortness of breath.         Marland Kitchen amiodarone (PACERONE) 200 MG tablet   Oral   Take 400 mg by mouth daily.         Marland Kitchen amLODipine (NORVASC) 10 MG tablet   Oral   Take 10 mg by mouth daily.          Marland Kitchen aspirin EC 81 MG tablet   Oral   Take 81 mg by mouth daily.         Marland Kitchen atorvastatin (LIPITOR) 20 MG tablet   Oral   Take 20 mg by mouth every evening.         Marland Kitchen b complex-vitamin c-folic acid (NEPHRO-VITE) 0.8 MG TABS tablet   Oral   Take 1 tablet by mouth daily.         . carvedilol (COREG) 25 MG tablet   Oral   Take 25 mg by mouth 2 (two) times daily.         . cetirizine (ZYRTEC) 10 MG tablet   Oral   Take 10 mg by mouth daily.         . Cholecalciferol (VITAMIN D3) 400 UNITS tablet   Oral   Take 400 Units by mouth daily.         . citalopram (CELEXA) 20 MG tablet   Oral   Take 20 mg by mouth daily.         . clopidogrel (PLAVIX) 75 MG tablet   Oral   Take 75 mg by mouth daily.         Marland Kitchen glucose 4 GM chewable tablet   Oral   Chew 3-4 tablets by mouth as needed for low blood sugar (if pts blood sugar is less than 60).         Marland Kitchen HYDROcodone-acetaminophen (NORCO/VICODIN) 5-325 MG per tablet   Oral   Take 1 tablet by mouth 2 (two) times daily as needed for severe pain.          Marland Kitchen insulin aspart (NOVOLOG) 100 UNIT/ML injection   Subcutaneous   Inject into the skin 3 (three) times daily before meals. Pt uses as needed per sliding scale.         . insulin detemir (LEVEMIR) 100 UNIT/ML injection   Subcutaneous   Inject 6 Units into the skin at bedtime.         . lipase/protease/amylase (CREON) 12000 UNITS CPEP capsule   Oral   Take 12,000 Units by mouth 3 (three) times daily with meals.          . meclizine (ANTIVERT) 25 MG tablet   Oral   Take 25 mg by mouth 2 (two) times daily as needed for dizziness.         . metoCLOPramide (REGLAN) 5 MG  tablet   Oral   Take 1 tablet (5 mg total) by mouth 4 (four) times daily -  before meals and at bedtime.  40 tablet   0   . metoprolol (LOPRESSOR) 50 MG tablet   Oral   Take 50 mg by mouth 2 (two) times daily.         . nitroGLYCERIN (NITROSTAT) 0.4 MG SL tablet   Sublingual   Place 0.4 mg under the tongue every 5 (five) minutes x 3 doses as needed for chest pain. *Max 3 doses per episode*         . Omega-3 Fatty Acids (FISH OIL) 1000 MG CAPS   Oral   Take 1,000 mg by mouth daily.         . ondansetron (ZOFRAN ODT) 4 MG disintegrating tablet   Oral   Take 1 tablet (4 mg total) by mouth every 8 (eight) hours as needed for nausea or vomiting.   20 tablet   0   . polyethylene glycol (MIRALAX / GLYCOLAX) packet   Oral   Take 17 g by mouth daily as needed for mild constipation.         . pregabalin (LYRICA) 25 MG capsule   Oral   Take 25 mg by mouth 2 (two) times daily as needed (for leg pain).         . promethazine (PHENERGAN) 25 MG suppository   Rectal   Place 1 suppository (25 mg total) rectally every 8 (eight) hours as needed for nausea or vomiting.   12 each   0   . ranitidine (ZANTAC) 150 MG tablet   Oral   Take 150 mg by mouth daily.          . ranolazine (RANEXA) 1000 MG SR tablet   Oral   Take 1,000 mg by mouth 2 (two) times daily.         . sevelamer carbonate (RENVELA) 800 MG tablet   Oral   Take 1,600 mg by mouth 3 (three) times daily with meals.          . vitamin B-12 (CYANOCOBALAMIN) 1000 MCG tablet   Oral   Take 1,000 mcg by mouth daily.         Marland Kitchen albuterol (PROVENTIL HFA;VENTOLIN HFA) 108 (90 BASE) MCG/ACT inhaler      Inhale 4-6 puffs by mouth every 4 hours as needed for wheezing, cough, and/or shortness of breath Patient not taking: Reported on 08/31/2015   1 Inhaler   1   . docusate sodium (COLACE) 100 MG capsule   Oral   Take 1 capsule (100 mg total) by mouth 2 (two) times daily. Patient not taking: Reported on  08/31/2015   60 capsule   0   . guaiFENesin-dextromethorphan (ROBITUSSIN DM) 100-10 MG/5ML syrup   Oral   Take 10 mLs by mouth every 6 (six) hours as needed for cough. Patient not taking: Reported on 08/31/2015   118 mL   0   . metroNIDAZOLE (FLAGYL) 500 MG tablet   Oral   Take 1 tablet (500 mg total) by mouth every 8 (eight) hours. Patient not taking: Reported on 08/31/2015   36 tablet   0   . prochlorperazine (COMPAZINE) 5 MG tablet   Oral   Take 1 tablet (5 mg total) by mouth every 6 (six) hours as needed for nausea or vomiting. Patient not taking: Reported on 08/31/2015   30 tablet   1   . sucralfate (CARAFATE) 1 G tablet   Oral   Take 1 tablet (1 g total) by mouth 4 (four) times daily -  with meals and at bedtime. Patient  not taking: Reported on 08/31/2015   120 tablet   0     Allergies Compazine; Ace inhibitors; Ativan; Codeine; Gabapentin; Losartan; Ondansetron; Prochlorperazine; Scopolamine; Zofran; Oxycodone; and Tape  Family History  Problem Relation Age of Onset  . Kidney disease Mother   . Diabetes Mother     Social History Social History  Substance Use Topics  . Smoking status: Current Some Day Smoker -- 0.50 packs/day    Types: Cigarettes    Last Attempt to Quit: 02/13/2015  . Smokeless tobacco: Never Used  . Alcohol Use: No    Review of Systems Constitutional: Positive fever/chills Eyes: No visual changes. ENT: No sore throat. No stiff neck no neck pain Cardiovascular: Denies chest pain. Respiratory: Positive for cough denies shortness of breath. Gastrointestinal:   no vomiting.  No diarrhea.  No constipation. Genitourinary: Negative for dysuria. Musculoskeletal: Negative lower extremity swelling Skin: Negative for rash. Neurological: Negative for headaches, focal weakness or numbness. 10-point ROS otherwise negative.  ____________________________________________   PHYSICAL EXAM:  VITAL SIGNS: ED Triage Vitals  Enc Vitals Group      BP 08/31/15 1111 143/67 mmHg     Pulse Rate 08/31/15 1111 92     Resp 08/31/15 1130 27     Temp 08/31/15 1111 101.6 F (38.7 C)     Temp Source 08/31/15 1111 Oral     SpO2 08/31/15 1111 88 %     Weight 08/31/15 1111 169 lb 8.5 oz (76.9 kg)     Height 08/31/15 1111 5' 0.5" (1.537 m)     Head Cir --      Peak Flow --      Pain Score 08/31/15 1115 10     Pain Loc --      Pain Edu? --      Excl. in [place]? --     Constitutional: Alert and oriented. Chronically ill appearing who appears mildly uncomfortable Eyes: Conjunctivae are normal. PERRL. EOMI. Head: Atraumatic. Nose: No congestion/rhinnorhea. Mouth/Throat: Mucous membranes are moist.  Oropharynx non-erythematous. Neck: No stridor.   Nontender with no meningismus Cardiovascular: Normal rate, regular rhythm. Grossly normal heart sounds.  Good peripheral circulation. Respiratory: Normal respiratory effort.  No retractions. Lungs are diminished in the bases Abdominal: Soft and nontender. No distention. No guarding no rebound Back:  There is no focal tenderness or step off there is no midline tenderness there are no lesions noted. there is no CVA tenderness Musculoskeletal: No lower extremity tenderness. No joint effusions, no DVT signs strong distal pulses no edema Neurologic:  Normal speech and language. No gross focal neurologic deficits are appreciated.  Skin:  Skin is warm, dry and intact. Skin changes of lower extremities with no acute cellulitis noted Psychiatric: Mood and affect are normal. Speech and behavior are normal.  ____________________________________________   LABS (all labs ordered are listed, but only abnormal results are displayed)  Labs Reviewed  COMPREHENSIVE METABOLIC PANEL - Abnormal; Notable for the following:    Chloride 96 (*)    Creatinine, Ser 2.19 (*)    Calcium 8.0 (*)    Albumin 2.9 (*)    Alkaline Phosphatase 192 (*)    GFR calc non Af Amer 24 (*)    GFR calc Af Amer 28 (*)    All other  components within normal limits  CBC WITH DIFFERENTIAL/PLATELET - Abnormal; Notable for the following:    Hemoglobin 10.9 (*)    HCT 33.8 (*)    RDW 20.0 (*)    Platelets 107 (*)  Lymphs Abs 0.3 (*)    All other components within normal limits  URINALYSIS COMPLETEWITH MICROSCOPIC ([facility] ONLY) - Abnormal; Notable for the following:    Color, Urine YELLOW (*)    APPearance CLEAR (*)    pH 9.0 (*)    Protein, ur 100 (*)    Leukocytes, UA TRACE (*)    Squamous Epithelial / LPF 0-5 (*)    All other components within normal limits  CULTURE, BLOOD (ROUTINE X 2)  CULTURE, BLOOD (ROUTINE X 2)  URINE CULTURE  RAPID INFLUENZA A&B ANTIGENS ([facility] ONLY)  LACTIC ACID, PLASMA  LACTIC ACID, PLASMA   ____________________________________________  EKG  I personally interpreted any EKGs ordered by me or triage  ____________________________________________  RADIOLOGY  I reviewed any imaging ordered by me or triage that were performed during my shift ____________________________________________   PROCEDURES  Procedure(s) performed: EJ placement, sterile preparation, with one stick was able to place a EJ in the patient's left neck with no complications it does flush well patient tolerated procedure well  Critical Care performed: CRITICAL CARE Performed by: Schuyler Amor   Total critical care time: 40 minutes  Critical care time was exclusive of separately billable procedures and treating other patients. Patient at risk for respiratory failure given hypoxia  Critical care was necessary to treat or prevent imminent or life-threatening deterioration.  Critical care was time spent personally by me on the following activities: development of treatment plan with patient and/or surrogate as well as nursing, discussions with consultants, evaluation of patient's response to treatment, examination of patient, obtaining history from patient or surrogate, ordering and performing treatments and  interventions, ordering and review of laboratory studies, ordering and review of radiographic studies, pulse oximetry and re-evaluation of patient's condition.   ____________________________________________   INITIAL IMPRESSION / ASSESSMENT AND PLAN / ED COURSE  Pertinent labs & imaging results that were available during my care of the patient were reviewed by me and considered in my medical decision making (see chart for details).  Patient with a fever, and hypoxia consistent for possible early pneumonia, urine and blood cultures have been obtained. We will give her broad-spectrum antibiotics. This is done with some reluctance given history of C. difficile but I think must be done. Urine culture is also been sent. There is no evidence of respiratory distress. She did complete dialysis she states, she does not appear to be fluid overloaded, and in any event that would not cause a fever. We will admit her to the hospital. Of note, dialysis needle still in place, we will withdrawal and hold pressure.   ____________________________________________   FINAL CLINICAL IMPRESSION(S) / ED DIAGNOSES  Final diagnoses:  Fever, unspecified fever cause  Hypoxia     Schuyler Amor, MD 08/31/15 1355  Schuyler Amor, MD 08/31/15 [place], MD 08/31/15 [phone]

## 2015-08-31 NOTE — H&P (Signed)
[facility] NAME: [name]    MR#:  [id number]  DATE OF BIRTH:  07/20/1961  DATE OF ADMISSION:  08/31/2015  PRIMARY CARE PHYSICIAN: Ellamae Sia, MD   REQUESTING/REFERRING PHYSICIAN: Schuyler Amor, MD  CHIEF COMPLAINT:   Chief Complaint  Patient presents with  . Fever   fever and cough today during hemodialysis.  HISTORY OF PRESENT ILLNESS:  [name]  is a 54 y.o. female with a known history of hypertension, diabetes and ESRD on hemodialysis. The patient has a recent medical history including pneumonia and C. difficile colitis. She got antibiotics for pneumonia 2 weeks ago and a recent fecal transplant for C. difficile colitis. Patient started to have fever and cough during hemodialysis today. She finished the hemodialysis and was sent to the ED for further evaluation. He complains of fever, chills and generalized weakness. She has nonproductive cough but denies any wheezing, orthopnea or nocturnal dyspnea no leg edema. She denies any dysuria or hematuria. Chest x-ray show pulmonary edema with a small pleural effusion.  PAST MEDICAL HISTORY:   Past Medical History  Diagnosis Date  . Asthma   . Collagen vascular disease ([place])   . Diabetes mellitus without complication ([place])   . Hypertension   . Coronary artery disease     a. cath 2013: stenting to RCA (report not available); b. cath 2014: LM nl, pLAD 40%, mLAD nl, ost LCx 40%, mid LCx nl, pRCA 30% @ site of prior stent, mRCA 50%  . Diabetic neuropathy ([place])   . ESRD (end stage renal disease) on dialysis ([place])     M-W-F  . GERD (gastroesophageal reflux disease)   . Hx of pancreatitis 2015  . Myocardial infarction ([place])   . Pneumonia   . Mitral regurgitation     a. echo 10/2013: EF 62%, noWMA, mildly dilated LA, mild to mod MR/TR, GR1DD  . COPD (chronic obstructive pulmonary disease) ([place])   . chronic diastolic CHF 123XX123  . dialysis 2006  . Depression   .  Anxiety   . Arthritis   . Headache   . Anemia     PAST SURGICAL HISTORY:   Past Surgical History  Procedure Laterality Date  . Cholecystectomy    . Appendectomy    . Abdominal hysterectomy    . Dialysis fistula creation Left     upper arm  . Esophagogastroduodenoscopy N/A 03/08/2015    Procedure: ESOPHAGOGASTRODUODENOSCOPY (EGD);  Surgeon: Manya Silvas, MD;  Location: [facility] ENDOSCOPY;  Service: Endoscopy;  Laterality: N/A;  . Cardiac catheterization Left 07/26/2015    Procedure: Left Heart Cath and Coronary Angiography;  Surgeon: Dionisio David, MD;  Location: [facility];  Service: Cardiovascular;  Laterality: Left;  [facility] Kitchen Eye surgery    . Fecal transplant N/A 08/23/2015    Procedure: FECAL TRANSPLANT;  Surgeon: Manya Silvas, MD;  Location: [facility] ENDOSCOPY;  Service: Endoscopy;  Laterality: N/A;    SOCIAL HISTORY:   Social History  Substance Use Topics  . Smoking status: Current Some Day Smoker -- 0.50 packs/day    Types: Cigarettes    Last Attempt to Quit: 02/13/2015  . Smokeless tobacco: Never Used  . Alcohol Use: No    FAMILY HISTORY:   Family History  Problem Relation Age of Onset  . Kidney disease Mother   . Diabetes Mother     DRUG ALLERGIES:   Allergies  Allergen Reactions  . Compazine [Prochlorperazine Edisylate] Anaphylaxis  and Nausea And Vomiting  . Ace Inhibitors Swelling  . Ativan [Lorazepam] Other (See Comments)    Reaction:  Hallucinations and headaches  . Codeine Nausea And Vomiting  . Gabapentin Other (See Comments)    Reaction:  Unknown   . Losartan Other (See Comments)    Reaction:  Unknown   . Ondansetron Other (See Comments)    Reaction:  Unknown   . Prochlorperazine Other (See Comments)    Reaction:  Unknown   . Scopolamine Other (See Comments)    Reaction:  Unknown   . Zofran [Ondansetron Hcl] Other (See Comments)    Reaction:  hallucinations   . Oxycodone Anxiety  . Tape Rash    REVIEW OF SYSTEMS:  CONSTITUTIONAL:  has fever, generalized weakness.  EYES: No blurred or double vision.  EARS, NOSE, AND THROAT: No tinnitus or ear pain.  RESPIRATORY: Has cough, shortness of breath, no wheezing or hemoptysis.  CARDIOVASCULAR: No chest pain, orthopnea, edema.  GASTROINTESTINAL: No nausea, vomiting, diarrhea or abdominal pain.  GENITOURINARY: No dysuria, hematuria.  ENDOCRINE: No polyuria, nocturia,  HEMATOLOGY: No anemia, easy bruising or bleeding SKIN: No rash or lesion. MUSCULOSKELETAL: No joint pain or arthritis.   NEUROLOGIC: No tingling, numbness, weakness.  PSYCHIATRY: No anxiety or depression.   MEDICATIONS AT HOME:   Prior to Admission medications   Medication Sig Start Date End Date Taking? Authorizing Provider  acidophilus (RISAQUAD) CAPS capsule Take 1 capsule by mouth daily. 07/25/15  Yes Bettey Costa, MD  albuterol (PROVENTIL) (2.5 MG/3ML) 0.083% nebulizer solution Take 2.5 mg by nebulization every 4 (four) hours as needed for wheezing or shortness of breath.   Yes Historical Provider, MD  amiodarone (PACERONE) 200 MG tablet Take 400 mg by mouth daily.   Yes Historical Provider, MD  amLODipine (NORVASC) 10 MG tablet Take 10 mg by mouth daily.    Yes Historical Provider, MD  aspirin EC 81 MG tablet Take 81 mg by mouth daily.   Yes Historical Provider, MD  atorvastatin (LIPITOR) 20 MG tablet Take 20 mg by mouth every evening.   Yes Historical Provider, MD  b complex-vitamin c-folic acid (NEPHRO-VITE) 0.8 MG TABS tablet Take 1 tablet by mouth daily.   Yes Historical Provider, MD  carvedilol (COREG) 25 MG tablet Take 25 mg by mouth 2 (two) times daily.   Yes Historical Provider, MD  cetirizine (ZYRTEC) 10 MG tablet Take 10 mg by mouth daily.   Yes Historical Provider, MD  Cholecalciferol (VITAMIN D3) 400 UNITS tablet Take 400 Units by mouth daily.   Yes Historical Provider, MD  citalopram (CELEXA) 20 MG tablet Take 20 mg by mouth daily.   Yes Historical Provider, MD  clopidogrel (PLAVIX) 75 MG  tablet Take 75 mg by mouth daily.   Yes Historical Provider, MD  glucose 4 GM chewable tablet Chew 3-4 tablets by mouth as needed for low blood sugar (if pts blood sugar is less than 60).   Yes Historical Provider, MD  HYDROcodone-acetaminophen (NORCO/VICODIN) 5-325 MG per tablet Take 1 tablet by mouth 2 (two) times daily as needed for severe pain.    Yes Historical Provider, MD  insulin aspart (NOVOLOG) 100 UNIT/ML injection Inject into the skin 3 (three) times daily before meals. Pt uses as needed per sliding scale.   Yes Historical Provider, MD  insulin detemir (LEVEMIR) 100 UNIT/ML injection Inject 6 Units into the skin at bedtime.   Yes Historical Provider, MD  lipase/protease/amylase (CREON) 12000 UNITS CPEP capsule Take 12,000 Units by mouth  3 (three) times daily with meals.    Yes Historical Provider, MD  meclizine (ANTIVERT) 25 MG tablet Take 25 mg by mouth 2 (two) times daily as needed for dizziness.   Yes Historical Provider, MD  metoCLOPramide (REGLAN) 5 MG tablet Take 1 tablet (5 mg total) by mouth 4 (four) times daily -  before meals and at bedtime. 02/24/15  Yes Srikar Sudini, MD  metoprolol (LOPRESSOR) 50 MG tablet Take 50 mg by mouth 2 (two) times daily.   Yes Historical Provider, MD  nitroGLYCERIN (NITROSTAT) 0.4 MG SL tablet Place 0.4 mg under the tongue every 5 (five) minutes x 3 doses as needed for chest pain. *Max 3 doses per episode*   Yes Historical Provider, MD  Omega-3 Fatty Acids (FISH OIL) 1000 MG CAPS Take 1,000 mg by mouth daily.   Yes Historical Provider, MD  ondansetron (ZOFRAN ODT) 4 MG disintegrating tablet Take 1 tablet (4 mg total) by mouth every 8 (eight) hours as needed for nausea or vomiting. 06/14/15  Yes Earleen Newport, MD  polyethylene glycol (MIRALAX / GLYCOLAX) packet Take 17 g by mouth daily as needed for mild constipation.   Yes Historical Provider, MD  pregabalin (LYRICA) 25 MG capsule Take 25 mg by mouth 2 (two) times daily as needed (for leg pain).    Yes Historical Provider, MD  promethazine (PHENERGAN) 25 MG suppository Place 1 suppository (25 mg total) rectally every 8 (eight) hours as needed for nausea or vomiting. 06/07/15  Yes Srikar Sudini, MD  ranitidine (ZANTAC) 150 MG tablet Take 150 mg by mouth daily.    Yes Historical Provider, MD  ranolazine (RANEXA) 1000 MG SR tablet Take 1,000 mg by mouth 2 (two) times daily.   Yes Historical Provider, MD  sevelamer carbonate (RENVELA) 800 MG tablet Take 1,600 mg by mouth 3 (three) times daily with meals.    Yes Historical Provider, MD  vitamin B-12 (CYANOCOBALAMIN) 1000 MCG tablet Take 1,000 mcg by mouth daily.   Yes Historical Provider, MD  albuterol (PROVENTIL HFA;VENTOLIN HFA) 108 (90 BASE) MCG/ACT inhaler Inhale 4-6 puffs by mouth every 4 hours as needed for wheezing, cough, and/or shortness of breath Patient not taking: Reported on 08/31/2015 02/17/15   Hinda Kehr, MD  docusate sodium (COLACE) 100 MG capsule Take 1 capsule (100 mg total) by mouth 2 (two) times daily. Patient not taking: Reported on 08/31/2015 06/07/15   [name], MD  guaiFENesin-dextromethorphan (ROBITUSSIN DM) 100-10 MG/5ML syrup Take 10 mLs by mouth every 6 (six) hours as needed for cough. Patient not taking: Reported on 08/31/2015 05/24/15   Gladstone Lighter, MD  metroNIDAZOLE (FLAGYL) 500 MG tablet Take 1 tablet (500 mg total) by mouth every 8 (eight) hours. Patient not taking: Reported on 08/31/2015 07/25/15   Bettey Costa, MD  prochlorperazine (COMPAZINE) 5 MG tablet Take 1 tablet (5 mg total) by mouth every 6 (six) hours as needed for nausea or vomiting. Patient not taking: Reported on 08/31/2015 06/14/15 06/13/16  Earleen Newport, MD  sucralfate (CARAFATE) 1 G tablet Take 1 tablet (1 g total) by mouth 4 (four) times daily -  with meals and at bedtime. Patient not taking: Reported on 08/31/2015 02/24/15   [name], MD      VITAL SIGNS:  Blood pressure 130/45, pulse 66, temperature 99.3 F (37.4 C), temperature  source Oral, resp. rate 22, height 5' 0.5" (1.537 m), weight 76.9 kg (169 lb 8.5 oz), SpO2 99 %.  PHYSICAL EXAMINATION:  GENERAL:  54 y.o.-year-old patient  lying in the bed with no acute distress.  EYES: Pupils equal, round, reactive to light and accommodation. No scleral icterus. Extraocular muscles intact.  HEENT: Head atraumatic, normocephalic. Oropharynx and nasopharynx clear.  NECK:  Supple, no jugular venous distention. No thyroid enlargement, no tenderness.  LUNGS: Normal breath sounds bilaterally, no wheezing, mild crackles. No use of accessory muscles of respiration.  CARDIOVASCULAR: S1, S2 normal. No murmurs, rubs, or gallops.  ABDOMEN: Soft, nontender, nondistended. Bowel sounds present. No organomegaly or mass.  EXTREMITIES: No pedal edema, cyanosis, or clubbing.  NEUROLOGIC: Cranial nerves II through XII are intact. Muscle strength 4/5 in all extremities. Sensation intact. Gait not checked.  PSYCHIATRIC: The patient is alert and oriented x 3.  SKIN: No obvious rash, lesion, or ulcer.   LABORATORY PANEL:   CBC  Recent Labs Lab 08/31/15 1156  WBC 5.2  HGB 10.9*  HCT 33.8*  PLT 107*   ------------------------------------------------------------------------------------------------------------------  Chemistries   Recent Labs Lab 08/31/15 1156  NA 137  K 3.6  CL 96*  CO2 29  GLUCOSE 77  BUN 7  CREATININE 2.19*  CALCIUM 8.0*  AST 22  ALT 15  ALKPHOS 192*  BILITOT 0.8   ------------------------------------------------------------------------------------------------------------------  Cardiac Enzymes No results for input(s): TROPONINI in the last 168 hours. ------------------------------------------------------------------------------------------------------------------  RADIOLOGY:  Dg Chest Portable 1 View  08/31/2015  CLINICAL DATA:  Shortness of breath. End-stage renal disease on hemodialysis. Fever. Healthcare associated pneumonia. EXAM: PORTABLE CHEST  1 VIEW COMPARISON:  07/23/2015 FINDINGS: Mild cardiomegaly stable. Diffuse interstitial prominence is suspicious for mild interstitial edema but shows mild improvement since previous study. Small bilateral pleural effusions and bibasilar atelectasis also show mild improvement. IMPRESSION: Mild improvement in diffuse interstitial edema and bilateral pleural effusions. Electronically Signed   By: Earle Gell M.D.   On: 08/31/2015 12:19    EKG:   Orders placed or performed during the hospital encounter of 08/31/15  . EKG 12-Lead  . EKG 12-Lead    IMPRESSION AND PLAN:   Pneumonia. Possible HAP I will start Zosyn and vancomycin, follow-up CBC, blood culture and sputum culture. Give nebulizer when necessary.  Sepsis. Give antibiotics as mentioned above.  UTI. Continue antibiotics as mentioned above and follow-up blood culture and urine culture.  Thrombocytopenia. Follow-up CBC. May need to discontinue heparin subcutaneous if worsening. ESRD on hemodialysis. Nephrology consult and to continue hemodialysis. Hypertension. Continue hypertension medication. Diabetes. Start sliding scale. COPD. Stable. Nebulizer when necessary.   All the records are reviewed and case discussed with ED provider. Management plans discussed with the patient, family and they are in agreement.  CODE STATUS: Full code  TOTAL TIME TAKING CARE OF THIS PATIENT: 56 minutes.    Demetrios Loll M.D on 08/31/2015 at 5:08 PM  Between 7am to 6pm - Pager - [phone]  After 6pm go to [email or web address] - password [email or web address] [facility]  Office  [phone]  CC: Primary care physician; Ellamae Sia, MD

## 2015-08-31 NOTE — Progress Notes (Signed)
ANTIBIOTIC CONSULT NOTE - INITIAL  Pharmacy Consult for vancomycin Indication: pneumonia  Allergies  Allergen Reactions  . Compazine [Prochlorperazine Edisylate] Anaphylaxis and Nausea And Vomiting  . Ace Inhibitors Swelling  . Ativan [Lorazepam] Other (See Comments)    Reaction:  Hallucinations and headaches  . Codeine Nausea And Vomiting  . Gabapentin Other (See Comments)    Reaction:  Unknown   . Losartan Other (See Comments)    Reaction:  Unknown   . Ondansetron Other (See Comments)    Reaction:  Unknown   . Prochlorperazine Other (See Comments)    Reaction:  Unknown   . Scopolamine Other (See Comments)    Reaction:  Unknown   . Zofran [Ondansetron Hcl] Other (See Comments)    Reaction:  hallucinations   . Oxycodone Anxiety  . Tape Rash   Patient Measurements: Height: 5' 0.5" (153.7 cm) Weight: 169 lb 8.5 oz (76.9 kg) IBW/kg (Calculated) : 46.65 Adjusted Body Weight: 58.7 kg  Vital Signs: Temp: 99.5 F (37.5 C) (12/09 1449) Temp Source: Oral (12/09 1449) BP: 132/50 mmHg (12/09 1500) Pulse Rate: 69 (12/09 1500) Intake/Output from previous day:   Intake/Output from this shift:    Labs:  Recent Labs  08/31/15 1156  WBC 5.2  HGB 10.9*  PLT 107*  CREATININE 2.19*   Estimated Creatinine Clearance: 26.6 mL/min (by C-G formula based on Cr of 2.19). No results for input(s): VANCOTROUGH, VANCOPEAK, VANCORANDOM, GENTTROUGH, GENTPEAK, GENTRANDOM, TOBRATROUGH, TOBRAPEAK, TOBRARND, AMIKACINPEAK, AMIKACINTROU, AMIKACIN in the last 72 hours.   Microbiology: No results found for this or any previous visit (from the past 720 hour(s)).  Medical History: Past Medical History  Diagnosis Date  . Asthma   . Collagen vascular disease ([place])   . Diabetes mellitus without complication ([place])   . Hypertension   . Coronary artery disease     a. cath 2013: stenting to RCA (report not available); b. cath 2014: LM nl, pLAD 40%, mLAD nl, ost LCx 40%, mid LCx nl, pRCA 30% @ site  of prior stent, mRCA 50%  . Diabetic neuropathy ([place])   . ESRD (end stage renal disease) on dialysis ([place])     M-W-F  . GERD (gastroesophageal reflux disease)   . Hx of pancreatitis 2015  . Myocardial infarction ([place])   . Pneumonia   . Mitral regurgitation     a. echo 10/2013: EF 62%, noWMA, mildly dilated LA, mild to mod MR/TR, GR1DD  . COPD (chronic obstructive pulmonary disease) ([place])   . chronic diastolic CHF 123XX123  . dialysis 2006  . Depression   . Anxiety   . Arthritis   . Headache   . Anemia     Medications:  Anti-infectives    Start     Dose/Rate Route Frequency Ordered Stop   09/03/15 1200  vancomycin (VANCOCIN) 500 mg in sodium chloride 0.9 % 100 mL IVPB     500 mg 100 mL/hr over 60 Minutes Intravenous Every M-W-F (Hemodialysis) 08/31/15 1545     08/31/15 2200  piperacillin-tazobactam (ZOSYN) IVPB 3.375 g     3.375 g 12.5 mL/hr over 240 Minutes Intravenous Every 12 hours 08/31/15 1405     08/31/15 1630  vancomycin (VANCOCIN) 1,250 mg in sodium chloride 0.9 % 250 mL IVPB     1,250 mg 166.7 mL/hr over 90 Minutes Intravenous  Once 08/31/15 1542     08/31/15 1430  piperacillin-tazobactam (ZOSYN) IVPB 3.375 g     3.375 g 100 mL/hr over 30 Minutes Intravenous  Once 08/31/15  1401 08/31/15 1508     Assessment: Pharmacy consulted to dose vancomycin in this 54 year old female being treated for pneumonia. Patient has a fever of 101.9 in ED. Patient receives HD on MWF. Patient also has a history of C. Difficile colitis and has undergone FMT in the past.  Adjusted body weight = 58.7 kg  Goal of Therapy:  Vancomycin trough level 15-20 mcg/ml  Plan:  Measure antibiotic drug levels at steady state Follow up culture results  Vancomycin loading dose of 1250 mg (~21 mg/kg) Vancomycin 500 mg IV to be given after HD on MWF - pharmacy will follow up with HD schedule and vancomycin trough Follow cultures for possibility to de-escalate as soon as possible given history of C.  Difficile.  Pharmacy will continue to monitor, thank you for the consult.  [name] 08/31/2015,3:46 PM

## 2015-08-31 NOTE — ED Notes (Signed)
Took patient off oxygen per Dr. Andre Lefort instructions.

## 2015-09-01 ENCOUNTER — Inpatient Hospital Stay: Payer: Medicare Other

## 2015-09-01 LAB — GLUCOSE, CAPILLARY: Glucose-Capillary: 219 mg/dL — ABNORMAL HIGH (ref 65–99)

## 2015-09-01 LAB — EXPECTORATED SPUTUM ASSESSMENT W GRAM STAIN, RFLX TO RESP C

## 2015-09-01 MED ORDER — GUAIFENESIN 100 MG/5ML PO SOLN
5.0000 mL | ORAL | Status: DC | PRN
  Administered 2015-09-01 – 2015-09-03 (×4): 100 mg via ORAL
  Filled 2015-09-01 (×4): qty 10

## 2015-09-01 MED ORDER — FUROSEMIDE 10 MG/ML IJ SOLN
40.0000 mg | Freq: Once | INTRAMUSCULAR | Status: AC
  Administered 2015-09-01: 40 mg via INTRAVENOUS
  Filled 2015-09-01: qty 4

## 2015-09-01 MED ORDER — BENZONATATE 100 MG PO CAPS
100.0000 mg | ORAL_CAPSULE | Freq: Three times a day (TID) | ORAL | Status: DC | PRN
Start: 2015-09-01 — End: 2015-09-03
  Administered 2015-09-01: 17:00:00 100 mg via ORAL
  Filled 2015-09-01: qty 1

## 2015-09-01 MED ORDER — IPRATROPIUM-ALBUTEROL 0.5-2.5 (3) MG/3ML IN SOLN
3.0000 mL | Freq: Four times a day (QID) | RESPIRATORY_TRACT | Status: DC
  Administered 2015-09-01 – 2015-09-02 (×4): 3 mL via RESPIRATORY_TRACT
  Filled 2015-09-01 (×4): qty 3

## 2015-09-01 MED ORDER — METRONIDAZOLE 500 MG PO TABS
500.0000 mg | ORAL_TABLET | Freq: Three times a day (TID) | ORAL | Status: DC
  Administered 2015-09-01 – 2015-09-03 (×8): 500 mg via ORAL
  Filled 2015-09-01 (×8): qty 1

## 2015-09-01 MED ORDER — METHYLPREDNISOLONE SODIUM SUCC 40 MG IJ SOLR
40.0000 mg | Freq: Two times a day (BID) | INTRAMUSCULAR | Status: DC
  Administered 2015-09-01 – 2015-09-03 (×5): 40 mg via INTRAVENOUS
  Filled 2015-09-01 (×5): qty 1

## 2015-09-01 MED ORDER — BUDESONIDE 0.25 MG/2ML IN SUSP
0.2500 mg | Freq: Two times a day (BID) | RESPIRATORY_TRACT | Status: DC
  Administered 2015-09-01 – 2015-09-02 (×3): 0.25 mg via RESPIRATORY_TRACT
  Filled 2015-09-01 (×3): qty 2

## 2015-09-01 NOTE — Plan of Care (Signed)
Problem: Health Behavior/Discharge Planning: Goal: Ability to manage health-related needs will improve Outcome: Not Progressing Pt refused insulin  Problem: Pain Managment: Goal: General experience of comfort will improve Outcome: Progressing Denies pain

## 2015-09-01 NOTE — Progress Notes (Signed)
Problem: Health Behavior/Discharge Planning: Goal: Ability to manage health-related needs will improve Outcome: Progressing Pt took all medications this shift. Pt requested pain medication for cough   Problem: Pain Managment: Goal: General experience of comfort will improve Outcome: Progressing Pain medication x1 for headache with relief.

## 2015-09-01 NOTE — Progress Notes (Signed)
Initial Nutrition Assessment     INTERVENTION:   Meals and Snacks: Cater to patient preferences; pt would likely benefit from smaller, more frequent meals Medical Food Supplement Therapy: pt declines nutritional supplements due to report that cause her diarrhea  NUTRITION DIAGNOSIS:   Inadequate oral intake related to chronic illness, altered GI function as evidenced by per patient/family report.  GOAL:   Patient will meet greater than or equal to 90% of their needs  MONITOR:    (Energy Intake, Digestive System, Anthropometrics, Electrolyte/Renal Profile, Glucose Profile)  REASON FOR ASSESSMENT:   Malnutrition Screening Tool    ASSESSMENT:    Pt admitted with pneumonia, sepsis with UTI  Past Medical History  Diagnosis Date  . Asthma   . Collagen vascular disease ([place])   . Diabetes mellitus without complication ([place])   . Hypertension   . Coronary artery disease     a. cath 2013: stenting to RCA (report not available); b. cath 2014: LM nl, pLAD 40%, mLAD nl, ost LCx 40%, mid LCx nl, pRCA 30% @ site of prior stent, mRCA 50%  . Diabetic neuropathy ([place])   . ESRD (end stage renal disease) on dialysis ([place])     M-W-F  . GERD (gastroesophageal reflux disease)   . Hx of pancreatitis 2015  . Myocardial infarction ([place])   . Pneumonia   . Mitral regurgitation     a. echo 10/2013: EF 62%, noWMA, mildly dilated LA, mild to mod MR/TR, GR1DD  . COPD (chronic obstructive pulmonary disease) ([place])   . chronic diastolic CHF 123XX123  . dialysis 2006  . Depression   . Anxiety   . Arthritis   . Headache   . Anemia      Diet Order:  Diet renal/carb modified with fluid restriction Diet-HS Snack?: Nothing; Room service appropriate?: Yes; Fluid consistency:: Thin   Energy Intake: pt ate 50% at dinner last night   Electrolyte and Renal Profile:  Recent Labs Lab 08/31/15 1156  BUN 7  CREATININE 2.19*  NA 137  K 3.6   Glucose Profile:   Recent Labs  08/31/15 1714  08/31/15 1847  GLUCAP 56* 91   Digestive System: recurrent Cdiff colitis with recent fecal transplant by MD Tiffany Kocher (08/23/15) per MD notes, pt with hx of N/V/D/pain with hx of gastroparesis, chronic pancreatitis  Meds: acidophilus, lasix, levemir, reglan, solumedrol, creon, MVI, renvela  Nutrition Focused Physical Exam:  Unable to complete Nutrition-Focused physical exam at this time.    Height:   Ht Readings from Last 1 Encounters:  08/31/15 5' 0.5" (1.537 m)    Weight: 7.7% wt loss in about 3 months or so  Wt Readings from Last 1 Encounters:  08/31/15 169 lb 8.5 oz (76.9 kg)   Filed Weights   08/31/15 1111  Weight: 169 lb 8.5 oz (76.9 kg)    Wt Readings from Last 10 Encounters:  08/31/15 169 lb 8.5 oz (76.9 kg)  08/23/15 172 lb (78.019 kg)  07/25/15 171 lb 11.8 oz (77.9 kg)  07/20/15 174 lb (78.926 kg)  06/18/15 175 lb (79.379 kg)  06/16/15 175 lb (79.379 kg)  06/15/15 175 lb (79.379 kg)  06/14/15 175 lb (79.379 kg)  06/06/15 179 lb 7.3 oz (81.4 kg)  05/23/15 183 lb 6.8 oz (83.2 kg)    BMI:  Body mass index is 32.55 kg/(m^2).  Estimated Nutritional Needs:   Kcal:  1570-1701 kcals (BEE 1007, 1.3 AF, 1.2-1.3 IF) using IBW 48 kg  Protein:  58-72 g (1.2-1.5 g/kg)  Fluid:  1000 mL plus UOP  MODERATE Care Level  [facility] MS, [place], LDN [phone] Pager

## 2015-09-01 NOTE — Progress Notes (Signed)
Assumed care of patient from Lang Snow, [place] at [phone]. Madlyn Frankel, RN

## 2015-09-01 NOTE — Progress Notes (Signed)
[facility]  ROUNDING NOTE   Subjective:   States she underwent HD yesterday. Tolerated well.  Recent fecal transplant on 12/1 by Dr. Vira Agar No acute c/o today  Objective:  Vital signs in last 24 hours:  Temp:  [97.9 F (36.6 C)-100.9 F (38.3 C)] 97.9 F (36.6 C) (12/10 0521) Pulse Rate:  [54-78] 59 (12/10 0925) Resp:  [9-26] 18 (12/10 0521) BP: (113-141)/(44-66) 113/57 mmHg (12/10 0925) SpO2:  [87 %-100 %] 98 % (12/10 0925)  Weight change:  Filed Weights   08/31/15 1111  Weight: 76.9 kg (169 lb 8.5 oz)    Intake/Output: I/O last 3 completed shifts: In: 540 [P.O.:240; IV Piggyback:300] Out: 1 [Stool:1]   Intake/Output this shift:     Physical Exam: General: NAD, sick appearing  Head: Normocephalic, atraumatic. Moist oral mucosal membranes  Eyes: Anicteric, PERRL  Neck: Supple, trachea midline  Lungs:  Clear to auscultation  Heart: Regular rate and rhythm  Abdomen:  Soft, nontender,   Extremities: no peripheral edema.  Neurologic: Nonfocal, moving all four extremities  Skin: No lesions  Access: Left arm AVF    Basic Metabolic Panel:  Recent Labs Lab 08/31/15 1156  NA 137  K 3.6  CL 96*  CO2 29  GLUCOSE 77  BUN 7  CREATININE 2.19*  CALCIUM 8.0*    Liver Function Tests:  Recent Labs Lab 08/31/15 1156  AST 22  ALT 15  ALKPHOS 192*  BILITOT 0.8  PROT 7.2  ALBUMIN 2.9*   No results for input(s): LIPASE, AMYLASE in the last 168 hours. No results for input(s): AMMONIA in the last 168 hours.  CBC:  Recent Labs Lab 08/31/15 1156  WBC 5.2  NEUTROABS 4.0  HGB 10.9*  HCT 33.8*  MCV 84.1  PLT 107*    Cardiac Enzymes: No results for input(s): CKTOTAL, CKMB, CKMBINDEX, TROPONINI in the last 168 hours.  BNP: Invalid input(s): POCBNP  CBG:  Recent Labs Lab 08/31/15 1714 08/31/15 1847  GLUCAP 47* 50    Microbiology: Results for orders placed or performed during the hospital encounter of 08/31/15  Culture, blood  (routine x 2)     Status: None (Preliminary result)   Collection Time: 08/31/15 11:56 AM  Result Value Ref Range Status   Specimen Description BLOOD RIGHT AC  Final   Special Requests BOTTLES DRAWN AEROBIC AND ANAEROBIC  1CC  Final   Culture NO GROWTH < 24 HOURS  Final   Report Status PENDING  Incomplete  Culture, blood (routine x 2)     Status: None (Preliminary result)   Collection Time: 08/31/15 12:03 PM  Result Value Ref Range Status   Specimen Description BLOOD LEFT EJ  Final   Special Requests   Final    BOTTLES DRAWN AEROBIC AND ANAEROBIC  AER 2CC ANA 4CC   Culture NO GROWTH < 24 HOURS  Final   Report Status PENDING  Incomplete  Urine culture     Status: None (Preliminary result)   Collection Time: 08/31/15 12:40 PM  Result Value Ref Range Status   Specimen Description URINE, RANDOM  Final   Special Requests NONE  Final   Culture   Final    50,000 COLONIES/mL GRAM POSITIVE COCCI IDENTIFICATION AND SUSCEPTIBILITIES TO FOLLOW    Report Status PENDING  Incomplete  Culture, sputum-assessment     Status: None   Collection Time: 09/01/15  9:30 AM  Result Value Ref Range Status   Specimen Description EXPECTORATED SPUTUM  Final   Special Requests NONE  Final   Sputum evaluation THIS SPECIMEN IS ACCEPTABLE FOR SPUTUM CULTURE  Final   Report Status 09/01/2015 FINAL  Final    Coagulation Studies: No results for input(s): LABPROT, INR in the last 72 hours.  Urinalysis:  Recent Labs  08/31/15 1240  COLORURINE YELLOW*  LABSPEC 1.009  PHURINE 9.0*  GLUCOSEU NEGATIVE  HGBUR NEGATIVE  BILIRUBINUR NEGATIVE  KETONESUR NEGATIVE  PROTEINUR 100*  NITRITE NEGATIVE  LEUKOCYTESUR TRACE*      Imaging: Dg Chest 2 View  09/01/2015  CLINICAL DATA:  Increasing short of breath and cough EXAM: CHEST  2 VIEW COMPARISON:  Radiograph 08/31/2015 FINDINGS: Normal cardiac silhouette. Bilateral small effusions. Mild interstitial lung markings. LEFT vascular stent noted. IMPRESSION: Mild  interstitial edema and bilateral pleural effusions not changed from prior. No overt pulmonary edema. Electronically Signed   By: Suzy Bouchard M.D.   On: 09/01/2015 11:26   Dg Chest Portable 1 View  08/31/2015  CLINICAL DATA:  Shortness of breath. End-stage renal disease on hemodialysis. Fever. Healthcare associated pneumonia. EXAM: PORTABLE CHEST 1 VIEW COMPARISON:  07/23/2015 FINDINGS: Mild cardiomegaly stable. Diffuse interstitial prominence is suspicious for mild interstitial edema but shows mild improvement since previous study. Small bilateral pleural effusions and bibasilar atelectasis also show mild improvement. IMPRESSION: Mild improvement in diffuse interstitial edema and bilateral pleural effusions. Electronically Signed   By: Earle Gell M.D.   On: 08/31/2015 12:19     Medications:     . acidophilus  1 capsule Oral Daily  . amiodarone  400 mg Oral Daily  . amLODipine  10 mg Oral Daily  . aspirin EC  81 mg Oral Daily  . atorvastatin  20 mg Oral QPM  . budesonide (PULMICORT) nebulizer solution  0.25 mg Nebulization BID  . carvedilol  25 mg Oral BID  . cholecalciferol  400 Units Oral Daily  . citalopram  20 mg Oral Daily  . clopidogrel  75 mg Oral Daily  . famotidine  20 mg Oral Daily  . heparin  5,000 Units Subcutaneous 3 times per day  . insulin detemir  6 Units Subcutaneous QHS  . ipratropium-albuterol  3 mL Nebulization Q6H  . lipase/protease/amylase  12,000 Units Oral TID WC  . loratadine  10 mg Oral Daily  . methylPREDNISolone (SOLU-MEDROL) injection  40 mg Intravenous Q12H  . metoCLOPramide  5 mg Oral TID AC & HS  . metoprolol  50 mg Oral BID  . metroNIDAZOLE  500 mg Oral 3 times per day  . multivitamin  1 tablet Oral QHS  . piperacillin-tazobactam (ZOSYN)  IV  3.375 g Intravenous Q12H  . ranolazine  1,000 mg Oral BID  . sevelamer carbonate  1,600 mg Oral TID WC  . sodium chloride  3 mL Intravenous Q12H  . [START ON 09/03/2015] vancomycin  500 mg Intravenous Q  M,W,F-HD  . vitamin B-12  1,000 mcg Oral Daily   sodium chloride, albuterol, HYDROcodone-acetaminophen, meclizine, nitroGLYCERIN, polyethylene glycol, pregabalin, sodium chloride  Assessment/ Plan:  [name] is a 54 y.o. 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  Patient admitted on 08/31/2015   [facility] MWF [place]  1. End Stage Renal Disease: completed treatment today. No acute indication for dialysis. Continue MWF schedule. Monitor daily for dialysis need.   2. Sepsis/pneumonia:  Currently treated with zosyn and vancomycin.  3. Hypertension: blood pressure at goal.  - Continue home regimen  4. Anemia of chronic kidney disease: hemoglobin 10.9 - gets micera as outpatient.  5. Secondary Hyperparathyroidism: outpatient phosphorus, calcium and PTH at goal.  - currently not on binders.     LOS: 1 Artis Buechele 12/10/201612:52 PM

## 2015-09-01 NOTE — Progress Notes (Signed)
Patient ID: [name], female   DOB: 11/16/1960, 54 y.o.   MRN: [id number] [facility] PROGRESS NOTE  PCP: Ellamae Sia, MD  HPI/Subjective: Patient with cough and shortness of breath. She does not feel well. Patient also had high fever.  Objective: Filed Vitals:   09/01/15 0521 09/01/15 0925  BP: 114/45 113/57  Pulse: 54 59  Temp: 97.9 F (36.6 C)   Resp: 18     Filed Weights   08/31/15 1111  Weight: 76.9 kg (169 lb 8.5 oz)    ROS: Review of Systems  Constitutional: Negative for fever and chills.  Eyes: Negative for blurred vision.  Respiratory: Positive for cough, shortness of breath and wheezing.   Cardiovascular: Negative for chest pain.  Gastrointestinal: Negative for nausea, vomiting, abdominal pain, diarrhea and constipation.  Genitourinary: Negative for dysuria.  Musculoskeletal: Negative for joint pain.  Neurological: Negative for dizziness and headaches.   Exam: Physical Exam  Constitutional: She is oriented to person, place, and time.  HENT:  Nose: No mucosal edema.  Mouth/Throat: No oropharyngeal exudate or posterior oropharyngeal edema.  Eyes: Conjunctivae, EOM and lids are normal. Pupils are equal, round, and reactive to light.  Neck: No JVD present. Carotid bruit is not present. No edema present. No thyroid mass and no thyromegaly present.  Cardiovascular: S1 normal and S2 normal.  Exam reveals no gallop.   No murmur heard. Pulses:      Dorsalis pedis pulses are 2+ on the right side, and 2+ on the left side.  Respiratory: No respiratory distress. She has wheezes in the right middle field and the left middle field. She has rhonchi in the right lower field and the left lower field. She has no rales.  GI: Soft. Bowel sounds are normal. There is no tenderness.  Musculoskeletal:       Right ankle: She exhibits swelling.       Left ankle: She exhibits swelling.  Lymphadenopathy:    She has no cervical adenopathy.  Neurological: She is  alert and oriented to person, place, and time. No cranial nerve deficit.  Skin: Skin is warm. No rash noted. Nails show no clubbing.  Psychiatric: She has a normal mood and affect.    Data Reviewed: Basic Metabolic Panel:  Recent Labs Lab 08/31/15 1156  NA 137  K 3.6  CL 96*  CO2 29  GLUCOSE 77  BUN 7  CREATININE 2.19*  CALCIUM 8.0*   Liver Function Tests:  Recent Labs Lab 08/31/15 1156  AST 22  ALT 15  ALKPHOS 192*  BILITOT 0.8  PROT 7.2  ALBUMIN 2.9*   CBC:  Recent Labs Lab 08/31/15 1156  WBC 5.2  NEUTROABS 4.0  HGB 10.9*  HCT 33.8*  MCV 84.1  PLT 107*   BNP (last 3 results)  Recent Labs  02/08/15 1151 02/17/15 1955  BNP 682.0* 729.0*     CBG:  Recent Labs Lab 08/31/15 1714 08/31/15 1847  GLUCAP 56* 91    Recent Results (from the past 240 hour(s))  Culture, blood (routine x 2)     Status: None (Preliminary result)   Collection Time: 08/31/15 11:56 AM  Result Value Ref Range Status   Specimen Description BLOOD RIGHT AC  Final   Special Requests BOTTLES DRAWN AEROBIC AND ANAEROBIC  1CC  Final   Culture NO GROWTH < 24 HOURS  Final   Report Status PENDING  Incomplete  Culture, blood (routine x 2)     Status: None (Preliminary result)  Collection Time: 08/31/15 12:03 PM  Result Value Ref Range Status   Specimen Description BLOOD LEFT EJ  Final   Special Requests   Final    BOTTLES DRAWN AEROBIC AND ANAEROBIC  AER 2CC ANA 4CC   Culture NO GROWTH < 24 HOURS  Final   Report Status PENDING  Incomplete  Urine culture     Status: None (Preliminary result)   Collection Time: 08/31/15 12:40 PM  Result Value Ref Range Status   Specimen Description URINE, RANDOM  Final   Special Requests NONE  Final   Culture   Final    50,000 COLONIES/mL GRAM POSITIVE COCCI IDENTIFICATION AND SUSCEPTIBILITIES TO FOLLOW    Report Status PENDING  Incomplete  Culture, sputum-assessment     Status: None   Collection Time: 09/01/15  9:30 AM  Result Value Ref  Range Status   Specimen Description EXPECTORATED SPUTUM  Final   Special Requests NONE  Final   Sputum evaluation THIS SPECIMEN IS ACCEPTABLE FOR SPUTUM CULTURE  Final   Report Status 09/01/2015 FINAL  Final     Studies: Dg Chest 2 View  09/01/2015  CLINICAL DATA:  Increasing short of breath and cough EXAM: CHEST  2 VIEW COMPARISON:  Radiograph 08/31/2015 FINDINGS: Normal cardiac silhouette. Bilateral small effusions. Mild interstitial lung markings. LEFT vascular stent noted. IMPRESSION: Mild interstitial edema and bilateral pleural effusions not changed from prior. No overt pulmonary edema. Electronically Signed   By: Suzy Bouchard M.D.   On: 09/01/2015 11:26   Dg Chest Portable 1 View  08/31/2015  CLINICAL DATA:  Shortness of breath. End-stage renal disease on hemodialysis. Fever. Healthcare associated pneumonia. EXAM: PORTABLE CHEST 1 VIEW COMPARISON:  07/23/2015 FINDINGS: Mild cardiomegaly stable. Diffuse interstitial prominence is suspicious for mild interstitial edema but shows mild improvement since previous study. Small bilateral pleural effusions and bibasilar atelectasis also show mild improvement. IMPRESSION: Mild improvement in diffuse interstitial edema and bilateral pleural effusions. Electronically Signed   By: Earle Gell M.D.   On: 08/31/2015 12:19    Scheduled Meds: . acidophilus  1 capsule Oral Daily  . amiodarone  400 mg Oral Daily  . amLODipine  10 mg Oral Daily  . aspirin EC  81 mg Oral Daily  . atorvastatin  20 mg Oral QPM  . budesonide (PULMICORT) nebulizer solution  0.25 mg Nebulization BID  . carvedilol  25 mg Oral BID  . cholecalciferol  400 Units Oral Daily  . citalopram  20 mg Oral Daily  . clopidogrel  75 mg Oral Daily  . famotidine  20 mg Oral Daily  . heparin  5,000 Units Subcutaneous 3 times per day  . insulin detemir  6 Units Subcutaneous QHS  . ipratropium-albuterol  3 mL Nebulization Q6H  . lipase/protease/amylase  12,000 Units Oral TID WC  .  loratadine  10 mg Oral Daily  . methylPREDNISolone (SOLU-MEDROL) injection  40 mg Intravenous Q12H  . metoCLOPramide  5 mg Oral TID AC & HS  . metoprolol  50 mg Oral BID  . metroNIDAZOLE  500 mg Oral 3 times per day  . multivitamin  1 tablet Oral QHS  . piperacillin-tazobactam (ZOSYN)  IV  3.375 g Intravenous Q12H  . ranolazine  1,000 mg Oral BID  . sevelamer carbonate  1,600 mg Oral TID WC  . sodium chloride  3 mL Intravenous Q12H  . [START ON 09/03/2015] vancomycin  500 mg Intravenous Q M,W,F-HD  . vitamin B-12  1,000 mcg Oral Daily    Assessment/Plan:  1. Clinical sepsis, suspected pneumonia, acute cystitis without hematuria. So far blood cultures are negative. Awaiting sputum culture. Urine culture growing gram-positive cocci. Patient is on Zosyn and vancomycin. Chest x-ray read by radiologist commented on fluid rather than pneumonia. Steroids added. 2. End-stage renal disease- dialysis to help out with fluid management 3. Thrombocytopenia- could be secondary to infection watch closely. 4. Type 2 diabetes low-dose Lantus and sliding scale 5. Chronic diastolic congestive heart failure- dialysis to help out with fluid. I did give 1 dose of Lasix today. 6. Anxiety and depression continue psychiatric medications 7. Anemia of chronic disease 8. Essential hypertension continue usual medications 9. Recent C. difficile colitis- empiric oral Flagyl while on antibiotics.  Code Status:     Code Status Orders        Start     Ordered   08/31/15 1600  Full code   Continuous     08/31/15 1600    Advance Directive Documentation        Most Recent Value   Type of Advance Directive  Healthcare Power of Attorney   Pre-existing out of facility DNR order (yellow form or pink MOST form)     "MOST" Form in Place?       Disposition Plan: To be determined  Consultants:  Nephrology  Antibiotics:  Oral Flagyl  Vancomycin IV  Zosyn IV  Time spent: 25 minutes  Loletha Grayer  [facility]

## 2015-09-02 LAB — CBC
HCT: 33 % — ABNORMAL LOW (ref 35.0–47.0)
Hemoglobin: 10.7 g/dL — ABNORMAL LOW (ref 12.0–16.0)
MCH: 27.2 pg (ref 26.0–34.0)
MCHC: 32.6 g/dL (ref 32.0–36.0)
MCV: 83.6 fL (ref 80.0–100.0)
Platelets: 103 10*3/uL — ABNORMAL LOW (ref 150–440)
RBC: 3.94 MIL/uL (ref 3.80–5.20)
RDW: 19.7 % — ABNORMAL HIGH (ref 11.5–14.5)
WBC: 4.7 10*3/uL (ref 3.6–11.0)

## 2015-09-02 LAB — URINE CULTURE: Culture: 50000

## 2015-09-02 LAB — GLUCOSE, CAPILLARY: Glucose-Capillary: 386 mg/dL — ABNORMAL HIGH (ref 65–99)

## 2015-09-02 MED ORDER — PNEUMOCOCCAL VAC POLYVALENT 25 MCG/0.5ML IJ INJ
0.5000 mL | INJECTION | INTRAMUSCULAR | Status: AC
  Administered 2015-09-03: 0.5 mL via INTRAMUSCULAR
  Filled 2015-09-02: qty 0.5

## 2015-09-02 MED ORDER — IPRATROPIUM-ALBUTEROL 0.5-2.5 (3) MG/3ML IN SOLN: 3.0000 mL | Freq: Four times a day (QID) | RESPIRATORY_TRACT | Status: DC | PRN

## 2015-09-02 MED ORDER — INSULIN ASPART 100 UNIT/ML ~~LOC~~ SOLN
0.0000 [IU] | Freq: Three times a day (TID) | SUBCUTANEOUS | Status: DC
  Administered 2015-09-02: 22:00:00 9 [IU] via SUBCUTANEOUS
  Administered 2015-09-03: 2 [IU] via SUBCUTANEOUS
  Administered 2015-09-03: 1 [IU] via SUBCUTANEOUS
  Administered 2015-09-03: 2 [IU] via SUBCUTANEOUS
  Filled 2015-09-02: qty 9
  Filled 2015-09-02: qty 2
  Filled 2015-09-02: qty 1
  Filled 2015-09-02: qty 2

## 2015-09-02 NOTE — Plan of Care (Signed)
Problem: Skin Integrity: Goal: Risk for impaired skin integrity will decrease Outcome: Completed/Met Date Met:  09/02/15 No skin issues  Problem: Bowel/Gastric: Goal: Will not experience complications related to bowel motility Outcome: Progressing Pt verbalizes treatment plan, compliant, willing to proceed. Reports no pain unless coughs/ intermittent chest wall pain with cough with cough prn given x 1 with partial benefit. No pain meds required. Up to [facility] with 1+ with generalized weakness with small formed brown stool. Contact and droplet precautions continued. Appetite good with meals tolerated.  Problem: Spiritual Needs Goal: Ability to function at adequate level Outcome: Completed/Met Date Met:  09/02/15 Verbalizes knowledge of resoureces; needs met. No spiritual distress.

## 2015-09-02 NOTE — Plan of Care (Signed)
Problem: Pain Managment: Goal: General experience of comfort will improve Outcome: Progressing Pt does not c/o pain at this time. Continue ot assess. Remains afebrile at this time.   Problem: Activity: Goal: Risk for activity intolerance will decrease Outcome: Progressing Pt is a 1 assist to the [facility]. High Fall Risk, encouraged to call for assistance.

## 2015-09-02 NOTE — Progress Notes (Signed)
Patient ID: [name], female   DOB: June 14, 1961, 54 y.o.   MRN: [id number] [facility] PROGRESS NOTE  PCP: Ellamae Sia, MD  HPI/Subjective: Patient feeling a lot better than when she came in. Still with some cough and shortness of breath. Tolerating diet.  Objective: Filed Vitals:   09/02/15 0948 09/02/15 1412  BP: 122/49 129/51  Pulse: 60 57  Temp: 97.9 F (36.6 C) 98.6 F (37 C)  Resp: 20 18    Filed Weights   08/31/15 1111  Weight: 76.9 kg (169 lb 8.5 oz)    ROS: Review of Systems  Constitutional: Negative for fever and chills.  Eyes: Negative for blurred vision.  Respiratory: Positive for cough, shortness of breath and wheezing.   Cardiovascular: Negative for chest pain.  Gastrointestinal: Negative for nausea, vomiting, abdominal pain, diarrhea and constipation.  Genitourinary: Negative for dysuria.  Musculoskeletal: Negative for joint pain.  Neurological: Negative for dizziness and headaches.   Exam: Physical Exam  Constitutional: She is oriented to person, place, and time.  HENT:  Nose: No mucosal edema.  Mouth/Throat: No oropharyngeal exudate or posterior oropharyngeal edema.  Eyes: Conjunctivae, EOM and lids are normal. Pupils are equal, round, and reactive to light.  Neck: No JVD present. Carotid bruit is not present. No edema present. No thyroid mass and no thyromegaly present.  Cardiovascular: S1 normal and S2 normal.  Exam reveals no gallop.   No murmur heard. Pulses:      Dorsalis pedis pulses are 2+ on the right side, and 2+ on the left side.  Respiratory: No respiratory distress. She has no wheezes. She has rhonchi in the right lower field and the left lower field. She has no rales.  Better air entry today.  GI: Soft. Bowel sounds are normal. There is no tenderness.  Musculoskeletal:       Right ankle: She exhibits swelling.       Left ankle: She exhibits swelling.  Lymphadenopathy:    She has no cervical adenopathy.   Neurological: She is alert and oriented to person, place, and time. No cranial nerve deficit.  Skin: Skin is warm. No rash noted. Nails show no clubbing.  Psychiatric: She has a normal mood and affect.    Data Reviewed: Basic Metabolic Panel:  Recent Labs Lab 08/31/15 1156  NA 137  K 3.6  CL 96*  CO2 29  GLUCOSE 77  BUN 7  CREATININE 2.19*  CALCIUM 8.0*   Liver Function Tests:  Recent Labs Lab 08/31/15 1156  AST 22  ALT 15  ALKPHOS 192*  BILITOT 0.8  PROT 7.2  ALBUMIN 2.9*   CBC:  Recent Labs Lab 08/31/15 1156 09/02/15 0531  WBC 5.2 4.7  NEUTROABS 4.0  --   HGB 10.9* 10.7*  HCT 33.8* 33.0*  MCV 84.1 83.6  PLT 107* 103*   BNP (last 3 results)  Recent Labs  02/08/15 1151 02/17/15 1955  BNP 682.0* 729.0*     CBG:  Recent Labs Lab 08/31/15 1714 08/31/15 1847 09/01/15 2249  GLUCAP 56* 91 219*    Recent Results (from the past 240 hour(s))  Culture, blood (routine x 2)     Status: None (Preliminary result)   Collection Time: 08/31/15 11:56 AM  Result Value Ref Range Status   Specimen Description BLOOD RIGHT AC  Final   Special Requests BOTTLES DRAWN AEROBIC AND ANAEROBIC  1CC  Final   Culture NO GROWTH 2 DAYS  Final   Report Status PENDING  Incomplete  Culture, blood (routine x 2)     Status: None (Preliminary result)   Collection Time: 08/31/15 12:03 PM  Result Value Ref Range Status   Specimen Description BLOOD LEFT EJ  Final   Special Requests   Final    BOTTLES DRAWN AEROBIC AND ANAEROBIC  AER 2CC ANA 4CC   Culture NO GROWTH 2 DAYS  Final   Report Status PENDING  Incomplete  Urine culture     Status: None   Collection Time: 08/31/15 12:40 PM  Result Value Ref Range Status   Specimen Description URINE, RANDOM  Final   Special Requests NONE  Final   Culture 50,000 COLONIES/mL ENTEROCOCCUS FAECALIS  Final   Report Status 09/02/2015 FINAL  Final   Organism ID, Bacteria ENTEROCOCCUS FAECALIS  Final      Susceptibility   Enterococcus  faecalis - MIC*    AMPICILLIN <=2 SENSITIVE Sensitive     LINEZOLID 2 SENSITIVE Sensitive     CIPROFLOXACIN Value in next row Sensitive      SENSITIVE<=0.5    LEVOFLOXACIN Value in next row Sensitive      SENSITIVE1    NITROFURANTOIN Value in next row Sensitive      SENSITIVE<=16    TETRACYCLINE Value in next row Sensitive      SENSITIVE<=1    * 50,000 COLONIES/mL ENTEROCOCCUS FAECALIS  Culture, sputum-assessment     Status: None   Collection Time: 09/01/15  9:30 AM  Result Value Ref Range Status   Specimen Description EXPECTORATED SPUTUM  Final   Special Requests NONE  Final   Sputum evaluation THIS SPECIMEN IS ACCEPTABLE FOR SPUTUM CULTURE  Final   Report Status 09/01/2015 FINAL  Final  Culture, respiratory (NON-Expectorated)     Status: None (Preliminary result)   Collection Time: 09/01/15  9:30 AM  Result Value Ref Range Status   Specimen Description EXPECTORATED SPUTUM  Final   Special Requests NONE Reflexed from [id number]  Final   Gram Stain   Final    GOOD SPECIMEN - 80-90% WBCS MODERATE WBC SEEN RARE GRAM POSITIVE COCCI IN PAIRS AND CHAINS RARE GRAM VARIABLE ROD    Culture TOO YOUNG TO READ  Final   Report Status PENDING  Incomplete     Studies: Dg Chest 2 View  09/01/2015  CLINICAL DATA:  Increasing short of breath and cough EXAM: CHEST  2 VIEW COMPARISON:  Radiograph 08/31/2015 FINDINGS: Normal cardiac silhouette. Bilateral small effusions. Mild interstitial lung markings. LEFT vascular stent noted. IMPRESSION: Mild interstitial edema and bilateral pleural effusions not changed from prior. No overt pulmonary edema. Electronically Signed   By: Suzy Bouchard M.D.   On: 09/01/2015 11:26    Scheduled Meds: . acidophilus  1 capsule Oral Daily  . amiodarone  400 mg Oral Daily  . amLODipine  10 mg Oral Daily  . aspirin EC  81 mg Oral Daily  . atorvastatin  20 mg Oral QPM  . budesonide (PULMICORT) nebulizer solution  0.25 mg Nebulization BID  . carvedilol  25 mg Oral  BID  . cholecalciferol  400 Units Oral Daily  . citalopram  20 mg Oral Daily  . clopidogrel  75 mg Oral Daily  . famotidine  20 mg Oral Daily  . heparin  5,000 Units Subcutaneous 3 times per day  . insulin detemir  6 Units Subcutaneous QHS  . lipase/protease/amylase  12,000 Units Oral TID WC  . loratadine  10 mg Oral Daily  . methylPREDNISolone (SOLU-MEDROL) injection  40 mg Intravenous Q12H  .  metoCLOPramide  5 mg Oral TID AC & HS  . metoprolol  50 mg Oral BID  . metroNIDAZOLE  500 mg Oral 3 times per day  . multivitamin  1 tablet Oral QHS  . piperacillin-tazobactam (ZOSYN)  IV  3.375 g Intravenous Q12H  . ranolazine  1,000 mg Oral BID  . sevelamer carbonate  1,600 mg Oral TID WC  . sodium chloride  3 mL Intravenous Q12H  . [START ON 09/03/2015] vancomycin  500 mg Intravenous Q M,W,F-HD  . vitamin B-12  1,000 mcg Oral Daily    Assessment/Plan:  1. Clinical sepsis, suspected pneumonia, acute cystitis without hematuria. So far blood cultures are negative. Sputum culture with gram-positive cocci in pairs and chains. Urine culture growing enterococcus.  Patient is on Zosyn and vancomycin. Chest x-ray read by radiologist commented on fluid rather than pneumonia. Steroids added yesterday. A few days' worth of IV Zosyn should cover the enterococcus. Potentially the patient could go home tomorrow. Would recommend a short course of antibiotic therapy of 7 days. 2. End-stage renal disease- dialysis to help out with fluid management 3. Thrombocytopenia- could be secondary to infection watch closely. 4. Type 2 diabetes low-dose Lantus and sliding scale 5. Chronic diastolic congestive heart failure- dialysis to help out with fluid. I did give 1 dose of Lasix today. 6. Anxiety and depression continue psychiatric medications 7. Anemia of chronic disease 8. Essential hypertension continue usual medications 9. Recent C. difficile colitis- empiric oral Flagyl while on antibiotics, this will have to be  on long-term after antibiotics are finished also.  Code Status:     Code Status Orders        Start     Ordered   08/31/15 1600  Full code   Continuous     08/31/15 1600    Advance Directive Documentation        Most Recent Value   Type of Advance Directive  Healthcare Power of Attorney   Pre-existing out of facility DNR order (yellow form or pink MOST form)     "MOST" Form in Place?       Disposition Plan: To be determined  Consultants:  Nephrology  Antibiotics:  Oral Flagyl  Vancomycin IV  Zosyn IV  Time spent: 25 minutes  Loletha Grayer  [facility]

## 2015-09-02 NOTE — Progress Notes (Signed)
[facility]  ROUNDING NOTE   Subjective:   States she underwent HD 12/9 without problem  Recent fecal transplant on 12/1 by Dr. Vira Agar No acute c/o today  Objective:  Vital signs in last 24 hours:  Temp:  [97.8 F (36.6 C)-98.2 F (36.8 C)] 97.9 F (36.6 C) (12/11 0948) Pulse Rate:  [57-63] 60 (12/11 0948) Resp:  [18-80] 20 (12/11 0948) BP: (113-131)/(43-52) 122/49 mmHg (12/11 0948) SpO2:  [98 %-100 %] 100 % (12/11 0948)  Weight change:  Filed Weights   08/31/15 1111  Weight: 76.9 kg (169 lb 8.5 oz)    Intake/Output: I/O last 3 completed shifts: In: -  Out: 1 [Stool:1]   Intake/Output this shift:     Physical Exam: General: NAD, sick appearing  Head: Normocephalic, atraumatic. Moist oral mucosal membranes  Eyes: Anicteric,   Neck: Supple, trachea midline  Lungs:  Clear to auscultation  Heart: Regular rate and rhythm  Abdomen:  Soft, nontender,   Extremities: no peripheral edema.  Neurologic: Nonfocal, moving all four extremities  Skin: No lesions  Access: Left arm AVF    Basic Metabolic Panel:  Recent Labs Lab 08/31/15 1156  NA 137  K 3.6  CL 96*  CO2 29  GLUCOSE 77  BUN 7  CREATININE 2.19*  CALCIUM 8.0*    Liver Function Tests:  Recent Labs Lab 08/31/15 1156  AST 22  ALT 15  ALKPHOS 192*  BILITOT 0.8  PROT 7.2  ALBUMIN 2.9*   No results for input(s): LIPASE, AMYLASE in the last 168 hours. No results for input(s): AMMONIA in the last 168 hours.  CBC:  Recent Labs Lab 08/31/15 1156 09/02/15 0531  WBC 5.2 4.7  NEUTROABS 4.0  --   HGB 10.9* 10.7*  HCT 33.8* 33.0*  MCV 84.1 83.6  PLT 107* 103*    Cardiac Enzymes: No results for input(s): CKTOTAL, CKMB, CKMBINDEX, TROPONINI in the last 168 hours.  BNP: Invalid input(s): POCBNP  CBG:  Recent Labs Lab 08/31/15 1714 08/31/15 1847 09/01/15 2249  GLUCAP 56* 91 219*    Microbiology: Results for orders placed or performed during the hospital encounter of  08/31/15  Culture, blood (routine x 2)     Status: None (Preliminary result)   Collection Time: 08/31/15 11:56 AM  Result Value Ref Range Status   Specimen Description BLOOD RIGHT AC  Final   Special Requests BOTTLES DRAWN AEROBIC AND ANAEROBIC  1CC  Final   Culture NO GROWTH 2 DAYS  Final   Report Status PENDING  Incomplete  Culture, blood (routine x 2)     Status: None (Preliminary result)   Collection Time: 08/31/15 12:03 PM  Result Value Ref Range Status   Specimen Description BLOOD LEFT EJ  Final   Special Requests   Final    BOTTLES DRAWN AEROBIC AND ANAEROBIC  AER 2CC ANA 4CC   Culture NO GROWTH 2 DAYS  Final   Report Status PENDING  Incomplete  Urine culture     Status: None   Collection Time: 08/31/15 12:40 PM  Result Value Ref Range Status   Specimen Description URINE, RANDOM  Final   Special Requests NONE  Final   Culture 50,000 COLONIES/mL ENTEROCOCCUS FAECALIS  Final   Report Status 09/02/2015 FINAL  Final   Organism ID, Bacteria ENTEROCOCCUS FAECALIS  Final      Susceptibility   Enterococcus faecalis - MIC*    AMPICILLIN <=2 SENSITIVE Sensitive     LINEZOLID 2 SENSITIVE Sensitive  CIPROFLOXACIN Value in next row Sensitive      SENSITIVE<=0.5    LEVOFLOXACIN Value in next row Sensitive      SENSITIVE1    NITROFURANTOIN Value in next row Sensitive      SENSITIVE<=16    TETRACYCLINE Value in next row Sensitive      SENSITIVE<=1    * 50,000 COLONIES/mL ENTEROCOCCUS FAECALIS  Culture, sputum-assessment     Status: None   Collection Time: 09/01/15  9:30 AM  Result Value Ref Range Status   Specimen Description EXPECTORATED SPUTUM  Final   Special Requests NONE  Final   Sputum evaluation THIS SPECIMEN IS ACCEPTABLE FOR SPUTUM CULTURE  Final   Report Status 09/01/2015 FINAL  Final  Culture, respiratory (NON-Expectorated)     Status: None (Preliminary result)   Collection Time: 09/01/15  9:30 AM  Result Value Ref Range Status   Specimen Description EXPECTORATED  SPUTUM  Final   Special Requests NONE Reflexed from [id number]  Final   Gram Stain   Final    GOOD SPECIMEN - 80-90% WBCS MODERATE WBC SEEN RARE GRAM POSITIVE COCCI IN PAIRS AND CHAINS RARE GRAM VARIABLE ROD    Culture TOO YOUNG TO READ  Final   Report Status PENDING  Incomplete    Coagulation Studies: No results for input(s): LABPROT, INR in the last 72 hours.  Urinalysis:  Recent Labs  08/31/15 1240  COLORURINE YELLOW*  LABSPEC 1.009  PHURINE 9.0*  GLUCOSEU NEGATIVE  HGBUR NEGATIVE  BILIRUBINUR NEGATIVE  KETONESUR NEGATIVE  PROTEINUR 100*  NITRITE NEGATIVE  LEUKOCYTESUR TRACE*      Imaging: Dg Chest 2 View  09/01/2015  CLINICAL DATA:  Increasing short of breath and cough EXAM: CHEST  2 VIEW COMPARISON:  Radiograph 08/31/2015 FINDINGS: Normal cardiac silhouette. Bilateral small effusions. Mild interstitial lung markings. LEFT vascular stent noted. IMPRESSION: Mild interstitial edema and bilateral pleural effusions not changed from prior. No overt pulmonary edema. Electronically Signed   By: Suzy Bouchard M.D.   On: 09/01/2015 11:26     Medications:     . acidophilus  1 capsule Oral Daily  . amiodarone  400 mg Oral Daily  . amLODipine  10 mg Oral Daily  . aspirin EC  81 mg Oral Daily  . atorvastatin  20 mg Oral QPM  . budesonide (PULMICORT) nebulizer solution  0.25 mg Nebulization BID  . carvedilol  25 mg Oral BID  . cholecalciferol  400 Units Oral Daily  . citalopram  20 mg Oral Daily  . clopidogrel  75 mg Oral Daily  . famotidine  20 mg Oral Daily  . heparin  5,000 Units Subcutaneous 3 times per day  . insulin detemir  6 Units Subcutaneous QHS  . ipratropium-albuterol  3 mL Nebulization Q6H  . lipase/protease/amylase  12,000 Units Oral TID WC  . loratadine  10 mg Oral Daily  . methylPREDNISolone (SOLU-MEDROL) injection  40 mg Intravenous Q12H  . metoCLOPramide  5 mg Oral TID AC & HS  . metoprolol  50 mg Oral BID  . metroNIDAZOLE  500 mg Oral 3 times per  day  . multivitamin  1 tablet Oral QHS  . piperacillin-tazobactam (ZOSYN)  IV  3.375 g Intravenous Q12H  . ranolazine  1,000 mg Oral BID  . sevelamer carbonate  1,600 mg Oral TID WC  . sodium chloride  3 mL Intravenous Q12H  . [START ON 09/03/2015] vancomycin  500 mg Intravenous Q M,W,F-HD  . vitamin B-12  1,000 mcg Oral Daily  sodium chloride, albuterol, benzonatate, guaiFENesin, HYDROcodone-acetaminophen, meclizine, nitroGLYCERIN, polyethylene glycol, pregabalin, sodium chloride  Assessment/ Plan:  [name] is a 54 y.o. 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  Patient admitted on 08/31/2015   [facility] MWF [place]  1. End Stage Renal Disease: No acute indication for dialysis. Continue MWF schedule. Monitor daily for dialysis need.   2. Sepsis/pneumonia:  Currently treated with zosyn and vancomycin.   3. Hypertension: blood pressure at goal.  - Continue home regimen  4. Anemia of chronic kidney disease: hemoglobin 10.7 - gets micera as outpatient.  5. Secondary Hyperparathyroidism: outpatient phosphorus, calcium and PTH at goal.  - currently not on binders.     LOS: 2 [name] 12/11/20161:20 PM

## 2015-09-03 DIAGNOSIS — A419 Sepsis, unspecified organism: Secondary | ICD-10-CM | POA: Diagnosis not present

## 2015-09-03 LAB — CULTURE, RESPIRATORY W GRAM STAIN: Culture: NORMAL

## 2015-09-03 LAB — CBC
HCT: 35.3 % (ref 35.0–47.0)
Hemoglobin: 11.5 g/dL — ABNORMAL LOW (ref 12.0–16.0)
MCH: 27.3 pg (ref 26.0–34.0)
MCHC: 32.7 g/dL (ref 32.0–36.0)
MCV: 83.3 fL (ref 80.0–100.0)
Platelets: 132 10*3/uL — ABNORMAL LOW (ref 150–440)
RBC: 4.23 MIL/uL (ref 3.80–5.20)
RDW: 19.6 % — ABNORMAL HIGH (ref 11.5–14.5)
WBC: 3.2 10*3/uL — ABNORMAL LOW (ref 3.6–11.0)

## 2015-09-03 LAB — GLUCOSE, CAPILLARY
Glucose-Capillary: 180 mg/dL — ABNORMAL HIGH (ref 65–99)
Glucose-Capillary: 181 mg/dL — ABNORMAL HIGH (ref 65–99)
Glucose-Capillary: 134 mg/dL — ABNORMAL HIGH (ref 65–99)

## 2015-09-03 MED ORDER — METRONIDAZOLE 500 MG PO TABS: 500.0000 mg | ORAL_TABLET | Freq: Three times a day (TID) | ORAL | Status: DC

## 2015-09-03 MED ORDER — AMOXICILLIN-POT CLAVULANATE 500-125 MG PO TABS: 1.0000 | ORAL_TABLET | Freq: Every day | ORAL | Status: DC

## 2015-09-03 MED ORDER — HEPARIN SODIUM (PORCINE) 5000 UNIT/ML IJ SOLN: 5000.0000 [IU] | Freq: Two times a day (BID) | INTRAMUSCULAR | Status: DC

## 2015-09-03 NOTE — Discharge Summary (Signed)
[place] [facility] [place] NAME: [name]    MR#:  [id number]  DATE OF BIRTH:  [birth year]  DATE OF ADMISSION:  08/31/2015 ADMITTING PHYSICIAN: Demetrios Loll, MD  DATE OF DISCHARGE: 09/03/2015 PRIMARY CARE PHYSICIAN: Ellamae Sia, MD    ADMISSION DIAGNOSIS:  Hypoxia [R09.02] Fever, unspecified fever cause [R50.9]   DISCHARGE DIAGNOSIS:  Clinical sepsis, suspected pneumonia, acute cystitis without hematuria.   SECONDARY DIAGNOSIS:   Past Medical History  Diagnosis Date  . Asthma   . Collagen vascular disease ([place])   . Diabetes mellitus without complication ([place])   . Hypertension   . Coronary artery disease     a. cath 2013: stenting to RCA (report not available); b. cath 2014: LM nl, pLAD 40%, mLAD nl, ost LCx 40%, mid LCx nl, pRCA 30% @ site of prior stent, mRCA 50%  . Diabetic neuropathy ([place])   . ESRD (end stage renal disease) on dialysis ([place])     M-W-F  . GERD (gastroesophageal reflux disease)   . Hx of pancreatitis 2015  . Myocardial infarction ([place])   . Pneumonia   . Mitral regurgitation     a. echo 10/2013: EF 62%, noWMA, mildly dilated LA, mild to mod MR/TR, GR1DD  . COPD (chronic obstructive pulmonary disease) ([place])   . chronic diastolic CHF [id number]  . dialysis 2006  . Depression   . Anxiety   . Arthritis   . Headache   . Anemia     HOSPITAL COURSE:   1. Clinical sepsis, suspected pneumonia, acute cystitis without hematuria. So far blood cultures are negative. Sputum culture with gram-positive cocci in pairs and chains. Urine culture growing enterococcus. Patient is on Zosyn and vancomycin. Chest x-ray read by radiologist commented on fluid rather than pneumonia. Steroids added yesterday. A few days' worth of IV Zosyn should cover the enterococcus. Will change to augmentin 500 mg po daily for 7 days. 2. End-stage renal disease- on dialysis.  3. Thrombocytopenia- could be secondary to infection, stable. 4. Type  2 diabetes low-dose Lantus and sliding scale 5. Chronic diastolic congestive heart failure- dialysis to help out with fluid.  6. Anxiety and depression continue psychiatric medications 7. Anemia of chronic disease 8. Essential hypertension continue usual medications 9. Recent C. difficile colitis- empiric oral Flagyl while on antibiotics. We'll give Flagyl for 12 more days since the patient will be on Augmentin. Continue risaquad.  DISCHARGE CONDITIONS:   Stable, discharge to home today.  CONSULTS OBTAINED:     DRUG ALLERGIES:   Allergies  Allergen Reactions  . Compazine [Prochlorperazine Edisylate] Anaphylaxis and Nausea And Vomiting  . Ace Inhibitors Swelling  . Ativan [Lorazepam] Other (See Comments)    Reaction:  Hallucinations and headaches  . Codeine Nausea And Vomiting  . Gabapentin Other (See Comments)    Reaction:  Unknown   . Losartan Other (See Comments)    Reaction:  Unknown   . Ondansetron Other (See Comments)    Reaction:  Unknown   . Prochlorperazine Other (See Comments)    Reaction:  Unknown   . Scopolamine Other (See Comments)    Reaction:  Unknown   . Zofran [Ondansetron Hcl] Other (See Comments)    Reaction:  hallucinations   . Oxycodone Anxiety  . Tape Rash    DISCHARGE MEDICATIONS:   Current Discharge Medication List    START taking these medications   Details  amoxicillin-clavulanate (AUGMENTIN) 500-125 MG tablet Take 1 tablet (500 mg total)  by mouth daily. Qty: 7 tablet, Refills: 0      CONTINUE these medications which have CHANGED   Details  metroNIDAZOLE (FLAGYL) 500 MG tablet Take 1 tablet (500 mg total) by mouth every 8 (eight) hours. Qty: 36 tablet, Refills: 0      CONTINUE these medications which have NOT CHANGED   Details  acidophilus (RISAQUAD) CAPS capsule Take 1 capsule by mouth daily. Qty: 30 capsule, Refills: 0    albuterol (PROVENTIL) (2.5 MG/3ML) 0.083% nebulizer solution Take 2.5 mg by nebulization every 4 (four) hours  as needed for wheezing or shortness of breath.    amiodarone (PACERONE) 200 MG tablet Take 400 mg by mouth daily.    amLODipine (NORVASC) 10 MG tablet Take 10 mg by mouth daily.     aspirin EC 81 MG tablet Take 81 mg by mouth daily.    atorvastatin (LIPITOR) 20 MG tablet Take 20 mg by mouth every evening.    b complex-vitamin c-folic acid (NEPHRO-VITE) 0.8 MG TABS tablet Take 1 tablet by mouth daily.    carvedilol (COREG) 25 MG tablet Take 25 mg by mouth 2 (two) times daily.    cetirizine (ZYRTEC) 10 MG tablet Take 10 mg by mouth daily.    Cholecalciferol (VITAMIN D3) 400 UNITS tablet Take 400 Units by mouth daily.    citalopram (CELEXA) 20 MG tablet Take 20 mg by mouth daily.    clopidogrel (PLAVIX) 75 MG tablet Take 75 mg by mouth daily.    glucose 4 GM chewable tablet Chew 3-4 tablets by mouth as needed for low blood sugar (if pts blood sugar is less than 60).    HYDROcodone-acetaminophen (NORCO/VICODIN) 5-325 MG per tablet Take 1 tablet by mouth 2 (two) times daily as needed for severe pain.     insulin aspart (NOVOLOG) 100 UNIT/ML injection Inject into the skin 3 (three) times daily before meals. Pt uses as needed per sliding scale.    insulin detemir (LEVEMIR) 100 UNIT/ML injection Inject 6 Units into the skin at bedtime.    lipase/protease/amylase (CREON) 12000 UNITS CPEP capsule Take 12,000 Units by mouth 3 (three) times daily with meals.     meclizine (ANTIVERT) 25 MG tablet Take 25 mg by mouth 2 (two) times daily as needed for dizziness.    metoCLOPramide (REGLAN) 5 MG tablet Take 1 tablet (5 mg total) by mouth 4 (four) times daily -  before meals and at bedtime. Qty: 40 tablet, Refills: 0    metoprolol (LOPRESSOR) 50 MG tablet Take 50 mg by mouth 2 (two) times daily.    nitroGLYCERIN (NITROSTAT) 0.4 MG SL tablet Place 0.4 mg under the tongue every 5 (five) minutes x 3 doses as needed for chest pain. *Max 3 doses per episode*    Omega-3 Fatty Acids (FISH OIL) 1000  MG CAPS Take 1,000 mg by mouth daily.    ondansetron (ZOFRAN ODT) 4 MG disintegrating tablet Take 1 tablet (4 mg total) by mouth every 8 (eight) hours as needed for nausea or vomiting. Qty: 20 tablet, Refills: 0    polyethylene glycol (MIRALAX / GLYCOLAX) packet Take 17 g by mouth daily as needed for mild constipation.    pregabalin (LYRICA) 25 MG capsule Take 25 mg by mouth 2 (two) times daily as needed (for leg pain).    promethazine (PHENERGAN) 25 MG suppository Place 1 suppository (25 mg total) rectally every 8 (eight) hours as needed for nausea or vomiting. Qty: 12 each, Refills: 0    ranitidine (ZANTAC) 150  MG tablet Take 150 mg by mouth daily.     ranolazine (RANEXA) 1000 MG SR tablet Take 1,000 mg by mouth 2 (two) times daily.    sevelamer carbonate (RENVELA) 800 MG tablet Take 1,600 mg by mouth 3 (three) times daily with meals.     vitamin B-12 (CYANOCOBALAMIN) 1000 MCG tablet Take 1,000 mcg by mouth daily.    albuterol (PROVENTIL HFA;VENTOLIN HFA) 108 (90 BASE) MCG/ACT inhaler Inhale 4-6 puffs by mouth every 4 hours as needed for wheezing, cough, and/or shortness of breath Qty: 1 Inhaler, Refills: 1    docusate sodium (COLACE) 100 MG capsule Take 1 capsule (100 mg total) by mouth 2 (two) times daily. Qty: 60 capsule, Refills: 0    guaiFENesin-dextromethorphan (ROBITUSSIN DM) 100-10 MG/5ML syrup Take 10 mLs by mouth every 6 (six) hours as needed for cough. Qty: 118 mL, Refills: 0    prochlorperazine (COMPAZINE) 5 MG tablet Take 1 tablet (5 mg total) by mouth every 6 (six) hours as needed for nausea or vomiting. Qty: 30 tablet, Refills: 1    sucralfate (CARAFATE) 1 G tablet Take 1 tablet (1 g total) by mouth 4 (four) times daily -  with meals and at bedtime. Qty: 120 tablet, Refills: 0         DISCHARGE INSTRUCTIONS:    If you experience worsening of your admission symptoms, develop shortness of breath, life threatening emergency, suicidal or homicidal thoughts you  must seek medical attention immediately by calling 911 or calling your MD immediately  if symptoms less severe.  You Must read complete instructions/literature along with all the possible adverse reactions/side effects for all the Medicines you take and that have been prescribed to you. Take any new Medicines after you have completely understood and accept all the possible adverse reactions/side effects.   Please note  You were cared for by a hospitalist during your hospital stay. If you have any questions about your discharge medications or the care you received while you were in the hospital after you are discharged, you can call the unit and asked to speak with the hospitalist on call if the hospitalist that took care of you is not available. Once you are discharged, your primary care physician will handle any further medical issues. Please note that NO REFILLS for any discharge medications will be authorized once you are discharged, as it is imperative that you return to your primary care physician (or establish a relationship with a primary care physician if you do not have one) for your aftercare needs so that they can reassess your need for medications and monitor your lab values.    Today   SUBJECTIVE   No complaint.   VITAL SIGNS:  Blood pressure 141/60, pulse 52, temperature 97.9 F (36.6 C), temperature source Oral, resp. rate 16, height 5' 0.5" (1.537 m), weight 79 kg (174 lb 2.6 oz), SpO2 100 %.  I/O:   Intake/Output Summary (Last 24 hours) at 09/03/15 1746 Last data filed at 09/03/15 1600  Gross per 24 hour  Intake    100 ml  Output      0 ml  Net    100 ml    PHYSICAL EXAMINATION:  GENERAL:  54 y.o.-year-old patient lying in the bed with no acute distress.  EYES: Pupils equal, round, reactive to light and accommodation. No scleral icterus. Extraocular muscles intact.  HEENT: Head atraumatic, normocephalic. Oropharynx and nasopharynx clear. Moist oral mucosa. NECK:   Supple, no jugular venous distention. No thyroid enlargement,  no tenderness.  LUNGS: Normal breath sounds bilaterally, no wheezing, rales,rhonchi or crepitation. No use of accessory muscles of respiration.  CARDIOVASCULAR: S1, S2 normal. No murmurs, rubs, or gallops.  ABDOMEN: Soft, non-tender, non-distended. Bowel sounds present. No organomegaly or mass.  EXTREMITIES: No pedal edema, cyanosis, or clubbing.  NEUROLOGIC: Cranial nerves II through XII are intact. Muscle strength 5/5 in all extremities. Sensation intact. Gait not checked.  PSYCHIATRIC: The patient is alert and oriented x 3.  SKIN: No obvious rash, lesion, or ulcer.   DATA REVIEW:   CBC  Recent Labs Lab 09/03/15 0634  WBC 3.2*  HGB 11.5*  HCT 35.3  PLT 132*    Chemistries   Recent Labs Lab 08/31/15 1156  NA 137  K 3.6  CL 96*  CO2 29  GLUCOSE 77  BUN 7  CREATININE 2.19*  CALCIUM 8.0*  AST 22  ALT 15  ALKPHOS 192*  BILITOT 0.8    Cardiac Enzymes No results for input(s): TROPONINI in the last 168 hours.  Microbiology Results  Results for orders placed or performed during the hospital encounter of 08/31/15  Culture, blood (routine x 2)     Status: None (Preliminary result)   Collection Time: 08/31/15 11:56 AM  Result Value Ref Range Status   Specimen Description BLOOD RIGHT AC  Final   Special Requests BOTTLES DRAWN AEROBIC AND ANAEROBIC  1CC  Final   Culture NO GROWTH 3 DAYS  Final   Report Status PENDING  Incomplete  Culture, blood (routine x 2)     Status: None (Preliminary result)   Collection Time: 08/31/15 12:03 PM  Result Value Ref Range Status   Specimen Description BLOOD LEFT EJ  Final   Special Requests   Final    BOTTLES DRAWN AEROBIC AND ANAEROBIC  AER 2CC ANA 4CC   Culture NO GROWTH 3 DAYS  Final   Report Status PENDING  Incomplete  Urine culture     Status: None   Collection Time: 08/31/15 12:40 PM  Result Value Ref Range Status   Specimen Description URINE, RANDOM  Final    Special Requests NONE  Final   Culture 50,000 COLONIES/mL ENTEROCOCCUS FAECALIS  Final   Report Status 09/02/2015 FINAL  Final   Organism ID, Bacteria ENTEROCOCCUS FAECALIS  Final      Susceptibility   Enterococcus faecalis - MIC*    AMPICILLIN <=2 SENSITIVE Sensitive     LINEZOLID 2 SENSITIVE Sensitive     CIPROFLOXACIN Value in next row Sensitive      SENSITIVE<=0.5    LEVOFLOXACIN Value in next row Sensitive      SENSITIVE1    NITROFURANTOIN Value in next row Sensitive      SENSITIVE<=16    TETRACYCLINE Value in next row Sensitive      SENSITIVE<=1    * 50,000 COLONIES/mL ENTEROCOCCUS FAECALIS  Culture, sputum-assessment     Status: None   Collection Time: 09/01/15  9:30 AM  Result Value Ref Range Status   Specimen Description EXPECTORATED SPUTUM  Final   Special Requests NONE  Final   Sputum evaluation THIS SPECIMEN IS ACCEPTABLE FOR SPUTUM CULTURE  Final   Report Status 09/01/2015 FINAL  Final  Culture, respiratory (NON-Expectorated)     Status: None   Collection Time: 09/01/15  9:30 AM  Result Value Ref Range Status   Specimen Description EXPECTORATED SPUTUM  Final   Special Requests NONE Reflexed from [id number]  Final   Gram Stain   Final    GOOD  SPECIMEN - 80-90% WBCS MODERATE WBC SEEN RARE GRAM POSITIVE COCCI IN PAIRS AND CHAINS RARE GRAM VARIABLE ROD    Culture Consistent with normal respiratory flora.  Final   Report Status 09/03/2015 FINAL  Final    RADIOLOGY:  No results found.      Management plans discussed with the patient, family and they are in agreement.  CODE STATUS:     Code Status Orders        Start     Ordered   08/31/15 1600  Full code   Continuous     08/31/15 1600    Advance Directive Documentation        Most Recent Value   Type of Advance Directive  Healthcare Power of Attorney   Pre-existing out of facility DNR order (yellow form or pink MOST form)     "MOST" Form in Place?        TOTAL TIME TAKING CARE OF THIS PATIENT: 35  minutes.    Demetrios Loll M.D on 09/03/2015 at 5:46 PM  Between 7am to 6pm - Pager - [phone]  After 6pm go to [email or web address] - password [email or web address] [facility]  Office  [phone]  CC: Primary care physician; Ellamae Sia, MD

## 2015-09-03 NOTE — Care Management (Signed)
Admitted to [facility] with the diagnosis of pneumonia. Lives with grandchildren. Sister is Domingo Cocking [phone]). Last seen Dr. Quay Burow 3 weeks ago. [facility] Monday-Wednesday-Friday x 10 years. Uses [facility] van for transportation. Followed by [facility] for therapy per Ms. Petti. No skilled facility. No home oxygen. Uses a cane/rolling walker to aid in ambulation. Takes care of all basic activities of daily living herself. No falls. Good appetite Scheduled for dialysis today. If discharged later today, sister will transport. Shelbie Ammons RN MSN CCM Care Management [phone]

## 2015-09-03 NOTE — Progress Notes (Signed)
Pre-hd tx 

## 2015-09-03 NOTE — Progress Notes (Signed)
PT Cancellation Note  Patient Details Name: BREON CAPOTE MRN: [id number] DOB: 12-Sep-1961   Cancelled Treatment:    Reason Eval/Treat Not Completed: Patient at procedure or test/unavailable. Patient is currently receiving dialysis in her room and unavailable for mobility evaluation. PT will continue to attempt as patient is available and appropriate.   Kerman Passey, PT, DPT    09/03/2015, 2:52 PM

## 2015-09-03 NOTE — Progress Notes (Signed)
Order for heparin 5000 units subcutaneously q 8 hours for DVT prophylaxis was changed to q 12 hours per anticoagulation protocol due to the fact the patient is on HD.  [name] 8:28 AM

## 2015-09-03 NOTE — Progress Notes (Signed)
MD making rounds. Discharge orders received. Appointment Scheduled. Left Jugular IV removed. Prescriptions given to patient. Discharge paperwork provided, explained, signed and witnessed. Education handouts provided to patient. No unanswered questions. Escorted via wheelchair by nursing staff. All belongings sent with patient and family.

## 2015-09-03 NOTE — Care Management Important Message (Signed)
Important Message  Patient Details  Name: [name] MRN: [id number] Date of Birth: 1960-11-25   Medicare Important Message Given:  Yes    Shelbie Ammons, RN 09/03/2015, 12:08 PM

## 2015-09-03 NOTE — Progress Notes (Signed)
HD tx start 

## 2015-09-03 NOTE — Plan of Care (Signed)
Problem: Pain Managment: Goal: General experience of comfort will improve Outcome: Progressing No complaints of pain.  Problem: Physical Regulation: Goal: Ability to maintain clinical measurements within normal limits will improve Outcome: Progressing Scheduled for dialysis today. Schedule: Monday, Wednesday  and Friday. Goal: Will remain free from infection Outcome: Progressing Continues ABX.

## 2015-09-03 NOTE — Discharge Instructions (Signed)
Heart healthy, ADA and renal diet. Activity as tolerated.

## 2015-09-03 NOTE — Progress Notes (Signed)
[facility]  ROUNDING NOTE   Subjective:  Patient due for hemodialysis today. Overall feeling better as compared to admission. Patient seen prior to dialysis.  Objective:  Vital signs in last 24 hours:  Temp:  [97.5 F (36.4 C)-98.6 F (37 C)] 97.5 F (36.4 C) (12/12 0513) Pulse Rate:  [49-57] 49 (12/12 0850) Resp:  [18] 18 (12/12 0850) BP: (129-131)/(45-51) 129/46 mmHg (12/12 0850) SpO2:  [95 %-100 %] 100 % (12/12 0850)  Weight change:  Filed Weights   08/31/15 1111  Weight: 76.9 kg (169 lb 8.5 oz)    Intake/Output:     Intake/Output this shift:     Physical Exam: General: NAD  Head: Normocephalic, atraumatic. Moist oral mucosal membranes  Eyes: Anicteric  Neck: Supple, trachea midline  Lungs:  Clear to auscultation, normal effort  Heart: Regular rate and rhythm  Abdomen:  Soft, nontender, BS present  Extremities: no peripheral edema.  Neurologic: Nonfocal, moving all four extremities  Skin: No lesions  Access: Left arm AVF    Basic Metabolic Panel:  Recent Labs Lab 08/31/15 1156  NA 137  K 3.6  CL 96*  CO2 29  GLUCOSE 77  BUN 7  CREATININE 2.19*  CALCIUM 8.0*    Liver Function Tests:  Recent Labs Lab 08/31/15 1156  AST 22  ALT 15  ALKPHOS 192*  BILITOT 0.8  PROT 7.2  ALBUMIN 2.9*   No results for input(s): LIPASE, AMYLASE in the last 168 hours. No results for input(s): AMMONIA in the last 168 hours.  CBC:  Recent Labs Lab 08/31/15 1156 09/02/15 0531 09/03/15 0634  WBC 5.2 4.7 3.2*  NEUTROABS 4.0  --   --   HGB 10.9* 10.7* 11.5*  HCT 33.8* 33.0* 35.3  MCV 84.1 83.6 83.3  PLT 107* 103* 132*    Cardiac Enzymes: No results for input(s): CKTOTAL, CKMB, CKMBINDEX, TROPONINI in the last 168 hours.  BNP: Invalid input(s): POCBNP  CBG:  Recent Labs Lab 08/31/15 1847 09/01/15 2249 09/02/15 2125 09/03/15 0730 09/03/15 1148  GLUCAP 91 219* 386* 180* 181*    Microbiology: Results for orders placed or  performed during the hospital encounter of 08/31/15  Culture, blood (routine x 2)     Status: None (Preliminary result)   Collection Time: 08/31/15 11:56 AM  Result Value Ref Range Status   Specimen Description BLOOD RIGHT AC  Final   Special Requests BOTTLES DRAWN AEROBIC AND ANAEROBIC  1CC  Final   Culture NO GROWTH 2 DAYS  Final   Report Status PENDING  Incomplete  Culture, blood (routine x 2)     Status: None (Preliminary result)   Collection Time: 08/31/15 12:03 PM  Result Value Ref Range Status   Specimen Description BLOOD LEFT EJ  Final   Special Requests   Final    BOTTLES DRAWN AEROBIC AND ANAEROBIC  AER 2CC ANA 4CC   Culture NO GROWTH 2 DAYS  Final   Report Status PENDING  Incomplete  Urine culture     Status: None   Collection Time: 08/31/15 12:40 PM  Result Value Ref Range Status   Specimen Description URINE, RANDOM  Final   Special Requests NONE  Final   Culture 50,000 COLONIES/mL ENTEROCOCCUS FAECALIS  Final   Report Status 09/02/2015 FINAL  Final   Organism ID, Bacteria ENTEROCOCCUS FAECALIS  Final      Susceptibility   Enterococcus faecalis - MIC*    AMPICILLIN <=2 SENSITIVE Sensitive     LINEZOLID 2 SENSITIVE Sensitive  CIPROFLOXACIN Value in next row Sensitive      SENSITIVE<=0.5    LEVOFLOXACIN Value in next row Sensitive      SENSITIVE1    NITROFURANTOIN Value in next row Sensitive      SENSITIVE<=16    TETRACYCLINE Value in next row Sensitive      SENSITIVE<=1    * 50,000 COLONIES/mL ENTEROCOCCUS FAECALIS  Culture, sputum-assessment     Status: None   Collection Time: 09/01/15  9:30 AM  Result Value Ref Range Status   Specimen Description EXPECTORATED SPUTUM  Final   Special Requests NONE  Final   Sputum evaluation THIS SPECIMEN IS ACCEPTABLE FOR SPUTUM CULTURE  Final   Report Status 09/01/2015 FINAL  Final  Culture, respiratory (NON-Expectorated)     Status: None   Collection Time: 09/01/15  9:30 AM  Result Value Ref Range Status   Specimen  Description EXPECTORATED SPUTUM  Final   Special Requests NONE Reflexed from [id number]  Final   Gram Stain   Final    GOOD SPECIMEN - 80-90% WBCS MODERATE WBC SEEN RARE GRAM POSITIVE COCCI IN PAIRS AND CHAINS RARE GRAM VARIABLE ROD    Culture Consistent with normal respiratory flora.  Final   Report Status 09/03/2015 FINAL  Final    Coagulation Studies: No results for input(s): LABPROT, INR in the last 72 hours.  Urinalysis: No results for input(s): COLORURINE, LABSPEC, PHURINE, GLUCOSEU, HGBUR, BILIRUBINUR, KETONESUR, PROTEINUR, UROBILINOGEN, NITRITE, LEUKOCYTESUR in the last 72 hours.  Invalid input(s): APPERANCEUR    Imaging: No results found.   Medications:     . acidophilus  1 capsule Oral Daily  . amiodarone  400 mg Oral Daily  . amLODipine  10 mg Oral Daily  . aspirin EC  81 mg Oral Daily  . atorvastatin  20 mg Oral QPM  . carvedilol  25 mg Oral BID  . cholecalciferol  400 Units Oral Daily  . citalopram  20 mg Oral Daily  . clopidogrel  75 mg Oral Daily  . famotidine  20 mg Oral Daily  . heparin  5,000 Units Subcutaneous Q12H  . insulin aspart  0-9 Units Subcutaneous TID AC & HS  . insulin detemir  6 Units Subcutaneous QHS  . lipase/protease/amylase  12,000 Units Oral TID WC  . loratadine  10 mg Oral Daily  . methylPREDNISolone (SOLU-MEDROL) injection  40 mg Intravenous Q12H  . metoCLOPramide  5 mg Oral TID AC & HS  . metoprolol  50 mg Oral BID  . metroNIDAZOLE  500 mg Oral 3 times per day  . multivitamin  1 tablet Oral QHS  . piperacillin-tazobactam (ZOSYN)  IV  3.375 g Intravenous Q12H  . pneumococcal 23 valent vaccine  0.5 mL Intramuscular Tomorrow-1000  . ranolazine  1,000 mg Oral BID  . sevelamer carbonate  1,600 mg Oral TID WC  . sodium chloride  3 mL Intravenous Q12H  . vancomycin  500 mg Intravenous Q M,W,F-HD  . vitamin B-12  1,000 mcg Oral Daily   sodium chloride, albuterol, benzonatate, guaiFENesin, HYDROcodone-acetaminophen,  ipratropium-albuterol, meclizine, nitroGLYCERIN, polyethylene glycol, pregabalin, sodium chloride  Assessment/ Plan:  [name] is a 54 y.o. 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  Patient admitted on 08/31/2015   [facility] M[facility] [place]  1. End Stage Renal Disease: patient due for hemodialysis today.  Orders have been prepared.  Continue dialysis on a Monday, Wednesday, Friday schedule.   2. Sepsis/pneumonia:  Enterococcus noted in the urine.  Patient also had probable pneumonia.  Maintained on vancomycin and Zosyn at this time.   3. Hypertension: blood pressure well-controlled.  Continue current antihypertensives.  4. Anemia of chronic kidney disease: hemoglobin currently 11.5.  Continue her to point stimulating agent therapy as outpatient.  5. Secondary Hyperparathyroidism: Continue Renvela 1600 mg by mouth 3 times a day.   LOS: 3 Rien Marland 12/12/20162:08 PM

## 2015-09-06 LAB — CULTURE, BLOOD (ROUTINE X 2)
Culture: NO GROWTH
Culture: NO GROWTH

## 2015-09-19 IMAGING — CR DG ABDOMEN ACUTE W/ 1V CHEST
4 series · 4 of 4 positions shown · non-contrast
Comparison: CT 05/07/2015.  Chest x-ray 04/24/2015.

CLINICAL DATA: Right upper quadrant pain, mid sternal pain during
dialysis. Abdominal pain for 1 week.

EXAM:
DG ABDOMEN ACUTE W/ 1V CHEST

[chest pa]
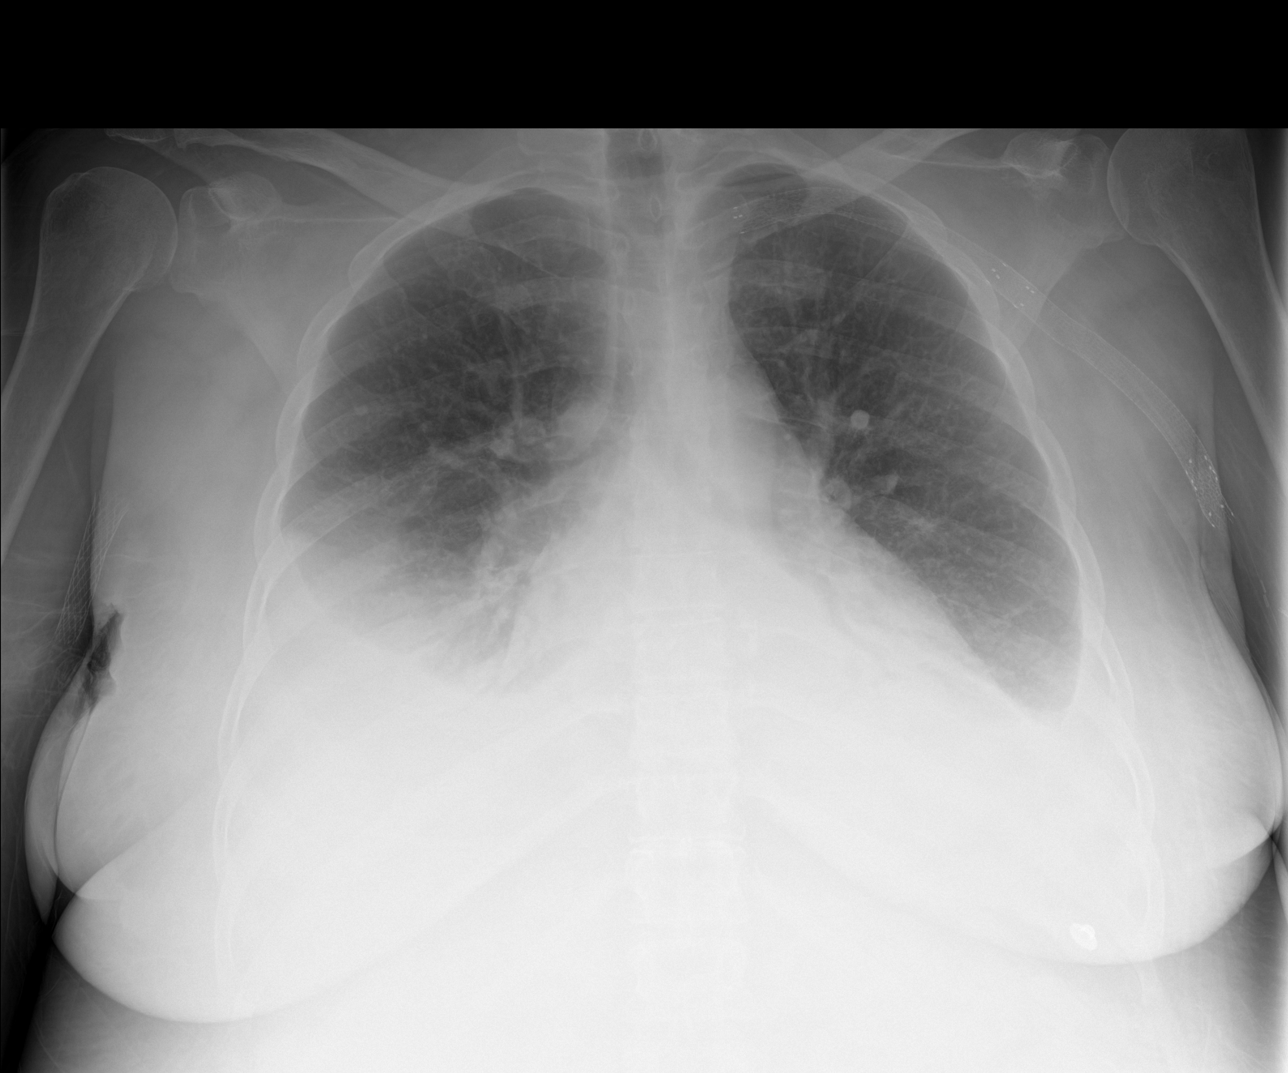

[abdomen erect]
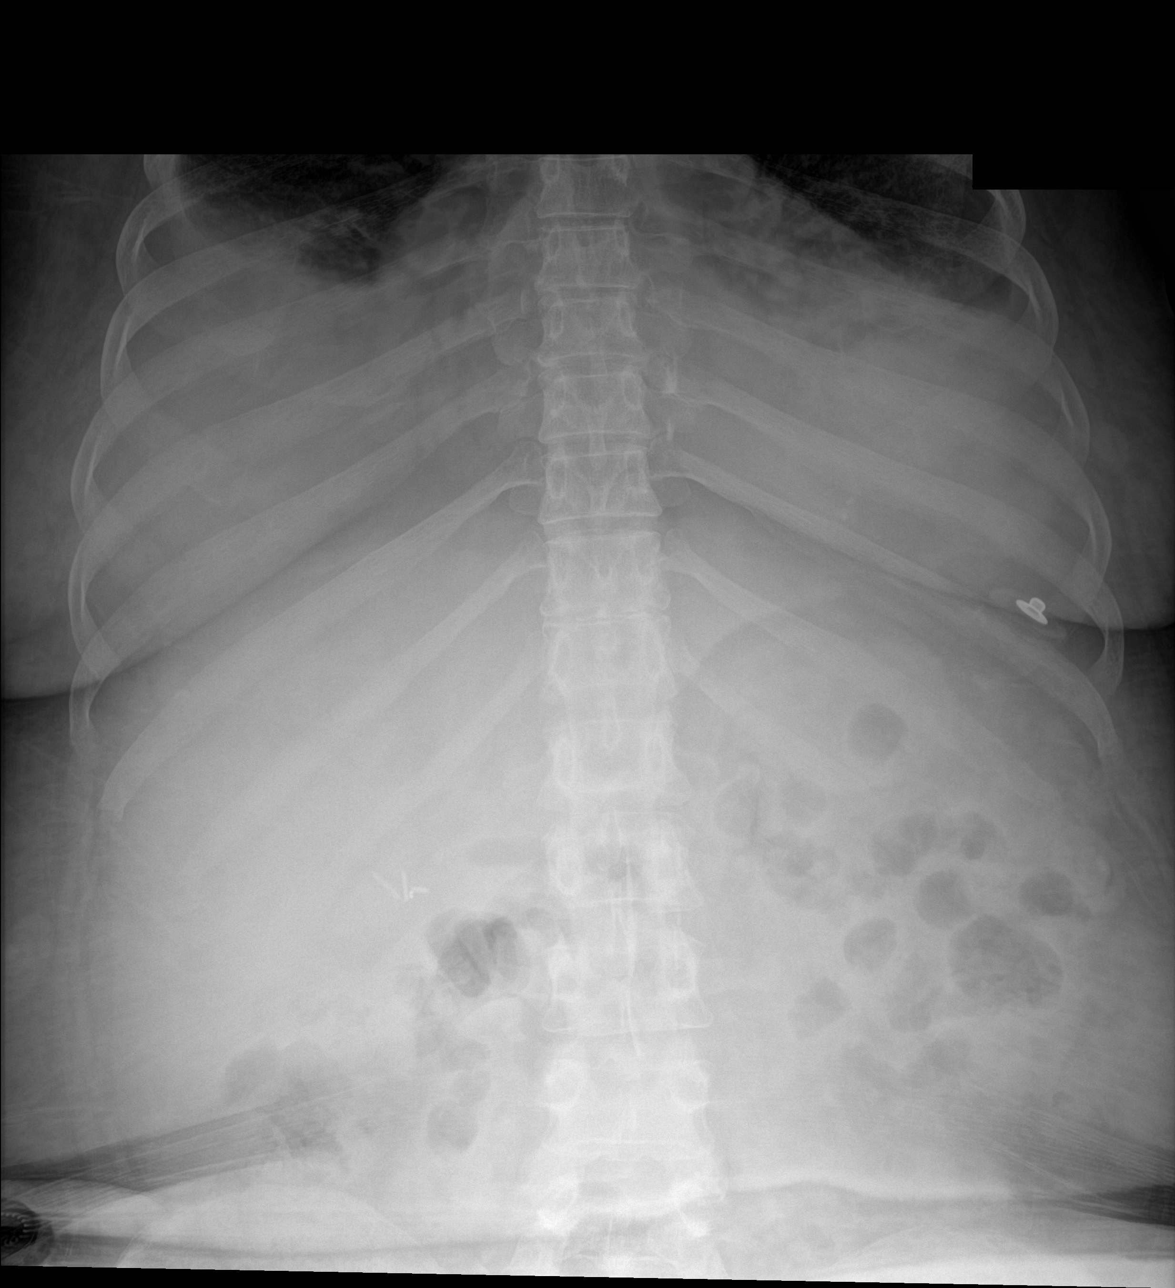

[abdomen supine (1 of 2)]
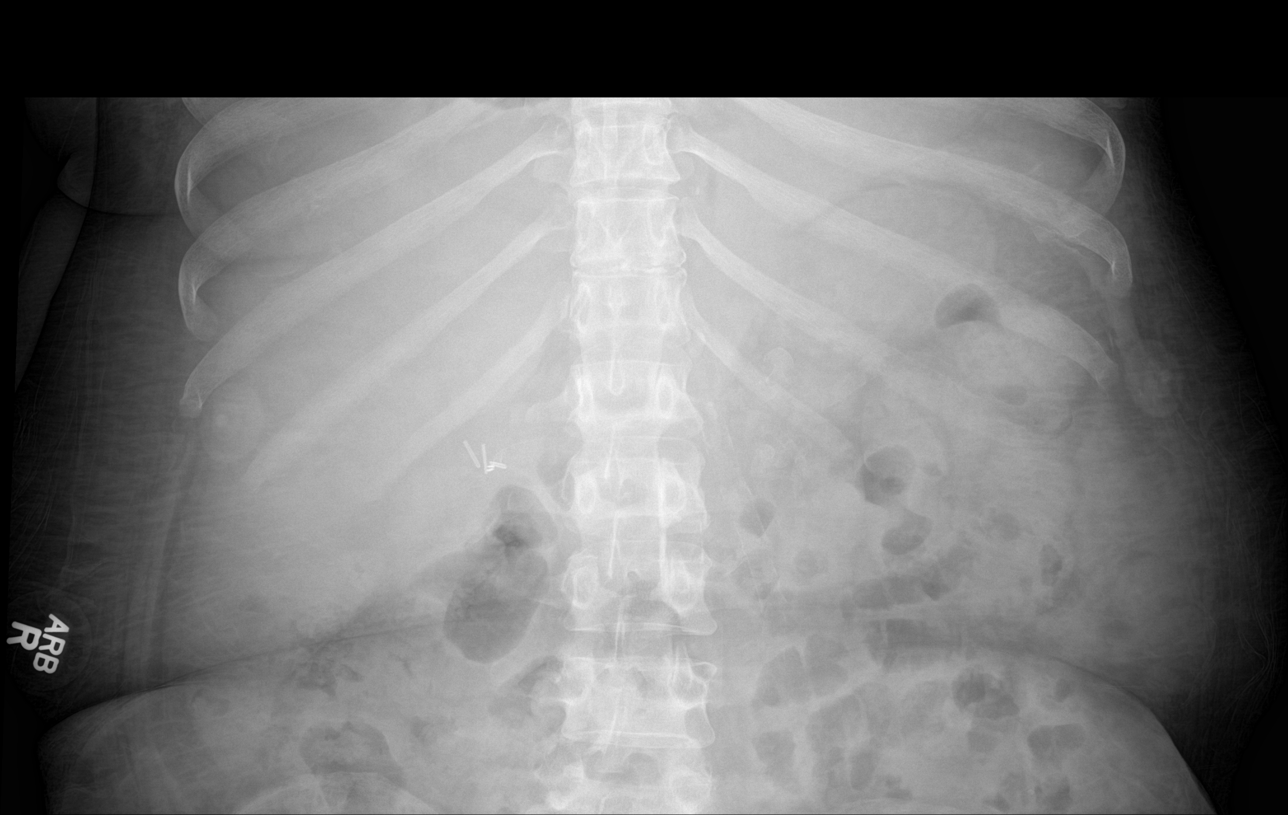

[abdomen supine (2 of 2)]
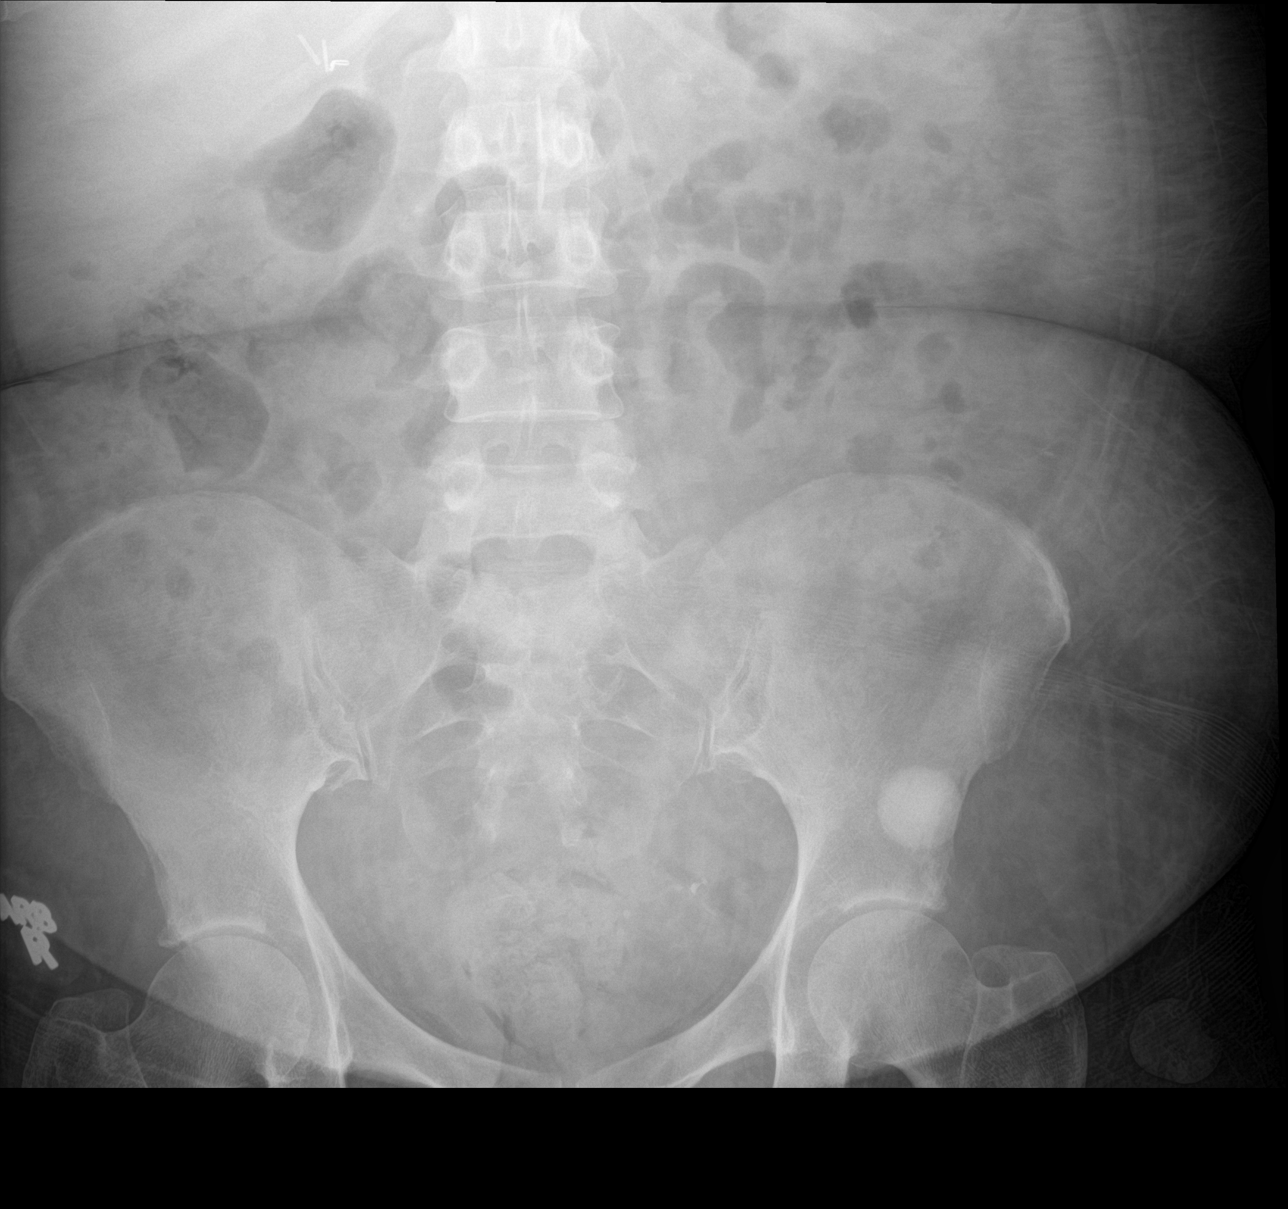

[4 of 4 positions shown; findings below may reference images not displayed]

FINDINGS: Interval extubation. There are moderate layering bilateral pleural
effusions. Bibasilar airspace opacities could reflect atelectasis or
edema. Heart is borderline in size. No acute bony abnormality.

Nonobstructive bowel gas pattern. No free air organomegaly. Prior
cholecystectomy. No suspicious calcification.
IMPRESSION: Moderate layering bilateral effusions with bilateral lower lobe
atelectasis or edema. Borderline heart size.

No evidence of bowel obstruction or free air.

## 2015-09-26 IMAGING — CR DG ABDOMEN ACUTE W/ 1V CHEST
1 series · 4 of 4 positions shown · non-contrast
Comparison: 05/14/2015

CLINICAL DATA: Intermittent nausea and vomiting for the past 3
months with epigastric pain and vomiting today, recent diagnosis of
pneumonia

EXAM:
DG ABDOMEN ACUTE W/ 1V CHEST

[Series 1: dg abd acute w/chest · 0.14mm/px · 4 of 4 slices shown]
[im 1/4]
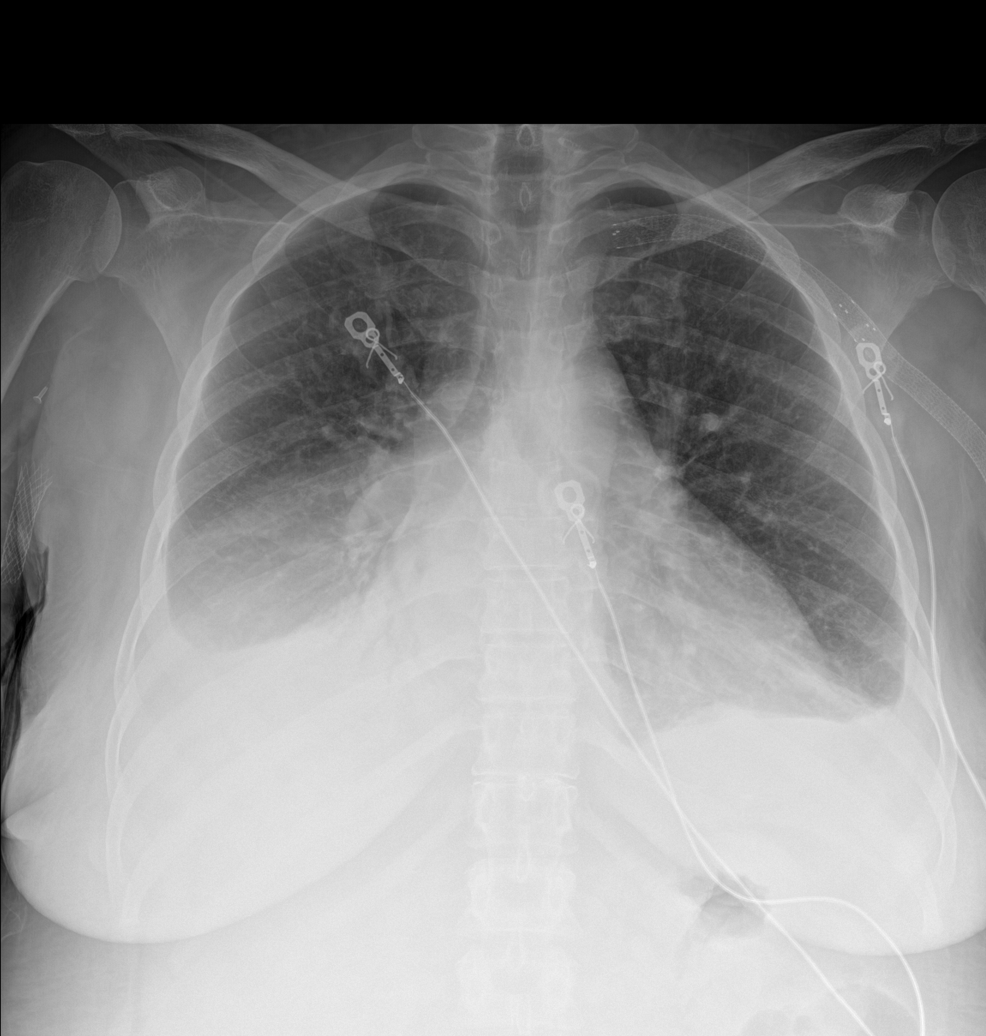
[im 2/4]
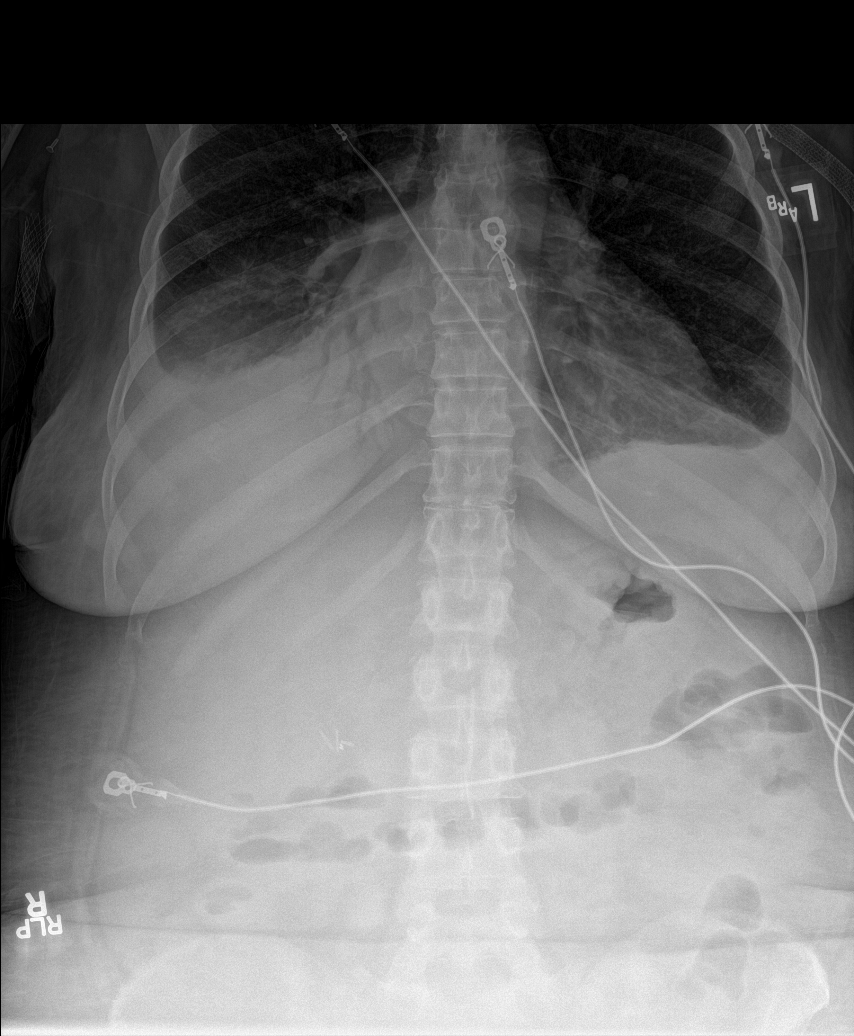
[im 3/4]
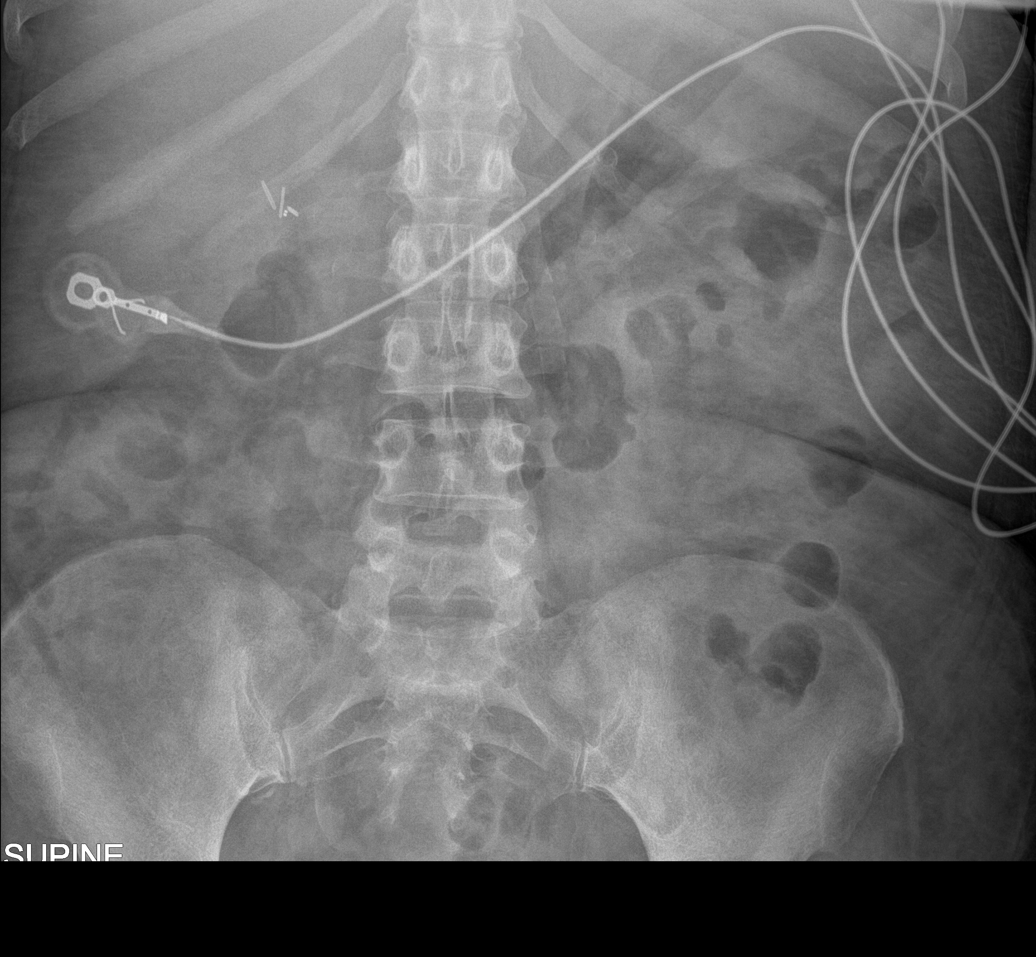
[im 4/4]
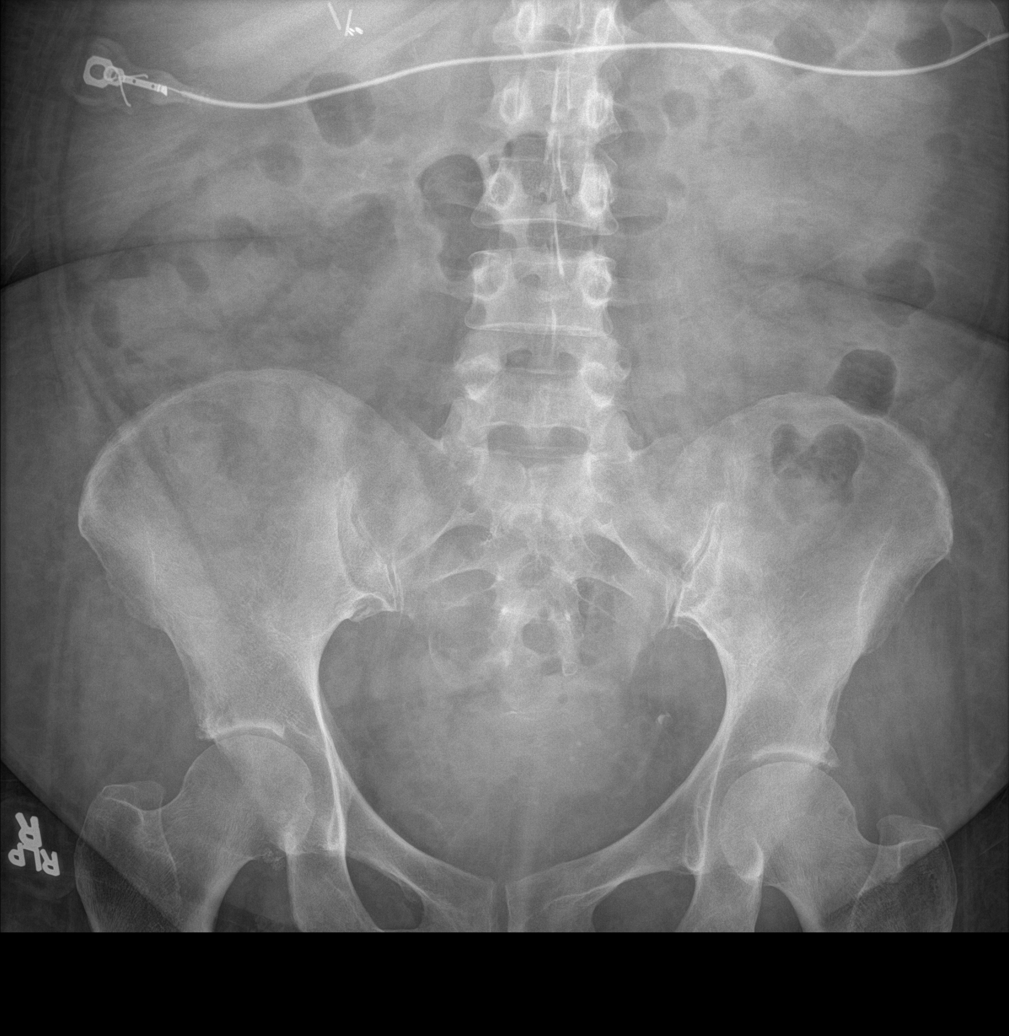

[4 of 4 positions shown; findings below may reference images not displayed]

FINDINGS: Mild cardiac enlargement. Mild vascular congestion and interstitial
prominence. Small bilateral pleural effusions larger on the right.
Underlying consolidation bilateral lung bases right greater than
left.

No abnormally dilated loops of bowel. Air-fluid levels noted in
scattered loops of small and large bowel. Status post
cholecystectomy.
IMPRESSION: Nonobstructive bowel gas pattern. Similar appearance of the chest
when compared to prior study again indicating bilateral pleural
effusions with underlying consolidation. Stable cardiac enlargement
and vascular congestion.

## 2015-10-03 ENCOUNTER — Inpatient Hospital Stay
Admission: EM | Admit: 2015-10-03 | Discharge: 2015-10-05 | DRG: 640 | Disposition: A | Discharge status: Home Health | Payer: Medicare Other | Attending: Internal Medicine | Admitting: Internal Medicine

## 2015-10-03 ENCOUNTER — Encounter: Payer: Self-pay | Admitting: Emergency Medicine

## 2015-10-03 ENCOUNTER — Emergency Department: Payer: Medicare Other

## 2015-10-03 DIAGNOSIS — Z79899 Other long term (current) drug therapy: Secondary | ICD-10-CM

## 2015-10-03 DIAGNOSIS — I132 Hypertensive heart and chronic kidney disease with heart failure and with stage 5 chronic kidney disease, or end stage renal disease: Secondary | ICD-10-CM | POA: Diagnosis present

## 2015-10-03 DIAGNOSIS — Z7982 Long term (current) use of aspirin: Secondary | ICD-10-CM

## 2015-10-03 DIAGNOSIS — I440 Atrioventricular block, first degree: Secondary | ICD-10-CM | POA: Diagnosis present

## 2015-10-03 DIAGNOSIS — D631 Anemia in chronic kidney disease: Secondary | ICD-10-CM | POA: Diagnosis present

## 2015-10-03 DIAGNOSIS — Z888 Allergy status to other drugs, medicaments and biological substances status: Secondary | ICD-10-CM | POA: Diagnosis not present

## 2015-10-03 DIAGNOSIS — I959 Hypotension, unspecified: Secondary | ICD-10-CM | POA: Diagnosis present

## 2015-10-03 DIAGNOSIS — E11319 Type 2 diabetes mellitus with unspecified diabetic retinopathy without macular edema: Secondary | ICD-10-CM | POA: Diagnosis present

## 2015-10-03 DIAGNOSIS — K219 Gastro-esophageal reflux disease without esophagitis: Secondary | ICD-10-CM | POA: Diagnosis present

## 2015-10-03 DIAGNOSIS — E1140 Type 2 diabetes mellitus with diabetic neuropathy, unspecified: Secondary | ICD-10-CM | POA: Diagnosis present

## 2015-10-03 DIAGNOSIS — I442 Atrioventricular block, complete: Secondary | ICD-10-CM | POA: Diagnosis present

## 2015-10-03 DIAGNOSIS — Z992 Dependence on renal dialysis: Secondary | ICD-10-CM

## 2015-10-03 DIAGNOSIS — Z794 Long term (current) use of insulin: Secondary | ICD-10-CM | POA: Diagnosis not present

## 2015-10-03 DIAGNOSIS — N186 End stage renal disease: Secondary | ICD-10-CM | POA: Diagnosis present

## 2015-10-03 DIAGNOSIS — I2510 Atherosclerotic heart disease of native coronary artery without angina pectoris: Secondary | ICD-10-CM | POA: Diagnosis present

## 2015-10-03 DIAGNOSIS — J449 Chronic obstructive pulmonary disease, unspecified: Secondary | ICD-10-CM | POA: Diagnosis present

## 2015-10-03 DIAGNOSIS — E876 Hypokalemia: Secondary | ICD-10-CM | POA: Diagnosis present

## 2015-10-03 DIAGNOSIS — F1210 Cannabis abuse, uncomplicated: Secondary | ICD-10-CM | POA: Diagnosis present

## 2015-10-03 DIAGNOSIS — E1129 Type 2 diabetes mellitus with other diabetic kidney complication: Secondary | ICD-10-CM

## 2015-10-03 DIAGNOSIS — I252 Old myocardial infarction: Secondary | ICD-10-CM | POA: Diagnosis not present

## 2015-10-03 DIAGNOSIS — I5032 Chronic diastolic (congestive) heart failure: Secondary | ICD-10-CM | POA: Diagnosis present

## 2015-10-03 DIAGNOSIS — Z885 Allergy status to narcotic agent status: Secondary | ICD-10-CM | POA: Diagnosis not present

## 2015-10-03 DIAGNOSIS — R001 Bradycardia, unspecified: Secondary | ICD-10-CM | POA: Diagnosis present

## 2015-10-03 DIAGNOSIS — M1990 Unspecified osteoarthritis, unspecified site: Secondary | ICD-10-CM | POA: Diagnosis present

## 2015-10-03 DIAGNOSIS — Z7902 Long term (current) use of antithrombotics/antiplatelets: Secondary | ICD-10-CM

## 2015-10-03 DIAGNOSIS — I10 Essential (primary) hypertension: Secondary | ICD-10-CM | POA: Diagnosis present

## 2015-10-03 DIAGNOSIS — E119 Type 2 diabetes mellitus without complications: Secondary | ICD-10-CM

## 2015-10-03 DIAGNOSIS — J45909 Unspecified asthma, uncomplicated: Secondary | ICD-10-CM | POA: Diagnosis present

## 2015-10-03 DIAGNOSIS — E1122 Type 2 diabetes mellitus with diabetic chronic kidney disease: Secondary | ICD-10-CM | POA: Diagnosis present

## 2015-10-03 DIAGNOSIS — R4189 Other symptoms and signs involving cognitive functions and awareness: Secondary | ICD-10-CM | POA: Diagnosis present

## 2015-10-03 DIAGNOSIS — G9340 Encephalopathy, unspecified: Secondary | ICD-10-CM | POA: Diagnosis present

## 2015-10-03 DIAGNOSIS — F329 Major depressive disorder, single episode, unspecified: Secondary | ICD-10-CM | POA: Diagnosis present

## 2015-10-03 DIAGNOSIS — E785 Hyperlipidemia, unspecified: Secondary | ICD-10-CM | POA: Diagnosis present

## 2015-10-03 DIAGNOSIS — J9811 Atelectasis: Secondary | ICD-10-CM | POA: Diagnosis present

## 2015-10-03 DIAGNOSIS — Z9115 Patient's noncompliance with renal dialysis: Secondary | ICD-10-CM | POA: Diagnosis not present

## 2015-10-03 DIAGNOSIS — E11649 Type 2 diabetes mellitus with hypoglycemia without coma: Secondary | ICD-10-CM

## 2015-10-03 DIAGNOSIS — F419 Anxiety disorder, unspecified: Secondary | ICD-10-CM | POA: Diagnosis present

## 2015-10-03 DIAGNOSIS — N2581 Secondary hyperparathyroidism of renal origin: Secondary | ICD-10-CM | POA: Diagnosis present

## 2015-10-03 DIAGNOSIS — I340 Nonrheumatic mitral (valve) insufficiency: Secondary | ICD-10-CM | POA: Diagnosis present

## 2015-10-03 DIAGNOSIS — E875 Hyperkalemia: Secondary | ICD-10-CM | POA: Diagnosis present

## 2015-10-03 LAB — BASIC METABOLIC PANEL
Anion gap: 9 (ref 5–15)
Anion gap: 9 (ref 5–15)
BUN: 36 mg/dL — ABNORMAL HIGH (ref 6–20)
BUN: 18 mg/dL (ref 6–20)
CO2: 29 mmol/L (ref 22–32)
CO2: 33 mmol/L — ABNORMAL HIGH (ref 22–32)
Calcium: 8.6 mg/dL — ABNORMAL LOW (ref 8.9–10.3)
Calcium: 8.8 mg/dL — ABNORMAL LOW (ref 8.9–10.3)
Chloride: 96 mmol/L — ABNORMAL LOW (ref 101–111)
Chloride: 95 mmol/L — ABNORMAL LOW (ref 101–111)
Creatinine, Ser: 6.5 mg/dL — ABNORMAL HIGH (ref 0.44–1.00)
Creatinine, Ser: 3.7 mg/dL — ABNORMAL HIGH (ref 0.44–1.00)
GFR calc Af Amer: 7 mL/min — ABNORMAL LOW (ref >60)
GFR calc Af Amer: 15 mL/min — ABNORMAL LOW (ref >60)
GFR calc non Af Amer: 6 mL/min — ABNORMAL LOW (ref >60)
GFR calc non Af Amer: 13 mL/min — ABNORMAL LOW (ref >60)
Glucose, Bld: 55 mg/dL — ABNORMAL LOW (ref 65–99)
Glucose, Bld: 70 mg/dL (ref 65–99)
Potassium: 5.5 mmol/L — ABNORMAL HIGH (ref 3.5–5.1)
Potassium: 4 mmol/L (ref 3.5–5.1)
Sodium: 134 mmol/L — ABNORMAL LOW (ref 135–145)
Sodium: 137 mmol/L (ref 135–145)

## 2015-10-03 LAB — GLUCOSE, CAPILLARY
Glucose-Capillary: 106 mg/dL — ABNORMAL HIGH (ref 65–99)
Glucose-Capillary: 115 mg/dL — ABNORMAL HIGH (ref 65–99)
Glucose-Capillary: 126 mg/dL — ABNORMAL HIGH (ref 65–99)
Glucose-Capillary: 155 mg/dL — ABNORMAL HIGH (ref 65–99)
Glucose-Capillary: 213 mg/dL — ABNORMAL HIGH (ref 65–99)
Glucose-Capillary: 54 mg/dL — ABNORMAL LOW (ref 65–99)
Glucose-Capillary: 62 mg/dL — ABNORMAL LOW (ref 65–99)
Glucose-Capillary: 63 mg/dL — ABNORMAL LOW (ref 65–99)
Glucose-Capillary: 68 mg/dL (ref 65–99)
Glucose-Capillary: 73 mg/dL (ref 65–99)
Glucose-Capillary: 86 mg/dL (ref 65–99)
Glucose-Capillary: 88 mg/dL (ref 65–99)
Glucose-Capillary: 88 mg/dL (ref 65–99)
Glucose-Capillary: 91 mg/dL (ref 65–99)
Glucose-Capillary: 96 mg/dL (ref 65–99)

## 2015-10-03 LAB — CBC
HCT: 32.3 % — ABNORMAL LOW (ref 35.0–47.0)
Hemoglobin: 10.6 g/dL — ABNORMAL LOW (ref 12.0–16.0)
MCH: 28 pg (ref 26.0–34.0)
MCHC: 32.9 g/dL (ref 32.0–36.0)
MCV: 85.1 fL (ref 80.0–100.0)
Platelets: 213 10*3/uL (ref 150–440)
RBC: 3.79 MIL/uL — ABNORMAL LOW (ref 3.80–5.20)
RDW: 23 % — ABNORMAL HIGH (ref 11.5–14.5)
WBC: 8.8 10*3/uL (ref 3.6–11.0)

## 2015-10-03 LAB — TROPONIN I
Troponin I: 0.03 ng/mL (ref <0.031)
Troponin I: 0.03 ng/mL (ref <0.031)
Troponin I: 0.04 ng/mL — ABNORMAL HIGH (ref <0.031)
Troponin I: 0.09 ng/mL — ABNORMAL HIGH (ref <0.031)

## 2015-10-03 LAB — COMPREHENSIVE METABOLIC PANEL
ALT: 17 U/L (ref 14–54)
AST: 43 U/L — ABNORMAL HIGH (ref 15–41)
Albumin: 3 g/dL — ABNORMAL LOW (ref 3.5–5.0)
Alkaline Phosphatase: 507 U/L — ABNORMAL HIGH (ref 38–126)
Anion gap: 12 (ref 5–15)
BUN: 37 mg/dL — ABNORMAL HIGH (ref 6–20)
CO2: 25 mmol/L (ref 22–32)
Calcium: 8.3 mg/dL — ABNORMAL LOW (ref 8.9–10.3)
Chloride: 96 mmol/L — ABNORMAL LOW (ref 101–111)
Creatinine, Ser: 6.5 mg/dL — ABNORMAL HIGH (ref 0.44–1.00)
GFR calc Af Amer: 7 mL/min — ABNORMAL LOW (ref >60)
GFR calc non Af Amer: 6 mL/min — ABNORMAL LOW (ref >60)
Glucose, Bld: 142 mg/dL — ABNORMAL HIGH (ref 65–99)
Potassium: 6.6 mmol/L (ref 3.5–5.1)
Sodium: 133 mmol/L — ABNORMAL LOW (ref 135–145)
Total Bilirubin: 1.1 mg/dL (ref 0.3–1.2)
Total Protein: 6.9 g/dL (ref 6.5–8.1)

## 2015-10-03 LAB — CBC WITH DIFFERENTIAL/PLATELET
Band Neutrophils: 1 %
Basophils Absolute: 0 10*3/uL (ref 0–0.1)
Basophils Relative: 0 %
Blasts: 0 %
Eosinophils Absolute: 0.3 10*3/uL (ref 0–0.7)
Eosinophils Relative: 6 %
HCT: 34.4 % — ABNORMAL LOW (ref 35.0–47.0)
Hemoglobin: 11.1 g/dL — ABNORMAL LOW (ref 12.0–16.0)
Lymphocytes Relative: 19 %
Lymphs Abs: 1.1 10*3/uL (ref 1.0–3.6)
MCH: 27.1 pg (ref 26.0–34.0)
MCHC: 32.3 g/dL (ref 32.0–36.0)
MCV: 83.9 fL (ref 80.0–100.0)
Metamyelocytes Relative: 0 %
Monocytes Absolute: 0.5 10*3/uL (ref 0.2–0.9)
Monocytes Relative: 9 %
Myelocytes: 0 %
Neutro Abs: 3.8 10*3/uL (ref 1.4–6.5)
Neutrophils Relative %: 65 %
Other: 0 %
Platelets: 202 10*3/uL (ref 150–440)
Promyelocytes Absolute: 0 %
RBC: 4.1 MIL/uL (ref 3.80–5.20)
RDW: 22 % — ABNORMAL HIGH (ref 11.5–14.5)
WBC: 5.7 10*3/uL (ref 3.6–11.0)
nRBC: 0 /100 WBC

## 2015-10-03 LAB — BLOOD GAS, VENOUS
Acid-base deficit: 1 mmol/L (ref 0.0–2.0)
Bicarbonate: 26.8 mEq/L (ref 21.0–28.0)
Patient temperature: 37
pCO2, Ven: 57 mmHg (ref 44.0–60.0)
pH, Ven: 7.28 — ABNORMAL LOW (ref 7.320–7.430)

## 2015-10-03 LAB — MRSA PCR SCREENING: MRSA by PCR: NEGATIVE

## 2015-10-03 LAB — BRAIN NATRIURETIC PEPTIDE: B Natriuretic Peptide: 948 pg/mL — ABNORMAL HIGH (ref 0.0–100.0)

## 2015-10-03 LAB — CK: Total CK: 30 U/L — ABNORMAL LOW (ref 38–234)

## 2015-10-03 LAB — AMMONIA: Ammonia: 67 umol/L — ABNORMAL HIGH (ref 9–35)

## 2015-10-03 LAB — SALICYLATE LEVEL: Salicylate Lvl: <4 mg/dL (ref 2.8–30.0)

## 2015-10-03 LAB — LIPASE, BLOOD: Lipase: 18 U/L (ref 11–51)

## 2015-10-03 LAB — ACETAMINOPHEN LEVEL: Acetaminophen (Tylenol), Serum: <10 ug/mL — ABNORMAL LOW (ref 10–30)

## 2015-10-03 MED ORDER — FENTANYL CITRATE (PF) 100 MCG/2ML IJ SOLN
50.0000 ug | Freq: Once | INTRAMUSCULAR | Status: AC
  Administered 2015-10-03: 50 ug via INTRAVENOUS

## 2015-10-03 MED ORDER — INSULIN ASPART 100 UNIT/ML ~~LOC~~ SOLN
SUBCUTANEOUS | Status: AC
  Filled 2015-10-03: qty 10

## 2015-10-03 MED ORDER — DOPAMINE-DEXTROSE 3.2-5 MG/ML-% IV SOLN
0.0000 ug/kg/min | INTRAVENOUS | Status: DC
  Administered 2015-10-03: 14 ug/kg/min via INTRAVENOUS
  Filled 2015-10-03: qty 250

## 2015-10-03 MED ORDER — ATORVASTATIN CALCIUM 20 MG PO TABS
20.0000 mg | ORAL_TABLET | Freq: Every evening | ORAL | Status: DC
  Administered 2015-10-03 – 2015-10-05 (×3): 20 mg via ORAL
  Filled 2015-10-03 (×3): qty 1

## 2015-10-03 MED ORDER — DEXTROSE 50 % IV SOLN
1.0000 | Freq: Once | INTRAVENOUS | Status: AC
  Administered 2015-10-03: 50 mL via INTRAVENOUS

## 2015-10-03 MED ORDER — FENTANYL CITRATE (PF) 100 MCG/2ML IJ SOLN
INTRAMUSCULAR | Status: AC
  Filled 2015-10-03: qty 2

## 2015-10-03 MED ORDER — ONDANSETRON HCL 4 MG/2ML IJ SOLN
INTRAMUSCULAR | Status: AC
  Filled 2015-10-03: qty 2

## 2015-10-03 MED ORDER — DEXTROSE 50 % IV SOLN
25.0000 mL | INTRAVENOUS | Status: AC
  Administered 2015-10-03: 25 mL via INTRAVENOUS

## 2015-10-03 MED ORDER — CITALOPRAM HYDROBROMIDE 20 MG PO TABS
20.0000 mg | ORAL_TABLET | Freq: Every day | ORAL | Status: DC
  Administered 2015-10-03 – 2015-10-05 (×3): 20 mg via ORAL
  Filled 2015-10-03 (×3): qty 1

## 2015-10-03 MED ORDER — FENTANYL CITRATE (PF) 100 MCG/2ML IJ SOLN
INTRAMUSCULAR | Status: AC | PRN
  Administered 2015-10-03: 50 ug via INTRAVENOUS

## 2015-10-03 MED ORDER — SEVELAMER CARBONATE 800 MG PO TABS
1600.0000 mg | ORAL_TABLET | Freq: Three times a day (TID) | ORAL | Status: DC
  Administered 2015-10-03 – 2015-10-05 (×5): 1600 mg via ORAL
  Filled 2015-10-03 (×7): qty 2

## 2015-10-03 MED ORDER — DEXTROSE 50 % IV SOLN
INTRAVENOUS | Status: AC
  Administered 2015-10-03: 25 mL via INTRAVENOUS
  Filled 2015-10-03: qty 50

## 2015-10-03 MED ORDER — CALCIUM CHLORIDE 10 % IV SOLN
INTRAVENOUS | Status: AC | PRN
  Administered 2015-10-03: 1 g via INTRAVENOUS

## 2015-10-03 MED ORDER — INSULIN ASPART 100 UNIT/ML ~~LOC~~ SOLN
10.0000 [IU] | Freq: Once | SUBCUTANEOUS | Status: AC
  Administered 2015-10-03: 10 [IU] via INTRAVENOUS

## 2015-10-03 MED ORDER — SODIUM BICARBONATE 8.4 % IV SOLN
INTRAVENOUS | Status: DC
  Administered 2015-10-03 (×2): via INTRAVENOUS
  Filled 2015-10-03 (×2): qty 150

## 2015-10-03 MED ORDER — CLOPIDOGREL BISULFATE 75 MG PO TABS
75.0000 mg | ORAL_TABLET | Freq: Every day | ORAL | Status: DC
  Administered 2015-10-03 – 2015-10-05 (×3): 75 mg via ORAL
  Filled 2015-10-03 (×3): qty 1

## 2015-10-03 MED ORDER — EPINEPHRINE HCL 0.1 MG/ML IJ SOSY
PREFILLED_SYRINGE | INTRAMUSCULAR | Status: AC | PRN
  Administered 2015-10-03: 1 mg via INTRAVENOUS

## 2015-10-03 MED ORDER — DEXTROSE 50 % IV SOLN: 1.0000 | Freq: Once | INTRAVENOUS | Status: DC

## 2015-10-03 MED ORDER — SODIUM BICARBONATE 8.4 % IV SOLN
INTRAVENOUS | Status: AC | PRN
  Administered 2015-10-03 (×2): 50 meq via INTRAVENOUS

## 2015-10-03 MED ORDER — SODIUM CHLORIDE 0.9 % IV BOLUS (SEPSIS)
1000.0000 mL | Freq: Once | INTRAVENOUS | Status: AC
  Administered 2015-10-03: 1000 mL via INTRAVENOUS

## 2015-10-03 MED ORDER — DEXTROSE 50 % IV SOLN: 50.0000 g | Freq: Once | INTRAVENOUS | Status: DC

## 2015-10-03 MED ORDER — DEXTROSE 50 % IV SOLN
INTRAVENOUS | Status: AC
  Filled 2015-10-03: qty 50

## 2015-10-03 MED ORDER — RANOLAZINE ER 500 MG PO TB12
1000.0000 mg | ORAL_TABLET | Freq: Two times a day (BID) | ORAL | Status: DC
  Administered 2015-10-03 – 2015-10-04 (×2): 1000 mg via ORAL
  Filled 2015-10-03 (×2): qty 2

## 2015-10-03 MED ORDER — DEXTROSE 50 % IV SOLN
25.0000 mL | Freq: Once | INTRAVENOUS | Status: AC
  Administered 2015-10-03: 25 mL via INTRAVENOUS

## 2015-10-03 MED ORDER — DEXTROSE 5 % IV SOLN
INTRAVENOUS | Status: DC
  Administered 2015-10-03 – 2015-10-04 (×2): via INTRAVENOUS

## 2015-10-03 MED ORDER — ALBUTEROL SULFATE (2.5 MG/3ML) 0.083% IN NEBU: 2.5000 mg | INHALATION_SOLUTION | RESPIRATORY_TRACT | Status: AC

## 2015-10-03 MED ORDER — SODIUM CHLORIDE 0.9 % IJ SOLN
3.0000 mL | Freq: Two times a day (BID) | INTRAMUSCULAR | Status: DC
  Administered 2015-10-03 – 2015-10-05 (×5): 3 mL via INTRAVENOUS

## 2015-10-03 MED ORDER — PROMETHAZINE HCL 25 MG/ML IJ SOLN
12.5000 mg | Freq: Four times a day (QID) | INTRAMUSCULAR | Status: DC | PRN
  Administered 2015-10-03 – 2015-10-05 (×6): 12.5 mg via INTRAVENOUS
  Filled 2015-10-03 (×7): qty 1

## 2015-10-03 MED ORDER — DOPAMINE-DEXTROSE 3.2-5 MG/ML-% IV SOLN
INTRAVENOUS | Status: AC | PRN
  Administered 2015-10-03: 20 ug/kg/min via INTRAVENOUS

## 2015-10-03 MED ORDER — HEPARIN SODIUM (PORCINE) 5000 UNIT/ML IJ SOLN
5000.0000 [IU] | Freq: Three times a day (TID) | INTRAMUSCULAR | Status: DC
  Administered 2015-10-03 – 2015-10-05 (×7): 5000 [IU] via SUBCUTANEOUS
  Filled 2015-10-03 (×6): qty 1

## 2015-10-03 MED ORDER — DEXTROSE 50 % IV SOLN
INTRAVENOUS | Status: AC | PRN
  Administered 2015-10-03: 1 via INTRAVENOUS

## 2015-10-03 MED ORDER — INSULIN ASPART 100 UNIT/ML ~~LOC~~ SOLN
0.0000 [IU] | Freq: Four times a day (QID) | SUBCUTANEOUS | Status: DC
  Administered 2015-10-04 (×2): 2 [IU] via SUBCUTANEOUS
  Filled 2015-10-03 (×2): qty 2

## 2015-10-03 MED ORDER — ASPIRIN EC 81 MG PO TBEC
81.0000 mg | DELAYED_RELEASE_TABLET | Freq: Every day | ORAL | Status: DC
  Administered 2015-10-03 – 2015-10-05 (×3): 81 mg via ORAL
  Filled 2015-10-03 (×3): qty 1

## 2015-10-03 MED ORDER — INSULIN REGULAR HUMAN 100 UNIT/ML IJ SOLN: 10.0000 [IU] | Freq: Once | INTRAMUSCULAR | Status: DC

## 2015-10-03 MED FILL — Medication: Qty: 1 | Status: AC

## 2015-10-03 NOTE — Code Documentation (Signed)
Chest X-ray in Room

## 2015-10-03 NOTE — Code Documentation (Signed)
Critical Potassium reported by lab @ 6.6

## 2015-10-03 NOTE — Care Management (Signed)
Patient with frequent admissions and ED visits.  She presents with episodes of unresponsiveness - one in the ED.  Patient also with bradycardia and has transvenous pacemaker and dopamine.  Anticipate both will be stopped today.  Patient missed her dialysis appointment due to weather.  Anticipate patient will transfer out of icu this day

## 2015-10-03 NOTE — Progress Notes (Signed)
[facility] Progress Note Patient Name: [name] DOB: 12/18/1960 MRN: [id number]   Date of Service  10/03/2015  HPI/Events of Note  H/O ESRD. Admitted with bradycardia (transcutaneous paced), hyperkalemia  eICU Interventions  HR and BP stable on dopamine, Will get emergent HD. No EICU interventions.     Intervention Category Evaluation Type: New Patient Evaluation  Herma Uballe 10/03/2015, 4:26 AM

## 2015-10-03 NOTE — ED Notes (Signed)
Pt reports last dialysis treatment was Friday and she was sick on Monday when she was last scheduled for dialysis. Pt is A/Ox4, verbal at this time and responds to verbal and stimulation responses.

## 2015-10-03 NOTE — H&P (Signed)
[facility] [place] NAME: [name]    MR#:  [id number]  DATE OF BIRTH:  1961/08/25  DATE OF ADMISSION:  10/03/2015  PRIMARY CARE PHYSICIAN: Ellamae Sia, MD   REQUESTING/REFERRING PHYSICIAN: Jacqualine Code, M.D.  CHIEF COMPLAINT:   Chief Complaint  Patient presents with  . Respiratory Distress    HISTORY OF PRESENT ILLNESS:  [name]  is a 55 y.o. female who presents unresponsive. Patient was found unresponsive and then transported by EMS to the ED for evaluation. She is an end-stage renal disease patient on hemodialysis who missed her last dialysis session. Here she was found to be bradycardic with a junctional rhythm in the 30s. Her potassium was found to be 6.6. Transvenous pacing was initiated and the patient became more responsive, though she states that she is very sleepy. She was also hypotensive, and was placed on dopamine with some improvement in her blood pressure. Cardiology and nephrology were both urgently consult from the ED. Urgent hemodialysis planned and will be initiated shortly. Cardiology is following in case the patient needs placement of transvenous pacer, though she is pacing well with external pacing at this time. Hospitalists were called for admission.  PAST MEDICAL HISTORY:   Past Medical History  Diagnosis Date  . Asthma   . Collagen vascular disease ([place])   . Diabetes mellitus without complication ([place])   . Hypertension   . Coronary artery disease     a. cath 2013: stenting to RCA (report not available); b. cath 2014: LM nl, pLAD 40%, mLAD nl, ost LCx 40%, mid LCx nl, pRCA 30% @ site of prior stent, mRCA 50%  . Diabetic neuropathy ([place])   . ESRD (end stage renal disease) on dialysis ([place])     M-W-F  . GERD (gastroesophageal reflux disease)   . Hx of pancreatitis 2015  . Myocardial infarction ([place])   . Pneumonia   . Mitral regurgitation     a. echo 10/2013: EF 62%, noWMA, mildly dilated LA, mild to mod  MR/TR, GR1DD  . COPD (chronic obstructive pulmonary disease) ([place])   . chronic diastolic CHF [id number]  . dialysis 2006  . Depression   . Anxiety   . Arthritis   . Headache   . Anemia     PAST SURGICAL HISTORY:   Past Surgical History  Procedure Laterality Date  . Cholecystectomy    . Appendectomy    . Abdominal hysterectomy    . Dialysis fistula creation Left     upper arm  . Esophagogastroduodenoscopy N/A 03/08/2015    Procedure: ESOPHAGOGASTRODUODENOSCOPY (EGD);  Surgeon: Manya Silvas, MD;  Location: [facility] ENDOSCOPY;  Service: Endoscopy;  Laterality: N/A;  . Cardiac catheterization Left 07/26/2015    Procedure: Left Heart Cath and Coronary Angiography;  Surgeon: Dionisio , MD;  Location: [place] CV LAB;  Service: Cardiovascular;  Laterality: Left;  Marland Kitchen Eye surgery    . Fecal transplant N/A 08/23/2015    Procedure: FECAL TRANSPLANT;  Surgeon: Manya Silvas, MD;  Location: [facility] ENDOSCOPY;  Service: Endoscopy;  Laterality: N/A;    SOCIAL HISTORY:   Social History  Substance Use Topics  . Smoking status: Current Some Day Smoker -- 0.50 packs/day    Types: Cigarettes    Last Attempt to Quit: 02/13/2015  . Smokeless tobacco: Never Used  . Alcohol Use: No    FAMILY HISTORY:   Family History  Problem Relation Age of Onset  . Kidney disease Mother   .  Diabetes Mother     DRUG ALLERGIES:   Allergies  Allergen Reactions  . Compazine [Prochlorperazine Edisylate] Anaphylaxis and Nausea And Vomiting  . Ace Inhibitors Swelling  . Ativan [Lorazepam] Other (See Comments)    Reaction:  Hallucinations and headaches  . Codeine Nausea And Vomiting  . Gabapentin Other (See Comments)    Reaction:  Unknown   . Losartan Other (See Comments)    Reaction:  Unknown   . Ondansetron Other (See Comments)    Reaction:  Unknown   . Prochlorperazine Other (See Comments)    Reaction:  Unknown   . Scopolamine Other (See Comments)    Reaction:  Unknown   . Zofran  [Ondansetron Hcl] Other (See Comments)    Reaction:  hallucinations   . Oxycodone Anxiety  . Tape Rash    MEDICATIONS AT HOME:   Prior to Admission medications   Medication Sig Start Date End Date Taking? Authorizing Provider  amLODipine (NORVASC) 10 MG tablet Take 10 mg by mouth daily.    Yes Historical Provider, MD  aspirin EC 81 MG tablet Take 81 mg by mouth daily.   Yes Historical Provider, MD  atorvastatin (LIPITOR) 20 MG tablet Take 20 mg by mouth every evening.   Yes Historical Provider, MD  b complex-vitamin c-folic acid (NEPHRO-VITE) 0.8 MG TABS tablet Take 1 tablet by mouth daily.   Yes Historical Provider, MD  carvedilol (COREG) 25 MG tablet Take 25 mg by mouth 2 (two) times daily.   Yes Historical Provider, MD  cetirizine (ZYRTEC) 10 MG tablet Take 10 mg by mouth daily.   Yes Historical Provider, MD  Cholecalciferol (VITAMIN D3) 400 UNITS tablet Take 400 Units by mouth daily.   Yes Historical Provider, MD  citalopram (CELEXA) 20 MG tablet Take 20 mg by mouth daily.   Yes Historical Provider, MD  clopidogrel (PLAVIX) 75 MG tablet Take 75 mg by mouth daily.   Yes Historical Provider, MD  glucose 4 GM chewable tablet Chew 3-4 tablets by mouth as needed for low blood sugar (if pts blood sugar is less than 60).   Yes Historical Provider, MD  HYDROcodone-acetaminophen (NORCO/VICODIN) 5-325 MG per tablet Take 1 tablet by mouth 3 (three) times daily as needed for severe pain.    Yes Historical Provider, MD  hydrocortisone (ANUSOL-HC) 25 MG suppository Place 25 mg rectally 2 (two) times daily.   Yes Historical Provider, MD  metoCLOPramide (REGLAN) 5 MG tablet Take 1 tablet (5 mg total) by mouth 4 (four) times daily -  before meals and at bedtime. 02/24/15  Yes Srikar Sudini, MD  pregabalin (LYRICA) 25 MG capsule Take 25 mg by mouth 2 (two) times daily as needed (for leg pain).   Yes Historical Provider, MD  promethazine (PHENERGAN) 12.5 MG tablet Take 12.5 mg by mouth every 6 (six) hours as  needed for nausea or vomiting.   Yes Historical Provider, MD  ranitidine (ZANTAC) 150 MG tablet Take 150 mg by mouth daily.    Yes Historical Provider, MD  ranolazine (RANEXA) 1000 MG SR tablet Take 1,000 mg by mouth 2 (two) times daily.   Yes Historical Provider, MD  vitamin B-12 (CYANOCOBALAMIN) 1000 MCG tablet Take 1,000 mcg by mouth daily.   Yes Historical Provider, MD  acidophilus (RISAQUAD) CAPS capsule Take 1 capsule by mouth daily. 07/25/15   Bettey Costa, MD  albuterol (PROVENTIL HFA;VENTOLIN HFA) 108 (90 BASE) MCG/ACT inhaler Inhale 4-6 puffs by mouth every 4 hours as needed for wheezing, cough, and/or shortness  of breath Patient not taking: Reported on 08/31/2015 02/17/15   Hinda Kehr, MD  albuterol (PROVENTIL) (2.5 MG/3ML) 0.083% nebulizer solution Take 2.5 mg by nebulization every 4 (four) hours as needed for wheezing or shortness of breath.    Historical Provider, MD  amiodarone (PACERONE) 200 MG tablet Take 400 mg by mouth daily.    Historical Provider, MD  amoxicillin-clavulanate (AUGMENTIN) 500-125 MG tablet Take 1 tablet (500 mg total) by mouth daily. 09/03/15   Demetrios Loll, MD  docusate sodium (COLACE) 100 MG capsule Take 1 capsule (100 mg total) by mouth 2 (two) times daily. Patient not taking: Reported on 08/31/2015 06/07/15   [name], MD  guaiFENesin-dextromethorphan (ROBITUSSIN DM) 100-10 MG/5ML syrup Take 10 mLs by mouth every 6 (six) hours as needed for cough. Patient not taking: Reported on 08/31/2015 05/24/15   Gladstone Lighter, MD  insulin aspart (NOVOLOG) 100 UNIT/ML injection Inject into the skin 3 (three) times daily before meals. Pt uses as needed per sliding scale.    Historical Provider, MD  insulin detemir (LEVEMIR) 100 UNIT/ML injection Inject 6 Units into the skin at bedtime.    Historical Provider, MD  lipase/protease/amylase (CREON) 12000 UNITS CPEP capsule Take 12,000 Units by mouth 3 (three) times daily with meals.     Historical Provider, MD  meclizine  (ANTIVERT) 25 MG tablet Take 25 mg by mouth 2 (two) times daily as needed for dizziness.    Historical Provider, MD  metoprolol (LOPRESSOR) 50 MG tablet Take 50 mg by mouth 2 (two) times daily.    Historical Provider, MD  metroNIDAZOLE (FLAGYL) 500 MG tablet Take 1 tablet (500 mg total) by mouth every 8 (eight) hours. 09/03/15   Demetrios Loll, MD  nitroGLYCERIN (NITROSTAT) 0.4 MG SL tablet Place 0.4 mg under the tongue every 5 (five) minutes x 3 doses as needed for chest pain. *Max 3 doses per episode*    Historical Provider, MD  Omega-3 Fatty Acids (FISH OIL) 1000 MG CAPS Take 1,000 mg by mouth daily.    Historical Provider, MD  ondansetron (ZOFRAN ODT) 4 MG disintegrating tablet Take 1 tablet (4 mg total) by mouth every 8 (eight) hours as needed for nausea or vomiting. 06/14/15   Earleen Newport, MD  polyethylene glycol (MIRALAX / Floria Raveling) packet Take 17 g by mouth daily as needed for mild constipation.    Historical Provider, MD  prochlorperazine (COMPAZINE) 5 MG tablet Take 1 tablet (5 mg total) by mouth every 6 (six) hours as needed for nausea or vomiting. Patient not taking: Reported on 08/31/2015 06/14/15 06/13/16  Earleen Newport, MD  promethazine (PHENERGAN) 25 MG suppository Place 1 suppository (25 mg total) rectally every 8 (eight) hours as needed for nausea or vomiting. 06/07/15   [name], MD  sevelamer carbonate (RENVELA) 800 MG tablet Take 1,600 mg by mouth 3 (three) times daily with meals.     Historical Provider, MD  sucralfate (CARAFATE) 1 G tablet Take 1 tablet (1 g total) by mouth 4 (four) times daily -  with meals and at bedtime. Patient not taking: Reported on 08/31/2015 02/24/15   [name], MD    REVIEW OF SYSTEMS:  Review of Systems  Constitutional: Negative for fever, chills, weight loss and malaise/fatigue.  HENT: Negative for ear pain, hearing loss and tinnitus.   Eyes: Negative for blurred vision, double vision, pain and redness.  Respiratory: Negative for  cough, hemoptysis and shortness of breath.   Cardiovascular: Negative for chest pain, palpitations, orthopnea and leg  swelling.  Gastrointestinal: Negative for nausea, vomiting, abdominal pain, diarrhea and constipation.  Genitourinary: Negative for dysuria, frequency and hematuria.  Musculoskeletal: Negative for back pain, joint pain and neck pain.  Skin:       No acne, rash, or lesions  Neurological: Positive for loss of consciousness. Negative for dizziness, tremors, focal weakness and weakness.  Endo/Heme/Allergies: Negative for polydipsia. Does not bruise/bleed easily.  Psychiatric/Behavioral: Negative for depression. The patient is not nervous/anxious and does not have insomnia.      VITAL SIGNS:   Filed Vitals:   10/03/15 0138 10/03/15 0148 10/03/15 0152 10/03/15 0210  BP: 74/55 106/94 88/61 102/41  Pulse: 70 70 70 70  Resp:    22  SpO2: 96% 100% 98% 98%   Wt Readings from Last 3 Encounters:  09/03/15 79 kg (174 lb 2.6 oz)  08/23/15 78.019 kg (172 lb)  07/25/15 77.9 kg (171 lb 11.8 oz)    PHYSICAL EXAMINATION:  Physical Exam  Vitals reviewed. Constitutional: She is oriented to person, place, and time. She appears well-developed and well-nourished. No distress.  HENT:  Head: Normocephalic and atraumatic.  Mouth/Throat: Oropharynx is clear and moist.  Eyes: Conjunctivae and EOM are normal. Pupils are equal, round, and reactive to light. No scleral icterus.  Neck: Normal range of motion. Neck supple. No JVD present. No thyromegaly present.  Cardiovascular: Intact distal pulses.  Exam reveals no gallop and no friction rub.   No murmur heard. Paced rate, EKG initially showed bradycardic junctional rhythm  Respiratory: Effort normal and breath sounds normal. No respiratory distress. She has no wheezes. She has no rales.  GI: Soft. Bowel sounds are normal. She exhibits no distension. There is no tenderness.  Musculoskeletal: Normal range of motion. She exhibits no edema.  No  arthritis, no gout  Lymphadenopathy:    She has no cervical adenopathy.  Neurological: She is alert and oriented to person, place, and time. No cranial nerve deficit.  No dysarthria, no aphasia  Skin: Skin is warm and dry. No rash noted. No erythema.  Psychiatric: She has a normal mood and affect. Her behavior is normal. Judgment and thought content normal.    LABORATORY PANEL:   CBC  Recent Labs Lab 10/03/15 0116  WBC 5.7  HGB 11.1*  HCT 34.4*  PLT 202   ------------------------------------------------------------------------------------------------------------------  Chemistries   Recent Labs Lab 10/03/15 0116  NA 133*  K 6.6*  CL 96*  CO2 25  GLUCOSE 142*  BUN 37*  CREATININE 6.50*  CALCIUM 8.3*  AST 43*  ALT 17  ALKPHOS 507*  BILITOT 1.1   ------------------------------------------------------------------------------------------------------------------  Cardiac Enzymes  Recent Labs Lab 10/03/15 0116  TROPONINI 0.03   ------------------------------------------------------------------------------------------------------------------  RADIOLOGY:  Dg Chest Port 1 View  10/03/2015  CLINICAL DATA:  55 year old female with respiratory distress. EXAM: PORTABLE CHEST 1 VIEW COMPARISON:  Radiograph dated 09/01/2015 FINDINGS: Two views of the chest demonstrate bilateral small to moderate pleural effusions with associated partial compressive atelectasis of the lower lobes. Pneumonia is not excluded. Stable cardiomegaly with central vascular prominence likely congestive changes. No pneumothorax. Left subclavian and axillary vascular stent is again noted. IMPRESSION: Congestive changes with small to moderate bilateral pleural effusions. Superimposed pneumonia is not excluded. Clinical correlation and follow-up recommended. Electronically Signed   By: Anner Crete M.D.   On: 10/03/2015 01:49    EKG:   Orders placed or performed during the hospital encounter of  10/03/15  . EKG 12-Lead  . EKG 12-Lead    IMPRESSION AND  PLAN:  Principal Problem:   Unresponsiveness - patient has become responsive again now that she is paced and on vasopressor. We will continue her transcutaneous pacing and pressor support for hemodynamic stability this time. Unresponsiveness was likely due to her cardiac issue which is likely instigated by her hyperkalemia. See below for further treatment of the same. Active Problems:   End-stage renal disease on hemodialysis [facility]) - nephrology consult as below for dialysis   Bradycardia - junctional rhythm requiring transcutaneous pacing. Cardiology called by ED provider, at ready if patient needs transvenous pacer. Patient will be nothing by mouth for now in case she needs some procedure.   Hyperkalemia - patient was given insulin and albuterol and bicarbonate, mom then placed on a bicarbonate drip. Potassium 6.6. Nephrology called for urgent dialysis by ED provider.   DM (diabetes mellitus) ([place]) - correctional insulin with glucose checks every 6 hours while nothing by mouth and hypoglycemic protocol.   HTN (hypertension) - hypotensive on pressors at this time. Hold all antihypertensive medications   Coronary artery disease involving native coronary artery of native heart without angina pectoris - continue home meds for this, though somebody been held at this time due to her hypotension. She will need revision of all her home meds once her condition stabilizes.  All the records are reviewed and case discussed with ED provider. Management plans discussed with the patient and/or family.  DVT PROPHYLAXIS: SubQ heparin  GI PROPHYLAXIS: None, the patient is on H2 blocker at home that may be restarted once she is taking by mouth again  ADMISSION STATUS: Inpatient  CODE STATUS: Full Code Status History    Date Active Date Inactive Code Status Order ID Comments User Context   08/31/2015  4:00 PM 09/03/2015 10:09 PM Full Code [id number]   Demetrios Loll, MD ED   07/26/2015  3:28 PM 07/26/2015  9:36 PM Full Code [id number]  Dionisio Jaylaa Gallion, MD Inpatient   07/22/2015  5:36 PM 07/26/2015  3:28 PM Full Code [id number]  Vaughan Basta, MD Inpatient   06/05/2015  8:39 PM 06/07/2015  8:03 PM Full Code [id number]  Bettey Costa, MD Inpatient   05/21/2015  9:42 PM 05/24/2015  4:26 PM Full Code [id number]  Henreitta Leber, MD Inpatient   04/24/2015  8:55 AM 04/26/2015 10:11 PM Full Code [id number]  Demetrios Loll, MD Inpatient   04/01/2015  3:58 PM 04/05/2015  8:07 PM Full Code [id number]  Epifanio Lesches, MD ED   03/06/2015  8:05 AM 03/09/2015  9:11 PM Full Code [id number]  Juluis Mire, MD Inpatient   02/19/2015  4:14 PM 02/24/2015  5:41 PM Full Code [id number]  Aldean Jewett, MD Inpatient      TOTAL CRITICAL CARE TIME TAKING CARE OF THIS PATIENT: 50 minutes.    Trude Cansler Donora 10/03/2015, 2:27 AM  [facility]  Office  [phone]  CC: Primary care physician; Ellamae Sia, MD

## 2015-10-03 NOTE — Progress Notes (Signed)
[name] is a 55 y.o. female  [id number]  Primary Cardiologist: Neoma Laming Reason for Consultation: Complete Av block  HPI: 78 YOBF presented to ER unresponsive, and found to be in heart rate 36 with junctional rhythm. She got atropine, dopamine and external pacemaker and BP is 110/70 now , alert with not much symptomatic with external pacemaker. K is 6.5 and missed dialysis as was sick.   Review of Systems: No chest pains    Past Medical History  Diagnosis Date  . Asthma   . Collagen vascular disease ([place])   . Diabetes mellitus without complication ([place])   . Hypertension   . Coronary artery disease     a. cath 2013: stenting to RCA (report not available); b. cath 2014: LM nl, pLAD 40%, mLAD nl, ost LCx 40%, mid LCx nl, pRCA 30% @ site of prior stent, mRCA 50%  . Diabetic neuropathy ([place])   . ESRD (end stage renal disease) on dialysis ([place])     M-W-F  . GERD (gastroesophageal reflux disease)   . Hx of pancreatitis 2015  . Myocardial infarction ([place])   . Pneumonia   . Mitral regurgitation     a. echo 10/2013: EF 62%, noWMA, mildly dilated LA, mild to mod MR/TR, GR1DD  . COPD (chronic obstructive pulmonary disease) ([place])   . chronic diastolic CHF [id number]  . dialysis 2006  . Depression   . Anxiety   . Arthritis   . Headache   . Anemia      (Not in a hospital admission)   . albuterol  2.5 mg Nebulization STAT  . insulin aspart      . ondansetron        Infusions: . DOPamine 20 mcg/kg/min (10/03/15 0129)  .  sodium bicarbonate  infusion 1000 mL    . sodium chloride      Allergies  Allergen Reactions  . Compazine [Prochlorperazine Edisylate] Anaphylaxis and Nausea And Vomiting  . Ace Inhibitors Swelling  . Ativan [Lorazepam] Other (See Comments)    Reaction:  Hallucinations and headaches  . Codeine Nausea And Vomiting  . Gabapentin Other (See Comments)    Reaction:  Unknown   . Losartan Other (See Comments)    Reaction:  Unknown   . Ondansetron  Other (See Comments)    Reaction:  Unknown   . Prochlorperazine Other (See Comments)    Reaction:  Unknown   . Scopolamine Other (See Comments)    Reaction:  Unknown   . Zofran [Ondansetron Hcl] Other (See Comments)    Reaction:  hallucinations   . Oxycodone Anxiety  . Tape Rash    Social History   Social History  . Marital Status: Divorced    Spouse Name: N/A  . Number of Children: N/A  . Years of Education: N/A   Occupational History  . Not on file.   Social History Main Topics  . Smoking status: Current Some Day Smoker -- 0.50 packs/day    Types: Cigarettes    Last Attempt to Quit: 02/13/2015  . Smokeless tobacco: Never Used  . Alcohol Use: No  . Drug Use: No  . Sexual Activity: Not on file   Other Topics Concern  . Not on file   Social History Narrative    Family History  Problem Relation Age of Onset  . Kidney disease Mother   . Diabetes Mother     PHYSICAL EXAM: Filed Vitals:   10/03/15 0152 10/03/15 0210  BP: 88/61 102/41  Pulse: 70 70  Resp:  22    No intake or output data in the 24 hours ending 10/03/15 0227  General:  Well appearing. No respiratory difficulty HEENT: normal Neck: supple. no JVD. Carotids 2+ bilat; no bruits. No lymphadenopathy or thryomegaly appreciated. Cor: PMI nondisplaced. Regular rate & rhythm. No rubs, gallops or murmurs. Lungs: clear Abdomen: soft, nontender, nondistended. No hepatosplenomegaly. No bruits or masses. Good bowel sounds. Extremities: no cyanosis, clubbing, rash, edema Neuro: alert & oriented x 3, cranial nerves grossly intact. moves all 4 extremities w/o difficulty. Affect pleasant.  ECG: junctional rhythm, 36/min  Results for orders placed or performed during the hospital encounter of 10/03/15 (from the past 24 hour(s))  CBC with Differential     Status: Abnormal (Preliminary result)   Collection Time: 10/03/15  1:16 AM  Result Value Ref Range   WBC 5.7 3.6 - 11.0 K/uL   RBC 4.10 3.80 - 5.20 MIL/uL    Hemoglobin 11.1 (L) 12.0 - 16.0 g/dL   HCT 34.4 (L) 35.0 - 47.0 %   MCV 83.9 80.0 - 100.0 fL   MCH 27.1 26.0 - 34.0 pg   MCHC 32.3 32.0 - 36.0 g/dL   RDW 22.0 (H) 11.5 - 14.5 %   Platelets 202 150 - 440 K/uL   Neutrophils Relative % PENDING %   Neutro Abs PENDING 1.7 - 7.7 K/uL   Band Neutrophils PENDING %   Lymphocytes Relative PENDING %   Lymphs Abs PENDING 0.7 - 4.0 K/uL   Monocytes Relative PENDING %   Monocytes Absolute PENDING 0.1 - 1.0 K/uL   Eosinophils Relative PENDING %   Eosinophils Absolute PENDING 0.0 - 0.7 K/uL   Basophils Relative PENDING %   Basophils Absolute PENDING 0.0 - 0.1 K/uL   WBC Morphology PENDING    RBC Morphology PENDING    Smear Review PENDING    Other PENDING %   nRBC PENDING 0 /100 WBC   Metamyelocytes Relative PENDING %   Myelocytes PENDING %   Promyelocytes Absolute PENDING %   Blasts PENDING %  Comprehensive metabolic panel     Status: Abnormal   Collection Time: 10/03/15  1:16 AM  Result Value Ref Range   Sodium 133 (L) 135 - 145 mmol/L   Potassium 6.6 (HH) 3.5 - 5.1 mmol/L   Chloride 96 (L) 101 - 111 mmol/L   CO2 25 22 - 32 mmol/L   Glucose, Bld 142 (H) 65 - 99 mg/dL   BUN 37 (H) 6 - 20 mg/dL   Creatinine, Ser 6.50 (H) 0.44 - 1.00 mg/dL   Calcium 8.3 (L) 8.9 - 10.3 mg/dL   Total Protein 6.9 6.5 - 8.1 g/dL   Albumin 3.0 (L) 3.5 - 5.0 g/dL   AST 43 (H) 15 - 41 U/L   ALT 17 14 - 54 U/L   Alkaline Phosphatase 507 (H) 38 - 126 U/L   Total Bilirubin 1.1 0.3 - 1.2 mg/dL   GFR calc non Af Amer 6 (L) >60 mL/min   GFR calc Af Amer 7 (L) >60 mL/min   Anion gap 12 5 - 15  Lipase, blood     Status: None   Collection Time: 10/03/15  1:16 AM  Result Value Ref Range   Lipase 18 11 - 51 U/L  Troponin I     Status: None   Collection Time: 10/03/15  1:16 AM  Result Value Ref Range   Troponin I 0.03 <0.031 ng/mL  CK     Status:  Abnormal   Collection Time: 10/03/15  1:16 AM  Result Value Ref Range   Total CK 30 (L) 38 - 234 U/L   Acetaminophen level     Status: Abnormal   Collection Time: 10/03/15  1:16 AM  Result Value Ref Range   Acetaminophen (Tylenol), Serum <10 (L) 10 - 30 ug/mL  Salicylate level     Status: None   Collection Time: 10/03/15  1:16 AM  Result Value Ref Range   Salicylate Lvl 123456 2.8 - 30.0 mg/dL  Brain natriuretic peptide     Status: Abnormal   Collection Time: 10/03/15  1:16 AM  Result Value Ref Range   B Natriuretic Peptide 948.0 (H) 0.0 - 100.0 pg/mL  Ammonia     Status: Abnormal   Collection Time: 10/03/15  1:16 AM  Result Value Ref Range   Ammonia 67 (H) 9 - 35 umol/L  Blood gas, venous     Status: Abnormal (Preliminary result)   Collection Time: 10/03/15  1:30 AM  Result Value Ref Range   pH, Ven 7.28 (L) 7.320 - 7.430   pCO2, Ven 57 44.0 - 60.0 mmHg   pO2, Ven PENDING 30.0 - 45.0 mmHg   Bicarbonate 26.8 21.0 - 28.0 mEq/L   Acid-base deficit 1.0 0.0 - 2.0 mmol/L   Patient temperature 37.0    Collection site VENOUS    Sample type VENOUS   Glucose, capillary     Status: Abnormal   Collection Time: 10/03/15  1:31 AM  Result Value Ref Range   Glucose-Capillary 213 (H) 65 - 99 mg/dL   Dg Chest Port 1 View  10/03/2015  CLINICAL DATA:  55 year old female with respiratory distress. EXAM: PORTABLE CHEST 1 VIEW COMPARISON:  Radiograph dated 09/01/2015 FINDINGS: Two views of the chest demonstrate bilateral small to moderate pleural effusions with associated partial compressive atelectasis of the lower lobes. Pneumonia is not excluded. Stable cardiomegaly with central vascular prominence likely congestive changes. No pneumothorax. Left subclavian and axillary vascular stent is again noted. IMPRESSION: Congestive changes with small to moderate bilateral pleural effusions. Superimposed pneumonia is not excluded. Clinical correlation and follow-up recommended. Electronically Signed   By: Anner Crete M.D.   On: 10/03/2015 01:49     ASSESSMENT AND PLAN: Junctional rhythm, doing well on  current treatmet, and needs dialysisis as K 6.5. Renal is setting up, will put transvenous PM if  And will set up everything but i think after dialsysis she should be fine.  Nissan Frazzini A

## 2015-10-03 NOTE — Progress Notes (Signed)
Subjective:  Patient known to our practice from outpatient She is followed by Dr Juleen China at [facility]. MWF-1 She presented to ER via EMS with resp distress.  She was unresponsive. Given 2 rounds of narcan She was also noted to be bradycardic therefore Atropine was administer She empirically treated for hyperkalemia and emergent HD was requested once K level was noted to be critically high  She did undergo emergent treatment overnight This morning she is still sleepy after she received Phenergan Per nurse, she was talking earlier    HEMODIALYSIS FLOWSHEET:  Blood Flow Rate (mL/min): 400 mL/min Arterial Pressure (mmHg): -130 mmHg Venous Pressure (mmHg): 200 mmHg Transmembrane Pressure (mmHg): 60 mmHg Ultrafiltration Rate (mL/min): 820 mL/min Dialysate Flow Rate (mL/min): 810 ml/min Conductivity: Machine : 14 Conductivity: Machine : 14 Intra-Hemodialysis Comments: 3000 (PT RMVD 2.5KG)     Objective:  Vital signs in last 24 hours:  Temp:  [96.6 F (35.9 C)-98.2 F (36.8 C)] 97.5 F (36.4 C) (01/11 0759) Pulse Rate:  [32-97] 77 (01/11 0800) Resp:  [13-24] 14 (01/11 0800) BP: (57-134)/(38-94) 134/62 mmHg (01/11 0900) SpO2:  [93 %-100 %] 98 % (01/11 0800) Weight:  [82.9 kg (182 lb 12.2 oz)] 82.9 kg (182 lb 12.2 oz) (01/11 0500)  Weight change:  Filed Weights   10/03/15 0500  Weight: 82.9 kg (182 lb 12.2 oz)    Intake/Output:    Intake/Output Summary (Last 24 hours) at 10/03/15 0951 Last data filed at 10/03/15 0900  Gross per 24 hour  Intake  789.2 ml  Output  -2500 ml  Net 3289.2 ml     Physical Exam: General: NAD  HEENT Moist oral mucus membranes  Neck supple  Pulm/lungs Normal effort, clear to auscultation  CVS/Heart Regular, no rub  Abdomen:  Soft, non tender  Extremities: + peripheral edema  Neurologic: lethargic  Skin: No acute rashes  Access: Left arm AVF       Basic Metabolic Panel:   Recent Labs Lab 10/03/15 0116 10/03/15 0445   NA 133* 134*  K 6.6* 5.5*  CL 96* 96*  CO2 25 29  GLUCOSE 142* 55*  BUN 37* 36*  CREATININE 6.50* 6.50*  CALCIUM 8.3* 8.6*     CBC:  Recent Labs Lab 10/03/15 0116 10/03/15 0445  WBC 5.7 8.8  NEUTROABS 3.8  --   HGB 11.1* 10.6*  HCT 34.4* 32.3*  MCV 83.9 85.1  PLT 202 213      Microbiology:  Recent Results (from the past 720 hour(s))  MRSA PCR Screening     Status: None   Collection Time: 10/03/15  4:00 AM  Result Value Ref Range Status   MRSA by PCR NEGATIVE NEGATIVE Final    Comment:        The GeneXpert MRSA Assay (FDA approved for NASAL specimens only), is one component of a comprehensive MRSA colonization surveillance program. It is not intended to diagnose MRSA infection nor to guide or monitor treatment for MRSA infections.     Coagulation Studies: No results for input(s): LABPROT, INR in the last 72 hours.  Urinalysis: No results for input(s): COLORURINE, LABSPEC, PHURINE, GLUCOSEU, HGBUR, BILIRUBINUR, KETONESUR, PROTEINUR, UROBILINOGEN, NITRITE, LEUKOCYTESUR in the last 72 hours.  Invalid input(s): APPERANCEUR    Imaging: Dg Chest Port 1 View  10/03/2015  CLINICAL DATA:  55 year old female with respiratory distress. EXAM: PORTABLE CHEST 1 VIEW COMPARISON:  Radiograph dated 09/01/2015 FINDINGS: Two views of the chest demonstrate bilateral small to moderate pleural effusions with associated partial compressive  atelectasis of the lower lobes. Pneumonia is not excluded. Stable cardiomegaly with central vascular prominence likely congestive changes. No pneumothorax. Left subclavian and axillary vascular stent is again noted. IMPRESSION: Congestive changes with small to moderate bilateral pleural effusions. Superimposed pneumonia is not excluded. Clinical correlation and follow-up recommended. Electronically Signed   By: Anner Crete M.D.   On: 10/03/2015 01:49     Medications:   . DOPamine 16 mcg/kg/min (10/03/15 0843)  .  sodium bicarbonate   infusion 1000 mL 200 mL/hr at 10/03/15 0841   . albuterol  2.5 mg Nebulization STAT  . aspirin EC  81 mg Oral Daily  . atorvastatin  20 mg Oral QPM  . citalopram  20 mg Oral Daily  . clopidogrel  75 mg Oral Daily  . heparin  5,000 Units Subcutaneous 3 times per day  . insulin aspart  0-9 Units Subcutaneous Q6H  . ondansetron      . ranolazine  1,000 mg Oral BID  . sevelamer carbonate  1,600 mg Oral TID WC  . sodium chloride  3 mL Intravenous Q12H   promethazine  Assessment/ Plan:  55 y.o. 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 admitted for bradycardia and hyperkalemia    [facility] MWF [facility]  1. End Stage Renal Disease: with Hyperkalemia and pulm edema - emergent HD last night - low K bath - 3000 cc of fluid was removed - repeat K 2 hrs post HD - d/c iv bicarb  2. Anemia of chronic kidney disease:  - hemoglobin currently 10.6 - low dose EPO with HD  3. Secondary Hyperparathyroidism:  Continue Renvela 1600 mg by mouth 3 times a day once able to eat normal diet   LOS: 0 Tewana Bohlen 1/11/20179:51 AM

## 2015-10-03 NOTE — Progress Notes (Signed)
Updated Dr. Posey Pronto- about low blood sugar-gave amp of D50- recheck sugar 126. Order to start dextrose at 81ml/hr.

## 2015-10-03 NOTE — Code Documentation (Addendum)
Pt unresonsive, loss of verbal communication, Pulse check with monitor, heart rate @ 26bpm, O2 at dropped to 82%. Non-Re-breather applied to pt.

## 2015-10-03 NOTE — ED Notes (Signed)
CBG 88 

## 2015-10-03 NOTE — ED Notes (Addendum)
Pt Emergency traffic by EMS with respiratory distress. Pt is dialysis pt, non compliant with Mondays dialysis appointment. Pt was unresponsive, per EMS. 2 of narcan given on scene along with 0.5 Atropine due to bradycardia. Pt c/o SOB, EMS applied Cpap. EMS reports 22g in left [facility]. Pt verbal on arrival with HR of 32 bpm.

## 2015-10-03 NOTE — Progress Notes (Signed)
Patient has moved to ICU and heart rate is around 60 sinus bradycardia with first-degree AV block and blood pressure is 110/70. Patient is mentating well and is scheduled to have urgent dialysis by Dr. Candiss Norse.

## 2015-10-03 NOTE — Code Documentation (Signed)
Pads applied, Paced rhythm captured & initiated.

## 2015-10-03 NOTE — Progress Notes (Addendum)
[place] at [place] NAME: [name]    MR#:  [id number]  DATE OF BIRTH:  July 11, 1961  SUBJECTIVE:  Came in unresponsive, hypotensive and bradycaridic with elevated K  REVIEW OF SYSTEMS:   Review of Systems  Unable to perform ROS: critical illness   DRUG ALLERGIES:   Allergies  Allergen Reactions  . Compazine [Prochlorperazine Edisylate] Anaphylaxis and Nausea And Vomiting  . Ace Inhibitors Swelling  . Ativan [Lorazepam] Other (See Comments)    Reaction:  Hallucinations and headaches  . Codeine Nausea And Vomiting  . Gabapentin Other (See Comments)    Reaction:  Unknown   . Losartan Other (See Comments)    Reaction:  Unknown   . Ondansetron Other (See Comments)    Reaction:  Unknown   . Prochlorperazine Other (See Comments)    Reaction:  Unknown   . Scopolamine Other (See Comments)    Reaction:  Unknown   . Zofran [Ondansetron Hcl] Other (See Comments)    Reaction:  hallucinations   . Oxycodone Anxiety  . Tape Rash    VITALS:  Blood pressure 111/93, pulse 63, temperature 96.6 F (35.9 C), temperature source Axillary, resp. rate 17, height 5\' 1"  (1.549 m), weight 82.9 kg (182 lb 12.2 oz), SpO2 100 %.  PHYSICAL EXAMINATION:  GENERAL:  55 y.o.-year-old patient lying in the bed with no acute distress. Critically ill EYES: Pupils equal, round, reactive to light and accommodation. No scleral icterus. Extraocular muscles intact.  HEENT: Head atraumatic, normocephalic. Oropharynx and nasopharynx clear.  NECK:  Supple, no jugular venous distention. No thyroid enlargement, no tenderness.  LUNGS: Normal breath sounds bilaterally, no wheezing, rales,rhonchi or crepitation. No use of accessory muscles of respiration.  CARDIOVASCULAR: S1, S2 normal. No murmurs, rubs, or gallops.  ABDOMEN: Soft, nontender, nondistended. Bowel sounds present. No organomegaly or mass.  EXTREMITIES: No pedal edema, cyanosis, or clubbing.   NEUROLOGIC:unable to assess lethargicPSYCHIATRIC:lethargic.  SKIN: No obvious rash, lesion, or ulcer.    LABORATORY PANEL:   CBC  Recent Labs Lab 10/03/15 0116 10/03/15 0445  WBC 5.7 8.8  HGB 11.1* 10.6*  HCT 34.4* 32.3*  PLT 202 213   ------------------------------------------------------------------------------------------------------------------  Chemistries   Recent Labs Lab 10/03/15 0116 10/03/15 0445  NA 133* 134*  K 6.6* 5.5*  CL 96* 96*  CO2 25 29  GLUCOSE 142* 55*  BUN 37* 36*  CREATININE 6.50* 6.50*  CALCIUM 8.3* 8.6*  AST 43*  --   ALT 17  --   ALKPHOS 507*  --   BILITOT 1.1  --    ------------------------------------------------------------------------------------------------------------------  Cardiac Enzymes  Recent Labs Lab 10/03/15 0116 10/03/15 0444  TROPONINI 0.03 0.03   ------------------------------------------------------------------------------------------------------------------  RADIOLOGY:  Dg Chest Port 1 View  10/03/2015  CLINICAL DATA:  55 year old female with respiratory distress. EXAM: PORTABLE CHEST 1 VIEW COMPARISON:  Radiograph dated 09/01/2015 FINDINGS: Two views of the chest demonstrate bilateral small to moderate pleural effusions with associated partial compressive atelectasis of the lower lobes. Pneumonia is not excluded. Stable cardiomegaly with central vascular prominence likely congestive changes. No pneumothorax. Left subclavian and axillary vascular stent is again noted. IMPRESSION: Congestive changes with small to moderate bilateral pleural effusions. Superimposed pneumonia is not excluded. Clinical correlation and follow-up recommended. Electronically Signed   By: Anner Crete M.D.   On: 10/03/2015 01:49     ASSESSMENT AND PLAN:  [name] is a 55 y.o. female who presents unresponsive. Patient was found unresponsive and then transported  by EMS to the ED for evaluation. She is an end-stage renal disease patient  on hemodialysis who missed her last dialysis session. Here she was found to be bradycardic with a junctional rhythm in the 30s. Her potassium was found to be 6.6  1. acute encephalopathy/Unresponsiveness  - patient has become responsive again now that she is paced and on vasopressor with dopamine. -Blood pressure much improved. Heart rate in the 60s. - We will continue her transcutaneous pacing and pressor support for hemodynamic stability this time.  -Seen by cardiology. Await further recommendations.   2.End-stage renal disease on hemodialysis ([place]) with severe hypokalemia due to missing dialysis. -Appreciated urgent nephrology consult for dialysis -Patient currently getting dialyzed. Her potassium is down from 6.6-5.5 -patient was given insulin and albuterol and bicarbonate  3. Bradycardia - junctional rhythm requiring transcutaneous pacing.  -Continue transvenous pacer.   4. Severe hypotension. Blood pressure was in the 123456 to 0000000 systolic -Currently on dopamine at 20 mics wean as tolerated  5. DM (diabetes mellitus) ([place]) - correctional insulin with glucose checks every 6 hours while nothing by mouth and hypoglycemic protocol. -Sugars on the lower side. -Renal diet  6. HTN (hypertension) - hypotensive on pressors at this time. Hold all antihypertensive medications  7. Coronary artery disease involving native coronary artery of native heart without angina pectoris - continue home meds for this, though somebody been held at this time due to her hypotension.   8. Intractable nausea -Patient prefers when necessary Phenergan  All the records are reviewed and case discussed with Care Management/Social Workerr. Management plans discussed with the patient & in agreement.  CODE STATUS: Full  TOTAL CRITICAL TIME TAKING CARE OF THIS PATIENT: 35 minutes.   POSSIBLE D/C IN few DAYS, DEPENDING ON CLINICAL CONDITION.   Kalieb Freeland M.D on 10/03/2015 at 7:32 AM  Between 7am to 6pm -  Pager - [phone]  After 6pm go to [email or web address] - password [email or web address] [place] [facility]  Office  [phone]  CC: Primary care physician; Ellamae Sia, MD

## 2015-10-03 NOTE — Code Documentation (Signed)
Pulse check, Pulse at right carotid felt per Dr. Jacqualine Code. No CPR at time. HR at 32.

## 2015-10-03 NOTE — ED Notes (Signed)
CBG: 115 

## 2015-10-03 NOTE — ED Provider Notes (Signed)
[facility] Emergency Department Provider Note  ____________________________________________  Time seen: Approximately 1:35 AM  I have reviewed the triage vital signs and the nursing notes.   HISTORY  Chief Complaint Respiratory Distress  EM caveat, acuity of condition. Severe somnolence and lethargy.  HPI [name] is a 55 y.o. female history of dialysis and coronary disease.  Per paramedics there called for unresponsiveness and found the patient to be extremely obtunded. She missed her Monday dialysis. She was given Narcan and atropine and noted to have severe bradycardia. She is placed on CPAP for questionable respiratory distress. Delivered to the ER. Blood pressure was normotensive with EMS on arrival, currently on CPAP and they do report her mentation is improving.  Past Medical History  Diagnosis Date  . Asthma   . Collagen vascular disease ([place])   . Diabetes mellitus without complication ([place])   . Hypertension   . Coronary artery disease     a. cath 2013: stenting to RCA (report not available); b. cath 2014: LM nl, pLAD 40%, mLAD nl, ost LCx 40%, mid LCx nl, pRCA 30% @ site of prior stent, mRCA 50%  . Diabetic neuropathy ([place])   . ESRD (end stage renal disease) on dialysis ([place])     M-W-F  . GERD (gastroesophageal reflux disease)   . Hx of pancreatitis 2015  . Myocardial infarction ([place])   . Pneumonia   . Mitral regurgitation     a. echo 10/2013: EF 62%, noWMA, mildly dilated LA, mild to mod MR/TR, GR1DD  . COPD (chronic obstructive pulmonary disease) ([place])   . chronic diastolic CHF 123XX123  . dialysis 2006  . Depression   . Anxiety   . Arthritis   . Headache   . Anemia     Patient Active Problem List   Diagnosis Date Noted  . Hyperkalemia 10/03/2015  . C. difficile diarrhea 07/22/2015  . Pneumonia 05/21/2015  . Hypoglycemia 04/24/2015  . Unresponsiveness 04/24/2015  . Bradycardia 04/24/2015  . Hypothermia 04/24/2015  . Acute  respiratory failure ([place]) 04/24/2015  . Acute diastolic CHF (congestive heart failure) ([place]) 04/05/2015  . Diabetic gastroparesis ([place]) 04/05/2015  . Hypokalemia 04/05/2015  . Generalized weakness 04/05/2015  . Acute pulmonary edema ([place]) 04/03/2015  . Nausea and vomiting 04/03/2015  . Hypoglycemia associated with diabetes ([place]) 04/03/2015  . Anemia of chronic disease 04/03/2015  . Secondary hyperparathyroidism ([place]) 04/03/2015  . Pressure ulcer 04/02/2015  . Acute respiratory failure with hypoxia ([place]) 04/01/2015  . Adjustment disorder with anxiety 03/14/2015  . Somatic symptom disorder, mild 03/08/2015  . Coronary artery disease involving native coronary artery of native heart without angina pectoris   . Nausea & vomiting 03/06/2015  . Abdominal pain 03/06/2015  . DM (diabetes mellitus) ([place]) 03/06/2015  . HTN (hypertension) 03/06/2015  . Gastroparesis 02/24/2015  . Pleural effusion 02/19/2015  . HCAP (healthcare-associated pneumonia) 02/19/2015  . End-stage renal disease on hemodialysis ([place]) 02/19/2015    Past Surgical History  Procedure Laterality Date  . Cholecystectomy    . Appendectomy    . Abdominal hysterectomy    . Dialysis fistula creation Left     upper arm  . Esophagogastroduodenoscopy N/A 03/08/2015    Procedure: ESOPHAGOGASTRODUODENOSCOPY (EGD);  Surgeon: Manya Silvas, MD;  Location: [facility] ENDOSCOPY;  Service: Endoscopy;  Laterality: N/A;  . Cardiac catheterization Left 07/26/2015    Procedure: Left Heart Cath and Coronary Angiography;  Surgeon: Dionisio David, MD;  Location: [facility] CV LAB;  Service: Cardiovascular;  Laterality: Left;  Marland Kitchen Eye surgery    . Fecal transplant N/A 08/23/2015    Procedure: FECAL TRANSPLANT;  Surgeon: Manya Silvas, MD;  Location: [facility] ENDOSCOPY;  Service: Endoscopy;  Laterality: N/A;    Current Outpatient Rx  Name  Route  Sig  Dispense  Refill  . amLODipine (NORVASC) 10 MG tablet   Oral   Take 10 mg by mouth daily.           Marland Kitchen aspirin EC 81 MG tablet   Oral   Take 81 mg by mouth daily.         Marland Kitchen atorvastatin (LIPITOR) 20 MG tablet   Oral   Take 20 mg by mouth every evening.         Marland Kitchen b complex-vitamin c-folic acid (NEPHRO-VITE) 0.8 MG TABS tablet   Oral   Take 1 tablet by mouth daily.         . carvedilol (COREG) 25 MG tablet   Oral   Take 25 mg by mouth 2 (two) times daily.         . cetirizine (ZYRTEC) 10 MG tablet   Oral   Take 10 mg by mouth daily.         . Cholecalciferol (VITAMIN D3) 400 UNITS tablet   Oral   Take 400 Units by mouth daily.         . citalopram (CELEXA) 20 MG tablet   Oral   Take 20 mg by mouth daily.         . clopidogrel (PLAVIX) 75 MG tablet   Oral   Take 75 mg by mouth daily.         Marland Kitchen glucose 4 GM chewable tablet   Oral   Chew 3-4 tablets by mouth as needed for low blood sugar (if pts blood sugar is less than 60).         Marland Kitchen HYDROcodone-acetaminophen (NORCO/VICODIN) 5-325 MG per tablet   Oral   Take 1 tablet by mouth 3 (three) times daily as needed for severe pain.          . hydrocortisone (ANUSOL-HC) 25 MG suppository   Rectal   Place 25 mg rectally 2 (two) times daily.         . metoCLOPramide (REGLAN) 5 MG tablet   Oral   Take 1 tablet (5 mg total) by mouth 4 (four) times daily -  before meals and at bedtime.   40 tablet   0   . nitroGLYCERIN (NITROSTAT) 0.4 MG SL tablet   Sublingual   Place 0.4 mg under the tongue every 5 (five) minutes x 3 doses as needed for chest pain. *Max 3 doses per episode*         . pregabalin (LYRICA) 25 MG capsule   Oral   Take 25 mg by mouth 2 (two) times daily as needed (for leg pain).         . promethazine (PHENERGAN) 12.5 MG tablet   Oral   Take 12.5 mg by mouth every 6 (six) hours as needed for nausea or vomiting.         . ranitidine (ZANTAC) 150 MG tablet   Oral   Take 150 mg by mouth daily.          . ranolazine (RANEXA) 1000 MG SR tablet   Oral   Take 1,000 mg by  mouth 2 (two) times daily.         . vitamin B-12 (CYANOCOBALAMIN) 1000 MCG  tablet   Oral   Take 1,000 mcg by mouth daily.         Marland Kitchen albuterol (PROVENTIL) (2.5 MG/3ML) 0.083% nebulizer solution   Nebulization   Take 2.5 mg by nebulization every 4 (four) hours as needed for wheezing or shortness of breath.         . insulin aspart (NOVOLOG) 100 UNIT/ML injection   Subcutaneous   Inject into the skin 3 (three) times daily before meals. Pt uses as needed per sliding scale.         . insulin detemir (LEVEMIR) 100 UNIT/ML injection   Subcutaneous   Inject 6 Units into the skin at bedtime.         . lipase/protease/amylase (CREON) 12000 UNITS CPEP capsule   Oral   Take 12,000 Units by mouth 3 (three) times daily with meals.          . sevelamer carbonate (RENVELA) 800 MG tablet   Oral   Take 1,600 mg by mouth 3 (three) times daily with meals.            Allergies Compazine; Ace inhibitors; Ativan; Codeine; Gabapentin; Losartan; Ondansetron; Prochlorperazine; Scopolamine; Zofran; Oxycodone; and Tape  Family History  Problem Relation Age of Onset  . Kidney disease Mother   . Diabetes Mother     Social History Social History  Substance Use Topics  . Smoking status: Current Some Day Smoker -- 0.50 packs/day    Types: Cigarettes    Last Attempt to Quit: 02/13/2015  . Smokeless tobacco: Never Used  . Alcohol Use: No    Review of Systems EM caveat  ____________________________________________   PHYSICAL EXAM:  VITAL SIGNS: ED Triage Vitals  Enc Vitals Group     BP --      Pulse --      Resp --      Temp --      Temp src --      SpO2 --      Weight --      Height --      Head Cir --      Peak Flow --      Pain Score --      Pain Loc --      Pain Edu? --      Excl. in [place]? --    Constitutional: Somnolent, arousable to stimuli. Eyes: Conjunctivae are normal. PERRL. EOMI. Head: Atraumatic. Nose: No congestion/rhinnorhea. Mouth/Throat: Mucous  membranes are moist.  Dentures Neck: No stridor.   Cardiovascular: Very bradycardic rate, regular rhythm. Grossly normal heart sounds. Peripheral circulation. Respiratory: Mild diminished respiratory effort. No wheezes or rales Gastrointestinal: Soft and nontender. No distention. No abdominal bruits. No CVA tenderness. Musculoskeletal: No lower extremity tenderness but 1+ bilateral edema.  No joint effusions. Neurologic:  Confused, disoriented, but does open eyes to voice and localizes pain. She is able to follow basic commands like wiggling her fingers and smile when directed.  Skin:  Skin is oral dry and intact. No rash noted. Psychiatric: Mood and affect are flat and withdrawn. ____________________________________________   LABS (all labs ordered are listed, but only abnormal results are displayed)  Labs Reviewed  CBC WITH DIFFERENTIAL/PLATELET - Abnormal; Notable for the following:    Hemoglobin 11.1 (*)    HCT 34.4 (*)    RDW 22.0 (*)    All other components within normal limits  COMPREHENSIVE METABOLIC PANEL - Abnormal; Notable for the following:    Sodium 133 (*)    Potassium  6.6 (*)    Chloride 96 (*)    Glucose, Bld 142 (*)    BUN 37 (*)    Creatinine, Ser 6.50 (*)    Calcium 8.3 (*)    Albumin 3.0 (*)    AST 43 (*)    Alkaline Phosphatase 507 (*)    GFR calc non Af Amer 6 (*)    GFR calc Af Amer 7 (*)    All other components within normal limits  CK - Abnormal; Notable for the following:    Total CK 30 (*)    All other components within normal limits  ACETAMINOPHEN LEVEL - Abnormal; Notable for the following:    Acetaminophen (Tylenol), Serum <10 (*)    All other components within normal limits  BRAIN NATRIURETIC PEPTIDE - Abnormal; Notable for the following:    B Natriuretic Peptide 948.0 (*)    All other components within normal limits  AMMONIA - Abnormal; Notable for the following:    Ammonia 67 (*)    All other components within normal limits  BLOOD GAS,  VENOUS - Abnormal; Notable for the following:    pH, Ven 7.28 (*)    All other components within normal limits  GLUCOSE, CAPILLARY - Abnormal; Notable for the following:    Glucose-Capillary 213 (*)    All other components within normal limits  LIPASE, BLOOD  TROPONIN I  SALICYLATE LEVEL  GLUCOSE, CAPILLARY  URINALYSIS COMPLETEWITH MICROSCOPIC ([facility] ONLY)  CBG MONITORING, ED  CBG MONITORING, ED  CBG MONITORING, ED  CBG MONITORING, ED   ____________________________________________  EKG  Reviewed and interpreted me at 1:20 AM Junctional bradycardia, extreme heart rate 35 QRS 105 QTc 440 No evidence of acute ischemic and around the Third degree heart block ____________________________________________  RADIOLOGY  CLINICAL DATA: 55 year old female with respiratory distress.  EXAM: PORTABLE CHEST 1 VIEW  COMPARISON: Radiograph dated 09/01/2015  FINDINGS: Two views of the chest demonstrate bilateral small to moderate pleural effusions with associated partial compressive atelectasis of the lower lobes. Pneumonia is not excluded. Stable cardiomegaly with central vascular prominence likely congestive changes. No pneumothorax. Left subclavian and axillary vascular stent is again noted.  IMPRESSION: Congestive changes with small to moderate bilateral pleural effusions. Superimposed pneumonia is not excluded. Clinical correlation and follow-up recommended. ____________________________________________   PROCEDURES  Procedure(s) performed: transcutaneous pacing   Transcutaneous pacing set up at a rate of 70, with 30 mA with both electric: And mechanical capture. Patient tolerating well and blood pressure improving currently on pacing and dopamine infusion.  Critical Care performed: Yes, see critical care note(s) CRITICAL CARE Performed by: Delman Kitten   Total critical care time: 70 minutes  Critical care time was exclusive of separately billable procedures and  treating other patients.  Critical care was necessary to treat or prevent imminent or life-threatening deterioration.  Critical care was time spent personally by me on the following activities: development of treatment plan with patient and/or surrogate as well as nursing, discussions with consultants, evaluation of patient's response to treatment, examination of patient, obtaining history from patient or surrogate, ordering and performing treatments and interventions, ordering and review of laboratory studies, ordering and review of radiographic studies, pulse oximetry and re-evaluation of patient's condition.  ____________________________________________   INITIAL IMPRESSION / ASSESSMENT AND PLAN / ED COURSE  Pertinent labs & imaging results that were available during my care of the patient were reviewed by me and considered in my medical decision making (see chart for details).   ----------------------------------------- 1:36 AM  on 10/03/2015 -----------------------------------------  Patient alert and mentating. She is currently transcutaneous pacing at 30 mA with capture at a rate of 70, blood pressure now [id number] systolic and the patient is able to answer questions and look about. She is tolerating transcutaneous pacing well. Treatments for hyperkalemia have all been initiated, patient is clearly in critical condition and shows a junctional rhythm. Continue to await labs, treating presumptively for possible hyperkalemia versus other acute electrolyte abnormality. I have paged Dr. Chancy Milroy of cardiology request for emergent ongoing requirement for pacing.  ----------------------------------------- 2:13 AM on 10/03/2015 -----------------------------------------  Discussed with Dr. Candiss Norse, he is coming with team for emergency dialysis. Hyperkalemia. At this point he advises addition of a bicarbonate drip which I have ordered based on his recommendations.  Dr. Chancy Milroy of cardiology at bedside.  Continuing to assist, if we treat hyperkalemia aggressively we may be able to avoid need for transcutaneous pacer however Dr. Chancy Milroy is here and available and performing consultation. Overall patient awake alert blood pressure improved and mentating well. Her condition is showing signs of good improvement. Good adequate respirations. Admitting to the hospitalist service to ICU.  ----------------------------------------- 2:55 AM on 10/03/2015 -----------------------------------------  Patient continues to be awake alert and is tolerating pacing well. Awaiting ICU admission and arrival of hemodialysis service. ____________________________________________   FINAL CLINICAL IMPRESSION(S) / ED DIAGNOSES  Final diagnoses:  Hyperkalemia  Bradycardia      Delman Kitten, MD 10/03/15 [phone]

## 2015-10-03 NOTE — Progress Notes (Signed)
Inpatient Diabetes Program Recommendations  AACE/ADA: New Consensus Statement on Inpatient Glycemic Control (2015)  Target Ranges:  Prepandial:   less than 140 mg/dL      Peak postprandial:   less than 180 mg/dL (1-2 hours)      Critically ill patients:  140 - 180 mg/dL  Results for LAMYA, GEORGIADIS (MRN [id number]) as of 10/03/2015 10:01  Ref. Range 10/03/2015 01:31 10/03/2015 02:26 10/03/2015 02:54 10/03/2015 03:17 10/03/2015 03:59 10/03/2015 04:50 10/03/2015 05:19 10/03/2015 06:07 10/03/2015 07:46  Glucose-Capillary Latest Ref Range: 65-99 mg/dL 213 (H) 88 63 (L) 115 (H) 62 (L) 68 86 73 96   Review of Glycemic Control  Diabetes history: DM2 Outpatient Diabetes medications: Levemir 6 units QHS, Novolog TID based on correction scale Current orders for Inpatient glycemic control: Novolog 0-9 units Q6H  NOTE: In reviewing chart, noted patient received Novolog 10 units and D50 at 1:21 am for elevated potassium. Glucose down to 63 mg/dl at 2:54 and 62 mg/dl at 3:59. Anticipate hypoglycemia due to receiving Novolog 10 units for hyperkalemia treatment. No recommendations for changes at this time.  Will continue to follow along while inpatient.  Thanks, Barnie Alderman, RN, MSN, CDE Diabetes Coordinator Inpatient Diabetes Program [phone] (Team Pager from [place] to [place]) [phone] ([facility] office) [phone] [facility] office) [phone] [facility] office)

## 2015-10-03 NOTE — Progress Notes (Signed)
Critical potassium with bradycardia in ESRD patient - Medical treatment with shifting measures  - ICU admission - Dialysis staff notified - Dialysis Orders placed for emergent HD tonight

## 2015-10-03 NOTE — Code Documentation (Signed)
CPAP removed, pt on RA @ 96%

## 2015-10-03 NOTE — Progress Notes (Signed)
Updated Dr. Candiss Norse about patient's troponin 0.04 and he ordered to discontinue the sodium bicarb drip.

## 2015-10-03 NOTE — Code Documentation (Signed)
CBG 213 

## 2015-10-03 NOTE — Code Documentation (Signed)
Dr. Khan at bedside

## 2015-10-03 NOTE — Progress Notes (Signed)
Paged Dr. Humphrey Rolls about positive troponin at 0.09- waiting on call back.

## 2015-10-03 NOTE — ED Notes (Signed)
CBG 66 

## 2015-10-04 ENCOUNTER — Inpatient Hospital Stay
Admit: 2015-10-04 | Discharge: 2015-10-04 | Disposition: A | Discharge status: Home / Self Care | Payer: Medicare Other | Attending: Cardiovascular Disease | Admitting: Cardiovascular Disease

## 2015-10-04 LAB — GLUCOSE, CAPILLARY
Glucose-Capillary: 124 mg/dL — ABNORMAL HIGH (ref 65–99)
Glucose-Capillary: 164 mg/dL — ABNORMAL HIGH (ref 65–99)
Glucose-Capillary: 173 mg/dL — ABNORMAL HIGH (ref 65–99)
Glucose-Capillary: 196 mg/dL — ABNORMAL HIGH (ref 65–99)
Glucose-Capillary: 56 mg/dL — ABNORMAL LOW (ref 65–99)
Glucose-Capillary: 68 mg/dL (ref 65–99)
Glucose-Capillary: 76 mg/dL (ref 65–99)
Glucose-Capillary: 96 mg/dL (ref 65–99)

## 2015-10-04 LAB — BASIC METABOLIC PANEL
Anion gap: 6 (ref 5–15)
BUN: 21 mg/dL — ABNORMAL HIGH (ref 6–20)
CO2: 32 mmol/L (ref 22–32)
Calcium: 8 mg/dL — ABNORMAL LOW (ref 8.9–10.3)
Chloride: 94 mmol/L — ABNORMAL LOW (ref 101–111)
Creatinine, Ser: 4.62 mg/dL — ABNORMAL HIGH (ref 0.44–1.00)
GFR calc Af Amer: 11 mL/min — ABNORMAL LOW (ref >60)
GFR calc non Af Amer: 10 mL/min — ABNORMAL LOW (ref >60)
Glucose, Bld: 87 mg/dL (ref 65–99)
Potassium: 4.5 mmol/L (ref 3.5–5.1)
Sodium: 132 mmol/L — ABNORMAL LOW (ref 135–145)

## 2015-10-04 MED ORDER — HYDROCODONE-ACETAMINOPHEN 5-325 MG PO TABS
1.0000 | ORAL_TABLET | Freq: Four times a day (QID) | ORAL | Status: DC | PRN
  Administered 2015-10-04 – 2015-10-05 (×2): 1 via ORAL
  Filled 2015-10-04 (×2): qty 1

## 2015-10-04 MED ORDER — PRO-STAT SUGAR FREE PO LIQD
30.0000 mL | Freq: Two times a day (BID) | ORAL | Status: DC
  Administered 2015-10-05: 30 mL via ORAL

## 2015-10-04 MED ORDER — IPRATROPIUM-ALBUTEROL 0.5-2.5 (3) MG/3ML IN SOLN
3.0000 mL | Freq: Four times a day (QID) | RESPIRATORY_TRACT | Status: DC | PRN
  Administered 2015-10-04: 3 mL via RESPIRATORY_TRACT
  Filled 2015-10-04: qty 3

## 2015-10-04 NOTE — Progress Notes (Signed)
FSBS 68.  4oz OJ administered per hypoglycemic side bar.  Will continue to monitor.

## 2015-10-04 NOTE — Progress Notes (Signed)
MD notified. Pt complains of feeling short of breath. Oxygen saturation 95% on room air lungs diminished to asucultation. MD order for duoneb every 6 hours as needed. I will continue to assess.

## 2015-10-04 NOTE — Progress Notes (Signed)
Initial Nutrition Assessment    INTERVENTION:   Meals and Snacks: Cater to patient preferences; pt would likely benefit from smaller, more frequent meals Medical Food Supplement Therapy: pt takes Pro-stat at outpatient dialysis; pt agreeable to trying although she does not like the taste. Wills end BID. Pt also agreeable to nutritional shake for added kcals/protein as po intake is poor at present; pt does not like Nepro, says it gives her diarrhea. Reports she likes the [facility] and can tolerate these, wills end TID  NUTRITION DIAGNOSIS:   Inadequate oral intake related to chronic illness, nausea as evidenced by estimated needs, meal completion < 25%.  GOAL:   Patient will meet greater than or equal to 90% of their needs  MONITOR:    (Energy Intake, Anthropometrics, Digestive System, Electrolyte/Renal Profile, gLucose Profile)  REASON FOR ASSESSMENT:   Malnutrition Screening Tool    ASSESSMENT:    Pt admitted with acute encephalopathy/unresposnive, pulmonary edema and hyperkalemia with ESRD due to missed HD requiring urgent HD, N/V; 2.5 L fluid removed via HD. Pt alert on visit today  Past Medical History  Diagnosis Date  . Asthma   . Collagen vascular disease ([place])   . Diabetes mellitus without complication ([place])   . Hypertension   . Coronary artery disease     a. cath 2013: stenting to RCA (report not available); b. cath 2014: LM nl, pLAD 40%, mLAD nl, ost LCx 40%, mid LCx nl, pRCA 30% @ site of prior stent, mRCA 50%  . Diabetic neuropathy ([place])   . ESRD (end stage renal disease) on dialysis ([place])     M-W-F  . GERD (gastroesophageal reflux disease)   . Hx of pancreatitis 2015  . Myocardial infarction ([place])   . Pneumonia   . Mitral regurgitation     a. echo 10/2013: EF 62%, noWMA, mildly dilated LA, mild to mod MR/TR, GR1DD  . COPD (chronic obstructive pulmonary disease) ([place])   . chronic diastolic CHF 123XX123  . dialysis 2006  . Depression   . Anxiety   .  Arthritis   . Headache   . Anemia      Diet Order:  Diet renal with fluid restriction Fluid restriction:: 1200 mL Fluid; Room service appropriate?: Yes; Fluid consistency:: Thin   Energy Intake: no recorded po intake; pt ate bites at lunch today. Reports fair appetite at present  Food and Nutrition Related History: pt reports appetite had actually been improving at home; pt had been eating better, focusing on protein intake  Electrolyte and Renal Profile:  Recent Labs Lab 10/03/15 0445 10/03/15 1147 10/04/15 0707  BUN 36* 18 21*  CREATININE 6.50* 3.70* 4.62*  NA 134* 137 132*  K 5.5* 4.0 4.5   Glucose Profile:   Recent Labs  10/04/15 0743 10/04/15 1158 10/04/15 1604  GLUCAP 76 124* 196*   Meds: ss novolog, renvela, D5 at 40 on hold at present  Height:   Ht Readings from Last 1 Encounters:  10/03/15 5\' 1"  (1.549 m)    Weight: wt relatively stable as per weight encounters  Wt Readings from Last 1 Encounters:  10/04/15 183 lb 6.4 oz (83.19 kg)    Wt Readings from Last 10 Encounters:  10/04/15 183 lb 6.4 oz (83.19 kg)  09/03/15 174 lb 2.6 oz (79 kg)  08/23/15 172 lb (78.019 kg)  07/25/15 171 lb 11.8 oz (77.9 kg)  07/20/15 174 lb (78.926 kg)  06/18/15 175 lb (79.379 kg)  06/16/15 175 lb (79.379 kg)  06/15/15  175 lb (79.379 kg)  06/14/15 175 lb (79.379 kg)  06/06/15 179 lb 7.3 oz (81.4 kg)    BMI:  Body mass index is 34.67 kg/(m^2).  Estimated Nutritional Needs:   Kcal:  [id number] kcals (BEE 1004, 1.3 AF, 1.1-1.3 IF)   Protein:  58-72 g (1.2-1.5 g/kg)   Fluid:  1000 mL plus [place] MS, RD, LDN [phone] Pager  [phone] Weekend/On-Call Pager

## 2015-10-04 NOTE — Progress Notes (Signed)
Subjective:  2.5 L removed with HD Feels better this AM States she has been drinking a juice every morning (which probably contributed to the high K)  Objective:  Vital signs in last 24 hours:  Temp:  [98.2 F (36.8 C)-99 F (37.2 C)] 98.2 F (36.8 C) (01/12 0749) Pulse Rate:  [61-82] 64 (01/12 0749) Resp:  [13-23] 17 (01/12 0749) BP: (107-138)/(47-59) 113/53 mmHg (01/12 0600) SpO2:  [90 %-96 %] 91 % (01/12 0749) Weight:  [85.7 kg (188 lb 15 oz)] 85.7 kg (188 lb 15 oz) (01/12 0500)  Weight change: 2.8 kg (6 lb 2.8 oz) Filed Weights   10/03/15 0500 10/04/15 0500  Weight: 82.9 kg (182 lb 12.2 oz) 85.7 kg (188 lb 15 oz)    Intake/Output:    Intake/Output Summary (Last 24 hours) at 10/04/15 0913 Last data filed at 10/04/15 0901  Gross per 24 hour  Intake  947.2 ml  Output      0 ml  Net  947.2 ml     Physical Exam: General: NAD  HEENT Moist oral mucus membranes  Neck supple  Pulm/lungs Normal effort, clear to auscultation  CVS/Heart Regular, no rub  Abdomen:  Soft, non tender  Extremities: + peripheral edema  Neurologic: lethargic  Skin: No acute rashes  Access: Left arm AVF       Basic Metabolic Panel:   Recent Labs Lab 10/03/15 0116 10/03/15 0445 10/03/15 1147 10/04/15 0707  NA 133* 134* 137 132*  K 6.6* 5.5* 4.0 4.5  CL 96* 96* 95* 94*  CO2 25 29 33* 32  GLUCOSE 142* 55* 70 87  BUN 37* 36* 18 21*  CREATININE 6.50* 6.50* 3.70* 4.62*  CALCIUM 8.3* 8.6* 8.8* 8.0*     CBC:  Recent Labs Lab 10/03/15 0116 10/03/15 0445  WBC 5.7 8.8  NEUTROABS 3.8  --   HGB 11.1* 10.6*  HCT 34.4* 32.3*  MCV 83.9 85.1  PLT 202 213      Microbiology:  Recent Results (from the past 720 hour(s))  MRSA PCR Screening     Status: None   Collection Time: 10/03/15  4:00 AM  Result Value Ref Range Status   MRSA by PCR NEGATIVE NEGATIVE Final    Comment:        The GeneXpert MRSA Assay (FDA approved for NASAL specimens only), is one component of  a comprehensive MRSA colonization surveillance program. It is not intended to diagnose MRSA infection nor to guide or monitor treatment for MRSA infections.     Coagulation Studies: No results for input(s): LABPROT, INR in the last 72 hours.  Urinalysis: No results for input(s): COLORURINE, LABSPEC, PHURINE, GLUCOSEU, HGBUR, BILIRUBINUR, KETONESUR, PROTEINUR, UROBILINOGEN, NITRITE, LEUKOCYTESUR in the last 72 hours.  Invalid input(s): APPERANCEUR    Imaging: Dg Chest Port 1 View  10/03/2015  CLINICAL DATA:  55 year old female with respiratory distress. EXAM: PORTABLE CHEST 1 VIEW COMPARISON:  Radiograph dated 09/01/2015 FINDINGS: Two views of the chest demonstrate bilateral small to moderate pleural effusions with associated partial compressive atelectasis of the lower lobes. Pneumonia is not excluded. Stable cardiomegaly with central vascular prominence likely congestive changes. No pneumothorax. Left subclavian and axillary vascular stent is again noted. IMPRESSION: Congestive changes with small to moderate bilateral pleural effusions. Superimposed pneumonia is not excluded. Clinical correlation and follow-up recommended. Electronically Signed   By: Anner Crete M.D.   On: 10/03/2015 01:49     Medications:   . dextrose 40 mL/hr at 10/04/15 0700  . DOPamine 5  mcg/kg/min (10/03/15 1225)   . aspirin EC  81 mg Oral Daily  . atorvastatin  20 mg Oral QPM  . citalopram  20 mg Oral Daily  . clopidogrel  75 mg Oral Daily  . heparin  5,000 Units Subcutaneous 3 times per day  . insulin aspart  0-9 Units Subcutaneous Q6H  . ranolazine  1,000 mg Oral BID  . sevelamer carbonate  1,600 mg Oral TID WC  . sodium chloride  3 mL Intravenous Q12H   promethazine  Assessment/ Plan:  55 y.o. 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 admitted for bradycardia and hyperkalemia    [facility] MWF [place]  1. End Stage Renal Disease: with Hyperkalemia and pulm edema -  K corrected - next routine treatment tomorrow   2. Anemia of chronic kidney disease:  - hemoglobin currently 10.6 - low dose EPO with HD  3. Secondary Hyperparathyroidism:  Continue Renvela 1600 mg by mouth 3 times a day once able to eat normal diet   LOS: 1 Haileigh Pitz 1/12/20179:13 AM

## 2015-10-04 NOTE — Progress Notes (Signed)
Md notified. Patient complains of pain. No pain medication ordered. Orders received. Will continue to assess.

## 2015-10-04 NOTE — Care Management (Signed)
Patient transferred out of icu to 2A.

## 2015-10-04 NOTE — Care Management Note (Signed)
Patient is active at [facility] on a MWF schedule.  Admission records have been sent to the center and additional records will be sent at discharge.  [name] Liaison  [phone]

## 2015-10-04 NOTE — Progress Notes (Signed)
SUBJECTIVE: Feeling much better  Filed Vitals:   10/04/15 0400 10/04/15 0500 10/04/15 0600 10/04/15 0749  BP: 118/55  113/53   Pulse: 61 61 62 64  Temp:    98.2 F (36.8 C)  TempSrc:      Resp: 14 14 18 17   Height:      Weight:  188 lb 15 oz (85.7 kg)    SpO2: 93% 92% 94% 91%    Intake/Output Summary (Last 24 hours) at 10/04/15 0852 Last data filed at 10/03/15 1900  Gross per 24 hour  Intake  944.2 ml  Output  -2500 ml  Net 3444.2 ml    LABS: Basic Metabolic Panel:  Recent Labs  10/03/15 1147 10/04/15 0707  NA 137 132*  K 4.0 4.5  CL 95* 94*  CO2 33* 32  GLUCOSE 70 87  BUN 18 21*  CREATININE 3.70* 4.62*  CALCIUM 8.8* 8.0*   Liver Function Tests:  Recent Labs  10/03/15 0116  AST 43*  ALT 17  ALKPHOS 507*  BILITOT 1.1  PROT 6.9  ALBUMIN 3.0*    Recent Labs  10/03/15 0116  LIPASE 18   CBC:  Recent Labs  10/03/15 0116 10/03/15 0445  WBC 5.7 8.8  NEUTROABS 3.8  --   HGB 11.1* 10.6*  HCT 34.4* 32.3*  MCV 83.9 85.1  PLT 202 213   Cardiac Enzymes:  Recent Labs  10/03/15 0116 10/03/15 0444 10/03/15 0600 10/03/15 1655  CKTOTAL 30*  --   --   --   TROPONINI 0.03 0.03 0.04* 0.09*   BNP: Invalid input(s): POCBNP D-Dimer: No results for input(s): DDIMER in the last 72 hours. Hemoglobin A1C: No results for input(s): HGBA1C in the last 72 hours. Fasting Lipid Panel: No results for input(s): CHOL, HDL, LDLCALC, TRIG, CHOLHDL, LDLDIRECT in the last 72 hours. Thyroid Function Tests: No results for input(s): TSH, T4TOTAL, T3FREE, THYROIDAB in the last 72 hours.  Invalid input(s): FREET3 Anemia Panel: No results for input(s): VITAMINB12, FOLATE, FERRITIN, TIBC, IRON, RETICCTPCT in the last 72 hours.  The top PHYSICAL EXAM General: Well developed, well nourished, in no acute distress HEENT:  Normocephalic and atramatic Neck:  No JVD.  Lungs: Clear bilaterally to auscultation and percussion. Heart: HRRR . Normal S1 and S2 without gallops  or murmurs.  Abdomen: Bowel sounds are positive, abdomen soft and non-tender  Msk:  Back normal, normal gait. Normal strength and tone for age. Extremities: No clubbing, cyanosis or edema.   Neuro: Alert and oriented X 3. Psych:  Good affect, responds appropriately  TELEMETRY: Sinus rhythm at 65 bpm   ASSESSMENT AND PLAN: Hyperkalemia lead to junctional rhythm with a heart rate around 33 with treatment of medication for hyperkalemia and external pacing for 3 hours lead to some improvement but significant improvement occurred after dialysis. Patient is feeling much better and emphasize to the patient that she has to get dialysis on a regular basis. May go to telemetry and discharged with follow-up in the office.  Principal Problem:   Unresponsiveness Active Problems:   End-stage renal disease on hemodialysis (HCC)   DM (diabetes mellitus) ([place])   HTN (hypertension)   Coronary artery disease involving native coronary artery of native heart without angina pectoris   Bradycardia   Hyperkalemia    KHAN,SHAUKAT A, MD, [facility] 10/04/2015 8:52 AM

## 2015-10-04 NOTE — Progress Notes (Signed)
Echocardiogram 2D Echocardiogram has been performed.  Joelene Millin 10/04/2015, 9:41 AM

## 2015-10-04 NOTE — Progress Notes (Addendum)
[place] [facility] [place] NAME: [name]    MR#:  [id number]  DATE OF BIRTH:  03/05/1961  SUBJECTIVE:  Doing well. Off transcutaneous pacer. K corrected. Feels ok  REVIEW OF SYSTEMS:   Review of Systems  Constitutional: Positive for malaise/fatigue. Negative for fever, chills and weight loss.  HENT: Negative for ear discharge, ear pain and nosebleeds.   Eyes: Negative for blurred vision, pain and discharge.  Respiratory: Negative for sputum production, shortness of breath, wheezing and stridor.   Cardiovascular: Negative for chest pain, palpitations, orthopnea and PND.  Gastrointestinal: Negative for nausea, vomiting, abdominal pain and diarrhea.  Genitourinary: Negative for urgency and frequency.  Musculoskeletal: Positive for back pain. Negative for joint pain.  Neurological: Positive for weakness. Negative for sensory change, speech change and focal weakness.  Psychiatric/Behavioral: Negative for depression and hallucinations. The patient is not nervous/anxious.   All other systems reviewed and are negative.  DRUG ALLERGIES:   Allergies  Allergen Reactions  . Compazine [Prochlorperazine Edisylate] Anaphylaxis and Nausea And Vomiting  . Ace Inhibitors Swelling  . Ativan [Lorazepam] Other (See Comments)    Reaction:  Hallucinations and headaches  . Codeine Nausea And Vomiting  . Gabapentin Other (See Comments)    Reaction:  Unknown   . Losartan Other (See Comments)    Reaction:  Unknown   . Ondansetron Other (See Comments)    Reaction:  Unknown   . Prochlorperazine Other (See Comments)    Reaction:  Unknown   . Scopolamine Other (See Comments)    Reaction:  Unknown   . Zofran [Ondansetron Hcl] Other (See Comments)    Reaction:  hallucinations   . Oxycodone Anxiety  . Tape Rash    VITALS:  Blood pressure 114/55, pulse 58, temperature 98 F (36.7 C), temperature source Oral, resp. rate 18, height 5\' 1"  (1.549 m), weight  83.19 kg (183 lb 6.4 oz), SpO2 100 %.  PHYSICAL EXAMINATION:  GENERAL:  55 y.o.-year-old patient lying in the bed with no acute distress. Critically ill EYES: Pupils equal, round, reactive to light and accommodation. No scleral icterus. Extraocular muscles intact.  HEENT: Head atraumatic, normocephalic. Oropharynx and nasopharynx clear.  NECK:  Supple, no jugular venous distention. No thyroid enlargement, no tenderness.  LUNGS: Normal breath sounds bilaterally, no wheezing, rales,rhonchi or crepitation. No use of accessory muscles of respiration.  CARDIOVASCULAR: S1, S2 normal. No murmurs, rubs, or gallops.  ABDOMEN: Soft, nontender, nondistended. Bowel sounds present. No organomegaly or mass.  EXTREMITIES: No pedal edema, cyanosis, or clubbing.  NEUROLOGIC:grossly nonfocal PSYCHIATRIC alert and oriented x3 SKIN: No obvious rash, lesion, or ulcer.    LABORATORY PANEL:   CBC  Recent Labs Lab 10/03/15 0116 10/03/15 0445  WBC 5.7 8.8  HGB 11.1* 10.6*  HCT 34.4* 32.3*  PLT 202 213   ------------------------------------------------------------------------------------------------------------------  Chemistries   Recent Labs Lab 10/03/15 0116  10/03/15 1147 10/04/15 0707  NA 133*  < > 137 132*  K 6.6*  < > 4.0 4.5  CL 96*  < > 95* 94*  CO2 25  < > 33* 32  GLUCOSE 142*  < > 70 87  BUN 37*  < > 18 21*  CREATININE 6.50*  < > 3.70* 4.62*  CALCIUM 8.3*  < > 8.8* 8.0*  AST 43*  --   --   --   ALT 17  --   --   --   ALKPHOS 507*  --   --   --  BILITOT 1.1  --   --   --   < > = values in this interval not displayed. ------------------------------------------------------------------------------------------------------------------  Cardiac Enzymes  Recent Labs Lab 10/03/15 0600 10/03/15 1655  TROPONINI 0.04* 0.09*   ------------------------------------------------------------------------------------------------------------------  RADIOLOGY:  Dg Chest Port 1  View  10/03/2015  CLINICAL DATA:  55 year old female with respiratory distress. EXAM: PORTABLE CHEST 1 VIEW COMPARISON:  Radiograph dated 09/01/2015 FINDINGS: Two views of the chest demonstrate bilateral small to moderate pleural effusions with associated partial compressive atelectasis of the lower lobes. Pneumonia is not excluded. Stable cardiomegaly with central vascular prominence likely congestive changes. No pneumothorax. Left subclavian and axillary vascular stent is again noted. IMPRESSION: Congestive changes with small to moderate bilateral pleural effusions. Superimposed pneumonia is not excluded. Clinical correlation and follow-up recommended. Electronically Signed   By: Anner Crete M.D.   On: 10/03/2015 01:49     ASSESSMENT AND PLAN:  [name] is a 55 y.o. female who presents unresponsive. Patient was found unresponsive and then transported by EMS to the ED for evaluation. She is an end-stage renal disease patient on hemodialysis who missed her last dialysis session. Here she was found to be bradycardic with a junctional rhythm in the 30s. Her potassium was found to be 6.6  1. acute encephalopathy/Unresponsiveness  -resolved -off dopamine -Blood pressure much improved. Heart rate in the 60s. - off transcutaneous pacing and pressor  -bp stable -Seen by cardiology.  2.End-stage renal disease on hemodialysis ([place]) with severe hypokalemia due to missing dialysis. -Appreciated urgent nephrology consult for dialysis -Patient currently getting dialyzed. Her potassium is down from 6.6-5.5 -patient was given insulin and albuterol and bicarbonate  3. Bradycardia - junctional rhythm requiring transcutaneous pacing.  -off transvenous pacer.   4. Severe hypotension. Blood pressure was in the 123456 to 0000000 systolic -off pressores  5. DM (diabetes mellitus) ([place]) - correctional insulin with glucose checks every 6 hours while nothing by mouth and hypoglycemic protocol. -Sugars on the  lower side. -Renal diet  6. HTN (hypertension) - hypotensive on pressors at this time. Hold all antihypertensive medications  7. Coronary artery disease involving native coronary artery of native heart without angina pectoris - continue home meds for this, though somebody been held at this time due to her hypotension.   8. Intractable nausea -Patient prefers when necessary Phenergan  All the records are reviewed and case discussed with Care Management/Social Workerr. Management plans discussed with the patient & in agreement.  CODE STATUS: Full  TOTAL CRITICAL TIME TAKING CARE OF THIS PATIENT: 35 minutes.   POSSIBLE D/C IN few DAYS, DEPENDING ON CLINICAL CONDITION.   [name] M.D on 10/04/2015 at 6:50 PM  Between 7am to 6pm - Pager - [phone]  After 6pm go to [email or web address] - password [email or web address] [facility]  Office  [phone]  CC: Primary care physician; Ellamae Sia, MD

## 2015-10-04 NOTE — Progress Notes (Signed)
Recheck FSBS.  Reading at 96.  Will continue to monitor.

## 2015-10-05 LAB — RENAL FUNCTION PANEL
Albumin: 2.7 g/dL — ABNORMAL LOW (ref 3.5–5.0)
Anion gap: 11 (ref 5–15)
BUN: 27 mg/dL — ABNORMAL HIGH (ref 6–20)
CO2: 29 mmol/L (ref 22–32)
Calcium: 8 mg/dL — ABNORMAL LOW (ref 8.9–10.3)
Chloride: 88 mmol/L — ABNORMAL LOW (ref 101–111)
Creatinine, Ser: 5.21 mg/dL — ABNORMAL HIGH (ref 0.44–1.00)
GFR calc Af Amer: 10 mL/min — ABNORMAL LOW (ref >60)
GFR calc non Af Amer: 8 mL/min — ABNORMAL LOW (ref >60)
Glucose, Bld: 106 mg/dL — ABNORMAL HIGH (ref 65–99)
Phosphorus: 5.5 mg/dL — ABNORMAL HIGH (ref 2.5–4.6)
Potassium: 5 mmol/L (ref 3.5–5.1)
Sodium: 128 mmol/L — ABNORMAL LOW (ref 135–145)

## 2015-10-05 LAB — GLUCOSE, CAPILLARY
Glucose-Capillary: 108 mg/dL — ABNORMAL HIGH (ref 65–99)
Glucose-Capillary: 108 mg/dL — ABNORMAL HIGH (ref 65–99)
Glucose-Capillary: 116 mg/dL — ABNORMAL HIGH (ref 65–99)
Glucose-Capillary: 140 mg/dL — ABNORMAL HIGH (ref 65–99)
Glucose-Capillary: 40 mg/dL — CL (ref 65–99)
Glucose-Capillary: 68 mg/dL (ref 65–99)
Glucose-Capillary: 76 mg/dL (ref 65–99)
Glucose-Capillary: 89 mg/dL (ref 65–99)

## 2015-10-05 LAB — CBC
HCT: 31.8 % — ABNORMAL LOW (ref 35.0–47.0)
Hemoglobin: 10.5 g/dL — ABNORMAL LOW (ref 12.0–16.0)
MCH: 27.4 pg (ref 26.0–34.0)
MCHC: 32.9 g/dL (ref 32.0–36.0)
MCV: 83.2 fL (ref 80.0–100.0)
Platelets: 201 10*3/uL (ref 150–440)
RBC: 3.82 MIL/uL (ref 3.80–5.20)
RDW: 21.7 % — ABNORMAL HIGH (ref 11.5–14.5)
WBC: 4 10*3/uL (ref 3.6–11.0)

## 2015-10-05 MED ORDER — CARVEDILOL 6.25 MG PO TABS: 6.2500 mg | ORAL_TABLET | Freq: Two times a day (BID) | ORAL | Status: DC

## 2015-10-05 MED ORDER — DEXTROSE 50 % IV SOLN
12.5000 g | Freq: Once | INTRAVENOUS | Status: AC
  Administered 2015-10-05: 25 mL via INTRAVENOUS

## 2015-10-05 MED ORDER — LORATADINE 10 MG PO TABS
10.0000 mg | ORAL_TABLET | Freq: Every day | ORAL | Status: DC
  Administered 2015-10-05: 10 mg via ORAL
  Filled 2015-10-05: qty 1

## 2015-10-05 MED ORDER — PREGABALIN 25 MG PO CAPS: 25.0000 mg | ORAL_CAPSULE | Freq: Two times a day (BID) | ORAL | Status: DC | PRN

## 2015-10-05 MED ORDER — GLUCOSE 4 G PO CHEW
3.0000 | CHEWABLE_TABLET | ORAL | Status: DC | PRN
  Filled 2015-10-05: qty 1

## 2015-10-05 MED ORDER — PRO-STAT SUGAR FREE PO LIQD: 30.0000 mL | Freq: Two times a day (BID) | ORAL | Status: DC

## 2015-10-05 MED ORDER — LOPERAMIDE HCL 2 MG PO CAPS
4.0000 mg | ORAL_CAPSULE | Freq: Once | ORAL | Status: AC
  Administered 2015-10-05: 4 mg via ORAL
  Filled 2015-10-05: qty 2

## 2015-10-05 MED ORDER — EPOETIN ALFA 10000 UNIT/ML IJ SOLN
4000.0000 [IU] | INTRAMUSCULAR | Status: DC
  Administered 2015-10-05: 4000 [IU] via INTRAVENOUS

## 2015-10-05 MED ORDER — CHOLECALCIFEROL 10 MCG (400 UNIT) PO TABS
400.0000 [IU] | ORAL_TABLET | Freq: Every day | ORAL | Status: DC
  Administered 2015-10-05: 400 [IU] via ORAL
  Filled 2015-10-05: qty 1

## 2015-10-05 MED ORDER — VITAMIN B-12 1000 MCG PO TABS
1000.0000 ug | ORAL_TABLET | Freq: Every day | ORAL | Status: DC
  Administered 2015-10-05: 1000 ug via ORAL
  Filled 2015-10-05: qty 1

## 2015-10-05 MED ORDER — RENA-VITE PO TABS: 1.0000 | ORAL_TABLET | Freq: Every day | ORAL | Status: DC

## 2015-10-05 MED ORDER — NITROGLYCERIN 0.4 MG SL SUBL: 0.4000 mg | SUBLINGUAL_TABLET | SUBLINGUAL | Status: DC | PRN

## 2015-10-05 MED ORDER — FAMOTIDINE 20 MG PO TABS
20.0000 mg | ORAL_TABLET | Freq: Every day | ORAL | Status: DC
  Administered 2015-10-05: 20 mg via ORAL
  Filled 2015-10-05: qty 1

## 2015-10-05 MED ORDER — CARVEDILOL 3.125 MG PO TABS: 25.0000 mg | ORAL_TABLET | Freq: Two times a day (BID) | ORAL | Status: DC

## 2015-10-05 MED ORDER — PANCRELIPASE (LIP-PROT-AMYL) 12000-38000 UNITS PO CPEP
12000.0000 [IU] | ORAL_CAPSULE | Freq: Three times a day (TID) | ORAL | Status: DC
  Administered 2015-10-05 (×2): 12000 [IU] via ORAL
  Filled 2015-10-05 (×2): qty 1

## 2015-10-05 MED ORDER — DEXTROSE 50 % IV SOLN
INTRAVENOUS | Status: AC
  Administered 2015-10-05: 25 mL via INTRAVENOUS
  Filled 2015-10-05: qty 50

## 2015-10-05 NOTE — Progress Notes (Signed)
[name] MRN: [id number] DOB: 11-05-1960   Cancelled Treatment:    Reason Eval/Treat Not Completed: Patient at procedure or test/unavailable. Patient is currently off the floor for dialysis. PT will continue to follow and re-attempt at a later time/date as appropriate.   Kerman Passey, PT, DPT    10/05/2015, 11:02 AM

## 2015-10-05 NOTE — Progress Notes (Signed)
SUBJECTIVE: Patient is feeling nauseous today and some short of breath   Filed Vitals:   10/04/15 0749 10/04/15 1341 10/04/15 1931 10/05/15 0408  BP:  114/55 133/65 118/61  Pulse: 64 58 58 59  Temp: 98.2 F (36.8 C) 98 F (36.7 C) 97.7 F (36.5 C) 98 F (36.7 C)  TempSrc:   Oral Oral  Resp: 17 18 16 16   Height:      Weight:  183 lb 6.4 oz (83.19 kg)  189 lb 4.8 oz (85.866 kg)  SpO2: 91% 100% 97% 98%    Intake/Output Summary (Last 24 hours) at 10/05/15 0907 Last data filed at 10/05/15 0413  Gross per 24 hour  Intake   1040 ml  Output      0 ml  Net   1040 ml    LABS: Basic Metabolic Panel:  Recent Labs  10/03/15 1147 10/04/15 0707  NA 137 132*  K 4.0 4.5  CL 95* 94*  CO2 33* 32  GLUCOSE 70 87  BUN 18 21*  CREATININE 3.70* 4.62*  CALCIUM 8.8* 8.0*   Liver Function Tests:  Recent Labs  10/03/15 0116  AST 43*  ALT 17  ALKPHOS 507*  BILITOT 1.1  PROT 6.9  ALBUMIN 3.0*    Recent Labs  10/03/15 0116  LIPASE 18   CBC:  Recent Labs  10/03/15 0116 10/03/15 0445  WBC 5.7 8.8  NEUTROABS 3.8  --   HGB 11.1* 10.6*  HCT 34.4* 32.3*  MCV 83.9 85.1  PLT 202 213   Cardiac Enzymes:  Recent Labs  10/03/15 0116 10/03/15 0444 10/03/15 0600 10/03/15 1655  CKTOTAL 30*  --   --   --   TROPONINI 0.03 0.03 0.04* 0.09*   BNP: Invalid input(s): POCBNP D-Dimer: No results for input(s): DDIMER in the last 72 hours. Hemoglobin A1C: No results for input(s): HGBA1C in the last 72 hours. Fasting Lipid Panel: No results for input(s): CHOL, HDL, LDLCALC, TRIG, CHOLHDL, LDLDIRECT in the last 72 hours. Thyroid Function Tests: No results for input(s): TSH, T4TOTAL, T3FREE, THYROIDAB in the last 72 hours.  Invalid input(s): FREET3 Anemia Panel: No results for input(s): VITAMINB12, FOLATE, FERRITIN, TIBC, IRON, RETICCTPCT in the last 72 hours.   PHYSICAL EXAM General: Well developed, well nourished, in no acute distress HEENT:  Normocephalic and  atramatic Neck:  No JVD.  Lungs: Clear bilaterally to auscultation and percussion. Heart: HRRR . Normal S1 and S2 without gallops or murmurs.  Abdomen: Bowel sounds are positive, abdomen soft and non-tender  Msk:  Back normal, normal gait. Normal strength and tone for age. Extremities: No clubbing, cyanosis or edema.   Neuro: Alert and oriented X 3. Psych:  Good affect, responds appropriately  TELEMETRY: Sinus rhythm at 65 bpm  ASSESSMENT AND PLAN: Status post junctional rhythm with heart rate of 38 requiring external pacing which was due to hyperkalemia. Patient is doing very well after dialysis we will still hold off giving Corag but will start entresto as soon as her blood pressure gets better. Patient's has an ejection fraction about 40%. On echocardiogram.  Principal Problem:   Unresponsiveness Active Problems:   End-stage renal disease on hemodialysis (HCC)   DM (diabetes mellitus) ([place])   HTN (hypertension)   Coronary artery disease involving native coronary artery of native heart without angina pectoris   Bradycardia   Hyperkalemia    Jaydence Vanyo A, MD, [facility] 10/05/2015 9:07 AM

## 2015-10-05 NOTE — Progress Notes (Signed)
Subjective:  Patient seen during dialysis Tolerating well  Reports feeling nauseous this AM   HEMODIALYSIS FLOWSHEET:  Blood Flow Rate (mL/min): 350 mL/min Arterial Pressure (mmHg): -150 mmHg Venous Pressure (mmHg): 150 mmHg Transmembrane Pressure (mmHg): 60 mmHg Ultrafiltration Rate (mL/min): 720 mL/min Dialysate Flow Rate (mL/min): 800 ml/min Conductivity: Machine : 14 Conductivity: Machine : 14 Dialysis Fluid Bolus: Normal Saline Bolus Amount (mL): 250 mL Intra-Hemodialysis Comments: 583ML (RESTLESS, TALKING IN HER SLEEP)    Objective:  Vital signs in last 24 hours:  Temp:  [97.6 F (36.4 C)-98 F (36.7 C)] 97.6 F (36.4 C) (01/13 1045) Pulse Rate:  [58-59] 59 (01/13 0408) Resp:  [16-18] 16 (01/13 0408) BP: (114-133)/(55-65) 118/61 mmHg (01/13 0408) SpO2:  [97 %-100 %] 97 % (01/13 1045) Weight:  [83.19 kg (183 lb 6.4 oz)-86.7 kg (191 lb 2.2 oz)] 86.7 kg (191 lb 2.2 oz) (01/13 1045)  Weight change: -2.51 kg (-5 lb 8.6 oz) Filed Weights   10/04/15 1341 10/05/15 0408 10/05/15 1045  Weight: 83.19 kg (183 lb 6.4 oz) 85.866 kg (189 lb 4.8 oz) 86.7 kg (191 lb 2.2 oz)    Intake/Output:    Intake/Output Summary (Last 24 hours) at 10/05/15 1154 Last data filed at 10/05/15 0830  Gross per 24 hour  Intake   1040 ml  Output      0 ml  Net   1040 ml     Physical Exam: General: NAD  HEENT Moist oral mucus membranes  Neck supple  Pulm/lungs Normal effort, clear to auscultation  CVS/Heart Regular, no rub  Abdomen:  Soft, non tender  Extremities: + peripheral edema  Neurologic: lethargic  Skin: No acute rashes  Access: Left arm AVF       Basic Metabolic Panel:   Recent Labs Lab 10/03/15 0116 10/03/15 0445 10/03/15 1147 10/04/15 0707 10/05/15 1051  NA 133* 134* 137 132* 128*  K 6.6* 5.5* 4.0 4.5 5.0  CL 96* 96* 95* 94* 88*  CO2 25 29 33* 32 29  GLUCOSE 142* 55* 70 87 106*  BUN 37* 36* 18 21* 27*  CREATININE 6.50* 6.50* 3.70* 4.62* 5.21*  CALCIUM  8.3* 8.6* 8.8* 8.0* 8.0*  PHOS  --   --   --   --  5.5*     CBC:  Recent Labs Lab 10/03/15 0116 10/03/15 0445 10/05/15 1051  WBC 5.7 8.8 4.0  NEUTROABS 3.8  --   --   HGB 11.1* 10.6* 10.5*  HCT 34.4* 32.3* 31.8*  MCV 83.9 85.1 83.2  PLT 202 213 201      Microbiology:  Recent Results (from the past 720 hour(s))  MRSA PCR Screening     Status: None   Collection Time: 10/03/15  4:00 AM  Result Value Ref Range Status   MRSA by PCR NEGATIVE NEGATIVE Final    Comment:        The GeneXpert MRSA Assay (FDA approved for NASAL specimens only), is one component of a comprehensive MRSA colonization surveillance program. It is not intended to diagnose MRSA infection nor to guide or monitor treatment for MRSA infections.     Coagulation Studies: No results for input(s): LABPROT, INR in the last 72 hours.  Urinalysis: No results for input(s): COLORURINE, LABSPEC, PHURINE, GLUCOSEU, HGBUR, BILIRUBINUR, KETONESUR, PROTEINUR, UROBILINOGEN, NITRITE, LEUKOCYTESUR in the last 72 hours.  Invalid input(s): APPERANCEUR    Imaging: No results found.   Medications:     . aspirin EC  81 mg Oral Daily  . atorvastatin  20 mg Oral QPM  . cholecalciferol  400 Units Oral Daily  . citalopram  20 mg Oral Daily  . clopidogrel  75 mg Oral Daily  . epoetin (EPOGEN/PROCRIT) injection  4,000 Units Intravenous Q M,W,F-HD  . famotidine  20 mg Oral Daily  . feeding supplement (PRO-STAT SUGAR FREE 64)  30 mL Oral BID BM  . heparin  5,000 Units Subcutaneous 3 times per day  . insulin aspart  0-9 Units Subcutaneous Q6H  . lipase/protease/amylase  12,000 Units Oral TID WC  . loratadine  10 mg Oral Daily  . multivitamin  1 tablet Oral QHS  . sevelamer carbonate  1,600 mg Oral TID WC  . sodium chloride  3 mL Intravenous Q12H  . vitamin B-12  1,000 mcg Oral Daily   glucose, HYDROcodone-acetaminophen, ipratropium-albuterol, nitroGLYCERIN, pregabalin, promethazine  Assessment/ Plan:  55  y.o. 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 admitted for bradycardia and hyperkalemia    [facility] MWF [place]  1. End Stage Renal Disease: with Hyperkalemia and pulm edema -  K corrected - Patient seen during dialysis Tolerating well    2. Anemia of chronic kidney disease:  - hemoglobin currently 10.5 - low dose EPO with HD  3. Secondary Hyperparathyroidism:  Continue Renvela 1600 mg by mouth 3 times a day once able to eat normal diet   LOS: 2 [name] 1/13/201711:54 AM

## 2015-10-05 NOTE — Discharge Summary (Signed)
[facility] NAME: [name]    MR#:  [id number]  DATE OF BIRTH:  03/10/1961  DATE OF ADMISSION:  10/03/2015 ADMITTING PHYSICIAN: Robin Coon, MD  DATE OF DISCHARGE: 10/05/15  PRIMARY CARE PHYSICIAN: Robin Sia, MD    ADMISSION DIAGNOSIS:  Hyperkalemia [E87.5] Bradycardia [R00.1]  DISCHARGE DIAGNOSIS:  Acute encephalopathy-resolved Severe bradycardia-resolved Hyperkalemia-resolved DM-2 now OFF insulin due to low/stable sugars ESRD on HD  SECONDARY DIAGNOSIS:   Past Medical History  Diagnosis Date  . Asthma   . Collagen vascular disease ([place])   . Diabetes mellitus without complication ([place])   . Hypertension   . Coronary artery disease     a. cath 2013: stenting to RCA (report not available); b. cath 2014: LM nl, pLAD 40%, mLAD nl, ost LCx 40%, mid LCx nl, pRCA 30% @ site of prior stent, mRCA 50%  . Diabetic neuropathy ([place])   . ESRD (end stage renal disease) on dialysis ([place])     M-W-F  . GERD (gastroesophageal reflux disease)   . Hx of pancreatitis 2015  . Myocardial infarction ([place])   . Pneumonia   . Mitral regurgitation     a. echo 10/2013: EF 62%, noWMA, mildly dilated LA, mild to mod MR/TR, GR1DD  . COPD (chronic obstructive pulmonary disease) ([place])   . chronic diastolic CHF [id number]  . dialysis 2006  . Depression   . Anxiety   . Arthritis   . Headache   . Anemia     HOSPITAL COURSE:   [name] is a 55 y.o. female who presents unresponsive. Patient was found unresponsive and then transported by EMS to the ED for evaluation. She is an end-stage renal disease patient on hemodialysis who missed her last dialysis session. Here she was found to be bradycardic with a junctional rhythm in the 30s. Her potassium was found to be 6.6  1. acute encephalopathy/Unresponsiveness  -resolved -off dopamine -Blood pressure much improved. Heart rate in the 60s. - off transcutaneous pacing and pressor   -bp stable -Seen by cardiology. -will change coreg to 3.125 mg bid (relative low bop and bradycardia)  2.End-stage renal disease on hemodialysis ([place]) with severe hypokalemia due to missing dialysis. -Appreciated urgent nephrology consult for dialysis -Patient currently getting dialyzed. Her potassium is down from 6.6-5.5 -patient was given insulin and albuterol and bicarbonate  3. Bradycardia - junctional rhythm requiring transcutaneous pacing.  -off transvenous pacer.  -HR 55-63  4. Severe hypotension. Blood pressure was in the 123456 to 0000000 systolic -off pressores -now only on po low dose coreg. Held other bp meds at discharge  5. DM (diabetes mellitus) ([place]) - correctional insulin with glucose checks every 6 hours while nothing by mouth and hypoglycemic protocol. -Sugars on the lower side. -Renal diet -pt advised to be OFF insulin for now  6. HTN (hypertension) - hypotensive on pressors at this time. Hold all antihypertensive medications  7. Coronary artery disease involving native coronary artery of native heart without angina pectoris  - continue home meds   8. Intractable nausea -Patient prefers when necessary Phenergan  Overall improving PT to see today D/c later after Hemodialysis  CONSULTS OBTAINED:  Treatment Team:  Dionisio David, MD  DRUG ALLERGIES:   Allergies  Allergen Reactions  . Compazine [Prochlorperazine Edisylate] Anaphylaxis and Nausea And Vomiting  . Ace Inhibitors Swelling  . Ativan [Lorazepam] Other (See Comments)    Reaction:  Hallucinations and headaches  . Codeine Nausea  And Vomiting  . Gabapentin Other (See Comments)    Reaction:  Unknown   . Losartan Other (See Comments)    Reaction:  Unknown   . Ondansetron Other (See Comments)    Reaction:  Unknown   . Prochlorperazine Other (See Comments)    Reaction:  Unknown   . Scopolamine Other (See Comments)    Reaction:  Unknown   . Zofran [Ondansetron Hcl] Other (See Comments)     Reaction:  hallucinations   . Oxycodone Anxiety  . Tape Rash    DISCHARGE MEDICATIONS:   Current Discharge Medication List    START taking these medications   Details  Amino Acids-Protein Hydrolys (FEEDING SUPPLEMENT, PRO-STAT SUGAR FREE 64,) LIQD Take 30 mLs by mouth 2 (two) times daily between meals. Qty: 900 mL, Refills: 0      CONTINUE these medications which have CHANGED   Details  carvedilol (COREG) 3.125 MG tablet Take 8 tablets (25 mg total) by mouth 2 (two) times daily. Qty: 60 tablet, Refills: 0      CONTINUE these medications which have NOT CHANGED   Details  aspirin EC 81 MG tablet Take 81 mg by mouth daily.    atorvastatin (LIPITOR) 20 MG tablet Take 20 mg by mouth every evening.    b complex-vitamin c-folic acid (NEPHRO-VITE) 0.8 MG TABS tablet Take 1 tablet by mouth daily.    cetirizine (ZYRTEC) 10 MG tablet Take 10 mg by mouth daily.    Cholecalciferol (VITAMIN D3) 400 UNITS tablet Take 400 Units by mouth daily.    citalopram (CELEXA) 20 MG tablet Take 20 mg by mouth daily.    clopidogrel (PLAVIX) 75 MG tablet Take 75 mg by mouth daily.    glucose 4 GM chewable tablet Chew 3-4 tablets by mouth as needed for low blood sugar (if pts blood sugar is less than 60).    HYDROcodone-acetaminophen (NORCO/VICODIN) 5-325 MG per tablet Take 1 tablet by mouth 3 (three) times daily as needed for severe pain.     hydrocortisone (ANUSOL-HC) 25 MG suppository Place 25 mg rectally 2 (two) times daily.    metoCLOPramide (REGLAN) 5 MG tablet Take 1 tablet (5 mg total) by mouth 4 (four) times daily -  before meals and at bedtime. Qty: 40 tablet, Refills: 0    nitroGLYCERIN (NITROSTAT) 0.4 MG SL tablet Place 0.4 mg under the tongue every 5 (five) minutes x 3 doses as needed for chest pain. *Max 3 doses per episode*    pregabalin (LYRICA) 25 MG capsule Take 25 mg by mouth 2 (two) times daily as needed (for leg pain).    promethazine (PHENERGAN) 12.5 MG tablet Take 12.5 mg  by mouth every 6 (six) hours as needed for nausea or vomiting.    ranitidine (ZANTAC) 150 MG tablet Take 150 mg by mouth daily.     ranolazine (RANEXA) 1000 MG SR tablet Take 1,000 mg by mouth 2 (two) times daily.    vitamin B-12 (CYANOCOBALAMIN) 1000 MCG tablet Take 1,000 mcg by mouth daily.    albuterol (PROVENTIL) (2.5 MG/3ML) 0.083% nebulizer solution Take 2.5 mg by nebulization every 4 (four) hours as needed for wheezing or shortness of breath.    lipase/protease/amylase (CREON) 12000 UNITS CPEP capsule Take 12,000 Units by mouth 3 (three) times daily with meals.     sevelamer carbonate (RENVELA) 800 MG tablet Take 1,600 mg by mouth 3 (three) times daily with meals.       STOP taking these medications  amLODipine (NORVASC) 10 MG tablet      insulin aspart (NOVOLOG) 100 UNIT/ML injection      insulin detemir (LEVEMIR) 100 UNIT/ML injection         If you experience worsening of your admission symptoms, develop shortness of breath, life threatening emergency, suicidal or homicidal thoughts you must seek medical attention immediately by calling 911 or calling your MD immediately  if symptoms less severe.  You Must read complete instructions/literature along with all the possible adverse reactions/side effects for all the Medicines you take and that have been prescribed to you. Take any new Medicines after you have completely understood and accept all the possible adverse reactions/side effects.   Please note  You were cared for by a hospitalist during your hospital stay. If you have any questions about your discharge medications or the care you received while you were in the hospital after you are discharged, you can call the unit and asked to speak with the hospitalist on call if the hospitalist that took care of you is not available. Once you are discharged, your primary care physician will handle any further medical issues. Please note that NO REFILLS for any discharge  medications will be authorized once you are discharged, as it is imperative that you return to your primary care physician (or establish a relationship with a primary care physician if you do not have one) for your aftercare needs so that they can reassess your need for medications and monitor your lab values. Today   SUBJECTIVE   Feels better but weak  VITAL SIGNS:  Blood pressure 118/61, pulse 59, temperature 98 F (36.7 C), temperature source Oral, resp. rate 16, height 5\' 1"  (1.549 m), weight 85.866 kg (189 lb 4.8 oz), SpO2 98 %.  I/O:   Intake/Output Summary (Last 24 hours) at 10/05/15 0722 Last data filed at 10/05/15 0413  Gross per 24 hour  Intake   1043 ml  Output      0 ml  Net   1043 ml    PHYSICAL EXAMINATION:  GENERAL:  55 y.o.-year-old patient lying in the bed with no acute distress.  EYES: Pupils equal, round, reactive to light and accommodation. No scleral icterus. Extraocular muscles intact.  HEENT: Head atraumatic, normocephalic. Oropharynx and nasopharynx clear.  NECK:  Supple, no jugular venous distention. No thyroid enlargement, no tenderness.  LUNGS: Normal breath sounds bilaterally, no wheezing, rales,rhonchi or crepitation. No use of accessory muscles of respiration.  CARDIOVASCULAR: S1, S2 normal. No murmurs, rubs, or gallops.  ABDOMEN: Soft, non-tender, non-distended. Bowel sounds present. No organomegaly or mass.  EXTREMITIES: No pedal edema, cyanosis, or clubbing.  NEUROLOGIC: Cranial nerves II through XII are intact. Muscle strength 5/5 in all extremities. Sensation intact. Gait not checked.  PSYCHIATRIC: The patient is alert and oriented x 3.  SKIN: No obvious rash, lesion, or ulcer.   DATA REVIEW:   CBC   Recent Labs Lab 10/03/15 0445  WBC 8.8  HGB 10.6*  HCT 32.3*  PLT 213    Chemistries   Recent Labs Lab 10/03/15 0116  10/04/15 0707  NA 133*  < > 132*  K 6.6*  < > 4.5  CL 96*  < > 94*  CO2 25  < > 32  GLUCOSE 142*  < > 87  BUN  37*  < > 21*  CREATININE 6.50*  < > 4.62*  CALCIUM 8.3*  < > 8.0*  AST 43*  --   --   ALT 17  --   --  ALKPHOS 507*  --   --   BILITOT 1.1  --   --   < > = values in this interval not displayed.  Microbiology Results   Recent Results (from the past 240 hour(s))  MRSA PCR Screening     Status: None   Collection Time: 10/03/15  4:00 AM  Result Value Ref Range Status   MRSA by PCR NEGATIVE NEGATIVE Final    Comment:        The GeneXpert MRSA Assay (FDA approved for NASAL specimens only), is one component of a comprehensive MRSA colonization surveillance program. It is not intended to diagnose MRSA infection nor to guide or monitor treatment for MRSA infections.     RADIOLOGY:  No results found.   Management plans discussed with the patient, family and they are in agreement.  CODE STATUS:     Code Status Orders        Start     Ordered   10/03/15 0356  Full code   Continuous     10/03/15 0355    Code Status History    Date Active Date Inactive Code Status Order ID Comments User Context   08/31/2015  4:00 PM 09/03/2015 10:09 PM Full Code [id number]  Demetrios Loll, MD ED   07/26/2015  3:28 PM 07/26/2015  9:36 PM Full Code [id number]  Dionisio David, MD Inpatient   07/22/2015  5:36 PM 07/26/2015  3:28 PM Full Code [id number]  Vaughan Basta, MD Inpatient   06/05/2015  8:39 PM 06/07/2015  8:03 PM Full Code [id number]  Bettey Costa, MD Inpatient   05/21/2015  9:42 PM 05/24/2015  4:26 PM Full Code [id number]  Henreitta Leber, MD Inpatient   04/24/2015  8:55 AM 04/26/2015 10:11 PM Full Code [id number]  Demetrios Loll, MD Inpatient   04/01/2015  3:58 PM 04/05/2015  8:07 PM Full Code [id number]  Epifanio Lesches, MD ED   03/06/2015  8:05 AM 03/09/2015  9:11 PM Full Code [id number]  Juluis Mire, MD Inpatient   02/19/2015  4:14 PM 02/24/2015  5:41 PM Full Code [id number]  Aldean Jewett, MD Inpatient      TOTAL TIME TAKING CARE OF THIS PATIENT: 35 minutes.    Donaven Criswell M.D on 10/05/2015 at  7:22 AM  Between 7am to 6pm - Pager - [phone] After 6pm go to [email or web address] - password [email or web address] [facility]  Office  [phone]  CC: Primary care physician; Robin Sia, MD

## 2015-10-05 NOTE — Care Management (Signed)
Informed that patient is saying she can not discharge due to weakness and diarrhea.  She adamantly declines any discussion of skilled nursing placement.  Discussed her right to appeal her discharge if she does not feel she is medically stable to go .  Patient is adamant that she will discharge home- she declines the appeal several times in the presence of charge nurse Carlyle Dolly.     Her son will transport

## 2015-10-05 NOTE — Care Management (Signed)
Patient is agreeable to home health services.  She does not wish to go to skilled nursing but feels she needs assistance.  Agreeable to home health and agency preference is [facility].  Obtained order for SN PT OT Aide and SW.  Patient want to try again to see if she qualifies for medicaid pcs.  Says she was declined and does not know why.  Called and faxed referral to [facility]

## 2015-10-05 NOTE — Progress Notes (Signed)
Inpatient Diabetes Program Recommendations  AACE/ADA: New Consensus Statement on Inpatient Glycemic Control (2015)  Target Ranges:  Prepandial:   less than 140 mg/dL      Peak postprandial:   less than 180 mg/dL (1-2 hours)      Critically ill patients:  140 - 180 mg/dL   Review of Glycemic Control  Results for [name], [name] (MRN [id number]) as of 10/05/2015 14:01  Ref. Range 10/05/2015 04:11 10/05/2015 04:37 10/05/2015 07:19 10/05/2015 08:44 10/05/2015 10:26  Glucose-Capillary Latest Ref Range: 65-99 mg/dL 68 116 (H) 89 76 108 (H)    Diabetes history: Type 2 Outpatient Diabetes medications: Levemir 6 units qhs, Novolog 3 units tid Current orders for Inpatient glycemic control: Novolog 0-9 units q6h  Inpatient Diabetes Program Recommendations: Consider changing Novolog to 0-5 units tid with meals beginning when CBG > 150mg /dl.  Be sure insulin is given within 1 hour of the blood sugar is being checked.  Consider setting a perameter that the patient is eating 50 % of her meal.  Gentry Fitz, RN, BA, [place], CDE Diabetes Coordinator Inpatient Diabetes Program  [phone] (Team Pager) [phone] ([place]) 10/05/2015 2:02 PM

## 2015-10-05 NOTE — Progress Notes (Signed)
Talked to [facility] about pt not wanting to d/c. Nann said pt is going home with PT. Ok to d/c.

## 2015-10-05 NOTE — Progress Notes (Signed)
MD Posey Pronto was notified that pt is having loose stools. Immodium was given.

## 2015-10-05 NOTE — Progress Notes (Signed)
Hypoglycemic Event  CBG: 56  Treatment: graham crackers with peanut butter, ginger ale as requested  Symptoms: pt reports feeling "hot and shaky"  Follow-up CBG: Time:0005 CBG Result:40  Treatment Dextrose 50% 12.5 grams (25 ml)  Follow-up CBG: Time:0033 CBG Result:108  Possible Reasons for Event:pt reports poor po intake at dinnertime.   Comments/MD notified  Dr Lavetta Nielsen notified;no new orders.    Tawni Millers

## 2015-10-05 NOTE — Discharge Instructions (Signed)
KEEP LOG OF YOUR SUGARS AT HOME EAT SMALL FREQUENT MEALS TO AVOID HYPOGLYCEMIA

## 2015-10-05 NOTE — Care Management Important Message (Signed)
Important Message  Patient Details  Name: [name] MRN: [id number] Date of Birth: [birth year]   Medicare Important Message Given:  Yes  Notice given to patient    Beau Fanny, RN 10/05/2015, 8:53 AM

## 2015-10-05 NOTE — Progress Notes (Signed)
Nann talked to pt about appealing d/c. Pt refuses to go to a SNF. Pt wants to go home with home health. No further orders. IV and tele removed. Discharge instructions given to pt.

## 2015-10-05 NOTE — Progress Notes (Signed)
Cardiac point of view patient is fine to go home with f/u in office next Tuesday at 10 am. She had nausia this morning, when I saw her but may be should be  sent home ant-emetic medications.She had labile, after episode of hyperkalemia, but that has resolved now after dialysisis.

## 2015-10-05 NOTE — Progress Notes (Signed)
Hypoglycemic Event  CBG: 68  Treatment: 4 ounces ginger ale  Symptoms:None noted on awakening easily from sleep for q 4 hour CBG  Follow-up CBG: [id number] CBG Result:116  Possible Reasons for Event: poor intake with dinner yesterday  Comments/MD notified:Dr Hower made aware of previous episode and interventions.    Tawni Millers

## 2015-10-07 ENCOUNTER — Emergency Department: Payer: Medicare Other

## 2015-10-07 ENCOUNTER — Encounter: Payer: Self-pay | Admitting: Emergency Medicine

## 2015-10-07 ENCOUNTER — Inpatient Hospital Stay
Admission: EM | Admit: 2015-10-07 | Discharge: 2015-10-12 | DRG: 291 | Disposition: A | Discharge status: Home Health | Payer: Medicare Other | Attending: Internal Medicine | Admitting: Internal Medicine

## 2015-10-07 DIAGNOSIS — N186 End stage renal disease: Secondary | ICD-10-CM | POA: Diagnosis present

## 2015-10-07 DIAGNOSIS — Z7951 Long term (current) use of inhaled steroids: Secondary | ICD-10-CM

## 2015-10-07 DIAGNOSIS — Z9489 Other transplanted organ and tissue status: Secondary | ICD-10-CM | POA: Diagnosis not present

## 2015-10-07 DIAGNOSIS — I5033 Acute on chronic diastolic (congestive) heart failure: Secondary | ICD-10-CM | POA: Diagnosis present

## 2015-10-07 DIAGNOSIS — J90 Pleural effusion, not elsewhere classified: Secondary | ICD-10-CM | POA: Diagnosis present

## 2015-10-07 DIAGNOSIS — I132 Hypertensive heart and chronic kidney disease with heart failure and with stage 5 chronic kidney disease, or end stage renal disease: Secondary | ICD-10-CM | POA: Diagnosis present

## 2015-10-07 DIAGNOSIS — F419 Anxiety disorder, unspecified: Secondary | ICD-10-CM | POA: Diagnosis present

## 2015-10-07 DIAGNOSIS — I429 Cardiomyopathy, unspecified: Secondary | ICD-10-CM | POA: Diagnosis present

## 2015-10-07 DIAGNOSIS — J45909 Unspecified asthma, uncomplicated: Secondary | ICD-10-CM | POA: Diagnosis present

## 2015-10-07 DIAGNOSIS — Z79899 Other long term (current) drug therapy: Secondary | ICD-10-CM | POA: Diagnosis not present

## 2015-10-07 DIAGNOSIS — J810 Acute pulmonary edema: Secondary | ICD-10-CM | POA: Diagnosis present

## 2015-10-07 DIAGNOSIS — F17210 Nicotine dependence, cigarettes, uncomplicated: Secondary | ICD-10-CM | POA: Diagnosis present

## 2015-10-07 DIAGNOSIS — Z885 Allergy status to narcotic agent status: Secondary | ICD-10-CM

## 2015-10-07 DIAGNOSIS — Z955 Presence of coronary angioplasty implant and graft: Secondary | ICD-10-CM

## 2015-10-07 DIAGNOSIS — R0602 Shortness of breath: Secondary | ICD-10-CM

## 2015-10-07 DIAGNOSIS — Z992 Dependence on renal dialysis: Secondary | ICD-10-CM | POA: Diagnosis not present

## 2015-10-07 DIAGNOSIS — Z7902 Long term (current) use of antithrombotics/antiplatelets: Secondary | ICD-10-CM | POA: Diagnosis not present

## 2015-10-07 DIAGNOSIS — E785 Hyperlipidemia, unspecified: Secondary | ICD-10-CM | POA: Diagnosis present

## 2015-10-07 DIAGNOSIS — J9600 Acute respiratory failure, unspecified whether with hypoxia or hypercapnia: Secondary | ICD-10-CM | POA: Diagnosis present

## 2015-10-07 DIAGNOSIS — K219 Gastro-esophageal reflux disease without esophagitis: Secondary | ICD-10-CM | POA: Diagnosis present

## 2015-10-07 DIAGNOSIS — J9601 Acute respiratory failure with hypoxia: Secondary | ICD-10-CM | POA: Diagnosis present

## 2015-10-07 DIAGNOSIS — I252 Old myocardial infarction: Secondary | ICD-10-CM

## 2015-10-07 DIAGNOSIS — E1122 Type 2 diabetes mellitus with diabetic chronic kidney disease: Secondary | ICD-10-CM | POA: Diagnosis present

## 2015-10-07 DIAGNOSIS — J449 Chronic obstructive pulmonary disease, unspecified: Secondary | ICD-10-CM | POA: Diagnosis present

## 2015-10-07 DIAGNOSIS — R001 Bradycardia, unspecified: Secondary | ICD-10-CM | POA: Diagnosis present

## 2015-10-07 DIAGNOSIS — E875 Hyperkalemia: Secondary | ICD-10-CM | POA: Diagnosis present

## 2015-10-07 DIAGNOSIS — N2581 Secondary hyperparathyroidism of renal origin: Secondary | ICD-10-CM | POA: Diagnosis present

## 2015-10-07 DIAGNOSIS — Z888 Allergy status to other drugs, medicaments and biological substances status: Secondary | ICD-10-CM

## 2015-10-07 DIAGNOSIS — K3184 Gastroparesis: Secondary | ICD-10-CM | POA: Diagnosis present

## 2015-10-07 DIAGNOSIS — F329 Major depressive disorder, single episode, unspecified: Secondary | ICD-10-CM | POA: Diagnosis present

## 2015-10-07 DIAGNOSIS — I2510 Atherosclerotic heart disease of native coronary artery without angina pectoris: Secondary | ICD-10-CM | POA: Diagnosis present

## 2015-10-07 DIAGNOSIS — Z794 Long term (current) use of insulin: Secondary | ICD-10-CM

## 2015-10-07 DIAGNOSIS — K589 Irritable bowel syndrome without diarrhea: Secondary | ICD-10-CM | POA: Diagnosis present

## 2015-10-07 DIAGNOSIS — D631 Anemia in chronic kidney disease: Secondary | ICD-10-CM | POA: Diagnosis present

## 2015-10-07 DIAGNOSIS — E11319 Type 2 diabetes mellitus with unspecified diabetic retinopathy without macular edema: Secondary | ICD-10-CM | POA: Diagnosis present

## 2015-10-07 DIAGNOSIS — Z7982 Long term (current) use of aspirin: Secondary | ICD-10-CM

## 2015-10-07 DIAGNOSIS — E1143 Type 2 diabetes mellitus with diabetic autonomic (poly)neuropathy: Secondary | ICD-10-CM | POA: Diagnosis present

## 2015-10-07 DIAGNOSIS — I340 Nonrheumatic mitral (valve) insufficiency: Secondary | ICD-10-CM | POA: Diagnosis present

## 2015-10-07 DIAGNOSIS — J9602 Acute respiratory failure with hypercapnia: Secondary | ICD-10-CM | POA: Diagnosis present

## 2015-10-07 LAB — BLOOD GAS, ARTERIAL
Acid-Base Excess: 8.5 mmol/L — ABNORMAL HIGH (ref 0.0–3.0)
Bicarbonate: 32.8 mEq/L — ABNORMAL HIGH (ref 21.0–28.0)
Expiratory PAP: 5
FIO2: 0.5
Mechanical Rate: 10
O2 Saturation: 98.1 %
Patient temperature: 37
pCO2 arterial: 43 mmHg (ref 32.0–48.0)
pH, Arterial: 7.49 — ABNORMAL HIGH (ref 7.350–7.450)
pO2, Arterial: 98 mmHg (ref 83.0–108.0)

## 2015-10-07 LAB — TROPONIN I: Troponin I: 0.03 ng/mL (ref <0.031)

## 2015-10-07 LAB — CBC
HCT: 31.2 % — ABNORMAL LOW (ref 35.0–47.0)
Hemoglobin: 10.3 g/dL — ABNORMAL LOW (ref 12.0–16.0)
MCH: 27.2 pg (ref 26.0–34.0)
MCHC: 32.9 g/dL (ref 32.0–36.0)
MCV: 82.7 fL (ref 80.0–100.0)
Platelets: 191 10*3/uL (ref 150–440)
RBC: 3.77 MIL/uL — ABNORMAL LOW (ref 3.80–5.20)
RDW: 21.3 % — ABNORMAL HIGH (ref 11.5–14.5)
WBC: 6 10*3/uL (ref 3.6–11.0)

## 2015-10-07 LAB — COMPREHENSIVE METABOLIC PANEL
ALT: 26 U/L (ref 14–54)
AST: 60 U/L — ABNORMAL HIGH (ref 15–41)
Albumin: 3.1 g/dL — ABNORMAL LOW (ref 3.5–5.0)
Alkaline Phosphatase: 463 U/L — ABNORMAL HIGH (ref 38–126)
Anion gap: 11 (ref 5–15)
BUN: 17 mg/dL (ref 6–20)
CO2: 30 mmol/L (ref 22–32)
Calcium: 8.9 mg/dL (ref 8.9–10.3)
Chloride: 93 mmol/L — ABNORMAL LOW (ref 101–111)
Creatinine, Ser: 4.44 mg/dL — ABNORMAL HIGH (ref 0.44–1.00)
GFR calc Af Amer: 12 mL/min — ABNORMAL LOW (ref >60)
GFR calc non Af Amer: 10 mL/min — ABNORMAL LOW (ref >60)
Glucose, Bld: 130 mg/dL — ABNORMAL HIGH (ref 65–99)
Potassium: 4.1 mmol/L (ref 3.5–5.1)
Sodium: 134 mmol/L — ABNORMAL LOW (ref 135–145)
Total Bilirubin: 1.2 mg/dL (ref 0.3–1.2)
Total Protein: 7.8 g/dL (ref 6.5–8.1)

## 2015-10-07 LAB — MAGNESIUM: Magnesium: 2.3 mg/dL (ref 1.7–2.4)

## 2015-10-07 LAB — PHOSPHORUS: Phosphorus: 4.4 mg/dL (ref 2.5–4.6)

## 2015-10-07 LAB — GLUCOSE, CAPILLARY: Glucose-Capillary: 82 mg/dL (ref 65–99)

## 2015-10-07 LAB — BRAIN NATRIURETIC PEPTIDE: B Natriuretic Peptide: 4204 pg/mL — ABNORMAL HIGH (ref 0.0–100.0)

## 2015-10-07 LAB — MRSA PCR SCREENING: MRSA by PCR: NEGATIVE

## 2015-10-07 MED ORDER — SEVELAMER CARBONATE 800 MG PO TABS
1600.0000 mg | ORAL_TABLET | Freq: Three times a day (TID) | ORAL | Status: DC
  Administered 2015-10-07 – 2015-10-11 (×8): 1600 mg via ORAL
  Filled 2015-10-07 (×9): qty 2

## 2015-10-07 MED ORDER — METOCLOPRAMIDE HCL 5 MG PO TABS
5.0000 mg | ORAL_TABLET | Freq: Three times a day (TID) | ORAL | Status: DC
  Administered 2015-10-07 – 2015-10-11 (×13): 5 mg via ORAL
  Filled 2015-10-07 (×14): qty 1

## 2015-10-07 MED ORDER — HEPARIN SODIUM (PORCINE) 5000 UNIT/ML IJ SOLN
5000.0000 [IU] | Freq: Three times a day (TID) | INTRAMUSCULAR | Status: DC
  Administered 2015-10-07 – 2015-10-12 (×12): 5000 [IU] via SUBCUTANEOUS
  Filled 2015-10-07 (×12): qty 1

## 2015-10-07 MED ORDER — SODIUM CHLORIDE 0.9 % IJ SOLN
3.0000 mL | Freq: Two times a day (BID) | INTRAMUSCULAR | Status: DC
  Administered 2015-10-07 – 2015-10-11 (×8): 3 mL via INTRAVENOUS

## 2015-10-07 MED ORDER — RENA-VITE PO TABS
1.0000 | ORAL_TABLET | Freq: Every day | ORAL | Status: DC
  Administered 2015-10-07 – 2015-10-11 (×6): 1 via ORAL
  Filled 2015-10-07 (×6): qty 1

## 2015-10-07 MED ORDER — CARVEDILOL 3.125 MG PO TABS
3.1250 mg | ORAL_TABLET | Freq: Two times a day (BID) | ORAL | Status: DC
  Administered 2015-10-07 – 2015-10-08 (×3): 3.125 mg via ORAL
  Filled 2015-10-07 (×3): qty 1

## 2015-10-07 MED ORDER — HYDROCORTISONE ACETATE 25 MG RE SUPP
25.0000 mg | Freq: Two times a day (BID) | RECTAL | Status: DC
  Filled 2015-10-07 (×10): qty 1

## 2015-10-07 MED ORDER — HYDROCODONE-ACETAMINOPHEN 5-325 MG PO TABS
1.0000 | ORAL_TABLET | Freq: Three times a day (TID) | ORAL | Status: DC | PRN
  Administered 2015-10-07 – 2015-10-08 (×3): 1 via ORAL
  Filled 2015-10-07 (×3): qty 1

## 2015-10-07 MED ORDER — EPOETIN ALFA 10000 UNIT/ML IJ SOLN
4000.0000 [IU] | INTRAMUSCULAR | Status: DC
  Administered 2015-10-08 – 2015-10-12 (×3): 4000 [IU] via INTRAVENOUS
  Filled 2015-10-07 (×2): qty 0.4

## 2015-10-07 MED ORDER — CHOLECALCIFEROL 10 MCG (400 UNIT) PO TABS
400.0000 [IU] | ORAL_TABLET | Freq: Every day | ORAL | Status: DC
  Administered 2015-10-08 – 2015-10-11 (×4): 400 [IU] via ORAL
  Filled 2015-10-07 (×5): qty 1

## 2015-10-07 MED ORDER — VITAMIN B-12 1000 MCG PO TABS
1000.0000 ug | ORAL_TABLET | Freq: Every day | ORAL | Status: DC
  Administered 2015-10-08 – 2015-10-11 (×4): 1000 ug via ORAL
  Filled 2015-10-07 (×5): qty 1

## 2015-10-07 MED ORDER — FAMOTIDINE 20 MG PO TABS
10.0000 mg | ORAL_TABLET | Freq: Every day | ORAL | Status: DC
  Administered 2015-10-08 – 2015-10-11 (×4): 10 mg via ORAL
  Filled 2015-10-07 (×5): qty 1

## 2015-10-07 MED ORDER — PROMETHAZINE HCL 25 MG PO TABS
12.5000 mg | ORAL_TABLET | Freq: Four times a day (QID) | ORAL | Status: DC | PRN
Start: 2015-10-07 — End: 2015-10-12
  Administered 2015-10-07 – 2015-10-08 (×3): 12.5 mg via ORAL
  Filled 2015-10-07 (×3): qty 1

## 2015-10-07 MED ORDER — HYDRALAZINE HCL 25 MG PO TABS
25.0000 mg | ORAL_TABLET | Freq: Three times a day (TID) | ORAL | Status: DC
  Administered 2015-10-07 – 2015-10-12 (×14): 25 mg via ORAL
  Filled 2015-10-07 (×14): qty 1

## 2015-10-07 MED ORDER — PREGABALIN 25 MG PO CAPS: 25.0000 mg | ORAL_CAPSULE | Freq: Two times a day (BID) | ORAL | Status: DC | PRN

## 2015-10-07 MED ORDER — SODIUM CHLORIDE 0.9 % IJ SOLN: 3.0000 mL | INTRAMUSCULAR | Status: DC | PRN

## 2015-10-07 MED ORDER — ALBUTEROL SULFATE (2.5 MG/3ML) 0.083% IN NEBU
2.5000 mg | INHALATION_SOLUTION | RESPIRATORY_TRACT | Status: DC | PRN
  Administered 2015-10-07 – 2015-10-08 (×2): 2.5 mg via RESPIRATORY_TRACT
  Filled 2015-10-07 (×2): qty 3

## 2015-10-07 MED ORDER — CITALOPRAM HYDROBROMIDE 20 MG PO TABS
20.0000 mg | ORAL_TABLET | Freq: Every day | ORAL | Status: DC
  Administered 2015-10-08 – 2015-10-11 (×4): 20 mg via ORAL
  Filled 2015-10-07 (×5): qty 1

## 2015-10-07 MED ORDER — CLOPIDOGREL BISULFATE 75 MG PO TABS
75.0000 mg | ORAL_TABLET | Freq: Every day | ORAL | Status: DC
  Administered 2015-10-08 – 2015-10-11 (×4): 75 mg via ORAL
  Filled 2015-10-07 (×5): qty 1

## 2015-10-07 MED ORDER — ACETAMINOPHEN 650 MG RE SUPP: 650.0000 mg | Freq: Four times a day (QID) | RECTAL | Status: DC | PRN

## 2015-10-07 MED ORDER — ATORVASTATIN CALCIUM 20 MG PO TABS
20.0000 mg | ORAL_TABLET | Freq: Every evening | ORAL | Status: DC
  Administered 2015-10-07 – 2015-10-10 (×4): 20 mg via ORAL
  Filled 2015-10-07 (×4): qty 1

## 2015-10-07 MED ORDER — LORATADINE 10 MG PO TABS
10.0000 mg | ORAL_TABLET | Freq: Every day | ORAL | Status: DC
Start: 2015-10-07 — End: 2015-10-12
  Administered 2015-10-08 – 2015-10-11 (×4): 10 mg via ORAL
  Filled 2015-10-07 (×5): qty 1

## 2015-10-07 MED ORDER — SODIUM CHLORIDE 0.9 % IV SOLN: 250.0000 mL | INTRAVENOUS | Status: DC | PRN

## 2015-10-07 MED ORDER — ACETAMINOPHEN 325 MG PO TABS
650.0000 mg | ORAL_TABLET | Freq: Four times a day (QID) | ORAL | Status: DC | PRN
  Administered 2015-10-09 – 2015-10-12 (×4): 650 mg via ORAL
  Filled 2015-10-07 (×5): qty 2

## 2015-10-07 MED ORDER — PANCRELIPASE (LIP-PROT-AMYL) 12000-38000 UNITS PO CPEP
12000.0000 [IU] | ORAL_CAPSULE | Freq: Three times a day (TID) | ORAL | Status: DC
  Administered 2015-10-07 – 2015-10-11 (×8): 12000 [IU] via ORAL
  Filled 2015-10-07 (×15): qty 1

## 2015-10-07 MED ORDER — ASPIRIN EC 81 MG PO TBEC
81.0000 mg | DELAYED_RELEASE_TABLET | Freq: Every day | ORAL | Status: DC
  Administered 2015-10-08 – 2015-10-11 (×4): 81 mg via ORAL
  Filled 2015-10-07 (×5): qty 1

## 2015-10-07 MED ORDER — PRO-STAT SUGAR FREE PO LIQD
30.0000 mL | Freq: Two times a day (BID) | ORAL | Status: DC
  Administered 2015-10-08 – 2015-10-10 (×4): 30 mL via ORAL

## 2015-10-07 NOTE — Progress Notes (Signed)
Subjective:  Patient was discharged from the hospital on January 13 She was admitted for hyperkalemia and pulmonary edema and was emergently dialyzed early morning of January 11 She presents for similar complaints of fluid overload She denies drinking excess fluid at home Currently requiring oxygen by nasal cannula   Objective:  Vital signs in last 24 hours:  Temp:  [97.9 F (36.6 C)-98.2 F (36.8 C)] 98.2 F (36.8 C) (01/15 0946) Pulse Rate:  [67-115] 79 (01/15 0946) Resp:  [13-21] 18 (01/15 0946) BP: (146-207)/(63-87) 154/63 mmHg (01/15 0946) SpO2:  [94 %-100 %] 94 % (01/15 0946) Weight:  [83.371 kg (183 lb 12.8 oz)-85.73 kg (189 lb)] 83.371 kg (183 lb 12.8 oz) (01/15 0946)  Weight change:  Filed Weights   10/07/15 0343 10/07/15 0946  Weight: 85.73 kg (189 lb) 83.371 kg (183 lb 12.8 oz)    Intake/Output:    Intake/Output Summary (Last 24 hours) at 10/07/15 1108 Last data filed at 10/07/15 0945  Gross per 24 hour  Intake      0 ml  Output      0 ml  Net      0 ml     Physical Exam: General: NAD  HEENT Moist oral mucus membranes  Neck supple  Pulm/lungs tachypneic, diffuse bilateral crackles , O2 by [place]  CVS/Heart Regular, no rub  Abdomen:  Soft, non tender  Extremities: + generalized edema/anasarca   Neurologic:  alert, able to follow commands   Skin: No acute rashes  Access: Left arm AVF       Basic Metabolic Panel:   Recent Labs Lab 10/03/15 0445 10/03/15 1147 10/04/15 0707 10/05/15 1051 10/07/15 0358  NA 134* 137 132* 128* 134*  K 5.5* 4.0 4.5 5.0 4.1  CL 96* 95* 94* 88* 93*  CO2 29 33* 32 29 30  GLUCOSE 55* 70 87 106* 130*  BUN 36* 18 21* 27* 17  CREATININE 6.50* 3.70* 4.62* 5.21* 4.44*  CALCIUM 8.6* 8.8* 8.0* 8.0* 8.9  MG  --   --   --   --  2.3  PHOS  --   --   --  5.5* 4.4     CBC:  Recent Labs Lab 10/03/15 0116 10/03/15 0445 10/05/15 1051 10/07/15 0358  WBC 5.7 8.8 4.0 6.0  NEUTROABS 3.8  --   --   --   HGB 11.1* 10.6*  10.5* 10.3*  HCT 34.4* 32.3* 31.8* 31.2*  MCV 83.9 85.1 83.2 82.7  PLT 202 213 201 191      Microbiology:  Recent Results (from the past 720 hour(s))  MRSA PCR Screening     Status: None   Collection Time: 10/03/15  4:00 AM  Result Value Ref Range Status   MRSA by PCR NEGATIVE NEGATIVE Final    Comment:        The GeneXpert MRSA Assay (FDA approved for NASAL specimens only), is one component of a comprehensive MRSA colonization surveillance program. It is not intended to diagnose MRSA infection nor to guide or monitor treatment for MRSA infections.     Coagulation Studies: No results for input(s): LABPROT, INR in the last 72 hours.  Urinalysis: No results for input(s): COLORURINE, LABSPEC, PHURINE, GLUCOSEU, HGBUR, BILIRUBINUR, KETONESUR, PROTEINUR, UROBILINOGEN, NITRITE, LEUKOCYTESUR in the last 72 hours.  Invalid input(s): APPERANCEUR    Imaging: Dg Chest Portable 1 View  10/07/2015  CLINICAL DATA:  Discharged from hospital October 04, 2014. Acute onset shortness of breath. History of dialysis, diabetes, pancreatitis, hypertension. EXAM:  PORTABLE CHEST 1 VIEW COMPARISON:  Chest radiograph October 03, 2015 FINDINGS: The cardiac silhouette appears mildly enlarged, mediastinal silhouette is unremarkable. Similar pulmonary vascular congestion, interstitial prominence. Small to moderate RIGHT, small LEFT pleural effusions appear slightly decreased. Patchy bibasilar airspace opacities. No pneumothorax. Multiple vascular stents. Osseous structures are nonsuspicious. IMPRESSION: Stable cardiomegaly and findings of CHF with slightly decreased small to moderate RIGHT, small LEFT pleural effusion. Bibasilar atelectasis or confluent edema, less likely pneumonia. Electronically Signed   By: Elon Alas M.D.   On: 10/07/2015 04:21     Medications:     . aspirin EC  81 mg Oral Daily  . atorvastatin  20 mg Oral QPM  . carvedilol  3.125 mg Oral BID  . cholecalciferol  400  Units Oral Daily  . citalopram  20 mg Oral Daily  . clopidogrel  75 mg Oral Daily  . famotidine  10 mg Oral Daily  . feeding supplement (PRO-STAT SUGAR FREE 64)  30 mL Oral BID BM  . heparin  5,000 Units Subcutaneous 3 times per day  . hydrALAZINE  25 mg Oral 3 times per day  . hydrocortisone  25 mg Rectal BID  . lipase/protease/amylase  12,000 Units Oral TID WC  . loratadine  10 mg Oral Daily  . metoCLOPramide  5 mg Oral TID AC & HS  . multivitamin  1 tablet Oral QHS  . sevelamer carbonate  1,600 mg Oral TID WC  . sodium chloride  3 mL Intravenous Q12H  . vitamin B-12  1,000 mcg Oral Daily   sodium chloride, acetaminophen **OR** acetaminophen, albuterol, HYDROcodone-acetaminophen, pregabalin, promethazine, sodium chloride  Assessment/ Plan:  55 y.o. 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 admitted for bradycardia and hyperkalemia    [facility] MWF [place]  1. End Stage Renal Disease:  - Emergent dialysis today for fluid overload  2. Anemia of chronic kidney disease:  - hemoglobin currently 10.3 - low dose EPO with HD  3. Secondary Hyperparathyroidism:  Continue Renvela 1600 mg by mouth 3 times a day once able to eat normal diet  4. Acute pulmonary edema and generalized anasarca - Not sure if patient is compliant with fluid restriction at home - She may need to be placed in nursing home or a supervised setting where her diet and fluid intake can be monitored   LOS: 0 Catriona Dillenbeck 1/15/201711:08 AM

## 2015-10-07 NOTE — Progress Notes (Signed)
Patient admitted to unit. Oriented to room, call bell, and staff. Bed in lowest position. Fall safety plan reviewed. Full assessment to Epic. Skin assessment verified with Lurlean Horns RN. Telemetry box verification with tele clerk and [employer] NT. Will continue to monitor.

## 2015-10-07 NOTE — Progress Notes (Signed)
Weaned patient off bipap to 2 liter nasal cannula. Patient states breathing is much better. Only c/o slight headache. bipap on standby. Dr Corky Downs aware

## 2015-10-07 NOTE — Progress Notes (Signed)
[name] MRN: [id number] DOB: Oct 29, 1960   Cancelled Treatment:    Reason Eval/Treat Not Completed: Patient at procedure or test/unavailable  Pt at dialysis, will likely be there much of the afternoon, will attempt PT eval tomorrow.  Wayne Both, PT, DPT [phone]  Kreg Shropshire 10/07/2015, 12:58 PM

## 2015-10-07 NOTE — ED Notes (Signed)
Per [facility] pt was in tripod position upon arrival. Per EMS pt was given duo nebs x3 and placed on CPAP. Pt is a/o at this time.

## 2015-10-07 NOTE — ED Notes (Signed)
RT in - taking off bi-pap and placing on o2 via nasal cannula at 2 liters.

## 2015-10-07 NOTE — ED Provider Notes (Signed)
[facility] Emergency Department Provider Note  ____________________________________________  Time seen: On arrival, via EMS  I have reviewed the triage vital signs and the nursing notes.   HISTORY  Chief Complaint Respiratory Distress  History limited secondary to distress  HPI [name] is a 55 y.o. female who presents with respiratory distress. Per paramedics family called because of difficulty breathing. When EMS arrived they started the patient on CPAP. Reportedly patient was recently discharged from the hospital and due for dialysis on Monday as she has a history of end-stage renal disease. Records demonstrates that patient presented to the ED in distress last week with elevated potassium and required transcutaneous pacing. Patient was given nebulizers in route this patient also has a history of COPD     Past Medical History  Diagnosis Date  . Asthma   . Collagen vascular disease ([place])   . Diabetes mellitus without complication ([place])   . Hypertension   . Coronary artery disease     a. cath 2013: stenting to RCA (report not available); b. cath 2014: LM nl, pLAD 40%, mLAD nl, ost LCx 40%, mid LCx nl, pRCA 30% @ site of prior stent, mRCA 50%  . Diabetic neuropathy ([place])   . ESRD (end stage renal disease) on dialysis ([place])     M-W-F  . GERD (gastroesophageal reflux disease)   . Hx of pancreatitis 2015  . Myocardial infarction ([place])   . Pneumonia   . Mitral regurgitation     a. echo 10/2013: EF 62%, noWMA, mildly dilated LA, mild to mod MR/TR, GR1DD  . COPD (chronic obstructive pulmonary disease) ([place])   . chronic diastolic CHF [id number]  . dialysis 2006  . Depression   . Anxiety   . Arthritis   . Headache   . Anemia     Patient Active Problem List   Diagnosis Date Noted  . Hyperkalemia 10/03/2015  . C. difficile diarrhea 07/22/2015  . Pneumonia 05/21/2015  . Hypoglycemia 04/24/2015  . Unresponsiveness 04/24/2015  . Bradycardia  04/24/2015  . Hypothermia 04/24/2015  . Acute respiratory failure ([place]) 04/24/2015  . Acute diastolic CHF (congestive heart failure) ([place]) 04/05/2015  . Diabetic gastroparesis ([place]) 04/05/2015  . Hypokalemia 04/05/2015  . Generalized weakness 04/05/2015  . Acute pulmonary edema ([place]) 04/03/2015  . Nausea and vomiting 04/03/2015  . Hypoglycemia associated with diabetes ([place]) 04/03/2015  . Anemia of chronic disease 04/03/2015  . Secondary hyperparathyroidism ([place]) 04/03/2015  . Pressure ulcer 04/02/2015  . Acute respiratory failure with hypoxia ([place]) 04/01/2015  . Adjustment disorder with anxiety 03/14/2015  . Somatic symptom disorder, mild 03/08/2015  . Coronary artery disease involving native coronary artery of native heart without angina pectoris   . Nausea & vomiting 03/06/2015  . Abdominal pain 03/06/2015  . DM (diabetes mellitus) ([place]) 03/06/2015  . HTN (hypertension) 03/06/2015  . Gastroparesis 02/24/2015  . Pleural effusion 02/19/2015  . HCAP (healthcare-associated pneumonia) 02/19/2015  . End-stage renal disease on hemodialysis ([place]) 02/19/2015    Past Surgical History  Procedure Laterality Date  . Cholecystectomy    . Appendectomy    . Abdominal hysterectomy    . Dialysis fistula creation Left     upper arm  . Esophagogastroduodenoscopy N/A 03/08/2015    Procedure: ESOPHAGOGASTRODUODENOSCOPY (EGD);  Surgeon: Manya Silvas, MD;  Location: [facility] ENDOSCOPY;  Service: Endoscopy;  Laterality: N/A;  . Cardiac catheterization Left 07/26/2015    Procedure: Left Heart Cath and Coronary Angiography;  Surgeon: Dionisio David, MD;  Location:  [facility] CV LAB;  Service: Cardiovascular;  Laterality: Left;  Marland Kitchen Eye surgery    . Fecal transplant N/A 08/23/2015    Procedure: FECAL TRANSPLANT;  Surgeon: Manya Silvas, MD;  Location: [facility] ENDOSCOPY;  Service: Endoscopy;  Laterality: N/A;    Current Outpatient Rx  Name  Route  Sig  Dispense  Refill  . albuterol (PROVENTIL) (2.5  MG/3ML) 0.083% nebulizer solution   Nebulization   Take 2.5 mg by nebulization every 4 (four) hours as needed for wheezing or shortness of breath.         . Amino Acids-Protein Hydrolys (FEEDING SUPPLEMENT, PRO-STAT SUGAR FREE 64,) LIQD   Oral   Take 30 mLs by mouth 2 (two) times daily between meals.   900 mL   0   . aspirin EC 81 MG tablet   Oral   Take 81 mg by mouth daily.         Marland Kitchen atorvastatin (LIPITOR) 20 MG tablet   Oral   Take 20 mg by mouth every evening.         Marland Kitchen b complex-vitamin c-folic acid (NEPHRO-VITE) 0.8 MG TABS tablet   Oral   Take 1 tablet by mouth daily.         . carvedilol (COREG) 3.125 MG tablet   Oral   Take 8 tablets (25 mg total) by mouth 2 (two) times daily.   60 tablet   0   . cetirizine (ZYRTEC) 10 MG tablet   Oral   Take 10 mg by mouth daily.         . Cholecalciferol (VITAMIN D3) 400 UNITS tablet   Oral   Take 400 Units by mouth daily.         . citalopram (CELEXA) 20 MG tablet   Oral   Take 20 mg by mouth daily.         . clopidogrel (PLAVIX) 75 MG tablet   Oral   Take 75 mg by mouth daily.         Marland Kitchen glucose 4 GM chewable tablet   Oral   Chew 3-4 tablets by mouth as needed for low blood sugar (if pts blood sugar is less than 60).         Marland Kitchen HYDROcodone-acetaminophen (NORCO/VICODIN) 5-325 MG per tablet   Oral   Take 1 tablet by mouth 3 (three) times daily as needed for severe pain.          . hydrocortisone (ANUSOL-HC) 25 MG suppository   Rectal   Place 25 mg rectally 2 (two) times daily.         . lipase/protease/amylase (CREON) 12000 UNITS CPEP capsule   Oral   Take 12,000 Units by mouth 3 (three) times daily with meals.          . metoCLOPramide (REGLAN) 5 MG tablet   Oral   Take 1 tablet (5 mg total) by mouth 4 (four) times daily -  before meals and at bedtime.   40 tablet   0   . nitroGLYCERIN (NITROSTAT) 0.4 MG SL tablet   Sublingual   Place 0.4 mg under the tongue every 5 (five) minutes x  3 doses as needed for chest pain. *Max 3 doses per episode*         . pregabalin (LYRICA) 25 MG capsule   Oral   Take 25 mg by mouth 2 (two) times daily as needed (for leg pain).         . promethazine (PHENERGAN)  12.5 MG tablet   Oral   Take 12.5 mg by mouth every 6 (six) hours as needed for nausea or vomiting.         . ranitidine (ZANTAC) 150 MG tablet   Oral   Take 150 mg by mouth daily.          . sevelamer carbonate (RENVELA) 800 MG tablet   Oral   Take 1,600 mg by mouth 3 (three) times daily with meals.          . vitamin B-12 (CYANOCOBALAMIN) 1000 MCG tablet   Oral   Take 1,000 mcg by mouth daily.           Allergies Compazine; Ace inhibitors; Ativan; Codeine; Gabapentin; Losartan; Ondansetron; Prochlorperazine; Scopolamine; Zofran; Oxycodone; and Tape  Family History  Problem Relation Age of Onset  . Kidney disease Mother   . Diabetes Mother     Social History Social History  Substance Use Topics  . Smoking status: Current Some Day Smoker -- 0.50 packs/day    Types: Cigarettes    Last Attempt to Quit: 02/13/2015  . Smokeless tobacco: Never Used  . Alcohol Use: No    L5 caveat: Unable to obtain review of systems secondary to distress  Patient denies chest pain Positive cough   ____________________________________________   PHYSICAL EXAM:  VITAL SIGNS: ED Triage Vitals  Enc Vitals Group     BP 10/07/15 0343 207/79 mmHg     Pulse Rate 10/07/15 0343 85     Resp 10/07/15 0343 16     Temp --      Temp src --      SpO2 10/07/15 0343 100 %     Weight 10/07/15 0343 189 lb (85.73 kg)     Height 10/07/15 0343 5\' 7"  (1.702 m)     Head Cir --      Peak Flow --      Pain Score --      Pain Loc --      Pain Edu? --      Excl. in [place]? --      Constitutional: Alert, CPAP in place, significant distress Eyes: Conjunctivae are normal.  ENT   Head: Normocephalic and atraumatic.   Mouth/Throat: Mucous membranes are  moist. Cardiovascular: Normal rate, regular rhythm. Normal and symmetric distal pulses are present in all extremities. Respiratory: Tachypnea, increased respiratory effort, wheezes bilaterally and poor air movement Gastrointestinal: Soft and non-tender in all quadrants. No distention. There is no CVA tenderness. Genitourinary: deferred Musculoskeletal: Significant edema noted bilateral upper and lower extremities Neurologic:  Normal speech and language. No gross focal neurologic deficits are appreciated. Skin:  Skin is warm, dry and intact. No rash noted. Psychiatric: Mood and affect are normal. Patient exhibits appropriate insight and judgment.  ____________________________________________    LABS (pertinent positives/negatives)  Labs Reviewed  CBC - Abnormal; Notable for the following:    RBC 3.77 (*)    Hemoglobin 10.3 (*)    HCT 31.2 (*)    RDW 21.3 (*)    All other components within normal limits  COMPREHENSIVE METABOLIC PANEL - Abnormal; Notable for the following:    Sodium 134 (*)    Chloride 93 (*)    Glucose, Bld 130 (*)    Creatinine, Ser 4.44 (*)    Albumin 3.1 (*)    AST 60 (*)    Alkaline Phosphatase 463 (*)    GFR calc non Af Amer 10 (*)    GFR calc Af Amer 12 (*)  All other components within normal limits  BRAIN NATRIURETIC PEPTIDE - Abnormal; Notable for the following:    B Natriuretic Peptide 4204.0 (*)    All other components within normal limits  BLOOD GAS, ARTERIAL - Abnormal; Notable for the following:    pH, Arterial 7.49 (*)    Bicarbonate 32.8 (*)    Acid-Base Excess 8.5 (*)    All other components within normal limits  TROPONIN I  PHOSPHORUS  MAGNESIUM    ____________________________________________   EKG  ED ECG REPORT I, Lavonia Drafts, the attending physician, personally viewed and interpreted this ECG.   Date: 10/07/2015  EKG Time: 3:47 AM  Rate: 83  Rhythm: normal sinus rhythm  Axis: Normal axis  Intervals:Prolonged QTC  ST&T  Change: Nonspecific   ____________________________________________    RADIOLOGY I have personally reviewed any xrays that were ordered on this patient: Chest x-ray with bibasilar edema  ____________________________________________   PROCEDURES  Procedure(s) performed: none  Critical Care performed: yes  CRITICAL CARE Performed by: Lavonia Drafts   Total critical care time: 35 minutes  Critical care time was exclusive of separately billable procedures and treating other patients.  Critical care was necessary to treat or prevent imminent or life-threatening deterioration.  Critical care was time spent personally by me on the following activities: development of treatment plan with patient and/or surrogate as well as nursing, discussions with consultants, evaluation of patient's response to treatment, examination of patient, obtaining history from patient or surrogate, ordering and performing treatments and interventions, ordering and review of laboratory studies, ordering and review of radiographic studies, pulse oximetry and re-evaluation of patient's condition.   ____________________________________________   INITIAL IMPRESSION / ASSESSMENT AND PLAN / ED COURSE  Pertinent labs & imaging results that were available during my care of the patient were reviewed by me and considered in my medical decision making (see chart for details).  Patient presents in respiratory distress. She is edematous but also has wheezing. We started BiPAP upon arrival. I most suspicious of pulmonary edema but certainly there could be a strong element of COPD as well. Labs, ABG, cxr pending. Prolonged QTC noted on EKG   Discussed with Dr. Candiss Norse of nephrology who will arrange dialysis for the patient. I will admit to internal medicine   ____________________________________________   FINAL CLINICAL IMPRESSION(S) / ED DIAGNOSES  Final diagnoses:  Acute respiratory failure, unspecified whether  with hypoxia or hypercapnia ([place])  Acute pulmonary edema ([facility])     Lavonia Drafts, MD 10/07/15 0510

## 2015-10-07 NOTE — H&P (Signed)
[facility] NAME: [name]    MR#:  [id number]  DATE OF BIRTH:  [birth year]  DATE OF ADMISSION:  10/07/2015  PRIMARY CARE PHYSICIAN: Ellamae Sia, MD   REQUESTING/REFERRING PHYSICIAN: Dr. Corky Downs  CHIEF COMPLAINT:   Shortness of breath acute onset at 2:30 in the morning HISTORY OF PRESENT ILLNESS:  [name]  is a 55 y.o. female with a known history of incisional disease on hemodialysis, COPD, CAD, chronic diastolic congestive heart failure, GERD comes to the emergency room after she woke up around 2:30 with increasing shortness of breath. Patient reports taking 2 breathing treatments did not feel better continued to have chest tightness and shortness of breath which is been family called EMS. Patient was immediately put on BiPAP brought to the emergency room on BiPAP and currently she is on nasal cannula O2 sats 99% on 2 L.  Patient denies any chest pain at present. Her blood pressure is stable. She is being admitted with acute respiratory failure likely secondary to acute on chronic diastolic congestive heart failure.  Chest x-ray remains about the same.  PAST MEDICAL HISTORY:   Past Medical History  Diagnosis Date  . Asthma   . Collagen vascular disease ([place])   . Diabetes mellitus without complication ([employer])   . Hypertension   . Coronary artery disease     a. cath 2013: stenting to RCA (report not available); b. cath 2014: LM nl, pLAD 40%, mLAD nl, ost LCx 40%, mid LCx nl, pRCA 30% @ site of prior stent, mRCA 50%  . Diabetic neuropathy ([place])   . ESRD (end stage renal disease) on dialysis ([place])     M-W-F  . GERD (gastroesophageal reflux disease)   . Hx of pancreatitis 2015  . Myocardial infarction ([place])   . Pneumonia   . Mitral regurgitation     a. echo 10/2013: EF 62%, noWMA, mildly dilated LA, mild to mod MR/TR, GR1DD  . COPD (chronic obstructive pulmonary disease) ([place])   . chronic diastolic CHF 123XX123  .  dialysis 2006  . Depression   . Anxiety   . Arthritis   . Headache   . Anemia     PAST SURGICAL HISTOIRY:   Past Surgical History  Procedure Laterality Date  . Cholecystectomy    . Appendectomy    . Abdominal hysterectomy    . Dialysis fistula creation Left     upper arm  . Esophagogastroduodenoscopy N/A 03/08/2015    Procedure: ESOPHAGOGASTRODUODENOSCOPY (EGD);  Surgeon: Manya Silvas, MD;  Location: [facility] ENDOSCOPY;  Service: Endoscopy;  Laterality: N/A;  . Cardiac catheterization Left 07/26/2015    Procedure: Left Heart Cath and Coronary Angiography;  Surgeon: Dionisio David, MD;  Location: [facility] CV LAB;  Service: Cardiovascular;  Laterality: Left;  Marland Kitchen Eye surgery    . Fecal transplant N/A 08/23/2015    Procedure: FECAL TRANSPLANT;  Surgeon: Manya Silvas, MD;  [facility] ENDOSCOPY;  Service: Endoscopy;  Laterality: N/A;    SOCIAL HISTORY:   Social History  Substance Use Topics  . Smoking status: Current Some Day Smoker -- 0.50 packs/day    Types: Cigarettes    Last Attempt to Quit: 02/13/2015  . Smokeless tobacco: Never Used  . Alcohol Use: No    FAMILY HISTORY:   Family History  Problem Relation Age of Onset  . Kidney disease Mother   . Diabetes Mother     DRUG ALLERGIES:   Allergies  Allergen Reactions  . Compazine [Prochlorperazine Edisylate] Anaphylaxis and Nausea And Vomiting  . Ace Inhibitors Swelling  . Ativan [Lorazepam] Other (See Comments)    Reaction:  Hallucinations and headaches  . Codeine Nausea And Vomiting  . Gabapentin Other (See Comments)    Reaction:  Unknown   . Losartan Other (See Comments)    Reaction:  Unknown   . Ondansetron Other (See Comments)    Reaction:  Unknown   . Prochlorperazine Other (See Comments)    Reaction:  Unknown   . Scopolamine Other (See Comments)    Reaction:  Unknown   . Zofran [Ondansetron Hcl] Other (See Comments)    Reaction:  hallucinations   . Oxycodone Anxiety  . Tape Rash     REVIEW OF SYSTEMS:  Review of Systems  Constitutional: Positive for malaise/fatigue. Negative for fever, chills and weight loss.  HENT: Negative for ear discharge, ear pain and nosebleeds.   Eyes: Negative for blurred vision, pain and discharge.  Respiratory: Positive for cough and shortness of breath. Negative for sputum production, wheezing and stridor.   Cardiovascular: Negative for chest pain, palpitations, orthopnea and PND.  Gastrointestinal: Negative for nausea, vomiting, abdominal pain and diarrhea.  Genitourinary: Negative for urgency and frequency.  Musculoskeletal: Negative for back pain and joint pain.  Neurological: Positive for weakness. Negative for sensory change, speech change and focal weakness.  Psychiatric/Behavioral: Negative for depression and hallucinations. The patient is not nervous/anxious.   All other systems reviewed and are negative.    MEDICATIONS AT HOME:   Prior to Admission medications   Medication Sig Start Date End Date Taking? Authorizing Provider  albuterol (PROVENTIL) (2.5 MG/3ML) 0.083% nebulizer solution Take 2.5 mg by nebulization every 4 (four) hours as needed for wheezing or shortness of breath.   Yes Historical Provider, MD  Amino Acids-Protein Hydrolys (FEEDING SUPPLEMENT, PRO-STAT SUGAR FREE 64,) LIQD Take 30 mLs by mouth 2 (two) times daily between meals. 10/05/15  Yes Fritzi Mandes, MD  aspirin EC 81 MG tablet Take 81 mg by mouth daily.   Yes Historical Provider, MD  atorvastatin (LIPITOR) 20 MG tablet Take 20 mg by mouth every evening.   Yes Historical Provider, MD  b complex-vitamin c-folic acid (NEPHRO-VITE) 0.8 MG TABS tablet Take 1 tablet by mouth daily.   Yes Historical Provider, MD  carvedilol (COREG) 3.125 MG tablet Take 8 tablets (25 mg total) by mouth 2 (two) times daily. Patient taking differently: Take 3.125 mg by mouth 2 (two) times daily.  10/05/15  Yes Fritzi Mandes, MD  cetirizine (ZYRTEC) 10 MG tablet Take 10 mg by mouth daily.    Yes Historical Provider, MD  Cholecalciferol (VITAMIN D3) 400 UNITS tablet Take 400 Units by mouth daily.   Yes Historical Provider, MD  citalopram (CELEXA) 20 MG tablet Take 20 mg by mouth daily.   Yes Historical Provider, MD  clopidogrel (PLAVIX) 75 MG tablet Take 75 mg by mouth daily.   Yes Historical Provider, MD  glucose 4 GM chewable tablet Chew 3-4 tablets by mouth as needed for low blood sugar (if pts blood sugar is less than 60).   Yes Historical Provider, MD  HYDROcodone-acetaminophen (NORCO/VICODIN) 5-325 MG per tablet Take 1 tablet by mouth 3 (three) times daily as needed for severe pain.    Yes Historical Provider, MD  hydrocortisone (ANUSOL-HC) 25 MG suppository Place 25 mg rectally 2 (two) times daily.   Yes Historical Provider, MD  lipase/protease/amylase (CREON) 12000 UNITS CPEP capsule Take 12,000 Units by mouth  3 (three) times daily with meals.    Yes Historical Provider, MD  metoCLOPramide (REGLAN) 5 MG tablet Take 1 tablet (5 mg total) by mouth 4 (four) times daily -  before meals and at bedtime. 02/24/15  Yes Srikar Sudini, MD  nitroGLYCERIN (NITROSTAT) 0.4 MG SL tablet Place 0.4 mg under the tongue every 5 (five) minutes x 3 doses as needed for chest pain. *Max 3 doses per episode*   Yes Historical Provider, MD  pregabalin (LYRICA) 25 MG capsule Take 25 mg by mouth 2 (two) times daily as needed (for leg pain).   Yes Historical Provider, MD  promethazine (PHENERGAN) 12.5 MG tablet Take 12.5 mg by mouth every 6 (six) hours as needed for nausea or vomiting.   Yes Historical Provider, MD  ranitidine (ZANTAC) 150 MG tablet Take 150 mg by mouth daily.    Yes Historical Provider, MD  sevelamer carbonate (RENVELA) 800 MG tablet Take 1,600 mg by mouth 3 (three) times daily with meals.    Yes Historical Provider, MD  vitamin B-12 (CYANOCOBALAMIN) 1000 MCG tablet Take 1,000 mcg by mouth daily.   Yes Historical Provider, MD      VITAL SIGNS:  Blood pressure 151/79, pulse 74, temperature  97.9 F (36.6 C), temperature source Axillary, resp. rate 15, height 5\' 7"  (1.702 m), weight 85.73 kg (189 lb), SpO2 98 %.  PHYSICAL EXAMINATION:  GENERAL:  55 y.o.-year-old patient lying in the bed with no acute distress. Appears chronically ill. EYES: Pupils equal, round, reactive to light and accommodation. No scleral icterus. Extraocular muscles intact. Pallor plus HEENT: Head atraumatic, normocephalic. Oropharynx and nasopharynx clear.  NECK:  Supple, no jugular venous distention. No thyroid enlargement, no tenderness.  LUNGS coarse breath sounds bilaterally, no wheezing, scattered rales upper lobes and bilateral lower lobes  -No rhonchi or crepitation. No use of accessory muscles of respiration.  CARDIOVASCULAR: S1, S2 normal. No murmurs, rubs, or gallops.  ABDOMEN: Soft, nontender, nondistended. Bowel sounds present. No organomegaly or mass.  EXTREMITIES: ++ pedal edema, no cyanosis, or clubbing.  NEUROLOGIC: Cranial nerves II through XII are intact. Muscle strength 5/5 in all extremities. Sensation intact. Gait not checked.  PSYCHIATRIC:  patient is alert and oriented x 3.  SKIN: No obvious rash, lesion, or ulcer.   LABORATORY PANEL:   CBC  Recent Labs Lab 10/07/15 0358  WBC 6.0  HGB 10.3*  HCT 31.2*  PLT 191   ------------------------------------------------------------------------------------------------------------------  Chemistries   Recent Labs Lab 10/07/15 0358  NA 134*  K 4.1  CL 93*  CO2 30  GLUCOSE 130*  BUN 17  CREATININE 4.44*  CALCIUM 8.9  MG 2.3  AST 60*  ALT 26  ALKPHOS 463*  BILITOT 1.2   ------------------------------------------------------------------------------------------------------------------  Cardiac Enzymes  Recent Labs Lab 10/07/15 0358  TROPONINI 0.03   ------------------------------------------------------------------------------------------------------------------  RADIOLOGY:  Dg Chest Portable 1 View  10/07/2015   CLINICAL DATA:  Discharged from hospital October 04, 2014. Acute onset shortness of breath. History of dialysis, diabetes, pancreatitis, hypertension. EXAM: PORTABLE CHEST 1 VIEW COMPARISON:  Chest radiograph October 03, 2015 FINDINGS: The cardiac silhouette appears mildly enlarged, mediastinal silhouette is unremarkable. Similar pulmonary vascular congestion, interstitial prominence. Small to moderate RIGHT, small LEFT pleural effusions appear slightly decreased. Patchy bibasilar airspace opacities. No pneumothorax. Multiple vascular stents. Osseous structures are nonsuspicious. IMPRESSION: Stable cardiomegaly and findings of CHF with slightly decreased small to moderate RIGHT, small LEFT pleural effusion. Bibasilar atelectasis or confluent edema, less likely pneumonia. Electronically Signed   By: Sandie Ano  Bloomer M.D.   On: 10/07/2015 04:21    EKG:  Normal sinus rhythm no acute ST-T changes  IMPRESSION AND PLAN:   [name] is a 55 y.o. female who presents to the emergency room with increasing shortness of breath started around 2:30 in the morning. Patient was found to be lethargic. She was started on CPAP by the EMS and remained in on BiPAP for a few for couple hours in the emergency room. During my evaluation she was on nasal cannula 2 L satting 99%. Patient denies any chest pain. She does feel short of breath.  1. acute on chronic respiratory failure secondary to suspected acute on chronic diastolic congestive heart failure.  -Admit to telemetry -Spoke with Dr. Candiss Norse nephrology to see if patient can be dialyzed with ultrafiltration to help with her breathing -Potassium normal 4.1 -chest x-ray shows cardiomegaly with findings of CHF with slightly decreased small to moderate right and left pleural effusion. Bibasilar atelectasis or confluent edema. -Patient's heart rate is stable. Blood pressure is 157/70. -Continue Coreg 3.125 twice a day.  2.End-stage renal disease on hemodialysis ([place]) with  possible mild volume overload. - nephrology consult for dialysis  3. history of recent  Bradycardia - junctional rhythm requiring transcutaneous pacing.  -Heart rate is 69-83.   4. history of hypertension -Continue Coreg. - Given patient's hypotension and bradycardia during last admission clonidine was discontinued. -We'll consider starting patient on hydralazine.  5.DM (diabetes mellitus) ([place])  Sugars on the lower side -during last admission and hence patient was taken off her insulin. We'll put her on sliding scale insulin now.  -Renal diet -pt advised to be OFF  Home insulin for now  6.HTN (hypertension) -on coreg. Add hydralazine  7. Coronary artery disease involving native coronary artery of native heart without angina pectoris  - continue home meds  -asa + plavix   All the records are reviewed and case discussed with ED provider. Management plans discussed with the patient, family and they are in agreement.  CODE STATUS: full  TOTAL critical TIME TAKING CARE OF THIS PATIENT: 50 minutes.    Ardath Lepak M.D on 10/07/2015 at 7:38 AM  Between 7am to 6pm - Pager - [phone]  After 6pm go to [email or web address] - password [email or web address] [facility]  Office  [phone]  CC: Primary care physician; Ellamae Sia, MD

## 2015-10-08 ENCOUNTER — Telehealth: Payer: Self-pay | Admitting: Cardiovascular Disease

## 2015-10-08 LAB — BASIC METABOLIC PANEL
Anion gap: 9 (ref 5–15)
BUN: 11 mg/dL (ref 6–20)
CO2: 28 mmol/L (ref 22–32)
Calcium: 8.4 mg/dL — ABNORMAL LOW (ref 8.9–10.3)
Chloride: 101 mmol/L (ref 101–111)
Creatinine, Ser: 3.19 mg/dL — ABNORMAL HIGH (ref 0.44–1.00)
GFR calc Af Amer: 18 mL/min — ABNORMAL LOW (ref >60)
GFR calc non Af Amer: 15 mL/min — ABNORMAL LOW (ref >60)
Glucose, Bld: 79 mg/dL (ref 65–99)
Potassium: 5.5 mmol/L — ABNORMAL HIGH (ref 3.5–5.1)
Sodium: 138 mmol/L (ref 135–145)

## 2015-10-08 LAB — CBC
HCT: 30.1 % — ABNORMAL LOW (ref 35.0–47.0)
Hemoglobin: 9.7 g/dL — ABNORMAL LOW (ref 12.0–16.0)
MCH: 26.7 pg (ref 26.0–34.0)
MCHC: 32.3 g/dL (ref 32.0–36.0)
MCV: 82.8 fL (ref 80.0–100.0)
Platelets: 165 10*3/uL (ref 150–440)
RBC: 3.64 MIL/uL — ABNORMAL LOW (ref 3.80–5.20)
RDW: 21.8 % — ABNORMAL HIGH (ref 11.5–14.5)
WBC: 6.5 10*3/uL (ref 3.6–11.0)

## 2015-10-08 LAB — GLUCOSE, CAPILLARY: Glucose-Capillary: 137 mg/dL — ABNORMAL HIGH (ref 65–99)

## 2015-10-08 MED ORDER — ALPRAZOLAM 0.25 MG PO TABS
0.2500 mg | ORAL_TABLET | Freq: Once | ORAL | Status: AC
  Administered 2015-10-08: 0.25 mg via ORAL
  Filled 2015-10-08: qty 1

## 2015-10-08 MED ORDER — HYDROCODONE-ACETAMINOPHEN 5-325 MG PO TABS
1.0000 | ORAL_TABLET | Freq: Three times a day (TID) | ORAL | Status: DC | PRN
  Administered 2015-10-09 – 2015-10-12 (×5): 1 via ORAL
  Filled 2015-10-08 (×7): qty 1

## 2015-10-08 NOTE — Progress Notes (Signed)
Initial Nutrition Assessment   INTERVENTION:   Meals and Snacks: Cater to patient preferences Medical Food Supplement Therapy: Agree with Prostat as ordered, pt was agreeable to taking last admission a few days ago and reported then receiving at outpatient dialysis. Will also send Mighty Shakes (6 oz each) once daily for added nutrition as pt does not like Nepro, and for less fluid volume than Nepro (8oz).   NUTRITION DIAGNOSIS:   Increased nutrient needs related to chronic illness as evidenced by estimated needs.  GOAL:   Patient will meet greater than or equal to 90% of their needs  MONITOR:    (Energy Intake, Electrolyte and renal Profile, Digestive system, Anthropometrics)  REASON FOR ASSESSMENT:    (Renal diet)    ASSESSMENT:   Pt dischareged 10/05/2015, readmitted 10/07/2015 with respiratory distress. Pt with h/o ESRD on HD. Pt s/p HD yesterday (net UF 4L), and out of room today, again for dialysis.   Past Medical History  Diagnosis Date  . Asthma   . Collagen vascular disease ([place])   . Diabetes mellitus without complication ([place])   . Hypertension   . Coronary artery disease     a. cath 2013: stenting to RCA (report not available); b. cath 2014: LM nl, pLAD 40%, mLAD nl, ost LCx 40%, mid LCx nl, pRCA 30% @ site of prior stent, mRCA 50%  . Diabetic neuropathy ([place])   . ESRD (end stage renal disease) on dialysis ([place])     M-W-F  . GERD (gastroesophageal reflux disease)   . Hx of pancreatitis 2015  . Myocardial infarction ([place])   . Pneumonia   . Mitral regurgitation     a. echo 10/2013: EF 62%, noWMA, mildly dilated LA, mild to mod MR/TR, GR1DD  . COPD (chronic obstructive pulmonary disease) ([place])   . chronic diastolic CHF 123XX123  . dialysis 2006  . Depression   . Anxiety   . Arthritis   . Headache   . Anemia      Diet Order:  Diet renal with fluid restriction Fluid restriction:: 1200 mL Fluid; Room service appropriate?: Yes; Fluid consistency:: Thin     Current Nutrition: Recorded po intake 60% of breakfast this am prior to dialysis.   Food/Nutrition-Related History: On last admission pt appetite fair, eating bites of meals however had reported to RD at that time her appetite had improved at home previously and pt was trying to increase protein sources. Per MST this admission, pt without decreased appetite PTA. Per MD note unsure if pt following fluid restrictions at home.   Scheduled Medications:  . aspirin EC  81 mg Oral Daily  . atorvastatin  20 mg Oral QPM  . carvedilol  3.125 mg Oral BID  . cholecalciferol  400 Units Oral Daily  . citalopram  20 mg Oral Daily  . clopidogrel  75 mg Oral Daily  . epoetin (EPOGEN/PROCRIT) injection  4,000 Units Intravenous Q M,W,F-HD  . famotidine  10 mg Oral Daily  . feeding supplement (PRO-STAT SUGAR FREE 64)  30 mL Oral BID BM  . heparin  5,000 Units Subcutaneous 3 times per day  . hydrALAZINE  25 mg Oral 3 times per day  . hydrocortisone  25 mg Rectal BID  . lipase/protease/amylase  12,000 Units Oral TID WC  . loratadine  10 mg Oral Daily  . metoCLOPramide  5 mg Oral TID AC & HS  . multivitamin  1 tablet Oral QHS  . sevelamer carbonate  1,600 mg Oral TID WC  .  sodium chloride  3 mL Intravenous Q12H  . vitamin B-12  1,000 mcg Oral Daily    Electrolyte/Renal Profile and Glucose Profile:   Recent Labs Lab 10/05/15 1051 10/07/15 0358 10/08/15 0454  NA 128* 134* 138  K 5.0 4.1 5.5*  CL 88* 93* 101  CO2 29 30 28   BUN 27* 17 11  CREATININE 5.21* 4.44* 3.19*  CALCIUM 8.0* 8.9 8.4*  MG  --  2.3  --   PHOS 5.5* 4.4  --   GLUCOSE 106* 130* 79   Protein Profile:  Recent Labs Lab 10/03/15 0116 10/05/15 1051 10/07/15 0358  ALBUMIN 3.0* 2.7* 3.1*    Gastrointestinal Profile: Last BM:   Nutrition-Focused Physical Exam Findings:  Unable to complete Nutrition-Focused physical exam at this time.    Weight Change: Per [facility] weight encounters relatively stable until recently, pt  with weight gain.    Filed Weights   10/07/15 0946 10/07/15 1245 10/07/15 1711  Weight: 183 lb 12.8 oz (83.371 kg) 186 lb 11.7 oz (84.7 kg) 179 lb (81.194 kg)  Noted weight decrease s/p dialysis yesterday   Height:   Ht Readings from Last 1 Encounters:  10/07/15 5' 5.5" (1.664 m)    Weight:   Wt Readings from Last 1 Encounters:  10/07/15 179 lb (81.194 kg)   Wt Readings from Last 10 Encounters:  10/07/15 179 lb (81.194 kg)  10/05/15 185 lb 13.6 oz (84.3 kg)  09/03/15 174 lb 2.6 oz (79 kg)  08/23/15 172 lb (78.019 kg)  07/25/15 171 lb 11.8 oz (77.9 kg)  07/20/15 174 lb (78.926 kg)  06/18/15 175 lb (79.379 kg)  06/16/15 175 lb (79.379 kg)  06/15/15 175 lb (79.379 kg)  06/14/15 175 lb (79.379 kg)   BMI:  Body mass index is 29.32 kg/(m^2).  Estimated Nutritional Needs:   Kcal:  1436-1697kcals, BEE: 1004kcals, TEE: (IF 1.1-1.3)(AF1.3)  Protein:  58-72g protein (1.2-1.5g/kg)  Fluid:  UOP+1L  EDUCATION NEEDS:   No education needs identified at this time   [place], RD, LDN Pager [phone] Weekend/On-Call Pager [phone]

## 2015-10-08 NOTE — Progress Notes (Signed)
Post hd tx 

## 2015-10-08 NOTE — Progress Notes (Signed)
[name] MRN: [id number] DOB: 05-05-1961   Cancelled Treatment:    Reason Eval/Treat Not Completed: Patient at procedure or test/unavailable  Pt at dialysis this day, will attempt PT eval tomorrow.   Esteban Kobashigawa A Akelia Husted, PT 10/08/2015, 1:33 PM

## 2015-10-08 NOTE — Progress Notes (Signed)
[facility] NAME: [name]    MR#:  [id number]  DATE OF BIRTH:  [birth year]  SUBJECTIVE:  Came in with SOB and weaknesness Underwent HD with UF of 4 liters. chronically sob  REVIEW OF SYSTEMS:   Review of Systems  Constitutional: Positive for malaise/fatigue. Negative for fever, chills and weight loss.  HENT: Negative for ear discharge, ear pain and nosebleeds.   Eyes: Negative for blurred vision, pain and discharge.  Respiratory: Positive for shortness of breath. Negative for sputum production, wheezing and stridor.   Cardiovascular: Negative for chest pain, palpitations, orthopnea and PND.  Gastrointestinal: Negative for nausea, vomiting, abdominal pain and diarrhea.  Genitourinary: Negative for urgency and frequency.  Musculoskeletal: Negative for back pain and joint pain.  Neurological: Positive for weakness. Negative for sensory change, speech change and focal weakness.  Psychiatric/Behavioral: Negative for depression and hallucinations. The patient is not nervous/anxious.   All other systems reviewed and are negative.  Tolerating Diet:yes Tolerating PT: pt has been set up with [facility]  DRUG ALLERGIES:   Allergies  Allergen Reactions  . Compazine [Prochlorperazine Edisylate] Anaphylaxis and Nausea And Vomiting  . Ace Inhibitors Swelling  . Ativan [Lorazepam] Other (See Comments)    Reaction:  Hallucinations and headaches  . Codeine Nausea And Vomiting  . Gabapentin Other (See Comments)    Reaction:  Unknown   . Losartan Other (See Comments)    Reaction:  Unknown   . Ondansetron Other (See Comments)    Reaction:  Unknown   . Prochlorperazine Other (See Comments)    Reaction:  Unknown   . Scopolamine Other (See Comments)    Reaction:  Unknown   . Zofran [Ondansetron Hcl] Other (See Comments)    Reaction:  hallucinations   . Oxycodone Anxiety  . Tape Rash    VITALS:  Blood pressure 157/66, pulse 86,  temperature 98.3 F (36.8 C), temperature source Oral, resp. rate 18, height 5' 5.5" (1.664 m), weight 81.194 kg (179 lb), SpO2 100 %.  PHYSICAL EXAMINATION:   Physical Exam  GENERAL:  55 y.o.-year-old patient lying in the bed with mild acute distress.  EYES: Pupils equal, round, reactive to light and accommodation. No scleral icterus. Extraocular muscles intact.  HEENT: Head atraumatic, normocephalic. Oropharynx and nasopharynx clear.  NECK:  Supple, no jugular venous distention. No thyroid enlargement, no tenderness.  LUNGS: distant breath sounds bilaterally, no wheezing, rales, rhonchi. No use of accessory muscles of respiration.  CARDIOVASCULAR: S1, S2 normal. No murmurs, rubs, or gallops.  ABDOMEN: Soft, nontender, nondistended. Bowel sounds present. No organomegaly or mass.  EXTREMITIES: No cyanosis, clubbing or edema b/l.    NEUROLOGIC: Cranial nerves II through XII are intact. No focal Motor or sensory deficits b/l.   PSYCHIATRIC: The patient is alert and oriented x 3.  SKIN: No obvious rash, lesion, or ulcer.   LABORATORY PANEL:  CBC  Recent Labs Lab 10/07/15 0358  WBC 6.0  HGB 10.3*  HCT 31.2*  PLT 191    Chemistries   Recent Labs Lab 10/07/15 0358 10/08/15 0454  NA 134* 138  K 4.1 5.5*  CL 93* 101  CO2 30 28  GLUCOSE 130* 79  BUN 17 11  CREATININE 4.44* 3.19*  CALCIUM 8.9 8.4*  MG 2.3  --   AST 60*  --   ALT 26  --   ALKPHOS 463*  --   BILITOT 1.2  --     Cardiac Enzymes  Recent Labs Lab 10/07/15 0358  TROPONINI 0.03   RADIOLOGY:  Dg Chest Portable 1 View  10/07/2015  CLINICAL DATA:  Discharged from hospital October 04, 2014. Acute onset shortness of breath. History of dialysis, diabetes, pancreatitis, hypertension. EXAM: PORTABLE CHEST 1 VIEW COMPARISON:  Chest radiograph October 03, 2015 FINDINGS: The cardiac silhouette appears mildly enlarged, mediastinal silhouette is unremarkable. Similar pulmonary vascular congestion, interstitial  prominence. Small to moderate RIGHT, small LEFT pleural effusions appear slightly decreased. Patchy bibasilar airspace opacities. No pneumothorax. Multiple vascular stents. Osseous structures are nonsuspicious. IMPRESSION: Stable cardiomegaly and findings of CHF with slightly decreased small to moderate RIGHT, small LEFT pleural effusion. Bibasilar atelectasis or confluent edema, less likely pneumonia. Electronically Signed   By: Elon Alas M.D.   On: 10/07/2015 04:21   ASSESSMENT AND PLAN:  [name] is a 55 y.o. female who presents to the emergency room with increasing shortness of breath started around 2:30 in the morning. Patient was found to be lethargic. She was started on CPAP by the EMS and remained in on BiPAP for a few for couple hours in the emergency room. During my evaluation she was on nasal cannula 2 L satting 99%. Patient denies any chest pain. She does feel short of breath.  1. acute on chronic respiratory failure secondary to suspected acute on chronic diastolic congestive heart failure.  -Admit to telemetry -underwent HD y'day with ultrafiltration to help with her breathing of 4 liters -Potassium normal 4.1 -chest x-ray shows cardiomegaly with findings of CHF with slightly decreased small to moderate right and left pleural effusion. Bibasilar atelectasis or confluent edema. -Patient's heart rate is stable. Blood pressure is 157/70. -Continue Coreg 3.125 twice a day. -pt will need oxygen to go home with. sats dropped in the 86%  2.End-stage renal disease on hemodialysis ([place]) with possible mild volume overload. - nephrology consult for dialysis  3. history of recent Bradycardia - junctional rhythm requiring transcutaneous pacing.  -Heart rate is79-83.   4. history of hypertension -Continue Coreg increased dose -We'll consider starting patient on hydralazine.  5.DM (diabetes mellitus) ([place])  Sugars on the lower side -during last admission and hence patient was  taken off her insulin. We'll put her on sliding scale insulin now.  -Renal diet -pt advised to be OFF Home insulin for now  6. Coronary artery disease involving native coronary artery of native heart without angina pectoris  - continue home meds  -asa + plavix  Left message for son to call Case discussed with Care Management/Social Worker. Management plans discussed with the patient, family and they are in agreement.  CODE STATUS: full  DVT Prophylaxis: heparin  TOTAL TIME TAKING CARE OF THIS PATIENT: 35 minutes.  >50% time spent on counselling and coordination of care  POSSIBLE D/C IN 1-2 DAYS, DEPENDING ON CLINICAL CONDITION.  Note: This dictation was prepared with Dragon dictation along with smaller phrase technology. Any transcriptional errors that result from this process are unintentional.  Gracy Ehly M.D on 10/08/2015 at 11:55 AM  Between 7am to 6pm - Pager - [phone]  After 6pm go to [email or web address] - password [email or web address] [facility]  Office  [phone]  CC: Primary care physician; Ellamae Sia, MD

## 2015-10-08 NOTE — Progress Notes (Signed)
SATURATION QUALIFICATIONS: (This note is used to comply with regulatory documentation for home oxygen)  Patient Saturations on Room Air at Rest = 92%  Patient Saturations on Room Air while Ambulating = 86%  Patient Saturations on 2 Liters of oxygen while Ambulating = 95%  Please briefly explain why patient needs home oxygen:

## 2015-10-08 NOTE — Care Management Important Message (Signed)
Important Message  Patient Details  Name: KAMERYN HERTZLER MRN: [id number] Date of Birth: 1961/01/18   Medicare Important Message Given:  Yes    Raseel Jans A, RN 10/08/2015, 8:11 AM

## 2015-10-08 NOTE — Care Management Note (Addendum)
Case Management Note  Patient Details  Name: [name] MRN: [id number] Date of Birth: 02/01/1961  Subjective/Objective:  Discharged from [facility] on 10/05/15 and was readmitted 10/07/15 per respiratory distress. Hx COPD. Open to [facility] for RN, PT, OT, AID, SW per [place] at [employer]. Malachy Mood will follow up with [name] this week at [facility]. Goes to Dialysis M-W-F at [facility] on [address]. Anticipate discharge home with resumption of care with [facility]. No home oxygen. PCP and Pharmacy is [facility]                 Action/Plan:   Expected Discharge Date:                  Expected Discharge Plan:     In-House Referral:     Discharge planning Services     Post Acute Care Choice:    Choice offered to:     DME Arranged:    DME Agency:     HH Arranged:    [place] Agency:     Status of Service:     Medicare Important Message Given:  Yes Date Medicare IM Given:    Medicare IM give by:    Date Additional Medicare IM Given:    Additional Medicare Important Message give by:     If discussed at [place] of Stay Meetings, dates discussed:    Additional Comments:  Rynell Ciotti A, RN 10/08/2015, 8:41 AM

## 2015-10-08 NOTE — Progress Notes (Signed)
Subjective:  Patient having some increasing shortness of breath today. Completed dialysis today.  Feeling better post dialysis.    Objective:  Vital signs in last 24 hours:  Temp:  [98.1 F (36.7 C)-99.4 F (37.4 C)] 98.8 F (37.1 C) (01/16 1659) Pulse Rate:  [78-89] 85 (01/16 1659) Resp:  [13-22] 16 (01/16 1659) BP: (153-188)/(60-89) 167/68 mmHg (01/16 1659) SpO2:  [86 %-100 %] 96 % (01/16 1659) Weight:  [76.4 kg (168 lb 6.9 oz)-81.194 kg (179 lb)] 76.4 kg (168 lb 6.9 oz) (01/16 1618)  Weight change: -2.359 kg (-5 lb 3.2 oz) Filed Weights   10/07/15 1245 10/07/15 1711 10/08/15 1618  Weight: 84.7 kg (186 lb 11.7 oz) 81.194 kg (179 lb) 76.4 kg (168 lb 6.9 oz)    Intake/Output:    Intake/Output Summary (Last 24 hours) at 10/08/15 1704 Last data filed at 10/08/15 1618  Gross per 24 hour  Intake    243 ml  Output   3000 ml  Net  -2757 ml     Physical Exam: General: NAD  HEENT Moist oral mucus membranes  Neck supple  Pulm/lungs Bibasilar rales, normal effort  CVS/Heart Regular, no rub  Abdomen:  Soft, non tender  Extremities: + generalized edema/anasarca   Neurologic:  alert, able to follow commands   Skin: No acute rashes  Access: Left arm AVF       Basic Metabolic Panel:   Recent Labs Lab 10/03/15 1147 10/04/15 0707 10/05/15 1051 10/07/15 0358 10/08/15 0454  NA 137 132* 128* 134* 138  K 4.0 4.5 5.0 4.1 5.5*  CL 95* 94* 88* 93* 101  CO2 33* 32 29 30 28   GLUCOSE 70 87 106* 130* 79  BUN 18 21* 27* 17 11  CREATININE 3.70* 4.62* 5.21* 4.44* 3.19*  CALCIUM 8.8* 8.0* 8.0* 8.9 8.4*  MG  --   --   --  2.3  --   PHOS  --   --  5.5* 4.4  --      CBC:  Recent Labs Lab 10/03/15 0116 10/03/15 0445 10/05/15 1051 10/07/15 0358 10/08/15 1405  WBC 5.7 8.8 4.0 6.0 6.5  NEUTROABS 3.8  --   --   --   --   HGB 11.1* 10.6* 10.5* 10.3* 9.7*  HCT 34.4* 32.3* 31.8* 31.2* 30.1*  MCV 83.9 85.1 83.2 82.7 82.8  PLT 202 213 201 191 165       Microbiology:  Recent Results (from the past 720 hour(s))  MRSA PCR Screening     Status: None   Collection Time: 10/03/15  4:00 AM  Result Value Ref Range Status   MRSA by PCR NEGATIVE NEGATIVE Final    Comment:        The GeneXpert MRSA Assay (FDA approved for NASAL specimens only), is one component of a comprehensive MRSA colonization surveillance program. It is not intended to diagnose MRSA infection nor to guide or monitor treatment for MRSA infections.   MRSA PCR Screening     Status: None   Collection Time: 10/07/15 10:08 AM  Result Value Ref Range Status   MRSA by PCR NEGATIVE NEGATIVE Final    Comment:        The GeneXpert MRSA Assay (FDA approved for NASAL specimens only), is one component of a comprehensive MRSA colonization surveillance program. It is not intended to diagnose MRSA infection nor to guide or monitor treatment for MRSA infections.     Coagulation Studies: No results for input(s): LABPROT, INR in the last 72  hours.  Urinalysis: No results for input(s): COLORURINE, LABSPEC, PHURINE, GLUCOSEU, HGBUR, BILIRUBINUR, KETONESUR, PROTEINUR, UROBILINOGEN, NITRITE, LEUKOCYTESUR in the last 72 hours.  Invalid input(s): APPERANCEUR    Imaging: Dg Chest Portable 1 View  10/07/2015  CLINICAL DATA:  Discharged from hospital October 04, 2014. Acute onset shortness of breath. History of dialysis, diabetes, pancreatitis, hypertension. EXAM: PORTABLE CHEST 1 VIEW COMPARISON:  Chest radiograph October 03, 2015 FINDINGS: The cardiac silhouette appears mildly enlarged, mediastinal silhouette is unremarkable. Similar pulmonary vascular congestion, interstitial prominence. Small to moderate RIGHT, small LEFT pleural effusions appear slightly decreased. Patchy bibasilar airspace opacities. No pneumothorax. Multiple vascular stents. Osseous structures are nonsuspicious. IMPRESSION: Stable cardiomegaly and findings of CHF with slightly decreased small to moderate  RIGHT, small LEFT pleural effusion. Bibasilar atelectasis or confluent edema, less likely pneumonia. Electronically Signed   By: Elon Alas M.D.   On: 10/07/2015 04:21     Medications:     . aspirin EC  81 mg Oral Daily  . atorvastatin  20 mg Oral QPM  . carvedilol  3.125 mg Oral BID  . cholecalciferol  400 Units Oral Daily  . citalopram  20 mg Oral Daily  . clopidogrel  75 mg Oral Daily  . epoetin (EPOGEN/PROCRIT) injection  4,000 Units Intravenous Q M,W,F-HD  . famotidine  10 mg Oral Daily  . feeding supplement (PRO-STAT SUGAR FREE 64)  30 mL Oral BID BM  . heparin  5,000 Units Subcutaneous 3 times per day  . hydrALAZINE  25 mg Oral 3 times per day  . hydrocortisone  25 mg Rectal BID  . lipase/protease/amylase  12,000 Units Oral TID WC  . loratadine  10 mg Oral Daily  . metoCLOPramide  5 mg Oral TID AC & HS  . multivitamin  1 tablet Oral QHS  . sevelamer carbonate  1,600 mg Oral TID WC  . sodium chloride  3 mL Intravenous Q12H  . vitamin B-12  1,000 mcg Oral Daily   sodium chloride, acetaminophen **OR** acetaminophen, albuterol, HYDROcodone-acetaminophen, pregabalin, promethazine, sodium chloride  Assessment/ Plan:  55 y.o. 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 admitted for bradycardia and hyperkalemia    [facility] MWF [place]  1. End Stage Renal Disease:  - Pt completed dialysis today, will reassess for HD tomorrow for additional volume removal.  2. Anemia of chronic kidney disease:  - continue epogen 4000 units IV with HD.   3. Secondary Hyperparathyroidism:  Phos 4.4, continue renvela 1600mg  po tid/wm.  4. Acute pulmonary edema and generalized anasarca - Improved post dialysis today, will reassess for need of HD tomorrow.    LOS: 1 Stavros Cail,  Naomie Crow 1/16/20175:04 PM

## 2015-10-08 NOTE — Progress Notes (Signed)
[name] is a 55 y.o. female  [id number]  Primary Cardiologist: Neoma Laming Reason for Consultation: CHF  HPI: [id number] presented with shortness of breath and chest pains.She was just discharged last Friday but was feeling weak at that time.  Review of Systems:NO chest pains now with PND and orthopnea.   Past Medical History  Diagnosis Date  . Asthma   . Collagen vascular disease ([place])   . Diabetes mellitus without complication ([place])   . Hypertension   . Coronary artery disease     a. cath 2013: stenting to RCA (report not available); b. cath 2014: LM nl, pLAD 40%, mLAD nl, ost LCx 40%, mid LCx nl, pRCA 30% @ site of prior stent, mRCA 50%  . Diabetic neuropathy ([place])   . ESRD (end stage renal disease) on dialysis ([place])     M-W-F  . GERD (gastroesophageal reflux disease)   . Hx of pancreatitis 2015  . Myocardial infarction ([place])   . Pneumonia   . Mitral regurgitation     a. echo 10/2013: EF 62%, noWMA, mildly dilated LA, mild to mod MR/TR, GR1DD  . COPD (chronic obstructive pulmonary disease) ([place])   . chronic diastolic CHF [id number]  . dialysis 2006  . Depression   . Anxiety   . Arthritis   . Headache   . Anemia     Medications Prior to Admission  Medication Sig Dispense Refill  . albuterol (PROVENTIL) (2.5 MG/3ML) 0.083% nebulizer solution Take 2.5 mg by nebulization every 4 (four) hours as needed for wheezing or shortness of breath.    . Amino Acids-Protein Hydrolys (FEEDING SUPPLEMENT, PRO-STAT SUGAR FREE 64,) LIQD Take 30 mLs by mouth 2 (two) times daily between meals. 900 mL 0  . aspirin EC 81 MG tablet Take 81 mg by mouth daily.    Marland Kitchen atorvastatin (LIPITOR) 20 MG tablet Take 20 mg by mouth every evening.    Marland Kitchen b complex-vitamin c-folic acid (NEPHRO-VITE) 0.8 MG TABS tablet Take 1 tablet by mouth daily.    . carvedilol (COREG) 3.125 MG tablet Take 8 tablets (25 mg total) by mouth 2 (two) times daily. (Patient taking differently: Take 3.125 mg by mouth 2 (two)  times daily. ) 60 tablet 0  . cetirizine (ZYRTEC) 10 MG tablet Take 10 mg by mouth daily.    . Cholecalciferol (VITAMIN D3) 400 UNITS tablet Take 400 Units by mouth daily.    . citalopram (CELEXA) 20 MG tablet Take 20 mg by mouth daily.    . clopidogrel (PLAVIX) 75 MG tablet Take 75 mg by mouth daily.    Marland Kitchen glucose 4 GM chewable tablet Chew 3-4 tablets by mouth as needed for low blood sugar (if pts blood sugar is less than 60).    Marland Kitchen HYDROcodone-acetaminophen (NORCO/VICODIN) 5-325 MG per tablet Take 1 tablet by mouth 3 (three) times daily as needed for severe pain.     . hydrocortisone (ANUSOL-HC) 25 MG suppository Place 25 mg rectally 2 (two) times daily.    . lipase/protease/amylase (CREON) 12000 UNITS CPEP capsule Take 12,000 Units by mouth 3 (three) times daily with meals.     . metoCLOPramide (REGLAN) 5 MG tablet Take 1 tablet (5 mg total) by mouth 4 (four) times daily -  before meals and at bedtime. 40 tablet 0  . nitroGLYCERIN (NITROSTAT) 0.4 MG SL tablet Place 0.4 mg under the tongue every 5 (five) minutes x 3 doses as needed for chest pain. *Max 3 doses per episode*    .  pregabalin (LYRICA) 25 MG capsule Take 25 mg by mouth 2 (two) times daily as needed (for leg pain).    . promethazine (PHENERGAN) 12.5 MG tablet Take 12.5 mg by mouth every 6 (six) hours as needed for nausea or vomiting.    . ranitidine (ZANTAC) 150 MG tablet Take 150 mg by mouth daily.     . sevelamer carbonate (RENVELA) 800 MG tablet Take 1,600 mg by mouth 3 (three) times daily with meals.     . vitamin B-12 (CYANOCOBALAMIN) 1000 MCG tablet Take 1,000 mcg by mouth daily.       Marland Kitchen aspirin EC  81 mg Oral Daily  . atorvastatin  20 mg Oral QPM  . carvedilol  3.125 mg Oral BID  . cholecalciferol  400 Units Oral Daily  . citalopram  20 mg Oral Daily  . clopidogrel  75 mg Oral Daily  . epoetin (EPOGEN/PROCRIT) injection  4,000 Units Intravenous Q M,W,F-HD  . famotidine  10 mg Oral Daily  . feeding supplement (PRO-STAT  SUGAR FREE 64)  30 mL Oral BID BM  . heparin  5,000 Units Subcutaneous 3 times per day  . hydrALAZINE  25 mg Oral 3 times per day  . hydrocortisone  25 mg Rectal BID  . lipase/protease/amylase  12,000 Units Oral TID WC  . loratadine  10 mg Oral Daily  . metoCLOPramide  5 mg Oral TID AC & HS  . multivitamin  1 tablet Oral QHS  . sevelamer carbonate  1,600 mg Oral TID WC  . sodium chloride  3 mL Intravenous Q12H  . vitamin B-12  1,000 mcg Oral Daily    Infusions:    Allergies  Allergen Reactions  . Compazine [Prochlorperazine Edisylate] Anaphylaxis and Nausea And Vomiting  . Ace Inhibitors Swelling  . Ativan [Lorazepam] Other (See Comments)    Reaction:  Hallucinations and headaches  . Codeine Nausea And Vomiting  . Gabapentin Other (See Comments)    Reaction:  Unknown   . Losartan Other (See Comments)    Reaction:  Unknown   . Ondansetron Other (See Comments)    Reaction:  Unknown   . Prochlorperazine Other (See Comments)    Reaction:  Unknown   . Scopolamine Other (See Comments)    Reaction:  Unknown   . Zofran [Ondansetron Hcl] Other (See Comments)    Reaction:  hallucinations   . Oxycodone Anxiety  . Tape Rash    Social History   Social History  . Marital Status: Divorced    Spouse Name: N/A  . Number of Children: N/A  . Years of Education: N/A   Occupational History  . Not on file.   Social History Main Topics  . Smoking status: Current Some Day Smoker -- 0.50 packs/day    Types: Cigarettes    Last Attempt to Quit: 02/13/2015  . Smokeless tobacco: Never Used  . Alcohol Use: No  . Drug Use: No  . Sexual Activity: Not on file   Other Topics Concern  . Not on file   Social History Narrative    Family History  Problem Relation Age of Onset  . Kidney disease Mother   . Diabetes Mother     PHYSICAL EXAM: Filed Vitals:   10/08/15 1400 10/08/15 1500  BP: 180/83 185/74  Pulse:  80  Temp:    Resp: 18 20     Intake/Output Summary (Last 24  hours) at 10/08/15 1529 Last data filed at 10/08/15 1341  Gross per 24 hour  Intake  243 ml  Output   4000 ml  Net  -3757 ml    General:  Well appearing. No respiratory difficulty HEENT: normal Neck: supple. no JVD. Carotids 2+ bilat; no bruits. No lymphadenopathy or thryomegaly appreciated. Cor: PMI nondisplaced. Regular rate & rhythm. No rubs, gallops or murmurs. Lungs: clear Abdomen: soft, nontender, nondistended. No hepatosplenomegaly. No bruits or masses. Good bowel sounds. Extremities: no cyanosis, clubbing, rash, edema Neuro: alert & oriented x 3, cranial nerves grossly intact. moves all 4 extremities w/o difficulty. Affect pleasant  Results for orders placed or performed during the hospital encounter of 10/07/15 (from the past 24 hour(s))  Glucose, capillary     Status: None   Collection Time: 10/07/15  9:27 PM  Result Value Ref Range   Glucose-Capillary 82 65 - 99 mg/dL  Basic metabolic panel     Status: Abnormal   Collection Time: 10/08/15  4:54 AM  Result Value Ref Range   Sodium 138 135 - 145 mmol/L   Potassium 5.5 (H) 3.5 - 5.1 mmol/L   Chloride 101 101 - 111 mmol/L   CO2 28 22 - 32 mmol/L   Glucose, Bld 79 65 - 99 mg/dL   BUN 11 6 - 20 mg/dL   Creatinine, Ser 3.19 (H) 0.44 - 1.00 mg/dL   Calcium 8.4 (L) 8.9 - 10.3 mg/dL   GFR calc non Af Amer 15 (L) >60 mL/min   GFR calc Af Amer 18 (L) >60 mL/min   Anion gap 9 5 - 15  CBC     Status: Abnormal   Collection Time: 10/08/15  2:05 PM  Result Value Ref Range   WBC 6.5 3.6 - 11.0 K/uL   RBC 3.64 (L) 3.80 - 5.20 MIL/uL   Hemoglobin 9.7 (L) 12.0 - 16.0 g/dL   HCT 30.1 (L) 35.0 - 47.0 %   MCV 82.8 80.0 - 100.0 fL   MCH 26.7 26.0 - 34.0 pg   MCHC 32.3 32.0 - 36.0 g/dL   RDW 21.8 (H) 11.5 - 14.5 %   Platelets 165 150 - 440 K/uL   Dg Chest Portable 1 View  10/07/2015  CLINICAL DATA:  Discharged from hospital October 04, 2014. Acute onset shortness of breath. History of dialysis, diabetes, pancreatitis,  hypertension. EXAM: PORTABLE CHEST 1 VIEW COMPARISON:  Chest radiograph October 03, 2015 FINDINGS: The cardiac silhouette appears mildly enlarged, mediastinal silhouette is unremarkable. Similar pulmonary vascular congestion, interstitial prominence. Small to moderate RIGHT, small LEFT pleural effusions appear slightly decreased. Patchy bibasilar airspace opacities. No pneumothorax. Multiple vascular stents. Osseous structures are nonsuspicious. IMPRESSION: Stable cardiomegaly and findings of CHF with slightly decreased small to moderate RIGHT, small LEFT pleural effusion. Bibasilar atelectasis or confluent edema, less likely pneumonia. Electronically Signed   By: Elon Alas M.D.   On: 10/07/2015 04:21     ASSESSMENT AND PLAN: CHF due to fluid overload, with EKG showing no acute changes. Getting dialysis currently with BP being elevated 170/80 and heart rate in 80s. KHAN,SHAUKAT A

## 2015-10-09 LAB — HEPATITIS B SURFACE ANTIGEN: Hepatitis B Surface Ag: NEGATIVE

## 2015-10-09 MED ORDER — CARVEDILOL 6.25 MG PO TABS
6.2500 mg | ORAL_TABLET | Freq: Two times a day (BID) | ORAL | Status: DC
  Administered 2015-10-09 – 2015-10-11 (×6): 6.25 mg via ORAL
  Filled 2015-10-09 (×7): qty 1

## 2015-10-09 NOTE — Progress Notes (Signed)
[name] MRN: [id number] DOB: [birth year]   Cancelled Treatment:    Reason Eval/Treat Not Completed: Patient at procedure or test/unavailable (at dialysis; will continue to attempt at a later time.)  Chart reviewed and spoke with RN about possible discharge plans today or tomorrow.   Ayris Carano A Keandria Berrocal, PT 10/09/2015, 12:04 PM

## 2015-10-09 NOTE — Progress Notes (Signed)
SUBJECTIVE: Patient is eating breakfast and is feeling less short of breath.   Filed Vitals:   10/08/15 1618 10/08/15 1659 10/08/15 1941 10/09/15 0552  BP: 170/89 167/68 147/65 146/62  Pulse: 79 85 73 71  Temp: 98.9 F (37.2 C) 98.8 F (37.1 C) 97.6 F (36.4 C) 97.9 F (36.6 C)  TempSrc: Oral Oral Oral Oral  Resp: 19 16  16   Height:      Weight: 168 lb 6.9 oz (76.4 kg)     SpO2:  96% 99% 100%    Intake/Output Summary (Last 24 hours) at 10/09/15 0855 Last data filed at 10/08/15 1618  Gross per 24 hour  Intake    240 ml  Output   3000 ml  Net  -2760 ml    LABS: Basic Metabolic Panel:  Recent Labs  10/07/15 0358 10/08/15 0454  NA 134* 138  K 4.1 5.5*  CL 93* 101  CO2 30 28  GLUCOSE 130* 79  BUN 17 11  CREATININE 4.44* 3.19*  CALCIUM 8.9 8.4*  MG 2.3  --   PHOS 4.4  --    Liver Function Tests:  Recent Labs  10/07/15 0358  AST 60*  ALT 26  ALKPHOS 463*  BILITOT 1.2  PROT 7.8  ALBUMIN 3.1*   No results for input(s): LIPASE, AMYLASE in Robin last 72 hours. CBC:  Recent Labs  10/07/15 0358 10/08/15 1405  WBC 6.0 6.5  HGB 10.3* 9.7*  HCT 31.2* 30.1*  MCV 82.7 82.8  PLT 191 165   Cardiac Enzymes:  Recent Labs  10/07/15 0358  TROPONINI 0.03   BNP: Invalid input(s): POCBNP D-Dimer: No results for input(s): DDIMER in Robin last 72 hours. Hemoglobin A1C: No results for input(s): HGBA1C in Robin last 72 hours. Fasting Lipid Panel: No results for input(s): CHOL, HDL, LDLCALC, TRIG, CHOLHDL, LDLDIRECT in Robin last 72 hours. Thyroid Function Tests: No results for input(s): TSH, T4TOTAL, T3FREE, THYROIDAB in Robin last 72 hours.  Invalid input(s): FREET3 Anemia Panel: No results for input(s): VITAMINB12, FOLATE, FERRITIN, TIBC, IRON, RETICCTPCT in Robin last 72 hours.   PHYSICAL EXAM General: Well developed, well nourished, in no acute distress HEENT:  Normocephalic and atramatic Neck:  No JVD.  Lungs: Clear bilaterally to auscultation and  percussion. Heart: HRRR . Normal S1 and S2 without gallops or murmurs.  Abdomen: Bowel sounds are positive, abdomen soft and non-tender  Msk:  Back normal, normal gait. Normal strength and tone for age. Extremities: No clubbing, cyanosis or edema.   Neuro: Alert and oriented X 3. Psych:  Good affect, responds appropriately  TELEMETRY: Sinus rhythm about 80/m  ASSESSMENT AND PLAN: Patient has ejection fraction between 35 and 40% with history of hyperkalemia leading to junctional rhythm and heart rate of 35 on last admission. All her medications were stopped at that time including Coreg and Ace inhibitors. Advise gradually starting this medication has patient has worsening heart failure.  Active Problems:   Acute respiratory failure (HCC)    Thelia Tanksley A, MD, [facility] 10/09/2015 8:55 AM

## 2015-10-09 NOTE — Progress Notes (Signed)
Pre-hd tx 

## 2015-10-09 NOTE — Progress Notes (Signed)
Subjective:  Patient has had dialysis over the past 2 days. We have decided to perform dialysis again today as she has had ongoing shortness of breath.    Objective:  Vital signs in last 24 hours:  Temp:  [97.6 F (36.4 C)-98.9 F (37.2 C)] 97.9 F (36.6 C) (01/17 0552) Pulse Rate:  [71-86] 71 (01/17 0552) Resp:  [13-22] 16 (01/17 0552) BP: (146-188)/(62-89) 146/62 mmHg (01/17 0552) SpO2:  [86 %-100 %] 100 % (01/17 0552) Weight:  [76.4 kg (168 lb 6.9 oz)] 76.4 kg (168 lb 6.9 oz) (01/16 1618)  Weight change: -6.971 kg (-15 lb 5.9 oz) Filed Weights   10/07/15 1245 10/07/15 1711 10/08/15 1618  Weight: 84.7 kg (186 lb 11.7 oz) 81.194 kg (179 lb) 76.4 kg (168 lb 6.9 oz)    Intake/Output:    Intake/Output Summary (Last 24 hours) at 10/09/15 0959 Last data filed at 10/09/15 0926  Gross per 24 hour  Intake    360 ml  Output   3000 ml  Net  -2640 ml     Physical Exam: General: NAD  HEENT Moist oral mucus membranes  Neck supple  Pulm/lungs Bibasilar rales, normal effort  CVS/Heart Regular, no rub  Abdomen:  Soft, non tender  Extremities: + generalized edema/anasarca   Neurologic:  alert, able to follow commands   Skin: No acute rashes  Access: Left arm AVF       Basic Metabolic Panel:   Recent Labs Lab 10/03/15 1147 10/04/15 0707 10/05/15 1051 10/07/15 0358 10/08/15 0454  NA 137 132* 128* 134* 138  K 4.0 4.5 5.0 4.1 5.5*  CL 95* 94* 88* 93* 101  CO2 33* 32 29 30 28   GLUCOSE 70 87 106* 130* 79  BUN 18 21* 27* 17 11  CREATININE 3.70* 4.62* 5.21* 4.44* 3.19*  CALCIUM 8.8* 8.0* 8.0* 8.9 8.4*  MG  --   --   --  2.3  --   PHOS  --   --  5.5* 4.4  --      CBC:  Recent Labs Lab 10/03/15 0116 10/03/15 0445 10/05/15 1051 10/07/15 0358 10/08/15 1405  WBC 5.7 8.8 4.0 6.0 6.5  NEUTROABS 3.8  --   --   --   --   HGB 11.1* 10.6* 10.5* 10.3* 9.7*  HCT 34.4* 32.3* 31.8* 31.2* 30.1*  MCV 83.9 85.1 83.2 82.7 82.8  PLT 202 213 201 191 165       Microbiology:  Recent Results (from the past 720 hour(s))  MRSA PCR Screening     Status: None   Collection Time: 10/03/15  4:00 AM  Result Value Ref Range Status   MRSA by PCR NEGATIVE NEGATIVE Final    Comment:        The GeneXpert MRSA Assay (FDA approved for NASAL specimens only), is one component of a comprehensive MRSA colonization surveillance program. It is not intended to diagnose MRSA infection nor to guide or monitor treatment for MRSA infections.   MRSA PCR Screening     Status: None   Collection Time: 10/07/15 10:08 AM  Result Value Ref Range Status   MRSA by PCR NEGATIVE NEGATIVE Final    Comment:        The GeneXpert MRSA Assay (FDA approved for NASAL specimens only), is one component of a comprehensive MRSA colonization surveillance program. It is not intended to diagnose MRSA infection nor to guide or monitor treatment for MRSA infections.     Coagulation Studies: No results for input(s):  LABPROT, INR in the last 72 hours.  Urinalysis: No results for input(s): COLORURINE, LABSPEC, PHURINE, GLUCOSEU, HGBUR, BILIRUBINUR, KETONESUR, PROTEINUR, UROBILINOGEN, NITRITE, LEUKOCYTESUR in the last 72 hours.  Invalid input(s): APPERANCEUR    Imaging: No results found.   Medications:     . aspirin EC  81 mg Oral Daily  . atorvastatin  20 mg Oral QPM  . carvedilol  6.25 mg Oral BID  . cholecalciferol  400 Units Oral Daily  . citalopram  20 mg Oral Daily  . clopidogrel  75 mg Oral Daily  . epoetin (EPOGEN/PROCRIT) injection  4,000 Units Intravenous Q M,W,F-HD  . famotidine  10 mg Oral Daily  . feeding supplement (PRO-STAT SUGAR FREE 64)  30 mL Oral BID BM  . heparin  5,000 Units Subcutaneous 3 times per day  . hydrALAZINE  25 mg Oral 3 times per day  . hydrocortisone  25 mg Rectal BID  . lipase/protease/amylase  12,000 Units Oral TID WC  . loratadine  10 mg Oral Daily  . metoCLOPramide  5 mg Oral TID AC & HS  . multivitamin  1 tablet Oral  QHS  . sevelamer carbonate  1,600 mg Oral TID WC  . sodium chloride  3 mL Intravenous Q12H  . vitamin B-12  1,000 mcg Oral Daily   sodium chloride, acetaminophen **OR** acetaminophen, albuterol, HYDROcodone-acetaminophen, pregabalin, promethazine, sodium chloride  Assessment/ Plan:  55 y.o. 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 admitted for bradycardia and hyperkalemia    [facility] MWF [place]  1. End Stage Renal Disease:  -  We will proceed with an additional hemodialysis treatment today. She is also due for dialysis again tomorrow if still here.  2. Anemia of chronic kidney disease:  - Hemoglobin currently 9.7, continue epogen 4000 units IV with HD.   3. Secondary Hyperparathyroidism:  Phos 4.4, continue renvela 1600mg  po tid/wm.  4. Acute pulmonary edema and generalized anasarca - Still on oxygen and continues to have bibasilar rales. We will plan for an extra hematemesis treatment today. Target ultrafiltration 1.5-2 kg.    LOS: 2 [name] 1/17/20179:59 AM

## 2015-10-09 NOTE — Care Management Note (Signed)
Patient is active at [facility] on MWF using a fistula.  Admission records have been sent to the clinic and I will send additional records at discharge.  [name]  Dialysis Liaison  [phone]

## 2015-10-09 NOTE — Progress Notes (Signed)
[place] [facility] [place] NAME: [name]    MR#:  [id number]  DATE OF BIRTH:  November 18, 1960  SUBJECTIVE:  Came in with SOB and weaknesness Underwent HD x3 treatmetn and removed about 7-8 liters of fluid chronically sob  REVIEW OF SYSTEMS:   Review of Systems  Constitutional: Positive for malaise/fatigue. Negative for fever, chills and weight loss.  HENT: Negative for ear discharge, ear pain and nosebleeds.   Eyes: Negative for blurred vision, pain and discharge.  Respiratory: Positive for shortness of breath. Negative for sputum production, wheezing and stridor.   Cardiovascular: Negative for chest pain, palpitations, orthopnea and PND.  Gastrointestinal: Negative for nausea, vomiting, abdominal pain and diarrhea.  Genitourinary: Negative for urgency and frequency.  Musculoskeletal: Negative for back pain and joint pain.  Neurological: Positive for weakness. Negative for sensory change, speech change and focal weakness.  Psychiatric/Behavioral: Negative for depression and hallucinations. The patient is not nervous/anxious.   All other systems reviewed and are negative.  Tolerating Diet:yes Tolerating PT: pt has been set up with [facility]  DRUG ALLERGIES:   Allergies  Allergen Reactions  . Compazine [Prochlorperazine Edisylate] Anaphylaxis and Nausea And Vomiting  . Ace Inhibitors Swelling  . Ativan [Lorazepam] Other (See Comments)    Reaction:  Hallucinations and headaches  . Codeine Nausea And Vomiting  . Gabapentin Other (See Comments)    Reaction:  Unknown   . Losartan Other (See Comments)    Reaction:  Unknown   . Ondansetron Other (See Comments)    Reaction:  Unknown   . Prochlorperazine Other (See Comments)    Reaction:  Unknown   . Scopolamine Other (See Comments)    Reaction:  Unknown   . Zofran [Ondansetron Hcl] Other (See Comments)    Reaction:  hallucinations   . Oxycodone Anxiety  . Tape Rash    VITALS:  Blood  pressure 146/74, pulse 71, temperature 97.7 F (36.5 C), temperature source Oral, resp. rate 18, height 5' 5.5" (1.664 m), weight 74.98 kg (165 lb 4.8 oz), SpO2 100 %.  PHYSICAL EXAMINATION:   Physical Exam  GENERAL:  55 y.o.-year-old patient lying in the bed with mild acute distress.  EYES: Pupils equal, round, reactive to light and accommodation. No scleral icterus. Extraocular muscles intact.  HEENT: Head atraumatic, normocephalic. Oropharynx and nasopharynx clear.  NECK:  Supple, no jugular venous distention. No thyroid enlargement, no tenderness.  LUNGS: distant breath sounds bilaterally, no wheezing, rales, rhonchi. No use of accessory muscles of respiration.  CARDIOVASCULAR: S1, S2 normal. No murmurs, rubs, or gallops.  ABDOMEN: Soft, nontender, nondistended. Bowel sounds present. No organomegaly or mass.  EXTREMITIES: No cyanosis, clubbing or edema b/l.    NEUROLOGIC: Cranial nerves II through XII are intact. No focal Motor or sensory deficits b/l.   PSYCHIATRIC: The patient is alert and oriented x 3.  SKIN: No obvious rash, lesion, or ulcer.   LABORATORY PANEL:  CBC  Recent Labs Lab 10/08/15 1405  WBC 6.5  HGB 9.7*  HCT 30.1*  PLT 165    Chemistries   Recent Labs Lab 10/07/15 0358 10/08/15 0454  NA 134* 138  K 4.1 5.5*  CL 93* 101  CO2 30 28  GLUCOSE 130* 79  BUN 17 11  CREATININE 4.44* 3.19*  CALCIUM 8.9 8.4*  MG 2.3  --   AST 60*  --   ALT 26  --   ALKPHOS 463*  --   BILITOT 1.2  --  Cardiac Enzymes  Recent Labs Lab 10/07/15 0358  TROPONINI 0.03   RADIOLOGY:  No results found. ASSESSMENT AND PLAN:  [name] is a 55 y.o. female who presents to the emergency room with increasing shortness of breath started around 2:30 in the morning. Patient was found to be lethargic. She was started on CPAP by the EMS and remained in on BiPAP for a few for couple hours in the emergency room. During my evaluation she was on nasal cannula 2 L satting 99%.  Patient denies any chest pain. She does feel short of breath.  1. acute on chronic respiratory failure secondary to suspected acute on chronic diastolic congestive heart failure.  -Admit to telemetry -underwent HD x 3 with UF-Potassium normal 4.1 -chest x-ray shows cardiomegaly with findings of CHF with slightly decreased small to moderate right and left pleural effusion. Bibasilar atelectasis or confluent edema. -Patient's heart rate is stable. Blood pressure is 157/70. -Continue Coreg 3.125 twice a day. -pt will need oxygen to go home with. sats dropped in the 86%  2.End-stage renal disease on hemodialysis ([place]) with possible mild volume overload. - nephrology consult for dialysis  3. history of recent Bradycardia - junctional rhythm requiring transcutaneous pacing.  -Heart rate is79-83.   4. history of hypertension -Continue Coreg increased dose -We'll consider starting patient on hydralazine.  5.DM (diabetes mellitus) ([place])  Sugars on the lower side -during last admission and hence patient was taken off her insulin. We'll put her on sliding scale insulin now.  -Renal diet -pt advised to be OFF Home insulin for now  6. Coronary artery disease involving native coronary artery of native heart without angina pectoris  - continue home meds  -asa + plavix  Left message for son to call  Case discussed with Care Management/Social Worker. Management plans discussed with the patient, family and they are in agreement.  CODE STATUS: full  DVT Prophylaxis: heparin  TOTAL TIME TAKING CARE OF THIS PATIENT: 35 minutes.  >50% time spent on counselling and coordination of care  POSSIBLE D/C IN AM DEPENDING ON CLINICAL CONDITION.  Note: This dictation was prepared with Dragon dictation along with smaller phrase technology. Any transcriptional errors that result from this process are unintentional.  Quaid Yeakle M.D on 10/09/2015 at 6:12 PM  Between 7am to 6pm - Pager -  [phone]  After 6pm go to [email or web address] - password [email or web address] [facility]  Office  [phone]  CC: Primary care physician; Ellamae Sia, MD

## 2015-10-09 NOTE — Care Management Note (Signed)
Case Management Note  Patient Details  Name: [name] MRN: [id number] Date of Birth: 12/27/1960  Subjective/Objective:    Met qualifications for home 02. Ordered from [place]. Spoke with [facility]. Patient active with SN, PT, OT,HHA  And SW. Will need resumption of care orders.                 Action/Plan:   Expected Discharge Date:                  Expected Discharge Plan:     In-House Referral:     Discharge planning Services     Post Acute Care Choice:    Choice offered to:     DME Arranged:    DME Agency:     HH Arranged:    [place] Agency:     Status of Service:     Medicare Important Message Given:  Yes Date Medicare IM Given:    Medicare IM give by:    Date Additional Medicare IM Given:    Additional Medicare Important Message give by:     If discussed at [place] of Stay Meetings, dates discussed:    Additional Comments:  Jolly Mango, RN 10/09/2015, 3:55 PM

## 2015-10-10 ENCOUNTER — Encounter: Payer: Self-pay | Admitting: Radiology

## 2015-10-10 ENCOUNTER — Inpatient Hospital Stay: Payer: Medicare Other

## 2015-10-10 MED ORDER — IOHEXOL 300 MG/ML  SOLN
75.0000 mL | Freq: Once | INTRAMUSCULAR | Status: AC | PRN
Start: 2015-10-10 — End: 2015-10-10
  Administered 2015-10-10: 75 mL via INTRAVENOUS

## 2015-10-10 NOTE — Progress Notes (Signed)
Pre HD Tx Assessment 

## 2015-10-10 NOTE — Progress Notes (Signed)
Pt has transfer orders for any med sure, no tele. Pt has been assigned to room 204. Report has been called to [employer]. Night time meds have been given, assessment charted, all questions answered. Pt said she would call her family and let them know she was moving when she got into her new room. Conley Simmonds, RN

## 2015-10-10 NOTE — Progress Notes (Signed)
A & O. No tele. 3 L of oxygen. Home health and oxygen has been set up. Thoracentesis scheduled for 1/18. Pt reported pain and received meds. Pt has no further concerns at this time.

## 2015-10-10 NOTE — Evaluation (Signed)
Physical Therapy Evaluation Patient Details Name: [name] MRN: [id number] DOB: 1960/12/18 Today's Date: 10/10/2015   History of Present Illness  Pt here with acute respiratory failure, has been admitted a few times recently for similar.  Pt reports some R side pain but otherwise states she is feeling better.  Clinical Impression  Pt is able to ambulate around the nurses' station with walker and O2.  She has slow but consistent ambulation and though she has some fatigue and drop in O2 sats she is safe and feels ready to go home.  Pt shows good effort t/o session and knows she needs to take care of herself and stay out of the hospital, she thinks O2 will help a lot.     Follow Up Recommendations Home health PT    Equipment Recommendations   (may need home O2?)    Recommendations for Other Services       Precautions / Restrictions Restrictions Weight Bearing Restrictions: No      Mobility  Bed Mobility Overal bed mobility: Independent             General bed mobility comments: Pt able to go supine to sit w/o issue  Transfers Overall transfer level: Independent Equipment used: Rolling walker (2 wheeled)             General transfer comment: Pt able to get to standing w/o assist, no LOBs and relatively good safety awareness  Ambulation/Gait Ambulation/Gait assistance: Min guard Ambulation Distance (Feet): 250 Feet Assistive device: Rolling walker (2 wheeled)       General Gait Details: Pt with slow but safe and consistent ambulation.  She was on 2 liters the entire time and her sats remained in the 90s (though she dropped from ~97% at rest to 93% by the end.    Stairs            Wheelchair Mobility    Modified Rankin (Stroke Patients Only)       Balance                                             Pertinent Vitals/Pain Pain Assessment:  (not rated, R flank/side pain at end of session)    Home Living Family/patient expects to  be discharged to:: Private residence Living Arrangements: Children Available Help at Discharge: Available PRN/intermittently;Family Type of Home: Apartment Home Access: Level entry     Home Layout: Two level;Able to live on main level with bedroom/bathroom Home Equipment: Wheelchair - [employer] - 2 wheels;Cane - single point;Bedside commode      Prior Function Level of Independence: Independent with assistive device(s)         Comments: Pt states that she is not out of the house a lot, but is able to do grocery shopping, etc if needed.      Hand Dominance        Extremity/Trunk Assessment   Upper Extremity Assessment: Overall WFL for tasks assessed           Lower Extremity Assessment: Overall WFL for tasks assessed         Communication   Communication: No difficulties  Cognition Arousal/Alertness: Awake/alert Behavior During Therapy: WFL for tasks assessed/performed Overall Cognitive Status: Within Functional Limits for tasks assessed  General Comments      Exercises        Assessment/Plan    PT Assessment Patient needs continued PT services  PT Diagnosis Difficulty walking;Generalized weakness   PT Problem List Decreased strength;Decreased activity tolerance;Decreased balance;Decreased mobility;Decreased safety awareness  PT Treatment Interventions Gait training;DME instruction;Therapeutic activities;Functional mobility training;Therapeutic exercise;Balance training;Neuromuscular re-education   PT Goals (Current goals can be found in the Care Plan section) Acute Rehab PT Goals Patient Stated Goal: "Go home" PT Goal Formulation: With patient Time For Goal Achievement: 10/24/15 Potential to Achieve Goals: Good    Frequency Min 2X/week   Barriers to discharge        Co-evaluation               End of Session Equipment Utilized During Treatment: Gait belt Activity Tolerance: Patient limited by  fatigue;Patient tolerated treatment well Patient left: with call bell/phone within reach;with chair alarm set           Time: [id number] PT Time Calculation (min) (ACUTE ONLY): 18 min   Charges:   PT Evaluation $PT Eval Low Complexity: 1 Procedure     PT G Codes:       Wayne Both, PT, DPT [phone]  Kreg Shropshire 10/10/2015, 11:19 AM

## 2015-10-10 NOTE — Progress Notes (Signed)
HD Tx Initiation 

## 2015-10-10 NOTE — Progress Notes (Signed)
[place] at [place] NAME: [name]    MR#:  [id number]  DATE OF BIRTH:  1961-08-14  SUBJECTIVE:  Came in with SOB and weaknesness Underwent HD x3 treatmetn  Son concern about patient being readmitted  REVIEW OF SYSTEMS:   Review of Systems  Constitutional: Positive for malaise/fatigue. Negative for fever, chills and weight loss.  HENT: Negative for ear discharge, ear pain and nosebleeds.   Eyes: Negative for blurred vision, pain and discharge.  Respiratory: Positive for shortness of breath. Negative for sputum production, wheezing and stridor.   Cardiovascular: Negative for chest pain, palpitations, orthopnea and PND.  Gastrointestinal: Negative for nausea, vomiting, abdominal pain and diarrhea.  Genitourinary: Negative for urgency and frequency.  Musculoskeletal: Negative for back pain and joint pain.  Neurological: Positive for weakness. Negative for sensory change, speech change and focal weakness.  Psychiatric/Behavioral: Negative for depression and hallucinations. The patient is not nervous/anxious.   All other systems reviewed and are negative.  Tolerating Diet:yes Tolerating PT: pt has been set up with [facility]  DRUG ALLERGIES:   Allergies  Allergen Reactions  . Compazine [Prochlorperazine Edisylate] Anaphylaxis and Nausea And Vomiting  . Ace Inhibitors Swelling  . Ativan [Lorazepam] Other (See Comments)    Reaction:  Hallucinations and headaches  . Codeine Nausea And Vomiting  . Gabapentin Other (See Comments)    Reaction:  Unknown   . Losartan Other (See Comments)    Reaction:  Unknown   . Ondansetron Other (See Comments)    Reaction:  Unknown   . Prochlorperazine Other (See Comments)    Reaction:  Unknown   . Scopolamine Other (See Comments)    Reaction:  Unknown   . Zofran [Ondansetron Hcl] Other (See Comments)    Reaction:  hallucinations   . Oxycodone Anxiety  . Tape Rash    VITALS:  Blood pressure  156/64, pulse 74, temperature 98.1 F (36.7 C), temperature source Oral, resp. rate 16, height 5' 5.5" (1.664 m), weight 74.98 kg (165 lb 4.8 oz), SpO2 100 %.  PHYSICAL EXAMINATION:   Physical Exam  GENERAL:  55 y.o.-year-old patient lying in the bed with mild acute distress.  EYES: Pupils equal, round, reactive to light and accommodation. No scleral icterus. Extraocular muscles intact.  HEENT: Head atraumatic, normocephalic. Oropharynx and nasopharynx clear.  NECK:  Supple, no jugular venous distention. No thyroid enlargement, no tenderness.  LUNGS: distant breath sounds bilaterally, no wheezing, rales, rhonchi. No use of accessory muscles of respiration.  CARDIOVASCULAR: S1, S2 normal. No murmurs, rubs, or gallops.  ABDOMEN: Soft, nontender, nondistended. Bowel sounds present. No organomegaly or mass.  EXTREMITIES: No cyanosis, clubbing or edema b/l.    NEUROLOGIC: Cranial nerves II through XII are intact. No focal Motor or sensory deficits b/l.   PSYCHIATRIC: The patient is alert and oriented x 3.  SKIN: No obvious rash, lesion, or ulcer.   LABORATORY PANEL:  CBC  Recent Labs Lab 10/08/15 1405  WBC 6.5  HGB 9.7*  HCT 30.1*  PLT 165    Chemistries   Recent Labs Lab 10/07/15 0358 10/08/15 0454  NA 134* 138  K 4.1 5.5*  CL 93* 101  CO2 30 28  GLUCOSE 130* 79  BUN 17 11  CREATININE 4.44* 3.19*  CALCIUM 8.9 8.4*  MG 2.3  --   AST 60*  --   ALT 26  --   ALKPHOS 463*  --   BILITOT 1.2  --  Cardiac Enzymes  Recent Labs Lab 10/07/15 0358  TROPONINI 0.03   RADIOLOGY:  Ct Chest W Contrast  10/10/2015  CLINICAL DATA:  55 year old female with shortness of breath for 1 week and abnormal chest x-ray. Chronic dialysis patient. Initial encounter. EXAM: CT CHEST WITH CONTRAST TECHNIQUE: Multidetector CT imaging of the chest was performed during intravenous contrast administration. CONTRAST:  63mL OMNIPAQUE IOHEXOL 300 MG/ML  SOLN COMPARISON:  Portable chest  radiographs 10/07/2015 and earlier. CT Abdomen and Pelvis 07/22/2015. FINDINGS: Moderate to large right and moderate left layering pleural effusions. Pleural fluid tracks into both lung apices, more so the right. Associated compressive atelectasis in both lungs, especially affecting both lower lobes. Major airways are patent. Superimposed upper lobe ground-glass opacity and septal thickening. There is more confluent peripheral and peri-bronchovascular opacity about both hila (series 3, image 20). Compared to October, both pleural effusions appear mildly increased. Cardiomegaly. No pericardial effusion. Calcified aortic and coronary artery atherosclerosis. Central pulmonary arteries are patent. Mild, reactive appearing mediastinal lymph nodes. No hilar lymphadenopathy. There is a left thoracic inlet vascular graft to the left innominate vein. Otherwise negative thoracic inlet. Mildly increased, reactive appearing axillary lymph nodes slightly greater on the right (series 2, image 11). Generalized body wall stranding suggesting body wall edema. Surgically absent gallbladder. Visualized liver, spleen, pancreas, adrenal glands, and bowel in the upper abdomen are within normal limits. Native renal atrophy and occasional hyperdense cysts noted. Increased sclerosis likely due to renal osteodystrophy. No acute osseous abnormality identified. IMPRESSION: 1. Constellation of findings most compatible with anasarca and/or heart failure. 2. Moderate to large pleural effusions greater on the right. Pulmonary interstitial edema. Generalized body wall edema. 3. Additional confluent peripheral and peri-bronchovascular pulmonary opacity at the hila, favor atelectasis over superimposed bilateral pneumonia. Electronically Signed   By: Genevie Ann M.D.   On: 10/10/2015 11:05   ASSESSMENT AND PLAN:  [name] is a 55 y.o. female who presents to the emergency room with increasing shortness of breath started around 2:30 in the morning.  Patient was found to be lethargic. She was started on CPAP by the EMS and remained in on BiPAP for a few for couple hours in the emergency room. During my evaluation she was on nasal cannula 2 L satting 99%. Patient denies any chest pain. She does feel short of breath.  1. acute on chronic respiratory failure secondary to suspected acute on chronic diastolic congestive heart failure.  Check a CT scan of the chest to evaluate flow pleural effusions if positive will need thoracentesis tomorrow    2.End-stage renal disease on hemodialysis ([place]) with possible mild volume overload. - nephrology has seen the patient aggressive diuresis  3. history of recent Bradycardia - junctional rhythm requiring transcutaneous pacing.  -Heart rate is79-83.   4. history of hypertension -Continue Coreg increased dose   5.DM (diabetes mellitus) ([place])  Sugars  stable --Renal diet -pt advised to be OFF Home insulin for now  6. Coronary artery disease involving native coronary artery of native heart without angina pectoris  - continue home meds  -asa + plavix     CODE STATUS: full  DVT Prophylaxis: heparin  TOTAL TIME TAKING CARE OF THIS PATIENT: 35 minutes.  Dustin Flock M.D on 10/10/2015 at 3:17 PM  Between 7am to 6pm - Pager - [phone]  After 6pm go to [email or web address] - password [email or web address] [facility]  Office  [phone]  CC: Primary care physician; Ellamae Sia, MD

## 2015-10-10 NOTE — Progress Notes (Signed)
Subjective:  Patient has benefited significantly from consecutive days of dialysis. Due for dialysis again today. Her breathing has significantly improved. Her weight is currently down to 74.9 kg.   Objective:  Vital signs in last 24 hours:  Temp:  [97.8 F (36.6 C)-98.1 F (36.7 C)] 98.1 F (36.7 C) (01/18 1206) Pulse Rate:  [73-74] 74 (01/18 1206) Resp:  [16-20] 16 (01/18 1206) BP: (142-156)/(52-64) 156/64 mmHg (01/18 1206) SpO2:  [97 %-100 %] 100 % (01/18 1206)  Weight change: 3.2 kg (7 lb 0.9 oz) Filed Weights   10/08/15 1618 10/09/15 1045 10/09/15 1438  Weight: 76.4 kg (168 lb 6.9 oz) 79.6 kg (175 lb 7.8 oz) 74.98 kg (165 lb 4.8 oz)    Intake/Output:    Intake/Output Summary (Last 24 hours) at 10/10/15 1445 Last data filed at 10/10/15 0830  Gross per 24 hour  Intake    360 ml  Output      0 ml  Net    360 ml     Physical Exam: General: NAD  HEENT Moist oral mucus membranes  Neck supple  Pulm/lungs Bibasilar rales, normal effort  CVS/Heart Regular, no rub  Abdomen:  Soft, non tender  Extremities: Trace LE and UE edema  Neurologic:  alert, able to follow commands   Skin: No acute rashes  Access: Left arm AVF       Basic Metabolic Panel:   Recent Labs Lab 10/04/15 0707 10/05/15 1051 10/07/15 0358 10/08/15 0454  NA 132* 128* 134* 138  K 4.5 5.0 4.1 5.5*  CL 94* 88* 93* 101  CO2 32 29 30 28   GLUCOSE 87 106* 130* 79  BUN 21* 27* 17 11  CREATININE 4.62* 5.21* 4.44* 3.19*  CALCIUM 8.0* 8.0* 8.9 8.4*  MG  --   --  2.3  --   PHOS  --  5.5* 4.4  --      CBC:  Recent Labs Lab 10/05/15 1051 10/07/15 0358 10/08/15 1405  WBC 4.0 6.0 6.5  HGB 10.5* 10.3* 9.7*  HCT 31.8* 31.2* 30.1*  MCV 83.2 82.7 82.8  PLT 201 191 165      Microbiology:  Recent Results (from the past 720 hour(s))  MRSA PCR Screening     Status: None   Collection Time: 10/03/15  4:00 AM  Result Value Ref Range Status   MRSA by PCR NEGATIVE NEGATIVE Final    Comment:         The GeneXpert MRSA Assay (FDA approved for NASAL specimens only), is one component of a comprehensive MRSA colonization surveillance program. It is not intended to diagnose MRSA infection nor to guide or monitor treatment for MRSA infections.   MRSA PCR Screening     Status: None   Collection Time: 10/07/15 10:08 AM  Result Value Ref Range Status   MRSA by PCR NEGATIVE NEGATIVE Final    Comment:        The GeneXpert MRSA Assay (FDA approved for NASAL specimens only), is one component of a comprehensive MRSA colonization surveillance program. It is not intended to diagnose MRSA infection nor to guide or monitor treatment for MRSA infections.     Coagulation Studies: No results for input(s): LABPROT, INR in the last 72 hours.  Urinalysis: No results for input(s): COLORURINE, LABSPEC, PHURINE, GLUCOSEU, HGBUR, BILIRUBINUR, KETONESUR, PROTEINUR, UROBILINOGEN, NITRITE, LEUKOCYTESUR in the last 72 hours.  Invalid input(s): APPERANCEUR    Imaging: Ct Chest W Contrast  10/10/2015  CLINICAL DATA:  55 year old female with shortness of  breath for 1 week and abnormal chest x-ray. Chronic dialysis patient. Initial encounter. EXAM: CT CHEST WITH CONTRAST TECHNIQUE: Multidetector CT imaging of the chest was performed during intravenous contrast administration. CONTRAST:  35mL OMNIPAQUE IOHEXOL 300 MG/ML  SOLN COMPARISON:  Portable chest radiographs 10/07/2015 and earlier. CT Abdomen and Pelvis 07/22/2015. FINDINGS: Moderate to large right and moderate left layering pleural effusions. Pleural fluid tracks into both lung apices, more so the right. Associated compressive atelectasis in both lungs, especially affecting both lower lobes. Major airways are patent. Superimposed upper lobe ground-glass opacity and septal thickening. There is more confluent peripheral and peri-bronchovascular opacity about both hila (series 3, image 20). Compared to October, both pleural effusions appear mildly  increased. Cardiomegaly. No pericardial effusion. Calcified aortic and coronary artery atherosclerosis. Central pulmonary arteries are patent. Mild, reactive appearing mediastinal lymph nodes. No hilar lymphadenopathy. There is a left thoracic inlet vascular graft to the left innominate vein. Otherwise negative thoracic inlet. Mildly increased, reactive appearing axillary lymph nodes slightly greater on the right (series 2, image 11). Generalized body wall stranding suggesting body wall edema. Surgically absent gallbladder. Visualized liver, spleen, pancreas, adrenal glands, and bowel in the upper abdomen are within normal limits. Native renal atrophy and occasional hyperdense cysts noted. Increased sclerosis likely due to renal osteodystrophy. No acute osseous abnormality identified. IMPRESSION: 1. Constellation of findings most compatible with anasarca and/or heart failure. 2. Moderate to large pleural effusions greater on the right. Pulmonary interstitial edema. Generalized body wall edema. 3. Additional confluent peripheral and peri-bronchovascular pulmonary opacity at the hila, favor atelectasis over superimposed bilateral pneumonia. Electronically Signed   By: Genevie Ann M.D.   On: 10/10/2015 11:05     Medications:     . aspirin EC  81 mg Oral Daily  . atorvastatin  20 mg Oral QPM  . carvedilol  6.25 mg Oral BID  . cholecalciferol  400 Units Oral Daily  . citalopram  20 mg Oral Daily  . clopidogrel  75 mg Oral Daily  . epoetin (EPOGEN/PROCRIT) injection  4,000 Units Intravenous Q M,W,F-HD  . famotidine  10 mg Oral Daily  . feeding supplement (PRO-STAT SUGAR FREE 64)  30 mL Oral BID BM  . heparin  5,000 Units Subcutaneous 3 times per day  . hydrALAZINE  25 mg Oral 3 times per day  . hydrocortisone  25 mg Rectal BID  . lipase/protease/amylase  12,000 Units Oral TID WC  . loratadine  10 mg Oral Daily  . metoCLOPramide  5 mg Oral TID AC & HS  . multivitamin  1 tablet Oral QHS  . sevelamer  carbonate  1,600 mg Oral TID WC  . sodium chloride  3 mL Intravenous Q12H  . vitamin B-12  1,000 mcg Oral Daily   sodium chloride, acetaminophen **OR** acetaminophen, albuterol, HYDROcodone-acetaminophen, pregabalin, promethazine, sodium chloride  Assessment/ Plan:  55 y.o. 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 admitted for bradycardia and hyperkalemia    [facility] MWF [facility]  1. End Stage Renal Disease:  -  atient due for dialysis today per her usual schedule. Ultrafiltration target 2 kg today..  2. Anemia of chronic kidney disease:  - continue epogen 4000 units IV with HD.   3. Secondary Hyperparathyroidism:  Phos well-controlled, continue renvela 1600mg  po tid/wm.  4. Acute pulmonary edema and generalized anasarca - patient has  significantly improved from consecutive days of dialysis.  We will plan for dialysis today.  Her lower extremity edema has improved significantly.  Her respiratory status has also improved.   LOS: 3 Jonell Krontz 1/18/20172:45 PM

## 2015-10-10 NOTE — Progress Notes (Signed)
Pre HD Tx Machine & Patient Checks 

## 2015-10-10 NOTE — Care Management Important Message (Signed)
Important Message  Patient Details  Name: [name] MRN: [id number] Date of Birth: 02/01/1961   Medicare Important Message Given:  Yes    [name] A [name] 10/10/2015, 11:26 AM

## 2015-10-10 NOTE — Care Management Note (Signed)
Spoke with patient to update her on home O2, notifications to home health agency and POC. She is agreeable and has no acute concerns at this time. Following. It is anticipated patient will discharge today.

## 2015-10-11 ENCOUNTER — Inpatient Hospital Stay: Payer: Medicare Other

## 2015-10-11 LAB — CBC
HCT: 29.9 % — ABNORMAL LOW (ref 35.0–47.0)
Hemoglobin: 9.5 g/dL — ABNORMAL LOW (ref 12.0–16.0)
MCH: 26.4 pg (ref 26.0–34.0)
MCHC: 31.7 g/dL — ABNORMAL LOW (ref 32.0–36.0)
MCV: 83.1 fL (ref 80.0–100.0)
Platelets: 198 10*3/uL (ref 150–440)
RBC: 3.6 MIL/uL — ABNORMAL LOW (ref 3.80–5.20)
RDW: 20.2 % — ABNORMAL HIGH (ref 11.5–14.5)
WBC: 5 10*3/uL (ref 3.6–11.0)

## 2015-10-11 LAB — BODY FLUID CELL COUNT WITH DIFFERENTIAL
Eos, Fluid: 1 %
Lymphs, Fluid: 55 %
Monocyte-Macrophage-Serous Fluid: 30 %
Neutrophil Count, Fluid: 14 %
Other Cells, Fluid: 0 %
Total Nucleated Cell Count, Fluid: 260 cu mm

## 2015-10-11 LAB — RENAL FUNCTION PANEL
Albumin: 2.7 g/dL — ABNORMAL LOW (ref 3.5–5.0)
Anion gap: 5 (ref 5–15)
BUN: 13 mg/dL (ref 6–20)
CO2: 34 mmol/L — ABNORMAL HIGH (ref 22–32)
Calcium: 8.4 mg/dL — ABNORMAL LOW (ref 8.9–10.3)
Chloride: 97 mmol/L — ABNORMAL LOW (ref 101–111)
Creatinine, Ser: 2.5 mg/dL — ABNORMAL HIGH (ref 0.44–1.00)
GFR calc Af Amer: 24 mL/min — ABNORMAL LOW (ref >60)
GFR calc non Af Amer: 20 mL/min — ABNORMAL LOW (ref >60)
Glucose, Bld: 84 mg/dL (ref 65–99)
Phosphorus: 2.4 mg/dL — ABNORMAL LOW (ref 2.5–4.6)
Potassium: 3.4 mmol/L — ABNORMAL LOW (ref 3.5–5.1)
Sodium: 136 mmol/L (ref 135–145)

## 2015-10-11 LAB — GLUCOSE, SEROUS FLUID: Glucose, Fluid: 130 mg/dL

## 2015-10-11 LAB — LACTATE DEHYDROGENASE, PLEURAL OR PERITONEAL FLUID: LD, Fluid: 67 U/L — ABNORMAL HIGH (ref 3–23)

## 2015-10-11 LAB — AMYLASE, BODY FLUID: Amylase, Fluid: 18 U/L

## 2015-10-11 LAB — PROTEIN, BODY FLUID: Total protein, fluid: <3 g/dL

## 2015-10-11 IMAGING — CR DG ABDOMEN ACUTE W/ 1V CHEST
1 series · 3 of 3 positions shown · non-contrast
Comparison: 05/21/2015

CLINICAL DATA: Abdominal pain, gastroparesis, nausea, vomiting,
asthma, collagen vascular disease, diabetes mellitus, hypertension,
end-stage renal disease on dialysis, COPD, chronic diastolic CHF,
smoker

EXAM:
DG ABDOMEN ACUTE W/ 1V CHEST

[Series 1: dg abd acute w/chest · 0.14mm/px · 3 of 3 slices shown]
[im 1/3]
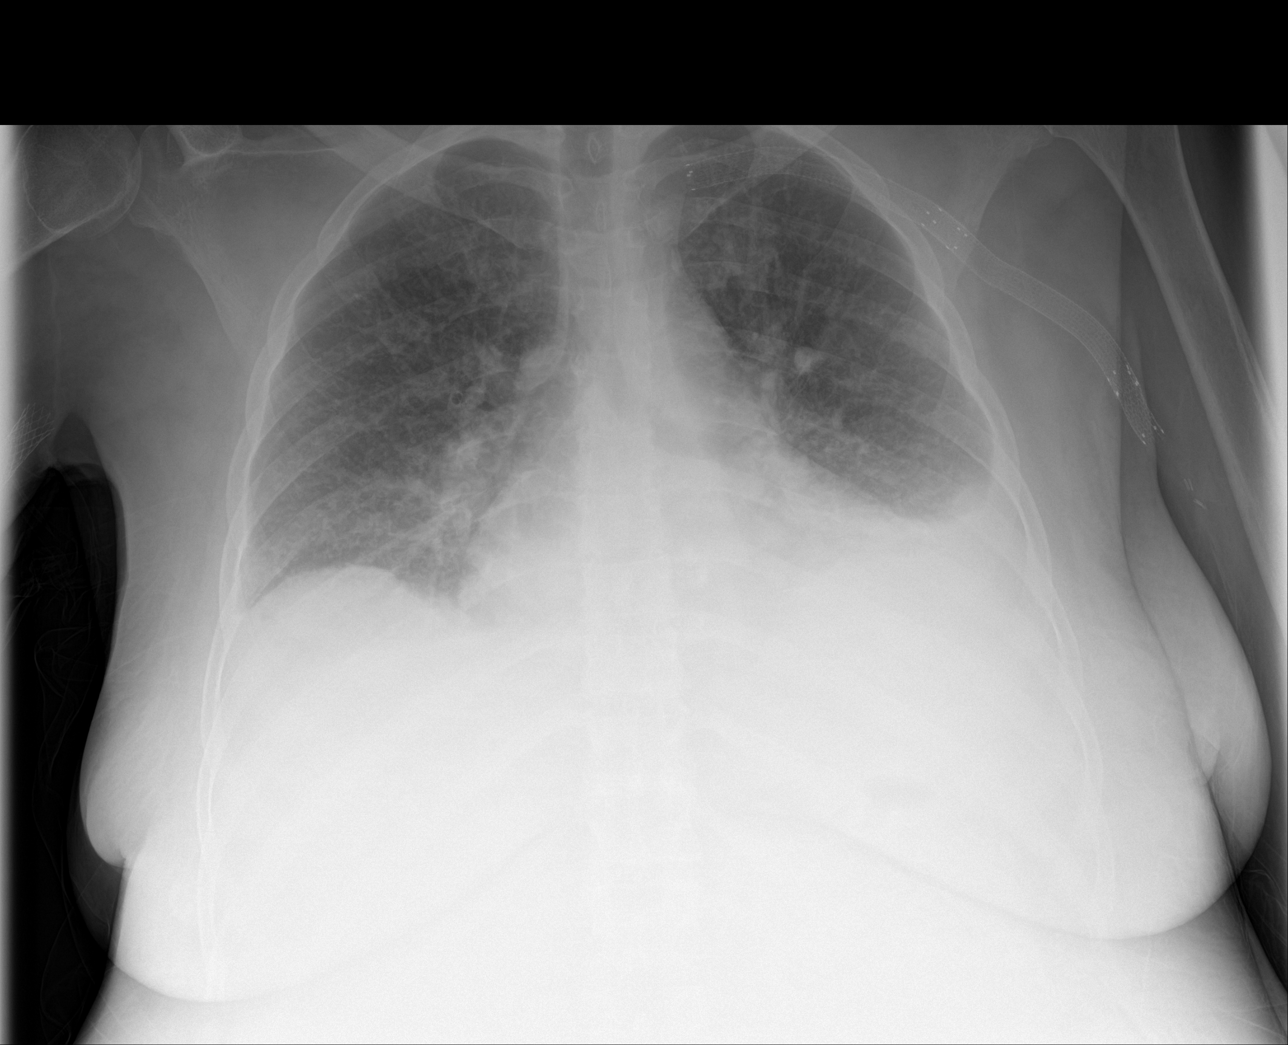
[im 2/3]
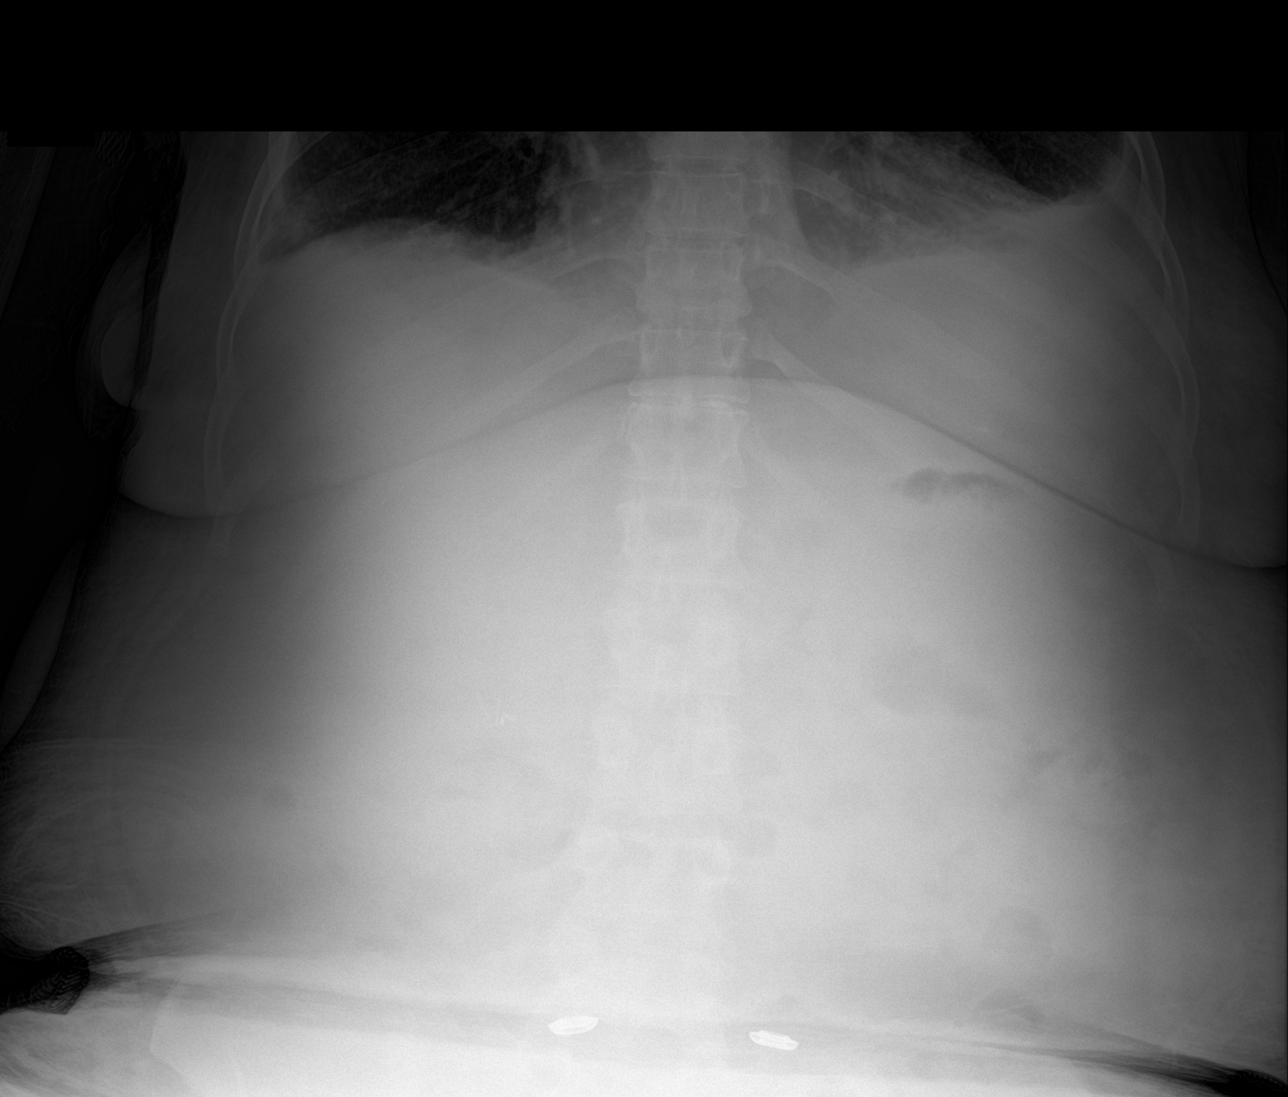
[im 3/3]
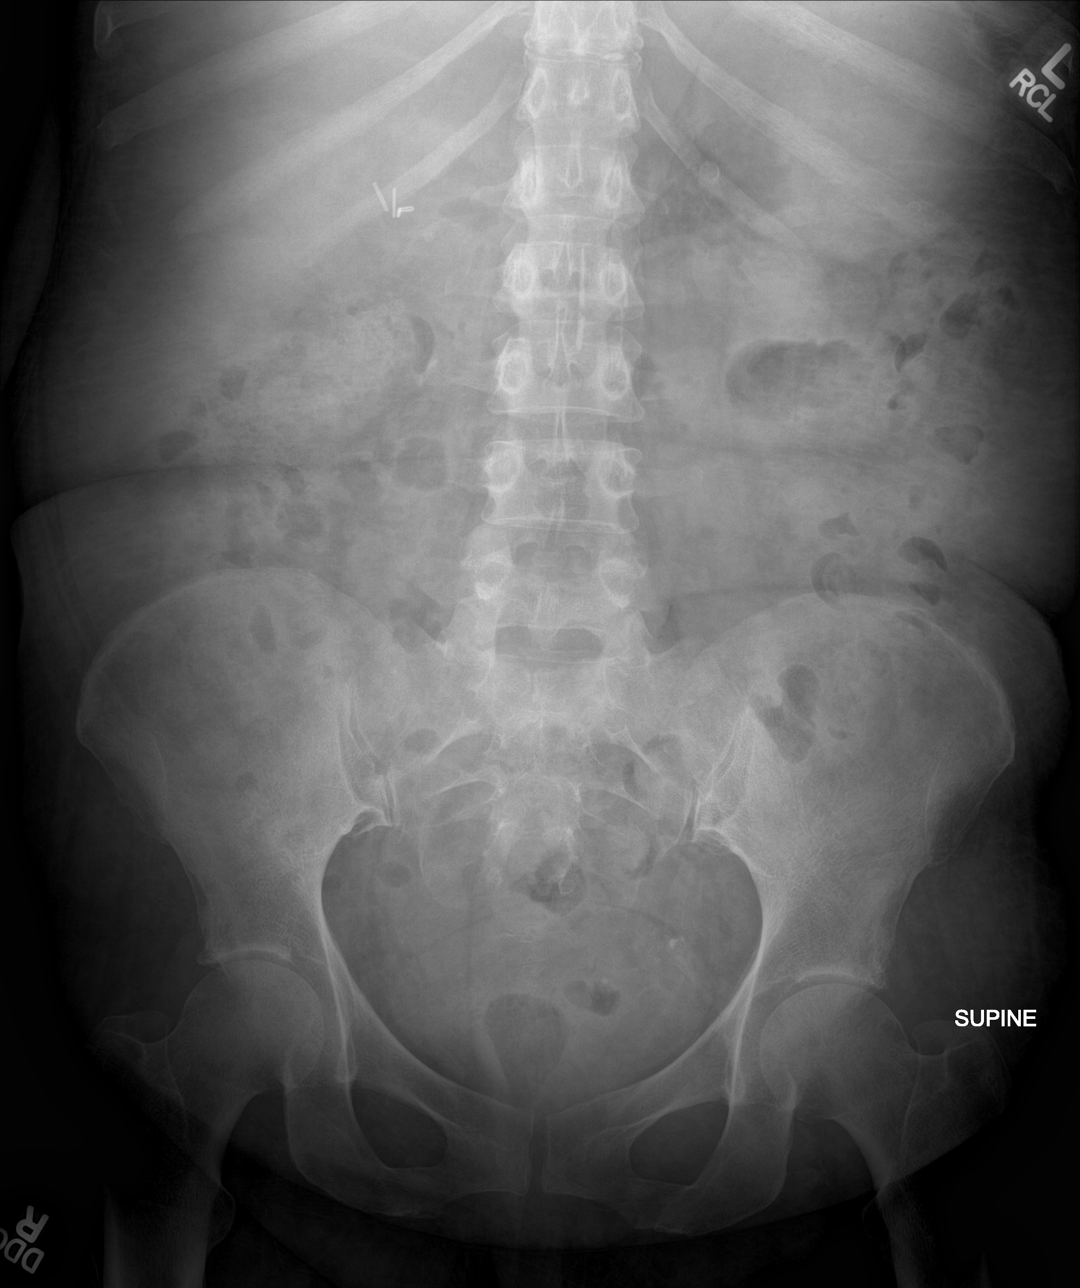

[3 of 3 positions shown; findings below may reference images not displayed]

FINDINGS: Enlargement of cardiac silhouette with pulmonary vascular
congestion.

Mediastinal contours normal.

Mild pulmonary edema with increase in LEFT basilar pleural effusion
and atelectasis.

Improved aeration at RIGHT base versus previous study.

Vascular stents identified in the LEFT axilla extending through the
LEFT subclavian region.

Minimally prominent stool in colon.

Surgical clips RIGHT upper quadrant likely cholecystectomy.

Few scattered vascular calcifications.

Bowel gas pattern otherwise normal.

No evidence of bowel obstruction, bowel wall thickening, or free
intraperitoneal air.

Diffuse osseous demineralization.
IMPRESSION: CHF with LEFT pleural effusion and basilar atelectasis.

Slightly prominent stool in colon.

Otherwise negative abdominal radiographs.

## 2015-10-11 NOTE — Progress Notes (Signed)
[place] at [place] NAME: [name]    MR#:  [id number]  DATE OF BIRTH:  03-Mar-1961  SUBJECTIVE:  Patient's breathing is improved, will be getting thoracentesis today and hemodialysis later today    REVIEW OF SYSTEMS:   Review of Systems  Constitutional: Positive for malaise/fatigue. Negative for fever, chills and weight loss.  HENT: Negative for ear discharge, ear pain and nosebleeds.   Eyes: Negative for blurred vision, pain and discharge.  Respiratory: improved shortness of breath. Negative for sputum production, wheezing and stridor.   Cardiovascular: Negative for chest pain, palpitations, orthopnea and PND.  Gastrointestinal: Negative for nausea, vomiting, abdominal pain and diarrhea.  Genitourinary: Negative for urgency and frequency.  Musculoskeletal: Negative for back pain and joint pain.  Neurological: Positive for weakness. Negative for sensory change, speech change and focal weakness.  Psychiatric/Behavioral: Negative for depression and hallucinations. The patient is not nervous/anxious.   All other systems reviewed and are negative.  Tolerating Diet:yes Tolerating PT: pt has been set up with [facility]  DRUG ALLERGIES:   Allergies  Allergen Reactions  . Compazine [Prochlorperazine Edisylate] Anaphylaxis and Nausea And Vomiting  . Ace Inhibitors Swelling  . Ativan [Lorazepam] Other (See Comments)    Reaction:  Hallucinations and headaches  . Codeine Nausea And Vomiting  . Gabapentin Other (See Comments)    Reaction:  Unknown   . Losartan Other (See Comments)    Reaction:  Unknown   . Ondansetron Other (See Comments)    Reaction:  Unknown   . Prochlorperazine Other (See Comments)    Reaction:  Unknown   . Scopolamine Other (See Comments)    Reaction:  Unknown   . Zofran [Ondansetron Hcl] Other (See Comments)    Reaction:  hallucinations   . Oxycodone Anxiety  . Tape Rash    VITALS:  Blood pressure 157/60, pulse  68, temperature 98.3 F (36.8 C), temperature source Oral, resp. rate 22, height 5' 5.5" (1.664 m), weight 70.806 kg (156 lb 1.6 oz), SpO2 100 %.  PHYSICAL EXAMINATION:   Physical Exam  GENERAL:  55 y.o.-year-old patient lying in the bed with mild acute distress.  EYES: Pupils equal, round, reactive to light and accommodation. No scleral icterus. Extraocular muscles intact.  HEENT: Head atraumatic, normocephalic. Oropharynx and nasopharynx clear.  NECK:  Supple, no jugular venous distention. No thyroid enlargement, no tenderness.  LUNGS: distant breath sounds bilaterally, no wheezing, rales, rhonchi. No use of accessory muscles of respiration.  CARDIOVASCULAR: S1, S2 normal. No murmurs, rubs, or gallops.  ABDOMEN: Soft, nontender, nondistended. Bowel sounds present. No organomegaly or mass.  EXTREMITIES: No cyanosis, clubbing or edema b/l.    NEUROLOGIC: Cranial nerves II through XII are intact. No focal Motor or sensory deficits b/l.   PSYCHIATRIC: The patient is alert and oriented x 3.  SKIN: No obvious rash, lesion, or ulcer.   LABORATORY PANEL:  CBC  Recent Labs Lab 10/11/15 0730  WBC 5.0  HGB 9.5*  HCT 29.9*  PLT 198    Chemistries   Recent Labs Lab 10/07/15 0358  10/11/15 0730  NA 134*  < > 136  K 4.1  < > 3.4*  CL 93*  < > 97*  CO2 30  < > 34*  GLUCOSE 130*  < > 84  BUN 17  < > 13  CREATININE 4.44*  < > 2.50*  CALCIUM 8.9  < > 8.4*  MG 2.3  --   --  AST 60*  --   --   ALT 26  --   --   ALKPHOS 463*  --   --   BILITOT 1.2  --   --   < > = values in this interval not displayed.  Cardiac Enzymes  Recent Labs Lab 10/07/15 0358  TROPONINI 0.03   RADIOLOGY:  Ct Chest W Contrast  10/10/2015  CLINICAL DATA:  55 year old female with shortness of breath for 1 week and abnormal chest x-ray. Chronic dialysis patient. Initial encounter. EXAM: CT CHEST WITH CONTRAST TECHNIQUE: Multidetector CT imaging of the chest was performed during intravenous contrast  administration. CONTRAST:  15mL OMNIPAQUE IOHEXOL 300 MG/ML  SOLN COMPARISON:  Portable chest radiographs 10/07/2015 and earlier. CT Abdomen and Pelvis 07/22/2015. FINDINGS: Moderate to large right and moderate left layering pleural effusions. Pleural fluid tracks into both lung apices, more so the right. Associated compressive atelectasis in both lungs, especially affecting both lower lobes. Major airways are patent. Superimposed upper lobe ground-glass opacity and septal thickening. There is more confluent peripheral and peri-bronchovascular opacity about both hila (series 3, image 20). Compared to October, both pleural effusions appear mildly increased. Cardiomegaly. No pericardial effusion. Calcified aortic and coronary artery atherosclerosis. Central pulmonary arteries are patent. Mild, reactive appearing mediastinal lymph nodes. No hilar lymphadenopathy. There is a left thoracic inlet vascular graft to the left innominate vein. Otherwise negative thoracic inlet. Mildly increased, reactive appearing axillary lymph nodes slightly greater on the right (series 2, image 11). Generalized body wall stranding suggesting body wall edema. Surgically absent gallbladder. Visualized liver, spleen, pancreas, adrenal glands, and bowel in the upper abdomen are within normal limits. Native renal atrophy and occasional hyperdense cysts noted. Increased sclerosis likely due to renal osteodystrophy. No acute osseous abnormality identified. IMPRESSION: 1. Constellation of findings most compatible with anasarca and/or heart failure. 2. Moderate to large pleural effusions greater on the right. Pulmonary interstitial edema. Generalized body wall edema. 3. Additional confluent peripheral and peri-bronchovascular pulmonary opacity at the hila, favor atelectasis over superimposed bilateral pneumonia. Electronically Signed   By: Genevie Ann M.D.   On: 10/10/2015 11:05   ASSESSMENT AND PLAN:  [name] is a 55 y.o. female who presents to  the emergency room with increasing shortness of breath started around 2:30 in the morning. Patient was found to be lethargic. She was started on CPAP by the EMS and remained in on BiPAP for a few for couple hours in the emergency room. During my evaluation she was on nasal cannula 2 L satting 99%. Patient denies any chest pain. She does feel short of breath.  1. acute on chronic respiratory failure secondary to suspected acute on chronic diastolic congestive heart failure.  Patient with pleural effusion will have thoracentesis today   2.End-stage renal disease on hemodialysis ([place]) with possible mild volume overload. - nephrology has seen the patient  continue fluid removal  3. history of recent Bradycardia - junctional rhythm requiring transcutaneous pacing.  -Heart rate is79-83.   4. history of hypertension Continue Coreg   5.DM (diabetes mellitus) ([place])  Sugars  stable --Renal diet -pt advised to be OFF Home insulin for now  6. Coronary artery disease involving native coronary artery of native heart without angina pectoris  - continue home meds  -asa + plavix     CODE STATUS: full  DVT Prophylaxis: heparin  TOTAL TIME TAKING CARE OF THIS PATIENT: 25 minutes.  Dustin Flock M.D on 10/11/2015 at 12:34 PM  Between 7am to 6pm -  Pager - [phone]  After 6pm go to [email or web address] - password [email or web address] [place] [facility]  Office  [phone]  CC: Primary care physician; Ellamae Sia, MD

## 2015-10-11 NOTE — Progress Notes (Signed)
Subjective:  Patient due for hemodialysis again today as she's had significant volume overload this admission. She is also due for thoracentesis. Overall her respiratory status has improved significantly since admission.   Objective:  Vital signs in last 24 hours:  Temp:  [97.6 F (36.4 C)-98.4 F (36.9 C)] 97.9 F (36.6 C) (01/19 1314) Pulse Rate:  [64-78] 65 (01/19 1314) Resp:  [11-22] 17 (01/19 1314) BP: (146-174)/(55-71) 152/55 mmHg (01/19 1314) SpO2:  [97 %-100 %] 100 % (01/19 1314) Weight:  [70.806 kg (156 lb 1.6 oz)-74.8 kg (164 lb 14.5 oz)] 70.806 kg (156 lb 1.6 oz) (01/18 1750)  Weight change: -4.8 kg (-10 lb 9.3 oz) Filed Weights   10/09/15 1438 10/10/15 1400 10/10/15 1750  Weight: 74.98 kg (165 lb 4.8 oz) 74.8 kg (164 lb 14.5 oz) 70.806 kg (156 lb 1.6 oz)    Intake/Output:    Intake/Output Summary (Last 24 hours) at 10/11/15 1348 Last data filed at 10/11/15 0900  Gross per 24 hour  Intake    360 ml  Output  -2500 ml  Net   2860 ml     Physical Exam: General: NAD  HEENT Moist oral mucus membranes  Neck supple  Pulm/lungs CTAB, normal effort  CVS/Heart Regular, no rub  Abdomen:  Soft, non tender  Extremities: Trace LE and UE edema  Neurologic:  alert, able to follow commands   Skin: No acute rashes  Access: Left arm AVF       Basic Metabolic Panel:   Recent Labs Lab 10/05/15 1051 10/07/15 0358 10/08/15 0454 10/11/15 0730  NA 128* 134* 138 136  K 5.0 4.1 5.5* 3.4*  CL 88* 93* 101 97*  CO2 29 30 28  34*  GLUCOSE 106* 130* 79 84  BUN 27* 17 11 13   CREATININE 5.21* 4.44* 3.19* 2.50*  CALCIUM 8.0* 8.9 8.4* 8.4*  MG  --  2.3  --   --   PHOS 5.5* 4.4  --  2.4*     CBC:  Recent Labs Lab 10/05/15 1051 10/07/15 0358 10/08/15 1405 10/11/15 0730  WBC 4.0 6.0 6.5 5.0  HGB 10.5* 10.3* 9.7* 9.5*  HCT 31.8* 31.2* 30.1* 29.9*  MCV 83.2 82.7 82.8 83.1  PLT 201 191 165 198      Microbiology:  Recent Results (from the past 720 hour(s))   MRSA PCR Screening     Status: None   Collection Time: 10/03/15  4:00 AM  Result Value Ref Range Status   MRSA by PCR NEGATIVE NEGATIVE Final    Comment:        The GeneXpert MRSA Assay (FDA approved for NASAL specimens only), is one component of a comprehensive MRSA colonization surveillance program. It is not intended to diagnose MRSA infection nor to guide or monitor treatment for MRSA infections.   MRSA PCR Screening     Status: None   Collection Time: 10/07/15 10:08 AM  Result Value Ref Range Status   MRSA by PCR NEGATIVE NEGATIVE Final    Comment:        The GeneXpert MRSA Assay (FDA approved for NASAL specimens only), is one component of a comprehensive MRSA colonization surveillance program. It is not intended to diagnose MRSA infection nor to guide or monitor treatment for MRSA infections.     Coagulation Studies: No results for input(s): LABPROT, INR in the last 72 hours.  Urinalysis: No results for input(s): COLORURINE, LABSPEC, PHURINE, GLUCOSEU, HGBUR, BILIRUBINUR, KETONESUR, PROTEINUR, UROBILINOGEN, NITRITE, LEUKOCYTESUR in the last 72 hours.  Invalid input(s): APPERANCEUR    Imaging: Ct Chest W Contrast  10/10/2015  CLINICAL DATA:  55 year old female with shortness of breath for 1 week and abnormal chest x-ray. Chronic dialysis patient. Initial encounter. EXAM: CT CHEST WITH CONTRAST TECHNIQUE: Multidetector CT imaging of the chest was performed during intravenous contrast administration. CONTRAST:  68mL OMNIPAQUE IOHEXOL 300 MG/ML  SOLN COMPARISON:  Portable chest radiographs 10/07/2015 and earlier. CT Abdomen and Pelvis 07/22/2015. FINDINGS: Moderate to large right and moderate left layering pleural effusions. Pleural fluid tracks into both lung apices, more so the right. Associated compressive atelectasis in both lungs, especially affecting both lower lobes. Major airways are patent. Superimposed upper lobe ground-glass opacity and septal thickening.  There is more confluent peripheral and peri-bronchovascular opacity about both hila (series 3, image 20). Compared to October, both pleural effusions appear mildly increased. Cardiomegaly. No pericardial effusion. Calcified aortic and coronary artery atherosclerosis. Central pulmonary arteries are patent. Mild, reactive appearing mediastinal lymph nodes. No hilar lymphadenopathy. There is a left thoracic inlet vascular graft to the left innominate vein. Otherwise negative thoracic inlet. Mildly increased, reactive appearing axillary lymph nodes slightly greater on the right (series 2, image 11). Generalized body wall stranding suggesting body wall edema. Surgically absent gallbladder. Visualized liver, spleen, pancreas, adrenal glands, and bowel in the upper abdomen are within normal limits. Native renal atrophy and occasional hyperdense cysts noted. Increased sclerosis likely due to renal osteodystrophy. No acute osseous abnormality identified. IMPRESSION: 1. Constellation of findings most compatible with anasarca and/or heart failure. 2. Moderate to large pleural effusions greater on the right. Pulmonary interstitial edema. Generalized body wall edema. 3. Additional confluent peripheral and peri-bronchovascular pulmonary opacity at the hila, favor atelectasis over superimposed bilateral pneumonia. Electronically Signed   By: Genevie Ann M.D.   On: 10/10/2015 11:05     Medications:     . aspirin EC  81 mg Oral Daily  . atorvastatin  20 mg Oral QPM  . carvedilol  6.25 mg Oral BID  . cholecalciferol  400 Units Oral Daily  . citalopram  20 mg Oral Daily  . clopidogrel  75 mg Oral Daily  . epoetin (EPOGEN/PROCRIT) injection  4,000 Units Intravenous Q M,W,F-HD  . famotidine  10 mg Oral Daily  . feeding supplement (PRO-STAT SUGAR FREE 64)  30 mL Oral BID BM  . heparin  5,000 Units Subcutaneous 3 times per day  . hydrALAZINE  25 mg Oral 3 times per day  . hydrocortisone  25 mg Rectal BID  .  lipase/protease/amylase  12,000 Units Oral TID WC  . loratadine  10 mg Oral Daily  . metoCLOPramide  5 mg Oral TID AC & HS  . multivitamin  1 tablet Oral QHS  . sevelamer carbonate  1,600 mg Oral TID WC  . sodium chloride  3 mL Intravenous Q12H  . vitamin B-12  1,000 mcg Oral Daily   sodium chloride, acetaminophen **OR** acetaminophen, albuterol, HYDROcodone-acetaminophen, pregabalin, promethazine, sodium chloride  Assessment/ Plan:  55 y.o. 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 admitted for bradycardia and hyperkalemia    [facility] MWF [place]  1. End Stage Renal Disease:  -  Extra hemodialysis scheduled for today. Again ultrafiltration target 2.5-3 kg as tolerated. She will also have dialysis tomorrow.  2. Anemia of chronic kidney disease:  - continue epogen 4000 units IV  with HD.   3. Secondary Hyperparathyroidism:  Phosphorus 2.4 at last check, continue renvela 1600mg  po tid/wm.  4. Acute pulmonary edema and generalized anasarca - Patient has had several days of consecutive dialysis. However her overall status has improved with this. Lower extremity edema and particular is improved. We will plan for additional dialysis as above today.   LOS: 4 Akeem Heppler 1/19/20171:48 PM

## 2015-10-11 NOTE — Progress Notes (Signed)
SUBJECTIVE: Patient is feeling much better   Filed Vitals:   10/10/15 1750 10/10/15 1956 10/10/15 2247 10/11/15 0536  BP: 174/68 163/66 155/61 154/57  Pulse: 78 77 67 64  Temp:  97.9 F (36.6 C) 97.6 F (36.4 C) 98.3 F (36.8 C)  TempSrc:  Oral Oral Oral  Resp:  18 18 18   Height:      Weight: 156 lb 1.6 oz (70.806 kg)     SpO2: 97% 98% 100% 100%    Intake/Output Summary (Last 24 hours) at 10/11/15 0918 Last data filed at 10/11/15 0720  Gross per 24 hour  Intake      0 ml  Output  -2500 ml  Net   2500 ml    LABS: Basic Metabolic Panel:  Recent Labs  10/11/15 0730  NA 136  K 3.4*  CL 97*  CO2 34*  GLUCOSE 84  BUN 13  CREATININE 2.50*  CALCIUM 8.4*  PHOS 2.4*   Liver Function Tests:  Recent Labs  10/11/15 0730  ALBUMIN 2.7*   No results for input(s): LIPASE, AMYLASE in the last 72 hours. CBC:  Recent Labs  10/08/15 1405 10/11/15 0730  WBC 6.5 5.0  HGB 9.7* 9.5*  HCT 30.1* 29.9*  MCV 82.8 83.1  PLT 165 198   Cardiac Enzymes: No results for input(s): CKTOTAL, CKMB, CKMBINDEX, TROPONINI in the last 72 hours. BNP: Invalid input(s): POCBNP D-Dimer: No results for input(s): DDIMER in the last 72 hours. Hemoglobin A1C: No results for input(s): HGBA1C in the last 72 hours. Fasting Lipid Panel: No results for input(s): CHOL, HDL, LDLCALC, TRIG, CHOLHDL, LDLDIRECT in the last 72 hours. Thyroid Function Tests: No results for input(s): TSH, T4TOTAL, T3FREE, THYROIDAB in the last 72 hours.  Invalid input(s): FREET3 Anemia Panel: No results for input(s): VITAMINB12, FOLATE, FERRITIN, TIBC, IRON, RETICCTPCT in the last 72 hours.   PHYSICAL EXAM General: Well developed, well nourished, in no acute distress HEENT:  Normocephalic and atramatic Neck:  No JVD.  Lungs: Clear bilaterally to auscultation and percussion. Heart: HRRR . Normal S1 and S2 without gallops or murmurs.  Abdomen: Bowel sounds are positive, abdomen soft and non-tender  Msk:  Back  normal, normal gait. Normal strength and tone for age. Extremities: No clubbing, cyanosis or edema.   Neuro: Alert and oriented X 3. Psych:  Good affect, responds appropriately  TELEMETRY: Sinus rhythm  ASSESSMENT AND PLAN: CHF with moderate to severe LV dysfunction status post hyperkalemia and bradycardia. Slowly will go up on her medications especially Corag since his cause bradycardia.  Active Problems:   Acute respiratory failure (HCC)    Eliab Closson A, MD, [facility] 10/11/2015 9:18 AM

## 2015-10-11 NOTE — Progress Notes (Signed)
Physical Therapy Treatment Patient Details Name: [name] MRN: [id number] DOB: 1961/06/10 Today's Date: October 19, 2015    History of Present Illness Pt here with acute respiratory failure, has been admitted a few times recently for similar.  Pt reports some R side pain but otherwise states she is feeling better.    PT Comments    Pt with fair demonstration of mobility this session, as was limited by pain in R flank/ribs.  Pt was I with bed mobility and transfers and demonstrated controlled movements to manage pain.  Nsg aware of pain status at time of session.  Pt ambulated approx 253ft with RW with no LOB and slow cadence.    Follow Up Recommendations  Home health PT     Equipment Recommendations       Recommendations for Other Services       Precautions / Restrictions Restrictions Weight Bearing Restrictions: No    Mobility  Bed Mobility Overal bed mobility: Independent                Transfers Overall transfer level: Independent Equipment used: Rolling walker (2 wheeled)             General transfer comment: slow controlled sit to stand due to current level of pain   Ambulation/Gait Ambulation/Gait assistance: Min guard Ambulation Distance (Feet): 250 Feet Assistive device: Rolling walker (2 wheeled)       General Gait Details: Pt initially demonstrated unsteady gait however improved with ambulation    Stairs            Wheelchair Mobility    Modified Rankin (Stroke Patients Only)       Balance                                    Cognition Arousal/Alertness: Awake/alert Behavior During Therapy: WFL for tasks assessed/performed Overall Cognitive Status: Within Functional Limits for tasks assessed                      Exercises      General Comments        Pertinent Vitals/Pain Pain Assessment: 0-10 Pain Score: 9  Pain Location: R flank/ribs Pain Intervention(s): Limited activity within patient's  tolerance;Patient requesting pain meds-RN notified    Home Living                      Prior Function            PT Goals (current goals can now be found in the care plan section) Acute Rehab PT Goals Patient Stated Goal: "Go home" PT Goal Formulation: With patient Time For Goal Achievement: 10/24/15 Potential to Achieve Goals: Good    Frequency  Min 2X/week    PT Plan      Co-evaluation             End of Session Equipment Utilized During Treatment: Gait belt;Oxygen Activity Tolerance: Patient tolerated treatment well;Patient limited by pain Patient left: Other (comment) (transport arrived to take pt to HD )     Time: 1340-1355 PT Time Calculation (min) (ACUTE ONLY): 15 min  Charges:  $Gait Training: 8-22 mins                    G Codes:      [name] Oct 19, 2015, 2:03 PM  [name], PTA

## 2015-10-12 LAB — CBC
HCT: 30.2 % — ABNORMAL LOW (ref 35.0–47.0)
Hemoglobin: 9.5 g/dL — ABNORMAL LOW (ref 12.0–16.0)
MCH: 26.4 pg (ref 26.0–34.0)
MCHC: 31.5 g/dL — ABNORMAL LOW (ref 32.0–36.0)
MCV: 83.8 fL (ref 80.0–100.0)
Platelets: 168 10*3/uL (ref 150–440)
RBC: 3.6 MIL/uL — ABNORMAL LOW (ref 3.80–5.20)
RDW: 20.6 % — ABNORMAL HIGH (ref 11.5–14.5)
WBC: 5.5 10*3/uL (ref 3.6–11.0)

## 2015-10-12 LAB — RENAL FUNCTION PANEL
Albumin: 2.4 g/dL — ABNORMAL LOW (ref 3.5–5.0)
Anion gap: 7 (ref 5–15)
BUN: 15 mg/dL (ref 6–20)
CO2: 33 mmol/L — ABNORMAL HIGH (ref 22–32)
Calcium: 8.4 mg/dL — ABNORMAL LOW (ref 8.9–10.3)
Chloride: 97 mmol/L — ABNORMAL LOW (ref 101–111)
Creatinine, Ser: 2.47 mg/dL — ABNORMAL HIGH (ref 0.44–1.00)
GFR calc Af Amer: 24 mL/min — ABNORMAL LOW (ref >60)
GFR calc non Af Amer: 21 mL/min — ABNORMAL LOW (ref >60)
Glucose, Bld: 80 mg/dL (ref 65–99)
Phosphorus: 2.4 mg/dL — ABNORMAL LOW (ref 2.5–4.6)
Potassium: 3.5 mmol/L (ref 3.5–5.1)
Sodium: 137 mmol/L (ref 135–145)

## 2015-10-12 LAB — TRIGLYCERIDES, BODY FLUIDS: Triglycerides, Fluid: 16 mg/dL

## 2015-10-12 MED ORDER — CARVEDILOL 12.5 MG PO TABS: 25.0000 mg | ORAL_TABLET | Freq: Two times a day (BID) | ORAL | Status: DC

## 2015-10-12 NOTE — Care Management Important Message (Signed)
Important Message  Patient Details  Name: [name] MRN: [id number] Date of Birth: 1961/03/18   Medicare Important Message Given:  Yes    [name] A Kyre Jeffries 10/12/2015, 9:39 AM

## 2015-10-12 NOTE — Discharge Summary (Signed)
[name], 55 y.o., DOB [birth year], MRN [id number]. Admission date: 10/07/2015 Discharge Date 10/12/2015 Primary MD Robin Sia, MD Admitting Physician Robin Mandes, MD  Admission Diagnosis  Acute pulmonary edema ([place]) [J81.0] Acute respiratory failure, unspecified whether with hypoxia or hypercapnia ([place]) [J96.00]  Discharge Diagnosis   Active Problems:   Acute respiratory failure ([place]) due to fluid overload  ESRD HTN DM CAD Pleural effusion status post thoracentesis Asthma Diabetes GERD History of MI Mitral regurg COPD Anxiety Anemia of chronic disease  Hospital Course  [name] is a 55 y.o. female with a known history of incisional disease on hemodialysis, COPD, CAD, chronic diastolic congestive heart failure, GERD comes to the emergency room after she woke up around 2:30 with increasing shortness of breath. Patient had recently been hospitalized with same presentation. In the ER she had to be placed on BiPAP. She was admitted for acute fluid overload. She was seen by nephrology and dialyzed on daily basis. She has significant amount of fluid removed. Patient also has chronic pleural effusions due to her recurrent symptoms she underwent right-sided thoracentesis with fluid removal. Patient's breathing is much improved she is on chronic oxygen therapy which will be continued at home.             Consults  nephrology  Significant Tests:  See full reports for all details      Dg Chest 1 View  10/11/2015  CLINICAL DATA:  Pleural effusions, post right thoracentesis EXAM: CHEST  1 VIEW COMPARISON:  10/07/2015 FINDINGS: Near complete resolution of the visualized right pleural effusion. No pneumothorax. Persistent moderate left pleural effusion. Persisting consolidation/atelectasis at the left lung base with air bronchograms. Improved aeration of the right lung base. Mild diffuse interstitial edema and central pulmonary vascular congestion. Heart size upper limits normal for  technique. Bilateral brachial and left subclavian vascular stents are noted. New IMPRESSION: 1. No pneumothorax post right thoracentesis, with no residual effusion evident. 2. Persistent moderate left pleural effusion with adjacent consolidation/atelectasis. 3. Stable interstitial edema. Electronically Signed   By: [name] M.D.   On: 10/11/2015 14:18   Ct Chest W Contrast  10/10/2015  CLINICAL DATA:  55 year old female with shortness of breath for 1 week and abnormal chest x-ray. Chronic dialysis patient. Initial encounter. EXAM: CT CHEST WITH CONTRAST TECHNIQUE: Multidetector CT imaging of the chest was performed during intravenous contrast administration. CONTRAST:  68mL OMNIPAQUE IOHEXOL 300 MG/ML  SOLN COMPARISON:  Portable chest radiographs 10/07/2015 and earlier. CT Abdomen and Pelvis 07/22/2015. FINDINGS: Moderate to large right and moderate left layering pleural effusions. Pleural fluid tracks into both lung apices, more so the right. Associated compressive atelectasis in both lungs, especially affecting both lower lobes. Major airways are patent. Superimposed upper lobe ground-glass opacity and septal thickening. There is more confluent peripheral and peri-bronchovascular opacity about both hila (series 3, image 20). Compared to October, both pleural effusions appear mildly increased. Cardiomegaly. No pericardial effusion. Calcified aortic and coronary artery atherosclerosis. Central pulmonary arteries are patent. Mild, reactive appearing mediastinal lymph nodes. No hilar lymphadenopathy. There is a left thoracic inlet vascular graft to the left innominate vein. Otherwise negative thoracic inlet. Mildly increased, reactive appearing axillary lymph nodes slightly greater on the right (series 2, image 11). Generalized body wall stranding suggesting body wall edema. Surgically absent gallbladder. Visualized liver, spleen, pancreas, adrenal glands, and bowel in the upper abdomen are within normal limits.  Native renal atrophy and occasional hyperdense cysts noted. Increased sclerosis likely due to renal osteodystrophy.  No acute osseous abnormality identified. IMPRESSION: 1. Constellation of findings most compatible with anasarca and/or heart failure. 2. Moderate to large pleural effusions greater on the right. Pulmonary interstitial edema. Generalized body wall edema. 3. Additional confluent peripheral and peri-bronchovascular pulmonary opacity at the hila, favor atelectasis over superimposed bilateral pneumonia. Electronically Signed   By: [name] M.D.   On: 10/10/2015 11:05   Dg Chest Portable 1 View  10/07/2015  CLINICAL DATA:  Discharged from hospital October 04, 2014. Acute onset shortness of breath. History of dialysis, diabetes, pancreatitis, hypertension. EXAM: PORTABLE CHEST 1 VIEW COMPARISON:  Chest radiograph October 03, 2015 FINDINGS: The cardiac silhouette appears mildly enlarged, mediastinal silhouette is unremarkable. Similar pulmonary vascular congestion, interstitial prominence. Small to moderate RIGHT, small LEFT pleural effusions appear slightly decreased. Patchy bibasilar airspace opacities. No pneumothorax. Multiple vascular stents. Osseous structures are nonsuspicious. IMPRESSION: Stable cardiomegaly and findings of CHF with slightly decreased small to moderate RIGHT, small LEFT pleural effusion. Bibasilar atelectasis or confluent edema, less likely pneumonia. Electronically Signed   By: [name] M.D.   On: 10/07/2015 04:21   Dg Chest Port 1 View  10/03/2015  CLINICAL DATA:  55 year old female with respiratory distress. EXAM: PORTABLE CHEST 1 VIEW COMPARISON:  Radiograph dated 09/01/2015 FINDINGS: Two views of the chest demonstrate bilateral small to moderate pleural effusions with associated partial compressive atelectasis of the lower lobes. Pneumonia is not excluded. Stable cardiomegaly with central vascular prominence likely congestive changes. No pneumothorax. Left subclavian  and axillary vascular stent is again noted. IMPRESSION: Congestive changes with small to moderate bilateral pleural effusions. Superimposed pneumonia is not excluded. Clinical correlation and follow-up recommended. Electronically Signed   By: Anner Crete M.D.   On: 10/03/2015 01:49   US Thoracentesis Asp Pleural Space W/img Guide  10/11/2015  CLINICAL DATA:  Bilateral pleural effusions. Shortness of breath. Tolerated previous thoracentesis 02/21/2015. EXAM: EXAM THORACENTESIS WITH ULTRASOUND TECHNIQUE: The procedure, risks (including but not limited to bleeding, infection, organ damage ), benefits, and alternatives were explained to the patient. Questions regarding the procedure were encouraged and answered. The patient understands and consents to the procedure. Survey ultrasound of the right hemithorax was performed and an appropriate skin entry site was localized. Site was marked, prepped with Betadine, draped in usual sterile fashion, infiltrated locally with 1% lidocaine. The Saf-T-Centesis needle was advanced into the pleural space. Dark amber fluid returned. 1 L was removed. Post procedure ultrasound shows no residual fluid. The patient tolerated procedure well. COMPLICATIONS: COMPLICATIONS None immediate IMPRESSION: Technically successful ultrasound-guided right thoracentesis. Follow-up chest radiograph pending. Electronically Signed   By: [name] M.D.   On: 10/11/2015 14:16       Today   Subjective:   [name]   C/o pain at site of thoracetnsis, breathing improved  Objective:   Blood pressure 148/51, pulse 65, temperature 97.7 F (36.5 C), temperature source Oral, resp. rate 18, height 5' 5.5" (1.664 m), weight 71.1 kg (156 lb 12 oz), SpO2 100 %.  .  Intake/Output Summary (Last 24 hours) at 10/12/15 1517 Last data filed at 10/12/15 1330  Gross per 24 hour  Intake      0 ml  Output   4500 ml  Net  -4500 ml    Exam VITAL SIGNS: Blood pressure 148/51, pulse 65, temperature  97.7 F (36.5 C), temperature source Oral, resp. rate 18, height 5' 5.5" (1.664 m), weight 71.1 kg (156 lb 12 oz), SpO2 100 %.  GENERAL:  56 y.o.-year-old patient lying  in the bed with no acute distress.  EYES: Pupils equal, round, reactive to light and accommodation. No scleral icterus. Extraocular muscles intact.  HEENT: Head atraumatic, normocephalic. Oropharynx and nasopharynx clear.  NECK:  Supple, no jugular venous distention. No thyroid enlargement, no tenderness.  LUNGS: Normal breath sounds bilaterally, no wheezing, rales,rhonchi or crepitation. No use of accessory muscles of respiration.  CARDIOVASCULAR: S1, S2 normal. No murmurs, rubs, or gallops.  ABDOMEN: Soft, nontender, nondistended. Bowel sounds present. No organomegaly or mass.  EXTREMITIES: No pedal edema, cyanosis, or clubbing.  NEUROLOGIC: Cranial nerves II through XII are intact. Muscle strength 5/5 in all extremities. Sensation intact. Gait not checked.  PSYCHIATRIC: The patient is alert and oriented x 3.  SKIN: No obvious rash, lesion, or ulcer.   Data Review     CBC w Diff: Lab Results  Component Value Date   WBC 5.5 10/12/2015   WBC 10.0 09/15/2014   HGB 9.5* 10/12/2015   HGB 10.5* 09/15/2014   HCT 30.2* 10/12/2015   HCT 34.2* 09/15/2014   PLT 168 10/12/2015   PLT 203 09/15/2014   LYMPHOPCT 19 10/03/2015   LYMPHOPCT 51.6 05/31/2014   BANDSPCT 1 10/03/2015   MONOPCT 9 10/03/2015   MONOPCT 9.6 05/31/2014   EOSPCT 6 10/03/2015   EOSPCT 4.6 05/31/2014   BASOPCT 0 10/03/2015   BASOPCT 0.4 05/31/2014   CMP: Lab Results  Component Value Date   NA 137 10/12/2015   NA 135* 09/15/2014   K 3.5 10/12/2015   K 4.8 09/15/2014   CL 97* 10/12/2015   CL 99 09/15/2014   CO2 33* 10/12/2015   CO2 26 09/15/2014   BUN 15 10/12/2015   BUN 19* 09/15/2014   CREATININE 2.47* 10/12/2015   CREATININE 6.79* 09/15/2014   PROT 7.8 10/07/2015   PROT 7.2 09/15/2014   ALBUMIN 2.4* 10/12/2015   ALBUMIN 3.0* 09/15/2014    BILITOT 1.2 10/07/2015   BILITOT 0.5 09/15/2014   ALKPHOS 463* 10/07/2015   ALKPHOS 105 09/15/2014   AST 60* 10/07/2015   AST 43* 09/15/2014   ALT 26 10/07/2015   ALT 16 09/15/2014  .  Micro Results Recent Results (from the past 240 hour(s))  MRSA PCR Screening     Status: None   Collection Time: 10/03/15  4:00 AM  Result Value Ref Range Status   MRSA by PCR NEGATIVE NEGATIVE Final    Comment:        The GeneXpert MRSA Assay (FDA approved for NASAL specimens only), is one component of a comprehensive MRSA colonization surveillance program. It is not intended to diagnose MRSA infection nor to guide or monitor treatment for MRSA infections.   MRSA PCR Screening     Status: None   Collection Time: 10/07/15 10:08 AM  Result Value Ref Range Status   MRSA by PCR NEGATIVE NEGATIVE Final    Comment:        The GeneXpert MRSA Assay (FDA approved for NASAL specimens only), is one component of a comprehensive MRSA colonization surveillance program. It is not intended to diagnose MRSA infection nor to guide or monitor treatment for MRSA infections.   Body fluid culture     Status: None (Preliminary result)   Collection Time: 10/11/15 12:13 PM  Result Value Ref Range Status   Specimen Description PLEURAL  Final   Special Requests NONE  Final   Gram Stain PENDING  Incomplete   Culture NO GROWTH < 24 HOURS  Final   Report Status PENDING  Incomplete  Code Status Orders        Start     Ordered   10/07/15 0938  Full code   Continuous     10/07/15 0937    Code Status History    Date Active Date Inactive Code Status Order ID Comments User Context   10/03/2015  3:55 AM 10/05/2015 10:30 PM Full Code [id number]  Lance Coon, MD Inpatient   08/31/2015  4:00 PM 09/03/2015 10:09 PM Full Code [id number]  Demetrios Loll, MD ED   07/26/2015  3:28 PM 07/26/2015  9:36 PM Full Code [id number]  Dionisio David, MD Inpatient   07/22/2015  5:36 PM 07/26/2015  3:28 PM Full Code [id number]   Vaughan Basta, MD Inpatient   06/05/2015  8:39 PM 06/07/2015  8:03 PM Full Code [id number]  Bettey Costa, MD Inpatient   05/21/2015  9:42 PM 05/24/2015  4:26 PM Full Code [id number]  Henreitta Leber, MD Inpatient   04/24/2015  8:55 AM 04/26/2015 10:11 PM Full Code [id number]  Demetrios Loll, MD Inpatient   04/01/2015  3:58 PM 04/05/2015  8:07 PM Full Code [id number]  Epifanio Lesches, MD ED   03/06/2015  8:05 AM 03/09/2015  9:11 PM Full Code [id number]  Juluis Mire, MD Inpatient   02/19/2015  4:14 PM 02/24/2015  5:41 PM Full Code [id number]  Aldean Jewett, MD Inpatient          Follow-up Information    Follow up with Robin Sia, MD On 10/22/2015.   Specialty:  Internal Medicine   Why:  @9am    Contact information:   [place] [address] [phone]       Discharge Medications     Medication List    TAKE these medications        albuterol (2.5 MG/3ML) 0.083% nebulizer solution  Commonly known as:  PROVENTIL  Take 2.5 mg by nebulization every 4 (four) hours as needed for wheezing or shortness of breath.     aspirin EC 81 MG tablet  Take 81 mg by mouth daily.     atorvastatin 20 MG tablet  Commonly known as:  LIPITOR  Take 20 mg by mouth every evening.     b complex-vitamin c-folic acid 0.8 MG Tabs tablet  Take 1 tablet by mouth daily.     carvedilol 3.125 MG tablet  Commonly known as:  COREG  Take 8 tablets (25 mg total) by mouth 2 (two) times daily.     cetirizine 10 MG tablet  Commonly known as:  ZYRTEC  Take 10 mg by mouth daily.     citalopram 20 MG tablet  Commonly known as:  CELEXA  Take 20 mg by mouth daily.     clopidogrel 75 MG tablet  Commonly known as:  PLAVIX  Take 75 mg by mouth daily.     feeding supplement (PRO-STAT SUGAR FREE 64) Liqd  Take 30 mLs by mouth 2 (two) times daily between meals.     glucose 4 GM chewable tablet  Chew 3-4 tablets by mouth as needed for low blood sugar (if pts blood sugar is less than 60).      HYDROcodone-acetaminophen 5-325 MG tablet  Commonly known as:  NORCO/VICODIN  Take 1 tablet by mouth 3 (three) times daily as needed for severe pain.     hydrocortisone 25 MG suppository  Commonly known as:  ANUSOL-HC  Place 25 mg rectally 2 (two) times daily.     lipase/protease/amylase 12000 units Cpep capsule  Commonly known as:  CREON  Take 12,000 Units by mouth 3 (three) times daily with meals.     metoCLOPramide 5 MG tablet  Commonly known as:  REGLAN  Take 1 tablet (5 mg total) by mouth 4 (four) times daily -  before meals and at bedtime.     nitroGLYCERIN 0.4 MG SL tablet  Commonly known as:  NITROSTAT  Place 0.4 mg under the tongue every 5 (five) minutes x 3 doses as needed for chest pain. *Max 3 doses per episode*     pregabalin 25 MG capsule  Commonly known as:  LYRICA  Take 25 mg by mouth 2 (two) times daily as needed (for leg pain).     promethazine 12.5 MG tablet  Commonly known as:  PHENERGAN  Take 12.5 mg by mouth every 6 (six) hours as needed for nausea or vomiting.     ranitidine 150 MG tablet  Commonly known as:  ZANTAC  Take 150 mg by mouth daily.     sevelamer carbonate 800 MG tablet  Commonly known as:  RENVELA  Take 1,600 mg by mouth 3 (three) times daily with meals.     vitamin B-12 1000 MCG tablet  Commonly known as:  CYANOCOBALAMIN  Take 1,000 mcg by mouth daily.     Vitamin D3 400 units tablet  Take 400 Units by mouth daily.           Total Time in preparing paper work, data evaluation and todays exam - 35 minutes  Dustin Flock M.D on 10/12/2015 at 3:17 PM  [facility]   Office  [phone]

## 2015-10-12 NOTE — Discharge Instructions (Signed)
°  DIET:  Renal diet  DISCHARGE CONDITION:  Stable  ACTIVITY:  Activity as tolerated  OXYGEN:  Home Oxygen: Yes.     Oxygen Delivery: 2 liters/min via Patient connected to nasal cannula oxygen  DISCHARGE LOCATION:  home    ADDITIONAL DISCHARGE INSTRUCTION:   If you experience worsening of your admission symptoms, develop shortness of breath, life threatening emergency, suicidal or homicidal thoughts you must seek medical attention immediately by calling 911 or calling your MD immediately  if symptoms less severe.  You Must read complete instructions/literature along with all the possible adverse reactions/side effects for all the Medicines you take and that have been prescribed to you. Take any new Medicines after you have completely understood and accpet all the possible adverse reactions/side effects.   Please note  You were cared for by a hospitalist during your hospital stay. If you have any questions about your discharge medications or the care you received while you were in the hospital after you are discharged, you can call the unit and asked to speak with the hospitalist on call if the hospitalist that took care of you is not available. Once you are discharged, your primary care physician will handle any further medical issues. Please note that NO REFILLS for any discharge medications will be authorized once you are discharged, as it is imperative that you return to your primary care physician (or establish a relationship with a primary care physician if you do not have one) for your aftercare needs so that they can reassess your need for medications and monitor your lab values.

## 2015-10-12 NOTE — Care Management (Signed)
Resumption of home health orders faxed to [facility]

## 2015-10-12 NOTE — Progress Notes (Signed)
Pt alert and oriented. Discharge summary given to pt. Concerns addressed. IV site removed. Pt discharged to home.

## 2015-10-12 NOTE — Progress Notes (Signed)
Subjective:  Patient seen and evaluated during dialysis. Her weight is currently down to 71.1 kg. Overall she is doing better with aggressive ultrafiltration.   Objective:  Vital signs in last 24 hours:  Temp:  [97.6 F (36.4 C)-98.7 F (37.1 C)] 97.6 F (36.4 C) (01/20 1000) Pulse Rate:  [60-71] 65 (01/20 0437) Resp:  [9-22] 18 (01/20 0437) BP: (137-166)/(49-67) 148/51 mmHg (01/20 0437) SpO2:  [100 %] 100 % (01/20 0437) Weight:  [71.1 kg (156 lb 12 oz)] 71.1 kg (156 lb 12 oz) (01/20 1000)  Weight change:  Filed Weights   10/10/15 1400 10/10/15 1750 10/12/15 1000  Weight: 74.8 kg (164 lb 14.5 oz) 70.806 kg (156 lb 1.6 oz) 71.1 kg (156 lb 12 oz)    Intake/Output:    Intake/Output Summary (Last 24 hours) at 10/12/15 1157 Last data filed at 10/12/15 0932  Gross per 24 hour  Intake      0 ml  Output   2500 ml  Net  -2500 ml     Physical Exam: General: NAD  HEENT Moist oral mucus membranes  Neck supple  Pulm/lungs CTAB, normal effort  CVS/Heart Regular, no rub  Abdomen:  Soft, non tender  Extremities: Trace LE and UE edema  Neurologic:  alert, able to follow commands   Skin: No acute rashes  Access: Left arm AVF       Basic Metabolic Panel:   Recent Labs Lab 10/07/15 0358 10/08/15 0454 10/11/15 0730 10/12/15 0535  NA 134* 138 136 137  K 4.1 5.5* 3.4* 3.5  CL 93* 101 97* 97*  CO2 30 28 34* 33*  GLUCOSE 130* 79 84 80  BUN 17 11 13 15   CREATININE 4.44* 3.19* 2.50* 2.47*  CALCIUM 8.9 8.4* 8.4* 8.4*  MG 2.3  --   --   --   PHOS 4.4  --  2.4* 2.4*     CBC:  Recent Labs Lab 10/07/15 0358 10/08/15 1405 10/11/15 0730 10/12/15 0535  WBC 6.0 6.5 5.0 5.5  HGB 10.3* 9.7* 9.5* 9.5*  HCT 31.2* 30.1* 29.9* 30.2*  MCV 82.7 82.8 83.1 83.8  PLT 191 165 198 168      Microbiology:  Recent Results (from the past 720 hour(s))  MRSA PCR Screening     Status: None   Collection Time: 10/03/15  4:00 AM  Result Value Ref Range Status   MRSA by PCR  NEGATIVE NEGATIVE Final    Comment:        The GeneXpert MRSA Assay (FDA approved for NASAL specimens only), is one component of a comprehensive MRSA colonization surveillance program. It is not intended to diagnose MRSA infection nor to guide or monitor treatment for MRSA infections.   MRSA PCR Screening     Status: None   Collection Time: 10/07/15 10:08 AM  Result Value Ref Range Status   MRSA by PCR NEGATIVE NEGATIVE Final    Comment:        The GeneXpert MRSA Assay (FDA approved for NASAL specimens only), is one component of a comprehensive MRSA colonization surveillance program. It is not intended to diagnose MRSA infection nor to guide or monitor treatment for MRSA infections.   Body fluid culture     Status: None (Preliminary result)   Collection Time: 10/11/15 12:13 PM  Result Value Ref Range Status   Specimen Description PLEURAL  Final   Special Requests NONE  Final   Gram Stain PENDING  Incomplete   Culture NO GROWTH < 24 HOURS  Final   Report Status PENDING  Incomplete    Coagulation Studies: No results for input(s): LABPROT, INR in the last 72 hours.  Urinalysis: No results for input(s): COLORURINE, LABSPEC, PHURINE, GLUCOSEU, HGBUR, BILIRUBINUR, KETONESUR, PROTEINUR, UROBILINOGEN, NITRITE, LEUKOCYTESUR in the last 72 hours.  Invalid input(s): APPERANCEUR    Imaging: Dg Chest 1 View  10/11/2015  CLINICAL DATA:  Pleural effusions, post right thoracentesis EXAM: CHEST  1 VIEW COMPARISON:  10/07/2015 FINDINGS: Near complete resolution of the visualized right pleural effusion. No pneumothorax. Persistent moderate left pleural effusion. Persisting consolidation/atelectasis at the left lung base with air bronchograms. Improved aeration of the right lung base. Mild diffuse interstitial edema and central pulmonary vascular congestion. Heart size upper limits normal for technique. Bilateral brachial and left subclavian vascular stents are noted. New IMPRESSION: 1.  No pneumothorax post right thoracentesis, with no residual effusion evident. 2. Persistent moderate left pleural effusion with adjacent consolidation/atelectasis. 3. Stable interstitial edema. Electronically Signed   By: Lucrezia Europe M.D.   On: 10/11/2015 14:18   US Thoracentesis Asp Pleural Space W/img Guide  10/11/2015  CLINICAL DATA:  Bilateral pleural effusions. Shortness of breath. Tolerated previous thoracentesis 02/21/2015. EXAM: EXAM THORACENTESIS WITH ULTRASOUND TECHNIQUE: The procedure, risks (including but not limited to bleeding, infection, organ damage ), benefits, and alternatives were explained to the patient. Questions regarding the procedure were encouraged and answered. The patient understands and consents to the procedure. Survey ultrasound of the right hemithorax was performed and an appropriate skin entry site was localized. Site was marked, prepped with Betadine, draped in usual sterile fashion, infiltrated locally with 1% lidocaine. The Saf-T-Centesis needle was advanced into the pleural space. Dark amber fluid returned. 1 L was removed. Post procedure ultrasound shows no residual fluid. The patient tolerated procedure well. COMPLICATIONS: COMPLICATIONS None immediate IMPRESSION: Technically successful ultrasound-guided right thoracentesis. Follow-up chest radiograph pending. Electronically Signed   By: Lucrezia Europe M.D.   On: 10/11/2015 14:16     Medications:     . aspirin EC  81 mg Oral Daily  . atorvastatin  20 mg Oral QPM  . carvedilol  25 mg Oral BID  . cholecalciferol  400 Units Oral Daily  . citalopram  20 mg Oral Daily  . clopidogrel  75 mg Oral Daily  . epoetin (EPOGEN/PROCRIT) injection  4,000 Units Intravenous Q M,W,F-HD  . famotidine  10 mg Oral Daily  . feeding supplement (PRO-STAT SUGAR FREE 64)  30 mL Oral BID BM  . heparin  5,000 Units Subcutaneous 3 times per day  . hydrALAZINE  25 mg Oral 3 times per day  . hydrocortisone  25 mg Rectal BID  .  lipase/protease/amylase  12,000 Units Oral TID WC  . loratadine  10 mg Oral Daily  . metoCLOPramide  5 mg Oral TID AC & HS  . multivitamin  1 tablet Oral QHS  . sevelamer carbonate  1,600 mg Oral TID WC  . sodium chloride  3 mL Intravenous Q12H  . vitamin B-12  1,000 mcg Oral Daily   sodium chloride, acetaminophen **OR** acetaminophen, albuterol, HYDROcodone-acetaminophen, pregabalin, promethazine, sodium chloride  Assessment/ Plan:  55 y.o. 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 admitted for bradycardia and hyperkalemia    [facility] MWF [place]  1. End Stage Renal Disease:  -  Patient seen and evaluated during dialysis today.  We hope for another 2 kg of ultrafiltration today.  Hopefully this will get her under 70 kg.  2. Anemia of chronic kidney disease:  - hemoglobin currently 9.5.  Continue Epogen 4000 units IV with dialysis.   3. Secondary Hyperparathyroidism:  - continue renvela 1600mg  po tid/wm.  4. Acute pulmonary edema and generalized anasarca -As above the patient does appear to have decreasing weight.  Hopefully her weight will be less than 70 kg after dialysis today.  She will likely need aggressive ultrafiltration as an outpatient and may need to be offered extra treatments.  LOS: 5 Auda Finfrock 1/20/201711:57 AM

## 2015-10-12 NOTE — Progress Notes (Signed)
SUBJECTIVE: Patient is feeling much better no chest pain or shortness of breath   Filed Vitals:   10/11/15 1715 10/11/15 1717 10/11/15 2149 10/12/15 0437  BP: 154/56 152/57 137/49 148/51  Pulse: 63 62 71 65  Temp:  97.7 F (36.5 C) 98.7 F (37.1 C) 98.2 F (36.8 C)  TempSrc:  Oral Oral Oral  Resp: 12 14 18 18   Height:      Weight:      SpO2:   100% 100%    Intake/Output Summary (Last 24 hours) at 10/12/15 0850 Last data filed at 10/12/15 0700  Gross per 24 hour  Intake    360 ml  Output   2500 ml  Net  -2140 ml    LABS: Basic Metabolic Panel:  Recent Labs  10/11/15 0730  NA 136  K 3.4*  CL 97*  CO2 34*  GLUCOSE 84  BUN 13  CREATININE 2.50*  CALCIUM 8.4*  PHOS 2.4*   Liver Function Tests:  Recent Labs  10/11/15 0730  ALBUMIN 2.7*   No results for input(s): LIPASE, AMYLASE in the last 72 hours. CBC:  Recent Labs  10/11/15 0730 10/12/15 0535  WBC 5.0 5.5  HGB 9.5* 9.5*  HCT 29.9* 30.2*  MCV 83.1 83.8  PLT 198 168   Cardiac Enzymes: No results for input(s): CKTOTAL, CKMB, CKMBINDEX, TROPONINI in the last 72 hours. BNP: Invalid input(s): POCBNP D-Dimer: No results for input(s): DDIMER in the last 72 hours. Hemoglobin A1C: No results for input(s): HGBA1C in the last 72 hours. Fasting Lipid Panel: No results for input(s): CHOL, HDL, LDLCALC, TRIG, CHOLHDL, LDLDIRECT in the last 72 hours. Thyroid Function Tests: No results for input(s): TSH, T4TOTAL, T3FREE, THYROIDAB in the last 72 hours.  Invalid input(s): FREET3 Anemia Panel: No results for input(s): VITAMINB12, FOLATE, FERRITIN, TIBC, IRON, RETICCTPCT in the last 72 hours.   PHYSICAL EXAM General: Well developed, well nourished, in no acute distress HEENT:  Normocephalic and atramatic Neck:  No JVD.  Lungs: Clear bilaterally to auscultation and percussion. Heart: HRRR . Normal S1 and S2 without gallops or murmurs.  Abdomen: Bowel sounds are positive, abdomen soft and non-tender  Msk:   Back normal, normal gait. Normal strength and tone for age. Extremities: No clubbing, cyanosis or edema.   Neuro: Alert and oriented X 3. Psych:  Good affect, responds appropriately  TELEMETRY: Sinus rhythm about 70 bpm  ASSESSMENT AND PLAN: CHF with cardiomyopathy ejection fraction 40%. We will go up on Corag to 25 twice [name] day today as blood pressure is high.  Active Problems:   Acute respiratory failure (HCC)    [name],Robin A, MD, [facility] 10/12/2015 8:50 AM

## 2015-10-12 NOTE — Progress Notes (Signed)
SATURATION QUALIFICATIONS: (This note is used to comply with regulatory documentation for home oxygen)  Patient Saturations on Room Air at Rest = 99  Patient Saturations on Room Air while Ambulating = 91  Patient Saturations on  Liters of oxygen while Ambulating = n/a  Please briefly explain why patient needs home oxygen:  Patient did not need oxygen while sitting or while ambulating in hall. Patient did not complain of shortness of breath. Pt did state she felt "wobbly" one time while walking, yet readjusted focus and was able to continue ambulating with walker without complications.

## 2015-10-12 NOTE — Care Management (Signed)
Portable O2 has been set up and ordered.  Last qualifying saturations documented 10/07/17.  Will need to be reassessed.  Bedside RN  Notified.

## 2015-10-15 LAB — CYTOLOGY - NON PAP

## 2015-10-15 LAB — BODY FLUID CULTURE: Culture: NO GROWTH

## 2015-10-20 IMAGING — CR DG ABDOMEN 2V
3 series · 3 of 3 positions shown · non-contrast
Comparison: 06/05/2015.

CLINICAL DATA: Diarrhea and constipation for the past 2 weeks.
Right upper quadrant abdominal pain since this morning.

EXAM:
ABDOMEN - 2 VIEW

[abdomen erect]
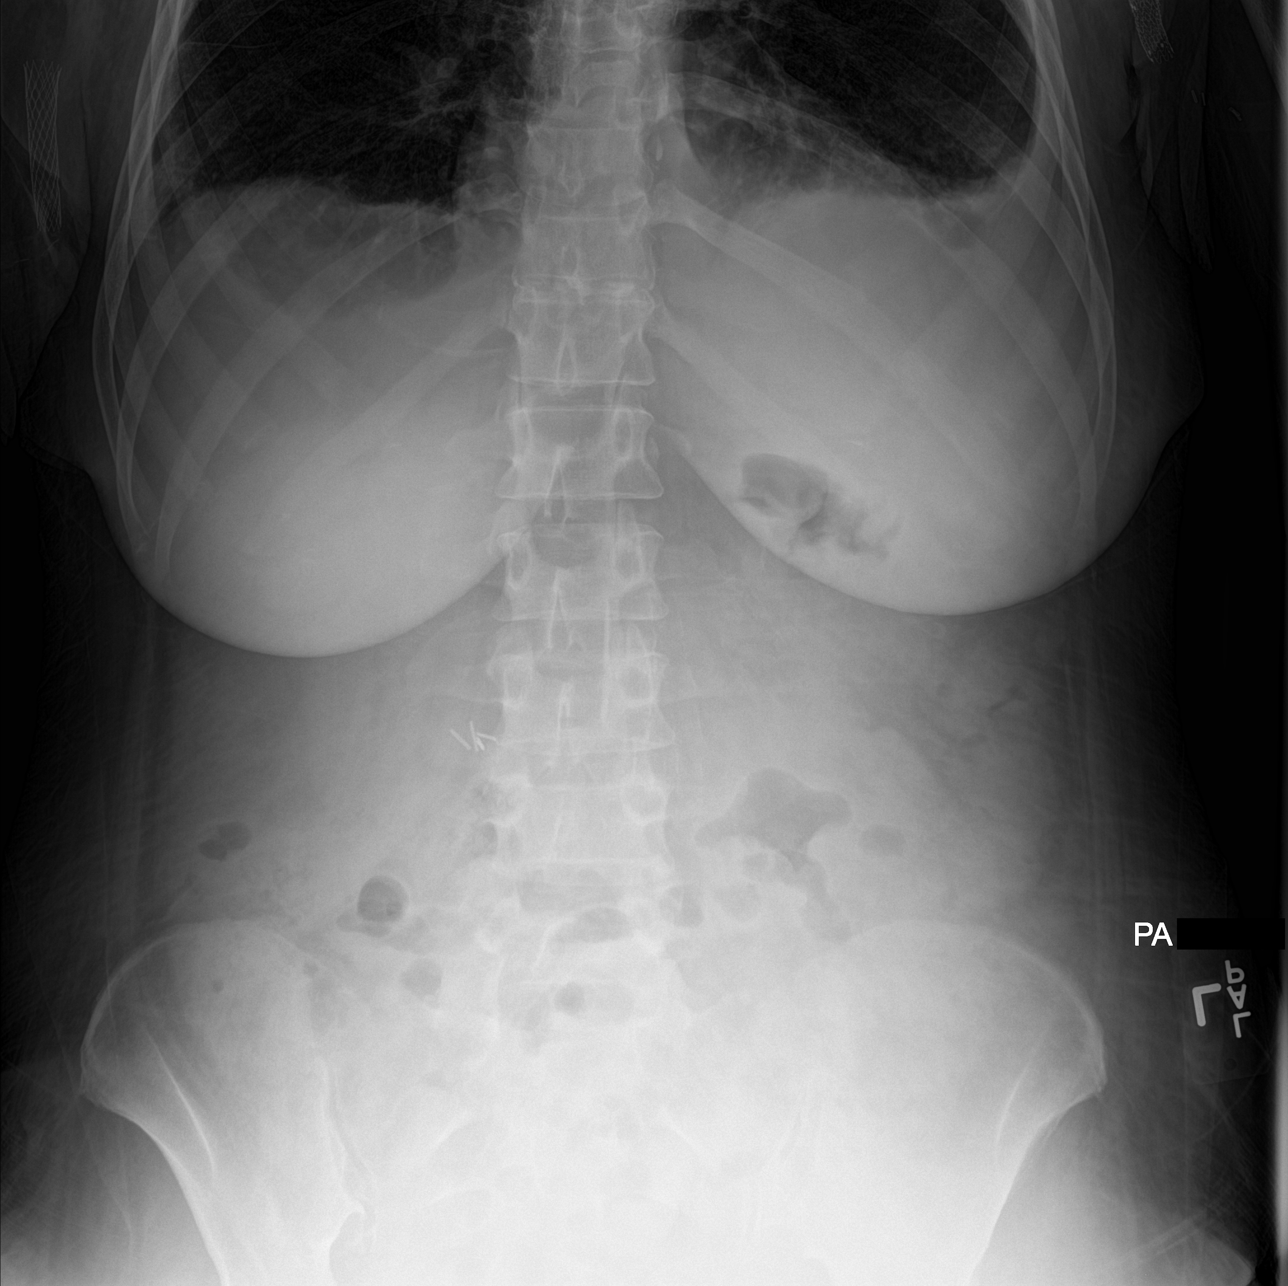

[abdomen supine (1 of 2)]
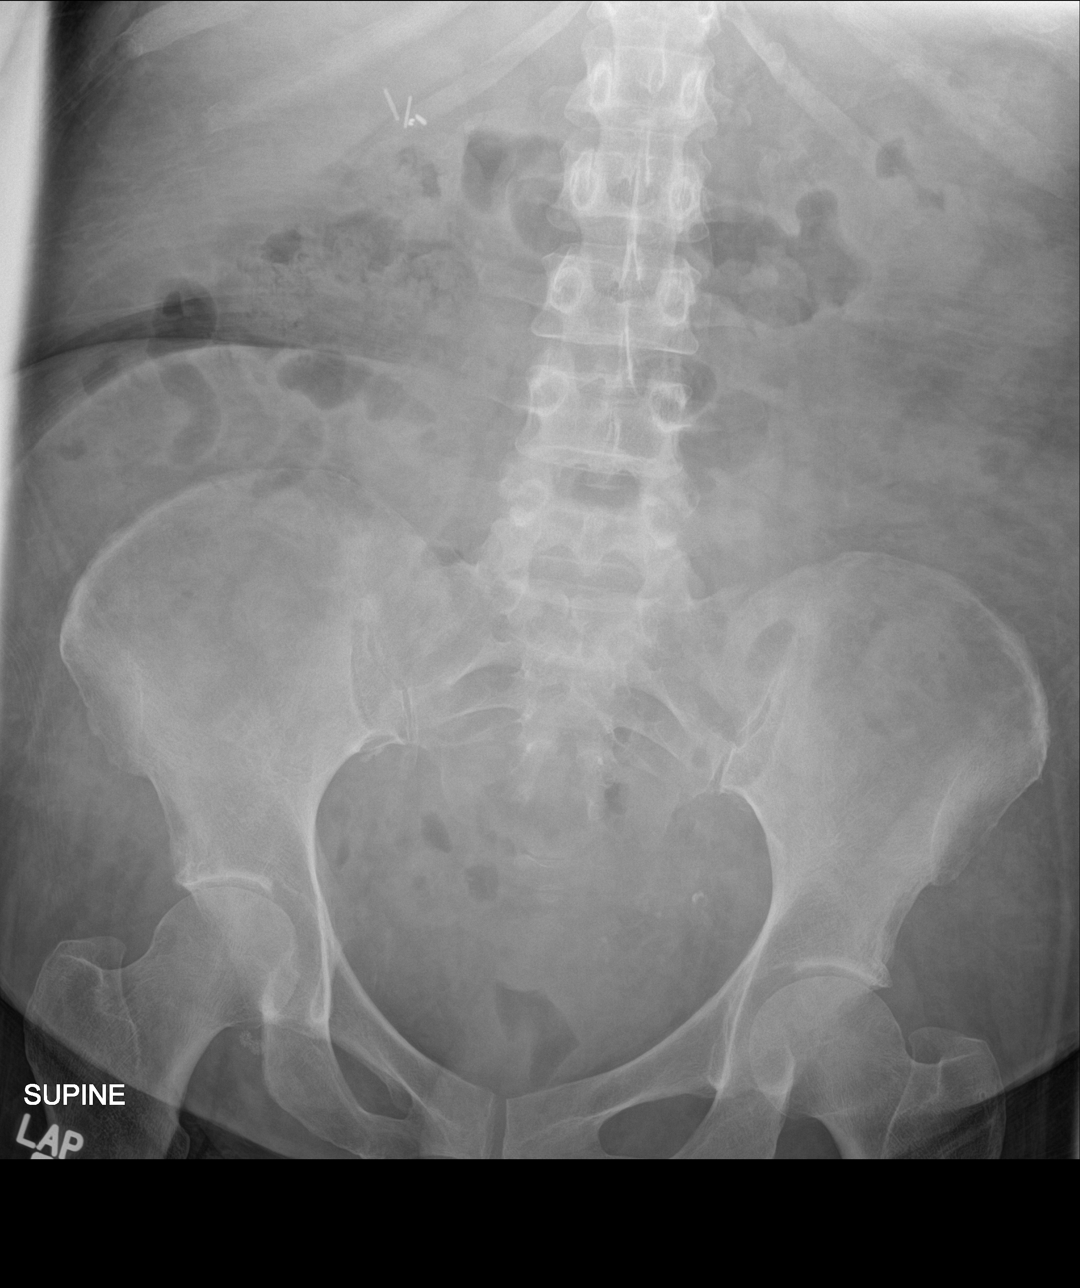

[abdomen supine (2 of 2)]
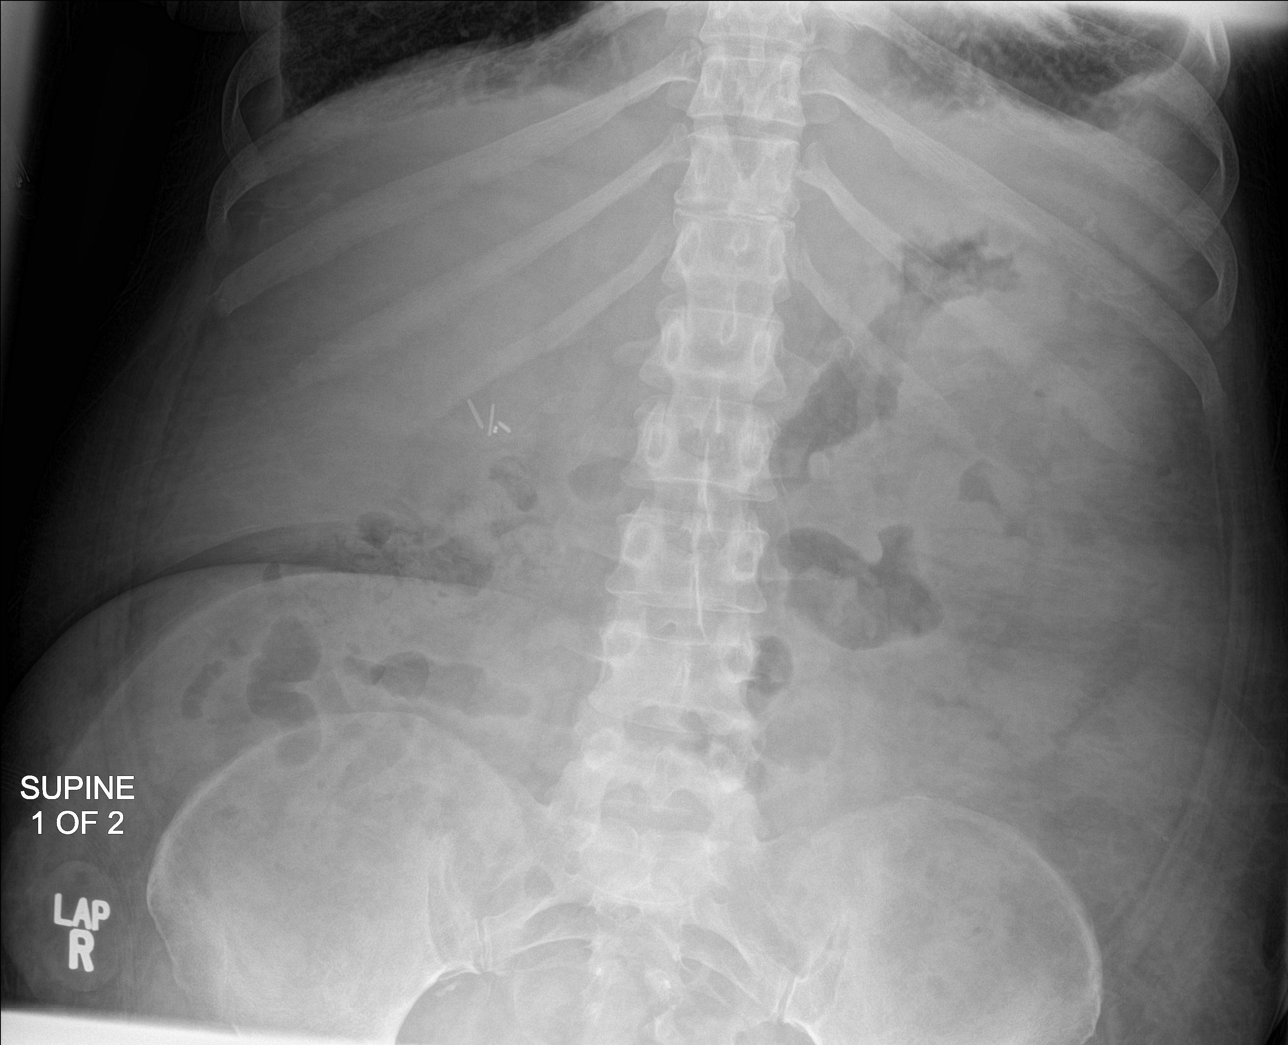

[3 of 3 positions shown; findings below may reference images not displayed]

FINDINGS: Normal bowel gas pattern without free peritoneal air.
Cholecystectomy clips. Unremarkable bones.
IMPRESSION: No acute abnormality.

## 2015-10-22 ENCOUNTER — Emergency Department
Admission: EM | Admit: 2015-10-22 | Discharge: 2015-10-22 | Disposition: A | Discharge status: Home / Self Care | Payer: Medicare Other | Attending: Emergency Medicine | Admitting: Emergency Medicine

## 2015-10-22 ENCOUNTER — Encounter: Payer: Self-pay | Admitting: Emergency Medicine

## 2015-10-22 ENCOUNTER — Emergency Department: Payer: Medicare Other

## 2015-10-22 DIAGNOSIS — I120 Hypertensive chronic kidney disease with stage 5 chronic kidney disease or end stage renal disease: Secondary | ICD-10-CM | POA: Insufficient documentation

## 2015-10-22 DIAGNOSIS — R1011 Right upper quadrant pain: Secondary | ICD-10-CM | POA: Diagnosis present

## 2015-10-22 DIAGNOSIS — Z7952 Long term (current) use of systemic steroids: Secondary | ICD-10-CM | POA: Diagnosis not present

## 2015-10-22 DIAGNOSIS — Z7902 Long term (current) use of antithrombotics/antiplatelets: Secondary | ICD-10-CM | POA: Diagnosis not present

## 2015-10-22 DIAGNOSIS — E1143 Type 2 diabetes mellitus with diabetic autonomic (poly)neuropathy: Secondary | ICD-10-CM | POA: Diagnosis not present

## 2015-10-22 DIAGNOSIS — E1140 Type 2 diabetes mellitus with diabetic neuropathy, unspecified: Secondary | ICD-10-CM | POA: Insufficient documentation

## 2015-10-22 DIAGNOSIS — R1013 Epigastric pain: Secondary | ICD-10-CM

## 2015-10-22 DIAGNOSIS — Z79899 Other long term (current) drug therapy: Secondary | ICD-10-CM | POA: Diagnosis not present

## 2015-10-22 DIAGNOSIS — Z9049 Acquired absence of other specified parts of digestive tract: Secondary | ICD-10-CM | POA: Diagnosis not present

## 2015-10-22 DIAGNOSIS — Z7982 Long term (current) use of aspirin: Secondary | ICD-10-CM | POA: Insufficient documentation

## 2015-10-22 DIAGNOSIS — F17210 Nicotine dependence, cigarettes, uncomplicated: Secondary | ICD-10-CM | POA: Insufficient documentation

## 2015-10-22 DIAGNOSIS — K3184 Gastroparesis: Secondary | ICD-10-CM

## 2015-10-22 DIAGNOSIS — N186 End stage renal disease: Secondary | ICD-10-CM | POA: Diagnosis not present

## 2015-10-22 DIAGNOSIS — E11649 Type 2 diabetes mellitus with hypoglycemia without coma: Secondary | ICD-10-CM | POA: Insufficient documentation

## 2015-10-22 LAB — BASIC METABOLIC PANEL
Anion gap: 10 (ref 5–15)
BUN: 14 mg/dL (ref 6–20)
CO2: 31 mmol/L (ref 22–32)
Calcium: 8.5 mg/dL — ABNORMAL LOW (ref 8.9–10.3)
Chloride: 94 mmol/L — ABNORMAL LOW (ref 101–111)
Creatinine, Ser: 2.83 mg/dL — ABNORMAL HIGH (ref 0.44–1.00)
GFR calc Af Amer: 20 mL/min — ABNORMAL LOW (ref >60)
GFR calc non Af Amer: 18 mL/min — ABNORMAL LOW (ref >60)
Glucose, Bld: 70 mg/dL (ref 65–99)
Potassium: 3.8 mmol/L (ref 3.5–5.1)
Sodium: 135 mmol/L (ref 135–145)

## 2015-10-22 LAB — CBC
HCT: 33 % — ABNORMAL LOW (ref 35.0–47.0)
Hemoglobin: 10.7 g/dL — ABNORMAL LOW (ref 12.0–16.0)
MCH: 26.9 pg (ref 26.0–34.0)
MCHC: 32.4 g/dL (ref 32.0–36.0)
MCV: 83 fL (ref 80.0–100.0)
Platelets: 273 10*3/uL (ref 150–440)
RBC: 3.98 MIL/uL (ref 3.80–5.20)
RDW: 20.4 % — ABNORMAL HIGH (ref 11.5–14.5)
WBC: 6.2 10*3/uL (ref 3.6–11.0)

## 2015-10-22 LAB — BRAIN NATRIURETIC PEPTIDE: B Natriuretic Peptide: 1033 pg/mL — ABNORMAL HIGH (ref 0.0–100.0)

## 2015-10-22 LAB — LIPASE, BLOOD: Lipase: 14 U/L (ref 11–51)

## 2015-10-22 LAB — TROPONIN I: Troponin I: <0.03 ng/mL (ref <0.031)

## 2015-10-22 MED ORDER — HYDROCODONE-ACETAMINOPHEN 5-325 MG PO TABS: 1.0000 | ORAL_TABLET | Freq: Four times a day (QID) | ORAL | Status: DC | PRN

## 2015-10-22 MED ORDER — PROMETHAZINE HCL 12.5 MG PO TABS: ORAL_TABLET | ORAL | Status: DC

## 2015-10-22 MED ORDER — HYDROCODONE-ACETAMINOPHEN 5-325 MG PO TABS
1.0000 | ORAL_TABLET | Freq: Once | ORAL | Status: AC
  Administered 2015-10-22: 1 via ORAL
  Filled 2015-10-22: qty 1

## 2015-10-22 MED ORDER — MORPHINE SULFATE (PF) 4 MG/ML IV SOLN
4.0000 mg | INTRAVENOUS | Status: DC | PRN
  Administered 2015-10-22: 4 mg via INTRAVENOUS
  Filled 2015-10-22: qty 1

## 2015-10-22 MED ORDER — PROMETHAZINE HCL 25 MG/ML IJ SOLN
25.0000 mg | Freq: Once | INTRAMUSCULAR | Status: AC
  Administered 2015-10-22: 25 mg via INTRAVENOUS
  Filled 2015-10-22: qty 1

## 2015-10-22 MED ORDER — PROMETHAZINE HCL 25 MG RE SUPP: 25.0000 mg | Freq: Four times a day (QID) | RECTAL | Status: DC | PRN

## 2015-10-22 NOTE — ED Provider Notes (Signed)
[facility] Emergency Department Provider Note  ____________________________________________  Time seen: Approximately 5:03 PM  I have reviewed the triage vital signs and the nursing notes.   HISTORY  Chief Complaint Chest Pain    HPI [name] is a 55 y.o. female with a history of ESRD on HD,CAD status post stents, pancreatitis, gastroparesis status post cholecystectomy presenting with epigastric and right upper quadrant pain associated with nausea and vomiting. Patient went to dialysis, which she completed, earlier today. On arrival home she developed epigastric and right upper quadrant pain associated with multiple episodes of nausea and vomiting. She had 1 loose nonbloody stool. No fever, chills, shortness of breath, chest pain or lightheadedness or syncope. This feels similar to her previous gastroparesis.   Past Medical History  Diagnosis Date  . Asthma   . Collagen vascular disease ([place])   . Diabetes mellitus without complication ([place])   . Hypertension   . Coronary artery disease     a. cath 2013: stenting to RCA (report not available); b. cath 2014: LM nl, pLAD 40%, mLAD nl, ost LCx 40%, mid LCx nl, pRCA 30% @ site of prior stent, mRCA 50%  . Diabetic neuropathy ([place])   . ESRD (end stage renal disease) on dialysis ([place])     M-W-F  . GERD (gastroesophageal reflux disease)   . Hx of pancreatitis 2015  . Myocardial infarction ([place])   . Pneumonia   . Mitral regurgitation     a. echo 10/2013: EF 62%, noWMA, mildly dilated LA, mild to mod MR/TR, GR1DD  . COPD (chronic obstructive pulmonary disease) ([place])   . chronic diastolic CHF [id number]  . dialysis 2006  . Depression   . Anxiety   . Arthritis   . Headache   . Anemia   . Renal insufficiency     Patient Active Problem List   Diagnosis Date Noted  . Hyperkalemia 10/03/2015  . C. difficile diarrhea 07/22/2015  . Pneumonia 05/21/2015  . Hypoglycemia 04/24/2015  . Unresponsiveness  04/24/2015  . Bradycardia 04/24/2015  . Hypothermia 04/24/2015  . Acute respiratory failure ([place]) 04/24/2015  . Acute diastolic CHF (congestive heart failure) ([place]) 04/05/2015  . Diabetic gastroparesis ([place]) 04/05/2015  . Hypokalemia 04/05/2015  . Generalized weakness 04/05/2015  . Acute pulmonary edema ([place]) 04/03/2015  . Nausea and vomiting 04/03/2015  . Hypoglycemia associated with diabetes ([place]) 04/03/2015  . Anemia of chronic disease 04/03/2015  . Secondary hyperparathyroidism ([place]) 04/03/2015  . Pressure ulcer 04/02/2015  . Acute respiratory failure with hypoxia ([place]) 04/01/2015  . Adjustment disorder with anxiety 03/14/2015  . Somatic symptom disorder, mild 03/08/2015  . Coronary artery disease involving native coronary artery of native heart without angina pectoris   . Nausea & vomiting 03/06/2015  . Abdominal pain 03/06/2015  . DM (diabetes mellitus) ([place]) 03/06/2015  . HTN (hypertension) 03/06/2015  . Gastroparesis 02/24/2015  . Pleural effusion 02/19/2015  . HCAP (healthcare-associated pneumonia) 02/19/2015  . End-stage renal disease on hemodialysis ([place]) 02/19/2015    Past Surgical History  Procedure Laterality Date  . Cholecystectomy    . Appendectomy    . Abdominal hysterectomy    . Dialysis fistula creation Left     upper arm  . Esophagogastroduodenoscopy N/A 03/08/2015    Procedure: ESOPHAGOGASTRODUODENOSCOPY (EGD);  Surgeon: Manya Silvas, MD;  Location: [facility] ENDOSCOPY;  Service: Endoscopy;  Laterality: N/A;  . Cardiac catheterization Left 07/26/2015    Procedure: Left Heart Cath and Coronary Angiography;  Surgeon: Dionisio David, MD;  Location: [facility] LAB;  Service: Cardiovascular;  Laterality: Left;  Marland Kitchen Eye surgery    . Fecal transplant N/A 08/23/2015    Procedure: FECAL TRANSPLANT;  Surgeon: Manya Silvas, MD;  Location: [facility] ENDOSCOPY;  Service: Endoscopy;  Laterality: N/A;    Current Outpatient Rx  Name  Route  Sig  Dispense  Refill  .  albuterol (PROVENTIL) (2.5 MG/3ML) 0.083% nebulizer solution   Nebulization   Take 2.5 mg by nebulization every 4 (four) hours as needed for wheezing or shortness of breath.         . Amino Acids-Protein Hydrolys (FEEDING SUPPLEMENT, PRO-STAT SUGAR FREE 64,) LIQD   Oral   Take 30 mLs by mouth 2 (two) times daily between meals.   900 mL   0   . aspirin EC 81 MG tablet   Oral   Take 81 mg by mouth daily.         Marland Kitchen atorvastatin (LIPITOR) 20 MG tablet   Oral   Take 20 mg by mouth every evening.         Marland Kitchen b complex-vitamin c-folic acid (NEPHRO-VITE) 0.8 MG TABS tablet   Oral   Take 1 tablet by mouth daily.         . carvedilol (COREG) 3.125 MG tablet   Oral   Take 8 tablets (25 mg total) by mouth 2 (two) times daily. Patient taking differently: Take 3.125 mg by mouth 2 (two) times daily.    60 tablet   0   . cetirizine (ZYRTEC) 10 MG tablet   Oral   Take 10 mg by mouth daily.         . Cholecalciferol (VITAMIN D3) 400 UNITS tablet   Oral   Take 400 Units by mouth daily.         . citalopram (CELEXA) 20 MG tablet   Oral   Take 20 mg by mouth daily.         . clopidogrel (PLAVIX) 75 MG tablet   Oral   Take 75 mg by mouth daily.         Marland Kitchen glucose 4 GM chewable tablet   Oral   Chew 3-4 tablets by mouth as needed for low blood sugar (if pts blood sugar is less than 60).         Marland Kitchen HYDROcodone-acetaminophen (NORCO/VICODIN) 5-325 MG tablet   Oral   Take 1 tablet by mouth every 6 (six) hours as needed for severe pain.   6 tablet   0   . hydrocortisone (ANUSOL-HC) 25 MG suppository   Rectal   Place 25 mg rectally 2 (two) times daily.         . lipase/protease/amylase (CREON) 12000 UNITS CPEP capsule   Oral   Take 12,000 Units by mouth 3 (three) times daily with meals.          . metoCLOPramide (REGLAN) 5 MG tablet   Oral   Take 1 tablet (5 mg total) by mouth 4 (four) times daily -  before meals and at bedtime.   40 tablet   0   .  nitroGLYCERIN (NITROSTAT) 0.4 MG SL tablet   Sublingual   Place 0.4 mg under the tongue every 5 (five) minutes x 3 doses as needed for chest pain. *Max 3 doses per episode*         . pregabalin (LYRICA) 25 MG capsule   Oral   Take 25 mg by mouth 2 (two) times daily as needed (for  leg pain).         . promethazine (PHENERGAN) 12.5 MG tablet      1-2 tablets by mouth every 8 hours as needed for nausea and vomiting   20 tablet   0   . promethazine (PHENERGAN) 25 MG suppository   Rectal   Place 1 suppository (25 mg total) rectally every 6 (six) hours as needed for nausea.   12 suppository   1   . ranitidine (ZANTAC) 150 MG tablet   Oral   Take 150 mg by mouth daily.          . sevelamer carbonate (RENVELA) 800 MG tablet   Oral   Take 1,600 mg by mouth 3 (three) times daily with meals.          . vitamin B-12 (CYANOCOBALAMIN) 1000 MCG tablet   Oral   Take 1,000 mcg by mouth daily.           Allergies Compazine; Ace inhibitors; Ativan; Codeine; Gabapentin; Losartan; Ondansetron; Prochlorperazine; Scopolamine; Zofran; Oxycodone; and Tape  Family History  Problem Relation Age of Onset  . Kidney disease Mother   . Diabetes Mother     Social History Social History  Substance Use Topics  . Smoking status: Current Some Day Smoker -- 0.50 packs/day    Types: Cigarettes    Last Attempt to Quit: 02/13/2015  . Smokeless tobacco: Never Used  . Alcohol Use: No    Review of Systems Constitutional: No fever/chills area and no lightheadedness or syncope. Eyes: No visual changes. ENT: No sore throat. Cardiovascular: Denies chest pain, palpitations. Respiratory: Denies shortness of breath.  No cough. Gastrointestinal: Positive epigastric and right upper quadrant abdominal pain.  Positive nausea, positive vomiting.  Has a one episode of diarrhea.  No constipation. Genitourinary: Decreased urination. Musculoskeletal: Negative for back pain. Skin: Negative for  rash. Neurological: Negative for headaches, focal weakness or numbness.  10-point ROS otherwise negative.  ____________________________________________   PHYSICAL EXAM:  VITAL SIGNS: ED Triage Vitals  Enc Vitals Group     BP 10/22/15 1504 137/62 mmHg     Pulse Rate 10/22/15 1504 67     Resp 10/22/15 1504 22     Temp 10/22/15 1504 97.7 F (36.5 C)     Temp Source 10/22/15 1504 Oral     SpO2 10/22/15 1504 100 %     Weight 10/22/15 1504 151 lb (68.493 kg)     Height 10/22/15 1504 5\' 5"  (1.651 m)     Head Cir --      Peak Flow --      Pain Score 10/22/15 1504 8     Pain Loc --      Pain Edu? --      Excl. in [place]? --     Constitutional: Patient is alert and oriented and answering questions appropriate. She is chronically ill-appearing and uncomfortable with active heaving.  Eyes: Conjunctivae are normal.  EOMI. no scleral icterus. Head: Atraumatic. Nose: No congestion/rhinnorhea. Mouth/Throat: Mucous membranes are moist.  Neck: No stridor.  Supple.  Eyes no JVD. Cardiovascular: Normal rate, regular rhythm. No murmurs, rubs or gallops.  Respiratory: Normal respiratory effort.  No retractions. Lungs CTAB.  No wheezes, rales or ronchi. Gastrointestinal: Abdomen is soft and nondistended with tenderness to palpation in the epigastrium and right upper quadrant. Negative Murphy sign. No peritoneal signs or guarding or rebound. Musculoskeletal: No LE edema.  Neurologic:  Normal speech and language. No gross focal neurologic deficits are appreciated.  Skin:  Skin is warm, dry and intact. No rash noted. Psychiatric: Mood and affect are normal. Speech and behavior are normal.  Normal judgement  ____________________________________________   LABS (all labs ordered are listed, but only abnormal results are displayed)  Labs Reviewed  BASIC METABOLIC PANEL - Abnormal; Notable for the following:    Chloride 94 (*)    Creatinine, Ser 2.83 (*)    Calcium 8.5 (*)    GFR calc non Af Amer  18 (*)    GFR calc Af Amer 20 (*)    All other components within normal limits  CBC - Abnormal; Notable for the following:    Hemoglobin 10.7 (*)    HCT 33.0 (*)    RDW 20.4 (*)    All other components within normal limits  BRAIN NATRIURETIC PEPTIDE - Abnormal; Notable for the following:    B Natriuretic Peptide 1033.0 (*)    All other components within normal limits  TROPONIN I  LIPASE, BLOOD   ____________________________________________  EKG  ED ECG REPORT I, Eula Listen, the attending physician, personally viewed and interpreted this ECG.   Date: 10/22/2015  EKG Time: 1459  Rate: 88  Rhythm: prolonged QT interval  Axis: Normal  Intervals: prolonged QTC  ST&T Change: No ST elevation. No ischemic changes.  ____________________________________________  RADIOLOGY  Dg Chest 2 View  10/22/2015  CLINICAL DATA:  Left-sided chest pain after finishing dialysis today. EXAM: CHEST  2 VIEW COMPARISON:  10/11/2015 FINDINGS: Left-sided subclavian and axillary vascular stent. Midline trachea. Mild cardiomegaly. Small left pleural effusion is similar. A small right pleural effusion is new. No pneumothorax. Mild interstitial edema. Improved left base aeration with increased right base airspace disease. IMPRESSION: Mild congestive heart failure. Similar left pleural effusion with decreased left base atelectasis. New right pleural effusion with adjacent airspace disease, favored to represent atelectasis. Electronically Signed   By: Abigail Miyamoto M.D.   On: 10/22/2015 16:38   Dg Abd 2 Views  10/22/2015  CLINICAL DATA:  Left-sided chest pain after finishing dialysis today. Shortness of breath and nausea. History of diabetes, myocardial infarction, COPD and pancreatitis. EXAM: ABDOMEN - 2 VIEW COMPARISON:  Chest radiographs today and 10/11/2015. Abdominal CT 07/22/2015. FINDINGS: The bowel gas pattern is nonobstructive. There is no evidence of free intraperitoneal air or significant bowel  wall thickening. Cholecystectomy clips and scattered vascular calcifications are noted. The bones appear unremarkable. There are small to moderate bilateral pleural effusions with associated bibasilar pulmonary opacities, similar to earlier chest radiographs. Suspected generalized soft tissue edema. IMPRESSION: Anasarca with suspected generalized soft tissue edema and bilateral pleural effusions. Normal bowel gas pattern. Electronically Signed   By: Richardean Sale M.D.   On: 10/22/2015 17:44    ____________________________________________   PROCEDURES  Procedure(s) performed: None  Critical Care performed: No ____________________________________________   INITIAL IMPRESSION / ASSESSMENT AND PLAN / ED COURSE  Pertinent labs & imaging results that were available during my care of the patient were reviewed by me and considered in my medical decision making (see chart for details).  55 y.o. female with multiple chronic medical conditions, including gastroparesis, presenting with epigastric and right upper quadrant pain associated with nausea and vomiting. On my exam the patient is uncomfortable appearing with active vomiting. However, she is hemodynamically stable and afebrile. The patient has a normal lipase, normal electrolytes, a normal white blood cell count, and a chest x-ray that does not show any acute findings but does show some atelectasis with small pleural effusions. The most likely  etiology of the patient's symptoms is gastroparesis. In the past she has had to be admitted for this, but I will attempt symptomatic treatment and see if we can have her tolerate by mouth. Plan reevaluation after symptomatically treatment.  ----------------------------------------- 5:46 PM on 10/22/2015 -----------------------------------------  She is pain has significantly improved and her nausea is better but still present. I will re-dose her with Phenergan, and reevaluate the patient. If she is able  to tolerate by mouth, we will plan discharge home with close PMD follow-up.  ----------------------------------------- 6:17 PM on 10/22/2015 -----------------------------------------  The patient's nausea has resolved except for when she moves around a lot, and her pain is now mild and "dull." Plan discharge after by mouth challenge. ____________________________________________  FINAL CLINICAL IMPRESSION(S) / ED DIAGNOSES  Final diagnoses:  Gastroparesis      NEW MEDICATIONS STARTED DURING THIS VISIT:  New Prescriptions   PROMETHAZINE (PHENERGAN) 25 MG SUPPOSITORY    Place 1 suppository (25 mg total) rectally every 6 (six) hours as needed for nausea.     Eula Listen, MD 10/22/15 1824

## 2015-10-22 NOTE — ED Notes (Addendum)
Pt c/o left sided chest pain that started after finishing dialysis today. Also has had SHOB and nausea.  Hx MI, copd, esrd. Has also had epigastric pain. Took NTG X 1 at home but no relief. Instructed let RN know if sx change

## 2015-10-22 NOTE — ED Notes (Signed)
Patient was able to drink a cup of water without vomiting. MD aware.

## 2015-10-22 NOTE — ED Notes (Signed)
Patient transported to x-ray. ?

## 2015-10-22 NOTE — Discharge Instructions (Signed)
Please continue fluid restriction as instructed by your kidney doctor. You may take a bland BRAT for the next 2-3 days, then advance to your regular diet.  Return to the emergency department if you develop severe pain, fever, inability to keep down fluids, or any other symptoms concerning to you.

## 2015-10-25 ENCOUNTER — Emergency Department
Admission: EM | Admit: 2015-10-25 | Discharge: 2015-10-25 | Disposition: A | Discharge status: Home / Self Care | Payer: Medicare Other | Attending: Emergency Medicine | Admitting: Emergency Medicine

## 2015-10-25 ENCOUNTER — Emergency Department: Payer: Medicare Other

## 2015-10-25 ENCOUNTER — Encounter: Payer: Self-pay | Admitting: Emergency Medicine

## 2015-10-25 DIAGNOSIS — Z7902 Long term (current) use of antithrombotics/antiplatelets: Secondary | ICD-10-CM | POA: Insufficient documentation

## 2015-10-25 DIAGNOSIS — F17210 Nicotine dependence, cigarettes, uncomplicated: Secondary | ICD-10-CM | POA: Diagnosis not present

## 2015-10-25 DIAGNOSIS — E11649 Type 2 diabetes mellitus with hypoglycemia without coma: Secondary | ICD-10-CM | POA: Insufficient documentation

## 2015-10-25 DIAGNOSIS — Z79899 Other long term (current) drug therapy: Secondary | ICD-10-CM | POA: Insufficient documentation

## 2015-10-25 DIAGNOSIS — G43A0 Cyclical vomiting, not intractable: Secondary | ICD-10-CM | POA: Insufficient documentation

## 2015-10-25 DIAGNOSIS — R109 Unspecified abdominal pain: Secondary | ICD-10-CM | POA: Diagnosis present

## 2015-10-25 DIAGNOSIS — Z992 Dependence on renal dialysis: Secondary | ICD-10-CM | POA: Diagnosis not present

## 2015-10-25 DIAGNOSIS — N186 End stage renal disease: Secondary | ICD-10-CM | POA: Insufficient documentation

## 2015-10-25 DIAGNOSIS — E1140 Type 2 diabetes mellitus with diabetic neuropathy, unspecified: Secondary | ICD-10-CM | POA: Diagnosis not present

## 2015-10-25 DIAGNOSIS — Z7982 Long term (current) use of aspirin: Secondary | ICD-10-CM | POA: Insufficient documentation

## 2015-10-25 DIAGNOSIS — I120 Hypertensive chronic kidney disease with stage 5 chronic kidney disease or end stage renal disease: Secondary | ICD-10-CM | POA: Insufficient documentation

## 2015-10-25 DIAGNOSIS — R1115 Cyclical vomiting syndrome unrelated to migraine: Secondary | ICD-10-CM

## 2015-10-25 LAB — COMPREHENSIVE METABOLIC PANEL
ALT: 16 U/L (ref 14–54)
AST: 32 U/L (ref 15–41)
Albumin: 2.9 g/dL — ABNORMAL LOW (ref 3.5–5.0)
Alkaline Phosphatase: 230 U/L — ABNORMAL HIGH (ref 38–126)
Anion gap: 10 (ref 5–15)
BUN: 14 mg/dL (ref 6–20)
CO2: 33 mmol/L — ABNORMAL HIGH (ref 22–32)
Calcium: 8.8 mg/dL — ABNORMAL LOW (ref 8.9–10.3)
Chloride: 96 mmol/L — ABNORMAL LOW (ref 101–111)
Creatinine, Ser: 3.62 mg/dL — ABNORMAL HIGH (ref 0.44–1.00)
GFR calc Af Amer: 15 mL/min — ABNORMAL LOW (ref >60)
GFR calc non Af Amer: 13 mL/min — ABNORMAL LOW (ref >60)
Glucose, Bld: 84 mg/dL (ref 65–99)
Potassium: 3.5 mmol/L (ref 3.5–5.1)
Sodium: 139 mmol/L (ref 135–145)
Total Bilirubin: 1 mg/dL (ref 0.3–1.2)
Total Protein: 7.7 g/dL (ref 6.5–8.1)

## 2015-10-25 LAB — TROPONIN I: Troponin I: <0.03 ng/mL (ref <0.031)

## 2015-10-25 LAB — CBC
HCT: 35 % (ref 35.0–47.0)
Hemoglobin: 11.2 g/dL — ABNORMAL LOW (ref 12.0–16.0)
MCH: 27.3 pg (ref 26.0–34.0)
MCHC: 32 g/dL (ref 32.0–36.0)
MCV: 85.3 fL (ref 80.0–100.0)
Platelets: 285 10*3/uL (ref 150–440)
RBC: 4.11 MIL/uL (ref 3.80–5.20)
RDW: 21.8 % — ABNORMAL HIGH (ref 11.5–14.5)
WBC: 6.5 10*3/uL (ref 3.6–11.0)

## 2015-10-25 LAB — LIPASE, BLOOD: Lipase: 14 U/L (ref 11–51)

## 2015-10-25 MED ORDER — METOCLOPRAMIDE HCL 5 MG/ML IJ SOLN
10.0000 mg | Freq: Once | INTRAMUSCULAR | Status: AC
  Administered 2015-10-25: 10 mg via INTRAVENOUS
  Filled 2015-10-25: qty 2

## 2015-10-25 MED ORDER — TRAMADOL HCL 50 MG PO TABS: 50.0000 mg | ORAL_TABLET | Freq: Four times a day (QID) | ORAL | Status: DC | PRN

## 2015-10-25 MED ORDER — HYDROMORPHONE HCL 1 MG/ML IJ SOLN
1.0000 mg | Freq: Once | INTRAMUSCULAR | Status: AC
  Administered 2015-10-25: 1 mg via INTRAVENOUS
  Filled 2015-10-25: qty 1

## 2015-10-25 MED ORDER — PROMETHAZINE HCL 25 MG RE SUPP: 25.0000 mg | Freq: Four times a day (QID) | RECTAL | Status: DC | PRN

## 2015-10-25 NOTE — ED Notes (Signed)
Pt here with c/o RUQ pain with nausea and vomiting. Pt seen here 2 days ago and dx with pancreatitis. Pt denies any other symptoms at present.

## 2015-10-25 NOTE — ED Notes (Signed)
Pt O2 stats at 78% placed on 2L of O2, MD made aware.

## 2015-10-25 NOTE — ED Provider Notes (Signed)
Time Seen: Approximately ----------------------------------------- 8:15 AM on 10/25/2015 -----------------------------------------    I have reviewed the triage notes  Chief Complaint: Abdominal Pain   History of Present Illness: [name] is a 55 y.o. female who has history of multiple medical problems and presents with periods of cyclic vomiting. Patient was evaluated here a couple of days ago and was prescribed some Phenergan at home. Patient states she last took Phenergan at 4 AM and cannot get any control her vomiting. She has history of end-stage renal disease and normally gets her dialysis on Monday, Wednesday, and Fridays. Patient states she did receive her dialysis yesterday without event. Review of cardiovascular disease and denies any chest pain on this occasion states pain is exclusively in the epigastric towards the right upper quadrant region. Patient's had a previous cholecystectomy and denies any  radiation to the back or flank area   Past Medical History  Diagnosis Date  . Asthma   . Collagen vascular disease ([place])   . Diabetes mellitus without complication ([place])   . Hypertension   . Coronary artery disease     a. cath 2013: stenting to RCA (report not available); b. cath 2014: LM nl, pLAD 40%, mLAD nl, ost LCx 40%, mid LCx nl, pRCA 30% @ site of prior stent, mRCA 50%  . Diabetic neuropathy ([place])   . ESRD (end stage renal disease) on dialysis ([place])     M-W-F  . GERD (gastroesophageal reflux disease)   . Hx of pancreatitis 2015  . Myocardial infarction ([place])   . Pneumonia   . Mitral regurgitation     a. echo 10/2013: EF 62%, noWMA, mildly dilated LA, mild to mod MR/TR, GR1DD  . COPD (chronic obstructive pulmonary disease) ([place])   . chronic diastolic CHF [id number]  . dialysis 2006  . Depression   . Anxiety   . Arthritis   . Headache   . Anemia   . Renal insufficiency     Patient Active Problem List   Diagnosis Date Noted  . Hyperkalemia 10/03/2015  .  C. difficile diarrhea 07/22/2015  . Pneumonia 05/21/2015  . Hypoglycemia 04/24/2015  . Unresponsiveness 04/24/2015  . Bradycardia 04/24/2015  . Hypothermia 04/24/2015  . Acute respiratory failure ([place]) 04/24/2015  . Acute diastolic CHF (congestive heart failure) ([place]) 04/05/2015  . Diabetic gastroparesis ([place]) 04/05/2015  . Hypokalemia 04/05/2015  . Generalized weakness 04/05/2015  . Acute pulmonary edema ([place]) 04/03/2015  . Nausea and vomiting 04/03/2015  . Hypoglycemia associated with diabetes ([place]) 04/03/2015  . Anemia of chronic disease 04/03/2015  . Secondary hyperparathyroidism ([place]) 04/03/2015  . Pressure ulcer 04/02/2015  . Acute respiratory failure with hypoxia ([place]) 04/01/2015  . Adjustment disorder with anxiety 03/14/2015  . Somatic symptom disorder, mild 03/08/2015  . Coronary artery disease involving native coronary artery of native heart without angina pectoris   . Nausea & vomiting 03/06/2015  . Abdominal pain 03/06/2015  . DM (diabetes mellitus) ([place]) 03/06/2015  . HTN (hypertension) 03/06/2015  . Gastroparesis 02/24/2015  . Pleural effusion 02/19/2015  . HCAP (healthcare-associated pneumonia) 02/19/2015  . End-stage renal disease on hemodialysis ([place]) 02/19/2015    Past Surgical History  Procedure Laterality Date  . Cholecystectomy    . Appendectomy    . Abdominal hysterectomy    . Dialysis fistula creation Left     upper arm  . Esophagogastroduodenoscopy N/A 03/08/2015    Procedure: ESOPHAGOGASTRODUODENOSCOPY (EGD);  Surgeon: Manya Silvas, MD;  Location: [facility] ENDOSCOPY;  Service: Endoscopy;  Laterality: N/A;  .  Cardiac catheterization Left 07/26/2015    Procedure: Left Heart Cath and Coronary Angiography;  Surgeon: Dionisio David, MD;  Location: [place] CV LAB;  Service: Cardiovascular;  Laterality: Left;  Marland Kitchen Eye surgery    . Fecal transplant N/A 08/23/2015    Procedure: FECAL TRANSPLANT;  Surgeon: Manya Silvas, MD;  Location: [facility] ENDOSCOPY;   Service: Endoscopy;  Laterality: N/A;    Past Surgical History  Procedure Laterality Date  . Cholecystectomy    . Appendectomy    . Abdominal hysterectomy    . Dialysis fistula creation Left     upper arm  . Esophagogastroduodenoscopy N/A 03/08/2015    Procedure: ESOPHAGOGASTRODUODENOSCOPY (EGD);  Surgeon: Manya Silvas, MD;  Location: [facility] ENDOSCOPY;  Service: Endoscopy;  Laterality: N/A;  . Cardiac catheterization Left 07/26/2015    Procedure: Left Heart Cath and Coronary Angiography;  Surgeon: Dionisio David, MD;  Location: [place] CV LAB;  Service: Cardiovascular;  Laterality: Left;  Marland Kitchen Eye surgery    . Fecal transplant N/A 08/23/2015    Procedure: FECAL TRANSPLANT;  Surgeon: Manya Silvas, MD;  Location: [facility] ENDOSCOPY;  Service: Endoscopy;  Laterality: N/A;    Current Outpatient Rx  Name  Route  Sig  Dispense  Refill  . albuterol (PROVENTIL) (2.5 MG/3ML) 0.083% nebulizer solution   Nebulization   Take 2.5 mg by nebulization every 4 (four) hours as needed for wheezing or shortness of breath.         . Amino Acids-Protein Hydrolys (FEEDING SUPPLEMENT, PRO-STAT SUGAR FREE 64,) LIQD   Oral   Take 30 mLs by mouth 2 (two) times daily between meals.   900 mL   0   . aspirin EC 81 MG tablet   Oral   Take 81 mg by mouth daily.         Marland Kitchen atorvastatin (LIPITOR) 20 MG tablet   Oral   Take 20 mg by mouth every evening.         Marland Kitchen b complex-vitamin c-folic acid (NEPHRO-VITE) 0.8 MG TABS tablet   Oral   Take 1 tablet by mouth daily.         . carvedilol (COREG) 25 MG tablet   Oral   Take 25 mg by mouth 2 (two) times daily with a meal.         . carvedilol (COREG) 3.125 MG tablet   Oral   Take 8 tablets (25 mg total) by mouth 2 (two) times daily. Patient taking differently: Take 3.125 mg by mouth 2 (two) times daily.    60 tablet   0   . cetirizine (ZYRTEC) 10 MG tablet   Oral   Take 10 mg by mouth daily.         . Cholecalciferol (VITAMIN D3) 400  UNITS tablet   Oral   Take 400 Units by mouth daily.         . citalopram (CELEXA) 20 MG tablet   Oral   Take 20 mg by mouth daily.         . clopidogrel (PLAVIX) 75 MG tablet   Oral   Take 75 mg by mouth daily.         Marland Kitchen glucose 4 GM chewable tablet   Oral   Chew 3-4 tablets by mouth as needed for low blood sugar (if pts blood sugar is less than 60).         Marland Kitchen HYDROcodone-acetaminophen (NORCO/VICODIN) 5-325 MG tablet   Oral   Take 1  tablet by mouth every 6 (six) hours as needed for severe pain.   6 tablet   0   . hydrocortisone (ANUSOL-HC) 25 MG suppository   Rectal   Place 25 mg rectally 2 (two) times daily.         . lipase/protease/amylase (CREON) 12000 UNITS CPEP capsule   Oral   Take 12,000 Units by mouth 3 (three) times daily with meals.          . metoCLOPramide (REGLAN) 5 MG tablet   Oral   Take 1 tablet (5 mg total) by mouth 4 (four) times daily -  before meals and at bedtime.   40 tablet   0   . nitroGLYCERIN (NITROSTAT) 0.4 MG SL tablet   Sublingual   Place 0.4 mg under the tongue every 5 (five) minutes x 3 doses as needed for chest pain. *Max 3 doses per episode*         . pregabalin (LYRICA) 25 MG capsule   Oral   Take 25 mg by mouth 2 (two) times daily as needed (for leg pain).         . promethazine (PHENERGAN) 12.5 MG tablet      1-2 tablets by mouth every 8 hours as needed for nausea and vomiting   20 tablet   0   . promethazine (PHENERGAN) 25 MG suppository   Rectal   Place 1 suppository (25 mg total) rectally every 6 (six) hours as needed for nausea.   12 suppository   1   . ranitidine (ZANTAC) 150 MG tablet   Oral   Take 150 mg by mouth daily.          . sevelamer carbonate (RENVELA) 800 MG tablet   Oral   Take 1,600 mg by mouth 3 (three) times daily with meals.          . vitamin B-12 (CYANOCOBALAMIN) 1000 MCG tablet   Oral   Take 1,000 mcg by mouth daily.         Marland Kitchen amLODipine (NORVASC) 10 MG tablet    Oral   Take 1 tablet by mouth daily.           Allergies:  Compazine; Ace inhibitors; Ativan; Codeine; Gabapentin; Losartan; Ondansetron; Prochlorperazine; Scopolamine; Zofran; Oxycodone; and Tape  Family History: Family History  Problem Relation Age of Onset  . Kidney disease Mother   . Diabetes Mother     Social History: Social History  Substance Use Topics  . Smoking status: Current Some Day Smoker -- 0.50 packs/day    Types: Cigarettes    Last Attempt to Quit: 02/13/2015  . Smokeless tobacco: Never Used  . Alcohol Use: No     Review of Systems:   10 point review of systems was performed and was otherwise negative:  Constitutional: No fever Eyes: No visual disturbances ENT: No sore throat, ear pain Cardiac: No chest pain Respiratory: No shortness of breath, wheezing, or stridor Abdomen: No abdominal pain, no vomiting, No diarrhea Endocrine: No weight loss, No night sweats Extremities: No peripheral edema, cyanosis Skin: No rashes, easy bruising Neurologic: No focal weakness, trouble with speech or swollowing Urologic: No dysuria, Hematuria, or urinary frequency   Physical Exam:  ED Triage Vitals  Enc Vitals Group     BP 10/25/15 0807 179/75 mmHg     Pulse Rate 10/25/15 0804 72     Resp 10/25/15 0804 20     Temp 10/25/15 0803 97.5 F (36.4 C)     Temp  Source 10/25/15 0803 Oral     SpO2 10/25/15 0804 100 %     Weight 10/25/15 0803 151 lb 12.8 oz (68.856 kg)     Height 10/25/15 0803 5\' 5"  (1.651 m)     Head Cir --      Peak Flow --      Pain Score 10/25/15 0805 8     Pain Loc --      Pain Edu? --      Excl. in [place]? --     General: Awake , Alert , and Oriented times 3; GCS 15 Head: Normal cephalic , atraumatic Eyes: Pupils equal , round, reactive to light Nose/Throat: No nasal drainage, patent upper airway without erythema or exudate.  Neck: Supple, Full range of motion, No anterior adenopathy or palpable thyroid masses Lungs: Clear to ascultation  without wheezes , rhonchi, or rales Heart: Regular rate, regular rhythm without murmurs , gallops , or rubs Abdomen: Reproducible pain over the epigastric area without any rebound, guarding, or rigidity. No abdominal distention.      Extremities: 2 plus symmetric pulses. No edema, clubbing or cyanosis Neurologic: normal ambulation, Motor symmetric without deficits, sensory intact Skin: warm, dry, no rashes   Labs:   All laboratory work was reviewed including any pertinent negatives or positives listed below:  Labs Reviewed  LIPASE, BLOOD  COMPREHENSIVE METABOLIC PANEL  CBC  URINALYSIS COMPLETEWITH MICROSCOPIC ([place])  [place], ED   reviewed the patient's laboratory work shows findings consistent with her history of dialysis, etc.  EKG: ED ECG REPORT I, Daymon Larsen, the attending physician, personally viewed and interpreted this ECG.  Date: 10/25/2015 EKG Time: 0806 Rate: 77 Rhythm: normal sinus rhythm QRS Axis: Right axis deviation Intervals: normal ST/T Wave abnormalities: Nonspecific ST-T wave abnormalities Conduction Disturbances: none Narrative Interpretation: unremarkable No acute ischemic change   EXAM: CHEST 2 VIEW  COMPARISON: 10/22/2015  FINDINGS: Increased interstitial markings. Small bilateral pleural effusions. Associated mild bilateral lower lobe opacities, likely atelectasis. No pneumothorax.  Cardiomegaly.  Left axillary and subclavian stent.  IMPRESSION: Cardiomegaly with mild interstitial edema and small bilateral pleural effusions, unchanged.  Associated mild bilateral lower lobe opacities, likely atelectasis.  Radiology:       I personally reviewed the radiologic studies     ED Course:  Patient's stay here was uneventful and she had gradual symptomatic improvement with IV Reglan and IV Dilaudid. The patient's able tolerate oral fluids and felt comfortable with outpatient management. She states she didn't have any  Phenergan suppositories at home so that bad to her prescription and also wrote her for Ultram for generalized abdominal pain and cramping that goes with her cyclic vomiting syndrome. We felt this was unlikely to be a bowel obstruction, etc. Patient's had visits in the past for cyclic vomiting and similar circumstances. She is currently afebrile.    Assessment:  Cyclic vomiting syndrome Diabetes Renal failure*     Plan: * Outpatient management Patient was advised to return immediately if condition worsens. Patient was advised to follow up with their primary care physician or other specialized physicians involved in their outpatient care            Daymon Larsen, MD 10/25/15 1424

## 2015-11-09 ENCOUNTER — Encounter: Payer: Self-pay | Admitting: Emergency Medicine

## 2015-11-09 ENCOUNTER — Emergency Department
Admission: EM | Admit: 2015-11-09 | Discharge: 2015-11-09 | Disposition: A | Discharge status: Home / Self Care | Payer: Medicare Other | Attending: Emergency Medicine | Admitting: Emergency Medicine

## 2015-11-09 DIAGNOSIS — N186 End stage renal disease: Secondary | ICD-10-CM

## 2015-11-09 DIAGNOSIS — Z992 Dependence on renal dialysis: Secondary | ICD-10-CM | POA: Diagnosis not present

## 2015-11-09 DIAGNOSIS — I120 Hypertensive chronic kidney disease with stage 5 chronic kidney disease or end stage renal disease: Secondary | ICD-10-CM | POA: Insufficient documentation

## 2015-11-09 DIAGNOSIS — R109 Unspecified abdominal pain: Secondary | ICD-10-CM | POA: Diagnosis present

## 2015-11-09 DIAGNOSIS — E1140 Type 2 diabetes mellitus with diabetic neuropathy, unspecified: Secondary | ICD-10-CM | POA: Diagnosis not present

## 2015-11-09 DIAGNOSIS — G43A0 Cyclical vomiting, not intractable: Secondary | ICD-10-CM | POA: Insufficient documentation

## 2015-11-09 DIAGNOSIS — R1115 Cyclical vomiting syndrome unrelated to migraine: Secondary | ICD-10-CM

## 2015-11-09 DIAGNOSIS — G8929 Other chronic pain: Secondary | ICD-10-CM | POA: Insufficient documentation

## 2015-11-09 DIAGNOSIS — F17210 Nicotine dependence, cigarettes, uncomplicated: Secondary | ICD-10-CM | POA: Diagnosis not present

## 2015-11-09 DIAGNOSIS — E1143 Type 2 diabetes mellitus with diabetic autonomic (poly)neuropathy: Secondary | ICD-10-CM | POA: Diagnosis not present

## 2015-11-09 LAB — CBC
HCT: 38.3 % (ref 35.0–47.0)
Hemoglobin: 12.1 g/dL (ref 12.0–16.0)
MCH: 27.3 pg (ref 26.0–34.0)
MCHC: 31.6 g/dL — ABNORMAL LOW (ref 32.0–36.0)
MCV: 86.4 fL (ref 80.0–100.0)
Platelets: 190 10*3/uL (ref 150–440)
RBC: 4.43 MIL/uL (ref 3.80–5.20)
RDW: 21.2 % — ABNORMAL HIGH (ref 11.5–14.5)
WBC: 5.2 10*3/uL (ref 3.6–11.0)

## 2015-11-09 LAB — COMPREHENSIVE METABOLIC PANEL
ALT: 12 U/L — ABNORMAL LOW (ref 14–54)
AST: 32 U/L (ref 15–41)
Albumin: 3.1 g/dL — ABNORMAL LOW (ref 3.5–5.0)
Alkaline Phosphatase: 121 U/L (ref 38–126)
Anion gap: 10 (ref 5–15)
BUN: 19 mg/dL (ref 6–20)
CO2: 30 mmol/L (ref 22–32)
Calcium: 8.4 mg/dL — ABNORMAL LOW (ref 8.9–10.3)
Chloride: 99 mmol/L — ABNORMAL LOW (ref 101–111)
Creatinine, Ser: 3.02 mg/dL — ABNORMAL HIGH (ref 0.44–1.00)
GFR calc Af Amer: 19 mL/min — ABNORMAL LOW (ref >60)
GFR calc non Af Amer: 16 mL/min — ABNORMAL LOW (ref >60)
Glucose, Bld: 36 mg/dL — CL (ref 65–99)
Potassium: 4.6 mmol/L (ref 3.5–5.1)
Sodium: 139 mmol/L (ref 135–145)
Total Bilirubin: 0.7 mg/dL (ref 0.3–1.2)
Total Protein: 7.1 g/dL (ref 6.5–8.1)

## 2015-11-09 LAB — LIPASE, BLOOD: Lipase: 18 U/L (ref 11–51)

## 2015-11-09 MED ORDER — HYDROMORPHONE HCL 1 MG/ML IJ SOLN: 1.0000 mg | Freq: Once | INTRAMUSCULAR | Status: DC

## 2015-11-09 MED ORDER — PROMETHAZINE HCL 25 MG/ML IJ SOLN
25.0000 mg | Freq: Once | INTRAMUSCULAR | Status: AC
  Administered 2015-11-09: 25 mg via INTRAVENOUS
  Filled 2015-11-09: qty 1

## 2015-11-09 MED ORDER — PROMETHAZINE HCL 25 MG PO TABS: 25.0000 mg | ORAL_TABLET | Freq: Four times a day (QID) | ORAL | Status: DC | PRN

## 2015-11-09 MED ORDER — PROMETHAZINE HCL 25 MG/ML IJ SOLN: 25.0000 mg | Freq: Once | INTRAMUSCULAR | Status: DC

## 2015-11-09 MED ORDER — HYDROMORPHONE HCL 1 MG/ML IJ SOLN
1.0000 mg | Freq: Once | INTRAMUSCULAR | Status: AC
  Administered 2015-11-09: 1 mg via INTRAVENOUS
  Filled 2015-11-09: qty 1

## 2015-11-09 NOTE — ED Provider Notes (Signed)
I was contacted by the charge nurse regarding critical lab value of glucose 36 reported at about 3:45 PM. Patient had been discharged home previously. Charge nurse did call the patient and she stated that she was doing well and had nausea medicine at home. She does not take any medications either by mouth or insulin for diabetes. She does have point to check blood sugar because she had been on medication for diabetes in the past, several months ago. She checked the blood sugar while the charge nurse on the phone with her, her blood sugar was 68. She had been able to drink milk and eat something at home. Patient was instructed to test her blood sugar if she is feeling lightheaded, weak, confused, or with certainly any altered mental status, and to come to the ER if she had low blood sugar or any of these concerning symptoms.    Lisa Roca, MD 11/09/15 [phone]

## 2015-11-09 NOTE — Discharge Instructions (Signed)
Chronic Kidney Disease Chronic kidney disease occurs when the kidneys are damaged over a long period. The kidneys are two organs that lie on either side of the spine between the middle of the back and the front of the abdomen. The kidneys:  Remove wastes and extra water from the blood.  Produce important hormones. These help keep bones strong, regulate blood pressure, and help create red blood cells.  Balance the fluids and chemicals in the blood and tissues. A small amount of kidney damage may not cause problems, but a large amount of damage may make it difficult or impossible for the kidneys to work the way they should. If steps are not taken to slow down the kidney damage or stop it from getting worse, the kidneys may stop working permanently. Most of the time, chronic kidney disease does not go away. However, it can often be controlled, and those with the disease can usually live normal lives. CAUSES The most common causes of chronic kidney disease are diabetes and high blood pressure (hypertension). Chronic kidney disease may also be caused by:  Diseases that cause the kidneys' filters to become inflamed.  Diseases that affect the immune system.  Genetic diseases.  Medicines that damage the kidneys, such as anti-inflammatory medicines.  Poisoning or exposure to toxic substances.  A reoccurring kidney or urinary infection.  A problem with urine flow. This may be caused by:  Cancer.  Kidney stones.  An enlarged prostate in males. SIGNS AND SYMPTOMS Because the kidney damage in chronic kidney disease occurs slowly, symptoms develop slowly and may not be obvious until the kidney damage becomes severe. A person may have a kidney disease for years without showing any symptoms. Symptoms can include:  Swelling (edema) of the legs, ankles, or feet.  Tiredness (lethargy).  Nausea or vomiting.  Confusion.  Problems with urination, such as:  Decreased urine  production.  Frequent urination, especially at night.  Frequent accidents in children who are potty trained.  Muscle twitches and cramps.  Shortness of breath.  Weakness.  Persistent itchiness.  Loss of appetite.  Metallic taste in the mouth.  Trouble sleeping.  Slowed development in children.  Short stature in children. DIAGNOSIS Chronic kidney disease may be detected and diagnosed by tests, including blood, urine, imaging, or kidney biopsy tests. TREATMENT Most chronic kidney diseases cannot be cured. Treatment usually involves relieving symptoms and preventing or slowing the progression of the disease. Treatment may include:  A special diet. You may need to avoid alcohol and foods thatare salty and high in potassium.  Medicines. These may:  Lower blood pressure.  Relieve anemia.  Relieve swelling.  Protect the bones. HOME CARE INSTRUCTIONS  Follow your prescribed diet. Your health care provider may instruct you to limit daily salt (sodium) and protein intake.  Take medicines only as directed by your health care provider. Do not take any new medicines (prescription, over-the-counter, or nutritional supplements) unless approved by your health care provider. Many medicines can worsen your kidney damage or need to have the dose adjusted.   Quit smoking if you smoke. Talk to your health care provider about a smoking cessation program.  Keep all follow-up visits as directed by your health care provider.  Monitor your blood pressure.  Start or continue an exercise plan.  Get immunizations as directed by your health care provider.  Take vitamin and mineral supplements as directed by your health care provider. SEEK IMMEDIATE MEDICAL CARE IF:  Your symptoms get worse or you develop  new symptoms.  You develop symptoms of end-stage kidney disease. These include:  Headaches.  Abnormally dark or light skin.  Numbness in the hands or feet.  Easy  bruising.  Frequent hiccups.  Menstruation stops.  You have a fever.  You have decreased urine production.  You havepain or bleeding when urinating. MAKE SURE YOU:  Understand these instructions.  Will watch your condition.  Will get help right away if you are not doing well or get worse. FOR MORE INFORMATION   American Association of Kidney Patients: BombTimer.gl  National Kidney Foundation: www.kidney.[place]: https://mathis.com/  [employer] [facility]: www.lifeoptions.org and www.kidneyschool.org   This information is not intended to replace advice given to you by your health care provider. Make sure you discuss any questions you have with your health care provider.   Document Released: 06/17/2008 Document Revised: 09/29/2014 Document Reviewed: 05/07/2012 Elsevier Interactive Patient Education 2016 Elsevier Inc. Nausea and Vomiting Nausea is a sick feeling that often comes before throwing up (vomiting). Vomiting is a reflex where stomach contents come out of your mouth. Vomiting can cause severe loss of body fluids (dehydration). Children and elderly adults can become dehydrated quickly, especially if they also have diarrhea. Nausea and vomiting are symptoms of a condition or disease. It is important to find the cause of your symptoms. CAUSES   Direct irritation of the stomach lining. This irritation can result from increased acid production (gastroesophageal reflux disease), infection, food poisoning, taking certain medicines (such as nonsteroidal anti-inflammatory drugs), alcohol use, or tobacco use.  Signals from the brain.These signals could be caused by a headache, heat exposure, an inner ear disturbance, increased pressure in the brain from injury, infection, a tumor, or a concussion, pain, emotional stimulus, or metabolic problems.  An obstruction in the gastrointestinal tract (bowel obstruction).  Illnesses such as diabetes,  hepatitis, gallbladder problems, appendicitis, kidney problems, cancer, sepsis, atypical symptoms of a heart attack, or eating disorders.  Medical treatments such as chemotherapy and radiation.  Receiving medicine that makes you sleep (general anesthetic) during surgery. DIAGNOSIS Your caregiver may ask for tests to be done if the problems do not improve after a few days. Tests may also be done if symptoms are severe or if the reason for the nausea and vomiting is not clear. Tests may include:  Urine tests.  Blood tests.  Stool tests.  Cultures (to look for evidence of infection).  X-rays or other imaging studies. Test results can help your caregiver make decisions about treatment or the need for additional tests. TREATMENT You need to stay well hydrated. Drink frequently but in small amounts.You may wish to drink water, sports drinks, clear broth, or eat frozen ice pops or gelatin dessert to help stay hydrated.When you eat, eating slowly may help prevent nausea.There are also some antinausea medicines that may help prevent nausea. HOME CARE INSTRUCTIONS   Take all medicine as directed by your caregiver.  If you do not have an appetite, do not force yourself to eat. However, you must continue to drink fluids.  If you have an appetite, eat a normal diet unless your caregiver tells you differently.  Eat a variety of complex carbohydrates (rice, wheat, potatoes, bread), lean meats, yogurt, fruits, and vegetables.  Avoid high-fat foods because they are more difficult to digest.  Drink enough water and fluids to keep your urine clear or pale yellow.  If you are dehydrated, ask your caregiver for specific rehydration instructions. Signs of dehydration may include:  Severe  thirst.  Dry lips and mouth.  Dizziness.  Dark urine.  Decreasing urine frequency and amount.  Confusion.  Rapid breathing or pulse. SEEK IMMEDIATE MEDICAL CARE IF:   You have blood or brown flecks  (like coffee grounds) in your vomit.  You have black or bloody stools.  You have a severe headache or stiff neck.  You are confused.  You have severe abdominal pain.  You have chest pain or trouble breathing.  You do not urinate at least once every 8 hours.  You develop cold or clammy skin.  You continue to vomit for longer than 24 to 48 hours.  You have a fever. MAKE SURE YOU:   Understand these instructions.  Will watch your condition.  Will get help right away if you are not doing well or get worse.   This information is not intended to replace advice given to you by your health care provider. Make sure you discuss any questions you have with your health care provider.   Document Released: 09/08/2005 Document Revised: 12/01/2011 Document Reviewed: 02/05/2011 Elsevier Interactive Patient Education [employer].

## 2015-11-09 NOTE — ED Notes (Signed)
Pt arrived via EMS from [facility]. Pt began having N/V approximately 0530 this morning. Went to have dialysis where they were able to complete approximately 75% of her treatment. Approximately an hour ago, pt began to have RUQ abd pain that is throbbing in nature.

## 2015-11-09 NOTE — ED Notes (Signed)
Pt discharged home after verbalizing understanding of discharge instructions; nad noted. 

## 2015-11-09 NOTE — ED Provider Notes (Signed)
[facility] Emergency Department Provider Note     Time seen: ----------------------------------------- 11:02 AM on 11/09/2015 -----------------------------------------    I have reviewed the triage vital signs and the nursing notes.   HISTORY  Chief Complaint Abdominal Pain    HPI [name] is a 55 y.o. female who presents to ER with severe nausea vomiting and abdominal pain since 5:30 this morning. Patient went to have dialysis and they were able to complete about 75% of it. Approximately one hour ago she began having right upper quadrant pain is throbbing in nature. Patient states she gets sick at almost every dialysis event. Currently complaining of abdominal pain and nausea. She denies fevers chills or other complaints.   Past Medical History  Diagnosis Date  . Asthma   . Collagen vascular disease ([place])   . Diabetes mellitus without complication ([place])   . Hypertension   . Coronary artery disease     a. cath 2013: stenting to RCA (report not available); b. cath 2014: LM nl, pLAD 40%, mLAD nl, ost LCx 40%, mid LCx nl, pRCA 30% @ site of prior stent, mRCA 50%  . Diabetic neuropathy ([place])   . ESRD (end stage renal disease) on dialysis ([place])     M-W-F  . GERD (gastroesophageal reflux disease)   . Hx of pancreatitis 2015  . Myocardial infarction ([place])   . Pneumonia   . Mitral regurgitation     a. echo 10/2013: EF 62%, noWMA, mildly dilated LA, mild to mod MR/TR, GR1DD  . COPD (chronic obstructive pulmonary disease) ([place])   . chronic diastolic CHF 123XX123  . dialysis 2006  . Depression   . Anxiety   . Arthritis   . Headache   . Anemia   . Renal insufficiency     Patient Active Problem List   Diagnosis Date Noted  . Hyperkalemia 10/03/2015  . C. difficile diarrhea 07/22/2015  . Pneumonia 05/21/2015  . Hypoglycemia 04/24/2015  . Unresponsiveness 04/24/2015  . Bradycardia 04/24/2015  . Hypothermia 04/24/2015  . Acute respiratory  failure ([place]) 04/24/2015  . Acute diastolic CHF (congestive heart failure) ([place]) 04/05/2015  . Diabetic gastroparesis ([place]) 04/05/2015  . Hypokalemia 04/05/2015  . Generalized weakness 04/05/2015  . Acute pulmonary edema ([place]) 04/03/2015  . Nausea and vomiting 04/03/2015  . Hypoglycemia associated with diabetes ([place]) 04/03/2015  . Anemia of chronic disease 04/03/2015  . Secondary hyperparathyroidism ([place]) 04/03/2015  . Pressure ulcer 04/02/2015  . Acute respiratory failure with hypoxia ([place]) 04/01/2015  . Adjustment disorder with anxiety 03/14/2015  . Somatic symptom disorder, mild 03/08/2015  . Coronary artery disease involving native coronary artery of native heart without angina pectoris   . Nausea & vomiting 03/06/2015  . Abdominal pain 03/06/2015  . DM (diabetes mellitus) ([place]) 03/06/2015  . HTN (hypertension) 03/06/2015  . Gastroparesis 02/24/2015  . Pleural effusion 02/19/2015  . HCAP (healthcare-associated pneumonia) 02/19/2015  . End-stage renal disease on hemodialysis ([place]) 02/19/2015    Past Surgical History  Procedure Laterality Date  . Cholecystectomy    . Appendectomy    . Abdominal hysterectomy    . Dialysis fistula creation Left     upper arm  . Esophagogastroduodenoscopy N/A 03/08/2015    Procedure: ESOPHAGOGASTRODUODENOSCOPY (EGD);  Surgeon: Manya Silvas, MD;  Location[facility] ENDOSCOPY;  Service: Endoscopy;  Laterality: N/A;  . Cardiac catheterization Left 07/26/2015    Procedure: Left Heart Cath and Coronary Angiography;  Surgeon: Dionisio David, MD;  Location: [place] CV LAB;  Service: Cardiovascular;  Laterality: Left;  Marland Kitchen Eye surgery    . Fecal transplant N/A 08/23/2015    Procedure: FECAL TRANSPLANT;  Surgeon: Manya Silvas, MD;  Location: [facility] ENDOSCOPY;  Service: Endoscopy;  Laterality: N/A;    Allergies Compazine; Ace inhibitors; Ativan; Codeine; Gabapentin; Losartan; Ondansetron; Prochlorperazine; Scopolamine; Zofran; Oxycodone; and  Tape  Social History Social History  Substance Use Topics  . Smoking status: Current Some Day Smoker -- 0.50 packs/day    Types: Cigarettes    Last Attempt to Quit: 02/13/2015  . Smokeless tobacco: Never Used  . Alcohol Use: No    Review of Systems Constitutional: Negative for fever. Eyes: Negative for visual changes. ENT: Negative for sore throat. Cardiovascular: Negative for chest pain. Respiratory: Negative for shortness of breath. Gastrointestinal: Positive for abdominal pain and vomiting Genitourinary: Negative for dysuria. Musculoskeletal: Negative for back pain. Skin: Negative for rash. Neurological: Negative for headaches, focal weakness or numbness.  10-point ROS otherwise negative.  ____________________________________________   PHYSICAL EXAM:  VITAL SIGNS: ED Triage Vitals  Enc Vitals Group     BP 11/09/15 1037 144/61 mmHg     Pulse Rate 11/09/15 1037 58     Resp --      Temp 11/09/15 1037 97.7 F (36.5 C)     Temp Source 11/09/15 1037 Oral     SpO2 11/09/15 1037 100 %     Weight 11/09/15 1037 153 lb (69.4 kg)     Height 11/09/15 1037 5\' 5"  (1.651 m)     Head Cir --      Peak Flow --      Pain Score 11/09/15 1039 8     Pain Loc --      Pain Edu? --      Excl. in [place]? --     Constitutional: Alert and oriented. Well appearing and in no distress. Eyes: Conjunctivae are normal. PERRL. Normal extraocular movements. ENT   Head: Normocephalic and atraumatic.   Nose: No congestion/rhinnorhea.   Mouth/Throat: Mucous membranes are moist.   Neck: No stridor. Cardiovascular: Normal rate, regular rhythm. Normal and symmetric distal pulses are present in all extremities. No murmurs, rubs, or gallops. Respiratory: Normal respiratory effort without tachypnea nor retractions. Breath sounds are clear and equal bilaterally. No wheezes/rales/rhonchi. Gastrointestinal: Nonfocal tenderness, normal bowel sounds. Musculoskeletal: Nontender with normal range  of motion in all extremities. No joint effusions.  No lower extremity tenderness nor edema. Neurologic:  Normal speech and language. No gross focal neurologic deficits are appreciated.  Skin:  Skin is warm, dry and intact. No rash noted. Psychiatric: Mood and affect are normal. Speech and behavior are normal. Patient exhibits appropriate insight and judgment. ____________________________________________  EKG: Interpreted by me. Normal sinus rhythm with a rate of 59 bpm, normal PR interval, normal QRS, normal QT interval. Normal axis. Anterior septal infarct, diffuse ST and T-wave flattening  ____________________________________________  ED COURSE:  Pertinent labs & imaging results that were available during my care of the patient were reviewed by me and considered in my medical decision making (see chart for details). Patient is well-known to the ER, will receive IM medication and she has very poor access. ____________________________________________    LABS (pertinent positives/negatives)  Labs Reviewed  CBC - Abnormal; Notable for the following:    MCHC 31.6 (*)    RDW 21.2 (*)    All other components within normal limits  ____________________________________________  FINAL ASSESSMENT AND PLAN  Chronic abdominal pain, chronic vomiting  Plan: Patient with labs  as dictated above. Patient is feeling somewhat better after her medications. Again this is an acute on chronic problem, will be prescribed antiemetics to take at home.   Earleen Newport, MD   Earleen Newport, MD 11/09/15 [phone]

## 2015-11-12 ENCOUNTER — Encounter: Payer: Self-pay | Admitting: Emergency Medicine

## 2015-11-12 ENCOUNTER — Inpatient Hospital Stay
Admission: EM | Admit: 2015-11-12 | Discharge: 2015-12-01 | DRG: 870 | Disposition: A | Discharge status: Skilled Nursing Facility | Payer: Medicare Other | Attending: Internal Medicine | Admitting: Internal Medicine

## 2015-11-12 ENCOUNTER — Emergency Department: Payer: Medicare Other

## 2015-11-12 DIAGNOSIS — Z992 Dependence on renal dialysis: Secondary | ICD-10-CM

## 2015-11-12 DIAGNOSIS — F419 Anxiety disorder, unspecified: Secondary | ICD-10-CM | POA: Diagnosis present

## 2015-11-12 DIAGNOSIS — J154 Pneumonia due to other streptococci: Secondary | ICD-10-CM | POA: Diagnosis present

## 2015-11-12 DIAGNOSIS — I252 Old myocardial infarction: Secondary | ICD-10-CM

## 2015-11-12 DIAGNOSIS — R6521 Severe sepsis with septic shock: Secondary | ICD-10-CM | POA: Diagnosis not present

## 2015-11-12 DIAGNOSIS — A403 Sepsis due to Streptococcus pneumoniae: Secondary | ICD-10-CM | POA: Diagnosis not present

## 2015-11-12 DIAGNOSIS — F17210 Nicotine dependence, cigarettes, uncomplicated: Secondary | ICD-10-CM | POA: Diagnosis present

## 2015-11-12 DIAGNOSIS — Z91048 Other nonmedicinal substance allergy status: Secondary | ICD-10-CM

## 2015-11-12 DIAGNOSIS — K3184 Gastroparesis: Secondary | ICD-10-CM | POA: Diagnosis present

## 2015-11-12 DIAGNOSIS — R112 Nausea with vomiting, unspecified: Secondary | ICD-10-CM

## 2015-11-12 DIAGNOSIS — E876 Hypokalemia: Secondary | ICD-10-CM | POA: Diagnosis present

## 2015-11-12 DIAGNOSIS — Z841 Family history of disorders of kidney and ureter: Secondary | ICD-10-CM

## 2015-11-12 DIAGNOSIS — Z9889 Other specified postprocedural states: Secondary | ICD-10-CM

## 2015-11-12 DIAGNOSIS — J45909 Unspecified asthma, uncomplicated: Secondary | ICD-10-CM | POA: Diagnosis present

## 2015-11-12 DIAGNOSIS — E1165 Type 2 diabetes mellitus with hyperglycemia: Secondary | ICD-10-CM | POA: Diagnosis not present

## 2015-11-12 DIAGNOSIS — F329 Major depressive disorder, single episode, unspecified: Secondary | ICD-10-CM | POA: Diagnosis present

## 2015-11-12 DIAGNOSIS — J470 Bronchiectasis with acute lower respiratory infection: Secondary | ICD-10-CM | POA: Diagnosis present

## 2015-11-12 DIAGNOSIS — R110 Nausea: Secondary | ICD-10-CM | POA: Diagnosis present

## 2015-11-12 DIAGNOSIS — E875 Hyperkalemia: Secondary | ICD-10-CM | POA: Diagnosis not present

## 2015-11-12 DIAGNOSIS — I2510 Atherosclerotic heart disease of native coronary artery without angina pectoris: Secondary | ICD-10-CM | POA: Diagnosis present

## 2015-11-12 DIAGNOSIS — J90 Pleural effusion, not elsewhere classified: Secondary | ICD-10-CM

## 2015-11-12 DIAGNOSIS — Z7902 Long term (current) use of antithrombotics/antiplatelets: Secondary | ICD-10-CM

## 2015-11-12 DIAGNOSIS — R0602 Shortness of breath: Secondary | ICD-10-CM

## 2015-11-12 DIAGNOSIS — R1084 Generalized abdominal pain: Secondary | ICD-10-CM

## 2015-11-12 DIAGNOSIS — Z7984 Long term (current) use of oral hypoglycemic drugs: Secondary | ICD-10-CM

## 2015-11-12 DIAGNOSIS — N186 End stage renal disease: Secondary | ICD-10-CM | POA: Diagnosis present

## 2015-11-12 DIAGNOSIS — N2581 Secondary hyperparathyroidism of renal origin: Secondary | ICD-10-CM | POA: Diagnosis present

## 2015-11-12 DIAGNOSIS — K861 Other chronic pancreatitis: Secondary | ICD-10-CM | POA: Diagnosis present

## 2015-11-12 DIAGNOSIS — R0600 Dyspnea, unspecified: Secondary | ICD-10-CM | POA: Insufficient documentation

## 2015-11-12 DIAGNOSIS — R509 Fever, unspecified: Secondary | ICD-10-CM | POA: Diagnosis present

## 2015-11-12 DIAGNOSIS — I132 Hypertensive heart and chronic kidney disease with heart failure and with stage 5 chronic kidney disease, or end stage renal disease: Secondary | ICD-10-CM | POA: Diagnosis present

## 2015-11-12 DIAGNOSIS — K219 Gastro-esophageal reflux disease without esophagitis: Secondary | ICD-10-CM | POA: Diagnosis present

## 2015-11-12 DIAGNOSIS — E11649 Type 2 diabetes mellitus with hypoglycemia without coma: Secondary | ICD-10-CM | POA: Diagnosis present

## 2015-11-12 DIAGNOSIS — Z833 Family history of diabetes mellitus: Secondary | ICD-10-CM

## 2015-11-12 DIAGNOSIS — E871 Hypo-osmolality and hyponatremia: Secondary | ICD-10-CM | POA: Diagnosis present

## 2015-11-12 DIAGNOSIS — A047 Enterocolitis due to Clostridium difficile: Secondary | ICD-10-CM | POA: Diagnosis present

## 2015-11-12 DIAGNOSIS — J9600 Acute respiratory failure, unspecified whether with hypoxia or hypercapnia: Secondary | ICD-10-CM

## 2015-11-12 DIAGNOSIS — D631 Anemia in chronic kidney disease: Secondary | ICD-10-CM | POA: Diagnosis present

## 2015-11-12 DIAGNOSIS — Z4659 Encounter for fitting and adjustment of other gastrointestinal appliance and device: Secondary | ICD-10-CM

## 2015-11-12 DIAGNOSIS — G9341 Metabolic encephalopathy: Secondary | ICD-10-CM | POA: Diagnosis not present

## 2015-11-12 DIAGNOSIS — Z79899 Other long term (current) drug therapy: Secondary | ICD-10-CM

## 2015-11-12 DIAGNOSIS — R609 Edema, unspecified: Secondary | ICD-10-CM

## 2015-11-12 DIAGNOSIS — I429 Cardiomyopathy, unspecified: Secondary | ICD-10-CM | POA: Diagnosis present

## 2015-11-12 DIAGNOSIS — E440 Moderate protein-calorie malnutrition: Secondary | ICD-10-CM | POA: Insufficient documentation

## 2015-11-12 DIAGNOSIS — N179 Acute kidney failure, unspecified: Secondary | ICD-10-CM | POA: Insufficient documentation

## 2015-11-12 DIAGNOSIS — E11319 Type 2 diabetes mellitus with unspecified diabetic retinopathy without macular edema: Secondary | ICD-10-CM | POA: Diagnosis present

## 2015-11-12 DIAGNOSIS — A414 Sepsis due to anaerobes: Secondary | ICD-10-CM | POA: Diagnosis present

## 2015-11-12 DIAGNOSIS — Z888 Allergy status to other drugs, medicaments and biological substances status: Secondary | ICD-10-CM

## 2015-11-12 DIAGNOSIS — R1310 Dysphagia, unspecified: Secondary | ICD-10-CM | POA: Diagnosis present

## 2015-11-12 DIAGNOSIS — Z452 Encounter for adjustment and management of vascular access device: Secondary | ICD-10-CM | POA: Insufficient documentation

## 2015-11-12 DIAGNOSIS — E1122 Type 2 diabetes mellitus with diabetic chronic kidney disease: Secondary | ICD-10-CM | POA: Diagnosis present

## 2015-11-12 DIAGNOSIS — E1143 Type 2 diabetes mellitus with diabetic autonomic (poly)neuropathy: Secondary | ICD-10-CM | POA: Diagnosis present

## 2015-11-12 DIAGNOSIS — D696 Thrombocytopenia, unspecified: Secondary | ICD-10-CM | POA: Diagnosis present

## 2015-11-12 DIAGNOSIS — J9601 Acute respiratory failure with hypoxia: Secondary | ICD-10-CM | POA: Diagnosis not present

## 2015-11-12 DIAGNOSIS — I959 Hypotension, unspecified: Secondary | ICD-10-CM | POA: Diagnosis present

## 2015-11-12 DIAGNOSIS — H6690 Otitis media, unspecified, unspecified ear: Secondary | ICD-10-CM | POA: Diagnosis present

## 2015-11-12 DIAGNOSIS — L89152 Pressure ulcer of sacral region, stage 2: Secondary | ICD-10-CM | POA: Diagnosis not present

## 2015-11-12 DIAGNOSIS — Z885 Allergy status to narcotic agent status: Secondary | ICD-10-CM

## 2015-11-12 DIAGNOSIS — N2889 Other specified disorders of kidney and ureter: Secondary | ICD-10-CM | POA: Insufficient documentation

## 2015-11-12 DIAGNOSIS — M1990 Unspecified osteoarthritis, unspecified site: Secondary | ICD-10-CM | POA: Diagnosis present

## 2015-11-12 DIAGNOSIS — G43A0 Cyclical vomiting, not intractable: Secondary | ICD-10-CM | POA: Diagnosis present

## 2015-11-12 DIAGNOSIS — L8990 Pressure ulcer of unspecified site, unspecified stage: Secondary | ICD-10-CM | POA: Diagnosis present

## 2015-11-12 DIAGNOSIS — J9690 Respiratory failure, unspecified, unspecified whether with hypoxia or hypercapnia: Secondary | ICD-10-CM | POA: Insufficient documentation

## 2015-11-12 DIAGNOSIS — J811 Chronic pulmonary edema: Secondary | ICD-10-CM

## 2015-11-12 DIAGNOSIS — I5043 Acute on chronic combined systolic (congestive) and diastolic (congestive) heart failure: Secondary | ICD-10-CM | POA: Diagnosis present

## 2015-11-12 DIAGNOSIS — E785 Hyperlipidemia, unspecified: Secondary | ICD-10-CM | POA: Diagnosis present

## 2015-11-12 DIAGNOSIS — R1110 Vomiting, unspecified: Secondary | ICD-10-CM

## 2015-11-12 LAB — CBC
HCT: 38.1 % (ref 35.0–47.0)
Hemoglobin: 12.2 g/dL (ref 12.0–16.0)
MCH: 27.9 pg (ref 26.0–34.0)
MCHC: 31.9 g/dL — ABNORMAL LOW (ref 32.0–36.0)
MCV: 87.3 fL (ref 80.0–100.0)
Platelets: 160 10*3/uL (ref 150–440)
RBC: 4.37 MIL/uL (ref 3.80–5.20)
RDW: 21.1 % — ABNORMAL HIGH (ref 11.5–14.5)
WBC: 8.7 10*3/uL (ref 3.6–11.0)

## 2015-11-12 LAB — TROPONIN I: Troponin I: <0.03 ng/mL (ref <0.031)

## 2015-11-12 LAB — GLUCOSE, CAPILLARY
Glucose-Capillary: 101 mg/dL — ABNORMAL HIGH (ref 65–99)
Glucose-Capillary: 103 mg/dL — ABNORMAL HIGH (ref 65–99)
Glucose-Capillary: 137 mg/dL — ABNORMAL HIGH (ref 65–99)
Glucose-Capillary: 40 mg/dL — CL (ref 65–99)
Glucose-Capillary: 40 mg/dL — CL (ref 65–99)
Glucose-Capillary: 67 mg/dL (ref 65–99)
Glucose-Capillary: 72 mg/dL (ref 65–99)
Glucose-Capillary: <10 mg/dL — CL (ref 65–99)
Glucose-Capillary: <10 mg/dL — CL (ref 65–99)

## 2015-11-12 LAB — COMPREHENSIVE METABOLIC PANEL
ALT: 12 U/L — ABNORMAL LOW (ref 14–54)
AST: 18 U/L (ref 15–41)
Albumin: 3.2 g/dL — ABNORMAL LOW (ref 3.5–5.0)
Alkaline Phosphatase: 125 U/L (ref 38–126)
Anion gap: 9 (ref 5–15)
BUN: 38 mg/dL — ABNORMAL HIGH (ref 6–20)
CO2: 30 mmol/L (ref 22–32)
Calcium: 9.4 mg/dL (ref 8.9–10.3)
Chloride: 95 mmol/L — ABNORMAL LOW (ref 101–111)
Creatinine, Ser: 5.19 mg/dL — ABNORMAL HIGH (ref 0.44–1.00)
GFR calc Af Amer: 10 mL/min — ABNORMAL LOW (ref >60)
GFR calc non Af Amer: 8 mL/min — ABNORMAL LOW (ref >60)
Glucose, Bld: 62 mg/dL — ABNORMAL LOW (ref 65–99)
Potassium: 5.9 mmol/L — ABNORMAL HIGH (ref 3.5–5.1)
Sodium: 134 mmol/L — ABNORMAL LOW (ref 135–145)
Total Bilirubin: 1 mg/dL (ref 0.3–1.2)
Total Protein: 7.2 g/dL (ref 6.5–8.1)

## 2015-11-12 LAB — LIPASE, BLOOD: Lipase: 14 U/L (ref 11–51)

## 2015-11-12 MED ORDER — RISAQUAD PO CAPS
1.0000 | ORAL_CAPSULE | Freq: Every day | ORAL | Status: DC
  Filled 2015-11-12: qty 1

## 2015-11-12 MED ORDER — SODIUM CHLORIDE 0.9 % IV SOLN
1.0000 g | Freq: Once | INTRAVENOUS | Status: AC
  Administered 2015-11-12: 1 g via INTRAVENOUS
  Filled 2015-11-12 (×2): qty 10

## 2015-11-12 MED ORDER — RANOLAZINE ER 500 MG PO TB12
1000.0000 mg | ORAL_TABLET | Freq: Two times a day (BID) | ORAL | Status: DC
  Administered 2015-11-12: 1000 mg via ORAL
  Filled 2015-11-12 (×3): qty 2

## 2015-11-12 MED ORDER — PHENOL 1.4 % MT LIQD
1.0000 | OROMUCOSAL | Status: DC | PRN
  Administered 2015-11-12: 1 via OROMUCOSAL
  Filled 2015-11-12: qty 177

## 2015-11-12 MED ORDER — PREGABALIN 25 MG PO CAPS: 25.0000 mg | ORAL_CAPSULE | Freq: Two times a day (BID) | ORAL | Status: DC | PRN

## 2015-11-12 MED ORDER — ACETAMINOPHEN 650 MG RE SUPP
650.0000 mg | Freq: Four times a day (QID) | RECTAL | Status: DC | PRN
Start: 2015-11-12 — End: 2015-12-01
  Administered 2015-11-13: 650 mg via RECTAL
  Filled 2015-11-12: qty 1

## 2015-11-12 MED ORDER — VITAMIN B-12 1000 MCG PO TABS
1000.0000 ug | ORAL_TABLET | Freq: Every day | ORAL | Status: DC
  Filled 2015-11-12: qty 1

## 2015-11-12 MED ORDER — ONDANSETRON HCL 4 MG PO TABS: 4.0000 mg | ORAL_TABLET | Freq: Four times a day (QID) | ORAL | Status: DC | PRN

## 2015-11-12 MED ORDER — DEXTROSE 50 % IV SOLN
12.5000 g | Freq: Once | INTRAVENOUS | Status: AC
  Administered 2015-11-12: 12.5 g via INTRAVENOUS
  Filled 2015-11-12: qty 50

## 2015-11-12 MED ORDER — ONDANSETRON HCL 4 MG/2ML IJ SOLN: 4.0000 mg | Freq: Four times a day (QID) | INTRAMUSCULAR | Status: DC | PRN

## 2015-11-12 MED ORDER — ACETAMINOPHEN 325 MG PO TABS
650.0000 mg | ORAL_TABLET | Freq: Once | ORAL | Status: AC
  Administered 2015-11-12: 650 mg via ORAL

## 2015-11-12 MED ORDER — OXYCODONE HCL 5 MG PO TABS
5.0000 mg | ORAL_TABLET | ORAL | Status: DC | PRN
  Administered 2015-11-12 – 2015-11-13 (×2): 5 mg via ORAL
  Filled 2015-11-12 (×2): qty 1

## 2015-11-12 MED ORDER — PROMETHAZINE HCL 6.25 MG/5ML PO SYRP
25.0000 mg | ORAL_SOLUTION | ORAL | Status: DC | PRN
Start: 2015-11-12 — End: 2015-11-14
  Administered 2015-11-12 – 2015-11-13 (×2): 25 mg via ORAL
  Filled 2015-11-12 (×3): qty 20

## 2015-11-12 MED ORDER — BISACODYL 5 MG PO TBEC: 5.0000 mg | DELAYED_RELEASE_TABLET | Freq: Every day | ORAL | Status: DC | PRN

## 2015-11-12 MED ORDER — PANTOPRAZOLE SODIUM 40 MG PO TBEC
40.0000 mg | DELAYED_RELEASE_TABLET | Freq: Every day | ORAL | Status: DC
  Filled 2015-11-12: qty 1

## 2015-11-12 MED ORDER — DEXTROSE 50 % IV SOLN
INTRAVENOUS | Status: AC
  Administered 2015-11-12: 50 mL via INTRAVENOUS
  Filled 2015-11-12: qty 50

## 2015-11-12 MED ORDER — DEXTROSE 5 % IV SOLN: INTRAVENOUS | Status: DC

## 2015-11-12 MED ORDER — SENNOSIDES-DOCUSATE SODIUM 8.6-50 MG PO TABS: 1.0000 | ORAL_TABLET | Freq: Every evening | ORAL | Status: DC | PRN

## 2015-11-12 MED ORDER — ALBUTEROL SULFATE (2.5 MG/3ML) 0.083% IN NEBU
2.5000 mg | INHALATION_SOLUTION | RESPIRATORY_TRACT | Status: DC | PRN
  Administered 2015-11-13 – 2015-11-24 (×3): 2.5 mg via RESPIRATORY_TRACT
  Filled 2015-11-12 (×3): qty 3

## 2015-11-12 MED ORDER — NEPRO/CARBSTEADY PO LIQD
237.0000 mL | Freq: Two times a day (BID) | ORAL | Status: DC
  Administered 2015-11-12: 237 mL via ORAL

## 2015-11-12 MED ORDER — IOHEXOL 300 MG/ML  SOLN
100.0000 mL | Freq: Once | INTRAMUSCULAR | Status: AC | PRN
  Administered 2015-11-12: 100 mL via INTRAVENOUS

## 2015-11-12 MED ORDER — DEXTROSE 50 % IV SOLN
1.0000 | Freq: Once | INTRAVENOUS | Status: AC
  Administered 2015-11-12: 50 mL via INTRAVENOUS
  Filled 2015-11-12: qty 50

## 2015-11-12 MED ORDER — AMLODIPINE BESYLATE 10 MG PO TABS
10.0000 mg | ORAL_TABLET | Freq: Every day | ORAL | Status: DC
  Filled 2015-11-12: qty 1

## 2015-11-12 MED ORDER — DEXTROSE 50 % IV SOLN
INTRAVENOUS | Status: AC
  Filled 2015-11-12: qty 50

## 2015-11-12 MED ORDER — ONDANSETRON HCL 4 MG/2ML IJ SOLN
4.0000 mg | Freq: Once | INTRAMUSCULAR | Status: AC
  Administered 2015-11-12: 4 mg via INTRAVENOUS

## 2015-11-12 MED ORDER — RENA-VITE PO TABS
1.0000 | ORAL_TABLET | Freq: Every day | ORAL | Status: DC
  Administered 2015-11-12: 1 via ORAL
  Filled 2015-11-12: qty 1

## 2015-11-12 MED ORDER — SODIUM BICARBONATE 8.4 % IV SOLN
50.0000 meq | Freq: Once | INTRAVENOUS | Status: AC
  Administered 2015-11-12: 50 meq via INTRAVENOUS
  Filled 2015-11-12: qty 50

## 2015-11-12 MED ORDER — CARVEDILOL 12.5 MG PO TABS: 25.0000 mg | ORAL_TABLET | Freq: Two times a day (BID) | ORAL | Status: DC

## 2015-11-12 MED ORDER — DEXTROSE 50 % IV SOLN
1.0000 | Freq: Once | INTRAVENOUS | Status: AC
  Administered 2015-11-12 (×2): 50 mL via INTRAVENOUS

## 2015-11-12 MED ORDER — INSULIN ASPART 100 UNIT/ML ~~LOC~~ SOLN
10.0000 [IU] | Freq: Once | SUBCUTANEOUS | Status: AC
  Administered 2015-11-12: 10 [IU] via INTRAVENOUS
  Filled 2015-11-12: qty 10

## 2015-11-12 MED ORDER — GLUCOSE 4 G PO CHEW: 3.0000 | CHEWABLE_TABLET | ORAL | Status: DC | PRN

## 2015-11-12 MED ORDER — ACETAMINOPHEN 325 MG PO TABS
650.0000 mg | ORAL_TABLET | Freq: Four times a day (QID) | ORAL | Status: DC | PRN
Start: 2015-11-12 — End: 2015-12-01
  Administered 2015-11-14 – 2015-11-30 (×5): 650 mg via ORAL
  Filled 2015-11-12 (×5): qty 2

## 2015-11-12 MED ORDER — ASPIRIN EC 81 MG PO TBEC
81.0000 mg | DELAYED_RELEASE_TABLET | Freq: Every day | ORAL | Status: DC
  Filled 2015-11-12: qty 1

## 2015-11-12 MED ORDER — DEXTROSE 10 % IV SOLN
INTRAVENOUS | Status: DC
  Administered 2015-11-12: 17:00:00 via INTRAVENOUS

## 2015-11-12 MED ORDER — SEVELAMER CARBONATE 800 MG PO TABS: 800.0000 mg | ORAL_TABLET | Freq: Three times a day (TID) | ORAL | Status: DC

## 2015-11-12 MED ORDER — CLOPIDOGREL BISULFATE 75 MG PO TABS
75.0000 mg | ORAL_TABLET | Freq: Every day | ORAL | Status: DC
  Filled 2015-11-12: qty 1

## 2015-11-12 MED ORDER — LORATADINE 10 MG PO TABS
10.0000 mg | ORAL_TABLET | Freq: Every day | ORAL | Status: DC
  Filled 2015-11-12: qty 1

## 2015-11-12 MED ORDER — PROMETHAZINE HCL 25 MG PO TABS: 25.0000 mg | ORAL_TABLET | Freq: Four times a day (QID) | ORAL | Status: DC | PRN

## 2015-11-12 MED ORDER — CITALOPRAM HYDROBROMIDE 20 MG PO TABS
20.0000 mg | ORAL_TABLET | Freq: Every day | ORAL | Status: DC
  Filled 2015-11-12: qty 1

## 2015-11-12 MED ORDER — PANCRELIPASE (LIP-PROT-AMYL) 12000-38000 UNITS PO CPEP
12000.0000 [IU] | ORAL_CAPSULE | Freq: Three times a day (TID) | ORAL | Status: DC
  Administered 2015-11-15 – 2015-12-01 (×35): 12000 [IU] via ORAL
  Filled 2015-11-12 (×36): qty 1

## 2015-11-12 MED ORDER — METOCLOPRAMIDE HCL 10 MG PO TABS
5.0000 mg | ORAL_TABLET | Freq: Three times a day (TID) | ORAL | Status: DC
  Administered 2015-11-12: 5 mg via ORAL
  Filled 2015-11-12 (×2): qty 1

## 2015-11-12 MED ORDER — HEPARIN SODIUM (PORCINE) 5000 UNIT/ML IJ SOLN
5000.0000 [IU] | Freq: Three times a day (TID) | INTRAMUSCULAR | Status: DC
  Administered 2015-11-12 – 2015-11-16 (×9): 5000 [IU] via SUBCUTANEOUS
  Filled 2015-11-12 (×9): qty 1

## 2015-11-12 MED ORDER — ACETAMINOPHEN 325 MG PO TABS
ORAL_TABLET | ORAL | Status: AC
  Administered 2015-11-12: 650 mg via ORAL
  Filled 2015-11-12: qty 2

## 2015-11-12 MED ORDER — MORPHINE SULFATE (PF) 4 MG/ML IV SOLN
4.0000 mg | Freq: Once | INTRAVENOUS | Status: AC
  Administered 2015-11-12: 4 mg via INTRAVENOUS
  Filled 2015-11-12: qty 1

## 2015-11-12 MED ORDER — CHOLECALCIFEROL 10 MCG (400 UNIT) PO TABS
400.0000 [IU] | ORAL_TABLET | Freq: Every day | ORAL | Status: DC
  Filled 2015-11-12 (×3): qty 1

## 2015-11-12 MED ORDER — CARVEDILOL 25 MG PO TABS
25.0000 mg | ORAL_TABLET | Freq: Two times a day (BID) | ORAL | Status: DC
  Filled 2015-11-12: qty 2

## 2015-11-12 MED ORDER — AMIODARONE HCL 200 MG PO TABS
400.0000 mg | ORAL_TABLET | Freq: Every day | ORAL | Status: DC
  Filled 2015-11-12: qty 2

## 2015-11-12 MED ORDER — IOHEXOL 240 MG/ML SOLN
25.0000 mL | Freq: Once | INTRAMUSCULAR | Status: DC | PRN
Start: 2015-11-12 — End: 2015-12-01

## 2015-11-12 MED ORDER — ATORVASTATIN CALCIUM 20 MG PO TABS: 20.0000 mg | ORAL_TABLET | Freq: Every evening | ORAL | Status: DC

## 2015-11-12 MED ORDER — IPRATROPIUM-ALBUTEROL 0.5-2.5 (3) MG/3ML IN SOLN
3.0000 mL | Freq: Once | RESPIRATORY_TRACT | Status: AC
  Administered 2015-11-12: 3 mL via RESPIRATORY_TRACT
  Filled 2015-11-12: qty 3

## 2015-11-12 MED ORDER — ALUM & MAG HYDROXIDE-SIMETH 200-200-20 MG/5ML PO SUSP: 30.0000 mL | Freq: Four times a day (QID) | ORAL | Status: DC | PRN

## 2015-11-12 MED ORDER — BACITRACIN ZINC 500 UNIT/GM EX OINT
TOPICAL_OINTMENT | Freq: Two times a day (BID) | CUTANEOUS | Status: DC
  Administered 2015-11-13: 15.5556 via TOPICAL
  Administered 2015-11-14: 23:00:00 via TOPICAL
  Administered 2015-11-14 – 2015-11-15 (×2): 15.5556 via TOPICAL
  Administered 2015-11-15 – 2015-11-16 (×2): 1 via TOPICAL
  Administered 2015-11-16 – 2015-11-17 (×3): 15.5556 via TOPICAL
  Administered 2015-11-18 (×2): via TOPICAL
  Administered 2015-11-19: 15.5556 via TOPICAL
  Administered 2015-11-20: 22:00:00 via TOPICAL
  Administered 2015-11-20 – 2015-11-21 (×3): 15.5556 via TOPICAL
  Administered 2015-11-22: 22:00:00 via TOPICAL
  Administered 2015-11-23 – 2015-11-24 (×3): 15.5556 via TOPICAL
  Administered 2015-11-24: 1 via TOPICAL
  Administered 2015-11-25 – 2015-12-01 (×11): 15.5556 via TOPICAL
  Filled 2015-11-12 (×2): qty 0.9

## 2015-11-12 MED ORDER — NITROGLYCERIN 0.4 MG SL SUBL: 0.4000 mg | SUBLINGUAL_TABLET | SUBLINGUAL | Status: DC | PRN

## 2015-11-12 MED ORDER — ONDANSETRON HCL 4 MG/2ML IJ SOLN
INTRAMUSCULAR | Status: AC
  Administered 2015-11-12: 4 mg via INTRAVENOUS
  Filled 2015-11-12: qty 2

## 2015-11-12 MED ORDER — METOCLOPRAMIDE HCL 5 MG/ML IJ SOLN
10.0000 mg | Freq: Once | INTRAMUSCULAR | Status: AC
  Administered 2015-11-12: 10 mg via INTRAVENOUS
  Filled 2015-11-12: qty 2

## 2015-11-12 NOTE — ED Notes (Signed)
Lab called and said pt red and light green tube had hemolyzed, I will redraw

## 2015-11-12 NOTE — Progress Notes (Signed)
Hemodialysis completed. 

## 2015-11-12 NOTE — ED Notes (Signed)
Per EMS, patient has has N/V since her dialysis treatment last Friday which the patient said she only completed 75% of.  It has been progressively getting worse.  Pt denies any CP, SOB, Abdominal Pain.  VS were WNL during EMS transport.

## 2015-11-12 NOTE — ED Notes (Signed)
Lab called, the new samples I sent are hemolyzed.  Asking Sam to get another set of red and light green.

## 2015-11-12 NOTE — Progress Notes (Signed)
Pt arrived from ER. Very lethargic, FSBS was noted to be 40. MD notified and administered one amp of D50 and will start IV dextrose

## 2015-11-12 NOTE — Care Management Obs Status (Signed)
[place] NOTIFICATION   Patient Details  Name: NAJIYAH HEINLY MRN: [id number] Date of Birth: 26-Jul-1961   Medicare Observation Status Notification Given:  Yes    Ival Bible, RN 11/12/2015, 2:18 PM

## 2015-11-12 NOTE — ED Notes (Signed)
Report given to Shannin, RN 

## 2015-11-12 NOTE — ED Provider Notes (Signed)
[facility]  I accepted care from Dr. Dahlia Client   ____________________________________________    [place] were viewed by me. Imaging interpreted by radiologist.  Chest x-ray portable one view: Mild worsening of changes of congestive heart failure   CT abdomen and pelvis with contrast:IMPRESSION: Colonic diverticulosis. No radiographic evidence of diverticulitis or bowel obstruction.  Stable moderate bilateral pleural effusions and diffuse body wall edema/anasarca.  Diffuse bilateral renal atrophy, consistent with end-stage renal disease. Two small enhancing masses in upper pole of right kidney, suspicious for small renal cell carcinomas. Recommend urology referral and consider abdomen MRI without contrast for further evaluation.  ____________________________________________   PROCEDURES  Procedure(s) performed: None  Critical Care performed: None  ____________________________________________   INITIAL IMPRESSION / ASSESSMENT AND PLAN / ED COURSE   Pertinent labs & imaging results that were available during my care of the patient were reviewed by me and considered in my medical decision making (see chart for details).  Patient had continuing/persistent nausea with vomiting. She was given another dose of antiemetic by me. Her CT scan shows no medical/surgical intra-abdominal emergency. She continues to have belly pain with nausea.  She also has a cough and wheezing and I added a DuoNeb treatment and a chest x-ray. Chest x-ray was negative for pneumonia/infiltrate.  Patient was presents to have dialysis today and I reviewed her labs show a acute on chronic renal failure with elevated potassium at 5.9. Patient was given temporizing measures. Patient will be admitted to the hospital for symptomatic treatment and observation of her intractable nausea and vomiting and she will need dialysis in the hospital.  CONSULTATIONS: Hospitalist for  admission.    Patient / Family / Caregiver informed of clinical course, medical decision-making process, and agree with plan.       ____________________________________________   FINAL CLINICAL IMPRESSION(S) / ED DIAGNOSES  Final diagnoses:  Nausea and vomiting, vomiting of unspecified type  Generalized abdominal pain  Acute renal failure, unspecified acute renal failure type (HCC)  Hyperkalemia  Intractable vomiting with nausea, vomiting of unspecified type        Lisa Roca, MD 11/12/15 1312

## 2015-11-12 NOTE — ED Provider Notes (Signed)
[facility] Emergency Department Provider Note  ____________________________________________  Time seen: Approximately 302 AM  I have reviewed the triage vital signs and the nursing notes.   HISTORY  Chief Complaint Nausea and Emesis    HPI [name] is a 55 y.o. female who comes into the hospital today with nausea and vomiting. The patient reports that she had dialysis on Friday and she has been vomiting since then. The patient gets dialysis on Monday Wednesday and Friday and was seen on Friday with the same vomiting and sent home.The patient reports that she has a granddaughter who has had strep. She denies abdominal pain reports that she just feels nauseous. She does not really medication she was given on Friday but she feels like she is no better. She's had dry heaves and has been vomiting foam. She reports that she takes Zofran regularly the patient denies any diarrhea. She reports that she was unable to tolerate the vomiting anymore so she came in for evaluation.   Past Medical History  Diagnosis Date  . Asthma   . Collagen vascular disease ([place])   . Diabetes mellitus without complication ([place])   . Hypertension   . Coronary artery disease     a. cath 2013: stenting to RCA (report not available); b. cath 2014: LM nl, pLAD 40%, mLAD nl, ost LCx 40%, mid LCx nl, pRCA 30% @ site of prior stent, mRCA 50%  . Diabetic neuropathy ([place])   . ESRD (end stage renal disease) on dialysis ([place])     M-W-F  . GERD (gastroesophageal reflux disease)   . Hx of pancreatitis 2015  . Myocardial infarction ([place])   . Pneumonia   . Mitral regurgitation     a. echo 10/2013: EF 62%, noWMA, mildly dilated LA, mild to mod MR/TR, GR1DD  . COPD (chronic obstructive pulmonary disease) ([place])   . chronic diastolic CHF 123XX123  . dialysis 2006  . Depression   . Anxiety   . Arthritis   . Headache   . Anemia   . Renal insufficiency     Patient Active Problem List   Diagnosis Date Noted  . Hyperkalemia 10/03/2015  . C. difficile diarrhea 07/22/2015  . Pneumonia 05/21/2015  . Hypoglycemia 04/24/2015  . Unresponsiveness 04/24/2015  . Bradycardia 04/24/2015  . Hypothermia 04/24/2015  . Acute respiratory failure ([place]) 04/24/2015  . Acute diastolic CHF (congestive heart failure) ([place]) 04/05/2015  . Diabetic gastroparesis ([place]) 04/05/2015  . Hypokalemia 04/05/2015  . Generalized weakness 04/05/2015  . Acute pulmonary edema ([place]) 04/03/2015  . Nausea and vomiting 04/03/2015  . Hypoglycemia associated with diabetes ([place]) 04/03/2015  . Anemia of chronic disease 04/03/2015  . Secondary hyperparathyroidism ([place]) 04/03/2015  . Pressure ulcer 04/02/2015  . Acute respiratory failure with hypoxia ([place]) 04/01/2015  . Adjustment disorder with anxiety 03/14/2015  . Somatic symptom disorder, mild 03/08/2015  . Coronary artery disease involving native coronary artery of native heart without angina pectoris   . Nausea & vomiting 03/06/2015  . Abdominal pain 03/06/2015  . DM (diabetes mellitus) ([place]) 03/06/2015  . HTN (hypertension) 03/06/2015  . Gastroparesis 02/24/2015  . Pleural effusion 02/19/2015  . HCAP (healthcare-associated pneumonia) 02/19/2015  . End-stage renal disease on hemodialysis ([place]) 02/19/2015    Past Surgical History  Procedure Laterality Date  . Cholecystectomy    . Appendectomy    . Abdominal hysterectomy    . Dialysis fistula creation Left     upper arm  . Esophagogastroduodenoscopy N/A 03/08/2015  Procedure: ESOPHAGOGASTRODUODENOSCOPY (EGD);  Surgeon: Manya Silvas, MD;  Location[facility] ENDOSCOPY;  Service: Endoscopy;  Laterality: N/A;  . Cardiac catheterization Left 07/26/2015    Procedure: Left Heart Cath and Coronary Angiography;  Surgeon: Dionisio David, MD;  Location: [place] CV LAB;  Service: Cardiovascular;  Laterality: Left;  Marland Kitchen Eye surgery    . Fecal transplant N/A 08/23/2015    Procedure: FECAL TRANSPLANT;   Surgeon: Manya Silvas, MD;  Location: [facility] ENDOSCOPY;  Service: Endoscopy;  Laterality: N/A;    Current Outpatient Rx  Name  Route  Sig  Dispense  Refill  . albuterol (PROVENTIL) (2.5 MG/3ML) 0.083% nebulizer solution   Nebulization   Take 2.5 mg by nebulization every 4 (four) hours as needed for wheezing or shortness of breath.         . Amino Acids-Protein Hydrolys (FEEDING SUPPLEMENT, PRO-STAT SUGAR FREE 64,) LIQD   Oral   Take 30 mLs by mouth 2 (two) times daily between meals.   900 mL   0   . amLODipine (NORVASC) 10 MG tablet   Oral   Take 1 tablet by mouth daily.         Marland Kitchen aspirin EC 81 MG tablet   Oral   Take 81 mg by mouth daily.         Marland Kitchen atorvastatin (LIPITOR) 20 MG tablet   Oral   Take 20 mg by mouth every evening.         Marland Kitchen b complex-vitamin c-folic acid (NEPHRO-VITE) 0.8 MG TABS tablet   Oral   Take 1 tablet by mouth daily.         . carvedilol (COREG) 25 MG tablet   Oral   Take 25 mg by mouth 2 (two) times daily with a meal.         . carvedilol (COREG) 3.125 MG tablet   Oral   Take 8 tablets (25 mg total) by mouth 2 (two) times daily. Patient taking differently: Take 3.125 mg by mouth 2 (two) times daily.    60 tablet   0   . cetirizine (ZYRTEC) 10 MG tablet   Oral   Take 10 mg by mouth daily.         . Cholecalciferol (VITAMIN D3) 400 UNITS tablet   Oral   Take 400 Units by mouth daily.         . citalopram (CELEXA) 20 MG tablet   Oral   Take 20 mg by mouth daily.         . clopidogrel (PLAVIX) 75 MG tablet   Oral   Take 75 mg by mouth daily.         Marland Kitchen glucose 4 GM chewable tablet   Oral   Chew 3-4 tablets by mouth as needed for low blood sugar (if pts blood sugar is less than 60).         Marland Kitchen HYDROcodone-acetaminophen (NORCO/VICODIN) 5-325 MG tablet   Oral   Take 1 tablet by mouth every 6 (six) hours as needed for severe pain.   6 tablet   0   . hydrocortisone (ANUSOL-HC) 25 MG suppository   Rectal   Place  25 mg rectally 2 (two) times daily.         . lipase/protease/amylase (CREON) 12000 UNITS CPEP capsule   Oral   Take 12,000 Units by mouth 3 (three) times daily with meals.          . metoCLOPramide (REGLAN) 5 MG tablet  Oral   Take 1 tablet (5 mg total) by mouth 4 (four) times daily -  before meals and at bedtime.   40 tablet   0   . nitroGLYCERIN (NITROSTAT) 0.4 MG SL tablet   Sublingual   Place 0.4 mg under the tongue every 5 (five) minutes x 3 doses as needed for chest pain. *Max 3 doses per episode*         . pregabalin (LYRICA) 25 MG capsule   Oral   Take 25 mg by mouth 2 (two) times daily as needed (for leg pain).         . promethazine (PHENERGAN) 12.5 MG tablet      1-2 tablets by mouth every 8 hours as needed for nausea and vomiting   20 tablet   0   . promethazine (PHENERGAN) 25 MG suppository   Rectal   Place 1 suppository (25 mg total) rectally every 6 (six) hours as needed for nausea.   12 suppository   1   . promethazine (PHENERGAN) 25 MG tablet   Oral   Take 1 tablet (25 mg total) by mouth every 6 (six) hours as needed for nausea or vomiting.   20 tablet   0   . ranitidine (ZANTAC) 150 MG tablet   Oral   Take 150 mg by mouth daily.          . sevelamer carbonate (RENVELA) 800 MG tablet   Oral   Take 1,600 mg by mouth 3 (three) times daily with meals.          . traMADol (ULTRAM) 50 MG tablet   Oral   Take 1 tablet (50 mg total) by mouth every 6 (six) hours as needed.   20 tablet   0   . vitamin B-12 (CYANOCOBALAMIN) 1000 MCG tablet   Oral   Take 1,000 mcg by mouth daily.           Allergies Compazine; Ace inhibitors; Ativan; Codeine; Gabapentin; Losartan; Ondansetron; Prochlorperazine; Scopolamine; Zofran; Oxycodone; and Tape  Family History  Problem Relation Age of Onset  . Kidney disease Mother   . Diabetes Mother     Social History Social History  Substance Use Topics  . Smoking status: Current Some Day Smoker --  0.50 packs/day    Types: Cigarettes    Last Attempt to Quit: 02/13/2015  . Smokeless tobacco: Never Used  . Alcohol Use: No    Review of Systems Constitutional: No fever/chills Eyes: No visual changes. ENT: No sore throat. Cardiovascular: Denies chest pain. Respiratory: Denies shortness of breath. Gastrointestinal: nausea, vomiting Genitourinary: Negative for dysuria. Musculoskeletal: Negative for back pain. Skin: Negative for rash. Neurological: Negative for headaches, focal weakness or numbness.  10-point ROS otherwise negative.  ____________________________________________   PHYSICAL EXAM:  VITAL SIGNS: ED Triage Vitals  Enc Vitals Group     BP 11/12/15 0315 148/64 mmHg     Pulse Rate 11/12/15 0315 64     Resp 11/12/15 0315 20     Temp 11/12/15 0315 97.7 F (36.5 C)     Temp Source 11/12/15 0315 Oral     SpO2 11/12/15 0304 96 %     Weight 11/12/15 0315 165 lb 4.8 oz (74.98 kg)     Height --      Head Cir --      Peak Flow --      Pain Score 11/12/15 0316 7     Pain Loc --      Pain Edu? --  Excl. in [place]? --     Constitutional: Alert and oriented. Well appearing and in moderate distress. Eyes: Conjunctivae are normal. PERRL. EOMI. Head: Atraumatic. Nose: No congestion/rhinnorhea. Mouth/Throat: Mucous membranes are moist.  Oropharynx non-erythematous. Cardiovascular: Normal rate, regular rhythm. Grossly normal heart sounds.  Good peripheral circulation. Respiratory: Normal respiratory effort.  No retractions. Lungs CTAB. Gastrointestinal: Soft and nontender. No distention. Bowel sounds Musculoskeletal: No lower extremity tenderness nor edema.   Neurologic:  Normal speech and language.  Skin:  Skin is warm, dry and intact.  Psychiatric: Mood and affect are normal.   ____________________________________________   LABS (all labs ordered are listed, but only abnormal results are displayed)  Labs Reviewed  CBC - Abnormal; Notable for the following:     MCHC 31.9 (*)    RDW 21.1 (*)    All other components within normal limits  COMPREHENSIVE METABOLIC PANEL - Abnormal; Notable for the following:    Sodium 134 (*)    Potassium 5.9 (*)    Chloride 95 (*)    Glucose, Bld 62 (*)    BUN 38 (*)    Creatinine, Ser 5.19 (*)    Albumin 3.2 (*)    ALT 12 (*)    GFR calc non Af Amer 8 (*)    GFR calc Af Amer 10 (*)    All other components within normal limits  TROPONIN I  LIPASE, BLOOD   ____________________________________________  EKG  ED ECG REPORT I, Loney Hering, the attending physician, personally viewed and interpreted this ECG.   Date: 11/12/2015  EKG Time: 557  Rate: 66  Rhythm: normal sinus rhythm  Axis: normal  Intervals:none  ST&T Change: normal  ____________________________________________  RADIOLOGY  pending ____________________________________________   PROCEDURES  Procedure(s) performed: None  Critical Care performed: No  ____________________________________________   INITIAL IMPRESSION / ASSESSMENT AND PLAN / ED COURSE  Pertinent labs & imaging results that were available during my care of the patient were reviewed by me and considered in my medical decision making (see chart for details).  This is a 55 year old female who comes into the hospital today with some nausea and vomiting. The patient has had the symptoms since Friday. The patient does appear to have some low blood sugars I will give her half an amp of D50 and we will do a CT scan. The patient has received some Reglan for her vomiting. After receiving the Reglan which did help the nausea the patient complained of some abdominal pain. We will reassess once received the results of her scan.  The patient is awaiting a CT scan. The patient's care was signed out to Dr. Reita Cliche who will follow-up the CT scan and disposition the patient. ____________________________________________   FINAL CLINICAL IMPRESSION(S) / ED DIAGNOSES  Final  diagnoses:  Nausea and vomiting, vomiting of unspecified type  Generalized abdominal pain      Loney Hering, MD 11/12/15 [phone]

## 2015-11-12 NOTE — Progress Notes (Signed)
Subjective:  Patient known to our practice from outpatient dialysis Last HD treatment was on Friday Patient presents for uncontrolled Nausea and vomiting over the weekend Poor food intake  Blood sugar is low, requiring D10   Objective:  Vital signs in last 24 hours:  Temp:  [97.7 F (36.5 C)] 97.7 F (36.5 C) (02/20 0315) Pulse Rate:  [64-73] 73 (02/20 1500) Resp:  [18-28] 18 (02/20 1500) BP: (142-171)/(59-75) 158/64 mmHg (02/20 1500) SpO2:  [96 %-100 %] 98 % (02/20 1500) Weight:  [74.98 kg (165 lb 4.8 oz)] 74.98 kg (165 lb 4.8 oz) (02/20 0315)  Weight change:  Filed Weights   11/12/15 0315  Weight: 74.98 kg (165 lb 4.8 oz)    Intake/Output:   No intake or output data in the 24 hours ending 11/12/15 1552   Physical Exam: General: Chronically ill appearing  HEENT Anicteric, dry oral mucus membranes  Neck supple  Pulm/lungs Clear b/l  CVS/Heart No rub  Abdomen:  Soft, Non tender  Extremities: + dependent edema  Neurologic: Alert, able to answer questions  Skin: No acute rashes  Access: AVF       Basic Metabolic Panel:   Recent Labs Lab 11/09/15 1328 11/12/15 0622  NA 139 134*  K 4.6 5.9*  CL 99* 95*  CO2 30 30  GLUCOSE 36* 62*  BUN 19 38*  CREATININE 3.02* 5.19*  CALCIUM 8.4* 9.4     CBC:  Recent Labs Lab 11/09/15 1131 11/12/15 0556  WBC 5.2 8.7  HGB 12.1 12.2  HCT 38.3 38.1  MCV 86.4 87.3  PLT 190 160      Microbiology:  No results found for this or any previous visit (from the past 720 hour(s)).  Coagulation Studies: No results for input(s): LABPROT, INR in the last 72 hours.  Urinalysis: No results for input(s): COLORURINE, LABSPEC, PHURINE, GLUCOSEU, HGBUR, BILIRUBINUR, KETONESUR, PROTEINUR, UROBILINOGEN, NITRITE, LEUKOCYTESUR in the last 72 hours.  Invalid input(s): APPERANCEUR    Imaging: Ct Abdomen Pelvis W Contrast  11/12/2015  CLINICAL DATA:  Nausea and vomiting for 3 days. Upper abdominal pain for 1 week. Chronic  renal failure on dialysis. EXAM: CT ABDOMEN AND PELVIS WITH CONTRAST TECHNIQUE: Multidetector CT imaging of the abdomen and pelvis was performed using the standard protocol following bolus administration of intravenous contrast. CONTRAST:  135mL OMNIPAQUE IOHEXOL 300 MG/ML  SOLN COMPARISON:  07/22/2015 FINDINGS: Lower chest: Stable mild cardiomegaly. Moderate bilateral pleural effusions and bibasilar atelectasis show no significant change. Hepatobiliary: No masses identified. Periportal edema noted. Prior cholecystectomy noted. No evidence of biliary dilatation. Pancreas: No mass, inflammatory changes, or other significant abnormality. Spleen: Within normal limits in size and appearance. Adrenals/Urinary Tract: No adrenal masses identified. Bilateral diffuse renal parenchymal atrophy, consistent with end-stage renal disease. No evidence of hydronephrosis. Two enhancing lesions are seen in the upper pole the right kidney measuring 1.3 cm anteriorly on image 37/series 2 and 11 mm posteriorly on image 36/series 2. These are suspicious for small renal cell carcinomas. No evidence hydronephrosis. Unopacified urinary bladder is unremarkable in appearance. Stomach/Bowel: No evidence of obstruction, inflammatory process, or abnormal fluid collections. Sigmoid diverticulosis. No radiographic evidence of diverticulitis. Vascular/Lymphatic: No pathologically enlarged lymph nodes. No evidence of abdominal aortic aneurysm. Reproductive: Prior hysterectomy noted. Adnexal regions are unremarkable in appearance. Other: Diffuse body wall edema again demonstrated. Musculoskeletal:  No suspicious bone lesions identified. IMPRESSION: Colonic diverticulosis. No radiographic evidence of diverticulitis or bowel obstruction. Stable moderate bilateral pleural effusions and diffuse body wall edema/anasarca. Diffuse bilateral  renal atrophy, consistent with end-stage renal disease. Two small enhancing masses in upper pole of right kidney,  suspicious for small renal cell carcinomas. Recommend urology referral and consider abdomen MRI without contrast for further evaluation. Electronically Signed   By: Earle Gell M.D.   On: 11/12/2015 11:44   Dg Chest Port 1 View  11/12/2015  CLINICAL DATA:  Nausea and vomiting since dialysis 3 days ago. Unable to finish dialysis at that time. Wheezing. Asthma. EXAM: PORTABLE CHEST 1 VIEW COMPARISON:  10/25/2015. FINDINGS: Stable left axillary and right arm stents. Mildly progressive enlargement of the cardiac silhouette with mildly increased prominence of the pulmonary vasculature and interstitial markings and increased size of bilateral pleural effusions and bibasilar atelectasis. Diffuse osteopenia. IMPRESSION: Mild worsening of changes of congestive heart failure. Electronically Signed   By: Claudie Revering M.D.   On: 11/12/2015 12:54     Medications:     . bacitracin   Topical BID   iohexol  Assessment/ Plan:  55 y.o. 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 admitted for bradycardia and hyperkalemia   [facility] MWF [place]  1. End Stage Renal Disease:  - will arrange for urgent HD today due to hyperkalemia  2. Anemia of chronic kidney disease:  - hemoglobin currently 12.2, likely hemoconcentration - however, will hold EPO  3.  Anasarca + dependent edema No uf tonight  4.  Hyperkalemia - 2 K bath - low K diet - NEPRO supplements as PO intake is poor  5. Gastroparesis, Nausea and vomiting - Phenergan liquid - Reglan  Case discussed with patient and her son   LOSMurlean Iba 2/20/20173:52 PM

## 2015-11-12 NOTE — Progress Notes (Signed)
Called Dr. Jannifer Franklin about medication for sore throat per patient request.  Doctor put in appropriate order.  [name] 11/12/2015  10:26 PM

## 2015-11-12 NOTE — H&P (Signed)
[place] [facility] [place] NAME: [name]    MR#:  [id number]  DATE OF BIRTH:  [birth year]  DATE OF ADMISSION:  11/12/2015  PRIMARY CARE PHYSICIAN: Ellamae Sia, MD   REQUESTING/REFERRING PHYSICIAN: dr Reita Cliche  CHIEF COMPLAINT:  Abdominal pai  HISTORY OF PRESENT ILLNESS:  [name]  is a 55 y.o. female with a known history of ESRD, CAD and Asacol failure who presents with abdominal pain, nausea and vomiting for the past day. Patient is also complaining of shortness of breath. Patient did not go for dialysis today because of abdominal pain. CT of the abdomen ordered in the emergency room showed no acute pathology. Patient reports that her granddaughters have been sick.  PAST MEDICAL HISTORY:   Past Medical History  Diagnosis Date  . Asthma   . Collagen vascular disease ([place])   . Diabetes mellitus without complication ([place])   . Hypertension   . Coronary artery disease     a. cath 2013: stenting to RCA (report not available); b. cath 2014: LM nl, pLAD 40%, mLAD nl, ost LCx 40%, mid LCx nl, pRCA 30% @ site of prior stent, mRCA 50%  . Diabetic neuropathy ([place])   . ESRD (end stage renal disease) on dialysis ([place])     M-W-F  . GERD (gastroesophageal reflux disease)   . Hx of pancreatitis 2015  . Myocardial infarction ([place])   . Pneumonia   . Mitral regurgitation     a. echo 10/2013: EF 62%, noWMA, mildly dilated LA, mild to mod MR/TR, GR1DD  . COPD (chronic obstructive pulmonary disease) ([place])   . chronic diastolic CHF [id number]  . dialysis 2006  . Depression   . Anxiety   . Arthritis   . Headache   . Anemia   . Renal insufficiency     PAST SURGICAL HISTORY:   Past Surgical History  Procedure Laterality Date  . Cholecystectomy    . Appendectomy    . Abdominal hysterectomy    . Dialysis fistula creation Left     upper arm  . Esophagogastroduodenoscopy N/A 03/08/2015    Procedure: ESOPHAGOGASTRODUODENOSCOPY (EGD);  Surgeon:  Manya Silvas, MD;  Location: [facility] ENDOSCOPY;  Service: Endoscopy;  Laterality: N/A;  . Cardiac catheterization Left 07/26/2015    Procedure: Left Heart Cath and Coronary Angiography;  Surgeon: Dionisio David, MD;  Location: [facility] CV LAB;  Service: Cardiovascular;  Laterality: Left;  Marland Kitchen Eye surgery    . Fecal transplant N/A 08/23/2015    Procedure: FECAL TRANSPLANT;  Surgeon: Manya Silvas, MD;  Location: [facility] ENDOSCOPY;  Service: Endoscopy;  Laterality: N/A;    SOCIAL HISTORY:   Social History  Substance Use Topics  . Smoking status: Current Some Day Smoker -- 0.50 packs/day    Types: Cigarettes    Last Attempt to Quit: 02/13/2015  . Smokeless tobacco: Never Used  . Alcohol Use: No    FAMILY HISTORY:   Family History  Problem Relation Age of Onset  . Kidney disease Mother   . Diabetes Mother     DRUG ALLERGIES:   Allergies  Allergen Reactions  . Compazine [Prochlorperazine Edisylate] Anaphylaxis and Nausea And Vomiting  . Ace Inhibitors Swelling  . Ativan [Lorazepam] Other (See Comments)    Reaction:  Hallucinations and headaches  . Codeine Nausea And Vomiting  . Gabapentin Other (See Comments)    Reaction:  Unknown   . Losartan Other (See Comments)    Reaction:  Unknown   . Ondansetron Other (See Comments)    Reaction:  Unknown   . Prochlorperazine Other (See Comments)    Reaction:  Unknown   . Scopolamine Other (See Comments)    Reaction:  Unknown   . Zofran [Ondansetron Hcl] Other (See Comments)    Reaction:  hallucinations   . Oxycodone Anxiety  . Tape Rash     REVIEW OF SYSTEMS:  CONSTITUTIONAL: No fever, fatigue or weakness.  EYES: No blurred or double vision.  EARS, NOSE, AND THROAT: No tinnitus or ear pain.  RESPIRATORY: No cough, shortness of breath, wheezing or hemoptysis.  CARDIOVASCULAR: No chest pain, orthopnea, edema.  GASTROINTESTINAL: ++ nausea, vomiting, NOdiarrhea ++ abdominal pain.  GENITOURINARY: No dysuria, hematuria.   ENDOCRINE: No polyuria, nocturia,  HEMATOLOGY: No anemia, easy bruising or bleeding SKIN: No rash or lesion. MUSCULOSKELETAL: No joint pain or arthritis.   NEUROLOGIC: No tingling, numbness, weakness.  PSYCHIATRY: No anxiety or depression.   MEDICATIONS AT HOME:   Prior to Admission medications   Medication Sig Start Date End Date Taking? Authorizing Provider  acidophilus (RISAQUAD) CAPS capsule Take 1 capsule by mouth daily.   Yes Historical Provider, MD  albuterol (PROVENTIL) (2.5 MG/3ML) 0.083% nebulizer solution Take 2.5 mg by nebulization every 4 (four) hours as needed for wheezing or shortness of breath.   Yes Historical Provider, MD  amiodarone (PACERONE) 200 MG tablet Take 400 mg by mouth daily.   Yes Historical Provider, MD  amLODipine (NORVASC) 10 MG tablet Take 1 tablet by mouth daily. 10/10/15  Yes Historical Provider, MD  aspirin EC 81 MG tablet Take 81 mg by mouth daily.   Yes Historical Provider, MD  atorvastatin (LIPITOR) 20 MG tablet Take 20 mg by mouth every evening.   Yes Historical Provider, MD  b complex-vitamin c-folic acid (NEPHRO-VITE) 0.8 MG TABS tablet Take 1 tablet by mouth daily.   Yes Historical Provider, MD  carvedilol (COREG) 25 MG tablet Take 25 mg by mouth 2 (two) times daily with a meal.   Yes Historical Provider, MD  carvedilol (COREG) 25 MG tablet Take 25 mg by mouth 2 (two) times daily with a meal.   Yes Historical Provider, MD  cetirizine (ZYRTEC) 10 MG tablet Take 10 mg by mouth daily.   Yes Historical Provider, MD  Cholecalciferol (VITAMIN D3) 400 UNITS tablet Take 400 Units by mouth daily.   Yes Historical Provider, MD  citalopram (CELEXA) 20 MG tablet Take 20 mg by mouth daily.   Yes Historical Provider, MD  clopidogrel (PLAVIX) 75 MG tablet Take 75 mg by mouth daily.   Yes Historical Provider, MD  glucose 4 GM chewable tablet Chew 3-4 tablets by mouth as needed for low blood sugar (if pts blood sugar is less than 60).   Yes Historical Provider, MD   HYDROcodone-acetaminophen (NORCO/VICODIN) 5-325 MG tablet Take 1 tablet by mouth every 6 (six) hours as needed for severe pain. Patient taking differently: Take 1 tablet by mouth every 8 (eight) hours as needed for severe pain.  10/22/15  Yes Anne-Caroline Mariea Clonts, MD  lipase/protease/amylase (CREON) 12000 UNITS CPEP capsule Take 12,000 Units by mouth 3 (three) times daily with meals.    Yes Historical Provider, MD  metoCLOPramide (REGLAN) 5 MG tablet Take 1 tablet (5 mg total) by mouth 4 (four) times daily -  before meals and at bedtime. 02/24/15  Yes Srikar Sudini, MD  metroNIDAZOLE (FLAGYL) 500 MG tablet Take 500 mg by mouth every 8 (eight) hours.   Yes  Historical Provider, MD  nitroGLYCERIN (NITROSTAT) 0.4 MG SL tablet Place 0.4 mg under the tongue every 5 (five) minutes x 3 doses as needed for chest pain. *Max 3 doses per episode*   Yes Historical Provider, MD  pantoprazole (PROTONIX) 40 MG tablet Take 40 mg by mouth daily.   Yes Historical Provider, MD  pregabalin (LYRICA) 25 MG capsule Take 25 mg by mouth 2 (two) times daily as needed (for leg pain).   Yes Historical Provider, MD  promethazine (PHENERGAN) 12.5 MG tablet 1-2 tablets by mouth every 8 hours as needed for nausea and vomiting Patient taking differently: 12.5 mg every 6 (six) hours as needed for nausea or vomiting. 1-2 tablets by mouth every 8 hours as needed for nausea and vomiting 10/22/15  Yes Anne-Caroline Mariea Clonts, MD  promethazine (PHENERGAN) 25 MG suppository Place 1 suppository (25 mg total) rectally every 6 (six) hours as needed for nausea. Patient taking differently: Place 25 mg rectally every 8 (eight) hours as needed for nausea.  10/25/15 10/24/16 Yes Daymon Larsen, MD  promethazine (PHENERGAN) 25 MG tablet Take 1 tablet (25 mg total) by mouth every 6 (six) hours as needed for nausea or vomiting. 11/09/15  Yes Earleen Newport, MD  ranolazine (RANEXA) 500 MG 12 hr tablet Take 1,000 mg by mouth 2 (two) times daily.   Yes  Historical Provider, MD  vitamin B-12 (CYANOCOBALAMIN) 1000 MCG tablet Take 1,000 mcg by mouth daily.   Yes Historical Provider, MD  sevelamer carbonate (RENVELA) 800 MG tablet Take 800 mg by mouth 3 (three) times daily with meals.     Historical Provider, MD      VITAL SIGNS:  Blood pressure 155/62, pulse 67, temperature 97.7 F (36.5 C), temperature source Oral, resp. rate 19, weight 74.98 kg (165 lb 4.8 oz), SpO2 100 %.  PHYSICAL EXAMINATION:  GENERAL:  55 y.o.-year-old patient lying in the bed with no acute distress.  EYES: Pupils equal, round, reactive to light and accommodation. No scleral icterus. Extraocular muscles intact.  HEENT: Head atraumatic, normocephalic. Oropharynx and nasopharynx clear.  NECK:  Supple, no jugular venous distention. No thyroid enlargement, no tenderness.  LUNGS: Normal breath sounds bilaterally, no wheezing, rales,rhonchi or crepitation. No use of accessory muscles of respiration.  CARDIOVASCULAR: S1, S2 normal. No murmurs, rubs, or gallops.  ABDOMEN: Soft, nontender, nondistended. Bowel sounds present. No organomegaly or mass.  EXTREMITIES: No pedal edema, cyanosis, or clubbing.  NEUROLOGIC: Cranial nerves II through XII are grossly intact. No focal deficits. PSYCHIATRIC: The patient is alert and oriented x 3.  SKIN: No obvious rash, lesion, or ulcer.   LABORATORY PANEL:   CBC  Recent Labs Lab 11/12/15 0556  WBC 8.7  HGB 12.2  HCT 38.1  PLT 160   ------------------------------------------------------------------------------------------------------------------  Chemistries   Recent Labs Lab 11/12/15 0622  NA 134*  K 5.9*  CL 95*  CO2 30  GLUCOSE 62*  BUN 38*  CREATININE 5.19*  CALCIUM 9.4  AST 18  ALT 12*  ALKPHOS 125  BILITOT 1.0   ------------------------------------------------------------------------------------------------------------------  Cardiac Enzymes  Recent Labs Lab 11/12/15 0622  TROPONINI <0.03    ------------------------------------------------------------------------------------------------------------------  RADIOLOGY:  Ct Abdomen Pelvis W Contrast  11/12/2015  CLINICAL DATA:  Nausea and vomiting for 3 days. Upper abdominal pain for 1 week. Chronic renal failure on dialysis. EXAM: CT ABDOMEN AND PELVIS WITH CONTRAST TECHNIQUE: Multidetector CT imaging of the abdomen and pelvis was performed using the standard protocol following bolus administration of intravenous contrast. CONTRAST:  172mL  OMNIPAQUE IOHEXOL 300 MG/ML  SOLN COMPARISON:  07/22/2015 FINDINGS: Lower chest: Stable mild cardiomegaly. Moderate bilateral pleural effusions and bibasilar atelectasis show no significant change. Hepatobiliary: No masses identified. Periportal edema noted. Prior cholecystectomy noted. No evidence of biliary dilatation. Pancreas: No mass, inflammatory changes, or other significant abnormality. Spleen: Within normal limits in size and appearance. Adrenals/Urinary Tract: No adrenal masses identified. Bilateral diffuse renal parenchymal atrophy, consistent with end-stage renal disease. No evidence of hydronephrosis. Two enhancing lesions are seen in the upper pole the right kidney measuring 1.3 cm anteriorly on image 37/series 2 and 11 mm posteriorly on image 36/series 2. These are suspicious for small renal cell carcinomas. No evidence hydronephrosis. Unopacified urinary bladder is unremarkable in appearance. Stomach/Bowel: No evidence of obstruction, inflammatory process, or abnormal fluid collections. Sigmoid diverticulosis. No radiographic evidence of diverticulitis. Vascular/Lymphatic: No pathologically enlarged lymph nodes. No evidence of abdominal aortic aneurysm. Reproductive: Prior hysterectomy noted. Adnexal regions are unremarkable in appearance. Other: Diffuse body wall edema again demonstrated. Musculoskeletal:  No suspicious bone lesions identified. IMPRESSION: Colonic diverticulosis. No radiographic  evidence of diverticulitis or bowel obstruction. Stable moderate bilateral pleural effusions and diffuse body wall edema/anasarca. Diffuse bilateral renal atrophy, consistent with end-stage renal disease. Two small enhancing masses in upper pole of right kidney, suspicious for small renal cell carcinomas. Recommend urology referral and consider abdomen MRI without contrast for further evaluation. Electronically Signed   By: Earle Gell M.D.   On: 11/12/2015 11:44   Dg Chest Port 1 View  11/12/2015  CLINICAL DATA:  Nausea and vomiting since dialysis 3 days ago. Unable to finish dialysis at that time. Wheezing. Asthma. EXAM: PORTABLE CHEST 1 VIEW COMPARISON:  10/25/2015. FINDINGS: Stable left axillary and right arm stents. Mildly progressive enlargement of the cardiac silhouette with mildly increased prominence of the pulmonary vasculature and interstitial markings and increased size of bilateral pleural effusions and bibasilar atelectasis. Diffuse osteopenia. IMPRESSION: Mild worsening of changes of congestive heart failure. Electronically Signed   By: Claudie Revering M.D.   On: 11/12/2015 12:54    EKG:   Sinus rhythm no ST elevation or depression  IMPRESSION AND PLAN:   55 year old female with a history of CAD, diabetes, ESRD on hemodialysis and chronic diastolic heart failure who presents with abdominal pain and nausea.   1. Abdominal pain with nausea and vomiting: This is likely secondary to viral etiology. Continue supportive management. CT of the abdomen to the emergency department reveals no acute pathology.  2. Acute on chronic diastolic congestive heart failure: This is due to her underlying renal disease. Patient will need to undergo dialysis. Patient has been hospitalized in the past for acute respiratory failure and has had to have thoracentesis. She is not at that point at this time.  3. ESRD on hemodialysis: Patient will need to undergo dialysis. She has evidence of congestive heart  failure and x-ray as well as hyperkalemia. Monday, Wednesday and Friday are her normal dialysis days. She did not go to dialysis today.  4. Diabetes type 2: Continue sliding scale insulin. Patient is hypoglycemic. Patient received orange juice and her blood sugars have improved.   5. CAD: Continue amiodarone, plavix and Coreg.  6. Essential HTN: Continue Norvasc and Coreg.  7 per chronic pink otitis: Continue Creon   All the records are reviewed and case discussed with ED provider. Management plans discussed with the patient and she is in agreement.  CODE STATUS: full  TOTAL TIME TAKING CARE OF THIS PATIENT: 50 minutes.  Kaelynn Igo M.D on 11/12/2015 at 2:07 PM  Between 7am to 6pm - Pager - [phone] After 6pm go to [email or web address] - password [email or web address] [facility]  Office  [phone]  CC: Primary care physician; Ellamae Sia, MD

## 2015-11-12 NOTE — ED Notes (Signed)
Resent red and light green top blood tubes

## 2015-11-13 ENCOUNTER — Observation Stay: Payer: Medicare Other

## 2015-11-13 LAB — COMPREHENSIVE METABOLIC PANEL WITH GFR
ALT: 13 U/L — ABNORMAL LOW (ref 14–54)
ALT: 11 U/L — ABNORMAL LOW (ref 14–54)
AST: 25 U/L (ref 15–41)
AST: 20 U/L (ref 15–41)
Albumin: 3 g/dL — ABNORMAL LOW (ref 3.5–5.0)
Albumin: 3.1 g/dL — ABNORMAL LOW (ref 3.5–5.0)
Alkaline Phosphatase: 137 U/L — ABNORMAL HIGH (ref 38–126)
Alkaline Phosphatase: 144 U/L — ABNORMAL HIGH (ref 38–126)
Anion gap: 9 (ref 5–15)
Anion gap: 8 (ref 5–15)
BUN: 20 mg/dL (ref 6–20)
BUN: 22 mg/dL — ABNORMAL HIGH (ref 6–20)
CO2: 29 mmol/L (ref 22–32)
CO2: 32 mmol/L (ref 22–32)
Calcium: 8.5 mg/dL — ABNORMAL LOW (ref 8.9–10.3)
Calcium: 8.4 mg/dL — ABNORMAL LOW (ref 8.9–10.3)
Chloride: 96 mmol/L — ABNORMAL LOW (ref 101–111)
Chloride: 93 mmol/L — ABNORMAL LOW (ref 101–111)
Creatinine, Ser: 3.78 mg/dL — ABNORMAL HIGH (ref 0.44–1.00)
Creatinine, Ser: 4.13 mg/dL — ABNORMAL HIGH (ref 0.44–1.00)
GFR calc Af Amer: 14 mL/min — ABNORMAL LOW
GFR calc Af Amer: 13 mL/min — ABNORMAL LOW
GFR calc non Af Amer: 12 mL/min — ABNORMAL LOW
GFR calc non Af Amer: 11 mL/min — ABNORMAL LOW
Glucose, Bld: 84 mg/dL (ref 65–99)
Glucose, Bld: 78 mg/dL (ref 65–99)
Potassium: 5.4 mmol/L — ABNORMAL HIGH (ref 3.5–5.1)
Potassium: 5 mmol/L (ref 3.5–5.1)
Sodium: 134 mmol/L — ABNORMAL LOW (ref 135–145)
Sodium: 133 mmol/L — ABNORMAL LOW (ref 135–145)
Total Bilirubin: 0.9 mg/dL (ref 0.3–1.2)
Total Bilirubin: 1.1 mg/dL (ref 0.3–1.2)
Total Protein: 7 g/dL (ref 6.5–8.1)
Total Protein: 7.4 g/dL (ref 6.5–8.1)

## 2015-11-13 LAB — GLUCOSE, CAPILLARY
Glucose-Capillary: 94 mg/dL (ref 65–99)
Glucose-Capillary: 99 mg/dL (ref 65–99)
Glucose-Capillary: 88 mg/dL (ref 65–99)
Glucose-Capillary: 83 mg/dL (ref 65–99)
Glucose-Capillary: 82 mg/dL (ref 65–99)
Glucose-Capillary: 67 mg/dL (ref 65–99)
Glucose-Capillary: 161 mg/dL — ABNORMAL HIGH (ref 65–99)
Glucose-Capillary: 61 mg/dL — ABNORMAL LOW (ref 65–99)
Glucose-Capillary: 104 mg/dL — ABNORMAL HIGH (ref 65–99)
Glucose-Capillary: 33 mg/dL — CL (ref 65–99)
Glucose-Capillary: 122 mg/dL — ABNORMAL HIGH (ref 65–99)
Glucose-Capillary: 34 mg/dL — CL (ref 65–99)
Glucose-Capillary: 76 mg/dL (ref 65–99)
Glucose-Capillary: 62 mg/dL — ABNORMAL LOW (ref 65–99)

## 2015-11-13 LAB — BLOOD GAS, ARTERIAL
Acid-Base Excess: 7.9 mmol/L — ABNORMAL HIGH (ref 0.0–3.0)
Allens test (pass/fail): POSITIVE — AB
Bicarbonate: 33.3 mEq/L — ABNORMAL HIGH (ref 21.0–28.0)
Delivery systems: POSITIVE
Expiratory PAP: 6
FIO2: 0.35
Inspiratory PAP: 12
O2 Saturation: 95 %
Patient temperature: 37
pCO2 arterial: 49 mmHg — ABNORMAL HIGH (ref 32.0–48.0)
pH, Arterial: 7.44 (ref 7.350–7.450)
pO2, Arterial: 73 mmHg — ABNORMAL LOW (ref 83.0–108.0)

## 2015-11-13 LAB — MRSA PCR SCREENING: MRSA by PCR: NEGATIVE

## 2015-11-13 MED ORDER — DEXTROSE 5 % IV SOLN
INTRAVENOUS | Status: DC
  Administered 2015-11-13: 19:00:00 via INTRAVENOUS

## 2015-11-13 MED ORDER — GLUCAGON HCL RDNA (DIAGNOSTIC) 1 MG IJ SOLR
1.0000 mg | Freq: Once | INTRAMUSCULAR | Status: AC | PRN
  Administered 2015-11-13: 1 mg via INTRAVENOUS
  Filled 2015-11-13: qty 1

## 2015-11-13 MED ORDER — DEXTROSE 50 % IV SOLN
INTRAVENOUS | Status: AC
  Administered 2015-11-13: 19:00:00
  Filled 2015-11-13: qty 50

## 2015-11-13 MED ORDER — FUROSEMIDE 10 MG/ML IJ SOLN
80.0000 mg | Freq: Two times a day (BID) | INTRAMUSCULAR | Status: DC
  Administered 2015-11-13 – 2015-11-14 (×2): 80 mg via INTRAVENOUS
  Filled 2015-11-13 (×2): qty 8

## 2015-11-13 MED ORDER — ALBUTEROL SULFATE (2.5 MG/3ML) 0.083% IN NEBU: 2.5000 mg | INHALATION_SOLUTION | RESPIRATORY_TRACT | Status: DC

## 2015-11-13 MED ORDER — DEXTROSE 50 % IV SOLN
1.0000 | Freq: Once | INTRAVENOUS | Status: AC
  Administered 2015-11-13: 50 mL via INTRAVENOUS

## 2015-11-13 MED ORDER — DEXTROSE 50 % IV SOLN
INTRAVENOUS | Status: AC
  Administered 2015-11-13: 25 mL via INTRAVENOUS
  Filled 2015-11-13: qty 50

## 2015-11-13 MED ORDER — DEXTROSE 50 % IV SOLN
25.0000 mL | Freq: Once | INTRAVENOUS | Status: AC
  Administered 2015-11-13: 25 mL via INTRAVENOUS

## 2015-11-13 MED ORDER — DEXTROSE 10 % IV SOLN
INTRAVENOUS | Status: DC
  Administered 2015-11-13 – 2015-11-14 (×2): via INTRAVENOUS

## 2015-11-13 MED ORDER — HYDROXYZINE HCL 25 MG PO TABS
25.0000 mg | ORAL_TABLET | Freq: Once | ORAL | Status: AC
  Administered 2015-11-13: 25 mg via ORAL
  Filled 2015-11-13: qty 1

## 2015-11-13 MED ORDER — DEXTROSE 50 % IV SOLN
INTRAVENOUS | Status: AC
  Administered 2015-11-13: 50 mL
  Filled 2015-11-13: qty 50

## 2015-11-13 MED ORDER — DEXTROSE 50 % IV SOLN
1.0000 | Freq: Once | INTRAVENOUS | Status: AC
Start: 2015-11-13 — End: 2015-11-13
  Administered 2015-11-13: 50 mL via INTRAVENOUS
  Filled 2015-11-13: qty 50

## 2015-11-13 NOTE — Progress Notes (Signed)
Transported pt to [facility] on [facility] 12/6, R-10, FIO2 35%. Pt. tol well at this time.

## 2015-11-13 NOTE — Progress Notes (Signed)
Called Dr. Jannifer Franklin due to patient's crackles in lungs and anxiety.  Doctor put in appropriate orders.  Christene Slates 11/13/2015  4:52 AM

## 2015-11-13 NOTE — Care Management (Signed)
Patient presents to the ED after missed dialysis due to abdominal pain.  Patient is HD patient MWF at [facility] on [address].  [facility] and utilizes [facility]. Patient has a walker at home.  Open with [facility] for Nursing and PT.  Malachy Mood with [facility] notified of admission.  Patient meets criteria for for [facility], however patient wishes to remain with [facility].  Will need resumption of home health orders at time of discharge.  RNCM following

## 2015-11-13 NOTE — Progress Notes (Signed)
Subjective:  Patient known to our practice from outpatient dialysis  Patient presents for uncontrolled Nausea and vomiting over the weekend  patient appears to be SOB today No fluid removed with HD yesterday D10 is now off   Objective:  Vital signs in last 24 hours:  Temp:  [97.7 F (36.5 C)-98.6 F (37 C)] 98.6 F (37 C) (02/21 0620) Pulse Rate:  [60-76] 76 (02/21 0620) Resp:  [16-27] 20 (02/21 0620) BP: (143-171)/(55-85) 156/58 mmHg (02/21 0620) SpO2:  [92 %-100 %] 98 % (02/21 0620) Weight:  [73.301 kg (161 lb 9.6 oz)-76.023 kg (167 lb 9.6 oz)] 73.301 kg (161 lb 9.6 oz) (02/20 2208)  Weight change: 1.043 kg (2 lb 4.8 oz) Filed Weights   11/12/15 1800 11/12/15 2115 11/12/15 2208  Weight: 76 kg (167 lb 8.8 oz) 76 kg (167 lb 8.8 oz) 73.301 kg (161 lb 9.6 oz)    Intake/Output:    Intake/Output Summary (Last 24 hours) at 11/13/15 1007 Last data filed at 11/12/15 2115  Gross per 24 hour  Intake      0 ml  Output      0 ml  Net      0 ml     Physical Exam: General: Chronically ill appearing  HEENT Anicteric, dry oral mucus membranes  Neck supple  Pulm/lungs Diffuse crackles b/l  CVS/Heart No rub  Abdomen:  Soft, Non tender  Extremities: + dependent edema  Neurologic: Alert,   Skin: No acute rashes  Access: AVF       Basic Metabolic Panel:   Recent Labs Lab 11/09/15 1328 11/12/15 0622 11/13/15 0744  NA 139 134* 134*  K 4.6 5.9* 5.4*  CL 99* 95* 96*  CO2 30 30 29   GLUCOSE 36* 62* 84  BUN 19 38* 20  CREATININE 3.02* 5.19* 3.78*  CALCIUM 8.4* 9.4 8.5*     CBC:  Recent Labs Lab 11/09/15 1131 11/12/15 0556  WBC 5.2 8.7  HGB 12.1 12.2  HCT 38.3 38.1  MCV 86.4 87.3  PLT 190 160      Microbiology:  No results found for this or any previous visit (from the past 720 hour(s)).  Coagulation Studies: No results for input(s): LABPROT, INR in the last 72 hours.  Urinalysis: No results for input(s): COLORURINE, LABSPEC, PHURINE, GLUCOSEU,  HGBUR, BILIRUBINUR, KETONESUR, PROTEINUR, UROBILINOGEN, NITRITE, LEUKOCYTESUR in the last 72 hours.  Invalid input(s): APPERANCEUR    Imaging: Ct Abdomen Pelvis W Contrast  11/12/2015  CLINICAL DATA:  Nausea and vomiting for 3 days. Upper abdominal pain for 1 week. Chronic renal failure on dialysis. EXAM: CT ABDOMEN AND PELVIS WITH CONTRAST TECHNIQUE: Multidetector CT imaging of the abdomen and pelvis was performed using the standard protocol following bolus administration of intravenous contrast. CONTRAST:  160mL OMNIPAQUE IOHEXOL 300 MG/ML  SOLN COMPARISON:  07/22/2015 FINDINGS: Lower chest: Stable mild cardiomegaly. Moderate bilateral pleural effusions and bibasilar atelectasis show no significant change. Hepatobiliary: No masses identified. Periportal edema noted. Prior cholecystectomy noted. No evidence of biliary dilatation. Pancreas: No mass, inflammatory changes, or other significant abnormality. Spleen: Within normal limits in size and appearance. Adrenals/Urinary Tract: No adrenal masses identified. Bilateral diffuse renal parenchymal atrophy, consistent with end-stage renal disease. No evidence of hydronephrosis. Two enhancing lesions are seen in the upper pole the right kidney measuring 1.3 cm anteriorly on image 37/series 2 and 11 mm posteriorly on image 36/series 2. These are suspicious for small renal cell carcinomas. No evidence hydronephrosis. Unopacified urinary bladder is unremarkable in appearance. Stomach/Bowel:  No evidence of obstruction, inflammatory process, or abnormal fluid collections. Sigmoid diverticulosis. No radiographic evidence of diverticulitis. Vascular/Lymphatic: No pathologically enlarged lymph nodes. No evidence of abdominal aortic aneurysm. Reproductive: Prior hysterectomy noted. Adnexal regions are unremarkable in appearance. Other: Diffuse body wall edema again demonstrated. Musculoskeletal:  No suspicious bone lesions identified. IMPRESSION: Colonic diverticulosis. No  radiographic evidence of diverticulitis or bowel obstruction. Stable moderate bilateral pleural effusions and diffuse body wall edema/anasarca. Diffuse bilateral renal atrophy, consistent with end-stage renal disease. Two small enhancing masses in upper pole of right kidney, suspicious for small renal cell carcinomas. Recommend urology referral and consider abdomen MRI without contrast for further evaluation. Electronically Signed   By: Earle Gell M.D.   On: 11/12/2015 11:44   Dg Chest Port 1 View  11/12/2015  CLINICAL DATA:  Nausea and vomiting since dialysis 3 days ago. Unable to finish dialysis at that time. Wheezing. Asthma. EXAM: PORTABLE CHEST 1 VIEW COMPARISON:  10/25/2015. FINDINGS: Stable left axillary and right arm stents. Mildly progressive enlargement of the cardiac silhouette with mildly increased prominence of the pulmonary vasculature and interstitial markings and increased size of bilateral pleural effusions and bibasilar atelectasis. Diffuse osteopenia. IMPRESSION: Mild worsening of changes of congestive heart failure. Electronically Signed   By: Claudie Revering M.D.   On: 11/12/2015 12:54     Medications:     . acidophilus  1 capsule Oral Daily  . amiodarone  400 mg Oral Daily  . amLODipine  10 mg Oral Daily  . aspirin EC  81 mg Oral Daily  . atorvastatin  20 mg Oral QPM  . bacitracin   Topical BID  . carvedilol  25 mg Oral BID WC  . cholecalciferol  400 Units Oral Daily  . citalopram  20 mg Oral Daily  . clopidogrel  75 mg Oral Daily  . feeding supplement (NEPRO CARB STEADY)  237 mL Oral BID BM  . heparin  5,000 Units Subcutaneous 3 times per day  . lipase/protease/amylase  12,000 Units Oral TID WC  . loratadine  10 mg Oral Daily  . metoCLOPramide  5 mg Oral TID AC & HS  . multivitamin  1 tablet Oral QHS  . pantoprazole  40 mg Oral Daily  . ranolazine  1,000 mg Oral BID  . sevelamer carbonate  800 mg Oral TID WC  . vitamin B-12  1,000 mcg Oral Daily   acetaminophen  **OR** acetaminophen, albuterol, alum & mag hydroxide-simeth, bisacodyl, glucose, iohexol, nitroGLYCERIN, oxyCODONE, phenol, pregabalin, promethazine, senna-docusate  Assessment/ Plan:  55 y.o. 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 admitted for bradycardia and hyperkalemia   [facility] [place]  1. End Stage Renal Disease:  - will arrange for urgent HD today due to hyperkalemia and fluid overload  2. Anemia of chronic kidney disease:  - hemoglobin currently 12.2, likely hemoconcentration - however, will hold EPO  3.  Anasarca/acute pulm edema + dependent edema - UF with HD today Goal 2-3 L  4.  Hyperkalemia - 2 K bath - low K diet - NEPRO supplements as PO intake is poor  5. Gastroparesis, Nausea and vomiting - Phenergan liquid - Reglan      LOS:  Bryler Dibble 2/21/201710:07 AM

## 2015-11-13 NOTE — Progress Notes (Signed)
Dr. Vianne Bulls and Dr. Candiss Norse notified of increased shortness of breath and coughing. Breathing treatment administered. Glucose of 61. 1 AMP D50 given. Glucose up to 161. Patient not verbally communicating at baseline. C-PAP ordered per Dr. Candiss Norse. Respiratory notified.

## 2015-11-13 NOTE — Progress Notes (Signed)
Patient arrived back from dialysis A&O, and short of breath. Crackles and wheezing present, worse than this morning. Glucose of 33. 1 amp dextrose administered. Sugar improved to 120's.  3L taken off patient in dialysis. Respiratory notified, cpap applied. Dr. Benjie Karvonen notified of worstening status. Bipap, transfer to stepdown, Dextrose, ABG, and lasix ordered. Continuing to monitor.

## 2015-11-13 NOTE — Progress Notes (Signed)
Spoke to Dr. Estanislado Pandy regarding patient feeling short of breath.  Doctor ordered albuterol treatment q4 prn.  Christene Slates  11/13/2015 4:54 AM

## 2015-11-13 NOTE — Progress Notes (Signed)
Initial Nutrition Assessment     INTERVENTION:  Meals and snacks: Cater to pt preferences Nutrition Supplement Therapy: Agree with nepro for added nutrition Coordination of care: Spoke with RN, [name] regarding pt status of shortness of breath and would not answer this writer's questions during visit   NUTRITION DIAGNOSIS:   Inadequate oral intake related to altered GI function as evidenced by per patient/family report.    GOAL:   Patient will meet greater than or equal to 90% of their needs    MONITOR:    (Energy intake, Digestive system)  REASON FOR ASSESSMENT:    (dialysis pt)    ASSESSMENT:      Pt admitted with nausea, vomiting, bradycardia and hyperkalemia.  Planning urgent HD today secondary to shortness of breath  Past Medical History  Diagnosis Date  . Asthma   . Collagen vascular disease ([place])   . Diabetes mellitus without complication ([place])   . Hypertension   . Coronary artery disease     a. cath 2013: stenting to RCA (report not available); b. cath 2014: LM nl, pLAD 40%, mLAD nl, ost LCx 40%, mid LCx nl, pRCA 30% @ site of prior stent, mRCA 50%  . Diabetic neuropathy ([place])   . ESRD (end stage renal disease) on dialysis ([place])     M-W-F  . GERD (gastroesophageal reflux disease)   . Hx of pancreatitis 2015  . Myocardial infarction ([place])   . Pneumonia   . Mitral regurgitation     a. echo 10/2013: EF 62%, noWMA, mildly dilated LA, mild to mod MR/TR, GR1DD  . COPD (chronic obstructive pulmonary disease) ([place])   . chronic diastolic CHF [id number]  . dialysis 2006  . Depression   . Anxiety   . Arthritis   . Headache   . Anemia   . Renal insufficiency     Current Nutrition: pt would not respond to this writer's questions this am  Food/Nutrition-Related History: unable to tell this writer   Scheduled Medications:  . acidophilus  1 capsule Oral Daily  . amiodarone  400 mg Oral Daily  . amLODipine  10 mg Oral Daily  . aspirin EC  81 mg Oral Daily   . atorvastatin  20 mg Oral QPM  . bacitracin   Topical BID  . carvedilol  25 mg Oral BID WC  . cholecalciferol  400 Units Oral Daily  . citalopram  20 mg Oral Daily  . clopidogrel  75 mg Oral Daily  . feeding supplement (NEPRO CARB STEADY)  237 mL Oral BID BM  . heparin  5,000 Units Subcutaneous 3 times per day  . lipase/protease/amylase  12,000 Units Oral TID WC  . loratadine  10 mg Oral Daily  . metoCLOPramide  5 mg Oral TID AC & HS  . multivitamin  1 tablet Oral QHS  . pantoprazole  40 mg Oral Daily  . ranolazine  1,000 mg Oral BID  . sevelamer carbonate  800 mg Oral TID WC  . vitamin B-12  1,000 mcg Oral Daily       Electrolyte/Renal Profile and Glucose Profile:   Recent Labs Lab 11/09/15 1328 11/12/15 0622 11/13/15 0744  NA 139 134* 134*  K 4.6 5.9* 5.4*  CL 99* 95* 96*  CO2 30 30 29   BUN 19 38* 20  CREATININE 3.02* 5.19* 3.78*  CALCIUM 8.4* 9.4 8.5*  GLUCOSE 36* 62* 84   Protein Profile:  Recent Labs Lab 11/09/15 1328 11/12/15 0622 11/13/15 0744  ALBUMIN 3.1* 3.2* 3.0*  Gastrointestinal Profile: Last BM: 2/19   Nutrition-Focused Physical Exam Findings: deferred at this time   Weight Change: noted wt gain     Diet Order:  Diet renal/carb modified with fluid restriction Diet-HS Snack?: Nothing; Room service appropriate?: Yes; Fluid consistency:: Thin  Skin:   reviewed   Height:   Ht Readings from Last 1 Encounters:  11/12/15 5\' 5"  (1.651 m)    Weight:   Wt Readings from Last 1 Encounters:  11/12/15 161 lb 9.6 oz (73.301 kg)    Ideal Body Weight:     BMI:  Body mass index is 26.89 kg/(m^2).  Estimated Nutritional Needs:   Kcal:  BEE 1320 kcals (IF 1.0-1.2, AF 1.3) [id number] kcals/d  Protein:  (1.2-1.5 g/kg) 88-110 g/d  Fluid:  (1068ml + UOP)  EDUCATION NEEDS:   No education needs identified at this time  [place]. Zenia Resides, [place], [place] (pager) Weekend/On-Call pager [phone])

## 2015-11-13 NOTE — Progress Notes (Signed)
[facility] [place] NAME: [name]    MR#:  [id number]  DATE OF BIRTH:  [birth year]  SUBJECTIVE: Admitted for hyperkalemia with nausea, vomiting. Had emergency hemodialysis yesterday. Potassium improved from 5.9-5.4. Today she complains of mild shortness of breath, abdominal pain. Continues to have poor by mouth intake.Marland Kitchen   CHIEF COMPLAINT:   Chief Complaint  Patient presents with  . Nausea  . Emesis    REVIEW OF SYSTEMS:    Review of Systems  Constitutional: Negative for fever and chills.  HENT: Negative for hearing loss.   Eyes: Negative for blurred vision, double vision and photophobia.  Respiratory: Positive for shortness of breath. Negative for cough and hemoptysis.   Cardiovascular: Negative for palpitations, orthopnea and leg swelling.  Gastrointestinal: Positive for nausea and abdominal pain. Negative for vomiting and diarrhea.  Genitourinary: Negative for dysuria and urgency.  Musculoskeletal: Negative for myalgias and neck pain.  Skin: Negative for rash.  Neurological: Negative for dizziness, focal weakness, seizures, weakness and headaches.  Psychiatric/Behavioral: Negative for memory loss. The patient does not have insomnia.     Nutrition:  Tolerating Diet: Tolerating PT:      DRUG ALLERGIES:   Allergies  Allergen Reactions  . Compazine [Prochlorperazine Edisylate] Anaphylaxis and Nausea And Vomiting  . Ace Inhibitors Swelling  . Ativan [Lorazepam] Other (See Comments)    Reaction:  Hallucinations and headaches  . Codeine Nausea And Vomiting  . Gabapentin Other (See Comments)    Reaction:  Unknown   . Losartan Other (See Comments)    Reaction:  Unknown   . Ondansetron Other (See Comments)    Reaction:  Unknown   . Prochlorperazine Other (See Comments)    Reaction:  Unknown   . Scopolamine Other (See Comments)    Reaction:  Unknown   . Zofran [Ondansetron Hcl] Other (See Comments)    Reaction:   hallucinations   . Oxycodone Anxiety  . Tape Rash    VITALS:  Blood pressure 156/65, pulse 82, temperature 98.6 F (37 C), temperature source Oral, resp. rate 20, height 5\' 5"  (1.651 m), weight 73.301 kg (161 lb 9.6 oz), SpO2 95 %.  PHYSICAL EXAMINATION:   Physical Exam  GENERAL:  55 y.o.-year-old patient lying in the bed with no acute distress. Chronically ill-appearing. EYES: Pupils equal, round, reactive to light and accommodation. No scleral icterus. Extraocular muscles intact.  HEENT: Head atraumatic, normocephalic. Oropharynx and nasopharynx clear.  NECK:  Supple, no jugular venous distention. No thyroid enlargement, no tenderness.  LUNGS: , Decreased breath sounds bilaterally, no wheezing, has diffuse bilateral crackles/ No use of accessory muscles of respiration.  CARDIOVASCULAR: S1, S2 normal. No murmurs, rubs, or gallops.  ABDOMEN: Soft, nontender, nondistended. Bowel sounds present. No organomegaly or mass.  EXTREMITIES: No pedal edema, cyanosis, or clubbing.  NEUROLOGIC: Cranial nerves II through XII are intact. Muscle strength 5/5 in all extremities. Sensation intact. Gait not checked.  PSYCHIATRIC: The patient is alert and oriented x 3.  SKIN: No obvious rash, lesion, or ulcer.    LABORATORY PANEL:   CBC  Recent Labs Lab 11/12/15 0556  WBC 8.7  HGB 12.2  HCT 38.1  PLT 160   ------------------------------------------------------------------------------------------------------------------  Chemistries   Recent Labs Lab 11/13/15 0744  NA 134*  K 5.4*  CL 96*  CO2 29  GLUCOSE 84  BUN 20  CREATININE 3.78*  CALCIUM 8.5*  AST 25  ALT 13*  ALKPHOS 137*  BILITOT 0.9   ------------------------------------------------------------------------------------------------------------------  Cardiac Enzymes  Recent Labs Lab 11/12/15 0622  TROPONINI <0.03    ------------------------------------------------------------------------------------------------------------------  RADIOLOGY:  Ct Abdomen Pelvis W Contrast  11/12/2015  CLINICAL DATA:  Nausea and vomiting for 3 days. Upper abdominal pain for 1 week. Chronic renal failure on dialysis. EXAM: CT ABDOMEN AND PELVIS WITH CONTRAST TECHNIQUE: Multidetector CT imaging of the abdomen and pelvis was performed using the standard protocol following bolus administration of intravenous contrast. CONTRAST:  156mL OMNIPAQUE IOHEXOL 300 MG/ML  SOLN COMPARISON:  07/22/2015 FINDINGS: Lower chest: Stable mild cardiomegaly. Moderate bilateral pleural effusions and bibasilar atelectasis show no significant change. Hepatobiliary: No masses identified. Periportal edema noted. Prior cholecystectomy noted. No evidence of biliary dilatation. Pancreas: No mass, inflammatory changes, or other significant abnormality. Spleen: Within normal limits in size and appearance. Adrenals/Urinary Tract: No adrenal masses identified. Bilateral diffuse renal parenchymal atrophy, consistent with end-stage renal disease. No evidence of hydronephrosis. Two enhancing lesions are seen in the upper pole the right kidney measuring 1.3 cm anteriorly on image 37/series 2 and 11 mm posteriorly on image 36/series 2. These are suspicious for small renal cell carcinomas. No evidence hydronephrosis. Unopacified urinary bladder is unremarkable in appearance. Stomach/Bowel: No evidence of obstruction, inflammatory process, or abnormal fluid collections. Sigmoid diverticulosis. No radiographic evidence of diverticulitis. Vascular/Lymphatic: No pathologically enlarged lymph nodes. No evidence of abdominal aortic aneurysm. Reproductive: Prior hysterectomy noted. Adnexal regions are unremarkable in appearance. Other: Diffuse body wall edema again demonstrated. Musculoskeletal:  No suspicious bone lesions identified. IMPRESSION: Colonic diverticulosis. No radiographic  evidence of diverticulitis or bowel obstruction. Stable moderate bilateral pleural effusions and diffuse body wall edema/anasarca. Diffuse bilateral renal atrophy, consistent with end-stage renal disease. Two small enhancing masses in upper pole of right kidney, suspicious for small renal cell carcinomas. Recommend urology referral and consider abdomen MRI without contrast for further evaluation. Electronically Signed   By: Earle Gell M.D.   On: 11/12/2015 11:44   Dg Chest Port 1 View  11/12/2015  CLINICAL DATA:  Nausea and vomiting since dialysis 3 days ago. Unable to finish dialysis at that time. Wheezing. Asthma. EXAM: PORTABLE CHEST 1 VIEW COMPARISON:  10/25/2015. FINDINGS: Stable left axillary and right arm stents. Mildly progressive enlargement of the cardiac silhouette with mildly increased prominence of the pulmonary vasculature and interstitial markings and increased size of bilateral pleural effusions and bibasilar atelectasis. Diffuse osteopenia. IMPRESSION: Mild worsening of changes of congestive heart failure. Electronically Signed   By: Claudie Revering M.D.   On: 11/12/2015 12:54     ASSESSMENT AND PLAN:   Active Problems:   Nausea   #1 intractable nausea and vomiting secondary to hyperkalemia, possibly gastroparesis.  #2 hyperkalemia ; improved with hemodialysis. For another   Session of hemodialysis today because of shortness of breath.  #3 gastroparesis secondary to diabetes mellitus type 2: Continue Phenergan, Reglan. #4. Diabetes mellitus type 2 with hypoglycemia: Resolved received D10 water. The sugar was as low as 10. Blood sugars were more than 50.  Abdominal pain likely secondary to gastritis continue PPIs. #5 acute on chronic diastolic heart failure in setting of fall ESRD: Continue dialysis. Coronary artery disease patient is on Plavix, Coreg, amiodarone continue them.  #7 chronic pancreatitis: Continue Creon  . All the records are reviewed and case discussed with  Care Management/Social Workerr. Management plans discussed with the patient, family and they are in agreement.  CODE STATUS:full  TOTAL TIME TAKING CARE OF THIS PATIENT: 25  minutes.   POSSIBLE D/C IN 1-2DAYS, DEPENDING ON CLINICAL CONDITION.  Epifanio Lesches M.D on 11/13/2015 at 10:32 AM  Between 7am to 6pm - Pager - [phone]  After 6pm go to [email or web address] - password [email or web address] [facility]  Office  [phone]  CC: Primary care physician; Ellamae Sia, MD

## 2015-11-14 ENCOUNTER — Inpatient Hospital Stay: Payer: Medicare Other

## 2015-11-14 ENCOUNTER — Observation Stay: Payer: Medicare Other

## 2015-11-14 DIAGNOSIS — J189 Pneumonia, unspecified organism: Secondary | ICD-10-CM | POA: Diagnosis not present

## 2015-11-14 DIAGNOSIS — N2889 Other specified disorders of kidney and ureter: Secondary | ICD-10-CM | POA: Diagnosis present

## 2015-11-14 DIAGNOSIS — F329 Major depressive disorder, single episode, unspecified: Secondary | ICD-10-CM | POA: Diagnosis present

## 2015-11-14 DIAGNOSIS — R110 Nausea: Secondary | ICD-10-CM | POA: Diagnosis not present

## 2015-11-14 DIAGNOSIS — I5043 Acute on chronic combined systolic (congestive) and diastolic (congestive) heart failure: Secondary | ICD-10-CM | POA: Diagnosis present

## 2015-11-14 DIAGNOSIS — L89152 Pressure ulcer of sacral region, stage 2: Secondary | ICD-10-CM | POA: Diagnosis not present

## 2015-11-14 DIAGNOSIS — I132 Hypertensive heart and chronic kidney disease with heart failure and with stage 5 chronic kidney disease, or end stage renal disease: Secondary | ICD-10-CM | POA: Diagnosis present

## 2015-11-14 DIAGNOSIS — E1122 Type 2 diabetes mellitus with diabetic chronic kidney disease: Secondary | ICD-10-CM | POA: Diagnosis present

## 2015-11-14 DIAGNOSIS — G9341 Metabolic encephalopathy: Secondary | ICD-10-CM | POA: Diagnosis not present

## 2015-11-14 DIAGNOSIS — R509 Fever, unspecified: Secondary | ICD-10-CM | POA: Diagnosis present

## 2015-11-14 DIAGNOSIS — Z833 Family history of diabetes mellitus: Secondary | ICD-10-CM | POA: Diagnosis not present

## 2015-11-14 DIAGNOSIS — R6521 Severe sepsis with septic shock: Secondary | ICD-10-CM | POA: Diagnosis not present

## 2015-11-14 DIAGNOSIS — E785 Hyperlipidemia, unspecified: Secondary | ICD-10-CM | POA: Diagnosis present

## 2015-11-14 DIAGNOSIS — J811 Chronic pulmonary edema: Secondary | ICD-10-CM | POA: Insufficient documentation

## 2015-11-14 DIAGNOSIS — G43A0 Cyclical vomiting, not intractable: Secondary | ICD-10-CM | POA: Diagnosis present

## 2015-11-14 DIAGNOSIS — E871 Hypo-osmolality and hyponatremia: Secondary | ICD-10-CM | POA: Diagnosis present

## 2015-11-14 DIAGNOSIS — I252 Old myocardial infarction: Secondary | ICD-10-CM | POA: Diagnosis not present

## 2015-11-14 DIAGNOSIS — J810 Acute pulmonary edema: Secondary | ICD-10-CM

## 2015-11-14 DIAGNOSIS — Z888 Allergy status to other drugs, medicaments and biological substances status: Secondary | ICD-10-CM | POA: Diagnosis not present

## 2015-11-14 DIAGNOSIS — D696 Thrombocytopenia, unspecified: Secondary | ICD-10-CM | POA: Diagnosis present

## 2015-11-14 DIAGNOSIS — Z992 Dependence on renal dialysis: Secondary | ICD-10-CM | POA: Diagnosis not present

## 2015-11-14 DIAGNOSIS — R0600 Dyspnea, unspecified: Secondary | ICD-10-CM | POA: Diagnosis not present

## 2015-11-14 DIAGNOSIS — F17210 Nicotine dependence, cigarettes, uncomplicated: Secondary | ICD-10-CM | POA: Diagnosis present

## 2015-11-14 DIAGNOSIS — F419 Anxiety disorder, unspecified: Secondary | ICD-10-CM | POA: Diagnosis present

## 2015-11-14 DIAGNOSIS — J9600 Acute respiratory failure, unspecified whether with hypoxia or hypercapnia: Secondary | ICD-10-CM | POA: Diagnosis not present

## 2015-11-14 DIAGNOSIS — J45909 Unspecified asthma, uncomplicated: Secondary | ICD-10-CM | POA: Diagnosis present

## 2015-11-14 DIAGNOSIS — Z4659 Encounter for fitting and adjustment of other gastrointestinal appliance and device: Secondary | ICD-10-CM | POA: Insufficient documentation

## 2015-11-14 DIAGNOSIS — N186 End stage renal disease: Secondary | ICD-10-CM | POA: Diagnosis present

## 2015-11-14 DIAGNOSIS — J470 Bronchiectasis with acute lower respiratory infection: Secondary | ICD-10-CM | POA: Diagnosis present

## 2015-11-14 DIAGNOSIS — Z885 Allergy status to narcotic agent status: Secondary | ICD-10-CM | POA: Diagnosis not present

## 2015-11-14 DIAGNOSIS — E875 Hyperkalemia: Secondary | ICD-10-CM | POA: Diagnosis present

## 2015-11-14 DIAGNOSIS — K861 Other chronic pancreatitis: Secondary | ICD-10-CM | POA: Diagnosis present

## 2015-11-14 DIAGNOSIS — A419 Sepsis, unspecified organism: Secondary | ICD-10-CM | POA: Diagnosis not present

## 2015-11-14 DIAGNOSIS — Z841 Family history of disorders of kidney and ureter: Secondary | ICD-10-CM | POA: Diagnosis not present

## 2015-11-14 DIAGNOSIS — I959 Hypotension, unspecified: Secondary | ICD-10-CM | POA: Diagnosis present

## 2015-11-14 DIAGNOSIS — E11649 Type 2 diabetes mellitus with hypoglycemia without coma: Secondary | ICD-10-CM | POA: Diagnosis present

## 2015-11-14 DIAGNOSIS — H6690 Otitis media, unspecified, unspecified ear: Secondary | ICD-10-CM | POA: Diagnosis present

## 2015-11-14 DIAGNOSIS — E1165 Type 2 diabetes mellitus with hyperglycemia: Secondary | ICD-10-CM | POA: Diagnosis not present

## 2015-11-14 DIAGNOSIS — Z7902 Long term (current) use of antithrombotics/antiplatelets: Secondary | ICD-10-CM | POA: Diagnosis not present

## 2015-11-14 DIAGNOSIS — J9601 Acute respiratory failure with hypoxia: Secondary | ICD-10-CM | POA: Diagnosis not present

## 2015-11-14 DIAGNOSIS — I429 Cardiomyopathy, unspecified: Secondary | ICD-10-CM | POA: Diagnosis present

## 2015-11-14 DIAGNOSIS — J690 Pneumonitis due to inhalation of food and vomit: Secondary | ICD-10-CM | POA: Diagnosis not present

## 2015-11-14 DIAGNOSIS — Z79899 Other long term (current) drug therapy: Secondary | ICD-10-CM | POA: Diagnosis not present

## 2015-11-14 DIAGNOSIS — D631 Anemia in chronic kidney disease: Secondary | ICD-10-CM | POA: Diagnosis present

## 2015-11-14 DIAGNOSIS — A047 Enterocolitis due to Clostridium difficile: Secondary | ICD-10-CM | POA: Diagnosis present

## 2015-11-14 DIAGNOSIS — J154 Pneumonia due to other streptococci: Secondary | ICD-10-CM | POA: Diagnosis present

## 2015-11-14 DIAGNOSIS — E1143 Type 2 diabetes mellitus with diabetic autonomic (poly)neuropathy: Secondary | ICD-10-CM | POA: Diagnosis present

## 2015-11-14 DIAGNOSIS — I2510 Atherosclerotic heart disease of native coronary artery without angina pectoris: Secondary | ICD-10-CM | POA: Diagnosis present

## 2015-11-14 DIAGNOSIS — E11319 Type 2 diabetes mellitus with unspecified diabetic retinopathy without macular edema: Secondary | ICD-10-CM | POA: Diagnosis present

## 2015-11-14 DIAGNOSIS — A403 Sepsis due to Streptococcus pneumoniae: Secondary | ICD-10-CM | POA: Diagnosis present

## 2015-11-14 DIAGNOSIS — K3184 Gastroparesis: Secondary | ICD-10-CM | POA: Diagnosis present

## 2015-11-14 DIAGNOSIS — N2581 Secondary hyperparathyroidism of renal origin: Secondary | ICD-10-CM | POA: Diagnosis present

## 2015-11-14 DIAGNOSIS — J9621 Acute and chronic respiratory failure with hypoxia: Secondary | ICD-10-CM | POA: Diagnosis not present

## 2015-11-14 DIAGNOSIS — K219 Gastro-esophageal reflux disease without esophagitis: Secondary | ICD-10-CM | POA: Diagnosis present

## 2015-11-14 DIAGNOSIS — R1310 Dysphagia, unspecified: Secondary | ICD-10-CM | POA: Diagnosis present

## 2015-11-14 DIAGNOSIS — E876 Hypokalemia: Secondary | ICD-10-CM | POA: Diagnosis present

## 2015-11-14 DIAGNOSIS — Z7984 Long term (current) use of oral hypoglycemic drugs: Secondary | ICD-10-CM | POA: Diagnosis not present

## 2015-11-14 DIAGNOSIS — Z515 Encounter for palliative care: Secondary | ICD-10-CM | POA: Diagnosis not present

## 2015-11-14 DIAGNOSIS — M1990 Unspecified osteoarthritis, unspecified site: Secondary | ICD-10-CM | POA: Diagnosis present

## 2015-11-14 DIAGNOSIS — A414 Sepsis due to anaerobes: Secondary | ICD-10-CM | POA: Diagnosis present

## 2015-11-14 DIAGNOSIS — N179 Acute kidney failure, unspecified: Secondary | ICD-10-CM | POA: Diagnosis present

## 2015-11-14 DIAGNOSIS — Z91048 Other nonmedicinal substance allergy status: Secondary | ICD-10-CM | POA: Diagnosis not present

## 2015-11-14 DIAGNOSIS — Z452 Encounter for adjustment and management of vascular access device: Secondary | ICD-10-CM | POA: Insufficient documentation

## 2015-11-14 DIAGNOSIS — J90 Pleural effusion, not elsewhere classified: Secondary | ICD-10-CM | POA: Diagnosis not present

## 2015-11-14 DIAGNOSIS — E440 Moderate protein-calorie malnutrition: Secondary | ICD-10-CM | POA: Diagnosis not present

## 2015-11-14 LAB — GLUCOSE, CAPILLARY
Glucose-Capillary: 101 mg/dL — ABNORMAL HIGH (ref 65–99)
Glucose-Capillary: 106 mg/dL — ABNORMAL HIGH (ref 65–99)
Glucose-Capillary: 128 mg/dL — ABNORMAL HIGH (ref 65–99)
Glucose-Capillary: 39 mg/dL — CL (ref 65–99)
Glucose-Capillary: 42 mg/dL — CL (ref 65–99)
Glucose-Capillary: 50 mg/dL — ABNORMAL LOW (ref 65–99)
Glucose-Capillary: 53 mg/dL — ABNORMAL LOW (ref 65–99)
Glucose-Capillary: 57 mg/dL — ABNORMAL LOW (ref 65–99)
Glucose-Capillary: 67 mg/dL (ref 65–99)
Glucose-Capillary: 69 mg/dL (ref 65–99)
Glucose-Capillary: 70 mg/dL (ref 65–99)
Glucose-Capillary: 70 mg/dL (ref 65–99)
Glucose-Capillary: 76 mg/dL (ref 65–99)
Glucose-Capillary: 77 mg/dL (ref 65–99)
Glucose-Capillary: 89 mg/dL (ref 65–99)
Glucose-Capillary: 89 mg/dL (ref 65–99)
Glucose-Capillary: 91 mg/dL (ref 65–99)
Glucose-Capillary: 91 mg/dL (ref 65–99)
Glucose-Capillary: 97 mg/dL (ref 65–99)

## 2015-11-14 LAB — CBC
HCT: 34.8 % — ABNORMAL LOW (ref 35.0–47.0)
Hemoglobin: 11 g/dL — ABNORMAL LOW (ref 12.0–16.0)
MCH: 27.9 pg (ref 26.0–34.0)
MCHC: 31.5 g/dL — ABNORMAL LOW (ref 32.0–36.0)
MCV: 88.5 fL (ref 80.0–100.0)
Platelets: 92 10*3/uL — ABNORMAL LOW (ref 150–440)
RBC: 3.93 MIL/uL (ref 3.80–5.20)
RDW: 20.1 % — ABNORMAL HIGH (ref 11.5–14.5)
WBC: 3 10*3/uL — ABNORMAL LOW (ref 3.6–11.0)

## 2015-11-14 LAB — EXPECTORATED SPUTUM ASSESSMENT W GRAM STAIN, RFLX TO RESP C

## 2015-11-14 LAB — BLOOD GAS, ARTERIAL
Acid-Base Excess: 5.8 mmol/L — ABNORMAL HIGH (ref 0.0–3.0)
Bicarbonate: 29.8 mEq/L — ABNORMAL HIGH (ref 21.0–28.0)
FIO2: 0.4
MECHVT: 450 mL
Mechanical Rate: 18
O2 Saturation: 93.9 %
PEEP: 5 cmH2O
Patient temperature: 37
RATE: 18 resp/min
pCO2 arterial: 40 mmHg (ref 32.0–48.0)
pH, Arterial: 7.48 — ABNORMAL HIGH (ref 7.350–7.450)
pO2, Arterial: 65 mmHg — ABNORMAL LOW (ref 83.0–108.0)

## 2015-11-14 LAB — INFLUENZA PANEL BY PCR (TYPE A & B)
H1N1 flu by pcr: NOT DETECTED
Influenza A By PCR: NEGATIVE
Influenza B By PCR: NEGATIVE

## 2015-11-14 MED ORDER — SEVELAMER CARBONATE 800 MG PO TABS
800.0000 mg | ORAL_TABLET | Freq: Three times a day (TID) | ORAL | Status: DC
  Administered 2015-11-15 (×2): 800 mg via NASOGASTRIC
  Filled 2015-11-14 (×2): qty 1

## 2015-11-14 MED ORDER — FENTANYL CITRATE (PF) 100 MCG/2ML IJ SOLN
100.0000 ug | Freq: Once | INTRAMUSCULAR | Status: AC
  Administered 2015-11-14: 100 ug via INTRAVENOUS

## 2015-11-14 MED ORDER — PIPERACILLIN-TAZOBACTAM 3.375 G IVPB
3.3750 g | Freq: Two times a day (BID) | INTRAVENOUS | Status: DC
  Administered 2015-11-14 – 2015-11-16 (×6): 3.375 g via INTRAVENOUS
  Filled 2015-11-14 (×8): qty 50

## 2015-11-14 MED ORDER — SUCCINYLCHOLINE CHLORIDE 20 MG/ML IJ SOLN
30.0000 mg | Freq: Once | INTRAMUSCULAR | Status: AC
  Administered 2015-11-14: 30 mg via INTRAVENOUS
  Filled 2015-11-14: qty 1.5

## 2015-11-14 MED ORDER — DEXTROSE 50 % IV SOLN
INTRAVENOUS | Status: AC
  Filled 2015-11-14: qty 50

## 2015-11-14 MED ORDER — VANCOMYCIN HCL IN DEXTROSE 750-5 MG/150ML-% IV SOLN
750.0000 mg | INTRAVENOUS | Status: DC | PRN
  Filled 2015-11-14: qty 150

## 2015-11-14 MED ORDER — PANTOPRAZOLE SODIUM 40 MG PO PACK
40.0000 mg | PACK | Freq: Every day | ORAL | Status: DC
Start: 2015-11-14 — End: 2015-11-14

## 2015-11-14 MED ORDER — FENTANYL CITRATE (PF) 100 MCG/2ML IJ SOLN
50.0000 ug | Freq: Once | INTRAMUSCULAR | Status: AC
  Administered 2015-11-14: 50 ug via INTRAVENOUS
  Filled 2015-11-14: qty 2

## 2015-11-14 MED ORDER — DEXTROSE 50 % IV SOLN
INTRAVENOUS | Status: AC
  Administered 2015-11-14: 50 mL via INTRAVENOUS
  Filled 2015-11-14: qty 50

## 2015-11-14 MED ORDER — ASPIRIN 81 MG PO CHEW: 81.0000 mg | CHEWABLE_TABLET | Freq: Every day | ORAL | Status: DC

## 2015-11-14 MED ORDER — SENNOSIDES-DOCUSATE SODIUM 8.6-50 MG PO TABS: 1.0000 | ORAL_TABLET | Freq: Every evening | ORAL | Status: DC | PRN

## 2015-11-14 MED ORDER — CHOLECALCIFEROL 10 MCG (400 UNIT) PO TABS
400.0000 [IU] | ORAL_TABLET | Freq: Every day | ORAL | Status: DC
  Administered 2015-11-14 – 2015-11-25 (×10): 400 [IU]
  Filled 2015-11-14 (×10): qty 1

## 2015-11-14 MED ORDER — VITAMIN B-12 1000 MCG PO TABS
1000.0000 ug | ORAL_TABLET | Freq: Every day | ORAL | Status: DC
  Administered 2015-11-15 – 2015-11-18 (×4): 1000 ug via NASOGASTRIC
  Filled 2015-11-14 (×4): qty 1

## 2015-11-14 MED ORDER — GLUCAGON HCL RDNA (DIAGNOSTIC) 1 MG IJ SOLR
1.0000 mg | Freq: Once | INTRAMUSCULAR | Status: AC
  Administered 2015-11-14: 1 mg via INTRAVENOUS
  Filled 2015-11-14: qty 1

## 2015-11-14 MED ORDER — HYDROCORTISONE NA SUCCINATE PF 100 MG IJ SOLR
50.0000 mg | Freq: Three times a day (TID) | INTRAMUSCULAR | Status: DC
  Administered 2015-11-14 – 2015-11-15 (×3): 50 mg via INTRAVENOUS
  Filled 2015-11-14 (×3): qty 2

## 2015-11-14 MED ORDER — ANTISEPTIC ORAL RINSE SOLUTION (CORINZ)
7.0000 mL | OROMUCOSAL | Status: DC
  Administered 2015-11-15 – 2015-11-22 (×58): 7 mL via OROMUCOSAL
  Filled 2015-11-14 (×83): qty 7

## 2015-11-14 MED ORDER — RENA-VITE PO TABS
1.0000 | ORAL_TABLET | Freq: Every day | ORAL | Status: DC
  Administered 2015-11-14 – 2015-11-16 (×3): 1 via ORAL
  Filled 2015-11-14 (×3): qty 1

## 2015-11-14 MED ORDER — AMIODARONE HCL 200 MG PO TABS
400.0000 mg | ORAL_TABLET | Freq: Every day | ORAL | Status: DC
  Administered 2015-11-15 – 2015-11-18 (×4): 400 mg via NASOGASTRIC
  Filled 2015-11-14 (×4): qty 2

## 2015-11-14 MED ORDER — METOCLOPRAMIDE HCL 5 MG/ML IJ SOLN
5.0000 mg | Freq: Four times a day (QID) | INTRAMUSCULAR | Status: DC | PRN
Start: 2015-11-14 — End: 2015-11-18
  Filled 2015-11-14: qty 2

## 2015-11-14 MED ORDER — PANTOPRAZOLE SODIUM 40 MG IV SOLR
40.0000 mg | INTRAVENOUS | Status: DC
  Administered 2015-11-14 – 2015-11-16 (×3): 40 mg via INTRAVENOUS
  Filled 2015-11-14 (×3): qty 40

## 2015-11-14 MED ORDER — DEXTROSE 50 % IV SOLN
INTRAVENOUS | Status: AC
  Administered 2015-11-14: 08:00:00
  Filled 2015-11-14: qty 100

## 2015-11-14 MED ORDER — MIDAZOLAM HCL 2 MG/2ML IJ SOLN
2.0000 mg | Freq: Once | INTRAMUSCULAR | Status: AC
  Administered 2015-11-14: 2 mg via INTRAVENOUS

## 2015-11-14 MED ORDER — FENTANYL 2500MCG IN NS 250ML (10MCG/ML) PREMIX INFUSION
25.0000 ug/h | INTRAVENOUS | Status: DC
  Administered 2015-11-14: 25 ug/h via INTRAVENOUS
  Filled 2015-11-14: qty 250

## 2015-11-14 MED ORDER — CITALOPRAM HYDROBROMIDE 20 MG PO TABS
20.0000 mg | ORAL_TABLET | Freq: Every day | ORAL | Status: DC
  Administered 2015-11-15 – 2015-11-18 (×4): 20 mg via NASOGASTRIC
  Filled 2015-11-14 (×4): qty 1

## 2015-11-14 MED ORDER — NEPRO/CARBSTEADY PO LIQD: 237.0000 mL | Freq: Two times a day (BID) | ORAL | Status: DC

## 2015-11-14 MED ORDER — CHOLECALCIFEROL 400 UNIT/ML PO LIQD
400.0000 [IU] | Freq: Every day | ORAL | Status: DC
  Filled 2015-11-14: qty 1

## 2015-11-14 MED ORDER — FENTANYL BOLUS VIA INFUSION
50.0000 ug | INTRAVENOUS | Status: DC | PRN
  Filled 2015-11-14: qty 50

## 2015-11-14 MED ORDER — MIDAZOLAM HCL 2 MG/2ML IJ SOLN
INTRAMUSCULAR | Status: AC
  Administered 2015-11-14: 13:00:00
  Filled 2015-11-14: qty 4

## 2015-11-14 MED ORDER — ATORVASTATIN CALCIUM 20 MG PO TABS
20.0000 mg | ORAL_TABLET | Freq: Every evening | ORAL | Status: DC
  Administered 2015-11-14 – 2015-11-17 (×4): 20 mg via NASOGASTRIC
  Filled 2015-11-14 (×4): qty 1

## 2015-11-14 MED ORDER — FENTANYL CITRATE (PF) 100 MCG/2ML IJ SOLN
INTRAMUSCULAR | Status: AC
  Administered 2015-11-14: 100 ug via INTRAVENOUS
  Filled 2015-11-14: qty 4

## 2015-11-14 MED ORDER — OXYCODONE HCL 5 MG PO TABS: 5.0000 mg | ORAL_TABLET | ORAL | Status: DC | PRN

## 2015-11-14 MED ORDER — IPRATROPIUM-ALBUTEROL 0.5-2.5 (3) MG/3ML IN SOLN
3.0000 mL | Freq: Four times a day (QID) | RESPIRATORY_TRACT | Status: DC
Start: 2015-11-14 — End: 2015-11-19
  Administered 2015-11-14 – 2015-11-19 (×21): 3 mL via RESPIRATORY_TRACT
  Filled 2015-11-14 (×21): qty 3

## 2015-11-14 MED ORDER — PROMETHAZINE HCL 6.25 MG/5ML PO SYRP
25.0000 mg | ORAL_SOLUTION | ORAL | Status: DC | PRN
  Filled 2015-11-14: qty 20

## 2015-11-14 MED ORDER — CHLORHEXIDINE GLUCONATE 0.12% ORAL RINSE (MEDLINE KIT)
15.0000 mL | Freq: Two times a day (BID) | OROMUCOSAL | Status: DC
  Administered 2015-11-14 – 2015-12-01 (×30): 15 mL via OROMUCOSAL
  Filled 2015-11-14 (×35): qty 15

## 2015-11-14 MED ORDER — CARVEDILOL 6.25 MG PO TABS: 6.2500 mg | ORAL_TABLET | Freq: Two times a day (BID) | ORAL | Status: DC

## 2015-11-14 MED ORDER — DEXTROSE 50 % IV SOLN
1.0000 | Freq: Once | INTRAVENOUS | Status: AC
  Administered 2015-11-14: 50 mL via INTRAVENOUS

## 2015-11-14 MED ORDER — VANCOMYCIN HCL 10 G IV SOLR
1750.0000 mg | Freq: Once | INTRAVENOUS | Status: DC
  Filled 2015-11-14: qty 1750

## 2015-11-14 MED ORDER — ALUM & MAG HYDROXIDE-SIMETH 200-200-20 MG/5ML PO SUSP
30.0000 mL | Freq: Four times a day (QID) | ORAL | Status: DC | PRN
  Administered 2015-11-22 – 2015-11-24 (×2): 30 mL
  Filled 2015-11-14 (×2): qty 30

## 2015-11-14 MED ORDER — CLOPIDOGREL BISULFATE 75 MG PO TABS
75.0000 mg | ORAL_TABLET | Freq: Every day | ORAL | Status: DC
  Administered 2015-11-15 – 2015-11-18 (×4): 75 mg via NASOGASTRIC
  Filled 2015-11-14 (×4): qty 1

## 2015-11-14 MED ORDER — NOREPINEPHRINE BITARTRATE 1 MG/ML IV SOLN
0.0000 ug/min | INTRAVENOUS | Status: DC
  Administered 2015-11-14: 5 ug/min via INTRAVENOUS
  Administered 2015-11-15: 20 ug/min via INTRAVENOUS
  Administered 2015-11-16: 7.04 ug/min via INTRAVENOUS
  Administered 2015-11-17: 4.053 ug/min via INTRAVENOUS
  Filled 2015-11-14 (×4): qty 16

## 2015-11-14 MED ORDER — MIDAZOLAM HCL 2 MG/2ML IJ SOLN: 2.0000 mg | INTRAMUSCULAR | Status: DC | PRN

## 2015-11-14 MED ORDER — ASPIRIN 81 MG PO CHEW
81.0000 mg | CHEWABLE_TABLET | Freq: Every day | ORAL | Status: DC
  Administered 2015-11-14 – 2015-11-18 (×5): 81 mg via NASOGASTRIC
  Filled 2015-11-14 (×5): qty 1

## 2015-11-14 MED ORDER — VANCOMYCIN HCL 10 G IV SOLR
1500.0000 mg | Freq: Once | INTRAVENOUS | Status: AC
  Administered 2015-11-14: 1500 mg via INTRAVENOUS
  Filled 2015-11-14: qty 1500

## 2015-11-14 MED ORDER — MIDAZOLAM HCL 2 MG/2ML IJ SOLN
2.0000 mg | INTRAMUSCULAR | Status: DC | PRN
  Administered 2015-11-17 – 2015-11-18 (×2): 2 mg via INTRAVENOUS
  Filled 2015-11-14 (×2): qty 2

## 2015-11-14 MED ORDER — DEXTROSE 50 % IV SOLN
25.0000 g | INTRAVENOUS | Status: DC | PRN
  Administered 2015-11-14: 25 g via INTRAVENOUS
  Administered 2015-11-18: 12.5 g via INTRAVENOUS
  Administered 2015-11-19 – 2015-11-27 (×4): 25 g via INTRAVENOUS
  Filled 2015-11-14 (×6): qty 50

## 2015-11-14 NOTE — Progress Notes (Addendum)
Patient intubed per Dr. Juanell Fairly for airway protection. Premedicated with 131mcg fent, 2 versed, and 30 succ.

## 2015-11-14 NOTE — Progress Notes (Signed)
Patient noted to have blood sugar of 34 and temp of 102 Axillary. Spoke with DR. Willis who gave new orders for an amp of D50 and to change IV fluids to D10 @ 50 ml/hr. Patient also received a Tylenol suppository. Patient was then transferred to ICU 14.

## 2015-11-14 NOTE — Care Management (Addendum)
Transferred to icu due to need for continuous bipap, blood sugar of 34 and temp > 102.  There is no physician documentation present regarding this transfer or suspected diagnosis.  Patient was observation- changed order to inpatient as she now meets inpatient criteria

## 2015-11-14 NOTE — Progress Notes (Signed)
Levo Started. Central line placement took multiple failed attempts per both Magdeline NP and Dr. Juanell Fairly. Levophed not administered until for certain correct placement due to multiple failed attempts.

## 2015-11-14 NOTE — Progress Notes (Signed)
[facility] NAME: [name]    MR#:  [id number]  DATE OF BIRTH:  [birth year]  SUBJECTIVE: Admitted for hyperkalemia with nausea, vomiting. Had emergency hemodialysis   On Admission. Potassium improved from 5.9-5.4.  Transfer to ICU secondary to respiratory distress, lethargic, fever. Now she is on BiPAP , she is hypotensive, persistently hypoglycemic. So she is on D10 water at 75 mg however , BiPAP. Patient is getting flu testing, started empirically on antibiotics. She is minimally responsive..   CHIEF COMPLAINT:   Chief Complaint  Patient presents with  . Nausea  . Emesis    REVIEW OF SYSTEMS:    Review of Systems  Unable to perform ROS: critical illness  Constitutional: Negative for fever and chills.  HENT: Negative for hearing loss.   Eyes: Negative for blurred vision, double vision and photophobia.  Respiratory: Negative for cough, hemoptysis and shortness of breath.   Cardiovascular: Negative for palpitations, orthopnea and leg swelling.  Gastrointestinal: Negative for nausea, vomiting, abdominal pain and diarrhea.  Genitourinary: Negative for dysuria and urgency.  Musculoskeletal: Negative for myalgias and neck pain.  Skin: Negative for rash.  Neurological: Negative for dizziness, focal weakness, seizures, weakness and headaches.        Lethargic.  Psychiatric/Behavioral: Negative for memory loss. The patient does not have insomnia.     Nutrition:  Tolerating Diet: Tolerating PT:      DRUG ALLERGIES:   Allergies  Allergen Reactions  . Compazine [Prochlorperazine Edisylate] Anaphylaxis and Nausea And Vomiting  . Ace Inhibitors Swelling  . Ativan [Lorazepam] Other (See Comments)    Reaction:  Hallucinations and headaches  . Codeine Nausea And Vomiting  . Gabapentin Other (See Comments)    Reaction:  Unknown   . Losartan Other (See Comments)    Reaction:  Unknown   . Ondansetron Other (See Comments)     Reaction:  Unknown   . Prochlorperazine Other (See Comments)    Reaction:  Unknown   . Scopolamine Other (See Comments)    Reaction:  Unknown   . Zofran [Ondansetron Hcl] Other (See Comments)    Reaction:  hallucinations   . Oxycodone Anxiety  . Tape Rash    VITALS:  Blood pressure 132/59, pulse 80, temperature 98.4 F (36.9 C), temperature source Axillary, resp. rate 32, height 5\' 5"  (1.651 m), weight 73.301 kg (161 lb 9.6 oz), SpO2 98 %.  PHYSICAL EXAMINATION:   Physical Exam  GENERAL:  55 y.o.-year-old patient lying in the bed  On  BiPAP, chronically ill-appearing. EYES: Pupils equal, round, reactive to light and accommodation. No scleral icterus. Extraocular muscles intact.  HEENT: Head atraumatic, normocephalic. Oropharynx and nasopharynx clear.  NECK:  Supple, no jugular venous distention. No thyroid enlargement, no tenderness.  LUNGS: , Decreased breath sounds bilaterally,  has diffuse bilateral crackles/ No use of accessory muscles of respiration.  CARDIOVASCULAR: S1, S2 normal. No murmurs, rubs, or gallops.  ABDOMEN: Soft, nontender, nondistended. Bowel sounds present. No organomegaly or mass.  EXTREMITIES: No pedal edema, cyanosis, or clubbing.  NEUROLOGIC:  Unable to do full neurological exam secondary to lethargy .PSYCHIATRIC:lethargic.Marland Kitchen  SKIN: No obvious rash, lesion, or ulcer.    LABORATORY PANEL:   CBC  Recent Labs Lab 11/14/15 0053  WBC 3.0*  HGB 11.0*  HCT 34.8*  PLT 92*   ------------------------------------------------------------------------------------------------------------------  Chemistries   Recent Labs Lab 11/13/15 1425  NA 133*  K 5.0  CL 93*  CO2 32  GLUCOSE  78  BUN 22*  CREATININE 4.13*  CALCIUM 8.4*  AST 20  ALT 11*  ALKPHOS 144*  BILITOT 1.1   ------------------------------------------------------------------------------------------------------------------  Cardiac Enzymes  Recent Labs Lab 11/12/15 0622  TROPONINI  <0.03   ------------------------------------------------------------------------------------------------------------------  RADIOLOGY:  Dg Chest 1 View  11/14/2015  CLINICAL DATA:  Pulmonary edema.  History of CHF and asthma. EXAM: CHEST 1 VIEW COMPARISON:  Portable chest x-ray of November 13, 2015 FINDINGS: The lungs remain hypoinflated. Moderate-sized bilateral pleural effusions remain. The retrocardiac region on the left remains dense. The cardiac silhouette remains enlarged and the pulmonary vascularity remains engorged. The mediastinum is normal in width. The observed bony thorax is unremarkable. A vascular graft is present in the right antecubital and left axillary regions. IMPRESSION: Congestive heart failure with pulmonary interstitial edema and bilateral pleural effusions. There has not been significant interval change since yesterday's study. Density at both lung bases may reflect atelectasis or pneumonia. Electronically Signed   By: David  Martinique M.D.   On: 11/14/2015 08:55   Dg Chest 1 View  11/14/2015  CLINICAL DATA:  End-stage renal disease and anemia EXAM: CHEST 1 VIEW COMPARISON:  November 12, 2015 FINDINGS: There is cardiomegaly with pulmonary venous hypertension. There are bilateral pleural effusions. There is mild bibasilar interstitial edema. There is atelectatic change in the lung bases as well. There is a left axillary region stent. IMPRESSION: Findings consistent with congestive heart failure. Bibasilar atelectatic change; superimposed early pneumonia in the bases cannot be excluded radiographically. Electronically Signed   By: Lowella Grip III M.D.   On: 11/14/2015 08:52   Dg Chest Port 1 View  11/12/2015  CLINICAL DATA:  Nausea and vomiting since dialysis 3 days ago. Unable to finish dialysis at that time. Wheezing. Asthma. EXAM: PORTABLE CHEST 1 VIEW COMPARISON:  10/25/2015. FINDINGS: Stable left axillary and right arm stents. Mildly progressive enlargement of the cardiac  silhouette with mildly increased prominence of the pulmonary vasculature and interstitial markings and increased size of bilateral pleural effusions and bibasilar atelectasis. Diffuse osteopenia. IMPRESSION: Mild worsening of changes of congestive heart failure. Electronically Signed   By: Claudie Revering M.D.   On: 11/12/2015 12:54     ASSESSMENT AND PLAN:   Active Problems:   Nausea   High temperature  sepsis with lethargy and respiratory distress  To ICU for BiPAP support, start empiric antibiotics with Zosyn for possible aspiration , use of pressors if needed and BP less than 90 to keep map around 60. Follow blood cultures, urine cultures, chest x-ray. #1 intractable nausea and vomiting secondary to hyperkalemia, possibly gastroparesis.;imprpved  #2 hyperkalemia ; improved with hemodialysis. #3 gastroparesis secondary to diabetes mellitus type 2: Continue Phenergan, Reglan. #4. Diabetes mellitus type 2 with hypoglycemia:   persistently hypoglycemic secondary to sepsis: Continue D10 water at 75 mg normal. Appreciate endocrinology follow-up #5 acute on chronic diastolic heart failure in setting of fall ESRD: Continue dialysis. Coronary artery disease patient is on Plavix, Coreg, amiodarone continue them.  #7 chronic pancreatitis: Continue Creon   8 hypotension likely due to sepsis. Patient blood pressure is borderline.. Hold Coreg,lasix.  in due to fever check the flu test also. All the records are reviewed and case discussed with Care Management/Social Workerr. Management plans discussed with the patient, family and they are in agreement.  CODE STATUS:full  TOTAL TIME TAKING CARE OF THIS PATIENT: the 36 minutes of critical care  condition critical ;high risk for cardiac arrest  Discussed with patient's son at bedside. At 15  minutes spent on discussion in addition to 36 minutes of     Shandy Vi M.D on 11/14/2015 at 12:29 PM  Between 7am to 6pm - Pager -  [phone]  After 6pm go to [email or web address] - password [email or web address] [facility]  Office  [phone]  CC: Primary care physician; Ellamae Sia, MD

## 2015-11-14 NOTE — Consult Note (Signed)
PULMONARY / CRITICAL CARE MEDICINE   Name: LATRINA MARCONE MRN: [id number] DOB: [birth year]    ADMISSION DATE:  11/12/2015   CONSULTATION DATE:  11/14/15  REFERRING MD:  Dr Vianne Bulls  CHIEF COMPLAINT: Hyperkalemia, nausea, vomiting and altered mental status  HISTORY OF PRESENT ILLNESS:   This is a 55 yo AA female with a PMH of ESRD on dialysis, type 2 DM, hypertension, COPD, CHF and CAD admitted with hyperkalemia, dyspnea, nausea, and vomiting. She had emergency hemodialysis on 02/21 for a potassium level of 5.9 with 3L of fluid removed.Her CXR showed bilateral pleural effusions and bilateral lower lobe opacities. Last night(02/21) she became hypoxic, less responsive, hypoglycemic and febrile. She was transferred to the ICU and started on BiPAP. This morning patient became unresponsive and hypotensive necessitating emergent intubation. PCCM was consulted for further management. She is now on pressors, dextrose for hypoglycemia and broad spectrum antibiotics.  Of note, patient had thick dry copious secretions in her airway during intubation. She was exposed to her grand daughter who had strep prior to admission. She is negative for influenza   PAST MEDICAL HISTORY :  She  has a past medical history of Asthma; Collagen vascular disease ([place]); Diabetes mellitus without complication ([place]); Hypertension; Coronary artery disease; Diabetic neuropathy ([place]); ESRD (end stage renal disease) on dialysis ([place]); GERD (gastroesophageal reflux disease); pancreatitis (2015); Myocardial infarction ([place]); Pneumonia; Mitral regurgitation; COPD (chronic obstructive pulmonary disease) ([facility]); chronic diastolic CHF (123XX123); dialysis (2006); Depression; Anxiety; Arthritis; Headache; Anemia; and Renal insufficiency.  PAST SURGICAL HISTORY: She  has past surgical history that includes Cholecystectomy; Appendectomy; Abdominal hysterectomy; Dialysis fistula creation (Left); Esophagogastroduodenoscopy (N/A, 03/08/2015);  Cardiac catheterization (Left, 07/26/2015); Eye surgery; and Fecal transplant (N/A, 08/23/2015).  Allergies  Allergen Reactions  . Compazine [Prochlorperazine Edisylate] Anaphylaxis and Nausea And Vomiting  . Ace Inhibitors Swelling  . Ativan [Lorazepam] Other (See Comments)    Reaction:  Hallucinations and headaches  . Codeine Nausea And Vomiting  . Gabapentin Other (See Comments)    Reaction:  Unknown   . Losartan Other (See Comments)    Reaction:  Unknown   . Ondansetron Other (See Comments)    Reaction:  Unknown   . Prochlorperazine Other (See Comments)    Reaction:  Unknown   . Scopolamine Other (See Comments)    Reaction:  Unknown   . Zofran [Ondansetron Hcl] Other (See Comments)    Reaction:  hallucinations   . Oxycodone Anxiety  . Tape Rash    No current facility-administered medications on file prior to encounter.   Current Outpatient Prescriptions on File Prior to Encounter  Medication Sig  . albuterol (PROVENTIL) (2.5 MG/3ML) 0.083% nebulizer solution Take 2.5 mg by nebulization every 4 (four) hours as needed for wheezing or shortness of breath.  Marland Kitchen amLODipine (NORVASC) 10 MG tablet Take 1 tablet by mouth daily.  Marland Kitchen aspirin EC 81 MG tablet Take 81 mg by mouth daily.  Marland Kitchen atorvastatin (LIPITOR) 20 MG tablet Take 20 mg by mouth every evening.  Marland Kitchen b complex-vitamin c-folic acid (NEPHRO-VITE) 0.8 MG TABS tablet Take 1 tablet by mouth daily.  . carvedilol (COREG) 25 MG tablet Take 25 mg by mouth 2 (two) times daily with a meal.  . cetirizine (ZYRTEC) 10 MG tablet Take 10 mg by mouth daily.  . Cholecalciferol (VITAMIN D3) 400 UNITS tablet Take 400 Units by mouth daily.  . citalopram (CELEXA) 20 MG tablet Take 20 mg by mouth daily.  . clopidogrel (PLAVIX) 75 MG tablet Take 75 mg  by mouth daily.  Marland Kitchen glucose 4 GM chewable tablet Chew 3-4 tablets by mouth as needed for low blood sugar (if pts blood sugar is less than 60).  Marland Kitchen HYDROcodone-acetaminophen (NORCO/VICODIN) 5-325 MG tablet  Take 1 tablet by mouth every 6 (six) hours as needed for severe pain. (Patient taking differently: Take 1 tablet by mouth every 8 (eight) hours as needed for severe pain. )  . lipase/protease/amylase (CREON) 12000 UNITS CPEP capsule Take 12,000 Units by mouth 3 (three) times daily with meals.   . metoCLOPramide (REGLAN) 5 MG tablet Take 1 tablet (5 mg total) by mouth 4 (four) times daily -  before meals and at bedtime.  . nitroGLYCERIN (NITROSTAT) 0.4 MG SL tablet Place 0.4 mg under the tongue every 5 (five) minutes x 3 doses as needed for chest pain. *Max 3 doses per episode*  . pregabalin (LYRICA) 25 MG capsule Take 25 mg by mouth 2 (two) times daily as needed (for leg pain).  . promethazine (PHENERGAN) 12.5 MG tablet 1-2 tablets by mouth every 8 hours as needed for nausea and vomiting (Patient taking differently: 12.5 mg every 6 (six) hours as needed for nausea or vomiting. 1-2 tablets by mouth every 8 hours as needed for nausea and vomiting)  . promethazine (PHENERGAN) 25 MG suppository Place 1 suppository (25 mg total) rectally every 6 (six) hours as needed for nausea. (Patient taking differently: Place 25 mg rectally every 8 (eight) hours as needed for nausea. )  . promethazine (PHENERGAN) 25 MG tablet Take 1 tablet (25 mg total) by mouth every 6 (six) hours as needed for nausea or vomiting.  . sevelamer carbonate (RENVELA) 800 MG tablet Take 800 mg by mouth 3 (three) times daily with meals.     FAMILY HISTORY:  Her has no family status information on file.   SOCIAL HISTORY: She  reports that she has been smoking Cigarettes.  She has been smoking about 0.50 packs per day. She has never used smokeless tobacco. She reports that she does not drink alcohol or use illicit drugs.  REVIEW OF SYSTEMS:   Unable to obtain due to patient's metal status  SUBJECTIVE:    VITAL SIGNS: BP 132/59 mmHg  Pulse 80  Temp(Src) 98.4 F (36.9 C) (Axillary)  Resp 32  Ht 5\' 5"  (1.651 m)  Wt 161 lb 9.6 oz  (73.301 kg)  BMI 26.89 kg/m2  SpO2 96%  HEMODYNAMICS:    VENTILATOR SETTINGS: Vent Mode:  [-]  FiO2 (%):  [35 %-40 %] 40 % PEEP:  [5 cmH20] 5 cmH20  INTAKE / OUTPUT: I/O last 3 completed shifts: In: 450 [I.V.:450] Out: 3000 [Other:3000]  PHYSICAL EXAMINATION: General: Acutely ill looking Neuro:  Unresponsive to voice, withdraws to noxious stimulus HEENT: PERRLA, ETT, oral mucosa dry Cardiovascular:  RRR, S1/S1, +2 PULSES; no edema Lungs: Bilateral airflow, +rhonchi and rales bilaterally Abdomen: Soft, NT/ND, normal bowel sounds Musculoskeletal:  No joint deformities Skin: Warm, dry  LABS:  BMET  Recent Labs Lab 11/12/15 0622 11/13/15 0744 11/13/15 1425  NA 134* 134* 133*  K 5.9* 5.4* 5.0  CL 95* 96* 93*  CO2 30 29 32  BUN 38* 20 22*  CREATININE 5.19* 3.78* 4.13*  GLUCOSE 62* 84 78    Electrolytes  Recent Labs Lab 11/12/15 0622 11/13/15 0744 11/13/15 1425  CALCIUM 9.4 8.5* 8.4*    CBC  Recent Labs Lab 11/09/15 1131 11/12/15 0556 11/14/15 0053  WBC 5.2 8.7 3.0*  HGB 12.1 12.2 11.0*  HCT 38.3 38.1  34.8*  PLT 190 160 92*    Coag's No results for input(s): APTT, INR in the last 168 hours.  Sepsis Markers No results for input(s): LATICACIDVEN, PROCALCITON, O2SATVEN in the last 168 hours.  ABG  Recent Labs Lab 11/13/15 2118 11/14/15 1415  PHART 7.44 7.48*  PCO2ART 49* 40  PO2ART 73* 65*    Liver Enzymes  Recent Labs Lab 11/12/15 0622 11/13/15 0744 11/13/15 1425  AST 18 25 20   ALT 12* 13* 11*  ALKPHOS 125 137* 144*  BILITOT 1.0 0.9 1.1  ALBUMIN 3.2* 3.0* 3.1*    Cardiac Enzymes  Recent Labs Lab 11/12/15 0622  TROPONINI <0.03    Glucose  Recent Labs Lab 11/14/15 0913 11/14/15 0959 11/14/15 1018 11/14/15 1110 11/14/15 1256 11/14/15 1405  GLUCAP 70 53* 128* 97 67 76    Imaging Dg Chest 1 View  11/14/2015  CLINICAL DATA:  Pulmonary edema.  History of CHF and asthma. EXAM: CHEST 1 VIEW COMPARISON:  Portable  chest x-ray of November 13, 2015 FINDINGS: The lungs remain hypoinflated. Moderate-sized bilateral pleural effusions remain. The retrocardiac region on the left remains dense. The cardiac silhouette remains enlarged and the pulmonary vascularity remains engorged. The mediastinum is normal in width. The observed bony thorax is unremarkable. A vascular graft is present in the right antecubital and left axillary regions. IMPRESSION: Congestive heart failure with pulmonary interstitial edema and bilateral pleural effusions. There has not been significant interval change since yesterday's study. Density at both lung bases may reflect atelectasis or pneumonia. Electronically Signed   By: David  Martinique M.D.   On: 11/14/2015 08:55   Dg Chest 1 View  11/14/2015  CLINICAL DATA:  End-stage renal disease and anemia EXAM: CHEST 1 VIEW COMPARISON:  November 12, 2015 FINDINGS: There is cardiomegaly with pulmonary venous hypertension. There are bilateral pleural effusions. There is mild bibasilar interstitial edema. There is atelectatic change in the lung bases as well. There is a left axillary region stent. IMPRESSION: Findings consistent with congestive heart failure. Bibasilar atelectatic change; superimposed early pneumonia in the bases cannot be excluded radiographically. Electronically Signed   By: Lowella Grip III M.D.   On: 11/14/2015 08:52   Dg Abd 1 View  11/14/2015  CLINICAL DATA:  OG tube placement. EXAM: ABDOMEN - 1 VIEW COMPARISON:  None. FINDINGS: OG tube is in place in good position with the side-port in the stomach. IMPRESSION: As above. Electronically Signed   By: Inge Rise M.D.   On: 11/14/2015 13:23   Dg Chest Port 1 View  11/14/2015  CLINICAL DATA:  Status post central line placement EXAM: PORTABLE CHEST 1 VIEW COMPARISON:  11/14/2015 FINDINGS: Cardiac shadow is mildly enlarged. Bilateral pleural effusions and likely bibasilar infiltrate is again identified. Changes consistent with prior  stenting in both upper extremities is seen. Additionally the left-sided stenting extends centrally into the subclavian vessel. A new left subclavian central venous line is noted with catheter tip in the superior right atrium. No pneumothorax is noted. Endotracheal tube and nasogastric catheter are noted in satisfactory position. The endotracheal tube is approximately 5 cm above the carina. No bony abnormality is seen. IMPRESSION: Status post left central venous line without pneumothorax. Interval placement of endotracheal tube and nasogastric catheter. Bibasilar changes stable from the prior exam. Electronically Signed   By: Inez Catalina M.D.   On: 11/14/2015 13:27     STUDIES:  Last Echo 09/2015>EF 40%, Left ventricular dysfunction/hypertrophy, systolic and diastolic function(grade 1), moderate MR, severe TR and moderate  pleural effusions  CULTURES: Blood 02/22> Urine 02/22> Sputum 02/22<  ANTIBIOTICS: Zosyn 02/22>> Vancomycin 02/22>>  SIGNIFICANT EVENTS: 02/20>Admitted 02/21>Transferred to ICU 02/22>Intubated  LINES/TUBES: Left subclavian TLC 02/22>> ETT 02/22>>  DISCUSSION: 55 YO female with ESRD on dialysis presenting with acute hypoxic respiratory failure secondary to bilateral pneumonia-aspiration versus bacteria; septic shock and severe hypoglycemia  ASSESSMENT / PLAN:  PULMONARY A: Acute hypoxic respiratory failure secondary to pneumonia and pulmonary edema Bacteria versus aspiration pneumonia H/O COPD P:   -Full vent support -VAP prevention -Daily CXR -ABG post-intubation and in am -Empiric antibiotics as above -Bronchodilators -IV steroids  CARDIOVASCULAR A:  Septic shock Systolic and diastolic heart failure H/o CAD H/o Hypertension P:  -Levophed gtt and titrate to MAP goal of >65 -Hold antihypertensive 2/2 shock -Hemodynamics per ICU protocol -Continue plavix  RENAL A:   ESRD on dialysis Hyperkalemia P:   -Nephrology following -Dialysis per  nephrology -Monitor and correct electrolyte imbalances  GASTROINTESTINAL A:   Nausea/vomiting P:   -OGT to intermittent suction -Antiemetics prn -PPI for GI prophylaxis -TFs as tolerated  HEMATOLOGIC A:   Anemia of chronic disease P:  -Monitor H/H and transfuse per ICU protocol  INFECTIOUS A:   Sepsis-likely source respiratory HCAP versus aspiration pneumonia P:   -Empiric antibiotics -f/u Cultures  ENDOCRINE A:   Refractory hypoglycemia Type 2 DM P:   -Blood glucose monitoring q4h  -No sliding scale coverage for now -D10W infusion at 78ml/hr  NEUROLOGIC A:   Acute metabolic encephalopathy P:   RASS goal: -1 -Fentanyl and versed for vent sedation   FAMILY  - Updates: Family at bedside; updated on patient's condition  - Inter-disciplinary family meet or Palliative Care meeting due by 02/30   Best Practice: Code Status:  Full. Diet: NPO GI prophylaxis:  PPI. VTE prophylaxis:  SCD's / heparin.  [facility]dal[facility] ANP-BC Pulmonary and [place] Pager [phone] 11/14/2015, 2:56 PM  Patient was seen and examined with the nurse practitioner, I agree with her assessment and plan, I was present for the history, physical examination and formulated the assessment and plan.  Critical Care Attestation.  I have personally obtained a history, examined the patient, evaluated laboratory and imaging results, formulated the assessment and plan and placed orders. The Patient requires high complexity decision making for assessment and support, frequent evaluation and titration of therapies, application of advanced monitoring technologies and extensive interpretation of multiple databases. The patient has critical illness that could lead imminently to failure of 1 or more organ systems and requires the highest level of physician preparedness to intervene.  Critical Care Time devoted to patient care services described in this note is 35  minutes and is exclusive of time spent in procedures.  Marda Stalker, M.D.

## 2015-11-14 NOTE — Progress Notes (Signed)
Central Venous Catheter Placement: Indication: Patient receiving vesicant or irritant drug.;  Patient has limited or no vascular access.   Consent:emergent.   Risks and benefits explained in detail including risk of infection, bleeding, respiratory failure and death..   Hand washing performed prior to starting the procedure.   Procedure: An active timeout was performed and correct patient, name, & ID confirmed.  After explaining risk and benefits, patient was positioned correctly for central venous access. Patient was prepped using strict sterile technique including chlorohexadine preps, sterile drape, sterile gown and sterile gloves.  The area was prepped, draped and anesthetized in the usual sterile manner. Patient comfort was obtained.  A triple lumen catheter was placed in left subclavian Vein There was good blood return, catheter caps were placed on lumens, catheter flushed easily, the line was secured and a sterile dressing and BIO-PATCH applied.   Ultrasound was used to visualize vasculature and guidance of needle.   Number of Attempts: 2, initial attempt was made on the left internal jugular, this could not be cannulated due to the small caliber of the vessel. Complications:none Estimated Blood Loss: 20 mL Chest Radiograph indicated and ordered.   Operator: Marda Stalker, M.D.   Marda Stalker, M.D.  Pulmonary & Critical Care Medicine

## 2015-11-14 NOTE — Progress Notes (Signed)
Dr. Juanell Fairly and NP [place] attempting to place central line. Blood pressure consistently low however both current IV's look infiltrated and do not have blood return. Cannot administer levo until line in place.

## 2015-11-14 NOTE — Progress Notes (Signed)
Spoke with Dr. Jannifer Franklin about patient's blood sugar not getting above 76 since admission to the ICU despite several amps of D50, D10 running at 20mL/hr, and 1mg  of glucagon.  Labs and a chest xray ordered.  CBG's changed to every 1 hour and continue to use D50 according to the hypoglycemia protocol.  Patient still lethargic but will open eyes and nod appropriately, vital signs are stable.  Will continue to monitor and treat blood sugars accordingly.

## 2015-11-14 NOTE — Progress Notes (Signed)
Subjective:  Patient known to our practice from outpatient dialysis 3 L was removed yesterday Now has Fever T max 102.7 Requiring BiPAP Opens eyes but not following commands   Objective:  Vital signs in last 24 hours:  Temp:  [98.4 F (36.9 C)-102.7 F (39.3 C)] 98.4 F (36.9 C) (02/22 0400) Pulse Rate:  [80-92] 80 (02/22 0400) Resp:  [16-38] 32 (02/22 0844) BP: (124-196)/(49-115) 132/59 mmHg (02/22 0400) SpO2:  [90 %-100 %] 98 % (02/22 0844) FiO2 (%):  [35 %] 35 % (02/22 0844)  Weight change:  Filed Weights   11/12/15 1800 11/12/15 2115 11/12/15 2208  Weight: 76 kg (167 lb 8.8 oz) 76 kg (167 lb 8.8 oz) 73.301 kg (161 lb 9.6 oz)    Intake/Output:    Intake/Output Summary (Last 24 hours) at 11/14/15 0914 Last data filed at 11/14/15 0600  Gross per 24 hour  Intake    450 ml  Output   3000 ml  Net  -2550 ml     Physical Exam: General: Critically ill appearing  HEENT Anicteric, dry oral mucus membranes  Neck supple  Pulm/lungs Diffuse crackles b/l, wheezing  CVS/Heart No rub  Abdomen:  Soft, Non tender  Extremities: + dependent edema  Neurologic: Lethargic, not following commands  Skin: No acute rashes  Access: AVF       Basic Metabolic Panel:   Recent Labs Lab 11/09/15 1328 11/12/15 0622 11/13/15 0744 11/13/15 1425  NA 139 134* 134* 133*  K 4.6 5.9* 5.4* 5.0  CL 99* 95* 96* 93*  CO2 30 30 29  32  GLUCOSE 36* 62* 84 78  BUN 19 38* 20 22*  CREATININE 3.02* 5.19* 3.78* 4.13*  CALCIUM 8.4* 9.4 8.5* 8.4*     CBC:  Recent Labs Lab 11/09/15 1131 11/12/15 0556 11/14/15 0053  WBC 5.2 8.7 3.0*  HGB 12.1 12.2 11.0*  HCT 38.3 38.1 34.8*  MCV 86.4 87.3 88.5  PLT 190 160 92*      Microbiology:  Recent Results (from the past 720 hour(s))  MRSA PCR Screening     Status: None   Collection Time: 11/13/15  9:46 PM  Result Value Ref Range Status   MRSA by PCR NEGATIVE NEGATIVE Final    Comment:        The GeneXpert MRSA Assay (FDA approved  for NASAL specimens only), is one component of a comprehensive MRSA colonization surveillance program. It is not intended to diagnose MRSA infection nor to guide or monitor treatment for MRSA infections.     Coagulation Studies: No results for input(s): LABPROT, INR in the last 72 hours.  Urinalysis: No results for input(s): COLORURINE, LABSPEC, PHURINE, GLUCOSEU, HGBUR, BILIRUBINUR, KETONESUR, PROTEINUR, UROBILINOGEN, NITRITE, LEUKOCYTESUR in the last 72 hours.  Invalid input(s): APPERANCEUR    Imaging: Dg Chest 1 View  11/14/2015  CLINICAL DATA:  Pulmonary edema.  History of CHF and asthma. EXAM: CHEST 1 VIEW COMPARISON:  Portable chest x-ray of November 13, 2015 FINDINGS: The lungs remain hypoinflated. Moderate-sized bilateral pleural effusions remain. The retrocardiac region on the left remains dense. The cardiac silhouette remains enlarged and the pulmonary vascularity remains engorged. The mediastinum is normal in width. The observed bony thorax is unremarkable. A vascular graft is present in the right antecubital and left axillary regions. IMPRESSION: Congestive heart failure with pulmonary interstitial edema and bilateral pleural effusions. There has not been significant interval change since yesterday's study. Density at both lung bases may reflect atelectasis or pneumonia. Electronically Signed   By: Shanon Brow  [place] M.D.   On: 11/14/2015 08:55   Dg Chest 1 View  11/14/2015  CLINICAL DATA:  End-stage renal disease and anemia EXAM: CHEST 1 VIEW COMPARISON:  November 12, 2015 FINDINGS: There is cardiomegaly with pulmonary venous hypertension. There are bilateral pleural effusions. There is mild bibasilar interstitial edema. There is atelectatic change in the lung bases as well. There is a left axillary region stent. IMPRESSION: Findings consistent with congestive heart failure. Bibasilar atelectatic change; superimposed early pneumonia in the bases cannot be excluded radiographically.  Electronically Signed   By: Lowella Grip III M.D.   On: 11/14/2015 08:52   Ct Abdomen Pelvis W Contrast  11/12/2015  CLINICAL DATA:  Nausea and vomiting for 3 days. Upper abdominal pain for 1 week. Chronic renal failure on dialysis. EXAM: CT ABDOMEN AND PELVIS WITH CONTRAST TECHNIQUE: Multidetector CT imaging of the abdomen and pelvis was performed using the standard protocol following bolus administration of intravenous contrast. CONTRAST:  142mL OMNIPAQUE IOHEXOL 300 MG/ML  SOLN COMPARISON:  07/22/2015 FINDINGS: Lower chest: Stable mild cardiomegaly. Moderate bilateral pleural effusions and bibasilar atelectasis show no significant change. Hepatobiliary: No masses identified. Periportal edema noted. Prior cholecystectomy noted. No evidence of biliary dilatation. Pancreas: No mass, inflammatory changes, or other significant abnormality. Spleen: Within normal limits in size and appearance. Adrenals/Urinary Tract: No adrenal masses identified. Bilateral diffuse renal parenchymal atrophy, consistent with end-stage renal disease. No evidence of hydronephrosis. Two enhancing lesions are seen in the upper pole the right kidney measuring 1.3 cm anteriorly on image 37/series 2 and 11 mm posteriorly on image 36/series 2. These are suspicious for small renal cell carcinomas. No evidence hydronephrosis. Unopacified urinary bladder is unremarkable in appearance. Stomach/Bowel: No evidence of obstruction, inflammatory process, or abnormal fluid collections. Sigmoid diverticulosis. No radiographic evidence of diverticulitis. Vascular/Lymphatic: No pathologically enlarged lymph nodes. No evidence of abdominal aortic aneurysm. Reproductive: Prior hysterectomy noted. Adnexal regions are unremarkable in appearance. Other: Diffuse body wall edema again demonstrated. Musculoskeletal:  No suspicious bone lesions identified. IMPRESSION: Colonic diverticulosis. No radiographic evidence of diverticulitis or bowel obstruction.  Stable moderate bilateral pleural effusions and diffuse body wall edema/anasarca. Diffuse bilateral renal atrophy, consistent with end-stage renal disease. Two small enhancing masses in upper pole of right kidney, suspicious for small renal cell carcinomas. Recommend urology referral and consider abdomen MRI without contrast for further evaluation. Electronically Signed   By: Earle Gell M.D.   On: 11/12/2015 11:44   Dg Chest Port 1 View  11/12/2015  CLINICAL DATA:  Nausea and vomiting since dialysis 3 days ago. Unable to finish dialysis at that time. Wheezing. Asthma. EXAM: PORTABLE CHEST 1 VIEW COMPARISON:  10/25/2015. FINDINGS: Stable left axillary and right arm stents. Mildly progressive enlargement of the cardiac silhouette with mildly increased prominence of the pulmonary vasculature and interstitial markings and increased size of bilateral pleural effusions and bibasilar atelectasis. Diffuse osteopenia. IMPRESSION: Mild worsening of changes of congestive heart failure. Electronically Signed   By: Claudie Revering M.D.   On: 11/12/2015 12:54     Medications:   . dextrose 50 mL/hr at 11/13/15 2100   . acidophilus  1 capsule Oral Daily  . amiodarone  400 mg Oral Daily  . amLODipine  10 mg Oral Daily  . aspirin EC  81 mg Oral Daily  . atorvastatin  20 mg Oral QPM  . bacitracin   Topical BID  . carvedilol  25 mg Oral BID WC  . cholecalciferol  400 Units Oral Daily  . citalopram  20 mg Oral Daily  . clopidogrel  75 mg Oral Daily  . dextrose      . dextrose      . feeding supplement (NEPRO CARB STEADY)  237 mL Oral BID BM  . furosemide  80 mg Intravenous BID  . heparin  5,000 Units Subcutaneous 3 times per day  . lipase/protease/amylase  12,000 Units Oral TID WC  . loratadine  10 mg Oral Daily  . metoCLOPramide  5 mg Oral TID AC & HS  . multivitamin  1 tablet Oral QHS  . pantoprazole  40 mg Oral Daily  . ranolazine  1,000 mg Oral BID  . sevelamer carbonate  800 mg Oral TID WC  . vitamin  B-12  1,000 mcg Oral Daily   acetaminophen **OR** acetaminophen, albuterol, alum & mag hydroxide-simeth, bisacodyl, glucose, iohexol, nitroGLYCERIN, oxyCODONE, phenol, pregabalin, promethazine, senna-docusate  Assessment/ Plan:  55 y.o. 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    [facility] MWF [place]  1. End Stage Renal Disease:  - Dialyzed yesterday, 3 L removed - requiring biPAP but with fever, suspect pneumonia - CXR indicated pulmonary congestion and pleural effusions - plan to dialyze today for more careful volume removal. Goal 1.5-2.5 L  2. Anemia of chronic kidney disease:  - hemoglobin currently   -  Will restart EPO if Hgb trends down  3.  Acute resp failure - combination of Pneumonia, Anasarca and acute pulm edema - consider testing for Flu - consider starting broad spectrum Abx  4.  Hyperkalemia  - corrected  Low K bath  5. Gastroparesis, Nausea and vomiting - Phenergan liquid - Reglan  6. Hypoglycemia - likely due to sepsis - requiring D10 administration     LOS:  [name] 2/22/20179:14 AM

## 2015-11-14 NOTE — Progress Notes (Signed)
Pharmacy Antibiotic Note Day 1  [name] is a 55 y.o. female admitted on 11/12/2015 with sepsis.  Pharmacy has been consulted for Zosyn dosing. Patient with persistent hyperglycemia; now intubated. Patient receiving hydrocortisone 50mg  IV Q8hr.   Plan: Zosyn EI 3.375g IV Q12hr.   Height: 5\' 5"  (165.1 cm) Weight: 161 lb 9.6 oz (73.301 kg) IBW/kg (Calculated) : 57  Temp (24hrs), Avg:100.3 F (37.9 C), Min:98.4 F (36.9 C), Max:102.7 F (39.3 C)   Recent Labs Lab 11/09/15 1131 11/09/15 1328 11/12/15 0556 11/12/15 0622 11/13/15 0744 11/13/15 1425 11/14/15 0053  WBC 5.2  --  8.7  --   --   --  3.0*  CREATININE  --  3.02*  --  5.19* 3.78* 4.13*  --     Estimated Creatinine Clearance: 15.2 mL/min (by C-G formula based on Cr of 4.13).    Allergies  Allergen Reactions  . Compazine [Prochlorperazine Edisylate] Anaphylaxis and Nausea And Vomiting  . Ace Inhibitors Swelling  . Ativan [Lorazepam] Other (See Comments)    Reaction:  Hallucinations and headaches  . Codeine Nausea And Vomiting  . Gabapentin Other (See Comments)    Reaction:  Unknown   . Losartan Other (See Comments)    Reaction:  Unknown   . Ondansetron Other (See Comments)    Reaction:  Unknown   . Prochlorperazine Other (See Comments)    Reaction:  Unknown   . Scopolamine Other (See Comments)    Reaction:  Unknown   . Zofran [Ondansetron Hcl] Other (See Comments)    Reaction:  hallucinations   . Oxycodone Anxiety  . Tape Rash    Antimicrobials this admission: Zosyn 2/22 >>    Microbiology results: 2/22 BCx x 2: pending  2/22 UCx: pending  2/22 MRSA PCR: negative  Pharmacy will continue to monitor and adjust per consult.    Simpson,Michael L 11/14/2015 2:19 PM

## 2015-11-14 NOTE — Consult Note (Signed)
Pharmacy Antibiotic Note  [name] is a 55 y.o. female admitted on 11/12/2015 with sepsis.  Pharmacy has been consulted for vancomycin dosing. Pt has a PMH of ESRD on HD.outpatient she receives HD on MWF. However she had emergent HD yesterday, was supposed to have it again today, but per nurse pt no longer doing for HD today.  Plan: Vancomycin 1500mg  once. Then 750mg  q dialysis. trough prior to the 3rd HD session  Height: 5\' 5"  (165.1 cm) Weight: 161 lb 9.6 oz (73.301 kg) IBW/kg (Calculated) : 57  Temp (24hrs), Avg:100.3 F (37.9 C), Min:98.4 F (36.9 C), Max:102.7 F (39.3 C)   Recent Labs Lab 11/09/15 1131 11/09/15 1328 11/12/15 0556 11/12/15 0622 11/13/15 0744 11/13/15 1425 11/14/15 0053  WBC 5.2  --  8.7  --   --   --  3.0*  CREATININE  --  3.02*  --  5.19* 3.78* 4.13*  --     Estimated Creatinine Clearance: 15.2 mL/min (by C-G formula based on Cr of 4.13).    Allergies  Allergen Reactions  . Compazine [Prochlorperazine Edisylate] Anaphylaxis and Nausea And Vomiting  . Ace Inhibitors Swelling  . Ativan [Lorazepam] Other (See Comments)    Reaction:  Hallucinations and headaches  . Codeine Nausea And Vomiting  . Gabapentin Other (See Comments)    Reaction:  Unknown   . Losartan Other (See Comments)    Reaction:  Unknown   . Ondansetron Other (See Comments)    Reaction:  Unknown   . Prochlorperazine Other (See Comments)    Reaction:  Unknown   . Scopolamine Other (See Comments)    Reaction:  Unknown   . Zofran [Ondansetron Hcl] Other (See Comments)    Reaction:  hallucinations   . Oxycodone Anxiety  . Tape Rash    Antimicrobials this admission: 2/22 Zosyn >>  2/22 vancomycin >>   Dose adjustments this admission:   Microbiology results: 2/22 BCx x 2: pending  2/22 UCx: pending  2/22 MRSA PCR: negative  Thank you for allowing pharmacy to be a part of this patient's care.  Ramond Dial 11/14/2015 4:55 PM

## 2015-11-14 NOTE — Procedures (Signed)
Endotracheal Intubation: Patient required placement of an artificial airway secondary to resp failure.   Consent: Emergent.   Hand washing performed prior to starting the procedure.   Medications administered for sedation prior to procedure: Midazolam 4 mg IV,  succinylcholine 30 mg IV Fentanyl 50 mcg IV.   Procedure: A time out procedure was called and correct patient, name, & ID confirmed. Needed supplies and equipment were assembled and checked to include ETT, 10 ml syringe, Glidescope, Mac and Miller blades, suction, oxygen and bag mask valve, end tidal CO2 monitor. Patient was positioned to align the mouth and pharynx to facilitate visualization of the glottis.  Heart rate, SpO2 and blood pressure was continuously monitored during the procedure. Pre-oxygenation was conducted prior to intubation and endotracheal tube was placed through the vocal cords into the trachea.   The artificial airway was placed under direct visualization via glidescope route using a 7.5 ETT on the first attempt.    ETT was secured at 24 cm mark.    Placement was confirmed by auscuitation of lungs with good breath sounds bilaterally and no stomach sounds.  Condensation was noted on endotracheal tube.  Pulse ox %.  CO2 detector in place with appropriate color change.   Complications: None .    Chest radiograph ordered and pending.   Note during intubation it was noted the patient had copious dried dark secretions within the oral cavity consistent with dried mucus as well as possibly old food. Immediately after intubation thick dark secretions were suctioned from the endotracheal tube.  Marda Stalker, M.D.

## 2015-11-14 NOTE — Consult Note (Addendum)
Endocrine Initial Consult Note Date of Consult: 11/14/2015  Consulting Service: [facility] Endocrinology  Service Requesting Consult: Mody, S  SUBJECTIVE: Reason for Consultation: hypoglycemia The history was obtained entirely from the EMR. Patient is intubated and sedated and cannot provide a history.  History of Present Illness: [name] is a 55 y.o. female with ESRD on hemodialysis, HTN, CAD, CHF, and Diabetes admitted with nausea and vomiting. She was ill and missed hemodialysis. She was admitted and dialyzed emergently. Endocrinology consulted for persistent hypoglycemia requiring dextrose infusion and boluses of D50w. Serum glucoses have been as low as 60s. Fingerstick BG nadir was 30. She has been febrile as well with Tmax 102.57F in past day. She was intubated prior to my visit for respiratory failure.  Patient Active Problem List   Diagnosis Date Noted  . High temperature 11/14/2015  . Nausea 11/12/2015  . Hyperkalemia 10/03/2015  . C. difficile diarrhea 07/22/2015  . Pneumonia 05/21/2015  . Hypoglycemia 04/24/2015  . Unresponsiveness 04/24/2015  . Bradycardia 04/24/2015  . Hypothermia 04/24/2015  . Acute respiratory failure ([place]) 04/24/2015  . Acute diastolic CHF (congestive heart failure) ([place]) 04/05/2015  . Diabetic gastroparesis ([place]) 04/05/2015  . Hypokalemia 04/05/2015  . Generalized weakness 04/05/2015  . Acute pulmonary edema ([place]) 04/03/2015  . Nausea and vomiting 04/03/2015  . Hypoglycemia associated with diabetes ([place]) 04/03/2015  . Anemia of chronic disease 04/03/2015  . Secondary hyperparathyroidism ([place]) 04/03/2015  . Pressure ulcer 04/02/2015  . Acute respiratory failure with hypoxia ([place]) 04/01/2015  . Adjustment disorder with anxiety 03/14/2015  . Somatic symptom disorder, mild 03/08/2015  . Coronary artery disease involving native coronary artery of native heart without angina pectoris   . Nausea & vomiting 03/06/2015  . Abdominal pain  03/06/2015  . DM (diabetes mellitus) ([place]) 03/06/2015  . HTN (hypertension) 03/06/2015  . Gastroparesis 02/24/2015  . Pleural effusion 02/19/2015  . HCAP (healthcare-associated pneumonia) 02/19/2015  . End-stage renal disease on hemodialysis ([place]) 02/19/2015     Past Medical History  Diagnosis Date  . Asthma   . Collagen vascular disease ([place])   . Diabetes mellitus without complication ([place])   . Hypertension   . Coronary artery disease     a. cath 2013: stenting to RCA (report not available); b. cath 2014: LM nl, pLAD 40%, mLAD nl, ost LCx 40%, mid LCx nl, pRCA 30% @ site of prior stent, mRCA 50%  . Diabetic neuropathy ([place])   . ESRD (end stage renal disease) on dialysis ([place])     M-W-F  . GERD (gastroesophageal reflux disease)   . Hx of pancreatitis 2015  . Myocardial infarction ([place])   . Pneumonia   . Mitral regurgitation     a. echo 10/2013: EF 62%, noWMA, mildly dilated LA, mild to mod MR/TR, GR1DD  . COPD (chronic obstructive pulmonary disease) ([place])   . chronic diastolic CHF [id number]  . dialysis 2006  . Depression   . Anxiety   . Arthritis   . Headache   . Anemia   . Renal insufficiency    Past Surgical History  Procedure Laterality Date  . Cholecystectomy    . Appendectomy    . Abdominal hysterectomy    . Dialysis fistula creation Left     upper arm  . Esophagogastroduodenoscopy N/A 03/08/2015    Procedure: ESOPHAGOGASTRODUODENOSCOPY (EGD);  Surgeon: Manya Silvas, MD;  Location: [facility] ENDOSCOPY;  Service: Endoscopy;  Laterality: N/A;  . Cardiac catheterization Left 07/26/2015    Procedure: Left Heart Cath and  Coronary Angiography;  Surgeon: Dionisio David, MD;  Location: [place] CV LAB;  Service: Cardiovascular;  Laterality: Left;  Marland Kitchen Eye surgery    . Fecal transplant N/A 08/23/2015    Procedure: FECAL TRANSPLANT;  Surgeon: Manya Silvas, MD;  Location: [facility] ENDOSCOPY;  Service: Endoscopy;  Laterality: N/A;   Family History  Problem Relation Age of  Onset  . Kidney disease Mother   . Diabetes Mother     Social History:  Social History  Substance Use Topics  . Smoking status: Current Some Day Smoker -- 0.50 packs/day    Types: Cigarettes    Last Attempt to Quit: 02/13/2015  . Smokeless tobacco: Never Used  . Alcohol Use: No     Allergies  Allergen Reactions  . Compazine [Prochlorperazine Edisylate] Anaphylaxis and Nausea And Vomiting  . Ace Inhibitors Swelling  . Ativan [Lorazepam] Other (See Comments)    Reaction:  Hallucinations and headaches  . Codeine Nausea And Vomiting  . Gabapentin Other (See Comments)    Reaction:  Unknown   . Losartan Other (See Comments)    Reaction:  Unknown   . Ondansetron Other (See Comments)    Reaction:  Unknown   . Prochlorperazine Other (See Comments)    Reaction:  Unknown   . Scopolamine Other (See Comments)    Reaction:  Unknown   . Zofran [Ondansetron Hcl] Other (See Comments)    Reaction:  hallucinations   . Oxycodone Anxiety  . Tape Rash     Medications:  No current facility-administered medications on file prior to encounter.   Current Outpatient Prescriptions on File Prior to Encounter  Medication Sig Dispense Refill  . albuterol (PROVENTIL) (2.5 MG/3ML) 0.083% nebulizer solution Take 2.5 mg by nebulization every 4 (four) hours as needed for wheezing or shortness of breath.    Marland Kitchen amLODipine (NORVASC) 10 MG tablet Take 1 tablet by mouth daily.    Marland Kitchen aspirin EC 81 MG tablet Take 81 mg by mouth daily.    Marland Kitchen atorvastatin (LIPITOR) 20 MG tablet Take 20 mg by mouth every evening.    Marland Kitchen b complex-vitamin c-folic acid (NEPHRO-VITE) 0.8 MG TABS tablet Take 1 tablet by mouth daily.    . carvedilol (COREG) 25 MG tablet Take 25 mg by mouth 2 (two) times daily with a meal.    . cetirizine (ZYRTEC) 10 MG tablet Take 10 mg by mouth daily.    . Cholecalciferol (VITAMIN D3) 400 UNITS tablet Take 400 Units by mouth daily.    . citalopram (CELEXA) 20 MG tablet Take 20 mg by mouth daily.    .  clopidogrel (PLAVIX) 75 MG tablet Take 75 mg by mouth daily.    Marland Kitchen glucose 4 GM chewable tablet Chew 3-4 tablets by mouth as needed for low blood sugar (if pts blood sugar is less than 60).    Marland Kitchen HYDROcodone-acetaminophen (NORCO/VICODIN) 5-325 MG tablet Take 1 tablet by mouth every 6 (six) hours as needed for severe pain. (Patient taking differently: Take 1 tablet by mouth every 8 (eight) hours as needed for severe pain. ) 6 tablet 0  . lipase/protease/amylase (CREON) 12000 UNITS CPEP capsule Take 12,000 Units by mouth 3 (three) times daily with meals.     . metoCLOPramide (REGLAN) 5 MG tablet Take 1 tablet (5 mg total) by mouth 4 (four) times daily -  before meals and at bedtime. 40 tablet 0  . nitroGLYCERIN (NITROSTAT) 0.4 MG SL tablet Place 0.4 mg under the tongue every 5 (five) minutes  x 3 doses as needed for chest pain. *Max 3 doses per episode*    . pregabalin (LYRICA) 25 MG capsule Take 25 mg by mouth 2 (two) times daily as needed (for leg pain).    . promethazine (PHENERGAN) 12.5 MG tablet 1-2 tablets by mouth every 8 hours as needed for nausea and vomiting (Patient taking differently: 12.5 mg every 6 (six) hours as needed for nausea or vomiting. 1-2 tablets by mouth every 8 hours as needed for nausea and vomiting) 20 tablet 0  . promethazine (PHENERGAN) 25 MG suppository Place 1 suppository (25 mg total) rectally every 6 (six) hours as needed for nausea. (Patient taking differently: Place 25 mg rectally every 8 (eight) hours as needed for nausea. ) 12 suppository 1  . promethazine (PHENERGAN) 25 MG tablet Take 1 tablet (25 mg total) by mouth every 6 (six) hours as needed for nausea or vomiting. 20 tablet 0  . sevelamer carbonate (RENVELA) 800 MG tablet Take 800 mg by mouth 3 (three) times daily with meals.       Review of Systems: Could not be completed. Patient intubated and sedated.  OBJECTIVE: Temp:  [98.4 F (36.9 C)-102.7 F (39.3 C)] 98.4 F (36.9 C) (02/22 0400) Pulse Rate:   [80-92] 80 (02/22 0400) Resp:  [19-38] 32 (02/22 0844) BP: (124-196)/(49-115) 132/59 mmHg (02/22 0400) SpO2:  [90 %-100 %] 96 % (02/22 1145) FiO2 (%):  [35 %-40 %] 40 % (02/22 1145)  Temp (24hrs), Avg:100.3 F (37.9 C), Min:98.4 F (36.9 C), Max:102.7 F (39.3 C)  Weight: 73.301 kg (161 lb 9.6 oz)  Physical Exam: Gen: intubated and sedated HEENT: ET tube in place  Neck: central line in place CAD: RRR, s1, s2 PULM: ventilated breath sounds bilaterally GI: soft, obese EXT: nonpitting edema in the lower extremities Skin: warm, dry  Neuro: not assessed   Labs:  BMP Latest Ref Rng 11/13/2015 11/13/2015 11/12/2015  Glucose 65 - 99 mg/dL 78 84 62(L)  BUN 6 - 20 mg/dL 22(H) 20 38(H)  Creatinine 0.44 - 1.00 mg/dL 4.13(H) 3.78(H) 5.19(H)  Sodium 135 - 145 mmol/L 133(L) 134(L) 134(L)  Potassium 3.5 - 5.1 mmol/L 5.0 5.4(H) 5.9(H)  Chloride 101 - 111 mmol/L 93(L) 96(L) 95(L)  CO2 22 - 32 mmol/L 32 29 30  Calcium 8.9 - 10.3 mg/dL 8.4(L) 8.5(L) 9.4   CBC Latest Ref Rng 11/14/2015 11/12/2015 11/09/2015  WBC 3.6 - 11.0 K/uL 3.0(L) 8.7 5.2  Hemoglobin 12.0 - 16.0 g/dL 11.0(L) 12.2 12.1  Hematocrit 35.0 - 47.0 % 34.8(L) 38.1 38.3  Platelets 150 - 440 K/uL 92(L) 160 190    Blood glucose values reviewed in glucose accordion view  ASSESSMENT:  1. Hypoglycemia - likely a result of infection in the setting of ESRD. Review of the EMR does not reveal any history of sulfonylurea use but this is always a possibility. Adrenal insufficiency is lower on the differential but should also be considered. 2. History of Type 2 Diabetes   RECOMMENDATIONS:   Would continue D10 infusion 75 cc/hr. Continue to follow hypoglycemia protocol. Treat empirically for infection as you are with broad spectrum abx. Start IV hydrocortisone 50 mg every 8 hours Decrease to q2hr glucometer checks. Once BG stable, can reduce to q3hr glucometer checks. Will follow along. Thank you for this consult  Atha Starks, MD [facility]  Endocrinology  Addendum:  Cannot assess adrenal function with IV steroids already being administered. Cortisol level will be suppressed in response. Am cortisol cancelled.

## 2015-11-15 ENCOUNTER — Inpatient Hospital Stay: Payer: Medicare Other

## 2015-11-15 DIAGNOSIS — R110 Nausea: Secondary | ICD-10-CM

## 2015-11-15 LAB — CBC
HCT: 31.7 % — ABNORMAL LOW (ref 35.0–47.0)
Hemoglobin: 10.1 g/dL — ABNORMAL LOW (ref 12.0–16.0)
MCH: 27.3 pg (ref 26.0–34.0)
MCHC: 31.9 g/dL — ABNORMAL LOW (ref 32.0–36.0)
MCV: 85.8 fL (ref 80.0–100.0)
Platelets: 108 10*3/uL — ABNORMAL LOW (ref 150–440)
RBC: 3.7 MIL/uL — ABNORMAL LOW (ref 3.80–5.20)
RDW: 19.7 % — ABNORMAL HIGH (ref 11.5–14.5)
WBC: 3.9 10*3/uL (ref 3.6–11.0)

## 2015-11-15 LAB — C DIFFICILE QUICK SCREEN W PCR REFLEX
C Diff antigen: POSITIVE — AB
C Diff toxin: NEGATIVE

## 2015-11-15 LAB — URINE CULTURE: Culture: NO GROWTH

## 2015-11-15 LAB — MAGNESIUM: Magnesium: 1.6 mg/dL — ABNORMAL LOW (ref 1.7–2.4)

## 2015-11-15 LAB — GLUCOSE, CAPILLARY
Glucose-Capillary: 109 mg/dL — ABNORMAL HIGH (ref 65–99)
Glucose-Capillary: 123 mg/dL — ABNORMAL HIGH (ref 65–99)
Glucose-Capillary: 130 mg/dL — ABNORMAL HIGH (ref 65–99)
Glucose-Capillary: 153 mg/dL — ABNORMAL HIGH (ref 65–99)
Glucose-Capillary: 63 mg/dL — ABNORMAL LOW (ref 65–99)
Glucose-Capillary: 96 mg/dL (ref 65–99)

## 2015-11-15 LAB — BASIC METABOLIC PANEL
Anion gap: 10 (ref 5–15)
BUN: 33 mg/dL — ABNORMAL HIGH (ref 6–20)
CO2: 27 mmol/L (ref 22–32)
Calcium: 7.6 mg/dL — ABNORMAL LOW (ref 8.9–10.3)
Chloride: 92 mmol/L — ABNORMAL LOW (ref 101–111)
Creatinine, Ser: 4.02 mg/dL — ABNORMAL HIGH (ref 0.44–1.00)
GFR calc Af Amer: 13 mL/min — ABNORMAL LOW (ref >60)
GFR calc non Af Amer: 11 mL/min — ABNORMAL LOW (ref >60)
Glucose, Bld: 146 mg/dL — ABNORMAL HIGH (ref 65–99)
Potassium: 3.4 mmol/L — ABNORMAL LOW (ref 3.5–5.1)
Sodium: 129 mmol/L — ABNORMAL LOW (ref 135–145)

## 2015-11-15 LAB — CLOSTRIDIUM DIFFICILE BY PCR: Toxigenic C. Difficile by PCR: NEGATIVE

## 2015-11-15 LAB — PHOSPHORUS: Phosphorus: 2.2 mg/dL — ABNORMAL LOW (ref 2.5–4.6)

## 2015-11-15 MED ORDER — DEXTROSE 10 % IV SOLN
INTRAVENOUS | Status: DC
  Administered 2015-11-15: 21:00:00 via INTRAVENOUS

## 2015-11-15 MED ORDER — VITAL HIGH PROTEIN PO LIQD: 1000.0000 mL | ORAL | Status: DC

## 2015-11-15 MED ORDER — FREE WATER
100.0000 mL | Freq: Three times a day (TID) | Status: DC
  Administered 2015-11-15 – 2015-11-18 (×9): 100 mL

## 2015-11-15 MED ORDER — VITAL 1.5 CAL PO LIQD
1000.0000 mL | ORAL | Status: DC
  Administered 2015-11-15 – 2015-11-16 (×2): 1000 mL

## 2015-11-15 MED ORDER — MAGNESIUM SULFATE 2 GM/50ML IV SOLN
2.0000 g | Freq: Once | INTRAVENOUS | Status: AC
  Administered 2015-11-15: 2 g via INTRAVENOUS
  Filled 2015-11-15: qty 50

## 2015-11-15 MED ORDER — PRO-STAT SUGAR FREE PO LIQD
30.0000 mL | Freq: Three times a day (TID) | ORAL | Status: DC
  Administered 2015-11-15 – 2015-11-17 (×8): 30 mL

## 2015-11-15 MED ORDER — METOCLOPRAMIDE HCL 5 MG/ML IJ SOLN
5.0000 mg | Freq: Four times a day (QID) | INTRAMUSCULAR | Status: DC
  Administered 2015-11-15 – 2015-11-17 (×8): 5 mg via INTRAVENOUS
  Filled 2015-11-15 (×6): qty 2

## 2015-11-15 MED ORDER — HYDROCORTISONE NA SUCCINATE PF 100 MG IJ SOLR
25.0000 mg | Freq: Two times a day (BID) | INTRAMUSCULAR | Status: DC
  Administered 2015-11-15 – 2015-11-18 (×6): 25 mg via INTRAVENOUS
  Filled 2015-11-15 (×6): qty 2

## 2015-11-15 MED ORDER — VANCOMYCIN 50 MG/ML ORAL SOLUTION
125.0000 mg | Freq: Four times a day (QID) | ORAL | Status: DC
  Administered 2015-11-15 – 2015-11-20 (×14): 125 mg via ORAL
  Filled 2015-11-15 (×24): qty 2.5

## 2015-11-15 MED ORDER — POTASSIUM CHLORIDE 20 MEQ PO PACK
20.0000 meq | PACK | Freq: Once | ORAL | Status: AC
  Administered 2015-11-15: 20 meq via ORAL
  Filled 2015-11-15: qty 1

## 2015-11-15 MED ORDER — BACID PO TABS
2.0000 | ORAL_TABLET | Freq: Three times a day (TID) | ORAL | Status: DC
  Administered 2015-11-15 (×3): 2 via ORAL
  Filled 2015-11-15 (×6): qty 2

## 2015-11-15 NOTE — Progress Notes (Signed)
[place] [facility] [place] NAME: Naina Apel    MR#:  [id number]  DATE OF BIRTH:  [birth year]  Patient intubated yesterday. Stool for C. difficile positive      CHIEF COMPLAINT:   Chief Complaint  Patient presents with  . Nausea  . Emesis    REVIEW OF SYSTEMS:    Review of Systems  Unable to perform ROS: critical illness  Constitutional: Negative for fever and chills.  HENT: Negative for hearing loss.   Eyes: Negative for blurred vision, double vision and photophobia.  Respiratory: Negative for cough, hemoptysis and shortness of breath.   Cardiovascular: Negative for palpitations, orthopnea and leg swelling.  Gastrointestinal: Negative for nausea, vomiting, abdominal pain and diarrhea.  Genitourinary: Negative for dysuria and urgency.  Musculoskeletal: Negative for myalgias and neck pain.  Skin: Negative for rash.  Neurological: Negative for dizziness, focal weakness, seizures, weakness and headaches.        Lethargic.  Psychiatric/Behavioral: Negative for memory loss. The patient does not have insomnia.     Nutrition:  Tolerating Diet: Tolerating PT:      DRUG ALLERGIES:   Allergies  Allergen Reactions  . Compazine [Prochlorperazine Edisylate] Anaphylaxis and Nausea And Vomiting  . Ace Inhibitors Swelling  . Ativan [Lorazepam] Other (See Comments)    Reaction:  Hallucinations and headaches  . Codeine Nausea And Vomiting  . Gabapentin Other (See Comments)    Reaction:  Unknown   . Losartan Other (See Comments)    Reaction:  Unknown   . Ondansetron Other (See Comments)    Reaction:  Unknown   . Prochlorperazine Other (See Comments)    Reaction:  Unknown   . Scopolamine Other (See Comments)    Reaction:  Unknown   . Zofran [Ondansetron Hcl] Other (See Comments)    Reaction:  hallucinations   . Oxycodone Anxiety  . Tape Rash    VITALS:  Blood pressure 111/46, pulse 91, temperature 97.6 F (36.4 C), temperature  source Axillary, resp. rate 20, height 5\' 5"  (1.651 m), weight 73.301 kg (161 lb 9.6 oz), SpO2 96 %.  PHYSICAL EXAMINATION:   Physical Exam  GENERAL:  55 y.o.-year-old patient lying in the bed  intubated, currently intubated ,sedated.Marland Kitchen EYES: Pupils equal, round, reactive to light and accommodation. No scleral icterus. Extraocular muscles intact.  HEENT: Head atraumatic, normocephalic. Oropharynx and nasopharynx clear.  NECK:  Supple, no jugular venous distention. No thyroid enlargement, no tenderness.  LUNGS: , Decreased breath sounds bilaterally,  has diffuse bilateral crackles/ No use of accessory muscles of respiration.  CARDIOVASCULAR: S1, S2 normal. No murmurs, rubs, or gallops.  ABDOMEN: Soft, nontender, nondistended. Bowel sounds present. No organomegaly or mass.  EXTREMITIES: No pedal edema, cyanosis, or clubbing.  NEUROLOGIC:  Unable to do full neurological exam secondary to lethargy .PSYCHIATRIC:lethargic.Marland Kitchen  SKIN: No obvious rash, lesion, or ulcer.    LABORATORY PANEL:   CBC  Recent Labs Lab 11/15/15 0549  WBC 3.9  HGB 10.1*  HCT 31.7*  PLT 108*   ------------------------------------------------------------------------------------------------------------------  Chemistries   Recent Labs Lab 11/13/15 1425 11/15/15 0549  NA 133* 129*  K 5.0 3.4*  CL 93* 92*  CO2 32 27  GLUCOSE 78 146*  BUN 22* 33*  CREATININE 4.13* 4.02*  CALCIUM 8.4* 7.6*  MG  --  1.6*  AST 20  --   ALT 11*  --   ALKPHOS 144*  --   BILITOT 1.1  --    ------------------------------------------------------------------------------------------------------------------  Cardiac Enzymes  Recent Labs Lab 11/12/15 0622  TROPONINI <0.03   ------------------------------------------------------------------------------------------------------------------  RADIOLOGY:  Dg Chest 1 View  11/14/2015  CLINICAL DATA:  Pulmonary edema.  History of CHF and asthma. EXAM: CHEST 1 VIEW COMPARISON:   Portable chest x-ray of November 13, 2015 FINDINGS: The lungs remain hypoinflated. Moderate-sized bilateral pleural effusions remain. The retrocardiac region on the left remains dense. The cardiac silhouette remains enlarged and the pulmonary vascularity remains engorged. The mediastinum is normal in width. The observed bony thorax is unremarkable. A vascular graft is present in the right antecubital and left axillary regions. IMPRESSION: Congestive heart failure with pulmonary interstitial edema and bilateral pleural effusions. There has not been significant interval change since yesterday's study. Density at both lung bases may reflect atelectasis or pneumonia. Electronically Signed   By: David  Martinique M.D.   On: 11/14/2015 08:55   Dg Chest 1 View  11/14/2015  CLINICAL DATA:  End-stage renal disease and anemia EXAM: CHEST 1 VIEW COMPARISON:  November 12, 2015 FINDINGS: There is cardiomegaly with pulmonary venous hypertension. There are bilateral pleural effusions. There is mild bibasilar interstitial edema. There is atelectatic change in the lung bases as well. There is a left axillary region stent. IMPRESSION: Findings consistent with congestive heart failure. Bibasilar atelectatic change; superimposed early pneumonia in the bases cannot be excluded radiographically. Electronically Signed   By: Lowella Grip III M.D.   On: 11/14/2015 08:52   Dg Abd 1 View  11/14/2015  CLINICAL DATA:  OG tube placement. EXAM: ABDOMEN - 1 VIEW COMPARISON:  None. FINDINGS: OG tube is in place in good position with the side-port in the stomach. IMPRESSION: As above. Electronically Signed   By: Inge Rise M.D.   On: 11/14/2015 13:23   Dg Chest Port 1 View  11/15/2015  CLINICAL DATA:  Acute respiratory failure EXAM: PORTABLE CHEST 1 VIEW COMPARISON:  11/14/2015 FINDINGS: Endotracheal tube in good position. Central venous catheter tip near the cavoatrial junction unchanged. No pneumothorax. NG tube in the stomach  Progression of diffuse bilateral airspace disease. Bilateral pleural effusions and bibasilar atelectasis. IMPRESSION: Endotracheal tube remains in good position Worsening bilateral airspace disease most likely heart failure with edema. Electronically Signed   By: Franchot Gallo M.D.   On: 11/15/2015 06:40   Dg Chest Port 1 View  11/14/2015  CLINICAL DATA:  Status post central line placement EXAM: PORTABLE CHEST 1 VIEW COMPARISON:  11/14/2015 FINDINGS: Cardiac shadow is mildly enlarged. Bilateral pleural effusions and likely bibasilar infiltrate is again identified. Changes consistent with prior stenting in both upper extremities is seen. Additionally the left-sided stenting extends centrally into the subclavian vessel. A new left subclavian central venous line is noted with catheter tip in the superior right atrium. No pneumothorax is noted. Endotracheal tube and nasogastric catheter are noted in satisfactory position. The endotracheal tube is approximately 5 cm above the carina. No bony abnormality is seen. IMPRESSION: Status post left central venous line without pneumothorax. Interval placement of endotracheal tube and nasogastric catheter. Bibasilar changes stable from the prior exam. Electronically Signed   By: Inez Catalina M.D.   On: 11/14/2015 13:27     ASSESSMENT AND PLAN:   Active Problems:   Nausea   High temperature   Pulmonary edema   Encounter for central line placement   Encounter for orogastric (OG) tube placement   1. Acute respiratory failure secondary to pneumonia, pulmonary edema: Continue full vent support, dialysis as per nephrology.  #2. Septic shock: Secondary to  C. difficile, pneumonia: Continue pressors, antibiotics #2 hyperkalemia ; improved with hemodialysis. #3 gastroparesis secondary to diabetes mellitus type 2: Continue Phenergan, Reglan. #4. Diabetes mellitus type 2 with hypoglycemia:  hypoglycemia improved. Hypoglycemia due to sepsis. D10 water as per the  recommendations from endocrinology, continue IV steroids.  persistently hypoglycemic secondary to sepsis: Continue D10 water at 75 mg normal. Appreciate endocrinology follow-up #5 acute on chronic diastolic heart failure in setting of fall ESRD: Continue dialysis. Coronary artery disease patient is on Plavix, Coreg, amiodarone continue them.  #7 chronic pancreatitis: Continue Creon   8 hypotension likely due to sepsis. Patient blood pressure is borderline.. Hold Coreg,lasix.  in due to fever  Flu test is negative.  High risk for cardiac arrest  All the records are reviewed and case discussed with Care Management/Social Workerr. Management plans discussed with the patient, family and they are in agreement.  CODE STATUS:full  TOTAL TIME TAKING CARE OF THIS PATIENT:  36 minutes of critical care  condition critical ;high risk for cardiac arrest  Discussed with patient's son at bedside. At 15 minutes spent on discussion in addition to 36 minutes of     Katrine Radich M.D on 11/15/2015 at 12:39 PM  Between 7am to 6pm - Pager - [phone]  After 6pm go to [email or web address] - password [email or web address] [facility]  Office  [phone]  CC: Primary care physician; Ellamae Sia, MD

## 2015-11-15 NOTE — Progress Notes (Signed)
PULMONARY / CRITICAL CARE MEDICINE   Name: [name] MRN: [id number] DOB: October 15, 1960    ASSESSMENT / PLAN:  55 YO female with ESRD on dialysis presenting with acute hypoxic respiratory failure secondary to bilateral pneumonia-aspiration; septic shock and severe hypoglycemia; ESRD on HD.    PULMONARY A: Acute hypoxic respiratory failure secondary to pneumonia and pulmonary edema Bacteria versus aspiration pneumonia H/O COPD Continue ventilator dependence, ABG showed a pH of 7.48/40/65/29.8. P:   -Full vent support, continue respiratory failure, doubt that she is ready for weaning it today. -VAP prevention -Daily CXR -Empiric antibiotics as above -Bronchodilators -wean down steroids.   CARDIOVASCULAR A:  Septic shock Systolic and diastolic heart failure H/o CAD H/o Hypertension P:  -Levophed gtt and titrate to MAP goal of >65 -Hold antihypertensive 2/2 shock -Hemodynamics per ICU protocol -Continue plavix  RENAL A:   ESRD on dialysis Hyperkalemia P:   -Nephrology following -Dialysis per nephrology -Monitor and correct electrolyte imbalances  GASTROINTESTINAL A:   Nausea/vomiting P:   -OGT to intermittent suction -Antiemetics prn -PPI for GI prophylaxis -TFs as tolerated  HEMATOLOGIC A:   Anemia of chronic disease P:  -Monitor H/H and transfuse per ICU protocol  INFECTIOUS A:   Sepsis-likely source respiratory Suspect aspiration pneumonia P:   -Empiric antibiotics -f/u Cultures  CULTURES: Blood 02/22> Urine 02/22> Sputum 02/22>> MRSA Screen negative.   ANTIBIOTICS: Zosyn 02/22>> Vancomycin 02/22>>  ENDOCRINE A:   Refractory hypoglycemia Type 2 DM Hyponatremia, likely to free water replacement.  P:   -Blood glucose monitoring q4h  -No sliding scale coverage for now -D10W infusion at 32ml/hr-- will decrease rate.   NEUROLOGIC A:   Acute metabolic encephalopathy P:   RASS goal: -1 -Fentanyl and versed for vent  sedation   SIGNIFICANT EVENTS: 02/20>Admitted 02/21>Transferred to ICU 02/22>Intubated  LINES/TUBES: Left subclavian TLC 02/22>> ETT 02/22>>  STUDIES:  Last Echo 09/2015>EF 40%, Left ventricular dysfunction/hypertrophy, systolic and diastolic function(grade 1), moderate MR, severe TR and moderate pleural effusions --------------------------------------------------------  HISTORY OF PRESENT ILLNESS:   Patient remains on the vent, history and review systems cannot be obtained as the patient is on the ventilator and critically ill.  REVIEW OF SYSTEMS:   Unable to obtain due to patient's metal status  SUBJECTIVE:    VITAL SIGNS: BP 111/46 mmHg  Pulse 91  Temp(Src) 97.6 F (36.4 C) (Axillary)  Resp 20  Ht 5\' 5"  (1.651 m)  Wt 161 lb 9.6 oz (73.301 kg)  BMI 26.89 kg/m2  SpO2 96%  HEMODYNAMICS:    VENTILATOR SETTINGS: Vent Mode:  [-] PRVC FiO2 (%):  [40 %] 40 % Set Rate:  [18 bmp] 18 bmp Vt Set:  [450 mL] 450 mL PEEP:  [5 cmH20] 5 cmH20 Plateau Pressure:  [20 cmH20-27 cmH20] 27 cmH20  INTAKE / OUTPUT: I/O last 3 completed shifts: In: 3285.7 [I.V.:2685.7; IV Piggyback:600] Out: 160 [Urine:160]  PHYSICAL EXAMINATION: General: Acutely ill looking Neuro:  Unresponsive to voice, withdraws to noxious stimulus HEENT: PERRLA, ETT, oral mucosa dry, intubated.  Cardiovascular:  RRR, S1/S1, +2 PULSES; no edema Lungs: Bilateral airflow, +rhonchi and rales bilaterally Abdomen: Soft, NT/ND, normal bowel sounds Musculoskeletal:  No joint deformities Skin: Warm, dry  LABS:  BMET  Recent Labs Lab 11/13/15 0744 11/13/15 1425 11/15/15 0549  NA 134* 133* 129*  K 5.4* 5.0 3.4*  CL 96* 93* 92*  CO2 29 32 27  BUN 20 22* 33*  CREATININE 3.78* 4.13* 4.02*  GLUCOSE 84 78 146*    Electrolytes  Recent Labs Lab 11/13/15 0744 11/13/15 1425 11/15/15 0549  CALCIUM 8.5* 8.4* 7.6*  MG  --   --  1.6*  PHOS  --   --  2.2*    CBC  Recent Labs Lab 11/12/15 0556  11/14/15 0053 11/15/15 0549  WBC 8.7 3.0* 3.9  HGB 12.2 11.0* 10.1*  HCT 38.1 34.8* 31.7*  PLT 160 92* 108*    Coag's No results for input(s): APTT, INR in the last 168 hours.  Sepsis Markers No results for input(s): LATICACIDVEN, PROCALCITON, O2SATVEN in the last 168 hours.  ABG  Recent Labs Lab 11/13/15 2118 11/14/15 1415  PHART 7.44 7.48*  PCO2ART 49* 40  PO2ART 73* 65*    Liver Enzymes  Recent Labs Lab 11/12/15 0622 11/13/15 0744 11/13/15 1425  AST 18 25 20   ALT 12* 13* 11*  ALKPHOS 125 137* 144*  BILITOT 1.0 0.9 1.1  ALBUMIN 3.2* 3.0* 3.1*    Cardiac Enzymes  Recent Labs Lab 11/12/15 0622  TROPONINI <0.03    Glucose  Recent Labs Lab 11/14/15 1617 11/14/15 1805 11/14/15 2002 11/15/15 0005 11/15/15 0415 11/15/15 0826  GLUCAP 91 89 91 109* 130* 153*    Imaging Dg Abd 1 View  11/14/2015  CLINICAL DATA:  OG tube placement. EXAM: ABDOMEN - 1 VIEW COMPARISON:  None. FINDINGS: OG tube is in place in good position with the side-port in the stomach. IMPRESSION: As above. Electronically Signed   By: Inge Rise M.D.   On: 11/14/2015 13:23   Dg Chest Port 1 View  11/15/2015  CLINICAL DATA:  Acute respiratory failure EXAM: PORTABLE CHEST 1 VIEW COMPARISON:  11/14/2015 FINDINGS: Endotracheal tube in good position. Central venous catheter tip near the cavoatrial junction unchanged. No pneumothorax. NG tube in the stomach Progression of diffuse bilateral airspace disease. Bilateral pleural effusions and bibasilar atelectasis. IMPRESSION: Endotracheal tube remains in good position Worsening bilateral airspace disease most likely heart failure with edema. Electronically Signed   By: Franchot Gallo M.D.   On: 11/15/2015 06:40   Dg Chest Port 1 View  11/14/2015  CLINICAL DATA:  Status post central line placement EXAM: PORTABLE CHEST 1 VIEW COMPARISON:  11/14/2015 FINDINGS: Cardiac shadow is mildly enlarged. Bilateral pleural effusions and likely bibasilar  infiltrate is again identified. Changes consistent with prior stenting in both upper extremities is seen. Additionally the left-sided stenting extends centrally into the subclavian vessel. A new left subclavian central venous line is noted with catheter tip in the superior right atrium. No pneumothorax is noted. Endotracheal tube and nasogastric catheter are noted in satisfactory position. The endotracheal tube is approximately 5 cm above the carina. No bony abnormality is seen. IMPRESSION: Status post left central venous line without pneumothorax. Interval placement of endotracheal tube and nasogastric catheter. Bibasilar changes stable from the prior exam. Electronically Signed   By: Inez Catalina M.D.   On: 11/14/2015 13:27       Critical Care Attestation.  I have personally obtained a history, examined the patient, evaluated laboratory and imaging results, formulated the assessment and plan and placed orders. The Patient requires high complexity decision making for assessment and support, frequent evaluation and titration of therapies, application of advanced monitoring technologies and extensive interpretation of multiple databases. The patient has critical illness that could lead imminently to failure of 1 or more organ systems and requires the highest level of physician preparedness to intervene.  Critical Care Time devoted to patient care services described in this note is 35 minutes and is  exclusive of time spent in procedures.  Marda Stalker, M.D.

## 2015-11-15 NOTE — Progress Notes (Signed)
Chaplain rounded the unit and provided  compassionate care and a presence. Chaplain also provided silent [phone] [phone]

## 2015-11-15 NOTE — Progress Notes (Addendum)
Pharmacy Antibiotic Note Day 1  [name] is a 55 y.o. female admitted on 11/12/2015 with sepsis.  Pharmacy has been consulted for Zosyn and vancomycin dosing.Patient receiving hydrocortisone 25mg  IV Q12hr. Patient is negative for clostridium difficile, but has significant history of Cdiff requiring fecal transplant. Patient ordered vancomycin 125mg  VT Q6hr while on broad spectrum antibiotics.   Plan: Zosyn EI 3.375g IV Q12hr.  Vancomycin 750mg  IV to be infused during the last hour of dialysis. Patient not getting regularly scheduled dialysis, received dialysis 2/20 and next scheduled 2/24; will schedule trough with 3rd dialysis session after starting vancomycin - goal trough 15-25.    Height: 5\' 5"  (165.1 cm) Weight: 161 lb 9.6 oz (73.301 kg) IBW/kg (Calculated) : 57  Temp (24hrs), Avg:99.4 F (37.4 C), Min:97.6 F (36.4 C), Max:102.5 F (39.2 C)   Recent Labs Lab 11/09/15 1131 11/09/15 1328 11/12/15 0556 11/12/15 0622 11/13/15 0744 11/13/15 1425 11/14/15 0053 11/15/15 0549  WBC 5.2  --  8.7  --   --   --  3.0* 3.9  CREATININE  --  3.02*  --  5.19* 3.78* 4.13*  --  4.02*    Estimated Creatinine Clearance: 15.7 mL/min (by C-G formula based on Cr of 4.02).    Allergies  Allergen Reactions  . Compazine [Prochlorperazine Edisylate] Anaphylaxis and Nausea And Vomiting  . Ace Inhibitors Swelling  . Ativan [Lorazepam] Other (See Comments)    Reaction:  Hallucinations and headaches  . Codeine Nausea And Vomiting  . Gabapentin Other (See Comments)    Reaction:  Unknown   . Losartan Other (See Comments)    Reaction:  Unknown   . Ondansetron Other (See Comments)    Reaction:  Unknown   . Prochlorperazine Other (See Comments)    Reaction:  Unknown   . Scopolamine Other (See Comments)    Reaction:  Unknown   . Zofran [Ondansetron Hcl] Other (See Comments)    Reaction:  hallucinations   . Oxycodone Anxiety  . Tape Rash    Antimicrobials this admission: Zosyn 2/22 >>   Vancomycin IV 2/22>> Vancomycin PO 2/23>>   Microbiology results: 2/22 BCx x 2: no growth x 1 day  2/22 Sputum Cx: pending   2/22 UCx: no growth  2/22 MRSA PCR: negative 2/23 Cdiff: negative  Pharmacy will continue to monitor and adjust per consult.    Simpson,Michael L 11/15/2015 3:42 PM

## 2015-11-15 NOTE — Progress Notes (Signed)
[facility] NOTE  Pharmacy Consult for electrolyte replacement     Allergies  Allergen Reactions  . Compazine [Prochlorperazine Edisylate] Anaphylaxis and Nausea And Vomiting  . Ace Inhibitors Swelling  . Ativan [Lorazepam] Other (See Comments)    Reaction:  Hallucinations and headaches  . Codeine Nausea And Vomiting  . Gabapentin Other (See Comments)    Reaction:  Unknown   . Losartan Other (See Comments)    Reaction:  Unknown   . Ondansetron Other (See Comments)    Reaction:  Unknown   . Prochlorperazine Other (See Comments)    Reaction:  Unknown   . Scopolamine Other (See Comments)    Reaction:  Unknown   . Zofran [Ondansetron Hcl] Other (See Comments)    Reaction:  hallucinations   . Oxycodone Anxiety  . Tape Rash    Patient Measurements: Height: 5\' 5"  (165.1 cm) Weight: 161 lb 9.6 oz (73.301 kg) IBW/kg (Calculated) : 57   Vital Signs: Temp: 97.6 F (36.4 C) (02/23 0700) Temp Source: Axillary (02/23 0700) BP: 111/46 mmHg (02/23 0900) Pulse Rate: 91 (02/23 0900) Intake/Output from previous day: 02/22 0701 - 02/23 0700 In: 2835.7 [I.V.:2235.7; IV Piggyback:600] Out: 160 [Urine:160] Intake/Output from this shift: Total I/O In: 180.2 [I.V.:180.2] Out: -  Vent settings for last 24 hours: Vent Mode:  [-] PRVC FiO2 (%):  [40 %] 40 % Set Rate:  [18 bmp] 18 bmp Vt Set:  [450 mL] 450 mL PEEP:  [5 cmH20] 5 cmH20 Plateau Pressure:  [20 T2760036 cmH20] 24 cmH20  Labs:  Recent Labs  11/13/15 0744 11/13/15 1425 11/14/15 0053 11/15/15 0549  WBC  --   --  3.0* 3.9  HGB  --   --  11.0* 10.1*  HCT  --   --  34.8* 31.7*  PLT  --   --  92* 108*  CREATININE 3.78* 4.13*  --  4.02*  MG  --   --   --  1.6*  PHOS  --   --   --  2.2*  ALBUMIN 3.0* 3.1*  --   --   PROT 7.0 7.4  --   --   AST 25 20  --   --   ALT 13* 11*  --   --   ALKPHOS 137* 144*  --   --   BILITOT 0.9 1.1  --   --    Estimated Creatinine Clearance: 15.7 mL/min (by C-G  formula based on Cr of 4.02).   Recent Labs  11/15/15 0415 11/15/15 0826 11/15/15 1210  GLUCAP 130* 153* 23*    Microbiology: Recent Results (from the past 720 hour(s))  MRSA PCR Screening     Status: None   Collection Time: 11/13/15  9:46 PM  Result Value Ref Range Status   MRSA by PCR NEGATIVE NEGATIVE Final    Comment:        The GeneXpert MRSA Assay (FDA approved for NASAL specimens only), is one component of a comprehensive MRSA colonization surveillance program. It is not intended to diagnose MRSA infection nor to guide or monitor treatment for MRSA infections.   Culture, blood (Routine X 2) w Reflex to ID Panel     Status: None (Preliminary result)   Collection Time: 11/14/15 12:53 AM  Result Value Ref Range Status   Specimen Description BLOOD RIGHT HAND  Final   Special Requests BOTTLES DRAWN AEROBIC AND ANAEROBIC 5ML  Final   Culture NO GROWTH 1 DAY  Final  Report Status PENDING  Incomplete  Culture, blood (Routine X 2) w Reflex to ID Panel     Status: None (Preliminary result)   Collection Time: 11/14/15  7:14 AM  Result Value Ref Range Status   Specimen Description BLOOD RIGHT HAND  Final   Special Requests   Final    BOTTLES DRAWN AEROBIC AND ANAEROBIC AER 4ML ANA 2ML   Culture NO GROWTH < 24 HOURS  Final   Report Status PENDING  Incomplete  Urine culture     Status: None   Collection Time: 11/14/15 10:16 AM  Result Value Ref Range Status   Specimen Description URINE, RANDOM  Final   Special Requests NONE  Final   Culture NO GROWTH 1 DAY  Final   Report Status 11/15/2015 FINAL  Final  Culture, expectorated sputum-assessment     Status: None   Collection Time: 11/14/15  4:20 PM  Result Value Ref Range Status   Specimen Description SPU  Final   Special Requests NONE  Final   Sputum evaluation THIS SPECIMEN IS ACCEPTABLE FOR SPUTUM CULTURE  Final   Report Status 11/14/2015 FINAL  Final  Culture, respiratory (NON-Expectorated)     Status: None  (Preliminary result)   Collection Time: 11/14/15  4:20 PM  Result Value Ref Range Status   Specimen Description SPU  Final   Special Requests NONE Reflexed from [id number]  Final   Gram Stain   Final    MANY WBC SEEN NO SQUAMOUS EPITHELIAL CELLS PRESENT FEW GRAM POSITIVE COCCI    Culture HOLDING FOR POSSIBLE PATHOGEN  Final   Report Status PENDING  Incomplete  C difficile quick scan w PCR reflex     Status: Abnormal   Collection Time: 11/15/15  7:36 AM  Result Value Ref Range Status   C Diff antigen POSITIVE (A) NEGATIVE Final   C Diff toxin NEGATIVE NEGATIVE Final   C Diff interpretation   Final    Negative for toxigenic C. difficile. Toxin gene and active toxin production not detected. May be a nontoxigenic strain of C. difficile bacteria present, lacking the ability to produce toxin.  Clostridium Difficile by PCR     Status: None   Collection Time: 11/15/15  7:36 AM  Result Value Ref Range Status   Toxigenic C Difficile by pcr NEGATIVE NEGATIVE Final    Medications:  Scheduled:  . amiodarone  400 mg Per NG tube Daily  . antiseptic oral rinse  7 mL Mouth Rinse 10 times per day  . aspirin  81 mg Per NG tube Daily  . atorvastatin  20 mg Per NG tube QPM  . bacitracin   Topical BID  . chlorhexidine gluconate  15 mL Mouth Rinse BID  . cholecalciferol  400 Units Per Tube Daily  . citalopram  20 mg Per NG tube Daily  . clopidogrel  75 mg Per NG tube Daily  . feeding supplement (PRO-STAT SUGAR FREE 64)  30 mL Per Tube TID  . free water  100 mL Per Tube 3 times per day  . heparin  5,000 Units Subcutaneous 3 times per day  . hydrocortisone sod succinate (SOLU-CORTEF) inj  25 mg Intravenous Q12H  . ipratropium-albuterol  3 mL Nebulization Q6H  . lactobacillus acidophilus  2 tablet Oral TID  . lipase/protease/amylase  12,000 Units Oral TID WC  . metoCLOPramide (REGLAN) injection  5 mg Intravenous 4 times per day  . multivitamin  1 tablet Oral QHS  . pantoprazole (PROTONIX) IV  40 mg  Intravenous Q24H  . piperacillin-tazobactam (ZOSYN)  IV  3.375 g Intravenous Q12H  . vancomycin  125 mg Oral 4 times per day  . vitamin B-12  1,000 mcg Per NG tube Daily   Infusions:  . feeding supplement (VITAL 1.5 CAL) 1,000 mL (11/15/15 1547)  . fentaNYL infusion INTRAVENOUS 25 mcg/hr (11/15/15 0925)  . norepinephrine (LEVOPHED) Adult infusion 5 mcg/min (11/15/15 0850)    Assessment: Pharmacy consulted for electrolyte management for 55 yo female ICU patient requiring mechanical ventilation, tube feeds, and dialysis. Patient is not receiving scheduled dialysis - next dialysis is planned for 2/24.     Plan:  Will order potassium 70mEq VT x 1 and magnesium 2g IV x 1. Will obtain follow up electrolytes with am labs.   Pharmacy will continue to monitor and adjust per consult.    Simpson,Michael L 11/15/2015,3:53 PM

## 2015-11-15 NOTE — Progress Notes (Signed)
Date of Consult: 11/14/2015  Consulting Service: [facility] Endocrinology  Service Requesting Consult: Mody, S  SUBJECTIVE: Reason for Consultation: hypoglycemia The history was obtained entirely from the EMR. Patient is intubated and sedated and cannot provide a history.  History of Present Illness: [name] is a 55 y.o. female with ESRD on hemodialysis, HTN, CAD, CHF, and Diabetes admitted with nausea and vomiting. She was ill and missed hemodialysis. She was admitted and dialyzed emergently. Endocrinology consulted for persistent hypoglycemia requiring dextrose infusion and boluses of D50w.   24 hour events:  Patient was intubated yesterday for ARF secondary to pneumonia and pulm edema. She is being treated with broad spectrum antibiotics and pressors. IV hydrocortisone 50 mg q8hr was started and D10w infusion continued. Blood glucose has trended up and has been stable in the 100's. She has had no further documented hypoglycemia.   Current facility-administered medications:  .  acetaminophen (TYLENOL) tablet 650 mg, 650 mg, Oral, Q6H PRN, 650 mg at 11/14/15 1823 **OR** acetaminophen (TYLENOL) suppository 650 mg, 650 mg, Rectal, Q6H PRN, Bettey Costa, MD, 650 mg at 11/13/15 2042 .  albuterol (PROVENTIL) (2.5 MG/3ML) 0.083% nebulizer solution 2.5 mg, 2.5 mg, Nebulization, Q4H PRN, Bettey Costa, MD, 2.5 mg at 11/13/15 1028 .  alum & mag hydroxide-simeth (MAALOX/MYLANTA) 200-200-20 MG/5ML suspension 30 mL, 30 mL, Per Tube, Q6H PRN, Mikael Spray, NP .  amiodarone (PACERONE) tablet 400 mg, 400 mg, Per NG tube, Daily, Mikael Spray, NP, 400 mg at 11/15/15 1055 .  antiseptic oral rinse solution (CORINZ), 7 mL, Mouth Rinse, 10 times per day, Laverle Hobby, MD, 7 mL at 11/15/15 1302 .  aspirin chewable tablet 81 mg, 81 mg, Per NG tube, Daily, Mikael Spray, NP, 81 mg at 11/15/15 1056 .  atorvastatin (LIPITOR) tablet 20 mg, 20 mg, Per NG tube, QPM, Magadalene S Tukov, NP, 20  mg at 11/14/15 1751 .  bacitracin ointment, , Topical, BID, Bettey Costa, MD, 1 application at [id number] 1056 .  bisacodyl (DULCOLAX) EC tablet 5 mg, 5 mg, Oral, Daily PRN, Bettey Costa, MD .  chlorhexidine gluconate (PERIDEX) 0.12 % solution 15 mL, 15 mL, Mouth Rinse, BID, Laverle Hobby, MD, 15 mL at 11/15/15 0849 .  cholecalciferol (VITAMIN D) tablet 400 Units, 400 Units, Per Tube, Daily, Mikael Spray, NP, 400 Units at 11/15/15 1056 .  citalopram (CELEXA) tablet 20 mg, 20 mg, Per NG tube, Daily, Mikael Spray, NP, 20 mg at 11/15/15 1056 .  clopidogrel (PLAVIX) tablet 75 mg, 75 mg, Per NG tube, Daily, Mikael Spray, NP, 75 mg at 11/15/15 1100 .  dextrose 10 % infusion, , Intravenous, Continuous, Laverle Hobby, MD, Last Rate: 35 mL/hr at 11/15/15 1025 .  dextrose 50 % solution 25 g, 25 g, Intravenous, Q30 min PRN, Laverle Hobby, MD, 25 g at 11/14/15 1001 .  feeding supplement (NEPRO CARB STEADY) liquid 237 mL, 237 mL, Per Tube, BID BM, Magadalene S Tukov, NP, 237 mL at 11/15/15 1105 .  fentaNYL (SUBLIMAZE) bolus via infusion 50 mcg, 50 mcg, Intravenous, Q1H PRN, Mikael Spray, NP .  fentaNYL 253mcg in NS 253mL (52mcg/ml) infusion-PREMIX, 25-400 mcg/hr, Intravenous, Continuous, Mikael Spray, NP, Last Rate: 2.5 mL/hr at 11/15/15 0925, 25 mcg/hr at 11/15/15 0925 .  heparin injection 5,000 Units, 5,000 Units, Subcutaneous, 3 times per day, Bettey Costa, MD, 5,000 Units at 11/15/15 0553 .  hydrocortisone sodium succinate (SOLU-CORTEF) 100 MG injection 25 mg, 25 mg, Intravenous, Q12H, Laverle Hobby, MD .  iohexol (OMNIPAQUE) 240 MG/ML injection 25 mL, 25 mL, Oral, Once PRN, Loney Hering, MD .  ipratropium-albuterol (DUONEB) 0.5-2.5 (3) MG/3ML nebulizer solution 3 mL, 3 mL, Nebulization, Q6H, Mikael Spray, NP, 3 mL at 11/15/15 0825 .  lactobacillus acidophilus (BACID) tablet 2 tablet, 2 tablet, Oral, TID, Laverle Hobby, MD, 2 tablet at  11/15/15 1113 .  lipase/protease/amylase (CREON) capsule 12,000 Units, 12,000 Units, Oral, TID WC, Mikael Spray, NP, 12,000 Units at 11/15/15 1301 .  metoCLOPramide (REGLAN) injection 5 mg, 5 mg, Intravenous, Q6H PRN, Mikael Spray, NP .  metoCLOPramide (REGLAN) injection 5 mg, 5 mg, Intravenous, 4 times per day, Laverle Hobby, MD .  midazolam (VERSED) injection 2 mg, 2 mg, Intravenous, Q15 min PRN, Mikael Spray, NP .  midazolam (VERSED) injection 2 mg, 2 mg, Intravenous, Q2H PRN, Mikael Spray, NP .  multivitamin (RENA-VIT) tablet 1 tablet, 1 tablet, Oral, QHS, Mikael Spray, NP, 1 tablet at 11/14/15 2240 .  nitroGLYCERIN (NITROSTAT) SL tablet 0.4 mg, 0.4 mg, Sublingual, Q5 Min x 3 PRN, Bettey Costa, MD .  norepinephrine (LEVOPHED) 16 mg in dextrose 5 % 250 mL (0.064 mg/mL) infusion, 0-50 mcg/min, Intravenous, Titrated, Mikael Spray, NP, Last Rate: 4.7 mL/hr at 11/15/15 0850, 5 mcg/min at 11/15/15 0850 .  pantoprazole (PROTONIX) injection 40 mg, 40 mg, Intravenous, Q24H, Magadalene S Tukov, NP, 40 mg at 11/14/15 1751 .  piperacillin-tazobactam (ZOSYN) IVPB 3.375 g, 3.375 g, Intravenous, Q12H, Charlett Nose, RPH, 3.375 g at 11/15/15 1055 .  promethazine (PHENERGAN) 6.25 MG/5ML syrup 25 mg, 25 mg, Per Tube, Q4H PRN, Mikael Spray, NP .  senna-docusate (Senokot-S) tablet 1 tablet, 1 tablet, Oral, QHS PRN, Mikael Spray, NP .  sevelamer carbonate (RENVELA) tablet 800 mg, 800 mg, Per NG tube, TID WC, Mikael Spray, NP, 800 mg at 11/15/15 1301 .  vancomycin (VANCOCIN) IVPB 750 mg/150 ml premix, 750 mg, Intravenous, Q dialysis, Ramond Dial, RPH .  vitamin B-12 (CYANOCOBALAMIN) tablet 1,000 mcg, 1,000 mcg, Per NG tube, Daily, Mikael Spray, NP, 1,000 mcg at 11/15/15 1056  Filed Vitals:   11/15/15 0800 11/15/15 0900  BP: 124/52 111/46  Pulse: 93 91  Temp:    Resp: 20 20  on pressors  Physical Exam: Gen: intubated and sedated HEENT:  ET tube in place  Neck: central line in place CAD: RRR, s1, s2 PULM: mechanically ventilated GI: soft, obese EXT: nonpitting edema in the lower extremities Skin: warm, dry  Neuro: not assessed  BMP Latest Ref Rng 11/15/2015 11/13/2015 11/13/2015  Glucose 65 - 99 mg/dL 146(H) 78 84  BUN 6 - 20 mg/dL 33(H) 22(H) 20  Creatinine 0.44 - 1.00 mg/dL 4.02(H) 4.13(H) 3.78(H)  Sodium 135 - 145 mmol/L 129(L) 133(L) 134(L)  Potassium 3.5 - 5.1 mmol/L 3.4(L) 5.0 5.4(H)  Chloride 101 - 111 mmol/L 92(L) 93(L) 96(L)  CO2 22 - 32 mmol/L 27 32 29  Calcium 8.9 - 10.3 mg/dL 7.6(L) 8.4(L) 8.5(L)   CBC Latest Ref Rng 11/15/2015 11/14/2015 11/12/2015  WBC 3.6 - 11.0 K/uL 3.9 3.0(L) 8.7  Hemoglobin 12.0 - 16.0 g/dL 10.1(L) 11.0(L) 12.2  Hematocrit 35.0 - 47.0 % 31.7(L) 34.8(L) 38.1  Platelets 150 - 440 K/uL 108(L) 92(L) 160   Blood glucose values reviewed in glucose accordion view  ASSESSMENT:  1. Hypoglycemia - secondary to septic shock, resolved with stress dose steroids.  2. History of Type 2 Diabetes   RECOMMENDATIONS:   Would discontinue D10 infusion now  that BG has stabilized. Continue to follow hypoglycemia protocol. Fingerstick BG every 4 hours. Continue IV hydrocortisone 50 mg every 8 hours. Treat underlying infection as you are. Will follow along. Thank you for this consult  Atha Starks, MD [facility] Endocrinology

## 2015-11-15 NOTE — Progress Notes (Signed)
[facility] Progess Note    ASSESSMENT/PLAN    ASSESSMENT / PLAN:  55 YO female with ESRD on dialysis presenting with acute hypoxic respiratory failure secondary to bilateral pneumonia-aspiration; septic shock and severe hypoglycemia; ESRD on HD.    PULMONARY A: Acute hypoxic respiratory failure secondary to pneumonia and pulmonary edema Bacteria versus aspiration pneumonia H/O COPD Continue ventilator dependence.  P:  -Full vent support, continue respiratory failure, doubt that she is ready for weaning today. -wean down steroids.   CARDIOVASCULAR A:  Septic shock Systolic and diastolic heart failure H/o CAD H/o Hypertension P:  -Levophed gtt and titrate to MAP goal of >65 -Hold antihypertensive 2/2 shock -Hemodynamics per ICU protocol -Continue plavix  RENAL A:  ESRD on dialysis Hyperkalemia P:  -Nephrology following -Dialysis per nephrology -Monitor and correct electrolyte imbalances  GASTROINTESTINAL A:  Nausea/vomiting P:  -OGT to intermittent suction -Antiemetics prn -PPI for GI prophylaxis -TFs as tolerated  HEMATOLOGIC A:  Anemia of chronic disease P:  -Monitor H/H and transfuse per ICU protocol  INFECTIOUS A:  Sepsis-likely source respiratory Suspect aspiration pneumonia P:  -Empiric antibiotics, can discontinue vancomycin IV. Due to negative PCR. -f/u Cultures  CULTURES: Blood 02/22> negative thus far. Urine 02/22> negative thus far. Sputum 02/22>> pending MRSA Screen negative C. difficile PCR 2/23>> negative  ANTIBIOTICS: Zosyn 02/22>> Vancomycin  IV 02/22>> 2/24. Vancomycin by mouth 2/23>>  ENDOCRINE A:  Refractory hypoglycemia, now improved on steroids and D10 Type 2 DM Hyponatremia, improved from 127 to 129.  P:  -Blood glucose monitoring q4h  -No sliding scale coverage for now -will discontinue D10 per endocrine recs.   NEUROLOGIC A:  Acute metabolic  encephalopathy P:  RASS goal: -1 -Fentanyl and versed for vent sedation   SIGNIFICANT EVENTS: 02/20>Admitted 02/21>Transferred to ICU 02/22>Intubated  LINES/TUBES: Left subclavian TLC 02/22>> ETT 02/22>>  STUDIES:  Last Echo 09/2015>EF 40%, Left ventricular dysfunction/hypertrophy, systolic and diastolic function(grade 1), moderate MR, severe TR and moderate pleural effusions --------------------------------------------------------  ---------------------------------------   ----------------------------------------   Name: [name] MRN: [id number] DOB: Dec 12, 1960    ADMISSION DATE:  11/12/2015   SUBJECTIVE:   Pt currently on the ventilator, can not provide history or review of systems.   VITAL SIGNS: Temp:  [97.6 F (36.4 C)-101.1 F (38.4 C)] 97.9 F (36.6 C) (02/23 1600) Pulse Rate:  [79-97] 81 (02/23 1800) Resp:  [17-24] 20 (02/23 1800) BP: (94-141)/(44-56) 103/48 mmHg (02/23 1800) SpO2:  [96 %-100 %] 97 % (02/23 1800) FiO2 (%):  [40 %] 40 % (02/23 1534) HEMODYNAMICS:   VENTILATOR SETTINGS: Vent Mode:  [-] PRVC FiO2 (%):  [40 %] 40 % Set Rate:  [18 bmp] 18 bmp Vt Set:  [450 mL] 450 mL PEEP:  [5 cmH20] 5 cmH20 Plateau Pressure:  [24 cmH20-29 cmH20] 24 cmH20 INTAKE / OUTPUT:  Intake/Output Summary (Last 24 hours) at 11/15/15 1954 Last data filed at 11/15/15 1800  Gross per 24 hour  Intake 1955.75 ml  Output      0 ml  Net 1955.75 ml    PHYSICAL EXAMINATION: Physical Examination:   VS: BP 103/48 mmHg  Pulse 81  Temp(Src) 97.9 F (36.6 C) (Oral)  Resp 20  Ht 5\' 5"  (1.651 m)  Wt 161 lb 9.6 oz (73.301 kg)  BMI 26.89 kg/m2  SpO2 97%  General Appearance: No distress  Neuro:without focal findings, mentalREDUCED, CURRENTLY ON VENT: PERRLA, EOM intact. Pulmonary: normal breath sounds   CardiovascularNormal S1,S2.  No m/r/g.  Abdomen: Benign, Soft, non-tender. Renal:  No costovertebral tenderness  GU:  Not performed at this time. Endocrine:  No evident thyromegaly. Skin:   warm, no rashes, no ecchymosis  Extremities: normal, no cyanosis, clubbing.   LABS:   LABORATORY PANEL:   CBC  Recent Labs Lab 11/15/15 0549  WBC 3.9  HGB 10.1*  HCT 31.7*  PLT 108*    Chemistries   Recent Labs Lab 11/13/15 1425 11/15/15 0549  NA 133* 129*  K 5.0 3.4*  CL 93* 92*  CO2 32 27  GLUCOSE 78 146*  BUN 22* 33*  CREATININE 4.13* 4.02*  CALCIUM 8.4* 7.6*  MG  --  1.6*  PHOS  --  2.2*  AST 20  --   ALT 11*  --   ALKPHOS 144*  --   BILITOT 1.1  --      Recent Labs Lab 11/14/15 2002 11/15/15 0005 11/15/15 0415 11/15/15 0826 11/15/15 1210 11/15/15 1703  GLUCAP 91 109* 130* 153* 123* 96    Recent Labs Lab 11/13/15 2118 11/14/15 1415  PHART 7.44 7.48*  PCO2ART 49* 40  PO2ART 73* 65*    Recent Labs Lab 11/12/15 0622 11/13/15 0744 11/13/15 1425  AST 18 25 20   ALT 12* 13* 11*  ALKPHOS 125 137* 144*  BILITOT 1.0 0.9 1.1  ALBUMIN 3.2* 3.0* 3.1*    Cardiac Enzymes  Recent Labs Lab 11/12/15 0622  TROPONINI <0.03    RADIOLOGY:  Dg Chest 1 View  11/14/2015  CLINICAL DATA:  Pulmonary edema.  History of CHF and asthma. EXAM: CHEST 1 VIEW COMPARISON:  Portable chest x-ray of November 13, 2015 FINDINGS: The lungs remain hypoinflated. Moderate-sized bilateral pleural effusions remain. The retrocardiac region on the left remains dense. The cardiac silhouette remains enlarged and the pulmonary vascularity remains engorged. The mediastinum is normal in width. The observed bony thorax is unremarkable. A vascular graft is present in the right antecubital and left axillary regions. IMPRESSION: Congestive heart failure with pulmonary interstitial edema and bilateral pleural effusions. There has not been significant interval change since yesterday's study. Density at both lung bases may reflect atelectasis or pneumonia. Electronically Signed   By: David  Martinique M.D.   On: 11/14/2015 08:55   Dg Abd 1 View  11/14/2015   CLINICAL DATA:  OG tube placement. EXAM: ABDOMEN - 1 VIEW COMPARISON:  None. FINDINGS: OG tube is in place in good position with the side-port in the stomach. IMPRESSION: As above. Electronically Signed   By: Inge Rise M.D.   On: 11/14/2015 13:23   Dg Chest Port 1 View  11/15/2015  CLINICAL DATA:  Acute respiratory failure EXAM: PORTABLE CHEST 1 VIEW COMPARISON:  11/14/2015 FINDINGS: Endotracheal tube in good position. Central venous catheter tip near the cavoatrial junction unchanged. No pneumothorax. NG tube in the stomach Progression of diffuse bilateral airspace disease. Bilateral pleural effusions and bibasilar atelectasis. IMPRESSION: Endotracheal tube remains in good position Worsening bilateral airspace disease most likely heart failure with edema. Electronically Signed   By: Franchot Gallo M.D.   On: 11/15/2015 06:40   Dg Chest Port 1 View  11/14/2015  CLINICAL DATA:  Status post central line placement EXAM: PORTABLE CHEST 1 VIEW COMPARISON:  11/14/2015 FINDINGS: Cardiac shadow is mildly enlarged. Bilateral pleural effusions and likely bibasilar infiltrate is again identified. Changes consistent with prior stenting in both upper extremities is seen. Additionally the left-sided stenting extends centrally into the subclavian vessel. A new left subclavian central venous line is noted with catheter  tip in the superior right atrium. No pneumothorax is noted. Endotracheal tube and nasogastric catheter are noted in satisfactory position. The endotracheal tube is approximately 5 cm above the carina. No bony abnormality is seen. IMPRESSION: Status post left central venous line without pneumothorax. Interval placement of endotracheal tube and nasogastric catheter. Bibasilar changes stable from the prior exam. Electronically Signed   By: Inez Catalina M.D.   On: 11/14/2015 13:27       --Marda Stalker, MD.  Pager [phone] [facility] Office Number: [id number]  Patricia Pesa, M.D.  Vilinda Boehringer, M.D.  Merton Border, M.D  [place].  I have personally obtained a history, examined the patient, evaluated laboratory and imaging results, formulated the assessment and plan and placed orders. The Patient requires high complexity decision making for assessment and support, frequent evaluation and titration of therapies, application of advanced monitoring technologies and extensive interpretation of multiple databases. The patient has critical illness that could lead imminently to failure of 1 or more organ systems and requires the highest level of physician preparedness to intervene.  Critical Care Time devoted to patient care services described in this note is 35 minutes and is exclusive of time spent in procedures.

## 2015-11-15 NOTE — Progress Notes (Signed)
Nutrition Follow-up   INTERVENTION:   EN: received verbal order from MD Ramachandran to start TF; recommend starting Vital 1.5 at rate of 20 ml/hr with goal of 45 ml/hr with Prostat TID; meets 100% estimated protein and calorie needs.    NUTRITION DIAGNOSIS:   Inadequate oral intake related to altered GI function as evidenced by per patient/family report. Being addressed via TF while on vent  GOAL:   Patient will meet greater than or equal to 90% of their needs  MONITOR:    (Energy intake, Digestive system)  REASON FOR ASSESSMENT:   Consult Enteral/tube feeding initiation and management  ASSESSMENT:    Pt developed respiratory distress, lethargy, fever and hypoglycemia; transferred to ICU, initially on Bipap but required intubation; pt on D10 for hypoglycemia, on levophed at 5 mcg/min  Diet Order:  Diet NPO time specified   Digestive System: +BM 2/23, abdomen soft/obese, BS hypoactive   Recent Labs Lab 11/13/15 0744 11/13/15 1425 11/15/15 0549  NA 134* 133* 129*  K 5.4* 5.0 3.4*  CL 96* 93* 92*  CO2 29 32 27  BUN 20 22* 33*  CREATININE 3.78* 4.13* 4.02*  CALCIUM 8.5* 8.4* 7.6*  MG  --   --  1.6*  PHOS  --   --  2.2*  GLUCOSE 84 78 146*    Glucose Profile:   Recent Labs  11/15/15 0415 11/15/15 0826 11/15/15 1210  GLUCAP 130* 153* 123*   Meds: MVI, creon, reglan, senna-docusate, lactobacillus  Height:   Ht Readings from Last 1 Encounters:  11/12/15 5\' 5"  (1.651 m)    Weight:   Wt Readings from Last 1 Encounters:  11/12/15 161 lb 9.6 oz (73.301 kg)    BMI:  Body mass index is 26.89 kg/(m^2).  Estimated Nutritional Needs:   Kcal:  1887 kcals (Ve: 9.1, Tmax: 39.2) using wt of 73 kg  Protein:  110-146 g(1.5-2.0 g/kg)   Fluid:  (1073ml + UOP)  EDUCATION NEEDS:   No education needs identified at this time  HIGH Care Level  Kerman Passey MS, RD, LDN [phone] Pager  [phone] Weekend/On-Call Pager

## 2015-11-15 NOTE — Progress Notes (Signed)
Subjective:  Intubated 2/22 Levophed - being weaned off Last evening T max 102.5 I/o cath yesterday 160 cc Bladder scan is neg today D 10 - rate is being lowered   Objective:  Vital signs in last 24 hours:  Temp:  [97.6 F (36.4 C)-102.5 F (39.2 C)] 97.6 F (36.4 C) (02/23 0700) Pulse Rate:  [70-97] 91 (02/23 0900) Resp:  [0-28] 20 (02/23 0900) BP: (72-141)/(34-56) 111/46 mmHg (02/23 0900) SpO2:  [89 %-100 %] 96 % (02/23 0900) FiO2 (%):  [40 %] 40 % (02/23 0826)  Weight change:  Filed Weights   11/12/15 1800 11/12/15 2115 11/12/15 2208  Weight: 76 kg (167 lb 8.8 oz) 76 kg (167 lb 8.8 oz) 73.301 kg (161 lb 9.6 oz)    Intake/Output:    Intake/Output Summary (Last 24 hours) at 11/15/15 1017 Last data filed at 11/15/15 0900  Gross per 24 hour  Intake 3015.85 ml  Output      0 ml  Net 3015.85 ml     Physical Exam: General: Critically ill appearing  HEENT Anicteric, dry oral mucus membranes  Neck supple  Pulm/lungs Vent assisted  CVS/Heart No rub  Abdomen:  Soft, Non tender  Extremities: + dependent edema  Neurologic: sedated  Skin: No acute rashes  Access: AVF       Basic Metabolic Panel:   Recent Labs Lab 11/09/15 1328 11/12/15 0622 11/13/15 0744 11/13/15 1425 11/15/15 0549  NA 139 134* 134* 133* 129*  K 4.6 5.9* 5.4* 5.0 3.4*  CL 99* 95* 96* 93* 92*  CO2 30 30 29  32 27  GLUCOSE 36* 62* 84 78 146*  BUN 19 38* 20 22* 33*  CREATININE 3.02* 5.19* 3.78* 4.13* 4.02*  CALCIUM 8.4* 9.4 8.5* 8.4* 7.6*  MG  --   --   --   --  1.6*  PHOS  --   --   --   --  2.2*     CBC:  Recent Labs Lab 11/09/15 1131 11/12/15 0556 11/14/15 0053 11/15/15 0549  WBC 5.2 8.7 3.0* 3.9  HGB 12.1 12.2 11.0* 10.1*  HCT 38.3 38.1 34.8* 31.7*  MCV 86.4 87.3 88.5 85.8  PLT 190 160 92* 108*      Microbiology:  Recent Results (from the past 720 hour(s))  MRSA PCR Screening     Status: None   Collection Time: 11/13/15  9:46 PM  Result Value Ref Range Status    MRSA by PCR NEGATIVE NEGATIVE Final    Comment:        The GeneXpert MRSA Assay (FDA approved for NASAL specimens only), is one component of a comprehensive MRSA colonization surveillance program. It is not intended to diagnose MRSA infection nor to guide or monitor treatment for MRSA infections.   Culture, blood (Routine X 2) w Reflex to ID Panel     Status: None (Preliminary result)   Collection Time: 11/14/15 12:53 AM  Result Value Ref Range Status   Specimen Description BLOOD RIGHT HAND  Final   Special Requests BOTTLES DRAWN AEROBIC AND ANAEROBIC 5ML  Final   Culture NO GROWTH 1 DAY  Final   Report Status PENDING  Incomplete  Culture, blood (Routine X 2) w Reflex to ID Panel     Status: None (Preliminary result)   Collection Time: 11/14/15  7:14 AM  Result Value Ref Range Status   Specimen Description BLOOD RIGHT HAND  Final   Special Requests   Final    BOTTLES DRAWN AEROBIC AND ANAEROBIC  AER 4ML ANA 2ML   Culture NO GROWTH < 24 HOURS  Final   Report Status PENDING  Incomplete  Culture, expectorated sputum-assessment     Status: None   Collection Time: 11/14/15  4:20 PM  Result Value Ref Range Status   Specimen Description SPU  Final   Special Requests NONE  Final   Sputum evaluation THIS SPECIMEN IS ACCEPTABLE FOR SPUTUM CULTURE  Final   Report Status 11/14/2015 FINAL  Final  Culture, respiratory (NON-Expectorated)     Status: None (Preliminary result)   Collection Time: 11/14/15  4:20 PM  Result Value Ref Range Status   Specimen Description SPU  Final   Special Requests NONE Reflexed from [id number]  Final   Gram Stain PENDING  Incomplete   Culture HOLDING FOR POSSIBLE PATHOGEN  Final   Report Status PENDING  Incomplete  Clostridium Difficile by PCR     Status: None   Collection Time: 11/15/15  7:36 AM  Result Value Ref Range Status   Toxigenic C Difficile by pcr NEGATIVE NEGATIVE Final    Coagulation Studies: No results for input(s): LABPROT, INR in the last 72  hours.  Urinalysis: No results for input(s): COLORURINE, LABSPEC, PHURINE, GLUCOSEU, HGBUR, BILIRUBINUR, KETONESUR, PROTEINUR, UROBILINOGEN, NITRITE, LEUKOCYTESUR in the last 72 hours.  Invalid input(s): APPERANCEUR    Imaging: Dg Chest 1 View  11/14/2015  CLINICAL DATA:  Pulmonary edema.  History of CHF and asthma. EXAM: CHEST 1 VIEW COMPARISON:  Portable chest x-ray of November 13, 2015 FINDINGS: The lungs remain hypoinflated. Moderate-sized bilateral pleural effusions remain. The retrocardiac region on the left remains dense. The cardiac silhouette remains enlarged and the pulmonary vascularity remains engorged. The mediastinum is normal in width. The observed bony thorax is unremarkable. A vascular graft is present in the right antecubital and left axillary regions. IMPRESSION: Congestive heart failure with pulmonary interstitial edema and bilateral pleural effusions. There has not been significant interval change since yesterday's study. Density at both lung bases may reflect atelectasis or pneumonia. Electronically Signed   By: David  Martinique M.D.   On: 11/14/2015 08:55   Dg Chest 1 View  11/14/2015  CLINICAL DATA:  End-stage renal disease and anemia EXAM: CHEST 1 VIEW COMPARISON:  November 12, 2015 FINDINGS: There is cardiomegaly with pulmonary venous hypertension. There are bilateral pleural effusions. There is mild bibasilar interstitial edema. There is atelectatic change in the lung bases as well. There is a left axillary region stent. IMPRESSION: Findings consistent with congestive heart failure. Bibasilar atelectatic change; superimposed early pneumonia in the bases cannot be excluded radiographically. Electronically Signed   By: Lowella Grip III M.D.   On: 11/14/2015 08:52   Dg Abd 1 View  11/14/2015  CLINICAL DATA:  OG tube placement. EXAM: ABDOMEN - 1 VIEW COMPARISON:  None. FINDINGS: OG tube is in place in good position with the side-port in the stomach. IMPRESSION: As above.  Electronically Signed   By: Inge Rise M.D.   On: 11/14/2015 13:23   Dg Chest Port 1 View  11/15/2015  CLINICAL DATA:  Acute respiratory failure EXAM: PORTABLE CHEST 1 VIEW COMPARISON:  11/14/2015 FINDINGS: Endotracheal tube in good position. Central venous catheter tip near the cavoatrial junction unchanged. No pneumothorax. NG tube in the stomach Progression of diffuse bilateral airspace disease. Bilateral pleural effusions and bibasilar atelectasis. IMPRESSION: Endotracheal tube remains in good position Worsening bilateral airspace disease most likely heart failure with edema. Electronically Signed   By: Franchot Gallo M.D.   On: 11/15/2015  06:40   Dg Chest Port 1 View  11/14/2015  CLINICAL DATA:  Status post central line placement EXAM: PORTABLE CHEST 1 VIEW COMPARISON:  11/14/2015 FINDINGS: Cardiac shadow is mildly enlarged. Bilateral pleural effusions and likely bibasilar infiltrate is again identified. Changes consistent with prior stenting in both upper extremities is seen. Additionally the left-sided stenting extends centrally into the subclavian vessel. A new left subclavian central venous line is noted with catheter tip in the superior right atrium. No pneumothorax is noted. Endotracheal tube and nasogastric catheter are noted in satisfactory position. The endotracheal tube is approximately 5 cm above the carina. No bony abnormality is seen. IMPRESSION: Status post left central venous line without pneumothorax. Interval placement of endotracheal tube and nasogastric catheter. Bibasilar changes stable from the prior exam. Electronically Signed   By: Inez Catalina M.D.   On: 11/14/2015 13:27     Medications:   . dextrose 75 mL/hr at 11/15/15 0700  . fentaNYL infusion INTRAVENOUS 25 mcg/hr (11/15/15 0925)  . norepinephrine (LEVOPHED) Adult infusion 5 mcg/min (11/15/15 0850)   . amiodarone  400 mg Per NG tube Daily  . antiseptic oral rinse  7 mL Mouth Rinse 10 times per day  . aspirin   81 mg Per NG tube Daily  . atorvastatin  20 mg Per NG tube QPM  . bacitracin   Topical BID  . chlorhexidine gluconate  15 mL Mouth Rinse BID  . cholecalciferol  400 Units Per Tube Daily  . citalopram  20 mg Per NG tube Daily  . clopidogrel  75 mg Per NG tube Daily  . feeding supplement (NEPRO CARB STEADY)  237 mL Per Tube BID BM  . heparin  5,000 Units Subcutaneous 3 times per day  . hydrocortisone sod succinate (SOLU-CORTEF) inj  25 mg Intravenous Q12H  . ipratropium-albuterol  3 mL Nebulization Q6H  . lipase/protease/amylase  12,000 Units Oral TID WC  . multivitamin  1 tablet Oral QHS  . pantoprazole (PROTONIX) IV  40 mg Intravenous Q24H  . piperacillin-tazobactam (ZOSYN)  IV  3.375 g Intravenous Q12H  . sevelamer carbonate  800 mg Per NG tube TID WC  . vitamin B-12  1,000 mcg Per NG tube Daily   acetaminophen **OR** acetaminophen, albuterol, alum & mag hydroxide-simeth, bisacodyl, dextrose, fentaNYL, iohexol, metoCLOPramide (REGLAN) injection, midazolam, midazolam, nitroGLYCERIN, promethazine, senna-docusate, vancomycin  Assessment/ Plan:  55 y.o. 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    [facility] MWF [place]  1. End Stage Renal Disease:  - Dialyzed monday, 3 L removed.  -  Plan for HD tomorrow  2. Anemia of chronic kidney disease:  - hemoglobin currently  10.1 -  Will restart EPO if Hgb trends down  3.  Acute resp failure - combination of Pneumonia, Anasarca and acute pulm edema -  Vent assisted  4.  Hyperkalemia  - corrected  Low K bath  5. Gastroparesis, Nausea and vomiting - Phenergan liquid - Reglan  6. Hypoglycemia - likely due to sepsis - requiring D10 administration     LOS:  Javoris Star 2/23/201710:17 AM

## 2015-11-16 ENCOUNTER — Inpatient Hospital Stay: Payer: Medicare Other

## 2015-11-16 LAB — PHOSPHORUS: Phosphorus: 2 mg/dL — ABNORMAL LOW (ref 2.5–4.6)

## 2015-11-16 LAB — MAGNESIUM: Magnesium: 1.9 mg/dL (ref 1.7–2.4)

## 2015-11-16 LAB — GLUCOSE, CAPILLARY
Glucose-Capillary: 156 mg/dL — ABNORMAL HIGH (ref 65–99)
Glucose-Capillary: 166 mg/dL — ABNORMAL HIGH (ref 65–99)
Glucose-Capillary: 205 mg/dL — ABNORMAL HIGH (ref 65–99)
Glucose-Capillary: 218 mg/dL — ABNORMAL HIGH (ref 65–99)
Glucose-Capillary: 292 mg/dL — ABNORMAL HIGH (ref 65–99)
Glucose-Capillary: 65 mg/dL (ref 65–99)
Glucose-Capillary: 73 mg/dL (ref 65–99)
Glucose-Capillary: 99 mg/dL (ref 65–99)

## 2015-11-16 LAB — CBC
HCT: 28.6 % — ABNORMAL LOW (ref 35.0–47.0)
Hemoglobin: 9.3 g/dL — ABNORMAL LOW (ref 12.0–16.0)
MCH: 27.2 pg (ref 26.0–34.0)
MCHC: 32.5 g/dL (ref 32.0–36.0)
MCV: 83.6 fL (ref 80.0–100.0)
Platelets: 81 10*3/uL — ABNORMAL LOW (ref 150–440)
RBC: 3.42 MIL/uL — ABNORMAL LOW (ref 3.80–5.20)
RDW: 19.6 % — ABNORMAL HIGH (ref 11.5–14.5)
WBC: 9.8 10*3/uL (ref 3.6–11.0)

## 2015-11-16 LAB — BASIC METABOLIC PANEL
Anion gap: 11 (ref 5–15)
BUN: 44 mg/dL — ABNORMAL HIGH (ref 6–20)
CO2: 27 mmol/L (ref 22–32)
Calcium: 7.8 mg/dL — ABNORMAL LOW (ref 8.9–10.3)
Chloride: 89 mmol/L — ABNORMAL LOW (ref 101–111)
Creatinine, Ser: 4.54 mg/dL — ABNORMAL HIGH (ref 0.44–1.00)
GFR calc Af Amer: 11 mL/min — ABNORMAL LOW (ref >60)
GFR calc non Af Amer: 10 mL/min — ABNORMAL LOW (ref >60)
Glucose, Bld: 115 mg/dL — ABNORMAL HIGH (ref 65–99)
Potassium: 3.6 mmol/L (ref 3.5–5.1)
Sodium: 127 mmol/L — ABNORMAL LOW (ref 135–145)

## 2015-11-16 MED ORDER — BACID PO TABS
2.0000 | ORAL_TABLET | Freq: Three times a day (TID) | ORAL | Status: DC
  Filled 2015-11-16 (×3): qty 2

## 2015-11-16 MED ORDER — DEXTROSE 5 % IV SOLN
20.0000 mmol | Freq: Once | INTRAVENOUS | Status: AC
  Administered 2015-11-16: 20 mmol via INTRAVENOUS
  Filled 2015-11-16: qty 6.67

## 2015-11-16 MED ORDER — RISAQUAD PO CAPS
2.0000 | ORAL_CAPSULE | Freq: Three times a day (TID) | ORAL | Status: DC
  Administered 2015-11-16 – 2015-12-01 (×39): 2 via ORAL
  Filled 2015-11-16 (×37): qty 2

## 2015-11-16 NOTE — Progress Notes (Signed)
[facility] NOTE  Pharmacy Consult for electrolyte replacement     Allergies  Allergen Reactions  . Compazine [Prochlorperazine Edisylate] Anaphylaxis and Nausea And Vomiting  . Ace Inhibitors Swelling  . Ativan [Lorazepam] Other (See Comments)    Reaction:  Hallucinations and headaches  . Codeine Nausea And Vomiting  . Gabapentin Other (See Comments)    Reaction:  Unknown   . Losartan Other (See Comments)    Reaction:  Unknown   . Ondansetron Other (See Comments)    Reaction:  Unknown   . Prochlorperazine Other (See Comments)    Reaction:  Unknown   . Scopolamine Other (See Comments)    Reaction:  Unknown   . Zofran [Ondansetron Hcl] Other (See Comments)    Reaction:  hallucinations   . Oxycodone Anxiety  . Tape Rash    Patient Measurements: Height: 5\' 5"  (165.1 cm) Weight: 167 lb 12.3 oz (76.1 kg) IBW/kg (Calculated) : 57   Vital Signs: Temp: 98.9 F (37.2 C) (02/24 1206) Temp Source: Axillary (02/24 1206) BP: 120/45 mmHg (02/24 1415) Pulse Rate: 77 (02/24 1300) Intake/Output from previous day: 02/23 0701 - 02/24 0700 In: 792.6 [I.V.:547.9; NG/GT:144.8; IV Piggyback:100] Out: -  Intake/Output from this shift: Total I/O In: 907 [I.V.:80.3; NG/GT:270; IV Piggyback:556.7] Out: -  Vent settings for last 24 hours: Vent Mode:  [-] PRVC FiO2 (%):  [40 %] 40 % Set Rate:  [18 bmp] 18 bmp Vt Set:  [450 mL] 450 mL PEEP:  [5 cmH20] 5 cmH20 Plateau Pressure:  [21 cmH20-28 cmH20] 21 cmH20  Labs:  Recent Labs  11/14/15 0053 11/15/15 0549 11/16/15 0453  WBC 3.0* 3.9 9.8  HGB 11.0* 10.1* 9.3*  HCT 34.8* 31.7* 28.6*  PLT 92* 108* 81*  CREATININE  --  4.02* 4.54*  MG  --  1.6* 1.9  PHOS  --  2.2* 2.0*   Estimated Creatinine Clearance: 14.1 mL/min (by C-G formula based on Cr of 4.54).   Recent Labs  11/16/15 0804 11/16/15 0956 11/16/15 1207  GLUCAP 156* 205* 218*    Microbiology: Recent Results (from the past 720 hour(s))  MRSA  PCR Screening     Status: None   Collection Time: 11/13/15  9:46 PM  Result Value Ref Range Status   MRSA by PCR NEGATIVE NEGATIVE Final    Comment:        The GeneXpert MRSA Assay (FDA approved for NASAL specimens only), is one component of a comprehensive MRSA colonization surveillance program. It is not intended to diagnose MRSA infection nor to guide or monitor treatment for MRSA infections.   Culture, blood (Routine X 2) w Reflex to ID Panel     Status: None (Preliminary result)   Collection Time: 11/14/15 12:53 AM  Result Value Ref Range Status   Specimen Description BLOOD RIGHT HAND  Final   Special Requests BOTTLES DRAWN AEROBIC AND ANAEROBIC 5ML  Final   Culture NO GROWTH 2 DAYS  Final   Report Status PENDING  Incomplete  Culture, blood (Routine X 2) w Reflex to ID Panel     Status: None (Preliminary result)   Collection Time: 11/14/15  7:14 AM  Result Value Ref Range Status   Specimen Description BLOOD RIGHT HAND  Final   Special Requests   Final    BOTTLES DRAWN AEROBIC AND ANAEROBIC AER 4ML ANA 2ML   Culture NO GROWTH 2 DAYS  Final   Report Status PENDING  Incomplete  Urine culture  Status: None   Collection Time: 11/14/15 10:16 AM  Result Value Ref Range Status   Specimen Description URINE, RANDOM  Final   Special Requests NONE  Final   Culture NO GROWTH 1 DAY  Final   Report Status 11/15/2015 FINAL  Final  Culture, expectorated sputum-assessment     Status: None   Collection Time: 11/14/15  4:20 PM  Result Value Ref Range Status   Specimen Description SPU  Final   Special Requests NONE  Final   Sputum evaluation THIS SPECIMEN IS ACCEPTABLE FOR SPUTUM CULTURE  Final   Report Status 11/14/2015 FINAL  Final  Culture, respiratory (NON-Expectorated)     Status: None (Preliminary result)   Collection Time: 11/14/15  4:20 PM  Result Value Ref Range Status   Specimen Description SPU  Final   Special Requests NONE Reflexed from [id number]  Final   Gram Stain    Final    MANY WBC SEEN NO SQUAMOUS EPITHELIAL CELLS PRESENT FEW GRAM POSITIVE COCCI    Culture HOLDING FOR POSSIBLE PATHOGEN  Final   Report Status PENDING  Incomplete  C difficile quick scan w PCR reflex     Status: Abnormal   Collection Time: 11/15/15  7:36 AM  Result Value Ref Range Status   C Diff antigen POSITIVE (A) NEGATIVE Final   C Diff toxin NEGATIVE NEGATIVE Final   C Diff interpretation   Final    Negative for toxigenic C. difficile. Toxin gene and active toxin production not detected. May be a nontoxigenic strain of C. difficile bacteria present, lacking the ability to produce toxin.  Clostridium Difficile by PCR     Status: None   Collection Time: 11/15/15  7:36 AM  Result Value Ref Range Status   Toxigenic C Difficile by pcr NEGATIVE NEGATIVE Final    Medications:  Scheduled:  . acidophilus  2 capsule Oral TID  . amiodarone  400 mg Per NG tube Daily  . antiseptic oral rinse  7 mL Mouth Rinse 10 times per day  . aspirin  81 mg Per NG tube Daily  . atorvastatin  20 mg Per NG tube QPM  . bacitracin   Topical BID  . chlorhexidine gluconate  15 mL Mouth Rinse BID  . cholecalciferol  400 Units Per Tube Daily  . citalopram  20 mg Per NG tube Daily  . clopidogrel  75 mg Per NG tube Daily  . feeding supplement (PRO-STAT SUGAR FREE 64)  30 mL Per Tube TID  . free water  100 mL Per Tube 3 times per day  . hydrocortisone sod succinate (SOLU-CORTEF) inj  25 mg Intravenous Q12H  . ipratropium-albuterol  3 mL Nebulization Q6H  . lipase/protease/amylase  12,000 Units Oral TID WC  . metoCLOPramide (REGLAN) injection  5 mg Intravenous 4 times per day  . multivitamin  1 tablet Oral QHS  . pantoprazole (PROTONIX) IV  40 mg Intravenous Q24H  . piperacillin-tazobactam (ZOSYN)  IV  3.375 g Intravenous Q12H  . vancomycin  125 mg Oral 4 times per day  . vitamin B-12  1,000 mcg Per NG tube Daily   Infusions:  . feeding supplement (VITAL 1.5 CAL) 1,000 mL (11/16/15 0700)  . fentaNYL  infusion INTRAVENOUS Stopped (11/16/15 0805)  . norepinephrine (LEVOPHED) Adult infusion 8 mcg/min (11/16/15 0805)    Assessment: Pharmacy consulted for electrolyte management for 55 yo female ICU patient requiring mechanical ventilation, tube feeds, and dialysis. Patient is not receiving scheduled dialysis - next dialysis is planned for  2/24.     Plan:  Phos is low at 2.0 so will order Kphos 20 mmol iv x 1 then f/u AM labs.   Pharmacy will continue to monitor and adjust per consult.    Ulice Dash D 11/16/2015,3:30 PM

## 2015-11-16 NOTE — Progress Notes (Signed)
Subjective:  Intubated 2/22 Remains critically ill Patient seen during dialysis Tolerating well    HEMODIALYSIS FLOWSHEET:  Blood Flow Rate (mL/min): 400 mL/min Arterial Pressure (mmHg): -200 mmHg Venous Pressure (mmHg): 180 mmHg Transmembrane Pressure (mmHg): 50 mmHg Ultrafiltration Rate (mL/min): 440 mL/min Dialysate Flow Rate (mL/min): 600 ml/min Conductivity: Machine : 14 Conductivity: Machine : 14 Dialysis Fluid Bolus: Normal Saline Bolus Amount (mL): 250 mL Intra-Hemodialysis Comments: 1111 (ultasound at bs)     Objective:  Vital signs in last 24 hours:  Temp:  [97.9 F (36.6 C)-98.9 F (37.2 C)] 98.9 F (37.2 C) (02/24 1206) Pulse Rate:  [72-83] 77 (02/24 1300) Resp:  [19-27] 27 (02/24 1300) BP: (94-120)/(44-50) 120/45 mmHg (02/24 1415) SpO2:  [96 %-100 %] 100 % (02/24 1300) FiO2 (%):  [40 %] 40 % (02/24 1300) Weight:  [76.1 kg (167 lb 12.3 oz)] 76.1 kg (167 lb 12.3 oz) (02/24 0517)  Weight change:  Filed Weights   11/12/15 2115 11/12/15 2208 11/16/15 0517  Weight: 76 kg (167 lb 8.8 oz) 73.301 kg (161 lb 9.6 oz) 76.1 kg (167 lb 12.3 oz)    Intake/Output:    Intake/Output Summary (Last 24 hours) at 11/16/15 1452 Last data filed at 11/16/15 1300  Gross per 24 hour  Intake 1202.82 ml  Output      0 ml  Net 1202.82 ml     Physical Exam: General: Critically ill appearing  HEENT Anicteric, dry oral mucus membranes  Neck supple  Pulm/lungs Vent assisted  CVS/Heart No rub  Abdomen:  Soft, Non tender  Extremities: + dependent edema  Neurologic: sedated  Skin: No acute rashes  Access: AVF       Basic Metabolic Panel:   Recent Labs Lab 11/12/15 0622 11/13/15 0744 11/13/15 1425 11/15/15 0549 11/16/15 0453  NA 134* 134* 133* 129* 127*  K 5.9* 5.4* 5.0 3.4* 3.6  CL 95* 96* 93* 92* 89*  CO2 30 29 32 27 27  GLUCOSE 62* 84 78 146* 115*  BUN 38* 20 22* 33* 44*  CREATININE 5.19* 3.78* 4.13* 4.02* 4.54*  CALCIUM 9.4 8.5* 8.4* 7.6* 7.8*  MG   --   --   --  1.6* 1.9  PHOS  --   --   --  2.2* 2.0*     CBC:  Recent Labs Lab 11/12/15 0556 11/14/15 0053 11/15/15 0549 11/16/15 0453  WBC 8.7 3.0* 3.9 9.8  HGB 12.2 11.0* 10.1* 9.3*  HCT 38.1 34.8* 31.7* 28.6*  MCV 87.3 88.5 85.8 83.6  PLT 160 92* 108* 81*      Microbiology:  Recent Results (from the past 720 hour(s))  MRSA PCR Screening     Status: None   Collection Time: 11/13/15  9:46 PM  Result Value Ref Range Status   MRSA by PCR NEGATIVE NEGATIVE Final    Comment:        The GeneXpert MRSA Assay (FDA approved for NASAL specimens only), is one component of a comprehensive MRSA colonization surveillance program. It is not intended to diagnose MRSA infection nor to guide or monitor treatment for MRSA infections.   Culture, blood (Routine X 2) w Reflex to ID Panel     Status: None (Preliminary result)   Collection Time: 11/14/15 12:53 AM  Result Value Ref Range Status   Specimen Description BLOOD RIGHT HAND  Final   Special Requests BOTTLES DRAWN AEROBIC AND ANAEROBIC 5ML  Final   Culture NO GROWTH 2 DAYS  Final   Report Status PENDING  Incomplete  Culture, blood (Routine X 2) w Reflex to ID Panel     Status: None (Preliminary result)   Collection Time: 11/14/15  7:14 AM  Result Value Ref Range Status   Specimen Description BLOOD RIGHT HAND  Final   Special Requests   Final    BOTTLES DRAWN AEROBIC AND ANAEROBIC AER 4ML ANA 2ML   Culture NO GROWTH 2 DAYS  Final   Report Status PENDING  Incomplete  Urine culture     Status: None   Collection Time: 11/14/15 10:16 AM  Result Value Ref Range Status   Specimen Description URINE, RANDOM  Final   Special Requests NONE  Final   Culture NO GROWTH 1 DAY  Final   Report Status 11/15/2015 FINAL  Final  Culture, expectorated sputum-assessment     Status: None   Collection Time: 11/14/15  4:20 PM  Result Value Ref Range Status   Specimen Description SPU  Final   Special Requests NONE  Final   Sputum  evaluation THIS SPECIMEN IS ACCEPTABLE FOR SPUTUM CULTURE  Final   Report Status 11/14/2015 FINAL  Final  Culture, respiratory (NON-Expectorated)     Status: None (Preliminary result)   Collection Time: 11/14/15  4:20 PM  Result Value Ref Range Status   Specimen Description SPU  Final   Special Requests NONE Reflexed from [id number]  Final   Gram Stain   Final    MANY WBC SEEN NO SQUAMOUS EPITHELIAL CELLS PRESENT FEW GRAM POSITIVE COCCI    Culture HOLDING FOR POSSIBLE PATHOGEN  Final   Report Status PENDING  Incomplete  C difficile quick scan w PCR reflex     Status: Abnormal   Collection Time: 11/15/15  7:36 AM  Result Value Ref Range Status   C Diff antigen POSITIVE (A) NEGATIVE Final   C Diff toxin NEGATIVE NEGATIVE Final   C Diff interpretation   Final    Negative for toxigenic C. difficile. Toxin gene and active toxin production not detected. May be a nontoxigenic strain of C. difficile bacteria present, lacking the ability to produce toxin.  Clostridium Difficile by PCR     Status: None   Collection Time: 11/15/15  7:36 AM  Result Value Ref Range Status   Toxigenic C Difficile by pcr NEGATIVE NEGATIVE Final    Coagulation Studies: No results for input(s): LABPROT, INR in the last 72 hours.  Urinalysis: No results for input(s): COLORURINE, LABSPEC, PHURINE, GLUCOSEU, HGBUR, BILIRUBINUR, KETONESUR, PROTEINUR, UROBILINOGEN, NITRITE, LEUKOCYTESUR in the last 72 hours.  Invalid input(s): APPERANCEUR    Imaging: Dg Chest Port 1 View  11/15/2015  CLINICAL DATA:  Acute respiratory failure EXAM: PORTABLE CHEST 1 VIEW COMPARISON:  11/14/2015 FINDINGS: Endotracheal tube in good position. Central venous catheter tip near the cavoatrial junction unchanged. No pneumothorax. NG tube in the stomach Progression of diffuse bilateral airspace disease. Bilateral pleural effusions and bibasilar atelectasis. IMPRESSION: Endotracheal tube remains in good position Worsening bilateral airspace disease  most likely heart failure with edema. Electronically Signed   By: Franchot Gallo M.D.   On: 11/15/2015 06:40     Medications:   . feeding supplement (VITAL 1.5 CAL) 1,000 mL (11/16/15 0700)  . fentaNYL infusion INTRAVENOUS Stopped (11/16/15 0805)  . norepinephrine (LEVOPHED) Adult infusion 8 mcg/min (11/16/15 0805)   . acidophilus  2 capsule Oral TID  . amiodarone  400 mg Per NG tube Daily  . antiseptic oral rinse  7 mL Mouth Rinse 10 times per day  . aspirin  81 mg Per NG tube Daily  . atorvastatin  20 mg Per NG tube QPM  . bacitracin   Topical BID  . chlorhexidine gluconate  15 mL Mouth Rinse BID  . cholecalciferol  400 Units Per Tube Daily  . citalopram  20 mg Per NG tube Daily  . clopidogrel  75 mg Per NG tube Daily  . feeding supplement (PRO-STAT SUGAR FREE 64)  30 mL Per Tube TID  . free water  100 mL Per Tube 3 times per day  . hydrocortisone sod succinate (SOLU-CORTEF) inj  25 mg Intravenous Q12H  . ipratropium-albuterol  3 mL Nebulization Q6H  . lipase/protease/amylase  12,000 Units Oral TID WC  . metoCLOPramide (REGLAN) injection  5 mg Intravenous 4 times per day  . multivitamin  1 tablet Oral QHS  . pantoprazole (PROTONIX) IV  40 mg Intravenous Q24H  . piperacillin-tazobactam (ZOSYN)  IV  3.375 g Intravenous Q12H  . potassium phosphate IVPB (mmol)  20 mmol Intravenous Once  . vancomycin  125 mg Oral 4 times per day  . vitamin B-12  1,000 mcg Per NG tube Daily   acetaminophen **OR** acetaminophen, albuterol, alum & mag hydroxide-simeth, bisacodyl, dextrose, fentaNYL, iohexol, metoCLOPramide (REGLAN) injection, midazolam, midazolam, nitroGLYCERIN, promethazine, senna-docusate  Assessment/ Plan:  55 y.o. 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    [facility] MWF [place]  1. End Stage Renal Disease:  - Patient seen during dialysis Tolerating well   2. Anemia of chronic kidney disease:  - hemoglobin currently  9.3 -  Will restart EPO if Hgb trends down  3.  Acute resp failure - combination of Pneumonia, Anasarca and acute pulm edema -  Vent assisted  4.  Hyperkalemia  - corrected  Low K bath  5. Gastroparesis, Nausea and vomiting - Phenergan liquid      LOS:  Miklo Aken 2/24/20172:52 PM

## 2015-11-16 NOTE — Progress Notes (Signed)
Nutrition Follow-up     INTERVENTION:   EN: recommend continuing current TF regimen, continue to assess   NUTRITION DIAGNOSIS:   Inadequate oral intake related to altered GI function as evidenced by per patient/family report. Being addressed via TF  GOAL:   Patient will meet greater than or equal to 90% of their needs  MONITOR:    (Energy intake, Digestive system)  REASON FOR ASSESSMENT:   Consult Enteral/tube feeding initiation and management  ASSESSMENT:    Pt remains on vent, on and off D10 infusion for hypoglycemia, plan for HD today  Diet Order:  Diet NPO time specified  EN: tolerating Vital 1.5 TF at rate of 45 ml/hr, Prostat TID, 100 mL free water q 8 hours  Digestive System: no signs of TF intolerance, rectal tube in place with minimal stool, abdomen soft   Recent Labs Lab 11/13/15 1425 11/15/15 0549 11/16/15 0453  NA 133* 129* 127*  K 5.0 3.4* 3.6  CL 93* 92* 89*  CO2 32 27 27  BUN 22* 33* 44*  CREATININE 4.13* 4.02* 4.54*  CALCIUM 8.4* 7.6* 7.8*  MG  --  1.6* 1.9  PHOS  --  2.2* 2.0*  GLUCOSE 78 146* 115*    Glucose Profile:  Recent Labs  11/16/15 0804 11/16/15 0956 11/16/15 1207  GLUCAP 156* 205* 218*   Meds: acidophilus, reglan, MVI, levophed, creon  Height:   Ht Readings from Last 1 Encounters:  11/12/15 5\' 5"  (1.651 m)    Weight:   Wt Readings from Last 1 Encounters:  11/16/15 167 lb 12.3 oz (76.1 kg)   Filed Weights   11/12/15 2115 11/12/15 2208 11/16/15 0517  Weight: 167 lb 8.8 oz (76 kg) 161 lb 9.6 oz (73.301 kg) 167 lb 12.3 oz (76.1 kg)    BMI:  Body mass index is 27.92 kg/(m^2).  Estimated Nutritional Needs:   Kcal:  1887 kcals (Ve: 9.1, Tmax: 39.2) using wt of 73 kg  Protein:  110-146 g(1.5-2.0 g/kg)   Fluid:  (1028ml + UOP)  EDUCATION NEEDS:   No education needs identified at this time  HIGH Care Level  Kerman Passey MS, RD, LDN [phone] Pager  [phone] Weekend/On-Call Pager

## 2015-11-16 NOTE — Care Management (Signed)
HD today.  Weaning sedation.  Remains on Levophed.  Different care team members have had different assessment when it comes to level of  responsiveness.

## 2015-11-16 NOTE — Progress Notes (Signed)
[place] [facility] [place] NAME: [name]    MR#:  [id number]  DATE OF BIRTH:  12/29/1960  Patient continues to be on vent, critically ill requiring full vent support, on pressors on Levophed. No diarrhea. Minimal response with weaning sedation. Noted to have right arm edema.      CHIEF COMPLAINT:   Chief Complaint  Patient presents with  . Nausea  . Emesis    REVIEW OF SYSTEMS:    Review of Systems  Unable to perform ROS: critical illness  Constitutional: Negative for fever and chills.  HENT: Negative for hearing loss.   Eyes: Negative for blurred vision, double vision and photophobia.  Respiratory: Negative for cough, hemoptysis and shortness of breath.   Cardiovascular: Negative for palpitations, orthopnea and leg swelling.  Gastrointestinal: Negative for nausea, vomiting, abdominal pain and diarrhea.  Genitourinary: Negative for dysuria and urgency.  Musculoskeletal: Negative for myalgias and neck pain.  Skin: Negative for rash.  Neurological: Negative for dizziness, focal weakness, seizures, weakness and headaches.        Lethargic.  Psychiatric/Behavioral: Negative for memory loss. The patient does not have insomnia.     Nutrition:  Tolerating Diet: Tolerating PT:      DRUG ALLERGIES:   Allergies  Allergen Reactions  . Compazine [Prochlorperazine Edisylate] Anaphylaxis and Nausea And Vomiting  . Ace Inhibitors Swelling  . Ativan [Lorazepam] Other (See Comments)    Reaction:  Hallucinations and headaches  . Codeine Nausea And Vomiting  . Gabapentin Other (See Comments)    Reaction:  Unknown   . Losartan Other (See Comments)    Reaction:  Unknown   . Ondansetron Other (See Comments)    Reaction:  Unknown   . Prochlorperazine Other (See Comments)    Reaction:  Unknown   . Scopolamine Other (See Comments)    Reaction:  Unknown   . Zofran [Ondansetron Hcl] Other (See Comments)    Reaction:  hallucinations    . Oxycodone Anxiety  . Tape Rash    VITALS:  Blood pressure 112/48, pulse 77, temperature 98.9 F (37.2 C), temperature source Oral, resp. rate 25, height 5\' 5"  (1.651 m), weight 76.1 kg (167 lb 12.3 oz), SpO2 99 %.  PHYSICAL EXAMINATION:   Physical Exam  GENERAL:  55 y.o.-year-old patient lying in the bed  intubated, currently  EYES: Pupils equal, round, reactive to light and accommodation. No scleral icterus. Extraocular muscles intact.  HEENT: Head atraumatic, normocephalic. Oropharynx and nasopharynx clear.  NECK:  Supple, no jugular venous distention. No thyroid enlargement, no tenderness.  LUNGS: , Mostly clear to auscultation.not using accessory muscles of respirations. CARDIOVASCULAR: S1, S2 normal. No murmurs, rubs, or gallops.  ABDOMEN: Soft, nontender, nondistended. Bowel sounds present. No organomegaly or mass.  EXTREMITIES: Right hand edema. NEUROLOGIC:  Currently intubated /sedated. Marland KitchenPSYCHIATRIC:lntubated  and sedated.  SKIN: No obvious rash, lesion, or ulcer.    LABORATORY PANEL:   CBC  Recent Labs Lab 11/16/15 0453  WBC 9.8  HGB 9.3*  HCT 28.6*  PLT 81*   ------------------------------------------------------------------------------------------------------------------  Chemistries   Recent Labs Lab 11/13/15 1425  11/16/15 0453  NA 133*  < > 127*  K 5.0  < > 3.6  CL 93*  < > 89*  CO2 32  < > 27  GLUCOSE 78  < > 115*  BUN 22*  < > 44*  CREATININE 4.13*  < > 4.54*  CALCIUM 8.4*  < > 7.8*  MG  --   < >  1.9  AST 20  --   --   ALT 11*  --   --   ALKPHOS 144*  --   --   BILITOT 1.1  --   --   < > = values in this interval not displayed. ------------------------------------------------------------------------------------------------------------------  Cardiac Enzymes  Recent Labs Lab 11/12/15 0622  TROPONINI <0.03    ------------------------------------------------------------------------------------------------------------------  RADIOLOGY:  Dg Abd 1 View  11/14/2015  CLINICAL DATA:  OG tube placement. EXAM: ABDOMEN - 1 VIEW COMPARISON:  None. FINDINGS: OG tube is in place in good position with the side-port in the stomach. IMPRESSION: As above. Electronically Signed   By: Inge Rise M.D.   On: 11/14/2015 13:23   Dg Chest Port 1 View  11/15/2015  CLINICAL DATA:  Acute respiratory failure EXAM: PORTABLE CHEST 1 VIEW COMPARISON:  11/14/2015 FINDINGS: Endotracheal tube in good position. Central venous catheter tip near the cavoatrial junction unchanged. No pneumothorax. NG tube in the stomach Progression of diffuse bilateral airspace disease. Bilateral pleural effusions and bibasilar atelectasis. IMPRESSION: Endotracheal tube remains in good position Worsening bilateral airspace disease most likely heart failure with edema. Electronically Signed   By: Franchot Gallo M.D.   On: 11/15/2015 06:40   Dg Chest Port 1 View  11/14/2015  CLINICAL DATA:  Status post central line placement EXAM: PORTABLE CHEST 1 VIEW COMPARISON:  11/14/2015 FINDINGS: Cardiac shadow is mildly enlarged. Bilateral pleural effusions and likely bibasilar infiltrate is again identified. Changes consistent with prior stenting in both upper extremities is seen. Additionally the left-sided stenting extends centrally into the subclavian vessel. A new left subclavian central venous line is noted with catheter tip in the superior right atrium. No pneumothorax is noted. Endotracheal tube and nasogastric catheter are noted in satisfactory position. The endotracheal tube is approximately 5 cm above the carina. No bony abnormality is seen. IMPRESSION: Status post left central venous line without pneumothorax. Interval placement of endotracheal tube and nasogastric catheter. Bibasilar changes stable from the prior exam. Electronically Signed   By: Inez Catalina M.D.   On: 11/14/2015 13:27     ASSESSMENT AND PLAN:   Active Problems:   Nausea   High temperature   Pulmonary edema   Encounter for central line placement   Encounter for orogastric (OG) tube placement   1. Acute respiratory failure secondary to pneumonia, pulmonary edema: Continue full vent support, dialysis as per nephrology. Continue by mouth vancomycin, Zosyn.  #2. Septic shock: Secondary to C. difficile, pneumonia: Continue pressors, antibiotics, continue vancomycin and Zosyn.  #2 hyperkalemia ; improved with hemodialysis.  #3 gastroparesis secondary to diabetes mellitus type 2: Continue Phenergan, Reglan.  #4. Diabetes mellitus type 2 with hypoglycemia:  hypoglycemia improved. Hypoglycemia due to sepsis. D10 water as per the recommendations from endocrinology, continue IV steroids.   persistently hypoglycemic secondary to sepsis: Continue D10 water at 75 mg normal. Appreciate endocrinology follow-up  #5 acute on chronic diastolic heart failure in setting of fall ESRD: Continue dialysis. Coronary artery disease patient is on Plavix, Coreg, amiodarone continue them. Has hyponatremia due to CHF: patient needs dialysis today.  #7 chronic pancreatitis: Continue Creon  #8 right arm edema, exclude DVT: Check ultrasound. #9 thrombocytopenia: Stop the heparin, check HIT panel 10. Check recurrent C. difficile, stool transplant: Continue isolation because of her positive C. difficile antigen  positive with chronic carrier state. And prophylactic Vancomycin was started. High risk for cardiac arrest  All the records are reviewed and case discussed with Care Management/Social Workerr. Management  plans discussed with the patient, family and they are in agreement.  CODE STATUS:full  TOTAL TIME TAKING CARE OF THIS PATIENT:  36 minutes of critical care  condition critical ;high risk for cardiac arrest    Jahkai Yandell M.D on 11/16/2015 at 12:07 PM  Between 7am to  6pm - Pager - [phone]  After 6pm go to [email or web address] - password [email or web address] [facility]  Office  [phone]  CC: Primary care physician; Ellamae Sia, MD

## 2015-11-16 NOTE — Progress Notes (Signed)
Endocrinology follow up  Provider Requesting Consult: Bettey Costa, MD  History of present illness: [name] is a 55 y.o. female with ESRD on hemodialysis, HTN, CAD, CHF, and diabetes who was admitted on 2/20 with nausea and vomiting. Admitting labs notable for blood glucose of 62 and hyperkalemia. She was admitted and dialyzed emergently.  On the following hospital day she had severe hypoglycemia with BG in the 30s. She was given dextrose boluses and then initiated on a dextrose continuous drip. Yesterday morning sugars improved and dextrose was held however late evening BG decreased to 60s and dextrose was restarted. This morning sugars increased into the 200s and dextrose again was hold. She also has been on stress dose steroids, with dose adjusted yesterday to 25 mg bid. She is intubated and sedated and could not provide a history. Nutrition is via tube feeds.    Medical history Past Medical History  Diagnosis Date  . Asthma   . Collagen vascular disease ([place])   . Diabetes mellitus without complication ([place])   . Hypertension   . Coronary artery disease     a. cath 2013: stenting to RCA (report not available); b. cath 2014: LM nl, pLAD 40%, mLAD nl, ost LCx 40%, mid LCx nl, pRCA 30% @ site of prior stent, mRCA 50%  . Diabetic neuropathy ([place])   . ESRD (end stage renal disease) on dialysis ([place])     M-W-F  . GERD (gastroesophageal reflux disease)   . Hx of pancreatitis 2015  . Myocardial infarction ([place])   . Pneumonia   . Mitral regurgitation     a. echo 10/2013: EF 62%, noWMA, mildly dilated LA, mild to mod MR/TR, GR1DD  . COPD (chronic obstructive pulmonary disease) ([place])   . chronic diastolic CHF [id number]  . dialysis 2006  . Depression   . Anxiety   . Arthritis   . Headache   . Anemia   . Renal insufficiency      Surgical history Past Surgical History  Procedure Laterality Date  . Cholecystectomy    . Appendectomy    . Abdominal hysterectomy    . Dialysis fistula  creation Left     upper arm  . Esophagogastroduodenoscopy N/A 03/08/2015    Procedure: ESOPHAGOGASTRODUODENOSCOPY (EGD);  Surgeon: Manya Silvas, MD;  Location[facility] ENDOSCOPY;  Service: Endoscopy;  Laterality: N/A;  . Cardiac catheterization Left 07/26/2015    Procedure: Left Heart Cath and Coronary Angiography;  Surgeon: Dionisio David, MD;  Location: [facility] CV LAB;  Service: Cardiovascular;  Laterality: Left;  Marland Kitchen Eye surgery    . Fecal transplant N/A 08/23/2015    Procedure: FECAL TRANSPLANT;  Surgeon: Manya Silvas, MD;  Location: [facility] ENDOSCOPY;  Service: Endoscopy;  Laterality: N/A;     Medications . acidophilus  2 capsule Oral TID  . amiodarone  400 mg Per NG tube Daily  . antiseptic oral rinse  7 mL Mouth Rinse 10 times per day  . aspirin  81 mg Per NG tube Daily  . atorvastatin  20 mg Per NG tube QPM  . bacitracin   Topical BID  . chlorhexidine gluconate  15 mL Mouth Rinse BID  . cholecalciferol  400 Units Per Tube Daily  . citalopram  20 mg Per NG tube Daily  . clopidogrel  75 mg Per NG tube Daily  . feeding supplement (PRO-STAT SUGAR FREE 64)  30 mL Per Tube TID  . free water  100 mL Per Tube 3 times per day  .  hydrocortisone sod succinate (SOLU-CORTEF) inj  25 mg Intravenous Q12H  . ipratropium-albuterol  3 mL Nebulization Q6H  . lipase/protease/amylase  12,000 Units Oral TID WC  . metoCLOPramide (REGLAN) injection  5 mg Intravenous 4 times per day  . multivitamin  1 tablet Oral QHS  . pantoprazole (PROTONIX) IV  40 mg Intravenous Q24H  . piperacillin-tazobactam (ZOSYN)  IV  3.375 g Intravenous Q12H  . potassium phosphate IVPB (mmol)  20 mmol Intravenous Once  . vancomycin  125 mg Oral 4 times per day  . vitamin B-12  1,000 mcg Per NG tube Daily     Social history Social History  Substance Use Topics  . Smoking status: Current Some Day Smoker -- 0.50 packs/day    Types: Cigarettes    Last Attempt to Quit: 02/13/2015  . Smokeless tobacco: Never Used  .  Alcohol Use: No     Family history Family History  Problem Relation Age of Onset  . Kidney disease Mother   . Diabetes Mother      Review of systems CV: no chest pain or palpitations PULM: no cough or shortness of breath ABD: no abdominal pain or nausea or vomiting   Physical Exam BP 117/46 mmHg  Pulse 77  Temp(Src) 98.9 F (37.2 C) (Axillary)  Resp 22  Ht 5\' 5"  (1.651 m)  Wt 76.1 kg (167 lb 12.3 oz)  BMI 27.92 kg/m2  SpO2 99%  GEN: WD AAF. Intubated, sedated.  HEENT: OT in place. [place]/AT.  NECK: supple, trachea midline.   RESPIRATORY: clear bilaterally, no wheeze, good inspiratory effort. CHEST: left sided subclavian central line in place, site C/D. CV: No carotid bruits, RRR. MUSCULOSKELETAL: rt arm pitting edema, reduced motor tone. Left arm dialysis catheter in place.  ABD: soft, non-distended, no palpable mass SKIN: no dermatopathy or rash appreciated LYMPH: no submandibular or supraclavicular LAD NEURO: PERRL, sedated. PSYC: unable to assess.  Labs Results for orders placed or performed during the hospital encounter of 11/12/15 (from the past 24 hour(s))  Glucose, capillary     Status: None   Collection Time: 11/15/15  5:03 PM  Result Value Ref Range   Glucose-Capillary 96 65 - 99 mg/dL  Glucose, capillary     Status: Abnormal   Collection Time: 11/15/15  8:13 PM  Result Value Ref Range   Glucose-Capillary 63 (L) 65 - 99 mg/dL  Glucose, capillary     Status: None   Collection Time: 11/15/15 11:57 PM  Result Value Ref Range   Glucose-Capillary 73 65 - 99 mg/dL  Glucose, capillary     Status: None   Collection Time: 11/16/15  4:08 AM  Result Value Ref Range   Glucose-Capillary 99 65 - 99 mg/dL  CBC     Status: Abnormal   Collection Time: 11/16/15  4:53 AM  Result Value Ref Range   WBC 9.8 3.6 - 11.0 K/uL   RBC 3.42 (L) 3.80 - 5.20 MIL/uL   Hemoglobin 9.3 (L) 12.0 - 16.0 g/dL   HCT 28.6 (L) 35.0 - 47.0 %   MCV 83.6 80.0 - 100.0 fL   MCH 27.2 26.0 -  34.0 pg   MCHC 32.5 32.0 - 36.0 g/dL   RDW 19.6 (H) 11.5 - 14.5 %   Platelets 81 (L) 150 - 440 K/uL  Basic metabolic panel     Status: Abnormal   Collection Time: 11/16/15  4:53 AM  Result Value Ref Range   Sodium 127 (L) 135 - 145 mmol/L   Potassium 3.6  3.5 - 5.1 mmol/L   Chloride 89 (L) 101 - 111 mmol/L   CO2 27 22 - 32 mmol/L   Glucose, Bld 115 (H) 65 - 99 mg/dL   BUN 44 (H) 6 - 20 mg/dL   Creatinine, Ser 4.54 (H) 0.44 - 1.00 mg/dL   Calcium 7.8 (L) 8.9 - 10.3 mg/dL   GFR calc non Af Amer 10 (L) >60 mL/min   GFR calc Af Amer 11 (L) >60 mL/min   Anion gap 11 5 - 15  Magnesium     Status: None   Collection Time: 11/16/15  4:53 AM  Result Value Ref Range   Magnesium 1.9 1.7 - 2.4 mg/dL  Phosphorus     Status: Abnormal   Collection Time: 11/16/15  4:53 AM  Result Value Ref Range   Phosphorus 2.0 (L) 2.5 - 4.6 mg/dL  Glucose, capillary     Status: Abnormal   Collection Time: 11/16/15  8:04 AM  Result Value Ref Range   Glucose-Capillary 156 (H) 65 - 99 mg/dL  Glucose, capillary     Status: Abnormal   Collection Time: 11/16/15  9:56 AM  Result Value Ref Range   Glucose-Capillary 205 (H) 65 - 99 mg/dL  Glucose, capillary     Status: Abnormal   Collection Time: 11/16/15 12:07 PM  Result Value Ref Range   Glucose-Capillary 218 (H) 65 - 99 mg/dL     Assessment Hypoglycemia - secondary to septic shock, resolved with stress dose steroids Diabetes mellitus, type 2 ESRD on HD  Plan -Agree with stopping dextrose infusion. -Currently hyperglycemia, which may be due to K-phos given in D10 (500 cc) and/or stress dose steroids.  -Recommend that if hyperglycemia continues tomorrow despite completion of the K-phos, then stop the hydrocortisone. If despite stopping steroids she continues to have hyperglycemia, then reassess tube feeds and reduce glucose intake if possible. -No role for starting insulin until the above factors are modified. -Patient is critically ill and at risk for  cardiac arrest and death.   Endocrinology will not be available over the upcoming weekend. Service will resume following the patient on Monday 2/27.  Time spent on care of patient was 60 min.

## 2015-11-16 NOTE — Progress Notes (Signed)
Pharmacy Antibiotic Note Day 1  [name] is a 55 y.o. female admitted on 11/12/2015 with sepsis.  Pharmacy has been consulted for Zosyn and vancomycin dosing.Patient receiving hydrocortisone 25mg  IV Q12hr. Patient is negative for clostridium difficile, but has significant history of Cdiff requiring fecal transplant. Patient ordered vancomycin 125mg  VT Q6hr while on broad spectrum antibiotics.   Plan: Will continue Zosyn EI 3.375g IV Q12hr.  After discussion with Dr. Ashby Dawes, MRSA PCR is negative so will d/c vancomycin.   Height: 5\' 5"  (165.1 cm) Weight: 167 lb 12.3 oz (76.1 kg) IBW/kg (Calculated) : 57  Temp (24hrs), Avg:98.6 F (37 C), Min:97.9 F (36.6 C), Max:98.9 F (37.2 C)   Recent Labs Lab 11/12/15 0556 11/12/15 0622 11/13/15 0744 11/13/15 1425 11/14/15 0053 11/15/15 0549 11/16/15 0453  WBC 8.7  --   --   --  3.0* 3.9 9.8  CREATININE  --  5.19* 3.78* 4.13*  --  4.02* 4.54*    Estimated Creatinine Clearance: 14.1 mL/min (by C-G formula based on Cr of 4.54).    Allergies  Allergen Reactions  . Compazine [Prochlorperazine Edisylate] Anaphylaxis and Nausea And Vomiting  . Ace Inhibitors Swelling  . Ativan [Lorazepam] Other (See Comments)    Reaction:  Hallucinations and headaches  . Codeine Nausea And Vomiting  . Gabapentin Other (See Comments)    Reaction:  Unknown   . Losartan Other (See Comments)    Reaction:  Unknown   . Ondansetron Other (See Comments)    Reaction:  Unknown   . Prochlorperazine Other (See Comments)    Reaction:  Unknown   . Scopolamine Other (See Comments)    Reaction:  Unknown   . Zofran [Ondansetron Hcl] Other (See Comments)    Reaction:  hallucinations   . Oxycodone Anxiety  . Tape Rash    Antimicrobials this admission: Zosyn 2/22 >>  Vancomycin IV 2/22>> 02/24 Vancomycin PO 2/23>>   Microbiology results: 2/22 BCx x 2: no growth x 1 day  2/22 Sputum Cx: pending   2/22 UCx: no growth  2/22 MRSA PCR: negative 2/23  Cdiff: negative  Pharmacy will continue to monitor and adjust per consult.    Napoleon Form 11/16/2015 3:35 PM

## 2015-11-17 ENCOUNTER — Inpatient Hospital Stay: Payer: Medicare Other

## 2015-11-17 DIAGNOSIS — R6521 Severe sepsis with septic shock: Secondary | ICD-10-CM

## 2015-11-17 DIAGNOSIS — N186 End stage renal disease: Secondary | ICD-10-CM

## 2015-11-17 DIAGNOSIS — A419 Sepsis, unspecified organism: Secondary | ICD-10-CM

## 2015-11-17 DIAGNOSIS — J9621 Acute and chronic respiratory failure with hypoxia: Secondary | ICD-10-CM

## 2015-11-17 DIAGNOSIS — J690 Pneumonitis due to inhalation of food and vomit: Secondary | ICD-10-CM

## 2015-11-17 DIAGNOSIS — Z992 Dependence on renal dialysis: Secondary | ICD-10-CM

## 2015-11-17 LAB — CBC
HCT: 28.4 % — ABNORMAL LOW (ref 35.0–47.0)
Hemoglobin: 9.2 g/dL — ABNORMAL LOW (ref 12.0–16.0)
MCH: 26.7 pg (ref 26.0–34.0)
MCHC: 32.2 g/dL (ref 32.0–36.0)
MCV: 82.8 fL (ref 80.0–100.0)
Platelets: 65 10*3/uL — ABNORMAL LOW (ref 150–440)
RBC: 3.43 MIL/uL — ABNORMAL LOW (ref 3.80–5.20)
RDW: 19.5 % — ABNORMAL HIGH (ref 11.5–14.5)
WBC: 15.2 10*3/uL — ABNORMAL HIGH (ref 3.6–11.0)

## 2015-11-17 LAB — PHOSPHORUS
Phosphorus: <1 mg/dL — CL (ref 2.5–4.6)
Phosphorus: 2 mg/dL — ABNORMAL LOW (ref 2.5–4.6)

## 2015-11-17 LAB — GLUCOSE, CAPILLARY
Glucose-Capillary: 180 mg/dL — ABNORMAL HIGH (ref 65–99)
Glucose-Capillary: 200 mg/dL — ABNORMAL HIGH (ref 65–99)
Glucose-Capillary: 207 mg/dL — ABNORMAL HIGH (ref 65–99)
Glucose-Capillary: 258 mg/dL — ABNORMAL HIGH (ref 65–99)
Glucose-Capillary: 259 mg/dL — ABNORMAL HIGH (ref 65–99)
Glucose-Capillary: 336 mg/dL — ABNORMAL HIGH (ref 65–99)

## 2015-11-17 LAB — BASIC METABOLIC PANEL
Anion gap: 9 (ref 5–15)
BUN: 32 mg/dL — ABNORMAL HIGH (ref 6–20)
CO2: 30 mmol/L (ref 22–32)
Calcium: 8 mg/dL — ABNORMAL LOW (ref 8.9–10.3)
Chloride: 94 mmol/L — ABNORMAL LOW (ref 101–111)
Creatinine, Ser: 2.81 mg/dL — ABNORMAL HIGH (ref 0.44–1.00)
GFR calc Af Amer: 21 mL/min — ABNORMAL LOW (ref >60)
GFR calc non Af Amer: 18 mL/min — ABNORMAL LOW (ref >60)
Glucose, Bld: 231 mg/dL — ABNORMAL HIGH (ref 65–99)
Potassium: 2.6 mmol/L — CL (ref 3.5–5.1)
Sodium: 133 mmol/L — ABNORMAL LOW (ref 135–145)

## 2015-11-17 LAB — BLOOD GAS, ARTERIAL
Acid-Base Excess: 6.8 mmol/L — ABNORMAL HIGH (ref 0.0–3.0)
Allens test (pass/fail): POSITIVE — AB
Bicarbonate: 31.3 mEq/L — ABNORMAL HIGH (ref 21.0–28.0)
FIO2: 0.4
MECHVT: 450 mL
Mechanical Rate: 18
O2 Saturation: 96.1 %
PEEP: 5 cmH2O
Patient temperature: 37
RATE: 18 resp/min
pCO2 arterial: 43 mmHg (ref 32.0–48.0)
pH, Arterial: 7.47 — ABNORMAL HIGH (ref 7.350–7.450)
pO2, Arterial: 77 mmHg — ABNORMAL LOW (ref 83.0–108.0)

## 2015-11-17 LAB — MAGNESIUM: Magnesium: 1.8 mg/dL (ref 1.7–2.4)

## 2015-11-17 LAB — HEPARIN INDUCED PLATELET AB (HIT ANTIBODY): Heparin Induced Plt Ab: 0.236 OD (ref 0.000–0.400)

## 2015-11-17 LAB — POTASSIUM: Potassium: 3.4 mmol/L — ABNORMAL LOW (ref 3.5–5.1)

## 2015-11-17 MED ORDER — POTASSIUM PHOSPHATES 15 MMOLE/5ML IV SOLN
30.0000 mmol | Freq: Once | INTRAVENOUS | Status: AC
  Administered 2015-11-17: 30 mmol via INTRAVENOUS
  Filled 2015-11-17: qty 10

## 2015-11-17 MED ORDER — INSULIN ASPART 100 UNIT/ML ~~LOC~~ SOLN
0.0000 [IU] | SUBCUTANEOUS | Status: DC
  Administered 2015-11-17: 4 [IU] via SUBCUTANEOUS
  Administered 2015-11-17: 7 [IU] via SUBCUTANEOUS
  Administered 2015-11-17: 11 [IU] via SUBCUTANEOUS
  Administered 2015-11-18 (×2): 3 [IU] via SUBCUTANEOUS
  Filled 2015-11-17: qty 4
  Filled 2015-11-17: qty 3
  Filled 2015-11-17: qty 1
  Filled 2015-11-17: qty 3

## 2015-11-17 MED ORDER — PANTOPRAZOLE SODIUM 40 MG PO PACK
40.0000 mg | PACK | Freq: Every day | ORAL | Status: DC
  Administered 2015-11-17: 40 mg
  Filled 2015-11-17: qty 20

## 2015-11-17 MED ORDER — SODIUM CHLORIDE 0.9 % IV SOLN
1.5000 g | Freq: Two times a day (BID) | INTRAVENOUS | Status: DC
  Administered 2015-11-17 (×2): 1.5 g via INTRAVENOUS
  Filled 2015-11-17 (×4): qty 1.5

## 2015-11-17 MED ORDER — INSULIN ASPART 100 UNIT/ML ~~LOC~~ SOLN
0.0000 [IU] | SUBCUTANEOUS | Status: DC
  Administered 2015-11-17: 7 [IU] via SUBCUTANEOUS
  Administered 2015-11-17: 5 [IU] via SUBCUTANEOUS
  Administered 2015-11-17: 3 [IU] via SUBCUTANEOUS
  Filled 2015-11-17: qty 5
  Filled 2015-11-17: qty 3
  Filled 2015-11-17: qty 7

## 2015-11-17 MED ORDER — INSULIN ASPART 100 UNIT/ML ~~LOC~~ SOLN: 0.0000 [IU] | SUBCUTANEOUS | Status: DC

## 2015-11-17 NOTE — Progress Notes (Signed)
[place] Progress Note Patient Name: [name] DOB: 1960/12/21 MRN: [id number]   Date of Service  11/17/2015  HPI/Events of Note  Hypokalemia and hypophosphatemia in the setting of ESRD.  Received HD yesterday.  eICU Interventions  Replaced potassium and phos     Intervention Category Intermediate Interventions: Electrolyte abnormality - evaluation and management  DETERDING,ELIZABETH 11/17/2015, 5:51 AM

## 2015-11-17 NOTE — Progress Notes (Signed)
Critical values called by Lab for low potassium and low phosphorus.  Reginal Lutes MD to report same.  MD had already addressed issue with new orders.

## 2015-11-17 NOTE — Progress Notes (Signed)
Nutrition Follow-up     INTERVENTION:  EN: Continue vital 1.5 at 53ml/hr with prostat TID to meet nutritional needs, once tube placement confirmed   NUTRITION DIAGNOSIS:   Inadequate oral intake related to altered GI function as evidenced by per patient/family report.    GOAL:   Patient will meet greater than or equal to 90% of their needs    MONITOR:    (Energy intake, Digestive system)  REASON FOR ASSESSMENT:   Consult Enteral/tube feeding initiation and management  ASSESSMENT:      OG tube replaced this am as tip of tube was noted to be in mouth this am per RN, Adam.  Abdominal xray took this am following placement.     Current Nutrition: Pt previously tolerating tube feeding at goal rate   Gastrointestinal Profile: Last BM: multiple loose stools per RN   Scheduled Medications:  . acidophilus  2 capsule Oral TID  . amiodarone  400 mg Per NG tube Daily  . ampicillin-sulbactam (UNASYN) IV  1.5 g Intravenous Q12H  . antiseptic oral rinse  7 mL Mouth Rinse 10 times per day  . aspirin  81 mg Per NG tube Daily  . atorvastatin  20 mg Per NG tube QPM  . bacitracin   Topical BID  . chlorhexidine gluconate  15 mL Mouth Rinse BID  . cholecalciferol  400 Units Per Tube Daily  . citalopram  20 mg Per NG tube Daily  . clopidogrel  75 mg Per NG tube Daily  . feeding supplement (PRO-STAT SUGAR FREE 64)  30 mL Per Tube TID  . free water  100 mL Per Tube 3 times per day  . hydrocortisone sod succinate (SOLU-CORTEF) inj  25 mg Intravenous Q12H  . insulin aspart  0-9 Units Subcutaneous 6 times per day  . ipratropium-albuterol  3 mL Nebulization Q6H  . lipase/protease/amylase  12,000 Units Oral TID WC  . pantoprazole sodium  40 mg Per Tube Q1200  . potassium phosphate IVPB (mmol)  30 mmol Intravenous Once  . vancomycin  125 mg Oral 4 times per day  . vitamin B-12  1,000 mcg Per NG tube Daily    Continuous Medications:  . feeding supplement (VITAL 1.5 CAL) 1,000 mL  (11/16/15 1843)  . fentaNYL infusion INTRAVENOUS Stopped (11/16/15 0805)  . norepinephrine (LEVOPHED) Adult infusion 6 mcg/min (11/17/15 1245)     Electrolyte/Renal Profile and Glucose Profile:   Recent Labs Lab 11/15/15 0549 11/16/15 0453 11/17/15 0507  NA 129* 127* 133*  K 3.4* 3.6 2.6*  CL 92* 89* 94*  CO2 27 27 30   BUN 33* 44* 32*  CREATININE 4.02* 4.54* 2.81*  CALCIUM 7.6* 7.8* 8.0*  MG 1.6* 1.9 1.8  PHOS 2.2* 2.0* <1.0*  GLUCOSE 146* 115* 231*   Protein Profile:  Recent Labs Lab 11/12/15 0622 11/13/15 0744 11/13/15 1425  ALBUMIN 3.2* 3.0* 3.1*      Weight Trend since Admission: Filed Weights   11/12/15 2208 11/16/15 0517 11/17/15 0521  Weight: 161 lb 9.6 oz (73.301 kg) 167 lb 12.3 oz (76.1 kg) 160 lb 11.5 oz (72.9 kg)      Diet Order:  Diet NPO time specified  Skin:   reviewed   Height:   Ht Readings from Last 1 Encounters:  11/12/15 5\' 5"  (1.651 m)    Weight:   Wt Readings from Last 1 Encounters:  11/17/15 160 lb 11.5 oz (72.9 kg)    Ideal Body Weight:     BMI:  Body mass  index is 26.74 kg/(m^2).  Estimated Nutritional Needs:   Kcal:  1887 kcals (Ve: 9.1, Tmax: 39.2) using wt of 73 kg  Protein:  110-146 g(1.5-2.0 g/kg)   Fluid:  (105ml + UOP)  EDUCATION NEEDS:   No education needs identified at this time  HIGH Care Level  Miki Blank B. Zenia Resides, [place], [place] (pager) Weekend/On-Call pager [phone])

## 2015-11-17 NOTE — Progress Notes (Signed)
Subjective:  Intubated 2/22 Remains critically ill Opening eyes Not following commands  Low K, Low Phos being replaced    Objective:  Vital signs in last 24 hours:  Temp:  [97 F (36.1 C)-98.9 F (37.2 C)] 97.1 F (36.2 C) (02/24 2000) Pulse Rate:  [76-84] 78 (02/25 0600) Resp:  [21-27] 24 (02/25 0600) BP: (103-129)/(40-55) 122/47 mmHg (02/25 0600) SpO2:  [98 %-100 %] 100 % (02/25 0600) FiO2 (%):  [28 %-40 %] 28 % (02/25 0801) Weight:  [72.9 kg (160 lb 11.5 oz)] 72.9 kg (160 lb 11.5 oz) (02/25 0521)  Weight change: -3.2 kg (-7 lb 0.9 oz) Filed Weights   11/12/15 2208 11/16/15 0517 11/17/15 0521  Weight: 73.301 kg (161 lb 9.6 oz) 76.1 kg (167 lb 12.3 oz) 72.9 kg (160 lb 11.5 oz)    Intake/Output:    Intake/Output Summary (Last 24 hours) at 11/17/15 1004 Last data filed at 11/16/15 2000  Gross per 24 hour  Intake 976.67 ml  Output   1000 ml  Net -23.33 ml     Physical Exam: General: Critically ill appearing  HEENT Anicteric, dry oral mucus membranes  Neck supple  Pulm/lungs Vent assisted  CVS/Heart No rub  Abdomen:  Soft, Non tender  Extremities: + dependent edema  Neurologic: Sedated, opens eyes  Skin: No acute rashes  Access: AVF       Basic Metabolic Panel:   Recent Labs Lab 11/13/15 0744 11/13/15 1425 11/15/15 0549 11/16/15 0453 11/17/15 0507  NA 134* 133* 129* 127* 133*  K 5.4* 5.0 3.4* 3.6 2.6*  CL 96* 93* 92* 89* 94*  CO2 29 32 27 27 30   GLUCOSE 84 78 146* 115* 231*  BUN 20 22* 33* 44* 32*  CREATININE 3.78* 4.13* 4.02* 4.54* 2.81*  CALCIUM 8.5* 8.4* 7.6* 7.8* 8.0*  MG  --   --  1.6* 1.9 1.8  PHOS  --   --  2.2* 2.0* <1.0*     CBC:  Recent Labs Lab 11/12/15 0556 11/14/15 0053 11/15/15 0549 11/16/15 0453 11/17/15 0507  WBC 8.7 3.0* 3.9 9.8 15.2*  HGB 12.2 11.0* 10.1* 9.3* 9.2*  HCT 38.1 34.8* 31.7* 28.6* 28.4*  MCV 87.3 88.5 85.8 83.6 82.8  PLT 160 92* 108* 81* 65*      Microbiology:  Recent Results (from the past  720 hour(s))  MRSA PCR Screening     Status: None   Collection Time: 11/13/15  9:46 PM  Result Value Ref Range Status   MRSA by PCR NEGATIVE NEGATIVE Final    Comment:        The GeneXpert MRSA Assay (FDA approved for NASAL specimens only), is one component of a comprehensive MRSA colonization surveillance program. It is not intended to diagnose MRSA infection nor to guide or monitor treatment for MRSA infections.   Culture, blood (Routine X 2) w Reflex to ID Panel     Status: None (Preliminary result)   Collection Time: 11/14/15 12:53 AM  Result Value Ref Range Status   Specimen Description BLOOD RIGHT HAND  Final   Special Requests BOTTLES DRAWN AEROBIC AND ANAEROBIC 5ML  Final   Culture NO GROWTH 2 DAYS  Final   Report Status PENDING  Incomplete  Culture, blood (Routine X 2) w Reflex to ID Panel     Status: None (Preliminary result)   Collection Time: 11/14/15  7:14 AM  Result Value Ref Range Status   Specimen Description BLOOD RIGHT HAND  Final   Special Requests  Final    BOTTLES DRAWN AEROBIC AND ANAEROBIC AER 4ML ANA 2ML   Culture NO GROWTH 2 DAYS  Final   Report Status PENDING  Incomplete  Urine culture     Status: None   Collection Time: 11/14/15 10:16 AM  Result Value Ref Range Status   Specimen Description URINE, RANDOM  Final   Special Requests NONE  Final   Culture NO GROWTH 1 DAY  Final   Report Status 11/15/2015 FINAL  Final  Culture, expectorated sputum-assessment     Status: None   Collection Time: 11/14/15  4:20 PM  Result Value Ref Range Status   Specimen Description SPU  Final   Special Requests NONE  Final   Sputum evaluation THIS SPECIMEN IS ACCEPTABLE FOR SPUTUM CULTURE  Final   Report Status 11/14/2015 FINAL  Final  Culture, respiratory (NON-Expectorated)     Status: None (Preliminary result)   Collection Time: 11/14/15  4:20 PM  Result Value Ref Range Status   Specimen Description SPU  Final   Special Requests NONE Reflexed from [place]:[id number]  Final    Gram Stain   Final    MANY WBC SEEN NO SQUAMOUS EPITHELIAL CELLS PRESENT FEW GRAM POSITIVE COCCI    Culture HOLDING FOR POSSIBLE PATHOGEN  Final   Report Status PENDING  Incomplete  C difficile quick scan w PCR reflex     Status: Abnormal   Collection Time: 11/15/15  7:36 AM  Result Value Ref Range Status   C Diff antigen POSITIVE (A) NEGATIVE Final   C Diff toxin NEGATIVE NEGATIVE Final   C Diff interpretation   Final    Negative for toxigenic C. difficile. Toxin gene and active toxin production not detected. May be a nontoxigenic strain of C. difficile bacteria present, lacking the ability to produce toxin.  Clostridium Difficile by PCR     Status: None   Collection Time: 11/15/15  7:36 AM  Result Value Ref Range Status   Toxigenic C Difficile by pcr NEGATIVE NEGATIVE Final    Coagulation Studies: No results for input(s): LABPROT, INR in the last 72 hours.  Urinalysis: No results for input(s): COLORURINE, LABSPEC, PHURINE, GLUCOSEU, HGBUR, BILIRUBINUR, KETONESUR, PROTEINUR, UROBILINOGEN, NITRITE, LEUKOCYTESUR in the last 72 hours.  Invalid input(s): APPERANCEUR    Imaging: Dg Chest 1 View  11/17/2015  CLINICAL DATA:  55 year old female with shortness of breath and respiratory failure. EXAM: CHEST 1 VIEW COMPARISON:  11/15/2015 and prior exams FINDINGS: An endotracheal tube, left subclavian central venous catheter with tip overlying the superior cavoatrial junction an NG tube entering stomach again identified. A left subclavian/axillary stent is again noted. New peripheral right upper lobe opacities are identified. Decreased diffuse bilateral airspace opacities noted. Bilateral pleural effusions and bibasilar opacities, left-greater-than-right, again noted. There is no evidence of pneumothorax. IMPRESSION: New peripheral right upper lobe opacities with decrease diffuse bilateral airspace opacities/ edema otherwise. Electronically Signed   By: Margarette Canada M.D.   On: 11/17/2015 07:09    US Venous Img Upper Uni Right  11/16/2015  CLINICAL DATA:  Right upper extremity edema.  Evaluate for DVT. EXAM: RIGHT UPPER EXTREMITY VENOUS DOPPLER ULTRASOUND TECHNIQUE: Gray-scale sonography with graded compression, as well as color Doppler and duplex ultrasound were performed to evaluate the upper extremity deep venous system from the level of the subclavian vein and including the jugular, axillary, basilic, radial, ulnar and upper cephalic vein. Spectral Doppler was utilized to evaluate flow at rest and with distal augmentation maneuvers. COMPARISON:  None. FINDINGS: Contralateral  Subclavian Vein: Respiratory phasicity is normal and symmetric with the symptomatic side. No evidence of thrombus. Normal compressibility. Internal Jugular Vein: No evidence of thrombus. Normal compressibility, respiratory phasicity and response to augmentation. Subclavian Vein: No evidence of thrombus. Normal compressibility, respiratory phasicity and response to augmentation. Axillary Vein: No evidence of thrombus. Normal compressibility, respiratory phasicity and response to augmentation. Cephalic Vein: Not well visualized secondary to presence of prior dialysis graft. Basilic Vein: Not visualized. Brachial Veins: No evidence of thrombus. Normal compressibility, respiratory phasicity and response to augmentation. Radial Veins: No evidence of thrombus. Normal compressibility, respiratory phasicity and response to augmentation. Ulnar Veins: No evidence of thrombus. Normal compressibility, respiratory phasicity and response to augmentation. Venous Reflux:  None visualized. Other Findings:  None visualized. IMPRESSION: No evidence of DVT within the right upper extremity. Electronically Signed   By: Sandi Mariscal M.D.   On: 11/16/2015 16:06     Medications:   . feeding supplement (VITAL 1.5 CAL) 1,000 mL (11/16/15 1843)  . fentaNYL infusion INTRAVENOUS Stopped (11/16/15 0805)  . norepinephrine (LEVOPHED) Adult infusion 8  mcg/min (11/16/15 0805)   . acidophilus  2 capsule Oral TID  . amiodarone  400 mg Per NG tube Daily  . ampicillin-sulbactam (UNASYN) IV  1.5 g Intravenous Q12H  . antiseptic oral rinse  7 mL Mouth Rinse 10 times per day  . aspirin  81 mg Per NG tube Daily  . atorvastatin  20 mg Per NG tube QPM  . bacitracin   Topical BID  . chlorhexidine gluconate  15 mL Mouth Rinse BID  . cholecalciferol  400 Units Per Tube Daily  . citalopram  20 mg Per NG tube Daily  . clopidogrel  75 mg Per NG tube Daily  . feeding supplement (PRO-STAT SUGAR FREE 64)  30 mL Per Tube TID  . free water  100 mL Per Tube 3 times per day  . hydrocortisone sod succinate (SOLU-CORTEF) inj  25 mg Intravenous Q12H  . insulin aspart  0-9 Units Subcutaneous 6 times per day  . ipratropium-albuterol  3 mL Nebulization Q6H  . lipase/protease/amylase  12,000 Units Oral TID WC  . metoCLOPramide (REGLAN) injection  5 mg Intravenous 4 times per day  . multivitamin  1 tablet Oral QHS  . pantoprazole (PROTONIX) IV  40 mg Intravenous Q24H  . potassium phosphate IVPB (mmol)  30 mmol Intravenous Once  . vancomycin  125 mg Oral 4 times per day  . vitamin B-12  1,000 mcg Per NG tube Daily   acetaminophen **OR** acetaminophen, albuterol, alum & mag hydroxide-simeth, bisacodyl, dextrose, fentaNYL, iohexol, metoCLOPramide (REGLAN) injection, midazolam, midazolam, nitroGLYCERIN, promethazine, senna-docusate  Assessment/ Plan:  55 y.o. 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    [facility] MWF [facility]  1. End Stage Renal Disease:  - Next HD on Monday  2. Anemia of chronic kidney disease:  - hemoglobin currently  9.2 -  Will restart EPO with HD  3.  Acute resp failure - combination of Pneumonia, Anasarca and acute pulm  edema -  Vent assisted - Fio2 28%  4.  Hypokalemia - replace prn  5. Gastroparesis, Nausea and vomiting - symptomatic management      LOS:  [name] 2/25/201710:04 AM

## 2015-11-17 NOTE — Progress Notes (Signed)
PULMONARY / CRITICAL CARE MEDICINE   Name: [name] MRN: [id number] DOB: 1961/06/11    ADMISSION DATE:  11/12/2015   CONSULTATION DATE:  11/14/15  REFERRING MD:  Dr Vianne Bulls  CHIEF COMPLAINT: Hyperkalemia, nausea, vomiting and altered mental status  HISTORY OF PRESENT ILLNESS:   This is a 55 yo AA female with a PMH of ESRD on dialysis, type 2 DM, hypertension, COPD, CHF and CAD admitted with hyperkalemia, dyspnea, nausea, and vomiting. She had emergency hemodialysis on 02/21 for a potassium level of 5.9 with 3L of fluid removed.Her CXR showed bilateral pleural effusions and bilateral lower lobe opacities. Last night(02/21) she became hypoxic, less responsive, hypoglycemic and febrile. She was transferred to the ICU and started on BiPAP. This morning patient became unresponsive and hypotensive necessitating emergent intubation. PCCM was consulted for further management. She is now on pressors, dextrose for hypoglycemia and broad spectrum antibiotics.  Of note, patient had thick dry copious secretions in her airway during intubation. She was exposed to her grand daughter who had strep prior to admission. She is negative for influenza    SUBJECTIVE: Episodes of hyperglycemia and abnormal electrolytes overnight. Remains on full vent support.   VITAL SIGNS: BP 119/53 mmHg  Pulse 77  Temp(Src) 98.1 F (36.7 C) (Axillary)  Resp 25  Ht 5\' 5"  (1.651 m)  Wt 160 lb 11.5 oz (72.9 kg)  BMI 26.74 kg/m2  SpO2 99%  HEMODYNAMICS:   VENTILATOR SETTINGS: Vent Mode:  [-] PSV FiO2 (%):  [28 %-40 %] 28 % Set Rate:  [18 bmp] 18 bmp Vt Set:  [450 mL] 450 mL PEEP:  [5 cmH20] 5 cmH20 Pressure Support:  [19 cmH20] 19 cmH20  INTAKE / OUTPUT: I/O last 3 completed shifts: In: 1273.2 [I.V.:126.5; NG/GT:540; IV Piggyback:606.7] Out: 1000 [Other:1000]  PHYSICAL EXAMINATION: General: Acutely ill looking; awake on the vent Neuro: Awake but not following commands; moves all extremities HEENT: PERRLA,  ETT, oral cavity with copious secretions Cardiovascular:  RRR, S1/S1, +2 PULSES; no edema Lungs: Bilateral airflow, +rhonchi and rales bilaterally Abdomen: Soft, NT/ND, normal bowel sounds Musculoskeletal:  No joint deformities Skin: Warm, dry  LABS:  BMET  Recent Labs Lab 11/15/15 0549 11/16/15 0453 11/17/15 0507  NA 129* 127* 133*  K 3.4* 3.6 2.6*  CL 92* 89* 94*  CO2 27 27 30   BUN 33* 44* 32*  CREATININE 4.02* 4.54* 2.81*  GLUCOSE 146* 115* 231*    Electrolytes  Recent Labs Lab 11/15/15 0549 11/16/15 0453 11/17/15 0507  CALCIUM 7.6* 7.8* 8.0*  MG 1.6* 1.9 1.8  PHOS 2.2* 2.0* <1.0*    CBC  Recent Labs Lab 11/15/15 0549 11/16/15 0453 11/17/15 0507  WBC 3.9 9.8 15.2*  HGB 10.1* 9.3* 9.2*  HCT 31.7* 28.6* 28.4*  PLT 108* 81* 65*    Coag's No results for input(s): APTT, INR in the last 168 hours.  Sepsis Markers No results for input(s): LATICACIDVEN, PROCALCITON, O2SATVEN in the last 168 hours.  ABG  Recent Labs Lab 11/13/15 2118 11/14/15 1415 11/17/15 0500  PHART 7.44 7.48* 7.47*  PCO2ART 49* 40 43  PO2ART 73* 65* 77*    Liver Enzymes  Recent Labs Lab 11/12/15 0622 11/13/15 0744 11/13/15 1425  AST 18 25 20   ALT 12* 13* 11*  ALKPHOS 125 137* 144*  BILITOT 1.0 0.9 1.1  ALBUMIN 3.2* 3.0* 3.1*    Cardiac Enzymes  Recent Labs Lab 11/12/15 0622  TROPONINI <0.03    Glucose  Recent Labs Lab 11/16/15 1605 11/16/15  2120 11/17/15 0041 11/17/15 0348 11/17/15 0719 11/17/15 1143  GLUCAP 166* 292* 336* 259* 207* 258*    Imaging Dg Chest 1 View  11/17/2015  CLINICAL DATA:  55 year old female with shortness of breath and respiratory failure. EXAM: CHEST 1 VIEW COMPARISON:  11/15/2015 and prior exams FINDINGS: An endotracheal tube, left subclavian central venous catheter with tip overlying the superior cavoatrial junction an NG tube entering stomach again identified. A left subclavian/axillary stent is again noted. New peripheral  right upper lobe opacities are identified. Decreased diffuse bilateral airspace opacities noted. Bilateral pleural effusions and bibasilar opacities, left-greater-than-right, again noted. There is no evidence of pneumothorax. IMPRESSION: New peripheral right upper lobe opacities with decrease diffuse bilateral airspace opacities/ edema otherwise. Electronically Signed   By: Margarette Canada M.D.   On: 11/17/2015 07:09   Dg Abd 1 View  11/17/2015  CLINICAL DATA:  Feeding tube placement EXAM: ABDOMEN - 1 VIEW COMPARISON:  11/14/2015 FINDINGS: NG tube is in place with the tip in the mid stomach and the side-port in the proximal to mid stomach. Prior cholecystectomy. IMPRESSION: NG tube tip in the mid stomach. Electronically Signed   By: Rolm Baptise M.D.   On: 11/17/2015 10:57   US Venous Img Upper Uni Right  11/16/2015  CLINICAL DATA:  Right upper extremity edema.  Evaluate for DVT. EXAM: RIGHT UPPER EXTREMITY VENOUS DOPPLER ULTRASOUND TECHNIQUE: Gray-scale sonography with graded compression, as well as color Doppler and duplex ultrasound were performed to evaluate the upper extremity deep venous system from the level of the subclavian vein and including the jugular, axillary, basilic, radial, ulnar and upper cephalic vein. Spectral Doppler was utilized to evaluate flow at rest and with distal augmentation maneuvers. COMPARISON:  None. FINDINGS: Contralateral Subclavian Vein: Respiratory phasicity is normal and symmetric with the symptomatic side. No evidence of thrombus. Normal compressibility. Internal Jugular Vein: No evidence of thrombus. Normal compressibility, respiratory phasicity and response to augmentation. Subclavian Vein: No evidence of thrombus. Normal compressibility, respiratory phasicity and response to augmentation. Axillary Vein: No evidence of thrombus. Normal compressibility, respiratory phasicity and response to augmentation. Cephalic Vein: Not well visualized secondary to presence of prior  dialysis graft. Basilic Vein: Not visualized. Brachial Veins: No evidence of thrombus. Normal compressibility, respiratory phasicity and response to augmentation. Radial Veins: No evidence of thrombus. Normal compressibility, respiratory phasicity and response to augmentation. Ulnar Veins: No evidence of thrombus. Normal compressibility, respiratory phasicity and response to augmentation. Venous Reflux:  None visualized. Other Findings:  None visualized. IMPRESSION: No evidence of DVT within the right upper extremity. Electronically Signed   By: Sandi Mariscal M.D.   On: 11/16/2015 16:06     STUDIES:  Last Echo 09/2015>EF 40%, Left ventricular dysfunction/hypertrophy, systolic and diastolic function(grade 1), moderate MR, severe TR and moderate pleural effusions  CULTURES: Blood 02/22>NTD Urine 02/22>Negative Sputum 02/22>few GPC C-dif antigen 02/23>Positive; toxin negative  ANTIBIOTICS: Zosyn 02/22>>02/25 Vancomycin 02/22>>02/25 Unasyn 02/25>> Oral vanco 02/23>>  SIGNIFICANT EVENTS: 02/20>Admitted 02/21>Transferred to ICU 02/22>Intubated  LINES/TUBES:  Left subclavian TLC 02/22>> ETT 02/22>>  DISCUSSION: 55 YO female with ESRD on dialysis presenting with acute hypoxic respiratory failure secondary to bilateral pneumonia-aspiration versus bacteria; septic shock and severe hypoglycemia  PULMONARY A: Acute hypoxic respiratory failure secondary to pneumonia and pulmonary edema Bacteria versus aspiration pneumonia H/O COPD P:  -Full vent support-setting reviewed -PSV trials as tolerated -Continue IV steroids and taper off   -Bronchodilator -Antibiotics as above  CARDIOVASCULAR A:  Septic shock-resolving Systolic and diastolic heart failure H/o  CAD H/o Hypertension P:  -Levophed gtt and titrate to MAP goal of >65 -Hold antihypertensive 2/2 shock -Hemodynamics per ICU protocol -Continue plavix  RENAL A:  ESRD on dialysis Hypokalemia   Hypophosphatemia Hypomagnesemia Hyponatremia P:  -Nephrology following -Dialysis per nephrology -Monitor and correct electrolyte imbalances  GASTROINTESTINAL A:  Nausea/vomiting-Resolved Dislodged OGT P:  -Replace OGT  -Antiemetics prn -PPI for GI prophylaxis -TFs as tolerated  HEMATOLOGIC A:  Anemia of chronic disease Thrombocytopenia P:  -Monitor H/H and transfuse per ICU protocol -F/U HIT screen  INFECTIOUS A:  Sepsis-likely source respiratory; worsening leukocytosis; cultures negative to date Suspect aspiration pneumonia C-Diff antigen positive P:  -Vanc and zosyn discontinued -Continue Unasyn -f/u Cultures -Vancomycin oral   ENDOCRINE A:  Refractory hypoglycemia, now hyperglycemic Type 2 DM P:  -Blood glucose monitoring q4h  -No sliding scale coverage for now -will discontinue D10 per endocrine recs.   NEUROLOGIC A:  Acute metabolic encephalopathy P:  RASS goal: -1 -Fentanyl and versed for vent sedation   Best Practice: Code Status:  Full. Diet: NPO GI prophylaxis:  PPI. VTE prophylaxis:  SCD's /no pharmacologic DVT prophylaxis-low platelets  Magdalene S. [facility] ANP-BC Pulmonary and [place] Pager [phone] 11/17/2015, 12:13 PM  PCCM ATTENDING ATTESTATION:  I have evaluated patient with ANP Patria Mane, reviewed database in its entirety and discussed care plan in detail. In addition, this patient was discussed on multidisciplinary rounds.   Major problems addressed by PCCM team: Ventilator dependent - ab le to tolerate PSV OGT malpositioned - repositioned Copious TFs suctioned from mouth Septic shock improving - MAP goal adjusted to 60 mmHg ESRD Possible C diff  PLAN/REC: As above.    Merton Border, MD PCCM service Mobile [phone] Pager [phone]

## 2015-11-17 NOTE — Consult Note (Signed)
Pharmacy Antibiotic Note  [name] is a 55 y.o. female admitted on 11/12/2015 with pneumonia.  Pharmacy has been consulted for unasyn dosing.  Plan: amp/sul 1.5g q 12 hours  Height: 5\' 5"  (165.1 cm) Weight: 160 lb 11.5 oz (72.9 kg) IBW/kg (Calculated) : 57  Temp (24hrs), Avg:97.7 F (36.5 C), Min:97 F (36.1 C), Max:98.9 F (37.2 C)   Recent Labs Lab 11/12/15 0556  11/13/15 0744 11/13/15 1425 11/14/15 0053 11/15/15 0549 11/16/15 0453 11/17/15 0507  WBC 8.7  --   --   --  3.0* 3.9 9.8 15.2*  CREATININE  --   < > 3.78* 4.13*  --  4.02* 4.54* 2.81*  < > = values in this interval not displayed.  Estimated Creatinine Clearance: 22.4 mL/min (by C-G formula based on Cr of 2.81).    Allergies  Allergen Reactions  . Compazine [Prochlorperazine Edisylate] Anaphylaxis and Nausea And Vomiting  . Ace Inhibitors Swelling  . Ativan [Lorazepam] Other (See Comments)    Reaction:  Hallucinations and headaches  . Codeine Nausea And Vomiting  . Gabapentin Other (See Comments)    Reaction:  Unknown   . Losartan Other (See Comments)    Reaction:  Unknown   . Ondansetron Other (See Comments)    Reaction:  Unknown   . Prochlorperazine Other (See Comments)    Reaction:  Unknown   . Scopolamine Other (See Comments)    Reaction:  Unknown   . Zofran [Ondansetron Hcl] Other (See Comments)    Reaction:  hallucinations   . Oxycodone Anxiety  . Tape Rash    Antimicrobials this admission: Zosyn 2/22 >> 2/25 Vancomycin IV 2/22>> 02/24 Vancomycin PO 2/23>> Amp/sul 2/25>>  Dose adjustments this admission:   Microbiology results: 2/22 BCx x 2: no growth x 2 day  2/22 Sputum Cx: pending  2/22 UCx: negative  2/22 MRSA PCR: negative 2/23 Cdiff: negative  Thank you for allowing pharmacy to be a part of this patient's care.  Ramond Dial, Pharm.D Clinical Pharmacist 11/17/2015 10:05 AM

## 2015-11-17 NOTE — Progress Notes (Signed)
Pharmacy Antibiotic Note Day 1  [name] is a 55 y.o. female admitted on 11/12/2015 with sepsis.  Pharmacy has been consulted for Zosyn  dosing. Patient is negative for clostridium difficile, but has significant history of Cdiff requiring fecal transplant. Patient ordered vancomycin 125mg  VT Q6hr while on broad spectrum antibiotics.   Plan: Will continue Zosyn EI 3.375g IV Q12hr.    Height: 5\' 5"  (165.1 cm) Weight: 160 lb 11.5 oz (72.9 kg) IBW/kg (Calculated) : 57  Temp (24hrs), Avg:97.7 F (36.5 C), Min:97 F (36.1 C), Max:98.9 F (37.2 C)   Recent Labs Lab 11/12/15 0556  11/13/15 0744 11/13/15 1425 11/14/15 0053 11/15/15 0549 11/16/15 0453 11/17/15 0507  WBC 8.7  --   --   --  3.0* 3.9 9.8 15.2*  CREATININE  --   < > 3.78* 4.13*  --  4.02* 4.54* 2.81*  < > = values in this interval not displayed.  Estimated Creatinine Clearance: 22.4 mL/min (by C-G formula based on Cr of 2.81).    Allergies  Allergen Reactions  . Compazine [Prochlorperazine Edisylate] Anaphylaxis and Nausea And Vomiting  . Ace Inhibitors Swelling  . Ativan [Lorazepam] Other (See Comments)    Reaction:  Hallucinations and headaches  . Codeine Nausea And Vomiting  . Gabapentin Other (See Comments)    Reaction:  Unknown   . Losartan Other (See Comments)    Reaction:  Unknown   . Ondansetron Other (See Comments)    Reaction:  Unknown   . Prochlorperazine Other (See Comments)    Reaction:  Unknown   . Scopolamine Other (See Comments)    Reaction:  Unknown   . Zofran [Ondansetron Hcl] Other (See Comments)    Reaction:  hallucinations   . Oxycodone Anxiety  . Tape Rash    Antimicrobials this admission: Zosyn 2/22 >>  Vancomycin IV 2/22>> 02/24 Vancomycin PO 2/23>>   Microbiology results: 2/22 BCx x 2: no growth x 2 day  2/22 Sputum Cx: pending   2/22 UCx: negative  2/22 MRSA PCR: negative 2/23 Cdiff: negative  Pharmacy will continue to monitor and adjust per consult.    Ulice Dash D 11/17/2015 8:08 AM

## 2015-11-17 NOTE — Progress Notes (Signed)
[place] NOTE  Pharmacy Consult for electrolyte replacement     Allergies  Allergen Reactions  . Compazine [Prochlorperazine Edisylate] Anaphylaxis and Nausea And Vomiting  . Ace Inhibitors Swelling  . Ativan [Lorazepam] Other (See Comments)    Reaction:  Hallucinations and headaches  . Codeine Nausea And Vomiting  . Gabapentin Other (See Comments)    Reaction:  Unknown   . Losartan Other (See Comments)    Reaction:  Unknown   . Ondansetron Other (See Comments)    Reaction:  Unknown   . Prochlorperazine Other (See Comments)    Reaction:  Unknown   . Scopolamine Other (See Comments)    Reaction:  Unknown   . Zofran [Ondansetron Hcl] Other (See Comments)    Reaction:  hallucinations   . Oxycodone Anxiety  . Tape Rash    Patient Measurements: Height: 5\' 5"  (165.1 cm) Weight: 160 lb 11.5 oz (72.9 kg) IBW/kg (Calculated) : 57   Vital Signs: Temp: 97.1 F (36.2 C) (02/24 2000) Temp Source: Axillary (02/24 2000) BP: 122/47 mmHg (02/25 0600) Pulse Rate: 78 (02/25 0600) Intake/Output from previous day: 02/24 0701 - 02/25 0700 In: 1169.5 [I.V.:117.8; NG/GT:495; IV Piggyback:556.7] Out: 1000  Intake/Output from this shift:   Vent settings for last 24 hours: Vent Mode:  [-] PRVC FiO2 (%):  [40 %] 40 % Set Rate:  [18 bmp] 18 bmp Vt Set:  [450 mL] 450 mL PEEP:  [5 cmH20] 5 cmH20 Plateau Pressure:  [21 cmH20-28 cmH20] 21 cmH20  Labs:  Recent Labs  11/15/15 0549 11/16/15 0453 11/17/15 0507  WBC 3.9 9.8 15.2*  HGB 10.1* 9.3* 9.2*  HCT 31.7* 28.6* 28.4*  PLT 108* 81* 65*  CREATININE 4.02* 4.54* 2.81*  MG 1.6* 1.9 1.8  PHOS 2.2* 2.0* <1.0*   Estimated Creatinine Clearance: 22.4 mL/min (by C-G formula based on Cr of 2.81).   Recent Labs  11/16/15 2120 11/17/15 0041 11/17/15 0348  GLUCAP 292* 336* 47*    Microbiology: Recent Results (from the past 720 hour(s))  MRSA PCR Screening     Status: None   Collection Time: 11/13/15  9:46  PM  Result Value Ref Range Status   MRSA by PCR NEGATIVE NEGATIVE Final    Comment:        The GeneXpert MRSA Assay (FDA approved for NASAL specimens only), is one component of a comprehensive MRSA colonization surveillance program. It is not intended to diagnose MRSA infection nor to guide or monitor treatment for MRSA infections.   Culture, blood (Routine X 2) w Reflex to ID Panel     Status: None (Preliminary result)   Collection Time: 11/14/15 12:53 AM  Result Value Ref Range Status   Specimen Description BLOOD RIGHT HAND  Final   Special Requests BOTTLES DRAWN AEROBIC AND ANAEROBIC 5ML  Final   Culture NO GROWTH 2 DAYS  Final   Report Status PENDING  Incomplete  Culture, blood (Routine X 2) w Reflex to ID Panel     Status: None (Preliminary result)   Collection Time: 11/14/15  7:14 AM  Result Value Ref Range Status   Specimen Description BLOOD RIGHT HAND  Final   Special Requests   Final    BOTTLES DRAWN AEROBIC AND ANAEROBIC AER 4ML ANA 2ML   Culture NO GROWTH 2 DAYS  Final   Report Status PENDING  Incomplete  Urine culture     Status: None   Collection Time: 11/14/15 10:16 AM  Result Value Ref Range Status  Specimen Description URINE, RANDOM  Final   Special Requests NONE  Final   Culture NO GROWTH 1 DAY  Final   Report Status 11/15/2015 FINAL  Final  Culture, expectorated sputum-assessment     Status: None   Collection Time: 11/14/15  4:20 PM  Result Value Ref Range Status   Specimen Description SPU  Final   Special Requests NONE  Final   Sputum evaluation THIS SPECIMEN IS ACCEPTABLE FOR SPUTUM CULTURE  Final   Report Status 11/14/2015 FINAL  Final  Culture, respiratory (NON-Expectorated)     Status: None (Preliminary result)   Collection Time: 11/14/15  4:20 PM  Result Value Ref Range Status   Specimen Description SPU  Final   Special Requests NONE Reflexed from [id number]  Final   Gram Stain   Final    MANY WBC SEEN NO SQUAMOUS EPITHELIAL CELLS PRESENT FEW  GRAM POSITIVE COCCI    Culture HOLDING FOR POSSIBLE PATHOGEN  Final   Report Status PENDING  Incomplete  C difficile quick scan w PCR reflex     Status: Abnormal   Collection Time: 11/15/15  7:36 AM  Result Value Ref Range Status   C Diff antigen POSITIVE (A) NEGATIVE Final   C Diff toxin NEGATIVE NEGATIVE Final   C Diff interpretation   Final    Negative for toxigenic C. difficile. Toxin gene and active toxin production not detected. May be a nontoxigenic strain of C. difficile bacteria present, lacking the ability to produce toxin.  Clostridium Difficile by PCR     Status: None   Collection Time: 11/15/15  7:36 AM  Result Value Ref Range Status   Toxigenic C Difficile by pcr NEGATIVE NEGATIVE Final    Medications:  Scheduled:  . acidophilus  2 capsule Oral TID  . amiodarone  400 mg Per NG tube Daily  . antiseptic oral rinse  7 mL Mouth Rinse 10 times per day  . aspirin  81 mg Per NG tube Daily  . atorvastatin  20 mg Per NG tube QPM  . bacitracin   Topical BID  . chlorhexidine gluconate  15 mL Mouth Rinse BID  . cholecalciferol  400 Units Per Tube Daily  . citalopram  20 mg Per NG tube Daily  . clopidogrel  75 mg Per NG tube Daily  . feeding supplement (PRO-STAT SUGAR FREE 64)  30 mL Per Tube TID  . free water  100 mL Per Tube 3 times per day  . hydrocortisone sod succinate (SOLU-CORTEF) inj  25 mg Intravenous Q12H  . insulin aspart  0-9 Units Subcutaneous 6 times per day  . ipratropium-albuterol  3 mL Nebulization Q6H  . lipase/protease/amylase  12,000 Units Oral TID WC  . metoCLOPramide (REGLAN) injection  5 mg Intravenous 4 times per day  . multivitamin  1 tablet Oral QHS  . pantoprazole (PROTONIX) IV  40 mg Intravenous Q24H  . piperacillin-tazobactam (ZOSYN)  IV  3.375 g Intravenous Q12H  . potassium phosphate IVPB (mmol)  30 mmol Intravenous Once  . vancomycin  125 mg Oral 4 times per day  . vitamin B-12  1,000 mcg Per NG tube Daily   Infusions:  . feeding supplement  (VITAL 1.5 CAL) 1,000 mL (11/16/15 1843)  . fentaNYL infusion INTRAVENOUS Stopped (11/16/15 0805)  . norepinephrine (LEVOPHED) Adult infusion 8 mcg/min (11/16/15 0805)    Assessment: Pharmacy consulted for electrolyte management for 55 yo female ICU patient requiring mechanical ventilation, tube feeds, and dialysis. Patient is not receiving scheduled dialysis -  last HD session 2/24   Plan:  2/25 Phos this AM was <1, K 2.6. Patient is now s/p MD orders for Kphos 30 mmol iv x 1. Recheck labs at 1800.  Pharmacy will continue to monitor and adjust per consult.    Vena Rua 11/17/2015,7:32 AM

## 2015-11-17 NOTE — Progress Notes (Signed)
[place] Progress Note Patient Name: [name] DOB: [birth year] MRN: [id number]   Date of Service  11/17/2015  HPI/Events of Note  Hyperglycemia with blood sugar of 336.  Had issues with hypoglycemia yesterday.  Now on TFs.  Is ESRD.  eICU Interventions  Place on q4 hour SSI sensitive scale     Intervention Category Intermediate Interventions: Hyperglycemia - evaluation and treatment  DETERDING,ELIZABETH 11/17/2015, 12:54 AM

## 2015-11-17 NOTE — Progress Notes (Addendum)
[facility] NAME: [name]    MR#:  [id number]  DATE OF BIRTH:  1961/05/24  CHIEF COMPLAINT:   Chief Complaint  Patient presents with  . Nausea  . Emesis   Patient remains on the ventilator. Her eyes are open but unable to follow commands.    REVIEW OF SYSTEMS:    Review of Systems  Unable to perform ROS: critical illness         DRUG ALLERGIES:   Allergies  Allergen Reactions  . Compazine [Prochlorperazine Edisylate] Anaphylaxis and Nausea And Vomiting  . Ace Inhibitors Swelling  . Ativan [Lorazepam] Other (See Comments)    Reaction:  Hallucinations and headaches  . Codeine Nausea And Vomiting  . Gabapentin Other (See Comments)    Reaction:  Unknown   . Losartan Other (See Comments)    Reaction:  Unknown   . Ondansetron Other (See Comments)    Reaction:  Unknown   . Prochlorperazine Other (See Comments)    Reaction:  Unknown   . Scopolamine Other (See Comments)    Reaction:  Unknown   . Zofran [Ondansetron Hcl] Other (See Comments)    Reaction:  hallucinations   . Oxycodone Anxiety  . Tape Rash    VITALS:  Blood pressure 122/47, pulse 78, temperature 97.1 F (36.2 C), temperature source Axillary, resp. rate 24, height 5\' 5"  (1.651 m), weight 72.9 kg (160 lb 11.5 oz), SpO2 100 %.  PHYSICAL EXAMINATION:   Physical Exam  GENERAL:  55 y.o.-year-old patient lying in the bed  intubated, critically ill female EYES: Pupils equal, round, reactive to light and accommodation. No scleral icterus. HEENT: Head atraumatic, normocephalic. ET tube in place  NECK:  Supple, no jugular venous distention. No thyroid enlargement, no tenderness.  LUNGS: , On a mechanical ventilator diminished breath sounds bilaterally without any rales rhonchi wheezing  CARDIOVASCULAR: S1, S2 normal. No murmurs, rubs, or gallops.  ABDOMEN: Soft, nontender, nondistended. Bowel sounds present. No organomegaly or mass.  EXTREMITIES:  Right hand edema. NEUROLOGIC:  Currently intubated eyes open not able to follow commands. .PSYCHIATRIC:lntubated  not anxious  SKIN: No obvious rash, lesion, or ulcer.    LABORATORY PANEL:   CBC  Recent Labs Lab 11/17/15 0507  WBC 15.2*  HGB 9.2*  HCT 28.4*  PLT 65*   ------------------------------------------------------------------------------------------------------------------  Chemistries   Recent Labs Lab 11/13/15 1425  11/17/15 0507  NA 133*  < > 133*  K 5.0  < > 2.6*  CL 93*  < > 94*  CO2 32  < > 30  GLUCOSE 78  < > 231*  BUN 22*  < > 32*  CREATININE 4.13*  < > 2.81*  CALCIUM 8.4*  < > 8.0*  MG  --   < > 1.8  AST 20  --   --   ALT 11*  --   --   ALKPHOS 144*  --   --   BILITOT 1.1  --   --   < > = values in this interval not displayed. ------------------------------------------------------------------------------------------------------------------  Cardiac Enzymes  Recent Labs Lab 11/12/15 0622  TROPONINI <0.03   ------------------------------------------------------------------------------------------------------------------  RADIOLOGY:  Dg Chest 1 View  11/17/2015  CLINICAL DATA:  55 year old female with shortness of breath and respiratory failure. EXAM: CHEST 1 VIEW COMPARISON:  11/15/2015 and prior exams FINDINGS: An endotracheal tube, left subclavian central venous catheter with tip overlying the superior cavoatrial junction an NG tube entering stomach again identified.  A left subclavian/axillary stent is again noted. New peripheral right upper lobe opacities are identified. Decreased diffuse bilateral airspace opacities noted. Bilateral pleural effusions and bibasilar opacities, left-greater-than-right, again noted. There is no evidence of pneumothorax. IMPRESSION: New peripheral right upper lobe opacities with decrease diffuse bilateral airspace opacities/ edema otherwise. Electronically Signed   By: Margarette Canada M.D.   On: 11/17/2015 07:09   US  Venous Img Upper Uni Right  11/16/2015  CLINICAL DATA:  Right upper extremity edema.  Evaluate for DVT. EXAM: RIGHT UPPER EXTREMITY VENOUS DOPPLER ULTRASOUND TECHNIQUE: Gray-scale sonography with graded compression, as well as color Doppler and duplex ultrasound were performed to evaluate the upper extremity deep venous system from the level of the subclavian vein and including the jugular, axillary, basilic, radial, ulnar and upper cephalic vein. Spectral Doppler was utilized to evaluate flow at rest and with distal augmentation maneuvers. COMPARISON:  None. FINDINGS: Contralateral Subclavian Vein: Respiratory phasicity is normal and symmetric with the symptomatic side. No evidence of thrombus. Normal compressibility. Internal Jugular Vein: No evidence of thrombus. Normal compressibility, respiratory phasicity and response to augmentation. Subclavian Vein: No evidence of thrombus. Normal compressibility, respiratory phasicity and response to augmentation. Axillary Vein: No evidence of thrombus. Normal compressibility, respiratory phasicity and response to augmentation. Cephalic Vein: Not well visualized secondary to presence of prior dialysis graft. Basilic Vein: Not visualized. Brachial Veins: No evidence of thrombus. Normal compressibility, respiratory phasicity and response to augmentation. Radial Veins: No evidence of thrombus. Normal compressibility, respiratory phasicity and response to augmentation. Ulnar Veins: No evidence of thrombus. Normal compressibility, respiratory phasicity and response to augmentation. Venous Reflux:  None visualized. Other Findings:  None visualized. IMPRESSION: No evidence of DVT within the right upper extremity. Electronically Signed   By: Sandi Mariscal M.D.   On: 11/16/2015 16:06     ASSESSMENT AND PLAN:     1. Acute respiratory failure secondary to pneumonia, pulmonary edema: Continue full vent support, fluid removal per nephrology Continue by mouth vancomycin,  Zosyn.  #2. Septic shock: Secondary to C. difficile, pneumonia: Continue Levophed antibiotics, continue vancomycin and Zosyn. WBC continues to be slightly elevated  #2 severe electrolyte imbalance ; currently being replaced   #3 gastroparesis secondary to diabetes mellitus type 2: Continue Phenergan, Reglan.  #4. Diabetes mellitus type 2 with hypoglycemia:  hypoglycemia improved. Hypoglycemia due to sepsis. Currently off D10. Continue blood sugar.  Appreciate endocrinology input Hypoglycemia likely related to sepsis  #5 acute on chronic diastolic heart failure in setting of fall ESRD: Continue dialysis as per nephrology recommendation  #6 Coronary artery disease patient is on Plavix, Coreg, amiodarone continue  #7 chronic pancreatitis: Continue Creon  #8 right arm edema, exclude DVT: No evidence of DVT #9 thrombocytopenia: Currently not receiving heparin platelet count continues to drop. Could be related to Zosyn 10. Check recurrent C. difficile, status post stool transplant: Continue isolation because of her positive C. difficile antigen  positive with chronic carrier state. And prophylactic Vancomycin was started.   Prognosis very poor  All the records are reviewed and case discussed with Care Management/Social Workerr. Management plans discussed with the patient, family and they are in agreement.  CODE STATUS:full  TOTAL TIME TAKING CARE OF THIS PATIENT:  35 minutes of critical care  condition guarded with multiple issues high risk of cardiopulmonary arrest   Dustin Flock M.D on 11/17/2015 at 9:41 AM  Between 7am to 6pm - Pager - [phone]  After 6pm go to [email or web address] - password [email or web address] [facility]  [facility]  Office  [phone]  CC: Primary care physician; Ellamae Sia, MD

## 2015-11-17 NOTE — Progress Notes (Signed)
Pt with FSBS of 336.  Elink MD notified.  New orders received.

## 2015-11-18 ENCOUNTER — Inpatient Hospital Stay: Payer: Medicare Other

## 2015-11-18 DIAGNOSIS — A047 Enterocolitis due to Clostridium difficile: Secondary | ICD-10-CM

## 2015-11-18 LAB — CBC
HCT: 29.5 % — ABNORMAL LOW (ref 35.0–47.0)
Hemoglobin: 9.6 g/dL — ABNORMAL LOW (ref 12.0–16.0)
MCH: 26.8 pg (ref 26.0–34.0)
MCHC: 32.5 g/dL (ref 32.0–36.0)
MCV: 82.3 fL (ref 80.0–100.0)
Platelets: 82 10*3/uL — ABNORMAL LOW (ref 150–440)
RBC: 3.59 MIL/uL — ABNORMAL LOW (ref 3.80–5.20)
RDW: 19.8 % — ABNORMAL HIGH (ref 11.5–14.5)
WBC: 21.3 10*3/uL — ABNORMAL HIGH (ref 3.6–11.0)

## 2015-11-18 LAB — HEPARIN INDUCED PLATELET AB (HIT ANTIBODY): Heparin Induced Plt Ab: 0.236 OD (ref 0.000–0.400)

## 2015-11-18 LAB — GLUCOSE, CAPILLARY
Glucose-Capillary: 116 mg/dL — ABNORMAL HIGH (ref 65–99)
Glucose-Capillary: 143 mg/dL — ABNORMAL HIGH (ref 65–99)
Glucose-Capillary: 149 mg/dL — ABNORMAL HIGH (ref 65–99)
Glucose-Capillary: 199 mg/dL — ABNORMAL HIGH (ref 65–99)
Glucose-Capillary: 254 mg/dL — ABNORMAL HIGH (ref 65–99)
Glucose-Capillary: 69 mg/dL (ref 65–99)
Glucose-Capillary: 71 mg/dL (ref 65–99)
Glucose-Capillary: 73 mg/dL (ref 65–99)
Glucose-Capillary: 75 mg/dL (ref 65–99)

## 2015-11-18 LAB — CULTURE, RESPIRATORY W GRAM STAIN

## 2015-11-18 LAB — BASIC METABOLIC PANEL
Anion gap: 10 (ref 5–15)
BUN: 51 mg/dL — ABNORMAL HIGH (ref 6–20)
CO2: 30 mmol/L (ref 22–32)
Calcium: 8.5 mg/dL — ABNORMAL LOW (ref 8.9–10.3)
Chloride: 93 mmol/L — ABNORMAL LOW (ref 101–111)
Creatinine, Ser: 3.45 mg/dL — ABNORMAL HIGH (ref 0.44–1.00)
GFR calc Af Amer: 16 mL/min — ABNORMAL LOW (ref >60)
GFR calc non Af Amer: 14 mL/min — ABNORMAL LOW (ref >60)
Glucose, Bld: 117 mg/dL — ABNORMAL HIGH (ref 65–99)
Potassium: 3 mmol/L — ABNORMAL LOW (ref 3.5–5.1)
Sodium: 133 mmol/L — ABNORMAL LOW (ref 135–145)

## 2015-11-18 LAB — HEPATITIS B SURFACE ANTIGEN: Hepatitis B Surface Ag: NEGATIVE

## 2015-11-18 MED ORDER — PANTOPRAZOLE SODIUM 40 MG PO TBEC
40.0000 mg | DELAYED_RELEASE_TABLET | Freq: Every day | ORAL | Status: DC
  Administered 2015-11-20 – 2015-11-29 (×7): 40 mg via ORAL
  Filled 2015-11-18 (×8): qty 1

## 2015-11-18 MED ORDER — ATORVASTATIN CALCIUM 20 MG PO TABS
20.0000 mg | ORAL_TABLET | Freq: Every evening | ORAL | Status: DC
Start: 2015-11-18 — End: 2015-12-01
  Administered 2015-11-19 – 2015-11-30 (×12): 20 mg via ORAL
  Filled 2015-11-18 (×13): qty 1

## 2015-11-18 MED ORDER — DEXTROSE 50 % IV SOLN
INTRAVENOUS | Status: AC
  Administered 2015-11-18: 50 mL
  Filled 2015-11-18: qty 50

## 2015-11-18 MED ORDER — ALBUMIN HUMAN 25 % IV SOLN
12.5000 g | Freq: Once | INTRAVENOUS | Status: AC
  Administered 2015-11-18: 12.5 g via INTRAVENOUS
  Filled 2015-11-18: qty 50

## 2015-11-18 MED ORDER — CLOPIDOGREL BISULFATE 75 MG PO TABS
75.0000 mg | ORAL_TABLET | Freq: Every day | ORAL | Status: DC
  Administered 2015-11-20 – 2015-12-01 (×10): 75 mg via ORAL
  Filled 2015-11-18 (×10): qty 1

## 2015-11-18 MED ORDER — AMIODARONE HCL 200 MG PO TABS
200.0000 mg | ORAL_TABLET | Freq: Every day | ORAL | Status: DC
  Administered 2015-11-20 – 2015-12-01 (×10): 200 mg via ORAL
  Filled 2015-11-18 (×10): qty 1

## 2015-11-18 MED ORDER — ASPIRIN 81 MG PO CHEW
81.0000 mg | CHEWABLE_TABLET | Freq: Every day | ORAL | Status: DC
  Administered 2015-11-20: 81 mg via ORAL
  Filled 2015-11-18: qty 1

## 2015-11-18 MED ORDER — CITALOPRAM HYDROBROMIDE 20 MG PO TABS
20.0000 mg | ORAL_TABLET | Freq: Every day | ORAL | Status: DC
Start: 2015-11-19 — End: 2015-12-01
  Administered 2015-11-20 – 2015-12-01 (×10): 20 mg via ORAL
  Filled 2015-11-18 (×10): qty 1

## 2015-11-18 MED ORDER — INSULIN ASPART 100 UNIT/ML ~~LOC~~ SOLN
0.0000 [IU] | SUBCUTANEOUS | Status: DC
  Administered 2015-11-18: 8 [IU] via SUBCUTANEOUS
  Administered 2015-11-18: 3 [IU] via SUBCUTANEOUS
  Administered 2015-11-19 (×2): 2 [IU] via SUBCUTANEOUS
  Administered 2015-11-20: 3 [IU] via SUBCUTANEOUS
  Administered 2015-11-20 – 2015-11-21 (×3): 2 [IU] via SUBCUTANEOUS
  Administered 2015-11-21: 3 [IU] via SUBCUTANEOUS
  Filled 2015-11-18: qty 3
  Filled 2015-11-18: qty 2
  Filled 2015-11-18: qty 3
  Filled 2015-11-18 (×2): qty 2
  Filled 2015-11-18: qty 8
  Filled 2015-11-18 (×3): qty 2
  Filled 2015-11-18: qty 3

## 2015-11-18 MED ORDER — VITAMIN B-12 1000 MCG PO TABS
1000.0000 ug | ORAL_TABLET | Freq: Every day | ORAL | Status: DC
  Administered 2015-11-20 – 2015-12-01 (×10): 1000 ug via ORAL
  Filled 2015-11-18 (×10): qty 1

## 2015-11-18 NOTE — Progress Notes (Signed)
Subjective:  Intubated 2/22 Remains critically ill Opening eyes, trying to mouth words  Low K, Low Phos being replaced  Requiring minimum dose of levophed   Objective:  Vital signs in last 24 hours:  Temp:  [97.9 F (36.6 C)] 97.9 F (36.6 C) (02/25 1945) Pulse Rate:  [60-69] 66 (02/26 0500) Resp:  [18-21] 19 (02/26 0500) BP: (97-139)/(47-61) 139/51 mmHg (02/26 0500) SpO2:  [96 %-100 %] 100 % (02/26 0855) FiO2 (%):  [28 %] 28 % (02/26 0856) Weight:  [75.9 kg (167 lb 5.3 oz)] 75.9 kg (167 lb 5.3 oz) (02/26 0428)  Weight change: 3 kg (6 lb 9.8 oz) Filed Weights   11/16/15 0517 11/17/15 0521 11/18/15 0428  Weight: 76.1 kg (167 lb 12.3 oz) 72.9 kg (160 lb 11.5 oz) 75.9 kg (167 lb 5.3 oz)    Intake/Output:   No intake or output data in the 24 hours ending 11/18/15 1149   Physical Exam: General: Critically ill appearing  HEENT Anicteric, dry oral mucus membranes  Neck supple  Pulm/lungs Vent assisted  CVS/Heart No rub  Abdomen:  Soft, Non tender  Extremities: + dependent edema  Neurologic:   opens eyes  Skin: No acute rashes  Access: AVF       Basic Metabolic Panel:   Recent Labs Lab 11/13/15 1425 11/15/15 0549 11/16/15 0453 11/17/15 0507 11/17/15 1754 11/18/15 0519  NA 133* 129* 127* 133*  --  133*  K 5.0 3.4* 3.6 2.6* 3.4* 3.0*  CL 93* 92* 89* 94*  --  93*  CO2 32 27 27 30   --  30  GLUCOSE 78 146* 115* 231*  --  117*  BUN 22* 33* 44* 32*  --  51*  CREATININE 4.13* 4.02* 4.54* 2.81*  --  3.45*  CALCIUM 8.4* 7.6* 7.8* 8.0*  --  8.5*  MG  --  1.6* 1.9 1.8  --   --   PHOS  --  2.2* 2.0* <1.0* 2.0*  --      CBC:  Recent Labs Lab 11/14/15 0053 11/15/15 0549 11/16/15 0453 11/17/15 0507 11/18/15 0519  WBC 3.0* 3.9 9.8 15.2* 21.3*  HGB 11.0* 10.1* 9.3* 9.2* 9.6*  HCT 34.8* 31.7* 28.6* 28.4* 29.5*  MCV 88.5 85.8 83.6 82.8 82.3  PLT 92* 108* 81* 65* 82*      Microbiology:  Recent Results (from the past 720 hour(s))  MRSA PCR Screening      Status: None   Collection Time: 11/13/15  9:46 PM  Result Value Ref Range Status   MRSA by PCR NEGATIVE NEGATIVE Final    Comment:        The GeneXpert MRSA Assay (FDA approved for NASAL specimens only), is one component of a comprehensive MRSA colonization surveillance program. It is not intended to diagnose MRSA infection nor to guide or monitor treatment for MRSA infections.   Culture, blood (Routine X 2) w Reflex to ID Panel     Status: None (Preliminary result)   Collection Time: 11/14/15 12:53 AM  Result Value Ref Range Status   Specimen Description BLOOD RIGHT HAND  Final   Special Requests BOTTLES DRAWN AEROBIC AND ANAEROBIC 5ML  Final   Culture NO GROWTH 4 DAYS  Final   Report Status PENDING  Incomplete  Culture, blood (Routine X 2) w Reflex to ID Panel     Status: None (Preliminary result)   Collection Time: 11/14/15  7:14 AM  Result Value Ref Range Status   Specimen Description BLOOD RIGHT HAND  Final   Special Requests   Final    BOTTLES DRAWN AEROBIC AND ANAEROBIC AER 4ML ANA 2ML   Culture NO GROWTH 4 DAYS  Final   Report Status PENDING  Incomplete  Urine culture     Status: None   Collection Time: 11/14/15 10:16 AM  Result Value Ref Range Status   Specimen Description URINE, RANDOM  Final   Special Requests NONE  Final   Culture NO GROWTH 1 DAY  Final   Report Status 11/15/2015 FINAL  Final  Culture, expectorated sputum-assessment     Status: None   Collection Time: 11/14/15  4:20 PM  Result Value Ref Range Status   Specimen Description SPU  Final   Special Requests NONE  Final   Sputum evaluation THIS SPECIMEN IS ACCEPTABLE FOR SPUTUM CULTURE  Final   Report Status 11/14/2015 FINAL  Final  Culture, respiratory (NON-Expectorated)     Status: None (Preliminary result)   Collection Time: 11/14/15  4:20 PM  Result Value Ref Range Status   Specimen Description SPU  Final   Special Requests NONE Reflexed from [id number]  Final   Gram Stain   Final    MANY WBC  SEEN NO SQUAMOUS EPITHELIAL CELLS PRESENT FEW GRAM POSITIVE COCCI EXCELLENT SPECIMEN - 90-100% WBCS    Culture   Final    LIGHT GROWTH ENTEROBACTER CLOACAE MODERATE GROWTH STREPTOCOCCUS PNEUMONIAE SUSCEPTIBILITIES TO FOLLOW    Report Status PENDING  Incomplete   Organism ID, Bacteria ENTEROBACTER CLOACAE  Final      Susceptibility   Enterobacter cloacae - MIC*    PIP/TAZO Value in next row Sensitive      SENSITIVE8    CEFAZOLIN Value in next row Resistant      RESISTANT>=64    CEFEPIME Value in next row Sensitive      SENSITIVE<=1    CEFTAZIDIME Value in next row Sensitive      SENSITIVE<=1    CEFTRIAXONE Value in next row Sensitive      SENSITIVE<=1    CIPROFLOXACIN Value in next row Sensitive      SENSITIVE<=0.25    GENTAMICIN Value in next row Sensitive      SENSITIVE<=1    IMIPENEM Value in next row Sensitive      SENSITIVE1    TRIMETH/SULFA Value in next row Sensitive      SENSITIVE<=20    * LIGHT GROWTH ENTEROBACTER CLOACAE  C difficile quick scan w PCR reflex     Status: Abnormal   Collection Time: 11/15/15  7:36 AM  Result Value Ref Range Status   C Diff antigen POSITIVE (A) NEGATIVE Final   C Diff toxin NEGATIVE NEGATIVE Final   C Diff interpretation   Final    Negative for toxigenic C. difficile. Toxin gene and active toxin production not detected. May be a nontoxigenic strain of C. difficile bacteria present, lacking the ability to produce toxin.  Clostridium Difficile by PCR     Status: None   Collection Time: 11/15/15  7:36 AM  Result Value Ref Range Status   Toxigenic C Difficile by pcr NEGATIVE NEGATIVE Final    Coagulation Studies: No results for input(s): LABPROT, INR in the last 72 hours.  Urinalysis: No results for input(s): COLORURINE, LABSPEC, PHURINE, GLUCOSEU, HGBUR, BILIRUBINUR, KETONESUR, PROTEINUR, UROBILINOGEN, NITRITE, LEUKOCYTESUR in the last 72 hours.  Invalid input(s): APPERANCEUR    Imaging: Dg Chest 1 View  11/17/2015  CLINICAL  DATA:  55 year old female with shortness of breath and respiratory failure. EXAM:  CHEST 1 VIEW COMPARISON:  11/15/2015 and prior exams FINDINGS: An endotracheal tube, left subclavian central venous catheter with tip overlying the superior cavoatrial junction an NG tube entering stomach again identified. A left subclavian/axillary stent is again noted. New peripheral right upper lobe opacities are identified. Decreased diffuse bilateral airspace opacities noted. Bilateral pleural effusions and bibasilar opacities, left-greater-than-right, again noted. There is no evidence of pneumothorax. IMPRESSION: New peripheral right upper lobe opacities with decrease diffuse bilateral airspace opacities/ edema otherwise. Electronically Signed   By: Margarette Canada M.D.   On: 11/17/2015 07:09   Dg Abd 1 View  11/17/2015  CLINICAL DATA:  Feeding tube placement EXAM: ABDOMEN - 1 VIEW COMPARISON:  11/14/2015 FINDINGS: NG tube is in place with the tip in the mid stomach and the side-port in the proximal to mid stomach. Prior cholecystectomy. IMPRESSION: NG tube tip in the mid stomach. Electronically Signed   By: Rolm Baptise M.D.   On: 11/17/2015 10:57   US Venous Img Upper Uni Right  11/16/2015  CLINICAL DATA:  Right upper extremity edema.  Evaluate for DVT. EXAM: RIGHT UPPER EXTREMITY VENOUS DOPPLER ULTRASOUND TECHNIQUE: Gray-scale sonography with graded compression, as well as color Doppler and duplex ultrasound were performed to evaluate the upper extremity deep venous system from the level of the subclavian vein and including the jugular, axillary, basilic, radial, ulnar and upper cephalic vein. Spectral Doppler was utilized to evaluate flow at rest and with distal augmentation maneuvers. COMPARISON:  None. FINDINGS: Contralateral Subclavian Vein: Respiratory phasicity is normal and symmetric with the symptomatic side. No evidence of thrombus. Normal compressibility. Internal Jugular Vein: No evidence of thrombus. Normal  compressibility, respiratory phasicity and response to augmentation. Subclavian Vein: No evidence of thrombus. Normal compressibility, respiratory phasicity and response to augmentation. Axillary Vein: No evidence of thrombus. Normal compressibility, respiratory phasicity and response to augmentation. Cephalic Vein: Not well visualized secondary to presence of prior dialysis graft. Basilic Vein: Not visualized. Brachial Veins: No evidence of thrombus. Normal compressibility, respiratory phasicity and response to augmentation. Radial Veins: No evidence of thrombus. Normal compressibility, respiratory phasicity and response to augmentation. Ulnar Veins: No evidence of thrombus. Normal compressibility, respiratory phasicity and response to augmentation. Venous Reflux:  None visualized. Other Findings:  None visualized. IMPRESSION: No evidence of DVT within the right upper extremity. Electronically Signed   By: Sandi Mariscal M.D.   On: 11/16/2015 16:06   Dg Chest Port 1 View  11/18/2015  CLINICAL DATA:  Respiratory failure. EXAM: PORTABLE CHEST 1 VIEW COMPARISON:  11/17/2015 FINDINGS: Small bilateral pleural effusions are again noted, stable. Interstitial prominence throughout the lungs. Bilateral perihilar and lower lobe opacities. Findings likely reflect edema/CHF. No real change since prior study. Heart is borderline in size. Support devices are unchanged. IMPRESSION: Probable pulmonary edema/ CHF. Small layering effusions. No real change since prior study. Electronically Signed   By: Rolm Baptise M.D.   On: 11/18/2015 07:36     Medications:   . norepinephrine (LEVOPHED) Adult infusion Stopped (11/18/15 1049)   . acidophilus  2 capsule Oral TID  . [START ON 11/19/2015] amiodarone  200 mg Oral Daily  . antiseptic oral rinse  7 mL Mouth Rinse 10 times per day  . [START ON 11/19/2015] aspirin  81 mg Oral Daily  . atorvastatin  20 mg Oral QPM  . bacitracin   Topical BID  . chlorhexidine gluconate  15 mL  Mouth Rinse BID  . cholecalciferol  400 Units Per Tube Daily  . [START ON  11/19/2015] citalopram  20 mg Oral Daily  . [START ON 11/19/2015] clopidogrel  75 mg Oral Daily  . insulin aspart  0-20 Units Subcutaneous 6 times per day  . ipratropium-albuterol  3 mL Nebulization Q6H  . lipase/protease/amylase  12,000 Units Oral TID WC  . [START ON 11/19/2015] pantoprazole  40 mg Oral Q1200  . vancomycin  125 mg Oral 4 times per day  . [START ON 11/19/2015] vitamin B-12  1,000 mcg Oral Daily   acetaminophen **OR** acetaminophen, albuterol, alum & mag hydroxide-simeth, bisacodyl, dextrose, iohexol, nitroGLYCERIN, senna-docusate  Assessment/ Plan:  55 y.o. 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    [facility] MWF [place]  1. End Stage Renal Disease: MWF - extra HD today (sunday) to assist with Volume removal  2. Anemia of chronic kidney disease:  - hemoglobin currently  9.6 - Avoid EPO until patient sees oncologist as masses c/w RCC seen on CT abdomen on 2/20  3.  Acute resp failure - combination of Pneumonia, Anasarca, large pleural effusions -  Vent assisted   4.  Hypokalemia - replace prn  5. Gastroparesis, Nausea and vomiting - symptomatic management      LOS:  [name] 2/26/201711:49 AM

## 2015-11-18 NOTE — Progress Notes (Signed)
2115 cbg is 75; nursing to continue to monitor.

## 2015-11-18 NOTE — Progress Notes (Signed)
Pt. ectubated to 2 lpm [place]..sats 95%  fair cough effort

## 2015-11-18 NOTE — Progress Notes (Signed)
[facility] NAME: [name]    MR#:  [id number]  DATE OF BIRTH:  02-14-1961  CHIEF COMPLAINT:   Chief Complaint  Patient presents with  . Nausea  . Emesis    Patient remains on the ventilator, received 2 doses of sedation yesterday when necessary because she was trying to pull her ET tube. According to the nurse she has been able to follow commands earlier today. When I entered the room she was tracking me but then became a little sleepy.    REVIEW OF SYSTEMS:    Review of Systems  Unable to perform ROS: Due to being intubated      DRUG ALLERGIES:   Allergies  Allergen Reactions  . Compazine [Prochlorperazine Edisylate] Anaphylaxis and Nausea And Vomiting  . Ace Inhibitors Swelling  . Ativan [Lorazepam] Other (See Comments)    Reaction:  Hallucinations and headaches  . Codeine Nausea And Vomiting  . Gabapentin Other (See Comments)    Reaction:  Unknown   . Losartan Other (See Comments)    Reaction:  Unknown   . Ondansetron Other (See Comments)    Reaction:  Unknown   . Prochlorperazine Other (See Comments)    Reaction:  Unknown   . Scopolamine Other (See Comments)    Reaction:  Unknown   . Zofran [Ondansetron Hcl] Other (See Comments)    Reaction:  hallucinations   . Oxycodone Anxiety  . Tape Rash    VITALS:  Blood pressure 139/51, pulse 66, temperature 97.9 F (36.6 C), temperature source Axillary, resp. rate 19, height 5\' 5"  (1.651 m), weight 75.9 kg (167 lb 5.3 oz), SpO2 100 %.  PHYSICAL EXAMINATION:   Physical Exam  GENERAL:  55 y.o.-year-old patient lying in the bed  intubated, critically ill female EYES: Pupils equal, round, reactive to light and accommodation. No scleral icterus. HEENT: Head atraumatic, normocephalic. ET tube in place  NECK:  Supple, no jugular venous distention. No thyroid enlargement, no tenderness.  LUNGS: , On a mechanical ventilator diminished breath sounds  bilaterally without any rales rhonchi wheezing  CARDIOVASCULAR: S1, S2 normal. No murmurs, rubs, or gallops.  ABDOMEN: Soft, nontender, nondistended. Bowel sounds present. No organomegaly or mass.  EXTREMITIES: Right hand edema. NEUROLOGIC:  Currently intubated eyes open  was able to track me. PSYCHIATRIC:lntubated  not anxious  SKIN: No obvious rash, lesion, or ulcer.    LABORATORY PANEL:   CBC  Recent Labs Lab 11/18/15 0519  WBC 21.3*  HGB 9.6*  HCT 29.5*  PLT 82*   ------------------------------------------------------------------------------------------------------------------  Chemistries   Recent Labs Lab 11/13/15 1425  11/17/15 0507  11/18/15 0519  NA 133*  < > 133*  --  133*  K 5.0  < > 2.6*  < > 3.0*  CL 93*  < > 94*  --  93*  CO2 32  < > 30  --  30  GLUCOSE 78  < > 231*  --  117*  BUN 22*  < > 32*  --  51*  CREATININE 4.13*  < > 2.81*  --  3.45*  CALCIUM 8.4*  < > 8.0*  --  8.5*  MG  --   < > 1.8  --   --   AST 20  --   --   --   --   ALT 11*  --   --   --   --   ALKPHOS 144*  --   --   --   --  BILITOT 1.1  --   --   --   --   < > = values in this interval not displayed. ------------------------------------------------------------------------------------------------------------------  Cardiac Enzymes  Recent Labs Lab 11/12/15 0622  TROPONINI <0.03   ------------------------------------------------------------------------------------------------------------------  RADIOLOGY:  Dg Chest 1 View  11/17/2015  CLINICAL DATA:  55 year old female with shortness of breath and respiratory failure. EXAM: CHEST 1 VIEW COMPARISON:  11/15/2015 and prior exams FINDINGS: An endotracheal tube, left subclavian central venous catheter with tip overlying the superior cavoatrial junction an NG tube entering stomach again identified. A left subclavian/axillary stent is again noted. New peripheral right upper lobe opacities are identified. Decreased diffuse bilateral  airspace opacities noted. Bilateral pleural effusions and bibasilar opacities, left-greater-than-right, again noted. There is no evidence of pneumothorax. IMPRESSION: New peripheral right upper lobe opacities with decrease diffuse bilateral airspace opacities/ edema otherwise. Electronically Signed   By: Margarette Canada M.D.   On: 11/17/2015 07:09   Dg Abd 1 View  11/17/2015  CLINICAL DATA:  Feeding tube placement EXAM: ABDOMEN - 1 VIEW COMPARISON:  11/14/2015 FINDINGS: NG tube is in place with the tip in the mid stomach and the side-port in the proximal to mid stomach. Prior cholecystectomy. IMPRESSION: NG tube tip in the mid stomach. Electronically Signed   By: Rolm Baptise M.D.   On: 11/17/2015 10:57   US Venous Img Upper Uni Right  11/16/2015  CLINICAL DATA:  Right upper extremity edema.  Evaluate for DVT. EXAM: RIGHT UPPER EXTREMITY VENOUS DOPPLER ULTRASOUND TECHNIQUE: Gray-scale sonography with graded compression, as well as color Doppler and duplex ultrasound were performed to evaluate the upper extremity deep venous system from the level of the subclavian vein and including the jugular, axillary, basilic, radial, ulnar and upper cephalic vein. Spectral Doppler was utilized to evaluate flow at rest and with distal augmentation maneuvers. COMPARISON:  None. FINDINGS: Contralateral Subclavian Vein: Respiratory phasicity is normal and symmetric with the symptomatic side. No evidence of thrombus. Normal compressibility. Internal Jugular Vein: No evidence of thrombus. Normal compressibility, respiratory phasicity and response to augmentation. Subclavian Vein: No evidence of thrombus. Normal compressibility, respiratory phasicity and response to augmentation. Axillary Vein: No evidence of thrombus. Normal compressibility, respiratory phasicity and response to augmentation. Cephalic Vein: Not well visualized secondary to presence of prior dialysis graft. Basilic Vein: Not visualized. Brachial Veins: No evidence of  thrombus. Normal compressibility, respiratory phasicity and response to augmentation. Radial Veins: No evidence of thrombus. Normal compressibility, respiratory phasicity and response to augmentation. Ulnar Veins: No evidence of thrombus. Normal compressibility, respiratory phasicity and response to augmentation. Venous Reflux:  None visualized. Other Findings:  None visualized. IMPRESSION: No evidence of DVT within the right upper extremity. Electronically Signed   By: Sandi Mariscal M.D.   On: 11/16/2015 16:06   Dg Chest Port 1 View  11/18/2015  CLINICAL DATA:  Respiratory failure. EXAM: PORTABLE CHEST 1 VIEW COMPARISON:  11/17/2015 FINDINGS: Small bilateral pleural effusions are again noted, stable. Interstitial prominence throughout the lungs. Bilateral perihilar and lower lobe opacities. Findings likely reflect edema/CHF. No real change since prior study. Heart is borderline in size. Support devices are unchanged. IMPRESSION: Probable pulmonary edema/ CHF. Small layering effusions. No real change since prior study. Electronically Signed   By: Rolm Baptise M.D.   On: 11/18/2015 07:36     ASSESSMENT AND PLAN:     1. Acute respiratory failure secondary to pneumonia, pulmonary edema: Continue full vent support, fluid removal per nephrology Continue by mouth vancomycin, Zosyn. Weaning trials  then extubation if tolerates  #2. Septic shock: Secondary to C. difficile, pneumonia: Continue Levophed antibiotics, continue vancomycin orally and and Unasyn WBC trending up again. Suspect due to steroids and hydrocortisone therapy.  #3 severe electrolyte imbalance ; currently being replaced per pharmacy consult  #4 gastroparesis secondary to diabetes mellitus type 2: Continue Phenergan, Reglan.  #5 Diabetes mellitus type 2 with hypoglycemia:  hypoglycemia improved. Hypoglycemia due to sepsis. Currently off D10. Continue blood sugar.  Appreciate endocrinology input Hypoglycemia likely related to sepsis  #6  acute on chronic diastolic heart failure in setting of fall ESRD: Continue dialysis as per nephrology recommendation  #7 Coronary artery disease patient is on Plavix, Coreg, amiodarone continue  #8 chronic pancreatitis: Continue Creon  #9 right arm edema, exclude DVT: No evidence of DVT  #10 thrombocytopenia:  Platelet count trending upwards now  #11. Check recurrent C. difficile, status post stool transplant: Continue isolation because of her positive C. difficile antigen     Prognosis very poor  All the records are reviewed and case discussed with Care Management/Social Workerr. Management plans discussed with the patient, family and they are in agreement.  CODE STATUS:full  TOTAL TIME TAKING CARE OF THIS PATIENT:  35 minutes of critical care  condition guarded with multiple issues high risk of cardiopulmonary arrest   Dustin Flock M.D on 11/18/2015 at 9:12 AM  Between 7am to 6pm - Pager - [phone]  After 6pm go to [email or web address] - password [email or web address] [facility]  Office  [phone]  CC: Primary care physician; Ellamae Sia, MD

## 2015-11-18 NOTE — Progress Notes (Signed)
PULMONARY / CRITICAL CARE MEDICINE   Name: [name] MRN: [id number] DOB: 07-May-1961    ADMISSION DATE:  11/12/2015   CONSULTATION DATE:  11/14/15  REFERRING MD:  Dr Vianne Bulls  CHIEF COMPLAINT: Hyperkalemia, nausea, vomiting and altered mental status  HISTORY OF PRESENT ILLNESS:   This is a 55 yo AA female with a PMH of ESRD on dialysis, type 2 DM, hypertension, COPD, CHF and CAD admitted with hyperkalemia, dyspnea, nausea, and vomiting. She had emergency hemodialysis on 02/21 for a potassium level of 5.9 with 3L of fluid removed.Her CXR showed bilateral pleural effusions and bilateral lower lobe opacities. Last night(02/21) she became hypoxic, less responsive, hypoglycemic and febrile. She was transferred to the ICU and started on BiPAP. This morning patient became unresponsive and hypotensive necessitating emergent intubation. PCCM was consulted for further management.     SUBJECTIVE:  Off norepi yesterday but restarted in early morning. RASS 0, + F/C. Passed SBT   VITAL SIGNS: BP 139/51 mmHg  Pulse 66  Temp(Src) 97.9 F (36.6 C) (Axillary)  Resp 19  Ht 5\' 5"  (1.651 m)  Wt 167 lb 5.3 oz (75.9 kg)  BMI 27.85 kg/m2  SpO2 100%  HEMODYNAMICS:   VENTILATOR SETTINGS: Vent Mode:  [-] PSV FiO2 (%):  [28 %] 28 % PEEP:  [5 cmH20] 5 cmH20 Pressure Support:  [19 cmH20] 19 cmH20  INTAKE / OUTPUT:    PHYSICAL EXAMINATION: General: RASS 0, + F/C Neuro: no focal deficits HEENT: WNL Cardiovascular:  RRR, no M Lungs: clear anteriorly. Diminished BS in R base Abdomen: Soft, NT/ND, normal bowel sounds Ext: no edema  BMET  Recent Labs Lab 11/16/15 0453 11/17/15 0507 11/17/15 1754 11/18/15 0519  NA 127* 133*  --  133*  K 3.6 2.6* 3.4* 3.0*  CL 89* 94*  --  93*  CO2 27 30  --  30  BUN 44* 32*  --  51*  CREATININE 4.54* 2.81*  --  3.45*  GLUCOSE 115* 231*  --  117*    Electrolytes  Recent Labs Lab 11/15/15 0549 11/16/15 0453 11/17/15 0507 11/17/15 1754  11/18/15 0519  CALCIUM 7.6* 7.8* 8.0*  --  8.5*  MG 1.6* 1.9 1.8  --   --   PHOS 2.2* 2.0* <1.0* 2.0*  --     CBC  Recent Labs Lab 11/16/15 0453 11/17/15 0507 11/18/15 0519  WBC 9.8 15.2* 21.3*  HGB 9.3* 9.2* 9.6*  HCT 28.6* 28.4* 29.5*  PLT 81* 65* 82*    Coag's No results for input(s): APTT, INR in the last 168 hours.  Sepsis Markers No results for input(s): LATICACIDVEN, PROCALCITON, O2SATVEN in the last 168 hours.  ABG  Recent Labs Lab 11/13/15 2118 11/14/15 1415 11/17/15 0500  PHART 7.44 7.48* 7.47*  PCO2ART 49* 40 43  PO2ART 73* 65* 77*    Liver Enzymes  Recent Labs Lab 11/12/15 0622 11/13/15 0744 11/13/15 1425  AST 18 25 20   ALT 12* 13* 11*  ALKPHOS 125 137* 144*  BILITOT 1.0 0.9 1.1  ALBUMIN 3.2* 3.0* 3.1*    Cardiac Enzymes  Recent Labs Lab 11/12/15 0622  TROPONINI <0.03    Glucose  Recent Labs Lab 11/17/15 1538 11/17/15 1957 11/18/15 0002 11/18/15 0414 11/18/15 0735 11/18/15 1140  GLUCAP 200* 180* 143* 149* 116* 254*    CXR: Cardiomegaly, mild interstitial edema with small bilateral effusions    STUDIES:    CULTURES: Blood 02/22>NTD Urine 02/22>Negative Sputum 02/22>few GPC C-dif antigen 02/23>Positive; toxin negative  ANTIBIOTICS:  Zosyn 02/22>>02/25 Vancomycin 02/22>>02/25 Unasyn 02/25 >> 02/26 Enteral vanco 02/23 >>   SIGNIFICANT EVENTS: 02/20>Admitted 02/21>Transferred to ICU 02/22>Intubated  LINES/TUBES:  Left subclavian TLC 02/22 >>  ETT 02/22 >> 02/26   PULMONARY A: Acute hypoxic respiratory failure secondary to pneumonia and/or pulmonary edema H/O COPD P:  Extubate 02/26 Supplemental O2 post extubation Monitor in ICU post extubation  CARDIOVASCULAR A:  Shock-resolved Systolic and diastolic heart failure H/o CAD H/o Hypertension P:  MAP goal > 55 mmHg Holding antihypertensive meds Continue plavix  RENAL A:  ESRD on dialysis Hypokalemia   Hypophosphatemia Hypomagnesemia Hyponatremia P:  Monitor BMET intermittently Monitor I/Os Correct electrolytes as indicated HD per renal service (MWF)  GASTROINTESTINAL A:  Nausea/vomiting-Resolved Post extubation dysphagia Chronic PPI use P:  SUP: PO PPI NPO post extubation SLP eval ordered for 02/27 AM  HEMATOLOGIC A:  Anemia of chronic disease Thrombocytopenia,heparin induced Ab within normal range P:  DVT px: SQ heparin Monitor CBC intermittently Transfuse per usual guidelines  INFECTIOUS A:  Severe sepsis Suspect aspiration pneumonia - appears fully treated C-Diff antigen positive P:  Monitor temp, WBC count Micro and abx as above  ENDOCRINE A:  Hypoglycemia, resolved DM 2 Risk of recurrent hypoglycemia with TFs on hold P:  Change SSI to moderate scale Once diet is initiated - will need to change SSI to ACHS  NEUROLOGIC A:  Acute encephalopathy, improving P:  RASS goal: 0 Minimize sedating meds   CCM time: 35 mins  The above time includes time spent in consultation with patient and/or family members and reviewing care plan on multidisciplinary rounds and also includes rechecks post-extubation  Merton Border, MD PCCM service Mobile [phone] Pager [phone]     11/18/2015, 12:55 PM

## 2015-11-18 NOTE — Progress Notes (Signed)
PT extubated by RT.  OG removed.  MD simonds at bedside.

## 2015-11-18 NOTE — Progress Notes (Signed)
Patient cbg was 71; recheck 69; hypoglycemia protocol initiated. Nursing to continue monitoring.

## 2015-11-19 DIAGNOSIS — J9601 Acute respiratory failure with hypoxia: Secondary | ICD-10-CM

## 2015-11-19 DIAGNOSIS — N179 Acute kidney failure, unspecified: Secondary | ICD-10-CM | POA: Insufficient documentation

## 2015-11-19 DIAGNOSIS — J9690 Respiratory failure, unspecified, unspecified whether with hypoxia or hypercapnia: Secondary | ICD-10-CM | POA: Insufficient documentation

## 2015-11-19 LAB — CBC
HCT: 29.3 % — ABNORMAL LOW (ref 35.0–47.0)
Hemoglobin: 9.4 g/dL — ABNORMAL LOW (ref 12.0–16.0)
MCH: 26.8 pg (ref 26.0–34.0)
MCHC: 32.2 g/dL (ref 32.0–36.0)
MCV: 83.2 fL (ref 80.0–100.0)
Platelets: 90 10*3/uL — ABNORMAL LOW (ref 150–440)
RBC: 3.52 MIL/uL — ABNORMAL LOW (ref 3.80–5.20)
RDW: 20 % — ABNORMAL HIGH (ref 11.5–14.5)
WBC: 25.5 10*3/uL — ABNORMAL HIGH (ref 3.6–11.0)

## 2015-11-19 LAB — GLUCOSE, CAPILLARY
Glucose-Capillary: 104 mg/dL — ABNORMAL HIGH (ref 65–99)
Glucose-Capillary: 132 mg/dL — ABNORMAL HIGH (ref 65–99)
Glucose-Capillary: 137 mg/dL — ABNORMAL HIGH (ref 65–99)
Glucose-Capillary: 145 mg/dL — ABNORMAL HIGH (ref 65–99)
Glucose-Capillary: 150 mg/dL — ABNORMAL HIGH (ref 65–99)
Glucose-Capillary: 63 mg/dL — ABNORMAL LOW (ref 65–99)
Glucose-Capillary: 90 mg/dL (ref 65–99)

## 2015-11-19 LAB — BASIC METABOLIC PANEL WITH GFR
Anion gap: 8 (ref 5–15)
BUN: 29 mg/dL — ABNORMAL HIGH (ref 6–20)
CO2: 33 mmol/L — ABNORMAL HIGH (ref 22–32)
Calcium: 8.5 mg/dL — ABNORMAL LOW (ref 8.9–10.3)
Chloride: 97 mmol/L — ABNORMAL LOW (ref 101–111)
Creatinine, Ser: 2.29 mg/dL — ABNORMAL HIGH (ref 0.44–1.00)
GFR calc Af Amer: 26 mL/min — ABNORMAL LOW
GFR calc non Af Amer: 23 mL/min — ABNORMAL LOW
Glucose, Bld: 134 mg/dL — ABNORMAL HIGH (ref 65–99)
Potassium: 3.3 mmol/L — ABNORMAL LOW (ref 3.5–5.1)
Sodium: 138 mmol/L (ref 135–145)

## 2015-11-19 MED ORDER — POTASSIUM CHLORIDE CRYS ER 20 MEQ PO TBCR
40.0000 meq | EXTENDED_RELEASE_TABLET | Freq: Once | ORAL | Status: DC
  Filled 2015-11-19: qty 2

## 2015-11-19 MED ORDER — CHLORHEXIDINE GLUCONATE CLOTH 2 % EX PADS: 6.0000 | MEDICATED_PAD | Freq: Every day | CUTANEOUS | Status: DC

## 2015-11-19 MED ORDER — MUPIROCIN 2 % EX OINT
1.0000 "application " | TOPICAL_OINTMENT | Freq: Two times a day (BID) | CUTANEOUS | Status: DC
  Filled 2015-11-19: qty 22

## 2015-11-19 MED ORDER — DOCUSATE SODIUM 100 MG PO CAPS: 200.0000 mg | ORAL_CAPSULE | Freq: Two times a day (BID) | ORAL | Status: DC

## 2015-11-19 MED ORDER — SENNOSIDES-DOCUSATE SODIUM 8.6-50 MG PO TABS: 1.0000 | ORAL_TABLET | Freq: Two times a day (BID) | ORAL | Status: DC

## 2015-11-19 MED ORDER — IPRATROPIUM-ALBUTEROL 0.5-2.5 (3) MG/3ML IN SOLN
3.0000 mL | Freq: Three times a day (TID) | RESPIRATORY_TRACT | Status: DC
  Administered 2015-11-20 – 2015-11-23 (×11): 3 mL via RESPIRATORY_TRACT
  Filled 2015-11-19 (×12): qty 3

## 2015-11-19 NOTE — Progress Notes (Signed)
PRE HD   

## 2015-11-19 NOTE — Progress Notes (Signed)
0012 CBG 90; WILL CONTNINUE TO MONITOR.

## 2015-11-19 NOTE — Evaluation (Signed)
Clinical/Bedside Swallow Evaluation Patient Details  Name: [name] MRN: [id number] Date of Birth: 1960/09/29  Today's Date: 11/19/2015 Time: SLP Start Time (ACUTE ONLY): 1600 SLP Stop Time (ACUTE ONLY): 1650 SLP Time Calculation (min) (ACUTE ONLY): 50 min  Past Medical History:  Past Medical History  Diagnosis Date  . Asthma   . Collagen vascular disease ([place])   . Diabetes mellitus without complication ([place])   . Hypertension   . Coronary artery disease     a. cath 2013: stenting to RCA (report not available); b. cath 2014: LM nl, pLAD 40%, mLAD nl, ost LCx 40%, mid LCx nl, pRCA 30% @ site of prior stent, mRCA 50%  . Diabetic neuropathy ([place])   . ESRD (end stage renal disease) on dialysis ([place])     M-W-F  . GERD (gastroesophageal reflux disease)   . Hx of pancreatitis 2015  . Myocardial infarction ([place])   . Pneumonia   . Mitral regurgitation     a. echo 10/2013: EF 62%, noWMA, mildly dilated LA, mild to mod MR/TR, GR1DD  . COPD (chronic obstructive pulmonary disease) ([place])   . chronic diastolic CHF [id number]  . dialysis 2006  . Depression   . Anxiety   . Arthritis   . Headache   . Anemia   . Renal insufficiency    Past Surgical History:  Past Surgical History  Procedure Laterality Date  . Cholecystectomy    . Appendectomy    . Abdominal hysterectomy    . Dialysis fistula creation Left     upper arm  . Esophagogastroduodenoscopy N/A 03/08/2015    Procedure: ESOPHAGOGASTRODUODENOSCOPY (EGD);  Surgeon: Manya Silvas, MD;  Location: [facility] ENDOSCOPY;  Service: Endoscopy;  Laterality: N/A;  . Cardiac catheterization Left 07/26/2015    Procedure: Left Heart Cath and Coronary Angiography;  Surgeon: Dionisio David, MD;  Location: [facility] CV LAB;  Service: Cardiovascular;  Laterality: Left;  Marland Kitchen Eye surgery    . Fecal transplant N/A 08/23/2015    Procedure: FECAL TRANSPLANT;  Surgeon: Manya Silvas, MD;  Location: [facility] ENDOSCOPY;  Service: Endoscopy;  Laterality: N/A;    HPI:  Pt is a 55 y.o. female with a known history of ESRD, CAD and Asacol failure who presents with abdominal pain, nausea and vomiting for the past day. Patient is also complaining of shortness of breath. Patient did not go for dialysis today because of abdominal pain. CT of the abdomen ordered in the emergency room showed no acute pathology. Patient reports that her granddaughters have been sick.    Assessment / Plan / Recommendation Clinical Impression  Pt appears at reduced risk for aspiration w/ a modified, Dys. 1 diet w/ Nectar consistency liquids; aspiration precautions; meds in puree and monitoring during meals.     Aspiration Risk  Mild aspiration risk (-moderate aspiration risk currently)    Diet Recommendation  Dys. 1 w/ Nectar liquids; aspiration precautions; monitoring during meals d/t Cognitive status  Medication Administration: Crushed with puree (whole in puree as able)    Other  Recommendations Recommended Consults:  (Dietician) Oral Care Recommendations: Oral care BID;Staff/trained caregiver to provide oral care Other Recommendations: Order thickener from pharmacy;Prohibited food (jello, ice cream, thin soups);Remove water pitcher   Follow up Recommendations  Skilled Nursing facility (TBD)    Frequency and Duration min 3x week  2 weeks       Prognosis Prognosis for Safe Diet Advancement: Fair Barriers to Reach Goals: Cognitive deficits      Swallow Study  General Date of Onset: 11/12/15 HPI: Pt is a 55 y.o. female with a known history of ESRD, CAD and Asacol failure who presents with abdominal pain, nausea and vomiting for the past day. Patient is also complaining of shortness of breath. Patient did not go for dialysis today because of abdominal pain. CT of the abdomen ordered in the emergency room showed no acute pathology. Patient reports that her granddaughters have been sick.  Type of Study: Bedside Swallow Evaluation Previous Swallow Assessment: BSE last  admission Diet Prior to this Study: Dysphagia 3 (soft);Thin liquids (rec'd previous admission) Temperature Spikes Noted:  (temps 94-97; wbc 25.5) Respiratory Status: Nasal cannula (2 liters) History of Recent Intubation: Yes Length of Intubations (days): 5 days Date extubated: 11/18/15 Behavior/Cognition: Alert;Cooperative;Pleasant mood;Confused;Distractible;Requires cueing Oral Cavity Assessment: Within Functional Limits;Dry Oral Care Completed by SLP: Recent completion by staff Oral Cavity - Dentition: Edentulous (has dentures but not in) Vision: Functional for self-feeding Self-Feeding Abilities: Able to feed self;Needs assist;Needs set up Patient Positioning: Upright in bed Baseline Vocal Quality: Normal Volitional Cough: Strong Volitional Swallow: Able to elicit    Oral/Motor/Sensory Function Overall Oral Motor/Sensory Function: Within functional limits   Ice Chips Ice chips: Impaired Presentation: Spoon (fed; 2 trials) Oral Phase Impairments:  (none) Oral Phase Functional Implications:  (none) Pharyngeal Phase Impairments: Cough - Immediate (x2/2 trials)   Thin Liquid Thin Liquid: Not tested    Nectar Thick Nectar Thick Liquid: Within functional limits Presentation: Cup;Self Fed;Straw (~9-10 trials)   Honey Thick Honey Thick Liquid: Within functional limits Presentation: Cup;Self fed;Straw (3 trials)   Puree Puree: Within functional limits Presentation: Spoon;Self Fed (10 trials total)   Solid   GO   Solid: Not tested       [name], MS, CCC-SLP  [name],[name] 11/19/2015,4:53 PM

## 2015-11-19 NOTE — Progress Notes (Signed)
Nutrition Follow-up    INTERVENTION:   Meals and Snacks: Cater to patient preferences Medical Food Supplement Therapy: on previous admissions, pt reports she does not like Nepro and reports it gives her diarrhea; pt does like Mighty Shakes; will send TID on meal trays   NUTRITION DIAGNOSIS:   Inadequate oral intake related to altered GI function as evidenced by per patient/family report.  GOAL:   Patient will meet greater than or equal to 90% of their needs  MONITOR:    (Energy intake, Digestive system)  REASON FOR ASSESSMENT:   Consult Enteral/tube feeding initiation and management  ASSESSMENT:    Pt s/p extubation on 2/26, received HD yesterday with plan for HD again today for additional volume removal  Diet Order:  Diet renal with fluid restriction Fluid restriction:: 1200 mL Fluid; Room service appropriate?: Yes; Fluid consistency:: Thin   Energy Intake: diet advanced this AM post extubation, no recorded po intake yet   Recent Labs Lab 11/15/15 0549 11/16/15 0453 11/17/15 0507 11/17/15 1754 11/18/15 0519 11/19/15 0529  NA 129* 127* 133*  --  133* 138  K 3.4* 3.6 2.6* 3.4* 3.0* 3.3*  CL 92* 89* 94*  --  93* 97*  CO2 27 27 30   --  30 33*  BUN 33* 44* 32*  --  51* 29*  CREATININE 4.02* 4.54* 2.81*  --  3.45* 2.29*  CALCIUM 7.6* 7.8* 8.0*  --  8.5* 8.5*  MG 1.6* 1.9 1.8  --   --   --   PHOS 2.2* 2.0* <1.0* 2.0*  --   --   GLUCOSE 146* 115* 231*  --  117* 134*    Glucose Profile:  Recent Labs  11/19/15 0412 11/19/15 0803 11/19/15 1219  GLUCAP 104* 132* 137*   Meds: ss novolog, creon, acidophilus, potassium chloride  Height:   Ht Readings from Last 1 Encounters:  11/12/15 5\' 5"  (1.651 m)    Weight:   Wt Readings from Last 1 Encounters:  11/18/15 161 lb 6 oz (73.2 kg)   Filed Weights   11/18/15 0428 11/18/15 1750 11/18/15 2145  Weight: 167 lb 5.3 oz (75.9 kg) 166 lb 0.1 oz (75.3 kg) 161 lb 6 oz (73.2 kg)    BMI:  Body mass index is 26.85  kg/(m^2).  Estimated Nutritional Needs:   Kcal:  1887 kcals (Ve: 9.1, Tmax: 39.2) using wt of 73 kg  Protein:  110-146 g(1.5-2.0 g/kg)   Fluid:  (1070ml + UOP)  EDUCATION NEEDS:   No education needs identified at this time  HIGH Care Level  Kerman Passey MS, RD, LDN [phone] Pager  [phone] Weekend/On-Call Pager

## 2015-11-19 NOTE — Evaluation (Signed)
Physical Therapy Evaluation Patient Details Name: [name] MRN: [id number] DOB: 1961-06-19 Today's Date: 11/19/2015   History of Present Illness  [name] is a 55 y.o. female with a known history of ESRD, CAD and Asacol failure who presents with abdominal pain, nausea and vomiting for the past day. Patient is also complaining of shortness of breath. Patient did not go for dialysis today because of abdominal pain. CT of the abdomen ordered in the emergency room showed no acute pathology. Patient reports that her granddaughters have been sick. Pt is AOx3 at time of evaluation but disoriented to situation. Poor short term memory and unable to provide extensive details regarding prior function a living situation. Significant portion of history obtained from medical record  Clinical Impression  Pt demonstrates confusion during evaluation which limits her ability to provide full history or complete all testing with therapist. However she is able to follow commands appropriately for bed exercises and bed mobility. Attempted transfer with maxA+1 and pt is unable to fully come to standing with therapist. Unable to attempt ambulation at this time due to weakness. Pt will need SNF placement at discharge due to severe weakness. Pt will benefit from skilled PT services to address deficits in strength, balance, and mobility in order to return to full function at home.     Follow Up Recommendations SNF    Equipment Recommendations  None recommended by PT    Recommendations for Other Services Rehab consult     Precautions / Restrictions Precautions Precautions: Fall Restrictions Weight Bearing Restrictions: No      Mobility  Bed Mobility Overal bed mobility: Needs Assistance Bed Mobility: Supine to Sit;Sit to Supine     Supine to sit: Mod assist Sit to supine: Mod assist   General bed mobility comments: Pt with poor command follow and generalized weakness noted. Once transferred up to sit of  bed she leans to the L but is eventually able to correct and remain upright without assistanc  Transfers Overall transfer level: Needs assistance Equipment used: Rolling walker (2 wheeled) Transfers: Sit to/from Stand Sit to Stand: Max assist         General transfer comment: Attempted sit to stand transfer with patient. MaxA+1 for sit to stand and pt unable to come to full upright standing. Pt is extremely weak in bilateral LE. Poor standing balance.   Ambulation/Gait             General Gait Details: Pt unable to come to standing and unable to ambulate at this time due to weakness  Stairs            Wheelchair Mobility    Modified Rankin (Stroke Patients Only)       Balance Overall balance assessment: Needs assistance Sitting-balance support: No upper extremity supported Sitting balance-Leahy Scale: Fair Sitting balance - Comments: Pt able to maintain upright sitting balance without assist. Tolerates minimal challenge to balance. Poor cognition     Standing balance-Leahy Scale: Zero                               Pertinent Vitals/Pain Pain Assessment: Faces Faces Pain Scale: Hurts a little bit Pain Location: Pt reports bilateral LE pain but unable to elaborate or rate. Pt only demonstrates some minimal discomfort during bed mobility    Home Living Family/patient expects to be discharged to:: Private residence Living Arrangements: Children Available Help at Discharge: Available PRN/intermittently;Family Type of Home:  Apartment Home Access: Level entry     Home Layout: Two level;Able to live on main level with bedroom/bathroom Home Equipment: Wheelchair - [employer] - 2 wheels;Cane - single point;Bedside commode      Prior Function Level of Independence: Needs assistance         Comments: Pt unable to provide information due to confusion. Reports she is independent with ADLs but requires assist for IADLs. During prior admission in  January pt reported she was independent with assistive device. Pt does confirm that she uses rolling walker for ambulation     Hand Dominance        Extremity/Trunk Assessment   Upper Extremity Assessment: Generalized weakness;Difficult to assess due to impaired cognition           Lower Extremity Assessment: Generalized weakness;Difficult to assess due to impaired cognition (Does not follow MMT commands adequately)         Communication   Communication: No difficulties  Cognition Arousal/Alertness: Awake/alert Behavior During Therapy: Flat affect Overall Cognitive Status: No family/caregiver present to determine baseline cognitive functioning Area of Impairment: Orientation Orientation Level: Disoriented to;Situation             General Comments: Unable to provide name of president. Unable to provide extensive details regarding living situation or prior function    General Comments      Exercises General Exercises - Lower Extremity Ankle Circles/Pumps: Strengthening;Both;10 reps;Supine Quad Sets: Strengthening;Both;10 reps;Supine Gluteal Sets: Strengthening;Both;10 reps;Supine Heel Slides: Strengthening;Both;10 reps;Supine Hip ABduction/ADduction: Strengthening;Both;10 reps;Supine Straight Leg Raises: Strengthening;Both;10 reps;Supine      Assessment/Plan    PT Assessment Patient needs continued PT services  PT Diagnosis Difficulty walking;Abnormality of gait;Generalized weakness   PT Problem List Decreased strength;Decreased activity tolerance;Decreased balance;Decreased mobility;Decreased cognition;Decreased knowledge of use of DME;Decreased safety awareness;Decreased knowledge of precautions  PT Treatment Interventions DME instruction;Gait training;Stair training;Therapeutic activities;Functional mobility training;Therapeutic exercise;Balance training;Neuromuscular re-education;Cognitive remediation;Patient/family education   PT Goals (Current goals can be  found in the Care Plan section) Acute Rehab PT Goals PT Goal Formulation: Patient unable to participate in goal setting Potential to Achieve Goals: Fair    Frequency Min 2X/week   Barriers to discharge        Co-evaluation               End of Session Equipment Utilized During Treatment: Gait belt Activity Tolerance: Patient tolerated treatment well Patient left: in bed;with call bell/phone within reach;with bed alarm set Nurse Communication: Mobility status;Other (comment) (CNA notified need to clean-up BM)         Time: [id number] PT Time Calculation (min) (ACUTE ONLY): 22 min   Charges:   PT Evaluation $PT Eval Low Complexity: 1 Procedure PT Treatments $Therapeutic Exercise: 8-22 mins   PT G Codes:       Lyndel Safe Alishea Beaudin PT, DPT   Karmel Patricelli 11/19/2015, 12:01 PM

## 2015-11-19 NOTE — Progress Notes (Signed)
Notified Dr. Dustin Flock that I am not going to administer patient's morning meds until after the speech evaluation.  He acknowledged.

## 2015-11-19 NOTE — Progress Notes (Signed)
PULMONARY / CRITICAL CARE MEDICINE   Name: [name] MRN: [id number] DOB: [birth year]    ADMISSION DATE:  11/12/2015   CONSULTATION DATE:  11/14/15  REFERRING MD:  Dr Vianne Bulls  CHIEF COMPLAINT: Hyperkalemia, nausea, vomiting and altered mental status  HISTORY OF PRESENT ILLNESS:   This is a 55 yo AA female with a PMH of ESRD on dialysis, type 2 DM, hypertension, COPD, CHF and CAD admitted with hyperkalemia, dyspnea, nausea, and vomiting. She had emergency hemodialysis on 02/21 for a potassium level of 5.9 with 3L of fluid removed.Her CXR showed bilateral pleural effusions and bilateral lower lobe opacities. On 02/21 she became hypoxic, less responsive, hypoglycemic and febrile. She was transferred to the ICU and started on BiPAP. This morning patient became unresponsive and hypotensive necessitating emergent intubation. PCCM was consulted for further management.     SUBJECTIVE:  S/p extubation yesterday, alert and awake this AM Follows commands, on RA, off vasopressors  VITAL SIGNS: BP 144/59 mmHg  Pulse 63  Temp(Src) 94.7 F (34.8 C) (Axillary)  Resp 19  Ht 5\' 5"  (1.651 m)  Wt 161 lb 6 oz (73.2 kg)  BMI 26.85 kg/m2  SpO2 100%  HEMODYNAMICS:   VENTILATOR SETTINGS: Vent Mode:  [-] PSV FiO2 (%):  [28 %] 28 % PEEP:  [5 cmH20] 5 cmH20 Pressure Support:  [19 cmH20] 19 cmH20  INTAKE / OUTPUT: I/O last 3 completed shifts: In: -  Out: 2000 [Other:2000]  Review of Systems  Constitutional: Negative for fever and chills.  Eyes: Negative for blurred vision.  Respiratory: Negative for cough, hemoptysis, sputum production, shortness of breath and wheezing.   Cardiovascular: Negative for chest pain and palpitations.  Gastrointestinal: Negative for nausea and vomiting.  Musculoskeletal: Negative for myalgias.  Neurological: Negative for dizziness and headaches.  All other systems reviewed and are negative.   PHYSICAL EXAMINATION: General: alert and awake Neuro: no focal  deficits HEENT: WNL Cardiovascular:  RRR, no M Lungs: clear anteriorly. Diminished BS in R base Abdomen: Soft, NT/ND, normal bowel sounds Ext: no edema  BMET  Recent Labs Lab 11/17/15 0507 11/17/15 1754 11/18/15 0519 11/19/15 0529  NA 133*  --  133* 138  K 2.6* 3.4* 3.0* 3.3*  CL 94*  --  93* 97*  CO2 30  --  30 33*  BUN 32*  --  51* 29*  CREATININE 2.81*  --  3.45* 2.29*  GLUCOSE 231*  --  117* 134*    Electrolytes  Recent Labs Lab 11/15/15 0549 11/16/15 0453 11/17/15 0507 11/17/15 1754 11/18/15 0519 11/19/15 0529  CALCIUM 7.6* 7.8* 8.0*  --  8.5* 8.5*  MG 1.6* 1.9 1.8  --   --   --   PHOS 2.2* 2.0* <1.0* 2.0*  --   --     CBC  Recent Labs Lab 11/17/15 0507 11/18/15 0519 11/19/15 0529  WBC 15.2* 21.3* 25.5*  HGB 9.2* 9.6* 9.4*  HCT 28.4* 29.5* 29.3*  PLT 65* 82* 90*    Coag's No results for input(s): APTT, INR in the last 168 hours.  Sepsis Markers No results for input(s): LATICACIDVEN, PROCALCITON, O2SATVEN in the last 168 hours.  ABG  Recent Labs Lab 11/13/15 2118 11/14/15 1415 11/17/15 0500  PHART 7.44 7.48* 7.47*  PCO2ART 49* 40 43  PO2ART 73* 65* 77*    Liver Enzymes  Recent Labs Lab 11/13/15 0744 11/13/15 1425  AST 25 20  ALT 13* 11*  ALKPHOS 137* 144*  BILITOT 0.9 1.1  ALBUMIN 3.0* 3.1*  Cardiac Enzymes No results for input(s): TROPONINI, PROBNP in the last 168 hours.  Glucose  Recent Labs Lab 11/18/15 2118 11/18/15 2119 11/19/15 0008 11/19/15 0125 11/19/15 0412 11/19/15 0803  GLUCAP 75 73 63* 90 104* 132*    CXR: Cardiomegaly, mild interstitial edema with small bilateral effusions    STUDIES:    CULTURES: Blood 02/22>NTD Urine 02/22>Negative Sputum 02/22>few GPC C-dif antigen 02/23>Positive; toxin negative  ANTIBIOTICS: Zosyn 02/22>>02/25 Vancomycin 02/22>>02/25 Unasyn 02/25 >> 02/26 Enteral vanco 02/23 >>   SIGNIFICANT EVENTS: 02/20>Admitted 02/21>Transferred to  ICU 02/22>Intubated  LINES/TUBES:  Left subclavian TLC 02/22 >>  ETT 02/22 >> 02/26   PULMONARY A: Acute hypoxic respiratory failure secondary to pneumonia and/or pulmonary edema H/O COPD P:  Extubated 02/26 Supplemental O2 as needed -ok to transfer to gen med floor -willstart COPD inhaler regimen  CARDIOVASCULAR A:  Shock-resolved Systolic and diastolic heart failure H/o CAD H/o Hypertension P:  MAP goal > 55 mmHg Holding antihypertensive meds Continue plavix -plan to look for PIV's and remove central line  RENAL A:  ESRD on dialysis Hypokalemia  Hypophosphatemia Hypomagnesemia Hyponatremia P:  Monitor BMET intermittently Monitor I/Os Correct electrolytes as indicated HD per renal service (MWF)  GASTROINTESTINAL A:  Nausea/vomiting-Resolved Post extubation dysphagia Chronic PPI use P:  SUP: PO PPI SLP eval ordered for today  HEMATOLOGIC A:  Anemia of chronic disease Thrombocytopenia,heparin induced Ab within normal range P:  DVT px: SQ heparin Monitor CBC intermittently Transfuse per usual guidelines  INFECTIOUS A:  Severe sepsis Suspect aspiration pneumonia - appears fully treated C-Diff antigen positive P:  Monitor temp, WBC count Micro and abx as above  ENDOCRINE A:  Hypoglycemia, resolved DM 2 Risk of recurrent hypoglycemia with TFs on hold P:  Change SSI to moderate scale Once diet is initiated - will need to change SSI to ACHS     The Patient requires high complexity decision making for assessment and support, frequent evaluation and titration of therapies.  Will sign off at this time  Corrin Parker, M.D.  [facility]  Medical Director [place] Director [facility] Cardio-Pulmonary Department

## 2015-11-19 NOTE — Progress Notes (Signed)
Inpatient Diabetes Program Recommendations  AACE/ADA: New Consensus Statement on Inpatient Glycemic Control (2015)  Target Ranges:  Prepandial:   less than 140 mg/dL      Peak postprandial:   less than 180 mg/dL (1-2 hours)      Critically ill patients:  140 - 180 mg/dL   Review of Glycemic Control:  Results for [name], [name] (MRN [id number]) as of 11/19/2015 10:48  Ref. Range 11/18/2015 16:39 11/18/2015 20:42 11/18/2015 20:43 11/18/2015 21:18 11/18/2015 21:19 11/19/2015 00:08 11/19/2015 01:25 11/19/2015 04:12 11/19/2015 08:03  Glucose-Capillary Latest Ref Range: 65-99 mg/dL 199 (H) 71 69 75 73 63 (L) 90 104 (H) 132 (H)    Diabetes history: None  Inpatient Diabetes Program Recommendations:    Note steroids stopped. Please consider d/c of Novolog correction.   Thanks, Adah Perl, RN, BC-ADM Inpatient Diabetes Coordinator Pager [phone] (8a-5p)

## 2015-11-19 NOTE — Progress Notes (Signed)
[place] [facility] [place] NAME: [name]    MR#:  [id number]  DATE OF BIRTH:  1961-04-20  CHIEF COMPLAINT:   Chief Complaint  Patient presents with  . Nausea  . Emesis    Patient extubated. Currently on room air. Very hard to understand her. Unclear what her base line is.  REVIEW OF SYSTEMS:    Review of Systems  Unable to perform ROS: Confused      DRUG ALLERGIES:   Allergies  Allergen Reactions  . Compazine [Prochlorperazine Edisylate] Anaphylaxis and Nausea And Vomiting  . Ace Inhibitors Swelling  . Ativan [Lorazepam] Other (See Comments)    Reaction:  Hallucinations and headaches  . Codeine Nausea And Vomiting  . Gabapentin Other (See Comments)    Reaction:  Unknown   . Losartan Other (See Comments)    Reaction:  Unknown   . Ondansetron Other (See Comments)    Reaction:  Unknown   . Prochlorperazine Other (See Comments)    Reaction:  Unknown   . Scopolamine Other (See Comments)    Reaction:  Unknown   . Zofran [Ondansetron Hcl] Other (See Comments)    Reaction:  hallucinations   . Oxycodone Anxiety  . Tape Rash    VITALS:  Blood pressure 144/59, pulse 65, temperature 94.7 F (34.8 C), temperature source Axillary, resp. rate 19, height 5\' 5"  (1.651 m), weight 73.2 kg (161 lb 6 oz), SpO2 100 %.  PHYSICAL EXAMINATION:   Physical Exam  GENERAL:  55 y.o.-year-old patient lying in the bed  chronically ill-appearing in no acute distress EYES: Pupils equal, round, reactive to light and accommodation. No scleral icterus. HEENT: Head atraumatic, normocephalic. ET tube in place  NECK:  Supple, no jugular venous distention. No thyroid enlargement, no tenderness.  LUNGS: , On a mechanical ventilator diminished breath sounds bilaterally without any rales rhonchi wheezing  CARDIOVASCULAR: S1, S2 normal. No murmurs, rubs, or gallops.  ABDOMEN: Soft, nontender, nondistended. Bowel sounds present. No organomegaly or mass.   EXTREMITIES: Right hand edema. NEUROLOGIC:   Not oriented to place or time, knows all extremities spontaneously PSYCHIATRIC:l not anxious  SKIN: No obvious rash, lesion, or ulcer.    LABORATORY PANEL:   CBC  Recent Labs Lab 11/19/15 0529  WBC 25.5*  HGB 9.4*  HCT 29.3*  PLT 90*   ------------------------------------------------------------------------------------------------------------------  Chemistries   Recent Labs Lab 11/13/15 1425  11/17/15 0507  11/19/15 0529  NA 133*  < > 133*  < > 138  K 5.0  < > 2.6*  < > 3.3*  CL 93*  < > 94*  < > 97*  CO2 32  < > 30  < > 33*  GLUCOSE 78  < > 231*  < > 134*  BUN 22*  < > 32*  < > 29*  CREATININE 4.13*  < > 2.81*  < > 2.29*  CALCIUM 8.4*  < > 8.0*  < > 8.5*  MG  --   < > 1.8  --   --   AST 20  --   --   --   --   ALT 11*  --   --   --   --   ALKPHOS 144*  --   --   --   --   BILITOT 1.1  --   --   --   --   < > = values in this interval not displayed. ------------------------------------------------------------------------------------------------------------------  Cardiac Enzymes No  results for input(s): TROPONINI in the last 168 hours. ------------------------------------------------------------------------------------------------------------------  RADIOLOGY:  Dg Abd 1 View  11/17/2015  CLINICAL DATA:  Feeding tube placement EXAM: ABDOMEN - 1 VIEW COMPARISON:  11/14/2015 FINDINGS: NG tube is in place with the tip in the mid stomach and the side-port in the proximal to mid stomach. Prior cholecystectomy. IMPRESSION: NG tube tip in the mid stomach. Electronically Signed   By: Rolm Baptise M.D.   On: 11/17/2015 10:57   Dg Chest Port 1 View  11/18/2015  CLINICAL DATA:  Respiratory failure. EXAM: PORTABLE CHEST 1 VIEW COMPARISON:  11/17/2015 FINDINGS: Small bilateral pleural effusions are again noted, stable. Interstitial prominence throughout the lungs. Bilateral perihilar and lower lobe opacities. Findings likely  reflect edema/CHF. No real change since prior study. Heart is borderline in size. Support devices are unchanged. IMPRESSION: Probable pulmonary edema/ CHF. Small layering effusions. No real change since prior study. Electronically Signed   By: Rolm Baptise M.D.   On: 11/18/2015 07:36     ASSESSMENT AND PLAN:     1. Acute respiratory failure secondary to pneumonia, pulmonary edema:  Status post extubation. Continue by mouth vancomycin, Unasyn Speech eval for swallowing  #2. Septic shock: Secondary to C. difficile, pneumonia:  WBC trending up due to steroids. Monitor. No fevers  #3 severe electrolyte imbalance ; currently being replaced per pharmacy consult and nephrology. Repeat phosphorus level in the morning  #4 gastroparesis secondary to diabetes mellitus type 2: Continue Phenergan, Reglan.  #5 Diabetes mellitus type 2 with hypoglycemia:  hypoglycemia improved. Hypoglycemia due to sepsis.  Blood sugar dropped down to 63 yesterday. Continue to monitor closely patient's steroids have been discontinued.   #6 acute on chronic diastolic heart failure in setting of fall ESRD: Continue dialysis as per nephrology recommendation  #7 Coronary artery disease patient is on Plavix, Coreg, amiodarone continue  #8 chronic pancreatitis: Continue Creon  #9 right arm edema, exclude DVT: No evidence of DVT  #10 thrombocytopenia: Platelets have improved Platelet count trending upwards now  #11. Check recurrent C. difficile, status post stool transplant: Continue isolation because of her positive C. difficile antigen   Patient will be transferred to the floor. Physical therapy speech eval will be done.  All the records are reviewed and case discussed with Care Management/Social Workerr. Management plans discussed with the patient, family and they are in agreement.  CODE STATUS:full  TOTAL TIME TAKING CARE OF THIS PATIENT:  35 minutes of follow-up time spent Dustin Flock M.D on 11/19/2015  at 9:28 AM  Between 7am to 6pm - Pager - [phone]  After 6pm go to [email or web address] - password [email or web address] [facility]  Office  [phone]  CC: Primary care physician; Ellamae Sia, MD

## 2015-11-19 NOTE — Progress Notes (Signed)
BCG 63; HYPOGLEMIA PROTOCOL INITIATED. NURSING TO CONTINUE MONITORING

## 2015-11-19 NOTE — Progress Notes (Signed)
Subjective:  Now off the ventilator. Resting comfortably in bed. Follows simple commands this AM.    Objective:  Vital signs in last 24 hours:  Temp:  [94.7 F (34.8 C)-98.4 F (36.9 C)] 94.7 F (34.8 C) (02/27 0800) Pulse Rate:  [63-78] 63 (02/26 2130) Resp:  [13-24] 19 (02/27 0800) BP: (100-144)/(46-59) 144/59 mmHg (02/27 0800) SpO2:  [99 %-100 %] 100 % (02/27 0400) FiO2 (%):  [28 %] 28 % (02/26 0856) Weight:  [73.2 kg (161 lb 6 oz)-75.3 kg (166 lb 0.1 oz)] 73.2 kg (161 lb 6 oz) (02/26 2145)  Weight change: -0.6 kg (-1 lb 5.2 oz) Filed Weights   11/18/15 0428 11/18/15 1750 11/18/15 2145  Weight: 75.9 kg (167 lb 5.3 oz) 75.3 kg (166 lb 0.1 oz) 73.2 kg (161 lb 6 oz)    Intake/Output:    Intake/Output Summary (Last 24 hours) at 11/19/15 [id number] Last data filed at 11/18/15 2145  Gross per 24 hour  Intake      0 ml  Output   2000 ml  Net  -2000 ml     Physical Exam: General: No acute distress  HEENT Anicteric, dry oral mucus membranes  Neck supple  Pulm/lungs Scattered rhonchi, normal effort  CV S1S2 no rubs  Abdomen:  Soft, Non tender, BS present  Extremities: Trace b/l LE edema  Neurologic: Awake, alert, follows commands  Skin: No acute rashes  Access: LUE AVF       Basic Metabolic Panel:   Recent Labs Lab 11/15/15 0549 11/16/15 0453 11/17/15 0507 11/17/15 1754 11/18/15 0519 11/19/15 0529  NA 129* 127* 133*  --  133* 138  K 3.4* 3.6 2.6* 3.4* 3.0* 3.3*  CL 92* 89* 94*  --  93* 97*  CO2 27 27 30   --  30 33*  GLUCOSE 146* 115* 231*  --  117* 134*  BUN 33* 44* 32*  --  51* 29*  CREATININE 4.02* 4.54* 2.81*  --  3.45* 2.29*  CALCIUM 7.6* 7.8* 8.0*  --  8.5* 8.5*  MG 1.6* 1.9 1.8  --   --   --   PHOS 2.2* 2.0* <1.0* 2.0*  --   --      CBC:  Recent Labs Lab 11/15/15 0549 11/16/15 0453 11/17/15 0507 11/18/15 0519 11/19/15 0529  WBC 3.9 9.8 15.2* 21.3* 25.5*  HGB 10.1* 9.3* 9.2* 9.6* 9.4*  HCT 31.7* 28.6* 28.4* 29.5* 29.3*  MCV 85.8 83.6  82.8 82.3 83.2  PLT 108* 81* 65* 82* 90*      Microbiology:  Recent Results (from the past 720 hour(s))  MRSA PCR Screening     Status: None   Collection Time: 11/13/15  9:46 PM  Result Value Ref Range Status   MRSA by PCR NEGATIVE NEGATIVE Final    Comment:        The GeneXpert MRSA Assay (FDA approved for NASAL specimens only), is one component of a comprehensive MRSA colonization surveillance program. It is not intended to diagnose MRSA infection nor to guide or monitor treatment for MRSA infections.   Culture, blood (Routine X 2) w Reflex to ID Panel     Status: None (Preliminary result)   Collection Time: 11/14/15 12:53 AM  Result Value Ref Range Status   Specimen Description BLOOD RIGHT HAND  Final   Special Requests BOTTLES DRAWN AEROBIC AND ANAEROBIC 5ML  Final   Culture NO GROWTH 4 DAYS  Final   Report Status PENDING  Incomplete  Culture, blood (Routine X 2)  w Reflex to ID Panel     Status: None (Preliminary result)   Collection Time: 11/14/15  7:14 AM  Result Value Ref Range Status   Specimen Description BLOOD RIGHT HAND  Final   Special Requests   Final    BOTTLES DRAWN AEROBIC AND ANAEROBIC AER 4ML ANA 2ML   Culture NO GROWTH 4 DAYS  Final   Report Status PENDING  Incomplete  Urine culture     Status: None   Collection Time: 11/14/15 10:16 AM  Result Value Ref Range Status   Specimen Description URINE, RANDOM  Final   Special Requests NONE  Final   Culture NO GROWTH 1 DAY  Final   Report Status 11/15/2015 FINAL  Final  Culture, expectorated sputum-assessment     Status: None   Collection Time: 11/14/15  4:20 PM  Result Value Ref Range Status   Specimen Description SPU  Final   Special Requests NONE  Final   Sputum evaluation THIS SPECIMEN IS ACCEPTABLE FOR SPUTUM CULTURE  Final   Report Status 11/14/2015 FINAL  Final  Culture, respiratory (NON-Expectorated)     Status: None (Preliminary result)   Collection Time: 11/14/15  4:20 PM  Result Value Ref  Range Status   Specimen Description SPU  Final   Special Requests NONE Reflexed from [id number]  Final   Gram Stain   Final    MANY WBC SEEN NO SQUAMOUS EPITHELIAL CELLS PRESENT FEW GRAM POSITIVE COCCI EXCELLENT SPECIMEN - 90-100% WBCS    Culture   Final    LIGHT GROWTH ENTEROBACTER CLOACAE MODERATE GROWTH STREPTOCOCCUS PNEUMONIAE SUSCEPTIBILITIES TO FOLLOW    Report Status PENDING  Incomplete   Organism ID, Bacteria ENTEROBACTER CLOACAE  Final      Susceptibility   Enterobacter cloacae - MIC*    PIP/TAZO Value in next row Sensitive      SENSITIVE8    CEFAZOLIN Value in next row Resistant      RESISTANT>=64    CEFEPIME Value in next row Sensitive      SENSITIVE<=1    CEFTAZIDIME Value in next row Sensitive      SENSITIVE<=1    CEFTRIAXONE Value in next row Sensitive      SENSITIVE<=1    CIPROFLOXACIN Value in next row Sensitive      SENSITIVE<=0.25    GENTAMICIN Value in next row Sensitive      SENSITIVE<=1    IMIPENEM Value in next row Sensitive      SENSITIVE1    TRIMETH/SULFA Value in next row Sensitive      SENSITIVE<=20    * LIGHT GROWTH ENTEROBACTER CLOACAE  C difficile quick scan w PCR reflex     Status: Abnormal   Collection Time: 11/15/15  7:36 AM  Result Value Ref Range Status   C Diff antigen POSITIVE (A) NEGATIVE Final   C Diff toxin NEGATIVE NEGATIVE Final   C Diff interpretation   Final    Negative for toxigenic C. difficile. Toxin gene and active toxin production not detected. May be a nontoxigenic strain of C. difficile bacteria present, lacking the ability to produce toxin.  Clostridium Difficile by PCR     Status: None   Collection Time: 11/15/15  7:36 AM  Result Value Ref Range Status   Toxigenic C Difficile by pcr NEGATIVE NEGATIVE Final    Coagulation Studies: No results for input(s): LABPROT, INR in the last 72 hours.  Urinalysis: No results for input(s): COLORURINE, LABSPEC, [place], [place], [place], [place], [place], [place],  [place],  NITRITE, LEUKOCYTESUR in the last 72 hours.  Invalid input(s): APPERANCEUR    Imaging: Dg Abd 1 View  11/17/2015  CLINICAL DATA:  Feeding tube placement EXAM: ABDOMEN - 1 VIEW COMPARISON:  11/14/2015 FINDINGS: NG tube is in place with the tip in the mid stomach and the side-port in the proximal to mid stomach. Prior cholecystectomy. IMPRESSION: NG tube tip in the mid stomach. Electronically Signed   By: Rolm Baptise M.D.   On: 11/17/2015 10:57   Dg Chest Port 1 View  11/18/2015  CLINICAL DATA:  Respiratory failure. EXAM: PORTABLE CHEST 1 VIEW COMPARISON:  11/17/2015 FINDINGS: Small bilateral pleural effusions are again noted, stable. Interstitial prominence throughout the lungs. Bilateral perihilar and lower lobe opacities. Findings likely reflect edema/CHF. No real change since prior study. Heart is borderline in size. Support devices are unchanged. IMPRESSION: Probable pulmonary edema/ CHF. Small layering effusions. No real change since prior study. Electronically Signed   By: Rolm Baptise M.D.   On: 11/18/2015 07:36     Medications:   . norepinephrine (LEVOPHED) Adult infusion Stopped (11/18/15 1049)   . acidophilus  2 capsule Oral TID  . amiodarone  200 mg Oral Daily  . antiseptic oral rinse  7 mL Mouth Rinse 10 times per day  . aspirin  81 mg Oral Daily  . atorvastatin  20 mg Oral QPM  . bacitracin   Topical BID  . chlorhexidine gluconate  15 mL Mouth Rinse BID  . cholecalciferol  400 Units Per Tube Daily  . citalopram  20 mg Oral Daily  . clopidogrel  75 mg Oral Daily  . insulin aspart  0-15 Units Subcutaneous 6 times per day  . ipratropium-albuterol  3 mL Nebulization Q6H  . lipase/protease/amylase  12,000 Units Oral TID WC  . pantoprazole  40 mg Oral Q1200  . vancomycin  125 mg Oral 4 times per day  . vitamin B-12  1,000 mcg Oral Daily   acetaminophen **OR** acetaminophen, albuterol, alum & mag hydroxide-simeth, bisacodyl, dextrose, iohexol, nitroGLYCERIN,  senna-docusate  Assessment/ Plan:  55 y.o. 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    [name]  1. End Stage Renal Disease: MWF -Had HD yesterday for additional volume removal, will plan for HD again today.  2. Anemia of chronic kidney disease:  - hemoglobin now 9.4 - Avoid EPO until patient sees oncologist as masses c/w RCC seen on CT abdomen on 2/20  3.  Acute resp failure - combination of Pneumonia, Anasarca, large pleural effusions -  Off the ventilator at the moment, breathing comfortably.   4.  Hypokalemia - K up to 3.3, should come up with HD today.  5. Gastroparesis, Nausea and vomiting - symptomatic management      LOS:  Makensey Rego 2/27/20178:33 AM

## 2015-11-19 NOTE — Progress Notes (Signed)
TX started 

## 2015-11-20 LAB — GLUCOSE, CAPILLARY
Glucose-Capillary: 102 mg/dL — ABNORMAL HIGH (ref 65–99)
Glucose-Capillary: 139 mg/dL — ABNORMAL HIGH (ref 65–99)
Glucose-Capillary: 139 mg/dL — ABNORMAL HIGH (ref 65–99)
Glucose-Capillary: 150 mg/dL — ABNORMAL HIGH (ref 65–99)
Glucose-Capillary: 158 mg/dL — ABNORMAL HIGH (ref 65–99)
Glucose-Capillary: 55 mg/dL — ABNORMAL LOW (ref 65–99)
Glucose-Capillary: 59 mg/dL — ABNORMAL LOW (ref 65–99)
Glucose-Capillary: 60 mg/dL — ABNORMAL LOW (ref 65–99)
Glucose-Capillary: 67 mg/dL (ref 65–99)
Glucose-Capillary: 71 mg/dL (ref 65–99)
Glucose-Capillary: 95 mg/dL (ref 65–99)

## 2015-11-20 LAB — RENAL FUNCTION PANEL
Albumin: 2 g/dL — ABNORMAL LOW (ref 3.5–5.0)
Anion gap: 9 (ref 5–15)
BUN: 18 mg/dL (ref 6–20)
CO2: 33 mmol/L — ABNORMAL HIGH (ref 22–32)
Calcium: 8.5 mg/dL — ABNORMAL LOW (ref 8.9–10.3)
Chloride: 98 mmol/L — ABNORMAL LOW (ref 101–111)
Creatinine, Ser: 1.79 mg/dL — ABNORMAL HIGH (ref 0.44–1.00)
GFR calc Af Amer: 35 mL/min — ABNORMAL LOW (ref >60)
GFR calc non Af Amer: 31 mL/min — ABNORMAL LOW (ref >60)
Glucose, Bld: 179 mg/dL — ABNORMAL HIGH (ref 65–99)
Phosphorus: 1.6 mg/dL — ABNORMAL LOW (ref 2.5–4.6)
Potassium: 3.3 mmol/L — ABNORMAL LOW (ref 3.5–5.1)
Sodium: 140 mmol/L (ref 135–145)

## 2015-11-20 LAB — CULTURE, BLOOD (ROUTINE X 2)
Culture: NO GROWTH
Culture: NO GROWTH

## 2015-11-20 MED ORDER — TRAMADOL HCL 50 MG PO TABS
50.0000 mg | ORAL_TABLET | Freq: Four times a day (QID) | ORAL | Status: DC | PRN
  Administered 2015-11-20 – 2015-11-25 (×6): 50 mg via ORAL
  Filled 2015-11-20 (×7): qty 1

## 2015-11-20 MED ORDER — POTASSIUM CHLORIDE CRYS ER 20 MEQ PO TBCR
40.0000 meq | EXTENDED_RELEASE_TABLET | Freq: Once | ORAL | Status: AC
  Administered 2015-11-20: 40 meq via ORAL
  Filled 2015-11-20: qty 2

## 2015-11-20 MED ORDER — K PHOS MONO-SOD PHOS DI & MONO 155-852-130 MG PO TABS
500.0000 mg | ORAL_TABLET | ORAL | Status: AC
  Administered 2015-11-20 – 2015-11-21 (×4): 500 mg via ORAL
  Filled 2015-11-20 (×4): qty 2

## 2015-11-20 NOTE — Progress Notes (Signed)
Pt CBG is 67 at this time, insulin coverage was held. RN has given pt applesauce with one pack of sugar in it. Recheck CBG at 1206 and was 95. Will continue to monitor pt.   [name]

## 2015-11-20 NOTE — Care Management (Signed)
PT recommending SNF.  CSW following

## 2015-11-20 NOTE — Progress Notes (Signed)
Speech Language Pathology Treatment: Dysphagia  Patient Details Name: [name] MRN: [id number] DOB: 02-02-1961 Today's Date: 11/20/2015 Time: 1415-1500 SLP Time Calculation (min) (ACUTE ONLY): 45 min  Assessment / Plan / Recommendation Clinical Impression  Pt appears to present continued, overt s/s of aspiration c/b coughing w/ trials of thin liquids despite aspiration precautions utilized. Pt appeared to better tolerate trials of Nectar consistency liquids w/ no overt s/s of aspiration noted during intake. Suspect this pharyngeal phase dysphagia presentation could be related to pt's recent oral intubation. Rec. F/u w/ MBSS to objectively assess swallow function and make recommendations for diet and POC. ST will f/u tomorrow w/ this. NSG and CM updated. Rec. Continue w/ current dysphagia diet at this time as ordered.    HPI HPI: Pt is a 55 y.o. female with a known history of ESRD, CAD and Asacol failure who presents with abdominal pain, nausea and vomiting for the past day. Patient is also complaining of shortness of breath. Patient did not go for dialysis today because of abdominal pain. CT of the abdomen ordered in the emergency room showed no acute pathology. Patient reports that her granddaughters have been sick. Pt exhibited s/s of aspiration w/ po trials during BSE yesterday and was placed on a dys. 1 w/ Nectar liquids diet; adequate toleration of current diet per NSG report.       SLP Plan  MBS;Continue with current plan of care     Recommendations  Diet recommendations: Dysphagia 1 (puree);Nectar-thick liquid Liquids provided via: Cup Medication Administration: Crushed with puree Supervision: Patient able to self feed;Intermittent supervision to cue for compensatory strategies;Staff to assist with self feeding Compensations: Minimize environmental distractions;Slow rate;Small sips/bites;Follow solids with liquid Postural Changes and/or Swallow Maneuvers: Seated upright 90 degrees            Oral Care Recommendations: Oral care BID;Staff/trained caregiver to provide oral care Follow up Recommendations: Skilled Nursing facility Plan: MBS;Continue with current plan of care     [place], [place], CCC-SLP  [name] 11/20/2015, 3:59 PM

## 2015-11-20 NOTE — NC FL2 (Signed)
[place] LEVEL OF CARE SCREENING TOOL     IDENTIFICATION  Patient Name: [name] Birthdate: 04/02/1961 Sex: female Admission Date (Current Location): 11/12/2015  [place] and [place] Number:  Engineering geologist and Address:  [facility], [address], [place], [place] [address]      Provider Number: [id number]  Attending Physician Name and Address:  Dustin Flock, MD  Relative Name and Phone Number:       Current Level of Care: Hospital Recommended Level of Care: [place] Prior Approval Number:    Date Approved/Denied:   PASRR Number:    Discharge Plan: SNF    Current Diagnoses: Patient Active Problem List   Diagnosis Date Noted  . Acute renal failure ([place])   . Respiratory failure ([place])   . High temperature 11/14/2015  . Pulmonary edema   . Encounter for central line placement   . Encounter for orogastric (OG) tube placement   . Nausea 11/12/2015  . Hyperkalemia 10/03/2015  . C. difficile diarrhea 07/22/2015  . Pneumonia 05/21/2015  . Hypoglycemia 04/24/2015  . Unresponsiveness 04/24/2015  . Bradycardia 04/24/2015  . Hypothermia 04/24/2015  . Acute respiratory failure ([place]) 04/24/2015  . Acute diastolic CHF (congestive heart failure) ([place]) 04/05/2015  . Diabetic gastroparesis ([place]) 04/05/2015  . Hypokalemia 04/05/2015  . Generalized weakness 04/05/2015  . Acute pulmonary edema ([place]) 04/03/2015  . Nausea and vomiting 04/03/2015  . Hypoglycemia associated with diabetes ([place]) 04/03/2015  . Anemia of chronic disease 04/03/2015  . Secondary hyperparathyroidism ([place]) 04/03/2015  . Pressure ulcer 04/02/2015  . Acute respiratory failure with hypoxia ([place]) 04/01/2015  . Adjustment disorder with anxiety 03/14/2015  . Somatic symptom disorder, mild 03/08/2015  . Coronary artery disease involving native coronary artery of native heart without angina pectoris   . Nausea & vomiting 03/06/2015  . Abdominal  pain 03/06/2015  . DM (diabetes mellitus) ([place]) 03/06/2015  . HTN (hypertension) 03/06/2015  . Gastroparesis 02/24/2015  . Pleural effusion 02/19/2015  . HCAP (healthcare-associated pneumonia) 02/19/2015  . End-stage renal disease on hemodialysis ([place]) 02/19/2015    Orientation RESPIRATION BLADDER Height & Weight     Self, Time, Situation, Place  Normal Continent Weight: 155 lb 6.8 oz (70.5 kg) Height:  5\' 5"  (165.1 cm)  BEHAVIORAL SYMPTOMS/MOOD NEUROLOGICAL BOWEL NUTRITION STATUS   (none)  (none) Incontinent Diet (dysphagia 1)  AMBULATORY STATUS COMMUNICATION OF NEEDS Skin   Extensive Assist Verbally Normal                       Personal Care Assistance Level of Assistance  Bathing, Dressing Bathing Assistance: Limited assistance   Dressing Assistance: Limited assistance     Functional Limitations Info   (none)          SPECIAL CARE FACTORS FREQUENCY  PT (By licensed PT) (dialysis)                    Contractures Contractures Info: Not present    Additional Factors Info  Isolation Precautions         Isolation Precautions Info: cdiff     Current Medications (11/20/2015):  This is the current hospital active medication list Current Facility-Administered Medications  Medication Dose Route Frequency Provider Last Rate Last Dose  . acetaminophen (TYLENOL) tablet 650 mg  650 mg Oral Q6H PRN Bettey Costa, MD   650 mg at 11/20/15 0103   Or  . acetaminophen (TYLENOL) suppository 650 mg  650  mg Rectal Q6H PRN Bettey Costa, MD   650 mg at 11/13/15 2042  . acidophilus (RISAQUAD) capsule 2 capsule  2 capsule Oral TID Epifanio Lesches, MD   2 capsule at 11/20/15 0904  . albuterol (PROVENTIL) (2.5 MG/3ML) 0.083% nebulizer solution 2.5 mg  2.5 mg Nebulization Q4H PRN Bettey Costa, MD   2.5 mg at 11/13/15 1028  . alum & mag hydroxide-simeth (MAALOX/MYLANTA) 200-200-20 MG/5ML suspension 30 mL  30 mL Per Tube Q6H PRN Mikael Spray, NP      . amiodarone (PACERONE)  tablet 200 mg  200 mg Oral Daily Wilhelmina Mcardle, MD   200 mg at 11/20/15 [id number]  . antiseptic oral rinse solution (CORINZ)  7 mL Mouth Rinse 10 times per day Laverle Hobby, MD   7 mL at 11/20/15 0907  . aspirin chewable tablet 81 mg  81 mg Oral Daily Wilhelmina Mcardle, MD   81 mg at 11/20/15 [id number]  . atorvastatin (LIPITOR) tablet 20 mg  20 mg Oral QPM Wilhelmina Mcardle, MD   20 mg at 11/19/15 1730  . bacitracin ointment   Topical BID Bettey Costa, MD   [id number] application at [id number] [phone]  . bisacodyl (DULCOLAX) EC tablet 5 mg  5 mg Oral Daily PRN Bettey Costa, MD      . chlorhexidine gluconate (PERIDEX) 0.12 % solution 15 mL  15 mL Mouth Rinse BID Laverle Hobby, MD   15 mL at 11/20/15 0813  . cholecalciferol (VITAMIN D) tablet 400 Units  400 Units Per Tube Daily Mikael Spray, NP   400 Units at 11/20/15 0904  . citalopram (CELEXA) tablet 20 mg  20 mg Oral Daily Wilhelmina Mcardle, MD   20 mg at 11/20/15 [id number]  . clopidogrel (PLAVIX) tablet 75 mg  75 mg Oral Daily Wilhelmina Mcardle, MD   75 mg at 11/20/15 [id number]  . dextrose 50 % solution 25 g  25 g Intravenous Q30 min PRN Laverle Hobby, MD   25 g at 11/19/15 0017  . insulin aspart (novoLOG) injection 0-15 Units  0-15 Units Subcutaneous 6 times per day Wilhelmina Mcardle, MD   3 Units at 11/20/15 [phone]  . iohexol (OMNIPAQUE) 240 MG/ML injection 25 mL  25 mL Oral Once PRN Loney Hering, MD      . ipratropium-albuterol (DUONEB) 0.5-2.5 (3) MG/3ML nebulizer solution 3 mL  3 mL Nebulization TID Dustin Flock, MD   3 mL at 11/20/15 0734  . lipase/protease/amylase (CREON) capsule 12,000 Units  12,000 Units Oral TID WC Mikael Spray, NP   12,000 Units at 11/20/15 0817  . nitroGLYCERIN (NITROSTAT) SL tablet 0.4 mg  0.4 mg Sublingual Q5 Min x 3 PRN Sital Mody, MD      . pantoprazole (PROTONIX) EC tablet 40 mg  40 mg Oral Q1200 Wilhelmina Mcardle, MD   40 mg at 11/19/15 1231  . potassium chloride SA (K-DUR,KLOR-CON) CR tablet 40 mEq  40 mEq Oral  Once Dustin Flock, MD   40 mEq at 11/19/15 1212  . senna-docusate (Senokot-S) tablet 1 tablet  1 tablet Oral QHS PRN Mikael Spray, NP      . traMADol (ULTRAM) tablet 50 mg  50 mg Oral Q6H PRN Dustin Flock, MD      . vancomycin (VANCOCIN) 50 mg/mL oral solution 125 mg  125 mg Oral 4 times per day Epifanio Lesches, MD   125 mg at 11/20/15 0537  . vitamin B-12 (CYANOCOBALAMIN) tablet 1,000 mcg  1,000  mcg Oral Daily Wilhelmina Mcardle, MD   1,000 mcg at 11/20/15 [id number]     Discharge Medications: Please see discharge summary for a list of discharge medications.  Relevant Imaging Results:  Relevant Lab Results:   Additional Information SS: [id number]  Shela Leff, LCSW

## 2015-11-20 NOTE — Clinical Social Work Note (Signed)
Clinical Social Work Assessment  Patient Details  Name: [name] MRN: [id number] Date of Birth: 1961/09/22  Date of referral:  11/20/15               Reason for consult:  Facility Placement                Permission sought to share information with:  Facility Art therapist granted to share information::  Yes, Verbal Permission Granted  Name::        Agency::     Relationship::     Contact Information:     Housing/Transportation Living arrangements for the past 2 months:  Single Family Home Source of Information:  Patient Patient Interpreter Needed:  None Criminal Activity/Legal Involvement Pertinent to Current Situation/Hospitalization:  No - Comment as needed Significant Relationships:    Lives with:  Self Do you feel safe going back to the place where you live?    Need for family participation in patient care:     Care giving concerns:  Patient resides at home and receives outpatient dialysis.   Social Worker assessment / plan:  CSW noted that PT has recommended STR. CSW met with patient briefly as she was not feeling well and she is in agreement with STR.  Bedsearch to be initiated.   Employment status:  Disabled (Comment on whether or not currently receiving Disability) Insurance information:  Medicare PT Recommendations:  [place] / Referral to community resources:  [place]  Patient/Family's Response to care:  Patient expressed appreciation for CSW assistance.   Patient/Family's Understanding of and Emotional Response to Diagnosis, Current Treatment, and Prognosis:  Patient not feeling well today and somewhat lethargic.   Emotional Assessment Appearance:  Appears older than stated age Attitude/Demeanor/Rapport:  Lethargic Affect (typically observed):  Accepting, Calm Orientation:  Oriented to Self, Oriented to Place, Oriented to  Time, Oriented to Situation Alcohol / Substance use:  Not  Applicable Psych involvement (Current and /or in the community):  No (Comment)  Discharge Needs  Concerns to be addressed:  Care Coordination Readmission within the last 30 days:  No Current discharge risk:  None Barriers to Discharge:  No Barriers Identified   Shela Leff, LCSW 11/20/2015, 11:46 AM

## 2015-11-20 NOTE — Progress Notes (Signed)
Pt wanted to sit in chair and thought it would help her breathing. RN check O2 levels and was 100%. Pt required two people to help her ambulate to the chair and back to the bed. Will continue to monitor pt.   [name]

## 2015-11-20 NOTE — Progress Notes (Signed)
PRD HD

## 2015-11-20 NOTE — Progress Notes (Signed)
Subjective:  Pt seen at bedside.  Still having some shortness of breath.  Due for dialysis again tomorrow.   Objective:  Vital signs in last 24 hours:  Temp:  [97.5 F (36.4 C)-97.8 F (36.6 C)] 97.5 F (36.4 C) (02/28 1312) Pulse Rate:  [66-74] 70 (02/28 1312) Resp:  [16-18] 16 (02/28 1312) BP: (127-149)/(46-58) 142/58 mmHg (02/28 1312) SpO2:  [93 %-100 %] 100 % (02/28 1312) Weight:  [70.5 kg (155 lb 6.8 oz)] 70.5 kg (155 lb 6.8 oz) (02/27 2100)  Weight change: -4.8 kg (-10 lb 9.3 oz) Filed Weights   11/18/15 1750 11/18/15 2145 11/19/15 2100  Weight: 75.3 kg (166 lb 0.1 oz) 73.2 kg (161 lb 6 oz) 70.5 kg (155 lb 6.8 oz)    Intake/Output:    Intake/Output Summary (Last 24 hours) at 11/20/15 1939 Last data filed at 11/20/15 1600  Gross per 24 hour  Intake    360 ml  Output   1500 ml  Net  -1140 ml     Physical Exam: General: No acute distress  HEENT Anicteric, dry oral mucus membranes  Neck supple  Pulm/lungs Scattered rhonchi, normal effort  CV S1S2 no rubs  Abdomen:  Soft, Non tender, BS present  Extremities: Trace b/l LE edema  Neurologic: Awake, alert, follows commands  Skin: No acute rashes  Access: LUE AVF       Basic Metabolic Panel:   Recent Labs Lab 11/15/15 0549 11/16/15 0453 11/17/15 0507 11/17/15 1754 11/18/15 0519 11/19/15 0529 11/20/15 0547  NA 129* 127* 133*  --  133* 138 140  K 3.4* 3.6 2.6* 3.4* 3.0* 3.3* 3.3*  CL 92* 89* 94*  --  93* 97* 98*  CO2 27 27 30   --  30 33* 33*  GLUCOSE 146* 115* 231*  --  117* 134* 179*  BUN 33* 44* 32*  --  51* 29* 18  CREATININE 4.02* 4.54* 2.81*  --  3.45* 2.29* 1.79*  CALCIUM 7.6* 7.8* 8.0*  --  8.5* 8.5* 8.5*  MG 1.6* 1.9 1.8  --   --   --   --   PHOS 2.2* 2.0* <1.0* 2.0*  --   --  1.6*     CBC:  Recent Labs Lab 11/15/15 0549 11/16/15 0453 11/17/15 0507 11/18/15 0519 11/19/15 0529  WBC 3.9 9.8 15.2* 21.3* 25.5*  HGB 10.1* 9.3* 9.2* 9.6* 9.4*  HCT 31.7* 28.6* 28.4* 29.5* 29.3*  MCV  85.8 83.6 82.8 82.3 83.2  PLT 108* 81* 65* 82* 90*      Microbiology:  Recent Results (from the past 720 hour(s))  MRSA PCR Screening     Status: None   Collection Time: 11/13/15  9:46 PM  Result Value Ref Range Status   MRSA by PCR NEGATIVE NEGATIVE Final    Comment:        The GeneXpert MRSA Assay (FDA approved for NASAL specimens only), is one component of a comprehensive MRSA colonization surveillance program. It is not intended to diagnose MRSA infection nor to guide or monitor treatment for MRSA infections.   Culture, blood (Routine X 2) w Reflex to ID Panel     Status: None   Collection Time: 11/14/15 12:53 AM  Result Value Ref Range Status   Specimen Description BLOOD RIGHT HAND  Final   Special Requests BOTTLES DRAWN AEROBIC AND ANAEROBIC 5ML  Final   Culture NO GROWTH 6 DAYS  Final   Report Status 11/20/2015 FINAL  Final  Culture, blood (Routine X 2)  w Reflex to ID Panel     Status: None   Collection Time: 11/14/15  7:14 AM  Result Value Ref Range Status   Specimen Description BLOOD RIGHT HAND  Final   Special Requests   Final    BOTTLES DRAWN AEROBIC AND ANAEROBIC AER 4ML ANA 2ML   Culture NO GROWTH 6 DAYS  Final   Report Status 11/20/2015 FINAL  Final  Urine culture     Status: None   Collection Time: 11/14/15 10:16 AM  Result Value Ref Range Status   Specimen Description URINE, RANDOM  Final   Special Requests NONE  Final   Culture NO GROWTH 1 DAY  Final   Report Status 11/15/2015 FINAL  Final  Culture, expectorated sputum-assessment     Status: None   Collection Time: 11/14/15  4:20 PM  Result Value Ref Range Status   Specimen Description SPU  Final   Special Requests NONE  Final   Sputum evaluation THIS SPECIMEN IS ACCEPTABLE FOR SPUTUM CULTURE  Final   Report Status 11/14/2015 FINAL  Final  Culture, respiratory (NON-Expectorated)     Status: None   Collection Time: 11/14/15  4:20 PM  Result Value Ref Range Status   Specimen Description SPU   Final   Special Requests NONE Reflexed from [id number]  Final   Gram Stain   Final    MANY WBC SEEN NO SQUAMOUS EPITHELIAL CELLS PRESENT FEW GRAM POSITIVE COCCI EXCELLENT SPECIMEN - 90-100% WBCS    Culture   Final    LIGHT GROWTH ENTEROBACTER CLOACAE MODERATE GROWTH STREPTOCOCCUS PNEUMONIAE    Report Status 11/18/2015 FINAL  Final   Organism ID, Bacteria ENTEROBACTER CLOACAE  Final   Organism ID, Bacteria STREPTOCOCCUS PNEUMONIAE  Final      Susceptibility   Streptococcus pneumoniae - MIC*    ERYTHROMYCIN <=0.12 SENSITIVE Sensitive     LEVOFLOXACIN 1 SENSITIVE Sensitive     VANCOMYCIN 0.5 SENSITIVE Sensitive     PENICILLIN <=0.06 SENSITIVE Sensitive     CEFTRIAXONE <=0.12 SENSITIVE Sensitive     * MODERATE GROWTH STREPTOCOCCUS PNEUMONIAE   Enterobacter cloacae - MIC*    PIP/TAZO Value in next row Sensitive      SENSITIVE8    CEFAZOLIN Value in next row Resistant      RESISTANT>=64    CEFEPIME Value in next row Sensitive      SENSITIVE<=1    CEFTAZIDIME Value in next row Sensitive      SENSITIVE<=1    CEFTRIAXONE Value in next row Sensitive      SENSITIVE<=1    CIPROFLOXACIN Value in next row Sensitive      SENSITIVE<=0.25    GENTAMICIN Value in next row Sensitive      SENSITIVE<=1    IMIPENEM Value in next row Sensitive      SENSITIVE1    TRIMETH/SULFA Value in next row Sensitive      SENSITIVE<=20    * LIGHT GROWTH ENTEROBACTER CLOACAE  C difficile quick scan w PCR reflex     Status: Abnormal   Collection Time: 11/15/15  7:36 AM  Result Value Ref Range Status   C Diff antigen POSITIVE (A) NEGATIVE Final   C Diff toxin NEGATIVE NEGATIVE Final   C Diff interpretation   Final    Negative for toxigenic C. difficile. Toxin gene and active toxin production not detected. May be a nontoxigenic strain of C. difficile bacteria present, lacking the ability to produce toxin.  Clostridium Difficile by PCR  Status: None   Collection Time: 11/15/15  7:36 AM  Result Value Ref  Range Status   Toxigenic C Difficile by pcr NEGATIVE NEGATIVE Final    Coagulation Studies: No results for input(s): LABPROT, INR in the last 72 hours.  Urinalysis: No results for input(s): COLORURINE, LABSPEC, PHURINE, GLUCOSEU, HGBUR, BILIRUBINUR, KETONESUR, PROTEINUR, UROBILINOGEN, NITRITE, LEUKOCYTESUR in the last 72 hours.  Invalid input(s): APPERANCEUR    Imaging: No results found.   Medications:     . acidophilus  2 capsule Oral TID  . amiodarone  200 mg Oral Daily  . antiseptic oral rinse  7 mL Mouth Rinse 10 times per day  . aspirin  81 mg Oral Daily  . atorvastatin  20 mg Oral QPM  . bacitracin   Topical BID  . chlorhexidine gluconate  15 mL Mouth Rinse BID  . cholecalciferol  400 Units Per Tube Daily  . citalopram  20 mg Oral Daily  . clopidogrel  75 mg Oral Daily  . insulin aspart  0-15 Units Subcutaneous 6 times per day  . ipratropium-albuterol  3 mL Nebulization TID  . lipase/protease/amylase  12,000 Units Oral TID WC  . pantoprazole  40 mg Oral Q1200  . phosphorus  500 mg Oral 6 times per day  . potassium chloride  40 mEq Oral Once  . vitamin B-12  1,000 mcg Oral Daily   acetaminophen **OR** acetaminophen, albuterol, alum & mag hydroxide-simeth, bisacodyl, dextrose, iohexol, nitroGLYCERIN, senna-docusate, traMADol  Assessment/ Plan:  55 y.o. 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    [facility] M[facility] [place]  1. End Stage Renal Disease: MWF -Pt seen at bedside, no acute indication for HD today, will plan for HD again tomorrow.  2. Anemia of chronic kidney disease:  - Hgb 9.4, holding off on epogen given right renal masses x 2.  3.  Acute resp failure - combination of Pneumonia, Anasarca, large pleural effusions -  Doing well off the  ventilator.   4.  Hypokalemia - K remains low at 3.3, reassess tomorrow, may need to provide supplementation.  5. Gastroparesis, Nausea and vomiting - symptomatic management  6.  Renal mass x 2.  Suggestive of small and early RCC.  Will obtain urology consultation tomorrow for this.        LOS:  Nycholas Rayner 2/28/20177:39 PM

## 2015-11-20 NOTE — Care Management Important Message (Signed)
Important Message  Patient Details  Name: [name] MRN: [id number] Date of Birth: 09/16/1961   Medicare Important Message Given:  Yes    Beverly Sessions, RN 11/20/2015, 9:46 AM

## 2015-11-20 NOTE — Progress Notes (Signed)
[place] [facility] [place] NAME: Teagin Christoffel    MR#:  [id number]  DATE OF BIRTH:  [birth year]  CHIEF COMPLAINT:   Chief Complaint  Patient presents with  . Nausea  . Emesis    Patient more awake sitting in the chair, complains of pain in her back and still some sob  REVIEW OF SYSTEMS:    Review of Systems   CONSTITUTIONAL: No documented fever. Positive fatigue and weakness. No weight gain, no weight loss.  EYES: No blurry or double vision.  ENT: No tinnitus. No postnasal drip. No redness of the oropharynx.  RESPIRATORY: No cough, no wheeze, no hemoptysis. Positive dyspnea.  CARDIOVASCULAR: No chest pain. No orthopnea. No palpitations. No syncope.  GASTROINTESTINAL: No nausea, no vomiting or diarrhea. No abdominal pain. No melena or hematochezia.  GENITOURINARY:  No urgency. No frequency. No dysuria. No hematuria. No obstructive symptoms. No discharge. No pain. No significant abnormal bleeding ENDOCRINE: No polyuria or nocturia. No heat or cold intolerance.  HEMATOLOGY: No anemia. No bruising. No bleeding. No purpura. No petechiae INTEGUMENTARY: No rashes. No lesions.  MUSCULOSKELETAL: No arthritis. No swelling. No gout.  NEUROLOGIC: No numbness, tingling, or ataxia. No seizure-type activity.  PSYCHIATRIC: No anxiety. No insomnia. No ADD.         DRUG ALLERGIES:   Allergies  Allergen Reactions  . Compazine [Prochlorperazine Edisylate] Anaphylaxis and Nausea And Vomiting  . Ace Inhibitors Swelling  . Ativan [Lorazepam] Other (See Comments)    Reaction:  Hallucinations and headaches  . Codeine Nausea And Vomiting  . Gabapentin Other (See Comments)    Reaction:  Unknown   . Losartan Other (See Comments)    Reaction:  Unknown   . Ondansetron Other (See Comments)    Reaction:  Unknown   . Prochlorperazine Other (See Comments)    Reaction:  Unknown   . Scopolamine Other (See Comments)    Reaction:  Unknown   . Zofran  [Ondansetron Hcl] Other (See Comments)    Reaction:  hallucinations   . Oxycodone Anxiety  . Tape Rash    VITALS:  Blood pressure 142/58, pulse 70, temperature 97.5 F (36.4 C), temperature source Oral, resp. rate 16, height 5\' 5"  (1.651 m), weight 70.5 kg (155 lb 6.8 oz), SpO2 100 %.  PHYSICAL EXAMINATION:   Physical Exam  GENERAL:  55 y.o.-year-old patient lying in the bed  chronically ill-appearing in no acute distress EYES: Pupils equal, round, reactive to light and accommodation. No scleral icterus. HEENT: Head atraumatic, normocephalic.  NECK:  Supple, no jugular venous distention. No thyroid enlargement, no tenderness.  LUNGS: , On a mechanical ventilator diminished breath sounds bilaterally without any rales rhonchi wheezing  CARDIOVASCULAR: S1, S2 normal. No murmurs, rubs, or gallops.  ABDOMEN: Soft, nontender, nondistended. Bowel sounds present. No organomegaly or mass.  EXTREMITIES: Right hand edema. NEUROLOGIC:   Not oriented to place or time, moves all extremities spontaneously PSYCHIATRIC: not anxious  SKIN: No obvious rash, lesion, or ulcer.    LABORATORY PANEL:   CBC  Recent Labs Lab 11/19/15 0529  WBC 25.5*  HGB 9.4*  HCT 29.3*  PLT 90*   ------------------------------------------------------------------------------------------------------------------  Chemistries   Recent Labs Lab 11/17/15 0507  11/20/15 0547  NA 133*  < > 140  K 2.6*  < > 3.3*  CL 94*  < > 98*  CO2 30  < > 33*  GLUCOSE 231*  < > 179*  BUN 32*  < > 18  CREATININE 2.81*  < > 1.79*  CALCIUM 8.0*  < > 8.5*  MG 1.8  --   --   < > = values in this interval not displayed. ------------------------------------------------------------------------------------------------------------------  Cardiac Enzymes No results for input(s): TROPONINI in the last 168  hours. ------------------------------------------------------------------------------------------------------------------  RADIOLOGY:  No results found.   ASSESSMENT AND PLAN:     1. Acute respiratory failure secondary to pneumonia, pulmonary edema:  Status post extubation. Continue  vancomycin, Unasyn  repeat cxr tomm  #2. Septic shock: Secondary to C. difficile, pneumonia:  CBC in the morning  #3 severe electrolyte imbalance ; currently being replaced per pharmacy consult and nephrology. Repeat phosphorus level in the morning  #4 gastroparesis secondary to diabetes mellitus type 2: Continue Phenergan, Reglan.  #5 Diabetes mellitus type 2 with hypoglycemia:  hypoglycemia improved. Hypoglycemia due to sepsis.   Continue to monitor   #6 acute on chronic diastolic heart failure in setting of fall ESRD: Continue dialysis as per nephrology recommendation  #7 Coronary artery disease patient is on Plavix, Coreg, amiodarone continue  #8 chronic pancreatitis: Continue Creon  #9 right arm edema, exclude DVT: No evidence of DVT  #10 thrombocytopenia: Platelets have improved   #11.  H/o c.diff  + c.diff antifen Patient will be transferred to the floor. Physical therapy speech eval will be done.  All the records are reviewed and case discussed with Care Management/Social Workerr. Management plans discussed with the patient, family and they are in agreement.  CODE STATUS:full  TOTAL TIME TAKING CARE OF THIS PATIENT:  25 minutes of follow-up time spent Dustin Flock M.D on 11/20/2015 at 3:12 PM  Between 7am to 6pm - Pager - [phone]  After 6pm go to [email or web address] - password [email or web address]  Office  [phone]  CC: Primary care physician; Ellamae Sia, MD

## 2015-11-20 NOTE — Progress Notes (Signed)
Post HD. Pt tolerated tx, 1546ml UF removed. VSS, Denies SOB or any other discomforts. Access achieved hemostasis. Monitor access for bleeding, remove guze in 4hrs.

## 2015-11-20 NOTE — Progress Notes (Signed)
Tx, ended

## 2015-11-20 NOTE — Progress Notes (Signed)
Pt stated to RN that she could not breath around 1630, RN checked O2 and was 95%. RN applied O2 2 Liters and pt stated she felt relief, current O2 100%. Pt fell asleep around 1700 and took her O2 off and O2 stat dropped to 89. RN reapplied O2 on 2 liters. Dr. Posey Pronto was notified, no response at this time. Will continue to monitor pt.  Angus Seller

## 2015-11-21 ENCOUNTER — Encounter: Payer: Self-pay | Admitting: Radiology

## 2015-11-21 ENCOUNTER — Inpatient Hospital Stay: Payer: Medicare Other

## 2015-11-21 LAB — GLUCOSE, CAPILLARY
Glucose-Capillary: 103 mg/dL — ABNORMAL HIGH (ref 65–99)
Glucose-Capillary: 104 mg/dL — ABNORMAL HIGH (ref 65–99)
Glucose-Capillary: 107 mg/dL — ABNORMAL HIGH (ref 65–99)
Glucose-Capillary: 113 mg/dL — ABNORMAL HIGH (ref 65–99)
Glucose-Capillary: 123 mg/dL — ABNORMAL HIGH (ref 65–99)
Glucose-Capillary: 164 mg/dL — ABNORMAL HIGH (ref 65–99)
Glucose-Capillary: 179 mg/dL — ABNORMAL HIGH (ref 65–99)
Glucose-Capillary: 93 mg/dL (ref 65–99)

## 2015-11-21 LAB — CBC
HCT: 31.8 % — ABNORMAL LOW (ref 35.0–47.0)
Hemoglobin: 10.1 g/dL — ABNORMAL LOW (ref 12.0–16.0)
MCH: 26.9 pg (ref 26.0–34.0)
MCHC: 31.9 g/dL — ABNORMAL LOW (ref 32.0–36.0)
MCV: 84.5 fL (ref 80.0–100.0)
Platelets: 211 10*3/uL (ref 150–440)
RBC: 3.76 MIL/uL — ABNORMAL LOW (ref 3.80–5.20)
RDW: 20.4 % — ABNORMAL HIGH (ref 11.5–14.5)
WBC: 21.2 10*3/uL — ABNORMAL HIGH (ref 3.6–11.0)

## 2015-11-21 MED ORDER — GUAIFENESIN ER 600 MG PO TB12
600.0000 mg | ORAL_TABLET | Freq: Two times a day (BID) | ORAL | Status: DC
  Administered 2015-11-21 – 2015-12-01 (×17): 600 mg via ORAL
  Filled 2015-11-21 (×17): qty 1

## 2015-11-21 MED ORDER — IOHEXOL 350 MG/ML SOLN
75.0000 mL | Freq: Once | INTRAVENOUS | Status: AC | PRN
  Administered 2015-11-21: 75 mL via INTRAVENOUS

## 2015-11-21 NOTE — Progress Notes (Signed)
Post HD Tx Assessment  

## 2015-11-21 NOTE — Progress Notes (Signed)
Speech Therapy Note: MBSS rescheduled until tomorrow d/t pt receiving dialysis currently then scheduled for a CT of the chest; CXR. Rec. Pt continue w/ current dysphagia diet w/ aspiration precautions until then. Will f/u w/ MBSS tomorrow. NSG and Radiology updated.

## 2015-11-21 NOTE — Progress Notes (Signed)
Pre HD [place] Patient Checks

## 2015-11-21 NOTE — Progress Notes (Signed)
HD TX Termination

## 2015-11-21 NOTE — Progress Notes (Signed)
Subjective:  Patient seen and evaluated during hemodialysis. Appears to be tolerating well thus far. Ultrafiltration target increased to 2.5 kg.  Objective:  Vital signs in last 24 hours:  Temp:  [97.5 F (36.4 C)-97.8 F (36.6 C)] 97.8 F (36.6 C) (03/01 0940) Pulse Rate:  [70-78] 73 (03/01 1230) Resp:  [14-16] 16 (03/01 1230) BP: (128-165)/(56-67) 150/58 mmHg (03/01 1230) SpO2:  [95 %-100 %] 100 % (03/01 1230) FiO2 (%):  [28 %] 28 % (03/01 0815) Weight:  [75.6 kg (166 lb 10.7 oz)] 75.6 kg (166 lb 10.7 oz) (03/01 0940)  Weight change:  Filed Weights   11/18/15 2145 11/19/15 2100 11/21/15 0940  Weight: 73.2 kg (161 lb 6 oz) 70.5 kg (155 lb 6.8 oz) 75.6 kg (166 lb 10.7 oz)    Intake/Output:    Intake/Output Summary (Last 24 hours) at 11/21/15 1239 Last data filed at 11/20/15 1600  Gross per 24 hour  Intake      0 ml  Output      0 ml  Net      0 ml     Physical Exam: General: No acute distress  HEENT Anicteric, moist oral mucus membranes  Neck supple  Pulm/lungs Scattered rhonchi, normal effort  CV S1S2 no rubs  Abdomen:  Soft, Non tender, BS present  Extremities: Trace b/l LE edema  Neurologic: Awake, alert, follows commands  Skin: No acute rashes  Access: LUE AVF       Basic Metabolic Panel:   Recent Labs Lab 11/15/15 0549 11/16/15 0453 11/17/15 0507 11/17/15 1754 11/18/15 0519 11/19/15 0529 11/20/15 0547  NA 129* 127* 133*  --  133* 138 140  K 3.4* 3.6 2.6* 3.4* 3.0* 3.3* 3.3*  CL 92* 89* 94*  --  93* 97* 98*  CO2 27 27 30   --  30 33* 33*  GLUCOSE 146* 115* 231*  --  117* 134* 179*  BUN 33* 44* 32*  --  51* 29* 18  CREATININE 4.02* 4.54* 2.81*  --  3.45* 2.29* 1.79*  CALCIUM 7.6* 7.8* 8.0*  --  8.5* 8.5* 8.5*  MG 1.6* 1.9 1.8  --   --   --   --   PHOS 2.2* 2.0* <1.0* 2.0*  --   --  1.6*     CBC:  Recent Labs Lab 11/16/15 0453 11/17/15 0507 11/18/15 0519 11/19/15 0529 11/21/15 0445  WBC 9.8 15.2* 21.3* 25.5* 21.2*  HGB 9.3* 9.2*  9.6* 9.4* 10.1*  HCT 28.6* 28.4* 29.5* 29.3* 31.8*  MCV 83.6 82.8 82.3 83.2 84.5  PLT 81* 65* 82* 90* 211      Microbiology:  Recent Results (from the past 720 hour(s))  MRSA PCR Screening     Status: None   Collection Time: 11/13/15  9:46 PM  Result Value Ref Range Status   MRSA by PCR NEGATIVE NEGATIVE Final    Comment:        The GeneXpert MRSA Assay (FDA approved for NASAL specimens only), is one component of a comprehensive MRSA colonization surveillance program. It is not intended to diagnose MRSA infection nor to guide or monitor treatment for MRSA infections.   Culture, blood (Routine X 2) w Reflex to ID Panel     Status: None   Collection Time: 11/14/15 12:53 AM  Result Value Ref Range Status   Specimen Description BLOOD RIGHT HAND  Final   Special Requests BOTTLES DRAWN AEROBIC AND ANAEROBIC 5ML  Final   Culture NO GROWTH 6 DAYS  Final  Report Status 11/20/2015 FINAL  Final  Culture, blood (Routine X 2) w Reflex to ID Panel     Status: None   Collection Time: 11/14/15  7:14 AM  Result Value Ref Range Status   Specimen Description BLOOD RIGHT HAND  Final   Special Requests   Final    BOTTLES DRAWN AEROBIC AND ANAEROBIC AER 4ML ANA 2ML   Culture NO GROWTH 6 DAYS  Final   Report Status 11/20/2015 FINAL  Final  Urine culture     Status: None   Collection Time: 11/14/15 10:16 AM  Result Value Ref Range Status   Specimen Description URINE, RANDOM  Final   Special Requests NONE  Final   Culture NO GROWTH 1 DAY  Final   Report Status 11/15/2015 FINAL  Final  Culture, expectorated sputum-assessment     Status: None   Collection Time: 11/14/15  4:20 PM  Result Value Ref Range Status   Specimen Description SPU  Final   Special Requests NONE  Final   Sputum evaluation THIS SPECIMEN IS ACCEPTABLE FOR SPUTUM CULTURE  Final   Report Status 11/14/2015 FINAL  Final  Culture, respiratory (NON-Expectorated)     Status: None   Collection Time: 11/14/15  4:20 PM  Result  Value Ref Range Status   Specimen Description SPU  Final   Special Requests NONE Reflexed from [id number]  Final   Gram Stain   Final    MANY WBC SEEN NO SQUAMOUS EPITHELIAL CELLS PRESENT FEW GRAM POSITIVE COCCI EXCELLENT SPECIMEN - 90-100% WBCS    Culture   Final    LIGHT GROWTH ENTEROBACTER CLOACAE MODERATE GROWTH STREPTOCOCCUS PNEUMONIAE    Report Status 11/18/2015 FINAL  Final   Organism ID, Bacteria ENTEROBACTER CLOACAE  Final   Organism ID, Bacteria STREPTOCOCCUS PNEUMONIAE  Final      Susceptibility   Streptococcus pneumoniae - MIC*    ERYTHROMYCIN <=0.12 SENSITIVE Sensitive     LEVOFLOXACIN 1 SENSITIVE Sensitive     VANCOMYCIN 0.5 SENSITIVE Sensitive     PENICILLIN <=0.06 SENSITIVE Sensitive     CEFTRIAXONE <=0.12 SENSITIVE Sensitive     * MODERATE GROWTH STREPTOCOCCUS PNEUMONIAE   Enterobacter cloacae - MIC*    PIP/TAZO Value in next row Sensitive      SENSITIVE8    CEFAZOLIN Value in next row Resistant      RESISTANT>=64    CEFEPIME Value in next row Sensitive      SENSITIVE<=1    CEFTAZIDIME Value in next row Sensitive      SENSITIVE<=1    CEFTRIAXONE Value in next row Sensitive      SENSITIVE<=1    CIPROFLOXACIN Value in next row Sensitive      SENSITIVE<=0.25    GENTAMICIN Value in next row Sensitive      SENSITIVE<=1    IMIPENEM Value in next row Sensitive      SENSITIVE1    TRIMETH/SULFA Value in next row Sensitive      SENSITIVE<=20    * LIGHT GROWTH ENTEROBACTER CLOACAE  C difficile quick scan w PCR reflex     Status: Abnormal   Collection Time: 11/15/15  7:36 AM  Result Value Ref Range Status   C Diff antigen POSITIVE (A) NEGATIVE Final   C Diff toxin NEGATIVE NEGATIVE Final   C Diff interpretation   Final    Negative for toxigenic C. difficile. Toxin gene and active toxin production not detected. May be a nontoxigenic strain of C. difficile bacteria present, lacking the ability  to produce toxin.  Clostridium Difficile by PCR     Status: None    Collection Time: 11/15/15  7:36 AM  Result Value Ref Range Status   Toxigenic C Difficile by pcr NEGATIVE NEGATIVE Final    Coagulation Studies: No results for input(s): LABPROT, INR in the last 72 hours.  Urinalysis: No results for input(s): COLORURINE, LABSPEC, PHURINE, GLUCOSEU, HGBUR, BILIRUBINUR, KETONESUR, PROTEINUR, UROBILINOGEN, NITRITE, LEUKOCYTESUR in the last 72 hours.  Invalid input(s): APPERANCEUR    Imaging: No results found.   Medications:     . acidophilus  2 capsule Oral TID  . amiodarone  200 mg Oral Daily  . antiseptic oral rinse  7 mL Mouth Rinse 10 times per day  . atorvastatin  20 mg Oral QPM  . bacitracin   Topical BID  . chlorhexidine gluconate  15 mL Mouth Rinse BID  . cholecalciferol  400 Units Per Tube Daily  . citalopram  20 mg Oral Daily  . clopidogrel  75 mg Oral Daily  . guaiFENesin  600 mg Oral BID  . insulin aspart  0-15 Units Subcutaneous 6 times per day  . ipratropium-albuterol  3 mL Nebulization TID  . lipase/protease/amylase  12,000 Units Oral TID WC  . pantoprazole  40 mg Oral Q1200  . potassium chloride  40 mEq Oral Once  . vitamin B-12  1,000 mcg Oral Daily   acetaminophen **OR** acetaminophen, albuterol, alum & mag hydroxide-simeth, bisacodyl, dextrose, iohexol, nitroGLYCERIN, senna-docusate, traMADol  Assessment/ Plan:  55 y.o. 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    [facility] MWF [place]  1. End Stage Renal Disease: MWF -Patient seen during hemodialysis. He appears to be tolerating well at the moment. Complete hemodialysis treatment today and continue Monday, Wednesday, Friday schedule.  2. Anemia of chronic kidney disease:  - Hgb up to 10.1 holding off on epogen given right renal masses x 2.  3.  Acute resp  failure - combination of Pneumonia, Anasarca, large pleural effusions -  We are planning additional ultrafiltration of 2.5 kg today.   4.  Hypokalemia - K remains low at 3.3, should improve with dialysis.   5. Gastroparesis, Nausea and vomiting - symptomatic management  6.  Renal mass x 2.  Suggestive of small and early RCC.  Urology has been consulted and we are awaiting their input.        LOS:  Ritu Gagliardo 3/1/201712:39 PM

## 2015-11-21 NOTE — Progress Notes (Signed)
HD Tx Initiated

## 2015-11-21 NOTE — Progress Notes (Signed)
RN has noticed that pt is having blood in her sputum, Dr. Posey Pronto was notified. New orders were placed by Doctor Posey Pronto, CT angio to rule out PE, and to collect sputum specimen. Will continue to monitor pt.   Angus Seller

## 2015-11-21 NOTE — Progress Notes (Signed)
[name] MRN: [id number] DOB: [birth year]   Cancelled Treatment:    Reason Eval/Treat Not Completed: Other (comment).  Pt pending angio chest CT to rule out PE.  Will hold PT at this time and re-attempt at a later date and time as medically appropriate.  Mittie Bodo, SPT Mittie Bodo 11/21/2015, 2:50 PM

## 2015-11-21 NOTE — Progress Notes (Signed)
Pre HD TX Assessment

## 2015-11-22 ENCOUNTER — Inpatient Hospital Stay: Payer: Medicare Other

## 2015-11-22 LAB — INFLUENZA PANEL BY PCR (TYPE A & B)
H1N1 flu by pcr: NOT DETECTED
Influenza A By PCR: NEGATIVE
Influenza B By PCR: NEGATIVE

## 2015-11-22 LAB — BASIC METABOLIC PANEL
Anion gap: 6 (ref 5–15)
BUN: 17 mg/dL (ref 6–20)
CO2: 34 mmol/L — ABNORMAL HIGH (ref 22–32)
Calcium: 8.3 mg/dL — ABNORMAL LOW (ref 8.9–10.3)
Chloride: 100 mmol/L — ABNORMAL LOW (ref 101–111)
Creatinine, Ser: 2.34 mg/dL — ABNORMAL HIGH (ref 0.44–1.00)
GFR calc Af Amer: 26 mL/min — ABNORMAL LOW (ref >60)
GFR calc non Af Amer: 22 mL/min — ABNORMAL LOW (ref >60)
Glucose, Bld: 150 mg/dL — ABNORMAL HIGH (ref 65–99)
Potassium: 4.5 mmol/L (ref 3.5–5.1)
Sodium: 140 mmol/L (ref 135–145)

## 2015-11-22 LAB — GLUCOSE, CAPILLARY
Glucose-Capillary: 116 mg/dL — ABNORMAL HIGH (ref 65–99)
Glucose-Capillary: 132 mg/dL — ABNORMAL HIGH (ref 65–99)
Glucose-Capillary: 136 mg/dL — ABNORMAL HIGH (ref 65–99)
Glucose-Capillary: 160 mg/dL — ABNORMAL HIGH (ref 65–99)
Glucose-Capillary: 51 mg/dL — ABNORMAL LOW (ref 65–99)
Glucose-Capillary: 65 mg/dL (ref 65–99)
Glucose-Capillary: 65 mg/dL (ref 65–99)
Glucose-Capillary: 95 mg/dL (ref 65–99)

## 2015-11-22 LAB — CBC
HCT: 30.1 % — ABNORMAL LOW (ref 35.0–47.0)
Hemoglobin: 9.8 g/dL — ABNORMAL LOW (ref 12.0–16.0)
MCH: 27 pg (ref 26.0–34.0)
MCHC: 32.6 g/dL (ref 32.0–36.0)
MCV: 82.8 fL (ref 80.0–100.0)
Platelets: 245 10*3/uL (ref 150–440)
RBC: 3.64 MIL/uL — ABNORMAL LOW (ref 3.80–5.20)
RDW: 20.8 % — ABNORMAL HIGH (ref 11.5–14.5)
WBC: 15.8 10*3/uL — ABNORMAL HIGH (ref 3.6–11.0)

## 2015-11-22 MED ORDER — LEVOFLOXACIN IN D5W 500 MG/100ML IV SOLN
500.0000 mg | INTRAVENOUS | Status: DC
  Administered 2015-11-24 – 2015-11-28 (×3): 500 mg via INTRAVENOUS
  Filled 2015-11-22 (×4): qty 100

## 2015-11-22 MED ORDER — DEXTROSE 50 % IV SOLN
1.0000 | Freq: Once | INTRAVENOUS | Status: AC
  Administered 2015-11-22: 50 mL via INTRAVENOUS
  Filled 2015-11-22: qty 50

## 2015-11-22 MED ORDER — INSULIN ASPART 100 UNIT/ML ~~LOC~~ SOLN: 0.0000 [IU] | Freq: Every day | SUBCUTANEOUS | Status: DC

## 2015-11-22 MED ORDER — INSULIN ASPART 100 UNIT/ML ~~LOC~~ SOLN
0.0000 [IU] | Freq: Three times a day (TID) | SUBCUTANEOUS | Status: DC
  Administered 2015-11-22: 1 [IU] via SUBCUTANEOUS
  Administered 2015-11-22 – 2015-11-27 (×3): 2 [IU] via SUBCUTANEOUS
  Filled 2015-11-22: qty 1
  Filled 2015-11-22: qty 7
  Filled 2015-11-22: qty 1
  Filled 2015-11-22 (×3): qty 2

## 2015-11-22 MED ORDER — LEVOFLOXACIN IN D5W 750 MG/150ML IV SOLN
750.0000 mg | Freq: Once | INTRAVENOUS | Status: AC
  Administered 2015-11-22: 750 mg via INTRAVENOUS
  Filled 2015-11-22: qty 150

## 2015-11-22 NOTE — Progress Notes (Cosign Needed)
Speech Language Pathology Treatment: Dysphagia  Patient Details Name: [name] MRN: [id number] DOB: [birth year] Today's Date: 11/22/2015 Time: 1445-1530 SLP Time Calculation (min) (ACUTE ONLY): 45 min  Assessment / Plan / Recommendation Clinical Impression  Pt appeared to tolerate trials of the rec'd Nectar consistency liquids w/ no overt s/s of aspiration during trials at bedside this PM. Pt exhibited toleration of trials via cup; required min.+ verbal cues and model to follow through w/ strategies of f/u (2nd) swallowing w/ trials and throat clear/cough at end of po's as well as aspiration precautions of using small, single bites and sips. Pt was able to id the handouts/signs posted in room re: swallowing strategies and precautions. Discussed the upgrade in diet to a Level 2 and the need to fully masticate foods of increased textures b/f swallowing. Pt is fairly independent w/ feeding self but would benefit from intermittent monitoring for follow through w/ aspiration precautions and strategies. Rec. Dys. Level 2 diet w/ Nectar liquids; compensatory swallowing strategies, and aspiration precautions to lessen risk for aspiration. MD consulted and MBSS were discussed including pt's risk for aspiration and decline in respiratory status; need for strict aspiration precautions and modified diet at this time. MD will monitor pt's respiratory status for any decline. Rec. Continue Dietician f/u for supplements. NSG made aware of precautions w/ oral intake.   HPI HPI: Pt is a 55 y.o. female with a known history of ESRD, CAD and Asacol failure who presents with abdominal pain, nausea and vomiting for 2-3 days. Patient is also complaining of shortness of breath. Patient did not go for dialysis(MWF) today because of abdominal pain. CT of the abdomen ordered in the emergency room showed no acute pathology. Patient reports that her granddaughters have been sick. Pt exhibited s/s of aspiration w/ po trials during BSE  yesterday and was placed on a Dys. 1 w/ Nectar liquids diet; adequate toleration of current diet per NSG report. Pt has exhibited s/s of aspiration at f/u tx session, unable to tolerate trials of thin liquids w/out coughing. Of note, pt was orally intubated emergently post admission d/t respiratory decline then was extubated 11/18/15 - 5 days intubated. Suspect intubation could have had an impact on pharyngeal-laryngeal areas. MBSS completed earlier today indicating oropharyngeal phase dypshagia and Esophageal phase dysmotility - see results for details.        SLP Plan  Continue with current plan of care     Recommendations  Diet recommendations: Dysphagia 2 (fine chop);Nectar-thick liquid Liquids provided via: Cup Medication Administration: Crushed with puree Supervision: Patient able to self feed;Intermittent supervision to cue for compensatory strategies (at this time) Compensations: Minimize environmental distractions;Slow rate;Small sips/bites;Multiple dry swallows after each bite/sip;Follow solids with liquid;Clear throat intermittently (hard cough at end of meals) Postural Changes and/or Swallow Maneuvers: Seated upright 90 degrees             Oral Care Recommendations: Oral care BID;Staff/trained caregiver to provide oral care Follow up Recommendations: Skilled Nursing facility Plan: Continue with current plan of care     Seymour, [place], CCC-SLP  [name],[name] 11/22/2015, 4:18 PM

## 2015-11-22 NOTE — Progress Notes (Signed)
[facility] [place] NAME: [name]    MR#:  [id number]  DATE OF BIRTH:  July 24, 1961  CHIEF COMPLAINT:   Chief Complaint  Patient presents with  . Nausea  . Emesis    Patient complains of chest pain  REVIEW OF SYSTEMS:    Review of Systems   CONSTITUTIONAL: No documented fever. Positive fatigue and weakness. No weight gain, no weight loss.  EYES: No blurry or double vision.  ENT: No tinnitus. No postnasal drip. No redness of the oropharynx.  RESPIRATORY: No cough, no wheeze, no hemoptysis. Positive dyspnea.  CARDIOVASCULAR: No chest pain. No orthopnea. No palpitations. No syncope.  GASTROINTESTINAL: No nausea, no vomiting or diarrhea. No abdominal pain. No melena or hematochezia.  GENITOURINARY:  No urgency. No frequency. No dysuria. No hematuria. No obstructive symptoms. No discharge. No pain. No significant abnormal bleeding ENDOCRINE: No polyuria or nocturia. No heat or cold intolerance.  HEMATOLOGY: No anemia. No bruising. No bleeding. No purpura. No petechiae INTEGUMENTARY: No rashes. No lesions.  MUSCULOSKELETAL: No arthritis. No swelling. No gout.  NEUROLOGIC: No numbness, tingling, or ataxia. No seizure-type activity.  PSYCHIATRIC: No anxiety. No insomnia. No ADD.         DRUG ALLERGIES:   Allergies  Allergen Reactions  . Compazine [Prochlorperazine Edisylate] Anaphylaxis and Nausea And Vomiting  . Ace Inhibitors Swelling  . Ativan [Lorazepam] Other (See Comments)    Reaction:  Hallucinations and headaches  . Codeine Nausea And Vomiting  . Gabapentin Other (See Comments)    Reaction:  Unknown   . Losartan Other (See Comments)    Reaction:  Unknown   . Ondansetron Other (See Comments)    Reaction:  Unknown   . Prochlorperazine Other (See Comments)    Reaction:  Unknown   . Scopolamine Other (See Comments)    Reaction:  Unknown   . Zofran [Ondansetron Hcl] Other (See Comments)    Reaction:   hallucinations   . Oxycodone Anxiety  . Tape Rash    VITALS:  Blood pressure 99/76, pulse 75, temperature 97.8 F (36.6 C), temperature source Oral, resp. rate 16, height 5\' 5"  (1.651 m), weight 73.7 kg (162 lb 7.7 oz), SpO2 100 %.  PHYSICAL EXAMINATION:   Physical Exam  GENERAL:  55 y.o.-year-old patient lying in the bed  chronically ill-appearing in no acute distress EYES: Pupils equal, round, reactive to light and accommodation. No scleral icterus. HEENT: Head atraumatic, normocephalic.  NECK:  Supple, no jugular venous distention. No thyroid enlargement, no tenderness.  LUNGS: , On a mechanical ventilator diminished breath sounds bilaterally without any rales rhonchi wheezing  CARDIOVASCULAR: S1, S2 normal. No murmurs, rubs, or gallops.  ABDOMEN: Soft, nontender, nondistended. Bowel sounds present. No organomegaly or mass.  EXTREMITIES: Right hand edema. NEUROLOGIC:   Not oriented to place or time, moves all extremities spontaneously PSYCHIATRIC: not anxious  SKIN: No obvious rash, lesion, or ulcer.    LABORATORY PANEL:   CBC  Recent Labs Lab 11/22/15 0540  WBC 15.8*  HGB 9.8*  HCT 30.1*  PLT 245   ------------------------------------------------------------------------------------------------------------------  Chemistries   Recent Labs Lab 11/17/15 0507  11/22/15 0540  NA 133*  < > 140  K 2.6*  < > 4.5  CL 94*  < > 100*  CO2 30  < > 34*  GLUCOSE 231*  < > 150*  BUN 32*  < > 17  CREATININE 2.81*  < > 2.34*  CALCIUM 8.0*  < >  8.3*  MG 1.8  --   --   < > = values in this interval not displayed. ------------------------------------------------------------------------------------------------------------------  Cardiac Enzymes No results for input(s): TROPONINI in the last 168 hours. ------------------------------------------------------------------------------------------------------------------  RADIOLOGY:  Ct Angio Chest Pe W/cm &/or Wo  Cm  11/21/2015  CLINICAL DATA:  Blood in sputum. Concern for pulmonary embolism. Dialysis patient. COPD, asthma, and coronary disease. EXAM: CT ANGIOGRAPHY CHEST WITH CONTRAST TECHNIQUE: Multidetector CT imaging of the chest was performed using the standard protocol during bolus administration of intravenous contrast. Multiplanar CT image reconstructions and MIPs were obtained to evaluate the vascular anatomy. CONTRAST:  36mL OMNIPAQUE IOHEXOL 350 MG/ML SOLN COMPARISON:  Chest CT 10/10/2015 FINDINGS: Mediastinum/Nodes: No filling defects within the pulmonary to suggest acute pulmonary embolism. No acute findings aorta or great vessels. No pericardial effusion. Mild mediastinal lymphadenopathy. No axillary or supraclavicular adenopathy. Lungs/Pleura: Large bilateral layering pleural effusions. Passive atelectasis of the LEFT and RIGHT lower lobe. There is patchy airspace disease in the RIGHT upper lobe which reaches confluence at the apex with masslike consolidation measuring 3.5 by 2.6 cm (image 20, series 6). Some patchy airspace disease in the inferior lingula additionally. Upper abdomen: Limited view of the liver, kidneys, pancreas are unremarkable. Normal adrenal glands. Musculoskeletal: Diffuse sclerosis the bones.  Acute findings. Review of the MIP images confirms the above findings. IMPRESSION: 1. No acute pulmonary embolism. 2. Large bilateral pleural effusions and lower lobe atelectasis. 3. Bilateral patchy airspace disease suggesting MULTIFOCAL PNEUMONIA versus aspiration pneumonitis. 4. Consolidation in the RIGHT upper low most suggestive of pulmonary infection. 5. Mild mediastinal adenopathy favored reactive. Electronically Signed   By: Suzy Bouchard M.D.   On: 11/21/2015 15:17     ASSESSMENT AND PLAN:     1. Acute respiratory failure secondary to pneumonia, pulmonary edema:  Status post extubation. Continue  Unasyn and vancomycin as been discontinued May need a CT scan of the chest tomorrow  if no improvement  #2. Septic shock: Secondary to C. difficile, pneumonia:  WBC is trending up  Electrolytes are being followed and repleted #3 severe electrolyte imbalance ; currently being replaced per pharmacy consult and nephrology. Repeat phosphorus level in the morning  #4 gastroparesis secondary to diabetes mellitus type 2: Continue Phenergan, Reglan.  #5 Diabetes mellitus type 2 with hypoglycemia:  hypoglycemia improved. Hypoglycemia due to sepsis.    #6 acute on chronic diastolic heart failure in setting of fall ESRD: Continue dialysis as per nephrology recommendation  #7 Coronary artery disease patient is on Plavix, Coreg, amiodarone continue  #8 chronic pancreatitis: Continue Creon  #9 right arm edema, exclude DVT: No evidence of DVT  #10 thrombocytopenia: Platelets have improved   #11.  H/o c.diff  + c.diff antifen Patient will be transferred to the floor. Physical therapy speech eval will be done.  All the records are reviewed and case discussed with Care Management/Social Workerr. Management plans discussed with the patient, family and they are in agreement.  CODE STATUS:full  TOTAL TIME TAKING CARE OF THIS PATIENT:  25 minutes of follow-up time spent Dustin Flock M.D on 11/22/2015 at 12:11 PM  Between 7am to 6pm - Pager - [phone]  After 6pm go to [email or web address] - password [email or web address] [facility]  Office  [phone]  CC: Primary care physician; Ellamae Sia, MD

## 2015-11-22 NOTE — Progress Notes (Signed)
Pt alert, needs cueing to keep nasal cannula on, as she tried several times to take it off today.   Spoke with Valeta Harms, who verified/confirmed removal of contact precautions due to pt lab antigen/toxin results in relation to C-diff. Contact precautions discontinued. Continue to assess.

## 2015-11-22 NOTE — Progress Notes (Signed)
Pt was eating lunch, stated her chest was hurting. Dr. Candiss Norse aware. EKG was ordered, pt was NSR. Pt placed on telemetry, NSR. Concerns addressed. No further complaints of chest pain noted.

## 2015-11-22 NOTE — Progress Notes (Signed)
Subjective:  Pt seen at bedside.  Still having some chest discomfort Due for dialysis again tomorrow.   Objective:  Vital signs in last 24 hours:  Temp:  [97.8 F (36.6 C)-98.2 F (36.8 C)] 97.8 F (36.6 C) (03/02 0511) Pulse Rate:  [75-88] 80 (03/02 1617) Resp:  [16-24] 16 (03/02 0511) BP: (99-171)/(53-76) 99/76 mmHg (03/02 0511) SpO2:  [78 %-100 %] 78 % (03/02 1617)  Weight change:  Filed Weights   11/19/15 2100 11/21/15 0940 11/21/15 1305  Weight: 70.5 kg (155 lb 6.8 oz) 75.6 kg (166 lb 10.7 oz) 73.7 kg (162 lb 7.7 oz)    Intake/Output:    Intake/Output Summary (Last 24 hours) at 11/22/15 1649 Last data filed at 11/22/15 0410  Gross per 24 hour  Intake    120 ml  Output      0 ml  Net    120 ml     Physical Exam: General: No acute distress  HEENT Anicteric, dry oral mucus membranes  Neck supple  Pulm/lungs Scattered rhonchi, normal effort  CV S1S2 no rubs  Abdomen:  Soft, Non tender, BS present  Extremities: Trace b/l LE edema  Neurologic: Awake, alert, follows commands  Skin: No acute rashes  Access: LUE AVF       Basic Metabolic Panel:   Recent Labs Lab 11/16/15 0453 11/17/15 0507 11/17/15 1754 11/18/15 0519 11/19/15 0529 11/20/15 0547 11/22/15 0540  NA 127* 133*  --  133* 138 140 140  K 3.6 2.6* 3.4* 3.0* 3.3* 3.3* 4.5  CL 89* 94*  --  93* 97* 98* 100*  CO2 27 30  --  30 33* 33* 34*  GLUCOSE 115* 231*  --  117* 134* 179* 150*  BUN 44* 32*  --  51* 29* 18 17  CREATININE 4.54* 2.81*  --  3.45* 2.29* 1.79* 2.34*  CALCIUM 7.8* 8.0*  --  8.5* 8.5* 8.5* 8.3*  MG 1.9 1.8  --   --   --   --   --   PHOS 2.0* <1.0* 2.0*  --   --  1.6*  --      CBC:  Recent Labs Lab 11/17/15 0507 11/18/15 0519 11/19/15 0529 11/21/15 0445 11/22/15 0540  WBC 15.2* 21.3* 25.5* 21.2* 15.8*  HGB 9.2* 9.6* 9.4* 10.1* 9.8*  HCT 28.4* 29.5* 29.3* 31.8* 30.1*  MCV 82.8 82.3 83.2 84.5 82.8  PLT 65* 82* 90* 211 245      Microbiology:  Recent Results  (from the past 720 hour(s))  MRSA PCR Screening     Status: None   Collection Time: 11/13/15  9:46 PM  Result Value Ref Range Status   MRSA by PCR NEGATIVE NEGATIVE Final    Comment:        The GeneXpert MRSA Assay (FDA approved for NASAL specimens only), is one component of a comprehensive MRSA colonization surveillance program. It is not intended to diagnose MRSA infection nor to guide or monitor treatment for MRSA infections.   Culture, blood (Routine X 2) w Reflex to ID Panel     Status: None   Collection Time: 11/14/15 12:53 AM  Result Value Ref Range Status   Specimen Description BLOOD RIGHT HAND  Final   Special Requests BOTTLES DRAWN AEROBIC AND ANAEROBIC 5ML  Final   Culture NO GROWTH 6 DAYS  Final   Report Status 11/20/2015 FINAL  Final  Culture, blood (Routine X 2) w Reflex to ID Panel     Status: None   Collection  Time: 11/14/15  7:14 AM  Result Value Ref Range Status   Specimen Description BLOOD RIGHT HAND  Final   Special Requests   Final    BOTTLES DRAWN AEROBIC AND ANAEROBIC AER 4ML ANA 2ML   Culture NO GROWTH 6 DAYS  Final   Report Status 11/20/2015 FINAL  Final  Urine culture     Status: None   Collection Time: 11/14/15 10:16 AM  Result Value Ref Range Status   Specimen Description URINE, RANDOM  Final   Special Requests NONE  Final   Culture NO GROWTH 1 DAY  Final   Report Status 11/15/2015 FINAL  Final  Culture, expectorated sputum-assessment     Status: None   Collection Time: 11/14/15  4:20 PM  Result Value Ref Range Status   Specimen Description SPU  Final   Special Requests NONE  Final   Sputum evaluation THIS SPECIMEN IS ACCEPTABLE FOR SPUTUM CULTURE  Final   Report Status 11/14/2015 FINAL  Final  Culture, respiratory (NON-Expectorated)     Status: None   Collection Time: 11/14/15  4:20 PM  Result Value Ref Range Status   Specimen Description SPU  Final   Special Requests NONE Reflexed from [id number]  Final   Gram Stain   Final    MANY WBC  SEEN NO SQUAMOUS EPITHELIAL CELLS PRESENT FEW GRAM POSITIVE COCCI EXCELLENT SPECIMEN - 90-100% WBCS    Culture   Final    LIGHT GROWTH ENTEROBACTER CLOACAE MODERATE GROWTH STREPTOCOCCUS PNEUMONIAE    Report Status 11/18/2015 FINAL  Final   Organism ID, Bacteria ENTEROBACTER CLOACAE  Final   Organism ID, Bacteria STREPTOCOCCUS PNEUMONIAE  Final      Susceptibility   Streptococcus pneumoniae - MIC*    ERYTHROMYCIN <=0.12 SENSITIVE Sensitive     LEVOFLOXACIN 1 SENSITIVE Sensitive     VANCOMYCIN 0.5 SENSITIVE Sensitive     PENICILLIN <=0.06 SENSITIVE Sensitive     CEFTRIAXONE <=0.12 SENSITIVE Sensitive     * MODERATE GROWTH STREPTOCOCCUS PNEUMONIAE   Enterobacter cloacae - MIC*    PIP/TAZO Value in next row Sensitive      SENSITIVE8    CEFAZOLIN Value in next row Resistant      RESISTANT>=64    CEFEPIME Value in next row Sensitive      SENSITIVE<=1    CEFTAZIDIME Value in next row Sensitive      SENSITIVE<=1    CEFTRIAXONE Value in next row Sensitive      SENSITIVE<=1    CIPROFLOXACIN Value in next row Sensitive      SENSITIVE<=0.25    GENTAMICIN Value in next row Sensitive      SENSITIVE<=1    IMIPENEM Value in next row Sensitive      SENSITIVE1    TRIMETH/SULFA Value in next row Sensitive      SENSITIVE<=20    * LIGHT GROWTH ENTEROBACTER CLOACAE  C difficile quick scan w PCR reflex     Status: Abnormal   Collection Time: 11/15/15  7:36 AM  Result Value Ref Range Status   C Diff antigen POSITIVE (A) NEGATIVE Final   C Diff toxin NEGATIVE NEGATIVE Final   C Diff interpretation   Final    Negative for toxigenic C. difficile. Toxin gene and active toxin production not detected. May be a nontoxigenic strain of C. difficile bacteria present, lacking the ability to produce toxin.  Clostridium Difficile by PCR     Status: None   Collection Time: 11/15/15  7:36 AM  Result Value Ref  Range Status   Toxigenic C Difficile by pcr NEGATIVE NEGATIVE Final    Coagulation  Studies: No results for input(s): LABPROT, INR in the last 72 hours.  Urinalysis: No results for input(s): COLORURINE, LABSPEC, PHURINE, GLUCOSEU, HGBUR, BILIRUBINUR, KETONESUR, PROTEINUR, UROBILINOGEN, NITRITE, LEUKOCYTESUR in the last 72 hours.  Invalid input(s): APPERANCEUR    Imaging: Ct Angio Chest Pe W/cm &/or Wo Cm  11/21/2015  CLINICAL DATA:  Blood in sputum. Concern for pulmonary embolism. Dialysis patient. COPD, asthma, and coronary disease. EXAM: CT ANGIOGRAPHY CHEST WITH CONTRAST TECHNIQUE: Multidetector CT imaging of the chest was performed using the standard protocol during bolus administration of intravenous contrast. Multiplanar CT image reconstructions and MIPs were obtained to evaluate the vascular anatomy. CONTRAST:  27mL OMNIPAQUE IOHEXOL 350 MG/ML SOLN COMPARISON:  Chest CT 10/10/2015 FINDINGS: Mediastinum/Nodes: No filling defects within the pulmonary to suggest acute pulmonary embolism. No acute findings aorta or great vessels. No pericardial effusion. Mild mediastinal lymphadenopathy. No axillary or supraclavicular adenopathy. Lungs/Pleura: Large bilateral layering pleural effusions. Passive atelectasis of the LEFT and RIGHT lower lobe. There is patchy airspace disease in the RIGHT upper lobe which reaches confluence at the apex with masslike consolidation measuring 3.5 by 2.6 cm (image 20, series 6). Some patchy airspace disease in the inferior lingula additionally. Upper abdomen: Limited view of the liver, kidneys, pancreas are unremarkable. Normal adrenal glands. Musculoskeletal: Diffuse sclerosis the bones.  Acute findings. Review of the MIP images confirms the above findings. IMPRESSION: 1. No acute pulmonary embolism. 2. Large bilateral pleural effusions and lower lobe atelectasis. 3. Bilateral patchy airspace disease suggesting MULTIFOCAL PNEUMONIA versus aspiration pneumonitis. 4. Consolidation in the RIGHT upper low most suggestive of pulmonary infection. 5. Mild  mediastinal adenopathy favored reactive. Electronically Signed   By: Suzy Bouchard M.D.   On: 11/21/2015 15:17     Medications:     . acidophilus  2 capsule Oral TID  . amiodarone  200 mg Oral Daily  . antiseptic oral rinse  7 mL Mouth Rinse 10 times per day  . atorvastatin  20 mg Oral QPM  . bacitracin   Topical BID  . chlorhexidine gluconate  15 mL Mouth Rinse BID  . cholecalciferol  400 Units Per Tube Daily  . citalopram  20 mg Oral Daily  . clopidogrel  75 mg Oral Daily  . guaiFENesin  600 mg Oral BID  . insulin aspart  0-5 Units Subcutaneous QHS  . insulin aspart  0-9 Units Subcutaneous TID WC  . ipratropium-albuterol  3 mL Nebulization TID  . lipase/protease/amylase  12,000 Units Oral TID WC  . pantoprazole  40 mg Oral Q1200  . potassium chloride  40 mEq Oral Once  . vitamin B-12  1,000 mcg Oral Daily   acetaminophen **OR** acetaminophen, albuterol, alum & mag hydroxide-simeth, bisacodyl, dextrose, iohexol, nitroGLYCERIN, senna-docusate, traMADol  Assessment/ Plan:  55 y.o. 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    [facility] MWF [place]  1. End Stage Renal Disease: MWF -Pt seen at bedside, no acute indication for HD today, will plan for HD tomorrow to get her back on schedule  2. Anemia of chronic kidney disease:  - Hgb 9.8, holding off on epogen given right renal masses x 2.  3.  Acute resp failure - combination of Pneumonia, Anasarca, large pleural effusions -  Doing better now  4.  Hypokalemia - K  Normal now  5. Gastroparesis, Nausea and vomiting - symptomatic management  6.  Renal mass x 2.  Suggestive of small and early RCC.   Patient will need to f/u with urology.        LOS:  Murlean Iba 3/2/20174:49 PM

## 2015-11-22 NOTE — Progress Notes (Signed)
Pharmacy Antibiotic Note  [name] is a 55 y.o. female admitted on 11/12/2015 with pneumonia.  Pharmacy has been consulted for levofloxacin dosing.  Plan: Levofloxacin 750 mg IV x1 then 500 mg IV q48h in this dialysis pt.   Height: 5\' 5"  (165.1 cm) Weight: 162 lb 7.7 oz (73.7 kg) IBW/kg (Calculated) : 57  Temp (24hrs), Avg:98 F (36.7 C), Min:97.8 F (36.6 C), Max:98.2 F (36.8 C)   Recent Labs Lab 11/17/15 0507 11/18/15 0519 11/19/15 0529 11/20/15 0547 11/21/15 0445 11/22/15 0540  WBC 15.2* 21.3* 25.5*  --  21.2* 15.8*  CREATININE 2.81* 3.45* 2.29* 1.79*  --  2.34*    Estimated Creatinine Clearance: 27 mL/min (by C-G formula based on Cr of 2.34).    Allergies  Allergen Reactions  . Compazine [Prochlorperazine Edisylate] Anaphylaxis and Nausea And Vomiting  . Ace Inhibitors Swelling  . Ativan [Lorazepam] Other (See Comments)    Reaction:  Hallucinations and headaches  . Codeine Nausea And Vomiting  . Gabapentin Other (See Comments)    Reaction:  Unknown   . Losartan Other (See Comments)    Reaction:  Unknown   . Ondansetron Other (See Comments)    Reaction:  Unknown   . Prochlorperazine Other (See Comments)    Reaction:  Unknown   . Scopolamine Other (See Comments)    Reaction:  Unknown   . Zofran [Ondansetron Hcl] Other (See Comments)    Reaction:  hallucinations   . Oxycodone Anxiety  . Tape Rash     Thank you for allowing pharmacy to be a part of this patient's care.  Rocky Morel 11/22/2015 5:17 PM

## 2015-11-22 NOTE — Plan of Care (Signed)
Problem: SLP Dysphagia Goals Goal: Misc Dysphagia Goal Pt will safely tolerate po diet of least restrictive consistency w/ no overt s/s of aspiration noted by Staff/pt/family x3 sessions.    

## 2015-11-22 NOTE — Care Management Note (Signed)
Patient is active at [facility] on MWF.  Clinic is aware of admission and will update with additional medical records at her discharge.  [name]  Dialysis Coordinator  [phone]

## 2015-11-22 NOTE — Progress Notes (Signed)
Physical Therapy Treatment Patient Details Name: NIYOMI PARK MRN: [id number] DOB: 1960-10-04 Today's Date: 11/22/2015    History of Present Illness Oniya Loera is a 55 y.o. female with a known history of ESRD, CAD and Asacol failure who presents with abdominal pain, nausea and vomiting for the past day. Patient is also complaining of shortness of breath. Patient did not go for dialysis today because of abdominal pain. CT of the abdomen ordered in the emergency room showed no acute pathology. Patient reports that her granddaughters have been sick. Pt is AOx3 at time of evaluation but disoriented to situation. Poor short term memory and unable to provide extensive details regarding prior function a living situation. Significant portion of history obtained from medical record    PT Comments    Upon entering room, pt's nasal cannula was off.  Pt's vitals were O2 saturation: 78% O2 (room air), HR: 80 bpm.  Nursing was notified immediately.   Nasal cannula was reapplied (2 L/min O2), pt's vital became [facility].  Pt was min assist x2 for bed mobility.  Pt was mod assist x2 for 2 sets of sit to stand with RW.  Pt stood for 1 minute each set.  Activity limited due to fatigue.  Next session PT will attempt ambulation and endurance training as appropriate.   Follow Up Recommendations  SNF     Equipment Recommendations  None recommended by PT    Recommendations for Other Services       Precautions / Restrictions Precautions Precautions: Fall Restrictions Weight Bearing Restrictions: No    Mobility  Bed Mobility Overal bed mobility: Needs Assistance Bed Mobility: Supine to Sit;Sit to Supine     Supine to sit: +2 for physical assistance;Min assist Sit to supine: +2 for physical assistance;Min assist   General bed mobility comments: required extra time and VC and tactile cues for foot and hand placment   Transfers Overall transfer level: Needs assistance Equipment used: Rolling walker (2  wheeled) Transfers: Sit to/from Stand (2 sets ) Sit to Stand: Mod assist;+2 physical assistance         General transfer comment: required extra time and VC and tactile cues for foot and hand placment  Ambulation/Gait      Attempted to march in place with RW; however pt was not able to complete task.           Stairs            Wheelchair Mobility    Modified Rankin (Stroke Patients Only)       Balance Overall balance assessment: Needs assistance Sitting-balance support: Feet supported Sitting balance-Leahy Scale: Fair Sitting balance - Comments: Pt able to maintain upright sitting balance without assist. Tolerates minimal challenge to balance. Poor cognition   Standing balance support: Bilateral upper extremity supported (RW, stood for 2 sets, 1 min each ) Standing balance-Leahy Scale: Fair                      Cognition Arousal/Alertness: Awake/alert Behavior During Therapy: Flat affect Overall Cognitive Status: No family/caregiver present to determine baseline cognitive functioning Area of Impairment: Orientation Orientation Level: Disoriented to;Situation                  Exercises      General Comments   Nursing was contacted and cleared pt for physical therapy.  Pt was agreeable and session was modified due to fatigue. Nursing was present at beginning of session due to O2 desaturation.  Pertinent Vitals/Pain Pain Assessment: No/denies pain    Home Living                      Prior Function            PT Goals (current goals can now be found in the care plan section) Acute Rehab PT Goals PT Goal Formulation: Patient unable to participate in goal setting Potential to Achieve Goals: Fair Progress towards PT goals: Progressing toward goals    Frequency  Min 2X/week    PT Plan      Co-evaluation             End of Session Equipment Utilized During Treatment: Gait belt;Oxygen (2 L/min) Activity Tolerance:  Patient limited by fatigue Patient left: in bed;with call bell/phone within reach;with bed alarm set     Time: 1543-1610 PT Time Calculation (min) (ACUTE ONLY): 27 min  Charges:                       G Codes:      Mittie Bodo, SPT Mittie Bodo 11/22/2015, 4:22 PM

## 2015-11-22 NOTE — Progress Notes (Signed)
Pts CBG was 51. On call doctor contacted. CBG reviewed with Dr. Estanislado Pandy. Doctor order an ampule of D 50, a serving of orange juice, BS check within one hour, and for her sliding scale to be discontinued until the afternoon as to observe her blood sugars on the continued q 4 hour order.

## 2015-11-22 NOTE — Progress Notes (Signed)
[place] [facility] [place] NAME: [name]    MR#:  [id number]  DATE OF BIRTH:  1961/02/01  SUBJECTIVE:   No acute issues.  REVIEW OF SYSTEMS:    Review of Systems  Constitutional: Positive for malaise/fatigue. Negative for fever and chills.  HENT: Negative for sore throat.   Eyes: Negative for blurred vision.  Respiratory: Negative for cough, hemoptysis, shortness of breath and wheezing.   Cardiovascular: Negative for chest pain, palpitations and leg swelling.  Gastrointestinal: Negative for nausea, vomiting, abdominal pain, diarrhea and blood in stool.  Genitourinary: Negative for dysuria.  Musculoskeletal: Negative for back pain.  Neurological: Positive for weakness. Negative for dizziness, tremors, sensory change, speech change, focal weakness and headaches.  Endo/Heme/Allergies: Does not bruise/bleed easily.  Psychiatric/Behavioral: Negative for depression and hallucinations.    Tolerating Diet: Yes      DRUG ALLERGIES:   Allergies  Allergen Reactions  . Compazine [Prochlorperazine Edisylate] Anaphylaxis and Nausea And Vomiting  . Ace Inhibitors Swelling  . Ativan [Lorazepam] Other (See Comments)    Reaction:  Hallucinations and headaches  . Codeine Nausea And Vomiting  . Gabapentin Other (See Comments)    Reaction:  Unknown   . Losartan Other (See Comments)    Reaction:  Unknown   . Ondansetron Other (See Comments)    Reaction:  Unknown   . Prochlorperazine Other (See Comments)    Reaction:  Unknown   . Scopolamine Other (See Comments)    Reaction:  Unknown   . Zofran [Ondansetron Hcl] Other (See Comments)    Reaction:  hallucinations   . Oxycodone Anxiety  . Tape Rash    VITALS:  Blood pressure 99/76, pulse 75, temperature 97.8 F (36.6 C), temperature source Oral, resp. rate 16, height 5\' 5"  (1.651 m), weight 73.7 kg (162 lb 7.7 oz), SpO2 100 %.  PHYSICAL EXAMINATION:   Physical Exam  Constitutional:  She is oriented to person, place, and time and well-developed, well-nourished, and in no distress. No distress.  HENT:  Head: Normocephalic.  Eyes: No scleral icterus.  Neck: Normal range of motion. Neck supple. No JVD present. No tracheal deviation present.  Cardiovascular: Normal rate, regular rhythm and normal heart sounds.  Exam reveals no gallop and no friction rub.   No murmur heard. Pulmonary/Chest: Effort normal and breath sounds normal. No respiratory distress. She has no wheezes. She has no rales. She exhibits no tenderness.  Abdominal: Soft. Bowel sounds are normal. She exhibits no distension and no mass. There is no tenderness. There is no rebound and no guarding.  Musculoskeletal: Normal range of motion. She exhibits no edema.  Neurological: She is alert and oriented to person, place, and time.  Skin: Skin is warm. No rash noted. No erythema.  Psychiatric: Affect and judgment normal.      LABORATORY PANEL:   CBC  Recent Labs Lab 11/22/15 0540  WBC 15.8*  HGB 9.8*  HCT 30.1*  PLT 245   ------------------------------------------------------------------------------------------------------------------  Chemistries   Recent Labs Lab 11/17/15 0507  11/22/15 0540  NA 133*  < > 140  K 2.6*  < > 4.5  CL 94*  < > 100*  CO2 30  < > 34*  GLUCOSE 231*  < > 150*  BUN 32*  < > 17  CREATININE 2.81*  < > 2.34*  CALCIUM 8.0*  < > 8.3*  MG 1.8  --   --   < > = values in this interval not  displayed. ------------------------------------------------------------------------------------------------------------------  Cardiac Enzymes No results for input(s): TROPONINI in the last 168 hours. ------------------------------------------------------------------------------------------------------------------  RADIOLOGY:  Ct Angio Chest Pe W/cm &/or Wo Cm  11/21/2015  CLINICAL DATA:  Blood in sputum. Concern for pulmonary embolism. Dialysis patient. COPD, asthma, and coronary  disease. EXAM: CT ANGIOGRAPHY CHEST WITH CONTRAST TECHNIQUE: Multidetector CT imaging of the chest was performed using the standard protocol during bolus administration of intravenous contrast. Multiplanar CT image reconstructions and MIPs were obtained to evaluate the vascular anatomy. CONTRAST:  51mL OMNIPAQUE IOHEXOL 350 MG/ML SOLN COMPARISON:  Chest CT 10/10/2015 FINDINGS: Mediastinum/Nodes: No filling defects within the pulmonary to suggest acute pulmonary embolism. No acute findings aorta or great vessels. No pericardial effusion. Mild mediastinal lymphadenopathy. No axillary or supraclavicular adenopathy. Lungs/Pleura: Large bilateral layering pleural effusions. Passive atelectasis of the LEFT and RIGHT lower lobe. There is patchy airspace disease in the RIGHT upper lobe which reaches confluence at the apex with masslike consolidation measuring 3.5 by 2.6 cm (image 20, series 6). Some patchy airspace disease in the inferior lingula additionally. Upper abdomen: Limited view of the liver, kidneys, pancreas are unremarkable. Normal adrenal glands. Musculoskeletal: Diffuse sclerosis the bones.  Acute findings. Review of the MIP images confirms the above findings. IMPRESSION: 1. No acute pulmonary embolism. 2. Large bilateral pleural effusions and lower lobe atelectasis. 3. Bilateral patchy airspace disease suggesting MULTIFOCAL PNEUMONIA versus aspiration pneumonitis. 4. Consolidation in the RIGHT upper low most suggestive of pulmonary infection. 5. Mild mediastinal adenopathy favored reactive. Electronically Signed   By: Suzy Bouchard M.D.   On: 11/21/2015 15:17     ASSESSMENT AND PLAN:   55 year old female with end-stage renal disease on hemodialysis, diabetes with retinopathy, gastroparesis and neuropathy who initially presented with abdominal pain and subsequently has acute respiratory failure secondary to pneumonia.  1. Acute respiratory failure due to pneumonia and pulmonary edema: Patient is  status post extubation. CT scan shows no acute pulmonary emboli however does show large bilateral pleural effusions and multifocal pneumonia. She was fully treated as per Dr. Annamary Carolin consult pending 2. Septic shock: Due to C. difficile and pneumonia Resolved  3. Severe electrolyte abnormality: Continue replacement when necessary  4. Gastroparesis secondary to diabetes type 2: Continue Phenergan and Reglan  5. Acute on chronic diastolic heart failure in the setting of ESRD: Continue dialysis as per nephrology.  6. ESRD on hemodialysis: Continue dialysis Monday, Wednesday and Friday.  7. Renal mass: Suggestive of early RCC. Urology consult pending  8. Type 2 diabetes with gastroparesis: Sliding scale insulin  9. Dysphagia: As per speech consult Pt will safely tolerate po diet of least restrictive consistency w/ no overt s/s of aspiration    Management plans discussed with the patient and she is in agreement.  CODE STATUS: FULL  TOTAL TIME TAKING CARE OF THIS PATIENT: 30 minutes.   Awaiting ID and physical therapy consult.  POSSIBLE D/C 2-3 days, DEPENDING ON CLINICAL CONDITION.   Joevanni Roddey M.D on 11/22/2015 at 11:14 AM  Between 7am to 6pm - Pager - [phone] After 6pm go to [email or web address] - password [email or web address] [facility]  Office  [phone]  CC: Primary care physician; Ellamae Sia, MD  Note: This dictation was prepared with Dragon dictation along with smaller phrase technology. Any transcriptional errors that result from this process are unintentional.

## 2015-11-22 NOTE — Evaluation (Signed)
Objective Swallowing Evaluation: Type of Study: MBS-Modified Barium Swallow Study  Patient Details  Name: [name] MRN: [id number] Date of Birth: 01-07-1961  Today's Date: 11/22/2015 Time: SLP Start Time (ACUTE ONLY): 0930-SLP Stop Time (ACUTE ONLY): 1045 SLP Time Calculation (min) (ACUTE ONLY): 75 min  Past Medical History:  Past Medical History  Diagnosis Date  . Asthma   . Collagen vascular disease ([place])   . Diabetes mellitus without complication ([place])   . Hypertension   . Coronary artery disease     a. cath 2013: stenting to RCA (report not available); b. cath 2014: LM nl, pLAD 40%, mLAD nl, ost LCx 40%, mid LCx nl, pRCA 30% @ site of prior stent, mRCA 50%  . Diabetic neuropathy ([place])   . ESRD (end stage renal disease) on dialysis ([place])     M-W-F  . GERD (gastroesophageal reflux disease)   . Hx of pancreatitis 2015  . Myocardial infarction ([place])   . Pneumonia   . Mitral regurgitation     a. echo 10/2013: EF 62%, noWMA, mildly dilated LA, mild to mod MR/TR, GR1DD  . COPD (chronic obstructive pulmonary disease) ([place])   . chronic diastolic CHF [id number]  . dialysis 2006  . Depression   . Anxiety   . Arthritis   . Headache   . Anemia   . Renal insufficiency    Past Surgical History:  Past Surgical History  Procedure Laterality Date  . Cholecystectomy    . Appendectomy    . Abdominal hysterectomy    . Dialysis fistula creation Left     upper arm  . Esophagogastroduodenoscopy N/A 03/08/2015    Procedure: ESOPHAGOGASTRODUODENOSCOPY (EGD);  Surgeon: Manya Silvas, MD;  Location: [facility] ENDOSCOPY;  Service: Endoscopy;  Laterality: N/A;  . Cardiac catheterization Left 07/26/2015    Procedure: Left Heart Cath and Coronary Angiography;  Surgeon: Dionisio David, MD;  Location: [facility] CV LAB;  Service: Cardiovascular;  Laterality: Left;  Marland Kitchen Eye surgery    . Fecal transplant N/A 08/23/2015    Procedure: FECAL TRANSPLANT;  Surgeon: Manya Silvas, MD;  Location: [facility]  ENDOSCOPY;  Service: Endoscopy;  Laterality: N/A;   HPI: Pt is a 55 y.o. female with a known history of ESRD, CAD and Asacol failure who presents with abdominal pain, nausea and vomiting for 2-3 days. Patient is also complaining of shortness of breath. Patient did not go for dialysis(MWF) today because of abdominal pain. CT of the abdomen ordered in the emergency room showed no acute pathology. Patient reports that her granddaughters have been sick. Pt exhibited s/s of aspiration w/ po trials during BSE yesterday and was placed on a Dys. 1 w/ Nectar liquids diet; adequate toleration of current diet per NSG report. Pt has exhibited s/s of aspiration at f/u tx session, unable to tolerate trials of thin liquids w/out coughing. Of note, pt was orally intubated emergently post admission d/t respiratory decline then was extubated 11/18/15 - 5 days intubated. Suspect intubation could have an impact on pharyngeal-laryngeal areas.   Subjective: pt alert/awake; sitting upright in MBSS chair. Conversive w/ SLP and NSG.   Assessment / Plan / Recommendation  CHL IP CLINICAL IMPRESSIONS 11/22/2015  Therapy Diagnosis Mild oral phase dysphagia;Moderate pharyngeal phase dysphagia;Mild cervical esophageal phase dysphagia;Moderate cervical esophageal phase dysphagia  Clinical Impression Pt presented w/ Moderate pharyngeal phase dysphagia c/b delayed pharyngeal swallow initiation - timing was delayed w/ all trial consistencies. Liquids triggered a swallow repsonse as they spilled to the pyriform sinuses  from the valleculae; tsp/small amounts of Nectar liquids(and purees/softened solids) appeared to be adequately managed while tsp trials of thin liquids resulted in Aspiration sec. to the delayed swallow initiation. In addition, there was reduced pharyngeal pressure generated(reduced tongue base retraction) and hyolaryngeal excursion resulting in mild-moderate vallecular then pyriform sinus/pharyngeal wall residue as trials  continued.There was observed coating of the underneath side of the epiglottis from the bolus residue in the pharynx as well as slow movements of the epiglottis during inversion/return to rest w/ trace laryngeal penetration occuring  b/t trials; also noted decreased tight closure. Pt was able to use strategy of multiple swallows to reduce pharyngeal residue. With the noted Aspiration of thin liquids, pt exhibited a cough response; a delayed cough response was noted to the suspected laryngeal penetration. During the Esophageal phase, an appearance of thickening of the anterior portion of the Esophageal wall in the cervical Esophagus below the UES; also bolus stasis and decreased motility/clearing noted in the lower cervical Esophageal area. During the oral phase, pt exhibited min. slower bolus management/transfer in preparation for the swallow - unsure if this presentation was d/t the pharyngeal-esophageal presentation. Pt was lacking dentition during study. To consider is the impact from emergent intubation on the larygneal-pharyngeal areas in terms of edema and decreased sensation which can reatly impact swallow funciton and timing.   Impact on safety and function Moderate aspiration risk      CHL IP TREATMENT RECOMMENDATION 11/22/2015  Treatment Recommendations Therapy as outlined in treatment plan below     Prognosis 11/22/2015  Prognosis for Safe Diet Advancement Fair  Barriers to Reach Goals Cognitive deficits  Barriers/Prognosis Comment --    CHL IP DIET RECOMMENDATION 11/22/2015  SLP Diet Recommendations Dysphagia 2 (Fine chop) solids;Nectar thick liquid  Liquid Administration via Cup  Medication Administration Crushed with puree  Compensations Minimize environmental distractions;Slow rate;Small sips/bites;Multiple dry swallows after each bite/sip;Follow solids with liquid;Clear throat intermittently  Postural Changes Seated upright at 90 degrees;Remain semi-upright after after feeds/meals  (Comment)      CHL IP OTHER RECOMMENDATIONS 11/22/2015  Recommended Consults Consider ENT evaluation  Oral Care Recommendations Oral care BID;Staff/trained caregiver to provide oral care  Other Recommendations Order thickener from pharmacy;Prohibited food (jello, ice cream, thin soups);Remove water pitcher      CHL IP FOLLOW UP RECOMMENDATIONS 11/22/2015  Follow up Recommendations Skilled Nursing facility      [facility] IP FREQUENCY AND DURATION 11/22/2015  Speech Therapy Frequency (ACUTE ONLY) min 2x/week  Treatment Duration 2 weeks           CHL IP ORAL PHASE 11/22/2015  Oral Phase Impaired  Oral - Pudding Teaspoon --  Oral - Pudding Cup --  Oral - Honey Teaspoon --  Oral - Honey Cup --  Oral - Nectar Teaspoon --  Oral - Nectar Cup --  Oral - Nectar Straw --  Oral - Thin Teaspoon --  Oral - Thin Cup --  Oral - Thin Straw --  Oral - Puree --  Oral - Mech Soft --  Oral - Regular --  Oral - Multi-Consistency --  Oral - Pill --  Oral Phase - Comment slower oral management/transfer for swallowing intermittently w/ trials    CHL IP PHARYNGEAL PHASE 11/22/2015  Pharyngeal Phase Impaired  Pharyngeal- Pudding Teaspoon --  Pharyngeal --  Pharyngeal- Pudding Cup --  Pharyngeal --  Pharyngeal- Honey Teaspoon --  Pharyngeal --  Pharyngeal- Honey Cup --  Pharyngeal --  Pharyngeal- Nectar Teaspoon --  Pharyngeal --  Pharyngeal- Nectar Cup --  Pharyngeal --  Pharyngeal- Nectar Straw --  Pharyngeal --  Pharyngeal- Thin Teaspoon --  Pharyngeal --  Pharyngeal- Thin Cup --  Pharyngeal --  Pharyngeal- Thin Straw --  Pharyngeal --  Pharyngeal- Puree --  Pharyngeal --  Pharyngeal- Mechanical Soft --  Pharyngeal --  Pharyngeal- Regular --  Pharyngeal --  Pharyngeal- Multi-consistency --  Pharyngeal --  Pharyngeal- Pill --  Pharyngeal --  Pharyngeal Comment delayed pharyngeal swallow initiation; decreased pharyngeal pressure and hyolaryngeal excursion during swallowing; slow  epiglottic movements; decreased airway protection during swallowing     CHL IP CERVICAL ESOPHAGEAL PHASE 11/22/2015  Cervical Esophageal Phase Impaired  Pudding Teaspoon --  Pudding Cup --  Honey Teaspoon --  Honey Cup --  Nectar Teaspoon --  Nectar Cup --  Nectar Straw --  Thin Teaspoon --  Thin Cup --  Thin Straw --  Puree --  Mechanical Soft --  Regular --  Multi-consistency --  Pill --  Cervical Esophageal Comment appearance of thickening of the anterior portion of the Esophageal wall in the cervical Esophagus below the UES; bolus stasis and decreased motility/clearing noted in the lower cervical Esophageal area.     Radiological Procedure: A videoflouroscopic evaluation of oral-preparatory, reflex initiation, and pharyngeal phases of the swallow was performed; as well as a screening of the upper esophageal phase.        POSTURE: upright       VIEW: lateral COMPENSATORY STRATEGIES: multiple swallows; alternate food/liquid; time b/t trials; small, single boluses BOLUSES ADMINISTERED:  Thin Liquid: 2 trials (tsp)  Nectar-thick Liquid: 6 trials (tsp, straw)  Honey-thick Liquid: NT  Puree: 2 trials  Mechanical Soft: 2 trials       [name], MS, CCC-SLP  [name] 11/22/2015, 1:52 PM

## 2015-11-22 NOTE — Care Management Important Message (Signed)
Important Message  Patient Details  Name: [name] MRN: [id number] Date of Birth: July 08, 1961   Medicare Important Message Given:  Yes    Beverly Sessions, RN 11/22/2015, 11:14 AM

## 2015-11-22 NOTE — Consult Note (Signed)
[facility] Infectious Disease     Reason for Consult: PNA   Referring Physician: Bettey Costa Date of Admission:  11/12/2015   Active Problems:   Nausea   High temperature   Pulmonary edema   Encounter for central line placement   Encounter for orogastric (OG) tube placement   Acute renal failure ([place])   Respiratory failure ([place])   HPI: [name] is a 55 y.o. female admitted 2/20 with abd  Pain, nv.  Had missed HD. Developed worsening hypoxia and was intubated. Sputum grew Strep PNA and enterobacter. Treated with IV vanco and unasyn for a few days. C diff was indeterminate but PCR negative.    Currently on aspiration precautions, some cough but reports better. No diarrhea    Past Medical History  Diagnosis Date  . Asthma   . Collagen vascular disease ([place])   . Diabetes mellitus without complication ([place])   . Hypertension   . Coronary artery disease     a. cath 2013: stenting to RCA (report not available); b. cath 2014: LM nl, pLAD 40%, mLAD nl, ost LCx 40%, mid LCx nl, pRCA 30% @ site of prior stent, mRCA 50%  . Diabetic neuropathy ([place])   . ESRD (end stage renal disease) on dialysis ([place])     M-W-F  . GERD (gastroesophageal reflux disease)   . Hx of pancreatitis 2015  . Myocardial infarction ([place])   . Pneumonia   . Mitral regurgitation     a. echo 10/2013: EF 62%, noWMA, mildly dilated LA, mild to mod MR/TR, GR1DD  . COPD (chronic obstructive pulmonary disease) ([place])   . chronic diastolic CHF 1/47/8295  . dialysis 2006  . Depression   . Anxiety   . Arthritis   . Headache   . Anemia   . Renal insufficiency    Past Surgical History  Procedure Laterality Date  . Cholecystectomy    . Appendectomy    . Abdominal hysterectomy    . Dialysis fistula creation Left     upper arm  . Esophagogastroduodenoscopy N/A 03/08/2015    Procedure: ESOPHAGOGASTRODUODENOSCOPY (EGD);  Surgeon: Manya Silvas, MD;  Location: [facility] ENDOSCOPY;  Service: Endoscopy;  Laterality: N/A;   . Cardiac catheterization Left 07/26/2015    Procedure: Left Heart Cath and Coronary Angiography;  Surgeon: Dionisio , MD;  Location: [facility] CV LAB;  Service: Cardiovascular;  Laterality: Left;  Marland Kitchen Eye surgery    . Fecal transplant N/A 08/23/2015    Procedure: FECAL TRANSPLANT;  Surgeon: Manya Silvas, MD;  Location: [facility] ENDOSCOPY;  Service: Endoscopy;  Laterality: N/A;   Social History  Substance Use Topics  . Smoking status: Current Some Day Smoker -- 0.50 packs/day    Types: Cigarettes    Last Attempt to Quit: 02/13/2015  . Smokeless tobacco: Never Used  . Alcohol Use: No   Family History  Problem Relation Age of Onset  . Kidney disease Mother   . Diabetes Mother     Allergies:  Allergies  Allergen Reactions  . Compazine [Prochlorperazine Edisylate] Anaphylaxis and Nausea And Vomiting  . Ace Inhibitors Swelling  . Ativan [Lorazepam] Other (See Comments)    Reaction:  Hallucinations and headaches  . Codeine Nausea And Vomiting  . Gabapentin Other (See Comments)    Reaction:  Unknown   . Losartan Other (See Comments)    Reaction:  Unknown   . Ondansetron Other (See Comments)    Reaction:  Unknown   . Prochlorperazine Other (See Comments)  Reaction:  Unknown   . Scopolamine Other (See Comments)    Reaction:  Unknown   . Zofran [Ondansetron Hcl] Other (See Comments)    Reaction:  hallucinations   . Oxycodone Anxiety  . Tape Rash    Current antibiotics: Antibiotics Given (last 72 hours)    Date/Time Action Medication Dose Rate   11/19/15 1905 Given   vancomycin (VANCOCIN) 50 mg/mL oral solution 125 mg 125 mg    11/20/15 0119 Given   vancomycin (VANCOCIN) 50 mg/mL oral solution 125 mg 125 mg    11/20/15 0537 Given   vancomycin (VANCOCIN) 50 mg/mL oral solution 125 mg 125 mg    11/20/15 1209 Given   vancomycin (VANCOCIN) 50 mg/mL oral solution 125 mg 125 mg    11/22/15 1812 Given   levofloxacin (LEVAQUIN) IVPB 750 mg 750 mg 100 mL/hr       MEDICATIONS: . acidophilus  2 capsule Oral TID  . amiodarone  200 mg Oral Daily  . antiseptic oral rinse  7 mL Mouth Rinse 10 times per day  . atorvastatin  20 mg Oral QPM  . bacitracin   Topical BID  . chlorhexidine gluconate  15 mL Mouth Rinse BID  . cholecalciferol  400 Units Per Tube Daily  . citalopram  20 mg Oral Daily  . clopidogrel  75 mg Oral Daily  . guaiFENesin  600 mg Oral BID  . insulin aspart  0-5 Units Subcutaneous QHS  . insulin aspart  0-9 Units Subcutaneous TID WC  . ipratropium-albuterol  3 mL Nebulization TID  . [START ON 11/24/2015] levofloxacin (LEVAQUIN) IV  500 mg Intravenous Q48H  . levofloxacin (LEVAQUIN) IV  750 mg Intravenous Once  . lipase/protease/amylase  12,000 Units Oral TID WC  . pantoprazole  40 mg Oral Q1200  . potassium chloride  40 mEq Oral Once  . vitamin B-12  1,000 mcg Oral Daily    Review of Systems - 11 systems reviewed and negative per HPI  OBJECTIVE: Temp:  [97.8 F (36.6 C)-98.2 F (36.8 C)] 97.8 F (36.6 C) (03/02 0511) Pulse Rate:  [75-88] 80 (03/02 1617) Resp:  [16-24] 16 (03/02 0511) BP: (99-171)/(53-76) 99/76 mmHg (03/02 0511) SpO2:  [78 %-100 %] 78 % (03/02 1617) Physical Exam  Constitutional: slowed mentation, nad.  HENT: [place]/AT, PERRLA, no scleral icterus Mouth/Throat: Oropharynx - thick mucus Cardiovascular: RRR Pulmonary/Chest: Bil rhonchi and decreased BS Neck = supple, no nuchal rigidity Abdominal: Soft. Bowel sounds are normal.  exhibits no distension. There is no tenderness.  Lymphadenopathy: no cervical adenopathy. No axillary adenopathy Neurological: slowed mentation  Skin: Skin is warm and dry. No rash noted. No erythema.  Psychiatric: flat affect LABS: Results for orders placed or performed during the hospital encounter of 11/12/15 (from the past 48 hour(s))  Glucose, capillary     Status: Abnormal   Collection Time: 11/20/15  8:09 PM  Result Value Ref Range   Glucose-Capillary 139 (H) 65 - 99 mg/dL   Glucose, capillary     Status: Abnormal   Collection Time: 11/21/15 12:08 AM  Result Value Ref Range   Glucose-Capillary 113 (H) 65 - 99 mg/dL  Glucose, capillary     Status: Abnormal   Collection Time: 11/21/15  3:57 AM  Result Value Ref Range   Glucose-Capillary 123 (H) 65 - 99 mg/dL  CBC     Status: Abnormal   Collection Time: 11/21/15  4:45 AM  Result Value Ref Range   WBC 21.2 (H) 3.6 - 11.0 K/uL  RBC 3.76 (L) 3.80 - 5.20 MIL/uL   Hemoglobin 10.1 (L) 12.0 - 16.0 g/dL   HCT 31.8 (L) 35.0 - 47.0 %   MCV 84.5 80.0 - 100.0 fL   MCH 26.9 26.0 - 34.0 pg   MCHC 31.9 (L) 32.0 - 36.0 g/dL   RDW 20.4 (H) 11.5 - 14.5 %   Platelets 211 150 - 440 K/uL  Glucose, capillary     Status: None   Collection Time: 11/21/15  7:34 AM  Result Value Ref Range   Glucose-Capillary 93 65 - 99 mg/dL   Comment 1 Notify RN   Glucose, capillary     Status: Abnormal   Collection Time: 11/21/15 12:07 PM  Result Value Ref Range   Glucose-Capillary 104 (H) 65 - 99 mg/dL   Comment 1 Notify RN   Glucose, capillary     Status: Abnormal   Collection Time: 11/21/15  3:38 PM  Result Value Ref Range   Glucose-Capillary 107 (H) 65 - 99 mg/dL   Comment 1 Notify RN   Glucose, capillary     Status: Abnormal   Collection Time: 11/21/15  4:52 PM  Result Value Ref Range   Glucose-Capillary 103 (H) 65 - 99 mg/dL   Comment 1 Notify RN   Glucose, capillary     Status: Abnormal   Collection Time: 11/21/15  8:24 PM  Result Value Ref Range   Glucose-Capillary 164 (H) 65 - 99 mg/dL  Glucose, capillary     Status: Abnormal   Collection Time: 11/21/15  9:54 PM  Result Value Ref Range   Glucose-Capillary 179 (H) 65 - 99 mg/dL  Glucose, capillary     Status: None   Collection Time: 11/22/15 12:01 AM  Result Value Ref Range   Glucose-Capillary 95 65 - 99 mg/dL  Glucose, capillary     Status: Abnormal   Collection Time: 11/22/15  3:45 AM  Result Value Ref Range   Glucose-Capillary 51 (L) 65 - 99 mg/dL  Glucose,  capillary     Status: Abnormal   Collection Time: 11/22/15  4:38 AM  Result Value Ref Range   Glucose-Capillary 116 (H) 65 - 99 mg/dL  CBC     Status: Abnormal   Collection Time: 11/22/15  5:40 AM  Result Value Ref Range   WBC 15.8 (H) 3.6 - 11.0 K/uL   RBC 3.64 (L) 3.80 - 5.20 MIL/uL   Hemoglobin 9.8 (L) 12.0 - 16.0 g/dL   HCT 30.1 (L) 35.0 - 47.0 %   MCV 82.8 80.0 - 100.0 fL   MCH 27.0 26.0 - 34.0 pg   MCHC 32.6 32.0 - 36.0 g/dL   RDW 20.8 (H) 11.5 - 14.5 %   Platelets 245 150 - 440 K/uL  Basic metabolic panel     Status: Abnormal   Collection Time: 11/22/15  5:40 AM  Result Value Ref Range   Sodium 140 135 - 145 mmol/L   Potassium 4.5 3.5 - 5.1 mmol/L   Chloride 100 (L) 101 - 111 mmol/L   CO2 34 (H) 22 - 32 mmol/L   Glucose, Bld 150 (H) 65 - 99 mg/dL   BUN 17 6 - 20 mg/dL   Creatinine, Ser 2.34 (H) 0.44 - 1.00 mg/dL   Calcium 8.3 (L) 8.9 - 10.3 mg/dL   GFR calc non Af Amer 22 (L) >60 mL/min   GFR calc Af Amer 26 (L) >60 mL/min    Comment: (NOTE) The eGFR has been calculated using the CKD EPI equation. This  calculation has not been validated in all clinical situations. eGFR's persistently <60 mL/min signify possible Chronic Kidney Disease.    Anion gap 6 5 - 15  Influenza panel by PCR (type A & B, H1N1)     Status: None   Collection Time: 11/22/15  5:50 AM  Result Value Ref Range   Influenza A By PCR NEGATIVE NEGATIVE   Influenza B By PCR NEGATIVE NEGATIVE   H1N1 flu by pcr NOT DETECTED NOT DETECTED    Comment:        The Xpert Flu assay (FDA approved for nasal aspirates or washes and nasopharyngeal swab specimens), is intended as an aid in the diagnosis of influenza and should not be used as a sole basis for treatment.   Glucose, capillary     Status: Abnormal   Collection Time: 11/22/15  7:31 AM  Result Value Ref Range   Glucose-Capillary 136 (H) 65 - 99 mg/dL   Comment 1 Notify RN   Glucose, capillary     Status: Abnormal   Collection Time: 11/22/15 11:50  AM  Result Value Ref Range   Glucose-Capillary 160 (H) 65 - 99 mg/dL   Comment 1 Notify RN   Glucose, capillary     Status: Abnormal   Collection Time: 11/22/15  5:23 PM  Result Value Ref Range   Glucose-Capillary 132 (H) 65 - 99 mg/dL   Comment 1 Notify RN    No components found for: ESR, C REACTIVE PROTEIN MICRO: Recent Results (from the past 720 hour(s))  MRSA PCR Screening     Status: None   Collection Time: 11/13/15  9:46 PM  Result Value Ref Range Status   MRSA by PCR NEGATIVE NEGATIVE Final    Comment:        The GeneXpert MRSA Assay (FDA approved for NASAL specimens only), is one component of a comprehensive MRSA colonization surveillance program. It is not intended to diagnose MRSA infection nor to guide or monitor treatment for MRSA infections.   Culture, blood (Routine X 2) w Reflex to ID Panel     Status: None   Collection Time: 11/14/15 12:53 AM  Result Value Ref Range Status   Specimen Description BLOOD RIGHT HAND  Final   Special Requests BOTTLES DRAWN AEROBIC AND ANAEROBIC 5ML  Final   Culture NO GROWTH 6 DAYS  Final   Report Status 11/20/2015 FINAL  Final  Culture, blood (Routine X 2) w Reflex to ID Panel     Status: None   Collection Time: 11/14/15  7:14 AM  Result Value Ref Range Status   Specimen Description BLOOD RIGHT HAND  Final   Special Requests   Final    BOTTLES DRAWN AEROBIC AND ANAEROBIC AER 4ML ANA 2ML   Culture NO GROWTH 6 DAYS  Final   Report Status 11/20/2015 FINAL  Final  Urine culture     Status: None   Collection Time: 11/14/15 10:16 AM  Result Value Ref Range Status   Specimen Description URINE, RANDOM  Final   Special Requests NONE  Final   Culture NO GROWTH 1 DAY  Final   Report Status 11/15/2015 FINAL  Final  Culture, expectorated sputum-assessment     Status: None   Collection Time: 11/14/15  4:20 PM  Result Value Ref Range Status   Specimen Description SPU  Final   Special Requests NONE  Final   Sputum evaluation THIS  SPECIMEN IS ACCEPTABLE FOR SPUTUM CULTURE  Final   Report Status 11/14/2015 FINAL  Final  Culture, respiratory (NON-Expectorated)     Status: None   Collection Time: 11/14/15  4:20 PM  Result Value Ref Range Status   Specimen Description SPU  Final   Special Requests NONE Reflexed from [id number]  Final   Gram Stain   Final    MANY WBC SEEN NO SQUAMOUS EPITHELIAL CELLS PRESENT FEW GRAM POSITIVE COCCI EXCELLENT SPECIMEN - 90-100% WBCS    Culture   Final    LIGHT GROWTH ENTEROBACTER CLOACAE MODERATE GROWTH STREPTOCOCCUS PNEUMONIAE    Report Status 11/18/2015 FINAL  Final   Organism ID, Bacteria ENTEROBACTER CLOACAE  Final   Organism ID, Bacteria STREPTOCOCCUS PNEUMONIAE  Final      Susceptibility   Streptococcus pneumoniae - MIC*    ERYTHROMYCIN <=0.12 SENSITIVE Sensitive     LEVOFLOXACIN 1 SENSITIVE Sensitive     VANCOMYCIN 0.5 SENSITIVE Sensitive     PENICILLIN <=0.06 SENSITIVE Sensitive     CEFTRIAXONE <=0.12 SENSITIVE Sensitive     * MODERATE GROWTH STREPTOCOCCUS PNEUMONIAE   Enterobacter cloacae - MIC*    PIP/TAZO Value in next row Sensitive      SENSITIVE8    CEFAZOLIN Value in next row Resistant      RESISTANT>=64    CEFEPIME Value in next row Sensitive      SENSITIVE<=1    CEFTAZIDIME Value in next row Sensitive      SENSITIVE<=1    CEFTRIAXONE Value in next row Sensitive      SENSITIVE<=1    CIPROFLOXACIN Value in next row Sensitive      SENSITIVE<=0.25    GENTAMICIN Value in next row Sensitive      SENSITIVE<=1    IMIPENEM Value in next row Sensitive      SENSITIVE1    TRIMETH/SULFA Value in next row Sensitive      SENSITIVE<=20    * LIGHT GROWTH ENTEROBACTER CLOACAE  C difficile quick scan w PCR reflex     Status: Abnormal   Collection Time: 11/15/15  7:36 AM  Result Value Ref Range Status   C Diff antigen POSITIVE (A) NEGATIVE Final   C Diff toxin NEGATIVE NEGATIVE Final   C Diff interpretation   Final    Negative for toxigenic C. difficile. Toxin gene and  active toxin production not detected. May be a nontoxigenic strain of C. difficile bacteria present, lacking the ability to produce toxin.  Clostridium Difficile by PCR     Status: None   Collection Time: 11/15/15  7:36 AM  Result Value Ref Range Status   Toxigenic C Difficile by pcr NEGATIVE NEGATIVE Final    IMAGING: Dg Chest 1 View  11/17/2015  CLINICAL DATA:  55 year old female with shortness of breath and respiratory failure. EXAM: CHEST 1 VIEW COMPARISON:  11/15/2015 and prior exams FINDINGS: An endotracheal tube, left subclavian central venous catheter with tip overlying the superior cavoatrial junction an NG tube entering stomach again identified. A left subclavian/axillary stent is again noted. New peripheral right upper lobe opacities are identified. Decreased diffuse bilateral airspace opacities noted. Bilateral pleural effusions and bibasilar opacities, left-greater-than-right, again noted. There is no evidence of pneumothorax. IMPRESSION: New peripheral right upper lobe opacities with decrease diffuse bilateral airspace opacities/ edema otherwise. Electronically Signed   By: Margarette Canada M.D.   On: 11/17/2015 07:09   Dg Chest 1 View  11/14/2015  CLINICAL DATA:  Pulmonary edema.  History of CHF and asthma. EXAM: CHEST 1 VIEW COMPARISON:  Portable chest x-ray of November 13, 2015 FINDINGS: The  lungs remain hypoinflated. Moderate-sized bilateral pleural effusions remain. The retrocardiac region on the left remains dense. The cardiac silhouette remains enlarged and the pulmonary vascularity remains engorged. The mediastinum is normal in width. The observed bony thorax is unremarkable. A vascular graft is present in the right antecubital and left axillary regions. IMPRESSION: Congestive heart failure with pulmonary interstitial edema and bilateral pleural effusions. There has not been significant interval change since yesterday's study. Density at both lung bases may reflect atelectasis or  pneumonia. Electronically Signed   By: Audra Kagel  [place] M.D.   On: 11/14/2015 08:55   Dg Chest 1 View  11/14/2015  CLINICAL DATA:  End-stage renal disease and anemia EXAM: CHEST 1 VIEW COMPARISON:  November 12, 2015 FINDINGS: There is cardiomegaly with pulmonary venous hypertension. There are bilateral pleural effusions. There is mild bibasilar interstitial edema. There is atelectatic change in the lung bases as well. There is a left axillary region stent. IMPRESSION: Findings consistent with congestive heart failure. Bibasilar atelectatic change; superimposed early pneumonia in the bases cannot be excluded radiographically. Electronically Signed   By: Lowella Grip III M.D.   On: 11/14/2015 08:52   Dg Chest 2 View  10/25/2015  CLINICAL DATA:  Vomiting, shortness of breath x1 day, right upper quadrant pain EXAM: CHEST  2 VIEW COMPARISON:  10/22/2015 FINDINGS: Increased interstitial markings. Small bilateral pleural effusions. Associated mild bilateral lower lobe opacities, likely atelectasis. No pneumothorax. Cardiomegaly. Left axillary and subclavian stent. IMPRESSION: Cardiomegaly with mild interstitial edema and small bilateral pleural effusions, unchanged. Associated mild bilateral lower lobe opacities, likely atelectasis. Electronically Signed   By: Julian Hy M.D.   On: 10/25/2015 08:40   Dg Abd 1 View  11/17/2015  CLINICAL DATA:  Feeding tube placement EXAM: ABDOMEN - 1 VIEW COMPARISON:  11/14/2015 FINDINGS: NG tube is in place with the tip in the mid stomach and the side-port in the proximal to mid stomach. Prior cholecystectomy. IMPRESSION: NG tube tip in the mid stomach. Electronically Signed   By: Rolm Baptise M.D.   On: 11/17/2015 10:57   Dg Abd 1 View  11/14/2015  CLINICAL DATA:  OG tube placement. EXAM: ABDOMEN - 1 VIEW COMPARISON:  None. FINDINGS: OG tube is in place in good position with the side-port in the stomach. IMPRESSION: As above. Electronically Signed   By: Inge Rise M.D.   On: 11/14/2015 13:23   Ct Angio Chest Pe W/cm &/or Wo Cm  11/21/2015  CLINICAL DATA:  Blood in sputum. Concern for pulmonary embolism. Dialysis patient. COPD, asthma, and coronary disease. EXAM: CT ANGIOGRAPHY CHEST WITH CONTRAST TECHNIQUE: Multidetector CT imaging of the chest was performed using the standard protocol during bolus administration of intravenous contrast. Multiplanar CT image reconstructions and MIPs were obtained to evaluate the vascular anatomy. CONTRAST:  66m OMNIPAQUE IOHEXOL 350 MG/ML SOLN COMPARISON:  Chest CT 10/10/2015 FINDINGS: Mediastinum/Nodes: No filling defects within the pulmonary to suggest acute pulmonary embolism. No acute findings aorta or great vessels. No pericardial effusion. Mild mediastinal lymphadenopathy. No axillary or supraclavicular adenopathy. Lungs/Pleura: Large bilateral layering pleural effusions. Passive atelectasis of the LEFT and RIGHT lower lobe. There is patchy airspace disease in the RIGHT upper lobe which reaches confluence at the apex with masslike consolidation measuring 3.5 by 2.6 cm (image 20, series 6). Some patchy airspace disease in the inferior lingula additionally. Upper abdomen: Limited view of the liver, kidneys, pancreas are unremarkable. Normal adrenal glands. Musculoskeletal: Diffuse sclerosis the bones.  Acute findings. Review of the MIP images  confirms the above findings. IMPRESSION: 1. No acute pulmonary embolism. 2. Large bilateral pleural effusions and lower lobe atelectasis. 3. Bilateral patchy airspace disease suggesting MULTIFOCAL PNEUMONIA versus aspiration pneumonitis. 4. Consolidation in the RIGHT upper low most suggestive of pulmonary infection. 5. Mild mediastinal adenopathy favored reactive. Electronically Signed   By: Suzy Bouchard M.D.   On: 11/21/2015 15:17   Ct Abdomen Pelvis W Contrast  11/12/2015  CLINICAL DATA:  Nausea and vomiting for 3 days. Upper abdominal pain for 1 week. Chronic renal failure on  dialysis. EXAM: CT ABDOMEN AND PELVIS WITH CONTRAST TECHNIQUE: Multidetector CT imaging of the abdomen and pelvis was performed using the standard protocol following bolus administration of intravenous contrast. CONTRAST:  147m OMNIPAQUE IOHEXOL 300 MG/ML  SOLN COMPARISON:  07/22/2015 FINDINGS: Lower chest: Stable mild cardiomegaly. Moderate bilateral pleural effusions and bibasilar atelectasis show no significant change. Hepatobiliary: No masses identified. Periportal edema noted. Prior cholecystectomy noted. No evidence of biliary dilatation. Pancreas: No mass, inflammatory changes, or other significant abnormality. Spleen: Within normal limits in size and appearance. Adrenals/Urinary Tract: No adrenal masses identified. Bilateral diffuse renal parenchymal atrophy, consistent with end-stage renal disease. No evidence of hydronephrosis. Two enhancing lesions are seen in the upper pole the right kidney measuring 1.3 cm anteriorly on image 37/series 2 and 11 mm posteriorly on image 36/series 2. These are suspicious for small renal cell carcinomas. No evidence hydronephrosis. Unopacified urinary bladder is unremarkable in appearance. Stomach/Bowel: No evidence of obstruction, inflammatory process, or abnormal fluid collections. Sigmoid diverticulosis. No radiographic evidence of diverticulitis. Vascular/Lymphatic: No pathologically enlarged lymph nodes. No evidence of abdominal aortic aneurysm. Reproductive: Prior hysterectomy noted. Adnexal regions are unremarkable in appearance. Other: Diffuse body wall edema again demonstrated. Musculoskeletal:  No suspicious bone lesions identified. IMPRESSION: Colonic diverticulosis. No radiographic evidence of diverticulitis or bowel obstruction. Stable moderate bilateral pleural effusions and diffuse body wall edema/anasarca. Diffuse bilateral renal atrophy, consistent with end-stage renal disease. Two small enhancing masses in upper pole of right kidney, suspicious for small  renal cell carcinomas. Recommend urology referral and consider abdomen MRI without contrast for further evaluation. Electronically Signed   By: JEarle GellM.D.   On: 11/12/2015 11:44   [place]oreaVenous Img Upper Uni Right  11/16/2015  CLINICAL DATA:  Right upper extremity edema.  Evaluate for DVT. EXAM: RIGHT UPPER EXTREMITY VENOUS DOPPLER ULTRASOUND TECHNIQUE: Gray-scale sonography with graded compression, as well as color Doppler and duplex ultrasound were performed to evaluate the upper extremity deep venous system from the level of the subclavian vein and including the jugular, axillary, basilic, radial, ulnar and upper cephalic vein. Spectral Doppler was utilized to evaluate flow at rest and with distal augmentation maneuvers. COMPARISON:  None. FINDINGS: Contralateral Subclavian Vein: Respiratory phasicity is normal and symmetric with the symptomatic side. No evidence of thrombus. Normal compressibility. Internal Jugular Vein: No evidence of thrombus. Normal compressibility, respiratory phasicity and response to augmentation. Subclavian Vein: No evidence of thrombus. Normal compressibility, respiratory phasicity and response to augmentation. Axillary Vein: No evidence of thrombus. Normal compressibility, respiratory phasicity and response to augmentation. Cephalic Vein: Not well visualized secondary to presence of prior dialysis graft. Basilic Vein: Not visualized. Brachial Veins: No evidence of thrombus. Normal compressibility, respiratory phasicity and response to augmentation. Radial Veins: No evidence of thrombus. Normal compressibility, respiratory phasicity and response to augmentation. Ulnar Veins: No evidence of thrombus. Normal compressibility, respiratory phasicity and response to augmentation. Venous Reflux:  None visualized. Other Findings:  None visualized. IMPRESSION: No evidence of DVT  within the right upper extremity. Electronically Signed   By: Sandi Mariscal M.D.   On: 11/16/2015 16:06   Dg Chest  Port 1 View  11/18/2015  CLINICAL DATA:  Respiratory failure. EXAM: PORTABLE CHEST 1 VIEW COMPARISON:  11/17/2015 FINDINGS: Small bilateral pleural effusions are again noted, stable. Interstitial prominence throughout the lungs. Bilateral perihilar and lower lobe opacities. Findings likely reflect edema/CHF. No real change since prior study. Heart is borderline in size. Support devices are unchanged. IMPRESSION: Probable pulmonary edema/ CHF. Small layering effusions. No real change since prior study. Electronically Signed   By: Rolm Baptise M.D.   On: 11/18/2015 07:36   Dg Chest Port 1 View  11/15/2015  CLINICAL DATA:  Acute respiratory failure EXAM: PORTABLE CHEST 1 VIEW COMPARISON:  11/14/2015 FINDINGS: Endotracheal tube in good position. Central venous catheter tip near the cavoatrial junction unchanged. No pneumothorax. NG tube in the stomach Progression of diffuse bilateral airspace disease. Bilateral pleural effusions and bibasilar atelectasis. IMPRESSION: Endotracheal tube remains in good position Worsening bilateral airspace disease most likely heart failure with edema. Electronically Signed   By: Franchot Gallo M.D.   On: 11/15/2015 06:40   Dg Chest Port 1 View  11/14/2015  CLINICAL DATA:  Status post central line placement EXAM: PORTABLE CHEST 1 VIEW COMPARISON:  11/14/2015 FINDINGS: Cardiac shadow is mildly enlarged. Bilateral pleural effusions and likely bibasilar infiltrate is again identified. Changes consistent with prior stenting in both upper extremities is seen. Additionally the left-sided stenting extends centrally into the subclavian vessel. A new left subclavian central venous line is noted with catheter tip in the superior right atrium. No pneumothorax is noted. Endotracheal tube and nasogastric catheter are noted in satisfactory position. The endotracheal tube is approximately 5 cm above the carina. No bony abnormality is seen. IMPRESSION: Status post left central venous line without  pneumothorax. Interval placement of endotracheal tube and nasogastric catheter. Bibasilar changes stable from the prior exam. Electronically Signed   By: Inez Catalina M.D.   On: 11/14/2015 13:27   Dg Chest Port 1 View  11/12/2015  CLINICAL DATA:  Nausea and vomiting since dialysis 3 days ago. Unable to finish dialysis at that time. Wheezing. Asthma. EXAM: PORTABLE CHEST 1 VIEW COMPARISON:  10/25/2015. FINDINGS: Stable left axillary and right arm stents. Mildly progressive enlargement of the cardiac silhouette with mildly increased prominence of the pulmonary vasculature and interstitial markings and increased size of bilateral pleural effusions and bibasilar atelectasis. Diffuse osteopenia. IMPRESSION: Mild worsening of changes of congestive heart failure. Electronically Signed   By: Claudie Revering M.D.   On: 11/12/2015 12:54    Assessment:   [name] is a 56 y.o. female with hx of ESRD on dialysis, type 2 DM, hypertension, COPD, CHF and CAD admitted with n,v and AMS. She has multifocal PNA and large pleural effusion.   Prior sputum cx with Strep PNA and enterobacter cloacae, but only received 4 days treatment for the gram neg pna. This likely explains her leukocytosis.  Her CT suggests chronic bronchiectasis and she is an aspiration risk.     Recommendations Start levofloxacin to cover the strep PNA and enterobacter - would give another 7 day course. Consider thoracentesis if worsens as she has large pleural effusion (likely due to HD and vol overload but would need to ro empyema if worsens. Thank you very much for allowing me to participate in the care of this patient. Please call with questions.   Cheral Marker. Ola Spurr, MD

## 2015-11-23 LAB — GLUCOSE, CAPILLARY
Glucose-Capillary: 73 mg/dL (ref 65–99)
Glucose-Capillary: 102 mg/dL — ABNORMAL HIGH (ref 65–99)
Glucose-Capillary: 94 mg/dL (ref 65–99)
Glucose-Capillary: 87 mg/dL (ref 65–99)
Glucose-Capillary: 65 mg/dL (ref 65–99)
Glucose-Capillary: 65 mg/dL (ref 65–99)
Glucose-Capillary: 105 mg/dL — ABNORMAL HIGH (ref 65–99)
Glucose-Capillary: 119 mg/dL — ABNORMAL HIGH (ref 65–99)
Glucose-Capillary: 134 mg/dL — ABNORMAL HIGH (ref 65–99)

## 2015-11-23 LAB — CBC
HCT: 32.9 % — ABNORMAL LOW (ref 35.0–47.0)
Hemoglobin: 10.3 g/dL — ABNORMAL LOW (ref 12.0–16.0)
MCH: 26.4 pg (ref 26.0–34.0)
MCHC: 31.2 g/dL — ABNORMAL LOW (ref 32.0–36.0)
MCV: 84.6 fL (ref 80.0–100.0)
Platelets: 270 10*3/uL (ref 150–440)
RBC: 3.89 MIL/uL (ref 3.80–5.20)
RDW: 20.8 % — ABNORMAL HIGH (ref 11.5–14.5)
WBC: 14.5 10*3/uL — ABNORMAL HIGH (ref 3.6–11.0)

## 2015-11-23 LAB — BASIC METABOLIC PANEL
Anion gap: 13 (ref 5–15)
BUN: 25 mg/dL — ABNORMAL HIGH (ref 6–20)
CO2: 29 mmol/L (ref 22–32)
Calcium: 8 mg/dL — ABNORMAL LOW (ref 8.9–10.3)
Chloride: 97 mmol/L — ABNORMAL LOW (ref 101–111)
Creatinine, Ser: 3.11 mg/dL — ABNORMAL HIGH (ref 0.44–1.00)
GFR calc Af Amer: 18 mL/min — ABNORMAL LOW (ref >60)
GFR calc non Af Amer: 16 mL/min — ABNORMAL LOW (ref >60)
Glucose, Bld: 84 mg/dL (ref 65–99)
Potassium: 5.7 mmol/L — ABNORMAL HIGH (ref 3.5–5.1)
Sodium: 139 mmol/L (ref 135–145)

## 2015-11-23 NOTE — Progress Notes (Signed)
Hemodialysis completed. 

## 2015-11-23 NOTE — Progress Notes (Signed)
[name] MRN: [id number] DOB: 1961/08/16   Cancelled Treatment:    Reason Eval/Treat Not Completed: Patient at procedure or test/unavailable.  Pt currently off floor at dialysis.  Will re-attempt at a later date and time.  Mittie Bodo, SPT Mittie Bodo 11/23/2015, 1:54 PM

## 2015-11-23 NOTE — Progress Notes (Signed)
Pre-hd tx 

## 2015-11-23 NOTE — Progress Notes (Signed)
[facility] [place] NAME: [name]    MR#:  [id number]  DATE OF BIRTH:  1961/07/02  SUBJECTIVE:   Patient was sleeping soundly this morning. I tried to arouse her but she wanted to go back to bed  Nurse reports no acute issues except for blood sugar was low this morning.  REVIEW OF SYSTEMS:    Review of Systems  Unable to perform ROS   Tolerating Diet: Yes      DRUG ALLERGIES:   Allergies  Allergen Reactions  . Compazine [Prochlorperazine Edisylate] Anaphylaxis and Nausea And Vomiting  . Ace Inhibitors Swelling  . Ativan [Lorazepam] Other (See Comments)    Reaction:  Hallucinations and headaches  . Codeine Nausea And Vomiting  . Gabapentin Other (See Comments)    Reaction:  Unknown   . Losartan Other (See Comments)    Reaction:  Unknown   . Ondansetron Other (See Comments)    Reaction:  Unknown   . Prochlorperazine Other (See Comments)    Reaction:  Unknown   . Scopolamine Other (See Comments)    Reaction:  Unknown   . Zofran [Ondansetron Hcl] Other (See Comments)    Reaction:  hallucinations   . Oxycodone Anxiety  . Tape Rash    VITALS:  Blood pressure 151/66, pulse 72, temperature 97.7 F (36.5 C), temperature source Oral, resp. rate 24, height 5\' 5"  (1.651 m), weight 68.9 kg (151 lb 14.4 oz), SpO2 100 %.  PHYSICAL EXAMINATION:   Physical Exam  Constitutional: She is well-developed, well-nourished, and in no distress. No distress.  HENT:  Head: Normocephalic.  Nose: Nose normal.  Eyes: No scleral icterus.  Neck: No JVD present. No tracheal deviation present.  Cardiovascular: Normal rate, regular rhythm and normal heart sounds.  Exam reveals no gallop and no friction rub.   No murmur heard. Pulmonary/Chest: Effort normal and breath sounds normal. No respiratory distress. She has no wheezes. She has no rales. She exhibits no tenderness.  Abdominal: Soft. Bowel sounds are normal. She exhibits no  distension and no mass. There is no tenderness. There is no rebound and no guarding.  Musculoskeletal: Normal range of motion. She exhibits no edema.  Neurological:  sleeping  Skin: Skin is warm. No rash noted. No erythema.      LABORATORY PANEL:   CBC  Recent Labs Lab 11/23/15 0501  WBC 14.5*  HGB 10.3*  HCT 32.9*  PLT 270   ------------------------------------------------------------------------------------------------------------------  Chemistries   Recent Labs Lab 11/17/15 0507  11/23/15 0501  NA 133*  < > 139  K 2.6*  < > 5.7*  CL 94*  < > 97*  CO2 30  < > 29  GLUCOSE 231*  < > 84  BUN 32*  < > 25*  CREATININE 2.81*  < > 3.11*  CALCIUM 8.0*  < > 8.0*  MG 1.8  --   --   < > = values in this interval not displayed. ------------------------------------------------------------------------------------------------------------------  Cardiac Enzymes No results for input(s): TROPONINI in the last 168 hours. ------------------------------------------------------------------------------------------------------------------  RADIOLOGY:  Ct Angio Chest Pe W/cm &/or Wo Cm  11/21/2015  CLINICAL DATA:  Blood in sputum. Concern for pulmonary embolism. Dialysis patient. COPD, asthma, and coronary disease. EXAM: CT ANGIOGRAPHY CHEST WITH CONTRAST TECHNIQUE: Multidetector CT imaging of the chest was performed using the standard protocol during bolus administration of intravenous contrast. Multiplanar CT image reconstructions and MIPs were obtained to evaluate the vascular anatomy. CONTRAST:  69mL  OMNIPAQUE IOHEXOL 350 MG/ML SOLN COMPARISON:  Chest CT 10/10/2015 FINDINGS: Mediastinum/Nodes: No filling defects within the pulmonary to suggest acute pulmonary embolism. No acute findings aorta or great vessels. No pericardial effusion. Mild mediastinal lymphadenopathy. No axillary or supraclavicular adenopathy. Lungs/Pleura: Large bilateral layering pleural effusions. Passive atelectasis  of the LEFT and RIGHT lower lobe. There is patchy airspace disease in the RIGHT upper lobe which reaches confluence at the apex with masslike consolidation measuring 3.5 by 2.6 cm (image 20, series 6). Some patchy airspace disease in the inferior lingula additionally. Upper abdomen: Limited view of the liver, kidneys, pancreas are unremarkable. Normal adrenal glands. Musculoskeletal: Diffuse sclerosis the bones.  Acute findings. Review of the MIP images confirms the above findings. IMPRESSION: 1. No acute pulmonary embolism. 2. Large bilateral pleural effusions and lower lobe atelectasis. 3. Bilateral patchy airspace disease suggesting MULTIFOCAL PNEUMONIA versus aspiration pneumonitis. 4. Consolidation in the RIGHT upper low most suggestive of pulmonary infection. 5. Mild mediastinal adenopathy favored reactive. Electronically Signed   By: Suzy Bouchard M.D.   On: 11/21/2015 15:17     ASSESSMENT AND PLAN:   55 year old female with end-stage renal disease on hemodialysis, diabetes with retinopathy, gastroparesis and neuropathy who initially presented with abdominal pain and subsequently has acute respiratory failure secondary to pneumonia.  1. Acute respiratory failure due to pneumonia and pulmonary edema: Patient is status post extubation. CT scan shows no acute pulmonary emboli however does show large bilateral pleural effusions and multifocal pneumonia. She was fully treated as per Dr. Mortimer Fries ID consult pending   2. Septic shock: Due to C. difficile and pneumonia Resolved  3. Severe electrolyte abnormality: Continue replacement when necessary  4. Gastroparesis secondary to diabetes type 2: Continue Phenergan and Reglan  5. Acute on chronic diastolic heart failure in the setting of ESRD: Continue dialysis as per nephrology.  6. ESRD on hemodialysis: Continue dialysis Monday, Wednesday and Friday.  7. Renal mass: Suggestive of early RCC. Urology consult requested but likely patient will  need outpatient follow-up with urology   8. Type 2 diabetes with gastroparesis: Sliding scale insulin, continue to monitor blood sugar closely.   9. Dysphagia:  as per speech she has signs and symptoms of chronic aspiration  CONTINUE  Dysphagia 2 (fine chop);Nectar-thick liquid with aspiration precautions  Management plans discussed with the patient and she is in agreement.  CODE STATUS: FULL  TOTAL TIME TAKING CARE OF THIS PATIENT: 30 minutes.   Awaiting ID consult   POSSIBLE D/C 1-2 days to SNF, DEPENDING ON CLINICAL CONDITION.   Ellwood Steidle M.D on 11/23/2015 at 11:32 AM  Between 7am to 6pm - Pager - [phone] After 6pm go to [email or web address] - password [email or web address] [facility]  Office  [phone]  CC: Primary care physician; Ellamae Sia, MD  Note: This dictation was prepared with Dragon dictation along with smaller phrase technology. Any transcriptional errors that result from this process are unintentional.

## 2015-11-23 NOTE — Progress Notes (Signed)
Subjective:  Patient seen during dialysis Tolerating well    HEMODIALYSIS FLOWSHEET:  Blood Flow Rate (mL/min): 400 mL/min Arterial Pressure (mmHg): -170 mmHg Venous Pressure (mmHg): 180 mmHg Transmembrane Pressure (mmHg): 40 mmHg Ultrafiltration Rate (mL/min): 140 mL/min Dialysate Flow Rate (mL/min): 800 ml/min Conductivity: Machine : 14 Conductivity: Machine : 14 Dialysis Fluid Bolus: Normal Saline Bolus Amount (mL): 250 mL Intra-Hemodialysis Comments: 269ml...no distress noted  Resting quietly  Objective:  Vital signs in last 24 hours:  Temp:  [97.4 F (36.3 C)-97.7 F (36.5 C)] 97.7 F (36.5 C) (03/03 0945) Pulse Rate:  [65-83] 70 (03/03 1300) Resp:  [14-24] 14 (03/03 1300) BP: (141-177)/(61-70) 150/65 mmHg (03/03 1300) SpO2:  [78 %-100 %] 100 % (03/03 1300) Weight:  [68.9 kg (151 lb 14.4 oz)] 68.9 kg (151 lb 14.4 oz) (03/03 0945)  Weight change: -6.7 kg (-14 lb 12.3 oz) Filed Weights   11/21/15 1305 11/23/15 0428 11/23/15 0945  Weight: 73.7 kg (162 lb 7.7 oz) 68.9 kg (151 lb 14.4 oz) 68.9 kg (151 lb 14.4 oz)    Intake/Output:    Intake/Output Summary (Last 24 hours) at 11/23/15 1328 Last data filed at 11/23/15 1245  Gross per 24 hour  Intake    120 ml  Output      0 ml  Net    120 ml     Physical Exam: General: No acute distress  HEENT Anicteric, dry oral mucus membranes  Neck supple  Pulm/lungs Scattered rhonchi, normal effort  CV S1S2 no rubs  Abdomen:  Soft, Non tender, BS present  Extremities: Trace b/l LE edema  Neurologic: Awake, alert, follows commands  Skin: No acute rashes  Access: LUE AVF       Basic Metabolic Panel:   Recent Labs Lab 11/17/15 0507 11/17/15 1754 11/18/15 0519 11/19/15 0529 11/20/15 0547 11/22/15 0540 11/23/15 0501  NA 133*  --  133* 138 140 140 139  K 2.6* 3.4* 3.0* 3.3* 3.3* 4.5 5.7*  CL 94*  --  93* 97* 98* 100* 97*  CO2 30  --  30 33* 33* 34* 29  GLUCOSE 231*  --  117* 134* 179* 150* 84  BUN 32*  --   51* 29* 18 17 25*  CREATININE 2.81*  --  3.45* 2.29* 1.79* 2.34* 3.11*  CALCIUM 8.0*  --  8.5* 8.5* 8.5* 8.3* 8.0*  MG 1.8  --   --   --   --   --   --   PHOS <1.0* 2.0*  --   --  1.6*  --   --      CBC:  Recent Labs Lab 11/18/15 0519 11/19/15 0529 11/21/15 0445 11/22/15 0540 11/23/15 0501  WBC 21.3* 25.5* 21.2* 15.8* 14.5*  HGB 9.6* 9.4* 10.1* 9.8* 10.3*  HCT 29.5* 29.3* 31.8* 30.1* 32.9*  MCV 82.3 83.2 84.5 82.8 84.6  PLT 82* 90* 211 245 270      Microbiology:  Recent Results (from the past 720 hour(s))  MRSA PCR Screening     Status: None   Collection Time: 11/13/15  9:46 PM  Result Value Ref Range Status   MRSA by PCR NEGATIVE NEGATIVE Final    Comment:        The GeneXpert MRSA Assay (FDA approved for NASAL specimens only), is one component of a comprehensive MRSA colonization surveillance program. It is not intended to diagnose MRSA infection nor to guide or monitor treatment for MRSA infections.   Culture, blood (Routine X 2) w Reflex to  ID Panel     Status: None   Collection Time: 11/14/15 12:53 AM  Result Value Ref Range Status   Specimen Description BLOOD RIGHT HAND  Final   Special Requests BOTTLES DRAWN AEROBIC AND ANAEROBIC 5ML  Final   Culture NO GROWTH 6 DAYS  Final   Report Status 11/20/2015 FINAL  Final  Culture, blood (Routine X 2) w Reflex to ID Panel     Status: None   Collection Time: 11/14/15  7:14 AM  Result Value Ref Range Status   Specimen Description BLOOD RIGHT HAND  Final   Special Requests   Final    BOTTLES DRAWN AEROBIC AND ANAEROBIC AER 4ML ANA 2ML   Culture NO GROWTH 6 DAYS  Final   Report Status 11/20/2015 FINAL  Final  Urine culture     Status: None   Collection Time: 11/14/15 10:16 AM  Result Value Ref Range Status   Specimen Description URINE, RANDOM  Final   Special Requests NONE  Final   Culture NO GROWTH 1 DAY  Final   Report Status 11/15/2015 FINAL  Final  Culture, expectorated sputum-assessment     Status:  None   Collection Time: 11/14/15  4:20 PM  Result Value Ref Range Status   Specimen Description SPU  Final   Special Requests NONE  Final   Sputum evaluation THIS SPECIMEN IS ACCEPTABLE FOR SPUTUM CULTURE  Final   Report Status 11/14/2015 FINAL  Final  Culture, respiratory (NON-Expectorated)     Status: None   Collection Time: 11/14/15  4:20 PM  Result Value Ref Range Status   Specimen Description SPU  Final   Special Requests NONE Reflexed from [place]:[id number]  Final   Gram Stain   Final    MANY WBC SEEN NO SQUAMOUS EPITHELIAL CELLS PRESENT FEW GRAM POSITIVE COCCI EXCELLENT SPECIMEN - 90-100% WBCS    Culture   Final    LIGHT GROWTH ENTEROBACTER CLOACAE MODERATE GROWTH STREPTOCOCCUS PNEUMONIAE    Report Status 11/18/2015 FINAL  Final   Organism ID, Bacteria ENTEROBACTER CLOACAE  Final   Organism ID, Bacteria STREPTOCOCCUS PNEUMONIAE  Final      Susceptibility   Streptococcus pneumoniae - MIC*    ERYTHROMYCIN <=0.12 SENSITIVE Sensitive     LEVOFLOXACIN 1 SENSITIVE Sensitive     VANCOMYCIN 0.5 SENSITIVE Sensitive     PENICILLIN <=0.06 SENSITIVE Sensitive     CEFTRIAXONE <=0.12 SENSITIVE Sensitive     * MODERATE GROWTH STREPTOCOCCUS PNEUMONIAE   Enterobacter cloacae - MIC*    PIP/TAZO Value in next row Sensitive      SENSITIVE8    CEFAZOLIN Value in next row Resistant      RESISTANT>=64    CEFEPIME Value in next row Sensitive      SENSITIVE<=1    CEFTAZIDIME Value in next row Sensitive      SENSITIVE<=1    CEFTRIAXONE Value in next row Sensitive      SENSITIVE<=1    CIPROFLOXACIN Value in next row Sensitive      SENSITIVE<=0.25    GENTAMICIN Value in next row Sensitive      SENSITIVE<=1    IMIPENEM Value in next row Sensitive      SENSITIVE1    TRIMETH/SULFA Value in next row Sensitive      SENSITIVE<=20    * LIGHT GROWTH ENTEROBACTER CLOACAE  C difficile quick scan w PCR reflex     Status: Abnormal   Collection Time: 11/15/15  7:36 AM  Result Value Ref Range Status  C  Diff antigen POSITIVE (A) NEGATIVE Final   C Diff toxin NEGATIVE NEGATIVE Final   C Diff interpretation   Final    Negative for toxigenic C. difficile. Toxin gene and active toxin production not detected. May be a nontoxigenic strain of C. difficile bacteria present, lacking the ability to produce toxin.  Clostridium Difficile by PCR     Status: None   Collection Time: 11/15/15  7:36 AM  Result Value Ref Range Status   Toxigenic C Difficile by pcr NEGATIVE NEGATIVE Final    Coagulation Studies: No results for input(s): LABPROT, INR in the last 72 hours.  Urinalysis: No results for input(s): COLORURINE, LABSPEC, PHURINE, GLUCOSEU, HGBUR, BILIRUBINUR, KETONESUR, PROTEINUR, UROBILINOGEN, NITRITE, LEUKOCYTESUR in the last 72 hours.  Invalid input(s): APPERANCEUR    Imaging: Ct Angio Chest Pe W/cm &/or Wo Cm  11/21/2015  CLINICAL DATA:  Blood in sputum. Concern for pulmonary embolism. Dialysis patient. COPD, asthma, and coronary disease. EXAM: CT ANGIOGRAPHY CHEST WITH CONTRAST TECHNIQUE: Multidetector CT imaging of the chest was performed using the standard protocol during bolus administration of intravenous contrast. Multiplanar CT image reconstructions and MIPs were obtained to evaluate the vascular anatomy. CONTRAST:  44mL OMNIPAQUE IOHEXOL 350 MG/ML SOLN COMPARISON:  Chest CT 10/10/2015 FINDINGS: Mediastinum/Nodes: No filling defects within the pulmonary to suggest acute pulmonary embolism. No acute findings aorta or great vessels. No pericardial effusion. Mild mediastinal lymphadenopathy. No axillary or supraclavicular adenopathy. Lungs/Pleura: Large bilateral layering pleural effusions. Passive atelectasis of the LEFT and RIGHT lower lobe. There is patchy airspace disease in the RIGHT upper lobe which reaches confluence at the apex with masslike consolidation measuring 3.5 by 2.6 cm (image 20, series 6). Some patchy airspace disease in the inferior lingula additionally. Upper abdomen: Limited  view of the liver, kidneys, pancreas are unremarkable. Normal adrenal glands. Musculoskeletal: Diffuse sclerosis the bones.  Acute findings. Review of the MIP images confirms the above findings. IMPRESSION: 1. No acute pulmonary embolism. 2. Large bilateral pleural effusions and lower lobe atelectasis. 3. Bilateral patchy airspace disease suggesting MULTIFOCAL PNEUMONIA versus aspiration pneumonitis. 4. Consolidation in the RIGHT upper low most suggestive of pulmonary infection. 5. Mild mediastinal adenopathy favored reactive. Electronically Signed   By: Suzy Bouchard M.D.   On: 11/21/2015 15:17     Medications:     . acidophilus  2 capsule Oral TID  . amiodarone  200 mg Oral Daily  . atorvastatin  20 mg Oral QPM  . bacitracin   Topical BID  . chlorhexidine gluconate  15 mL Mouth Rinse BID  . cholecalciferol  400 Units Per Tube Daily  . citalopram  20 mg Oral Daily  . clopidogrel  75 mg Oral Daily  . guaiFENesin  600 mg Oral BID  . insulin aspart  0-5 Units Subcutaneous QHS  . insulin aspart  0-9 Units Subcutaneous TID WC  . ipratropium-albuterol  3 mL Nebulization TID  . [START ON 11/24/2015] levofloxacin (LEVAQUIN) IV  500 mg Intravenous Q48H  . lipase/protease/amylase  12,000 Units Oral TID WC  . pantoprazole  40 mg Oral Q1200  . potassium chloride  40 mEq Oral Once  . vitamin B-12  1,000 mcg Oral Daily   acetaminophen **OR** acetaminophen, albuterol, alum & mag hydroxide-simeth, bisacodyl, dextrose, iohexol, nitroGLYCERIN, senna-docusate, traMADol  Assessment/ Plan:  55 y.o. 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    [facility] MWF [place]  1. End Stage Renal Disease: MWF -Patient seen during dialysis Tolerating well   2. Anemia of chronic kidney disease:  - Hgb  10.3, holding off on epogen given right renal masses x 2.  3.  Acute resp failure - combination of Pneumonia, Anasarca, large pleural effusions -  Doing better now   4.  Hypokalemia - K  Normal now  5. Gastroparesis, Nausea and vomiting - symptomatic management  6.  Renal mass x 2.  Suggestive of small and early RCC.   Patient will need to f/u with urology.        LOS:  [name] 3/3/20171:28 PM

## 2015-11-23 NOTE — Progress Notes (Signed)
Pharmacy Antibiotic Note  [name] is a 55 y.o. female admitted on 11/12/2015 with pneumonia.  Pharmacy has been consulted for levofloxacin dosing.  Plan: Levofloxacin  500 mg IV q48h.   Height: 5\' 5"  (165.1 cm) Weight: 150 lb 5.7 oz (68.2 kg) IBW/kg (Calculated) : 57  Temp (24hrs), Avg:97.7 F (36.5 C), Min:97.4 F (36.3 C), Max:97.9 F (36.6 C)   Recent Labs Lab 11/18/15 0519 11/19/15 0529 11/20/15 0547 11/21/15 0445 11/22/15 0540 11/23/15 0501  WBC 21.3* 25.5*  --  21.2* 15.8* 14.5*  CREATININE 3.45* 2.29* 1.79*  --  2.34* 3.11*    Estimated Creatinine Clearance: 18.2 mL/min (by C-G formula based on Cr of 3.11).    Allergies  Allergen Reactions  . Compazine [Prochlorperazine Edisylate] Anaphylaxis and Nausea And Vomiting  . Ace Inhibitors Swelling  . Ativan [Lorazepam] Other (See Comments)    Reaction:  Hallucinations and headaches  . Codeine Nausea And Vomiting  . Gabapentin Other (See Comments)    Reaction:  Unknown   . Losartan Other (See Comments)    Reaction:  Unknown   . Ondansetron Other (See Comments)    Reaction:  Unknown   . Prochlorperazine Other (See Comments)    Reaction:  Unknown   . Scopolamine Other (See Comments)    Reaction:  Unknown   . Zofran [Ondansetron Hcl] Other (See Comments)    Reaction:  hallucinations   . Oxycodone Anxiety  . Tape Rash   Pharmacy will continue to monitor and adjust per consult.   [name] L 11/23/2015 6:54 PM

## 2015-11-23 NOTE — Progress Notes (Signed)
Nutrition Follow-up     INTERVENTION:   Meals and Snacks: Cater to patient preferences Medical Food Supplement Therapy: continue Mighty Shakes TID, Magic Cup BID   NUTRITION DIAGNOSIS:   Inadequate oral intake related to altered GI function as evidenced by per patient/family report.  GOAL:   Patient will meet greater than or equal to 90% of their needs  MONITOR:    (Energy intake, Digestive system)  REASON FOR ASSESSMENT:   Consult Enteral/tube feeding initiation and management  ASSESSMENT:    Pt with bilateral pleural effusions and pneumonia, s/p extubation, receiving scheduled dialysis; pt lethargic at times, sleeping on visit today, shook pt and she woke up and then fell back asleep  Diet Order:  DIET DYS 2 Room service appropriate?: Yes with Assist; Fluid consistency:: Nectar Thick  Energy Intake: recorded po intake of fluids documented but no recorded po intake of meal trays; per Abbie RN, pt did not eat breakfast this AM due to lethargy, waiting on lunch tray as pt missed lunch due to dialysis. Pt requires feeding assistance at meal times. Abbie RN reports CNA indicating that pt has been eating some of her meal trays on previous days  Skin:   (stage II pressure ulcer sacrum)    Recent Labs Lab 11/17/15 0507 11/17/15 1754  11/20/15 0547 11/22/15 0540 11/23/15 0501  NA 133*  --   < > 140 140 139  K 2.6* 3.4*  < > 3.3* 4.5 5.7*  CL 94*  --   < > 98* 100* 97*  CO2 30  --   < > 33* 34* 29  BUN 32*  --   < > 18 17 25*  CREATININE 2.81*  --   < > 1.79* 2.34* 3.11*  CALCIUM 8.0*  --   < > 8.5* 8.3* 8.0*  MG 1.8  --   --   --   --   --   PHOS <1.0* 2.0*  --  1.6*  --   --   GLUCOSE 231*  --   < > 179* 150* 84  < > = values in this interval not displayed.  Glucose Profile:  Recent Labs  11/23/15 1433 11/23/15 1506 11/23/15 1605  GLUCAP 65 65 105*   Meds: ss novolog, acidophilus, creon  Height:   Ht Readings from Last 1 Encounters:  11/12/15 5\' 5"   (1.651 m)    Weight:   Wt Readings from Last 1 Encounters:  11/23/15 150 lb 5.7 oz (68.2 kg)   Filed Weights   11/23/15 0428 11/23/15 0945 11/23/15 1355  Weight: 151 lb 14.4 oz (68.9 kg) 151 lb 14.4 oz (68.9 kg) 150 lb 5.7 oz (68.2 kg)    BMI:  Body mass index is 25.02 kg/(m^2).  Estimated Nutritional Needs:   Kcal:  1887 kcals (Ve: 9.1, Tmax: 39.2) using wt of 73 kg  Protein:  110-146 g(1.5-2.0 g/kg)   Fluid:  (1047ml + UOP)  EDUCATION NEEDS:   No education needs identified at this time   HIGH Care Level  Kerman Passey MS, RD, LDN [phone] Pager  [phone] Weekend/On-Call Pager

## 2015-11-23 NOTE — Clinical Social Work Note (Signed)
[facility] has made an offer to take patient. CSW attempted to make offer to patient this afternoon but she was lethargic and did not awaken. CSW over weekend will attempt to speak with patient and make bed offer. Shela Leff MSW,LCSW [phone]

## 2015-11-23 NOTE — Progress Notes (Signed)
Patient's CBG was 65 - gave nectar thick apple juice with sugar added, pt also ate some applesauce. Did a recheck and was still 28. Will recheck and continue to monitor.

## 2015-11-23 NOTE — Progress Notes (Signed)
Post hd tx 

## 2015-11-24 ENCOUNTER — Inpatient Hospital Stay: Payer: Medicare Other

## 2015-11-24 LAB — GLUCOSE, CAPILLARY
Glucose-Capillary: 76 mg/dL (ref 65–99)
Glucose-Capillary: 108 mg/dL — ABNORMAL HIGH (ref 65–99)
Glucose-Capillary: 130 mg/dL — ABNORMAL HIGH (ref 65–99)
Glucose-Capillary: 89 mg/dL (ref 65–99)
Glucose-Capillary: 69 mg/dL (ref 65–99)

## 2015-11-24 MED ORDER — ALBUMIN HUMAN 25 % IV SOLN
25.0000 g | Freq: Once | INTRAVENOUS | Status: AC
  Administered 2015-11-24: 25 g via INTRAVENOUS
  Filled 2015-11-24: qty 100

## 2015-11-24 NOTE — Progress Notes (Signed)
Subjective:  Doing better today Asking about going home Feels depressed because she is not able to contact her family like she wants to     Objective:  Vital signs in last 24 hours:  Temp:  [97.6 F (36.4 C)-98.2 F (36.8 C)] 98.2 F (36.8 C) (03/04 0539) Pulse Rate:  [68-80] 76 (03/04 0817) Resp:  [13-20] 20 (03/04 0817) BP: (148-166)/(51-76) 153/61 mmHg (03/04 0817) SpO2:  [99 %-100 %] 100 % (03/04 0817) Weight:  [68.2 kg (150 lb 5.7 oz)] 68.2 kg (150 lb 5.7 oz) (03/03 1355)  Weight change: 0 kg (0 lb) Filed Weights   11/23/15 0428 11/23/15 0945 11/23/15 1355  Weight: 68.9 kg (151 lb 14.4 oz) 68.9 kg (151 lb 14.4 oz) 68.2 kg (150 lb 5.7 oz)    Intake/Output:    Intake/Output Summary (Last 24 hours) at 11/24/15 1240 Last data filed at 11/24/15 0926  Gross per 24 hour  Intake    200 ml  Output    500 ml  Net   -300 ml     Physical Exam: General: No acute distress  HEENT Anicteric, dry oral mucus membranes  Neck supple  Pulm/lungs Scattered rhonchi, normal effort, + basilar crackles  CV S1S2 no rubs  Abdomen:  Soft, Non tender, BS present  Extremities: Trace b/l LE edema  Neurologic: Awake, alert, follows commands  Skin: No acute rashes  Access: LUE AVF       Basic Metabolic Panel:   Recent Labs Lab 11/17/15 1754  11/18/15 0519 11/19/15 0529 11/20/15 0547 11/22/15 0540 11/23/15 0501  NA  --   --  133* 138 140 140 139  K 3.4*  --  3.0* 3.3* 3.3* 4.5 5.7*  CL  --   --  93* 97* 98* 100* 97*  CO2  --   --  30 33* 33* 34* 29  GLUCOSE  --   --  117* 134* 179* 150* 84  BUN  --   --  51* 29* 18 17 25*  CREATININE  --   --  3.45* 2.29* 1.79* 2.34* 3.11*  CALCIUM  --   < > 8.5* 8.5* 8.5* 8.3* 8.0*  PHOS 2.0*  --   --   --  1.6*  --   --   < > = values in this interval not displayed.   CBC:  Recent Labs Lab 11/18/15 0519 11/19/15 0529 11/21/15 0445 11/22/15 0540 11/23/15 0501  WBC 21.3* 25.5* 21.2* 15.8* 14.5*  HGB 9.6* 9.4* 10.1* 9.8* 10.3*   HCT 29.5* 29.3* 31.8* 30.1* 32.9*  MCV 82.3 83.2 84.5 82.8 84.6  PLT 82* 90* 211 245 270      Microbiology:  Recent Results (from the past 720 hour(s))  MRSA PCR Screening     Status: None   Collection Time: 11/13/15  9:46 PM  Result Value Ref Range Status   MRSA by PCR NEGATIVE NEGATIVE Final    Comment:        The GeneXpert MRSA Assay (FDA approved for NASAL specimens only), is one component of a comprehensive MRSA colonization surveillance program. It is not intended to diagnose MRSA infection nor to guide or monitor treatment for MRSA infections.   Culture, blood (Routine X 2) w Reflex to ID Panel     Status: None   Collection Time: 11/14/15 12:53 AM  Result Value Ref Range Status   Specimen Description BLOOD RIGHT HAND  Final   Special Requests BOTTLES DRAWN AEROBIC AND ANAEROBIC 5ML  Final  Culture NO GROWTH 6 DAYS  Final   Report Status 11/20/2015 FINAL  Final  Culture, blood (Routine X 2) w Reflex to ID Panel     Status: None   Collection Time: 11/14/15  7:14 AM  Result Value Ref Range Status   Specimen Description BLOOD RIGHT HAND  Final   Special Requests   Final    BOTTLES DRAWN AEROBIC AND ANAEROBIC AER 4ML ANA 2ML   Culture NO GROWTH 6 DAYS  Final   Report Status 11/20/2015 FINAL  Final  Urine culture     Status: None   Collection Time: 11/14/15 10:16 AM  Result Value Ref Range Status   Specimen Description URINE, RANDOM  Final   Special Requests NONE  Final   Culture NO GROWTH 1 DAY  Final   Report Status 11/15/2015 FINAL  Final  Culture, expectorated sputum-assessment     Status: None   Collection Time: 11/14/15  4:20 PM  Result Value Ref Range Status   Specimen Description SPU  Final   Special Requests NONE  Final   Sputum evaluation THIS SPECIMEN IS ACCEPTABLE FOR SPUTUM CULTURE  Final   Report Status 11/14/2015 FINAL  Final  Culture, respiratory (NON-Expectorated)     Status: None   Collection Time: 11/14/15  4:20 PM  Result Value Ref Range  Status   Specimen Description SPU  Final   Special Requests NONE Reflexed from [id number]  Final   Gram Stain   Final    MANY WBC SEEN NO SQUAMOUS EPITHELIAL CELLS PRESENT FEW GRAM POSITIVE COCCI EXCELLENT SPECIMEN - 90-100% WBCS    Culture   Final    LIGHT GROWTH ENTEROBACTER CLOACAE MODERATE GROWTH STREPTOCOCCUS PNEUMONIAE    Report Status 11/18/2015 FINAL  Final   Organism ID, Bacteria ENTEROBACTER CLOACAE  Final   Organism ID, Bacteria STREPTOCOCCUS PNEUMONIAE  Final      Susceptibility   Streptococcus pneumoniae - MIC*    ERYTHROMYCIN <=0.12 SENSITIVE Sensitive     LEVOFLOXACIN 1 SENSITIVE Sensitive     VANCOMYCIN 0.5 SENSITIVE Sensitive     PENICILLIN <=0.06 SENSITIVE Sensitive     CEFTRIAXONE <=0.12 SENSITIVE Sensitive     * MODERATE GROWTH STREPTOCOCCUS PNEUMONIAE   Enterobacter cloacae - MIC*    PIP/TAZO Value in next row Sensitive      SENSITIVE8    CEFAZOLIN Value in next row Resistant      RESISTANT>=64    CEFEPIME Value in next row Sensitive      SENSITIVE<=1    CEFTAZIDIME Value in next row Sensitive      SENSITIVE<=1    CEFTRIAXONE Value in next row Sensitive      SENSITIVE<=1    CIPROFLOXACIN Value in next row Sensitive      SENSITIVE<=0.25    GENTAMICIN Value in next row Sensitive      SENSITIVE<=1    IMIPENEM Value in next row Sensitive      SENSITIVE1    TRIMETH/SULFA Value in next row Sensitive      SENSITIVE<=20    * LIGHT GROWTH ENTEROBACTER CLOACAE  C difficile quick scan w PCR reflex     Status: Abnormal   Collection Time: 11/15/15  7:36 AM  Result Value Ref Range Status   C Diff antigen POSITIVE (A) NEGATIVE Final   C Diff toxin NEGATIVE NEGATIVE Final   C Diff interpretation   Final    Negative for toxigenic C. difficile. Toxin gene and active toxin production not detected. May be a nontoxigenic  strain of C. difficile bacteria present, lacking the ability to produce toxin.  Clostridium Difficile by PCR     Status: None   Collection Time:  11/15/15  7:36 AM  Result Value Ref Range Status   Toxigenic C Difficile by pcr NEGATIVE NEGATIVE Final    Coagulation Studies: No results for input(s): LABPROT, INR in the last 72 hours.  Urinalysis: No results for input(s): COLORURINE, LABSPEC, PHURINE, GLUCOSEU, HGBUR, BILIRUBINUR, KETONESUR, PROTEINUR, UROBILINOGEN, NITRITE, LEUKOCYTESUR in the last 72 hours.  Invalid input(s): APPERANCEUR    Imaging: Dg Chest 2 View  11/24/2015  CLINICAL DATA:  End-stage renal disease. Follow-up of pleural effusion. EXAM: CHEST  2 VIEW COMPARISON:  CT of 11/21/2015.  Plain film of 11/18/2015. FINDINGS: Extubation since the prior plain film. A left-sided subclavian line terminates at the cavoatrial junction. Left sided axillary stent. Midline trachea. Cardiomegaly accentuated by AP portable technique. left-sided pleural effusion is similar, small to moderate. Right-sided pleural effusion is slightly increased, moderate. No pneumothorax. Interstitial edema is moderate and increased. Accentuated by AP portable technique. Bibasilar airspace disease persists. IMPRESSION: Slight increase in moderate congestive heart failure since 11/18/2015. Increased right and similar left -sided pleural effusions. Bibasilar airspace disease which could represent atelectasis or infection. Electronically Signed   By: Abigail Miyamoto M.D.   On: 11/24/2015 11:58     Medications:     . acidophilus  2 capsule Oral TID  . albumin human  25 g Intravenous Once  . amiodarone  200 mg Oral Daily  . atorvastatin  20 mg Oral QPM  . bacitracin   Topical BID  . chlorhexidine gluconate  15 mL Mouth Rinse BID  . cholecalciferol  400 Units Per Tube Daily  . citalopram  20 mg Oral Daily  . clopidogrel  75 mg Oral Daily  . guaiFENesin  600 mg Oral BID  . insulin aspart  0-5 Units Subcutaneous QHS  . insulin aspart  0-9 Units Subcutaneous TID WC  . levofloxacin (LEVAQUIN) IV  500 mg Intravenous Q48H  . lipase/protease/amylase  12,000  Units Oral TID WC  . pantoprazole  40 mg Oral Q1200  . potassium chloride  40 mEq Oral Once  . vitamin B-12  1,000 mcg Oral Daily   acetaminophen **OR** acetaminophen, albuterol, alum & mag hydroxide-simeth, bisacodyl, dextrose, iohexol, nitroGLYCERIN, senna-docusate, traMADol  Assessment/ Plan:  55 y.o. 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    [facility] MWF [place]  1. End Stage Renal Disease: MWF -Extra treatment of dialysis today for volume removal  2. Anemia of chronic kidney disease:  - Hgb 10.3, holding off on epogen given right renal masses x 2.  3.  Acute resp failure - combination of Pneumonia, Anasarca, large pleural effusions -  Doing better now   4.  Hypokalemia - K  Normal now  5. Gastroparesis, Nausea and vomiting - symptomatic management  6.  Renal mass x 2.  Two small enhancing masses in upper pole of right kidney, suspicious for small renal cell carcinomas seen on abdominal CT with IV contrast on February 20 Patient will need to f/u with urology.        LOS:  [name] 3/4/201712:40 PM

## 2015-11-24 NOTE — Progress Notes (Signed)
[place] at [place] NAME: [name]    MR#:  [id number]  DATE OF BIRTH:  08-Apr-1961  SUBJECTIVE:   Awake. Decreased hearing. Has pleuritic chest pain. Poor historian Cough.  Blood in sputum?  REVIEW OF SYSTEMS:    Review of Systems  Unable to perform ROS  Tolerating Diet: Yes  DRUG ALLERGIES:   Allergies  Allergen Reactions  . Compazine [Prochlorperazine Edisylate] Anaphylaxis and Nausea And Vomiting  . Ace Inhibitors Swelling  . Ativan [Lorazepam] Other (See Comments)    Reaction:  Hallucinations and headaches  . Codeine Nausea And Vomiting  . Gabapentin Other (See Comments)    Reaction:  Unknown   . Losartan Other (See Comments)    Reaction:  Unknown   . Ondansetron Other (See Comments)    Reaction:  Unknown   . Prochlorperazine Other (See Comments)    Reaction:  Unknown   . Scopolamine Other (See Comments)    Reaction:  Unknown   . Zofran [Ondansetron Hcl] Other (See Comments)    Reaction:  hallucinations   . Oxycodone Anxiety  . Tape Rash    VITALS:  Blood pressure 153/61, pulse 76, temperature 98.2 F (36.8 C), temperature source Oral, resp. rate 20, height 5\' 5"  (1.651 m), weight 68.2 kg (150 lb 5.7 oz), SpO2 100 %.  PHYSICAL EXAMINATION:   Physical Exam  Constitutional: She is well-developed, well-nourished, and in no distress. No distress.  HENT:  Head: Normocephalic.  Nose: Nose normal.  Decreased hearing  Eyes: No scleral icterus.  Neck: No JVD present. No tracheal deviation present.  Cardiovascular: Normal rate, regular rhythm and normal heart sounds.  Exam reveals no gallop and no friction rub.   No murmur heard. Chest wall tenderness  Pulmonary/Chest: Effort normal. No respiratory distress. She has decreased breath sounds in the right lower field and the left lower field. She has no wheezes. She has no rales. She exhibits no tenderness.  Abdominal: Soft. Bowel sounds are normal. She  exhibits no distension and no mass. There is no tenderness. There is no rebound and no guarding.  Musculoskeletal: Normal range of motion. She exhibits no edema.  Skin: Skin is warm. No rash noted. No erythema.    LABORATORY PANEL:   CBC  Recent Labs Lab 11/23/15 0501  WBC 14.5*  HGB 10.3*  HCT 32.9*  PLT 270   ------------------------------------------------------------------------------------------------------------------  Chemistries   Recent Labs Lab 11/23/15 0501  NA 139  K 5.7*  CL 97*  CO2 29  GLUCOSE 84  BUN 25*  CREATININE 3.11*  CALCIUM 8.0*   ------------------------------------------------------------------------------------------------------------------  Cardiac Enzymes No results for input(s): TROPONINI in the last 168 hours. ------------------------------------------------------------------------------------------------------------------  RADIOLOGY:  No results found.   ASSESSMENT AND PLAN:   55 year old female with end-stage renal disease on hemodialysis, diabetes with retinopathy, gastroparesis and neuropathy who initially presented with abdominal pain and subsequently has acute respiratory failure secondary to pneumonia.  # Bilateral pneumonia with Streptococcus and enterobacter On levaquin. Will need this till 11/30/2015. Appreciate ID help.  # Acute respiratory failure due to pneumonia and pulmonary edema with bilateral large pleural effusions Patient is status post extubation. CT scan shows no acute pulmonary emboli however did show large bilateral pleural effusions and multifocal pneumonia.  # Acute on chronic diastolic heart failure in the setting of ESRD - Improved Continue dialysis. Repeat CXR.  # Septic shock: Due to C. difficile and pneumonia Resolved  # Diabetic Gastroparesis Continue Phenergan and Reglan  #  ESRD on hemodialysis: Continue dialysis Monday, Wednesday and Friday. Discussed with Dr. Candiss Norse  # Renal mass  -Suggestive of early RCC. Urology consult requested and will need outpatient follow-up with urology   #Type 2 diabetes with gastroparesis: Sliding scale insulin  #Dysphagia:  as per speech she has signs and symptoms of chronic aspiration   Dysphagia 2 (fine chop);Nectar-thick liquid with aspiration precautions  Management plans discussed with the patient and she is in agreement.  CODE STATUS: FULL  TOTAL TIME TAKING CARE OF THIS PATIENT: 30 minutes.   POSSIBLE D/C 1-2 days to SNF, DEPENDING ON CLINICAL CONDITION.  [name] R M.D on 11/24/2015 at 10:22 AM  Between 7am to 6pm - Pager - [phone]  After 6pm go to [email or web address] - password [email or web address] [place]  Office  [phone]  CC: Primary care physician; Ellamae Sia, MD  Note: This dictation was prepared with Dragon dictation along with smaller phrase technology. Any transcriptional errors that result from this process are unintentional.

## 2015-11-24 NOTE — Progress Notes (Signed)
Pt with c/o a dull chest pain in her left chest and radiates towards the shoulder. VS were checked and were stable, no notable changes on her continuos tele. MD notified and ordered EKG. Will also give pain medication and reflux medication as this relieved her symptoms the last time she had a similar episode.

## 2015-11-25 LAB — GLUCOSE, CAPILLARY
Glucose-Capillary: 50 mg/dL — ABNORMAL LOW (ref 65–99)
Glucose-Capillary: 129 mg/dL — ABNORMAL HIGH (ref 65–99)
Glucose-Capillary: 93 mg/dL (ref 65–99)
Glucose-Capillary: 80 mg/dL (ref 65–99)
Glucose-Capillary: 93 mg/dL (ref 65–99)
Glucose-Capillary: 78 mg/dL (ref 65–99)
Glucose-Capillary: 78 mg/dL (ref 65–99)

## 2015-11-25 MED ORDER — HYDROCODONE-ACETAMINOPHEN 5-325 MG PO TABS
1.0000 | ORAL_TABLET | Freq: Four times a day (QID) | ORAL | Status: DC | PRN
  Administered 2015-11-25 – 2015-12-01 (×15): 1 via ORAL
  Filled 2015-11-25 (×16): qty 1

## 2015-11-25 MED ORDER — CHOLECALCIFEROL 10 MCG (400 UNIT) PO TABS
400.0000 [IU] | ORAL_TABLET | Freq: Every day | ORAL | Status: DC
  Administered 2015-11-26 – 2015-12-01 (×5): 400 [IU] via ORAL
  Filled 2015-11-25 (×6): qty 1

## 2015-11-25 NOTE — Progress Notes (Addendum)
Pt with increasing pain and nausea. Tramadol previously given but is not yet due. Md notified and order prn zofran and norco which pt typically takes at home, will d/c tramadol, given norco/zofran and monitor closely.  Pt is allergic to zofran, phenergan. Will not order these meds, pt prefers a thickened liquid and reflux med at this time instead for nausea, will give these and continue to monitor.

## 2015-11-25 NOTE — Clinical Social Work Note (Signed)
CSW spoke with patient this morning and she has accepted bed offer from [facility]. [facility] notified and CSW will facilitate discharge when time. Shela Leff MSW,LCSW [phone]

## 2015-11-25 NOTE — Progress Notes (Signed)
[place] at [place] NAME: [name]    MR#:  [id number]  DATE OF BIRTH:  October 07, 1960  SUBJECTIVE:   Awake. Her pleuritic chest pain has resolved. Poor appetite. On 2 L oxygen.  REVIEW OF SYSTEMS:    Review of Systems  Unable to perform ROS  Tolerating Diet: Yes  DRUG ALLERGIES:   Allergies  Allergen Reactions  . Compazine [Prochlorperazine Edisylate] Anaphylaxis and Nausea And Vomiting  . Ace Inhibitors Swelling  . Ativan [Lorazepam] Other (See Comments)    Reaction:  Hallucinations and headaches  . Codeine Nausea And Vomiting  . Gabapentin Other (See Comments)    Reaction:  Unknown   . Losartan Other (See Comments)    Reaction:  Unknown   . Ondansetron Other (See Comments)    Reaction:  Unknown   . Prochlorperazine Other (See Comments)    Reaction:  Unknown   . Scopolamine Other (See Comments)    Reaction:  Unknown   . Zofran [Ondansetron Hcl] Other (See Comments)    Reaction:  hallucinations   . Oxycodone Anxiety  . Tape Rash    VITALS:  Blood pressure 135/51, pulse 78, temperature 98 F (36.7 C), temperature source Oral, resp. rate 22, height 5\' 5"  (1.651 m), weight 66.2 kg (145 lb 15.1 oz), SpO2 99 %.  PHYSICAL EXAMINATION:   Physical Exam  Constitutional: She is well-developed, well-nourished, and in no distress. No distress.  HENT:  Head: Normocephalic.  Nose: Nose normal.  Decreased hearing  Eyes: No scleral icterus.  Neck: No JVD present. No tracheal deviation present.  Cardiovascular: Normal rate, regular rhythm and normal heart sounds.  Exam reveals no gallop and no friction rub.   No murmur heard. Chest wall tenderness  Pulmonary/Chest: Effort normal. No respiratory distress. She has decreased breath sounds in the right lower field and the left lower field. She has no wheezes. She has no rales. She exhibits no tenderness.  Abdominal: Soft. Bowel sounds are normal. She exhibits no distension and  no mass. There is no tenderness. There is no rebound and no guarding.  Musculoskeletal: Normal range of motion. She exhibits no edema.  Skin: Skin is warm. No rash noted. No erythema.    LABORATORY PANEL:   CBC  Recent Labs Lab 11/23/15 0501  WBC 14.5*  HGB 10.3*  HCT 32.9*  PLT 270   ------------------------------------------------------------------------------------------------------------------  Chemistries   Recent Labs Lab 11/23/15 0501  NA 139  K 5.7*  CL 97*  CO2 29  GLUCOSE 84  BUN 25*  CREATININE 3.11*  CALCIUM 8.0*   ------------------------------------------------------------------------------------------------------------------  Cardiac Enzymes No results for input(s): TROPONINI in the last 168 hours. ------------------------------------------------------------------------------------------------------------------  RADIOLOGY:  Dg Chest 2 View  11/24/2015  CLINICAL DATA:  End-stage renal disease. Follow-up of pleural effusion. EXAM: CHEST  2 VIEW COMPARISON:  CT of 11/21/2015.  Plain film of 11/18/2015. FINDINGS: Extubation since the prior plain film. A left-sided subclavian line terminates at the cavoatrial junction. Left sided axillary stent. Midline trachea. Cardiomegaly accentuated by AP portable technique. left-sided pleural effusion is similar, small to moderate. Right-sided pleural effusion is slightly increased, moderate. No pneumothorax. Interstitial edema is moderate and increased. Accentuated by AP portable technique. Bibasilar airspace disease persists. IMPRESSION: Slight increase in moderate congestive heart failure since 11/18/2015. Increased right and similar left -sided pleural effusions. Bibasilar airspace disease which could represent atelectasis or infection. Electronically Signed   By: Abigail Miyamoto M.D.   On: 11/24/2015 11:58  ASSESSMENT AND PLAN:   55 year old female with end-stage renal disease on hemodialysis, diabetes with  retinopathy, gastroparesis and neuropathy who initially presented with abdominal pain and subsequently has acute respiratory failure secondary to pneumonia.  # Bilateral pneumonia with Streptococcus and enterobacter On levaquin. Will need this till 11/30/2015. Appreciate ID help.  # Acute respiratory failure due to pneumonia and pulmonary edema with bilateral large pleural effusions Patient is status post extubation. CT scan shows no acute pulmonary emboli however did show large bilateral pleural effusions and multifocal pneumonia.  # Acute on chronic diastolic heart failure in the setting of ESRD - Improved Continue dialysis. Repeat CXR showed CHF.  # Septic shock: Due to C. difficile and pneumonia Resolved  # Diabetic Gastroparesis Continue Phenergan and Reglan  # ESRD on hemodialysis: Continue dialysis Monday, Wednesday and Friday. She had extra dialysis yesterday.  # Renal mass -Suggestive of early RCC. Urology consult requested and will need outpatient follow-up with urology   #Type 2 diabetes with gastroparesis: Sliding scale insulin  #Dysphagia:  as per speech she has signs and symptoms of chronic aspiration   Dysphagia 2 (fine chop);Nectar-thick liquid with aspiration precautions  Management plans discussed with the patient and she is in agreement.  CODE STATUS: FULL  TOTAL TIME TAKING CARE OF THIS PATIENT: 30 minutes.   Likely discharge tomorrow after dialysis to skilled nursing facility.  [name] R M.D on 11/25/2015 at 11:51 AM  Between 7am to 6pm - Pager - [phone]  After 6pm go to [email or web address] - password [email or web address] [place] [facility]  Office  [phone]  CC: Primary care physician; Ellamae Sia, MD  Note: This dictation was prepared with Dragon dictation along with smaller phrase technology. Any transcriptional errors that result from this process are unintentional.

## 2015-11-25 NOTE — Progress Notes (Signed)
Subjective:  No acute c/o when seen Asking about going home Breathing is better 2500 cc taken off with HD on saturday   Objective:  Vital signs in last 24 hours:  Temp:  [97.3 F (36.3 C)-98 F (36.7 C)] 97.3 F (36.3 C) (03/05 1300) Pulse Rate:  [75-84] 75 (03/05 1300) Resp:  [18-22] 19 (03/05 1300) BP: (129-149)/(51-68) 129/58 mmHg (03/05 1300) SpO2:  [99 %] 99 % (03/05 1300) Weight:  [66.2 kg (145 lb 15.1 oz)] 66.2 kg (145 lb 15.1 oz) (03/04 1730)  Weight change: -1.3 kg (-2 lb 13.9 oz) Filed Weights   11/23/15 1355 11/24/15 1400 11/24/15 1730  Weight: 68.2 kg (150 lb 5.7 oz) 67.6 kg (149 lb 0.5 oz) 66.2 kg (145 lb 15.1 oz)    Intake/Output:    Intake/Output Summary (Last 24 hours) at 11/25/15 1510 Last data filed at 11/25/15 1300  Gross per 24 hour  Intake    100 ml  Output      0 ml  Net    100 ml     Physical Exam: General: No acute distress  HEENT Anicteric, dry oral mucus membranes  Neck supple  Pulm/lungs Clear today  CV S1S2 no rubs  Abdomen:  Soft, Non tender, BS present  Extremities: Trace b/l LE edema  Neurologic: Awake, alert, follows commands  Skin: No acute rashes  Access: LUE AVF       Basic Metabolic Panel:   Recent Labs Lab 11/19/15 0529 11/20/15 0547 11/22/15 0540 11/23/15 0501  NA 138 140 140 139  K 3.3* 3.3* 4.5 5.7*  CL 97* 98* 100* 97*  CO2 33* 33* 34* 29  GLUCOSE 134* 179* 150* 84  BUN 29* 18 17 25*  CREATININE 2.29* 1.79* 2.34* 3.11*  CALCIUM 8.5* 8.5* 8.3* 8.0*  PHOS  --  1.6*  --   --      CBC:  Recent Labs Lab 11/19/15 0529 11/21/15 0445 11/22/15 0540 11/23/15 0501  WBC 25.5* 21.2* 15.8* 14.5*  HGB 9.4* 10.1* 9.8* 10.3*  HCT 29.3* 31.8* 30.1* 32.9*  MCV 83.2 84.5 82.8 84.6  PLT 90* 211 245 270      Microbiology:  Recent Results (from the past 720 hour(s))  MRSA PCR Screening     Status: None   Collection Time: 11/13/15  9:46 PM  Result Value Ref Range Status   MRSA by PCR NEGATIVE NEGATIVE  Final    Comment:        The GeneXpert MRSA Assay (FDA approved for NASAL specimens only), is one component of a comprehensive MRSA colonization surveillance program. It is not intended to diagnose MRSA infection nor to guide or monitor treatment for MRSA infections.   Culture, blood (Routine X 2) w Reflex to ID Panel     Status: None   Collection Time: 11/14/15 12:53 AM  Result Value Ref Range Status   Specimen Description BLOOD RIGHT HAND  Final   Special Requests BOTTLES DRAWN AEROBIC AND ANAEROBIC 5ML  Final   Culture NO GROWTH 6 DAYS  Final   Report Status 11/20/2015 FINAL  Final  Culture, blood (Routine X 2) w Reflex to ID Panel     Status: None   Collection Time: 11/14/15  7:14 AM  Result Value Ref Range Status   Specimen Description BLOOD RIGHT HAND  Final   Special Requests   Final    BOTTLES DRAWN AEROBIC AND ANAEROBIC AER 4ML ANA 2ML   Culture NO GROWTH 6 DAYS  Final  Report Status 11/20/2015 FINAL  Final  Urine culture     Status: None   Collection Time: 11/14/15 10:16 AM  Result Value Ref Range Status   Specimen Description URINE, RANDOM  Final   Special Requests NONE  Final   Culture NO GROWTH 1 DAY  Final   Report Status 11/15/2015 FINAL  Final  Culture, expectorated sputum-assessment     Status: None   Collection Time: 11/14/15  4:20 PM  Result Value Ref Range Status   Specimen Description SPU  Final   Special Requests NONE  Final   Sputum evaluation THIS SPECIMEN IS ACCEPTABLE FOR SPUTUM CULTURE  Final   Report Status 11/14/2015 FINAL  Final  Culture, respiratory (NON-Expectorated)     Status: None   Collection Time: 11/14/15  4:20 PM  Result Value Ref Range Status   Specimen Description SPU  Final   Special Requests NONE Reflexed from [id number]  Final   Gram Stain   Final    MANY WBC SEEN NO SQUAMOUS EPITHELIAL CELLS PRESENT FEW GRAM POSITIVE COCCI EXCELLENT SPECIMEN - 90-100% WBCS    Culture   Final    LIGHT GROWTH ENTEROBACTER CLOACAE MODERATE  GROWTH STREPTOCOCCUS PNEUMONIAE    Report Status 11/18/2015 FINAL  Final   Organism ID, Bacteria ENTEROBACTER CLOACAE  Final   Organism ID, Bacteria STREPTOCOCCUS PNEUMONIAE  Final      Susceptibility   Streptococcus pneumoniae - MIC*    ERYTHROMYCIN <=0.12 SENSITIVE Sensitive     LEVOFLOXACIN 1 SENSITIVE Sensitive     VANCOMYCIN 0.5 SENSITIVE Sensitive     PENICILLIN <=0.06 SENSITIVE Sensitive     CEFTRIAXONE <=0.12 SENSITIVE Sensitive     * MODERATE GROWTH STREPTOCOCCUS PNEUMONIAE   Enterobacter cloacae - MIC*    PIP/TAZO Value in next row Sensitive      SENSITIVE8    CEFAZOLIN Value in next row Resistant      RESISTANT>=64    CEFEPIME Value in next row Sensitive      SENSITIVE<=1    CEFTAZIDIME Value in next row Sensitive      SENSITIVE<=1    CEFTRIAXONE Value in next row Sensitive      SENSITIVE<=1    CIPROFLOXACIN Value in next row Sensitive      SENSITIVE<=0.25    GENTAMICIN Value in next row Sensitive      SENSITIVE<=1    IMIPENEM Value in next row Sensitive      SENSITIVE1    TRIMETH/SULFA Value in next row Sensitive      SENSITIVE<=20    * LIGHT GROWTH ENTEROBACTER CLOACAE  C difficile quick scan w PCR reflex     Status: Abnormal   Collection Time: 11/15/15  7:36 AM  Result Value Ref Range Status   C Diff antigen POSITIVE (A) NEGATIVE Final   C Diff toxin NEGATIVE NEGATIVE Final   C Diff interpretation   Final    Negative for toxigenic C. difficile. Toxin gene and active toxin production not detected. May be a nontoxigenic strain of C. difficile bacteria present, lacking the ability to produce toxin.  Clostridium Difficile by PCR     Status: None   Collection Time: 11/15/15  7:36 AM  Result Value Ref Range Status   Toxigenic C Difficile by pcr NEGATIVE NEGATIVE Final    Coagulation Studies: No results for input(s): LABPROT, INR in the last 72 hours.  Urinalysis: No results for input(s): COLORURINE, LABSPEC, PHURINE, GLUCOSEU, HGBUR, BILIRUBINUR, KETONESUR,  PROTEINUR, UROBILINOGEN, NITRITE, LEUKOCYTESUR in the last 72  hours.  Invalid input(s): APPERANCEUR    Imaging: Dg Chest 2 View  11/24/2015  CLINICAL DATA:  End-stage renal disease. Follow-up of pleural effusion. EXAM: CHEST  2 VIEW COMPARISON:  CT of 11/21/2015.  Plain film of 11/18/2015. FINDINGS: Extubation since the prior plain film. A left-sided subclavian line terminates at the cavoatrial junction. Left sided axillary stent. Midline trachea. Cardiomegaly accentuated by AP portable technique. left-sided pleural effusion is similar, small to moderate. Right-sided pleural effusion is slightly increased, moderate. No pneumothorax. Interstitial edema is moderate and increased. Accentuated by AP portable technique. Bibasilar airspace disease persists. IMPRESSION: Slight increase in moderate congestive heart failure since 11/18/2015. Increased right and similar left -sided pleural effusions. Bibasilar airspace disease which could represent atelectasis or infection. Electronically Signed   By: Abigail Miyamoto M.D.   On: 11/24/2015 11:58     Medications:     . acidophilus  2 capsule Oral TID  . amiodarone  200 mg Oral Daily  . atorvastatin  20 mg Oral QPM  . bacitracin   Topical BID  . chlorhexidine gluconate  15 mL Mouth Rinse BID  . [START ON 11/26/2015] cholecalciferol  400 Units Oral Daily  . citalopram  20 mg Oral Daily  . clopidogrel  75 mg Oral Daily  . guaiFENesin  600 mg Oral BID  . insulin aspart  0-5 Units Subcutaneous QHS  . insulin aspart  0-9 Units Subcutaneous TID WC  . levofloxacin (LEVAQUIN) IV  500 mg Intravenous Q48H  . lipase/protease/amylase  12,000 Units Oral TID WC  . pantoprazole  40 mg Oral Q1200  . potassium chloride  40 mEq Oral Once  . vitamin B-12  1,000 mcg Oral Daily   acetaminophen **OR** acetaminophen, albuterol, alum & mag hydroxide-simeth, bisacodyl, dextrose, HYDROcodone-acetaminophen, iohexol, nitroGLYCERIN, senna-docusate  Assessment/ Plan:  55 y.o.  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    [facility] MWF [place]  1. End Stage Renal Disease: MWF -Extra treatment of dialysis done on Saturday for volume removal 2.5 L was removed  2. Anemia of chronic kidney disease:  - Hgb 10.3, holding off on epogen given right renal masses x 2.  3.  Acute resp failure - combination of Pneumonia, Anasarca, large pleural effusions -  Doing better now   4.  Hyperkalemia - corrected with HD  5. Gastroparesis, Nausea and vomiting - symptomatic management  6.  Renal mass x 2.  Two small enhancing masses in upper pole of right kidney, suspicious for small renal cell carcinomas seen on abdominal CT with IV contrast on February 20 Patient will need to f/u with urology.        LOS:  [name] 3/5/20173:10 PM

## 2015-11-26 LAB — CBC WITH DIFFERENTIAL/PLATELET
Basophils Absolute: 0.1 10*3/uL (ref 0–0.1)
Basophils Relative: 1 %
Eosinophils Absolute: 0.3 10*3/uL (ref 0–0.7)
Eosinophils Relative: 5 %
HCT: 28.7 % — ABNORMAL LOW (ref 35.0–47.0)
Hemoglobin: 9.1 g/dL — ABNORMAL LOW (ref 12.0–16.0)
Lymphocytes Relative: 23 %
Lymphs Abs: 1.3 10*3/uL (ref 1.0–3.6)
MCH: 27.1 pg (ref 26.0–34.0)
MCHC: 31.8 g/dL — ABNORMAL LOW (ref 32.0–36.0)
MCV: 85.2 fL (ref 80.0–100.0)
Monocytes Absolute: 0.9 10*3/uL (ref 0.2–0.9)
Monocytes Relative: 16 %
Neutro Abs: 3.3 10*3/uL (ref 1.4–6.5)
Neutrophils Relative %: 55 %
Platelets: 234 10*3/uL (ref 150–440)
RBC: 3.37 MIL/uL — ABNORMAL LOW (ref 3.80–5.20)
RDW: 20.8 % — ABNORMAL HIGH (ref 11.5–14.5)
WBC: 5.9 10*3/uL (ref 3.6–11.0)

## 2015-11-26 LAB — BASIC METABOLIC PANEL
Anion gap: 7 (ref 5–15)
BUN: 13 mg/dL (ref 6–20)
CO2: 33 mmol/L — ABNORMAL HIGH (ref 22–32)
Calcium: 8.1 mg/dL — ABNORMAL LOW (ref 8.9–10.3)
Chloride: 97 mmol/L — ABNORMAL LOW (ref 101–111)
Creatinine, Ser: 3.07 mg/dL — ABNORMAL HIGH (ref 0.44–1.00)
GFR calc Af Amer: 18 mL/min — ABNORMAL LOW (ref >60)
GFR calc non Af Amer: 16 mL/min — ABNORMAL LOW (ref >60)
Glucose, Bld: 62 mg/dL — ABNORMAL LOW (ref 65–99)
Potassium: 4.2 mmol/L (ref 3.5–5.1)
Sodium: 137 mmol/L (ref 135–145)

## 2015-11-26 LAB — GLUCOSE, CAPILLARY
Glucose-Capillary: 48 mg/dL — ABNORMAL LOW (ref 65–99)
Glucose-Capillary: 52 mg/dL — ABNORMAL LOW (ref 65–99)
Glucose-Capillary: 53 mg/dL — ABNORMAL LOW (ref 65–99)
Glucose-Capillary: 55 mg/dL — ABNORMAL LOW (ref 65–99)
Glucose-Capillary: 140 mg/dL — ABNORMAL HIGH (ref 65–99)
Glucose-Capillary: 162 mg/dL — ABNORMAL HIGH (ref 65–99)
Glucose-Capillary: 46 mg/dL — ABNORMAL LOW (ref 65–99)
Glucose-Capillary: 48 mg/dL — ABNORMAL LOW (ref 65–99)
Glucose-Capillary: 74 mg/dL (ref 65–99)

## 2015-11-26 NOTE — Progress Notes (Signed)
Pre-hd tx 

## 2015-11-26 NOTE — Progress Notes (Signed)
[facility] INFECTIOUS DISEASE PROGRESS NOTE Date of Admission:  11/12/2015     ID: [name] is a 55 y.o. female with PNA Active Problems:   Pressure ulcer   Nausea   High temperature   Pulmonary edema   Encounter for central line placement   Encounter for orogastric (OG) tube placement   Acute renal failure ([place])   Respiratory failure ([facility])   Subjective: Sitting up at side of bed, feels a good bit better. No fevers.   ROS  Eleven systems are reviewed and negative except per hpi  Medications:  Antibiotics Given (last 72 hours)    Date/Time Action Medication Dose Rate   11/24/15 1826 Given   levofloxacin (LEVAQUIN) IVPB 500 mg 500 mg 100 mL/hr     . acidophilus  2 capsule Oral TID  . amiodarone  200 mg Oral Daily  . atorvastatin  20 mg Oral QPM  . bacitracin   Topical BID  . chlorhexidine gluconate  15 mL Mouth Rinse BID  . cholecalciferol  400 Units Oral Daily  . citalopram  20 mg Oral Daily  . clopidogrel  75 mg Oral Daily  . guaiFENesin  600 mg Oral BID  . insulin aspart  0-5 Units Subcutaneous QHS  . insulin aspart  0-9 Units Subcutaneous TID WC  . levofloxacin (LEVAQUIN) IV  500 mg Intravenous Q48H  . lipase/protease/amylase  12,000 Units Oral TID WC  . pantoprazole  40 mg Oral Q1200  . vitamin B-12  1,000 mcg Oral Daily    Objective: Vital signs in last 24 hours: Temp:  [97.4 F (36.3 C)-97.8 F (36.6 C)] 97.8 F (36.6 C) (03/06 1330) Pulse Rate:  [66-72] 71 (03/06 1330) Resp:  [10-22] 16 (03/06 1330) BP: (143-164)/(58-71) 159/63 mmHg (03/06 1330) SpO2:  [99 %-100 %] 100 % (03/06 1330) Weight:  [66.5 kg (146 lb 9.7 oz)-68 kg (149 lb 14.6 oz)] 66.5 kg (146 lb 9.7 oz) (03/06 1330) Constitutional: slowed mentation, nad.  HENT: [place]/AT, PERRLA, no scleral icterus Mouth/Throat: Oropharynx - thick mucus Cardiovascular: RRR Pulmonary/Chest: Bil rhonchi and decreased BS Neck = supple, no nuchal rigidity Abdominal: Soft. Bowel sounds are normal.  exhibits no distension. There is no tenderness.  Lymphadenopathy: no cervical adenopathy. No axillary adenopathy Neurological: slowed mentation  Skin: Skin is warm and dry. No rash noted. No erythema.  Psychiatric: flat affect  Lab Results  Recent Labs  11/26/15 0457  WBC 5.9  HGB 9.1*  HCT 28.7*  NA 137  K 4.2  CL 97*  CO2 33*  BUN 13  CREATININE 3.07*    Microbiology: Results for orders placed or performed during the hospital encounter of 11/12/15  MRSA PCR Screening     Status: None   Collection Time: 11/13/15  9:46 PM  Result Value Ref Range Status   MRSA by PCR NEGATIVE NEGATIVE Final    Comment:        The GeneXpert MRSA Assay (FDA approved for NASAL specimens only), is one component of a comprehensive MRSA colonization surveillance program. It is not intended to diagnose MRSA infection nor to guide or monitor treatment for MRSA infections.   Culture, blood (Routine X 2) w Reflex to ID Panel     Status: None   Collection Time: 11/14/15 12:53 AM  Result Value Ref Range Status   Specimen Description BLOOD RIGHT HAND  Final   Special Requests BOTTLES DRAWN AEROBIC AND ANAEROBIC 5ML  Final   Culture NO GROWTH 6 DAYS  Final  Report Status 11/20/2015 FINAL  Final  Culture, blood (Routine X 2) w Reflex to ID Panel     Status: None   Collection Time: 11/14/15  7:14 AM  Result Value Ref Range Status   Specimen Description BLOOD RIGHT HAND  Final   Special Requests   Final    BOTTLES DRAWN AEROBIC AND ANAEROBIC AER 4ML ANA 2ML   Culture NO GROWTH 6 DAYS  Final   Report Status 11/20/2015 FINAL  Final  Urine culture     Status: None   Collection Time: 11/14/15 10:16 AM  Result Value Ref Range Status   Specimen Description URINE, RANDOM  Final   Special Requests NONE  Final   Culture NO GROWTH 1 DAY  Final   Report Status 11/15/2015 FINAL  Final  Culture, expectorated sputum-assessment     Status: None   Collection Time: 11/14/15  4:20 PM  Result Value Ref  Range Status   Specimen Description SPU  Final   Special Requests NONE  Final   Sputum evaluation THIS SPECIMEN IS ACCEPTABLE FOR SPUTUM CULTURE  Final   Report Status 11/14/2015 FINAL  Final  Culture, respiratory (NON-Expectorated)     Status: None   Collection Time: 11/14/15  4:20 PM  Result Value Ref Range Status   Specimen Description SPU  Final   Special Requests NONE Reflexed from [id number]  Final   Gram Stain   Final    MANY WBC SEEN NO SQUAMOUS EPITHELIAL CELLS PRESENT FEW GRAM POSITIVE COCCI EXCELLENT SPECIMEN - 90-100% WBCS    Culture   Final    LIGHT GROWTH ENTEROBACTER CLOACAE MODERATE GROWTH STREPTOCOCCUS PNEUMONIAE    Report Status 11/18/2015 FINAL  Final   Organism ID, Bacteria ENTEROBACTER CLOACAE  Final   Organism ID, Bacteria STREPTOCOCCUS PNEUMONIAE  Final      Susceptibility   Streptococcus pneumoniae - MIC*    ERYTHROMYCIN <=0.12 SENSITIVE Sensitive     LEVOFLOXACIN 1 SENSITIVE Sensitive     VANCOMYCIN 0.5 SENSITIVE Sensitive     PENICILLIN <=0.06 SENSITIVE Sensitive     CEFTRIAXONE <=0.12 SENSITIVE Sensitive     * MODERATE GROWTH STREPTOCOCCUS PNEUMONIAE   Enterobacter cloacae - MIC*    PIP/TAZO Value in next row Sensitive      SENSITIVE8    CEFAZOLIN Value in next row Resistant      RESISTANT>=64    CEFEPIME Value in next row Sensitive      SENSITIVE<=1    CEFTAZIDIME Value in next row Sensitive      SENSITIVE<=1    CEFTRIAXONE Value in next row Sensitive      SENSITIVE<=1    CIPROFLOXACIN Value in next row Sensitive      SENSITIVE<=0.25    GENTAMICIN Value in next row Sensitive      SENSITIVE<=1    IMIPENEM Value in next row Sensitive      SENSITIVE1    TRIMETH/SULFA Value in next row Sensitive      SENSITIVE<=20    * LIGHT GROWTH ENTEROBACTER CLOACAE  C difficile quick scan w PCR reflex     Status: Abnormal   Collection Time: 11/15/15  7:36 AM  Result Value Ref Range Status   C Diff antigen POSITIVE (A) NEGATIVE Final   C Diff toxin  NEGATIVE NEGATIVE Final   C Diff interpretation   Final    Negative for toxigenic C. difficile. Toxin gene and active toxin production not detected. May be a nontoxigenic strain of C. difficile bacteria present, lacking the  ability to produce toxin.  Clostridium Difficile by PCR     Status: None   Collection Time: 11/15/15  7:36 AM  Result Value Ref Range Status   Toxigenic C Difficile by pcr NEGATIVE NEGATIVE Final    Studies/Results: No results found.  Assessment/Plan: [name] is a 55 y.o. female with hx of ESRD on dialysis, type 2 DM, hypertension, COPD, CHF and CAD admitted with n,v and AMS. She has multifocal PNA and large pleural effusion.  Prior sputum cx with Strep PNA and enterobacter cloacae, but only received initially 4 days treatment for the gram neg pna. This likely explains her leukocytosis. Her CT suggests chronic bronchiectasis and she is an aspiration risk.   Recommendations Cont levofloxacin to cover the strep PNA and enterobacter - rec  7 day course. Consider thoracentesis if worsens as she has large pleural effusion (likely due to HD and vol overload but would need to ro empyema if worsens. Thank you very much for the consult. Will follow with you.  [place], [place]   11/26/2015, 4:21 PM

## 2015-11-26 NOTE — Progress Notes (Signed)
[name] MRN: [id number] DOB: 03-03-1961   Cancelled Treatment:    Reason Eval/Treat Not Completed: Patient at procedure or test/unavailable (Pt currently off floor at dialysis.)  Will re-attempt PT at a later date/time.   [name] Derak Schurman 11/26/2015, 10:35 AM [name], [place]

## 2015-11-26 NOTE — Progress Notes (Signed)
FSBS 46/48; OJ given; following hypoglycemic protocol. Barbaraann Faster, RN 9:26 PM 11/26/2015

## 2015-11-26 NOTE — Progress Notes (Signed)
Pharmacy Antibiotic Note  [name] is a 55 y.o. female admitted on 11/12/2015 with pneumonia.  Pharmacy has been consulted for levofloxacin dosing. HD MWF.   Plan: Levofloxacin  500 mg IV q48h.   Height: 5\' 5"  (165.1 cm) Weight: 149 lb 14.6 oz (68 kg) IBW/kg (Calculated) : 57  Temp (24hrs), Avg:97.5 F (36.4 C), Min:97.3 F (36.3 C), Max:97.7 F (36.5 C)   Recent Labs Lab 11/20/15 0547 11/21/15 0445 11/22/15 0540 11/23/15 0501 11/26/15 0457  WBC  --  21.2* 15.8* 14.5* 5.9  CREATININE 1.79*  --  2.34* 3.11* 3.07*    Estimated Creatinine Clearance: 18.4 mL/min (by C-G formula based on Cr of 3.07).    Allergies  Allergen Reactions  . Compazine [Prochlorperazine Edisylate] Anaphylaxis and Nausea And Vomiting  . Ace Inhibitors Swelling  . Ativan [Lorazepam] Other (See Comments)    Reaction:  Hallucinations and headaches  . Codeine Nausea And Vomiting  . Gabapentin Other (See Comments)    Reaction:  Unknown   . Losartan Other (See Comments)    Reaction:  Unknown   . Ondansetron Other (See Comments)    Reaction:  Unknown   . Prochlorperazine Other (See Comments)    Reaction:  Unknown   . Scopolamine Other (See Comments)    Reaction:  Unknown   . Zofran [Ondansetron Hcl] Other (See Comments)    Reaction:  hallucinations   . Oxycodone Anxiety  . Tape Rash   Levaquin 3/2>>  Pharmacy will continue to monitor and adjust per consult.   Rocky Morel 11/26/2015 12:43 PM

## 2015-11-26 NOTE — Progress Notes (Signed)
Post hd tx 

## 2015-11-26 NOTE — Progress Notes (Signed)
Subjective:   Seen and examined on hemodialysis. Tolerating treatment well. UF goal of 1.5 litres. Last treatment was Saturday for extra ultrafiltration.    Objective:  Vital signs in last 24 hours:  Temp:  [97.3 F (36.3 C)-97.7 F (36.5 C)] 97.7 F (36.5 C) (03/06 1000) Pulse Rate:  [69-75] 69 (03/06 1100) Resp:  [12-22] 12 (03/06 1100) BP: (129-164)/(58-71) 164/71 mmHg (03/06 1100) SpO2:  [99 %-100 %] 100 % (03/06 1100) Weight:  [68 kg (149 lb 14.6 oz)] 68 kg (149 lb 14.6 oz) (03/06 1000)  Weight change:  Filed Weights   11/24/15 1400 11/24/15 1730 11/26/15 1000  Weight: 67.6 kg (149 lb 0.5 oz) 66.2 kg (145 lb 15.1 oz) 68 kg (149 lb 14.6 oz)    Intake/Output:    Intake/Output Summary (Last 24 hours) at 11/26/15 1222 Last data filed at 11/26/15 0740  Gross per 24 hour  Intake    100 ml  Output      0 ml  Net    100 ml     Physical Exam: General: No acute distress  HEENT Anicteric, dry oral mucus membranes  Neck supple  Pulm/lungs Clear , 4 L [place] O2  CV S1S2 no rubs  Abdomen:  Soft, Non tender, BS present  Extremities: Trace b/l LE edema  Neurologic: Awake, alert, follows commands  Skin: No acute rashes  Access: LUE AVF       Basic Metabolic Panel:   Recent Labs Lab 11/20/15 0547 11/22/15 0540 11/23/15 0501 11/26/15 0457  NA 140 140 139 137  K 3.3* 4.5 5.7* 4.2  CL 98* 100* 97* 97*  CO2 33* 34* 29 33*  GLUCOSE 179* 150* 84 62*  BUN 18 17 25* 13  CREATININE 1.79* 2.34* 3.11* 3.07*  CALCIUM 8.5* 8.3* 8.0* 8.1*  PHOS 1.6*  --   --   --      CBC:  Recent Labs Lab 11/21/15 0445 11/22/15 0540 11/23/15 0501 11/26/15 0457  WBC 21.2* 15.8* 14.5* 5.9  NEUTROABS  --   --   --  3.3  HGB 10.1* 9.8* 10.3* 9.1*  HCT 31.8* 30.1* 32.9* 28.7*  MCV 84.5 82.8 84.6 85.2  PLT 211 245 270 234      Microbiology:  Recent Results (from the past 720 hour(s))  MRSA PCR Screening     Status: None   Collection Time: 11/13/15  9:46 PM  Result Value Ref  Range Status   MRSA by PCR NEGATIVE NEGATIVE Final    Comment:        The GeneXpert MRSA Assay (FDA approved for NASAL specimens only), is one component of a comprehensive MRSA colonization surveillance program. It is not intended to diagnose MRSA infection nor to guide or monitor treatment for MRSA infections.   Culture, blood (Routine X 2) w Reflex to ID Panel     Status: None   Collection Time: 11/14/15 12:53 AM  Result Value Ref Range Status   Specimen Description BLOOD RIGHT HAND  Final   Special Requests BOTTLES DRAWN AEROBIC AND ANAEROBIC 5ML  Final   Culture NO GROWTH 6 DAYS  Final   Report Status 11/20/2015 FINAL  Final  Culture, blood (Routine X 2) w Reflex to ID Panel     Status: None   Collection Time: 11/14/15  7:14 AM  Result Value Ref Range Status   Specimen Description BLOOD RIGHT HAND  Final   Special Requests   Final    BOTTLES DRAWN AEROBIC AND ANAEROBIC AER  4ML ANA 2ML   Culture NO GROWTH 6 DAYS  Final   Report Status 11/20/2015 FINAL  Final  Urine culture     Status: None   Collection Time: 11/14/15 10:16 AM  Result Value Ref Range Status   Specimen Description URINE, RANDOM  Final   Special Requests NONE  Final   Culture NO GROWTH 1 DAY  Final   Report Status 11/15/2015 FINAL  Final  Culture, expectorated sputum-assessment     Status: None   Collection Time: 11/14/15  4:20 PM  Result Value Ref Range Status   Specimen Description SPU  Final   Special Requests NONE  Final   Sputum evaluation THIS SPECIMEN IS ACCEPTABLE FOR SPUTUM CULTURE  Final   Report Status 11/14/2015 FINAL  Final  Culture, respiratory (NON-Expectorated)     Status: None   Collection Time: 11/14/15  4:20 PM  Result Value Ref Range Status   Specimen Description SPU  Final   Special Requests NONE Reflexed from [id number]  Final   Gram Stain   Final    MANY WBC SEEN NO SQUAMOUS EPITHELIAL CELLS PRESENT FEW GRAM POSITIVE COCCI EXCELLENT SPECIMEN - 90-100% WBCS    Culture   Final     LIGHT GROWTH ENTEROBACTER CLOACAE MODERATE GROWTH STREPTOCOCCUS PNEUMONIAE    Report Status 11/18/2015 FINAL  Final   Organism ID, Bacteria ENTEROBACTER CLOACAE  Final   Organism ID, Bacteria STREPTOCOCCUS PNEUMONIAE  Final      Susceptibility   Streptococcus pneumoniae - MIC*    ERYTHROMYCIN <=0.12 SENSITIVE Sensitive     LEVOFLOXACIN 1 SENSITIVE Sensitive     VANCOMYCIN 0.5 SENSITIVE Sensitive     PENICILLIN <=0.06 SENSITIVE Sensitive     CEFTRIAXONE <=0.12 SENSITIVE Sensitive     * MODERATE GROWTH STREPTOCOCCUS PNEUMONIAE   Enterobacter cloacae - MIC*    PIP/TAZO Value in next row Sensitive      SENSITIVE8    CEFAZOLIN Value in next row Resistant      RESISTANT>=64    CEFEPIME Value in next row Sensitive      SENSITIVE<=1    CEFTAZIDIME Value in next row Sensitive      SENSITIVE<=1    CEFTRIAXONE Value in next row Sensitive      SENSITIVE<=1    CIPROFLOXACIN Value in next row Sensitive      SENSITIVE<=0.25    GENTAMICIN Value in next row Sensitive      SENSITIVE<=1    IMIPENEM Value in next row Sensitive      SENSITIVE1    TRIMETH/SULFA Value in next row Sensitive      SENSITIVE<=20    * LIGHT GROWTH ENTEROBACTER CLOACAE  C difficile quick scan w PCR reflex     Status: Abnormal   Collection Time: 11/15/15  7:36 AM  Result Value Ref Range Status   C Diff antigen POSITIVE (A) NEGATIVE Final   C Diff toxin NEGATIVE NEGATIVE Final   C Diff interpretation   Final    Negative for toxigenic C. difficile. Toxin gene and active toxin production not detected. May be a nontoxigenic strain of C. difficile bacteria present, lacking the ability to produce toxin.  Clostridium Difficile by PCR     Status: None   Collection Time: 11/15/15  7:36 AM  Result Value Ref Range Status   Toxigenic C Difficile by pcr NEGATIVE NEGATIVE Final    Coagulation Studies: No results for input(s): LABPROT, INR in the last 72 hours.  Urinalysis: No results for input(s): COLORURINE, LABSPEC,  PHURINE, GLUCOSEU, HGBUR, BILIRUBINUR, KETONESUR, PROTEINUR, UROBILINOGEN, NITRITE, LEUKOCYTESUR in the last 72 hours.  Invalid input(s): APPERANCEUR    Imaging: No results found.   Medications:     . acidophilus  2 capsule Oral TID  . amiodarone  200 mg Oral Daily  . atorvastatin  20 mg Oral QPM  . bacitracin   Topical BID  . chlorhexidine gluconate  15 mL Mouth Rinse BID  . cholecalciferol  400 Units Oral Daily  . citalopram  20 mg Oral Daily  . clopidogrel  75 mg Oral Daily  . guaiFENesin  600 mg Oral BID  . insulin aspart  0-5 Units Subcutaneous QHS  . insulin aspart  0-9 Units Subcutaneous TID WC  . levofloxacin (LEVAQUIN) IV  500 mg Intravenous Q48H  . lipase/protease/amylase  12,000 Units Oral TID WC  . pantoprazole  40 mg Oral Q1200  . potassium chloride  40 mEq Oral Once  . vitamin B-12  1,000 mcg Oral Daily   acetaminophen **OR** acetaminophen, albuterol, alum & mag hydroxide-simeth, bisacodyl, dextrose, HYDROcodone-acetaminophen, iohexol, nitroGLYCERIN, senna-docusate  Assessment/ Plan:  55 y.o. 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   [facility] MWF [place]  1. End Stage Renal Disease: MWF. Seen and examined on hemodialysis. Extra treatment of dialysis done on Saturday for volume removal, ultrafiltration of 2.5 litres Ultrafiltration goal of 2.5 litres  - discontinued potassium. Patient with history of hyperkalemia on this admission.   2. Anemia of chronic kidney disease: hemoglobin 9.1 -  holding off on epogen given right renal masses x 2.  3.  Acute resp failure: now 4 litres [place]. Secondary to combination of Pneumonia, Anasarca, large pleural effusions   4.  Secondary Hyperparathyroidism: PTH low at 111. Phosphorus at low.   5. Gastroparesis,  Nausea and vomiting - symptomatic management  6.  Renal mass x 2.  Two small enhancing masses in upper pole of right kidney, suspicious for small renal cell carcinomas seen on abdominal CT with IV contrast on February 20 Patient will need to f/u with urology.        LOSJuleen China, Lurena Nida 3/6/201712:22 PM

## 2015-11-26 NOTE — Progress Notes (Signed)
Pt blood sugar reported as 48 rechecked pt blood sugar was 53 gave thickened juice and apple sauce blood sugar raised to 55. Gave pt amp of D5 blood sugar resulted in 140 Continue to assess. Dr. Bridgett Larsson notified.

## 2015-11-26 NOTE — Progress Notes (Signed)
HD tx start 

## 2015-11-26 NOTE — Progress Notes (Addendum)
[name] NAME: [name]    MR#:  [id number]  DATE OF BIRTH:  [birth year]  SUBJECTIVE:   Low BS for the past 2 days. BS was 48 this am, possible due to poor appetite. On 2 L oxygen. She was on HD when I saw her.  REVIEW OF SYSTEMS:   Constitutional: no fever or chills, but has poor appetite and oral intake, generalized weakness. HENT: Negative for sore throat.  Eyes: Negative for blurred vision, double vision and pain.  Respiratory: Negative for cough, hemoptysis, shortness of breath and wheezing.  Cardiovascular: Negative for chest pain, palpitations, orthopnea and leg swelling.  Gastrointestinal: Negative for heartburn, no abdominal pain, nausea, vomiting, diarrhea or constipation.  Genitourinary: Negative for dysuria and hematuria.  Musculoskeletal: Negative for back pain and joint pain.  Skin: Negative for rash.  Neurological: Negative for sensory change, speech change, focal weakness and headaches.    DRUG ALLERGIES:   Allergies  Allergen Reactions  . Compazine [Prochlorperazine Edisylate] Anaphylaxis and Nausea And Vomiting  . Ace Inhibitors Swelling  . Ativan [Lorazepam] Other (See Comments)    Reaction:  Hallucinations and headaches  . Codeine Nausea And Vomiting  . Gabapentin Other (See Comments)    Reaction:  Unknown   . Losartan Other (See Comments)    Reaction:  Unknown   . Ondansetron Other (See Comments)    Reaction:  Unknown   . Prochlorperazine Other (See Comments)    Reaction:  Unknown   . Scopolamine Other (See Comments)    Reaction:  Unknown   . Zofran [Ondansetron Hcl] Other (See Comments)    Reaction:  hallucinations   . Oxycodone Anxiety  . Tape Rash    VITALS:  Blood pressure 159/63, pulse 71, temperature 97.8 F (36.6 C), temperature source Oral, resp. rate 16, height 5\' 5"  (1.651 m), weight 66.5 kg (146 lb 9.7 oz), SpO2 100 %.  PHYSICAL EXAMINATION:   Physical Exam   Constitutional: She is well-developed, well-nourished, and in no distress. No distress.  HENT:  Head: Normocephalic.  Nose: Nose normal.  Decreased hearing  Eyes: No scleral icterus.  Neck: No JVD present. No tracheal deviation present.  Cardiovascular: Normal rate, regular rhythm and normal heart sounds.  Exam reveals no gallop and no friction rub.   No murmur heard. Chest wall tenderness  Pulmonary/Chest: Effort normal. No respiratory distress. She has decreased breath sounds in the right lower field and the left lower field. She has no wheezes. She has no rales. She exhibits no tenderness.  Abdominal: Soft. Bowel sounds are normal. She exhibits no distension and no mass. There is no tenderness. There is no rebound and no guarding.  Musculoskeletal: Normal range of motion. She exhibits no edema.  Skin: Skin is warm. No rash noted. No erythema.    LABORATORY PANEL:   CBC  Recent Labs Lab 11/26/15 0457  WBC 5.9  HGB 9.1*  HCT 28.7*  PLT 234   ------------------------------------------------------------------------------------------------------------------  Chemistries   Recent Labs Lab 11/26/15 0457  NA 137  K 4.2  CL 97*  CO2 33*  GLUCOSE 62*  BUN 13  CREATININE 3.07*  CALCIUM 8.1*   ------------------------------------------------------------------------------------------------------------------  Cardiac Enzymes No results for input(s): TROPONINI in the last 168 hours. ------------------------------------------------------------------------------------------------------------------  RADIOLOGY:  No results found.   ASSESSMENT AND PLAN:   55 year old female with end-stage renal disease on hemodialysis, diabetes with retinopathy, gastroparesis and neuropathy who initially presented with abdominal pain and subsequently  has acute respiratory failure secondary to pneumonia.  # Bilateral pneumonia with Streptococcus and enterobacter On levaquin. Will need this  till 11/30/2015. Appreciate ID help.  # Acute respiratory failure due to pneumonia and pulmonary edema with bilateral large pleural effusions Patient was intubated and on ventilation, is status post extubation. CT scan shows no acute pulmonary emboli however did show large bilateral pleural effusions and multifocal pneumonia.  # Acute on chronic diastolic heart failure in the setting of ESRD - Improved Repeated CXR showed CHF. Continue dialysis.  # Septic shock: Due to C. difficile and pneumonia Resolved  # Diabetic Gastroparesis Continue Phenergan and Reglan  # ESRD on hemodialysis: Continue dialysis Monday, Wednesday and Friday.  # Renal mass -Suggestive of early RCC. Urology consult requested and will need outpatient follow-up with urology  #Type 2 diabetes with gastroparesis: on Sliding scale insulin.  * Hypoglycemia. Due to poor oral intake, encourage oral intake. Not on basal insulin.  #Dysphagia:  as per speech she has signs and symptoms of chronic aspiration   Dysphagia 2 (fine chop);Nectar-thick liquid with aspiration precautions  Management plans discussed with the patient and she is in agreement. Greater than 50% time was spent on coordination of care and face-to-face counseling. CODE STATUS: FULL  TOTAL TIME TAKING CARE OF THIS PATIENT: 35 minutes.   Likely discharge to skilled nursing facility  tomorrow.  Demetrios Loll M.D on 11/26/2015 at 3:05 PM  Between 7am to 6pm - Pager - [phone]  After 6pm go to [email or web address] - password [email or web address] [facility]  Office  [phone]  CC: Primary care physician; Ellamae Sia, MD  Note: This dictation was prepared with Dragon dictation along with smaller phrase technology. Any transcriptional errors that result from this process are unintentional.

## 2015-11-27 LAB — GLUCOSE, CAPILLARY
Glucose-Capillary: 114 mg/dL — ABNORMAL HIGH (ref 65–99)
Glucose-Capillary: 138 mg/dL — ABNORMAL HIGH (ref 65–99)
Glucose-Capillary: 151 mg/dL — ABNORMAL HIGH (ref 65–99)
Glucose-Capillary: 44 mg/dL — CL (ref 65–99)
Glucose-Capillary: 47 mg/dL — ABNORMAL LOW (ref 65–99)
Glucose-Capillary: 48 mg/dL — ABNORMAL LOW (ref 65–99)
Glucose-Capillary: 50 mg/dL — ABNORMAL LOW (ref 65–99)
Glucose-Capillary: 59 mg/dL — ABNORMAL LOW (ref 65–99)
Glucose-Capillary: 77 mg/dL (ref 65–99)

## 2015-11-27 IMAGING — CT CT ABD-PELV W/O CM
1 of 2 series · 15 of 32 positions shown, 19 images · non-contrast
Comparison: CT 05/07/2015 acute

CLINICAL DATA: RIGHT upper quadrant pain. Rebound tenderness. Pain
for 24 hours.

EXAM:
CT ABDOMEN AND PELVIS WITHOUT CONTRAST
TECHNIQUE: Multidetector CT imaging of the abdomen and pelvis was performed
following the standard protocol without IV contrast.

[Series 2: routine abd pel without · axial · non-contrast · 0.78mm/px · z∈[-660,-254]mm · 15 of 89 slices shown, 19 images]
[im 4/89  soft-tissue]
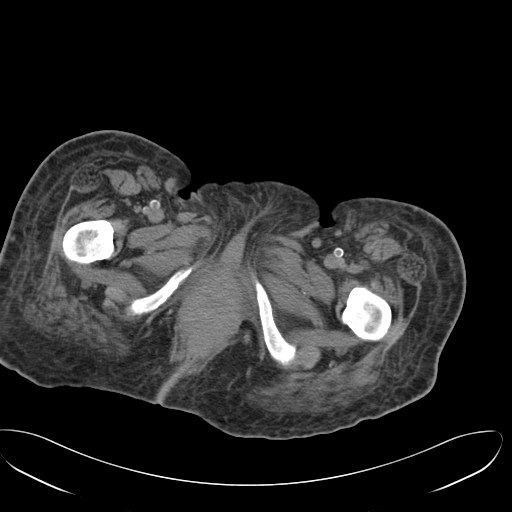
[im 4/89  bone]
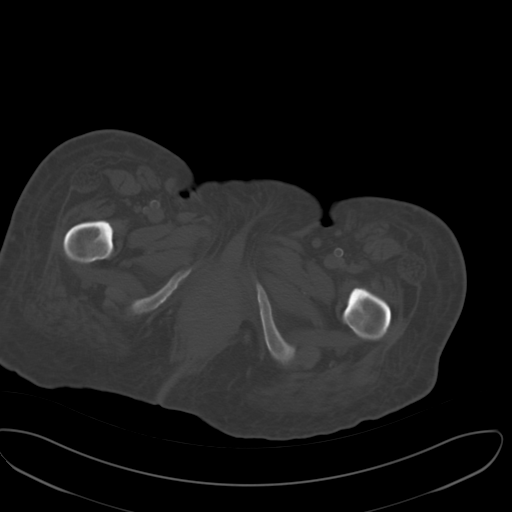
[im 12/89  soft-tissue]
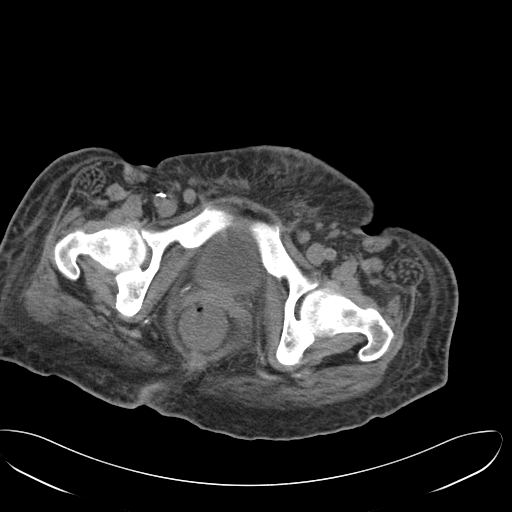
[im 20/89  soft-tissue]
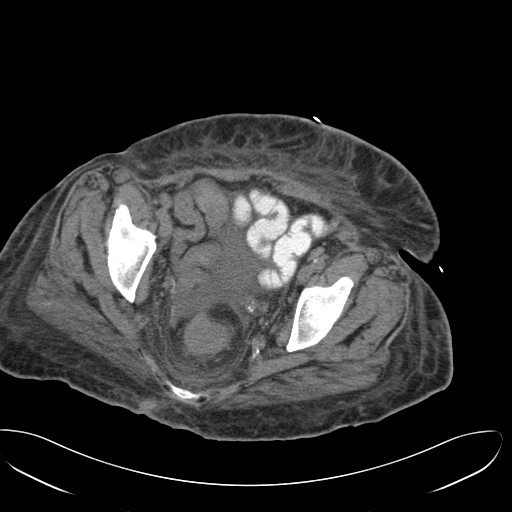
[im 23/89  soft-tissue]
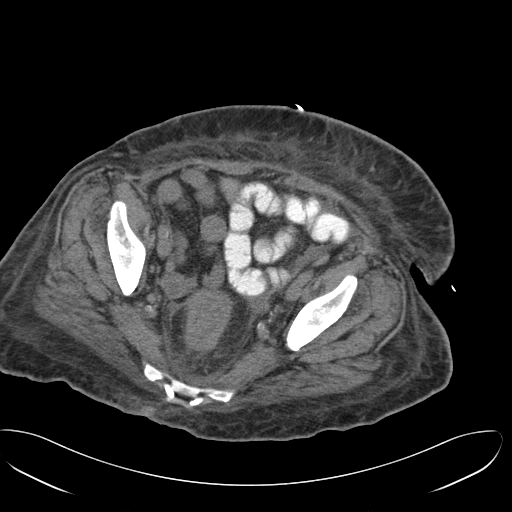
[im 31/89  soft-tissue]
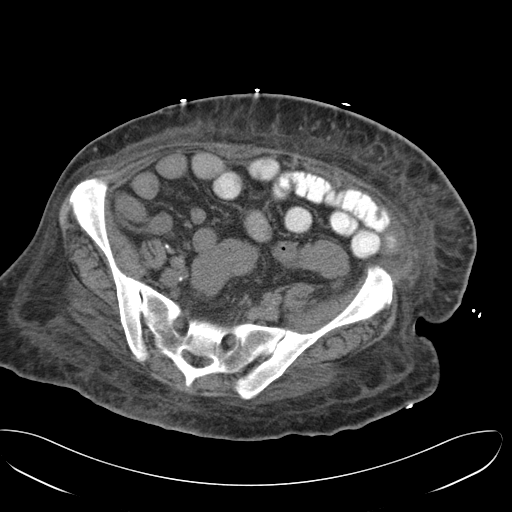
[im 39/89  soft-tissue]
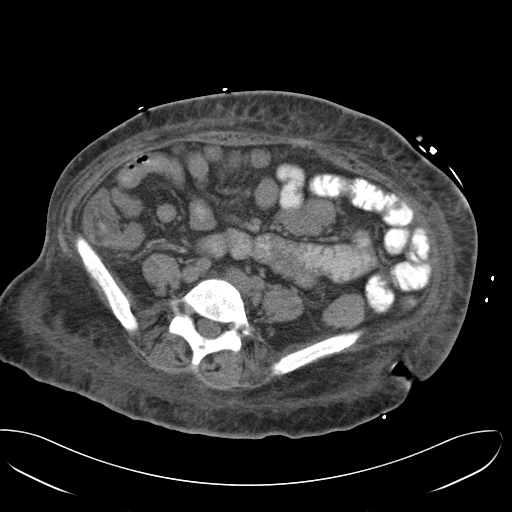
[im 46/89  soft-tissue]
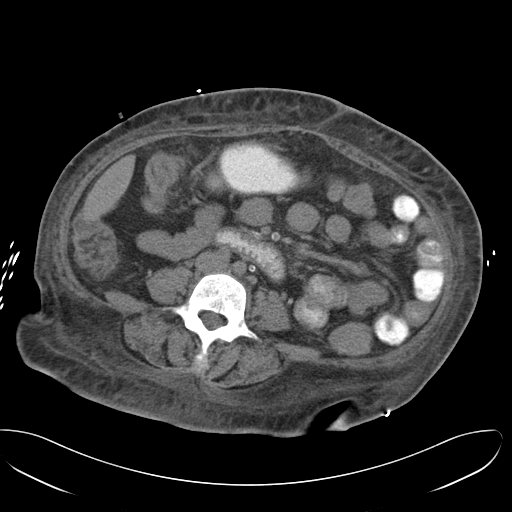
[im 50/89  soft-tissue]
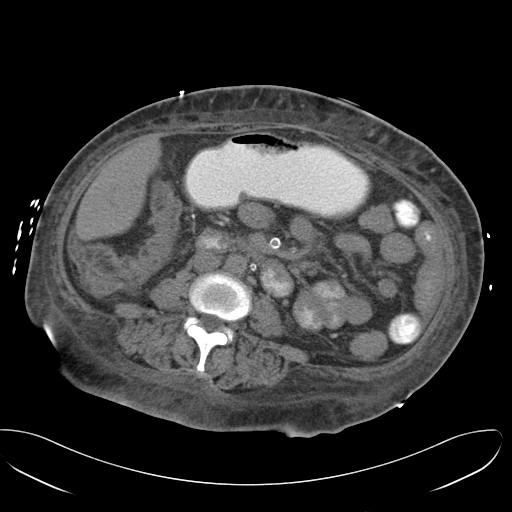
[im 58/89  soft-tissue]
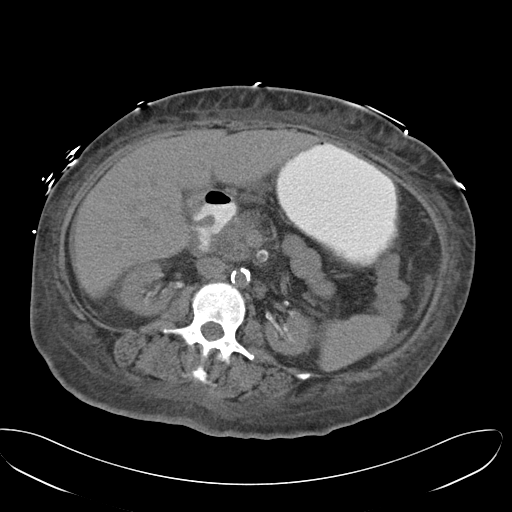
[im 58/89  bone]
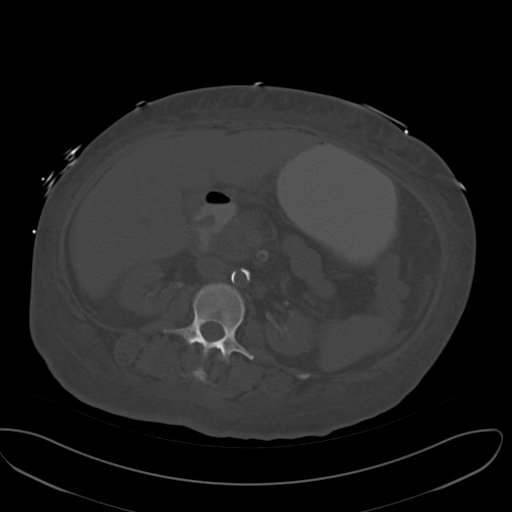
[im 66/89  soft-tissue]
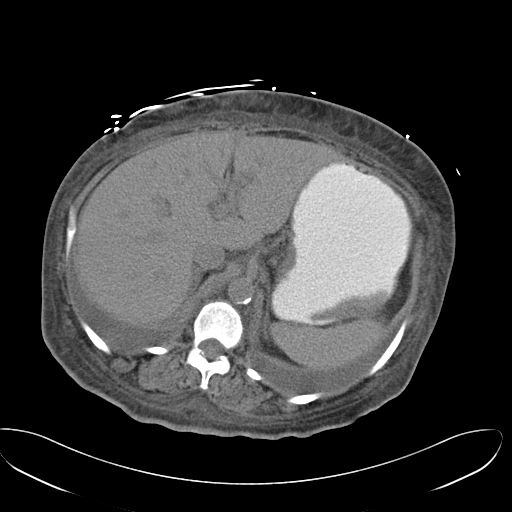
[im 69/89  soft-tissue]
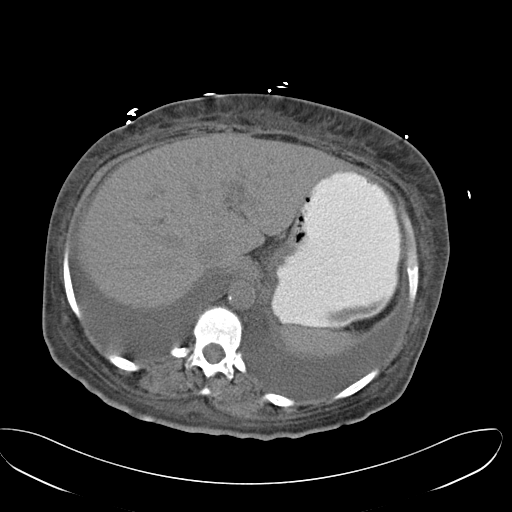
[im 73/89  lung]
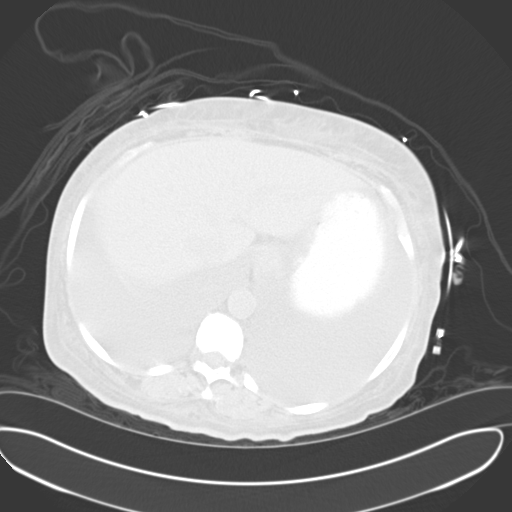
[im 77/89  soft-tissue]
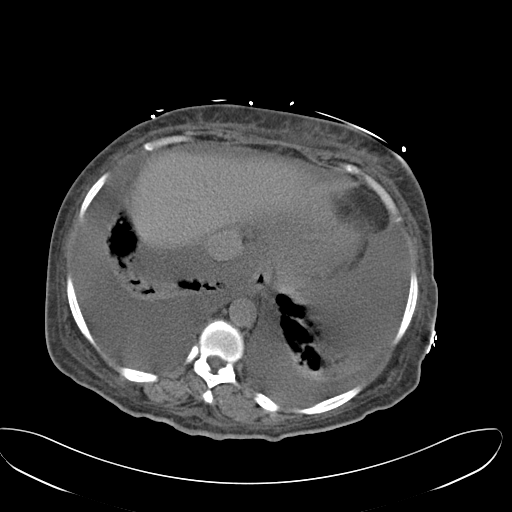
[im 77/89  lung]
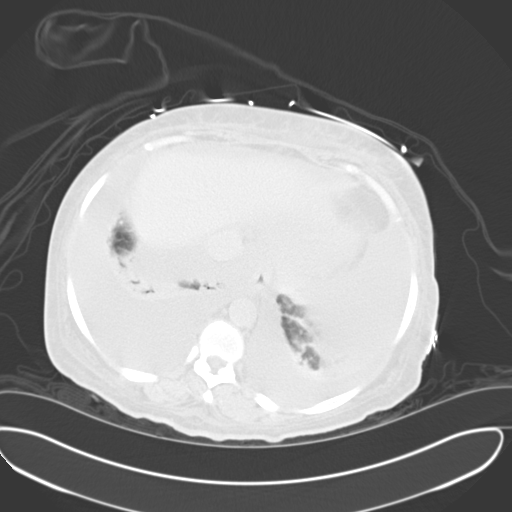
[im 81/89  lung]
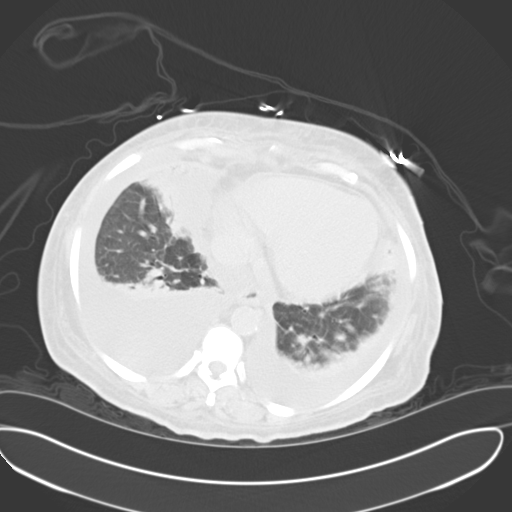
[im 85/89  soft-tissue]
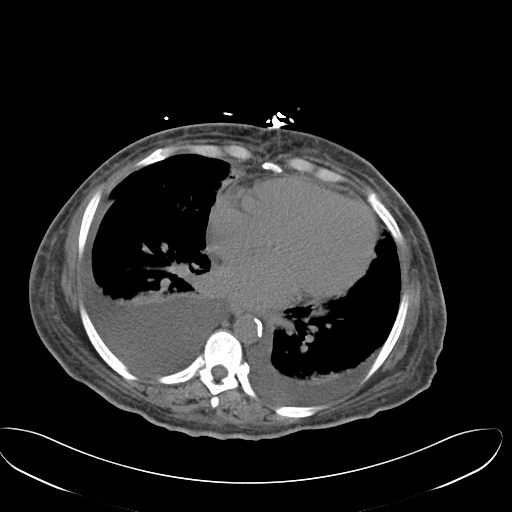
[im 85/89  lung]
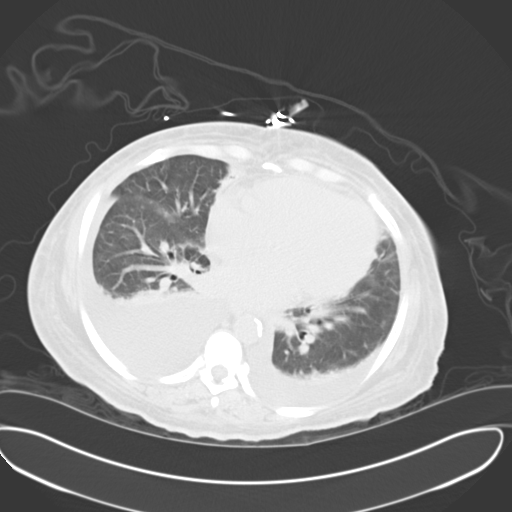

[15 of 32 positions shown; findings below may reference images not displayed]

FINDINGS: Lower chest: Moderate RIGHT pleural effusion is similar to
comparison exam. Small LEFT effusion is similar. Bibasilar
atelectasis not changed. Passive atelectasis the lung bases.

Hepatobiliary: No focal hepatic lesions noncontrast exam. Post
cholecystectomy.

Pancreas: Pancreas is normal. No ductal dilatation. No pancreatic
inflammation.

Spleen: Normal spleen

Adrenals/urinary tract: Adrenal glands and kidneys are normal. The
ureters and bladder normal.

Stomach/Bowel: The stomach is distended with oral contrast. There is
thickening through the pylorus. The duodenum is normal. The proximal
small bowel is normal caliber with normal mucosal pattern. The
distal small bowel is not opacified with the oral contrast but there
is no caliber change suggesting delay in oral transit without
obstruction. Terminal ileum is normal. Appendix not identified. The
ascending, transverse, and descending colon are collapsed. There is
stool in the rectosigmoid colon.

Vascular/Lymphatic: Abdominal aorta is normal caliber with
atherosclerotic calcification. There is no retroperitoneal or
periportal lymphadenopathy. No pelvic lymphadenopathy.

Reproductive: Post hysterectomy.

Musculoskeletal: No aggressive osseous lesion.

Other: Mild anasarca of the soft tissues. No significant free fluid
the abdomen pelvis. No intraperitoneal free air.
IMPRESSION: 1. No evidence of bowel obstruction.  No intraperitoneal free air.
2. Degenerate of oral contrast within the stomach and proximal small
bowel. This is likely a timing issue. Cannot exclude gastric paresis
outlet obstruction although no obstructing lesion identified.
Thickening through the pylorus is felt to be physiologic.
3. Bilateral pleural effusions which are moderate to large on the
RIGHT are not changed from prior.
4. Anasarca of the soft tissues.

## 2015-11-27 MED ORDER — DEXTROSE 5 % IV SOLN
INTRAVENOUS | Status: AC
Start: 2015-11-27 — End: 2015-11-28
  Administered 2015-11-27: 18:00:00 via INTRAVENOUS

## 2015-11-27 NOTE — Progress Notes (Addendum)
[facility] at [place] NAME: [name]    MR#:  [id number]  DATE OF BIRTH:  12/05/1960  SUBJECTIVE:   Low BS for the past 3 days. BS was 47 this am, possible due to poor appetite. On 2 L oxygen. She was on HD when I saw her.  REVIEW OF SYSTEMS:   Constitutional: no fever or chills, but has poor appetite and oral intake, generalized weakness. HENT: Negative for sore throat.  Eyes: Negative for blurred vision, double vision and pain.  Respiratory: Negative for cough, hemoptysis, shortness of breath and wheezing.  Cardiovascular: Negative for chest pain, palpitations, orthopnea and leg swelling.  Gastrointestinal: Negative for heartburn, no abdominal pain, nausea, vomiting, diarrhea or constipation.  Genitourinary: Negative for dysuria and hematuria.  Musculoskeletal: Negative for back pain and joint pain.  Skin: Negative for rash.  Neurological: Negative for sensory change, speech change, focal weakness and headaches.    DRUG ALLERGIES:   Allergies  Allergen Reactions  . Compazine [Prochlorperazine Edisylate] Anaphylaxis and Nausea And Vomiting  . Ace Inhibitors Swelling  . Ativan [Lorazepam] Other (See Comments)    Reaction:  Hallucinations and headaches  . Codeine Nausea And Vomiting  . Gabapentin Other (See Comments)    Reaction:  Unknown   . Losartan Other (See Comments)    Reaction:  Unknown   . Ondansetron Other (See Comments)    Reaction:  Unknown   . Prochlorperazine Other (See Comments)    Reaction:  Unknown   . Scopolamine Other (See Comments)    Reaction:  Unknown   . Zofran [Ondansetron Hcl] Other (See Comments)    Reaction:  hallucinations   . Oxycodone Anxiety  . Tape Rash    VITALS:  Blood pressure 158/50, pulse 67, temperature 97.6 F (36.4 C), temperature source Oral, resp. rate 16, height 5\' 5"  (1.651 m), weight 66.5 kg (146 lb 9.7 oz), SpO2 100 %.  PHYSICAL EXAMINATION:   Physical Exam   Constitutional: She is well-developed, well-nourished, and in no distress. No distress.  HENT:  Head: Normocephalic.  Nose: Nose normal.  Decreased hearing  Eyes: No scleral icterus.  Neck: No JVD present. No tracheal deviation present.  Cardiovascular: Normal rate, regular rhythm and normal heart sounds.  Exam reveals no gallop and no friction rub.   No murmur heard. Chest wall tenderness  Pulmonary/Chest: Effort normal. No respiratory distress. She has decreased breath sounds in the right lower field and the left lower field. She has no wheezes. She has no rales. She exhibits no tenderness.  Abdominal: Soft. Bowel sounds are normal. She exhibits no distension and no mass. There is no tenderness. There is no rebound and no guarding.  Musculoskeletal: Normal range of motion. She exhibits no edema.  Skin: Skin is warm. No rash noted. No erythema.    LABORATORY PANEL:   CBC  Recent Labs Lab 11/26/15 0457  WBC 5.9  HGB 9.1*  HCT 28.7*  PLT 234   ------------------------------------------------------------------------------------------------------------------  Chemistries   Recent Labs Lab 11/26/15 0457  NA 137  K 4.2  CL 97*  CO2 33*  GLUCOSE 62*  BUN 13  CREATININE 3.07*  CALCIUM 8.1*   ------------------------------------------------------------------------------------------------------------------  Cardiac Enzymes No results for input(s): TROPONINI in the last 168 hours. ------------------------------------------------------------------------------------------------------------------  RADIOLOGY:  No results found.   ASSESSMENT AND PLAN:   55 year old female with end-stage renal disease on hemodialysis, diabetes with retinopathy, gastroparesis and neuropathy who initially presented with abdominal pain and subsequently  has acute respiratory failure secondary to pneumonia.  # Bilateral pneumonia with Streptococcus and enterobacter On levaquin. Will need this  till 11/29/2015. Appreciate ID help.  # Acute respiratory failure due to pneumonia and pulmonary edema with bilateral large pleural effusions Patient was intubated and on ventilation, is status post extubation. CT scan shows no acute pulmonary emboli however did show large bilateral pleural effusions and multifocal pneumonia. Repeat CXR tomorrow. Thoracentesis if still large bilateral pleural effusions.  # Acute on chronic diastolic heart failure in the setting of ESRD - Improved Repeated CXR showed CHF. Continue dialysis.  # Septic shock: Due to C. difficile and pneumonia Resolved  # Diabetic Gastroparesis Continue Phenergan and Reglan  # ESRD on hemodialysis: Continue dialysis Monday, Wednesday and Friday.  # Renal mass -Suggestive of early RCC. Urology consult requested and will need outpatient follow-up with urology  #Type 2 diabetes with gastroparesis: on Sliding scale insulin.  * Hypoglycemia. Due to poor oral intake, encourage oral intake. Not on basal insulin. Dietitian consult.  Start D5 IV, NPO after midnight for thoracentesis.  #Dysphagia:  as per speech she has signs and symptoms of chronic aspiration   Dysphagia 2 (fine chop);Nectar-thick liquid with aspiration precautions   Management plans discussed with the patient and she is in agreement. Greater than 50% time was spent on coordination of care and face-to-face counseling. CODE STATUS: FULL  TOTAL TIME TAKING CARE OF THIS PATIENT: 35 minutes.   Likely discharge to skilled nursing facility  tomorrow.  Demetrios Loll M.D on 11/27/2015 at 3:49 PM  Between 7am to 6pm - Pager - [phone]  After 6pm go to [email or web address] - password [email or web address] [facility]  Office  [phone]  CC: Primary care physician; Ellamae Sia, MD  Note: This dictation was prepared with Dragon dictation along with smaller phrase technology. Any transcriptional errors that result from this process are  unintentional.

## 2015-11-27 NOTE — Progress Notes (Signed)
Inpatient Diabetes Program Recommendations  AACE/ADA: New Consensus Statement on Inpatient Glycemic Control (2015)  Target Ranges:  Prepandial:   less than 140 mg/dL      Peak postprandial:   less than 180 mg/dL (1-2 hours)      Critically ill patients:  140 - 180 mg/dL   Review of Glycemic Control Pt with persistent HYPOglycemia  Inpatient Diabetes Program Recommendations:  Correction (SSI): Discontinue Insulin Thank you  Raoul Pitch BSN, RN,CDE Inpatient Diabetes Coordinator [phone] (team pager)

## 2015-11-27 NOTE — Progress Notes (Signed)
[facility] INFECTIOUS DISE[facility] PROGRESS NOTE Date of Admission:  11/12/2015     ID: [name] is a 55 y.o. female with PNA Active Problems:   Pressure ulcer   Nausea   High temperature   Pulmonary edema   Encounter for central line placement   Encounter for orogastric (OG) tube placement   Acute renal failure ([place])   Respiratory failure (HCC)   Subjective: Not eating much, sugars low at times. No fevers. Wbc good  ROS  Eleven systems are reviewed and negative except per hpi  Medications:  Antibiotics Given (last 72 hours)    Date/Time Action Medication Dose Rate   11/24/15 1826 Given   levofloxacin (LEVAQUIN) IVPB 500 mg 500 mg 100 mL/hr   11/26/15 1725 Given   levofloxacin (LEVAQUIN) IVPB 500 mg 500 mg 100 mL/hr     . acidophilus  2 capsule Oral TID  . amiodarone  200 mg Oral Daily  . atorvastatin  20 mg Oral QPM  . bacitracin   Topical BID  . chlorhexidine gluconate  15 mL Mouth Rinse BID  . cholecalciferol  400 Units Oral Daily  . citalopram  20 mg Oral Daily  . clopidogrel  75 mg Oral Daily  . guaiFENesin  600 mg Oral BID  . insulin aspart  0-5 Units Subcutaneous QHS  . insulin aspart  0-9 Units Subcutaneous TID WC  . levofloxacin (LEVAQUIN) IV  500 mg Intravenous Q48H  . lipase/protease/amylase  12,000 Units Oral TID WC  . pantoprazole  40 mg Oral Q1200  . vitamin B-12  1,000 mcg Oral Daily    Objective: Vital signs in last 24 hours: Temp:  [97.6 F (36.4 C)-98 F (36.7 C)] 97.6 F (36.4 C) (03/07 1206) Pulse Rate:  [67-73] 67 (03/07 1206) Resp:  [16-18] 16 (03/07 1206) BP: (139-158)/(50-53) 158/50 mmHg (03/07 1206) SpO2:  [100 %] 100 % (03/07 1206) Constitutional: slowed mentation, nad.  HENT: [place]/AT, PERRLA, no scleral icterus Mouth/Throat: Oropharynx - thick mucus Cardiovascular: RRR Pulmonary/Chest: Bil rhonchi and decreased BS Neck = supple, no nuchal rigidity Abdominal: Soft. Bowel sounds are normal. exhibits no distension. There is no  tenderness.  Lymphadenopathy: no cervical adenopathy. No axillary adenopathy Neurological: slowed mentation  Skin: Skin is warm and dry. No rash noted. No erythema.  Psychiatric: flat affect  Lab Results  Recent Labs  11/26/15 0457  WBC 5.9  HGB 9.1*  HCT 28.7*  NA 137  K 4.2  CL 97*  CO2 33*  BUN 13  CREATININE 3.07*    Microbiology: Results for orders placed or performed during the hospital encounter of 11/12/15  MRSA PCR Screening     Status: None   Collection Time: 11/13/15  9:46 PM  Result Value Ref Range Status   MRSA by PCR NEGATIVE NEGATIVE Final    Comment:        The GeneXpert MRSA Assay (FDA approved for NASAL specimens only), is one component of a comprehensive MRSA colonization surveillance program. It is not intended to diagnose MRSA infection nor to guide or monitor treatment for MRSA infections.   Culture, blood (Routine X 2) w Reflex to ID Panel     Status: None   Collection Time: 11/14/15 12:53 AM  Result Value Ref Range Status   Specimen Description BLOOD RIGHT HAND  Final   Special Requests BOTTLES DRAWN AEROBIC AND ANAEROBIC 5ML  Final   Culture NO GROWTH 6 DAYS  Final   Report Status 11/20/2015 FINAL  Final  Culture,  blood (Routine X 2) w Reflex to ID Panel     Status: None   Collection Time: 11/14/15  7:14 AM  Result Value Ref Range Status   Specimen Description BLOOD RIGHT HAND  Final   Special Requests   Final    BOTTLES DRAWN AEROBIC AND ANAEROBIC AER 4ML ANA 2ML   Culture NO GROWTH 6 DAYS  Final   Report Status 11/20/2015 FINAL  Final  Urine culture     Status: None   Collection Time: 11/14/15 10:16 AM  Result Value Ref Range Status   Specimen Description URINE, RANDOM  Final   Special Requests NONE  Final   Culture NO GROWTH 1 DAY  Final   Report Status 11/15/2015 FINAL  Final  Culture, expectorated sputum-assessment     Status: None   Collection Time: 11/14/15  4:20 PM  Result Value Ref Range Status   Specimen Description  SPU  Final   Special Requests NONE  Final   Sputum evaluation THIS SPECIMEN IS ACCEPTABLE FOR SPUTUM CULTURE  Final   Report Status 11/14/2015 FINAL  Final  Culture, respiratory (NON-Expectorated)     Status: None   Collection Time: 11/14/15  4:20 PM  Result Value Ref Range Status   Specimen Description SPU  Final   Special Requests NONE Reflexed from [place]:[id number]  Final   Gram Stain   Final    MANY WBC SEEN NO SQUAMOUS EPITHELIAL CELLS PRESENT FEW GRAM POSITIVE COCCI EXCELLENT SPECIMEN - 90-100% WBCS    Culture   Final    LIGHT GROWTH ENTEROBACTER CLOACAE MODERATE GROWTH STREPTOCOCCUS PNEUMONIAE    Report Status 11/18/2015 FINAL  Final   Organism ID, Bacteria ENTEROBACTER CLOACAE  Final   Organism ID, Bacteria STREPTOCOCCUS PNEUMONIAE  Final      Susceptibility   Streptococcus pneumoniae - MIC*    ERYTHROMYCIN <=0.12 SENSITIVE Sensitive     LEVOFLOXACIN 1 SENSITIVE Sensitive     VANCOMYCIN 0.5 SENSITIVE Sensitive     PENICILLIN <=0.06 SENSITIVE Sensitive     CEFTRIAXONE <=0.12 SENSITIVE Sensitive     * MODERATE GROWTH STREPTOCOCCUS PNEUMONIAE   Enterobacter cloacae - MIC*    PIP/TAZO Value in next row Sensitive      SENSITIVE8    CEFAZOLIN Value in next row Resistant      RESISTANT>=64    CEFEPIME Value in next row Sensitive      SENSITIVE<=1    CEFTAZIDIME Value in next row Sensitive      SENSITIVE<=1    CEFTRIAXONE Value in next row Sensitive      SENSITIVE<=1    CIPROFLOXACIN Value in next row Sensitive      SENSITIVE<=0.25    GENTAMICIN Value in next row Sensitive      SENSITIVE<=1    IMIPENEM Value in next row Sensitive      SENSITIVE1    TRIMETH/SULFA Value in next row Sensitive      SENSITIVE<=20    * LIGHT GROWTH ENTEROBACTER CLOACAE  C difficile quick scan w PCR reflex     Status: Abnormal   Collection Time: 11/15/15  7:36 AM  Result Value Ref Range Status   C Diff antigen POSITIVE (A) NEGATIVE Final   C Diff toxin NEGATIVE NEGATIVE Final   C Diff  interpretation   Final    Negative for toxigenic C. difficile. Toxin gene and active toxin production not detected. May be a nontoxigenic strain of C. difficile bacteria present, lacking the ability to produce toxin.  Clostridium Difficile by  PCR     Status: None   Collection Time: 11/15/15  7:36 AM  Result Value Ref Range Status   Toxigenic C Difficile by pcr NEGATIVE NEGATIVE Final    Studies/Results: No results found.  Assessment/Plan: [name] is a 55 y.o. female with hx of ESRD on dialysis, type 2 DM, hypertension, COPD, CHF and CAD admitted with n,v and AMS. She has multifocal PNA and large pleural effusion.  Prior sputum cx with Strep PNA and enterobacter cloacae, but only received initially 4 days treatment for the gram neg pna. This likely explains her leukocytosis. Her CT suggests chronic bronchiectasis and she is an aspiration risk.   Recommendations Cont levofloxacin to cover the strep PNA and enterobacter - rec  7 day course.  Stop date 3/9. Consider thoracentesis if worsens as she has large pleural effusion (likely due to HD and vol overload but would need to ro empyema if she clinically worsens) Thank you very much for the consult.  Will sign off now.   [place], [place]   11/27/2015, 3:05 PM

## 2015-11-27 NOTE — Progress Notes (Signed)
Subjective:   Hemodialysis treatment yesterday. Tolerated treatment well. UF of 1.5 litres On levofloxacin.   Objective:  Vital signs in last 24 hours:  Temp:  [97.8 F (36.6 C)-98 F (36.7 C)] 98 F (36.7 C) (03/07 0749) Pulse Rate:  [68-73] 73 (03/07 0749) Resp:  [11-20] 18 (03/07 0749) BP: (139-164)/(51-63) 153/51 mmHg (03/07 0749) SpO2:  [100 %] 100 % (03/07 0749) Weight:  [66.5 kg (146 lb 9.7 oz)] 66.5 kg (146 lb 9.7 oz) (03/06 1330)  Weight change:  Filed Weights   11/24/15 1730 11/26/15 1000 11/26/15 1330  Weight: 66.2 kg (145 lb 15.1 oz) 68 kg (149 lb 14.6 oz) 66.5 kg (146 lb 9.7 oz)    Intake/Output:    Intake/Output Summary (Last 24 hours) at 11/27/15 1200 Last data filed at 11/27/15 0900  Gross per 24 hour  Intake     20 ml  Output   1500 ml  Net  -1480 ml     Physical Exam: General: No acute distress  HEENT Anicteric, dry oral mucus membranes  Neck supple  Pulm/lungs Clear , 4 L [place] O2  CV S1S2 no rubs  Abdomen:  Soft, Non tender, BS present  Extremities: Trace b/l LE edema  Neurologic: Awake, alert, follows commands  Skin: No acute rashes  Access: LUE AVF       Basic Metabolic Panel:   Recent Labs Lab 11/22/15 0540 11/23/15 0501 11/26/15 0457  NA 140 139 137  K 4.5 5.7* 4.2  CL 100* 97* 97*  CO2 34* 29 33*  GLUCOSE 150* 84 62*  BUN 17 25* 13  CREATININE 2.34* 3.11* 3.07*  CALCIUM 8.3* 8.0* 8.1*     CBC:  Recent Labs Lab 11/21/15 0445 11/22/15 0540 11/23/15 0501 11/26/15 0457  WBC 21.2* 15.8* 14.5* 5.9  NEUTROABS  --   --   --  3.3  HGB 10.1* 9.8* 10.3* 9.1*  HCT 31.8* 30.1* 32.9* 28.7*  MCV 84.5 82.8 84.6 85.2  PLT 211 245 270 234      Microbiology:  Recent Results (from the past 720 hour(s))  MRSA PCR Screening     Status: None   Collection Time: 11/13/15  9:46 PM  Result Value Ref Range Status   MRSA by PCR NEGATIVE NEGATIVE Final    Comment:        The GeneXpert MRSA Assay (FDA approved for NASAL  specimens only), is one component of a comprehensive MRSA colonization surveillance program. It is not intended to diagnose MRSA infection nor to guide or monitor treatment for MRSA infections.   Culture, blood (Routine X 2) w Reflex to ID Panel     Status: None   Collection Time: 11/14/15 12:53 AM  Result Value Ref Range Status   Specimen Description BLOOD RIGHT HAND  Final   Special Requests BOTTLES DRAWN AEROBIC AND ANAEROBIC 5ML  Final   Culture NO GROWTH 6 DAYS  Final   Report Status 11/20/2015 FINAL  Final  Culture, blood (Routine X 2) w Reflex to ID Panel     Status: None   Collection Time: 11/14/15  7:14 AM  Result Value Ref Range Status   Specimen Description BLOOD RIGHT HAND  Final   Special Requests   Final    BOTTLES DRAWN AEROBIC AND ANAEROBIC AER 4ML ANA 2ML   Culture NO GROWTH 6 DAYS  Final   Report Status 11/20/2015 FINAL  Final  Urine culture     Status: None   Collection Time: 11/14/15 10:16 AM  Result Value Ref Range Status   Specimen Description URINE, RANDOM  Final   Special Requests NONE  Final   Culture NO GROWTH 1 DAY  Final   Report Status 11/15/2015 FINAL  Final  Culture, expectorated sputum-assessment     Status: None   Collection Time: 11/14/15  4:20 PM  Result Value Ref Range Status   Specimen Description SPU  Final   Special Requests NONE  Final   Sputum evaluation THIS SPECIMEN IS ACCEPTABLE FOR SPUTUM CULTURE  Final   Report Status 11/14/2015 FINAL  Final  Culture, respiratory (NON-Expectorated)     Status: None   Collection Time: 11/14/15  4:20 PM  Result Value Ref Range Status   Specimen Description SPU  Final   Special Requests NONE Reflexed from [place]:[id number]  Final   Gram Stain   Final    MANY WBC SEEN NO SQUAMOUS EPITHELIAL CELLS PRESENT FEW GRAM POSITIVE COCCI EXCELLENT SPECIMEN - 90-100% WBCS    Culture   Final    LIGHT GROWTH ENTEROBACTER CLOACAE MODERATE GROWTH STREPTOCOCCUS PNEUMONIAE    Report Status 11/18/2015 FINAL  Final    Organism ID, Bacteria ENTEROBACTER CLOACAE  Final   Organism ID, Bacteria STREPTOCOCCUS PNEUMONIAE  Final      Susceptibility   Streptococcus pneumoniae - MIC*    ERYTHROMYCIN <=0.12 SENSITIVE Sensitive     LEVOFLOXACIN 1 SENSITIVE Sensitive     VANCOMYCIN 0.5 SENSITIVE Sensitive     PENICILLIN <=0.06 SENSITIVE Sensitive     CEFTRIAXONE <=0.12 SENSITIVE Sensitive     * MODERATE GROWTH STREPTOCOCCUS PNEUMONIAE   Enterobacter cloacae - MIC*    PIP/TAZO Value in next row Sensitive      SENSITIVE8    CEFAZOLIN Value in next row Resistant      RESISTANT>=64    CEFEPIME Value in next row Sensitive      SENSITIVE<=1    CEFTAZIDIME Value in next row Sensitive      SENSITIVE<=1    CEFTRIAXONE Value in next row Sensitive      SENSITIVE<=1    CIPROFLOXACIN Value in next row Sensitive      SENSITIVE<=0.25    GENTAMICIN Value in next row Sensitive      SENSITIVE<=1    IMIPENEM Value in next row Sensitive      SENSITIVE1    TRIMETH/SULFA Value in next row Sensitive      SENSITIVE<=20    * LIGHT GROWTH ENTEROBACTER CLOACAE  C difficile quick scan w PCR reflex     Status: Abnormal   Collection Time: 11/15/15  7:36 AM  Result Value Ref Range Status   C Diff antigen POSITIVE (A) NEGATIVE Final   C Diff toxin NEGATIVE NEGATIVE Final   C Diff interpretation   Final    Negative for toxigenic C. difficile. Toxin gene and active toxin production not detected. May be a nontoxigenic strain of C. difficile bacteria present, lacking the ability to produce toxin.  Clostridium Difficile by PCR     Status: None   Collection Time: 11/15/15  7:36 AM  Result Value Ref Range Status   Toxigenic C Difficile by pcr NEGATIVE NEGATIVE Final    Coagulation Studies: No results for input(s): LABPROT, INR in the last 72 hours.  Urinalysis: No results for input(s): COLORURINE, LABSPEC, PHURINE, GLUCOSEU, HGBUR, BILIRUBINUR, KETONESUR, PROTEINUR, UROBILINOGEN, NITRITE, LEUKOCYTESUR in the last 72 hours.  Invalid  input(s): APPERANCEUR    Imaging: No results found.   Medications:     . acidophilus  2  capsule Oral TID  . amiodarone  200 mg Oral Daily  . atorvastatin  20 mg Oral QPM  . bacitracin   Topical BID  . chlorhexidine gluconate  15 mL Mouth Rinse BID  . cholecalciferol  400 Units Oral Daily  . citalopram  20 mg Oral Daily  . clopidogrel  75 mg Oral Daily  . guaiFENesin  600 mg Oral BID  . insulin aspart  0-5 Units Subcutaneous QHS  . insulin aspart  0-9 Units Subcutaneous TID WC  . levofloxacin (LEVAQUIN) IV  500 mg Intravenous Q48H  . lipase/protease/amylase  12,000 Units Oral TID WC  . pantoprazole  40 mg Oral Q1200  . vitamin B-12  1,000 mcg Oral Daily   acetaminophen **OR** acetaminophen, albuterol, alum & mag hydroxide-simeth, bisacodyl, dextrose, HYDROcodone-acetaminophen, iohexol, nitroGLYCERIN, senna-docusate  Assessment/ Plan:  55 y.o. 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   [facility] MWF [place]  1. End Stage Renal Disease: MWF. Last hemodialysis yesterday.  - Next treatment for tomorrow. Monitor electrolytes.   2. Anemia of chronic kidney disease: hemoglobin 9.1 -  holding off on epogen given right renal masses x 2. Discontinued micera as an outpatient.   3.  Acute resp failure: now 3 litres [place]. Secondary to combination of Pneumonia, Anasarca, large pleural effusions   4.  Secondary Hyperparathyroidism: PTH low at 111. Phosphorus at low.   5. Gastroparesis, Nausea and vomiting - symptomatic management  6.  Renal mass x 2.  Two small enhancing masses in upper pole of right kidney, suspicious for small renal cell carcinomas seen on abdominal CT with IV contrast on February 20 Patient will need to f/u with urology.        LOSJuleen China,  Lurena Nida 3/7/201712:00 PM

## 2015-11-27 NOTE — Progress Notes (Signed)
Physical Therapy Treatment Patient Details Name: [name] MRN: [id number] DOB: July 31, 1961 Today's Date: 11/27/2015    History of Present Illness Pt is a 55 y.o. female with a known history of ESRD, CAD and Asacol failure who presents with abdominal pain, nausea and vomiting for the past day. Patient is also complaining of shortness of breath. Patient did not go for dialysis today because of abdominal pain. CT of the abdomen ordered in the emergency room showed no acute pathology. Patient reports that her granddaughters have been sick. Pt is AOx3 at time of evaluation but disoriented to situation. Poor short term memory and unable to provide extensive details regarding prior function a living situation. Significant portion of history obtained from medical record    PT Comments    At beginning of session pt was complaining of 8/10 pain in chest (nursing notified of chest pain, nursing reported that pt has been reporting this pain intermittently and per work up does not appear cardiac related), L upper thorax and B shins and requested pain meds.  Nursing was notified immediately of pt's request for pain meds. Pt performed AROM exercises in bed.  Pt was CGA for sidelying to sit and min assist for B LE for sit to sidelying.  Pt performed sit to stand with RW and min assist.  Pt complained of significant pain in B shins with standing, limiting activity, and was put back into bed.  Nursing was notified at end of session of B shin pain during standing.   Follow Up Recommendations  SNF     Equipment Recommendations  None recommended by PT    Recommendations for Other Services       Precautions / Restrictions Precautions Precautions: Fall Restrictions Weight Bearing Restrictions: No    Mobility  Bed Mobility Overal bed mobility: Needs Assistance Bed Mobility: Supine to Sit;Sit to Supine     Supine to sit: Supervision Sit to supine: Min assist   General bed mobility comments: required  extra time and min assist for B LE navigation.  Transfers Overall transfer level: Needs assistance Equipment used: Rolling walker (2 wheeled) Transfers: Sit to/from Stand Sit to Stand: Min assist         General transfer comment: required extra time and VC and tactile cues for foot and hand placment.  Pt complained of significant pain in B shins upon standing.    Ambulation/Gait             General Gait Details: Was not attempted due to pain.   Stairs            Wheelchair Mobility    Modified Rankin (Stroke Patients Only)       Balance Overall balance assessment: Needs assistance Sitting-balance support: Feet supported Sitting balance-Leahy Scale: Fair     Standing balance support: Bilateral upper extremity supported (RW) Standing balance-Leahy Scale: Fair                      Cognition Arousal/Alertness: Awake/alert Behavior During Therapy: Flat affect Overall Cognitive Status: No family/caregiver present to determine baseline cognitive functioning                      Exercises Total Joint Exercises Ankle Circles/Pumps: AROM;Both;10 reps Hip ABduction/ADduction: AROM;Both;10 reps Straight Leg Raises: AROM;Both;10 reps    General Comments   Nursing was contacted and cleared pt for physical therapy.  Pt was agreeable and session was modified due to fatigue and pain.  Nursing was notified of pt request for pain meds and pt's pain.         Pertinent Vitals/Pain Pain Assessment: 0-10 Pain Score: 8  Pain Location: Anterior chest, L upper thorax, and B anterior shins Pain Descriptors / Indicators: Aching;Burning;Crying Pain Intervention(s): Limited activity within patient's tolerance;Monitored during session;Repositioned;Patient requesting pain meds-RN notified  See flow sheet for vitals.     Home Living                      Prior Function            PT Goals (current goals can now be found in the care plan section)  Acute Rehab PT Goals Patient Stated Goal: To be able to stand and walk  PT Goal Formulation: With patient Time For Goal Achievement: 12/11/15 Potential to Achieve Goals: Fair Progress towards PT goals: Progressing toward goals    Frequency  Min 2X/week    PT Plan Current plan remains appropriate    Co-evaluation             End of Session Equipment Utilized During Treatment: Gait belt;Oxygen (2 L/min O2 via nasal cannula ) Activity Tolerance: Patient limited by fatigue;Patient limited by pain Patient left: in bed;with call bell/phone within reach;with bed alarm set     Time: [id number] PT Time Calculation (min) (ACUTE ONLY): 30 min  Charges:                       G Codes:      Mittie Bodo, SPT Mittie Bodo 11/27/2015, 4:20 PM

## 2015-11-27 NOTE — Progress Notes (Signed)
Nutrition Follow-up     INTERVENTION:  Meals and snacks: Recommend calorie count to better document intake.  Discussed with RN, Zaneta and placed instructions in room Medical Nutrition Supplement Therapy:continue magic cup and mightyshakes at this time Coordination of care: May need to consider nutrition support if unable to meet nutritional needs   NUTRITION DIAGNOSIS:   Inadequate oral intake related to altered GI function as evidenced by per patient/family report.    GOAL:   Patient will meet greater than or equal to 90% of their needs    MONITOR:    (Energy intake, Digestive system)  REASON FOR ASSESSMENT:   Consult Enteral/tube feeding initiation and management  ASSESSMENT:     Pt lethargic this am, unable to arouse   Current Nutrition: full breakfast tray at bedside, not alert enough to eat.  Per I and O sheet limited documentation of intake since 2/27   Gastrointestinal Profile: Last BM: 3/6   Scheduled Medications:  . acidophilus  2 capsule Oral TID  . amiodarone  200 mg Oral Daily  . atorvastatin  20 mg Oral QPM  . bacitracin   Topical BID  . chlorhexidine gluconate  15 mL Mouth Rinse BID  . cholecalciferol  400 Units Oral Daily  . citalopram  20 mg Oral Daily  . clopidogrel  75 mg Oral Daily  . guaiFENesin  600 mg Oral BID  . insulin aspart  0-5 Units Subcutaneous QHS  . insulin aspart  0-9 Units Subcutaneous TID WC  . levofloxacin (LEVAQUIN) IV  500 mg Intravenous Q48H  . lipase/protease/amylase  12,000 Units Oral TID WC  . pantoprazole  40 mg Oral Q1200  . vitamin B-12  1,000 mcg Oral Daily       Electrolyte/Renal Profile and Glucose Profile:   Recent Labs Lab 11/22/15 0540 11/23/15 0501 11/26/15 0457  NA 140 139 137  K 4.5 5.7* 4.2  CL 100* 97* 97*  CO2 34* 29 33*  BUN 17 25* 13  CREATININE 2.34* 3.11* 3.07*  CALCIUM 8.3* 8.0* 8.1*  GLUCOSE 150* 84 62*       Weight Trend since Admission: Filed Weights   11/24/15 1730  11/26/15 1000 11/26/15 1330  Weight: 145 lb 15.1 oz (66.2 kg) 149 lb 14.6 oz (68 kg) 146 lb 9.7 oz (66.5 kg)      Diet Order:  DIET DYS 2 Room service appropriate?: Yes with Assist; Fluid consistency:: Nectar Thick  Skin:   (stage II pressure ulcer sacrum)   Height:   Ht Readings from Last 1 Encounters:  11/12/15 5\' 5"  (1.651 m)    Weight:   Wt Readings from Last 1 Encounters:  11/26/15 146 lb 9.7 oz (66.5 kg)    Ideal Body Weight:     BMI:  Body mass index is 24.4 kg/(m^2).  Estimated Nutritional Needs:   Kcal:  1887 kcals (Ve: 9.1, Tmax: 39.2) using wt of 73 kg  Protein:  110-146 g(1.5-2.0 g/kg)   Fluid:  (1065ml + UOP)  EDUCATION NEEDS:   No education needs identified at this time  HIGH Care Level  Amorie Rentz B. Zenia Resides, [place], [place] (pager) Weekend/On-Call pager [phone])

## 2015-11-28 ENCOUNTER — Inpatient Hospital Stay: Payer: Medicare Other

## 2015-11-28 LAB — GLUCOSE, CAPILLARY
Glucose-Capillary: 110 mg/dL — ABNORMAL HIGH (ref 65–99)
Glucose-Capillary: 123 mg/dL — ABNORMAL HIGH (ref 65–99)
Glucose-Capillary: 56 mg/dL — ABNORMAL LOW (ref 65–99)
Glucose-Capillary: 68 mg/dL (ref 65–99)
Glucose-Capillary: 76 mg/dL (ref 65–99)
Glucose-Capillary: 96 mg/dL (ref 65–99)

## 2015-11-28 LAB — LACTATE DEHYDROGENASE, PLEURAL OR PERITONEAL FLUID: LD, Fluid: 104 U/L — ABNORMAL HIGH (ref 3–23)

## 2015-11-28 LAB — BODY FLUID CELL COUNT WITH DIFFERENTIAL
Eos, Fluid: 2 %
Lymphs, Fluid: 63 %
Monocyte-Macrophage-Serous Fluid: 19 %
Neutrophil Count, Fluid: 16 %
Other Cells, Fluid: 0 %
Total Nucleated Cell Count, Fluid: 13743 cu mm

## 2015-11-28 LAB — PROTEIN, BODY FLUID: Total protein, fluid: <3 g/dL

## 2015-11-28 LAB — GLUCOSE, SEROUS FLUID: Glucose, Fluid: 123 mg/dL

## 2015-11-28 IMAGING — CR DG CHEST 1V PORT
1 series · 1 of 1 positions shown · non-contrast
Comparison: 07/20/2015.

CLINICAL DATA: PICC placement.

EXAM:
PORTABLE CHEST 1 VIEW

[portable]
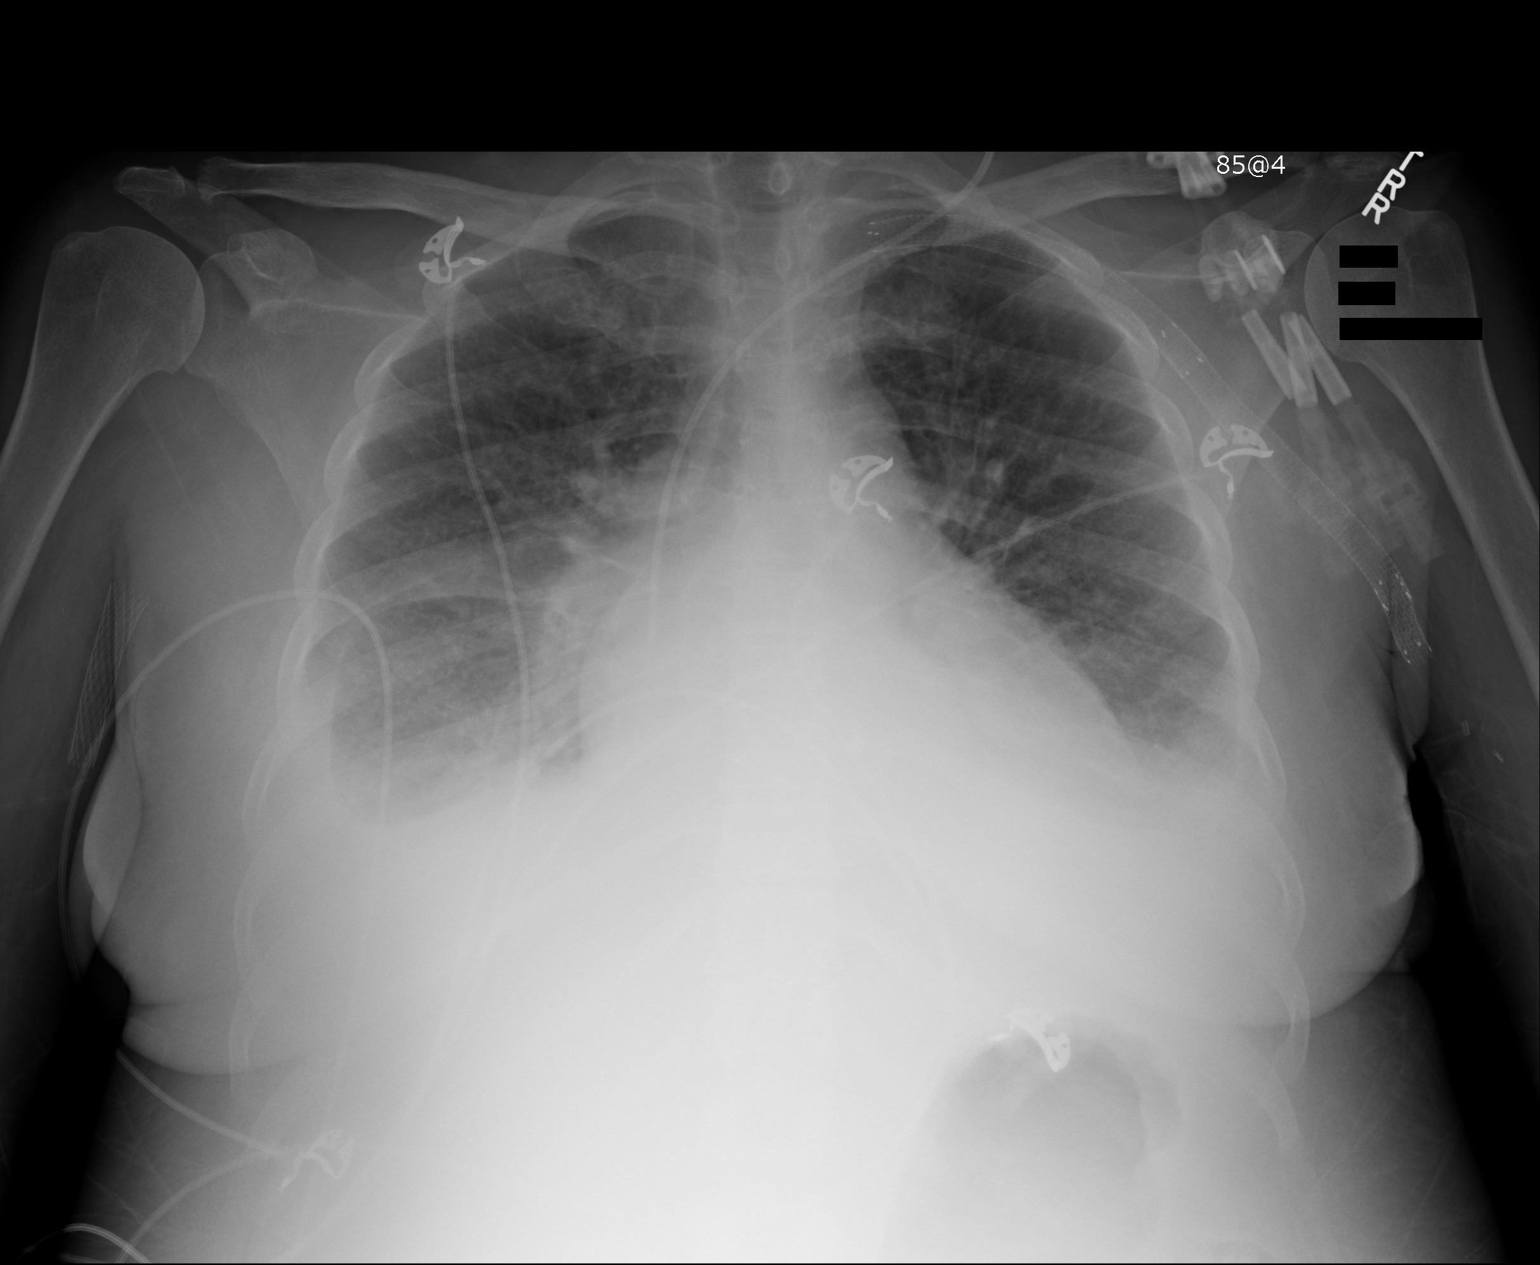

[1 of 1 positions shown; findings below may reference images not displayed]

FINDINGS: Stable left subclavian and axillary vascular stent. Interval left
subclavian catheter with its tip at the junction of the superior
vena cava and right atrium. No peripheral intravenous catheter seen.
Borderline enlarged cardiac silhouette without significant change.
Increased bilateral pleural fluid and bibasilar patchy opacity.
Unremarkable bones.
IMPRESSION: 1. Left subclavian catheter tip at the superior cavoatrial junction.
2. Progressive changes of congestive heart failure.

## 2015-11-28 NOTE — Progress Notes (Signed)
Attempted to see patient today for consult re: right renal mass but patient at HD this afternoon.  Will see in AM given non-urgent consult.    Chart and imaging was reviewed today, recommendations discussed with Dr. Juleen China but not yet with patient.    Hollice Espy, MD

## 2015-11-28 NOTE — Progress Notes (Signed)
[name] MRN: [id number] DOB: 08/16/1961   Cancelled Treatment:    Reason Eval/Treat Not Completed: Patient at procedure or test/unavailable (Pt off floor at dialysis.)  Will re-attempt PT at a later date/time.   [name] [name] 11/28/2015, 4:24 PM [name], [name]

## 2015-11-28 NOTE — Progress Notes (Signed)
CBG 44, pt asymptomatic, followed hypoglycemic protocol, given 2 nectar thick drinks. CBG 59 when rechecked, given 2 more nectar thick drinks. CBG now 110. Will continue to monitor pt.

## 2015-11-28 NOTE — Progress Notes (Signed)
TX started 

## 2015-11-28 NOTE — Progress Notes (Signed)
Post HD  

## 2015-11-28 NOTE — Progress Notes (Signed)
Inpatient Diabetes Program Recommendations  AACE/ADA: New Consensus Statement on Inpatient Glycemic Control (2015)  Target Ranges:  Prepandial:   less than 140 mg/dL      Peak postprandial:   less than 180 mg/dL (1-2 hours)      Critically ill patients:  140 - 180 mg/dL  Results for [name], [name] (MRN [id number]) as of 11/28/2015 13:02  Ref. Range 11/27/2015 07:59 11/27/2015 09:09 11/27/2015 11:18 11/27/2015 16:34 11/27/2015 21:14 11/27/2015 22:44 11/27/2015 23:48 11/28/2015 07:38 11/28/2015 12:13  Glucose-Capillary Latest Ref Range: 65-99 mg/dL 50 (L) 138 (H) 114 (H) 151 (H) 44 (LL) 59 (L) 110 (H) 123 (H) 96   Review of Glycemic Control  Diabetes history: DM2 Outpatient Diabetes medications: None Current orders for Inpatient glycemic control: Novolog 0-9 units TID with meals, Novolog 0-5 units QHS  Inpatient Diabetes Program Recommendations: Correction (SSI): Patient is having frequent hypoglycemia episodes. Please discontinue Novolog correction scale.  Thanks, Barnie Alderman, RN, MSN, CDE Diabetes Coordinator Inpatient Diabetes Program [phone] (Team Pager from [place] to [place]) [phone] ([facility] office) [phone] [facility] office) [phone] [facility] office)

## 2015-11-28 NOTE — Clinical Social Work Note (Signed)
Sierra at [facility] was stating that patient would be responsible for paying for her transportation to her outpatient dialysis appointments. CSW informed her that patient goes to her outpatient dialysis via [facility]. Helene Kelp at [facility] is working on getting transportation arranged for patient and will work with patient if need be on payment for the transportation. Shela Leff MSW,LCSW [phone]

## 2015-11-28 NOTE — Progress Notes (Signed)
Pre HD  

## 2015-11-28 NOTE — Progress Notes (Signed)
Tx ended    

## 2015-11-28 NOTE — Progress Notes (Signed)
Pre hd 

## 2015-11-28 NOTE — Progress Notes (Signed)
Spoke to Dr. Anselm Jungling regarding patient's blood sugar. AM CBG check was 123. Patient had two hypoglycemic episodes last night and now patient NPO with D5W running. Verbal order to not give morning insulin dose of 1 unit. Will continue to monitor CBGs.

## 2015-11-28 NOTE — Progress Notes (Signed)
[place] at [place] NAME: [name]    MR#:  [id number]  DATE OF BIRTH:  01-06-1961  SUBJECTIVE:   Low BS for the past 3 days. BS was 47 this am, possible due to poor appetite. On 2 L oxygen. S/p thoracentesis 11/28/15. 950 ml fluid removed. Not eating much, Blood sugar runs on lower normal side,  REVIEW OF SYSTEMS:   Constitutional: no fever or chills, but has poor appetite and oral intake, generalized weakness. HENT: Negative for sore throat.  Eyes: Negative for blurred vision, double vision and pain.  Respiratory: Negative for cough, hemoptysis, shortness of breath and wheezing.  Cardiovascular: Negative for chest pain, palpitations, orthopnea and leg swelling.  Gastrointestinal: Negative for heartburn, no abdominal pain, nausea, vomiting, diarrhea or constipation.  Genitourinary: Negative for dysuria and hematuria.  Musculoskeletal: Negative for back pain and joint pain.  Skin: Negative for rash.  Neurological: Negative for sensory change, speech change, focal weakness and headaches.    DRUG ALLERGIES:   Allergies  Allergen Reactions  . Compazine [Prochlorperazine Edisylate] Anaphylaxis and Nausea And Vomiting  . Ace Inhibitors Swelling  . Ativan [Lorazepam] Other (See Comments)    Reaction:  Hallucinations and headaches  . Codeine Nausea And Vomiting  . Gabapentin Other (See Comments)    Reaction:  Unknown   . Losartan Other (See Comments)    Reaction:  Unknown   . Ondansetron Other (See Comments)    Reaction:  Unknown   . Prochlorperazine Other (See Comments)    Reaction:  Unknown   . Scopolamine Other (See Comments)    Reaction:  Unknown   . Zofran [Ondansetron Hcl] Other (See Comments)    Reaction:  hallucinations   . Oxycodone Anxiety  . Tape Rash    VITALS:  Blood pressure 140/50, pulse 72, temperature 98.1 F (36.7 C), temperature source Axillary, resp. rate 17, height 5\' 5"  (1.651 m), weight  66.8 kg (147 lb 4.3 oz), SpO2 100 %.  PHYSICAL EXAMINATION:   Physical Exam  Constitutional: [name] is well-developed, well-nourished, and in no distress. No distress.  HENT:  Head: Normocephalic.  Nose: Nose normal.  Decreased hearing  Eyes: No scleral icterus.  Neck: No JVD present. No tracheal deviation present.  Cardiovascular: Normal rate, regular rhythm and normal heart sounds.  Exam reveals no gallop and no friction rub.   No murmur heard. Chest wall tenderness  Pulmonary/Chest: Effort normal. No respiratory distress. [name] has decreased breath sounds in the right lower field and the left lower field. [name] has no wheezes. [name] has no rales. [name] exhibits no tenderness.  Abdominal: Soft. Bowel sounds are normal. [name] exhibits no distension and no mass. There is no tenderness. There is no rebound and no guarding.  Musculoskeletal: Normal range of motion. [name] exhibits no edema.  Skin: Skin is warm. No rash noted. No erythema.    LABORATORY PANEL:   CBC  Recent Labs Lab 11/26/15 0457  WBC 5.9  HGB 9.1*  HCT 28.7*  PLT 234   ------------------------------------------------------------------------------------------------------------------  Chemistries   Recent Labs Lab 11/26/15 0457  NA 137  K 4.2  CL 97*  CO2 33*  GLUCOSE 62*  BUN 13  CREATININE 3.07*  CALCIUM 8.1*   ------------------------------------------------------------------------------------------------------------------  Cardiac Enzymes No results for input(s): TROPONINI in the last 168 hours. ------------------------------------------------------------------------------------------------------------------  RADIOLOGY:  Dg Chest 2 View  11/28/2015  CLINICAL DATA:  55 year old female status post left thoracentesis EXAM: CHEST  2 VIEW COMPARISON:  Pre thoracentesis chest x-ray 11/24/2006 FINDINGS: No evidence of left-sided pneumothorax. Significant interval decrease in the left layering pleural effusion. There  is some residual atelectasis in the left base. Persistent moderate right pleural effusion with associated atelectasis. Cardiomegaly and pulmonary vascular congestion. Left subclavian approach central venous catheter with the tip overlying the superior cavoatrial junction. Overlapping stents are present in the left subclavian and axillary veins. IMPRESSION: 1. No evidence of pneumothorax status post left-sided thoracentesis. 2. Significant interval decrease in the size of left pleural effusion. There is some persistent residual atelectasis of the left lung base. 3. Unchanged moderate layering right pleural effusion with associated right basilar atelectasis. 4. Cardiomegaly and pulmonary vascular congestion bordering on mild edema. Electronically Signed   By: Jacqulynn Cadet M.D.   On: 11/28/2015 10:25   US Thoracentesis Asp Pleural Space W/img Guide  11/28/2015  INDICATION: 55 year old female with end-stage renal disease on hemodialysis. [name] has bilateral moderate pleural effusions. After sonographic evaluation, the effusions are relatively symmetric in size although the right contains a few thin internal septations. Therefore, a left-sided thoracentesis will be performed. EXAM: ULTRASOUND GUIDED LEFT THORACENTESIS MEDICATIONS: None. COMPLICATIONS: None immediate. PROCEDURE: An ultrasound guided thoracentesis was thoroughly discussed with the patient and questions answered. The benefits, risks, alternatives and complications were also discussed. The patient understands and wishes to proceed with the procedure. Written consent was obtained. Ultrasound was performed to localize and mark an adequate pocket of fluid in the left chest. The area was then prepped and draped in the normal sterile fashion. 1% Lidocaine was used for local anesthesia. Under ultrasound guidance a 6 Fr Safe-T-Centesis catheter was introduced. Thoracentesis was performed. The catheter was removed and a dressing applied. FINDINGS: A total of  approximately 950 mL of dark amber colored fluid was removed. Samples were sent to the laboratory as requested by the clinical team. IMPRESSION: Successful ultrasound guided left thoracentesis yielding 950 mL of pleural fluid. Electronically Signed   By: Jacqulynn Cadet M.D.   On: 11/28/2015 10:13     ASSESSMENT AND PLAN:   55 year old female with end-stage renal disease on hemodialysis, diabetes with retinopathy, gastroparesis and neuropathy who initially presented with abdominal pain and subsequently has acute respiratory failure secondary to pneumonia.  # Bilateral pneumonia with Streptococcus and enterobacter On levaquin. Will need this till 11/29/2015. Appreciate ID help.  # Acute respiratory failure due to pneumonia and pulmonary edema with bilateral large pleural effusions Patient was intubated and on ventilation, is status post extubation. CT scan shows no acute pulmonary emboli however did show large bilateral pleural effusions and multifocal pneumonia. Thoracentesis done- shows many WBCs- I spoke to ID for follow up.   [name] may need further drainage.  # Acute on chronic diastolic heart failure in the setting of ESRD - Improved Repeated CXR showed CHF. Continue dialysis.  # Septic shock: Due to C. difficile and pneumonia Resolved  # Diabetic Gastroparesis Continue Phenergan and Reglan  # ESRD on hemodialysis: Continue dialysis Monday, Wednesday and Friday.  # Renal mass -Suggestive of early RCC. Urology consult requested   #Type 2 diabetes with gastroparesis: on Sliding scale insulin.  * Hypoglycemia. Due to poor oral intake, encourage oral intake. Not on basal insulin. Dietitian consult.  Started D5 IV, monitor.  On dietary supplementation.  #Dysphagia:  as per speech [name] has signs and symptoms of chronic aspiration   Dysphagia 2 (fine chop);Nectar-thick liquid with aspiration precautions   Management plans discussed with the patient and [name] is in  agreement.  Greater than 50% time was spent on coordination of care and face-to-face counseling. CODE STATUS: FULL  Prognosis  Very poor, called palliative care consult.  TOTAL TIME TAKING CARE OF THIS PATIENT: 35 minutes.   Likely discharge to skilled nursing facility  tomorrow.  Vaughan Basta M.D on 11/28/2015 at 9:00 PM  Between 7am to 6pm - Pager - [phone]  After 6pm go to [email or web address] - password [email or web address] [facility]  Office  [phone]  CC: Primary care physician; Ellamae Sia, MD  Note: This dictation was prepared with Dragon dictation along with smaller phrase technology. Any transcriptional errors that result from this process are unintentional.

## 2015-11-28 NOTE — Progress Notes (Signed)
Subjective:   Hemodialysis treatment for later today.  Son at bedside.   Objective:  Vital signs in last 24 hours:  Temp:  [97.6 F (36.4 C)-98.1 F (36.7 C)] 98.1 F (36.7 C) (03/08 0603) Pulse Rate:  [64-73] 64 (03/08 0941) Resp:  [16-22] 22 (03/08 0941) BP: (139-171)/(49-74) 171/70 mmHg (03/08 0941) SpO2:  [99 %-100 %] 100 % (03/08 0941)  Weight change:  Filed Weights   11/24/15 1730 11/26/15 1000 11/26/15 1330  Weight: 66.2 kg (145 lb 15.1 oz) 68 kg (149 lb 14.6 oz) 66.5 kg (146 lb 9.7 oz)    Intake/Output:    Intake/Output Summary (Last 24 hours) at 11/28/15 1157 Last data filed at 11/28/15 0811  Gross per 24 hour  Intake  925.2 ml  Output      3 ml  Net  922.2 ml     Physical Exam: General: No acute distress  HEENT Anicteric, adentulous  Neck supple  Pulm/lungs Clear , 2 L [place] O2  CV S1S2 no rubs  Abdomen:  Soft, Non tender, BS present  Extremities: Trace b/l LE edema  Neurologic: Awake, alert, follows commands  Skin: No acute rashes  Access: LUE AVF       Basic Metabolic Panel:   Recent Labs Lab 11/22/15 0540 11/23/15 0501 11/26/15 0457  NA 140 139 137  K 4.5 5.7* 4.2  CL 100* 97* 97*  CO2 34* 29 33*  GLUCOSE 150* 84 62*  BUN 17 25* 13  CREATININE 2.34* 3.11* 3.07*  CALCIUM 8.3* 8.0* 8.1*     CBC:  Recent Labs Lab 11/22/15 0540 11/23/15 0501 11/26/15 0457  WBC 15.8* 14.5* 5.9  NEUTROABS  --   --  3.3  HGB 9.8* 10.3* 9.1*  HCT 30.1* 32.9* 28.7*  MCV 82.8 84.6 85.2  PLT 245 270 234      Microbiology:  Recent Results (from the past 720 hour(s))  MRSA PCR Screening     Status: None   Collection Time: 11/13/15  9:46 PM  Result Value Ref Range Status   MRSA by PCR NEGATIVE NEGATIVE Final    Comment:        The GeneXpert MRSA Assay (FDA approved for NASAL specimens only), is one component of a comprehensive MRSA colonization surveillance program. It is not intended to diagnose MRSA infection nor to guide or monitor  treatment for MRSA infections.   Culture, blood (Routine X 2) w Reflex to ID Panel     Status: None   Collection Time: 11/14/15 12:53 AM  Result Value Ref Range Status   Specimen Description BLOOD RIGHT HAND  Final   Special Requests BOTTLES DRAWN AEROBIC AND ANAEROBIC 5ML  Final   Culture NO GROWTH 6 DAYS  Final   Report Status 11/20/2015 FINAL  Final  Culture, blood (Routine X 2) w Reflex to ID Panel     Status: None   Collection Time: 11/14/15  7:14 AM  Result Value Ref Range Status   Specimen Description BLOOD RIGHT HAND  Final   Special Requests   Final    BOTTLES DRAWN AEROBIC AND ANAEROBIC AER 4ML ANA 2ML   Culture NO GROWTH 6 DAYS  Final   Report Status 11/20/2015 FINAL  Final  Urine culture     Status: None   Collection Time: 11/14/15 10:16 AM  Result Value Ref Range Status   Specimen Description URINE, RANDOM  Final   Special Requests NONE  Final   Culture NO GROWTH 1 DAY  Final  Report Status 11/15/2015 FINAL  Final  Culture, expectorated sputum-assessment     Status: None   Collection Time: 11/14/15  4:20 PM  Result Value Ref Range Status   Specimen Description SPU  Final   Special Requests NONE  Final   Sputum evaluation THIS SPECIMEN IS ACCEPTABLE FOR SPUTUM CULTURE  Final   Report Status 11/14/2015 FINAL  Final  Culture, respiratory (NON-Expectorated)     Status: None   Collection Time: 11/14/15  4:20 PM  Result Value Ref Range Status   Specimen Description SPU  Final   Special Requests NONE Reflexed from [id number]  Final   Gram Stain   Final    MANY WBC SEEN NO SQUAMOUS EPITHELIAL CELLS PRESENT FEW GRAM POSITIVE COCCI EXCELLENT SPECIMEN - 90-100% WBCS    Culture   Final    LIGHT GROWTH ENTEROBACTER CLOACAE MODERATE GROWTH STREPTOCOCCUS PNEUMONIAE    Report Status 11/18/2015 FINAL  Final   Organism ID, Bacteria ENTEROBACTER CLOACAE  Final   Organism ID, Bacteria STREPTOCOCCUS PNEUMONIAE  Final      Susceptibility   Streptococcus pneumoniae - MIC*     ERYTHROMYCIN <=0.12 SENSITIVE Sensitive     LEVOFLOXACIN 1 SENSITIVE Sensitive     VANCOMYCIN 0.5 SENSITIVE Sensitive     PENICILLIN <=0.06 SENSITIVE Sensitive     CEFTRIAXONE <=0.12 SENSITIVE Sensitive     * MODERATE GROWTH STREPTOCOCCUS PNEUMONIAE   Enterobacter cloacae - MIC*    PIP/TAZO Value in next row Sensitive      SENSITIVE8    CEFAZOLIN Value in next row Resistant      RESISTANT>=64    CEFEPIME Value in next row Sensitive      SENSITIVE<=1    CEFTAZIDIME Value in next row Sensitive      SENSITIVE<=1    CEFTRIAXONE Value in next row Sensitive      SENSITIVE<=1    CIPROFLOXACIN Value in next row Sensitive      SENSITIVE<=0.25    GENTAMICIN Value in next row Sensitive      SENSITIVE<=1    IMIPENEM Value in next row Sensitive      SENSITIVE1    TRIMETH/SULFA Value in next row Sensitive      SENSITIVE<=20    * LIGHT GROWTH ENTEROBACTER CLOACAE  C difficile quick scan w PCR reflex     Status: Abnormal   Collection Time: 11/15/15  7:36 AM  Result Value Ref Range Status   C Diff antigen POSITIVE (A) NEGATIVE Final   C Diff toxin NEGATIVE NEGATIVE Final   C Diff interpretation   Final    Negative for toxigenic C. difficile. Toxin gene and active toxin production not detected. May be a nontoxigenic strain of C. difficile bacteria present, lacking the ability to produce toxin.  Clostridium Difficile by PCR     Status: None   Collection Time: 11/15/15  7:36 AM  Result Value Ref Range Status   Toxigenic C Difficile by pcr NEGATIVE NEGATIVE Final    Coagulation Studies: No results for input(s): LABPROT, INR in the last 72 hours.  Urinalysis: No results for input(s): COLORURINE, LABSPEC, PHURINE, GLUCOSEU, HGBUR, BILIRUBINUR, KETONESUR, PROTEINUR, UROBILINOGEN, NITRITE, LEUKOCYTESUR in the last 72 hours.  Invalid input(s): APPERANCEUR    Imaging: Dg Chest 2 View  11/28/2015  CLINICAL DATA:  55 year old female status post left thoracentesis EXAM: CHEST  2 VIEW COMPARISON:   Pre thoracentesis chest x-ray 11/24/2006 FINDINGS: No evidence of left-sided pneumothorax. Significant interval decrease in the left layering pleural effusion. There is  some residual atelectasis in the left base. Persistent moderate right pleural effusion with associated atelectasis. Cardiomegaly and pulmonary vascular congestion. Left subclavian approach central venous catheter with the tip overlying the superior cavoatrial junction. Overlapping stents are present in the left subclavian and axillary veins. IMPRESSION: 1. No evidence of pneumothorax status post left-sided thoracentesis. 2. Significant interval decrease in the size of left pleural effusion. There is some persistent residual atelectasis of the left lung base. 3. Unchanged moderate layering right pleural effusion with associated right basilar atelectasis. 4. Cardiomegaly and pulmonary vascular congestion bordering on mild edema. Electronically Signed   By: Jacqulynn Cadet M.D.   On: 11/28/2015 10:25   US Thoracentesis Asp Pleural Space W/img Guide  11/28/2015  INDICATION: 55 year old female with end-stage renal disease on hemodialysis. She has bilateral moderate pleural effusions. After sonographic evaluation, the effusions are relatively symmetric in size although the right contains a few thin internal septations. Therefore, a left-sided thoracentesis will be performed. EXAM: ULTRASOUND GUIDED LEFT THORACENTESIS MEDICATIONS: None. COMPLICATIONS: None immediate. PROCEDURE: An ultrasound guided thoracentesis was thoroughly discussed with the patient and questions answered. The benefits, risks, alternatives and complications were also discussed. The patient understands and wishes to proceed with the procedure. Written consent was obtained. Ultrasound was performed to localize and mark an adequate pocket of fluid in the left chest. The area was then prepped and draped in the normal sterile fashion. 1% Lidocaine was used for local anesthesia. Under  ultrasound guidance a 6 Fr Safe-T-Centesis catheter was introduced. Thoracentesis was performed. The catheter was removed and a dressing applied. FINDINGS: A total of approximately 950 mL of dark amber colored fluid was removed. Samples were sent to the laboratory as requested by the clinical team. IMPRESSION: Successful ultrasound guided left thoracentesis yielding 950 mL of pleural fluid. Electronically Signed   By: Jacqulynn Cadet M.D.   On: 11/28/2015 10:13     Medications:   . dextrose 50 mL/hr at 11/27/15 1817   . acidophilus  2 capsule Oral TID  . amiodarone  200 mg Oral Daily  . atorvastatin  20 mg Oral QPM  . bacitracin   Topical BID  . chlorhexidine gluconate  15 mL Mouth Rinse BID  . cholecalciferol  400 Units Oral Daily  . citalopram  20 mg Oral Daily  . clopidogrel  75 mg Oral Daily  . guaiFENesin  600 mg Oral BID  . insulin aspart  0-5 Units Subcutaneous QHS  . insulin aspart  0-9 Units Subcutaneous TID WC  . levofloxacin (LEVAQUIN) IV  500 mg Intravenous Q48H  . lipase/protease/amylase  12,000 Units Oral TID WC  . pantoprazole  40 mg Oral Q1200  . vitamin B-12  1,000 mcg Oral Daily   acetaminophen **OR** acetaminophen, albuterol, alum & mag hydroxide-simeth, bisacodyl, dextrose, HYDROcodone-acetaminophen, iohexol, nitroGLYCERIN, senna-docusate  Assessment/ Plan:  55 y.o. 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   [facility] MWF [place]  1. End Stage Renal Disease: MWF.  - Next treatment for later today. Monitor electrolytes.   2. Anemia of chronic kidney disease: hemoglobin 9.1 -  holding off on epogen given right renal masses x 2. Discontinued micera as an outpatient.   3.  Acute resp failure: now 2 litres [place]. Secondary to combination of Pneumonia,  Anasarca, large pleural effusions   4.  Secondary Hyperparathyroidism: PTH low at  111. Phosphorus at low.   5. Gastroparesis, Nausea and vomiting and now dysphagia and aspiration - symptomatic management  6.  Renal mass x 2.  Two small enhancing masses in upper pole of right kidney, suspicious for small renal cell carcinomas seen on abdominal CT with IV contrast on February 20 - Will need outpatient follow up with urology      LOS:  [name] 3/8/201711:57 AM

## 2015-11-29 DIAGNOSIS — E876 Hypokalemia: Secondary | ICD-10-CM

## 2015-11-29 DIAGNOSIS — Z9911 Dependence on respirator [ventilator] status: Secondary | ICD-10-CM

## 2015-11-29 DIAGNOSIS — J9600 Acute respiratory failure, unspecified whether with hypoxia or hypercapnia: Secondary | ICD-10-CM

## 2015-11-29 DIAGNOSIS — R601 Generalized edema: Secondary | ICD-10-CM

## 2015-11-29 DIAGNOSIS — J189 Pneumonia, unspecified organism: Secondary | ICD-10-CM

## 2015-11-29 DIAGNOSIS — R0600 Dyspnea, unspecified: Secondary | ICD-10-CM

## 2015-11-29 DIAGNOSIS — E161 Other hypoglycemia: Secondary | ICD-10-CM

## 2015-11-29 DIAGNOSIS — D72829 Elevated white blood cell count, unspecified: Secondary | ICD-10-CM

## 2015-11-29 DIAGNOSIS — I959 Hypotension, unspecified: Secondary | ICD-10-CM

## 2015-11-29 DIAGNOSIS — N2889 Other specified disorders of kidney and ureter: Secondary | ICD-10-CM | POA: Insufficient documentation

## 2015-11-29 DIAGNOSIS — J90 Pleural effusion, not elsewhere classified: Secondary | ICD-10-CM

## 2015-11-29 DIAGNOSIS — Z515 Encounter for palliative care: Secondary | ICD-10-CM

## 2015-11-29 LAB — BASIC METABOLIC PANEL
Anion gap: 4 — ABNORMAL LOW (ref 5–15)
BUN: 10 mg/dL (ref 6–20)
CO2: 36 mmol/L — ABNORMAL HIGH (ref 22–32)
Calcium: 8 mg/dL — ABNORMAL LOW (ref 8.9–10.3)
Chloride: 98 mmol/L — ABNORMAL LOW (ref 101–111)
Creatinine, Ser: 2.49 mg/dL — ABNORMAL HIGH (ref 0.44–1.00)
GFR calc Af Amer: 24 mL/min — ABNORMAL LOW (ref >60)
GFR calc non Af Amer: 21 mL/min — ABNORMAL LOW (ref >60)
Glucose, Bld: 59 mg/dL — ABNORMAL LOW (ref 65–99)
Potassium: 4.3 mmol/L (ref 3.5–5.1)
Sodium: 138 mmol/L (ref 135–145)

## 2015-11-29 LAB — COMPREHENSIVE METABOLIC PANEL
ALT: 12 U/L — ABNORMAL LOW (ref 14–54)
AST: 17 U/L (ref 15–41)
Albumin: 1.9 g/dL — ABNORMAL LOW (ref 3.5–5.0)
Alkaline Phosphatase: 182 U/L — ABNORMAL HIGH (ref 38–126)
Anion gap: 6 (ref 5–15)
BUN: 15 mg/dL (ref 6–20)
CO2: 33 mmol/L — ABNORMAL HIGH (ref 22–32)
Calcium: 8.1 mg/dL — ABNORMAL LOW (ref 8.9–10.3)
Chloride: 95 mmol/L — ABNORMAL LOW (ref 101–111)
Creatinine, Ser: 3.18 mg/dL — ABNORMAL HIGH (ref 0.44–1.00)
GFR calc Af Amer: 18 mL/min — ABNORMAL LOW (ref >60)
GFR calc non Af Amer: 15 mL/min — ABNORMAL LOW (ref >60)
Glucose, Bld: 71 mg/dL (ref 65–99)
Potassium: 4.8 mmol/L (ref 3.5–5.1)
Sodium: 134 mmol/L — ABNORMAL LOW (ref 135–145)
Total Bilirubin: 0.9 mg/dL (ref 0.3–1.2)
Total Protein: 6.1 g/dL — ABNORMAL LOW (ref 6.5–8.1)

## 2015-11-29 LAB — GLUCOSE, CAPILLARY
Glucose-Capillary: 89 mg/dL (ref 65–99)
Glucose-Capillary: 48 mg/dL — ABNORMAL LOW (ref 65–99)
Glucose-Capillary: 54 mg/dL — ABNORMAL LOW (ref 65–99)
Glucose-Capillary: 76 mg/dL (ref 65–99)
Glucose-Capillary: 86 mg/dL (ref 65–99)
Glucose-Capillary: 91 mg/dL (ref 65–99)
Glucose-Capillary: 95 mg/dL (ref 65–99)
Glucose-Capillary: 59 mg/dL — ABNORMAL LOW (ref 65–99)
Glucose-Capillary: 69 mg/dL (ref 65–99)

## 2015-11-29 LAB — PROTEIN, TOTAL: Total Protein: 6.5 g/dL (ref 6.5–8.1)

## 2015-11-29 LAB — PH, BODY FLUID: pH, Body Fluid: 7.9

## 2015-11-29 LAB — TRIGLYCERIDES, BODY FLUIDS: Triglycerides, Fluid: 19 mg/dL

## 2015-11-29 MED ORDER — PREDNISONE 10 MG PO TABS
10.0000 mg | ORAL_TABLET | Freq: Every day | ORAL | Status: DC
  Administered 2015-11-30 – 2015-12-01 (×2): 10 mg via ORAL
  Filled 2015-11-29 (×2): qty 1

## 2015-11-29 NOTE — Consult Note (Signed)
Urology Consult  I have been asked to see the patient by Dr. Juleen China, for evaluation and management of right renal mass x 2 .  Chief Complaint: right renal mass  History of Present Illness: [name] is a 55 y.o. year old renal disease on hemodialysis, CAD, DM with neuropathy, COPD chronic diastolic heart failure admitted on 11/12/2015 with abdominal pain and nausea.  She is also developed acute respiratory failure secondary to pneumonia for which she is currently being treated.  As part of her workup, she underwent CT scan which revealed incidental right renal masses 2, concerning for RCC.  These multiple enhancing tumors measuring 1.6 cm and 11 mm both on the right.  Bilateral kidneys were noted to be atrophic with cortical thinning.  Review of previous CT scan from 02/22/2015 to show presence of these tumors although with a more subtle appearance but relatively unchanged in size.  [name] is somewhat lethargic today and is not able to contribute much additional history.  Past Medical History  Diagnosis Date  . Asthma   . Collagen vascular disease ([place])   . Diabetes mellitus without complication ([place])   . Hypertension   . Coronary artery disease     a. cath 2013: stenting to RCA (report not available); b. cath 2014: LM nl, pLAD 40%, mLAD nl, ost LCx 40%, mid LCx nl, pRCA 30% @ site of prior stent, mRCA 50%  . Diabetic neuropathy ([place])   . ESRD (end stage renal disease) on dialysis ([place])     M-W-F  . GERD (gastroesophageal reflux disease)   . Hx of pancreatitis 2015  . Myocardial infarction ([place])   . Pneumonia   . Mitral regurgitation     a. echo 10/2013: EF 62%, noWMA, mildly dilated LA, mild to mod MR/TR, GR1DD  . COPD (chronic obstructive pulmonary disease) ([place])   . chronic diastolic CHF [id number]  . dialysis 2006  . Depression   . Anxiety   . Arthritis   . Headache   . Anemia   . Renal insufficiency     Past Surgical History  Procedure Laterality Date  .  Cholecystectomy    . Appendectomy    . Abdominal hysterectomy    . Dialysis fistula creation Left     upper arm  . Esophagogastroduodenoscopy N/A 03/08/2015    Procedure: ESOPHAGOGASTRODUODENOSCOPY (EGD);  Surgeon: Manya Silvas, MD;  Location: [facility] ENDOSCOPY;  Service: Endoscopy;  Laterality: N/A;  . Cardiac catheterization Left 07/26/2015    Procedure: Left Heart Cath and Coronary Angiography;  Surgeon: Dionisio David, MD;  Location: [place] CV LAB;  Service: Cardiovascular;  Laterality: Left;  [facility] surgery    . Fecal transplant N/A 08/23/2015    Procedure: FECAL TRANSPLANT;  Surgeon: Manya Silvas, MD;  Location: [facility] ENDOSCOPY;  Service: Endoscopy;  Laterality: N/A;    Home Medications:    Medication List    ASK your doctor about these medications        acidophilus Caps capsule  Take 1 capsule by mouth daily.     albuterol (2.5 MG/3ML) 0.083% nebulizer solution  Commonly known as:  PROVENTIL  Take 2.5 mg by nebulization every 4 (four) hours as needed for wheezing or shortness of breath.     amiodarone 200 MG tablet  Commonly known as:  PACERONE  Take 400 mg by mouth daily.     amLODipine 10 MG tablet  Commonly known as:  NORVASC  Take 1 tablet by  mouth daily.     aspirin EC 81 MG tablet  Take 81 mg by mouth daily.     atorvastatin 20 MG tablet  Commonly known as:  LIPITOR  Take 20 mg by mouth every evening.     b complex-vitamin c-folic acid 0.8 MG Tabs tablet  Take 1 tablet by mouth daily.     carvedilol 25 MG tablet  Commonly known as:  COREG  Take 25 mg by mouth 2 (two) times daily with a meal.     carvedilol 25 MG tablet  Commonly known as:  COREG  Take 25 mg by mouth 2 (two) times daily with a meal.     cetirizine 10 MG tablet  Commonly known as:  ZYRTEC  Take 10 mg by mouth daily.     citalopram 20 MG tablet  Commonly known as:  CELEXA  Take 20 mg by mouth daily.     clopidogrel 75 MG tablet  Commonly known as:  PLAVIX  Take 75 mg by  mouth daily.     glucose 4 GM chewable tablet  Chew 3-4 tablets by mouth as needed for low blood sugar (if pts blood sugar is less than 60).     HYDROcodone-acetaminophen 5-325 MG tablet  Commonly known as:  NORCO/VICODIN  Take 1 tablet by mouth every 6 (six) hours as needed for severe pain.     lipase/protease/amylase 12000 units Cpep capsule  Commonly known as:  CREON  Take 12,000 Units by mouth 3 (three) times daily with meals.     metoCLOPramide 5 MG tablet  Commonly known as:  REGLAN  Take 1 tablet (5 mg total) by mouth 4 (four) times daily -  before meals and at bedtime.     metroNIDAZOLE 500 MG tablet  Commonly known as:  FLAGYL  Take 500 mg by mouth every 8 (eight) hours.     nitroGLYCERIN 0.4 MG SL tablet  Commonly known as:  NITROSTAT  Place 0.4 mg under the tongue every 5 (five) minutes x 3 doses as needed for chest pain. *Max 3 doses per episode*     pantoprazole 40 MG tablet  Commonly known as:  PROTONIX  Take 40 mg by mouth daily.     pregabalin 25 MG capsule  Commonly known as:  LYRICA  Take 25 mg by mouth 2 (two) times daily as needed (for leg pain).     promethazine 12.5 MG tablet  Commonly known as:  PHENERGAN  1-2 tablets by mouth every 8 hours as needed for nausea and vomiting     promethazine 25 MG suppository  Commonly known as:  PHENERGAN  Place 1 suppository (25 mg total) rectally every 6 (six) hours as needed for nausea.     promethazine 25 MG tablet  Commonly known as:  PHENERGAN  Take 1 tablet (25 mg total) by mouth every 6 (six) hours as needed for nausea or vomiting.     ranolazine 500 MG 12 hr tablet  Commonly known as:  RANEXA  Take 1,000 mg by mouth 2 (two) times daily.     sevelamer carbonate 800 MG tablet  Commonly known as:  RENVELA  Take 800 mg by mouth 3 (three) times daily with meals.     vitamin B-12 1000 MCG tablet  Commonly known as:  CYANOCOBALAMIN  Take 1,000 mcg by mouth daily.     Vitamin D3 400 units tablet  Take  400 Units by mouth daily.        Allergies:  Allergies  Allergen Reactions  . Compazine [Prochlorperazine Edisylate] Anaphylaxis and Nausea And Vomiting  . Ace Inhibitors Swelling  . Ativan [Lorazepam] Other (See Comments)    Reaction:  Hallucinations and headaches  . Codeine Nausea And Vomiting  . Gabapentin Other (See Comments)    Reaction:  Unknown   . Losartan Other (See Comments)    Reaction:  Unknown   . Ondansetron Other (See Comments)    Reaction:  Unknown   . Prochlorperazine Other (See Comments)    Reaction:  Unknown   . Scopolamine Other (See Comments)    Reaction:  Unknown   . Zofran [Ondansetron Hcl] Other (See Comments)    Reaction:  hallucinations   . Oxycodone Anxiety  . Tape Rash    Family History  Problem Relation Age of Onset  . Kidney disease Mother   . Diabetes Mother     Social History:  reports that she has been smoking Cigarettes.  She has been smoking about 0.50 packs per day. She has never used smokeless tobacco. She reports that she does not drink alcohol or use illicit drugs.  ROS: Patient is lethargic, sleepy, and not willing to answer questions. Unable to complete a full review of system. She does note that she is then pain but does not localize this.  Physical Exam:  Vital signs in last 24 hours: Temp:  [97.7 F (36.5 C)-98.1 F (36.7 C)] 97.7 F (36.5 C) (03/09 0914) Pulse Rate:  [55-72] 70 (03/09 1022) Resp:  [10-17] 16 (03/09 0914) BP: (140-160)/(49-58) 141/50 mmHg (03/09 0914) SpO2:  [99 %-100 %] 99 % (03/09 1022) Weight:  [147 lb 4.3 oz (66.8 kg)] 147 lb 4.3 oz (66.8 kg) (03/08 1732) Constitutional:  Much older appearing than stated age, wearing oxygen, lethargic but arousable. HEENT: [place] AT, moist mucus membranes.  Trachea midline, no masses Cardiovascular: Regular rate and rhythm, no clubbing, cyanosis, or edema. Respiratory: Diminished. Wearing oxygen. GI: Abdomen is soft, nontender, nondistended, no abdominal masses GU: No  CVA tenderness Skin: No rashes, bruises or suspicious lesions Lymph: No cervical or inguinal adenopathy Neurologic: Grossly intact, no focal deficits, moving all 4 extremities Psychiatric: Flat affect.   Laboratory Data:    Recent Labs  11/29/15 0431  NA 138  K 4.3  CL 98*  CO2 36*  GLUCOSE 59*  BUN 10  CREATININE 2.49*  CALCIUM 8.0*   No results for input(s): LABPT, INR in the last 72 hours. No results for input(s): LABURIN in the last 72 hours. Results for orders placed or performed during the hospital encounter of 11/12/15  MRSA PCR Screening     Status: None   Collection Time: 11/13/15  9:46 PM  Result Value Ref Range Status   MRSA by PCR NEGATIVE NEGATIVE Final    Comment:        The GeneXpert MRSA Assay (FDA approved for NASAL specimens only), is one component of a comprehensive MRSA colonization surveillance program. It is not intended to diagnose MRSA infection nor to guide or monitor treatment for MRSA infections.   Culture, blood (Routine X 2) w Reflex to ID Panel     Status: None   Collection Time: 11/14/15 12:53 AM  Result Value Ref Range Status   Specimen Description BLOOD RIGHT HAND  Final   Special Requests BOTTLES DRAWN AEROBIC AND ANAEROBIC 5ML  Final   Culture NO GROWTH 6 DAYS  Final   Report Status 11/20/2015 FINAL  Final  Culture, blood (Routine X 2) w Reflex to  ID Panel     Status: None   Collection Time: 11/14/15  7:14 AM  Result Value Ref Range Status   Specimen Description BLOOD RIGHT HAND  Final   Special Requests   Final    BOTTLES DRAWN AEROBIC AND ANAEROBIC AER 4ML ANA 2ML   Culture NO GROWTH 6 DAYS  Final   Report Status 11/20/2015 FINAL  Final  Urine culture     Status: None   Collection Time: 11/14/15 10:16 AM  Result Value Ref Range Status   Specimen Description URINE, RANDOM  Final   Special Requests NONE  Final   Culture NO GROWTH 1 DAY  Final   Report Status 11/15/2015 FINAL  Final  Culture, expectorated sputum-assessment      Status: None   Collection Time: 11/14/15  4:20 PM  Result Value Ref Range Status   Specimen Description SPU  Final   Special Requests NONE  Final   Sputum evaluation THIS SPECIMEN IS ACCEPTABLE FOR SPUTUM CULTURE  Final   Report Status 11/14/2015 FINAL  Final  Culture, respiratory (NON-Expectorated)     Status: None   Collection Time: 11/14/15  4:20 PM  Result Value Ref Range Status   Specimen Description SPU  Final   Special Requests NONE Reflexed from [id number]  Final   Gram Stain   Final    MANY WBC SEEN NO SQUAMOUS EPITHELIAL CELLS PRESENT FEW GRAM POSITIVE COCCI EXCELLENT SPECIMEN - 90-100% WBCS    Culture   Final    LIGHT GROWTH ENTEROBACTER CLOACAE MODERATE GROWTH STREPTOCOCCUS PNEUMONIAE    Report Status 11/18/2015 FINAL  Final   Organism ID, Bacteria ENTEROBACTER CLOACAE  Final   Organism ID, Bacteria STREPTOCOCCUS PNEUMONIAE  Final      Susceptibility   Streptococcus pneumoniae - MIC*    ERYTHROMYCIN <=0.12 SENSITIVE Sensitive     LEVOFLOXACIN 1 SENSITIVE Sensitive     VANCOMYCIN 0.5 SENSITIVE Sensitive     PENICILLIN <=0.06 SENSITIVE Sensitive     CEFTRIAXONE <=0.12 SENSITIVE Sensitive     * MODERATE GROWTH STREPTOCOCCUS PNEUMONIAE   Enterobacter cloacae - MIC*    PIP/TAZO Value in next row Sensitive      SENSITIVE8    CEFAZOLIN Value in next row Resistant      RESISTANT>=64    CEFEPIME Value in next row Sensitive      SENSITIVE<=1    CEFTAZIDIME Value in next row Sensitive      SENSITIVE<=1    CEFTRIAXONE Value in next row Sensitive      SENSITIVE<=1    CIPROFLOXACIN Value in next row Sensitive      SENSITIVE<=0.25    GENTAMICIN Value in next row Sensitive      SENSITIVE<=1    IMIPENEM Value in next row Sensitive      SENSITIVE1    TRIMETH/SULFA Value in next row Sensitive      SENSITIVE<=20    * LIGHT GROWTH ENTEROBACTER CLOACAE  C difficile quick scan w PCR reflex     Status: Abnormal   Collection Time: 11/15/15  7:36 AM  Result Value Ref Range  Status   C Diff antigen POSITIVE (A) NEGATIVE Final   C Diff toxin NEGATIVE NEGATIVE Final   C Diff interpretation   Final    Negative for toxigenic C. difficile. Toxin gene and active toxin production not detected. May be a nontoxigenic strain of C. difficile bacteria present, lacking the ability to produce toxin.  Clostridium Difficile by PCR     Status: None  Collection Time: 11/15/15  7:36 AM  Result Value Ref Range Status   Toxigenic C Difficile by pcr NEGATIVE NEGATIVE Final     Radiologic Imaging: Study Result     CLINICAL DATA: Nausea and vomiting for 3 days. Upper abdominal pain for 1 week. Chronic renal failure on dialysis.  EXAM: CT ABDOMEN AND PELVIS WITH CONTRAST  TECHNIQUE: Multidetector CT imaging of the abdomen and pelvis was performed using the standard protocol following bolus administration of intravenous contrast.  CONTRAST: 138mL OMNIPAQUE IOHEXOL 300 MG/ML SOLN  COMPARISON: 07/22/2015  FINDINGS: Lower chest: Stable mild cardiomegaly. Moderate bilateral pleural effusions and bibasilar atelectasis show no significant change.  Hepatobiliary: No masses identified. Periportal edema noted. Prior cholecystectomy noted. No evidence of biliary dilatation.  Pancreas: No mass, inflammatory changes, or other significant abnormality.  Spleen: Within normal limits in size and appearance.  Adrenals/Urinary Tract: No adrenal masses identified. Bilateral diffuse renal parenchymal atrophy, consistent with end-stage renal disease. No evidence of hydronephrosis.  Two enhancing lesions are seen in the upper pole the right kidney measuring 1.3 cm anteriorly on image 37/series 2 and 11 mm posteriorly on image 36/series 2. These are suspicious for small renal cell carcinomas. No evidence hydronephrosis. Unopacified urinary bladder is unremarkable in appearance.  Stomach/Bowel: No evidence of obstruction, inflammatory process, or abnormal fluid  collections. Sigmoid diverticulosis. No radiographic evidence of diverticulitis.  Vascular/Lymphatic: No pathologically enlarged lymph nodes. No evidence of abdominal aortic aneurysm.  Reproductive: Prior hysterectomy noted. Adnexal regions are unremarkable in appearance.  Other: Diffuse body wall edema again demonstrated.  Musculoskeletal: No suspicious bone lesions identified.  IMPRESSION: Colonic diverticulosis. No radiographic evidence of diverticulitis or bowel obstruction.  Stable moderate bilateral pleural effusions and diffuse body wall edema/anasarca.  Diffuse bilateral renal atrophy, consistent with end-stage renal disease. Two small enhancing masses in upper pole of right kidney, suspicious for small renal cell carcinomas. Recommend urology referral and consider abdomen MRI without contrast for further evaluation.   Electronically Signed  By: Earle Gell M.D.  On: 11/12/2015 11:44   CT scan personally reviewed today. This is directly compared to CT abdomen pelvis with contrast performed 02/2015 with relatively unchanged size of renal masses on right.  Impression/Assessment:   1. Incidental right renal mass x 2, measuring 1.3 and 1.1 cm respectively. Although these lesions enhance and are quite suspicious for renal cell carcinoma, is quite reassuring that have not increased dramatically in size of the course of 9 months.    In general, a solid renal mass raises the suspicion of primary renal malignancy, approximately 80 % chance based on radiographic characteristics.  The risk of metastasis increases as the size of solid renal mass increases. In general, it is believed that the risk of metastasis for renal masses less than 3-4 cm is small (up to approximately 5%) based mainly on large retrospective studies.  Additionally, the growth rate of small renal masses tends to be on the order of millimeters/ annually. In some cases and especially in patients of older  age and multiple comorbidities a surveillance approach may be appropriate.   2. ESRD on HD  3. Multiple additional medical comorbidities  4. Poor functional status   Plan:  -No intervention for renal masses recommended at this time given the patient's overall functional status and small size of masses -Recommend follow-up in 6 months with repeat CT abdomen with and without contrast   11/29/2015, 2:32 PM  Hollice Espy,  MD   Thank you for involving me in this patient's  care.  Urology to sign off.

## 2015-11-29 NOTE — Progress Notes (Signed)
Subjective:   Hemodialysis yesterday. Tolerated treatment well.   Objective:  Vital signs in last 24 hours:  Temp:  [97.3 F (36.3 C)-98.1 F (36.7 C)] 97.7 F (36.5 C) (03/09 0914) Pulse Rate:  [55-72] 66 (03/09 0914) Resp:  [10-19] 16 (03/09 0914) BP: (140-160)/(49-67) 141/50 mmHg (03/09 0914) SpO2:  [99 %-100 %] 100 % (03/09 0914) Weight:  [66.8 kg (147 lb 4.3 oz)-67.8 kg (149 lb 7.6 oz)] 66.8 kg (147 lb 4.3 oz) (03/08 1732)  Weight change:  Filed Weights   11/26/15 1330 11/28/15 1415 11/28/15 1732  Weight: 66.5 kg (146 lb 9.7 oz) 67.8 kg (149 lb 7.6 oz) 66.8 kg (147 lb 4.3 oz)    Intake/Output:    Intake/Output Summary (Last 24 hours) at 11/29/15 1143 Last data filed at 11/29/15 1000  Gross per 24 hour  Intake    480 ml  Output   1000 ml  Net   -520 ml     Physical Exam: General: No acute distress  HEENT Anicteric, adentulous  Neck supple  Pulm/lungs Clear , 2 L [place] O2  CV S1S2 no rubs  Abdomen:  Soft, Non tender, BS present  Extremities: Trace b/l LE edema  Neurologic: Awake, alert, follows commands  Skin: No acute rashes  Access: LUE AVF       Basic Metabolic Panel:   Recent Labs Lab 11/23/15 0501 11/26/15 0457 11/29/15 0431  NA 139 137 138  K 5.7* 4.2 4.3  CL 97* 97* 98*  CO2 29 33* 36*  GLUCOSE 84 62* 59*  BUN 25* 13 10  CREATININE 3.11* 3.07* 2.49*  CALCIUM 8.0* 8.1* 8.0*     CBC:  Recent Labs Lab 11/23/15 0501 11/26/15 0457  WBC 14.5* 5.9  NEUTROABS  --  3.3  HGB 10.3* 9.1*  HCT 32.9* 28.7*  MCV 84.6 85.2  PLT 270 234      Microbiology:  Recent Results (from the past 720 hour(s))  MRSA PCR Screening     Status: None   Collection Time: 11/13/15  9:46 PM  Result Value Ref Range Status   MRSA by PCR NEGATIVE NEGATIVE Final    Comment:        The GeneXpert MRSA Assay (FDA approved for NASAL specimens only), is one component of a comprehensive MRSA colonization surveillance program. It is not intended to diagnose  MRSA infection nor to guide or monitor treatment for MRSA infections.   Culture, blood (Routine X 2) w Reflex to ID Panel     Status: None   Collection Time: 11/14/15 12:53 AM  Result Value Ref Range Status   Specimen Description BLOOD RIGHT HAND  Final   Special Requests BOTTLES DRAWN AEROBIC AND ANAEROBIC 5ML  Final   Culture NO GROWTH 6 DAYS  Final   Report Status 11/20/2015 FINAL  Final  Culture, blood (Routine X 2) w Reflex to ID Panel     Status: None   Collection Time: 11/14/15  7:14 AM  Result Value Ref Range Status   Specimen Description BLOOD RIGHT HAND  Final   Special Requests   Final    BOTTLES DRAWN AEROBIC AND ANAEROBIC AER 4ML ANA 2ML   Culture NO GROWTH 6 DAYS  Final   Report Status 11/20/2015 FINAL  Final  Urine culture     Status: None   Collection Time: 11/14/15 10:16 AM  Result Value Ref Range Status   Specimen Description URINE, RANDOM  Final   Special Requests NONE  Final   Culture  NO GROWTH 1 DAY  Final   Report Status 11/15/2015 FINAL  Final  Culture, expectorated sputum-assessment     Status: None   Collection Time: 11/14/15  4:20 PM  Result Value Ref Range Status   Specimen Description SPU  Final   Special Requests NONE  Final   Sputum evaluation THIS SPECIMEN IS ACCEPTABLE FOR SPUTUM CULTURE  Final   Report Status 11/14/2015 FINAL  Final  Culture, respiratory (NON-Expectorated)     Status: None   Collection Time: 11/14/15  4:20 PM  Result Value Ref Range Status   Specimen Description SPU  Final   Special Requests NONE Reflexed from [id number]  Final   Gram Stain   Final    MANY WBC SEEN NO SQUAMOUS EPITHELIAL CELLS PRESENT FEW GRAM POSITIVE COCCI EXCELLENT SPECIMEN - 90-100% WBCS    Culture   Final    LIGHT GROWTH ENTEROBACTER CLOACAE MODERATE GROWTH STREPTOCOCCUS PNEUMONIAE    Report Status 11/18/2015 FINAL  Final   Organism ID, Bacteria ENTEROBACTER CLOACAE  Final   Organism ID, Bacteria STREPTOCOCCUS PNEUMONIAE  Final      Susceptibility    Streptococcus pneumoniae - MIC*    ERYTHROMYCIN <=0.12 SENSITIVE Sensitive     LEVOFLOXACIN 1 SENSITIVE Sensitive     VANCOMYCIN 0.5 SENSITIVE Sensitive     PENICILLIN <=0.06 SENSITIVE Sensitive     CEFTRIAXONE <=0.12 SENSITIVE Sensitive     * MODERATE GROWTH STREPTOCOCCUS PNEUMONIAE   Enterobacter cloacae - MIC*    PIP/TAZO Value in next row Sensitive      SENSITIVE8    CEFAZOLIN Value in next row Resistant      RESISTANT>=64    CEFEPIME Value in next row Sensitive      SENSITIVE<=1    CEFTAZIDIME Value in next row Sensitive      SENSITIVE<=1    CEFTRIAXONE Value in next row Sensitive      SENSITIVE<=1    CIPROFLOXACIN Value in next row Sensitive      SENSITIVE<=0.25    GENTAMICIN Value in next row Sensitive      SENSITIVE<=1    IMIPENEM Value in next row Sensitive      SENSITIVE1    TRIMETH/SULFA Value in next row Sensitive      SENSITIVE<=20    * LIGHT GROWTH ENTEROBACTER CLOACAE  C difficile quick scan w PCR reflex     Status: Abnormal   Collection Time: 11/15/15  7:36 AM  Result Value Ref Range Status   C Diff antigen POSITIVE (A) NEGATIVE Final   C Diff toxin NEGATIVE NEGATIVE Final   C Diff interpretation   Final    Negative for toxigenic C. difficile. Toxin gene and active toxin production not detected. May be a nontoxigenic strain of C. difficile bacteria present, lacking the ability to produce toxin.  Clostridium Difficile by PCR     Status: None   Collection Time: 11/15/15  7:36 AM  Result Value Ref Range Status   Toxigenic C Difficile by pcr NEGATIVE NEGATIVE Final    Coagulation Studies: No results for input(s): LABPROT, INR in the last 72 hours.  Urinalysis: No results for input(s): COLORURINE, LABSPEC, PHURINE, GLUCOSEU, HGBUR, BILIRUBINUR, KETONESUR, PROTEINUR, UROBILINOGEN, NITRITE, LEUKOCYTESUR in the last 72 hours.  Invalid input(s): APPERANCEUR    Imaging: Dg Chest 2 View  11/28/2015  CLINICAL DATA:  55 year old female status post left  thoracentesis EXAM: CHEST  2 VIEW COMPARISON:  Pre thoracentesis chest x-ray 11/24/2006 FINDINGS: No evidence of left-sided pneumothorax. Significant interval decrease  in the left layering pleural effusion. There is some residual atelectasis in the left base. Persistent moderate right pleural effusion with associated atelectasis. Cardiomegaly and pulmonary vascular congestion. Left subclavian approach central venous catheter with the tip overlying the superior cavoatrial junction. Overlapping stents are present in the left subclavian and axillary veins. IMPRESSION: 1. No evidence of pneumothorax status post left-sided thoracentesis. 2. Significant interval decrease in the size of left pleural effusion. There is some persistent residual atelectasis of the left lung base. 3. Unchanged moderate layering right pleural effusion with associated right basilar atelectasis. 4. Cardiomegaly and pulmonary vascular congestion bordering on mild edema. Electronically Signed   By: Jacqulynn Cadet M.D.   On: 11/28/2015 10:25   US Thoracentesis Asp Pleural Space W/img Guide  11/28/2015  INDICATION: 55 year old female with end-stage renal disease on hemodialysis. She has bilateral moderate pleural effusions. After sonographic evaluation, the effusions are relatively symmetric in size although the right contains a few thin internal septations. Therefore, a left-sided thoracentesis will be performed. EXAM: ULTRASOUND GUIDED LEFT THORACENTESIS MEDICATIONS: None. COMPLICATIONS: None immediate. PROCEDURE: An ultrasound guided thoracentesis was thoroughly discussed with the patient and questions answered. The benefits, risks, alternatives and complications were also discussed. The patient understands and wishes to proceed with the procedure. Written consent was obtained. Ultrasound was performed to localize and mark an adequate pocket of fluid in the left chest. The area was then prepped and draped in the normal sterile fashion. 1%  Lidocaine was used for local anesthesia. Under ultrasound guidance a 6 Fr Safe-T-Centesis catheter was introduced. Thoracentesis was performed. The catheter was removed and a dressing applied. FINDINGS: A total of approximately 950 mL of dark amber colored fluid was removed. Samples were sent to the laboratory as requested by the clinical team. IMPRESSION: Successful ultrasound guided left thoracentesis yielding 950 mL of pleural fluid. Electronically Signed   By: Jacqulynn Cadet M.D.   On: 11/28/2015 10:13     Medications:     . acidophilus  2 capsule Oral TID  . amiodarone  200 mg Oral Daily  . atorvastatin  20 mg Oral QPM  . bacitracin   Topical BID  . chlorhexidine gluconate  15 mL Mouth Rinse BID  . cholecalciferol  400 Units Oral Daily  . citalopram  20 mg Oral Daily  . clopidogrel  75 mg Oral Daily  . guaiFENesin  600 mg Oral BID  . insulin aspart  0-5 Units Subcutaneous QHS  . insulin aspart  0-9 Units Subcutaneous TID WC  . levofloxacin (LEVAQUIN) IV  500 mg Intravenous Q48H  . lipase/protease/amylase  12,000 Units Oral TID WC  . pantoprazole  40 mg Oral Q1200  . vitamin B-12  1,000 mcg Oral Daily   acetaminophen **OR** acetaminophen, albuterol, alum & mag hydroxide-simeth, bisacodyl, dextrose, HYDROcodone-acetaminophen, iohexol, nitroGLYCERIN, senna-docusate  Assessment/ Plan:  55 y.o. 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   [facility] MWF [place]  1. End Stage Renal Disease: MWF.  - Next treatment for tomorrow  2. Anemia of chronic kidney disease: hemoglobin 9.1 -  holding off on epogen given right renal masses x 2. Discontinued micera as an outpatient.   3.  Acute resp failure: now 2 litres Iva. Secondary to combination of Pneumonia, Anasarca, large  pleural effusions   4.  Secondary Hyperparathyroidism: PTH low at 111. Phosphorus at  low.   5. Renal mass x 2.  Two small enhancing masses in upper pole of right kidney, suspicious for small renal cell carcinomas seen on abdominal CT with IV contrast on February 20 - urology      LOS:  Juleen China, Lurena Nida 3/9/201711:43 AM

## 2015-11-29 NOTE — Progress Notes (Signed)
ID E note Pleural fluid reviewed. TP is low LDH is 104 but no corresponding serum LDH or TP done.   Cell count is 13,743 with mainly lymphs.   She has had several other pleural taps done 10/11/15 as well as last year.    ? If she has had full evaluation of these recurrent effusions.  Could consider consulting Dr Genevive Bi or reconsulting pulmonary

## 2015-11-29 NOTE — Care Management Important Message (Signed)
Important Message  Patient Details  Name: [name] MRN: [id number] Date of Birth: 04-06-1961   Medicare Important Message Given:  Yes    [name] A Valli Randol 11/29/2015, 12:05 PM

## 2015-11-29 NOTE — Progress Notes (Signed)
[place] [facility] [place] NAME: [name]    MR#:  [id number]  DATE OF BIRTH:  [birth year]  SUBJECTIVE:   Low BS for the past 3 days. BS was 47 this am, possible due to poor appetite. On 2 L oxygen. S/p thoracentesis 11/28/15. 950 ml fluid removed. Not eating much, Blood sugar runs on lower normal side,   She was started on solucortef IV by endocrine for hypoglycemia- and later it was stopped as blood sugar was higher.   I will resume steroids.  REVIEW OF SYSTEMS:   Constitutional: no fever or chills, but has poor appetite and oral intake, generalized weakness. HENT: Negative for sore throat.  Eyes: Negative for blurred vision, double vision and pain.  Respiratory: Negative for cough, hemoptysis, shortness of breath and wheezing.  Cardiovascular: Negative for chest pain, palpitations, orthopnea and leg swelling.  Gastrointestinal: Negative for heartburn, no abdominal pain, nausea, vomiting, diarrhea or constipation.  Genitourinary: Negative for dysuria and hematuria.  Musculoskeletal: Negative for back pain and joint pain.  Skin: Negative for rash.  Neurological: Negative for sensory change, speech change, focal weakness and headaches.    DRUG ALLERGIES:   Allergies  Allergen Reactions  . Compazine [Prochlorperazine Edisylate] Anaphylaxis and Nausea And Vomiting  . Ace Inhibitors Swelling  . Ativan [Lorazepam] Other (See Comments)    Reaction:  Hallucinations and headaches  . Codeine Nausea And Vomiting  . Gabapentin Other (See Comments)    Reaction:  Unknown   . Losartan Other (See Comments)    Reaction:  Unknown   . Ondansetron Other (See Comments)    Reaction:  Unknown   . Prochlorperazine Other (See Comments)    Reaction:  Unknown   . Scopolamine Other (See Comments)    Reaction:  Unknown   . Zofran [Ondansetron Hcl] Other (See Comments)    Reaction:  hallucinations   . Oxycodone Anxiety  . Tape Rash     VITALS:  Blood pressure 141/50, pulse 70, temperature 97.7 F (36.5 C), temperature source Oral, resp. rate 16, height 5\' 5"  (1.651 m), weight 66.8 kg (147 lb 4.3 oz), SpO2 99 %.  PHYSICAL EXAMINATION:   Physical Exam  Constitutional: She is well-developed, well-nourished, and in no distress. No distress.  HENT:  Head: Normocephalic.  Nose: Nose normal.  Decreased hearing  Eyes: No scleral icterus.  Neck: No JVD present. No tracheal deviation present.  Cardiovascular: Normal rate, regular rhythm and normal heart sounds.  Exam reveals no gallop and no friction rub.   No murmur heard. Chest wall tenderness  Pulmonary/Chest: Effort normal. No respiratory distress. She has decreased breath sounds in the right lower field and the left lower field. She has no wheezes. She has no rales. She exhibits no tenderness.  Abdominal: Soft. Bowel sounds are normal. She exhibits no distension and no mass. There is no tenderness. There is no rebound and no guarding.  Musculoskeletal: Normal range of motion. She exhibits no edema.  Skin: Skin is warm. No rash noted. No erythema.    LABORATORY PANEL:   CBC  Recent Labs Lab 11/26/15 0457  WBC 5.9  HGB 9.1*  HCT 28.7*  PLT 234   ------------------------------------------------------------------------------------------------------------------  Chemistries   Recent Labs Lab 11/29/15 0431  NA 138  K 4.3  CL 98*  CO2 36*  GLUCOSE 59*  BUN 10  CREATININE 2.49*  CALCIUM 8.0*   ------------------------------------------------------------------------------------------------------------------  Cardiac Enzymes No results for input(s): TROPONINI in the last 168  hours. ------------------------------------------------------------------------------------------------------------------  RADIOLOGY:  Dg Chest 2 View  11/28/2015  CLINICAL DATA:  55 year old female status post left thoracentesis EXAM: CHEST  2 VIEW COMPARISON:  Pre  thoracentesis chest x-ray 11/24/2006 FINDINGS: No evidence of left-sided pneumothorax. Significant interval decrease in the left layering pleural effusion. There is some residual atelectasis in the left base. Persistent moderate right pleural effusion with associated atelectasis. Cardiomegaly and pulmonary vascular congestion. Left subclavian approach central venous catheter with the tip overlying the superior cavoatrial junction. Overlapping stents are present in the left subclavian and axillary veins. IMPRESSION: 1. No evidence of pneumothorax status post left-sided thoracentesis. 2. Significant interval decrease in the size of left pleural effusion. There is some persistent residual atelectasis of the left lung base. 3. Unchanged moderate layering right pleural effusion with associated right basilar atelectasis. 4. Cardiomegaly and pulmonary vascular congestion bordering on mild edema. Electronically Signed   By: Jacqulynn Cadet M.D.   On: 11/28/2015 10:25   US Thoracentesis Asp Pleural Space W/img Guide  11/28/2015  INDICATION: 55 year old female with end-stage renal disease on hemodialysis. She has bilateral moderate pleural effusions. After sonographic evaluation, the effusions are relatively symmetric in size although the right contains a few thin internal septations. Therefore, a left-sided thoracentesis will be performed. EXAM: ULTRASOUND GUIDED LEFT THORACENTESIS MEDICATIONS: None. COMPLICATIONS: None immediate. PROCEDURE: An ultrasound guided thoracentesis was thoroughly discussed with the patient and questions answered. The benefits, risks, alternatives and complications were also discussed. The patient understands and wishes to proceed with the procedure. Written consent was obtained. Ultrasound was performed to localize and mark an adequate pocket of fluid in the left chest. The area was then prepped and draped in the normal sterile fashion. 1% Lidocaine was used for local anesthesia. Under  ultrasound guidance a 6 Fr Safe-T-Centesis catheter was introduced. Thoracentesis was performed. The catheter was removed and a dressing applied. FINDINGS: A total of approximately 950 mL of dark amber colored fluid was removed. Samples were sent to the laboratory as requested by the clinical team. IMPRESSION: Successful ultrasound guided left thoracentesis yielding 950 mL of pleural fluid. Electronically Signed   By: Jacqulynn Cadet M.D.   On: 11/28/2015 10:13     ASSESSMENT AND PLAN:   56 year old female with end-stage renal disease on hemodialysis, diabetes with retinopathy, gastroparesis and neuropathy who initially presented with abdominal pain and subsequently has acute respiratory failure secondary to pneumonia.  # Bilateral pneumonia with Streptococcus and enterobacter On levaquin. Will need this till 11/29/2015. Appreciate ID help. Will wait to see Interpretation of pleural fluid.  # Acute respiratory failure due to pneumonia and pulmonary edema with bilateral large pleural effusions Patient was intubated and on ventilation, is status post extubation. CT scan shows no acute pulmonary emboli however did show large bilateral pleural effusions and multifocal pneumonia. Thoracentesis done- shows many WBCs- I spoke to ID and pulm for follow up. Pt is asymptomatic and no fever.  # Acute on chronic diastolic heart failure in the setting of ESRD - Improved Repeated CXR showed CHF. Continue dialysis.  # Septic shock: Due to C. difficile and pneumonia Resolved  # Diabetic Gastroparesis Continue Phenergan and Reglan  # ESRD on hemodialysis: Continue dialysis Monday, Wednesday and Friday.  # Renal mass -Suggestive of early RCC. Urology consult requested   #Type 2 diabetes with gastroparesis: on Sliding scale insulin.  * Hypoglycemia. Due to poor oral intake, encourage oral intake. Not on basal insulin. Dietitian consult.  Started D5 IV, monitor.  On dietary supplementation.  Pt was started on solucortef on 11/14/15 by endocrine and later d/c due to hyperglycemia.    I will start on prednisone oral as she may have adrenal insufficiency.  #Dysphagia:  as per speech she has signs and symptoms of chronic aspiration   Dysphagia 2 (fine chop);Nectar-thick liquid with aspiration precautions   Management plans discussed with the patient and she is in agreement. Greater than 50% time was spent on coordination of care and face-to-face counseling. CODE STATUS: FULL  Prognosis  Very poor, called palliative care consult.  TOTAL TIME TAKING CARE OF THIS PATIENT: 35 minutes.   Likely discharge to skilled nursing facility  tomorrow.  Vaughan Basta M.D on 11/29/2015 at 5:02 PM  Between 7am to 6pm - Pager - [phone]  After 6pm go to [email or web address] - password [email or web address] [facility]  Office  [phone]  CC: Primary care physician; Ellamae Sia, MD  Note: This dictation was prepared with Dragon dictation along with smaller phrase technology. Any transcriptional errors that result from this process are unintentional.

## 2015-11-29 NOTE — Consult Note (Signed)
Palliative Medicine Inpatient Consult Note   Name: [name] Date: 11/29/2015 MRN: [id number]  DOB: 01/05/1961  Referring Physician: Vaughan Basta, MD  Palliative Care consult requested for this 55 y.o. female for goals of medical therapy in patient with ESRD and multiple medical comorbid conditions including acute pneumonia with respiratory failure and two small right renal masses.     TODAY: Today, I spoke with Robin [name] about pts high WBC in her pleural effusion, her persistent and daily hypoglycemia since hydrocortisone was stopped, and her acceptance at [facility].  I spoke briefly to pt, but she was lethargic and I plan to come back to talk with her about code status and her condition --now that I have combed through all the records.    She is severely ill and not really much improved. She could improve a bit with therapy, but her pulmonary process should be improved for this to be possible.   Need to talk to ID soon to see what may be next.  She will likely want treatment, but certainly, code status needs to be talked about.  She is not imminently dying and does not quite fit the category of one who would normally be under the care of Hospice. Many of her problems are chronic, but the ones keeping her here now are more in the acute category and are not yet resolved but partially improved.    I will follow daily until she is discharged.    HOSPITAL COURSE SUMMARY: Pt is a 55 yo woman who came in on 11/12/15 with abdominal pain and vomiting.  She has ESRD and CAD.  She did not go for HD on the day of admission (11/12/15) due to her pain.  CT of abdomen showed no acute pathology (though an incidental renal mass was noted).  Pt was here in January of this year with gastroparesis.  An ED note from 2/2 describes her history as involving 'cyclic vomiting'.  She was seen again in the ED on 2/17 for vomiting and responded to IM meds (she has poor IV access). She was  hypoglycemia on that date also.   On 2/20, she did not respond to usual meds given in the ED. Reglan did not help the abdominal pain but did reduce the vomiting.   Low blood sugars required D50.  A CXR showed mild worsening of CHF and she had stable moderate bilateral pleural effusions and diffuse body anasarca.  Two small enhancing masses were noted in the upper pole of right kidney (suspicious for small renal cell carcinomas).  She was treated for hyperkalemia and had dialysis here on 2/20.  She started complaining of shortness of breath on 2/21 following dialysis (where 3 L had been removed) and was placed on continuous BIPAP and moved to the ICU.  She developed a fever of 102.7 degrees and her mentation started to decline so that she was not following commands.  She was intubated on 2/22 for airway protection.  She had copious dried secretions within the oral cavity as well as possibly some old food when intubated.  Right after intubation, dark secretions were suctioned from the ET tube. IV ABX were underway for suspected aspiration pneumonia.   She had to be given continuous D10 to keep her glucose levels from being low.  She was hypotensive due to septic shock.    An endocrinology consult from Robin. Graceann [name] was obtained on 2/22 regarding her hypoglycemia.  The hypoglycemia was thought to be due  to infection, but adrenal insufficiency was to be kept in the differential.  IV hydrocortisone 50 mg q 8hrs was ordered empirically and D10 was continued.  Central line placement was difficult but eventually achieved and pressors were started.  Plavix was continued. She was started on Tube Feedings.  Sputum cultures grew streptococcus and enterobacter.   She was extubated on 2/26.    She received hydrocortisone from 2/22 -2/26, when it was [facility] b/c she had gone from being hypOglycemic to hypERglycemic.  Since it was [facility], she is now again hypoglycemic very often.  Cough effort was noted as fair.  Mentation was  poor.  Post extubation dysphagia was noted. On 2/27, she started following simple commands but speech was difficult to understand.  She was able to communicate that she does not like Nepro supplement as it causes her to have diarrhea.  SLP eval on 2/27 rec: dysphagia 1 with Nectar liquids.  She was able to start sitting up in a chair.  PT recommended SNF rehab.  She complained of shortness of breath on 2/28 but sats were good. Thrombocytopenia improved.  Right arm edema was noted but a DVT there was ruled out.  A chest Ct angio did not show any acute PEs but pleural effusions were found to be LARGE and there was still multifocal pneumonia.  C diff toxins were indeterminate but PCR was negative negative.  On 3/2, she had an MBSS and SLP rec: Dysphagia 2 (fine chop) solids with nectar thick. (there was aspiration of thin liquids and ENT eval was advised).  Blood sugars have often been low since hydrocortisone was DCd.   She has recurring complaints of chest pain and shoulder pain.  Infectious Disease recommended 7 days treatment with Levaquin for the gram neg pneumonia (on 3/6). She had only had 4 days at that time.    Left Thoracentesis was performed on 3/8 and 950 ml of fluid was removed.  She had 13,743 WBCs in this sample as well as an LD high at 104 (no serum LDH yet available).  ID has mentioned that pt has had prior thoracentesis procedures and pulmonary might need to be reconsulted as to the etiology of these.    A Urology consult today (3/9) stated that the renal masses have not changed in size since imaging done on 02/22/15 and that is reassuring.  The recommendation is for the pt to have these masses re-imaged in 6 months by CT with and without contrast.  (per [name]: (In general, a solid renal mass raises the suspicion of primary renal malignancy, approximately 80 % chance based on radiographic characteristics. The risk of metastasis increases as the size of solid renal mass  increases. In general, it is believed that the risk of metastasis for renal masses less than 3-4 cm is small (up to approximately 5%) based mainly on large retrospective studies. Additionally, the growth rate of small renal masses tends to be on the order of millimeters/ annually. In some cases and especially in patients of older age and multiple comorbidities a surveillance approach may be appropriate).  ------------------------------------------------------------------- IMPRESSION: Septic Shock due to pneumonia Bilateral aspiration pneumonia --with acute hypoxic respiratory failure and intubation on 2/22 ESRD (HD here M-W-F) ---with anasarca  ---scant urine produced  CAD  ---with h/o MI, stents DM2  Gastroparesis ---with h/o several hospital stays for vomiting and abd pain associated with this diagnosis. COPD Collagen Vascular Disease ---I have no details on this at all --it is just  listed in her history GERD Prior Pancreatitis Episodes H/O Pneumonia Chronic Diastolic CHF Anxiety and Depression DJD Chronic Headaches Anemia of Chronic  Renal Disease HTN Hypokalemia Somatic Symptom Disorder Secondary Hyperparathyroidism Pleural Effusions ---had thoracentesis on 3/8 with 950 ml fluid removed that had high WBC count H/O cholecystectomy Hypoglycemia --recurrent ---due to malnourrishment and poor glycogen stores ---Or possibly due to adrenal insufficiency or other cause ---she was treated from 2/22 until 2/26 with hydrocortisone empirically for possible adrenal insufficiency and her glucose numbers started running high and so this was stopped. Since stopped, she is again running hypoglycemic often.  Past h/o C Diff Colitis ---treated in past with fecal transplant Multiple Drug Allergies Cardiomegaly and cardiomyopathy  ---Echo 09/2015 = EF 40% with L vent dysfunction and hypertrophy and systolic and diastolic grade 1 dysfunction.  Also with Mod MR and SEVERE TR and mod pleural  effusions. Weakness and poor endurance ---PT has recommended SNF / rehab Malnutrition due to poor appetite, chronic diseases, and cyclic vomiting Recurrent chest pain and chest wall tenderness Electrolyte Dissaray  Episodes of Hyperglycemia Metabolic encephalopathy (with hypophosphatemia) Pressure Ulcer Bronchiectasis   REVIEW OF SYSTEMS:  Patient is not able to provide ROS due to illness.  Used a walker at home.  Had [facility] and PT at home.   SPIRITUAL SUPPORT SYSTEM: Yes.  SOCIAL HISTORY:  reports that she has been smoking Cigarettes.  She has been smoking about 0.50 packs per day. She has never used smokeless tobacco. She reports that she does not drink alcohol or use illicit drugs.  LEGAL DOCUMENTS:  None  CODE STATUS: Full code  PAST MEDICAL HISTORY: Past Medical History  Diagnosis Date  . Asthma   . Collagen vascular disease ([place])   . Diabetes mellitus without complication ([place])   . Hypertension   . Coronary artery disease     a. cath 2013: stenting to RCA (report not available); b. cath 2014: LM nl, pLAD 40%, mLAD nl, ost LCx 40%, mid LCx nl, pRCA 30% @ site of prior stent, mRCA 50%  . Diabetic neuropathy ([place])   . ESRD (end stage renal disease) on dialysis ([place])     M-W-F  . GERD (gastroesophageal reflux disease)   . Hx of pancreatitis 2015  . Myocardial infarction ([place])   . Pneumonia   . Mitral regurgitation     a. echo 10/2013: EF 62%, noWMA, mildly dilated LA, mild to mod MR/TR, GR1DD  . COPD (chronic obstructive pulmonary disease) ([place])   . chronic diastolic CHF [id number]  . dialysis 2006  . Depression   . Anxiety   . Arthritis   . Headache   . Anemia   . Renal insufficiency     PAST SURGICAL HISTORY:  Past Surgical History  Procedure Laterality Date  . Cholecystectomy    . Appendectomy    . Abdominal hysterectomy    . Dialysis fistula creation Left     upper arm  . Esophagogastroduodenoscopy N/A 03/08/2015    Procedure:  ESOPHAGOGASTRODUODENOSCOPY (EGD);  Surgeon: Manya Silvas, MD;  Location: [facility] ENDOSCOPY;  Service: Endoscopy;  Laterality: N/A;  . Cardiac catheterization Left 07/26/2015    Procedure: Left Heart Cath and Coronary Angiography;  Surgeon: Dionisio David, MD;  Location: [place] CV LAB;  Service: Cardiovascular;  Laterality: Left;  Marland Kitchen Eye surgery    . Fecal transplant N/A 08/23/2015    Procedure: FECAL TRANSPLANT;  Surgeon: Manya Silvas, MD;  Location: [facility] ENDOSCOPY;  Service: Endoscopy;  Laterality: N/A;    ALLERGIES:  is allergic to compazine; ace inhibitors; ativan; codeine; gabapentin; losartan; ondansetron; prochlorperazine; scopolamine; zofran; oxycodone; and tape.  MEDICATIONS:  Current Facility-Administered Medications  Medication Dose Route Frequency Provider Last Rate Last Dose  . acetaminophen (TYLENOL) tablet 650 mg  650 mg Oral Q6H PRN Bettey Costa, MD   650 mg at 11/29/15 1338   Or  . acetaminophen (TYLENOL) suppository 650 mg  650 mg Rectal Q6H PRN Bettey Costa, MD   650 mg at 11/13/15 2042  . acidophilus (RISAQUAD) capsule 2 capsule  2 capsule Oral TID Epifanio Lesches, MD   2 capsule at 11/29/15 1027  . albuterol (PROVENTIL) (2.5 MG/3ML) 0.083% nebulizer solution 2.5 mg  2.5 mg Nebulization Q4H PRN Bettey Costa, MD   2.5 mg at 11/24/15 1044  . alum & mag hydroxide-simeth (MAALOX/MYLANTA) 200-200-20 MG/5ML suspension 30 mL  30 mL Per Tube Q6H PRN Mikael Spray, NP   30 mL at 11/24/15 0835  . amiodarone (PACERONE) tablet 200 mg  200 mg Oral Daily Wilhelmina Mcardle, MD   200 mg at 11/29/15 1029  . atorvastatin (LIPITOR) tablet 20 mg  20 mg Oral QPM Wilhelmina Mcardle, MD   20 mg at 11/28/15 1836  . bacitracin ointment   Topical BID Bettey Costa, MD   [id number] application at [id number]  . bisacodyl (DULCOLAX) EC tablet 5 mg  5 mg Oral Daily PRN Bettey Costa, MD      . chlorhexidine gluconate (PERIDEX) 0.12 % solution 15 mL  15 mL Mouth Rinse BID Laverle Hobby, MD   15 mL  at 11/29/15 0857  . cholecalciferol (VITAMIN D) tablet 400 Units  400 Units Oral Daily [name], MD   400 Units at 11/29/15 1027  . citalopram (CELEXA) tablet 20 mg  20 mg Oral Daily Wilhelmina Mcardle, MD   20 mg at 11/29/15 1028  . clopidogrel (PLAVIX) tablet 75 mg  75 mg Oral Daily Wilhelmina Mcardle, MD   75 mg at 11/29/15 1028  . dextrose 50 % solution 25 g  25 g Intravenous Q30 min PRN Laverle Hobby, MD   25 g at 11/27/15 0805  . guaiFENesin (MUCINEX) 12 hr tablet 600 mg  600 mg Oral BID Dustin Flock, MD   600 mg at 11/29/15 1028  . HYDROcodone-acetaminophen (NORCO/VICODIN) 5-325 MG per tablet 1 tablet  1 tablet Oral Q6H PRN [name], MD   1 tablet at 11/29/15 1046  . insulin aspart (novoLOG) injection 0-5 Units  0-5 Units Subcutaneous QHS Bettey Costa, MD   0 Units at 11/22/15 2200  . insulin aspart (novoLOG) injection 0-9 Units  0-9 Units Subcutaneous TID WC Bettey Costa, MD   2 Units at 11/27/15 1810  . iohexol (OMNIPAQUE) 240 MG/ML injection 25 mL  25 mL Oral Once PRN Loney Hering, MD      . levofloxacin [facility]) IVPB 500 mg  500 mg Intravenous Q48H Adrian Prows, MD   500 mg at 11/28/15 1836  . lipase/protease/amylase (CREON) capsule 12,000 Units  12,000 Units Oral TID WC [name], MD   12,000 Units at 11/29/15 1206  . nitroGLYCERIN (NITROSTAT) SL tablet 0.4 mg  0.4 mg Sublingual Q5 Min x 3 PRN Sital Mody, MD      . pantoprazole (PROTONIX) EC tablet 40 mg  40 mg Oral Q1200 Wilhelmina Mcardle, MD   40 mg at 11/29/15 1206  . senna-docusate (Senokot-S) tablet 1 tablet  1 tablet Oral QHS PRN  Mikael Spray, NP      . vitamin B-12 (CYANOCOBALAMIN) tablet 1,000 mcg  1,000 mcg Oral Daily Wilhelmina Mcardle, MD   1,000 mcg at 11/29/15 1028    Vital Signs: BP 141/50 mmHg  Pulse 70  Temp(Src) 97.7 F (36.5 C) (Oral)  Resp 16  Ht 5\' 5"  (1.651 m)  Wt 66.8 kg (147 lb 4.3 oz)  BMI 24.51 kg/m2  SpO2 99% Filed Weights   11/26/15 1330 11/28/15 1415 11/28/15 1732  Weight:  66.5 kg (146 lb 9.7 oz) 67.8 kg (149 lb 7.6 oz) 66.8 kg (147 lb 4.3 oz)    Estimated body mass index is 24.51 kg/(m^2) as calculated from the following:   Height as of this encounter: 5\' 5"  (1.651 m).   Weight as of this encounter: 66.8 kg (147 lb 4.3 oz).  PERFORMANCE STATUS (ECOG) : 4 - Bedbound  PHYSICAL EXAM:        LABS: CBC:    Component Value Date/Time   WBC 5.9 11/26/2015 0457   WBC 10.0 09/15/2014 0948   HGB 9.1* 11/26/2015 0457   HGB 10.5* 09/15/2014 0948   HCT 28.7* 11/26/2015 0457   HCT 34.2* 09/15/2014 0948   PLT 234 11/26/2015 0457   PLT 203 09/15/2014 0948   MCV 85.2 11/26/2015 0457   MCV 97 09/15/2014 0948   NEUTROABS 3.3 11/26/2015 0457   NEUTROABS 1.7 05/31/2014 0432   LYMPHSABS 1.3 11/26/2015 0457   LYMPHSABS 2.5 05/31/2014 0432   MONOABS 0.9 11/26/2015 0457   MONOABS 0.5 05/31/2014 0432   EOSABS 0.3 11/26/2015 0457   EOSABS 0.2 05/31/2014 0432   BASOSABS 0.1 11/26/2015 0457   BASOSABS 0.0 05/31/2014 0432   Comprehensive Metabolic Panel:    Component Value Date/Time   NA 138 11/29/2015 0431   NA 135* 09/15/2014 0948   K 4.3 11/29/2015 0431   K 4.8 09/15/2014 0948   CL 98* 11/29/2015 0431   CL 99 09/15/2014 0948   CO2 36* 11/29/2015 0431   CO2 26 09/15/2014 0948   BUN 10 11/29/2015 0431   BUN 19* 09/15/2014 0948   CREATININE 2.49* 11/29/2015 0431   CREATININE 6.79* 09/15/2014 0948   GLUCOSE 59* 11/29/2015 0431   GLUCOSE 118* 09/15/2014 0948   CALCIUM 8.0* 11/29/2015 0431   CALCIUM 8.3* 09/15/2014 0948   AST 20 11/13/2015 1425   AST 43* 09/15/2014 0948   ALT 11* 11/13/2015 1425   ALT 16 09/15/2014 0948   ALKPHOS 144* 11/13/2015 1425   ALKPHOS 105 09/15/2014 0948   BILITOT 1.1 11/13/2015 1425   BILITOT 0.5 09/15/2014 0948   PROT 7.4 11/13/2015 1425   PROT 7.2 09/15/2014 0948   ALBUMIN 2.0* 11/20/2015 0547   ALBUMIN 3.0* 09/15/2014 0948    More than 50% of the visit was spent in counseling/coordination of care: Yes  Time  Spent: 80 minutes

## 2015-11-29 NOTE — Progress Notes (Signed)
Physical Therapy Treatment Patient Details Name: [name] MRN: [id number] DOB: [birth year] Today's Date: 11/29/2015    History of Present Illness Pt is a 55 y.o. female with a known history of ESRD, CAD and Asacol failure who presents with abdominal pain, nausea and vomiting for the past day. Patient is also complaining of shortness of breath. Patient did not go for dialysis today because of abdominal pain. CT of the abdomen ordered in the emergency room showed no acute pathology. Patient reports that her granddaughters have been sick. Pt is AOx3 at time of evaluation but disoriented to situation. Poor short term memory and unable to provide extensive details regarding prior function a living situation. Significant portion of history obtained from medical record    PT Comments    Pt was lethargic at the beginning of the session but was awake/alert at the end of the session.  Pt was CGA for rolling, min assist for B LE for supine to sit and CGA for sit to supine.  Pt was CGA to min assist for 3 sets of sit to stand with RW (2 sets from bedside commode, one set sitting EOB).  Pt was CGA for stand pivot transfer.  Pt required intermittent VC's and tactile cues for stand pivot transfer.  Pt required extra time for all mobility during session.  Session was modified due to decreased activity tolerance.  Pt reported pain in chest with breathing sitting EOB.  Nursing was notified. Next session PT will attempt ambulation and to improve activity tolerance.   Follow Up Recommendations  SNF     Equipment Recommendations  Rolling walker with 5" wheels    Recommendations for Other Services       Precautions / Restrictions Precautions Precautions: Fall Restrictions Weight Bearing Restrictions: No    Mobility  Bed Mobility Overal bed mobility: Needs Assistance Bed Mobility: Supine to Sit;Sit to Supine;Rolling Rolling: Min guard   Supine to sit: Min assist (B LE navigation) Sit to supine: Min  guard   General bed mobility comments: required extra time   Transfers Overall transfer level: Needs assistance Equipment used: Rolling walker (2 wheeled) Transfers: Sit to/from [facility] Sit to Stand: Min guard;Min assist (3 sets, one sitting EOB, 2 from bedside commode ) Stand pivot transfers: Min guard       General transfer comment: required extra time and VC and tactile cues for foot and hand placment.  Pt complained of pain in B shins upon standing.    Ambulation/Gait             General Gait Details: was not attempted    Stairs            Wheelchair Mobility    Modified Rankin (Stroke Patients Only)       Balance Overall balance assessment: Needs assistance Sitting-balance support: Feet supported Sitting balance-Leahy Scale: Fair     Standing balance support: Bilateral upper extremity supported (RW) Standing balance-Leahy Scale: Fair                      Cognition Arousal/Alertness: Awake/alert Behavior During Therapy: Flat affect Overall Cognitive Status: No family/caregiver present to determine baseline cognitive functioning                      Exercises      General Comments  Nursing was contacted and cleared pt for physical therapy.  Pt was agreeable and session was modified due to pain and fatigue.  Nursing student and instructor were present during session in order to administer pain meds and assisted in bed side commode transfers.  Nursing was notified of pain in chest when breathing sitting EOB.       Pertinent Vitals/Pain Pain Assessment: 0-10 Pain Score: 6  Pain Location: Anterior chest, L upper thorax and B anterior shins Pain Descriptors / Indicators: Aching;Burning;Discomfort;Grimacing Pain Intervention(s): Limited activity within patient's tolerance;Monitored during session;Repositioned;RN gave pain meds during session  See flow sheet for vitals.     Home Living                       Prior Function            PT Goals (current goals can now be found in the care plan section) Acute Rehab PT Goals Patient Stated Goal: To be able to stand and walk  PT Goal Formulation: With patient Time For Goal Achievement: 12/11/15 Potential to Achieve Goals: Fair Progress towards PT goals: Progressing toward goals    Frequency  Min 2X/week    PT Plan Current plan remains appropriate    Co-evaluation             End of Session Equipment Utilized During Treatment: Gait belt;Oxygen Activity Tolerance: Patient limited by fatigue;Patient limited by pain Patient left: in bed;with call bell/phone within reach;with bed alarm set     Time: [id number] PT Time Calculation (min) (ACUTE ONLY): 40 min  Charges:                       G Codes:      Mittie Bodo, SPT Mittie Bodo 11/29/2015, 12:42 PM

## 2015-11-30 LAB — GLUCOSE, CAPILLARY
Glucose-Capillary: 112 mg/dL — ABNORMAL HIGH (ref 65–99)
Glucose-Capillary: 53 mg/dL — ABNORMAL LOW (ref 65–99)
Glucose-Capillary: 57 mg/dL — ABNORMAL LOW (ref 65–99)
Glucose-Capillary: 120 mg/dL — ABNORMAL HIGH (ref 65–99)
Glucose-Capillary: 54 mg/dL — ABNORMAL LOW (ref 65–99)
Glucose-Capillary: 129 mg/dL — ABNORMAL HIGH (ref 65–99)
Glucose-Capillary: 116 mg/dL — ABNORMAL HIGH (ref 65–99)
Glucose-Capillary: 124 mg/dL — ABNORMAL HIGH (ref 65–99)

## 2015-11-30 LAB — CBC
HCT: 27 % — ABNORMAL LOW (ref 35.0–47.0)
Hemoglobin: 8.7 g/dL — ABNORMAL LOW (ref 12.0–16.0)
MCH: 26.6 pg (ref 26.0–34.0)
MCHC: 32.1 g/dL (ref 32.0–36.0)
MCV: 82.7 fL (ref 80.0–100.0)
Platelets: 168 10*3/uL (ref 150–440)
RBC: 3.27 MIL/uL — ABNORMAL LOW (ref 3.80–5.20)
RDW: 20.1 % — ABNORMAL HIGH (ref 11.5–14.5)
WBC: 5.3 10*3/uL (ref 3.6–11.0)

## 2015-11-30 LAB — RENAL FUNCTION PANEL
Albumin: 1.9 g/dL — ABNORMAL LOW (ref 3.5–5.0)
Anion gap: 8 (ref 5–15)
BUN: 19 mg/dL (ref 6–20)
CO2: 30 mmol/L (ref 22–32)
Calcium: 8.1 mg/dL — ABNORMAL LOW (ref 8.9–10.3)
Chloride: 93 mmol/L — ABNORMAL LOW (ref 101–111)
Creatinine, Ser: 3.64 mg/dL — ABNORMAL HIGH (ref 0.44–1.00)
GFR calc Af Amer: 15 mL/min — ABNORMAL LOW (ref >60)
GFR calc non Af Amer: 13 mL/min — ABNORMAL LOW (ref >60)
Glucose, Bld: 141 mg/dL — ABNORMAL HIGH (ref 65–99)
Phosphorus: 6 mg/dL — ABNORMAL HIGH (ref 2.5–4.6)
Potassium: 4.8 mmol/L (ref 3.5–5.1)
Sodium: 131 mmol/L — ABNORMAL LOW (ref 135–145)

## 2015-11-30 LAB — CYTOLOGY - NON PAP

## 2015-11-30 MED ORDER — DEXTROSE 50 % IV SOLN
25.0000 mL | Freq: Once | INTRAVENOUS | Status: AC
  Administered 2015-11-30: 25 mL via INTRAVENOUS

## 2015-11-30 NOTE — Progress Notes (Signed)
[name] MRN: [id number] DOB: November 30, 1960   Cancelled Treatment:    Reason Eval/Treat Not Completed: Patient at procedure or test/unavailable. Pt currently off floor at dialysis.  Will re-attempt PT at a later date/time.   [name] Amato [place] 11/30/2015, 10:06 AM [name], [place]

## 2015-11-30 NOTE — Progress Notes (Signed)
PULMONARY / CRITICAL CARE MEDICINE   Name: [name] MRN: [id number] DOB: Oct 28, 1960    ADMISSION DATE:  11/12/2015   CONSULTATION DATE:  11/14/15  REFERRING MD:  Dr Vianne Bulls  CHIEF COMPLAINT: acute SOB, effusion  HISTORY OF PRESENT ILLNESS:   This is a 55 yo AA female with a PMH of ESRD on dialysis, type 2 DM, hypertension, COPD, CHF and CAD admitted with acute SOB, patient with acute SOB, s/p thoracentesis 950 Ml removed vis US guided thoracentesis.  Patient with LDH 104, WBC 13000 63% lymphs, cultures were not taken Her CXR showed bilateral pleural effusions and bilateral lower lobe opacities.  Patient has no fevers, feels much better since thoracentesis On minimal oxygen therapy    VITAL SIGNS: BP 160/59 mmHg  Pulse 66  Temp(Src) 97.6 F (36.4 C) (Oral)  Resp 16  Ht 5\' 5"  (1.651 m)  Wt 145 lb 11.6 oz (66.1 kg)  BMI 24.25 kg/m2  SpO2 100%  HEMODYNAMICS:   VENTILATOR SETTINGS:    INTAKE / OUTPUT: I/O last 3 completed shifts: In: 72 [P.O.:960] Out: 0   Review of Systems  Constitutional: Negative for fever and chills.  Eyes: Negative for blurred vision.  Respiratory: Negative for cough, hemoptysis, sputum production, shortness of breath and wheezing.   Cardiovascular: Negative for chest pain and palpitations.  Gastrointestinal: Negative for nausea and vomiting.  Musculoskeletal: Negative for myalgias.  Neurological: Negative for dizziness and headaches.  All other systems reviewed and are negative.   PHYSICAL EXAMINATION: General: alert and awake Neuro: no focal deficits HEENT: WNL Cardiovascular:  RRR, no M Lungs: clear anteriorly. Diminished BS in R base Abdomen: Soft, NT/ND, normal bowel sounds Ext: no edema  BMET  Recent Labs Lab 11/26/15 0457 11/29/15 0431 11/29/15 2049  NA 137 138 134*  K 4.2 4.3 4.8  CL 97* 98* 95*  CO2 33* 36* 33*  BUN 13 10 15   CREATININE 3.07* 2.49* 3.18*  GLUCOSE 62* 59* 71    Electrolytes  Recent Labs Lab  11/26/15 0457 11/29/15 0431 11/29/15 2049  CALCIUM 8.1* 8.0* 8.1*    CBC  Recent Labs Lab 11/26/15 0457 11/30/15 0941  WBC 5.9 5.3  HGB 9.1* 8.7*  HCT 28.7* 27.0*  PLT 234 168    Liver Enzymes  Recent Labs Lab 11/29/15 2049  AST 17  ALT 12*  ALKPHOS 182*  BILITOT 0.9  ALBUMIN 1.9*     Recent Labs Lab 11/29/15 2247 11/30/15 0144 11/30/15 0753 11/30/15 0823 11/30/15 0835 11/30/15 0858  GLUCAP 69 112* 53* 79* 19* 120*    CXR: Cardiomegaly, mild interstitial edema with bilateral effusions     PULMONARY A:hypoxia and SOB from bilateral effusion from CHronic diastolic heart failure and ESRD -optimize cardiac meds and cont HD as needed -s/p thoracentesis tolerated well No there recommendations at this time     The Patient requires high complexity decision making for assessment and support, frequent evaluation and titration of therapies.  Patient are satisfied with Plan of action and management. All questions answered  Corrin Parker, M.D.  [facility]  Medical Director [place] Director [facility] Cardio-Pulmonary Department     .

## 2015-11-30 NOTE — Progress Notes (Signed)
Nutrition Follow-up  DOCUMENTATION CODES:   Non-severe (moderate) malnutrition in context of acute illness/injury  INTERVENTION:  Meals and snacks: monitor intake and cater to pt preferences within diet restrictions Medical Nutrition Supplement thearpy: pt does not like nepro but will continue mightyshake and magic cup Coordination of care: If aggressive care is wanted, pt may benefit from enteral nutrition support   NUTRITION DIAGNOSIS:   Inadequate oral intake related to altered GI function as evidenced by per patient/family report.    GOAL:   Patient will meet greater than or equal to 90% of their needs  Not meeting nutritional needs  MONITOR:    (Energy intake, Digestive system)  REASON FOR ASSESSMENT:   Consult Enteral/tube feeding initiation and management  ASSESSMENT:      Noted possible discharge to SNF tomorrow   Current Nutrition: Calorie count results for 3/7 (dinner meal) nectar milk consumed (180 kcals and 80 gm of protein) 3/9 ticket reviewed nothing consumed 3/10 ticket reviewed for breakfast and juice and milk consumed (340 kcals and 80 gm of protein) Limited intake for > 7 days while on po diet  Pt sleeping during visit this pm   Gastrointestinal Profile: Last BM: 3/9   Scheduled Medications:  . acidophilus  2 capsule Oral TID  . amiodarone  200 mg Oral Daily  . atorvastatin  20 mg Oral QPM  . bacitracin   Topical BID  . chlorhexidine gluconate  15 mL Mouth Rinse BID  . cholecalciferol  400 Units Oral Daily  . citalopram  20 mg Oral Daily  . clopidogrel  75 mg Oral Daily  . guaiFENesin  600 mg Oral BID  . insulin aspart  0-5 Units Subcutaneous QHS  . insulin aspart  0-9 Units Subcutaneous TID WC  . lipase/protease/amylase  12,000 Units Oral TID WC  . pantoprazole  40 mg Oral Q1200  . predniSONE  10 mg Oral Q breakfast  . vitamin B-12  1,000 mcg Oral Daily       Electrolyte/Renal Profile and Glucose Profile:   Recent Labs Lab  11/29/15 0431 11/29/15 2049 11/30/15 0941  NA 138 134* 131*  K 4.3 4.8 4.8  CL 98* 95* 93*  CO2 36* 33* 30  BUN 10 15 19   CREATININE 2.49* 3.18* 3.64*  CALCIUM 8.0* 8.1* 8.1*  PHOS  --   --  6.0*  GLUCOSE 59* 71 141*   Protein Profile:   Recent Labs Lab 11/29/15 2049 11/30/15 0941  ALBUMIN 1.9* 1.9*     Weight Trend since Admission: Filed Weights   11/28/15 1415 11/28/15 1732 11/30/15 0940  Weight: 149 lb 7.6 oz (67.8 kg) 147 lb 4.3 oz (66.8 kg) 145 lb 11.6 oz (66.1 kg)   Noted 5% wt loss in the last month   Diet Order:  DIET DYS 2 Room service appropriate?: Yes with Assist; Fluid consistency:: Nectar Thick  Skin:   (stage II pressure ulcer sacrum)   Height:   Ht Readings from Last 1 Encounters:  11/12/15 5\' 5"  (1.651 m)    Weight:   Wt Readings from Last 1 Encounters:  11/30/15 145 lb 11.6 oz (66.1 kg)    Ideal Body Weight:     BMI:  Body mass index is 24.25 kg/(m^2).  Estimated Nutritional Needs:   Kcal:  1887 kcals (Ve: 9.1, Tmax: 39.2) using wt of 73 kg  Protein:  110-146 g(1.5-2.0 g/kg)   Fluid:  (1044ml + UOP)  EDUCATION NEEDS:   No education needs identified at this time  HIGH Care Level  Anavey Coombes B. Zenia Resides, [place], [place] (pager) Weekend/On-Call pager [phone])

## 2015-11-30 NOTE — Progress Notes (Signed)
Pt CBG at 0753 was 53, one 8 fl. Oz of cranberry juice with one pack of sugar was given. Rechecked CBG at 0823 and was 57. Dr. Anselm Jungling was notified and new orders to give Dextrose 50% 25 ml given once. Will recheck CBG at 0910. Will continue to monitor pt and encourage food intake.   Angus Seller

## 2015-11-30 NOTE — Clinical Social Work Note (Signed)
Pt will discharge to [facility] when pt is stable for discharge. CSW will continue to follow.   Darden Dates, MSW, LCSW  Clinical Social Worker [phone]

## 2015-11-30 NOTE — Progress Notes (Signed)
Patient ambulated in room to the door and back to bed.  It was about 25 feet.  Tolerated fairly, just short of breath afterwards. [name]  11/30/2015  5:01 AM

## 2015-11-30 NOTE — Progress Notes (Signed)
[facility] [place] NAME: [name]    MR#:  [id number]  DATE OF BIRTH:  1961-08-19  SUBJECTIVE:   Low BS for the past 3 days. BS was 47 this am, possible due to poor appetite. On 2 L oxygen. S/p thoracentesis 11/28/15. 950 ml fluid removed. Not eating much, Blood sugar runs on lower normal side,   [name] was started on solucortef IV by endocrine for hypoglycemia- and later it was stopped as blood sugar was higher.   resume steroids.   Now off the Dextrose drip- blood sugar was again low today morning.  REVIEW OF SYSTEMS:   Constitutional: no fever or chills, but has poor appetite and oral intake, generalized weakness. HENT: Negative for sore throat.  Eyes: Negative for blurred vision, double vision and pain.  Respiratory: Negative for cough, hemoptysis, shortness of breath and wheezing.  Cardiovascular: left lower chest pain at the site of pleural tap, palpitations, orthopnea and leg swelling.  Gastrointestinal: Negative for heartburn, no abdominal pain, nausea, vomiting, diarrhea or constipation.  Genitourinary: Negative for dysuria and hematuria.  Musculoskeletal: Negative for back pain and joint pain.  Skin: Negative for rash.  Neurological: Negative for sensory change, speech change, focal weakness and headaches.    DRUG ALLERGIES:   Allergies  Allergen Reactions  . Compazine [Prochlorperazine Edisylate] Anaphylaxis and Nausea And Vomiting  . Ace Inhibitors Swelling  . Ativan [Lorazepam] Other (See Comments)    Reaction:  Hallucinations and headaches  . Codeine Nausea And Vomiting  . Gabapentin Other (See Comments)    Reaction:  Unknown   . Losartan Other (See Comments)    Reaction:  Unknown   . Ondansetron Other (See Comments)    Reaction:  Unknown   . Prochlorperazine Other (See Comments)    Reaction:  Unknown   . Scopolamine Other (See Comments)    Reaction:  Unknown   . Zofran [Ondansetron Hcl] Other  (See Comments)    Reaction:  hallucinations   . Oxycodone Anxiety  . Tape Rash    VITALS:  Blood pressure 160/59, pulse 66, temperature 97.6 F (36.4 C), temperature source Oral, resp. rate 16, height 5\' 5"  (1.651 m), weight 66.1 kg (145 lb 11.6 oz), SpO2 100 %.  PHYSICAL EXAMINATION:   Physical Exam  Constitutional: [name] is well-developed, well-nourished, and in no distress. No distress.  HENT:  Head: Normocephalic.  Nose: Nose normal.  Decreased hearing  Eyes: No scleral icterus.  Neck: No JVD present. No tracheal deviation present.  Cardiovascular: Normal rate, regular rhythm and normal heart sounds.  Exam reveals no gallop and no friction rub.   No murmur heard. Chest wall tenderness  Pulmonary/Chest: Effort normal. No respiratory distress. [name] has decreased breath sounds in the right lower field and the left lower field. [name] has no wheezes. [name] has no rales. [name] exhibits no tenderness.  Abdominal: Soft. Bowel sounds are normal. [name] exhibits no distension and no mass. There is no tenderness. There is no rebound and no guarding.  Musculoskeletal: Normal range of motion. [name] exhibits no edema.  Skin: Skin is warm. No rash noted. No erythema.    LABORATORY PANEL:   CBC  Recent Labs Lab 11/30/15 0941  WBC 5.3  HGB 8.7*  HCT 27.0*  PLT 168   ------------------------------------------------------------------------------------------------------------------  Chemistries   Recent Labs Lab 11/29/15 2049 11/30/15 0941  NA 134* 131*  K 4.8 4.8  CL 95* 93*  CO2 33* 30  GLUCOSE 71  141*  BUN 15 19  CREATININE 3.18* 3.64*  CALCIUM 8.1* 8.1*  AST 17  --   ALT 12*  --   ALKPHOS 182*  --   BILITOT 0.9  --    ------------------------------------------------------------------------------------------------------------------  Cardiac Enzymes No results for input(s): TROPONINI in the last 168  hours. ------------------------------------------------------------------------------------------------------------------  RADIOLOGY:  No results found.   ASSESSMENT AND PLAN:   55 year old female with end-stage renal disease on hemodialysis, diabetes with retinopathy, gastroparesis and neuropathy who initially presented with abdominal pain and subsequently has acute respiratory failure secondary to pneumonia.  # Bilateral pneumonia with Streptococcus and enterobacter On levaquin.  Given till 11/29/2015. Appreciate ID help. As per her fluid mainly appears lymphocytic predominance , likely it is chronic and patient also CHF so pulmonary or ID did not suggest continuing antibiotics.  # Acute respiratory failure due to pneumonia and pulmonary edema with bilateral large pleural effusions Patient was intubated and on ventilation, is status post extubation. CT scan shows no acute pulmonary emboli however did show large bilateral pleural effusions and multifocal pneumonia. Thoracentesis done- shows many WBCs- I spoke to ID and pulm for follow up. Pt is asymptomatic and no fever.  No further need for antibiotic as per pulmonary.  # Acute on chronic diastolic heart failure in the setting of ESRD - Improved Repeated CXR showed CHF. Continue dialysis.  [name] will need to follow up on the chest x-ray after a month to check for resolution of pleural effusion.  # Septic shock: Due to C. difficile and pneumonia Resolved  # Diabetic Gastroparesis Continue Phenergan and Reglan  # ESRD on hemodialysis: Continue dialysis Monday, Wednesday and Friday.  # Renal mass -Suggestive of early RCC. Urology consult appreciated , no workup at this time   #Type 2 diabetes with gastroparesis: on Sliding scale insulin.  # Hypoglycemia. Likely adrenal insufficiency.  Due to poor oral intake, encourage oral intake. Not on basal insulin. Dietitian consult.  Started D5 IV, monitor.  On dietary  supplementation.   Pt was started on solucortef on 11/14/15 by endocrine and later d/c due to hyperglycemia.   I will start on prednisone oral as [name] may have adrenal insufficiency.  # Dysphagia:  as per speech [name] has signs and symptoms of chronic aspiration   Dysphagia 2 (fine chop);Nectar-thick liquid with aspiration precautions   Management plans discussed with the patient and [name] is in agreement. Greater than 50% time was spent on coordination of care and face-to-face counseling. CODE STATUS: FULL  Prognosis  Very poor, called palliative care consult.  TOTAL TIME TAKING CARE OF THIS PATIENT: 35 minutes.   Likely discharge to skilled nursing facility  Tomorrow- as blood sugar level was low again today morning, but he received his prednisone first dose today so hopefully it will not drop down tomorrow morning.  Vaughan Basta M.D on 11/30/2015 at 3:18 PM  Between 7am to 6pm - Pager - [phone]  After 6pm go to [email or web address] - password [email or web address]  Office  [phone]  CC: Primary care physician; Ellamae Sia, MD  Note: This dictation was prepared with Dragon dictation along with smaller phrase technology. Any transcriptional errors that result from this process are unintentional.

## 2015-11-30 NOTE — Progress Notes (Signed)
Pt current CBG 120. Will continue to monitor pt and encourage food intake.   [name]

## 2015-11-30 NOTE — Progress Notes (Signed)
Subjective:   Seen and examined on hemodialysis. Tolerating treatment well. Uf goal 1  Objective:  Vital signs in last 24 hours:  Temp:  [97.6 F (36.4 C)-97.7 F (36.5 C)] 97.6 F (36.4 C) (03/10 0940) Pulse Rate:  [66-71] 66 (03/10 0824) Resp:  [16-17] 16 (03/10 0824) BP: (152-160)/(54-59) 160/59 mmHg (03/10 0824) SpO2:  [100 %] 100 % (03/10 0824) Weight:  [66.1 kg (145 lb 11.6 oz)] 66.1 kg (145 lb 11.6 oz) (03/10 0940)  Weight change:  Filed Weights   11/28/15 1415 11/28/15 1732 11/30/15 0940  Weight: 67.8 kg (149 lb 7.6 oz) 66.8 kg (147 lb 4.3 oz) 66.1 kg (145 lb 11.6 oz)    Intake/Output:    Intake/Output Summary (Last 24 hours) at 11/30/15 1148 Last data filed at 11/30/15 0900  Gross per 24 hour  Intake    720 ml  Output      0 ml  Net    720 ml     Physical Exam: General: No acute distress  HEENT Anicteric, adentulous  Neck supple  Pulm/lungs Clear , 2 L [place] O2  CV S1S2 no rubs  Abdomen:  Soft, Non tender, BS present  Extremities: Trace b/l LE edema  Neurologic: Awake, alert, follows commands  Skin: No acute rashes  Access: LUE AVF       Basic Metabolic Panel:   Recent Labs Lab 11/26/15 0457 11/29/15 0431 11/29/15 2049 11/30/15 0941  NA 137 138 134* 131*  K 4.2 4.3 4.8 4.8  CL 97* 98* 95* 93*  CO2 33* 36* 33* 30  GLUCOSE 62* 59* 71 141*  BUN 13 10 15 19   CREATININE 3.07* 2.49* 3.18* 3.64*  CALCIUM 8.1* 8.0* 8.1* 8.1*  PHOS  --   --   --  6.0*     CBC:  Recent Labs Lab 11/26/15 0457 11/30/15 0941  WBC 5.9 5.3  NEUTROABS 3.3  --   HGB 9.1* 8.7*  HCT 28.7* 27.0*  MCV 85.2 82.7  PLT 234 168      Microbiology:  Recent Results (from the past 720 hour(s))  MRSA PCR Screening     Status: None   Collection Time: 11/13/15  9:46 PM  Result Value Ref Range Status   MRSA by PCR NEGATIVE NEGATIVE Final    Comment:        The GeneXpert MRSA Assay (FDA approved for NASAL specimens only), is one component of a comprehensive MRSA  colonization surveillance program. It is not intended to diagnose MRSA infection nor to guide or monitor treatment for MRSA infections.   Culture, blood (Routine X 2) w Reflex to ID Panel     Status: None   Collection Time: 11/14/15 12:53 AM  Result Value Ref Range Status   Specimen Description BLOOD RIGHT HAND  Final   Special Requests BOTTLES DRAWN AEROBIC AND ANAEROBIC 5ML  Final   Culture NO GROWTH 6 DAYS  Final   Report Status 11/20/2015 FINAL  Final  Culture, blood (Routine X 2) w Reflex to ID Panel     Status: None   Collection Time: 11/14/15  7:14 AM  Result Value Ref Range Status   Specimen Description BLOOD RIGHT HAND  Final   Special Requests   Final    BOTTLES DRAWN AEROBIC AND ANAEROBIC AER 4ML ANA 2ML   Culture NO GROWTH 6 DAYS  Final   Report Status 11/20/2015 FINAL  Final  Urine culture     Status: None   Collection Time: 11/14/15 10:16  AM  Result Value Ref Range Status   Specimen Description URINE, RANDOM  Final   Special Requests NONE  Final   Culture NO GROWTH 1 DAY  Final   Report Status 11/15/2015 FINAL  Final  Culture, expectorated sputum-assessment     Status: None   Collection Time: 11/14/15  4:20 PM  Result Value Ref Range Status   Specimen Description SPU  Final   Special Requests NONE  Final   Sputum evaluation THIS SPECIMEN IS ACCEPTABLE FOR SPUTUM CULTURE  Final   Report Status 11/14/2015 FINAL  Final  Culture, respiratory (NON-Expectorated)     Status: None   Collection Time: 11/14/15  4:20 PM  Result Value Ref Range Status   Specimen Description SPU  Final   Special Requests NONE Reflexed from [id number]  Final   Gram Stain   Final    MANY WBC SEEN NO SQUAMOUS EPITHELIAL CELLS PRESENT FEW GRAM POSITIVE COCCI EXCELLENT SPECIMEN - 90-100% WBCS    Culture   Final    LIGHT GROWTH ENTEROBACTER CLOACAE MODERATE GROWTH STREPTOCOCCUS PNEUMONIAE    Report Status 11/18/2015 FINAL  Final   Organism ID, Bacteria ENTEROBACTER CLOACAE  Final    Organism ID, Bacteria STREPTOCOCCUS PNEUMONIAE  Final      Susceptibility   Streptococcus pneumoniae - MIC*    ERYTHROMYCIN <=0.12 SENSITIVE Sensitive     LEVOFLOXACIN 1 SENSITIVE Sensitive     VANCOMYCIN 0.5 SENSITIVE Sensitive     PENICILLIN <=0.06 SENSITIVE Sensitive     CEFTRIAXONE <=0.12 SENSITIVE Sensitive     * MODERATE GROWTH STREPTOCOCCUS PNEUMONIAE   Enterobacter cloacae - MIC*    PIP/TAZO Value in next row Sensitive      SENSITIVE8    CEFAZOLIN Value in next row Resistant      RESISTANT>=64    CEFEPIME Value in next row Sensitive      SENSITIVE<=1    CEFTAZIDIME Value in next row Sensitive      SENSITIVE<=1    CEFTRIAXONE Value in next row Sensitive      SENSITIVE<=1    CIPROFLOXACIN Value in next row Sensitive      SENSITIVE<=0.25    GENTAMICIN Value in next row Sensitive      SENSITIVE<=1    IMIPENEM Value in next row Sensitive      SENSITIVE1    TRIMETH/SULFA Value in next row Sensitive      SENSITIVE<=20    * LIGHT GROWTH ENTEROBACTER CLOACAE  C difficile quick scan w PCR reflex     Status: Abnormal   Collection Time: 11/15/15  7:36 AM  Result Value Ref Range Status   C Diff antigen POSITIVE (A) NEGATIVE Final   C Diff toxin NEGATIVE NEGATIVE Final   C Diff interpretation   Final    Negative for toxigenic C. difficile. Toxin gene and active toxin production not detected. May be a nontoxigenic strain of C. difficile bacteria present, lacking the ability to produce toxin.  Clostridium Difficile by PCR     Status: None   Collection Time: 11/15/15  7:36 AM  Result Value Ref Range Status   Toxigenic C Difficile by pcr NEGATIVE NEGATIVE Final    Coagulation Studies: No results for input(s): LABPROT, INR in the last 72 hours.  Urinalysis: No results for input(s): COLORURINE, LABSPEC, PHURINE, GLUCOSEU, HGBUR, BILIRUBINUR, KETONESUR, PROTEINUR, UROBILINOGEN, NITRITE, LEUKOCYTESUR in the last 72 hours.  Invalid input(s): APPERANCEUR    Imaging: No results  found.   Medications:     . acidophilus  2 capsule Oral TID  . amiodarone  200 mg Oral Daily  . atorvastatin  20 mg Oral QPM  . bacitracin   Topical BID  . chlorhexidine gluconate  15 mL Mouth Rinse BID  . cholecalciferol  400 Units Oral Daily  . citalopram  20 mg Oral Daily  . clopidogrel  75 mg Oral Daily  . guaiFENesin  600 mg Oral BID  . insulin aspart  0-5 Units Subcutaneous QHS  . insulin aspart  0-9 Units Subcutaneous TID WC  . levofloxacin (LEVAQUIN) IV  500 mg Intravenous Q48H  . lipase/protease/amylase  12,000 Units Oral TID WC  . pantoprazole  40 mg Oral Q1200  . predniSONE  10 mg Oral Q breakfast  . vitamin B-12  1,000 mcg Oral Daily   acetaminophen **OR** acetaminophen, albuterol, alum & mag hydroxide-simeth, bisacodyl, dextrose, HYDROcodone-acetaminophen, iohexol, nitroGLYCERIN, senna-docusate  Assessment/ Plan:  55 y.o. 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   [facility] MWF [place]  1. End Stage Renal Disease: MWF.  - Continue MWF schedule. Tolerating treatment well.   2. Anemia of chronic kidney disease: hemoglobin 8.7 -  holding off on epogen given right renal masses x 2. Discontinued micera as an outpatient.   3.  Acute resp failure: now 2 litres [place]. Secondary to combination of Pneumonia, Anasarca, large pleural effusions. Status post thoracentesis 949mL.  - levofloxacin and prednisone.    4.  Secondary Hyperparathyroidism: PTH low at 111. Phosphorus at low.   5. Renal mass x 2.  Two small enhancing masses in upper pole of right kidney, suspicious for small renal cell carcinomas seen on abdominal CT with IV contrast on February 20 - Appreciate urology input.       LOS:  Lavonia Dana 3/10/201711:48 AM

## 2015-12-01 DIAGNOSIS — E440 Moderate protein-calorie malnutrition: Secondary | ICD-10-CM | POA: Insufficient documentation

## 2015-12-01 LAB — BASIC METABOLIC PANEL
Anion gap: 4 — ABNORMAL LOW (ref 5–15)
BUN: 14 mg/dL (ref 6–20)
CO2: 34 mmol/L — ABNORMAL HIGH (ref 22–32)
Calcium: 8.1 mg/dL — ABNORMAL LOW (ref 8.9–10.3)
Chloride: 96 mmol/L — ABNORMAL LOW (ref 101–111)
Creatinine, Ser: 2.84 mg/dL — ABNORMAL HIGH (ref 0.44–1.00)
GFR calc Af Amer: 20 mL/min — ABNORMAL LOW (ref >60)
GFR calc non Af Amer: 17 mL/min — ABNORMAL LOW (ref >60)
Glucose, Bld: 74 mg/dL (ref 65–99)
Potassium: 4.6 mmol/L (ref 3.5–5.1)
Sodium: 134 mmol/L — ABNORMAL LOW (ref 135–145)

## 2015-12-01 LAB — GLUCOSE, CAPILLARY
Glucose-Capillary: 58 mg/dL — ABNORMAL LOW (ref 65–99)
Glucose-Capillary: 64 mg/dL — ABNORMAL LOW (ref 65–99)
Glucose-Capillary: 95 mg/dL (ref 65–99)

## 2015-12-01 MED ORDER — PREDNISONE 10 MG PO TABS: 10.0000 mg | ORAL_TABLET | Freq: Every day | ORAL | Status: DC

## 2015-12-01 MED ORDER — AMIODARONE HCL 200 MG PO TABS
200.0000 mg | ORAL_TABLET | Freq: Every day | ORAL | Status: DC
Start: 2015-12-01 — End: 2015-12-17

## 2015-12-01 MED ORDER — BISACODYL 5 MG PO TBEC: 5.0000 mg | DELAYED_RELEASE_TABLET | Freq: Every day | ORAL | Status: DC | PRN

## 2015-12-01 MED ORDER — HYDROCODONE-ACETAMINOPHEN 5-325 MG PO TABS: 1.0000 | ORAL_TABLET | Freq: Four times a day (QID) | ORAL | Status: DC | PRN

## 2015-12-01 MED ORDER — GUAIFENESIN ER 600 MG PO TB12: 600.0000 mg | ORAL_TABLET | Freq: Two times a day (BID) | ORAL | Status: DC

## 2015-12-01 MED ORDER — VITAMIN D3 10 MCG (400 UNIT) PO TABS: 400.0000 [IU] | ORAL_TABLET | Freq: Every day | ORAL | Status: DC

## 2015-12-01 NOTE — Discharge Instructions (Signed)
BLOOD SUGAR RUNS LOW EVEY MORNING AFTER HAVING 10-12 HOURS BREAK AFTER DINNER, MAY HAVE SOME ADRENAL INSUFFICIENCY- STARTED ON STEROID DAILY, ALSO GIVE SOME SNACKS AT NIGHT TIME TO PREVENT EARLY MORNING HYPOGLYCEMIA. AND CHECK BLOOD SUGAR AT 6 OR 7 AM EVERY MORNING.

## 2015-12-01 NOTE — Progress Notes (Signed)
Subjective:   Hemodialysis treatment yesterday. Tolerated treatment well. UF of 1 litre.   Eating breakfast this morning.   Objective:  Vital signs in last 24 hours:  Temp:  [97.6 F (36.4 C)-98.6 F (37 C)] 98.6 F (37 C) (03/10 2011) Pulse Rate:  [73] 73 (03/10 2011) Resp:  [20] 20 (03/10 2011) BP: (128)/(38) 128/38 mmHg (03/10 2011) SpO2:  [100 %] 100 % (03/10 2011) Weight:  [66.1 kg (145 lb 11.6 oz)] 66.1 kg (145 lb 11.6 oz) (03/10 0940)  Weight change:  Filed Weights   11/28/15 1415 11/28/15 1732 11/30/15 0940  Weight: 67.8 kg (149 lb 7.6 oz) 66.8 kg (147 lb 4.3 oz) 66.1 kg (145 lb 11.6 oz)    Intake/Output:    Intake/Output Summary (Last 24 hours) at 12/01/15 0824 Last data filed at 12/01/15 0500  Gross per 24 hour  Intake    360 ml  Output   1000 ml  Net   -640 ml     Physical Exam: General: No acute distress  HEENT Anicteric, adentulous  Neck supple  Pulm/lungs Left sided crackles  CV S1S2 no rubs  Abdomen:  Soft, Non tender, BS present  Extremities: No edema  Neurologic: Awake, alert, follows commands  Skin: No acute rashes  Access: LUE AVF       Basic Metabolic Panel:   Recent Labs Lab 11/26/15 0457 11/29/15 0431 11/29/15 2049 11/30/15 0941 12/01/15 0515  NA 137 138 134* 131* 134*  K 4.2 4.3 4.8 4.8 4.6  CL 97* 98* 95* 93* 96*  CO2 33* 36* 33* 30 34*  GLUCOSE 62* 59* 71 141* 74  BUN 13 10 15 19 14   CREATININE 3.07* 2.49* 3.18* 3.64* 2.84*  CALCIUM 8.1* 8.0* 8.1* 8.1* 8.1*  PHOS  --   --   --  6.0*  --      CBC:  Recent Labs Lab 11/26/15 0457 11/30/15 0941  WBC 5.9 5.3  NEUTROABS 3.3  --   HGB 9.1* 8.7*  HCT 28.7* 27.0*  MCV 85.2 82.7  PLT 234 168      Microbiology:  Recent Results (from the past 720 hour(s))  MRSA PCR Screening     Status: None   Collection Time: 11/13/15  9:46 PM  Result Value Ref Range Status   MRSA by PCR NEGATIVE NEGATIVE Final    Comment:        The GeneXpert MRSA Assay (FDA approved for  NASAL specimens only), is one component of a comprehensive MRSA colonization surveillance program. It is not intended to diagnose MRSA infection nor to guide or monitor treatment for MRSA infections.   Culture, blood (Routine X 2) w Reflex to ID Panel     Status: None   Collection Time: 11/14/15 12:53 AM  Result Value Ref Range Status   Specimen Description BLOOD RIGHT HAND  Final   Special Requests BOTTLES DRAWN AEROBIC AND ANAEROBIC 5ML  Final   Culture NO GROWTH 6 DAYS  Final   Report Status 11/20/2015 FINAL  Final  Culture, blood (Routine X 2) w Reflex to ID Panel     Status: None   Collection Time: 11/14/15  7:14 AM  Result Value Ref Range Status   Specimen Description BLOOD RIGHT HAND  Final   Special Requests   Final    BOTTLES DRAWN AEROBIC AND ANAEROBIC AER 4ML ANA 2ML   Culture NO GROWTH 6 DAYS  Final   Report Status 11/20/2015 FINAL  Final  Urine culture  Status: None   Collection Time: 11/14/15 10:16 AM  Result Value Ref Range Status   Specimen Description URINE, RANDOM  Final   Special Requests NONE  Final   Culture NO GROWTH 1 DAY  Final   Report Status 11/15/2015 FINAL  Final  Culture, expectorated sputum-assessment     Status: None   Collection Time: 11/14/15  4:20 PM  Result Value Ref Range Status   Specimen Description SPU  Final   Special Requests NONE  Final   Sputum evaluation THIS SPECIMEN IS ACCEPTABLE FOR SPUTUM CULTURE  Final   Report Status 11/14/2015 FINAL  Final  Culture, respiratory (NON-Expectorated)     Status: None   Collection Time: 11/14/15  4:20 PM  Result Value Ref Range Status   Specimen Description SPU  Final   Special Requests NONE Reflexed from [id number]  Final   Gram Stain   Final    MANY WBC SEEN NO SQUAMOUS EPITHELIAL CELLS PRESENT FEW GRAM POSITIVE COCCI EXCELLENT SPECIMEN - 90-100% WBCS    Culture   Final    LIGHT GROWTH ENTEROBACTER CLOACAE MODERATE GROWTH STREPTOCOCCUS PNEUMONIAE    Report Status 11/18/2015 FINAL   Final   Organism ID, Bacteria ENTEROBACTER CLOACAE  Final   Organism ID, Bacteria STREPTOCOCCUS PNEUMONIAE  Final      Susceptibility   Streptococcus pneumoniae - MIC*    ERYTHROMYCIN <=0.12 SENSITIVE Sensitive     LEVOFLOXACIN 1 SENSITIVE Sensitive     VANCOMYCIN 0.5 SENSITIVE Sensitive     PENICILLIN <=0.06 SENSITIVE Sensitive     CEFTRIAXONE <=0.12 SENSITIVE Sensitive     * MODERATE GROWTH STREPTOCOCCUS PNEUMONIAE   Enterobacter cloacae - MIC*    PIP/TAZO Value in next row Sensitive      SENSITIVE8    CEFAZOLIN Value in next row Resistant      RESISTANT>=64    CEFEPIME Value in next row Sensitive      SENSITIVE<=1    CEFTAZIDIME Value in next row Sensitive      SENSITIVE<=1    CEFTRIAXONE Value in next row Sensitive      SENSITIVE<=1    CIPROFLOXACIN Value in next row Sensitive      SENSITIVE<=0.25    GENTAMICIN Value in next row Sensitive      SENSITIVE<=1    IMIPENEM Value in next row Sensitive      SENSITIVE1    TRIMETH/SULFA Value in next row Sensitive      SENSITIVE<=20    * LIGHT GROWTH ENTEROBACTER CLOACAE  C difficile quick scan w PCR reflex     Status: Abnormal   Collection Time: 11/15/15  7:36 AM  Result Value Ref Range Status   C Diff antigen POSITIVE (A) NEGATIVE Final   C Diff toxin NEGATIVE NEGATIVE Final   C Diff interpretation   Final    Negative for toxigenic C. difficile. Toxin gene and active toxin production not detected. May be a nontoxigenic strain of C. difficile bacteria present, lacking the ability to produce toxin.  Clostridium Difficile by PCR     Status: None   Collection Time: 11/15/15  7:36 AM  Result Value Ref Range Status   Toxigenic C Difficile by pcr NEGATIVE NEGATIVE Final    Coagulation Studies: No results for input(s): LABPROT, INR in the last 72 hours.  Urinalysis: No results for input(s): COLORURINE, LABSPEC, PHURINE, GLUCOSEU, HGBUR, BILIRUBINUR, KETONESUR, PROTEINUR, UROBILINOGEN, NITRITE, LEUKOCYTESUR in the last 72  hours.  Invalid input(s): APPERANCEUR    Imaging: No results found.  Medications:     . acidophilus  2 capsule Oral TID  . amiodarone  200 mg Oral Daily  . atorvastatin  20 mg Oral QPM  . bacitracin   Topical BID  . chlorhexidine gluconate  15 mL Mouth Rinse BID  . cholecalciferol  400 Units Oral Daily  . citalopram  20 mg Oral Daily  . clopidogrel  75 mg Oral Daily  . guaiFENesin  600 mg Oral BID  . insulin aspart  0-5 Units Subcutaneous QHS  . insulin aspart  0-9 Units Subcutaneous TID WC  . lipase/protease/amylase  12,000 Units Oral TID WC  . pantoprazole  40 mg Oral Q1200  . predniSONE  10 mg Oral Q breakfast  . vitamin B-12  1,000 mcg Oral Daily   acetaminophen **OR** acetaminophen, albuterol, alum & mag hydroxide-simeth, bisacodyl, dextrose, HYDROcodone-acetaminophen, iohexol, nitroGLYCERIN, senna-docusate  Assessment/ Plan:  55 y.o. 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   [facility] MWF [place]  1. End Stage Renal Disease: MWF.  - Continue MWF schedule. Tolerated treatment well. UF 1 litre  2. Anemia of chronic kidney disease: hemoglobin 8.7 -  holding off on epogen given right renal masses x 2. Discontinued micera as an outpatient.   3.  Acute resp failure: 2 litres [place]. Secondary to combination of Pneumonia, Anasarca, large pleural effusions. Status post thoracentesis 966mL 11/28/14 - levofloxacin and prednisone.  - Appreciate pulmonary input   4.  Secondary Hyperparathyroidism: PTH low at 111. Phosphorus at low. Not currently on binders.   5. Renal mass x 2.  Two small enhancing masses in upper pole of right kidney, suspicious for small renal cell carcinomas seen on abdominal CT with IV contrast on February 20 - Appreciate urology input.        LOS:  Juleen China, Lurena Nida 3/11/20178:24 AM

## 2015-12-01 NOTE — Clinical Social Work Placement (Signed)
   CLINICAL SOCIAL WORK PLACEMENT  NOTE  Date:  12/01/2015  Patient Details  Name: [name] MRN: [id number] Date of Birth: [birth year]  Clinical Social Work is seeking post-discharge placement for this patient at the [place] level of care (*CSW will initial, date and re-position this form in  chart as items are completed):  Yes   Patient/family provided with [facility] Work Department's list of facilities offering this level of care within the geographic area requested by the patient (or if unable, by the patient's family).  Yes   Patient/family informed of their freedom to choose among providers that offer the needed level of care, that participate in Medicare, Medicaid or managed care program needed by the patient, have an available bed and are willing to accept the patient.  Yes   Patient/family informed of [facility]'s ownership interest in [facility] and [facility], as well as of the fact that they are under no obligation to receive care at these facilities.  PASRR submitted to EDS on       PASRR number received on       Existing PASRR number confirmed on 11/20/15     FL2 transmitted to all facilities in geographic area requested by pt/family on 11/20/15     FL2 transmitted to all facilities within larger geographic area on       Patient informed that his/her managed care company has contracts with or will negotiate with certain facilities, including the following:            Patient/family informed of bed offers received.  Patient chooses bed at  [facility])     Physician recommends and patient chooses bed at      Patient to be transferred to  [facility]) on 12/01/15.  Patient to be transferred to facility by  (EMS)     Patient family notified on 12/01/15 of transfer.  Name of family member notified:   Shakiara Wormser [facility]) [phone])     PHYSICIAN       Additional Comment:     _______________________________________________ Micah Flesher, LCSW 12/01/2015, 1:53 PM

## 2015-12-01 NOTE — Progress Notes (Signed)
Alert and oriented. Vss. No signs of acute distress. Report given to ANN at [facility]. Questions  Answered. Transported via EMS. Son was notified by Education officer, museum.

## 2015-12-01 NOTE — Progress Notes (Signed)
CSW called [facility] and spoke with Annitta Jersey RN at [phone] to confirm patient admission to facility. Per Lelon Frohlich, patient has been accepted to facility and can be transported to facility today. Discharge summary faxed to facility at [phone] and [phone]. Ann confirmed to receiving discharge summary. Discharge packet completed and given to nurse [facility]. Patient room number at [facility] is 32A, Receiving nurse is Jettie Booze, Person to give report to is Lelon Frohlich, and contact number for report is [phone].  D/c summary, placed in packet. Patient is alert and lying in bed. CSW notified patient of d/c at bedside. Patient notified son [name] via by phone while CSW was still present in room. Patient agreed to discharge plan.      Anitra Lauth, BSW, MSW, [place] Work Dept [phone]

## 2015-12-01 NOTE — Discharge Summary (Signed)
[facility] NAME: [name]    MR#:  [id number]  DATE OF BIRTH:  07/07/1961  DATE OF ADMISSION:  11/12/2015 ADMITTING PHYSICIAN: Bettey Costa, MD  DATE OF DISCHARGE: 12/01/2015  PRIMARY CARE PHYSICIAN: Ellamae Sia, MD    ADMISSION DIAGNOSIS:  Hyperkalemia [E87.5] Generalized abdominal pain [R10.84] Acute renal failure, unspecified acute renal failure type ([place]) [N17.9] Intractable vomiting with nausea, vomiting of unspecified type [R11.10] Nausea and vomiting, vomiting of unspecified type [R11.2]  DISCHARGE DIAGNOSIS:  Active Problems:   Pressure ulcer   Nausea   High temperature   Pulmonary edema   Encounter for central line placement   Encounter for orogastric (OG) tube placement   Acute renal failure (HCC)   Respiratory failure (HCC)   Renal mass   Dyspnea   Malnutrition of moderate degree   Pleural effusion   B/l pneumonia SECONDARY DIAGNOSIS:   Past Medical History  Diagnosis Date  . Asthma   . Collagen vascular disease ([place])   . Diabetes mellitus without complication ([place])   . Hypertension   . Coronary artery disease     a. cath 2013: stenting to RCA (report not available); b. cath 2014: LM nl, pLAD 40%, mLAD nl, ost LCx 40%, mid LCx nl, pRCA 30% @ site of prior stent, mRCA 50%  . Diabetic neuropathy ([place])   . ESRD (end stage renal disease) on dialysis ([place])     M-W-F  . GERD (gastroesophageal reflux disease)   . Hx of pancreatitis 2015  . Myocardial infarction ([place])   . Pneumonia   . Mitral regurgitation     a. echo 10/2013: EF 62%, noWMA, mildly dilated LA, mild to mod MR/TR, GR1DD  . COPD (chronic obstructive pulmonary disease) ([place])   . chronic diastolic CHF [id number]  . dialysis 2006  . Depression   . Anxiety   . Arthritis   . Headache   . Anemia   . Renal insufficiency     HOSPITAL COURSE:   # Bilateral pneumonia with Streptococcus and enterobacter and pleural effusions. On  levaquin. Given till 11/29/2015. Appreciate ID help. As per her  Pleural fluid (11/29/15) mainly appears lymphocytic predominance , likely it is chronic and patient also CHF so pulmonary or ID did not suggest continuing antibiotics. Pt remained stable.  # Acute respiratory failure due to pneumonia and pulmonary edema with bilateral large pleural effusions Patient was intubated and on ventilation, is status post extubation. CT scan shows no acute pulmonary emboli however did show large bilateral pleural effusions and multifocal pneumonia. Thoracentesis done ( 11/29/15) - shows many WBCs- I spoke to ID and pulm for follow up. Pt is asymptomatic and no fever. No further need for antibiotic as per pulmonary.   She c/o some pain at the thoracentesis site, but getting better.  # Acute on chronic diastolic heart failure in the setting of ESRD - Improved Repeated CXR showed CHF. Continue dialysis. She will need to follow up on the chest x-ray after a month to check for resolution of pleural effusion.  # Septic shock: Due to C. difficile and pneumonia Resolved  # Diabetic Gastroparesis Continue Phenergan and Reglan  # ESRD on hemodialysis: Continue dialysis Monday, Wednesday and Friday.  # Renal mass -Suggestive of early RCC. Urology consult appreciated , no workup at this time    Follow in urology office.  #Type 2 diabetes with gastroparesis: on Sliding scale insulin.  # Hypoglycemia. Likely adrenal insufficiency. Due  to poor oral intake, encourage oral intake. Not on basal insulin. Dietitian consult.  Started D5 IV, monitor. For last few days have just early morning hypoglycemias after having 12 hrs break from 6 pm dinners. On dietary supplementation.  Pt was started on solucortef on 11/14/15 by endocrine and later d/c due to hyperglycemia. restarted on prednisone oral as she may have adrenal insufficiency.   GIVE SNACKS BEFORE SLEEP- TO AVOID THESE EPISODES AND CHECK BLOOD SUGAR  LEVEL BETWEEN 6-7 AM DAILY.  # Dysphagia: as per speech she has signs and symptoms of chronic aspiration  Dysphagia 2 (fine chop);Nectar-thick liquid with aspiration precautions   DISCHARGE CONDITIONS:   Stable.  CONSULTS OBTAINED:  Treatment Team:  Abby Percell Locus, MD Hollice Espy, MD Adrian Prows, MD  DRUG ALLERGIES:   Allergies  Allergen Reactions  . Compazine [Prochlorperazine Edisylate] Anaphylaxis and Nausea And Vomiting  . Ace Inhibitors Swelling  . Ativan [Lorazepam] Other (See Comments)    Reaction:  Hallucinations and headaches  . Codeine Nausea And Vomiting  . Gabapentin Other (See Comments)    Reaction:  Unknown   . Losartan Other (See Comments)    Reaction:  Unknown   . Ondansetron Other (See Comments)    Reaction:  Unknown   . Prochlorperazine Other (See Comments)    Reaction:  Unknown   . Scopolamine Other (See Comments)    Reaction:  Unknown   . Zofran [Ondansetron Hcl] Other (See Comments)    Reaction:  hallucinations   . Oxycodone Anxiety  . Tape Rash    DISCHARGE MEDICATIONS:   Current Discharge Medication List    START taking these medications   Details  bisacodyl (DULCOLAX) 5 MG EC tablet Take 1 tablet (5 mg total) by mouth daily as needed for moderate constipation. Qty: 30 tablet, Refills: 0    guaiFENesin (MUCINEX) 600 MG 12 hr tablet Take 1 tablet (600 mg total) by mouth 2 (two) times daily. Qty: 20 tablet, Refills: 0    predniSONE (DELTASONE) 10 MG tablet Take 1 tablet (10 mg total) by mouth daily with breakfast. Qty: 30 tablet, Refills: 0      CONTINUE these medications which have CHANGED   Details  amiodarone (PACERONE) 200 MG tablet Take 1 tablet (200 mg total) by mouth daily. Qty: 30 tablet, Refills: 0    cholecalciferol 400 units tablet Take 1 tablet (400 Units total) by mouth daily. Qty: 30 each, Refills: 0    HYDROcodone-acetaminophen (NORCO/VICODIN) 5-325 MG tablet Take 1 tablet by mouth every 6 (six)  hours as needed for moderate pain. Qty: 30 tablet, Refills: 0      CONTINUE these medications which have NOT CHANGED   Details  acidophilus (RISAQUAD) CAPS capsule Take 1 capsule by mouth daily.    albuterol (PROVENTIL) (2.5 MG/3ML) 0.083% nebulizer solution Take 2.5 mg by nebulization every 4 (four) hours as needed for wheezing or shortness of breath.    aspirin EC 81 MG tablet Take 81 mg by mouth daily.    atorvastatin (LIPITOR) 20 MG tablet Take 20 mg by mouth every evening.    b complex-vitamin c-folic acid (NEPHRO-VITE) 0.8 MG TABS tablet Take 1 tablet by mouth daily.    citalopram (CELEXA) 20 MG tablet Take 20 mg by mouth daily.    clopidogrel (PLAVIX) 75 MG tablet Take 75 mg by mouth daily.    glucose 4 GM chewable tablet Chew 3-4 tablets by mouth as needed for low blood sugar (if pts blood sugar is less than  60).    lipase/protease/amylase (CREON) 12000 UNITS CPEP capsule Take 12,000 Units by mouth 3 (three) times daily with meals.     nitroGLYCERIN (NITROSTAT) 0.4 MG SL tablet Place 0.4 mg under the tongue every 5 (five) minutes x 3 doses as needed for chest pain. *Max 3 doses per episode*    pantoprazole (PROTONIX) 40 MG tablet Take 40 mg by mouth daily.    promethazine (PHENERGAN) 12.5 MG tablet 1-2 tablets by mouth every 8 hours as needed for nausea and vomiting Qty: 20 tablet, Refills: 0    vitamin B-12 (CYANOCOBALAMIN) 1000 MCG tablet Take 1,000 mcg by mouth daily.    sevelamer carbonate (RENVELA) 800 MG tablet Take 800 mg by mouth 3 (three) times daily with meals.       STOP taking these medications     amLODipine (NORVASC) 10 MG tablet      carvedilol (COREG) 25 MG tablet      carvedilol (COREG) 25 MG tablet      cetirizine (ZYRTEC) 10 MG tablet      metoCLOPramide (REGLAN) 5 MG tablet      metroNIDAZOLE (FLAGYL) 500 MG tablet      pregabalin (LYRICA) 25 MG capsule      promethazine (PHENERGAN) 25 MG suppository      ranolazine (RANEXA) 500 MG 12  hr tablet          DISCHARGE INSTRUCTIONS:    Follow with urology and pulmonary clinic.  If you experience worsening of your admission symptoms, develop shortness of breath, life threatening emergency, suicidal or homicidal thoughts you must seek medical attention immediately by calling 911 or calling your MD immediately  if symptoms less severe.  You Must read complete instructions/literature along with all the possible adverse reactions/side effects for all the Medicines you take and that have been prescribed to you. Take any new Medicines after you have completely understood and accept all the possible adverse reactions/side effects.   Please note  You were cared for by a hospitalist during your hospital stay. If you have any questions about your discharge medications or the care you received while you were in the hospital after you are discharged, you can call the unit and asked to speak with the hospitalist on call if the hospitalist that took care of you is not available. Once you are discharged, your primary care physician will handle any further medical issues. Please note that NO REFILLS for any discharge medications will be authorized once you are discharged, as it is imperative that you return to your primary care physician (or establish a relationship with a primary care physician if you do not have one) for your aftercare needs so that they can reassess your need for medications and monitor your lab values.    Today   CHIEF COMPLAINT:   Chief Complaint  Patient presents with  . Nausea  . Emesis    HISTORY OF PRESENT ILLNESS:  [name]  is a 55 y.o. female with a known history of ESRD, CAD and Asacol failure who presents with abdominal pain, nausea and vomiting for the past day. Patient is also complaining of shortness of breath. Patient did not go for dialysis today because of abdominal pain. CT of the abdomen ordered in the emergency room showed no acute pathology.  Patient reports that her granddaughters have been sick.   VITAL SIGNS:  Blood pressure 128/38, pulse 73, temperature 98.6 F (37 C), temperature source Oral, resp. rate 20, height 5\' 5"  (1.651  m), weight 66.1 kg (145 lb 11.6 oz), SpO2 100 %.  I/O:   Intake/Output Summary (Last 24 hours) at 12/01/15 1139 Last data filed at 12/01/15 0900  Gross per 24 hour  Intake    360 ml  Output   1000 ml  Net   -640 ml    PHYSICAL EXAMINATION:   Constitutional: She is well-developed, well-nourished, and in no distress. No distress.  HENT:  Head: Normocephalic.  Nose: Nose normal.  Decreased hearing  Eyes: No scleral icterus.  Neck: No JVD present. No tracheal deviation present.  Cardiovascular: Normal rate, regular rhythm and normal heart sounds. Exam reveals no gallop and no friction rub.  No murmur heard. Chest wall tenderness  Pulmonary/Chest: Effort normal. No respiratory distress. She has decreased breath sounds in the right lower field and the left lower field. She has no wheezes. She has no rales. She exhibits no tenderness.  Abdominal: Soft. Bowel sounds are normal. She exhibits no distension and no mass. There is no tenderness. There is no rebound and no guarding.  Musculoskeletal: Normal range of motion. She exhibits no edema.  Skin: Skin is warm. No rash noted. No erythema.    DATA REVIEW:   CBC  Recent Labs Lab 11/30/15 0941  WBC 5.3  HGB 8.7*  HCT 27.0*  PLT 168    Chemistries   Recent Labs Lab 11/29/15 2049  12/01/15 0515  NA 134*  < > 134*  K 4.8  < > 4.6  CL 95*  < > 96*  CO2 33*  < > 34*  GLUCOSE 71  < > 74  BUN 15  < > 14  CREATININE 3.18*  < > 2.84*  CALCIUM 8.1*  < > 8.1*  AST 17  --   --   ALT 12*  --   --   ALKPHOS 182*  --   --   BILITOT 0.9  --   --   < > = values in this interval not displayed.  Cardiac Enzymes No results for input(s): TROPONINI in the last 168 hours.  Microbiology Results  Results for orders placed or performed  during the hospital encounter of 11/12/15  MRSA PCR Screening     Status: None   Collection Time: 11/13/15  9:46 PM  Result Value Ref Range Status   MRSA by PCR NEGATIVE NEGATIVE Final    Comment:        The GeneXpert MRSA Assay (FDA approved for NASAL specimens only), is one component of a comprehensive MRSA colonization surveillance program. It is not intended to diagnose MRSA infection nor to guide or monitor treatment for MRSA infections.   Culture, blood (Routine X 2) w Reflex to ID Panel     Status: None   Collection Time: 11/14/15 12:53 AM  Result Value Ref Range Status   Specimen Description BLOOD RIGHT HAND  Final   Special Requests BOTTLES DRAWN AEROBIC AND ANAEROBIC 5ML  Final   Culture NO GROWTH 6 DAYS  Final   Report Status 11/20/2015 FINAL  Final  Culture, blood (Routine X 2) w Reflex to ID Panel     Status: None   Collection Time: 11/14/15  7:14 AM  Result Value Ref Range Status   Specimen Description BLOOD RIGHT HAND  Final   Special Requests   Final    BOTTLES DRAWN AEROBIC AND ANAEROBIC AER 4ML ANA 2ML   Culture NO GROWTH 6 DAYS  Final   Report Status 11/20/2015 FINAL  Final  Urine  culture     Status: None   Collection Time: 11/14/15 10:16 AM  Result Value Ref Range Status   Specimen Description URINE, RANDOM  Final   Special Requests NONE  Final   Culture NO GROWTH 1 DAY  Final   Report Status 11/15/2015 FINAL  Final  Culture, expectorated sputum-assessment     Status: None   Collection Time: 11/14/15  4:20 PM  Result Value Ref Range Status   Specimen Description SPU  Final   Special Requests NONE  Final   Sputum evaluation THIS SPECIMEN IS ACCEPTABLE FOR SPUTUM CULTURE  Final   Report Status 11/14/2015 FINAL  Final  Culture, respiratory (NON-Expectorated)     Status: None   Collection Time: 11/14/15  4:20 PM  Result Value Ref Range Status   Specimen Description SPU  Final   Special Requests NONE Reflexed from [id number]  Final   Gram Stain   Final     MANY WBC SEEN NO SQUAMOUS EPITHELIAL CELLS PRESENT FEW GRAM POSITIVE COCCI EXCELLENT SPECIMEN - 90-100% WBCS    Culture   Final    LIGHT GROWTH ENTEROBACTER CLOACAE MODERATE GROWTH STREPTOCOCCUS PNEUMONIAE    Report Status 11/18/2015 FINAL  Final   Organism ID, Bacteria ENTEROBACTER CLOACAE  Final   Organism ID, Bacteria STREPTOCOCCUS PNEUMONIAE  Final      Susceptibility   Streptococcus pneumoniae - MIC*    ERYTHROMYCIN <=0.12 SENSITIVE Sensitive     LEVOFLOXACIN 1 SENSITIVE Sensitive     VANCOMYCIN 0.5 SENSITIVE Sensitive     PENICILLIN <=0.06 SENSITIVE Sensitive     CEFTRIAXONE <=0.12 SENSITIVE Sensitive     * MODERATE GROWTH STREPTOCOCCUS PNEUMONIAE   Enterobacter cloacae - MIC*    PIP/TAZO Value in next row Sensitive      SENSITIVE8    CEFAZOLIN Value in next row Resistant      RESISTANT>=64    CEFEPIME Value in next row Sensitive      SENSITIVE<=1    CEFTAZIDIME Value in next row Sensitive      SENSITIVE<=1    CEFTRIAXONE Value in next row Sensitive      SENSITIVE<=1    CIPROFLOXACIN Value in next row Sensitive      SENSITIVE<=0.25    GENTAMICIN Value in next row Sensitive      SENSITIVE<=1    IMIPENEM Value in next row Sensitive      SENSITIVE1    TRIMETH/SULFA Value in next row Sensitive      SENSITIVE<=20    * LIGHT GROWTH ENTEROBACTER CLOACAE  C difficile quick scan w PCR reflex     Status: Abnormal   Collection Time: 11/15/15  7:36 AM  Result Value Ref Range Status   C Diff antigen POSITIVE (A) NEGATIVE Final   C Diff toxin NEGATIVE NEGATIVE Final   C Diff interpretation   Final    Negative for toxigenic C. difficile. Toxin gene and active toxin production not detected. May be a nontoxigenic strain of C. difficile bacteria present, lacking the ability to produce toxin.  Clostridium Difficile by PCR     Status: None   Collection Time: 11/15/15  7:36 AM  Result Value Ref Range Status   Toxigenic C Difficile by pcr NEGATIVE NEGATIVE Final    RADIOLOGY:   No results found.  EKG:   Orders placed or performed during the hospital encounter of 11/12/15  . ED EKG  . ED EKG  . EKG 12-Lead  . EKG 12-Lead  . EKG 12-Lead  . EKG  12-Lead  . EKG 12-Lead  . EKG 12-Lead      Management plans discussed with the patient, family and they are in agreement.  CODE STATUS:     Code Status Orders        Start     Ordered   11/12/15 1603  Full code   Continuous     11/12/15 1602    Code Status History    Date Active Date Inactive Code Status Order ID Comments User Context   10/07/2015  9:37 AM 10/12/2015  7:59 PM Full Code [id number]  Fritzi Mandes, MD ED   10/03/2015  3:55 AM 10/05/2015 10:30 PM Full Code [id number]  Lance Coon, MD Inpatient   08/31/2015  4:00 PM 09/03/2015 10:09 PM Full Code [id number]  Demetrios Loll, MD ED   07/26/2015  3:28 PM 07/26/2015  9:36 PM Full Code [id number]  Dionisio David, MD Inpatient   07/22/2015  5:36 PM 07/26/2015  3:28 PM Full Code [id number]  Vaughan Basta, MD Inpatient   06/05/2015  8:39 PM 06/07/2015  8:03 PM Full Code [id number]  Bettey Costa, MD Inpatient   05/21/2015  9:42 PM 05/24/2015  4:26 PM Full Code [id number]  Henreitta Leber, MD Inpatient   04/24/2015  8:55 AM 04/26/2015 10:11 PM Full Code [id number]  Demetrios Loll, MD Inpatient   04/01/2015  3:58 PM 04/05/2015  8:07 PM Full Code [id number]  Epifanio Lesches, MD ED   03/06/2015  8:05 AM 03/09/2015  9:11 PM Full Code [id number]  Juluis Mire, MD Inpatient   02/19/2015  4:14 PM 02/24/2015  5:41 PM Full Code [id number]  Aldean Jewett, MD Inpatient    Advance Directive Documentation        Most Recent Value   Type of Advance Directive  Healthcare Power of [place], Living will   Pre-existing out of facility DNR order (yellow form or pink MOST form)     "MOST" Form in Place?        TOTAL TIME TAKING CARE OF THIS PATIENT:35 minutes.    Vaughan Basta M.D on 12/01/2015 at 11:39 AM  Between 7am to 6pm - Pager - [phone]  After 6pm go to [email or web address] -  password [email or web address] [facility]  Office  [phone]  CC: Primary care physician; Ellamae Sia, MD   Note: This dictation was prepared with Dragon dictation along with smaller phrase technology. Any transcriptional errors that result from this process are unintentional.

## 2015-12-05 ENCOUNTER — Other Ambulatory Visit: Payer: Self-pay | Admitting: *Deleted

## 2015-12-05 DIAGNOSIS — J90 Pleural effusion, not elsewhere classified: Secondary | ICD-10-CM

## 2015-12-17 ENCOUNTER — Emergency Department: Payer: Medicare Other

## 2015-12-17 ENCOUNTER — Encounter: Payer: Self-pay | Admitting: Emergency Medicine

## 2015-12-17 ENCOUNTER — Inpatient Hospital Stay
Admission: EM | Admit: 2015-12-17 | Discharge: 2015-12-21 | DRG: 037 | Disposition: A | Discharge status: Skilled Nursing Facility | Payer: Medicare Other | Attending: Internal Medicine | Admitting: Internal Medicine

## 2015-12-17 DIAGNOSIS — D631 Anemia in chronic kidney disease: Secondary | ICD-10-CM | POA: Diagnosis present

## 2015-12-17 DIAGNOSIS — Z888 Allergy status to other drugs, medicaments and biological substances status: Secondary | ICD-10-CM

## 2015-12-17 DIAGNOSIS — R109 Unspecified abdominal pain: Secondary | ICD-10-CM | POA: Diagnosis present

## 2015-12-17 DIAGNOSIS — I5033 Acute on chronic diastolic (congestive) heart failure: Secondary | ICD-10-CM | POA: Diagnosis present

## 2015-12-17 DIAGNOSIS — M358 Other specified systemic involvement of connective tissue: Secondary | ICD-10-CM | POA: Diagnosis present

## 2015-12-17 DIAGNOSIS — Y832 Surgical operation with anastomosis, bypass or graft as the cause of abnormal reaction of the patient, or of later complication, without mention of misadventure at the time of the procedure: Secondary | ICD-10-CM | POA: Diagnosis present

## 2015-12-17 DIAGNOSIS — F329 Major depressive disorder, single episode, unspecified: Secondary | ICD-10-CM | POA: Diagnosis present

## 2015-12-17 DIAGNOSIS — E11319 Type 2 diabetes mellitus with unspecified diabetic retinopathy without macular edema: Secondary | ICD-10-CM | POA: Diagnosis present

## 2015-12-17 DIAGNOSIS — F419 Anxiety disorder, unspecified: Secondary | ICD-10-CM | POA: Diagnosis present

## 2015-12-17 DIAGNOSIS — E1143 Type 2 diabetes mellitus with diabetic autonomic (poly)neuropathy: Principal | ICD-10-CM | POA: Diagnosis present

## 2015-12-17 DIAGNOSIS — I998 Other disorder of circulatory system: Secondary | ICD-10-CM | POA: Diagnosis present

## 2015-12-17 DIAGNOSIS — I252 Old myocardial infarction: Secondary | ICD-10-CM

## 2015-12-17 DIAGNOSIS — Z7951 Long term (current) use of inhaled steroids: Secondary | ICD-10-CM

## 2015-12-17 DIAGNOSIS — J440 Chronic obstructive pulmonary disease with acute lower respiratory infection: Secondary | ICD-10-CM | POA: Diagnosis present

## 2015-12-17 DIAGNOSIS — Z66 Do not resuscitate: Secondary | ICD-10-CM | POA: Diagnosis present

## 2015-12-17 DIAGNOSIS — I509 Heart failure, unspecified: Secondary | ICD-10-CM

## 2015-12-17 DIAGNOSIS — E875 Hyperkalemia: Secondary | ICD-10-CM | POA: Diagnosis present

## 2015-12-17 DIAGNOSIS — Z7902 Long term (current) use of antithrombotics/antiplatelets: Secondary | ICD-10-CM

## 2015-12-17 DIAGNOSIS — Z885 Allergy status to narcotic agent status: Secondary | ICD-10-CM

## 2015-12-17 DIAGNOSIS — E1122 Type 2 diabetes mellitus with diabetic chronic kidney disease: Secondary | ICD-10-CM | POA: Diagnosis present

## 2015-12-17 DIAGNOSIS — R1011 Right upper quadrant pain: Secondary | ICD-10-CM | POA: Diagnosis present

## 2015-12-17 DIAGNOSIS — K3184 Gastroparesis: Secondary | ICD-10-CM | POA: Diagnosis present

## 2015-12-17 DIAGNOSIS — I132 Hypertensive heart and chronic kidney disease with heart failure and with stage 5 chronic kidney disease, or end stage renal disease: Secondary | ICD-10-CM | POA: Diagnosis present

## 2015-12-17 DIAGNOSIS — T82818A Embolism of vascular prosthetic devices, implants and grafts, initial encounter: Secondary | ICD-10-CM | POA: Diagnosis present

## 2015-12-17 DIAGNOSIS — E785 Hyperlipidemia, unspecified: Secondary | ICD-10-CM | POA: Diagnosis present

## 2015-12-17 DIAGNOSIS — K219 Gastro-esophageal reflux disease without esophagitis: Secondary | ICD-10-CM | POA: Diagnosis present

## 2015-12-17 DIAGNOSIS — J189 Pneumonia, unspecified organism: Secondary | ICD-10-CM | POA: Diagnosis present

## 2015-12-17 DIAGNOSIS — Z79899 Other long term (current) drug therapy: Secondary | ICD-10-CM

## 2015-12-17 DIAGNOSIS — R110 Nausea: Secondary | ICD-10-CM

## 2015-12-17 DIAGNOSIS — I2510 Atherosclerotic heart disease of native coronary artery without angina pectoris: Secondary | ICD-10-CM | POA: Diagnosis present

## 2015-12-17 DIAGNOSIS — Z992 Dependence on renal dialysis: Secondary | ICD-10-CM

## 2015-12-17 DIAGNOSIS — N186 End stage renal disease: Secondary | ICD-10-CM | POA: Diagnosis present

## 2015-12-17 DIAGNOSIS — N2889 Other specified disorders of kidney and ureter: Secondary | ICD-10-CM | POA: Diagnosis present

## 2015-12-17 DIAGNOSIS — Z7982 Long term (current) use of aspirin: Secondary | ICD-10-CM

## 2015-12-17 DIAGNOSIS — Z7952 Long term (current) use of systemic steroids: Secondary | ICD-10-CM

## 2015-12-17 DIAGNOSIS — N2581 Secondary hyperparathyroidism of renal origin: Secondary | ICD-10-CM | POA: Diagnosis present

## 2015-12-17 DIAGNOSIS — D638 Anemia in other chronic diseases classified elsewhere: Secondary | ICD-10-CM | POA: Diagnosis present

## 2015-12-17 DIAGNOSIS — F17210 Nicotine dependence, cigarettes, uncomplicated: Secondary | ICD-10-CM | POA: Diagnosis present

## 2015-12-17 DIAGNOSIS — Z794 Long term (current) use of insulin: Secondary | ICD-10-CM

## 2015-12-17 DIAGNOSIS — K5900 Constipation, unspecified: Secondary | ICD-10-CM

## 2015-12-17 DIAGNOSIS — I340 Nonrheumatic mitral (valve) insufficiency: Secondary | ICD-10-CM | POA: Diagnosis present

## 2015-12-17 DIAGNOSIS — J45909 Unspecified asthma, uncomplicated: Secondary | ICD-10-CM | POA: Diagnosis present

## 2015-12-17 LAB — CBC WITH DIFFERENTIAL/PLATELET
Band Neutrophils: 1 %
Basophils Absolute: 0 10*3/uL (ref 0–0.1)
Basophils Relative: 0 %
Blasts: 0 %
Eosinophils Absolute: 0.2 10*3/uL (ref 0–0.7)
Eosinophils Relative: 3 %
HCT: 29.4 % — ABNORMAL LOW (ref 35.0–47.0)
Hemoglobin: 9.5 g/dL — ABNORMAL LOW (ref 12.0–16.0)
Lymphocytes Relative: 33 %
Lymphs Abs: 2.3 10*3/uL (ref 1.0–3.6)
MCH: 27.6 pg (ref 26.0–34.0)
MCHC: 32.3 g/dL (ref 32.0–36.0)
MCV: 85.3 fL (ref 80.0–100.0)
Metamyelocytes Relative: 0 %
Monocytes Absolute: 0.6 10*3/uL (ref 0.2–0.9)
Monocytes Relative: 9 %
Myelocytes: 0 %
Neutro Abs: 3.8 10*3/uL (ref 1.4–6.5)
Neutrophils Relative %: 54 %
Other: 0 %
Platelets: 343 10*3/uL (ref 150–440)
Promyelocytes Absolute: 0 %
RBC: 3.45 MIL/uL — ABNORMAL LOW (ref 3.80–5.20)
RDW: 26 % — ABNORMAL HIGH (ref 11.5–14.5)
WBC: 6.9 10*3/uL (ref 3.6–11.0)
nRBC: 0 /100 WBC

## 2015-12-17 LAB — CREATININE, SERUM
Creatinine, Ser: 5 mg/dL — ABNORMAL HIGH (ref 0.44–1.00)
GFR calc Af Amer: 10 mL/min — ABNORMAL LOW (ref >60)
GFR calc non Af Amer: 9 mL/min — ABNORMAL LOW (ref >60)

## 2015-12-17 LAB — CBC
HCT: 28.7 % — ABNORMAL LOW (ref 35.0–47.0)
Hemoglobin: 9.1 g/dL — ABNORMAL LOW (ref 12.0–16.0)
MCH: 27.4 pg (ref 26.0–34.0)
MCHC: 31.8 g/dL — ABNORMAL LOW (ref 32.0–36.0)
MCV: 86 fL (ref 80.0–100.0)
Platelets: 318 10*3/uL (ref 150–440)
RBC: 3.34 MIL/uL — ABNORMAL LOW (ref 3.80–5.20)
RDW: 26.2 % — ABNORMAL HIGH (ref 11.5–14.5)
WBC: 7.1 10*3/uL (ref 3.6–11.0)

## 2015-12-17 LAB — GLUCOSE, CAPILLARY
Glucose-Capillary: 113 mg/dL — ABNORMAL HIGH (ref 65–99)
Glucose-Capillary: 121 mg/dL — ABNORMAL HIGH (ref 65–99)
Glucose-Capillary: 36 mg/dL — CL (ref 65–99)
Glucose-Capillary: 58 mg/dL — ABNORMAL LOW (ref 65–99)
Glucose-Capillary: 55 mg/dL — ABNORMAL LOW (ref 65–99)
Glucose-Capillary: 79 mg/dL (ref 65–99)

## 2015-12-17 LAB — COMPREHENSIVE METABOLIC PANEL
ALT: 15 U/L (ref 14–54)
AST: 18 U/L (ref 15–41)
Albumin: 2.7 g/dL — ABNORMAL LOW (ref 3.5–5.0)
Alkaline Phosphatase: 147 U/L — ABNORMAL HIGH (ref 38–126)
Anion gap: 10 (ref 5–15)
BUN: 60 mg/dL — ABNORMAL HIGH (ref 6–20)
CO2: 26 mmol/L (ref 22–32)
Calcium: 8.6 mg/dL — ABNORMAL LOW (ref 8.9–10.3)
Chloride: 94 mmol/L — ABNORMAL LOW (ref 101–111)
Creatinine, Ser: 5.09 mg/dL — ABNORMAL HIGH (ref 0.44–1.00)
GFR calc Af Amer: 10 mL/min — ABNORMAL LOW (ref >60)
GFR calc non Af Amer: 9 mL/min — ABNORMAL LOW (ref >60)
Glucose, Bld: 95 mg/dL (ref 65–99)
Potassium: 5.9 mmol/L — ABNORMAL HIGH (ref 3.5–5.1)
Sodium: 130 mmol/L — ABNORMAL LOW (ref 135–145)
Total Bilirubin: 1 mg/dL (ref 0.3–1.2)
Total Protein: 7.6 g/dL (ref 6.5–8.1)

## 2015-12-17 LAB — MRSA PCR SCREENING: MRSA by PCR: NEGATIVE

## 2015-12-17 LAB — LIPASE, BLOOD: Lipase: 22 U/L (ref 11–51)

## 2015-12-17 MED ORDER — CITALOPRAM HYDROBROMIDE 20 MG PO TABS
20.0000 mg | ORAL_TABLET | Freq: Every day | ORAL | Status: DC
  Administered 2015-12-18 – 2015-12-20 (×3): 20 mg via ORAL
  Filled 2015-12-17 (×3): qty 1

## 2015-12-17 MED ORDER — BISACODYL 5 MG PO TBEC: 5.0000 mg | DELAYED_RELEASE_TABLET | Freq: Every day | ORAL | Status: DC | PRN

## 2015-12-17 MED ORDER — RENA-VITE PO TABS
1.0000 | ORAL_TABLET | Freq: Every day | ORAL | Status: DC
Start: 2015-12-17 — End: 2015-12-21
  Administered 2015-12-17 – 2015-12-20 (×4): 1 via ORAL
  Filled 2015-12-17 (×5): qty 1

## 2015-12-17 MED ORDER — HEPARIN SODIUM (PORCINE) 5000 UNIT/ML IJ SOLN
5000.0000 [IU] | Freq: Three times a day (TID) | INTRAMUSCULAR | Status: DC
  Administered 2015-12-17 – 2015-12-21 (×8): 5000 [IU] via SUBCUTANEOUS
  Filled 2015-12-17 (×9): qty 1

## 2015-12-17 MED ORDER — METOCLOPRAMIDE HCL 5 MG/ML IJ SOLN
10.0000 mg | Freq: Once | INTRAMUSCULAR | Status: DC
  Filled 2015-12-17: qty 2

## 2015-12-17 MED ORDER — INSULIN ASPART 100 UNIT/ML ~~LOC~~ SOLN
0.0000 [IU] | Freq: Three times a day (TID) | SUBCUTANEOUS | Status: DC
  Administered 2015-12-17: 1 [IU] via SUBCUTANEOUS
  Administered 2015-12-21: 2 [IU] via SUBCUTANEOUS
  Filled 2015-12-17 (×2): qty 1
  Filled 2015-12-17: qty 2

## 2015-12-17 MED ORDER — ACETAMINOPHEN 650 MG RE SUPP: 650.0000 mg | Freq: Four times a day (QID) | RECTAL | Status: DC | PRN

## 2015-12-17 MED ORDER — SODIUM CHLORIDE 0.9 % IV SOLN: 250.0000 mL | INTRAVENOUS | Status: DC | PRN

## 2015-12-17 MED ORDER — ALBUTEROL SULFATE (2.5 MG/3ML) 0.083% IN NEBU
2.5000 mg | INHALATION_SOLUTION | RESPIRATORY_TRACT | Status: DC | PRN
Start: 2015-12-17 — End: 2015-12-21

## 2015-12-17 MED ORDER — METOCLOPRAMIDE HCL 5 MG/ML IJ SOLN
10.0000 mg | Freq: Three times a day (TID) | INTRAMUSCULAR | Status: DC
  Administered 2015-12-17 – 2015-12-21 (×9): 10 mg via INTRAVENOUS
  Filled 2015-12-17 (×9): qty 2

## 2015-12-17 MED ORDER — HYDROMORPHONE HCL 1 MG/ML IJ SOLN
INTRAMUSCULAR | Status: AC
  Administered 2015-12-17: 0.5 mg via INTRAVENOUS
  Filled 2015-12-17: qty 1

## 2015-12-17 MED ORDER — SEVELAMER CARBONATE 800 MG PO TABS
800.0000 mg | ORAL_TABLET | Freq: Three times a day (TID) | ORAL | Status: DC
  Administered 2015-12-17 – 2015-12-20 (×7): 800 mg via ORAL
  Filled 2015-12-17 (×8): qty 1

## 2015-12-17 MED ORDER — VITAMIN B-12 1000 MCG PO TABS
1000.0000 ug | ORAL_TABLET | Freq: Every day | ORAL | Status: DC
  Administered 2015-12-18 – 2015-12-20 (×3): 1000 ug via ORAL
  Filled 2015-12-17 (×4): qty 1

## 2015-12-17 MED ORDER — AMIODARONE HCL 200 MG PO TABS
200.0000 mg | ORAL_TABLET | Freq: Every day | ORAL | Status: DC
  Administered 2015-12-18 – 2015-12-20 (×3): 200 mg via ORAL
  Filled 2015-12-17 (×3): qty 1

## 2015-12-17 MED ORDER — PREDNISONE 10 MG PO TABS
10.0000 mg | ORAL_TABLET | Freq: Every day | ORAL | Status: DC
  Administered 2015-12-18 – 2015-12-20 (×3): 10 mg via ORAL
  Filled 2015-12-17 (×3): qty 1

## 2015-12-17 MED ORDER — HYDROMORPHONE HCL 1 MG/ML IJ SOLN
1.0000 mg | Freq: Once | INTRAMUSCULAR | Status: DC
  Filled 2015-12-17: qty 1

## 2015-12-17 MED ORDER — PROMETHAZINE HCL 25 MG/ML IJ SOLN
INTRAMUSCULAR | Status: AC
  Administered 2015-12-17: 12.5 mg via INTRAVENOUS
  Filled 2015-12-17: qty 1

## 2015-12-17 MED ORDER — PANTOPRAZOLE SODIUM 40 MG PO TBEC
40.0000 mg | DELAYED_RELEASE_TABLET | Freq: Every day | ORAL | Status: DC
  Administered 2015-12-18 – 2015-12-20 (×3): 40 mg via ORAL
  Filled 2015-12-17 (×3): qty 1

## 2015-12-17 MED ORDER — PROMETHAZINE HCL 25 MG/ML IJ SOLN
12.5000 mg | Freq: Once | INTRAMUSCULAR | Status: AC
  Administered 2015-12-17: 12.5 mg via INTRAVENOUS

## 2015-12-17 MED ORDER — PANCRELIPASE (LIP-PROT-AMYL) 12000-38000 UNITS PO CPEP
12000.0000 [IU] | ORAL_CAPSULE | Freq: Three times a day (TID) | ORAL | Status: DC
  Administered 2015-12-17 – 2015-12-20 (×7): 12000 [IU] via ORAL
  Filled 2015-12-17 (×7): qty 1

## 2015-12-17 MED ORDER — ACETAMINOPHEN 325 MG PO TABS: 650.0000 mg | ORAL_TABLET | Freq: Four times a day (QID) | ORAL | Status: DC | PRN

## 2015-12-17 MED ORDER — GLUCOSE 4 G PO CHEW
3.0000 | CHEWABLE_TABLET | ORAL | Status: DC | PRN
  Administered 2015-12-17: 16 g via ORAL
  Filled 2015-12-17: qty 1

## 2015-12-17 MED ORDER — HYDROMORPHONE HCL 1 MG/ML IJ SOLN
0.5000 mg | INTRAMUSCULAR | Status: DC | PRN
  Administered 2015-12-17 – 2015-12-21 (×9): 0.5 mg via INTRAVENOUS
  Filled 2015-12-17 (×8): qty 1

## 2015-12-17 MED ORDER — SODIUM CHLORIDE 0.9% FLUSH
3.0000 mL | Freq: Two times a day (BID) | INTRAVENOUS | Status: DC
  Administered 2015-12-17 – 2015-12-20 (×7): 3 mL via INTRAVENOUS

## 2015-12-17 MED ORDER — ASPIRIN EC 81 MG PO TBEC
81.0000 mg | DELAYED_RELEASE_TABLET | Freq: Every day | ORAL | Status: DC
  Administered 2015-12-18 – 2015-12-20 (×3): 81 mg via ORAL
  Filled 2015-12-17 (×3): qty 1

## 2015-12-17 MED ORDER — SODIUM CHLORIDE 0.9% FLUSH: 3.0000 mL | INTRAVENOUS | Status: DC | PRN

## 2015-12-17 MED ORDER — GUAIFENESIN ER 600 MG PO TB12
600.0000 mg | ORAL_TABLET | Freq: Two times a day (BID) | ORAL | Status: DC
  Administered 2015-12-17 – 2015-12-20 (×7): 600 mg via ORAL
  Filled 2015-12-17 (×7): qty 1

## 2015-12-17 MED ORDER — HYDRALAZINE HCL 20 MG/ML IJ SOLN: 10.0000 mg | Freq: Four times a day (QID) | INTRAMUSCULAR | Status: DC | PRN

## 2015-12-17 MED ORDER — ATORVASTATIN CALCIUM 20 MG PO TABS
20.0000 mg | ORAL_TABLET | Freq: Every evening | ORAL | Status: DC
  Administered 2015-12-17 – 2015-12-19 (×2): 20 mg via ORAL
  Filled 2015-12-17 (×2): qty 1

## 2015-12-17 MED ORDER — RISAQUAD PO CAPS
1.0000 | ORAL_CAPSULE | Freq: Every day | ORAL | Status: DC
  Administered 2015-12-18 – 2015-12-19 (×2): 1 via ORAL
  Filled 2015-12-17 (×2): qty 1

## 2015-12-17 MED ORDER — METOCLOPRAMIDE HCL 5 MG/ML IJ SOLN
10.0000 mg | Freq: Once | INTRAMUSCULAR | Status: AC
  Administered 2015-12-17: 10 mg via INTRAMUSCULAR

## 2015-12-17 MED ORDER — CLOPIDOGREL BISULFATE 75 MG PO TABS
75.0000 mg | ORAL_TABLET | Freq: Every day | ORAL | Status: DC
  Administered 2015-12-18 – 2015-12-20 (×3): 75 mg via ORAL
  Filled 2015-12-17 (×3): qty 1

## 2015-12-17 MED ORDER — PROMETHAZINE HCL 25 MG/ML IJ SOLN: 12.5000 mg | Freq: Once | INTRAMUSCULAR | Status: DC

## 2015-12-17 MED ORDER — HYDROMORPHONE HCL 1 MG/ML IJ SOLN
1.0000 mg | Freq: Once | INTRAMUSCULAR | Status: AC
Start: 2015-12-17 — End: 2015-12-17
  Administered 2015-12-17: 1 mg via INTRAMUSCULAR

## 2015-12-17 MED ORDER — HYDROCODONE-ACETAMINOPHEN 5-325 MG PO TABS
1.0000 | ORAL_TABLET | ORAL | Status: DC | PRN
  Administered 2015-12-17 – 2015-12-18 (×3): 2 via ORAL
  Administered 2015-12-18 – 2015-12-19 (×2): 1 via ORAL
  Administered 2015-12-19 – 2015-12-21 (×6): 2 via ORAL
  Administered 2015-12-21: 1 via ORAL
  Filled 2015-12-17 (×3): qty 2
  Filled 2015-12-17 (×2): qty 1
  Filled 2015-12-17 (×6): qty 2
  Filled 2015-12-17: qty 1
  Filled 2015-12-17 (×2): qty 2

## 2015-12-17 MED ORDER — CHOLECALCIFEROL 10 MCG (400 UNIT) PO TABS
400.0000 [IU] | ORAL_TABLET | Freq: Every day | ORAL | Status: DC
  Administered 2015-12-18 – 2015-12-20 (×3): 400 [IU] via ORAL
  Filled 2015-12-17 (×3): qty 1

## 2015-12-17 MED ORDER — NITROGLYCERIN 0.4 MG SL SUBL: 0.4000 mg | SUBLINGUAL_TABLET | SUBLINGUAL | Status: DC | PRN

## 2015-12-17 NOTE — ED Notes (Addendum)
Attempted to give report, but floor receptionist states that room 226 (previously assigned to patient) is now being closed and that another room is currently being stat cleaned.  Receptionist to call me back when room is available.

## 2015-12-17 NOTE — Progress Notes (Signed)
Spoke with Dr. Posey Pronto okay to place order for vascular consult as pt was not able to do dialysis because her fistula was clotted off. Also okay to leave hepatitis labs that were to be drawn in dialysis.

## 2015-12-17 NOTE — Progress Notes (Signed)
Pre-dialysis treatment note.

## 2015-12-17 NOTE — Progress Notes (Addendum)
Upon assessment patients left upper arm graft noted to be absent bruit/thrill.Arterial site attempted to cannulate for confirmation and clotted dark blood returned.Needle removed and gauze/taped placed at site.Dr.Kolluru made aware and gave order that vasular will be consulted for declot procedure tomorrow.Patient with no shortness of breath or chest pain complaints.Reported to primary nurse,A.Goodman,RN of situation.Vitals=187/82,65 with O2 saturations=97% on room air.Patient transported back to 209.

## 2015-12-17 NOTE — ED Notes (Signed)
Multiple IV insertions attempted with no success.  MD aware and pain meds changed to IM.

## 2015-12-17 NOTE — Care Management Note (Signed)
Case Management Note  Patient Details  Name: [name] MRN: [id number] Date of Birth: 09-28-1960  Subjective/Objective: Contact information left on patient chart, for [name] liasion [phone]. The number is there in case the patient does not require admission to hospital, and may need dialysis re-scheduled.                   Action/Plan:   Expected Discharge Date:                  Expected Discharge Plan:     In-House Referral:     Discharge planning Services     Post Acute Care Choice:    Choice offered to:     DME Arranged:    DME Agency:     HH Arranged:    [place] Agency:     Status of Service:     Medicare Important Message Given:    Date Medicare IM Given:    Medicare IM give by:    Date Additional Medicare IM Given:    Additional Medicare Important Message give by:     If discussed at [place] of Stay Meetings, dates discussed:    Additional Comments:  Beau Fanny, RN 12/17/2015, 9:40 AM

## 2015-12-17 NOTE — Progress Notes (Signed)
Pre dialysis assessment 

## 2015-12-17 NOTE — Care Management Obs Status (Signed)
[facility] NOTIFICATION   Patient Details  Name: [name] MRN: [id number] Date of Birth: [birth year]   Medicare Observation Status Notification Given:  Yes    Beau Fanny, RN 12/17/2015, 1:24 PM

## 2015-12-17 NOTE — Progress Notes (Signed)
Subjective:   Admitted for nausea and vomiting. Missed dialysis this morning. Potassium elevated  Objective:  Vital signs in last 24 hours:  Temp:  [97.6 F (36.4 C)] 97.6 F (36.4 C) (03/27 0954) Pulse Rate:  [57-60] 60 (03/27 0954) Resp:  [20-23] 20 (03/27 0954) BP: (182-197)/(86-104) 190/95 mmHg (03/27 0954) SpO2:  [96 %-100 %] 96 % (03/27 0954) Weight:  [63.504 kg (140 lb)] 63.504 kg (140 lb) (03/27 0734)  Weight change:  Filed Weights   12/17/15 0734  Weight: 63.504 kg (140 lb)    Intake/Output:   No intake or output data in the 24 hours ending 12/17/15 1450   Physical Exam: General: No acute distress  HEENT Anicteric, adentulous  Neck supple  Pulm/lungs Left sided crackles  CV S1S2 no rubs  Abdomen:  Soft, Non tender, BS present  Extremities: No edema  Neurologic: Awake, alert, follows commands  Skin: No acute rashes  Access: LUE AVF       Basic Metabolic Panel:   Recent Labs Lab 12/17/15 0942 12/17/15 1227  NA 130*  --   K 5.9*  --   CL 94*  --   CO2 26  --   GLUCOSE 95  --   BUN 60*  --   CREATININE 5.09* 5.00*  CALCIUM 8.6*  --      CBC:  Recent Labs Lab 12/17/15 0942 12/17/15 1227  WBC 6.9 7.1  NEUTROABS 3.8  --   HGB 9.5* 9.1*  HCT 29.4* 28.7*  MCV 85.3 86.0  PLT 343 318      Microbiology:  No results found for this or any previous visit (from the past 720 hour(s)).  Coagulation Studies: No results for input(s): LABPROT, INR in the last 72 hours.  Urinalysis: No results for input(s): COLORURINE, LABSPEC, PHURINE, GLUCOSEU, HGBUR, BILIRUBINUR, KETONESUR, PROTEINUR, UROBILINOGEN, NITRITE, LEUKOCYTESUR in the last 72 hours.  Invalid input(s): APPERANCEUR    Imaging: Dg Abd Acute W/chest  12/17/2015  CLINICAL DATA:  Nausea and abdominal pain.  Chronic renal failure. EXAM: DG ABDOMEN ACUTE W/ 1V CHEST COMPARISON:  Chest radiograph November 28, 2015; abdominal radiograph November 17, 2015 FINDINGS: PA chest: There are  bilateral pleural effusions with bibasilar atelectasis. There is mild cardiomegaly with pulmonary venous hypertension. No frank edema. There is a stent in the subclavian and axillary regions on the left. Supine and upright abdomen: There is diffuse stool throughout colon. There is no bowel dilatation or air-fluid level suggesting obstruction. No free air. There is extensive vascular calcification. There are surgical clips in right upper quadrant region. IMPRESSION: Diffuse stool throughout colon. No bowel obstruction or free air. Evidence of a degree of congestive heart failure. Electronically Signed   By: Lowella Grip III M.D.   On: 12/17/2015 10:27     Medications:   . sodium chloride     . acidophilus  1 capsule Oral Daily  . amiodarone  200 mg Oral Daily  . aspirin EC  81 mg Oral Daily  . atorvastatin  20 mg Oral QPM  . cholecalciferol  400 Units Oral Daily  . citalopram  20 mg Oral Daily  . clopidogrel  75 mg Oral Daily  . guaiFENesin  600 mg Oral BID  . heparin  5,000 Units Subcutaneous 3 times per day  . HYDROmorphone      . insulin aspart  0-9 Units Subcutaneous TID WC  . lipase/protease/amylase  12,000 Units Oral TID WC  . metoCLOPramide (REGLAN) injection  10 mg Intravenous 3 times  per day  . multivitamin  1 tablet Oral QHS  . pantoprazole  40 mg Oral Daily  . [START ON 12/18/2015] predniSONE  10 mg Oral Q breakfast  . promethazine      . promethazine  12.5 mg Intravenous Once  . sevelamer carbonate  800 mg Oral TID WC  . sodium chloride flush  3 mL Intravenous Q12H  . vitamin B-12  1,000 mcg Oral Daily   sodium chloride, acetaminophen **OR** acetaminophen, albuterol, bisacodyl, glucose, hydrALAZINE, HYDROcodone-acetaminophen, HYDROmorphone (DILAUDID) injection, nitroGLYCERIN, sodium chloride flush  Assessment/ Plan:  55 y.o. 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   [facility] MWF [facility]  1. End Stage Renal Disease with hyperkalemia: MWF.  - Continue MWF schedule. Dialysis for later today.   2. Anemia of chronic kidney disease: hemoglobin 9.1 -  holding off on epogen given right renal masses x 2. Discontinued micera as an outpatient.   3.   Secondary Hyperparathyroidism: PTH at goal 173 as outpatient. Phosphorus and calcium at goal as outpatient.  - Not currently on binders.   4. Hypertension: pain driven. Not well controlled.   5. Renal mass x 2.  Two small enhancing masses in upper pole of right kidney, suspicious for small renal cell carcinomas seen on abdominal CT with IV contrast on February 20 - holding epo.        LOS:  Kyre Jeffries 3/27/20172:50 PM

## 2015-12-17 NOTE — ED Notes (Signed)
Pt went to dialysis today with increased nausea and abdominal pain, and EMS was called.  EMS states that patient's BP was 200/105 on arrival to Dialysis.  Pt has recent history of being admitted to [facility] with pancreatitis.  Pt arrived to ED on RA with adequate O2 sats.

## 2015-12-17 NOTE — ED Provider Notes (Signed)
[facility] Emergency Department Provider Note  ____________________________________________  Time seen: Approximately 8:00 AM  I have reviewed the triage vital signs and the nursing notes.   HISTORY  Chief Complaint Abdominal Pain and Nausea    HPI [name] is a 55 y.o. female with CAD, DM, HTN, ESRD on dialysis dialyzed MWF, recurrent episodes of right upper quadrant/epigastric abdominal pain and nausea vomiting related to gastroparesis, also with history of pancreatitis who presents for evaluation of right upper quadrant pain and nausea, consistent with her usual gastroparesis flares, onset today, constant since onset, currently severe, no modifying factors. The patient went to dialysis however was having too much pain and nausea to be dialyzed. She was referred to the emergency department. She denies any fevers. No chest pain or difficulty breathing. She reports she was last dialyzed on Friday. She is seen here often for strikingly similar presentations.   Past Medical History  Diagnosis Date  . Asthma   . Collagen vascular disease ([place])   . Diabetes mellitus without complication ([place])   . Hypertension   . Coronary artery disease     a. cath 2013: stenting to RCA (report not available); b. cath 2014: LM nl, pLAD 40%, mLAD nl, ost LCx 40%, mid LCx nl, pRCA 30% @ site of prior stent, mRCA 50%  . Diabetic neuropathy ([place])   . ESRD (end stage renal disease) on dialysis ([place])     M-W-F  . GERD (gastroesophageal reflux disease)   . Hx of pancreatitis 2015  . Myocardial infarction ([place])   . Pneumonia   . Mitral regurgitation     a. echo 10/2013: EF 62%, noWMA, mildly dilated LA, mild to mod MR/TR, GR1DD  . COPD (chronic obstructive pulmonary disease) ([place])   . chronic diastolic CHF [id number]  . dialysis 2006  . Depression   . Anxiety   . Arthritis   . Headache   . Anemia   . Renal insufficiency     Patient Active Problem List   Diagnosis Date  Noted  . Malnutrition of moderate degree 12/01/2015  . Renal mass   . Dyspnea   . Acute renal failure ([place])   . Respiratory failure ([place])   . High temperature 11/14/2015  . Pulmonary edema   . Encounter for central line placement   . Encounter for orogastric (OG) tube placement   . Nausea 11/12/2015  . Hyperkalemia 10/03/2015  . C. difficile diarrhea 07/22/2015  . Pneumonia 05/21/2015  . Hypoglycemia 04/24/2015  . Unresponsiveness 04/24/2015  . Bradycardia 04/24/2015  . Hypothermia 04/24/2015  . Acute respiratory failure ([place]) 04/24/2015  . Acute diastolic CHF (congestive heart failure) ([place]) 04/05/2015  . Diabetic gastroparesis ([place]) 04/05/2015  . Hypokalemia 04/05/2015  . Generalized weakness 04/05/2015  . Acute pulmonary edema ([place]) 04/03/2015  . Nausea and vomiting 04/03/2015  . Hypoglycemia associated with diabetes ([place]) 04/03/2015  . Anemia of chronic disease 04/03/2015  . Secondary hyperparathyroidism ([place]) 04/03/2015  . Pressure ulcer 04/02/2015  . Acute respiratory failure with hypoxia ([place]) 04/01/2015  . Adjustment disorder with anxiety 03/14/2015  . Somatic symptom disorder, mild 03/08/2015  . Coronary artery disease involving native coronary artery of native heart without angina pectoris   . Nausea & vomiting 03/06/2015  . Abdominal pain 03/06/2015  . DM (diabetes mellitus) ([place]) 03/06/2015  . HTN (hypertension) 03/06/2015  . Gastroparesis 02/24/2015  . Pleural effusion 02/19/2015  . HCAP (healthcare-associated pneumonia) 02/19/2015  . End-stage renal disease on hemodialysis ([place]) 02/19/2015  Past Surgical History  Procedure Laterality Date  . Cholecystectomy    . Appendectomy    . Abdominal hysterectomy    . Dialysis fistula creation Left     upper arm  . Esophagogastroduodenoscopy N/A 03/08/2015    Procedure: ESOPHAGOGASTRODUODENOSCOPY (EGD);  Surgeon: Manya Silvas, MD;  Location[facility] ENDOSCOPY;  Service: Endoscopy;  Laterality: N/A;  . Cardiac  catheterization Left 07/26/2015    Procedure: Left Heart Cath and Coronary Angiography;  Surgeon: Dionisio David, MD;  Location: [facility] CV LAB;  Service: Cardiovascular;  Laterality: Left;  Marland Kitchen Eye surgery    . Fecal transplant N/A 08/23/2015    Procedure: FECAL TRANSPLANT;  Surgeon: Manya Silvas, MD;  Location: [facility] ENDOSCOPY;  Service: Endoscopy;  Laterality: N/A;    Current Outpatient Rx  Name  Route  Sig  Dispense  Refill  . acidophilus (RISAQUAD) CAPS capsule   Oral   Take 1 capsule by mouth daily.         Marland Kitchen albuterol (PROVENTIL) (2.5 MG/3ML) 0.083% nebulizer solution   Nebulization   Take 2.5 mg by nebulization every 4 (four) hours as needed for wheezing or shortness of breath.         Marland Kitchen amiodarone (PACERONE) 200 MG tablet   Oral   Take 1 tablet (200 mg total) by mouth daily.   30 tablet   0   . aspirin EC 81 MG tablet   Oral   Take 81 mg by mouth daily.         Marland Kitchen atorvastatin (LIPITOR) 20 MG tablet   Oral   Take 20 mg by mouth every evening.         Marland Kitchen b complex-vitamin c-folic acid (NEPHRO-VITE) 0.8 MG TABS tablet   Oral   Take 1 tablet by mouth daily.         . bisacodyl (DULCOLAX) 5 MG EC tablet   Oral   Take 1 tablet (5 mg total) by mouth daily as needed for moderate constipation.   30 tablet   0   . cholecalciferol 400 units tablet   Oral   Take 1 tablet (400 Units total) by mouth daily.   30 each   0   . citalopram (CELEXA) 20 MG tablet   Oral   Take 20 mg by mouth daily.         . clopidogrel (PLAVIX) 75 MG tablet   Oral   Take 75 mg by mouth daily.         Marland Kitchen glucose 4 GM chewable tablet   Oral   Chew 3-4 tablets by mouth as needed for low blood sugar (if pts blood sugar is less than 60).         Marland Kitchen guaiFENesin (MUCINEX) 600 MG 12 hr tablet   Oral   Take 1 tablet (600 mg total) by mouth 2 (two) times daily.   20 tablet   0   . HYDROcodone-acetaminophen (NORCO/VICODIN) 5-325 MG tablet   Oral   Take 1 tablet by mouth  every 6 (six) hours as needed for moderate pain.   30 tablet   0   . lipase/protease/amylase (CREON) 12000 UNITS CPEP capsule   Oral   Take 12,000 Units by mouth 3 (three) times daily with meals.          . nitroGLYCERIN (NITROSTAT) 0.4 MG SL tablet   Sublingual   Place 0.4 mg under the tongue every 5 (five) minutes x 3 doses as needed for chest  pain. *Max 3 doses per episode*         . pantoprazole (PROTONIX) 40 MG tablet   Oral   Take 40 mg by mouth daily.         . predniSONE (DELTASONE) 10 MG tablet   Oral   Take 1 tablet (10 mg total) by mouth daily with breakfast.   30 tablet   0   . promethazine (PHENERGAN) 12.5 MG tablet      1-2 tablets by mouth every 8 hours as needed for nausea and vomiting Patient taking differently: 12.5 mg every 6 (six) hours as needed for nausea or vomiting. 1-2 tablets by mouth every 8 hours as needed for nausea and vomiting   20 tablet   0   . sevelamer carbonate (RENVELA) 800 MG tablet   Oral   Take 800 mg by mouth 3 (three) times daily with meals.          . vitamin B-12 (CYANOCOBALAMIN) 1000 MCG tablet   Oral   Take 1,000 mcg by mouth daily.           Allergies Compazine; Ace inhibitors; Ativan; Codeine; Gabapentin; Losartan; Ondansetron; Prochlorperazine; Scopolamine; Zofran; Oxycodone; and Tape  Family History  Problem Relation Age of Onset  . Kidney disease Mother   . Diabetes Mother     Social History Social History  Substance Use Topics  . Smoking status: Current Some Day Smoker -- 0.50 packs/day    Types: Cigarettes    Last Attempt to Quit: 02/13/2015  . Smokeless tobacco: Never Used  . Alcohol Use: No    Review of Systems Constitutional: No fever/chills Eyes: No visual changes. ENT: No sore throat. Cardiovascular: Denies chest pain. Respiratory: Denies shortness of breath. Gastrointestinal: + abdominal pain.  + nausea, no vomiting.  No diarrhea.  No constipation. Genitourinary: Negative for  dysuria. Musculoskeletal: Negative for back pain. Skin: Negative for rash. Neurological: Negative for headaches, focal weakness or numbness.  10-point ROS otherwise negative.  ____________________________________________   PHYSICAL EXAM:  VITAL SIGNS: ED Triage Vitals  Enc Vitals Group     BP 12/17/15 0738 197/104 mmHg     Pulse Rate 12/17/15 0734 60     Resp 12/17/15 0734 23     Temp 12/17/15 0734 97.6 F (36.4 C)     Temp Source 12/17/15 0734 Oral     SpO2 12/17/15 0734 100 %     Weight 12/17/15 0734 140 lb (63.504 kg)     Height 12/17/15 0734 5\' 5"  (1.651 m)     Head Cir --      Peak Flow --      Pain Score 12/17/15 0738 9     Pain Loc --      Pain Edu? --      Excl. in [place]? --     Constitutional: Alert and oriented. In distress secondary to pain. Eyes: Conjunctivae are normal. PERRL. EOMI. Head: Atraumatic. Nose: No congestion/rhinnorhea. Mouth/Throat: Mucous membranes are moist.  Oropharynx non-erythematous. Neck: No stridor. Supple without meningismus. Cardiovascular: Normal rate, regular rhythm. Grossly normal heart sounds.  Good peripheral circulation. Respiratory: Normal respiratory effort.  No retractions. Lungs CTAB. Gastrointestinal: Soft with tenderness in the epigastrium and the right upper quadrant. No CVA tenderness. Genitourinary: deferred Musculoskeletal: No lower extremity tenderness nor edema.  No joint effusions. Neurologic:  Normal speech and language. No gross focal neurologic deficits are appreciated. No gait instability. Skin:  Skin is warm, dry and intact. No rash noted. Psychiatric: Mood and  affect are normal. Speech and behavior are normal.  ____________________________________________   LABS (all labs ordered are listed, but only abnormal results are displayed)  Labs Reviewed  COMPREHENSIVE METABOLIC PANEL - Abnormal; Notable for the following:    Sodium 130 (*)    Potassium 5.9 (*)    Chloride 94 (*)    BUN 60 (*)    Creatinine,  Ser 5.09 (*)    Calcium 8.6 (*)    Albumin 2.7 (*)    Alkaline Phosphatase 147 (*)    GFR calc non Af Amer 9 (*)    GFR calc Af Amer 10 (*)    All other components within normal limits  LIPASE, BLOOD  CBC WITH DIFFERENTIAL/PLATELET   ____________________________________________  EKG  ED ECG REPORT I, Joanne Gavel, the attending physician, personally viewed and interpreted this ECG.   Date: 12/17/2015  EKG Time: 07:30  Rate: 60  Rhythm: normal sinus rhythm  Axis: normal  Intervals: Nonspecific interventricular conduction delay.  ST&T Change: No acute ST elevation. EKG unchanged from 11/12/2015. ____________________________________________  RADIOLOGY  3 view abdominal plain films  IMPRESSION: Diffuse stool throughout colon. No bowel obstruction or free air. Evidence of a degree of congestive heart failure. ____________________________________________   PROCEDURES  Procedure(s) performed: None  Critical Care performed: No  ____________________________________________   INITIAL IMPRESSION / ASSESSMENT AND PLAN / ED COURSE  Pertinent labs & imaging results that were available during my care of the patient were reviewed by me and considered in my medical decision making (see chart for details).  [name] is a 55 y.o. female with CAD, DM, HTN, ESRD on dialysis dialyzed MWF, recurrent episodes of right upper quadrant/epigastric abdominal pain and nausea vomiting related to gastroparesis, also with history of pancreatitis who presents for evaluation of right upper quadrant pain and nausea, consistent with her usual gastroparesis flares. On exam, she is in distress secondary to pain. She is nauseated and spitting into a bag at bedside however she has not vomited since arrival to the ER. She is hypertensive at 197/104 however she has end-stage renal disease disease and did not receive dialysis today. She is also tachypneic which I suspect is pain related. The remainder of  her vital signs are stable, she is afebrile. We'll treat her pain. She reports that dilaudid and Phenergan are typically helpful. We'll obtain screening labs, EKG and reassess for disposition.  ----------------------------------------- 11:19 AM on 12/17/2015 -----------------------------------------  Patient resting comfortable at this time with improvement of her pain and nausea. Still no vomiting since arrival to the ER. Labs reviewed and CMP is notable for hyperkalemia with potassium 5.9 elevated creatinine at 5.09 is expected in the setting of her end-stage renal disease however typically, her potassium is much lower. She has no acute hyperkalemic EKG changes. Additionally, chest x-ray shows interstitial edema/volume overloaded. I discussed the case with nephrology, Dr. Sherlon Handing who recommends admission and he is already arranging for her to be dialyzed upstairs. Lipase unremarkable. Plain films negative. Case discussed with hospitalist, Dr. Posey Pronto, for admission. ____________________________________________   FINAL CLINICAL IMPRESSION(S) / ED DIAGNOSES  Final diagnoses:  Right upper quadrant pain  Nausea  Acute hyperkalemia  Congestive heart failure, unspecified congestive heart failure chronicity, unspecified congestive heart failure type (HCC)      Joanne Gavel, MD 12/17/15 1124

## 2015-12-17 NOTE — H&P (Signed)
[facility] NAME: [name]    MR#:  [id number]  DATE OF BIRTH:  [birth year]  DATE OF ADMISSION:  12/17/2015  PRIMARY CARE PHYSICIAN: Ellamae Sia, MD   REQUESTING/REFERRING PHYSICIAN:   CHIEF COMPLAINT:   Chief Complaint  Patient presents with  . Abdominal Pain  . Nausea    HISTORY OF PRESENT ILLNESS: [name]  is a 55 y.o. female with a known history of recurrent admission for nausea vomiting and right upper quadrant abdominal pain felt to be due to gastroparesis was presenting with same symptoms. Patient was recently in the hospital with pneumonia and her recurrent abdominal pain. She describes the pain as sharp in the right upper quadrant. Associated with nausea and vomiting bilish material. There is no diarrhea. Patient is supposed to have dialysis today show blood pressure was elevated in the emergency room as well as her potassium was elevated as well. She denies any chest pains or shortness of breath.     PAST MEDICAL HISTORY:   Past Medical History  Diagnosis Date  . Asthma   . Collagen vascular disease ([place])   . Diabetes mellitus without complication ([place])   . Hypertension   . Coronary artery disease     a. cath 2013: stenting to RCA (report not available); b. cath 2014: LM nl, pLAD 40%, mLAD nl, ost LCx 40%, mid LCx nl, pRCA 30% @ site of prior stent, mRCA 50%  . Diabetic neuropathy ([place])   . ESRD (end stage renal disease) on dialysis ([place])     M-W-F  . GERD (gastroesophageal reflux disease)   . Hx of pancreatitis 2015  . Myocardial infarction ([place])   . Pneumonia   . Mitral regurgitation     a. echo 10/2013: EF 62%, noWMA, mildly dilated LA, mild to mod MR/TR, GR1DD  . COPD (chronic obstructive pulmonary disease) ([place])   . chronic diastolic CHF [id number]  . dialysis 2006  . Depression   . Anxiety   . Arthritis   . Headache   . Anemia   . Renal insufficiency     PAST SURGICAL HISTORY: Past  Surgical History  Procedure Laterality Date  . Cholecystectomy    . Appendectomy    . Abdominal hysterectomy    . Dialysis fistula creation Left     upper arm  . Esophagogastroduodenoscopy N/A 03/08/2015    Procedure: ESOPHAGOGASTRODUODENOSCOPY (EGD);  Surgeon: Manya Silvas, MD;  Location[facility] ENDOSCOPY;  Service: Endoscopy;  Laterality: N/A;  . Cardiac catheterization Left 07/26/2015    Procedure: Left Heart Cath and Coronary Angiography;  Surgeon: Dionisio David, MD;  Location: [facility] CV LAB;  Service: Cardiovascular;  Laterality: Left;  Marland Kitchen Eye surgery    . Fecal transplant N/A 08/23/2015    Procedure: FECAL TRANSPLANT;  Surgeon: Manya Silvas, MD;  Location: [facility] ENDOSCOPY;  Service: Endoscopy;  Laterality: N/A;    SOCIAL HISTORY:  Social History  Substance Use Topics  . Smoking status: Current Some Day Smoker -- 0.50 packs/day    Types: Cigarettes    Last Attempt to Quit: 02/13/2015  . Smokeless tobacco: Never Used  . Alcohol Use: No    FAMILY HISTORY:  Family History  Problem Relation Age of Onset  . Kidney disease Mother   . Diabetes Mother     DRUG ALLERGIES:  Allergies  Allergen Reactions  . Compazine [Prochlorperazine Edisylate] Anaphylaxis and Nausea And Vomiting  . Ace Inhibitors Swelling  .  Ativan [Lorazepam] Other (See Comments)    Reaction:  Hallucinations and headaches  . Codeine Nausea And Vomiting  . Gabapentin Other (See Comments)    Reaction:  Unknown   . Losartan Other (See Comments)    Reaction:  Unknown   . Ondansetron Other (See Comments)    Reaction:  Unknown   . Prochlorperazine Other (See Comments)    Reaction:  Unknown   . Scopolamine Other (See Comments)    Reaction:  Unknown   . Zofran [Ondansetron Hcl] Other (See Comments)    Reaction:  hallucinations   . Oxycodone Anxiety  . Tape Rash    REVIEW OF SYSTEMS:   CONSTITUTIONAL: No fever, fatigue or weakness.  EYES: No blurred or double vision.  EARS, NOSE, AND THROAT:  No tinnitus or ear pain.  RESPIRATORY: No cough, shortness of breath, wheezing or hemoptysis.  CARDIOVASCULAR: No chest pain, orthopnea, edema.  GASTROINTESTINAL: Positive nausea and vomiting, no diarrhea or abdominal pain.  GENITOURINARY: No dysuria, hematuria.  ENDOCRINE: No polyuria, nocturia,  HEMATOLOGY: No anemia, easy bruising or bleeding SKIN: No rash or lesion. MUSCULOSKELETAL: No joint pain or arthritis.   NEUROLOGIC: No tingling, numbness, weakness.  PSYCHIATRY: No anxiety or depression.   MEDICATIONS AT HOME:  Prior to Admission medications   Medication Sig Start Date End Date Taking? Authorizing Provider  acidophilus (RISAQUAD) CAPS capsule Take 1 capsule by mouth daily.    Historical Provider, MD  albuterol (PROVENTIL) (2.5 MG/3ML) 0.083% nebulizer solution Take 2.5 mg by nebulization every 4 (four) hours as needed for wheezing or shortness of breath.    Historical Provider, MD  amiodarone (PACERONE) 200 MG tablet Take 1 tablet (200 mg total) by mouth daily. 12/01/15   Vaughan Basta, MD  aspirin EC 81 MG tablet Take 81 mg by mouth daily.    Historical Provider, MD  atorvastatin (LIPITOR) 20 MG tablet Take 20 mg by mouth every evening.    Historical Provider, MD  b complex-vitamin c-folic acid (NEPHRO-VITE) 0.8 MG TABS tablet Take 1 tablet by mouth daily.    Historical Provider, MD  bisacodyl (DULCOLAX) 5 MG EC tablet Take 1 tablet (5 mg total) by mouth daily as needed for moderate constipation. 12/01/15   Vaughan Basta, MD  cholecalciferol 400 units tablet Take 1 tablet (400 Units total) by mouth daily. 12/01/15   Vaughan Basta, MD  citalopram (CELEXA) 20 MG tablet Take 20 mg by mouth daily.    Historical Provider, MD  clopidogrel (PLAVIX) 75 MG tablet Take 75 mg by mouth daily.    Historical Provider, MD  glucose 4 GM chewable tablet Chew 3-4 tablets by mouth as needed for low blood sugar (if pts blood sugar is less than 60).    Historical Provider, MD   guaiFENesin (MUCINEX) 600 MG 12 hr tablet Take 1 tablet (600 mg total) by mouth 2 (two) times daily. 12/01/15   Vaughan Basta, MD  HYDROcodone-acetaminophen (NORCO/VICODIN) 5-325 MG tablet Take 1 tablet by mouth every 6 (six) hours as needed for moderate pain. 12/01/15   Vaughan Basta, MD  lipase/protease/amylase (CREON) 12000 UNITS CPEP capsule Take 12,000 Units by mouth 3 (three) times daily with meals.     Historical Provider, MD  nitroGLYCERIN (NITROSTAT) 0.4 MG SL tablet Place 0.4 mg under the tongue every 5 (five) minutes x 3 doses as needed for chest pain. *Max 3 doses per episode*    Historical Provider, MD  pantoprazole (PROTONIX) 40 MG tablet Take 40 mg by mouth daily.  Historical Provider, MD  predniSONE (DELTASONE) 10 MG tablet Take 1 tablet (10 mg total) by mouth daily with breakfast. 12/01/15   Vaughan Basta, MD  promethazine (PHENERGAN) 12.5 MG tablet 1-2 tablets by mouth every 8 hours as needed for nausea and vomiting Patient taking differently: 12.5 mg every 6 (six) hours as needed for nausea or vomiting. 1-2 tablets by mouth every 8 hours as needed for nausea and vomiting 10/22/15   Eula Listen, MD  sevelamer carbonate (RENVELA) 800 MG tablet Take 800 mg by mouth 3 (three) times daily with meals.     Historical Provider, MD  vitamin B-12 (CYANOCOBALAMIN) 1000 MCG tablet Take 1,000 mcg by mouth daily.    Historical Provider, MD      PHYSICAL EXAMINATION:   VITAL SIGNS: Blood pressure 190/95, pulse 60, temperature 97.6 F (36.4 C), temperature source Oral, resp. rate 20, height 5\' 5"  (1.651 m), weight 63.504 kg (140 lb), SpO2 96 %.  GENERAL:  55 y.o.-year-old patient lying in the bed mild distress due to her pain EYES: Pupils equal, round, reactive to light and accommodation. No scleral icterus. Extraocular muscles intact.  HEENT: Head atraumatic, normocephalic. Oropharynx and nasopharynx clear.  NECK:  Supple, no jugular venous distention. No  thyroid enlargement, no tenderness.  LUNGS: Normal breath sounds bilaterally, no wheezing, rales,rhonchi or crepitation. No use of accessory muscles of respiration.  CARDIOVASCULAR: S1, S2 normal. No murmurs, rubs, or gallops.  ABDOMEN: Soft, right upper quadrant abdominal pain, nondistended. Bowel sounds present. No organomegaly or mass.  EXTREMITIES: No pedal edema, cyanosis, or clubbing.  NEUROLOGIC: Cranial nerves II through XII are intact. Muscle strength 5/5 in all extremities. Sensation intact. Gait not checked.  PSYCHIATRIC: The patient is alert and oriented x 3.  SKIN: No obvious rash, lesion, or ulcer.   LABORATORY PANEL:   CBC  Recent Labs Lab 12/17/15 0942  WBC 6.9  HGB 9.5*  HCT 29.4*  PLT 343  MCV 85.3  MCH 27.6  MCHC 32.3  RDW 26.0*  LYMPHSABS 2.3  MONOABS 0.6  EOSABS 0.2  BASOSABS 0.0   ------------------------------------------------------------------------------------------------------------------  Chemistries   Recent Labs Lab 12/17/15 0942  NA 130*  K 5.9*  CL 94*  CO2 26  GLUCOSE 95  BUN 60*  CREATININE 5.09*  CALCIUM 8.6*  AST 18  ALT 15  ALKPHOS 147*  BILITOT 1.0   ------------------------------------------------------------------------------------------------------------------ estimated creatinine clearance is 11.1 mL/min (by C-G formula based on Cr of 5.09). ------------------------------------------------------------------------------------------------------------------ No results for input(s): TSH, T4TOTAL, T3FREE, THYROIDAB in the last 72 hours.  Invalid input(s): FREET3   Coagulation profile No results for input(s): INR, PROTIME in the last 168 hours. ------------------------------------------------------------------------------------------------------------------- No results for input(s): DDIMER in the last 72  hours. -------------------------------------------------------------------------------------------------------------------  Cardiac Enzymes No results for input(s): CKMB, TROPONINI, MYOGLOBIN in the last 168 hours.  Invalid input(s): CK ------------------------------------------------------------------------------------------------------------------ Invalid input(s): POCBNP  ---------------------------------------------------------------------------------------------------------------  Urinalysis    Component Value Date/Time   COLORURINE YELLOW* 08/31/2015 [place] 09/15/2014 0947   APPEARANCEUR CLEAR* 08/31/2015 1240   APPEARANCEUR Clear 09/15/2014 0947   LABSPEC 1.009 08/31/2015 1240   LABSPEC 1.008 09/15/2014 0947   PHURINE 9.0* 08/31/2015 1240   PHURINE 9.0 09/15/2014 0947   GLUCOSEU NEGATIVE 08/31/2015 1240   GLUCOSEU 150 mg/dL 09/15/2014 0947   HGBUR NEGATIVE 08/31/2015 1240   HGBUR Negative 09/15/2014 [place] 08/31/2015 1240   BILIRUBINUR Negative 09/15/2014 [place] 08/31/2015 1240   KETONESUR Negative 09/15/2014 0947   PROTEINUR  100* 08/31/2015 1240   PROTEINUR 100 mg/dL 09/15/2014 0947   UROBILINOGEN 0.2 06/30/2010 2130   NITRITE NEGATIVE 08/31/2015 1240   NITRITE Negative 09/15/2014 0947   LEUKOCYTESUR TRACE* 08/31/2015 1240   LEUKOCYTESUR Negative 09/15/2014 0947     RADIOLOGY: Dg Abd Acute W/chest  12/17/2015  CLINICAL DATA:  Nausea and abdominal pain.  Chronic renal failure. EXAM: DG ABDOMEN ACUTE W/ 1V CHEST COMPARISON:  Chest radiograph November 28, 2015; abdominal radiograph November 17, 2015 FINDINGS: PA chest: There are bilateral pleural effusions with bibasilar atelectasis. There is mild cardiomegaly with pulmonary venous hypertension. No frank edema. There is a stent in the subclavian and axillary regions on the left. Supine and upright abdomen: There is diffuse stool throughout colon. There is no bowel  dilatation or air-fluid level suggesting obstruction. No free air. There is extensive vascular calcification. There are surgical clips in right upper quadrant region. IMPRESSION: Diffuse stool throughout colon. No bowel obstruction or free air. Evidence of a degree of congestive heart failure. Electronically Signed   By: Lowella Grip III M.D.   On: 12/17/2015 10:27    EKG: Orders placed or performed during the hospital encounter of 11/12/15  . ED EKG  . ED EKG  . EKG 12-Lead  . EKG 12-Lead  . EKG 12-Lead  . EKG 12-Lead  . EKG 12-Lead  . EKG 12-Lead    IMPRESSION AND PLAN:  Patient is a 56 year old African-American female with end-stage renal disease and gastroparesis presents with abdominal pain, nausea vomiting  1. Abdominal pain due to gastroparesis flare- patient is allergic to most of antiemetics. At this point I will place her on scheduled Reglan pain control  2. Accelerated hypertension likely related to pain I will place patient on hydralazine IV when necessary  3. Diabetes type 2 place her on sliding scale insulin  4. End-stage renal disease nephrologist has been contacted- patient will be dialyzed today  5. Coronary artery disease continue Plavix  6. Hyperlipidemia continue atorvastatin  7. Miscellaneous heparin for DVT prophylaxis  All the records are reviewed and case discussed with ED provider. Management plans discussed with the patient, family and they are in agreement.  CODE STATUS:    Code Status Orders        Start     Ordered   12/17/15 1139  Full code   Continuous     12/17/15 1139    Code Status History    Date Active Date Inactive Code Status Order ID Comments User Context   11/12/2015  4:02 PM 12/01/2015  7:43 PM Full Code [id number]  Bettey Costa, MD Inpatient   10/07/2015  9:37 AM 10/12/2015  7:59 PM Full Code [id number]  Fritzi Mandes, MD ED   10/03/2015  3:55 AM 10/05/2015 10:30 PM Full Code [id number]  Lance Coon, MD Inpatient   08/31/2015  4:00  PM 09/03/2015 10:09 PM Full Code [id number]  Demetrios Loll, MD ED   07/26/2015  3:28 PM 07/26/2015  9:36 PM Full Code [id number]  Dionisio David, MD Inpatient   07/22/2015  5:36 PM 07/26/2015  3:28 PM Full Code [id number]  Vaughan Basta, MD Inpatient   06/05/2015  8:39 PM 06/07/2015  8:03 PM Full Code [id number]  Bettey Costa, MD Inpatient   05/21/2015  9:42 PM 05/24/2015  4:26 PM Full Code [id number]  Henreitta Leber, MD Inpatient   04/24/2015  8:55 AM 04/26/2015 10:11 PM Full Code [id number]  Demetrios Loll, MD Inpatient   04/01/2015  3:58 PM 04/05/2015  8:07 PM Full Code [id number]  Epifanio Lesches, MD ED   03/06/2015  8:05 AM 03/09/2015  9:11 PM Full Code [id number]  Juluis Mire, MD Inpatient   02/19/2015  4:14 PM 02/24/2015  5:41 PM Full Code [id number]  Aldean Jewett, MD Inpatient    Advance Directive Documentation        Most Recent Value   Type of Advance Directive  Healthcare Power of [place], Living will   Pre-existing out of facility DNR order (yellow form or pink MOST form)     "MOST" Form in Place?         TOTAL TIME TAKING CARE OF THIS PATIENT: 28minutes.    Dustin Flock M.D on 12/17/2015 at 11:45 AM  Between 7am to 6pm - Pager - [phone]  After 6pm go to [email or web address] - password [email or web address] [facility]  Office  [phone]  CC: Primary care physician; Ellamae Sia, MD

## 2015-12-17 NOTE — Progress Notes (Signed)
Post dialysis assessment 

## 2015-12-18 ENCOUNTER — Encounter: Payer: Self-pay | Admitting: *Deleted

## 2015-12-18 ENCOUNTER — Inpatient Hospital Stay: Payer: Medicare Other | Admitting: Internal Medicine

## 2015-12-18 DIAGNOSIS — Z794 Long term (current) use of insulin: Secondary | ICD-10-CM | POA: Diagnosis not present

## 2015-12-18 DIAGNOSIS — Z7902 Long term (current) use of antithrombotics/antiplatelets: Secondary | ICD-10-CM | POA: Diagnosis not present

## 2015-12-18 DIAGNOSIS — Y832 Surgical operation with anastomosis, bypass or graft as the cause of abnormal reaction of the patient, or of later complication, without mention of misadventure at the time of the procedure: Secondary | ICD-10-CM | POA: Diagnosis present

## 2015-12-18 DIAGNOSIS — K219 Gastro-esophageal reflux disease without esophagitis: Secondary | ICD-10-CM | POA: Diagnosis present

## 2015-12-18 DIAGNOSIS — D638 Anemia in other chronic diseases classified elsewhere: Secondary | ICD-10-CM | POA: Diagnosis present

## 2015-12-18 DIAGNOSIS — R112 Nausea with vomiting, unspecified: Secondary | ICD-10-CM | POA: Diagnosis present

## 2015-12-18 DIAGNOSIS — N2889 Other specified disorders of kidney and ureter: Secondary | ICD-10-CM | POA: Diagnosis present

## 2015-12-18 DIAGNOSIS — E1143 Type 2 diabetes mellitus with diabetic autonomic (poly)neuropathy: Secondary | ICD-10-CM | POA: Diagnosis present

## 2015-12-18 DIAGNOSIS — J45909 Unspecified asthma, uncomplicated: Secondary | ICD-10-CM | POA: Diagnosis present

## 2015-12-18 DIAGNOSIS — N2581 Secondary hyperparathyroidism of renal origin: Secondary | ICD-10-CM | POA: Diagnosis present

## 2015-12-18 DIAGNOSIS — E785 Hyperlipidemia, unspecified: Secondary | ICD-10-CM | POA: Diagnosis present

## 2015-12-18 DIAGNOSIS — E875 Hyperkalemia: Secondary | ICD-10-CM | POA: Diagnosis present

## 2015-12-18 DIAGNOSIS — E11319 Type 2 diabetes mellitus with unspecified diabetic retinopathy without macular edema: Secondary | ICD-10-CM | POA: Diagnosis present

## 2015-12-18 DIAGNOSIS — Z888 Allergy status to other drugs, medicaments and biological substances status: Secondary | ICD-10-CM | POA: Diagnosis not present

## 2015-12-18 DIAGNOSIS — I252 Old myocardial infarction: Secondary | ICD-10-CM | POA: Diagnosis not present

## 2015-12-18 DIAGNOSIS — R1011 Right upper quadrant pain: Secondary | ICD-10-CM | POA: Diagnosis present

## 2015-12-18 DIAGNOSIS — E1122 Type 2 diabetes mellitus with diabetic chronic kidney disease: Secondary | ICD-10-CM | POA: Diagnosis present

## 2015-12-18 DIAGNOSIS — M358 Other specified systemic involvement of connective tissue: Secondary | ICD-10-CM | POA: Diagnosis present

## 2015-12-18 DIAGNOSIS — Z7951 Long term (current) use of inhaled steroids: Secondary | ICD-10-CM | POA: Diagnosis not present

## 2015-12-18 DIAGNOSIS — Z885 Allergy status to narcotic agent status: Secondary | ICD-10-CM | POA: Diagnosis not present

## 2015-12-18 DIAGNOSIS — K3184 Gastroparesis: Secondary | ICD-10-CM | POA: Diagnosis present

## 2015-12-18 DIAGNOSIS — D631 Anemia in chronic kidney disease: Secondary | ICD-10-CM | POA: Diagnosis present

## 2015-12-18 DIAGNOSIS — N186 End stage renal disease: Secondary | ICD-10-CM | POA: Diagnosis present

## 2015-12-18 DIAGNOSIS — T82818A Embolism of vascular prosthetic devices, implants and grafts, initial encounter: Secondary | ICD-10-CM | POA: Diagnosis present

## 2015-12-18 DIAGNOSIS — Z79899 Other long term (current) drug therapy: Secondary | ICD-10-CM | POA: Diagnosis not present

## 2015-12-18 DIAGNOSIS — I2510 Atherosclerotic heart disease of native coronary artery without angina pectoris: Secondary | ICD-10-CM | POA: Diagnosis present

## 2015-12-18 DIAGNOSIS — F419 Anxiety disorder, unspecified: Secondary | ICD-10-CM | POA: Diagnosis present

## 2015-12-18 DIAGNOSIS — Z992 Dependence on renal dialysis: Secondary | ICD-10-CM | POA: Diagnosis not present

## 2015-12-18 DIAGNOSIS — I5033 Acute on chronic diastolic (congestive) heart failure: Secondary | ICD-10-CM | POA: Diagnosis present

## 2015-12-18 DIAGNOSIS — I132 Hypertensive heart and chronic kidney disease with heart failure and with stage 5 chronic kidney disease, or end stage renal disease: Secondary | ICD-10-CM | POA: Diagnosis present

## 2015-12-18 DIAGNOSIS — Z66 Do not resuscitate: Secondary | ICD-10-CM | POA: Diagnosis present

## 2015-12-18 DIAGNOSIS — Z7952 Long term (current) use of systemic steroids: Secondary | ICD-10-CM | POA: Diagnosis not present

## 2015-12-18 DIAGNOSIS — J440 Chronic obstructive pulmonary disease with acute lower respiratory infection: Secondary | ICD-10-CM | POA: Diagnosis present

## 2015-12-18 DIAGNOSIS — I998 Other disorder of circulatory system: Secondary | ICD-10-CM | POA: Diagnosis present

## 2015-12-18 DIAGNOSIS — Z7982 Long term (current) use of aspirin: Secondary | ICD-10-CM | POA: Diagnosis not present

## 2015-12-18 DIAGNOSIS — F17210 Nicotine dependence, cigarettes, uncomplicated: Secondary | ICD-10-CM | POA: Diagnosis present

## 2015-12-18 DIAGNOSIS — F329 Major depressive disorder, single episode, unspecified: Secondary | ICD-10-CM | POA: Diagnosis present

## 2015-12-18 DIAGNOSIS — I340 Nonrheumatic mitral (valve) insufficiency: Secondary | ICD-10-CM | POA: Diagnosis present

## 2015-12-18 DIAGNOSIS — J189 Pneumonia, unspecified organism: Secondary | ICD-10-CM | POA: Diagnosis present

## 2015-12-18 LAB — DIFFERENTIAL
Basophils Absolute: 0.1 10*3/uL (ref 0–0.1)
Basophils Relative: 1 %
Eosinophils Absolute: 0 10*3/uL (ref 0–0.7)
Eosinophils Relative: 0 %
Lymphocytes Relative: 34 %
Lymphs Abs: 1.7 10*3/uL (ref 1.0–3.6)
Monocytes Absolute: 0.1 10*3/uL — ABNORMAL LOW (ref 0.2–0.9)
Monocytes Relative: 3 %
Neutro Abs: 3 10*3/uL (ref 1.4–6.5)
Neutrophils Relative %: 62 %

## 2015-12-18 LAB — BASIC METABOLIC PANEL
Anion gap: 12 (ref 5–15)
BUN: 65 mg/dL — ABNORMAL HIGH (ref 6–20)
CO2: 27 mmol/L (ref 22–32)
Calcium: 8.7 mg/dL — ABNORMAL LOW (ref 8.9–10.3)
Chloride: 92 mmol/L — ABNORMAL LOW (ref 101–111)
Creatinine, Ser: 5.9 mg/dL — ABNORMAL HIGH (ref 0.44–1.00)
GFR calc Af Amer: 8 mL/min — ABNORMAL LOW (ref >60)
GFR calc non Af Amer: 7 mL/min — ABNORMAL LOW (ref >60)
Glucose, Bld: 67 mg/dL (ref 65–99)
Potassium: 6.1 mmol/L — ABNORMAL HIGH (ref 3.5–5.1)
Sodium: 131 mmol/L — ABNORMAL LOW (ref 135–145)

## 2015-12-18 LAB — GLUCOSE, CAPILLARY
Glucose-Capillary: 67 mg/dL (ref 65–99)
Glucose-Capillary: 75 mg/dL (ref 65–99)
Glucose-Capillary: 113 mg/dL — ABNORMAL HIGH (ref 65–99)
Glucose-Capillary: 57 mg/dL — ABNORMAL LOW (ref 65–99)
Glucose-Capillary: 75 mg/dL (ref 65–99)

## 2015-12-18 LAB — CBC
HCT: 33.3 % — ABNORMAL LOW (ref 35.0–47.0)
HCT: 28.9 % — ABNORMAL LOW (ref 35.0–47.0)
Hemoglobin: 10.7 g/dL — ABNORMAL LOW (ref 12.0–16.0)
Hemoglobin: 9.4 g/dL — ABNORMAL LOW (ref 12.0–16.0)
MCH: 28 pg (ref 26.0–34.0)
MCH: 27.8 pg (ref 26.0–34.0)
MCHC: 32 g/dL (ref 32.0–36.0)
MCHC: 32.4 g/dL (ref 32.0–36.0)
MCV: 87.4 fL (ref 80.0–100.0)
MCV: 85.6 fL (ref 80.0–100.0)
Platelets: 321 10*3/uL (ref 150–440)
Platelets: 291 10*3/uL (ref 150–440)
RBC: 3.81 MIL/uL (ref 3.80–5.20)
RBC: 3.37 MIL/uL — ABNORMAL LOW (ref 3.80–5.20)
RDW: 25.5 % — ABNORMAL HIGH (ref 11.5–14.5)
RDW: 25.6 % — ABNORMAL HIGH (ref 11.5–14.5)
WBC: 5.7 10*3/uL (ref 3.6–11.0)
WBC: 4.9 10*3/uL (ref 3.6–11.0)

## 2015-12-18 MED ORDER — PROMETHAZINE HCL 25 MG PO TABS
12.5000 mg | ORAL_TABLET | Freq: Four times a day (QID) | ORAL | Status: DC | PRN
  Administered 2015-12-19 (×2): 12.5 mg via ORAL
  Filled 2015-12-18 (×2): qty 1

## 2015-12-18 MED ORDER — POLYETHYLENE GLYCOL 3350 17 G PO PACK
17.0000 g | PACK | Freq: Every day | ORAL | Status: DC
  Administered 2015-12-18 – 2015-12-20 (×2): 17 g via ORAL
  Filled 2015-12-18 (×2): qty 1

## 2015-12-18 MED ORDER — CHLORHEXIDINE GLUCONATE CLOTH 2 % EX PADS: 6.0000 | MEDICATED_PAD | Freq: Once | CUTANEOUS | Status: DC

## 2015-12-18 MED ORDER — SODIUM CHLORIDE 0.9 % IV SOLN
INTRAVENOUS | Status: DC
Start: 2015-12-18 — End: 2015-12-18

## 2015-12-18 MED ORDER — SODIUM POLYSTYRENE SULFONATE 15 GM/60ML PO SUSP
30.0000 g | Freq: Once | ORAL | Status: AC
  Administered 2015-12-18: 30 g via ORAL
  Filled 2015-12-18: qty 120

## 2015-12-18 MED ORDER — AMLODIPINE BESYLATE 10 MG PO TABS
10.0000 mg | ORAL_TABLET | Freq: Every day | ORAL | Status: DC
  Administered 2015-12-18 – 2015-12-20 (×3): 10 mg via ORAL
  Filled 2015-12-18 (×3): qty 1

## 2015-12-18 MED ORDER — DEXTROSE 50 % IV SOLN
25.0000 mL | Freq: Once | INTRAVENOUS | Status: AC
  Administered 2015-12-18: 25 mL via INTRAVENOUS
  Filled 2015-12-18: qty 50

## 2015-12-18 MED ORDER — MIDAZOLAM HCL 2 MG/2ML IJ SOLN
INTRAMUSCULAR | Status: AC
  Filled 2015-12-18: qty 2

## 2015-12-18 MED ORDER — CARVEDILOL 3.125 MG PO TABS
3.1250 mg | ORAL_TABLET | Freq: Two times a day (BID) | ORAL | Status: DC
  Administered 2015-12-18: 3.125 mg via ORAL
  Filled 2015-12-18: qty 1

## 2015-12-18 MED ORDER — INSULIN REGULAR HUMAN 100 UNIT/ML IJ SOLN
10.0000 [IU] | Freq: Once | INTRAMUSCULAR | Status: AC
  Administered 2015-12-18: 10 [IU] via INTRAVENOUS
  Filled 2015-12-18 (×2): qty 0.1

## 2015-12-18 MED ORDER — ONDANSETRON HCL 4 MG/2ML IJ SOLN
4.0000 mg | Freq: Four times a day (QID) | INTRAMUSCULAR | Status: DC | PRN
  Administered 2015-12-18 (×2): 4 mg via INTRAVENOUS
  Filled 2015-12-18 (×3): qty 2

## 2015-12-18 MED ORDER — LACTULOSE 10 GM/15ML PO SOLN
30.0000 g | Freq: Two times a day (BID) | ORAL | Status: DC
  Administered 2015-12-18 – 2015-12-20 (×3): 30 g via ORAL
  Filled 2015-12-18 (×4): qty 60

## 2015-12-18 NOTE — Progress Notes (Signed)
Pt nauseated, zofran and reglan not enough per pt and nurse.  Allergy to compazine and prochloperazine listed as anaphylaxis, but has phenergan as home med and states she has taken it without problem.  Will reorder home dose phenergan.  Jacqulyn Bath [facility] 12/18/2015, 11:54 PM

## 2015-12-18 NOTE — Progress Notes (Signed)
Hemodialysis started

## 2015-12-18 NOTE — Progress Notes (Signed)
Pt clinically stable post right groin temp perm cath placement, versed 1mg  iv given per orders, dr Delana Meyer placing line, pt had unusually large amt diarrhea stool, changing entire linen X2, report given with pt retuning to room

## 2015-12-18 NOTE — Op Note (Signed)
  OPERATIVE NOTE   PROCEDURE: 1. Insertion of temporary dialysis catheter catheter right femoral approach.  PRE-OPERATIVE DIAGNOSIS: Cuff condition of dialysis device with thrombosis of AV access; end-stage renal disease requiring hemodialysis; abdominal pain with nausea and vomiting  POST-OPERATIVE DIAGNOSIS: Same  SURGEON: Katha Cabal M.D.  ANESTHESIA: 1% lidocaine local infiltration  ESTIMATED BLOOD LOSS: Minimal cc  INDICATIONS:   [name] is a 55 y.o. female who presents with abdominal pain with nausea vomiting her fistula is thrombosed and she therefore requires urgent dialysis access. The risks and benefits of been reviewed all questions were answered patient agrees to proceed  DESCRIPTION: After obtaining full informed written consent, the patient was positioned supine. The right groin was prepped and draped in a sterile fashion. Ultrasound was placed in a sterile sleeve. Ultrasound was utilized to identify the right common femoral vein which is noted to be echolucent and compressible indicating patency. Images recorded for the permanent record. Under real-time visualization a Seldinger needle is inserted into the vein and the guidewires advanced without difficulty. Small counterincision was made at the wire insertion site. Dilator is passed over the wire and the temporary dialysis catheter catheter is fed over the wire without difficulty.  All lumens aspirate and flush easily and are packed with heparin saline. Catheter secured to the skin of the right thigh with 2-0 silk. A sterile dressing is applied with Biopatch.  COMPLICATIONS: None   CONDITION: Unchanged  Katha Cabal, M.D. [facility] Office:  [phone]

## 2015-12-18 NOTE — Progress Notes (Signed)
Post hd tx 

## 2015-12-18 NOTE — Progress Notes (Signed)
Pre-hd tx 

## 2015-12-18 NOTE — Consult Note (Signed)
[facility] Consult Note  MRN : [id number]  [name] is a 55 y.o. ([birth year]) female who presents with chief complaint of  Chief Complaint  Patient presents with  . Abdominal Pain  . Nausea  .  History of Present Illness: The patient is a 55 y.o. female well known to my service with a known history of recurrent admissions for nausea vomiting and right upper quadrant abdominal pain.  I am asked to evaluate her for dialysis access on an urgent basis by Dr. Juleen China.  In the past it was felt the pain was due to gastroparesis and her presenting symptoms yesterday were similar to the  presenting symptoms in the past.  Patient was recently in the hospital with pneumonia and her recurrent abdominal pain. She describes the pain as sharp in the right upper quadrant. Associated with nausea and vomiting bilish material. There is no diarrhea.   Patient is supposed to have dialysis today show blood pressure was elevated in the emergency room as well as her potassium was elevated as well. She denies any chest pains or shortness of breath. Unfortunately her dialysis access is noted to be thrombosed.  Current Facility-Administered Medications  Medication Dose Route Frequency Provider Last Rate Last Dose  . 0.9 %  sodium chloride infusion  250 mL Intravenous PRN Dustin Flock, MD      . 0.9 %  sodium chloride infusion   Intravenous Continuous Algernon Huxley, MD      . acetaminophen (TYLENOL) tablet 650 mg  650 mg Oral Q6H PRN Dustin Flock, MD       Or  . acetaminophen (TYLENOL) suppository 650 mg  650 mg Rectal Q6H PRN Dustin Flock, MD      . acidophilus (RISAQUAD) capsule 1 capsule  1 capsule Oral Daily Dustin Flock, MD   1 capsule at 12/18/15 0937  . albuterol (PROVENTIL) (2.5 MG/3ML) 0.083% nebulizer solution 2.5 mg  2.5 mg Nebulization Q4H PRN Dustin Flock, MD      . amiodarone (PACERONE) tablet 200 mg  200 mg Oral Daily Dustin Flock, MD   200 mg at 12/18/15  [id number]  . amLODipine (NORVASC) tablet 10 mg  10 mg Oral Daily Bettey Costa, MD   10 mg at 12/18/15 0937  . aspirin EC tablet 81 mg  81 mg Oral Daily Dustin Flock, MD   81 mg at 12/18/15 [id number]  . atorvastatin (LIPITOR) tablet 20 mg  20 mg Oral QPM Dustin Flock, MD   20 mg at 12/17/15 1751  . bisacodyl (DULCOLAX) EC tablet 5 mg  5 mg Oral Daily PRN Dustin Flock, MD      . carvedilol (COREG) tablet 3.125 mg  3.125 mg Oral BID WC Bettey Costa, MD   3.125 mg at 12/18/15 0801  . Chlorhexidine Gluconate Cloth 2 % PADS 6 each  6 each Topical Once Algernon Huxley, MD      . cholecalciferol (VITAMIN D) tablet 400 Units  400 Units Oral Daily Dustin Flock, MD   400 Units at 12/18/15 [phone]  . citalopram (CELEXA) tablet 20 mg  20 mg Oral Daily Dustin Flock, MD   20 mg at 12/18/15 [id number]  . clopidogrel (PLAVIX) tablet 75 mg  75 mg Oral Daily Dustin Flock, MD   75 mg at 12/18/15 [id number]  . glucose chewable tablet 12-16 g  3-4 tablet Oral PRN Dustin Flock, MD   16 g at 12/17/15 2250  . guaiFENesin (MUCINEX) 12 hr tablet 600 mg  600 mg Oral BID Dustin Flock, MD   600 mg at 12/18/15 [id number]  . heparin injection 5,000 Units  5,000 Units Subcutaneous 3 times per day Dustin Flock, MD   5,000 Units at 12/18/15 0600  . hydrALAZINE (APRESOLINE) injection 10 mg  10 mg Intravenous Q6H PRN Dustin Flock, MD      . HYDROcodone-acetaminophen (NORCO/VICODIN) 5-325 MG per tablet 1-2 tablet  1-2 tablet Oral Q4H PRN Dustin Flock, MD   2 tablet at 12/18/15 0157  . HYDROmorphone (DILAUDID) injection 0.5 mg  0.5 mg Intravenous Q4H PRN Dustin Flock, MD   0.5 mg at 12/18/15 1218  . insulin aspart (novoLOG) injection 0-9 Units  0-9 Units Subcutaneous TID WC Dustin Flock, MD   1 Units at 12/17/15 1751  . lactulose (CHRONULAC) 10 GM/15ML solution 30 g  30 g Oral BID Bettey Costa, MD   30 g at 12/18/15 1236  . lipase/protease/amylase (CREON) capsule 12,000 Units  12,000 Units Oral TID WC Dustin Flock, MD   12,000 Units at 12/18/15 1219   . metoCLOPramide (REGLAN) injection 10 mg  10 mg Intravenous 3 times per day Dustin Flock, MD   10 mg at 12/18/15 0933  . midazolam (VERSED) 2 MG/2ML injection           . multivitamin (RENA-VIT) tablet 1 tablet  1 tablet Oral QHS Dustin Flock, MD   1 tablet at 12/17/15 2218  . nitroGLYCERIN (NITROSTAT) SL tablet 0.4 mg  0.4 mg Sublingual Q5 Min x 3 PRN Dustin Flock, MD      . ondansetron (ZOFRAN) injection 4 mg  4 mg Intravenous Q6H PRN Harrie Foreman, MD   4 mg at 12/18/15 1235  . pantoprazole (PROTONIX) EC tablet 40 mg  40 mg Oral Daily Dustin Flock, MD   40 mg at 12/18/15 [id number]  . polyethylene glycol (MIRALAX / GLYCOLAX) packet 17 g  17 g Oral Daily Bettey Costa, MD   17 g at 12/18/15 1238  . predniSONE (DELTASONE) tablet 10 mg  10 mg Oral Q breakfast Dustin Flock, MD   10 mg at 12/18/15 0800  . sevelamer carbonate (RENVELA) tablet 800 mg  800 mg Oral TID WC Dustin Flock, MD   800 mg at 12/18/15 1219  . sodium chloride flush (NS) 0.9 % injection 3 mL  3 mL Intravenous Q12H Dustin Flock, MD   3 mL at 12/18/15 0934  . sodium chloride flush (NS) 0.9 % injection 3 mL  3 mL Intravenous PRN Dustin Flock, MD      . vitamin B-12 (CYANOCOBALAMIN) tablet 1,000 mcg  1,000 mcg Oral Daily Dustin Flock, MD   1,000 mcg at 12/18/15 [id number]    Past Medical History  Diagnosis Date  . Asthma   . Collagen vascular disease ([place])   . Diabetes mellitus without complication ([place])   . Hypertension   . Coronary artery disease     a. cath 2013: stenting to RCA (report not available); b. cath 2014: LM nl, pLAD 40%, mLAD nl, ost LCx 40%, mid LCx nl, pRCA 30% @ site of prior stent, mRCA 50%  . Diabetic neuropathy ([place])   . ESRD (end stage renal disease) on dialysis ([place])     M-W-F  . GERD (gastroesophageal reflux disease)   . Hx of pancreatitis 2015  . Myocardial infarction ([place])   . Pneumonia   . Mitral regurgitation     a. echo 10/2013: EF 62%, noWMA, mildly dilated LA, mild to mod MR/TR, GR1DD   .  COPD (chronic obstructive pulmonary disease) ([place])   . chronic diastolic CHF [id number]  . dialysis 2006  . Depression   . Anxiety   . Arthritis   . Headache   . Anemia   . Renal insufficiency     Past Surgical History  Procedure Laterality Date  . Cholecystectomy    . Appendectomy    . Abdominal hysterectomy    . Dialysis fistula creation Left     upper arm  . Esophagogastroduodenoscopy N/A 03/08/2015    Procedure: ESOPHAGOGASTRODUODENOSCOPY (EGD);  Surgeon: Manya Silvas, MD;  Location[facility];  Service: Endoscopy;  Laterality: N/A;  . Cardiac catheterization Left 07/26/2015    Procedure: Left Heart Cath and Coronary Angiography;  Surgeon: Dionisio David, MD;  Location: [facility];  Service: Cardiovascular;  Laterality: Left;  Marland Kitchen Eye surgery    . Fecal transplant N/A 08/23/2015    Procedure: FECAL TRANSPLANT;  Surgeon: Manya Silvas, MD;  Location: [facility] ENDOSCOPY;  Service: Endoscopy;  Laterality: N/A;    Social History Social History  Substance Use Topics  . Smoking status: Former Smoker -- 0.50 packs/day    Types: Cigarettes    Quit date: 02/13/2015  . Smokeless tobacco: Never Used  . Alcohol Use: No    Family History Family History  Problem Relation Age of Onset  . Kidney disease Mother   . Diabetes Mother   No family history of porphyria, autoimmune disease or bleeding clotting disorders  Allergies  Allergen Reactions  . Compazine [Prochlorperazine Edisylate] Anaphylaxis and Nausea And Vomiting  . Ace Inhibitors Swelling  . Ativan [Lorazepam] Other (See Comments)    Reaction:  Hallucinations and headaches  . Codeine Nausea And Vomiting  . Gabapentin Other (See Comments)    Reaction:  Unknown   . Losartan Other (See Comments)    Reaction:  Unknown   . Ondansetron Other (See Comments)    Reaction:  Unknown   . Prochlorperazine Other (See Comments)    Reaction:  Unknown   . Scopolamine Other (See Comments)    Reaction:  Unknown    . Zofran [Ondansetron Hcl] Other (See Comments)    Reaction:  hallucinations   . Oxycodone Anxiety  . Tape Rash     REVIEW OF SYSTEMS (Negative unless checked)  Constitutional: [] Weight loss  [] Fever  [] Chills Cardiac: [] Chest pain   [] Chest pressure   [] Palpitations   [] Shortness of breath when laying flat   [] Shortness of breath at rest   [] Shortness of breath with exertion. Vascular:  [] Pain in legs with walking   [] Pain in legs at rest   [] Pain in legs when laying flat   [] Claudication   [] Pain in feet when walking  [] Pain in feet at rest  [] Pain in feet when laying flat   [] History of DVT   [] Phlebitis   [] Swelling in legs   [] Varicose veins   [] Non-healing ulcers Pulmonary:   [] Uses home oxygen   [] Productive cough   [] Hemoptysis   [] Wheeze  [] COPD   [] Asthma Neurologic:  [] Dizziness  [] Blackouts   [] Seizures   [] History of stroke   [] History of TIA  [] Aphasia   [] Temporary blindness   [] Dysphagia   [] Weakness or numbness in arms   [] Weakness or numbness in legs Musculoskeletal:  [] Arthritis   [] Joint swelling   [] Joint pain   [] Low back pain Hematologic:  [] Easy bruising  [] Easy bleeding   [] Hypercoagulable state   [] Anemic  [] Hepatitis Gastrointestinal:  [] Blood in stool   []   Vomiting blood  [x] Gastroesophageal reflux/heartburn   [] Difficulty swallowing. Genitourinary:  [x] Chronic kidney disease   [] Difficult urination  [] Frequent urination  [] Burning with urination   [] Blood in urine Skin:  [] Rashes   [] Ulcers   [] Wounds Psychological:  [] History of anxiety   []  History of major depression.  Physical Examination  Filed Vitals:   12/17/15 1858 12/17/15 2046 12/18/15 0429 12/18/15 1258  BP:  132/61 182/80 164/60  Pulse:  58 62 59  Temp:  98.4 F (36.9 C) 97.9 F (36.6 C) 97.9 F (36.6 C)  TempSrc:  Oral Oral Oral  Resp:  16 20 20   Height: 5\' 5"  (1.651 m)     Weight:      SpO2:  98% 100% 100%   Body mass index is 24.47 kg/(m^2). Gen:  WD/WN, NAD Head: Bartholomew/AT, No  temporalis wasting. Prominent temp pulse not noted. Ear/Nose/Throat: Hearing grossly intact, nares w/o erythema or drainage, oropharynx w/o Erythema/Exudate Eyes: PERRLA, EOMI.  Neck: Supple, no nuchal rigidity.  No bruit or JVD.  Pulmonary:  Good air movement, clear to auscultation bilaterally.  Cardiac: RRR, normal S1, S2, no Murmurs, rubs or gallops. Vascular: AV access no thrill no bruit consistent with thrombosis Vessel Right Left  Radial Palpable Palpable  Ulnar Not Palpable Not Palpable  Brachial Palpable Palpable  Carotid Palpable, without bruit Palpable, without bruit   Gastrointestinal: soft, non-tender/non-distended. No guarding/reflex. No masses, surgical incisions, or scars. Musculoskeletal: M/S 5/5 throughout.  Extremities without ischemic changes.  No deformity or atrophy. No edema. Neurologic: CN 2-12 intact. Pain and light touch intact in extremities.  Symmetrical.  Speech is fluent. Motor exam as listed above. Psychiatric: Judgment intact, Mood & affect appropriate for pt's clinical situation. Dermatologic: No rashes or ulcers noted.  No cellulitis or open wounds. Lymph : No Cervical, Axillary, or Inguinal lymphadenopathy.    CBC Lab Results  Component Value Date   WBC 5.7 12/18/2015   HGB 10.7* 12/18/2015   HCT 33.3* 12/18/2015   MCV 87.4 12/18/2015   PLT 321 12/18/2015    BMET    Component Value Date/Time   NA 131* 12/18/2015 0813   NA 135* 09/15/2014 0948   K 6.1* 12/18/2015 0813   K 4.8 09/15/2014 0948   CL 92* 12/18/2015 0813   CL 99 09/15/2014 0948   CO2 27 12/18/2015 0813   CO2 26 09/15/2014 0948   GLUCOSE 67 12/18/2015 0813   GLUCOSE 118* 09/15/2014 0948   BUN 65* 12/18/2015 0813   BUN 19* 09/15/2014 0948   CREATININE 5.90* 12/18/2015 0813   CREATININE 6.79* 09/15/2014 0948   CALCIUM 8.7* 12/18/2015 0813   CALCIUM 8.3* 09/15/2014 0948   GFRNONAA 7* 12/18/2015 0813   GFRNONAA 7* 09/15/2014 0948   GFRNONAA 6* 05/31/2014 0432   GFRAA 8*  12/18/2015 0813   GFRAA 8* 09/15/2014 0948   GFRAA 7* 05/31/2014 0432   Estimated Creatinine Clearance: 9.6 mL/min (by C-G formula based on Cr of 5.9).  COAG Lab Results  Component Value Date   INR 1.01 07/26/2015   INR 0.9 02/25/2012    Radiology Dg Chest 2 View  11/28/2015  CLINICAL DATA:  55 year old female status post left thoracentesis EXAM: CHEST  2 VIEW COMPARISON:  Pre thoracentesis chest x-ray 11/24/2006 FINDINGS: No evidence of left-sided pneumothorax. Significant interval decrease in the left layering pleural effusion. There is some residual atelectasis in the left base. Persistent moderate right pleural effusion with associated atelectasis. Cardiomegaly and pulmonary vascular congestion. Left subclavian approach central venous catheter  with the tip overlying the superior cavoatrial junction. Overlapping stents are present in the left subclavian and axillary veins. IMPRESSION: 1. No evidence of pneumothorax status post left-sided thoracentesis. 2. Significant interval decrease in the size of left pleural effusion. There is some persistent residual atelectasis of the left lung base. 3. Unchanged moderate layering right pleural effusion with associated right basilar atelectasis. 4. Cardiomegaly and pulmonary vascular congestion bordering on mild edema. Electronically Signed   By: Jacqulynn Cadet M.D.   On: 11/28/2015 10:25   Dg Chest 2 View  11/24/2015  CLINICAL DATA:  End-stage renal disease. Follow-up of pleural effusion. EXAM: CHEST  2 VIEW COMPARISON:  CT of 11/21/2015.  Plain film of 11/18/2015. FINDINGS: Extubation since the prior plain film. A left-sided subclavian line terminates at the cavoatrial junction. Left sided axillary stent. Midline trachea. Cardiomegaly accentuated by AP portable technique. left-sided pleural effusion is similar, small to moderate. Right-sided pleural effusion is slightly increased, moderate. No pneumothorax. Interstitial edema is moderate and increased.  Accentuated by AP portable technique. Bibasilar airspace disease persists. IMPRESSION: Slight increase in moderate congestive heart failure since 11/18/2015. Increased right and similar left -sided pleural effusions. Bibasilar airspace disease which could represent atelectasis or infection. Electronically Signed   By: Abigail Miyamoto M.D.   On: 11/24/2015 11:58   Ct Angio Chest Pe W/cm &/or Wo Cm  11/21/2015  CLINICAL DATA:  Blood in sputum. Concern for pulmonary embolism. Dialysis patient. COPD, asthma, and coronary disease. EXAM: CT ANGIOGRAPHY CHEST WITH CONTRAST TECHNIQUE: Multidetector CT imaging of the chest was performed using the standard protocol during bolus administration of intravenous contrast. Multiplanar CT image reconstructions and MIPs were obtained to evaluate the vascular anatomy. CONTRAST:  86mL OMNIPAQUE IOHEXOL 350 MG/ML SOLN COMPARISON:  Chest CT 10/10/2015 FINDINGS: Mediastinum/Nodes: No filling defects within the pulmonary to suggest acute pulmonary embolism. No acute findings aorta or great vessels. No pericardial effusion. Mild mediastinal lymphadenopathy. No axillary or supraclavicular adenopathy. Lungs/Pleura: Large bilateral layering pleural effusions. Passive atelectasis of the LEFT and RIGHT lower lobe. There is patchy airspace disease in the RIGHT upper lobe which reaches confluence at the apex with masslike consolidation measuring 3.5 by 2.6 cm (image 20, series 6). Some patchy airspace disease in the inferior lingula additionally. Upper abdomen: Limited view of the liver, kidneys, pancreas are unremarkable. Normal adrenal glands. Musculoskeletal: Diffuse sclerosis the bones.  Acute findings. Review of the MIP images confirms the above findings. IMPRESSION: 1. No acute pulmonary embolism. 2. Large bilateral pleural effusions and lower lobe atelectasis. 3. Bilateral patchy airspace disease suggesting MULTIFOCAL PNEUMONIA versus aspiration pneumonitis. 4. Consolidation in the RIGHT  upper low most suggestive of pulmonary infection. 5. Mild mediastinal adenopathy favored reactive. Electronically Signed   By: Suzy Bouchard M.D.   On: 11/21/2015 15:17   Dg Abd Acute W/chest  12/17/2015  CLINICAL DATA:  Nausea and abdominal pain.  Chronic renal failure. EXAM: DG ABDOMEN ACUTE W/ 1V CHEST COMPARISON:  Chest radiograph November 28, 2015; abdominal radiograph November 17, 2015 FINDINGS: PA chest: There are bilateral pleural effusions with bibasilar atelectasis. There is mild cardiomegaly with pulmonary venous hypertension. No frank edema. There is a stent in the subclavian and axillary regions on the left. Supine and upright abdomen: There is diffuse stool throughout colon. There is no bowel dilatation or air-fluid level suggesting obstruction. No free air. There is extensive vascular calcification. There are surgical clips in right upper quadrant region. IMPRESSION: Diffuse stool throughout colon. No bowel obstruction or free air. Evidence  of a degree of congestive heart failure. Electronically Signed   By: Lowella Grip III M.D.   On: 12/17/2015 10:27   US Thoracentesis Asp Pleural Space W/img Guide  11/28/2015  INDICATION: 55 year old female with end-stage renal disease on hemodialysis. She has bilateral moderate pleural effusions. After sonographic evaluation, the effusions are relatively symmetric in size although the right contains a few thin internal septations. Therefore, a left-sided thoracentesis will be performed. EXAM: ULTRASOUND GUIDED LEFT THORACENTESIS MEDICATIONS: None. COMPLICATIONS: None immediate. PROCEDURE: An ultrasound guided thoracentesis was thoroughly discussed with the patient and questions answered. The benefits, risks, alternatives and complications were also discussed. The patient understands and wishes to proceed with the procedure. Written consent was obtained. Ultrasound was performed to localize and mark an adequate pocket of fluid in the left chest. The area  was then prepped and draped in the normal sterile fashion. 1% Lidocaine was used for local anesthesia. Under ultrasound guidance a 6 Fr Safe-T-Centesis catheter was introduced. Thoracentesis was performed. The catheter was removed and a dressing applied. FINDINGS: A total of approximately 950 mL of dark amber colored fluid was removed. Samples were sent to the laboratory as requested by the clinical team. IMPRESSION: Successful ultrasound guided left thoracentesis yielding 950 mL of pleural fluid. Electronically Signed   By: Jacqulynn Cadet M.D.   On: 11/28/2015 10:13    Assessment/Plan 1.  Complication dialysis device with thrombosis AV access:  Patient's  dialysis access is thrombosed. The patient will undergo thrombectomy using interventional techniques. Potassium is too high at this time and she will therefore require a temporary catheter and subsequent dialysis and once her potassium is been normalized her thrombosed access can be addressed. 2.  End-stage renal disease requiring hemodialysis:  Patient will continue dialysis therapy without further interruption via a temporary catheter until her existing access can be treated.  If a successful thrombectomy is not achieved then a tunneled catheter will be placed. Dialysis has already been arranged since the patient missed their previous session 3.  Hypertension:  Patient will continue medical management; nephrology is following no changes in oral medications. 4. Diabetes mellitus:  Glucose will be monitored and oral medications been held this morning once the patient has undergone the patient's procedure po intake will be reinitiated and again Accu-Cheks will be used to assess the blood glucose level and treat as needed. The patient will be restarted on the patient's usual hypoglycemic regime 5.  Coronary artery disease:  EKG will be monitored. Nitrates will be used if needed. The patient's oral cardiac medications will be continued.     Darren Nodal,  Dolores Lory, MD  12/18/2015 3:30 PM

## 2015-12-18 NOTE — Progress Notes (Signed)
Subjective:   Potassium elevated at 6.1  Did not get dialysis yesterday. Clotted AVF.   Objective:  Vital signs in last 24 hours:  Temp:  [97.5 F (36.4 C)-98.4 F (36.9 C)] 97.9 F (36.6 C) (03/28 0429) Pulse Rate:  [58-62] 62 (03/28 0429) Resp:  [15-21] 20 (03/28 0429) BP: (132-190)/(61-126) 182/80 mmHg (03/28 0429) SpO2:  [94 %-100 %] 100 % (03/28 0429) Weight:  [66.7 kg (147 lb 0.8 oz)] 66.7 kg (147 lb 0.8 oz) (03/27 1547)  Weight change:  Filed Weights   12/17/15 0734 12/17/15 1547  Weight: 63.504 kg (140 lb) 66.7 kg (147 lb 0.8 oz)    Intake/Output:    Intake/Output Summary (Last 24 hours) at 12/18/15 1050 Last data filed at 12/18/15 0429  Gross per 24 hour  Intake    120 ml  Output    175 ml  Net    -55 ml     Physical Exam: General: No acute distress, laying in bed  HEENT Anicteric, adentulous  Neck supple  Pulm/lungs Left sided crackles  CV S1S2 no rubs  Abdomen:  Soft, Non tender, BS present  Extremities: No edema  Neurologic: Awake, alert, follows commands  Skin: No acute rashes  Access: LUE AVF - no thrill or bruit       Basic Metabolic Panel:   Recent Labs Lab 12/17/15 0942 12/17/15 1227 12/18/15 0813  NA 130*  --  131*  K 5.9*  --  6.1*  CL 94*  --  92*  CO2 26  --  27  GLUCOSE 95  --  67  BUN 60*  --  65*  CREATININE 5.09* 5.00* 5.90*  CALCIUM 8.6*  --  8.7*     CBC:  Recent Labs Lab 12/17/15 0942 12/17/15 1227 12/18/15 0813  WBC 6.9 7.1 5.7  NEUTROABS 3.8  --   --   HGB 9.5* 9.1* 10.7*  HCT 29.4* 28.7* 33.3*  MCV 85.3 86.0 87.4  PLT 343 318 321      Microbiology:  Recent Results (from the past 720 hour(s))  MRSA PCR Screening     Status: None   Collection Time: 12/17/15  6:59 PM  Result Value Ref Range Status   MRSA by PCR NEGATIVE NEGATIVE Final    Comment:        The GeneXpert MRSA Assay (FDA approved for NASAL specimens only), is one component of a comprehensive MRSA colonization surveillance  program. It is not intended to diagnose MRSA infection nor to guide or monitor treatment for MRSA infections.     Coagulation Studies: No results for input(s): LABPROT, INR in the last 72 hours.  Urinalysis: No results for input(s): COLORURINE, LABSPEC, PHURINE, GLUCOSEU, HGBUR, BILIRUBINUR, KETONESUR, PROTEINUR, UROBILINOGEN, NITRITE, LEUKOCYTESUR in the last 72 hours.  Invalid input(s): APPERANCEUR    Imaging: Dg Abd Acute W/chest  12/17/2015  CLINICAL DATA:  Nausea and abdominal pain.  Chronic renal failure. EXAM: DG ABDOMEN ACUTE W/ 1V CHEST COMPARISON:  Chest radiograph November 28, 2015; abdominal radiograph November 17, 2015 FINDINGS: PA chest: There are bilateral pleural effusions with bibasilar atelectasis. There is mild cardiomegaly with pulmonary venous hypertension. No frank edema. There is a stent in the subclavian and axillary regions on the left. Supine and upright abdomen: There is diffuse stool throughout colon. There is no bowel dilatation or air-fluid level suggesting obstruction. No free air. There is extensive vascular calcification. There are surgical clips in right upper quadrant region. IMPRESSION: Diffuse stool throughout colon. No bowel obstruction  or free air. Evidence of a degree of congestive heart failure. Electronically Signed   By: Lowella Grip III M.D.   On: 12/17/2015 10:27     Medications:     . acidophilus  1 capsule Oral Daily  . amiodarone  200 mg Oral Daily  . amLODipine  10 mg Oral Daily  . aspirin EC  81 mg Oral Daily  . atorvastatin  20 mg Oral QPM  . carvedilol  3.125 mg Oral BID WC  . cholecalciferol  400 Units Oral Daily  . citalopram  20 mg Oral Daily  . clopidogrel  75 mg Oral Daily  . guaiFENesin  600 mg Oral BID  . heparin  5,000 Units Subcutaneous 3 times per day  . insulin aspart  0-9 Units Subcutaneous TID WC  . lactulose  30 g Oral BID  . lipase/protease/amylase  12,000 Units Oral TID WC  . metoCLOPramide (REGLAN) injection   10 mg Intravenous 3 times per day  . multivitamin  1 tablet Oral QHS  . pantoprazole  40 mg Oral Daily  . polyethylene glycol  17 g Oral Daily  . predniSONE  10 mg Oral Q breakfast  . sevelamer carbonate  800 mg Oral TID WC  . sodium chloride flush  3 mL Intravenous Q12H  . vitamin B-12  1,000 mcg Oral Daily   sodium chloride, acetaminophen **OR** acetaminophen, albuterol, bisacodyl, glucose, hydrALAZINE, HYDROcodone-acetaminophen, HYDROmorphone (DILAUDID) injection, nitroGLYCERIN, ondansetron (ZOFRAN) IV, sodium chloride flush  Assessment/ Plan:  55 y.o. 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   [facility] MWF [place]  1. End Stage Renal Disease with hyperkalemia: MWF. Missed treatment yesterday due to complication of dialysis access.  - Plan for temp HD catheter to be placed by Vascular Surgery today. Then will get hemodialysis treatment today.  - Declot planned for Thursday.  - Kayexalate ordered  2. Anemia of chronic kidney disease: hemoglobin 10.7 -  holding off on epogen given right renal masses x 2. Discontinued micera as an outpatient.   3.   Secondary Hyperparathyroidism: PTH at goal 173 as outpatient. Phosphorus and calcium at goal as outpatient.  - Not currently on binders.   4. Hypertension: pain driven. Not well controlled.  - amlodipine, carvedilol,   5. Renal mass x 2.  Two small enhancing masses in upper pole of right kidney, suspicious for small renal cell carcinomas seen on abdominal CT with IV contrast on February 20 - holding epo.        LOS:  [name] 3/28/201710:50 AM

## 2015-12-18 NOTE — Progress Notes (Signed)
12/18/2015 2:43 PM  Specials sent for pt to get temp catheter placed.  Spoke to nurse in specials who indicated that they would consent pt down there as well as hang her normal saline.  Though there is an order to void on call to OR, this is an HD pt that cannot void on command.  I advised her of this as well.  They told me that this would all be taken care of.  Dola Argyle, RN

## 2015-12-18 NOTE — Progress Notes (Signed)
Hemodialysis completed. 

## 2015-12-18 NOTE — Progress Notes (Signed)
[place] [facility] [place] NAME: [name]    MR#:  [id number]  DATE OF BIRTH:  1960-10-16  SUBJECTIVE:   Patient here with nausea and vomiting typical gastroparesis flare  Her fistula is clotted going for temp cath today did not get HD yesterday due to this  REVIEW OF SYSTEMS:    Review of Systems  Constitutional: Negative for fever, chills and malaise/fatigue.  HENT: Negative for ear discharge, ear pain, hearing loss, nosebleeds and sore throat.   Eyes: Negative for blurred vision and pain.  Respiratory: Negative for cough, hemoptysis, shortness of breath and wheezing.   Cardiovascular: Negative for chest pain, palpitations and leg swelling.  Gastrointestinal: Positive for nausea, vomiting and abdominal pain. Negative for diarrhea and blood in stool.  Genitourinary: Negative for dysuria.  Musculoskeletal: Negative for back pain.  Neurological: Positive for weakness. Negative for dizziness, tremors, speech change, focal weakness, seizures and headaches.  Endo/Heme/Allergies: Does not bruise/bleed easily.  Psychiatric/Behavioral: Negative for depression, suicidal ideas and hallucinations.    Tolerating Diet no      DRUG ALLERGIES:   Allergies  Allergen Reactions  . Compazine [Prochlorperazine Edisylate] Anaphylaxis and Nausea And Vomiting  . Ace Inhibitors Swelling  . Ativan [Lorazepam] Other (See Comments)    Reaction:  Hallucinations and headaches  . Codeine Nausea And Vomiting  . Gabapentin Other (See Comments)    Reaction:  Unknown   . Losartan Other (See Comments)    Reaction:  Unknown   . Ondansetron Other (See Comments)    Reaction:  Unknown   . Prochlorperazine Other (See Comments)    Reaction:  Unknown   . Scopolamine Other (See Comments)    Reaction:  Unknown   . Zofran [Ondansetron Hcl] Other (See Comments)    Reaction:  hallucinations   . Oxycodone Anxiety  . Tape Rash    VITALS:  Blood pressure 182/80,  pulse 62, temperature 97.9 F (36.6 C), temperature source Oral, resp. rate 20, height 5\' 5"  (1.651 m), weight 66.7 kg (147 lb 0.8 oz), SpO2 100 %.  PHYSICAL EXAMINATION:   Physical Exam  Constitutional: She is oriented to person, place, and time and well-developed, well-nourished, and in no distress. No distress.  HENT:  Head: Normocephalic.  Eyes: No scleral icterus.  Neck: Normal range of motion. Neck supple. No JVD present. No tracheal deviation present.  Cardiovascular: Normal rate, regular rhythm and normal heart sounds.  Exam reveals no gallop and no friction rub.   No murmur heard. Pulmonary/Chest: Effort normal and breath sounds normal. No respiratory distress. She has no wheezes. She has no rales. She exhibits no tenderness.  Abdominal: Soft. Bowel sounds are normal. She exhibits no distension and no mass. There is tenderness. There is no rebound and no guarding.  Musculoskeletal: Normal range of motion. She exhibits no edema.  Neurological: She is alert and oriented to person, place, and time.  Skin: Skin is warm. No rash noted. No erythema.  Psychiatric: Affect and judgment normal.      LABORATORY PANEL:   CBC  Recent Labs Lab 12/18/15 0813  WBC 5.7  HGB 10.7*  HCT 33.3*  PLT 321   ------------------------------------------------------------------------------------------------------------------  Chemistries   Recent Labs Lab 12/17/15 0942  12/18/15 0813  NA 130*  --  131*  K 5.9*  --  6.1*  CL 94*  --  92*  CO2 26  --  27  GLUCOSE 95  --  67  BUN 60*  --  65*  CREATININE 5.09*  < > 5.90*  CALCIUM 8.6*  --  8.7*  AST 18  --   --   ALT 15  --   --   ALKPHOS 147*  --   --   BILITOT 1.0  --   --   < > = values in this interval not displayed. ------------------------------------------------------------------------------------------------------------------  Cardiac Enzymes No results for input(s): TROPONINI in the last 168  hours. ------------------------------------------------------------------------------------------------------------------  RADIOLOGY:  Dg Abd Acute W/chest  12/17/2015  CLINICAL DATA:  Nausea and abdominal pain.  Chronic renal failure. EXAM: DG ABDOMEN ACUTE W/ 1V CHEST COMPARISON:  Chest radiograph November 28, 2015; abdominal radiograph November 17, 2015 FINDINGS: PA chest: There are bilateral pleural effusions with bibasilar atelectasis. There is mild cardiomegaly with pulmonary venous hypertension. No frank edema. There is a stent in the subclavian and axillary regions on the left. Supine and upright abdomen: There is diffuse stool throughout colon. There is no bowel dilatation or air-fluid level suggesting obstruction. No free air. There is extensive vascular calcification. There are surgical clips in right upper quadrant region. IMPRESSION: Diffuse stool throughout colon. No bowel obstruction or free air. Evidence of a degree of congestive heart failure. Electronically Signed   By: Lowella Grip III M.D.   On: 12/17/2015 10:27     ASSESSMENT AND PLAN:    55 year old female with end-stage renal disease on hemodialysis and gastroparesis who presents with abdominal pain and nausea and vomiting similar to gastroparesis flare.   1. Generalized abdominal pain: this is due to a combination of gastroparesis flare and constipation. Start bowel regimen and continue REGLAN  2. ESRD : She has clotted fistula.  Plan for temporary cath today and on Thursday will have declot. Treat hyperkalemia with insulin/dextrose and kayexalate for now.   3. Accelerated hypertension: restart Coreg and Norvasc. Continue PRN  Hydralaizine and monitor.  4. Diabetes type 2 Continue SSI    5. Coronary artery disease continue ASA, Plavix and Cireg  6. Hyperlipidemia continue atorvastatin       Management plans discussed with the patient and she is in agreement.  CODE STATUS: FULL  TOTAL TIME TAKING CARE  OF THIS PATIENT: 30 minutes.     POSSIBLE D/C 3-4 days , DEPENDING ON CLINICAL CONDITION. Back to SNF   Emilya Justen M.D on 12/18/2015 at 10:44 AM  Between 7am to 6pm - Pager - [phone] After 6pm go to [email or web address] - password [email or web address] [facility]  Office  [phone]  CC: Primary care physician; Ellamae Sia, MD  Note: This dictation was prepared with Dragon dictation along with smaller phrase technology. Any transcriptional errors that result from this process are unintentional.

## 2015-12-19 ENCOUNTER — Inpatient Hospital Stay: Payer: Medicare Other

## 2015-12-19 LAB — BASIC METABOLIC PANEL
Anion gap: 7 (ref 5–15)
BUN: 23 mg/dL — ABNORMAL HIGH (ref 6–20)
CO2: 31 mmol/L (ref 22–32)
Calcium: 7.9 mg/dL — ABNORMAL LOW (ref 8.9–10.3)
Chloride: 95 mmol/L — ABNORMAL LOW (ref 101–111)
Creatinine, Ser: 3.32 mg/dL — ABNORMAL HIGH (ref 0.44–1.00)
GFR calc Af Amer: 17 mL/min — ABNORMAL LOW (ref >60)
GFR calc non Af Amer: 14 mL/min — ABNORMAL LOW (ref >60)
Glucose, Bld: 58 mg/dL — ABNORMAL LOW (ref 65–99)
Potassium: 3.8 mmol/L (ref 3.5–5.1)
Sodium: 133 mmol/L — ABNORMAL LOW (ref 135–145)

## 2015-12-19 LAB — GLUCOSE, CAPILLARY
Glucose-Capillary: 102 mg/dL — ABNORMAL HIGH (ref 65–99)
Glucose-Capillary: 125 mg/dL — ABNORMAL HIGH (ref 65–99)
Glucose-Capillary: 147 mg/dL — ABNORMAL HIGH (ref 65–99)
Glucose-Capillary: 51 mg/dL — ABNORMAL LOW (ref 65–99)
Glucose-Capillary: 77 mg/dL (ref 65–99)
Glucose-Capillary: 81 mg/dL (ref 65–99)
Glucose-Capillary: 97 mg/dL (ref 65–99)

## 2015-12-19 LAB — HEPATITIS B SURFACE ANTIBODY, QUANTITATIVE: Hepatitis B-Post: >1000 m[IU]/mL (ref >9.9)

## 2015-12-19 LAB — HEPATITIS B SURFACE ANTIGEN: Hepatitis B Surface Ag: NEGATIVE

## 2015-12-19 MED ORDER — HYDRALAZINE HCL 25 MG PO TABS
25.0000 mg | ORAL_TABLET | Freq: Three times a day (TID) | ORAL | Status: DC
  Administered 2015-12-19 – 2015-12-21 (×5): 25 mg via ORAL
  Filled 2015-12-19 (×5): qty 1

## 2015-12-19 MED ORDER — BOOST / RESOURCE BREEZE PO LIQD
1.0000 | Freq: Three times a day (TID) | ORAL | Status: DC
  Administered 2015-12-20: 1 via ORAL

## 2015-12-19 MED ORDER — CARVEDILOL 6.25 MG PO TABS
6.2500 mg | ORAL_TABLET | Freq: Two times a day (BID) | ORAL | Status: DC
  Administered 2015-12-19 – 2015-12-20 (×2): 6.25 mg via ORAL
  Filled 2015-12-19 (×2): qty 1

## 2015-12-19 NOTE — Consult Note (Signed)
GI Inpatient Consult Note  Reason for Consult:Abdominal pain   Attending Requesting Consult:Dr. Gouru  History of Present Illness: [name] is a 55 y.o. female on dialysis who has hx of gastroparesis who presented with right upper abd pain radiating into back and epigastric area.  Vomiting occurs now and then, belching seems to help the pain.  No hematemesis.  Spells come and go, helped by gagging.  Past Medical History:  Past Medical History  Diagnosis Date  . Asthma   . Collagen vascular disease ([place])   . Diabetes mellitus without complication ([place])   . Hypertension   . Coronary artery disease     a. cath 2013: stenting to RCA (report not available); b. cath 2014: LM nl, pLAD 40%, mLAD nl, ost LCx 40%, mid LCx nl, pRCA 30% @ site of prior stent, mRCA 50%  . Diabetic neuropathy ([place])   . ESRD (end stage renal disease) on dialysis ([place])     M-W-F  . GERD (gastroesophageal reflux disease)   . Hx of pancreatitis 2015  . Myocardial infarction ([place])   . Pneumonia   . Mitral regurgitation     a. echo 10/2013: EF 62%, noWMA, mildly dilated LA, mild to mod MR/TR, GR1DD  . COPD (chronic obstructive pulmonary disease) ([place])   . chronic diastolic CHF [id number]  . dialysis 2006  . Depression   . Anxiety   . Arthritis   . Headache   . Anemia   . Renal insufficiency     Problem List: Patient Active Problem List   Diagnosis Date Noted  . Malnutrition of moderate degree 12/01/2015  . Renal mass   . Dyspnea   . Acute renal failure ([place])   . Respiratory failure ([place])   . High temperature 11/14/2015  . Pulmonary edema   . Encounter for central line placement   . Encounter for orogastric (OG) tube placement   . Nausea 11/12/2015  . Hyperkalemia 10/03/2015  . C. difficile diarrhea 07/22/2015  . Pneumonia 05/21/2015  . Hypoglycemia 04/24/2015  . Unresponsiveness 04/24/2015  . Bradycardia 04/24/2015  . Hypothermia 04/24/2015  . Acute respiratory failure ([place]) 04/24/2015  . Acute  diastolic CHF (congestive heart failure) ([place]) 04/05/2015  . Diabetic gastroparesis ([place]) 04/05/2015  . Hypokalemia 04/05/2015  . Generalized weakness 04/05/2015  . Acute pulmonary edema ([place]) 04/03/2015  . Nausea and vomiting 04/03/2015  . Hypoglycemia associated with diabetes ([place]) 04/03/2015  . Anemia of chronic disease 04/03/2015  . Secondary hyperparathyroidism ([place]) 04/03/2015  . Pressure ulcer 04/02/2015  . Acute respiratory failure with hypoxia ([place]) 04/01/2015  . Adjustment disorder with anxiety 03/14/2015  . Somatic symptom disorder, mild 03/08/2015  . Coronary artery disease involving native coronary artery of native heart without angina pectoris   . Nausea & vomiting 03/06/2015  . Abdominal pain 03/06/2015  . DM (diabetes mellitus) ([place]) 03/06/2015  . HTN (hypertension) 03/06/2015  . Gastroparesis 02/24/2015  . Pleural effusion 02/19/2015  . HCAP (healthcare-associated pneumonia) 02/19/2015  . End-stage renal disease on hemodialysis ([place]) 02/19/2015    Past Surgical History: Past Surgical History  Procedure Laterality Date  . Cholecystectomy    . Appendectomy    . Abdominal hysterectomy    . Dialysis fistula creation Left     upper arm  . Esophagogastroduodenoscopy N/A 03/08/2015    Procedure: ESOPHAGOGASTRODUODENOSCOPY (EGD);  Surgeon: Manya Silvas, MD;  Location: [facility] ENDOSCOPY;  Service: Endoscopy;  Laterality: N/A;  . Cardiac catheterization Left 07/26/2015    Procedure: Left Heart Cath  and Coronary Angiography;  Surgeon: Dionisio David, MD;  Location: [facility] CV LAB;  Service: Cardiovascular;  Laterality: Left;  Marland Kitchen Eye surgery    . Fecal transplant N/A 08/23/2015    Procedure: FECAL TRANSPLANT;  Surgeon: Manya Silvas, MD;  Location: [facility] ENDOSCOPY;  Service: Endoscopy;  Laterality: N/A;    Allergies: Allergies  Allergen Reactions  . Compazine [Prochlorperazine Edisylate] Anaphylaxis and Nausea And Vomiting  . Ace Inhibitors Swelling  . Ativan  [Lorazepam] Other (See Comments)    Reaction:  Hallucinations and headaches  . Codeine Nausea And Vomiting  . Gabapentin Other (See Comments)    Reaction:  Unknown   . Losartan Other (See Comments)    Reaction:  Unknown   . Ondansetron Other (See Comments)    Reaction:  Unknown   . Prochlorperazine Other (See Comments)    Reaction:  Unknown   . Scopolamine Other (See Comments)    Reaction:  Unknown   . Zofran [Ondansetron Hcl] Other (See Comments)    Reaction:  hallucinations   . Oxycodone Anxiety  . Tape Rash    Home Medications: Prescriptions prior to admission  Medication Sig Dispense Refill Last Dose  . albuterol (PROVENTIL) (2.5 MG/3ML) 0.083% nebulizer solution Take 2.5 mg by nebulization every 4 (four) hours as needed for wheezing or shortness of breath.   prn at prn  . amLODipine (NORVASC) 10 MG tablet Take 10 mg by mouth daily.   unknown at unknown  . aspirin EC 81 MG tablet Take 81 mg by mouth daily.   unknown at unknown  . atorvastatin (LIPITOR) 20 MG tablet Take 20 mg by mouth every evening.   unknown at unknown  . B Complex-C-Biotin-E-FA (RENATABS) 1 MG TABS Take 1 mg by mouth daily.   unknown at unknown  . b complex-vitamin c-folic acid (NEPHRO-VITE) 0.8 MG TABS tablet Take 1 tablet by mouth daily.   unknown at unknown  . bisacodyl (DULCOLAX) 5 MG EC tablet Take 1 tablet (5 mg total) by mouth daily as needed for moderate constipation. 30 tablet 0 prn at prn  . carvedilol (COREG) 3.125 MG tablet Take 3.125 mg by mouth 2 (two) times daily with a meal.   unknown at unknown  . cetirizine (ZYRTEC) 10 MG tablet Take 10 mg by mouth daily.   unknown at unknown  . Cholecalciferol 1000 units TBDP Take 1,000 Units by mouth daily.   unknown at unknown  . citalopram (CELEXA) 20 MG tablet Take 20 mg by mouth daily.   unknown at unknown  . clopidogrel (PLAVIX) 75 MG tablet Take 75 mg by mouth daily. Reported on 12/17/2015   unknown at unknown  . glucose 4 GM chewable tablet Chew 3-4  tablets by mouth as needed for low blood sugar (if pts blood sugar is less than 60).   prn at prn  . HYDROcodone-acetaminophen (NORCO/VICODIN) 5-325 MG tablet Take 1 tablet by mouth every 6 (six) hours as needed for moderate pain. (Patient taking differently: Take 1 tablet by mouth 2 (two) times daily as needed for moderate pain. ) 30 tablet 0 prn at prn  . hydrocortisone (ANUSOL-HC) 25 MG suppository Place 25 mg rectally 2 (two) times daily.   unknown at unknown  . insulin aspart (NOVOLOG) 100 UNIT/ML FlexPen Inject into the skin. Take as directed   unknown at unknown  . insulin detemir (LEVEMIR) 100 UNIT/ML injection Inject 6 Units into the skin at bedtime.   unknown at unknown  . insulin regular (NOVOLIN  R,HUMULIN R) 100 units/mL injection Inject into the skin as needed for high blood sugar.   prn at prn  . lipase/protease/amylase (CREON) 12000 UNITS CPEP capsule Take 12,000 Units by mouth 3 (three) times daily with meals.    unknown at unknown  . meclizine (ANTIVERT) 25 MG tablet Take 25 mg by mouth 2 (two) times daily.   unknown at unknown  . metoCLOPramide (REGLAN) 5 MG tablet Take 5 mg by mouth 3 (three) times daily.   unknown at unknown  . metoprolol (LOPRESSOR) 50 MG tablet Take 50 mg by mouth 2 (two) times daily.   unknown at unknown  . nitroGLYCERIN (NITROSTAT) 0.4 MG SL tablet Place 0.4 mg under the tongue every 5 (five) minutes x 3 doses as needed for chest pain. *Max 3 doses per episode*   prn at prn  . omega-3 acid ethyl esters (LOVAZA) 1 g capsule Take 1 g by mouth daily.   unknown at unknown  . ondansetron (ZOFRAN) 4 MG tablet Take 4 mg by mouth every 8 (eight) hours as needed for nausea or vomiting.   prn at prn  . polyethylene glycol (MIRALAX / GLYCOLAX) packet Take 17 g by mouth daily as needed.   prn at prn  . promethazine (PHENERGAN) 12.5 MG tablet 1-2 tablets by mouth every 8 hours as needed for nausea and vomiting (Patient taking differently: 12.5 mg every 6 (six) hours as  needed for nausea or vomiting. 1-2 tablets by mouth every 8 hours as needed for nausea and vomiting) 20 tablet 0 prn at prn  . ranitidine (ZANTAC) 150 MG tablet Take 150 mg by mouth 2 (two) times daily as needed for heartburn.   prn at prn  . sevelamer carbonate (RENVELA) 800 MG tablet Take 1,600 mg by mouth 3 (three) times daily with meals.    unknown at unknown   Home medication reconciliation was completed with the patient.   Scheduled Inpatient Medications:   . amiodarone  200 mg Oral Daily  . amLODipine  10 mg Oral Daily  . aspirin EC  81 mg Oral Daily  . atorvastatin  20 mg Oral QPM  . carvedilol  6.25 mg Oral BID WC  . Chlorhexidine Gluconate Cloth  6 each Topical Once  . cholecalciferol  400 Units Oral Daily  . citalopram  20 mg Oral Daily  . clopidogrel  75 mg Oral Daily  . feeding supplement  1 Container Oral TID BM  . guaiFENesin  600 mg Oral BID  . heparin  5,000 Units Subcutaneous 3 times per day  . hydrALAZINE  25 mg Oral 3 times per day  . insulin aspart  0-9 Units Subcutaneous TID WC  . lactulose  30 g Oral BID  . lipase/protease/amylase  12,000 Units Oral TID WC  . metoCLOPramide (REGLAN) injection  10 mg Intravenous 3 times per day  . multivitamin  1 tablet Oral QHS  . pantoprazole  40 mg Oral Daily  . polyethylene glycol  17 g Oral Daily  . predniSONE  10 mg Oral Q breakfast  . sevelamer carbonate  800 mg Oral TID WC  . sodium chloride flush  3 mL Intravenous Q12H  . vitamin B-12  1,000 mcg Oral Daily    Continuous Inpatient Infusions:     PRN Inpatient Medications:  sodium chloride, acetaminophen **OR** acetaminophen, albuterol, bisacodyl, glucose, hydrALAZINE, HYDROcodone-acetaminophen, HYDROmorphone (DILAUDID) injection, nitroGLYCERIN, ondansetron (ZOFRAN) IV, promethazine, sodium chloride flush  Family History: family history includes Diabetes in her mother; Kidney disease  in her mother.  The patient's family history is negative for inflammatory bowel  disorders, GI malignancy, or solid organ transplantation.  Social History:   reports that she quit smoking about 10 months ago. Her smoking use included Cigarettes. She smoked 0.50 packs per day. She has never used smokeless tobacco. She reports that she does not drink alcohol or use illicit drugs. The patient denies ETOH, tobacco, or drug use.   Review of Systems: Constitutional: Weight is stable. No fever, swallowing ok,  Eyes: No changes in vision. ENT: No oral lesions, sore throat. Can't hear out of left ear. GI: see HPI.  Heme/Lymph: No easy bruising.  CV: No chest pain.  GU: No hematuria. Urinating OK Integumentary: No rashes.  Neuro: No headaches.  Psych: No depression/anxiety.  Endocrine: No heat/cold intolerance.  Allergic/Immunologic: No urticaria.  Resp: No cough, SOB.  Musculoskeletal: No joint swelling.    Physical Examination: BP 115/92 mmHg  Pulse 74  Temp(Src) 98.5 F (36.9 C) (Oral)  Resp 20  Ht 5\' 5"  (1.651 m)  Wt 62.6 kg (138 lb 0.1 oz)  BMI 22.97 kg/m2  SpO2 94% Gen: NAD, alert and oriented x 4 HEENT: , EOMI, n scleral icterus Neck: supple, no JVD or thyromegaly Chest: CTA bilaterally, no wheezes, crackles, or other adventitious sounds CV: RRR, no m/g/c/r maybe a 1/6 SEM.  Abd: soft, some tenderness in R epigastric area., ND, +BS in all four quadrants; no HSM, guarding, ridigity, or rebound tenderness Ext: no edema, well perfused with 2+ pulses, Skin: no rash or lesions noted Lymph: no LAD  Data: Lab Results  Component Value Date   WBC 4.9 12/18/2015   HGB 9.4* 12/18/2015   HCT 28.9* 12/18/2015   MCV 85.6 12/18/2015   PLT 291 12/18/2015    Recent Labs Lab 12/17/15 1227 12/18/15 0813 12/18/15 1635  HGB 9.1* 10.7* 9.4*   Lab Results  Component Value Date   NA 133* 12/19/2015   K 3.8 12/19/2015   CL 95* 12/19/2015   CO2 31 12/19/2015   BUN 23* 12/19/2015   CREATININE 3.32* 12/19/2015   Lab Results  Component Value Date   ALT 15  12/17/2015   AST 18 12/17/2015   ALKPHOS 147* 12/17/2015   BILITOT 1.0 12/17/2015   No results for input(s): APTT, INR, PTT in the last 168 hours. Assessment/Plan: Ms. Rile is a 55 y.o. female With end stage renal disease on dialysis with gastroparesis and episodic abd pain who had a stool transplant 08/2015 with no return of C. Diff diarrhea.  She was admitted due to abd pain which seems better now. Would switch to oral metoclopramide in a day or two and continue this as an out patient.  Some pain may be chronic pancreatitis as 50% of people with this condition have chronic narcotic dependent abd pain.  No plans for EGD at this time, consider [place] of abdomen.  Recommendations:  Thank you for the consult. Please call with questions or concerns.  Gaylyn Cheers, MD

## 2015-12-19 NOTE — Progress Notes (Addendum)
Initial Nutrition Assessment      INTERVENTION:  Monitor diet progression and intake.  Pt may benefit from liberalized diet of regular secondary to poor po intake once ready for diet to be progressed to solid foods Recommend boost breeze TID for added nutrition   NUTRITION DIAGNOSIS:   Inadequate oral intake related to altered GI function as evidenced by meal completion < 25%.    GOAL:   Patient will meet greater than or equal to 90% of their needs    MONITOR:   PO intake, Diet advancement  REASON FOR ASSESSMENT:   Malnutrition Screening Tool    ASSESSMENT:   62 y/p female admitted with abdominal pain, nausea, clotted AV site. Right groin temporary permcath placed on 3/28  Medications reviewed: lactulose, creon, reglan, mulitivitamin, protonix, renvela, vit B-12  Labs reviewed, Na 133, BUN 23, creatine 3.32, Calcium 7.9, glucose  58  Pt currently in dialysis and unable to meet with pt this am.  Noted did not eat any breakfast this am. Unable to determine intake prior to admission  Diet Order:  Diet clear liquid Room service appropriate?: Yes; Fluid consistency:: Thin  Skin:  Reviewed, no issues  Last BM:  3/29  Height:   Ht Readings from Last 1 Encounters:  12/17/15 5\' 5"  (1.651 m)    Weight: Noted wt loss of 17% in the last 3 months  Wt Readings from Last 1 Encounters:  12/19/15 138 lb 0.1 oz (62.6 kg)    Ideal Body Weight:  57 kg  BMI:  Body mass index is 22.97 kg/(m^2).  Estimated Nutritional Needs:   Kcal:  63.7 kg adj BW for kidney disease 2229 kcals/kg (using 35 kcals/kg)  Protein:  (1.2 g/kg) 72 g/d (Using SBW of 60kg)  Fluid:  1091ml + UOP  EDUCATION NEEDS:   No education needs identified at this time  Kito Cuffe B. Zenia Resides, [place], [place] (pager) Weekend/On-Call pager [phone])

## 2015-12-19 NOTE — Clinical Social Work Note (Signed)
Clinical Social Work Assessment  Patient Details  Name: [name] MRN: [id number] Date of Birth: Jun 01, 1961  Date of referral:  12/19/15               Reason for consult:  Facility Placement                Permission sought to share information with:  Facility Art therapist granted to share information::  Yes, Verbal Permission Granted  Name::        Agency::     Relationship::     Contact Information:     Housing/Transportation Living arrangements for the past 2 months:  [place] of Information:  Patient Patient Interpreter Needed:  None Criminal Activity/Legal Involvement Pertinent to Current Situation/Hospitalization:  No - Comment as needed Significant Relationships:    Lives with:  Facility Resident Do you feel safe going back to the place where you live?  Yes Need for family participation in patient care:  Yes (Comment)  Care giving concerns:  Patient is from [facility].   Social Worker assessment / plan:  Patient well known to CSW from previous admission in which patient discharged to [facility] for short term rehab. CSW met with patient briefly this afternoon but she was in significant discomfort and so CSW informed nurse and she was addressing this concern. Patient to return to [facility] at discharge.   Employment status:  Disabled (Comment on whether or not currently receiving Disability) Insurance information:  Medicare PT Recommendations:  Not assessed at this time Information / Referral to community resources:     Patient/Family's Response to care:  Patient expressed appreciation for CSW assisting with getting her assistance for her pain.  Patient/Family's Understanding of and Emotional Response to Diagnosis, Current Treatment, and Prognosis:  Patient currently in significant amount pain.  Emotional Assessment Appearance:  Appears stated age Attitude/Demeanor/Rapport:   (noticeably in pain and  discomfort) Affect (typically observed):  Anxious Orientation:  Oriented to Self, Oriented to Place, Oriented to  Time, Oriented to Situation Alcohol / Substance use:  Not Applicable Psych involvement (Current and /or in the community):  No (Comment)  Discharge Needs  Concerns to be addressed:  Care Coordination Readmission within the last 30 days:  Yes Current discharge risk:  None Barriers to Discharge:  No Barriers Identified   Shela Leff, LCSW 12/19/2015, 2:41 PM

## 2015-12-19 NOTE — Progress Notes (Signed)
[place] [facility] [place] NAME: [name]    MR#:  [id number]  DATE OF BIRTH:  26-Jan-1961  SUBJECTIVE:  CHIEF COMPLAINT:  The patient is nauseous, denies any vomiting. Reporting diarrhea, holding MiraLAX. Schedule for hemodialysis today. Reporting abdominal pain which is chronic from gastroparesis  REVIEW OF SYSTEMS:  CONSTITUTIONAL: No fever, fatigue or weakness.  EYES: No blurred or double vision.  EARS, NOSE, AND THROAT: No tinnitus or ear pain.  RESPIRATORY: No cough, shortness of breath, wheezing or hemoptysis.  CARDIOVASCULAR: No chest pain, orthopnea, edema.  GASTROINTESTINAL: Has nausea, denies vomiting, reporting diarrhea and chronic abdominal pain.  GENITOURINARY: No dysuria, hematuria.  ENDOCRINE: No polyuria, nocturia,  HEMATOLOGY: No anemia, easy bruising or bleeding SKIN: No rash or lesion. MUSCULOSKELETAL: No joint pain or arthritis.   NEUROLOGIC: No tingling, numbness, weakness.  PSYCHIATRY: No anxiety or depression.   DRUG ALLERGIES:   Allergies  Allergen Reactions  . Compazine [Prochlorperazine Edisylate] Anaphylaxis and Nausea And Vomiting  . Ace Inhibitors Swelling  . Ativan [Lorazepam] Other (See Comments)    Reaction:  Hallucinations and headaches  . Codeine Nausea And Vomiting  . Gabapentin Other (See Comments)    Reaction:  Unknown   . Losartan Other (See Comments)    Reaction:  Unknown   . Ondansetron Other (See Comments)    Reaction:  Unknown   . Prochlorperazine Other (See Comments)    Reaction:  Unknown   . Scopolamine Other (See Comments)    Reaction:  Unknown   . Zofran [Ondansetron Hcl] Other (See Comments)    Reaction:  hallucinations   . Oxycodone Anxiety  . Tape Rash    VITALS:  Blood pressure 188/69, pulse 77, temperature 98.5 F (36.9 C), temperature source Oral, resp. rate 20, height 5\' 5"  (1.651 m), weight 62.6 kg (138 lb 0.1 oz), SpO2 94 %.  PHYSICAL EXAMINATION:  GENERAL:  55  y.o.-year-old patient lying in the bed with no acute distress.  EYES: Pupils equal, round, reactive to light and accommodation. No scleral icterus. Extraocular muscles intact.  HEENT: Head atraumatic, normocephalic. Oropharynx and nasopharynx clear.  NECK:  Supple, no jugular venous distention. No thyroid enlargement, no tenderness.  LUNGS: Normal breath sounds bilaterally, no wheezing, rales,rhonchi or crepitation. No use of accessory muscles of respiration.  CARDIOVASCULAR: S1, S2 normal. No murmurs, rubs, or gallops.  ABDOMEN: Soft, generalized tenderness, no rebound tenderness, nondistended. Bowel sounds present. No organomegaly or mass.  EXTREMITIES: No pedal edema, cyanosis, or clubbing.  NEUROLOGIC: Cranial nerves II through XII are intact. Muscle strength 5/5 in all extremities. Sensation intact. Gait not checked.  PSYCHIATRIC: The patient is alert and oriented x 3.  SKIN: No obvious rash, lesion, or ulcer.    LABORATORY PANEL:   CBC  Recent Labs Lab 12/18/15 1635  WBC 4.9  HGB 9.4*  HCT 28.9*  PLT 291   ------------------------------------------------------------------------------------------------------------------  Chemistries   Recent Labs Lab 12/17/15 0942  12/19/15 0724  NA 130*  < > 133*  K 5.9*  < > 3.8  CL 94*  < > 95*  CO2 26  < > 31  GLUCOSE 95  < > 58*  BUN 60*  < > 23*  CREATININE 5.09*  < > 3.32*  CALCIUM 8.6*  < > 7.9*  AST 18  --   --   ALT 15  --   --   ALKPHOS 147*  --   --   BILITOT 1.0  --   --   < > =  values in this interval not displayed. ------------------------------------------------------------------------------------------------------------------  Cardiac Enzymes No results for input(s): TROPONINI in the last 168 hours. ------------------------------------------------------------------------------------------------------------------  RADIOLOGY:  Dg Abd 1 View  12/19/2015  CLINICAL DATA:  Status post catheter placement EXAM:  ABDOMEN - 1 VIEW COMPARISON:  12/17/2015 FINDINGS: Right groin hemodialysis catheter is identified with tip in the expected location of the IVC. There are cholecystectomy clips noted in the right upper quadrant of the abdomen. Normal bowel gas pattern. IMPRESSION: 1. Tip of the hemodialysis catheter is in the expected location of the IVC. Electronically Signed   By: Kerby Moors M.D.   On: 12/19/2015 08:37    EKG:   Orders placed or performed during the hospital encounter of 11/12/15  . ED EKG  . ED EKG  . EKG 12-Lead  . EKG 12-Lead  . EKG 12-Lead  . EKG 12-Lead  . EKG 12-Lead  . EKG 12-Lead    ASSESSMENT AND PLAN:   55 year old female with end-stage renal disease on hemodialysis and gastroparesis who presents with abdominal pain and nausea and vomiting similar to gastroparesis flare.   1. Generalized abdominal pain: this is due to a combination of gastroparesis flare and constipation. continue REGLAN Had several bowel movements and reporting diarrhea from laxatives, will hold off on the laxatives GI consult as patient is not clinically improving  2. ESRD : She has clotted fistula.  Hent temporary dialysis catheter yesterday,Thursday will have declot. For hemodialysis today. Hyperkalemia resolved presently 3.8   3. Accelerated hypertension:  Increase the dose of Coreg to 6.25 by mouth twice a day Hydralazine 25 mg by mouth every 8 hours is added to the regimen Continue Norvasc and titrate as needed Continue PRN Hydralaizine and monitor.  4. Diabetes type 2 Continue SSI    5. Coronary artery disease continue ASA, Plavix and Cireg  6. Hyperlipidemia continue atorvastatin        All the records are reviewed and case discussed with Care Management/Social Workerr. Management plans discussed with the patient, family and they are in agreement.  CODE STATUS: fc  TOTAL TIME TAKING CARE OF THIS PATIENT: 35 minutes.   POSSIBLE D/C IN 2  DAYS, DEPENDING ON CLINICAL  CONDITION.   Nicholes Mango M.D on 12/19/2015 at 3:55 PM  Between 7am to 6pm - Pager - [phone] After 6pm go to [email or web address] - password [email or web address] [place]  Office  [phone]  CC: Primary care physician; Ellamae Sia, MD

## 2015-12-19 NOTE — Care Management Important Message (Signed)
Important Message  Patient Details  Name: [name] MRN: [id number] Date of Birth: [birth year]   Medicare Important Message Given:  Yes    Beverly Sessions, RN 12/19/2015, 10:51 AM

## 2015-12-19 NOTE — NC FL2 (Signed)
[place] LEVEL OF CARE SCREENING TOOL     IDENTIFICATION  Patient Name: [name] Birthdate: 12/22/1960 Sex: female Admission Date (Current Location): 12/17/2015  [facility] and [place] Number:  Engineering geologist and Address:  [facility], [address] [place], [place] [address]      Provider Number: [id number]  Attending Physician Name and Address:  Nicholes Mango, MD  Relative Name and Phone Number:       Current Level of Care: Hospital Recommended Level of Care: [place] Prior Approval Number:    Date Approved/Denied:   PASRR Number:    Discharge Plan: SNF    Current Diagnoses: Patient Active Problem List   Diagnosis Date Noted  . Malnutrition of moderate degree 12/01/2015  . Renal mass   . Dyspnea   . Acute renal failure ([place])   . Respiratory failure ([place])   . High temperature 11/14/2015  . Pulmonary edema   . Encounter for central line placement   . Encounter for orogastric (OG) tube placement   . Nausea 11/12/2015  . Hyperkalemia 10/03/2015  . C. difficile diarrhea 07/22/2015  . Pneumonia 05/21/2015  . Hypoglycemia 04/24/2015  . Unresponsiveness 04/24/2015  . Bradycardia 04/24/2015  . Hypothermia 04/24/2015  . Acute respiratory failure ([place]) 04/24/2015  . Acute diastolic CHF (congestive heart failure) ([place]) 04/05/2015  . Diabetic gastroparesis ([place]) 04/05/2015  . Hypokalemia 04/05/2015  . Generalized weakness 04/05/2015  . Acute pulmonary edema ([place]) 04/03/2015  . Nausea and vomiting 04/03/2015  . Hypoglycemia associated with diabetes ([place]) 04/03/2015  . Anemia of chronic disease 04/03/2015  . Secondary hyperparathyroidism ([place]) 04/03/2015  . Pressure ulcer 04/02/2015  . Acute respiratory failure with hypoxia ([place]) 04/01/2015  . Adjustment disorder with anxiety 03/14/2015  . Somatic symptom disorder, mild 03/08/2015  . Coronary artery disease involving native coronary artery of native heart  without angina pectoris   . Nausea & vomiting 03/06/2015  . Abdominal pain 03/06/2015  . DM (diabetes mellitus) ([place]) 03/06/2015  . HTN (hypertension) 03/06/2015  . Gastroparesis 02/24/2015  . Pleural effusion 02/19/2015  . HCAP (healthcare-associated pneumonia) 02/19/2015  . End-stage renal disease on hemodialysis ([place]) 02/19/2015    Orientation RESPIRATION BLADDER Height & Weight     Self, Time, Situation, Place  Normal Continent Weight: 138 lb 0.1 oz (62.6 kg) Height:  5\' 5"  (165.1 cm)  BEHAVIORAL SYMPTOMS/MOOD NEUROLOGICAL BOWEL NUTRITION STATUS   (none)  (none) Continent Diet (clear liquid)  AMBULATORY STATUS COMMUNICATION OF NEEDS Skin   Extensive Assist Verbally Normal                       Personal Care Assistance Level of Assistance  Bathing, Dressing Bathing Assistance: Limited assistance   Dressing Assistance: Limited assistance     Functional Limitations Info             SPECIAL CARE FACTORS FREQUENCY   (dialysis)                    Contractures Contractures Info: Not present    Additional Factors Info                  Current Medications (12/19/2015):  This is the current hospital active medication list Current Facility-Administered Medications  Medication Dose Route Frequency Provider Last Rate Last Dose  . 0.9 %  sodium chloride infusion  250 mL Intravenous PRN Dustin Flock, MD      . acetaminophen (TYLENOL)  tablet 650 mg  650 mg Oral Q6H PRN Dustin Flock, MD       Or  . acetaminophen (TYLENOL) suppository 650 mg  650 mg Rectal Q6H PRN Dustin Flock, MD      . albuterol (PROVENTIL) (2.5 MG/3ML) 0.083% nebulizer solution 2.5 mg  2.5 mg Nebulization Q4H PRN Dustin Flock, MD      . amiodarone (PACERONE) tablet 200 mg  200 mg Oral Daily Dustin Flock, MD   200 mg at 12/19/15 1444  . amLODipine (NORVASC) tablet 10 mg  10 mg Oral Daily Bettey Costa, MD   10 mg at 12/19/15 1444  . aspirin EC tablet 81 mg  81 mg Oral Daily Dustin Flock, MD   81 mg at 12/19/15 1443  . atorvastatin (LIPITOR) tablet 20 mg  20 mg Oral QPM Dustin Flock, MD   Stopped at 12/18/15 1800  . bisacodyl (DULCOLAX) EC tablet 5 mg  5 mg Oral Daily PRN Dustin Flock, MD      . carvedilol (COREG) tablet 3.125 mg  3.125 mg Oral BID WC Bettey Costa, MD   Stopped at 12/18/15 1700  . Chlorhexidine Gluconate Cloth 2 % PADS 6 each  6 each Topical Once Algernon Huxley, MD   6 each at 12/18/15 1445  . cholecalciferol (VITAMIN D) tablet 400 Units  400 Units Oral Daily Dustin Flock, MD   400 Units at 12/19/15 1026  . citalopram (CELEXA) tablet 20 mg  20 mg Oral Daily Dustin Flock, MD   20 mg at 12/19/15 1027  . clopidogrel (PLAVIX) tablet 75 mg  75 mg Oral Daily Dustin Flock, MD   75 mg at 12/19/15 1444  . feeding supplement (BOOST / RESOURCE BREEZE) liquid 1 Container  1 Container Oral TID BM Naviah Belfield, MD      . glucose chewable tablet 12-16 g  3-4 tablet Oral PRN Dustin Flock, MD   16 g at 12/17/15 2250  . guaiFENesin (MUCINEX) 12 hr tablet 600 mg  600 mg Oral BID Dustin Flock, MD   600 mg at 12/19/15 1027  . heparin injection 5,000 Units  5,000 Units Subcutaneous 3 times per day Dustin Flock, MD   5,000 Units at 12/19/15 0501  . hydrALAZINE (APRESOLINE) injection 10 mg  10 mg Intravenous Q6H PRN Dustin Flock, MD      . HYDROcodone-acetaminophen (NORCO/VICODIN) 5-325 MG per tablet 1-2 tablet  1-2 tablet Oral Q4H PRN Dustin Flock, MD   2 tablet at 12/19/15 0811  . HYDROmorphone (DILAUDID) injection 0.5 mg  0.5 mg Intravenous Q4H PRN Dustin Flock, MD   0.5 mg at 12/19/15 1440  . insulin aspart (novoLOG) injection 0-9 Units  0-9 Units Subcutaneous TID WC Dustin Flock, MD   Stopped at 12/18/15 1700  . lactulose (CHRONULAC) 10 GM/15ML solution 30 g  30 g Oral BID Bettey Costa, MD   30 g at 12/18/15 2155  . lipase/protease/amylase (CREON) capsule 12,000 Units  12,000 Units Oral TID WC Dustin Flock, MD   12,000 Units at 12/19/15 1443  . metoCLOPramide  (REGLAN) injection 10 mg  10 mg Intravenous 3 times per day Dustin Flock, MD   10 mg at 12/19/15 1026  . multivitamin (RENA-VIT) tablet 1 tablet  1 tablet Oral QHS Dustin Flock, MD   1 tablet at 12/18/15 2155  . nitroGLYCERIN (NITROSTAT) SL tablet 0.4 mg  0.4 mg Sublingual Q5 Min x 3 PRN Dustin Flock, MD      . ondansetron (ZOFRAN) injection 4 mg  4 mg Intravenous Q6H PRN Harrie Foreman, MD   4 mg at 12/18/15 1235  . pantoprazole (PROTONIX) EC tablet 40 mg  40 mg Oral Daily Dustin Flock, MD   40 mg at 12/19/15 1027  . polyethylene glycol (MIRALAX / GLYCOLAX) packet 17 g  17 g Oral Daily Bettey Costa, MD   17 g at 12/18/15 1238  . predniSONE (DELTASONE) tablet 10 mg  10 mg Oral Q breakfast Dustin Flock, MD   10 mg at 12/19/15 0901  . promethazine (PHENERGAN) tablet 12.5 mg  12.5 mg Oral Q6H PRN Lance Coon, MD   12.5 mg at 12/19/15 0811  . sevelamer carbonate (RENVELA) tablet 800 mg  800 mg Oral TID WC Dustin Flock, MD   800 mg at 12/19/15 1443  . sodium chloride flush (NS) 0.9 % injection 3 mL  3 mL Intravenous Q12H Dustin Flock, MD   3 mL at 12/19/15 1000  . sodium chloride flush (NS) 0.9 % injection 3 mL  3 mL Intravenous PRN Dustin Flock, MD      . vitamin B-12 (CYANOCOBALAMIN) tablet 1,000 mcg  1,000 mcg Oral Daily Dustin Flock, MD   1,000 mcg at 12/19/15 1443     Discharge Medications: Please see discharge summary for a list of discharge medications.  Relevant Imaging Results:  Relevant Lab Results:   Additional Information    Shela Leff, LCSW

## 2015-12-19 NOTE — Progress Notes (Signed)
Subjective:   Seen and examined on dialysis today. Tolerating treatment. Right femoral temp HD catheter placed yesterday. Hemodialysis yesterday due to missed treatment Monday and hyperkalemia.   Objective:  Vital signs in last 24 hours:  Temp:  [97.8 F (36.6 C)-98.2 F (36.8 C)] 98.2 F (36.8 C) (03/29 1031) Pulse Rate:  [50-72] 65 (03/29 1047) Resp:  [10-20] 18 (03/29 1047) BP: (107-173)/(34-87) 168/66 mmHg (03/29 1047) SpO2:  [95 %-100 %] 96 % (03/29 1047) Weight:  [66 kg (145 lb 8.1 oz)-66.8 kg (147 lb 4.3 oz)] 66 kg (145 lb 8.1 oz) (03/28 1930)  Weight change: 3.297 kg (7 lb 4.3 oz) Filed Weights   12/17/15 1547 12/18/15 1610 12/18/15 1930  Weight: 66.7 kg (147 lb 0.8 oz) 66.8 kg (147 lb 4.3 oz) 66 kg (145 lb 8.1 oz)    Intake/Output:    Intake/Output Summary (Last 24 hours) at 12/19/15 1107 Last data filed at 12/19/15 0900  Gross per 24 hour  Intake    240 ml  Output    550 ml  Net   -310 ml     Physical Exam: General: No acute distress, laying in bed  HEENT Anicteric, adentulous  Neck supple  Pulm/lungs Left sided crackles  CV S1S2 no rubs  Abdomen:  Soft, Non tender, BS present  Extremities: No edema  Neurologic: Awake, alert, follows commands  Skin: No acute rashes  Access: LUE AVF - no thrill or bruit       Basic Metabolic Panel:   Recent Labs Lab 12/17/15 0942 12/17/15 1227 12/18/15 0813 12/19/15 0724  NA 130*  --  131* 133*  K 5.9*  --  6.1* 3.8  CL 94*  --  92* 95*  CO2 26  --  27 31  GLUCOSE 95  --  67 58*  BUN 60*  --  65* 23*  CREATININE 5.09* 5.00* 5.90* 3.32*  CALCIUM 8.6*  --  8.7* 7.9*     CBC:  Recent Labs Lab 12/17/15 0942 12/17/15 1227 12/18/15 0813 12/18/15 1635  WBC 6.9 7.1 5.7 4.9  NEUTROABS 3.8  --   --  3.0  HGB 9.5* 9.1* 10.7* 9.4*  HCT 29.4* 28.7* 33.3* 28.9*  MCV 85.3 86.0 87.4 85.6  PLT 343 318 321 291      Microbiology:  Recent Results (from the past 720 hour(s))  MRSA PCR Screening      Status: None   Collection Time: 12/17/15  6:59 PM  Result Value Ref Range Status   MRSA by PCR NEGATIVE NEGATIVE Final    Comment:        The GeneXpert MRSA Assay (FDA approved for NASAL specimens only), is one component of a comprehensive MRSA colonization surveillance program. It is not intended to diagnose MRSA infection nor to guide or monitor treatment for MRSA infections.     Coagulation Studies: No results for input(s): LABPROT, INR in the last 72 hours.  Urinalysis: No results for input(s): COLORURINE, LABSPEC, PHURINE, GLUCOSEU, HGBUR, BILIRUBINUR, KETONESUR, PROTEINUR, UROBILINOGEN, NITRITE, LEUKOCYTESUR in the last 72 hours.  Invalid input(s): APPERANCEUR    Imaging: Dg Abd 1 View  12/19/2015  CLINICAL DATA:  Status post catheter placement EXAM: ABDOMEN - 1 VIEW COMPARISON:  12/17/2015 FINDINGS: Right groin hemodialysis catheter is identified with tip in the expected location of the IVC. There are cholecystectomy clips noted in the right upper quadrant of the abdomen. Normal bowel gas pattern. IMPRESSION: 1. Tip of the hemodialysis catheter is in the expected location of  the IVC. Electronically Signed   By: Kerby Moors M.D.   On: 12/19/2015 08:37     Medications:     . acidophilus  1 capsule Oral Daily  . amiodarone  200 mg Oral Daily  . amLODipine  10 mg Oral Daily  . aspirin EC  81 mg Oral Daily  . atorvastatin  20 mg Oral QPM  . carvedilol  3.125 mg Oral BID WC  . Chlorhexidine Gluconate Cloth  6 each Topical Once  . cholecalciferol  400 Units Oral Daily  . citalopram  20 mg Oral Daily  . clopidogrel  75 mg Oral Daily  . guaiFENesin  600 mg Oral BID  . heparin  5,000 Units Subcutaneous 3 times per day  . insulin aspart  0-9 Units Subcutaneous TID WC  . lactulose  30 g Oral BID  . lipase/protease/amylase  12,000 Units Oral TID WC  . metoCLOPramide (REGLAN) injection  10 mg Intravenous 3 times per day  . multivitamin  1 tablet Oral QHS  .  pantoprazole  40 mg Oral Daily  . polyethylene glycol  17 g Oral Daily  . predniSONE  10 mg Oral Q breakfast  . sevelamer carbonate  800 mg Oral TID WC  . sodium chloride flush  3 mL Intravenous Q12H  . vitamin B-12  1,000 mcg Oral Daily   sodium chloride, acetaminophen **OR** acetaminophen, albuterol, bisacodyl, glucose, hydrALAZINE, HYDROcodone-acetaminophen, HYDROmorphone (DILAUDID) injection, nitroGLYCERIN, ondansetron (ZOFRAN) IV, promethazine, sodium chloride flush  Assessment/ Plan:  55 y.o. 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   [facility] MWF [place]  1. End Stage Renal Disease with hyperkalemia: MWF. Missed treatment Monday due to complication of dialysis access.  - Plan for temp HD catheter placed yesterday. - Continue MWF schedule. Seen and examined on dialysis today.  - Declot planned for Thursday.   2. Anemia of chronic kidney disease:  -  holding off on epogen given right renal masses x 2. Discontinued micera as an outpatient.   3.   Secondary Hyperparathyroidism: PTH at goal 173 as outpatient. Phosphorus and calcium at goal as outpatient.  - Not currently on binders.   4. Hypertension: pain driven. Not well controlled.  - amlodipine, carvedilol  5. Renal mass x 2.  Two small enhancing masses in upper pole of right kidney, suspicious for small renal cell carcinomas seen on abdominal CT with IV contrast on February 20 - holding epo.        LOSJuleen China, Lurena Nida 3/29/201711:07 AM

## 2015-12-20 ENCOUNTER — Encounter: Payer: Self-pay | Admitting: *Deleted

## 2015-12-20 ENCOUNTER — Encounter
Admission: EM | Disposition: A | Discharge status: Skilled Nursing Facility | Payer: Self-pay | Source: Home / Self Care | Attending: Internal Medicine

## 2015-12-20 ENCOUNTER — Inpatient Hospital Stay: Payer: Medicare Other

## 2015-12-20 HISTORY — PX: PERIPHERAL VASCULAR CATHETERIZATION: SHX172C

## 2015-12-20 LAB — BASIC METABOLIC PANEL
Anion gap: 9 (ref 5–15)
BUN: 15 mg/dL (ref 6–20)
CO2: 29 mmol/L (ref 22–32)
Calcium: 8.6 mg/dL — ABNORMAL LOW (ref 8.9–10.3)
Chloride: 98 mmol/L — ABNORMAL LOW (ref 101–111)
Creatinine, Ser: 2.96 mg/dL — ABNORMAL HIGH (ref 0.44–1.00)
GFR calc Af Amer: 19 mL/min — ABNORMAL LOW (ref >60)
GFR calc non Af Amer: 17 mL/min — ABNORMAL LOW (ref >60)
Glucose, Bld: 80 mg/dL (ref 65–99)
Potassium: 3.9 mmol/L (ref 3.5–5.1)
Sodium: 136 mmol/L (ref 135–145)

## 2015-12-20 LAB — CBC
HCT: 30.5 % — ABNORMAL LOW (ref 35.0–47.0)
Hemoglobin: 9.5 g/dL — ABNORMAL LOW (ref 12.0–16.0)
MCH: 27.2 pg (ref 26.0–34.0)
MCHC: 31 g/dL — ABNORMAL LOW (ref 32.0–36.0)
MCV: 87.8 fL (ref 80.0–100.0)
Platelets: 209 10*3/uL (ref 150–440)
RBC: 3.48 MIL/uL — ABNORMAL LOW (ref 3.80–5.20)
RDW: 25.5 % — ABNORMAL HIGH (ref 11.5–14.5)
WBC: 6 10*3/uL (ref 3.6–11.0)

## 2015-12-20 LAB — GLUCOSE, CAPILLARY
Glucose-Capillary: 71 mg/dL (ref 65–99)
Glucose-Capillary: 136 mg/dL — ABNORMAL HIGH (ref 65–99)
Glucose-Capillary: 199 mg/dL — ABNORMAL HIGH (ref 65–99)

## 2015-12-20 SURGERY — THROMBECTOMY
Anesthesia: Moderate Sedation

## 2015-12-20 MED ORDER — ALTEPLASE 2 MG IJ SOLR
INTRAMUSCULAR | Status: AC
  Filled 2015-12-20: qty 4

## 2015-12-20 MED ORDER — HEPARIN SODIUM (PORCINE) 1000 UNIT/ML IJ SOLN
INTRAMUSCULAR | Status: AC
  Administered 2015-12-20: 18:00:00
  Filled 2015-12-20: qty 1

## 2015-12-20 MED ORDER — HEPARIN SODIUM (PORCINE) 1000 UNIT/ML IJ SOLN
INTRAMUSCULAR | Status: DC | PRN
  Administered 2015-12-20: 4000 [IU] via INTRAVENOUS

## 2015-12-20 MED ORDER — IOPAMIDOL (ISOVUE-300) INJECTION 61%
INTRAVENOUS | Status: DC | PRN
  Administered 2015-12-20: 20 mL via INTRAVENOUS

## 2015-12-20 MED ORDER — FENTANYL CITRATE (PF) 100 MCG/2ML IJ SOLN
INTRAMUSCULAR | Status: DC | PRN
  Administered 2015-12-20 (×2): 50 ug via INTRAVENOUS

## 2015-12-20 MED ORDER — LIDOCAINE-EPINEPHRINE (PF) 1 %-1:200000 IJ SOLN
INTRAMUSCULAR | Status: DC | PRN
  Administered 2015-12-20: 10 mL via INTRADERMAL

## 2015-12-20 MED ORDER — FENTANYL CITRATE (PF) 100 MCG/2ML IJ SOLN
INTRAMUSCULAR | Status: AC
  Filled 2015-12-20: qty 2

## 2015-12-20 MED ORDER — SODIUM CHLORIDE 0.9 % IV SOLN
INTRAVENOUS | Status: DC
Start: 2015-12-20 — End: 2015-12-20
  Administered 2015-12-20: 14:00:00 via INTRAVENOUS

## 2015-12-20 MED ORDER — HEPARIN (PORCINE) IN NACL 2-0.9 UNIT/ML-% IJ SOLN
INTRAMUSCULAR | Status: AC
  Filled 2015-12-20: qty 1000

## 2015-12-20 MED ORDER — ALTEPLASE 2 MG IJ SOLR
INTRAMUSCULAR | Status: DC | PRN
  Administered 2015-12-20: 4 mg

## 2015-12-20 MED ORDER — LIDOCAINE-EPINEPHRINE (PF) 1 %-1:200000 IJ SOLN
INTRAMUSCULAR | Status: AC
  Filled 2015-12-20: qty 30

## 2015-12-20 MED ORDER — CHLORHEXIDINE GLUCONATE CLOTH 2 % EX PADS
6.0000 | MEDICATED_PAD | Freq: Once | CUTANEOUS | Status: AC
Start: 2015-12-20 — End: 2015-12-20
  Administered 2015-12-20: 6 via TOPICAL

## 2015-12-20 MED ORDER — MIDAZOLAM HCL 2 MG/2ML IJ SOLN
INTRAMUSCULAR | Status: DC | PRN
  Administered 2015-12-20 (×2): 1 mg via INTRAVENOUS

## 2015-12-20 MED ORDER — MIDAZOLAM HCL 5 MG/5ML IJ SOLN
INTRAMUSCULAR | Status: AC
  Filled 2015-12-20: qty 5

## 2015-12-20 MED ORDER — DEXTROSE 5 % IV SOLN
1.5000 g | Freq: Once | INTRAVENOUS | Status: AC
  Administered 2015-12-20: 1.5 g via INTRAVENOUS

## 2015-12-20 SURGICAL SUPPLY — 13 items
BALLN DORADO 7X200X80 (BALLOONS) ×2
BALLOON DORADO 7X200X80 (BALLOONS) ×1 IMPLANT
CANNULA 5F STIFF (CANNULA) ×4 IMPLANT
CATH EMBOLECTOMY 5FR (BALLOONS) ×2 IMPLANT
DEVICE PRESTO INFLATION (MISCELLANEOUS) ×2 IMPLANT
DEVICE SOLENT PROXI 90CM (CATHETERS) ×2 IMPLANT
DRAPE BRACHIAL (DRAPES) ×2 IMPLANT
PACK ANGIOGRAPHY (CUSTOM PROCEDURE TRAY) ×2 IMPLANT
SET AVX THROMB ULT (MISCELLANEOUS) IMPLANT
SHEATH BRITE TIP 6FRX5.5 (SHEATH) ×4 IMPLANT
SUT MNCRL AB 4-0 PS2 18 (SUTURE) ×2 IMPLANT
TOWEL OR 17X26 4PK STRL BLUE (TOWEL DISPOSABLE) ×4 IMPLANT
WIRE MAGIC TOR.035 180C (WIRE) ×4 IMPLANT

## 2015-12-20 NOTE — Progress Notes (Signed)
[place] [facility] [place] NAME: [name]    MR#:  [id number]  DATE OF BIRTH:  June 01, 1961  SUBJECTIVE:  CHIEF COMPLAINT:  Patient reports that her nausea is resolved she wants to eat. She is awaiting procedure for the fistula.  REVIEW OF SYSTEMS:  CONSTITUTIONAL: No fever, fatigue or weakness.  EYES: No blurred or double vision.  EARS, NOSE, AND THROAT: No tinnitus or ear pain.  RESPIRATORY: No cough, shortness of breath, wheezing or hemoptysis.  CARDIOVASCULAR: No chest pain, orthopnea, edema.  GASTROINTESTINAL: Resolved nausea denies vomiting, improved diarrhea and chronic abdominal pain.  GENITOURINARY: No dysuria, hematuria.  ENDOCRINE: No polyuria, nocturia,  HEMATOLOGY: No anemia, easy bruising or bleeding SKIN: No rash or lesion. MUSCULOSKELETAL: No joint pain or arthritis.   NEUROLOGIC: No tingling, numbness, weakness.  PSYCHIATRY: No anxiety or depression.   DRUG ALLERGIES:   Allergies  Allergen Reactions  . Compazine [Prochlorperazine Edisylate] Anaphylaxis and Nausea And Vomiting  . Ace Inhibitors Swelling  . Ativan [Lorazepam] Other (See Comments)    Reaction:  Hallucinations and headaches  . Codeine Nausea And Vomiting  . Gabapentin Other (See Comments)    Reaction:  Unknown   . Losartan Other (See Comments)    Reaction:  Unknown   . Ondansetron Other (See Comments)    Reaction:  Unknown   . Prochlorperazine Other (See Comments)    Reaction:  Unknown   . Scopolamine Other (See Comments)    Reaction:  Unknown   . Zofran [Ondansetron Hcl] Other (See Comments)    Reaction:  hallucinations   . Oxycodone Anxiety  . Tape Rash    VITALS:  Blood pressure 162/65, pulse 65, temperature 98.1 F (36.7 C), temperature source Oral, resp. rate 12, height 5\' 5"  (1.651 m), weight 62.6 kg (138 lb 0.1 oz), SpO2 96 %.  PHYSICAL EXAMINATION:  GENERAL:  55 y.o.-year-old patient lying in the bed with no acute distress.  EYES:  Pupils equal, round, reactive to light and accommodation. No scleral icterus. Extraocular muscles intact.  HEENT: Head atraumatic, normocephalic. Oropharynx and nasopharynx clear.  NECK:  Supple, no jugular venous distention. No thyroid enlargement, no tenderness.  LUNGS: Normal breath sounds bilaterally, no wheezing, rales,rhonchi or crepitation. No use of accessory muscles of respiration.  CARDIOVASCULAR: S1, S2 normal. No murmurs, rubs, or gallops.  ABDOMEN: Soft, generalized tenderness, no rebound tenderness, nondistended. Bowel sounds present. No organomegaly or mass.  EXTREMITIES: No pedal edema, cyanosis, or clubbing.  NEUROLOGIC: Cranial nerves II through XII are intact. Muscle strength 5/5 in all extremities. Sensation intact. Gait not checked.  PSYCHIATRIC: The patient is alert and oriented x 3.  SKIN: No obvious rash, lesion, or ulcer.    LABORATORY PANEL:   CBC  Recent Labs Lab 12/20/15 0855  WBC 6.0  HGB 9.5*  HCT 30.5*  PLT 209   ------------------------------------------------------------------------------------------------------------------  Chemistries   Recent Labs Lab 12/17/15 0942  12/20/15 0855  NA 130*  < > 136  K 5.9*  < > 3.9  CL 94*  < > 98*  CO2 26  < > 29  GLUCOSE 95  < > 80  BUN 60*  < > 15  CREATININE 5.09*  < > 2.96*  CALCIUM 8.6*  < > 8.6*  AST 18  --   --   ALT 15  --   --   ALKPHOS 147*  --   --   BILITOT 1.0  --   --   < > =  values in this interval not displayed. ------------------------------------------------------------------------------------------------------------------  Cardiac Enzymes No results for input(s): TROPONINI in the last 168 hours. ------------------------------------------------------------------------------------------------------------------  RADIOLOGY:  Dg Abd 1 View  12/19/2015  CLINICAL DATA:  Status post catheter placement EXAM: ABDOMEN - 1 VIEW COMPARISON:  12/17/2015 FINDINGS: Right groin hemodialysis  catheter is identified with tip in the expected location of the IVC. There are cholecystectomy clips noted in the right upper quadrant of the abdomen. Normal bowel gas pattern. IMPRESSION: 1. Tip of the hemodialysis catheter is in the expected location of the IVC. Electronically Signed   By: Kerby Moors M.D.   On: 12/19/2015 08:37   Dg Abd Acute W/chest  12/20/2015  CLINICAL DATA:  Abdominal pain EXAM: DG ABDOMEN ACUTE W/ 1V CHEST COMPARISON:  12/17/2015 and 12/19/2015 FINDINGS: Cardiomediastinal silhouette is stable cardiomegaly again noted. Persistent mild congestion/ interstitial edema. Again noted bilateral small pleural effusion with bilateral basilar atelectasis or infiltrate. Stable right femoral dialysis catheter with tip in IVC. There is normal small bowel gas pattern. Postcholecystectomy surgical clips are noted. Some colonic gas noted in transverse colon. IMPRESSION: Persistent mild congestion/ interstitial edema. Again noted small bilateral pleural effusion with bilateral basilar atelectasis or infiltrate. Stable right femoral dialysis catheter position with tip in IVC. Normal small bowel gas pattern. Postcholecystectomy surgical clips. Electronically Signed   By: Lahoma Crocker M.D.   On: 12/20/2015 08:06    EKG:   Orders placed or performed during the hospital encounter of 11/12/15  . ED EKG  . ED EKG  . EKG 12-Lead  . EKG 12-Lead  . EKG 12-Lead  . EKG 12-Lead  . EKG 12-Lead  . EKG 12-Lead    ASSESSMENT AND PLAN:   55 year old female with end-stage renal disease on hemodialysis and gastroparesis who presents with abdominal pain and nausea and vomiting similar to gastroparesis flare.   1. Generalized abdominal pain: this is due to a combination of gastroparesis flare and constipation. Now resolved advance diet  2. ESRD : She has clotted fistula.  Temporary dialysis catheter in place, is going for declotting of the fistula   3. Accelerated hypertension:  Blood pressure  improved continue Norvasc and hydralazine  4. Diabetes type 2 Continue SSI  5. Coronary artery disease continue ASA, Plavix and Cireg  6. Hyperlipidemia continue atorvastatin        All the records are reviewed and case discussed with Care Management/Social Workerr. Management plans discussed with the patient, family and they are in agreement.  CODE STATUS: fc  TOTAL TIME TAKING CARE OF THIS PATIENT: 25 minutes.   POSSIBLE D/C IN 2  DAYS, DEPENDING ON CLINICAL CONDITION.   Dustin Flock M.D on 12/20/2015 at 2:50 PM  Between 7am to 6pm - Pager - [phone] After 6pm go to [email or web address] - password [email or web address] [place] [facility]  Office  [phone]  CC: Primary care physician; Ellamae Sia, MD

## 2015-12-20 NOTE — Clinical Social Work Note (Signed)
Physician has stated patient will discharge back to [facility] tomorrow. CSW has updated Helene Kelp at [facility]. Shela Leff MSW,LCSW [phone]

## 2015-12-20 NOTE — Progress Notes (Signed)
Report to [facility]

## 2015-12-20 NOTE — Op Note (Signed)
[place] [facility]    OPERATIVE NOTE   PROCEDURE: 1.  Left brachial artery to axillary vein arteriovenous graft cannulation under ultrasound guidance in both a retrograde and then antegrade fashion crossing 2.  Left arm shuntogram and central venogram 3.  Catheter directed thrombolysis with 4 mg of TPA delivered with the AngioJet proxy catheter 4.  Fogarty embolectomy for residual arterial plug 5.  Percutaneous transluminal angioplasty of the mid and distal graft and venous anastomosis with 7 mm diameter by 20 cm length high pressure angioplasty balloon  PRE-OPERATIVE DIAGNOSIS: 1. ESRD 2.  Thrombosed left brachial artery to axillary vein arteriovenous graft  POST-OPERATIVE DIAGNOSIS: same as above   SURGEON: Leotis Pain, MD  ANESTHESIA: local with Moderate Conscious Sedation for approximately 40 minutes using 3 mg of Versed and 100 mcg of Fentanyl  ESTIMATED BLOOD LOSS: 25 cc  FINDING(S): 1. Thrombosed graft  SPECIMEN(S):  None  CONTRAST: 20 cc  FLUORO TIME:  3.2 minutes  INDICATIONS: Patient is a 55 y.o.female who presents with a thrombosed left brachial artery to axillary vein arteriovenous graft.  The patient is scheduled for an attempted declot and shuntogram.  The patient is aware the risks include but are not limited to: bleeding, infection, thrombosis of the cannulated access, and possible anaphylactic reaction to the contrast.  The patient is aware of the risks of the procedure and elects to proceed forward.  DESCRIPTION: After full informed written consent was obtained, the patient was brought back to the angiography suite and placed supine upon the angiography table.  The patient was connected to monitoring equipment. Moderate conscious sedation was administered with a face to face encounter with the patient throughout the procedure with my supervision of the RN administering medicines and monitoring the patient's vital signs, pulse oximetry, telemetry  and mental status throughout from the start of the procedure until the patient was taken to the recovery room. The left arm was prepped and draped in the standard fashion for a percutaneous access intervention.  Under ultrasound guidance, the left brachial artery to axillary vein arteriovenous graft was cannulated with a micropuncture needle under direct ultrasound guidance due to the pulseless nature of the graft in both an antegrade and a retrograde fashion crossing, and permanent images were performed.  The microwire was advanced and the needle was exchanged for the a microsheath.  I then upsized to a 6 Fr Sheath and imaging was performed.  Hand injections were completed to image the access including the central venous system. This demonstrated no flow within the AV graft.  Based on the images, this patient will need extensive treatment to salvage the graft. I then gave the patient 4000 units of intravenous heparin.  I then placed a Magic torque wire into the brachial artery from the retrograde sheath and into the subclavian vein from the antegrade sheath. 4 mg of TPA were deployed throughout the entirety of the graft and into the brachial artery and axillary vein. This uncovered a stenosis within the graft and thrombus in the stent at the venous anastomosis.  A residual arterial plug was also seen at the arterial anastomosis. An attempt to clear the arterial plug was done with 2 passes of the Fogarty embolectomy balloon. This resulted in resolution of the arterial plug, and clearance of the arterial side of the graft. The arterial outflow was through a DRIL bypass which was still easily palpable. The retrograde sheath was removed. I then turned my attention to the thrombus in the distal  graft and the axillary vein. A 7 mm diameter by 20 cm length high pressure angioplasty balloon was inflated from the mid graft across the venous anastomosis into the axillary vein within the previously placed stent. This  resulted in patency of the graft with no hemodynamically significant residual stenosis seen.     Based on the completion imaging, no further intervention is necessary.  The wire and balloon were removed from the sheath.  A 4-0 Monocryl purse-string suture was sewn around the sheath.  The sheath was removed while tying down the suture.  A sterile bandage was applied to the puncture site.  COMPLICATIONS: None  CONDITION: Stable   [name] 12/20/2015 4:56 PM

## 2015-12-20 NOTE — Progress Notes (Signed)
Pt BP 155/48, hydralazine 25 mg scheduled. MD notified and advised to give med.

## 2015-12-20 NOTE — Progress Notes (Signed)
Subjective:   Hemodialysis yesterday through temp HD catheter. Tolerated treatment well.   Objective:  Vital signs in last 24 hours:  Temp:  [98 F (36.7 C)-98.5 F (36.9 C)] 98.4 F (36.9 C) (03/30 1247) Pulse Rate:  [60-77] 61 (03/30 1247) Resp:  [17-18] 17 (03/30 1247) BP: (115-188)/(36-92) 140/52 mmHg (03/30 1247) SpO2:  [92 %-97 %] 97 % (03/30 1247) Weight:  [62.6 kg (138 lb 0.1 oz)] 62.6 kg (138 lb 0.1 oz) (03/29 1327)  Weight change: -4.2 kg (-9 lb 4.2 oz) Filed Weights   12/18/15 1610 12/18/15 1930 12/19/15 1327  Weight: 66.8 kg (147 lb 4.3 oz) 66 kg (145 lb 8.1 oz) 62.6 kg (138 lb 0.1 oz)    Intake/Output:    Intake/Output Summary (Last 24 hours) at 12/20/15 1306 Last data filed at 12/20/15 1100  Gross per 24 hour  Intake   1650 ml  Output    450 ml  Net   1200 ml     Physical Exam: General: No acute distress, laying in bed  HEENT Anicteric, adentulous  Neck supple  Pulm/lungs Left sided crackles  CV S1S2 no rubs  Abdomen:  Soft, Non tender, BS present  Extremities: No edema  Neurologic: Awake, alert, follows commands  Skin: No acute rashes  Access: LUE AVF - no thrill or bruit       Basic Metabolic Panel:   Recent Labs Lab 12/17/15 0942 12/17/15 1227 12/18/15 0813 12/19/15 0724 12/20/15 0855  NA 130*  --  131* 133* 136  K 5.9*  --  6.1* 3.8 3.9  CL 94*  --  92* 95* 98*  CO2 26  --  27 31 29   GLUCOSE 95  --  67 58* 80  BUN 60*  --  65* 23* 15  CREATININE 5.09* 5.00* 5.90* 3.32* 2.96*  CALCIUM 8.6*  --  8.7* 7.9* 8.6*     CBC:  Recent Labs Lab 12/17/15 0942 12/17/15 1227 12/18/15 0813 12/18/15 1635 12/20/15 0855  WBC 6.9 7.1 5.7 4.9 6.0  NEUTROABS 3.8  --   --  3.0  --   HGB 9.5* 9.1* 10.7* 9.4* 9.5*  HCT 29.4* 28.7* 33.3* 28.9* 30.5*  MCV 85.3 86.0 87.4 85.6 87.8  PLT 343 318 321 291 209      Microbiology:  Recent Results (from the past 720 hour(s))  MRSA PCR Screening     Status: None   Collection Time: 12/17/15   6:59 PM  Result Value Ref Range Status   MRSA by PCR NEGATIVE NEGATIVE Final    Comment:        The GeneXpert MRSA Assay (FDA approved for NASAL specimens only), is one component of a comprehensive MRSA colonization surveillance program. It is not intended to diagnose MRSA infection nor to guide or monitor treatment for MRSA infections.     Coagulation Studies: No results for input(s): LABPROT, INR in the last 72 hours.  Urinalysis: No results for input(s): COLORURINE, LABSPEC, PHURINE, GLUCOSEU, HGBUR, BILIRUBINUR, KETONESUR, PROTEINUR, UROBILINOGEN, NITRITE, LEUKOCYTESUR in the last 72 hours.  Invalid input(s): APPERANCEUR    Imaging: Dg Abd 1 View  12/19/2015  CLINICAL DATA:  Status post catheter placement EXAM: ABDOMEN - 1 VIEW COMPARISON:  12/17/2015 FINDINGS: Right groin hemodialysis catheter is identified with tip in the expected location of the IVC. There are cholecystectomy clips noted in the right upper quadrant of the abdomen. Normal bowel gas pattern. IMPRESSION: 1. Tip of the hemodialysis catheter is in the expected location of the  IVC. Electronically Signed   By: Kerby Moors M.D.   On: 12/19/2015 08:37   Dg Abd Acute W/chest  12/20/2015  CLINICAL DATA:  Abdominal pain EXAM: DG ABDOMEN ACUTE W/ 1V CHEST COMPARISON:  12/17/2015 and 12/19/2015 FINDINGS: Cardiomediastinal silhouette is stable cardiomegaly again noted. Persistent mild congestion/ interstitial edema. Again noted bilateral small pleural effusion with bilateral basilar atelectasis or infiltrate. Stable right femoral dialysis catheter with tip in IVC. There is normal small bowel gas pattern. Postcholecystectomy surgical clips are noted. Some colonic gas noted in transverse colon. IMPRESSION: Persistent mild congestion/ interstitial edema. Again noted small bilateral pleural effusion with bilateral basilar atelectasis or infiltrate. Stable right femoral dialysis catheter position with tip in IVC. Normal small  bowel gas pattern. Postcholecystectomy surgical clips. Electronically Signed   By: Lahoma Crocker M.D.   On: 12/20/2015 08:06     Medications:   . sodium chloride     . amiodarone  200 mg Oral Daily  . amLODipine  10 mg Oral Daily  . aspirin EC  81 mg Oral Daily  . atorvastatin  20 mg Oral QPM  . carvedilol  6.25 mg Oral BID WC  . Chlorhexidine Gluconate Cloth  6 each Topical Once  . cholecalciferol  400 Units Oral Daily  . citalopram  20 mg Oral Daily  . clopidogrel  75 mg Oral Daily  . feeding supplement  1 Container Oral TID BM  . guaiFENesin  600 mg Oral BID  . heparin  5,000 Units Subcutaneous 3 times per day  . hydrALAZINE  25 mg Oral 3 times per day  . insulin aspart  0-9 Units Subcutaneous TID WC  . lactulose  30 g Oral BID  . lipase/protease/amylase  12,000 Units Oral TID WC  . metoCLOPramide (REGLAN) injection  10 mg Intravenous 3 times per day  . multivitamin  1 tablet Oral QHS  . pantoprazole  40 mg Oral Daily  . polyethylene glycol  17 g Oral Daily  . predniSONE  10 mg Oral Q breakfast  . sevelamer carbonate  800 mg Oral TID WC  . sodium chloride flush  3 mL Intravenous Q12H  . vitamin B-12  1,000 mcg Oral Daily   sodium chloride, acetaminophen **OR** acetaminophen, albuterol, bisacodyl, glucose, hydrALAZINE, HYDROcodone-acetaminophen, HYDROmorphone (DILAUDID) injection, nitroGLYCERIN, ondansetron (ZOFRAN) IV, promethazine, sodium chloride flush  Assessment/ Plan:  55 y.o. 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   [facility] MWF [place]  1. End Stage Renal Disease with hyperkalemia: MWF. With temp HD catehter - Continue MWF schedule.  - Declot planned for Thursday.   2. Anemia of chronic kidney disease:  -  holding off on epogen given right renal masses  x 2. Discontinued micera as an outpatient.   3.   Secondary Hyperparathyroidism: PTH at goal 173 as outpatient. Phosphorus and calcium at goal as outpatient.  - Not currently on binders.   4. Hypertension: pain driven. Not well controlled.  - amlodipine, carvedilol  5. Renal mass x 2.  Two small enhancing masses in upper pole of right kidney, suspicious for small renal cell carcinomas seen on abdominal CT with IV contrast on February 20 - holding epo.        LOS:  Juleen China, Lurena Nida 3/30/20171:06 PM

## 2015-12-21 LAB — CBC
HCT: 28.7 % — ABNORMAL LOW (ref 35.0–47.0)
Hemoglobin: 9.1 g/dL — ABNORMAL LOW (ref 12.0–16.0)
MCH: 27.2 pg (ref 26.0–34.0)
MCHC: 31.5 g/dL — ABNORMAL LOW (ref 32.0–36.0)
MCV: 86.2 fL (ref 80.0–100.0)
Platelets: 198 10*3/uL (ref 150–440)
RBC: 3.33 MIL/uL — ABNORMAL LOW (ref 3.80–5.20)
RDW: 24.4 % — ABNORMAL HIGH (ref 11.5–14.5)
WBC: 7.8 10*3/uL (ref 3.6–11.0)

## 2015-12-21 LAB — RENAL FUNCTION PANEL
Albumin: 2.5 g/dL — ABNORMAL LOW (ref 3.5–5.0)
Anion gap: 7 (ref 5–15)
BUN: 22 mg/dL — ABNORMAL HIGH (ref 6–20)
CO2: 29 mmol/L (ref 22–32)
Calcium: 8.2 mg/dL — ABNORMAL LOW (ref 8.9–10.3)
Chloride: 97 mmol/L — ABNORMAL LOW (ref 101–111)
Creatinine, Ser: 4.13 mg/dL — ABNORMAL HIGH (ref 0.44–1.00)
GFR calc Af Amer: 13 mL/min — ABNORMAL LOW (ref >60)
GFR calc non Af Amer: 11 mL/min — ABNORMAL LOW (ref >60)
Glucose, Bld: 112 mg/dL — ABNORMAL HIGH (ref 65–99)
Phosphorus: 5 mg/dL — ABNORMAL HIGH (ref 2.5–4.6)
Potassium: 4.3 mmol/L (ref 3.5–5.1)
Sodium: 133 mmol/L — ABNORMAL LOW (ref 135–145)

## 2015-12-21 LAB — GLUCOSE, CAPILLARY
Glucose-Capillary: 98 mg/dL (ref 65–99)
Glucose-Capillary: 169 mg/dL — ABNORMAL HIGH (ref 65–99)

## 2015-12-21 MED ORDER — HYDROCODONE-ACETAMINOPHEN 5-325 MG PO TABS: 1.0000 | ORAL_TABLET | ORAL | Status: DC | PRN

## 2015-12-21 MED ORDER — METOCLOPRAMIDE HCL 10 MG PO TABS: 10.0000 mg | ORAL_TABLET | Freq: Three times a day (TID) | ORAL | Status: DC

## 2015-12-21 MED ORDER — LACTULOSE 10 GM/15ML PO SOLN: 30.0000 g | Freq: Two times a day (BID) | ORAL | Status: DC

## 2015-12-21 MED ORDER — VITAMIN D3 10 MCG (400 UNIT) PO TABS: 400.0000 [IU] | ORAL_TABLET | Freq: Every day | ORAL | Status: DC

## 2015-12-21 MED ORDER — HYDRALAZINE HCL 25 MG PO TABS: 25.0000 mg | ORAL_TABLET | Freq: Three times a day (TID) | ORAL | Status: DC

## 2015-12-21 NOTE — Progress Notes (Signed)
Subjective:   Seen and examined on hemodialysis. Tolerating treatment well. Using AVG. UF of 0.5Litre  Objective:  Vital signs in last 24 hours:  Temp:  [98 F (36.7 C)-98.5 F (36.9 C)] 98.1 F (36.7 C) (03/31 0920) Pulse Rate:  [60-71] 63 (03/31 0930) Resp:  [12-20] 14 (03/31 0930) BP: (140-162)/(48-67) 155/54 mmHg (03/31 0930) SpO2:  [93 %-100 %] 100 % (03/31 0920)  Weight change:  Filed Weights   12/18/15 1610 12/18/15 1930 12/19/15 1327  Weight: 66.8 kg (147 lb 4.3 oz) 66 kg (145 lb 8.1 oz) 62.6 kg (138 lb 0.1 oz)    Intake/Output:    Intake/Output Summary (Last 24 hours) at 12/21/15 1119 Last data filed at 12/21/15 0700  Gross per 24 hour  Intake    560 ml  Output     50 ml  Net    510 ml     Physical Exam: General: No acute distress, laying in bed  HEENT Anicteric, adentulous  Neck supple  Pulm/lungs crackles  CV regular  Abdomen:  Soft, Non tender, BS present  Extremities: No edema  Neurologic: Awake, alert, follows commands  Skin: No acute rashes  Access: LUE AVF, right femoral temp HD catheter       Basic Metabolic Panel:   Recent Labs Lab 12/17/15 0942 12/17/15 1227 12/18/15 0813 12/19/15 0724 12/20/15 0855  NA 130*  --  131* 133* 136  K 5.9*  --  6.1* 3.8 3.9  CL 94*  --  92* 95* 98*  CO2 26  --  27 31 29   GLUCOSE 95  --  67 58* 80  BUN 60*  --  65* 23* 15  CREATININE 5.09* 5.00* 5.90* 3.32* 2.96*  CALCIUM 8.6*  --  8.7* 7.9* 8.6*     CBC:  Recent Labs Lab 12/17/15 0942 12/17/15 1227 12/18/15 0813 12/18/15 1635 12/20/15 0855 12/21/15 0937  WBC 6.9 7.1 5.7 4.9 6.0 7.8  NEUTROABS 3.8  --   --  3.0  --   --   HGB 9.5* 9.1* 10.7* 9.4* 9.5* 9.1*  HCT 29.4* 28.7* 33.3* 28.9* 30.5* 28.7*  MCV 85.3 86.0 87.4 85.6 87.8 86.2  PLT 343 318 321 291 209 198      Microbiology:  Recent Results (from the past 720 hour(s))  MRSA PCR Screening     Status: None   Collection Time: 12/17/15  6:59 PM  Result Value Ref Range Status   MRSA by PCR NEGATIVE NEGATIVE Final    Comment:        The GeneXpert MRSA Assay (FDA approved for NASAL specimens only), is one component of a comprehensive MRSA colonization surveillance program. It is not intended to diagnose MRSA infection nor to guide or monitor treatment for MRSA infections.     Coagulation Studies: No results for input(s): LABPROT, INR in the last 72 hours.  Urinalysis: No results for input(s): COLORURINE, LABSPEC, PHURINE, GLUCOSEU, HGBUR, BILIRUBINUR, KETONESUR, PROTEINUR, UROBILINOGEN, NITRITE, LEUKOCYTESUR in the last 72 hours.  Invalid input(s): APPERANCEUR    Imaging: Dg Abd Acute W/chest  12/20/2015  CLINICAL DATA:  Abdominal pain EXAM: DG ABDOMEN ACUTE W/ 1V CHEST COMPARISON:  12/17/2015 and 12/19/2015 FINDINGS: Cardiomediastinal silhouette is stable cardiomegaly again noted. Persistent mild congestion/ interstitial edema. Again noted bilateral small pleural effusion with bilateral basilar atelectasis or infiltrate. Stable right femoral dialysis catheter with tip in IVC. There is normal small bowel gas pattern. Postcholecystectomy surgical clips are noted. Some colonic gas noted in transverse colon. IMPRESSION: Persistent  mild congestion/ interstitial edema. Again noted small bilateral pleural effusion with bilateral basilar atelectasis or infiltrate. Stable right femoral dialysis catheter position with tip in IVC. Normal small bowel gas pattern. Postcholecystectomy surgical clips. Electronically Signed   By: Lahoma Crocker M.D.   On: 12/20/2015 08:06     Medications:     . amLODipine  10 mg Oral Daily  . aspirin EC  81 mg Oral Daily  . atorvastatin  20 mg Oral QPM  . carvedilol  6.25 mg Oral BID WC  . cholecalciferol  400 Units Oral Daily  . citalopram  20 mg Oral Daily  . clopidogrel  75 mg Oral Daily  . feeding supplement  1 Container Oral TID BM  . guaiFENesin  600 mg Oral BID  . heparin  5,000 Units Subcutaneous 3 times per day  . hydrALAZINE   25 mg Oral 3 times per day  . insulin aspart  0-9 Units Subcutaneous TID WC  . lactulose  30 g Oral BID  . lipase/protease/amylase  12,000 Units Oral TID WC  . metoCLOPramide (REGLAN) injection  10 mg Intravenous 3 times per day  . multivitamin  1 tablet Oral QHS  . pantoprazole  40 mg Oral Daily  . polyethylene glycol  17 g Oral Daily  . predniSONE  10 mg Oral Q breakfast  . sevelamer carbonate  800 mg Oral TID WC  . sodium chloride flush  3 mL Intravenous Q12H  . vitamin B-12  1,000 mcg Oral Daily   sodium chloride, acetaminophen **OR** acetaminophen, albuterol, bisacodyl, glucose, hydrALAZINE, HYDROcodone-acetaminophen, HYDROmorphone (DILAUDID) injection, nitroGLYCERIN, ondansetron (ZOFRAN) IV, promethazine, sodium chloride flush  Assessment/ Plan:  55 y.o. 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   [facility] MWF [place]  1. End Stage Renal Disease with hyperkalemia: MWF. AVG working well. Declot yesterday.  Seen and examined on hemodialysis. Tolerating treatment well.  - Continue MWF schedule.  - remove temp HD catheter  2. Anemia of chronic kidney disease:  -  holding off on epogen given right renal masses x 2. Discontinued micera as an outpatient.   3.   Secondary Hyperparathyroidism: PTH at goal 173 as outpatient. Phosphorus and calcium at goal as outpatient.  - Not currently on binders.   4. Hypertension:  - amlodipine, carvedilol  5. Renal mass x 2.  Two small enhancing masses in upper pole of right kidney, suspicious for small renal cell carcinomas seen on abdominal CT with IV contrast on February 20 - holding epo.        LOS:  Starleen Trussell, Lurena Nida 3/31/201711:19 AM

## 2015-12-21 NOTE — Progress Notes (Signed)
TX started 

## 2015-12-21 NOTE — Clinical Social Work Note (Signed)
Patient to discharge to return to [facility] today. Nurse to call report. Discharge information sent.  Shela Leff MSW,LCSW [phone]

## 2015-12-21 NOTE — Progress Notes (Signed)
PRE HD   

## 2015-12-21 NOTE — Progress Notes (Signed)
Pt discharged to facility. Report called to [facility] at [facility]. R Femoral Cath removed. EMS called. Awaiting arrival. Concerns addressed.

## 2015-12-21 NOTE — Discharge Summary (Addendum)
[name], 55 y.o., DOB 01-31-1961, MRN [id number]. Admission date: 12/17/2015 Discharge Date 12/21/2015 Primary MD Ellamae Sia, MD Admitting Physician Dustin Flock, MD  Admission Diagnosis   Acute hyperkalemia [E87.5] Nausea [R11.0] Right upper quadrant pain [R10.11] Congestive heart failure, unspecified congestive heart failure chronicity, unspecified congestive heart failure type ([place]) [I50.9]  Discharge Diagnosis   Active Problems:   Abdominal pain due to gastro paresis   Hyperkalemia   End-stage renal disease with clotted AV fistula status post embolectomy Acute on chronic diastolic CHF   Diabetes type 2   Asthma Collagen vascular disease Coronary artery disease Diabetic neuropathy GERD History of pancreatitis MI Mitral regurg COPD Anxiety Depression      Hospital Course [name] is a 55 y.o. female with a known history of recurrent admission for nausea vomiting and right upper quadrant abdominal pain felt to be due to gastroparesis was presenting with same symptoms. Patient was admitted as observation for the symptoms. She was symptomatically treated with resolution of her symptoms. However patient also was scheduled to have dialysis and had difficulty with her AV fistula therefore she had to have a temporary catheter placed. Subsequently she underwent embolectomy of her AV fistula and now is functional.              Consults nephrology and vascular surgery Significant Tests:  See full reports for all details    Dg Chest 2 View  11/28/2015  CLINICAL DATA:  55 year old female status post left thoracentesis EXAM: CHEST  2 VIEW COMPARISON:  Pre thoracentesis chest x-ray 11/24/2006 FINDINGS: No evidence of left-sided pneumothorax. Significant interval decrease in the left layering pleural effusion. There is some residual atelectasis in the left base. Persistent moderate right pleural effusion with associated atelectasis. Cardiomegaly and pulmonary vascular  congestion. Left subclavian approach central venous catheter with the tip overlying the superior cavoatrial junction. Overlapping stents are present in the left subclavian and axillary veins. IMPRESSION: 1. No evidence of pneumothorax status post left-sided thoracentesis. 2. Significant interval decrease in the size of left pleural effusion. There is some persistent residual atelectasis of the left lung base. 3. Unchanged moderate layering right pleural effusion with associated right basilar atelectasis. 4. Cardiomegaly and pulmonary vascular congestion bordering on mild edema. Electronically Signed   By: Jacqulynn Cadet M.D.   On: 11/28/2015 10:25   Dg Chest 2 View  11/24/2015  CLINICAL DATA:  End-stage renal disease. Follow-up of pleural effusion. EXAM: CHEST  2 VIEW COMPARISON:  CT of 11/21/2015.  Plain film of 11/18/2015. FINDINGS: Extubation since the prior plain film. A left-sided subclavian line terminates at the cavoatrial junction. Left sided axillary stent. Midline trachea. Cardiomegaly accentuated by AP portable technique. left-sided pleural effusion is similar, small to moderate. Right-sided pleural effusion is slightly increased, moderate. No pneumothorax. Interstitial edema is moderate and increased. Accentuated by AP portable technique. Bibasilar airspace disease persists. IMPRESSION: Slight increase in moderate congestive heart failure since 11/18/2015. Increased right and similar left -sided pleural effusions. Bibasilar airspace disease which could represent atelectasis or infection. Electronically Signed   By: Abigail Miyamoto M.D.   On: 11/24/2015 11:58   Dg Abd 1 View  12/19/2015  CLINICAL DATA:  Status post catheter placement EXAM: ABDOMEN - 1 VIEW COMPARISON:  12/17/2015 FINDINGS: Right groin hemodialysis catheter is identified with tip in the expected location of the IVC. There are cholecystectomy clips noted in the right upper quadrant of the abdomen. Normal bowel gas pattern. IMPRESSION:  1. Tip of the hemodialysis catheter  is in the expected location of the IVC. Electronically Signed   By: Kerby Moors M.D.   On: 12/19/2015 08:37   Ct Angio Chest Pe W/cm &/or Wo Cm  11/21/2015  CLINICAL DATA:  Blood in sputum. Concern for pulmonary embolism. Dialysis patient. COPD, asthma, and coronary disease. EXAM: CT ANGIOGRAPHY CHEST WITH CONTRAST TECHNIQUE: Multidetector CT imaging of the chest was performed using the standard protocol during bolus administration of intravenous contrast. Multiplanar CT image reconstructions and MIPs were obtained to evaluate the vascular anatomy. CONTRAST:  49mL OMNIPAQUE IOHEXOL 350 MG/ML SOLN COMPARISON:  Chest CT 10/10/2015 FINDINGS: Mediastinum/Nodes: No filling defects within the pulmonary to suggest acute pulmonary embolism. No acute findings aorta or great vessels. No pericardial effusion. Mild mediastinal lymphadenopathy. No axillary or supraclavicular adenopathy. Lungs/Pleura: Large bilateral layering pleural effusions. Passive atelectasis of the LEFT and RIGHT lower lobe. There is patchy airspace disease in the RIGHT upper lobe which reaches confluence at the apex with masslike consolidation measuring 3.5 by 2.6 cm (image 20, series 6). Some patchy airspace disease in the inferior lingula additionally. Upper abdomen: Limited view of the liver, kidneys, pancreas are unremarkable. Normal adrenal glands. Musculoskeletal: Diffuse sclerosis the bones.  Acute findings. Review of the MIP images confirms the above findings. IMPRESSION: 1. No acute pulmonary embolism. 2. Large bilateral pleural effusions and lower lobe atelectasis. 3. Bilateral patchy airspace disease suggesting MULTIFOCAL PNEUMONIA versus aspiration pneumonitis. 4. Consolidation in the RIGHT upper low most suggestive of pulmonary infection. 5. Mild mediastinal adenopathy favored reactive. Electronically Signed   By: Suzy Bouchard M.D.   On: 11/21/2015 15:17   Dg Abd Acute W/chest  12/20/2015   CLINICAL DATA:  Abdominal pain EXAM: DG ABDOMEN ACUTE W/ 1V CHEST COMPARISON:  12/17/2015 and 12/19/2015 FINDINGS: Cardiomediastinal silhouette is stable cardiomegaly again noted. Persistent mild congestion/ interstitial edema. Again noted bilateral small pleural effusion with bilateral basilar atelectasis or infiltrate. Stable right femoral dialysis catheter with tip in IVC. There is normal small bowel gas pattern. Postcholecystectomy surgical clips are noted. Some colonic gas noted in transverse colon. IMPRESSION: Persistent mild congestion/ interstitial edema. Again noted small bilateral pleural effusion with bilateral basilar atelectasis or infiltrate. Stable right femoral dialysis catheter position with tip in IVC. Normal small bowel gas pattern. Postcholecystectomy surgical clips. Electronically Signed   By: Lahoma Crocker M.D.   On: 12/20/2015 08:06   Dg Abd Acute W/chest  12/17/2015  CLINICAL DATA:  Nausea and abdominal pain.  Chronic renal failure. EXAM: DG ABDOMEN ACUTE W/ 1V CHEST COMPARISON:  Chest radiograph November 28, 2015; abdominal radiograph November 17, 2015 FINDINGS: PA chest: There are bilateral pleural effusions with bibasilar atelectasis. There is mild cardiomegaly with pulmonary venous hypertension. No frank edema. There is a stent in the subclavian and axillary regions on the left. Supine and upright abdomen: There is diffuse stool throughout colon. There is no bowel dilatation or air-fluid level suggesting obstruction. No free air. There is extensive vascular calcification. There are surgical clips in right upper quadrant region. IMPRESSION: Diffuse stool throughout colon. No bowel obstruction or free air. Evidence of a degree of congestive heart failure. Electronically Signed   By: Lowella Grip III M.D.   On: 12/17/2015 10:27   US Thoracentesis Asp Pleural Space W/img Guide  11/28/2015  INDICATION: 55 year old female with end-stage renal disease on hemodialysis. She has bilateral  moderate pleural effusions. After sonographic evaluation, the effusions are relatively symmetric in size although the right contains a few thin internal septations. Therefore, a left-sided  thoracentesis will be performed. EXAM: ULTRASOUND GUIDED LEFT THORACENTESIS MEDICATIONS: None. COMPLICATIONS: None immediate. PROCEDURE: An ultrasound guided thoracentesis was thoroughly discussed with the patient and questions answered. The benefits, risks, alternatives and complications were also discussed. The patient understands and wishes to proceed with the procedure. Written consent was obtained. Ultrasound was performed to localize and mark an adequate pocket of fluid in the left chest. The area was then prepped and draped in the normal sterile fashion. 1% Lidocaine was used for local anesthesia. Under ultrasound guidance a 6 Fr Safe-T-Centesis catheter was introduced. Thoracentesis was performed. The catheter was removed and a dressing applied. FINDINGS: A total of approximately 950 mL of dark amber colored fluid was removed. Samples were sent to the laboratory as requested by the clinical team. IMPRESSION: Successful ultrasound guided left thoracentesis yielding 950 mL of pleural fluid. Electronically Signed   By: Jacqulynn Cadet M.D.   On: 11/28/2015 10:13       Today   Subjective:   [name]  feels better nausea vomiting resolved tolerating diet tolerating diet Objective:   Blood pressure 155/54, pulse 60, temperature 98.1 F (36.7 C), temperature source Oral, resp. rate 14, height 5\' 5"  (1.651 m), weight 62.6 kg (138 lb 0.1 oz), SpO2 100 %.  .  Intake/Output Summary (Last 24 hours) at 12/21/15 1413 Last data filed at 12/21/15 1235  Gross per 24 hour  Intake    560 ml  Output    550 ml  Net     10 ml    Exam VITAL SIGNS: Blood pressure 155/54, pulse 60, temperature 98.1 F (36.7 C), temperature source Oral, resp. rate 14, height 5\' 5"  (1.651 m), weight 62.6 kg (138 lb 0.1 oz), SpO2 100  %.  GENERAL:  55 y.o.-year-old patient lying in the bed with no acute distress.  EYES: Pupils equal, round, reactive to light and accommodation. No scleral icterus. Extraocular muscles intact.  HEENT: Head atraumatic, normocephalic. Oropharynx and nasopharynx clear.  NECK:  Supple, no jugular venous distention. No thyroid enlargement, no tenderness.  LUNGS: Normal breath sounds bilaterally, no wheezing, rales,rhonchi or crepitation. No use of accessory muscles of respiration.  CARDIOVASCULAR: S1, S2 normal. No murmurs, rubs, or gallops.  ABDOMEN: Soft, nontender, nondistended. Bowel sounds present. No organomegaly or mass.  EXTREMITIES: No pedal edema, cyanosis, or clubbing.  NEUROLOGIC: Cranial nerves II through XII are intact. Muscle strength 5/5 in all extremities. Sensation intact. Gait not checked.  PSYCHIATRIC: The patient is alert and oriented x 3.  SKIN: No obvious rash, lesion, or ulcer.   Data Review     CBC w Diff:  Lab Results  Component Value Date   WBC 7.8 12/21/2015   WBC 10.0 09/15/2014   HGB 9.1* 12/21/2015   HGB 10.5* 09/15/2014   HCT 28.7* 12/21/2015   HCT 34.2* 09/15/2014   PLT 198 12/21/2015   PLT 203 09/15/2014   LYMPHOPCT 34 12/18/2015   LYMPHOPCT 51.6 05/31/2014   BANDSPCT 1 12/17/2015   MONOPCT 3 12/18/2015   MONOPCT 9.6 05/31/2014   EOSPCT 0 12/18/2015   EOSPCT 4.6 05/31/2014   BASOPCT 1 12/18/2015   BASOPCT 0.4 05/31/2014   CMP:  Lab Results  Component Value Date   NA 133* 12/21/2015   NA 135* 09/15/2014   K 4.3 12/21/2015   K 4.8 09/15/2014   CL 97* 12/21/2015   CL 99 09/15/2014   CO2 29 12/21/2015   CO2 26 09/15/2014   BUN 22* 12/21/2015   BUN 19* 09/15/2014  CREATININE 4.13* 12/21/2015   CREATININE 6.79* 09/15/2014   PROT 7.6 12/17/2015   PROT 7.2 09/15/2014   ALBUMIN 2.5* 12/21/2015   ALBUMIN 3.0* 09/15/2014   BILITOT 1.0 12/17/2015   BILITOT 0.5 09/15/2014   ALKPHOS 147* 12/17/2015   ALKPHOS 105 09/15/2014   AST 18  12/17/2015   AST 43* 09/15/2014   ALT 15 12/17/2015   ALT 16 09/15/2014  .  Micro Results Recent Results (from the past 240 hour(s))  MRSA PCR Screening     Status: None   Collection Time: 12/17/15  6:59 PM  Result Value Ref Range Status   MRSA by PCR NEGATIVE NEGATIVE Final    Comment:        The GeneXpert MRSA Assay (FDA approved for NASAL specimens only), is one component of a comprehensive MRSA colonization surveillance program. It is not intended to diagnose MRSA infection nor to guide or monitor treatment for MRSA infections.         Code Status Orders        Start     Ordered   12/17/15 1139  Full code   Continuous     12/17/15 1139    Code Status History    Date Active Date Inactive Code Status Order ID Comments User Context   11/12/2015  4:02 PM 12/01/2015  7:43 PM Full Code [id number]  Bettey Costa, MD Inpatient   10/07/2015  9:37 AM 10/12/2015  7:59 PM Full Code [id number]  Fritzi Mandes, MD ED   10/03/2015  3:55 AM 10/05/2015 10:30 PM Full Code [id number]  Lance Coon, MD Inpatient   08/31/2015  4:00 PM 09/03/2015 10:09 PM Full Code [id number]  Demetrios Loll, MD ED   07/26/2015  3:28 PM 07/26/2015  9:36 PM Full Code [id number]  Dionisio David, MD Inpatient   07/22/2015  5:36 PM 07/26/2015  3:28 PM Full Code [id number]  Vaughan Basta, MD Inpatient   06/05/2015  8:39 PM 06/07/2015  8:03 PM Full Code [id number]  Bettey Costa, MD Inpatient   05/21/2015  9:42 PM 05/24/2015  4:26 PM Full Code [id number]  Henreitta Leber, MD Inpatient   04/24/2015  8:55 AM 04/26/2015 10:11 PM Full Code [id number]  Demetrios Loll, MD Inpatient   04/01/2015  3:58 PM 04/05/2015  8:07 PM Full Code [id number]  Epifanio Lesches, MD ED   03/06/2015  8:05 AM 03/09/2015  9:11 PM Full Code [id number]  Juluis Mire, MD Inpatient   02/19/2015  4:14 PM 02/24/2015  5:41 PM Full Code [id number]  Aldean Jewett, MD Inpatient    Advance Directive Documentation        Most Recent Value   Type of Advance Directive  Healthcare  Power of [place], Living will   Pre-existing out of facility DNR order (yellow form or pink MOST form)     "MOST" Form in Place?                Follow-up Information    Follow up with Burns, Ala Dach, MD In 7 days.   Specialty:  Internal Medicine   Why:  Please call and schedule your hospital-follow-up.    Contact information:   [place] [address] [phone]       Discharge Medications     Medication List    STOP taking these medications        insulin regular 100 units/mL injection  Commonly known as:  NOVOLIN R,HUMULIN R      TAKE these medications  albuterol (2.5 MG/3ML) 0.083% nebulizer solution  Commonly known as:  PROVENTIL  Take 2.5 mg by nebulization every 4 (four) hours as needed for wheezing or shortness of breath.     amLODipine 10 MG tablet  Commonly known as:  NORVASC  Take 10 mg by mouth daily.     aspirin EC 81 MG tablet  Take 81 mg by mouth daily.     atorvastatin 20 MG tablet  Commonly known as:  LIPITOR  Take 20 mg by mouth every evening.     b complex-vitamin c-folic acid 0.8 MG Tabs tablet  Take 1 tablet by mouth daily.     bisacodyl 5 MG EC tablet  Commonly known as:  DULCOLAX  Take 1 tablet (5 mg total) by mouth daily as needed for moderate constipation.     carvedilol 3.125 MG tablet  Commonly known as:  COREG  Take 3.125 mg by mouth 2 (two) times daily with a meal.     cetirizine 10 MG tablet  Commonly known as:  ZYRTEC  Take 10 mg by mouth daily.     Cholecalciferol 1000 units Tbdp  Take 1,000 Units by mouth daily.     Vitamin D3 400 units tablet  Take 1 tablet (400 Units total) by mouth daily.     citalopram 20 MG tablet  Commonly known as:  CELEXA  Take 20 mg by mouth daily.     clopidogrel 75 MG tablet  Commonly known as:  PLAVIX  Take 75 mg by mouth daily. Reported on 12/17/2015     glucose 4 GM chewable tablet  Chew 3-4 tablets by mouth as needed for low blood sugar (if  pts blood sugar is less than 60).     hydrALAZINE 25 MG tablet  Commonly known as:  APRESOLINE  Take 1 tablet (25 mg total) by mouth every 8 (eight) hours.     HYDROcodone-acetaminophen 5-325 MG tablet  Commonly known as:  NORCO/VICODIN  Take 1 tablet by mouth every 6 (six) hours as needed for moderate pain.     hydrocortisone 25 MG suppository  Commonly known as:  ANUSOL-HC  Place 25 mg rectally 2 (two) times daily.     insulin aspart 100 UNIT/ML FlexPen  Commonly known as:  NOVOLOG  Inject into the skin. Take as directed     insulin detemir 100 UNIT/ML injection  Commonly known as:  LEVEMIR  Inject 6 Units into the skin at bedtime.     lactulose 10 GM/15ML solution  Commonly known as:  CHRONULAC  Take 45 mLs (30 g total) by mouth 2 (two) times daily.     lipase/protease/amylase 12000 units Cpep capsule  Commonly known as:  CREON  Take 12,000 Units by mouth 3 (three) times daily with meals.     meclizine 25 MG tablet  Commonly known as:  ANTIVERT  Take 25 mg by mouth 2 (two) times daily.     metoCLOPramide 5 MG tablet  Commonly known as:  REGLAN  Take 5 mg by mouth 3 (three) times daily.     metoCLOPramide 10 MG tablet  Commonly known as:  REGLAN  Take 1 tablet (10 mg total) by mouth 3 (three) times daily before meals.     metoprolol 50 MG tablet  Commonly known as:  LOPRESSOR  Take 50 mg by mouth 2 (two) times daily.     nitroGLYCERIN 0.4 MG SL tablet  Commonly known as:  NITROSTAT  Place 0.4 mg under the tongue every 5 (  five) minutes x 3 doses as needed for chest pain. *Max 3 doses per episode*     omega-3 acid ethyl esters 1 g capsule  Commonly known as:  LOVAZA  Take 1 g by mouth daily.     ondansetron 4 MG tablet  Commonly known as:  ZOFRAN  Take 4 mg by mouth every 8 (eight) hours as needed for nausea or vomiting.     polyethylene glycol packet  Commonly known as:  MIRALAX / GLYCOLAX  Take 17 g by mouth daily as needed.     promethazine 12.5 MG  tablet  Commonly known as:  PHENERGAN  1-2 tablets by mouth every 8 hours as needed for nausea and vomiting     ranitidine 150 MG tablet  Commonly known as:  ZANTAC  Take 150 mg by mouth 2 (two) times daily as needed for heartburn.     RENATABS 1 MG Tabs  Take 1 mg by mouth daily.     sevelamer carbonate 800 MG tablet  Commonly known as:  RENVELA  Take 1,600 mg by mouth 3 (three) times daily with meals.           Total Time in preparing paper work, data evaluation and todays exam - 35 minutes  Dustin Flock M.D on 12/21/2015 at 2:13 [facility]  [facility]   Office  [phone]

## 2015-12-21 NOTE — Progress Notes (Signed)
EMS arrived and declined transport to facility r/t pt having to 'pay out of pocket' for transportation, despite the pt son approval for transport. Pt son was notified before and after EMS arrived and transported pt himself to facility.

## 2015-12-21 NOTE — Discharge Instructions (Signed)
°  DIET:  Renal diet, diabeic diet  DISCHARGE CONDITION:  Stable  ACTIVITY:  Activity as tolerated  OXYGEN:  Home Oxygen: No.   Oxygen Delivery: room air  DISCHARGE LOCATION:  home    ADDITIONAL DISCHARGE INSTRUCTION:   If you experience worsening of your admission symptoms, develop shortness of breath, life threatening emergency, suicidal or homicidal thoughts you must seek medical attention immediately by calling 911 or calling your MD immediately  if symptoms less severe.  You Must read complete instructions/literature along with all the possible adverse reactions/side effects for all the Medicines you take and that have been prescribed to you. Take any new Medicines after you have completely understood and accpet all the possible adverse reactions/side effects.   Please note  You were cared for by a hospitalist during your hospital stay. If you have any questions about your discharge medications or the care you received while you were in the hospital after you are discharged, you can call the unit and asked to speak with the hospitalist on call if the hospitalist that took care of you is not available. Once you are discharged, your primary care physician will handle any further medical issues. Please note that NO REFILLS for any discharge medications will be authorized once you are discharged, as it is imperative that you return to your primary care physician (or establish a relationship with a primary care physician if you do not have one) for your aftercare needs so that they can reassess your need for medications and monitor your lab values.

## 2015-12-21 NOTE — Care Management Important Message (Signed)
Important Message  Patient Details  Name: JOSELIN SCHOENDORF MRN: [id number] Date of Birth: 06-04-1961   Medicare Important Message Given:  Yes    Beverly Sessions, RN 12/21/2015, 10:59 AM

## 2015-12-24 ENCOUNTER — Encounter: Payer: Self-pay | Admitting: Vascular Surgery

## 2015-12-25 ENCOUNTER — Ambulatory Visit: Payer: Medicare Other | Admitting: Urology

## 2015-12-25 ENCOUNTER — Telehealth: Payer: Self-pay | Admitting: Urology

## 2015-12-25 DIAGNOSIS — N2889 Other specified disorders of kidney and ureter: Secondary | ICD-10-CM

## 2015-12-25 NOTE — Telephone Encounter (Signed)
Patient has a follow up appt with you in October and will need an order for her CT scan so that we can get that done prior to this appt.   Thanks,   Sharyn Lull

## 2015-12-26 NOTE — Telephone Encounter (Signed)
Done ° ° °michelle °

## 2015-12-28 ENCOUNTER — Encounter: Payer: Self-pay | Admitting: Emergency Medicine

## 2015-12-28 ENCOUNTER — Emergency Department
Admission: EM | Admit: 2015-12-28 | Discharge: 2015-12-29 | Disposition: A | Discharge status: Home / Self Care | Payer: Medicare Other | Attending: Emergency Medicine | Admitting: Emergency Medicine

## 2015-12-28 DIAGNOSIS — Z87891 Personal history of nicotine dependence: Secondary | ICD-10-CM | POA: Insufficient documentation

## 2015-12-28 DIAGNOSIS — E11649 Type 2 diabetes mellitus with hypoglycemia without coma: Secondary | ICD-10-CM | POA: Diagnosis not present

## 2015-12-28 DIAGNOSIS — E1140 Type 2 diabetes mellitus with diabetic neuropathy, unspecified: Secondary | ICD-10-CM | POA: Diagnosis not present

## 2015-12-28 DIAGNOSIS — I5032 Chronic diastolic (congestive) heart failure: Secondary | ICD-10-CM | POA: Diagnosis not present

## 2015-12-28 DIAGNOSIS — J9601 Acute respiratory failure with hypoxia: Secondary | ICD-10-CM | POA: Insufficient documentation

## 2015-12-28 DIAGNOSIS — I132 Hypertensive heart and chronic kidney disease with heart failure and with stage 5 chronic kidney disease, or end stage renal disease: Secondary | ICD-10-CM | POA: Insufficient documentation

## 2015-12-28 DIAGNOSIS — Z992 Dependence on renal dialysis: Secondary | ICD-10-CM | POA: Insufficient documentation

## 2015-12-28 DIAGNOSIS — J45909 Unspecified asthma, uncomplicated: Secondary | ICD-10-CM | POA: Insufficient documentation

## 2015-12-28 DIAGNOSIS — Z79899 Other long term (current) drug therapy: Secondary | ICD-10-CM | POA: Diagnosis not present

## 2015-12-28 DIAGNOSIS — F329 Major depressive disorder, single episode, unspecified: Secondary | ICD-10-CM | POA: Diagnosis not present

## 2015-12-28 DIAGNOSIS — Z7982 Long term (current) use of aspirin: Secondary | ICD-10-CM | POA: Diagnosis not present

## 2015-12-28 DIAGNOSIS — N186 End stage renal disease: Secondary | ICD-10-CM | POA: Diagnosis not present

## 2015-12-28 DIAGNOSIS — Z794 Long term (current) use of insulin: Secondary | ICD-10-CM | POA: Insufficient documentation

## 2015-12-28 DIAGNOSIS — Z8679 Personal history of other diseases of the circulatory system: Secondary | ICD-10-CM | POA: Diagnosis not present

## 2015-12-28 DIAGNOSIS — Z7901 Long term (current) use of anticoagulants: Secondary | ICD-10-CM | POA: Diagnosis not present

## 2015-12-28 DIAGNOSIS — N179 Acute kidney failure, unspecified: Secondary | ICD-10-CM | POA: Insufficient documentation

## 2015-12-28 DIAGNOSIS — R040 Epistaxis: Secondary | ICD-10-CM | POA: Insufficient documentation

## 2015-12-28 DIAGNOSIS — E1122 Type 2 diabetes mellitus with diabetic chronic kidney disease: Secondary | ICD-10-CM | POA: Diagnosis not present

## 2015-12-28 DIAGNOSIS — M359 Systemic involvement of connective tissue, unspecified: Secondary | ICD-10-CM | POA: Insufficient documentation

## 2015-12-28 NOTE — ED Notes (Signed)
Lab called for venipuncture assistance.

## 2015-12-28 NOTE — ED Notes (Signed)
Warm blankets provided.

## 2015-12-28 NOTE — ED Notes (Signed)
Pt states bilateral nare bleeding that started after sneezing at 1400 today. Pt provided with pinchers to control bleeding. Skin normal color warm and dry.

## 2015-12-29 LAB — COMPREHENSIVE METABOLIC PANEL
ALT: 15 U/L (ref 14–54)
AST: 22 U/L (ref 15–41)
Albumin: 2.6 g/dL — ABNORMAL LOW (ref 3.5–5.0)
Alkaline Phosphatase: 80 U/L (ref 38–126)
Anion gap: 6 (ref 5–15)
BUN: 17 mg/dL (ref 6–20)
CO2: 31 mmol/L (ref 22–32)
Calcium: 7.9 mg/dL — ABNORMAL LOW (ref 8.9–10.3)
Chloride: 96 mmol/L — ABNORMAL LOW (ref 101–111)
Creatinine, Ser: 2.48 mg/dL — ABNORMAL HIGH (ref 0.44–1.00)
GFR calc Af Amer: 24 mL/min — ABNORMAL LOW (ref >60)
GFR calc non Af Amer: 21 mL/min — ABNORMAL LOW (ref >60)
Glucose, Bld: 222 mg/dL — ABNORMAL HIGH (ref 65–99)
Potassium: 3.3 mmol/L — ABNORMAL LOW (ref 3.5–5.1)
Sodium: 133 mmol/L — ABNORMAL LOW (ref 135–145)
Total Bilirubin: 0.5 mg/dL (ref 0.3–1.2)
Total Protein: 6.8 g/dL (ref 6.5–8.1)

## 2015-12-29 LAB — CBC
HCT: 27.6 % — ABNORMAL LOW (ref 35.0–47.0)
Hemoglobin: 8.7 g/dL — ABNORMAL LOW (ref 12.0–16.0)
MCH: 27 pg (ref 26.0–34.0)
MCHC: 31.7 g/dL — ABNORMAL LOW (ref 32.0–36.0)
MCV: 85.2 fL (ref 80.0–100.0)
Platelets: 153 10*3/uL (ref 150–440)
RBC: 3.24 MIL/uL — ABNORMAL LOW (ref 3.80–5.20)
RDW: 21 % — ABNORMAL HIGH (ref 11.5–14.5)
WBC: 5 10*3/uL (ref 3.6–11.0)

## 2015-12-29 LAB — PROTIME-INR
INR: 0.97
Prothrombin Time: 13.1 seconds (ref 11.4–15.0)

## 2015-12-29 LAB — GLUCOSE, CAPILLARY
Glucose-Capillary: 120 mg/dL — ABNORMAL HIGH (ref 65–99)
Glucose-Capillary: 26 mg/dL — CL (ref 65–99)
Glucose-Capillary: 125 mg/dL — ABNORMAL HIGH (ref 65–99)

## 2015-12-29 MED ORDER — OXYMETAZOLINE HCL 0.05 % NA SOLN
2.0000 | Freq: Two times a day (BID) | NASAL | Status: DC
Start: 2015-12-29 — End: 2016-04-07

## 2015-12-29 MED ORDER — HYDROCODONE-ACETAMINOPHEN 5-325 MG PO TABS
1.0000 | ORAL_TABLET | Freq: Once | ORAL | Status: AC
Start: 2015-12-29 — End: 2015-12-29
  Administered 2015-12-29: 1 via ORAL

## 2015-12-29 MED ORDER — DEXTROSE 50 % IV SOLN
50.0000 mL | Freq: Once | INTRAVENOUS | Status: AC
  Administered 2015-12-29: 50 mL via INTRAVENOUS

## 2015-12-29 MED ORDER — HYDROCODONE-ACETAMINOPHEN 5-325 MG PO TABS
ORAL_TABLET | ORAL | Status: AC
  Administered 2015-12-29: 1 via ORAL
  Filled 2015-12-29: qty 1

## 2015-12-29 MED ORDER — DEXTROSE 50 % IV SOLN
INTRAVENOUS | Status: AC
Start: 2015-12-29 — End: 2015-12-29
  Administered 2015-12-29: 50 mL via INTRAVENOUS
  Filled 2015-12-29: qty 50

## 2015-12-29 NOTE — ED Notes (Signed)
Report to christine, rn.  

## 2015-12-29 NOTE — ED Notes (Signed)
No nasal bleeding noted since pressure on nares relieved.

## 2015-12-29 NOTE — ED Notes (Signed)
Per Dr. Dahlia Client, okay for discharge.  [facility] notified, EMS called to transport pt.

## 2015-12-29 NOTE — ED Notes (Signed)
[facility] notified of pt's situation and that she will not be returning until blood glucose has stabilized.

## 2015-12-29 NOTE — Discharge Instructions (Signed)
° °  Nosebleed °Nosebleeds are common. They are due to a crack in the inside lining of your nose (mucous membrane) or from a small blood vessel that starts to bleed. Nosebleeds can be caused by many conditions, such as injury, infections, dry mucous membranes or dry climate, medicines, nose picking, and home heating and cooling systems. Most nosebleeds come from blood vessels in the front of your nose. °HOME CARE INSTRUCTIONS  °· Try controlling your nosebleed by pinching your nostrils gently and continuously for at least 10 minutes. °· Avoid blowing or sniffing your nose for a number of hours after having a nosebleed. °· Do not put gauze inside your nose yourself. If your nose was packed by your health care provider, try to maintain the pack inside of your nose until your health care provider removes it. °¨ If a gauze pack was used and it starts to fall out, gently replace it or cut off the end of it. °¨ If a balloon catheter was used to pack your nose, do not cut or remove it unless your health care provider has instructed you to do that. °· Avoid lying down while you are having a nosebleed. Sit up and lean forward. °· Use a nasal spray decongestant to help with a nosebleed as directed by your health care provider. °· Do not use petroleum jelly or mineral oil in your nose. These can drip into your lungs. °· Maintain humidity in your home by using less air conditioning or by using a humidifier. °· Aspirin and blood thinners make bleeding more likely. If you are prescribed these medicines and you suffer from nosebleeds, ask your health care provider if you should stop taking the medicines or adjust the dose. Do not stop medicines unless directed by your health care provider °· Resume your normal activities as you are able, but avoid straining, lifting, or bending at the waist for several days. °· If your nosebleed was caused by dry mucous membranes, use over-the-counter saline nasal spray or gel. This will keep the  mucous membranes moist and allow them to heal. If you must use a lubricant, choose the water-soluble variety. Use it only sparingly, and do not use it within several hours of lying down. °· Keep all follow-up visits as directed by your health care provider. This is important. °SEEK MEDICAL CARE IF: °· You have a fever. °· You get frequent nosebleeds. °· You are getting nosebleeds more often. °SEEK IMMEDIATE MEDICAL CARE IF: °· Your nosebleed lasts longer than 20 minutes. °· Your nosebleed occurs after an injury to your face, and your nose looks crooked or broken. °· You have unusual bleeding from other parts of your body. °· You have unusual bruising on other parts of your body. °· You feel light-headed or you faint. °· You become sweaty. °· You vomit blood. °· Your nosebleed occurs after a head injury. °  °This information is not intended to replace advice given to you by your health care provider. Make sure you discuss any questions you have with your health care provider. °  °Document Released: 06/18/2005 Document Revised: 09/29/2014 Document Reviewed: 04/24/2014 °Elsevier Interactive Patient Education ©2016 [employer] ° °

## 2015-12-29 NOTE — ED Notes (Signed)
Pink sleeve placed to left arm d/t dialysis catheter.

## 2015-12-29 NOTE — ED Provider Notes (Signed)
the patient was scheduled for discharge. After waiting for EMS to arrive the patient woke up and was complaining of some pain in her right back and asked if we could check her blood sugar. The patient continues to have no more epistaxis. We checked the patient's blood sugar was 26. At that point the patient was given some dextrose as well as given a [place] sandwich to eat. We monitored the patient for the next hour and her blood sugars were 120 and 125. She continues to have no more epistaxis. She will be discharged to home.  Loney Hering, MD 12/29/15 [phone]

## 2015-12-29 NOTE — ED Notes (Signed)
ENT cart at bedside per Dr. Dahlia Client request

## 2015-12-29 NOTE — ED Notes (Signed)
Discharge instructions reviewed with pt. Pt verbalized understanding.   

## 2015-12-29 NOTE — ED Notes (Signed)
Pt sleeping, no bleeding noted. Skin normal color warm and dry.

## 2015-12-29 NOTE — ED Notes (Signed)
Pt's son [name] called for update, phone number [phone]

## 2015-12-29 NOTE — ED Provider Notes (Signed)
[name]  I have reviewed the triage vital signs and the nursing notes.   HISTORY  Chief Complaint Epistaxis    HPI [name] is a 55 y.o. female comes into the hospital today with a nosebleed. The patient reports that since 2 PM she's been having this slow trickling nosebleed. It started suddenly. The patient is on Plavix. The patient reports she's never had this before. She does feel it going down the back of her throat. After it wouldn't stop the patient was sent in by her nursing facility.The patient denies any headache denies any pain denies any dizziness nausea or vomiting. She reports that she has not vomited up any blood.   Past Medical History  Diagnosis Date  . Asthma   . Collagen vascular disease ([place])   . Diabetes mellitus without complication ([place])   . Hypertension   . Coronary artery disease     a. cath 2013: stenting to RCA (report not available); b. cath 2014: LM nl, pLAD 40%, mLAD nl, ost LCx 40%, mid LCx nl, pRCA 30% @ site of prior stent, mRCA 50%  . Diabetic neuropathy ([place])   . ESRD (end stage renal disease) on dialysis ([place])     M-W-F  . GERD (gastroesophageal reflux disease)   . Hx of pancreatitis 2015  . Myocardial infarction ([place])   . Pneumonia   . Mitral regurgitation     a. echo 10/2013: EF 62%, noWMA, mildly dilated LA, mild to mod MR/TR, GR1DD  . COPD (chronic obstructive pulmonary disease) ([place])   . chronic diastolic CHF [id number]  . dialysis 2006  . Depression   . Anxiety   . Arthritis   . Headache   . Anemia   . Renal insufficiency     Patient Active Problem List   Diagnosis Date Noted  . Malnutrition of moderate degree 12/01/2015  . Renal mass   . Dyspnea   . Acute renal failure ([place])   . Respiratory failure ([place])   . High temperature 11/14/2015  . Pulmonary edema   . Encounter for  central line placement   . Encounter for orogastric (OG) tube placement   . Nausea 11/12/2015  . Hyperkalemia 10/03/2015  . C. difficile diarrhea 07/22/2015  . Pneumonia 05/21/2015  . Hypoglycemia 04/24/2015  . Unresponsiveness 04/24/2015  . Bradycardia 04/24/2015  . Hypothermia 04/24/2015  . Acute respiratory failure ([place]) 04/24/2015  . Acute diastolic CHF (congestive heart failure) ([place]) 04/05/2015  . Diabetic gastroparesis ([place]) 04/05/2015  . Hypokalemia 04/05/2015  . Generalized weakness 04/05/2015  . Acute pulmonary edema ([place]) 04/03/2015  . Nausea and vomiting 04/03/2015  . Hypoglycemia associated with diabetes ([place]) 04/03/2015  . Anemia of chronic disease 04/03/2015  . Secondary hyperparathyroidism ([place]) 04/03/2015  . Pressure ulcer 04/02/2015  . Acute respiratory failure with hypoxia ([place]) 04/01/2015  . Adjustment disorder with anxiety 03/14/2015  . Somatic symptom disorder, mild 03/08/2015  . Coronary artery disease involving native coronary artery of native heart without angina pectoris   . Nausea & vomiting 03/06/2015  . Abdominal pain 03/06/2015  . DM (diabetes mellitus) ([place]) 03/06/2015  . HTN (hypertension) 03/06/2015  . Gastroparesis 02/24/2015  . Pleural effusion 02/19/2015  . HCAP (healthcare-associated pneumonia) 02/19/2015  . End-stage renal disease on hemodialysis ([place]) 02/19/2015    Past Surgical History  Procedure Laterality Date  . Cholecystectomy    . Appendectomy    . Abdominal hysterectomy    .  Dialysis fistula creation Left     upper arm  . Esophagogastroduodenoscopy N/A 03/08/2015    Procedure: ESOPHAGOGASTRODUODENOSCOPY (EGD);  Surgeon: Manya Silvas, MD;  Location: [facility] ENDOSCOPY;  Service: Endoscopy;  Laterality: N/A;  . Cardiac catheterization Left 07/26/2015    Procedure: Left Heart Cath and Coronary Angiography;  Surgeon: Dionisio David, MD;  Location: [place] CV LAB;  Service: Cardiovascular;  Laterality: Left;  Marland Kitchen Eye surgery     . Fecal transplant N/A 08/23/2015    Procedure: FECAL TRANSPLANT;  Surgeon: Manya Silvas, MD;  Location: [facility] ENDOSCOPY;  Service: Endoscopy;  Laterality: N/A;  . Peripheral vascular catheterization N/A 12/20/2015    Procedure: Thrombectomy of dialysis access versus permcath placement;  Surgeon: Algernon Huxley, MD;  Location: [place] CV LAB;  Service: Cardiovascular;  Laterality: N/A;  . Peripheral vascular catheterization N/A 12/20/2015    Procedure: A/V Shunt Intervention;  Surgeon: Algernon Huxley, MD;  Location: [place] CV LAB;  Service: Cardiovascular;  Laterality: N/A;  . Peripheral vascular catheterization N/A 12/20/2015    Procedure: A/V Shuntogram/Fistulagram;  Surgeon: Algernon Huxley, MD;  Location: [place] CV LAB;  Service: Cardiovascular;  Laterality: N/A;    Current Outpatient Rx  Name  Route  Sig  Dispense  Refill  . albuterol (PROVENTIL) (2.5 MG/3ML) 0.083% nebulizer solution   Nebulization   Take 2.5 mg by nebulization every 4 (four) hours as needed for wheezing or shortness of breath.         Marland Kitchen amLODipine (NORVASC) 10 MG tablet   Oral   Take 10 mg by mouth daily.         Marland Kitchen aspirin EC 81 MG tablet   Oral   Take 81 mg by mouth daily.         Marland Kitchen atorvastatin (LIPITOR) 20 MG tablet   Oral   Take 20 mg by mouth every evening.         . B Complex-C-Biotin-E-FA (RENATABS) 1 MG TABS   Oral   Take 1 mg by mouth daily. Reported on 12/28/2015         . B COMPLEX-C-CALCIUM PO   Oral   Take 1 tablet by mouth daily.         . bisacodyl (DULCOLAX) 5 MG EC tablet   Oral   Take 1 tablet (5 mg total) by mouth daily as needed for moderate constipation.   30 tablet   0   . carbamide peroxide (DEBROX) 6.5 % otic solution   Both Ears   Place 5 drops into both ears 2 (two) times daily.         . carvedilol (COREG) 3.125 MG tablet   Oral   Take 3.125 mg by mouth 2 (two) times daily with a meal.         . cetirizine (ZYRTEC) 10 MG tablet   Oral    Take 10 mg by mouth daily.         . cholecalciferol (VITAMIN D) 1000 units tablet   Oral   Take 1,000 Units by mouth daily.         . cholecalciferol (VITAMIN D) 400 units TABS tablet   Oral   Take 400 Units by mouth daily.         . citalopram (CELEXA) 20 MG tablet   Oral   Take 20 mg by mouth daily.         . clopidogrel (PLAVIX) 75 MG tablet   Oral  Take 75 mg by mouth daily. Reported on 12/17/2015         . glucose 4 GM chewable tablet   Oral   Chew 3-4 tablets by mouth as needed for low blood sugar (if pts blood sugar is less than 60).         . hydrALAZINE (APRESOLINE) 25 MG tablet   Oral   Take 1 tablet (25 mg total) by mouth every 8 (eight) hours.         Marland Kitchen HYDROcodone-acetaminophen (NORCO/VICODIN) 5-325 MG tablet   Oral   Take 1-2 tablets by mouth every 4 (four) hours as needed for moderate pain or severe pain.         . hydrocortisone (ANUSOL-HC) 25 MG suppository   Rectal   Place 25 mg rectally 2 (two) times daily.         . insulin detemir (LEVEMIR) 100 UNIT/ML injection   Subcutaneous   Inject 6 Units into the skin at bedtime.         Marland Kitchen lactulose (CHRONULAC) 10 GM/15ML solution   Oral   Take 30 g by mouth See admin instructions. To be given when patient returns from dialysis on Sun, Tues, Thurs, and Sat         . lipase/protease/amylase (CREON) 12000 UNITS CPEP capsule   Oral   Take 12,000 Units by mouth 3 (three) times daily with meals.          . meclizine (ANTIVERT) 25 MG tablet   Oral   Take 25 mg by mouth 2 (two) times daily.         . metoCLOPramide (REGLAN) 10 MG tablet   Oral   Take 1 tablet (10 mg total) by mouth 3 (three) times daily before meals.   90 tablet   0   . nitroGLYCERIN (NITROSTAT) 0.4 MG SL tablet   Sublingual   Place 0.4 mg under the tongue every 5 (five) minutes x 3 doses as needed for chest pain. *Max 3 doses per episode*         . omega-3 acid ethyl esters (LOVAZA) 1 g capsule   Oral    Take 1 g by mouth daily.         Marland Kitchen oxymetazoline (AFRIN) 0.05 % nasal spray   Each Nare   Place 6 sprays into both nostrils 2 (two) times daily.         . polyethylene glycol (MIRALAX / GLYCOLAX) packet   Oral   Take 17 g by mouth daily as needed for mild constipation.          . promethazine (PHENERGAN) 12.5 MG tablet   Oral   Take 12.5 mg by mouth every 8 (eight) hours as needed for nausea or vomiting.         . ranitidine (ZANTAC) 150 MG tablet   Oral   Take 150 mg by mouth 2 (two) times daily as needed for heartburn.         . sevelamer carbonate (RENVELA) 800 MG tablet   Oral   Take 800 mg by mouth 3 (three) times daily with meals.          Marland Kitchen oxymetazoline (AFRIN) 0.05 % nasal spray   Each Nare   Place 2 sprays into both nostrils 2 (two) times daily.   15 mL   0     Allergies Compazine; Ace inhibitors; Ativan; Codeine; Gabapentin; Losartan; Ondansetron; Prochlorperazine; Scopolamine; Zofran; Oxycodone; and Tape  Family History  Problem Relation  Age of Onset  . Kidney disease Mother   . Diabetes Mother     Social History Social History  Substance Use Topics  . Smoking status: Former Smoker -- 0.50 packs/day    Types: Cigarettes    Quit date: 02/13/2015  . Smokeless tobacco: Never Used  . Alcohol Use: No    Review of Systems Constitutional: No fever/chills Eyes: No visual changes. ENT: Nose bleed Cardiovascular: Denies chest pain. Respiratory: Denies shortness of breath. Gastrointestinal: No abdominal pain.  No nausea, no vomiting.  No diarrhea.  No constipation. Genitourinary: Negative for dysuria. Musculoskeletal: Negative for back pain. Skin: Negative for rash. Neurological: Negative for headaches, focal weakness or numbness.  10-point ROS otherwise negative.  ____________________________________________   PHYSICAL EXAM:  VITAL SIGNS: ED Triage Vitals  Enc Vitals Group     BP 12/28/15 2215 164/90 mmHg     Pulse Rate 12/28/15  2215 69     Resp 12/28/15 2215 14     Temp 12/28/15 2215 98.2 F (36.8 C)     Temp Source 12/28/15 2215 Oral     SpO2 12/28/15 2215 96 %     Weight 12/28/15 2215 143 lb (64.864 kg)     Height 12/28/15 2215 5\' 5"  (1.651 m)     Head Cir --      Peak Flow --      Pain Score 12/29/15 0016 Asleep     Pain Loc --      Pain Edu? --      Excl. in [place]? --     Constitutional: Alert and oriented. Well appearing and in no acute distress. Eyes: Conjunctivae are normal. PERRL. EOMI. Head: Atraumatic. Nose: Left nostril epistaxis Mouth/Throat: Mucous membranes are moist.  Blood noticed down left tonsillar pillar Cardiovascular: Normal rate, regular rhythm.  Respiratory: Normal respiratory effort.  No retractions. Lungs CTAB. Gastrointestinal: Soft and nontender.   Musculoskeletal: No lower extremity tenderness nor edema.   Neurologic:  Normal speech and language.  Skin:  Skin is warm, dry and intact. Psychiatric: Mood and affect are normal.   ____________________________________________   LABS (all labs ordered are listed, but only abnormal results are displayed)  Labs Reviewed  CBC - Abnormal; Notable for the following:    RBC 3.24 (*)    Hemoglobin 8.7 (*)    HCT 27.6 (*)    MCHC 31.7 (*)    RDW 21.0 (*)    All other components within normal limits  COMPREHENSIVE METABOLIC PANEL - Abnormal; Notable for the following:    Sodium 133 (*)    Potassium 3.3 (*)    Chloride 96 (*)    Glucose, Bld 222 (*)    Creatinine, Ser 2.48 (*)    Calcium 7.9 (*)    Albumin 2.6 (*)    GFR calc non Af Amer 21 (*)    GFR calc Af Amer 24 (*)    All other components within normal limits  PROTIME-INR   ____________________________________________  EKG  None ____________________________________________  RADIOLOGY  None ____________________________________________   PROCEDURES  Procedure(s) performed: None  Critical Care performed:  No  ____________________________________________   INITIAL IMPRESSION / ASSESSMENT AND PLAN / ED COURSE  Pertinent labs & imaging results that were available during my care of the patient were reviewed by me and considered in my medical decision making (see chart for details).  This is a 55 year old female who comes into the hospital today with some nosebleed. We did pinch the patient's nose and placed some Afrin  to her left nausea. I will reassess the patient to determine if she needs any packing.  After holding pressure and using the Afrin the patient was monitored for multiple hours. She did not have any recurrence of her nosebleed. The patient will be discharged back to her nursing home to follow-up with ENT. The patient is sleeping without any difficulty. ____________________________________________   FINAL CLINICAL IMPRESSION(S) / ED DIAGNOSES  Final diagnoses:  Epistaxis      Loney Hering, MD 12/29/15 0430

## 2015-12-29 NOTE — ED Notes (Signed)
Pressure removed from nare, no fresh bleeding noted.

## 2015-12-29 NOTE — ED Notes (Signed)
Pt c/o feeling hot and not right, CBG checked 26, Pt given orange juice, line started, d50 given under verbal order from dr. Karma Greaser, dr. Dahlia Client notified.  PT given meal tray and more juice.  Currently dr Dahlia Client at bedside.

## 2016-01-02 ENCOUNTER — Encounter
Admission: RE | Disposition: A | Discharge status: Skilled Nursing Facility | Payer: Self-pay | Source: Ambulatory Visit | Attending: Vascular Surgery

## 2016-01-02 ENCOUNTER — Other Ambulatory Visit: Payer: Self-pay | Admitting: Vascular Surgery

## 2016-01-02 ENCOUNTER — Ambulatory Visit
Admission: RE | Admit: 2016-01-02 | Discharge: 2016-01-02 | Disposition: A | Discharge status: Skilled Nursing Facility | Payer: Medicare Other | Source: Ambulatory Visit | Attending: Vascular Surgery | Admitting: Vascular Surgery

## 2016-01-02 DIAGNOSIS — Z888 Allergy status to other drugs, medicaments and biological substances status: Secondary | ICD-10-CM | POA: Insufficient documentation

## 2016-01-02 DIAGNOSIS — E1140 Type 2 diabetes mellitus with diabetic neuropathy, unspecified: Secondary | ICD-10-CM | POA: Insufficient documentation

## 2016-01-02 DIAGNOSIS — Z7984 Long term (current) use of oral hypoglycemic drugs: Secondary | ICD-10-CM | POA: Diagnosis not present

## 2016-01-02 DIAGNOSIS — Z7951 Long term (current) use of inhaled steroids: Secondary | ICD-10-CM | POA: Insufficient documentation

## 2016-01-02 DIAGNOSIS — Z9049 Acquired absence of other specified parts of digestive tract: Secondary | ICD-10-CM | POA: Insufficient documentation

## 2016-01-02 DIAGNOSIS — J449 Chronic obstructive pulmonary disease, unspecified: Secondary | ICD-10-CM | POA: Diagnosis not present

## 2016-01-02 DIAGNOSIS — E1122 Type 2 diabetes mellitus with diabetic chronic kidney disease: Secondary | ICD-10-CM | POA: Diagnosis not present

## 2016-01-02 DIAGNOSIS — M1990 Unspecified osteoarthritis, unspecified site: Secondary | ICD-10-CM | POA: Diagnosis not present

## 2016-01-02 DIAGNOSIS — Z87891 Personal history of nicotine dependence: Secondary | ICD-10-CM | POA: Insufficient documentation

## 2016-01-02 DIAGNOSIS — R51 Headache: Secondary | ICD-10-CM | POA: Insufficient documentation

## 2016-01-02 DIAGNOSIS — Z79899 Other long term (current) drug therapy: Secondary | ICD-10-CM | POA: Diagnosis not present

## 2016-01-02 DIAGNOSIS — I340 Nonrheumatic mitral (valve) insufficiency: Secondary | ICD-10-CM | POA: Insufficient documentation

## 2016-01-02 DIAGNOSIS — Z992 Dependence on renal dialysis: Secondary | ICD-10-CM | POA: Diagnosis not present

## 2016-01-02 DIAGNOSIS — Z9889 Other specified postprocedural states: Secondary | ICD-10-CM | POA: Diagnosis not present

## 2016-01-02 DIAGNOSIS — I2510 Atherosclerotic heart disease of native coronary artery without angina pectoris: Secondary | ICD-10-CM | POA: Diagnosis not present

## 2016-01-02 DIAGNOSIS — Z885 Allergy status to narcotic agent status: Secondary | ICD-10-CM | POA: Insufficient documentation

## 2016-01-02 DIAGNOSIS — Z794 Long term (current) use of insulin: Secondary | ICD-10-CM | POA: Diagnosis not present

## 2016-01-02 DIAGNOSIS — Z8701 Personal history of pneumonia (recurrent): Secondary | ICD-10-CM | POA: Insufficient documentation

## 2016-01-02 DIAGNOSIS — N186 End stage renal disease: Secondary | ICD-10-CM | POA: Insufficient documentation

## 2016-01-02 DIAGNOSIS — Z841 Family history of disorders of kidney and ureter: Secondary | ICD-10-CM | POA: Diagnosis not present

## 2016-01-02 DIAGNOSIS — Z90710 Acquired absence of both cervix and uterus: Secondary | ICD-10-CM | POA: Diagnosis not present

## 2016-01-02 DIAGNOSIS — Y832 Surgical operation with anastomosis, bypass or graft as the cause of abnormal reaction of the patient, or of later complication, without mention of misadventure at the time of the procedure: Secondary | ICD-10-CM | POA: Insufficient documentation

## 2016-01-02 DIAGNOSIS — Z7982 Long term (current) use of aspirin: Secondary | ICD-10-CM | POA: Insufficient documentation

## 2016-01-02 DIAGNOSIS — I5032 Chronic diastolic (congestive) heart failure: Secondary | ICD-10-CM | POA: Diagnosis not present

## 2016-01-02 DIAGNOSIS — K219 Gastro-esophageal reflux disease without esophagitis: Secondary | ICD-10-CM | POA: Diagnosis not present

## 2016-01-02 DIAGNOSIS — I132 Hypertensive heart and chronic kidney disease with heart failure and with stage 5 chronic kidney disease, or end stage renal disease: Secondary | ICD-10-CM | POA: Insufficient documentation

## 2016-01-02 DIAGNOSIS — D649 Anemia, unspecified: Secondary | ICD-10-CM | POA: Diagnosis not present

## 2016-01-02 DIAGNOSIS — T82868A Thrombosis of vascular prosthetic devices, implants and grafts, initial encounter: Secondary | ICD-10-CM | POA: Insufficient documentation

## 2016-01-02 DIAGNOSIS — Z833 Family history of diabetes mellitus: Secondary | ICD-10-CM | POA: Insufficient documentation

## 2016-01-02 DIAGNOSIS — I252 Old myocardial infarction: Secondary | ICD-10-CM | POA: Diagnosis not present

## 2016-01-02 DIAGNOSIS — Z9109 Other allergy status, other than to drugs and biological substances: Secondary | ICD-10-CM | POA: Diagnosis not present

## 2016-01-02 HISTORY — PX: PERIPHERAL VASCULAR CATHETERIZATION: SHX172C

## 2016-01-02 LAB — POTASSIUM (ARMC VASCULAR LAB ONLY): Potassium (ARMC vascular lab): 4.9 (ref 3.5–5.1)

## 2016-01-02 SURGERY — A/V SHUNTOGRAM/FISTULAGRAM
Site: Arm Upper | Wound class: Clean

## 2016-01-02 MED ORDER — IOPAMIDOL (ISOVUE-300) INJECTION 61%
INTRAVENOUS | Status: DC | PRN
  Administered 2016-01-02: 35 mL via INTRAVENOUS

## 2016-01-02 MED ORDER — ACETAMINOPHEN 325 MG PO TABS: 325.0000 mg | ORAL_TABLET | ORAL | Status: DC | PRN

## 2016-01-02 MED ORDER — METHYLPREDNISOLONE SODIUM SUCC 125 MG IJ SOLR: 125.0000 mg | INTRAMUSCULAR | Status: DC | PRN

## 2016-01-02 MED ORDER — CEFUROXIME SODIUM 1.5 G IJ SOLR
INTRAMUSCULAR | Status: AC
  Filled 2016-01-02: qty 1.5

## 2016-01-02 MED ORDER — SODIUM CHLORIDE 0.9 % IV SOLN
INTRAVENOUS | Status: DC
  Administered 2016-01-02: 10:00:00 via INTRAVENOUS

## 2016-01-02 MED ORDER — HEPARIN (PORCINE) IN NACL 2-0.9 UNIT/ML-% IJ SOLN
INTRAMUSCULAR | Status: AC
  Filled 2016-01-02: qty 1000

## 2016-01-02 MED ORDER — HEPARIN SODIUM (PORCINE) 1000 UNIT/ML IJ SOLN
INTRAMUSCULAR | Status: AC
  Filled 2016-01-02: qty 1

## 2016-01-02 MED ORDER — HYDROMORPHONE HCL 1 MG/ML IJ SOLN: 1.0000 mg | Freq: Once | INTRAMUSCULAR | Status: DC

## 2016-01-02 MED ORDER — MIDAZOLAM HCL 2 MG/2ML IJ SOLN
INTRAMUSCULAR | Status: AC
  Filled 2016-01-02: qty 2

## 2016-01-02 MED ORDER — METOPROLOL TARTRATE 1 MG/ML IV SOLN: 2.0000 mg | INTRAVENOUS | Status: DC | PRN

## 2016-01-02 MED ORDER — LIDOCAINE-EPINEPHRINE (PF) 1 %-1:200000 IJ SOLN
INTRAMUSCULAR | Status: AC
  Filled 2016-01-02: qty 30

## 2016-01-02 MED ORDER — MIDAZOLAM HCL 2 MG/2ML IJ SOLN
INTRAMUSCULAR | Status: DC | PRN
  Administered 2016-01-02 (×2): 2 mg via INTRAVENOUS

## 2016-01-02 MED ORDER — DEXTROSE 5 % IV SOLN: 1.5000 g | INTRAVENOUS | Status: DC

## 2016-01-02 MED ORDER — SODIUM CHLORIDE 0.9 % IV SOLN: 500.0000 mL | Freq: Once | INTRAVENOUS | Status: DC | PRN

## 2016-01-02 MED ORDER — PHENOL 1.4 % MT LIQD: 1.0000 | OROMUCOSAL | Status: DC | PRN

## 2016-01-02 MED ORDER — ALTEPLASE 2 MG IJ SOLR
INTRAMUSCULAR | Status: AC
  Filled 2016-01-02: qty 4

## 2016-01-02 MED ORDER — GUAIFENESIN-DM 100-10 MG/5ML PO SYRP
15.0000 mL | ORAL_SOLUTION | ORAL | Status: DC | PRN
  Filled 2016-01-02: qty 15

## 2016-01-02 MED ORDER — FENTANYL CITRATE (PF) 100 MCG/2ML IJ SOLN
INTRAMUSCULAR | Status: AC
  Filled 2016-01-02: qty 2

## 2016-01-02 MED ORDER — HEPARIN SODIUM (PORCINE) 1000 UNIT/ML IJ SOLN
INTRAMUSCULAR | Status: DC | PRN
  Administered 2016-01-02: 4000 [IU] via INTRAVENOUS

## 2016-01-02 MED ORDER — ACETAMINOPHEN 325 MG RE SUPP
325.0000 mg | RECTAL | Status: DC | PRN
  Filled 2016-01-02: qty 2

## 2016-01-02 MED ORDER — FAMOTIDINE 20 MG PO TABS: 40.0000 mg | ORAL_TABLET | ORAL | Status: DC | PRN

## 2016-01-02 MED ORDER — FENTANYL CITRATE (PF) 100 MCG/2ML IJ SOLN
INTRAMUSCULAR | Status: DC | PRN
  Administered 2016-01-02 (×2): 50 ug via INTRAVENOUS

## 2016-01-02 SURGICAL SUPPLY — 17 items
BALLN DORADO 7X150X80 (BALLOONS) ×3
BALLN DORADO 9X40X80 (BALLOONS) ×3
BALLOON DORADO 7X150X80 (BALLOONS) ×2 IMPLANT
BALLOON DORADO 9X40X80 (BALLOONS) ×2 IMPLANT
CATH KA2 5FR 65CM (CATHETERS) ×3 IMPLANT
DEVICE PRESTO INFLATION (MISCELLANEOUS) ×3 IMPLANT
DRAPE BRACHIAL (DRAPES) ×3 IMPLANT
PACK ANGIOGRAPHY (CUSTOM PROCEDURE TRAY) ×3 IMPLANT
SET AVX THROMB ULT (MISCELLANEOUS) ×3 IMPLANT
SHEATH BRITE TIP 6FRX5.5 (SHEATH) ×6 IMPLANT
SHEATH BRITE TIP 7FRX5.5 (SHEATH) ×3 IMPLANT
STENT VIABAHN 8X150X120 (Permanent Stent) ×1 IMPLANT
STENT VIABAHN 8X15X120 7FR (Permanent Stent) ×2 IMPLANT
SUT MNCRL AB 4-0 PS2 18 (SUTURE) ×3 IMPLANT
TOWEL OR 17X26 4PK STRL BLUE (TOWEL DISPOSABLE) ×3 IMPLANT
WIRE G V18X300CM (WIRE) ×3 IMPLANT
WIRE MAGIC TOR.035 180C (WIRE) ×6 IMPLANT

## 2016-01-02 NOTE — Op Note (Signed)
[facility]    OPERATIVE NOTE   PROCEDURE: 1.  Left brachial artery to axillary vein arteriovenous graft cannulation under ultrasound guidance in an antegrade fashion 2.  Left arm shuntogram and central venogram 3.  Catheter directed thrombolysis with 4 mg of TPA delivered with the AngioJet AVX catheter 4.  Mechanical rheolytic thrombectomy to the left brachial artery to axillary vein AV graft, axillary vein, subclavian vein, and left innominate vein with the AngioJet AVX catheter 5.  Percutaneous transluminal angioplasty of the graft with 7 mm diameter high pressure angioplasty balloon 6.  Percutaneous transluminal angioplasty of the left subclavian and innominate veins with 7 mm diameter and 9 mm diameter angioplasty balloon 7.  Viabahn covered stent placement to the graft for pseudoaneurysm and abnormal venous connection from previous procedures with 58m diameter by 15 cm length stent  PRE-OPERATIVE DIAGNOSIS: 1. ESRD 2.  Thrombosed left brachial artery to axillary vein arteriovenous graft  POST-OPERATIVE DIAGNOSIS: same as above   SURGEON: JLeotis Pain MD  ANESTHESIA: local with Moderate Conscious Sedation for approximately 35 minutes using 4 mg of Versed and 100 mcg of Fentanyl  ESTIMATED BLOOD LOSS: 50 cc  FINDING(S): 1. Thrombosed graft. Thrombus within the central veins at the end of previously placed stents. 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  The patient is scheduled for an attempted declot and shuntogram.  The patient is aware the risks include but are not limited to:  bleeding, infection, thrombosis of the cannulated access, and possible anaphylactic reaction to the contrast.  The patient is aware of the risks of the procedure and elects to proceed forward.  DESCRIPTION: After full informed written consent was obtained, the patient was brought back to the angiography suite and placed supine upon the angiography table.  The patient was connected to monitoring equipment. Moderate conscious sedation was administered with a face to face encounter with the patient throughout the procedure with my supervision of the RN administering medicines and monitoring the patient's vital signs, pulse oximetry, telemetry and mental status throughout from the start of the procedure until the patient was taken to the recovery room. The left arm was prepped and draped in the standard fashion for a percutaneous access intervention.  Under ultrasound guidance the first several centimeters of the graft were clearly open, with thrombosis of the midportion of the graft. With this information, the initial portion of the arteriovenous graft near the brachial artery was cannulated with a micropuncture needle under direct ultrasound guidance due to the pulseless nature of the graft in an antegrade fashion, and permanent images were performed.  The microwire was advanced and the needle was exchanged for the a microsheath.  I then upsized to a 6 Fr Sheath and later a 7 French sheath and imaging was performed.  Hand injections were completed to image the access including the central venous system. This demonstrated no flow within the AV graft and the anomalous connection to a branch of the cephalic vein near the access site for a previous declot procedure close to the arterial access sites.  Based on the images, this patient will need  extensive treatment to salvage the graft. I then gave the patient 4000 units of intravenous heparin.  I then placed a Magic torque wire into the left innominate vein from the  antegrade sheath. 4 mg of TPA were deployed throughout the almost the entirety of the graft and into the axillary, subclavian, and innominate veins. This was allowed to dwell for 10-15 minutes. Mechanical rheolytic thrombectomy was then performed throughout the graft and into the axillary, subclavian, and innominate vein. This uncovered a pseudoaneurysm with bleeding near the venous access site, persistent flow into the venous system from the anomalous connection to the cephalic vein branch, and an area of narrowing and thrombus at the end of the stent in the left subclavian vein as well as some residual thrombus within the graft near the arterial access site. I then turned my attention to the thrombus in the subclavian and innominate vein. Mechanical rheolytic thrombectomy was performed to both the graft and the central venous portion. This resulted in improvement but not resolution.  I then elected to treat this with a 7 mm diameter by 15 cm length high pressure angioplasty balloon used initially in both the graft and the central veins. It was inflated to about 18-20 atm in both locations and held for 1 minute. This was slightly undersized for the central venous portion, and I upsized to a 9 mm diameter high pressure angioplasty balloon inflated to 16 atm for 1 minute and the left innominate and subclavian veins. Minimal residual irregularity was seen in the central veins after the second larger inflation and mechanical thrombectomy. I then turned my attention to the graft. To treat the residual pseudoaneurysm at the venous access site as well as the anomalous venous connection near the arterial access site, I exchanged for a 0.018 wire and selected an 8 mm diameter by 15 cm length Viabahn covered stent. This was deployed encompassing both lesions in the graft from about 4-5 cm from the arterial anastomosis to the mid to distal graft. This was postdilated with a 7 mm balloon with excellent angiographic completion  result. There is no significant residual stenosis identified, no further pseudoaneurysm identified, and the anomalous venous connection was also obliterated.   Based on the completion imaging, no further intervention is necessary.  The wire and balloon were removed from the sheath.  A 4-0 Monocryl purse-string suture was sewn around the sheath.  The sheath was removed while tying down the suture.  A sterile bandage was applied to the puncture site.  COMPLICATIONS: None  CONDITION: Stable   Latajah Thuman 01/02/2016 1:33 PM

## 2016-01-02 NOTE — Discharge Instructions (Signed)
Fistulogram, Care After Refer to this sheet in the next few weeks. These instructions provide you with information on caring for yourself after your procedure. Your health care provider may also give you more specific instructions. Your treatment has been planned according to current medical practices, but problems sometimes occur. Call your health care provider if you have any problems or questions after your procedure. WHAT TO EXPECT AFTER THE PROCEDURE After your procedure, it is typical to have the following:  A small amount of discomfort in the area where the catheters were placed.  A small amount of bruising around the fistula.  Sleepiness and fatigue. HOME CARE INSTRUCTIONS  Rest at home for the day following your procedure.  Do not drive or operate heavy machinery while taking pain medicine.  Take medicines only as directed by your health care provider.  Do not take baths, swim, or use a hot tub until your health care provider approves. You may shower 24 hours after the procedure or as directed by your health care provider.  There are many different ways to close and cover an incision, including stitches, skin glue, and adhesive strips. Follow your health care provider's instructions on:  Incision care.  Bandage (dressing) changes and removal.  Incision closure removal.  Monitor your dialysis fistula carefully. SEEK MEDICAL CARE IF:  You have drainage, redness, swelling, or pain at your catheter site.  You have a fever.  You have chills. SEEK IMMEDIATE MEDICAL CARE IF:  You feel weak.  You have trouble balancing.  You have trouble moving your arms or legs.  You have problems with your speech or vision.  You can no longer feel a vibration or buzz when you put your fingers over your dialysis fistula.  The limb that was used for the procedure:  Swells.  Is painful.  Is cold.  Is discolored, such as blue or pale white.   This information is not intended  to replace advice given to you by your health care provider. Make sure you discuss any questions you have with your health care provider.   Document Released: 01/23/2014 Document Reviewed: 01/23/2014 Elsevier Interactive Patient Education 2016 [place].      Dialysis Scheduled at [facility] on Thursday, January 03, 2016 at 7:40am

## 2016-01-02 NOTE — H&P (Signed)
  [facility] History & Physical Update  The patient was interviewed and re-examined.  The patient's previous History and Physical has been reviewed and her graft is clotted again.  We are asked to try to salvage this and open the access.  There is no change in the plan of care. We plan to proceed with the scheduled procedure.  DEW,JASON, MD  01/02/2016, 12:40 PM

## 2016-01-02 NOTE — Progress Notes (Signed)
Set an appointment up at [facility] for patient to have dialysis 4/13 at 7:40am, added to discharge instructions. Called report to [employer], spoke to [place] to update on patient's status and notify of upcoming appointments as well as dialysis planned for tomorrow.

## 2016-01-03 ENCOUNTER — Encounter: Payer: Self-pay | Admitting: Vascular Surgery

## 2016-01-06 IMAGING — CR DG CHEST 1V PORT
1 series · 1 of 1 positions shown · non-contrast
Comparison: 07/23/2015

CLINICAL DATA: Shortness of breath. End-stage renal disease on
hemodialysis. Fever. Healthcare associated pneumonia.

EXAM:
PORTABLE CHEST 1 VIEW

[portable]
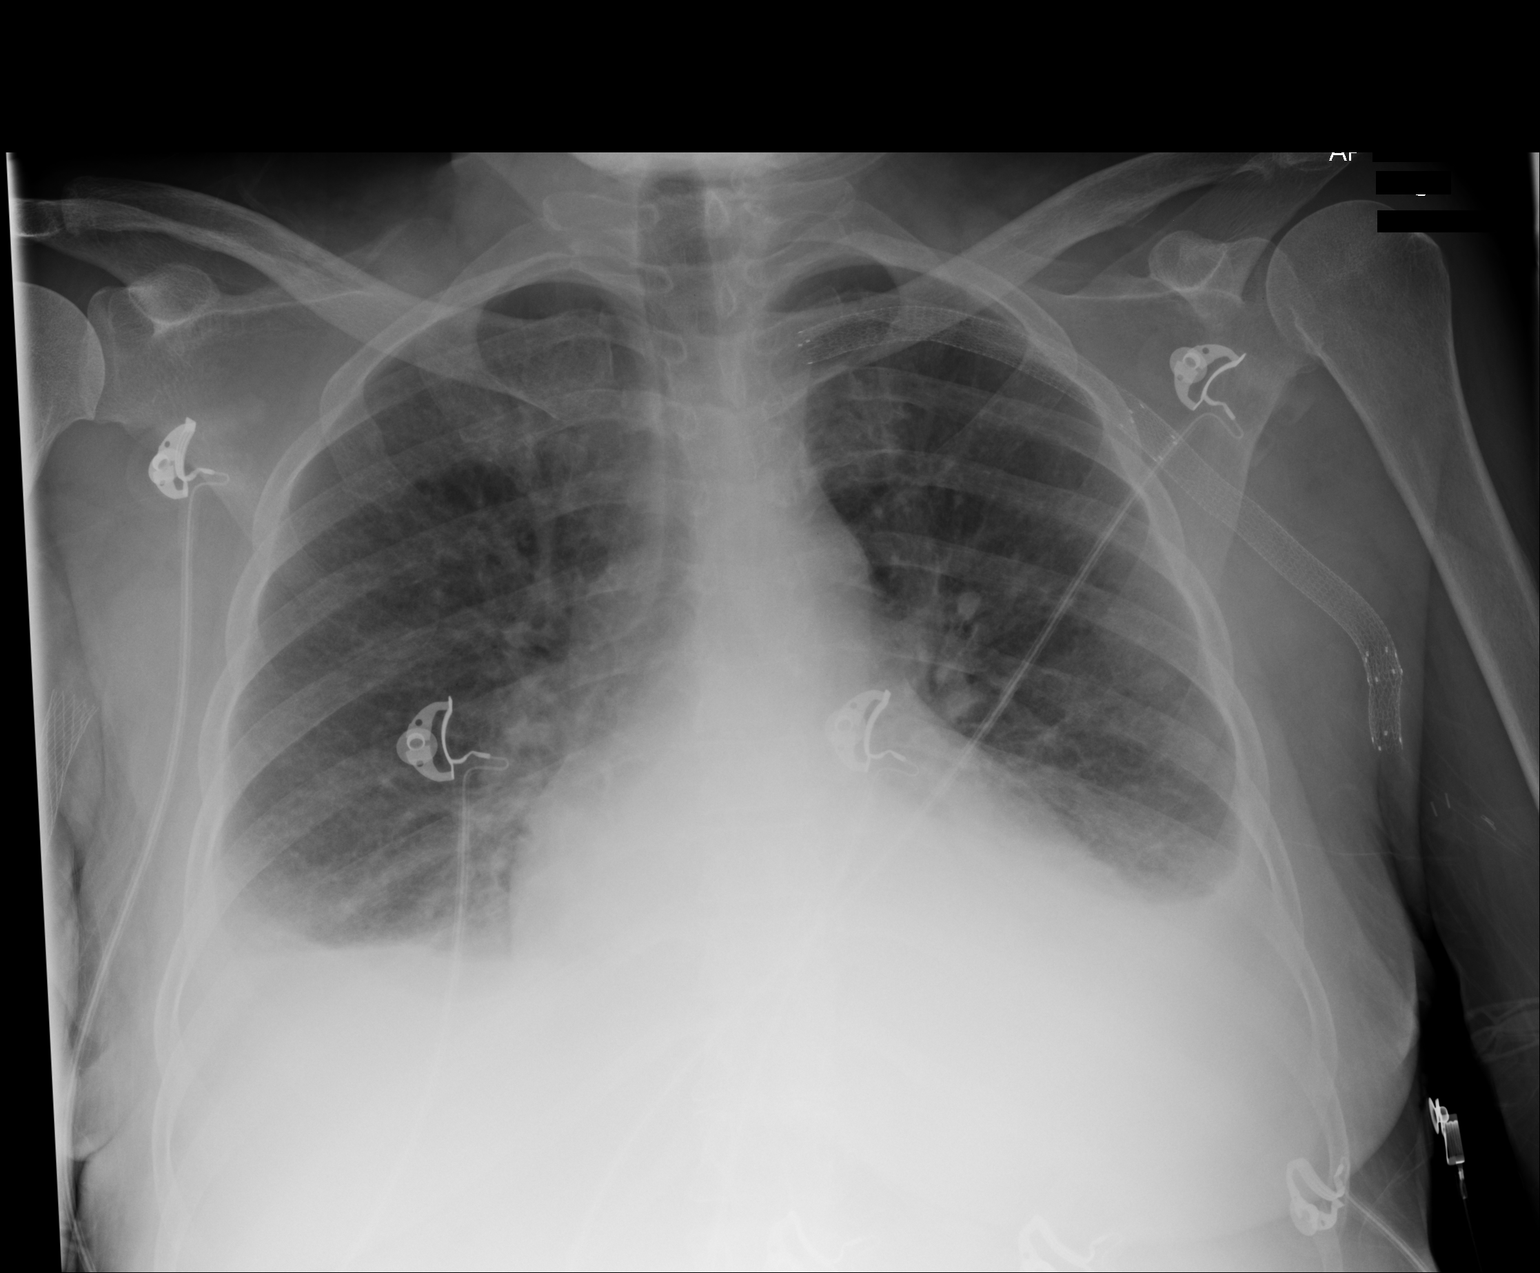

[1 of 1 positions shown; findings below may reference images not displayed]

FINDINGS: Mild cardiomegaly stable. Diffuse interstitial prominence is
suspicious for mild interstitial edema but shows mild improvement
since previous study. Small bilateral pleural effusions and
bibasilar atelectasis also show mild improvement.
IMPRESSION: Mild improvement in diffuse interstitial edema and bilateral pleural
effusions.

## 2016-01-07 IMAGING — CR DG CHEST 2V
2 series · 2 of 2 positions shown · non-contrast
Comparison: Radiograph 08/31/2015

CLINICAL DATA: Increasing short of breath and cough

EXAM:
CHEST  2 VIEW

[chest lat]
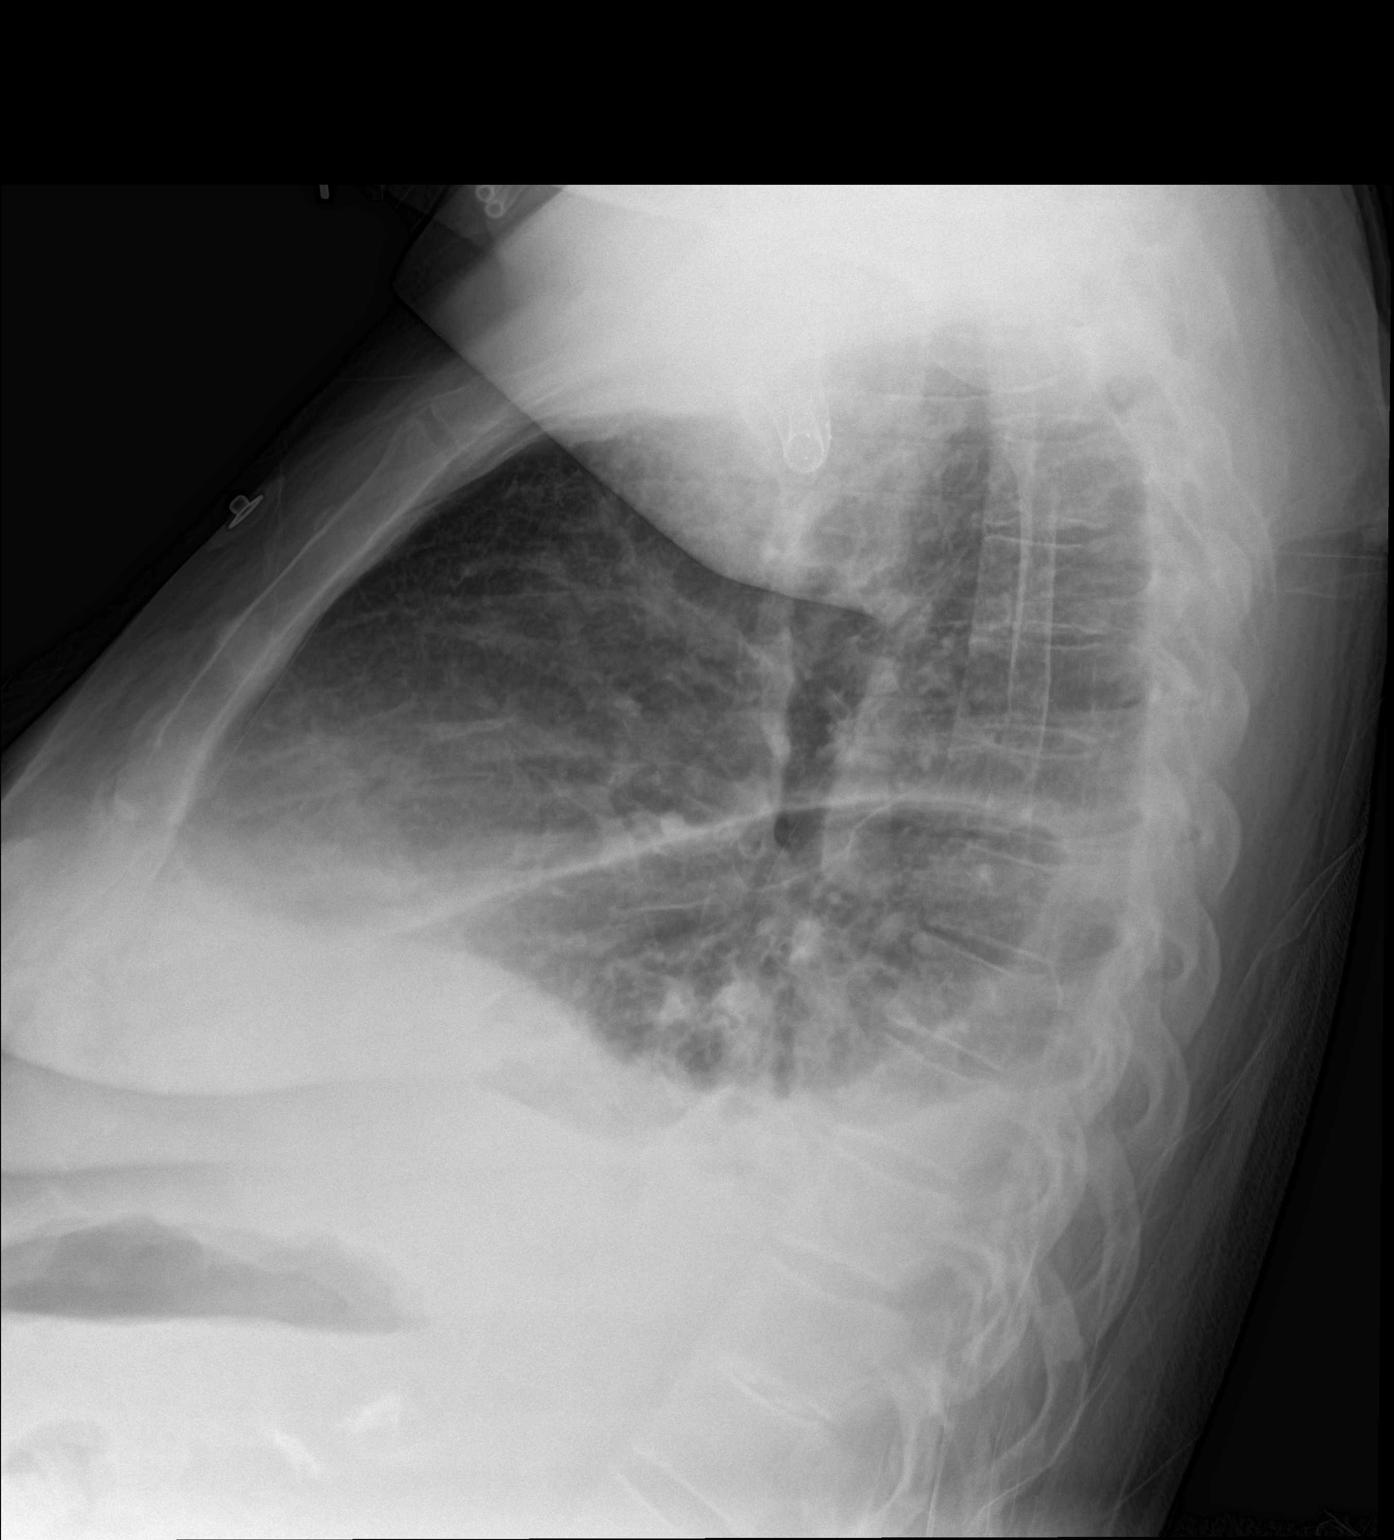

[chest ap]
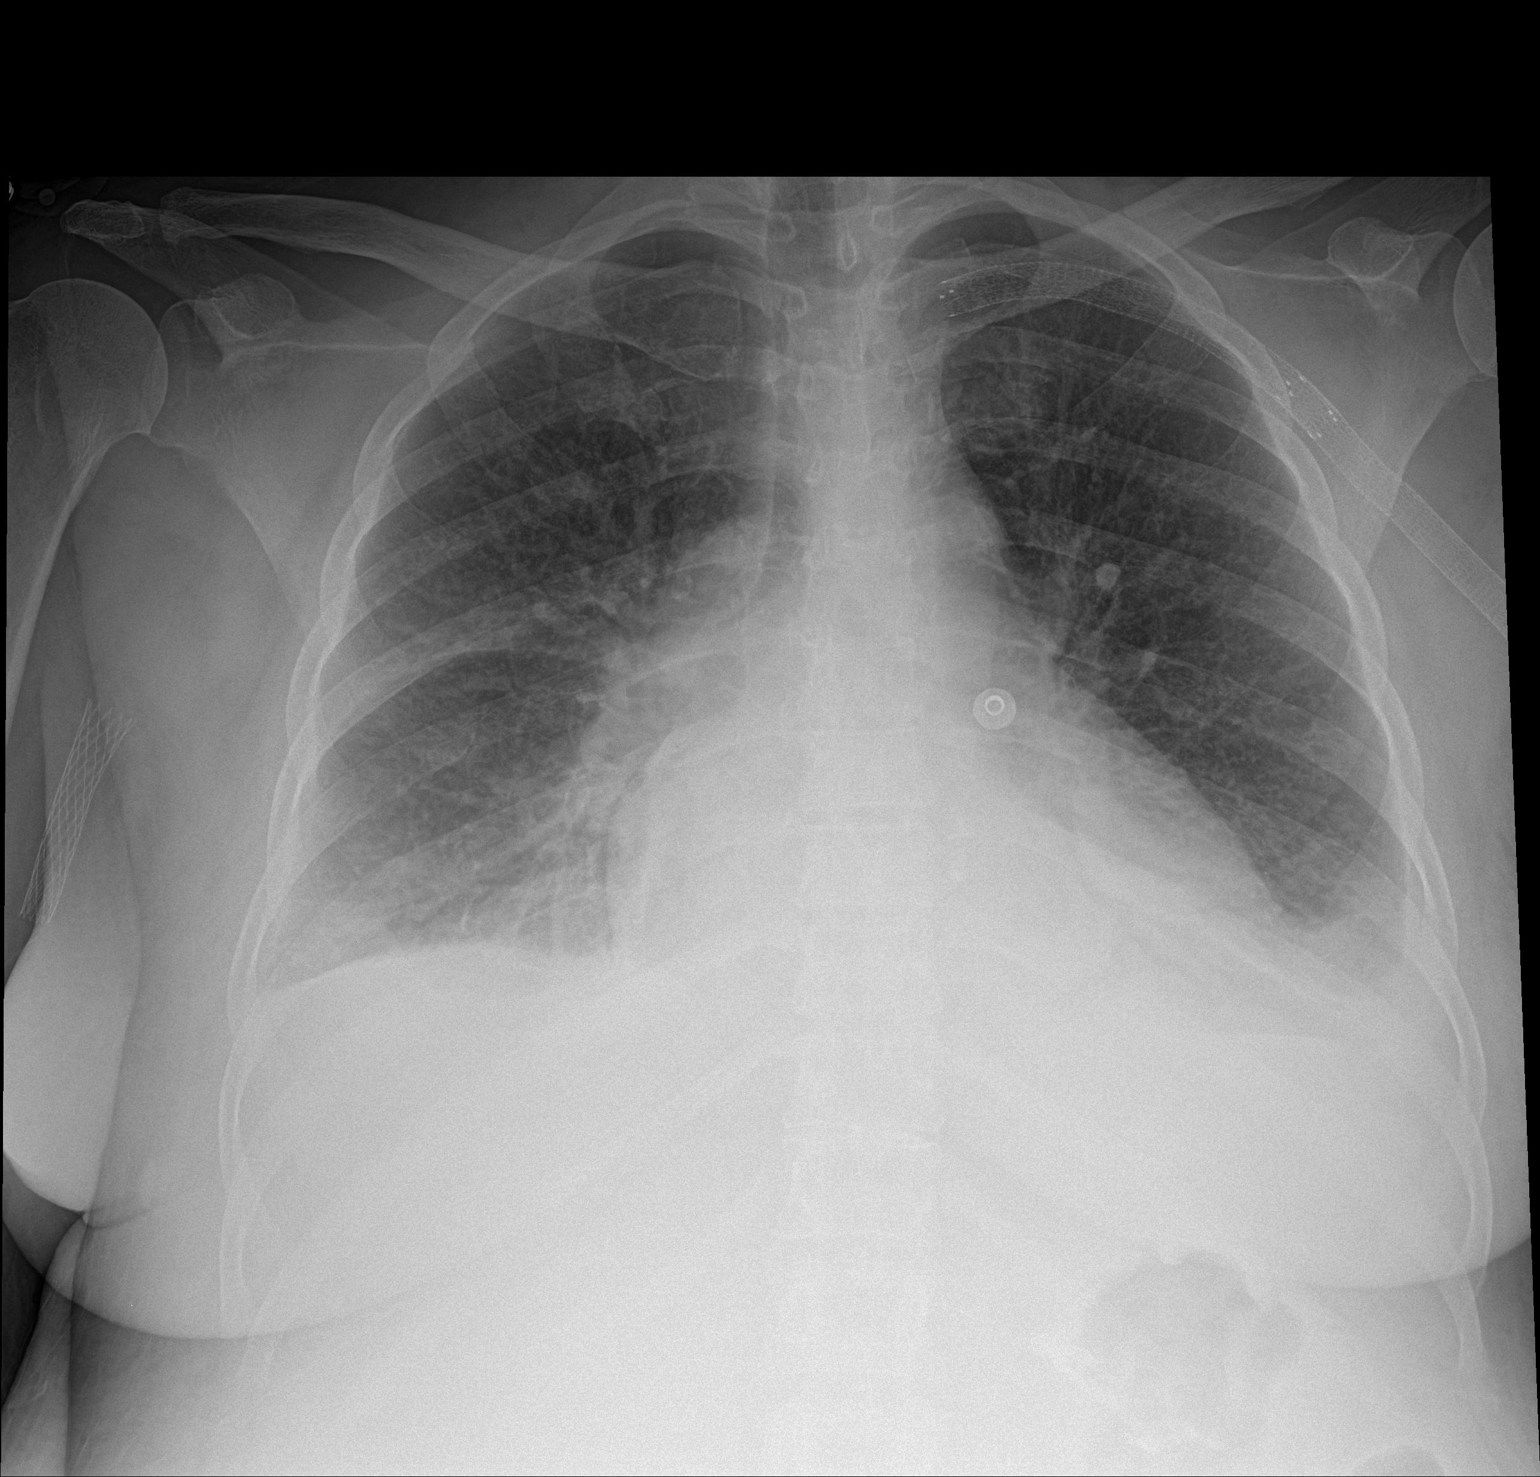

[2 of 2 positions shown; findings below may reference images not displayed]

FINDINGS: Normal cardiac silhouette. Bilateral small effusions. Mild
interstitial lung markings. LEFT vascular stent noted.
IMPRESSION: Mild interstitial edema and bilateral pleural effusions not changed
from prior. No overt pulmonary edema.

## 2016-01-07 NOTE — H&P (Signed)
[facility] Admission History & Physical  MRN : [id number]  [name] is a 55 y.o. (03-04-1961) female who presents with chief complaint of No chief complaint on file. 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 She has end-stage renal disease and is in need of dialysis access.  Sent from dialysis center to attempt salvage of this graft. No other complaints today  No current facility-administered medications for this encounter.   Current Outpatient Prescriptions  Medication Sig Dispense Refill  . albuterol (PROVENTIL) (2.5 MG/3ML) 0.083% nebulizer solution Take 2.5 mg by nebulization every 4 (four) hours as needed for wheezing or shortness of breath.    Marland Kitchen amLODipine (NORVASC) 10 MG tablet Take 10 mg by mouth daily.    Marland Kitchen aspirin EC 81 MG tablet Take 81 mg by mouth daily.    Marland Kitchen atorvastatin (LIPITOR) 20 MG tablet Take 20 mg by mouth every evening.    . B Complex-C-Biotin-E-FA (RENATABS) 1 MG TABS Take 1 mg by mouth daily. Reported on 12/28/2015    . B COMPLEX-C-CALCIUM PO Take 1 tablet by mouth daily.    . bisacodyl (DULCOLAX) 5 MG EC tablet Take 1 tablet (5 mg total) by mouth daily as needed for moderate constipation. 30 tablet 0  . carvedilol (COREG) 3.125 MG tablet Take 3.125 mg by mouth 2 (two) times daily with a meal.    . cetirizine (ZYRTEC) 10 MG tablet Take 10 mg by mouth daily.    . cholecalciferol (VITAMIN D) 1000 units tablet Take 1,000 Units by mouth daily.    . cholecalciferol (VITAMIN D) 400 units TABS tablet Take 400 Units by mouth daily.    . citalopram (CELEXA) 20 MG tablet Take 20 mg by mouth daily.    . clopidogrel (PLAVIX) 75 MG tablet Take 75 mg by mouth daily. Reported on 12/17/2015    . glucose 4 GM chewable tablet Chew 3-4 tablets by mouth as needed for low blood sugar (if pts blood sugar is less than 60).    . hydrALAZINE (APRESOLINE) 25 MG tablet Take 1 tablet (25 mg total) by mouth every  8 (eight) hours.    Marland Kitchen HYDROcodone-acetaminophen (NORCO/VICODIN) 5-325 MG tablet Take 1-2 tablets by mouth every 4 (four) hours as needed for moderate pain or severe pain.    . hydrocortisone (ANUSOL-HC) 25 MG suppository Place 25 mg rectally 2 (two) times daily.    . insulin detemir (LEVEMIR) 100 UNIT/ML injection Inject 6 Units into the skin at bedtime.    Marland Kitchen lactulose (CHRONULAC) 10 GM/15ML solution Take 30 g by mouth See admin instructions. To be given when patient returns from dialysis on Sun, Tues, Thurs, and Sat    . lipase/protease/amylase (CREON) 12000 UNITS CPEP capsule Take 12,000 Units by mouth 3 (three) times daily with meals.     . meclizine (ANTIVERT) 25 MG tablet Take 25 mg by mouth 2 (two) times daily.    . metoCLOPramide (REGLAN) 10 MG tablet Take 1 tablet (10 mg total) by mouth 3 (three) times daily before meals. 90 tablet 0  . nitroGLYCERIN (NITROSTAT) 0.4 MG SL tablet Place 0.4 mg under the tongue every 5 (five) minutes x 3 doses as needed for chest pain. *Max 3 doses per episode*    . omega-3 acid ethyl esters (LOVAZA) 1 g capsule Take 1 g by mouth daily.    Marland Kitchen oxymetazoline (AFRIN) 0.05 % nasal spray Place 2 sprays into both nostrils  2 (two) times daily. 15 mL 0  . polyethylene glycol (MIRALAX / GLYCOLAX) packet Take 17 g by mouth daily as needed for mild constipation.     . promethazine (PHENERGAN) 12.5 MG tablet Take 12.5 mg by mouth every 8 (eight) hours as needed for nausea or vomiting.    . ranitidine (ZANTAC) 150 MG tablet Take 150 mg by mouth 2 (two) times daily as needed for heartburn.    . sevelamer carbonate (RENVELA) 800 MG tablet Take 800 mg by mouth 3 (three) times daily with meals.       Past Medical History  Diagnosis Date  . Asthma   . Collagen vascular disease ([place])   . Diabetes mellitus without complication ([place])   . Hypertension   . Coronary artery disease     a. cath 2013: stenting to RCA (report not available); b. cath 2014: LM nl, pLAD 40%, mLAD nl,  ost LCx 40%, mid LCx nl, pRCA 30% @ site of prior stent, mRCA 50%  . Diabetic neuropathy ([place])   . ESRD (end stage renal disease) on dialysis ([place])     M-W-F  . GERD (gastroesophageal reflux disease)   . Hx of pancreatitis 2015  . Myocardial infarction ([place])   . Pneumonia   . Mitral regurgitation     a. echo 10/2013: EF 62%, noWMA, mildly dilated LA, mild to mod MR/TR, GR1DD  . COPD (chronic obstructive pulmonary disease) ([place])   . chronic diastolic CHF [id number]  . dialysis 2006  . Depression   . Anxiety   . Arthritis   . Headache   . Anemia   . Renal insufficiency     Past Surgical History  Procedure Laterality Date  . Cholecystectomy    . Appendectomy    . Abdominal hysterectomy    . Dialysis fistula creation Left     upper arm  . Esophagogastroduodenoscopy N/A 03/08/2015    Procedure: ESOPHAGOGASTRODUODENOSCOPY (EGD);  Surgeon: Manya Silvas, MD;  Location: [facility] ENDOSCOPY;  Service: Endoscopy;  Laterality: N/A;  . Cardiac catheterization Left 07/26/2015    Procedure: Left Heart Cath and Coronary Angiography;  Surgeon: Dionisio David, MD;  Location: [facility];  Service: Cardiovascular;  Laterality: Left;  Marland Kitchen Eye surgery    . Fecal transplant N/A 08/23/2015    Procedure: FECAL TRANSPLANT;  Surgeon: Manya Silvas, MD;  Location: [facility] ENDOSCOPY;  Service: Endoscopy;  Laterality: N/A;  . Peripheral vascular catheterization N/A 12/20/2015    Procedure: Thrombectomy of dialysis access versus permcath placement;  Surgeon: Algernon Huxley, MD;  Location: [facility];  Service: Cardiovascular;  Laterality: N/A;  . Peripheral vascular catheterization N/A 12/20/2015    Procedure: A/V Shunt Intervention;  Surgeon: Algernon Huxley, MD;  Location: [facility];  Service: Cardiovascular;  Laterality: N/A;  . Peripheral vascular catheterization N/A 12/20/2015    Procedure: A/V Shuntogram/Fistulagram;  Surgeon: Algernon Huxley, MD;  Location: [facility];  Service:  Cardiovascular;  Laterality: N/A;  . Peripheral vascular catheterization N/A 01/02/2016    Procedure: A/V Shuntogram/Fistulagram;  Surgeon: Algernon Huxley, MD;  Location: [facility];  Service: Cardiovascular;  Laterality: N/A;  . Peripheral vascular catheterization N/A 01/02/2016    Procedure: A/V Shunt Intervention;  Surgeon: Algernon Huxley, MD;  Location: [place] [facility];  Service: Cardiovascular;  Laterality: N/A;    Social History Social History  Substance Use Topics  . Smoking status: Former Smoker -- 0.50 packs/day  Types: Cigarettes    Quit date: 02/13/2015  . Smokeless tobacco: Never Used  . Alcohol Use: No  No IVDU  Family History Family History  Problem Relation Age of Onset  . Kidney disease Mother   . Diabetes Mother   no bleeding or clotting disorders  Allergies  Allergen Reactions  . Compazine [Prochlorperazine Edisylate] Anaphylaxis and Nausea And Vomiting  . Ace Inhibitors Swelling  . Ativan [Lorazepam] Other (See Comments)    Reaction:  Hallucinations and headaches  . Codeine Nausea And Vomiting  . Gabapentin Other (See Comments)    Reaction:  Unknown   . Losartan Other (See Comments)    Reaction:  Unknown   . Ondansetron Other (See Comments)    Reaction:  Unknown   . Prochlorperazine Other (See Comments)    Reaction:  Unknown   . Scopolamine Other (See Comments)    Reaction:  Unknown   . Zofran [Ondansetron Hcl] Other (See Comments)    Reaction:  hallucinations   . Oxycodone Anxiety  . Tape Rash     REVIEW OF SYSTEMS (Negative unless checked)  Constitutional: [x] Weight loss  [] Fever  [] Chills Cardiac: [] Chest pain   [] Chest pressure   [] Palpitations   [] Shortness of breath when laying flat   [] Shortness of breath at rest   [] Shortness of breath with exertion. Vascular:  [] Pain in legs with walking   [] Pain in legs at rest   [] Pain in legs when laying flat   [] Claudication   [] Pain in feet when walking  [] Pain in feet at rest  [] Pain in  feet when laying flat   [] History of DVT   [] Phlebitis   [] Swelling in legs   [] Varicose veins   [] Non-healing ulcers Pulmonary:   [] Uses home oxygen   [] Productive cough   [] Hemoptysis   [] Wheeze  [] COPD   [] Asthma Neurologic:  [] Dizziness  [] Blackouts   [] Seizures   [] History of stroke   [] History of TIA  [] Aphasia   [] Temporary blindness   [] Dysphagia   [] Weakness or numbness in arms   [] Weakness or numbness in legs Musculoskeletal:  [] Arthritis   [] Joint swelling   [] Joint pain   [] Low back pain Hematologic:  [] Easy bruising  [] Easy bleeding   [] Hypercoagulable state   [] Anemic  [] Hepatitis Gastrointestinal:  [] Blood in stool   [] Vomiting blood  [] Gastroesophageal reflux/heartburn   [] Difficulty swallowing. Genitourinary:  [x] Chronic kidney disease   [] Difficult urination  [] Frequent urination  [] Burning with urination   [] Blood in urine Skin:  [] Rashes   [] Ulcers   [] Wounds Psychological:  [] History of anxiety   []  History of major depression.  Physical Examination  Filed Vitals:   01/02/16 0928 01/02/16 1340 01/02/16 1345 01/02/16 1400  BP: 137/54 142/63 133/59 131/58  Pulse: 64 62 62 58  Temp: 98.2 F (36.8 C)     TempSrc: Oral     Resp: 16 12 11 13   Height: 5\' 5"  (1.651 m)     Weight: 64.864 kg (143 lb)     SpO2: 100% 100% 100% 100%   Body mass index is 23.8 kg/(m^2). Gen: WD/WN, NAD Head: [place]/AT, No temporalis wasting. Prominent temp pulse not noted. Ear/Nose/Throat: Hearing grossly intact, nares w/o erythema or drainage, oropharynx w/o Erythema/Exudate,  Eyes: PERRLA, EOMI.  Neck: Supple, no nuchal rigidity.  No JVD.  Pulmonary:  Good air movement, no use of accessory muscles.  Cardiac: RRR, normal S1, S2 Vascular: no bruit or thrill in AVG Vessel Right Left  Radial Palpable Palpable  Gastrointestinal: soft, non-tender/non-distended. No guarding/reflex.  Musculoskeletal: M/S 5/5 throughout.  Extremities without ischemic changes.   No deformity or atrophy.  Neurologic: CN 2-12 intact. Pain and light touch intact in extremities.  Symmetrical.  Speech is fluent. Motor exam as listed above. Psychiatric: Judgment intact, Mood & affect appropriate for pt's clinical situation. Dermatologic: No rashes or ulcers noted.  No cellulitis or open wounds. Lymph : No Cervical, Axillary, or Inguinal lymphadenopathy.    CBC Lab Results  Component Value Date   WBC 5.0 12/28/2015   HGB 8.7* 12/28/2015   HCT 27.6* 12/28/2015   MCV 85.2 12/28/2015   PLT 153 12/28/2015    BMET    Component Value Date/Time   NA 133* 12/28/2015 2344   NA 135* 09/15/2014 0948   K 3.3* 12/28/2015 2344   K 4.8 09/15/2014 0948   CL 96* 12/28/2015 2344   CL 99 09/15/2014 0948   CO2 31 12/28/2015 2344   CO2 26 09/15/2014 0948   GLUCOSE 222* 12/28/2015 2344   GLUCOSE 118* 09/15/2014 0948   BUN 17 12/28/2015 2344   BUN 19* 09/15/2014 0948   CREATININE 2.48* 12/28/2015 2344   CREATININE 6.79* 09/15/2014 0948   CALCIUM 7.9* 12/28/2015 2344   CALCIUM 8.3* 09/15/2014 0948   GFRNONAA 21* 12/28/2015 2344   GFRNONAA 7* 09/15/2014 0948   GFRNONAA 6* 05/31/2014 0432   GFRAA 24* 12/28/2015 2344   GFRAA 8* 09/15/2014 0948   GFRAA 7* 05/31/2014 0432   Estimated Creatinine Clearance: 22.8 mL/min (by C-G formula based on Cr of 2.48).  COAG Lab Results  Component Value Date   INR 0.97 12/28/2015   INR 1.01 07/26/2015   INR 0.9 02/25/2012    Radiology Dg Abd 1 View  12/19/2015  CLINICAL DATA:  Status post catheter placement EXAM: ABDOMEN - 1 VIEW COMPARISON:  12/17/2015 FINDINGS: Right groin hemodialysis catheter is identified with tip in the expected location of the IVC. There are cholecystectomy clips noted in the right upper quadrant of the abdomen. Normal bowel gas pattern. IMPRESSION: 1. Tip of the hemodialysis catheter is in the expected location of the IVC. Electronically Signed   By: Kerby Moors M.D.   On: 12/19/2015 08:37   Dg Abd Acute  W/chest  12/20/2015  CLINICAL DATA:  Abdominal pain EXAM: DG ABDOMEN ACUTE W/ 1V CHEST COMPARISON:  12/17/2015 and 12/19/2015 FINDINGS: Cardiomediastinal silhouette is stable cardiomegaly again noted. Persistent mild congestion/ interstitial edema. Again noted bilateral small pleural effusion with bilateral basilar atelectasis or infiltrate. Stable right femoral dialysis catheter with tip in IVC. There is normal small bowel gas pattern. Postcholecystectomy surgical clips are noted. Some colonic gas noted in transverse colon. IMPRESSION: Persistent mild congestion/ interstitial edema. Again noted small bilateral pleural effusion with bilateral basilar atelectasis or infiltrate. Stable right femoral dialysis catheter position with tip in IVC. Normal small bowel gas pattern. Postcholecystectomy surgical clips. Electronically Signed   By: Lahoma Crocker M.D.   On: 12/20/2015 08:06   Dg Abd Acute W/chest  12/17/2015  CLINICAL DATA:  Nausea and abdominal pain.  Chronic renal failure. EXAM: DG ABDOMEN ACUTE W/ 1V CHEST COMPARISON:  Chest radiograph November 28, 2015; abdominal radiograph November 17, 2015 FINDINGS: PA chest: There are bilateral pleural effusions with bibasilar atelectasis. There is mild cardiomegaly with pulmonary venous hypertension. No frank edema. There is a stent in the subclavian and axillary regions on the left. Supine and upright abdomen: There is diffuse stool throughout colon. There is no bowel dilatation or air-fluid level  suggesting obstruction. No free air. There is extensive vascular calcification. There are surgical clips in right upper quadrant region. IMPRESSION: Diffuse stool throughout colon. No bowel obstruction or free air. Evidence of a degree of congestive heart failure. Electronically Signed   By: Lowella Grip III M.D.   On: 12/17/2015 10:27      Assessment/Plan 1. Recurrent thrombosis of AVG.  Attempting salvage.  Risks and benefits discussed 2. ESRD. Needs access. See  above   Deyvi Bonanno, MD  01/07/2016 1:02 PM

## 2016-02-07 ENCOUNTER — Encounter: Payer: Self-pay | Admitting: Urology

## 2016-02-08 IMAGING — CR DG CHEST 1V PORT
1 series · 1 of 1 positions shown · non-contrast
Comparison: Radiograph dated 09/01/2015

CLINICAL DATA: 54-year-old female with respiratory distress.

EXAM:
PORTABLE CHEST 1 VIEW

[ap]
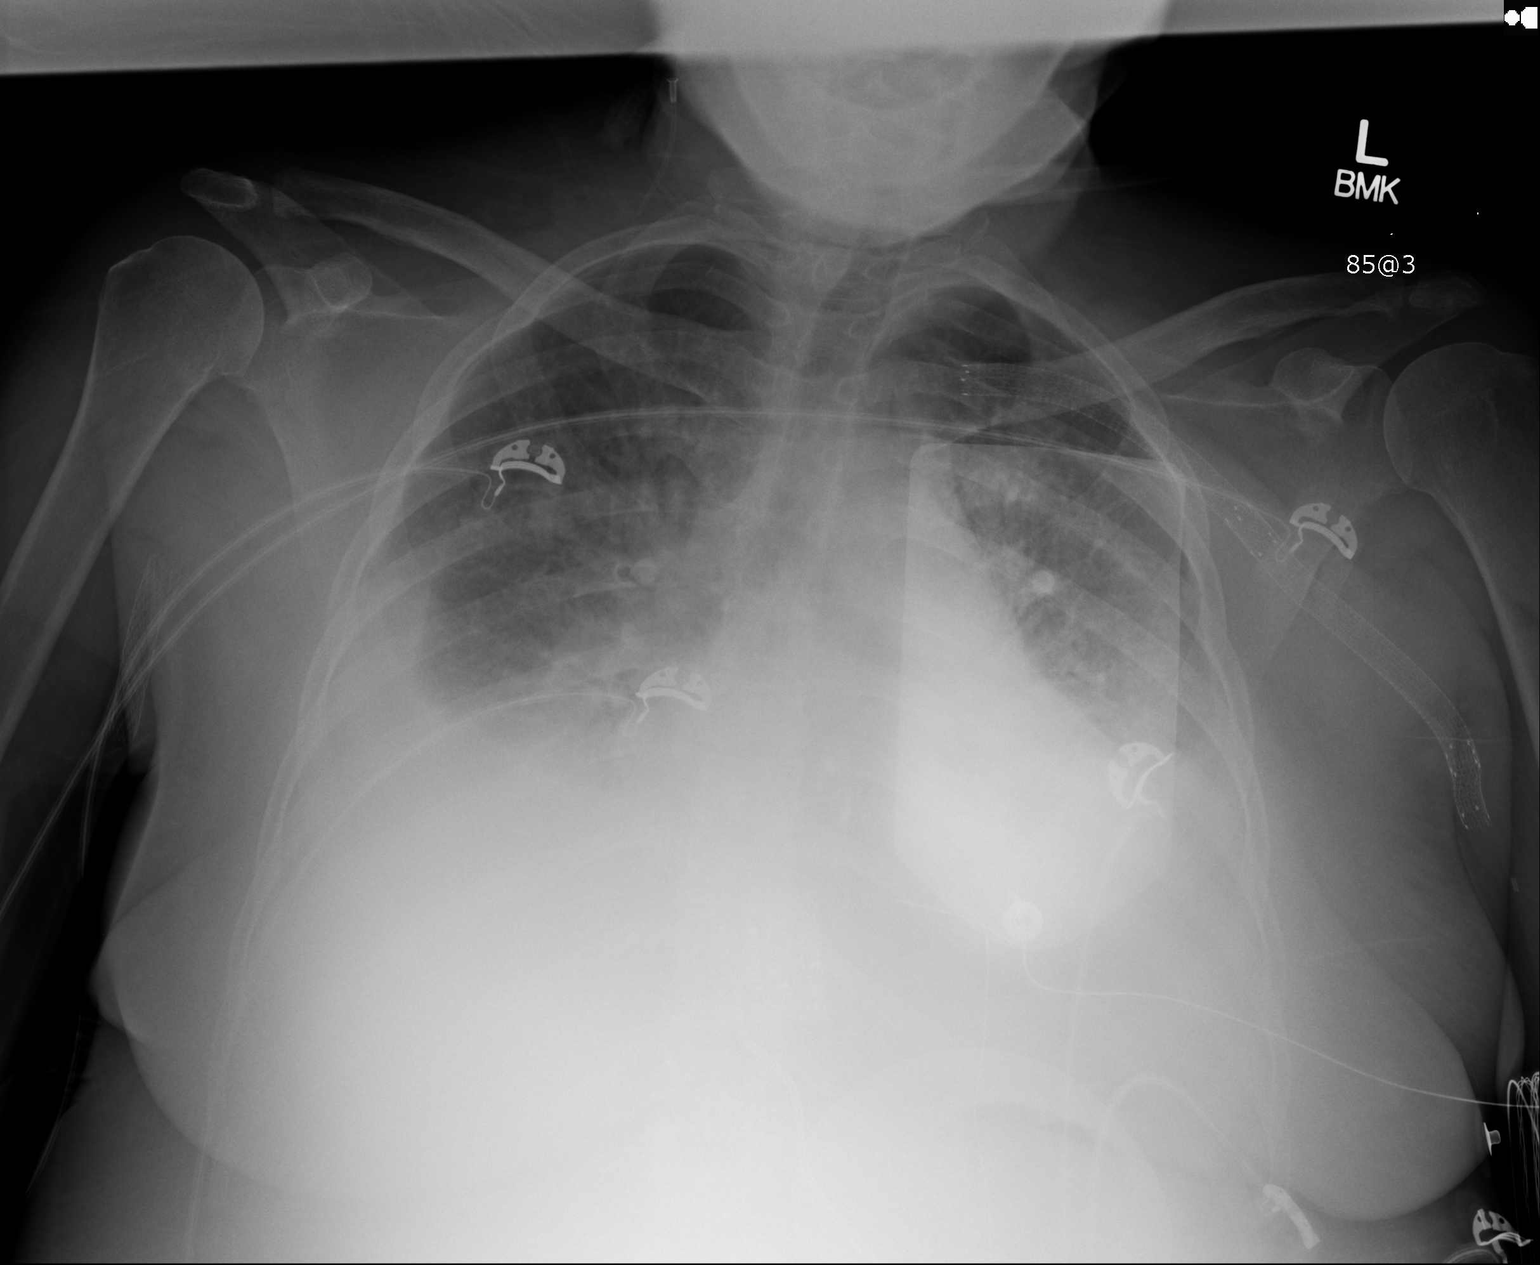

[1 of 1 positions shown; findings below may reference images not displayed]

FINDINGS: Two views of the chest demonstrate bilateral small to moderate
pleural effusions with associated partial compressive atelectasis of
the lower lobes. Pneumonia is not excluded. Stable cardiomegaly with
central vascular prominence likely congestive changes. No
pneumothorax. Left subclavian and axillary vascular stent is again
noted.
IMPRESSION: Congestive changes with small to moderate bilateral pleural
effusions. Superimposed pneumonia is not excluded. Clinical
correlation and follow-up recommended.

## 2016-02-12 IMAGING — CR DG CHEST 1V PORT
1 series · 1 of 1 positions shown · non-contrast
Comparison: Chest radiograph October 03, 2015

CLINICAL DATA: Discharged from hospital October 04, 2014. Acute
onset shortness of breath. History of dialysis, diabetes,
pancreatitis, hypertension.

EXAM:
PORTABLE CHEST 1 VIEW

[portable]
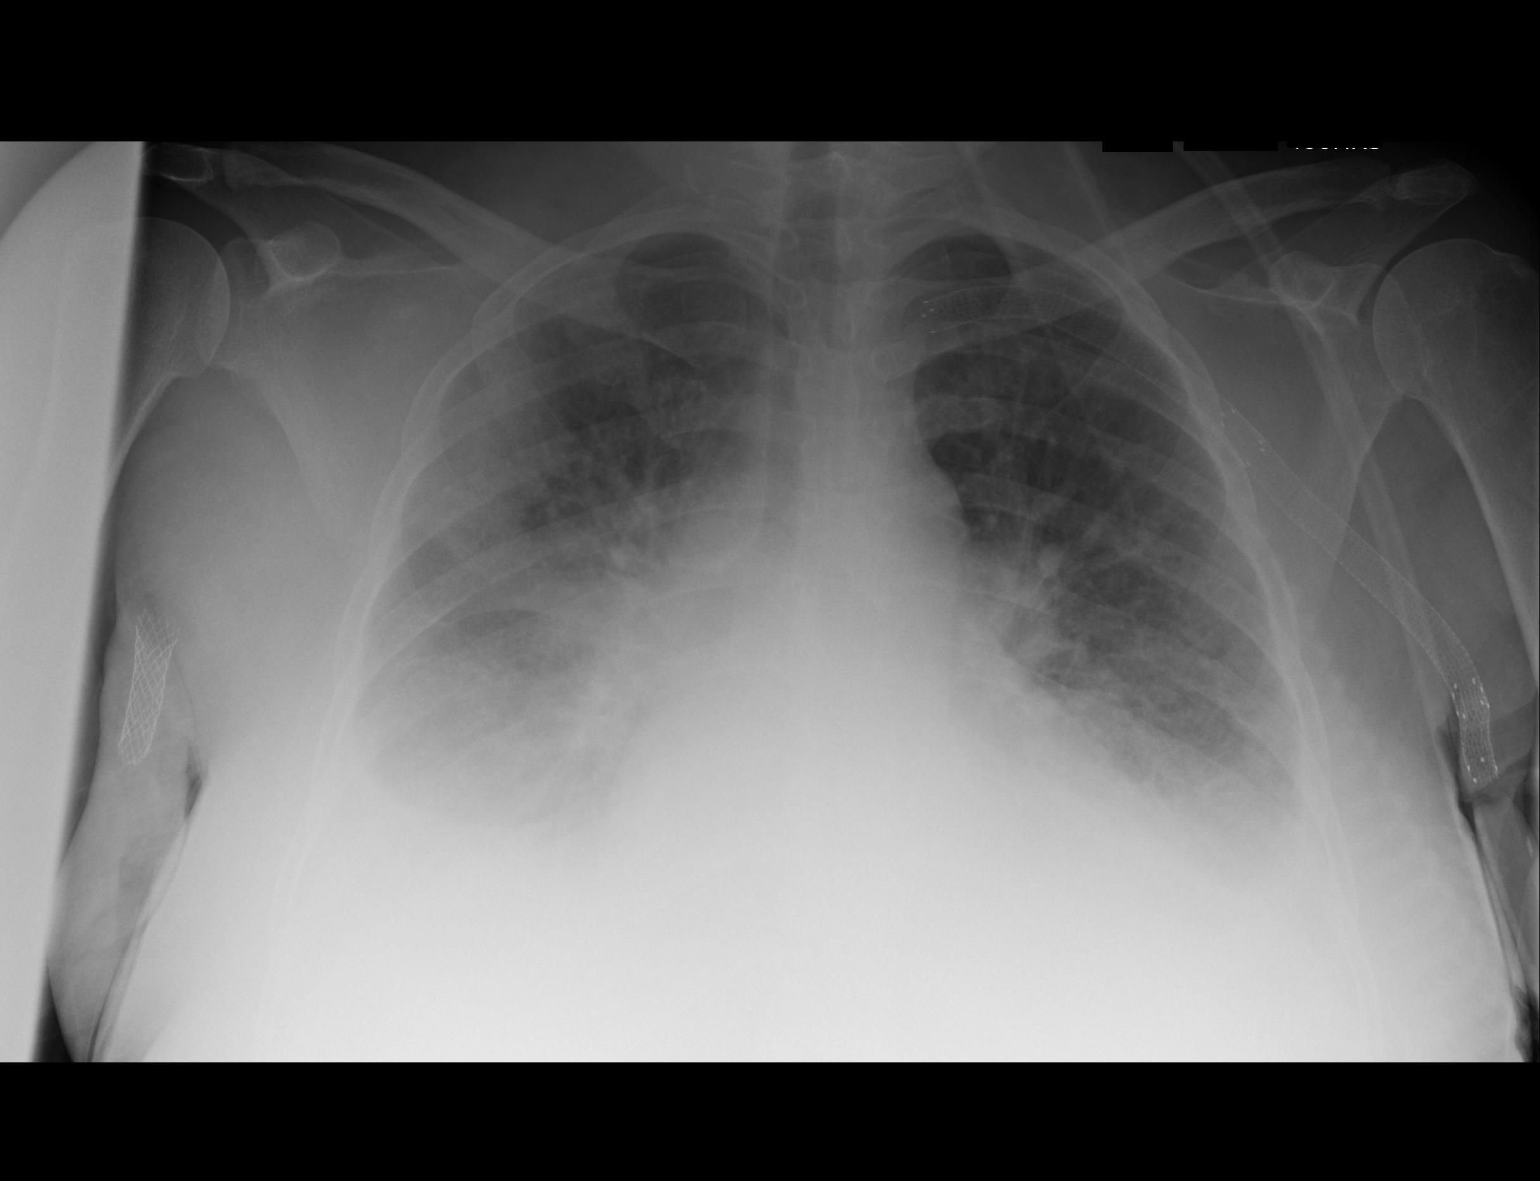

[1 of 1 positions shown; findings below may reference images not displayed]

FINDINGS: The cardiac silhouette appears mildly enlarged, mediastinal
silhouette is unremarkable. Similar pulmonary vascular congestion,
interstitial prominence. Small to moderate RIGHT, small LEFT pleural
effusions appear slightly decreased. Patchy bibasilar airspace
opacities. No pneumothorax. Multiple vascular stents. Osseous
structures are nonsuspicious.
IMPRESSION: Stable cardiomegaly and findings of CHF with slightly decreased
small to moderate RIGHT, small LEFT pleural effusion. Bibasilar
atelectasis or confluent edema, less likely pneumonia.

## 2016-02-15 IMAGING — CT CT CHEST W/ CM
1 series · 15 of 31 positions shown, 19 images · IV contrast (omnipaque)
Comparison: Portable chest radiographs 10/07/2015 and earlier. CT
Abdomen and Pelvis 07/22/2015.

CLINICAL DATA: 54-year-old female with shortness of breath for 1
week and abnormal chest x-ray. Chronic dialysis patient. Initial
encounter.

EXAM:
CT CHEST WITH CONTRAST
TECHNIQUE: Multidetector CT imaging of the chest was performed during
intravenous contrast administration.
CONTRAST:  75mL OMNIPAQUE IOHEXOL 300 MG/ML  SOLN

[Series 2: routine chest with · axial · 0.62mm/px · z∈[-738,-463]mm · 15 of 61 slices shown, 19 images]
[im 3/61  mediastinal]
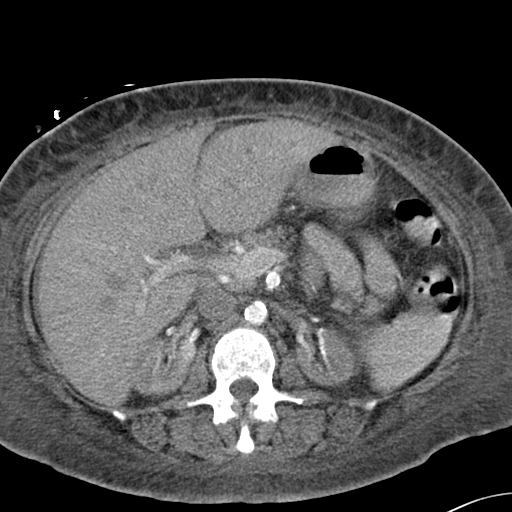
[im 3/61  lung]
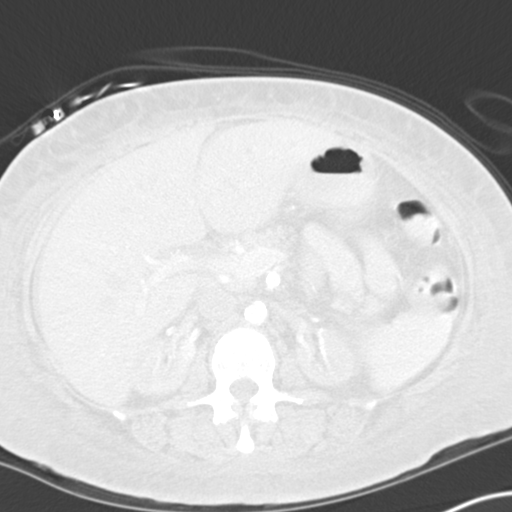
[im 7/61  lung]
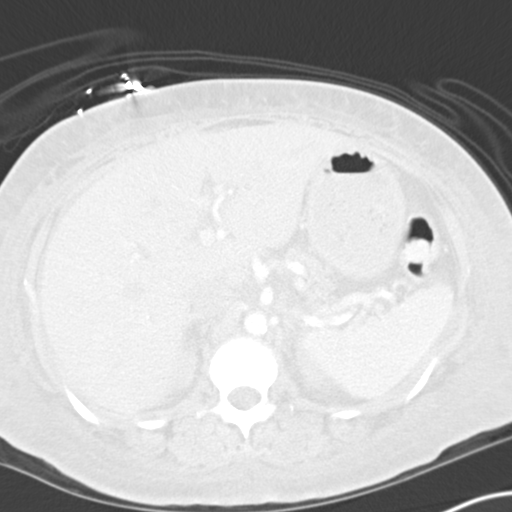
[im 12/61  lung]
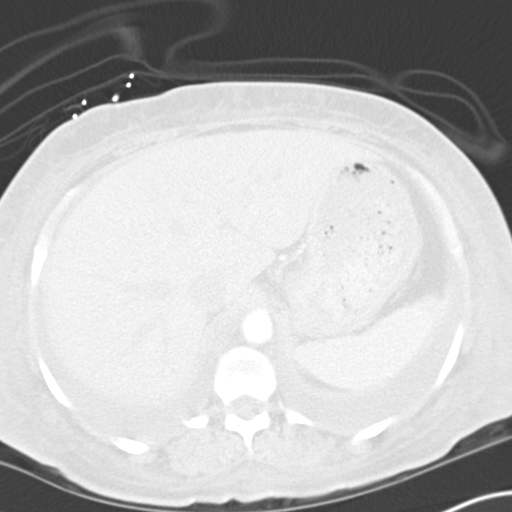
[im 14/61  lung]
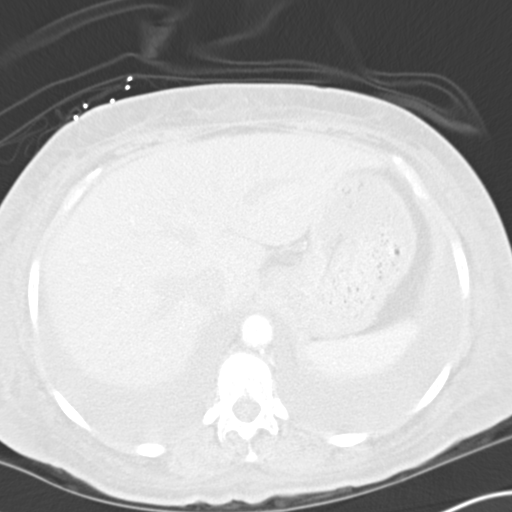
[im 18/61  mediastinal]
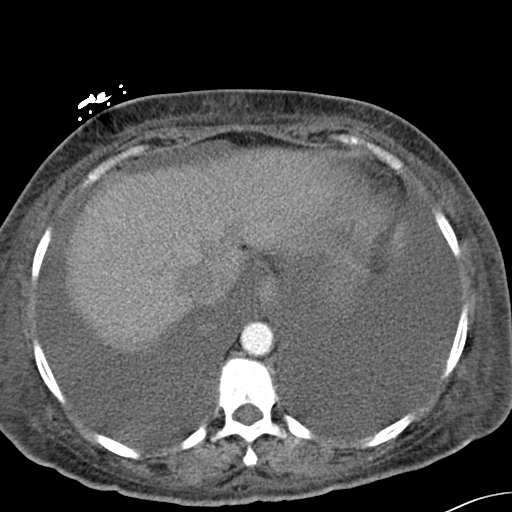
[im 18/61  lung]
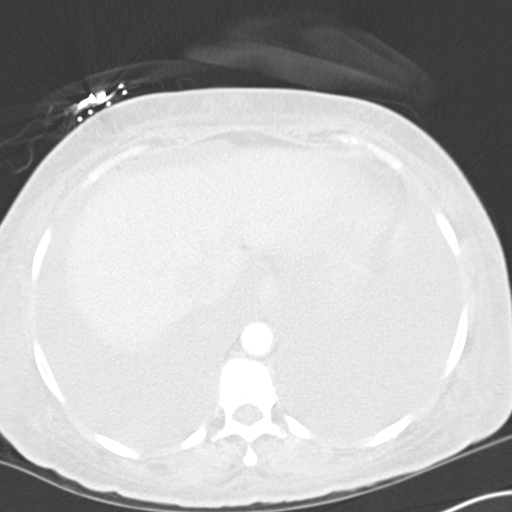
[im 23/61  lung]
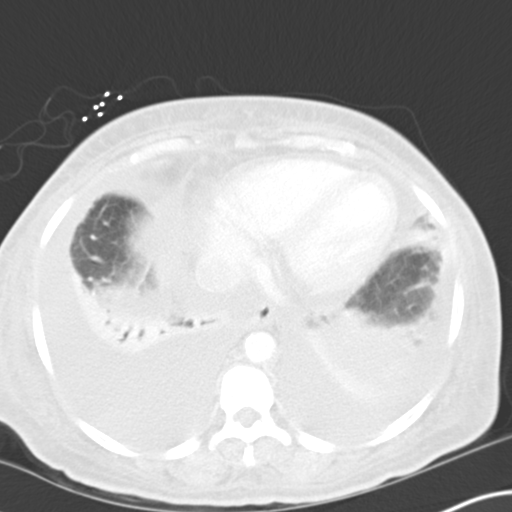
[im 27/61  lung]
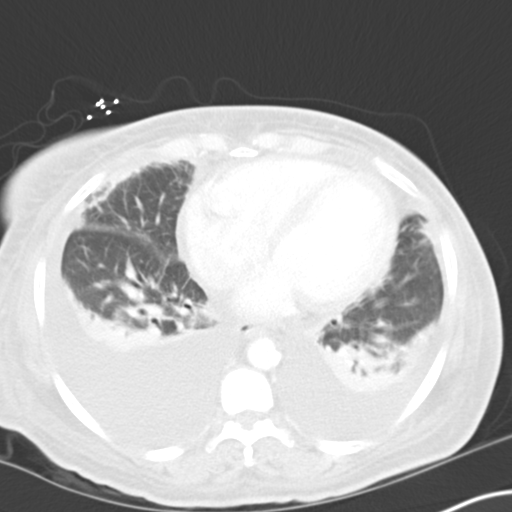
[im 32/61  lung]
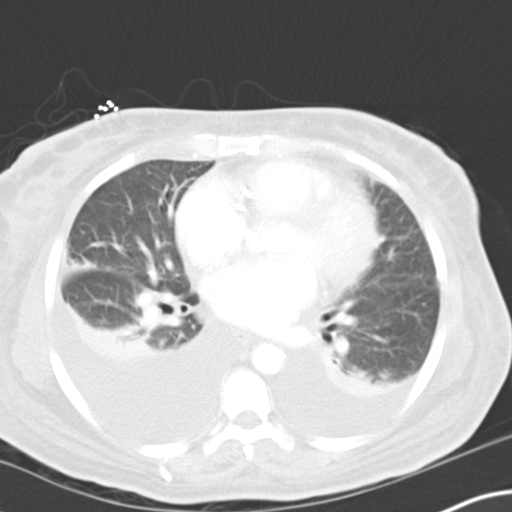
[im 34/61  mediastinal]
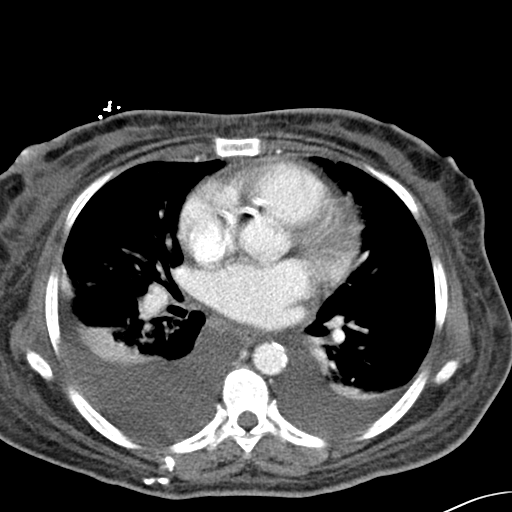
[im 34/61  lung]
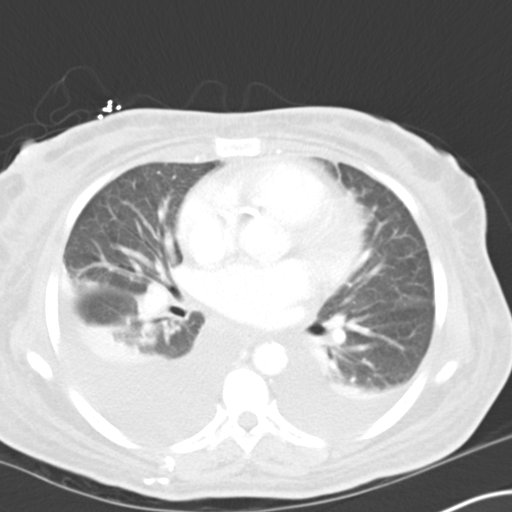
[im 37/61  lung]
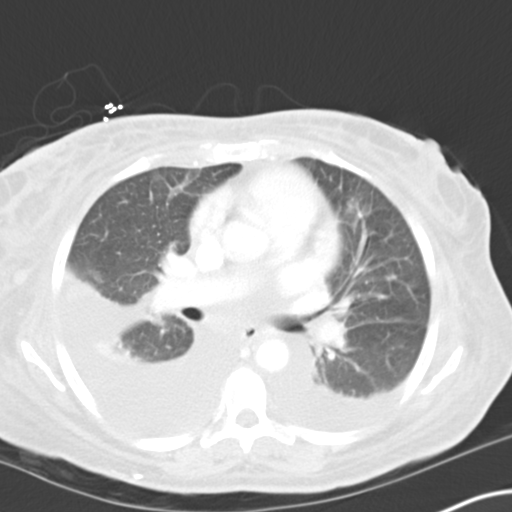
[im 41/61  lung]
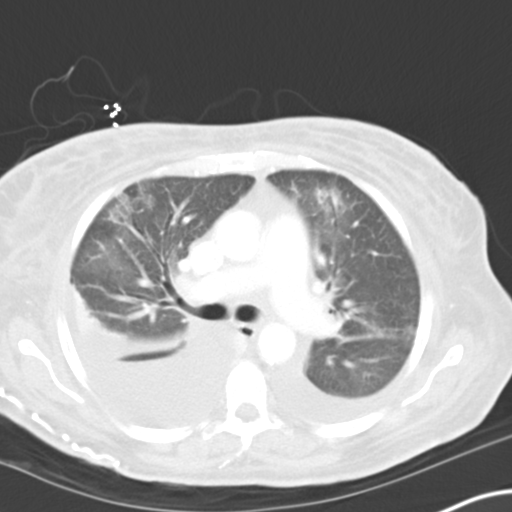
[im 45/61  lung]
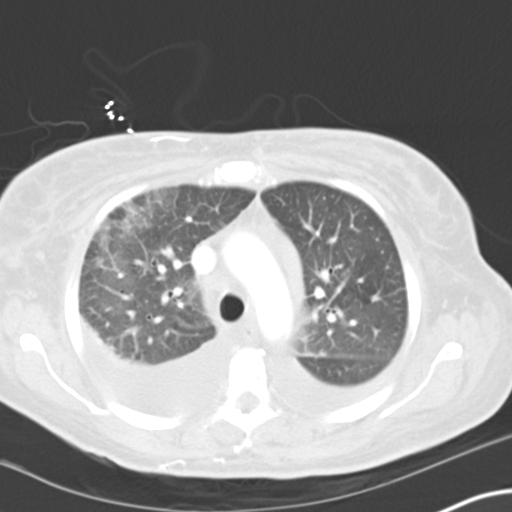
[im 49/61  mediastinal]
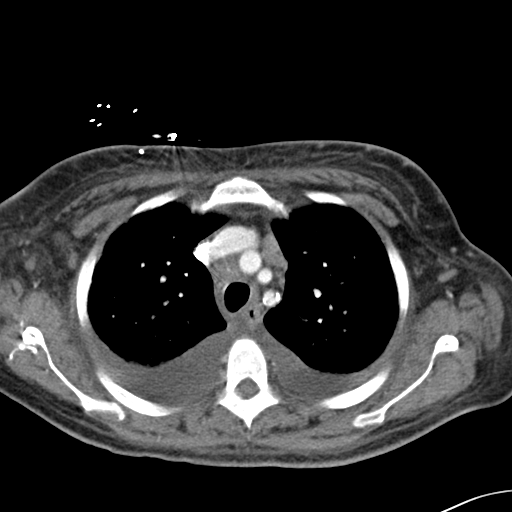
[im 49/61  lung]
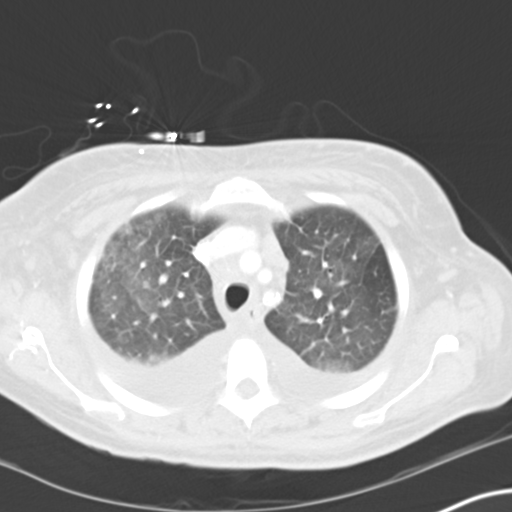
[im 54/61  lung]
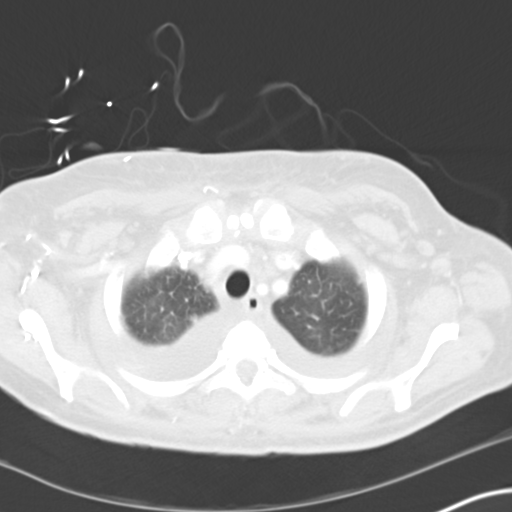
[im 58/61  lung]
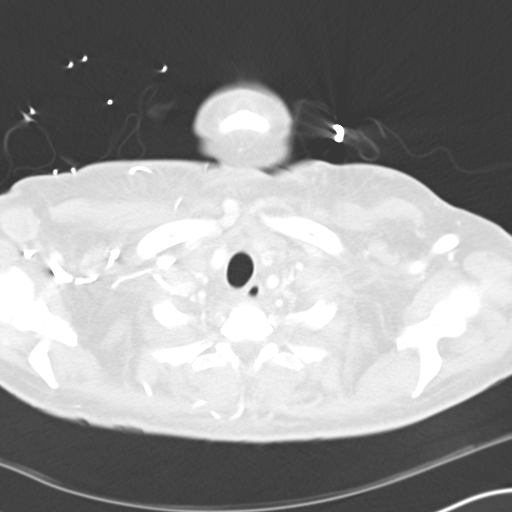

[15 of 31 positions shown; findings below may reference images not displayed]

FINDINGS: Moderate to large right and moderate left layering pleural
effusions. Pleural fluid tracks into both lung apices, more so the
right. Associated compressive atelectasis in both lungs, especially
affecting both lower lobes. Major airways are patent. Superimposed
upper lobe ground-glass opacity and septal thickening. There is more
confluent peripheral and peri-bronchovascular opacity about both
hila (series 3, image 20).

Compared to [place], both pleural effusions appear mildly increased.

Cardiomegaly. No pericardial effusion. Calcified aortic and coronary
artery atherosclerosis. Central pulmonary arteries are patent. Mild,
reactive appearing mediastinal lymph nodes. No hilar
lymphadenopathy.

There is a left thoracic inlet vascular graft to the left innominate
vein. Otherwise negative thoracic inlet. Mildly increased, reactive
appearing axillary lymph nodes slightly greater on the right (series
2, image 11). Generalized body wall stranding suggesting body wall
edema.

Surgically absent gallbladder. Visualized liver, spleen, pancreas,
adrenal glands, and bowel in the upper abdomen are within normal
limits. Native renal atrophy and occasional hyperdense cysts noted.

Increased sclerosis likely due to renal osteodystrophy. No acute
osseous abnormality identified.
IMPRESSION: 1. Constellation of findings most compatible with anasarca and/or
heart failure.
2. Moderate to large pleural effusions greater on the right.
Pulmonary interstitial edema. Generalized body wall edema.
3. Additional confluent peripheral and peri-bronchovascular
pulmonary opacity at the hila, favor atelectasis over superimposed
bilateral pneumonia.

## 2016-02-16 ENCOUNTER — Inpatient Hospital Stay
Admission: EM | Admit: 2016-02-16 | Discharge: 2016-02-19 | DRG: 073 | Disposition: A | Discharge status: Home Health | Payer: Medicare Other | Attending: Internal Medicine | Admitting: Internal Medicine

## 2016-02-16 ENCOUNTER — Emergency Department: Payer: Medicare Other

## 2016-02-16 ENCOUNTER — Encounter: Payer: Self-pay | Admitting: Emergency Medicine

## 2016-02-16 ENCOUNTER — Inpatient Hospital Stay: Payer: Medicare Other

## 2016-02-16 DIAGNOSIS — E785 Hyperlipidemia, unspecified: Secondary | ICD-10-CM | POA: Diagnosis present

## 2016-02-16 DIAGNOSIS — J9811 Atelectasis: Secondary | ICD-10-CM | POA: Diagnosis present

## 2016-02-16 DIAGNOSIS — D631 Anemia in chronic kidney disease: Secondary | ICD-10-CM | POA: Diagnosis present

## 2016-02-16 DIAGNOSIS — I2510 Atherosclerotic heart disease of native coronary artery without angina pectoris: Secondary | ICD-10-CM | POA: Diagnosis present

## 2016-02-16 DIAGNOSIS — J45909 Unspecified asthma, uncomplicated: Secondary | ICD-10-CM | POA: Diagnosis present

## 2016-02-16 DIAGNOSIS — J449 Chronic obstructive pulmonary disease, unspecified: Secondary | ICD-10-CM | POA: Diagnosis present

## 2016-02-16 DIAGNOSIS — I132 Hypertensive heart and chronic kidney disease with heart failure and with stage 5 chronic kidney disease, or end stage renal disease: Secondary | ICD-10-CM | POA: Diagnosis present

## 2016-02-16 DIAGNOSIS — N2581 Secondary hyperparathyroidism of renal origin: Secondary | ICD-10-CM | POA: Diagnosis present

## 2016-02-16 DIAGNOSIS — Z87891 Personal history of nicotine dependence: Secondary | ICD-10-CM | POA: Diagnosis not present

## 2016-02-16 DIAGNOSIS — R079 Chest pain, unspecified: Secondary | ICD-10-CM

## 2016-02-16 DIAGNOSIS — J90 Pleural effusion, not elsewhere classified: Secondary | ICD-10-CM | POA: Diagnosis present

## 2016-02-16 DIAGNOSIS — E1143 Type 2 diabetes mellitus with diabetic autonomic (poly)neuropathy: Principal | ICD-10-CM | POA: Diagnosis present

## 2016-02-16 DIAGNOSIS — Z7982 Long term (current) use of aspirin: Secondary | ICD-10-CM

## 2016-02-16 DIAGNOSIS — N186 End stage renal disease: Secondary | ICD-10-CM | POA: Diagnosis present

## 2016-02-16 DIAGNOSIS — K8500 Idiopathic acute pancreatitis without necrosis or infection: Secondary | ICD-10-CM | POA: Diagnosis present

## 2016-02-16 DIAGNOSIS — Z79899 Other long term (current) drug therapy: Secondary | ICD-10-CM

## 2016-02-16 DIAGNOSIS — Z833 Family history of diabetes mellitus: Secondary | ICD-10-CM

## 2016-02-16 DIAGNOSIS — E875 Hyperkalemia: Secondary | ICD-10-CM | POA: Diagnosis present

## 2016-02-16 DIAGNOSIS — E1122 Type 2 diabetes mellitus with diabetic chronic kidney disease: Secondary | ICD-10-CM | POA: Diagnosis present

## 2016-02-16 DIAGNOSIS — N2889 Other specified disorders of kidney and ureter: Secondary | ICD-10-CM | POA: Diagnosis present

## 2016-02-16 DIAGNOSIS — I5032 Chronic diastolic (congestive) heart failure: Secondary | ICD-10-CM | POA: Diagnosis present

## 2016-02-16 DIAGNOSIS — K3184 Gastroparesis: Secondary | ICD-10-CM | POA: Diagnosis present

## 2016-02-16 DIAGNOSIS — Z841 Family history of disorders of kidney and ureter: Secondary | ICD-10-CM | POA: Diagnosis not present

## 2016-02-16 DIAGNOSIS — E11319 Type 2 diabetes mellitus with unspecified diabetic retinopathy without macular edema: Secondary | ICD-10-CM | POA: Diagnosis present

## 2016-02-16 DIAGNOSIS — F419 Anxiety disorder, unspecified: Secondary | ICD-10-CM | POA: Diagnosis present

## 2016-02-16 DIAGNOSIS — K219 Gastro-esophageal reflux disease without esophagitis: Secondary | ICD-10-CM | POA: Diagnosis present

## 2016-02-16 DIAGNOSIS — Z992 Dependence on renal dialysis: Secondary | ICD-10-CM | POA: Diagnosis not present

## 2016-02-16 DIAGNOSIS — Z7901 Long term (current) use of anticoagulants: Secondary | ICD-10-CM

## 2016-02-16 DIAGNOSIS — R109 Unspecified abdominal pain: Secondary | ICD-10-CM | POA: Diagnosis present

## 2016-02-16 LAB — GLUCOSE, CAPILLARY
Glucose-Capillary: 66 mg/dL (ref 65–99)
Glucose-Capillary: 103 mg/dL — ABNORMAL HIGH (ref 65–99)
Glucose-Capillary: 66 mg/dL (ref 65–99)
Glucose-Capillary: 122 mg/dL — ABNORMAL HIGH (ref 65–99)

## 2016-02-16 LAB — COMPREHENSIVE METABOLIC PANEL
ALT: 16 U/L (ref 14–54)
AST: 31 U/L (ref 15–41)
Albumin: 3.1 g/dL — ABNORMAL LOW (ref 3.5–5.0)
Alkaline Phosphatase: 116 U/L (ref 38–126)
Anion gap: 10 (ref 5–15)
BUN: 18 mg/dL (ref 6–20)
CO2: 28 mmol/L (ref 22–32)
Calcium: 8.8 mg/dL — ABNORMAL LOW (ref 8.9–10.3)
Chloride: 97 mmol/L — ABNORMAL LOW (ref 101–111)
Creatinine, Ser: 3.54 mg/dL — ABNORMAL HIGH (ref 0.44–1.00)
GFR calc Af Amer: 16 mL/min — ABNORMAL LOW (ref >60)
GFR calc non Af Amer: 13 mL/min — ABNORMAL LOW (ref >60)
Glucose, Bld: 69 mg/dL (ref 65–99)
Potassium: 4.7 mmol/L (ref 3.5–5.1)
Sodium: 135 mmol/L (ref 135–145)
Total Bilirubin: 0.6 mg/dL (ref 0.3–1.2)
Total Protein: 7.4 g/dL (ref 6.5–8.1)

## 2016-02-16 LAB — LIPASE, BLOOD: Lipase: 58 U/L — ABNORMAL HIGH (ref 11–51)

## 2016-02-16 LAB — MRSA PCR SCREENING: MRSA by PCR: NEGATIVE

## 2016-02-16 LAB — CBC
HCT: 36.6 % (ref 35.0–47.0)
Hemoglobin: 11.6 g/dL — ABNORMAL LOW (ref 12.0–16.0)
MCH: 26 pg (ref 26.0–34.0)
MCHC: 31.8 g/dL — ABNORMAL LOW (ref 32.0–36.0)
MCV: 82 fL (ref 80.0–100.0)
Platelets: 223 10*3/uL (ref 150–440)
RBC: 4.47 MIL/uL (ref 3.80–5.20)
RDW: 21.2 % — ABNORMAL HIGH (ref 11.5–14.5)
WBC: 7.8 10*3/uL (ref 3.6–11.0)

## 2016-02-16 LAB — TROPONIN I: Troponin I: 0.05 ng/mL — ABNORMAL HIGH (ref <0.031)

## 2016-02-16 IMAGING — CR DG CHEST 1V
1 series · 1 of 1 positions shown · non-contrast
Comparison: 10/07/2015

CLINICAL DATA: Pleural effusions, post right thoracentesis

EXAM:
CHEST  1 VIEW

[dg chest 1 view]
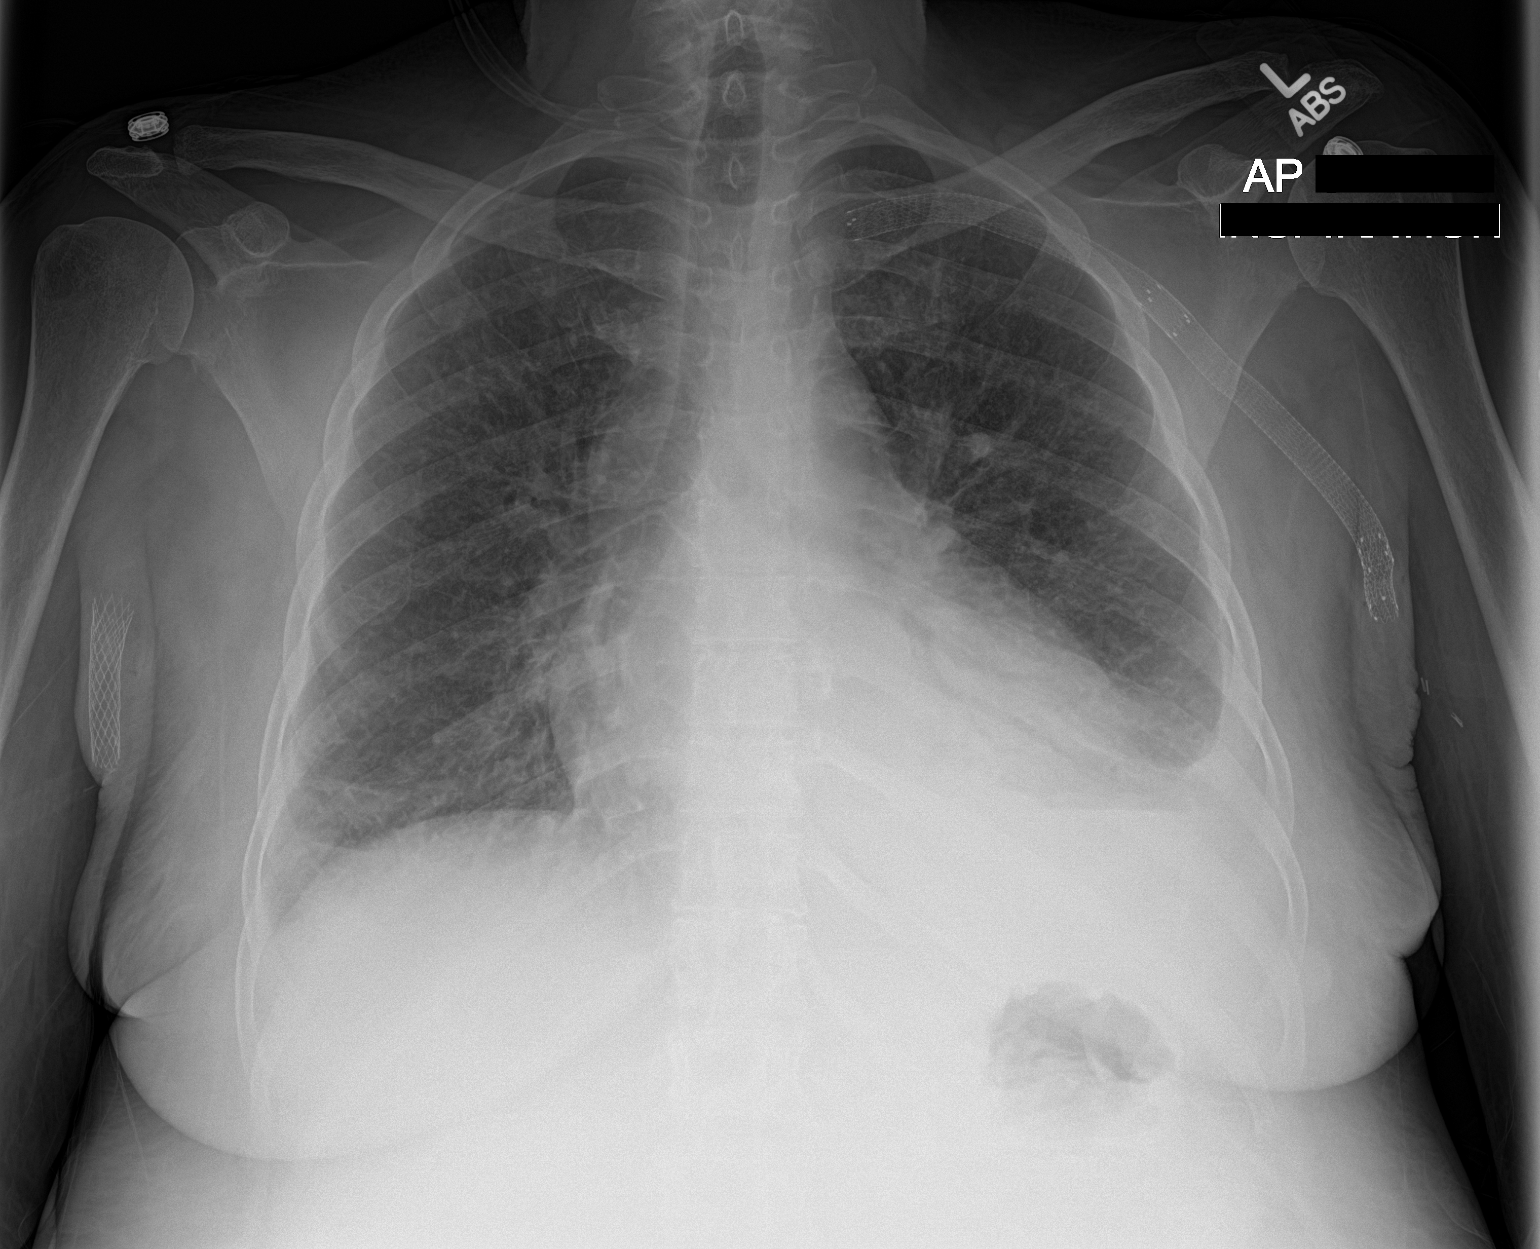

[1 of 1 positions shown; findings below may reference images not displayed]

FINDINGS: Near complete resolution of the visualized right pleural effusion.
No pneumothorax.

Persistent moderate left pleural effusion. Persisting
consolidation/atelectasis at the left lung base with air
bronchograms. Improved aeration of the right lung base. Mild diffuse
interstitial edema and central pulmonary vascular congestion. Heart
size upper limits normal for technique. Bilateral brachial and left
subclavian vascular stents are noted.
New
IMPRESSION: 1. No pneumothorax post right thoracentesis, with no residual
effusion evident.
2. Persistent moderate left pleural effusion with adjacent
consolidation/atelectasis.
3. Stable interstitial edema.

## 2016-02-16 IMAGING — US US THORACENTESIS ASP PLEURAL SPACE W/IMG GUIDE
1 series · 3 of 3 positions shown · non-contrast
Comparison: none

CLINICAL DATA: Bilateral pleural effusions. Shortness of breath.
Tolerated previous thoracentesis 02/21/2015.

EXAM:
EXAM
THORACENTESIS WITH ULTRASOUND
TECHNIQUE: The procedure, risks (including but not limited to bleeding,
infection, organ damage ), benefits, and alternatives were explained
to the patient. Questions regarding the procedure were encouraged
and answered. The patient understands and consents to the procedure.
Survey ultrasound of the right hemithorax was performed and an
appropriate skin entry site was localized. Site was marked, prepped
with Betadine, draped in usual sterile fashion, infiltrated locally
with 1% lidocaine.
The [place] needle was advanced into the pleural space. Dark
amber fluid returned. 1 L was removed. Post procedure ultrasound
shows no residual fluid. The patient tolerated procedure well.
COMPLICATIONS:
COMPLICATIONS
None immediate

[Series 1: us thoracentesis asp pleural space w/img guide · 0.26mm/px · 3 of 3 slices shown]
[im 1/3]
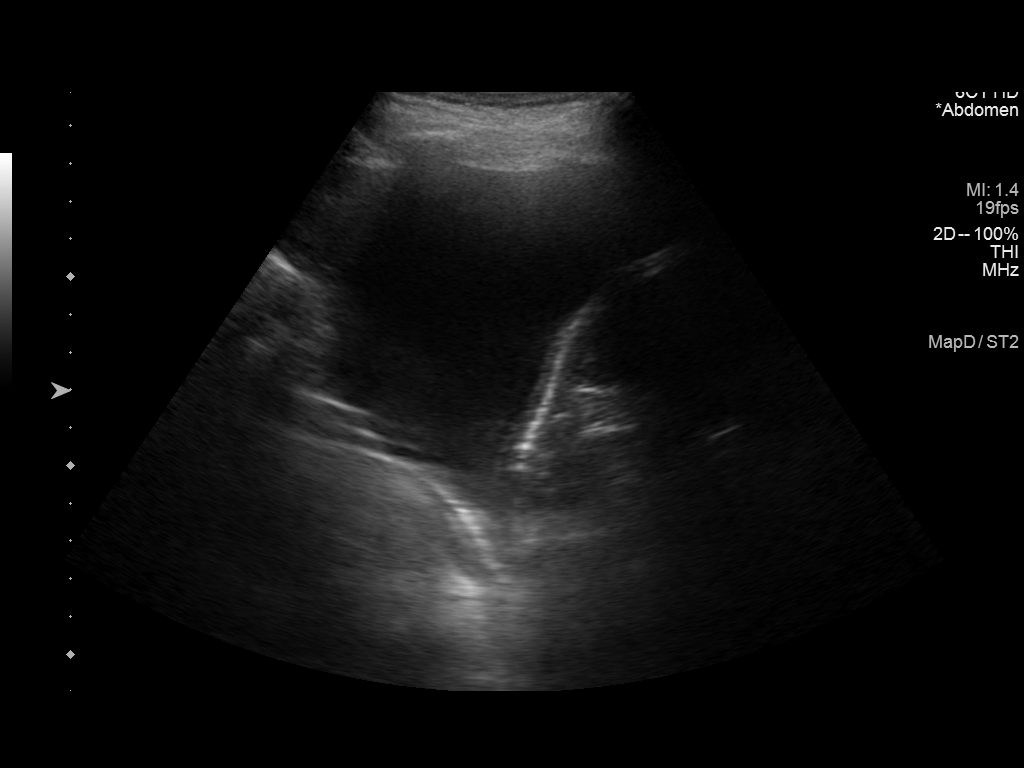
[im 2/3]
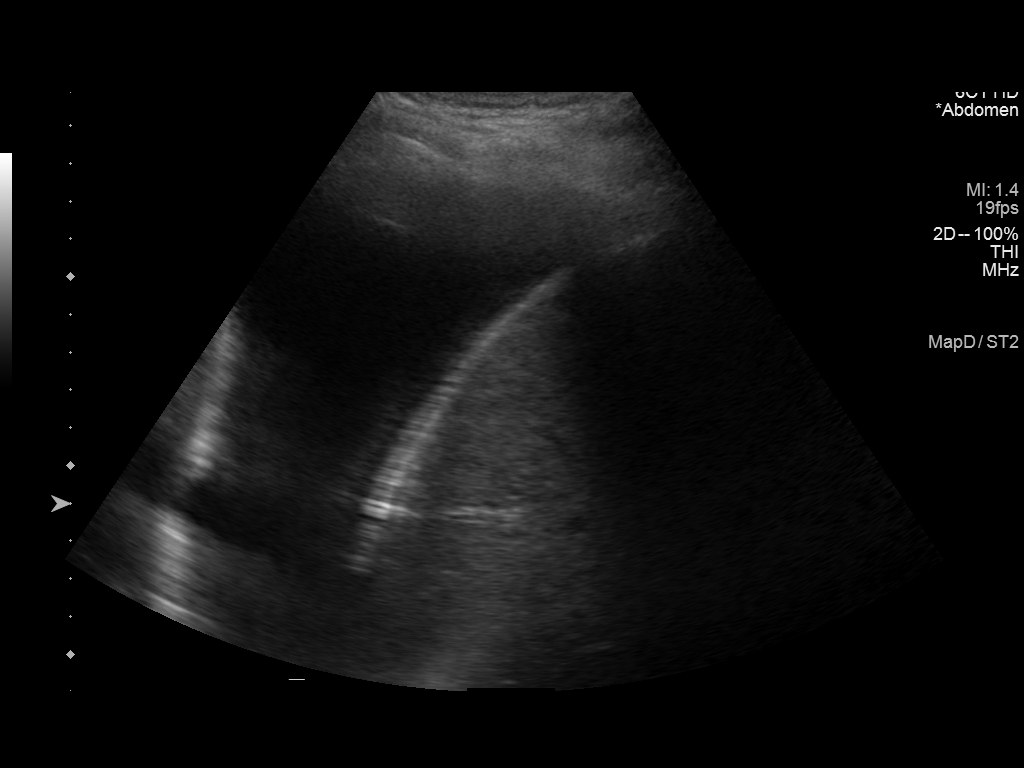
[im 3/3]
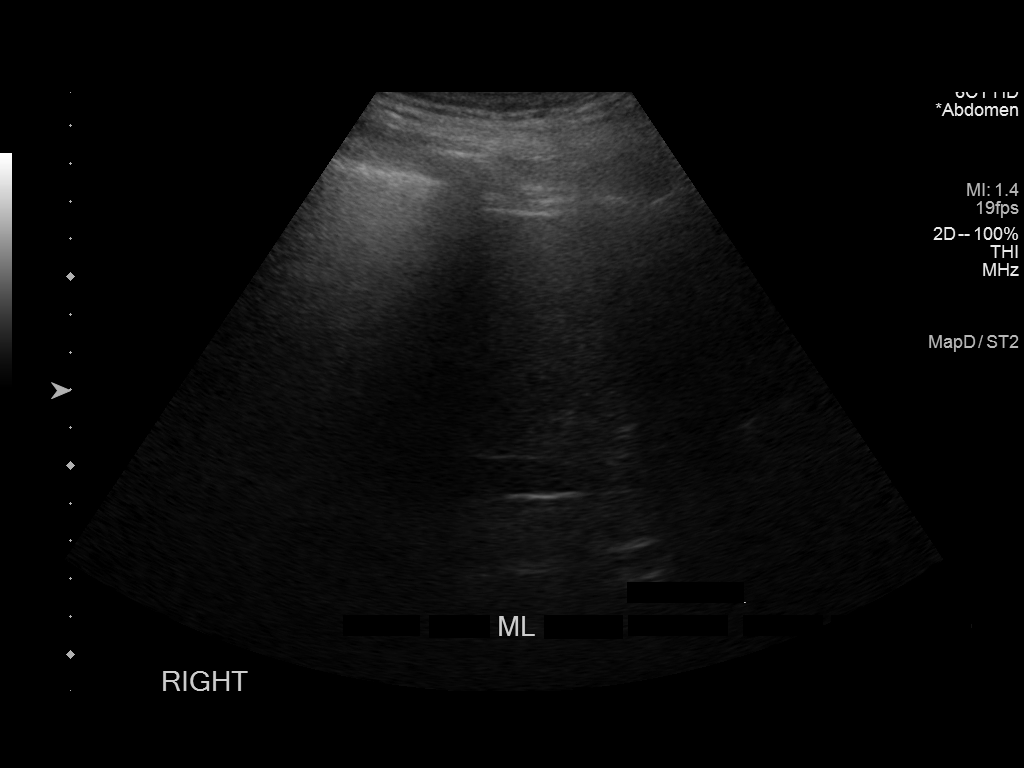

[3 of 3 positions shown; findings below may reference images not displayed]

IMPRESSION: Technically successful ultrasound-guided right thoracentesis.
Follow-up chest radiograph pending.

## 2016-02-16 MED ORDER — SEVELAMER CARBONATE 800 MG PO TABS
800.0000 mg | ORAL_TABLET | Freq: Three times a day (TID) | ORAL | Status: DC
  Administered 2016-02-16 – 2016-02-19 (×9): 800 mg via ORAL
  Filled 2016-02-16 (×9): qty 1

## 2016-02-16 MED ORDER — PROMETHAZINE HCL 25 MG PO TABS
25.0000 mg | ORAL_TABLET | Freq: Four times a day (QID) | ORAL | Status: DC | PRN
Start: 2016-02-16 — End: 2016-02-19
  Administered 2016-02-16 – 2016-02-18 (×6): 25 mg via ORAL
  Filled 2016-02-16 (×6): qty 1

## 2016-02-16 MED ORDER — CLOPIDOGREL BISULFATE 75 MG PO TABS
75.0000 mg | ORAL_TABLET | Freq: Every day | ORAL | Status: DC
  Administered 2016-02-16 – 2016-02-19 (×4): 75 mg via ORAL
  Filled 2016-02-16 (×4): qty 1

## 2016-02-16 MED ORDER — HYDROCODONE-ACETAMINOPHEN 5-325 MG PO TABS
1.0000 | ORAL_TABLET | Freq: Four times a day (QID) | ORAL | Status: DC | PRN
  Administered 2016-02-16 – 2016-02-19 (×7): 1 via ORAL
  Filled 2016-02-16 (×8): qty 1

## 2016-02-16 MED ORDER — SENNOSIDES-DOCUSATE SODIUM 8.6-50 MG PO TABS: 1.0000 | ORAL_TABLET | Freq: Every evening | ORAL | Status: DC | PRN

## 2016-02-16 MED ORDER — ACETAMINOPHEN 325 MG PO TABS
650.0000 mg | ORAL_TABLET | Freq: Four times a day (QID) | ORAL | Status: DC | PRN
  Administered 2016-02-17 – 2016-02-19 (×2): 650 mg via ORAL
  Filled 2016-02-16 (×2): qty 2

## 2016-02-16 MED ORDER — DIATRIZOATE MEGLUMINE & SODIUM 66-10 % PO SOLN
15.0000 mL | Freq: Once | ORAL | Status: AC
  Administered 2016-02-16: 15 mL via ORAL

## 2016-02-16 MED ORDER — HEPARIN SODIUM (PORCINE) 5000 UNIT/ML IJ SOLN
5000.0000 [IU] | Freq: Three times a day (TID) | INTRAMUSCULAR | Status: DC
Start: 2016-02-16 — End: 2016-02-19
  Administered 2016-02-16 – 2016-02-19 (×7): 5000 [IU] via SUBCUTANEOUS
  Filled 2016-02-16 (×7): qty 1

## 2016-02-16 MED ORDER — LORATADINE 10 MG PO TABS
10.0000 mg | ORAL_TABLET | Freq: Every day | ORAL | Status: DC
Start: 2016-02-16 — End: 2016-02-19
  Administered 2016-02-17 – 2016-02-19 (×3): 10 mg via ORAL
  Filled 2016-02-16 (×3): qty 1

## 2016-02-16 MED ORDER — IOPAMIDOL (ISOVUE-300) INJECTION 61%
100.0000 mL | Freq: Once | INTRAVENOUS | Status: AC | PRN
  Administered 2016-02-16: 100 mL via INTRAVENOUS

## 2016-02-16 MED ORDER — ALBUTEROL SULFATE (2.5 MG/3ML) 0.083% IN NEBU: 2.5000 mg | INHALATION_SOLUTION | RESPIRATORY_TRACT | Status: DC | PRN

## 2016-02-16 MED ORDER — RANOLAZINE ER 500 MG PO TB12
1000.0000 mg | ORAL_TABLET | Freq: Two times a day (BID) | ORAL | Status: DC
  Administered 2016-02-16 – 2016-02-19 (×6): 1000 mg via ORAL
  Filled 2016-02-16 (×6): qty 2

## 2016-02-16 MED ORDER — ATORVASTATIN CALCIUM 20 MG PO TABS
20.0000 mg | ORAL_TABLET | Freq: Every evening | ORAL | Status: DC
  Administered 2016-02-16 – 2016-02-19 (×4): 20 mg via ORAL
  Filled 2016-02-16 (×4): qty 1

## 2016-02-16 MED ORDER — ENOXAPARIN SODIUM 40 MG/0.4ML ~~LOC~~ SOLN: 40.0000 mg | SUBCUTANEOUS | Status: DC

## 2016-02-16 MED ORDER — FAMOTIDINE 20 MG PO TABS
20.0000 mg | ORAL_TABLET | Freq: Every day | ORAL | Status: DC
  Administered 2016-02-16: 20 mg via ORAL
  Filled 2016-02-16: qty 1

## 2016-02-16 MED ORDER — INSULIN ASPART 100 UNIT/ML ~~LOC~~ SOLN
0.0000 [IU] | Freq: Three times a day (TID) | SUBCUTANEOUS | Status: DC
  Administered 2016-02-17 – 2016-02-19 (×2): 1 [IU] via SUBCUTANEOUS
  Filled 2016-02-16 (×3): qty 1

## 2016-02-16 MED ORDER — INSULIN ASPART 100 UNIT/ML ~~LOC~~ SOLN
0.0000 [IU] | Freq: Every day | SUBCUTANEOUS | Status: DC
  Administered 2016-02-17: 2 [IU] via SUBCUTANEOUS
  Filled 2016-02-16: qty 2

## 2016-02-16 MED ORDER — PANCRELIPASE (LIP-PROT-AMYL) 12000-38000 UNITS PO CPEP
12000.0000 [IU] | ORAL_CAPSULE | Freq: Three times a day (TID) | ORAL | Status: DC
  Administered 2016-02-16 – 2016-02-19 (×9): 12000 [IU] via ORAL
  Filled 2016-02-16 (×9): qty 1

## 2016-02-16 MED ORDER — PROMETHAZINE HCL 25 MG PO TABS
25.0000 mg | ORAL_TABLET | Freq: Once | ORAL | Status: AC
  Administered 2016-02-16: 25 mg via ORAL
  Filled 2016-02-16: qty 1

## 2016-02-16 MED ORDER — ASPIRIN EC 81 MG PO TBEC
81.0000 mg | DELAYED_RELEASE_TABLET | ORAL | Status: DC
  Administered 2016-02-17 – 2016-02-19 (×3): 81 mg via ORAL
  Filled 2016-02-16 (×3): qty 1

## 2016-02-16 MED ORDER — INSULIN ASPART 100 UNIT/ML ~~LOC~~ SOLN: 0.0000 [IU] | Freq: Three times a day (TID) | SUBCUTANEOUS | Status: DC

## 2016-02-16 MED ORDER — HYDROMORPHONE HCL 1 MG/ML IJ SOLN
1.0000 mg | Freq: Once | INTRAMUSCULAR | Status: AC
  Administered 2016-02-16: 1 mg via INTRAVENOUS

## 2016-02-16 MED ORDER — NITROGLYCERIN 0.4 MG SL SUBL: 0.4000 mg | SUBLINGUAL_TABLET | SUBLINGUAL | Status: DC | PRN

## 2016-02-16 MED ORDER — HYDROMORPHONE HCL 1 MG/ML IJ SOLN
1.0000 mg | Freq: Once | INTRAMUSCULAR | Status: AC
  Administered 2016-02-16: 1 mg via INTRAVENOUS
  Filled 2016-02-16: qty 1

## 2016-02-16 MED ORDER — AMLODIPINE BESYLATE 10 MG PO TABS
10.0000 mg | ORAL_TABLET | Freq: Every day | ORAL | Status: DC
  Administered 2016-02-16 – 2016-02-19 (×3): 10 mg via ORAL
  Filled 2016-02-16 (×2): qty 2
  Filled 2016-02-16: qty 1
  Filled 2016-02-16: qty 2

## 2016-02-16 MED ORDER — HYDRALAZINE HCL 25 MG PO TABS
25.0000 mg | ORAL_TABLET | Freq: Three times a day (TID) | ORAL | Status: DC
  Administered 2016-02-16 – 2016-02-19 (×7): 25 mg via ORAL
  Filled 2016-02-16 (×10): qty 1

## 2016-02-16 MED ORDER — DEXTROSE 50 % IV SOLN
25.0000 mL | Freq: Once | INTRAVENOUS | Status: AC
  Administered 2016-02-16: 25 mL via INTRAVENOUS
  Filled 2016-02-16: qty 50

## 2016-02-16 MED ORDER — VITAMIN B-12 1000 MCG PO TABS
1000.0000 ug | ORAL_TABLET | Freq: Every day | ORAL | Status: DC
  Administered 2016-02-16 – 2016-02-19 (×4): 1000 ug via ORAL
  Filled 2016-02-16 (×4): qty 1

## 2016-02-16 MED ORDER — METOCLOPRAMIDE HCL 10 MG PO TABS
10.0000 mg | ORAL_TABLET | Freq: Three times a day (TID) | ORAL | Status: DC
  Administered 2016-02-16 – 2016-02-19 (×9): 10 mg via ORAL
  Filled 2016-02-16 (×10): qty 1

## 2016-02-16 MED ORDER — ACETAMINOPHEN 650 MG RE SUPP
650.0000 mg | Freq: Four times a day (QID) | RECTAL | Status: DC | PRN
Start: 2016-02-16 — End: 2016-02-19

## 2016-02-16 MED ORDER — CITALOPRAM HYDROBROMIDE 20 MG PO TABS
20.0000 mg | ORAL_TABLET | ORAL | Status: DC
  Administered 2016-02-17 – 2016-02-19 (×3): 20 mg via ORAL
  Filled 2016-02-16 (×3): qty 1

## 2016-02-16 MED ORDER — RISAQUAD PO CAPS
1.0000 | ORAL_CAPSULE | Freq: Every day | ORAL | Status: DC
  Administered 2016-02-16 – 2016-02-19 (×4): 1 via ORAL
  Filled 2016-02-16 (×4): qty 1

## 2016-02-16 MED ORDER — RENA-VITE PO TABS
1.0000 | ORAL_TABLET | Freq: Every day | ORAL | Status: DC
  Administered 2016-02-16 – 2016-02-19 (×4): 1 via ORAL
  Filled 2016-02-16 (×4): qty 1

## 2016-02-16 MED ORDER — SODIUM CHLORIDE 0.9 % IV BOLUS (SEPSIS)
1000.0000 mL | Freq: Once | INTRAVENOUS | Status: AC
  Administered 2016-02-16: 1000 mL via INTRAVENOUS

## 2016-02-16 MED ORDER — OMEGA-3-ACID ETHYL ESTERS 1 G PO CAPS
1.0000 g | ORAL_CAPSULE | Freq: Every day | ORAL | Status: DC
  Administered 2016-02-16 – 2016-02-19 (×4): 1 g via ORAL
  Filled 2016-02-16 (×4): qty 1

## 2016-02-16 NOTE — ED Notes (Signed)
Dr. Reita Cliche notified of patient's blood sugar.  1/2 amp D50 given at this time.  Patient continues to c/o pain and requests pain and nausea medication.

## 2016-02-16 NOTE — ED Notes (Signed)
Patient's son called stating his mother cannot take Dilaudid because it causes delirium. Informed him that it has been noted on her chart.

## 2016-02-16 NOTE — Progress Notes (Signed)
Patient takes Zantac 150mg  once every evening and it is not ordered.  Dr. Fritzi Mandes is on call for hospitalists and gave permission to order.  Therapeutic substitution was ordered.

## 2016-02-16 NOTE — ED Notes (Signed)
Pt arrived via EMS from home.  Pt c/o upper abd pain under breast and throughout abdomen as well.  Pain started yesterday per patient and worsened today.  Pt is a dialysis patient and goes to treatment on MWF.  Pt is a diabetic and was recently taken off her medications due to hypoglycemia.  Pt CBG was 65 with EMS.

## 2016-02-16 NOTE — ED Notes (Signed)
MD at bedside. 

## 2016-02-16 NOTE — Progress Notes (Signed)
Anticoagulation Monitoring  Patient is a 55 yo female admitted for abdominal pain.  Patient has orders for Lovenox 40 mg subq q24h.  Patient is ESRD requiring HD on MWF.  Per anticoagulation policy, will transition patient to heparin 5000 units subq q8h.   Pharmacy will continue to monitor per policy.  Murrell Converse, PharmD Clinical Pharmacist 02/16/2016

## 2016-02-16 NOTE — ED Provider Notes (Signed)
[facility] Emergency Department Provider Note   ____________________________________________  Time seen:  I have reviewed the triage vital signs and the triage nursing note.  HISTORY  Chief Complaint Abdominal Pain   Historian Patient  HPI [name] is a 55 y.o. female with a history of diabetes, gastroparesis, and end-stage renal disease on dialysis Monday-Wednesday-Friday is here for evaluation of upper abdominal pain. She states that although she has a history of frequent recurrent gastroparesis and takes Norco at home for chronic back pain, this pain started yesterday and was worsening overnight and so she came in for evaluation. She states that this hurts all the way around her ribs and when she takes a deep breath. She states that she did not do anything that should've injured herself or pulled muscle. Nausea without vomiting. No diarrhea. No lower abdominal pain. No fever. No black or bloody stools.  She states she has a history of pancreatitis, and a history of gastroparesis. States that this does not feel like atypical out with gastroparesis.    Past Medical History  Diagnosis Date  . Asthma   . Collagen vascular disease ([place])   . Diabetes mellitus without complication ([place])   . Hypertension   . Coronary artery disease     a. cath 2013: stenting to RCA (report not available); b. cath 2014: LM nl, pLAD 40%, mLAD nl, ost LCx 40%, mid LCx nl, pRCA 30% @ site of prior stent, mRCA 50%  . Diabetic neuropathy ([place])   . ESRD (end stage renal disease) on dialysis ([place])     M-W-F  . GERD (gastroesophageal reflux disease)   . Hx of pancreatitis 2015  . Myocardial infarction ([place])   . Pneumonia   . Mitral regurgitation     a. echo 10/2013: EF 62%, noWMA, mildly dilated LA, mild to mod MR/TR, GR1DD  . COPD (chronic obstructive pulmonary disease) ([place])   . chronic diastolic CHF [id number]  . dialysis 2006  . Depression   . Anxiety   . Arthritis   .  Headache   . Anemia   . Renal insufficiency     Patient Active Problem List   Diagnosis Date Noted  . Malnutrition of moderate degree 12/01/2015  . Renal mass   . Dyspnea   . Acute renal failure ([place])   . Respiratory failure ([place])   . High temperature 11/14/2015  . Pulmonary edema   . Encounter for central line placement   . Encounter for orogastric (OG) tube placement   . Nausea 11/12/2015  . Hyperkalemia 10/03/2015  . C. difficile diarrhea 07/22/2015  . Pneumonia 05/21/2015  . Hypoglycemia 04/24/2015  . Unresponsiveness 04/24/2015  . Bradycardia 04/24/2015  . Hypothermia 04/24/2015  . Acute respiratory failure ([place]) 04/24/2015  . Acute diastolic CHF (congestive heart failure) ([place]) 04/05/2015  . Diabetic gastroparesis ([place]) 04/05/2015  . Hypokalemia 04/05/2015  . Generalized weakness 04/05/2015  . Acute pulmonary edema ([place]) 04/03/2015  . Nausea and vomiting 04/03/2015  . Hypoglycemia associated with diabetes ([place]) 04/03/2015  . Anemia of chronic disease 04/03/2015  . Secondary hyperparathyroidism ([place]) 04/03/2015  . Pressure ulcer 04/02/2015  . Acute respiratory failure with hypoxia ([place]) 04/01/2015  . Adjustment disorder with anxiety 03/14/2015  . Somatic symptom disorder, mild 03/08/2015  . Coronary artery disease involving native coronary artery of native heart without angina pectoris   . Nausea & vomiting 03/06/2015  . Abdominal pain 03/06/2015  . DM (diabetes mellitus) ([place]) 03/06/2015  . HTN (hypertension) 03/06/2015  .  Gastroparesis 02/24/2015  . Pleural effusion 02/19/2015  . HCAP (healthcare-associated pneumonia) 02/19/2015  . End-stage renal disease on hemodialysis ([place]) 02/19/2015    Past Surgical History  Procedure Laterality Date  . Cholecystectomy    . Appendectomy    . Abdominal hysterectomy    . Dialysis fistula creation Left     upper arm  . Esophagogastroduodenoscopy N/A 03/08/2015    Procedure: ESOPHAGOGASTRODUODENOSCOPY (EGD);  Surgeon:  Manya Silvas, MD;  Location: [facility] ENDOSCOPY;  Service: Endoscopy;  Laterality: N/A;  . Cardiac catheterization Left 07/26/2015    Procedure: Left Heart Cath and Coronary Angiography;  Surgeon: Dionisio David, MD;  Location: [place] CV LAB;  Service: Cardiovascular;  Laterality: Left;  Marland Kitchen Eye surgery    . Fecal transplant N/A 08/23/2015    Procedure: FECAL TRANSPLANT;  Surgeon: Manya Silvas, MD;  Location: [facility] ENDOSCOPY;  Service: Endoscopy;  Laterality: N/A;  . Peripheral vascular catheterization N/A 12/20/2015    Procedure: Thrombectomy of dialysis access versus permcath placement;  Surgeon: Algernon Huxley, MD;  Location: [place] CV LAB;  Service: Cardiovascular;  Laterality: N/A;  . Peripheral vascular catheterization N/A 12/20/2015    Procedure: A/V Shunt Intervention;  Surgeon: Algernon Huxley, MD;  Location: [place] CV LAB;  Service: Cardiovascular;  Laterality: N/A;  . Peripheral vascular catheterization N/A 12/20/2015    Procedure: A/V Shuntogram/Fistulagram;  Surgeon: Algernon Huxley, MD;  Location: [place] CV LAB;  Service: Cardiovascular;  Laterality: N/A;  . Peripheral vascular catheterization N/A 01/02/2016    Procedure: A/V Shuntogram/Fistulagram;  Surgeon: Algernon Huxley, MD;  Location: [place] CV LAB;  Service: Cardiovascular;  Laterality: N/A;  . Peripheral vascular catheterization N/A 01/02/2016    Procedure: A/V Shunt Intervention;  Surgeon: Algernon Huxley, MD;  Location: [place] CV LAB;  Service: Cardiovascular;  Laterality: N/A;    No current outpatient prescriptions on file.  Allergies Compazine; Ace inhibitors; Ativan; Codeine; Dilaudid; Gabapentin; Losartan; Ondansetron; Prochlorperazine; Scopolamine; Zofran; Oxycodone; and Tape  Family History  Problem Relation Age of Onset  . Kidney disease Mother   . Diabetes Mother     Social History Social History  Substance Use Topics  . Smoking status: Former Smoker -- 0.50 packs/day    Types:  Cigarettes    Quit date: 02/13/2015  . Smokeless tobacco: Never Used  . Alcohol Use: No    Review of Systems  Constitutional: Negative for fever. Eyes: Negative for visual changes. ENT: Negative for sore throat. Cardiovascular: Negative for chest pain. Respiratory: Negative for shortness of breath. Gastrointestinal: As per history of present illness. Genitourinary: Negative for dysuria. Musculoskeletal: Positive for chronic back pain.. Skin: Negative for rash. Neurological: Negative for headache. 10 point Review of Systems otherwise negative ____________________________________________   PHYSICAL EXAM:  VITAL SIGNS: ED Triage Vitals  Enc Vitals Group     BP 02/16/16 0830 146/74 mmHg     Pulse Rate 02/16/16 0830 82     Resp 02/16/16 0830 18     Temp --      Temp src --      SpO2 02/16/16 0830 100 %     Weight --      Height --      Head Cir --      Peak Flow --      Pain Score 02/16/16 0807 10     Pain Loc --      Pain Edu? --      Excl. in [place]? --  Constitutional: Alert and oriented. Well appearing and in no distress. HEENT   Head: Normocephalic and atraumatic.      Eyes: Conjunctivae are normal. PERRL. Normal extraocular movements.      Ears:         Nose: No congestion/rhinnorhea.   Mouth/Throat: Mucous membranes are moist.   Neck: No stridor. Cardiovascular/Chest: Normal rate, regular rhythm.  No murmurs, rubs, or gallops. Respiratory: Normal respiratory effort without tachypnea nor retractions. Breath sounds are clear and equal bilaterally. No wheezes/rales/rhonchi. Gastrointestinal: Soft. No distention, no guarding, no rebound. Epigastric and mid abdominal pain. No focal right upper quadrant tenderness. No pelvic right or left lower quadrant tenderness to palpation.  Genitourinary/rectal:Deferred Musculoskeletal: Nontender with normal range of motion in all extremities. No joint effusions.  No lower extremity tenderness.  No edema. Neurologic:   Normal speech and language. No gross or focal neurologic deficits are appreciated. Skin:  Skin is warm, dry and intact. No rash noted. Psychiatric: No hallucinations  ____________________________________________   EKG I, Lisa Roca, MD, the attending physician have personally viewed and interpreted all ECGs.  87 bpm. Normal sinus rhythm. Occasional PVC. Normal axis. Nonspecific intraventricular conduction delay. Nonspecific T wave. ____________________________________________  LABS (pertinent positives/negatives)  Labs Reviewed  LIPASE, BLOOD - Abnormal; Notable for the following:    Lipase 58 (*)    All other components within normal limits  COMPREHENSIVE METABOLIC PANEL - Abnormal; Notable for the following:    Chloride 97 (*)    Creatinine, Ser 3.54 (*)    Calcium 8.8 (*)    Albumin 3.1 (*)    GFR calc non Af Amer 13 (*)    GFR calc Af Amer 16 (*)    All other components within normal limits  CBC - Abnormal; Notable for the following:    Hemoglobin 11.6 (*)    MCHC 31.8 (*)    RDW 21.2 (*)    All other components within normal limits  GLUCOSE, CAPILLARY - Abnormal; Notable for the following:    Glucose-Capillary 103 (*)    All other components within normal limits  TROPONIN I - Abnormal; Notable for the following:    Troponin I 0.05 (*)    All other components within normal limits  COMPREHENSIVE METABOLIC PANEL - Abnormal; Notable for the following:    Sodium 132 (*)    Potassium 5.2 (*)    Chloride 96 (*)    BUN 26 (*)    Creatinine, Ser 4.62 (*)    Calcium 8.8 (*)    Albumin 2.9 (*)    ALT 11 (*)    GFR calc non Af Amer 10 (*)    GFR calc Af Amer 11 (*)    All other components within normal limits  CBC - Abnormal; Notable for the following:    Hemoglobin 11.5 (*)    MCHC 31.5 (*)    RDW 21.5 (*)    All other components within normal limits  GLUCOSE, CAPILLARY - Abnormal; Notable for the following:    Glucose-Capillary 122 (*)    All other components  within normal limits  GLUCOSE, CAPILLARY - Abnormal; Notable for the following:    Glucose-Capillary 64 (*)    All other components within normal limits  MRSA PCR SCREENING  GLUCOSE, CAPILLARY  GLUCOSE, CAPILLARY    ____________________________________________  RADIOLOGY All Xrays were viewed by me. Imaging interpreted by Radiologist.  Chest portable:IMPRESSION: Mild bibasilar opacities are noted consistent with edema or atelectasis with associated pleural effusions, right greater than  Left.  CT abd and pelvis with contrast:  IMPRESSION: 1. Diffuse anasarca. 2. Loculated bilateral small pleural effusions, right greater than left. 3. No bowel obstruction. 4. Previous demonstrated small enhancing right renal masses are not appreciated on the current examination and may be secondary to the phase of imaging. Consider nonemergent abdomen MRI without contrast for further evaluation. __________________________________________  PROCEDURES  Procedure(s) performed: None  Critical Care performed: None  ____________________________________________   ED COURSE / ASSESSMENT AND PLAN  Pertinent labs & imaging results that were available during my care of the patient were reviewed by me and considered in my medical decision making (see chart for details).  This patient has a history of chronic back pain and frequent recurrent episodes with gastroparesis is here with abdominal pain. Location it seems similar that it could be similar to her chronic pain, but the patient is adamant that this feels different.  Her laboratory studies are reassuring in terms of no elevated white blood cell count, hemoglobin 11.  Her lipase is a bit elevated without elevated LFTs. She does have a history of otitis. She will had a CT scan back in February and initially discussed with her not obtaining CT at this point time.  After Dr.Mody, hospitalist, evaluated the patient, she discussed with the patient  to go ahead and obtain the CT the abdomen and pelvis to rule out any additional emergency cause given how tender the patient is, and minimal improvement with multiple rounds of Dilaudid.    CONSULTATIONS: Hospitalist for admission.   Patient / Family / Caregiver informed of clinical course, medical decision-making process, and agree with plan.     ___________________________________________   FINAL CLINICAL IMPRESSION(S) / ED DIAGNOSES   Final diagnoses:  Idiopathic acute pancreatitis, unspecified complication status              Note: This dictation was prepared with Dragon dictation. Any transcriptional errors that result from this process are unintentional   Lisa Roca, MD 02/17/16 1123

## 2016-02-16 NOTE — H&P (Signed)
[facility] NAME: [name]    MR#:  [id number]  DATE OF BIRTH:  1961-01-23  DATE OF ADMISSION:  02/16/2016  PRIMARY CARE PHYSICIAN: Ellamae Sia, MD   REQUESTING/REFERRING PHYSICIAN: Dr Reita Cliche  CHIEF COMPLAINT:   Abdominal pain HISTORY OF PRESENT ILLNESS:  [name]  is a 55 y.o. female with a known history of ESRD on dialysis Monday, Wednesday and Friday, diabetes and asthma who presents with abdominal pain. Patient reports since Wednesday she's had increasing abdominal pain to the point that now it is very tender to touch. She reports that when she takes a deep breath then she has intense abdominal pain. She denies dark colored stools. She denies shortness of breath, nausea, vomiting or fevers. Her abdominal pain is generalized although worse in the upper epigastric and right upper quadrant region. It is currently 8/10. She was most recently hospitalized at the end of March for right upper quad abdominal pain however this was associated with nausea and vomiting. She feels that this abdominal pain is different than previous episodes of abdominal pain.  PAST MEDICAL HISTORY:   Past Medical History  Diagnosis Date  . Asthma   . Collagen vascular disease ([place])   . Diabetes mellitus without complication ([place])   . Hypertension   . Coronary artery disease     a. cath 2013: stenting to RCA (report not available); b. cath 2014: LM nl, pLAD 40%, mLAD nl, ost LCx 40%, mid LCx nl, pRCA 30% @ site of prior stent, mRCA 50%  . Diabetic neuropathy ([place])   . ESRD (end stage renal disease) on dialysis ([place])     M-W-F  . GERD (gastroesophageal reflux disease)   . Hx of pancreatitis 2015  . Myocardial infarction ([place])   . Pneumonia   . Mitral regurgitation     a. echo 10/2013: EF 62%, noWMA, mildly dilated LA, mild to mod MR/TR, GR1DD  . COPD (chronic obstructive pulmonary disease) ([place])   . chronic diastolic CHF [id number]  . dialysis 2006  .  Depression   . Anxiety   . Arthritis   . Headache   . Anemia   . Renal insufficiency     PAST SURGICAL HISTORY:   Past Surgical History  Procedure Laterality Date  . Cholecystectomy    . Appendectomy    . Abdominal hysterectomy    . Dialysis fistula creation Left     upper arm  . Esophagogastroduodenoscopy N/A 03/08/2015    Procedure: ESOPHAGOGASTRODUODENOSCOPY (EGD);  Surgeon: Manya Silvas, MD;  Location: [facility] ENDOSCOPY;  Service: Endoscopy;  Laterality: N/A;  . Cardiac catheterization Left 07/26/2015    Procedure: Left Heart Cath and Coronary Angiography;  Surgeon: Dionisio David, MD;  Location: [facility] CV LAB;  Service: Cardiovascular;  Laterality: Left;  Marland Kitchen Eye surgery    . Fecal transplant N/A 08/23/2015    Procedure: FECAL TRANSPLANT;  Surgeon: Manya Silvas, MD;  Location: [facility] ENDOSCOPY;  Service: Endoscopy;  Laterality: N/A;  . Peripheral vascular catheterization N/A 12/20/2015    Procedure: Thrombectomy of dialysis access versus permcath placement;  Surgeon: Algernon Huxley, MD;  Location: [facility] LAB;  Service: Cardiovascular;  Laterality: N/A;  . Peripheral vascular catheterization N/A 12/20/2015    Procedure: A/V Shunt Intervention;  Surgeon: Algernon Huxley, MD;  Location: [facility] LAB;  Service: Cardiovascular;  Laterality: N/A;  . Peripheral vascular catheterization N/A 12/20/2015    Procedure: A/V Shuntogram/Fistulagram;  Surgeon: Algernon Huxley, MD;  Location: [facility] LAB;  Service: Cardiovascular;  Laterality: N/A;  . Peripheral vascular catheterization N/A 01/02/2016    Procedure: A/V Shuntogram/Fistulagram;  Surgeon: Algernon Huxley, MD;  Location: [facility] LAB;  Service: Cardiovascular;  Laterality: N/A;  . Peripheral vascular catheterization N/A 01/02/2016    Procedure: A/V Shunt Intervention;  Surgeon: Algernon Huxley, MD;  Location: [facility];  Service: Cardiovascular;  Laterality: N/A;    SOCIAL HISTORY:   Social History   Substance Use Topics  . Smoking status: Former Smoker -- 0.50 packs/day    Types: Cigarettes    Quit date: 02/13/2015  . Smokeless tobacco: Never Used  . Alcohol Use: No    FAMILY HISTORY:   Family History  Problem Relation Age of Onset  . Kidney disease Mother   . Diabetes Mother     DRUG ALLERGIES:   Allergies  Allergen Reactions  . Compazine [Prochlorperazine Edisylate] Anaphylaxis and Nausea And Vomiting  . Ace Inhibitors Swelling  . Ativan [Lorazepam] Other (See Comments)    Reaction:  Hallucinations and headaches  . Codeine Nausea And Vomiting  . Gabapentin Other (See Comments)    Reaction:  Unknown   . Losartan Other (See Comments)    Reaction:  Unknown   . Ondansetron Other (See Comments)    Reaction:  Unknown   . Prochlorperazine Other (See Comments)    Reaction:  Unknown   . Scopolamine Other (See Comments)    Reaction:  Unknown   . Zofran [Ondansetron Hcl] Other (See Comments)    Reaction:  hallucinations   . Oxycodone Anxiety  . Tape Rash    REVIEW OF SYSTEMS:   Review of Systems  Constitutional: Negative for fever, chills and malaise/fatigue.  HENT: Negative for ear discharge, ear pain, hearing loss, nosebleeds and sore throat.   Eyes: Negative for blurred vision and pain.  Respiratory: Negative for cough, hemoptysis, shortness of breath and wheezing.   Cardiovascular: Negative for chest pain, palpitations and leg swelling.  Gastrointestinal: Positive for abdominal pain. Negative for nausea, vomiting, diarrhea and blood in stool.  Genitourinary: Negative for dysuria.  Musculoskeletal: Negative for back pain.  Neurological: Negative for dizziness, tremors, speech change, focal weakness, seizures and headaches.  Endo/Heme/Allergies: Does not bruise/bleed easily.  Psychiatric/Behavioral: Negative for depression, suicidal ideas and hallucinations.    MEDICATIONS AT HOME:   Prior to Admission medications   Medication Sig Start Date End Date  Taking? Authorizing Provider  acidophilus (RISAQUAD) CAPS capsule Take 1 capsule by mouth daily.   Yes Historical Provider, MD  albuterol (PROVENTIL) (2.5 MG/3ML) 0.083% nebulizer solution Take 2.5 mg by nebulization every 4 (four) hours as needed for wheezing or shortness of breath.   Yes Historical Provider, MD  amLODipine (NORVASC) 10 MG tablet Take 10 mg by mouth daily.   Yes Historical Provider, MD  aspirin EC 81 MG tablet Take 81 mg by mouth every morning.    Yes Historical Provider, MD  atorvastatin (LIPITOR) 20 MG tablet Take 20 mg by mouth every evening.   Yes Historical Provider, MD  cetirizine (ZYRTEC) 10 MG tablet Take 10 mg by mouth daily.   Yes Historical Provider, MD  citalopram (CELEXA) 20 MG tablet Take 20 mg by mouth every morning.    Yes Historical Provider, MD  clopidogrel (PLAVIX) 75 MG tablet Take 75 mg by mouth daily. Reported on 12/17/2015   Yes Historical Provider, MD  hydrALAZINE (APRESOLINE) 25 MG tablet Take 1  tablet (25 mg total) by mouth every 8 (eight) hours. Patient taking differently: Take 25 mg by mouth 3 (three) times daily.  12/21/15  Yes Dustin Flock, MD  HYDROcodone-acetaminophen (NORCO/VICODIN) 5-325 MG tablet Take 1 tablet by mouth every 6 (six) hours as needed for moderate pain or severe pain.   Yes Historical Provider, MD  lipase/protease/amylase (CREON) 12000 UNITS CPEP capsule Take 12,000 Units by mouth 3 (three) times daily with meals.    Yes Historical Provider, MD  metoCLOPramide (REGLAN) 10 MG tablet Take 1 tablet (10 mg total) by mouth 3 (three) times daily before meals. 12/21/15  Yes Dustin Flock, MD  multivitamin (RENA-VIT) TABS tablet Take 1 tablet by mouth daily.   Yes Historical Provider, MD  nitroGLYCERIN (NITROSTAT) 0.4 MG SL tablet Place 0.4 mg under the tongue every 5 (five) minutes x 3 doses as needed for chest pain. *Max 3 doses per episode*   Yes Historical Provider, MD  omega-3 acid ethyl esters (LOVAZA) 1 g capsule Take 1 g by mouth  daily.   Yes Historical Provider, MD  promethazine (PHENERGAN) 25 MG tablet Take 25 mg by mouth every 6 (six) hours as needed for nausea or vomiting.   Yes Historical Provider, MD  ranitidine (ZANTAC) 150 MG tablet Take 150 mg by mouth every evening.    Yes Historical Provider, MD  ranolazine (RANEXA) 1000 MG SR tablet Take 1,000 mg by mouth 2 (two) times daily.   Yes Historical Provider, MD  sevelamer carbonate (RENVELA) 800 MG tablet Take 800 mg by mouth 3 (three) times daily with meals.    Yes Historical Provider, MD  vitamin B-12 (CYANOCOBALAMIN) 1000 MCG tablet Take 1,000 mcg by mouth daily.   Yes Historical Provider, MD  bisacodyl (DULCOLAX) 5 MG EC tablet Take 1 tablet (5 mg total) by mouth daily as needed for moderate constipation. 12/01/15   Vaughan Basta, MD  oxymetazoline (AFRIN) 0.05 % nasal spray Place 2 sprays into both nostrils 2 (two) times daily. 12/29/15   Loney Hering, MD      VITAL SIGNS:  Blood pressure 164/70, pulse 88, temperature 97.8 F (36.6 C), temperature source Oral, resp. rate 12, height 5' 5.5" (1.664 m), weight 68.221 kg (150 lb 6.4 oz), SpO2 96 %.  PHYSICAL EXAMINATION:   Physical Exam  Constitutional: She is oriented to person, place, and time and well-developed, well-nourished, and in no distress. No distress.  HENT:  Head: Normocephalic.  Eyes: No scleral icterus.  Neck: Normal range of motion. Neck supple. No JVD present. No tracheal deviation present.  Cardiovascular: Normal rate, regular rhythm and normal heart sounds.  Exam reveals no gallop and no friction rub.   No murmur heard. Pulmonary/Chest: Effort normal and breath sounds normal. No respiratory distress. She has no wheezes. She has no rales. She exhibits no tenderness.  Abdominal: Soft. Bowel sounds are normal. She exhibits no distension and no mass. There is tenderness. There is no rebound and no guarding.  Musculoskeletal: Normal range of motion. She exhibits no edema.   Neurological: She is alert and oriented to person, place, and time.  Skin: Skin is warm. No rash noted. No erythema.  Psychiatric: Affect and judgment normal.      LABORATORY PANEL:   CBC  Recent Labs Lab 02/16/16 0819  WBC 7.8  HGB 11.6*  HCT 36.6  PLT 223   ------------------------------------------------------------------------------------------------------------------  Chemistries   Recent Labs Lab 02/16/16 0819  NA 135  K 4.7  CL 97*  CO2 28  GLUCOSE 69  BUN 18  CREATININE 3.54*  CALCIUM 8.8*  AST 31  ALT 16  ALKPHOS 116  BILITOT 0.6   ------------------------------------------------------------------------------------------------------------------  Cardiac Enzymes No results for input(s): TROPONINI in the last 168 hours. ------------------------------------------------------------------------------------------------------------------  RADIOLOGY:  Dg Chest Portable 1 View  02/16/2016  CLINICAL DATA:  Shortness of breath. EXAM: PORTABLE CHEST 1 VIEW COMPARISON:  December 20, 2015. FINDINGS: Stable cardiomediastinal silhouette. No pneumothorax is noted. Mild bibasilar interstitial densities are noted with mild associated pleural effusions. Bony thorax is unremarkable. IMPRESSION: Mild bibasilar opacities are noted consistent with edema or atelectasis with associated pleural effusions, right greater than left. Electronically Signed   By: Marijo Conception, M.D.   On: 02/16/2016 08:51    EKG:     IMPRESSION AND PLAN:   55 year old female with ESRD on hemodialysis, diabetes, ASCVD who presents with abdominal pain.  1. Abdominal pain: Patient is significantly tender on examination. She reports that this abdominal pain is different than previous episodes abdominal pain. CT scan of abdomen ordered in the emergency room. Chest x-ray consistent with atelectasis, likely not the etiology of her abdominal pain. If patient continues to have abdominal pain and CT scan  is unrevealing consider GI consultation for possible EGD.  2. ESRD on hemodialysis: Consult nephrology is a patient will also be receiving IV contrast for CT scan.  3. ASCVD: Continue aspirin, atorvastatin, hydralazine and Plavix.  4. Essential hypertension: Continue Norvasc and hydralazine.  5. Diabetes with gastroparesis: Sliding scale insulin and Reglan.    All the records are reviewed and case discussed with ED provider. Management plans discussed with the patient and she in agreement  CODE STATUS: FULL  TOTAL TIME TAKING CARE OF THIS PATIENT: 45 minutes.    Roseanna Koplin M.D on 02/16/2016 at 12:17 PM  Between 7am to 6pm - Pager - [phone]  After 6pm go to [email or web address] - password [email or web address] [facility]  Office  [phone]  CC: Primary care physician; Ellamae Sia, MD

## 2016-02-17 ENCOUNTER — Inpatient Hospital Stay: Payer: Medicare Other

## 2016-02-17 LAB — COMPREHENSIVE METABOLIC PANEL
ALT: 11 U/L — ABNORMAL LOW (ref 14–54)
AST: 19 U/L (ref 15–41)
Albumin: 2.9 g/dL — ABNORMAL LOW (ref 3.5–5.0)
Alkaline Phosphatase: 111 U/L (ref 38–126)
Anion gap: 9 (ref 5–15)
BUN: 26 mg/dL — ABNORMAL HIGH (ref 6–20)
CO2: 27 mmol/L (ref 22–32)
Calcium: 8.8 mg/dL — ABNORMAL LOW (ref 8.9–10.3)
Chloride: 96 mmol/L — ABNORMAL LOW (ref 101–111)
Creatinine, Ser: 4.62 mg/dL — ABNORMAL HIGH (ref 0.44–1.00)
GFR calc Af Amer: 11 mL/min — ABNORMAL LOW (ref >60)
GFR calc non Af Amer: 10 mL/min — ABNORMAL LOW (ref >60)
Glucose, Bld: 74 mg/dL (ref 65–99)
Potassium: 5.2 mmol/L — ABNORMAL HIGH (ref 3.5–5.1)
Sodium: 132 mmol/L — ABNORMAL LOW (ref 135–145)
Total Bilirubin: 0.6 mg/dL (ref 0.3–1.2)
Total Protein: 7.3 g/dL (ref 6.5–8.1)

## 2016-02-17 LAB — CBC
HCT: 36.3 % (ref 35.0–47.0)
Hemoglobin: 11.5 g/dL — ABNORMAL LOW (ref 12.0–16.0)
MCH: 26.3 pg (ref 26.0–34.0)
MCHC: 31.5 g/dL — ABNORMAL LOW (ref 32.0–36.0)
MCV: 83.4 fL (ref 80.0–100.0)
Platelets: 242 10*3/uL (ref 150–440)
RBC: 4.36 MIL/uL (ref 3.80–5.20)
RDW: 21.5 % — ABNORMAL HIGH (ref 11.5–14.5)
WBC: 7.3 10*3/uL (ref 3.6–11.0)

## 2016-02-17 LAB — BODY FLUID CELL COUNT WITH DIFFERENTIAL
Eos, Fluid: 0 %
Lymphs, Fluid: 95 %
Monocyte-Macrophage-Serous Fluid: 2 %
Neutrophil Count, Fluid: 3 %
Other Cells, Fluid: 0 %
Total Nucleated Cell Count, Fluid: 2962 cu mm

## 2016-02-17 LAB — GLUCOSE, SEROUS FLUID: Glucose, Fluid: 86 mg/dL

## 2016-02-17 LAB — PROTEIN, BODY FLUID: Total protein, fluid: 3 g/dL

## 2016-02-17 LAB — GLUCOSE, CAPILLARY
Glucose-Capillary: 64 mg/dL — ABNORMAL LOW (ref 65–99)
Glucose-Capillary: 137 mg/dL — ABNORMAL HIGH (ref 65–99)
Glucose-Capillary: 117 mg/dL — ABNORMAL HIGH (ref 65–99)
Glucose-Capillary: 224 mg/dL — ABNORMAL HIGH (ref 65–99)

## 2016-02-17 LAB — LIPASE, BLOOD: Lipase: 19 U/L (ref 11–51)

## 2016-02-17 MED ORDER — PATIROMER SORBITEX CALCIUM 8.4 G PO PACK
8.4000 g | PACK | Freq: Every day | ORAL | Status: DC
  Administered 2016-02-17 – 2016-02-19 (×3): 8.4 g via ORAL
  Filled 2016-02-17 (×3): qty 4

## 2016-02-17 MED ORDER — PANTOPRAZOLE SODIUM 40 MG PO TBEC
40.0000 mg | DELAYED_RELEASE_TABLET | Freq: Every day | ORAL | Status: DC
  Administered 2016-02-17 – 2016-02-19 (×3): 40 mg via ORAL
  Filled 2016-02-17 (×3): qty 1

## 2016-02-17 NOTE — Progress Notes (Signed)
[facility]  ROUNDING NOTE   Subjective:  Patient well known to [place] as an outpatient. She normally undergoes hemodialysis on Monday, Wednesday, Friday. She underwent dialysis on Friday. She presents now with abdominal pain which is worsened by deep inspiration. CT scan of the abdomen and pelvis revealed diffuse anasarca, loculated bilateral small pleural effusions. The previously demonstrated small enhancing right renal masses were not appreciated on the current exam.    Objective:  Vital signs in last 24 hours:  Temp:  [98 F (36.7 C)-98.2 F (36.8 C)] 98.2 F (36.8 C) (05/28 1322) Pulse Rate:  [75-84] 75 (05/28 1324) Resp:  [16] 16 (05/28 1322) BP: (143-173)/(56-68) 168/67 mmHg (05/28 1324) SpO2:  [96 %-97 %] 96 % (05/28 1322)  Weight change:  Filed Weights   02/16/16 0820  Weight: 68.221 kg (150 lb 6.4 oz)    Intake/Output: I/O last 3 completed shifts: In: 300 [P.O.:300] Out: 0    Intake/Output this shift:  Total I/O In: 240 [P.O.:240] Out: -   Physical Exam: General: NAD, resting in bed  Head: Normocephalic, atraumatic. Moist oral mucosal membranes  Eyes: Anicteric  Neck: Supple, trachea midline  Lungs:  Diminished at bases otherwise clear  Heart: S1S2 no rubs  Abdomen:  Mild upper quadrant tenderness, no rebound, BS present  Extremities: 1+ peripheral edema  Neurologic: Nonfocal, moving all four extremities  Skin: No lesions  Access: LUE AVG    Basic Metabolic Panel:  Recent Labs Lab 02/16/16 0819 02/17/16 0747  NA 135 132*  K 4.7 5.2*  CL 97* 96*  CO2 28 27  GLUCOSE 69 74  BUN 18 26*  CREATININE 3.54* 4.62*  CALCIUM 8.8* 8.8*    Liver Function Tests:  Recent Labs Lab 02/16/16 0819 02/17/16 0747  AST 31 19  ALT 16 11*  ALKPHOS 116 111  BILITOT 0.6 0.6  PROT 7.4 7.3  ALBUMIN 3.1* 2.9*    Recent Labs Lab 02/16/16 0819 02/17/16 0747  LIPASE 58* 19   No results for input(s): AMMONIA in the last 168  hours.  CBC:  Recent Labs Lab 02/16/16 0819 02/17/16 0747  WBC 7.8 7.3  HGB 11.6* 11.5*  HCT 36.6 36.3  MCV 82.0 83.4  PLT 223 242    Cardiac Enzymes:  Recent Labs Lab 02/16/16 0819  TROPONINI 0.05*    BNP: Invalid input(s): POCBNP  CBG:  Recent Labs Lab 02/16/16 0922 02/16/16 1635 02/16/16 2130 02/17/16 0730 02/17/16 1131  GLUCAP 103* 85 122* 64* 137*    Microbiology: Results for orders placed or performed during the hospital encounter of 02/16/16  MRSA PCR Screening     Status: None   Collection Time: 02/16/16  7:00 PM  Result Value Ref Range Status   MRSA by PCR NEGATIVE NEGATIVE Final    Comment:        The GeneXpert MRSA Assay (FDA approved for NASAL specimens only), is one component of a comprehensive MRSA colonization surveillance program. It is not intended to diagnose MRSA infection nor to guide or monitor treatment for MRSA infections.     Coagulation Studies: No results for input(s): LABPROT, INR in the last 72 hours.  Urinalysis: No results for input(s): COLORURINE, LABSPEC, PHURINE, GLUCOSEU, HGBUR, BILIRUBINUR, KETONESUR, PROTEINUR, UROBILINOGEN, NITRITE, LEUKOCYTESUR in the last 72 hours.  Invalid input(s): APPERANCEUR    Imaging: Dg Thoracic Spine 2 View  02/17/2016  CLINICAL DATA:  Upper back pain.  Chest pain. EXAM: THORACIC SPINE 2 VIEWS COMPARISON:  02/16/2016 and 11/21/2015 FINDINGS: Stent  in the left subclavian graft. Indistinct interstitial opacity in the lungs with bilateral pleural effusions and thickening of the fissures. Interbody and interspinous fusion at T11-12. No thoracic spine fracture identified. No subluxation observed. Atherosclerotic calcifications in the upper abdomen. IMPRESSION: 1. Suspected pulmonary edema and small bilateral pleural effusions. 2. Interbody and interspinous fusion at T11-12. Electronically Signed   By: Van Clines M.D.   On: 02/17/2016 12:30   Ct Chest Wo Contrast  02/17/2016   CLINICAL DATA:  55 year old female status post thoracentesis. Bilateral pleural effusions. EXAM: CT CHEST WITHOUT CONTRAST TECHNIQUE: Multidetector CT imaging of the chest was performed following the standard protocol without IV contrast. COMPARISON:  Chest CT 11/21/2015. FINDINGS: Mediastinum/Lymph Nodes: Heart size is normal. There is no significant pericardial fluid, thickening or pericardial calcification. There is atherosclerosis of the thoracic aorta, the great vessels of the mediastinum and the coronary arteries, including calcified atherosclerotic plaque in the left main, left anterior descending, left circumflex and right coronary arteries. Multiple prominent borderline enlarged mediastinal and hilar lymph nodes. Mildly enlarged prevascular lymph node measuring up to 11 mm in short axis. Esophagus is unremarkable in appearance. No axillary lymphadenopathy. Vascular stent in the left axillary and subclavian vein. Lungs/Pleura: Mild diffuse interlobular septal thickening and a few patchy areas of very mild ground-glass attenuation, suggesting a background of mild pulmonary edema. No confluent consolidative airspace disease. Small bilateral pleural effusions, which appear at least partially loculated, particularly in the left hemithorax. No pneumothorax. Areas of mild scarring and/or atelectasis throughout the periphery of the lung bases bilaterally. Upper Abdomen: Unremarkable. Musculoskeletal/Soft Tissues: There are no aggressive appearing lytic or blastic lesions noted in the visualized portions of the skeleton. IMPRESSION: 1. The appearance of the chest suggests mild congestive heart failure, as above. 2. Associated with this there are small bilateral pleural effusions, which appear at least partially loculated particularly on the left side. 3. No pneumothorax following thoracentesis. 4. Multiple borderline enlarged and minimally enlarged mediastinal and bilateral hilar lymph nodes, nonspecific, but likely  reactive. 5. Atherosclerosis, including left main and 3 vessel coronary artery disease. Please note that although the presence of coronary artery calcium documents the presence of coronary artery disease, the severity of this disease and any potential stenosis cannot be assessed on this non-gated CT examination. Assessment for potential risk factor modification, dietary therapy or pharmacologic therapy may be warranted, if clinically indicated. 6. Additional incidental findings, as above. Electronically Signed   By: Vinnie Langton M.D.   On: 02/17/2016 15:50   Dg Chest Bilateral Decubitus  02/16/2016  CLINICAL DATA:  Patient with history of pleural effusion. EXAM: CHEST - BILATERAL DECUBITUS VIEW COMPARISON:  Chest radiograph earlier same day. FINDINGS: Bilateral decubitus images were obtained demonstrating small layering bilateral pleural effusions. Cardiomegaly. Diffuse bilateral perihilar interstitial pulmonary opacities compatible with edema. Stent graft material overlying the left axilla. No pneumothorax. IMPRESSION: Small layering bilateral pleural effusions. Cardiomegaly and interstitial pulmonary edema. Electronically Signed   By: Lovey Newcomer M.D.   On: 02/16/2016 16:37   Ct Abdomen Pelvis W Contrast  02/16/2016  CLINICAL DATA:  Abdominal pain.  Increasing abdominal to the touch. EXAM: CT ABDOMEN AND PELVIS WITH CONTRAST TECHNIQUE: Multidetector CT imaging of the abdomen and pelvis was performed using the standard protocol following bolus administration of intravenous contrast. CONTRAST:  146mL ISOVUE-300 IOPAMIDOL (ISOVUE-300) INJECTION 61% COMPARISON:  11/12/2015 FINDINGS: Lower chest: Loculated bilateral small pleural effusions, right greater than left. Normal heart size. Hepatobiliary: Normal liver.  Prior cholecystectomy. Pancreas: Normal. Spleen:  Normal. Adrenals/Urinary Tract: Normal adrenal glands. Bilateral atrophic kidneys. No urolithiasis or obstructive uropathy. Normal bladder.  Stomach/Bowel: No bowel wall thickening or bowel dilatation. No pneumatosis, pneumoperitoneum or portal venous gas. Moderate amount of stool throughout the colon. No abdominal or pelvic free fluid. Vascular/Lymphatic: Normal caliber abdominal aorta. Abdominal aortic atherosclerosis. Diffuse atherosclerosis of the superior mesenteric artery inferior mesenteric artery. No lymphadenopathy. Reproductive: Prior hysterectomy.  No adnexal mass. Other: No fluid collection or hematoma.  Diffuse anasarca. Musculoskeletal: No acute osseous abnormality. No lytic or sclerotic osseous lesion. IMPRESSION: 1. Diffuse anasarca. 2. Loculated bilateral small pleural effusions, right greater than left. 3. No bowel obstruction. 4. Previous demonstrated small enhancing right renal masses are not appreciated on the current examination and may be secondary to the phase of imaging. Consider nonemergent abdomen MRI without contrast for further evaluation. Electronically Signed   By: Kathreen Devoid   On: 02/16/2016 13:48   Dg Chest Portable 1 View  02/16/2016  CLINICAL DATA:  Shortness of breath. EXAM: PORTABLE CHEST 1 VIEW COMPARISON:  December 20, 2015. FINDINGS: Stable cardiomediastinal silhouette. No pneumothorax is noted. Mild bibasilar interstitial densities are noted with mild associated pleural effusions. Bony thorax is unremarkable. IMPRESSION: Mild bibasilar opacities are noted consistent with edema or atelectasis with associated pleural effusions, right greater than left. Electronically Signed   By: Marijo Conception, M.D.   On: 02/16/2016 08:51     Medications:     . acidophilus  1 capsule Oral Daily  . amLODipine  10 mg Oral Daily  . aspirin EC  81 mg Oral BH-q7a  . atorvastatin  20 mg Oral QPM  . citalopram  20 mg Oral BH-q7a  . clopidogrel  75 mg Oral Daily  . heparin subcutaneous  5,000 Units Subcutaneous Q8H  . hydrALAZINE  25 mg Oral TID  . insulin aspart  0-5 Units Subcutaneous QHS  . insulin aspart  0-9 Units  Subcutaneous TID WC  . insulin aspart  0-9 Units Subcutaneous TID WC  . lipase/protease/amylase  12,000 Units Oral TID WC  . loratadine  10 mg Oral Daily  . metoCLOPramide  10 mg Oral TID AC  . multivitamin  1 tablet Oral Daily  . omega-3 acid ethyl esters  1 g Oral Daily  . pantoprazole  40 mg Oral Daily  . patiromer  8.4 g Oral Daily  . ranolazine  1,000 mg Oral BID  . sevelamer carbonate  800 mg Oral TID WC  . vitamin B-12  1,000 mcg Oral Daily   acetaminophen **OR** acetaminophen, albuterol, HYDROcodone-acetaminophen, nitroGLYCERIN, promethazine, senna-docusate  Assessment/ Plan:  55 y.o. 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  Also with hx of bilateral renal masses.  [facility] MWF [place]  1.  End-stage renal disease on hemodialysis Monday, Wednesday, Friday. Patient had dialysis on Friday. No urgent indication for dialysis today. We will plan for hemodialysis again tomorrow.  2. Hyperkalemia. Patient has had multiple bouts of hyperkalemia. Therefore we will start the patient on patiromer 8.4g po daily.  Follow K.  3. Anemia of chronic kidney disease. Given prior noted renal mass we have been avoiding Epogen. Continue to monitor CBC.  4. Secondary hyperparathyroidism. We will plan to check PTH and phosphorus with dialysis tomorrow. Continue Renvela.  5. Bilateral pleural effusions. Patient  status post thoracentesis. Await further studies. Appreciate pulmonary input.  6. Right renal masses. These were not evident on the most recent CT scan. The patient has outpatient urology follow-up for this issue. We are holding Epogen as above.     LOS: 1 [name] 5/28/20174:47 PM

## 2016-02-17 NOTE — Procedures (Signed)
[place] right thoracentesis [id number] removed No complication No blood loss. See complete dictation in [facility]. CT to follow

## 2016-02-17 NOTE — Consult Note (Signed)
Pulmonary Critical Care  Initial Consult Note   [name] [id number] DOB: [birth year] DOA: 02/16/2016  Referring physician: Dr Benjie Karvonen PCP: Ellamae Sia, MD   Chief Complaint: Bilateral Pleural Effusions  HPI: [name] is a 55 y.o. female with prior history of ESRD on dialysis DM Asthma presented to the hospital with abdominal pain. Patient had a CT scan ordered and this reveals presence of bilateral effusions. On review of her scans she had a CT angio done back in march which shows bilateral effusions. I do see this was tapped back at that time. Low protein normal glucose and predominant Lymphs were noted at that time. Patient now has possible loculations noted. She needs to have a follow up CT and thoracentesis done. No fevers noted no chest pain presently. No cough noted.   Review of Systems:  Constitutional:  No weight loss, Fevers, chills, fatigue.  HEENT:  No headaches post nasal drip,  Cardio-vascular:  No chest pain, dizziness, palpitations  GI:  No heartburn, indigestion, +abdominal pain  Resp:  +shortness of breath No coughing up of blood Skin:  no rash or lesions.  Musculoskeletal:  No joint pain or swelling.   Remainder ROS performed and is unremarkable other than noted in HPI  Past Medical History  Diagnosis Date  . Asthma   . Collagen vascular disease ([place])   . Diabetes mellitus without complication ([place])   . Hypertension   . Coronary artery disease     a. cath 2013: stenting to RCA (report not available); b. cath 2014: LM nl, pLAD 40%, mLAD nl, ost LCx 40%, mid LCx nl, pRCA 30% @ site of prior stent, mRCA 50%  . Diabetic neuropathy ([place])   . ESRD (end stage renal disease) on dialysis ([place])     M-W-F  . GERD (gastroesophageal reflux disease)   . Hx of pancreatitis 2015  . Myocardial infarction ([place])   . Pneumonia   . Mitral regurgitation     a. echo 10/2013: EF 62%, noWMA, mildly dilated LA, mild to mod MR/TR, GR1DD  . COPD (chronic obstructive  pulmonary disease) ([place])   . chronic diastolic CHF [id number]  . dialysis 2006  . Depression   . Anxiety   . Arthritis   . Headache   . Anemia   . Renal insufficiency    Past Surgical History  Procedure Laterality Date  . Cholecystectomy    . Appendectomy    . Abdominal hysterectomy    . Dialysis fistula creation Left     upper arm  . Esophagogastroduodenoscopy N/A 03/08/2015    Procedure: ESOPHAGOGASTRODUODENOSCOPY (EGD);  Surgeon: Manya Silvas, MD;  Location:  [facility] ENDOSCOPY;  Service: Endoscopy;  Laterality: N/A;  . Cardiac catheterization Left 07/26/2015    Procedure: Left Heart Cath and Coronary Angiography;  Surgeon: Dionisio David, MD;  Location: [place] CV LAB;  Service: Cardiovascular;  Laterality: Left;  Marland Kitchen Eye surgery    . Fecal transplant N/A 08/23/2015    Procedure: FECAL TRANSPLANT;  Surgeon: Manya Silvas, MD;  Location: [facility] ENDOSCOPY;  Service: Endoscopy;  Laterality: N/A;  . Peripheral vascular catheterization N/A 12/20/2015    Procedure: Thrombectomy of dialysis access versus permcath placement;  Surgeon: Algernon Huxley, MD;  Location: [place] CV LAB;  Service: Cardiovascular;  Laterality: N/A;  . Peripheral vascular catheterization N/A 12/20/2015    Procedure: A/V Shunt Intervention;  Surgeon: Algernon Huxley, MD;  Location: [place] [facility] LAB;  Service: Cardiovascular;  Laterality: N/A;  .  Peripheral vascular catheterization N/A 12/20/2015    Procedure: A/V Shuntogram/Fistulagram;  Surgeon: Algernon Huxley, MD;  Location: [place] [facility] LAB;  Service: Cardiovascular;  Laterality: N/A;  . Peripheral vascular catheterization N/A 01/02/2016    Procedure: A/V Shuntogram/Fistulagram;  Surgeon: Algernon Huxley, MD;  Location: [place] [facility] LAB;  Service: Cardiovascular;  Laterality: N/A;  . Peripheral vascular catheterization N/A 01/02/2016    Procedure: A/V Shunt Intervention;  Surgeon: Algernon Huxley, MD;  Location: [place] [facility] LAB;  Service: Cardiovascular;   Laterality: N/A;   Social History:  reports that she quit smoking about a year ago. Her smoking use included Cigarettes. She smoked 0.50 packs per day. She has never used smokeless tobacco. She reports that she does not drink alcohol or use illicit drugs.  Allergies  Allergen Reactions  . Compazine [Prochlorperazine Edisylate] Anaphylaxis and Nausea And Vomiting  . Ace Inhibitors Swelling  . Ativan [Lorazepam] Other (See Comments)    Reaction:  Hallucinations and headaches  . Codeine Nausea And Vomiting  . Dilaudid [Hydromorphone Hcl] Other (See Comments)    Delirium  . Gabapentin Other (See Comments)    Reaction:  Unknown   . Losartan Other (See Comments)    Reaction:  Unknown   . Ondansetron Other (See Comments)    Reaction:  Unknown   . Prochlorperazine Other (See Comments)    Reaction:  Unknown   . Scopolamine Other (See Comments)    Reaction:  Unknown   . Zofran [Ondansetron Hcl] Other (See Comments)    Reaction:  hallucinations   . Oxycodone Anxiety  . Tape Rash    Family History  Problem Relation Age of Onset  . Kidney disease Mother   . Diabetes Mother     Prior to Admission medications   Medication Sig Start Date End Date Taking? Authorizing Provider  acidophilus (RISAQUAD) CAPS capsule Take 1 capsule by mouth daily.   Yes Historical Provider, MD  albuterol (PROVENTIL) (2.5 MG/3ML) 0.083% nebulizer solution Take 2.5 mg by nebulization every 4 (four) hours as needed for wheezing or shortness of breath.   Yes Historical Provider, MD  amLODipine (NORVASC) 10 MG tablet Take 10 mg by mouth daily.   Yes Historical Provider, MD  aspirin EC 81 MG tablet Take 81 mg by mouth every morning.    Yes Historical Provider, MD  atorvastatin (LIPITOR) 20 MG tablet Take 20 mg by mouth every evening.   Yes Historical Provider, MD  cetirizine (ZYRTEC) 10 MG tablet Take 10 mg by mouth daily.   Yes Historical Provider, MD  citalopram (CELEXA) 20 MG tablet Take 20 mg by mouth every  morning.    Yes Historical Provider, MD  clopidogrel (PLAVIX) 75 MG tablet Take 75 mg by mouth daily. Reported on 12/17/2015   Yes Historical Provider, MD  hydrALAZINE (APRESOLINE) 25 MG tablet Take 1 tablet (25 mg total) by mouth every 8 (eight) hours. Patient taking differently: Take 25 mg by mouth 3 (three) times daily.  12/21/15  Yes Dustin Flock, MD  HYDROcodone-acetaminophen (NORCO/VICODIN) 5-325 MG tablet Take 1 tablet by mouth every 6 (six) hours as needed for moderate pain or severe pain.   Yes Historical Provider, MD  lipase/protease/amylase (CREON) 12000 UNITS CPEP capsule Take 12,000 Units by mouth 3 (three) times daily with meals.    Yes Historical Provider, MD  metoCLOPramide (REGLAN) 10 MG tablet Take 1 tablet (10 mg total) by mouth 3 (three) times daily before meals. 12/21/15  Yes Dustin Flock, MD  multivitamin (RENA-VIT) TABS tablet Take 1 tablet by mouth daily.   Yes Historical Provider, MD  nitroGLYCERIN (NITROSTAT) 0.4 MG SL tablet Place 0.4 mg under the tongue every 5 (five) minutes x 3 doses as needed for chest pain. *Max 3 doses per episode*   Yes Historical Provider, MD  omega-3 acid ethyl esters (LOVAZA) 1 g capsule Take 1 g by mouth daily.   Yes Historical Provider, MD  promethazine (PHENERGAN) 25 MG tablet Take 25 mg by mouth every 6 (six) hours as needed for nausea or vomiting.   Yes Historical Provider, MD  ranitidine (ZANTAC) 150 MG tablet Take 150 mg by mouth every evening.    Yes Historical Provider, MD  ranolazine (RANEXA) 1000 MG SR tablet Take 1,000 mg by mouth 2 (two) times daily.   Yes Historical Provider, MD  sevelamer carbonate (RENVELA) 800 MG tablet Take 800 mg by mouth 3 (three) times daily with meals.    Yes Historical Provider, MD  vitamin B-12 (CYANOCOBALAMIN) 1000 MCG tablet Take 1,000 mcg by mouth daily.   Yes Historical Provider, MD  bisacodyl (DULCOLAX) 5 MG EC tablet Take 1 tablet (5 mg total) by mouth daily as needed for moderate constipation.  12/01/15   Vaughan Basta, MD  oxymetazoline (AFRIN) 0.05 % nasal spray Place 2 sprays into both nostrils 2 (two) times daily. 12/29/15   Loney Hering, MD   Physical Exam: Filed Vitals:   02/16/16 2032 02/17/16 0957 02/17/16 1322 02/17/16 1324  BP: 143/56 167/68 173/65 168/67  Pulse: 84 81 76 75  Temp: 98 F (36.7 C)  98.2 F (36.8 C)   TempSrc: Oral  Oral   Resp: 16  16   Height:      Weight:      SpO2: 97% 97% 96%     Wt Readings from Last 3 Encounters:  02/16/16 68.221 kg (150 lb 6.4 oz)  01/02/16 64.864 kg (143 lb)  12/28/15 64.864 kg (143 lb)    General:  Appears calm and comfortable Eyes: PERRL, normal lids, irises & conjunctiva ENT: grossly normal hearing, lips & tongue Neck: no LAD, masses or thyromegaly Cardiovascular: RRR, no m/r/g.  Respiratory: diminished at the bases bilaterally, Normal respiratory effort. Abdomen: soft, no rebound Skin: no rash or induration seen on limited exam Musculoskeletal: grossly normal tone BUE/BLE Psychiatric: grossly normal mood and affect Neurologic: grossly non-focal.          Labs on Admission:  Basic Metabolic Panel:  Recent Labs Lab 02/16/16 0819 02/17/16 0747  NA 135 132*  K 4.7 5.2*  CL 97* 96*  CO2 28 27  GLUCOSE 69 74  BUN 18 26*  CREATININE 3.54* 4.62*  CALCIUM 8.8* 8.8*   Liver Function Tests:  Recent Labs Lab 02/16/16 0819 02/17/16 0747  AST 31 19  ALT 16 11*  ALKPHOS 116 111  BILITOT 0.6 0.6  PROT 7.4 7.3  ALBUMIN 3.1* 2.9*    Recent Labs Lab 02/16/16 0819  LIPASE 58*   No results for input(s): AMMONIA in the last 168 hours. CBC:  Recent Labs Lab 02/16/16 0819 02/17/16 0747  WBC 7.8 7.3  HGB 11.6* 11.5*  HCT 36.6 36.3  MCV 82.0 83.4  PLT 223 242   Cardiac Enzymes:  Recent Labs Lab 02/16/16 0819  TROPONINI 0.05*    BNP (last 3 results)  Recent Labs  10/03/15 0116 10/07/15 0358 10/22/15 1713  BNP 948.0* 4204.0* 1033.0*    ProBNP (last 3 results) No  results for input(s): PROBNP  in the last 8760 hours.  CBG:  Recent Labs Lab 02/16/16 0922 02/16/16 1635 02/16/16 2130 02/17/16 0730 02/17/16 1131  GLUCAP 103* 66 122* 64* 137*    Radiological Exams on Admission: Dg Thoracic Spine 2 View  02/17/2016  CLINICAL DATA:  Upper back pain.  Chest pain. EXAM: THORACIC SPINE 2 VIEWS COMPARISON:  02/16/2016 and 11/21/2015 FINDINGS: Stent in the left subclavian graft. Indistinct interstitial opacity in the lungs with bilateral pleural effusions and thickening of the fissures. Interbody and interspinous fusion at T11-12. No thoracic spine fracture identified. No subluxation observed. Atherosclerotic calcifications in the upper abdomen. IMPRESSION: 1. Suspected pulmonary edema and small bilateral pleural effusions. 2. Interbody and interspinous fusion at T11-12. Electronically Signed   By: Van Clines M.D.   On: 02/17/2016 12:30   Dg Chest Bilateral Decubitus  02/16/2016  CLINICAL DATA:  Patient with history of pleural effusion. EXAM: CHEST - BILATERAL DECUBITUS VIEW COMPARISON:  Chest radiograph earlier same day. FINDINGS: Bilateral decubitus images were obtained demonstrating small layering bilateral pleural effusions. Cardiomegaly. Diffuse bilateral perihilar interstitial pulmonary opacities compatible with edema. Stent graft material overlying the left axilla. No pneumothorax. IMPRESSION: Small layering bilateral pleural effusions. Cardiomegaly and interstitial pulmonary edema. Electronically Signed   By: Lovey Newcomer M.D.   On: 02/16/2016 16:37   Ct Abdomen Pelvis W Contrast  02/16/2016  CLINICAL DATA:  Abdominal pain.  Increasing abdominal to the touch. EXAM: CT ABDOMEN AND PELVIS WITH CONTRAST TECHNIQUE: Multidetector CT imaging of the abdomen and pelvis was performed using the standard protocol following bolus administration of intravenous contrast. CONTRAST:  182mL ISOVUE-300 IOPAMIDOL (ISOVUE-300) INJECTION 61% COMPARISON:  11/12/2015  FINDINGS: Lower chest: Loculated bilateral small pleural effusions, right greater than left. Normal heart size. Hepatobiliary: Normal liver.  Prior cholecystectomy. Pancreas: Normal. Spleen: Normal. Adrenals/Urinary Tract: Normal adrenal glands. Bilateral atrophic kidneys. No urolithiasis or obstructive uropathy. Normal bladder. Stomach/Bowel: No bowel wall thickening or bowel dilatation. No pneumatosis, pneumoperitoneum or portal venous gas. Moderate amount of stool throughout the colon. No abdominal or pelvic free fluid. Vascular/Lymphatic: Normal caliber abdominal aorta. Abdominal aortic atherosclerosis. Diffuse atherosclerosis of the superior mesenteric artery inferior mesenteric artery. No lymphadenopathy. Reproductive: Prior hysterectomy.  No adnexal mass. Other: No fluid collection or hematoma.  Diffuse anasarca. Musculoskeletal: No acute osseous abnormality. No lytic or sclerotic osseous lesion. IMPRESSION: 1. Diffuse anasarca. 2. Loculated bilateral small pleural effusions, right greater than left. 3. No bowel obstruction. 4. Previous demonstrated small enhancing right renal masses are not appreciated on the current examination and may be secondary to the phase of imaging. Consider nonemergent abdomen MRI without contrast for further evaluation. Electronically Signed   By: Kathreen Devoid   On: 02/16/2016 13:48   Dg Chest Portable 1 View  02/16/2016  CLINICAL DATA:  Shortness of breath. EXAM: PORTABLE CHEST 1 VIEW COMPARISON:  December 20, 2015. FINDINGS: Stable cardiomediastinal silhouette. No pneumothorax is noted. Mild bibasilar interstitial densities are noted with mild associated pleural effusions. Bony thorax is unremarkable. IMPRESSION: Mild bibasilar opacities are noted consistent with edema or atelectasis with associated pleural effusions, right greater than left. Electronically Signed   By: Marijo Conception, M.D.   On: 02/16/2016 08:51    EKG: Independently reviewed.  Assessment/Plan Active  Problems:   Abdominal pain   1. Pleural Effusion -will get CT of the chest -should get a US guided thoracentesis -send fluid for routine labs  2. ESRD on HD -per nephrology   Code Status: full code  Time spent: 52min  I have personally obtained a history, examined the patient, evaluated laboratory and imaging results, formulated the assessment and plan and placed orders.  The Patient requires high complexity decision making for assessment and support.    Allyne Gee, MD [facility] Pulmonary Critical Care Medicine Sleep Medicine

## 2016-02-17 NOTE — Progress Notes (Signed)
[place] [facility] [place] NAME: [name]    MR#:  [id number]  DATE OF BIRTH:  05/06/1961  SUBJECTIVE:  CHIEF COMPLAINT:   Chief Complaint  Patient presents with  . Abdominal Pain     Surgical disease on hemodialysis came with abdominal pain, also having lower chest pain bilateral and upper back pain.  Have generalized edema and bilateral pleural effusion as per the chest x-ray and CT abdomen, but no other significant findings.  REVIEW OF SYSTEMS:  CONSTITUTIONAL: No fever, fatigue or weakness.  EYES: No blurred or double vision.  EARS, NOSE, AND THROAT: No tinnitus or ear pain.  RESPIRATORY: No cough, shortness of breath, wheezing or hemoptysis.  CARDIOVASCULAR: No chest pain, orthopnea, edema.  GASTROINTESTINAL:Some nausea, no vomiting, diarrhea , epigastric abdominal pain.  GENITOURINARY: No dysuria, hematuria.  ENDOCRINE: No polyuria, nocturia,  HEMATOLOGY: No anemia, easy bruising or bleeding SKIN: No rash or lesion. MUSCULOSKELETAL: No joint pain or arthritis.  Upper back pain. NEUROLOGIC: No tingling, numbness, weakness.  PSYCHIATRY: No anxiety or depression.   ROS  DRUG ALLERGIES:   Allergies  Allergen Reactions  . Compazine [Prochlorperazine Edisylate] Anaphylaxis and Nausea And Vomiting  . Ace Inhibitors Swelling  . Ativan [Lorazepam] Other (See Comments)    Reaction:  Hallucinations and headaches  . Codeine Nausea And Vomiting  . Dilaudid [Hydromorphone Hcl] Other (See Comments)    Delirium  . Gabapentin Other (See Comments)    Reaction:  Unknown   . Losartan Other (See Comments)    Reaction:  Unknown   . Ondansetron Other (See Comments)    Reaction:  Unknown   . Prochlorperazine Other (See Comments)    Reaction:  Unknown   . Scopolamine Other (See Comments)    Reaction:  Unknown   . Zofran [Ondansetron Hcl] Other (See Comments)    Reaction:  hallucinations   . Oxycodone Anxiety  . Tape Rash    VITALS:  Blood  pressure 167/68, pulse 81, temperature 98 F (36.7 C), temperature source Oral, resp. rate 16, height 5' 5.5" (1.664 m), weight 68.221 kg (150 lb 6.4 oz), SpO2 97 %.  PHYSICAL EXAMINATION:  GENERAL:  55 y.o.-year-old patient lying in the bed with no acute distress.  EYES: Pupils equal, round, reactive to light and accommodation. No scleral icterus. Extraocular muscles intact.  HEENT: Head atraumatic, normocephalic. Oropharynx and nasopharynx clear.  NECK:  Supple, no jugular venous distention. No thyroid enlargement, no tenderness.  LUNGS: Normal breath sounds bilaterally, no wheezing, rales,rhonchi or crepitation. No use of accessory muscles of respiration.  CARDIOVASCULAR: S1, S2 normal. No murmurs, rubs, or gallops.  ABDOMEN: Soft, epigastric tenderness , nondistended. Bowel sounds present. No organomegaly or mass. Tenderness present on palpation of the thoracic vertebra EXTREMITIES: Positive pedal edema, no cyanosis, or clubbing. AV graft on left arm. NEUROLOGIC: Cranial nerves II through XII are intact. Muscle strength 5/5 in all extremities. Sensation intact. Gait not checked.  PSYCHIATRIC: The patient is alert and oriented x 3.  SKIN: No obvious rash, lesion, or ulcer.   Physical Exam LABORATORY PANEL:   CBC  Recent Labs Lab 02/17/16 0747  WBC 7.3  HGB 11.5*  HCT 36.3  PLT 242   ------------------------------------------------------------------------------------------------------------------  Chemistries   Recent Labs Lab 02/17/16 0747  NA 132*  K 5.2*  CL 96*  CO2 27  GLUCOSE 74  BUN 26*  CREATININE 4.62*  CALCIUM 8.8*  AST 19  ALT 11*  ALKPHOS 111  BILITOT 0.6   ------------------------------------------------------------------------------------------------------------------  Cardiac Enzymes  Recent Labs Lab 02/16/16 0819  TROPONINI 0.05*    ------------------------------------------------------------------------------------------------------------------  RADIOLOGY:  Dg Chest Bilateral Decubitus  02/16/2016  CLINICAL DATA:  Patient with history of pleural effusion. EXAM: CHEST - BILATERAL DECUBITUS VIEW COMPARISON:  Chest radiograph earlier same day. FINDINGS: Bilateral decubitus images were obtained demonstrating small layering bilateral pleural effusions. Cardiomegaly. Diffuse bilateral perihilar interstitial pulmonary opacities compatible with edema. Stent graft material overlying the left axilla. No pneumothorax. IMPRESSION: Small layering bilateral pleural effusions. Cardiomegaly and interstitial pulmonary edema. Electronically Signed   By: Lovey Newcomer M.D.   On: 02/16/2016 16:37   Ct Abdomen Pelvis W Contrast  02/16/2016  CLINICAL DATA:  Abdominal pain.  Increasing abdominal to the touch. EXAM: CT ABDOMEN AND PELVIS WITH CONTRAST TECHNIQUE: Multidetector CT imaging of the abdomen and pelvis was performed using the standard protocol following bolus administration of intravenous contrast. CONTRAST:  179mL ISOVUE-300 IOPAMIDOL (ISOVUE-300) INJECTION 61% COMPARISON:  11/12/2015 FINDINGS: Lower chest: Loculated bilateral small pleural effusions, right greater than left. Normal heart size. Hepatobiliary: Normal liver.  Prior cholecystectomy. Pancreas: Normal. Spleen: Normal. Adrenals/Urinary Tract: Normal adrenal glands. Bilateral atrophic kidneys. No urolithiasis or obstructive uropathy. Normal bladder. Stomach/Bowel: No bowel wall thickening or bowel dilatation. No pneumatosis, pneumoperitoneum or portal venous gas. Moderate amount of stool throughout the colon. No abdominal or pelvic free fluid. Vascular/Lymphatic: Normal caliber abdominal aorta. Abdominal aortic atherosclerosis. Diffuse atherosclerosis of the superior mesenteric artery inferior mesenteric artery. No lymphadenopathy. Reproductive: Prior hysterectomy.  No adnexal mass.  Other: No fluid collection or hematoma.  Diffuse anasarca. Musculoskeletal: No acute osseous abnormality. No lytic or sclerotic osseous lesion. IMPRESSION: 1. Diffuse anasarca. 2. Loculated bilateral small pleural effusions, right greater than left. 3. No bowel obstruction. 4. Previous demonstrated small enhancing right renal masses are not appreciated on the current examination and may be secondary to the phase of imaging. Consider nonemergent abdomen MRI without contrast for further evaluation. Electronically Signed   By: Kathreen Devoid   On: 02/16/2016 13:48   Dg Chest Portable 1 View  02/16/2016  CLINICAL DATA:  Shortness of breath. EXAM: PORTABLE CHEST 1 VIEW COMPARISON:  December 20, 2015. FINDINGS: Stable cardiomediastinal silhouette. No pneumothorax is noted. Mild bibasilar interstitial densities are noted with mild associated pleural effusions. Bony thorax is unremarkable. IMPRESSION: Mild bibasilar opacities are noted consistent with edema or atelectasis with associated pleural effusions, right greater than left. Electronically Signed   By: Marijo Conception, M.D.   On: 02/16/2016 08:51    ASSESSMENT AND PLAN:   Active Problems:   Abdominal pain  55 year old female with ESRD on hemodialysis, diabetes, ASCVD who presents with abdominal pain.  1. Abdominal pain: Patient is significantly tender on examination. She reports that this abdominal pain is different than previous episodes abdominal pain. CT scan of abdomen is without any acute finding. Chest x-ray consistent with atelectasis, bilateral pleural effusion, likely not the etiology of her abdominal pain. Lipase is slightly high, I will check it again today and if it is still high then we may have to consider acute pancreatitis though there are no findings on CT of the abdomen.    She also had tenderness on the upper thoracic vertebrae hours on local pressure, I will get her x-ray thoracic spine.  2. ESRD on hemodialysis: Consult nephrology  is a patient will also be receiving IV contrast for CT scan.   As she has edema and pleural effusion with pain ,she may need dialysis.  3. ASCVD: Continue aspirin, atorvastatin,  hydralazine and Plavix.  4. Essential hypertension: Continue Norvasc and hydralazine.  5. Diabetes with gastroparesis: Sliding scale insulin and Reglan.   All the records are reviewed and case discussed with Care Management/Social Workerr. Management plans discussed with the patient, family and they are in agreement.  CODE STATUS: Full  TOTAL TIME TAKING CARE OF THIS PATIENT: 35 minutes.     POSSIBLE D/C IN 1-2 DAYS, DEPENDING ON CLINICAL CONDITION.   Vaughan Basta M.D on 02/17/2016   Between 7am to 6pm - Pager - [phone]  After 6pm go to [email or web address] - password [email or web address] [facility]  Office  [phone]  CC: Primary care physician; Ellamae Sia, MD  Note: This dictation was prepared with Dragon dictation along with smaller phrase technology. Any transcriptional errors that result from this process are unintentional.

## 2016-02-18 LAB — GLUCOSE, CAPILLARY
Glucose-Capillary: 51 mg/dL — ABNORMAL LOW (ref 65–99)
Glucose-Capillary: 101 mg/dL — ABNORMAL HIGH (ref 65–99)
Glucose-Capillary: 72 mg/dL (ref 65–99)
Glucose-Capillary: 94 mg/dL (ref 65–99)
Glucose-Capillary: 101 mg/dL — ABNORMAL HIGH (ref 65–99)
Glucose-Capillary: 100 mg/dL — ABNORMAL HIGH (ref 65–99)
Glucose-Capillary: 95 mg/dL (ref 65–99)

## 2016-02-18 LAB — PHOSPHORUS: Phosphorus: 5.5 mg/dL — ABNORMAL HIGH (ref 2.5–4.6)

## 2016-02-18 MED ORDER — LIDOCAINE HCL (PF) 1 % IJ SOLN
5.0000 mL | INTRAMUSCULAR | Status: DC | PRN
  Filled 2016-02-18: qty 5

## 2016-02-18 MED ORDER — HEPARIN SODIUM (PORCINE) 1000 UNIT/ML DIALYSIS: 1000.0000 [IU] | INTRAMUSCULAR | Status: DC | PRN

## 2016-02-18 MED ORDER — ALTEPLASE 2 MG IJ SOLR
2.0000 mg | Freq: Once | INTRAMUSCULAR | Status: DC | PRN
  Filled 2016-02-18: qty 2

## 2016-02-18 MED ORDER — SODIUM CHLORIDE 0.9 % IV SOLN: 100.0000 mL | INTRAVENOUS | Status: DC | PRN

## 2016-02-18 MED ORDER — PENTAFLUOROPROP-TETRAFLUOROETH EX AERO
1.0000 "application " | INHALATION_SPRAY | CUTANEOUS | Status: DC | PRN
  Filled 2016-02-18: qty 30

## 2016-02-18 MED ORDER — LIDOCAINE-PRILOCAINE 2.5-2.5 % EX CREA
1.0000 "application " | TOPICAL_CREAM | CUTANEOUS | Status: DC | PRN
  Filled 2016-02-18: qty 5

## 2016-02-18 NOTE — Progress Notes (Signed)
[facility] [place] NAME: [name]    MR#:  [id number]  DATE OF BIRTH:  17-Dec-1960  SUBJECTIVE:  CHIEF COMPLAINT:   Chief Complaint  Patient presents with  . Abdominal Pain     End stage renal disease on hemodialysis came with abdominal pain, also having lower chest pain bilateral and upper back pain.  Have generalized edema and bilateral pleural effusion as per the chest x-ray and CT abdomen, but no other significant findings.   As per pulmonary advise- Thoracentesis was done.  REVIEW OF SYSTEMS:  CONSTITUTIONAL: No fever, fatigue or weakness.  EYES: No blurred or double vision.  EARS, NOSE, AND THROAT: No tinnitus or ear pain.  RESPIRATORY: No cough, shortness of breath, wheezing or hemoptysis.  CARDIOVASCULAR: No chest pain, orthopnea, edema.  GASTROINTESTINAL:Some nausea, no vomiting, diarrhea , epigastric abdominal pain.  GENITOURINARY: No dysuria, hematuria.  ENDOCRINE: No polyuria, nocturia,  HEMATOLOGY: No anemia, easy bruising or bleeding SKIN: No rash or lesion. MUSCULOSKELETAL: No joint pain or arthritis.  Upper back pain. NEUROLOGIC: No tingling, numbness, weakness.  PSYCHIATRY: No anxiety or depression.   ROS  DRUG ALLERGIES:   Allergies  Allergen Reactions  . Compazine [Prochlorperazine Edisylate] Anaphylaxis and Nausea And Vomiting  . Ace Inhibitors Swelling  . Ativan [Lorazepam] Other (See Comments)    Reaction:  Hallucinations and headaches  . Codeine Nausea And Vomiting  . Dilaudid [Hydromorphone Hcl] Other (See Comments)    Delirium  . Gabapentin Other (See Comments)    Reaction:  Unknown   . Losartan Other (See Comments)    Reaction:  Unknown   . Ondansetron Other (See Comments)    Reaction:  Unknown   . Prochlorperazine Other (See Comments)    Reaction:  Unknown   . Scopolamine Other (See Comments)    Reaction:  Unknown   . Zofran [Ondansetron Hcl] Other (See Comments)    Reaction:  hallucinations    . Oxycodone Anxiety  . Tape Rash    VITALS:  Blood pressure 114/40, pulse 57, temperature 98.4 F (36.9 C), temperature source Oral, resp. rate 16, height 5' 5.5" (1.664 m), weight 68.221 kg (150 lb 6.4 oz), SpO2 97 %.  PHYSICAL EXAMINATION:  GENERAL:  55 y.o.-year-old patient lying in the bed with no acute distress.  EYES: Pupils equal, round, reactive to light and accommodation. No scleral icterus. Extraocular muscles intact.  HEENT: Head atraumatic, normocephalic. Oropharynx and nasopharynx clear.  NECK:  Supple, no jugular venous distention. No thyroid enlargement, no tenderness.  LUNGS: Normal breath sounds bilaterally, no wheezing, rales,rhonchi or crepitation. No use of accessory muscles of respiration.  CARDIOVASCULAR: S1, S2 normal. No murmurs, rubs, or gallops.  ABDOMEN: Soft, epigastric tenderness , nondistended. Bowel sounds present. No organomegaly or mass. Tenderness present on palpation of the thoracic vertebra EXTREMITIES: Positive pedal edema, no cyanosis, or clubbing. AV graft on left arm. NEUROLOGIC: Cranial nerves II through XII are intact. Muscle strength 5/5 in all extremities. Sensation intact. Gait not checked.  PSYCHIATRIC: The patient is alert and oriented x 3.  SKIN: No obvious rash, lesion, or ulcer.   Physical Exam LABORATORY PANEL:   CBC  Recent Labs Lab 02/17/16 0747  WBC 7.3  HGB 11.5*  HCT 36.3  PLT 242   ------------------------------------------------------------------------------------------------------------------  Chemistries   Recent Labs Lab 02/17/16 0747  NA 132*  K 5.2*  CL 96*  CO2 27  GLUCOSE 74  BUN 26*  CREATININE 4.62*  CALCIUM 8.8*  AST 19  ALT 11*  ALKPHOS 111  BILITOT 0.6   ------------------------------------------------------------------------------------------------------------------  Cardiac Enzymes  Recent Labs Lab 02/16/16 0819  TROPONINI 0.05*    ------------------------------------------------------------------------------------------------------------------  RADIOLOGY:  Dg Thoracic Spine 2 View  02/17/2016  CLINICAL DATA:  Upper back pain.  Chest pain. EXAM: THORACIC SPINE 2 VIEWS COMPARISON:  02/16/2016 and 11/21/2015 FINDINGS: Stent in the left subclavian graft. Indistinct interstitial opacity in the lungs with bilateral pleural effusions and thickening of the fissures. Interbody and interspinous fusion at T11-12. No thoracic spine fracture identified. No subluxation observed. Atherosclerotic calcifications in the upper abdomen. IMPRESSION: 1. Suspected pulmonary edema and small bilateral pleural effusions. 2. Interbody and interspinous fusion at T11-12. Electronically Signed   By: Van Clines M.D.   On: 02/17/2016 12:30   Ct Chest Wo Contrast  02/17/2016  CLINICAL DATA:  55 year old female status post thoracentesis. Bilateral pleural effusions. EXAM: CT CHEST WITHOUT CONTRAST TECHNIQUE: Multidetector CT imaging of the chest was performed following the standard protocol without IV contrast. COMPARISON:  Chest CT 11/21/2015. FINDINGS: Mediastinum/Lymph Nodes: Heart size is normal. There is no significant pericardial fluid, thickening or pericardial calcification. There is atherosclerosis of the thoracic aorta, the great vessels of the mediastinum and the coronary arteries, including calcified atherosclerotic plaque in the left main, left anterior descending, left circumflex and right coronary arteries. Multiple prominent borderline enlarged mediastinal and hilar lymph nodes. Mildly enlarged prevascular lymph node measuring up to 11 mm in short axis. Esophagus is unremarkable in appearance. No axillary lymphadenopathy. Vascular stent in the left axillary and subclavian vein. Lungs/Pleura: Mild diffuse interlobular septal thickening and a few patchy areas of very mild ground-glass attenuation, suggesting a background of mild pulmonary  edema. No confluent consolidative airspace disease. Small bilateral pleural effusions, which appear at least partially loculated, particularly in the left hemithorax. No pneumothorax. Areas of mild scarring and/or atelectasis throughout the periphery of the lung bases bilaterally. Upper Abdomen: Unremarkable. Musculoskeletal/Soft Tissues: There are no aggressive appearing lytic or blastic lesions noted in the visualized portions of the skeleton. IMPRESSION: 1. The appearance of the chest suggests mild congestive heart failure, as above. 2. Associated with this there are small bilateral pleural effusions, which appear at least partially loculated particularly on the left side. 3. No pneumothorax following thoracentesis. 4. Multiple borderline enlarged and minimally enlarged mediastinal and bilateral hilar lymph nodes, nonspecific, but likely reactive. 5. Atherosclerosis, including left main and 3 vessel coronary artery disease. Please note that although the presence of coronary artery calcium documents the presence of coronary artery disease, the severity of this disease and any potential stenosis cannot be assessed on this non-gated CT examination. Assessment for potential risk factor modification, dietary therapy or pharmacologic therapy may be warranted, if clinically indicated. 6. Additional incidental findings, as above. Electronically Signed   By: Vinnie Langton M.D.   On: 02/17/2016 15:50   US Thoracentesis Asp Pleural Space W/img Guide  02/17/2016  CLINICAL DATA:  Chest pain, loculated pleural effusion EXAM: EXAM THORACENTESIS WITH ULTRASOUND TECHNIQUE: The procedure, risks (including but not limited to bleeding, infection, organ damage ), benefits, and alternatives were explained to the patient. Questions regarding the procedure were encouraged and answered. The patient understands and consents to the procedure. Survey ultrasound of the right hemithorax was performed and an appropriate skin entry site  was localized. Site was marked, prepped with chlorhexidine, draped in usual sterile fashion, infiltrated locally with 1% lidocaine. The Saf-T-Centesis needle was advanced into the pleural space. Cloudy fluid returned. 300 mL  was removed. Samples sent for requested studies. The patient tolerated procedure well. COMPLICATIONS: COMPLICATIONS None immediate IMPRESSION: Technically successful ultrasound-guided right thoracentesis. Follow-up chest CT pending. Electronically Signed   By: Lucrezia Europe M.D.   On: 02/17/2016 17:02    ASSESSMENT AND PLAN:   Active Problems:   Abdominal pain  55 year old female with ESRD on hemodialysis, diabetes, ASCVD who presents with abdominal pain.  1. Abdominal pain: Patient is significantly tender on examination. She reports that this abdominal pain is different than previous episodes abdominal pain. CT scan of abdomen is without any acute finding. Chest x-ray consistent with atelectasis, bilateral pleural effusion, likely not the etiology of her abdominal pain. Lipase is slightly high, but came down on further follow up, CT abd have no findings of acute pancreatitis.   She also had tenderness on the upper thoracic vertebrae hours on local pressure, Xray thorasic spine is negative.  2. ESRD on hemodialysis: Consult nephrology is a patient will also be receiving IV contrast for CT scan.   As she has edema and pleural effusion with pain ,she may need dialysis.  3. ASCVD: Continue aspirin, atorvastatin, hydralazine and Plavix.  4. Essential hypertension: Continue Norvasc and hydralazine.  5. Diabetes with gastroparesis: Sliding scale insulin and Reglan.  6. Pleural effusion   Thoracentesis is done- have many WBCs.    As pt does not have Raised WBcs or fever, no need  For urgent ABx.   I will wait for Pulmonary to follow.   All the records are reviewed and case discussed with Care Management/Social Workerr. Management plans discussed with the patient, family and  they are in agreement.  CODE STATUS: Full  TOTAL TIME TAKING CARE OF THIS PATIENT: 35 minutes.     POSSIBLE D/C IN 1-2 DAYS, DEPENDING ON CLINICAL CONDITION.   Vaughan Basta M.D on 02/18/2016   Between 7am to 6pm - Pager - [phone]  After 6pm go to [email or web address] - password [email or web address] [facility]  Office  [phone]  CC: Primary care physician; Ellamae Sia, MD  Note: This dictation was prepared with Dragon dictation along with smaller phrase technology. Any transcriptional errors that result from this process are unintentional.

## 2016-02-18 NOTE — Progress Notes (Signed)
Pt BS at HS was 224, 2 units admin. Pt. Noted that she does not take insulin at home but agreed to take it. Pt called around 01:00 and asked for her BS to be checked as she felt sweaty and hot, on check Pt's BS 51. Pt asked for fruit cocktail and sugar which she put in the fruit cocktail. On recheck BS back up to 72. Pt then asked for apple juice which was given. Will continue to monitor. Pt noted that she is "Ok" with her BS being high at night because it will usually drop by the morning.

## 2016-02-18 NOTE — Progress Notes (Signed)
Post hd tx 

## 2016-02-18 NOTE — Progress Notes (Signed)
Pre-hd tx 

## 2016-02-18 NOTE — Progress Notes (Signed)
Initial Nutrition Assessment  DOCUMENTATION CODES:   Not applicable  INTERVENTION:  -Cater to pt preferences, pt may benefit from smaller, more frequent meals -Add Mighty shake at breakfast, magic cup at lunch and dinner  NUTRITION DIAGNOSIS:   Increased nutrient needs related to chronic illness as evidenced by estimated needs.  GOAL:   Patient will meet greater than or equal to 90% of their needs  MONITOR:   PO intake  REASON FOR ASSESSMENT:   Malnutrition Screening Tool    ASSESSMENT:   55 yo female admitted with abdominl pain; pt with ESRD on dialysis. Pt also with hx of DM with gastroparesis, pancreatitis 55 yo female admitted with abdominal pain; pt with ESRD on dialysis. Pt also with hx of DM with gastroparesis, pancreatitis   Unable to complete Nutrition-Focused physical exam at this time. Pt out of room for dialysis on visit today. Noted pt with 1+ mild generalized edema per nursing exam  Pt unavailable for weight or food history; pt has been assessed on previous admissions and met clinical characteristics for moderate malnutrition in context of acute illness. At that time, pt not eating well, did not like [employer] but using [employer] instead   Past Medical History  Diagnosis Date  . Asthma   . Collagen vascular disease ([place])   . Diabetes mellitus without complication ([place])   . Hypertension   . Coronary artery disease     a. cath 2013: stenting to RCA (report not available); b. cath 2014: LM nl, pLAD 40%, mLAD nl, ost LCx 40%, mid LCx nl, pRCA 30% @ site of prior stent, mRCA 50%  . Diabetic neuropathy ([place])   . ESRD (end stage renal disease) on dialysis ([place])     M-W-F  . GERD (gastroesophageal reflux disease)   . Hx of pancreatitis 2015  . Myocardial infarction ([place])   . Pneumonia   . Mitral regurgitation     a. echo 10/2013: EF 62%, noWMA, mildly dilated LA, mild to mod MR/TR, GR1DD  . COPD (chronic obstructive pulmonary disease) ([place])   .  chronic diastolic CHF 0/26/3785  . dialysis 2006  . Depression   . Anxiety   . Arthritis   . Headache   . Anemia   . Renal insufficiency    Diet Order:  Diet renal with fluid restriction Fluid restriction:: 1200 mL Fluid; Room service appropriate?: Yes; Fluid consistency:: Thin  Energy Intake: recorded po intake 83% of meals on average  Skin:  Reviewed, no issues  Last BM:  02/17/16   Labs: phosphorus 5.5 (on binder therapy and low phos diet)  Glucose Profile:   Recent Labs  02/18/16 0225 02/18/16 0355 02/18/16 0738  GLUCAP 101* 94 101*     Meds: acidophilus, ss novolog, creon, reglan, MVI, renvela  Height:   Ht Readings from Last 1 Encounters:  02/16/16 5' 5.5" (1.664 m)    Weight: weight trend as per weight encounters; no documented trend of wt loss  Wt Readings from Last 1 Encounters:  02/16/16 150 lb 6.4 oz (68.221 kg)    Wt Readings from Last 10 Encounters:  02/16/16 150 lb 6.4 oz (68.221 kg)  01/02/16 143 lb (64.864 kg)  12/28/15 143 lb (64.864 kg)  12/19/15 138 lb 0.1 oz (62.6 kg)  11/30/15 145 lb 11.6 oz (66.1 kg)  11/09/15 153 lb (69.4 kg)  10/25/15 151 lb 12.8 oz (68.856 kg)  10/22/15 151 lb (68.493 kg)  10/12/15 156 lb 12 oz (71.1 kg)  10/05/15  185 lb 13.6 oz (84.3 kg)    BMI:  Body mass index is 24.64 kg/(m^2).  Estimated Nutritional Needs:   Kcal:  2040-2380 kcals (using current wt of 68 kg as unsure of EDW)  Protein:  82-102 g  Fluid:  1000 mL plus UOP  EDUCATION NEEDS:   No education needs identified at this time  [place], [place], [place] [phone] Pager  [phone] Weekend/On-Call Pager

## 2016-02-18 NOTE — Progress Notes (Signed)
[facility]  ROUNDING NOTE   Subjective:  Patient seen and evaluated during HD.  Tolerating well.    HEMODIALYSIS FLOWSHEET:  Blood Flow Rate (mL/min): 400 mL/min Arterial Pressure (mmHg): -180 mmHg Venous Pressure (mmHg): 160 mmHg Transmembrane Pressure (mmHg): 40 mmHg Ultrafiltration Rate (mL/min): 860 mL/min Dialysate Flow Rate (mL/min): 800 ml/min Conductivity: Machine : 14 Conductivity: Machine : 14 Dialysis Fluid Bolus: Normal Saline Bolus Amount (mL): 250 mL Intra-Hemodialysis Comments: 1627ml.Marland KitchenMarland KitchenResting with eyes closed.    Objective:  Vital signs in last 24 hours:  Temp:  [97.7 F (36.5 C)-98.2 F (36.8 C)] 97.8 F (36.6 C) (05/29 1015) Pulse Rate:  [55-77] 74 (05/29 1230) Resp:  [16-23] 17 (05/29 1230) BP: (123-173)/(51-68) 152/55 mmHg (05/29 1230) SpO2:  [96 %-99 %] 98 % (05/29 1015)  Weight change:  Filed Weights   02/16/16 0820  Weight: 68.221 kg (150 lb 6.4 oz)    Intake/Output: I/O last 3 completed shifts: In: 540 [P.O.:540] Out: 0    Intake/Output this shift:     Physical Exam: General: NAD, resting in bed  Head: Normocephalic, atraumatic. Moist oral mucosal membranes  Eyes: Anicteric  Neck: Supple, trachea midline  Lungs:  Diminished at bases otherwise clear  Heart: S1S2 no rubs  Abdomen:  Mild upper quadrant tenderness, no rebound, BS present  Extremities: 1+ peripheral edema  Neurologic: Nonfocal, moving all four extremities  Skin: No lesions  Access: LUE AVG    Basic Metabolic Panel:  Recent Labs Lab 02/16/16 0819 02/17/16 0747 02/18/16 1048  NA 135 132*  --   K 4.7 5.2*  --   CL 97* 96*  --   CO2 28 27  --   GLUCOSE 69 74  --   BUN 18 26*  --   CREATININE 3.54* 4.62*  --   CALCIUM 8.8* 8.8*  --   PHOS  --   --  5.5*    Liver Function Tests:  Recent Labs Lab 02/16/16 0819 02/17/16 0747  AST 31 19  ALT 16 11*  ALKPHOS 116 111  BILITOT 0.6 0.6  PROT 7.4 7.3  ALBUMIN 3.1* 2.9*    Recent  Labs Lab 02/16/16 0819 02/17/16 0747  LIPASE 58* 19   No results for input(s): AMMONIA in the last 168 hours.  CBC:  Recent Labs Lab 02/16/16 0819 02/17/16 0747  WBC 7.8 7.3  HGB 11.6* 11.5*  HCT 36.6 36.3  MCV 82.0 83.4  PLT 223 242    Cardiac Enzymes:  Recent Labs Lab 02/16/16 0819  TROPONINI 0.05*    BNP: Invalid input(s): POCBNP  CBG:  Recent Labs Lab 02/18/16 0110 02/18/16 0147 02/18/16 0225 02/18/16 0355 02/18/16 0738  GLUCAP 79* 72 101* 17 101*    Microbiology: Results for orders placed or performed during the hospital encounter of 02/16/16  MRSA PCR Screening     Status: None   Collection Time: 02/16/16  7:00 PM  Result Value Ref Range Status   MRSA by PCR NEGATIVE NEGATIVE Final    Comment:        The GeneXpert MRSA Assay (FDA approved for NASAL specimens only), is one component of a comprehensive MRSA colonization surveillance program. It is not intended to diagnose MRSA infection nor to guide or monitor treatment for MRSA infections.   Culture, body fluid-bottle     Status: None (Preliminary result)   Collection Time: 02/17/16  3:05 PM  Result Value Ref Range Status   Specimen Description PLEURAL  Final   Special Requests NONE  Final   Gram Stain MANY WBC SEEN NO ORGANISMS SEEN   Final   Culture NO GROWTH < 24 HOURS  Final   Report Status PENDING  Incomplete    Coagulation Studies: No results for input(s): LABPROT, INR in the last 72 hours.  Urinalysis: No results for input(s): COLORURINE, LABSPEC, PHURINE, GLUCOSEU, HGBUR, BILIRUBINUR, KETONESUR, PROTEINUR, UROBILINOGEN, NITRITE, LEUKOCYTESUR in the last 72 hours.  Invalid input(s): APPERANCEUR    Imaging: Dg Thoracic Spine 2 View  02/17/2016  CLINICAL DATA:  Upper back pain.  Chest pain. EXAM: THORACIC SPINE 2 VIEWS COMPARISON:  02/16/2016 and 11/21/2015 FINDINGS: Stent in the left subclavian graft. Indistinct interstitial opacity in the lungs with bilateral pleural  effusions and thickening of the fissures. Interbody and interspinous fusion at T11-12. No thoracic spine fracture identified. No subluxation observed. Atherosclerotic calcifications in the upper abdomen. IMPRESSION: 1. Suspected pulmonary edema and small bilateral pleural effusions. 2. Interbody and interspinous fusion at T11-12. Electronically Signed   By: Van Clines M.D.   On: 02/17/2016 12:30   Ct Chest Wo Contrast  02/17/2016  CLINICAL DATA:  55 year old female status post thoracentesis. Bilateral pleural effusions. EXAM: CT CHEST WITHOUT CONTRAST TECHNIQUE: Multidetector CT imaging of the chest was performed following the standard protocol without IV contrast. COMPARISON:  Chest CT 11/21/2015. FINDINGS: Mediastinum/Lymph Nodes: Heart size is normal. There is no significant pericardial fluid, thickening or pericardial calcification. There is atherosclerosis of the thoracic aorta, the great vessels of the mediastinum and the coronary arteries, including calcified atherosclerotic plaque in the left main, left anterior descending, left circumflex and right coronary arteries. Multiple prominent borderline enlarged mediastinal and hilar lymph nodes. Mildly enlarged prevascular lymph node measuring up to 11 mm in short axis. Esophagus is unremarkable in appearance. No axillary lymphadenopathy. Vascular stent in the left axillary and subclavian vein. Lungs/Pleura: Mild diffuse interlobular septal thickening and a few patchy areas of very mild ground-glass attenuation, suggesting a background of mild pulmonary edema. No confluent consolidative airspace disease. Small bilateral pleural effusions, which appear at least partially loculated, particularly in the left hemithorax. No pneumothorax. Areas of mild scarring and/or atelectasis throughout the periphery of the lung bases bilaterally. Upper Abdomen: Unremarkable. Musculoskeletal/Soft Tissues: There are no aggressive appearing lytic or blastic lesions noted  in the visualized portions of the skeleton. IMPRESSION: 1. The appearance of the chest suggests mild congestive heart failure, as above. 2. Associated with this there are small bilateral pleural effusions, which appear at least partially loculated particularly on the left side. 3. No pneumothorax following thoracentesis. 4. Multiple borderline enlarged and minimally enlarged mediastinal and bilateral hilar lymph nodes, nonspecific, but likely reactive. 5. Atherosclerosis, including left main and 3 vessel coronary artery disease. Please note that although the presence of coronary artery calcium documents the presence of coronary artery disease, the severity of this disease and any potential stenosis cannot be assessed on this non-gated CT examination. Assessment for potential risk factor modification, dietary therapy or pharmacologic therapy may be warranted, if clinically indicated. 6. Additional incidental findings, as above. Electronically Signed   By: Vinnie Langton M.D.   On: 02/17/2016 15:50   Dg Chest Bilateral Decubitus  02/16/2016  CLINICAL DATA:  Patient with history of pleural effusion. EXAM: CHEST - BILATERAL DECUBITUS VIEW COMPARISON:  Chest radiograph earlier same day. FINDINGS: Bilateral decubitus images were obtained demonstrating small layering bilateral pleural effusions. Cardiomegaly. Diffuse bilateral perihilar interstitial pulmonary opacities compatible with edema. Stent graft material overlying the left axilla. No pneumothorax. IMPRESSION: Small layering  bilateral pleural effusions. Cardiomegaly and interstitial pulmonary edema. Electronically Signed   By: Lovey Newcomer M.D.   On: 02/16/2016 16:37   Ct Abdomen Pelvis W Contrast  02/16/2016  CLINICAL DATA:  Abdominal pain.  Increasing abdominal to the touch. EXAM: CT ABDOMEN AND PELVIS WITH CONTRAST TECHNIQUE: Multidetector CT imaging of the abdomen and pelvis was performed using the standard protocol following bolus administration of  intravenous contrast. CONTRAST:  124mL ISOVUE-300 IOPAMIDOL (ISOVUE-300) INJECTION 61% COMPARISON:  11/12/2015 FINDINGS: Lower chest: Loculated bilateral small pleural effusions, right greater than left. Normal heart size. Hepatobiliary: Normal liver.  Prior cholecystectomy. Pancreas: Normal. Spleen: Normal. Adrenals/Urinary Tract: Normal adrenal glands. Bilateral atrophic kidneys. No urolithiasis or obstructive uropathy. Normal bladder. Stomach/Bowel: No bowel wall thickening or bowel dilatation. No pneumatosis, pneumoperitoneum or portal venous gas. Moderate amount of stool throughout the colon. No abdominal or pelvic free fluid. Vascular/Lymphatic: Normal caliber abdominal aorta. Abdominal aortic atherosclerosis. Diffuse atherosclerosis of the superior mesenteric artery inferior mesenteric artery. No lymphadenopathy. Reproductive: Prior hysterectomy.  No adnexal mass. Other: No fluid collection or hematoma.  Diffuse anasarca. Musculoskeletal: No acute osseous abnormality. No lytic or sclerotic osseous lesion. IMPRESSION: 1. Diffuse anasarca. 2. Loculated bilateral small pleural effusions, right greater than left. 3. No bowel obstruction. 4. Previous demonstrated small enhancing right renal masses are not appreciated on the current examination and may be secondary to the phase of imaging. Consider nonemergent abdomen MRI without contrast for further evaluation. Electronically Signed   By: Kathreen Devoid   On: 02/16/2016 13:48   US Thoracentesis Asp Pleural Space W/img Guide  02/17/2016  CLINICAL DATA:  Chest pain, loculated pleural effusion EXAM: EXAM THORACENTESIS WITH ULTRASOUND TECHNIQUE: The procedure, risks (including but not limited to bleeding, infection, organ damage ), benefits, and alternatives were explained to the patient. Questions regarding the procedure were encouraged and answered. The patient understands and consents to the procedure. Survey ultrasound of the right hemithorax was performed and an  appropriate skin entry site was localized. Site was marked, prepped with chlorhexidine, draped in usual sterile fashion, infiltrated locally with 1% lidocaine. The Saf-T-Centesis needle was advanced into the pleural space. Cloudy fluid returned. 300 mL was removed. Samples sent for requested studies. The patient tolerated procedure well. COMPLICATIONS: COMPLICATIONS None immediate IMPRESSION: Technically successful ultrasound-guided right thoracentesis. Follow-up chest CT pending. Electronically Signed   By: Lucrezia Europe M.D.   On: 02/17/2016 17:02     Medications:     . acidophilus  1 capsule Oral Daily  . amLODipine  10 mg Oral Daily  . aspirin EC  81 mg Oral BH-q7a  . atorvastatin  20 mg Oral QPM  . citalopram  20 mg Oral BH-q7a  . clopidogrel  75 mg Oral Daily  . heparin subcutaneous  5,000 Units Subcutaneous Q8H  . hydrALAZINE  25 mg Oral TID  . insulin aspart  0-5 Units Subcutaneous QHS  . insulin aspart  0-9 Units Subcutaneous TID WC  . lipase/protease/amylase  12,000 Units Oral TID WC  . loratadine  10 mg Oral Daily  . metoCLOPramide  10 mg Oral TID AC  . multivitamin  1 tablet Oral Daily  . omega-3 acid ethyl esters  1 g Oral Daily  . pantoprazole  40 mg Oral Daily  . patiromer  8.4 g Oral Daily  . ranolazine  1,000 mg Oral BID  . sevelamer carbonate  800 mg Oral TID WC  . vitamin B-12  1,000 mcg Oral Daily   sodium chloride, sodium chloride, acetaminophen **  OR** acetaminophen, albuterol, alteplase, heparin, HYDROcodone-acetaminophen, lidocaine (PF), lidocaine-prilocaine, nitroGLYCERIN, pentafluoroprop-tetrafluoroeth, promethazine, senna-docusate  Assessment/ Plan:  55 y.o. 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  Also with hx of bilateral renal masses.  [facility] MWF [facility]  1.  End-stage renal disease on hemodialysis Monday, Wednesday, Friday.  Patient seen and evaluated during hemodialysis. Currently tolerating well. Complete dialysis today.  2. Hyperkalemia. Patient has had multiple bouts of hyperkalemia. Therefore we will start the patient on patiromer 8.4g po daily.  Patient also receiving dialysis today.  3. Anemia of chronic kidney disease. Given prior noted renal mass we have been avoiding Epogen. Continue to monitor CBC.  Hemoglobin currently acceptable at 11.5.  4. Secondary hyperparathyroidism. Phosphorus currently 5.5 and acceptable. Continue Renvela.   5. Bilateral pleural effusions. Patient status post thoracentesis. Appreciate pulmonary input.  6. Right renal masses. These were not evident on the most recent CT scan. The patient has outpatient urology follow-up for this issue. We are holding Epogen as above.     LOS: 2 Vicente Weidler 5/29/20171:10 PM

## 2016-02-18 NOTE — Progress Notes (Signed)
Hemodialysis completed. 

## 2016-02-18 NOTE — Care Management (Addendum)
Patient presents from home with shortness of breath.  She has home 02 through [facility] that she uses at night.  She is esrd with chronic dialysis.  She currently has [facility] home health services- SN PT in the home.  Obtained order for physical therapy so patient's functional status does not decline while inpatient.  Checking with [facility] to confirm whether current 02 order is for continuous or nocturnal.  Notified [facility] of admission.  Had thoracentesis and 300 cc fluid removed.  [facility] order for 02 is continuous.  Patient has 'self filled" tanks.  has a walker and cane at home

## 2016-02-18 NOTE — Progress Notes (Addendum)
Pt BS at 101.

## 2016-02-18 NOTE — Progress Notes (Signed)
Hemodialysis treatment started. 

## 2016-02-19 LAB — PH, BODY FLUID: pH, Body Fluid: 7.6

## 2016-02-19 LAB — LACTATE DEHYDROGENASE, PLEURAL OR PERITONEAL FLUID: LD, Fluid: 81 U/L — ABNORMAL HIGH (ref 3–23)

## 2016-02-19 LAB — GLUCOSE, CAPILLARY
Glucose-Capillary: 53 mg/dL — ABNORMAL LOW (ref 65–99)
Glucose-Capillary: 68 mg/dL (ref 65–99)
Glucose-Capillary: 59 mg/dL — ABNORMAL LOW (ref 65–99)
Glucose-Capillary: 138 mg/dL — ABNORMAL HIGH (ref 65–99)
Glucose-Capillary: 149 mg/dL — ABNORMAL HIGH (ref 65–99)
Glucose-Capillary: 116 mg/dL — ABNORMAL HIGH (ref 65–99)
Glucose-Capillary: 125 mg/dL — ABNORMAL HIGH (ref 65–99)

## 2016-02-19 LAB — HEPATITIS B SURFACE ANTIGEN: Hepatitis B Surface Ag: NEGATIVE

## 2016-02-19 LAB — PARATHYROID HORMONE, INTACT (NO CA): PTH: 123 pg/mL — ABNORMAL HIGH (ref 15–65)

## 2016-02-19 MED ORDER — DEXTROSE 50 % IV SOLN
INTRAVENOUS | Status: AC
  Administered 2016-02-19: 50 mL
  Filled 2016-02-19: qty 50

## 2016-02-19 NOTE — Discharge Summary (Signed)
[facility] NAME: [name]    MR#:  [id number]  DATE OF BIRTH:  1961/06/05  DATE OF ADMISSION:  02/16/2016 ADMITTING PHYSICIAN: Bettey Costa, MD  DATE OF DISCHARGE: 02/19/2016  PRIMARY CARE PHYSICIAN: Ellamae Sia, MD    ADMISSION DIAGNOSIS:  Idiopathic acute pancreatitis, unspecified complication status [id number]  DISCHARGE DIAGNOSIS:  Active Problems:   Abdominal pain   SECONDARY DIAGNOSIS:   Past Medical History  Diagnosis Date  . Asthma   . Collagen vascular disease ([place])   . Diabetes mellitus without complication ([place])   . Hypertension   . Coronary artery disease     a. cath 2013: stenting to RCA (report not available); b. cath 2014: LM nl, pLAD 40%, mLAD nl, ost LCx 40%, mid LCx nl, pRCA 30% @ site of prior stent, mRCA 50%  . Diabetic neuropathy ([place])   . ESRD (end stage renal disease) on dialysis ([place])     M-W-F  . GERD (gastroesophageal reflux disease)   . Hx of pancreatitis 2015  . Myocardial infarction ([place])   . Pneumonia   . Mitral regurgitation     a. echo 10/2013: EF 62%, noWMA, mildly dilated LA, mild to mod MR/TR, GR1DD  . COPD (chronic obstructive pulmonary disease) ([place])   . chronic diastolic CHF [id number]  . dialysis 2006  . Depression   . Anxiety   . Arthritis   . Headache   . Anemia   . Renal insufficiency     HOSPITAL COURSE:   1. Abdominal pain: Patient is significantly tender on examination. She reports that this abdominal pain is different than previous episodes abdominal pain. CT scan of abdomen is without any acute finding. Chest x-ray consistent with atelectasis, bilateral pleural effusion, likely not the etiology of her abdominal pain. Lipase is slightly high, but came down on further follow up, CT abd have no findings of acute pancreatitis.  She also had tenderness on the upper thoracic vertebrae hours on local pressure, Xray thorasic spine is negative.  her pain is much  better today.  2. ESRD on hemodialysis: Consult nephrology is a patient will also be receiving IV contrast for CT scan.  As she has edema and pleural effusion with pain ,she may need dialysis.  3. ASCVD: Continue aspirin, atorvastatin, hydralazine and Plavix.  4. Essential hypertension: Continue Norvasc and hydralazine.  5. Diabetes with gastroparesis: Sliding scale insulin and Reglan.  6. Pleural effusion  Thoracentesis is done- have many WBCs.  As pt does not have Raised WBcs or fever, no need For urgent ABx.  Appreciated Dr. Laurelyn Sickle talk with me on phone, as most of the cells on Fluid is lymphocytes and pt does not have signs of infection, we don't need to give any Abx- and d/c her home with follow up in his office.  DISCHARGE CONDITIONS:   Stable.  CONSULTS OBTAINED:  Treatment Team:  Allyne Gee, MD  DRUG ALLERGIES:   Allergies  Allergen Reactions  . Compazine [Prochlorperazine Edisylate] Anaphylaxis and Nausea And Vomiting  . Ace Inhibitors Swelling  . Ativan [Lorazepam] Other (See Comments)    Reaction:  Hallucinations and headaches  . Codeine Nausea And Vomiting  . Dilaudid [Hydromorphone Hcl] Other (See Comments)    Delirium  . Gabapentin Other (See Comments)    Reaction:  Unknown   . Losartan Other (See Comments)    Reaction:  Unknown   . Ondansetron Other (See Comments)    Reaction:  Unknown   . Prochlorperazine Other (See Comments)    Reaction:  Unknown   . Scopolamine Other (See Comments)    Reaction:  Unknown   . Zofran [Ondansetron Hcl] Other (See Comments)    Reaction:  hallucinations   . Oxycodone Anxiety  . Tape Rash    DISCHARGE MEDICATIONS:   Current Discharge Medication List    CONTINUE these medications which have NOT CHANGED   Details  acidophilus (RISAQUAD) CAPS capsule Take 1 capsule by mouth daily.    albuterol (PROVENTIL) (2.5 MG/3ML) 0.083% nebulizer solution Take 2.5 mg by nebulization every 4 (four) hours as needed for  wheezing or shortness of breath.    amLODipine (NORVASC) 10 MG tablet Take 10 mg by mouth daily.    aspirin EC 81 MG tablet Take 81 mg by mouth every morning.     atorvastatin (LIPITOR) 20 MG tablet Take 20 mg by mouth every evening.    cetirizine (ZYRTEC) 10 MG tablet Take 10 mg by mouth daily.    citalopram (CELEXA) 20 MG tablet Take 20 mg by mouth every morning.     clopidogrel (PLAVIX) 75 MG tablet Take 75 mg by mouth daily. Reported on 12/17/2015    hydrALAZINE (APRESOLINE) 25 MG tablet Take 1 tablet (25 mg total) by mouth every 8 (eight) hours.    HYDROcodone-acetaminophen (NORCO/VICODIN) 5-325 MG tablet Take 1 tablet by mouth every 6 (six) hours as needed for moderate pain or severe pain.    lipase/protease/amylase (CREON) 12000 UNITS CPEP capsule Take 12,000 Units by mouth 3 (three) times daily with meals.     metoCLOPramide (REGLAN) 10 MG tablet Take 1 tablet (10 mg total) by mouth 3 (three) times daily before meals. Qty: 90 tablet, Refills: 0    multivitamin (RENA-VIT) TABS tablet Take 1 tablet by mouth daily.    nitroGLYCERIN (NITROSTAT) 0.4 MG SL tablet Place 0.4 mg under the tongue every 5 (five) minutes x 3 doses as needed for chest pain. *Max 3 doses per episode*    omega-3 acid ethyl esters (LOVAZA) 1 g capsule Take 1 g by mouth daily.    promethazine (PHENERGAN) 25 MG tablet Take 25 mg by mouth every 6 (six) hours as needed for nausea or vomiting.    ranitidine (ZANTAC) 150 MG tablet Take 150 mg by mouth every evening.     ranolazine (RANEXA) 1000 MG SR tablet Take 1,000 mg by mouth 2 (two) times daily.    sevelamer carbonate (RENVELA) 800 MG tablet Take 800 mg by mouth 3 (three) times daily with meals.     vitamin B-12 (CYANOCOBALAMIN) 1000 MCG tablet Take 1,000 mcg by mouth daily.    bisacodyl (DULCOLAX) 5 MG EC tablet Take 1 tablet (5 mg total) by mouth daily as needed for moderate constipation. Qty: 30 tablet, Refills: 0    oxymetazoline (AFRIN) 0.05 %  nasal spray Place 2 sprays into both nostrils 2 (two) times daily. Qty: 15 mL, Refills: 0         DISCHARGE INSTRUCTIONS:    Follow with Dr. Humphrey Rolls in office in 2 weeks.   If you experience worsening of your admission symptoms, develop shortness of breath, life threatening emergency, suicidal or homicidal thoughts you must seek medical attention immediately by calling 911 or calling your MD immediately  if symptoms less severe.  You Must read complete instructions/literature along with all the possible adverse reactions/side effects for all the Medicines you take and that have been prescribed to you. Take any new Medicines  after you have completely understood and accept all the possible adverse reactions/side effects.   Please note  You were cared for by a hospitalist during your hospital stay. If you have any questions about your discharge medications or the care you received while you were in the hospital after you are discharged, you can call the unit and asked to speak with the hospitalist on call if the hospitalist that took care of you is not available. Once you are discharged, your primary care physician will handle any further medical issues. Please note that NO REFILLS for any discharge medications will be authorized once you are discharged, as it is imperative that you return to your primary care physician (or establish a relationship with a primary care physician if you do not have one) for your aftercare needs so that they can reassess your need for medications and monitor your lab values.    Today   CHIEF COMPLAINT:   Chief Complaint  Patient presents with  . Abdominal Pain    HISTORY OF PRESENT ILLNESS:  Manica Cassiano  is a 55 y.o. female with a known history of ESRD on dialysis Monday, Wednesday and Friday, diabetes and asthma who presents with abdominal pain. Patient reports since Wednesday she's had increasing abdominal pain to the point that now it is very tender to  touch. She reports that when she takes a deep breath then she has intense abdominal pain. She denies dark colored stools. She denies shortness of breath, nausea, vomiting or fevers. Her abdominal pain is generalized although worse in the upper epigastric and right upper quadrant region. It is currently 8/10. She was most recently hospitalized at the end of March for right upper quad abdominal pain however this was associated with nausea and vomiting. She feels that this abdominal pain is different than previous episodes of abdominal pain.  VITAL SIGNS:  Blood pressure 127/49, pulse 52, temperature 97.9 F (36.6 C), temperature source Oral, resp. rate 18, height 5' 5.5" (1.664 m), weight 68.221 kg (150 lb 6.4 oz), SpO2 100 %.  I/O:   Intake/Output Summary (Last 24 hours) at 02/19/16 1708 Last data filed at 02/19/16 0900  Gross per 24 hour  Intake    420 ml  Output      0 ml  Net    420 ml    PHYSICAL EXAMINATION:   GENERAL: 55 y.o.-year-old patient lying in the bed with no acute distress.  EYES: Pupils equal, round, reactive to light and accommodation. No scleral icterus. Extraocular muscles intact.  HEENT: Head atraumatic, normocephalic. Oropharynx and nasopharynx clear.  NECK: Supple, no jugular venous distention. No thyroid enlargement, no tenderness.  LUNGS: Normal breath sounds bilaterally, no wheezing, rales,rhonchi or crepitation. No use of accessory muscles of respiration.  CARDIOVASCULAR: S1, S2 normal. No murmurs, rubs, or gallops.  ABDOMEN: Soft, epigastric tenderness , nondistended. Bowel sounds present. No organomegaly or mass. Tenderness present on palpation of the thoracic vertebra EXTREMITIES: Positive pedal edema, no cyanosis, or clubbing. AV graft on left arm. NEUROLOGIC: Cranial nerves II through XII are intact. Muscle strength 5/5 in all extremities. Sensation intact. Gait not checked.  PSYCHIATRIC: The patient is alert and oriented x 3.  SKIN: No obvious  rash, lesion, or ulcer.    DATA REVIEW:   CBC  Recent Labs Lab 02/17/16 0747  WBC 7.3  HGB 11.5*  HCT 36.3  PLT 242    Chemistries   Recent Labs Lab 02/17/16 0747  NA 132*  K 5.2*  CL  96*  CO2 27  GLUCOSE 74  BUN 26*  CREATININE 4.62*  CALCIUM 8.8*  AST 19  ALT 11*  ALKPHOS 111  BILITOT 0.6    Cardiac Enzymes  Recent Labs Lab 02/16/16 0819  TROPONINI 0.05*    Microbiology Results  Results for orders placed or performed during the hospital encounter of 02/16/16  MRSA PCR Screening     Status: None   Collection Time: 02/16/16  7:00 PM  Result Value Ref Range Status   MRSA by PCR NEGATIVE NEGATIVE Final    Comment:        The GeneXpert MRSA Assay (FDA approved for NASAL specimens only), is one component of a comprehensive MRSA colonization surveillance program. It is not intended to diagnose MRSA infection nor to guide or monitor treatment for MRSA infections.   Culture, body fluid-bottle     Status: None (Preliminary result)   Collection Time: 02/17/16  3:05 PM  Result Value Ref Range Status   Specimen Description PLEURAL  Final   Special Requests NONE  Final   Gram Stain MANY WBC SEEN NO ORGANISMS SEEN   Final   Culture NO GROWTH 2 DAYS  Final   Report Status PENDING  Incomplete    RADIOLOGY:  No results found.   Management plans discussed with the patient, family and they are in agreement.  CODE STATUS:     Code Status Orders        Start     Ordered   02/16/16 1409  Full code   Continuous     02/16/16 1409    Code Status History    Date Active Date Inactive Code Status Order ID Comments User Context   12/17/2015 11:39 AM 12/21/2015 10:09 PM Full Code [id number]  Dustin Flock, MD ED   11/12/2015  4:02 PM 12/01/2015  7:43 PM Full Code [id number]  Bettey Costa, MD Inpatient   10/07/2015  9:37 AM 10/12/2015  7:59 PM Full Code [id number]  Fritzi Mandes, MD ED   10/03/2015  3:55 AM 10/05/2015 10:30 PM Full Code [id number]  Lance Coon, MD  Inpatient   08/31/2015  4:00 PM 09/03/2015 10:09 PM Full Code [id number]  Demetrios Loll, MD ED   07/26/2015  3:28 PM 07/26/2015  9:36 PM Full Code [id number]  Dionisio David, MD Inpatient   07/22/2015  5:36 PM 07/26/2015  3:28 PM Full Code [id number]  Vaughan Basta, MD Inpatient   06/05/2015  8:39 PM 06/07/2015  8:03 PM Full Code [id number]  Bettey Costa, MD Inpatient   05/21/2015  9:42 PM 05/24/2015  4:26 PM Full Code [id number]  Henreitta Leber, MD Inpatient   04/24/2015  8:55 AM 04/26/2015 10:11 PM Full Code [id number]  Demetrios Loll, MD Inpatient   04/01/2015  3:58 PM 04/05/2015  8:07 PM Full Code [id number]  Epifanio Lesches, MD ED   03/06/2015  8:05 AM 03/09/2015  9:11 PM Full Code [id number]  Juluis Mire, MD Inpatient   02/19/2015  4:14 PM 02/24/2015  5:41 PM Full Code [id number]  Aldean Jewett, MD Inpatient      TOTAL TIME TAKING CARE OF THIS PATIENT: 35 minutes.    Vaughan Basta M.D on 02/19/2016 at 5:08 PM  Between 7am to 6pm - Pager - [phone]  After 6pm go to [email or web address] - password [email or web address] [facility]  Office  [phone]  CC: Primary care physician; Ellamae Sia, MD   Note: This dictation was prepared with Dragon dictation along with smaller phrase  technology. Any transcriptional errors that result from this process are unintentional.

## 2016-02-19 NOTE — Progress Notes (Signed)
Pt stable. IV removed. D/c instructions given and education provided. Signed prescriptions verified and given. Pt states she understands instructions. Pt dressed and escorted out by staff. Driven home by family.  

## 2016-02-19 NOTE — Progress Notes (Signed)
Notified Dr. Judd Gaudier concerning patient's needs for [place] recommended by PT. MD acknowledged and will place new orders.

## 2016-02-19 NOTE — Progress Notes (Signed)
Pt BS at 53 this AM, one cup orange juice plus 5 packs of sugar as requested by Pt. given. BS at 68 after 30 mins and one cup of apple juice given. Pt usually asks for apple juice in these situation.

## 2016-02-19 NOTE — Evaluation (Signed)
Physical Therapy Evaluation Patient Details Name: MAUD SCARBRO MRN: [id number] DOB: 03-14-1961 Today's Date: 02/19/2016   History of Present Illness  55 y/o female here with abdominal, chest and low back pain. She is generally feeling better and eager to go home.  Pt is on dialysis M,W,F.  Clinical Impression  Pt did well with PT exam and though she is somewhat slower and more cautious with her ambulation than at baseline she ultimately will be safe to return home with her son.  She would benefit from continued work with HHPT for ambulation, strength and general mobility/safety.  Pt is eager to return home, she feels confident that she could do steps despite not wanting to do them here in the hospital.     Follow Up Recommendations Home health PT    Equipment Recommendations       Recommendations for Other Services       Precautions / Restrictions Precautions Precautions: Fall Restrictions Weight Bearing Restrictions: No      Mobility  Bed Mobility Overal bed mobility: Independent             General bed mobility comments: Pt is able to get up to sitting EOB w/o assist; slow but independent getting back to supine after walking.   Transfers Overall transfer level: Independent Equipment used: Rolling walker (2 wheeled)             General transfer comment: Pt able to rise w/o excessive UE use, does need walker for stability in standing  Ambulation/Gait Ambulation/Gait assistance: Supervision Ambulation Distance (Feet): 100 Feet Assistive device: Rolling walker (2 wheeled);1 person hand held assist       General Gait Details: Pt is slow, but consistent with ambulation and though she does need some assist (rail, hand held assist) she is not overly reliant on UE support.  Pt is safe and reports that though she is slower than her baseline she does not feel unsafe.  Stairs            Wheelchair Mobility    Modified Rankin (Stroke Patients Only)        Balance                                             Pertinent Vitals/Pain Pain Assessment: No/denies pain    Home Living Family/patient expects to be discharged to:: Private residence Living Arrangements: Children Available Help at Discharge: Available PRN/intermittently;Family Type of Home: Apartment Home Access: Stairs to enter Entrance Stairs-Rails: Can reach both Entrance Stairs-Number of Steps: 4          Prior Function Level of Independence: Needs assistance         Comments: Pt reports that she is able to use her SPC and do in home and limited OOH activities.  She does take the van to HD 3x/wk.     Hand Dominance        Extremity/Trunk Assessment   Upper Extremity Assessment: Generalized weakness;Overall [facility] for tasks assessed           Lower Extremity Assessment: Generalized weakness;Overall WFL for tasks assessed         Communication   Communication: No difficulties  Cognition Arousal/Alertness: Awake/alert Behavior During Therapy: WFL for tasks assessed/performed Overall Cognitive Status: Within Functional Limits for tasks assessed  General Comments      Exercises        Assessment/Plan    PT Assessment Patient needs continued PT services  PT Diagnosis Difficulty walking;Generalized weakness   PT Problem List Decreased strength;Decreased range of motion;Decreased activity tolerance;Decreased balance;Decreased mobility;Decreased safety awareness  PT Treatment Interventions Gait training;Stair training;Functional mobility training;DME instruction;Therapeutic activities;Therapeutic exercise;Balance training;Neuromuscular re-education   PT Goals (Current goals can be found in the Care Plan section) Acute Rehab PT Goals Patient Stated Goal: go home PT Goal Formulation: With patient Time For Goal Achievement: 03/04/16 Potential to Achieve Goals: Good    Frequency Min 2X/week   Barriers  to discharge        Co-evaluation               End of Session Equipment Utilized During Treatment: Gait belt Activity Tolerance: Patient tolerated treatment well;Patient limited by fatigue Patient left: with bed alarm set;with call bell/phone within reach           Time: [id number] PT Time Calculation (min) (ACUTE ONLY): 20 min   Charges:   PT Evaluation $PT Eval Low Complexity: 1 Procedure     PT G CodesKreg Shropshire, DPT 02/19/2016, 3:55 PM

## 2016-02-19 NOTE — Care Management (Signed)
Spoke with attending regarding discharge disposition.  There was concern of wbc findings in fluid from thoracentesis but patient there is no elevation of serum WBC and without temp.  Attending has spoken with pulmonology - Dr Humphrey Rolls and he will assess patient today.  It is anticipated patient will discharge after that.  Informed patient and patient will let [facility] know to transport her from her home tomorrow  to dialysis.  Notified [facility].

## 2016-02-19 NOTE — Progress Notes (Signed)
Pt BS back down to 54 after one cup of orange and apple juice. Per protocol Dextrose 50, 25 mls admin. Oncoming staff will continue to monitor.

## 2016-02-19 NOTE — Progress Notes (Signed)
[facility]  ROUNDING NOTE   Subjective:   Patient breathing better. Ultrasound guided thoracenteis on 5/28 showing 2962 wbc.   Hemodialysis yesterday. Tolerated treatment well.    Objective:  Vital signs in last 24 hours:  Temp:  [98 F (36.7 C)-98.4 F (36.9 C)] 98.4 F (36.9 C) (05/29 2128) Pulse Rate:  [57-76] 57 (05/29 2128) Resp:  [16-17] 16 (05/29 2128) BP: (114-155)/(40-57) 114/40 mmHg (05/29 2128) SpO2:  [97 %] 97 % (05/29 2128)  Weight change:  Filed Weights   02/16/16 0820  Weight: 68.221 kg (150 lb 6.4 oz)    Intake/Output: I/O last 3 completed shifts: In: 300 [P.O.:300] Out: 2500 [Other:2500]   Intake/Output this shift:  Total I/O In: 120 [P.O.:120] Out: -   Physical Exam: General: NAD, sitting in chair  Head: Normocephalic, atraumatic. Moist oral mucosal membranes  Eyes: Anicteric  Neck: Supple, trachea midline  Lungs:  Diminished at bases otherwise clear  Heart: S1S2 no rubs  Abdomen:  Mild upper quadrant tenderness, no rebound, BS present  Extremities: no peripheral edema  Neurologic: Nonfocal, moving all four extremities  Skin: No lesions  Access: LUE AVG    Basic Metabolic Panel:  Recent Labs Lab 02/16/16 0819 02/17/16 0747 02/18/16 1048  NA 135 132*  --   K 4.7 5.2*  --   CL 97* 96*  --   CO2 28 27  --   GLUCOSE 69 74  --   BUN 18 26*  --   CREATININE 3.54* 4.62*  --   CALCIUM 8.8* 8.8*  --   PHOS  --   --  5.5*    Liver Function Tests:  Recent Labs Lab 02/16/16 0819 02/17/16 0747  AST 31 19  ALT 16 11*  ALKPHOS 116 111  BILITOT 0.6 0.6  PROT 7.4 7.3  ALBUMIN 3.1* 2.9*    Recent Labs Lab 02/16/16 0819 02/17/16 0747  LIPASE 58* 19   No results for input(s): AMMONIA in the last 168 hours.  CBC:  Recent Labs Lab 02/16/16 0819 02/17/16 0747  WBC 7.8 7.3  HGB 11.6* 11.5*  HCT 36.6 36.3  MCV 82.0 83.4  PLT 223 242    Cardiac Enzymes:  Recent Labs Lab 02/16/16 0819  TROPONINI 0.05*     BNP: Invalid input(s): POCBNP  CBG:  Recent Labs Lab 02/19/16 0550 02/19/16 0625 02/19/16 0649 02/19/16 0734 02/19/16 1137  GLUCAP 53* 68 9* 138* 149*    Microbiology: Results for orders placed or performed during the hospital encounter of 02/16/16  MRSA PCR Screening     Status: None   Collection Time: 02/16/16  7:00 PM  Result Value Ref Range Status   MRSA by PCR NEGATIVE NEGATIVE Final    Comment:        The GeneXpert MRSA Assay (FDA approved for NASAL specimens only), is one component of a comprehensive MRSA colonization surveillance program. It is not intended to diagnose MRSA infection nor to guide or monitor treatment for MRSA infections.   Culture, body fluid-bottle     Status: None (Preliminary result)   Collection Time: 02/17/16  3:05 PM  Result Value Ref Range Status   Specimen Description PLEURAL  Final   Special Requests NONE  Final   Gram Stain MANY WBC SEEN NO ORGANISMS SEEN   Final   Culture NO GROWTH 2 DAYS  Final   Report Status PENDING  Incomplete    Coagulation Studies: No results for input(s): LABPROT, INR in the last 72 hours.  Urinalysis: No results for input(s): COLORURINE, LABSPEC, PHURINE, GLUCOSEU, HGBUR, BILIRUBINUR, KETONESUR, PROTEINUR, UROBILINOGEN, NITRITE, LEUKOCYTESUR in the last 72 hours.  Invalid input(s): APPERANCEUR    Imaging: Ct Chest Wo Contrast  02/17/2016  CLINICAL DATA:  55 year old female status post thoracentesis. Bilateral pleural effusions. EXAM: CT CHEST WITHOUT CONTRAST TECHNIQUE: Multidetector CT imaging of the chest was performed following the standard protocol without IV contrast. COMPARISON:  Chest CT 11/21/2015. FINDINGS: Mediastinum/Lymph Nodes: Heart size is normal. There is no significant pericardial fluid, thickening or pericardial calcification. There is atherosclerosis of the thoracic aorta, the great vessels of the mediastinum and the coronary arteries, including calcified atherosclerotic  plaque in the left main, left anterior descending, left circumflex and right coronary arteries. Multiple prominent borderline enlarged mediastinal and hilar lymph nodes. Mildly enlarged prevascular lymph node measuring up to 11 mm in short axis. Esophagus is unremarkable in appearance. No axillary lymphadenopathy. Vascular stent in the left axillary and subclavian vein. Lungs/Pleura: Mild diffuse interlobular septal thickening and a few patchy areas of very mild ground-glass attenuation, suggesting a background of mild pulmonary edema. No confluent consolidative airspace disease. Small bilateral pleural effusions, which appear at least partially loculated, particularly in the left hemithorax. No pneumothorax. Areas of mild scarring and/or atelectasis throughout the periphery of the lung bases bilaterally. Upper Abdomen: Unremarkable. Musculoskeletal/Soft Tissues: There are no aggressive appearing lytic or blastic lesions noted in the visualized portions of the skeleton. IMPRESSION: 1. The appearance of the chest suggests mild congestive heart failure, as above. 2. Associated with this there are small bilateral pleural effusions, which appear at least partially loculated particularly on the left side. 3. No pneumothorax following thoracentesis. 4. Multiple borderline enlarged and minimally enlarged mediastinal and bilateral hilar lymph nodes, nonspecific, but likely reactive. 5. Atherosclerosis, including left main and 3 vessel coronary artery disease. Please note that although the presence of coronary artery calcium documents the presence of coronary artery disease, the severity of this disease and any potential stenosis cannot be assessed on this non-gated CT examination. Assessment for potential risk factor modification, dietary therapy or pharmacologic therapy may be warranted, if clinically indicated. 6. Additional incidental findings, as above. Electronically Signed   By: Vinnie Langton M.D.   On: 02/17/2016  15:50   US Thoracentesis Asp Pleural Space W/img Guide  02/17/2016  CLINICAL DATA:  Chest pain, loculated pleural effusion EXAM: EXAM THORACENTESIS WITH ULTRASOUND TECHNIQUE: The procedure, risks (including but not limited to bleeding, infection, organ damage ), benefits, and alternatives were explained to the patient. Questions regarding the procedure were encouraged and answered. The patient understands and consents to the procedure. Survey ultrasound of the right hemithorax was performed and an appropriate skin entry site was localized. Site was marked, prepped with chlorhexidine, draped in usual sterile fashion, infiltrated locally with 1% lidocaine. The Saf-T-Centesis needle was advanced into the pleural space. Cloudy fluid returned. 300 mL was removed. Samples sent for requested studies. The patient tolerated procedure well. COMPLICATIONS: COMPLICATIONS None immediate IMPRESSION: Technically successful ultrasound-guided right thoracentesis. Follow-up chest CT pending. Electronically Signed   By: Lucrezia Europe M.D.   On: 02/17/2016 17:02     Medications:     . acidophilus  1 capsule Oral Daily  . amLODipine  10 mg Oral Daily  . aspirin EC  81 mg Oral BH-q7a  . atorvastatin  20 mg Oral QPM  . citalopram  20 mg Oral BH-q7a  . clopidogrel  75 mg Oral Daily  . heparin subcutaneous  5,000 Units Subcutaneous Q8H  .  hydrALAZINE  25 mg Oral TID  . insulin aspart  0-5 Units Subcutaneous QHS  . insulin aspart  0-9 Units Subcutaneous TID WC  . lipase/protease/amylase  12,000 Units Oral TID WC  . loratadine  10 mg Oral Daily  . metoCLOPramide  10 mg Oral TID AC  . multivitamin  1 tablet Oral Daily  . omega-3 acid ethyl esters  1 g Oral Daily  . pantoprazole  40 mg Oral Daily  . patiromer  8.4 g Oral Daily  . ranolazine  1,000 mg Oral BID  . sevelamer carbonate  800 mg Oral TID WC  . vitamin B-12  1,000 mcg Oral Daily   acetaminophen **OR** acetaminophen, albuterol, HYDROcodone-acetaminophen,  nitroGLYCERIN, promethazine, senna-docusate  Assessment/ Plan:  55 y.o. 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  Also with hx of bilateral renal masses.  [facility] MWF [place]  1.  End-stage renal disease with hyperkalemia.  Continue hemodialysis Monday, Wednesday, Friday.   Next treatment for tomorrow. 1 K bath - discontinue patiromer  3. Anemia of chronic kidney disease. Given prior noted renal mass we have been avoiding Epogen. Hemoglobin at goal.   4. Secondary hyperparathyroidism.  - Continue Renvela.   5. Bilateral pleural effusions. Patient status post thoracentesis 5/28. Appreciate pulmonary input.  6. Right renal masses. These were not evident on the most recent CT scan. The patient has outpatient urology follow-up for this issue. We are holding Epogen as above.     LOS: 3 Sael Furches 5/30/20172:07 PM

## 2016-02-20 LAB — CYTOLOGY - NON PAP

## 2016-02-22 LAB — CULTURE, BODY FLUID W GRAM STAIN -BOTTLE: Culture: NO GROWTH

## 2016-02-27 IMAGING — CR DG ABDOMEN 2V
2 series · 3 of 3 positions shown · non-contrast
Comparison: Chest radiographs today and 10/11/2015. Abdominal CT
07/22/2015.

CLINICAL DATA: Left-sided chest pain after finishing dialysis
today. Shortness of breath and nausea. History of diabetes,
myocardial infarction, COPD and pancreatitis.

EXAM:
ABDOMEN - 2 VIEW

[Series 1: abdomen erect · 0.14mm/px · 2 of 2 slices shown]
[im 1/2]
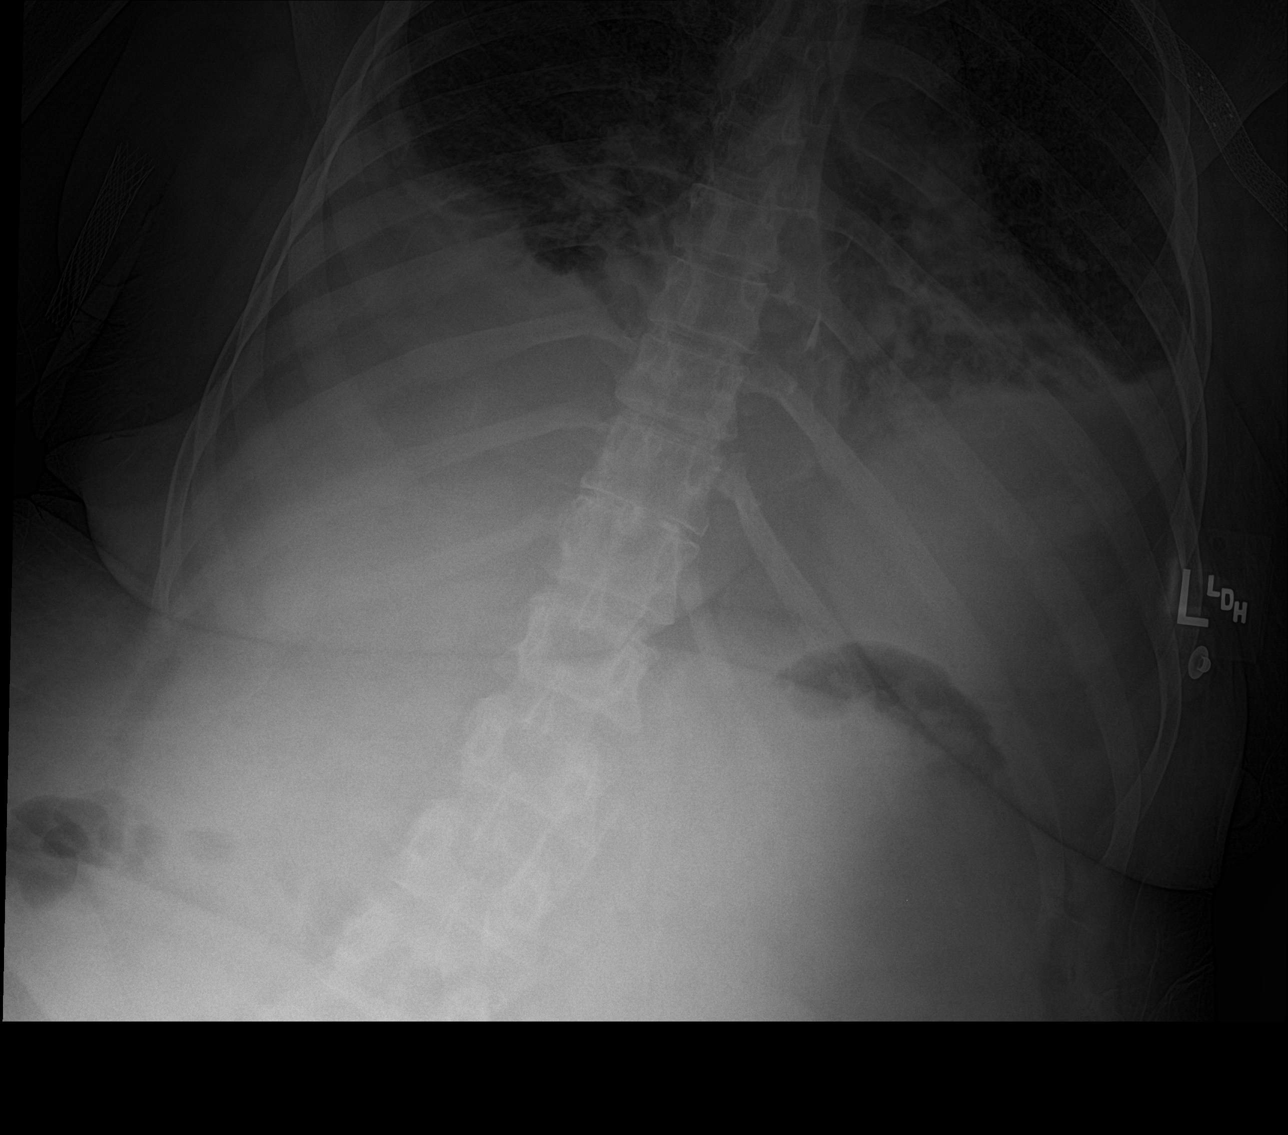
[im 2/2]
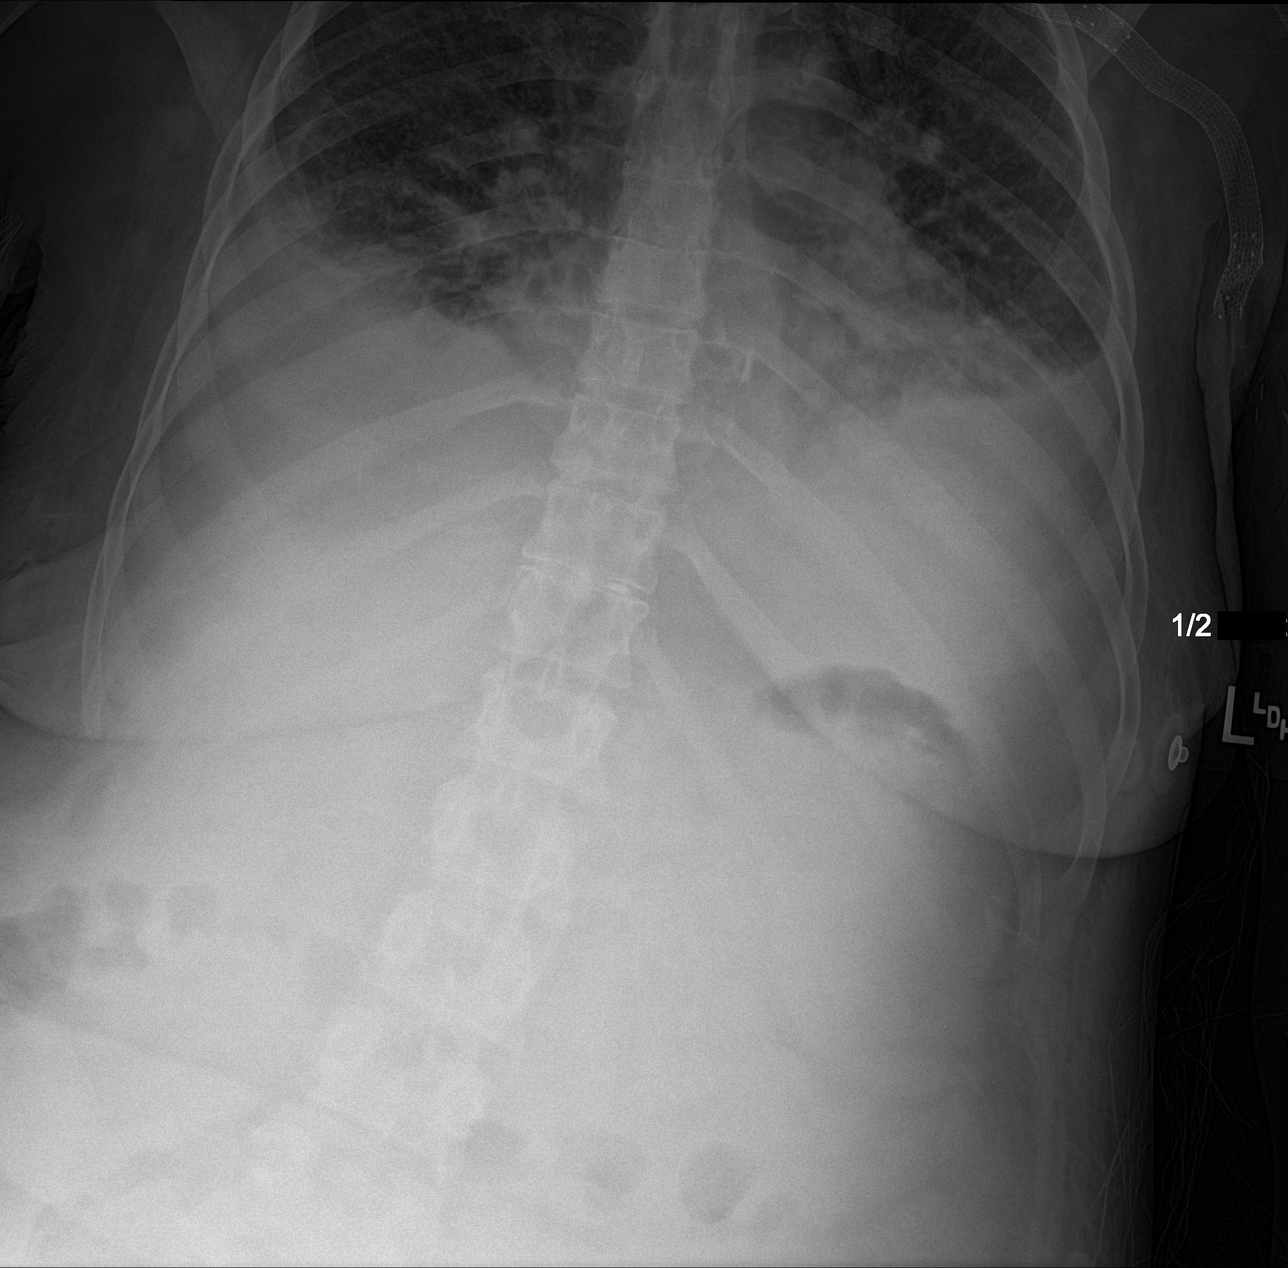

[abdomen supine]
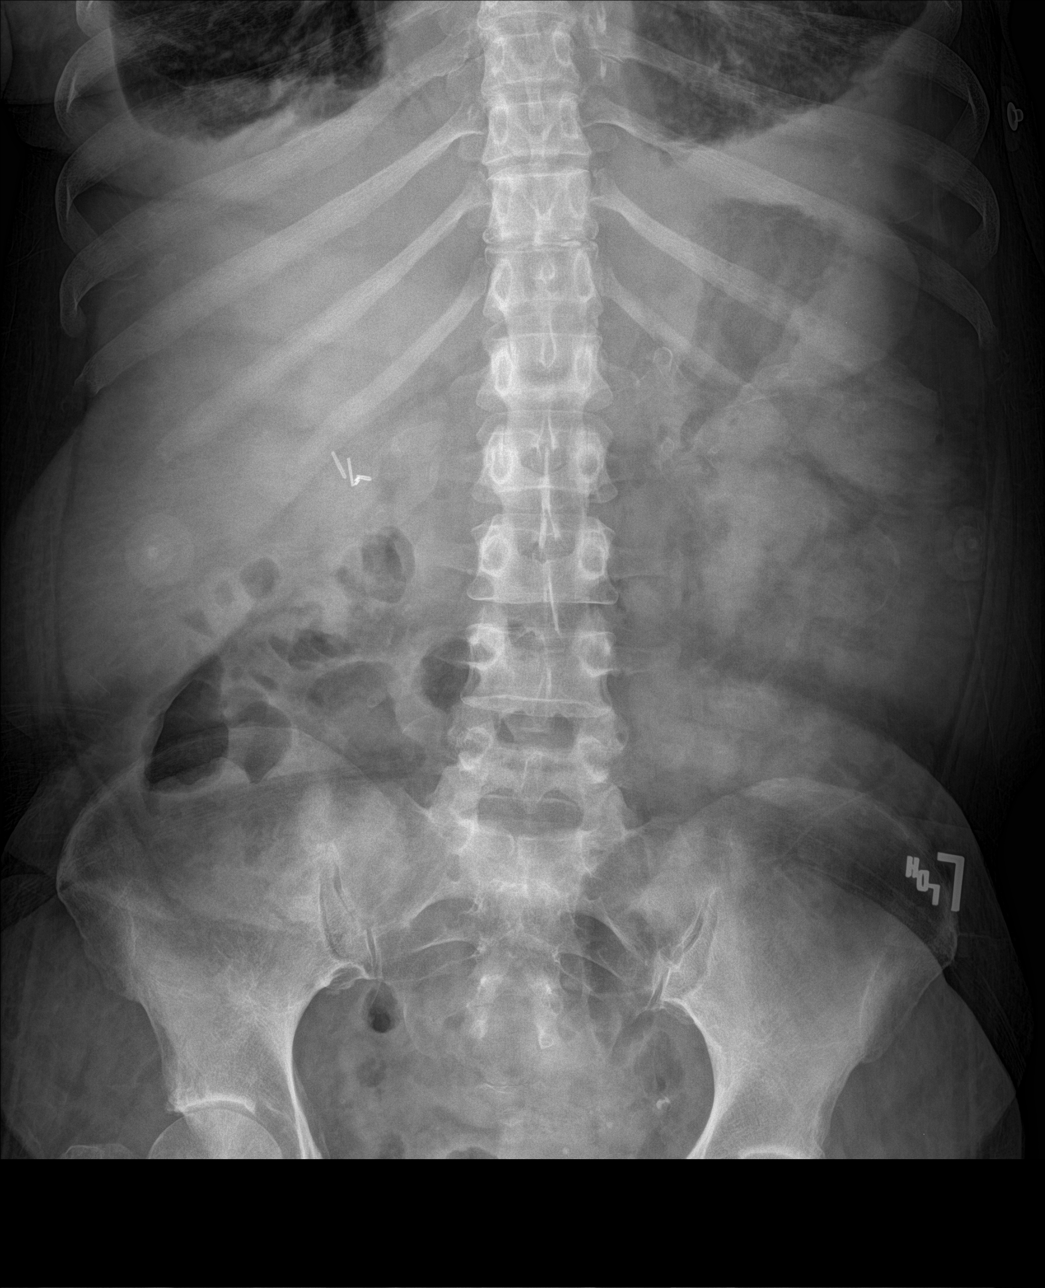

[3 of 3 positions shown; findings below may reference images not displayed]

FINDINGS: The bowel gas pattern is nonobstructive. There is no evidence of
free intraperitoneal air or significant bowel wall thickening.
Cholecystectomy clips and scattered vascular calcifications are
noted. The bones appear unremarkable. There are small to moderate
bilateral pleural effusions with associated bibasilar pulmonary
opacities, similar to earlier chest radiographs. Suspected
generalized soft tissue edema.
IMPRESSION: Anasarca with suspected generalized soft tissue edema and bilateral
pleural effusions. Normal bowel gas pattern.

## 2016-02-27 IMAGING — CR DG CHEST 2V
1 series · 2 of 2 positions shown · non-contrast
Comparison: 10/11/2015

CLINICAL DATA: Left-sided chest pain after finishing dialysis
today.

EXAM:
CHEST  2 VIEW

[Series 1: dg chest 2 view · 0.14mm/px · 2 of 2 slices shown]
[im 1/2]
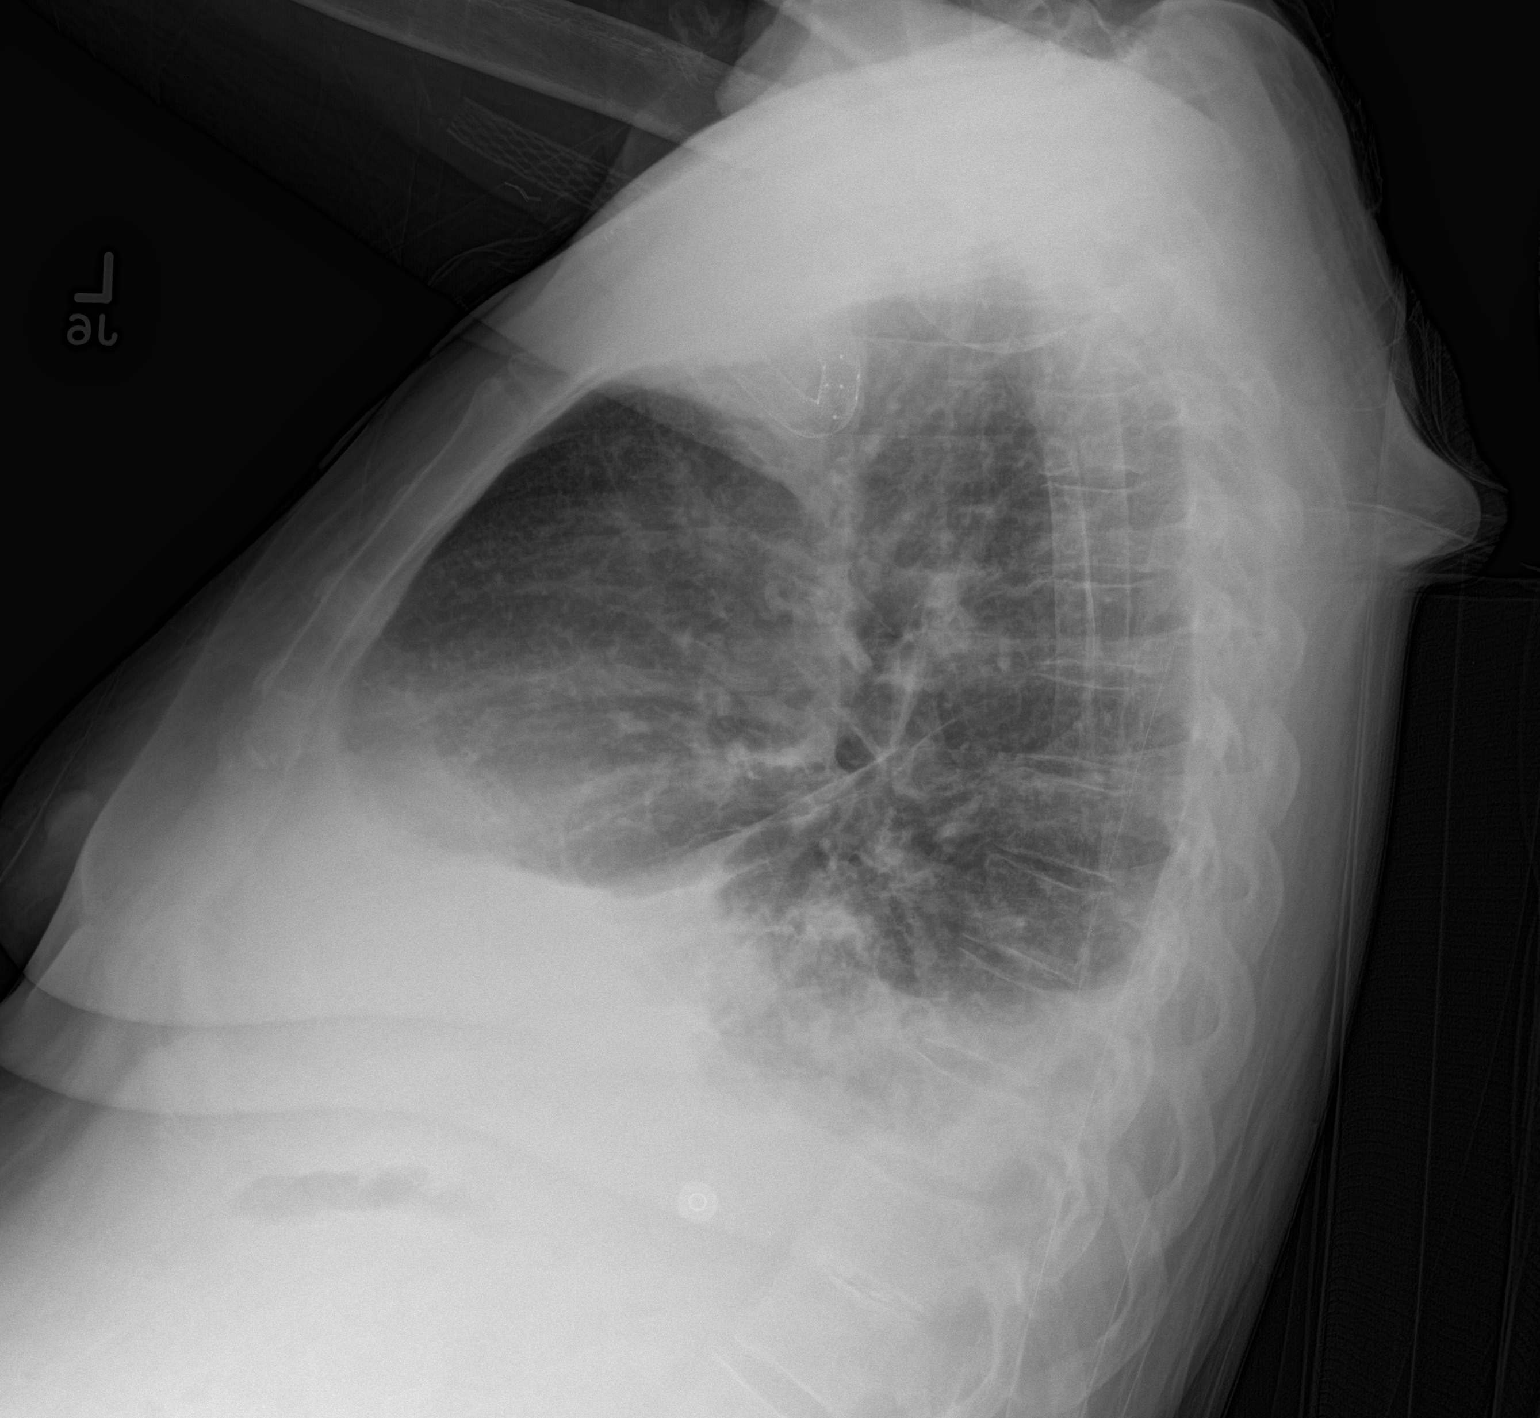
[im 2/2]
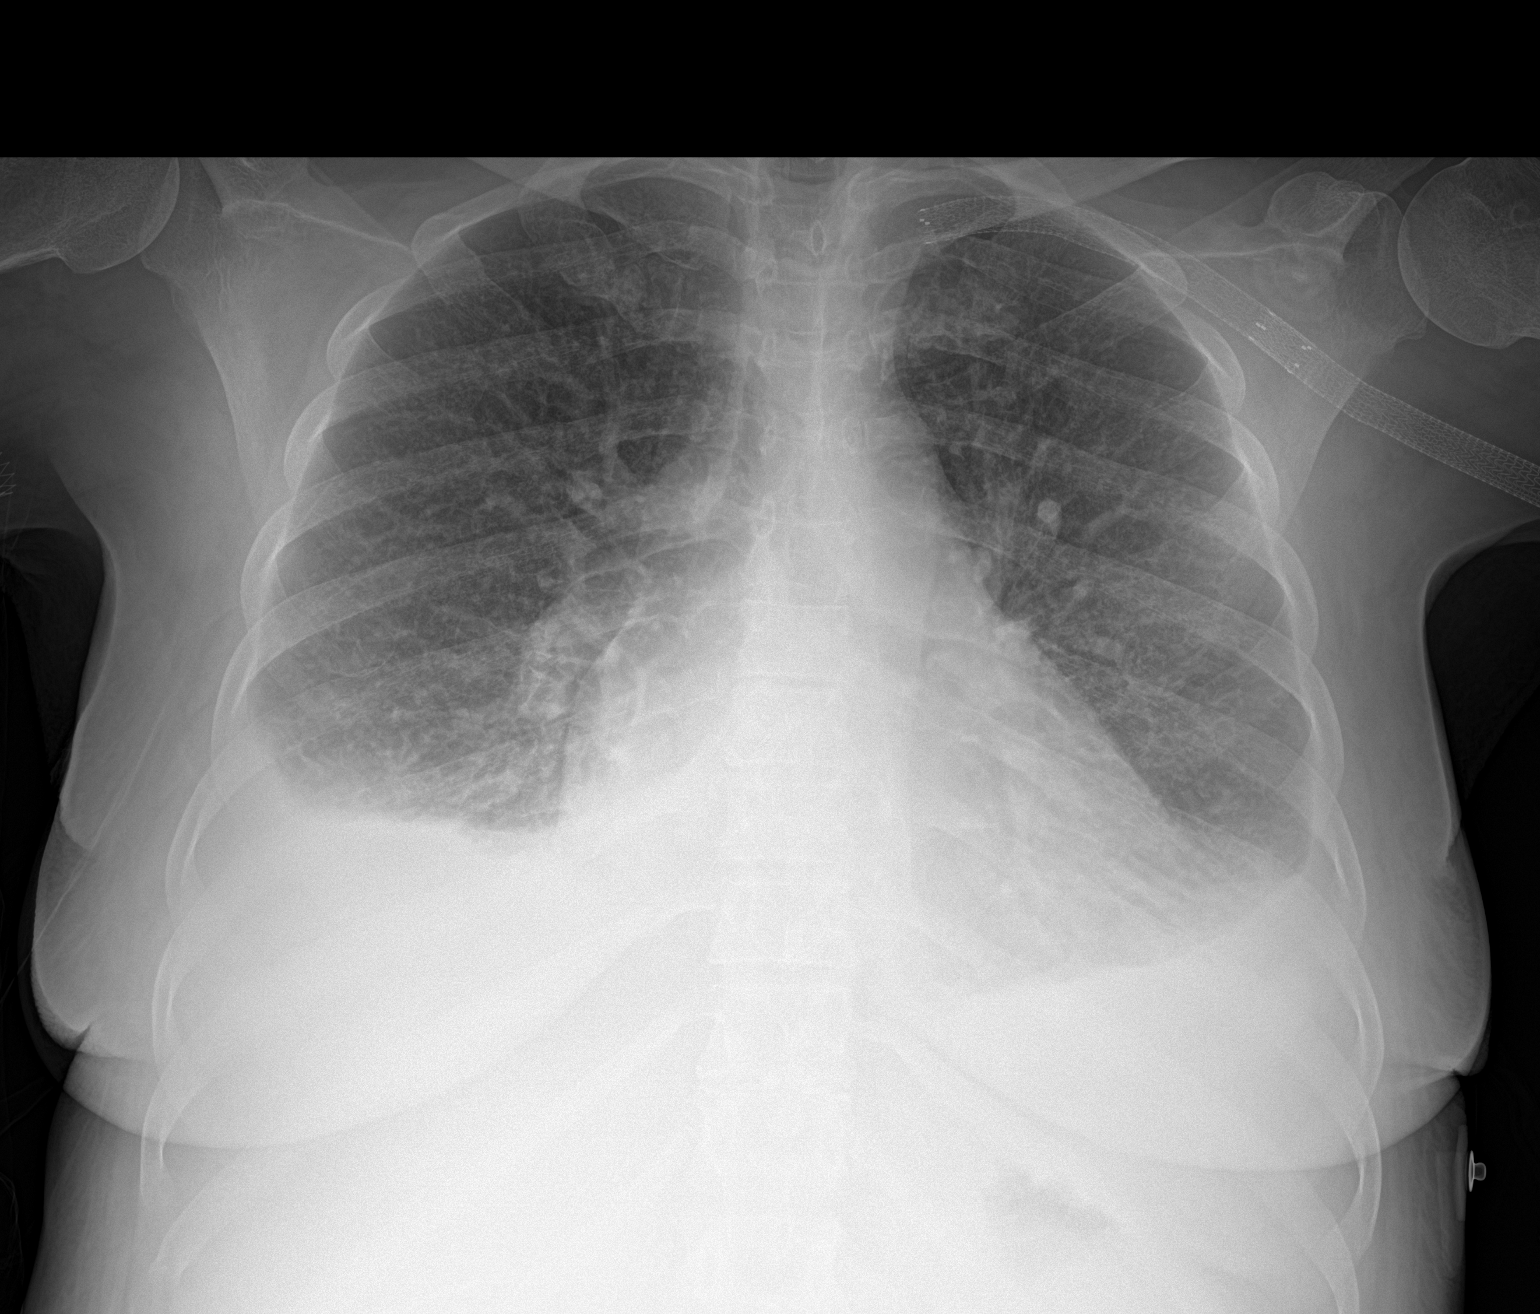

[2 of 2 positions shown; findings below may reference images not displayed]

FINDINGS: Left-sided subclavian and axillary vascular stent. Midline trachea.
Mild cardiomegaly. Small left pleural effusion is similar. A small
right pleural effusion is new. No pneumothorax. Mild interstitial
edema. Improved left base aeration with increased right base
airspace disease.
IMPRESSION: Mild congestive heart failure.

Similar left pleural effusion with decreased left base atelectasis.

New right pleural effusion with adjacent airspace disease, favored
to represent atelectasis.

## 2016-03-01 IMAGING — CR DG CHEST 2V
1 series · 2 of 2 positions shown · non-contrast
Comparison: 10/22/2015

CLINICAL DATA: Vomiting, shortness of breath x1 day, right upper
quadrant pain

EXAM:
CHEST  2 VIEW

[Series 1: dg chest 2 view · 0.14mm/px · 2 of 2 slices shown]
[im 1/2]
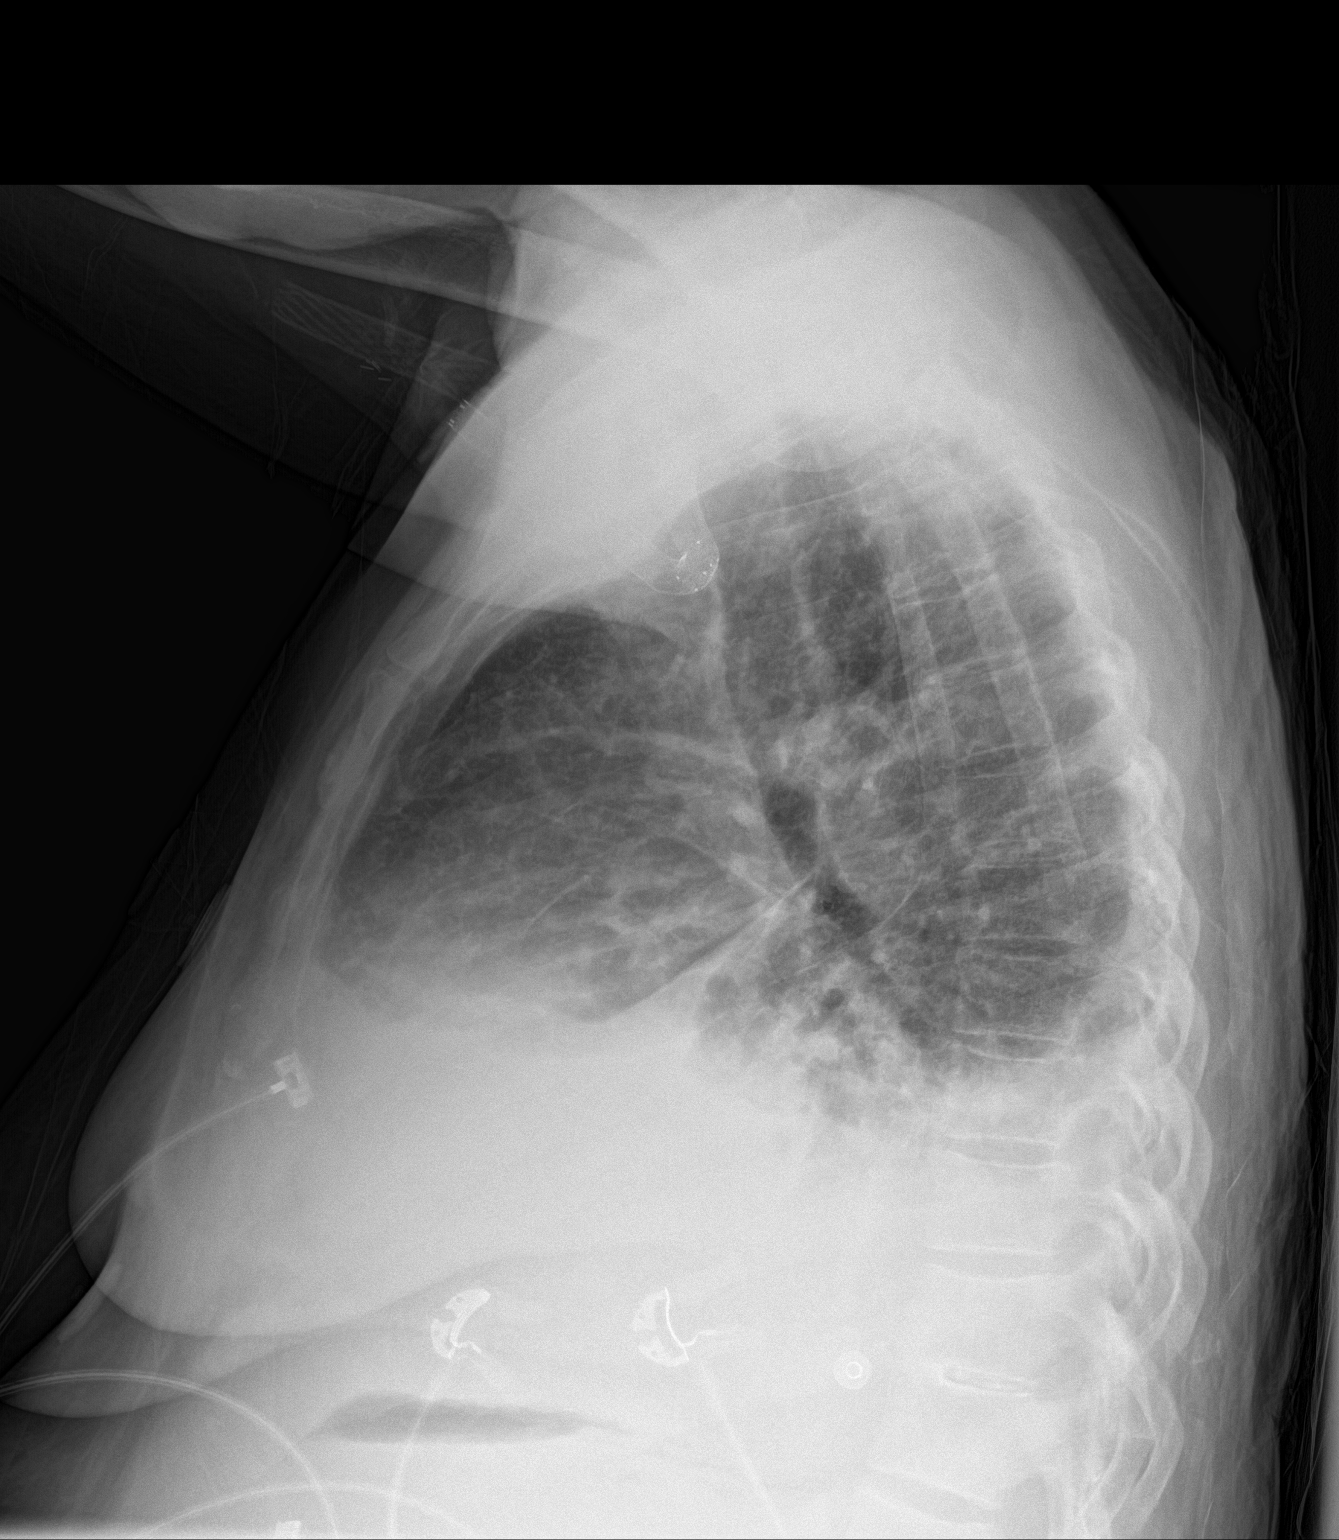
[im 2/2]
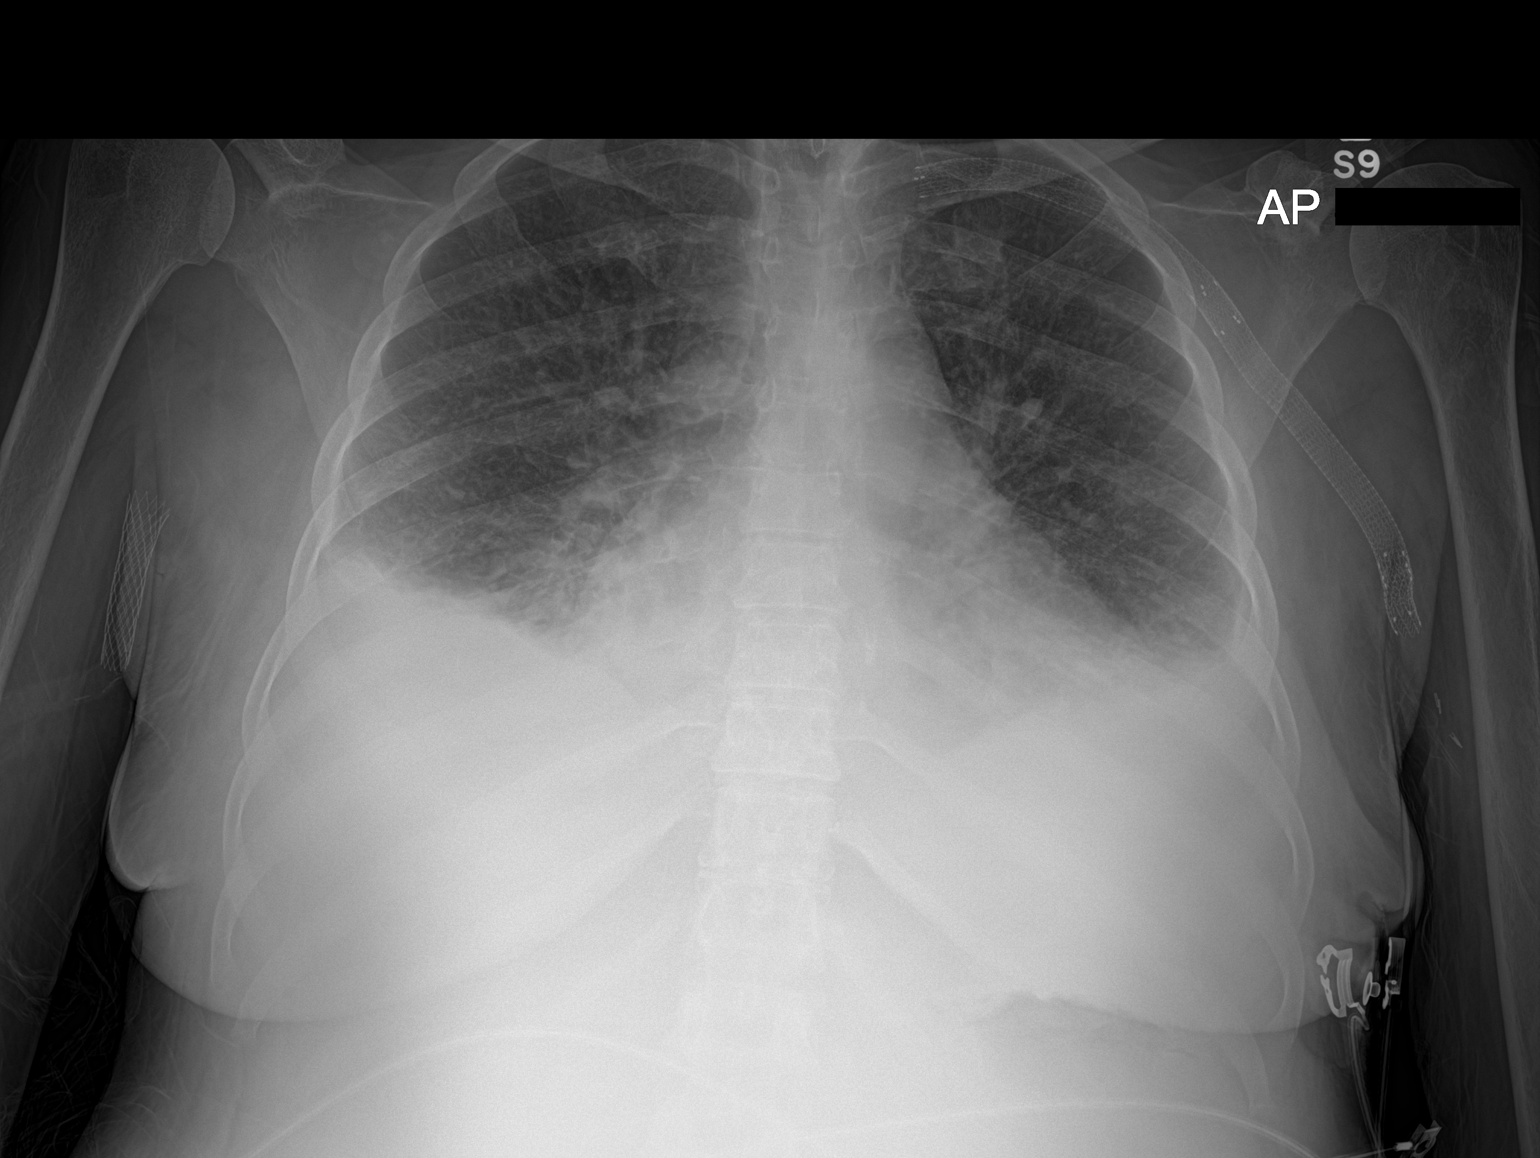

[2 of 2 positions shown; findings below may reference images not displayed]

FINDINGS: Increased interstitial markings. Small bilateral pleural effusions.
Associated mild bilateral lower lobe opacities, likely atelectasis.
No pneumothorax.

Cardiomegaly.

Left axillary and subclavian stent.
IMPRESSION: Cardiomegaly with mild interstitial edema and small bilateral
pleural effusions, unchanged.

Associated mild bilateral lower lobe opacities, likely atelectasis.

## 2016-03-14 ENCOUNTER — Emergency Department
Admission: EM | Admit: 2016-03-14 | Discharge: 2016-03-14 | Disposition: A | Discharge status: Home / Self Care | Payer: Medicare Other | Source: Home / Self Care | Attending: Emergency Medicine | Admitting: Emergency Medicine

## 2016-03-14 ENCOUNTER — Encounter: Payer: Self-pay | Admitting: Emergency Medicine

## 2016-03-14 DIAGNOSIS — M1990 Unspecified osteoarthritis, unspecified site: Secondary | ICD-10-CM | POA: Insufficient documentation

## 2016-03-14 DIAGNOSIS — E1122 Type 2 diabetes mellitus with diabetic chronic kidney disease: Secondary | ICD-10-CM

## 2016-03-14 DIAGNOSIS — Z992 Dependence on renal dialysis: Secondary | ICD-10-CM

## 2016-03-14 DIAGNOSIS — Z87891 Personal history of nicotine dependence: Secondary | ICD-10-CM

## 2016-03-14 DIAGNOSIS — I252 Old myocardial infarction: Secondary | ICD-10-CM

## 2016-03-14 DIAGNOSIS — F329 Major depressive disorder, single episode, unspecified: Secondary | ICD-10-CM

## 2016-03-14 DIAGNOSIS — Z7982 Long term (current) use of aspirin: Secondary | ICD-10-CM

## 2016-03-14 DIAGNOSIS — R1084 Generalized abdominal pain: Secondary | ICD-10-CM | POA: Diagnosis not present

## 2016-03-14 DIAGNOSIS — J45909 Unspecified asthma, uncomplicated: Secondary | ICD-10-CM | POA: Insufficient documentation

## 2016-03-14 DIAGNOSIS — J449 Chronic obstructive pulmonary disease, unspecified: Secondary | ICD-10-CM | POA: Insufficient documentation

## 2016-03-14 DIAGNOSIS — Z79899 Other long term (current) drug therapy: Secondary | ICD-10-CM | POA: Insufficient documentation

## 2016-03-14 DIAGNOSIS — I2510 Atherosclerotic heart disease of native coronary artery without angina pectoris: Secondary | ICD-10-CM | POA: Insufficient documentation

## 2016-03-14 DIAGNOSIS — Z7902 Long term (current) use of antithrombotics/antiplatelets: Secondary | ICD-10-CM

## 2016-03-14 DIAGNOSIS — I132 Hypertensive heart and chronic kidney disease with heart failure and with stage 5 chronic kidney disease, or end stage renal disease: Secondary | ICD-10-CM

## 2016-03-14 DIAGNOSIS — R1013 Epigastric pain: Secondary | ICD-10-CM

## 2016-03-14 DIAGNOSIS — N186 End stage renal disease: Secondary | ICD-10-CM | POA: Insufficient documentation

## 2016-03-14 DIAGNOSIS — I5032 Chronic diastolic (congestive) heart failure: Secondary | ICD-10-CM | POA: Insufficient documentation

## 2016-03-14 DIAGNOSIS — K859 Acute pancreatitis without necrosis or infection, unspecified: Secondary | ICD-10-CM | POA: Diagnosis not present

## 2016-03-14 LAB — CBC WITH DIFFERENTIAL/PLATELET
Basophils Absolute: 0.1 10*3/uL (ref 0–0.1)
Basophils Relative: 1 %
Eosinophils Absolute: 1.1 10*3/uL — ABNORMAL HIGH (ref 0–0.7)
Eosinophils Relative: 17 %
HCT: 31 % — ABNORMAL LOW (ref 35.0–47.0)
Hemoglobin: 9.9 g/dL — ABNORMAL LOW (ref 12.0–16.0)
Lymphocytes Relative: 30 %
Lymphs Abs: 1.8 10*3/uL (ref 1.0–3.6)
MCH: 24.8 pg — ABNORMAL LOW (ref 26.0–34.0)
MCHC: 32 g/dL (ref 32.0–36.0)
MCV: 77.4 fL — ABNORMAL LOW (ref 80.0–100.0)
Monocytes Absolute: 0.5 10*3/uL (ref 0.2–0.9)
Monocytes Relative: 8 %
Neutro Abs: 2.7 10*3/uL (ref 1.4–6.5)
Neutrophils Relative %: 44 %
Platelets: 202 10*3/uL (ref 150–440)
RBC: 4 MIL/uL (ref 3.80–5.20)
RDW: 21.3 % — ABNORMAL HIGH (ref 11.5–14.5)
WBC: 6.1 10*3/uL (ref 3.6–11.0)

## 2016-03-14 LAB — COMPREHENSIVE METABOLIC PANEL
ALT: 14 U/L (ref 14–54)
AST: 22 U/L (ref 15–41)
Albumin: 3.1 g/dL — ABNORMAL LOW (ref 3.5–5.0)
Alkaline Phosphatase: 104 U/L (ref 38–126)
Anion gap: 9 (ref 5–15)
BUN: 20 mg/dL (ref 6–20)
CO2: 30 mmol/L (ref 22–32)
Calcium: 8.5 mg/dL — ABNORMAL LOW (ref 8.9–10.3)
Chloride: 94 mmol/L — ABNORMAL LOW (ref 101–111)
Creatinine, Ser: 4.46 mg/dL — ABNORMAL HIGH (ref 0.44–1.00)
GFR calc Af Amer: 12 mL/min — ABNORMAL LOW (ref >60)
GFR calc non Af Amer: 10 mL/min — ABNORMAL LOW (ref >60)
Glucose, Bld: 80 mg/dL (ref 65–99)
Potassium: 3.8 mmol/L (ref 3.5–5.1)
Sodium: 133 mmol/L — ABNORMAL LOW (ref 135–145)
Total Bilirubin: 0.8 mg/dL (ref 0.3–1.2)
Total Protein: 7.9 g/dL (ref 6.5–8.1)

## 2016-03-14 LAB — LIPASE, BLOOD: Lipase: <10 U/L — ABNORMAL LOW (ref 11–51)

## 2016-03-14 LAB — TROPONIN I: Troponin I: <0.03 ng/mL (ref <0.031)

## 2016-03-14 MED ORDER — PROMETHAZINE HCL 25 MG/ML IJ SOLN
25.0000 mg | Freq: Once | INTRAMUSCULAR | Status: AC
  Administered 2016-03-14: 25 mg via INTRAMUSCULAR
  Filled 2016-03-14: qty 1

## 2016-03-14 MED ORDER — HYDROMORPHONE HCL 1 MG/ML IJ SOLN
1.0000 mg | Freq: Once | INTRAMUSCULAR | Status: AC
  Administered 2016-03-14: 1 mg via INTRAVENOUS
  Filled 2016-03-14: qty 1

## 2016-03-14 NOTE — ED Notes (Signed)
Patient comfortable, pain managed, awaiting son to pick up

## 2016-03-14 NOTE — ED Provider Notes (Signed)
[facility] Emergency Department Provider Note   ____________________________________________  Time seen: I have reviewed the triage vital signs and the triage nursing note.  HISTORY  Chief Complaint Abdominal Pain   Historian Patient  HPI [name] is a 55 y.o. female with history of end-stage renal disease who dialyzes on Monday Wednesday Friday, also history of MI, COPD, CHF, diabetes, hypertension, and pancreatitis presents here for nausea and abdominal pain. She states when she woke up she didn't feel that well and she took a nausea tablet and went on to dialysis. When she got to dialysis she was complaining of worsening abdominal pain and she was sent to the emergency department. Patient states that she had been home she probably would've taken a hydrocodone tablet, she states that she does not have to take them that often.  She states that the abdominal pain starts in the epigastrium and goes all over. It's moderate to severe. The waxing and waning. Nothing seems to make it worse or better. It is similar to prior episodes, but it's been a while since she's had an episode like this. She states that Phenergan and Dilaudid make the pain go away.    Past Medical History  Diagnosis Date  . Asthma   . Collagen vascular disease ([place])   . Diabetes mellitus without complication ([place])   . Hypertension   . Coronary artery disease     a. cath 2013: stenting to RCA (report not available); b. cath 2014: LM nl, pLAD 40%, mLAD nl, ost LCx 40%, mid LCx nl, pRCA 30% @ site of prior stent, mRCA 50%  . Diabetic neuropathy ([place])   . ESRD (end stage renal disease) on dialysis ([place])     M-W-F  . GERD (gastroesophageal reflux disease)   . Hx of pancreatitis 2015  . Myocardial infarction ([place])   . Pneumonia   . Mitral regurgitation     a. echo 10/2013: EF 62%, noWMA, mildly dilated LA, mild to mod MR/TR, GR1DD  . COPD (chronic obstructive pulmonary disease) ([place])   .  chronic diastolic CHF [id number]  . dialysis 2006  . Depression   . Anxiety   . Arthritis   . Headache   . Anemia   . Renal insufficiency     Patient Active Problem List   Diagnosis Date Noted  . Malnutrition of moderate degree 12/01/2015  . Renal mass   . Dyspnea   . Acute renal failure ([place])   . Respiratory failure ([place])   . High temperature 11/14/2015  . Pulmonary edema   . Encounter for central line placement   . Encounter for orogastric (OG) tube placement   . Nausea 11/12/2015  . Hyperkalemia 10/03/2015  . C. difficile diarrhea 07/22/2015  . Pneumonia 05/21/2015  . Hypoglycemia 04/24/2015  . Unresponsiveness 04/24/2015  . Bradycardia 04/24/2015  . Hypothermia 04/24/2015  . Acute respiratory failure ([place]) 04/24/2015  . Acute diastolic CHF (congestive heart failure) ([place]) 04/05/2015  . Diabetic gastroparesis ([place]) 04/05/2015  . Hypokalemia 04/05/2015  . Generalized weakness 04/05/2015  . Acute pulmonary edema ([place]) 04/03/2015  . Nausea and vomiting 04/03/2015  . Hypoglycemia associated with diabetes ([place]) 04/03/2015  . Anemia of chronic disease 04/03/2015  . Secondary hyperparathyroidism ([place]) 04/03/2015  . Pressure ulcer 04/02/2015  . Acute respiratory failure with hypoxia ([place]) 04/01/2015  . Adjustment disorder with anxiety 03/14/2015  . Somatic symptom disorder, mild 03/08/2015  . Coronary artery disease involving native coronary artery of native heart without angina pectoris   .  Nausea & vomiting 03/06/2015  . Abdominal pain 03/06/2015  . DM (diabetes mellitus) ([place]) 03/06/2015  . HTN (hypertension) 03/06/2015  . Gastroparesis 02/24/2015  . Pleural effusion 02/19/2015  . HCAP (healthcare-associated pneumonia) 02/19/2015  . End-stage renal disease on hemodialysis ([place]) 02/19/2015    Past Surgical History  Procedure Laterality Date  . Cholecystectomy    . Appendectomy    . Abdominal hysterectomy    . Dialysis fistula creation Left     upper arm  .  Esophagogastroduodenoscopy N/A 03/08/2015    Procedure: ESOPHAGOGASTRODUODENOSCOPY (EGD);  Surgeon: Manya Silvas, MD;  Location: [facility] ENDOSCOPY;  Service: Endoscopy;  Laterality: N/A;  . Cardiac catheterization Left 07/26/2015    Procedure: Left Heart Cath and Coronary Angiography;  Surgeon: Dionisio David, MD;  Location: [place] CV LAB;  Service: Cardiovascular;  Laterality: Left;  Marland Kitchen Eye surgery    . Fecal transplant N/A 08/23/2015    Procedure: FECAL TRANSPLANT;  Surgeon: Manya Silvas, MD;  Location: [facility] ENDOSCOPY;  Service: Endoscopy;  Laterality: N/A;  . Peripheral vascular catheterization N/A 12/20/2015    Procedure: Thrombectomy of dialysis access versus permcath placement;  Surgeon: Algernon Huxley, MD;  Location: [place] CV LAB;  Service: Cardiovascular;  Laterality: N/A;  . Peripheral vascular catheterization N/A 12/20/2015    Procedure: A/V Shunt Intervention;  Surgeon: Algernon Huxley, MD;  Location: [place] CV LAB;  Service: Cardiovascular;  Laterality: N/A;  . Peripheral vascular catheterization N/A 12/20/2015    Procedure: A/V Shuntogram/Fistulagram;  Surgeon: Algernon Huxley, MD;  Location: [place] CV LAB;  Service: Cardiovascular;  Laterality: N/A;  . Peripheral vascular catheterization N/A 01/02/2016    Procedure: A/V Shuntogram/Fistulagram;  Surgeon: Algernon Huxley, MD;  Location: [place] CV LAB;  Service: Cardiovascular;  Laterality: N/A;  . Peripheral vascular catheterization N/A 01/02/2016    Procedure: A/V Shunt Intervention;  Surgeon: Algernon Huxley, MD;  Location: [place] CV LAB;  Service: Cardiovascular;  Laterality: N/A;    Current Outpatient Rx  Name  Route  Sig  Dispense  Refill  . acidophilus (RISAQUAD) CAPS capsule   Oral   Take 1 capsule by mouth daily.         Marland Kitchen albuterol (PROVENTIL) (2.5 MG/3ML) 0.083% nebulizer solution   Nebulization   Take 2.5 mg by nebulization every 4 (four) hours as needed for wheezing or shortness of breath.          Marland Kitchen amLODipine (NORVASC) 10 MG tablet   Oral   Take 10 mg by mouth daily.         Marland Kitchen aspirin EC 81 MG tablet   Oral   Take 81 mg by mouth every morning.          Marland Kitchen atorvastatin (LIPITOR) 20 MG tablet   Oral   Take 20 mg by mouth every evening.         . B COMPLEX-C-FOLIC ACID PO   Oral   Take 1 tablet by mouth daily.         . bisacodyl (DULCOLAX) 5 MG EC tablet   Oral   Take 1 tablet (5 mg total) by mouth daily as needed for moderate constipation.   30 tablet   0   . carvedilol (COREG) 3.125 MG tablet   Oral   Take 3.125 mg by mouth 2 (two) times daily with a meal.         . cetirizine (ZYRTEC) 10 MG tablet   Oral  Take 10 mg by mouth daily.         . cholecalciferol (VITAMIN D) 1000 units tablet   Oral   Take 1,000 Units by mouth daily.         . citalopram (CELEXA) 20 MG tablet   Oral   Take 20 mg by mouth every morning.          . clopidogrel (PLAVIX) 75 MG tablet   Oral   Take 75 mg by mouth daily. Reported on 12/17/2015         . hydrALAZINE (APRESOLINE) 25 MG tablet   Oral   Take 25 mg by mouth 3 (three) times daily.         Marland Kitchen HYDROcodone-acetaminophen (NORCO/VICODIN) 5-325 MG tablet   Oral   Take 1 tablet by mouth every 6 (six) hours as needed for moderate pain or severe pain.         Marland Kitchen insulin aspart (NOVOLOG FLEXPEN) 100 UNIT/ML FlexPen   Subcutaneous   Inject into the skin 3 (three) times daily with meals. Take as prescribed         . insulin detemir (LEVEMIR) 100 UNIT/ML injection   Subcutaneous   Inject 6 Units into the skin at bedtime.         . lipase/protease/amylase (CREON) 12000 UNITS CPEP capsule   Oral   Take 12,000 Units by mouth 3 (three) times daily with meals.          . meclizine (ANTIVERT) 25 MG tablet   Oral   Take 25 mg by mouth 2 (two) times daily.         . metoCLOPramide (REGLAN) 10 MG tablet   Oral   Take 10 mg by mouth 3 (three) times daily before meals. Take 30 minutes before each  meal as needed for nausea         . metoprolol (LOPRESSOR) 50 MG tablet   Oral   Take 50 mg by mouth 2 (two) times daily.         . multivitamin (RENA-VIT) TABS tablet   Oral   Take 1 tablet by mouth daily.         . nitroGLYCERIN (NITROSTAT) 0.4 MG SL tablet   Sublingual   Place 0.4 mg under the tongue every 5 (five) minutes x 3 doses as needed for chest pain. *Max 3 doses per episode*         . omega-3 acid ethyl esters (LOVAZA) 1 g capsule   Oral   Take 1 g by mouth daily.         Marland Kitchen oxymetazoline (AFRIN) 0.05 % nasal spray   Each Nare   Place 2 sprays into both nostrils 2 (two) times daily.   15 mL   0   . promethazine (PHENERGAN) 25 MG tablet   Oral   Take 25 mg by mouth every 6 (six) hours as needed for nausea or vomiting.         . ranitidine (ZANTAC) 150 MG tablet   Oral   Take 150 mg by mouth every evening.          . ranolazine (RANEXA) 1000 MG SR tablet   Oral   Take 1,000 mg by mouth 2 (two) times daily.         . sevelamer carbonate (RENVELA) 800 MG tablet   Oral   Take 800 mg by mouth 3 (three) times daily with meals.          Marland Kitchen  vitamin B-12 (CYANOCOBALAMIN) 1000 MCG tablet   Oral   Take 1,000 mcg by mouth daily.           Allergies Compazine; Ace inhibitors; Ativan; Codeine; Dilaudid; Gabapentin; Losartan; Ondansetron; Prochlorperazine; Scopolamine; Zofran; Oxycodone; and Tape  Family History  Problem Relation Age of Onset  . Kidney disease Mother   . Diabetes Mother     Social History Social History  Substance Use Topics  . Smoking status: Former Smoker -- 0.50 packs/day    Types: Cigarettes    Quit date: 02/13/2015  . Smokeless tobacco: Never Used  . Alcohol Use: No    Review of Systems  Constitutional: Negative for fever. Eyes: Negative for visual changes. ENT: Negative for sore throat. Cardiovascular: Negative for chest pain. Respiratory: Negative for shortness of breath. Gastrointestinal: Negative for  diarrhea. Genitourinary: Dialysis patient, no urine Musculoskeletal: Negative for back pain. Skin: Negative for rash. Neurological: Negative for headache. 10 point Review of Systems otherwise negative ____________________________________________   PHYSICAL EXAM:  VITAL SIGNS: ED Triage Vitals  Enc Vitals Group     BP 03/14/16 0743 116/93 mmHg     Pulse Rate 03/14/16 0743 72     Resp 03/14/16 0743 22     Temp 03/14/16 0743 97.3 F (36.3 C)     Temp Source 03/14/16 0743 Oral     SpO2 03/14/16 0743 97 %     Weight 03/14/16 0743 154 lb 8.7 oz (70.1 kg)     Height 03/14/16 0743 5\' 5"  (1.651 m)     Head Cir --      Peak Flow --      Pain Score --      Pain Loc --      Pain Edu? --      Excl. in [place]? --      Constitutional: Alert and oriented. Well appearing Overall but wincing due to intermittent pain in abdomen. HEENT   Head: Normocephalic and atraumatic.      Eyes: Conjunctivae are normal. PERRL. Normal extraocular movements.      Ears:         Nose: No congestion/rhinnorhea.   Mouth/Throat: Mucous membranes are moist.   Neck: No stridor. Cardiovascular/Chest: Normal rate, regular rhythm.  No murmurs, rubs, or gallops. Respiratory: Normal respiratory effort without tachypnea nor retractions. Breath sounds are clear and equal bilaterally. No wheezes/rales/rhonchi. Gastrointestinal: Soft. No distention, no guarding, no rebound. Moderate tenderness diffusely with a worse pain to palpation in the epigastrium area.  Genitourinary/rectal:Deferred Musculoskeletal: Nontender with normal range of motion in all extremities. No joint effusions.  No lower extremity tenderness.  No edema. Neurologic:  Normal speech and language. No gross or focal neurologic deficits are appreciated. Skin:  Skin is warm, dry and intact. No rash noted. Psychiatric: Mood and affect are normal. Speech and behavior are normal. Patient exhibits appropriate insight and  judgment.  ____________________________________________   EKG I, Lisa Roca, MD, the attending physician have personally viewed and interpreted all ECGs.  71 bpm. Normal sinus rhythm. Narrow QRS. Normal axis. Nonspecific ST and T-wave ____________________________________________  LABS (pertinent positives/negatives)  Labs Reviewed  COMPREHENSIVE METABOLIC PANEL - Abnormal; Notable for the following:    Sodium 133 (*)    Chloride 94 (*)    Creatinine, Ser 4.46 (*)    Calcium 8.5 (*)    Albumin 3.1 (*)    GFR calc non Af Amer 10 (*)    GFR calc Af Amer 12 (*)    All other components  within normal limits  CBC WITH DIFFERENTIAL/PLATELET - Abnormal; Notable for the following:    Hemoglobin 9.9 (*)    HCT 31.0 (*)    MCV 77.4 (*)    MCH 24.8 (*)    RDW 21.3 (*)    Eosinophils Absolute 1.1 (*)    All other components within normal limits  LIPASE, BLOOD - Abnormal; Notable for the following:    Lipase <10 (*)    All other components within normal limits  TROPONIN I    ____________________________________________  RADIOLOGY All Xrays were viewed by me. Imaging interpreted by Radiologist.  None __________________________________________  PROCEDURES  Procedure(s) performed: None  Critical Care performed: None  ____________________________________________   ED COURSE / ASSESSMENT AND PLAN  Pertinent labs & imaging results that were available during my care of the patient were reviewed by me and considered in my medical decision making (see chart for details).   This patient does have a history of chronic abdominal pain and recurrent pancreatitis, and she states that this does feel similar to prior episodes, but it's been a while. She is not reporting bilious emesis, black or bloody stools, diarrhea, fever. As I'm with her she seems to get worse and worse. She is reportedly extremely hard peripheral IV stick. I did obtain a left EJ for blood and IV access. Patient  will be given what she states typically works, Phenergan and Dilaudid. I am checking blood work just to make sure there are no high risk/red flags features although my clinical is that this is a similar episode the prior chronic abdominal pain and I don't have a high suspicion for new emergency cause or need for CT scan at this point in time. I think I will try to avoid CT scan radiation risk if patient does not have any additional surprises on evaluation.  Normal white blood cell count. Chronic hyponatremia at 133 today. No elevated potassium. BUN is 20 with creatinine 4.46, patient does not need emergency dialysis here in emergency department.  Her hemoglobin is anemic at 9.9, slightly below most recent, but in the past she does have chronic anemia that appears to fluctuate from 8-11, and she is not reporting black or bloody stools, some not suspicious that she is presenting with an acute bleeding issues such as a bleeding ulcer. We discussed return precautions with respect to that.  Her lipase is less than 10. Sounds like she has a history of potentially chronic pancreatitis, and so perhaps this is the cause of her symptoms, however again she is overall well-appearing and symptomatically improved and is okay for her to discharge.   Patient feels better. I tried to arrange for her to go back to dialysis as it still only 10 AM in the morning, the patient states she does not want her today and she will call and try to get fit in for 12. Her blood work does not indicate that she needs emergency dialysis today.   CONSULTATIONS:   None   Patient / Family / Caregiver informed of clinical course, medical decision-making process, and agree with plan.   I discussed return precautions, follow-up instructions, and discharged instructions with patient and/or family.   ___________________________________________   FINAL CLINICAL IMPRESSION(S) / ED DIAGNOSES   Final diagnoses:  Epigastric pain               Note: This dictation was prepared with Dragon dictation. Any transcriptional errors that result from this process are unintentional   Lisa Roca, MD  03/14/16 1003 

## 2016-03-14 NOTE — ED Notes (Signed)
Patient in bed, warm blankets given. Patient resting.

## 2016-03-14 NOTE — ED Notes (Signed)
Promethazine coming from pharmacy. Not available in [place]

## 2016-03-14 NOTE — ED Notes (Signed)
Patient resting comfortably

## 2016-03-14 NOTE — ED Notes (Addendum)
Patient arrives via [facility] from Dialysis for ABD pain N/V and shortness of breath. Patient states ShOB is normal when her abdominal pain is bad. Patient states she has chronic pancreatitis and this episode feels like past episodes. Unable to take her home pain medicine due to N/V episode that started last night. Patient on home o2 at night only

## 2016-03-14 NOTE — Discharge Instructions (Signed)
You were evaluated for abdominal pain, and although no certain cause was found, and your exam and evaluation are reassuring in the emergency department today.  Return to the emergency department for any worsening condition. Fever, black or bloody stools, vomiting blood, or any other symptoms concerning to you.   Abdominal Pain, Adult Many things can cause abdominal pain. Usually, abdominal pain is not caused by a disease and will improve without treatment. It can often be observed and treated at home. Your health care provider will do a physical exam and possibly order blood tests and X-rays to help determine the seriousness of your pain. However, in many cases, more time must pass before a clear cause of the pain can be found. Before that point, your health care provider may not know if you need more testing or further treatment. HOME CARE INSTRUCTIONS Monitor your abdominal pain for any changes. The following actions may help to alleviate any discomfort you are experiencing:  Only take over-the-counter or prescription medicines as directed by your health care provider.  Do not take laxatives unless directed to do so by your health care provider.  Try a clear liquid diet (broth, tea, or water) as directed by your health care provider. Slowly move to a bland diet as tolerated. SEEK MEDICAL CARE IF:  You have unexplained abdominal pain.  You have abdominal pain associated with nausea or diarrhea.  You have pain when you urinate or have a bowel movement.  You experience abdominal pain that wakes you in the night.  You have abdominal pain that is worsened or improved by eating food.  You have abdominal pain that is worsened with eating fatty foods.  You have a fever. SEEK IMMEDIATE MEDICAL CARE IF:  Your pain does not go away within 2 hours.  You keep throwing up (vomiting).  Your pain is felt only in portions of the abdomen, such as the right side or the left lower portion of the  abdomen.  You pass bloody or black tarry stools. MAKE SURE YOU:  Understand these instructions.  Will watch your condition.  Will get help right away if you are not doing well or get worse.   This information is not intended to replace advice given to you by your health care provider. Make sure you discuss any questions you have with your health care provider.   Document Released: 06/18/2005 Document Revised: 05/30/2015 Document Reviewed: 05/18/2013 Elsevier Interactive Patient Education [employer].

## 2016-03-15 ENCOUNTER — Inpatient Hospital Stay: Payer: Medicare Other

## 2016-03-15 ENCOUNTER — Inpatient Hospital Stay
Admission: EM | Admit: 2016-03-15 | Discharge: 2016-03-19 | DRG: 438 | Disposition: A | Discharge status: Home Health | Payer: Medicare Other | Attending: Internal Medicine | Admitting: Internal Medicine

## 2016-03-15 ENCOUNTER — Other Ambulatory Visit: Payer: Self-pay

## 2016-03-15 ENCOUNTER — Encounter: Payer: Self-pay | Admitting: Emergency Medicine

## 2016-03-15 DIAGNOSIS — N186 End stage renal disease: Secondary | ICD-10-CM | POA: Diagnosis present

## 2016-03-15 DIAGNOSIS — R1084 Generalized abdominal pain: Secondary | ICD-10-CM | POA: Diagnosis present

## 2016-03-15 DIAGNOSIS — Z992 Dependence on renal dialysis: Secondary | ICD-10-CM

## 2016-03-15 DIAGNOSIS — G43A1 Cyclical vomiting, intractable: Secondary | ICD-10-CM | POA: Diagnosis present

## 2016-03-15 DIAGNOSIS — N2581 Secondary hyperparathyroidism of renal origin: Secondary | ICD-10-CM | POA: Diagnosis present

## 2016-03-15 DIAGNOSIS — Z87891 Personal history of nicotine dependence: Secondary | ICD-10-CM

## 2016-03-15 DIAGNOSIS — E11319 Type 2 diabetes mellitus with unspecified diabetic retinopathy without macular edema: Secondary | ICD-10-CM | POA: Diagnosis present

## 2016-03-15 DIAGNOSIS — Z7902 Long term (current) use of antithrombotics/antiplatelets: Secondary | ICD-10-CM | POA: Diagnosis not present

## 2016-03-15 DIAGNOSIS — R1115 Cyclical vomiting syndrome unrelated to migraine: Secondary | ICD-10-CM | POA: Insufficient documentation

## 2016-03-15 DIAGNOSIS — R440 Auditory hallucinations: Secondary | ICD-10-CM | POA: Diagnosis present

## 2016-03-15 DIAGNOSIS — E785 Hyperlipidemia, unspecified: Secondary | ICD-10-CM | POA: Diagnosis present

## 2016-03-15 DIAGNOSIS — D631 Anemia in chronic kidney disease: Secondary | ICD-10-CM | POA: Diagnosis present

## 2016-03-15 DIAGNOSIS — K2100 Gastro-esophageal reflux disease with esophagitis, without bleeding: Secondary | ICD-10-CM | POA: Insufficient documentation

## 2016-03-15 DIAGNOSIS — F4323 Adjustment disorder with mixed anxiety and depressed mood: Secondary | ICD-10-CM | POA: Diagnosis present

## 2016-03-15 DIAGNOSIS — I252 Old myocardial infarction: Secondary | ICD-10-CM

## 2016-03-15 DIAGNOSIS — Z888 Allergy status to other drugs, medicaments and biological substances status: Secondary | ICD-10-CM | POA: Diagnosis not present

## 2016-03-15 DIAGNOSIS — K861 Other chronic pancreatitis: Secondary | ICD-10-CM | POA: Diagnosis present

## 2016-03-15 DIAGNOSIS — J449 Chronic obstructive pulmonary disease, unspecified: Secondary | ICD-10-CM | POA: Diagnosis present

## 2016-03-15 DIAGNOSIS — E11649 Type 2 diabetes mellitus with hypoglycemia without coma: Secondary | ICD-10-CM | POA: Diagnosis present

## 2016-03-15 DIAGNOSIS — I132 Hypertensive heart and chronic kidney disease with heart failure and with stage 5 chronic kidney disease, or end stage renal disease: Secondary | ICD-10-CM | POA: Diagnosis present

## 2016-03-15 DIAGNOSIS — Z7982 Long term (current) use of aspirin: Secondary | ICD-10-CM | POA: Diagnosis not present

## 2016-03-15 DIAGNOSIS — R1110 Vomiting, unspecified: Secondary | ICD-10-CM | POA: Insufficient documentation

## 2016-03-15 DIAGNOSIS — G8929 Other chronic pain: Secondary | ICD-10-CM | POA: Diagnosis present

## 2016-03-15 DIAGNOSIS — K859 Acute pancreatitis without necrosis or infection, unspecified: Secondary | ICD-10-CM | POA: Diagnosis present

## 2016-03-15 DIAGNOSIS — R197 Diarrhea, unspecified: Secondary | ICD-10-CM | POA: Diagnosis present

## 2016-03-15 DIAGNOSIS — E1122 Type 2 diabetes mellitus with diabetic chronic kidney disease: Secondary | ICD-10-CM | POA: Diagnosis present

## 2016-03-15 DIAGNOSIS — K921 Melena: Secondary | ICD-10-CM | POA: Diagnosis present

## 2016-03-15 DIAGNOSIS — Z841 Family history of disorders of kidney and ureter: Secondary | ICD-10-CM

## 2016-03-15 DIAGNOSIS — Z79899 Other long term (current) drug therapy: Secondary | ICD-10-CM

## 2016-03-15 DIAGNOSIS — K2970 Gastritis, unspecified, without bleeding: Secondary | ICD-10-CM | POA: Diagnosis present

## 2016-03-15 DIAGNOSIS — Z833 Family history of diabetes mellitus: Secondary | ICD-10-CM

## 2016-03-15 DIAGNOSIS — R441 Visual hallucinations: Secondary | ICD-10-CM | POA: Diagnosis present

## 2016-03-15 DIAGNOSIS — I2510 Atherosclerotic heart disease of native coronary artery without angina pectoris: Secondary | ICD-10-CM | POA: Diagnosis present

## 2016-03-15 DIAGNOSIS — R109 Unspecified abdominal pain: Secondary | ICD-10-CM | POA: Diagnosis present

## 2016-03-15 DIAGNOSIS — I5032 Chronic diastolic (congestive) heart failure: Secondary | ICD-10-CM | POA: Diagnosis present

## 2016-03-15 DIAGNOSIS — F331 Major depressive disorder, recurrent, moderate: Secondary | ICD-10-CM | POA: Diagnosis present

## 2016-03-15 LAB — URINALYSIS COMPLETE WITH MICROSCOPIC (ARMC ONLY)
Bilirubin Urine: NEGATIVE
Glucose, UA: 50 mg/dL — AB
Hgb urine dipstick: NEGATIVE
Ketones, ur: NEGATIVE mg/dL
Leukocytes, UA: NEGATIVE
Nitrite: NEGATIVE
Protein, ur: 100 mg/dL — AB
Specific Gravity, Urine: 1.01 (ref 1.005–1.030)
Squamous Epithelial / LPF: NONE SEEN
pH: 9 — ABNORMAL HIGH (ref 5.0–8.0)

## 2016-03-15 LAB — LIPASE, BLOOD: Lipase: 18 U/L (ref 11–51)

## 2016-03-15 LAB — COMPREHENSIVE METABOLIC PANEL
ALT: 15 U/L (ref 14–54)
AST: 27 U/L (ref 15–41)
Albumin: 3.1 g/dL — ABNORMAL LOW (ref 3.5–5.0)
Alkaline Phosphatase: 106 U/L (ref 38–126)
Anion gap: 10 (ref 5–15)
BUN: 26 mg/dL — ABNORMAL HIGH (ref 6–20)
CO2: 29 mmol/L (ref 22–32)
Calcium: 8.4 mg/dL — ABNORMAL LOW (ref 8.9–10.3)
Chloride: 97 mmol/L — ABNORMAL LOW (ref 101–111)
Creatinine, Ser: 5.63 mg/dL — ABNORMAL HIGH (ref 0.44–1.00)
GFR calc Af Amer: 9 mL/min — ABNORMAL LOW (ref >60)
GFR calc non Af Amer: 8 mL/min — ABNORMAL LOW (ref >60)
Glucose, Bld: 124 mg/dL — ABNORMAL HIGH (ref 65–99)
Potassium: 3.9 mmol/L (ref 3.5–5.1)
Sodium: 136 mmol/L (ref 135–145)
Total Bilirubin: 0.9 mg/dL (ref 0.3–1.2)
Total Protein: 7.8 g/dL (ref 6.5–8.1)

## 2016-03-15 LAB — GLUCOSE, CAPILLARY
Glucose-Capillary: 60 mg/dL — ABNORMAL LOW (ref 65–99)
Glucose-Capillary: 63 mg/dL — ABNORMAL LOW (ref 65–99)
Glucose-Capillary: 64 mg/dL — ABNORMAL LOW (ref 65–99)
Glucose-Capillary: 77 mg/dL (ref 65–99)
Glucose-Capillary: 80 mg/dL (ref 65–99)

## 2016-03-15 LAB — HEMOGLOBIN A1C: Hgb A1c MFr Bld: 4.6 % (ref 4.0–6.0)

## 2016-03-15 LAB — CBC
HCT: 31 % — ABNORMAL LOW (ref 35.0–47.0)
Hemoglobin: 9.9 g/dL — ABNORMAL LOW (ref 12.0–16.0)
MCH: 25.2 pg — ABNORMAL LOW (ref 26.0–34.0)
MCHC: 32 g/dL (ref 32.0–36.0)
MCV: 78.9 fL — ABNORMAL LOW (ref 80.0–100.0)
Platelets: 192 10*3/uL (ref 150–440)
RBC: 3.94 MIL/uL (ref 3.80–5.20)
RDW: 21.3 % — ABNORMAL HIGH (ref 11.5–14.5)
WBC: 5.2 10*3/uL (ref 3.6–11.0)

## 2016-03-15 LAB — TSH: TSH: 1.28 u[IU]/mL (ref 0.350–4.500)

## 2016-03-15 LAB — VITAMIN B12: Vitamin B-12: 1362 pg/mL — ABNORMAL HIGH (ref 180–914)

## 2016-03-15 MED ORDER — LORATADINE 10 MG PO TABS
10.0000 mg | ORAL_TABLET | Freq: Every day | ORAL | Status: DC
  Administered 2016-03-17 – 2016-03-19 (×2): 10 mg via ORAL
  Filled 2016-03-15 (×3): qty 1

## 2016-03-15 MED ORDER — CLOPIDOGREL BISULFATE 75 MG PO TABS
75.0000 mg | ORAL_TABLET | Freq: Every day | ORAL | Status: DC
  Administered 2016-03-15 – 2016-03-19 (×4): 75 mg via ORAL
  Filled 2016-03-15 (×4): qty 1

## 2016-03-15 MED ORDER — CITALOPRAM HYDROBROMIDE 20 MG PO TABS
20.0000 mg | ORAL_TABLET | ORAL | Status: DC
  Administered 2016-03-16 – 2016-03-19 (×5): 20 mg via ORAL
  Filled 2016-03-15 (×5): qty 1

## 2016-03-15 MED ORDER — BISACODYL 5 MG PO TBEC: 5.0000 mg | DELAYED_RELEASE_TABLET | Freq: Every day | ORAL | Status: DC | PRN

## 2016-03-15 MED ORDER — RISAQUAD PO CAPS
1.0000 | ORAL_CAPSULE | Freq: Every day | ORAL | Status: DC
  Administered 2016-03-16 – 2016-03-19 (×3): 1 via ORAL
  Filled 2016-03-15 (×3): qty 1

## 2016-03-15 MED ORDER — HEPARIN SODIUM (PORCINE) 5000 UNIT/ML IJ SOLN
5000.0000 [IU] | Freq: Three times a day (TID) | INTRAMUSCULAR | Status: DC
  Administered 2016-03-15 – 2016-03-19 (×9): 5000 [IU] via SUBCUTANEOUS
  Filled 2016-03-15 (×10): qty 1

## 2016-03-15 MED ORDER — OMEGA-3-ACID ETHYL ESTERS 1 G PO CAPS
1.0000 g | ORAL_CAPSULE | Freq: Every day | ORAL | Status: DC
  Administered 2016-03-17 – 2016-03-19 (×2): 1 g via ORAL
  Filled 2016-03-15 (×3): qty 1

## 2016-03-15 MED ORDER — ASPIRIN EC 81 MG PO TBEC
81.0000 mg | DELAYED_RELEASE_TABLET | ORAL | Status: DC
  Administered 2016-03-17 – 2016-03-19 (×2): 81 mg via ORAL
  Filled 2016-03-15 (×2): qty 1

## 2016-03-15 MED ORDER — ALBUTEROL SULFATE (2.5 MG/3ML) 0.083% IN NEBU: 2.5000 mg | INHALATION_SOLUTION | RESPIRATORY_TRACT | Status: DC | PRN

## 2016-03-15 MED ORDER — VITAMIN B-12 1000 MCG PO TABS
1000.0000 ug | ORAL_TABLET | Freq: Every day | ORAL | Status: DC
  Administered 2016-03-19: 1000 ug via ORAL
  Filled 2016-03-15 (×2): qty 1

## 2016-03-15 MED ORDER — RANOLAZINE ER 500 MG PO TB12
1000.0000 mg | ORAL_TABLET | Freq: Two times a day (BID) | ORAL | Status: DC
  Administered 2016-03-16 – 2016-03-19 (×7): 1000 mg via ORAL
  Filled 2016-03-15 (×6): qty 2

## 2016-03-15 MED ORDER — PANCRELIPASE (LIP-PROT-AMYL) 12000-38000 UNITS PO CPEP
12000.0000 [IU] | ORAL_CAPSULE | ORAL | Status: DC
  Administered 2016-03-16 – 2016-03-19 (×7): 12000 [IU] via ORAL
  Filled 2016-03-15 (×9): qty 1

## 2016-03-15 MED ORDER — PROMETHAZINE HCL 25 MG/ML IJ SOLN
25.0000 mg | Freq: Four times a day (QID) | INTRAMUSCULAR | Status: DC | PRN
  Administered 2016-03-15 – 2016-03-19 (×9): 25 mg via INTRAVENOUS
  Filled 2016-03-15 (×9): qty 1

## 2016-03-15 MED ORDER — PROMETHAZINE HCL 25 MG PO TABS
25.0000 mg | ORAL_TABLET | Freq: Four times a day (QID) | ORAL | Status: DC | PRN
  Administered 2016-03-15: 25 mg via ORAL
  Filled 2016-03-15 (×2): qty 1

## 2016-03-15 MED ORDER — PANCRELIPASE (LIP-PROT-AMYL) 12000-38000 UNITS PO CPEP: 12000.0000 [IU] | ORAL_CAPSULE | ORAL | Status: DC

## 2016-03-15 MED ORDER — ATORVASTATIN CALCIUM 20 MG PO TABS
20.0000 mg | ORAL_TABLET | Freq: Every evening | ORAL | Status: DC
  Administered 2016-03-16 – 2016-03-19 (×4): 20 mg via ORAL
  Filled 2016-03-15 (×5): qty 1

## 2016-03-15 MED ORDER — DIATRIZOATE MEGLUMINE & SODIUM 66-10 % PO SOLN
15.0000 mL | Freq: Once | ORAL | Status: AC
  Administered 2016-03-15: 15 mL via ORAL

## 2016-03-15 MED ORDER — ACETAMINOPHEN 325 MG PO TABS
650.0000 mg | ORAL_TABLET | Freq: Once | ORAL | Status: AC
  Administered 2016-03-15: 650 mg via ORAL
  Filled 2016-03-15: qty 2

## 2016-03-15 MED ORDER — INSULIN ASPART 100 UNIT/ML ~~LOC~~ SOLN
0.0000 [IU] | Freq: Every day | SUBCUTANEOUS | Status: DC
Start: 2016-03-15 — End: 2016-03-20

## 2016-03-15 MED ORDER — GLUCOSE 40 % PO GEL
1.0000 | Freq: Once | ORAL | Status: AC
  Administered 2016-03-15: 37.5 g via ORAL
  Filled 2016-03-15: qty 1

## 2016-03-15 MED ORDER — B COMPLEX-C-FOLIC ACID PO TABS: ORAL_TABLET | Freq: Every day | ORAL | Status: DC

## 2016-03-15 MED ORDER — ACETAMINOPHEN 325 MG PO TABS
650.0000 mg | ORAL_TABLET | Freq: Four times a day (QID) | ORAL | Status: DC | PRN
  Administered 2016-03-17 – 2016-03-18 (×2): 650 mg via ORAL
  Filled 2016-03-15 (×2): qty 2

## 2016-03-15 MED ORDER — INSULIN ASPART 100 UNIT/ML ~~LOC~~ SOLN
0.0000 [IU] | Freq: Three times a day (TID) | SUBCUTANEOUS | Status: DC
  Administered 2016-03-19: 3 [IU] via SUBCUTANEOUS
  Filled 2016-03-15: qty 3

## 2016-03-15 MED ORDER — ONDANSETRON HCL 4 MG/2ML IJ SOLN
4.0000 mg | Freq: Once | INTRAMUSCULAR | Status: DC
Start: 2016-03-15 — End: 2016-03-15

## 2016-03-15 MED ORDER — NITROGLYCERIN 0.4 MG SL SUBL: 0.4000 mg | SUBLINGUAL_TABLET | SUBLINGUAL | Status: DC | PRN

## 2016-03-15 MED ORDER — PANTOPRAZOLE SODIUM 40 MG PO TBEC
40.0000 mg | DELAYED_RELEASE_TABLET | Freq: Two times a day (BID) | ORAL | Status: DC
  Administered 2016-03-15 – 2016-03-19 (×7): 40 mg via ORAL
  Filled 2016-03-15 (×7): qty 1

## 2016-03-15 MED ORDER — HYDROMORPHONE HCL 1 MG/ML IJ SOLN
0.5000 mg | INTRAMUSCULAR | Status: DC | PRN
  Administered 2016-03-15 – 2016-03-18 (×9): 0.5 mg via INTRAVENOUS
  Filled 2016-03-15 (×8): qty 1

## 2016-03-15 MED ORDER — FLEET ENEMA 7-19 GM/118ML RE ENEM: 1.0000 | ENEMA | Freq: Once | RECTAL | Status: DC | PRN

## 2016-03-15 MED ORDER — DOCUSATE SODIUM 100 MG PO CAPS
100.0000 mg | ORAL_CAPSULE | Freq: Two times a day (BID) | ORAL | Status: DC
  Administered 2016-03-17: 100 mg via ORAL
  Filled 2016-03-15 (×2): qty 1

## 2016-03-15 MED ORDER — METOCLOPRAMIDE HCL 5 MG/ML IJ SOLN
10.0000 mg | Freq: Once | INTRAMUSCULAR | Status: DC
  Filled 2016-03-15: qty 2

## 2016-03-15 MED ORDER — PANCRELIPASE (LIP-PROT-AMYL) 12000-38000 UNITS PO CPEP
24000.0000 [IU] | ORAL_CAPSULE | Freq: Every day | ORAL | Status: DC
  Administered 2016-03-16 – 2016-03-19 (×4): 24000 [IU] via ORAL
  Filled 2016-03-15 (×3): qty 2

## 2016-03-15 MED ORDER — HYDRALAZINE HCL 25 MG PO TABS
25.0000 mg | ORAL_TABLET | Freq: Three times a day (TID) | ORAL | Status: DC
  Administered 2016-03-15 – 2016-03-19 (×11): 25 mg via ORAL
  Filled 2016-03-15 (×11): qty 1

## 2016-03-15 MED ORDER — POLYETHYLENE GLYCOL 3350 17 G PO PACK: 17.0000 g | PACK | Freq: Every day | ORAL | Status: DC | PRN

## 2016-03-15 MED ORDER — RENA-VITE PO TABS
1.0000 | ORAL_TABLET | Freq: Every day | ORAL | Status: DC
  Administered 2016-03-17 – 2016-03-19 (×2): 1 via ORAL
  Filled 2016-03-15 (×3): qty 1

## 2016-03-15 MED ORDER — SEVELAMER CARBONATE 800 MG PO TABS
800.0000 mg | ORAL_TABLET | Freq: Three times a day (TID) | ORAL | Status: DC
  Administered 2016-03-16 – 2016-03-19 (×8): 800 mg via ORAL
  Filled 2016-03-15 (×7): qty 1

## 2016-03-15 MED ORDER — PANCRELIPASE (LIP-PROT-AMYL) 12000-38000 UNITS PO CPEP
36000.0000 [IU] | ORAL_CAPSULE | Freq: Two times a day (BID) | ORAL | Status: DC
  Administered 2016-03-16 – 2016-03-19 (×5): 36000 [IU] via ORAL
  Filled 2016-03-15 (×6): qty 3

## 2016-03-15 MED ORDER — PROMETHAZINE HCL 25 MG/ML IJ SOLN
25.0000 mg | Freq: Once | INTRAMUSCULAR | Status: AC
  Administered 2016-03-15: 25 mg via INTRAVENOUS
  Filled 2016-03-15: qty 1

## 2016-03-15 MED ORDER — VITAMIN D 1000 UNITS PO TABS
1000.0000 [IU] | ORAL_TABLET | Freq: Every day | ORAL | Status: DC
  Administered 2016-03-17 – 2016-03-19 (×2): 1000 [IU] via ORAL
  Filled 2016-03-15 (×4): qty 1

## 2016-03-15 MED ORDER — AMLODIPINE BESYLATE 10 MG PO TABS
10.0000 mg | ORAL_TABLET | Freq: Every day | ORAL | Status: DC
  Administered 2016-03-16 – 2016-03-19 (×3): 10 mg via ORAL
  Filled 2016-03-15 (×3): qty 1

## 2016-03-15 MED ORDER — ACETAMINOPHEN 650 MG RE SUPP: 650.0000 mg | Freq: Four times a day (QID) | RECTAL | Status: DC | PRN

## 2016-03-15 MED ORDER — HALOPERIDOL 1 MG PO TABS
1.0000 mg | ORAL_TABLET | Freq: Two times a day (BID) | ORAL | Status: DC
  Administered 2016-03-15 – 2016-03-19 (×7): 1 mg via ORAL
  Filled 2016-03-15 (×8): qty 1

## 2016-03-15 MED ORDER — IOPAMIDOL (ISOVUE-300) INJECTION 61%: 100.0000 mL | Freq: Once | INTRAVENOUS | Status: DC | PRN

## 2016-03-15 NOTE — Progress Notes (Signed)
One tube of glucose given for FSBS of 63. Cecille Rubin, RN prev administered juice x 2 with no success. Will reassess in 15 min.

## 2016-03-15 NOTE — H&P (Signed)
[facility] [place] NAME: [name]    MR#:  [id number]  DATE OF BIRTH:  11-09-1960  DATE OF ADMISSION:  03/15/2016  PRIMARY CARE PHYSICIAN: Ellamae Sia, MD   REQUESTING/REFERRING PHYSICIAN: Dr Reita Cliche  CHIEF COMPLAINT:   Abdominal pain and diarrhea HISTORY OF PRESENT ILLNESS:  [name]  is a 55 y.o. female with a known history of Chronic pancreatitis, diet-controlled diabetes, ESRD on hemodialysis and CAD who presents with above complaint. Patient was seen in the emergency room yesterday for abdominal pain and nausea. She was sent home after pain medications and antiemetics. Patient reports she took 1 pain pill and that did help with her abdominal pain however this morning at 4:30 she will cope with increasing right upper quadrant abdominal pain and nausea. She also reports several episodes of diarrhea. She denies blood in her stool however does report that is dark colored. She denies vomiting blood. In the past she has been seen by gastroenterology and has had similar complaints of dyspepsia, abdominal pain and diarrhea. She was last hospitalized approximately one month ago for similar complaints. CT of the abdomen at that time did not reveal etiology of abdominal pain. Patient was treated symptomatically and reportedly had improved. She has also had fecal transplant for C. difficile in the past.  PAST MEDICAL HISTORY:   Past Medical History  Diagnosis Date  . Asthma   . Collagen vascular disease ([place])   . Diabetes mellitus without complication ([place])   . Hypertension   . Coronary artery disease     a. cath 2013: stenting to RCA (report not available); b. cath 2014: LM nl, pLAD 40%, mLAD nl, ost LCx 40%, mid LCx nl, pRCA 30% @ site of prior stent, mRCA 50%  . Diabetic neuropathy ([place])   . ESRD (end stage renal disease) on dialysis ([place])     M-W-F  . GERD (gastroesophageal reflux disease)   . Hx of pancreatitis 2015  . Myocardial  infarction ([place])   . Pneumonia   . Mitral regurgitation     a. echo 10/2013: EF 62%, noWMA, mildly dilated LA, mild to mod MR/TR, GR1DD  . COPD (chronic obstructive pulmonary disease) ([place])   . chronic diastolic CHF [id number]  . dialysis 2006  . Depression   . Anxiety   . Arthritis   . Headache   . Anemia   . Renal insufficiency     PAST SURGICAL HISTORY:   Past Surgical History  Procedure Laterality Date  . Cholecystectomy    . Appendectomy    . Abdominal hysterectomy    . Dialysis fistula creation Left     upper arm  . Esophagogastroduodenoscopy N/A 03/08/2015    Procedure: ESOPHAGOGASTRODUODENOSCOPY (EGD);  Surgeon: Manya Silvas, MD;  Location[facility] ENDOSCOPY;  Service: Endoscopy;  Laterality: N/A;  . Cardiac catheterization Left 07/26/2015    Procedure: Left Heart Cath and Coronary Angiography;  Surgeon: Dionisio David, MD;  Location: [facility] CV LAB;  Service: Cardiovascular;  Laterality: Left;  Marland Kitchen Eye surgery    . Fecal transplant N/A 08/23/2015    Procedure: FECAL TRANSPLANT;  Surgeon: Manya Silvas, MD;  Location: [facility] ENDOSCOPY;  Service: Endoscopy;  Laterality: N/A;  . Peripheral vascular catheterization N/A 12/20/2015    Procedure: Thrombectomy of dialysis access versus permcath placement;  Surgeon: Algernon Huxley, MD;  Location: [facility] LAB;  Service: Cardiovascular;  Laterality: N/A;  . Peripheral vascular catheterization N/A 12/20/2015  Procedure: A/V Shunt Intervention;  Surgeon: Algernon Huxley, MD;  Location: [place] [facility] LAB;  Service: Cardiovascular;  Laterality: N/A;  . Peripheral vascular catheterization N/A 12/20/2015    Procedure: A/V Shuntogram/Fistulagram;  Surgeon: Algernon Huxley, MD;  Location: [place] [facility] LAB;  Service: Cardiovascular;  Laterality: N/A;  . Peripheral vascular catheterization N/A 01/02/2016    Procedure: A/V Shuntogram/Fistulagram;  Surgeon: Algernon Huxley, MD;  Location: [place] [facility] LAB;  Service: Cardiovascular;   Laterality: N/A;  . Peripheral vascular catheterization N/A 01/02/2016    Procedure: A/V Shunt Intervention;  Surgeon: Algernon Huxley, MD;  Location: [facility] LAB;  Service: Cardiovascular;  Laterality: N/A;    SOCIAL HISTORY:   Social History  Substance Use Topics  . Smoking status: Former Smoker -- 0.50 packs/day    Types: Cigarettes    Quit date: 02/13/2015  . Smokeless tobacco: Never Used  . Alcohol Use: No    FAMILY HISTORY:   Family History  Problem Relation Age of Onset  . Kidney disease Mother   . Diabetes Mother     DRUG ALLERGIES:   Allergies  Allergen Reactions  . Compazine [Prochlorperazine Edisylate] Anaphylaxis and Nausea And Vomiting    23 Jul - patient relates that she takes promethazine frequently with no problems.  . Ace Inhibitors Swelling  . Ativan [Lorazepam] Other (See Comments)    Reaction:  Hallucinations and headaches  . Codeine Nausea And Vomiting  . Dilaudid [Hydromorphone Hcl] Other (See Comments)    Delirium  . Gabapentin Other (See Comments)    Reaction:  Unknown   . Losartan Other (See Comments)    Reaction:  Unknown   . Ondansetron Other (See Comments)    Reaction:  Unknown   . Prochlorperazine Other (See Comments)    Reaction:  Unknown   . Scopolamine Other (See Comments)    Reaction:  Unknown   . Zofran [Ondansetron Hcl] Other (See Comments)    Reaction:  hallucinations   . Oxycodone Anxiety  . Tape Rash    REVIEW OF SYSTEMS:   Review of Systems  Constitutional: Negative for fever, chills and malaise/fatigue.  HENT: Negative for ear discharge, ear pain, hearing loss, nosebleeds and sore throat.   Eyes: Negative for blurred vision and pain.  Respiratory: Negative for cough, hemoptysis, shortness of breath and wheezing.   Cardiovascular: Negative for chest pain, palpitations and leg swelling.  Gastrointestinal: Positive for nausea, vomiting, abdominal pain, diarrhea and melena. Negative for blood in stool.   Genitourinary: Negative for dysuria.  Musculoskeletal: Negative for back pain.  Neurological: Positive for weakness. Negative for dizziness, tremors, speech change, focal weakness, seizures and headaches.  Endo/Heme/Allergies: Does not bruise/bleed easily.  Psychiatric/Behavioral: Positive for depression. Negative for suicidal ideas and hallucinations.    MEDICATIONS AT HOME:   Prior to Admission medications   Medication Sig Start Date End Date Taking? Authorizing Provider  acidophilus (RISAQUAD) CAPS capsule Take 1 capsule by mouth daily.   Yes Historical Provider, MD  albuterol (PROVENTIL) (2.5 MG/3ML) 0.083% nebulizer solution Take 2.5 mg by nebulization every 4 (four) hours as needed for wheezing or shortness of breath.   Yes Historical Provider, MD  amLODipine (NORVASC) 10 MG tablet Take 10 mg by mouth daily.   Yes Historical Provider, MD  aspirin EC 81 MG tablet Take 81 mg by mouth every morning.    Yes Historical Provider, MD  atorvastatin (LIPITOR) 20 MG tablet Take 20 mg by mouth every evening.   Yes  Historical Provider, MD  B COMPLEX-C-FOLIC ACID PO Take 1 tablet by mouth daily.   Yes Historical Provider, MD  bisacodyl (DULCOLAX) 5 MG EC tablet Take 1 tablet (5 mg total) by mouth daily as needed for moderate constipation. 12/01/15  Yes Vaughan Basta, MD  cetirizine (ZYRTEC) 10 MG tablet Take 10 mg by mouth every evening.    Yes Historical Provider, MD  cholecalciferol (VITAMIN D) 1000 units tablet Take 1,000 Units by mouth daily.   Yes Historical Provider, MD  citalopram (CELEXA) 20 MG tablet Take 20 mg by mouth every morning.    Yes Historical Provider, MD  clopidogrel (PLAVIX) 75 MG tablet Take 75 mg by mouth daily. Reported on 12/17/2015   Yes Historical Provider, MD  hydrALAZINE (APRESOLINE) 25 MG tablet Take 25 mg by mouth 3 (three) times daily.   Yes Historical Provider, MD  HYDROcodone-acetaminophen (NORCO/VICODIN) 5-325 MG tablet Take 1 tablet by mouth every 6 (six)  hours as needed for moderate pain or severe pain.   Yes Historical Provider, MD  lipase/protease/amylase (CREON) 12000 UNITS CPEP capsule Take 12,000-36,000 Units by mouth See admin instructions. Take 3 capsules (36,000 units) by mouth every morning, Take 2 capsules (24,000 units) by mouth at lunch, Take 3 capsules (36,000 units) by mouth every night at bedtime, Then 1 capsule by mouth with snack.   Yes Historical Provider, MD  multivitamin (RENA-VIT) TABS tablet Take 1 tablet by mouth daily.   Yes Historical Provider, MD  nitroGLYCERIN (NITROSTAT) 0.4 MG SL tablet Place 0.4 mg under the tongue every 5 (five) minutes x 3 doses as needed for chest pain. *Max 3 doses per episode*   Yes Historical Provider, MD  omega-3 acid ethyl esters (LOVAZA) 1 g capsule Take 1 g by mouth daily.   Yes Historical Provider, MD  promethazine (PHENERGAN) 25 MG tablet Take 25 mg by mouth every 6 (six) hours as needed for nausea or vomiting.   Yes Historical Provider, MD  ranitidine (ZANTAC) 150 MG tablet Take 150 mg by mouth every evening.    Yes Historical Provider, MD  ranolazine (RANEXA) 1000 MG SR tablet Take 1,000 mg by mouth 2 (two) times daily.   Yes Historical Provider, MD  sevelamer carbonate (RENVELA) 800 MG tablet Take 800 mg by mouth 3 (three) times daily with meals.    Yes Historical Provider, MD  vitamin B-12 (CYANOCOBALAMIN) 1000 MCG tablet Take 1,000 mcg by mouth daily.   Yes Historical Provider, MD  oxymetazoline (AFRIN) 0.05 % nasal spray Place 2 sprays into both nostrils 2 (two) times daily. 12/29/15   Loney Hering, MD      VITAL SIGNS:  Blood pressure 141/57, pulse 69, temperature 97.6 F (36.4 C), resp. rate 17, height 5\' 5"  (1.651 m), weight 75.098 kg (165 lb 9 oz), SpO2 98 %.  PHYSICAL EXAMINATION:   Physical Exam  Constitutional: She is oriented to person, place, and time and well-developed, well-nourished, and in no distress. No distress.  HENT:  Head: Normocephalic.  Eyes: No scleral  icterus.  Neck: Normal range of motion. Neck supple. No JVD present. No tracheal deviation present.  Cardiovascular: Normal rate, regular rhythm and normal heart sounds.  Exam reveals no gallop and no friction rub.   No murmur heard. Pulmonary/Chest: Effort normal and breath sounds normal. No respiratory distress. She has no wheezes. She has no rales. She exhibits no tenderness.  Abdominal: Soft. Bowel sounds are normal. She exhibits no distension and no mass. There is tenderness. There is no  rebound and no guarding.  Musculoskeletal: Normal range of motion. She exhibits no edema.  Neurological: She is alert and oriented to person, place, and time.  Skin: Skin is warm. No rash noted. No erythema.  Psychiatric: Affect and judgment normal.      LABORATORY PANEL:   CBC  Recent Labs Lab 03/15/16 0742  WBC 5.2  HGB 9.9*  HCT 31.0*  PLT 192   ------------------------------------------------------------------------------------------------------------------  Chemistries   Recent Labs Lab 03/15/16 0742  NA 136  K 3.9  CL 97*  CO2 29  GLUCOSE 124*  BUN 26*  CREATININE 5.63*  CALCIUM 8.4*  AST 27  ALT 15  ALKPHOS 106  BILITOT 0.9   ------------------------------------------------------------------------------------------------------------------  Cardiac Enzymes  Recent Labs Lab 03/14/16 0810  TROPONINI <0.03   ------------------------------------------------------------------------------------------------------------------  RADIOLOGY:  No results found.  EKG:   Sinus rhythm right axis deviation and no ST elevation or depression  IMPRESSION AND PLAN:   54 year old female with ESRD on hemodialysis, ASCVD and chronic pancreatitis who presents with nausea, vomiting, abdominal pain and dark colored stools.  1. Abdominal pain: Patient does have tenderness on examination of the right upper quadrant. CT of the abdomen has been ordered by the ER physician which we will  follow-up on. Consult GI for further evaluation and recommendations.  2. Nausea with vomiting and diarrhea: Check C. difficile and stool cultures. There is a possibility that patient could be withdrawing from pain medications, however she says she ran out of pain medications for 1 day but was able to refill these. Lipase level is normal.  3. Diet-controlled diabetes: Check hemoglobin A1c. It does not appear that patient is in DKA. Sliding scale insulin.  4. ESRD on hemodialysis: Consult nephrology for dialysis. Patient did miss her dialysis yesterday and therefore will need dialysis today. She is also receiving contrast for CT scan.  5. Dark-colored stools: Hemoglobin remained stable since yesterday. Start PPI and consult GI. Follow hemoglobin.  6. Depression: Consult psychiatry for further evaluation. Patient is very tearful today. Continue Celexa for now . Check TSH and B12 level. 7. ASCVD: Continue Norvasc, atorvastatin, aspirin and Plavix. Since hemoglobin has remained stable since yesterday aspirin and Plavix can be continued. 8. History of chronic pancreatitis: Continue Creon.    All the records are reviewed and case discussed with ED provider. Management plans discussed with the patient and she in agreement  CODE STATUS: FULL  TOTAL TIME TAKING CARE OF THIS PATIENT: 50 minutes.    Jemarcus Dougal M.D on 03/15/2016 at 11:58 AM  Between 7am to 6pm - Pager - [phone]  After 6pm go to [email or web address] - password [email or web address] [facility]  Office  [phone]  CC: Primary care physician; Ellamae Sia, MD

## 2016-03-15 NOTE — ED Notes (Signed)
CT called and notified that patients bed is ready and that she is finished with her oral contrast. They state to call them once we are on the way transporting patient upstairs and they will complete the CT.

## 2016-03-15 NOTE — ED Notes (Signed)
This RN attempted IV access x 2 unsuccessfully

## 2016-03-15 NOTE — Progress Notes (Signed)
Pt still c/o abd pain epigastric area as well as leg pain described as neuropathy which is chronic and  her baseline. She has some neuropathy in her hands but not as painful as her legs. Dr Juleen China called the floor to inform us the patient will be going to dialysis on Monday. The last times she had dialysis was Wed 21. She has had  no bowel movements or vomiting episodes. Her appetite is currently poor and has refused some of her medications.

## 2016-03-15 NOTE — ED Provider Notes (Addendum)
[facility] Emergency Department Provider Note   ____________________________________________  Time seen: Approximately 7:30am I have reviewed the triage vital signs and the triage nursing note.  HISTORY  Chief Complaint Emesis   Historian Patient  HPI [name] is a 55 y.o. female with a history of DM, htn, CAD, ESRD who missed dialysis yesterday due to nausea and abdominal pain that was evaluated and treated with nausea and pain meds in the ER and released with diagnosis of acute on chronic abdominal pain/nausea.  She states that after Dilaudid and Phenergan in the ER yesterday she was able to sleep in the evening but then overnight she started having diffuse abdominal pain and nausea again.  Reports symptoms are moderate.  She feels weak all over, without focal weakness.  No fever.  No cough or trouble breathing.  She does make a small amount of urine.  She has had 3 small non bloody diarrhea movements overnight and this morning.  She reports multiple medication allergies to nausea medications and requests Dilaudid and phenergan by name.    Past Medical History  Diagnosis Date  . Asthma   . Collagen vascular disease ([place])   . Diabetes mellitus without complication ([place])   . Hypertension   . Coronary artery disease     a. cath 2013: stenting to RCA (report not available); b. cath 2014: LM nl, pLAD 40%, mLAD nl, ost LCx 40%, mid LCx nl, pRCA 30% @ site of prior stent, mRCA 50%  . Diabetic neuropathy ([place])   . ESRD (end stage renal disease) on dialysis ([place])     M-W-F  . GERD (gastroesophageal reflux disease)   . Hx of pancreatitis 2015  . Myocardial infarction ([place])   . Pneumonia   . Mitral regurgitation     a. echo 10/2013: EF 62%, noWMA, mildly dilated LA, mild to mod MR/TR, GR1DD  . COPD (chronic obstructive pulmonary disease) ([place])   . chronic diastolic CHF [id number]  . dialysis 2006  . Depression   . Anxiety   . Arthritis   . Headache    . Anemia   . Renal insufficiency     Patient Active Problem List   Diagnosis Date Noted  . Malnutrition of moderate degree 12/01/2015  . Renal mass   . Dyspnea   . Acute renal failure ([place])   . Respiratory failure ([place])   . High temperature 11/14/2015  . Pulmonary edema   . Encounter for central line placement   . Encounter for orogastric (OG) tube placement   . Nausea 11/12/2015  . Hyperkalemia 10/03/2015  . C. difficile diarrhea 07/22/2015  . Pneumonia 05/21/2015  . Hypoglycemia 04/24/2015  . Unresponsiveness 04/24/2015  . Bradycardia 04/24/2015  . Hypothermia 04/24/2015  . Acute respiratory failure ([place]) 04/24/2015  . Acute diastolic CHF (congestive heart failure) ([place]) 04/05/2015  . Diabetic gastroparesis ([place]) 04/05/2015  . Hypokalemia 04/05/2015  . Generalized weakness 04/05/2015  . Acute pulmonary edema ([place]) 04/03/2015  . Nausea and vomiting 04/03/2015  . Hypoglycemia associated with diabetes ([place]) 04/03/2015  . Anemia of chronic disease 04/03/2015  . Secondary hyperparathyroidism ([place]) 04/03/2015  . Pressure ulcer 04/02/2015  . Acute respiratory failure with hypoxia ([place]) 04/01/2015  . Adjustment disorder with anxiety 03/14/2015  . Somatic symptom disorder, mild 03/08/2015  . Coronary artery disease involving native coronary artery of native heart without angina pectoris   . Nausea & vomiting 03/06/2015  . Abdominal pain 03/06/2015  . DM (diabetes mellitus) ([place]) 03/06/2015  .  HTN (hypertension) 03/06/2015  . Gastroparesis 02/24/2015  . Pleural effusion 02/19/2015  . HCAP (healthcare-associated pneumonia) 02/19/2015  . End-stage renal disease on hemodialysis ([place]) 02/19/2015    Past Surgical History  Procedure Laterality Date  . Cholecystectomy    . Appendectomy    . Abdominal hysterectomy    . Dialysis fistula creation Left     upper arm  . Esophagogastroduodenoscopy N/A 03/08/2015    Procedure: ESOPHAGOGASTRODUODENOSCOPY (EGD);  Surgeon: Manya Silvas, MD;  Location: [facility] ENDOSCOPY;  Service: Endoscopy;  Laterality: N/A;  . Cardiac catheterization Left 07/26/2015    Procedure: Left Heart Cath and Coronary Angiography;  Surgeon: Dionisio David, MD;  Location: [place] CV LAB;  Service: Cardiovascular;  Laterality: Left;  Marland Kitchen Eye surgery    . Fecal transplant N/A 08/23/2015    Procedure: FECAL TRANSPLANT;  Surgeon: Manya Silvas, MD;  Location: [facility] ENDOSCOPY;  Service: Endoscopy;  Laterality: N/A;  . Peripheral vascular catheterization N/A 12/20/2015    Procedure: Thrombectomy of dialysis access versus permcath placement;  Surgeon: Algernon Huxley, MD;  Location: [place] CV LAB;  Service: Cardiovascular;  Laterality: N/A;  . Peripheral vascular catheterization N/A 12/20/2015    Procedure: A/V Shunt Intervention;  Surgeon: Algernon Huxley, MD;  Location: [place] CV LAB;  Service: Cardiovascular;  Laterality: N/A;  . Peripheral vascular catheterization N/A 12/20/2015    Procedure: A/V Shuntogram/Fistulagram;  Surgeon: Algernon Huxley, MD;  Location: [place] CV LAB;  Service: Cardiovascular;  Laterality: N/A;  . Peripheral vascular catheterization N/A 01/02/2016    Procedure: A/V Shuntogram/Fistulagram;  Surgeon: Algernon Huxley, MD;  Location: [place] CV LAB;  Service: Cardiovascular;  Laterality: N/A;  . Peripheral vascular catheterization N/A 01/02/2016    Procedure: A/V Shunt Intervention;  Surgeon: Algernon Huxley, MD;  Location: [place] CV LAB;  Service: Cardiovascular;  Laterality: N/A;    Current Outpatient Rx  Name  Route  Sig  Dispense  Refill  . acidophilus (RISAQUAD) CAPS capsule   Oral   Take 1 capsule by mouth daily.         Marland Kitchen albuterol (PROVENTIL) (2.5 MG/3ML) 0.083% nebulizer solution   Nebulization   Take 2.5 mg by nebulization every 4 (four) hours as needed for wheezing or shortness of breath.         Marland Kitchen amLODipine (NORVASC) 10 MG tablet   Oral   Take 10 mg by mouth daily.         Marland Kitchen aspirin EC 81  MG tablet   Oral   Take 81 mg by mouth every morning.          Marland Kitchen atorvastatin (LIPITOR) 20 MG tablet   Oral   Take 20 mg by mouth every evening.         . B COMPLEX-C-FOLIC ACID PO   Oral   Take 1 tablet by mouth daily.         . bisacodyl (DULCOLAX) 5 MG EC tablet   Oral   Take 1 tablet (5 mg total) by mouth daily as needed for moderate constipation.   30 tablet   0   . carvedilol (COREG) 3.125 MG tablet   Oral   Take 3.125 mg by mouth 2 (two) times daily with a meal.         . cetirizine (ZYRTEC) 10 MG tablet   Oral   Take 10 mg by mouth daily.         . cholecalciferol (VITAMIN D)  1000 units tablet   Oral   Take 1,000 Units by mouth daily.         . citalopram (CELEXA) 20 MG tablet   Oral   Take 20 mg by mouth every morning.          . clopidogrel (PLAVIX) 75 MG tablet   Oral   Take 75 mg by mouth daily. Reported on 12/17/2015         . hydrALAZINE (APRESOLINE) 25 MG tablet   Oral   Take 25 mg by mouth 3 (three) times daily.         Marland Kitchen HYDROcodone-acetaminophen (NORCO/VICODIN) 5-325 MG tablet   Oral   Take 1 tablet by mouth every 6 (six) hours as needed for moderate pain or severe pain.         Marland Kitchen insulin aspart (NOVOLOG FLEXPEN) 100 UNIT/ML FlexPen   Subcutaneous   Inject into the skin 3 (three) times daily with meals. Take as prescribed         . insulin detemir (LEVEMIR) 100 UNIT/ML injection   Subcutaneous   Inject 6 Units into the skin at bedtime.         . lipase/protease/amylase (CREON) 12000 UNITS CPEP capsule   Oral   Take 12,000 Units by mouth 3 (three) times daily with meals.          . meclizine (ANTIVERT) 25 MG tablet   Oral   Take 25 mg by mouth 2 (two) times daily.         . metoCLOPramide (REGLAN) 10 MG tablet   Oral   Take 10 mg by mouth 3 (three) times daily before meals. Take 30 minutes before each meal as needed for nausea         . metoprolol (LOPRESSOR) 50 MG tablet   Oral   Take 50 mg by mouth 2  (two) times daily.         . multivitamin (RENA-VIT) TABS tablet   Oral   Take 1 tablet by mouth daily.         . nitroGLYCERIN (NITROSTAT) 0.4 MG SL tablet   Sublingual   Place 0.4 mg under the tongue every 5 (five) minutes x 3 doses as needed for chest pain. *Max 3 doses per episode*         . omega-3 acid ethyl esters (LOVAZA) 1 g capsule   Oral   Take 1 g by mouth daily.         Marland Kitchen oxymetazoline (AFRIN) 0.05 % nasal spray   Each Nare   Place 2 sprays into both nostrils 2 (two) times daily.   15 mL   0   . promethazine (PHENERGAN) 25 MG tablet   Oral   Take 25 mg by mouth every 6 (six) hours as needed for nausea or vomiting.         . ranitidine (ZANTAC) 150 MG tablet   Oral   Take 150 mg by mouth every evening.          . ranolazine (RANEXA) 1000 MG SR tablet   Oral   Take 1,000 mg by mouth 2 (two) times daily.         . sevelamer carbonate (RENVELA) 800 MG tablet   Oral   Take 800 mg by mouth 3 (three) times daily with meals.          . vitamin B-12 (CYANOCOBALAMIN) 1000 MCG tablet   Oral   Take 1,000 mcg by mouth daily.  Allergies Compazine; Ace inhibitors; Ativan; Codeine; Dilaudid; Gabapentin; Losartan; Ondansetron; Prochlorperazine; Scopolamine; Zofran; Oxycodone; and Tape  Family History  Problem Relation Age of Onset  . Kidney disease Mother   . Diabetes Mother     Social History Social History  Substance Use Topics  . Smoking status: Former Smoker -- 0.50 packs/day    Types: Cigarettes    Quit date: 02/13/2015  . Smokeless tobacco: Never Used  . Alcohol Use: No    Review of Systems  Constitutional: Negative for fever. Eyes: Negative for visual changes. ENT: Negative for sore throat. Cardiovascular: Negative for chest pain. Respiratory: Negative for shortness of breath. Gastrointestinal: As per hpi Genitourinary: Negative for dysuria. Musculoskeletal: Negative for back pain. Skin: Negative for  rash. Neurological: Negative for headache. 10 point Review of Systems otherwise negative ____________________________________________   PHYSICAL EXAM:  VITAL SIGNS: ED Triage Vitals  Enc Vitals Group     BP 03/15/16 0714 144/56 mmHg     Pulse Rate 03/15/16 0714 72     Resp 03/15/16 0714 16     Temp 03/15/16 0714 97.6 F (36.4 C)     Temp src --      SpO2 03/15/16 0710 96 %     Weight 03/15/16 0714 165 lb 9 oz (75.098 kg)     Height 03/15/16 0714 5\' 5"  (1.651 m)     Head Cir --      Peak Flow --      Pain Score 03/15/16 0713 8     Pain Loc --      Pain Edu? --      Excl. in [place]? --      Constitutional: Alert and oriented. Hard of hearing.  No acute distress, but complaining of nausea and pain HEENT   Head: Normocephalic and atraumatic.      Eyes: Conjunctivae are normal. PERRL. Normal extraocular movements.      Ears:         Nose: No congestion/rhinnorhea.   Mouth/Throat: Mucous membranes are mildly dry.   Neck: No stridor. Cardiovascular/Chest: Normal rate, regular rhythm.  No murmurs, rubs, or gallops. Respiratory: Normal respiratory effort without tachypnea nor retractions. Breath sounds are clear and equal bilaterally. No wheezes/rales/rhonchi. Gastrointestinal: Soft. No distention, no guarding, no rebound. Diffusely tender, more so in the epigastrium than elsewhere.  Genitourinary/rectal:Deferred Musculoskeletal: Nontender with normal range of motion in all extremities. No joint effusions.  No lower extremity tenderness.  No edema. Neurologic:  Normal speech and language. No gross or focal neurologic deficits are appreciated.  Poor cooperation with strength, somewhat generalized weakness all over Skin:  Skin is warm, dry and intact. No rash noted. Psychiatric: Mood and affect are normal. Speech and behavior are normal. Patient exhibits appropriate insight and judgment.  ____________________________________________   EKG I, Lisa Roca, MD, the attending  physician have personally viewed and interpreted all ECGs.  71bpm.  normal axis narrow QRS. Nonspecific ST and T-wave ____________________________________________  LABS (pertinent positives/negatives)  Labs Reviewed  COMPREHENSIVE METABOLIC PANEL - Abnormal; Notable for the following:    Chloride 97 (*)    Glucose, Bld 124 (*)    BUN 26 (*)    Creatinine, Ser 5.63 (*)    Calcium 8.4 (*)    Albumin 3.1 (*)    GFR calc non Af Amer 8 (*)    GFR calc Af Amer 9 (*)    All other components within normal limits  CBC - Abnormal; Notable for the following:    Hemoglobin  9.9 (*)    HCT 31.0 (*)    MCV 78.9 (*)    MCH 25.2 (*)    RDW 21.3 (*)    All other components within normal limits  URINALYSIS COMPLETEWITH MICROSCOPIC ([facility] ONLY) - Abnormal; Notable for the following:    Color, Urine YELLOW (*)    APPearance CLEAR (*)    Glucose, UA 50 (*)    pH 9.0 (*)    Protein, ur 100 (*)    Bacteria, UA RARE (*)    All other components within normal limits  GASTROINTESTINAL PANEL BY PCR, STOOL (REPLACES STOOL CULTURE)  C DIFFICILE QUICK SCREEN W PCR REFLEX  LIPASE, BLOOD    ____________________________________________  RADIOLOGY All Xrays were viewed by me. Imaging interpreted by Radiologist.  None __________________________________________  PROCEDURES  Procedure(s) performed: None  Critical Care performed: None  ____________________________________________   ED COURSE / ASSESSMENT AND PLAN  Pertinent labs & imaging results that were available during my care of the patient were reviewed by me and considered in my medical decision making (see chart for details).   I saw Ms. Krawczyk yesterday for same complaints, although there is some more generalized weakness and diarrhea today in addition to the complaint of diffuse abd pain and nausea.  A febrile with stable VS, I will check labs due to missing dialysis.  I am going to add urinalysis and stool studies.   I did  discuss with her that I am not going to be treating her with narcotics for what I think is acute on chronic abdominal pain.  I will treat her for nausea and fluid hydration.  She's having intractable nausea and vomiting and I will go ahead and admit her for observation.    CONSULTATIONS:   Dr. Benjie Karvonen, hospitalist for admission   Patient / Family / Caregiver informed of clinical course, medical decision-making process, and agree with plan.   I discussed return precautions, follow-up instructions, and discharged instructions with patient and/or family.  Intended to include, after discussion with hospitalist, Dr. Benjie Karvonen, we elected to go ahead and image her abdomen given this is the second day and she is pretty tender especially on the right side. I entered the CT order. Hospitalist follow-up result. ___________________________________________   FINAL CLINICAL IMPRESSION(S) / ED DIAGNOSES   Final diagnoses:  Intractable vomiting with nausea, vomiting of unspecified type  Generalized abdominal pain              Note: This dictation was prepared with Dragon dictation. Any transcriptional errors that result from this process are unintentional   Lisa Roca, MD 03/15/16 [place], MD 03/15/16 1139

## 2016-03-15 NOTE — BH Assessment (Signed)
Writer informed psychiatrist (Dr. Pucilowska) of consult. 

## 2016-03-15 NOTE — ED Notes (Signed)
Patient seen yesterday for N/V. Patient states that during the night her nausea and vomiting has gotten worse. Patient also reports 3 episodes of diarrhea during the night. Patient is c/o abdominal pain RUQ and RLQ abdominal pain that is worse on palpation.

## 2016-03-15 NOTE — ED Notes (Signed)
Patient brought in by [facility] from home for N/V and abdominal pain, patient seen yesterday for the same. Patient states that she got worse this morning. Patient is dialysis patient, has not had dialysis since Wednesday.

## 2016-03-15 NOTE — ED Notes (Signed)
Patient transported to CT then to Room 145.

## 2016-03-15 NOTE — Consult Note (Signed)
[facility] Face-to-Face Psychiatry Consult   Reason for Consult:  Depression. Referring Physician:  Dr. Benjie Karvonen Patient Identification: [name] MRN:  [id number] Principal Diagnosis: Major depressive disorder, recurrent episode, moderate ([place]) Diagnosis:   Patient Active Problem List   Diagnosis Date Noted  . Major depressive disorder, recurrent episode, moderate ([place]) [F33.1] 03/15/2016  . Adjustment disorder with mixed anxiety and depressed mood [F43.23] 03/15/2016  . Malnutrition of moderate degree [E44.0] 12/01/2015  . Renal mass [N28.89]   . Dyspnea [R06.00]   . Acute renal failure ([place]) [N17.9]   . Respiratory failure ([place]) [J96.90]   . High temperature [R50.9] 11/14/2015  . Pulmonary edema [J81.1]   . Encounter for central line placement [Z45.2]   . Encounter for orogastric (OG) tube placement [Z46.59]   . Nausea [R11.0] 11/12/2015  . Hyperkalemia [E87.5] 10/03/2015  . C. difficile diarrhea [A04.7] 07/22/2015  . Pneumonia [J18.9] 05/21/2015  . Hypoglycemia [E16.2] 04/24/2015  . Unresponsiveness [R40.4] 04/24/2015  . Bradycardia [R00.1] 04/24/2015  . Hypothermia [T68.XXXA] 04/24/2015  . Acute respiratory failure ([place]) [J96.00] 04/24/2015  . Acute diastolic CHF (congestive heart failure) ([place]) [I50.31] 04/05/2015  . Diabetic gastroparesis ([place]) [E11.43, K31.84] 04/05/2015  . Hypokalemia [E87.6] 04/05/2015  . Generalized weakness [R53.1] 04/05/2015  . Acute pulmonary edema ([place]) [J81.0] 04/03/2015  . Nausea and vomiting [R11.2] 04/03/2015  . Hypoglycemia associated with diabetes ([place]) [E11.649] 04/03/2015  . Anemia of chronic disease [D63.8] 04/03/2015  . Secondary hyperparathyroidism ([place]) [N25.81] 04/03/2015  . Pressure ulcer [L89.90] 04/02/2015  . Acute respiratory failure with hypoxia ([place]) [J96.01] 04/01/2015  . Adjustment disorder with anxiety [F43.22] 03/14/2015  . Somatic symptom disorder, mild [F45.8] 03/08/2015  . Coronary artery disease involving native coronary  artery of native heart without angina pectoris [I25.10]   . Nausea & vomiting [R11.2] 03/06/2015  . Abdominal pain [R10.9] 03/06/2015  . DM (diabetes mellitus) ([place]) [E11.9] 03/06/2015  . HTN (hypertension) [I10] 03/06/2015  . Gastroparesis [K31.84] 02/24/2015  . Pleural effusion [J90] 02/19/2015  . HCAP (healthcare-associated pneumonia) [J18.9] 02/19/2015  . End-stage renal disease on hemodialysis ([place]) [N18.6, Z99.2] 02/19/2015    Total Time spent with patient: 45 minutes  Subjective:    Identifying data. [name] is a 55 y.o. female with a history of depression.  Chief complaint. "I am more depressed when I'm sick."  History of present illness. Information was obtained from the patient and the chart. The patient has a long history of depression that has been currently treated with Celexa with excellent results. The patient was admitted to the hospital for abdominal pain which has been a chronic problem with periodic exacerbation. She was very tearful on interview with Dr. Benjie Karvonen. The patient reported that she's been rather upset when feeling sick. In addition she developed some new symptoms. She complains of seeing people with a corner of her eye but knows that she is "not crazy" and there is nobody in the room. She also hears music in her ear all the time. They are different tunes that she cannot get out of her head. She complains of severe vertigo and is asking if I can prescribe something for that as well. She feels somewhat depressed and upset and anxious because of the situation but denies symptoms of depression or severe anxiety. She denies symptoms suggestive of bipolar mania. She does not use alcohol or illicit drugs.  Past psychiatric history. She was hospitalized twice at [facility] psychiatry unit. Both times under duress preoccupied with family problems. She had  been in the care of psychiatrists for many years. She does not remember medications prescribed  but likes her Celexa. She never attempted suicide. One time before she had psychotic symptoms similar to what she experiences now. It was again in the context of physical illness.  Family psychiatric history. Nonreported.  Social history. She is retired. She lives with her son. She has health insurance.  Risk to Self: Is patient at risk for suicide?: No Risk to Others:   Prior Inpatient Therapy:   Prior Outpatient Therapy:    Past Medical History:  Past Medical History  Diagnosis Date  . Asthma   . Collagen vascular disease ([place])   . Diabetes mellitus without complication ([place])   . Hypertension   . Coronary artery disease     a. cath 2013: stenting to RCA (report not available); b. cath 2014: LM nl, pLAD 40%, mLAD nl, ost LCx 40%, mid LCx nl, pRCA 30% @ site of prior stent, mRCA 50%  . Diabetic neuropathy ([place])   . ESRD (end stage renal disease) on dialysis ([place])     M-W-F  . GERD (gastroesophageal reflux disease)   . Hx of pancreatitis 2015  . Myocardial infarction ([place])   . Pneumonia   . Mitral regurgitation     a. echo 10/2013: EF 62%, noWMA, mildly dilated LA, mild to mod MR/TR, GR1DD  . COPD (chronic obstructive pulmonary disease) ([place])   . chronic diastolic CHF 2/33/0076  . dialysis 2006  . Depression   . Anxiety   . Arthritis   . Headache   . Anemia   . Renal insufficiency     Past Surgical History  Procedure Laterality Date  . Cholecystectomy    . Appendectomy    . Abdominal hysterectomy    . Dialysis fistula creation Left     upper arm  . Esophagogastroduodenoscopy N/A 03/08/2015    Procedure: ESOPHAGOGASTRODUODENOSCOPY (EGD);  Surgeon: Manya Silvas, MD;  Location: [facility] ENDOSCOPY;  Service: Endoscopy;  Laterality: N/A;  . Cardiac catheterization Left 07/26/2015    Procedure: Left Heart Cath and Coronary Angiography;  Surgeon: Dionisio David, MD;  Location: [place] CV LAB;  Service: Cardiovascular;  Laterality: Left;  Marland Kitchen Eye surgery    . Fecal  transplant N/A 08/23/2015    Procedure: FECAL TRANSPLANT;  Surgeon: Manya Silvas, MD;  Location: [facility] ENDOSCOPY;  Service: Endoscopy;  Laterality: N/A;  . Peripheral vascular catheterization N/A 12/20/2015    Procedure: Thrombectomy of dialysis access versus permcath placement;  Surgeon: Algernon Huxley, MD;  Location: [place] CV LAB;  Service: Cardiovascular;  Laterality: N/A;  . Peripheral vascular catheterization N/A 12/20/2015    Procedure: A/V Shunt Intervention;  Surgeon: Algernon Huxley, MD;  Location: [place] CV LAB;  Service: Cardiovascular;  Laterality: N/A;  . Peripheral vascular catheterization N/A 12/20/2015    Procedure: A/V Shuntogram/Fistulagram;  Surgeon: Algernon Huxley, MD;  Location: [place] CV LAB;  Service: Cardiovascular;  Laterality: N/A;  . Peripheral vascular catheterization N/A 01/02/2016    Procedure: A/V Shuntogram/Fistulagram;  Surgeon: Algernon Huxley, MD;  Location: [place] CV LAB;  Service: Cardiovascular;  Laterality: N/A;  . Peripheral vascular catheterization N/A 01/02/2016    Procedure: A/V Shunt Intervention;  Surgeon: Algernon Huxley, MD;  Location: [place] CV LAB;  Service: Cardiovascular;  Laterality: N/A;   Family History:  Family History  Problem Relation Age of Onset  . Kidney disease Mother   . Diabetes Mother  Social History:  History  Alcohol Use No     History  Drug Use No    Social History   Social History  . Marital Status: Divorced    Spouse Name: N/A  . Number of Children: N/A  . Years of Education: N/A   Social History Main Topics  . Smoking status: Former Smoker -- 0.50 packs/day    Types: Cigarettes    Quit date: 02/13/2015  . Smokeless tobacco: Never Used  . Alcohol Use: No  . Drug Use: No  . Sexual Activity: Not Asked   Other Topics Concern  . None   Social History Narrative   Additional Social History:    Allergies:   Allergies  Allergen Reactions  . Compazine [Prochlorperazine Edisylate]  Anaphylaxis and Nausea And Vomiting    23 Jul - patient relates that she takes promethazine frequently with no problems.  . Ace Inhibitors Swelling  . Ativan [Lorazepam] Other (See Comments)    Reaction:  Hallucinations and headaches  . Codeine Nausea And Vomiting  . Dilaudid [Hydromorphone Hcl] Other (See Comments)    Delirium  . Gabapentin Other (See Comments)    Reaction:  Unknown   . Losartan Other (See Comments)    Reaction:  Unknown   . Ondansetron Other (See Comments)    Reaction:  Unknown   . Prochlorperazine Other (See Comments)    Reaction:  Unknown   . Scopolamine Other (See Comments)    Reaction:  Unknown   . Zofran [Ondansetron Hcl] Other (See Comments)    Reaction:  hallucinations   . Oxycodone Anxiety  . Tape Rash    Labs:  Results for orders placed or performed during the hospital encounter of 03/15/16 (from the past 48 hour(s))  Lipase, blood     Status: None   Collection Time: 03/15/16  7:42 AM  Result Value Ref Range   Lipase 18 11 - 51 U/L  Comprehensive metabolic panel     Status: Abnormal   Collection Time: 03/15/16  7:42 AM  Result Value Ref Range   Sodium 136 135 - 145 mmol/L   Potassium 3.9 3.5 - 5.1 mmol/L   Chloride 97 (L) 101 - 111 mmol/L   CO2 29 22 - 32 mmol/L   Glucose, Bld 124 (H) 65 - 99 mg/dL   BUN 26 (H) 6 - 20 mg/dL   Creatinine, Ser 5.63 (H) 0.44 - 1.00 mg/dL   Calcium 8.4 (L) 8.9 - 10.3 mg/dL   Total Protein 7.8 6.5 - 8.1 g/dL   Albumin 3.1 (L) 3.5 - 5.0 g/dL   AST 27 15 - 41 U/L   ALT 15 14 - 54 U/L   Alkaline Phosphatase 106 38 - 126 U/L   Total Bilirubin 0.9 0.3 - 1.2 mg/dL   GFR calc non Af Amer 8 (L) >60 mL/min   GFR calc Af Amer 9 (L) >60 mL/min    Comment: (NOTE) The eGFR has been calculated using the CKD EPI equation. This calculation has not been validated in all clinical situations. eGFR's persistently <60 mL/min signify possible Chronic Kidney Disease.    Anion gap 10 5 - 15  CBC     Status: Abnormal    Collection Time: 03/15/16  7:42 AM  Result Value Ref Range   WBC 5.2 3.6 - 11.0 K/uL   RBC 3.94 3.80 - 5.20 MIL/uL   Hemoglobin 9.9 (L) 12.0 - 16.0 g/dL   HCT 31.0 (L) 35.0 - 47.0 %   MCV 78.9 (  L) 80.0 - 100.0 fL   MCH 25.2 (L) 26.0 - 34.0 pg   MCHC 32.0 32.0 - 36.0 g/dL   RDW 21.3 (H) 11.5 - 14.5 %   Platelets 192 150 - 440 K/uL  Urinalysis complete, with microscopic     Status: Abnormal   Collection Time: 03/15/16  7:42 AM  Result Value Ref Range   Color, Urine YELLOW (A) YELLOW   APPearance CLEAR (A) CLEAR   Glucose, UA 50 (A) NEGATIVE mg/dL   Bilirubin Urine NEGATIVE NEGATIVE   Ketones, ur NEGATIVE NEGATIVE mg/dL   Specific Gravity, Urine 1.010 1.005 - 1.030   Hgb urine dipstick NEGATIVE NEGATIVE   pH 9.0 (H) 5.0 - 8.0   Protein, ur 100 (A) NEGATIVE mg/dL   Nitrite NEGATIVE NEGATIVE   Leukocytes, UA NEGATIVE NEGATIVE   RBC / HPF 0-5 0 - 5 RBC/hpf   WBC, UA 0-5 0 - 5 WBC/hpf   Bacteria, UA RARE (A) NONE SEEN   Squamous Epithelial / LPF NONE SEEN NONE SEEN   Hyaline Casts, UA PRESENT   TSH     Status: None   Collection Time: 03/15/16  7:42 AM  Result Value Ref Range   TSH 1.280 0.350 - 4.500 uIU/mL  Hemoglobin A1c     Status: None   Collection Time: 03/15/16  7:42 AM  Result Value Ref Range   Hgb A1c MFr Bld 4.6 4.0 - 6.0 %  Glucose, capillary     Status: None   Collection Time: 03/15/16  4:05 PM  Result Value Ref Range   Glucose-Capillary 80 65 - 99 mg/dL    Current Facility-Administered Medications  Medication Dose Route Frequency Provider Last Rate Last Dose  . acetaminophen (TYLENOL) tablet 650 mg  650 mg Oral Q6H PRN Bettey Costa, MD       Or  . acetaminophen (TYLENOL) suppository 650 mg  650 mg Rectal Q6H PRN Bettey Costa, MD      . acidophilus (RISAQUAD) capsule 1 capsule  1 capsule Oral Daily Bettey Costa, MD   1 capsule at 03/15/16 1401  . albuterol (PROVENTIL) (2.5 MG/3ML) 0.083% nebulizer solution 2.5 mg  2.5 mg Nebulization Q4H PRN Sital Mody, MD      .  amLODipine (NORVASC) tablet 10 mg  10 mg Oral Daily Bettey Costa, MD   10 mg at 03/15/16 1401  . [START ON 03/16/2016] aspirin EC tablet 81 mg  81 mg Oral BH-q7a Bettey Costa, MD      . atorvastatin (LIPITOR) tablet 20 mg  20 mg Oral QPM Sital Mody, MD      . bisacodyl (DULCOLAX) EC tablet 5 mg  5 mg Oral Daily PRN Bettey Costa, MD      . bisacodyl (DULCOLAX) EC tablet 5 mg  5 mg Oral Daily PRN Bettey Costa, MD      . cholecalciferol (VITAMIN D) tablet 1,000 Units  1,000 Units Oral Daily Bettey Costa, MD   1,000 Units at 03/15/16 1402  . [START ON 03/16/2016] citalopram (CELEXA) tablet 20 mg  20 mg Oral BH-q7a Sital Mody, MD      . clopidogrel (PLAVIX) tablet 75 mg  75 mg Oral Daily Bettey Costa, MD   75 mg at 03/15/16 1641  . docusate sodium (COLACE) capsule 100 mg  100 mg Oral BID Bettey Costa, MD      . heparin injection 5,000 Units  5,000 Units Subcutaneous Q8H Bettey Costa, MD   5,000 Units at 03/15/16 1603  . hydrALAZINE (APRESOLINE) tablet  25 mg  25 mg Oral TID Bettey Costa, MD   25 mg at 03/15/16 1642  . HYDROmorphone (DILAUDID) injection 0.5 mg  0.5 mg Intravenous Q4H PRN Bettey Costa, MD   0.5 mg at 03/15/16 1433  . insulin aspart (novoLOG) injection 0-5 Units  0-5 Units Subcutaneous QHS Sital Mody, MD      . insulin aspart (novoLOG) injection 0-9 Units  0-9 Units Subcutaneous TID WC Bettey Costa, MD   0 Units at 03/15/16 1647  . iopamidol (ISOVUE-300) 61 % injection 100 mL  100 mL Intravenous Once PRN Bettey Costa, MD      . lipase/protease/amylase (CREON) capsule 12,000 Units  12,000 Units Oral With snacks Bettey Costa, MD      . Derrill Memo ON 03/16/2016] lipase/protease/amylase (CREON) capsule 24,000 Units  24,000 Units Oral Q lunch Sital Mody, MD      . lipase/protease/amylase (CREON) capsule 36,000 Units  36,000 Units Oral BID AC & HS Sital Mody, MD      . loratadine (CLARITIN) tablet 10 mg  10 mg Oral Daily Bettey Costa, MD   10 mg at 03/15/16 1403  . metoCLOPramide (REGLAN) injection 10 mg  10 mg Intravenous Once  Lisa Roca, MD   10 mg at 03/15/16 0820  . multivitamin (RENA-VIT) tablet 1 tablet  1 tablet Oral Daily Bettey Costa, MD   1 tablet at 03/15/16 1403  . nitroGLYCERIN (NITROSTAT) SL tablet 0.4 mg  0.4 mg Sublingual Q5 Min x 3 PRN Bettey Costa, MD      . omega-3 acid ethyl esters (LOVAZA) capsule 1 g  1 g Oral Daily Bettey Costa, MD   1 g at 03/15/16 1605  . pantoprazole (PROTONIX) EC tablet 40 mg  40 mg Oral BID AC Bettey Costa, MD   40 mg at 03/15/16 1642  . polyethylene glycol (MIRALAX / GLYCOLAX) packet 17 g  17 g Oral Daily PRN Bettey Costa, MD      . promethazine (PHENERGAN) tablet 25 mg  25 mg Oral Q6H PRN Bettey Costa, MD   25 mg at 03/15/16 1642  . ranolazine (RANEXA) 12 hr tablet 1,000 mg  1,000 mg Oral BID Bettey Costa, MD   1,000 mg at 03/15/16 1606  . sevelamer carbonate (RENVELA) tablet 800 mg  800 mg Oral TID WC Bettey Costa, MD   800 mg at 03/15/16 1647  . sodium phosphate (FLEET) 7-19 GM/118ML enema 1 enema  1 enema Rectal Once PRN Bettey Costa, MD      . vitamin B-12 (CYANOCOBALAMIN) tablet 1,000 mcg  1,000 mcg Oral Daily Bettey Costa, MD   1,000 mcg at 03/15/16 1406    Musculoskeletal: Strength & Muscle Tone: within normal limits Gait & Station: normal Patient leans: N/A  Psychiatric Specialty Exam: Reviewed physical exam performed by the medical staff and agree with her findings. Physical Exam  Nursing note and vitals reviewed.   Review of Systems  Gastrointestinal: Positive for nausea, vomiting and abdominal pain.  Psychiatric/Behavioral: Positive for depression and hallucinations.  All other systems reviewed and are negative.   Blood pressure 144/58, pulse 67, temperature 96.8 F (36 C), temperature source Oral, resp. rate 20, height 5' 5"  (1.651 m), weight 75.098 kg (165 lb 9 oz), SpO2 98 %.Body mass index is 27.55 kg/(m^2).  General Appearance: Casual  Eye Contact:  Good  Speech:  Clear and Coherent  Volume:  Normal  Mood:  Anxious  Affect:  Appropriate  Thought Process:  Goal  Directed  Orientation:  Full (Time,  Place, and Person)  Thought Content:  Hallucinations: Auditory Visual  Suicidal Thoughts:  No  Homicidal Thoughts:  No  Memory:  Immediate;   Fair Recent;   Fair Remote;   Fair  Judgement:  Fair  Insight:  Fair  Psychomotor Activity:  Normal  Concentration:  Concentration: Fair and Attention Span: Fair  Recall:  [employer] of Knowledge:  Fair  Language:  Fair  Akathisia:  No  Handed:  Right  AIMS (if indicated):     Assets:  Communication Skills Desire for Improvement Financial Resources/Insurance Housing Physical Health Resilience Social Support  ADL's:  Intact  Cognition:  WNL  Sleep:        Treatment Plan Summary: Daily contact with patient to assess and evaluate symptoms and progress in treatment and Medication management  Disposition: No evidence of imminent risk to self or others at present.   Patient does not meet criteria for psychiatric inpatient admission. Supportive therapy provided about ongoing stressors. Discussed crisis plan, support from social network, calling 911, coming to the Emergency Department, and calling Suicide Hotline.   PLAN: 1. Please continue Celexa as in the community for depression and anxiety.  2. I will start low dose Haldol for visual and auditory hallucinations.  3. Will follow up tomorrow.   Orson Slick, MD 03/15/2016 5:38 PM

## 2016-03-16 LAB — GASTROINTESTINAL PANEL BY PCR, STOOL (REPLACES STOOL CULTURE)
Adenovirus F40/41: NOT DETECTED
Astrovirus: NOT DETECTED
Campylobacter species: NOT DETECTED
Cryptosporidium: NOT DETECTED
Cyclospora cayetanensis: NOT DETECTED
E. coli O157: NOT DETECTED
Entamoeba histolytica: NOT DETECTED
Enteroaggregative E coli (EAEC): NOT DETECTED
Enteropathogenic E coli (EPEC): NOT DETECTED
Enterotoxigenic E coli (ETEC): NOT DETECTED
Giardia lamblia: NOT DETECTED
Norovirus GI/GII: NOT DETECTED
Plesimonas shigelloides: NOT DETECTED
Rotavirus A: NOT DETECTED
Salmonella species: NOT DETECTED
Sapovirus (I, II, IV, and V): NOT DETECTED
Shiga like toxin producing E coli (STEC): NOT DETECTED
Shigella/Enteroinvasive E coli (EIEC): NOT DETECTED
Vibrio cholerae: NOT DETECTED
Vibrio species: NOT DETECTED
Yersinia enterocolitica: NOT DETECTED

## 2016-03-16 LAB — GLUCOSE, CAPILLARY
Glucose-Capillary: 60 mg/dL — ABNORMAL LOW (ref 65–99)
Glucose-Capillary: 60 mg/dL — ABNORMAL LOW (ref 65–99)
Glucose-Capillary: 61 mg/dL — ABNORMAL LOW (ref 65–99)
Glucose-Capillary: 68 mg/dL (ref 65–99)
Glucose-Capillary: 78 mg/dL (ref 65–99)
Glucose-Capillary: 81 mg/dL (ref 65–99)
Glucose-Capillary: 119 mg/dL — ABNORMAL HIGH (ref 65–99)

## 2016-03-16 LAB — C DIFFICILE QUICK SCREEN W PCR REFLEX
C Diff antigen: POSITIVE — AB
C Diff toxin: NEGATIVE

## 2016-03-16 LAB — CBC
HCT: 31.5 % — ABNORMAL LOW (ref 35.0–47.0)
Hemoglobin: 10 g/dL — ABNORMAL LOW (ref 12.0–16.0)
MCH: 25 pg — ABNORMAL LOW (ref 26.0–34.0)
MCHC: 31.9 g/dL — ABNORMAL LOW (ref 32.0–36.0)
MCV: 78.3 fL — ABNORMAL LOW (ref 80.0–100.0)
Platelets: 194 10*3/uL (ref 150–440)
RBC: 4.02 MIL/uL (ref 3.80–5.20)
RDW: 21.4 % — ABNORMAL HIGH (ref 11.5–14.5)
WBC: 5.1 10*3/uL (ref 3.6–11.0)

## 2016-03-16 LAB — CLOSTRIDIUM DIFFICILE BY PCR: Toxigenic C. Difficile by PCR: POSITIVE — AB

## 2016-03-16 LAB — OCCULT BLOOD X 1 CARD TO LAB, STOOL: Fecal Occult Bld: NEGATIVE

## 2016-03-16 MED ORDER — GLUCOSE 40 % PO GEL
1.0000 | ORAL | Status: AC
  Administered 2016-03-16: 37.5 g via ORAL
  Filled 2016-03-16: qty 1

## 2016-03-16 MED ORDER — DEXTROSE-NACL 5-0.45 % IV SOLN
INTRAVENOUS | Status: DC
  Administered 2016-03-16 – 2016-03-18 (×3): via INTRAVENOUS

## 2016-03-16 MED ORDER — VANCOMYCIN 50 MG/ML ORAL SOLUTION
500.0000 mg | Freq: Three times a day (TID) | ORAL | Status: DC
  Administered 2016-03-16 – 2016-03-17 (×3): 500 mg via ORAL
  Filled 2016-03-16 (×6): qty 10

## 2016-03-16 MED ORDER — METOCLOPRAMIDE HCL 10 MG PO TABS
5.0000 mg | ORAL_TABLET | Freq: Three times a day (TID) | ORAL | Status: DC
  Administered 2016-03-16 – 2016-03-19 (×8): 5 mg via ORAL
  Filled 2016-03-16 (×5): qty 1
  Filled 2016-03-16: qty 2
  Filled 2016-03-16: qty 0.5
  Filled 2016-03-16 (×2): qty 1

## 2016-03-16 NOTE — Consult Note (Signed)
  [name] denies symptoms of depression or anxiety. She is not suicidal or homicidal. She complains mostly of physical discomfort in the abdomen nausea and vomiting. She refused to eat lunch. Yesterday she told me about auditory hallucinations, hearing songs in her head all the time, and visual hallucinations, seeing crowds of people with a corner of her eye. And that this happens only when she is physically sick. She was started on Haldol 1 mg twice daily. Today she did not see any crowds by would see her daughter and granddaughter even though they were not there. She still hears the music.  PLAN: 1. The patient does not meet criteria for psychiatric hospitalization. Discharge is appropriate.  2. We'll continue Celexa as prescribed by her primary provider and Haldol for now.  3. Dr. Shelbie Ammons will follow along on Monday.

## 2016-03-16 NOTE — Progress Notes (Signed)
New orders received for continuous  fluids @ 50 ml. Pt is a hemodialysis pt . Nurse paged  MD Posey Pronto to confirm order. Per MD ok to carry out.

## 2016-03-16 NOTE — Care Management Note (Signed)
Case Management Note  Patient Details  Name: [name] MRN: [id number] Date of Birth: December 29, 1960  Subjective/Objective:      Currently open to [facility] for [place]. Discussed this admission with Santiago Glad at [facility] and they will put her on their High Risk list after this hospital discharge to follow up to make sure she is going to dialysis and is not out of her meds.               Action/Plan:   Expected Discharge Date:                  Expected Discharge Plan:     In-House Referral:     Discharge planning Services     Post Acute Care Choice:    Choice offered to:     DME Arranged:    DME Agency:     HH Arranged:    HH Agency:     Status of Service:     If discussed at [employer] of Stay Meetings, dates discussed:    Additional Comments:  Delayni Streed A, RN 03/16/2016, 5:08 PM

## 2016-03-16 NOTE — Progress Notes (Signed)
[facility]  ROUNDING NOTE   Subjective:   Admitted for nausea/vomiting/abdominal pain. Was taken off metoclopramide last week due to extra-pyramidal side effects.  Patient missed dialysis on Friday.   Objective:  Vital signs in last 24 hours:  Temp:  [96.3 F (35.7 C)-97.2 F (36.2 C)] 97 F (36.1 C) (06/25 0742) Pulse Rate:  [67-72] 72 (06/25 0742) Resp:  [17-30] 20 (06/25 0742) BP: (129-144)/(46-78) 142/78 mmHg (06/25 0742) SpO2:  [91 %-99 %] 96 % (06/25 0742)  Weight change:  Filed Weights   03/15/16 0714  Weight: 75.098 kg (165 lb 9 oz)    Intake/Output:     Intake/Output this shift:     Physical Exam: General: NAD, laying in bed  Head: Normocephalic, atraumatic. Moist oral mucosal membranes  Eyes: Anicteric, PERRL  Neck: Supple, trachea midline  Lungs:  Clear to auscultation  Heart: Regular rate and rhythm  Abdomen:  Soft, nontender, obese  Extremities: no peripheral edema.  Neurologic: Nonfocal, moving all four extremities  Skin: No lesions  Access: Left AVG    Basic Metabolic Panel:  Recent Labs Lab 03/14/16 0810 03/15/16 0742  NA 133* 136  K 3.8 3.9  CL 94* 97*  CO2 30 29  GLUCOSE 80 124*  BUN 20 26*  CREATININE 4.46* 5.63*  CALCIUM 8.5* 8.4*    Liver Function Tests:  Recent Labs Lab 03/14/16 0810 03/15/16 0742  AST 22 27  ALT 14 15  ALKPHOS 104 106  BILITOT 0.8 0.9  PROT 7.9 7.8  ALBUMIN 3.1* 3.1*    Recent Labs Lab 03/14/16 0810 03/15/16 0742  LIPASE <10* 18   No results for input(s): AMMONIA in the last 168 hours.  CBC:  Recent Labs Lab 03/14/16 0810 03/15/16 0742 03/16/16 0404  WBC 6.1 5.2 5.1  NEUTROABS 2.7  --   --   HGB 9.9* 9.9* 10.0*  HCT 31.0* 31.0* 31.5*  MCV 77.4* 78.9* 78.3*  PLT 202 192 194    Cardiac Enzymes:  Recent Labs Lab 03/14/16 0810  TROPONINI <0.03    BNP: Invalid input(s): POCBNP  CBG:  Recent Labs Lab 03/15/16 2215 03/15/16 2347 03/16/16 0735  03/16/16 0736 03/16/16 0824  GLUCAP 63* 77 60* 60* 18*    Microbiology: Results for orders placed or performed during the hospital encounter of 02/16/16  MRSA PCR Screening     Status: None   Collection Time: 02/16/16  7:00 PM  Result Value Ref Range Status   MRSA by PCR NEGATIVE NEGATIVE Final    Comment:        The GeneXpert MRSA Assay (FDA approved for NASAL specimens only), is one component of a comprehensive MRSA colonization surveillance program. It is not intended to diagnose MRSA infection nor to guide or monitor treatment for MRSA infections.   Culture, body fluid-bottle     Status: None   Collection Time: 02/17/16  3:05 PM  Result Value Ref Range Status   Specimen Description PLEURAL  Final   Special Requests NONE  Final   Gram Stain MANY WBC SEEN NO ORGANISMS SEEN   Final   Culture NO GROWTH 5 DAYS  Final   Report Status 02/22/2016 FINAL  Final    Coagulation Studies: No results for input(s): LABPROT, INR in the last 72 hours.  Urinalysis:  Recent Labs  03/15/16 0742  COLORURINE YELLOW*  LABSPEC 1.010  PHURINE 9.0*  GLUCOSEU 50*  HGBUR NEGATIVE  BILIRUBINUR NEGATIVE  KETONESUR NEGATIVE  PROTEINUR 100*  NITRITE NEGATIVE  LEUKOCYTESUR  NEGATIVE      Imaging: Ct Abdomen Pelvis Wo Contrast  03/15/2016  CLINICAL DATA:  Right abdominal pain. EXAM: CT ABDOMEN AND PELVIS WITHOUT CONTRAST TECHNIQUE: Multidetector CT imaging of the abdomen and pelvis was performed following the standard protocol without IV contrast. COMPARISON:  02/16/2016 CT abdomen/ pelvis. FINDINGS: Lower chest: Bilateral loculated pleural effusions at the lung bases, small and stable on the right and moderate and increased on the left. Stable chronic mild pleural thickening on the right. Compressive atelectasis at both lung bases, left greater than right. Ground-glass attenuation in both lung bases may represent pulmonary edema. Small pericardial effusion/ thickening, not appreciably  changed. Right coronary atherosclerosis. Hepatobiliary: Normal liver with no liver mass. Cholecystectomy . Bile ducts are stable and within expected post cholecystectomy limits, with common bile duct diameter 6 mm. No radiopaque choledocholithiasis. The Pancreas: Mild haziness of the peripancreatic fat, unchanged. No peripancreatic fluid collections. No pancreatic mass or duct dilation. Spleen: Normal size. No mass. Adrenals/Urinary Tract: Normal adrenals. Symmetric prominent bilateral renal atrophy. No hydronephrosis. No contour deforming renal mass. Normal bladder. Stomach/Bowel: Grossly normal stomach. Normal caliber small bowel with no small bowel wall thickening. Appendectomy. Collapsed large bowel. Mild sigmoid diverticulosis. No definite large bowel wall thickening. Vascular/Lymphatic: Atherosclerotic nonaneurysmal abdominal aorta. No pathologically enlarged lymph nodes in the abdomen or pelvis. Reproductive: Status post hysterectomy, with no abnormal findings at the vaginal cuff. No adnexal mass. Other: No pneumoperitoneum, ascites or focal fluid collection. Mild to moderate anasarca, unchanged. Foci of subcutaneous soft tissue attenuation in the right ventral abdominal wall are nonspecific, probably due to subcutaneous injection of medications. Musculoskeletal: No aggressive appearing focal osseous lesions. Partial ankylosis of the disc space at T11-12, unchanged. IMPRESSION: 1. Loculated pleural effusions and atelectasis at both lung bases, stable and small on the right, and increased and small to moderate on the left. 2. Stable small pericardial effusion/thickening, partially visualized. 3. Nonspecific haziness of the peripancreatic fat, unchanged from 02/16/2016, probably due to noninflammatory edema given the chronicity and presence of anasarca, although correlation with lipase is recommended to exclude acute pancreatitis. No peripancreatic fluid collections. 4. No evidence of bowel obstruction or acute  bowel inflammation. Mild sigmoid diverticulosis with no evidence of acute diverticulitis. 5. Aortic atherosclerosis.  Coronary atherosclerosis. Electronically Signed   By: Ilona Sorrel M.D.   On: 03/15/2016 16:04     Medications:     . acidophilus  1 capsule Oral Daily  . amLODipine  10 mg Oral Daily  . aspirin EC  81 mg Oral BH-q7a  . atorvastatin  20 mg Oral QPM  . cholecalciferol  1,000 Units Oral Daily  . citalopram  20 mg Oral BH-q7a  . clopidogrel  75 mg Oral Daily  . docusate sodium  100 mg Oral BID  . haloperidol  1 mg Oral BID  . heparin  5,000 Units Subcutaneous Q8H  . hydrALAZINE  25 mg Oral TID  . insulin aspart  0-5 Units Subcutaneous QHS  . insulin aspart  0-9 Units Subcutaneous TID WC  . lipase/protease/amylase  12,000 Units Oral With snacks  . lipase/protease/amylase  24,000 Units Oral Q lunch  . lipase/protease/amylase  36,000 Units Oral BID AC & HS  . loratadine  10 mg Oral Daily  . metoCLOPramide  5 mg Oral TID AC & HS  . multivitamin  1 tablet Oral Daily  . omega-3 acid ethyl esters  1 g Oral Daily  . pantoprazole  40 mg Oral BID AC  . ranolazine  1,000  mg Oral BID  . sevelamer carbonate  800 mg Oral TID WC  . vitamin B-12  1,000 mcg Oral Daily   acetaminophen **OR** acetaminophen, albuterol, bisacodyl, bisacodyl, HYDROmorphone (DILAUDID) injection, iopamidol, nitroGLYCERIN, polyethylene glycol, promethazine, sodium phosphate  Assessment/ Plan:  [name] is a 55 y.o. black female with insulin dependent diabetes, hypertension, hyperlipidemia, diabetic retinopathy, diabetic neuropathy, diabetic gastroparesis, pancreatic insufficiency, depression, GERD. Renal masses - not a candidate for epo   MWF [facility] [place].   1. End Stage Renal Disease: no acute indication for dialysis.  - Continue MWF schedule. Treatment for tomorrow.   2. Hypertension: blood pressure at goal. Restarted PO medications today.   3. Anemia of chronic kidney disease:  hemoglobin 10. Not getting erythropoeitin stimulating agent as outpatient due to renal masses. However CT from 6/24 does not seem to show any renal masses.   4. Secondary Hyperparathyroidism: PTH 284 as outpatient. Phos 5.  - Continue sevelamer    LOS: 1 Churchill Grimsley 6/25/201710:09 AM

## 2016-03-16 NOTE — Progress Notes (Signed)
Lab called to notify pts stool is positive for C-diff. MD Posey Pronto paged and notified. Orders received for Vancomycin PO Q8. Order placed.

## 2016-03-16 NOTE — Progress Notes (Signed)
[facility]                                                                                                                                                                                            Patient Demographics   [name], is a 55 y.o. female, DOB - Jan 31, 1961, [id number]  Admit date - 03/15/2016   Admitting Physician Bettey Costa, MD  Outpatient Primary MD for the patient is Burns, Ala Dach, MD   LOS - 1  Subjective: Patient known to me she was recently hospitalized and discharged last week she was thought to have  Extra-pyramidal side effects due to metoclopramide.  She missed her dialysis admitted with abdominal pain nausea vomiting. Now noted to have hypoglycemia. She complains of pain in her legs   Review of Systems:   CONSTITUTIONAL: No documented fever. Positiveatigue,  positive weakness. No weight gain, no weight loss.  EYES: No blurry or double vision.  ENT: No tinnitus. No postnasal drip. No redness of the oropharynx.  RESPIRATORY: No cough, no wheeze, no hemoptysis. No dyspnea.  CARDIOVASCULAR: No chest pain. No orthopnea. No palpitations. No syncope.  GASTR: positive nausea, no vomiting Positive diarrhea. No abdominal pain. No melena or hematochezia.  GENITOURINARY: No dysuria or hematuria.  ENDOCRINE: No polyuria or nocturia. No heat or cold intolerance.  HEMATOLOGY: No anemia. No bruising. No bleeding.  INTEGUMENTARY: No rashes. No lesions.  MUSCULOSKELETAL: No arthritis. No swelling. No gout.  Leg pain NEUROLOGIC: No numbness, tingling, or ataxia. No seizure-type activity.  PSYCHIATRIC: No anxiety. No insomnia. No ADD.    Vitals:   Filed Vitals:   03/15/16 1618 03/15/16 1945 03/16/16 0420 03/16/16 0742  BP: 144/58 142/48 130/46 142/78  Pulse: 67 68 70 72  Temp: 96.8 F (36 C) 97.2 F (36.2 C) 97.1 F (36.2 C) 97 F (36.1 C)  TempSrc: Oral Oral Oral Oral  Resp: 20 30 18 20   Height:      Weight:       SpO2: 98% 99% 91% 96%    Wt Readings from Last 3 Encounters:  03/15/16 75.098 kg (165 lb 9 oz)  03/14/16 70.1 kg (154 lb 8.7 oz)  02/16/16 68.221 kg (150 lb 6.4 oz)     Intake/Output Summary (Last 24 hours) at 03/16/16 1208 Last data filed at 03/15/16 2008  Gross per 24 hour  Intake      0 ml  Output      0 ml  Net      0 ml    Physical Exam:   GENERAL  : Anxious appearing HEAD, EYES,  EARS, NOSE AND THROAT: Atraumatic, normocephalic. Extraocular muscles are intact. Pupils equal and reactive to light. Sclerae anicteric. No conjunctival injection. No oro-pharyngeal erythema.  NECK: Supple. There is no jugular venous distention. No bruits, no lymphadenopathy, no thyromegaly.  HEART: Regular rate and rhythm,. No murmurs, no rubs, no clicks.  LUNGS: Clear to auscultation bilaterally. No rales or rhonchi. No wheezes.  ABDOMEN: Soft, flat, nontender, nondistended. Has good bowel sounds. No hepatosplenomegaly appreciated.  EXTREMITIES: No evidence of any cyanosis, clubbing, or peripheral edema.  +2 pedal and radial pulses bilaterally.  NEUROLOGIC: The patient is alert, awake, and oriented x3 with no focal motor or sensory deficits appreciated bilaterally.  SKIN: Moist and warm with no rashes appreciated.  Psych: Not anxious, depressed LN: No inguinal LN enlargement    Antibiotics   Anti-infectives    None      Medications   Scheduled Meds: . acidophilus  1 capsule Oral Daily  . amLODipine  10 mg Oral Daily  . aspirin EC  81 mg Oral BH-q7a  . atorvastatin  20 mg Oral QPM  . cholecalciferol  1,000 Units Oral Daily  . citalopram  20 mg Oral BH-q7a  . clopidogrel  75 mg Oral Daily  . docusate sodium  100 mg Oral BID  . haloperidol  1 mg Oral BID  . heparin  5,000 Units Subcutaneous Q8H  . hydrALAZINE  25 mg Oral TID  . insulin aspart  0-5 Units Subcutaneous QHS  . insulin aspart  0-9 Units Subcutaneous TID WC  . lipase/protease/amylase  12,000 Units Oral With snacks   . lipase/protease/amylase  24,000 Units Oral Q lunch  . lipase/protease/amylase  36,000 Units Oral BID AC & HS  . loratadine  10 mg Oral Daily  . metoCLOPramide  5 mg Oral TID AC & HS  . multivitamin  1 tablet Oral Daily  . omega-3 acid ethyl esters  1 g Oral Daily  . pantoprazole  40 mg Oral BID AC  . ranolazine  1,000 mg Oral BID  . sevelamer carbonate  800 mg Oral TID WC  . vitamin B-12  1,000 mcg Oral Daily   Continuous Infusions: . dextrose 5 % and 0.45% NaCl 50 mL/hr at 03/16/16 1127   PRN Meds:.acetaminophen **OR** acetaminophen, albuterol, bisacodyl, bisacodyl, HYDROmorphone (DILAUDID) injection, iopamidol, nitroGLYCERIN, polyethylene glycol, promethazine, sodium phosphate   Data Review:   Micro Results No results found for this or any previous visit (from the past 240 hour(s)).  Radiology Reports Ct Abdomen Pelvis Wo Contrast  03/15/2016  CLINICAL DATA:  Right abdominal pain. EXAM: CT ABDOMEN AND PELVIS WITHOUT CONTRAST TECHNIQUE: Multidetector CT imaging of the abdomen and pelvis was performed following the standard protocol without IV contrast. COMPARISON:  02/16/2016 CT abdomen/ pelvis. FINDINGS: Lower chest: Bilateral loculated pleural effusions at the lung bases, small and stable on the right and moderate and increased on the left. Stable chronic mild pleural thickening on the right. Compressive atelectasis at both lung bases, left greater than right. Ground-glass attenuation in both lung bases may represent pulmonary edema. Small pericardial effusion/ thickening, not appreciably changed. Right coronary atherosclerosis. Hepatobiliary: Normal liver with no liver mass. Cholecystectomy . Bile ducts are stable and within expected post cholecystectomy limits, with common bile duct diameter 6 mm. No radiopaque choledocholithiasis. The Pancreas: Mild haziness of the peripancreatic fat, unchanged. No peripancreatic fluid collections. No pancreatic mass or duct dilation. Spleen: Normal  size. No mass. Adrenals/Urinary Tract: Normal adrenals. Symmetric prominent bilateral renal atrophy. No hydronephrosis. No contour deforming  renal mass. Normal bladder. Stomach/Bowel: Grossly normal stomach. Normal caliber small bowel with no small bowel wall thickening. Appendectomy. Collapsed large bowel. Mild sigmoid diverticulosis. No definite large bowel wall thickening. Vascular/Lymphatic: Atherosclerotic nonaneurysmal abdominal aorta. No pathologically enlarged lymph nodes in the abdomen or pelvis. Reproductive: Status post hysterectomy, with no abnormal findings at the vaginal cuff. No adnexal mass. Other: No pneumoperitoneum, ascites or focal fluid collection. Mild to moderate anasarca, unchanged. Foci of subcutaneous soft tissue attenuation in the right ventral abdominal wall are nonspecific, probably due to subcutaneous injection of medications. Musculoskeletal: No aggressive appearing focal osseous lesions. Partial ankylosis of the disc space at T11-12, unchanged. IMPRESSION: 1. Loculated pleural effusions and atelectasis at both lung bases, stable and small on the right, and increased and small to moderate on the left. 2. Stable small pericardial effusion/thickening, partially visualized. 3. Nonspecific haziness of the peripancreatic fat, unchanged from 02/16/2016, probably due to noninflammatory edema given the chronicity and presence of anasarca, although correlation with lipase is recommended to exclude acute pancreatitis. No peripancreatic fluid collections. 4. No evidence of bowel obstruction or acute bowel inflammation. Mild sigmoid diverticulosis with no evidence of acute diverticulitis. 5. Aortic atherosclerosis.  Coronary atherosclerosis. Electronically Signed   By: Ilona Sorrel M.D.   On: 03/15/2016 16:04   Dg Thoracic Spine 2 View  02/17/2016  CLINICAL DATA:  Upper back pain.  Chest pain. EXAM: THORACIC SPINE 2 VIEWS COMPARISON:  02/16/2016 and 11/21/2015 FINDINGS: Stent in the left  subclavian graft. Indistinct interstitial opacity in the lungs with bilateral pleural effusions and thickening of the fissures. Interbody and interspinous fusion at T11-12. No thoracic spine fracture identified. No subluxation observed. Atherosclerotic calcifications in the upper abdomen. IMPRESSION: 1. Suspected pulmonary edema and small bilateral pleural effusions. 2. Interbody and interspinous fusion at T11-12. Electronically Signed   By: Van Clines M.D.   On: 02/17/2016 12:30   Ct Chest Wo Contrast  02/17/2016  CLINICAL DATA:  55 year old female status post thoracentesis. Bilateral pleural effusions. EXAM: CT CHEST WITHOUT CONTRAST TECHNIQUE: Multidetector CT imaging of the chest was performed following the standard protocol without IV contrast. COMPARISON:  Chest CT 11/21/2015. FINDINGS: Mediastinum/Lymph Nodes: Heart size is normal. There is no significant pericardial fluid, thickening or pericardial calcification. There is atherosclerosis of the thoracic aorta, the great vessels of the mediastinum and the coronary arteries, including calcified atherosclerotic plaque in the left main, left anterior descending, left circumflex and right coronary arteries. Multiple prominent borderline enlarged mediastinal and hilar lymph nodes. Mildly enlarged prevascular lymph node measuring up to 11 mm in short axis. Esophagus is unremarkable in appearance. No axillary lymphadenopathy. Vascular stent in the left axillary and subclavian vein. Lungs/Pleura: Mild diffuse interlobular septal thickening and a few patchy areas of very mild ground-glass attenuation, suggesting a background of mild pulmonary edema. No confluent consolidative airspace disease. Small bilateral pleural effusions, which appear at least partially loculated, particularly in the left hemithorax. No pneumothorax. Areas of mild scarring and/or atelectasis throughout the periphery of the lung bases bilaterally. Upper Abdomen: Unremarkable.  Musculoskeletal/Soft Tissues: There are no aggressive appearing lytic or blastic lesions noted in the visualized portions of the skeleton. IMPRESSION: 1. The appearance of the chest suggests mild congestive heart failure, as above. 2. Associated with this there are small bilateral pleural effusions, which appear at least partially loculated particularly on the left side. 3. No pneumothorax following thoracentesis. 4. Multiple borderline enlarged and minimally enlarged mediastinal and bilateral hilar lymph nodes, nonspecific, but likely reactive. 5. Atherosclerosis, including  left main and 3 vessel coronary artery disease. Please note that although the presence of coronary artery calcium documents the presence of coronary artery disease, the severity of this disease and any potential stenosis cannot be assessed on this non-gated CT examination. Assessment for potential risk factor modification, dietary therapy or pharmacologic therapy may be warranted, if clinically indicated. 6. Additional incidental findings, as above. Electronically Signed   By: Vinnie Langton M.D.   On: 02/17/2016 15:50   Dg Chest Bilateral Decubitus  02/16/2016  CLINICAL DATA:  Patient with history of pleural effusion. EXAM: CHEST - BILATERAL DECUBITUS VIEW COMPARISON:  Chest radiograph earlier same day. FINDINGS: Bilateral decubitus images were obtained demonstrating small layering bilateral pleural effusions. Cardiomegaly. Diffuse bilateral perihilar interstitial pulmonary opacities compatible with edema. Stent graft material overlying the left axilla. No pneumothorax. IMPRESSION: Small layering bilateral pleural effusions. Cardiomegaly and interstitial pulmonary edema. Electronically Signed   By: Lovey Newcomer M.D.   On: 02/16/2016 16:37   Ct Abdomen Pelvis W Contrast  02/16/2016  CLINICAL DATA:  Abdominal pain.  Increasing abdominal to the touch. EXAM: CT ABDOMEN AND PELVIS WITH CONTRAST TECHNIQUE: Multidetector CT imaging of the  abdomen and pelvis was performed using the standard protocol following bolus administration of intravenous contrast. CONTRAST:  117mL ISOVUE-300 IOPAMIDOL (ISOVUE-300) INJECTION 61% COMPARISON:  11/12/2015 FINDINGS: Lower chest: Loculated bilateral small pleural effusions, right greater than left. Normal heart size. Hepatobiliary: Normal liver.  Prior cholecystectomy. Pancreas: Normal. Spleen: Normal. Adrenals/Urinary Tract: Normal adrenal glands. Bilateral atrophic kidneys. No urolithiasis or obstructive uropathy. Normal bladder. Stomach/Bowel: No bowel wall thickening or bowel dilatation. No pneumatosis, pneumoperitoneum or portal venous gas. Moderate amount of stool throughout the colon. No abdominal or pelvic free fluid. Vascular/Lymphatic: Normal caliber abdominal aorta. Abdominal aortic atherosclerosis. Diffuse atherosclerosis of the superior mesenteric artery inferior mesenteric artery. No lymphadenopathy. Reproductive: Prior hysterectomy.  No adnexal mass. Other: No fluid collection or hematoma.  Diffuse anasarca. Musculoskeletal: No acute osseous abnormality. No lytic or sclerotic osseous lesion. IMPRESSION: 1. Diffuse anasarca. 2. Loculated bilateral small pleural effusions, right greater than left. 3. No bowel obstruction. 4. Previous demonstrated small enhancing right renal masses are not appreciated on the current examination and may be secondary to the phase of imaging. Consider nonemergent abdomen MRI without contrast for further evaluation. Electronically Signed   By: Kathreen Devoid   On: 02/16/2016 13:48   Dg Chest Portable 1 View  02/16/2016  CLINICAL DATA:  Shortness of breath. EXAM: PORTABLE CHEST 1 VIEW COMPARISON:  December 20, 2015. FINDINGS: Stable cardiomediastinal silhouette. No pneumothorax is noted. Mild bibasilar interstitial densities are noted with mild associated pleural effusions. Bony thorax is unremarkable. IMPRESSION: Mild bibasilar opacities are noted consistent with edema or  atelectasis with associated pleural effusions, right greater than left. Electronically Signed   By: Marijo Conception, M.D.   On: 02/16/2016 08:51   US Thoracentesis Asp Pleural Space W/img Guide  02/17/2016  CLINICAL DATA:  Chest pain, loculated pleural effusion EXAM: EXAM THORACENTESIS WITH ULTRASOUND TECHNIQUE: The procedure, risks (including but not limited to bleeding, infection, organ damage ), benefits, and alternatives were explained to the patient. Questions regarding the procedure were encouraged and answered. The patient understands and consents to the procedure. Survey ultrasound of the right hemithorax was performed and an appropriate skin entry site was localized. Site was marked, prepped with chlorhexidine, draped in usual sterile fashion, infiltrated locally with 1% lidocaine. The Saf-T-Centesis needle was advanced into the pleural space. Cloudy fluid returned. 300 mL was  removed. Samples sent for requested studies. The patient tolerated procedure well. COMPLICATIONS: COMPLICATIONS None immediate IMPRESSION: Technically successful ultrasound-guided right thoracentesis. Follow-up chest CT pending. Electronically Signed   By: Lucrezia Europe M.D.   On: 02/17/2016 17:02     CBC  Recent Labs Lab 03/14/16 0810 03/15/16 0742 03/16/16 0404  WBC 6.1 5.2 5.1  HGB 9.9* 9.9* 10.0*  HCT 31.0* 31.0* 31.5*  PLT 202 192 194  MCV 77.4* 78.9* 78.3*  MCH 24.8* 25.2* 25.0*  MCHC 32.0 32.0 31.9*  RDW 21.3* 21.3* 21.4*  LYMPHSABS 1.8  --   --   MONOABS 0.5  --   --   EOSABS 1.1*  --   --   BASOSABS 0.1  --   --     Chemistries   Recent Labs Lab 03/14/16 0810 03/15/16 0742  NA 133* 136  K 3.8 3.9  CL 94* 97*  CO2 30 29  GLUCOSE 80 124*  BUN 20 26*  CREATININE 4.46* 5.63*  CALCIUM 8.5* 8.4*  AST 22 27  ALT 14 15  ALKPHOS 104 106  BILITOT 0.8 0.9   ------------------------------------------------------------------------------------------------------------------ estimated creatinine  clearance is 11.3 mL/min (by C-G formula based on Cr of 5.63). ------------------------------------------------------------------------------------------------------------------  Recent Labs  03/15/16 0742  HGBA1C 4.6   ------------------------------------------------------------------------------------------------------------------ No results for input(s): CHOL, HDL, LDLCALC, TRIG, CHOLHDL, LDLDIRECT in the last 72 hours. ------------------------------------------------------------------------------------------------------------------  Recent Labs  03/15/16 0742  TSH 1.280   ------------------------------------------------------------------------------------------------------------------  Recent Labs  03/15/16 0742  VITAMINB12 1362*    Coagulation profile No results for input(s): INR, PROTIME in the last 168 hours.  No results for input(s): DDIMER in the last 72 hours.  Cardiac Enzymes  Recent Labs Lab 03/14/16 0810  TROPONINI <0.03   ------------------------------------------------------------------------------------------------------------------ Invalid input(s): POCBNP    Assessment & Plan   56 year old female with ESRD on hemodialysis, ASCVD and chronic pancreatitis who presents with nausea, vomiting, abdominal pain and dark colored stools.  1. Abdominal pain:  CT scan does show chronic pancreatic inflammation Lipase is normal  GI consult is pending   2. Nausea with vomiting and diarrhea: recent C. difficile was negative Continue supportive care    3. Diet-controlled diabeteBlood glucose low  We'll place her on D5 containing IV fluids   4. ESRD on hemodialysis: nephrology consult done HD per them  5. Dark-colored stools: Hemoglobin remained stable since yesterday.continue PPIs hemoglobin is stable   6. Depression: Appreciate psych input  Continue Celexa  7. ASCVD: Continue Norvasc, atorvastatin, aspirin  Continue aspirin resume with.  8.  History of chronic pancreatitis: Continue Creon      Code Status Orders        Start     Ordered   03/15/16 1323  Full code   Continuous     03/15/16 1322    Code Status History    Date Active Date Inactive Code Status Order ID Comments User Context   02/16/2016  2:09 PM 02/19/2016 10:38 PM Full Code [id number]  Bettey Costa, MD ED   12/17/2015 11:39 AM 12/21/2015 10:09 PM Full Code [id number]  Dustin Flock, MD ED   11/12/2015  4:02 PM 12/01/2015  7:43 PM Full Code [id number]  Bettey Costa, MD Inpatient   10/07/2015  9:37 AM 10/12/2015  7:59 PM Full Code [id number]  Fritzi Mandes, MD ED   10/03/2015  3:55 AM 10/05/2015 10:30 PM Full Code [id number]  Lance Coon, MD Inpatient   08/31/2015  4:00 PM 09/03/2015 10:09 PM Full Code [id number]  Demetrios Loll,  MD ED   07/26/2015  3:28 PM 07/26/2015  9:36 PM Full Code [id number]  Dionisio David, MD Inpatient   07/22/2015  5:36 PM 07/26/2015  3:28 PM Full Code [id number]  Vaughan Basta, MD Inpatient   06/05/2015  8:39 PM 06/07/2015  8:03 PM Full Code [id number]  Bettey Costa, MD Inpatient   05/21/2015  9:42 PM 05/24/2015  4:26 PM Full Code [id number]  Henreitta Leber, MD Inpatient   04/24/2015  8:55 AM 04/26/2015 10:11 PM Full Code [id number]  Demetrios Loll, MD Inpatient   04/01/2015  3:58 PM 04/05/2015  8:07 PM Full Code [id number]  Epifanio Lesches, MD ED   03/06/2015  8:05 AM 03/09/2015  9:11 PM Full Code [id number]  Juluis Mire, MD Inpatient   02/19/2015  4:14 PM 02/24/2015  5:41 PM Full Code [id number]  Aldean Jewett, MD Inpatient           Consults  Gi ,nephrology  DVT Prophylaxis   SCDs   Lab Results  Component Value Date   PLT 194 03/16/2016     Time Spent in minutes   75min Greater than 50% of time spent in care coordination and counseling patient regarding the condition and plan of care.   Dustin Flock M.D on 03/16/2016 at 12:08 PM  Between 7am to 6pm - Pager - [phone]  After 6pm go to [email or web address] - password [email or web address] [place]  [facility]   Office  [phone]

## 2016-03-16 NOTE — Progress Notes (Signed)
Pts BG 60, glucose gel given on recheck 68. MD Posey Pronto notified. MD placing orders (see MAR)

## 2016-03-17 DIAGNOSIS — F331 Major depressive disorder, recurrent, moderate: Secondary | ICD-10-CM

## 2016-03-17 DIAGNOSIS — R1110 Vomiting, unspecified: Secondary | ICD-10-CM

## 2016-03-17 DIAGNOSIS — R1084 Generalized abdominal pain: Secondary | ICD-10-CM

## 2016-03-17 LAB — GLUCOSE, CAPILLARY
Glucose-Capillary: 80 mg/dL (ref 65–99)
Glucose-Capillary: 85 mg/dL (ref 65–99)
Glucose-Capillary: 87 mg/dL (ref 65–99)
Glucose-Capillary: 88 mg/dL (ref 65–99)
Glucose-Capillary: 71 mg/dL (ref 65–99)
Glucose-Capillary: 84 mg/dL (ref 65–99)

## 2016-03-17 MED ORDER — VANCOMYCIN 50 MG/ML ORAL SOLUTION
125.0000 mg | Freq: Four times a day (QID) | ORAL | Status: DC
  Administered 2016-03-17 – 2016-03-19 (×7): 125 mg via ORAL
  Filled 2016-03-17 (×12): qty 2.5

## 2016-03-17 NOTE — Progress Notes (Signed)
Tx started 

## 2016-03-17 NOTE — Progress Notes (Signed)
Dialysis complete

## 2016-03-17 NOTE — Consult Note (Signed)
[facility] Consult   Reason for Consult:  Follow-up patient with history of depression and anxiety. Referring Physician:  Mody Patient Identification: [name] MRN:  [id number] Principal Diagnosis: Major depressive disorder, recurrent episode, moderate ([place]) Diagnosis:   Patient Active Problem List   Diagnosis Date Noted  . Generalized abdominal pain [R10.84]   . Uncontrollable vomiting [R11.10]   . Major depressive disorder, recurrent episode, moderate ([place]) [F33.1] 03/15/2016  . Adjustment disorder with mixed anxiety and depressed mood [F43.23] 03/15/2016  . Malnutrition of moderate degree [E44.0] 12/01/2015  . Renal mass [N28.89]   . Dyspnea [R06.00]   . Acute renal failure ([place]) [N17.9]   . Respiratory failure ([place]) [J96.90]   . High temperature [R50.9] 11/14/2015  . Pulmonary edema [J81.1]   . Encounter for central line placement [Z45.2]   . Encounter for orogastric (OG) tube placement [Z46.59]   . Nausea [R11.0] 11/12/2015  . Hyperkalemia [E87.5] 10/03/2015  . C. difficile diarrhea [A04.7] 07/22/2015  . Pneumonia [J18.9] 05/21/2015  . Hypoglycemia [E16.2] 04/24/2015  . Unresponsiveness [R40.4] 04/24/2015  . Bradycardia [R00.1] 04/24/2015  . Hypothermia [T68.XXXA] 04/24/2015  . Acute respiratory failure ([place]) [J96.00] 04/24/2015  . Acute diastolic CHF (congestive heart failure) ([place]) [I50.31] 04/05/2015  . Diabetic gastroparesis ([place]) [E11.43, K31.84] 04/05/2015  . Hypokalemia [E87.6] 04/05/2015  . Generalized weakness [R53.1] 04/05/2015  . Acute pulmonary edema ([place]) [J81.0] 04/03/2015  . Nausea and vomiting [R11.2] 04/03/2015  . Hypoglycemia associated with diabetes ([place]) [E11.649] 04/03/2015  . Anemia of chronic disease [D63.8] 04/03/2015  . Secondary hyperparathyroidism ([place]) [N25.81] 04/03/2015  . Pressure ulcer [L89.90] 04/02/2015  . Acute respiratory failure with hypoxia ([place]) [J96.01] 04/01/2015  . Adjustment disorder with anxiety [F43.22]  03/14/2015  . Somatic symptom disorder, mild [F45.8] 03/08/2015  . Coronary artery disease involving native coronary artery of native heart without angina pectoris [I25.10]   . Nausea & vomiting [R11.2] 03/06/2015  . Abdominal pain [R10.9] 03/06/2015  . DM (diabetes mellitus) ([place]) [E11.9] 03/06/2015  . HTN (hypertension) [I10] 03/06/2015  . Gastroparesis [K31.84] 02/24/2015  . Pleural effusion [J90] 02/19/2015  . HCAP (healthcare-associated pneumonia) [J18.9] 02/19/2015  . End-stage renal disease on hemodialysis ([place]) [N18.6, Z99.2] 02/19/2015    Total Time spent with patient: 20 minutes  Subjective:   [name] is a 55 y.o. female patient admitted with "I had been sick".  HPI:  Follow-up note for Monday the 26th. See Dr. Lucy Antigua notes from the weekend. Patient who tends to get much more emotional when she is physically ill. She also tells me that she's been feeling down recently because she is having to live with her son and feels more dependent which she does not like. Patient denies any suicidal ideation. Denies that she is currently having any psychotic symptoms.  Past Psychiatric History: Long history of both anxiety and depression in the past. No history of suicide attempts does well with chronic citalopram  Risk to Self: Is patient at risk for suicide?: No Risk to Others:   Prior Inpatient Therapy:   Prior Outpatient Therapy:    Past Medical History:  Past Medical History  Diagnosis Date  . Asthma   . Collagen vascular disease ([place])   . Diabetes mellitus without complication ([place])   . Hypertension   . Coronary artery disease     a. cath 2013: stenting to RCA (report not available); b. cath 2014: LM nl, pLAD 40%, mLAD nl, ost LCx 40%, mid LCx nl, pRCA 30% @ site of prior  stent, mRCA 50%  . Diabetic neuropathy ([place])   . ESRD (end stage renal disease) on dialysis ([place])     M-W-F  . GERD (gastroesophageal reflux disease)   . Hx of pancreatitis 2015  . Myocardial infarction  ([place])   . Pneumonia   . Mitral regurgitation     a. echo 10/2013: EF 62%, noWMA, mildly dilated LA, mild to mod MR/TR, GR1DD  . COPD (chronic obstructive pulmonary disease) ([place])   . chronic diastolic CHF [id number]  . dialysis 2006  . Depression   . Anxiety   . Arthritis   . Headache   . Anemia   . Renal insufficiency     Past Surgical History  Procedure Laterality Date  . Cholecystectomy    . Appendectomy    . Abdominal hysterectomy    . Dialysis fistula creation Left     upper arm  . Esophagogastroduodenoscopy N/A 03/08/2015    Procedure: ESOPHAGOGASTRODUODENOSCOPY (EGD);  Surgeon: Manya Silvas, MD;  Location: [facility] ENDOSCOPY;  Service: Endoscopy;  Laterality: N/A;  . Cardiac catheterization Left 07/26/2015    Procedure: Left Heart Cath and Coronary Angiography;  Surgeon: Dionisio David, MD;  Location: [facility] CV LAB;  Service: Cardiovascular;  Laterality: Left;  Marland Kitchen Eye surgery    . Fecal transplant N/A 08/23/2015    Procedure: FECAL TRANSPLANT;  Surgeon: Manya Silvas, MD;  Location: [facility] ENDOSCOPY;  Service: Endoscopy;  Laterality: N/A;  . Peripheral vascular catheterization N/A 12/20/2015    Procedure: Thrombectomy of dialysis access versus permcath placement;  Surgeon: Algernon Huxley, MD;  Location: [facility] LAB;  Service: Cardiovascular;  Laterality: N/A;  . Peripheral vascular catheterization N/A 12/20/2015    Procedure: A/V Shunt Intervention;  Surgeon: Algernon Huxley, MD;  Location: [facility] LAB;  Service: Cardiovascular;  Laterality: N/A;  . Peripheral vascular catheterization N/A 12/20/2015    Procedure: A/V Shuntogram/Fistulagram;  Surgeon: Algernon Huxley, MD;  Location: [facility] LAB;  Service: Cardiovascular;  Laterality: N/A;  . Peripheral vascular catheterization N/A 01/02/2016    Procedure: A/V Shuntogram/Fistulagram;  Surgeon: Algernon Huxley, MD;  Location: [facility] LAB;  Service: Cardiovascular;  Laterality: N/A;  . Peripheral vascular  catheterization N/A 01/02/2016    Procedure: A/V Shunt Intervention;  Surgeon: Algernon Huxley, MD;  Location: [facility];  Service: Cardiovascular;  Laterality: N/A;   Family History:  Family History  Problem Relation Age of Onset  . Kidney disease Mother   . Diabetes Mother    Family Psychiatric  History: Positive for anxiety Social History:  History  Alcohol Use No     History  Drug Use No    Social History   Social History  . Marital Status: Divorced    Spouse Name: N/A  . Number of Children: N/A  . Years of Education: N/A   Social History Main Topics  . Smoking status: Former Smoker -- 0.50 packs/day    Types: Cigarettes    Quit date: 02/13/2015  . Smokeless tobacco: Never Used  . Alcohol Use: No  . Drug Use: No  . Sexual Activity: Not Asked   Other Topics Concern  . None   Social History Narrative   Additional Social History:    Allergies:   Allergies  Allergen Reactions  . Compazine [Prochlorperazine Edisylate] Anaphylaxis and Nausea And Vomiting    23 Jul - patient relates that she takes promethazine frequently with no problems.  . Ace Inhibitors Swelling  .  Ativan [Lorazepam] Other (See Comments)    Reaction:  Hallucinations and headaches  . Codeine Nausea And Vomiting  . Dilaudid [Hydromorphone Hcl] Other (See Comments)    Delirium  . Gabapentin Other (See Comments)    Reaction:  Unknown   . Losartan Other (See Comments)    Reaction:  Unknown   . Ondansetron Other (See Comments)    Reaction:  Unknown   . Prochlorperazine Other (See Comments)    Reaction:  Unknown   . Scopolamine Other (See Comments)    Reaction:  Unknown   . Zofran [Ondansetron Hcl] Other (See Comments)    Reaction:  hallucinations   . Oxycodone Anxiety  . Tape Rash    Labs:  Results for orders placed or performed during the hospital encounter of 03/15/16 (from the past 48 hour(s))  Glucose, capillary     Status: Abnormal   Collection Time: 03/15/16  8:50 PM   Result Value Ref Range   Glucose-Capillary 64 (L) 65 - 99 mg/dL  Glucose, capillary     Status: Abnormal   Collection Time: 03/15/16  9:31 PM  Result Value Ref Range   Glucose-Capillary 60 (L) 65 - 99 mg/dL   Comment 1 Notify RN   Glucose, capillary     Status: Abnormal   Collection Time: 03/15/16 10:15 PM  Result Value Ref Range   Glucose-Capillary 63 (L) 65 - 99 mg/dL  Glucose, capillary     Status: None   Collection Time: 03/15/16 11:47 PM  Result Value Ref Range   Glucose-Capillary 77 65 - 99 mg/dL  CBC     Status: Abnormal   Collection Time: 03/16/16  4:04 AM  Result Value Ref Range   WBC 5.1 3.6 - 11.0 K/uL   RBC 4.02 3.80 - 5.20 MIL/uL   Hemoglobin 10.0 (L) 12.0 - 16.0 g/dL   HCT 31.5 (L) 35.0 - 47.0 %   MCV 78.3 (L) 80.0 - 100.0 fL   MCH 25.0 (L) 26.0 - 34.0 pg   MCHC 31.9 (L) 32.0 - 36.0 g/dL   RDW 21.4 (H) 11.5 - 14.5 %   Platelets 194 150 - 440 K/uL  Glucose, capillary     Status: Abnormal   Collection Time: 03/16/16  7:35 AM  Result Value Ref Range   Glucose-Capillary 60 (L) 65 - 99 mg/dL   Comment 1 Notify RN   Glucose, capillary     Status: Abnormal   Collection Time: 03/16/16  7:36 AM  Result Value Ref Range   Glucose-Capillary 60 (L) 65 - 99 mg/dL  Glucose, capillary     Status: Abnormal   Collection Time: 03/16/16  8:24 AM  Result Value Ref Range   Glucose-Capillary 61 (L) 65 - 99 mg/dL  Glucose, capillary     Status: None   Collection Time: 03/16/16 10:15 AM  Result Value Ref Range   Glucose-Capillary 68 65 - 99 mg/dL  Glucose, capillary     Status: None   Collection Time: 03/16/16 11:26 AM  Result Value Ref Range   Glucose-Capillary 78 65 - 99 mg/dL  Gastrointestinal Panel by PCR , Stool     Status: None   Collection Time: 03/16/16  2:25 PM  Result Value Ref Range   Campylobacter species NOT DETECTED NOT DETECTED   Plesimonas shigelloides NOT DETECTED NOT DETECTED   Salmonella species NOT DETECTED NOT DETECTED   Yersinia enterocolitica NOT  DETECTED NOT DETECTED   Vibrio species NOT DETECTED NOT DETECTED   Vibrio cholerae NOT DETECTED  NOT DETECTED   Enteroaggregative E coli (EAEC) NOT DETECTED NOT DETECTED   Enteropathogenic E coli (EPEC) NOT DETECTED NOT DETECTED   Enterotoxigenic E coli (ETEC) NOT DETECTED NOT DETECTED   Shiga like toxin producing E coli (STEC) NOT DETECTED NOT DETECTED   E. coli O157 NOT DETECTED NOT DETECTED   Shigella/Enteroinvasive E coli (EIEC) NOT DETECTED NOT DETECTED   Cryptosporidium NOT DETECTED NOT DETECTED   Cyclospora cayetanensis NOT DETECTED NOT DETECTED   Entamoeba histolytica NOT DETECTED NOT DETECTED   Giardia lamblia NOT DETECTED NOT DETECTED   Adenovirus F40/41 NOT DETECTED NOT DETECTED   Astrovirus NOT DETECTED NOT DETECTED   Norovirus GI/GII NOT DETECTED NOT DETECTED   Rotavirus A NOT DETECTED NOT DETECTED   Sapovirus (I, II, IV, and V) NOT DETECTED NOT DETECTED  C difficile quick scan w PCR reflex     Status: Abnormal   Collection Time: 03/16/16  2:25 PM  Result Value Ref Range   C Diff antigen POSITIVE (A) NEGATIVE   C Diff toxin NEGATIVE NEGATIVE   C Diff interpretation      Positive for toxigenic C. difficile, active toxin production not detected. Patient has toxigenic C. difficile organisms present in the bowel, but toxin was not detected. The patient may be a carrier or the level of toxin in the sample was below the limit  of detection. This information should be used in conjunction with the patient's clinical history when deciding on possible therapy.     Comment: CRITICAL RESULT CALLED TO, READ BACK BY AND VERIFIED WITH: Daleen Snook @ [id number] 03/16/16 by [facility]   Occult blood card to lab, stool     Status: None   Collection Time: 03/16/16  2:25 PM  Result Value Ref Range   Fecal Occult Bld NEGATIVE NEGATIVE  Clostridium Difficile by PCR     Status: Abnormal   Collection Time: 03/16/16  2:25 PM  Result Value Ref Range   Toxigenic C Difficile by pcr POSITIVE (A) NEGATIVE     Comment: CRITICAL RESULT CALLED TO, READ BACK BY AND VERIFIED WITH: Daleen Snook @ [id number] 03/16/16 by [place]   Glucose, capillary     Status: None   Collection Time: 03/16/16  4:14 PM  Result Value Ref Range   Glucose-Capillary 81 65 - 99 mg/dL  Glucose, capillary     Status: Abnormal   Collection Time: 03/16/16  9:01 PM  Result Value Ref Range   Glucose-Capillary 119 (H) 65 - 99 mg/dL  Glucose, capillary     Status: None   Collection Time: 03/17/16  7:51 AM  Result Value Ref Range   Glucose-Capillary 80 65 - 99 mg/dL  Glucose, capillary     Status: None   Collection Time: 03/17/16 11:29 AM  Result Value Ref Range   Glucose-Capillary 85 65 - 99 mg/dL  Glucose, capillary     Status: None   Collection Time: 03/17/16  3:59 PM  Result Value Ref Range   Glucose-Capillary 88 65 - 99 mg/dL  Glucose, capillary     Status: None   Collection Time: 03/17/16  4:23 PM  Result Value Ref Range   Glucose-Capillary 87 65 - 99 mg/dL    Current Facility-Administered Medications  Medication Dose Route Frequency Provider Last Rate Last Dose  . acetaminophen (TYLENOL) tablet 650 mg  650 mg Oral Q6H PRN Bettey Costa, MD   650 mg at 03/17/16 0415   Or  . acetaminophen (TYLENOL) suppository 650 mg  650 mg Rectal Q6H  PRN Bettey Costa, MD      . acidophilus (RISAQUAD) capsule 1 capsule  1 capsule Oral Daily Bettey Costa, MD   1 capsule at 03/16/16 1003  . albuterol (PROVENTIL) (2.5 MG/3ML) 0.083% nebulizer solution 2.5 mg  2.5 mg Nebulization Q4H PRN Sital Mody, MD      . amLODipine (NORVASC) tablet 10 mg  10 mg Oral Daily Bettey Costa, MD   10 mg at 03/17/16 1344  . aspirin EC tablet 81 mg  81 mg Oral Oretha Ellis, MD   81 mg at 03/17/16 0558  . atorvastatin (LIPITOR) tablet 20 mg  20 mg Oral QPM Bettey Costa, MD   20 mg at 03/17/16 1717  . bisacodyl (DULCOLAX) EC tablet 5 mg  5 mg Oral Daily PRN Bettey Costa, MD      . bisacodyl (DULCOLAX) EC tablet 5 mg  5 mg Oral Daily PRN Bettey Costa, MD      .  cholecalciferol (VITAMIN D) tablet 1,000 Units  1,000 Units Oral Daily Bettey Costa, MD   1,000 Units at 03/17/16 1343  . citalopram (CELEXA) tablet 20 mg  20 mg Oral Oretha Ellis, MD   20 mg at 03/17/16 1343  . clopidogrel (PLAVIX) tablet 75 mg  75 mg Oral Daily Bettey Costa, MD   75 mg at 03/17/16 1343  . dextrose 5 %-0.45 % sodium chloride infusion   Intravenous Continuous Dustin Flock, MD 50 mL/hr at 03/17/16 1341    . docusate sodium (COLACE) capsule 100 mg  100 mg Oral BID Bettey Costa, MD   100 mg at 03/15/16 2105  . haloperidol (HALDOL) tablet 1 mg  1 mg Oral BID Jolanta B Pucilowska, MD   1 mg at 03/17/16 1345  . heparin injection 5,000 Units  5,000 Units Subcutaneous Q8H Sital Mody, MD   5,000 Units at 03/17/16 1520  . hydrALAZINE (APRESOLINE) tablet 25 mg  25 mg Oral TID Bettey Costa, MD   25 mg at 03/17/16 1520  . HYDROmorphone (DILAUDID) injection 0.5 mg  0.5 mg Intravenous Q4H PRN Bettey Costa, MD   0.5 mg at 03/17/16 1519  . insulin aspart (novoLOG) injection 0-5 Units  0-5 Units Subcutaneous QHS Bettey Costa, MD   0 Units at 03/15/16 2106  . insulin aspart (novoLOG) injection 0-9 Units  0-9 Units Subcutaneous TID WC Bettey Costa, MD   0 Units at 03/15/16 1647  . iopamidol (ISOVUE-300) 61 % injection 100 mL  100 mL Intravenous Once PRN Bettey Costa, MD      . lipase/protease/amylase (CREON) capsule 12,000 Units  12,000 Units Oral With snacks Bettey Costa, MD   12,000 Units at 03/16/16 2108  . lipase/protease/amylase (CREON) capsule 24,000 Units  24,000 Units Oral Q lunch Bettey Costa, MD   24,000 Units at 03/17/16 1529  . lipase/protease/amylase (CREON) capsule 36,000 Units  36,000 Units Oral BID AC & HS Bettey Costa, MD   36,000 Units at 03/16/16 2109  . loratadine (CLARITIN) tablet 10 mg  10 mg Oral Daily Bettey Costa, MD   10 mg at 03/17/16 1343  . metoCLOPramide (REGLAN) tablet 5 mg  5 mg Oral TID AC & HS Lavonia Dana, MD   5 mg at 03/17/16 1717  . multivitamin (RENA-VIT) tablet 1 tablet  1  tablet Oral Daily Bettey Costa, MD   1 tablet at 03/17/16 1344  . nitroGLYCERIN (NITROSTAT) SL tablet 0.4 mg  0.4 mg Sublingual Q5 Min x 3 PRN Bettey Costa, MD      .  omega-3 acid ethyl esters (LOVAZA) capsule 1 g  1 g Oral Daily Bettey Costa, MD   1 g at 03/17/16 1343  . pantoprazole (PROTONIX) EC tablet 40 mg  40 mg Oral BID AC Bettey Costa, MD   40 mg at 03/17/16 1717  . polyethylene glycol (MIRALAX / GLYCOLAX) packet 17 g  17 g Oral Daily PRN Bettey Costa, MD      . promethazine (PHENERGAN) injection 25 mg  25 mg Intravenous Q6H PRN Harrie Foreman, MD   25 mg at 03/17/16 0814  . ranolazine (RANEXA) 12 hr tablet 1,000 mg  1,000 mg Oral BID Bettey Costa, MD   1,000 mg at 03/17/16 1345  . sevelamer carbonate (RENVELA) tablet 800 mg  800 mg Oral TID WC Sital Mody, MD   800 mg at 03/17/16 1600  . sodium phosphate (FLEET) 7-19 GM/118ML enema 1 enema  1 enema Rectal Once PRN Bettey Costa, MD      . vancomycin (VANCOCIN) 50 mg/mL oral solution 125 mg  125 mg Oral Q6H Epifanio Lesches, MD   125 mg at 03/17/16 1604  . vitamin B-12 (CYANOCOBALAMIN) tablet 1,000 mcg  1,000 mcg Oral Daily Bettey Costa, MD   1,000 mcg at 03/15/16 1406    Musculoskeletal: Strength & Muscle Tone: decreased Gait & Station: unsteady Patient leans: N/A  Psychiatric Specialty Exam: Physical Exam  ROS  Blood pressure 148/55, pulse 72, temperature 97.6 F (36.4 C), temperature source Oral, resp. rate 18, height 5\' 5"  (1.651 m), weight 75.4 kg (166 lb 3.6 oz), SpO2 97 %.Body mass index is 27.66 kg/(m^2).  General Appearance: Disheveled  Eye Contact:  Good  Speech:  Slow  Volume:  Decreased  Mood:  Dysphoric  Affect:  Constricted  Thought Process:  Goal Directed  Orientation:  Full (Time, Place, and Person)  Thought Content:  Logical  Suicidal Thoughts:  No  Homicidal Thoughts:  No  Memory:  Immediate;   Fair Recent;   Fair Remote;   Fair  Judgement:  Fair  Insight:  Fair  Psychomotor Activity:  Decreased  Concentration:   Concentration: Fair  Recall:  [employer] of Knowledge:  Fair  Language:  Fair  Akathisia:  No  Handed:  Right  AIMS (if indicated):     Assets:  Communication Skills Desire for Improvement Housing Resilience  ADL's:  Intact  Cognition:  WNL  Sleep:        Treatment Plan Summary: Plan Patient appears to be continuing to improve. Certainly not any worse. No worsening of depression. No suicidal ideation. Not psychotic. Tolerates medicine well. Does well with supportive therapy. No changed any other treatment right now. I will follow-up as needed.  Disposition: Patient does not meet criteria for psychiatric inpatient admission. Supportive therapy provided about ongoing stressors.  Alethia Berthold, MD 03/17/2016 8:02 PM  [name] denies symptoms of depression or anxiety. She is not suicidal or homicidal. She complains mostly of physical discomfort in the abdomen nausea and vomiting. She refused to eat lunch. Yesterday she told me about auditory hallucinations, hearing songs in her head all the time, and visual hallucinations, seeing crowds of people with a corner of her eye. And that this happens only when she is physically sick. She was started on Haldol 1 mg twice daily. Today she did not see any crowds by would see her daughter and granddaughter even though they were not there. She still hears the music.  PLAN: 1. The patient does not meet criteria for  psychiatric hospitalization. Discharge is appropriate.  2. We'll continue Celexa as prescribed by her primary provider and Haldol for now.  3. Dr. Shelbie Ammons will follow along on Monday.

## 2016-03-17 NOTE — Care Management (Signed)
[facility] does not follow for [place] (high risk). Plan is to stay with [name] liaison informed.

## 2016-03-17 NOTE — Progress Notes (Signed)
Pre Dialysis 

## 2016-03-17 NOTE — Progress Notes (Signed)
Attempted to order early tray for pt due to dialysis. NA at this time. Primary nurse Lucretia Field to follow up

## 2016-03-17 NOTE — Progress Notes (Signed)
[facility]  ROUNDING NOTE   Subjective:   Admitted for nausea/vomiting/abdominal pain. Was taken off metoclopramide last week due to extra-pyramidal side effects.  Patient missed dialysis on Friday.  Patient seen during dialysis Tolerating well    HEMODIALYSIS FLOWSHEET:  Blood Flow Rate (mL/min): 400 mL/min Arterial Pressure (mmHg): -160 mmHg Venous Pressure (mmHg): 170 mmHg Transmembrane Pressure (mmHg): 20 mmHg Ultrafiltration Rate (mL/min): 500 mL/min Dialysate Flow Rate (mL/min): 600 ml/min Conductivity: Machine : 13.9 Conductivity: Machine : 13.9 Dialysis Fluid Bolus: Normal Saline Bolus Amount (mL): 250 mL Dialysate Change:  (3K) Intra-Hemodialysis Comments: 688. Resting    Objective:  Vital signs in last 24 hours:  Temp:  [96.7 F (35.9 C)-97.5 F (36.4 C)] 97.5 F (36.4 C) (06/26 0929) Pulse Rate:  [68-79] 69 (06/26 1100) Resp:  [16-20] 18 (06/26 1100) BP: (139-151)/(52-70) 141/61 mmHg (06/26 1100) SpO2:  [97 %-98 %] 98 % (06/26 0929) Weight:  [71.986 kg (158 lb 11.2 oz)-76.5 kg (168 lb 10.4 oz)] 76.5 kg (168 lb 10.4 oz) (06/26 0929)  Weight change: -3.112 kg (-6 lb 13.8 oz) Filed Weights   03/15/16 0714 03/17/16 0351 03/17/16 0929  Weight: 75.098 kg (165 lb 9 oz) 71.986 kg (158 lb 11.2 oz) 76.5 kg (168 lb 10.4 oz)    Intake/Output: I/O last 3 completed shifts: In: 1393.3 [P.O.:500; I.V.:893.3] Out: -    Intake/Output this shift:     Physical Exam: General: NAD, laying in bed  Head: Normocephalic, atraumatic. Moist oral mucosal membranes  Eyes: Anicteric,  Neck: Supple, trachea midline  Lungs:  Normal effort  Heart: Regular rate and rhythm  Abdomen:  Soft, nontender, obese  Extremities: no peripheral edema.  Neurologic: Nonfocal, moving all four extremities  Skin: No lesions  Access: Left arm AVG    Basic Metabolic Panel:  Recent Labs Lab 03/14/16 0810 03/15/16 0742  NA 133* 136  K 3.8 3.9  CL 94* 97*  CO2 30 29   GLUCOSE 80 124*  BUN 20 26*  CREATININE 4.46* 5.63*  CALCIUM 8.5* 8.4*    Liver Function Tests:  Recent Labs Lab 03/14/16 0810 03/15/16 0742  AST 22 27  ALT 14 15  ALKPHOS 104 106  BILITOT 0.8 0.9  PROT 7.9 7.8  ALBUMIN 3.1* 3.1*    Recent Labs Lab 03/14/16 0810 03/15/16 0742  LIPASE <10* 18   No results for input(s): AMMONIA in the last 168 hours.  CBC:  Recent Labs Lab 03/14/16 0810 03/15/16 0742 03/16/16 0404  WBC 6.1 5.2 5.1  NEUTROABS 2.7  --   --   HGB 9.9* 9.9* 10.0*  HCT 31.0* 31.0* 31.5*  MCV 77.4* 78.9* 78.3*  PLT 202 192 194    Cardiac Enzymes:  Recent Labs Lab 03/14/16 0810  TROPONINI <0.03    BNP: Invalid input(s): POCBNP  CBG:  Recent Labs Lab 03/16/16 1015 03/16/16 1126 03/16/16 1614 03/16/16 2101 03/17/16 0751  GLUCAP 68 78 81 119* 59    Microbiology: Results for orders placed or performed during the hospital encounter of 03/15/16  Gastrointestinal Panel by PCR , Stool     Status: None   Collection Time: 03/16/16  2:25 PM  Result Value Ref Range Status   Campylobacter species NOT DETECTED NOT DETECTED Final   Plesimonas shigelloides NOT DETECTED NOT DETECTED Final   Salmonella species NOT DETECTED NOT DETECTED Final   Yersinia enterocolitica NOT DETECTED NOT DETECTED Final   Vibrio species NOT DETECTED NOT DETECTED Final   Vibrio cholerae NOT DETECTED NOT  DETECTED Final   Enteroaggregative E coli (EAEC) NOT DETECTED NOT DETECTED Final   Enteropathogenic E coli (EPEC) NOT DETECTED NOT DETECTED Final   Enterotoxigenic E coli (ETEC) NOT DETECTED NOT DETECTED Final   Shiga like toxin producing E coli (STEC) NOT DETECTED NOT DETECTED Final   E. coli O157 NOT DETECTED NOT DETECTED Final   Shigella/Enteroinvasive E coli (EIEC) NOT DETECTED NOT DETECTED Final   Cryptosporidium NOT DETECTED NOT DETECTED Final   Cyclospora cayetanensis NOT DETECTED NOT DETECTED Final   Entamoeba histolytica NOT DETECTED NOT DETECTED Final    Giardia lamblia NOT DETECTED NOT DETECTED Final   Adenovirus F40/41 NOT DETECTED NOT DETECTED Final   Astrovirus NOT DETECTED NOT DETECTED Final   Norovirus GI/GII NOT DETECTED NOT DETECTED Final   Rotavirus A NOT DETECTED NOT DETECTED Final   Sapovirus (I, II, IV, and V) NOT DETECTED NOT DETECTED Final  C difficile quick scan w PCR reflex     Status: Abnormal   Collection Time: 03/16/16  2:25 PM  Result Value Ref Range Status   C Diff antigen POSITIVE (A) NEGATIVE Final   C Diff toxin NEGATIVE NEGATIVE Final   C Diff interpretation   Final    Positive for toxigenic C. difficile, active toxin production not detected. Patient has toxigenic C. difficile organisms present in the bowel, but toxin was not detected. The patient may be a carrier or the level of toxin in the sample was below the limit  of detection. This information should be used in conjunction with the patient's clinical history when deciding on possible therapy.     Comment: CRITICAL RESULT CALLED TO, READ BACK BY AND VERIFIED WITH: Daleen Snook @ [id number] 03/16/16 by [facility]   Clostridium Difficile by PCR     Status: Abnormal   Collection Time: 03/16/16  2:25 PM  Result Value Ref Range Status   Toxigenic C Difficile by pcr POSITIVE (A) NEGATIVE Final    Comment: CRITICAL RESULT CALLED TO, READ BACK BY AND VERIFIED WITH: Daleen Snook @ [id number] 03/16/16 by [facility]     Coagulation Studies: No results for input(s): LABPROT, INR in the last 72 hours.  Urinalysis:  Recent Labs  03/15/16 0742  COLORURINE YELLOW*  LABSPEC 1.010  PHURINE 9.0*  GLUCOSEU 50*  HGBUR NEGATIVE  BILIRUBINUR NEGATIVE  KETONESUR NEGATIVE  PROTEINUR 100*  NITRITE NEGATIVE  LEUKOCYTESUR NEGATIVE      Imaging: Ct Abdomen Pelvis Wo Contrast  03/15/2016  CLINICAL DATA:  Right abdominal pain. EXAM: CT ABDOMEN AND PELVIS WITHOUT CONTRAST TECHNIQUE: Multidetector CT imaging of the abdomen and pelvis was performed following the standard protocol without IV  contrast. COMPARISON:  02/16/2016 CT abdomen/ pelvis. FINDINGS: Lower chest: Bilateral loculated pleural effusions at the lung bases, small and stable on the right and moderate and increased on the left. Stable chronic mild pleural thickening on the right. Compressive atelectasis at both lung bases, left greater than right. Ground-glass attenuation in both lung bases may represent pulmonary edema. Small pericardial effusion/ thickening, not appreciably changed. Right coronary atherosclerosis. Hepatobiliary: Normal liver with no liver mass. Cholecystectomy . Bile ducts are stable and within expected post cholecystectomy limits, with common bile duct diameter 6 mm. No radiopaque choledocholithiasis. The Pancreas: Mild haziness of the peripancreatic fat, unchanged. No peripancreatic fluid collections. No pancreatic mass or duct dilation. Spleen: Normal size. No mass. Adrenals/Urinary Tract: Normal adrenals. Symmetric prominent bilateral renal atrophy. No hydronephrosis. No contour deforming renal mass. Normal bladder. Stomach/Bowel: Grossly normal stomach. Normal caliber small  bowel with no small bowel wall thickening. Appendectomy. Collapsed large bowel. Mild sigmoid diverticulosis. No definite large bowel wall thickening. Vascular/Lymphatic: Atherosclerotic nonaneurysmal abdominal aorta. No pathologically enlarged lymph nodes in the abdomen or pelvis. Reproductive: Status post hysterectomy, with no abnormal findings at the vaginal cuff. No adnexal mass. Other: No pneumoperitoneum, ascites or focal fluid collection. Mild to moderate anasarca, unchanged. Foci of subcutaneous soft tissue attenuation in the right ventral abdominal wall are nonspecific, probably due to subcutaneous injection of medications. Musculoskeletal: No aggressive appearing focal osseous lesions. Partial ankylosis of the disc space at T11-12, unchanged. IMPRESSION: 1. Loculated pleural effusions and atelectasis at both lung bases, stable and small  on the right, and increased and small to moderate on the left. 2. Stable small pericardial effusion/thickening, partially visualized. 3. Nonspecific haziness of the peripancreatic fat, unchanged from 02/16/2016, probably due to noninflammatory edema given the chronicity and presence of anasarca, although correlation with lipase is recommended to exclude acute pancreatitis. No peripancreatic fluid collections. 4. No evidence of bowel obstruction or acute bowel inflammation. Mild sigmoid diverticulosis with no evidence of acute diverticulitis. 5. Aortic atherosclerosis.  Coronary atherosclerosis. Electronically Signed   By: Ilona Sorrel M.D.   On: 03/15/2016 16:04     Medications:   . dextrose 5 % and 0.45% NaCl 50 mL/hr at 03/16/16 1127   . acidophilus  1 capsule Oral Daily  . amLODipine  10 mg Oral Daily  . aspirin EC  81 mg Oral BH-q7a  . atorvastatin  20 mg Oral QPM  . cholecalciferol  1,000 Units Oral Daily  . citalopram  20 mg Oral BH-q7a  . clopidogrel  75 mg Oral Daily  . docusate sodium  100 mg Oral BID  . haloperidol  1 mg Oral BID  . heparin  5,000 Units Subcutaneous Q8H  . hydrALAZINE  25 mg Oral TID  . insulin aspart  0-5 Units Subcutaneous QHS  . insulin aspart  0-9 Units Subcutaneous TID WC  . lipase/protease/amylase  12,000 Units Oral With snacks  . lipase/protease/amylase  24,000 Units Oral Q lunch  . lipase/protease/amylase  36,000 Units Oral BID AC & HS  . loratadine  10 mg Oral Daily  . metoCLOPramide  5 mg Oral TID AC & HS  . multivitamin  1 tablet Oral Daily  . omega-3 acid ethyl esters  1 g Oral Daily  . pantoprazole  40 mg Oral BID AC  . ranolazine  1,000 mg Oral BID  . sevelamer carbonate  800 mg Oral TID WC  . vancomycin  500 mg Oral Q8H  . vitamin B-12  1,000 mcg Oral Daily   acetaminophen **OR** acetaminophen, albuterol, bisacodyl, bisacodyl, HYDROmorphone (DILAUDID) injection, iopamidol, nitroGLYCERIN, polyethylene glycol, promethazine, sodium  phosphate  Assessment/ Plan:  [name] is a 55 y.o. black female with insulin dependent diabetes, hypertension, hyperlipidemia, diabetic retinopathy, diabetic neuropathy, diabetic gastroparesis, pancreatic insufficiency, depression, GERD. Renal masses - avoiding epo   MWF [facility] [address]   1. End Stage Renal Disease: no acute indication for dialysis.  - Continue MWF schedule.  Patient seen during dialysis Tolerating well   2. Anemia of chronic kidney disease: hemoglobin 10.  Not getting erythropoeitin stimulating agent as outpatient due to renal masses. However CT from 6/24 does not seem to show any renal masses.   3. Secondary Hyperparathyroidism: PTH 284 as outpatient. Phos 5.5  - Continue sevelamer    LOS: 2 Marcio Hoque 6/26/201711:20 AM

## 2016-03-17 NOTE — Consult Note (Signed)
Robin Lame, MD [address] [place], [place] [address] Phone: [phone] Fax : [phone]  Consultation  Referring Provider:     No ref. provider found Primary Care Physician:  Ellamae Sia, MD Primary Gastroenterologist:  Dr. Vira Agar         Reason for Consultation:     Abdominal pain chronic pancreatitis and nausea and vomiting  Date of Admission:  03/15/2016 Date of Consultation:  03/17/2016         HPI:   [name] is a 55 y.o. female who has a history of end-stage disease and intermittent abdominal pain. The patient has had chronic pancreatitis and had a CT scan showing signs of chronic pancreatitis. The patient's lipase was normal on admission. The patient also was found to have a positive C. difficile with negative toxin. She now is having nausea and vomiting diffuse abdominal pain. The patient was also reported to have dark stools or the patient's hemoglobin has been stable and was 10 this morning. I'm now being asked to see the patient for her abdominal pain and dark stools. The patient has follow-up with Dr. Vira Agar in the past.  Past Medical History  Diagnosis Date  . Asthma   . Collagen vascular disease ([place])   . Diabetes mellitus without complication ([place])   . Hypertension   . Coronary artery disease     a. cath 2013: stenting to RCA (report not available); b. cath 2014: LM nl, pLAD 40%, mLAD nl, ost LCx 40%, mid LCx nl, pRCA 30% @ site of prior stent, mRCA 50%  . Diabetic neuropathy ([place])   . ESRD (end stage renal disease) on dialysis ([place])     M-W-F  . GERD (gastroesophageal reflux disease)   . Hx of pancreatitis 2015  . Myocardial infarction ([place])   . Pneumonia   . Mitral regurgitation     a. echo 10/2013: EF 62%, noWMA, mildly dilated LA, mild to mod MR/TR, GR1DD  . COPD (chronic obstructive pulmonary disease) ([place])   . chronic diastolic CHF [id number]  . dialysis 2006  . Depression   . Anxiety   . Arthritis   . Headache   . Anemia     . Renal insufficiency     Past Surgical History  Procedure Laterality Date  . Cholecystectomy    . Appendectomy    . Abdominal hysterectomy    . Dialysis fistula creation Left     upper arm  . Esophagogastroduodenoscopy N/A 03/08/2015    Procedure: ESOPHAGOGASTRODUODENOSCOPY (EGD);  Surgeon: Manya Silvas, MD;  Location[facility] ENDOSCOPY;  Service: Endoscopy;  Laterality: N/A;  . Cardiac catheterization Left 07/26/2015    Procedure: Left Heart Cath and Coronary Angiography;  Surgeon: Dionisio David, MD;  Location: [place] CV LAB;  Service: Cardiovascular;  Laterality: Left;  Marland Kitchen Eye surgery    . Fecal transplant N/A 08/23/2015    Procedure: FECAL TRANSPLANT;  Surgeon: Manya Silvas, MD;  Location: [facility] ENDOSCOPY;  Service: Endoscopy;  Laterality: N/A;  . Peripheral vascular catheterization N/A 12/20/2015    Procedure: Thrombectomy of dialysis access versus permcath placement;  Surgeon: Algernon Huxley, MD;  Location: [place] CV LAB;  Service: Cardiovascular;  Laterality: N/A;  . Peripheral vascular catheterization N/A 12/20/2015    Procedure: A/V Shunt Intervention;  Surgeon: Algernon Huxley, MD;  Location: [place] CV LAB;  Service: Cardiovascular;  Laterality: N/A;  . Peripheral vascular catheterization N/A 12/20/2015    Procedure: A/V Shuntogram/Fistulagram;  Surgeon: Algernon Huxley, MD;  Location: [place] CV LAB;  Service: Cardiovascular;  Laterality: N/A;  . Peripheral vascular catheterization N/A 01/02/2016    Procedure: A/V Shuntogram/Fistulagram;  Surgeon: Algernon Huxley, MD;  Location: [place] CV LAB;  Service: Cardiovascular;  Laterality: N/A;  . Peripheral vascular catheterization N/A 01/02/2016    Procedure: A/V Shunt Intervention;  Surgeon: Algernon Huxley, MD;  Location: [place] [facility] LAB;  Service: Cardiovascular;  Laterality: N/A;    Prior to Admission medications   Medication Sig Start Date End Date Taking? Authorizing Provider  acidophilus (RISAQUAD) CAPS  capsule Take 1 capsule by mouth daily.   Yes Historical Provider, MD  albuterol (PROVENTIL) (2.5 MG/3ML) 0.083% nebulizer solution Take 2.5 mg by nebulization every 4 (four) hours as needed for wheezing or shortness of breath.   Yes Historical Provider, MD  amLODipine (NORVASC) 10 MG tablet Take 10 mg by mouth daily.   Yes Historical Provider, MD  aspirin EC 81 MG tablet Take 81 mg by mouth every morning.    Yes Historical Provider, MD  atorvastatin (LIPITOR) 20 MG tablet Take 20 mg by mouth every evening.   Yes Historical Provider, MD  B COMPLEX-C-FOLIC ACID PO Take 1 tablet by mouth daily.   Yes Historical Provider, MD  bisacodyl (DULCOLAX) 5 MG EC tablet Take 1 tablet (5 mg total) by mouth daily as needed for moderate constipation. 12/01/15  Yes Vaughan Basta, MD  cetirizine (ZYRTEC) 10 MG tablet Take 10 mg by mouth every evening.    Yes Historical Provider, MD  cholecalciferol (VITAMIN D) 1000 units tablet Take 1,000 Units by mouth daily.   Yes Historical Provider, MD  citalopram (CELEXA) 20 MG tablet Take 20 mg by mouth every morning.    Yes Historical Provider, MD  clopidogrel (PLAVIX) 75 MG tablet Take 75 mg by mouth daily. Reported on 12/17/2015   Yes Historical Provider, MD  hydrALAZINE (APRESOLINE) 25 MG tablet Take 25 mg by mouth 3 (three) times daily.   Yes Historical Provider, MD  HYDROcodone-acetaminophen (NORCO/VICODIN) 5-325 MG tablet Take 1 tablet by mouth every 6 (six) hours as needed for moderate pain or severe pain.   Yes Historical Provider, MD  lipase/protease/amylase (CREON) 12000 UNITS CPEP capsule Take 12,000-36,000 Units by mouth See admin instructions. Take 3 capsules (36,000 units) by mouth every morning, Take 2 capsules (24,000 units) by mouth at lunch, Take 3 capsules (36,000 units) by mouth every night at bedtime, Then 1 capsule by mouth with snack.   Yes Historical Provider, MD  multivitamin (RENA-VIT) TABS tablet Take 1 tablet by mouth daily.   Yes Historical  Provider, MD  nitroGLYCERIN (NITROSTAT) 0.4 MG SL tablet Place 0.4 mg under the tongue every 5 (five) minutes x 3 doses as needed for chest pain. *Max 3 doses per episode*   Yes Historical Provider, MD  omega-3 acid ethyl esters (LOVAZA) 1 g capsule Take 1 g by mouth daily.   Yes Historical Provider, MD  promethazine (PHENERGAN) 25 MG tablet Take 25 mg by mouth every 6 (six) hours as needed for nausea or vomiting.   Yes Historical Provider, MD  ranitidine (ZANTAC) 150 MG tablet Take 150 mg by mouth every evening.    Yes Historical Provider, MD  ranolazine (RANEXA) 1000 MG SR tablet Take 1,000 mg by mouth 2 (two) times daily.   Yes Historical Provider, MD  sevelamer carbonate (RENVELA) 800 MG tablet Take 800 mg by mouth 3 (three) times daily with meals.  Yes Historical Provider, MD  vitamin B-12 (CYANOCOBALAMIN) 1000 MCG tablet Take 1,000 mcg by mouth daily.   Yes Historical Provider, MD  oxymetazoline (AFRIN) 0.05 % nasal spray Place 2 sprays into both nostrils 2 (two) times daily. 12/29/15   Loney Hering, MD    Family History  Problem Relation Age of Onset  . Kidney disease Mother   . Diabetes Mother      Social History  Substance Use Topics  . Smoking status: Former Smoker -- 0.50 packs/day    Types: Cigarettes    Quit date: 02/13/2015  . Smokeless tobacco: Never Used  . Alcohol Use: No    Allergies as of 03/15/2016 - Review Complete 03/15/2016  Allergen Reaction Noted  . Compazine [prochlorperazine edisylate] Anaphylaxis and Nausea And Vomiting 02/19/2015  . Ace inhibitors Swelling 02/08/2015  . Ativan [lorazepam] Other (See Comments) 04/10/2015  . Codeine Nausea And Vomiting 02/06/2015  . Dilaudid [hydromorphone hcl] Other (See Comments) 02/16/2016  . Gabapentin Other (See Comments) 05/21/2015  . Losartan Other (See Comments) 03/06/2015  . Ondansetron Other (See Comments) 08/22/2015  . Prochlorperazine Other (See Comments) 08/22/2015  . Scopolamine Other (See Comments)  05/21/2015  . Zofran [ondansetron hcl] Other (See Comments) 03/02/2015  . Oxycodone Anxiety 02/06/2015  . Tape Rash 02/08/2015    Review of Systems:    All systems reviewed and negative except where noted in HPI.   Physical Exam:  Vital signs in last 24 hours: Temp:  [96.7 F (35.9 C)-97.7 F (36.5 C)] 97.7 F (36.5 C) (06/26 1252) Pulse Rate:  [68-79] 73 (06/26 1252) Resp:  [16-20] 18 (06/26 1252) BP: (125-152)/(52-81) 125/70 mmHg (06/26 1519) SpO2:  [97 %-100 %] 100 % (06/26 1252) Weight:  [158 lb 11.2 oz (71.986 kg)-168 lb 10.4 oz (76.5 kg)] 166 lb 3.6 oz (75.4 kg) (06/26 1252) Last BM Date: 03/16/16 General:   Pleasant, cooperative in NAD Head:  Normocephalic and atraumatic. Eyes:   No icterus.   Conjunctiva pink. PERRLA. Ears:  Normal auditory acuity. Neck:  Supple; no masses or thyroidomegaly Lungs: Respirations even and unlabored. Lungs clear to auscultation bilaterally.   No wheezes, crackles, or rhonchi.  Heart:  Regular rate and rhythm;  Without murmur, clicks, rubs or gallops Abdomen:  Soft, nondistended, diffusely tender. Normal bowel sounds. No appreciable masses or hepatomegaly.  No rebound or guarding.  Rectal:  Not performed. Msk:  Symmetrical without gross deformities.  Strength  Extremities:  Without edema, cyanosis or clubbing. Neurologic:  Alert and oriented x3;  grossly normal neurologically. Skin:  Intact without significant lesions or rashes. Cervical Nodes:  No significant cervical adenopathy. Psych:  Alert and cooperative. Normal affect.  LAB RESULTS:  Recent Labs  03/15/16 0742 03/16/16 0404  WBC 5.2 5.1  HGB 9.9* 10.0*  HCT 31.0* 31.5*  PLT 192 194   BMET  Recent Labs  03/15/16 0742  NA 136  K 3.9  CL 97*  CO2 29  GLUCOSE 124*  BUN 26*  CREATININE 5.63*  CALCIUM 8.4*   LFT  Recent Labs  03/15/16 0742  PROT 7.8  ALBUMIN 3.1*  AST 27  ALT 15  ALKPHOS 106  BILITOT 0.9   PT/INR No results for input(s): LABPROT, INR in  the last 72 hours.  STUDIES: Ct Abdomen Pelvis Wo Contrast  03/15/2016  CLINICAL DATA:  Right abdominal pain. EXAM: CT ABDOMEN AND PELVIS WITHOUT CONTRAST TECHNIQUE: Multidetector CT imaging of the abdomen and pelvis was performed following the standard protocol without IV contrast. COMPARISON:  02/16/2016 CT abdomen/ pelvis. FINDINGS: Lower chest: Bilateral loculated pleural effusions at the lung bases, small and stable on the right and moderate and increased on the left. Stable chronic mild pleural thickening on the right. Compressive atelectasis at both lung bases, left greater than right. Ground-glass attenuation in both lung bases may represent pulmonary edema. Small pericardial effusion/ thickening, not appreciably changed. Right coronary atherosclerosis. Hepatobiliary: Normal liver with no liver mass. Cholecystectomy . Bile ducts are stable and within expected post cholecystectomy limits, with common bile duct diameter 6 mm. No radiopaque choledocholithiasis. The Pancreas: Mild haziness of the peripancreatic fat, unchanged. No peripancreatic fluid collections. No pancreatic mass or duct dilation. Spleen: Normal size. No mass. Adrenals/Urinary Tract: Normal adrenals. Symmetric prominent bilateral renal atrophy. No hydronephrosis. No contour deforming renal mass. Normal bladder. Stomach/Bowel: Grossly normal stomach. Normal caliber small bowel with no small bowel wall thickening. Appendectomy. Collapsed large bowel. Mild sigmoid diverticulosis. No definite large bowel wall thickening. Vascular/Lymphatic: Atherosclerotic nonaneurysmal abdominal aorta. No pathologically enlarged lymph nodes in the abdomen or pelvis. Reproductive: Status post hysterectomy, with no abnormal findings at the vaginal cuff. No adnexal mass. Other: No pneumoperitoneum, ascites or focal fluid collection. Mild to moderate anasarca, unchanged. Foci of subcutaneous soft tissue attenuation in the right ventral abdominal wall are  nonspecific, probably due to subcutaneous injection of medications. Musculoskeletal: No aggressive appearing focal osseous lesions. Partial ankylosis of the disc space at T11-12, unchanged. IMPRESSION: 1. Loculated pleural effusions and atelectasis at both lung bases, stable and small on the right, and increased and small to moderate on the left. 2. Stable small pericardial effusion/thickening, partially visualized. 3. Nonspecific haziness of the peripancreatic fat, unchanged from 02/16/2016, probably due to noninflammatory edema given the chronicity and presence of anasarca, although correlation with lipase is recommended to exclude acute pancreatitis. No peripancreatic fluid collections. 4. No evidence of bowel obstruction or acute bowel inflammation. Mild sigmoid diverticulosis with no evidence of acute diverticulitis. 5. Aortic atherosclerosis.  Coronary atherosclerosis. Electronically Signed   By: Ilona Sorrel M.D.   On: 03/15/2016 16:04      Impression / Plan:   LOVELYN ZILLES is a 55 y.o. y/o female with Dark stools and abdominal pain. The patient will be set up for an EGD for tomorrow. The patient had a very ENT today. There is no sign of anemia. The patient has been explained the plan and agrees with it.   Thank you for involving me in the care of this patient.      LOS: 2 days   Robin Lame, MD  03/17/2016, 3:34 PM   Note: This dictation was prepared with Dragon dictation along with smaller phrase technology. Any transcriptional errors that result from this process are unintentional.

## 2016-03-17 NOTE — Progress Notes (Signed)
[place] at [facility]                                                                                                                                                                                            Patient Demographics   [name], is a 55 y.o. female, DOB - 02-Jul-1961, [id number]  Admit date - 03/15/2016   Admitting Physician Bettey Costa, MD  Outpatient Primary MD for the patient is Burns, Ala Dach, MD   LOS - 2  Subjective: Seen at bedside.havign nausea,received phenergan ,eating breakfast.hypoglycemia resolving.no abdominal pain.going for HD today.   Review of Systems:   CONSTITUTIONAL: No documented fever. Positiveatigue,  positive weakness. No weight gain, no weight loss.  EYES: No blurry or double vision.  ENT: No tinnitus. No postnasal drip. No redness of the oropharynx.  RESPIRATORY: No cough, no wheeze, no hemoptysis. No dyspnea.  CARDIOVASCULAR: No chest pain. No orthopnea. No palpitations. No syncope.  GASTR: positive nausea, no vomiting Positive diarrhea. No abdominal pain. No melena or hematochezia.  GENITOURINARY: No dysuria or hematuria.  ENDOCRINE: No polyuria or nocturia. No heat or cold intolerance.  HEMATOLOGY: No anemia. No bruising. No bleeding.  INTEGUMENTARY: No rashes. No lesions.  MUSCULOSKELETAL: No arthritis. No swelling. No gout.  Leg pain NEUROLOGIC: No numbness, tingling, or ataxia. No seizure-type activity.  PSYCHIATRIC: No anxiety. No insomnia. No ADD.    Vitals:   Filed Vitals:   03/16/16 1549 03/16/16 1940 03/17/16 0351 03/17/16 0801  BP: 142/70 150/58 142/52 141/58  Pulse: 76 74 79 74  Temp: 97.1 F (36.2 C) 97.1 F (36.2 C) 96.7 F (35.9 C) 96.8 F (36 C)  TempSrc: Oral Oral Oral Oral  Resp: 18 18 18 20   Height:      Weight:   71.986 kg (158 lb 11.2 oz)   SpO2: 97% 97% 97% 98%    Wt Readings from Last 3 Encounters:  03/17/16 71.986 kg (158 lb 11.2 oz)  03/14/16 70.1 kg (154 lb  8.7 oz)  02/16/16 68.221 kg (150 lb 6.4 oz)     Intake/Output Summary (Last 24 hours) at 03/17/16 0838 Last data filed at 03/17/16 0519  Gross per 24 hour  Intake 1273.33 ml  Output      0 ml  Net 1273.33 ml    Physical Exam:   GENERAL  : Anxious appearing HEAD, EYES, EARS, NOSE AND THROAT: Atraumatic, normocephalic. Extraocular muscles are intact. Pupils equal and reactive to light. Sclerae anicteric. No conjunctival injection. No oro-pharyngeal erythema.  NECK: Supple. There is no jugular venous distention. No bruits, no lymphadenopathy, no thyromegaly.  HEART: Regular rate and rhythm,. No murmurs, no rubs, no clicks.  LUNGS: Clear to auscultation bilaterally. No rales or rhonchi. No wheezes.  ABDOMEN: Soft, flat, nontender, nondistended. Has good bowel sounds. No hepatosplenomegaly appreciated.  EXTREMITIES: No evidence of any cyanosis, clubbing, or peripheral edema.  +2 pedal and radial pulses bilaterally.  NEUROLOGIC: The patient is alert, awake, and oriented x3 with no focal motor or sensory deficits appreciated bilaterally.  SKIN: Moist and warm with no rashes appreciated.  Psych: Not anxious, depressed LN: No inguinal LN enlargement    Antibiotics   Anti-infectives    Start     Dose/Rate Route Frequency Ordered Stop   03/16/16 1730  vancomycin (VANCOCIN) 50 mg/mL oral solution 500 mg     500 mg Oral Every 8 hours 03/16/16 1714 03/30/16 1359      Medications   Scheduled Meds: . acidophilus  1 capsule Oral Daily  . amLODipine  10 mg Oral Daily  . aspirin EC  81 mg Oral BH-q7a  . atorvastatin  20 mg Oral QPM  . cholecalciferol  1,000 Units Oral Daily  . citalopram  20 mg Oral BH-q7a  . clopidogrel  75 mg Oral Daily  . docusate sodium  100 mg Oral BID  . haloperidol  1 mg Oral BID  . heparin  5,000 Units Subcutaneous Q8H  . hydrALAZINE  25 mg Oral TID  . insulin aspart  0-5 Units Subcutaneous QHS  . insulin aspart  0-9 Units Subcutaneous TID WC  .  lipase/protease/amylase  12,000 Units Oral With snacks  . lipase/protease/amylase  24,000 Units Oral Q lunch  . lipase/protease/amylase  36,000 Units Oral BID AC & HS  . loratadine  10 mg Oral Daily  . metoCLOPramide  5 mg Oral TID AC & HS  . multivitamin  1 tablet Oral Daily  . omega-3 acid ethyl esters  1 g Oral Daily  . pantoprazole  40 mg Oral BID AC  . ranolazine  1,000 mg Oral BID  . sevelamer carbonate  800 mg Oral TID WC  . vancomycin  500 mg Oral Q8H  . vitamin B-12  1,000 mcg Oral Daily   Continuous Infusions: . dextrose 5 % and 0.45% NaCl 50 mL/hr at 03/16/16 1127   PRN Meds:.acetaminophen **OR** acetaminophen, albuterol, bisacodyl, bisacodyl, HYDROmorphone (DILAUDID) injection, iopamidol, nitroGLYCERIN, polyethylene glycol, promethazine, sodium phosphate   Data Review:   Micro Results Recent Results (from the past 240 hour(s))  Gastrointestinal Panel by PCR , Stool     Status: None   Collection Time: 03/16/16  2:25 PM  Result Value Ref Range Status   Campylobacter species NOT DETECTED NOT DETECTED Final   Plesimonas shigelloides NOT DETECTED NOT DETECTED Final   Salmonella species NOT DETECTED NOT DETECTED Final   Yersinia enterocolitica NOT DETECTED NOT DETECTED Final   Vibrio species NOT DETECTED NOT DETECTED Final   Vibrio cholerae NOT DETECTED NOT DETECTED Final   Enteroaggregative E coli (EAEC) NOT DETECTED NOT DETECTED Final   Enteropathogenic E coli (EPEC) NOT DETECTED NOT DETECTED Final   Enterotoxigenic E coli (ETEC) NOT DETECTED NOT DETECTED Final   Shiga like toxin producing E coli (STEC) NOT DETECTED NOT DETECTED Final   E. coli O157 NOT DETECTED NOT DETECTED Final   Shigella/Enteroinvasive E coli (EIEC) NOT DETECTED NOT DETECTED Final   Cryptosporidium NOT DETECTED NOT DETECTED Final   Cyclospora cayetanensis NOT DETECTED NOT DETECTED Final   Entamoeba histolytica NOT DETECTED NOT DETECTED Final   Giardia lamblia NOT DETECTED  NOT DETECTED Final    Adenovirus F40/41 NOT DETECTED NOT DETECTED Final   Astrovirus NOT DETECTED NOT DETECTED Final   Norovirus GI/GII NOT DETECTED NOT DETECTED Final   Rotavirus A NOT DETECTED NOT DETECTED Final   Sapovirus (I, II, IV, and V) NOT DETECTED NOT DETECTED Final  C difficile quick scan w PCR reflex     Status: Abnormal   Collection Time: 03/16/16  2:25 PM  Result Value Ref Range Status   C Diff antigen POSITIVE (A) NEGATIVE Final   C Diff toxin NEGATIVE NEGATIVE Final   C Diff interpretation   Final    Positive for toxigenic C. difficile, active toxin production not detected. Patient has toxigenic C. difficile organisms present in the bowel, but toxin was not detected. The patient may be a carrier or the level of toxin in the sample was below the limit  of detection. This information should be used in conjunction with the patient's clinical history when deciding on possible therapy.     Comment: CRITICAL RESULT CALLED TO, READ BACK BY AND VERIFIED WITH: Daleen Snook @ [id number] 03/16/16 by [facility]   Clostridium Difficile by PCR     Status: Abnormal   Collection Time: 03/16/16  2:25 PM  Result Value Ref Range Status   Toxigenic C Difficile by pcr POSITIVE (A) NEGATIVE Final    Comment: CRITICAL RESULT CALLED TO, READ BACK BY AND VERIFIED WITH: Daleen Snook @ [id number] 03/16/16 by [facility]     Radiology Reports Ct Abdomen Pelvis Wo Contrast  03/15/2016  CLINICAL DATA:  Right abdominal pain. EXAM: CT ABDOMEN AND PELVIS WITHOUT CONTRAST TECHNIQUE: Multidetector CT imaging of the abdomen and pelvis was performed following the standard protocol without IV contrast. COMPARISON:  02/16/2016 CT abdomen/ pelvis. FINDINGS: Lower chest: Bilateral loculated pleural effusions at the lung bases, small and stable on the right and moderate and increased on the left. Stable chronic mild pleural thickening on the right. Compressive atelectasis at both lung bases, left greater than right. Ground-glass attenuation in both lung bases  may represent pulmonary edema. Small pericardial effusion/ thickening, not appreciably changed. Right coronary atherosclerosis. Hepatobiliary: Normal liver with no liver mass. Cholecystectomy . Bile ducts are stable and within expected post cholecystectomy limits, with common bile duct diameter 6 mm. No radiopaque choledocholithiasis. The Pancreas: Mild haziness of the peripancreatic fat, unchanged. No peripancreatic fluid collections. No pancreatic mass or duct dilation. Spleen: Normal size. No mass. Adrenals/Urinary Tract: Normal adrenals. Symmetric prominent bilateral renal atrophy. No hydronephrosis. No contour deforming renal mass. Normal bladder. Stomach/Bowel: Grossly normal stomach. Normal caliber small bowel with no small bowel wall thickening. Appendectomy. Collapsed large bowel. Mild sigmoid diverticulosis. No definite large bowel wall thickening. Vascular/Lymphatic: Atherosclerotic nonaneurysmal abdominal aorta. No pathologically enlarged lymph nodes in the abdomen or pelvis. Reproductive: Status post hysterectomy, with no abnormal findings at the vaginal cuff. No adnexal mass. Other: No pneumoperitoneum, ascites or focal fluid collection. Mild to moderate anasarca, unchanged. Foci of subcutaneous soft tissue attenuation in the right ventral abdominal wall are nonspecific, probably due to subcutaneous injection of medications. Musculoskeletal: No aggressive appearing focal osseous lesions. Partial ankylosis of the disc space at T11-12, unchanged. IMPRESSION: 1. Loculated pleural effusions and atelectasis at both lung bases, stable and small on the right, and increased and small to moderate on the left. 2. Stable small pericardial effusion/thickening, partially visualized. 3. Nonspecific haziness of the peripancreatic fat, unchanged from 02/16/2016, probably due to noninflammatory edema given the chronicity and presence of anasarca, although correlation  with lipase is recommended to exclude acute  pancreatitis. No peripancreatic fluid collections. 4. No evidence of bowel obstruction or acute bowel inflammation. Mild sigmoid diverticulosis with no evidence of acute diverticulitis. 5. Aortic atherosclerosis.  Coronary atherosclerosis. Electronically Signed   By: Ilona Sorrel M.D.   On: 03/15/2016 16:04   Dg Thoracic Spine 2 View  02/17/2016  CLINICAL DATA:  Upper back pain.  Chest pain. EXAM: THORACIC SPINE 2 VIEWS COMPARISON:  02/16/2016 and 11/21/2015 FINDINGS: Stent in the left subclavian graft. Indistinct interstitial opacity in the lungs with bilateral pleural effusions and thickening of the fissures. Interbody and interspinous fusion at T11-12. No thoracic spine fracture identified. No subluxation observed. Atherosclerotic calcifications in the upper abdomen. IMPRESSION: 1. Suspected pulmonary edema and small bilateral pleural effusions. 2. Interbody and interspinous fusion at T11-12. Electronically Signed   By: Van Clines M.D.   On: 02/17/2016 12:30   Ct Chest Wo Contrast  02/17/2016  CLINICAL DATA:  55 year old female status post thoracentesis. Bilateral pleural effusions. EXAM: CT CHEST WITHOUT CONTRAST TECHNIQUE: Multidetector CT imaging of the chest was performed following the standard protocol without IV contrast. COMPARISON:  Chest CT 11/21/2015. FINDINGS: Mediastinum/Lymph Nodes: Heart size is normal. There is no significant pericardial fluid, thickening or pericardial calcification. There is atherosclerosis of the thoracic aorta, the great vessels of the mediastinum and the coronary arteries, including calcified atherosclerotic plaque in the left main, left anterior descending, left circumflex and right coronary arteries. Multiple prominent borderline enlarged mediastinal and hilar lymph nodes. Mildly enlarged prevascular lymph node measuring up to 11 mm in short axis. Esophagus is unremarkable in appearance. No axillary lymphadenopathy. Vascular stent in the left axillary and  subclavian vein. Lungs/Pleura: Mild diffuse interlobular septal thickening and a few patchy areas of very mild ground-glass attenuation, suggesting a background of mild pulmonary edema. No confluent consolidative airspace disease. Small bilateral pleural effusions, which appear at least partially loculated, particularly in the left hemithorax. No pneumothorax. Areas of mild scarring and/or atelectasis throughout the periphery of the lung bases bilaterally. Upper Abdomen: Unremarkable. Musculoskeletal/Soft Tissues: There are no aggressive appearing lytic or blastic lesions noted in the visualized portions of the skeleton. IMPRESSION: 1. The appearance of the chest suggests mild congestive heart failure, as above. 2. Associated with this there are small bilateral pleural effusions, which appear at least partially loculated particularly on the left side. 3. No pneumothorax following thoracentesis. 4. Multiple borderline enlarged and minimally enlarged mediastinal and bilateral hilar lymph nodes, nonspecific, but likely reactive. 5. Atherosclerosis, including left main and 3 vessel coronary artery disease. Please note that although the presence of coronary artery calcium documents the presence of coronary artery disease, the severity of this disease and any potential stenosis cannot be assessed on this non-gated CT examination. Assessment for potential risk factor modification, dietary therapy or pharmacologic therapy may be warranted, if clinically indicated. 6. Additional incidental findings, as above. Electronically Signed   By: Vinnie Langton M.D.   On: 02/17/2016 15:50   Dg Chest Bilateral Decubitus  02/16/2016  CLINICAL DATA:  Patient with history of pleural effusion. EXAM: CHEST - BILATERAL DECUBITUS VIEW COMPARISON:  Chest radiograph earlier same day. FINDINGS: Bilateral decubitus images were obtained demonstrating small layering bilateral pleural effusions. Cardiomegaly. Diffuse bilateral perihilar  interstitial pulmonary opacities compatible with edema. Stent graft material overlying the left axilla. No pneumothorax. IMPRESSION: Small layering bilateral pleural effusions. Cardiomegaly and interstitial pulmonary edema. Electronically Signed   By: Lovey Newcomer M.D.   On: 02/16/2016 16:37  Ct Abdomen Pelvis W Contrast  02/16/2016  CLINICAL DATA:  Abdominal pain.  Increasing abdominal to the touch. EXAM: CT ABDOMEN AND PELVIS WITH CONTRAST TECHNIQUE: Multidetector CT imaging of the abdomen and pelvis was performed using the standard protocol following bolus administration of intravenous contrast. CONTRAST:  150mL ISOVUE-300 IOPAMIDOL (ISOVUE-300) INJECTION 61% COMPARISON:  11/12/2015 FINDINGS: Lower chest: Loculated bilateral small pleural effusions, right greater than left. Normal heart size. Hepatobiliary: Normal liver.  Prior cholecystectomy. Pancreas: Normal. Spleen: Normal. Adrenals/Urinary Tract: Normal adrenal glands. Bilateral atrophic kidneys. No urolithiasis or obstructive uropathy. Normal bladder. Stomach/Bowel: No bowel wall thickening or bowel dilatation. No pneumatosis, pneumoperitoneum or portal venous gas. Moderate amount of stool throughout the colon. No abdominal or pelvic free fluid. Vascular/Lymphatic: Normal caliber abdominal aorta. Abdominal aortic atherosclerosis. Diffuse atherosclerosis of the superior mesenteric artery inferior mesenteric artery. No lymphadenopathy. Reproductive: Prior hysterectomy.  No adnexal mass. Other: No fluid collection or hematoma.  Diffuse anasarca. Musculoskeletal: No acute osseous abnormality. No lytic or sclerotic osseous lesion. IMPRESSION: 1. Diffuse anasarca. 2. Loculated bilateral small pleural effusions, right greater than left. 3. No bowel obstruction. 4. Previous demonstrated small enhancing right renal masses are not appreciated on the current examination and may be secondary to the phase of imaging. Consider nonemergent abdomen MRI without contrast  for further evaluation. Electronically Signed   By: Kathreen Devoid   On: 02/16/2016 13:48   Dg Chest Portable 1 View  02/16/2016  CLINICAL DATA:  Shortness of breath. EXAM: PORTABLE CHEST 1 VIEW COMPARISON:  December 20, 2015. FINDINGS: Stable cardiomediastinal silhouette. No pneumothorax is noted. Mild bibasilar interstitial densities are noted with mild associated pleural effusions. Bony thorax is unremarkable. IMPRESSION: Mild bibasilar opacities are noted consistent with edema or atelectasis with associated pleural effusions, right greater than left. Electronically Signed   By: Marijo Conception, M.D.   On: 02/16/2016 08:51   US Thoracentesis Asp Pleural Space W/img Guide  02/17/2016  CLINICAL DATA:  Chest pain, loculated pleural effusion EXAM: EXAM THORACENTESIS WITH ULTRASOUND TECHNIQUE: The procedure, risks (including but not limited to bleeding, infection, organ damage ), benefits, and alternatives were explained to the patient. Questions regarding the procedure were encouraged and answered. The patient understands and consents to the procedure. Survey ultrasound of the right hemithorax was performed and an appropriate skin entry site was localized. Site was marked, prepped with chlorhexidine, draped in usual sterile fashion, infiltrated locally with 1% lidocaine. The Saf-T-Centesis needle was advanced into the pleural space. Cloudy fluid returned. 300 mL was removed. Samples sent for requested studies. The patient tolerated procedure well. COMPLICATIONS: COMPLICATIONS None immediate IMPRESSION: Technically successful ultrasound-guided right thoracentesis. Follow-up chest CT pending. Electronically Signed   By: Lucrezia Europe M.D.   On: 02/17/2016 17:02     CBC  Recent Labs Lab 03/14/16 0810 03/15/16 0742 03/16/16 0404  WBC 6.1 5.2 5.1  HGB 9.9* 9.9* 10.0*  HCT 31.0* 31.0* 31.5*  PLT 202 192 194  MCV 77.4* 78.9* 78.3*  MCH 24.8* 25.2* 25.0*  MCHC 32.0 32.0 31.9*  RDW 21.3* 21.3* 21.4*  LYMPHSABS  1.8  --   --   MONOABS 0.5  --   --   EOSABS 1.1*  --   --   BASOSABS 0.1  --   --     Chemistries   Recent Labs Lab 03/14/16 0810 03/15/16 0742  NA 133* 136  K 3.8 3.9  CL 94* 97*  CO2 30 29  GLUCOSE 80 124*  BUN 20 26*  CREATININE 4.46* 5.63*  CALCIUM 8.5* 8.4*  AST 22 27  ALT 14 15  ALKPHOS 104 106  BILITOT 0.8 0.9   ------------------------------------------------------------------------------------------------------------------ estimated creatinine clearance is 11.1 mL/min (by C-G formula based on Cr of 5.63). ------------------------------------------------------------------------------------------------------------------  Recent Labs  03/15/16 0742  HGBA1C 4.6   ------------------------------------------------------------------------------------------------------------------ No results for input(s): CHOL, HDL, LDLCALC, TRIG, CHOLHDL, LDLDIRECT in the last 72 hours. ------------------------------------------------------------------------------------------------------------------  Recent Labs  03/15/16 0742  TSH 1.280   ------------------------------------------------------------------------------------------------------------------  Recent Labs  03/15/16 0742  VITAMINB12 1362*    Coagulation profile No results for input(s): INR, PROTIME in the last 168 hours.  No results for input(s): DDIMER in the last 72 hours.  Cardiac Enzymes  Recent Labs Lab 03/14/16 0810  TROPONINI <0.03   ------------------------------------------------------------------------------------------------------------------ Invalid input(s): POCBNP    Assessment & Plan   55 year old female with ESRD on hemodialysis, ASCVD and chronic pancreatitis who presents with nausea, vomiting, abdominal pain and dark colored stools.  1. Abdominal pain: resolved.chronic on and off. CT scan does show chronic pancreatic inflammation Lipase is normal  GI consult is pending   2.  Nausea with vomiting and diarrhea: recent C. difficile was negative Continue supportive care    3. Diet-controlled diabetes;resolved hypoglycemia   4. ESRD on hemodialysis: nephrology consult done HD per them  5. Dark-colored stools: Hemoglobin remained stable since yesterday.continue PPIs hemoglobin is stable .seen GI before,dr.Elliott.on carafate,PPI>add Carafte  6. Depression: /hallucinations;seen by  Psych.started  On haldol by psych,also on celexa.continue that, Still has hallucinations.  7. ASCVD: Continue Norvasc, atorvastatin, aspirin  Continue aspirin resume with.  8. History of chronic pancreatitis: Continue Creon      Code Status Orders        Start     Ordered   03/15/16 1323  Full code   Continuous     03/15/16 1322    Code Status History    Date Active Date Inactive Code Status Order ID Comments User Context   02/16/2016  2:09 PM 02/19/2016 10:38 PM Full Code [id number]  Bettey Costa, MD ED   12/17/2015 11:39 AM 12/21/2015 10:09 PM Full Code [id number]  Dustin Flock, MD ED   11/12/2015  4:02 PM 12/01/2015  7:43 PM Full Code [id number]  Bettey Costa, MD Inpatient   10/07/2015  9:37 AM 10/12/2015  7:59 PM Full Code [id number]  Fritzi Mandes, MD ED   10/03/2015  3:55 AM 10/05/2015 10:30 PM Full Code [id number]  Lance Coon, MD Inpatient   08/31/2015  4:00 PM 09/03/2015 10:09 PM Full Code [id number]  Demetrios Loll, MD ED   07/26/2015  3:28 PM 07/26/2015  9:36 PM Full Code [id number]  Dionisio David, MD Inpatient   07/22/2015  5:36 PM 07/26/2015  3:28 PM Full Code [id number]  Vaughan Basta, MD Inpatient   06/05/2015  8:39 PM 06/07/2015  8:03 PM Full Code [id number]  Bettey Costa, MD Inpatient   05/21/2015  9:42 PM 05/24/2015  4:26 PM Full Code [id number]  Henreitta Leber, MD Inpatient   04/24/2015  8:55 AM 04/26/2015 10:11 PM Full Code [id number]  Demetrios Loll, MD Inpatient   04/01/2015  3:58 PM 04/05/2015  8:07 PM Full Code [id number]  Epifanio Lesches, MD ED   03/06/2015  8:05 AM 03/09/2015  9:11 PM  Full Code [id number]  Juluis Mire, MD Inpatient   02/19/2015  4:14 PM 02/24/2015  5:41 PM Full Code [id number]  Aldean Jewett, MD Inpatient           Consults  Gi ,  nephrology  DVT Prophylaxis   SCDs   Lab Results  Component Value Date   PLT 194 03/16/2016     Time Spent in minutes   23min Greater than 50% of time spent in care coordination and counseling patient regarding the condition and plan of care. D/c with home health tomorrow.after stopping IV  Fluids,hypoglycemia does not return.  Epifanio Lesches M.D on 03/17/2016 at 8:38 AM  Between 7am to 6pm - Pager - [phone]  After 6pm go to [email or web address] - password [email or web address] [facility]   Office  [phone]

## 2016-03-17 NOTE — Progress Notes (Signed)
Patient is A&O x4. Isolation for positive c.diff. Dialysis today. On O2, 2L, which is chronic.  Gave IV pain med x1, with good relief. Gave antiemetic x1, but patient was able to eat without difficultly. Blood sugars 80, 85, 88, no coverage this shift. IV fluids running. Consent for procedure obtained.

## 2016-03-17 NOTE — Progress Notes (Signed)
Post dialysis 

## 2016-03-18 ENCOUNTER — Encounter
Admission: EM | Disposition: A | Discharge status: Home Health | Payer: Self-pay | Source: Home / Self Care | Attending: Internal Medicine

## 2016-03-18 ENCOUNTER — Inpatient Hospital Stay: Payer: Medicare Other | Admitting: Anesthesiology

## 2016-03-18 ENCOUNTER — Encounter: Payer: Self-pay | Admitting: Anesthesiology

## 2016-03-18 DIAGNOSIS — K921 Melena: Secondary | ICD-10-CM

## 2016-03-18 DIAGNOSIS — K2100 Gastro-esophageal reflux disease with esophagitis, without bleeding: Secondary | ICD-10-CM | POA: Insufficient documentation

## 2016-03-18 DIAGNOSIS — R1115 Cyclical vomiting syndrome unrelated to migraine: Secondary | ICD-10-CM | POA: Insufficient documentation

## 2016-03-18 DIAGNOSIS — G43A1 Cyclical vomiting, intractable: Secondary | ICD-10-CM

## 2016-03-18 DIAGNOSIS — K2970 Gastritis, unspecified, without bleeding: Secondary | ICD-10-CM | POA: Insufficient documentation

## 2016-03-18 HISTORY — PX: ESOPHAGOGASTRODUODENOSCOPY (EGD) WITH PROPOFOL: SHX5813

## 2016-03-18 LAB — GLUCOSE, CAPILLARY
Glucose-Capillary: 110 mg/dL — ABNORMAL HIGH (ref 65–99)
Glucose-Capillary: 67 mg/dL (ref 65–99)
Glucose-Capillary: 111 mg/dL — ABNORMAL HIGH (ref 65–99)
Glucose-Capillary: 68 mg/dL (ref 65–99)
Glucose-Capillary: 78 mg/dL (ref 65–99)
Glucose-Capillary: 90 mg/dL (ref 65–99)

## 2016-03-18 LAB — MRSA PCR SCREENING: MRSA by PCR: NEGATIVE

## 2016-03-18 SURGERY — ESOPHAGOGASTRODUODENOSCOPY (EGD) WITH PROPOFOL
Anesthesia: General

## 2016-03-18 MED ORDER — DEXTROSE 50 % IV SOLN
25.0000 mL | Freq: Once | INTRAVENOUS | Status: DC
Start: 2016-03-18 — End: 2016-03-20

## 2016-03-18 MED ORDER — SODIUM CHLORIDE 0.9 % IV SOLN
INTRAVENOUS | Status: DC | PRN
  Administered 2016-03-18: 14:00:00 via INTRAVENOUS

## 2016-03-18 MED ORDER — PROPOFOL 500 MG/50ML IV EMUL
INTRAVENOUS | Status: DC | PRN
  Administered 2016-03-18: 150 ug/kg/min via INTRAVENOUS

## 2016-03-18 MED ORDER — PROPOFOL 10 MG/ML IV BOLUS
INTRAVENOUS | Status: DC | PRN
  Administered 2016-03-18: 50 mg via INTRAVENOUS

## 2016-03-18 MED ORDER — GLYCOPYRROLATE 0.2 MG/ML IJ SOLN
INTRAMUSCULAR | Status: DC | PRN
  Administered 2016-03-18: 0.2 mg via INTRAVENOUS

## 2016-03-18 MED ORDER — LIDOCAINE 2% (20 MG/ML) 5 ML SYRINGE
INTRAMUSCULAR | Status: DC | PRN
  Administered 2016-03-18: 100 mg via INTRAVENOUS

## 2016-03-18 MED ORDER — DEXTROSE 50 % IV SOLN
INTRAVENOUS | Status: AC
  Administered 2016-03-18: 50 mL
  Filled 2016-03-18: qty 50

## 2016-03-18 NOTE — Progress Notes (Signed)
rn spoke with dr Tressia Miners re: pt back from endo. Found gastritis. md reports resume preop diet . Wean off ivf to plan discharge. rn was i/s by md to have pt eat a complete meal and d/c ivf  In 2 hrs and continue to monitor for hypoglycemia

## 2016-03-18 NOTE — Care Management Important Message (Signed)
Important Message  Patient Details  Name: [name] MRN: [id number] Date of Birth: 27-Apr-1961   Medicare Important Message Given:  Yes    Marshell Garfinkel, RN 03/18/2016, 11:39 AM

## 2016-03-18 NOTE — Anesthesia Preprocedure Evaluation (Signed)
Anesthesia Evaluation  Patient identified by MRN, date of birth, ID band Patient awake    Reviewed: Allergy & Precautions, NPO status , Patient's Chart, lab work & pertinent test results, reviewed documented beta blocker date and time   Airway Mallampati: II  TM Distance: >3 FB     Dental  (+) Chipped   Pulmonary shortness of breath, asthma , pneumonia, resolved, COPD, former smoker,           Cardiovascular hypertension, Pt. on medications + CAD, + Past MI, + Cardiac Stents and +CHF       Neuro/Psych  Headaches, PSYCHIATRIC DISORDERS Anxiety Depression    GI/Hepatic GERD  ,  Endo/Other  diabetes, Type 2  Renal/GU ESRFRenal disease     Musculoskeletal  (+) Arthritis ,   Abdominal   Peds  Hematology  (+) anemia ,   Anesthesia Other Findings Last echo EF 60%. No NTG. Allergic to dilaudid.  Reproductive/Obstetrics                             Anesthesia Physical Anesthesia Plan  ASA: III  Anesthesia Plan: General   Post-op Pain Management:    Induction: Intravenous  Airway Management Planned: Nasal Cannula  Additional Equipment:   Intra-op Plan:   Post-operative Plan:   Informed Consent: I have reviewed the patients History and Physical, chart, labs and discussed the procedure including the risks, benefits and alternatives for the proposed anesthesia with the patient or authorized representative who has indicated his/her understanding and acceptance.     Plan Discussed with: CRNA  Anesthesia Plan Comments:         Anesthesia Quick Evaluation

## 2016-03-18 NOTE — Consult Note (Signed)
[facility] Consult   Reason for Consult:  Follow-up patient with history of depression and anxiety. Referring Physician:  Mody Patient Identification: DEDIE VLIETSTRA MRN:  [id number] Principal Diagnosis: Major depressive disorder, recurrent episode, moderate ([place]) Diagnosis:   Patient Active Problem List   Diagnosis Date Noted  . Blood in stool [K92.1]   . Intractable cyclical vomiting with nausea [G43.A1]   . Reflux esophagitis [K21.0]   . Gastritis [K29.70]   . Generalized abdominal pain [R10.84]   . Uncontrollable vomiting [R11.10]   . Major depressive disorder, recurrent episode, moderate ([place]) [F33.1] 03/15/2016  . Adjustment disorder with mixed anxiety and depressed mood [F43.23] 03/15/2016  . Malnutrition of moderate degree [E44.0] 12/01/2015  . Renal mass [N28.89]   . Dyspnea [R06.00]   . Acute renal failure ([place]) [N17.9]   . Respiratory failure ([place]) [J96.90]   . High temperature [R50.9] 11/14/2015  . Pulmonary edema [J81.1]   . Encounter for central line placement [Z45.2]   . Encounter for orogastric (OG) tube placement [Z46.59]   . Nausea [R11.0] 11/12/2015  . Hyperkalemia [E87.5] 10/03/2015  . C. difficile diarrhea [A04.7] 07/22/2015  . Pneumonia [J18.9] 05/21/2015  . Hypoglycemia [E16.2] 04/24/2015  . Unresponsiveness [R40.4] 04/24/2015  . Bradycardia [R00.1] 04/24/2015  . Hypothermia [T68.XXXA] 04/24/2015  . Acute respiratory failure ([place]) [J96.00] 04/24/2015  . Acute diastolic CHF (congestive heart failure) ([place]) [I50.31] 04/05/2015  . Diabetic gastroparesis ([place]) [E11.43, K31.84] 04/05/2015  . Hypokalemia [E87.6] 04/05/2015  . Generalized weakness [R53.1] 04/05/2015  . Acute pulmonary edema ([place]) [J81.0] 04/03/2015  . Nausea and vomiting [R11.2] 04/03/2015  . Hypoglycemia associated with diabetes ([place]) [E11.649] 04/03/2015  . Anemia of chronic disease [D63.8] 04/03/2015  . Secondary hyperparathyroidism ([place]) [N25.81] 04/03/2015  . Pressure  ulcer [L89.90] 04/02/2015  . Acute respiratory failure with hypoxia ([place]) [J96.01] 04/01/2015  . Adjustment disorder with anxiety [F43.22] 03/14/2015  . Somatic symptom disorder, mild [F45.8] 03/08/2015  . Coronary artery disease involving native coronary artery of native heart without angina pectoris [I25.10]   . Nausea & vomiting [R11.2] 03/06/2015  . Abdominal pain [R10.9] 03/06/2015  . DM (diabetes mellitus) ([place]) [E11.9] 03/06/2015  . HTN (hypertension) [I10] 03/06/2015  . Gastroparesis [K31.84] 02/24/2015  . Pleural effusion [J90] 02/19/2015  . HCAP (healthcare-associated pneumonia) [J18.9] 02/19/2015  . End-stage renal disease on hemodialysis ([place]) [N18.6, Z99.2] 02/19/2015    Total Time spent with patient: 20 minutes  Subjective:   LINDLEY LANZI is a 55 y.o. female patient admitted with "I had been sick".  HPI:  Follow-up Tuesday the 27th. Patient seen. Chart reviewed. She says she is feeling much better today. She feels emotionally much more stable. Not feeling depressed today. No suicidal thoughts. Doesn't feel hopeless or agitated. Affect was upbeat and she was appropriately talkative and optimistic.  Past Psychiatric History: Long history of both anxiety and depression in the past. No history of suicide attempts does well with chronic citalopram  Risk to Self: Is patient at risk for suicide?: No Risk to Others:   Prior Inpatient Therapy:   Prior Outpatient Therapy:    Past Medical History:  Past Medical History  Diagnosis Date  . Asthma   . Collagen vascular disease ([place])   . Diabetes mellitus without complication ([place])   . Hypertension   . Coronary artery disease     a. cath 2013: stenting to RCA (report not available); b. cath 2014: LM nl, pLAD 40%, mLAD nl, ost LCx 40%, mid LCx nl, pRCA 30% @  site of prior stent, mRCA 50%  . Diabetic neuropathy ([place])   . ESRD (end stage renal disease) on dialysis ([place])     M-W-F  . GERD (gastroesophageal reflux disease)   . Hx  of pancreatitis 2015  . Myocardial infarction ([place])   . Pneumonia   . Mitral regurgitation     a. echo 10/2013: EF 62%, noWMA, mildly dilated LA, mild to mod MR/TR, GR1DD  . COPD (chronic obstructive pulmonary disease) ([place])   . chronic diastolic CHF [id number]  . dialysis 2006  . Depression   . Anxiety   . Arthritis   . Headache   . Anemia   . Renal insufficiency     Past Surgical History  Procedure Laterality Date  . Cholecystectomy    . Appendectomy    . Abdominal hysterectomy    . Dialysis fistula creation Left     upper arm  . Esophagogastroduodenoscopy N/A 03/08/2015    Procedure: ESOPHAGOGASTRODUODENOSCOPY (EGD);  Surgeon: Manya Silvas, MD;  Location: [facility] ENDOSCOPY;  Service: Endoscopy;  Laterality: N/A;  . Cardiac catheterization Left 07/26/2015    Procedure: Left Heart Cath and Coronary Angiography;  Surgeon: Dionisio David, MD;  Location: [facility] CV LAB;  Service: Cardiovascular;  Laterality: Left;  Marland Kitchen Eye surgery    . Fecal transplant N/A 08/23/2015    Procedure: FECAL TRANSPLANT;  Surgeon: Manya Silvas, MD;  Location: [facility] ENDOSCOPY;  Service: Endoscopy;  Laterality: N/A;  . Peripheral vascular catheterization N/A 12/20/2015    Procedure: Thrombectomy of dialysis access versus permcath placement;  Surgeon: Algernon Huxley, MD;  Location: [facility] CV LAB;  Service: Cardiovascular;  Laterality: N/A;  . Peripheral vascular catheterization N/A 12/20/2015    Procedure: A/V Shunt Intervention;  Surgeon: Algernon Huxley, MD;  Location: [place] [facility] CV LAB;  Service: Cardiovascular;  Laterality: N/A;  . Peripheral vascular catheterization N/A 12/20/2015    Procedure: A/V Shuntogram/Fistulagram;  Surgeon: Algernon Huxley, MD;  Location: [place] CV LAB;  Service: Cardiovascular;  Laterality: N/A;  . Peripheral vascular catheterization N/A 01/02/2016    Procedure: A/V Shuntogram/Fistulagram;  Surgeon: Algernon Huxley, MD;  Location: [place] CV LAB;  Service:  Cardiovascular;  Laterality: N/A;  . Peripheral vascular catheterization N/A 01/02/2016    Procedure: A/V Shunt Intervention;  Surgeon: Algernon Huxley, MD;  Location: [facility] CV LAB;  Service: Cardiovascular;  Laterality: N/A;   Family History:  Family History  Problem Relation Age of Onset  . Kidney disease Mother   . Diabetes Mother    Family Psychiatric  History: Positive for anxiety Social History:  History  Alcohol Use No     History  Drug Use No    Social History   Social History  . Marital Status: Divorced    Spouse Name: N/A  . Number of Children: N/A  . Years of Education: N/A   Social History Main Topics  . Smoking status: Former Smoker -- 0.50 packs/day    Types: Cigarettes    Quit date: 02/13/2015  . Smokeless tobacco: Never Used  . Alcohol Use: No  . Drug Use: No  . Sexual Activity: Not Asked   Other Topics Concern  . None   Social History Narrative   Additional Social History:    Allergies:   Allergies  Allergen Reactions  . Compazine [Prochlorperazine Edisylate] Anaphylaxis and Nausea And Vomiting    23 Jul - patient relates that she takes promethazine frequently with no problems.  . Ace  Inhibitors Swelling  . Ativan [Lorazepam] Other (See Comments)    Reaction:  Hallucinations and headaches  . Codeine Nausea And Vomiting  . Dilaudid [Hydromorphone Hcl] Other (See Comments)    Delirium  . Gabapentin Other (See Comments)    Reaction:  Unknown   . Losartan Other (See Comments)    Reaction:  Unknown   . Ondansetron Other (See Comments)    Reaction:  Unknown   . Prochlorperazine Other (See Comments)    Reaction:  Unknown   . Scopolamine Other (See Comments)    Reaction:  Unknown   . Zofran [Ondansetron Hcl] Other (See Comments)    Reaction:  hallucinations   . Oxycodone Anxiety  . Tape Rash    Labs:  Results for orders placed or performed during the hospital encounter of 03/15/16 (from the past 48 hour(s))  Glucose, capillary      Status: Abnormal   Collection Time: 03/16/16  9:01 PM  Result Value Ref Range   Glucose-Capillary 119 (H) 65 - 99 mg/dL  Glucose, capillary     Status: None   Collection Time: 03/17/16  7:51 AM  Result Value Ref Range   Glucose-Capillary 80 65 - 99 mg/dL  Glucose, capillary     Status: None   Collection Time: 03/17/16 11:29 AM  Result Value Ref Range   Glucose-Capillary 85 65 - 99 mg/dL  Glucose, capillary     Status: None   Collection Time: 03/17/16  3:59 PM  Result Value Ref Range   Glucose-Capillary 88 65 - 99 mg/dL  Glucose, capillary     Status: None   Collection Time: 03/17/16  4:23 PM  Result Value Ref Range   Glucose-Capillary 87 65 - 99 mg/dL  Glucose, capillary     Status: None   Collection Time: 03/17/16  9:04 PM  Result Value Ref Range   Glucose-Capillary 71 65 - 99 mg/dL   Comment 1 Notify RN   Glucose, capillary     Status: None   Collection Time: 03/17/16 10:38 PM  Result Value Ref Range   Glucose-Capillary 84 65 - 99 mg/dL   Comment 1 Notify RN   Glucose, capillary     Status: Abnormal   Collection Time: 03/18/16  7:28 AM  Result Value Ref Range   Glucose-Capillary 110 (H) 65 - 99 mg/dL  MRSA PCR Screening     Status: None   Collection Time: 03/18/16 10:46 AM  Result Value Ref Range   MRSA by PCR NEGATIVE NEGATIVE    Comment:        The GeneXpert MRSA Assay (FDA approved for NASAL specimens only), is one component of a comprehensive MRSA colonization surveillance program. It is not intended to diagnose MRSA infection nor to guide or monitor treatment for MRSA infections.   Glucose, capillary     Status: None   Collection Time: 03/18/16 11:27 AM  Result Value Ref Range   Glucose-Capillary 67 65 - 99 mg/dL  Glucose, capillary     Status: Abnormal   Collection Time: 03/18/16 12:08 PM  Result Value Ref Range   Glucose-Capillary 111 (H) 65 - 99 mg/dL  Glucose, capillary     Status: None   Collection Time: 03/18/16  5:01 PM  Result Value Ref Range    Glucose-Capillary 68 65 - 99 mg/dL    Current Facility-Administered Medications  Medication Dose Route Frequency Provider Last Rate Last Dose  . acetaminophen (TYLENOL) tablet 650 mg  650 mg Oral Q6H PRN Bettey Costa, MD  650 mg at 03/17/16 0415   Or  . acetaminophen (TYLENOL) suppository 650 mg  650 mg Rectal Q6H PRN Bettey Costa, MD      . acidophilus (RISAQUAD) capsule 1 capsule  1 capsule Oral Daily Bettey Costa, MD   1 capsule at 03/18/16 1728  . albuterol (PROVENTIL) (2.5 MG/3ML) 0.083% nebulizer solution 2.5 mg  2.5 mg Nebulization Q4H PRN Sital Mody, MD      . amLODipine (NORVASC) tablet 10 mg  10 mg Oral Daily Bettey Costa, MD   10 mg at 03/17/16 1344  . aspirin EC tablet 81 mg  81 mg Oral Oretha Ellis, MD   81 mg at 03/17/16 0558  . atorvastatin (LIPITOR) tablet 20 mg  20 mg Oral QPM Bettey Costa, MD   20 mg at 03/18/16 1728  . bisacodyl (DULCOLAX) EC tablet 5 mg  5 mg Oral Daily PRN Bettey Costa, MD      . bisacodyl (DULCOLAX) EC tablet 5 mg  5 mg Oral Daily PRN Bettey Costa, MD      . cholecalciferol (VITAMIN D) tablet 1,000 Units  1,000 Units Oral Daily Bettey Costa, MD   1,000 Units at 03/17/16 1343  . citalopram (CELEXA) tablet 20 mg  20 mg Oral Oretha Ellis, MD   20 mg at 03/18/16 1728  . clopidogrel (PLAVIX) tablet 75 mg  75 mg Oral Daily Bettey Costa, MD   75 mg at 03/17/16 1343  . dextrose 5 %-0.45 % sodium chloride infusion   Intravenous Continuous Gladstone Lighter, MD 70 mL/hr at 03/18/16 1230    . dextrose 50 % solution 25 mL  25 mL Intravenous Once Gladstone Lighter, MD      . haloperidol (HALDOL) tablet 1 mg  1 mg Oral BID Jolanta B Pucilowska, MD   1 mg at 03/17/16 2212  . heparin injection 5,000 Units  5,000 Units Subcutaneous Q8H Bettey Costa, MD   5,000 Units at 03/17/16 2212  . hydrALAZINE (APRESOLINE) tablet 25 mg  25 mg Oral TID Bettey Costa, MD   25 mg at 03/18/16 1728  . HYDROmorphone (DILAUDID) injection 0.5 mg  0.5 mg Intravenous Q4H PRN Bettey Costa, MD   0.5 mg at  03/17/16 2324  . insulin aspart (novoLOG) injection 0-5 Units  0-5 Units Subcutaneous QHS Bettey Costa, MD   0 Units at 03/15/16 2106  . insulin aspart (novoLOG) injection 0-9 Units  0-9 Units Subcutaneous TID WC Bettey Costa, MD   0 Units at 03/15/16 1647  . iopamidol (ISOVUE-300) 61 % injection 100 mL  100 mL Intravenous Once PRN Bettey Costa, MD      . lipase/protease/amylase (CREON) capsule 12,000 Units  12,000 Units Oral With snacks Bettey Costa, MD   12,000 Units at 03/18/16 1727  . lipase/protease/amylase (CREON) capsule 24,000 Units  24,000 Units Oral Q lunch Bettey Costa, MD   24,000 Units at 03/17/16 1529  . lipase/protease/amylase (CREON) capsule 36,000 Units  36,000 Units Oral BID AC & HS Bettey Costa, MD   36,000 Units at 03/17/16 2211  . loratadine (CLARITIN) tablet 10 mg  10 mg Oral Daily Bettey Costa, MD   10 mg at 03/17/16 1343  . metoCLOPramide (REGLAN) tablet 5 mg  5 mg Oral TID AC & HS Lavonia Dana, MD   5 mg at 03/18/16 1727  . multivitamin (RENA-VIT) tablet 1 tablet  1 tablet Oral Daily Bettey Costa, MD   1 tablet at 03/17/16 1344  . nitroGLYCERIN (NITROSTAT) SL tablet 0.4 mg  0.4 mg Sublingual Q5 Min x 3 PRN Bettey Costa, MD      . omega-3 acid ethyl esters (LOVAZA) capsule 1 g  1 g Oral Daily Bettey Costa, MD   1 g at 03/17/16 1343  . pantoprazole (PROTONIX) EC tablet 40 mg  40 mg Oral BID AC Bettey Costa, MD   40 mg at 03/18/16 1729  . polyethylene glycol (MIRALAX / GLYCOLAX) packet 17 g  17 g Oral Daily PRN Bettey Costa, MD      . promethazine (PHENERGAN) injection 25 mg  25 mg Intravenous Q6H PRN Harrie Foreman, MD   25 mg at 03/17/16 2324  . ranolazine (RANEXA) 12 hr tablet 1,000 mg  1,000 mg Oral BID Bettey Costa, MD   1,000 mg at 03/17/16 2210  . sevelamer carbonate (RENVELA) tablet 800 mg  800 mg Oral TID WC Bettey Costa, MD   800 mg at 03/18/16 1727  . sodium phosphate (FLEET) 7-19 GM/118ML enema 1 enema  1 enema Rectal Once PRN Bettey Costa, MD      . vancomycin (VANCOCIN) 50 mg/mL oral  solution 125 mg  125 mg Oral Q6H Crystal G Scarpena, RPH   125 mg at 03/18/16 1730  . vitamin B-12 (CYANOCOBALAMIN) tablet 1,000 mcg  1,000 mcg Oral Daily Bettey Costa, MD   1,000 mcg at 03/15/16 1406    Musculoskeletal: Strength & Muscle Tone: decreased Gait & Station: unsteady Patient leans: N/A  Psychiatric Specialty Exam: Physical Exam   ROS   Blood pressure 130/68, pulse 74, temperature 98 F (36.7 C), temperature source Oral, resp. rate 16, height 5\' 5"  (1.651 m), weight 77.338 kg (170 lb 8 oz), SpO2 98 %.Body mass index is 28.37 kg/(m^2).  General Appearance: Disheveled  Eye Contact:  Good  Speech:  Slow  Volume:  Decreased  Mood:  Dysphoric  Affect:  Constricted  Thought Process:  Goal Directed  Orientation:  Full (Time, Place, and Person)  Thought Content:  Logical  Suicidal Thoughts:  No  Homicidal Thoughts:  No  Memory:  Immediate;   Fair Recent;   Fair Remote;   Fair  Judgement:  Fair  Insight:  Fair  Psychomotor Activity:  Normal  Concentration:  Concentration: Fair  Recall:  [employer] of Knowledge:  Fair  Language:  Fair  Akathisia:  No  Handed:  Right  AIMS (if indicated):     Assets:  Communication Skills Desire for Improvement Housing Resilience  ADL's:  Intact  Cognition:  WNL  Sleep:        Treatment Plan Summary: Plan No change to medicine. Supportive counseling. Encouraged her to be optimistic and to keep working with therapy and the medical staff. No sign of psychosis. I will follow-up as needed.  Disposition: Patient does not meet criteria for psychiatric inpatient admission. Supportive therapy provided about ongoing stressors.  Alethia Berthold, MD 03/18/2016 6:34 PM  Ms. Olvey denies symptoms of depression or anxiety. She is not suicidal or homicidal. She complains mostly of physical discomfort in the abdomen nausea and vomiting. She refused to eat lunch. Yesterday she told me about auditory hallucinations, hearing songs in her head all the  time, and visual hallucinations, seeing crowds of people with a corner of her eye. And that this happens only when she is physically sick. She was started on Haldol 1 mg twice daily. Today she did not see any crowds by would see her daughter and granddaughter even though they were not there. She still hears the  music.  PLAN: 1. The patient does not meet criteria for psychiatric hospitalization. Discharge is appropriate.  2. We'll continue Celexa as prescribed by her primary provider and Haldol for now.  3. Dr. Shelbie Ammons will follow along on Monday.

## 2016-03-18 NOTE — Transfer of Care (Signed)
Immediate Anesthesia Transfer of Care Note  Patient: [name]  Procedure(s) Performed: Procedure(s): ESOPHAGOGASTRODUODENOSCOPY (EGD) WITH PROPOFOL (N/A)  Patient Location: PACU  Anesthesia Type:General  Level of Consciousness: awake  Airway & Oxygen Therapy: Patient connected to nasal cannula oxygen  Post-op Assessment: Post -op Vital signs reviewed and stable  Post vital signs: stable  Last Vitals:  Filed Vitals:   03/18/16 0732 03/18/16 1441  BP: 112/54 125/52  Pulse: 66 72  Temp: 36.8 C 35.9 C  Resp: 20 12    Last Pain:  Filed Vitals:   03/18/16 1442  PainSc: Asleep         Complications: No apparent anesthesia complications

## 2016-03-18 NOTE — Progress Notes (Signed)
[facility] [place] NAME: [name]    MR#:  [id number]  DATE OF BIRTH:  02/05/1961  SUBJECTIVE:  CHIEF COMPLAINT:   Chief Complaint  Patient presents with  . Emesis   - for EGD today for dark stools, no further vomiting - Blood sugar borderline low, on D5 as NPO today - for dialysis tomorrow per schedule - No hallucinations today  REVIEW OF SYSTEMS:  Review of Systems  Constitutional: Positive for malaise/fatigue. Negative for fever and chills.  HENT: Negative for ear discharge, ear pain and nosebleeds.   Eyes: Negative for blurred vision and double vision.  Respiratory: Negative for cough, shortness of breath and wheezing.   Cardiovascular: Negative for chest pain, palpitations and leg swelling.  Gastrointestinal: Positive for nausea. Negative for vomiting, abdominal pain, diarrhea and constipation.  Genitourinary: Negative for dysuria and urgency.  Musculoskeletal: Negative for myalgias.  Neurological: Negative for dizziness, speech change, focal weakness, seizures and headaches.  Psychiatric/Behavioral: Positive for hallucinations. Negative for depression. The patient is nervous/anxious.     DRUG ALLERGIES:   Allergies  Allergen Reactions  . Compazine [Prochlorperazine Edisylate] Anaphylaxis and Nausea And Vomiting    57 Jul - patient relates that she takes promethazine frequently with no problems.  . Ace Inhibitors Swelling  . Ativan [Lorazepam] Other (See Comments)    Reaction:  Hallucinations and headaches  . Codeine Nausea And Vomiting  . Dilaudid [Hydromorphone Hcl] Other (See Comments)    Delirium  . Gabapentin Other (See Comments)    Reaction:  Unknown   . Losartan Other (See Comments)    Reaction:  Unknown   . Ondansetron Other (See Comments)    Reaction:  Unknown   . Prochlorperazine Other (See Comments)    Reaction:  Unknown   . Scopolamine Other (See Comments)    Reaction:  Unknown   . Zofran  [Ondansetron Hcl] Other (See Comments)    Reaction:  hallucinations   . Oxycodone Anxiety  . Tape Rash    VITALS:  Blood pressure 112/54, pulse 66, temperature 98.3 F (36.8 C), temperature source Oral, resp. rate 20, height 5\' 5"  (1.651 m), weight 77.338 kg (170 lb 8 oz), SpO2 99 %.  PHYSICAL EXAMINATION:  Physical Exam  GENERAL:  55 y.o.-year-old patient lying in the bed with no acute distress.  EYES: Pupils equal, round, reactive to light and accommodation. No scleral icterus. Extraocular muscles intact.  HEENT: Head atraumatic, normocephalic. Oropharynx and nasopharynx clear. Dry mucous membranes NECK:  Supple, no jugular venous distention. No thyroid enlargement, no tenderness.  LUNGS: Normal breath sounds bilaterally, no wheezing, rales,rhonchi or crepitation. No use of accessory muscles of respiration.  CARDIOVASCULAR: S1, S2 normal. No murmurs, rubs, or gallops.  ABDOMEN: Soft, nontender, nondistended. Bowel sounds present. No organomegaly or mass.  EXTREMITIES: No pedal edema, cyanosis, or clubbing. Left arm AV fistula NEUROLOGIC: Cranial nerves II through XII are intact. Muscle strength 5/5 in all extremities. Sensation intact. Gait not checked.  PSYCHIATRIC: The patient is alert and oriented x 3.  SKIN: No obvious rash, lesion, or ulcer.    LABORATORY PANEL:   CBC  Recent Labs Lab 03/16/16 0404  WBC 5.1  HGB 10.0*  HCT 31.5*  PLT 194   ------------------------------------------------------------------------------------------------------------------  Chemistries   Recent Labs Lab 03/15/16 0742  NA 136  K 3.9  CL 97*  CO2 29  GLUCOSE 124*  BUN 26*  CREATININE 5.63*  CALCIUM 8.4*  AST 27  ALT  15  ALKPHOS 106  BILITOT 0.9   ------------------------------------------------------------------------------------------------------------------  Cardiac Enzymes  Recent Labs Lab 03/14/16 0810  TROPONINI <0.03    ------------------------------------------------------------------------------------------------------------------  RADIOLOGY:  No results found.  EKG:   Orders placed or performed during the hospital encounter of 03/15/16  . ED EKG  . ED EKG    ASSESSMENT AND PLAN:   55 year old female with end-stage renal disease on Monday Wednesday Friday hemodialysis, coronary artery disease, chronic pancreatitis with chronic nausea and abdominal pain, pressure and anxiety presents to the hospital secondary to worsening abdominal pain and dark stools.  #1 abdominal pain-acute on chronic pancreatitis. Has chronic abdominal pain. Lipase is normal -Pain medications as needed. -Appreciate GI consult. Advance diet as tolerated. -Continue pancreatic supplements  #2 dark stools-stable hemoglobin. For EGD today. Possible underlying gastritis. Patient on aspirin and Plavix. -On PPI  #3 hypertension-on Norvasc and hydralazine  #4 depression and anxiety with hallucinations-appreciate psych consult. Started on Haldol twice a day. Cannot get Ativan due to causing worsening of hallucinations. Continue Celexa  #5 end-stage renal disease on hemodialysis-on Monday Wednesday Friday schedule. Dialysis tomorrow per schedule.  #6 DVT prophylaxis-on subcutaneous heparin  Physical therapy tomorrow.    All the records are reviewed and case discussed with Care Management/Social Workerr. Management plans discussed with the patient, family and they are in agreement.  CODE STATUS: Full Code  TOTAL TIME TAKING CARE OF THIS PATIENT: 35 minutes.   POSSIBLE D/C IN 1-2 DAYS, DEPENDING ON CLINICAL CONDITION.   Gladstone Lighter M.D on 03/18/2016 at 10:48 AM  Between 7am to 6pm - Pager - [phone]  After 6pm go to [email or web address] - password [email or web address]  Office  [phone]  CC: Primary care physician; Ellamae Sia, MD

## 2016-03-18 NOTE — Anesthesia Postprocedure Evaluation (Signed)
Anesthesia Post Note  Patient: [name]  Procedure(s) Performed: Procedure(s) (LRB): ESOPHAGOGASTRODUODENOSCOPY (EGD) WITH PROPOFOL (N/A)  Patient location during evaluation: Endoscopy Anesthesia Type: General Level of consciousness: awake and alert Pain management: pain level controlled Vital Signs Assessment: post-procedure vital signs reviewed and stable Respiratory status: spontaneous breathing, nonlabored ventilation, respiratory function stable and patient connected to nasal cannula oxygen Cardiovascular status: blood pressure returned to baseline and stable Postop Assessment: no signs of nausea or vomiting Anesthetic complications: no    Last Vitals:  Filed Vitals:   03/18/16 1511 03/18/16 1546  BP: 138/53 130/68  Pulse: 74 74  Temp:  36.7 C  Resp: 16 16    Last Pain:  Filed Vitals:   03/18/16 1549  PainSc: Asleep                 Teigen Parslow S

## 2016-03-18 NOTE — Op Note (Signed)
[facility] Gastroenterology Patient Name: [name] Procedure Date: 03/18/2016 2:22 PM MRN: [id number] Account #: [id number] Date of Birth: 1961-01-20 Admit Type: Inpatient Age: 55 Room: [facility] ENDO ROOM 1 Gender: Female Note Status: Finalized Procedure:            Upper GI endoscopy Indications:          Melena, Nausea with vomiting Providers:            [name], MD Medicines:            Propofol per Anesthesia Complications:        No immediate complications. Procedure:            Pre-Anesthesia Assessment:                       - Prior to the procedure, a History and Physical was                        performed, and patient medications and allergies were                        reviewed. The patient's tolerance of previous                        anesthesia was also reviewed. The risks and benefits of                        the procedure and the sedation options and risks were                        discussed with the patient. All questions were                        answered, and informed consent was obtained. Prior                        Anticoagulants: The patient has taken no previous                        anticoagulant or antiplatelet agents. ASA Grade                        Assessment: II - A patient with mild systemic disease.                        After reviewing the risks and benefits, the patient was                        deemed in satisfactory condition to undergo the                        procedure.                       After obtaining informed consent, the endoscope was                        passed under direct vision. Throughout the procedure,                        the patient's blood pressure,  pulse, and oxygen                        saturations were monitored continuously. The Endoscope                        was introduced through the mouth, and advanced to the                        second part of duodenum. The upper GI endoscopy was                         accomplished without difficulty. The patient tolerated                        the procedure well. Findings:      LA Grade A (one or more mucosal breaks less than 5 mm, not extending       between tops of 2 mucosal folds) esophagitis with no bleeding was found       at the gastroesophageal junction.      Localized mild inflammation characterized by erythema was found in the       gastric antrum. Biopsies were taken with a cold forceps for histology.      The examined duodenum was normal. Impression:           - LA Grade A reflux esophagitis.                       - Gastritis. Biopsied.                       - Normal examined duodenum. Recommendation:       - Use a proton pump inhibitor PO daily. Procedure Code(s):    --- Professional ---                       [phone], Esophagogastroduodenoscopy, flexible, transoral;                        with biopsy, single or multiple Diagnosis Code(s):    --- Professional ---                       K92.1, Melena (includes Hematochezia)                       R11.2, Nausea with vomiting, unspecified                       K21.0, Gastro-esophageal reflux disease with esophagitis                       K29.70, Gastritis, unspecified, without bleeding CPT copyright 2016 American Medical Association. All rights reserved. The codes documented in this report are preliminary and upon coder review may  be revised to meet current compliance requirements. [name], MD 03/18/2016 2:35:56 PM This report has been signed electronically. Number of Addenda: 0 Note Initiated On: 03/18/2016 2:22 PM      [facility]

## 2016-03-18 NOTE — Progress Notes (Signed)
fsbs 67. Pt is npo . rn spoke with dr Tressia Miners . md orders 1/2 amp d50 and increase inf to 70/hr until after  endoscopy

## 2016-03-18 NOTE — Care Management Note (Signed)
Patient is active at [facility] on MWF.  I am sending them admission records today and will follow up with additional records at discharge.  [name]  Dialysis Coordinator  [phone]

## 2016-03-18 NOTE — OR Nursing (Signed)
Dentures returned to patient.  Tolerated diet coke well.  No c/o pain.  Ready for transfer back to unit.

## 2016-03-18 NOTE — Progress Notes (Signed)
[facility]  ROUNDING NOTE   Subjective:   Admitted for nausea/vomiting/abdominal pain. Was taken off metoclopramide last week due to extra-pyramidal side effects.  Resting quietly No acute c/o    Objective:  Vital signs in last 24 hours:  Temp:  [97.6 F (36.4 C)-98.8 F (37.1 C)] 98.3 F (36.8 C) (06/27 0732) Pulse Rate:  [66-77] 66 (06/27 0732) Resp:  [16-20] 20 (06/27 0732) BP: (112-152)/(53-81) 112/54 mmHg (06/27 0732) SpO2:  [97 %-100 %] 99 % (06/27 0732) Weight:  [75.4 kg (166 lb 3.6 oz)-77.338 kg (170 lb 8 oz)] 77.338 kg (170 lb 8 oz) (06/27 0500)  Weight change: 4.514 kg (9 lb 15.2 oz) Filed Weights   03/17/16 0929 03/17/16 1252 03/18/16 0500  Weight: 76.5 kg (168 lb 10.4 oz) 75.4 kg (166 lb 3.6 oz) 77.338 kg (170 lb 8 oz)    Intake/Output: I/O last 3 completed shifts: In: 1800 [I.V.:1800] Out: 1002 [Other:1000; Stool:2]   Intake/Output this shift:     Physical Exam: General: NAD, laying in bed  Head: Normocephalic, atraumatic. Moist oral mucosal membranes  Eyes: Anicteric,  Neck: Supple, trachea midline  Lungs:  Normal effort  Heart: Regular rate and rhythm  Abdomen:  Soft, nontender, obese  Extremities: no peripheral edema.  Neurologic: Nonfocal, moving all four extremities  Skin: No lesions  Access: Left arm AVG    Basic Metabolic Panel:  Recent Labs Lab 03/14/16 0810 03/15/16 0742  NA 133* 136  K 3.8 3.9  CL 94* 97*  CO2 30 29  GLUCOSE 80 124*  BUN 20 26*  CREATININE 4.46* 5.63*  CALCIUM 8.5* 8.4*    Liver Function Tests:  Recent Labs Lab 03/14/16 0810 03/15/16 0742  AST 22 27  ALT 14 15  ALKPHOS 104 106  BILITOT 0.8 0.9  PROT 7.9 7.8  ALBUMIN 3.1* 3.1*    Recent Labs Lab 03/14/16 0810 03/15/16 0742  LIPASE <10* 18   No results for input(s): AMMONIA in the last 168 hours.  CBC:  Recent Labs Lab 03/14/16 0810 03/15/16 0742 03/16/16 0404  WBC 6.1 5.2 5.1  NEUTROABS 2.7  --   --   HGB 9.9* 9.9*  10.0*  HCT 31.0* 31.0* 31.5*  MCV 77.4* 78.9* 78.3*  PLT 202 192 194    Cardiac Enzymes:  Recent Labs Lab 03/14/16 0810  TROPONINI <0.03    BNP: Invalid input(s): POCBNP  CBG:  Recent Labs Lab 03/17/16 1559 03/17/16 1623 03/17/16 2104 03/17/16 2238 03/18/16 0728  GLUCAP 88 87 71 84 110*    Microbiology: Results for orders placed or performed during the hospital encounter of 03/15/16  Gastrointestinal Panel by PCR , Stool     Status: None   Collection Time: 03/16/16  2:25 PM  Result Value Ref Range Status   Campylobacter species NOT DETECTED NOT DETECTED Final   Plesimonas shigelloides NOT DETECTED NOT DETECTED Final   Salmonella species NOT DETECTED NOT DETECTED Final   Yersinia enterocolitica NOT DETECTED NOT DETECTED Final   Vibrio species NOT DETECTED NOT DETECTED Final   Vibrio cholerae NOT DETECTED NOT DETECTED Final   Enteroaggregative E coli (EAEC) NOT DETECTED NOT DETECTED Final   Enteropathogenic E coli (EPEC) NOT DETECTED NOT DETECTED Final   Enterotoxigenic E coli (ETEC) NOT DETECTED NOT DETECTED Final   Shiga like toxin producing E coli (STEC) NOT DETECTED NOT DETECTED Final   E. coli O157 NOT DETECTED NOT DETECTED Final   Shigella/Enteroinvasive E coli (EIEC) NOT DETECTED NOT DETECTED Final  Cryptosporidium NOT DETECTED NOT DETECTED Final   Cyclospora cayetanensis NOT DETECTED NOT DETECTED Final   Entamoeba histolytica NOT DETECTED NOT DETECTED Final   Giardia lamblia NOT DETECTED NOT DETECTED Final   Adenovirus F40/41 NOT DETECTED NOT DETECTED Final   Astrovirus NOT DETECTED NOT DETECTED Final   Norovirus GI/GII NOT DETECTED NOT DETECTED Final   Rotavirus A NOT DETECTED NOT DETECTED Final   Sapovirus (I, II, IV, and V) NOT DETECTED NOT DETECTED Final  C difficile quick scan w PCR reflex     Status: Abnormal   Collection Time: 03/16/16  2:25 PM  Result Value Ref Range Status   C Diff antigen POSITIVE (A) NEGATIVE Final   C Diff toxin NEGATIVE  NEGATIVE Final   C Diff interpretation   Final    Positive for toxigenic C. difficile, active toxin production not detected. Patient has toxigenic C. difficile organisms present in the bowel, but toxin was not detected. The patient may be a carrier or the level of toxin in the sample was below the limit  of detection. This information should be used in conjunction with the patient's clinical history when deciding on possible therapy.     Comment: CRITICAL RESULT CALLED TO, READ BACK BY AND VERIFIED WITH: Daleen Snook @ [id number] 03/16/16 by [facility]   Clostridium Difficile by PCR     Status: Abnormal   Collection Time: 03/16/16  2:25 PM  Result Value Ref Range Status   Toxigenic C Difficile by pcr POSITIVE (A) NEGATIVE Final    Comment: CRITICAL RESULT CALLED TO, READ BACK BY AND VERIFIED WITH: Daleen Snook @ [id number] 03/16/16 by [facility]     Coagulation Studies: No results for input(s): LABPROT, INR in the last 72 hours.  Urinalysis: No results for input(s): COLORURINE, LABSPEC, PHURINE, GLUCOSEU, HGBUR, BILIRUBINUR, KETONESUR, PROTEINUR, UROBILINOGEN, NITRITE, LEUKOCYTESUR in the last 72 hours.  Invalid input(s): APPERANCEUR    Imaging: No results found.   Medications:   . dextrose 5 % and 0.45% NaCl 50 mL/hr at 03/18/16 0947   . acidophilus  1 capsule Oral Daily  . amLODipine  10 mg Oral Daily  . aspirin EC  81 mg Oral BH-q7a  . atorvastatin  20 mg Oral QPM  . cholecalciferol  1,000 Units Oral Daily  . citalopram  20 mg Oral BH-q7a  . clopidogrel  75 mg Oral Daily  . docusate sodium  100 mg Oral BID  . haloperidol  1 mg Oral BID  . heparin  5,000 Units Subcutaneous Q8H  . hydrALAZINE  25 mg Oral TID  . insulin aspart  0-5 Units Subcutaneous QHS  . insulin aspart  0-9 Units Subcutaneous TID WC  . lipase/protease/amylase  12,000 Units Oral With snacks  . lipase/protease/amylase  24,000 Units Oral Q lunch  . lipase/protease/amylase  36,000 Units Oral BID AC & HS  . loratadine  10 mg  Oral Daily  . metoCLOPramide  5 mg Oral TID AC & HS  . multivitamin  1 tablet Oral Daily  . omega-3 acid ethyl esters  1 g Oral Daily  . pantoprazole  40 mg Oral BID AC  . ranolazine  1,000 mg Oral BID  . sevelamer carbonate  800 mg Oral TID WC  . vancomycin  125 mg Oral Q6H  . vitamin B-12  1,000 mcg Oral Daily   acetaminophen **OR** acetaminophen, albuterol, bisacodyl, bisacodyl, HYDROmorphone (DILAUDID) injection, iopamidol, nitroGLYCERIN, polyethylene glycol, promethazine, sodium phosphate  Assessment/ Plan:  [name] is a 55 y.o.  black female with insulin dependent diabetes, hypertension, hyperlipidemia, diabetic retinopathy, diabetic neuropathy, diabetic gastroparesis, pancreatic insufficiency, depression, GERD. Renal masses - avoiding epo   MWF [facility] [address]   1. End Stage Renal Disease: no acute indication for dialysis.  - Continue MWF schedule.  - next HD planned for tomorrow  2. Anemia of chronic kidney disease: hemoglobin 10.  Not getting erythropoeitin stimulating agent as outpatient due to renal masses. However CT from 6/24 does not seem to show any renal masses.   3. Secondary Hyperparathyroidism: PTH 284 as outpatient. Phos 5.5  - Continue sevelamer    LOS: 3 Lynnleigh Soden 6/27/201711:00 AM

## 2016-03-19 ENCOUNTER — Encounter: Payer: Self-pay | Admitting: Gastroenterology

## 2016-03-19 LAB — BASIC METABOLIC PANEL
Anion gap: 14 (ref 5–15)
BUN: 31 mg/dL — ABNORMAL HIGH (ref 6–20)
CO2: 23 mmol/L (ref 22–32)
Calcium: 8.6 mg/dL — ABNORMAL LOW (ref 8.9–10.3)
Chloride: 94 mmol/L — ABNORMAL LOW (ref 101–111)
Creatinine, Ser: 5.75 mg/dL — ABNORMAL HIGH (ref 0.44–1.00)
GFR calc Af Amer: 9 mL/min — ABNORMAL LOW (ref >60)
GFR calc non Af Amer: 7 mL/min — ABNORMAL LOW (ref >60)
Glucose, Bld: 252 mg/dL — ABNORMAL HIGH (ref 65–99)
Potassium: 5.5 mmol/L — ABNORMAL HIGH (ref 3.5–5.1)
Sodium: 131 mmol/L — ABNORMAL LOW (ref 135–145)

## 2016-03-19 LAB — CBC
HCT: 29.2 % — ABNORMAL LOW (ref 35.0–47.0)
Hemoglobin: 9.4 g/dL — ABNORMAL LOW (ref 12.0–16.0)
MCH: 24.3 pg — ABNORMAL LOW (ref 26.0–34.0)
MCHC: 32.2 g/dL (ref 32.0–36.0)
MCV: 75.6 fL — ABNORMAL LOW (ref 80.0–100.0)
Platelets: 216 10*3/uL (ref 150–440)
RBC: 3.86 MIL/uL (ref 3.80–5.20)
RDW: 21.3 % — ABNORMAL HIGH (ref 11.5–14.5)
WBC: 4.1 10*3/uL (ref 3.6–11.0)

## 2016-03-19 LAB — GLUCOSE, CAPILLARY
Glucose-Capillary: 70 mg/dL (ref 65–99)
Glucose-Capillary: 78 mg/dL (ref 65–99)
Glucose-Capillary: 71 mg/dL (ref 65–99)
Glucose-Capillary: 111 mg/dL — ABNORMAL HIGH (ref 65–99)
Glucose-Capillary: 235 mg/dL — ABNORMAL HIGH (ref 65–99)
Glucose-Capillary: 138 mg/dL — ABNORMAL HIGH (ref 65–99)

## 2016-03-19 IMAGING — DX DG CHEST 1V PORT
1 series · 1 of 1 positions shown · non-contrast
Comparison: 10/25/2015.

CLINICAL DATA: Nausea and vomiting since dialysis 3 days ago.
Unable to finish dialysis at that time. Wheezing. Asthma.

EXAM:
PORTABLE CHEST 1 VIEW

[chest ap]
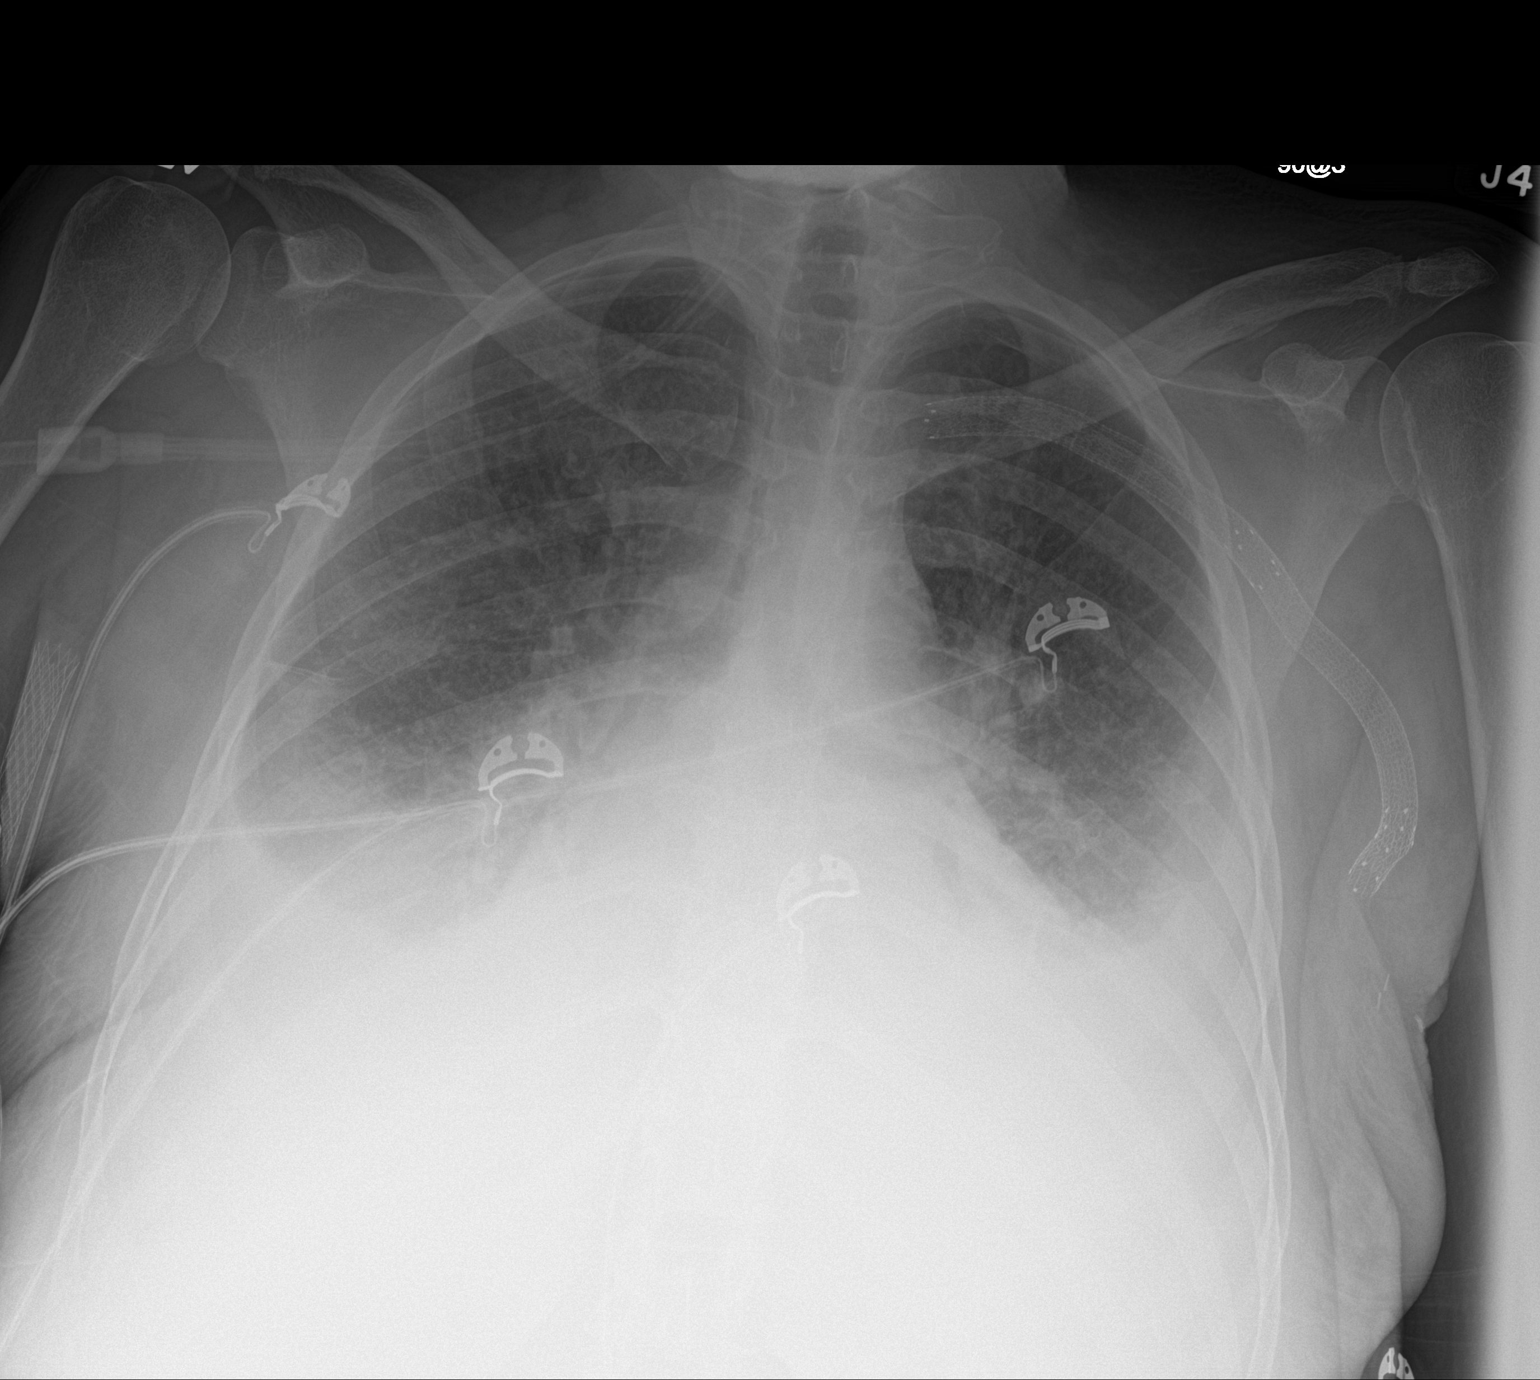

[1 of 1 positions shown; findings below may reference images not displayed]

FINDINGS: Stable left axillary and right arm stents. Mildly progressive
enlargement of the cardiac silhouette with mildly increased
prominence of the pulmonary vasculature and interstitial markings
and increased size of bilateral pleural effusions and bibasilar
atelectasis. Diffuse osteopenia.
IMPRESSION: Mild worsening of changes of congestive heart failure.

## 2016-03-19 IMAGING — CT CT ABD-PELV W/ CM
2 of 5 series · 15 of 46 positions shown, 17 images · IV contrast (omnipaque)
Comparison: 07/22/2015

CLINICAL DATA: Nausea and vomiting for 3 days. Upper abdominal pain
for 1 week. Chronic renal failure on dialysis.

EXAM:
CT ABDOMEN AND PELVIS WITH CONTRAST
TECHNIQUE: Multidetector CT imaging of the abdomen and pelvis was performed
using the standard protocol following bolus administration of
intravenous contrast.
CONTRAST:  100mL OMNIPAQUE IOHEXOL 300 MG/ML  SOLN

[Series 2: routine abd pel with · axial · 0.74mm/px · z∈[-393,+37]mm · 12 of 98 slices shown, 14 images]
[im 6/98  soft-tissue]
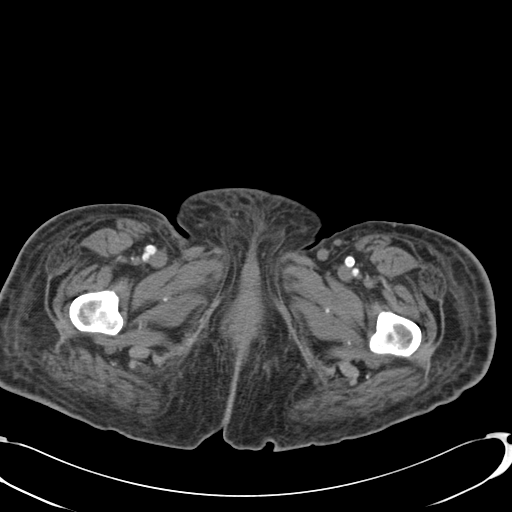
[im 6/98  bone]
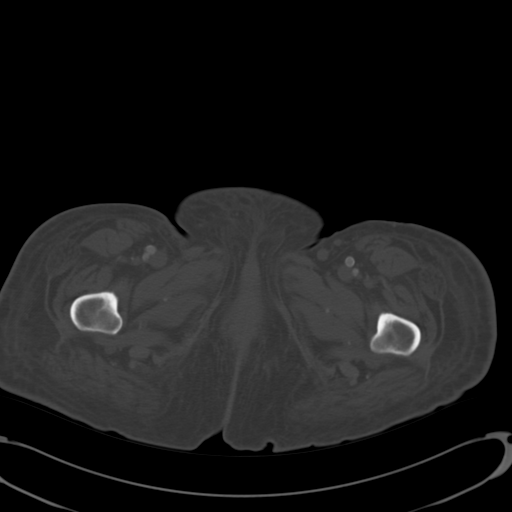
[im 16/98  soft-tissue]
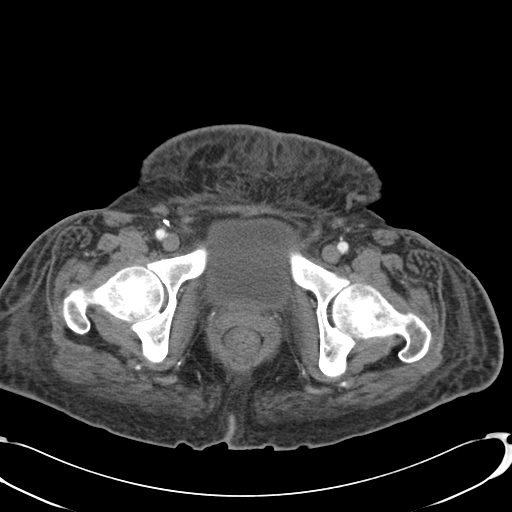
[im 21/98  soft-tissue]
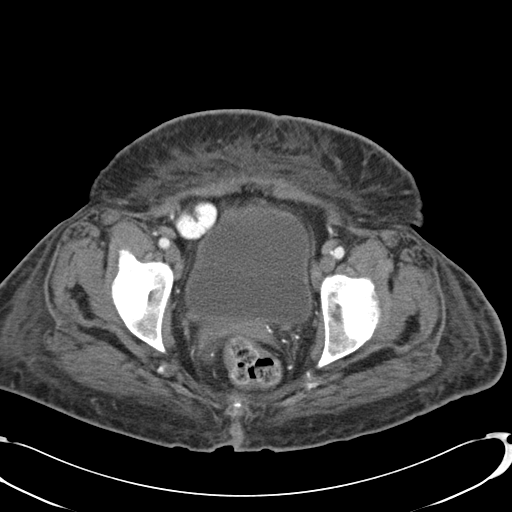
[im 31/98  soft-tissue]
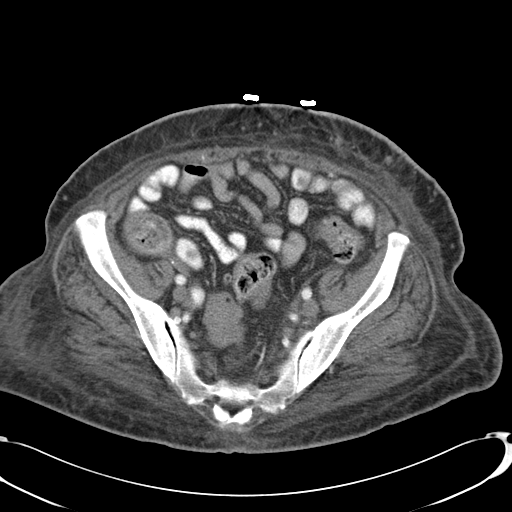
[im 36/98  soft-tissue]
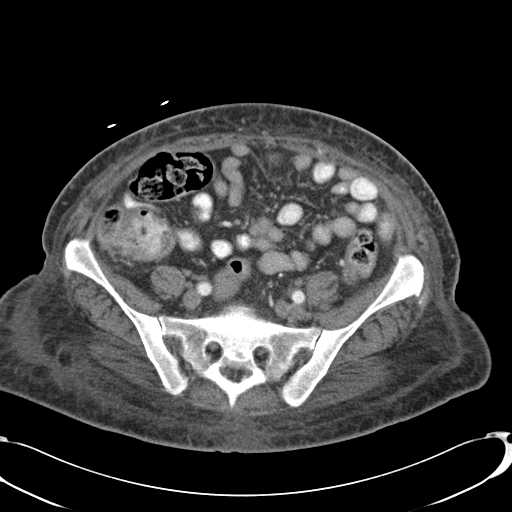
[im 46/98  soft-tissue]
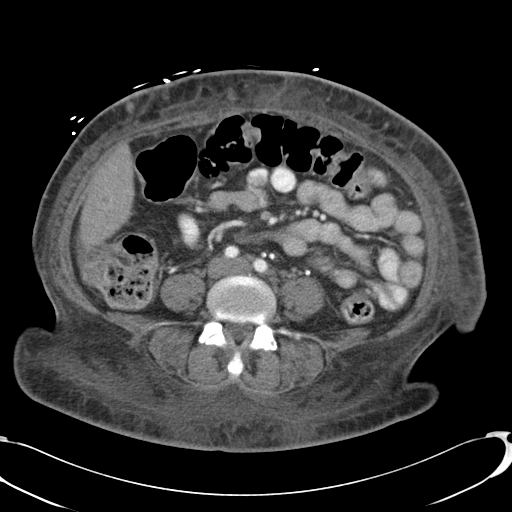
[im 52/98  soft-tissue]
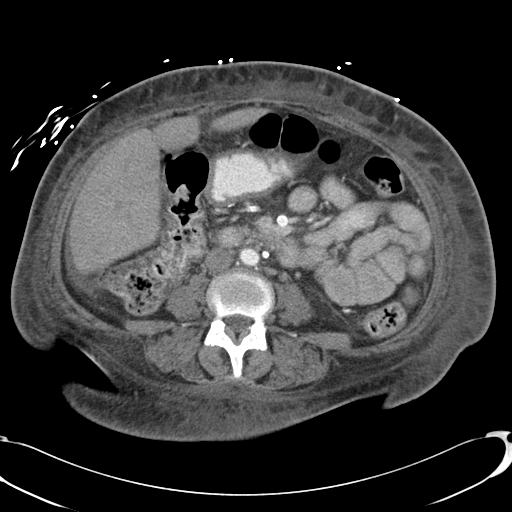
[im 62/98  soft-tissue]
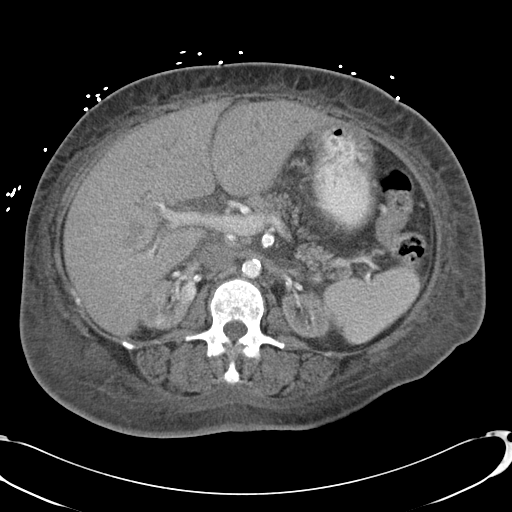
[im 67/98  soft-tissue]
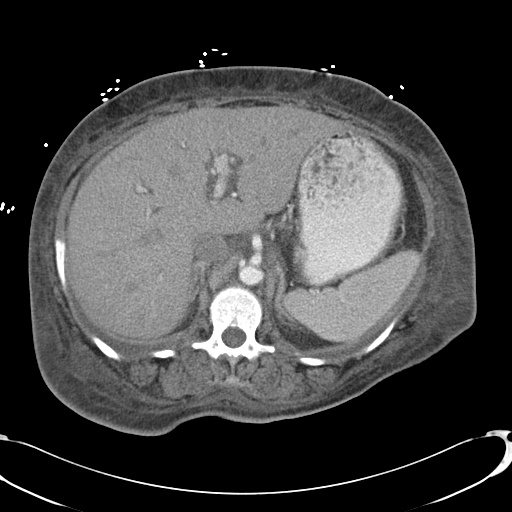
[im 67/98  bone]
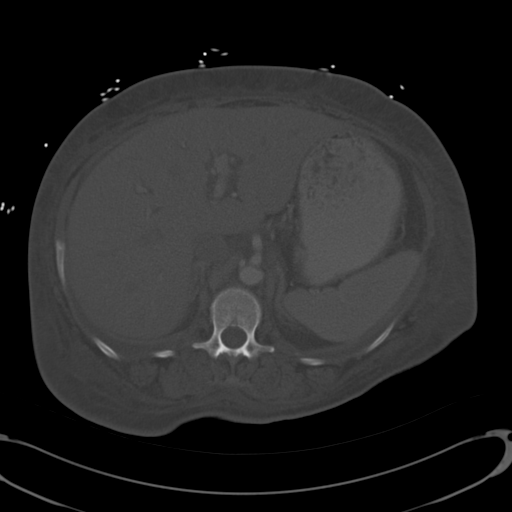
[im 77/98  soft-tissue]
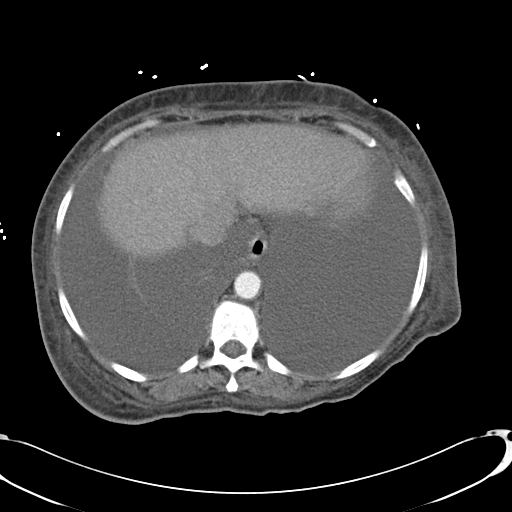
[im 82/98  soft-tissue]
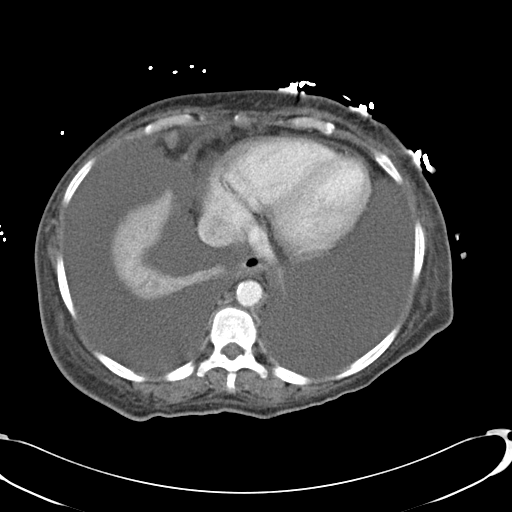
[im 92/98  soft-tissue]
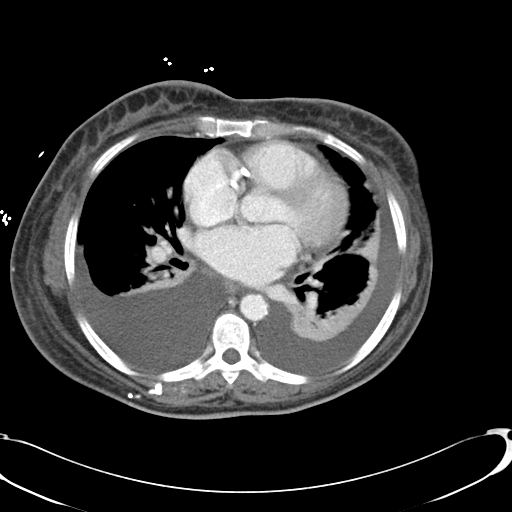

[Series 5: cor routine abd pel with · coronal · 0.71mm/px · 3 of 139 slices shown]
[im 47/139  soft-tissue]
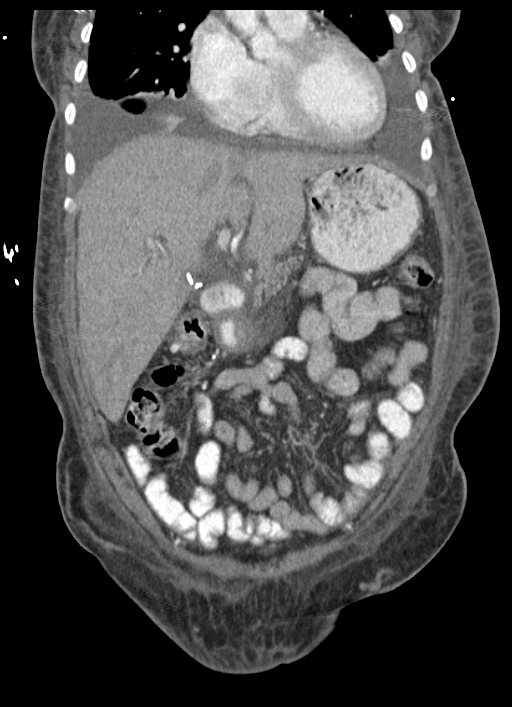
[im 62/139  soft-tissue]
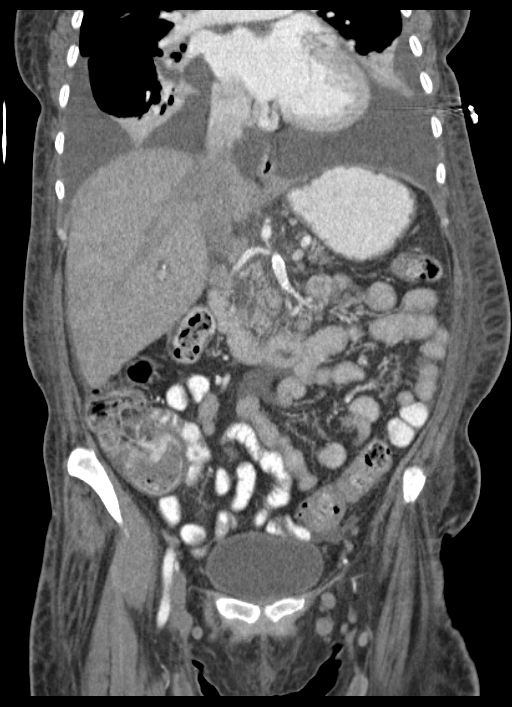
[im 77/139  soft-tissue]
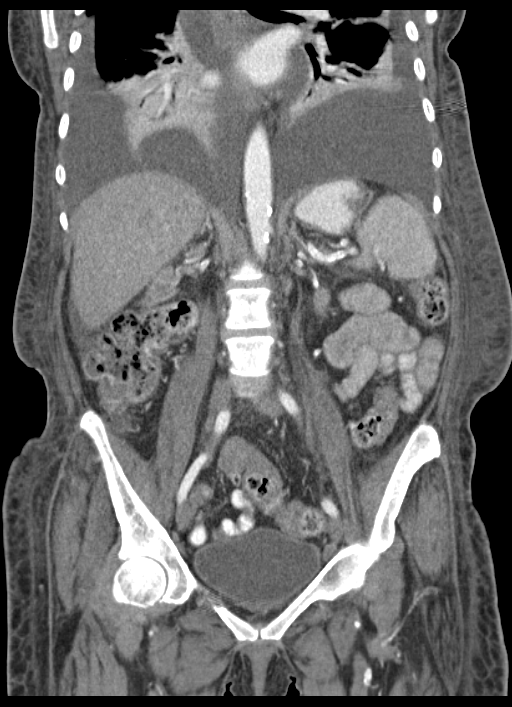

[15 of 46 positions shown; findings below may reference images not displayed]

FINDINGS: Lower chest: Stable mild cardiomegaly. Moderate bilateral pleural
effusions and bibasilar atelectasis show no significant change.

Hepatobiliary: No masses identified. Periportal edema noted. Prior
cholecystectomy noted. No evidence of biliary dilatation.

Pancreas: No mass, inflammatory changes, or other significant
abnormality.

Spleen: Within normal limits in size and appearance.

Adrenals/Urinary Tract: No adrenal masses identified. Bilateral
diffuse renal parenchymal atrophy, consistent with end-stage renal
disease. No evidence of hydronephrosis.

Two enhancing lesions are seen in the upper pole the right kidney
measuring 1.3 cm anteriorly on image 37/series 2 and 11 mm
posteriorly on image 36/series 2. These are suspicious for small
renal cell carcinomas. No evidence hydronephrosis. Unopacified
urinary bladder is unremarkable in appearance.

Stomach/Bowel: No evidence of obstruction, inflammatory process, or
abnormal fluid collections. Sigmoid diverticulosis. No radiographic
evidence of diverticulitis.

Vascular/Lymphatic: No pathologically enlarged lymph nodes. No
evidence of abdominal aortic aneurysm.

Reproductive: Prior hysterectomy noted. Adnexal regions are
unremarkable in appearance.

Other: Diffuse body wall edema again demonstrated.

Musculoskeletal:  No suspicious bone lesions identified.
IMPRESSION: Colonic diverticulosis. No radiographic evidence of diverticulitis
or bowel obstruction.

Stable moderate bilateral pleural effusions and diffuse body wall
edema/anasarca.

Diffuse bilateral renal atrophy, consistent with end-stage renal
disease. Two small enhancing masses in upper pole of right kidney,
suspicious for small renal cell carcinomas. Recommend urology
referral and consider abdomen MRI without contrast for further
evaluation.

## 2016-03-19 MED ORDER — PANTOPRAZOLE SODIUM 40 MG PO TBEC: 40.0000 mg | DELAYED_RELEASE_TABLET | Freq: Two times a day (BID) | ORAL | Status: DC

## 2016-03-19 MED ORDER — VANCOMYCIN 50 MG/ML ORAL SOLUTION: 125.0000 mg | Freq: Four times a day (QID) | ORAL | Status: DC

## 2016-03-19 MED ORDER — METHYLPREDNISOLONE SODIUM SUCC 125 MG IJ SOLR
60.0000 mg | INTRAMUSCULAR | Status: AC
  Administered 2016-03-19: 60 mg via INTRAVENOUS
  Filled 2016-03-19: qty 2

## 2016-03-19 MED ORDER — METOCLOPRAMIDE HCL 5 MG PO TABS: 5.0000 mg | ORAL_TABLET | Freq: Three times a day (TID) | ORAL | Status: DC

## 2016-03-19 MED ORDER — IPRATROPIUM-ALBUTEROL 0.5-2.5 (3) MG/3ML IN SOLN
3.0000 mL | RESPIRATORY_TRACT | Status: AC
  Administered 2016-03-19: 3 mL via RESPIRATORY_TRACT
  Filled 2016-03-19: qty 3

## 2016-03-19 MED ORDER — HYDROCODONE-ACETAMINOPHEN 5-325 MG PO TABS: 1.0000 | ORAL_TABLET | Freq: Four times a day (QID) | ORAL | Status: DC | PRN

## 2016-03-19 NOTE — Progress Notes (Signed)
Patient complains with nausea, that "it has not let up", gave prn nausea medication, awaiting effectiveness prior to pain medication. Consulting with MD.

## 2016-03-19 NOTE — Progress Notes (Signed)
Pre-hd tx 

## 2016-03-19 NOTE — Progress Notes (Signed)
Pt tx out by nursing staff and son. Discharge and scripts given

## 2016-03-19 NOTE — Consult Note (Signed)
[facility] Consult   Reason for Consult:  Follow-up patient with history of depression and anxiety. Referring Physician:  Mody Patient Identification: [name] MRN:  [id number] Principal Diagnosis: Major depressive disorder, recurrent episode, moderate ([place]) Diagnosis:   Patient Active Problem List   Diagnosis Date Noted  . Blood in stool [K92.1]   . Intractable cyclical vomiting with nausea [G43.A1]   . Reflux esophagitis [K21.0]   . Gastritis [K29.70]   . Generalized abdominal pain [R10.84]   . Uncontrollable vomiting [R11.10]   . Major depressive disorder, recurrent episode, moderate ([place]) [F33.1] 03/15/2016  . Adjustment disorder with mixed anxiety and depressed mood [F43.23] 03/15/2016  . Malnutrition of moderate degree [E44.0] 12/01/2015  . Renal mass [N28.89]   . Dyspnea [R06.00]   . Acute renal failure ([place]) [N17.9]   . Respiratory failure ([place]) [J96.90]   . High temperature [R50.9] 11/14/2015  . Pulmonary edema [J81.1]   . Encounter for central line placement [Z45.2]   . Encounter for orogastric (OG) tube placement [Z46.59]   . Nausea [R11.0] 11/12/2015  . Hyperkalemia [E87.5] 10/03/2015  . C. difficile diarrhea [A04.7] 07/22/2015  . Pneumonia [J18.9] 05/21/2015  . Hypoglycemia [E16.2] 04/24/2015  . Unresponsiveness [R40.4] 04/24/2015  . Bradycardia [R00.1] 04/24/2015  . Hypothermia [T68.XXXA] 04/24/2015  . Acute respiratory failure ([place]) [J96.00] 04/24/2015  . Acute diastolic CHF (congestive heart failure) ([place]) [I50.31] 04/05/2015  . Diabetic gastroparesis ([place]) [E11.43, K31.84] 04/05/2015  . Hypokalemia [E87.6] 04/05/2015  . Generalized weakness [R53.1] 04/05/2015  . Acute pulmonary edema ([place]) [J81.0] 04/03/2015  . Nausea and vomiting [R11.2] 04/03/2015  . Hypoglycemia associated with diabetes ([place]) [E11.649] 04/03/2015  . Anemia of chronic disease [D63.8] 04/03/2015  . Secondary hyperparathyroidism ([place]) [N25.81] 04/03/2015  . Pressure  ulcer [L89.90] 04/02/2015  . Acute respiratory failure with hypoxia ([place]) [J96.01] 04/01/2015  . Adjustment disorder with anxiety [F43.22] 03/14/2015  . Somatic symptom disorder, mild [F45.8] 03/08/2015  . Coronary artery disease involving native coronary artery of native heart without angina pectoris [I25.10]   . Nausea & vomiting [R11.2] 03/06/2015  . Abdominal pain [R10.9] 03/06/2015  . DM (diabetes mellitus) ([place]) [E11.9] 03/06/2015  . HTN (hypertension) [I10] 03/06/2015  . Gastroparesis [K31.84] 02/24/2015  . Pleural effusion [J90] 02/19/2015  . HCAP (healthcare-associated pneumonia) [J18.9] 02/19/2015  . End-stage renal disease on hemodialysis ([place]) [N18.6, Z99.2] 02/19/2015    Total Time spent with patient: 20 minutes  Subjective:   [name] is a 55 y.o. female patient admitted with "I had been sick".  HPI:   Follow-up for Wednesday the 28th. Patient has no new complaints. She is pleased that her strength is improving. She was able to ambulate independently today. Mood is feeling good. Optimistic and upbeat. Affect smiling and appropriate.  Past Psychiatric History: Long history of both anxiety and depression in the past. No history of suicide attempts does well with chronic citalopram  Risk to Self: Is patient at risk for suicide?: No Risk to Others:   Prior Inpatient Therapy:   Prior Outpatient Therapy:    Past Medical History:  Past Medical History  Diagnosis Date  . Asthma   . Collagen vascular disease ([place])   . Diabetes mellitus without complication ([place])   . Hypertension   . Coronary artery disease     a. cath 2013: stenting to RCA (report not available); b. cath 2014: LM nl, pLAD 40%, mLAD nl, ost LCx 40%, mid LCx nl, pRCA 30% @ site of prior stent, mRCA 50%  .  Diabetic neuropathy ([place])   . ESRD (end stage renal disease) on dialysis ([place])     M-W-F  . GERD (gastroesophageal reflux disease)   . Hx of pancreatitis 2015  . Myocardial infarction ([place])   .  Pneumonia   . Mitral regurgitation     a. echo 10/2013: EF 62%, noWMA, mildly dilated LA, mild to mod MR/TR, GR1DD  . COPD (chronic obstructive pulmonary disease) ([place])   . chronic diastolic CHF [id number]  . dialysis 2006  . Depression   . Anxiety   . Arthritis   . Headache   . Anemia   . Renal insufficiency     Past Surgical History  Procedure Laterality Date  . Cholecystectomy    . Appendectomy    . Abdominal hysterectomy    . Dialysis fistula creation Left     upper arm  . Esophagogastroduodenoscopy N/A 03/08/2015    Procedure: ESOPHAGOGASTRODUODENOSCOPY (EGD);  Surgeon: Manya Silvas, MD;  Location: [facility] ENDOSCOPY;  Service: Endoscopy;  Laterality: N/A;  . Cardiac catheterization Left 07/26/2015    Procedure: Left Heart Cath and Coronary Angiography;  Surgeon: Dionisio David, MD;  Location: [facility] CV LAB;  Service: Cardiovascular;  Laterality: Left;  Marland Kitchen Eye surgery    . Fecal transplant N/A 08/23/2015    Procedure: FECAL TRANSPLANT;  Surgeon: Manya Silvas, MD;  Location: [facility] ENDOSCOPY;  Service: Endoscopy;  Laterality: N/A;  . Peripheral vascular catheterization N/A 12/20/2015    Procedure: Thrombectomy of dialysis access versus permcath placement;  Surgeon: Algernon Huxley, MD;  Location: [place] [facility] CV LAB;  Service: Cardiovascular;  Laterality: N/A;  . Peripheral vascular catheterization N/A 12/20/2015    Procedure: A/V Shunt Intervention;  Surgeon: Algernon Huxley, MD;  Location: [facility] CV LAB;  Service: Cardiovascular;  Laterality: N/A;  . Peripheral vascular catheterization N/A 12/20/2015    Procedure: A/V Shuntogram/Fistulagram;  Surgeon: Algernon Huxley, MD;  Location: [place] CV LAB;  Service: Cardiovascular;  Laterality: N/A;  . Peripheral vascular catheterization N/A 01/02/2016    Procedure: A/V Shuntogram/Fistulagram;  Surgeon: Algernon Huxley, MD;  Location: [place] [facility] CV LAB;  Service: Cardiovascular;  Laterality: N/A;  . Peripheral vascular  catheterization N/A 01/02/2016    Procedure: A/V Shunt Intervention;  Surgeon: Algernon Huxley, MD;  Location: [place] [facility] CV LAB;  Service: Cardiovascular;  Laterality: N/A;  . Esophagogastroduodenoscopy (egd) with propofol N/A 03/18/2016    Procedure: ESOPHAGOGASTRODUODENOSCOPY (EGD) WITH PROPOFOL;  Surgeon: [name], MD;  Location: [facility] ENDOSCOPY;  Service: Endoscopy;  Laterality: N/A;   Family History:  Family History  Problem Relation Age of Onset  . Kidney disease Mother   . Diabetes Mother    Family Psychiatric  History: Positive for anxiety Social History:  History  Alcohol Use No     History  Drug Use No    Social History   Social History  . Marital Status: Divorced    Spouse Name: N/A  . Number of Children: N/A  . Years of Education: N/A   Social History Main Topics  . Smoking status: Former Smoker -- 0.50 packs/day    Types: Cigarettes    Quit date: 02/13/2015  . Smokeless tobacco: Never Used  . Alcohol Use: No  . Drug Use: No  . Sexual Activity: Not Asked   Other Topics Concern  . None   Social History Narrative   Additional Social History:    Allergies:   Allergies  Allergen Reactions  . Compazine [Prochlorperazine Edisylate] Anaphylaxis  and Nausea And Vomiting    23 Jul - patient relates that she takes promethazine frequently with no problems.  . Ace Inhibitors Swelling  . Ativan [Lorazepam] Other (See Comments)    Reaction:  Hallucinations and headaches  . Codeine Nausea And Vomiting  . Dilaudid [Hydromorphone Hcl] Other (See Comments)    Delirium  . Gabapentin Other (See Comments)    Reaction:  Unknown   . Losartan Other (See Comments)    Reaction:  Unknown   . Ondansetron Other (See Comments)    Reaction:  Unknown   . Prochlorperazine Other (See Comments)    Reaction:  Unknown   . Scopolamine Other (See Comments)    Reaction:  Unknown   . Zofran [Ondansetron Hcl] Other (See Comments)    Reaction:  hallucinations   . Oxycodone Anxiety   . Tape Rash    Labs:  Results for orders placed or performed during the hospital encounter of 03/15/16 (from the past 48 hour(s))  Glucose, capillary     Status: None   Collection Time: 03/17/16  9:04 PM  Result Value Ref Range   Glucose-Capillary 71 65 - 99 mg/dL   Comment 1 Notify RN   Glucose, capillary     Status: None   Collection Time: 03/17/16 10:38 PM  Result Value Ref Range   Glucose-Capillary 84 65 - 99 mg/dL   Comment 1 Notify RN   Glucose, capillary     Status: Abnormal   Collection Time: 03/18/16  7:28 AM  Result Value Ref Range   Glucose-Capillary 110 (H) 65 - 99 mg/dL  MRSA PCR Screening     Status: None   Collection Time: 03/18/16 10:46 AM  Result Value Ref Range   MRSA by PCR NEGATIVE NEGATIVE    Comment:        The GeneXpert MRSA Assay (FDA approved for NASAL specimens only), is one component of a comprehensive MRSA colonization surveillance program. It is not intended to diagnose MRSA infection nor to guide or monitor treatment for MRSA infections.   Glucose, capillary     Status: None   Collection Time: 03/18/16 11:27 AM  Result Value Ref Range   Glucose-Capillary 67 65 - 99 mg/dL  Glucose, capillary     Status: Abnormal   Collection Time: 03/18/16 12:08 PM  Result Value Ref Range   Glucose-Capillary 111 (H) 65 - 99 mg/dL  Glucose, capillary     Status: None   Collection Time: 03/18/16  5:01 PM  Result Value Ref Range   Glucose-Capillary 68 65 - 99 mg/dL  Glucose, capillary     Status: None   Collection Time: 03/18/16  6:38 PM  Result Value Ref Range   Glucose-Capillary 78 65 - 99 mg/dL  Glucose, capillary     Status: None   Collection Time: 03/18/16  9:10 PM  Result Value Ref Range   Glucose-Capillary 90 65 - 99 mg/dL   Comment 1 Notify RN   Glucose, capillary     Status: None   Collection Time: 03/19/16  1:52 AM  Result Value Ref Range   Glucose-Capillary 70 65 - 99 mg/dL   Comment 1 Notify RN   Glucose, capillary     Status: None    Collection Time: 03/19/16  4:17 AM  Result Value Ref Range   Glucose-Capillary 78 65 - 99 mg/dL   Comment 1 Notify RN   Glucose, capillary     Status: None   Collection Time: 03/19/16  7:48 AM  Result Value Ref Range   Glucose-Capillary 71 65 - 99 mg/dL   Comment 1 Notify RN   Glucose, capillary     Status: Abnormal   Collection Time: 03/19/16 11:11 AM  Result Value Ref Range   Glucose-Capillary 111 (H) 65 - 99 mg/dL  Glucose, capillary     Status: Abnormal   Collection Time: 03/19/16  4:20 PM  Result Value Ref Range   Glucose-Capillary 235 (H) 65 - 99 mg/dL   Comment 1 Notify RN     Current Facility-Administered Medications  Medication Dose Route Frequency Provider Last Rate Last Dose  . acetaminophen (TYLENOL) tablet 650 mg  650 mg Oral Q6H PRN Bettey Costa, MD   650 mg at 03/18/16 2240   Or  . acetaminophen (TYLENOL) suppository 650 mg  650 mg Rectal Q6H PRN Bettey Costa, MD      . acidophilus (RISAQUAD) capsule 1 capsule  1 capsule Oral Daily Sital Mody, MD   1 capsule at 03/19/16 1112  . albuterol (PROVENTIL) (2.5 MG/3ML) 0.083% nebulizer solution 2.5 mg  2.5 mg Nebulization Q4H PRN Sital Mody, MD      . amLODipine (NORVASC) tablet 10 mg  10 mg Oral Daily Bettey Costa, MD   10 mg at 03/19/16 1111  . aspirin EC tablet 81 mg  81 mg Oral Oretha Ellis, MD   81 mg at 03/19/16 1113  . atorvastatin (LIPITOR) tablet 20 mg  20 mg Oral QPM Bettey Costa, MD   20 mg at 03/19/16 1716  . bisacodyl (DULCOLAX) EC tablet 5 mg  5 mg Oral Daily PRN Bettey Costa, MD      . bisacodyl (DULCOLAX) EC tablet 5 mg  5 mg Oral Daily PRN Bettey Costa, MD      . cholecalciferol (VITAMIN D) tablet 1,000 Units  1,000 Units Oral Daily Bettey Costa, MD   1,000 Units at 03/19/16 1111  . citalopram (CELEXA) tablet 20 mg  20 mg Oral Oretha Ellis, MD   20 mg at 03/19/16 1114  . clopidogrel (PLAVIX) tablet 75 mg  75 mg Oral Daily Bettey Costa, MD   75 mg at 03/19/16 1115  . dextrose 50 % solution 25 mL  25 mL Intravenous  Once Gladstone Lighter, MD      . haloperidol (HALDOL) tablet 1 mg  1 mg Oral BID Jolanta B Pucilowska, MD   1 mg at 03/19/16 1113  . heparin injection 5,000 Units  5,000 Units Subcutaneous Q8H Bettey Costa, MD   5,000 Units at 03/19/16 0537  . hydrALAZINE (APRESOLINE) tablet 25 mg  25 mg Oral TID Bettey Costa, MD   25 mg at 03/19/16 1716  . HYDROmorphone (DILAUDID) injection 0.5 mg  0.5 mg Intravenous Q4H PRN Bettey Costa, MD   0.5 mg at 03/18/16 2340  . insulin aspart (novoLOG) injection 0-5 Units  0-5 Units Subcutaneous QHS Bettey Costa, MD   0 Units at 03/15/16 2106  . insulin aspart (novoLOG) injection 0-9 Units  0-9 Units Subcutaneous TID WC Bettey Costa, MD   3 Units at 03/19/16 1715  . iopamidol (ISOVUE-300) 61 % injection 100 mL  100 mL Intravenous Once PRN Bettey Costa, MD      . lipase/protease/amylase (CREON) capsule 12,000 Units  12,000 Units Oral With snacks Bettey Costa, MD   12,000 Units at 03/19/16 1522  . lipase/protease/amylase (CREON) capsule 24,000 Units  24,000 Units Oral Q lunch Bettey Costa, MD   24,000 Units at 03/19/16 1118  . lipase/protease/amylase (CREON) capsule  36,000 Units  36,000 Units Oral BID AC & HS Bettey Costa, MD   36,000 Units at 03/19/16 1112  . loratadine (CLARITIN) tablet 10 mg  10 mg Oral Daily Bettey Costa, MD   10 mg at 03/19/16 1112  . metoCLOPramide (REGLAN) tablet 5 mg  5 mg Oral TID AC & HS Lavonia Dana, MD   5 mg at 03/19/16 1716  . multivitamin (RENA-VIT) tablet 1 tablet  1 tablet Oral Daily Bettey Costa, MD   1 tablet at 03/19/16 1116  . nitroGLYCERIN (NITROSTAT) SL tablet 0.4 mg  0.4 mg Sublingual Q5 Min x 3 PRN Sital Mody, MD      . omega-3 acid ethyl esters (LOVAZA) capsule 1 g  1 g Oral Daily Bettey Costa, MD   1 g at 03/19/16 1114  . pantoprazole (PROTONIX) EC tablet 40 mg  40 mg Oral BID AC Bettey Costa, MD   40 mg at 03/19/16 1716  . polyethylene glycol (MIRALAX / GLYCOLAX) packet 17 g  17 g Oral Daily PRN Bettey Costa, MD      . promethazine (PHENERGAN) injection  25 mg  25 mg Intravenous Q6H PRN Harrie Foreman, MD   25 mg at 03/19/16 1523  . ranolazine (RANEXA) 12 hr tablet 1,000 mg  1,000 mg Oral BID Bettey Costa, MD   1,000 mg at 03/19/16 1119  . sevelamer carbonate (RENVELA) tablet 800 mg  800 mg Oral TID WC Bettey Costa, MD   800 mg at 03/19/16 1716  . sodium phosphate (FLEET) 7-19 GM/118ML enema 1 enema  1 enema Rectal Once PRN Bettey Costa, MD      . vancomycin (VANCOCIN) 50 mg/mL oral solution 125 mg  125 mg Oral Q6H Crystal G Scarpena, RPH   125 mg at 03/19/16 1721  . vitamin B-12 (CYANOCOBALAMIN) tablet 1,000 mcg  1,000 mcg Oral Daily Bettey Costa, MD   1,000 mcg at 03/19/16 1112    Musculoskeletal: Strength & Muscle Tone: decreased Gait & Station: unsteady Patient leans: N/A  Psychiatric Specialty Exam: Physical Exam  Nursing note and vitals reviewed. Constitutional: She appears well-developed and well-nourished.  HENT:  Head: Normocephalic and atraumatic.  Eyes: Conjunctivae are normal. Pupils are equal, round, and reactive to light.  Neck: Normal range of motion.  Cardiovascular: Normal heart sounds.   Respiratory: Effort normal.  GI: Soft.  Musculoskeletal: Normal range of motion.  Neurological: She is alert.  Skin: Skin is warm and dry.  Psychiatric: She has a normal mood and affect. Her behavior is normal. Judgment and thought content normal.    Review of Systems  Constitutional: Negative.   HENT: Negative.   Eyes: Negative.   Respiratory: Negative.   Cardiovascular: Negative.   Gastrointestinal: Negative.   Musculoskeletal: Negative.   Skin: Negative.   Neurological: Negative.   Psychiatric/Behavioral: Negative for depression, suicidal ideas, hallucinations, memory loss and substance abuse. The patient is not nervous/anxious and does not have insomnia.     Blood pressure 158/62, pulse 80, temperature 97.1 F (36.2 C), temperature source Oral, resp. rate 18, height 5\' 5"  (1.651 m), weight 77.61 kg (171 lb 1.6 oz), SpO2 99  %.Body mass index is 28.47 kg/(m^2).  General Appearance: Disheveled  Eye Contact:  Good  Speech:  Slow  Volume:  Decreased  Mood:  Dysphoric  Affect:  Constricted  Thought Process:  Goal Directed  Orientation:  Full (Time, Place, and Person)  Thought Content:  Logical  Suicidal Thoughts:  No  Homicidal Thoughts:  No  Memory:  Immediate;   Fair Recent;   Fair Remote;   Fair  Judgement:  Fair  Insight:  Fair  Psychomotor Activity:  Normal  Concentration:  Concentration: Fair  Recall:  [employer] of Knowledge:  Fair  Language:  Fair  Akathisia:  No  Handed:  Right  AIMS (if indicated):     Assets:  Communication Skills Desire for Improvement Housing Resilience  ADL's:  Intact  Cognition:  WNL  Sleep:        Treatment Plan Summary: Plan Supportive counseling. No change to medicine. Mood continues to improve daily as her health improves. She is looking forward to going home. No sign of any dangerousness.  Disposition: Patient does not meet criteria for psychiatric inpatient admission. Supportive therapy provided about ongoing stressors.  Alethia Berthold, MD 03/19/2016 5:41 PM  .

## 2016-03-19 NOTE — Progress Notes (Signed)
Spoke with MD regarding discharge. At this time patient family ok pick up patient, currently on dialysis, will be removed at 9:30pm. Will alert oncoming nurse if any any changes may occur.

## 2016-03-19 NOTE — Progress Notes (Signed)
Post hd tx 

## 2016-03-19 NOTE — Care Management (Signed)
Home health orders have been faxed to [facility] with [facility] [phone]. No further RNCM needs.

## 2016-03-19 NOTE — Progress Notes (Signed)
[facility] [place] NAME: [name]    MR#:  [id number]  DATE OF BIRTH:  1960-10-24  SUBJECTIVE:  CHIEF COMPLAINT:   Chief Complaint  Patient presents with  . Emesis   - for EGD today for dark stools, no further vomiting - Blood sugar borderline low, on D5 as NPO today - for dialysis tomorrow per schedule - No hallucinations today  REVIEW OF SYSTEMS:  Review of Systems  Constitutional: Positive for malaise/fatigue. Negative for fever and chills.  HENT: Negative for ear discharge, ear pain and nosebleeds.   Eyes: Negative for blurred vision and double vision.  Respiratory: Negative for cough, shortness of breath and wheezing.   Cardiovascular: Negative for chest pain, palpitations and leg swelling.  Gastrointestinal: Positive for nausea. Negative for vomiting, abdominal pain, diarrhea and constipation.  Genitourinary: Negative for dysuria and urgency.  Musculoskeletal: Negative for myalgias.  Neurological: Negative for dizziness, speech change, focal weakness, seizures and headaches.  Psychiatric/Behavioral: Positive for hallucinations. Negative for depression. The patient is nervous/anxious.     DRUG ALLERGIES:   Allergies  Allergen Reactions  . Compazine [Prochlorperazine Edisylate] Anaphylaxis and Nausea And Vomiting    23 Jul - patient relates that she takes promethazine frequently with no problems.  . Ace Inhibitors Swelling  . Ativan [Lorazepam] Other (See Comments)    Reaction:  Hallucinations and headaches  . Codeine Nausea And Vomiting  . Dilaudid [Hydromorphone Hcl] Other (See Comments)    Delirium  . Gabapentin Other (See Comments)    Reaction:  Unknown   . Losartan Other (See Comments)    Reaction:  Unknown   . Ondansetron Other (See Comments)    Reaction:  Unknown   . Prochlorperazine Other (See Comments)    Reaction:  Unknown   . Scopolamine Other (See Comments)    Reaction:  Unknown   . Zofran  [Ondansetron Hcl] Other (See Comments)    Reaction:  hallucinations   . Oxycodone Anxiety  . Tape Rash    VITALS:  Blood pressure 160/64, pulse 79, temperature 96.8 F (36 C), temperature source Oral, resp. rate 18, height 5\' 5"  (1.651 m), weight 77.61 kg (171 lb 1.6 oz), SpO2 100 %.  PHYSICAL EXAMINATION:  Physical Exam  GENERAL:  55 y.o.-year-old patient lying in the bed with no acute distress.  EYES: Pupils equal, round, reactive to light and accommodation. No scleral icterus. Extraocular muscles intact.  HEENT: Head atraumatic, normocephalic. Oropharynx and nasopharynx clear. Dry mucous membranes NECK:  Supple, no jugular venous distention. No thyroid enlargement, no tenderness.  LUNGS: Normal breath sounds bilaterally, no wheezing, rales,rhonchi or crepitation. No use of accessory muscles of respiration.  CARDIOVASCULAR: S1, S2 normal. No murmurs, rubs, or gallops.  ABDOMEN: Soft, nontender, nondistended. Bowel sounds present. No organomegaly or mass.  EXTREMITIES: No pedal edema, cyanosis, or clubbing. Left arm AV fistula NEUROLOGIC: Cranial nerves II through XII are intact. Muscle strength 5/5 in all extremities. Sensation intact. Gait not checked.  PSYCHIATRIC: The patient is alert and oriented x 3.  SKIN: No obvious rash, lesion, or ulcer.    LABORATORY PANEL:   CBC  Recent Labs Lab 03/16/16 0404  WBC 5.1  HGB 10.0*  HCT 31.5*  PLT 194   ------------------------------------------------------------------------------------------------------------------  Chemistries   Recent Labs Lab 03/15/16 0742  NA 136  K 3.9  CL 97*  CO2 29  GLUCOSE 124*  BUN 26*  CREATININE 5.63*  CALCIUM 8.4*  AST 27  ALT  15  ALKPHOS 106  BILITOT 0.9   ------------------------------------------------------------------------------------------------------------------  Cardiac Enzymes  Recent Labs Lab 03/14/16 0810  TROPONINI <0.03    ------------------------------------------------------------------------------------------------------------------  RADIOLOGY:  No results found.  EKG:   Orders placed or performed during the hospital encounter of 03/15/16  . ED EKG  . ED EKG    ASSESSMENT AND PLAN:   55 year old female with end-stage renal disease on Monday Wednesday Friday hemodialysis, coronary artery disease, chronic pancreatitis with chronic nausea and abdominal pain, pressure and anxiety presents to the hospital secondary to worsening abdominal pain and dark stools.  #1 abdominal pain-acute on chronic pancreatitis. Has chronic abdominal pain. Lipase is normal -Pain medications as needed. -Appreciate GI consult. Advance diet as tolerated. -Continue pancreatic supplements  #2 Melena-stable hemoglobin. S/p EGD showing- reflux esophagitis. - Patient on aspirin and Plavix. -On PPI  #3 hypertension-on Norvasc and hydralazine  #4 depression and anxiety with hallucinations- much improved -appreciate psych consult.  -on Haldol twice a day. Continue Celexa  #5 end-stage renal disease on hemodialysis-on Monday Wednesday Friday schedule. Dialysis today per schedule.  #6 COPD- with mild wheezing today - 1 dose of solumedrol and nebs now - cont inhalers and monitor - on 2L o2 here- wean as tolerated  #7 DVT prophylaxis-on subcutaneous heparin  Physical therapy today.    All the records are reviewed and case discussed with Care Management/Social Workerr. Management plans discussed with the patient, family and they are in agreement.  CODE STATUS: Full Code  TOTAL TIME TAKING CARE OF THIS PATIENT: 36 minutes.   POSSIBLE D/C TODAY, DEPENDING ON CLINICAL CONDITION.   Gladstone Lighter M.D on 03/19/2016 at 8:07 AM  Between 7am to 6pm - Pager - [phone]  After 6pm go to [email or web address] - password [email or web address]  Office  [phone]  CC: Primary care physician; Ellamae Sia, MD

## 2016-03-19 NOTE — Progress Notes (Signed)
Pt tolerated hemodialysis well. VSS stable. Blood sugar WNL. Pt eating dinner tray. Son at bedside. Discharge instructions went over with patient.

## 2016-03-19 NOTE — Evaluation (Signed)
Physical Therapy Evaluation Patient Details Name: [name] MRN: [id number] DOB: 1960/10/29 Today's Date: 03/19/2016   History of Present Illness  Pt is a 55 y.o. female presenting with nausea, abdominal pain, emesis, and dark stools.  Pt admitted with hypoglycemia, chronic pancreatitis, and found to be (+) c-diff.  Pt s/p EGD 03/18/16 (gastritis).  Notes also mention auditory and visual hallucinations.  PMH includes ESRD on HD MWF, MI, COPD, CHF, DM, htn, pancreatitis, neuropathy.  Clinical Impression  Prior to admission, pt was modified independent ambulating with RW (used manual w/c for dialysis).  Pt lives with her son in home with steps to enter.  Currently pt is min assist supine to sit and CGA with transfers and short ambulation with RW.  Pt would benefit from skilled PT to address noted impairments and functional limitations.  Recommend pt discharge to home with support of family, use of RW, and HHPT when medically appropriate.     Follow Up Recommendations Home health PT (assist for stairs)    Equipment Recommendations   (pt already owns RW (and manual w/c for dialysis))    Recommendations for Other Services       Precautions / Restrictions Precautions Precautions: Fall Precaution Comments: L UE AV graft Restrictions Weight Bearing Restrictions: No      Mobility  Bed Mobility Overal bed mobility: Needs Assistance Bed Mobility: Supine to Sit     Supine to sit: Min assist;HOB elevated     General bed mobility comments: assist for trunk  Transfers Overall transfer level: Needs assistance Equipment used: Rolling walker (2 wheeled) Transfers: Sit to/from Stand Sit to Stand: Min guard         General transfer comment: steady and strong stand without loss of balance from bed (x2 trials) and from chair (x10 repetitions for strengthening)  Ambulation/Gait Ambulation/Gait assistance: Min guard Ambulation Distance (Feet): 30 Feet Assistive device: Rolling walker (2  wheeled) Gait Pattern/deviations: Step-through pattern Gait velocity: mildly decreased   General Gait Details: steady without loss of balance  Stairs            Wheelchair Mobility    Modified Rankin (Stroke Patients Only)       Balance Overall balance assessment: Needs assistance Sitting-balance support: Bilateral upper extremity supported;Feet supported Sitting balance-Leahy Scale: Good     Standing balance support: Bilateral upper extremity supported (on RW) Standing balance-Leahy Scale: Good                               Pertinent Vitals/Pain Pain Assessment: 0-10 Pain Score: 4  Pain Location: abdominal pain Pain Descriptors / Indicators: Tender Pain Intervention(s): Limited activity within patient's tolerance;Monitored during session;Repositioned  Pt reporting that she did not use O2 during the day at home (just at night) and pt wanted to try to walk without supplemental O2.  O2 95% on 2 L/min via nasal cannula but with short ambulation O2 decreased to 80% on RA so pt placed back on 2 L/min O2 via nasal cannula and O2 quickly returned to 94% (nursing notified).  Pt's O2 95% on 2 L/min via nasal cannula end of session.    Home Living Family/patient expects to be discharged to:: Private residence Living Arrangements: Children (Pt's son) Available Help at Discharge: Family;Available PRN/intermittently Type of Home: House Home Access: Stairs to enter Entrance Stairs-Rails: Right;Left Entrance Stairs-Number of Steps: 4 Home Layout: Two level;Able to live on main level with bedroom/bathroom Home Equipment: Wheelchair -  manual;Walker - 2 wheels;Cane - single point;Bedside commode      Prior Function Level of Independence: Needs assistance   Gait / Transfers Assistance Needed: Ambulates with RW.     Comments: Festus Aloe (via manual w/c) to HD 3x/week     Hand Dominance        Extremity/Trunk Assessment   Upper Extremity Assessment: Overall WFL  for tasks assessed           Lower Extremity Assessment: Overall WFL for tasks assessed         Communication   Communication: HOH  Cognition Arousal/Alertness: Awake/alert Behavior During Therapy: WFL for tasks assessed/performed Overall Cognitive Status: Within Functional Limits for tasks assessed                      General Comments General comments (skin integrity, edema, etc.): Pt sitting on edge of bed upon PT arrival.  Nursing cleared pt for participation in physical therapy.  Pt agreeable to PT session.    Exercises  x10 sit to/from stands with RW (pt requiring seated rest breaks)      Assessment/Plan    PT Assessment Patient needs continued PT services  PT Diagnosis Generalized weakness   PT Problem List Decreased activity tolerance;Decreased balance;Decreased mobility;Pain  PT Treatment Interventions DME instruction;Gait training;Stair training;Functional mobility training;Therapeutic activities;Therapeutic exercise;Balance training;Patient/family education   PT Goals (Current goals can be found in the Care Plan section) Acute Rehab PT Goals Patient Stated Goal: to go home PT Goal Formulation: With patient Time For Goal Achievement: 04/02/16    Frequency Min 2X/week   Barriers to discharge        Co-evaluation               End of Session Equipment Utilized During Treatment: Gait belt;Oxygen (2 L/min via nasal cannula) Activity Tolerance: Patient tolerated treatment well Patient left: in chair;with call bell/phone within reach;with chair alarm set Nurse Communication: Mobility status;Precautions (O2 sats during session)         Time: [id number] PT Time Calculation (min) (ACUTE ONLY): 28 min   Charges:   PT Evaluation $PT Eval Low Complexity: 1 Procedure PT Treatments $Therapeutic Exercise: 8-22 mins   PT G CodesLeitha Bleak Mar 30, 2016, 1:19 PM [name], [place]

## 2016-03-19 NOTE — Discharge Summary (Signed)
[facility] NAME: [name]    MR#:  [id number]  DATE OF BIRTH:  December 23, 1960  DATE OF ADMISSION:  03/15/2016 ADMITTING PHYSICIAN: Bettey Costa, MD  DATE OF DISCHARGE: 03/19/16  PRIMARY CARE PHYSICIAN: Ellamae Sia, MD    ADMISSION DIAGNOSIS:  Generalized abdominal pain [R10.84] Intractable vomiting with nausea, vomiting of unspecified type [R11.10]  DISCHARGE DIAGNOSIS:  Principal Problem:   Major depressive disorder, recurrent episode, moderate (HCC) Active Problems:   Abdominal pain   Adjustment disorder with mixed anxiety and depressed mood   Generalized abdominal pain   Uncontrollable vomiting   Blood in stool   Intractable cyclical vomiting with nausea   Reflux esophagitis   Gastritis   SECONDARY DIAGNOSIS:   Past Medical History  Diagnosis Date  . Asthma   . Collagen vascular disease ([place])   . Diabetes mellitus without complication ([place])   . Hypertension   . Coronary artery disease     a. cath 2013: stenting to RCA (report not available); b. cath 2014: LM nl, pLAD 40%, mLAD nl, ost LCx 40%, mid LCx nl, pRCA 30% @ site of prior stent, mRCA 50%  . Diabetic neuropathy ([place])   . ESRD (end stage renal disease) on dialysis ([place])     M-W-F  . GERD (gastroesophageal reflux disease)   . Hx of pancreatitis 2015  . Myocardial infarction ([place])   . Pneumonia   . Mitral regurgitation     a. echo 10/2013: EF 62%, noWMA, mildly dilated LA, mild to mod MR/TR, GR1DD  . COPD (chronic obstructive pulmonary disease) ([place])   . chronic diastolic CHF [id number]  . dialysis 2006  . Depression   . Anxiety   . Arthritis   . Headache   . Anemia   . Renal insufficiency     HOSPITAL COURSE:   55 year old female with end-stage renal disease on Monday Wednesday Friday hemodialysis, coronary artery disease, chronic pancreatitis with chronic nausea and abdominal pain, pressure and anxiety presents to the hospital secondary to  worsening abdominal pain and dark stools.  #1 abdominal pain-acute on chronic pancreatitis. Has chronic abdominal pain. Lipase is normal -Pain medications as needed. -Appreciate GI consult. Advanced diet as tolerated. -Continue pancreatic supplements  #2 Melena-stable hemoglobin. S/p EGD showing- reflux esophagitis. - Patient on aspirin and Plavix. -On PPI  #3 hypertension-on Norvasc and hydralazine  #4 depression and anxiety with hallucinations- much improved -appreciate psych consult.  Continue Celexa  #5 end-stage renal disease on hemodialysis-on Monday Wednesday Friday schedule. Dialysis today per schedule.  #6 COPD- with mild wheezing - received a dose of solumedrol with improvement - cont inhalers and monitor, nebs - on 2L o2 - wean as tolerated - has nocturnal home o2  Discharge with home health today  DISCHARGE CONDITIONS:   Guarded  CONSULTS OBTAINED:  Treatment Team:  Lavonia Dana, MD Clovis Fredrickson, MD [name], MD Gonzella Lex, MD  DRUG ALLERGIES:   Allergies  Allergen Reactions  . Compazine [Prochlorperazine Edisylate] Anaphylaxis and Nausea And Vomiting    23 Jul - patient relates that she takes promethazine frequently with no problems.  . Ace Inhibitors Swelling  . Ativan [Lorazepam] Other (See Comments)    Reaction:  Hallucinations and headaches  . Codeine Nausea And Vomiting  . Dilaudid [Hydromorphone Hcl] Other (See Comments)    Delirium  . Gabapentin Other (See Comments)    Reaction:  Unknown   . Losartan Other (See Comments)  Reaction:  Unknown   . Ondansetron Other (See Comments)    Reaction:  Unknown   . Prochlorperazine Other (See Comments)    Reaction:  Unknown   . Scopolamine Other (See Comments)    Reaction:  Unknown   . Zofran [Ondansetron Hcl] Other (See Comments)    Reaction:  hallucinations   . Oxycodone Anxiety  . Tape Rash    DISCHARGE MEDICATIONS:   Current Discharge Medication List    START taking  these medications   Details  metoCLOPramide (REGLAN) 5 MG tablet Take 1 tablet (5 mg total) by mouth 4 (four) times daily -  before meals and at bedtime. Qty: 120 tablet, Refills: 2    pantoprazole (PROTONIX) 40 MG tablet Take 1 tablet (40 mg total) by mouth 2 (two) times daily before a meal. Qty: 60 tablet, Refills: 2    vancomycin (VANCOCIN) 50 mg/mL oral solution Take 2.5 mLs (125 mg total) by mouth every 6 (six) hours. X 8 more days Qty: 50 mL, Refills: 0      CONTINUE these medications which have CHANGED   Details  HYDROcodone-acetaminophen (NORCO/VICODIN) 5-325 MG tablet Take 1 tablet by mouth every 6 (six) hours as needed for moderate pain or severe pain. Qty: 20 tablet, Refills: 0      CONTINUE these medications which have NOT CHANGED   Details  acidophilus (RISAQUAD) CAPS capsule Take 1 capsule by mouth daily.    albuterol (PROVENTIL) (2.5 MG/3ML) 0.083% nebulizer solution Take 2.5 mg by nebulization every 4 (four) hours as needed for wheezing or shortness of breath.    amLODipine (NORVASC) 10 MG tablet Take 10 mg by mouth daily.    aspirin EC 81 MG tablet Take 81 mg by mouth every morning.     atorvastatin (LIPITOR) 20 MG tablet Take 20 mg by mouth every evening.    B COMPLEX-C-FOLIC ACID PO Take 1 tablet by mouth daily.    bisacodyl (DULCOLAX) 5 MG EC tablet Take 1 tablet (5 mg total) by mouth daily as needed for moderate constipation. Qty: 30 tablet, Refills: 0    cetirizine (ZYRTEC) 10 MG tablet Take 10 mg by mouth every evening.     cholecalciferol (VITAMIN D) 1000 units tablet Take 1,000 Units by mouth daily.    citalopram (CELEXA) 20 MG tablet Take 20 mg by mouth every morning.     clopidogrel (PLAVIX) 75 MG tablet Take 75 mg by mouth daily. Reported on 12/17/2015    hydrALAZINE (APRESOLINE) 25 MG tablet Take 25 mg by mouth 3 (three) times daily.    lipase/protease/amylase (CREON) 12000 UNITS CPEP capsule Take 12,000-36,000 Units by mouth See admin  instructions. Take 3 capsules (36,000 units) by mouth every morning, Take 2 capsules (24,000 units) by mouth at lunch, Take 3 capsules (36,000 units) by mouth every night at bedtime, Then 1 capsule by mouth with snack.    multivitamin (RENA-VIT) TABS tablet Take 1 tablet by mouth daily.    nitroGLYCERIN (NITROSTAT) 0.4 MG SL tablet Place 0.4 mg under the tongue every 5 (five) minutes x 3 doses as needed for chest pain. *Max 3 doses per episode*    omega-3 acid ethyl esters (LOVAZA) 1 g capsule Take 1 g by mouth daily.    promethazine (PHENERGAN) 25 MG tablet Take 25 mg by mouth every 6 (six) hours as needed for nausea or vomiting.    ranitidine (ZANTAC) 150 MG tablet Take 150 mg by mouth every evening.     ranolazine (RANEXA) 1000 MG  SR tablet Take 1,000 mg by mouth 2 (two) times daily.    sevelamer carbonate (RENVELA) 800 MG tablet Take 800 mg by mouth 3 (three) times daily with meals.     vitamin B-12 (CYANOCOBALAMIN) 1000 MCG tablet Take 1,000 mcg by mouth daily.    oxymetazoline (AFRIN) 0.05 % nasal spray Place 2 sprays into both nostrils 2 (two) times daily. Qty: 15 mL, Refills: 0         DISCHARGE INSTRUCTIONS:   1. PCP f/u in 1 week 2. Dialysis in 2 days 3. GI f/u in 2 weeks  If you experience worsening of your admission symptoms, develop shortness of breath, life threatening emergency, suicidal or homicidal thoughts you must seek medical attention immediately by calling 911 or calling your MD immediately  if symptoms less severe.  You Must read complete instructions/literature along with all the possible adverse reactions/side effects for all the Medicines you take and that have been prescribed to you. Take any new Medicines after you have completely understood and accept all the possible adverse reactions/side effects.   Please note  You were cared for by a hospitalist during your hospital stay. If you have any questions about your discharge medications or the care you  received while you were in the hospital after you are discharged, you can call the unit and asked to speak with the hospitalist on call if the hospitalist that took care of you is not available. Once you are discharged, your primary care physician will handle any further medical issues. Please note that NO REFILLS for any discharge medications will be authorized once you are discharged, as it is imperative that you return to your primary care physician (or establish a relationship with a primary care physician if you do not have one) for your aftercare needs so that they can reassess your need for medications and monitor your lab values.    Today   CHIEF COMPLAINT:   Chief Complaint  Patient presents with  . Emesis    VITAL SIGNS:  Blood pressure 160/64, pulse 79, temperature 96.8 F (36 C), temperature source Oral, resp. rate 18, height 5\' 5"  (1.651 m), weight 77.61 kg (171 lb 1.6 oz), SpO2 100 %.  I/O:   Intake/Output Summary (Last 24 hours) at 03/19/16 1458 Last data filed at 03/19/16 0528  Gross per 24 hour  Intake    320 ml  Output      0 ml  Net    320 ml    PHYSICAL EXAMINATION:   Physical Exam  GENERAL: 55 y.o.-year-old patient lying in the bed with no acute distress.  EYES: Pupils equal, round, reactive to light and accommodation. No scleral icterus. Extraocular muscles intact.  HEENT: Head atraumatic, normocephalic. Oropharynx and nasopharynx clear. Dry mucous membranes NECK: Supple, no jugular venous distention. No thyroid enlargement, no tenderness.  LUNGS: Normal breath sounds bilaterally, no wheezing, rales,rhonchi or crepitation. No use of accessory muscles of respiration.  CARDIOVASCULAR: S1, S2 normal. No murmurs, rubs, or gallops.  ABDOMEN: Soft, nontender, nondistended. Bowel sounds present. No organomegaly or mass.  EXTREMITIES: No pedal edema, cyanosis, or clubbing. Left arm AV fistula NEUROLOGIC: Cranial nerves II through XII are intact. Muscle  strength 5/5 in all extremities. Sensation intact. Gait not checked.  PSYCHIATRIC: The patient is alert and oriented x 3.  SKIN: No obvious rash, lesion, or ulcer.   DATA REVIEW:   CBC  Recent Labs Lab 03/16/16 0404  WBC 5.1  HGB 10.0*  HCT 31.5*  PLT 194    Chemistries   Recent Labs Lab 03/15/16 0742  NA 136  K 3.9  CL 97*  CO2 29  GLUCOSE 124*  BUN 26*  CREATININE 5.63*  CALCIUM 8.4*  AST 27  ALT 15  ALKPHOS 106  BILITOT 0.9    Cardiac Enzymes  Recent Labs Lab 03/14/16 0810  TROPONINI <0.03    Microbiology Results  Results for orders placed or performed during the hospital encounter of 03/15/16  Gastrointestinal Panel by PCR , Stool     Status: None   Collection Time: 03/16/16  2:25 PM  Result Value Ref Range Status   Campylobacter species NOT DETECTED NOT DETECTED Final   Plesimonas shigelloides NOT DETECTED NOT DETECTED Final   Salmonella species NOT DETECTED NOT DETECTED Final   Yersinia enterocolitica NOT DETECTED NOT DETECTED Final   Vibrio species NOT DETECTED NOT DETECTED Final   Vibrio cholerae NOT DETECTED NOT DETECTED Final   Enteroaggregative E coli (EAEC) NOT DETECTED NOT DETECTED Final   Enteropathogenic E coli (EPEC) NOT DETECTED NOT DETECTED Final   Enterotoxigenic E coli (ETEC) NOT DETECTED NOT DETECTED Final   Shiga like toxin producing E coli (STEC) NOT DETECTED NOT DETECTED Final   E. coli O157 NOT DETECTED NOT DETECTED Final   Shigella/Enteroinvasive E coli (EIEC) NOT DETECTED NOT DETECTED Final   Cryptosporidium NOT DETECTED NOT DETECTED Final   Cyclospora cayetanensis NOT DETECTED NOT DETECTED Final   Entamoeba histolytica NOT DETECTED NOT DETECTED Final   Giardia lamblia NOT DETECTED NOT DETECTED Final   Adenovirus F40/41 NOT DETECTED NOT DETECTED Final   Astrovirus NOT DETECTED NOT DETECTED Final   Norovirus GI/GII NOT DETECTED NOT DETECTED Final   Rotavirus A NOT DETECTED NOT DETECTED Final   Sapovirus (I, II, IV, and  V) NOT DETECTED NOT DETECTED Final  C difficile quick scan w PCR reflex     Status: Abnormal   Collection Time: 03/16/16  2:25 PM  Result Value Ref Range Status   C Diff antigen POSITIVE (A) NEGATIVE Final   C Diff toxin NEGATIVE NEGATIVE Final   C Diff interpretation   Final    Positive for toxigenic C. difficile, active toxin production not detected. Patient has toxigenic C. difficile organisms present in the bowel, but toxin was not detected. The patient may be a carrier or the level of toxin in the sample was below the limit  of detection. This information should be used in conjunction with the patient's clinical history when deciding on possible therapy.     Comment: CRITICAL RESULT CALLED TO, READ BACK BY AND VERIFIED WITH: Daleen Snook @ [id number] 03/16/16 by [facility]   Clostridium Difficile by PCR     Status: Abnormal   Collection Time: 03/16/16  2:25 PM  Result Value Ref Range Status   Toxigenic C Difficile by pcr POSITIVE (A) NEGATIVE Final    Comment: CRITICAL RESULT CALLED TO, READ BACK BY AND VERIFIED WITH: Daleen Snook @ [id number] 03/16/16 by [facility]   MRSA PCR Screening     Status: None   Collection Time: 03/18/16 10:46 AM  Result Value Ref Range Status   MRSA by PCR NEGATIVE NEGATIVE Final    Comment:        The GeneXpert MRSA Assay (FDA approved for NASAL specimens only), is one component of a comprehensive MRSA colonization surveillance program. It is not intended to diagnose MRSA infection nor to guide or monitor treatment for MRSA infections.     RADIOLOGY:  No  results found.  EKG:   Orders placed or performed during the hospital encounter of 03/15/16  . ED EKG  . ED EKG      Management plans discussed with the patient, family and they are in agreement.  CODE STATUS:     Code Status Orders        Start     Ordered   03/15/16 1323  Full code   Continuous     03/15/16 1322    Code Status History    Date Active Date Inactive Code Status Order ID Comments  User Context   02/16/2016  2:09 PM 02/19/2016 10:38 PM Full Code [id number]  Bettey Costa, MD ED   12/17/2015 11:39 AM 12/21/2015 10:09 PM Full Code [id number]  Dustin Flock, MD ED   11/12/2015  4:02 PM 12/01/2015  7:43 PM Full Code [id number]  Bettey Costa, MD Inpatient   10/07/2015  9:37 AM 10/12/2015  7:59 PM Full Code [id number]  Fritzi Mandes, MD ED   10/03/2015  3:55 AM 10/05/2015 10:30 PM Full Code [id number]  Lance Coon, MD Inpatient   08/31/2015  4:00 PM 09/03/2015 10:09 PM Full Code [id number]  Demetrios Loll, MD ED   07/26/2015  3:28 PM 07/26/2015  9:36 PM Full Code [id number]  Dionisio David, MD Inpatient   07/22/2015  5:36 PM 07/26/2015  3:28 PM Full Code [id number]  Vaughan Basta, MD Inpatient   06/05/2015  8:39 PM 06/07/2015  8:03 PM Full Code [id number]  Bettey Costa, MD Inpatient   05/21/2015  9:42 PM 05/24/2015  4:26 PM Full Code [id number]  Henreitta Leber, MD Inpatient   04/24/2015  8:55 AM 04/26/2015 10:11 PM Full Code [id number]  Demetrios Loll, MD Inpatient   04/01/2015  3:58 PM 04/05/2015  8:07 PM Full Code [id number]  Epifanio Lesches, MD ED   03/06/2015  8:05 AM 03/09/2015  9:11 PM Full Code [id number]  Juluis Mire, MD Inpatient   02/19/2015  4:14 PM 02/24/2015  5:41 PM Full Code [id number]  Aldean Jewett, MD Inpatient      TOTAL TIME TAKING CARE OF THIS PATIENT: 37 minutes.    Gladstone Lighter M.D on 03/19/2016 at 2:58 PM  Between 7am to 6pm - Pager - [phone]  After 6pm go to [email or web address] - password [email or web address] [facility]  Office  [phone]  CC: Primary care physician; Ellamae Sia, MD

## 2016-03-19 NOTE — Progress Notes (Signed)
Hemodialysis ended by patient

## 2016-03-19 NOTE — Progress Notes (Signed)
Patient complaining of severe abdominal and right flank pain. 8/10. Patient states its a "dull ache but hurts really bad.". Right side is tender to touch. Repositioned patient in bed. Patient medicated. Plan is for patient to receive dialysis and then discharged. Will have Nurse continue to follow-up with patient to see if symptoms are relieved after dialysis.

## 2016-03-19 NOTE — Progress Notes (Signed)
Hemodialysis start 

## 2016-03-19 NOTE — Progress Notes (Signed)
[facility]  ROUNDING NOTE   Subjective:   Admitted for nausea/vomiting/abdominal pain. Was taken off metoclopramideprior to admission due to extra-pyramidal side effects.  Resting quietly No acute c/o  Feels well this morning Asking about going home     Objective:  Vital signs in last 24 hours:  Temp:  [96.6 F (35.9 C)-98.1 F (36.7 C)] 96.8 F (36 C) (06/28 0756) Pulse Rate:  [71-80] 79 (06/28 0756) Resp:  [12-19] 18 (06/28 0756) BP: (125-160)/(46-68) 160/64 mmHg (06/28 0756) SpO2:  [98 %-100 %] 100 % (06/28 0756) Weight:  [77.61 kg (171 lb 1.6 oz)] 77.61 kg (171 lb 1.6 oz) (06/28 0406)  Weight change: 1.11 kg (2 lb 7.2 oz) Filed Weights   03/17/16 1252 03/18/16 0500 03/19/16 0406  Weight: 75.4 kg (166 lb 3.6 oz) 77.338 kg (170 lb 8 oz) 77.61 kg (171 lb 1.6 oz)    Intake/Output: I/O last 3 completed shifts: In: 1454.2 [P.O.:320; I.V.:1134.2] Out: 1 [Stool:1]   Intake/Output this shift:     Physical Exam: General: NAD, laying in bed  Head: Normocephalic, atraumatic. Moist oral mucosal membranes  Eyes: Anicteric,  Neck: Supple, trachea midline  Lungs:  Normal effort  Heart: Regular rate and rhythm  Abdomen:  Soft, nontender, obese  Extremities: no peripheral edema.  Neurologic: Nonfocal, moving all four extremities  Skin: No lesions  Access: Left arm AVG    Basic Metabolic Panel:  Recent Labs Lab 03/14/16 0810 03/15/16 0742  NA 133* 136  K 3.8 3.9  CL 94* 97*  CO2 30 29  GLUCOSE 80 124*  BUN 20 26*  CREATININE 4.46* 5.63*  CALCIUM 8.5* 8.4*    Liver Function Tests:  Recent Labs Lab 03/14/16 0810 03/15/16 0742  AST 22 27  ALT 14 15  ALKPHOS 104 106  BILITOT 0.8 0.9  PROT 7.9 7.8  ALBUMIN 3.1* 3.1*    Recent Labs Lab 03/14/16 0810 03/15/16 0742  LIPASE <10* 18   No results for input(s): AMMONIA in the last 168 hours.  CBC:  Recent Labs Lab 03/14/16 0810 03/15/16 0742 03/16/16 0404  WBC 6.1 5.2 5.1   NEUTROABS 2.7  --   --   HGB 9.9* 9.9* 10.0*  HCT 31.0* 31.0* 31.5*  MCV 77.4* 78.9* 78.3*  PLT 202 192 194    Cardiac Enzymes:  Recent Labs Lab 03/14/16 0810  TROPONINI <0.03    BNP: Invalid input(s): POCBNP  CBG:  Recent Labs Lab 03/18/16 1838 03/18/16 2110 03/19/16 0152 03/19/16 0417 03/19/16 0748  GLUCAP 78 90 70 78 71    Microbiology: Results for orders placed or performed during the hospital encounter of 03/15/16  Gastrointestinal Panel by PCR , Stool     Status: None   Collection Time: 03/16/16  2:25 PM  Result Value Ref Range Status   Campylobacter species NOT DETECTED NOT DETECTED Final   Plesimonas shigelloides NOT DETECTED NOT DETECTED Final   Salmonella species NOT DETECTED NOT DETECTED Final   Yersinia enterocolitica NOT DETECTED NOT DETECTED Final   Vibrio species NOT DETECTED NOT DETECTED Final   Vibrio cholerae NOT DETECTED NOT DETECTED Final   Enteroaggregative E coli (EAEC) NOT DETECTED NOT DETECTED Final   Enteropathogenic E coli (EPEC) NOT DETECTED NOT DETECTED Final   Enterotoxigenic E coli (ETEC) NOT DETECTED NOT DETECTED Final   Shiga like toxin producing E coli (STEC) NOT DETECTED NOT DETECTED Final   E. coli O157 NOT DETECTED NOT DETECTED Final   Shigella/Enteroinvasive E coli (EIEC) NOT  DETECTED NOT DETECTED Final   Cryptosporidium NOT DETECTED NOT DETECTED Final   Cyclospora cayetanensis NOT DETECTED NOT DETECTED Final   Entamoeba histolytica NOT DETECTED NOT DETECTED Final   Giardia lamblia NOT DETECTED NOT DETECTED Final   Adenovirus F40/41 NOT DETECTED NOT DETECTED Final   Astrovirus NOT DETECTED NOT DETECTED Final   Norovirus GI/GII NOT DETECTED NOT DETECTED Final   Rotavirus A NOT DETECTED NOT DETECTED Final   Sapovirus (I, II, IV, and V) NOT DETECTED NOT DETECTED Final  C difficile quick scan w PCR reflex     Status: Abnormal   Collection Time: 03/16/16  2:25 PM  Result Value Ref Range Status   C Diff antigen POSITIVE (A)  NEGATIVE Final   C Diff toxin NEGATIVE NEGATIVE Final   C Diff interpretation   Final    Positive for toxigenic C. difficile, active toxin production not detected. Patient has toxigenic C. difficile organisms present in the bowel, but toxin was not detected. The patient may be a carrier or the level of toxin in the sample was below the limit  of detection. This information should be used in conjunction with the patient's clinical history when deciding on possible therapy.     Comment: CRITICAL RESULT CALLED TO, READ BACK BY AND VERIFIED WITH: Daleen Snook @ [id number] 03/16/16 by [facility]   Clostridium Difficile by PCR     Status: Abnormal   Collection Time: 03/16/16  2:25 PM  Result Value Ref Range Status   Toxigenic C Difficile by pcr POSITIVE (A) NEGATIVE Final    Comment: CRITICAL RESULT CALLED TO, READ BACK BY AND VERIFIED WITH: Daleen Snook @ [id number] 03/16/16 by [facility]   MRSA PCR Screening     Status: None   Collection Time: 03/18/16 10:46 AM  Result Value Ref Range Status   MRSA by PCR NEGATIVE NEGATIVE Final    Comment:        The GeneXpert MRSA Assay (FDA approved for NASAL specimens only), is one component of a comprehensive MRSA colonization surveillance program. It is not intended to diagnose MRSA infection nor to guide or monitor treatment for MRSA infections.     Coagulation Studies: No results for input(s): LABPROT, INR in the last 72 hours.  Urinalysis: No results for input(s): COLORURINE, LABSPEC, PHURINE, GLUCOSEU, HGBUR, BILIRUBINUR, KETONESUR, PROTEINUR, UROBILINOGEN, NITRITE, LEUKOCYTESUR in the last 72 hours.  Invalid input(s): APPERANCEUR    Imaging: No results found.   Medications:     . acidophilus  1 capsule Oral Daily  . amLODipine  10 mg Oral Daily  . aspirin EC  81 mg Oral BH-q7a  . atorvastatin  20 mg Oral QPM  . cholecalciferol  1,000 Units Oral Daily  . citalopram  20 mg Oral BH-q7a  . clopidogrel  75 mg Oral Daily  . dextrose  25 mL  Intravenous Once  . haloperidol  1 mg Oral BID  . heparin  5,000 Units Subcutaneous Q8H  . hydrALAZINE  25 mg Oral TID  . insulin aspart  0-5 Units Subcutaneous QHS  . insulin aspart  0-9 Units Subcutaneous TID WC  . lipase/protease/amylase  12,000 Units Oral With snacks  . lipase/protease/amylase  24,000 Units Oral Q lunch  . lipase/protease/amylase  36,000 Units Oral BID AC & HS  . loratadine  10 mg Oral Daily  . methylPREDNISolone (SOLU-MEDROL) injection  60 mg Intravenous STAT  . metoCLOPramide  5 mg Oral TID AC & HS  . multivitamin  1 tablet Oral Daily  .  omega-3 acid ethyl esters  1 g Oral Daily  . pantoprazole  40 mg Oral BID AC  . ranolazine  1,000 mg Oral BID  . sevelamer carbonate  800 mg Oral TID WC  . vancomycin  125 mg Oral Q6H  . vitamin B-12  1,000 mcg Oral Daily   acetaminophen **OR** acetaminophen, albuterol, bisacodyl, bisacodyl, HYDROmorphone (DILAUDID) injection, iopamidol, nitroGLYCERIN, polyethylene glycol, promethazine, sodium phosphate  Assessment/ Plan:  Ms. ALICESON VREDENBURG is a 55 y.o. black female with insulin dependent diabetes, hypertension, hyperlipidemia, diabetic retinopathy, diabetic neuropathy, diabetic gastroparesis, pancreatic insufficiency, depression, GERD. Renal masses - avoiding epo   MWF [facility] [address]   1. End Stage Renal Disease: no acute indication for dialysis.  - Continue MWF schedule.  - next HD planned for tomorrow  2. Anemia of chronic kidney disease: hemoglobin 10.  Not getting erythropoeitin stimulating agent as outpatient due to renal masses. However CT from 6/24 does not seem to show any renal masses.   3. Secondary Hyperparathyroidism: PTH 284 as outpatient. Phos 5.5  - Continue sevelamer    LOS: 4 Arrion Burruel 6/28/201710:52 AM

## 2016-03-20 LAB — SURGICAL PATHOLOGY

## 2016-03-20 IMAGING — CR DG CHEST 1V
1 series · 1 of 1 positions shown · non-contrast
Comparison: November 12, 2015

CLINICAL DATA: End-stage renal disease and anemia

EXAM:
CHEST 1 VIEW

[ap]
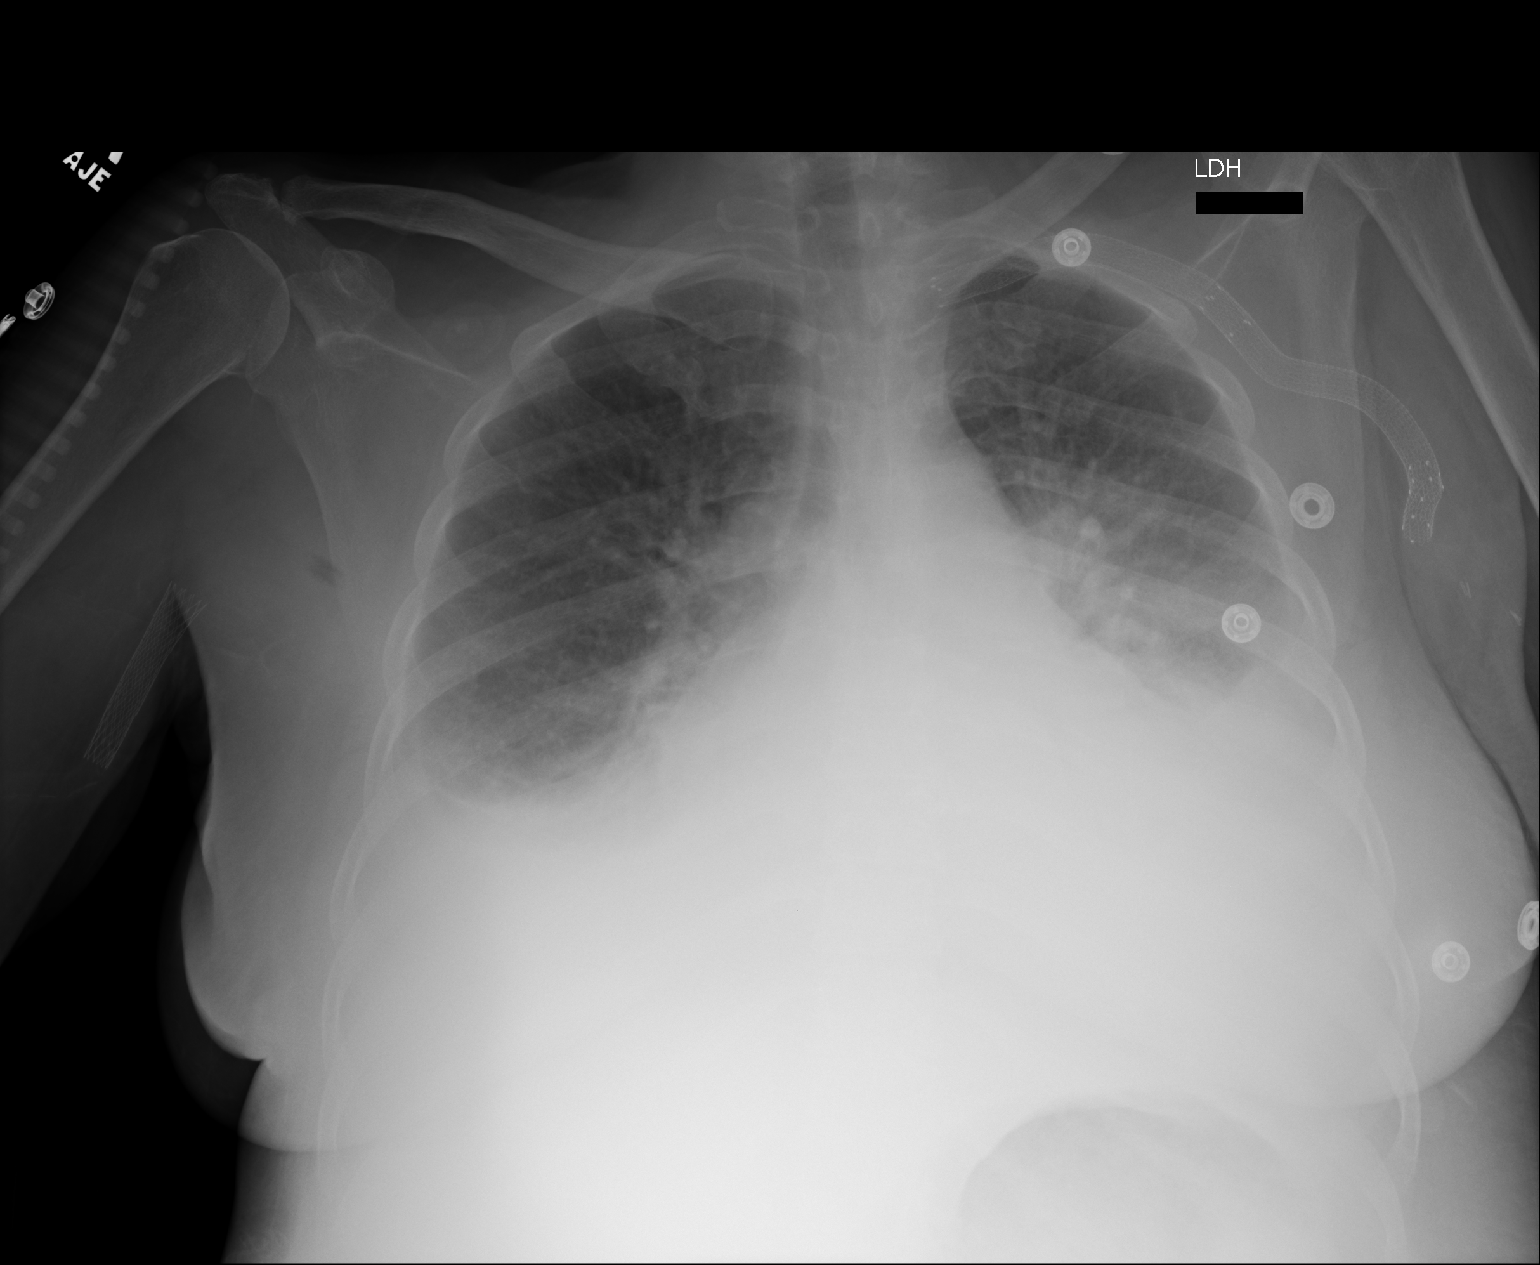

[1 of 1 positions shown; findings below may reference images not displayed]

FINDINGS: There is cardiomegaly with pulmonary venous hypertension. There are
bilateral pleural effusions. There is mild bibasilar interstitial
edema. There is atelectatic change in the lung bases as well. There
is a left axillary region stent.
IMPRESSION: Findings consistent with congestive heart failure. Bibasilar
atelectatic change; superimposed early pneumonia in the bases cannot
be excluded radiographically.

## 2016-03-21 IMAGING — CR DG CHEST 1V
1 series · 1 of 1 positions shown · non-contrast
Comparison: Portable chest x-ray November 13, 2015

CLINICAL DATA: Pulmonary edema.  History of CHF and asthma.

EXAM:
CHEST 1 VIEW

[ap]
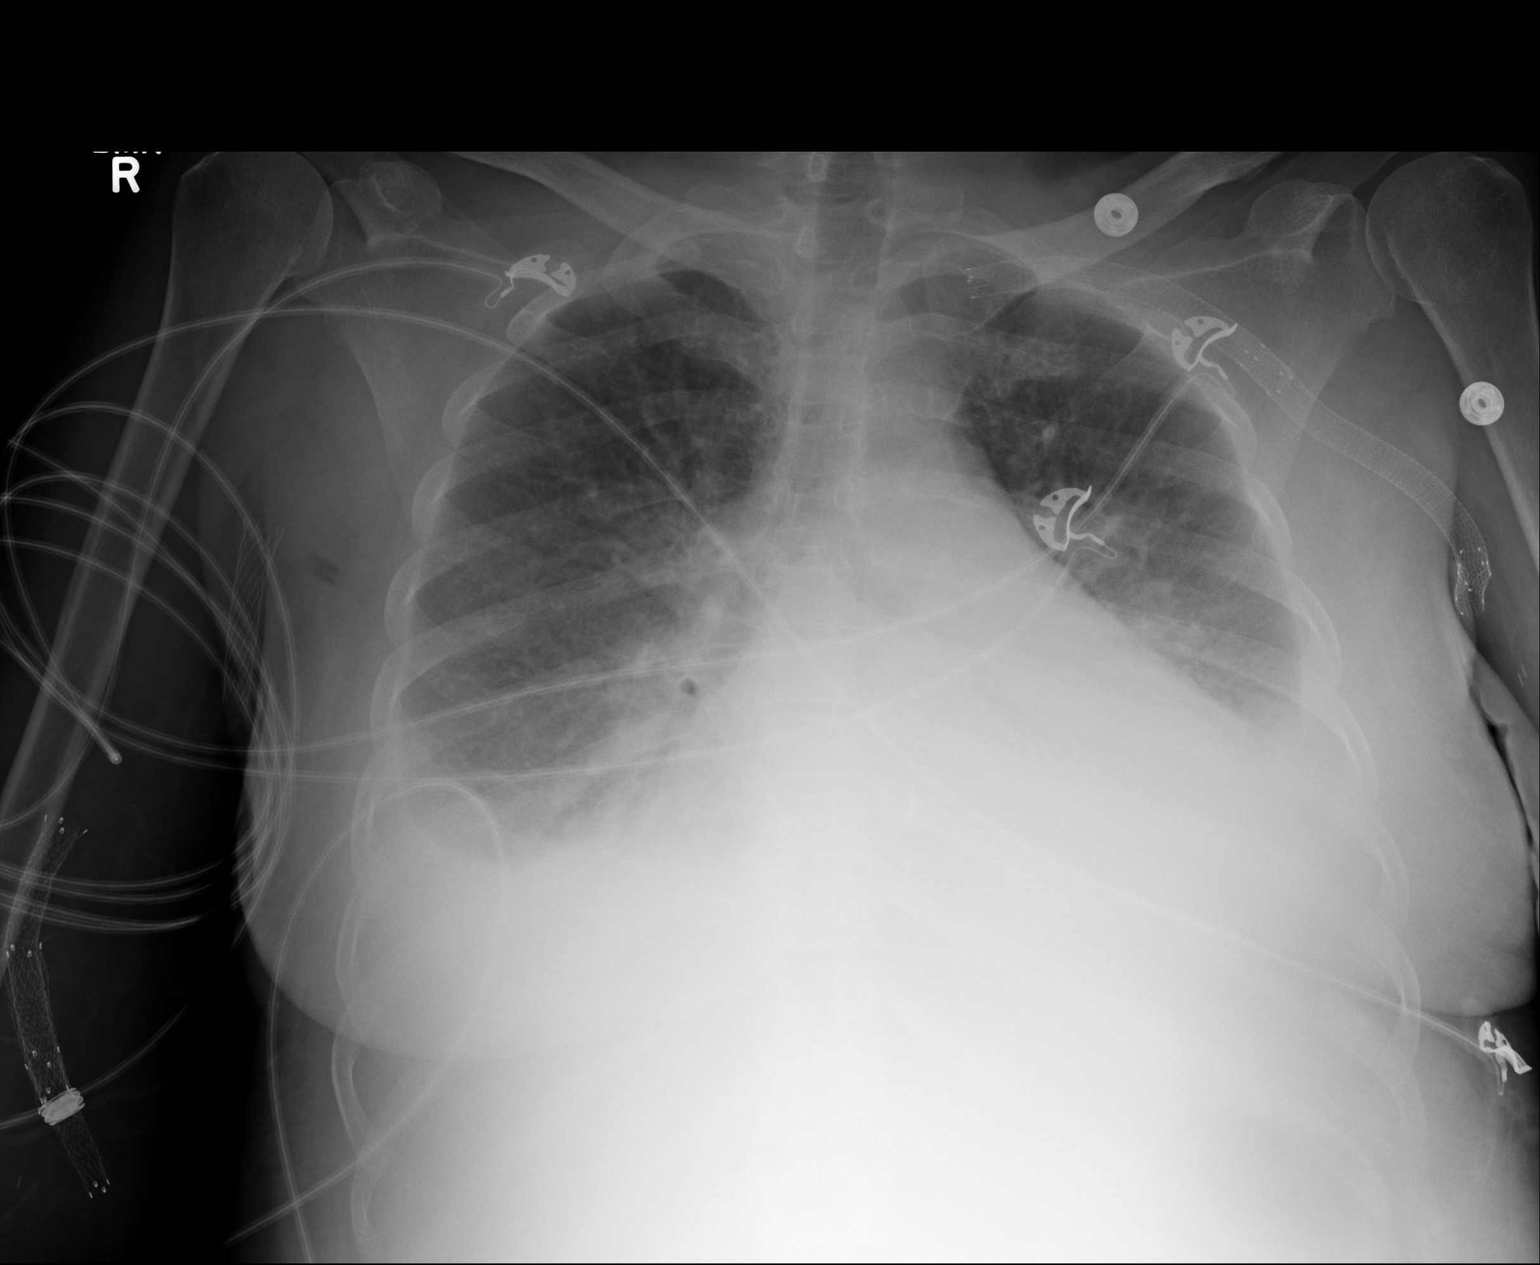

[1 of 1 positions shown; findings below may reference images not displayed]

FINDINGS: The lungs remain hypoinflated. Moderate-sized bilateral pleural
effusions remain. The retrocardiac region on the left remains dense.
The cardiac silhouette remains enlarged and the pulmonary
vascularity remains engorged. The mediastinum is normal in width.
The observed bony thorax is unremarkable. A vascular graft is
present in the right antecubital and left axillary regions.
IMPRESSION: Congestive heart failure with pulmonary interstitial edema and
bilateral pleural effusions. There has not been significant interval
change since yesterday's study. Density at both lung bases may
reflect atelectasis or pneumonia.

## 2016-03-21 IMAGING — DX DG ABDOMEN 1V
1 series · 1 of 1 positions shown · non-contrast
Comparison: None.

CLINICAL DATA: OG tube placement.

EXAM:
ABDOMEN - 1 VIEW

[abdomen kub]
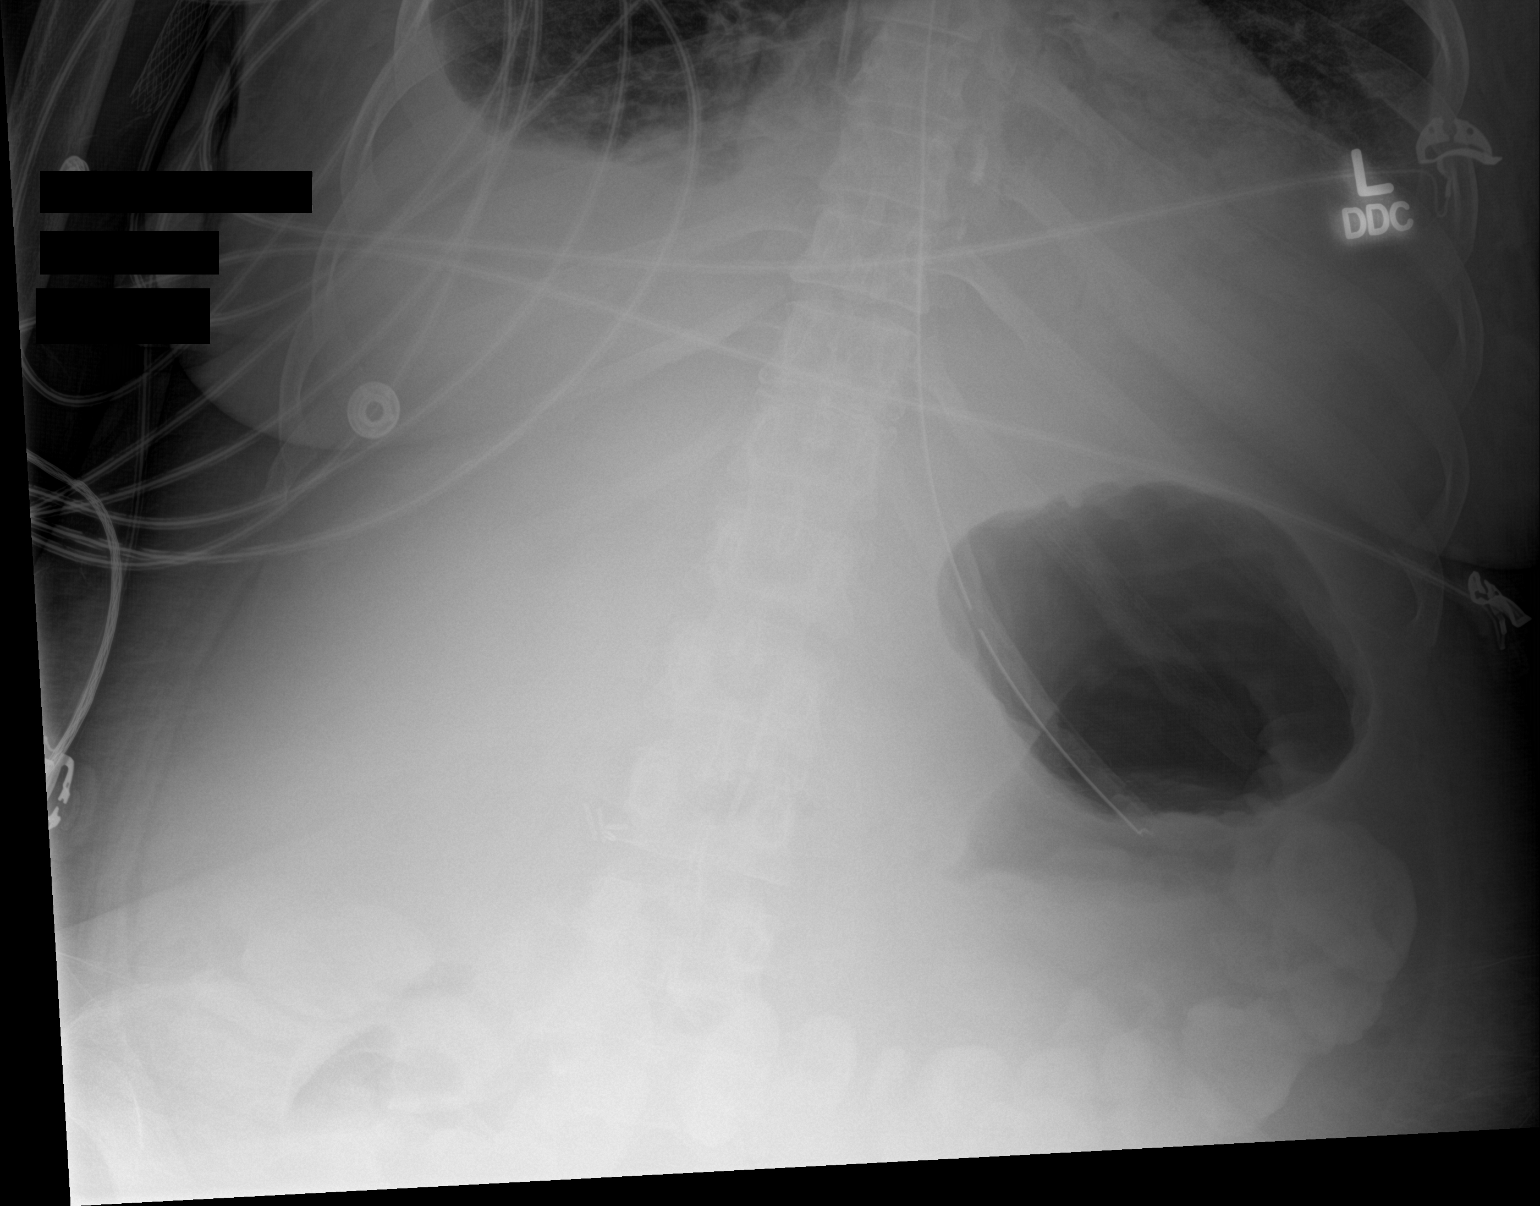

[1 of 1 positions shown; findings below may reference images not displayed]

FINDINGS: OG tube is in place in good position with the side-port in the
stomach.
IMPRESSION: As above.

## 2016-03-22 IMAGING — CR DG CHEST 1V PORT
1 series · 1 of 1 positions shown · non-contrast
Comparison: 11/14/2015

CLINICAL DATA: Acute respiratory failure

EXAM:
PORTABLE CHEST 1 VIEW

[ap]
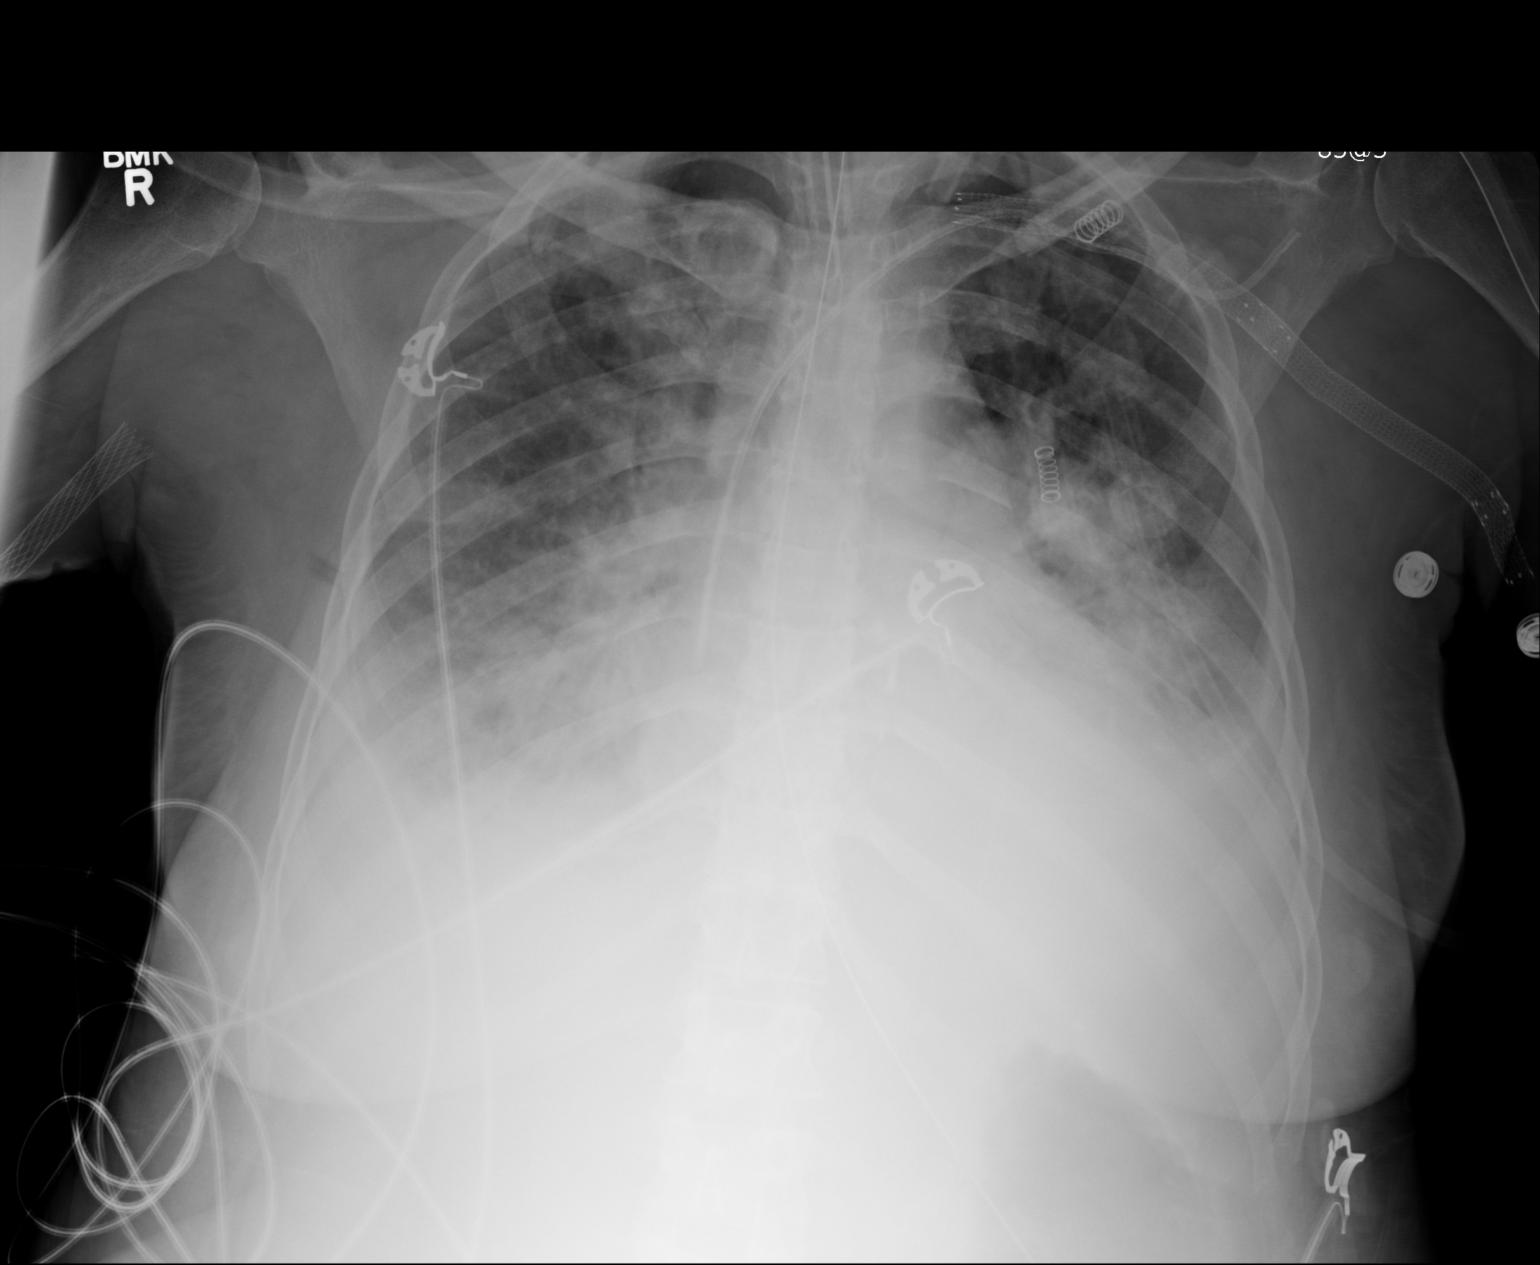

[1 of 1 positions shown; findings below may reference images not displayed]

FINDINGS: Endotracheal tube in good position. Central venous catheter tip near
the cavoatrial junction unchanged. No pneumothorax. NG tube in the
stomach

Progression of diffuse bilateral airspace disease. Bilateral pleural
effusions and bibasilar atelectasis.
IMPRESSION: Endotracheal tube remains in good position

Worsening bilateral airspace disease most likely heart failure with
edema.

## 2016-03-24 IMAGING — DX DG CHEST 1V
1 series · 1 of 1 positions shown · non-contrast
Comparison: 11/15/2015 and prior exams

CLINICAL DATA: 54-year-old female with shortness of breath and
respiratory failure.

EXAM:
CHEST 1 VIEW

[chest ap]
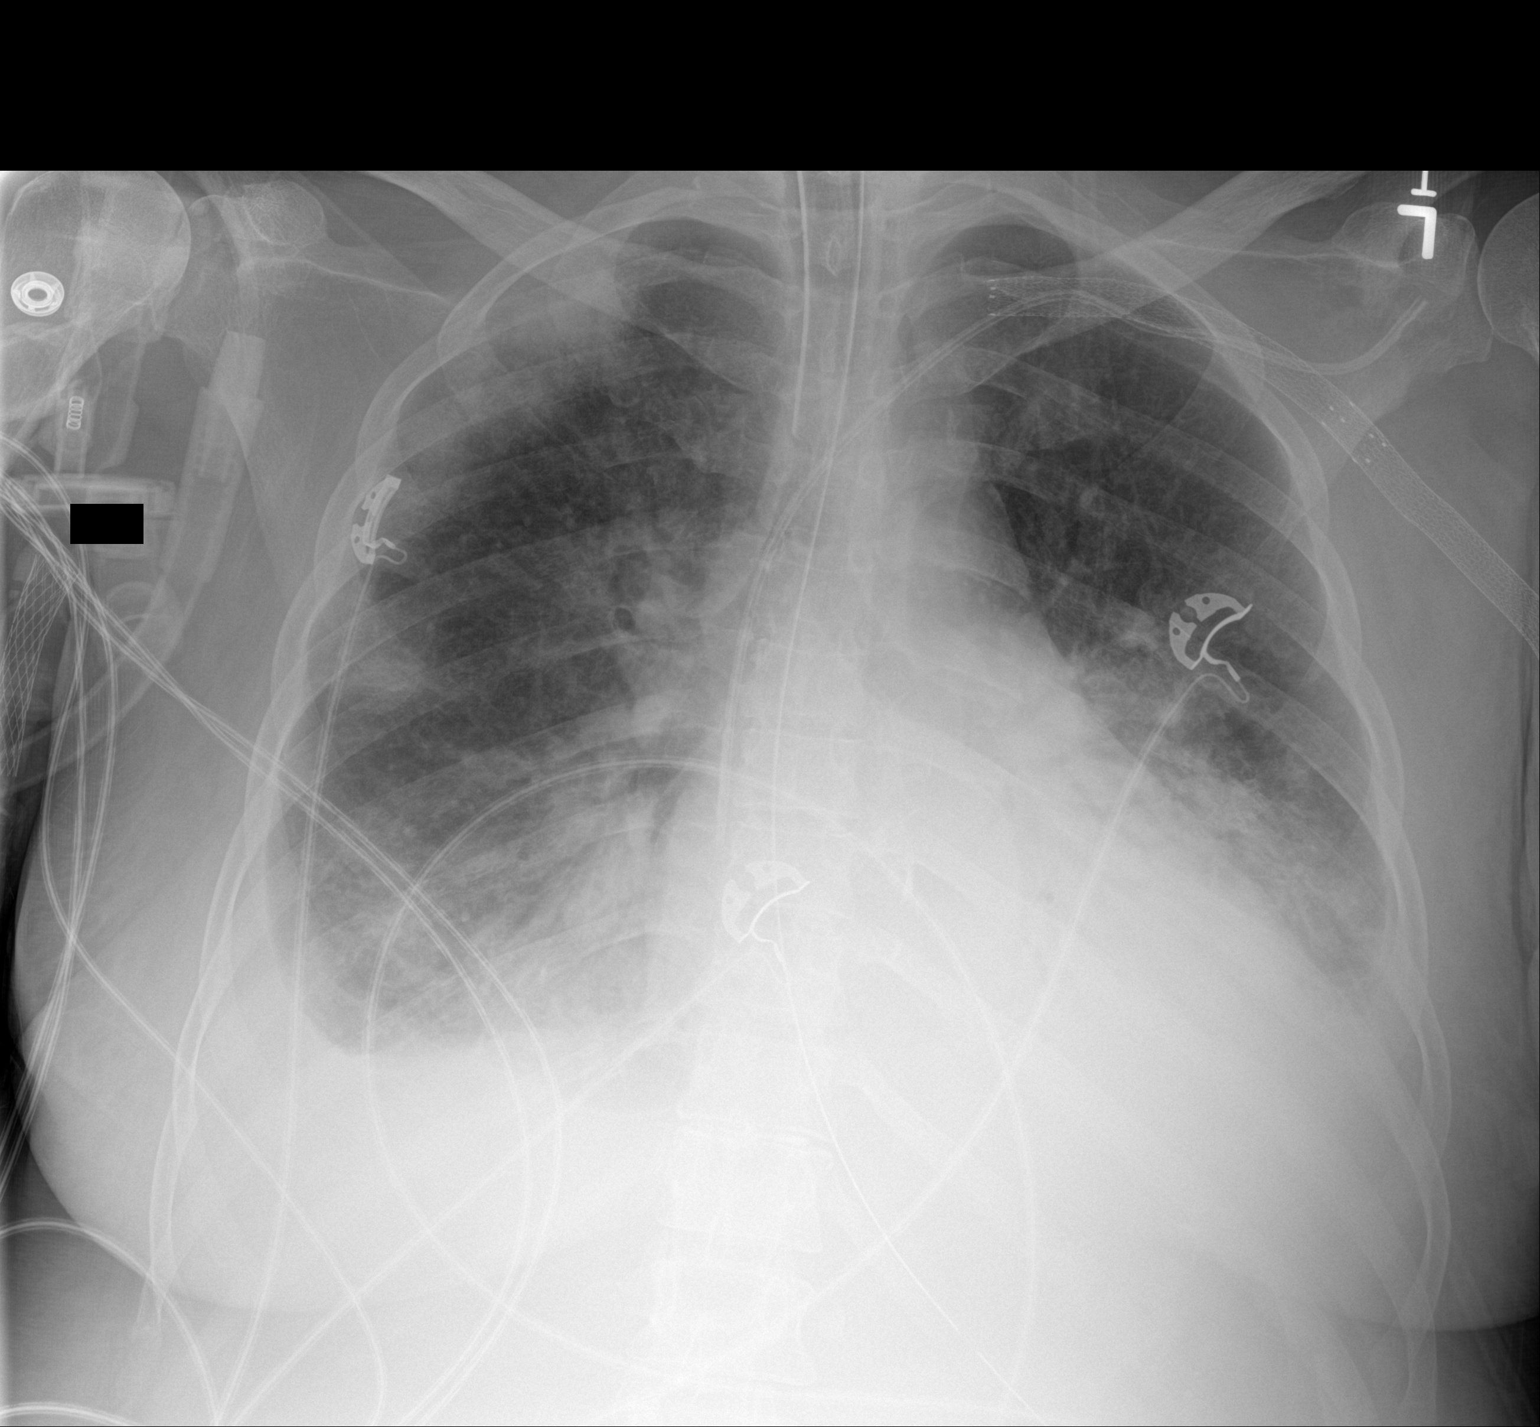

[1 of 1 positions shown; findings below may reference images not displayed]

FINDINGS: An endotracheal tube, left subclavian central venous catheter with
tip overlying the superior cavoatrial junction an NG tube entering
stomach again identified. A left subclavian/axillary stent is again
noted.

New peripheral right upper lobe opacities are identified.

Decreased diffuse bilateral airspace opacities noted.

Bilateral pleural effusions and bibasilar opacities,
left-greater-than-right, again noted.

There is no evidence of pneumothorax.
IMPRESSION: New peripheral right upper lobe opacities with decrease diffuse
bilateral airspace opacities/ edema otherwise.

## 2016-03-24 IMAGING — DX DG ABDOMEN 1V
1 series · 1 of 1 positions shown · non-contrast
Comparison: 11/14/2015

CLINICAL DATA: Feeding tube placement

EXAM:
ABDOMEN - 1 VIEW

[abdomen kub]
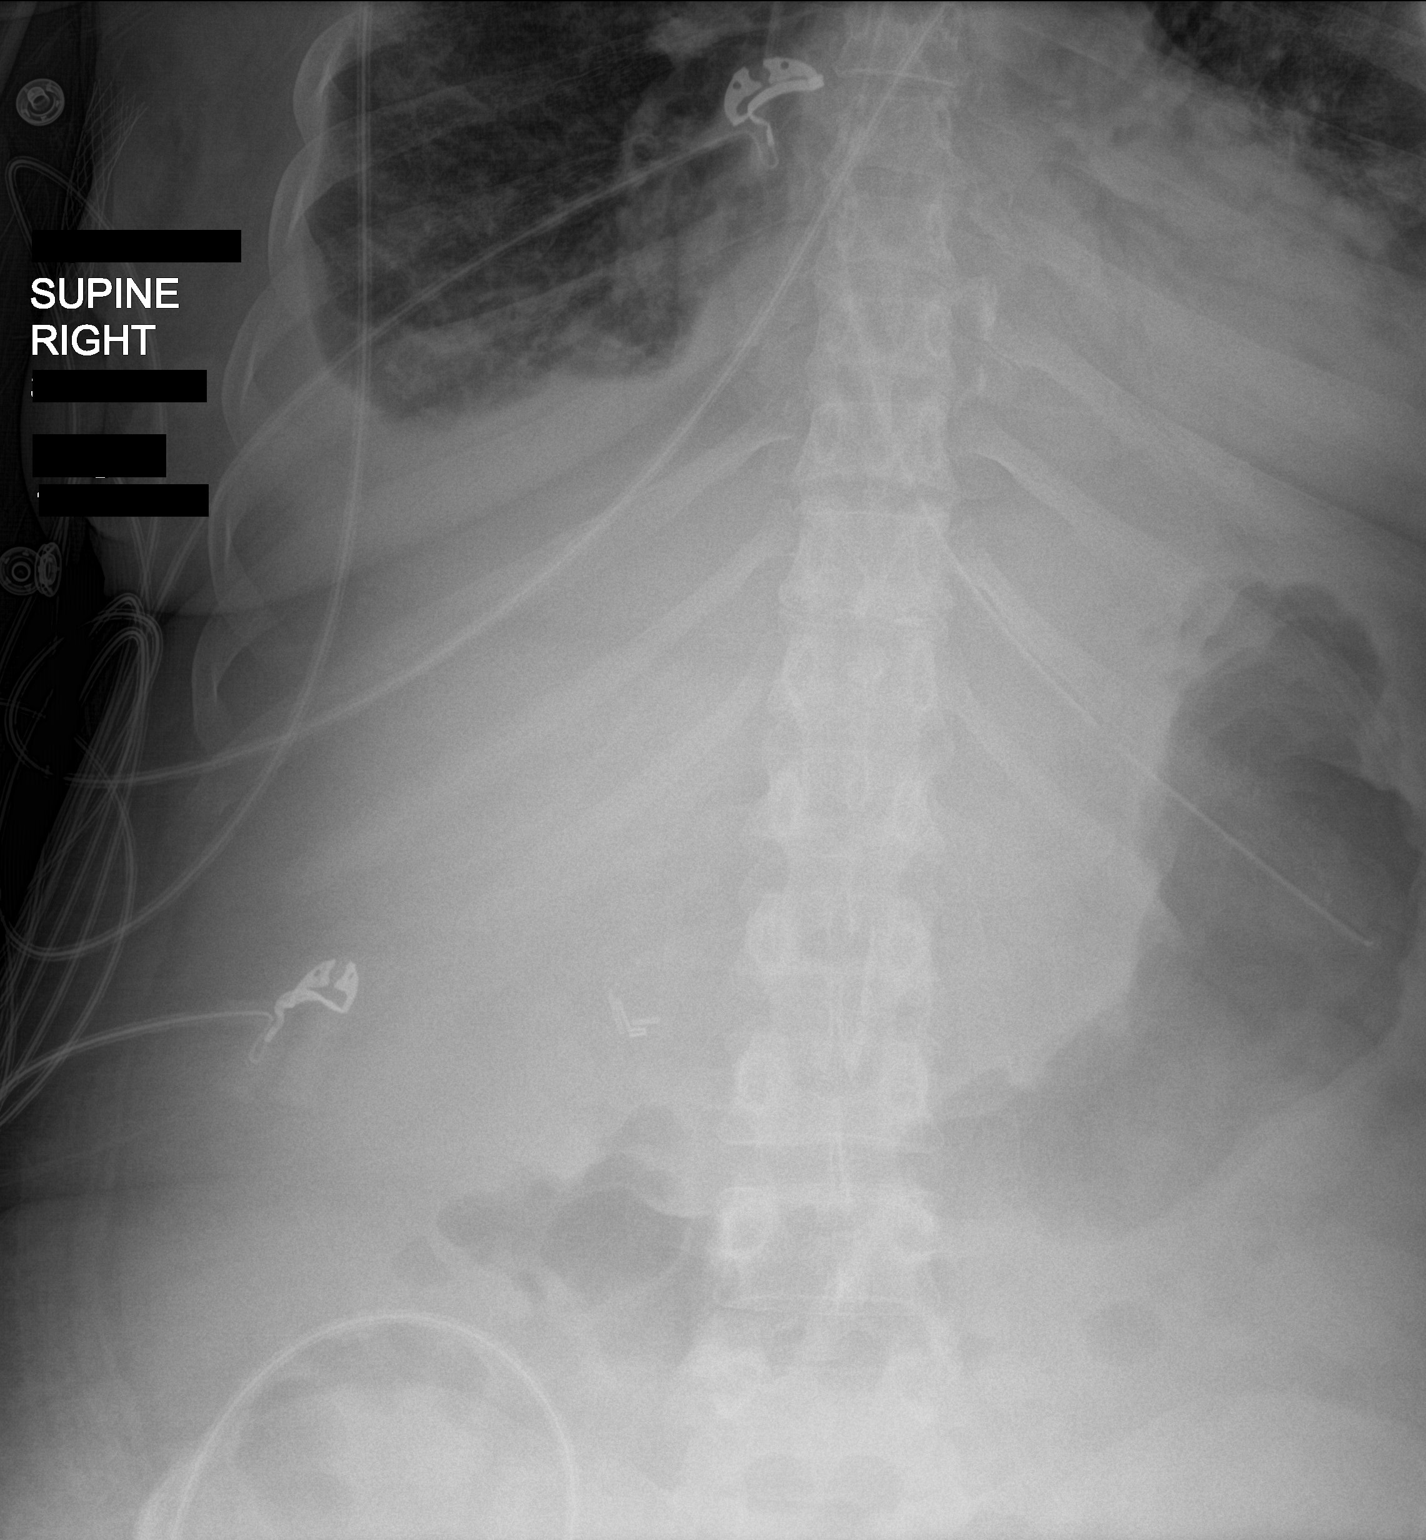

[1 of 1 positions shown; findings below may reference images not displayed]

FINDINGS: NG tube is in place with the tip in the mid stomach and the
side-port in the proximal to mid stomach. Prior cholecystectomy.
IMPRESSION: NG tube tip in the mid stomach.

## 2016-03-25 IMAGING — DX DG CHEST 1V PORT
1 series · 1 of 1 positions shown · non-contrast
Comparison: 11/17/2015

CLINICAL DATA: Respiratory failure.

EXAM:
PORTABLE CHEST 1 VIEW

[chest ap]
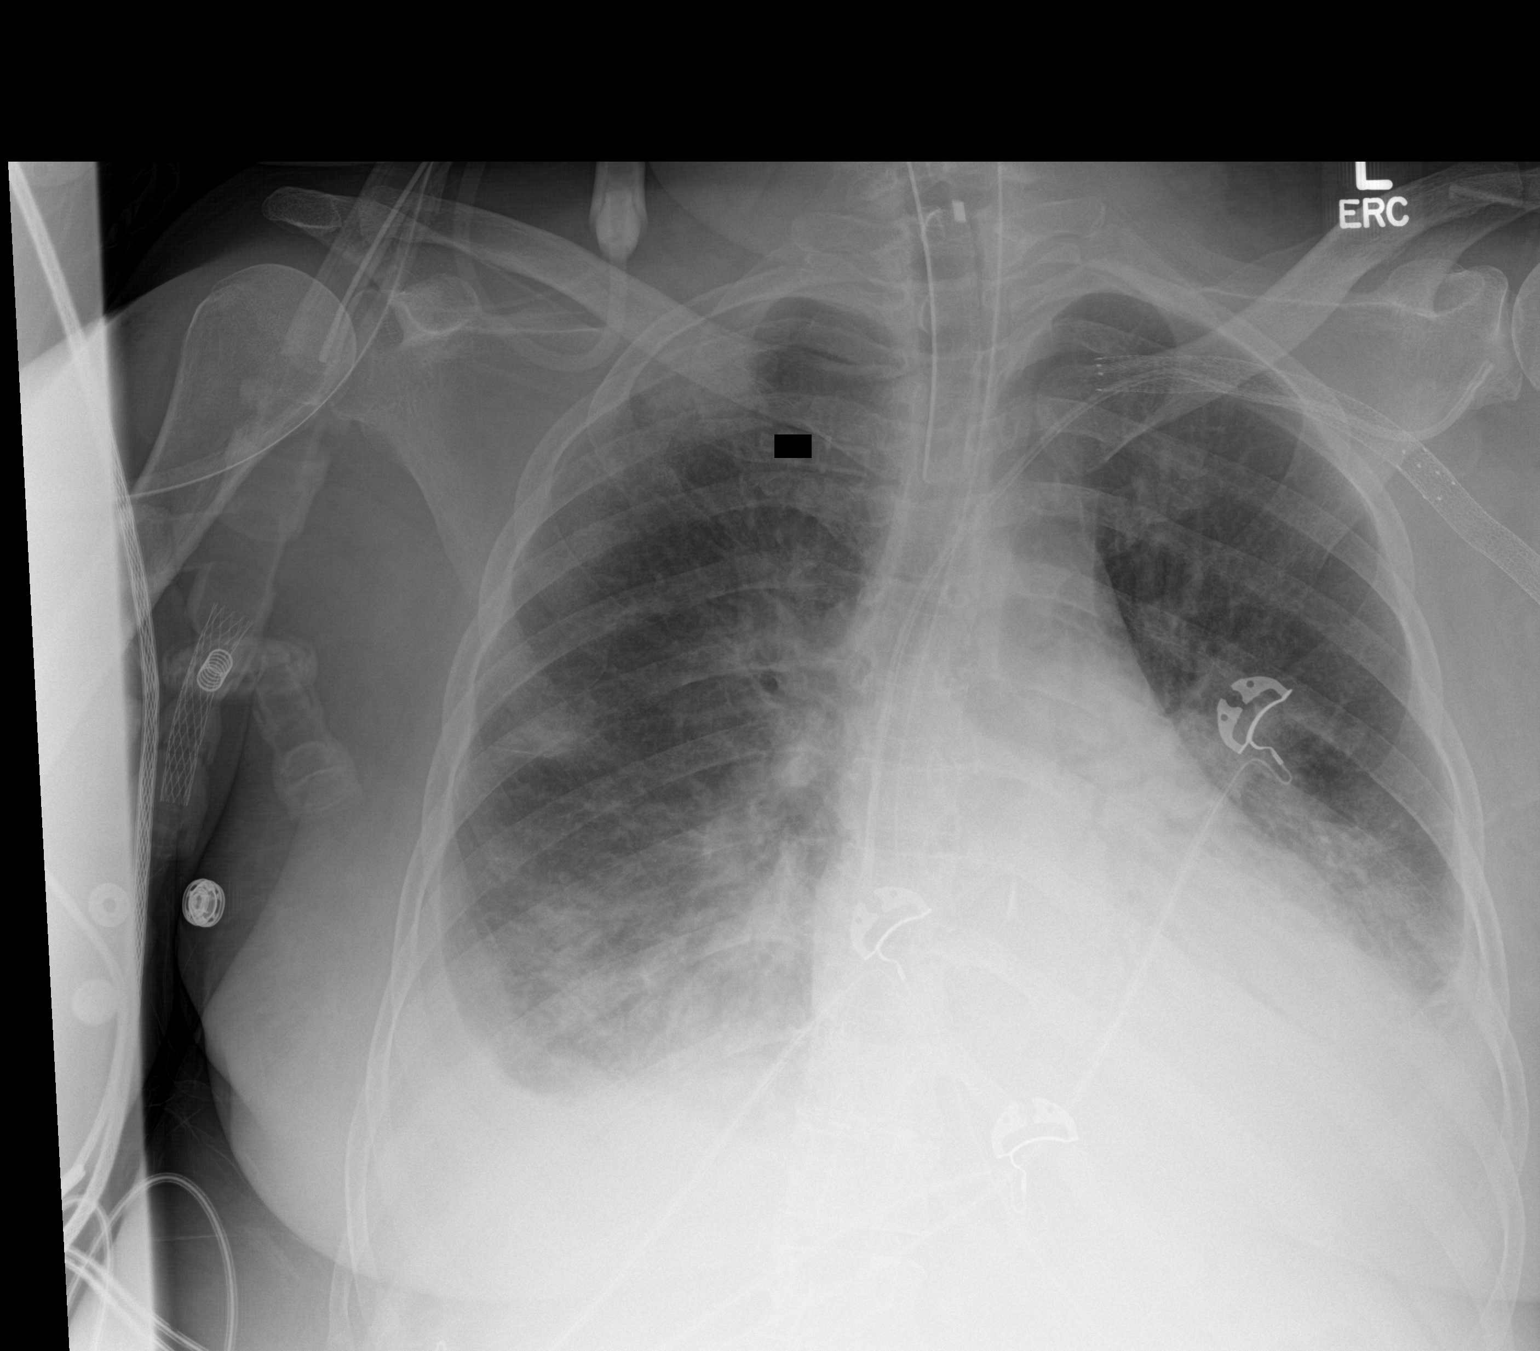

[1 of 1 positions shown; findings below may reference images not displayed]

FINDINGS: Small bilateral pleural effusions are again noted, stable.
Interstitial prominence throughout the lungs. Bilateral perihilar
and lower lobe opacities. Findings likely reflect edema/CHF. No real
change since prior study. Heart is borderline in size. Support
devices are unchanged.
IMPRESSION: Probable pulmonary edema/ CHF. Small layering effusions. No real
change since prior study.

## 2016-03-27 ENCOUNTER — Encounter: Payer: Self-pay | Admitting: Emergency Medicine

## 2016-03-27 ENCOUNTER — Emergency Department: Payer: Medicare Other

## 2016-03-27 ENCOUNTER — Emergency Department
Admission: EM | Admit: 2016-03-27 | Discharge: 2016-03-27 | Disposition: A | Discharge status: Home / Self Care | Payer: Medicare Other | Attending: Emergency Medicine | Admitting: Emergency Medicine

## 2016-03-27 DIAGNOSIS — I5031 Acute diastolic (congestive) heart failure: Secondary | ICD-10-CM | POA: Diagnosis not present

## 2016-03-27 DIAGNOSIS — J45909 Unspecified asthma, uncomplicated: Secondary | ICD-10-CM | POA: Diagnosis not present

## 2016-03-27 DIAGNOSIS — Z7982 Long term (current) use of aspirin: Secondary | ICD-10-CM | POA: Diagnosis not present

## 2016-03-27 DIAGNOSIS — M1990 Unspecified osteoarthritis, unspecified site: Secondary | ICD-10-CM | POA: Insufficient documentation

## 2016-03-27 DIAGNOSIS — Z87891 Personal history of nicotine dependence: Secondary | ICD-10-CM | POA: Diagnosis not present

## 2016-03-27 DIAGNOSIS — Z992 Dependence on renal dialysis: Secondary | ICD-10-CM | POA: Diagnosis not present

## 2016-03-27 DIAGNOSIS — E1122 Type 2 diabetes mellitus with diabetic chronic kidney disease: Secondary | ICD-10-CM | POA: Diagnosis not present

## 2016-03-27 DIAGNOSIS — I2510 Atherosclerotic heart disease of native coronary artery without angina pectoris: Secondary | ICD-10-CM | POA: Insufficient documentation

## 2016-03-27 DIAGNOSIS — Z79899 Other long term (current) drug therapy: Secondary | ICD-10-CM | POA: Insufficient documentation

## 2016-03-27 DIAGNOSIS — N186 End stage renal disease: Secondary | ICD-10-CM | POA: Diagnosis not present

## 2016-03-27 DIAGNOSIS — M62838 Other muscle spasm: Secondary | ICD-10-CM | POA: Insufficient documentation

## 2016-03-27 DIAGNOSIS — M79605 Pain in left leg: Secondary | ICD-10-CM | POA: Diagnosis not present

## 2016-03-27 DIAGNOSIS — I132 Hypertensive heart and chronic kidney disease with heart failure and with stage 5 chronic kidney disease, or end stage renal disease: Secondary | ICD-10-CM | POA: Insufficient documentation

## 2016-03-27 LAB — BASIC METABOLIC PANEL
Anion gap: 11 (ref 5–15)
BUN: 36 mg/dL — ABNORMAL HIGH (ref 6–20)
CO2: 29 mmol/L (ref 22–32)
Calcium: 8.6 mg/dL — ABNORMAL LOW (ref 8.9–10.3)
Chloride: 91 mmol/L — ABNORMAL LOW (ref 101–111)
Creatinine, Ser: 6.14 mg/dL — ABNORMAL HIGH (ref 0.44–1.00)
GFR calc Af Amer: 8 mL/min — ABNORMAL LOW (ref >60)
GFR calc non Af Amer: 7 mL/min — ABNORMAL LOW (ref >60)
Glucose, Bld: 63 mg/dL — ABNORMAL LOW (ref 65–99)
Potassium: 4.9 mmol/L (ref 3.5–5.1)
Sodium: 131 mmol/L — ABNORMAL LOW (ref 135–145)

## 2016-03-27 LAB — CBC WITH DIFFERENTIAL/PLATELET
Basophils Absolute: 0.1 10*3/uL (ref 0–0.1)
Basophils Relative: 2 %
Eosinophils Absolute: 0.7 10*3/uL (ref 0–0.7)
Eosinophils Relative: 9 %
HCT: 27.8 % — ABNORMAL LOW (ref 35.0–47.0)
Hemoglobin: 9.3 g/dL — ABNORMAL LOW (ref 12.0–16.0)
Lymphocytes Relative: 32 %
Lymphs Abs: 2.4 10*3/uL (ref 1.0–3.6)
MCH: 25 pg — ABNORMAL LOW (ref 26.0–34.0)
MCHC: 33.3 g/dL (ref 32.0–36.0)
MCV: 75 fL — ABNORMAL LOW (ref 80.0–100.0)
Monocytes Absolute: 0.8 10*3/uL (ref 0.2–0.9)
Monocytes Relative: 11 %
Neutro Abs: 3.5 10*3/uL (ref 1.4–6.5)
Neutrophils Relative %: 46 %
Platelets: 272 10*3/uL (ref 150–440)
RBC: 3.71 MIL/uL — ABNORMAL LOW (ref 3.80–5.20)
RDW: 22.4 % — ABNORMAL HIGH (ref 11.5–14.5)
WBC: 7.5 10*3/uL (ref 3.6–11.0)

## 2016-03-27 LAB — TROPONIN I: Troponin I: <0.03 ng/mL (ref <0.03)

## 2016-03-27 LAB — MAGNESIUM: Magnesium: 2.4 mg/dL (ref 1.7–2.4)

## 2016-03-27 MED ORDER — ASPIRIN 325 MG PO TABS
325.0000 mg | ORAL_TABLET | Freq: Once | ORAL | Status: AC
  Administered 2016-03-27: 325 mg via ORAL
  Filled 2016-03-27: qty 1

## 2016-03-27 MED ORDER — SODIUM CHLORIDE 0.9 % IV BOLUS (SEPSIS)
500.0000 mL | Freq: Once | INTRAVENOUS | Status: AC
  Administered 2016-03-27: 500 mL via INTRAVENOUS

## 2016-03-27 MED ORDER — FENTANYL CITRATE (PF) 100 MCG/2ML IJ SOLN
INTRAMUSCULAR | Status: AC
  Administered 2016-03-27: 50 ug via INTRAVENOUS
  Filled 2016-03-27: qty 2

## 2016-03-27 MED ORDER — FENTANYL CITRATE (PF) 100 MCG/2ML IJ SOLN
50.0000 ug | Freq: Once | INTRAMUSCULAR | Status: AC
  Administered 2016-03-27: 50 ug via INTRAVENOUS

## 2016-03-27 NOTE — ED Notes (Signed)
Pt taken off oxygen, O2 sat maintained between 97-98%.

## 2016-03-27 NOTE — Discharge Instructions (Signed)
Muscle Cramps and Spasms  Muscle cramps and spasms occur when a muscle or muscles tighten and you have no control over this tightening (involuntary muscle contraction). They are a common problem and can develop in any muscle. The most common place is in the calf muscles of the leg. Both muscle cramps and muscle spasms are involuntary muscle contractions, but they also have differences:   · Muscle cramps are sporadic and painful. They may last a few seconds to a quarter of an hour. Muscle cramps are often more forceful and last longer than muscle spasms.  · Muscle spasms may or may not be painful. They may also last just a few seconds or much longer.  CAUSES   It is uncommon for cramps or spasms to be due to a serious underlying problem. In many cases, the cause of cramps or spasms is unknown. Some common causes are:   · Overexertion.    · Overuse from repetitive motions (doing the same thing over and over).    · Remaining in a certain position for a long period of time.    · Improper preparation, form, or technique while performing a sport or activity.    · Dehydration.    · Injury.    · Side effects of some medicines.    · Abnormally low levels of the salts and ions in your blood (electrolytes), especially potassium and calcium. This could happen if you are taking water pills (diuretics) or you are pregnant.    Some underlying medical problems can make it more likely to develop cramps or spasms. These include, but are not limited to:   · Diabetes.    · Parkinson disease.    · Hormone disorders, such as thyroid problems.    · Alcohol abuse.    · Diseases specific to muscles, joints, and bones.    · Blood vessel disease where not enough blood is getting to the muscles.    HOME CARE INSTRUCTIONS   · Stay well hydrated. Drink enough water and fluids to keep your urine clear or pale yellow.  · It may be helpful to massage, stretch, and relax the affected muscle.  · For tight or tense muscles, use a warm towel, heating  pad, or hot shower water directed to the affected area.  · If you are sore or have pain after a cramp or spasm, applying ice to the affected area may relieve discomfort.    Put ice in a plastic bag.    Place a towel between your skin and the bag.    Leave the ice on for 15-20 minutes, 03-04 times a day.  · Medicines used to treat a known cause of cramps or spasms may help reduce their frequency or severity. Only take over-the-counter or prescription medicines as directed by your caregiver.  SEEK MEDICAL CARE IF:   Your cramps or spasms get more severe, more frequent, or do not improve over time.   MAKE SURE YOU:   · Understand these instructions.  · Will watch your condition.  · Will get help right away if you are not doing well or get worse.     This information is not intended to replace advice given to you by your health care provider. Make sure you discuss any questions you have with your health care provider.     Document Released: 02/28/2002 Document Revised: 01/03/2013 Document Reviewed: 08/25/2012  [employer]vier Interactive Patient Education ©2016 [employer]

## 2016-03-27 NOTE — ED Notes (Signed)
EMS states pt is complaining of cramping to bilateral lower extremities. Pt states this AM she had chest pain for short time and took two Nitro and pain went away. Pt is a dialysis pt but missed her treatment yesterday.

## 2016-03-27 NOTE — ED Provider Notes (Signed)
[facility] Emergency Department Provider Note  Time seen: 3:16 PM  I have reviewed the triage vital signs and the nursing notes.   HISTORY  Chief Complaint Leg Pain    HPI [name] is a 55 y.o. female with a past medical history of asthma diabetes, hypertension, diabetic neuropathy, MI, COPD, end-stage renal disease on hemodialysis Monday, Wednesday, Friday, who presents the emergency department with leg spasms. According to the patient for the past 2 days she has been experiencing significant leg cramps and spasms. States she has had them on and off for many months but they've been much worse over the past 48 hours. Patient also notes several episodes of vomiting and diarrhea yesterday. She did not go to dialysis yesterday due to leg spasms. Son brought her to the emergency department today for evaluation. Patient also states mild chest pain earlier today but states this is fairly common for her. Denies any shortness of breath or chest pain at this time. Describes her leg cramps as intermittent lasting seconds to minutes, but when they occur they are very severe sharp/cramping pain 10/10 in severity.     Past Medical History  Diagnosis Date  . Asthma   . Collagen vascular disease ([place])   . Diabetes mellitus without complication ([place])   . Hypertension   . Coronary artery disease     a. cath 2013: stenting to RCA (report not available); b. cath 2014: LM nl, pLAD 40%, mLAD nl, ost LCx 40%, mid LCx nl, pRCA 30% @ site of prior stent, mRCA 50%  . Diabetic neuropathy ([place])   . ESRD (end stage renal disease) on dialysis ([place])     M-W-F  . GERD (gastroesophageal reflux disease)   . Hx of pancreatitis 2015  . Myocardial infarction ([place])   . Pneumonia   . Mitral regurgitation     a. echo 10/2013: EF 62%, noWMA, mildly dilated LA, mild to mod MR/TR, GR1DD  . COPD (chronic obstructive pulmonary disease) ([place])   . chronic diastolic CHF [id number]  . dialysis 2006   . Depression   . Anxiety   . Arthritis   . Headache   . Anemia   . Renal insufficiency     Patient Active Problem List   Diagnosis Date Noted  . Blood in stool   . Intractable cyclical vomiting with nausea   . Reflux esophagitis   . Gastritis   . Generalized abdominal pain   . Uncontrollable vomiting   . Major depressive disorder, recurrent episode, moderate ([place]) 03/15/2016  . Adjustment disorder with mixed anxiety and depressed mood 03/15/2016  . Malnutrition of moderate degree 12/01/2015  . Renal mass   . Dyspnea   . Acute renal failure ([place])   . Respiratory failure ([place])   . High temperature 11/14/2015  . Pulmonary edema   . Encounter for central line placement   . Encounter for orogastric (OG) tube placement   . Nausea 11/12/2015  . Hyperkalemia 10/03/2015  . C. difficile diarrhea 07/22/2015  . Pneumonia 05/21/2015  . Hypoglycemia 04/24/2015  . Unresponsiveness 04/24/2015  . Bradycardia 04/24/2015  . Hypothermia 04/24/2015  . Acute respiratory failure ([place]) 04/24/2015  . Acute diastolic CHF (congestive heart failure) ([place]) 04/05/2015  . Diabetic gastroparesis ([place]) 04/05/2015  . Hypokalemia 04/05/2015  . Generalized weakness 04/05/2015  . Acute pulmonary edema ([place]) 04/03/2015  . Nausea and vomiting 04/03/2015  . Hypoglycemia associated with diabetes ([place]) 04/03/2015  . Anemia of chronic disease 04/03/2015  . Secondary  hyperparathyroidism ([place]) 04/03/2015  . Pressure ulcer 04/02/2015  . Acute respiratory failure with hypoxia ([place]) 04/01/2015  . Adjustment disorder with anxiety 03/14/2015  . Somatic symptom disorder, mild 03/08/2015  . Coronary artery disease involving native coronary artery of native heart without angina pectoris   . Nausea & vomiting 03/06/2015  . Abdominal pain 03/06/2015  . DM (diabetes mellitus) ([place]) 03/06/2015  . HTN (hypertension) 03/06/2015  . Gastroparesis 02/24/2015  . Pleural effusion 02/19/2015  . HCAP (healthcare-associated  pneumonia) 02/19/2015  . End-stage renal disease on hemodialysis ([place]) 02/19/2015    Past Surgical History  Procedure Laterality Date  . Cholecystectomy    . Appendectomy    . Abdominal hysterectomy    . Dialysis fistula creation Left     upper arm  . Esophagogastroduodenoscopy N/A 03/08/2015    Procedure: ESOPHAGOGASTRODUODENOSCOPY (EGD);  Surgeon: Manya Silvas, MD;  Location: [facility] ENDOSCOPY;  Service: Endoscopy;  Laterality: N/A;  . Cardiac catheterization Left 07/26/2015    Procedure: Left Heart Cath and Coronary Angiography;  Surgeon: Dionisio David, MD;  Location: [place] CV LAB;  Service: Cardiovascular;  Laterality: Left;  Marland Kitchen Eye surgery    . Fecal transplant N/A 08/23/2015    Procedure: FECAL TRANSPLANT;  Surgeon: Manya Silvas, MD;  Location: [facility] ENDOSCOPY;  Service: Endoscopy;  Laterality: N/A;  . Peripheral vascular catheterization N/A 12/20/2015    Procedure: Thrombectomy of dialysis access versus permcath placement;  Surgeon: Algernon Huxley, MD;  Location: [place] CV LAB;  Service: Cardiovascular;  Laterality: N/A;  . Peripheral vascular catheterization N/A 12/20/2015    Procedure: A/V Shunt Intervention;  Surgeon: Algernon Huxley, MD;  Location: [place] CV LAB;  Service: Cardiovascular;  Laterality: N/A;  . Peripheral vascular catheterization N/A 12/20/2015    Procedure: A/V Shuntogram/Fistulagram;  Surgeon: Algernon Huxley, MD;  Location: [place] CV LAB;  Service: Cardiovascular;  Laterality: N/A;  . Peripheral vascular catheterization N/A 01/02/2016    Procedure: A/V Shuntogram/Fistulagram;  Surgeon: Algernon Huxley, MD;  Location: [place] CV LAB;  Service: Cardiovascular;  Laterality: N/A;  . Peripheral vascular catheterization N/A 01/02/2016    Procedure: A/V Shunt Intervention;  Surgeon: Algernon Huxley, MD;  Location: [place] CV LAB;  Service: Cardiovascular;  Laterality: N/A;  . Esophagogastroduodenoscopy (egd) with propofol N/A 03/18/2016     Procedure: ESOPHAGOGASTRODUODENOSCOPY (EGD) WITH PROPOFOL;  Surgeon: [name], MD;  Location: [facility] ENDOSCOPY;  Service: Endoscopy;  Laterality: N/A;    Current Outpatient Rx  Name  Route  Sig  Dispense  Refill  . acidophilus (RISAQUAD) CAPS capsule   Oral   Take 1 capsule by mouth daily.         Marland Kitchen albuterol (PROVENTIL) (2.5 MG/3ML) 0.083% nebulizer solution   Nebulization   Take 2.5 mg by nebulization every 4 (four) hours as needed for wheezing or shortness of breath.         Marland Kitchen amLODipine (NORVASC) 10 MG tablet   Oral   Take 10 mg by mouth daily.         Marland Kitchen aspirin EC 81 MG tablet   Oral   Take 81 mg by mouth every morning.          Marland Kitchen atorvastatin (LIPITOR) 20 MG tablet   Oral   Take 20 mg by mouth every evening.         . B COMPLEX-C-FOLIC ACID PO   Oral   Take 1 tablet by mouth daily.         Marland Kitchen  bisacodyl (DULCOLAX) 5 MG EC tablet   Oral   Take 1 tablet (5 mg total) by mouth daily as needed for moderate constipation.   30 tablet   0   . cetirizine (ZYRTEC) 10 MG tablet   Oral   Take 10 mg by mouth every evening.          . cholecalciferol (VITAMIN D) 1000 units tablet   Oral   Take 1,000 Units by mouth daily.         . citalopram (CELEXA) 20 MG tablet   Oral   Take 20 mg by mouth every morning.          . clopidogrel (PLAVIX) 75 MG tablet   Oral   Take 75 mg by mouth daily. Reported on 12/17/2015         . hydrALAZINE (APRESOLINE) 25 MG tablet   Oral   Take 25 mg by mouth 3 (three) times daily.         Marland Kitchen HYDROcodone-acetaminophen (NORCO/VICODIN) 5-325 MG tablet   Oral   Take 1 tablet by mouth every 6 (six) hours as needed for moderate pain or severe pain.   20 tablet   0   . lipase/protease/amylase (CREON) 12000 UNITS CPEP capsule   Oral   Take 12,000-36,000 Units by mouth See admin instructions. Take 3 capsules (36,000 units) by mouth every morning, Take 2 capsules (24,000 units) by mouth at lunch, Take 3 capsules (36,000 units)  by mouth every night at bedtime, Then 1 capsule by mouth with snack.         . metoCLOPramide (REGLAN) 5 MG tablet   Oral   Take 1 tablet (5 mg total) by mouth 4 (four) times daily -  before meals and at bedtime.   120 tablet   2   . multivitamin (RENA-VIT) TABS tablet   Oral   Take 1 tablet by mouth daily.         . nitroGLYCERIN (NITROSTAT) 0.4 MG SL tablet   Sublingual   Place 0.4 mg under the tongue every 5 (five) minutes x 3 doses as needed for chest pain. *Max 3 doses per episode*         . omega-3 acid ethyl esters (LOVAZA) 1 g capsule   Oral   Take 1 g by mouth daily.         Marland Kitchen oxymetazoline (AFRIN) 0.05 % nasal spray   Each Nare   Place 2 sprays into both nostrils 2 (two) times daily.   15 mL   0   . pantoprazole (PROTONIX) 40 MG tablet   Oral   Take 1 tablet (40 mg total) by mouth 2 (two) times daily before a meal.   60 tablet   2   . promethazine (PHENERGAN) 25 MG tablet   Oral   Take 25 mg by mouth every 6 (six) hours as needed for nausea or vomiting.         . ranitidine (ZANTAC) 150 MG tablet   Oral   Take 150 mg by mouth every evening.          . ranolazine (RANEXA) 1000 MG SR tablet   Oral   Take 1,000 mg by mouth 2 (two) times daily.         . sevelamer carbonate (RENVELA) 800 MG tablet   Oral   Take 800 mg by mouth 3 (three) times daily with meals.          . vancomycin (VANCOCIN) 50 mg/mL  oral solution   Oral   Take 2.5 mLs (125 mg total) by mouth every 6 (six) hours. X 8 more days   50 mL   0   . vitamin B-12 (CYANOCOBALAMIN) 1000 MCG tablet   Oral   Take 1,000 mcg by mouth daily.           Allergies Compazine; Ace inhibitors; Ativan; Codeine; Dilaudid; Gabapentin; Losartan; Ondansetron; Prochlorperazine; Scopolamine; Zofran; Oxycodone; and Tape  Family History  Problem Relation Age of Onset  . Kidney disease Mother   . Diabetes Mother     Social History Social History  Substance Use Topics  . Smoking  status: Former Smoker -- 0.50 packs/day    Types: Cigarettes    Quit date: 02/13/2015  . Smokeless tobacco: Never Used  . Alcohol Use: No    Review of Systems Constitutional: Negative for fever Cardiovascular: Positive for chest pain, now resolved. Respiratory: Negative for shortness of breath. Gastrointestinal: Negative for abdominal pain Musculoskeletal: Negative for back pain.Positive for leg spasms/cramps. Skin: Negative for rash. Neurological: Negative for headache 10-point ROS otherwise negative.  ____________________________________________   PHYSICAL EXAM:  VITAL SIGNS: ED Triage Vitals  Enc Vitals Group     BP 03/27/16 1417 142/60 mmHg     Pulse Rate 03/27/16 1417 67     Resp 03/27/16 1417 16     Temp 03/27/16 1417 97.6 F (36.4 C)     Temp Source 03/27/16 1417 Oral     SpO2 03/27/16 1417 88 %     Weight 03/27/16 1417 160 lb (72.576 kg)     Height 03/27/16 1417 5\' 5"  (1.651 m)     Head Cir --      Peak Flow --      Pain Score 03/27/16 1414 9     Pain Loc --      Pain Edu? --      Excl. in [place]? --     Constitutional: Alert and oriented. Well appearing and in no distress. Eyes: Normal exam ENT   Head: Normocephalic and atraumatic.   Mouth/Throat: Mucous membranes are moist. Cardiovascular: Normal rate, regular rhythm. No murmur Respiratory: Normal respiratory effort without tachypnea nor retractions. Breath sounds are clear Gastrointestinal: Soft and nontender. No distention.  Musculoskeletal: Nontender with normal range of motion in all extremities. No leg tenderness to palpation. 2+ DP pulse bilaterally. Sensation intact. No lower extremity edema. Neurologic:  Normal speech and language. No gross focal neurologic deficits  Skin:  Skin is warm, dry and intact.  Psychiatric: Mood and affect are normal.  ____________________________________________    EKG  EKG reviewed and interpreted by myself shows normal sinus rhythm at 66 bpm, slightly  widened QRS, normal axis, largely normal intervals besides slight prolongation of the QTC. Nonspecific but no concerning ST changes. No ST elevations.  ____________________________________________    RADIOLOGY  Chest x-ray shows pleural effusions with vascular congestion but no pulmonary edema.  ____________________________________________    INITIAL IMPRESSION / ASSESSMENT AND PLAN / ED COURSE  Pertinent labs & imaging results that were available during my care of the patient were reviewed by me and considered in my medical decision making (see chart for details).  Patient presents the emergency department lower leg spasms/cramps. States they've been severe for the past 2 days. They occur intermittently, occurred multiple times during her evaluation in which the patient appeared to be in significant pain for several seconds and then resolved. We will check labs including magnesium, calcium, potassium. We will check a  troponin given the patient's complaint of chest pain earlier today as well as a chest x-ray. We will dose IV Ativan, and 500 cc of IV fluids, close monitoring while awaiting results. Patient did miss dialysis yesterday. Depending on her lab values we will discuss with nephrology if the patient needs urgent dialysis.  Chest x-ray shows vascular congestion or pleural effusions but no pulmonary edema. Labs are largely within normal limits with a potassium of 4.9. Calcium is at baseline, magnesium of 2.4. Patient has a 97% room air oxygen saturation currently with a good waveform. Overall the patient appears well we'll dose fentanyl for pain control for her leg spasms. The patient takes Norco and morphine sulfate at home for pain per patient. With everything looking at her baseline we will discharge the patient home with the plan for her to follow-up with her primary care doctor soon as possible and proceed to her normal dialysis session tomorrow morning. I discussed return precautions,  and the patient is agreeable to this plan.  ____________________________________________   FINAL CLINICAL IMPRESSION(S) / ED DIAGNOSES  Leg spasms/cramps Chest pain   Harvest Dark, MD 03/27/16 [id number]

## 2016-03-28 IMAGING — CT CT ANGIO CHEST
1 of 2 series · 18 of 30 positions shown · IV contrast (APPLIED)
Comparison: Chest CT 10/10/2015

CLINICAL DATA: Blood in sputum. Concern for pulmonary embolism.
Dialysis patient. COPD, asthma, and coronary disease.

EXAM:
CT ANGIOGRAPHY CHEST WITH CONTRAST
TECHNIQUE: Multidetector CT imaging of the chest was performed using the
standard protocol during bolus administration of intravenous
contrast. Multiplanar CT image reconstructions and MIPs were
obtained to evaluate the vascular anatomy.
CONTRAST:  75mL OMNIPAQUE IOHEXOL 350 MG/ML SOLN

[Series 5: pe 1.0 thins · axial · 0.68mm/px · z∈[-443,-200]mm · 18 of 275 slices shown]
[im 16/275  lung]
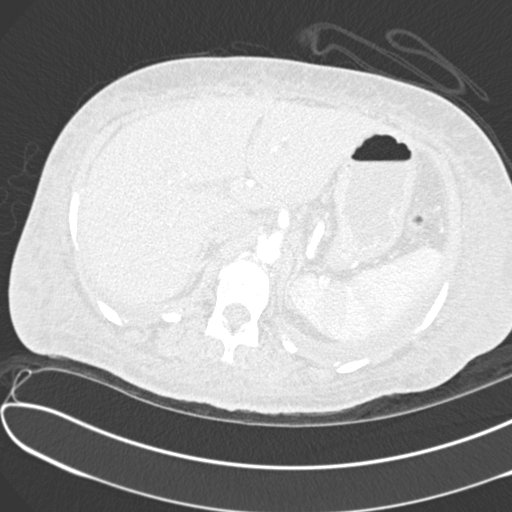
[im 31/275  mediastinal]
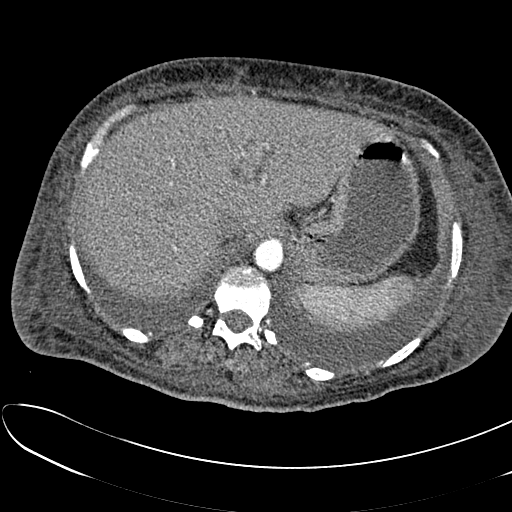
[im 46/275  lung]
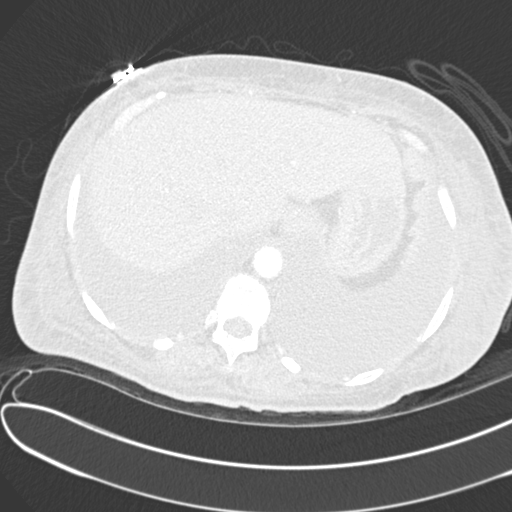
[im 61/275  mediastinal]
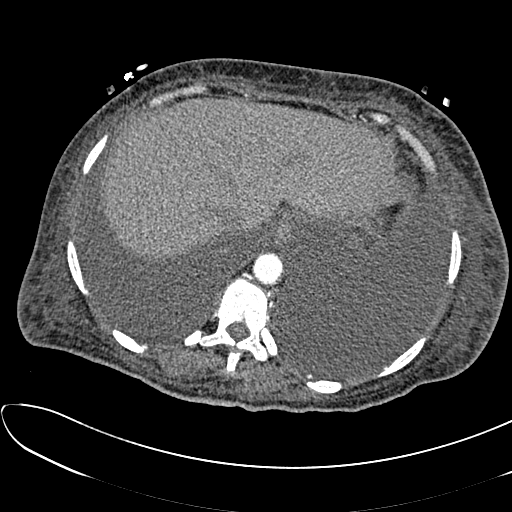
[im 77/275  lung]
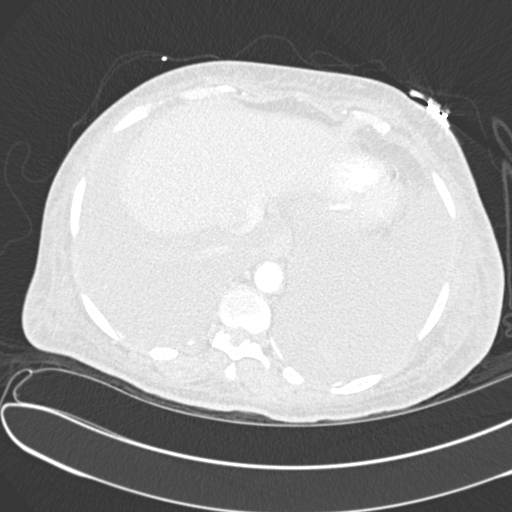
[im 92/275  mediastinal]
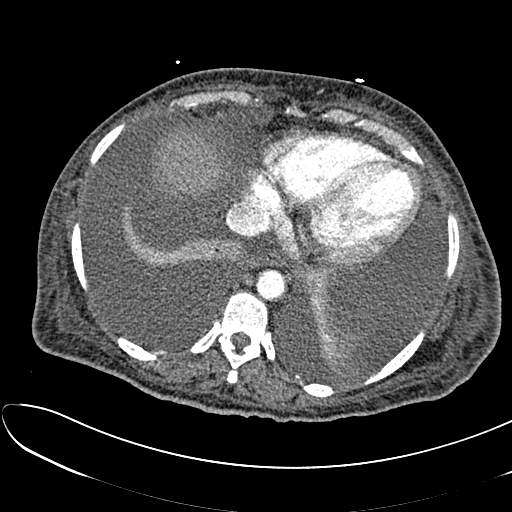
[im 107/275  lung]
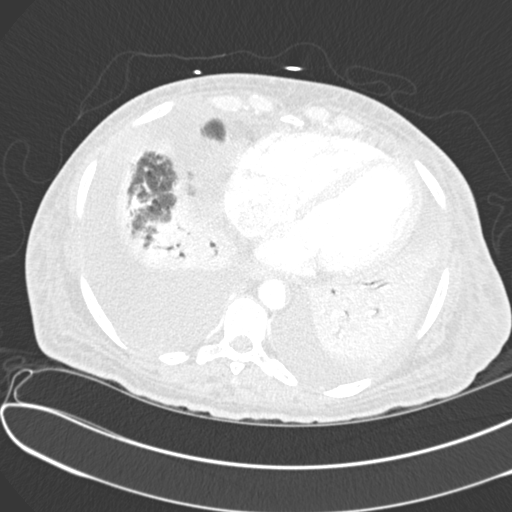
[im 122/275  mediastinal]
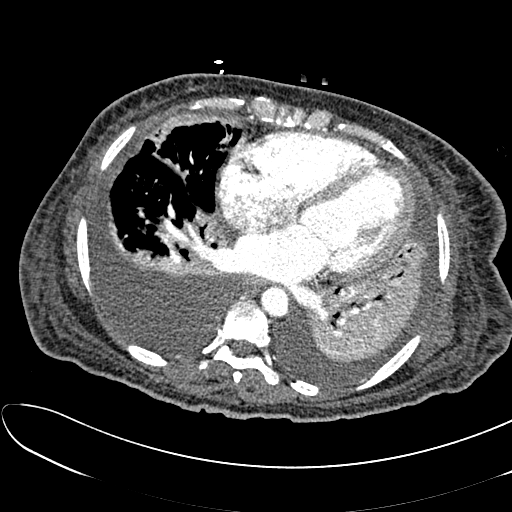
[im 128/275  lung]
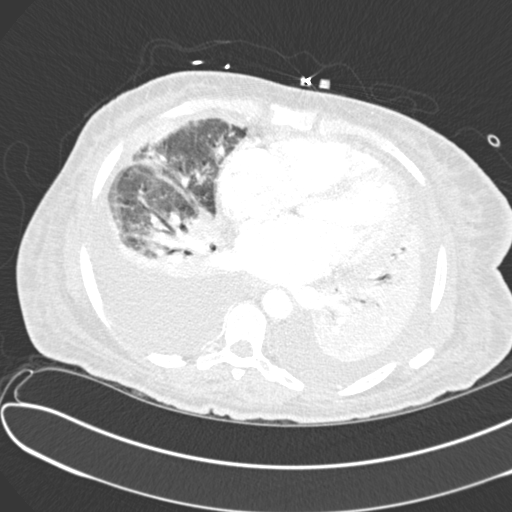
[im 138/275  mediastinal]
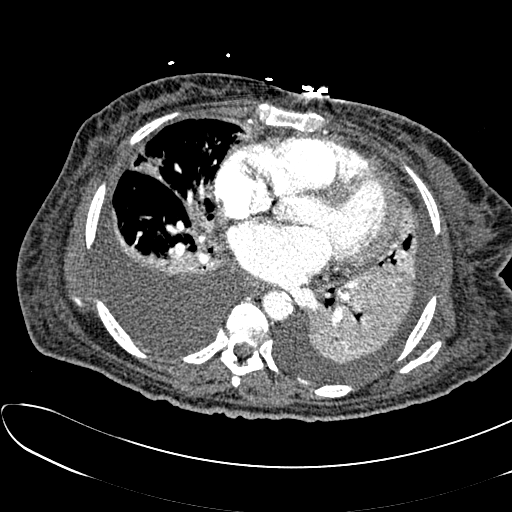
[im 153/275  lung]
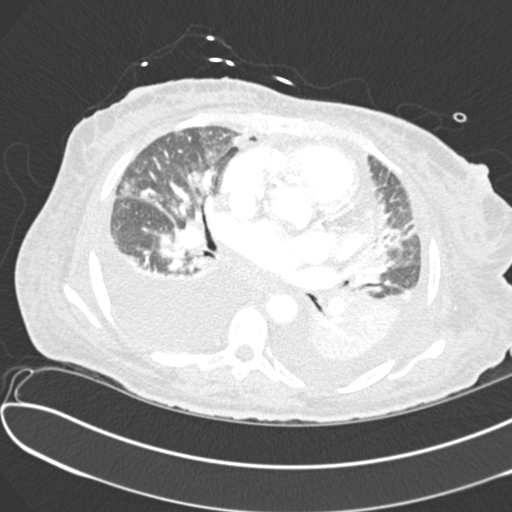
[im 168/275  mediastinal]
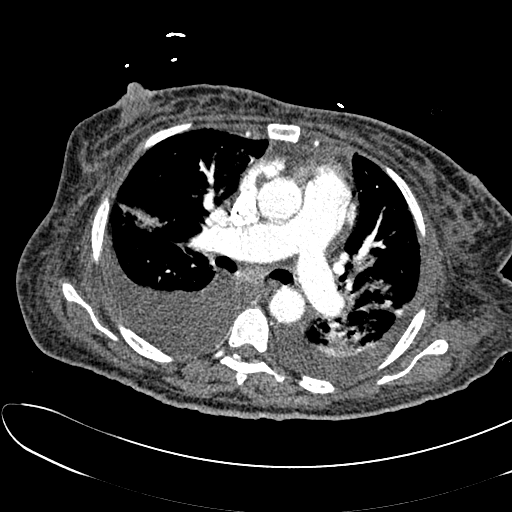
[im 183/275  lung]
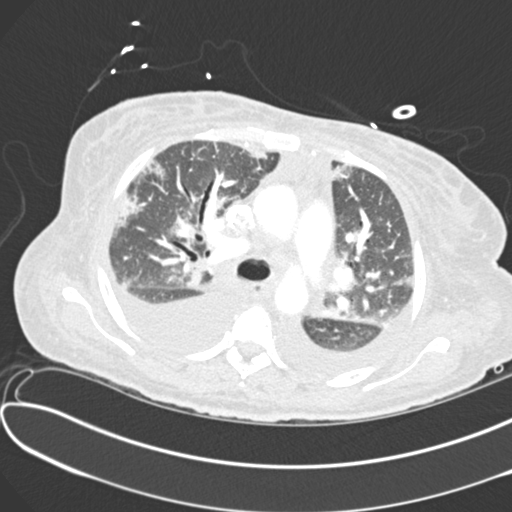
[im 198/275  mediastinal]
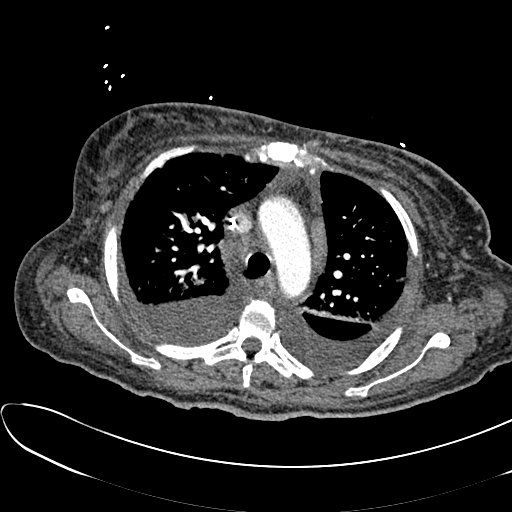
[im 214/275  lung]
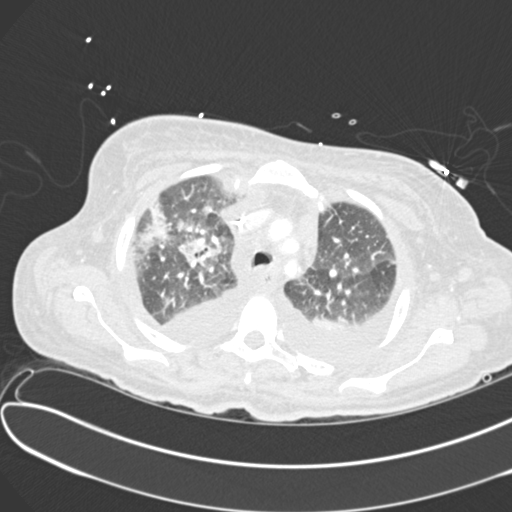
[im 229/275  mediastinal]
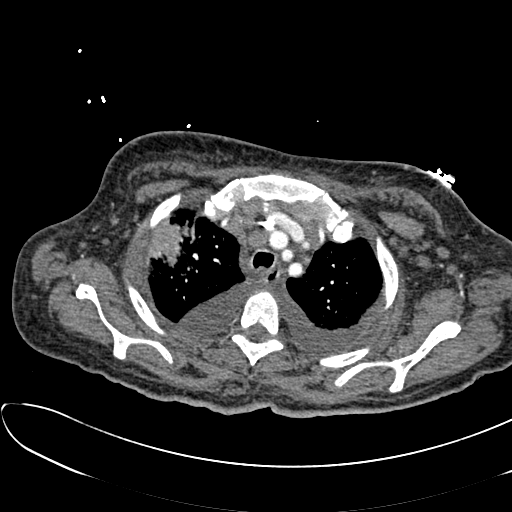
[im 244/275  lung]
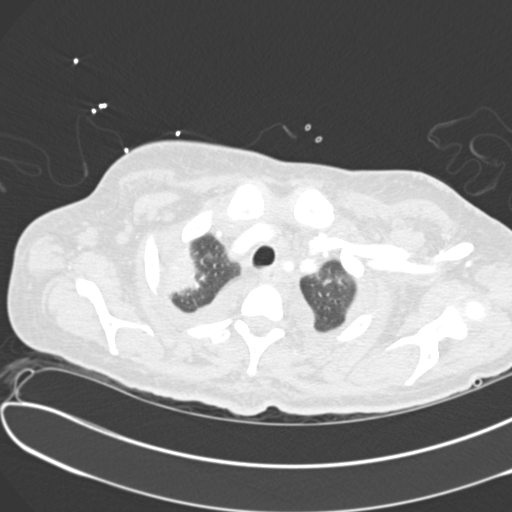
[im 259/275  mediastinal]
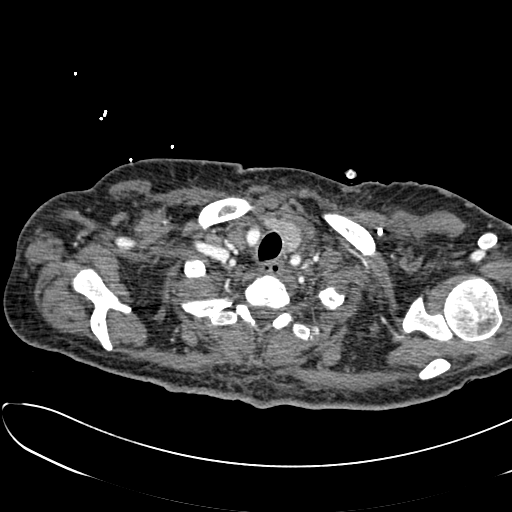

[18 of 30 positions shown; findings below may reference images not displayed]

FINDINGS: Mediastinum/Nodes: No filling defects within the pulmonary to
suggest acute pulmonary embolism. No acute findings aorta or great
vessels. No pericardial effusion.

Mild mediastinal lymphadenopathy. No axillary or supraclavicular
adenopathy.

Lungs/Pleura: Large bilateral layering pleural effusions. Passive
atelectasis of the LEFT and RIGHT lower lobe. There is patchy
airspace disease in the RIGHT upper lobe which reaches confluence at
the apex with masslike consolidation measuring 3.5 by 2.6 cm (image
20, series 6). Some patchy airspace disease in the inferior lingula
additionally.

Upper abdomen: Limited view of the liver, kidneys, pancreas are
unremarkable. Normal adrenal glands.

Musculoskeletal: Diffuse sclerosis the bones.  Acute findings.

Review of the MIP images confirms the above findings.
IMPRESSION: 1. No acute pulmonary embolism.
2. Large bilateral pleural effusions and lower lobe atelectasis.
3. Bilateral patchy airspace disease suggesting MULTIFOCAL PNEUMONIA
versus aspiration pneumonitis.
4. Consolidation in the RIGHT upper low most suggestive of pulmonary
infection.
5. Mild mediastinal adenopathy favored reactive.

## 2016-03-29 IMAGING — RF DG SWALLOWING FUNCTION - NRPT [facility]
13 series · 13 of 24 positions shown · non-contrast
Comparison: none

[Series 1: run · 1 of 98 frames shown (1 of 13)]
[frame 15/98]
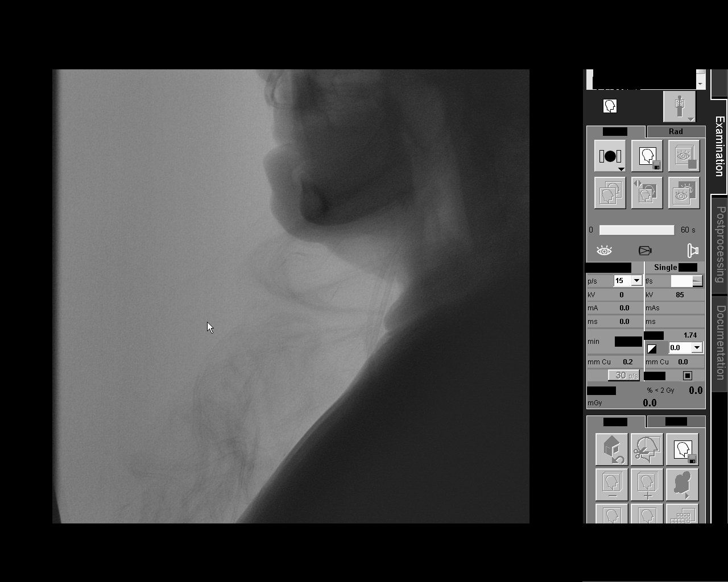

[Series 2: run · 1 of 25 frames shown (2 of 13)]
[frame 4/25]
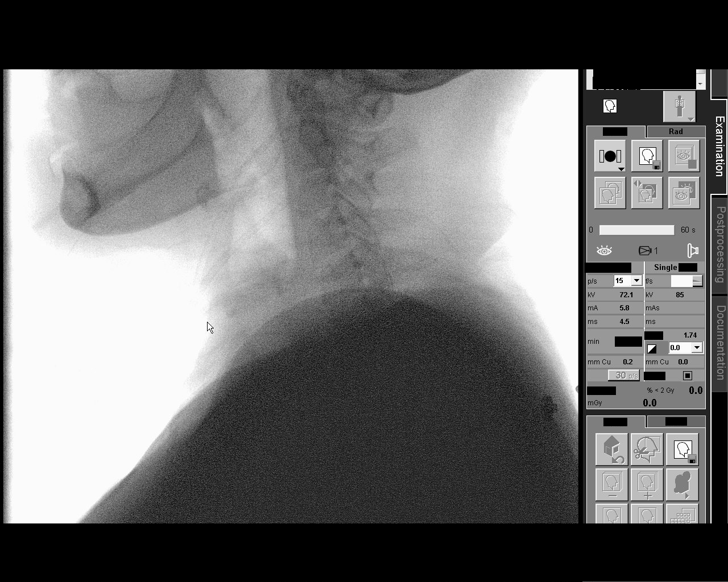

[Series 3: run · 1 of 29 frames shown (3 of 13)]
[frame 15/29]
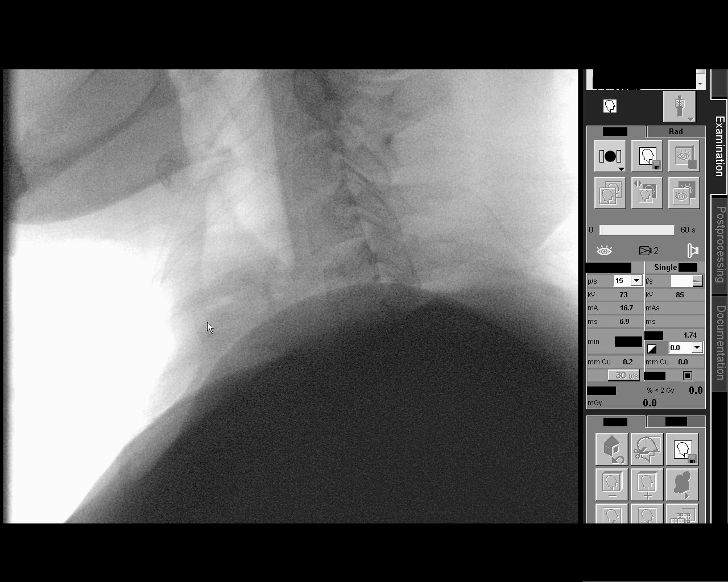

[Series 4: run · 1 of 218 frames shown (4 of 13)]
[frame 186/218]
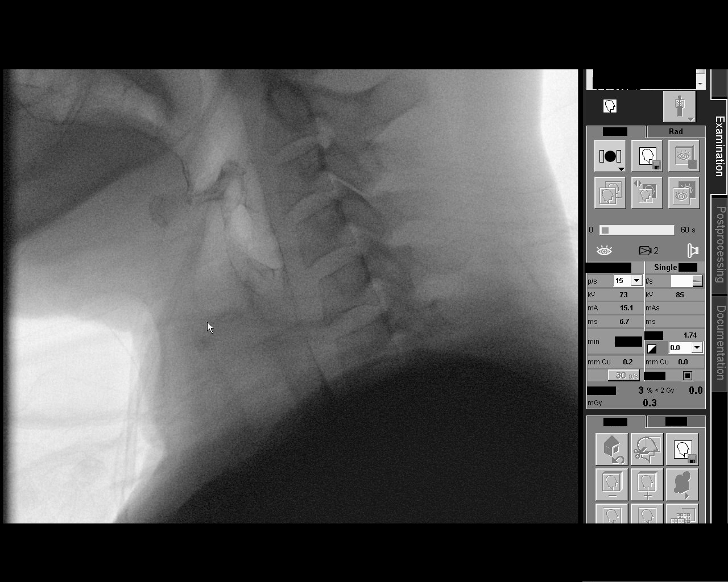

[Series 5: run · 1 of 506 frames shown (5 of 13)]
[frame 431/506]
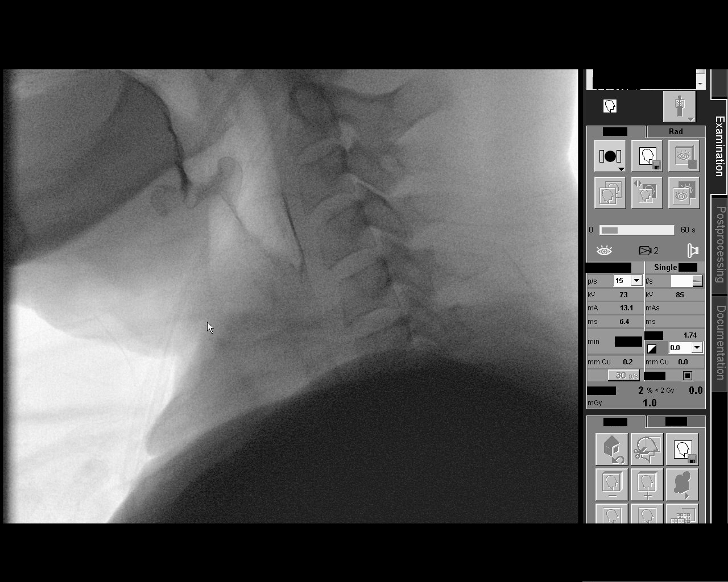

[Series 6: run · 1 of 189 frames shown (6 of 13)]
[frame 188/189]
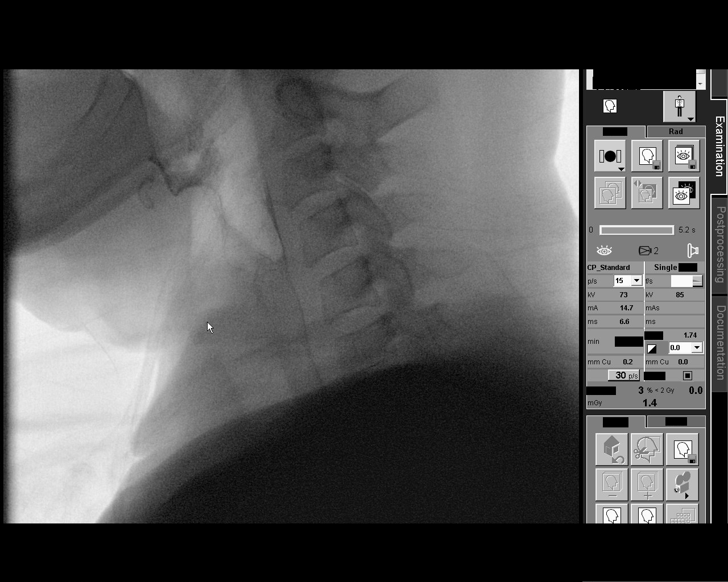

[Series 7: run · 1 of 253 frames shown (7 of 13)]
[frame 216/253]
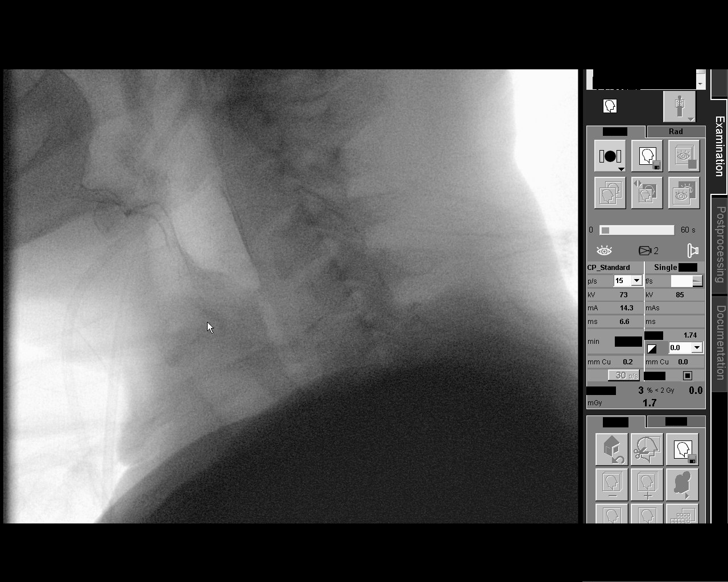

[Series 8: run · 1 of 191 frames shown (8 of 13)]
[frame 49/191]
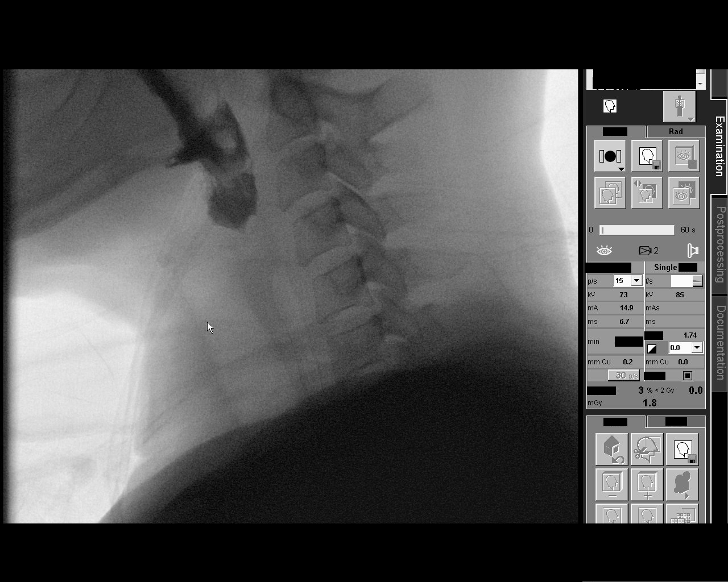

[Series 9: run · 1 of 582 frames shown (9 of 13)]
[frame 495/582]
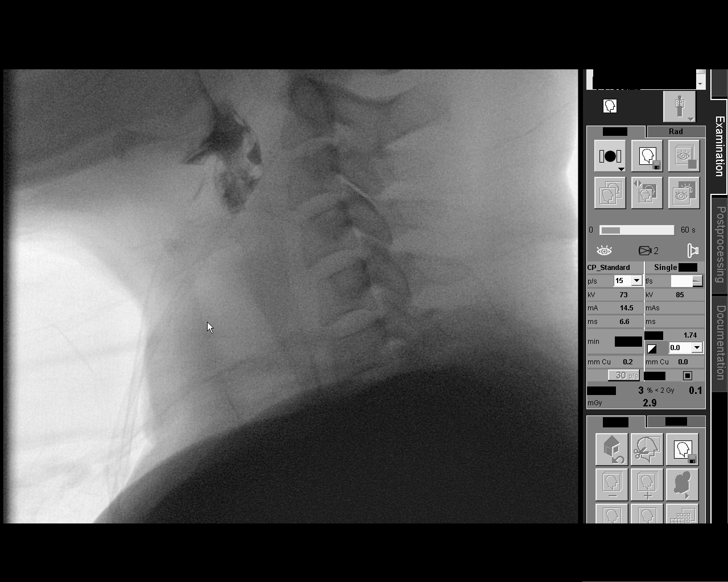

[Series 11: run · 1 of 69 frames shown (10 of 13)]
[frame 35/69]
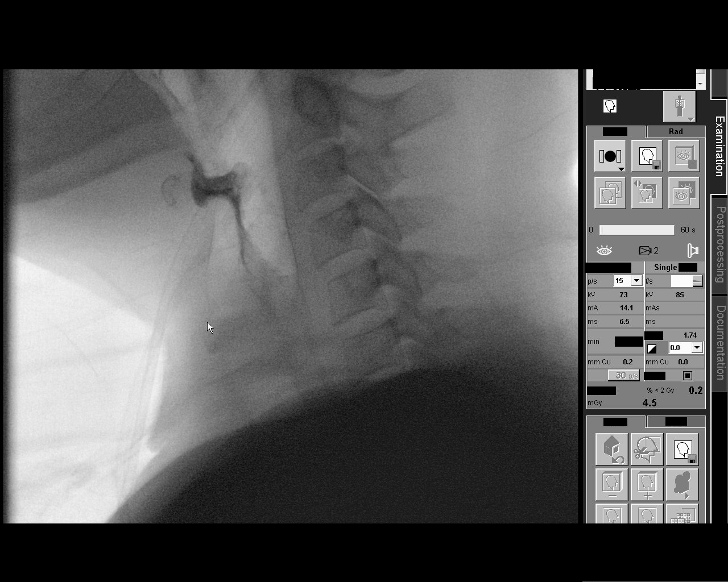

[Series 12: run · 1 of 662 frames shown (11 of 13)]
[frame 483/662]
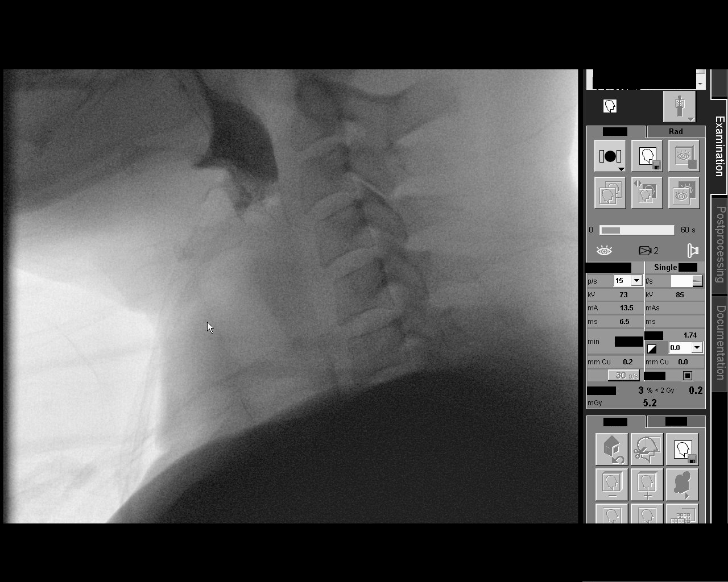

[Series 13: run · 1 of 132 frames shown (12 of 13)]
[frame 113/132]
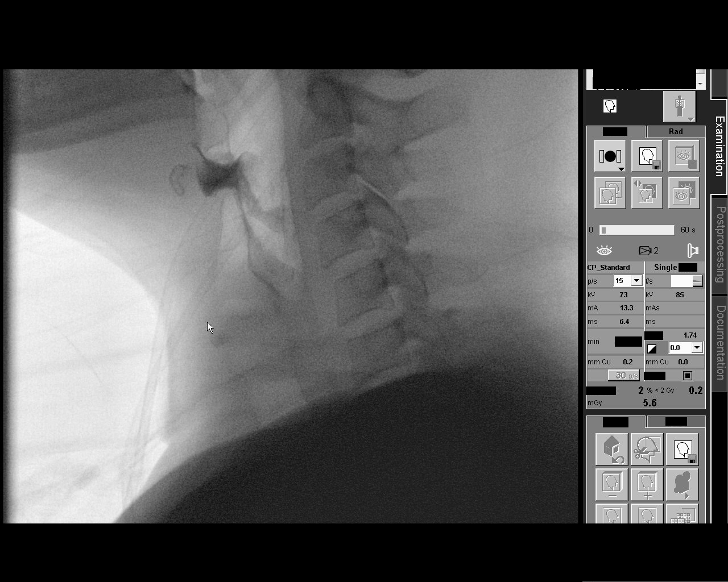

[Series 14: run · 1 of 289 frames shown (13 of 13)]
[frame 287/289]
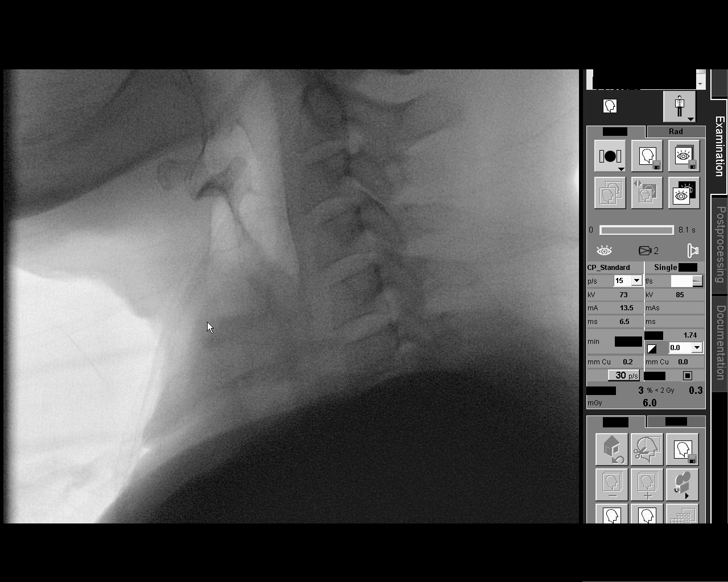

[13 of 24 positions shown; findings below may reference images not displayed]

FLUOROSCOPY FOR SWALLOWING FUNCTION STUDY:
Fluoroscopy was provided for swallowing function study, which was administered by a speech pathologist.  Final results and recommendations from this study are contained within the speech pathology report.

## 2016-03-31 IMAGING — US US SOFT TISSUE HEAD/NECK
1 series · 13 of 25 positions shown · non-contrast
Comparison: 08/20/2012

CLINICAL DATA: Hyperparathyroidism.  Thyromegaly on physical exam

EXAM:
THYROID ULTRASOUND
TECHNIQUE: Ultrasound examination of the thyroid gland and adjacent soft
tissues was performed.

[Series 1: us soft tissue head/neck · 0.07mm/px · 13 of 49 slices shown]
[im 1/49]
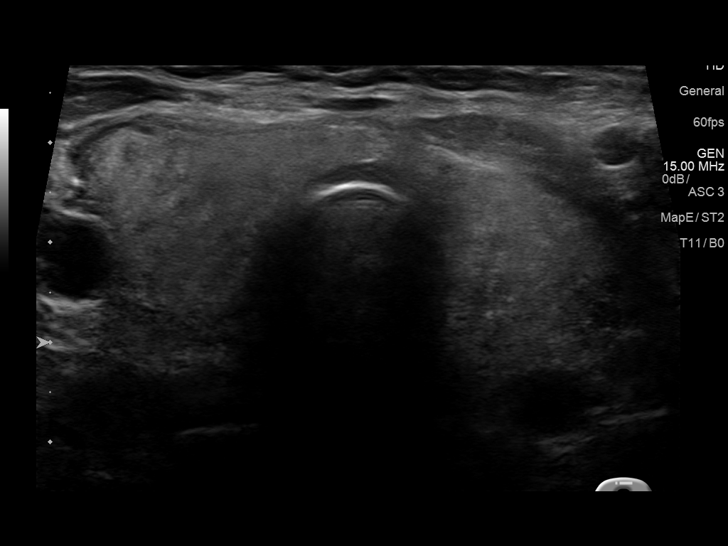
[im 5/49]
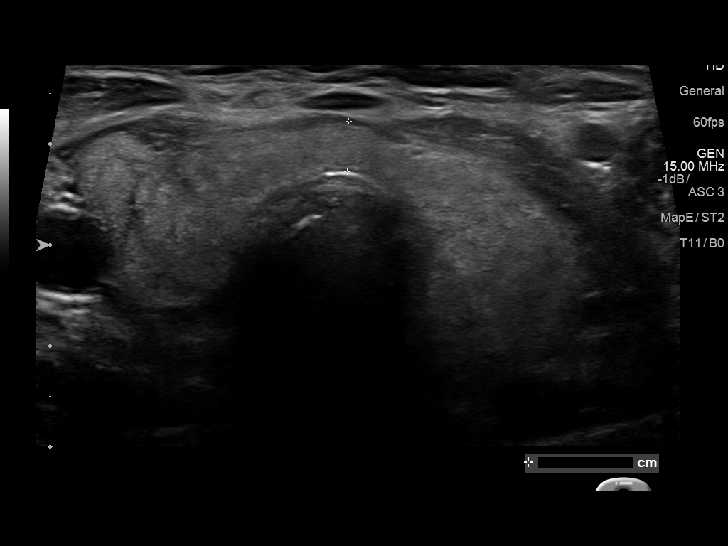
[im 9/49]
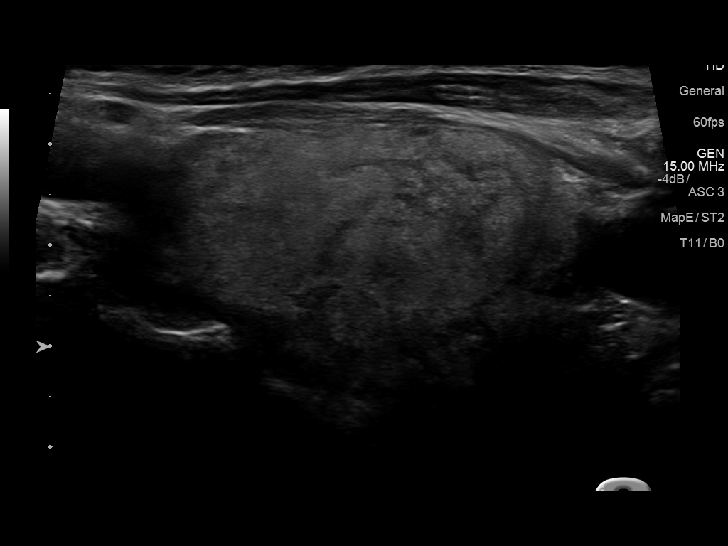
[im 13/49]
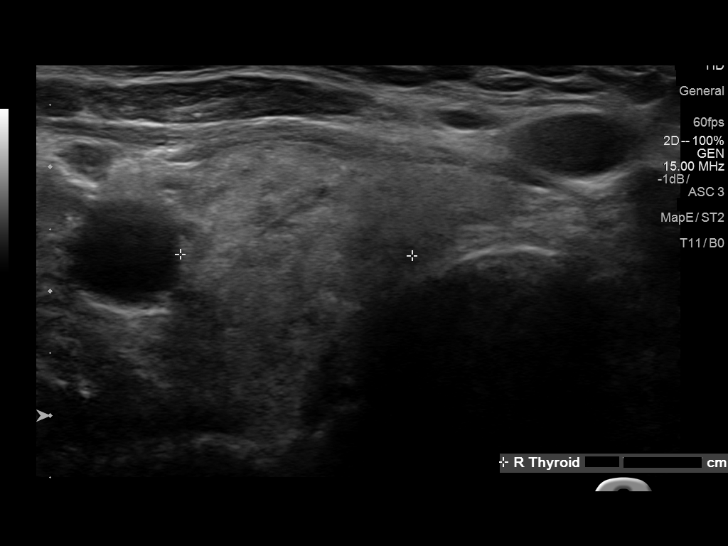
[im 17/49]
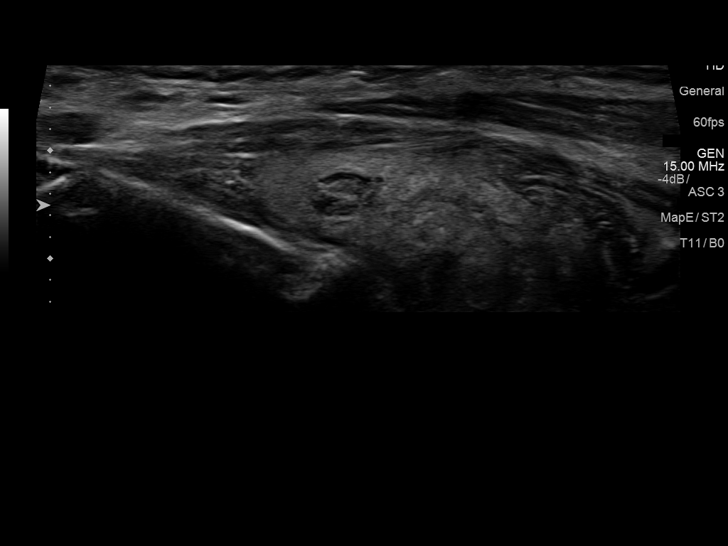
[im 21/49]
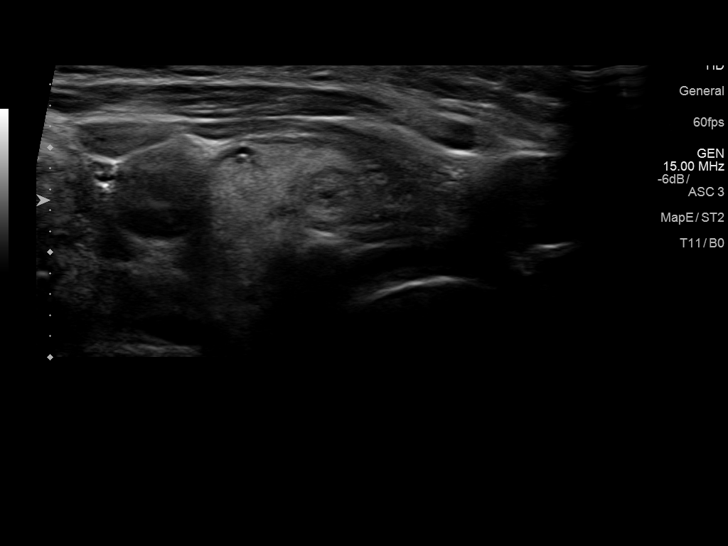
[im 25/49]
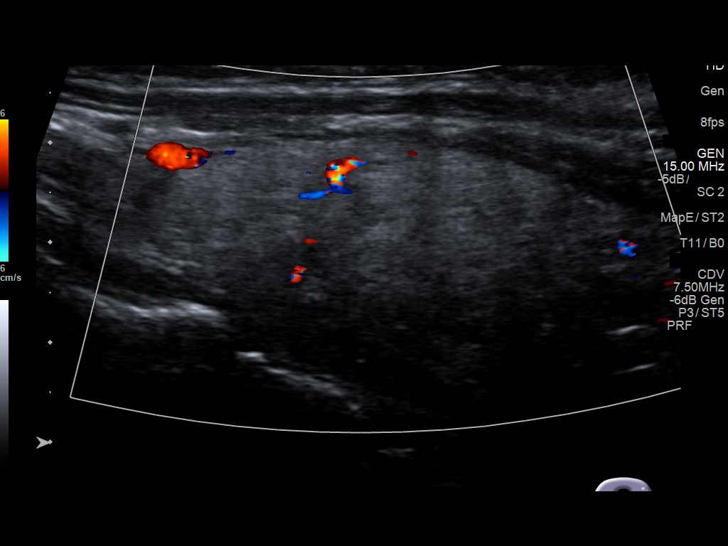
[im 29/49]
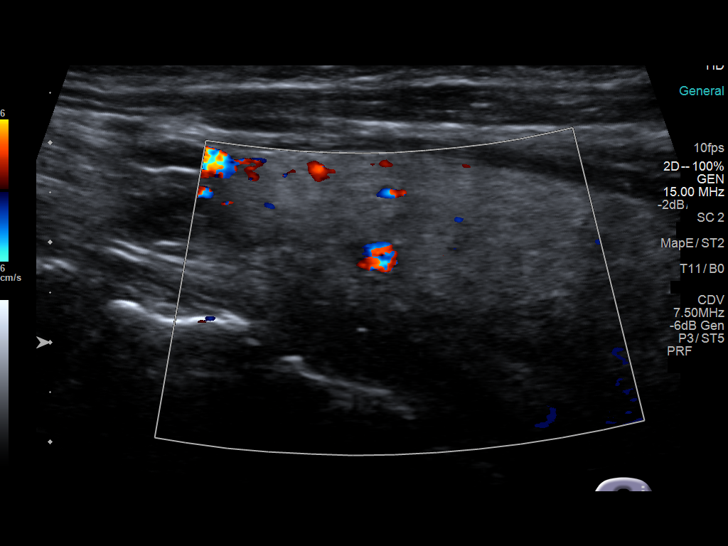
[im 33/49]
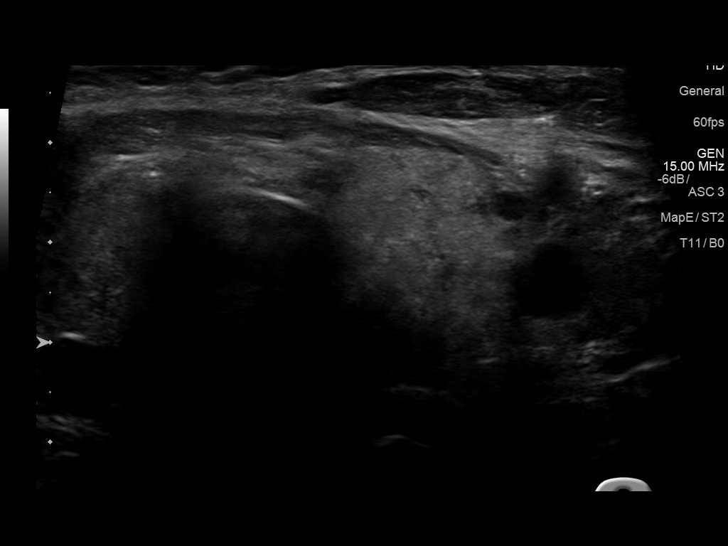
[im 37/49]
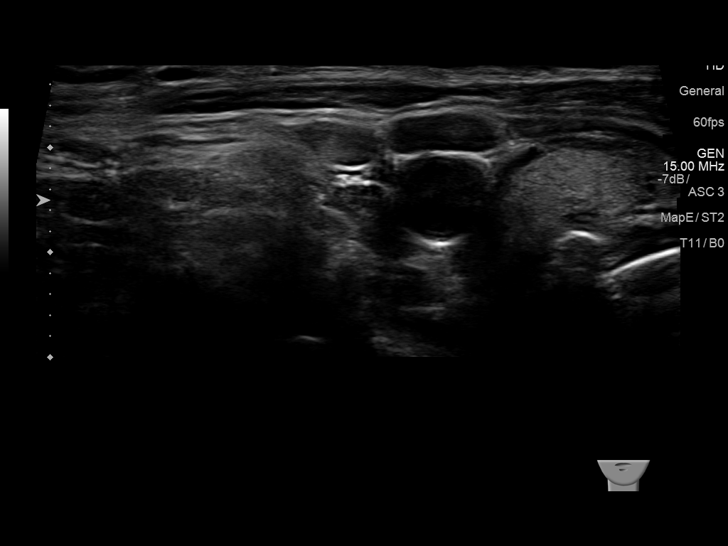
[im 41/49]
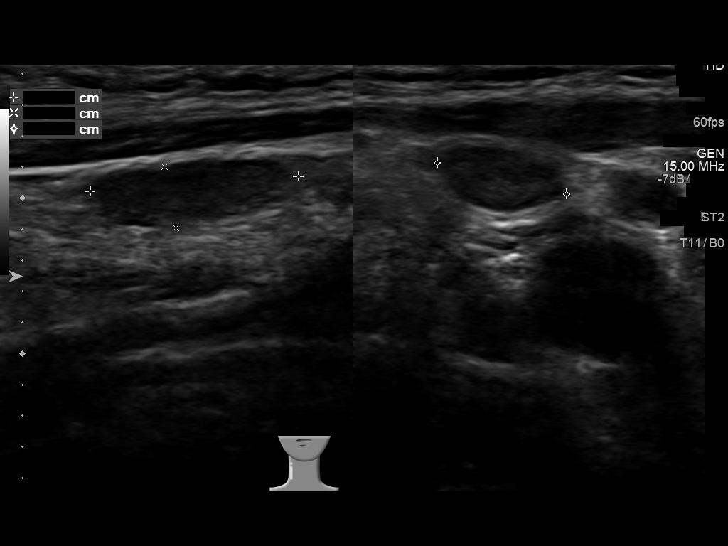
[im 45/49]
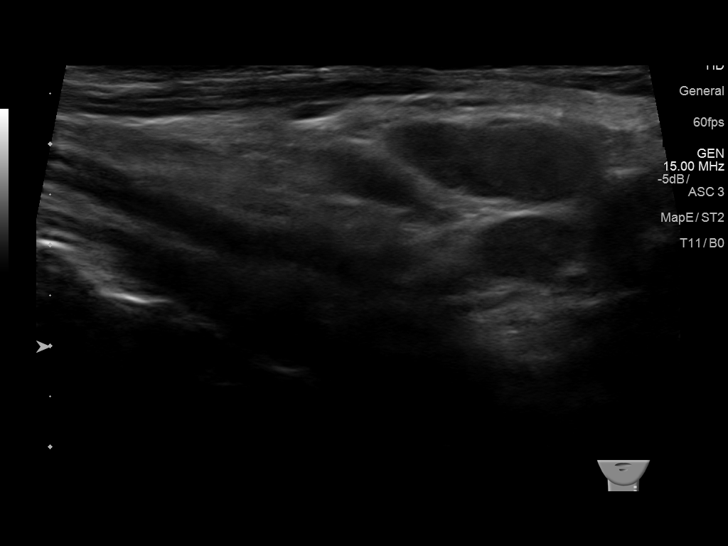
[im 49/49]
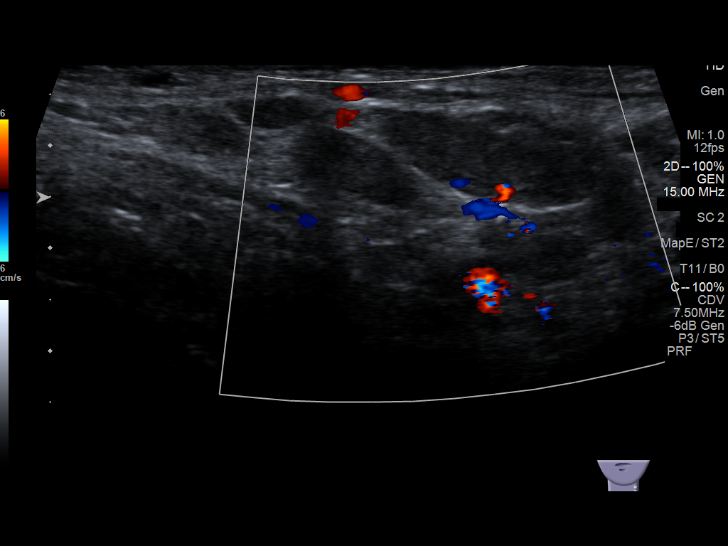

[13 of 25 positions shown; findings below may reference images not displayed]

FINDINGS: Right thyroid lobe

Measurements: 45 x 25 x 19 mm. Heterogeneous echotexture without
hyperemia. Single 7 x 4 x 5 mm complex mostly solid nodule, superior
pole.

Left thyroid lobe

Measurements: 53 x 18 x 21 mm.  No nodules visualized.

Isthmus

Thickness: 5 mm.  No nodules visualized.

Lymphadenopathy

Enlarged cervical chain nodes measuring up to 1 cm short axis
diameter.
IMPRESSION: 1. Mild cardiomegaly with a single small right nodule. Findings do
not meet current consensus criteria for biopsy. Follow-up by
clinical exam is recommended. If patient has known risk factors for
thyroid carcinoma, consider follow-up ultrasound in 12 months. If
patient is clinically hyperthyroid, consider nuclear medicine
thyroid uptake and scan. This recommendation follows the consensus
statement: Management of Thyroid Nodules Detected as US: Society of
Radiologists in Ultrasound Consensus Conference Statement. Radiology
6664; [DATE].
2. Borderline enlarged left cervical chain lymph nodes, possibly
reactive but nonspecific.

## 2016-03-31 IMAGING — CR DG CHEST 2V
2 series · 2 of 2 positions shown · non-contrast
Comparison: CT of 11/21/2015.  Plain film of 11/18/2015.

CLINICAL DATA: End-stage renal disease. Follow-up of pleural
effusion.

EXAM:
CHEST  2 VIEW

[chest lat]
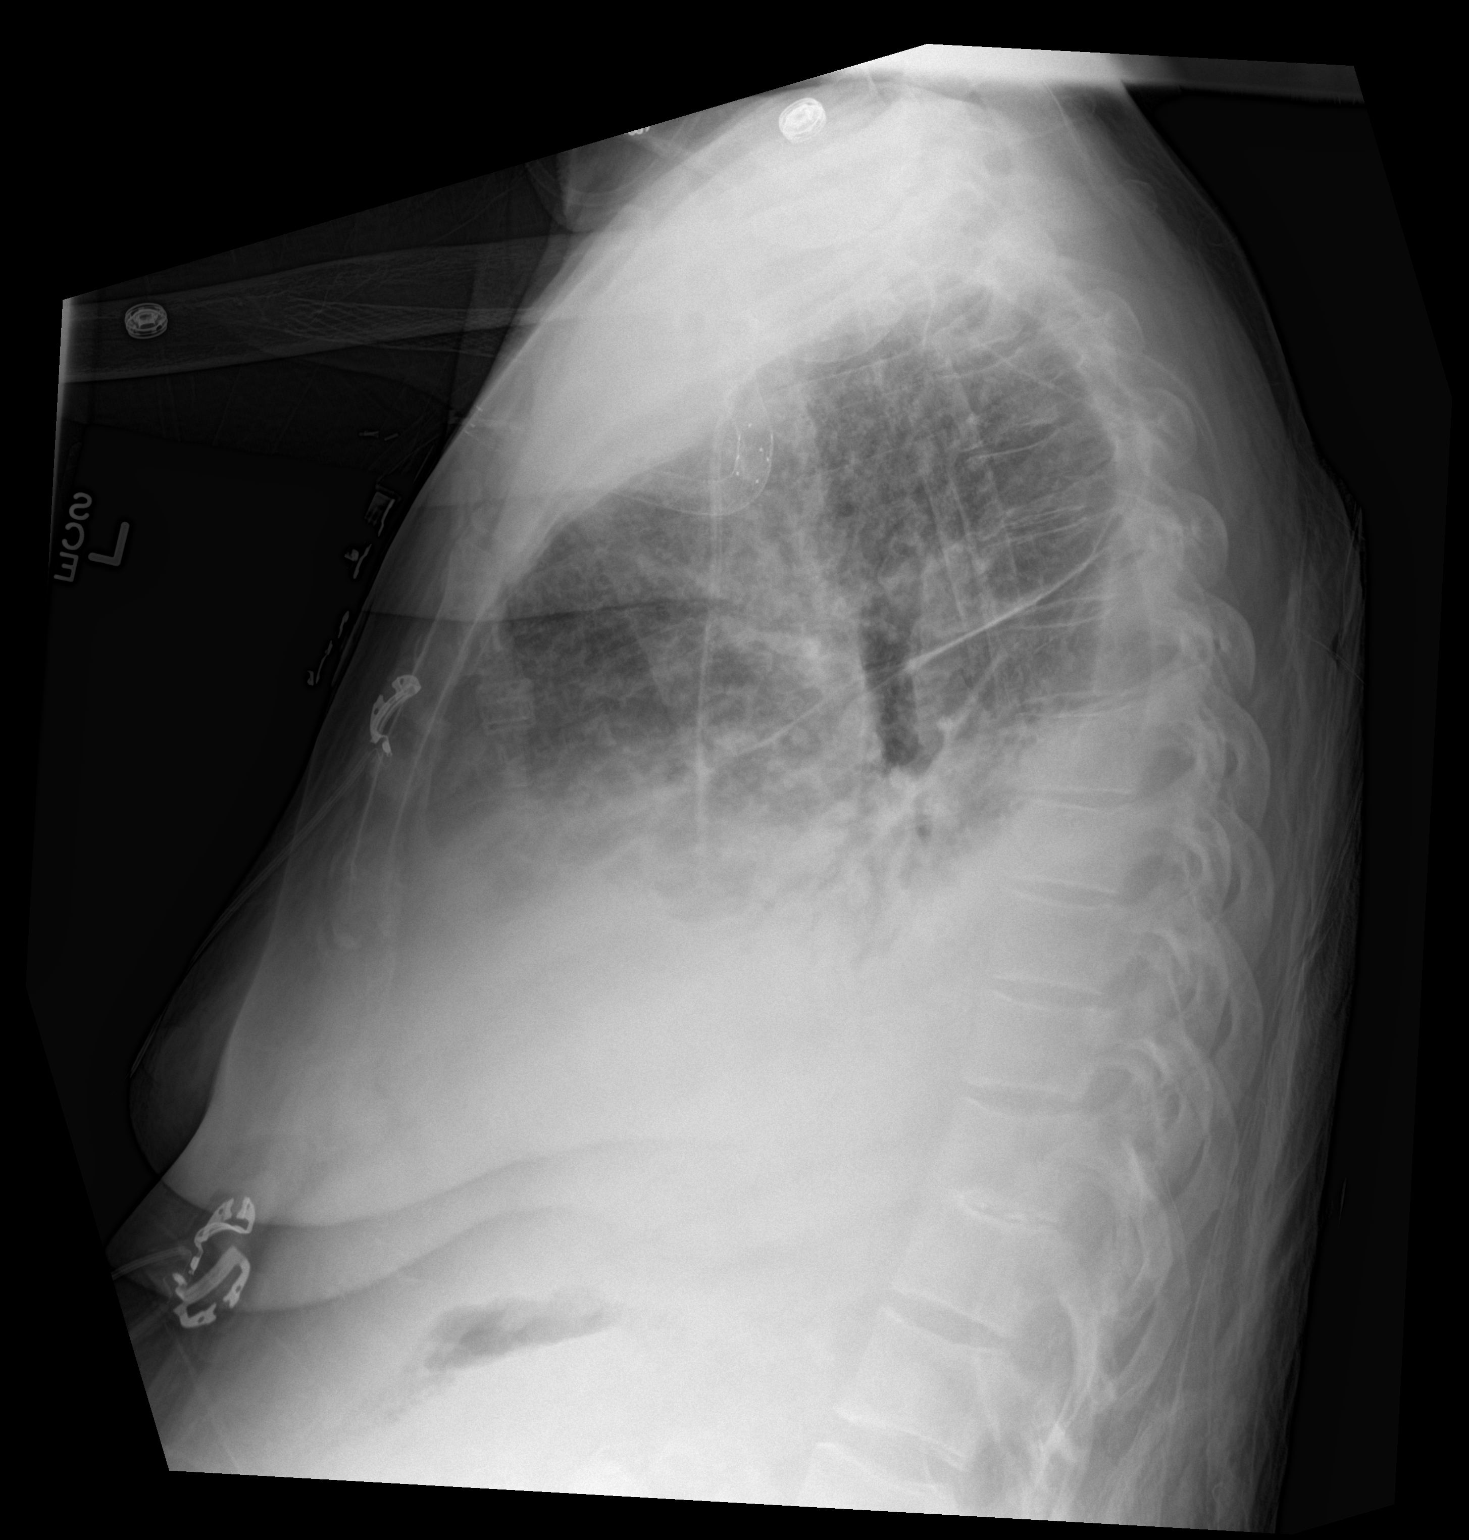

[chest ap]
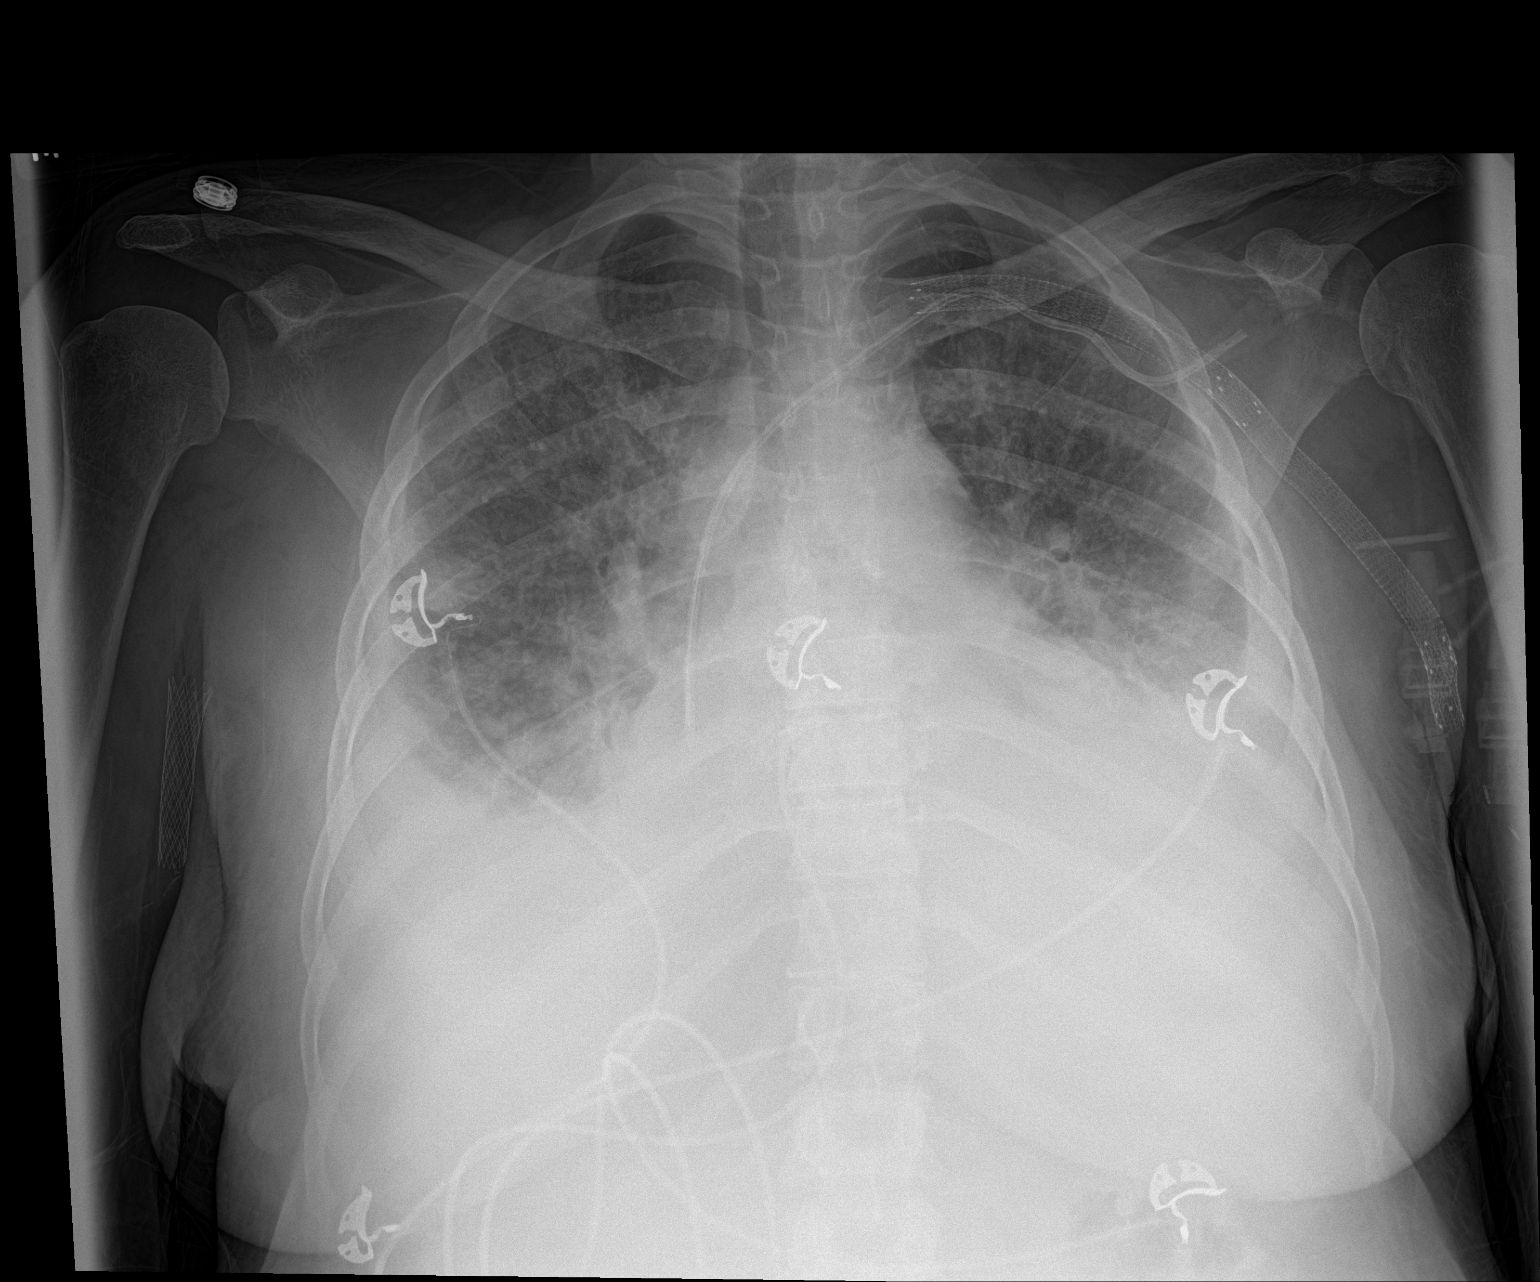

[2 of 2 positions shown; findings below may reference images not displayed]

FINDINGS: Extubation since the prior plain film. A left-sided subclavian line
terminates at the cavoatrial junction. Left sided axillary stent.
Midline trachea. Cardiomegaly accentuated by AP portable technique.
left-sided pleural effusion is similar, small to moderate.
Right-sided pleural effusion is slightly increased, moderate. No
pneumothorax. Interstitial edema is moderate and increased.
Accentuated by AP portable technique. Bibasilar airspace disease
persists.
IMPRESSION: Slight increase in moderate congestive heart failure since
11/18/2015.

Increased right and similar left -sided pleural effusions.

Bibasilar airspace disease which could represent atelectasis or
infection.

## 2016-04-04 ENCOUNTER — Emergency Department: Payer: Medicare Other

## 2016-04-04 ENCOUNTER — Emergency Department
Admission: EM | Admit: 2016-04-04 | Discharge: 2016-04-04 | Disposition: A | Discharge status: Home / Self Care | Payer: Medicare Other | Attending: Emergency Medicine | Admitting: Emergency Medicine

## 2016-04-04 DIAGNOSIS — N186 End stage renal disease: Secondary | ICD-10-CM | POA: Diagnosis not present

## 2016-04-04 DIAGNOSIS — M1990 Unspecified osteoarthritis, unspecified site: Secondary | ICD-10-CM | POA: Insufficient documentation

## 2016-04-04 DIAGNOSIS — R1011 Right upper quadrant pain: Secondary | ICD-10-CM | POA: Insufficient documentation

## 2016-04-04 DIAGNOSIS — I252 Old myocardial infarction: Secondary | ICD-10-CM | POA: Insufficient documentation

## 2016-04-04 DIAGNOSIS — J45909 Unspecified asthma, uncomplicated: Secondary | ICD-10-CM | POA: Diagnosis not present

## 2016-04-04 DIAGNOSIS — I5032 Chronic diastolic (congestive) heart failure: Secondary | ICD-10-CM | POA: Insufficient documentation

## 2016-04-04 DIAGNOSIS — E1122 Type 2 diabetes mellitus with diabetic chronic kidney disease: Secondary | ICD-10-CM | POA: Insufficient documentation

## 2016-04-04 DIAGNOSIS — F331 Major depressive disorder, recurrent, moderate: Secondary | ICD-10-CM | POA: Insufficient documentation

## 2016-04-04 DIAGNOSIS — R112 Nausea with vomiting, unspecified: Secondary | ICD-10-CM

## 2016-04-04 DIAGNOSIS — Z79899 Other long term (current) drug therapy: Secondary | ICD-10-CM | POA: Insufficient documentation

## 2016-04-04 DIAGNOSIS — R197 Diarrhea, unspecified: Secondary | ICD-10-CM | POA: Diagnosis not present

## 2016-04-04 DIAGNOSIS — Z992 Dependence on renal dialysis: Secondary | ICD-10-CM | POA: Diagnosis not present

## 2016-04-04 DIAGNOSIS — Z87891 Personal history of nicotine dependence: Secondary | ICD-10-CM | POA: Insufficient documentation

## 2016-04-04 DIAGNOSIS — I2510 Atherosclerotic heart disease of native coronary artery without angina pectoris: Secondary | ICD-10-CM | POA: Insufficient documentation

## 2016-04-04 DIAGNOSIS — Z8719 Personal history of other diseases of the digestive system: Secondary | ICD-10-CM | POA: Insufficient documentation

## 2016-04-04 DIAGNOSIS — I132 Hypertensive heart and chronic kidney disease with heart failure and with stage 5 chronic kidney disease, or end stage renal disease: Secondary | ICD-10-CM | POA: Diagnosis not present

## 2016-04-04 DIAGNOSIS — Z7982 Long term (current) use of aspirin: Secondary | ICD-10-CM | POA: Diagnosis not present

## 2016-04-04 DIAGNOSIS — J449 Chronic obstructive pulmonary disease, unspecified: Secondary | ICD-10-CM | POA: Insufficient documentation

## 2016-04-04 LAB — COMPREHENSIVE METABOLIC PANEL
ALT: 15 U/L (ref 14–54)
AST: 24 U/L (ref 15–41)
Albumin: 3.3 g/dL — ABNORMAL LOW (ref 3.5–5.0)
Alkaline Phosphatase: 96 U/L (ref 38–126)
Anion gap: 9 (ref 5–15)
BUN: 17 mg/dL (ref 6–20)
CO2: 31 mmol/L (ref 22–32)
Calcium: 8.8 mg/dL — ABNORMAL LOW (ref 8.9–10.3)
Chloride: 91 mmol/L — ABNORMAL LOW (ref 101–111)
Creatinine, Ser: 4.16 mg/dL — ABNORMAL HIGH (ref 0.44–1.00)
GFR calc Af Amer: 13 mL/min — ABNORMAL LOW (ref >60)
GFR calc non Af Amer: 11 mL/min — ABNORMAL LOW (ref >60)
Glucose, Bld: 66 mg/dL (ref 65–99)
Potassium: 3.9 mmol/L (ref 3.5–5.1)
Sodium: 131 mmol/L — ABNORMAL LOW (ref 135–145)
Total Bilirubin: 0.8 mg/dL (ref 0.3–1.2)
Total Protein: 8 g/dL (ref 6.5–8.1)

## 2016-04-04 LAB — CBC
HCT: 29.3 % — ABNORMAL LOW (ref 35.0–47.0)
Hemoglobin: 9.6 g/dL — ABNORMAL LOW (ref 12.0–16.0)
MCH: 24.8 pg — ABNORMAL LOW (ref 26.0–34.0)
MCHC: 32.8 g/dL (ref 32.0–36.0)
MCV: 75.6 fL — ABNORMAL LOW (ref 80.0–100.0)
Platelets: 208 10*3/uL (ref 150–440)
RBC: 3.87 MIL/uL (ref 3.80–5.20)
RDW: 22.6 % — ABNORMAL HIGH (ref 11.5–14.5)
WBC: 5 10*3/uL (ref 3.6–11.0)

## 2016-04-04 LAB — LIPASE, BLOOD: Lipase: 12 U/L (ref 11–51)

## 2016-04-04 IMAGING — CR DG CHEST 2V
1 series · 2 of 2 positions shown · non-contrast
Comparison: Pre thoracentesis chest x-ray 11/24/2006

CLINICAL DATA: 54-year-old female status post left thoracentesis

EXAM:
CHEST  2 VIEW

[Series 1: x chest ap · 0.14mm/px · 2 of 2 slices shown]
[im 1/2]
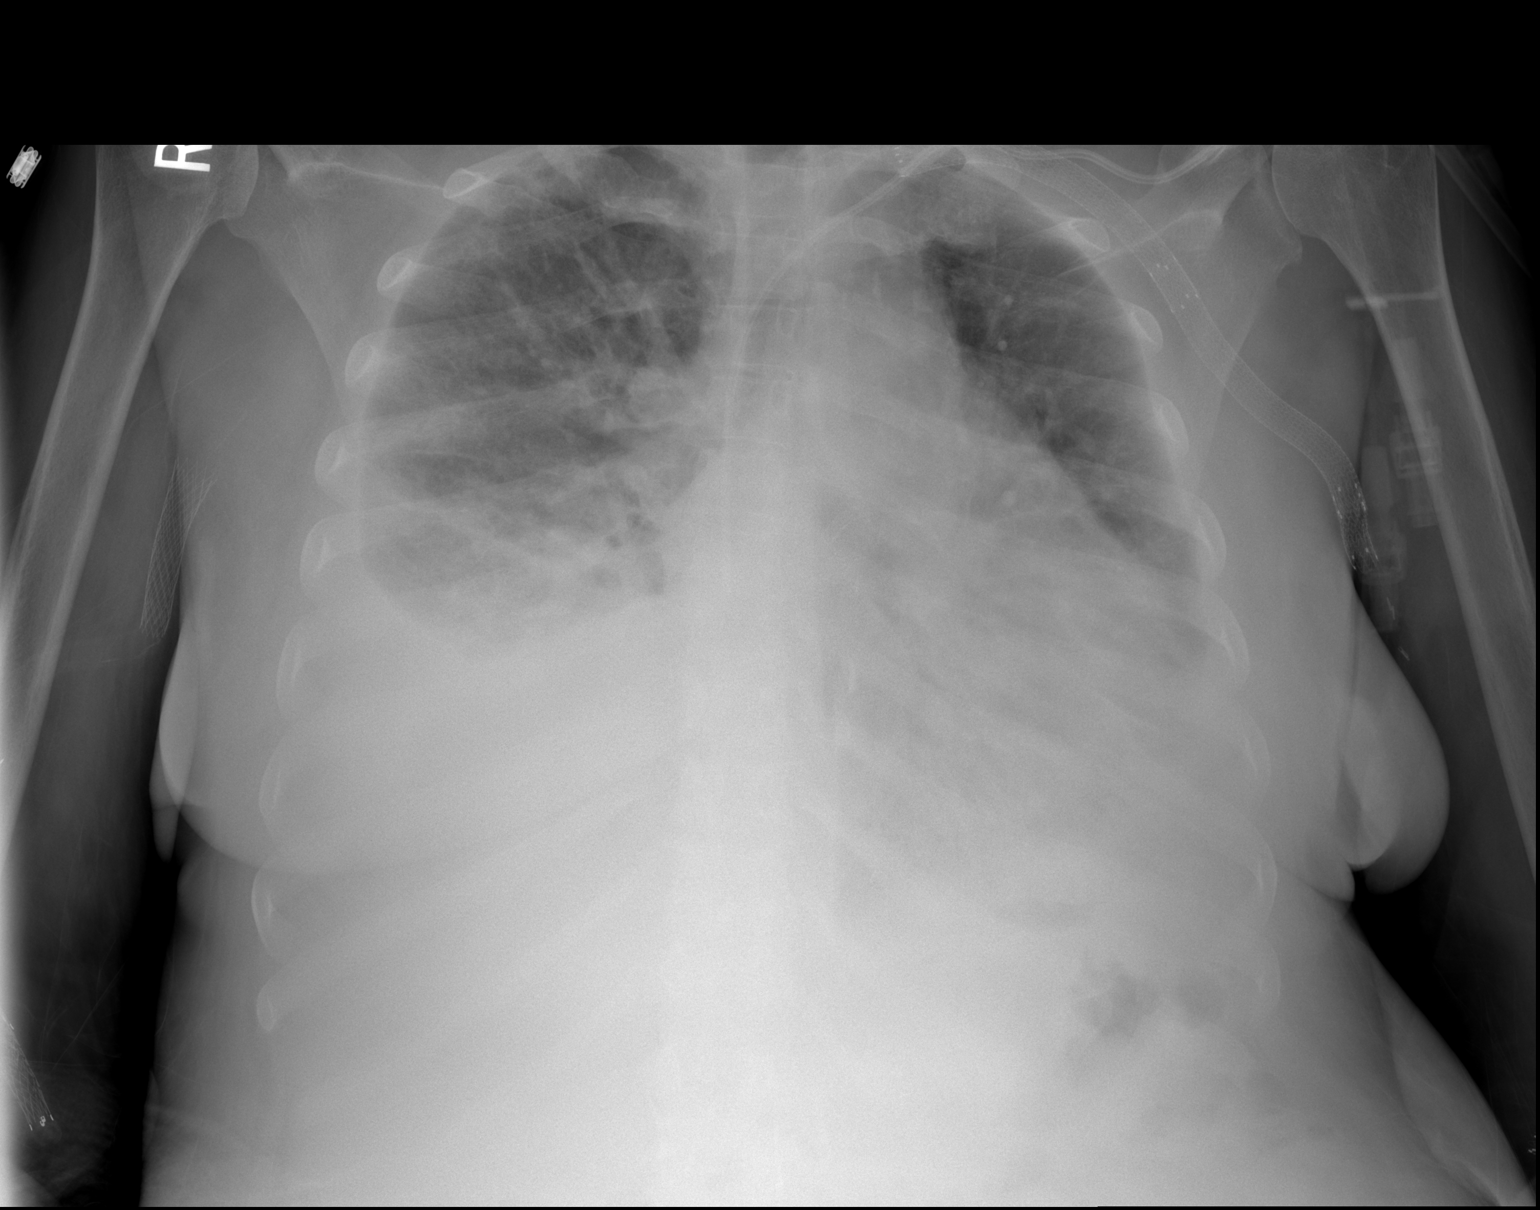
[im 2/2]
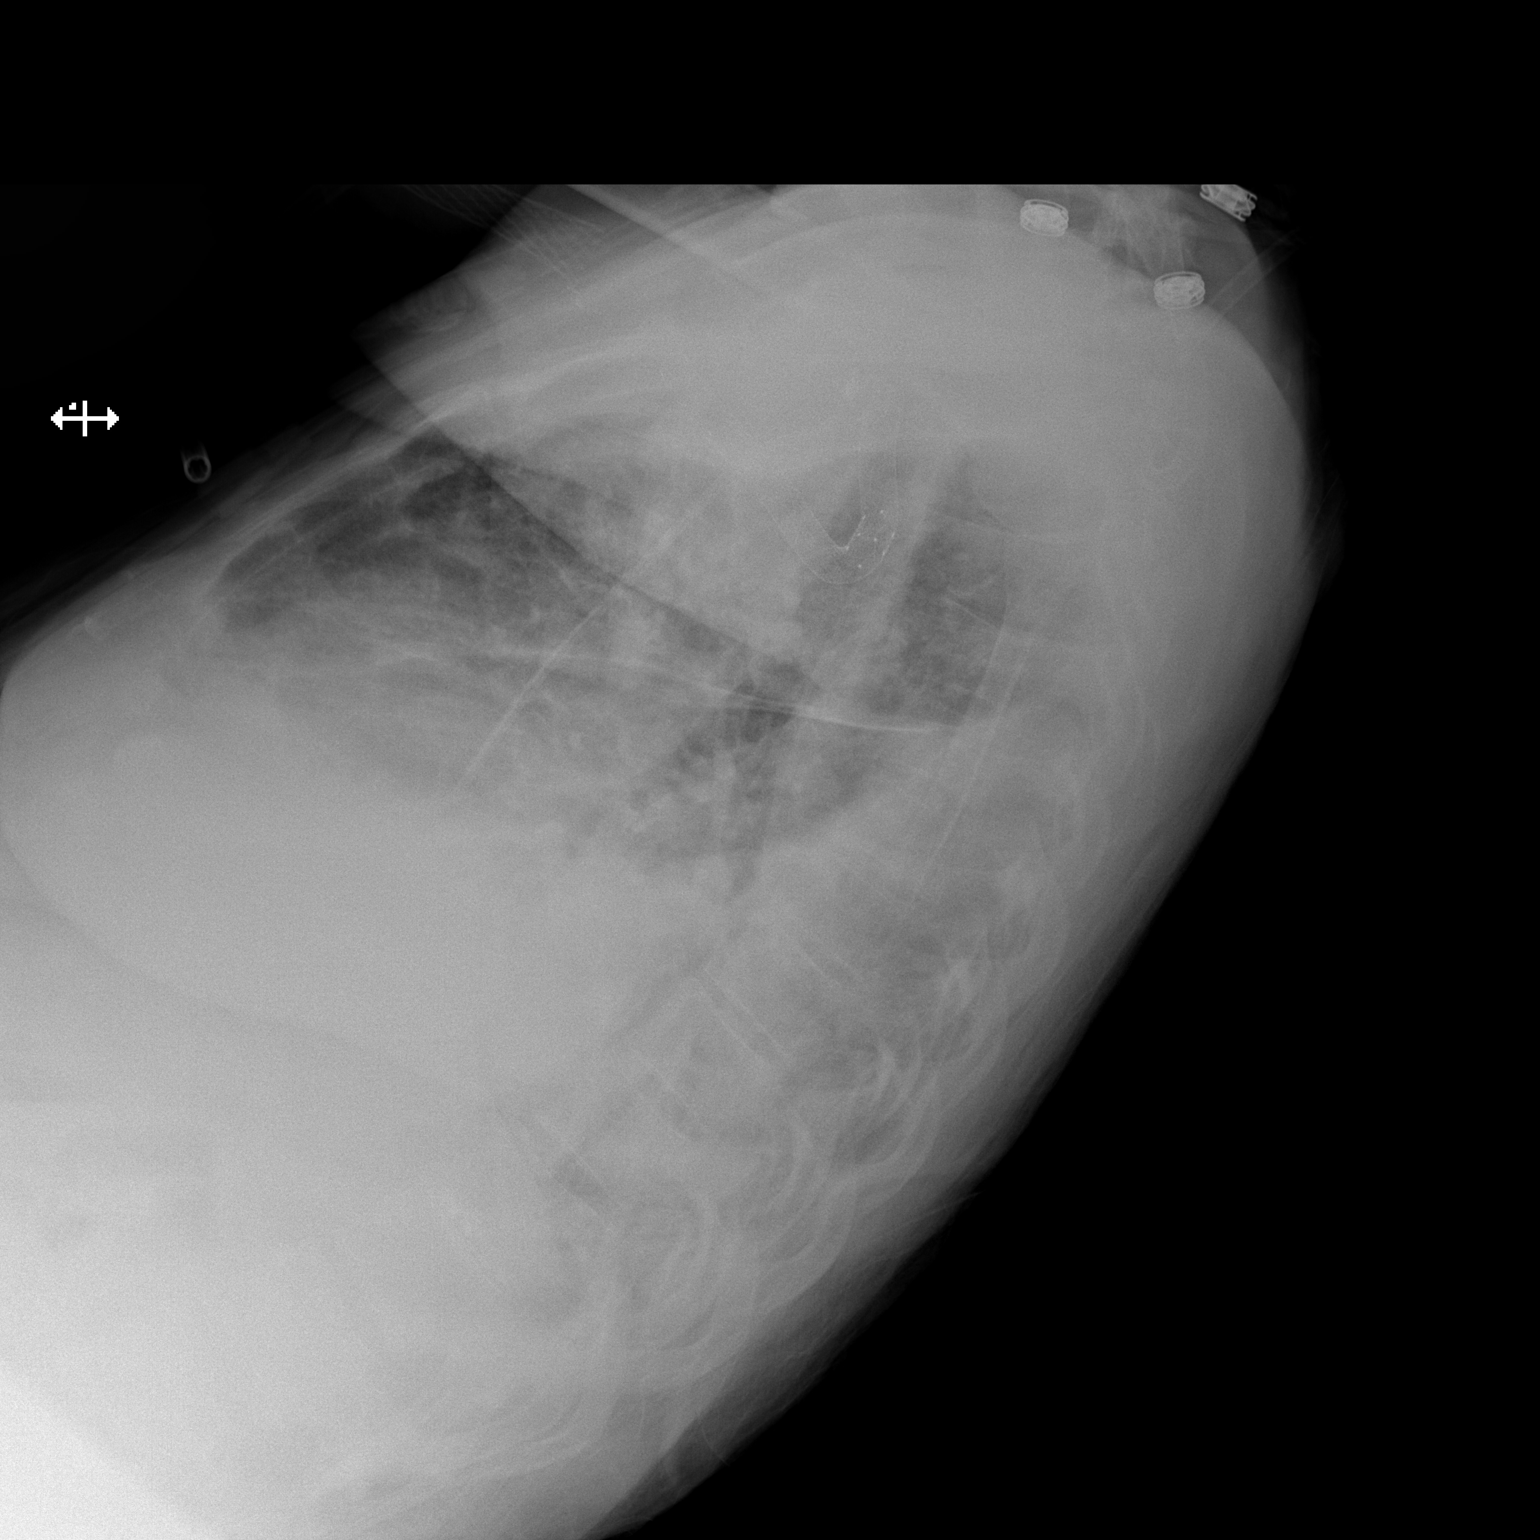

[2 of 2 positions shown; findings below may reference images not displayed]

FINDINGS: No evidence of left-sided pneumothorax. Significant interval
decrease in the left layering pleural effusion. There is some
residual atelectasis in the left base. Persistent moderate right
pleural effusion with associated atelectasis. Cardiomegaly and
pulmonary vascular congestion. Left subclavian approach central
venous catheter with the tip overlying the superior cavoatrial
junction. Overlapping stents are present in the left subclavian and
axillary veins.
IMPRESSION: 1. No evidence of pneumothorax status post left-sided thoracentesis.
2. Significant interval decrease in the size of left pleural
effusion. There is some persistent residual atelectasis of the left
lung base.
3. Unchanged moderate layering right pleural effusion with
associated right basilar atelectasis.
4. Cardiomegaly and pulmonary vascular congestion bordering on mild
edema.

## 2016-04-04 IMAGING — US US THORACENTESIS ASP PLEURAL SPACE W/IMG GUIDE
1 series · 8 of 8 positions shown · non-contrast
Comparison: none

INDICATION: 54-year-old female with end-stage renal disease on hemodialysis. She
has bilateral moderate pleural effusions. After sonographic
evaluation, the effusions are relatively symmetric in size although
the right contains a few thin internal septations. Therefore, a
left-sided thoracentesis will be performed.

[Series 1: us thoracentesis asp pleural space w/img guide · 0.24mm/px · 8 of 8 slices shown]
[im 1/8]
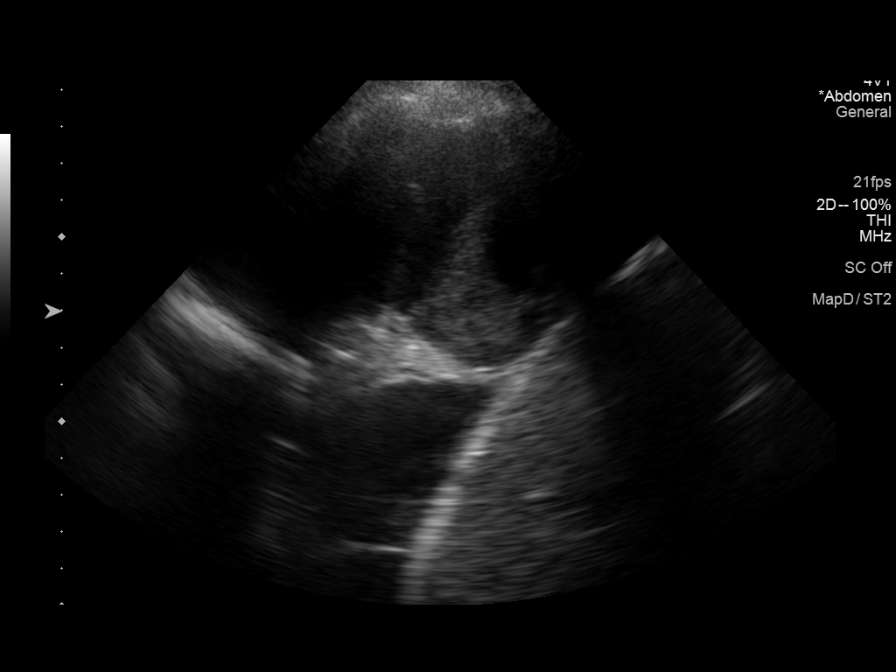
[im 2/8]
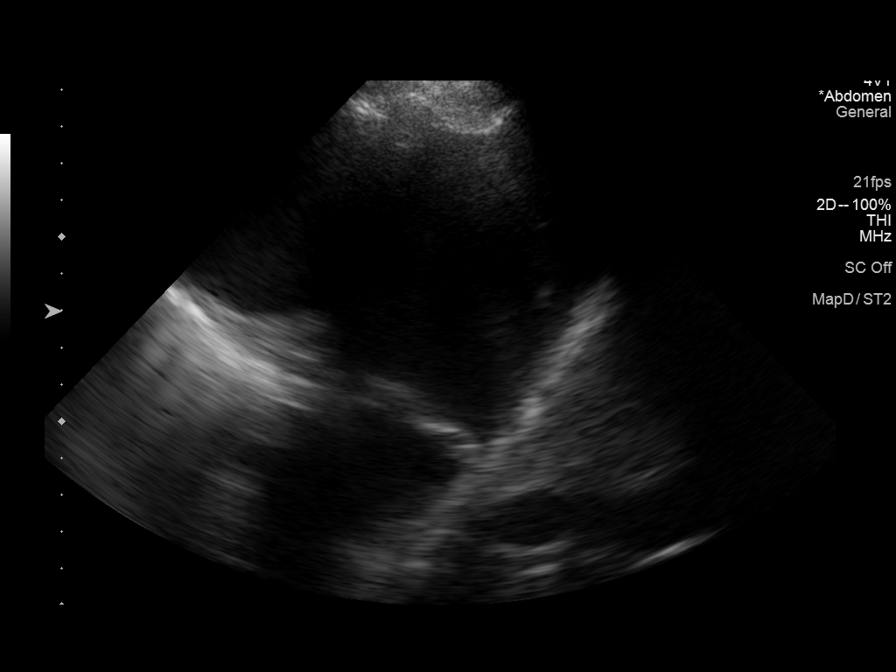
[im 3/8]
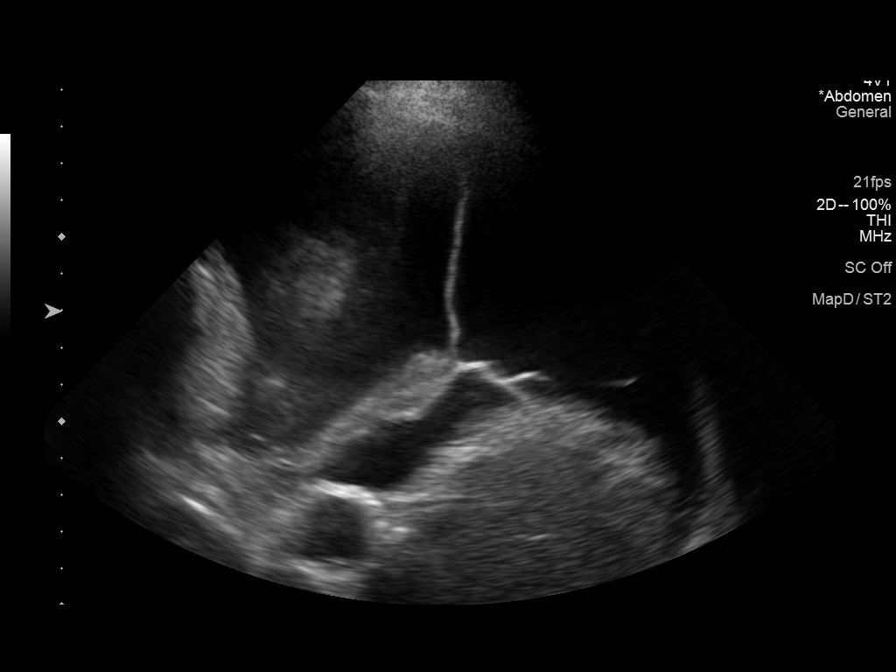
[im 4/8]
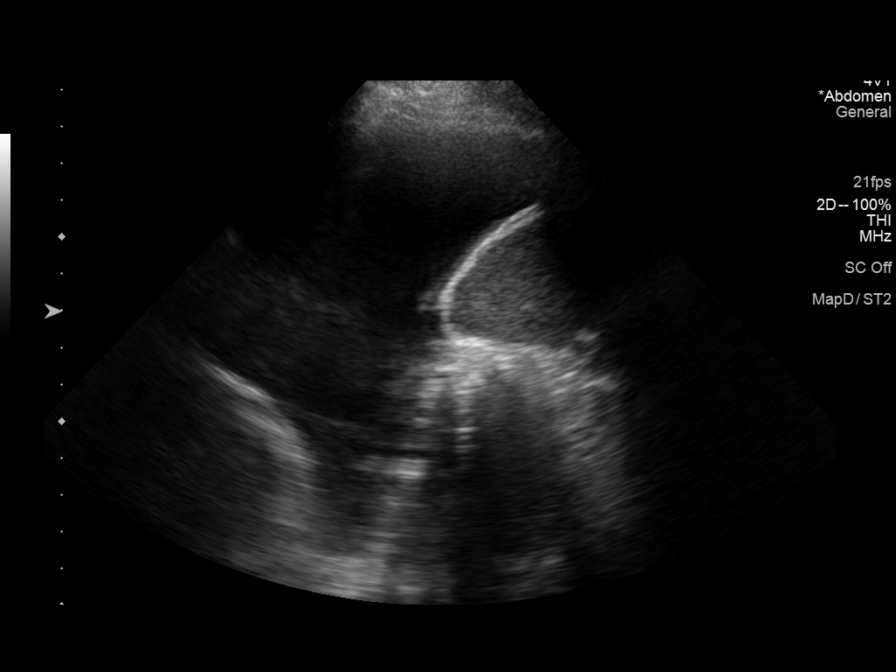
[im 5/8]
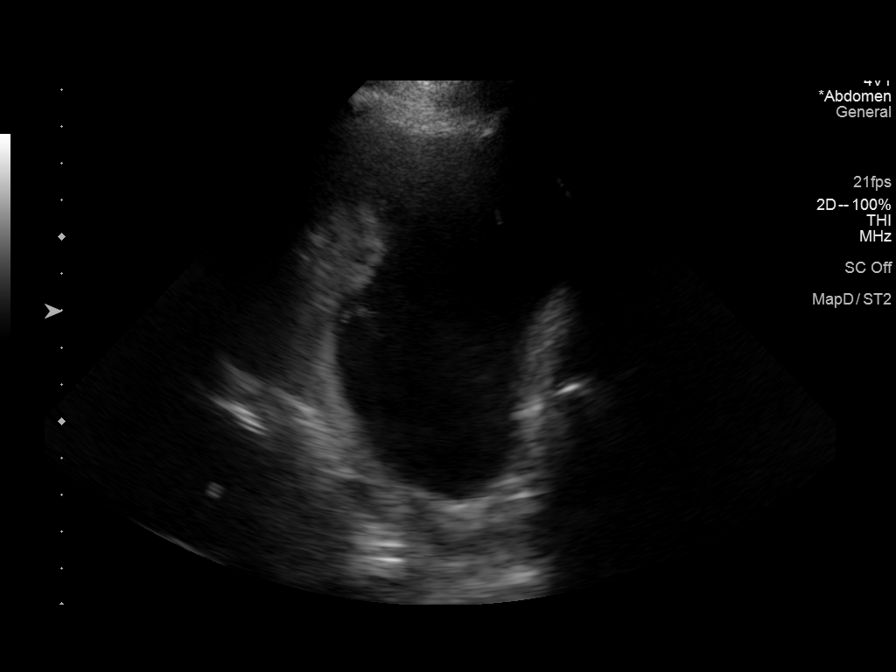
[im 6/8]
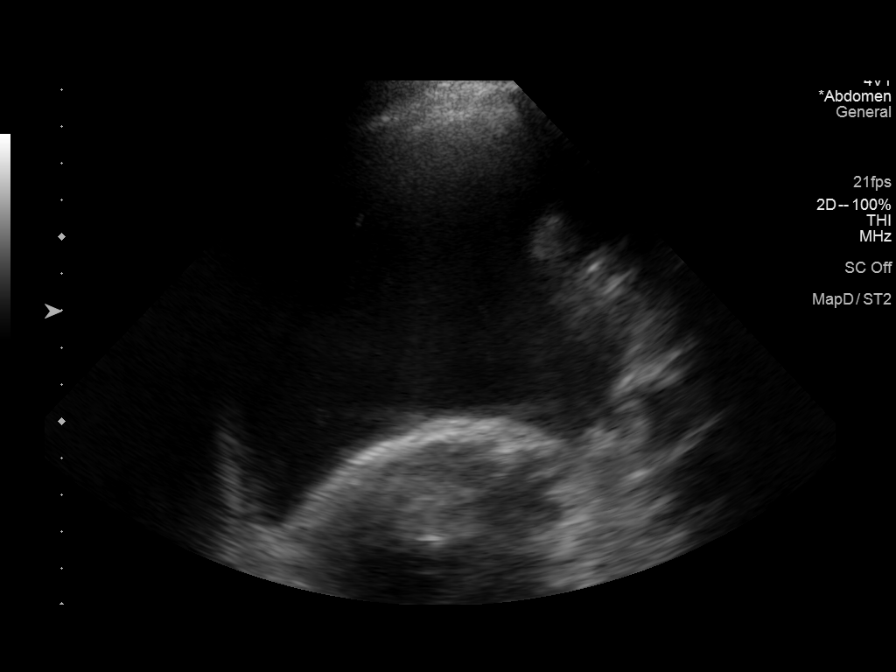
[im 7/8]
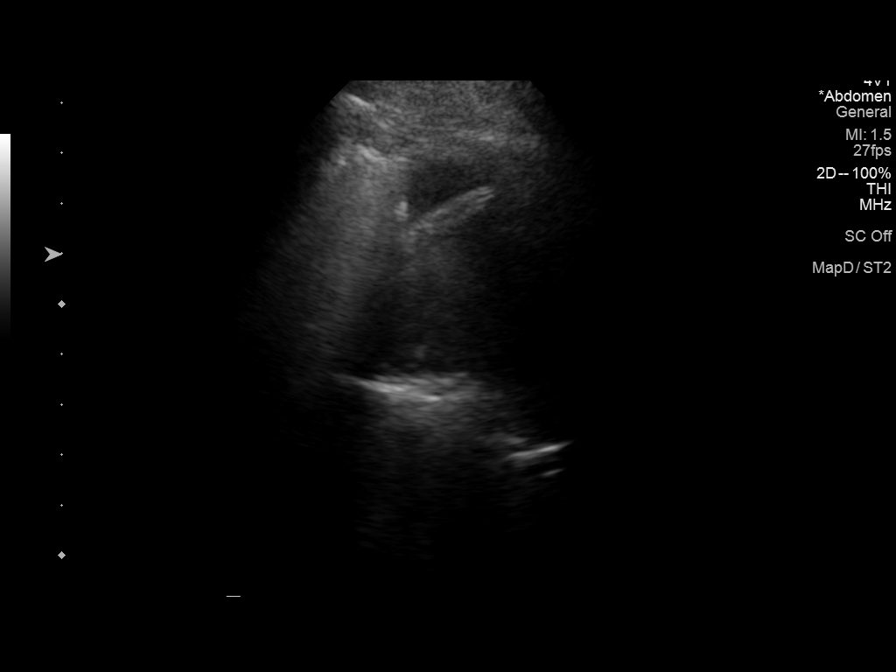
[im 8/8]
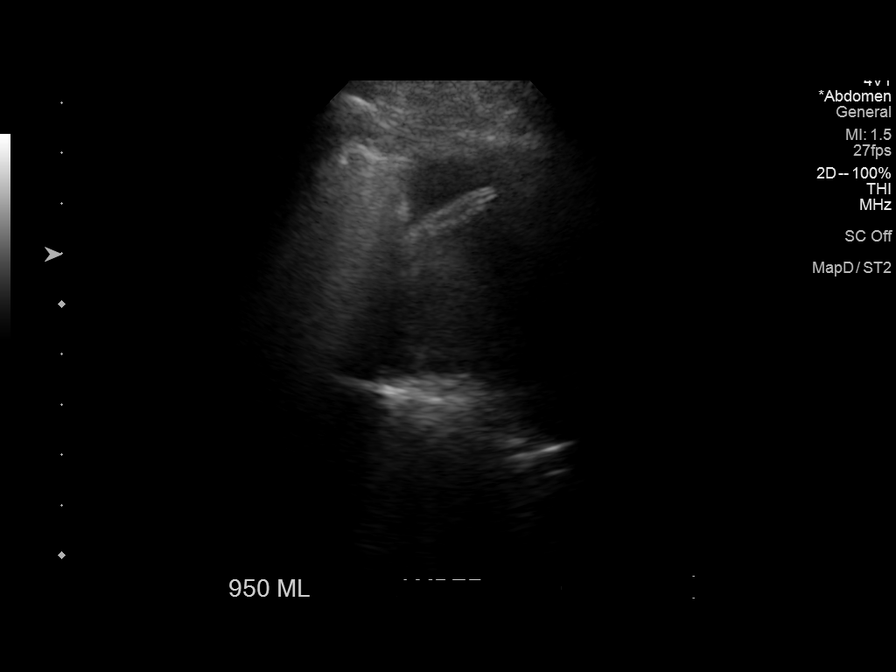

[8 of 8 positions shown; findings below may reference images not displayed]

EXAM:
ULTRASOUND GUIDED LEFT THORACENTESIS

MEDICATIONS:
None.

COMPLICATIONS:
None immediate.

PROCEDURE:
An ultrasound guided thoracentesis was thoroughly discussed with the
patient and questions answered. The benefits, risks, alternatives
and complications were also discussed. The patient understands and
wishes to proceed with the procedure. Written consent was obtained.

Ultrasound was performed to localize and mark an adequate pocket of
fluid in the left chest. The area was then prepped and draped in the
normal sterile fashion. 1% Lidocaine was used for local anesthesia.
Under ultrasound guidance a 6 Fr Safe-T-Centesis catheter was
introduced. Thoracentesis was performed. The catheter was removed
and a dressing applied.
FINDINGS: A total of approximately 950 mL of dark amber colored fluid was
removed. Samples were sent to the laboratory as requested by the
clinical team.
IMPRESSION: Successful ultrasound guided left thoracentesis yielding 950 mL of
pleural fluid.

## 2016-04-04 MED ORDER — METOCLOPRAMIDE HCL 10 MG PO TABS: 10.0000 mg | ORAL_TABLET | Freq: Four times a day (QID) | ORAL | Status: DC | PRN

## 2016-04-04 MED ORDER — FENTANYL CITRATE (PF) 100 MCG/2ML IJ SOLN
50.0000 ug | Freq: Once | INTRAMUSCULAR | Status: AC
  Administered 2016-04-04: 50 ug via INTRAVENOUS
  Filled 2016-04-04: qty 2

## 2016-04-04 MED ORDER — METOCLOPRAMIDE HCL 5 MG/ML IJ SOLN
INTRAMUSCULAR | Status: AC
  Filled 2016-04-04: qty 2

## 2016-04-04 MED ORDER — METOCLOPRAMIDE HCL 5 MG/ML IJ SOLN
10.0000 mg | Freq: Once | INTRAMUSCULAR | Status: AC
  Administered 2016-04-04: 10 mg via INTRAVENOUS
  Filled 2016-04-04: qty 2

## 2016-04-04 NOTE — ED Notes (Signed)
Pt taken off of oxygen. RA saturation 97-98%.

## 2016-04-04 NOTE — ED Provider Notes (Signed)
[facility] Emergency Department Provider Note  Time seen: 7:32 AM  I have reviewed the triage vital signs and the nursing notes.   HISTORY  Chief Complaint Nausea; Emesis; Diarrhea; and Abdominal Pain    HPI [name] is a 55 y.o. female with a past medical history of asthma, diabetes, hypertension, diabetic neuropathy, MI, COPD, end-stage renal disease on hemodialysis Monday, Wednesday, Friday who presents to the emergency department with nausea and vomiting. According to the patient she has chronic nausea at home for which she takes Phenergan. She states that Phenergan has not been working. She states she went to bed early last night because she was feeling nauseated and awoke around 4:00 this morning vomiting and with right upper quadrant abdominal pain. She also states one episode of diarrhea. Patient attempted to go to dialysis this morning but when her ride arrived she stated she did not feel well enough so she called EMS instead to come to the emergency department for evaluation.Patient currently describing 8/10 aching right upper quadrant abdominal pain along with nausea. Denies fever.     Past Medical History  Diagnosis Date  . Asthma   . Collagen vascular disease ([place])   . Diabetes mellitus without complication ([place])   . Hypertension   . Coronary artery disease     a. cath 2013: stenting to RCA (report not available); b. cath 2014: LM nl, pLAD 40%, mLAD nl, ost LCx 40%, mid LCx nl, pRCA 30% @ site of prior stent, mRCA 50%  . Diabetic neuropathy ([place])   . ESRD (end stage renal disease) on dialysis ([place])     M-W-F  . GERD (gastroesophageal reflux disease)   . Hx of pancreatitis 2015  . Myocardial infarction ([place])   . Pneumonia   . Mitral regurgitation     a. echo 10/2013: EF 62%, noWMA, mildly dilated LA, mild to mod MR/TR, GR1DD  . COPD (chronic obstructive pulmonary disease) ([place])   . chronic diastolic CHF [id number]  . dialysis 2006  .  Depression   . Anxiety   . Arthritis   . Headache   . Anemia   . Renal insufficiency     Patient Active Problem List   Diagnosis Date Noted  . Blood in stool   . Intractable cyclical vomiting with nausea   . Reflux esophagitis   . Gastritis   . Generalized abdominal pain   . Uncontrollable vomiting   . Major depressive disorder, recurrent episode, moderate ([place]) 03/15/2016  . Adjustment disorder with mixed anxiety and depressed mood 03/15/2016  . Malnutrition of moderate degree 12/01/2015  . Renal mass   . Dyspnea   . Acute renal failure ([place])   . Respiratory failure ([place])   . High temperature 11/14/2015  . Pulmonary edema   . Encounter for central line placement   . Encounter for orogastric (OG) tube placement   . Nausea 11/12/2015  . Hyperkalemia 10/03/2015  . C. difficile diarrhea 07/22/2015  . Pneumonia 05/21/2015  . Hypoglycemia 04/24/2015  . Unresponsiveness 04/24/2015  . Bradycardia 04/24/2015  . Hypothermia 04/24/2015  . Acute respiratory failure ([place]) 04/24/2015  . Acute diastolic CHF (congestive heart failure) ([place]) 04/05/2015  . Diabetic gastroparesis ([place]) 04/05/2015  . Hypokalemia 04/05/2015  . Generalized weakness 04/05/2015  . Acute pulmonary edema ([place]) 04/03/2015  . Nausea and vomiting 04/03/2015  . Hypoglycemia associated with diabetes ([place]) 04/03/2015  . Anemia of chronic disease 04/03/2015  . Secondary hyperparathyroidism ([place]) 04/03/2015  . Pressure ulcer 04/02/2015  .  Acute respiratory failure with hypoxia ([place]) 04/01/2015  . Adjustment disorder with anxiety 03/14/2015  . Somatic symptom disorder, mild 03/08/2015  . Coronary artery disease involving native coronary artery of native heart without angina pectoris   . Nausea & vomiting 03/06/2015  . Abdominal pain 03/06/2015  . DM (diabetes mellitus) ([place]) 03/06/2015  . HTN (hypertension) 03/06/2015  . Gastroparesis 02/24/2015  . Pleural effusion 02/19/2015  . HCAP (healthcare-associated  pneumonia) 02/19/2015  . End-stage renal disease on hemodialysis ([place]) 02/19/2015    Past Surgical History  Procedure Laterality Date  . Cholecystectomy    . Appendectomy    . Abdominal hysterectomy    . Dialysis fistula creation Left     upper arm  . Esophagogastroduodenoscopy N/A 03/08/2015    Procedure: ESOPHAGOGASTRODUODENOSCOPY (EGD);  Surgeon: Manya Silvas, MD;  Location[facility] ENDOSCOPY;  Service: Endoscopy;  Laterality: N/A;  . Cardiac catheterization Left 07/26/2015    Procedure: Left Heart Cath and Coronary Angiography;  Surgeon: Dionisio David, MD;  Location: [place] CV LAB;  Service: Cardiovascular;  Laterality: Left;  Marland Kitchen Eye surgery    . Fecal transplant N/A 08/23/2015    Procedure: FECAL TRANSPLANT;  Surgeon: Manya Silvas, MD;  Location: [facility] ENDOSCOPY;  Service: Endoscopy;  Laterality: N/A;  . Peripheral vascular catheterization N/A 12/20/2015    Procedure: Thrombectomy of dialysis access versus permcath placement;  Surgeon: Algernon Huxley, MD;  Location: [place] CV LAB;  Service: Cardiovascular;  Laterality: N/A;  . Peripheral vascular catheterization N/A 12/20/2015    Procedure: A/V Shunt Intervention;  Surgeon: Algernon Huxley, MD;  Location: [place] CV LAB;  Service: Cardiovascular;  Laterality: N/A;  . Peripheral vascular catheterization N/A 12/20/2015    Procedure: A/V Shuntogram/Fistulagram;  Surgeon: Algernon Huxley, MD;  Location: [place] CV LAB;  Service: Cardiovascular;  Laterality: N/A;  . Peripheral vascular catheterization N/A 01/02/2016    Procedure: A/V Shuntogram/Fistulagram;  Surgeon: Algernon Huxley, MD;  Location: [place] CV LAB;  Service: Cardiovascular;  Laterality: N/A;  . Peripheral vascular catheterization N/A 01/02/2016    Procedure: A/V Shunt Intervention;  Surgeon: Algernon Huxley, MD;  Location: [place] CV LAB;  Service: Cardiovascular;  Laterality: N/A;  . Esophagogastroduodenoscopy (egd) with propofol N/A 03/18/2016     Procedure: ESOPHAGOGASTRODUODENOSCOPY (EGD) WITH PROPOFOL;  Surgeon: [name], MD;  Location: [facility] ENDOSCOPY;  Service: Endoscopy;  Laterality: N/A;    Current Outpatient Rx  Name  Route  Sig  Dispense  Refill  . acidophilus (RISAQUAD) CAPS capsule   Oral   Take 1 capsule by mouth daily.         Marland Kitchen albuterol (PROVENTIL) (2.5 MG/3ML) 0.083% nebulizer solution   Nebulization   Take 2.5 mg by nebulization every 4 (four) hours as needed for wheezing or shortness of breath.         Marland Kitchen amLODipine (NORVASC) 10 MG tablet   Oral   Take 10 mg by mouth daily.         Marland Kitchen aspirin EC 81 MG tablet   Oral   Take 81 mg by mouth every morning.          Marland Kitchen atorvastatin (LIPITOR) 20 MG tablet   Oral   Take 20 mg by mouth every evening.         . B COMPLEX-C-FOLIC ACID PO   Oral   Take 1 tablet by mouth daily.         . bisacodyl (DULCOLAX) 5 MG EC tablet  Oral   Take 1 tablet (5 mg total) by mouth daily as needed for moderate constipation.   30 tablet   0   . cetirizine (ZYRTEC) 10 MG tablet   Oral   Take 10 mg by mouth every evening.          . cholecalciferol (VITAMIN D) 1000 units tablet   Oral   Take 1,000 Units by mouth daily.         . citalopram (CELEXA) 20 MG tablet   Oral   Take 20 mg by mouth every morning.          . clopidogrel (PLAVIX) 75 MG tablet   Oral   Take 75 mg by mouth daily. Reported on 12/17/2015         . hydrALAZINE (APRESOLINE) 25 MG tablet   Oral   Take 25 mg by mouth 3 (three) times daily.         Marland Kitchen HYDROcodone-acetaminophen (NORCO/VICODIN) 5-325 MG tablet   Oral   Take 1 tablet by mouth every 6 (six) hours as needed for moderate pain or severe pain.   20 tablet   0   . lipase/protease/amylase (CREON) 12000 UNITS CPEP capsule   Oral   Take 12,000-36,000 Units by mouth See admin instructions. Take 3 capsules (36,000 units) by mouth every morning, Take 2 capsules (24,000 units) by mouth at lunch, Take 3 capsules (36,000 units)  by mouth every night at bedtime, Then 1 capsule by mouth with snack.         . metoCLOPramide (REGLAN) 5 MG tablet   Oral   Take 1 tablet (5 mg total) by mouth 4 (four) times daily -  before meals and at bedtime.   120 tablet   2   . multivitamin (RENA-VIT) TABS tablet   Oral   Take 1 tablet by mouth daily.         . nitroGLYCERIN (NITROSTAT) 0.4 MG SL tablet   Sublingual   Place 0.4 mg under the tongue every 5 (five) minutes x 3 doses as needed for chest pain. *Max 3 doses per episode*         . omega-3 acid ethyl esters (LOVAZA) 1 g capsule   Oral   Take 1 g by mouth daily.         Marland Kitchen oxymetazoline (AFRIN) 0.05 % nasal spray   Each Nare   Place 2 sprays into both nostrils 2 (two) times daily.   15 mL   0   . pantoprazole (PROTONIX) 40 MG tablet   Oral   Take 1 tablet (40 mg total) by mouth 2 (two) times daily before a meal.   60 tablet   2   . promethazine (PHENERGAN) 25 MG tablet   Oral   Take 25 mg by mouth every 6 (six) hours as needed for nausea or vomiting.         . ranitidine (ZANTAC) 150 MG tablet   Oral   Take 150 mg by mouth every evening.          . ranolazine (RANEXA) 1000 MG SR tablet   Oral   Take 1,000 mg by mouth 2 (two) times daily.         . sevelamer carbonate (RENVELA) 800 MG tablet   Oral   Take 800 mg by mouth 3 (three) times daily with meals.          . vancomycin (VANCOCIN) 50 mg/mL oral solution   Oral   Take  2.5 mLs (125 mg total) by mouth every 6 (six) hours. X 8 more days   50 mL   0   . vitamin B-12 (CYANOCOBALAMIN) 1000 MCG tablet   Oral   Take 1,000 mcg by mouth daily.           Allergies Compazine; Ace inhibitors; Ativan; Codeine; Dilaudid; Gabapentin; Losartan; Ondansetron; Prochlorperazine; Scopolamine; Zofran; Oxycodone; and Tape  Family History  Problem Relation Age of Onset  . Kidney disease Mother   . Diabetes Mother     Social History Social History  Substance Use Topics  . Smoking  status: Former Smoker -- 0.50 packs/day    Types: Cigarettes    Quit date: 02/13/2015  . Smokeless tobacco: Never Used  . Alcohol Use: No    Review of Systems Constitutional: Negative for fever. Cardiovascular: Negative for chest pain. Respiratory: Negative for shortness of breath. Gastrointestinal: Right upper quadrant abdominal pain. Nausea and vomiting. Diarrhea 1. Genitourinary: Negative for dysuria. Neurological: Negative for headache 10-point ROS otherwise negative.  ____________________________________________   PHYSICAL EXAM:  VITAL SIGNS: ED Triage Vitals  Enc Vitals Group     BP --      Pulse Rate 04/04/16 0731 63     Resp 04/04/16 0731 18     Temp 04/04/16 0731 97.7 F (36.5 C)     Temp Source 04/04/16 0731 Oral     SpO2 04/04/16 0731 94 %     Weight 04/04/16 0731 160 lb (72.576 kg)     Height --      Head Cir --      Peak Flow --      Pain Score 04/04/16 0731 9     Pain Loc --      Pain Edu? --      Excl. in [place]? --     Constitutional: Alert and oriented. Well appearing and in no distress. Eyes: Normal exam ENT   Head: Normocephalic and atraumatic.   Mouth/Throat: Mucous membranes are moist. Cardiovascular: Normal rate, regular rhythm. No murmur Respiratory: Normal respiratory effort without tachypnea nor retractions. Breath sounds are clear  Gastrointestinal: Soft, moderate right upper quadrant tenderness palpation. No rebound or guarding. No distention. Musculoskeletal: Nontender with normal range of motion in all extremities.  Neurologic:  Normal speech and language. No gross focal neurologic deficits Skin:  Skin is warm, dry and intact.  Psychiatric: Mood and affect are normal.  ____________________________________________   RADIOLOGY  Right upper quadrant ultrasound normal.  EKG reviewed and interpreted by myself shows normal sinus rhythm at 63 bpm, narrow QRS, normal axis, low voltage nonspecific ST changes. No ST  elevations.  ____________________________________________   INITIAL IMPRESSION / ASSESSMENT AND PLAN / ED COURSE  Pertinent labs & imaging results that were available during my care of the patient were reviewed by me and considered in my medical decision making (see chart for details).  Patient presents the emergency department with nausea and vomiting since last night. Patient states a history of nausea and vomiting for which she takes Phenergan at home. Patient also stating that quadrant abdominal pain beginning this morning as well. We will check labs, treat her nausea with Reglan, and obtain an ultrasound of the right upper quadrant to further evaluate. Patient denies any chest pain or trouble breathing today.  ----------------------------------------- 8:32 AM on 04/04/2016 -----------------------------------------  Labs are largely within normal limits. Potassium 3.9. Right upper quadrant ultrasound pending.   Ultrasound normal. Labs are normal. Vitals are normal. Patient states she is feeling  much better after the Reglan. Patient's potassium is normal. She will follow-up for dialysis on Monday. I discussed return precautions for any further abdominal pain, vomiting, or fever. Patient agreeable to this plan.  ____________________________________________   FINAL CLINICAL IMPRESSION(S) / ED DIAGNOSES  Nausea and vomiting Right upper quadrant pain   Harvest Dark, MD 04/04/16 [phone]

## 2016-04-04 NOTE — ED Notes (Signed)
Pt placed on 2L [place] while lying down. Pt states that she wears oxygen at home while lying down intermittently.

## 2016-04-04 NOTE — ED Notes (Signed)
Patient transported to Ultrasound 

## 2016-04-04 NOTE — Discharge Instructions (Signed)
Abdominal Pain, Adult °Many things can cause belly (abdominal) pain. Most times, the belly pain is not dangerous. Many cases of belly pain can be watched and treated at home. °HOME CARE  °· Do not take medicines that help you go poop (laxatives) unless told to by your doctor. °· Only take medicine as told by your doctor. °· Eat or drink as told by your doctor. Your doctor will tell you if you should be on a special diet. °GET HELP IF: °· You do not know what is causing your belly pain. °· You have belly pain while you are sick to your stomach (nauseous) or have runny poop (diarrhea). °· You have pain while you pee or poop. °· Your belly pain wakes you up at night. °· You have belly pain that gets worse or better when you eat. °· You have belly pain that gets worse when you eat fatty foods. °· You have a fever. °GET HELP RIGHT AWAY IF:  °· The pain does not go away within 2 hours. °· You keep throwing up (vomiting). °· The pain changes and is only in the right or left part of the belly. °· You have bloody or tarry looking poop. °MAKE SURE YOU:  °· Understand these instructions. °· Will watch your condition. °· Will get help right away if you are not doing well or get worse. °  °This information is not intended to replace advice given to you by your health care provider. Make sure you discuss any questions you have with your health care provider. °  °Document Released: 02/25/2008 Document Revised: 09/29/2014 Document Reviewed: 05/18/2013 °Elsevier Interactive Patient Education ©2016 [employer] ° °Nausea and Vomiting °Nausea is a sick feeling that often comes before throwing up (vomiting). Vomiting is a reflex where stomach contents come out of your mouth. Vomiting can cause severe loss of body fluids (dehydration). Children and elderly adults can become dehydrated quickly, especially if they also have diarrhea. Nausea and vomiting are symptoms of a condition or disease. It is important to find the cause of your  symptoms. °CAUSES  °· Direct irritation of the stomach lining. This irritation can result from increased acid production (gastroesophageal reflux disease), infection, food poisoning, taking certain medicines (such as nonsteroidal anti-inflammatory drugs), alcohol use, or tobacco use. °· Signals from the brain. These signals could be caused by a headache, heat exposure, an inner ear disturbance, increased pressure in the brain from injury, infection, a tumor, or a concussion, pain, emotional stimulus, or metabolic problems. °· An obstruction in the gastrointestinal tract (bowel obstruction). °· Illnesses such as diabetes, hepatitis, gallbladder problems, appendicitis, kidney problems, cancer, sepsis, atypical symptoms of a heart attack, or eating disorders. °· Medical treatments such as chemotherapy and radiation. °· Receiving medicine that makes you sleep (general anesthetic) during surgery. °DIAGNOSIS °Your caregiver may ask for tests to be done if the problems do not improve after a few days. Tests may also be done if symptoms are severe or if the reason for the nausea and vomiting is not clear. Tests may include: °· Urine tests. °· Blood tests. °· Stool tests. °· Cultures (to look for evidence of infection). °· X-rays or other imaging studies. °Test results can help your caregiver make decisions about treatment or the need for additional tests. °TREATMENT °You need to stay well hydrated. Drink frequently but in small amounts. You may wish to drink water, sports drinks, clear broth, or eat frozen ice pops or gelatin dessert to help stay hydrated. When you eat, eating   slowly may help prevent nausea. There are also some antinausea medicines that may help prevent nausea. °HOME CARE INSTRUCTIONS  °· Take all medicine as directed by your caregiver. °· If you do not have an appetite, do not force yourself to eat. However, you must continue to drink fluids. °· If you have an appetite, eat a normal diet unless your  caregiver tells you differently. °¨ Eat a variety of complex carbohydrates (rice, wheat, potatoes, bread), lean meats, yogurt, fruits, and vegetables. °¨ Avoid high-fat foods because they are more difficult to digest. °· Drink enough water and fluids to keep your urine clear or pale yellow. °· If you are dehydrated, ask your caregiver for specific rehydration instructions. Signs of dehydration may include: °¨ Severe thirst. °¨ Dry lips and mouth. °¨ Dizziness. °¨ Dark urine. °¨ Decreasing urine frequency and amount. °¨ Confusion. °¨ Rapid breathing or pulse. °SEEK IMMEDIATE MEDICAL CARE IF:  °· You have blood or brown flecks (like coffee grounds) in your vomit. °· You have black or bloody stools. °· You have a severe headache or stiff neck. °· You are confused. °· You have severe abdominal pain. °· You have chest pain or trouble breathing. °· You do not urinate at least once every 8 hours. °· You develop cold or clammy skin. °· You continue to vomit for longer than 24 to 48 hours. °· You have a fever. °MAKE SURE YOU:  °· Understand these instructions. °· Will watch your condition. °· Will get help right away if you are not doing well or get worse. °  °This information is not intended to replace advice given to you by your health care provider. Make sure you discuss any questions you have with your health care provider. °  °Document Released: 09/08/2005 Document Revised: 12/01/2011 Document Reviewed: 02/05/2011 °Elsevier Interactive Patient Education ©2016 [employer] ° °

## 2016-04-04 NOTE — ED Notes (Signed)
MD at bedside. 

## 2016-04-04 NOTE — ED Notes (Signed)
Pt alert and oriented X4, active, cooperative, pt in NAD. RR even and unlabored, color WNL.  Pt informed to return if any life threatening symptoms occur.   

## 2016-04-04 NOTE — ED Notes (Signed)
Pt arrives to ER via [facility] c/o NVD, right sided abdominal pain. Pt unable to go to dialysis this AM due to sickness. Pt alert and oriented X4, active, cooperative, pt in NAD. RR even and unlabored, color WNL.

## 2016-04-07 ENCOUNTER — Emergency Department
Admission: EM | Admit: 2016-04-07 | Discharge: 2016-04-07 | Disposition: A | Discharge status: Home / Self Care | Payer: Medicare Other | Source: Home / Self Care | Attending: Student | Admitting: Student

## 2016-04-07 ENCOUNTER — Emergency Department
Admission: EM | Admit: 2016-04-07 | Discharge: 2016-04-07 | Disposition: A | Discharge status: Home / Self Care | Payer: Medicare Other | Source: Home / Self Care | Attending: Emergency Medicine | Admitting: Emergency Medicine

## 2016-04-07 ENCOUNTER — Encounter: Payer: Self-pay | Admitting: *Deleted

## 2016-04-07 ENCOUNTER — Emergency Department: Payer: Medicare Other

## 2016-04-07 ENCOUNTER — Encounter: Payer: Self-pay | Admitting: Emergency Medicine

## 2016-04-07 DIAGNOSIS — E875 Hyperkalemia: Secondary | ICD-10-CM | POA: Insufficient documentation

## 2016-04-07 DIAGNOSIS — J45909 Unspecified asthma, uncomplicated: Secondary | ICD-10-CM

## 2016-04-07 DIAGNOSIS — J449 Chronic obstructive pulmonary disease, unspecified: Secondary | ICD-10-CM | POA: Insufficient documentation

## 2016-04-07 DIAGNOSIS — I5032 Chronic diastolic (congestive) heart failure: Secondary | ICD-10-CM

## 2016-04-07 DIAGNOSIS — R1115 Cyclical vomiting syndrome unrelated to migraine: Secondary | ICD-10-CM

## 2016-04-07 DIAGNOSIS — Z79899 Other long term (current) drug therapy: Secondary | ICD-10-CM

## 2016-04-07 DIAGNOSIS — E11649 Type 2 diabetes mellitus with hypoglycemia without coma: Secondary | ICD-10-CM | POA: Insufficient documentation

## 2016-04-07 DIAGNOSIS — E1122 Type 2 diabetes mellitus with diabetic chronic kidney disease: Secondary | ICD-10-CM | POA: Insufficient documentation

## 2016-04-07 DIAGNOSIS — E1143 Type 2 diabetes mellitus with diabetic autonomic (poly)neuropathy: Secondary | ICD-10-CM

## 2016-04-07 DIAGNOSIS — Z7982 Long term (current) use of aspirin: Secondary | ICD-10-CM | POA: Insufficient documentation

## 2016-04-07 DIAGNOSIS — Z992 Dependence on renal dialysis: Secondary | ICD-10-CM | POA: Insufficient documentation

## 2016-04-07 DIAGNOSIS — N186 End stage renal disease: Secondary | ICD-10-CM | POA: Insufficient documentation

## 2016-04-07 DIAGNOSIS — M1990 Unspecified osteoarthritis, unspecified site: Secondary | ICD-10-CM

## 2016-04-07 DIAGNOSIS — G8929 Other chronic pain: Secondary | ICD-10-CM

## 2016-04-07 DIAGNOSIS — I132 Hypertensive heart and chronic kidney disease with heart failure and with stage 5 chronic kidney disease, or end stage renal disease: Secondary | ICD-10-CM

## 2016-04-07 DIAGNOSIS — I2510 Atherosclerotic heart disease of native coronary artery without angina pectoris: Secondary | ICD-10-CM

## 2016-04-07 DIAGNOSIS — Z87891 Personal history of nicotine dependence: Secondary | ICD-10-CM

## 2016-04-07 DIAGNOSIS — K7290 Hepatic failure, unspecified without coma: Secondary | ICD-10-CM | POA: Diagnosis not present

## 2016-04-07 DIAGNOSIS — F331 Major depressive disorder, recurrent, moderate: Secondary | ICD-10-CM | POA: Insufficient documentation

## 2016-04-07 DIAGNOSIS — I252 Old myocardial infarction: Secondary | ICD-10-CM | POA: Insufficient documentation

## 2016-04-07 DIAGNOSIS — E162 Hypoglycemia, unspecified: Secondary | ICD-10-CM

## 2016-04-07 DIAGNOSIS — G43A1 Cyclical vomiting, intractable: Secondary | ICD-10-CM

## 2016-04-07 DIAGNOSIS — E1140 Type 2 diabetes mellitus with diabetic neuropathy, unspecified: Secondary | ICD-10-CM | POA: Insufficient documentation

## 2016-04-07 DIAGNOSIS — F4323 Adjustment disorder with mixed anxiety and depressed mood: Secondary | ICD-10-CM | POA: Insufficient documentation

## 2016-04-07 DIAGNOSIS — E7220 Disorder of urea cycle metabolism, unspecified: Secondary | ICD-10-CM | POA: Diagnosis not present

## 2016-04-07 DIAGNOSIS — R109 Unspecified abdominal pain: Secondary | ICD-10-CM

## 2016-04-07 LAB — CBC
HCT: 28 % — ABNORMAL LOW (ref 35.0–47.0)
Hemoglobin: 9.4 g/dL — ABNORMAL LOW (ref 12.0–16.0)
MCH: 25 pg — ABNORMAL LOW (ref 26.0–34.0)
MCHC: 33.6 g/dL (ref 32.0–36.0)
MCV: 74.3 fL — ABNORMAL LOW (ref 80.0–100.0)
Platelets: 211 10*3/uL (ref 150–440)
RBC: 3.77 MIL/uL — ABNORMAL LOW (ref 3.80–5.20)
RDW: 22.2 % — ABNORMAL HIGH (ref 11.5–14.5)
WBC: 5.7 10*3/uL (ref 3.6–11.0)

## 2016-04-07 LAB — COMPREHENSIVE METABOLIC PANEL
ALT: 21 U/L (ref 14–54)
ALT: 17 U/L (ref 14–54)
AST: 55 U/L — ABNORMAL HIGH (ref 15–41)
AST: 27 U/L (ref 15–41)
Albumin: 3.1 g/dL — ABNORMAL LOW (ref 3.5–5.0)
Albumin: 3.3 g/dL — ABNORMAL LOW (ref 3.5–5.0)
Alkaline Phosphatase: 89 U/L (ref 38–126)
Alkaline Phosphatase: 98 U/L (ref 38–126)
Anion gap: 12 (ref 5–15)
Anion gap: 8 (ref 5–15)
BUN: 35 mg/dL — ABNORMAL HIGH (ref 6–20)
BUN: 11 mg/dL (ref 6–20)
CO2: 28 mmol/L (ref 22–32)
CO2: 33 mmol/L — ABNORMAL HIGH (ref 22–32)
Calcium: 8.5 mg/dL — ABNORMAL LOW (ref 8.9–10.3)
Calcium: 8 mg/dL — ABNORMAL LOW (ref 8.9–10.3)
Chloride: 88 mmol/L — ABNORMAL LOW (ref 101–111)
Chloride: 94 mmol/L — ABNORMAL LOW (ref 101–111)
Creatinine, Ser: 6.33 mg/dL — ABNORMAL HIGH (ref 0.44–1.00)
Creatinine, Ser: 2.78 mg/dL — ABNORMAL HIGH (ref 0.44–1.00)
GFR calc Af Amer: 8 mL/min — ABNORMAL LOW (ref >60)
GFR calc Af Amer: 21 mL/min — ABNORMAL LOW (ref >60)
GFR calc non Af Amer: 7 mL/min — ABNORMAL LOW (ref >60)
GFR calc non Af Amer: 18 mL/min — ABNORMAL LOW (ref >60)
Glucose, Bld: 81 mg/dL (ref 65–99)
Glucose, Bld: 44 mg/dL — CL (ref 65–99)
Potassium: 5.5 mmol/L — ABNORMAL HIGH (ref 3.5–5.1)
Potassium: 3.2 mmol/L — ABNORMAL LOW (ref 3.5–5.1)
Sodium: 128 mmol/L — ABNORMAL LOW (ref 135–145)
Sodium: 135 mmol/L (ref 135–145)
Total Bilirubin: 1 mg/dL (ref 0.3–1.2)
Total Bilirubin: 0.8 mg/dL (ref 0.3–1.2)
Total Protein: 7.6 g/dL (ref 6.5–8.1)
Total Protein: 8 g/dL (ref 6.5–8.1)

## 2016-04-07 LAB — CBC WITH DIFFERENTIAL/PLATELET
Band Neutrophils: 0 %
Basophils Absolute: 0 10*3/uL (ref 0–0.1)
Basophils Relative: 0 %
Blasts: 0 %
Eosinophils Absolute: 0.3 10*3/uL (ref 0–0.7)
Eosinophils Relative: 8 %
HCT: 28.7 % — ABNORMAL LOW (ref 35.0–47.0)
Hemoglobin: 9.2 g/dL — ABNORMAL LOW (ref 12.0–16.0)
Lymphocytes Relative: 37 %
Lymphs Abs: 1.2 10*3/uL (ref 1.0–3.6)
MCH: 24 pg — ABNORMAL LOW (ref 26.0–34.0)
MCHC: 32 g/dL (ref 32.0–36.0)
MCV: 75.1 fL — ABNORMAL LOW (ref 80.0–100.0)
Metamyelocytes Relative: 0 %
Monocytes Absolute: 0.1 10*3/uL — ABNORMAL LOW (ref 0.2–0.9)
Monocytes Relative: 4 %
Myelocytes: 0 %
Neutro Abs: 1.6 10*3/uL (ref 1.4–6.5)
Neutrophils Relative %: 51 %
Other: 0 %
Platelets: 197 10*3/uL (ref 150–440)
Promyelocytes Absolute: 0 %
RBC: 3.82 MIL/uL (ref 3.80–5.20)
RDW: 21.6 % — ABNORMAL HIGH (ref 11.5–14.5)
WBC: 3.2 10*3/uL — ABNORMAL LOW (ref 3.6–11.0)
nRBC: 0 /100 WBC

## 2016-04-07 LAB — LIPASE, BLOOD: Lipase: 11 U/L (ref 11–51)

## 2016-04-07 LAB — GLUCOSE, CAPILLARY
Glucose-Capillary: 42 mg/dL — CL (ref 65–99)
Glucose-Capillary: 153 mg/dL — ABNORMAL HIGH (ref 65–99)
Glucose-Capillary: 77 mg/dL (ref 65–99)
Glucose-Capillary: 102 mg/dL — ABNORMAL HIGH (ref 65–99)

## 2016-04-07 LAB — TROPONIN I: Troponin I: <0.03 ng/mL (ref <0.03)

## 2016-04-07 LAB — MAGNESIUM: Magnesium: 2.2 mg/dL (ref 1.7–2.4)

## 2016-04-07 MED ORDER — NALOXONE HCL 2 MG/2ML IJ SOSY
0.4000 mg | PREFILLED_SYRINGE | Freq: Once | INTRAMUSCULAR | Status: AC
  Administered 2016-04-07: 0.4 mg via INTRAMUSCULAR

## 2016-04-07 MED ORDER — DEXTROSE 50 % IV SOLN
INTRAVENOUS | Status: AC
  Administered 2016-04-07: 50 mL via INTRAVENOUS
  Filled 2016-04-07: qty 50

## 2016-04-07 MED ORDER — NALOXONE HCL 2 MG/2ML IJ SOSY: 0.4000 mg | PREFILLED_SYRINGE | Freq: Once | INTRAMUSCULAR | Status: DC

## 2016-04-07 MED ORDER — ONDANSETRON HCL 4 MG/2ML IJ SOLN
4.0000 mg | Freq: Once | INTRAMUSCULAR | Status: AC
  Administered 2016-04-07: 4 mg via INTRAVENOUS
  Filled 2016-04-07: qty 2

## 2016-04-07 MED ORDER — DEXTROSE 50 % IV SOLN: 1.0000 | Freq: Once | INTRAVENOUS | Status: DC

## 2016-04-07 MED ORDER — HYDROMORPHONE HCL 1 MG/ML IJ SOLN
1.0000 mg | Freq: Once | INTRAMUSCULAR | Status: AC
  Administered 2016-04-07: 1 mg via INTRAVENOUS
  Filled 2016-04-07: qty 1

## 2016-04-07 MED ORDER — DEXTROSE 50 % IV SOLN
1.0000 | Freq: Once | INTRAVENOUS | Status: AC
  Administered 2016-04-07: 50 mL via INTRAVENOUS

## 2016-04-07 MED ORDER — MAGNESIUM SULFATE IN D5W 1-5 GM/100ML-% IV SOLN
1.0000 g | Freq: Once | INTRAVENOUS | Status: DC
  Filled 2016-04-07: qty 100

## 2016-04-07 MED ORDER — NALOXONE HCL 2 MG/2ML IJ SOSY
PREFILLED_SYRINGE | INTRAMUSCULAR | Status: AC
  Administered 2016-04-07: 0.4 mg via INTRAMUSCULAR
  Filled 2016-04-07: qty 2

## 2016-04-07 NOTE — ED Notes (Signed)
Pt states that she is starting to relax, warm blanket given and lights turned down for comfort, pt has call bell within reach and oriented to use as needed, cont to monitor

## 2016-04-07 NOTE — ED Provider Notes (Signed)
[facility] Emergency Department Provider Note   ____________________________________________  Time seen: Approximately 1:27 PM  I have reviewed the triage vital signs and the nursing notes.   HISTORY  Chief Complaint Altered Mental Status  Caveat-history of present illness and review of systems is limited due to the patient's poor understanding of what happened prior to her coming to the ER.  HPI [name] is a 55 y.o. female with past medical history of asthma and diabetes hypertension MI COPD endstage renal disease on hemodialysis Monday Wednesday Friday, chronic abdominal pain as well as nausea and vomiting who presents from dialysis for altered mental status and hypoglycemia. Patient was discharged from this emergency department early this morning after treatment for her chronic abdominal pain and nausea and vomiting. She went to dialysis where after her full treatment was received she was noted to have decreased mental status. Her blood sugar was in the 60s with a were unable to obtain access to provide any treatment so they sent her to the emergency department. No other history is obtained.   Past Medical History  Diagnosis Date  . Asthma   . Collagen vascular disease ([place])   . Diabetes mellitus without complication ([place])   . Hypertension   . Coronary artery disease     a. cath 2013: stenting to RCA (report not available); b. cath 2014: LM nl, pLAD 40%, mLAD nl, ost LCx 40%, mid LCx nl, pRCA 30% @ site of prior stent, mRCA 50%  . Diabetic neuropathy ([place])   . ESRD (end stage renal disease) on dialysis ([place])     M-W-F  . GERD (gastroesophageal reflux disease)   . Hx of pancreatitis 2015  . Myocardial infarction ([place])   . Pneumonia   . Mitral regurgitation     a. echo 10/2013: EF 62%, noWMA, mildly dilated LA, mild to mod MR/TR, GR1DD  . COPD (chronic obstructive pulmonary disease) ([place])   . chronic diastolic CHF [id number]  . dialysis 2006  .  Depression   . Anxiety   . Arthritis   . Headache   . Anemia   . Renal insufficiency     Patient Active Problem List   Diagnosis Date Noted  . Blood in stool   . Intractable cyclical vomiting with nausea   . Reflux esophagitis   . Gastritis   . Generalized abdominal pain   . Uncontrollable vomiting   . Major depressive disorder, recurrent episode, moderate ([place]) 03/15/2016  . Adjustment disorder with mixed anxiety and depressed mood 03/15/2016  . Malnutrition of moderate degree 12/01/2015  . Renal mass   . Dyspnea   . Acute renal failure ([place])   . Respiratory failure ([place])   . High temperature 11/14/2015  . Pulmonary edema   . Encounter for central line placement   . Encounter for orogastric (OG) tube placement   . Nausea 11/12/2015  . Hyperkalemia 10/03/2015  . C. difficile diarrhea 07/22/2015  . Pneumonia 05/21/2015  . Hypoglycemia 04/24/2015  . Unresponsiveness 04/24/2015  . Bradycardia 04/24/2015  . Hypothermia 04/24/2015  . Acute respiratory failure ([place]) 04/24/2015  . Acute diastolic CHF (congestive heart failure) ([place]) 04/05/2015  . Diabetic gastroparesis ([place]) 04/05/2015  . Hypokalemia 04/05/2015  . Generalized weakness 04/05/2015  . Acute pulmonary edema ([place] ) 04/03/2015  . Nausea and vomiting 04/03/2015  . Hypoglycemia associated with diabetes ([place]) 04/03/2015  . Anemia of chronic disease 04/03/2015  . Secondary hyperparathyroidism ([place]) 04/03/2015  . Pressure ulcer 04/02/2015  . Acute  respiratory failure with hypoxia ([place]) 04/01/2015  . Adjustment disorder with anxiety 03/14/2015  . Somatic symptom disorder, mild 03/08/2015  . Coronary artery disease involving native coronary artery of native heart without angina pectoris   . Nausea & vomiting 03/06/2015  . Abdominal pain 03/06/2015  . DM (diabetes mellitus) ([place]) 03/06/2015  . HTN (hypertension) 03/06/2015  . Gastroparesis 02/24/2015  . Pleural effusion 02/19/2015  . HCAP (healthcare-associated  pneumonia) 02/19/2015  . End-stage renal disease on hemodialysis ([place]) 02/19/2015    Past Surgical History  Procedure Laterality Date  . Cholecystectomy    . Appendectomy    . Abdominal hysterectomy    . Dialysis fistula creation Left     upper arm  . Esophagogastroduodenoscopy N/A 03/08/2015    Procedure: ESOPHAGOGASTRODUODENOSCOPY (EGD);  Surgeon: Manya Silvas, MD;  Location: [facility] ENDOSCOPY;  Service: Endoscopy;  Laterality: N/A;  . Cardiac catheterization Left 07/26/2015    Procedure: Left Heart Cath and Coronary Angiography;  Surgeon: Dionisio David, MD;  Location: [place] CV LAB;  Service: Cardiovascular;  Laterality: Left;  Marland Kitchen Eye surgery    . Fecal transplant N/A 08/23/2015    Procedure: FECAL TRANSPLANT;  Surgeon: Manya Silvas, MD;  Location: [facility] ENDOSCOPY;  Service: Endoscopy;  Laterality: N/A;  . Peripheral vascular catheterization N/A 12/20/2015    Procedure: Thrombectomy of dialysis access versus permcath placement;  Surgeon: Algernon Huxley, MD;  Location: [place] [facility] LAB;  Service: Cardiovascular;  Laterality: N/A;  . Peripheral vascular catheterization N/A 12/20/2015    Procedure: A/V Shunt Intervention;  Surgeon: Algernon Huxley, MD;  Location: [place] [facility] LAB;  Service: Cardiovascular;  Laterality: N/A;  . Peripheral vascular catheterization N/A 12/20/2015    Procedure: A/V Shuntogram/Fistulagram;  Surgeon: Algernon Huxley, MD;  Location: [place] [facility] LAB;  Service: Cardiovascular;  Laterality: N/A;  . Peripheral vascular catheterization N/A 01/02/2016    Procedure: A/V Shuntogram/Fistulagram;  Surgeon: Algernon Huxley, MD;  Location: [place] [facility] LAB;  Service: Cardiovascular;  Laterality: N/A;  . Peripheral vascular catheterization N/A 01/02/2016    Procedure: A/V Shunt Intervention;  Surgeon: Algernon Huxley, MD;  Location: [place] [facility];  Service: Cardiovascular;  Laterality: N/A;  . Esophagogastroduodenoscopy (egd) with propofol N/A 03/18/2016     Procedure: ESOPHAGOGASTRODUODENOSCOPY (EGD) WITH PROPOFOL;  Surgeon: [name], MD;  Location: [facility] ENDOSCOPY;  Service: Endoscopy;  Laterality: N/A;    Current Outpatient Rx  Name  Route  Sig  Dispense  Refill  . acidophilus (RISAQUAD) CAPS capsule   Oral   Take 1 capsule by mouth daily.         Marland Kitchen albuterol (PROVENTIL HFA;VENTOLIN HFA) 108 (90 Base) MCG/ACT inhaler   Inhalation   Inhale 2 puffs into the lungs every 4 (four) hours as needed for wheezing or shortness of breath.         Marland Kitchen albuterol (PROVENTIL) (2.5 MG/3ML) 0.083% nebulizer solution   Nebulization   Take 2.5 mg by nebulization every 4 (four) hours as needed for wheezing or shortness of breath.         Marland Kitchen amLODipine (NORVASC) 10 MG tablet   Oral   Take 10 mg by mouth daily.         Marland Kitchen aspirin EC 81 MG tablet   Oral   Take 81 mg by mouth every morning.          Marland Kitchen atorvastatin (LIPITOR) 20 MG tablet   Oral   Take 20 mg by mouth every  evening.         . cetirizine (ZYRTEC) 10 MG tablet   Oral   Take 10 mg by mouth daily.          . citalopram (CELEXA) 20 MG tablet   Oral   Take 20 mg by mouth daily.          . clopidogrel (PLAVIX) 75 MG tablet   Oral   Take 75 mg by mouth daily. Reported on 12/17/2015         . glucose 4 GM chewable tablet   Oral   Chew 3-4 tablets by mouth as needed for low blood sugar.         . hydrALAZINE (APRESOLINE) 25 MG tablet   Oral   Take 25 mg by mouth 3 (three) times daily.         Marland Kitchen HYDROcodone-acetaminophen (NORCO/VICODIN) 5-325 MG tablet   Oral   Take 1 tablet by mouth every 6 (six) hours as needed for moderate pain or severe pain. Patient taking differently: Take 1 tablet by mouth 2 (two) times daily as needed for moderate pain or severe pain.    20 tablet   0   . lipase/protease/amylase (CREON) 12000 UNITS CPEP capsule   Oral   Take 12,000-36,000 Units by mouth See admin instructions. 36,000 units daily with breakfast; 24,000 units daily with  lunch, and 36,000 units daily with dinner         . metoCLOPramide (REGLAN) 5 MG tablet   Oral   Take 5 mg by mouth 4 (four) times daily.         . multivitamin (RENA-VIT) TABS tablet   Oral   Take 1 tablet by mouth daily.         Marland Kitchen omega-3 acid ethyl esters (LOVAZA) 1 g capsule   Oral   Take 1 g by mouth daily.         . pantoprazole (PROTONIX) 40 MG tablet   Oral   Take 1 tablet (40 mg total) by mouth 2 (two) times daily before a meal.   60 tablet   2   . pregabalin (LYRICA) 25 MG capsule   Oral   Take 25 mg by mouth daily.         . promethazine (PHENERGAN) 12.5 MG tablet   Oral   Take 12.5 mg by mouth every 8 (eight) hours as needed for nausea.         . ranitidine (ZANTAC) 150 MG tablet   Oral   Take 150 mg by mouth daily.          . ranolazine (RANEXA) 1000 MG SR tablet   Oral   Take 1,000 mg by mouth 2 (two) times daily.         . sevelamer carbonate (RENVELA) 800 MG tablet   Oral   Take 800 mg by mouth 3 (three) times daily with meals.          . vitamin B-12 (CYANOCOBALAMIN) 1000 MCG tablet   Oral   Take 1,000 mcg by mouth daily.         . metoCLOPramide (REGLAN) 10 MG tablet   Oral   Take 1 tablet (10 mg total) by mouth every 6 (six) hours as needed for nausea. Patient not taking: Reported on 04/07/2016   20 tablet   0   . vancomycin (VANCOCIN) 50 mg/mL oral solution   Oral   Take 2.5 mLs (125 mg total) by mouth every 6 (six)  hours. X 8 more days Patient not taking: Reported on 04/07/2016   50 mL   0     Allergies Compazine; Ace inhibitors; Ativan; Codeine; Dilaudid; Gabapentin; Losartan; Ondansetron; Prochlorperazine; Scopolamine; Zofran; Oxycodone; and Tape  Family History  Problem Relation Age of Onset  . Kidney disease Mother   . Diabetes Mother     Social History Social History  Substance Use Topics  . Smoking status: Former Smoker -- 0.50 packs/day    Types: Cigarettes    Quit date: 02/13/2015  . Smokeless  tobacco: Never Used  . Alcohol Use: No    Review of Systems  Caveat-history of present illness and review of systems is limited due to the patient's poor understanding of what happened prior to her coming to the ER. ____________________________________________   PHYSICAL EXAM: Filed Vitals:   04/07/16 1430 04/07/16 1600 04/07/16 1630 04/07/16 1735  BP: 111/76 117/50 119/51 114/51  Pulse: 59 115 60 56  Temp:      TempSrc:      Resp: 22 18 18 16   Height:      Weight:      SpO2: 100% 96% 95% 95%   VITAL SIGNS: ED Triage Vitals  Enc Vitals Group     BP --      Pulse --      Resp --      Temp --      Temp src --      SpO2 --      Weight --      Height --      Head Cir --      Peak Flow --      Pain Score --      Pain Loc --      Pain Edu? --      Excl. in [place]? --     Constitutional: Sleeping but awakens to painful stimulus and follows commands to move extremities. Eyes: Conjunctivae are normal. PERRL. EOMI. pupils 1-2 mm, reactive bilaterally. Head: Atraumatic. Nose: No congestion/rhinnorhea. Mouth/Throat: Mucous membranes are moist.  Oropharynx non-erythematous. Neck: No stridor. Supple without meningismus. Cardiovascular: Normal rate, regular rhythm. Grossly normal heart sounds.  Good peripheral circulation. Respiratory: Normal respiratory effort.  No retractions. Lungs CTAB. Gastrointestinal: Soft and nontender. No distention.  No CVA tenderness. Genitourinary: deferred Musculoskeletal: No lower extremity tenderness nor edema.  No joint effusions. Neurologic:  Follows commands to move all extremities which she does not take seen equally after which she falls asleep again. Skin:  Skin is warm, dry and intact. No rash noted. Psychiatric: Unable to assess.  ____________________________________________   LABS (all labs ordered are listed, but only abnormal results are displayed)  Labs Reviewed  GLUCOSE, CAPILLARY - Abnormal; Notable for the following:     Glucose-Capillary 42 (*)    All other components within normal limits  CBC WITH DIFFERENTIAL/PLATELET - Abnormal; Notable for the following:    WBC 3.2 (*)    Hemoglobin 9.2 (*)    HCT 28.7 (*)    MCV 75.1 (*)    MCH 24.0 (*)    RDW 21.6 (*)    Monocytes Absolute 0.1 (*)    All other components within normal limits  COMPREHENSIVE METABOLIC PANEL - Abnormal; Notable for the following:    Potassium 3.2 (*)    Chloride 94 (*)    CO2 33 (*)    Glucose, Bld 44 (*)    Creatinine, Ser 2.78 (*)    Calcium 8.0 (*)  Albumin 3.3 (*)    GFR calc non Af Amer 18 (*)    GFR calc Af Amer 21 (*)    All other components within normal limits  GLUCOSE, CAPILLARY - Abnormal; Notable for the following:    Glucose-Capillary 153 (*)    All other components within normal limits  GLUCOSE, CAPILLARY - Abnormal; Notable for the following:    Glucose-Capillary 102 (*)    All other components within normal limits  TROPONIN I  GLUCOSE, CAPILLARY  MAGNESIUM   ____________________________________________  EKG  ED ECG REPORT I, Joanne Gavel, the attending physician, personally viewed and interpreted this ECG.   Date: 04/07/2016  EKG Time: 13:31  Rate: 61  Rhythm: normal sinus rhythm  Axis: borderline left axis  Intervals: Nonspecific intraventricular conduction delay.  ST&T Change: No acute ST elevation or acute ST depression. Nonspecific T-wave abnormality diffusely. Prolonged QTC at 562 ms. Unchanged from 04/05/16.  ED ECG REPORT I, Joanne Gavel, the attending physician, personally viewed and interpreted this ECG.   Date: 04/07/2016  EKG Time: 16:51  Rate: 57  Rhythm: sinus bradycardia  Axis: borderline left axis  Intervals: Non-specific intraventricular conduction delay.  ST&T Change: No acute ST elevation or acute ST depression. Nonspecific T-wave abnormality diffusely. Mildly Prolonged QTC at 516 ms which is improved from  prior.   ____________________________________________  RADIOLOGY  CXR IMPRESSION: Bilateral small pleural effusion again noted. No convincing pulmonary edema. Worsening atelectasis or infiltrate in right middle lobe. ____________________________________________   PROCEDURES  Procedure(s) performed: None  Procedures  Critical Care performed: No  ____________________________________________   INITIAL IMPRESSION / ASSESSMENT AND PLAN / ED COURSE  Pertinent labs & imaging results that were available during my care of the patient were reviewed by me and considered in my medical decision making (see chart for details).  [name] is a 55 y.o. female with past medical history of asthma and diabetes hypertension MI COPD endstage renal disease on hemodialysis Monday Wednesday Friday, chronic abdominal pain as well as nausea and vomiting who presents from dialysis for altered mental status and hypoglycemia. On arrival to the emergency department she appeared somnolence, her blood sugar was in the 40s. She received an amp of D50 with improvement of her mental status. Initially, because of her small pupils, she did receive Narcan without any real improvement of her symptoms. Suspect her hypoglycemia is secondary to her not eating today. We will feed her, check serial glucose levels, obtain screening labs and reassess for disposition.  ----------------------------------------- 3:48 PM on 04/07/2016 ----------------------------------------- Patient is sitting up in bed eating cookies and drinking Coca-Cola. She is not requiring any oxygen. He confirms that since leaving the emergency department early this morning and having dialysis she has not eaten anything prior to right now.  ----------------------------------------- 5:34 PM on 04/07/2016 ----------------------------------------- Patient has had stable blood glucose for over 3 hours. She is had no complaint of abdominal pain and has  not had any vomiting throughout this ER visit. I reviewed her labs. CBC with mild stable anemia, mild leukopenia at 3.2. CMP shows creatinine elevation at 2.7  as expected in the setting of end-stage renal disease. Troponin negative. Normal magnesium. Her QTC has narrowed from 562 to 516 ms since arrival to the emergency department. Yesterday her QTC was as long as 707 ms. I would recommend avoidance of QTC prolongation medications. Chest x-ray shows chronic small pleural effusions as well as likely worsening atelectasis in the right middle lobe. The patient denies any  cough or fever, she is not tachypneic here no increased work of breathing, no new oxygen requirement and I doubt pneumonia. We discussed return precautions, need for close PCP follow-up and she is comfortable with the discharge plan. DC home.   ____________________________________________   FINAL CLINICAL IMPRESSION(S) / ED DIAGNOSES  Final diagnoses:  Hypoglycemia      NEW MEDICATIONS STARTED DURING THIS VISIT:  New Prescriptions   No medications on file     Note:  This document was prepared using Dragon voice recognition software and may include unintentional dictation errors.    Joanne Gavel, MD 04/07/16 1745

## 2016-04-07 NOTE — ED Notes (Signed)
Pt states that her son gets off work at 0700 and will be coming to get her at that time. Pt has called her dialysis center and told them that she would be late but is coming.

## 2016-04-07 NOTE — ED Notes (Addendum)
Pt presents w/ c/o n/v and lower abdominal pain starting yesterday at 1630.  Pt states she is 'jerking' and as she reports this, she has voluntary muscle stiffening. Pt vomiting just prior to arrival via EMS. Pt has not been to dialysis since this past Wed. Pt has generalized edema.

## 2016-04-07 NOTE — ED Notes (Signed)
Patient left for dialysis to be deaccessed. Patient transported by tech.

## 2016-04-07 NOTE — Discharge Instructions (Signed)
Chronic Pain  Chronic pain can be defined as pain that is off and on and lasts for 3-6 months or longer. Many things cause chronic pain, which can make it difficult to make a diagnosis. There are many treatment options available for chronic pain. However, finding a treatment that works well for you may require trying various approaches until the right one is found. Many people benefit from a combination of two or more types of treatment to control their pain.  SYMPTOMS   Chronic pain can occur anywhere in the body and can range from mild to very severe. Some types of chronic pain include:  · Headache.  · Low back pain.  · Cancer pain.  · Arthritis pain.  · Neurogenic pain. This is pain resulting from damage to nerves.   People with chronic pain may also have other symptoms such as:  · Depression.  · Anger.  · Insomnia.  · Anxiety.  DIAGNOSIS   Your health care provider will help diagnose your condition over time. In many cases, the initial focus will be on excluding possible conditions that could be causing the pain. Depending on your symptoms, your health care provider may order tests to diagnose your condition. Some of these tests may include:   · Blood tests.    · CT scan.    · MRI.    · X-rays.    · Ultrasounds.    · Nerve conduction studies.    You may need to see a specialist.   TREATMENT   Finding treatment that works well may take time. You may be referred to a pain specialist. He or she may prescribe medicine or therapies, such as:   · Mindful meditation or yoga.  · Shots (injections) of numbing or pain-relieving medicines into the spine or area of pain.  · Local electrical stimulation.  · Acupuncture.    · Massage therapy.    · Aroma, color, light, or sound therapy.    · Biofeedback.    · Working with a physical therapist to keep from getting stiff.    · Regular, gentle exercise.    · Cognitive or behavioral therapy.    · Group support.    Sometimes, surgery may be recommended.   HOME CARE INSTRUCTIONS    · Take all medicines as directed by your health care provider.    · Lessen stress in your life by relaxing and doing things such as listening to calming music.    · Exercise or be active as directed by your health care provider.    · Eat a healthy diet and include things such as vegetables, fruits, fish, and lean meats in your diet.    · Keep all follow-up appointments with your health care provider.    · Attend a support group with others suffering from chronic pain.  SEEK MEDICAL CARE IF:   · Your pain gets worse.    · You develop a new pain that was not there before.    · You cannot tolerate medicines given to you by your health care provider.    · You have new symptoms since your last visit with your health care provider.    SEEK IMMEDIATE MEDICAL CARE IF:   · You feel weak.    · You have decreased sensation or numbness.    · You lose control of bowel or bladder function.    · Your pain suddenly gets much worse.    · You develop shaking.  · You develop chills.  · You develop confusion.  · You develop chest pain.  · You develop shortness of breath.    MAKE SURE YOU:  ·   Document Revised: 05/11/2013 Document Reviewed: 03/04/2013 Elsevier Interactive Patient Education 2016 Elsevier Inc.  Hyperkalemia Hyperkalemia is when you have too much potassium in your blood. Potassium is normally removed (excreted) from your body by your kidneys. If there is too much potassium in your blood, it can affect your heart's ability to function.  CAUSES  Hyperkalemia may be caused by:   Taking in too much potassium. You can do this by:  Using salt substitutes. They contain large amounts of  potassium.  Taking potassium supplements.  Eating foods high in potassium.  Excreting too little potassium. This can happen if:  Your kidneys are not working properly. Kidney (renal) disease, including short- or long-term renal failure, is a very common cause of hyperkalemia.  You are taking medicines that lower your excretion of potassium.  You have Addison disease.  You have a urinary tract blockage, such as kidney stones.  You are on treatment to mechanically clean your blood (dialysis) and you skip a treatment.  Releasing a high amount of potassium from your cells into your blood. This can happen with:  Injury to muscles (rhabdomyolysis) or other tissues. Most potassium is stored in your muscles.  Severe burns or infections.  Acidic blood plasma (acidosis). Acidosis can result from many diseases, such as uncontrolled diabetes. RISK FACTORS The most common risk factor of hyperkalemia is kidney disease. Other risk factors of hyperkalemia include:  Addison disease. This is a condition where your glands do not produce enough hormones.  Alcoholism or heavy drug use.   Using certain blood pressure medicines, such as angiotensin-converting enzyme (ACE) inhibitors, angiotensin II receptor blockers (ARBs), or potassium-sparing diuretics such as spironolactone.  Severe injury or burn. SIGNS AND SYMPTOMS  Oftentimes, there are no signs or symptoms of hyperkalemia. However, when your potassium level becomes high enough, you may experience symptoms such as:  Irregular or very slow heartbeat.  Nausea.  Fatigue.  Tingling of the skin or numbness of the hands or feet.  Muscle weakness.  Fatigue.  Not being able to move (paralysis). You may not have any symptoms of hyperkalemia.  DIAGNOSIS  Hyperkalemia may be diagnosed by:  Physical exam.  Blood tests.  ECG (electrocardiogram).  Discussion of prescription and non-prescription drug use. TREATMENT  Treatment for  hyperkalemia is often directed at the underlying cause. In some instances, treatment may include:   Insulin.  Glucose (sugar) and water solution given through a vein (intravenous or IV).  Dialysis.  Medicines to remove the potassium from your body.  Medicines to move calcium from your bloodstream into your tissues. HOME CARE INSTRUCTIONS   Take medicines only as directed by your health care provider.  Do not take any supplements, natural products, herbs, or vitamins without reviewing them with your health care provider. Certain supplements and natural food products can have high amounts of potassium.  Limit your alcohol intake as directed by your health care provider.  Stop illegal drug use. If you need help quitting, ask your health care provider.  Keep all follow-up visits as directed by your health care provider. This is important.  If you have kidney disease, you may need to follow a low potassium diet. A dietitian can help educate you on low potassium foods. SEEK MEDICAL CARE IF:   You notice an irregular or very slow heartbeat.  You feel light-headed.  You feel weak.  You are nauseous.  You have tingling or numbness in your hands or feet. SEEK IMMEDIATE MEDICAL CARE IF:   You have  shortness of breath.  You have chest pain or discomfort.  You pass out.  You have muscle paralysis. MAKE SURE YOU:   Understand these instructions.  Will watch your condition.  Will get help right away if you are not doing well or get worse.   This information is not intended to replace advice given to you by your health care provider. Make sure you discuss any questions you have with your health care provider.   Document Released: 08/29/2002 Document Revised: 09/29/2014 Document Reviewed: 12/14/2013 Elsevier Interactive Patient Education [employer].

## 2016-04-07 NOTE — ED Notes (Signed)
Pt to hemodialysis this morning after leaving [facility] ED for n/v, where she received her full treatment (removed 5.9).  Pt was alert and oriented x3 at start of arrival, but showed decreased level of consciousness after treatment was over.  EMS reports BG of 64, but unable to give d50% due to no IV access possible.  Pt responds to verbal and painful stimuli on arrival to ED

## 2016-04-07 NOTE — ED Provider Notes (Signed)
[facility] Emergency Department Provider Note  ____________________________________________  Time seen: 3:00 AM  I have reviewed the triage vital signs and the nursing notes.   HISTORY  Chief Complaint Abdominal Pain and Emesis      HPI [name] is a 55 y.o. female with past medical history of asthma and diabetes hypertension MI COPD endstage renal disease on hemodialysis Monday Wednesday Friday presents to the emergency department with nausea and vomiting times one day. Patient states that her nausea is not controlled with home Phenergan which prompted her visit to the emergency department tonight patient also states that she has missed dialysis on Friday. Patient also admits to 7 out of 10 generalized abdominal pain which she states is consistent with her chronic abdominal pain.      Past Medical History  Diagnosis Date  . Asthma   . Collagen vascular disease ([place])   . Diabetes mellitus without complication ([place])   . Hypertension   . Coronary artery disease     a. cath 2013: stenting to RCA (report not available); b. cath 2014: LM nl, pLAD 40%, mLAD nl, ost LCx 40%, mid LCx nl, pRCA 30% @ site of prior stent, mRCA 50%  . Diabetic neuropathy ([place])   . ESRD (end stage renal disease) on dialysis ([place])     M-W-F  . GERD (gastroesophageal reflux disease)   . Hx of pancreatitis 2015  . Myocardial infarction ([place])   . Pneumonia   . Mitral regurgitation     a. echo 10/2013: EF 62%, noWMA, mildly dilated LA, mild to mod MR/TR, GR1DD  . COPD (chronic obstructive pulmonary disease) ([place])   . chronic diastolic CHF [id number]  . dialysis 2006  . Depression   . Anxiety   . Arthritis   . Headache   . Anemia   . Renal insufficiency     Patient Active Problem List   Diagnosis Date Noted  . Blood in stool   . Intractable cyclical vomiting with nausea   . Reflux esophagitis   . Gastritis   . Generalized abdominal pain   . Uncontrollable vomiting   .  Major depressive disorder, recurrent episode, moderate ([place]) 03/15/2016  . Adjustment disorder with mixed anxiety and depressed mood 03/15/2016  . Malnutrition of moderate degree 12/01/2015  . Renal mass   . Dyspnea   . Acute renal failure ([place])   . Respiratory failure ([place])   . High temperature 11/14/2015  . Pulmonary edema   . Encounter for central line placement   . Encounter for orogastric (OG) tube placement   . Nausea 11/12/2015  . Hyperkalemia 10/03/2015  . C. difficile diarrhea 07/22/2015  . Pneumonia 05/21/2015  . Hypoglycemia 04/24/2015  . Unresponsiveness 04/24/2015  . Bradycardia 04/24/2015  . Hypothermia 04/24/2015  . Acute respiratory failure ([place]) 04/24/2015  . Acute diastolic CHF (congestive heart failure) ([place]) 04/05/2015  . Diabetic gastroparesis ([place]) 04/05/2015  . Hypokalemia 04/05/2015  . Generalized weakness 04/05/2015  . Acute pulmonary edema ([place]) 04/03/2015  . Nausea and vomiting 04/03/2015  . Hypoglycemia associated with diabetes ([place]) 04/03/2015  . Anemia of chronic disease 04/03/2015  . Secondary hyperparathyroidism ([place]) 04/03/2015  . Pressure ulcer 04/02/2015  . Acute respiratory failure with hypoxia ([place]) 04/01/2015  . Adjustment disorder with anxiety 03/14/2015  . Somatic symptom disorder, mild 03/08/2015  . Coronary artery disease involving native coronary artery of native heart without angina pectoris   . Nausea & vomiting 03/06/2015  . Abdominal pain 03/06/2015  .  DM (diabetes mellitus) ([place]) 03/06/2015  . HTN (hypertension) 03/06/2015  . Gastroparesis 02/24/2015  . Pleural effusion 02/19/2015  . HCAP (healthcare-associated pneumonia) 02/19/2015  . End-stage renal disease on hemodialysis ([place]) 02/19/2015    Past Surgical History  Procedure Laterality Date  . Cholecystectomy    . Appendectomy    . Abdominal hysterectomy    . Dialysis fistula creation Left     upper arm  . Esophagogastroduodenoscopy N/A 03/08/2015    Procedure:  ESOPHAGOGASTRODUODENOSCOPY (EGD);  Surgeon: Manya Silvas, MD;  Location: [facility] ENDOSCOPY;  Service: Endoscopy;  Laterality: N/A;  . Cardiac catheterization Left 07/26/2015    Procedure: Left Heart Cath and Coronary Angiography;  Surgeon: Dionisio David, MD;  Location: [place] CV LAB;  Service: Cardiovascular;  Laterality: Left;  Marland Kitchen Eye surgery    . Fecal transplant N/A 08/23/2015    Procedure: FECAL TRANSPLANT;  Surgeon: Manya Silvas, MD;  Location: [facility] ENDOSCOPY;  Service: Endoscopy;  Laterality: N/A;  . Peripheral vascular catheterization N/A 12/20/2015    Procedure: Thrombectomy of dialysis access versus permcath placement;  Surgeon: Algernon Huxley, MD;  Location: [place] [facility] LAB;  Service: Cardiovascular;  Laterality: N/A;  . Peripheral vascular catheterization N/A 12/20/2015    Procedure: A/V Shunt Intervention;  Surgeon: Algernon Huxley, MD;  Location: [place] [facility] LAB;  Service: Cardiovascular;  Laterality: N/A;  . Peripheral vascular catheterization N/A 12/20/2015    Procedure: A/V Shuntogram/Fistulagram;  Surgeon: Algernon Huxley, MD;  Location: [place] [facility];  Service: Cardiovascular;  Laterality: N/A;  . Peripheral vascular catheterization N/A 01/02/2016    Procedure: A/V Shuntogram/Fistulagram;  Surgeon: Algernon Huxley, MD;  Location: [place] [facility] LAB;  Service: Cardiovascular;  Laterality: N/A;  . Peripheral vascular catheterization N/A 01/02/2016    Procedure: A/V Shunt Intervention;  Surgeon: Algernon Huxley, MD;  Location: [place] [facility];  Service: Cardiovascular;  Laterality: N/A;  . Esophagogastroduodenoscopy (egd) with propofol N/A 03/18/2016    Procedure: ESOPHAGOGASTRODUODENOSCOPY (EGD) WITH PROPOFOL;  Surgeon: [name], MD;  Location: [facility] ENDOSCOPY;  Service: Endoscopy;  Laterality: N/A;    Current Outpatient Rx  Name  Route  Sig  Dispense  Refill  . acidophilus (RISAQUAD) CAPS capsule   Oral   Take 1 capsule by mouth daily.         Marland Kitchen albuterol  (PROVENTIL) (2.5 MG/3ML) 0.083% nebulizer solution   Nebulization   Take 2.5 mg by nebulization every 4 (four) hours as needed for wheezing or shortness of breath.         Marland Kitchen amLODipine (NORVASC) 10 MG tablet   Oral   Take 10 mg by mouth daily.         Marland Kitchen aspirin EC 81 MG tablet   Oral   Take 81 mg by mouth every morning.          Marland Kitchen atorvastatin (LIPITOR) 20 MG tablet   Oral   Take 20 mg by mouth every evening.         . cetirizine (ZYRTEC) 10 MG tablet   Oral   Take 10 mg by mouth every evening.          . cholecalciferol (VITAMIN D) 1000 units tablet   Oral   Take 1,000 Units by mouth daily.         . citalopram (CELEXA) 20 MG tablet   Oral   Take 20 mg by mouth every morning.          Marland Kitchen  clopidogrel (PLAVIX) 75 MG tablet   Oral   Take 75 mg by mouth daily. Reported on 12/17/2015         . hydrALAZINE (APRESOLINE) 25 MG tablet   Oral   Take 25 mg by mouth 3 (three) times daily.         Marland Kitchen HYDROcodone-acetaminophen (NORCO/VICODIN) 5-325 MG tablet   Oral   Take 1 tablet by mouth every 6 (six) hours as needed for moderate pain or severe pain.   20 tablet   0   . lipase/protease/amylase (CREON) 12000 UNITS CPEP capsule   Oral   Take 12,000-36,000 Units by mouth See admin instructions. Take 3 capsules (36,000 units) by mouth every morning, Take 2 capsules (24,000 units) by mouth at lunch, Take 3 capsules (36,000 units) by mouth every night at bedtime, Then 1 capsule by mouth with snack.         . multivitamin (RENA-VIT) TABS tablet   Oral   Take 1 tablet by mouth daily.         . nitroGLYCERIN (NITROSTAT) 0.4 MG SL tablet   Sublingual   Place 0.4 mg under the tongue every 5 (five) minutes x 3 doses as needed for chest pain. *Max 3 doses per episode*         . omega-3 acid ethyl esters (LOVAZA) 1 g capsule   Oral   Take 1 g by mouth daily.         . pantoprazole (PROTONIX) 40 MG tablet   Oral   Take 1 tablet (40 mg total) by mouth 2 (two)  times daily before a meal.   60 tablet   2   . promethazine (PHENERGAN) 25 MG tablet   Oral   Take 25 mg by mouth every 6 (six) hours as needed for nausea or vomiting.         . ranitidine (ZANTAC) 150 MG tablet   Oral   Take 150 mg by mouth every evening.          . ranolazine (RANEXA) 1000 MG SR tablet   Oral   Take 1,000 mg by mouth 2 (two) times daily.         . sevelamer carbonate (RENVELA) 800 MG tablet   Oral   Take 800 mg by mouth 3 (three) times daily with meals.          . vitamin B-12 (CYANOCOBALAMIN) 1000 MCG tablet   Oral   Take 1,000 mcg by mouth daily.         . B COMPLEX-C-FOLIC ACID PO   Oral   Take 1 tablet by mouth daily.         . bisacodyl (DULCOLAX) 5 MG EC tablet   Oral   Take 1 tablet (5 mg total) by mouth daily as needed for moderate constipation.   30 tablet   0   . metoCLOPramide (REGLAN) 10 MG tablet   Oral   Take 1 tablet (10 mg total) by mouth every 6 (six) hours as needed for nausea. Patient not taking: Reported on 04/07/2016   20 tablet   0   . oxymetazoline (AFRIN) 0.05 % nasal spray   Each Nare   Place 2 sprays into both nostrils 2 (two) times daily.   15 mL   0   . vancomycin (VANCOCIN) 50 mg/mL oral solution   Oral   Take 2.5 mLs (125 mg total) by mouth every 6 (six) hours. X 8 more days Patient not taking: Reported  on 04/07/2016   50 mL   0     Allergies Compazine; Ace inhibitors; Ativan; Codeine; Dilaudid; Gabapentin; Losartan; Ondansetron; Prochlorperazine; Scopolamine; Zofran; Oxycodone; and Tape  Family History  Problem Relation Age of Onset  . Kidney disease Mother   . Diabetes Mother     Social History Social History  Substance Use Topics  . Smoking status: Former Smoker -- 0.50 packs/day    Types: Cigarettes    Quit date: 02/13/2015  . Smokeless tobacco: Never Used  . Alcohol Use: No    Review of Systems  Constitutional: Negative for fever. Eyes: Negative for visual changes. ENT:  Negative for sore throat. Cardiovascular: Negative for chest pain. Respiratory: Negative for shortness of breath. Gastrointestinal:Positive for abdominal pain and vomiting Genitourinary: Negative for dysuria. Musculoskeletal: Negative for back pain. Skin: Negative for rash. Neurological: Negative for headaches, focal weakness or numbness.   10-point ROS otherwise negative.  ____________________________________________   PHYSICAL EXAM:  VITAL SIGNS: ED Triage Vitals  Enc Vitals Group     BP 04/07/16 0125 135/55 mmHg     Pulse Rate 04/07/16 0125 63     Resp 04/07/16 0125 24     Temp 04/07/16 0125 98.1 F (36.7 C)     Temp Source 04/07/16 0125 Oral     SpO2 04/07/16 0125 96 %     Weight --      Height 04/07/16 0125 5\' 5"  (1.651 m)     Head Cir --      Peak Flow --      Pain Score 04/07/16 0127 9     Pain Loc --      Pain Edu? --      Excl. in [place]? --      Constitutional: Alert and oriented. Well appearing and in no distress. Eyes: Conjunctivae are normal. PERRL. Normal extraocular movements. ENT   Head: Normocephalic and atraumatic.   Nose: No congestion/rhinnorhea.   Mouth/Throat: Mucous membranes are moist.   Neck: No stridor. Hematological/Lymphatic/Immunilogical: No cervical lymphadenopathy. Cardiovascular: Normal rate, regular rhythm. Normal and symmetric distal pulses are present in all extremities. No murmurs, rubs, or gallops. Respiratory: Normal respiratory effort without tachypnea nor retractions. Breath sounds are clear and equal bilaterally. No wheezes/rales/rhonchi. Gastrointestinal: Bilateral lower quadrant pain palpation. No distention. There is no CVA tenderness. Genitourinary: deferred Musculoskeletal: Nontender with normal range of motion in all extremities. No joint effusions.  No lower extremity tenderness nor edema. Neurologic:  Normal speech and language. No gross focal neurologic deficits are appreciated. Speech is normal.  Skin:   Skin is warm, dry and intact. No rash noted. Psychiatric: Mood and affect are normal. Speech and behavior are normal. Patient exhibits appropriate insight and judgment.  ____________________________________________    LABS (pertinent positives/negatives)  Labs Reviewed  CBC - Abnormal; Notable for the following:    RBC 3.77 (*)    Hemoglobin 9.4 (*)    HCT 28.0 (*)    MCV 74.3 (*)    MCH 25.0 (*)    RDW 22.2 (*)    All other components within normal limits  COMPREHENSIVE METABOLIC PANEL - Abnormal; Notable for the following:    Sodium 128 (*)    Potassium 5.5 (*)    Chloride 88 (*)    BUN 35 (*)    Creatinine, Ser 6.33 (*)    Calcium 8.5 (*)    Albumin 3.1 (*)    AST 55 (*)    GFR calc non Af Amer 7 (*)  GFR calc Af Amer 8 (*)    All other components within normal limits  LIPASE, BLOOD  URINALYSIS COMPLETEWITH MICROSCOPIC ([facility] ONLY)         Procedures      INITIAL IMPRESSION / ASSESSMENT AND PLAN / ED COURSE  Pertinent labs & imaging results that were available during my care of the patient were reviewed by me and considered in my medical decision making (see chart for details).  Patient has had multiple CT scans most recent of which may for similar complaint recent ultrasound performed as well which was negative as such no further imaging performed at this time patient received Dilaudid and Zofran emergency Department with improvement of nausea vomiting and pain.I offered to obtain transportation for the patient to be taken directly to dialysis however she refused stating that she would like to go home first and change her close. I informed the patient of the danger of missing dialysis today however she is adamant that she will go after her son comes to pick her up.  ____________________________________________   FINAL CLINICAL IMPRESSION(S) / ED DIAGNOSES  Final diagnoses:  Hyperkalemia  Chronic abdominal pain  Intractable cyclical vomiting with nausea       Gregor Hams, MD 04/07/16 [phone]

## 2016-04-07 NOTE — ED Notes (Signed)
Patient is back from dialysis.

## 2016-04-07 NOTE — ED Notes (Signed)
Patient had large of amount of green-black soft stool. Patient cleaned up and reposition on stretcher.

## 2016-04-07 NOTE — ED Notes (Signed)
Pt is jerking intermittently, states that she doesn't know why she is jerking, states that none of her dr's have ever been able to tell her the reason, pt states that her lower back hurts and her lower abd hurts, as well as her legs and feet, pt states that she vomited tonight and was concerned that it looked black. Pt states a few weeks ago she was diagnosed with c.diff, pt states that her bm yesterday was soft

## 2016-04-08 ENCOUNTER — Inpatient Hospital Stay
Admission: EM | Admit: 2016-04-08 | Discharge: 2016-04-11 | DRG: 441 | Disposition: A | Discharge status: Home Health | Payer: Medicare Other | Attending: Internal Medicine | Admitting: Internal Medicine

## 2016-04-08 ENCOUNTER — Encounter: Payer: Self-pay | Admitting: Emergency Medicine

## 2016-04-08 ENCOUNTER — Emergency Department: Payer: Medicare Other

## 2016-04-08 DIAGNOSIS — K7290 Hepatic failure, unspecified without coma: Principal | ICD-10-CM | POA: Diagnosis present

## 2016-04-08 DIAGNOSIS — Z794 Long term (current) use of insulin: Secondary | ICD-10-CM | POA: Diagnosis not present

## 2016-04-08 DIAGNOSIS — N2581 Secondary hyperparathyroidism of renal origin: Secondary | ICD-10-CM | POA: Diagnosis present

## 2016-04-08 DIAGNOSIS — N186 End stage renal disease: Secondary | ICD-10-CM | POA: Diagnosis present

## 2016-04-08 DIAGNOSIS — Z992 Dependence on renal dialysis: Secondary | ICD-10-CM

## 2016-04-08 DIAGNOSIS — R4182 Altered mental status, unspecified: Secondary | ICD-10-CM

## 2016-04-08 DIAGNOSIS — I252 Old myocardial infarction: Secondary | ICD-10-CM | POA: Diagnosis not present

## 2016-04-08 DIAGNOSIS — G92 Toxic encephalopathy: Secondary | ICD-10-CM | POA: Diagnosis present

## 2016-04-08 DIAGNOSIS — Z87891 Personal history of nicotine dependence: Secondary | ICD-10-CM | POA: Diagnosis not present

## 2016-04-08 DIAGNOSIS — I132 Hypertensive heart and chronic kidney disease with heart failure and with stage 5 chronic kidney disease, or end stage renal disease: Secondary | ICD-10-CM | POA: Diagnosis present

## 2016-04-08 DIAGNOSIS — I2510 Atherosclerotic heart disease of native coronary artery without angina pectoris: Secondary | ICD-10-CM | POA: Diagnosis present

## 2016-04-08 DIAGNOSIS — Z7982 Long term (current) use of aspirin: Secondary | ICD-10-CM

## 2016-04-08 DIAGNOSIS — Z833 Family history of diabetes mellitus: Secondary | ICD-10-CM

## 2016-04-08 DIAGNOSIS — T50905A Adverse effect of unspecified drugs, medicaments and biological substances, initial encounter: Secondary | ICD-10-CM | POA: Diagnosis present

## 2016-04-08 DIAGNOSIS — Z79899 Other long term (current) drug therapy: Secondary | ICD-10-CM

## 2016-04-08 DIAGNOSIS — I5032 Chronic diastolic (congestive) heart failure: Secondary | ICD-10-CM | POA: Diagnosis present

## 2016-04-08 DIAGNOSIS — E7220 Disorder of urea cycle metabolism, unspecified: Secondary | ICD-10-CM | POA: Diagnosis present

## 2016-04-08 DIAGNOSIS — F329 Major depressive disorder, single episode, unspecified: Secondary | ICD-10-CM

## 2016-04-08 DIAGNOSIS — F333 Major depressive disorder, recurrent, severe with psychotic symptoms: Secondary | ICD-10-CM | POA: Diagnosis present

## 2016-04-08 DIAGNOSIS — F32A Depression, unspecified: Secondary | ICD-10-CM

## 2016-04-08 DIAGNOSIS — Z955 Presence of coronary angioplasty implant and graft: Secondary | ICD-10-CM | POA: Diagnosis not present

## 2016-04-08 DIAGNOSIS — E1122 Type 2 diabetes mellitus with diabetic chronic kidney disease: Secondary | ICD-10-CM | POA: Diagnosis present

## 2016-04-08 DIAGNOSIS — Z7902 Long term (current) use of antithrombotics/antiplatelets: Secondary | ICD-10-CM

## 2016-04-08 DIAGNOSIS — Y929 Unspecified place or not applicable: Secondary | ICD-10-CM | POA: Diagnosis not present

## 2016-04-08 DIAGNOSIS — Z841 Family history of disorders of kidney and ureter: Secondary | ICD-10-CM | POA: Diagnosis not present

## 2016-04-08 DIAGNOSIS — R778 Other specified abnormalities of plasma proteins: Secondary | ICD-10-CM

## 2016-04-08 DIAGNOSIS — R7989 Other specified abnormal findings of blood chemistry: Secondary | ICD-10-CM

## 2016-04-08 DIAGNOSIS — D638 Anemia in other chronic diseases classified elsewhere: Secondary | ICD-10-CM | POA: Diagnosis present

## 2016-04-08 LAB — COMPREHENSIVE METABOLIC PANEL
ALT: 16 U/L (ref 14–54)
AST: 27 U/L (ref 15–41)
Albumin: 3.1 g/dL — ABNORMAL LOW (ref 3.5–5.0)
Alkaline Phosphatase: 100 U/L (ref 38–126)
Anion gap: 10 (ref 5–15)
BUN: 18 mg/dL (ref 6–20)
CO2: 27 mmol/L (ref 22–32)
Calcium: 8.3 mg/dL — ABNORMAL LOW (ref 8.9–10.3)
Chloride: 97 mmol/L — ABNORMAL LOW (ref 101–111)
Creatinine, Ser: 4.16 mg/dL — ABNORMAL HIGH (ref 0.44–1.00)
GFR calc Af Amer: 13 mL/min — ABNORMAL LOW (ref >60)
GFR calc non Af Amer: 11 mL/min — ABNORMAL LOW (ref >60)
Glucose, Bld: 83 mg/dL (ref 65–99)
Potassium: 3.8 mmol/L (ref 3.5–5.1)
Sodium: 134 mmol/L — ABNORMAL LOW (ref 135–145)
Total Bilirubin: 0.5 mg/dL (ref 0.3–1.2)
Total Protein: 8 g/dL (ref 6.5–8.1)

## 2016-04-08 LAB — MAGNESIUM: Magnesium: 2.4 mg/dL (ref 1.7–2.4)

## 2016-04-08 LAB — GLUCOSE, CAPILLARY
Glucose-Capillary: 219 mg/dL — ABNORMAL HIGH (ref 65–99)
Glucose-Capillary: 193 mg/dL — ABNORMAL HIGH (ref 65–99)
Glucose-Capillary: 103 mg/dL — ABNORMAL HIGH (ref 65–99)

## 2016-04-08 LAB — HEPATIC FUNCTION PANEL
ALT: 15 U/L (ref 14–54)
AST: 36 U/L (ref 15–41)
Albumin: 2.9 g/dL — ABNORMAL LOW (ref 3.5–5.0)
Alkaline Phosphatase: 93 U/L (ref 38–126)
Bilirubin, Direct: 0.2 mg/dL (ref 0.1–0.5)
Indirect Bilirubin: 0.3 mg/dL (ref 0.3–0.9)
Total Bilirubin: 0.5 mg/dL (ref 0.3–1.2)
Total Protein: 7.7 g/dL (ref 6.5–8.1)

## 2016-04-08 LAB — CBC
HCT: 29.6 % — ABNORMAL LOW (ref 35.0–47.0)
Hemoglobin: 9.5 g/dL — ABNORMAL LOW (ref 12.0–16.0)
MCH: 24.1 pg — ABNORMAL LOW (ref 26.0–34.0)
MCHC: 32.1 g/dL (ref 32.0–36.0)
MCV: 75 fL — ABNORMAL LOW (ref 80.0–100.0)
Platelets: 205 10*3/uL (ref 150–440)
RBC: 3.94 MIL/uL (ref 3.80–5.20)
RDW: 22.3 % — ABNORMAL HIGH (ref 11.5–14.5)
WBC: 6.7 10*3/uL (ref 3.6–11.0)

## 2016-04-08 LAB — TROPONIN I
Troponin I: 0.04 ng/mL (ref <0.03)
Troponin I: <0.03 ng/mL (ref <0.03)

## 2016-04-08 LAB — AMMONIA: Ammonia: 46 umol/L — ABNORMAL HIGH (ref 9–35)

## 2016-04-08 MED ORDER — HALOPERIDOL LACTATE 5 MG/ML IJ SOLN
5.0000 mg | Freq: Once | INTRAMUSCULAR | Status: AC
  Administered 2016-04-08: 5 mg via INTRAMUSCULAR
  Filled 2016-04-08: qty 1

## 2016-04-08 MED ORDER — LORATADINE 10 MG PO TABS
10.0000 mg | ORAL_TABLET | Freq: Every day | ORAL | Status: DC
  Administered 2016-04-09 – 2016-04-11 (×3): 10 mg via ORAL
  Filled 2016-04-08 (×3): qty 1

## 2016-04-08 MED ORDER — RENA-VITE PO TABS
1.0000 | ORAL_TABLET | Freq: Every day | ORAL | Status: DC
  Administered 2016-04-09 – 2016-04-11 (×3): 1 via ORAL
  Filled 2016-04-08 (×3): qty 1

## 2016-04-08 MED ORDER — ALBUTEROL SULFATE HFA 108 (90 BASE) MCG/ACT IN AERS: 2.0000 | INHALATION_SPRAY | RESPIRATORY_TRACT | Status: DC | PRN

## 2016-04-08 MED ORDER — CITALOPRAM HYDROBROMIDE 20 MG PO TABS: 20.0000 mg | ORAL_TABLET | Freq: Every day | ORAL | Status: DC

## 2016-04-08 MED ORDER — PANTOPRAZOLE SODIUM 40 MG PO TBEC
40.0000 mg | DELAYED_RELEASE_TABLET | Freq: Two times a day (BID) | ORAL | Status: DC
  Administered 2016-04-08 – 2016-04-11 (×6): 40 mg via ORAL
  Filled 2016-04-08 (×7): qty 1

## 2016-04-08 MED ORDER — SODIUM CHLORIDE 0.9% FLUSH
3.0000 mL | Freq: Two times a day (BID) | INTRAVENOUS | Status: DC
  Administered 2016-04-10: 3 mL via INTRAVENOUS

## 2016-04-08 MED ORDER — HYDROCODONE-ACETAMINOPHEN 5-325 MG PO TABS
1.0000 | ORAL_TABLET | Freq: Two times a day (BID) | ORAL | Status: DC | PRN
  Administered 2016-04-08 – 2016-04-09 (×2): 1 via ORAL
  Filled 2016-04-08 (×3): qty 1

## 2016-04-08 MED ORDER — HEPARIN SODIUM (PORCINE) 5000 UNIT/ML IJ SOLN
5000.0000 [IU] | Freq: Three times a day (TID) | INTRAMUSCULAR | Status: DC
  Administered 2016-04-10 (×3): 5000 [IU] via SUBCUTANEOUS
  Filled 2016-04-08 (×4): qty 1

## 2016-04-08 MED ORDER — HYDRALAZINE HCL 25 MG PO TABS
25.0000 mg | ORAL_TABLET | Freq: Three times a day (TID) | ORAL | Status: DC
  Administered 2016-04-08 – 2016-04-11 (×7): 25 mg via ORAL
  Filled 2016-04-08 (×8): qty 1

## 2016-04-08 MED ORDER — RISAQUAD PO CAPS
1.0000 | ORAL_CAPSULE | Freq: Every day | ORAL | Status: DC
  Administered 2016-04-09 – 2016-04-11 (×3): 1 via ORAL
  Filled 2016-04-08 (×3): qty 1

## 2016-04-08 MED ORDER — INSULIN ASPART 100 UNIT/ML ~~LOC~~ SOLN
0.0000 [IU] | Freq: Three times a day (TID) | SUBCUTANEOUS | Status: DC
  Filled 2016-04-08: qty 2

## 2016-04-08 MED ORDER — METOCLOPRAMIDE HCL 10 MG PO TABS
5.0000 mg | ORAL_TABLET | Freq: Four times a day (QID) | ORAL | Status: DC
  Filled 2016-04-08 (×2): qty 1

## 2016-04-08 MED ORDER — RANOLAZINE ER 500 MG PO TB12
1000.0000 mg | ORAL_TABLET | Freq: Two times a day (BID) | ORAL | Status: DC
  Administered 2016-04-08 – 2016-04-11 (×5): 1000 mg via ORAL
  Filled 2016-04-08 (×5): qty 2

## 2016-04-08 MED ORDER — SODIUM CHLORIDE 0.9% FLUSH: 3.0000 mL | INTRAVENOUS | Status: DC | PRN

## 2016-04-08 MED ORDER — OMEGA-3-ACID ETHYL ESTERS 1 G PO CAPS
1.0000 g | ORAL_CAPSULE | Freq: Every day | ORAL | Status: DC
  Administered 2016-04-09 – 2016-04-11 (×3): 1 g via ORAL
  Filled 2016-04-08 (×3): qty 1

## 2016-04-08 MED ORDER — PANCRELIPASE (LIP-PROT-AMYL) 12000-38000 UNITS PO CPEP
36000.0000 [IU] | ORAL_CAPSULE | Freq: Two times a day (BID) | ORAL | Status: DC
  Administered 2016-04-08 – 2016-04-11 (×6): 36000 [IU] via ORAL
  Filled 2016-04-08 (×7): qty 3

## 2016-04-08 MED ORDER — LACTULOSE 10 GM/15ML PO SOLN: 30.0000 g | Freq: Two times a day (BID) | ORAL | Status: DC | PRN

## 2016-04-08 MED ORDER — CLOPIDOGREL BISULFATE 75 MG PO TABS
75.0000 mg | ORAL_TABLET | Freq: Every day | ORAL | Status: DC
  Administered 2016-04-09 – 2016-04-11 (×3): 75 mg via ORAL
  Filled 2016-04-08 (×3): qty 1

## 2016-04-08 MED ORDER — HALOPERIDOL LACTATE 5 MG/ML IJ SOLN: 1.0000 mg | INTRAMUSCULAR | Status: DC | PRN

## 2016-04-08 MED ORDER — RIFAXIMIN 550 MG PO TABS
550.0000 mg | ORAL_TABLET | Freq: Two times a day (BID) | ORAL | Status: DC
  Administered 2016-04-08 – 2016-04-11 (×5): 550 mg via ORAL
  Filled 2016-04-08 (×5): qty 1

## 2016-04-08 MED ORDER — ATORVASTATIN CALCIUM 20 MG PO TABS
20.0000 mg | ORAL_TABLET | Freq: Every evening | ORAL | Status: DC
  Administered 2016-04-08 – 2016-04-10 (×3): 20 mg via ORAL
  Filled 2016-04-08 (×3): qty 1

## 2016-04-08 MED ORDER — PREGABALIN 25 MG PO CAPS
25.0000 mg | ORAL_CAPSULE | Freq: Every day | ORAL | Status: DC
  Administered 2016-04-08 – 2016-04-11 (×4): 25 mg via ORAL
  Filled 2016-04-08 (×4): qty 1

## 2016-04-08 MED ORDER — INSULIN ASPART 100 UNIT/ML ~~LOC~~ SOLN
3.0000 [IU] | Freq: Three times a day (TID) | SUBCUTANEOUS | Status: DC
  Filled 2016-04-08: qty 3

## 2016-04-08 MED ORDER — PROMETHAZINE HCL 25 MG PO TABS
12.5000 mg | ORAL_TABLET | Freq: Three times a day (TID) | ORAL | Status: DC | PRN
  Filled 2016-04-08: qty 1

## 2016-04-08 MED ORDER — PANCRELIPASE (LIP-PROT-AMYL) 12000-38000 UNITS PO CPEP
24000.0000 [IU] | ORAL_CAPSULE | Freq: Every day | ORAL | Status: DC
  Administered 2016-04-10 – 2016-04-11 (×2): 24000 [IU] via ORAL
  Filled 2016-04-08 (×2): qty 2

## 2016-04-08 MED ORDER — SODIUM CHLORIDE 0.9 % IV SOLN: 250.0000 mL | INTRAVENOUS | Status: DC | PRN

## 2016-04-08 MED ORDER — SEVELAMER CARBONATE 800 MG PO TABS
800.0000 mg | ORAL_TABLET | Freq: Three times a day (TID) | ORAL | Status: DC
  Administered 2016-04-08 – 2016-04-11 (×9): 800 mg via ORAL
  Filled 2016-04-08 (×11): qty 1

## 2016-04-08 MED ORDER — ALBUTEROL SULFATE (2.5 MG/3ML) 0.083% IN NEBU: 2.5000 mg | INHALATION_SOLUTION | RESPIRATORY_TRACT | Status: DC | PRN

## 2016-04-08 MED ORDER — SODIUM CHLORIDE 0.9% FLUSH
3.0000 mL | Freq: Two times a day (BID) | INTRAVENOUS | Status: DC
  Administered 2016-04-08 – 2016-04-10 (×5): 3 mL via INTRAVENOUS

## 2016-04-08 MED ORDER — VITAMIN B-12 1000 MCG PO TABS
1000.0000 ug | ORAL_TABLET | Freq: Every day | ORAL | Status: DC
  Administered 2016-04-09 – 2016-04-11 (×3): 1000 ug via ORAL
  Filled 2016-04-08 (×3): qty 1

## 2016-04-08 MED ORDER — AMLODIPINE BESYLATE 10 MG PO TABS
10.0000 mg | ORAL_TABLET | Freq: Every day | ORAL | Status: DC
  Administered 2016-04-09 – 2016-04-11 (×3): 10 mg via ORAL
  Filled 2016-04-08 (×3): qty 1

## 2016-04-08 MED ORDER — FLUOXETINE HCL 20 MG PO CAPS
20.0000 mg | ORAL_CAPSULE | Freq: Every day | ORAL | Status: DC
  Administered 2016-04-08 – 2016-04-11 (×4): 20 mg via ORAL
  Filled 2016-04-08 (×4): qty 1

## 2016-04-08 MED ORDER — OLANZAPINE 2.5 MG PO TABS
2.5000 mg | ORAL_TABLET | Freq: Every day | ORAL | Status: DC
  Administered 2016-04-08 – 2016-04-10 (×3): 2.5 mg via ORAL
  Filled 2016-04-08 (×3): qty 1

## 2016-04-08 MED ORDER — FAMOTIDINE 20 MG PO TABS
20.0000 mg | ORAL_TABLET | Freq: Every day | ORAL | Status: DC
  Administered 2016-04-09 – 2016-04-11 (×3): 20 mg via ORAL
  Filled 2016-04-08 (×3): qty 1

## 2016-04-08 MED ORDER — ASPIRIN EC 81 MG PO TBEC
81.0000 mg | DELAYED_RELEASE_TABLET | ORAL | Status: DC
  Administered 2016-04-09 – 2016-04-11 (×3): 81 mg via ORAL
  Filled 2016-04-08 (×3): qty 1

## 2016-04-08 NOTE — ED Provider Notes (Signed)
[facility] Emergency Department Provider Note  ____________________________________________  Time seen: Approximately 10:10 AM  I have reviewed the triage vital signs and the nursing notes.  History is limited by the patient's altered mental status. The history is obtained from the patient's daughter and the patient as much as possible.  HISTORY  Chief Complaint Altered Mental Status    HPI NORMAL KUZMICH is a 55 y.o. female with a history of ESRD on HD, CAD s/p stent, HTN, DM, CHF, depression and anxiety, presenting for altered mental status. The patient is brought in by her daughter today for confusion, and aggressive behavior. The patient was seen twice in the emergency department yesterday. She was discharged in the morning after reassuring evaluation for abdominal pain and vomiting, went to dialysis, and then returned with altered mental status in the setting of hypoglycemia. Once her blood sugar normalized, she was discharged home. This morning, the patient was found to be confusing her family members, shouting for her mother.  There is no history suggestive of falls, no overnight episodes including nausea, vomiting, shortness of breath, chest pain, fever. The patient denies any pain or discomfort. She does repetitively asked for her mother, and states "they took my children away." The patient's daughter states that she has had episodes like this before, but is unable to give me more insight into what the diagnosis of these have been in the past and the daughter denies any psychiatric illness more than depression.   Past Medical History  Diagnosis Date  . Asthma   . Collagen vascular disease ([place])   . Diabetes mellitus without complication ([place])   . Hypertension   . Coronary artery disease     a. cath 2013: stenting to RCA (report not available); b. cath 2014: LM nl, pLAD 40%, mLAD nl, ost LCx 40%, mid LCx nl, pRCA 30% @ site of prior stent, mRCA 50%  .  Diabetic neuropathy ([place])   . ESRD (end stage renal disease) on dialysis ([place])     M-W-F  . GERD (gastroesophageal reflux disease)   . Hx of pancreatitis 2015  . Myocardial infarction ([place])   . Pneumonia   . Mitral regurgitation     a. echo 10/2013: EF 62%, noWMA, mildly dilated LA, mild to mod MR/TR, GR1DD  . COPD (chronic obstructive pulmonary disease) ([place])   . chronic diastolic CHF [id number]  . dialysis 2006  . Depression   . Anxiety   . Arthritis   . Headache   . Anemia   . Renal insufficiency     Patient Active Problem List   Diagnosis Date Noted  . Blood in stool   . Intractable cyclical vomiting with nausea   . Reflux esophagitis   . Gastritis   . Generalized abdominal pain   . Uncontrollable vomiting   . Major depressive disorder, recurrent episode, moderate ([place]) 03/15/2016  . Adjustment disorder with mixed anxiety and depressed mood 03/15/2016  . Malnutrition of moderate degree 12/01/2015  . Renal mass   . Dyspnea   . Acute renal failure ([place])   . Respiratory failure ([place])   . High temperature 11/14/2015  . Pulmonary edema   . Encounter for central line placement   . Encounter for orogastric (OG) tube placement   . Nausea 11/12/2015  . Hyperkalemia 10/03/2015  . C. difficile diarrhea 07/22/2015  . Pneumonia 05/21/2015  . Hypoglycemia 04/24/2015  . Unresponsiveness 04/24/2015  . Bradycardia 04/24/2015  . Hypothermia 04/24/2015  . Acute respiratory failure (  [place]) 04/24/2015  . Acute diastolic CHF (congestive heart failure) ([place]) 04/05/2015  . Diabetic gastroparesis ([place]) 04/05/2015  . Hypokalemia 04/05/2015  . Generalized weakness 04/05/2015  . Acute pulmonary edema ([place]) 04/03/2015  . Nausea and vomiting 04/03/2015  . Hypoglycemia associated with diabetes ([place]) 04/03/2015  . Anemia of chronic disease 04/03/2015  . Secondary hyperparathyroidism ([place]) 04/03/2015  . Pressure ulcer 04/02/2015  . Acute respiratory failure with hypoxia ([place]) 04/01/2015  .  Adjustment disorder with anxiety 03/14/2015  . Somatic symptom disorder, mild 03/08/2015  . Coronary artery disease involving native coronary artery of native heart without angina pectoris   . Nausea & vomiting 03/06/2015  . Abdominal pain 03/06/2015  . DM (diabetes mellitus) ([place]) 03/06/2015  . HTN (hypertension) 03/06/2015  . Gastroparesis 02/24/2015  . Pleural effusion 02/19/2015  . HCAP (healthcare-associated pneumonia) 02/19/2015  . End-stage renal disease on hemodialysis ([place]) 02/19/2015    Past Surgical History  Procedure Laterality Date  . Cholecystectomy    . Appendectomy    . Abdominal hysterectomy    . Dialysis fistula creation Left     upper arm  . Esophagogastroduodenoscopy N/A 03/08/2015    Procedure: ESOPHAGOGASTRODUODENOSCOPY (EGD);  Surgeon: Manya Silvas, MD;  Location[facility] END[facility]COPY;  Service: Endoscopy;  Laterality: N/A;  . Cardiac catheterization Left 07/26/2015    Procedure: Left Heart Cath and Coronary Angiography;  Surgeon: Dionisio David, MD;  Location: [place] CV LAB;  Service: Cardiovascular;  Laterality: Left;  Marland Kitchen Eye surgery    . Fecal transplant N/A 08/23/2015    Procedure: FECAL TRANSPLANT;  Surgeon: Manya Silvas, MD;  Location[facility] ENDOSCOPY;  Service: Endoscopy;  Laterality: N/A;  . Peripheral vascular catheterization N/A 12/20/2015    Procedure: Thrombectomy of dialysis access versus permcath placement;  Surgeon: Algernon Huxley, MD;  Location: [place] CV LAB;  Service: Cardiovascular;  Laterality: N/A;  . Peripheral vascular catheterization N/A 12/20/2015    Procedure: A/V Shunt Intervention;  Surgeon: Algernon Huxley, MD;  Location: [place] CV LAB;  Service: Cardiovascular;  Laterality: N/A;  . Peripheral vascular catheterization N/A 12/20/2015    Procedure: A/V Shuntogram/Fistulagram;  Surgeon: Algernon Huxley, MD;  Location: [place] CV LAB;  Service: Cardiovascular;  Laterality: N/A;  . Peripheral vascular catheterization N/A  01/02/2016    Procedure: A/V Shuntogram/Fistulagram;  Surgeon: Algernon Huxley, MD;  Location: [place] CV LAB;  Service: Cardiovascular;  Laterality: N/A;  . Peripheral vascular catheterization N/A 01/02/2016    Procedure: A/V Shunt Intervention;  Surgeon: Algernon Huxley, MD;  Location: [place] CV LAB;  Service: Cardiovascular;  Laterality: N/A;  . Esophagogastroduodenoscopy (egd) with propofol N/A 03/18/2016    Procedure: ESOPHAGOGASTRODUODENOSCOPY (EGD) WITH PROPOFOL;  Surgeon: [name], MD;  Location: [facility] ENDOSCOPY;  Service: Endoscopy;  Laterality: N/A;    Current Outpatient Rx  Name  Route  Sig  Dispense  Refill  . acidophilus (RISAQUAD) CAPS capsule   Oral   Take 1 capsule by mouth daily.         Marland Kitchen albuterol (PROVENTIL HFA;VENTOLIN HFA) 108 (90 Base) MCG/ACT inhaler   Inhalation   Inhale 2 puffs into the lungs every 4 (four) hours as needed for wheezing or shortness of breath.         Marland Kitchen albuterol (PROVENTIL) (2.5 MG/3ML) 0.083% nebulizer solution   Nebulization   Take 2.5 mg by nebulization every 4 (four) hours as needed for wheezing or shortness of breath.         Marland Kitchen  amLODipine (NORVASC) 10 MG tablet   Oral   Take 10 mg by mouth daily.         Marland Kitchen aspirin EC 81 MG tablet   Oral   Take 81 mg by mouth every morning.          Marland Kitchen atorvastatin (LIPITOR) 20 MG tablet   Oral   Take 20 mg by mouth every evening.         . cetirizine (ZYRTEC) 10 MG tablet   Oral   Take 10 mg by mouth daily.          . citalopram (CELEXA) 20 MG tablet   Oral   Take 20 mg by mouth daily.          . clopidogrel (PLAVIX) 75 MG tablet   Oral   Take 75 mg by mouth daily. Reported on 12/17/2015         . glucose 4 GM chewable tablet   Oral   Chew 3-4 tablets by mouth as needed for low blood sugar.         . hydrALAZINE (APRESOLINE) 25 MG tablet   Oral   Take 25 mg by mouth 3 (three) times daily.         Marland Kitchen HYDROcodone-acetaminophen (NORCO/VICODIN) 5-325 MG tablet    Oral   Take 1 tablet by mouth every 6 (six) hours as needed for moderate pain or severe pain. Patient taking differently: Take 1 tablet by mouth 2 (two) times daily as needed for moderate pain or severe pain.    20 tablet   0   . lipase/protease/amylase (CREON) 12000 UNITS CPEP capsule   Oral   Take 12,000-36,000 Units by mouth See admin instructions. 36,000 units daily with breakfast; 24,000 units daily with lunch, and 36,000 units daily with dinner         . metoCLOPramide (REGLAN) 5 MG tablet   Oral   Take 5 mg by mouth 4 (four) times daily.         . multivitamin (RENA-VIT) TABS tablet   Oral   Take 1 tablet by mouth daily.         Marland Kitchen omega-3 acid ethyl esters (LOVAZA) 1 g capsule   Oral   Take 1 g by mouth daily.         . pantoprazole (PROTONIX) 40 MG tablet   Oral   Take 1 tablet (40 mg total) by mouth 2 (two) times daily before a meal.   60 tablet   2   . pregabalin (LYRICA) 25 MG capsule   Oral   Take 25 mg by mouth daily.         . promethazine (PHENERGAN) 12.5 MG tablet   Oral   Take 12.5 mg by mouth every 8 (eight) hours as needed for nausea.         . ranitidine (ZANTAC) 150 MG tablet   Oral   Take 150 mg by mouth daily.          . ranolazine (RANEXA) 1000 MG SR tablet   Oral   Take 1,000 mg by mouth 2 (two) times daily.         . sevelamer carbonate (RENVELA) 800 MG tablet   Oral   Take 800 mg by mouth 3 (three) times daily with meals.          . vitamin B-12 (CYANOCOBALAMIN) 1000 MCG tablet   Oral   Take 1,000 mcg by mouth daily.  Allergies Compazine; Ace inhibitors; Ativan; Codeine; Dilaudid; Gabapentin; Losartan; Ondansetron; Prochlorperazine; Scopolamine; Zofran; Oxycodone; and Tape  Family History  Problem Relation Age of Onset  . Kidney disease Mother   . Diabetes Mother     Social History Social History  Substance Use Topics  . Smoking status: Former Smoker -- 0.50 packs/day    Types: Cigarettes     Quit date: 02/13/2015  . Smokeless tobacco: Never Used  . Alcohol Use: No    Review of Systems Constitutional: No fever/chills.No known lightheadedness or syncope. Positive altered mental status. No known trauma. No known changes in medications. Eyes: No visual changes. ENT: No sore throat. No congestion or rhinorrhea. Cardiovascular: Denies chest pain. Denies palpitations. Respiratory: Denies shortness of breath.  No cough. Gastrointestinal: No abdominal pain.  No nausea, no vomiting.  No diarrhea.  No constipation. Genitourinary: No longer urinates. Musculoskeletal: Negative for back pain. Skin: Negative for rash. Neurological: Negative for headaches. No focal numbness, tingling or weakness.  Psychiatric:History of anxiety and depression.  10-point ROS otherwise negative.  ____________________________________________   PHYSICAL EXAM:  VITAL SIGNS: ED Triage Vitals  Enc Vitals Group     BP 04/08/16 0809 155/57 mmHg     Pulse Rate 04/08/16 0809 73     Resp 04/08/16 0809 18     Temp 04/08/16 0809 98.1 F (36.7 C)     Temp Source 04/08/16 0809 Axillary     SpO2 04/08/16 0809 94 %     Weight --      Height --      Head Cir --      Peak Flow --      Pain Score --      Pain Loc --      Pain Edu? --      Excl. in [place]? --     Constitutional: The patient is alert to person only. She is able to answer some questions but appears to be paranoid and delusional. She is nontoxic in appearance and in no acute distress.  Eyes: Conjunctivae are normal.  EOMI. No scleral icterus. Head: Atraumatic. Nose: No congestion/rhinnorhea. Mouth/Throat: Mucous membranes are moist.  Neck: No stridor.  Supple.  Positive JVD. No meningismus. Cardiovascular: Normal rate, regular rhythm. No murmurs, rubs or gallops.  Respiratory: Normal respiratory effort.  No accessory muscle use or retractions. Lungs CTAB.  No wheezes, rales or ronchi. Gastrointestinal: Soft, nontender and nondistended.  No  guarding or rebound.  No peritoneal signs. Musculoskeletal: No LE edema. No ttp in the calves or palpable cords.  Negative Homan's sign. Left upper extremity has AV fistula that has a good thrill. Neurologic: Alert to person only. Protecting airway. Speech is clear.  Face and smile are symmetric.  EOMI.  Moves all extremities well. Skin:  Skin is warm, dry and intact. No rash noted. Psychiatric: Abnormal mood and behavior. Intermittently appears paranoid.  ____________________________________________   LABS (all labs ordered are listed, but only abnormal results are displayed)  Labs Reviewed  CBC - Abnormal; Notable for the following:    Hemoglobin 9.5 (*)    HCT 29.6 (*)    MCV 75.0 (*)    MCH 24.1 (*)    RDW 22.3 (*)    All other components within normal limits  TROPONIN I - Abnormal; Notable for the following:    Troponin I 0.04 (*)    All other components within normal limits  COMPREHENSIVE METABOLIC PANEL - Abnormal; Notable for the following:    Sodium 134 (*)  Chloride 97 (*)    Creatinine, Ser 4.16 (*)    Calcium 8.3 (*)    Albumin 3.1 (*)    GFR calc non Af Amer 11 (*)    GFR calc Af Amer 13 (*)    All other components within normal limits  AMMONIA - Abnormal; Notable for the following:    Ammonia 46 (*)    All other components within normal limits  CULTURE, BLOOD (ROUTINE X 2)  CULTURE, BLOOD (ROUTINE X 2)  CULTURE, BLOOD (ROUTINE X 2)   ____________________________________________  EKG  ED ECG REPORT I, Eula Listen, the attending physician, personally viewed and interpreted this ECG.   Date: 04/08/2016  EKG Time: 928  Rate: 78  Rhythm: normal sinus rhythm  Axis: Normal  Intervals:none  ST&T Change: No ST elevation.  ____________________________________________  RADIOLOGY  Ct Head Wo Contrast  04/08/2016  CLINICAL DATA:  Altered mental status. EXAM: CT HEAD WITHOUT CONTRAST TECHNIQUE: Contiguous axial images were obtained from the base  of the skull through the vertex without intravenous contrast. COMPARISON:  CT scan of April 24, 2015. FINDINGS: Bony calvarium appears intact. No mass effect or midline shift is noted. Ventricular size is within normal limits. There is no evidence of mass lesion, hemorrhage or acute infarction. IMPRESSION: Normal head CT. Electronically Signed   By: Marijo Conception, M.D.   On: 04/08/2016 09:05   Dg Chest Portable 1 View  04/07/2016  CLINICAL DATA:  Altered mental status post dialysis today EXAM: PORTABLE CHEST 1 VIEW COMPARISON:  03/27/2016 FINDINGS: Cardiomegaly is noted. Mild interstitial prominence bilateral without convincing pulmonary edema. Bilateral small pleural effusion again noted. Worsening atelectasis or infiltrate in right middle lobe. IMPRESSION: Bilateral small pleural effusion again noted. No convincing pulmonary edema. Worsening atelectasis or infiltrate in right middle lobe. Electronically Signed   By: Lahoma Crocker M.D.   On: 04/07/2016 14:22    ____________________________________________   PROCEDURES  Procedure(s) performed: None  Critical Care performed: No ____________________________________________   INITIAL IMPRESSION / ASSESSMENT AND PLAN / ED COURSE  Pertinent labs & imaging results that were available during my care of the patient were reviewed by me and considered in my medical decision making (see chart for details).  54 y.o. female with a history of ESRD on HD, CAD brought by family for altered mental status after 2 ED visits yesterday for abdominal pain, vomiting, and hypoglycemia. On arrival to the emergency room, the patient has stable vital signs and is protecting her airway. She has no focal neurologic deficits on my exam, but we will get a CT scan to evaluate for any intracranial abnormality. We will also consider uremia. She has no known liver disease but will get LFTs and ammonia. There are no signs or symptoms suggestive of infection, although she is at  higher risk given her likely immunocompromised baseline state as well as being a dialysis patient. There is a possible psychiatric component to her symptoms as well. Anticipate admission.  ----------------------------------------- 10:33 AM on 04/08/2016 -----------------------------------------  I placed an ultrasound guided IV in to the patient's left forearm. She is a 22-gauge and an area that was prepped with chlorhexidine prior to insertion and visualized under ultrasound guidance. The patient tolerated the procedure well.  I've spoken with the patient's primary care physician, Dr. Ellin Goodie, who can be reached on her cell phone at [phone], who states that this patient has had similar episodes in the past when her SSRI has been withheld.  At this time,  the patient should be giving her medications but it is unclear if that is certain or not. Dr. Quay Burow is willing to answer more questions with the patient's previous psychiatric history as well as her medical history.  At this time, the patient appears more calm and is somnolent but arousable to verbal stimulus. The Haldol seems to have taken effect and this has helped [place] to be able to continue her medical examination.  ----------------------------------------- 12:04 PM on 04/08/2016 -----------------------------------------  The patient's workup in the emergency department shows a minimally elevated troponin without any ischemic changes on the patient's EKG. Her symptoms today are unlikely due to ACS or MI, but this will be trended by the admitting doctors. She has some mild hyperammonemia, but is not exhibiting significant encephalopathy, so again this is less likely to be the cause of her altered mental status. I'll plan to admit the patient to the hospital for further evaluation and treatment at this time.  ____________________________________________  FINAL CLINICAL IMPRESSION(S) / ED DIAGNOSES  Final diagnoses:  Elevated  troponin  Altered mental status, unspecified altered mental status type  Hyperammonemia ([place])      NEW MEDICATIONS STARTED DURING THIS VISIT:  New Prescriptions   No medications on file     Eula Listen, MD 04/08/16 1205

## 2016-04-08 NOTE — ED Notes (Signed)
Pt.daughter on hospital campus, please call with any updates. Suzanna Obey, [phone]

## 2016-04-08 NOTE — ED Notes (Signed)
Pt. PCP Dr. Kingsley Spittle called to speak with MD regarding pt. Status and plan for care (concerns for psych admission). This RN verbalized to MD Mariea Clonts, PCP asked for call back. [phone]

## 2016-04-08 NOTE — ED Notes (Addendum)
Respiratory informed to obtain blood cultures via arterial blood.  Respiratory unsuccessful, MD notified.

## 2016-04-08 NOTE — ED Notes (Signed)
Pt has had multiple IV attempts without success.  Lab contacted for blood draw.  Second set of blood cultures unable to be obtained.

## 2016-04-08 NOTE — ED Notes (Addendum)
Pt. Provided orange juice at pts. Request with approval from admitting MD. Pt. In good spirits and verbalizes good thoughts about her daughter bringing her food soon.

## 2016-04-08 NOTE — Progress Notes (Signed)
Notified Dr.Vaickute of pt refusal to let lab draw her 8pm troponin. Acknowledged and no new orders. Try again for am labs

## 2016-04-08 NOTE — ED Notes (Signed)
Pt. Daughter called EMS because Pt. Had altered mental status. Pt. Daughter reports no known medication changes.

## 2016-04-08 NOTE — H&P (Addendum)
[facility] NAME: [name]    MR#:  [id number]  DATE OF BIRTH:  18-Sep-1961  DATE OF ADMISSION:  04/08/2016  PRIMARY CARE PHYSICIAN: Ellamae Sia, MD   REQUESTING/REFERRING PHYSICIAN:   CHIEF COMPLAINT:   Chief Complaint  Patient presents with  . Altered Mental Status    HISTORY OF PRESENT ILLNESS: [name]  is a 55 y.o. female with a known history of  Multiple medical problems including diabetes, essential hypertension, coronary artery disease, end-stage renal disease, on dialysis Mondays, Wednesdays, Fridays, who presents to the hospital with complaints of confusion. Apparently, the patient's daughter brought her in for confusion and aggressive behavior. Patient was seen in emergency room for nausea, vomiting and abdominal pain in the morning yesterday, she has full dialysis session yesterday with no significant problems. However, after hemodialysis. She was back in the emergency room for hypoglycemic episode and altered mental status related to hypoglycemia, treated in emergency room and released. Now she presents back with confusion, shouting aggression. On arrival to emergency room, patient expressed sadness about patient's children not loving her. Patient has been living with her son, however, has taken care of her grandchildren up until 6 months ago, now patient's daughter regained custody of her children and took them away from her mother, patient is very unhappy and told emergency room physician that" they took my children away". She had lab studies done in the emergency room revealing mild elevation of troponin, ammonia. The patient denies any chest pains or any other kind of physical discomfort.  PAST MEDICAL HISTORY:   Past Medical History  Diagnosis Date  . Asthma   . Collagen vascular disease ([place])   . Diabetes mellitus without complication ([place])   . Hypertension   . Coronary artery disease     a. cath 2013:  stenting to RCA (report not available); b. cath 2014: LM nl, pLAD 40%, mLAD nl, ost LCx 40%, mid LCx nl, pRCA 30% @ site of prior stent, mRCA 50%  . Diabetic neuropathy ([place])   . ESRD (end stage renal disease) on dialysis ([place])     M-W-F  . GERD (gastroesophageal reflux disease)   . Hx of pancreatitis 2015  . Myocardial infarction ([place])   . Pneumonia   . Mitral regurgitation     a. echo 10/2013: EF 62%, noWMA, mildly dilated LA, mild to mod MR/TR, GR1DD  . COPD (chronic obstructive pulmonary disease) ([place])   . chronic diastolic CHF [id number]  . dialysis 2006  . Depression   . Anxiety   . Arthritis   . Headache   . Anemia   . Renal insufficiency     PAST SURGICAL HISTORY: Past Surgical History  Procedure Laterality Date  . Cholecystectomy    . Appendectomy    . Abdominal hysterectomy    . Dialysis fistula creation Left     upper arm  . Esophagogastroduodenoscopy N/A 03/08/2015    Procedure: ESOPHAGOGASTRODUODENOSCOPY (EGD);  Surgeon: Manya Silvas, MD;  Location: [facility] ENDOSCOPY;  Service: Endoscopy;  Laterality: N/A;  . Cardiac catheterization Left 07/26/2015    Procedure: Left Heart Cath and Coronary Angiography;  Surgeon: Dionisio David, MD;  Location: [place] CV LAB;  Service: Cardiovascular;  Laterality: Left;  Marland Kitchen Eye surgery    . Fecal transplant N/A 08/23/2015    Procedure: FECAL TRANSPLANT;  Surgeon: Manya Silvas, MD;  Location: [facility] ENDOSCOPY;  Service: Endoscopy;  Laterality: N/A;  . Peripheral  vascular catheterization N/A 12/20/2015    Procedure: Thrombectomy of dialysis access versus permcath placement;  Surgeon: Algernon Huxley, MD;  Location: [place] CV LAB;  Service: Cardiovascular;  Laterality: N/A;  . Peripheral vascular catheterization N/A 12/20/2015    Procedure: A/V Shunt Intervention;  Surgeon: Algernon Huxley, MD;  Location: [place] CV LAB;  Service: Cardiovascular;  Laterality: N/A;  . Peripheral vascular catheterization N/A 12/20/2015     Procedure: A/V Shuntogram/Fistulagram;  Surgeon: Algernon Huxley, MD;  Location: [place] CV LAB;  Service: Cardiovascular;  Laterality: N/A;  . Peripheral vascular catheterization N/A 01/02/2016    Procedure: A/V Shuntogram/Fistulagram;  Surgeon: Algernon Huxley, MD;  Location: [place] CV LAB;  Service: Cardiovascular;  Laterality: N/A;  . Peripheral vascular catheterization N/A 01/02/2016    Procedure: A/V Shunt Intervention;  Surgeon: Algernon Huxley, MD;  Location: [place] [facility] LAB;  Service: Cardiovascular;  Laterality: N/A;  . Esophagogastroduodenoscopy (egd) with propofol N/A 03/18/2016    Procedure: ESOPHAGOGASTRODUODENOSCOPY (EGD) WITH PROPOFOL;  Surgeon: [name], MD;  Location: [facility] ENDOSCOPY;  Service: Endoscopy;  Laterality: N/A;    SOCIAL HISTORY:  Social History  Substance Use Topics  . Smoking status: Former Smoker -- 0.50 packs/day    Types: Cigarettes    Quit date: 02/13/2015  . Smokeless tobacco: Never Used  . Alcohol Use: No    FAMILY HISTORY:  Family History  Problem Relation Age of Onset  . Kidney disease Mother   . Diabetes Mother     DRUG ALLERGIES:  Allergies  Allergen Reactions  . Compazine [Prochlorperazine Edisylate] Anaphylaxis and Nausea And Vomiting    23 Jul - patient relates that she takes promethazine frequently with no problems.  . Ace Inhibitors Swelling  . Ativan [Lorazepam] Other (See Comments)    Reaction:  Hallucinations and headaches  . Codeine Nausea And Vomiting  . Dilaudid [Hydromorphone Hcl] Other (See Comments)    Delirium  . Gabapentin Other (See Comments)    Reaction:  Unknown   . Losartan Other (See Comments)    Reaction:  Unknown   . Ondansetron Other (See Comments)    Reaction:  Unknown   . Prochlorperazine Other (See Comments)    Reaction:  Unknown   . Scopolamine Other (See Comments)    Reaction:  Unknown   . Zofran [Ondansetron Hcl] Other (See Comments)    Reaction:  hallucinations   . Oxycodone Anxiety  . Tape  Rash    Review of Systems  Unable to perform ROS: mental status change    MEDICATIONS AT HOME:  Prior to Admission medications   Medication Sig Start Date End Date Taking? Authorizing Provider  acidophilus (RISAQUAD) CAPS capsule Take 1 capsule by mouth daily.   Yes Historical Provider, MD  albuterol (PROVENTIL HFA;VENTOLIN HFA) 108 (90 Base) MCG/ACT inhaler Inhale 2 puffs into the lungs every 4 (four) hours as needed for wheezing or shortness of breath.   Yes Historical Provider, MD  albuterol (PROVENTIL) (2.5 MG/3ML) 0.083% nebulizer solution Take 2.5 mg by nebulization every 4 (four) hours as needed for wheezing or shortness of breath.   Yes Historical Provider, MD  amLODipine (NORVASC) 10 MG tablet Take 10 mg by mouth daily.   Yes Historical Provider, MD  aspirin EC 81 MG tablet Take 81 mg by mouth every morning.    Yes Historical Provider, MD  atorvastatin (LIPITOR) 20 MG tablet Take 20 mg by mouth every evening.   Yes Historical Provider, MD  cetirizine (ZYRTEC)  10 MG tablet Take 10 mg by mouth daily.    Yes Historical Provider, MD  citalopram (CELEXA) 20 MG tablet Take 20 mg by mouth daily.    Yes Historical Provider, MD  clopidogrel (PLAVIX) 75 MG tablet Take 75 mg by mouth daily. Reported on 12/17/2015   Yes Historical Provider, MD  glucose 4 GM chewable tablet Chew 3-4 tablets by mouth as needed for low blood sugar.   Yes Historical Provider, MD  hydrALAZINE (APRESOLINE) 25 MG tablet Take 25 mg by mouth 3 (three) times daily.   Yes Historical Provider, MD  HYDROcodone-acetaminophen (NORCO/VICODIN) 5-325 MG tablet Take 1 tablet by mouth every 6 (six) hours as needed for moderate pain or severe pain. Patient taking differently: Take 1 tablet by mouth 2 (two) times daily as needed for moderate pain or severe pain.  03/19/16  Yes Gladstone Lighter, MD  lipase/protease/amylase (CREON) 12000 UNITS CPEP capsule Take 12,000-36,000 Units by mouth See admin instructions. 36,000 units daily with  breakfast; 24,000 units daily with lunch, and 36,000 units daily with dinner   Yes Historical Provider, MD  metoCLOPramide (REGLAN) 5 MG tablet Take 5 mg by mouth 4 (four) times daily.   Yes Historical Provider, MD  multivitamin (RENA-VIT) TABS tablet Take 1 tablet by mouth daily.   Yes Historical Provider, MD  omega-3 acid ethyl esters (LOVAZA) 1 g capsule Take 1 g by mouth daily.   Yes Historical Provider, MD  pantoprazole (PROTONIX) 40 MG tablet Take 1 tablet (40 mg total) by mouth 2 (two) times daily before a meal. 03/19/16  Yes Gladstone Lighter, MD  pregabalin (LYRICA) 25 MG capsule Take 25 mg by mouth daily.   Yes Historical Provider, MD  promethazine (PHENERGAN) 12.5 MG tablet Take 12.5 mg by mouth every 8 (eight) hours as needed for nausea.   Yes Historical Provider, MD  ranitidine (ZANTAC) 150 MG tablet Take 150 mg by mouth daily.    Yes Historical Provider, MD  ranolazine (RANEXA) 1000 MG SR tablet Take 1,000 mg by mouth 2 (two) times daily.   Yes Historical Provider, MD  sevelamer carbonate (RENVELA) 800 MG tablet Take 800 mg by mouth 3 (three) times daily with meals.    Yes Historical Provider, MD  vitamin B-12 (CYANOCOBALAMIN) 1000 MCG tablet Take 1,000 mcg by mouth daily.   Yes Historical Provider, MD      PHYSICAL EXAMINATION:   VITAL SIGNS: Blood pressure 140/56, pulse 66, temperature 98.1 F (36.7 C), temperature source Axillary, resp. rate 13, SpO2 100 %.  GENERAL:  55 y.o.-year-old patient lying in the bed with no acute distress, side affects, repeats words "no one loves her". Dry oral mucosa.  EYES: Pupils equal, round, reactive to light and accommodation. No scleral icterus. Extraocular muscles intact. Oral mucosa is dry HEENT: Head atraumatic, normocephalic. Oropharynx and nasopharynx clear.  NECK:  Supple, no jugular venous distention. No thyroid enlargement, no tenderness.  LUNGS: Normal breath sounds bilaterally, no wheezing, rales,rhonchi or crepitation. No use of  accessory muscles of respiration.  CARDIOVASCULAR: S1, S2 . Rhythm was regular. 4/6 systolic murmurs with radiation to left axilla, no rubs, or gallops.  ABDOMEN: Soft, nontender, nondistended. Bowel sounds present. No organomegaly or mass.  EXTREMITIES: Trace lower extremity and pedal edema, no cyanosis, or clubbing.  NEUROLOGIC: Cranial nerves II through XII are intact. Muscle strength 5/5 in all extremities. Sensation grossly intact. Gait not checked.  PSYCHIATRIC: The patient is alert and oriented x 1.  SKIN: No obvious rash, lesion, or ulcer.  LABORATORY PANEL:   CBC  Recent Labs Lab 04/04/16 0744 04/07/16 0146 04/07/16 1330 04/08/16 0942  WBC 5.0 5.7 3.2* 6.7  HGB 9.6* 9.4* 9.2* 9.5*  HCT 29.3* 28.0* 28.7* 29.6*  PLT 208 211 197 205  MCV 75.6* 74.3* 75.1* 75.0*  MCH 24.8* 25.0* 24.0* 24.1*  MCHC 32.8 33.6 32.0 32.1  RDW 22.6* 22.2* 21.6* 22.3*  LYMPHSABS  --   --  1.2  --   MONOABS  --   --  0.1*  --   EOSABS  --   --  0.3  --   BASOSABS  --   --  0.0  --    ------------------------------------------------------------------------------------------------------------------  Chemistries   Recent Labs Lab 04/07/16 1330 04/08/16 1032  NA 135 134*  K 3.2* 3.8  CL 94* 97*  CO2 33* 27  GLUCOSE 44* 83  BUN 11 18  CREATININE 2.78* 4.16*  CALCIUM 8.0* 8.3*  MG 2.2  --   AST 27 27  ALT 17 16  ALKPHOS 98 100  BILITOT 0.8 0.5   ------------------------------------------------------------------------------------------------------------------  Cardiac Enzymes  Recent Labs Lab 04/07/16 1330 04/08/16 1032  TROPONINI <0.03 0.04*   ------------------------------------------------------------------------------------------------------------------  RADIOLOGY: Ct Head Wo Contrast  04/08/2016  CLINICAL DATA:  Altered mental status. EXAM: CT HEAD WITHOUT CONTRAST TECHNIQUE: Contiguous axial images were obtained from the base of the skull through the vertex without  intravenous contrast. COMPARISON:  CT scan of April 24, 2015. FINDINGS: Bony calvarium appears intact. No mass effect or midline shift is noted. Ventricular size is within normal limits. There is no evidence of mass lesion, hemorrhage or acute infarction. IMPRESSION: Normal head CT. Electronically Signed   By: Marijo Conception, M.D.   On: 04/08/2016 09:05   Dg Chest Portable 1 View  04/07/2016  CLINICAL DATA:  Altered mental status post dialysis today EXAM: PORTABLE CHEST 1 VIEW COMPARISON:  03/27/2016 FINDINGS: Cardiomegaly is noted. Mild interstitial prominence bilateral without convincing pulmonary edema. Bilateral small pleural effusion again noted. Worsening atelectasis or infiltrate in right middle lobe. IMPRESSION: Bilateral small pleural effusion again noted. No convincing pulmonary edema. Worsening atelectasis or infiltrate in right middle lobe. Electronically Signed   By: Lahoma Crocker M.D.   On: 04/07/2016 14:22    EKG: Orders placed or performed during the hospital encounter of 04/08/16  . ED EKG  . ED EKG  . EKG 12-Lead  . EKG 12-Lead  . EKG 12-Lead  . EKG 12-Lead  EKG revealed sinus rhythm at 70 bpm, low voltage QRS, right axis deviation, nonspecific ST-T changes in lateral leads, prolonged QTC to 490 ms  IMPRESSION AND PLAN:  Active Problems:   Altered mental status   Hyperammonemia (HCC)   Elevated troponin   Depression   ESRD (end stage renal disease) ([place])  #1. Altered mental status of unclear etiology, could be hepatic encephalopathy, initiate patient on lactulose, supportive therapy, neuro checks, get psychiatrist and neurologist involved. CT of head was unremarkable. #2. Elevated ammonia level, initiate lactulose, Xifaxan, follow clinically, get GI consult when coverage is available, possibly tomorrow.  #3. Elevated troponin, no chest pains, initiate patient on aspirin, Plavix, nitroglycerin topically, follow cardiac enzymes 3, get cardiologist involved, echocardiogram  January 2017 revealed ejection fraction of 40%, moderate LVH, moderate mitral regurgitation, severe tricuspid regurgitation. #4. History of depression, get psychiatrist involved for further evaluation, continue outpatient medications for now, initiate Haldol as needed #5. End-stage renal disease, nephrology consult, next dialysis tomorrow   All the records are reviewed and  case discussed with ED provider. Management plans discussed with the patient, family and they are in agreement.  CODE STATUS: Code Status History    Date Active Date Inactive Code Status Order ID Comments User Context   03/15/2016  1:22 PM 03/20/2016 12:19 AM Full Code [id number]  Bettey Costa, MD Inpatient   02/16/2016  2:09 PM 02/19/2016 10:38 PM Full Code [id number]  Bettey Costa, MD ED   12/17/2015 11:39 AM 12/21/2015 10:09 PM Full Code [id number]  Dustin Flock, MD ED   11/12/2015  4:02 PM 12/01/2015  7:43 PM Full Code [id number]  Bettey Costa, MD Inpatient   10/07/2015  9:37 AM 10/12/2015  7:59 PM Full Code [id number]  Fritzi Mandes, MD ED   10/03/2015  3:55 AM 10/05/2015 10:30 PM Full Code [id number]  Lance Coon, MD Inpatient   08/31/2015  4:00 PM 09/03/2015 10:09 PM Full Code [id number]  Demetrios Loll, MD ED   07/26/2015  3:28 PM 07/26/2015  9:36 PM Full Code [id number]  Dionisio David, MD Inpatient   07/22/2015  5:36 PM 07/26/2015  3:28 PM Full Code [id number]  Vaughan Basta, MD Inpatient   06/05/2015  8:39 PM 06/07/2015  8:03 PM Full Code [id number]  Bettey Costa, MD Inpatient   05/21/2015  9:42 PM 05/24/2015  4:26 PM Full Code [id number]  Henreitta Leber, MD Inpatient   04/24/2015  8:55 AM 04/26/2015 10:11 PM Full Code [id number]  Demetrios Loll, MD Inpatient   04/01/2015  3:58 PM 04/05/2015  8:07 PM Full Code [id number]  Epifanio Lesches, MD ED   03/06/2015  8:05 AM 03/09/2015  9:11 PM Full Code [id number]  Juluis Mire, MD Inpatient   02/19/2015  4:14 PM 02/24/2015  5:41 PM Full Code [id number]  Aldean Jewett, MD Inpatient       TOTAL TIME TAKING  CARE OF THIS PATIENT: 50 minutes.    Theodoro Grist M.D on 04/08/2016 at 12:53 PM  Between 7am to 6pm - Pager - [phone] After 6pm go to [email or web address] - password [email or web address] [facility]  Office  [phone]  CC: Primary care physician; Ellamae Sia, MD

## 2016-04-08 NOTE — Consult Note (Signed)
[facility] Consult   Reason for Consult:  Consult for 55 year old woman with multiple medical problems including depression who presented to the hospital 3 times in the last day with somatic symptoms. Concern about depression. Referring Physician:  Ether Griffins Patient Identification: [name] MRN:  [id number] Principal Diagnosis: Depression, major, recurrent, severe with psychosis ([place]) Diagnosis:   Patient Active Problem List   Diagnosis Date Noted  . Altered mental status [R41.82] 04/08/2016  . Hyperammonemia ([place]) [E72.20] 04/08/2016  . Elevated troponin [R79.89] 04/08/2016  . ESRD (end stage renal disease) ([place]) [N18.6] 04/08/2016  . Depression [F32.9] 04/08/2016  . Depression, major, recurrent, severe with psychosis ([place]) [F33.3] 04/08/2016  . Blood in stool [K92.1]   . Intractable cyclical vomiting with nausea [G43.A1]   . Reflux esophagitis [K21.0]   . Gastritis [K29.70]   . Generalized abdominal pain [R10.84]   . Uncontrollable vomiting [R11.10]   . Major depressive disorder, recurrent episode, moderate ([place]) [F33.1] 03/15/2016  . Adjustment disorder with mixed anxiety and depressed mood [F43.23] 03/15/2016  . Malnutrition of moderate degree [E44.0] 12/01/2015  . Renal mass [N28.89]   . Dyspnea [R06.00]   . Acute renal failure ([place]) [N17.9]   . Respiratory failure ([place]) [J96.90]   . High temperature [R50.9] 11/14/2015  . Pulmonary edema [J81.1]   . Encounter for central line placement [Z45.2]   . Encounter for orogastric (OG) tube placement [Z46.59]   . Nausea [R11.0] 11/12/2015  . Hyperkalemia [E87.5] 10/03/2015  . C. difficile diarrhea [A04.7] 07/22/2015  . Pneumonia [J18.9] 05/21/2015  . Hypoglycemia [E16.2] 04/24/2015  . Unresponsiveness [R40.4] 04/24/2015  . Bradycardia [R00.1] 04/24/2015  . Hypothermia [T68.XXXA] 04/24/2015  . Acute respiratory failure ([place]) [J96.00] 04/24/2015  . Acute diastolic CHF (congestive heart failure) ([place]) [I50.31]  04/05/2015  . Diabetic gastroparesis ([place]) [E11.43, K31.84] 04/05/2015  . Hypokalemia [E87.6] 04/05/2015  . Generalized weakness [R53.1] 04/05/2015  . Acute pulmonary edema ([place]) [J81.0] 04/03/2015  . Nausea and vomiting [R11.2] 04/03/2015  . Hypoglycemia associated with diabetes ([place]) [E11.649] 04/03/2015  . Anemia of chronic disease [D63.8] 04/03/2015  . Secondary hyperparathyroidism ([place]) [N25.81] 04/03/2015  . Pressure ulcer [L89.90] 04/02/2015  . Acute respiratory failure with hypoxia ([place]) [J96.01] 04/01/2015  . Adjustment disorder with anxiety [F43.22] 03/14/2015  . Somatic symptom disorder, mild [F45.8] 03/08/2015  . Coronary artery disease involving native coronary artery of native heart without angina pectoris [I25.10]   . Nausea & vomiting [R11.2] 03/06/2015  . Abdominal pain [R10.9] 03/06/2015  . DM (diabetes mellitus) ([place]) [E11.9] 03/06/2015  . HTN (hypertension) [I10] 03/06/2015  . Gastroparesis [K31.84] 02/24/2015  . Pleural effusion [J90] 02/19/2015  . HCAP (healthcare-associated pneumonia) [J18.9] 02/19/2015  . End-stage renal disease on hemodialysis ([place]) [N18.6, Z99.2] 02/19/2015    Total Time spent with patient: 1 hour  Subjective:   [name] is a 55 y.o. female patient admitted with "that woman took my children away".  HPI:  Patient interviewed. Chart reviewed. Labs and vitals reviewed. Patient known from previous hospital stay. 55 year old woman has a past history of major depression and recurrent mental health problems. She also has multiple serious chronic medical problems. She presented to the emergency room 3 times within about a 2 day period for physical symptoms including complaints of abdominal pain and mental status changes. Hospitalist evaluating her today felt that the patient's depression was severe. On interview today the patient is a little disorganized. Sometimes it is hard to keep her attention for the interview. She gets tangential and talks  about how some woman took her children away from her. When asked who this was she says it is someone who says they are her daughter. Then she starts complaining about other family members. None of the story really makes sense. Patient says that her mood has been upset. She has not been sleeping or eating well. She has been feeling sick. She says she has been compliant with her medicine. Patient talks about hearing songs and voices in her head that are keeping her up all night long. She gets tearful very easily. Denies any suicidal thoughts.  Social history: Patient apparently is living with her son. She has multiple complaints that change from moment to moment about several members of the family. It's a little hard to follow exactly what she is complaining about.  Medical history: Multiple medical problems including end-stage renal disease on dialysis, diabetes hypertension history of coronary artery disease history of congestive heart failure  Past Psychiatric History: Patient is not a very reliable historian. Based on old notes it appears that the patient has had recurrent mental health issues. It is reported that she's had a couple of psychiatric hospitalizations in the past although they were years ago. Not clear if she is ever tried to harm herself. She denies it today. Had been seen by psychiatrists and maintained on antidepressants for years. Family reports that she has had periods of decompensation with confusion mostly when she is medically sick.  Risk to Self: Is patient at risk for suicide?: No Risk to Others:   Prior Inpatient Therapy:   Prior Outpatient Therapy:    Past Medical History:  Past Medical History  Diagnosis Date  . Asthma   . Collagen vascular disease ([place])   . Diabetes mellitus without complication ([place])   . Hypertension   . Coronary artery disease     a. cath 2013: stenting to RCA (report not available); b. cath 2014: LM nl, pLAD 40%, mLAD nl, ost LCx 40%, mid LCx nl,  pRCA 30% @ site of prior stent, mRCA 50%  . Diabetic neuropathy ([place])   . ESRD (end stage renal disease) on dialysis ([place])     M-W-F  . GERD (gastroesophageal reflux disease)   . Hx of pancreatitis 2015  . Myocardial infarction ([place])   . Pneumonia   . Mitral regurgitation     a. echo 10/2013: EF 62%, noWMA, mildly dilated LA, mild to mod MR/TR, GR1DD  . COPD (chronic obstructive pulmonary disease) ([place])   . chronic diastolic CHF 6/33/3545  . dialysis 2006  . Depression   . Anxiety   . Arthritis   . Headache   . Anemia   . Renal insufficiency     Past Surgical History  Procedure Laterality Date  . Cholecystectomy    . Appendectomy    . Abdominal hysterectomy    . Dialysis fistula creation Left     upper arm  . Esophagogastroduodenoscopy N/A 03/08/2015    Procedure: ESOPHAGOGASTRODUODENOSCOPY (EGD);  Surgeon: Manya Silvas, MD;  Location[facility] ENDOSCOPY;  Service: Endoscopy;  Laterality: N/A;  . Cardiac catheterization Left 07/26/2015    Procedure: Left Heart Cath and Coronary Angiography;  Surgeon: Dionisio David, MD;  Location: [facility] CV LAB;  Service: Cardiovascular;  Laterality: Left;  Marland Kitchen Eye surgery    . Fecal transplant N/A 08/23/2015    Procedure: FECAL TRANSPLANT;  Surgeon: Manya Silvas, MD;  Location: [facility] ENDOSCOPY;  Service: Endoscopy;  Laterality: N/A;  . Peripheral vascular catheterization N/A 12/20/2015  Procedure: Thrombectomy of dialysis access versus permcath placement;  Surgeon: Algernon Huxley, MD;  Location: [facility] CV LAB;  Service: Cardiovascular;  Laterality: N/A;  . Peripheral vascular catheterization N/A 12/20/2015    Procedure: A/V Shunt Intervention;  Surgeon: Algernon Huxley, MD;  Location: [facility] CV LAB;  Service: Cardiovascular;  Laterality: N/A;  . Peripheral vascular catheterization N/A 12/20/2015    Procedure: A/V Shuntogram/Fistulagram;  Surgeon: Algernon Huxley, MD;  Location: [facility] CV LAB;  Service: Cardiovascular;  Laterality:  N/A;  . Peripheral vascular catheterization N/A 01/02/2016    Procedure: A/V Shuntogram/Fistulagram;  Surgeon: Algernon Huxley, MD;  Location: [facility] CV LAB;  Service: Cardiovascular;  Laterality: N/A;  . Peripheral vascular catheterization N/A 01/02/2016    Procedure: A/V Shunt Intervention;  Surgeon: Algernon Huxley, MD;  Location: [facility] CV LAB;  Service: Cardiovascular;  Laterality: N/A;  . Esophagogastroduodenoscopy (egd) with propofol N/A 03/18/2016    Procedure: ESOPHAGOGASTRODUODENOSCOPY (EGD) WITH PROPOFOL;  Surgeon: [name], MD;  Location: [facility] ENDOSCOPY;  Service: Endoscopy;  Laterality: N/A;   Family History:  Family History  Problem Relation Age of Onset  . Kidney disease Mother   . Diabetes Mother    Family Psychiatric  History: Unknown whether there is any family history Social History:  History  Alcohol Use No     History  Drug Use No    Social History   Social History  . Marital Status: Divorced    Spouse Name: N/A  . Number of Children: N/A  . Years of Education: N/A   Social History Main Topics  . Smoking status: Former Smoker -- 0.50 packs/day    Types: Cigarettes    Quit date: 02/13/2015  . Smokeless tobacco: Never Used  . Alcohol Use: No  . Drug Use: No  . Sexual Activity: Not Asked   Other Topics Concern  . None   Social History Narrative   Additional Social History:    Allergies:   Allergies  Allergen Reactions  . Compazine [Prochlorperazine Edisylate] Anaphylaxis and Nausea And Vomiting    23 Jul - patient relates that she takes promethazine frequently with no problems.  . Ace Inhibitors Swelling  . Ativan [Lorazepam] Other (See Comments)    Reaction:  Hallucinations and headaches  . Codeine Nausea And Vomiting  . Dilaudid [Hydromorphone Hcl] Other (See Comments)    Delirium  . Gabapentin Other (See Comments)    Reaction:  Unknown   . Losartan Other (See Comments)    Reaction:  Unknown   . Ondansetron Other (See Comments)     Reaction:  Unknown   . Prochlorperazine Other (See Comments)    Reaction:  Unknown   . Scopolamine Other (See Comments)    Reaction:  Unknown   . Zofran [Ondansetron Hcl] Other (See Comments)    Reaction:  hallucinations   . Oxycodone Anxiety  . Tape Rash    Labs:  Results for orders placed or performed during the hospital encounter of 04/08/16 (from the past 48 hour(s))  Hepatic function panel     Status: Abnormal   Collection Time: 04/08/16  9:06 AM  Result Value Ref Range   Total Protein 7.7 6.5 - 8.1 g/dL   Albumin 2.9 (L) 3.5 - 5.0 g/dL   AST 36 15 - 41 U/L   ALT 15 14 - 54 U/L   Alkaline Phosphatase 93 38 - 126 U/L   Total Bilirubin 0.5 0.3 - 1.2 mg/dL   Bilirubin, Direct  0.2 0.1 - 0.5 mg/dL   Indirect Bilirubin 0.3 0.3 - 0.9 mg/dL  CBC     Status: Abnormal   Collection Time: 04/08/16  9:42 AM  Result Value Ref Range   WBC 6.7 3.6 - 11.0 K/uL   RBC 3.94 3.80 - 5.20 MIL/uL   Hemoglobin 9.5 (L) 12.0 - 16.0 g/dL   HCT 29.6 (L) 35.0 - 47.0 %   MCV 75.0 (L) 80.0 - 100.0 fL   MCH 24.1 (L) 26.0 - 34.0 pg   MCHC 32.1 32.0 - 36.0 g/dL   RDW 22.3 (H) 11.5 - 14.5 %   Platelets 205 150 - 440 K/uL  Troponin I     Status: Abnormal   Collection Time: 04/08/16 10:32 AM  Result Value Ref Range   Troponin I 0.04 (HH) <0.03 ng/mL    Comment: CRITICAL RESULT CALLED TO, READ BACK BY AND VERIFIED WITH  DR. NORMAN AT 3785 04/08/16 SDR   Comprehensive metabolic panel     Status: Abnormal   Collection Time: 04/08/16 10:32 AM  Result Value Ref Range   Sodium 134 (L) 135 - 145 mmol/L   Potassium 3.8 3.5 - 5.1 mmol/L   Chloride 97 (L) 101 - 111 mmol/L   CO2 27 22 - 32 mmol/L   Glucose, Bld 83 65 - 99 mg/dL   BUN 18 6 - 20 mg/dL   Creatinine, Ser 4.16 (H) 0.44 - 1.00 mg/dL   Calcium 8.3 (L) 8.9 - 10.3 mg/dL   Total Protein 8.0 6.5 - 8.1 g/dL   Albumin 3.1 (L) 3.5 - 5.0 g/dL   AST 27 15 - 41 U/L   ALT 16 14 - 54 U/L   Alkaline Phosphatase 100 38 - 126 U/L   Total Bilirubin 0.5  0.3 - 1.2 mg/dL   GFR calc non Af Amer 11 (L) >60 mL/min   GFR calc Af Amer 13 (L) >60 mL/min    Comment: (NOTE) The eGFR has been calculated using the CKD EPI equation. This calculation has not been validated in all clinical situations. eGFR's persistently <60 mL/min signify possible Chronic Kidney Disease.    Anion gap 10 5 - 15  Magnesium     Status: None   Collection Time: 04/08/16 10:32 AM  Result Value Ref Range   Magnesium 2.4 1.7 - 2.4 mg/dL  Ammonia     Status: Abnormal   Collection Time: 04/08/16 10:33 AM  Result Value Ref Range   Ammonia 46 (H) 9 - 35 umol/L  Troponin I     Status: None   Collection Time: 04/08/16  3:02 PM  Result Value Ref Range   Troponin I <0.03 <0.03 ng/mL  Glucose, capillary     Status: Abnormal   Collection Time: 04/08/16  4:19 PM  Result Value Ref Range   Glucose-Capillary 219 (H) 65 - 99 mg/dL  Glucose, capillary     Status: Abnormal   Collection Time: 04/08/16  5:28 PM  Result Value Ref Range   Glucose-Capillary 193 (H) 65 - 99 mg/dL    Current Facility-Administered Medications  Medication Dose Route Frequency Provider Last Rate Last Dose  . 0.9 %  sodium chloride infusion  250 mL Intravenous PRN Theodoro Grist, MD      . acidophilus (RISAQUAD) capsule 1 capsule  1 capsule Oral Daily Theodoro Grist, MD   1 capsule at 04/08/16 1430  . albuterol (PROVENTIL) (2.5 MG/3ML) 0.083% nebulizer solution 2.5 mg  2.5 mg Nebulization Q4H PRN Theodoro Grist, MD      .  amLODipine (NORVASC) tablet 10 mg  10 mg Oral Daily Theodoro Grist, MD   10 mg at 04/08/16 1430  . [START ON 04/09/2016] aspirin EC tablet 81 mg  81 mg Oral BH-q7a Theodoro Grist, MD      . atorvastatin (LIPITOR) tablet 20 mg  20 mg Oral QPM Theodoro Grist, MD   20 mg at 04/08/16 1740  . clopidogrel (PLAVIX) tablet 75 mg  75 mg Oral Daily Theodoro Grist, MD   75 mg at 04/08/16 1430  . famotidine (PEPCID) tablet 20 mg  20 mg Oral Daily Theodoro Grist, MD   20 mg at 04/08/16 1430  . FLUoxetine  (PROZAC) capsule 20 mg  20 mg Oral Daily John T Clapacs, MD      . haloperidol lactate (HALDOL) injection 1 mg  1 mg Intravenous Q4H PRN Theodoro Grist, MD      . heparin injection 5,000 Units  5,000 Units Subcutaneous Q8H Theodoro Grist, MD   5,000 Units at 04/08/16 1451  . hydrALAZINE (APRESOLINE) tablet 25 mg  25 mg Oral TID Theodoro Grist, MD   25 mg at 04/08/16 1740  . HYDROcodone-acetaminophen (NORCO/VICODIN) 5-325 MG per tablet 1 tablet  1 tablet Oral BID PRN Theodoro Grist, MD   1 tablet at 04/08/16 1741  . insulin aspart (novoLOG) injection 0-9 Units  0-9 Units Subcutaneous TID WC Theodoro Grist, MD   2 Units at 04/08/16 1741  . insulin aspart (novoLOG) injection 3 Units  3 Units Subcutaneous TID WC Theodoro Grist, MD   3 Units at 04/08/16 1741  . lactulose (CHRONULAC) 10 GM/15ML solution 30 g  30 g Oral BID PRN Theodoro Grist, MD      . Derrill Memo ON 04/09/2016] lipase/protease/amylase (CREON) capsule 24,000 Units  24,000 Units Oral Q lunch Theodoro Grist, MD      . lipase/protease/amylase (CREON) capsule 36,000 Units  36,000 Units Oral BID Theodoro Grist, MD   36,000 Units at 04/08/16 1740  . loratadine (CLARITIN) tablet 10 mg  10 mg Oral Daily Theodoro Grist, MD   10 mg at 04/08/16 1430  . metoCLOPramide (REGLAN) tablet 5 mg  5 mg Oral QID Theodoro Grist, MD   5 mg at 04/08/16 1430  . multivitamin (RENA-VIT) tablet 1 tablet  1 tablet Oral Daily Theodoro Grist, MD   1 tablet at 04/08/16 1430  . OLANZapine (ZYPREXA) tablet 2.5 mg  2.5 mg Oral QHS Gonzella Lex, MD      . omega-3 acid ethyl esters (LOVAZA) capsule 1 g  1 g Oral Daily Theodoro Grist, MD   1 g at 04/08/16 1430  . pantoprazole (PROTONIX) EC tablet 40 mg  40 mg Oral BID AC Theodoro Grist, MD   40 mg at 04/08/16 1740  . pregabalin (LYRICA) capsule 25 mg  25 mg Oral Daily Theodoro Grist, MD   25 mg at 04/08/16 1740  . promethazine (PHENERGAN) tablet 12.5 mg  12.5 mg Oral Q8H PRN Theodoro Grist, MD      . ranolazine (RANEXA) 12 hr tablet 1,000 mg   1,000 mg Oral BID Theodoro Grist, MD   1,000 mg at 04/08/16 1430  . rifaximin (XIFAXAN) tablet 550 mg  550 mg Oral BID Theodoro Grist, MD   550 mg at 04/08/16 1415  . sevelamer carbonate (RENVELA) tablet 800 mg  800 mg Oral TID WC Theodoro Grist, MD   800 mg at 04/08/16 1740  . sodium chloride flush (NS) 0.9 % injection 3 mL  3 mL Intravenous Q12H Rima Vaickute,  MD   3 mL at 04/08/16 1430  . sodium chloride flush (NS) 0.9 % injection 3 mL  3 mL Intravenous Q12H Theodoro Grist, MD   3 mL at 04/08/16 1430  . sodium chloride flush (NS) 0.9 % injection 3 mL  3 mL Intravenous PRN Theodoro Grist, MD      . vitamin B-12 (CYANOCOBALAMIN) tablet 1,000 mcg  1,000 mcg Oral Daily Theodoro Grist, MD   1,000 mcg at 04/08/16 1430    Musculoskeletal: Strength & Muscle Tone: decreased and atrophy Gait & Station: unsteady Patient leans: N/A  Psychiatric Specialty Exam: Physical Exam  Nursing note and vitals reviewed. Constitutional: She appears well-developed. She appears distressed.  HENT:  Head: Normocephalic and atraumatic.  Eyes: Conjunctivae are normal. Pupils are equal, round, and reactive to light.  Neck: Normal range of motion.  Cardiovascular: Normal heart sounds.   Respiratory: She is in respiratory distress.  GI: Soft.  Musculoskeletal: Normal range of motion.  Neurological: She is alert.  Skin: Skin is warm and dry.  Psychiatric: Her mood appears anxious. Her affect is labile. Her speech is delayed and tangential. She is withdrawn. Thought content is paranoid. Cognition and memory are impaired. She expresses impulsivity. She expresses no suicidal ideation. She is inattentive.    Review of Systems  Constitutional: Negative.   HENT: Negative.   Eyes: Negative.   Respiratory: Positive for shortness of breath.   Cardiovascular: Positive for leg swelling.  Gastrointestinal: Positive for nausea, vomiting and abdominal pain.  Musculoskeletal: Negative.   Skin: Negative.   Neurological: Negative.    Psychiatric/Behavioral: Positive for depression. Negative for suicidal ideas, hallucinations, memory loss and substance abuse. The patient is nervous/anxious and has insomnia.     Blood pressure 141/48, pulse 73, temperature 98.3 F (36.8 C), temperature source Oral, resp. rate 17, height 5' 5"  (1.651 m), weight 68.402 kg (150 lb 12.8 oz), SpO2 100 %.Body mass index is 25.09 kg/(m^2).  General Appearance: Casual  Eye Contact:  Minimal  Speech:  Blocked and Garbled  Volume:  Decreased  Mood:  Anxious and Irritable  Affect:  Labile  Thought Process:  Disorganized  Orientation:  Full (Time, Place, and Person)  Thought Content:  Illogical and Paranoid Ideation  Suicidal Thoughts:  No  Homicidal Thoughts:  No  Memory:  Immediate;   Good Recent;   Fair Remote;   Fair  Judgement:  Impaired  Insight:  Shallow  Psychomotor Activity:  Decreased  Concentration:  Concentration: Poor  Recall:  Poor  Fund of Knowledge:  Poor  Language:  Fair  Akathisia:  No  Handed:  Right  AIMS (if indicated):     Assets:  Housing Social Support  ADL's:  Impaired  Cognition:  Impaired,  Mild  Sleep:        Treatment Plan Summary: Daily contact with patient to assess and evaluate symptoms and progress in treatment, Medication management and Plan 55 year old woman with a history of depression and possibly more complicated mental health problems who presents with altered mental status with confusion. The most remarkable thing in my interviewing her today is that she is confused disorganized and seems to be paranoid at times and that she is complaining of auditory hallucinations. Her mood is labile. No evidence however of dangerousness to self. Additionally I think it's important that I note that her QT interval has been consistently increased on her EKGs and that was raised as a concern regarding her medicine. Not clear to me whether this is more of  a severe depression with psychotic features or a delirium or  some combination thereof. I am going to make some medicine changes. Citalopram is particularly associated with elevated QT interval. I will discontinue that and start fluoxetine which is somewhat less associated with QT prolongation. Of the antipsychotics newer generation Zyprexa is relatively less likely to cause QT prolongation. I'm going to start a very low dose of that mood lability confusion and hallucinations. I will continue to follow-up regularly.  Disposition: Supportive therapy provided about ongoing stressors. Discussed crisis plan, support from social network, calling 911, coming to the Emergency Department, and calling Suicide Hotline.  Alethia Berthold, MD 04/08/2016 6:48 PM

## 2016-04-09 LAB — COMPREHENSIVE METABOLIC PANEL
ALT: 16 U/L (ref 14–54)
AST: 28 U/L (ref 15–41)
Albumin: 3.5 g/dL (ref 3.5–5.0)
Alkaline Phosphatase: 112 U/L (ref 38–126)
Anion gap: 10 (ref 5–15)
BUN: 23 mg/dL — ABNORMAL HIGH (ref 6–20)
CO2: 31 mmol/L (ref 22–32)
Calcium: 8.9 mg/dL (ref 8.9–10.3)
Chloride: 92 mmol/L — ABNORMAL LOW (ref 101–111)
Creatinine, Ser: 4.78 mg/dL — ABNORMAL HIGH (ref 0.44–1.00)
GFR calc Af Amer: 11 mL/min — ABNORMAL LOW (ref >60)
GFR calc non Af Amer: 9 mL/min — ABNORMAL LOW (ref >60)
Glucose, Bld: 119 mg/dL — ABNORMAL HIGH (ref 65–99)
Potassium: 4.3 mmol/L (ref 3.5–5.1)
Sodium: 133 mmol/L — ABNORMAL LOW (ref 135–145)
Total Bilirubin: 0.6 mg/dL (ref 0.3–1.2)
Total Protein: 9.3 g/dL — ABNORMAL HIGH (ref 6.5–8.1)

## 2016-04-09 LAB — TROPONIN I: Troponin I: <0.03 ng/mL (ref <0.03)

## 2016-04-09 LAB — GLUCOSE, CAPILLARY
Glucose-Capillary: 48 mg/dL — ABNORMAL LOW (ref 65–99)
Glucose-Capillary: 57 mg/dL — ABNORMAL LOW (ref 65–99)
Glucose-Capillary: 90 mg/dL (ref 65–99)
Glucose-Capillary: 98 mg/dL (ref 65–99)
Glucose-Capillary: 91 mg/dL (ref 65–99)
Glucose-Capillary: 114 mg/dL — ABNORMAL HIGH (ref 65–99)

## 2016-04-09 LAB — CBC
HCT: 30 % — ABNORMAL LOW (ref 35.0–47.0)
Hemoglobin: 9.8 g/dL — ABNORMAL LOW (ref 12.0–16.0)
MCH: 24.6 pg — ABNORMAL LOW (ref 26.0–34.0)
MCHC: 32.6 g/dL (ref 32.0–36.0)
MCV: 75.4 fL — ABNORMAL LOW (ref 80.0–100.0)
Platelets: 216 10*3/uL (ref 150–440)
RBC: 3.97 MIL/uL (ref 3.80–5.20)
RDW: 21.6 % — ABNORMAL HIGH (ref 11.5–14.5)
WBC: 8.7 10*3/uL (ref 3.6–11.0)

## 2016-04-09 LAB — HEMOGLOBIN A1C: Hgb A1c MFr Bld: 5.1 % (ref 4.0–6.0)

## 2016-04-09 MED ORDER — HYDROCODONE-ACETAMINOPHEN 5-325 MG PO TABS
1.0000 | ORAL_TABLET | Freq: Four times a day (QID) | ORAL | Status: DC | PRN
  Administered 2016-04-09 – 2016-04-11 (×4): 1 via ORAL
  Filled 2016-04-09 (×4): qty 1

## 2016-04-09 NOTE — Progress Notes (Signed)
Dialysis complete

## 2016-04-09 NOTE — Progress Notes (Signed)
PT Cancellation Note  Patient Details Name: RHYANNON BARRINEAU MRN: [id number] DOB: [birth year]   Cancelled Treatment:    Reason Eval/Treat Not Completed: Patient at procedure or test/unavailable (Consult received and chart reviewed.  Patient currently undergoing dialysis.  Will re-attempt at later time/date as medically appropriate.)   Adain Geurin H. Owens Shark, PT, DPT, NCS 04/09/2016, 2:34 PM [phone]

## 2016-04-09 NOTE — Progress Notes (Signed)
Pre Dialysis 

## 2016-04-09 NOTE — Progress Notes (Signed)
[facility] NAME: [name]    MR#:  [id number]  DATE OF BIRTH:  November 13, 1960  SUBJECTIVE:  CHIEF COMPLAINT:   Chief Complaint  Patient presents with  . Altered Mental Status   -Multiple admissions for different reasons. This time admitted for confusion. -More alert this morning. Complaining of myoclonic jerks. -Due for dialysis today  REVIEW OF SYSTEMS:  Review of Systems  Constitutional: Positive for malaise/fatigue. Negative for fever and chills.  HENT: Positive for hearing loss. Negative for ear discharge, ear pain and nosebleeds.   Eyes: Positive for blurred vision. Negative for double vision.  Respiratory: Negative for cough, shortness of breath and wheezing.   Cardiovascular: Negative for chest pain, palpitations and leg swelling.  Gastrointestinal: Positive for abdominal pain. Negative for nausea, vomiting, diarrhea and constipation.  Genitourinary: Negative for dysuria.  Musculoskeletal: Positive for back pain.  Neurological: Positive for weakness. Negative for dizziness, sensory change, speech change, focal weakness, seizures and headaches.       Myoclonic jerks  Psychiatric/Behavioral: Positive for depression.    DRUG ALLERGIES:   Allergies  Allergen Reactions  . Compazine [Prochlorperazine Edisylate] Anaphylaxis and Nausea And Vomiting    23 Jul - patient relates that she takes promethazine frequently with no problems.  . Ace Inhibitors Swelling  . Ativan [Lorazepam] Other (See Comments)    Reaction:  Hallucinations and headaches  . Codeine Nausea And Vomiting  . Dilaudid [Hydromorphone Hcl] Other (See Comments)    Delirium  . Gabapentin Other (See Comments)    Reaction:  Unknown   . Losartan Other (See Comments)    Reaction:  Unknown   . Ondansetron Other (See Comments)    Reaction:  Unknown   . Prochlorperazine Other (See Comments)    Reaction:  Unknown   . Scopolamine Other (See Comments)     Reaction:  Unknown   . Zofran [Ondansetron Hcl] Other (See Comments)    Reaction:  hallucinations   . Oxycodone Anxiety  . Tape Rash    VITALS:  Blood pressure 133/61, pulse 76, temperature 98.7 F (37.1 C), temperature source Oral, resp. rate 16, height 5\' 5"  (1.651 m), weight 69.6 kg (153 lb 7 oz), SpO2 100 %.  PHYSICAL EXAMINATION:  Physical Exam  GENERAL:  55 y.o.-year-old patient lying in the bed with no acute distress.  EYES: Pupils equal, round, reactive to light and accommodation. No scleral icterus. Extraocular muscles intact.  HEENT: Head atraumatic, normocephalic. Oropharynx and nasopharynx clear.  NECK:  Supple, no jugular venous distention. No thyroid enlargement, no tenderness.  LUNGS: Normal breath sounds bilaterally, no wheezing, rales,rhonchi or crepitation. No use of accessory muscles of respiration.  CARDIOVASCULAR: S1, S2 normal. No murmurs, rubs, or gallops.  ABDOMEN: Soft, nontender, nondistended. Bowel sounds present. No organomegaly or mass.  EXTREMITIES: No pedal edema, cyanosis, or clubbing. Has left arm AV fistula NEUROLOGIC: Cranial nerves II through XII are intact. Muscle strength 5/5 in all extremities. Sensation intact. Gait not checked. Global weakness Occasional myoclonic jerks of upper and lower extremities noted-sporadic PSYCHIATRIC: The patient is alert and oriented x 2-3  SKIN: No obvious rash, lesion, or ulcer.    LABORATORY PANEL:   CBC  Recent Labs Lab 04/09/16 0248  WBC 8.7  HGB 9.8*  HCT 30.0*  PLT 216   ------------------------------------------------------------------------------------------------------------------  Chemistries   Recent Labs Lab 04/08/16 1032 04/09/16 0248  NA 134* 133*  K 3.8 4.3  CL 97* 92*  CO2 27  31  GLUCOSE 83 119*  BUN 18 23*  CREATININE 4.16* 4.78*  CALCIUM 8.3* 8.9  MG 2.4  --   AST 27 28  ALT 16 16  ALKPHOS 100 112  BILITOT 0.5 0.6    ------------------------------------------------------------------------------------------------------------------  Cardiac Enzymes  Recent Labs Lab 04/09/16 0248  TROPONINI <0.03   ------------------------------------------------------------------------------------------------------------------  RADIOLOGY:  Ct Head Wo Contrast  04/08/2016  CLINICAL DATA:  Altered mental status. EXAM: CT HEAD WITHOUT CONTRAST TECHNIQUE: Contiguous axial images were obtained from the base of the skull through the vertex without intravenous contrast. COMPARISON:  CT scan of April 24, 2015. FINDINGS: Bony calvarium appears intact. No mass effect or midline shift is noted. Ventricular size is within normal limits. There is no evidence of mass lesion, hemorrhage or acute infarction. IMPRESSION: Normal head CT. Electronically Signed   By: Marijo Conception, M.D.   On: 04/08/2016 09:05    EKG:   Orders placed or performed during the hospital encounter of 04/08/16  . ED EKG  . ED EKG  . EKG 12-Lead  . EKG 12-Lead  . EKG 12-Lead  . EKG 12-Lead    ASSESSMENT AND PLAN:   55 year old female with past medical history significant for end-stage renal disease on Monday Wednesday Friday hemodialysis, coronary artery disease, chronic pancreatitis with chronic abdominal pain and nausea vomiting, multiple admissions to the hospital for different somatic complaints, depression and anxiety presents to the hospital this time secondary to agitation and confusion.  #1 acute encephalopathy-acute delirium versus depression -Seems to be cleared up at this time. -CT of the head is negative for any acute findings. No focal neurological symptoms. Could also be underlying hepatic encephalopathy from the underlying cirrhosis. -Appreciate psychiatric consult. Patient's antidepressants are being changed.  #2 hepatic encephalopathy with slightly elevated ammonia level-continue lactulose and Xifaxan. -Recent EGD done for dark  stools showing reflux esophagitis only.  #3 chronic pancreatitis and chronic abdominal pain-continue pain medications and pancreatic supplements  #4 hypertension-continue home medications. On Norvasc, hydralazine  #5 DVT prophylaxis-on subcutaneous heparin  #6 history of diabetes mellitus-no diabetes now. Actually has been having hypoglycemic episodes lately during last admissions. Discontinue pre-meal insulin. Only on sliding scale insulin and sugar checks.   #7 myoclonic jerks-has had extrapyramidal side effects with Reglan in the past. It was restarted during last month admission. Discontinue and monitor after dialysis.  Consider palliative care consult if not improving.   All the records are reviewed and case discussed with Care Management/Social Workerr. Management plans discussed with the patient, family and they are in agreement.  CODE STATUS: Full code  TOTAL TIME TAKING CARE OF THIS PATIENT: 35 minutes.   POSSIBLE D/C IN 2 DAYS, DEPENDING ON CLINICAL CONDITION.   Gladstone Lighter M.D on 04/09/2016 at 3:51 PM  Between 7am to 6pm - Pager - [phone]  After 6pm go to [email or web address] - password [email or web address] [facility]  Office  [phone]  CC: Primary care physician; Ellamae Sia, MD

## 2016-04-09 NOTE — Progress Notes (Signed)
Dialysis started 

## 2016-04-09 NOTE — Progress Notes (Signed)
Post dialysis 

## 2016-04-09 NOTE — Consult Note (Signed)
[facility] Consult   Reason for Consult:  Consult for 55 year old woman with multiple medical problems including depression who presented to the hospital 3 times in the last day with somatic symptoms. Concern about depression. Referring Physician:  Ether Griffins Patient Identification: [name] MRN:  [id number] Principal Diagnosis: Depression, major, recurrent, severe with psychosis ([place]) Diagnosis:   Patient Active Problem List   Diagnosis Date Noted  . Altered mental status [R41.82] 04/08/2016  . Hyperammonemia ([place]) [E72.20] 04/08/2016  . Elevated troponin [R79.89] 04/08/2016  . ESRD (end stage renal disease) ([place]) [N18.6] 04/08/2016  . Depression [F32.9] 04/08/2016  . Depression, major, recurrent, severe with psychosis ([place]) [F33.3] 04/08/2016  . Blood in stool [K92.1]   . Intractable cyclical vomiting with nausea [G43.A1]   . Reflux esophagitis [K21.0]   . Gastritis [K29.70]   . Generalized abdominal pain [R10.84]   . Uncontrollable vomiting [R11.10]   . Major depressive disorder, recurrent episode, moderate ([place]) [F33.1] 03/15/2016  . Adjustment disorder with mixed anxiety and depressed mood [F43.23] 03/15/2016  . Malnutrition of moderate degree [E44.0] 12/01/2015  . Renal mass [N28.89]   . Dyspnea [R06.00]   . Acute renal failure ([place]) [N17.9]   . Respiratory failure ([place]) [J96.90]   . High temperature [R50.9] 11/14/2015  . Pulmonary edema [J81.1]   . Encounter for central line placement [Z45.2]   . Encounter for orogastric (OG) tube placement [Z46.59]   . Nausea [R11.0] 11/12/2015  . Hyperkalemia [E87.5] 10/03/2015  . C. difficile diarrhea [A04.7] 07/22/2015  . Pneumonia [J18.9] 05/21/2015  . Hypoglycemia [E16.2] 04/24/2015  . Unresponsiveness [R40.4] 04/24/2015  . Bradycardia [R00.1] 04/24/2015  . Hypothermia [T68.XXXA] 04/24/2015  . Acute respiratory failure ([place]) [J96.00] 04/24/2015  . Acute diastolic CHF (congestive heart failure) ([place]) [I50.31]  04/05/2015  . Diabetic gastroparesis ([place]) [E11.43, K31.84] 04/05/2015  . Hypokalemia [E87.6] 04/05/2015  . Generalized weakness [R53.1] 04/05/2015  . Acute pulmonary edema ([place]) [J81.0] 04/03/2015  . Nausea and vomiting [R11.2] 04/03/2015  . Hypoglycemia associated with diabetes ([place]) [E11.649] 04/03/2015  . Anemia of chronic disease [D63.8] 04/03/2015  . Secondary hyperparathyroidism ([place]) [N25.81] 04/03/2015  . Pressure ulcer [L89.90] 04/02/2015  . Acute respiratory failure with hypoxia ([place]) [J96.01] 04/01/2015  . Adjustment disorder with anxiety [F43.22] 03/14/2015  . Somatic symptom disorder, mild [F45.8] 03/08/2015  . Coronary artery disease involving native coronary artery of native heart without angina pectoris [I25.10]   . Nausea & vomiting [R11.2] 03/06/2015  . Abdominal pain [R10.9] 03/06/2015  . DM (diabetes mellitus) ([place]) [E11.9] 03/06/2015  . HTN (hypertension) [I10] 03/06/2015  . Gastroparesis [K31.84] 02/24/2015  . Pleural effusion [J90] 02/19/2015  . HCAP (healthcare-associated pneumonia) [J18.9] 02/19/2015  . End-stage renal disease on hemodialysis ([place]) [N18.6, Z99.2] 02/19/2015    Total Time spent with patient: 20 minutes  Subjective:   [name] is a 55 y.o. female patient admitted with "that woman took my children away".  Follow-up for this 9 year old woman with a history of recurrent depression and anxiety. Patient seen today chart reviewed. Patient appears to be much better. She says that she slept well last night. She is not feeling anxious or agitated today. When I saw her today she was smiling and during the time we were talking her son and granddaughter came to visit and she interacted with them very appropriately. She is able to stay on topic and have a lucid conversation.  HPI:  Patient interviewed. Chart reviewed. Labs and vitals reviewed. Patient known from previous hospital stay. 55 year old woman  has a past history of major depression and  recurrent mental health problems. She also has multiple serious chronic medical problems. She presented to the emergency room 3 times within about a 2 day period for physical symptoms including complaints of abdominal pain and mental status changes. Hospitalist evaluating her today felt that the patient's depression was severe. On interview today the patient is a little disorganized. Sometimes it is hard to keep her attention for the interview. She gets tangential and talks about how some woman took her children away from her. When asked who this was she says it is someone who says they are her daughter. Then she starts complaining about other family members. None of the story really makes sense. Patient says that her mood has been upset. She has not been sleeping or eating well. She has been feeling sick. She says she has been compliant with her medicine. Patient talks about hearing songs and voices in her head that are keeping her up all night long. She gets tearful very easily. Denies any suicidal thoughts.  Social history: Patient apparently is living with her son. She has multiple complaints that change from moment to moment about several members of the family. It's a little hard to follow exactly what she is complaining about.  Medical history: Multiple medical problems including end-stage renal disease on dialysis, diabetes hypertension history of coronary artery disease history of congestive heart failure  Past Psychiatric History: Patient is not a very reliable historian. Based on old notes it appears that the patient has had recurrent mental health issues. It is reported that she's had a couple of psychiatric hospitalizations in the past although they were years ago. Not clear if she is ever tried to harm herself. She denies it today. Had been seen by psychiatrists and maintained on antidepressants for years. Family reports that she has had periods of decompensation with confusion mostly when she is  medically sick.  Risk to Self: Is patient at risk for suicide?: No Risk to Others:   Prior Inpatient Therapy:   Prior Outpatient Therapy:    Past Medical History:  Past Medical History  Diagnosis Date  . Asthma   . Collagen vascular disease ([place])   . Diabetes mellitus without complication ([place])   . Hypertension   . Coronary artery disease     a. cath 2013: stenting to RCA (report not available); b. cath 2014: LM nl, pLAD 40%, mLAD nl, ost LCx 40%, mid LCx nl, pRCA 30% @ site of prior stent, mRCA 50%  . Diabetic neuropathy ([place])   . ESRD (end stage renal disease) on dialysis ([place])     M-W-F  . GERD (gastroesophageal reflux disease)   . Hx of pancreatitis 2015  . Myocardial infarction ([place])   . Pneumonia   . Mitral regurgitation     a. echo 10/2013: EF 62%, noWMA, mildly dilated LA, mild to mod MR/TR, GR1DD  . COPD (chronic obstructive pulmonary disease) ([place])   . chronic diastolic CHF 2/97/9892  . dialysis 2006  . Depression   . Anxiety   . Arthritis   . Headache   . Anemia   . Renal insufficiency     Past Surgical History  Procedure Laterality Date  . Cholecystectomy    . Appendectomy    . Abdominal hysterectomy    . Dialysis fistula creation Left     upper arm  . Esophagogastroduodenoscopy N/A 03/08/2015    Procedure: ESOPHAGOGASTRODUODENOSCOPY (EGD);  Surgeon: Manya Silvas, MD;  Location:  [facility];  Service: Endoscopy;  Laterality: N/A;  . Cardiac catheterization Left 07/26/2015    Procedure: Left Heart Cath and Coronary Angiography;  Surgeon: Dionisio David, MD;  Location: [facility];  Service: Cardiovascular;  Laterality: Left;  Marland Kitchen Eye surgery    . Fecal transplant N/A 08/23/2015    Procedure: FECAL TRANSPLANT;  Surgeon: Manya Silvas, MD;  Location: [facility] END[facility]COPY;  Service: Endoscopy;  Laterality: N/A;  . Peripheral vascular catheterization N/A 12/20/2015    Procedure: Thrombectomy of dialysis access versus permcath placement;  Surgeon: Algernon Huxley, MD;  Location: [facility];  Service: Cardiovascular;  Laterality: N/A;  . Peripheral vascular catheterization N/A 12/20/2015    Procedure: A/V Shunt Intervention;  Surgeon: Algernon Huxley, MD;  Location: [facility];  Service: Cardiovascular;  Laterality: N/A;  . Peripheral vascular catheterization N/A 12/20/2015    Procedure: A/V Shuntogram/Fistulagram;  Surgeon: Algernon Huxley, MD;  Location: [facility];  Service: Cardiovascular;  Laterality: N/A;  . Peripheral vascular catheterization N/A 01/02/2016    Procedure: A/V Shuntogram/Fistulagram;  Surgeon: Algernon Huxley, MD;  Location: [facility];  Service: Cardiovascular;  Laterality: N/A;  . Peripheral vascular catheterization N/A 01/02/2016    Procedure: A/V Shunt Intervention;  Surgeon: Algernon Huxley, MD;  Location: [facility];  Service: Cardiovascular;  Laterality: N/A;  . Esophagogastroduodenoscopy (egd) with propofol N/A 03/18/2016    Procedure: ESOPHAGOGASTRODUODENOSCOPY (EGD) WITH PROPOFOL;  Surgeon: [name], MD;  Location: [facility] ENDOSCOPY;  Service: Endoscopy;  Laterality: N/A;   Family History:  Family History  Problem Relation Age of Onset  . Kidney disease Mother   . Diabetes Mother    Family Psychiatric  History: Unknown whether there is any family history Social History:  History  Alcohol Use No     History  Drug Use No    Social History   Social History  . Marital Status: Divorced    Spouse Name: N/A  . Number of Children: N/A  . Years of Education: N/A   Social History Main Topics  . Smoking status: Former Smoker -- 0.50 packs/day    Types: Cigarettes    Quit date: 02/13/2015  . Smokeless tobacco: Never Used  . Alcohol Use: No  . Drug Use: No  . Sexual Activity: Not Asked   Other Topics Concern  . None   Social History Narrative   Additional Social History:    Allergies:   Allergies  Allergen Reactions  . Compazine [Prochlorperazine Edisylate] Anaphylaxis  and Nausea And Vomiting    23 Jul - patient relates that she takes promethazine frequently with no problems.  . Ace Inhibitors Swelling  . Ativan [Lorazepam] Other (See Comments)    Reaction:  Hallucinations and headaches  . Codeine Nausea And Vomiting  . Dilaudid [Hydromorphone Hcl] Other (See Comments)    Delirium  . Gabapentin Other (See Comments)    Reaction:  Unknown   . Losartan Other (See Comments)    Reaction:  Unknown   . Ondansetron Other (See Comments)    Reaction:  Unknown   . Prochlorperazine Other (See Comments)    Reaction:  Unknown   . Scopolamine Other (See Comments)    Reaction:  Unknown   . Zofran [Ondansetron Hcl] Other (See Comments)    Reaction:  hallucinations   . Oxycodone Anxiety  . Tape Rash    Labs:  Results for orders placed or performed during the hospital encounter  of 04/08/16 (from the past 48 hour(s))  Blood culture (routine x 2)     Status: None (Preliminary result)   Collection Time: 04/08/16  8:06 AM  Result Value Ref Range   Specimen Description BLOOD    Special Requests BOTTLES DRAWN AEROBIC AND ANAEROBIC [place]    Culture NO GROWTH 1 DAY    Report Status PENDING   Hepatic function panel     Status: Abnormal   Collection Time: 04/08/16  9:06 AM  Result Value Ref Range   Total Protein 7.7 6.5 - 8.1 g/dL   Albumin 2.9 (L) 3.5 - 5.0 g/dL   AST 36 15 - 41 U/L   ALT 15 14 - 54 U/L   Alkaline Phosphatase 93 38 - 126 U/L   Total Bilirubin 0.5 0.3 - 1.2 mg/dL   Bilirubin, Direct 0.2 0.1 - 0.5 mg/dL   Indirect Bilirubin 0.3 0.3 - 0.9 mg/dL  Hemoglobin A1c     Status: None   Collection Time: 04/08/16  9:06 AM  Result Value Ref Range   Hgb A1c MFr Bld 5.1 4.0 - 6.0 %  CBC     Status: Abnormal   Collection Time: 04/08/16  9:42 AM  Result Value Ref Range   WBC 6.7 3.6 - 11.0 K/uL   RBC 3.94 3.80 - 5.20 MIL/uL   Hemoglobin 9.5 (L) 12.0 - 16.0 g/dL   HCT 29.6 (L) 35.0 - 47.0 %   MCV 75.0 (L) 80.0 - 100.0 fL   MCH 24.1 (L) 26.0 -  34.0 pg   MCHC 32.1 32.0 - 36.0 g/dL   RDW 22.3 (H) 11.5 - 14.5 %   Platelets 205 150 - 440 K/uL  Troponin I     Status: Abnormal   Collection Time: 04/08/16 10:32 AM  Result Value Ref Range   Troponin I 0.04 (HH) <0.03 ng/mL    Comment: CRITICAL RESULT CALLED TO, READ BACK BY AND VERIFIED WITH  DR. NORMAN AT 2876 04/08/16 SDR   Comprehensive metabolic panel     Status: Abnormal   Collection Time: 04/08/16 10:32 AM  Result Value Ref Range   Sodium 134 (L) 135 - 145 mmol/L   Potassium 3.8 3.5 - 5.1 mmol/L   Chloride 97 (L) 101 - 111 mmol/L   CO2 27 22 - 32 mmol/L   Glucose, Bld 83 65 - 99 mg/dL   BUN 18 6 - 20 mg/dL   Creatinine, Ser 4.16 (H) 0.44 - 1.00 mg/dL   Calcium 8.3 (L) 8.9 - 10.3 mg/dL   Total Protein 8.0 6.5 - 8.1 g/dL   Albumin 3.1 (L) 3.5 - 5.0 g/dL   AST 27 15 - 41 U/L   ALT 16 14 - 54 U/L   Alkaline Phosphatase 100 38 - 126 U/L   Total Bilirubin 0.5 0.3 - 1.2 mg/dL   GFR calc non Af Amer 11 (L) >60 mL/min   GFR calc Af Amer 13 (L) >60 mL/min    Comment: (NOTE) The eGFR has been calculated using the CKD EPI equation. This calculation has not been validated in all clinical situations. eGFR's persistently <60 mL/min signify possible Chronic Kidney Disease.    Anion gap 10 5 - 15  Magnesium     Status: None   Collection Time: 04/08/16 10:32 AM  Result Value Ref Range   Magnesium 2.4 1.7 - 2.4 mg/dL  Ammonia     Status: Abnormal   Collection Time: 04/08/16 10:33 AM  Result Value Ref Range   Ammonia  46 (H) 9 - 35 umol/L  Troponin I     Status: None   Collection Time: 04/08/16  3:02 PM  Result Value Ref Range   Troponin I <0.03 <0.03 ng/mL  Glucose, capillary     Status: Abnormal   Collection Time: 04/08/16  4:19 PM  Result Value Ref Range   Glucose-Capillary 219 (H) 65 - 99 mg/dL  Glucose, capillary     Status: Abnormal   Collection Time: 04/08/16  5:28 PM  Result Value Ref Range   Glucose-Capillary 193 (H) 65 - 99 mg/dL  Glucose, capillary     Status:  Abnormal   Collection Time: 04/08/16  8:46 PM  Result Value Ref Range   Glucose-Capillary 103 (H) 65 - 99 mg/dL   Comment 1 Notify RN   Troponin I     Status: None   Collection Time: 04/09/16  2:48 AM  Result Value Ref Range   Troponin I <0.03 <0.03 ng/mL  Comprehensive metabolic panel     Status: Abnormal   Collection Time: 04/09/16  2:48 AM  Result Value Ref Range   Sodium 133 (L) 135 - 145 mmol/L   Potassium 4.3 3.5 - 5.1 mmol/L   Chloride 92 (L) 101 - 111 mmol/L   CO2 31 22 - 32 mmol/L   Glucose, Bld 119 (H) 65 - 99 mg/dL   BUN 23 (H) 6 - 20 mg/dL   Creatinine, Ser 4.78 (H) 0.44 - 1.00 mg/dL   Calcium 8.9 8.9 - 10.3 mg/dL   Total Protein 9.3 (H) 6.5 - 8.1 g/dL   Albumin 3.5 3.5 - 5.0 g/dL   AST 28 15 - 41 U/L   ALT 16 14 - 54 U/L   Alkaline Phosphatase 112 38 - 126 U/L   Total Bilirubin 0.6 0.3 - 1.2 mg/dL   GFR calc non Af Amer 9 (L) >60 mL/min   GFR calc Af Amer 11 (L) >60 mL/min    Comment: (NOTE) The eGFR has been calculated using the CKD EPI equation. This calculation has not been validated in all clinical situations. eGFR's persistently <60 mL/min signify possible Chronic Kidney Disease.    Anion gap 10 5 - 15  CBC     Status: Abnormal   Collection Time: 04/09/16  2:48 AM  Result Value Ref Range   WBC 8.7 3.6 - 11.0 K/uL   RBC 3.97 3.80 - 5.20 MIL/uL   Hemoglobin 9.8 (L) 12.0 - 16.0 g/dL   HCT 30.0 (L) 35.0 - 47.0 %   MCV 75.4 (L) 80.0 - 100.0 fL   MCH 24.6 (L) 26.0 - 34.0 pg   MCHC 32.6 32.0 - 36.0 g/dL   RDW 21.6 (H) 11.5 - 14.5 %   Platelets 216 150 - 440 K/uL  Glucose, capillary     Status: Abnormal   Collection Time: 04/09/16  7:49 AM  Result Value Ref Range   Glucose-Capillary 48 (L) 65 - 99 mg/dL  Glucose, capillary     Status: Abnormal   Collection Time: 04/09/16  8:12 AM  Result Value Ref Range   Glucose-Capillary 57 (L) 65 - 99 mg/dL  Glucose, capillary     Status: None   Collection Time: 04/09/16  8:37 AM  Result Value Ref Range    Glucose-Capillary 90 65 - 99 mg/dL  Glucose, capillary     Status: None   Collection Time: 04/09/16 11:31 AM  Result Value Ref Range   Glucose-Capillary 98 65 - 99 mg/dL    Current Facility-Administered  Medications  Medication Dose Route Frequency Provider Last Rate Last Dose  . 0.9 %  sodium chloride infusion  250 mL Intravenous PRN Theodoro Grist, MD      . acidophilus (RISAQUAD) capsule 1 capsule  1 capsule Oral Daily Theodoro Grist, MD   Stopped at 04/09/16 1038  . albuterol (PROVENTIL) (2.5 MG/3ML) 0.083% nebulizer solution 2.5 mg  2.5 mg Nebulization Q4H PRN Theodoro Grist, MD      . amLODipine (NORVASC) tablet 10 mg  10 mg Oral Daily Theodoro Grist, MD   Stopped at 04/09/16 1038  . aspirin EC tablet 81 mg  81 mg Oral Hulen Skains, MD   81 mg at 04/09/16 0555  . atorvastatin (LIPITOR) tablet 20 mg  20 mg Oral QPM Theodoro Grist, MD   20 mg at 04/08/16 1740  . clopidogrel (PLAVIX) tablet 75 mg  75 mg Oral Daily Theodoro Grist, MD   Stopped at 04/09/16 1039  . famotidine (PEPCID) tablet 20 mg  20 mg Oral Daily Theodoro Grist, MD   Stopped at 04/09/16 1039  . FLUoxetine (PROZAC) capsule 20 mg  20 mg Oral Daily Gonzella Lex, MD   Stopped at 04/09/16 1039  . haloperidol lactate (HALDOL) injection 1 mg  1 mg Intravenous Q4H PRN Theodoro Grist, MD      . heparin injection 5,000 Units  5,000 Units Subcutaneous Q8H Theodoro Grist, MD   5,000 Units at 04/08/16 1451  . hydrALAZINE (APRESOLINE) tablet 25 mg  25 mg Oral TID Theodoro Grist, MD   Stopped at 04/09/16 1040  . HYDROcodone-acetaminophen (NORCO/VICODIN) 5-325 MG per tablet 1 tablet  1 tablet Oral Q6H PRN Gladstone Lighter, MD      . insulin aspart (novoLOG) injection 0-9 Units  0-9 Units Subcutaneous TID WC Theodoro Grist, MD   2 Units at 04/08/16 1741  . lactulose (CHRONULAC) 10 GM/15ML solution 30 g  30 g Oral BID PRN Theodoro Grist, MD      . lipase/protease/amylase (CREON) capsule 24,000 Units  24,000 Units Oral Q lunch Theodoro Grist, MD    24,000 Units at 04/09/16 1220  . lipase/protease/amylase (CREON) capsule 36,000 Units  36,000 Units Oral BID Theodoro Grist, MD   36,000 Units at 04/09/16 1540  . loratadine (CLARITIN) tablet 10 mg  10 mg Oral Daily Theodoro Grist, MD   Stopped at 04/09/16 1040  . multivitamin (RENA-VIT) tablet 1 tablet  1 tablet Oral Daily Theodoro Grist, MD   Stopped at 04/09/16 1040  . OLANZapine (ZYPREXA) tablet 2.5 mg  2.5 mg Oral QHS Gonzella Lex, MD   2.5 mg at 04/08/16 2133  . omega-3 acid ethyl esters (LOVAZA) capsule 1 g  1 g Oral Daily Theodoro Grist, MD   Stopped at 04/09/16 1040  . pantoprazole (PROTONIX) EC tablet 40 mg  40 mg Oral BID AC Theodoro Grist, MD   40 mg at 04/09/16 0834  . pregabalin (LYRICA) capsule 25 mg  25 mg Oral Daily Theodoro Grist, MD   Stopped at 04/09/16 1040  . promethazine (PHENERGAN) tablet 12.5 mg  12.5 mg Oral Q8H PRN Theodoro Grist, MD      . ranolazine (RANEXA) 12 hr tablet 1,000 mg  1,000 mg Oral BID Theodoro Grist, MD   Stopped at 04/09/16 1041  . rifaximin (XIFAXAN) tablet 550 mg  550 mg Oral BID Theodoro Grist, MD   Stopped at 04/09/16 1041  . sevelamer carbonate (RENVELA) tablet 800 mg  800 mg Oral TID WC Theodoro Grist, MD  800 mg at 04/09/16 1218  . sodium chloride flush (NS) 0.9 % injection 3 mL  3 mL Intravenous Q12H Theodoro Grist, MD   3 mL at 04/08/16 1430  . sodium chloride flush (NS) 0.9 % injection 3 mL  3 mL Intravenous Q12H Theodoro Grist, MD   3 mL at 04/09/16 1041  . sodium chloride flush (NS) 0.9 % injection 3 mL  3 mL Intravenous PRN Theodoro Grist, MD      . vitamin B-12 (CYANOCOBALAMIN) tablet 1,000 mcg  1,000 mcg Oral Daily Theodoro Grist, MD   Stopped at 04/09/16 1041    Musculoskeletal: Strength & Muscle Tone: decreased and atrophy Gait & Station: unsteady Patient leans: N/A  Psychiatric Specialty Exam: Physical Exam  Nursing note and vitals reviewed. Constitutional: She appears well-developed. No distress.  HENT:  Head: Normocephalic and  atraumatic.  Eyes: Conjunctivae are normal. Pupils are equal, round, and reactive to light.  Neck: Normal range of motion.  Cardiovascular: Normal heart sounds.   Respiratory: No respiratory distress.  GI: Soft.  Musculoskeletal: Normal range of motion.  Neurological: She is alert.  Skin: Skin is warm and dry.  Psychiatric: Her mood appears not anxious. Her affect is not labile. Her speech is not delayed and not tangential. She is not withdrawn. Thought content is not paranoid and not delusional. Cognition and memory are impaired. She does not express impulsivity. She expresses no suicidal ideation. She is attentive.    Review of Systems  Constitutional: Negative.   HENT: Negative.   Eyes: Negative.   Respiratory: Negative for shortness of breath.   Cardiovascular: Positive for leg swelling.  Gastrointestinal: Negative for nausea, vomiting and abdominal pain.  Musculoskeletal: Negative.   Skin: Negative.   Neurological: Negative.   Psychiatric/Behavioral: Negative for depression, suicidal ideas, hallucinations, memory loss and substance abuse. The patient is nervous/anxious. The patient does not have insomnia.     Blood pressure 146/57, pulse 86, temperature 98.1 F (36.7 C), temperature source Oral, resp. rate 16, height 5' 5"  (1.651 m), weight 68.7 kg (151 lb 7.3 oz), SpO2 100 %.Body mass index is 25.2 kg/(m^2).  General Appearance: Casual  Eye Contact:  Minimal  Speech:  Blocked and Garbled  Volume:  Decreased  Mood:  Euthymic  Affect:  Labile  Thought Process:  Coherent  Orientation:  Full (Time, Place, and Person)  Thought Content:  Logical  Suicidal Thoughts:  No  Homicidal Thoughts:  No  Memory:  Immediate;   Good Recent;   Fair Remote;   Fair  Judgement:  Impaired  Insight:  Shallow  Psychomotor Activity:  Decreased  Concentration:  Concentration: Poor  Recall:  Poor  Fund of Knowledge:  Poor  Language:  Fair  Akathisia:  No  Handed:  Right  AIMS (if  indicated):     Assets:  Housing Social Support  ADL's:  Impaired  Cognition:  Impaired,  Mild  Sleep:        Treatment Plan Summary: Daily contact with patient to assess and evaluate symptoms and progress in treatment, Medication management and Plan Patient's affect is much brighter and more appropriate. Thoughts appear to be much more clear. No complaints of any side effects from changes in medicine. Supportive counseling. Continue changed medication with Prozac and very low dose of Zyprexa.  Disposition: Supportive therapy provided about ongoing stressors. Discussed crisis plan, support from social network, calling 911, coming to the Emergency Department, and calling Suicide Hotline.  Alethia Berthold, MD 04/09/2016 5:59 PM

## 2016-04-09 NOTE — Progress Notes (Signed)
Subjective:  Patient known to our practice from outpatient dialysis. She was admitted for an episode of confusion and aggressive behavior. She was evaluated by psychiatry.  This morning she is sitting up in the bed eating breakfast. She appears to be in her usual state of health.  No nausea or vomiting reported this morning.  Patient does not remember her behavior from yesterday.   Objective:  Vital signs in last 24 hours:  Temp:  [97.7 F (36.5 C)-98.3 F (36.8 C)] 98.2 F (36.8 C) (07/19 0558) Pulse Rate:  [66-74] 74 (07/19 0558) Resp:  [13-27] 17 (07/19 0558) BP: (132-149)/(41-71) 133/52 mmHg (07/19 0558) SpO2:  [93 %-100 %] 100 % (07/19 0558) Weight:  [68.312 kg (150 lb 9.6 oz)-68.402 kg (150 lb 12.8 oz)] 68.312 kg (150 lb 9.6 oz) (07/19 0500)  Weight change:  Filed Weights   04/08/16 1756 04/09/16 0500  Weight: 68.402 kg (150 lb 12.8 oz) 68.312 kg (150 lb 9.6 oz)    Intake/Output:    Intake/Output Summary (Last 24 hours) at 04/09/16 1035 Last data filed at 04/09/16 0900  Gross per 24 hour  Intake    543 ml  Output      0 ml  Net    543 ml     Physical Exam: General: Appears comfortable and in no acute distress. Laying in bed.  HEENT Anicteric. Slight hearing impairment.   Neck Supple  Pulm/lungs Clear to auscultation bilaterally.  CVS/Heart Soft systolic murmur. Regular rhythm.   Abdomen:  Soft, non-tender.  Extremities: No peripheral edema.  Neurologic: Alert and able to answer questions appropriately.  Skin: No rashes.  Access: AV fistula.        Basic Metabolic Panel:   Recent Labs Lab 04/04/16 0744 04/07/16 0146 04/07/16 1330 04/08/16 1032 04/09/16 0248  NA 131* 128* 135 134* 133*  K 3.9 5.5* 3.2* 3.8 4.3  CL 91* 88* 94* 97* 92*  CO2 31 28 33* 27 31  GLUCOSE 66 81 44* 83 119*  BUN 17 35* 11 18 23*  CREATININE 4.16* 6.33* 2.78* 4.16* 4.78*  CALCIUM 8.8* 8.5* 8.0* 8.3* 8.9  MG  --   --  2.2 2.4  --      CBC:  Recent Labs Lab  04/04/16 0744 04/07/16 0146 04/07/16 1330 04/08/16 0942 04/09/16 0248  WBC 5.0 5.7 3.2* 6.7 8.7  NEUTROABS  --   --  1.6  --   --   HGB 9.6* 9.4* 9.2* 9.5* 9.8*  HCT 29.3* 28.0* 28.7* 29.6* 30.0*  MCV 75.6* 74.3* 75.1* 75.0* 75.4*  PLT 208 211 197 205 216      Microbiology:  Recent Results (from the past 720 hour(s))  Gastrointestinal Panel by PCR , Stool     Status: None   Collection Time: 03/16/16  2:25 PM  Result Value Ref Range Status   Campylobacter species NOT DETECTED NOT DETECTED Final   Plesimonas shigelloides NOT DETECTED NOT DETECTED Final   Salmonella species NOT DETECTED NOT DETECTED Final   Yersinia enterocolitica NOT DETECTED NOT DETECTED Final   Vibrio species NOT DETECTED NOT DETECTED Final   Vibrio cholerae NOT DETECTED NOT DETECTED Final   Enteroaggregative E coli (EAEC) NOT DETECTED NOT DETECTED Final   Enteropathogenic E coli (EPEC) NOT DETECTED NOT DETECTED Final   Enterotoxigenic E coli (ETEC) NOT DETECTED NOT DETECTED Final   Shiga like toxin producing E coli (STEC) NOT DETECTED NOT DETECTED Final   E. coli O157 NOT DETECTED NOT DETECTED Final  Shigella/Enteroinvasive E coli (EIEC) NOT DETECTED NOT DETECTED Final   Cryptosporidium NOT DETECTED NOT DETECTED Final   Cyclospora cayetanensis NOT DETECTED NOT DETECTED Final   Entamoeba histolytica NOT DETECTED NOT DETECTED Final   Giardia lamblia NOT DETECTED NOT DETECTED Final   Adenovirus F40/41 NOT DETECTED NOT DETECTED Final   Astrovirus NOT DETECTED NOT DETECTED Final   Norovirus GI/GII NOT DETECTED NOT DETECTED Final   Rotavirus A NOT DETECTED NOT DETECTED Final   Sapovirus (I, II, IV, and V) NOT DETECTED NOT DETECTED Final  C difficile quick scan w PCR reflex     Status: Abnormal   Collection Time: 03/16/16  2:25 PM  Result Value Ref Range Status   C Diff antigen POSITIVE (A) NEGATIVE Final   C Diff toxin NEGATIVE NEGATIVE Final   C Diff interpretation   Final    Positive for toxigenic C.  difficile, active toxin production not detected. Patient has toxigenic C. difficile organisms present in the bowel, but toxin was not detected. The patient may be a carrier or the level of toxin in the sample was below the limit  of detection. This information should be used in conjunction with the patient's clinical history when deciding on possible therapy.     Comment: CRITICAL RESULT CALLED TO, READ BACK BY AND VERIFIED WITH: Daleen Snook @ [id number] 03/16/16 by [facility]   Clostridium Difficile by PCR     Status: Abnormal   Collection Time: 03/16/16  2:25 PM  Result Value Ref Range Status   Toxigenic C Difficile by pcr POSITIVE (A) NEGATIVE Final    Comment: CRITICAL RESULT CALLED TO, READ BACK BY AND VERIFIED WITH: Daleen Snook @ [id number] 03/16/16 by [facility]   MRSA PCR Screening     Status: None   Collection Time: 03/18/16 10:46 AM  Result Value Ref Range Status   MRSA by PCR NEGATIVE NEGATIVE Final    Comment:        The GeneXpert MRSA Assay (FDA approved for NASAL specimens only), is one component of a comprehensive MRSA colonization surveillance program. It is not intended to diagnose MRSA infection nor to guide or monitor treatment for MRSA infections.     Coagulation Studies: No results for input(s): LABPROT, INR in the last 72 hours.  Urinalysis: No results for input(s): COLORURINE, LABSPEC, PHURINE, GLUCOSEU, HGBUR, BILIRUBINUR, KETONESUR, PROTEINUR, UROBILINOGEN, NITRITE, LEUKOCYTESUR in the last 72 hours.  Invalid input(s): APPERANCEUR    Imaging: Ct Head Wo Contrast  04/08/2016  CLINICAL DATA:  Altered mental status. EXAM: CT HEAD WITHOUT CONTRAST TECHNIQUE: Contiguous axial images were obtained from the base of the skull through the vertex without intravenous contrast. COMPARISON:  CT scan of April 24, 2015. FINDINGS: Bony calvarium appears intact. No mass effect or midline shift is noted. Ventricular size is within normal limits. There is no evidence of mass lesion,  hemorrhage or acute infarction. IMPRESSION: Normal head CT. Electronically Signed   By: Marijo Conception, M.D.   On: 04/08/2016 09:05   Dg Chest Portable 1 View  04/07/2016  CLINICAL DATA:  Altered mental status post dialysis today EXAM: PORTABLE CHEST 1 VIEW COMPARISON:  03/27/2016 FINDINGS: Cardiomegaly is noted. Mild interstitial prominence bilateral without convincing pulmonary edema. Bilateral small pleural effusion again noted. Worsening atelectasis or infiltrate in right middle lobe. IMPRESSION: Bilateral small pleural effusion again noted. No convincing pulmonary edema. Worsening atelectasis or infiltrate in right middle lobe. Electronically Signed   By: Lahoma Crocker M.D.   On: 04/07/2016 14:22  Medications:     . acidophilus  1 capsule Oral Daily  . amLODipine  10 mg Oral Daily  . aspirin EC  81 mg Oral BH-[id number]  . atorvastatin  20 mg Oral QPM  . clopidogrel  75 mg Oral Daily  . famotidine  20 mg Oral Daily  . FLUoxetine  20 mg Oral Daily  . heparin  5,000 Units Subcutaneous Q8H  . hydrALAZINE  25 mg Oral TID  . insulin aspart  0-9 Units Subcutaneous TID WC  . insulin aspart  3 Units Subcutaneous TID WC  . lipase/protease/amylase  24,000 Units Oral Q lunch  . lipase/protease/amylase  36,000 Units Oral BID  . loratadine  10 mg Oral Daily  . metoCLOPramide  5 mg Oral QID  . multivitamin  1 tablet Oral Daily  . OLANZapine  2.5 mg Oral QHS  . omega-3 acid ethyl esters  1 g Oral Daily  . pantoprazole  40 mg Oral BID AC  . pregabalin  25 mg Oral Daily  . ranolazine  1,000 mg Oral BID  . rifaximin  550 mg Oral BID  . sevelamer carbonate  800 mg Oral TID WC  . sodium chloride flush  3 mL Intravenous Q12H  . sodium chloride flush  3 mL Intravenous Q12H  . vitamin B-12  1,000 mcg Oral Daily   sodium chloride, albuterol, haloperidol lactate, HYDROcodone-acetaminophen, lactulose, promethazine, sodium chloride flush  Assessment/ Plan:  55 y.o. female with insulin dependent diabetes,  hypertension, hyperlipidemia, diabetic retinopathy, diabetic neuropathy, diabetic gastroparesis, pancreatic insufficiency, depression, GERD. Renal masses - avoiding epo   MWF [facility] [address]   1. End Stage Renal Disease - Continue MWF schedule.  - Dialysis planned for today.  2. Anemia of chronic kidney disease: hemoglobin 9.8 Not getting erythropoeitin stimulating agent as outpatient due to renal masses. However CT from 6/24 does not seem to show any renal masses.   3. Secondary Hyperparathyroidism: will monitor phosphorus.  - Continue sevelamer    LOS: 1 Sina Sumpter 7/19/201710:35 AM

## 2016-04-09 NOTE — Progress Notes (Signed)
Hypoglycemic Event  CBG: 48  Treatment: two 4 oz cup of Orange Juice, twice  Symptoms: asymptomatic   Follow-up CBG: Time: At 0812 CBG-57, rechecked at 0837, CBG was-90  Possible Reasons for Event: poor diet/fluid intake  Comments/MD notified: MD was not notified due to hypoglycemic protocol worked, CBG increased    Angus Seller

## 2016-04-10 LAB — URINALYSIS COMPLETE WITH MICROSCOPIC (ARMC ONLY)
Bacteria, UA: NONE SEEN
Bilirubin Urine: NEGATIVE
Glucose, UA: NEGATIVE mg/dL
Hgb urine dipstick: NEGATIVE
Ketones, ur: NEGATIVE mg/dL
Leukocytes, UA: NEGATIVE
Nitrite: NEGATIVE
Protein, ur: 100 mg/dL — AB
Specific Gravity, Urine: 1.009 (ref 1.005–1.030)
Squamous Epithelial / LPF: NONE SEEN
pH: 9 — ABNORMAL HIGH (ref 5.0–8.0)

## 2016-04-10 LAB — GLUCOSE, CAPILLARY
Glucose-Capillary: 81 mg/dL (ref 65–99)
Glucose-Capillary: 99 mg/dL (ref 65–99)
Glucose-Capillary: 115 mg/dL — ABNORMAL HIGH (ref 65–99)
Glucose-Capillary: 143 mg/dL — ABNORMAL HIGH (ref 65–99)

## 2016-04-10 MED ORDER — FLUOXETINE HCL 20 MG PO CAPS: 20.0000 mg | ORAL_CAPSULE | Freq: Every day | ORAL | Status: DC

## 2016-04-10 MED ORDER — RIFAXIMIN 550 MG PO TABS: 550.0000 mg | ORAL_TABLET | Freq: Two times a day (BID) | ORAL | Status: DC

## 2016-04-10 MED ORDER — OLANZAPINE 2.5 MG PO TABS: 2.5000 mg | ORAL_TABLET | Freq: Every day | ORAL | Status: DC

## 2016-04-10 NOTE — Evaluation (Signed)
Physical Therapy Evaluation Patient Details Name: [name] MRN: [id number] DOB: 11-29-1960 Today's Date: 04/10/2016   History of Present Illness  presented to ER secondary to AMS, aggressive behavior; admitted with AMS secondary to metabolic encephalopathy.  Of note, multiple recent presentations to ER secondary to nausea/vomiting, abdominal pain and hyperglycemia.  Clinical Impression  Upon evaluation, patient alert and oriented to basic information.  Bilat UE/LE strength and ROM grossly symmetrical and WFL for basic transfers and mobility.  Denies pain at this time. Able to complete bed mobility with mod indep; sit/stand, basic transfers and gait (50') with RW, cga.  No buckling or LOB noted; intermittent cuing for safety with walker management.  Mild higher-level balance deficits noted (anticipate need for sup/assist with stairs), but patient with fair use of compensatory strategies. Would benefit from skilled PT to address above deficits and promote optimal return to PLOF; Recommend transition to [place] upon discharge from acute hospitalization.  SaO2 on room air at rest = 85% SaO2 on 2L O2 at rest = 98% SaO2 on 2 liters of O2 while ambulating = 95%     Follow Up Recommendations Home health PT    Equipment Recommendations       Recommendations for Other Services       Precautions / Restrictions Precautions Precautions: Fall Precaution Comments: L UE AV graft; enteric isolation Restrictions Weight Bearing Restrictions: No      Mobility  Bed Mobility Overal bed mobility: Modified Independent Bed Mobility: Supine to Sit     Supine to sit: Modified independent (Device/Increase time)        Transfers Overall transfer level: Needs assistance Equipment used: Rolling walker (2 wheeled) Transfers: Sit to/from Stand Sit to Stand: Min guard         General transfer comment: requires UE support, increased time to complete  Ambulation/Gait Ambulation/Gait assistance:  Min guard Ambulation Distance (Feet): 50 Feet Assistive device: Rolling walker (2 wheeled)       General Gait Details: reciprocal stepping with fair cadence; no buckling or LOB.  Maintains sats >94% with exertion on 2L supplemental O2  Stairs            Wheelchair Mobility    Modified Rankin (Stroke Patients Only)       Balance Overall balance assessment: Needs assistance Sitting-balance support: No upper extremity supported;Feet supported Sitting balance-Leahy Scale: Normal     Standing balance support: Bilateral upper extremity supported Standing balance-Leahy Scale: Good                               Pertinent Vitals/Pain Pain Assessment: No/denies pain    Home Living Family/patient expects to be discharged to:: Private residence Living Arrangements: Children Available Help at Discharge: Family;Available PRN/intermittently Type of Home: House Home Access: Stairs to enter Entrance Stairs-Rails: Right;Left Entrance Stairs-Number of Steps: 4 Home Layout: Two level;Able to live on main level with bedroom/bathroom Home Equipment: Wheelchair - [employer] - 2 wheels;Cane - single point;Bedside commode      Prior Function     Gait / Transfers Assistance Needed: Ambulates with RW.     Comments: Festus Aloe (via manual w/c) to HD 3x/week     Hand Dominance        Extremity/Trunk Assessment   Upper Extremity Assessment: Overall WFL for tasks assessed           Lower Extremity Assessment: Overall WFL for tasks assessed (grossly at least 4+/5 throughout)  Communication   Communication: HOH  Cognition Arousal/Alertness: Awake/alert Behavior During Therapy: WFL for tasks assessed/performed Overall Cognitive Status: Within Functional Limits for tasks assessed                      General Comments      Exercises        Assessment/Plan    PT Assessment Patient needs continued PT services  PT Diagnosis  Generalized weakness   PT Problem List Decreased activity tolerance;Decreased balance;Decreased mobility;Cardiopulmonary status limiting activity  PT Treatment Interventions DME instruction;Gait training;Stair training;Functional mobility training;Therapeutic activities;Therapeutic exercise;Balance training;Patient/family education   PT Goals (Current goals can be found in the Care Plan section) Acute Rehab PT Goals Patient Stated Goal: to go home PT Goal Formulation: With patient Time For Goal Achievement: 04/24/16    Frequency Min 2X/week   Barriers to discharge        Co-evaluation               End of Session Equipment Utilized During Treatment: Gait belt;Oxygen Activity Tolerance: Patient tolerated treatment well Patient left: in chair;with call bell/phone within reach;with chair alarm set Nurse Communication: Mobility status;Precautions         Time: [id number] PT Time Calculation (min) (ACUTE ONLY): 27 min   Charges:   PT Evaluation $PT Eval Low Complexity: 1 Procedure     PT G Codes:        Sharmeka Palmisano H. Owens Shark, PT, DPT, NCS 04/10/2016, 4:21 PM [phone]

## 2016-04-10 NOTE — Discharge Summary (Addendum)
[facility] [place] NAME: [name]    MR#:  [id number]  DATE OF BIRTH:  [name], 1961  DATE OF ADMISSION:  04/08/2016 ADMITTING PHYSICIAN: Theodoro Grist, MD  DATE OF DISCHARGE: 04/10/2016  PRIMARY CARE PHYSICIAN: Ellamae Sia, MD    ADMISSION DIAGNOSIS:  Hyperammonemia ([place]) [E72.20] Elevated troponin [R79.89] Altered mental status, unspecified altered mental status type [R41.82]  DISCHARGE DIAGNOSIS:  Principal Problem: Metabolic encephalopathy   Depression, major, recurrent, severe with psychosis ([place]) Active Problems:   Altered mental status   Hyperammonemia (HCC)   Elevated troponin   ESRD (end stage renal disease) ([place])   Depression   SECONDARY DIAGNOSIS:   Past Medical History  Diagnosis Date  . Asthma   . Collagen vascular disease ([place])   . Diabetes mellitus without complication ([place])   . Hypertension   . Coronary artery disease     a. cath 2013: stenting to RCA (report not available); b. cath 2014: LM nl, pLAD 40%, mLAD nl, ost LCx 40%, mid LCx nl, pRCA 30% @ site of prior stent, mRCA 50%  . Diabetic neuropathy ([place])   . ESRD (end stage renal disease) on dialysis ([place])     M-W-F  . GERD (gastroesophageal reflux disease)   . Hx of pancreatitis 2015  . Myocardial infarction ([place])   . Pneumonia   . Mitral regurgitation     a. echo 10/2013: EF 62%, noWMA, mildly dilated LA, mild to mod MR/TR, GR1DD  . COPD (chronic obstructive pulmonary disease) ([place])   . chronic diastolic CHF [id number]  . dialysis 2006  . Depression   . Anxiety   . Arthritis   . Headache   . Anemia   . Renal insufficiency     HOSPITAL COURSE:  [name]  is a 55 y.o. female admitted 04/08/2016 with chief complaint Altered Mental Status . Please see H&P performed by Theodoro Grist, MD for further information. Patient presented with the above symptoms. Metabolic encephalopathy believed to be multifactorial including hepatic encephalopathy,  medication, and psychiatric. She was started on lactulose and xifaximin as well as evaluted by psych. Psychiatry changed her medications. Between these 2 modalities her mental status improved to baseline.  DISCHARGE CONDITIONS:   stable  CONSULTS OBTAINED:  Treatment Team:  Gonzella Lex, MD Yolonda Kida, MD Murlean Iba, MD  DRUG ALLERGIES:   Allergies  Allergen Reactions  . Compazine [Prochlorperazine Edisylate] Anaphylaxis and Nausea And Vomiting    23 Jul - patient relates that she takes promethazine frequently with no problems.  . Ace Inhibitors Swelling  . Ativan [Lorazepam] Other (See Comments)    Reaction:  Hallucinations and headaches  . Codeine Nausea And Vomiting  . Dilaudid [Hydromorphone Hcl] Other (See Comments)    Delirium  . Gabapentin Other (See Comments)    Reaction:  Unknown   . Losartan Other (See Comments)    Reaction:  Unknown   . Ondansetron Other (See Comments)    Reaction:  Unknown   . Prochlorperazine Other (See Comments)    Reaction:  Unknown   . Scopolamine Other (See Comments)    Reaction:  Unknown   . Zofran [Ondansetron Hcl] Other (See Comments)    Reaction:  hallucinations   . Oxycodone Anxiety  . Tape Rash    DISCHARGE MEDICATIONS:   Current Discharge Medication List    START taking these medications   Details  FLUoxetine (PROZAC) 20 MG capsule Take 1 capsule (20 mg total)  by mouth daily. Qty: 30 capsule, Refills: 0    OLANZapine (ZYPREXA) 2.5 MG tablet Take 1 tablet (2.5 mg total) by mouth at bedtime. Qty: 30 tablet, Refills: 0    rifaximin (XIFAXAN) 550 MG TABS tablet Take 1 tablet (550 mg total) by mouth 2 (two) times daily. Qty: 60 tablet, Refills: 0      CONTINUE these medications which have NOT CHANGED   Details  acidophilus (RISAQUAD) CAPS capsule Take 1 capsule by mouth daily.    albuterol (PROVENTIL HFA;VENTOLIN HFA) 108 (90 Base) MCG/ACT inhaler Inhale 2 puffs into the lungs every 4 (four) hours as needed  for wheezing or shortness of breath.    albuterol (PROVENTIL) (2.5 MG/3ML) 0.083% nebulizer solution Take 2.5 mg by nebulization every 4 (four) hours as needed for wheezing or shortness of breath.    amLODipine (NORVASC) 10 MG tablet Take 10 mg by mouth daily.    aspirin EC 81 MG tablet Take 81 mg by mouth every morning.     atorvastatin (LIPITOR) 20 MG tablet Take 20 mg by mouth every evening.    cetirizine (ZYRTEC) 10 MG tablet Take 10 mg by mouth daily.     clopidogrel (PLAVIX) 75 MG tablet Take 75 mg by mouth daily. Reported on 12/17/2015    glucose 4 GM chewable tablet Chew 3-4 tablets by mouth as needed for low blood sugar.    hydrALAZINE (APRESOLINE) 25 MG tablet Take 25 mg by mouth 3 (three) times daily.    HYDROcodone-acetaminophen (NORCO/VICODIN) 5-325 MG tablet Take 1 tablet by mouth every 6 (six) hours as needed for moderate pain or severe pain. Qty: 20 tablet, Refills: 0    lipase/protease/amylase (CREON) 12000 UNITS CPEP capsule Take 12,000-36,000 Units by mouth See admin instructions. 36,000 units daily with breakfast; 24,000 units daily with lunch, and 36,000 units daily with dinner    multivitamin (RENA-VIT) TABS tablet Take 1 tablet by mouth daily.    omega-3 acid ethyl esters (LOVAZA) 1 g capsule Take 1 g by mouth daily.    pantoprazole (PROTONIX) 40 MG tablet Take 1 tablet (40 mg total) by mouth 2 (two) times daily before a meal. Qty: 60 tablet, Refills: 2    pregabalin (LYRICA) 25 MG capsule Take 25 mg by mouth daily.    promethazine (PHENERGAN) 12.5 MG tablet Take 12.5 mg by mouth every 8 (eight) hours as needed for nausea.    ranitidine (ZANTAC) 150 MG tablet Take 150 mg by mouth daily.     ranolazine (RANEXA) 1000 MG SR tablet Take 1,000 mg by mouth 2 (two) times daily.    sevelamer carbonate (RENVELA) 800 MG tablet Take 800 mg by mouth 3 (three) times daily with meals.     vitamin B-12 (CYANOCOBALAMIN) 1000 MCG tablet Take 1,000 mcg by mouth daily.        STOP taking these medications     citalopram (CELEXA) 20 MG tablet      metoCLOPramide (REGLAN) 5 MG tablet          DISCHARGE INSTRUCTIONS:    DIET:  Renal diet  DISCHARGE CONDITION:  Stable  ACTIVITY:  Activity as tolerated  OXYGEN:  Home Oxygen: 2L   Oxygen Delivery: 2LNC  DISCHARGE LOCATION:  home   If you experience worsening of your admission symptoms, develop shortness of breath, life threatening emergency, suicidal or homicidal thoughts you must seek medical attention immediately by calling 911 or calling your MD immediately  if symptoms less severe.  You Must read complete instructions/literature  along with all the possible adverse reactions/side effects for all the Medicines you take and that have been prescribed to you. Take any new Medicines after you have completely understood and accpet all the possible adverse reactions/side effects.   Please note  You were cared for by a hospitalist during your hospital stay. If you have any questions about your discharge medications or the care you received while you were in the hospital after you are discharged, you can call the unit and asked to speak with the hospitalist on call if the hospitalist that took care of you is not available. Once you are discharged, your primary care physician will handle any further medical issues. Please note that NO REFILLS for any discharge medications will be authorized once you are discharged, as it is imperative that you return to your primary care physician (or establish a relationship with a primary care physician if you do not have one) for your aftercare needs so that they can reassess your need for medications and monitor your lab values.    On the day of Discharge:   VITAL SIGNS:  Blood pressure 137/56, pulse 76, temperature 98 F (36.7 C), temperature source Oral, resp. rate 17, height 5\' 5"  (1.651 m), weight 150 lb 2.1 oz (68.1 kg), SpO2 97 %.  I/O:   Intake/Output  Summary (Last 24 hours) at 04/10/16 1017 Last data filed at 04/10/16 0943  Gross per 24 hour  Intake    380 ml  Output   1000 ml  Net   -620 ml    PHYSICAL EXAMINATION:  GENERAL:  55 y.o.-year-old patient lying in the bed with no acute distress.  EYES: Pupils equal, round, reactive to light and accommodation. No scleral icterus. Extraocular muscles intact.  HEENT: Head atraumatic, normocephalic. Oropharynx and nasopharynx clear.  NECK:  Supple, no jugular venous distention. No thyroid enlargement, no tenderness.  LUNGS: Normal breath sounds bilaterally, no wheezing, rales,rhonchi or crepitation. No use of accessory muscles of respiration.  CARDIOVASCULAR: S1, S2 normal. No murmurs, rubs, or gallops.  ABDOMEN: Soft, non-tender, non-distended. Bowel sounds present. No organomegaly or mass.  EXTREMITIES: No pedal edema, cyanosis, or clubbing.  NEUROLOGIC: Cranial nerves II through XII are intact. Muscle strength 5/5 in all extremities. Sensation intact. Gait not checked.  PSYCHIATRIC: The patient is alert and oriented x 3.  SKIN: No obvious rash, lesion, or ulcer.   DATA REVIEW:   CBC  Recent Labs Lab 04/09/16 0248  WBC 8.7  HGB 9.8*  HCT 30.0*  PLT 216    Chemistries   Recent Labs Lab 04/08/16 1032 04/09/16 0248  NA 134* 133*  K 3.8 4.3  CL 97* 92*  CO2 27 31  GLUCOSE 83 119*  BUN 18 23*  CREATININE 4.16* 4.78*  CALCIUM 8.3* 8.9  MG 2.4  --   AST 27 28  ALT 16 16  ALKPHOS 100 112  BILITOT 0.5 0.6    Cardiac Enzymes  Recent Labs Lab 04/09/16 0248  TROPONINI <0.03    Microbiology Results  Results for orders placed or performed during the hospital encounter of 04/08/16  Blood culture (routine x 2)     Status: None (Preliminary result)   Collection Time: 04/08/16  8:06 AM  Result Value Ref Range Status   Specimen Description BLOOD  Final   Special Requests BOTTLES DRAWN AEROBIC AND ANAEROBIC [place]  Final   Culture NO GROWTH 2 DAYS  Final    Report Status PENDING  Incomplete    RADIOLOGY:  No results found.   Management plans discussed with the patient, family and they are in agreement.  CODE STATUS:     Code Status Orders        Start     Ordered   04/08/16 1415  Full code   Continuous     04/08/16 1415    Code Status History    Date Active Date Inactive Code Status Order ID Comments User Context   03/15/2016  1:22 PM 03/20/2016 12:19 AM Full Code [id number]  Bettey Costa, MD Inpatient   02/16/2016  2:09 PM 02/19/2016 10:38 PM Full Code [id number]  Bettey Costa, MD ED   12/17/2015 11:39 AM 12/21/2015 10:09 PM Full Code [id number]  Dustin Flock, MD ED   11/12/2015  4:02 PM 12/01/2015  7:43 PM Full Code [id number]  Bettey Costa, MD Inpatient   10/07/2015  9:37 AM 10/12/2015  7:59 PM Full Code [id number]  Fritzi Mandes, MD ED   10/03/2015  3:55 AM 10/05/2015 10:30 PM Full Code [id number]  Lance Coon, MD Inpatient   08/31/2015  4:00 PM 09/03/2015 10:09 PM Full Code [id number]  Demetrios Loll, MD ED   07/26/2015  3:28 PM 07/26/2015  9:36 PM Full Code [id number]  Dionisio David, MD Inpatient   07/22/2015  5:36 PM 07/26/2015  3:28 PM Full Code [id number]  Vaughan Basta, MD Inpatient   06/05/2015  8:39 PM 06/07/2015  8:03 PM Full Code [id number]  Bettey Costa, MD Inpatient   05/21/2015  9:42 PM 05/24/2015  4:26 PM Full Code [id number]  Henreitta Leber, MD Inpatient   04/24/2015  8:55 AM 04/26/2015 10:11 PM Full Code [id number]  Demetrios Loll, MD Inpatient   04/01/2015  3:58 PM 04/05/2015  8:07 PM Full Code [id number]  Epifanio Lesches, MD ED   03/06/2015  8:05 AM 03/09/2015  9:11 PM Full Code [id number]  Juluis Mire, MD Inpatient   02/19/2015  4:14 PM 02/24/2015  5:41 PM Full Code [id number]  Aldean Jewett, MD Inpatient      TOTAL TIME TAKING CARE OF THIS PATIENT: 28 minutes.    Hower,  Karenann Cai.D on 04/10/2016 at 10:17 AM  Between 7am to 6pm - Pager - [phone]  After 6pm go to [email or web address] - Proofreader  [facility]  Office  [phone]  CC: Primary care physician; Ellamae Sia, MD

## 2016-04-10 NOTE — Progress Notes (Signed)
Okay per pharmacy to give all daily meds

## 2016-04-10 NOTE — Progress Notes (Signed)
SATURATION QUALIFICATIONS: (This note is used to comply with regulatory documentation for home oxygen)  Patient Saturations on Room Air at Rest = 86%  Pt 98% on 2L at rest   Patient Saturations on Room Air while Ambulating =%  Patient Saturations on  Liters of oxygen while Ambulating = %  Please briefly explain why patient needs home oxygen:

## 2016-04-10 NOTE — Progress Notes (Signed)
Subjective:  Patient known to our practice from outpatient dialysis. This morning she is doing well.  She at a good breakfast with no nausea or vomiting. She reports decreased urine volume with dark brown color. She also has some low back and abdomen discomfort. She had dialysis yesterday which was tolerated well. 1000 cc fluid removed.  Objective:  Vital signs in last 24 hours:  Temp:  [98 F (36.7 C)-98.9 F (37.2 C)] 98 F (36.7 C) (07/20 0517) Pulse Rate:  [69-88] 76 (07/20 0517) Resp:  [16-20] 17 (07/20 0517) BP: (126-157)/(45-62) 137/56 mmHg (07/20 0517) SpO2:  [96 %-100 %] 97 % (07/20 0517) Weight:  [68.1 kg (150 lb 2.1 oz)-69.6 kg (153 lb 7 oz)] 68.1 kg (150 lb 2.1 oz) (07/20 0500)  Weight change: 1.197 kg (2 lb 10.2 oz) Filed Weights   04/09/16 1335 04/09/16 1721 04/10/16 0500  Weight: 69.6 kg (153 lb 7 oz) 68.7 kg (151 lb 7.3 oz) 68.1 kg (150 lb 2.1 oz)    Intake/Output:    Intake/Output Summary (Last 24 hours) at 04/10/16 0951 Last data filed at 04/10/16 0943  Gross per 24 hour  Intake    380 ml  Output   1000 ml  Net   -620 ml     Physical Exam: General: Appears comfortable and in no acute distress. Sitting in bed.  HEENT Anicteric. Slight hearing impairment.   Neck Supple  Pulm/lungs Clear to auscultation bilaterally.  CVS/Heart Soft systolic murmur. Regular rhythm.   Abdomen:  Soft, non-tender.  Extremities: No peripheral edema.  Neurologic: Alert and able to answer questions appropriately.  Skin: No rashes.  Access: AV fistula.        Basic Metabolic Panel:   Recent Labs Lab 04/04/16 0744 04/07/16 0146 04/07/16 1330 04/08/16 1032 04/09/16 0248  NA 131* 128* 135 134* 133*  K 3.9 5.5* 3.2* 3.8 4.3  CL 91* 88* 94* 97* 92*  CO2 31 28 33* 27 31  GLUCOSE 66 81 44* 83 119*  BUN 17 35* 11 18 23*  CREATININE 4.16* 6.33* 2.78* 4.16* 4.78*  CALCIUM 8.8* 8.5* 8.0* 8.3* 8.9  MG  --   --  2.2 2.4  --      CBC:  Recent Labs Lab  04/04/16 0744 04/07/16 0146 04/07/16 1330 04/08/16 0942 04/09/16 0248  WBC 5.0 5.7 3.2* 6.7 8.7  NEUTROABS  --   --  1.6  --   --   HGB 9.6* 9.4* 9.2* 9.5* 9.8*  HCT 29.3* 28.0* 28.7* 29.6* 30.0*  MCV 75.6* 74.3* 75.1* 75.0* 75.4*  PLT 208 211 197 205 216      Microbiology:  Recent Results (from the past 720 hour(s))  Gastrointestinal Panel by PCR , Stool     Status: None   Collection Time: 03/16/16  2:25 PM  Result Value Ref Range Status   Campylobacter species NOT DETECTED NOT DETECTED Final   Plesimonas shigelloides NOT DETECTED NOT DETECTED Final   Salmonella species NOT DETECTED NOT DETECTED Final   Yersinia enterocolitica NOT DETECTED NOT DETECTED Final   Vibrio species NOT DETECTED NOT DETECTED Final   Vibrio cholerae NOT DETECTED NOT DETECTED Final   Enteroaggregative E coli (EAEC) NOT DETECTED NOT DETECTED Final   Enteropathogenic E coli (EPEC) NOT DETECTED NOT DETECTED Final   Enterotoxigenic E coli (ETEC) NOT DETECTED NOT DETECTED Final   Shiga like toxin producing E coli (STEC) NOT DETECTED NOT DETECTED Final   E. coli O157 NOT DETECTED NOT DETECTED Final  Shigella/Enteroinvasive E coli (EIEC) NOT DETECTED NOT DETECTED Final   Cryptosporidium NOT DETECTED NOT DETECTED Final   Cyclospora cayetanensis NOT DETECTED NOT DETECTED Final   Entamoeba histolytica NOT DETECTED NOT DETECTED Final   Giardia lamblia NOT DETECTED NOT DETECTED Final   Adenovirus F40/41 NOT DETECTED NOT DETECTED Final   Astrovirus NOT DETECTED NOT DETECTED Final   Norovirus GI/GII NOT DETECTED NOT DETECTED Final   Rotavirus A NOT DETECTED NOT DETECTED Final   Sapovirus (I, II, IV, and V) NOT DETECTED NOT DETECTED Final  C difficile quick scan w PCR reflex     Status: Abnormal   Collection Time: 03/16/16  2:25 PM  Result Value Ref Range Status   C Diff antigen POSITIVE (A) NEGATIVE Final   C Diff toxin NEGATIVE NEGATIVE Final   C Diff interpretation   Final    Positive for toxigenic C.  difficile, active toxin production not detected. Patient has toxigenic C. difficile organisms present in the bowel, but toxin was not detected. The patient may be a carrier or the level of toxin in the sample was below the limit  of detection. This information should be used in conjunction with the patient's clinical history when deciding on possible therapy.     Comment: CRITICAL RESULT CALLED TO, READ BACK BY AND VERIFIED WITH: Daleen Snook @ [id number] 03/16/16 by [facility]   Clostridium Difficile by PCR     Status: Abnormal   Collection Time: 03/16/16  2:25 PM  Result Value Ref Range Status   Toxigenic C Difficile by pcr POSITIVE (A) NEGATIVE Final    Comment: CRITICAL RESULT CALLED TO, READ BACK BY AND VERIFIED WITH: Daleen Snook @ [id number] 03/16/16 by [facility]   MRSA PCR Screening     Status: None   Collection Time: 03/18/16 10:46 AM  Result Value Ref Range Status   MRSA by PCR NEGATIVE NEGATIVE Final    Comment:        The GeneXpert MRSA Assay (FDA approved for NASAL specimens only), is one component of a comprehensive MRSA colonization surveillance program. It is not intended to diagnose MRSA infection nor to guide or monitor treatment for MRSA infections.   Blood culture (routine x 2)     Status: None (Preliminary result)   Collection Time: 04/08/16  8:06 AM  Result Value Ref Range Status   Specimen Description BLOOD  Final   Special Requests BOTTLES DRAWN AEROBIC AND ANAEROBIC [place]  Final   Culture NO GROWTH 2 DAYS  Final   Report Status PENDING  Incomplete    Coagulation Studies: No results for input(s): LABPROT, INR in the last 72 hours.  Urinalysis: No results for input(s): COLORURINE, LABSPEC, PHURINE, GLUCOSEU, HGBUR, BILIRUBINUR, KETONESUR, PROTEINUR, UROBILINOGEN, NITRITE, LEUKOCYTESUR in the last 72 hours.  Invalid input(s): APPERANCEUR    Imaging: No results found.   Medications:     . acidophilus  1 capsule Oral Daily  . amLODipine  10 mg Oral Daily   . aspirin EC  81 mg Oral BH-q7a  . atorvastatin  20 mg Oral QPM  . clopidogrel  75 mg Oral Daily  . famotidine  20 mg Oral Daily  . FLUoxetine  20 mg Oral Daily  . heparin  5,000 Units Subcutaneous Q8H  . hydrALAZINE  25 mg Oral TID  . insulin aspart  0-9 Units Subcutaneous TID WC  . lipase/protease/amylase  24,000 Units Oral Q lunch  . lipase/protease/amylase  36,000 Units Oral BID  . loratadine  10 mg Oral Daily  .  multivitamin  1 tablet Oral Daily  . OLANZapine  2.5 mg Oral QHS  . omega-3 acid ethyl esters  1 g Oral Daily  . pantoprazole  40 mg Oral BID AC  . pregabalin  25 mg Oral Daily  . ranolazine  1,000 mg Oral BID  . rifaximin  550 mg Oral BID  . sevelamer carbonate  800 mg Oral TID WC  . sodium chloride flush  3 mL Intravenous Q12H  . sodium chloride flush  3 mL Intravenous Q12H  . vitamin B-12  1,000 mcg Oral Daily   sodium chloride, albuterol, haloperidol lactate, HYDROcodone-acetaminophen, lactulose, promethazine, sodium chloride flush  Assessment/ Plan:  55 y.o. female with insulin dependent diabetes, hypertension, hyperlipidemia, diabetic retinopathy, diabetic neuropathy, diabetic gastroparesis, pancreatic insufficiency, depression, GERD. Renal masses - avoiding epo   MWF [facility] [address]   1. End Stage Renal Disease - Continue MWF schedule.   2. Anemia of chronic kidney disease: hemoglobin 9.8 Not getting erythropoeitin stimulating agent as outpatient due to renal masses. However CT from 6/24 does not seem to show any renal masses.   3. Secondary Hyperparathyroidism: will monitor phosphorus.  - Continue sevelamer     LOS: 2 Shakthi Scipio 7/20/20179:51 AM

## 2016-04-10 NOTE — Progress Notes (Signed)
Notified Dr. Lavetta Nielsen that pt was bladder scanned earlier and in and out cath was 650. Pt still okay to be discharged today per MD.

## 2016-04-10 NOTE — Care Management (Signed)
Patient Admitted with Metabolic encephalopathy.  Patient lives at home wither her son.  HD patient MWF [facility] [address] Patient PCP at [facility] and obtains her medications from their pharmacy.  Patient denies any issues obtains her medications.  Son provides transportation.  Patient utilizes [facility] for transportation to HD. Patient to discharge today.  I have informed her that she needs to call [facility] today to arrange transportation for tomorrow.    Patient has previously been open with [facility] health.  Patient was in the process of transitioning to [facility].  Per [facility] representative, patient has not followed up on the assessment phase even though they have assisted with arranging transportation.    [facility] through [facility].  For billing purposes qualifying O2 sats and order needed to be documented.  Those were obtained and sent to [facility].   Home health orders were placed.  Due to frequent admission patient would potentially qualify for [facility] services, however patient wishes to remain with [facility] home health.  Referral faxed to [facility].  [facility] is unable to open the patient at discharge due to their assessment process not being completed.    RNCM signing off

## 2016-04-10 NOTE — Progress Notes (Signed)
[facility] NAME: [name]    MRN#:  [id number]  DATE OF BIRTH:  02-12-1961  SUBJECTIVE:  Hospital Day: 2 days [name] is a 55 y.o. female presenting with Altered Mental Status .   Overnight events: no overnight events Interval Events: no complaints, more alert this morning  REVIEW OF SYSTEMS:  CONSTITUTIONAL: No fever, fatigue or weakness.  EYES: No blurred or double vision.  EARS, NOSE, AND THROAT: No tinnitus or ear pain.  RESPIRATORY: No cough, shortness of breath, wheezing or hemoptysis.  CARDIOVASCULAR: No chest pain, orthopnea, edema.  GASTROINTESTINAL: No nausea, vomiting, diarrhea or abdominal pain.  GENITOURINARY: No dysuria, hematuria.  ENDOCRINE: No polyuria, nocturia,  HEMATOLOGY: No anemia, easy bruising or bleeding SKIN: No rash or lesion. MUSCULOSKELETAL: No joint pain or arthritis.   NEUROLOGIC: No tingling, numbness, weakness.  PSYCHIATRY: No anxiety or depression.   DRUG ALLERGIES:   Allergies  Allergen Reactions  . Compazine [Prochlorperazine Edisylate] Anaphylaxis and Nausea And Vomiting    23 Jul - patient relates that she takes promethazine frequently with no problems.  . Ace Inhibitors Swelling  . Ativan [Lorazepam] Other (See Comments)    Reaction:  Hallucinations and headaches  . Codeine Nausea And Vomiting  . Dilaudid [Hydromorphone Hcl] Other (See Comments)    Delirium  . Gabapentin Other (See Comments)    Reaction:  Unknown   . Losartan Other (See Comments)    Reaction:  Unknown   . Ondansetron Other (See Comments)    Reaction:  Unknown   . Prochlorperazine Other (See Comments)    Reaction:  Unknown   . Scopolamine Other (See Comments)    Reaction:  Unknown   . Zofran [Ondansetron Hcl] Other (See Comments)    Reaction:  hallucinations   . Oxycodone Anxiety  . Tape Rash    VITALS:  Blood pressure 130/49, pulse 77, temperature 98.2 F (36.8 C), temperature source Oral,  resp. rate 18, height 5\' 5"  (1.651 m), weight 150 lb 2.1 oz (68.1 kg), SpO2 99 %.  PHYSICAL EXAMINATION:  VITAL SIGNS: Filed Vitals:   04/10/16 1210 04/10/16 1607  BP: 136/57 130/49  Pulse: 77   Temp: 98.2 F (36.8 C)   Resp: 6    GENERAL:54 y.o.female currently in no acute distress.  HEAD: Normocephalic, atraumatic.  EYES: Pupils equal, round, reactive to light. Extraocular muscles intact. No scleral icterus.  MOUTH: Moist mucosal membrane. Dentition intact. No abscess noted.  EAR, NOSE, THROAT: Clear without exudates. No external lesions.  NECK: Supple. No thyromegaly. No nodules. No JVD.  PULMONARY: Clear to ascultation, without wheeze rails or rhonci. No use of accessory muscles, Good respiratory effort. good air entry bilaterally CHEST: Nontender to palpation.  CARDIOVASCULAR: S1 and S2. Regular rate and rhythm. No murmurs, rubs, or gallops. No edema. Pedal pulses 2+ bilaterally.  GASTROINTESTINAL: Soft, nontender, nondistended. No masses. Positive bowel sounds. No hepatosplenomegaly.  MUSCULOSKELETAL: No swelling, clubbing, or edema. Range of motion full in all extremities.  NEUROLOGIC: Cranial nerves II through XII are intact. No gross focal neurological deficits. Sensation intact. Reflexes intact.  SKIN: No ulceration, lesions, rashes, or cyanosis. Skin warm and dry. Turgor intact.  PSYCHIATRIC: Mood, affect within normal limits. The patient is awake, alert and oriented x 3. Insight, judgment intact.      LABORATORY PANEL:   CBC  Recent Labs Lab 04/09/16 0248  WBC 8.7  HGB 9.8*  HCT 30.0*  PLT 216   ------------------------------------------------------------------------------------------------------------------  Chemistries   Recent Labs Lab 04/08/16 1032 04/09/16 0248  NA 134* 133*  K 3.8 4.3  CL 97* 92*  CO2 27 31  GLUCOSE 83 119*  BUN 18 23*  CREATININE 4.16* 4.78*  CALCIUM 8.3* 8.9  MG 2.4  --   AST 27 28  ALT 16 16  ALKPHOS 100 112  BILITOT  0.5 0.6   ------------------------------------------------------------------------------------------------------------------  Cardiac Enzymes  Recent Labs Lab 04/09/16 0248  TROPONINI <0.03   ------------------------------------------------------------------------------------------------------------------  RADIOLOGY:  No results found.  EKG:   Orders placed or performed during the hospital encounter of 04/08/16  . ED EKG  . ED EKG  . EKG 12-Lead  . EKG 12-Lead  . EKG 12-Lead  . EKG 12-Lead    ASSESSMENT AND PLAN:   [name] is a 55 y.o. female presenting with Altered Mental Status . Admitted 04/08/2016 : Day #: 2 days  #1 acute encephalopathy -Appreciate psychiatric consult.   #2 hepatic encephalopathy -continue lactulose and Xifaxan.  #3 chronic pancreatitis and chronic abdominal pain-continue pain medications and pancreatic supplements  #4 hypertension-continue home medications. On Norvasc, hydralazine  #5 DVT prophylaxis-on subcutaneous heparin  #6 history of diabetes mellitus-no diabetes now. Actually has been having hypoglycemic episodes lately during last admissions. Discontinue pre-meal insulin. Only on sliding scale insulin and sugar checks.   #7 myoclonic jerks-has had extrapyramidal side effects with Reglan in the past. It was restarted during last month admission. Discontinue and monitor after dialysis  Planning discharge today as previously mentioned - spoke with son - [name] - over the phone answered all questions, states would not be able to get her/care for her today -- can postpone d/c until after HD tomrrow   All the records are reviewed and case discussed with Care Management/Social Workerr. Management plans discussed with the patient, family and they are in agreement.  CODE STATUS: full TOTAL TIME TAKING CARE OF THIS PATIENT: 28 minutes.   POSSIBLE D/C IN 1DAYS, DEPENDING ON CLINICAL CONDITION.   Hower,  Karenann Cai.D on 04/10/2016 at 4:28  PM  Between 7am to 6pm - Pager - [phone]  After 6pm: House Pager: - [phone]  [facility]  Office  [phone]  CC: Primary care physician; Ellamae Sia, MD

## 2016-04-10 NOTE — Consult Note (Signed)
[facility] Consult   Reason for Consult:  Consult for 55 year old woman with multiple medical problems including depression who presented to the hospital 3 times in the last day with somatic symptoms. Concern about depression. Referring Physician:  Ether Griffins Patient Identification: PRACHI OFTEDAHL MRN:  [id number] Principal Diagnosis: Depression, major, recurrent, severe with psychosis ([place]) Diagnosis:   Patient Active Problem List   Diagnosis Date Noted  . Altered mental status [R41.82] 04/08/2016  . Hyperammonemia ([place]) [E72.20] 04/08/2016  . Elevated troponin [R79.89] 04/08/2016  . ESRD (end stage renal disease) ([place]) [N18.6] 04/08/2016  . Depression [F32.9] 04/08/2016  . Depression, major, recurrent, severe with psychosis ([place]) [F33.3] 04/08/2016  . Blood in stool [K92.1]   . Intractable cyclical vomiting with nausea [G43.A1]   . Reflux esophagitis [K21.0]   . Gastritis [K29.70]   . Generalized abdominal pain [R10.84]   . Uncontrollable vomiting [R11.10]   . Major depressive disorder, recurrent episode, moderate ([place]) [F33.1] 03/15/2016  . Adjustment disorder with mixed anxiety and depressed mood [F43.23] 03/15/2016  . Malnutrition of moderate degree [E44.0] 12/01/2015  . Renal mass [N28.89]   . Dyspnea [R06.00]   . Acute renal failure ([place]) [N17.9]   . Respiratory failure ([place]) [J96.90]   . High temperature [R50.9] 11/14/2015  . Pulmonary edema [J81.1]   . Encounter for central line placement [Z45.2]   . Encounter for orogastric (OG) tube placement [Z46.59]   . Nausea [R11.0] 11/12/2015  . Hyperkalemia [E87.5] 10/03/2015  . C. difficile diarrhea [A04.7] 07/22/2015  . Pneumonia [J18.9] 05/21/2015  . Hypoglycemia [E16.2] 04/24/2015  . Unresponsiveness [R40.4] 04/24/2015  . Bradycardia [R00.1] 04/24/2015  . Hypothermia [T68.XXXA] 04/24/2015  . Acute respiratory failure ([place]) [J96.00] 04/24/2015  . Acute diastolic CHF (congestive heart failure) ([place]) [I50.31]  04/05/2015  . Diabetic gastroparesis ([place]) [E11.43, K31.84] 04/05/2015  . Hypokalemia [E87.6] 04/05/2015  . Generalized weakness [R53.1] 04/05/2015  . Acute pulmonary edema ([place]) [J81.0] 04/03/2015  . Nausea and vomiting [R11.2] 04/03/2015  . Hypoglycemia associated with diabetes ([place]) [E11.649] 04/03/2015  . Anemia of chronic disease [D63.8] 04/03/2015  . Secondary hyperparathyroidism ([place]) [N25.81] 04/03/2015  . Pressure ulcer [L89.90] 04/02/2015  . Acute respiratory failure with hypoxia ([place]) [J96.01] 04/01/2015  . Adjustment disorder with anxiety [F43.22] 03/14/2015  . Somatic symptom disorder, mild [F45.8] 03/08/2015  . Coronary artery disease involving native coronary artery of native heart without angina pectoris [I25.10]   . Nausea & vomiting [R11.2] 03/06/2015  . Abdominal pain [R10.9] 03/06/2015  . DM (diabetes mellitus) ([place]) [E11.9] 03/06/2015  . HTN (hypertension) [I10] 03/06/2015  . Gastroparesis [K31.84] 02/24/2015  . Pleural effusion [J90] 02/19/2015  . HCAP (healthcare-associated pneumonia) [J18.9] 02/19/2015  . End-stage renal disease on hemodialysis ([place]) [N18.6, Z99.2] 02/19/2015    Total Time spent with patient: 20 minutes  Subjective:   [name] is a 55 y.o. female patient admitted with "that woman took my children away".  Follow-up Thursday the 20th. Patient seen and chart reviewed. She was sitting up eating her supper.Marland Kitchen Appropriate interaction. She tells me she is feeling much better. She slept well last night. Feels refreshed and like her strength is improving today. Denies any sense of depression. She denies any paranoia regarding her family and says that she feels embarrassed about how she spoke with her children when she was ill and has already apologized to them. Patient has insight into her condition and tells me she feels like mentally she is doing much better now.  HPI:  Patient interviewed. Chart  reviewed. Labs and vitals reviewed. Patient known from  previous hospital stay. 55 year old woman has a past history of major depression and recurrent mental health problems. She also has multiple serious chronic medical problems. She presented to the emergency room 3 times within about a 2 day period for physical symptoms including complaints of abdominal pain and mental status changes. Hospitalist evaluating her today felt that the patient's depression was severe. On interview today the patient is a little disorganized. Sometimes it is hard to keep her attention for the interview. She gets tangential and talks about how some woman took her children away from her. When asked who this was she says it is someone who says they are her daughter. Then she starts complaining about other family members. None of the story really makes sense. Patient says that her mood has been upset. She has not been sleeping or eating well. She has been feeling sick. She says she has been compliant with her medicine. Patient talks about hearing songs and voices in her head that are keeping her up all night long. She gets tearful very easily. Denies any suicidal thoughts.  Social history: Patient apparently is living with her son. She has multiple complaints that change from moment to moment about several members of the family. It's a little hard to follow exactly what she is complaining about.  Medical history: Multiple medical problems including end-stage renal disease on dialysis, diabetes hypertension history of coronary artery disease history of congestive heart failure  Past Psychiatric History: Patient is not a very reliable historian. Based on old notes it appears that the patient has had recurrent mental health issues. It is reported that she's had a couple of psychiatric hospitalizations in the past although they were years ago. Not clear if she is ever tried to harm herself. She denies it today. Had been seen by psychiatrists and maintained on antidepressants for years. Family  reports that she has had periods of decompensation with confusion mostly when she is medically sick.  Risk to Self: Is patient at risk for suicide?: No Risk to Others:   Prior Inpatient Therapy:   Prior Outpatient Therapy:    Past Medical History:  Past Medical History  Diagnosis Date  . Asthma   . Collagen vascular disease ([place])   . Diabetes mellitus without complication ([place])   . Hypertension   . Coronary artery disease     a. cath 2013: stenting to RCA (report not available); b. cath 2014: LM nl, pLAD 40%, mLAD nl, ost LCx 40%, mid LCx nl, pRCA 30% @ site of prior stent, mRCA 50%  . Diabetic neuropathy ([place])   . ESRD (end stage renal disease) on dialysis ([place])     M-W-F  . GERD (gastroesophageal reflux disease)   . Hx of pancreatitis 2015  . Myocardial infarction ([place])   . Pneumonia   . Mitral regurgitation     a. echo 10/2013: EF 62%, noWMA, mildly dilated LA, mild to mod MR/TR, GR1DD  . COPD (chronic obstructive pulmonary disease) ([place])   . chronic diastolic CHF 11/29/4074  . dialysis 2006  . Depression   . Anxiety   . Arthritis   . Headache   . Anemia   . Renal insufficiency     Past Surgical History  Procedure Laterality Date  . Cholecystectomy    . Appendectomy    . Abdominal hysterectomy    . Dialysis fistula creation Left     upper arm  . Esophagogastroduodenoscopy N/A 03/08/2015  Procedure: ESOPHAGOGASTRODUODENOSCOPY (EGD);  Surgeon: Manya Silvas, MD;  Location: [facility] ENDOSCOPY;  Service: Endoscopy;  Laterality: N/A;  . Cardiac catheterization Left 07/26/2015    Procedure: Left Heart Cath and Coronary Angiography;  Surgeon: Dionisio David, MD;  Location: [facility] CV LAB;  Service: Cardiovascular;  Laterality: Left;  Marland Kitchen Eye surgery    . Fecal transplant N/A 08/23/2015    Procedure: FECAL TRANSPLANT;  Surgeon: Manya Silvas, MD;  Location: [facility] ENDOSCOPY;  Service: Endoscopy;  Laterality: N/A;  . Peripheral vascular catheterization N/A 12/20/2015     Procedure: Thrombectomy of dialysis access versus permcath placement;  Surgeon: Algernon Huxley, MD;  Location: [facility] CV LAB;  Service: Cardiovascular;  Laterality: N/A;  . Peripheral vascular catheterization N/A 12/20/2015    Procedure: A/V Shunt Intervention;  Surgeon: Algernon Huxley, MD;  Location: [facility] CV LAB;  Service: Cardiovascular;  Laterality: N/A;  . Peripheral vascular catheterization N/A 12/20/2015    Procedure: A/V Shuntogram/Fistulagram;  Surgeon: Algernon Huxley, MD;  Location: [facility] CV LAB;  Service: Cardiovascular;  Laterality: N/A;  . Peripheral vascular catheterization N/A 01/02/2016    Procedure: A/V Shuntogram/Fistulagram;  Surgeon: Algernon Huxley, MD;  Location: [facility] CV LAB;  Service: Cardiovascular;  Laterality: N/A;  . Peripheral vascular catheterization N/A 01/02/2016    Procedure: A/V Shunt Intervention;  Surgeon: Algernon Huxley, MD;  Location: [facility] CV LAB;  Service: Cardiovascular;  Laterality: N/A;  . Esophagogastroduodenoscopy (egd) with propofol N/A 03/18/2016    Procedure: ESOPHAGOGASTRODUODENOSCOPY (EGD) WITH PROPOFOL;  Surgeon: [name], MD;  Location: [facility] ENDOSCOPY;  Service: Endoscopy;  Laterality: N/A;   Family History:  Family History  Problem Relation Age of Onset  . Kidney disease Mother   . Diabetes Mother    Family Psychiatric  History: Unknown whether there is any family history Social History:  History  Alcohol Use No     History  Drug Use No    Social History   Social History  . Marital Status: Divorced    Spouse Name: N/A  . Number of Children: N/A  . Years of Education: N/A   Social History Main Topics  . Smoking status: Former Smoker -- 0.50 packs/day    Types: Cigarettes    Quit date: 02/13/2015  . Smokeless tobacco: Never Used  . Alcohol Use: No  . Drug Use: No  . Sexual Activity: Not Asked   Other Topics Concern  . None   Social History Narrative   Additional Social History:    Allergies:    Allergies  Allergen Reactions  . Compazine [Prochlorperazine Edisylate] Anaphylaxis and Nausea And Vomiting    23 Jul - patient relates that she takes promethazine frequently with no problems.  . Ace Inhibitors Swelling  . Ativan [Lorazepam] Other (See Comments)    Reaction:  Hallucinations and headaches  . Codeine Nausea And Vomiting  . Dilaudid [Hydromorphone Hcl] Other (See Comments)    Delirium  . Gabapentin Other (See Comments)    Reaction:  Unknown   . Losartan Other (See Comments)    Reaction:  Unknown   . Ondansetron Other (See Comments)    Reaction:  Unknown   . Prochlorperazine Other (See Comments)    Reaction:  Unknown   . Scopolamine Other (See Comments)    Reaction:  Unknown   . Zofran [Ondansetron Hcl] Other (See Comments)    Reaction:  hallucinations   . Oxycodone Anxiety  . Tape Rash    Labs:  Results for orders placed or performed during the hospital encounter of 04/08/16 (from the past 48 hour(s))  Glucose, capillary     Status: Abnormal   Collection Time: 04/08/16  8:46 PM  Result Value Ref Range   Glucose-Capillary 103 (H) 65 - 99 mg/dL   Comment 1 Notify RN   Troponin I     Status: None   Collection Time: 04/09/16  2:48 AM  Result Value Ref Range   Troponin I <0.03 <0.03 ng/mL  Comprehensive metabolic panel     Status: Abnormal   Collection Time: 04/09/16  2:48 AM  Result Value Ref Range   Sodium 133 (L) 135 - 145 mmol/L   Potassium 4.3 3.5 - 5.1 mmol/L   Chloride 92 (L) 101 - 111 mmol/L   CO2 31 22 - 32 mmol/L   Glucose, Bld 119 (H) 65 - 99 mg/dL   BUN 23 (H) 6 - 20 mg/dL   Creatinine, Ser 4.78 (H) 0.44 - 1.00 mg/dL   Calcium 8.9 8.9 - 10.3 mg/dL   Total Protein 9.3 (H) 6.5 - 8.1 g/dL   Albumin 3.5 3.5 - 5.0 g/dL   AST 28 15 - 41 U/L   ALT 16 14 - 54 U/L   Alkaline Phosphatase 112 38 - 126 U/L   Total Bilirubin 0.6 0.3 - 1.2 mg/dL   GFR calc non Af Amer 9 (L) >60 mL/min   GFR calc Af Amer 11 (L) >60 mL/min    Comment: (NOTE) The eGFR  has been calculated using the CKD EPI equation. This calculation has not been validated in all clinical situations. eGFR's persistently <60 mL/min signify possible Chronic Kidney Disease.    Anion gap 10 5 - 15  CBC     Status: Abnormal   Collection Time: 04/09/16  2:48 AM  Result Value Ref Range   WBC 8.7 3.6 - 11.0 K/uL   RBC 3.97 3.80 - 5.20 MIL/uL   Hemoglobin 9.8 (L) 12.0 - 16.0 g/dL   HCT 30.0 (L) 35.0 - 47.0 %   MCV 75.4 (L) 80.0 - 100.0 fL   MCH 24.6 (L) 26.0 - 34.0 pg   MCHC 32.6 32.0 - 36.0 g/dL   RDW 21.6 (H) 11.5 - 14.5 %   Platelets 216 150 - 440 K/uL  Glucose, capillary     Status: Abnormal   Collection Time: 04/09/16  7:49 AM  Result Value Ref Range   Glucose-Capillary 48 (L) 65 - 99 mg/dL  Glucose, capillary     Status: Abnormal   Collection Time: 04/09/16  8:12 AM  Result Value Ref Range   Glucose-Capillary 57 (L) 65 - 99 mg/dL  Glucose, capillary     Status: None   Collection Time: 04/09/16  8:37 AM  Result Value Ref Range   Glucose-Capillary 90 65 - 99 mg/dL  Glucose, capillary     Status: None   Collection Time: 04/09/16 11:31 AM  Result Value Ref Range   Glucose-Capillary 98 65 - 99 mg/dL  Glucose, capillary     Status: None   Collection Time: 04/09/16  6:09 PM  Result Value Ref Range   Glucose-Capillary 91 65 - 99 mg/dL  Glucose, capillary     Status: Abnormal   Collection Time: 04/09/16  9:18 PM  Result Value Ref Range   Glucose-Capillary 114 (H) 65 - 99 mg/dL  Glucose, capillary     Status: None   Collection Time: 04/10/16  7:52 AM  Result Value Ref Range   Glucose-Capillary  81 65 - 99 mg/dL  Urinalysis complete, with microscopic ([facility] only)     Status: Abnormal   Collection Time: 04/10/16 11:14 AM  Result Value Ref Range   Color, Urine YELLOW (A) YELLOW   APPearance CLEAR (A) CLEAR   Glucose, UA NEGATIVE NEGATIVE mg/dL   Bilirubin Urine NEGATIVE NEGATIVE   Ketones, ur NEGATIVE NEGATIVE mg/dL   Specific Gravity, Urine 1.009 1.005 - 1.030    Hgb urine dipstick NEGATIVE NEGATIVE   pH 9.0 (H) 5.0 - 8.0   Protein, ur 100 (A) NEGATIVE mg/dL   Nitrite NEGATIVE NEGATIVE   Leukocytes, UA NEGATIVE NEGATIVE   RBC / HPF 0-5 0 - 5 RBC/hpf   WBC, UA 0-5 0 - 5 WBC/hpf   Bacteria, UA NONE SEEN NONE SEEN   Squamous Epithelial / LPF NONE SEEN NONE SEEN  Glucose, capillary     Status: None   Collection Time: 04/10/16 11:39 AM  Result Value Ref Range   Glucose-Capillary 99 65 - 99 mg/dL  Glucose, capillary     Status: Abnormal   Collection Time: 04/10/16  4:09 PM  Result Value Ref Range   Glucose-Capillary 115 (H) 65 - 99 mg/dL    Current Facility-Administered Medications  Medication Dose Route Frequency Provider Last Rate Last Dose  . 0.9 %  sodium chloride infusion  250 mL Intravenous PRN Theodoro Grist, MD      . acidophilus (RISAQUAD) capsule 1 capsule  1 capsule Oral Daily Theodoro Grist, MD   1 capsule at 04/10/16 1040  . albuterol (PROVENTIL) (2.5 MG/3ML) 0.083% nebulizer solution 2.5 mg  2.5 mg Nebulization Q4H PRN Theodoro Grist, MD      . amLODipine (NORVASC) tablet 10 mg  10 mg Oral Daily Theodoro Grist, MD   10 mg at 04/10/16 1041  . aspirin EC tablet 81 mg  81 mg Oral Hulen Skains, MD   81 mg at 04/10/16 0856  . atorvastatin (LIPITOR) tablet 20 mg  20 mg Oral QPM Theodoro Grist, MD   20 mg at 04/10/16 1734  . clopidogrel (PLAVIX) tablet 75 mg  75 mg Oral Daily Theodoro Grist, MD   75 mg at 04/10/16 1040  . famotidine (PEPCID) tablet 20 mg  20 mg Oral Daily Theodoro Grist, MD   20 mg at 04/10/16 1041  . FLUoxetine (PROZAC) capsule 20 mg  20 mg Oral Daily Gonzella Lex, MD   20 mg at 04/10/16 1042  . haloperidol lactate (HALDOL) injection 1 mg  1 mg Intravenous Q4H PRN Theodoro Grist, MD      . heparin injection 5,000 Units  5,000 Units Subcutaneous Q8H Theodoro Grist, MD   5,000 Units at 04/10/16 1441  . hydrALAZINE (APRESOLINE) tablet 25 mg  25 mg Oral TID Theodoro Grist, MD   25 mg at 04/10/16 1609  .  HYDROcodone-acetaminophen (NORCO/VICODIN) 5-325 MG per tablet 1 tablet  1 tablet Oral Q6H PRN Gladstone Lighter, MD   1 tablet at 04/09/16 2113  . insulin aspart (novoLOG) injection 0-9 Units  0-9 Units Subcutaneous TID WC Theodoro Grist, MD   2 Units at 04/08/16 1741  . lactulose (CHRONULAC) 10 GM/15ML solution 30 g  30 g Oral BID PRN Theodoro Grist, MD      . lipase/protease/amylase (CREON) capsule 24,000 Units  24,000 Units Oral Q lunch Theodoro Grist, MD   24,000 Units at 04/10/16 1211  . lipase/protease/amylase (CREON) capsule 36,000 Units  36,000 Units Oral BID Theodoro Grist, MD   36,000 Units at 04/10/16  1734  . loratadine (CLARITIN) tablet 10 mg  10 mg Oral Daily Theodoro Grist, MD   10 mg at 04/10/16 1040  . multivitamin (RENA-VIT) tablet 1 tablet  1 tablet Oral Daily Theodoro Grist, MD   1 tablet at 04/10/16 1041  . OLANZapine (ZYPREXA) tablet 2.5 mg  2.5 mg Oral QHS Gonzella Lex, MD   2.5 mg at 04/09/16 2108  . omega-3 acid ethyl esters (LOVAZA) capsule 1 g  1 g Oral Daily Theodoro Grist, MD   1 g at 04/10/16 1041  . pantoprazole (PROTONIX) EC tablet 40 mg  40 mg Oral BID AC Theodoro Grist, MD   40 mg at 04/10/16 1734  . pregabalin (LYRICA) capsule 25 mg  25 mg Oral Daily Theodoro Grist, MD   25 mg at 04/10/16 1041  . promethazine (PHENERGAN) tablet 12.5 mg  12.5 mg Oral Q8H PRN Theodoro Grist, MD      . ranolazine (RANEXA) 12 hr tablet 1,000 mg  1,000 mg Oral BID Theodoro Grist, MD   1,000 mg at 04/10/16 1041  . rifaximin (XIFAXAN) tablet 550 mg  550 mg Oral BID Theodoro Grist, MD   550 mg at 04/10/16 1040  . sevelamer carbonate (RENVELA) tablet 800 mg  800 mg Oral TID WC Theodoro Grist, MD   800 mg at 04/10/16 1734  . sodium chloride flush (NS) 0.9 % injection 3 mL  3 mL Intravenous Q12H Theodoro Grist, MD   3 mL at 04/08/16 1430  . sodium chloride flush (NS) 0.9 % injection 3 mL  3 mL Intravenous Q12H Theodoro Grist, MD   3 mL at 04/10/16 1040  . sodium chloride flush (NS) 0.9 % injection 3 mL  3  mL Intravenous PRN Theodoro Grist, MD      . vitamin B-12 (CYANOCOBALAMIN) tablet 1,000 mcg  1,000 mcg Oral Daily Theodoro Grist, MD   1,000 mcg at 04/10/16 1040    Musculoskeletal: Strength & Muscle Tone: decreased and atrophy Gait & Station: unsteady Patient leans: N/A  Psychiatric Specialty Exam: Physical Exam  Nursing note and vitals reviewed. Constitutional: She appears well-developed. No distress.  HENT:  Head: Normocephalic and atraumatic.  Eyes: Conjunctivae are normal. Pupils are equal, round, and reactive to light.  Neck: Normal range of motion.  Cardiovascular: Normal heart sounds.   Respiratory: No respiratory distress.  GI: Soft.  Musculoskeletal: Normal range of motion.  Neurological: She is alert.  Skin: Skin is warm and dry.  Psychiatric: Her mood appears not anxious. Her affect is not labile. Her speech is not delayed and not tangential. She is not withdrawn. Thought content is not paranoid and not delusional. Cognition and memory are not impaired. She does not express impulsivity. She expresses no suicidal ideation. She is attentive.    Review of Systems  Constitutional: Negative.   HENT: Negative.   Eyes: Negative.   Respiratory: Negative for shortness of breath.   Cardiovascular: Positive for leg swelling.  Gastrointestinal: Negative for nausea, vomiting and abdominal pain.  Musculoskeletal: Negative.   Skin: Negative.   Neurological: Negative.   Psychiatric/Behavioral: Negative for depression, suicidal ideas, hallucinations, memory loss and substance abuse. The patient is not nervous/anxious and does not have insomnia.     Blood pressure 130/49, pulse 77, temperature 98.2 F (36.8 C), temperature source Oral, resp. rate 18, height 5' 5"  (1.651 m), weight 68.1 kg (150 lb 2.1 oz), SpO2 99 %.Body mass index is 24.98 kg/(m^2).  General Appearance: Casual  Eye Contact:  Good  Speech:  Clear and Coherent  Volume:  Decreased  Mood:  Euthymic  Affect:   Appropriate  Thought Process:  Coherent  Orientation:  Full (Time, Place, and Person)  Thought Content:  Logical  Suicidal Thoughts:  No  Homicidal Thoughts:  No  Memory:  Immediate;   Good Recent;   Fair Remote;   Fair  Judgement:  Fair  Insight:  Good  Psychomotor Activity:  Normal  Concentration:  Concentration: Fair  Recall:  Poor  Fund of Knowledge:  Good  Language:  Fair  Akathisia:  No  Handed:  Right  AIMS (if indicated):     Assets:  Housing Social Support  ADL's:  Intact  Cognition:  WNL  Sleep:        Treatment Plan Summary: Daily contact with patient to assess and evaluate symptoms and progress in treatment, Medication management and Plan Patient has shown improvement daily in terms of her anxiety and mood and her thinking clarity. I had changed her medicine by discontinuing citalopram and starting her on a modest dose of fluoxetine and starting a very small dose of Zyprexa at night. Zyprexa 5 think is probably helping with her anxiety and mood and also has had the predictable but in this case beneficial side effect of helping her sleep better and improving her appetite. Education done with the patient about her medicine. I think she should continue on this at discharge. She needs to make sure she is getting outpatient follow-up for mental health treatment.  Disposition: Supportive therapy provided about ongoing stressors. Discussed crisis plan, support from social network, calling 911, coming to the Emergency Department, and calling Suicide Hotline.  Alethia Berthold, MD 04/10/2016 5:46 PM

## 2016-04-11 LAB — RENAL FUNCTION PANEL
Albumin: 2.7 g/dL — ABNORMAL LOW (ref 3.5–5.0)
Anion gap: 5 (ref 5–15)
BUN: 23 mg/dL — ABNORMAL HIGH (ref 6–20)
CO2: 30 mmol/L (ref 22–32)
Calcium: 8.4 mg/dL — ABNORMAL LOW (ref 8.9–10.3)
Chloride: 96 mmol/L — ABNORMAL LOW (ref 101–111)
Creatinine, Ser: 3.88 mg/dL — ABNORMAL HIGH (ref 0.44–1.00)
GFR calc Af Amer: 14 mL/min — ABNORMAL LOW (ref >60)
GFR calc non Af Amer: 12 mL/min — ABNORMAL LOW (ref >60)
Glucose, Bld: 108 mg/dL — ABNORMAL HIGH (ref 65–99)
Phosphorus: 2.5 mg/dL (ref 2.5–4.6)
Potassium: 4.8 mmol/L (ref 3.5–5.1)
Sodium: 131 mmol/L — ABNORMAL LOW (ref 135–145)

## 2016-04-11 LAB — CBC
HCT: 24.4 % — ABNORMAL LOW (ref 35.0–47.0)
Hemoglobin: 8 g/dL — ABNORMAL LOW (ref 12.0–16.0)
MCH: 24.3 pg — ABNORMAL LOW (ref 26.0–34.0)
MCHC: 32.5 g/dL (ref 32.0–36.0)
MCV: 74.5 fL — ABNORMAL LOW (ref 80.0–100.0)
Platelets: 167 10*3/uL (ref 150–440)
RBC: 3.28 MIL/uL — ABNORMAL LOW (ref 3.80–5.20)
RDW: 21.9 % — ABNORMAL HIGH (ref 11.5–14.5)
WBC: 4.2 10*3/uL (ref 3.6–11.0)

## 2016-04-11 LAB — GLUCOSE, CAPILLARY
Glucose-Capillary: 105 mg/dL — ABNORMAL HIGH (ref 65–99)
Glucose-Capillary: 88 mg/dL (ref 65–99)

## 2016-04-11 MED ORDER — PROMETHAZINE HCL 25 MG PO TABS
12.5000 mg | ORAL_TABLET | Freq: Three times a day (TID) | ORAL | Status: DC | PRN
  Administered 2016-04-11: 12.5 mg via ORAL

## 2016-04-11 NOTE — Discharge Summary (Signed)
[facility] NAME: [name]    MR#:  [id number]  DATE OF BIRTH:  [birth year]  DATE OF ADMISSION:  04/08/2016 ADMITTING PHYSICIAN: Robin Grist, MD  DATE OF DISCHARGE: 04/11/2016  PRIMARY CARE PHYSICIAN: Robin Sia, MD    ADMISSION DIAGNOSIS:  Hyperammonemia ([place]) [E72.20] Elevated troponin [R79.89] Altered mental status, unspecified altered mental status type [R41.82]  DISCHARGE DIAGNOSIS:  Principal Problem: Metabolic encephalopathy Hepatic encephalopathy   Depression, major, recurrent, severe with psychosis ([place])   Altered mental status   Elevated troponin   ESRD (end stage renal disease) ([place])   SECONDARY DIAGNOSIS:   Past Medical History  Diagnosis Date  . Asthma   . Collagen vascular disease ([place])   . Diabetes mellitus without complication ([place])   . Hypertension   . Coronary artery disease     a. cath 2013: stenting to RCA (report not available); b. cath 2014: LM nl, pLAD 40%, mLAD nl, ost LCx 40%, mid LCx nl, pRCA 30% @ site of prior stent, mRCA 50%  . Diabetic neuropathy ([place])   . ESRD (end stage renal disease) on dialysis ([place])     M-W-F  . GERD (gastroesophageal reflux disease)   . Hx of pancreatitis 2015  . Myocardial infarction ([place])   . Pneumonia   . Mitral regurgitation     a. echo 10/2013: EF 62%, noWMA, mildly dilated LA, mild to mod MR/TR, GR1DD  . COPD (chronic obstructive pulmonary disease) ([place])   . chronic diastolic CHF [id number]  . dialysis 2006  . Depression   . Anxiety   . Arthritis   . Headache   . Anemia   . Renal insufficiency     HOSPITAL COURSE:  [name]  is a 55 y.o. female admitted 04/08/2016 with chief complaint Altered Mental Status . Please see H&P performed by Robin Grist, MD for further information. Patient presented with the above symptoms. Metabolic encephalopathy believed to be multifactorial including hepatic encephalopathy, medication, and psychiatric. She  was started on lactulose and xifaximin as well as evaluted by psych. Psychiatry changed her medications. Between these 2 modalities her mental status improved to baseline.  DISCHARGE CONDITIONS:   stable  CONSULTS OBTAINED:  Treatment Team:  Robin Lex, MD Robin Kida, MD Robin Iba, MD  DRUG ALLERGIES:   Allergies  Allergen Reactions  . Compazine [Prochlorperazine Edisylate] Anaphylaxis and Nausea And Vomiting    23 Jul - patient relates that she takes promethazine frequently with no problems.  . Ace Inhibitors Swelling  . Ativan [Lorazepam] Other (See Comments)    Reaction:  Hallucinations and headaches  . Codeine Nausea And Vomiting  . Dilaudid [Hydromorphone Hcl] Other (See Comments)    Delirium  . Gabapentin Other (See Comments)    Reaction:  Unknown   . Losartan Other (See Comments)    Reaction:  Unknown   . Ondansetron Other (See Comments)    Reaction:  Unknown   . Prochlorperazine Other (See Comments)    Reaction:  Unknown   . Scopolamine Other (See Comments)    Reaction:  Unknown   . Zofran [Ondansetron Hcl] Other (See Comments)    Reaction:  hallucinations   . Oxycodone Anxiety  . Tape Rash    DISCHARGE MEDICATIONS:   Current Discharge Medication List    START taking these medications   Details  FLUoxetine (PROZAC) 20 MG capsule Take 1 capsule (20 mg total) by mouth daily. Qty: 30 capsule, Refills:  0    OLANZapine (ZYPREXA) 2.5 MG tablet Take 1 tablet (2.5 mg total) by mouth at bedtime. Qty: 30 tablet, Refills: 0    rifaximin (XIFAXAN) 550 MG TABS tablet Take 1 tablet (550 mg total) by mouth 2 (two) times daily. Qty: 60 tablet, Refills: 0      CONTINUE these medications which have NOT CHANGED   Details  acidophilus (RISAQUAD) CAPS capsule Take 1 capsule by mouth daily.    albuterol (PROVENTIL HFA;VENTOLIN HFA) 108 (90 Base) MCG/ACT inhaler Inhale 2 puffs into the lungs every 4 (four) hours as needed for wheezing or shortness of  breath.    albuterol (PROVENTIL) (2.5 MG/3ML) 0.083% nebulizer solution Take 2.5 mg by nebulization every 4 (four) hours as needed for wheezing or shortness of breath.    amLODipine (NORVASC) 10 MG tablet Take 10 mg by mouth daily.    aspirin EC 81 MG tablet Take 81 mg by mouth every morning.     atorvastatin (LIPITOR) 20 MG tablet Take 20 mg by mouth every evening.    cetirizine (ZYRTEC) 10 MG tablet Take 10 mg by mouth daily.     clopidogrel (PLAVIX) 75 MG tablet Take 75 mg by mouth daily. Reported on 12/17/2015    glucose 4 GM chewable tablet Chew 3-4 tablets by mouth as needed for low blood sugar.    hydrALAZINE (APRESOLINE) 25 MG tablet Take 25 mg by mouth 3 (three) times daily.    HYDROcodone-acetaminophen (NORCO/VICODIN) 5-325 MG tablet Take 1 tablet by mouth every 6 (six) hours as needed for moderate pain or severe pain. Qty: 20 tablet, Refills: 0    lipase/protease/amylase (CREON) 12000 UNITS CPEP capsule Take 12,000-36,000 Units by mouth See admin instructions. 36,000 units daily with breakfast; 24,000 units daily with lunch, and 36,000 units daily with dinner    multivitamin (RENA-VIT) TABS tablet Take 1 tablet by mouth daily.    omega-3 acid ethyl esters (LOVAZA) 1 g capsule Take 1 g by mouth daily.    pantoprazole (PROTONIX) 40 MG tablet Take 1 tablet (40 mg total) by mouth 2 (two) times daily before a meal. Qty: 60 tablet, Refills: 2    pregabalin (LYRICA) 25 MG capsule Take 25 mg by mouth daily.    promethazine (PHENERGAN) 12.5 MG tablet Take 12.5 mg by mouth every 8 (eight) hours as needed for nausea.    ranitidine (ZANTAC) 150 MG tablet Take 150 mg by mouth daily.     ranolazine (RANEXA) 1000 MG SR tablet Take 1,000 mg by mouth 2 (two) times daily.    sevelamer carbonate (RENVELA) 800 MG tablet Take 800 mg by mouth 3 (three) times daily with meals.     vitamin B-12 (CYANOCOBALAMIN) 1000 MCG tablet Take 1,000 mcg by mouth daily.      STOP taking these  medications     citalopram (CELEXA) 20 MG tablet      metoCLOPramide (REGLAN) 5 MG tablet          DISCHARGE INSTRUCTIONS:    DIET:  Renal diet  DISCHARGE CONDITION:  Stable  ACTIVITY:  Activity as tolerated  OXYGEN:  Home Oxygen: 2L   Oxygen Delivery: 2LNC  DISCHARGE LOCATION:  home   If you experience worsening of your admission symptoms, develop shortness of breath, life threatening emergency, suicidal or homicidal thoughts you must seek medical attention immediately by calling 911 or calling your MD immediately  if symptoms less severe.  You Must read complete instructions/literature along with all the possible adverse reactions/side effects  for all the Medicines you take and that have been prescribed to you. Take any new Medicines after you have completely understood and accpet all the possible adverse reactions/side effects.   Please note  You were cared for by a hospitalist during your hospital stay. If you have any questions about your discharge medications or the care you received while you were in the hospital after you are discharged, you can call the unit and asked to speak with the hospitalist on call if the hospitalist that took care of you is not available. Once you are discharged, your primary care physician will handle any further medical issues. Please note that NO REFILLS for any discharge medications will be authorized once you are discharged, as it is imperative that you return to your primary care physician (or establish a relationship with a primary care physician if you do not have one) for your aftercare needs so that they can reassess your need for medications and monitor your lab values.    On the day of Discharge:   VITAL SIGNS:  Blood pressure 125/50, pulse 70, temperature 98.5 F (36.9 C), temperature source Oral, resp. rate 17, height 5\' 5"  (1.651 m), weight 154 lb 8.7 oz (70.1 kg), SpO2 100 %.  I/O:    Intake/Output Summary (Last 24  hours) at 04/11/16 0921 Last data filed at 04/11/16 0200  Gross per 24 hour  Intake    440 ml  Output    650 ml  Net   -210 ml    PHYSICAL EXAMINATION:  GENERAL:  55 y.o.-year-old patient lying in the bed with no acute distress.  EYES: Pupils equal, round, reactive to light and accommodation. No scleral icterus. Extraocular muscles intact.  HEENT: Head atraumatic, normocephalic. Oropharynx and nasopharynx clear.  NECK:  Supple, no jugular venous distention. No thyroid enlargement, no tenderness.  LUNGS: Normal breath sounds bilaterally, no wheezing, rales,rhonchi or crepitation. No use of accessory muscles of respiration.  CARDIOVASCULAR: S1, S2 normal. No murmurs, rubs, or gallops.  ABDOMEN: Soft, non-tender, non-distended. Bowel sounds present. No organomegaly or mass.  EXTREMITIES: No pedal edema, cyanosis, or clubbing.  NEUROLOGIC: Cranial nerves II through XII are intact. Muscle strength 5/5 in all extremities. Sensation intact. Gait not checked.  PSYCHIATRIC: The patient is alert and oriented x 3.  SKIN: No obvious rash, lesion, or ulcer.   DATA REVIEW:   CBC  Recent Labs Lab 04/09/16 0248  WBC 8.7  HGB 9.8*  HCT 30.0*  PLT 216    Chemistries   Recent Labs Lab 04/08/16 1032 04/09/16 0248  NA 134* 133*  K 3.8 4.3  CL 97* 92*  CO2 27 31  GLUCOSE 83 119*  BUN 18 23*  CREATININE 4.16* 4.78*  CALCIUM 8.3* 8.9  MG 2.4  --   AST 27 28  ALT 16 16  ALKPHOS 100 112  BILITOT 0.5 0.6    Cardiac Enzymes  Recent Labs Lab 04/09/16 0248  TROPONINI <0.03    Microbiology Results  Results for orders placed or performed during the hospital encounter of 04/08/16  Blood culture (routine x 2)     Status: None (Preliminary result)   Collection Time: 04/08/16  8:06 AM  Result Value Ref Range Status   Specimen Description BLOOD  Final   Special Requests BOTTLES DRAWN AEROBIC AND ANAEROBIC [place]  Final   Culture NO GROWTH 3 DAYS  Final   Report Status  PENDING  Incomplete    RADIOLOGY:  No results found.  Management plans discussed with the patient, family and they are in agreement.  CODE STATUS:     Code Status Orders        Start     Ordered   04/08/16 1415  Full code   Continuous     04/08/16 1415    Code Status History    Date Active Date Inactive Code Status Order ID Comments User Context   03/15/2016  1:22 PM 03/20/2016 12:19 AM Full Code [id number]  Bettey Costa, MD Inpatient   02/16/2016  2:09 PM 02/19/2016 10:38 PM Full Code [id number]  Bettey Costa, MD ED   12/17/2015 11:39 AM 12/21/2015 10:09 PM Full Code [id number]  Dustin Flock, MD ED   11/12/2015  4:02 PM 12/01/2015  7:43 PM Full Code [id number]  Bettey Costa, MD Inpatient   10/07/2015  9:37 AM 10/12/2015  7:59 PM Full Code [id number]  Fritzi Mandes, MD ED   10/03/2015  3:55 AM 10/05/2015 10:30 PM Full Code [id number]  Lance Coon, MD Inpatient   08/31/2015  4:00 PM 09/03/2015 10:09 PM Full Code [id number]  Demetrios Loll, MD ED   07/26/2015  3:28 PM 07/26/2015  9:36 PM Full Code [id number]  Dionisio Shannon Kirkendall, MD Inpatient   07/22/2015  5:36 PM 07/26/2015  3:28 PM Full Code [id number]  Vaughan Basta, MD Inpatient   06/05/2015  8:39 PM 06/07/2015  8:03 PM Full Code [id number]  Bettey Costa, MD Inpatient   05/21/2015  9:42 PM 05/24/2015  4:26 PM Full Code [id number]  Henreitta Leber, MD Inpatient   04/24/2015  8:55 AM 04/26/2015 10:11 PM Full Code [id number]  Demetrios Loll, MD Inpatient   04/01/2015  3:58 PM 04/05/2015  8:07 PM Full Code [id number]  Epifanio Lesches, MD ED   03/06/2015  8:05 AM 03/09/2015  9:11 PM Full Code [id number]  Juluis Mire, MD Inpatient   02/19/2015  4:14 PM 02/24/2015  5:41 PM Full Code [id number]  Aldean Jewett, MD Inpatient      TOTAL TIME TAKING CARE OF THIS PATIENT: 28 minutes.    Cono Gebhard,  Karenann Cai.D on 04/11/2016 at 9:21 AM  Between 7am to 6pm - Pager - [phone]  After 6pm go to [email or web address] - Proofreader  [facility]  Office   [phone]  CC: Primary care physician; Robin Sia, MD

## 2016-04-11 NOTE — Progress Notes (Signed)
Start of HD tx.

## 2016-04-11 NOTE — Consult Note (Signed)
[facility] Consult   Reason for Consult:  Consult for 55 year old woman with multiple medical problems including depression who presented to the hospital 3 times in the last day with somatic symptoms. Concern about depression. Referring Physician:  Ether Griffins Patient Identification: [name] MRN:  [id number] Principal Diagnosis: Depression, major, recurrent, severe with psychosis ([name]) Diagnosis:   Patient Active Problem List   Diagnosis Date Noted  . Altered mental status [R41.82] 04/08/2016  . Hyperammonemia ([name]) [E72.20] 04/08/2016  . Elevated troponin [R79.89] 04/08/2016  . ESRD (end stage renal disease) ([name]) [N18.6] 04/08/2016  . Depression [F32.9] 04/08/2016  . Depression, major, recurrent, severe with psychosis ([name]) [F33.3] 04/08/2016  . Blood in stool [K92.1]   . Intractable cyclical vomiting with nausea [G43.A1]   . Reflux esophagitis [K21.0]   . Gastritis [K29.70]   . Generalized abdominal pain [R10.84]   . Uncontrollable vomiting [R11.10]   . Major depressive disorder, recurrent episode, moderate ([name]) [F33.1] 03/15/2016  . Adjustment disorder with mixed anxiety and depressed mood [F43.23] 03/15/2016  . Malnutrition of moderate degree [E44.0] 12/01/2015  . Renal mass [N28.89]   . Dyspnea [R06.00]   . Acute renal failure ([name]) [N17.9]   . Respiratory failure ([name]) [J96.90]   . High temperature [R50.9] 11/14/2015  . Pulmonary edema [J81.1]   . Encounter for central line placement [Z45.2]   . Encounter for orogastric (OG) tube placement [Z46.59]   . Nausea [R11.0] 11/12/2015  . Hyperkalemia [E87.5] 10/03/2015  . C. difficile diarrhea [A04.7] 07/22/2015  . Pneumonia [J18.9] 05/21/2015  . Hypoglycemia [E16.2] 04/24/2015  . Unresponsiveness [R40.4] 04/24/2015  . Bradycardia [R00.1] 04/24/2015  . Hypothermia [T68.XXXA] 04/24/2015  . Acute respiratory failure ([place]) [J96.00] 04/24/2015  . Acute diastolic CHF (congestive heart failure) ([place]) [I50.31]  04/05/2015  . Diabetic gastroparesis ([place]) [E11.43, K31.84] 04/05/2015  . Hypokalemia [E87.6] 04/05/2015  . Generalized weakness [R53.1] 04/05/2015  . Acute pulmonary edema ([name]) [J81.0] 04/03/2015  . Nausea and vomiting [R11.2] 04/03/2015  . Hypoglycemia associated with diabetes ([name]) [E11.649] 04/03/2015  . Anemia of chronic disease [D63.8] 04/03/2015  . Secondary hyperparathyroidism ([place]) [N25.81] 04/03/2015  . Pressure ulcer [L89.90] 04/02/2015  . Acute respiratory failure with hypoxia ([place]) [J96.01] 04/01/2015  . Adjustment disorder with anxiety [F43.22] 03/14/2015  . Somatic symptom disorder, mild [F45.8] 03/08/2015  . Coronary artery disease involving native coronary artery of native heart without angina pectoris [I25.10]   . Nausea & vomiting [R11.2] 03/06/2015  . Abdominal pain [R10.9] 03/06/2015  . DM (diabetes mellitus) ([name]) [E11.9] 03/06/2015  . HTN (hypertension) [I10] 03/06/2015  . Gastroparesis [K31.84] 02/24/2015  . Pleural effusion [J90] 02/19/2015  . HCAP (healthcare-associated pneumonia) [J18.9] 02/19/2015  . End-stage renal disease on hemodialysis ([name]) [N18.6, Z99.2] 02/19/2015    Total Time spent with patient: 20 minutes  Subjective:   [name] is a 55 y.o. female patient admitted with "that woman took my children away".  Follow-up Friday the 21st. Patient seen again. Continues to report that she is feeling well. Slept well last night. Patient is awake and alert and lucid. Affect upbeat. Good eye contact. Smiling. Not paranoid thoughts confuse. Having no particular side effects from current medicine. w.  HPI:  Patient interviewed. Chart reviewed. Labs and vitals reviewed. Patient known from previous hospital stay. 55 year old woman has a past history of major depression and recurrent mental health problems. She also has multiple serious chronic medical problems. She presented to the emergency room 3 times within about a 2 day period for physical  symptoms  including complaints of abdominal pain and mental status changes. Hospitalist evaluating her today felt that the patient's depression was severe. On interview today the patient is a little disorganized. Sometimes it is hard to keep her attention for the interview. She gets tangential and talks about how some woman took her children away from her. When asked who this was she says it is someone who says they are her daughter. Then she starts complaining about other family members. None of the story really makes sense. Patient says that her mood has been upset. She has not been sleeping or eating well. She has been feeling sick. She says she has been compliant with her medicine. Patient talks about hearing songs and voices in her head that are keeping her up all night long. She gets tearful very easily. Denies any suicidal thoughts.  Social history: Patient apparently is living with her son. She has multiple complaints that change from moment to moment about several members of the family. It's a little hard to follow exactly what she is complaining about.  Medical history: Multiple medical problems including end-stage renal disease on dialysis, diabetes hypertension history of coronary artery disease history of congestive heart failure  Past Psychiatric History: Patient is not a very reliable historian. Based on old notes it appears that the patient has had recurrent mental health issues. It is reported that she's had a couple of psychiatric hospitalizations in the past although they were years ago. Not clear if she is ever tried to harm herself. She denies it today. Had been seen by psychiatrists and maintained on antidepressants for years. Family reports that she has had periods of decompensation with confusion mostly when she is medically sick.  Risk to Self: Is patient at risk for suicide?: No Risk to Others:   Prior Inpatient Therapy:   Prior Outpatient Therapy:    Past Medical History:  Past Medical  History  Diagnosis Date  . Asthma   . Collagen vascular disease ([place])   . Diabetes mellitus without complication ([place])   . Hypertension   . Coronary artery disease     a. cath 2013: stenting to RCA (report not available); b. cath 2014: LM nl, pLAD 40%, mLAD nl, ost LCx 40%, mid LCx nl, pRCA 30% @ site of prior stent, mRCA 50%  . Diabetic neuropathy ([place])   . ESRD (end stage renal disease) on dialysis ([place])     M-W-F  . GERD (gastroesophageal reflux disease)   . Hx of pancreatitis 2015  . Myocardial infarction ([place])   . Pneumonia   . Mitral regurgitation     a. echo 10/2013: EF 62%, noWMA, mildly dilated LA, mild to mod MR/TR, GR1DD  . COPD (chronic obstructive pulmonary disease) ([place])   . chronic diastolic CHF 2/48/2500  . dialysis 2006  . Depression   . Anxiety   . Arthritis   . Headache   . Anemia   . Renal insufficiency     Past Surgical History  Procedure Laterality Date  . Cholecystectomy    . Appendectomy    . Abdominal hysterectomy    . Dialysis fistula creation Left     upper arm  . Esophagogastroduodenoscopy N/A 03/08/2015    Procedure: ESOPHAGOGASTRODUODENOSCOPY (EGD);  Surgeon: Manya Silvas, MD;  Location[facility] ENDOSCOPY;  Service: Endoscopy;  Laterality: N/A;  . Cardiac catheterization Left 07/26/2015    Procedure: Left Heart Cath and Coronary Angiography;  Surgeon: Dionisio David, MD;  Location: [place] CV LAB;  Service: Cardiovascular;  Laterality: Left;  Marland Kitchen Eye surgery    . Fecal transplant N/A 08/23/2015    Procedure: FECAL TRANSPLANT;  Surgeon: Manya Silvas, MD;  Location: [facility] ENDOSCOPY;  Service: Endoscopy;  Laterality: N/A;  . Peripheral vascular catheterization N/A 12/20/2015    Procedure: Thrombectomy of dialysis access versus permcath placement;  Surgeon: Algernon Huxley, MD;  Location: [facility];  Service: Cardiovascular;  Laterality: N/A;  . Peripheral vascular catheterization N/A 12/20/2015    Procedure: A/V Shunt Intervention;   Surgeon: Algernon Huxley, MD;  Location: [place] CV LAB;  Service: Cardiovascular;  Laterality: N/A;  . Peripheral vascular catheterization N/A 12/20/2015    Procedure: A/V Shuntogram/Fistulagram;  Surgeon: Algernon Huxley, MD;  Location: [place] CV LAB;  Service: Cardiovascular;  Laterality: N/A;  . Peripheral vascular catheterization N/A 01/02/2016    Procedure: A/V Shuntogram/Fistulagram;  Surgeon: Algernon Huxley, MD;  Location: [place] CV LAB;  Service: Cardiovascular;  Laterality: N/A;  . Peripheral vascular catheterization N/A 01/02/2016    Procedure: A/V Shunt Intervention;  Surgeon: Algernon Huxley, MD;  Location: [facility] LAB;  Service: Cardiovascular;  Laterality: N/A;  . Esophagogastroduodenoscopy (egd) with propofol N/A 03/18/2016    Procedure: ESOPHAGOGASTRODUODENOSCOPY (EGD) WITH PROPOFOL;  Surgeon: [name], MD;  Location: [facility] ENDOSCOPY;  Service: Endoscopy;  Laterality: N/A;   Family History:  Family History  Problem Relation Age of Onset  . Kidney disease Mother   . Diabetes Mother    Family Psychiatric  History: Unknown whether there is any family history Social History:  History  Alcohol Use No     History  Drug Use No    Social History   Social History  . Marital Status: Divorced    Spouse Name: N/A  . Number of Children: N/A  . Years of Education: N/A   Social History Main Topics  . Smoking status: Former Smoker -- 0.50 packs/day    Types: Cigarettes    Quit date: 02/13/2015  . Smokeless tobacco: Never Used  . Alcohol Use: No  . Drug Use: No  . Sexual Activity: Not Asked   Other Topics Concern  . None   Social History Narrative   Additional Social History:    Allergies:   Allergies  Allergen Reactions  . Compazine [Prochlorperazine Edisylate] Anaphylaxis and Nausea And Vomiting    23 Jul - patient relates that she takes promethazine frequently with no problems.  . Ace Inhibitors Swelling  . Ativan [Lorazepam] Other (See Comments)     Reaction:  Hallucinations and headaches  . Codeine Nausea And Vomiting  . Dilaudid [Hydromorphone Hcl] Other (See Comments)    Delirium  . Gabapentin Other (See Comments)    Reaction:  Unknown   . Losartan Other (See Comments)    Reaction:  Unknown   . Ondansetron Other (See Comments)    Reaction:  Unknown   . Prochlorperazine Other (See Comments)    Reaction:  Unknown   . Scopolamine Other (See Comments)    Reaction:  Unknown   . Zofran [Ondansetron Hcl] Other (See Comments)    Reaction:  hallucinations   . Oxycodone Anxiety  . Tape Rash    Labs:  Results for orders placed or performed during the hospital encounter of 04/08/16 (from the past 48 hour(s))  Glucose, capillary     Status: None   Collection Time: 04/09/16  6:09 PM  Result Value Ref Range   Glucose-Capillary 91 65 - 99 mg/dL  Glucose, capillary     Status: Abnormal   Collection Time: 04/09/16  9:18 PM  Result Value Ref Range   Glucose-Capillary 114 (H) 65 - 99 mg/dL  Glucose, capillary     Status: None   Collection Time: 04/10/16  7:52 AM  Result Value Ref Range   Glucose-Capillary 81 65 - 99 mg/dL  Urinalysis complete, with microscopic ([facility] only)     Status: Abnormal   Collection Time: 04/10/16 11:14 AM  Result Value Ref Range   Color, Urine YELLOW (A) YELLOW   APPearance CLEAR (A) CLEAR   Glucose, UA NEGATIVE NEGATIVE mg/dL   Bilirubin Urine NEGATIVE NEGATIVE   Ketones, ur NEGATIVE NEGATIVE mg/dL   Specific Gravity, Urine 1.009 1.005 - 1.030   Hgb urine dipstick NEGATIVE NEGATIVE   pH 9.0 (H) 5.0 - 8.0   Protein, ur 100 (A) NEGATIVE mg/dL   Nitrite NEGATIVE NEGATIVE   Leukocytes, UA NEGATIVE NEGATIVE   RBC / HPF 0-5 0 - 5 RBC/hpf   WBC, UA 0-5 0 - 5 WBC/hpf   Bacteria, UA NONE SEEN NONE SEEN   Squamous Epithelial / LPF NONE SEEN NONE SEEN  Glucose, capillary     Status: None   Collection Time: 04/10/16 11:39 AM  Result Value Ref Range   Glucose-Capillary 99 65 - 99 mg/dL  Glucose, capillary      Status: Abnormal   Collection Time: 04/10/16  4:09 PM  Result Value Ref Range   Glucose-Capillary 115 (H) 65 - 99 mg/dL  Glucose, capillary     Status: Abnormal   Collection Time: 04/10/16  9:09 PM  Result Value Ref Range   Glucose-Capillary 143 (H) 65 - 99 mg/dL  Glucose, capillary     Status: Abnormal   Collection Time: 04/11/16  7:30 AM  Result Value Ref Range   Glucose-Capillary 105 (H) 65 - 99 mg/dL  CBC     Status: Abnormal   Collection Time: 04/11/16 10:42 AM  Result Value Ref Range   WBC 4.2 3.6 - 11.0 K/uL   RBC 3.28 (L) 3.80 - 5.20 MIL/uL   Hemoglobin 8.0 (L) 12.0 - 16.0 g/dL   HCT 24.4 (L) 35.0 - 47.0 %   MCV 74.5 (L) 80.0 - 100.0 fL   MCH 24.3 (L) 26.0 - 34.0 pg   MCHC 32.5 32.0 - 36.0 g/dL   RDW 21.9 (H) 11.5 - 14.5 %   Platelets 167 150 - 440 K/uL  Renal function panel     Status: Abnormal   Collection Time: 04/11/16 10:43 AM  Result Value Ref Range   Sodium 131 (L) 135 - 145 mmol/L   Potassium 4.8 3.5 - 5.1 mmol/L   Chloride 96 (L) 101 - 111 mmol/L   CO2 30 22 - 32 mmol/L   Glucose, Bld 108 (H) 65 - 99 mg/dL   BUN 23 (H) 6 - 20 mg/dL   Creatinine, Ser 3.88 (H) 0.44 - 1.00 mg/dL   Calcium 8.4 (L) 8.9 - 10.3 mg/dL   Phosphorus 2.5 2.5 - 4.6 mg/dL   Albumin 2.7 (L) 3.5 - 5.0 g/dL   GFR calc non Af Amer 12 (L) >60 mL/min   GFR calc Af Amer 14 (L) >60 mL/min    Comment: (NOTE) The eGFR has been calculated using the CKD EPI equation. This calculation has not been validated in all clinical situations. eGFR's persistently <60 mL/min signify possible Chronic Kidney Disease.    Anion gap 5 5 - 15  Glucose, capillary  Status: None   Collection Time: 04/11/16  2:19 PM  Result Value Ref Range   Glucose-Capillary 88 65 - 99 mg/dL    Current Facility-Administered Medications  Medication Dose Route Frequency Provider Last Rate Last Dose  . 0.9 %  sodium chloride infusion  250 mL Intravenous PRN Theodoro Grist, MD      . acidophilus (RISAQUAD) capsule 1  capsule  1 capsule Oral Daily Theodoro Grist, MD   1 capsule at 04/11/16 1448  . albuterol (PROVENTIL) (2.5 MG/3ML) 0.083% nebulizer solution 2.5 mg  2.5 mg Nebulization Q4H PRN Theodoro Grist, MD      . amLODipine (NORVASC) tablet 10 mg  10 mg Oral Daily Theodoro Grist, MD   10 mg at 04/11/16 1448  . aspirin EC tablet 81 mg  81 mg Oral Hulen Skains, MD   81 mg at 04/11/16 0321  . atorvastatin (LIPITOR) tablet 20 mg  20 mg Oral QPM Theodoro Grist, MD   20 mg at 04/10/16 1734  . clopidogrel (PLAVIX) tablet 75 mg  75 mg Oral Daily Theodoro Grist, MD   75 mg at 04/11/16 1448  . famotidine (PEPCID) tablet 20 mg  20 mg Oral Daily Theodoro Grist, MD   20 mg at 04/11/16 1433  . FLUoxetine (PROZAC) capsule 20 mg  20 mg Oral Daily Gonzella Lex, MD   20 mg at 04/11/16 1432  . haloperidol lactate (HALDOL) injection 1 mg  1 mg Intravenous Q4H PRN Theodoro Grist, MD      . heparin injection 5,000 Units  5,000 Units Subcutaneous Q8H Theodoro Grist, MD   5,000 Units at 04/10/16 2223  . hydrALAZINE (APRESOLINE) tablet 25 mg  25 mg Oral TID Theodoro Grist, MD   25 mg at 04/11/16 1506  . HYDROcodone-acetaminophen (NORCO/VICODIN) 5-325 MG per tablet 1 tablet  1 tablet Oral Q6H PRN Gladstone Lighter, MD   1 tablet at 04/11/16 1056  . insulin aspart (novoLOG) injection 0-9 Units  0-9 Units Subcutaneous TID WC Theodoro Grist, MD   2 Units at 04/08/16 1741  . lactulose (CHRONULAC) 10 GM/15ML solution 30 g  30 g Oral BID PRN Theodoro Grist, MD      . lipase/protease/amylase (CREON) capsule 24,000 Units  24,000 Units Oral Q lunch Theodoro Grist, MD   24,000 Units at 04/11/16 1429  . lipase/protease/amylase (CREON) capsule 36,000 Units  36,000 Units Oral BID Theodoro Grist, MD   36,000 Units at 04/11/16 0831  . loratadine (CLARITIN) tablet 10 mg  10 mg Oral Daily Theodoro Grist, MD   10 mg at 04/11/16 1433  . multivitamin (RENA-VIT) tablet 1 tablet  1 tablet Oral Daily Theodoro Grist, MD   1 tablet at 04/11/16 1432  . OLANZapine  (ZYPREXA) tablet 2.5 mg  2.5 mg Oral QHS Gonzella Lex, MD   2.5 mg at 04/10/16 2223  . omega-3 acid ethyl esters (LOVAZA) capsule 1 g  1 g Oral Daily Theodoro Grist, MD   1 g at 04/11/16 1432  . pantoprazole (PROTONIX) EC tablet 40 mg  40 mg Oral BID AC Theodoro Grist, MD   40 mg at 04/11/16 0832  . pregabalin (LYRICA) capsule 25 mg  25 mg Oral Daily Theodoro Grist, MD   25 mg at 04/11/16 1428  . promethazine (PHENERGAN) tablet 12.5 mg  12.5 mg Oral Q8H PRN Theodoro Grist, MD      . promethazine (PHENERGAN) tablet 12.5 mg  12.5 mg Oral Q8H PRN Alexis Hugelmeyer, DO   12.5 mg at  04/11/16 0047  . ranolazine (RANEXA) 12 hr tablet 1,000 mg  1,000 mg Oral BID Theodoro Grist, MD   1,000 mg at 04/11/16 1430  . rifaximin (XIFAXAN) tablet 550 mg  550 mg Oral BID Theodoro Grist, MD   550 mg at 04/11/16 1429  . sevelamer carbonate (RENVELA) tablet 800 mg  800 mg Oral TID WC Theodoro Grist, MD   800 mg at 04/11/16 1428  . sodium chloride flush (NS) 0.9 % injection 3 mL  3 mL Intravenous Q12H Theodoro Grist, MD   3 mL at 04/10/16 2233  . sodium chloride flush (NS) 0.9 % injection 3 mL  3 mL Intravenous Q12H Theodoro Grist, MD   3 mL at 04/10/16 2225  . sodium chloride flush (NS) 0.9 % injection 3 mL  3 mL Intravenous PRN Theodoro Grist, MD      . vitamin B-12 (CYANOCOBALAMIN) tablet 1,000 mcg  1,000 mcg Oral Daily Theodoro Grist, MD   1,000 mcg at 04/11/16 1448    Musculoskeletal: Strength & Muscle Tone: decreased and atrophy Gait & Station: unsteady Patient leans: N/A  Psychiatric Specialty Exam: Physical Exam  Nursing note and vitals reviewed. Constitutional: She appears well-developed. No distress.  HENT:  Head: Normocephalic and atraumatic.  Eyes: Conjunctivae are normal. Pupils are equal, round, and reactive to light.  Neck: Normal range of motion.  Cardiovascular: Normal heart sounds.   Respiratory: No respiratory distress.  GI: Soft.  Musculoskeletal: Normal range of motion.  Neurological: She is  alert.  Skin: Skin is warm and dry.  Psychiatric: Her mood appears not anxious. Her affect is not labile. Her speech is not delayed and not tangential. She is not withdrawn. Thought content is not paranoid and not delusional. Cognition and memory are not impaired. She does not express impulsivity. She expresses no suicidal ideation. She is attentive.    Review of Systems  Constitutional: Negative.   HENT: Negative.   Eyes: Negative.   Respiratory: Negative for shortness of breath.   Cardiovascular: Positive for leg swelling.  Gastrointestinal: Negative for nausea, vomiting and abdominal pain.  Musculoskeletal: Negative.   Skin: Negative.   Neurological: Negative.   Psychiatric/Behavioral: Negative for depression, suicidal ideas, hallucinations, memory loss and substance abuse. The patient is not nervous/anxious and does not have insomnia.     Blood pressure 155/66, pulse 85, temperature 98 F (36.7 C), temperature source Oral, resp. rate 18, height _0  (1.651 m), weight 68.9 kg (151 lb 14.4 oz), SpO2 100 %.Body mass index is 25.28 kg/(m^2).  General Appearance: Casual  Eye Contact:  Good  Speech:  Clear and Coherent  Volume:  Decreased  Mood:  Euthymic  Affect:  Appropriate  Thought Process:  Coherent  Orientation:  Full (Time, Place, and Person)  Thought Content:  Logical  Suicidal Thoughts:  No  Homicidal Thoughts:  No  Memory:  Immediate;   Good Recent;   Fair Remote;   Fair  Judgement:  Fair  Insight:  Good  Psychomotor Activity:  Normal  Concentration:  Concentration: Fair  Recall:  Poor  Fund of Knowledge:  Good  Language:  Fair  Akathisia:  No  Handed:  Right  AIMS (if indicated):     Assets:  Housing Social Support  ADL's:  Intact  Cognition:  WNL  Sleep:        Treatment Plan Summary: Daily contact with patient to assess and evaluate symptoms and progress in treatment, Medication management and Plan Continues to be doing well. Patient should continue  on  her current medication with the changes made as they were in the hospital. She will follow-up with her outpatient provider. Reviewed plan with patient was agreeable.  Disposition: Supportive therapy provided about ongoing stressors. Discussed crisis plan, support from social network, calling 911, coming to the Emergency Department, and calling Suicide Hotline.  Alethia Berthold, MD 04/11/2016 4:17 PM

## 2016-04-11 NOTE — Progress Notes (Signed)
Subjective:  Patient known to our practice from outpatient dialysis. States that she is feeling better this morning. Continues to complain mild jerking movements, but overall improved. No reported N/V. No SOB, leg edema. HD is planned for today. UA results were negative for UTI.   Objective:  Vital signs in last 24 hours:  Temp:  [98.1 F (36.7 C)-98.5 F (36.9 C)] 98.5 F (36.9 C) (07/21 0517) Pulse Rate:  [63-77] 70 (07/21 0517) Resp:  [16-18] 17 (07/21 0517) BP: (122-136)/(49-57) 125/50 mmHg (07/21 0517) SpO2:  [97 %-100 %] 100 % (07/21 0517) Weight:  [70.1 kg (154 lb 8.7 oz)] 70.1 kg (154 lb 8.7 oz) (07/21 0500)  Weight change: 0.5 kg (1 lb 1.6 oz) Filed Weights   04/09/16 1721 04/10/16 0500 04/11/16 0500  Weight: 68.7 kg (151 lb 7.3 oz) 68.1 kg (150 lb 2.1 oz) 70.1 kg (154 lb 8.7 oz)    Intake/Output:    Intake/Output Summary (Last 24 hours) at 04/11/16 1009 Last data filed at 04/11/16 0200  Gross per 24 hour  Intake    340 ml  Output    650 ml  Net   -310 ml     Physical Exam: General: Appears comfortable and in no acute distress. Laying in bed.  HEENT Anicteric. Slight hearing impairment.   Neck Supple  Pulm/lungs Clear to auscultation bilaterally.  CVS/Heart Soft systolic murmur. Regular rhythm.   Abdomen:  Soft, non-tender.  Extremities: No peripheral edema.  Neurologic: Alert and able to answer questions appropriately.  Skin: No rashes.  Access: AV fistula.        Basic Metabolic Panel:   Recent Labs Lab 04/07/16 0146 04/07/16 1330 04/08/16 1032 04/09/16 0248  NA 128* 135 134* 133*  K 5.5* 3.2* 3.8 4.3  CL 88* 94* 97* 92*  CO2 28 33* 27 31  GLUCOSE 81 44* 83 119*  BUN 35* 11 18 23*  CREATININE 6.33* 2.78* 4.16* 4.78*  CALCIUM 8.5* 8.0* 8.3* 8.9  MG  --  2.2 2.4  --      CBC:  Recent Labs Lab 04/07/16 0146 04/07/16 1330 04/08/16 0942 04/09/16 0248  WBC 5.7 3.2* 6.7 8.7  NEUTROABS  --  1.6  --   --   HGB 9.4* 9.2* 9.5* 9.8*   HCT 28.0* 28.7* 29.6* 30.0*  MCV 74.3* 75.1* 75.0* 75.4*  PLT 211 197 205 216      Microbiology:  Recent Results (from the past 720 hour(s))  Gastrointestinal Panel by PCR , Stool     Status: None   Collection Time: 03/16/16  2:25 PM  Result Value Ref Range Status   Campylobacter species NOT DETECTED NOT DETECTED Final   Plesimonas shigelloides NOT DETECTED NOT DETECTED Final   Salmonella species NOT DETECTED NOT DETECTED Final   Yersinia enterocolitica NOT DETECTED NOT DETECTED Final   Vibrio species NOT DETECTED NOT DETECTED Final   Vibrio cholerae NOT DETECTED NOT DETECTED Final   Enteroaggregative E coli (EAEC) NOT DETECTED NOT DETECTED Final   Enteropathogenic E coli (EPEC) NOT DETECTED NOT DETECTED Final   Enterotoxigenic E coli (ETEC) NOT DETECTED NOT DETECTED Final   Shiga like toxin producing E coli (STEC) NOT DETECTED NOT DETECTED Final   E. coli O157 NOT DETECTED NOT DETECTED Final   Shigella/Enteroinvasive E coli (EIEC) NOT DETECTED NOT DETECTED Final   Cryptosporidium NOT DETECTED NOT DETECTED Final   Cyclospora cayetanensis NOT DETECTED NOT DETECTED Final   Entamoeba histolytica NOT DETECTED NOT DETECTED Final  Giardia lamblia NOT DETECTED NOT DETECTED Final   Adenovirus F40/41 NOT DETECTED NOT DETECTED Final   Astrovirus NOT DETECTED NOT DETECTED Final   Norovirus GI/GII NOT DETECTED NOT DETECTED Final   Rotavirus A NOT DETECTED NOT DETECTED Final   Sapovirus (I, II, IV, and V) NOT DETECTED NOT DETECTED Final  C difficile quick scan w PCR reflex     Status: Abnormal   Collection Time: 03/16/16  2:25 PM  Result Value Ref Range Status   C Diff antigen POSITIVE (A) NEGATIVE Final   C Diff toxin NEGATIVE NEGATIVE Final   C Diff interpretation   Final    Positive for toxigenic C. difficile, active toxin production not detected. Patient has toxigenic C. difficile organisms present in the bowel, but toxin was not detected. The patient may be a carrier or the level  of toxin in the sample was below the limit  of detection. This information should be used in conjunction with the patient's clinical history when deciding on possible therapy.     Comment: CRITICAL RESULT CALLED TO, READ BACK BY AND VERIFIED WITH: Daleen Snook @ [id number] 03/16/16 by [facility]   Clostridium Difficile by PCR     Status: Abnormal   Collection Time: 03/16/16  2:25 PM  Result Value Ref Range Status   Toxigenic C Difficile by pcr POSITIVE (A) NEGATIVE Final    Comment: CRITICAL RESULT CALLED TO, READ BACK BY AND VERIFIED WITH: Daleen Snook @ [id number] 03/16/16 by [facility]   MRSA PCR Screening     Status: None   Collection Time: 03/18/16 10:46 AM  Result Value Ref Range Status   MRSA by PCR NEGATIVE NEGATIVE Final    Comment:        The GeneXpert MRSA Assay (FDA approved for NASAL specimens only), is one component of a comprehensive MRSA colonization surveillance program. It is not intended to diagnose MRSA infection nor to guide or monitor treatment for MRSA infections.   Blood culture (routine x 2)     Status: None (Preliminary result)   Collection Time: 04/08/16  8:06 AM  Result Value Ref Range Status   Specimen Description BLOOD  Final   Special Requests BOTTLES DRAWN AEROBIC AND ANAEROBIC [place]  Final   Culture NO GROWTH 3 DAYS  Final   Report Status PENDING  Incomplete    Coagulation Studies: No results for input(s): LABPROT, INR in the last 72 hours.  Urinalysis:  Recent Labs  04/10/16 1114  COLORURINE YELLOW*  LABSPEC 1.009  PHURINE 9.0*  GLUCOSEU NEGATIVE  HGBUR NEGATIVE  BILIRUBINUR NEGATIVE  KETONESUR NEGATIVE  PROTEINUR 100*  NITRITE NEGATIVE  LEUKOCYTESUR NEGATIVE      Imaging: No results found.   Medications:     . acidophilus  1 capsule Oral Daily  . amLODipine  10 mg Oral Daily  . aspirin EC  81 mg Oral BH-q7a  . atorvastatin  20 mg Oral QPM  . clopidogrel  75 mg Oral Daily  . famotidine  20 mg Oral Daily  . FLUoxetine  20 mg  Oral Daily  . heparin  5,000 Units Subcutaneous Q8H  . hydrALAZINE  25 mg Oral TID  . insulin aspart  0-9 Units Subcutaneous TID WC  . lipase/protease/amylase  24,000 Units Oral Q lunch  . lipase/protease/amylase  36,000 Units Oral BID  . loratadine  10 mg Oral Daily  . multivitamin  1 tablet Oral Daily  . OLANZapine  2.5 mg Oral QHS  . omega-3 acid ethyl esters  1 g Oral Daily  . pantoprazole  40 mg Oral BID AC  . pregabalin  25 mg Oral Daily  . ranolazine  1,000 mg Oral BID  . rifaximin  550 mg Oral BID  . sevelamer carbonate  800 mg Oral TID WC  . sodium chloride flush  3 mL Intravenous Q12H  . sodium chloride flush  3 mL Intravenous Q12H  . vitamin B-12  1,000 mcg Oral Daily   sodium chloride, albuterol, haloperidol lactate, HYDROcodone-acetaminophen, lactulose, promethazine, promethazine, sodium chloride flush  Assessment/ Plan:  55 y.o. female with insulin dependent diabetes, hypertension, hyperlipidemia, diabetic retinopathy, diabetic neuropathy, diabetic gastroparesis, pancreatic insufficiency, depression, GERD. Renal masses - avoiding epo   MWF [facility] [address]   1. End Stage Renal Disease - Continue MWF schedule.  - Dialysis planned for today.  2. Anemia of chronic kidney disease: hemoglobin 9.8 - Not getting erythropoeitin stimulating agent as outpatient due to renal masses. However CT from 6/24 does not seem to show any renal masses.   3. Secondary Hyperparathyroidism: will monitor phosphorus.  - Continue sevelamer     LOS: 3 Obryan Radu 7/21/201710:09 AM

## 2016-04-11 NOTE — Progress Notes (Signed)
Pre HD assessment  

## 2016-04-11 NOTE — Progress Notes (Signed)
Pre HD tx

## 2016-04-11 NOTE — Progress Notes (Signed)
HD COMPLETED  

## 2016-04-11 NOTE — Progress Notes (Signed)
Alert and oriented. Vital signs stable . No signs of acute distress. Discharge instructions given. Patient verbalizes understanding. No other issues noted at this time.   

## 2016-04-11 NOTE — Progress Notes (Signed)
POST DIALYSIS ASSESSMENT 

## 2016-04-11 NOTE — Care Management (Signed)
Patient to discharge home today after dialysis. Cheryl with [facility] notified.  Patient states that she does not have a potable O2 tank for discharge.  Jason with [facility] to deliver one to room. RNCM signing off

## 2016-04-12 LAB — URINE CULTURE: Culture: NO GROWTH

## 2016-04-12 LAB — HEPATITIS B SURFACE ANTIGEN: Hepatitis B Surface Ag: NEGATIVE

## 2016-04-13 LAB — CULTURE, BLOOD (ROUTINE X 2): Culture: NO GROWTH

## 2016-04-21 ENCOUNTER — Emergency Department: Payer: Medicare Other

## 2016-04-21 ENCOUNTER — Inpatient Hospital Stay
Admission: EM | Admit: 2016-04-21 | Discharge: 2016-04-29 | DRG: 689 | Disposition: A | Discharge status: Skilled Nursing Facility | Payer: Medicare Other | Attending: Internal Medicine | Admitting: Internal Medicine

## 2016-04-21 ENCOUNTER — Inpatient Hospital Stay (HOSPITAL_COMMUNITY)
Admit: 2016-04-21 | Discharge: 2016-04-21 | Disposition: A | Discharge status: Home / Self Care | Payer: Medicare Other | Attending: Internal Medicine | Admitting: Internal Medicine

## 2016-04-21 ENCOUNTER — Encounter: Payer: Self-pay | Admitting: Emergency Medicine

## 2016-04-21 DIAGNOSIS — E1140 Type 2 diabetes mellitus with diabetic neuropathy, unspecified: Secondary | ICD-10-CM | POA: Diagnosis present

## 2016-04-21 DIAGNOSIS — E785 Hyperlipidemia, unspecified: Secondary | ICD-10-CM | POA: Diagnosis present

## 2016-04-21 DIAGNOSIS — J449 Chronic obstructive pulmonary disease, unspecified: Secondary | ICD-10-CM | POA: Diagnosis present

## 2016-04-21 DIAGNOSIS — E11319 Type 2 diabetes mellitus with unspecified diabetic retinopathy without macular edema: Secondary | ICD-10-CM | POA: Diagnosis present

## 2016-04-21 DIAGNOSIS — I2510 Atherosclerotic heart disease of native coronary artery without angina pectoris: Secondary | ICD-10-CM | POA: Diagnosis present

## 2016-04-21 DIAGNOSIS — K210 Gastro-esophageal reflux disease with esophagitis: Secondary | ICD-10-CM | POA: Diagnosis present

## 2016-04-21 DIAGNOSIS — Z992 Dependence on renal dialysis: Secondary | ICD-10-CM | POA: Diagnosis not present

## 2016-04-21 DIAGNOSIS — I132 Hypertensive heart and chronic kidney disease with heart failure and with stage 5 chronic kidney disease, or end stage renal disease: Secondary | ICD-10-CM | POA: Diagnosis present

## 2016-04-21 DIAGNOSIS — Z7982 Long term (current) use of aspirin: Secondary | ICD-10-CM

## 2016-04-21 DIAGNOSIS — E11649 Type 2 diabetes mellitus with hypoglycemia without coma: Secondary | ICD-10-CM | POA: Diagnosis present

## 2016-04-21 DIAGNOSIS — E871 Hypo-osmolality and hyponatremia: Secondary | ICD-10-CM | POA: Diagnosis present

## 2016-04-21 DIAGNOSIS — R202 Paresthesia of skin: Secondary | ICD-10-CM

## 2016-04-21 DIAGNOSIS — R531 Weakness: Secondary | ICD-10-CM

## 2016-04-21 DIAGNOSIS — E1122 Type 2 diabetes mellitus with diabetic chronic kidney disease: Secondary | ICD-10-CM | POA: Diagnosis present

## 2016-04-21 DIAGNOSIS — R197 Diarrhea, unspecified: Secondary | ICD-10-CM

## 2016-04-21 DIAGNOSIS — R0902 Hypoxemia: Secondary | ICD-10-CM | POA: Diagnosis present

## 2016-04-21 DIAGNOSIS — I4581 Long QT syndrome: Secondary | ICD-10-CM | POA: Diagnosis not present

## 2016-04-21 DIAGNOSIS — N3000 Acute cystitis without hematuria: Secondary | ICD-10-CM | POA: Diagnosis present

## 2016-04-21 DIAGNOSIS — G9340 Encephalopathy, unspecified: Secondary | ICD-10-CM | POA: Diagnosis present

## 2016-04-21 DIAGNOSIS — I700 Atherosclerosis of aorta: Secondary | ICD-10-CM | POA: Diagnosis present

## 2016-04-21 DIAGNOSIS — G459 Transient cerebral ischemic attack, unspecified: Secondary | ICD-10-CM | POA: Diagnosis present

## 2016-04-21 DIAGNOSIS — Z833 Family history of diabetes mellitus: Secondary | ICD-10-CM

## 2016-04-21 DIAGNOSIS — D631 Anemia in chronic kidney disease: Secondary | ICD-10-CM | POA: Diagnosis present

## 2016-04-21 DIAGNOSIS — I5032 Chronic diastolic (congestive) heart failure: Secondary | ICD-10-CM | POA: Diagnosis not present

## 2016-04-21 DIAGNOSIS — Z87891 Personal history of nicotine dependence: Secondary | ICD-10-CM | POA: Diagnosis not present

## 2016-04-21 DIAGNOSIS — N2581 Secondary hyperparathyroidism of renal origin: Secondary | ICD-10-CM | POA: Diagnosis present

## 2016-04-21 DIAGNOSIS — I5033 Acute on chronic diastolic (congestive) heart failure: Secondary | ICD-10-CM | POA: Diagnosis present

## 2016-04-21 DIAGNOSIS — B964 Proteus (mirabilis) (morganii) as the cause of diseases classified elsewhere: Secondary | ICD-10-CM | POA: Diagnosis present

## 2016-04-21 DIAGNOSIS — Z841 Family history of disorders of kidney and ureter: Secondary | ICD-10-CM | POA: Diagnosis not present

## 2016-04-21 DIAGNOSIS — R0602 Shortness of breath: Secondary | ICD-10-CM

## 2016-04-21 DIAGNOSIS — R112 Nausea with vomiting, unspecified: Secondary | ICD-10-CM

## 2016-04-21 DIAGNOSIS — N186 End stage renal disease: Secondary | ICD-10-CM | POA: Diagnosis present

## 2016-04-21 DIAGNOSIS — I252 Old myocardial infarction: Secondary | ICD-10-CM

## 2016-04-21 DIAGNOSIS — F333 Major depressive disorder, recurrent, severe with psychotic symptoms: Secondary | ICD-10-CM | POA: Diagnosis present

## 2016-04-21 DIAGNOSIS — F331 Major depressive disorder, recurrent, moderate: Secondary | ICD-10-CM | POA: Diagnosis not present

## 2016-04-21 DIAGNOSIS — Z888 Allergy status to other drugs, medicaments and biological substances status: Secondary | ICD-10-CM

## 2016-04-21 DIAGNOSIS — N390 Urinary tract infection, site not specified: Secondary | ICD-10-CM

## 2016-04-21 DIAGNOSIS — Z7902 Long term (current) use of antithrombotics/antiplatelets: Secondary | ICD-10-CM

## 2016-04-21 DIAGNOSIS — Z885 Allergy status to narcotic agent status: Secondary | ICD-10-CM

## 2016-04-21 DIAGNOSIS — Z794 Long term (current) use of insulin: Secondary | ICD-10-CM

## 2016-04-21 LAB — COMPREHENSIVE METABOLIC PANEL
ALT: 11 U/L — ABNORMAL LOW (ref 14–54)
AST: 22 U/L (ref 15–41)
Albumin: 3.4 g/dL — ABNORMAL LOW (ref 3.5–5.0)
Alkaline Phosphatase: 102 U/L (ref 38–126)
Anion gap: 12 (ref 5–15)
BUN: 21 mg/dL — ABNORMAL HIGH (ref 6–20)
CO2: 28 mmol/L (ref 22–32)
Calcium: 9 mg/dL (ref 8.9–10.3)
Chloride: 92 mmol/L — ABNORMAL LOW (ref 101–111)
Creatinine, Ser: 4.97 mg/dL — ABNORMAL HIGH (ref 0.44–1.00)
GFR calc Af Amer: 10 mL/min — ABNORMAL LOW (ref >60)
GFR calc non Af Amer: 9 mL/min — ABNORMAL LOW (ref >60)
Glucose, Bld: 85 mg/dL (ref 65–99)
Potassium: 4.4 mmol/L (ref 3.5–5.1)
Sodium: 132 mmol/L — ABNORMAL LOW (ref 135–145)
Total Bilirubin: 0.6 mg/dL (ref 0.3–1.2)
Total Protein: 8.4 g/dL — ABNORMAL HIGH (ref 6.5–8.1)

## 2016-04-21 LAB — CBC
HCT: 28.3 % — ABNORMAL LOW (ref 35.0–47.0)
HCT: 28 % — ABNORMAL LOW (ref 35.0–47.0)
Hemoglobin: 9.3 g/dL — ABNORMAL LOW (ref 12.0–16.0)
Hemoglobin: 9.3 g/dL — ABNORMAL LOW (ref 12.0–16.0)
MCH: 24.2 pg — ABNORMAL LOW (ref 26.0–34.0)
MCH: 24.4 pg — ABNORMAL LOW (ref 26.0–34.0)
MCHC: 32.6 g/dL (ref 32.0–36.0)
MCHC: 33.2 g/dL (ref 32.0–36.0)
MCV: 74.2 fL — ABNORMAL LOW (ref 80.0–100.0)
MCV: 73.5 fL — ABNORMAL LOW (ref 80.0–100.0)
Platelets: 257 10*3/uL (ref 150–440)
Platelets: 254 10*3/uL (ref 150–440)
RBC: 3.82 MIL/uL (ref 3.80–5.20)
RBC: 3.81 MIL/uL (ref 3.80–5.20)
RDW: 22 % — ABNORMAL HIGH (ref 11.5–14.5)
RDW: 22.9 % — ABNORMAL HIGH (ref 11.5–14.5)
WBC: 7.1 10*3/uL (ref 3.6–11.0)
WBC: 6.9 10*3/uL (ref 3.6–11.0)

## 2016-04-21 LAB — APTT: aPTT: 46 seconds — ABNORMAL HIGH (ref 24–36)

## 2016-04-21 LAB — GLUCOSE, CAPILLARY
Glucose-Capillary: 74 mg/dL (ref 65–99)
Glucose-Capillary: 65 mg/dL (ref 65–99)
Glucose-Capillary: 50 mg/dL — ABNORMAL LOW (ref 65–99)
Glucose-Capillary: 62 mg/dL — ABNORMAL LOW (ref 65–99)

## 2016-04-21 LAB — URINALYSIS COMPLETE WITH MICROSCOPIC (ARMC ONLY)
Bilirubin Urine: NEGATIVE
Glucose, UA: NEGATIVE mg/dL
Ketones, ur: NEGATIVE mg/dL
Nitrite: NEGATIVE
Protein, ur: 100 mg/dL — AB
Specific Gravity, Urine: 1.01 (ref 1.005–1.030)
pH: 8 (ref 5.0–8.0)

## 2016-04-21 LAB — URINE DRUG SCREEN, QUALITATIVE (ARMC ONLY)
Amphetamines, Ur Screen: NOT DETECTED
Barbiturates, Ur Screen: NOT DETECTED
Benzodiazepine, Ur Scrn: NOT DETECTED
Cannabinoid 50 Ng, Ur ~~LOC~~: NOT DETECTED
Cocaine Metabolite,Ur ~~LOC~~: NOT DETECTED
MDMA (Ecstasy)Ur Screen: NOT DETECTED
Methadone Scn, Ur: NOT DETECTED
Opiate, Ur Screen: POSITIVE — AB
Phencyclidine (PCP) Ur S: NOT DETECTED
Tricyclic, Ur Screen: NOT DETECTED

## 2016-04-21 LAB — CREATININE, SERUM
Creatinine, Ser: 5.31 mg/dL — ABNORMAL HIGH (ref 0.44–1.00)
GFR calc Af Amer: 10 mL/min — ABNORMAL LOW (ref >60)
GFR calc non Af Amer: 8 mL/min — ABNORMAL LOW (ref >60)

## 2016-04-21 LAB — MRSA PCR SCREENING: MRSA by PCR: NEGATIVE

## 2016-04-21 LAB — DIFFERENTIAL
Basophils Absolute: 0.1 10*3/uL (ref 0–0.1)
Basophils Relative: 1 %
Eosinophils Absolute: 0.5 10*3/uL (ref 0–0.7)
Eosinophils Relative: 7 %
Lymphocytes Relative: 25 %
Lymphs Abs: 1.8 10*3/uL (ref 1.0–3.6)
Monocytes Absolute: 0.4 10*3/uL (ref 0.2–0.9)
Monocytes Relative: 6 %
Neutro Abs: 4.3 10*3/uL (ref 1.4–6.5)
Neutrophils Relative %: 61 %

## 2016-04-21 LAB — AMMONIA: Ammonia: 27 umol/L (ref 9–35)

## 2016-04-21 LAB — PROTIME-INR
INR: 1.08
Prothrombin Time: 14 seconds (ref 11.4–15.2)

## 2016-04-21 LAB — LIPASE, BLOOD: Lipase: <10 U/L — ABNORMAL LOW (ref 11–51)

## 2016-04-21 LAB — ETHANOL: Alcohol, Ethyl (B): <5 mg/dL (ref <5)

## 2016-04-21 MED ORDER — STROKE: EARLY STAGES OF RECOVERY BOOK
Freq: Once | Status: AC
  Administered 2016-04-21: 18:00:00

## 2016-04-21 MED ORDER — FAMOTIDINE 20 MG PO TABS
10.0000 mg | ORAL_TABLET | Freq: Two times a day (BID) | ORAL | Status: DC
  Administered 2016-04-21: 10 mg via ORAL
  Filled 2016-04-21: qty 1

## 2016-04-21 MED ORDER — PREGABALIN 25 MG PO CAPS
25.0000 mg | ORAL_CAPSULE | Freq: Every day | ORAL | Status: DC
  Administered 2016-04-21 – 2016-04-29 (×8): 25 mg via ORAL
  Filled 2016-04-21 (×8): qty 1

## 2016-04-21 MED ORDER — HYDROCODONE-ACETAMINOPHEN 5-325 MG PO TABS
1.0000 | ORAL_TABLET | Freq: Four times a day (QID) | ORAL | Status: DC | PRN
  Administered 2016-04-21 – 2016-04-29 (×10): 1 via ORAL
  Filled 2016-04-21 (×11): qty 1

## 2016-04-21 MED ORDER — RANOLAZINE ER 500 MG PO TB12
1000.0000 mg | ORAL_TABLET | Freq: Two times a day (BID) | ORAL | Status: DC
  Administered 2016-04-21 – 2016-04-24 (×4): 1000 mg via ORAL
  Filled 2016-04-21 (×4): qty 2

## 2016-04-21 MED ORDER — PANCRELIPASE (LIP-PROT-AMYL) 12000-38000 UNITS PO CPEP
24000.0000 [IU] | ORAL_CAPSULE | Freq: Every day | ORAL | Status: DC
Start: 2016-04-22 — End: 2016-04-29
  Administered 2016-04-22 – 2016-04-29 (×7): 24000 [IU] via ORAL
  Filled 2016-04-21 (×7): qty 2

## 2016-04-21 MED ORDER — HYDRALAZINE HCL 25 MG PO TABS
25.0000 mg | ORAL_TABLET | Freq: Three times a day (TID) | ORAL | Status: DC
  Administered 2016-04-21 – 2016-04-29 (×20): 25 mg via ORAL
  Filled 2016-04-21 (×20): qty 1

## 2016-04-21 MED ORDER — HEPARIN SODIUM (PORCINE) 5000 UNIT/ML IJ SOLN
5000.0000 [IU] | Freq: Two times a day (BID) | INTRAMUSCULAR | Status: DC
  Administered 2016-04-21 – 2016-04-26 (×9): 5000 [IU] via SUBCUTANEOUS
  Filled 2016-04-21 (×9): qty 1

## 2016-04-21 MED ORDER — CLOPIDOGREL BISULFATE 75 MG PO TABS
75.0000 mg | ORAL_TABLET | Freq: Every day | ORAL | Status: DC
  Administered 2016-04-21 – 2016-04-26 (×6): 75 mg via ORAL
  Filled 2016-04-21 (×6): qty 1

## 2016-04-21 MED ORDER — ENOXAPARIN SODIUM 30 MG/0.3ML ~~LOC~~ SOLN: 30.0000 mg | SUBCUTANEOUS | Status: DC

## 2016-04-21 MED ORDER — ALBUTEROL SULFATE (2.5 MG/3ML) 0.083% IN NEBU
2.5000 mg | INHALATION_SOLUTION | RESPIRATORY_TRACT | Status: DC | PRN
  Administered 2016-04-23 (×3): 2.5 mg via RESPIRATORY_TRACT
  Filled 2016-04-21 (×3): qty 3

## 2016-04-21 MED ORDER — PANCRELIPASE (LIP-PROT-AMYL) 12000-38000 UNITS PO CPEP
36000.0000 [IU] | ORAL_CAPSULE | Freq: Two times a day (BID) | ORAL | Status: DC
  Administered 2016-04-21 – 2016-04-29 (×15): 36000 [IU] via ORAL
  Filled 2016-04-21 (×15): qty 3

## 2016-04-21 MED ORDER — HYDROCODONE-ACETAMINOPHEN 5-325 MG PO TABS
1.0000 | ORAL_TABLET | Freq: Once | ORAL | Status: AC
  Administered 2016-04-21: 1 via ORAL

## 2016-04-21 MED ORDER — ONDANSETRON HCL 4 MG/2ML IJ SOLN
4.0000 mg | Freq: Four times a day (QID) | INTRAMUSCULAR | Status: DC | PRN
  Filled 2016-04-21: qty 2

## 2016-04-21 MED ORDER — ATORVASTATIN CALCIUM 20 MG PO TABS
20.0000 mg | ORAL_TABLET | Freq: Every evening | ORAL | Status: DC
  Administered 2016-04-21 – 2016-04-29 (×8): 20 mg via ORAL
  Filled 2016-04-21 (×8): qty 1

## 2016-04-21 MED ORDER — CEFTRIAXONE SODIUM 1 G IJ SOLR
1.0000 g | INTRAMUSCULAR | Status: DC
  Administered 2016-04-22 – 2016-04-23 (×2): 1 g via INTRAVENOUS
  Filled 2016-04-21 (×3): qty 10

## 2016-04-21 MED ORDER — LORATADINE 10 MG PO TABS
10.0000 mg | ORAL_TABLET | Freq: Every day | ORAL | Status: DC
Start: 2016-04-21 — End: 2016-04-29
  Administered 2016-04-21 – 2016-04-29 (×7): 10 mg via ORAL
  Filled 2016-04-21 (×7): qty 1

## 2016-04-21 MED ORDER — PANTOPRAZOLE SODIUM 40 MG PO TBEC
40.0000 mg | DELAYED_RELEASE_TABLET | Freq: Two times a day (BID) | ORAL | Status: DC
  Administered 2016-04-21 – 2016-04-29 (×16): 40 mg via ORAL
  Filled 2016-04-21 (×16): qty 1

## 2016-04-21 MED ORDER — DEXTROSE 5 % IV SOLN
1.0000 g | Freq: Once | INTRAVENOUS | Status: DC
  Filled 2016-04-21: qty 10

## 2016-04-21 MED ORDER — ASPIRIN EC 81 MG PO TBEC
81.0000 mg | DELAYED_RELEASE_TABLET | ORAL | Status: DC
  Administered 2016-04-22 – 2016-04-26 (×4): 81 mg via ORAL
  Filled 2016-04-21 (×4): qty 1

## 2016-04-21 MED ORDER — FLUOXETINE HCL 20 MG PO CAPS
20.0000 mg | ORAL_CAPSULE | Freq: Every day | ORAL | Status: DC
  Administered 2016-04-21 – 2016-04-29 (×8): 20 mg via ORAL
  Filled 2016-04-21 (×8): qty 1

## 2016-04-21 MED ORDER — OLANZAPINE 2.5 MG PO TABS
2.5000 mg | ORAL_TABLET | Freq: Every day | ORAL | Status: DC
  Filled 2016-04-21: qty 1

## 2016-04-21 MED ORDER — DEXTROSE 50 % IV SOLN
INTRAVENOUS | Status: AC
  Administered 2016-04-21: 22:00:00 50 mL
  Filled 2016-04-21: qty 50

## 2016-04-21 MED ORDER — SEVELAMER CARBONATE 800 MG PO TABS
800.0000 mg | ORAL_TABLET | Freq: Three times a day (TID) | ORAL | Status: DC
  Administered 2016-04-21 – 2016-04-29 (×22): 800 mg via ORAL
  Filled 2016-04-21 (×22): qty 1

## 2016-04-21 MED ORDER — RIFAXIMIN 550 MG PO TABS
550.0000 mg | ORAL_TABLET | Freq: Two times a day (BID) | ORAL | Status: DC
  Administered 2016-04-21 – 2016-04-29 (×15): 550 mg via ORAL
  Filled 2016-04-21 (×15): qty 1

## 2016-04-21 MED ORDER — AMLODIPINE BESYLATE 10 MG PO TABS
10.0000 mg | ORAL_TABLET | Freq: Every day | ORAL | Status: DC
  Administered 2016-04-21 – 2016-04-29 (×7): 10 mg via ORAL
  Filled 2016-04-21 (×7): qty 1

## 2016-04-21 MED ORDER — METOCLOPRAMIDE HCL 5 MG/ML IJ SOLN
5.0000 mg | Freq: Four times a day (QID) | INTRAMUSCULAR | Status: DC | PRN
Start: 2016-04-21 — End: 2016-04-22
  Filled 2016-04-21: qty 2

## 2016-04-21 MED ORDER — RISAQUAD PO CAPS
1.0000 | ORAL_CAPSULE | Freq: Every day | ORAL | Status: DC
  Administered 2016-04-21 – 2016-04-29 (×7): 1 via ORAL
  Filled 2016-04-21 (×7): qty 1

## 2016-04-21 MED ORDER — ALBUTEROL SULFATE (2.5 MG/3ML) 0.083% IN NEBU: 2.5000 mg | INHALATION_SOLUTION | RESPIRATORY_TRACT | Status: DC | PRN

## 2016-04-21 MED ORDER — DIATRIZOATE MEGLUMINE & SODIUM 66-10 % PO SOLN
15.0000 mL | ORAL | Status: AC
  Administered 2016-04-21: 30 mL via ORAL

## 2016-04-21 MED ORDER — OLANZAPINE 2.5 MG PO TABS
5.0000 mg | ORAL_TABLET | Freq: Every day | ORAL | Status: DC
  Administered 2016-04-21 – 2016-04-29 (×8): 5 mg via ORAL
  Filled 2016-04-21 (×8): qty 2

## 2016-04-21 NOTE — ED Notes (Signed)
Stroke nurse comments: unable to determine a LKW for patient. Pt states she has had some worsening weakness over the past two days.  Pt sent to ED from dialysis for Rt sided abdominal pain, N/V.  While the pt was waiting for a room, the pt states she developed rt sided weakness (approx 0745 - pt unsure of exact time).  EDP called code stroke due to reported new onset of weakness.  [facility] neurologist Dr. Rip Harbour feels that the onset was greater than 8 hrs.  Mateo Flow RN updated on pt status.

## 2016-04-21 NOTE — Consult Note (Signed)
[facility] Face-to-Face Psychiatry Consult   Reason for Consult:  Consult for this 55 year old woman with a history of recurrent depression and multiple medical problems who returns to the hospital with a possible stroke Referring Physician:  Gouru Patient Identification: [name] MRN:  [id number] Principal Diagnosis: Depression, major, recurrent, severe with psychosis ([place]) Diagnosis:   Patient Active Problem List   Diagnosis Date Noted  . TIA (transient ischemic attack) [G45.9] 04/21/2016  . Altered mental status [R41.82] 04/08/2016  . Hyperammonemia ([place]) [E72.20] 04/08/2016  . Elevated troponin [R79.89] 04/08/2016  . ESRD (end stage renal disease) ([place]) [N18.6] 04/08/2016  . Depression [F32.9] 04/08/2016  . Depression, major, recurrent, severe with psychosis ([place]) [F33.3] 04/08/2016  . Blood in stool [K92.1]   . Intractable cyclical vomiting with nausea [G43.A1]   . Reflux esophagitis [K21.0]   . Gastritis [K29.70]   . Generalized abdominal pain [R10.84]   . Uncontrollable vomiting [R11.10]   . Major depressive disorder, recurrent episode, moderate ([place]) [F33.1] 03/15/2016  . Adjustment disorder with mixed anxiety and depressed mood [F43.23] 03/15/2016  . Malnutrition of moderate degree [E44.0] 12/01/2015  . Renal mass [N28.89]   . Dyspnea [R06.00]   . Acute renal failure ([place]) [N17.9]   . Respiratory failure ([place]) [J96.90]   . High temperature [R50.9] 11/14/2015  . Pulmonary edema [J81.1]   . Encounter for central line placement [Z45.2]   . Encounter for orogastric (OG) tube placement [Z46.59]   . Nausea [R11.0] 11/12/2015  . Hyperkalemia [E87.5] 10/03/2015  . C. difficile diarrhea [A04.7] 07/22/2015  . Pneumonia [J18.9] 05/21/2015  . Hypoglycemia [E16.2] 04/24/2015  . Unresponsiveness [R40.4] 04/24/2015  . Bradycardia [R00.1] 04/24/2015  . Hypothermia [T68.XXXA] 04/24/2015  . Acute respiratory failure ([place]) [J96.00] 04/24/2015  . Acute diastolic CHF (congestive heart  failure) ([place]) [I50.31] 04/05/2015  . Diabetic gastroparesis ([place]) [E11.43, K31.84] 04/05/2015  . Hypokalemia [E87.6] 04/05/2015  . Generalized weakness [R53.1] 04/05/2015  . Acute pulmonary edema ([place]) [J81.0] 04/03/2015  . Nausea and vomiting [R11.2] 04/03/2015  . Hypoglycemia associated with diabetes ([place]) [E11.649] 04/03/2015  . Anemia of chronic disease [D63.8] 04/03/2015  . Secondary hyperparathyroidism ([place]) [N25.81] 04/03/2015  . Pressure ulcer [L89.90] 04/02/2015  . Acute respiratory failure with hypoxia ([place]) [J96.01] 04/01/2015  . Adjustment disorder with anxiety [F43.22] 03/14/2015  . Somatic symptom disorder, mild [F45.8] 03/08/2015  . Coronary artery disease involving native coronary artery of native heart without angina pectoris [I25.10]   . Nausea & vomiting [R11.2] 03/06/2015  . Abdominal pain [R10.9] 03/06/2015  . DM (diabetes mellitus) ([place]) [E11.9] 03/06/2015  . HTN (hypertension) [I10] 03/06/2015  . Gastroparesis [K31.84] 02/24/2015  . Pleural effusion [J90] 02/19/2015  . HCAP (healthcare-associated pneumonia) [J18.9] 02/19/2015  . End-stage renal disease on hemodialysis ([place]) [N18.6, Z99.2] 02/19/2015    Total Time spent with patient: 1 hour  Subjective:   [name] is a 55 y.o. female patient admitted with "I've just been so nervous".  HPI:  Patient interviewed. Chart reviewed. Patient well known from multiple previous encounters including one just earlier this month. Patient had gone home after her last hospital stay but reports that soon thereafter she started to feel sick again. She is not a very clear historian about the course of her illness but it sounds like within the last few days at least her mood is been much worse. She is feeling nervous and down. Has trouble sleeping. It sometimes hard to disentangle her complaints about her mood from those about her physical symptoms from those  about her home situation. She does say that she's been compliant with  all of her medicine. She tells me today that she has started back to have visual hallucinations seeing faces in the wall and hearing music playing.  Social history: Patient lives with her son and I believe some other extended family. Although she did not like being in a rehabilitation or assisted living facility and was very anxious to go home, it seems to stress her out a great deal being home. She complains a great deal about her lack of privacy and perceived inconsiderateness of family members.  Medical history: Multiple medical problems. End-stage renal failure with dialysis.  Substance abuse history: No alcohol or drug abuse  Past Psychiatric History: Patient has a long history of depression and anxiety. Mood decompensates when she sick. She seems to be getting a little bit worse recently. Becomes very distraught at times and when she does will become disorganized in her thoughts. During her last hospitalization she was initially much more confused than she even is now but rapidly improved and was reporting feeling very good by the time she was discharged. I had switched her from Celexa to Prozac because of her long QRS and put her on a very low dose of Zyprexa which she seems to have tolerated well. No history of suicide attempts  Risk to Self: Is patient at risk for suicide?: No Risk to Others:   Prior Inpatient Therapy:   Prior Outpatient Therapy:    Past Medical History:  Past Medical History:  Diagnosis Date  . Anemia   . Anxiety   . Arthritis   . Asthma   . chronic diastolic CHF 4/33/2951  . Collagen vascular disease ([place])   . COPD (chronic obstructive pulmonary disease) ([place])   . Coronary artery disease    a. cath 2013: stenting to RCA (report not available); b. cath 2014: LM nl, pLAD 40%, mLAD nl, ost LCx 40%, mid LCx nl, pRCA 30% @ site of prior stent, mRCA 50%  . Depression   . Diabetes mellitus without complication ([place])   . Diabetic neuropathy ([place])   . dialysis 2006   . ESRD (end stage renal disease) on dialysis ([place])    M-W-F  . GERD (gastroesophageal reflux disease)   . Headache   . Hx of pancreatitis 2015  . Hypertension   . Mitral regurgitation    a. echo 10/2013: EF 62%, noWMA, mildly dilated LA, mild to mod MR/TR, GR1DD  . Myocardial infarction ([place])   . Pneumonia   . Renal insufficiency     Past Surgical History:  Procedure Laterality Date  . ABDOMINAL HYSTERECTOMY    . APPENDECTOMY    . CARDIAC CATHETERIZATION Left 07/26/2015   Procedure: Left Heart Cath and Coronary Angiography;  Surgeon: Dionisio David, MD;  Location: [place] [facility];  Service: Cardiovascular;  Laterality: Left;  . CHOLECYSTECTOMY    . DIALYSIS FISTULA CREATION Left    upper arm  . ESOPHAGOGASTRODUODENOSCOPY N/A 03/08/2015   Procedure: ESOPHAGOGASTRODUODENOSCOPY (EGD);  Surgeon: Manya Silvas, MD;  Location: [facility] ENDOSCOPY;  Service: Endoscopy;  Laterality: N/A;  . ESOPHAGOGASTRODUODENOSCOPY (EGD) WITH PROPOFOL N/A 03/18/2016   Procedure: ESOPHAGOGASTRODUODENOSCOPY (EGD) WITH PROPOFOL;  Surgeon: [name], MD;  Location: [facility] ENDOSCOPY;  Service: Endoscopy;  Laterality: N/A;  . EYE SURGERY    . FECAL TRANSPLANT N/A 08/23/2015   Procedure: FECAL TRANSPLANT;  Surgeon: Manya Silvas, MD;  Location: [facility] ENDOSCOPY;  Service: Endoscopy;  Laterality: N/A;  .  PERIPHERAL VASCULAR CATHETERIZATION N/A 12/20/2015   Procedure: Thrombectomy of dialysis access versus permcath placement;  Surgeon: Algernon Huxley, MD;  Location: [place] [facility];  Service: Cardiovascular;  Laterality: N/A;  . PERIPHERAL VASCULAR CATHETERIZATION N/A 12/20/2015   Procedure: A/V Shunt Intervention;  Surgeon: Algernon Huxley, MD;  Location: [place] [facility];  Service: Cardiovascular;  Laterality: N/A;  . PERIPHERAL VASCULAR CATHETERIZATION N/A 12/20/2015   Procedure: A/V Shuntogram/Fistulagram;  Surgeon: Algernon Huxley, MD;  Location: [place] [facility];  Service: Cardiovascular;  Laterality: N/A;   . PERIPHERAL VASCULAR CATHETERIZATION N/A 01/02/2016   Procedure: A/V Shuntogram/Fistulagram;  Surgeon: Algernon Huxley, MD;  Location: [place] [facility];  Service: Cardiovascular;  Laterality: N/A;  . PERIPHERAL VASCULAR CATHETERIZATION N/A 01/02/2016   Procedure: A/V Shunt Intervention;  Surgeon: Algernon Huxley, MD;  Location: [place] [facility];  Service: Cardiovascular;  Laterality: N/A;   Family History:  Family History  Problem Relation Age of Onset  . Kidney disease Mother   . Diabetes Mother    Family Psychiatric  History: Possible history of anxiety and mood symptoms but it's a little unclear. Social History:  History  Alcohol Use No     History  Drug Use No    Social History   Social History  . Marital status: Divorced    Spouse name: N/A  . Number of children: N/A  . Years of education: N/A   Social History Main Topics  . Smoking status: Former Smoker    Packs/day: 0.50    Types: Cigarettes    Quit date: 02/13/2015  . Smokeless tobacco: Never Used  . Alcohol use No  . Drug use: No  . Sexual activity: Not Asked   Other Topics Concern  . None   Social History Narrative  . None   Additional Social History:    Allergies:   Allergies  Allergen Reactions  . Compazine [Prochlorperazine Edisylate] Anaphylaxis and Nausea And Vomiting    23 Jul - patient relates that she takes promethazine frequently with no problems.  . Ace Inhibitors Swelling  . Ativan [Lorazepam] Other (See Comments)    Reaction:  Hallucinations and headaches  . Codeine Nausea And Vomiting  . Dilaudid [Hydromorphone Hcl] Other (See Comments)    Delirium  . Gabapentin Other (See Comments)    Reaction:  Unknown   . Losartan Other (See Comments)    Reaction:  Unknown   . Ondansetron Other (See Comments)    Reaction:  Unknown   . Prochlorperazine Other (See Comments)    Reaction:  Unknown   . Scopolamine Other (See Comments)    Reaction:  Unknown   . Zofran [Ondansetron Hcl] Other (See  Comments)    Reaction:  hallucinations   . Oxycodone Anxiety  . Tape Rash    Labs:  Results for orders placed or performed during the hospital encounter of 04/21/16 (from the past 48 hour(s))  Lipase, blood     Status: Abnormal   Collection Time: 04/21/16  8:43 AM  Result Value Ref Range   Lipase <10 (L) 11 - 51 U/L  Ethanol     Status: None   Collection Time: 04/21/16  8:43 AM  Result Value Ref Range   Alcohol, Ethyl (B) <5 <5 mg/dL    Comment:        LOWEST DETECTABLE LIMIT FOR SERUM ALCOHOL IS 5 mg/dL FOR MEDICAL PURPOSES ONLY   Protime-INR     Status: None   Collection Time:  04/21/16  8:43 AM  Result Value Ref Range   Prothrombin Time 14.0 11.4 - 15.2 seconds   INR 1.08   APTT     Status: Abnormal   Collection Time: 04/21/16  8:43 AM  Result Value Ref Range   aPTT 46 (H) 24 - 36 seconds    Comment:        IF BASELINE aPTT IS ELEVATED, SUGGEST PATIENT RISK ASSESSMENT BE USED TO DETERMINE APPROPRIATE ANTICOAGULANT THERAPY.   CBC     Status: Abnormal   Collection Time: 04/21/16  8:43 AM  Result Value Ref Range   WBC 7.1 3.6 - 11.0 K/uL   RBC 3.82 3.80 - 5.20 MIL/uL   Hemoglobin 9.3 (L) 12.0 - 16.0 g/dL   HCT 28.3 (L) 35.0 - 47.0 %   MCV 74.2 (L) 80.0 - 100.0 fL   MCH 24.2 (L) 26.0 - 34.0 pg   MCHC 32.6 32.0 - 36.0 g/dL   RDW 22.0 (H) 11.5 - 14.5 %   Platelets 257 150 - 440 K/uL  Differential     Status: None   Collection Time: 04/21/16  8:43 AM  Result Value Ref Range   Neutrophils Relative % 61 %   Lymphocytes Relative 25 %   Monocytes Relative 6 %   Eosinophils Relative 7 %   Basophils Relative 1 %   Neutro Abs 4.3 1.4 - 6.5 K/uL   Lymphs Abs 1.8 1.0 - 3.6 K/uL   Monocytes Absolute 0.4 0.2 - 0.9 K/uL   Eosinophils Absolute 0.5 0 - 0.7 K/uL   Basophils Absolute 0.1 0 - 0.1 K/uL   RBC Morphology POLYCHROMASIA PRESENT     Comment: TARGET CELLS SCHISTOCYTES NOTED ON SMEAR MIXED RBC POPULATION   Comprehensive metabolic panel     Status: Abnormal    Collection Time: 04/21/16  8:43 AM  Result Value Ref Range   Sodium 132 (L) 135 - 145 mmol/L   Potassium 4.4 3.5 - 5.1 mmol/L   Chloride 92 (L) 101 - 111 mmol/L   CO2 28 22 - 32 mmol/L   Glucose, Bld 85 65 - 99 mg/dL   BUN 21 (H) 6 - 20 mg/dL   Creatinine, Ser 4.97 (H) 0.44 - 1.00 mg/dL   Calcium 9.0 8.9 - 10.3 mg/dL   Total Protein 8.4 (H) 6.5 - 8.1 g/dL   Albumin 3.4 (L) 3.5 - 5.0 g/dL   AST 22 15 - 41 U/L   ALT 11 (L) 14 - 54 U/L   Alkaline Phosphatase 102 38 - 126 U/L   Total Bilirubin 0.6 0.3 - 1.2 mg/dL   GFR calc non Af Amer 9 (L) >60 mL/min   GFR calc Af Amer 10 (L) >60 mL/min    Comment: (NOTE) The eGFR has been calculated using the CKD EPI equation. This calculation has not been validated in all clinical situations. eGFR's persistently <60 mL/min signify possible Chronic Kidney Disease.    Anion gap 12 5 - 15  Ammonia     Status: None   Collection Time: 04/21/16  8:43 AM  Result Value Ref Range   Ammonia 27 9 - 35 umol/L  Glucose, capillary     Status: None   Collection Time: 04/21/16  8:43 AM  Result Value Ref Range   Glucose-Capillary 74 65 - 99 mg/dL   Comment 1 Notify RN   Urinalysis complete, with microscopic ([facility] only)     Status: Abnormal   Collection Time: 04/21/16  9:19 AM  Result Value Ref  Range   Color, Urine YELLOW (A) YELLOW   APPearance CLOUDY (A) CLEAR   Glucose, UA NEGATIVE NEGATIVE mg/dL   Bilirubin Urine NEGATIVE NEGATIVE   Ketones, ur NEGATIVE NEGATIVE mg/dL   Specific Gravity, Urine 1.010 1.005 - 1.030   Hgb urine dipstick 2+ (A) NEGATIVE   pH 8.0 5.0 - 8.0   Protein, ur 100 (A) NEGATIVE mg/dL   Nitrite NEGATIVE NEGATIVE   Leukocytes, UA 3+ (A) NEGATIVE   RBC / HPF TOO NUMEROUS TO COUNT 0 - 5 RBC/hpf   WBC, UA TOO NUMEROUS TO COUNT 0 - 5 WBC/hpf   Bacteria, UA RARE (A) NONE SEEN   Squamous Epithelial / LPF 0-5 (A) NONE SEEN   WBC Clumps PRESENT    Mucous PRESENT   Urine Drug Screen, Qualitative ([facility] only)     Status: Abnormal    Collection Time: 04/21/16  9:19 AM  Result Value Ref Range   Tricyclic, Ur Screen NONE DETECTED NONE DETECTED   Amphetamines, Ur Screen NONE DETECTED NONE DETECTED   MDMA (Ecstasy)Ur Screen NONE DETECTED NONE DETECTED   Cocaine Metabolite,Ur [place] NONE DETECTED NONE DETECTED   Opiate, Ur Screen POSITIVE (A) NONE DETECTED   Phencyclidine (PCP) Ur S NONE DETECTED NONE DETECTED   Cannabinoid 50 Ng, Ur [place] NONE DETECTED NONE DETECTED   Barbiturates, Ur Screen NONE DETECTED NONE DETECTED   Benzodiazepine, Ur Scrn NONE DETECTED NONE DETECTED   Methadone Scn, Ur NONE DETECTED NONE DETECTED    Comment: (NOTE) 735  Tricyclics, urine               Cutoff 1000 ng/mL 200  Amphetamines, urine             Cutoff 1000 ng/mL 300  MDMA (Ecstasy), urine           Cutoff 500 ng/mL 400  Cocaine Metabolite, urine       Cutoff 300 ng/mL 500  Opiate, urine                   Cutoff 300 ng/mL 600  Phencyclidine (PCP), urine      Cutoff 25 ng/mL 700  Cannabinoid, urine              Cutoff 50 ng/mL 800  Barbiturates, urine             Cutoff 200 ng/mL 900  Benzodiazepine, urine           Cutoff 200 ng/mL 1000 Methadone, urine                Cutoff 300 ng/mL 1100 1200 The urine drug screen provides only a preliminary, unconfirmed 1300 analytical test result and should not be used for non-medical 1400 purposes. Clinical consideration and professional judgment should 1500 be applied to any positive drug screen result due to possible 1600 interfering substances. A more specific alternate chemical method 1700 must be used in order to obtain a confirmed analytical result.  1800 Gas chromato graphy / mass spectrometry (GC/MS) is the preferred 1900 confirmatory method.   Glucose, capillary     Status: None   Collection Time: 04/21/16  5:29 PM  Result Value Ref Range   Glucose-Capillary 65 65 - 99 mg/dL   Comment 1 Notify RN    Comment 2 Document in Chart   CBC     Status: Abnormal   Collection Time: 04/21/16  5:31  PM  Result Value Ref Range   WBC 6.9 3.6 - 11.0 K/uL  RBC 3.81 3.80 - 5.20 MIL/uL   Hemoglobin 9.3 (L) 12.0 - 16.0 g/dL   HCT 28.0 (L) 35.0 - 47.0 %   MCV 73.5 (L) 80.0 - 100.0 fL   MCH 24.4 (L) 26.0 - 34.0 pg   MCHC 33.2 32.0 - 36.0 g/dL   RDW 22.9 (H) 11.5 - 14.5 %   Platelets 254 150 - 440 K/uL  Creatinine, serum     Status: Abnormal   Collection Time: 04/21/16  5:31 PM  Result Value Ref Range   Creatinine, Ser 5.31 (H) 0.44 - 1.00 mg/dL   GFR calc non Af Amer 8 (L) >60 mL/min   GFR calc Af Amer 10 (L) >60 mL/min    Comment: (NOTE) The eGFR has been calculated using the CKD EPI equation. This calculation has not been validated in all clinical situations. eGFR's persistently <60 mL/min signify possible Chronic Kidney Disease.   MRSA PCR Screening     Status: None   Collection Time: 04/21/16  6:07 PM  Result Value Ref Range   MRSA by PCR NEGATIVE NEGATIVE    Comment:        The GeneXpert MRSA Assay (FDA approved for NASAL specimens only), is one component of a comprehensive MRSA colonization surveillance program. It is not intended to diagnose MRSA infection nor to guide or monitor treatment for MRSA infections.     Current Facility-Administered Medications  Medication Dose Route Frequency Provider Last Rate Last Dose  . acidophilus (RISAQUAD) capsule 1 capsule  1 capsule Oral Daily Nicholes Mango, MD   1 capsule at 04/21/16 1736  . albuterol (PROVENTIL) (2.5 MG/3ML) 0.083% nebulizer solution 2.5 mg  2.5 mg Inhalation Q4H PRN Nicholes Mango, MD      . amLODipine (NORVASC) tablet 10 mg  10 mg Oral Daily Nicholes Mango, MD   10 mg at 04/21/16 1736  . [START ON 04/22/2016] aspirin EC tablet 81 mg  81 mg Oral BH-q7a Aruna Gouru, MD      . atorvastatin (LIPITOR) tablet 20 mg  20 mg Oral QPM Nicholes Mango, MD   20 mg at 04/21/16 1736  . cefTRIAXone (ROCEPHIN) 1 g in dextrose 5 % 50 mL IVPB  1 g Intravenous Once Delman Kitten, MD   Stopped at 04/21/16 1515  . [START ON 04/22/2016]  cefTRIAXone (ROCEPHIN) 1 g in dextrose 5 % 50 mL IVPB  1 g Intravenous Q24H Aruna Gouru, MD      . clopidogrel (PLAVIX) tablet 75 mg  75 mg Oral Daily Nicholes Mango, MD   75 mg at 04/21/16 1736  . famotidine (PEPCID) tablet 10 mg  10 mg Oral BID Nicholes Mango, MD      . FLUoxetine (PROZAC) capsule 20 mg  20 mg Oral Daily Aruna Gouru, MD   20 mg at 04/21/16 1736  . heparin injection 5,000 Units  5,000 Units Subcutaneous Q12H Aruna Gouru, MD      . hydrALAZINE (APRESOLINE) tablet 25 mg  25 mg Oral TID Nicholes Mango, MD   25 mg at 04/21/16 1736  . HYDROcodone-acetaminophen (NORCO/VICODIN) 5-325 MG per tablet 1 tablet  1 tablet Oral Q6H PRN Nicholes Mango, MD   1 tablet at 04/21/16 1753  . [START ON 04/22/2016] lipase/protease/amylase (CREON) capsule 24,000 Units  24,000 Units Oral Q lunch Nicholes Mango, MD      . lipase/protease/amylase (CREON) capsule 36,000 Units  36,000 Units Oral BID Nicholes Mango, MD   36,000 Units at 04/21/16 1737  . loratadine (CLARITIN) tablet 10 mg  10 mg Oral Daily Nicholes Mango, MD   10 mg at 04/21/16 1736  . OLANZapine (ZYPREXA) tablet 5 mg  5 mg Oral QHS Fleet Higham T Chrys Landgrebe, MD      . pantoprazole (PROTONIX) EC tablet 40 mg  40 mg Oral BID AC Nicholes Mango, MD   40 mg at 04/21/16 1736  . pregabalin (LYRICA) capsule 25 mg  25 mg Oral Daily Nicholes Mango, MD   25 mg at 04/21/16 1753  . ranolazine (RANEXA) 12 hr tablet 1,000 mg  1,000 mg Oral BID Nicholes Mango, MD      . rifaximin (XIFAXAN) tablet 550 mg  550 mg Oral BID Nicholes Mango, MD      . sevelamer carbonate (RENVELA) tablet 800 mg  800 mg Oral TID WC Nicholes Mango, MD   800 mg at 04/21/16 1737    Musculoskeletal: Strength & Muscle Tone: decreased Gait & Station: ataxic Patient leans: N/A  Psychiatric Specialty Exam: Physical Exam  Constitutional: She appears well-developed. She appears distressed.  HENT:  Head: Normocephalic and atraumatic.  Eyes: Conjunctivae are normal. Pupils are equal, round, and reactive to light.  Neck: Normal  range of motion.  Cardiovascular: Normal heart sounds.   Respiratory: Effort normal. No respiratory distress.  GI: Soft. There is tenderness. There is guarding.  Musculoskeletal: Normal range of motion.  Neurological: She is alert.  Skin: Skin is warm and dry.  Psychiatric: Her behavior is normal. Her mood appears anxious. Her speech is tangential. Cognition and memory are normal. She expresses impulsivity. She exhibits a depressed mood. She expresses no suicidal ideation.    Review of Systems  Constitutional: Positive for diaphoresis and malaise/fatigue.  HENT: Negative.   Eyes: Negative.   Respiratory: Negative.   Cardiovascular: Negative.   Gastrointestinal: Positive for abdominal pain and nausea.  Musculoskeletal: Negative.   Skin: Negative.   Neurological: Positive for weakness (recurrent abdominal pain and infections possible stroke).  Psychiatric/Behavioral: Positive for depression and hallucinations. Negative for memory loss, substance abuse and suicidal ideas. The patient is nervous/anxious and has insomnia.     Blood pressure (!) 155/59, pulse 65, temperature 97.7 F (36.5 C), temperature source Oral, resp. rate 20, height 5' 5.5" (1.664 m), weight 69.9 kg (154 lb), SpO2 99 %.Body mass index is 25.24 kg/m.  General Appearance: Fairly Groomed  Eye Contact:  Good  Speech:  Clear and Coherent  Volume:  Normal  Mood:  Anxious, Depressed and Dysphoric  Affect:  Depressed  Thought Process:  Goal Directed  Orientation:  Full (Time, Place, and Person)  Thought Content:  Hallucinations: Auditory Visual and Tangential  Suicidal Thoughts:  No  Homicidal Thoughts:  No  Memory:  Immediate;   Good Recent;   Fair Remote;   Fair  Judgement:  Fair  Insight:  Shallow  Psychomotor Activity:  Decreased  Concentration:  Concentration: Poor  Recall:  Good  Fund of Knowledge:  Fair  Language:  Fair  Akathisia:  No  Handed:  Right  AIMS (if indicated):     Assets:  Communication  Skills Desire for Improvement Housing Resilience Social Support  ADL's:  Impaired  Cognition:  WNL  Sleep:        Treatment Plan Summary: Daily contact with patient to assess and evaluate symptoms and progress in treatment, Medication management and Plan 55 year old woman with recurrent depression symptoms and hallucinations. A lot of it is anxiety driven and a lot of that is very situationally based. Currently there is no concern about suicidality. Although  she reports hallucinations and she is not behaving in a bizarre manner. I spent some time talking with her and offering supportive therapy. I also tried to encourage her to at least start thinking more about her living situation. Meanwhile I am going to increase her Zyprexa to 5 mg at night while leaving the Prozac where it is. I will follow-up as needed in the hospital.  Disposition: Patient does not meet criteria for psychiatric inpatient admission. Supportive therapy provided about ongoing stressors.  Alethia Berthold, MD 04/21/2016 7:43 PM

## 2016-04-21 NOTE — ED Notes (Signed)
PICC team contacted about access

## 2016-04-21 NOTE — Progress Notes (Signed)
Subjective:   Admitted with Nausea, vomiting and abdominal pain. Refused to go to outpatient dialysis today and came to the ED. Last dialysis was Friday.    Objective:  Vital signs in last 24 hours:  Temp:  [97.7 F (36.5 C)-98 F (36.7 C)] 98 F (36.7 C) (07/31 1550) Pulse Rate:  [62-68] 68 (07/31 1550) Resp:  [15-30] 20 (07/31 1550) BP: (137-160)/(50-76) 139/56 (07/31 1550) SpO2:  [92 %-100 %] 92 % (07/31 1550) Weight:  [68.5 kg (151 lb)-70.2 kg (154 lb 12.8 oz)] 69.9 kg (154 lb) (07/31 1603)  Weight change:  Filed Weights   04/21/16 0710 04/21/16 1550 04/21/16 1603  Weight: 68.5 kg (151 lb) 70.2 kg (154 lb 12.8 oz) 69.9 kg (154 lb)    Intake/Output:   No intake or output data in the 24 hours ending 04/21/16 1738   Physical Exam: General: Laying in bed  HEENT Anicteric. Slight hearing impairment.   Neck Supple  Pulm/lungs Clear to auscultation bilaterally.  CVS/Heart Soft systolic murmur. Regular rhythm.   Abdomen:  +guarded  Extremities: No peripheral edema.  Neurologic: Alert and able to answer questions appropriately.  Skin: No rashes.  Access: Left AV fistula.        Basic Metabolic Panel:   Recent Labs Lab 04/21/16 0843  NA 132*  K 4.4  CL 92*  CO2 28  GLUCOSE 85  BUN 21*  CREATININE 4.97*  CALCIUM 9.0     CBC:  Recent Labs Lab 04/21/16 0843  WBC 7.1  NEUTROABS 4.3  HGB 9.3*  HCT 28.3*  MCV 74.2*  PLT 257      Microbiology:  Recent Results (from the past 720 hour(s))  Blood culture (routine x 2)     Status: None   Collection Time: 04/08/16  8:06 AM  Result Value Ref Range Status   Specimen Description BLOOD  Final   Special Requests BOTTLES DRAWN AEROBIC AND ANAEROBIC [place]  Final   Culture NO GROWTH 5 DAYS  Final   Report Status 04/13/2016 FINAL  Final  Urine culture     Status: None   Collection Time: 04/10/16 11:14 AM  Result Value Ref Range Status   Specimen Description URINE, RANDOM  Final   Special  Requests NONE  Final   Culture NO GROWTH Performed at [facility]   Final   Report Status 04/12/2016 FINAL  Final    Coagulation Studies:  Recent Labs  04/21/16 0843  LABPROT 14.0  INR 1.08    Urinalysis:  Recent Labs  04/21/16 0919  COLORURINE YELLOW*  LABSPEC 1.010  PHURINE 8.0  GLUCOSEU NEGATIVE  HGBUR 2+*  BILIRUBINUR NEGATIVE  KETONESUR NEGATIVE  PROTEINUR 100*  NITRITE NEGATIVE  LEUKOCYTESUR 3+*      Imaging: Ct Abdomen Pelvis Wo Contrast  Result Date: 04/21/2016 CLINICAL DATA:  Right-sided abdominal pain. EXAM: CT ABDOMEN AND PELVIS WITHOUT CONTRAST TECHNIQUE: Multidetector CT imaging of the abdomen and pelvis was performed following the standard protocol without IV contrast. COMPARISON:  CT scan of March 15, 2016. FINDINGS: There is continued presence of loculated pleural effusions in both visualized lung bases. No significant osseous abnormality is noted. Status post cholecystectomy. No focal abnormality is noted in the liver, spleen or pancreas on these unenhanced images. Adrenal glands are unremarkable. Bilateral renal atrophy is noted. Extensive atherosclerotic calcifications are seen involving the abdominal aorta and mesenteric and renal arteries. There is no evidence of bowel obstruction. Diverticulosis of sigmoid colon is noted without inflammation. No abnormal fluid collection  is noted. Urinary bladder appears normal. Bilateral inguinal adenopathy is noted which appears to be slightly enlarged compared to prior exam, with the largest lymph nodes measuring 1.3 cm bilaterally. Moderate anasarca is noted. IMPRESSION: Continued presence of loculated pleural effusion in both lung bases. Bilateral renal atrophy. Aortic atherosclerosis. Diverticulosis of sigmoid colon without inflammation. Moderate anasarca. Bilateral inguinal adenopathy is noted which is slightly enlarged compared to prior exam. This most likely is inflammatory or reactive in etiology, but  neoplasm cannot be excluded. Clinical correlation is recommended. Electronically Signed   By: Marijo Conception, M.D.   On: 04/21/2016 11:51  Ct Head Code Stroke W/o Cm  Result Date: 04/21/2016 CLINICAL DATA:  Right-sided weakness. EXAM: CT HEAD WITHOUT CONTRAST TECHNIQUE: Contiguous axial images were obtained from the base of the skull through the vertex without intravenous contrast. COMPARISON:  04/08/2016 FINDINGS: Normal appearance of the brain. No evidence of acute infarct, hemorrhage, hydrocephalus, or mass lesion. No hyperdense vessel. Calcified atherosclerosis in the carotid and vertebral circulation. Negative skull and visualized orbits. Clear visualized paranasal sinuses. ASPECTS [facility] Stroke Program Early CT Score, http://[email or web address].[email or web address]) - Ganglionic level infarction (caudate, lentiform nuclei, internal capsule, insula, M1-M3 cortex): 7 - Supraganglionic infarction (M4-M6 cortex): 3 Total score (0-10): 10 These results were called by telephone at the time of interpretation on 04/21/2016 at 8:47 am to Dr. Delman Kitten , who verbally acknowledged these results. IMPRESSION: 1. Negative for hemorrhage or visible infarct. 2. ASPECTS score 10. Electronically Signed   By: Monte Fantasia M.D.   On: 04/21/2016 08:48    Medications:     .  stroke: mapping our early stages of recovery book   Does not apply Once  . acidophilus  1 capsule Oral Daily  . amLODipine  10 mg Oral Daily  . [START ON 04/22/2016] aspirin EC  81 mg Oral BH-q7a  . atorvastatin  20 mg Oral QPM  . cefTRIAXone (ROCEPHIN)  IV  1 g Intravenous Once  . [START ON 04/22/2016] cefTRIAXone (ROCEPHIN)  IV  1 g Intravenous Q24H  . clopidogrel  75 mg Oral Daily  . famotidine  10 mg Oral BID  . FLUoxetine  20 mg Oral Daily  . heparin subcutaneous  5,000 Units Subcutaneous Q12H  . hydrALAZINE  25 mg Oral TID  . [START ON 04/22/2016] lipase/protease/amylase  24,000 Units Oral Q lunch  . lipase/protease/amylase  36,000 Units Oral BID   . loratadine  10 mg Oral Daily  . OLANZapine  2.5 mg Oral QHS  . pantoprazole  40 mg Oral BID AC  . pregabalin  25 mg Oral Daily  . ranolazine  1,000 mg Oral BID  . rifaximin  550 mg Oral BID  . sevelamer carbonate  800 mg Oral TID WC   albuterol, HYDROcodone-acetaminophen  Assessment/ Plan:  55 y.o. female with insulin dependent diabetes, hypertension, hyperlipidemia, diabetic retinopathy, diabetic neuropathy, diabetic gastroparesis, pancreatic insufficiency, depression, GERD. Renal masses - avoiding epo   MWF [facility] [address]   1. End Stage Renal Disease: no acute indication for dialysis. Tentatively plan for tomorrow. Then resume MWF schedule.   2. Anemia of chronic kidney disease: hemoglobin 9.3 - Not getting erythropoeitin stimulating agent as outpatient due to renal masses. However CT from 6/24 does not seem to show any renal masses.   3. Secondary Hyperparathyroidism:  - Continue sevelamer  4. Hypertension: elevated due to pain and nausea.  - amlodipine and hydralazine.     LOS: 0 [name] 7/31/20175:38 PM

## 2016-04-21 NOTE — ED Notes (Signed)
Pt placed on 2 L [place] for comfort r/t anxiety attack.

## 2016-04-21 NOTE — ED Notes (Signed)
Dr Jacqualine Code aware loss IV access

## 2016-04-21 NOTE — Progress Notes (Signed)
Anticoagulation monitoring(Lovenox):  55 yo  Female  ordered Lovenox 30 mg Q24h  Filed Weights   04/21/16 0710 04/21/16 1550 04/21/16 1603  Weight: 151 lb (68.5 kg) 154 lb 12.8 oz (70.2 kg) 154 lb (69.9 kg)   BMI    Lab Results  Component Value Date   CREATININE 4.97 (H) 04/21/2016   CREATININE 3.88 (H) 04/11/2016   CREATININE 4.78 (H) 04/09/2016   Estimated Creatinine Clearance: 12.6 mL/min (by C-G formula based on SCr of 4.97 mg/dL). Hemoglobin & Hematocrit     Component Value Date/Time   HGB 9.3 (L) 04/21/2016 0843   HGB 10.5 (L) 09/15/2014 0948   HCT 28.3 (L) 04/21/2016 0843   HCT 34.2 (L) 09/15/2014 [id number]     Per Protocol for Patient with estCrcl < 15 ml/min and BMI < 40, will transition to Heparin 5000 units SQ Q12H .

## 2016-04-21 NOTE — ED Notes (Signed)
Received call from PICC team. They will be here within 2.5 hr. Dr guru aware.

## 2016-04-21 NOTE — ED Notes (Signed)
Transported to CT 

## 2016-04-21 NOTE — ED Notes (Signed)
Pt asked if son coming. Reported spoke to him but not sure if he is coming. Offered to call pt said no that's ok. Offered again and pt states " do not get smart I said ok". Just informed pt waiting for CT to come take for abdominal pictures.

## 2016-04-21 NOTE — ED Notes (Signed)
Pt screams in room randomly and crying.  Given tissues.

## 2016-04-21 NOTE — ED Notes (Signed)
Patient transported to CT 

## 2016-04-21 NOTE — ED Notes (Signed)
Finishing last bottle contrast.

## 2016-04-21 NOTE — ED Provider Notes (Signed)
[facility] Emergency Department Provider Note   ____________________________________________   First MD Initiated Contact with Patient 04/21/16 [phone]     (approximate)  I have reviewed the triage vital signs and the nursing notes.   HISTORY  Chief Complaint Abdominal Pain and Emesis    HPI [name] is a 55 y.o. female presents for evaluation of intermittent nausea and vomiting. Patient tells me that while she was in the waiting room, she began to experience tingling in her right arm and right leg. She also experiences weakness in the right arm and right leg. However, on further discussion she goes on to describe that she's noticed weakness on this side since yesterday and at she's having a hard time recalling exactly when things including her symptoms of tingling and numbness started. She does report abdominal pain ongoing since yesterday as well. No fevers or chills. No chest pain. No shortness of breath.  She missed her dialysis session today, she was on her way there when she decided to have them bring her to the emergency room per her report.  After further discussing with the patient, it appears she is very difficult historian having difficulty really recalling a concise history for many events.   Past Medical History:  Diagnosis Date  . Anemia   . Anxiety   . Arthritis   . Asthma   . chronic diastolic CHF [id number]  . Collagen vascular disease ([place])   . COPD (chronic obstructive pulmonary disease) ([place])   . Coronary artery disease    a. cath 2013: stenting to RCA (report not available); b. cath 2014: LM nl, pLAD 40%, mLAD nl, ost LCx 40%, mid LCx nl, pRCA 30% @ site of prior stent, mRCA 50%  . Depression   . Diabetes mellitus without complication ([place])   . Diabetic neuropathy ([place])   . dialysis 2006  . ESRD (end stage renal disease) on dialysis ([place])    M-W-F  . GERD (gastroesophageal reflux disease)   . Headache   . Hx of pancreatitis  2015  . Hypertension   . Mitral regurgitation    a. echo 10/2013: EF 62%, noWMA, mildly dilated LA, mild to mod MR/TR, GR1DD  . Myocardial infarction ([place])   . Pneumonia   . Renal insufficiency     Patient Active Problem List   Diagnosis Date Noted  . TIA (transient ischemic attack) 04/21/2016  . Altered mental status 04/08/2016  . Hyperammonemia ([place]) 04/08/2016  . Elevated troponin 04/08/2016  . ESRD (end stage renal disease) ([place]) 04/08/2016  . Depression 04/08/2016  . Depression, major, recurrent, severe with psychosis ([place]) 04/08/2016  . Blood in stool   . Intractable cyclical vomiting with nausea   . Reflux esophagitis   . Gastritis   . Generalized abdominal pain   . Uncontrollable vomiting   . Major depressive disorder, recurrent episode, moderate ([place]) 03/15/2016  . Adjustment disorder with mixed anxiety and depressed mood 03/15/2016  . Malnutrition of moderate degree 12/01/2015  . Renal mass   . Dyspnea   . Acute renal failure ([place])   . Respiratory failure ([place])   . High temperature 11/14/2015  . Pulmonary edema   . Encounter for central line placement   . Encounter for orogastric (OG) tube placement   . Nausea 11/12/2015  . Hyperkalemia 10/03/2015  . C. difficile diarrhea 07/22/2015  . Pneumonia 05/21/2015  . Hypoglycemia 04/24/2015  . Unresponsiveness 04/24/2015  . Bradycardia 04/24/2015  . Hypothermia 04/24/2015  . Acute  respiratory failure ([place]) 04/24/2015  . Acute diastolic CHF (congestive heart failure) ([place]) 04/05/2015  . Diabetic gastroparesis ([place]) 04/05/2015  . Hypokalemia 04/05/2015  . Generalized weakness 04/05/2015  . Acute pulmonary edema ([place]) 04/03/2015  . Nausea and vomiting 04/03/2015  . Hypoglycemia associated with diabetes ([place]) 04/03/2015  . Anemia of chronic disease 04/03/2015  . Secondary hyperparathyroidism ([place]) 04/03/2015  . Pressure ulcer 04/02/2015  . Acute respiratory failure with hypoxia ([place]) 04/01/2015  . Adjustment  disorder with anxiety 03/14/2015  . Somatic symptom disorder, mild 03/08/2015  . Coronary artery disease involving native coronary artery of native heart without angina pectoris   . Nausea & vomiting 03/06/2015  . Abdominal pain 03/06/2015  . DM (diabetes mellitus) ([place]) 03/06/2015  . HTN (hypertension) 03/06/2015  . Gastroparesis 02/24/2015  . Pleural effusion 02/19/2015  . HCAP (healthcare-associated pneumonia) 02/19/2015  . End-stage renal disease on hemodialysis ([place]) 02/19/2015    Past Surgical History:  Procedure Laterality Date  . ABDOMINAL HYSTERECTOMY    . APPENDECTOMY    . CARDIAC CATHETERIZATION Left 07/26/2015   Procedure: Left Heart Cath and Coronary Angiography;  Surgeon: Dionisio David, MD;  Location: [place] CV LAB;  Service: Cardiovascular;  Laterality: Left;  . CHOLECYSTECTOMY    . DIALYSIS FISTULA CREATION Left    upper arm  . ESOPHAGOGASTRODUODENOSCOPY N/A 03/08/2015   Procedure: ESOPHAGOGASTRODUODENOSCOPY (EGD);  Surgeon: Manya Silvas, MD;  Location: [facility] ENDOSCOPY;  Service: Endoscopy;  Laterality: N/A;  . ESOPHAGOGASTRODUODENOSCOPY (EGD) WITH PROPOFOL N/A 03/18/2016   Procedure: ESOPHAGOGASTRODUODENOSCOPY (EGD) WITH PROPOFOL;  Surgeon: [name], MD;  Location: [facility] ENDOSCOPY;  Service: Endoscopy;  Laterality: N/A;  . EYE SURGERY    . FECAL TRANSPLANT N/A 08/23/2015   Procedure: FECAL TRANSPLANT;  Surgeon: Manya Silvas, MD;  Location: [facility] ENDOSCOPY;  Service: Endoscopy;  Laterality: N/A;  . PERIPHERAL VASCULAR CATHETERIZATION N/A 12/20/2015   Procedure: Thrombectomy of dialysis access versus permcath placement;  Surgeon: Algernon Huxley, MD;  Location: [place] CV LAB;  Service: Cardiovascular;  Laterality: N/A;  . PERIPHERAL VASCULAR CATHETERIZATION N/A 12/20/2015   Procedure: A/V Shunt Intervention;  Surgeon: Algernon Huxley, MD;  Location: [place] CV LAB;  Service: Cardiovascular;  Laterality: N/A;  . PERIPHERAL VASCULAR CATHETERIZATION N/A  12/20/2015   Procedure: A/V Shuntogram/Fistulagram;  Surgeon: Algernon Huxley, MD;  Location: [place] CV LAB;  Service: Cardiovascular;  Laterality: N/A;  . PERIPHERAL VASCULAR CATHETERIZATION N/A 01/02/2016   Procedure: A/V Shuntogram/Fistulagram;  Surgeon: Algernon Huxley, MD;  Location: [place] [facility] LAB;  Service: Cardiovascular;  Laterality: N/A;  . PERIPHERAL VASCULAR CATHETERIZATION N/A 01/02/2016   Procedure: A/V Shunt Intervention;  Surgeon: Algernon Huxley, MD;  Location: [place] [facility] LAB;  Service: Cardiovascular;  Laterality: N/A;    Prior to Admission medications   Medication Sig Start Date End Date Taking? Authorizing Provider  acidophilus (RISAQUAD) CAPS capsule Take 1 capsule by mouth daily.   Yes Historical Provider, MD  albuterol (PROVENTIL HFA;VENTOLIN HFA) 108 (90 Base) MCG/ACT inhaler Inhale 2 puffs into the lungs every 4 (four) hours as needed for wheezing or shortness of breath.   Yes Historical Provider, MD  albuterol (PROVENTIL) (2.5 MG/3ML) 0.083% nebulizer solution Take 2.5 mg by nebulization every 4 (four) hours as needed for wheezing or shortness of breath.   Yes Historical Provider, MD  amLODipine (NORVASC) 10 MG tablet Take 10 mg by mouth daily.   Yes Historical Provider, MD  aspirin EC 81 MG tablet Take 81 mg by  mouth every morning.    Yes Historical Provider, MD  atorvastatin (LIPITOR) 20 MG tablet Take 20 mg by mouth every evening.   Yes Historical Provider, MD  cetirizine (ZYRTEC) 10 MG tablet Take 10 mg by mouth daily.    Yes Historical Provider, MD  clopidogrel (PLAVIX) 75 MG tablet Take 75 mg by mouth daily. Reported on 12/17/2015   Yes Historical Provider, MD  FLUoxetine (PROZAC) 20 MG capsule Take 1 capsule (20 mg total) by mouth daily. 04/10/16  Yes Lytle Butte, MD  glucose 4 GM chewable tablet Chew 3-4 tablets by mouth as needed for low blood sugar.   Yes Historical Provider, MD  hydrALAZINE (APRESOLINE) 25 MG tablet Take 25 mg by mouth 3 (three) times  daily.   Yes Historical Provider, MD  HYDROcodone-acetaminophen (NORCO/VICODIN) 5-325 MG tablet Take 1 tablet by mouth every 6 (six) hours as needed for moderate pain or severe pain. Patient taking differently: Take 1 tablet by mouth 2 (two) times daily as needed for moderate pain or severe pain.  03/19/16  Yes Gladstone Lighter, MD  lipase/protease/amylase (CREON) 12000 UNITS CPEP capsule Take 12,000-36,000 Units by mouth See admin instructions. 36,000 units daily with breakfast; 24,000 units daily with lunch, and 36,000 units daily with dinner   Yes Historical Provider, MD  multivitamin (RENA-VIT) TABS tablet Take 1 tablet by mouth daily.   Yes Historical Provider, MD  OLANZapine (ZYPREXA) 2.5 MG tablet Take 1 tablet (2.5 mg total) by mouth at bedtime. 04/10/16  Yes Lytle Butte, MD  omega-3 acid ethyl esters (LOVAZA) 1 g capsule Take 1 g by mouth daily.   Yes Historical Provider, MD  pantoprazole (PROTONIX) 40 MG tablet Take 1 tablet (40 mg total) by mouth 2 (two) times daily before a meal. 03/19/16  Yes Gladstone Lighter, MD  promethazine (PHENERGAN) 12.5 MG tablet Take 12.5 mg by mouth every 8 (eight) hours as needed for nausea.   Yes Historical Provider, MD  ranitidine (ZANTAC) 150 MG tablet Take 150 mg by mouth daily.    Yes Historical Provider, MD  ranolazine (RANEXA) 1000 MG SR tablet Take 1,000 mg by mouth 2 (two) times daily.   Yes Historical Provider, MD  rifaximin (XIFAXAN) 550 MG TABS tablet Take 1 tablet (550 mg total) by mouth 2 (two) times daily. 04/10/16  Yes Lytle Butte, MD  sevelamer carbonate (RENVELA) 800 MG tablet Take 800 mg by mouth 3 (three) times daily with meals.    Yes Historical Provider, MD  vitamin B-12 (CYANOCOBALAMIN) 1000 MCG tablet Take 1,000 mcg by mouth daily.   Yes Historical Provider, MD  pregabalin (LYRICA) 25 MG capsule Take 25 mg by mouth daily.    Historical Provider, MD    Allergies Compazine [prochlorperazine edisylate]; Ace inhibitors; Ativan  [lorazepam]; Codeine; Dilaudid [hydromorphone hcl]; Gabapentin; Losartan; Ondansetron; Prochlorperazine; Scopolamine; Zofran [ondansetron hcl]; Oxycodone; and Tape  Family History  Problem Relation Age of Onset  . Kidney disease Mother   . Diabetes Mother     Social History Social History  Substance Use Topics  . Smoking status: Former Smoker    Packs/day: 0.50    Types: Cigarettes    Quit date: 02/13/2015  . Smokeless tobacco: Never Used  . Alcohol use No    Review of Systems Constitutional: No fever/chills Eyes: No visual changes. ENT: No sore throat. Cardiovascular: Denies chest pain. Respiratory: Denies shortness of breath. Gastrointestinal: Upper abdominal pain for 2 days   No diarrhea.  No constipation. Genitourinary: Negative for dysuria.  Musculoskeletal: Negative for back pain. Skin: Negative for rash. Neurological: Negative for headaches. See history of present illness. 10-point ROS otherwise negative.  ____________________________________________   PHYSICAL EXAM:  VITAL SIGNS: ED Triage Vitals  Enc Vitals Group     BP 04/21/16 0710 (!) 137/50     Pulse Rate 04/21/16 0710 63     Resp 04/21/16 0710 18     Temp 04/21/16 0710 97.7 F (36.5 C)     Temp Source 04/21/16 0710 Oral     SpO2 04/21/16 0710 100 %     Weight 04/21/16 0710 151 lb (68.5 kg)     Height --      Head Circumference --      Peak Flow --      Pain Score 04/21/16 0712 8     Pain Loc --      Pain Edu? --      Excl. in [place]? --     Constitutional: Alert and oriented. Chronically ill-appearing but in no distress. Eyes: Conjunctivae are normal. PERRL. EOMI. Head: Atraumatic. Nose: No congestion/rhinnorhea. Mouth/Throat: Mucous membranes are moist.  Oropharynx non-erythematous. Neck: No stridor.   Cardiovascular: Normal rate, regular rhythm. Grossly normal heart sounds.   Respiratory: Normal respiratory effort.  No retractions. Lungs CTAB. Gastrointestinal: Soft and nontender except for  reported moderate tenderness to palpation in the right upper quadrant epigastrium. No distention. No abdominal bruits. No CVA tenderness. Musculoskeletal: No lower extremity tenderness nor edema.   Neurologic:  Normal speech and language.   NIH score equals 2, performed by me at bedside. The patient has no pronator drift. The patient has normal cranial nerve exam. Extraocular movements are normal. Visual fields are normal. Patient has 5 out of 5 strength in all extremities, though mild pronator drift is noted in the right lower extremity. There is no numbness or gross, acute sensory abnormality in the extremities bilaterally except for reported decreased sensation over the right hand and right foot. No speech disturbance. No dysarthria. No aphasia. No ataxia. Normal finger nose finger bilat. Patient speaking in full and clear sentences.   Skin:  Skin is warm, dry and intact. No rash noted. Psychiatric: Mood and affect are normal. Speech and behavior are normal.  ____________________________________________   LABS (all labs ordered are listed, but only abnormal results are displayed)  Labs Reviewed  LIPASE, BLOOD - Abnormal; Notable for the following:       Result Value   Lipase <10 (*)    All other components within normal limits  APTT - Abnormal; Notable for the following:    aPTT 46 (*)    All other components within normal limits  CBC - Abnormal; Notable for the following:    Hemoglobin 9.3 (*)    HCT 28.3 (*)    MCV 74.2 (*)    MCH 24.2 (*)    RDW 22.0 (*)    All other components within normal limits  COMPREHENSIVE METABOLIC PANEL - Abnormal; Notable for the following:    Sodium 132 (*)    Chloride 92 (*)    BUN 21 (*)    Creatinine, Ser 4.97 (*)    Total Protein 8.4 (*)    Albumin 3.4 (*)    ALT 11 (*)    GFR calc non Af Amer 9 (*)    GFR calc Af Amer 10 (*)    All other components within normal limits  URINALYSIS COMPLETEWITH MICROSCOPIC ([facility] ONLY) -  Abnormal; Notable for the following:  Color, Urine YELLOW (*)    APPearance CLOUDY (*)    Hgb urine dipstick 2+ (*)    Protein, ur 100 (*)    Leukocytes, UA 3+ (*)    Bacteria, UA RARE (*)    Squamous Epithelial / LPF 0-5 (*)    All other components within normal limits  URINE DRUG SCREEN, QUALITATIVE ([facility] ONLY) - Abnormal; Notable for the following:    Opiate, Ur Screen POSITIVE (*)    All other components within normal limits  URINE CULTURE  MRSA PCR SCREENING  ETHANOL  PROTIME-INR  DIFFERENTIAL  AMMONIA  GLUCOSE, CAPILLARY  CBC  CREATININE, SERUM   ____________________________________________  EKG Reviewed and interpreted by me at 7:16 AM Normal sinus rhythm, low voltage. No evidence of acute ischemic change. Agree with computer read of intervals Vent. rate 62 BPM PR interval 126 ms QRS duration 92 ms QT/QTc 422/428 ms P-R-T axes 27 31 61  ____________________________________________  RADIOLOGY  Ct Abdomen Pelvis Wo Contrast  Result Date: 04/21/2016 CLINICAL DATA:  Right-sided abdominal pain. EXAM: CT ABDOMEN AND PELVIS WITHOUT CONTRAST TECHNIQUE: Multidetector CT imaging of the abdomen and pelvis was performed following the standard protocol without IV contrast. COMPARISON:  CT scan of March 15, 2016. FINDINGS: There is continued presence of loculated pleural effusions in both visualized lung bases. No significant osseous abnormality is noted. Status post cholecystectomy. No focal abnormality is noted in the liver, spleen or pancreas on these unenhanced images. Adrenal glands are unremarkable. Bilateral renal atrophy is noted. Extensive atherosclerotic calcifications are seen involving the abdominal aorta and mesenteric and renal arteries. There is no evidence of bowel obstruction. Diverticulosis of sigmoid colon is noted without inflammation. No abnormal fluid collection is noted. Urinary bladder appears normal. Bilateral inguinal adenopathy is noted which appears to be  slightly enlarged compared to prior exam, with the largest lymph nodes measuring 1.3 cm bilaterally. Moderate anasarca is noted. IMPRESSION: Continued presence of loculated pleural effusion in both lung bases. Bilateral renal atrophy. Aortic atherosclerosis. Diverticulosis of sigmoid colon without inflammation. Moderate anasarca. Bilateral inguinal adenopathy is noted which is slightly enlarged compared to prior exam. This most likely is inflammatory or reactive in etiology, but neoplasm cannot be excluded. Clinical correlation is recommended. Electronically Signed   By: Marijo Conception, M.D.   On: 04/21/2016 11:51  Ct Head Code Stroke W/o Cm  Result Date: 04/21/2016 CLINICAL DATA:  Right-sided weakness. EXAM: CT HEAD WITHOUT CONTRAST TECHNIQUE: Contiguous axial images were obtained from the base of the skull through the vertex without intravenous contrast. COMPARISON:  04/08/2016 FINDINGS: Normal appearance of the brain. No evidence of acute infarct, hemorrhage, hydrocephalus, or mass lesion. No hyperdense vessel. Calcified atherosclerosis in the carotid and vertebral circulation. Negative skull and visualized orbits. Clear visualized paranasal sinuses. ASPECTS [facility] Stroke Program Early CT Score, http://[email or web address].[email or web address]) - Ganglionic level infarction (caudate, lentiform nuclei, internal capsule, insula, M1-M3 cortex): 7 - Supraganglionic infarction (M4-M6 cortex): 3 Total score (0-10): 10 These results were called by telephone at the time of interpretation on 04/21/2016 at 8:47 am to Dr. Delman Kitten , who verbally acknowledged these results. IMPRESSION: 1. Negative for hemorrhage or visible infarct. 2. ASPECTS score 10. Electronically Signed   By: Monte Fantasia M.D.   On: 04/21/2016 08:48   ____________________________________________   PROCEDURES  Procedure(s) performed: None  Procedures  Critical Care performed: Yes, see critical care note(s)  CRITICAL CARE Performed by: Delman Kitten   Total critical care time: 40 minutes  Critical care time  was exclusive of separately billable procedures and treating other patients.  Critical care was necessary to treat or prevent imminent or life-threatening deterioration.  Critical care was time spent personally by me on the following activities: development of treatment plan with patient and/or surrogate as well as nursing, discussions with consultants, evaluation of patient's response to treatment, examination of patient, obtaining history from patient or surrogate, ordering and performing treatments and interventions, ordering and review of laboratory studies, ordering and review of radiographic studies, pulse oximetry and re-evaluation of patient's condition.  Given patient's initial report of acute onset of tingling and weakness in the right arm and right leg the patient was evaluated in the "code stroke" pathway. However on further discussion of her symptoms, she is not a TPA candidate given that she is now later reporting her symptoms may have started last night. Even then, is somewhat hard to understand when her symptoms have truly started as she seems to have very poor recall. ____________________________________________   INITIAL IMPRESSION / [place] / ED COURSE  Pertinent labs & imaging results that were available during my care of the patient were reviewed by me and considered in my medical decision making (see chart for details).  Patient presents with difficulty and potential weakness or the right arm and right leg for which the time of onset slightly unclear but appears to be last night. In addition the patient notes abdominal pain which was the chief complaint head ER arrival, however she noticed this numbness seemed to be more concerning thereafter.  Seen by neurology specialist on-call, and I agree the patient is not a good TPA candidate. Further workup per the inpatient team  Regarding the patient's  abdominal pain, no obvious etiology is noted. Hospitalist and I did discuss her persistent CT findings of loculated pleural effusions, and also appears she has a UTI which may be contributing to some of her confusion and poor recollection.  In addition I'm concerned about the possibility of depression or psychiatric disease and the possible etiology. Patient appears stable. Discussed with nephrologist on-call, Dr. Abigail Butts. We'll admit the patient for further evaluation of her right-sided paresthesias as well as UTI and altered mental status.  Clinical Course     ____________________________________________   FINAL CLINICAL IMPRESSION(S) / ED DIAGNOSES  Final diagnoses:  Nausea vomiting and diarrhea  Paresthesia  Acute urinary tract infection      NEW MEDICATIONS STARTED DURING THIS VISIT:  Current Discharge Medication List       Note:  This document was prepared using Dragon voice recognition software and may include unintentional dictation errors.     Delman Kitten, MD 04/21/16 [phone]

## 2016-04-21 NOTE — ED Notes (Signed)
[facility] neurologist on screen for pt assessment.

## 2016-04-21 NOTE — Care Management (Signed)
Patient is currently followed by [facility] home health nursing.  Agency made aware of presentation and plans for either inpatient or observation stay.  Informed dialysis coordinator of presentation.  Receives HD MWF.  Did not go for treatment today due to illness.

## 2016-04-21 NOTE — H&P (Signed)
[facility] NAME: [name]    MR#:  [id number]  DATE OF BIRTH:  Mar 31, 1961  DATE OF ADMISSION:  04/21/2016  PRIMARY CARE PHYSICIAN: Ellamae Sia, MD   REQUESTING/REFERRING PHYSICIAN: QUALE  CHIEF COMPLAINT:  Right-sided weakness  HISTORY OF PRESENT ILLNESS:  [name]  is a 55 y.o. female with a known history of End-stage renal disease on hemodialysis, chronic encephalopathy, chronic diastolic congestive heart failure, anxiety, COPD, diabetes mellitus and multiple other medical problems was seen by dialysis center today and was sent to the hospital as patient was nauseous and vomiting also reporting right-sided weakness. During my examination patient was nauseous and reporting body aches t. Urinalysis is abnormal. Patient is a poor historian in view of encephalopathy  PAST MEDICAL HISTORY:   Past Medical History:  Diagnosis Date  . Anemia   . Anxiety   . Arthritis   . Asthma   . chronic diastolic CHF [id number]  . Collagen vascular disease ([place])   . COPD (chronic obstructive pulmonary disease) ([place])   . Coronary artery disease    a. cath 2013: stenting to RCA (report not available); b. cath 2014: LM nl, pLAD 40%, mLAD nl, ost LCx 40%, mid LCx nl, pRCA 30% @ site of prior stent, mRCA 50%  . Depression   . Diabetes mellitus without complication ([place])   . Diabetic neuropathy ([place])   . dialysis 2006  . ESRD (end stage renal disease) on dialysis ([place])    M-W-F  . GERD (gastroesophageal reflux disease)   . Headache   . Hx of pancreatitis 2015  . Hypertension   . Mitral regurgitation    a. echo 10/2013: EF 62%, noWMA, mildly dilated LA, mild to mod MR/TR, GR1DD  . Myocardial infarction ([place])   . Pneumonia   . Renal insufficiency     PAST SURGICAL HISTOIRY:   Past Surgical History:  Procedure Laterality Date  . ABDOMINAL HYSTERECTOMY    . APPENDECTOMY    . CARDIAC CATHETERIZATION Left 07/26/2015   Procedure:  Left Heart Cath and Coronary Angiography;  Surgeon: Dionisio David, MD;  Location: [place] [facility];  Service: Cardiovascular;  Laterality: Left;  . CHOLECYSTECTOMY    . DIALYSIS FISTULA CREATION Left    upper arm  . ESOPHAGOGASTRODUODENOSCOPY N/A 03/08/2015   Procedure: ESOPHAGOGASTRODUODENOSCOPY (EGD);  Surgeon: Manya Silvas, MD;  Location: [facility] ENDOSCOPY;  Service: Endoscopy;  Laterality: N/A;  . ESOPHAGOGASTRODUODENOSCOPY (EGD) WITH PROPOFOL N/A 03/18/2016   Procedure: ESOPHAGOGASTRODUODENOSCOPY (EGD) WITH PROPOFOL;  Surgeon: [name], MD;  Location: [facility] ENDOSCOPY;  Service: Endoscopy;  Laterality: N/A;  . EYE SURGERY    . FECAL TRANSPLANT N/A 08/23/2015   Procedure: FECAL TRANSPLANT;  Surgeon: Manya Silvas, MD;  Location[facility] ENDOSCOPY;  Service: Endoscopy;  Laterality: N/A;  . PERIPHERAL VASCULAR CATHETERIZATION N/A 12/20/2015   Procedure: Thrombectomy of dialysis access versus permcath placement;  Surgeon: Algernon Huxley, MD;  Location: [place] [facility];  Service: Cardiovascular;  Laterality: N/A;  . PERIPHERAL VASCULAR CATHETERIZATION N/A 12/20/2015   Procedure: A/V Shunt Intervention;  Surgeon: Algernon Huxley, MD;  Location: [place] [facility];  Service: Cardiovascular;  Laterality: N/A;  . PERIPHERAL VASCULAR CATHETERIZATION N/A 12/20/2015   Procedure: A/V Shuntogram/Fistulagram;  Surgeon: Algernon Huxley, MD;  Location: [place] [facility];  Service: Cardiovascular;  Laterality: N/A;  . PERIPHERAL VASCULAR CATHETERIZATION N/A 01/02/2016   Procedure: A/V Shuntogram/Fistulagram;  Surgeon: Algernon Huxley, MD;  Location: [place] [facility];  Service: Cardiovascular;  Laterality: N/A;  . PERIPHERAL VASCULAR CATHETERIZATION N/A 01/02/2016   Procedure: A/V Shunt Intervention;  Surgeon: Algernon Huxley, MD;  Location: [place] [facility];  Service: Cardiovascular;  Laterality: N/A;    SOCIAL HISTORY:   Social History  Substance Use Topics  . Smoking status: Former Smoker     Packs/day: 0.50    Types: Cigarettes    Quit date: 02/13/2015  . Smokeless tobacco: Never Used  . Alcohol use No    FAMILY HISTORY:   Family History  Problem Relation Age of Onset  . Kidney disease Mother   . Diabetes Mother     DRUG ALLERGIES:   Allergies  Allergen Reactions  . Compazine [Prochlorperazine Edisylate] Anaphylaxis and Nausea And Vomiting    23 Jul - patient relates that she takes promethazine frequently with no problems.  . Ace Inhibitors Swelling  . Ativan [Lorazepam] Other (See Comments)    Reaction:  Hallucinations and headaches  . Codeine Nausea And Vomiting  . Dilaudid [Hydromorphone Hcl] Other (See Comments)    Delirium  . Gabapentin Other (See Comments)    Reaction:  Unknown   . Losartan Other (See Comments)    Reaction:  Unknown   . Ondansetron Other (See Comments)    Reaction:  Unknown   . Prochlorperazine Other (See Comments)    Reaction:  Unknown   . Scopolamine Other (See Comments)    Reaction:  Unknown   . Zofran [Ondansetron Hcl] Other (See Comments)    Reaction:  hallucinations   . Oxycodone Anxiety  . Tape Rash    REVIEW OF SYSTEMS:  CONSTITUTIONAL: No fever, fatigue or weakness.  EYES: No blurred or double vision.  EARS, NOSE, AND THROAT: No tinnitus or ear pain.  RESPIRATORY: No cough, shortness of breath, wheezing or hemoptysis.  CARDIOVASCULAR: No chest pain, orthopnea, edema.  GASTROINTESTINAL: Reporting nausea, vomiting, denies diarrhea or reports abdominal pain.  GENITOURINARY: No dysuria, hematuria.  ENDOCRINE: No polyuria, nocturia,  HEMATOLOGY: No anemia, easy bruising or bleeding SKIN: No rash or lesion. MUSCULOSKELETAL: No joint pain or arthritis.   NEUROLOGIC: No tingling, numbness. Reporting right-sided weakness PSYCHIATRY: Anxious , denies depression.   MEDICATIONS AT HOME:   Prior to Admission medications   Medication Sig Start Date End Date Taking? Authorizing Provider  acidophilus (RISAQUAD) CAPS capsule  Take 1 capsule by mouth daily.   Yes Historical Provider, MD  albuterol (PROVENTIL HFA;VENTOLIN HFA) 108 (90 Base) MCG/ACT inhaler Inhale 2 puffs into the lungs every 4 (four) hours as needed for wheezing or shortness of breath.   Yes Historical Provider, MD  albuterol (PROVENTIL) (2.5 MG/3ML) 0.083% nebulizer solution Take 2.5 mg by nebulization every 4 (four) hours as needed for wheezing or shortness of breath.   Yes Historical Provider, MD  amLODipine (NORVASC) 10 MG tablet Take 10 mg by mouth daily.   Yes Historical Provider, MD  aspirin EC 81 MG tablet Take 81 mg by mouth every morning.    Yes Historical Provider, MD  atorvastatin (LIPITOR) 20 MG tablet Take 20 mg by mouth every evening.   Yes Historical Provider, MD  cetirizine (ZYRTEC) 10 MG tablet Take 10 mg by mouth daily.    Yes Historical Provider, MD  clopidogrel (PLAVIX) 75 MG tablet Take 75 mg by mouth daily. Reported on 12/17/2015   Yes Historical Provider, MD  FLUoxetine (PROZAC) 20 MG capsule Take 1 capsule (20 mg total) by mouth daily. 04/10/16  Yes Shanon Brow  K Hower, MD  glucose 4 GM chewable tablet Chew 3-4 tablets by mouth as needed for low blood sugar.   Yes Historical Provider, MD  hydrALAZINE (APRESOLINE) 25 MG tablet Take 25 mg by mouth 3 (three) times daily.   Yes Historical Provider, MD  HYDROcodone-acetaminophen (NORCO/VICODIN) 5-325 MG tablet Take 1 tablet by mouth every 6 (six) hours as needed for moderate pain or severe pain. Patient taking differently: Take 1 tablet by mouth 2 (two) times daily as needed for moderate pain or severe pain.  03/19/16  Yes Gladstone Lighter, MD  lipase/protease/amylase (CREON) 12000 UNITS CPEP capsule Take 12,000-36,000 Units by mouth See admin instructions. 36,000 units daily with breakfast; 24,000 units daily with lunch, and 36,000 units daily with dinner   Yes Historical Provider, MD  multivitamin (RENA-VIT) TABS tablet Take 1 tablet by mouth daily.   Yes Historical Provider, MD  OLANZapine  (ZYPREXA) 2.5 MG tablet Take 1 tablet (2.5 mg total) by mouth at bedtime. 04/10/16  Yes Lytle Butte, MD  omega-3 acid ethyl esters (LOVAZA) 1 g capsule Take 1 g by mouth daily.   Yes Historical Provider, MD  pantoprazole (PROTONIX) 40 MG tablet Take 1 tablet (40 mg total) by mouth 2 (two) times daily before a meal. 03/19/16  Yes Gladstone Lighter, MD  promethazine (PHENERGAN) 12.5 MG tablet Take 12.5 mg by mouth every 8 (eight) hours as needed for nausea.   Yes Historical Provider, MD  ranitidine (ZANTAC) 150 MG tablet Take 150 mg by mouth daily.    Yes Historical Provider, MD  ranolazine (RANEXA) 1000 MG SR tablet Take 1,000 mg by mouth 2 (two) times daily.   Yes Historical Provider, MD  rifaximin (XIFAXAN) 550 MG TABS tablet Take 1 tablet (550 mg total) by mouth 2 (two) times daily. 04/10/16  Yes Lytle Butte, MD  sevelamer carbonate (RENVELA) 800 MG tablet Take 800 mg by mouth 3 (three) times daily with meals.    Yes Historical Provider, MD  vitamin B-12 (CYANOCOBALAMIN) 1000 MCG tablet Take 1,000 mcg by mouth daily.   Yes Historical Provider, MD  pregabalin (LYRICA) 25 MG capsule Take 25 mg by mouth daily.    Historical Provider, MD      VITAL SIGNS:  Blood pressure (!) 139/56, pulse 68, temperature 98 F (36.7 C), temperature source Oral, resp. rate 20, height 5' 5.5" (1.664 m), weight 69.9 kg (154 lb), SpO2 92 %.  PHYSICAL EXAMINATION:  GENERAL:  55 y.o.-year-old patient lying in the bed with no acute distress.  EYES: Pupils equal, round, reactive to light and accommodation. No scleral icterus. Extraocular muscles intact.  HEENT: Head atraumatic, normocephalic. Oropharynx and nasopharynx clear.  NECK:  Supple, no jugular venous distention. No thyroid enlargement, no tenderness.  LUNGS: Normal breath sounds bilaterally, no wheezing, rales,rhonchi or crepitation. No use of accessory muscles of respiration.  CARDIOVASCULAR: S1, S2 normal. No murmurs, rubs, or gallops.  ABDOMEN: Soft,  nontender, nondistended. Bowel sounds present. No organomegaly or mass.  EXTREMITIES: No pedal edema, cyanosis, or clubbing.  NEUROLOGIC: Cranial nerves II through XII are intact. Muscle strength 5/5 in Left sided extremities. Right upper extremity and lower extremity motor is 2-3 out of 5 Sensation intact. Gait not checked.  PSYCHIATRIC: The patient is alert and oriented x 2.  SKIN: No obvious rash, lesion, or ulcer.   LABORATORY PANEL:   CBC  Recent Labs Lab 04/21/16 0843  WBC 7.1  HGB 9.3*  HCT 28.3*  PLT 257   ------------------------------------------------------------------------------------------------------------------  Chemistries  Recent Labs Lab 04/21/16 0843  NA 132*  K 4.4  CL 92*  CO2 28  GLUCOSE 85  BUN 21*  CREATININE 4.97*  CALCIUM 9.0  AST 22  ALT 11*  ALKPHOS 102  BILITOT 0.6   ------------------------------------------------------------------------------------------------------------------  Cardiac Enzymes No results for input(s): TROPONINI in the last 168 hours. ------------------------------------------------------------------------------------------------------------------  RADIOLOGY:  Ct Abdomen Pelvis Wo Contrast  Result Date: 04/21/2016 CLINICAL DATA:  Right-sided abdominal pain. EXAM: CT ABDOMEN AND PELVIS WITHOUT CONTRAST TECHNIQUE: Multidetector CT imaging of the abdomen and pelvis was performed following the standard protocol without IV contrast. COMPARISON:  CT scan of March 15, 2016. FINDINGS: There is continued presence of loculated pleural effusions in both visualized lung bases. No significant osseous abnormality is noted. Status post cholecystectomy. No focal abnormality is noted in the liver, spleen or pancreas on these unenhanced images. Adrenal glands are unremarkable. Bilateral renal atrophy is noted. Extensive atherosclerotic calcifications are seen involving the abdominal aorta and mesenteric and renal arteries. There is no  evidence of bowel obstruction. Diverticulosis of sigmoid colon is noted without inflammation. No abnormal fluid collection is noted. Urinary bladder appears normal. Bilateral inguinal adenopathy is noted which appears to be slightly enlarged compared to prior exam, with the largest lymph nodes measuring 1.3 cm bilaterally. Moderate anasarca is noted. IMPRESSION: Continued presence of loculated pleural effusion in both lung bases. Bilateral renal atrophy. Aortic atherosclerosis. Diverticulosis of sigmoid colon without inflammation. Moderate anasarca. Bilateral inguinal adenopathy is noted which is slightly enlarged compared to prior exam. This most likely is inflammatory or reactive in etiology, but neoplasm cannot be excluded. Clinical correlation is recommended. Electronically Signed   By: Marijo Conception, M.D.   On: 04/21/2016 11:51  Ct Head Code Stroke W/o Cm  Result Date: 04/21/2016 CLINICAL DATA:  Right-sided weakness. EXAM: CT HEAD WITHOUT CONTRAST TECHNIQUE: Contiguous axial images were obtained from the base of the skull through the vertex without intravenous contrast. COMPARISON:  04/08/2016 FINDINGS: Normal appearance of the brain. No evidence of acute infarct, hemorrhage, hydrocephalus, or mass lesion. No hyperdense vessel. Calcified atherosclerosis in the carotid and vertebral circulation. Negative skull and visualized orbits. Clear visualized paranasal sinuses. ASPECTS [facility] Stroke Program Early CT Score, http://www.aspectsinstroke.com) - Ganglionic level infarction (caudate, lentiform nuclei, internal capsule, insula, M1-M3 cortex): 7 - Supraganglionic infarction (M4-M6 cortex): 3 Total score (0-10): 10 These results were called by telephone at the time of interpretation on 04/21/2016 at 8:47 am to Dr. Delman Kitten , who verbally acknowledged these results. IMPRESSION: 1. Negative for hemorrhage or visible infarct. 2. ASPECTS score 10. Electronically Signed   By: Monte Fantasia M.D.   On:  04/21/2016 08:48   EKG:   Orders placed or performed during the hospital encounter of 04/21/16  . ED EKG  . ED EKG  . EKG 12-Lead  . EKG 12-Lead    IMPRESSION AND PLAN:  [name]  is a 55 y.o. female with a known history of End-stage renal disease on hemodialysis, chronic encephalopathy, chronic diastolic congestive heart failure, anxiety, COPD, diabetes mellitus and multiple other medical problems was seen by dialysis center today and was sent to the hospital as patient was nauseous and vomiting also reporting right-sided weakness. During my examination patient was nauseous and reporting body aches t. Urinalysis is abnormal. Patient is a poor historian in view of encephalopathy  #TIA/CVA CT head is negative Stroke workup with MRI brain, carotid Dopplers and 2-D echocardiogram Check fasting lipid panel. Start patient on statin Neurology consult is  placed Patient has passed bedside swallow evaluation PT and OT consult Provide aspirin  #Acute cystitis with abnormal urinalysis Follow-up on urine culture and sensitivity. Patient will be continued on IV Rocephin while waiting for culture results  #Intractable nausea and vomiting with generalized abdominal pain Provide antiemetics Patient is with end-stage renal disease. If necessary we will provide fluid resuscitation Pain management as needed  #End stage renal disease Continue hemodialysis per nephrology. Consult placed to nephrology and discussed with Dr.:Kolluru  #Hallucination with history of depression Psychiatry consult is placed      All the records are reviewed and case discussed with ED provider. Management plans discussed with the patient, family and they are in agreement.  CODE STATUS: FC/SON   TOTAL TIME TAKING CARE OF THIS PATIENT: 45 minutes.   Note: This dictation was prepared with Dragon dictation along with smaller phrase technology. Any transcriptional errors that result from this process are  unintentional.  Nicholes Mango M.D on 04/21/2016 at 5:10 PM  Between 7am to 6pm - Pager - [phone]  After 6pm go to [email or web address] - password [email or web address] [place] [facility]  Office  [phone]  CC: Primary care physician; Ellamae Sia, MD

## 2016-04-21 NOTE — ED Notes (Addendum)
Pt states "i am seeing the people again, I see you even when you are not here". Pt now asking for son to come to hospital. Called son and he willc ome. Dr Jacqualine Code notified.

## 2016-04-21 NOTE — ED Triage Notes (Signed)
Pt presents with RUQ abdominal pain with nausea and vomiting since yesterday. Pt was supposed to have dialysis today but could not go due to the pain and vomiting. Pt last dialysis was 04/18/16.

## 2016-04-22 ENCOUNTER — Inpatient Hospital Stay: Payer: Medicare Other

## 2016-04-22 LAB — LIPID PANEL
Cholesterol: 106 mg/dL (ref 0–200)
HDL: 72 mg/dL (ref >40)
LDL Cholesterol: 22 mg/dL (ref 0–99)
Total CHOL/HDL Ratio: 1.5 RATIO
Triglycerides: 59 mg/dL (ref <150)
VLDL: 12 mg/dL (ref 0–40)

## 2016-04-22 LAB — GLUCOSE, CAPILLARY
Glucose-Capillary: 64 mg/dL — ABNORMAL LOW (ref 65–99)
Glucose-Capillary: 160 mg/dL — ABNORMAL HIGH (ref 65–99)
Glucose-Capillary: 50 mg/dL — ABNORMAL LOW (ref 65–99)
Glucose-Capillary: 52 mg/dL — ABNORMAL LOW (ref 65–99)
Glucose-Capillary: 162 mg/dL — ABNORMAL HIGH (ref 65–99)
Glucose-Capillary: 57 mg/dL — ABNORMAL LOW (ref 65–99)
Glucose-Capillary: 107 mg/dL — ABNORMAL HIGH (ref 65–99)
Glucose-Capillary: 94 mg/dL (ref 65–99)
Glucose-Capillary: 69 mg/dL (ref 65–99)
Glucose-Capillary: 72 mg/dL (ref 65–99)
Glucose-Capillary: 68 mg/dL (ref 65–99)
Glucose-Capillary: 65 mg/dL (ref 65–99)
Glucose-Capillary: 70 mg/dL (ref 65–99)

## 2016-04-22 LAB — ECHOCARDIOGRAM COMPLETE
E decel time: 208 msec
E/e' ratio: 10.97
FS: 39 % (ref 28–44)
Height: 65.5 in
IVS/LV PW RATIO, ED: 1.08
LA ID, A-P, ES: 47 mm
LA diam end sys: 47 mm
LA diam index: 2.6 cm/m2
LA vol A4C: 64.5 ml
LA vol index: 44.4 mL/m2
LA vol: 80.2 mL
LV E/e' medial: 10.97
LV E/e'average: 10.97
LV PW d: 10.1 mm — AB (ref 0.6–1.1)
LV e' LATERAL: 11.3 cm/s
Lateral S' vel: 12.4 cm/s
MV Dec: 208
MV Peak grad: 6 mmHg
MV pk A vel: 110 m/s
MV pk E vel: 124 m/s
TAPSE: 19.4 mm
TDI e' lateral: 11.3
TDI e' medial: 8.7
VTI: 233 cm
Weight: 2464 oz

## 2016-04-22 MED ORDER — PROMETHAZINE HCL 25 MG/ML IJ SOLN
12.5000 mg | Freq: Four times a day (QID) | INTRAMUSCULAR | Status: DC | PRN
  Administered 2016-04-22 – 2016-04-23 (×4): 12.5 mg via INTRAVENOUS
  Filled 2016-04-22 (×5): qty 1

## 2016-04-22 MED ORDER — DEXTROSE 50 % IV SOLN
INTRAVENOUS | Status: AC
  Administered 2016-04-22: 06:00:00 50 mL
  Filled 2016-04-22: qty 50

## 2016-04-22 MED ORDER — MORPHINE SULFATE (PF) 2 MG/ML IV SOLN
2.0000 mg | INTRAVENOUS | Status: DC | PRN
  Administered 2016-04-22 – 2016-04-25 (×3): 2 mg via INTRAVENOUS
  Filled 2016-04-22 (×3): qty 1

## 2016-04-22 MED ORDER — BUTALBITAL-APAP-CAFFEINE 50-325-40 MG PO TABS
1.0000 | ORAL_TABLET | Freq: Four times a day (QID) | ORAL | Status: DC | PRN
  Administered 2016-04-22: 14:00:00 1 via ORAL
  Administered 2016-04-23: 2 via ORAL
  Filled 2016-04-22 (×3): qty 2

## 2016-04-22 NOTE — Progress Notes (Signed)
[name] MRN: [id number] DOB: 05-04-1961   Cancelled Treatment:    Reason Eval/Treat Not Completed: Patient at procedure or test/unavailable (Consult received and chart reviewed. Patient currently off unit for diagnostic testing.  Will re-attempt at later time/date as patient available and medically appropriate.)   Calix Heinbaugh H. Owens Shark, PT, DPT, NCS 04/22/16, 9:44 AM [phone]

## 2016-04-22 NOTE — Care Management Important Message (Signed)
Important Message  Patient Details  Name: [name] MRN: [id number] Date of Birth: 1960-11-28   Medicare Important Message Given:  Yes    Shelbie Ammons, RN 04/22/2016, 8:06 AM

## 2016-04-22 NOTE — Progress Notes (Signed)
PRE HD ASSESSMENT 

## 2016-04-22 NOTE — Progress Notes (Signed)
OT Cancellation Note  Patient Details Name: [name] MRN: [id number] DOB: [birth year]   Cancelled Treatment:    Reason Eval/Treat Not Completed: Patient at procedure or test/ unavailable Order received and chart reviewed.  Pt is at MRI and Ultrasound.  Will attempt again later today.  Thank you for the referral.  Chrys Racer, OTR/L ascom [phone] 04/22/2016, 9:59 AM

## 2016-04-22 NOTE — Consult Note (Signed)
[facility] Face-to-Face Psychiatry Consult   Reason for Consult:  Consult follow-up for 55 year old woman with a diagnosis of bipolar disorder but with chronic anxiety and depression symptoms and social difficulties along with multiple severe medical problems Referring Physician:  Hower Patient Identification: [name] MRN:  [id number] Principal Diagnosis: Depression, major, recurrent, severe with psychosis ([place]) Diagnosis:   Patient Active Problem List   Diagnosis Date Noted  . TIA (transient ischemic attack) [G45.9] 04/21/2016  . Altered mental status [R41.82] 04/08/2016  . Hyperammonemia ([place]) [E72.20] 04/08/2016  . Elevated troponin [R79.89] 04/08/2016  . ESRD (end stage renal disease) ([place]) [N18.6] 04/08/2016  . Depression [F32.9] 04/08/2016  . Depression, major, recurrent, severe with psychosis ([place]) [F33.3] 04/08/2016  . Blood in stool [K92.1]   . Intractable cyclical vomiting with nausea [G43.A1]   . Reflux esophagitis [K21.0]   . Gastritis [K29.70]   . Generalized abdominal pain [R10.84]   . Uncontrollable vomiting [R11.10]   . Major depressive disorder, recurrent episode, moderate ([place]) [F33.1] 03/15/2016  . Adjustment disorder with mixed anxiety and depressed mood [F43.23] 03/15/2016  . Malnutrition of moderate degree [E44.0] 12/01/2015  . Renal mass [N28.89]   . Dyspnea [R06.00]   . Acute renal failure ([place]) [N17.9]   . Respiratory failure ([place]) [J96.90]   . High temperature [R50.9] 11/14/2015  . Pulmonary edema [J81.1]   . Encounter for central line placement [Z45.2]   . Encounter for orogastric (OG) tube placement [Z46.59]   . Nausea [R11.0] 11/12/2015  . Hyperkalemia [E87.5] 10/03/2015  . C. difficile diarrhea [A04.7] 07/22/2015  . Pneumonia [J18.9] 05/21/2015  . Hypoglycemia [E16.2] 04/24/2015  . Unresponsiveness [R40.4] 04/24/2015  . Bradycardia [R00.1] 04/24/2015  . Hypothermia [T68.XXXA] 04/24/2015  . Acute respiratory failure ([place]) [J96.00] 04/24/2015  .  Acute diastolic CHF (congestive heart failure) ([place]) [I50.31] 04/05/2015  . Diabetic gastroparesis ([place]) [E11.43, K31.84] 04/05/2015  . Hypokalemia [E87.6] 04/05/2015  . Generalized weakness [R53.1] 04/05/2015  . Acute pulmonary edema ([place]) [J81.0] 04/03/2015  . Nausea and vomiting [R11.2] 04/03/2015  . Hypoglycemia associated with diabetes ([place]) [E11.649] 04/03/2015  . Anemia of chronic disease [D63.8] 04/03/2015  . Secondary hyperparathyroidism ([place]) [N25.81] 04/03/2015  . Pressure ulcer [L89.90] 04/02/2015  . Acute respiratory failure with hypoxia ([place]) [J96.01] 04/01/2015  . Adjustment disorder with anxiety [F43.22] 03/14/2015  . Somatic symptom disorder, mild [F45.8] 03/08/2015  . Coronary artery disease involving native coronary artery of native heart without angina pectoris [I25.10]   . Nausea & vomiting [R11.2] 03/06/2015  . Abdominal pain [R10.9] 03/06/2015  . DM (diabetes mellitus) ([place]) [E11.9] 03/06/2015  . HTN (hypertension) [I10] 03/06/2015  . Gastroparesis [K31.84] 02/24/2015  . Pleural effusion [J90] 02/19/2015  . HCAP (healthcare-associated pneumonia) [J18.9] 02/19/2015  . End-stage renal disease on hemodialysis ([place]) [N18.6, Z99.2] 02/19/2015    Total Time spent with patient: 30 minutes  Subjective:   [name] is a 55 y.o. female patient admitted with "I slept better".  HPI:  Follow-up on yesterday's evaluation. Patient says that she slept better last night. Got 8 or 9 hours of sleep. They tried to do dialysis today but she had clots in the line and will have to be reevaluated for dialysis tomorrow. Meanwhile she feels like her mood is a little bit more relaxed. Not as anxious. She had a talk with her son which she seems to characterize his having been positive last night. Still having some visual hallucinations but doesn't seem to be distressed by them. No other new complaints no indication  of acute dangerousness.  Past Psychiatric History: Long-standing mood  problems with a lot of exacerbation when she is medically sick. She had been doing very well when she last left the hospital and then seems to of decompensated pretty soon after going back home. Already seems to be returning to a better baseline no history of actual suicide attempts.  Risk to Self: Is patient at risk for suicide?: No Risk to Others:   Prior Inpatient Therapy:   Prior Outpatient Therapy:    Past Medical History:  Past Medical History:  Diagnosis Date  . Anemia   . Anxiety   . Arthritis   . Asthma   . chronic diastolic CHF 03/07/736  . Collagen vascular disease ([place])   . COPD (chronic obstructive pulmonary disease) ([place])   . Coronary artery disease    a. cath 2013: stenting to RCA (report not available); b. cath 2014: LM nl, pLAD 40%, mLAD nl, ost LCx 40%, mid LCx nl, pRCA 30% @ site of prior stent, mRCA 50%  . Depression   . Diabetes mellitus without complication ([place])   . Diabetic neuropathy ([place])   . dialysis 2006  . ESRD (end stage renal disease) on dialysis ([place])    M-W-F  . GERD (gastroesophageal reflux disease)   . Headache   . Hx of pancreatitis 2015  . Hypertension   . Mitral regurgitation    a. echo 10/2013: EF 62%, noWMA, mildly dilated LA, mild to mod MR/TR, GR1DD  . Myocardial infarction ([place])   . Pneumonia   . Renal insufficiency     Past Surgical History:  Procedure Laterality Date  . ABDOMINAL HYSTERECTOMY    . APPENDECTOMY    . CARDIAC CATHETERIZATION Left 07/26/2015   Procedure: Left Heart Cath and Coronary Angiography;  Surgeon: Dionisio David, MD;  Location: [place] [facility];  Service: Cardiovascular;  Laterality: Left;  . CHOLECYSTECTOMY    . DIALYSIS FISTULA CREATION Left    upper arm  . ESOPHAGOGASTRODUODENOSCOPY N/A 03/08/2015   Procedure: ESOPHAGOGASTRODUODENOSCOPY (EGD);  Surgeon: Manya Silvas, MD;  Location: [facility] ENDOSCOPY;  Service: Endoscopy;  Laterality: N/A;  . ESOPHAGOGASTRODUODENOSCOPY (EGD) WITH PROPOFOL N/A 03/18/2016    Procedure: ESOPHAGOGASTRODUODENOSCOPY (EGD) WITH PROPOFOL;  Surgeon: [name], MD;  Location: [facility] ENDOSCOPY;  Service: Endoscopy;  Laterality: N/A;  . EYE SURGERY    . FECAL TRANSPLANT N/A 08/23/2015   Procedure: FECAL TRANSPLANT;  Surgeon: Manya Silvas, MD;  Location: [facility] ENDOSCOPY;  Service: Endoscopy;  Laterality: N/A;  . PERIPHERAL VASCULAR CATHETERIZATION N/A 12/20/2015   Procedure: Thrombectomy of dialysis access versus permcath placement;  Surgeon: Algernon Huxley, MD;  Location: [place] [facility];  Service: Cardiovascular;  Laterality: N/A;  . PERIPHERAL VASCULAR CATHETERIZATION N/A 12/20/2015   Procedure: A/V Shunt Intervention;  Surgeon: Algernon Huxley, MD;  Location: [place] [facility];  Service: Cardiovascular;  Laterality: N/A;  . PERIPHERAL VASCULAR CATHETERIZATION N/A 12/20/2015   Procedure: A/V Shuntogram/Fistulagram;  Surgeon: Algernon Huxley, MD;  Location: [place] [facility];  Service: Cardiovascular;  Laterality: N/A;  . PERIPHERAL VASCULAR CATHETERIZATION N/A 01/02/2016   Procedure: A/V Shuntogram/Fistulagram;  Surgeon: Algernon Huxley, MD;  Location: [place] [facility];  Service: Cardiovascular;  Laterality: N/A;  . PERIPHERAL VASCULAR CATHETERIZATION N/A 01/02/2016   Procedure: A/V Shunt Intervention;  Surgeon: Algernon Huxley, MD;  Location: [place] [facility];  Service: Cardiovascular;  Laterality: N/A;   Family History:  Family History  Problem Relation Age of Onset  .  Kidney disease Mother   . Diabetes Mother    Family Psychiatric  History: Positive for anxiety Social History:  History  Alcohol Use No     History  Drug Use No    Social History   Social History  . Marital status: Divorced    Spouse name: N/A  . Number of children: N/A  . Years of education: N/A   Social History Main Topics  . Smoking status: Former Smoker    Packs/day: 0.50    Types: Cigarettes    Quit date: 02/13/2015  . Smokeless tobacco: Never Used  . Alcohol use No  . Drug use:  No  . Sexual activity: Not Asked   Other Topics Concern  . None   Social History Narrative  . None   Additional Social History:    Allergies:   Allergies  Allergen Reactions  . Compazine [Prochlorperazine Edisylate] Anaphylaxis and Nausea And Vomiting    23 Jul - patient relates that she takes promethazine frequently with no problems.  . Ace Inhibitors Swelling  . Ativan [Lorazepam] Other (See Comments)    Reaction:  Hallucinations and headaches  . Codeine Nausea And Vomiting  . Dilaudid [Hydromorphone Hcl] Other (See Comments)    Delirium  . Gabapentin Other (See Comments)    Reaction:  Unknown   . Losartan Other (See Comments)    Reaction:  Unknown   . Ondansetron Other (See Comments)    Reaction:  Unknown   . Prochlorperazine Other (See Comments)    Reaction:  Unknown   . Reglan [Metoclopramide]     Per patient her Dr. Evelina Bucy her off it   . Scopolamine Other (See Comments)    Reaction:  Unknown   . Zofran [Ondansetron Hcl] Other (See Comments)    Reaction:  hallucinations   . Oxycodone Anxiety  . Tape Rash    Labs:  Results for orders placed or performed during the hospital encounter of 04/21/16 (from the past 48 hour(s))  Lipase, blood     Status: Abnormal   Collection Time: 04/21/16  8:43 AM  Result Value Ref Range   Lipase <10 (L) 11 - 51 U/L  Ethanol     Status: None   Collection Time: 04/21/16  8:43 AM  Result Value Ref Range   Alcohol, Ethyl (B) <5 <5 mg/dL    Comment:        LOWEST DETECTABLE LIMIT FOR SERUM ALCOHOL IS 5 mg/dL FOR MEDICAL PURPOSES ONLY   Protime-INR     Status: None   Collection Time: 04/21/16  8:43 AM  Result Value Ref Range   Prothrombin Time 14.0 11.4 - 15.2 seconds   INR 1.08   APTT     Status: Abnormal   Collection Time: 04/21/16  8:43 AM  Result Value Ref Range   aPTT 46 (H) 24 - 36 seconds    Comment:        IF BASELINE aPTT IS ELEVATED, SUGGEST PATIENT RISK ASSESSMENT BE USED TO DETERMINE APPROPRIATE ANTICOAGULANT  THERAPY.   CBC     Status: Abnormal   Collection Time: 04/21/16  8:43 AM  Result Value Ref Range   WBC 7.1 3.6 - 11.0 K/uL   RBC 3.82 3.80 - 5.20 MIL/uL   Hemoglobin 9.3 (L) 12.0 - 16.0 g/dL   HCT 28.3 (L) 35.0 - 47.0 %   MCV 74.2 (L) 80.0 - 100.0 fL   MCH 24.2 (L) 26.0 - 34.0 pg   MCHC 32.6 32.0 - 36.0 g/dL  RDW 22.0 (H) 11.5 - 14.5 %   Platelets 257 150 - 440 K/uL  Differential     Status: None   Collection Time: 04/21/16  8:43 AM  Result Value Ref Range   Neutrophils Relative % 61 %   Lymphocytes Relative 25 %   Monocytes Relative 6 %   Eosinophils Relative 7 %   Basophils Relative 1 %   Neutro Abs 4.3 1.4 - 6.5 K/uL   Lymphs Abs 1.8 1.0 - 3.6 K/uL   Monocytes Absolute 0.4 0.2 - 0.9 K/uL   Eosinophils Absolute 0.5 0 - 0.7 K/uL   Basophils Absolute 0.1 0 - 0.1 K/uL   RBC Morphology POLYCHROMASIA PRESENT     Comment: TARGET CELLS SCHISTOCYTES NOTED ON SMEAR MIXED RBC POPULATION   Comprehensive metabolic panel     Status: Abnormal   Collection Time: 04/21/16  8:43 AM  Result Value Ref Range   Sodium 132 (L) 135 - 145 mmol/L   Potassium 4.4 3.5 - 5.1 mmol/L   Chloride 92 (L) 101 - 111 mmol/L   CO2 28 22 - 32 mmol/L   Glucose, Bld 85 65 - 99 mg/dL   BUN 21 (H) 6 - 20 mg/dL   Creatinine, Ser 4.97 (H) 0.44 - 1.00 mg/dL   Calcium 9.0 8.9 - 10.3 mg/dL   Total Protein 8.4 (H) 6.5 - 8.1 g/dL   Albumin 3.4 (L) 3.5 - 5.0 g/dL   AST 22 15 - 41 U/L   ALT 11 (L) 14 - 54 U/L   Alkaline Phosphatase 102 38 - 126 U/L   Total Bilirubin 0.6 0.3 - 1.2 mg/dL   GFR calc non Af Amer 9 (L) >60 mL/min   GFR calc Af Amer 10 (L) >60 mL/min    Comment: (NOTE) The eGFR has been calculated using the CKD EPI equation. This calculation has not been validated in all clinical situations. eGFR's persistently <60 mL/min signify possible Chronic Kidney Disease.    Anion gap 12 5 - 15  Ammonia     Status: None   Collection Time: 04/21/16  8:43 AM  Result Value Ref Range   Ammonia 27 9 - 35  umol/L  Glucose, capillary     Status: None   Collection Time: 04/21/16  8:43 AM  Result Value Ref Range   Glucose-Capillary 74 65 - 99 mg/dL   Comment 1 Notify RN   Urinalysis complete, with microscopic ([facility] only)     Status: Abnormal   Collection Time: 04/21/16  9:19 AM  Result Value Ref Range   Color, Urine YELLOW (A) YELLOW   APPearance CLOUDY (A) CLEAR   Glucose, UA NEGATIVE NEGATIVE mg/dL   Bilirubin Urine NEGATIVE NEGATIVE   Ketones, ur NEGATIVE NEGATIVE mg/dL   Specific Gravity, Urine 1.010 1.005 - 1.030   Hgb urine dipstick 2+ (A) NEGATIVE   pH 8.0 5.0 - 8.0   Protein, ur 100 (A) NEGATIVE mg/dL   Nitrite NEGATIVE NEGATIVE   Leukocytes, UA 3+ (A) NEGATIVE   RBC / HPF TOO NUMEROUS TO COUNT 0 - 5 RBC/hpf   WBC, UA TOO NUMEROUS TO COUNT 0 - 5 WBC/hpf   Bacteria, UA RARE (A) NONE SEEN   Squamous Epithelial / LPF 0-5 (A) NONE SEEN   WBC Clumps PRESENT    Mucous PRESENT   Urine Drug Screen, Qualitative ([facility] only)     Status: Abnormal   Collection Time: 04/21/16  9:19 AM  Result Value Ref Range   Tricyclic, Ur Screen NONE  DETECTED NONE DETECTED   Amphetamines, Ur Screen NONE DETECTED NONE DETECTED   MDMA (Ecstasy)Ur Screen NONE DETECTED NONE DETECTED   Cocaine Metabolite,Ur [place] NONE DETECTED NONE DETECTED   Opiate, Ur Screen POSITIVE (A) NONE DETECTED   Phencyclidine (PCP) Ur S NONE DETECTED NONE DETECTED   Cannabinoid 50 Ng, Ur [place] NONE DETECTED NONE DETECTED   Barbiturates, Ur Screen NONE DETECTED NONE DETECTED   Benzodiazepine, Ur Scrn NONE DETECTED NONE DETECTED   Methadone Scn, Ur NONE DETECTED NONE DETECTED    Comment: (NOTE) 388  Tricyclics, urine               Cutoff 1000 ng/mL 200  Amphetamines, urine             Cutoff 1000 ng/mL 300  MDMA (Ecstasy), urine           Cutoff 500 ng/mL 400  Cocaine Metabolite, urine       Cutoff 300 ng/mL 500  Opiate, urine                   Cutoff 300 ng/mL 600  Phencyclidine (PCP), urine      Cutoff 25 ng/mL 700  Cannabinoid,  urine              Cutoff 50 ng/mL 800  Barbiturates, urine             Cutoff 200 ng/mL 900  Benzodiazepine, urine           Cutoff 200 ng/mL 1000 Methadone, urine                Cutoff 300 ng/mL 1100 1200 The urine drug screen provides only a preliminary, unconfirmed 1300 analytical test result and should not be used for non-medical 1400 purposes. Clinical consideration and professional judgment should 1500 be applied to any positive drug screen result due to possible 1600 interfering substances. A more specific alternate chemical method 1700 must be used in order to obtain a confirmed analytical result.  1800 Gas chromato graphy / mass spectrometry (GC/MS) is the preferred 1900 confirmatory method.   Urine culture     Status: Abnormal (Preliminary result)   Collection Time: 04/21/16  9:19 AM  Result Value Ref Range   Specimen Description URINE, CLEAN CATCH    Special Requests Normal    Culture >=100,000 COLONIES/mL PROTEUS MIRABILIS (A)    Report Status PENDING   Glucose, capillary     Status: None   Collection Time: 04/21/16  5:29 PM  Result Value Ref Range   Glucose-Capillary 65 65 - 99 mg/dL   Comment 1 Notify RN    Comment 2 Document in Chart   CBC     Status: Abnormal   Collection Time: 04/21/16  5:31 PM  Result Value Ref Range   WBC 6.9 3.6 - 11.0 K/uL   RBC 3.81 3.80 - 5.20 MIL/uL   Hemoglobin 9.3 (L) 12.0 - 16.0 g/dL   HCT 28.0 (L) 35.0 - 47.0 %   MCV 73.5 (L) 80.0 - 100.0 fL   MCH 24.4 (L) 26.0 - 34.0 pg   MCHC 33.2 32.0 - 36.0 g/dL   RDW 22.9 (H) 11.5 - 14.5 %   Platelets 254 150 - 440 K/uL  Creatinine, serum     Status: Abnormal   Collection Time: 04/21/16  5:31 PM  Result Value Ref Range   Creatinine, Ser 5.31 (H) 0.44 - 1.00 mg/dL   GFR calc non Af Amer 8 (L) >60 mL/min  GFR calc Af Amer 10 (L) >60 mL/min    Comment: (NOTE) The eGFR has been calculated using the CKD EPI equation. This calculation has not been validated in all clinical  situations. eGFR's persistently <60 mL/min signify possible Chronic Kidney Disease.   MRSA PCR Screening     Status: None   Collection Time: 04/21/16  6:07 PM  Result Value Ref Range   MRSA by PCR NEGATIVE NEGATIVE    Comment:        The GeneXpert MRSA Assay (FDA approved for NASAL specimens only), is one component of a comprehensive MRSA colonization surveillance program. It is not intended to diagnose MRSA infection nor to guide or monitor treatment for MRSA infections.   Glucose, capillary     Status: Abnormal   Collection Time: 04/21/16  9:28 PM  Result Value Ref Range   Glucose-Capillary 50 (L) 65 - 99 mg/dL  Glucose, capillary     Status: Abnormal   Collection Time: 04/21/16  9:50 PM  Result Value Ref Range   Glucose-Capillary 62 (L) 65 - 99 mg/dL  Glucose, capillary     Status: Abnormal   Collection Time: 04/21/16 10:06 PM  Result Value Ref Range   Glucose-Capillary 64 (L) 65 - 99 mg/dL  Glucose, capillary     Status: Abnormal   Collection Time: 04/21/16 10:47 PM  Result Value Ref Range   Glucose-Capillary 160 (H) 65 - 99 mg/dL  Lipid panel     Status: None   Collection Time: 04/22/16  4:42 AM  Result Value Ref Range   Cholesterol 106 0 - 200 mg/dL   Triglycerides 59 <150 mg/dL   HDL 72 >40 mg/dL   Total CHOL/HDL Ratio 1.5 RATIO   VLDL 12 0 - 40 mg/dL   LDL Cholesterol 22 0 - 99 mg/dL    Comment:        Total Cholesterol/HDL:CHD Risk Coronary Heart Disease Risk Table                     Men   Women  1/2 Average Risk   3.4   3.3  Average Risk       5.0   4.4  2 X Average Risk   9.6   7.1  3 X Average Risk  23.4   11.0        Use the calculated Patient Ratio above and the CHD Risk Table to determine the patient's CHD Risk.        ATP III CLASSIFICATION (LDL):  <100     mg/dL   Optimal  100-129  mg/dL   Near or Above                    Optimal  130-159  mg/dL   Borderline  160-189  mg/dL   High  >190     mg/dL   Very High   Glucose, capillary      Status: Abnormal   Collection Time: 04/22/16  4:42 AM  Result Value Ref Range   Glucose-Capillary 50 (L) 65 - 99 mg/dL  Glucose, capillary     Status: Abnormal   Collection Time: 04/22/16  5:15 AM  Result Value Ref Range   Glucose-Capillary 52 (L) 65 - 99 mg/dL  Glucose, capillary     Status: Abnormal   Collection Time: 04/22/16  5:35 AM  Result Value Ref Range   Glucose-Capillary 57 (L) 65 - 99 mg/dL  Glucose, capillary     Status:  Abnormal   Collection Time: 04/22/16  6:03 AM  Result Value Ref Range   Glucose-Capillary 162 (H) 65 - 99 mg/dL  Glucose, capillary     Status: Abnormal   Collection Time: 04/22/16  7:45 AM  Result Value Ref Range   Glucose-Capillary 107 (H) 65 - 99 mg/dL   Comment 1 Notify RN    Comment 2 Document in Chart   Glucose, capillary     Status: None   Collection Time: 04/22/16  1:07 PM  Result Value Ref Range   Glucose-Capillary 94 65 - 99 mg/dL   Comment 1 Notify RN    Comment 2 Document in Chart     Current Facility-Administered Medications  Medication Dose Route Frequency Provider Last Rate Last Dose  . acidophilus (RISAQUAD) capsule 1 capsule  1 capsule Oral Daily Nicholes Mango, MD   1 capsule at 04/21/16 1736  . albuterol (PROVENTIL) (2.5 MG/3ML) 0.083% nebulizer solution 2.5 mg  2.5 mg Inhalation Q4H PRN Nicholes Mango, MD      . amLODipine (NORVASC) tablet 10 mg  10 mg Oral Daily Nicholes Mango, MD   10 mg at 04/22/16 1413  . aspirin EC tablet 81 mg  81 mg Oral Prudence Davidson, MD   81 mg at 04/22/16 0848  . atorvastatin (LIPITOR) tablet 20 mg  20 mg Oral QPM Nicholes Mango, MD   20 mg at 04/21/16 1736  . butalbital-acetaminophen-caffeine (FIORICET, ESGIC) 50-325-40 MG per tablet 1-2 tablet  1-2 tablet Oral Q6H PRN Harrie Foreman, MD   1 tablet at 04/22/16 1355  . cefTRIAXone (ROCEPHIN) 1 g in dextrose 5 % 50 mL IVPB  1 g Intravenous Once Delman Kitten, MD   Stopped at 04/21/16 1515  . cefTRIAXone (ROCEPHIN) 1 g in dextrose 5 % 50 mL IVPB  1 g Intravenous  Q24H Aruna Gouru, MD      . clopidogrel (PLAVIX) tablet 75 mg  75 mg Oral Daily Nicholes Mango, MD   75 mg at 04/22/16 1355  . FLUoxetine (PROZAC) capsule 20 mg  20 mg Oral Daily Nicholes Mango, MD   20 mg at 04/22/16 1357  . heparin injection 5,000 Units  5,000 Units Subcutaneous Q12H Nicholes Mango, MD   5,000 Units at 04/22/16 1358  . hydrALAZINE (APRESOLINE) tablet 25 mg  25 mg Oral TID Nicholes Mango, MD   25 mg at 04/21/16 2116  . HYDROcodone-acetaminophen (NORCO/VICODIN) 5-325 MG per tablet 1 tablet  1 tablet Oral Q6H PRN Nicholes Mango, MD   1 tablet at 04/22/16 0519  . lipase/protease/amylase (CREON) capsule 24,000 Units  24,000 Units Oral Q lunch Nicholes Mango, MD   24,000 Units at 04/22/16 1356  . lipase/protease/amylase (CREON) capsule 36,000 Units  36,000 Units Oral BID Nicholes Mango, MD   36,000 Units at 04/22/16 0848  . loratadine (CLARITIN) tablet 10 mg  10 mg Oral Daily Nicholes Mango, MD   10 mg at 04/21/16 1736  . morphine 2 MG/ML injection 2 mg  2 mg Intravenous Q4H PRN Lytle Butte, MD   2 mg at 04/22/16 0934  . OLANZapine (ZYPREXA) tablet 5 mg  5 mg Oral QHS Gonzella Lex, MD   5 mg at 04/21/16 2116  . pantoprazole (PROTONIX) EC tablet 40 mg  40 mg Oral BID AC Nicholes Mango, MD   40 mg at 04/22/16 0848  . pregabalin (LYRICA) capsule 25 mg  25 mg Oral Daily Nicholes Mango, MD   25 mg at 04/22/16 1357  . promethazine (  PHENERGAN) injection 12.5 mg  12.5 mg Intravenous Q6H PRN Lytle Butte, MD   12.5 mg at 04/22/16 6767  . ranolazine (RANEXA) 12 hr tablet 1,000 mg  1,000 mg Oral BID Nicholes Mango, MD   1,000 mg at 04/22/16 1356  . rifaximin (XIFAXAN) tablet 550 mg  550 mg Oral BID Nicholes Mango, MD   550 mg at 04/22/16 1355  . sevelamer carbonate (RENVELA) tablet 800 mg  800 mg Oral TID WC Nicholes Mango, MD   800 mg at 04/22/16 1357    Musculoskeletal: Strength & Muscle Tone: decreased Gait & Station: unable to stand Patient leans: N/A  Psychiatric Specialty Exam: Physical Exam  Constitutional: She  appears well-developed and well-nourished.  HENT:  Head: Normocephalic and atraumatic.  Eyes: Conjunctivae are normal. Pupils are equal, round, and reactive to light.  Neck: Normal range of motion.  Cardiovascular: Normal heart sounds.   Respiratory: Effort normal.  GI: Soft.  Musculoskeletal: Normal range of motion.  Neurological: She is alert.  Skin: Skin is warm and dry.  Psychiatric: Judgment normal. Her mood appears anxious. Her speech is delayed. She is slowed. Thought content is not paranoid. Cognition and memory are normal. She expresses no suicidal ideation.    Review of Systems  Constitutional: Positive for malaise/fatigue.  HENT: Negative.   Eyes: Negative.   Respiratory: Negative.   Cardiovascular: Negative.   Gastrointestinal: Negative.   Musculoskeletal: Negative.   Skin: Negative.   Neurological: Negative.   Psychiatric/Behavioral: Positive for hallucinations. Negative for depression, memory loss, substance abuse and suicidal ideas. The patient is nervous/anxious. The patient does not have insomnia.     Blood pressure (!) 125/58, pulse 62, temperature 97.7 F (36.5 C), temperature source Oral, resp. rate 18, height 5' 5.5" (1.664 m), weight 70.3 kg (154 lb 15.7 oz), SpO2 97 %.Body mass index is 25.4 kg/m.  General Appearance: Casual  Eye Contact:  Fair  Speech:  Slow  Volume:  Decreased  Mood:  Euthymic  Affect:  Constricted  Thought Process:  Goal Directed  Orientation:  Full (Time, Place, and Person)  Thought Content:  Logical  Suicidal Thoughts:  No  Homicidal Thoughts:  No  Memory:  Immediate;   Good Recent;   Fair Remote;   Fair  Judgement:  Fair  Insight:  Fair  Psychomotor Activity:  Decreased  Concentration:  Concentration: Fair  Recall:  [employer] of Knowledge:  Fair  Language:  Fair  Akathisia:  No  Handed:  Right  AIMS (if indicated):     Assets:  Communication Skills Desire for Improvement Financial  Resources/Insurance Housing Resilience  ADL's:  Intact  Cognition:  WNL  Sleep:        Treatment Plan Summary: Medication management and Plan Patient seems to of calm down quite a bit. Still complaining of visual hallucinations but is not acting as though she is responding to internal stimuli. No evidence of acute dangerousness. Cooperative with treatment. Tolerating medicine well. Spent some time with supportive therapy with the patient. No change today to medicine. I will continue to follow up as needed. I have tried to plant the seed again in her mind that may be getting back into an assisted living facility would be a better situation for her although I'm not sure if it would make that much difference in the long run.  Disposition: Patient does not meet criteria for psychiatric inpatient admission. Supportive therapy provided about ongoing stressors.  Alethia Berthold, MD 04/22/2016 4:11 PM

## 2016-04-22 NOTE — Progress Notes (Signed)
Pt had two instances where the blood sugar was in the 50s. Given orange juice x2 both times with little improvement after q15 min blood sugar checks. An amp of d50 given both times with improvement. Pt refused both times to eat food. Encouraged to eat during the day.

## 2016-04-22 NOTE — Progress Notes (Signed)
Pt received 4 minutes of HD tx today.  Arterial line clotted immediately.  Arterial pressures too high for tx. Recannulated arterial and pulled an inch long clot from pt access. Kolluru, MD notifed. Pt. To receive HD tomorrow instead of today with heparin pre tx.

## 2016-04-22 NOTE — Care Management (Signed)
Admitted to [facility] with the diagnosis of major depression. Lives with son, [name], [phone]). Appointment with Dr. Quay Burow was suppose to be today  04/22/16. Established dialysis at [facility] on Monday-Wednesday-Friday. Uses [employer] fot transportation. Prescriptions are filled at [facility]. Takes care of all basic activities of daily living herself. Family takes care of errands. [facility] oxygen [facility] [employer]. Followed by [facility] in the home. [place] in the past.  Uses no aids for ambulation. Referral to [facility] program in the past. Family will transport. Shelbie Ammons RN MSN CCM Care Management [phone]

## 2016-04-22 NOTE — Progress Notes (Signed)
Post hd 

## 2016-04-22 NOTE — Evaluation (Signed)
Clinical/Bedside Swallow Evaluation Patient Details  Name: TOLLIE NEWNHAM MRN: [id number] Date of Birth: 1961-07-31  Today's Date: 04/22/2016 Time: SLP Start Time (ACUTE ONLY): 0830 SLP Stop Time (ACUTE ONLY): 0930 SLP Time Calculation (min) (ACUTE ONLY): 60 min  Past Medical History:  Past Medical History:  Diagnosis Date  . Anemia   . Anxiety   . Arthritis   . Asthma   . chronic diastolic CHF [id number]  . Collagen vascular disease ([place])   . COPD (chronic obstructive pulmonary disease) ([place])   . Coronary artery disease    a. cath 2013: stenting to RCA (report not available); b. cath 2014: LM nl, pLAD 40%, mLAD nl, ost LCx 40%, mid LCx nl, pRCA 30% @ site of prior stent, mRCA 50%  . Depression   . Diabetes mellitus without complication ([place])   . Diabetic neuropathy ([place])   . dialysis 2006  . ESRD (end stage renal disease) on dialysis ([place])    M-W-F  . GERD (gastroesophageal reflux disease)   . Headache   . Hx of pancreatitis 2015  . Hypertension   . Mitral regurgitation    a. echo 10/2013: EF 62%, noWMA, mildly dilated LA, mild to mod MR/TR, GR1DD  . Myocardial infarction ([place])   . Pneumonia   . Renal insufficiency    Past Surgical History:  Past Surgical History:  Procedure Laterality Date  . ABDOMINAL HYSTERECTOMY    . APPENDECTOMY    . CARDIAC CATHETERIZATION Left 07/26/2015   Procedure: Left Heart Cath and Coronary Angiography;  Surgeon: Dionisio David, MD;  Location: [place] [facility];  Service: Cardiovascular;  Laterality: Left;  . CHOLECYSTECTOMY    . DIALYSIS FISTULA CREATION Left    upper arm  . ESOPHAGOGASTRODUODENOSCOPY N/A 03/08/2015   Procedure: ESOPHAGOGASTRODUODENOSCOPY (EGD);  Surgeon: Manya Silvas, MD;  Location: [facility] ENDOSCOPY;  Service: Endoscopy;  Laterality: N/A;  . ESOPHAGOGASTRODUODENOSCOPY (EGD) WITH PROPOFOL N/A 03/18/2016   Procedure: ESOPHAGOGASTRODUODENOSCOPY (EGD) WITH PROPOFOL;  Surgeon: [name], MD;  Location: [facility] ENDOSCOPY;  Service:  Endoscopy;  Laterality: N/A;  . EYE SURGERY    . FECAL TRANSPLANT N/A 08/23/2015   Procedure: FECAL TRANSPLANT;  Surgeon: Manya Silvas, MD;  Location: [facility] ENDOSCOPY;  Service: Endoscopy;  Laterality: N/A;  . PERIPHERAL VASCULAR CATHETERIZATION N/A 12/20/2015   Procedure: Thrombectomy of dialysis access versus permcath placement;  Surgeon: Algernon Huxley, MD;  Location: [place] [facility];  Service: Cardiovascular;  Laterality: N/A;  . PERIPHERAL VASCULAR CATHETERIZATION N/A 12/20/2015   Procedure: A/V Shunt Intervention;  Surgeon: Algernon Huxley, MD;  Location: [place] [facility];  Service: Cardiovascular;  Laterality: N/A;  . PERIPHERAL VASCULAR CATHETERIZATION N/A 12/20/2015   Procedure: A/V Shuntogram/Fistulagram;  Surgeon: Algernon Huxley, MD;  Location: [place] [facility];  Service: Cardiovascular;  Laterality: N/A;  . PERIPHERAL VASCULAR CATHETERIZATION N/A 01/02/2016   Procedure: A/V Shuntogram/Fistulagram;  Surgeon: Algernon Huxley, MD;  Location: [place] [facility];  Service: Cardiovascular;  Laterality: N/A;  . PERIPHERAL VASCULAR CATHETERIZATION N/A 01/02/2016   Procedure: A/V Shunt Intervention;  Surgeon: Algernon Huxley, MD;  Location: [place] [facility];  Service: Cardiovascular;  Laterality: N/A;   HPI:  Pt is a 55 y.o. female with multiple medical issues including a known history of HOH(R ear), End-stage renal disease on hemodialysis, chronic encephalopathy, chronic diastolic congestive heart failure, anxiety, COPD, diabetes mellitus HTN, GERD, and anxiety/depression. She was seen by dialysis center today and was sent to the hospital as patient  was nauseous and vomiting also reporting right-sided weakness. During my examination patient was nauseous and reporting body aches t. Urinalysis is abnormal. Patient is a poor historian in view of encephalopathy. She stated she has "swallowing problems sometimes" but could not elaborate. She does endorse GERD symptoms.    Assessment / Plan /  Recommendation Clinical Impression  Pt appears at reduced risk for aspiration following general aspiration precautions w/ oral intake. Pt was experiencing N/V and only took a few po trials for this exam. Pt consumed po trials of thin liquids and purees/soft solid trial w/ no overt s/s of aspiration and no significant oral phase deficits noted. Pt was able to feed self w/ setup. Of note, pt ehxibited belching and does have a h/o GERD. Briefly discussed general Reflux precautions as well as aspiration precautions. Recommend a mech soft diet consistency for easier intake overall; aspiration precautions; meds in Puree for easier swallowing as needed. NSG to reconsult ST services if any change in status.     Aspiration Risk   (reduced; GERD/reflux)    Diet Recommendation  Dysphagia 3 w/ thin liquids (moistened foods); general aspiration precautions. Reflux precautions.   Medication Administration: Whole meds with puree (for easier swallowing )    Other  Recommendations Recommended Consults:  (Dietician f/u) Oral Care Recommendations: Oral care BID;Staff/trained caregiver to provide oral care   Follow up Recommendations  None    Frequency and Duration            Prognosis Prognosis for Safe Diet Advancement: Good Barriers to Reach Goals:  (medical status overall)      Swallow Study   General Date of Onset: 04/21/16 HPI: Pt is a 54 y.o. female with multiple medical issues including a known history of HOH(R ear), End-stage renal disease on hemodialysis, chronic encephalopathy, chronic diastolic congestive heart failure, anxiety, COPD, diabetes mellitus HTN, GERD, and anxiety/depression. She was seen by dialysis center today and was sent to the hospital as patient was nauseous and vomiting also reporting right-sided weakness. During my examination patient was nauseous and reporting body aches t. Urinalysis is abnormal. Patient is a poor historian in view of encephalopathy. She stated she has  "swallowing problems sometimes" but could not elaborate. She does endorse GERD symptoms.  Type of Study: Bedside Swallow Evaluation Previous Swallow Assessment: x1 admission. No change in diet consistency; general aspiration precautions recommended.  Diet Prior to this Study: Regular;Thin liquids (per pt) Temperature Spikes Noted: No (wbc 6.9) Respiratory Status: Nasal cannula (2 liters ) History of Recent Intubation: No Behavior/Cognition: Alert;Cooperative;Pleasant mood (HOH.) Oral Cavity Assessment: Within Functional Limits Oral Care Completed by SLP: Recent completion by staff Oral Cavity - Dentition: Missing dentition;Adequate natural dentition Vision: Functional for self-feeding Self-Feeding Abilities: Able to feed self;Needs assist;Needs set up Patient Positioning: Upright in bed Baseline Vocal Quality: Normal (baseline encephalapthy - min slower response at times) Volitional Cough: Strong Volitional Swallow: Able to elicit    Oral/Motor/Sensory Function Overall Oral Motor/Sensory Function: Within functional limits   Ice Chips Ice chips: Not tested   Thin Liquid Thin Liquid: Within functional limits Presentation: Cup;Self Fed;Straw (10+ trials total)    Nectar Thick Nectar Thick Liquid: Not tested   Honey Thick Honey Thick Liquid: Not tested   Puree Puree: Within functional limits Presentation: Self Fed;Spoon (5 trials)   Solid   GO   Solid: Within functional limits Presentation: Self Fed (1 trial)       [name], MS, CCC-SLP  Amelita Risinger 04/22/2016,10:43 AM

## 2016-04-22 NOTE — Consult Note (Addendum)
CC: ? stroke  HPI: [name] is an 55 y.o. female with history of DM, HTN, CAD, ESRD on HD multiple admissions in past.  Last one was 7/18 for confusion and disorientation.   Pt was sent from dialysis center yesterday for confusion, disorientation, N/V and R sided weakness.    Pt was going to MRI. R side improved.    Past Medical History:  Diagnosis Date  . Anemia   . Anxiety   . Arthritis   . Asthma   . chronic diastolic CHF [id number]  . Collagen vascular disease ([place])   . COPD (chronic obstructive pulmonary disease) ([place])   . Coronary artery disease    a. cath 2013: stenting to RCA (report not available); b. cath 2014: LM nl, pLAD 40%, mLAD nl, ost LCx 40%, mid LCx nl, pRCA 30% @ site of prior stent, mRCA 50%  . Depression   . Diabetes mellitus without complication ([place])   . Diabetic neuropathy ([place])   . dialysis 2006  . ESRD (end stage renal disease) on dialysis ([place])    M-W-F  . GERD (gastroesophageal reflux disease)   . Headache   . Hx of pancreatitis 2015  . Hypertension   . Mitral regurgitation    a. echo 10/2013: EF 62%, noWMA, mildly dilated LA, mild to mod MR/TR, GR1DD  . Myocardial infarction ([place])   . Pneumonia   . Renal insufficiency     Past Surgical History:  Procedure Laterality Date  . ABDOMINAL HYSTERECTOMY    . APPENDECTOMY    . CARDIAC CATHETERIZATION Left 07/26/2015   Procedure: Left Heart Cath and Coronary Angiography;  Surgeon: Dionisio David, MD;  Location: [place] [facility];  Service: Cardiovascular;  Laterality: Left;  . CHOLECYSTECTOMY    . DIALYSIS FISTULA CREATION Left    upper arm  . ESOPHAGOGASTRODUODENOSCOPY N/A 03/08/2015   Procedure: ESOPHAGOGASTRODUODENOSCOPY (EGD);  Surgeon: Manya Silvas, MD;  Location: [facility] END[facility]COPY;  Service: Endoscopy;  Laterality: N/A;  . ESOPHAGOGASTRODUODENOSCOPY (EGD) WITH PROPOFOL N/A 03/18/2016   Procedure: ESOPHAGOGASTRODUODENOSCOPY (EGD) WITH PROPOFOL;  Surgeon: [name], MD;  Location: [facility]  ENDOSCOPY;  Service: Endoscopy;  Laterality: N/A;  . EYE SURGERY    . FECAL TRANSPLANT N/A 08/23/2015   Procedure: FECAL TRANSPLANT;  Surgeon: Manya Silvas, MD;  Location: [facility] ENDOSCOPY;  Service: Endoscopy;  Laterality: N/A;  . PERIPHERAL VASCULAR CATHETERIZATION N/A 12/20/2015   Procedure: Thrombectomy of dialysis access versus permcath placement;  Surgeon: Algernon Huxley, MD;  Location: [name] [facility];  Service: Cardiovascular;  Laterality: N/A;  . PERIPHERAL VASCULAR CATHETERIZATION N/A 12/20/2015   Procedure: A/V Shunt Intervention;  Surgeon: Algernon Huxley, MD;  Location: [place] [facility];  Service: Cardiovascular;  Laterality: N/A;  . PERIPHERAL VASCULAR CATHETERIZATION N/A 12/20/2015   Procedure: A/V Shuntogram/Fistulagram;  Surgeon: Algernon Huxley, MD;  Location: [place] [facility];  Service: Cardiovascular;  Laterality: N/A;  . PERIPHERAL VASCULAR CATHETERIZATION N/A 01/02/2016   Procedure: A/V Shuntogram/Fistulagram;  Surgeon: Algernon Huxley, MD;  Location: [place] [facility];  Service: Cardiovascular;  Laterality: N/A;  . PERIPHERAL VASCULAR CATHETERIZATION N/A 01/02/2016   Procedure: A/V Shunt Intervention;  Surgeon: Algernon Huxley, MD;  Location: [place] [facility];  Service: Cardiovascular;  Laterality: N/A;    Family History  Problem Relation Age of Onset  . Kidney disease Mother   . Diabetes Mother     Social History:  reports that she quit smoking about 14 months ago. Her smoking  use included Cigarettes. She smoked 0.50 packs per day. She has never used smokeless tobacco. She reports that she does not drink alcohol or use drugs.  Allergies  Allergen Reactions  . Compazine [Prochlorperazine Edisylate] Anaphylaxis and Nausea And Vomiting    23 Jul - patient relates that she takes promethazine frequently with no problems.  . Ace Inhibitors Swelling  . Ativan [Lorazepam] Other (See Comments)    Reaction:  Hallucinations and headaches  . Codeine Nausea And Vomiting  .  Dilaudid [Hydromorphone Hcl] Other (See Comments)    Delirium  . Gabapentin Other (See Comments)    Reaction:  Unknown   . Losartan Other (See Comments)    Reaction:  Unknown   . Ondansetron Other (See Comments)    Reaction:  Unknown   . Prochlorperazine Other (See Comments)    Reaction:  Unknown   . Reglan [Metoclopramide]     Per patient her Dr. Evelina Bucy her off it   . Scopolamine Other (See Comments)    Reaction:  Unknown   . Zofran [Ondansetron Hcl] Other (See Comments)    Reaction:  hallucinations   . Oxycodone Anxiety  . Tape Rash    Medications:  Prior to Admission:  Prescriptions Prior to Admission  Medication Sig Dispense Refill Last Dose  . acidophilus (RISAQUAD) CAPS capsule Take 1 capsule by mouth daily.   unknown at unknown  . albuterol (PROVENTIL HFA;VENTOLIN HFA) 108 (90 Base) MCG/ACT inhaler Inhale 2 puffs into the lungs every 4 (four) hours as needed for wheezing or shortness of breath.   unknown at unknown  . albuterol (PROVENTIL) (2.5 MG/3ML) 0.083% nebulizer solution Take 2.5 mg by nebulization every 4 (four) hours as needed for wheezing or shortness of breath.   unknown at unknown  . amLODipine (NORVASC) 10 MG tablet Take 10 mg by mouth daily.   unknown at unknown  . aspirin EC 81 MG tablet Take 81 mg by mouth every morning.    unknown at unknown  . atorvastatin (LIPITOR) 20 MG tablet Take 20 mg by mouth every evening.   unknown at unknown  . cetirizine (ZYRTEC) 10 MG tablet Take 10 mg by mouth daily.    unknown at unknown  . clopidogrel (PLAVIX) 75 MG tablet Take 75 mg by mouth daily. Reported on 12/17/2015    at unknown  . FLUoxetine (PROZAC) 20 MG capsule Take 1 capsule (20 mg total) by mouth daily. 30 capsule 0 unknown at unknown  . glucose 4 GM chewable tablet Chew 3-4 tablets by mouth as needed for low blood sugar.   unknown at unknown  . hydrALAZINE (APRESOLINE) 25 MG tablet Take 25 mg by mouth 3 (three) times daily.   unknown at unknown  .  HYDROcodone-acetaminophen (NORCO/VICODIN) 5-325 MG tablet Take 1 tablet by mouth every 6 (six) hours as needed for moderate pain or severe pain. (Patient taking differently: Take 1 tablet by mouth 2 (two) times daily as needed for moderate pain or severe pain. ) 20 tablet 0 unknown at unknown  . lipase/protease/amylase (CREON) 12000 UNITS CPEP capsule Take 12,000-36,000 Units by mouth See admin instructions. 36,000 units daily with breakfast; 24,000 units daily with lunch, and 36,000 units daily with dinner   unknown at unknown  . multivitamin (RENA-VIT) TABS tablet Take 1 tablet by mouth daily.   unknown at unknown  . OLANZapine (ZYPREXA) 2.5 MG tablet Take 1 tablet (2.5 mg total) by mouth at bedtime. 30 tablet 0 unknown at unknown  . omega-3 acid ethyl  esters (LOVAZA) 1 g capsule Take 1 g by mouth daily.   unknown at unknown  . pantoprazole (PROTONIX) 40 MG tablet Take 1 tablet (40 mg total) by mouth 2 (two) times daily before a meal. 60 tablet 2 unknown at unknown  . promethazine (PHENERGAN) 12.5 MG tablet Take 12.5 mg by mouth every 8 (eight) hours as needed for nausea.   unknown at unknown  . ranitidine (ZANTAC) 150 MG tablet Take 150 mg by mouth daily.    unknown at unknown  . ranolazine (RANEXA) 1000 MG SR tablet Take 1,000 mg by mouth 2 (two) times daily.   unknown at unknown  . rifaximin (XIFAXAN) 550 MG TABS tablet Take 1 tablet (550 mg total) by mouth 2 (two) times daily. 60 tablet 0 unknown at unknown  . sevelamer carbonate (RENVELA) 800 MG tablet Take 800 mg by mouth 3 (three) times daily with meals.     at unknown  . vitamin B-12 (CYANOCOBALAMIN) 1000 MCG tablet Take 1,000 mcg by mouth daily.   unknown at unknown  . pregabalin (LYRICA) 25 MG capsule Take 25 mg by mouth daily.   unknown at unknown    ROS: History obtained from the patient  General ROS: negative for - chills, fatigue, fever, night sweats, weight gain or weight loss Psychological ROS: negative for - behavioral disorder,  hallucinations, memory difficulties, mood swings or suicidal ideation Ophthalmic ROS: negative for - blurry vision, double vision, eye pain or loss of vision ENT ROS: negative for - epistaxis, nasal discharge, oral lesions, sore throat, tinnitus or vertigo Allergy and Immunology ROS: negative for - hives or itchy/watery eyes Hematological and Lymphatic ROS: negative for - bleeding problems, bruising or swollen lymph nodes Endocrine ROS: negative for - galactorrhea, hair pattern changes, polydipsia/polyuria or temperature intolerance Respiratory ROS: negative for - cough, hemoptysis, shortness of breath or wheezing Cardiovascular ROS: negative for - chest pain, dyspnea on exertion, edema or irregular heartbeat Gastrointestinal ROS: negative for - abdominal pain, diarrhea, hematemesis, nausea/vomiting or stool incontinence Genito-Urinary ROS: negative for - dysuria, hematuria, incontinence or urinary frequency/urgency Musculoskeletal ROS: negative for - joint swelling or muscular weakness Neurological ROS: as noted in HPI Dermatological ROS: negative for rash and skin lesion changes  Physical Examination: Blood pressure (!) 132/50, pulse 63, temperature 97.6 F (36.4 C), temperature source Oral, resp. rate 17, height 5' 5.5" (1.664 m), weight 69.9 kg (154 lb), SpO2 95 %.   Neurological Examination Mental Status: Alert, oriented, thought content appropriate.  Speech fluent without evidence of aphasia.  Able to follow 3 step commands without difficulty. Cranial Nerves: II: Discs flat bilaterally; Visual fields grossly normal, pupils equal, round, reactive to light and accommodation III,IV, VI: ptosis not present, extra-ocular motions intact bilaterally V,VII: smile symmetric, facial light touch sensation normal bilaterally VIII: hearing normal bilaterally IX,X: gag reflex present XI: bilateral shoulder shrug XII: midline tongue extension Motor: Right : Upper extremity   5/5    Left:      Upper extremity   5/5  Lower extremity   4/5     Lower extremity   4/5 Tone and bulk:normal tone throughout; no atrophy noted Sensory: Pinprick and light touch intact throughout, bilaterally Deep Tendon Reflexes: 1+ and symmetric throughout Plantars: Right: downgoing   Left: downgoing Cerebellar: Not tested Gait: normal gait and station      Laboratory Studies:   Basic Metabolic Panel:  Recent Labs Lab 04/21/16 0843 04/21/16 1731  NA 132*  --   K 4.4  --  CL 92*  --   CO2 28  --   GLUCOSE 85  --   BUN 21*  --   CREATININE 4.97* 5.31*  CALCIUM 9.0  --     Liver Function Tests:  Recent Labs Lab 04/21/16 0843  AST 22  ALT 11*  ALKPHOS 102  BILITOT 0.6  PROT 8.4*  ALBUMIN 3.4*    Recent Labs Lab 04/21/16 0843  LIPASE <10*    Recent Labs Lab 04/21/16 0843  AMMONIA 27    CBC:  Recent Labs Lab 04/21/16 0843 04/21/16 1731  WBC 7.1 6.9  NEUTROABS 4.3  --   HGB 9.3* 9.3*  HCT 28.3* 28.0*  MCV 74.2* 73.5*  PLT 257 254    Cardiac Enzymes: No results for input(s): CKTOTAL, CKMB, CKMBINDEX, TROPONINI in the last 168 hours.  BNP: Invalid input(s): POCBNP  CBG:  Recent Labs Lab 04/22/16 0442 04/22/16 0515 04/22/16 0535 04/22/16 0603 04/22/16 0745  GLUCAP 50* 20* 16* 162* 107*    Microbiology: Results for orders placed or performed during the hospital encounter of 04/21/16  MRSA PCR Screening     Status: None   Collection Time: 04/21/16  6:07 PM  Result Value Ref Range Status   MRSA by PCR NEGATIVE NEGATIVE Final    Comment:        The GeneXpert MRSA Assay (FDA approved for NASAL specimens only), is one component of a comprehensive MRSA colonization surveillance program. It is not intended to diagnose MRSA infection nor to guide or monitor treatment for MRSA infections.     Coagulation Studies:  Recent Labs  04/21/16 0843  LABPROT 14.0  INR 1.08    Urinalysis:  Recent Labs Lab 04/21/16 0919  COLORURINE YELLOW*   LABSPEC 1.010  PHURINE 8.0  GLUCOSEU NEGATIVE  HGBUR 2+*  BILIRUBINUR NEGATIVE  KETONESUR NEGATIVE  PROTEINUR 100*  NITRITE NEGATIVE  LEUKOCYTESUR 3+*    Lipid Panel:     Component Value Date/Time   CHOL 106 04/22/2016 0442   CHOL 85 05/31/2014 0432   TRIG 59 04/22/2016 0442   TRIG 104 05/31/2014 0432   HDL 72 04/22/2016 0442   HDL 43 05/31/2014 0432   CHOLHDL 1.5 04/22/2016 0442   VLDL 12 04/22/2016 0442   VLDL 21 05/31/2014 0432   LDLCALC 22 04/22/2016 0442   LDLCALC 21 05/31/2014 0432    HgbA1C:  Lab Results  Component Value Date   HGBA1C 5.1 04/08/2016    Urine Drug Screen:     Component Value Date/Time   LABOPIA POSITIVE (A) 04/21/2016 0919   COCAINSCRNUR NONE DETECTED 04/21/2016 0919   LABBENZ NONE DETECTED 04/21/2016 0919   AMPHETMU NONE DETECTED 04/21/2016 0919   THCU NONE DETECTED 04/21/2016 0919   LABBARB NONE DETECTED 04/21/2016 0919    Alcohol Level:  Recent Labs Lab 04/21/16 0843  ETH <5     Imaging: Ct Abdomen Pelvis Wo Contrast  Result Date: 04/21/2016 CLINICAL DATA:  Right-sided abdominal pain. EXAM: CT ABDOMEN AND PELVIS WITHOUT CONTRAST TECHNIQUE: Multidetector CT imaging of the abdomen and pelvis was performed following the standard protocol without IV contrast. COMPARISON:  CT scan of March 15, 2016. FINDINGS: There is continued presence of loculated pleural effusions in both visualized lung bases. No significant osseous abnormality is noted. Status post cholecystectomy. No focal abnormality is noted in the liver, spleen or pancreas on these unenhanced images. Adrenal glands are unremarkable. Bilateral renal atrophy is noted. Extensive atherosclerotic calcifications are seen involving the abdominal aorta and mesenteric and renal  arteries. There is no evidence of bowel obstruction. Diverticulosis of sigmoid colon is noted without inflammation. No abnormal fluid collection is noted. Urinary bladder appears normal. Bilateral inguinal adenopathy  is noted which appears to be slightly enlarged compared to prior exam, with the largest lymph nodes measuring 1.3 cm bilaterally. Moderate anasarca is noted. IMPRESSION: Continued presence of loculated pleural effusion in both lung bases. Bilateral renal atrophy. Aortic atherosclerosis. Diverticulosis of sigmoid colon without inflammation. Moderate anasarca. Bilateral inguinal adenopathy is noted which is slightly enlarged compared to prior exam. This most likely is inflammatory or reactive in etiology, but neoplasm cannot be excluded. Clinical correlation is recommended. Electronically Signed   By: Marijo Conception, M.D.   On: 04/21/2016 11:51  Ct Head Code Stroke W/o Cm  Result Date: 04/21/2016 CLINICAL DATA:  Right-sided weakness. EXAM: CT HEAD WITHOUT CONTRAST TECHNIQUE: Contiguous axial images were obtained from the base of the skull through the vertex without intravenous contrast. COMPARISON:  04/08/2016 FINDINGS: Normal appearance of the brain. No evidence of acute infarct, hemorrhage, hydrocephalus, or mass lesion. No hyperdense vessel. Calcified atherosclerosis in the carotid and vertebral circulation. Negative skull and visualized orbits. Clear visualized paranasal sinuses. ASPECTS [facility] Stroke Program Early CT Score, [email or web address]) - Ganglionic level infarction (caudate, lentiform nuclei, internal capsule, insula, M1-M3 cortex): 7 - Supraganglionic infarction (M4-M6 cortex): 3 Total score (0-10): 10 These results were called by telephone at the time of interpretation on 04/21/2016 at 8:47 am to Dr. Delman Kitten , who verbally acknowledged these results. IMPRESSION: 1. Negative for hemorrhage or visible infarct. 2. ASPECTS score 10. Electronically Signed   By: Monte Fantasia M.D.   On: 04/21/2016 08:48    Assessment/Plan:  55 y.o. female with history of DM, HTN, CAD, ESRD on HD multiple admissions in past.  Last one was 7/18 for confusion and disorientation.   Pt was sent from  dialysis center yesterday for confusion, disorientation, N/V and R sided weakness.    Pt was going to MRI. R side improved but is still confused.      Addendum:  MRI no acute abnormalities. Symptoms likely metabolic related.  No further neurological work up. HD as per primary team Please call with questions  04/22/2016, 9:53 AM

## 2016-04-22 NOTE — Progress Notes (Signed)
Hd stopped

## 2016-04-22 NOTE — Progress Notes (Signed)
Hd start 

## 2016-04-22 NOTE — Progress Notes (Signed)
*  PRELIMINARY RESULTS* Echocardiogram 2D Echocardiogram has been performed.  Sherrie Sport 04/22/2016, 12:42 PM

## 2016-04-22 NOTE — Progress Notes (Signed)
OT Cancellation Note  Patient Details Name: [name] MRN: [id number] DOB: [birth year]   Cancelled Treatment:    Reason Eval/Treat Not Completed: Patient declined, no reason specified. Order received and chart reviewed. Pt just resturned from dialysis which was unsuccessful due to blood clots and planned again for tomorrow.  Pt was feeling nauseated and started to vomit and declined OT at this time.  Will attempt evaluation again tomorrow.  Chrys Racer, OTR/L ascom [phone] 04/22/16, 2:44 PM

## 2016-04-22 NOTE — Progress Notes (Signed)
Pre HD vitals. 

## 2016-04-22 NOTE — Progress Notes (Signed)
[name] MRN: [id number] DOB: 03/12/1961   Cancelled Treatment:    Reason Eval/Treat Not Completed: Patient at procedure or test/unavailable (Evaluation re-attempted.  Patient currently off unit for dialysis.  Will re-attempt next date as patient available and medically appropriate.)   Joesiah Lonon H. Owens Shark, PT, DPT, NCS 04/22/16, 11:39 AM [phone]

## 2016-04-22 NOTE — Progress Notes (Signed)
Subjective:   Missed dialysis yesterday. Dialysis for later today. Continues to complain of abdominal pain.    Objective:  Vital signs in last 24 hours:  Temp:  [97.6 F (36.4 C)-98 F (36.7 C)] 97.6 F (36.4 C) (08/01 0453) Pulse Rate:  [59-68] 63 (08/01 0453) Resp:  [15-27] 17 (08/01 0453) BP: (123-155)/(49-59) 132/50 (08/01 0453) SpO2:  [92 %-100 %] 95 % (08/01 0453) Weight:  [69.9 kg (154 lb)-70.2 kg (154 lb 12.8 oz)] 69.9 kg (154 lb) (07/31 1603)  Weight change:  Filed Weights   04/21/16 0710 04/21/16 1550 04/21/16 1603  Weight: 68.5 kg (151 lb) 70.2 kg (154 lb 12.8 oz) 69.9 kg (154 lb)    Intake/Output:   No intake or output data in the 24 hours ending 04/22/16 1126   Physical Exam: General: Laying in bed  HEENT Anicteric. Slight hearing impairment.   Neck Supple  Pulm/lungs Clear to auscultation bilaterally.  CVS/Heart Soft systolic murmur. Regular rhythm.   Abdomen:  +guarded  Extremities: No peripheral edema.  Neurologic: Alert and able to answer questions appropriately.  Skin: No rashes.  Access: Left AV fistula.        Basic Metabolic Panel:   Recent Labs Lab 04/21/16 0843 04/21/16 1731  NA 132*  --   K 4.4  --   CL 92*  --   CO2 28  --   GLUCOSE 85  --   BUN 21*  --   CREATININE 4.97* 5.31*  CALCIUM 9.0  --      CBC:  Recent Labs Lab 04/21/16 0843 04/21/16 1731  WBC 7.1 6.9  NEUTROABS 4.3  --   HGB 9.3* 9.3*  HCT 28.3* 28.0*  MCV 74.2* 73.5*  PLT 257 254      Microbiology:  Recent Results (from the past 720 hour(s))  Blood culture (routine x 2)     Status: None   Collection Time: 04/08/16  8:06 AM  Result Value Ref Range Status   Specimen Description BLOOD  Final   Special Requests BOTTLES DRAWN AEROBIC AND ANAEROBIC [place]  Final   Culture NO GROWTH 5 DAYS  Final   Report Status 04/13/2016 FINAL  Final  Urine culture     Status: None   Collection Time: 04/10/16 11:14 AM  Result Value Ref Range Status    Specimen Description URINE, RANDOM  Final   Special Requests NONE  Final   Culture NO GROWTH Performed at [facility]   Final   Report Status 04/12/2016 FINAL  Final  MRSA PCR Screening     Status: None   Collection Time: 04/21/16  6:07 PM  Result Value Ref Range Status   MRSA by PCR NEGATIVE NEGATIVE Final    Comment:        The GeneXpert MRSA Assay (FDA approved for NASAL specimens only), is one component of a comprehensive MRSA colonization surveillance program. It is not intended to diagnose MRSA infection nor to guide or monitor treatment for MRSA infections.     Coagulation Studies:  Recent Labs  04/21/16 0843  LABPROT 14.0  INR 1.08    Urinalysis:  Recent Labs  04/21/16 0919  COLORURINE YELLOW*  LABSPEC 1.010  PHURINE 8.0  GLUCOSEU NEGATIVE  HGBUR 2+*  BILIRUBINUR NEGATIVE  KETONESUR NEGATIVE  PROTEINUR 100*  NITRITE NEGATIVE  LEUKOCYTESUR 3+*      Imaging: Ct Abdomen Pelvis Wo Contrast  Result Date: 04/21/2016 CLINICAL DATA:  Right-sided abdominal pain. EXAM: CT ABDOMEN AND PELVIS WITHOUT CONTRAST TECHNIQUE:  Multidetector CT imaging of the abdomen and pelvis was performed following the standard protocol without IV contrast. COMPARISON:  CT scan of March 15, 2016. FINDINGS: There is continued presence of loculated pleural effusions in both visualized lung bases. No significant osseous abnormality is noted. Status post cholecystectomy. No focal abnormality is noted in the liver, spleen or pancreas on these unenhanced images. Adrenal glands are unremarkable. Bilateral renal atrophy is noted. Extensive atherosclerotic calcifications are seen involving the abdominal aorta and mesenteric and renal arteries. There is no evidence of bowel obstruction. Diverticulosis of sigmoid colon is noted without inflammation. No abnormal fluid collection is noted. Urinary bladder appears normal. Bilateral inguinal adenopathy is noted which appears to be slightly  enlarged compared to prior exam, with the largest lymph nodes measuring 1.3 cm bilaterally. Moderate anasarca is noted. IMPRESSION: Continued presence of loculated pleural effusion in both lung bases. Bilateral renal atrophy. Aortic atherosclerosis. Diverticulosis of sigmoid colon without inflammation. Moderate anasarca. Bilateral inguinal adenopathy is noted which is slightly enlarged compared to prior exam. This most likely is inflammatory or reactive in etiology, but neoplasm cannot be excluded. Clinical correlation is recommended. Electronically Signed   By: Marijo Conception, M.D.   On: 04/21/2016 11:51  Mr Brain Wo Contrast  Result Date: 04/22/2016 CLINICAL DATA:  End-stage renal disease patient. Chronic encephalopathy. Nausea, vomiting and right-sided weakness. EXAM: MRI HEAD WITHOUT CONTRAST MRA HEAD WITHOUT CONTRAST TECHNIQUE: Multiplanar, multiecho pulse sequences of the brain and surrounding structures were obtained without intravenous contrast. Angiographic images of the head were obtained using MRA technique without contrast. COMPARISON:  CT 04/21/2016.  MRI 12/04/2009. FINDINGS: MRI HEAD FINDINGS Diffusion imaging does not show any acute or subacute infarction. There are old small vessel infarctions affecting the pons. No cerebellar insult. Cerebral hemispheres show mild chronic small-vessel ischemic change of the deep white matter. No cortical or large vessel territory infarction. No mass lesion, hemorrhage, hydrocephalus or extra-axial collection. No pituitary mass. No inflammatory sinus disease. MRA HEAD FINDINGS Motion degraded exam. Both internal carotid arteries are patent into the brain. Siphon regions are not well evaluated because of motion. The anterior and middle cerebral vessels show flow. Both vertebral arteries are patent to the basilar. The basilar is patent. Major posterior circulation branch vessels are patent. IMPRESSION: MRI head: No acute finding. Old small vessel infarctions  affecting the pons. Minimal small vessel change affecting the cerebral hemispheric white matter. MRA head: Severely motion degraded study. No major vessel occlusion. Electronically Signed   By: Nelson Chimes M.D.   On: 04/22/2016 10:26   US Carotid Bilateral  Result Date: 04/22/2016 CLINICAL DATA:  TIAs EXAM: BILATERAL CAROTID DUPLEX ULTRASOUND TECHNIQUE: Pearline Cables scale imaging, color Doppler and duplex ultrasound were performed of bilateral carotid and vertebral arteries in the neck. COMPARISON:  None. FINDINGS: Criteria: Quantification of carotid stenosis is based on velocity parameters that correlate the residual internal carotid diameter with NASCET-based stenosis levels, using the diameter of the distal internal carotid lumen as the denominator for stenosis measurement. The following velocity measurements were obtained: RIGHT ICA:  357/67 cm/sec CCA:  [id number] cm/sec SYSTOLIC ICA/CCA RATIO:  5.4 DIASTOLIC ICA/CCA RATIO:  9.4 ECA:  113 cm/sec LEFT ICA:  188/16 cm/sec CCA:  [id number] cm/sec SYSTOLIC ICA/CCA RATIO:  2.4 DIASTOLIC ICA/CCA RATIO:  2.3 ECA:  113 cm/sec RIGHT CAROTID ARTERY: Preliminary grayscale images demonstrate significant calcific plaque in the region of the carotid bulb and proximal internal carotid artery. The waveforms, velocities and flow velocity ratios indicate a high-grade stenosis.  RIGHT VERTEBRAL ARTERY:  Antegrade in nature. LEFT CAROTID ARTERY: Preliminary grayscale images demonstrate atherosclerotic plaque in the carotid bulb and proximal internal carotid artery. The waveforms, velocities and flow velocity ratios show evidence of stenosis in the 50-69% range. LEFT VERTEBRAL ARTERY:  Antegrade in nature. IMPRESSION: Bilateral carotid stenosis much worse on the right than the left as described. Vascular surgery consultation is recommended Electronically Signed   By: Inez Catalina M.D.   On: 04/22/2016 11:22   Mr Jodene Nam Head/brain [id number] Cm  Result Date: 04/22/2016 CLINICAL DATA:  End-stage renal  disease patient. Chronic encephalopathy. Nausea, vomiting and right-sided weakness. EXAM: MRI HEAD WITHOUT CONTRAST MRA HEAD WITHOUT CONTRAST TECHNIQUE: Multiplanar, multiecho pulse sequences of the brain and surrounding structures were obtained without intravenous contrast. Angiographic images of the head were obtained using MRA technique without contrast. COMPARISON:  CT 04/21/2016.  MRI 12/04/2009. FINDINGS: MRI HEAD FINDINGS Diffusion imaging does not show any acute or subacute infarction. There are old small vessel infarctions affecting the pons. No cerebellar insult. Cerebral hemispheres show mild chronic small-vessel ischemic change of the deep white matter. No cortical or large vessel territory infarction. No mass lesion, hemorrhage, hydrocephalus or extra-axial collection. No pituitary mass. No inflammatory sinus disease. MRA HEAD FINDINGS Motion degraded exam. Both internal carotid arteries are patent into the brain. Siphon regions are not well evaluated because of motion. The anterior and middle cerebral vessels show flow. Both vertebral arteries are patent to the basilar. The basilar is patent. Major posterior circulation branch vessels are patent. IMPRESSION: MRI head: No acute finding. Old small vessel infarctions affecting the pons. Minimal small vessel change affecting the cerebral hemispheric white matter. MRA head: Severely motion degraded study. No major vessel occlusion. Electronically Signed   By: Nelson Chimes M.D.   On: 04/22/2016 10:26   Ct Head Code Stroke W/o Cm  Result Date: 04/21/2016 CLINICAL DATA:  Right-sided weakness. EXAM: CT HEAD WITHOUT CONTRAST TECHNIQUE: Contiguous axial images were obtained from the base of the skull through the vertex without intravenous contrast. COMPARISON:  04/08/2016 FINDINGS: Normal appearance of the brain. No evidence of acute infarct, hemorrhage, hydrocephalus, or mass lesion. No hyperdense vessel. Calcified atherosclerosis in the carotid and  vertebral circulation. Negative skull and visualized orbits. Clear visualized paranasal sinuses. ASPECTS [facility] Stroke Program Early CT Score, [email or web address]) - Ganglionic level infarction (caudate, lentiform nuclei, internal capsule, insula, M1-M3 cortex): 7 - Supraganglionic infarction (M4-M6 cortex): 3 Total score (0-10): 10 These results were called by telephone at the time of interpretation on 04/21/2016 at 8:47 am to Dr. Delman Kitten , who verbally acknowledged these results. IMPRESSION: 1. Negative for hemorrhage or visible infarct. 2. ASPECTS score 10. Electronically Signed   By: Monte Fantasia M.D.   On: 04/21/2016 08:48    Medications:     . acidophilus  1 capsule Oral Daily  . amLODipine  10 mg Oral Daily  . aspirin EC  81 mg Oral BH-q7a  . atorvastatin  20 mg Oral QPM  . cefTRIAXone (ROCEPHIN)  IV  1 g Intravenous Once  . cefTRIAXone (ROCEPHIN)  IV  1 g Intravenous Q24H  . clopidogrel  75 mg Oral Daily  . famotidine  10 mg Oral BID  . FLUoxetine  20 mg Oral Daily  . heparin subcutaneous  5,000 Units Subcutaneous Q12H  . hydrALAZINE  25 mg Oral TID  . lipase/protease/amylase  24,000 Units Oral Q lunch  . lipase/protease/amylase  36,000 Units Oral BID  . loratadine  10 mg Oral Daily  .  OLANZapine  5 mg Oral QHS  . pantoprazole  40 mg Oral BID AC  . pregabalin  25 mg Oral Daily  . ranolazine  1,000 mg Oral BID  . rifaximin  550 mg Oral BID  . sevelamer carbonate  800 mg Oral TID WC   albuterol, butalbital-acetaminophen-caffeine, HYDROcodone-acetaminophen, morphine injection, promethazine  Assessment/ Plan:  56 y.o. female with insulin dependent diabetes, hypertension, hyperlipidemia, diabetic retinopathy, diabetic neuropathy, diabetic gastroparesis, pancreatic insufficiency, depression, GERD. Renal masses - avoiding epo   MWF [facility] [address]   1. End Stage Renal Disease: dialysis for today Then resume MWF schedule.   2. Anemia of chronic kidney  disease: hemoglobin not at goal.  - Not getting erythropoeitin stimulating agent as outpatient due to renal masses. However CT from 6/24 does not seem to show any renal masses.   3. Secondary Hyperparathyroidism:  - Continue sevelamer  4. Hypertension: at goal.   - amlodipine and hydralazine.     LOS: [place], [place] 8/1/201711:26 AM

## 2016-04-22 NOTE — Progress Notes (Signed)
[facility] NAME: [name]    MRN#:  [id number]  DATE OF BIRTH:  Feb 18, 1961  SUBJECTIVE:  Hospital Day: 1 day [name] is a 55 y.o. female presenting with Abdominal Pain and Emesis .   Overnight events: No Overnight events Interval Events: Patient went to dialysis today unable to complete, blood clot issue with excess  REVIEW OF SYSTEMS:  CONSTITUTIONAL: No fever, fatigue or weakness.  EYES: No blurred or double vision.  EARS, NOSE, AND THROAT: No tinnitus or ear pain.  RESPIRATORY: No cough, shortness of breath, wheezing or hemoptysis.  CARDIOVASCULAR: No chest pain, orthopnea, edema.  GASTROINTESTINAL: Positive nausea, vomiting, denies diarrhea or abdominal pain.  GENITOURINARY: No dysuria, hematuria.  ENDOCRINE: No polyuria, nocturia,  HEMATOLOGY: No anemia, easy bruising or bleeding SKIN: No rash or lesion. MUSCULOSKELETAL: No joint pain or arthritis.   NEUROLOGIC: No tingling, numbness, weakness.  PSYCHIATRY: No anxiety or depression.   DRUG ALLERGIES:   Allergies  Allergen Reactions  . Compazine [Prochlorperazine Edisylate] Anaphylaxis and Nausea And Vomiting    23 Jul - patient relates that she takes promethazine frequently with no problems.  . Ace Inhibitors Swelling  . Ativan [Lorazepam] Other (See Comments)    Reaction:  Hallucinations and headaches  . Codeine Nausea And Vomiting  . Dilaudid [Hydromorphone Hcl] Other (See Comments)    Delirium  . Gabapentin Other (See Comments)    Reaction:  Unknown   . Losartan Other (See Comments)    Reaction:  Unknown   . Ondansetron Other (See Comments)    Reaction:  Unknown   . Prochlorperazine Other (See Comments)    Reaction:  Unknown   . Reglan [Metoclopramide]     Per patient her Dr. Evelina Bucy her off it   . Scopolamine Other (See Comments)    Reaction:  Unknown   . Zofran [Ondansetron Hcl] Other (See Comments)    Reaction:  hallucinations   . Oxycodone  Anxiety  . Tape Rash    VITALS:  Blood pressure (!) 125/58, pulse 62, temperature 97.7 F (36.5 C), temperature source Oral, resp. rate 18, height 5' 5.5" (1.664 m), weight 154 lb 15.7 oz (70.3 kg), SpO2 97 %.  PHYSICAL EXAMINATION:  VITAL SIGNS: Vitals:   04/22/16 1307 04/22/16 1411  BP: (!) 126/47 (!) 125/58  Pulse: 60 62  Resp: 18   Temp:     GENERAL:54 y.o.female currently in no acute distress.  HEAD: Normocephalic, atraumatic.  EYES: Pupils equal, round, reactive to light. Extraocular muscles intact. No scleral icterus.  MOUTH: Moist mucosal membrane. Dentition intact. No abscess noted.  EAR, NOSE, THROAT: Clear without exudates. No external lesions.  NECK: Supple. No thyromegaly. No nodules. No JVD.  PULMONARY: Clear to ascultation, without wheeze rails or rhonci. No use of accessory muscles, Good respiratory effort. good air entry bilaterally CHEST: Nontender to palpation.  CARDIOVASCULAR: S1 and S2. Regular rate and rhythm. No murmurs, rubs, or gallops. No edema. Pedal pulses 2+ bilaterally.  GASTROINTESTINAL: Soft, nontender, nondistended. No masses. Positive bowel sounds. No hepatosplenomegaly.  MUSCULOSKELETAL: No swelling, clubbing, or edema. Range of motion full in all extremities.  NEUROLOGIC: Cranial nerves II through XII are intact. No gross focal neurological deficits. Sensation intact. Reflexes intact.  SKIN: No ulceration, lesions, rashes, or cyanosis. Skin warm and dry. Turgor intact.  PSYCHIATRIC: Mood, affect within normal limits. The patient is awake, alert and oriented x 3. Insight, judgment intact. Circumferential speech pattern  LABORATORY PANEL:   CBC  Recent Labs Lab 04/21/16 1731  WBC 6.9  HGB 9.3*  HCT 28.0*  PLT 254   ------------------------------------------------------------------------------------------------------------------  Chemistries   Recent Labs Lab 04/21/16 0843 04/21/16 1731  NA 132*  --   K 4.4  --   CL 92*  --    CO2 28  --   GLUCOSE 85  --   BUN 21*  --   CREATININE 4.97* 5.31*  CALCIUM 9.0  --   AST 22  --   ALT 11*  --   ALKPHOS 102  --   BILITOT 0.6  --    ------------------------------------------------------------------------------------------------------------------  Cardiac Enzymes No results for input(s): TROPONINI in the last 168 hours. ------------------------------------------------------------------------------------------------------------------  RADIOLOGY:  Ct Abdomen Pelvis Wo Contrast  Result Date: 04/21/2016 CLINICAL DATA:  Right-sided abdominal pain. EXAM: CT ABDOMEN AND PELVIS WITHOUT CONTRAST TECHNIQUE: Multidetector CT imaging of the abdomen and pelvis was performed following the standard protocol without IV contrast. COMPARISON:  CT scan of March 15, 2016. FINDINGS: There is continued presence of loculated pleural effusions in both visualized lung bases. No significant osseous abnormality is noted. Status post cholecystectomy. No focal abnormality is noted in the liver, spleen or pancreas on these unenhanced images. Adrenal glands are unremarkable. Bilateral renal atrophy is noted. Extensive atherosclerotic calcifications are seen involving the abdominal aorta and mesenteric and renal arteries. There is no evidence of bowel obstruction. Diverticulosis of sigmoid colon is noted without inflammation. No abnormal fluid collection is noted. Urinary bladder appears normal. Bilateral inguinal adenopathy is noted which appears to be slightly enlarged compared to prior exam, with the largest lymph nodes measuring 1.3 cm bilaterally. Moderate anasarca is noted. IMPRESSION: Continued presence of loculated pleural effusion in both lung bases. Bilateral renal atrophy. Aortic atherosclerosis. Diverticulosis of sigmoid colon without inflammation. Moderate anasarca. Bilateral inguinal adenopathy is noted which is slightly enlarged compared to prior exam. This most likely is inflammatory or  reactive in etiology, but neoplasm cannot be excluded. Clinical correlation is recommended. Electronically Signed   By: Marijo Conception, M.D.   On: 04/21/2016 11:51  Mr Brain Wo Contrast  Result Date: 04/22/2016 CLINICAL DATA:  End-stage renal disease patient. Chronic encephalopathy. Nausea, vomiting and right-sided weakness. EXAM: MRI HEAD WITHOUT CONTRAST MRA HEAD WITHOUT CONTRAST TECHNIQUE: Multiplanar, multiecho pulse sequences of the brain and surrounding structures were obtained without intravenous contrast. Angiographic images of the head were obtained using MRA technique without contrast. COMPARISON:  CT 04/21/2016.  MRI 12/04/2009. FINDINGS: MRI HEAD FINDINGS Diffusion imaging does not show any acute or subacute infarction. There are old small vessel infarctions affecting the pons. No cerebellar insult. Cerebral hemispheres show mild chronic small-vessel ischemic change of the deep white matter. No cortical or large vessel territory infarction. No mass lesion, hemorrhage, hydrocephalus or extra-axial collection. No pituitary mass. No inflammatory sinus disease. MRA HEAD FINDINGS Motion degraded exam. Both internal carotid arteries are patent into the brain. Siphon regions are not well evaluated because of motion. The anterior and middle cerebral vessels show flow. Both vertebral arteries are patent to the basilar. The basilar is patent. Major posterior circulation branch vessels are patent. IMPRESSION: MRI head: No acute finding. Old small vessel infarctions affecting the pons. Minimal small vessel change affecting the cerebral hemispheric white matter. MRA head: Severely motion degraded study. No major vessel occlusion. Electronically Signed   By: Nelson Chimes M.D.   On: 04/22/2016 10:26   US Carotid Bilateral  Result Date: 04/22/2016 CLINICAL DATA:  TIAs  EXAM: BILATERAL CAROTID DUPLEX ULTRASOUND TECHNIQUE: Pearline Cables scale imaging, color Doppler and duplex ultrasound were performed of bilateral carotid  and vertebral arteries in the neck. COMPARISON:  None. FINDINGS: Criteria: Quantification of carotid stenosis is based on velocity parameters that correlate the residual internal carotid diameter with NASCET-based stenosis levels, using the diameter of the distal internal carotid lumen as the denominator for stenosis measurement. The following velocity measurements were obtained: RIGHT ICA:  357/67 cm/sec CCA:  [id number] cm/sec SYSTOLIC ICA/CCA RATIO:  5.4 DIASTOLIC ICA/CCA RATIO:  9.4 ECA:  113 cm/sec LEFT ICA:  188/16 cm/sec CCA:  [id number] cm/sec SYSTOLIC ICA/CCA RATIO:  2.4 DIASTOLIC ICA/CCA RATIO:  2.3 ECA:  113 cm/sec RIGHT CAROTID ARTERY: Preliminary grayscale images demonstrate significant calcific plaque in the region of the carotid bulb and proximal internal carotid artery. The waveforms, velocities and flow velocity ratios indicate a high-grade stenosis. RIGHT VERTEBRAL ARTERY:  Antegrade in nature. LEFT CAROTID ARTERY: Preliminary grayscale images demonstrate atherosclerotic plaque in the carotid bulb and proximal internal carotid artery. The waveforms, velocities and flow velocity ratios show evidence of stenosis in the 50-69% range. LEFT VERTEBRAL ARTERY:  Antegrade in nature. IMPRESSION: Bilateral carotid stenosis much worse on the right than the left as described. Vascular surgery consultation is recommended Electronically Signed   By: Inez Catalina M.D.   On: 04/22/2016 11:22   Mr Jodene Nam Head/brain [id number] Cm  Result Date: 04/22/2016 CLINICAL DATA:  End-stage renal disease patient. Chronic encephalopathy. Nausea, vomiting and right-sided weakness. EXAM: MRI HEAD WITHOUT CONTRAST MRA HEAD WITHOUT CONTRAST TECHNIQUE: Multiplanar, multiecho pulse sequences of the brain and surrounding structures were obtained without intravenous contrast. Angiographic images of the head were obtained using MRA technique without contrast. COMPARISON:  CT 04/21/2016.  MRI 12/04/2009. FINDINGS: MRI HEAD FINDINGS Diffusion imaging does  not show any acute or subacute infarction. There are old small vessel infarctions affecting the pons. No cerebellar insult. Cerebral hemispheres show mild chronic small-vessel ischemic change of the deep white matter. No cortical or large vessel territory infarction. No mass lesion, hemorrhage, hydrocephalus or extra-axial collection. No pituitary mass. No inflammatory sinus disease. MRA HEAD FINDINGS Motion degraded exam. Both internal carotid arteries are patent into the brain. Siphon regions are not well evaluated because of motion. The anterior and middle cerebral vessels show flow. Both vertebral arteries are patent to the basilar. The basilar is patent. Major posterior circulation branch vessels are patent. IMPRESSION: MRI head: No acute finding. Old small vessel infarctions affecting the pons. Minimal small vessel change affecting the cerebral hemispheric white matter. MRA head: Severely motion degraded study. No major vessel occlusion. Electronically Signed   By: Nelson Chimes M.D.   On: 04/22/2016 10:26   Ct Head Code Stroke W/o Cm  Result Date: 04/21/2016 CLINICAL DATA:  Right-sided weakness. EXAM: CT HEAD WITHOUT CONTRAST TECHNIQUE: Contiguous axial images were obtained from the base of the skull through the vertex without intravenous contrast. COMPARISON:  04/08/2016 FINDINGS: Normal appearance of the brain. No evidence of acute infarct, hemorrhage, hydrocephalus, or mass lesion. No hyperdense vessel. Calcified atherosclerosis in the carotid and vertebral circulation. Negative skull and visualized orbits. Clear visualized paranasal sinuses. ASPECTS [facility] Stroke Program Early CT Score, [email or web address]) - Ganglionic level infarction (caudate, lentiform nuclei, internal capsule, insula, M1-M3 cortex): 7 - Supraganglionic infarction (M4-M6 cortex): 3 Total score (0-10): 10 These results were called by telephone at the time of interpretation on 04/21/2016 at 8:47 am to Dr. Delman Kitten , who  verbally acknowledged these results. IMPRESSION: 1. Negative  for hemorrhage or visible infarct. 2. ASPECTS score 10. Electronically Signed   By: Monte Fantasia M.D.   On: 04/21/2016 08:48   EKG:   Orders placed or performed during the hospital encounter of 04/21/16  . ED EKG  . ED EKG  . EKG 12-Lead  . EKG 12-Lead    ASSESSMENT AND PLAN:   Tichina Jobst is a 55 y.o. female presenting with Abdominal Pain and Emesis . Admitted 04/21/2016 : Day #: 1 day   #TIA/CVA Neurology input appreciated no further recommendations  #Acute cystitis with abnormal urinalysis Follow-up on urine culture and sensitivity. Patient will be continued on IV Rocephin while waiting for culture results  #Intractable nausea and vomiting with generalized abdominal pain Provide antiemetics  #End stage renal disease Continue hemodialysis per nephrology. Dialysis tomorrow  #Hallucination with history of depression Psychiatry consult is placed  All the records are reviewed and case discussed with Care Management/Social Workerr. Management plans discussed with the patient, family and they are in agreement.  CODE STATUS: full TOTAL TIME TAKING CARE OF THIS PATIENT: 28 minutes.   POSSIBLE D/C IN 1-2DAYS, DEPENDING ON CLINICAL CONDITION.   Jocelyn Lowery,  Karenann Cai.D on 04/22/2016 at 2:12 PM  Between 7am to 6pm - Pager - [phone]  After 6pm: House Pager: - [phone]  [facility]  Office  [phone]  CC: Primary care physician; Ellamae Sia, MD

## 2016-04-23 ENCOUNTER — Inpatient Hospital Stay (HOSPITAL_COMMUNITY): Payer: Medicare Other

## 2016-04-23 DIAGNOSIS — I4581 Long QT syndrome: Secondary | ICD-10-CM

## 2016-04-23 DIAGNOSIS — F331 Major depressive disorder, recurrent, moderate: Secondary | ICD-10-CM

## 2016-04-23 LAB — BLOOD GAS, ARTERIAL
Acid-Base Excess: 7.9 mmol/L — ABNORMAL HIGH (ref 0.0–3.0)
Allens test (pass/fail): POSITIVE — AB
Bicarbonate: 32 mEq/L — ABNORMAL HIGH (ref 21.0–28.0)
FIO2: 0.32
O2 Saturation: 89.8 %
Patient temperature: 37
pCO2 arterial: 42 mmHg (ref 32.0–48.0)
pH, Arterial: 7.49 — ABNORMAL HIGH (ref 7.350–7.450)
pO2, Arterial: 53 mmHg — ABNORMAL LOW (ref 83.0–108.0)

## 2016-04-23 LAB — GLUCOSE, CAPILLARY
Glucose-Capillary: 46 mg/dL — ABNORMAL LOW (ref 65–99)
Glucose-Capillary: 54 mg/dL — ABNORMAL LOW (ref 65–99)
Glucose-Capillary: 65 mg/dL (ref 65–99)
Glucose-Capillary: 93 mg/dL (ref 65–99)
Glucose-Capillary: 67 mg/dL (ref 65–99)
Glucose-Capillary: 195 mg/dL — ABNORMAL HIGH (ref 65–99)
Glucose-Capillary: 35 mg/dL — CL (ref 65–99)
Glucose-Capillary: 40 mg/dL — CL (ref 65–99)
Glucose-Capillary: 65 mg/dL (ref 65–99)
Glucose-Capillary: 65 mg/dL (ref 65–99)
Glucose-Capillary: 78 mg/dL (ref 65–99)
Glucose-Capillary: 63 mg/dL — ABNORMAL LOW (ref 65–99)
Glucose-Capillary: 193 mg/dL — ABNORMAL HIGH (ref 65–99)

## 2016-04-23 LAB — URINE CULTURE
Culture: >=100000 — AB
Special Requests: NORMAL

## 2016-04-23 LAB — AMMONIA: Ammonia: 39 umol/L — ABNORMAL HIGH (ref 9–35)

## 2016-04-23 IMAGING — CR DG ABDOMEN ACUTE W/ 1V CHEST
1 series · 4 of 4 positions shown · non-contrast
Comparison: Chest radiograph November 28, 2015; abdominal radiograph
November 17, 2015

CLINICAL DATA: Nausea and abdominal pain.  Chronic renal failure.

EXAM:
DG ABDOMEN ACUTE W/ 1V CHEST

[Series 1: dg abd acute w/chest · 0.14mm/px · 4 of 4 slices shown]
[im 1/4]
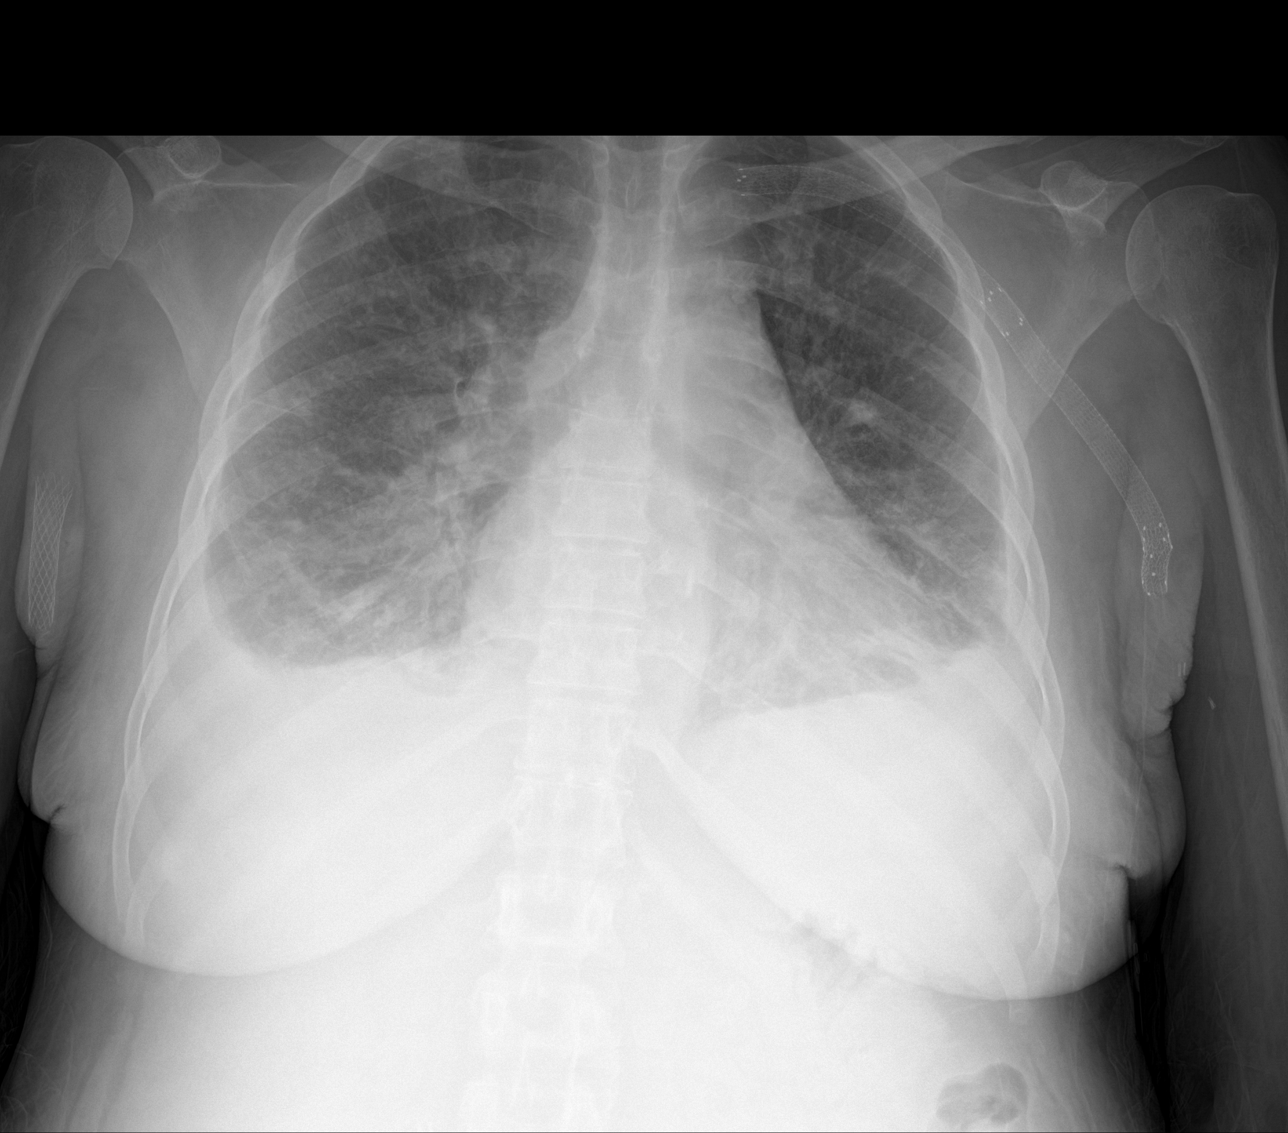
[im 2/4]
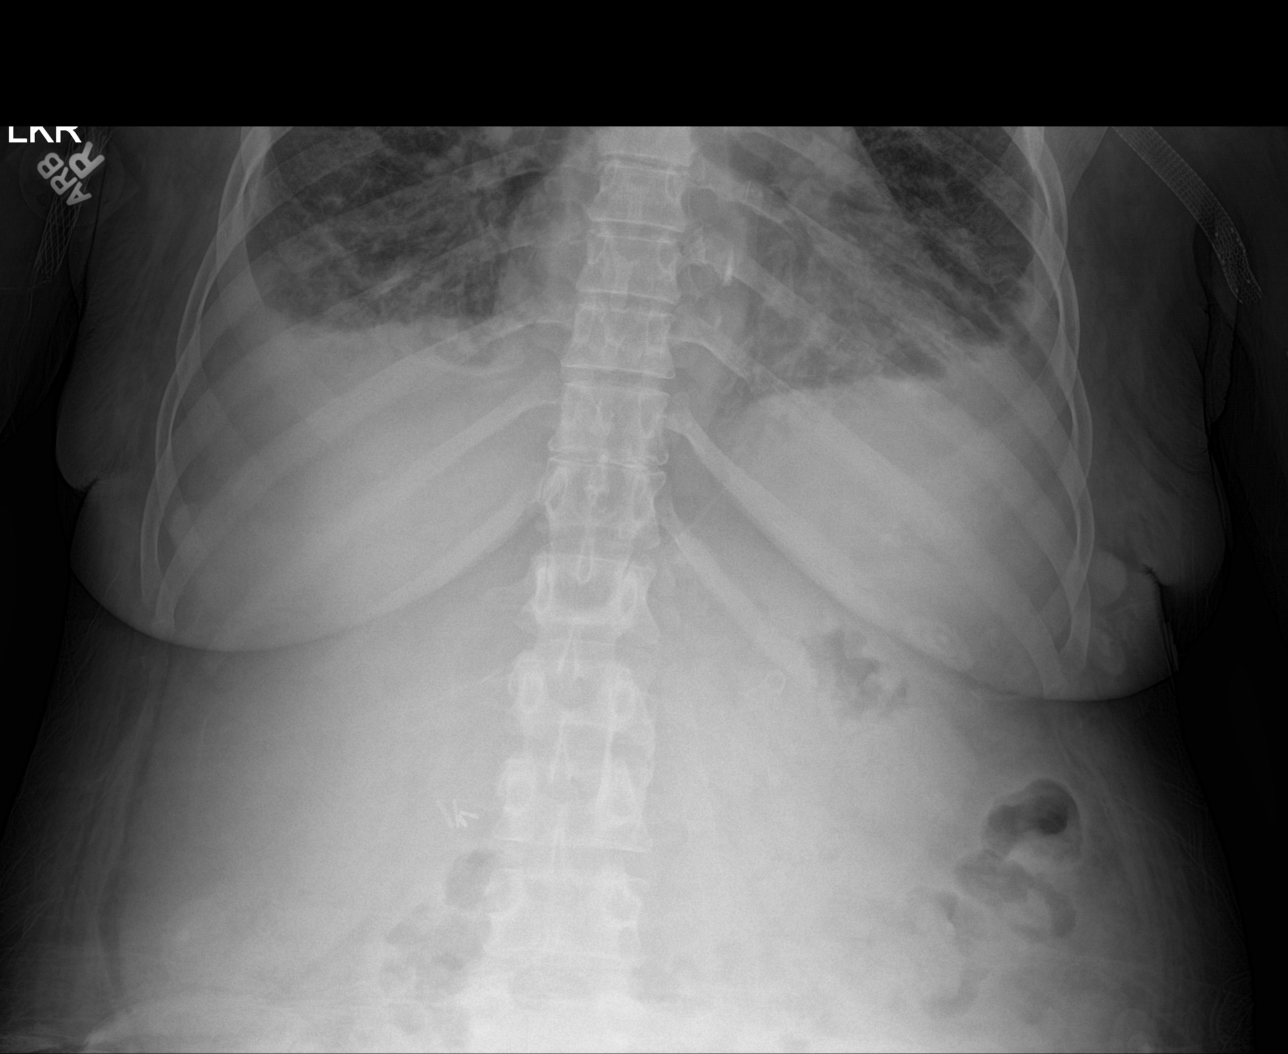
[im 3/4]
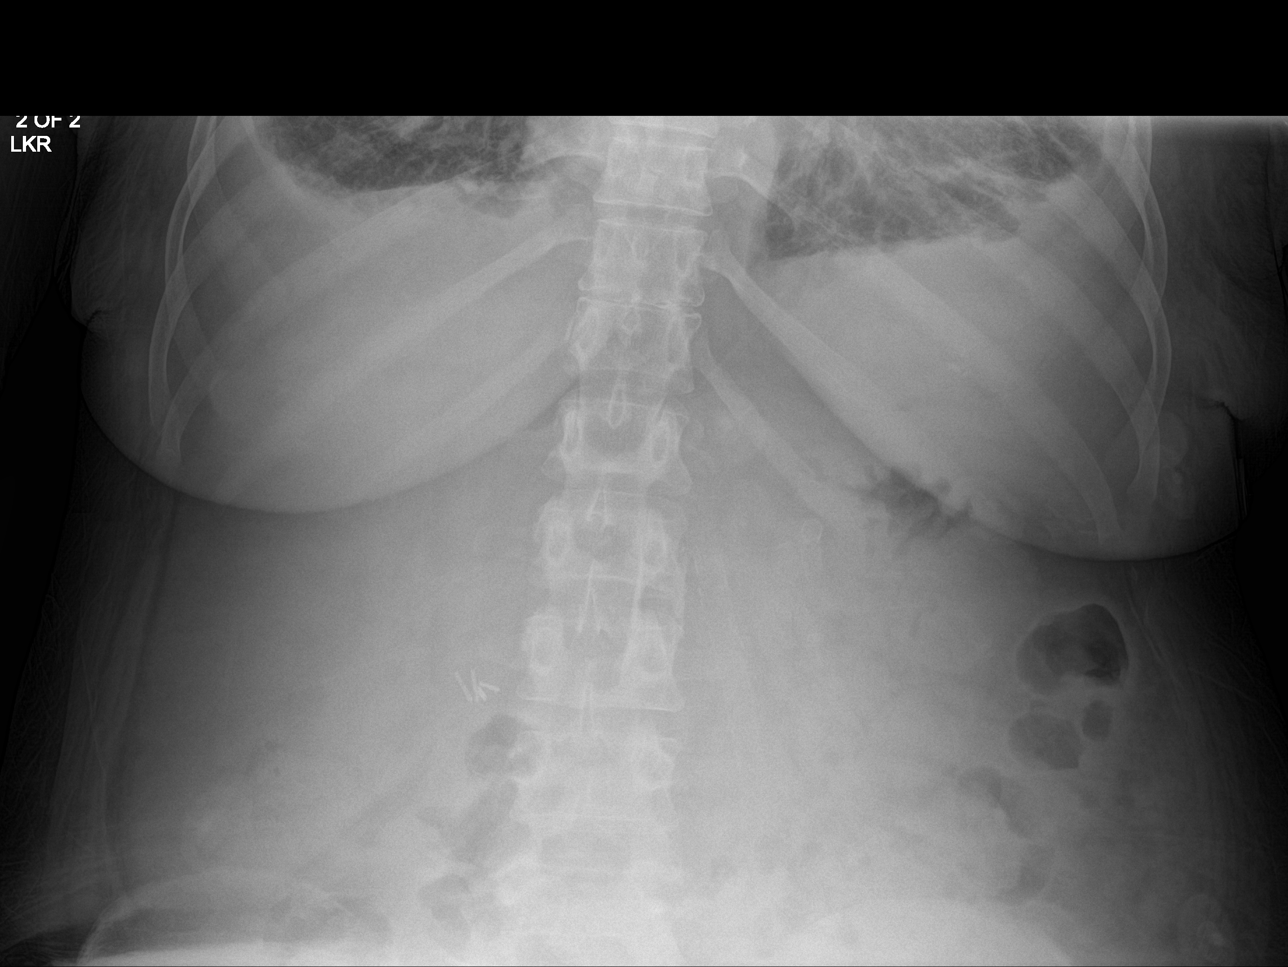
[im 4/4]
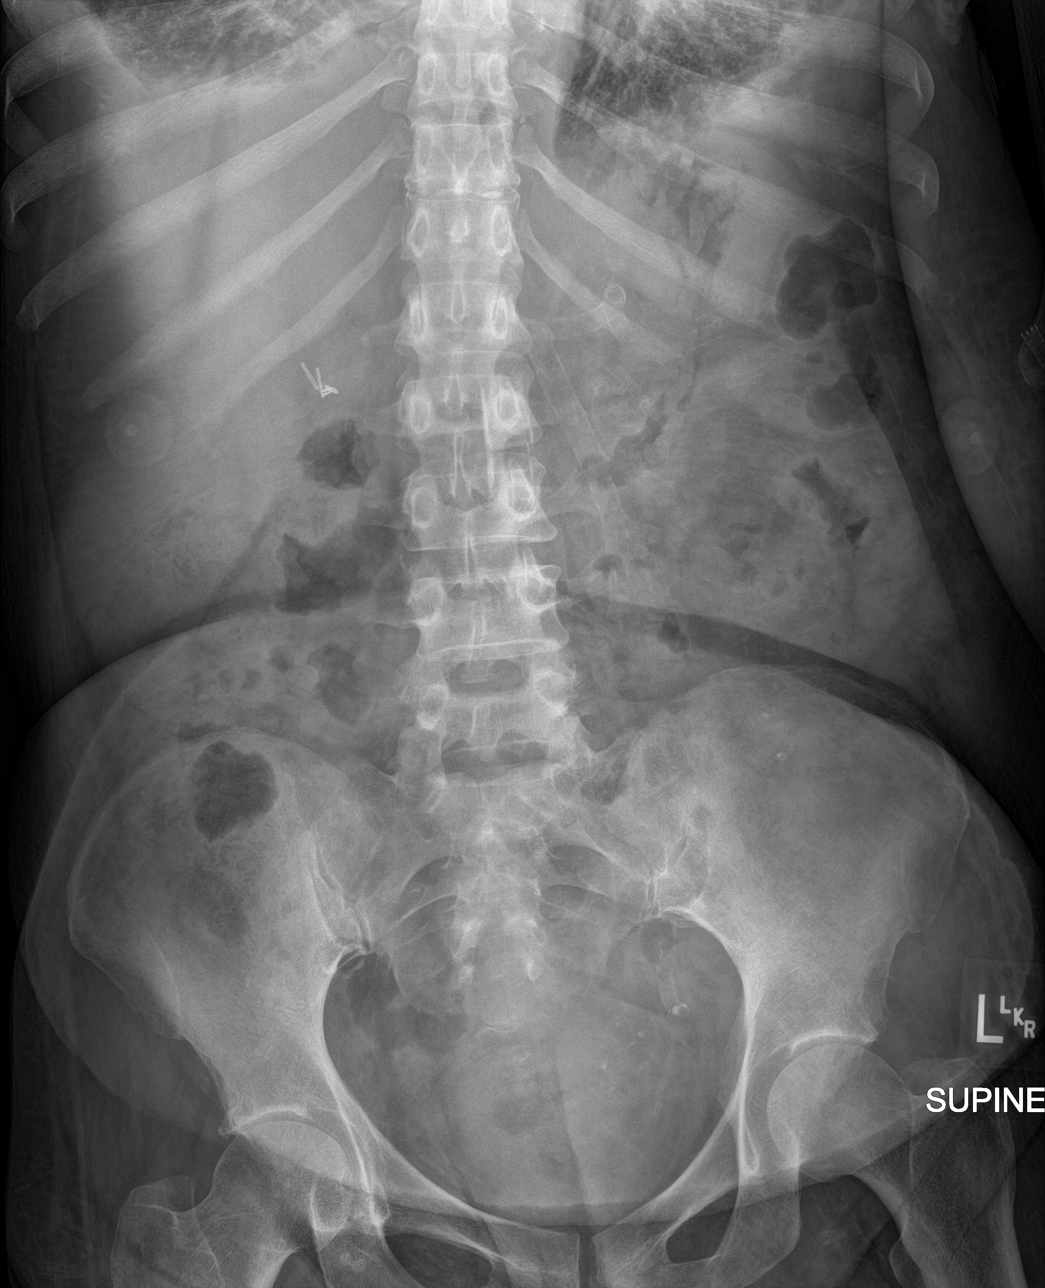

[4 of 4 positions shown; findings below may reference images not displayed]

FINDINGS: PA chest: There are bilateral pleural effusions with bibasilar
atelectasis. There is mild cardiomegaly with pulmonary venous
hypertension. No frank edema. There is a stent in the subclavian and
axillary regions on the left.

Supine and upright abdomen: There is diffuse stool throughout colon.
There is no bowel dilatation or air-fluid level suggesting
obstruction. No free air. There is extensive vascular calcification.
There are surgical clips in right upper quadrant region.
IMPRESSION: Diffuse stool throughout colon. No bowel obstruction or free air.
Evidence of a degree of congestive heart failure.

## 2016-04-23 MED ORDER — DEXTROSE 10 % IV SOLN
INTRAVENOUS | Status: DC
  Administered 2016-04-23 – 2016-04-26 (×4): via INTRAVENOUS

## 2016-04-23 MED ORDER — HALOPERIDOL LACTATE 5 MG/ML IJ SOLN
2.0000 mg | Freq: Once | INTRAMUSCULAR | Status: AC
  Administered 2016-04-23: 18:00:00 2 mg via INTRAVENOUS
  Filled 2016-04-23: qty 1

## 2016-04-23 MED ORDER — HALOPERIDOL LACTATE 5 MG/ML IJ SOLN: 3.0000 mg | Freq: Four times a day (QID) | INTRAMUSCULAR | Status: DC | PRN

## 2016-04-23 MED ORDER — DEXTROSE-NACL 5-0.45 % IV SOLN
INTRAVENOUS | Status: DC
  Administered 2016-04-23: 14:00:00 via INTRAVENOUS

## 2016-04-23 MED ORDER — IPRATROPIUM-ALBUTEROL 0.5-2.5 (3) MG/3ML IN SOLN: 3.0000 mL | Freq: Four times a day (QID) | RESPIRATORY_TRACT | Status: DC | PRN

## 2016-04-23 MED ORDER — PROMETHAZINE HCL 25 MG/ML IJ SOLN
12.5000 mg | Freq: Four times a day (QID) | INTRAMUSCULAR | Status: DC
  Administered 2016-04-23 – 2016-04-25 (×8): 12.5 mg via INTRAVENOUS
  Filled 2016-04-23 (×8): qty 1

## 2016-04-23 MED ORDER — HALOPERIDOL LACTATE 5 MG/ML IJ SOLN
2.0000 mg | INTRAMUSCULAR | Status: DC | PRN
  Administered 2016-04-23 – 2016-04-27 (×4): 2 mg via INTRAVENOUS
  Filled 2016-04-23 (×4): qty 1

## 2016-04-23 MED ORDER — HALOPERIDOL LACTATE 5 MG/ML IJ SOLN
3.0000 mg | Freq: Once | INTRAMUSCULAR | Status: AC
  Administered 2016-04-23: 22:00:00 3 mg via INTRAVENOUS
  Filled 2016-04-23: qty 1

## 2016-04-23 MED ORDER — DEXTROSE 50 % IV SOLN
INTRAVENOUS | Status: AC
  Administered 2016-04-23: 06:00:00 25 mL
  Filled 2016-04-23: qty 50

## 2016-04-23 MED ORDER — DEXTROSE 50 % IV SOLN
INTRAVENOUS | Status: AC
  Administered 2016-04-23: 50 mL
  Filled 2016-04-23: qty 50

## 2016-04-23 NOTE — Progress Notes (Signed)
Pre dialysis  

## 2016-04-23 NOTE — Progress Notes (Signed)
Post dialysis 

## 2016-04-23 NOTE — Progress Notes (Signed)
Pre Dialysis 

## 2016-04-23 NOTE — Consult Note (Signed)
[facility] Face-to-Face Psychiatry Consult   Reason for Consult:  Consult follow-up for 55 year old woman with a diagnosis of bipolar disorder but with chronic anxiety and depression symptoms and social difficulties along with multiple severe medical problems Referring Physician:  H[place] Patient Identification: [name] MRN:  [id number] Principal Diagnosis: Depression, major, recurrent, severe with psychosis ([name]) Diagnosis:   Patient Active Problem List   Diagnosis Date Noted  . TIA (transient ischemic attack) [G45.9] 04/21/2016  . Altered mental status [R41.82] 04/08/2016  . Hyperammonemia ([place]) [E72.20] 04/08/2016  . Elevated troponin [R79.89] 04/08/2016  . ESRD (end stage renal disease) ([name]) [N18.6] 04/08/2016  . Depression [F32.9] 04/08/2016  . Depression, major, recurrent, severe with psychosis ([name]) [F33.3] 04/08/2016  . Blood in stool [K92.1]   . Intractable cyclical vomiting with nausea [G43.A1]   . Reflux esophagitis [K21.0]   . Gastritis [K29.70]   . Generalized abdominal pain [R10.84]   . Uncontrollable vomiting [R11.10]   . Major depressive disorder, recurrent episode, moderate ([name]) [F33.1] 03/15/2016  . Adjustment disorder with mixed anxiety and depressed mood [F43.23] 03/15/2016  . Malnutrition of moderate degree [E44.0] 12/01/2015  . Renal mass [N28.89]   . Dyspnea [R06.00]   . Acute renal failure ([name]) [N17.9]   . Respiratory failure ([name]) [J96.90]   . High temperature [R50.9] 11/14/2015  . Pulmonary edema [J81.1]   . Encounter for central line placement [Z45.2]   . Encounter for orogastric (OG) tube placement [Z46.59]   . Nausea [R11.0] 11/12/2015  . Hyperkalemia [E87.5] 10/03/2015  . C. difficile diarrhea [A04.7] 07/22/2015  . Pneumonia [J18.9] 05/21/2015  . Hypoglycemia [E16.2] 04/24/2015  . Unresponsiveness [R40.4] 04/24/2015  . Bradycardia [R00.1] 04/24/2015  . Hypothermia [T68.XXXA] 04/24/2015  . Acute respiratory failure ([name]) [J96.00] 04/24/2015  .  Acute diastolic CHF (congestive heart failure) ([name]) [I50.31] 04/05/2015  . Diabetic gastroparesis ([name]) [E11.43, K31.84] 04/05/2015  . Hypokalemia [E87.6] 04/05/2015  . Generalized weakness [R53.1] 04/05/2015  . Acute pulmonary edema ([name]) [J81.0] 04/03/2015  . Nausea and vomiting [R11.2] 04/03/2015  . Hypoglycemia associated with diabetes ([name]) [E11.649] 04/03/2015  . Anemia of chronic disease [D63.8] 04/03/2015  . Secondary hyperparathyroidism ([name]) [N25.81] 04/03/2015  . Pressure ulcer [L89.90] 04/02/2015  . Acute respiratory failure with hypoxia ([name]) [J96.01] 04/01/2015  . Adjustment disorder with anxiety [F43.22] 03/14/2015  . Somatic symptom disorder, mild [F45.8] 03/08/2015  . Coronary artery disease involving native coronary artery of native heart without angina pectoris [I25.10]   . Nausea & vomiting [R11.2] 03/06/2015  . Abdominal pain [R10.9] 03/06/2015  . DM (diabetes mellitus) ([name]) [E11.9] 03/06/2015  . HTN (hypertension) [I10] 03/06/2015  . Gastroparesis [K31.84] 02/24/2015  . Pleural effusion [J90] 02/19/2015  . HCAP (healthcare-associated pneumonia) [J18.9] 02/19/2015  . End-stage renal disease on hemodialysis ([name]) [N18.6, Z99.2] 02/19/2015    Total Time spent with patient: 30 minutes  Subjective:   [name] is a 55 y.o. female patient admitted with "I slept better".   Hollow up for 55 year old woman with possible bipolar disorder. Today she appears to be delirious. Came to see the patient and she was very confused. Unable to have a conversation. Somewhat agitated. Nursing called me shortly thereafter the patient was getting more agitated and confused. HPI:  Follow-up on yesterday's evaluation. Patient says that she slept better last night. Got 8 or 9 hours of sleep. They tried to do dialysis today but she had clots in the line and will have to be reevaluated for dialysis tomorrow. Meanwhile she feels like her mood  is a little bit more relaxed. Not as anxious.  She had a talk with her son which she seems to characterize his having been positive last night. Still having some visual hallucinations but doesn't seem to be distressed by them. No other Robin complaints no indication of acute dangerousness.  Past Psychiatric History: Long-standing mood problems with a lot of exacerbation when she is medically sick. She had been doing very well when she last left the hospital and then seems to of decompensated pretty soon after going back home. Already seems to be returning to a better baseline no history of actual suicide attempts.  Risk to Self: Is patient at risk for suicide?: No Risk to Others:   Prior Inpatient Therapy:   Prior Outpatient Therapy:    Past Medical History:  Past Medical History:  Diagnosis Date  . Anemia   . Anxiety   . Arthritis   . Asthma   . chronic diastolic CHF [id number]  . Collagen vascular disease ([place])   . COPD (chronic obstructive pulmonary disease) ([place])   . Coronary artery disease    a. cath 2013: stenting to RCA (report not available); b. cath 2014: LM nl, pLAD 40%, mLAD nl, ost LCx 40%, mid LCx nl, pRCA 30% @ site of prior stent, mRCA 50%  . Depression   . Diabetes mellitus without complication ([place])   . Diabetic neuropathy ([place])   . dialysis 2006  . ESRD (end stage renal disease) on dialysis ([name])    M-W-F  . GERD (gastroesophageal reflux disease)   . Headache   . Hx of pancreatitis 2015  . Hypertension   . Mitral regurgitation    a. echo 10/2013: EF 62%, noWMA, mildly dilated LA, mild to mod MR/TR, GR1DD  . Myocardial infarction ([place])   . Pneumonia   . Renal insufficiency     Past Surgical History:  Procedure Laterality Date  . ABDOMINAL HYSTERECTOMY    . APPENDECTOMY    . CARDIAC CATHETERIZATION Left 07/26/2015   Procedure: Left Heart Cath and Coronary Angiography;  Surgeon: Dionisio David, MD;  Location: [facility];  Service: Cardiovascular;  Laterality: Left;  . CHOLECYSTECTOMY    . DIALYSIS  FISTULA CREATION Left    upper arm  . ESOPHAGOGASTRODUODENOSCOPY N/A 03/08/2015   Procedure: ESOPHAGOGASTRODUODENOSCOPY (EGD);  Surgeon: Manya Silvas, MD;  Location: [facility] ENDOSCOPY;  Service: Endoscopy;  Laterality: N/A;  . ESOPHAGOGASTRODUODENOSCOPY (EGD) WITH PROPOFOL N/A 03/18/2016   Procedure: ESOPHAGOGASTRODUODENOSCOPY (EGD) WITH PROPOFOL;  Surgeon: [name], MD;  Location: [facility] ENDOSCOPY;  Service: Endoscopy;  Laterality: N/A;  . EYE SURGERY    . FECAL TRANSPLANT N/A 08/23/2015   Procedure: FECAL TRANSPLANT;  Surgeon: Manya Silvas, MD;  Location: [facility] ENDOSCOPY;  Service: Endoscopy;  Laterality: N/A;  . PERIPHERAL VASCULAR CATHETERIZATION N/A 12/20/2015   Procedure: Thrombectomy of dialysis access versus permcath placement;  Surgeon: Algernon Huxley, MD;  Location: [place] [facility];  Service: Cardiovascular;  Laterality: N/A;  . PERIPHERAL VASCULAR CATHETERIZATION N/A 12/20/2015   Procedure: A/V Shunt Intervention;  Surgeon: Algernon Huxley, MD;  Location: [place] [facility];  Service: Cardiovascular;  Laterality: N/A;  . PERIPHERAL VASCULAR CATHETERIZATION N/A 12/20/2015   Procedure: A/V Shuntogram/Fistulagram;  Surgeon: Algernon Huxley, MD;  Location: [place] [facility];  Service: Cardiovascular;  Laterality: N/A;  . PERIPHERAL VASCULAR CATHETERIZATION N/A 01/02/2016   Procedure: A/V Shuntogram/Fistulagram;  Surgeon: Algernon Huxley, MD;  Location: [name] [place] [facility];  Service: Cardiovascular;  Laterality:  N/A;  . PERIPHERAL VASCULAR CATHETERIZATION N/A 01/02/2016   Procedure: A/V Shunt Intervention;  Surgeon: Algernon Huxley, MD;  Location: [place] [facility];  Service: Cardiovascular;  Laterality: N/A;   Family History:  Family History  Problem Relation Age of Onset  . Kidney disease Mother   . Diabetes Mother    Family Psychiatric  History: Positive for anxiety Social History:  History  Alcohol Use No     History  Drug Use No    Social History   Social History  . Marital  status: Divorced    Spouse name: N/A  . Number of children: N/A  . Years of education: N/A   Social History Main Topics  . Smoking status: Former Smoker    Packs/day: 0.50    Types: Cigarettes    Quit date: 02/13/2015  . Smokeless tobacco: Never Used  . Alcohol use No  . Drug use: No  . Sexual activity: Not Asked   Other Topics Concern  . None   Social History Narrative  . None   Additional Social History:    Allergies:   Allergies  Allergen Reactions  . Compazine [Prochlorperazine Edisylate] Anaphylaxis and Nausea And Vomiting    23 Jul - patient relates that she takes promethazine frequently with no problems.  . Ace Inhibitors Swelling  . Ativan [Lorazepam] Other (See Comments)    Reaction:  Hallucinations and headaches  . Codeine Nausea And Vomiting  . Dilaudid [Hydromorphone Hcl] Other (See Comments)    Delirium  . Gabapentin Other (See Comments)    Reaction:  Unknown   . Losartan Other (See Comments)    Reaction:  Unknown   . Ondansetron Other (See Comments)    Reaction:  Unknown   . Prochlorperazine Other (See Comments)    Reaction:  Unknown   . Reglan [Metoclopramide]     Per patient her Dr. Evelina Bucy her off it   . Scopolamine Other (See Comments)    Reaction:  Unknown   . Zofran [Ondansetron Hcl] Other (See Comments)    Reaction:  hallucinations   . Oxycodone Anxiety  . Tape Rash    Labs:  Results for orders placed or performed during the hospital encounter of 04/21/16 (from the past 48 hour(s))  Glucose, capillary     Status: Abnormal   Collection Time: 04/21/16  9:28 PM  Result Value Ref Range   Glucose-Capillary 50 (L) 65 - 99 mg/dL  Glucose, capillary     Status: Abnormal   Collection Time: 04/21/16  9:50 PM  Result Value Ref Range   Glucose-Capillary 62 (L) 65 - 99 mg/dL  Glucose, capillary     Status: Abnormal   Collection Time: 04/21/16 10:06 PM  Result Value Ref Range   Glucose-Capillary 64 (L) 65 - 99 mg/dL  Glucose, capillary     Status:  Abnormal   Collection Time: 04/21/16 10:47 PM  Result Value Ref Range   Glucose-Capillary 160 (H) 65 - 99 mg/dL  Lipid panel     Status: None   Collection Time: 04/22/16  4:42 AM  Result Value Ref Range   Cholesterol 106 0 - 200 mg/dL   Triglycerides 59 <150 mg/dL   HDL 72 >40 mg/dL   Total CHOL/HDL Ratio 1.5 RATIO   VLDL 12 0 - 40 mg/dL   LDL Cholesterol 22 0 - 99 mg/dL    Comment:        Total Cholesterol/HDL:CHD Risk Coronary Heart Disease Risk Table  Men   Women  1/2 Average Risk   3.4   3.3  Average Risk       5.0   4.4  2 X Average Risk   9.6   7.1  3 X Average Risk  23.4   11.0        Use the calculated Patient Ratio above and the CHD Risk Table to determine the patient's CHD Risk.        ATP III CLASSIFICATION (LDL):  <100     mg/dL   Optimal  100-129  mg/dL   Near or Above                    Optimal  130-159  mg/dL   Borderline  160-189  mg/dL   High  >190     mg/dL   Very High   Glucose, capillary     Status: Abnormal   Collection Time: 04/22/16  4:42 AM  Result Value Ref Range   Glucose-Capillary 50 (L) 65 - 99 mg/dL  Glucose, capillary     Status: Abnormal   Collection Time: 04/22/16  5:15 AM  Result Value Ref Range   Glucose-Capillary 52 (L) 65 - 99 mg/dL  Glucose, capillary     Status: Abnormal   Collection Time: 04/22/16  5:35 AM  Result Value Ref Range   Glucose-Capillary 57 (L) 65 - 99 mg/dL  Glucose, capillary     Status: Abnormal   Collection Time: 04/22/16  6:03 AM  Result Value Ref Range   Glucose-Capillary 162 (H) 65 - 99 mg/dL  Glucose, capillary     Status: Abnormal   Collection Time: 04/22/16  7:45 AM  Result Value Ref Range   Glucose-Capillary 107 (H) 65 - 99 mg/dL   Comment 1 Notify RN    Comment 2 Document in Chart   Glucose, capillary     Status: None   Collection Time: 04/22/16  1:07 PM  Result Value Ref Range   Glucose-Capillary 94 65 - 99 mg/dL   Comment 1 Notify RN    Comment 2 Document in Chart    Glucose, capillary     Status: None   Collection Time: 04/22/16  4:21 PM  Result Value Ref Range   Glucose-Capillary 69 65 - 99 mg/dL   Comment 1 Notify RN    Comment 2 Document in Chart   Glucose, capillary     Status: None   Collection Time: 04/22/16  6:15 PM  Result Value Ref Range   Glucose-Capillary 72 65 - 99 mg/dL   Comment 1 Notify RN    Comment 2 Document in Chart   Glucose, capillary     Status: None   Collection Time: 04/22/16 10:02 PM  Result Value Ref Range   Glucose-Capillary 68 65 - 99 mg/dL  Glucose, capillary     Status: None   Collection Time: 04/22/16 10:30 PM  Result Value Ref Range   Glucose-Capillary 65 65 - 99 mg/dL  Glucose, capillary     Status: None   Collection Time: 04/22/16 11:05 PM  Result Value Ref Range   Glucose-Capillary 70 65 - 99 mg/dL  Glucose, capillary     Status: Abnormal   Collection Time: 04/23/16  4:29 AM  Result Value Ref Range   Glucose-Capillary 46 (L) 65 - 99 mg/dL  Glucose, capillary     Status: Abnormal   Collection Time: 04/23/16  5:00 AM  Result Value Ref Range   Glucose-Capillary 54 (L) 65 -  99 mg/dL  Glucose, capillary     Status: None   Collection Time: 04/23/16  5:45 AM  Result Value Ref Range   Glucose-Capillary 65 65 - 99 mg/dL  Glucose, capillary     Status: None   Collection Time: 04/23/16  6:16 AM  Result Value Ref Range   Glucose-Capillary 93 65 - 99 mg/dL  Glucose, capillary     Status: None   Collection Time: 04/23/16  7:49 AM  Result Value Ref Range   Glucose-Capillary 67 65 - 99 mg/dL  Glucose, capillary     Status: Abnormal   Collection Time: 04/23/16  1:36 PM  Result Value Ref Range   Glucose-Capillary 40 (LL) 65 - 99 mg/dL  Glucose, capillary     Status: Abnormal   Collection Time: 04/23/16  1:54 PM  Result Value Ref Range   Glucose-Capillary 35 (LL) 65 - 99 mg/dL   Comment 1 Notify RN    Comment 2 Document in Chart   Glucose, capillary     Status: Abnormal   Collection Time: 04/23/16  2:05 PM   Result Value Ref Range   Glucose-Capillary 195 (H) 65 - 99 mg/dL  Ammonia     Status: Abnormal   Collection Time: 04/23/16  2:59 PM  Result Value Ref Range   Ammonia 39 (H) 9 - 35 umol/L  Blood gas, arterial     Status: Abnormal   Collection Time: 04/23/16  3:05 PM  Result Value Ref Range   FIO2 0.32    Delivery systems NASAL CANNULA    pH, Arterial 7.49 (H) 7.350 - 7.450   pCO2 arterial 42 32.0 - 48.0 mmHg   pO2, Arterial 53 (L) 83.0 - 108.0 mmHg   Bicarbonate 32.0 (H) 21.0 - 28.0 mEq/L   Acid-Base Excess 7.9 (H) 0.0 - 3.0 mmol/L   O2 Saturation 89.8 %   Patient temperature 37.0    Collection site RIGHT RADIAL    Sample type ARTERIAL DRAW    Allens test ([name]/fail) POSITIVE (A) [name]  Glucose, capillary     Status: None   Collection Time: 04/23/16  4:50 PM  Result Value Ref Range   Glucose-Capillary 65 65 - 99 mg/dL    Current Facility-Administered Medications  Medication Dose Route Frequency Provider Last Rate Last Dose  . acidophilus (RISAQUAD) capsule 1 capsule  1 capsule Oral Daily Nicholes Mango, MD   1 capsule at 04/21/16 1736  . albuterol (PROVENTIL) (2.5 MG/3ML) 0.083% nebulizer solution 2.5 mg  2.5 mg Inhalation Q4H PRN Nicholes Mango, MD   2.5 mg at 04/23/16 1720  . amLODipine (NORVASC) tablet 10 mg  10 mg Oral Daily Nicholes Mango, MD   10 mg at 04/22/16 1413  . aspirin EC tablet 81 mg  81 mg Oral Prudence Davidson, MD   81 mg at 04/22/16 0848  . atorvastatin (LIPITOR) tablet 20 mg  20 mg Oral QPM Nicholes Mango, MD   20 mg at 04/23/16 1701  . butalbital-acetaminophen-caffeine (FIORICET, ESGIC) 50-325-40 MG per tablet 1-2 tablet  1-2 tablet Oral Q6H PRN Harrie Foreman, MD   2 tablet at 04/23/16 0347  . cefTRIAXone (ROCEPHIN) 1 g in dextrose 5 % 50 mL IVPB  1 g Intravenous Once Delman Kitten, MD   Stopped at 04/21/16 1515  . cefTRIAXone (ROCEPHIN) 1 g in dextrose 5 % 50 mL IVPB  1 g Intravenous Q24H Nicholes Mango, MD   1 g at 04/23/16 1659  . clopidogrel (PLAVIX) tablet 75 mg  75  mg Oral Daily Nicholes Mango, MD   75 mg at 04/23/16 1406  . dextrose 5 %-0.45 % sodium chloride infusion   Intravenous Continuous Dustin Flock, MD 50 mL/hr at 04/23/16 1413    . FLUoxetine (PROZAC) capsule 20 mg  20 mg Oral Daily Nicholes Mango, MD   20 mg at 04/22/16 1357  . haloperidol lactate (HALDOL) injection 2 mg  2 mg Intravenous Q4H PRN Gonzella Lex, MD      . heparin injection 5,000 Units  5,000 Units Subcutaneous Q12H Nicholes Mango, MD   5,000 Units at 04/22/16 2158  . hydrALAZINE (APRESOLINE) tablet 25 mg  25 mg Oral TID Nicholes Mango, MD   25 mg at 04/23/16 1701  . HYDROcodone-acetaminophen (NORCO/VICODIN) 5-325 MG per tablet 1 tablet  1 tablet Oral Q6H PRN Nicholes Mango, MD   1 tablet at 04/23/16 0056  . ipratropium-albuterol (DUONEB) 0.5-2.5 (3) MG/3ML nebulizer solution 3 mL  3 mL Nebulization Q6H PRN Dustin Flock, MD      . lipase/protease/amylase (CREON) capsule 24,000 Units  24,000 Units Oral Q lunch Nicholes Mango, MD   24,000 Units at 04/22/16 1356  . lipase/protease/amylase (CREON) capsule 36,000 Units  36,000 Units Oral BID Nicholes Mango, MD   36,000 Units at 04/23/16 1701  . loratadine (CLARITIN) tablet 10 mg  10 mg Oral Daily Nicholes Mango, MD   10 mg at 04/21/16 1736  . morphine 2 MG/ML injection 2 mg  2 mg Intravenous Q4H PRN Lytle Butte, MD   2 mg at 04/23/16 0316  . OLANZapine (ZYPREXA) tablet 5 mg  5 mg Oral QHS Gonzella Lex, MD   5 mg at 04/22/16 2159  . pantoprazole (PROTONIX) EC tablet 40 mg  40 mg Oral BID AC Nicholes Mango, MD   40 mg at 04/23/16 1701  . pregabalin (LYRICA) capsule 25 mg  25 mg Oral Daily Nicholes Mango, MD   25 mg at 04/22/16 1357  . promethazine (PHENERGAN) injection 12.5 mg  12.5 mg Intravenous Q6H Dustin Flock, MD   12.5 mg at 04/23/16 1407  . ranolazine (RANEXA) 12 hr tablet 1,000 mg  1,000 mg Oral BID Nicholes Mango, MD   1,000 mg at 04/22/16 2159  . rifaximin (XIFAXAN) tablet 550 mg  550 mg Oral BID Nicholes Mango, MD   550 mg at 04/22/16 2159  . sevelamer  carbonate (RENVELA) tablet 800 mg  800 mg Oral TID WC Nicholes Mango, MD   800 mg at 04/23/16 1701    Musculoskeletal: Strength & Muscle Tone: decreased Gait & Station: unable to stand Patient leans: N/A  Psychiatric Specialty Exam: Physical Exam  Constitutional: She appears well-developed and well-nourished.  HENT:  Head: Normocephalic and atraumatic.  Eyes: Conjunctivae are normal. Pupils are equal, round, and reactive to light.  Neck: Normal range of motion.  Cardiovascular: Normal heart sounds.   Respiratory: Effort normal.  GI: Soft.  Musculoskeletal: Normal range of motion.  Neurological: She is alert.  Skin: Skin is warm and dry.  Psychiatric: Judgment normal. Her mood appears anxious. Her affect is labile and inappropriate. Her speech is delayed and tangential. She is agitated and slowed. Thought content is delusional. Thought content is not paranoid. Cognition and memory are normal. She expresses no suicidal ideation.    Review of Systems  Constitutional: Positive for malaise/fatigue.  HENT: Negative.   Eyes: Negative.   Respiratory: Negative.   Cardiovascular: Negative.   Gastrointestinal: Negative.   Musculoskeletal: Negative.   Skin: Negative.   Neurological:  Negative.   Psychiatric/Behavioral: Positive for hallucinations. Negative for depression, memory loss, substance abuse and suicidal ideas. The patient is nervous/anxious. The patient does not have insomnia.     Blood pressure (!) 146/64, pulse 81, temperature 98.9 F (37.2 C), temperature source Oral, resp. rate 18, height 5' 5.5" (1.664 m), weight 72 kg (158 lb 11.7 oz), SpO2 100 %.Body mass index is 26.01 kg/m.  General Appearance: Casual  Eye Contact:  Fair  Speech:  Slow  Volume:  Decreased  Mood:  Euthymic  Affect:  Constricted  Thought Process:  Goal Directed  Orientation:  Full (Time, Place, and Person)  Thought Content:  Logical  Suicidal Thoughts:  No  Homicidal Thoughts:  No  Memory:   Immediate;   Good Recent;   Fair Remote;   Fair  Judgement:  Fair  Insight:  Fair  Psychomotor Activity:  Decreased  Concentration:  Concentration: Fair  Recall:  [employer] of Knowledge:  Fair  Language:  Fair  Akathisia:  No  Handed:  Right  AIMS (if indicated):     Assets:  Communication Skills Desire for Improvement Financial Resources/Insurance Housing Resilience  ADL's:  Intact  Cognition:  WNL  Sleep:        Treatment Plan Summary: Medication management and Plan Mood had seemed improved yesterday. More delirious today. Likely from medical causes. Gave orders for IV Haldol for her confusion and delirium. I will continue to follow-up regularly. No change to other psychiatric medicine  Disposition: Patient does not meet criteria for psychiatric inpatient admission. Supportive therapy provided about ongoing stressors.  Alethia Berthold, MD 04/23/2016 7:05 PM

## 2016-04-23 NOTE — Progress Notes (Signed)
Speech Language Pathology Treatment: Dysphagia  Patient Details Name: [name] MRN: [id number] DOB: November 19, 1960 Today's Date: 04/23/2016 Time: 0752-0830 SLP Time Calculation (min) (ACUTE ONLY): 38 min  Assessment / Plan / Recommendation Clinical Impression  Pt appears to be tolerating her current dysphagia 3(mech soft) diet w/ thin liquids w/out oropharyngeal dysphagia reported by NSG/pt. Pt continues to have episodes of gagging/dry heaving w/ N/V; NSG addressing w/ medication including a PPI. Pt is only taking bites and sips of po's d/t the GI symptoms. Recommended NSG f/u w/ MD for further management as any episodes of regurgitation increase risk for aspiration and pulmonary decline. NSG agreed. ST services will be available for any further education and assessment re: diet consistency and swallowing as needed while admitted. Recommend continue current oral diet consistency w/ general aspiration and Reflux precautions; Dietician f/u for nutritional support d/t reduced oral intake at this time.   HPI HPI: Pt is a 55 y.o. female with multiple medical issues including a known history of HOH(R ear), End-stage renal disease on hemodialysis, chronic encephalopathy, chronic diastolic congestive heart failure, anxiety, COPD, diabetes mellitus HTN, GERD, and anxiety/depression. She was seen by dialysis center today and was sent to the hospital as patient was nauseous and vomiting also reporting right-sided weakness. During my examination patient was nauseous and reporting body aches t. Urinalysis is abnormal. Patient is a poor historian in view of encephalopathy. This morning she stated she has been "swallowing ok" but could not elaborate. She does endorse GERD symptoms and continues w/ episodes of gagging/dry heaving; N/V. NSG is giving medications for nausea and reflux. Pt is pending dialysis.      SLP Plan  Continue with current plan of care     Recommendations  Diet recommendations: Dysphagia 3  (mechanical soft);Thin liquid Liquids provided via: Cup;Straw Medication Administration: Whole meds with puree (for easier swallowing) Supervision: Patient able to self feed;Intermittent supervision to cue for compensatory strategies (setup assist) Compensations: Minimize environmental distractions;Slow rate;Small sips/bites;Follow solids with liquid;Lingual sweep for clearance of pocketing Postural Changes and/or Swallow Maneuvers: Seated upright 90 degrees;Upright 30-60 min after meal             General recommendations:  (Dietician consult) Oral Care Recommendations: Oral care BID;Staff/trained caregiver to provide oral care Follow up Recommendations: None Plan: Continue with current plan of care     [place], [place], CCC-SLP  Alper Guilmette 04/23/2016, 2:08 PM

## 2016-04-23 NOTE — Progress Notes (Signed)
[facility]  ROUNDING NOTE   Subjective:   Confused.  Seen and examined on hemodialysis. Tolerating treatment well. UF goal of 0.5lire    HEMODIALYSIS FLOWSHEET:  Blood Flow Rate (mL/min): 400 mL/min Arterial Pressure (mmHg): -150 mmHg Venous Pressure (mmHg): 170 mmHg Transmembrane Pressure (mmHg): 50 mmHg Ultrafiltration Rate (mL/min): 330 mL/min Dialysate Flow Rate (mL/min): 600 ml/min Conductivity: Machine : 14.1 Conductivity: Machine : 14.1 Dialysis Fluid Bolus: Normal Saline Bolus Amount (mL): 250 mL Dialysate Change:  (3K) Intra-Hemodialysis Comments: 649. Resting    Objective:  Vital signs in last 24 hours:  Temp:  [97.5 F (36.4 C)-98.4 F (36.9 C)] 98.4 F (36.9 C) (08/02 0915) Pulse Rate:  [59-76] 76 (08/02 1130) Resp:  [16-26] 20 (08/02 1130) BP: (125-153)/(47-72) 141/57 (08/02 1130) SpO2:  [91 %-100 %] 91 % (08/02 1130) Weight:  [72.6 kg (160 lb 0.9 oz)] 72.6 kg (160 lb 0.9 oz) (08/02 0915)  Weight change: 1.807 kg (3 lb 15.7 oz) Filed Weights   04/21/16 1603 04/22/16 1110 04/23/16 0915  Weight: 69.9 kg (154 lb) 70.3 kg (154 lb 15.7 oz) 72.6 kg (160 lb 0.9 oz)    Intake/Output: I/O last 3 completed shifts: In: -  Out: 250 [Emesis/NG output:250]   Intake/Output this shift:  Total I/O In: 480 [P.O.:480] Out: -   Physical Exam: General: NAD, laying in bed  Head: Normocephalic, atraumatic. Moist oral mucosal membranes  Eyes: Anicteric, PERRL  Neck: Supple, trachea midline  Lungs:  Clear to auscultation  Heart: Regular rate and rhythm  Abdomen:  Soft, nontender  Extremities: no peripheral edema.  Neurologic: Nonfocal, moving all four extremities  Skin: No lesions  Access: Left AVF     Basic Metabolic Panel:  Recent Labs Lab 04/21/16 0843 04/21/16 1731  NA 132*  --   K 4.4  --   CL 92*  --   CO2 28  --   GLUCOSE 85  --   BUN 21*  --   CREATININE 4.97* 5.31*  CALCIUM 9.0  --     Liver Function Tests:  Recent  Labs Lab 04/21/16 0843  AST 22  ALT 11*  ALKPHOS 102  BILITOT 0.6  PROT 8.4*  ALBUMIN 3.4*    Recent Labs Lab 04/21/16 0843  LIPASE <10*    Recent Labs Lab 04/21/16 0843  AMMONIA 27    CBC:  Recent Labs Lab 04/21/16 0843 04/21/16 1731  WBC 7.1 6.9  NEUTROABS 4.3  --   HGB 9.3* 9.3*  HCT 28.3* 28.0*  MCV 74.2* 73.5*  PLT 257 254    Cardiac Enzymes: No results for input(s): CKTOTAL, CKMB, CKMBINDEX, TROPONINI in the last 168 hours.  BNP: Invalid input(s): POCBNP  CBG:  Recent Labs Lab 04/23/16 0429 04/23/16 0500 04/23/16 0545 04/23/16 0616 04/23/16 0749  GLUCAP 46* 54* 65 93 67    Microbiology: Results for orders placed or performed during the hospital encounter of 04/21/16  Urine culture     Status: Abnormal   Collection Time: 04/21/16  9:19 AM  Result Value Ref Range Status   Specimen Description URINE, CLEAN CATCH  Final   Special Requests Normal  Final   Culture >=100,000 COLONIES/mL PROTEUS MIRABILIS (A)  Final   Report Status 04/23/2016 FINAL  Final   Organism ID, Bacteria PROTEUS MIRABILIS (A)  Final      Susceptibility   Proteus mirabilis - MIC*    AMPICILLIN <=2 SENSITIVE Sensitive     CEFAZOLIN 8 SENSITIVE Sensitive  CEFTRIAXONE <=1 SENSITIVE Sensitive     CIPROFLOXACIN <=0.25 SENSITIVE Sensitive     GENTAMICIN <=1 SENSITIVE Sensitive     IMIPENEM 4 SENSITIVE Sensitive     NITROFURANTOIN 256 RESISTANT Resistant     TRIMETH/SULFA <=20 SENSITIVE Sensitive     AMPICILLIN/SULBACTAM <=2 SENSITIVE Sensitive     PIP/TAZO <=4 SENSITIVE Sensitive     * >=100,000 COLONIES/mL PROTEUS MIRABILIS  MRSA PCR Screening     Status: None   Collection Time: 04/21/16  6:07 PM  Result Value Ref Range Status   MRSA by PCR NEGATIVE NEGATIVE Final    Comment:        The GeneXpert MRSA Assay (FDA approved for NASAL specimens only), is one component of a comprehensive MRSA colonization surveillance program. It is not intended to diagnose  MRSA infection nor to guide or monitor treatment for MRSA infections.     Coagulation Studies:  Recent Labs  04/21/16 0843  LABPROT 14.0  INR 1.08    Urinalysis:  Recent Labs  04/21/16 0919  COLORURINE YELLOW*  LABSPEC 1.010  PHURINE 8.0  GLUCOSEU NEGATIVE  HGBUR 2+*  BILIRUBINUR NEGATIVE  KETONESUR NEGATIVE  PROTEINUR 100*  NITRITE NEGATIVE  LEUKOCYTESUR 3+*      Imaging: Mr Brain Wo Contrast  Result Date: 04/22/2016 CLINICAL DATA:  End-stage renal disease patient. Chronic encephalopathy. Nausea, vomiting and right-sided weakness. EXAM: MRI HEAD WITHOUT CONTRAST MRA HEAD WITHOUT CONTRAST TECHNIQUE: Multiplanar, multiecho pulse sequences of the brain and surrounding structures were obtained without intravenous contrast. Angiographic images of the head were obtained using MRA technique without contrast. COMPARISON:  CT 04/21/2016.  MRI 12/04/2009. FINDINGS: MRI HEAD FINDINGS Diffusion imaging does not show any acute or subacute infarction. There are old small vessel infarctions affecting the pons. No cerebellar insult. Cerebral hemispheres show mild chronic small-vessel ischemic change of the deep white matter. No cortical or large vessel territory infarction. No mass lesion, hemorrhage, hydrocephalus or extra-axial collection. No pituitary mass. No inflammatory sinus disease. MRA HEAD FINDINGS Motion degraded exam. Both internal carotid arteries are patent into the brain. Siphon regions are not well evaluated because of motion. The anterior and middle cerebral vessels show flow. Both vertebral arteries are patent to the basilar. The basilar is patent. Major posterior circulation branch vessels are patent. IMPRESSION: MRI head: No acute finding. Old small vessel infarctions affecting the pons. Minimal small vessel change affecting the cerebral hemispheric white matter. MRA head: Severely motion degraded study. No major vessel occlusion. Electronically Signed   By: Nelson Chimes M.D.    On: 04/22/2016 10:26   US Carotid Bilateral  Result Date: 04/22/2016 CLINICAL DATA:  TIAs EXAM: BILATERAL CAROTID DUPLEX ULTRASOUND TECHNIQUE: Pearline Cables scale imaging, color Doppler and duplex ultrasound were performed of bilateral carotid and vertebral arteries in the neck. COMPARISON:  None. FINDINGS: Criteria: Quantification of carotid stenosis is based on velocity parameters that correlate the residual internal carotid diameter with NASCET-based stenosis levels, using the diameter of the distal internal carotid lumen as the denominator for stenosis measurement. The following velocity measurements were obtained: RIGHT ICA:  357/67 cm/sec CCA:  A999333 cm/sec SYSTOLIC ICA/CCA RATIO:  5.4 DIASTOLIC ICA/CCA RATIO:  9.4 ECA:  113 cm/sec LEFT ICA:  188/16 cm/sec CCA:  123456 cm/sec SYSTOLIC ICA/CCA RATIO:  2.4 DIASTOLIC ICA/CCA RATIO:  2.3 ECA:  113 cm/sec RIGHT CAROTID ARTERY: Preliminary grayscale images demonstrate significant calcific plaque in the region of the carotid bulb and proximal internal carotid artery. The waveforms, velocities and flow velocity ratios indicate  a high-grade stenosis. RIGHT VERTEBRAL ARTERY:  Antegrade in nature. LEFT CAROTID ARTERY: Preliminary grayscale images demonstrate atherosclerotic plaque in the carotid bulb and proximal internal carotid artery. The waveforms, velocities and flow velocity ratios show evidence of stenosis in the 50-69% range. LEFT VERTEBRAL ARTERY:  Antegrade in nature. IMPRESSION: Bilateral carotid stenosis much worse on the right than the left as described. Vascular surgery consultation is recommended Electronically Signed   By: Inez Catalina M.D.   On: 04/22/2016 11:22   Mr Jodene Nam Head/brain [id number] Cm  Result Date: 04/22/2016 CLINICAL DATA:  End-stage renal disease patient. Chronic encephalopathy. Nausea, vomiting and right-sided weakness. EXAM: MRI HEAD WITHOUT CONTRAST MRA HEAD WITHOUT CONTRAST TECHNIQUE: Multiplanar, multiecho pulse sequences of the brain and  surrounding structures were obtained without intravenous contrast. Angiographic images of the head were obtained using MRA technique without contrast. COMPARISON:  CT 04/21/2016.  MRI 12/04/2009. FINDINGS: MRI HEAD FINDINGS Diffusion imaging does not show any acute or subacute infarction. There are old small vessel infarctions affecting the pons. No cerebellar insult. Cerebral hemispheres show mild chronic small-vessel ischemic change of the deep white matter. No cortical or large vessel territory infarction. No mass lesion, hemorrhage, hydrocephalus or extra-axial collection. No pituitary mass. No inflammatory sinus disease. MRA HEAD FINDINGS Motion degraded exam. Both internal carotid arteries are patent into the brain. Siphon regions are not well evaluated because of motion. The anterior and middle cerebral vessels show flow. Both vertebral arteries are patent to the basilar. The basilar is patent. Major posterior circulation branch vessels are patent. IMPRESSION: MRI head: No acute finding. Old small vessel infarctions affecting the pons. Minimal small vessel change affecting the cerebral hemispheric white matter. MRA head: Severely motion degraded study. No major vessel occlusion. Electronically Signed   By: Nelson Chimes M.D.   On: 04/22/2016 10:26     Medications:     . acidophilus  1 capsule Oral Daily  . amLODipine  10 mg Oral Daily  . aspirin EC  81 mg Oral BH-q7a  . atorvastatin  20 mg Oral QPM  . cefTRIAXone (ROCEPHIN)  IV  1 g Intravenous Once  . cefTRIAXone (ROCEPHIN)  IV  1 g Intravenous Q24H  . clopidogrel  75 mg Oral Daily  . FLUoxetine  20 mg Oral Daily  . heparin subcutaneous  5,000 Units Subcutaneous Q12H  . hydrALAZINE  25 mg Oral TID  . lipase/protease/amylase  24,000 Units Oral Q lunch  . lipase/protease/amylase  36,000 Units Oral BID  . loratadine  10 mg Oral Daily  . OLANZapine  5 mg Oral QHS  . pantoprazole  40 mg Oral BID AC  . pregabalin  25 mg Oral Daily  .  promethazine  12.5 mg Intravenous Q6H  . ranolazine  1,000 mg Oral BID  . rifaximin  550 mg Oral BID  . sevelamer carbonate  800 mg Oral TID WC   albuterol, butalbital-acetaminophen-caffeine, HYDROcodone-acetaminophen, morphine injection  Assessment/ Plan:  Ms. AAFIYA CROUT is a 55 y.o. black female  with insulin dependent diabetes mellitus type II, hypertension, hyperlipidemia, diabetic retinopathy, diabetic neuropathy, diabetic gastroparesis, pancreatic insufficiency, depression, GERD. Renal masses - avoiding epo   MWF [facility] [address]   1. End Stage Renal Disease: seen and examined on hemodialysis. Tolerating treatment well. UF minimal.  - Continue MWF schedule  2. Anemia of chronic kidney disease: hemoglobin 9.3. Not getting erythropoeitin stimulating agent as outpatient due to renal masses. However CT from 6/24 does not seem to show any renal masses.  -  will discuss with hematology.   3. Secondary Hyperparathyroidism:  - Continue sevelamer  4. Hypertension: at goal.   - amlodipine and hydralazine.   5. Encephalopathy: appreciate psych input. CT head on 7/18 negative.  - olanzapine   LOS: [place], Juliene Kirsh 8/2/201712:04 PM

## 2016-04-23 NOTE — Progress Notes (Addendum)
Pt's sugar level was 46 this AM. Given apple juice x2 per patient request, came up to 65. Peanut butter given, but patient threw it up. 25 ml of D50 IV given and improved to 95. Continue to assess.

## 2016-04-23 NOTE — Progress Notes (Signed)
[facility] [place] NAME: [name]    MRN#:  [id number]  DATE OF BIRTH:  03-23-1961  SUBJECTIVE:  Hospital Day: 2 days [name] is a 55 y.o. female presenting with Abdominal Pain and Emesis .   Overnight events: No Overnight events Interval Events: Patient continues to be confused and not acting herself not a bit and eating much  REVIEW OF SYSTEMS:  CONSTITUTIONAL: No fever, fatigue or weakness.  EYES: No blurred or double vision.  EARS, NOSE, AND THROAT: No tinnitus or ear pain.  RESPIRATORY: No cough, shortness of breath, wheezing or hemoptysis.  CARDIOVASCULAR: No chest pain, orthopnea, edema.  GASTROINTESTINAL: Positive nausea, vomiting, denies diarrhea or abdominal pain.  GENITOURINARY: No dysuria, hematuria.  ENDOCRINE: No polyuria, nocturia,  HEMATOLOGY: No anemia, easy bruising or bleeding SKIN: No rash or lesion. MUSCULOSKELETAL: No joint pain or arthritis.   NEUROLOGIC: No tingling, numbness, weakness.  PSYCHIATRY: No anxiety or depression.   DRUG ALLERGIES:   Allergies  Allergen Reactions  . Compazine [Prochlorperazine Edisylate] Anaphylaxis and Nausea And Vomiting    23 Jul - patient relates that she takes promethazine frequently with no problems.  . Ace Inhibitors Swelling  . Ativan [Lorazepam] Other (See Comments)    Reaction:  Hallucinations and headaches  . Codeine Nausea And Vomiting  . Dilaudid [Hydromorphone Hcl] Other (See Comments)    Delirium  . Gabapentin Other (See Comments)    Reaction:  Unknown   . Losartan Other (See Comments)    Reaction:  Unknown   . Ondansetron Other (See Comments)    Reaction:  Unknown   . Prochlorperazine Other (See Comments)    Reaction:  Unknown   . Reglan [Metoclopramide]     Per patient her Dr. Evelina Bucy her off it   . Scopolamine Other (See Comments)    Reaction:  Unknown   . Zofran [Ondansetron Hcl] Other (See Comments)    Reaction:  hallucinations   .  Oxycodone Anxiety  . Tape Rash    VITALS:  Blood pressure (!) 148/46, pulse 84, temperature 98.9 F (37.2 C), temperature source Oral, resp. rate 18, height 5' 5.5" (1.664 m), weight 72 kg (158 lb 11.7 oz), SpO2 98 %.  PHYSICAL EXAMINATION:  VITAL SIGNS: Vitals:   04/23/16 1257 04/23/16 1340  BP: (!) 144/50 (!) 148/46  Pulse: 82 84  Resp: 20 18  Temp: 99.5 F (37.5 C) 98.9 F (37.2 C)   GENERAL:54 y.o.female currently in no acute distress.  HEAD: Normocephalic, atraumatic.  EYES: Pupils equal, round, reactive to light. Extraocular muscles intact. No scleral icterus.  MOUTH: Moist mucosal membrane. Dentition intact. No abscess noted.  EAR, NOSE, THROAT: Clear without exudates. No external lesions.  NECK: Supple. No thyromegaly. No nodules. No JVD.  PULMONARY: Clear to ascultation, without wheeze rails or rhonci. No use of accessory muscles, Good respiratory effort. good air entry bilaterally CHEST: Nontender to palpation.  CARDIOVASCULAR: S1 and S2. Regular rate and rhythm. No murmurs, rubs, or gallops. No edema. Pedal pulses 2+ bilaterally.  GASTROINTESTINAL: Soft, nontender, nondistended. No masses. Positive bowel sounds. No hepatosplenomegaly.  MUSCULOSKELETAL: No swelling, clubbing, or edema. Range of motion full in all extremities.  NEUROLOGIC: Cranial nerves II through XII are intact. No gross focal neurological deficits. Sensation intact. Reflexes intact.  SKIN: No ulceration, lesions, rashes, or cyanosis. Skin warm and dry. Turgor intact.  PSYCHIATRIC: Mood, affect within normal limits. The patient is awake, alert and oriented x 3. Insight,  judgment intact. Circumferential speech pattern     LABORATORY PANEL:   CBC  Recent Labs Lab 04/21/16 1731  WBC 6.9  HGB 9.3*  HCT 28.0*  PLT 254   ------------------------------------------------------------------------------------------------------------------  Chemistries   Recent Labs Lab 04/21/16 0843  04/21/16 1731  NA 132*  --   K 4.4  --   CL 92*  --   CO2 28  --   GLUCOSE 85  --   BUN 21*  --   CREATININE 4.97* 5.31*  CALCIUM 9.0  --   AST 22  --   ALT 11*  --   ALKPHOS 102  --   BILITOT 0.6  --    ------------------------------------------------------------------------------------------------------------------  Cardiac Enzymes No results for input(s): TROPONINI in the last 168 hours. ------------------------------------------------------------------------------------------------------------------  RADIOLOGY:  Mr Brain Wo Contrast  Result Date: 04/22/2016 CLINICAL DATA:  End-stage renal disease patient. Chronic encephalopathy. Nausea, vomiting and right-sided weakness. EXAM: MRI HEAD WITHOUT CONTRAST MRA HEAD WITHOUT CONTRAST TECHNIQUE: Multiplanar, multiecho pulse sequences of the brain and surrounding structures were obtained without intravenous contrast. Angiographic images of the head were obtained using MRA technique without contrast. COMPARISON:  CT 04/21/2016.  MRI 12/04/2009. FINDINGS: MRI HEAD FINDINGS Diffusion imaging does not show any acute or subacute infarction. There are old small vessel infarctions affecting the pons. No cerebellar insult. Cerebral hemispheres show mild chronic small-vessel ischemic change of the deep white matter. No cortical or large vessel territory infarction. No mass lesion, hemorrhage, hydrocephalus or extra-axial collection. No pituitary mass. No inflammatory sinus disease. MRA HEAD FINDINGS Motion degraded exam. Both internal carotid arteries are patent into the brain. Siphon regions are not well evaluated because of motion. The anterior and middle cerebral vessels show flow. Both vertebral arteries are patent to the basilar. The basilar is patent. Major posterior circulation branch vessels are patent. IMPRESSION: MRI head: No acute finding. Old small vessel infarctions affecting the pons. Minimal small vessel change affecting the cerebral  hemispheric white matter. MRA head: Severely motion degraded study. No major vessel occlusion. Electronically Signed   By: Nelson Chimes M.D.   On: 04/22/2016 10:26   US Carotid Bilateral  Result Date: 04/22/2016 CLINICAL DATA:  TIAs EXAM: BILATERAL CAROTID DUPLEX ULTRASOUND TECHNIQUE: Pearline Cables scale imaging, color Doppler and duplex ultrasound were performed of bilateral carotid and vertebral arteries in the neck. COMPARISON:  None. FINDINGS: Criteria: Quantification of carotid stenosis is based on velocity parameters that correlate the residual internal carotid diameter with NASCET-based stenosis levels, using the diameter of the distal internal carotid lumen as the denominator for stenosis measurement. The following velocity measurements were obtained: RIGHT ICA:  357/67 cm/sec CCA:  A999333 cm/sec SYSTOLIC ICA/CCA RATIO:  5.4 DIASTOLIC ICA/CCA RATIO:  9.4 ECA:  113 cm/sec LEFT ICA:  188/16 cm/sec CCA:  123456 cm/sec SYSTOLIC ICA/CCA RATIO:  2.4 DIASTOLIC ICA/CCA RATIO:  2.3 ECA:  113 cm/sec RIGHT CAROTID ARTERY: Preliminary grayscale images demonstrate significant calcific plaque in the region of the carotid bulb and proximal internal carotid artery. The waveforms, velocities and flow velocity ratios indicate a high-grade stenosis. RIGHT VERTEBRAL ARTERY:  Antegrade in nature. LEFT CAROTID ARTERY: Preliminary grayscale images demonstrate atherosclerotic plaque in the carotid bulb and proximal internal carotid artery. The waveforms, velocities and flow velocity ratios show evidence of stenosis in the 50-69% range. LEFT VERTEBRAL ARTERY:  Antegrade in nature. IMPRESSION: Bilateral carotid stenosis much worse on the right than the left as described. Vascular surgery consultation is recommended Electronically Signed   By: Inez Catalina  M.D.   On: 04/22/2016 11:22   Mr Jodene Nam Head/brain Wo Cm  Result Date: 04/22/2016 CLINICAL DATA:  End-stage renal disease patient. Chronic encephalopathy. Nausea, vomiting and right-sided  weakness. EXAM: MRI HEAD WITHOUT CONTRAST MRA HEAD WITHOUT CONTRAST TECHNIQUE: Multiplanar, multiecho pulse sequences of the brain and surrounding structures were obtained without intravenous contrast. Angiographic images of the head were obtained using MRA technique without contrast. COMPARISON:  CT 04/21/2016.  MRI 12/04/2009. FINDINGS: MRI HEAD FINDINGS Diffusion imaging does not show any acute or subacute infarction. There are old small vessel infarctions affecting the pons. No cerebellar insult. Cerebral hemispheres show mild chronic small-vessel ischemic change of the deep white matter. No cortical or large vessel territory infarction. No mass lesion, hemorrhage, hydrocephalus or extra-axial collection. No pituitary mass. No inflammatory sinus disease. MRA HEAD FINDINGS Motion degraded exam. Both internal carotid arteries are patent into the brain. Siphon regions are not well evaluated because of motion. The anterior and middle cerebral vessels show flow. Both vertebral arteries are patent to the basilar. The basilar is patent. Major posterior circulation branch vessels are patent. IMPRESSION: MRI head: No acute finding. Old small vessel infarctions affecting the pons. Minimal small vessel change affecting the cerebral hemispheric white matter. MRA head: Severely motion degraded study. No major vessel occlusion. Electronically Signed   By: Nelson Chimes M.D.   On: 04/22/2016 10:26    EKG:   Orders placed or performed during the hospital encounter of 04/21/16  . ED EKG  . ED EKG  . EKG 12-Lead  . EKG 12-Lead    ASSESSMENT AND PLAN:   [name] is a 55 y.o. female presenting with Abdominal Pain and Emesis . Admitted 04/21/2016 : Day #: 3 days   #TIA/CVA Neurology input appreciated no further recommendations Patient continues to act not herself I will order EEG and make sure she didn't have diffuse slowing suggestive of possible hypoglycemic or anoxic injury  #Hypoglycemia Patient's blood  sugars continue to be very low in the 40s She is not eating that I will go ahead and start her on D5 containing fluid  #Acute cystitis with abnormal urinalysis Urine culture with Proteus. Continue IV Rocephin changed to Ceftin tomorrow   #Intractable nausea and vomiting with generalized abdominal pain Continue antiemetics  #End stage renal disease Continue hemodialysis per nephrology. Currently receiving dialysis  #Hallucination with history of depression Psychiatry consult appreciated  All the records are reviewed and case discussed with Care Management/Social Workerr. Management plans discussed with the patient, family and they are in agreement.  CODE STATUS: full TOTAL TIME TAKING CARE OF THIS PATIENT: 28 minutes.   POSSIBLE D/C IN 1-2DAYS, DEPENDING ON CLINICAL CONDITION.   Dustin Flock M.D on 04/23/2016 at 1:52 PM  Between 7am to 6pm - Pager - [phone]  After 6pm: House Pager: - [phone]  [facility]  Office  [phone]  CC: Primary care physician; Ellamae Sia, MD

## 2016-04-23 NOTE — Progress Notes (Signed)
Pt spitting up juice this AM.  Sheets and gown changed. Pt constantly clearing throat and making sounds like she is going to be sick., however has not vomited. Phenergan and Protonix given prior to breakfast but made no difference regarding patient appetite. Spoke with Dr Posey Pronto new orders received.

## 2016-04-23 NOTE — Progress Notes (Signed)
OT Cancellation Note  Patient Details Name: CATELIN VOGEN MRN: [id number] DOB: 20-Mar-1961   Cancelled Treatment:    Reason Eval/Treat Not Completed: Patient at procedure or test/ unavailable (Dialysis this a.m.)   Harrel Carina, MS, OTR/L  Harrel Carina 04/23/2016, 12:48 PM

## 2016-04-23 NOTE — Plan of Care (Signed)
Problem: SLP Dysphagia Goals Goal: Misc Dysphagia Goal Pt will safely tolerate po diet of least restrictive consistency w/ no overt s/s of aspiration noted by Staff/pt/family x3 sessions.    

## 2016-04-23 NOTE — Progress Notes (Signed)
[name] MRN: [id number] DOB: 26-Jun-1961   Cancelled Treatment:    Reason Eval/Treat Not Completed: Patient at procedure or test/unavailable (Evaluation re-attempted.  Patient currently off unit for dialysis.  Will re-attempt at later time/date as medically appropriate.)   Sahith Nurse H. Owens Shark, PT, DPT, NCS 04/23/16, 9:57 AM [phone]

## 2016-04-23 NOTE — Progress Notes (Addendum)
Pt arrived back for dialysis with a blood sugar of 40 at 1336. Pt alert but confused more than usual talking to  Imaginary people. Juice and sugar given as well as peaches. Rechecked blood sugar at 1354 sugar was 35.amp of D50 given pt sugar at 1405 was 195. Pt confusion and agitation continues. I asked her who she is speaking she responds with random names. Dr Carlena Hurl is on the floor made him aware of her behavior. Charge nurse aware plan is to move her closer to nurses station Dr Posey Pronto made aware as well new orders received.

## 2016-04-23 NOTE — Progress Notes (Signed)
Dialysis started 

## 2016-04-23 NOTE — Progress Notes (Signed)
[name] MRN: [id number] DOB: 24-Jun-1961   Cancelled Treatment:    Reason Eval/Treat Not Completed: Medical issues which prohibited therapy (Per chart review, patient returned from dialysis with significant hypoglycemic episoes (requiring D50); noted with worsening confusion/agitation.  Will continue hold at this time and re-attempt next date as medically appropriate.)   Domanik [place] H. Owens Shark, PT, DPT, NCS 04/23/16, 3:03 PM [phone]

## 2016-04-24 DIAGNOSIS — I2510 Atherosclerotic heart disease of native coronary artery without angina pectoris: Secondary | ICD-10-CM

## 2016-04-24 DIAGNOSIS — E1140 Type 2 diabetes mellitus with diabetic neuropathy, unspecified: Secondary | ICD-10-CM

## 2016-04-24 DIAGNOSIS — J449 Chronic obstructive pulmonary disease, unspecified: Secondary | ICD-10-CM

## 2016-04-24 DIAGNOSIS — R590 Localized enlarged lymph nodes: Secondary | ICD-10-CM

## 2016-04-24 DIAGNOSIS — I5032 Chronic diastolic (congestive) heart failure: Secondary | ICD-10-CM

## 2016-04-24 DIAGNOSIS — R531 Weakness: Secondary | ICD-10-CM

## 2016-04-24 DIAGNOSIS — E1122 Type 2 diabetes mellitus with diabetic chronic kidney disease: Secondary | ICD-10-CM

## 2016-04-24 DIAGNOSIS — K219 Gastro-esophageal reflux disease without esophagitis: Secondary | ICD-10-CM

## 2016-04-24 DIAGNOSIS — R112 Nausea with vomiting, unspecified: Secondary | ICD-10-CM

## 2016-04-24 DIAGNOSIS — Z79899 Other long term (current) drug therapy: Secondary | ICD-10-CM

## 2016-04-24 DIAGNOSIS — I132 Hypertensive heart and chronic kidney disease with heart failure and with stage 5 chronic kidney disease, or end stage renal disease: Secondary | ICD-10-CM

## 2016-04-24 DIAGNOSIS — I252 Old myocardial infarction: Secondary | ICD-10-CM

## 2016-04-24 DIAGNOSIS — D631 Anemia in chronic kidney disease: Secondary | ICD-10-CM

## 2016-04-24 DIAGNOSIS — F99 Mental disorder, not otherwise specified: Secondary | ICD-10-CM

## 2016-04-24 LAB — GLUCOSE, CAPILLARY
Glucose-Capillary: 61 mg/dL — ABNORMAL LOW (ref 65–99)
Glucose-Capillary: 157 mg/dL — ABNORMAL HIGH (ref 65–99)
Glucose-Capillary: 86 mg/dL (ref 65–99)
Glucose-Capillary: 118 mg/dL — ABNORMAL HIGH (ref 65–99)
Glucose-Capillary: 154 mg/dL — ABNORMAL HIGH (ref 65–99)
Glucose-Capillary: 147 mg/dL — ABNORMAL HIGH (ref 65–99)

## 2016-04-24 LAB — IRON AND TIBC
Iron: 9 ug/dL — ABNORMAL LOW (ref 28–170)
Saturation Ratios: 7 % — ABNORMAL LOW (ref 10.4–31.8)
TIBC: 127 ug/dL — ABNORMAL LOW (ref 250–450)
UIBC: 118 ug/dL

## 2016-04-24 LAB — BLOOD GAS, ARTERIAL
Acid-Base Excess: 6.6 mmol/L — ABNORMAL HIGH (ref 0.0–3.0)
Allens test (pass/fail): POSITIVE — AB
Bicarbonate: 30.5 mEq/L — ABNORMAL HIGH (ref 21.0–28.0)
FIO2: 0.28
O2 Saturation: 95.5 %
Patient temperature: 37
pCO2 arterial: 40 mmHg (ref 32.0–48.0)
pH, Arterial: 7.49 — ABNORMAL HIGH (ref 7.350–7.450)
pO2, Arterial: 72 mmHg — ABNORMAL LOW (ref 83.0–108.0)

## 2016-04-24 LAB — FERRITIN: Ferritin: 871 ng/mL — ABNORMAL HIGH (ref 11–307)

## 2016-04-24 LAB — CORTISOL: Cortisol, Plasma: 16.8 ug/dL

## 2016-04-24 LAB — LACTATE DEHYDROGENASE: LDH: 150 U/L (ref 98–192)

## 2016-04-24 MED ORDER — DEXTROSE 50 % IV SOLN
1.0000 | Freq: Once | INTRAVENOUS | Status: AC
  Administered 2016-04-24: 50 mL via INTRAVENOUS
  Filled 2016-04-24: qty 50

## 2016-04-24 MED ORDER — NEPRO/CARBSTEADY PO LIQD: 237.0000 mL | Freq: Two times a day (BID) | ORAL | Status: DC

## 2016-04-24 MED ORDER — CEFUROXIME AXETIL 250 MG PO TABS
250.0000 mg | ORAL_TABLET | ORAL | Status: AC
  Administered 2016-04-24 – 2016-04-25 (×2): 250 mg via ORAL
  Filled 2016-04-24 (×2): qty 1

## 2016-04-24 MED ORDER — ALBUTEROL SULFATE (2.5 MG/3ML) 0.083% IN NEBU
2.5000 mg | INHALATION_SOLUTION | RESPIRATORY_TRACT | Status: DC
  Administered 2016-04-24 – 2016-04-25 (×7): 2.5 mg via RESPIRATORY_TRACT
  Filled 2016-04-24 (×7): qty 3

## 2016-04-24 NOTE — Consult Note (Signed)
Periods of confusion and disorientation  Currently following commands.    Past Medical History:  Diagnosis Date  . Anemia   . Anxiety   . Arthritis   . Asthma   . chronic diastolic CHF [id number]  . Collagen vascular disease ([place])   . COPD (chronic obstructive pulmonary disease) ([place])   . Coronary artery disease    a. cath 2013: stenting to RCA (report not available); b. cath 2014: LM nl, pLAD 40%, mLAD nl, ost LCx 40%, mid LCx nl, pRCA 30% @ site of prior stent, mRCA 50%  . Depression   . Diabetes mellitus without complication ([place])   . Diabetic neuropathy ([place])   . dialysis 2006  . ESRD (end stage renal disease) on dialysis ([place])    M-W-F  . GERD (gastroesophageal reflux disease)   . Headache   . Hx of pancreatitis 2015  . Hypertension   . Mitral regurgitation    a. echo 10/2013: EF 62%, noWMA, mildly dilated LA, mild to mod MR/TR, GR1DD  . Myocardial infarction ([place])   . Pneumonia   . Renal insufficiency     Past Surgical History:  Procedure Laterality Date  . ABDOMINAL HYSTERECTOMY    . APPENDECTOMY    . CARDIAC CATHETERIZATION Left 07/26/2015   Procedure: Left Heart Cath and Coronary Angiography;  Surgeon: Dionisio David, MD;  Location: [facility];  Service: Cardiovascular;  Laterality: Left;  . CHOLECYSTECTOMY    . DIALYSIS FISTULA CREATION Left    upper arm  . ESOPHAGOGASTRODUODENOSCOPY N/A 03/08/2015   Procedure: ESOPHAGOGASTRODUODENOSCOPY (EGD);  Surgeon: Manya Silvas, MD;  Location: [facility] ENDOSCOPY;  Service: Endoscopy;  Laterality: N/A;  . ESOPHAGOGASTRODUODENOSCOPY (EGD) WITH PROPOFOL N/A 03/18/2016   Procedure: ESOPHAGOGASTRODUODENOSCOPY (EGD) WITH PROPOFOL;  Surgeon: [name], MD;  Location: [facility] ENDOSCOPY;  Service: Endoscopy;  Laterality: N/A;  . EYE SURGERY    . FECAL TRANSPLANT N/A 08/23/2015   Procedure: FECAL TRANSPLANT;  Surgeon: Manya Silvas, MD;  Location: [facility] ENDOSCOPY;  Service: Endoscopy;  Laterality: N/A;  . PERIPHERAL  VASCULAR CATHETERIZATION N/A 12/20/2015   Procedure: Thrombectomy of dialysis access versus permcath placement;  Surgeon: Algernon Huxley, MD;  Location: [place] [facility];  Service: Cardiovascular;  Laterality: N/A;  . PERIPHERAL VASCULAR CATHETERIZATION N/A 12/20/2015   Procedure: A/V Shunt Intervention;  Surgeon: Algernon Huxley, MD;  Location: [place] [facility];  Service: Cardiovascular;  Laterality: N/A;  . PERIPHERAL VASCULAR CATHETERIZATION N/A 12/20/2015   Procedure: A/V Shuntogram/Fistulagram;  Surgeon: Algernon Huxley, MD;  Location: [place] [facility];  Service: Cardiovascular;  Laterality: N/A;  . PERIPHERAL VASCULAR CATHETERIZATION N/A 01/02/2016   Procedure: A/V Shuntogram/Fistulagram;  Surgeon: Algernon Huxley, MD;  Location: [place] [facility];  Service: Cardiovascular;  Laterality: N/A;  . PERIPHERAL VASCULAR CATHETERIZATION N/A 01/02/2016   Procedure: A/V Shunt Intervention;  Surgeon: Algernon Huxley, MD;  Location: [place] [facility];  Service: Cardiovascular;  Laterality: N/A;    Family History  Problem Relation Age of Onset  . Kidney disease Mother   . Diabetes Mother     Social History:  reports that she quit smoking about 14 months ago. Her smoking use included Cigarettes. She smoked 0.50 packs per day. She has never used smokeless tobacco. She reports that she does not drink alcohol or use drugs.  Allergies  Allergen Reactions  . Compazine [Prochlorperazine Edisylate] Anaphylaxis and Nausea And Vomiting    23 Jul - patient relates that she takes promethazine frequently  with no problems.  . Ace Inhibitors Swelling  . Ativan [Lorazepam] Other (See Comments)    Reaction:  Hallucinations and headaches  . Codeine Nausea And Vomiting  . Dilaudid [Hydromorphone Hcl] Other (See Comments)    Delirium  . Gabapentin Other (See Comments)    Reaction:  Unknown   . Losartan Other (See Comments)    Reaction:  Unknown   . Ondansetron Other (See Comments)    Reaction:  Unknown   .  Prochlorperazine Other (See Comments)    Reaction:  Unknown   . Reglan [Metoclopramide]     Per patient her Dr. Evelina Bucy her off it   . Scopolamine Other (See Comments)    Reaction:  Unknown   . Zofran [Ondansetron Hcl] Other (See Comments)    Reaction:  hallucinations   . Oxycodone Anxiety  . Tape Rash    Medications:  Prior to Admission:  Prescriptions Prior to Admission  Medication Sig Dispense Refill Last Dose  . acidophilus (RISAQUAD) CAPS capsule Take 1 capsule by mouth daily.   unknown at unknown  . albuterol (PROVENTIL HFA;VENTOLIN HFA) 108 (90 Base) MCG/ACT inhaler Inhale 2 puffs into the lungs every 4 (four) hours as needed for wheezing or shortness of breath.   unknown at unknown  . albuterol (PROVENTIL) (2.5 MG/3ML) 0.083% nebulizer solution Take 2.5 mg by nebulization every 4 (four) hours as needed for wheezing or shortness of breath.   unknown at unknown  . amLODipine (NORVASC) 10 MG tablet Take 10 mg by mouth daily.   unknown at unknown  . aspirin EC 81 MG tablet Take 81 mg by mouth every morning.    unknown at unknown  . atorvastatin (LIPITOR) 20 MG tablet Take 20 mg by mouth every evening.   unknown at unknown  . cetirizine (ZYRTEC) 10 MG tablet Take 10 mg by mouth daily.    unknown at unknown  . clopidogrel (PLAVIX) 75 MG tablet Take 75 mg by mouth daily. Reported on 12/17/2015    at unknown  . FLUoxetine (PROZAC) 20 MG capsule Take 1 capsule (20 mg total) by mouth daily. 30 capsule 0 unknown at unknown  . glucose 4 GM chewable tablet Chew 3-4 tablets by mouth as needed for low blood sugar.   unknown at unknown  . hydrALAZINE (APRESOLINE) 25 MG tablet Take 25 mg by mouth 3 (three) times daily.   unknown at unknown  . HYDROcodone-acetaminophen (NORCO/VICODIN) 5-325 MG tablet Take 1 tablet by mouth every 6 (six) hours as needed for moderate pain or severe pain. (Patient taking differently: Take 1 tablet by mouth 2 (two) times daily as needed for moderate pain or severe pain. )  20 tablet 0 unknown at unknown  . lipase/protease/amylase (CREON) 12000 UNITS CPEP capsule Take 12,000-36,000 Units by mouth See admin instructions. 36,000 units daily with breakfast; 24,000 units daily with lunch, and 36,000 units daily with dinner   unknown at unknown  . multivitamin (RENA-VIT) TABS tablet Take 1 tablet by mouth daily.   unknown at unknown  . OLANZapine (ZYPREXA) 2.5 MG tablet Take 1 tablet (2.5 mg total) by mouth at bedtime. 30 tablet 0 unknown at unknown  . omega-3 acid ethyl esters (LOVAZA) 1 g capsule Take 1 g by mouth daily.   unknown at unknown  . pantoprazole (PROTONIX) 40 MG tablet Take 1 tablet (40 mg total) by mouth 2 (two) times daily before a meal. 60 tablet 2 unknown at unknown  . promethazine (PHENERGAN) 12.5 MG tablet Take 12.5 mg  by mouth every 8 (eight) hours as needed for nausea.   unknown at unknown  . ranitidine (ZANTAC) 150 MG tablet Take 150 mg by mouth daily.    unknown at unknown  . ranolazine (RANEXA) 1000 MG SR tablet Take 1,000 mg by mouth 2 (two) times daily.   unknown at unknown  . rifaximin (XIFAXAN) 550 MG TABS tablet Take 1 tablet (550 mg total) by mouth 2 (two) times daily. 60 tablet 0 unknown at unknown  . sevelamer carbonate (RENVELA) 800 MG tablet Take 800 mg by mouth 3 (three) times daily with meals.     at unknown  . vitamin B-12 (CYANOCOBALAMIN) 1000 MCG tablet Take 1,000 mcg by mouth daily.   unknown at unknown  . pregabalin (LYRICA) 25 MG capsule Take 25 mg by mouth daily.   unknown at unknown    ROS: History obtained from the patient  General ROS: negative for - chills, fatigue, fever, night sweats, weight gain or weight loss Psychological ROS: negative for - behavioral disorder, hallucinations, memory difficulties, mood swings or suicidal ideation Ophthalmic ROS: negative for - blurry vision, double vision, eye pain or loss of vision ENT ROS: negative for - epistaxis, nasal discharge, oral lesions, sore throat, tinnitus or  vertigo Allergy and Immunology ROS: negative for - hives or itchy/watery eyes Hematological and Lymphatic ROS: negative for - bleeding problems, bruising or swollen lymph nodes Endocrine ROS: negative for - galactorrhea, hair pattern changes, polydipsia/polyuria or temperature intolerance Respiratory ROS: negative for - cough, hemoptysis, shortness of breath or wheezing Cardiovascular ROS: negative for - chest pain, dyspnea on exertion, edema or irregular heartbeat Gastrointestinal ROS: negative for - abdominal pain, diarrhea, hematemesis, nausea/vomiting or stool incontinence Genito-Urinary ROS: negative for - dysuria, hematuria, incontinence or urinary frequency/urgency Musculoskeletal ROS: negative for - joint swelling or muscular weakness Neurological ROS: as noted in HPI Dermatological ROS: negative for rash and skin lesion changes  Physical Examination: Blood pressure 135/65, pulse 71, temperature 98.6 F (37 C), temperature source Oral, resp. rate 18, height 5' 5.5" (1.664 m), weight 72 kg (158 lb 11.7 oz), SpO2 100 %.   Neurological Examination Mental Status: Alert, oriented, thought content appropriate.  Speech fluent without evidence of aphasia.  Able to follow 3 step commands without difficulty. Cranial Nerves: II: Discs flat bilaterally; Visual fields grossly normal, pupils equal, round, reactive to light and accommodation III,IV, VI: ptosis not present, extra-ocular motions intact bilaterally V,VII: smile symmetric, facial light touch sensation normal bilaterally VIII: hearing normal bilaterally IX,X: gag reflex present XI: bilateral shoulder shrug XII: midline tongue extension Motor: Right : Upper extremity   5/5    Left:     Upper extremity   5/5  Lower extremity   4/5     Lower extremity   4/5 Tone and bulk:normal tone throughout; no atrophy noted Sensory: Pinprick and light touch intact throughout, bilaterally Deep Tendon Reflexes: 1+ and symmetric  throughout Plantars: Right: downgoing   Left: downgoing Cerebellar: Not tested Gait: normal gait and station      Laboratory Studies:   Basic Metabolic Panel:  Recent Labs Lab 04/21/16 0843 04/21/16 1731  NA 132*  --   K 4.4  --   CL 92*  --   CO2 28  --   GLUCOSE 85  --   BUN 21*  --   CREATININE 4.97* 5.31*  CALCIUM 9.0  --     Liver Function Tests:  Recent Labs Lab 04/21/16 0843  AST 22  ALT 11*  ALKPHOS 102  BILITOT 0.6  PROT 8.4*  ALBUMIN 3.4*    Recent Labs Lab 04/21/16 0843  LIPASE <10*    Recent Labs Lab 04/21/16 0843 04/23/16 1459  AMMONIA 27 39*    CBC:  Recent Labs Lab 04/21/16 0843 04/21/16 1731  WBC 7.1 6.9  NEUTROABS 4.3  --   HGB 9.3* 9.3*  HCT 28.3* 28.0*  MCV 74.2* 73.5*  PLT 257 254    Cardiac Enzymes: No results for input(s): CKTOTAL, CKMB, CKMBINDEX, TROPONINI in the last 168 hours.  BNP: Invalid input(s): POCBNP  CBG:  Recent Labs Lab 04/23/16 2305 04/24/16 0311 04/24/16 0356 04/24/16 0731 04/24/16 1226  GLUCAP 193* 61* 157* 67 118*    Microbiology: Results for orders placed or performed during the hospital encounter of 04/21/16  Urine culture     Status: Abnormal   Collection Time: 04/21/16  9:19 AM  Result Value Ref Range Status   Specimen Description URINE, CLEAN CATCH  Final   Special Requests Normal  Final   Culture >=100,000 COLONIES/mL PROTEUS MIRABILIS (A)  Final   Report Status 04/23/2016 FINAL  Final   Organism ID, Bacteria PROTEUS MIRABILIS (A)  Final      Susceptibility   Proteus mirabilis - MIC*    AMPICILLIN <=2 SENSITIVE Sensitive     CEFAZOLIN 8 SENSITIVE Sensitive     CEFTRIAXONE <=1 SENSITIVE Sensitive     CIPROFLOXACIN <=0.25 SENSITIVE Sensitive     GENTAMICIN <=1 SENSITIVE Sensitive     IMIPENEM 4 SENSITIVE Sensitive     NITROFURANTOIN 256 RESISTANT Resistant     TRIMETH/SULFA <=20 SENSITIVE Sensitive     AMPICILLIN/SULBACTAM <=2 SENSITIVE Sensitive     PIP/TAZO <=4  SENSITIVE Sensitive     * >=100,000 COLONIES/mL PROTEUS MIRABILIS  MRSA PCR Screening     Status: None   Collection Time: 04/21/16  6:07 PM  Result Value Ref Range Status   MRSA by PCR NEGATIVE NEGATIVE Final    Comment:        The GeneXpert MRSA Assay (FDA approved for NASAL specimens only), is one component of a comprehensive MRSA colonization surveillance program. It is not intended to diagnose MRSA infection nor to guide or monitor treatment for MRSA infections.     Coagulation Studies: No results for input(s): LABPROT, INR in the last 72 hours.  Urinalysis:   Recent Labs Lab 04/21/16 0919  COLORURINE YELLOW*  LABSPEC 1.010  PHURINE 8.0  GLUCOSEU NEGATIVE  HGBUR 2+*  BILIRUBINUR NEGATIVE  KETONESUR NEGATIVE  PROTEINUR 100*  NITRITE NEGATIVE  LEUKOCYTESUR 3+*    Lipid Panel:     Component Value Date/Time   CHOL 106 04/22/2016 0442   CHOL 85 05/31/2014 0432   TRIG 59 04/22/2016 0442   TRIG 104 05/31/2014 0432   HDL 72 04/22/2016 0442   HDL 43 05/31/2014 0432   CHOLHDL 1.5 04/22/2016 0442   VLDL 12 04/22/2016 0442   VLDL 21 05/31/2014 0432   LDLCALC 22 04/22/2016 0442   LDLCALC 21 05/31/2014 0432    HgbA1C:  Lab Results  Component Value Date   HGBA1C 5.1 04/08/2016    Urine Drug Screen:      Component Value Date/Time   LABOPIA POSITIVE (A) 04/21/2016 0919   COCAINSCRNUR NONE DETECTED 04/21/2016 0919   LABBENZ NONE DETECTED 04/21/2016 0919   AMPHETMU NONE DETECTED 04/21/2016 0919   THCU NONE DETECTED 04/21/2016 0919   LABBARB NONE DETECTED 04/21/2016 0919    Alcohol Level:   Recent Labs Lab  04/21/16 [place] <5     Imaging: No results found.   Assessment/Plan:  55 y.o. female with history of DM, HTN, CAD, ESRD on HD multiple admissions in past.  Last one was 7/18 for confusion and disorientation.    Pt has occasional periods of confusion and disorientation  Currently tells me place/time/location and is following commands EEG  preliminary results a lot of artifact due to agitation but no epileptiform activity Would not start anti epileptics.  Leotis Pain  04/24/2016, 2:31 PM

## 2016-04-24 NOTE — Progress Notes (Signed)
PT is recommending SNF. Clinical Social Worker (CSW) attempted to meet with patient however she was asleep. Patient did wake up however she appeared confused and did not answer questions appropriately. CSW contacted patient's son [name] and left a voicemail. CSW also attempted to call patient's sister [name] however she did not answer and a voicemail could not be left. CSW will continue to follow and assist as needed.   [employer], LCSW [phone]

## 2016-04-24 NOTE — Progress Notes (Signed)
[facility]  ROUNDING NOTE   Subjective:   Patient continues to be confused.  Family at bedside.  Hemodialysis yesterday. Tolerated treatment well. UF 0.5 litre   Objective:  Vital signs in last 24 hours:  Temp:  [97.6 F (36.4 C)-100 F (37.8 C)] 97.6 F (36.4 C) (08/03 1049) Pulse Rate:  [64-84] 66 (08/03 1049) Resp:  [16-24] 16 (08/03 1049) BP: (116-171)/(44-102) 128/102 (08/03 1049) SpO2:  [91 %-100 %] 100 % (08/03 1049) Weight:  [72 kg (158 lb 11.7 oz)] 72 kg (158 lb 11.7 oz) (08/02 1257)  Weight change: 2.3 kg (5 lb 1.1 oz) Filed Weights   04/22/16 1110 04/23/16 0915 04/23/16 1257  Weight: 70.3 kg (154 lb 15.7 oz) 72.6 kg (160 lb 0.9 oz) 72 kg (158 lb 11.7 oz)    Intake/Output: I/O last 3 completed shifts: In: 720 [P.O.:720] Out: 500 [Other:500]   Intake/Output this shift:  No intake/output data recorded.  Physical Exam: General: NAD, laying in bed  Head: Normocephalic, atraumatic. Moist oral mucosal membranes  Eyes: Anicteric, PERRL  Neck: Supple, trachea midline  Lungs:  Clear to auscultation  Heart: Regular rate and rhythm  Abdomen:  Soft, nontender  Extremities: no peripheral edema.  Neurologic: Nonfocal, moving all four extremities  Skin: No lesions  Access: Left AVF     Basic Metabolic Panel:  Recent Labs Lab 04/21/16 0843 04/21/16 1731  NA 132*  --   K 4.4  --   CL 92*  --   CO2 28  --   GLUCOSE 85  --   BUN 21*  --   CREATININE 4.97* 5.31*  CALCIUM 9.0  --     Liver Function Tests:  Recent Labs Lab 04/21/16 0843  AST 22  ALT 11*  ALKPHOS 102  BILITOT 0.6  PROT 8.4*  ALBUMIN 3.4*    Recent Labs Lab 04/21/16 0843  LIPASE <10*    Recent Labs Lab 04/21/16 0843 04/23/16 1459  AMMONIA 27 39*    CBC:  Recent Labs Lab 04/21/16 0843 04/21/16 1731  WBC 7.1 6.9  NEUTROABS 4.3  --   HGB 9.3* 9.3*  HCT 28.3* 28.0*  MCV 74.2* 73.5*  PLT 257 254    Cardiac Enzymes: No results for input(s): CKTOTAL,  CKMB, CKMBINDEX, TROPONINI in the last 168 hours.  BNP: Invalid input(s): POCBNP  CBG:  Recent Labs Lab 04/23/16 2219 04/23/16 2305 04/24/16 0311 04/24/16 0356 04/24/16 0731  GLUCAP 63* 193* 61* 157* 86    Microbiology: Results for orders placed or performed during the hospital encounter of 04/21/16  Urine culture     Status: Abnormal   Collection Time: 04/21/16  9:19 AM  Result Value Ref Range Status   Specimen Description URINE, CLEAN CATCH  Final   Special Requests Normal  Final   Culture >=100,000 COLONIES/mL PROTEUS MIRABILIS (A)  Final   Report Status 04/23/2016 FINAL  Final   Organism ID, Bacteria PROTEUS MIRABILIS (A)  Final      Susceptibility   Proteus mirabilis - MIC*    AMPICILLIN <=2 SENSITIVE Sensitive     CEFAZOLIN 8 SENSITIVE Sensitive     CEFTRIAXONE <=1 SENSITIVE Sensitive     CIPROFLOXACIN <=0.25 SENSITIVE Sensitive     GENTAMICIN <=1 SENSITIVE Sensitive     IMIPENEM 4 SENSITIVE Sensitive     NITROFURANTOIN 256 RESISTANT Resistant     TRIMETH/SULFA <=20 SENSITIVE Sensitive     AMPICILLIN/SULBACTAM <=2 SENSITIVE Sensitive     PIP/TAZO <=4 SENSITIVE Sensitive     * >=  100,000 COLONIES/mL PROTEUS MIRABILIS  MRSA PCR Screening     Status: None   Collection Time: 04/21/16  6:07 PM  Result Value Ref Range Status   MRSA by PCR NEGATIVE NEGATIVE Final    Comment:        The GeneXpert MRSA Assay (FDA approved for NASAL specimens only), is one component of a comprehensive MRSA colonization surveillance program. It is not intended to diagnose MRSA infection nor to guide or monitor treatment for MRSA infections.     Coagulation Studies: No results for input(s): LABPROT, INR in the last 72 hours.  Urinalysis: No results for input(s): COLORURINE, LABSPEC, PHURINE, GLUCOSEU, HGBUR, BILIRUBINUR, KETONESUR, PROTEINUR, UROBILINOGEN, NITRITE, LEUKOCYTESUR in the last 72 hours.  Invalid input(s): APPERANCEUR    Imaging: US Carotid Bilateral  Result  Date: 04/22/2016 CLINICAL DATA:  TIAs EXAM: BILATERAL CAROTID DUPLEX ULTRASOUND TECHNIQUE: Pearline Cables scale imaging, color Doppler and duplex ultrasound were performed of bilateral carotid and vertebral arteries in the neck. COMPARISON:  None. FINDINGS: Criteria: Quantification of carotid stenosis is based on velocity parameters that correlate the residual internal carotid diameter with NASCET-based stenosis levels, using the diameter of the distal internal carotid lumen as the denominator for stenosis measurement. The following velocity measurements were obtained: RIGHT ICA:  357/67 cm/sec CCA:  [id number] cm/sec SYSTOLIC ICA/CCA RATIO:  5.4 DIASTOLIC ICA/CCA RATIO:  9.4 ECA:  113 cm/sec LEFT ICA:  188/16 cm/sec CCA:  [id number] cm/sec SYSTOLIC ICA/CCA RATIO:  2.4 DIASTOLIC ICA/CCA RATIO:  2.3 ECA:  113 cm/sec RIGHT CAROTID ARTERY: Preliminary grayscale images demonstrate significant calcific plaque in the region of the carotid bulb and proximal internal carotid artery. The waveforms, velocities and flow velocity ratios indicate a high-grade stenosis. RIGHT VERTEBRAL ARTERY:  Antegrade in nature. LEFT CAROTID ARTERY: Preliminary grayscale images demonstrate atherosclerotic plaque in the carotid bulb and proximal internal carotid artery. The waveforms, velocities and flow velocity ratios show evidence of stenosis in the 50-69% range. LEFT VERTEBRAL ARTERY:  Antegrade in nature. IMPRESSION: Bilateral carotid stenosis much worse on the right than the left as described. Vascular surgery consultation is recommended Electronically Signed   By: Inez Catalina M.D.   On: 04/22/2016 11:22     Medications:   . dextrose 50 mL/hr at 04/23/16 2019   . acidophilus  1 capsule Oral Daily  . albuterol  2.5 mg Inhalation Q4H  . amLODipine  10 mg Oral Daily  . aspirin EC  81 mg Oral BH-q7a  . atorvastatin  20 mg Oral QPM  . cefTRIAXone (ROCEPHIN)  IV  1 g Intravenous Once  . cefTRIAXone (ROCEPHIN)  IV  1 g Intravenous Q24H  . clopidogrel   75 mg Oral Daily  . FLUoxetine  20 mg Oral Daily  . heparin subcutaneous  5,000 Units Subcutaneous Q12H  . hydrALAZINE  25 mg Oral TID  . lipase/protease/amylase  24,000 Units Oral Q lunch  . lipase/protease/amylase  36,000 Units Oral BID  . loratadine  10 mg Oral Daily  . OLANZapine  5 mg Oral QHS  . pantoprazole  40 mg Oral BID AC  . pregabalin  25 mg Oral Daily  . promethazine  12.5 mg Intravenous Q6H  . ranolazine  1,000 mg Oral BID  . rifaximin  550 mg Oral BID  . sevelamer carbonate  800 mg Oral TID WC   butalbital-acetaminophen-caffeine, haloperidol lactate, HYDROcodone-acetaminophen, ipratropium-albuterol, morphine injection  Assessment/ Plan:  [name] is a 55 y.o. black female  with insulin dependent diabetes mellitus type II,  hypertension, hyperlipidemia, diabetic retinopathy, diabetic neuropathy, diabetic gastroparesis, pancreatic insufficiency, depression, GERD. Renal masses - avoiding epo   MWF [facility] [address]   1. End Stage Renal Disease: seen and examined on hemodialysis. Tolerated treatment well. UF minimal.  - Continue MWF schedule  2. Anemia of chronic kidney disease: Not getting erythropoeitin stimulating agent as outpatient due to renal masses. However CT from 6/24 does not seem to show any renal masses.  - Consult hematology.   3. Secondary Hyperparathyroidism:  - Continue sevelamer  4. Hypertension: at goal.   - amlodipine and hydralazine.   5. Encephalopathy: appreciate psych input. CT head on 7/18 negative.  - olanzapine - Consult Neurology.    LOS: [place], [place] 8/3/201710:58 AM

## 2016-04-24 NOTE — Consult Note (Signed)
[facility] CONSULT NOTE  Patient Care Team: [name], MD as PCP - General (Internal Medicine)  CHIEF COMPLAINTS/PURPOSE OF CONSULTATION:  CC: For erythropoietin/the masses  HISTORY OF PRESENTING ILLNESS: Please note patient is a poor historian/she is sleepy [recived haldol]. Hence history is taken reviewing the chart [name] 55 y.o.  female with multiple medical problems including end-stage renal disease on hemodialysis; also congestive heart failure; poorly controlled diabetes; "history of renal masses"- is currently admitted to the hospital for nausea vomiting and focal weakness.  In view of patient's anemia likely from CKD- nephrology has been contemplating on erythropoietin stimulating agents. However patient had prior history of renal masses on the CT scan. Hematology has been consult for further recommendations.  ROS: Unable to assess.  MEDICAL HISTORY:  Past Medical History:  Diagnosis Date  . Anemia   . Anxiety   . Arthritis   . Asthma   . chronic diastolic CHF [id number]  . Collagen vascular disease ([place])   . COPD (chronic obstructive pulmonary disease) ([place])   . Coronary artery disease    a. cath 2013: stenting to RCA (report not available); b. cath 2014: LM nl, pLAD 40%, mLAD nl, ost LCx 40%, mid LCx nl, pRCA 30% @ site of prior stent, mRCA 50%  . Depression   . Diabetes mellitus without complication ([place])   . Diabetic neuropathy ([place])   . dialysis 2006  . ESRD (end stage renal disease) on dialysis ([place])    M-W-F  . GERD (gastroesophageal reflux disease)   . Headache   . Hx of pancreatitis 2015  . Hypertension   . Mitral regurgitation    a. echo 10/2013: EF 62%, noWMA, mildly dilated LA, mild to mod MR/TR, GR1DD  . Myocardial infarction ([place])   . Pneumonia   . Renal insufficiency     SURGICAL HISTORY: Past Surgical History:  Procedure Laterality Date  . ABDOMINAL HYSTERECTOMY    . APPENDECTOMY    . CARDIAC CATHETERIZATION Left  07/26/2015   Procedure: Left Heart Cath and Coronary Angiography;  Surgeon: Dionisio David, MD;  Location: [place] [facility];  Service: Cardiovascular;  Laterality: Left;  . CHOLECYSTECTOMY    . DIALYSIS FISTULA CREATION Left    upper arm  . ESOPHAGOGASTRODUODENOSCOPY N/A 03/08/2015   Procedure: ESOPHAGOGASTRODUODENOSCOPY (EGD);  Surgeon: Manya Silvas, MD;  Location: [facility] ENDOSCOPY;  Service: Endoscopy;  Laterality: N/A;  . ESOPHAGOGASTRODUODENOSCOPY (EGD) WITH PROPOFOL N/A 03/18/2016   Procedure: ESOPHAGOGASTRODUODENOSCOPY (EGD) WITH PROPOFOL;  Surgeon: [name], MD;  Location: [facility] ENDOSCOPY;  Service: Endoscopy;  Laterality: N/A;  . EYE SURGERY    . FECAL TRANSPLANT N/A 08/23/2015   Procedure: FECAL TRANSPLANT;  Surgeon: Manya Silvas, MD;  Location: [facility] ENDOSCOPY;  Service: Endoscopy;  Laterality: N/A;  . PERIPHERAL VASCULAR CATHETERIZATION N/A 12/20/2015   Procedure: Thrombectomy of dialysis access versus permcath placement;  Surgeon: Algernon Huxley, MD;  Location: [place] [facility];  Service: Cardiovascular;  Laterality: N/A;  . PERIPHERAL VASCULAR CATHETERIZATION N/A 12/20/2015   Procedure: A/V Shunt Intervention;  Surgeon: Algernon Huxley, MD;  Location: [place] [facility];  Service: Cardiovascular;  Laterality: N/A;  . PERIPHERAL VASCULAR CATHETERIZATION N/A 12/20/2015   Procedure: A/V Shuntogram/Fistulagram;  Surgeon: Algernon Huxley, MD;  Location: [place] [facility];  Service: Cardiovascular;  Laterality: N/A;  . PERIPHERAL VASCULAR CATHETERIZATION N/A 01/02/2016   Procedure: A/V Shuntogram/Fistulagram;  Surgeon: Algernon Huxley, MD;  Location: [place] [facility];  Service:  Cardiovascular;  Laterality: N/A;  . PERIPHERAL VASCULAR CATHETERIZATION N/A 01/02/2016   Procedure: A/V Shunt Intervention;  Surgeon: Algernon Huxley, MD;  Location: [place] [facility];  Service: Cardiovascular;  Laterality: N/A;    SOCIAL HISTORY: Social History   Social History  . Marital status: Divorced     Spouse name: N/A  . Number of children: N/A  . Years of education: N/A   Occupational History  . Not on file.   Social History Main Topics  . Smoking status: Former Smoker    Packs/day: 0.50    Types: Cigarettes    Quit date: 02/13/2015  . Smokeless tobacco: Never Used  . Alcohol use No  . Drug use: No  . Sexual activity: Not on file   Other Topics Concern  . Not on file   Social History Narrative  . No narrative on file    FAMILY HISTORY: Family History  Problem Relation Age of Onset  . Kidney disease Mother   . Diabetes Mother     ALLERGIES:  is allergic to compazine [prochlorperazine edisylate]; ace inhibitors; ativan [lorazepam]; codeine; dilaudid [hydromorphone hcl]; gabapentin; losartan; ondansetron; prochlorperazine; reglan [metoclopramide]; scopolamine; zofran [ondansetron hcl]; oxycodone; and tape.  MEDICATIONS:  Current Facility-Administered Medications  Medication Dose Route Frequency Provider Last Rate Last Dose  . acidophilus (RISAQUAD) capsule 1 capsule  1 capsule Oral Daily Nicholes Mango, MD   1 capsule at 04/21/16 1736  . albuterol (PROVENTIL) (2.5 MG/3ML) 0.083% nebulizer solution 2.5 mg  2.5 mg Inhalation Q4H PRN Nicholes Mango, MD   2.5 mg at 04/23/16 2157  . amLODipine (NORVASC) tablet 10 mg  10 mg Oral Daily Nicholes Mango, MD   10 mg at 04/22/16 1413  . aspirin EC tablet 81 mg  81 mg Oral Prudence Davidson, MD   81 mg at 04/22/16 0848  . atorvastatin (LIPITOR) tablet 20 mg  20 mg Oral QPM Nicholes Mango, MD   20 mg at 04/23/16 1701  . butalbital-acetaminophen-caffeine (FIORICET, ESGIC) 50-325-40 MG per tablet 1-2 tablet  1-2 tablet Oral Q6H PRN Harrie Foreman, MD   2 tablet at 04/23/16 0347  . cefTRIAXone (ROCEPHIN) 1 g in dextrose 5 % 50 mL IVPB  1 g Intravenous Once Delman Kitten, MD   Stopped at 04/21/16 1515  . cefTRIAXone (ROCEPHIN) 1 g in dextrose 5 % 50 mL IVPB  1 g Intravenous Q24H Nicholes Mango, MD   1 g at 04/23/16 1659  . clopidogrel (PLAVIX) tablet  75 mg  75 mg Oral Daily Nicholes Mango, MD   75 mg at 04/23/16 1406  . dextrose 10 % infusion   Intravenous Continuous Loletha Grayer, MD 50 mL/hr at 04/23/16 2019    . FLUoxetine (PROZAC) capsule 20 mg  20 mg Oral Daily Nicholes Mango, MD   20 mg at 04/22/16 1357  . haloperidol lactate (HALDOL) injection 2 mg  2 mg Intravenous Q4H PRN Gonzella Lex, MD   2 mg at 04/23/16 2037  . heparin injection 5,000 Units  5,000 Units Subcutaneous Q12H Nicholes Mango, MD   5,000 Units at 04/23/16 2024  . hydrALAZINE (APRESOLINE) tablet 25 mg  25 mg Oral TID Nicholes Mango, MD   25 mg at 04/23/16 2024  . HYDROcodone-acetaminophen (NORCO/VICODIN) 5-325 MG per tablet 1 tablet  1 tablet Oral Q6H PRN Nicholes Mango, MD   1 tablet at 04/23/16 2248  . ipratropium-albuterol (DUONEB) 0.5-2.5 (3) MG/3ML nebulizer solution 3 mL  3 mL Nebulization Q6H PRN Dustin Flock, MD      .  lipase/protease/amylase (CREON) capsule 24,000 Units  24,000 Units Oral Q lunch Nicholes Mango, MD   24,000 Units at 04/22/16 1356  . lipase/protease/amylase (CREON) capsule 36,000 Units  36,000 Units Oral BID Nicholes Mango, MD   36,000 Units at 04/23/16 1701  . loratadine (CLARITIN) tablet 10 mg  10 mg Oral Daily Nicholes Mango, MD   10 mg at 04/21/16 1736  . morphine 2 MG/ML injection 2 mg  2 mg Intravenous Q4H PRN Lytle Butte, MD   2 mg at 04/23/16 0316  . OLANZapine (ZYPREXA) tablet 5 mg  5 mg Oral QHS Gonzella Lex, MD   5 mg at 04/23/16 2024  . pantoprazole (PROTONIX) EC tablet 40 mg  40 mg Oral BID AC Nicholes Mango, MD   40 mg at 04/23/16 1701  . pregabalin (LYRICA) capsule 25 mg  25 mg Oral Daily Nicholes Mango, MD   25 mg at 04/22/16 1357  . promethazine (PHENERGAN) injection 12.5 mg  12.5 mg Intravenous Q6H Dustin Flock, MD   12.5 mg at 04/24/16 0222  . ranolazine (RANEXA) 12 hr tablet 1,000 mg  1,000 mg Oral BID Nicholes Mango, MD   1,000 mg at 04/22/16 2159  . rifaximin (XIFAXAN) tablet 550 mg  550 mg Oral BID Nicholes Mango, MD   550 mg at 04/23/16 2024  .  sevelamer carbonate (RENVELA) tablet 800 mg  800 mg Oral TID WC Nicholes Mango, MD   800 mg at 04/23/16 1701      .  PHYSICAL EXAMINATION:  Vitals:   04/24/16 0732 04/24/16 0734  BP:  (!) 120/58  Pulse: 64 65  Resp: 16 16  Temp:  97.7 F (36.5 C)   Filed Weights   04/22/16 1110 04/23/16 0915 04/23/16 1257  Weight: 154 lb 15.7 oz (70.3 kg) 160 lb 0.9 oz (72.6 kg) 158 lb 11.7 oz (72 kg)    GENERAL: Well-nourished well-developed; Patient is sleepy. Barely wakes up to verbal commands. EYES: no pallor or icterus OROPHARYNX: no thrush or ulceration. NECK: supple, no masses felt LYMPH:  no palpable lymphadenopathy in the cervical, axillary or inguinal regions LUNGS: decreased breath sounds to auscultation at bases and  No wheeze or crackles HEART/CVS: regular rate & rhythm and no murmurs; No lower extremity edema ABDOMEN: abdomen soft, non-tender and normal bowel sounds Musculoskeletal:no cyanosis of digits and no clubbing  PSYCH: sleepy; barely wakes up to verbal commands.  NEURO: no focal motor/sensory deficits SKIN:  no rashes or significant lesions  LABORATORY DATA:  I have reviewed the data as listed Lab Results  Component Value Date   WBC 6.9 04/21/2016   HGB 9.3 (L) 04/21/2016   HCT 28.0 (L) 04/21/2016   MCV 73.5 (L) 04/21/2016   PLT 254 04/21/2016    Recent Labs  04/08/16 0906 04/08/16 1032 04/09/16 0248 04/11/16 1043 04/21/16 0843 04/21/16 1731  NA  --  134* 133* 131* 132*  --   K  --  3.8 4.3 4.8 4.4  --   CL  --  97* 92* 96* 92*  --   CO2  --  27 31 30 28   --   GLUCOSE  --  83 119* 108* 85  --   BUN  --  18 23* 23* 21*  --   CREATININE  --  4.16* 4.78* 3.88* 4.97* 5.31*  CALCIUM  --  8.3* 8.9 8.4* 9.0  --   GFRNONAA  --  11* 9* 12* 9* 8*  GFRAA  --  13* 11*  14* 10* 10*  PROT 7.7 8.0 9.3*  --  8.4*  --   ALBUMIN 2.9* 3.1* 3.5 2.7* 3.4*  --   AST 36 27 28  --  22  --   ALT 15 16 16   --  11*  --   ALKPHOS 93 100 112  --  102  --   BILITOT 0.5 0.5 0.6   --  0.6  --   BILIDIR 0.2  --   --   --   --   --   IBILI 0.3  --   --   --   --   --     RADIOGRAPHIC STUDIES: I have personally reviewed the radiological images as listed and agreed with the findings in the report. Ct Abdomen Pelvis Wo Contrast  Result Date: 04/21/2016 CLINICAL DATA:  Right-sided abdominal pain. EXAM: CT ABDOMEN AND PELVIS WITHOUT CONTRAST TECHNIQUE: Multidetector CT imaging of the abdomen and pelvis was performed following the standard protocol without IV contrast. COMPARISON:  CT scan of March 15, 2016. FINDINGS: There is continued presence of loculated pleural effusions in both visualized lung bases. No significant osseous abnormality is noted. Status post cholecystectomy. No focal abnormality is noted in the liver, spleen or pancreas on these unenhanced images. Adrenal glands are unremarkable. Bilateral renal atrophy is noted. Extensive atherosclerotic calcifications are seen involving the abdominal aorta and mesenteric and renal arteries. There is no evidence of bowel obstruction. Diverticulosis of sigmoid colon is noted without inflammation. No abnormal fluid collection is noted. Urinary bladder appears normal. Bilateral inguinal adenopathy is noted which appears to be slightly enlarged compared to prior exam, with the largest lymph nodes measuring 1.3 cm bilaterally. Moderate anasarca is noted. IMPRESSION: Continued presence of loculated pleural effusion in both lung bases. Bilateral renal atrophy. Aortic atherosclerosis. Diverticulosis of sigmoid colon without inflammation. Moderate anasarca. Bilateral inguinal adenopathy is noted which is slightly enlarged compared to prior exam. This most likely is inflammatory or reactive in etiology, but neoplasm cannot be excluded. Clinical correlation is recommended. Electronically Signed   By: Marijo Conception, M.D.   On: 04/21/2016 11:51  Dg Chest 2 View  Result Date: 03/27/2016 CLINICAL DATA:  Chest pain EXAM: CHEST  2 VIEW COMPARISON:   02/17/2016 FINDINGS: Cardiac shadow is enlarged. Increased vascular congestion is noted. Bilateral pleural effusions are seen slightly increased from the prior exam. Bilateral vascular stenting in the upper extremities is noted. No acute bony abnormality is seen. IMPRESSION: Bilateral pleural effusions left greater than right increased from the prior exam. Vascular congestion is noted which may be related to the underlying renal failure. Electronically Signed   By: Inez Catalina M.D.   On: 03/27/2016 15:58   Ct Head Wo Contrast  Result Date: 04/08/2016 CLINICAL DATA:  Altered mental status. EXAM: CT HEAD WITHOUT CONTRAST TECHNIQUE: Contiguous axial images were obtained from the base of the skull through the vertex without intravenous contrast. COMPARISON:  CT scan of April 24, 2015. FINDINGS: Bony calvarium appears intact. No mass effect or midline shift is noted. Ventricular size is within normal limits. There is no evidence of mass lesion, hemorrhage or acute infarction. IMPRESSION: Normal head CT. Electronically Signed   By: Marijo Conception, M.D.   On: 04/08/2016 09:05   Mr Brain Wo Contrast  Result Date: 04/22/2016 CLINICAL DATA:  End-stage renal disease patient. Chronic encephalopathy. Nausea, vomiting and right-sided weakness. EXAM: MRI HEAD WITHOUT CONTRAST MRA HEAD WITHOUT CONTRAST TECHNIQUE: Multiplanar, multiecho pulse sequences of the brain  and surrounding structures were obtained without intravenous contrast. Angiographic images of the head were obtained using MRA technique without contrast. COMPARISON:  CT 04/21/2016.  MRI 12/04/2009. FINDINGS: MRI HEAD FINDINGS Diffusion imaging does not show any acute or subacute infarction. There are old small vessel infarctions affecting the pons. No cerebellar insult. Cerebral hemispheres show mild chronic small-vessel ischemic change of the deep white matter. No cortical or large vessel territory infarction. No mass lesion, hemorrhage, hydrocephalus or  extra-axial collection. No pituitary mass. No inflammatory sinus disease. MRA HEAD FINDINGS Motion degraded exam. Both internal carotid arteries are patent into the brain. Siphon regions are not well evaluated because of motion. The anterior and middle cerebral vessels show flow. Both vertebral arteries are patent to the basilar. The basilar is patent. Major posterior circulation branch vessels are patent. IMPRESSION: MRI head: No acute finding. Old small vessel infarctions affecting the pons. Minimal small vessel change affecting the cerebral hemispheric white matter. MRA head: Severely motion degraded study. No major vessel occlusion. Electronically Signed   By: Nelson Chimes M.D.   On: 04/22/2016 10:26   US Carotid Bilateral  Result Date: 04/22/2016 CLINICAL DATA:  TIAs EXAM: BILATERAL CAROTID DUPLEX ULTRASOUND TECHNIQUE: Pearline Cables scale imaging, color Doppler and duplex ultrasound were performed of bilateral carotid and vertebral arteries in the neck. COMPARISON:  None. FINDINGS: Criteria: Quantification of carotid stenosis is based on velocity parameters that correlate the residual internal carotid diameter with NASCET-based stenosis levels, using the diameter of the distal internal carotid lumen as the denominator for stenosis measurement. The following velocity measurements were obtained: RIGHT ICA:  357/67 cm/sec CCA:  [id number] cm/sec SYSTOLIC ICA/CCA RATIO:  5.4 DIASTOLIC ICA/CCA RATIO:  9.4 ECA:  113 cm/sec LEFT ICA:  188/16 cm/sec CCA:  [id number] cm/sec SYSTOLIC ICA/CCA RATIO:  2.4 DIASTOLIC ICA/CCA RATIO:  2.3 ECA:  113 cm/sec RIGHT CAROTID ARTERY: Preliminary grayscale images demonstrate significant calcific plaque in the region of the carotid bulb and proximal internal carotid artery. The waveforms, velocities and flow velocity ratios indicate a high-grade stenosis. RIGHT VERTEBRAL ARTERY:  Antegrade in nature. LEFT CAROTID ARTERY: Preliminary grayscale images demonstrate atherosclerotic plaque in the carotid bulb  and proximal internal carotid artery. The waveforms, velocities and flow velocity ratios show evidence of stenosis in the 50-69% range. LEFT VERTEBRAL ARTERY:  Antegrade in nature. IMPRESSION: Bilateral carotid stenosis much worse on the right than the left as described. Vascular surgery consultation is recommended Electronically Signed   By: Inez Catalina M.D.   On: 04/22/2016 11:22   Dg Chest Portable 1 View  Result Date: 04/07/2016 CLINICAL DATA:  Altered mental status post dialysis today EXAM: PORTABLE CHEST 1 VIEW COMPARISON:  03/27/2016 FINDINGS: Cardiomegaly is noted. Mild interstitial prominence bilateral without convincing pulmonary edema. Bilateral small pleural effusion again noted. Worsening atelectasis or infiltrate in right middle lobe. IMPRESSION: Bilateral small pleural effusion again noted. No convincing pulmonary edema. Worsening atelectasis or infiltrate in right middle lobe. Electronically Signed   By: Lahoma Crocker M.D.   On: 04/07/2016 14:22   Mr Jodene Nam Head/brain [id number] Cm  Result Date: 04/22/2016 CLINICAL DATA:  End-stage renal disease patient. Chronic encephalopathy. Nausea, vomiting and right-sided weakness. EXAM: MRI HEAD WITHOUT CONTRAST MRA HEAD WITHOUT CONTRAST TECHNIQUE: Multiplanar, multiecho pulse sequences of the brain and surrounding structures were obtained without intravenous contrast. Angiographic images of the head were obtained using MRA technique without contrast. COMPARISON:  CT 04/21/2016.  MRI 12/04/2009. FINDINGS: MRI HEAD FINDINGS Diffusion imaging does not show any acute or subacute  infarction. There are old small vessel infarctions affecting the pons. No cerebellar insult. Cerebral hemispheres show mild chronic small-vessel ischemic change of the deep white matter. No cortical or large vessel territory infarction. No mass lesion, hemorrhage, hydrocephalus or extra-axial collection. No pituitary mass. No inflammatory sinus disease. MRA HEAD FINDINGS Motion degraded exam.  Both internal carotid arteries are patent into the brain. Siphon regions are not well evaluated because of motion. The anterior and middle cerebral vessels show flow. Both vertebral arteries are patent to the basilar. The basilar is patent. Major posterior circulation branch vessels are patent. IMPRESSION: MRI head: No acute finding. Old small vessel infarctions affecting the pons. Minimal small vessel change affecting the cerebral hemispheric white matter. MRA head: Severely motion degraded study. No major vessel occlusion. Electronically Signed   By: Nelson Chimes M.D.   On: 04/22/2016 10:26   Ct Head Code Stroke W/o Cm  Result Date: 04/21/2016 CLINICAL DATA:  Right-sided weakness. EXAM: CT HEAD WITHOUT CONTRAST TECHNIQUE: Contiguous axial images were obtained from the base of the skull through the vertex without intravenous contrast. COMPARISON:  04/08/2016 FINDINGS: Normal appearance of the brain. No evidence of acute infarct, hemorrhage, hydrocephalus, or mass lesion. No hyperdense vessel. Calcified atherosclerosis in the carotid and vertebral circulation. Negative skull and visualized orbits. Clear visualized paranasal sinuses. ASPECTS [facility] Stroke Program Early CT Score, [email or web address]) - Ganglionic level infarction (caudate, lentiform nuclei, internal capsule, insula, M1-M3 cortex): 7 - Supraganglionic infarction (M4-M6 cortex): 3 Total score (0-10): 10 These results were called by telephone at the time of interpretation on 04/21/2016 at 8:47 am to Dr. Delman Kitten , who verbally acknowledged these results. IMPRESSION: 1. Negative for hemorrhage or visible infarct. 2. ASPECTS score 10. Electronically Signed   By: Monte Fantasia M.D.   On: 04/21/2016 08:48  US Abdomen Limited Ruq  Result Date: 04/04/2016 CLINICAL DATA:  Right upper quadrant pain for the past 2 months, increased symptoms this morning ; previous cholecystectomy EXAM: US ABDOMEN LIMITED - RIGHT UPPER QUADRANT COMPARISON:   Abdominal and pelvic CT scan of March 15, 2016 FINDINGS: Gallbladder: The gallbladder is surgically absent. There are no abnormal echoes observed in the gallbladder fossa. Common bile duct: Diameter: 8.7 mm Liver: The hepatic echotexture is normal. The surface contour is normal. There is no intrahepatic ductal dilation or focal mass. No ascites is observed. IMPRESSION: Normal limited right upper quadrant ultrasound examination. Electronically Signed   By: David  Martinique M.D.   On: 04/04/2016 08:35    ASSESSMENT & PLAN:   55yo wo with multiple medical problems- with ESRD on HD- and anemia  # Anemia sec to CKD- no evidence of renal masses on recent CT A/P- okay with use of EPO agents as per nephrology. Check Iron studies; ferritin; LDH.   # Bil ingiunal LN- likely reactive; I would not recommend any biopsies this time. Patient poor candidate for any chemotherapies even malignant.  # ESRD/ on HD/ Psychiatric illness  Thank you Dr.Kolluru for allowing me to participate in the care of your patient. Please do not hesitate to contact me with questions or concerns in the interim.  Cammie Sickle, MD 04/24/2016 8:27 AM

## 2016-04-24 NOTE — Evaluation (Signed)
Physical Therapy Evaluation Patient Details Name: [name] MRN: [id number] DOB: [birth year] Today's Date: 04/24/2016   History of Present Illness  presented to ER from dialysis secondary to nausea/vomiting and reports of R-sided weakness; admitted to rule out TIA/CVA.  Imaging negative for acute event; symptoms likely metabolic in nature per notes. Hospital course complicated by labile blood sugars, periods of agitation (requiring haldol at times), and worsening confusion.  Clinical Impression  Upon evaluation, patient lethargic, oriented to self only.  Globally confused with intermittent difficulty following commands.  Speech mumbled/slurred at times; difficulty speaking in full sentences and maintaining topic of conversation at times.  Bilat UE/LE strength grossly at least 4/5, functional for basic transfers and ambulation.  No clinical indicators of pain appreciated.  Currently requiring min assist with RW for all functional activities, with noted deficits in task initiation/sequencing, balance and overall safety.  Generally unsteady and unsafe to complete without assist at this time.  Additional gait distance deferred secondary to patient toileting needs.  CNA at bedside end of session for assist. Would benefit from skilled PT to address above deficits and promote optimal return to PLOF; recommend transition to STR upon discharge from acute hospitalization.     Follow Up Recommendations SNF    Equipment Recommendations       Recommendations for Other Services       Precautions / Restrictions Precautions Precautions: Fall Precaution Comments: L UE AV graft Restrictions Weight Bearing Restrictions: No      Mobility  Bed Mobility Overal bed mobility: Needs Assistance Bed Mobility: Supine to Sit     Supine to sit: Min assist        Transfers Overall transfer level: Needs assistance Equipment used: Rolling walker (2 wheeled) Transfers: Sit to/from Stand Sit to Stand: Min  assist         General transfer comment: requires UE support, increased time to complete  Ambulation/Gait Ambulation/Gait assistance: Min assist Ambulation Distance (Feet): 5 Feet Assistive device: Rolling walker (2 wheeled)       General Gait Details: short, shuffling steps requiring facilitated weight shift from therapist to initiate stepping at times.  Stairs            Wheelchair Mobility    Modified Rankin (Stroke Patients Only)       Balance Overall balance assessment: Needs assistance Sitting-balance support: No upper extremity supported;Feet supported Sitting balance-Leahy Scale: Fair     Standing balance support: Bilateral upper extremity supported Standing balance-Leahy Scale: Fair                               Pertinent Vitals/Pain Pain Assessment: No/denies pain    Home Living Family/patient expects to be discharged to:: Private residence Living Arrangements: Children Available Help at Discharge: Family;Available PRN/intermittently Type of Home: House Home Access: Stairs to enter Entrance Stairs-Rails: Right;Left Entrance Stairs-Number of Steps: 4 Home Layout: Two level;Able to live on main level with bedroom/bathroom Home Equipment: Wheelchair - [employer] - 2 wheels;Cane - single point;Bedside commode      Prior Function Level of Independence: Needs assistance   Gait / Transfers Assistance Needed: Ambulates with RW.     Comments: Festus Aloe (via manual w/c) to HD 3x/week     Hand Dominance        Extremity/Trunk Assessment   Upper Extremity Assessment: Generalized weakness           Lower Extremity Assessment: Generalized weakness (grossly 3+/5 throughout)  Communication   Communication: HOH  Cognition Arousal/Alertness: Lethargic Behavior During Therapy: Impulsive Overall Cognitive Status: Difficult to assess (oriented to self only; inconsistently follows commands. Noted delay in processing;  absent insight/safety awareness.  Speech very mumbled at times; unable to maintain topic of conversation)                      General Comments      Exercises Other Exercises Other Exercises: Toilet transfer, SPT with RW, min assist; standing balance with RW, min assist.  Frequent cuing for recall of task at hand despite being seated on [facility]      Assessment/Plan    PT Assessment Patient needs continued PT services  PT Diagnosis Difficulty walking;Generalized weakness   PT Problem List Decreased activity tolerance;Decreased balance;Decreased mobility;Cardiopulmonary status limiting activity;Decreased strength;Decreased range of motion;Decreased knowledge of use of DME;Decreased safety awareness;Decreased knowledge of precautions  PT Treatment Interventions DME instruction;Gait training;Stair training;Functional mobility training;Therapeutic activities;Therapeutic exercise;Balance training;Patient/family education   PT Goals (Current goals can be found in the Care Plan section) Acute Rehab PT Goals Patient Stated Goal: to go home PT Goal Formulation: With patient Time For Goal Achievement: 04/24/16 Potential to Achieve Goals: Fair    Frequency Min 2X/week   Barriers to discharge Decreased caregiver support      Co-evaluation               End of Session Equipment Utilized During Treatment: Gait belt;Oxygen Activity Tolerance: Patient tolerated treatment well Patient left:  (left on BSC with CNA at bedside) Nurse Communication: Mobility status;Precautions         Time: [id number] PT Time Calculation (min) (ACUTE ONLY): 20 min   Charges:   PT Evaluation $PT Eval Moderate Complexity: 1 Procedure PT Treatments $Therapeutic Activity: 8-22 mins   PT G Codes:        Shelbee Apgar H. Owens Shark, PT, DPT, NCS 04/24/16, 3:43 PM [phone]

## 2016-04-24 NOTE — Progress Notes (Signed)
Pt became verbally aggressive in the evening, refusing medication and treatment. Pt was impulsive, trying to get out of bed multiple times. 2 mg Haldol IV given with no effect. A one time order for 3 mg Haldol IV given with a stat EKG. Patient began to improve, EKG showed nothing abnormal in relation to the medication given. Pt's sugar dropped to 63 in the evening. Pt encouraged to eat, met with resistance but with several staff members encouragement, she ate an orange sorbet and drank some apple juice. The fluids was changed from D5 1/2 NS to D10 at 50 ml/hr. Blood sugar improved to 193. Rechecked blood sugar at 0300, dropped to 61. Pt refuses to eat or drink. An amp of D50 ordered. Continue to assess.

## 2016-04-24 NOTE — Progress Notes (Signed)
OT Cancellation Note  Patient Details Name: SHATERRIA CARRARO MRN: [id number] DOB: [birth year]   Cancelled Treatment:    Reason Eval/Treat Not Completed: Medical issues which prohibited therapy Pt lethargic and continues to have fluctuations in blood sugar with confusion over night into today.  Not appropriate for full evaluation and will continue to assess for readiness for OT evaluation.  Chrys Racer, OTR/L ascom [phone] 04/24/16, 12:20 PM

## 2016-04-24 NOTE — NC FL2 (Signed)
[place] LEVEL OF CARE SCREENING TOOL     IDENTIFICATION  Patient Name: [name] Birthdate: 06-30-1961 Sex: female Admission Date (Current Location): 04/21/2016  [place] and [place] Number:  Engineering geologist and Address:  [facility], [address], [place], [place] [address]      Provider Number: [id number]  Attending Physician Name and Address:  Dustin Flock, MD  Relative Name and Phone Number:       Current Level of Care: Hospital Recommended Level of Care: [place] Prior Approval Number:    Date Approved/Denied:   PASRR Number:  ( [id number] )  Discharge Plan: SNF    Current Diagnoses: Patient Active Problem List   Diagnosis Date Noted  . TIA (transient ischemic attack) 04/21/2016  . Altered mental status 04/08/2016  . Hyperammonemia ([place]) 04/08/2016  . Elevated troponin 04/08/2016  . ESRD (end stage renal disease) ([place]) 04/08/2016  . Depression 04/08/2016  . Depression, major, recurrent, severe with psychosis ([place]) 04/08/2016  . Blood in stool   . Intractable cyclical vomiting with nausea   . Reflux esophagitis   . Gastritis   . Generalized abdominal pain   . Uncontrollable vomiting   . Major depressive disorder, recurrent episode, moderate ([place]) 03/15/2016  . Adjustment disorder with mixed anxiety and depressed mood 03/15/2016  . Malnutrition of moderate degree 12/01/2015  . Renal mass   . Dyspnea   . Acute renal failure ([place])   . Respiratory failure ([place])   . High temperature 11/14/2015  . Pulmonary edema   . Encounter for central line placement   . Encounter for orogastric (OG) tube placement   . Nausea 11/12/2015  . Hyperkalemia 10/03/2015  . C. difficile diarrhea 07/22/2015  . Pneumonia 05/21/2015  . Hypoglycemia 04/24/2015  . Unresponsiveness 04/24/2015  . Bradycardia 04/24/2015  . Hypothermia 04/24/2015  . Acute respiratory failure ([place]) 04/24/2015  . Acute diastolic CHF  (congestive heart failure) ([place]) 04/05/2015  . Diabetic gastroparesis ([place]) 04/05/2015  . Hypokalemia 04/05/2015  . Generalized weakness 04/05/2015  . Acute pulmonary edema ([place]) 04/03/2015  . Nausea and vomiting 04/03/2015  . Hypoglycemia associated with diabetes ([place]) 04/03/2015  . Anemia of chronic disease 04/03/2015  . Secondary hyperparathyroidism ([place]) 04/03/2015  . Pressure ulcer 04/02/2015  . Acute respiratory failure with hypoxia ([place]) 04/01/2015  . Adjustment disorder with anxiety 03/14/2015  . Somatic symptom disorder, mild 03/08/2015  . Coronary artery disease involving native coronary artery of native heart without angina pectoris   . Nausea & vomiting 03/06/2015  . Abdominal pain 03/06/2015  . DM (diabetes mellitus) ([place]) 03/06/2015  . HTN (hypertension) 03/06/2015  . Gastroparesis 02/24/2015  . Pleural effusion 02/19/2015  . HCAP (healthcare-associated pneumonia) 02/19/2015  . End-stage renal disease on hemodialysis ([place]) 02/19/2015    Orientation RESPIRATION BLADDER Height & Weight     Self, Situation, Time, Place  Normal, O2 (2 liters O2) Continent Weight: 158 lb 11.7 oz (72 kg) Height:  5' 5.5" (166.4 cm)  BEHAVIORAL SYMPTOMS/MOOD NEUROLOGICAL BOWEL NUTRITION STATUS   (none)  (none) Continent Diet (renal with fluid restriction)  AMBULATORY STATUS COMMUNICATION OF NEEDS Skin   Limited Assist Verbally Normal                       Personal Care Assistance Level of Assistance  Bathing, Dressing Bathing Assistance: Limited assistance   Dressing Assistance: Limited assistance     Functional Limitations Info  SPECIAL CARE FACTORS FREQUENCY  PT (By licensed PT) (outpatient dialysis)                    Contractures Contractures Info: Not present    Additional Factors Info                  Current Medications (04/24/2016):  This is the current hospital active medication list Current Facility-Administered Medications   Medication Dose Route Frequency Provider Last Rate Last Dose  . acidophilus (RISAQUAD) capsule 1 capsule  1 capsule Oral Daily Nicholes Mango, MD   1 capsule at 04/24/16 1052  . albuterol (PROVENTIL) (2.5 MG/3ML) 0.083% nebulizer solution 2.5 mg  2.5 mg Inhalation Q4H Lavonia Dana, MD   2.5 mg at 04/24/16 1116  . amLODipine (NORVASC) tablet 10 mg  10 mg Oral Daily Nicholes Mango, MD   10 mg at 04/24/16 1052  . aspirin EC tablet 81 mg  81 mg Oral Prudence Davidson, MD   81 mg at 04/24/16 1052  . atorvastatin (LIPITOR) tablet 20 mg  20 mg Oral QPM Nicholes Mango, MD   20 mg at 04/23/16 1701  . butalbital-acetaminophen-caffeine (FIORICET, ESGIC) 50-325-40 MG per tablet 1-2 tablet  1-2 tablet Oral Q6H PRN Harrie Foreman, MD   2 tablet at 04/23/16 0347  . cefUROXime (CEFTIN) tablet 250 mg  250 mg Oral Q24H Dustin Flock, MD      . clopidogrel (PLAVIX) tablet 75 mg  75 mg Oral Daily Nicholes Mango, MD   75 mg at 04/24/16 1052  . dextrose 10 % infusion   Intravenous Continuous Loletha Grayer, MD 50 mL/hr at 04/24/16 1609    . FLUoxetine (PROZAC) capsule 20 mg  20 mg Oral Daily Aruna Gouru, MD   20 mg at 04/24/16 1053  . haloperidol lactate (HALDOL) injection 2 mg  2 mg Intravenous Q4H PRN Gonzella Lex, MD   2 mg at 04/23/16 2037  . heparin injection 5,000 Units  5,000 Units Subcutaneous Q12H Nicholes Mango, MD   5,000 Units at 04/24/16 1053  . hydrALAZINE (APRESOLINE) tablet 25 mg  25 mg Oral TID Nicholes Mango, MD   25 mg at 04/24/16 1611  . HYDROcodone-acetaminophen (NORCO/VICODIN) 5-325 MG per tablet 1 tablet  1 tablet Oral Q6H PRN Nicholes Mango, MD   1 tablet at 04/23/16 2248  . ipratropium-albuterol (DUONEB) 0.5-2.5 (3) MG/3ML nebulizer solution 3 mL  3 mL Nebulization Q6H PRN Dustin Flock, MD      . lipase/protease/amylase (CREON) capsule 24,000 Units  24,000 Units Oral Q lunch Nicholes Mango, MD   24,000 Units at 04/24/16 1308  . lipase/protease/amylase (CREON) capsule 36,000 Units  36,000 Units Oral BID  Nicholes Mango, MD   36,000 Units at 04/24/16 0850  . loratadine (CLARITIN) tablet 10 mg  10 mg Oral Daily Nicholes Mango, MD   10 mg at 04/24/16 1052  . morphine 2 MG/ML injection 2 mg  2 mg Intravenous Q4H PRN Lytle Butte, MD   2 mg at 04/23/16 0316  . OLANZapine (ZYPREXA) tablet 5 mg  5 mg Oral QHS Gonzella Lex, MD   5 mg at 04/23/16 2024  . pantoprazole (PROTONIX) EC tablet 40 mg  40 mg Oral BID AC Nicholes Mango, MD   40 mg at 04/24/16 0850  . pregabalin (LYRICA) capsule 25 mg  25 mg Oral Daily Nicholes Mango, MD   25 mg at 04/24/16 1052  . promethazine (PHENERGAN) injection 12.5 mg  12.5 mg Intravenous  Q6H Dustin Flock, MD   12.5 mg at 04/24/16 1453  . rifaximin (XIFAXAN) tablet 550 mg  550 mg Oral BID Nicholes Mango, MD   550 mg at 04/24/16 1052  . sevelamer carbonate (RENVELA) tablet 800 mg  800 mg Oral TID WC Nicholes Mango, MD   800 mg at 04/24/16 1308     Discharge Medications: Please see discharge summary for a list of discharge medications.  Relevant Imaging Results:  Relevant Lab Results:   Additional Information SS: [id number]  Shela Leff, LCSW

## 2016-04-24 NOTE — Progress Notes (Signed)
[facility] NAME: [name]    MRN#:  [id number]  DATE OF BIRTH:  Mar 04, 1961  SUBJECTIVE:  Hospital Day: 4 days [name] is a 55 y.o. female presenting with Abdominal Pain and Emesis .   Overnight events: Patient continues to have intermittent confusion yesterday was very agitated Blood sugars are labile Underwent EEG yesterday results currently pending  REVIEW OF SYSTEMS:  CONSTITUTIONAL: Unable to provide   DRUG ALLERGIES:   Allergies  Allergen Reactions  . Compazine [Prochlorperazine Edisylate] Anaphylaxis and Nausea And Vomiting    23 Jul - patient relates that she takes promethazine frequently with no problems.  . Ace Inhibitors Swelling  . Ativan [Lorazepam] Other (See Comments)    Reaction:  Hallucinations and headaches  . Codeine Nausea And Vomiting  . Dilaudid [Hydromorphone Hcl] Other (See Comments)    Delirium  . Gabapentin Other (See Comments)    Reaction:  Unknown   . Losartan Other (See Comments)    Reaction:  Unknown   . Ondansetron Other (See Comments)    Reaction:  Unknown   . Prochlorperazine Other (See Comments)    Reaction:  Unknown   . Reglan [Metoclopramide]     Per patient her Dr. Evelina Bucy her off it   . Scopolamine Other (See Comments)    Reaction:  Unknown   . Zofran [Ondansetron Hcl] Other (See Comments)    Reaction:  hallucinations   . Oxycodone Anxiety  . Tape Rash    VITALS:  Blood pressure (!) 128/102, pulse 66, temperature 97.6 F (36.4 C), temperature source Oral, resp. rate 16, height 5' 5.5" (1.664 m), weight 72 kg (158 lb 11.7 oz), SpO2 100 %.  PHYSICAL EXAMINATION:  VITAL SIGNS: Vitals:   04/24/16 0734 04/24/16 1049  BP: (!) 120/58 (!) 128/102  Pulse: 65 66  Resp: 16 16  Temp: 97.7 F (36.5 C) 97.6 F (36.4 C)   GENERAL:54 y.o.female currently in no acute distress.  HEAD: Normocephalic, atraumatic.  EYES: Pupils equal, round, reactive to light. Extraocular  muscles intact. No scleral icterus.  MOUTH: Moist mucosal membrane. Dentition intact. No abscess noted.  EAR, NOSE, THROAT: Clear without exudates. No external lesions.  NECK: Supple. No thyromegaly. No nodules. No JVD.  PULMONARY: Clear to ascultation, without wheeze rails or rhonci. No use of accessory muscles, Good respiratory effort. good air entry bilaterally CHEST: Nontender to palpation.  CARDIOVASCULAR: S1 and S2. Regular rate and rhythm. No murmurs, rubs, or gallops. No edema. Pedal pulses 2+ bilaterally.  GASTROINTESTINAL: Soft, nontender, nondistended. No masses. Positive bowel sounds. No hepatosplenomegaly.  MUSCULOSKELETAL: No swelling, clubbing, or edema. Range of motion full in all extremities.  NEUROLOGIC: Cranial nerves II through XII are intact. No gross focal neurological deficits. Sensation intact. Reflexes intact.  SKIN: No ulceration, lesions, rashes, or cyanosis. Skin warm and dry. Turgor intact.  PSYCHIATRIC: Mood, affect within normal limits. The patient is awake, alert and oriented x 3. Insight, judgment intact. Circumferential speech pattern     LABORATORY PANEL:   CBC  Recent Labs Lab 04/21/16 1731  WBC 6.9  HGB 9.3*  HCT 28.0*  PLT 254   ------------------------------------------------------------------------------------------------------------------  Chemistries   Recent Labs Lab 04/21/16 0843 04/21/16 1731  NA 132*  --   K 4.4  --   CL 92*  --   CO2 28  --   GLUCOSE 85  --   BUN 21*  --   CREATININE 4.97* 5.31*  CALCIUM  9.0  --   AST 22  --   ALT 11*  --   ALKPHOS 102  --   BILITOT 0.6  --    ------------------------------------------------------------------------------------------------------------------  Cardiac Enzymes No results for input(s): TROPONINI in the last 168 hours. ------------------------------------------------------------------------------------------------------------------  RADIOLOGY:  No results found.  EKG:    Orders placed or performed during the hospital encounter of 04/21/16  . ED EKG  . ED EKG  . EKG 12-Lead  . EKG 12-Lead  . EKG 12-Lead  . EKG 12-Lead    ASSESSMENT AND PLAN:   [name] is a 55 y.o. female presenting with Abdominal Pain and Emesis . Admitted 04/21/2016 : Day #: 3 days   #TIA/CVA Neurology input appreciated no further recommendations  #Acute encephalopathy Unclear if patient has had a anoxic or hypoglycemic event causing her to have this encephalopathy EEG is pending I have spoken to neurology who will reevaluate the patient Also history of psychosis continue supportive care and psychiatric follow-up  #Hypoglycemia Patient's blood sugars improved with IV fluids Continue D5 containing fluids Check a random cortisol level  #Acute cystitis with abnormal urinalysis Urine culture with Proteus. Change to oral therapy   #Intractable nausea and vomiting with generalized abdominal pain Continue antiemetics  #End stage renal disease Continue hemodialysis per nephrology.  #Hallucination with history of depression Psychiatry consult appreciated  All the records are reviewed and case discussed with Care Management/Social Workerr. Management plans discussed with the patient, family and they are in agreement.  CODE STATUS: full TOTAL TIME TAKING CARE OF THIS PATIENT: 28 minutes.   POSSIBLE D/C IN 1-2DAYS, DEPENDING ON CLINICAL CONDITION.   Dustin Flock M.D on 04/24/2016 at 12:20 PM  Between 7am to 6pm - Pager - [phone]  After 6pm: House Pager: - [phone]  [facility]  Office  [phone]  CC: Primary care physician; Ellamae Sia, MD

## 2016-04-24 NOTE — Progress Notes (Addendum)
Initial Nutrition Assessment      INTERVENTION:  -Monitor intake and cater to pt preferences -Recommend magic cup BID for added nutrition. Pt does not like nepro per previous admissions.     NUTRITION DIAGNOSIS:   Inadequate oral intake related to lethargy/confusion, poor appetite as evidenced by meal completion < 50%.    GOAL:   Patient will meet greater than or equal to 90% of their needs    MONITOR:   PO intake, Supplement acceptance, Labs  REASON FOR ASSESSMENT:   Consult Poor PO  ASSESSMENT:      Pt admitted with intractable nausea, vomiting, acute cystitis, delusions, confused. Psych following  Past Medical History:  Diagnosis Date  . Anemia   . Anxiety   . Arthritis   . Asthma   . chronic diastolic CHF [id number]  . Collagen vascular disease ([place])   . COPD (chronic obstructive pulmonary disease) ([place])   . Coronary artery disease    a. cath 2013: stenting to RCA (report not available); b. cath 2014: LM nl, pLAD 40%, mLAD nl, ost LCx 40%, mid LCx nl, pRCA 30% @ site of prior stent, mRCA 50%  . Depression   . Diabetes mellitus without complication ([place])   . Diabetic neuropathy ([place])   . dialysis 2006  . ESRD (end stage renal disease) on dialysis ([place])    M-W-F  . GERD (gastroesophageal reflux disease)   . Headache   . Hx of pancreatitis 2015  . Hypertension   . Mitral regurgitation    a. echo 10/2013: EF 62%, noWMA, mildly dilated LA, mild to mod MR/TR, GR1DD  . Myocardial infarction ([place])   . Pneumonia   . Renal insufficiency    Pt confused during visit, asking me if I saw those little children in her room.  Mumbling at times, unable to answer questions.  RN, Gerald Stabs reports appetite is better, ate fairly good lunch per RN.  SLP evaluated pt Per I and O sheet 0-50% of meals.  Medications reviewed: D 10 at 84ml/hr, renvela Labs reviewed: Na 132, BUN 21, creatinine 4.97   Unable to complete Nutrition-Focused physical exam at this time.      Diet Order:  Diet renal with fluid restriction Fluid restriction: 1200 mL Fluid; Room service appropriate? Yes with Assist; Fluid consistency: Thin  Skin:  Reviewed, no issues  Last BM:  8/3  Height:   Ht Readings from Last 1 Encounters:  04/21/16 5' 5.5" (1.664 m)    Weight: reviewed wt encounters (wt gain since April 2017)  Wt Readings from Last 1 Encounters:  04/23/16 158 lb 11.7 oz (72 kg)    Ideal Body Weight:     BMI:  Body mass index is 26.01 kg/m.  Estimated Nutritional Needs:   Kcal:  [id number] kcals/d  Protein:  86-108 g/d  Fluid:  1094ml + UOP  EDUCATION NEEDS:   No education needs identified at this time  Breannah Kratt B. Zenia Resides, [place], [place] (pager) Weekend/On-Call pager [phone])

## 2016-04-25 LAB — RENAL FUNCTION PANEL
Albumin: 2.8 g/dL — ABNORMAL LOW (ref 3.5–5.0)
Anion gap: 9 (ref 5–15)
BUN: 23 mg/dL — ABNORMAL HIGH (ref 6–20)
CO2: 28 mmol/L (ref 22–32)
Calcium: 8.4 mg/dL — ABNORMAL LOW (ref 8.9–10.3)
Chloride: 87 mmol/L — ABNORMAL LOW (ref 101–111)
Creatinine, Ser: 4.77 mg/dL — ABNORMAL HIGH (ref 0.44–1.00)
GFR calc Af Amer: 11 mL/min — ABNORMAL LOW (ref >60)
GFR calc non Af Amer: 9 mL/min — ABNORMAL LOW (ref >60)
Glucose, Bld: 130 mg/dL — ABNORMAL HIGH (ref 65–99)
Phosphorus: 3.1 mg/dL (ref 2.5–4.6)
Potassium: 4.7 mmol/L (ref 3.5–5.1)
Sodium: 124 mmol/L — ABNORMAL LOW (ref 135–145)

## 2016-04-25 LAB — GLUCOSE, CAPILLARY
Glucose-Capillary: 119 mg/dL — ABNORMAL HIGH (ref 65–99)
Glucose-Capillary: 131 mg/dL — ABNORMAL HIGH (ref 65–99)
Glucose-Capillary: 120 mg/dL — ABNORMAL HIGH (ref 65–99)
Glucose-Capillary: 172 mg/dL — ABNORMAL HIGH (ref 65–99)

## 2016-04-25 LAB — CBC
HCT: 21.3 % — ABNORMAL LOW (ref 35.0–47.0)
Hemoglobin: 7.1 g/dL — ABNORMAL LOW (ref 12.0–16.0)
MCH: 24.1 pg — ABNORMAL LOW (ref 26.0–34.0)
MCHC: 33.2 g/dL (ref 32.0–36.0)
MCV: 72.6 fL — ABNORMAL LOW (ref 80.0–100.0)
Platelets: 198 10*3/uL (ref 150–440)
RBC: 2.93 MIL/uL — ABNORMAL LOW (ref 3.80–5.20)
RDW: 22.8 % — ABNORMAL HIGH (ref 11.5–14.5)
WBC: 8.6 10*3/uL (ref 3.6–11.0)

## 2016-04-25 IMAGING — DX DG ABDOMEN 1V
2 series · 2 of 2 positions shown · non-contrast
Comparison: 12/17/2015

CLINICAL DATA: Status post catheter placement

EXAM:
ABDOMEN - 1 VIEW

[abdomen kub (1 of 2)]
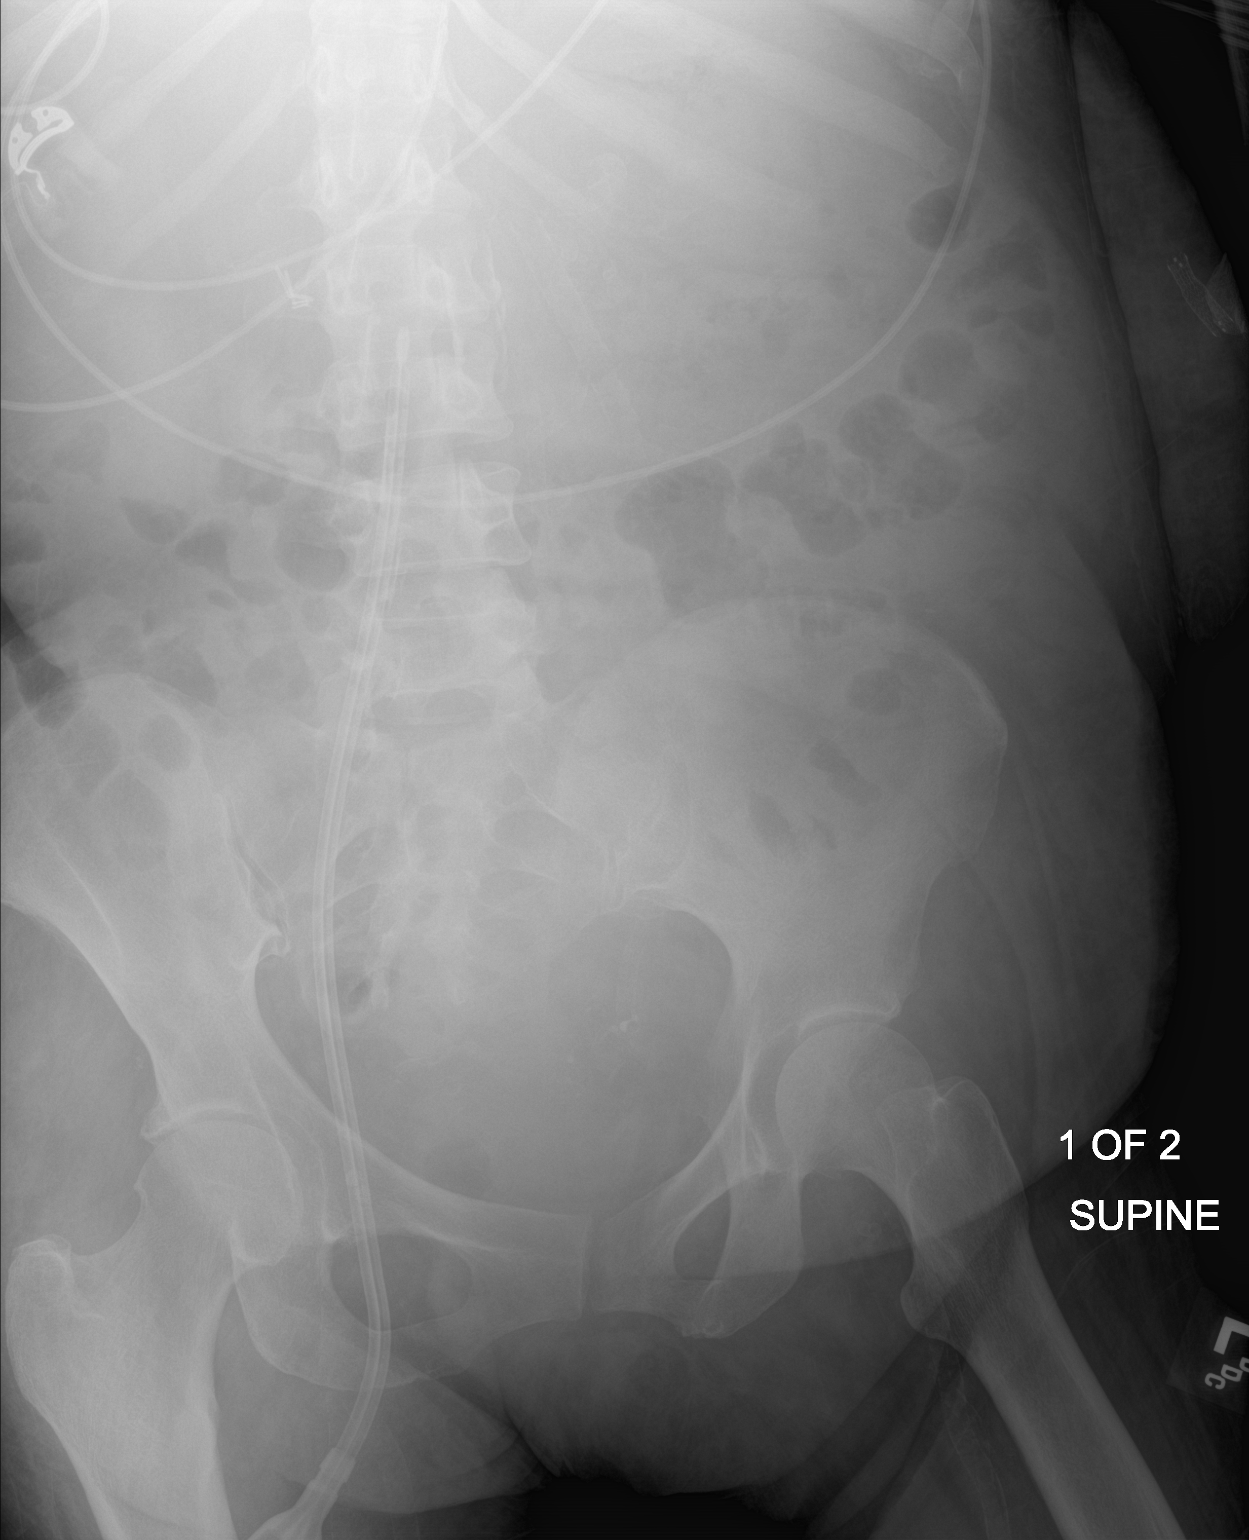

[abdomen kub (2 of 2)]
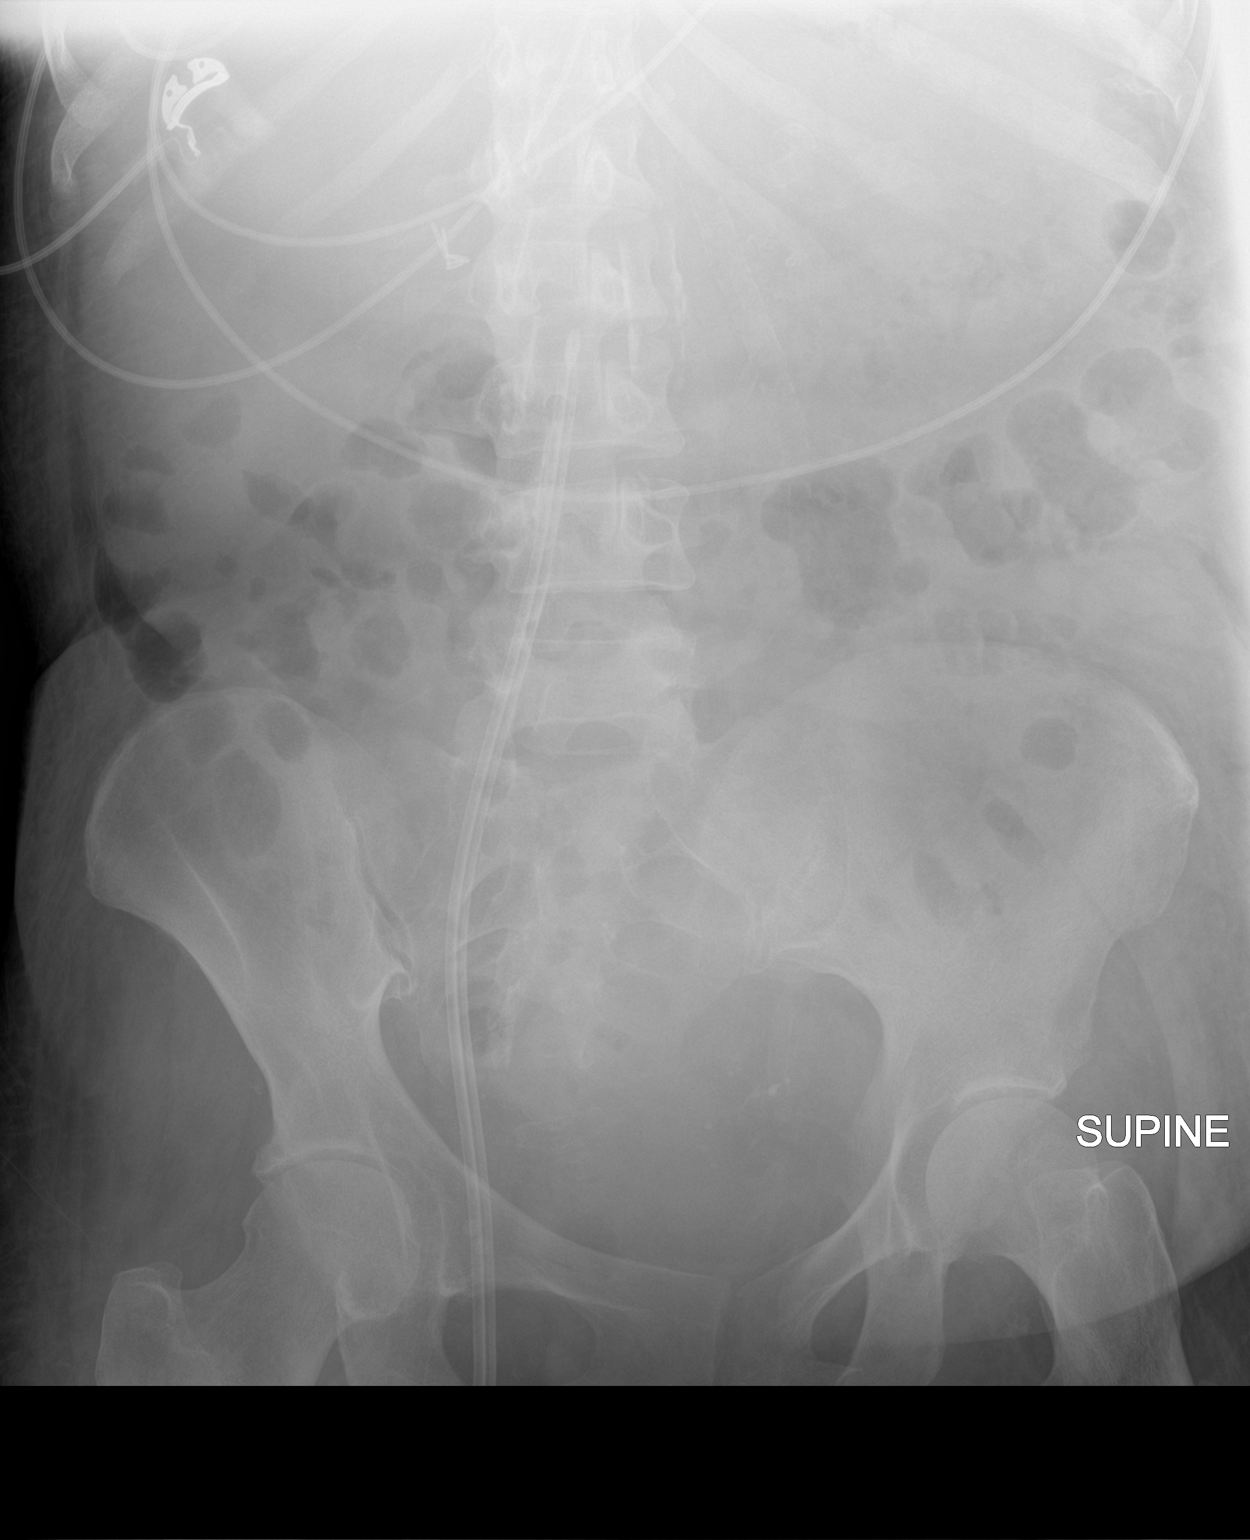

[2 of 2 positions shown; findings below may reference images not displayed]

FINDINGS: Right groin hemodialysis catheter is identified with tip in the
expected location of the IVC. There are cholecystectomy clips noted
in the right upper quadrant of the abdomen. Normal bowel gas
pattern.
IMPRESSION: 1. Tip of the hemodialysis catheter is in the expected location of
the IVC.

## 2016-04-25 MED ORDER — PROMETHAZINE HCL 25 MG/ML IJ SOLN
12.5000 mg | Freq: Four times a day (QID) | INTRAMUSCULAR | Status: DC | PRN
  Administered 2016-04-27 – 2016-04-28 (×2): 12.5 mg via INTRAVENOUS
  Filled 2016-04-25 (×2): qty 1

## 2016-04-25 MED ORDER — GUAIFENESIN-DM 100-10 MG/5ML PO SYRP
5.0000 mL | ORAL_SOLUTION | ORAL | Status: DC | PRN
  Administered 2016-04-25 – 2016-04-27 (×8): 5 mL via ORAL
  Filled 2016-04-25 (×8): qty 5

## 2016-04-25 NOTE — Consult Note (Signed)
[facility] Face-to-Face Psychiatry Consult   Reason for Consult:  Consult follow-up for 55 year old woman with a diagnosis of bipolar disorder but with chronic anxiety and depression symptoms and social difficulties along with multiple severe medical problems Referring Physician:  Hower Patient Identification: [name] MRN:  [id number] Principal Diagnosis: Depression, major, recurrent, severe with psychosis ([place]) Diagnosis:   Patient Active Problem List   Diagnosis Date Noted  . TIA (transient ischemic attack) [G45.9] 04/21/2016  . Altered mental status [R41.82] 04/08/2016  . Hyperammonemia ([place]) [E72.20] 04/08/2016  . Elevated troponin [R79.89] 04/08/2016  . ESRD (end stage renal disease) ([place]) [N18.6] 04/08/2016  . Depression [F32.9] 04/08/2016  . Depression, major, recurrent, severe with psychosis ([place]) [F33.3] 04/08/2016  . Blood in stool [K92.1]   . Intractable cyclical vomiting with nausea [G43.A1]   . Reflux esophagitis [K21.0]   . Gastritis [K29.70]   . Generalized abdominal pain [R10.84]   . Uncontrollable vomiting [R11.10]   . Major depressive disorder, recurrent episode, moderate ([place]) [F33.1] 03/15/2016  . Adjustment disorder with mixed anxiety and depressed mood [F43.23] 03/15/2016  . Malnutrition of moderate degree [E44.0] 12/01/2015  . Renal mass [N28.89]   . Dyspnea [R06.00]   . Acute renal failure ([place]) [N17.9]   . Respiratory failure ([place]) [J96.90]   . High temperature [R50.9] 11/14/2015  . Pulmonary edema [J81.1]   . Encounter for central line placement [Z45.2]   . Encounter for orogastric (OG) tube placement [Z46.59]   . Nausea [R11.0] 11/12/2015  . Hyperkalemia [E87.5] 10/03/2015  . C. difficile diarrhea [A04.7] 07/22/2015  . Pneumonia [J18.9] 05/21/2015  . Hypoglycemia [E16.2] 04/24/2015  . Unresponsiveness [R40.4] 04/24/2015  . Bradycardia [R00.1] 04/24/2015  . Hypothermia [T68.XXXA] 04/24/2015  . Acute respiratory failure ([place]) [J96.00] 04/24/2015  .  Acute diastolic CHF (congestive heart failure) ([place]) [I50.31] 04/05/2015  . Diabetic gastroparesis ([place]) [E11.43, K31.84] 04/05/2015  . Hypokalemia [E87.6] 04/05/2015  . Generalized weakness [R53.1] 04/05/2015  . Acute pulmonary edema ([place]) [J81.0] 04/03/2015  . Nausea and vomiting [R11.2] 04/03/2015  . Hypoglycemia associated with diabetes ([place]) [E11.649] 04/03/2015  . Anemia of chronic disease [D63.8] 04/03/2015  . Secondary hyperparathyroidism ([place]) [N25.81] 04/03/2015  . Pressure ulcer [L89.90] 04/02/2015  . Acute respiratory failure with hypoxia ([place]) [J96.01] 04/01/2015  . Adjustment disorder with anxiety [F43.22] 03/14/2015  . Somatic symptom disorder, mild [F45.8] 03/08/2015  . Coronary artery disease involving native coronary artery of native heart without angina pectoris [I25.10]   . Nausea & vomiting [R11.2] 03/06/2015  . Abdominal pain [R10.9] 03/06/2015  . DM (diabetes mellitus) ([place]) [E11.9] 03/06/2015  . HTN (hypertension) [I10] 03/06/2015  . Gastroparesis [K31.84] 02/24/2015  . Pleural effusion [J90] 02/19/2015  . HCAP (healthcare-associated pneumonia) [J18.9] 02/19/2015  . End-stage renal disease on hemodialysis ([place]) [N18.6, Z99.2] 02/19/2015    Total Time spent with patient: 30 minutes  Subjective:   [name] is a 55 y.o. female patient admitted with "I slept better".   Follow-up on Friday the fourth for this 55 year old woman with multiple medical and psychiatric problems. Patient seen in her room today. She was awake and alert oriented and appropriately interactive. States that she is feeling better. Still has some complaints about her physical situation and gets anxious talking about her family but overall she is her it seems. Says that she slept okay last night. Not complaining of acute psychotic symptoms. Able to interact with her family and interact with treatment appropriately on the unit. HPI:  Follow-up on yesterday's evaluation. Patient says that  she  slept better last night. Got 8 or 9 hours of sleep. They tried to do dialysis today but she had clots in the line and will have to be reevaluated for dialysis tomorrow. Meanwhile she feels like her mood is a little bit more relaxed. Not as anxious. She had a talk with her son which she seems to characterize his having been positive last night. Still having some visual hallucinations but doesn't seem to be distressed by them. No other new complaints no indication of acute dangerousness.  Past Psychiatric History: Long-standing mood problems with a lot of exacerbation when she is medically sick. She had been doing very well when she last left the hospital and then seems to of decompensated pretty soon after going back home. Already seems to be returning to a better baseline no history of actual suicide attempts.  Risk to Self: Is patient at risk for suicide?: No Risk to Others:   Prior Inpatient Therapy:   Prior Outpatient Therapy:    Past Medical History:  Past Medical History:  Diagnosis Date  . Anemia   . Anxiety   . Arthritis   . Asthma   . chronic diastolic CHF 6/83/4196  . Collagen vascular disease ([place])   . COPD (chronic obstructive pulmonary disease) ([place])   . Coronary artery disease    a. cath 2013: stenting to RCA (report not available); b. cath 2014: LM nl, pLAD 40%, mLAD nl, ost LCx 40%, mid LCx nl, pRCA 30% @ site of prior stent, mRCA 50%  . Depression   . Diabetes mellitus without complication ([place])   . Diabetic neuropathy ([place])   . dialysis 2006  . ESRD (end stage renal disease) on dialysis ([place])    M-W-F  . GERD (gastroesophageal reflux disease)   . Headache   . Hx of pancreatitis 2015  . Hypertension   . Mitral regurgitation    a. echo 10/2013: EF 62%, noWMA, mildly dilated LA, mild to mod MR/TR, GR1DD  . Myocardial infarction ([place])   . Pneumonia   . Renal insufficiency     Past Surgical History:  Procedure Laterality Date  . ABDOMINAL HYSTERECTOMY    .  APPENDECTOMY    . CARDIAC CATHETERIZATION Left 07/26/2015   Procedure: Left Heart Cath and Coronary Angiography;  Surgeon: Dionisio David, MD;  Location: [place] [facility];  Service: Cardiovascular;  Laterality: Left;  . CHOLECYSTECTOMY    . DIALYSIS FISTULA CREATION Left    upper arm  . ESOPHAGOGASTRODUODENOSCOPY N/A 03/08/2015   Procedure: ESOPHAGOGASTRODUODENOSCOPY (EGD);  Surgeon: Manya Silvas, MD;  Location: [facility] ENDOSCOPY;  Service: Endoscopy;  Laterality: N/A;  . ESOPHAGOGASTRODUODENOSCOPY (EGD) WITH PROPOFOL N/A 03/18/2016   Procedure: ESOPHAGOGASTRODUODENOSCOPY (EGD) WITH PROPOFOL;  Surgeon: [name], MD;  Location: [facility] ENDOSCOPY;  Service: Endoscopy;  Laterality: N/A;  . EYE SURGERY    . FECAL TRANSPLANT N/A 08/23/2015   Procedure: FECAL TRANSPLANT;  Surgeon: Manya Silvas, MD;  Location[facility] ENDOSCOPY;  Service: Endoscopy;  Laterality: N/A;  . PERIPHERAL VASCULAR CATHETERIZATION N/A 12/20/2015   Procedure: Thrombectomy of dialysis access versus permcath placement;  Surgeon: Algernon Huxley, MD;  Location: [place] [facility];  Service: Cardiovascular;  Laterality: N/A;  . PERIPHERAL VASCULAR CATHETERIZATION N/A 12/20/2015   Procedure: A/V Shunt Intervention;  Surgeon: Algernon Huxley, MD;  Location: [place] [facility];  Service: Cardiovascular;  Laterality: N/A;  . PERIPHERAL VASCULAR CATHETERIZATION N/A 12/20/2015   Procedure: A/V Shuntogram/Fistulagram;  Surgeon: Algernon Huxley, MD;  Location: [place] [facility];  Service: Cardiovascular;  Laterality: N/A;  . PERIPHERAL VASCULAR CATHETERIZATION N/A 01/02/2016   Procedure: A/V Shuntogram/Fistulagram;  Surgeon: Algernon Huxley, MD;  Location: [place] [facility];  Service: Cardiovascular;  Laterality: N/A;  . PERIPHERAL VASCULAR CATHETERIZATION N/A 01/02/2016   Procedure: A/V Shunt Intervention;  Surgeon: Algernon Huxley, MD;  Location: [place] [facility];  Service: Cardiovascular;  Laterality: N/A;   Family History:  Family History   Problem Relation Age of Onset  . Kidney disease Mother   . Diabetes Mother    Family Psychiatric  History: Positive for anxiety Social History:  History  Alcohol Use No     History  Drug Use No    Social History   Social History  . Marital status: Divorced    Spouse name: N/A  . Number of children: N/A  . Years of education: N/A   Social History Main Topics  . Smoking status: Former Smoker    Packs/day: 0.50    Types: Cigarettes    Quit date: 02/13/2015  . Smokeless tobacco: Never Used  . Alcohol use No  . Drug use: No  . Sexual activity: Not Asked   Other Topics Concern  . None   Social History Narrative  . None   Additional Social History:    Allergies:   Allergies  Allergen Reactions  . Compazine [Prochlorperazine Edisylate] Anaphylaxis and Nausea And Vomiting    23 Jul - patient relates that she takes promethazine frequently with no problems.  . Ace Inhibitors Swelling  . Ativan [Lorazepam] Other (See Comments)    Reaction:  Hallucinations and headaches  . Codeine Nausea And Vomiting  . Dilaudid [Hydromorphone Hcl] Other (See Comments)    Delirium  . Gabapentin Other (See Comments)    Reaction:  Unknown   . Losartan Other (See Comments)    Reaction:  Unknown   . Ondansetron Other (See Comments)    Reaction:  Unknown   . Prochlorperazine Other (See Comments)    Reaction:  Unknown   . Reglan [Metoclopramide]     Per patient her Dr. Evelina Bucy her off it   . Scopolamine Other (See Comments)    Reaction:  Unknown   . Zofran [Ondansetron Hcl] Other (See Comments)    Reaction:  hallucinations   . Oxycodone Anxiety  . Tape Rash    Labs:  Results for orders placed or performed during the hospital encounter of 04/21/16 (from the past 48 hour(s))  Glucose, capillary     Status: None   Collection Time: 04/23/16  4:50 PM  Result Value Ref Range   Glucose-Capillary 65 65 - 99 mg/dL  Glucose, capillary     Status: None   Collection Time: 04/23/16  7:51 PM   Result Value Ref Range   Glucose-Capillary 65 65 - 99 mg/dL  Glucose, capillary     Status: None   Collection Time: 04/23/16  9:09 PM  Result Value Ref Range   Glucose-Capillary 78 65 - 99 mg/dL  Glucose, capillary     Status: Abnormal   Collection Time: 04/23/16 10:19 PM  Result Value Ref Range   Glucose-Capillary 63 (L) 65 - 99 mg/dL  Glucose, capillary     Status: Abnormal   Collection Time: 04/23/16 11:05 PM  Result Value Ref Range   Glucose-Capillary 193 (H) 65 - 99 mg/dL  Glucose, capillary     Status: Abnormal   Collection Time: 04/24/16  3:11 AM  Result Value Ref Range  Glucose-Capillary 61 (L) 65 - 99 mg/dL  Glucose, capillary     Status: Abnormal   Collection Time: 04/24/16  3:56 AM  Result Value Ref Range   Glucose-Capillary 157 (H) 65 - 99 mg/dL  Glucose, capillary     Status: None   Collection Time: 04/24/16  7:31 AM  Result Value Ref Range   Glucose-Capillary 86 65 - 99 mg/dL   Comment 1 Notify RN   Glucose, capillary     Status: Abnormal   Collection Time: 04/24/16 12:26 PM  Result Value Ref Range   Glucose-Capillary 118 (H) 65 - 99 mg/dL   Comment 1 Notify RN   Cortisol     Status: None   Collection Time: 04/24/16  2:10 PM  Result Value Ref Range   Cortisol, Plasma 16.8 ug/dL    Comment: (NOTE) AM    6.7 - 22.6 ug/dL PM   <10.0       ug/dL Performed at [facility]   Blood gas, arterial     Status: Abnormal   Collection Time: 04/24/16  4:00 PM  Result Value Ref Range   FIO2 0.28    Delivery systems NASAL CANNULA    pH, Arterial 7.49 (H) 7.350 - 7.450   pCO2 arterial 40 32.0 - 48.0 mmHg   pO2, Arterial 72 (L) 83.0 - 108.0 mmHg   Bicarbonate 30.5 (H) 21.0 - 28.0 mEq/L   Acid-Base Excess 6.6 (H) 0.0 - 3.0 mmol/L   O2 Saturation 95.5 %   Patient temperature 37.0    Collection site RIGHT RADIAL    Sample type ARTERIAL DRAW    Allens test (pass/fail) POSITIVE (A) PASS  Glucose, capillary     Status: Abnormal   Collection Time: 04/24/16   4:28 PM  Result Value Ref Range   Glucose-Capillary 154 (H) 65 - 99 mg/dL   Comment 1 Notify RN   Glucose, capillary     Status: Abnormal   Collection Time: 04/24/16  8:56 PM  Result Value Ref Range   Glucose-Capillary 147 (H) 65 - 99 mg/dL  Iron and TIBC     Status: Abnormal   Collection Time: 04/24/16  9:27 PM  Result Value Ref Range   Iron 9 (L) 28 - 170 ug/dL   TIBC 127 (L) 250 - 450 ug/dL   Saturation Ratios 7 (L) 10.4 - 31.8 %   UIBC 118 ug/dL  Ferritin     Status: Abnormal   Collection Time: 04/24/16  9:27 PM  Result Value Ref Range   Ferritin 871 (H) 11 - 307 ng/mL  Lactate dehydrogenase     Status: None   Collection Time: 04/24/16  9:27 PM  Result Value Ref Range   LDH 150 98 - 192 U/L  Glucose, capillary     Status: Abnormal   Collection Time: 04/25/16  5:11 AM  Result Value Ref Range   Glucose-Capillary 119 (H) 65 - 99 mg/dL  Glucose, capillary     Status: Abnormal   Collection Time: 04/25/16  7:47 AM  Result Value Ref Range   Glucose-Capillary 131 (H) 65 - 99 mg/dL  Renal function panel     Status: Abnormal   Collection Time: 04/25/16  9:30 AM  Result Value Ref Range   Sodium 124 (L) 135 - 145 mmol/L   Potassium 4.7 3.5 - 5.1 mmol/L   Chloride 87 (L) 101 - 111 mmol/L   CO2 28 22 - 32 mmol/L   Glucose, Bld 130 (H) 65 - 99 mg/dL  BUN 23 (H) 6 - 20 mg/dL   Creatinine, Ser 4.77 (H) 0.44 - 1.00 mg/dL   Calcium 8.4 (L) 8.9 - 10.3 mg/dL   Phosphorus 3.1 2.5 - 4.6 mg/dL   Albumin 2.8 (L) 3.5 - 5.0 g/dL   GFR calc non Af Amer 9 (L) >60 mL/min   GFR calc Af Amer 11 (L) >60 mL/min    Comment: (NOTE) The eGFR has been calculated using the CKD EPI equation. This calculation has not been validated in all clinical situations. eGFR's persistently <60 mL/min signify possible Chronic Kidney Disease.    Anion gap 9 5 - 15  CBC     Status: Abnormal   Collection Time: 04/25/16  9:30 AM  Result Value Ref Range   WBC 8.6 3.6 - 11.0 K/uL   RBC 2.93 (L) 3.80 - 5.20 MIL/uL    Hemoglobin 7.1 (L) 12.0 - 16.0 g/dL   HCT 21.3 (L) 35.0 - 47.0 %   MCV 72.6 (L) 80.0 - 100.0 fL   MCH 24.1 (L) 26.0 - 34.0 pg   MCHC 33.2 32.0 - 36.0 g/dL   RDW 22.8 (H) 11.5 - 14.5 %   Platelets 198 150 - 440 K/uL  Glucose, capillary     Status: Abnormal   Collection Time: 04/25/16 12:41 PM  Result Value Ref Range   Glucose-Capillary 120 (H) 65 - 99 mg/dL    Current Facility-Administered Medications  Medication Dose Route Frequency Provider Last Rate Last Dose  . acidophilus (RISAQUAD) capsule 1 capsule  1 capsule Oral Daily Nicholes Mango, MD   1 capsule at 04/24/16 1052  . albuterol (PROVENTIL) (2.5 MG/3ML) 0.083% nebulizer solution 2.5 mg  2.5 mg Inhalation Q4H Lavonia Dana, MD   2.5 mg at 04/25/16 0718  . amLODipine (NORVASC) tablet 10 mg  10 mg Oral Daily Nicholes Mango, MD   10 mg at 04/25/16 1312  . aspirin EC tablet 81 mg  81 mg Oral Prudence Davidson, MD   81 mg at 04/25/16 0854  . atorvastatin (LIPITOR) tablet 20 mg  20 mg Oral QPM Nicholes Mango, MD   20 mg at 04/24/16 1715  . butalbital-acetaminophen-caffeine (FIORICET, ESGIC) 50-325-40 MG per tablet 1-2 tablet  1-2 tablet Oral Q6H PRN Harrie Foreman, MD   2 tablet at 04/23/16 0347  . cefUROXime (CEFTIN) tablet 250 mg  250 mg Oral Q24H Dustin Flock, MD   250 mg at 04/24/16 1715  . clopidogrel (PLAVIX) tablet 75 mg  75 mg Oral Daily Nicholes Mango, MD   75 mg at 04/25/16 1314  . dextrose 10 % infusion   Intravenous Continuous Loletha Grayer, MD 50 mL/hr at 04/25/16 1432    . FLUoxetine (PROZAC) capsule 20 mg  20 mg Oral Daily Nicholes Mango, MD   20 mg at 04/25/16 1313  . haloperidol lactate (HALDOL) injection 2 mg  2 mg Intravenous Q4H PRN Gonzella Lex, MD   2 mg at 04/25/16 0228  . heparin injection 5,000 Units  5,000 Units Subcutaneous Q12H Nicholes Mango, MD   5,000 Units at 04/25/16 1314  . hydrALAZINE (APRESOLINE) tablet 25 mg  25 mg Oral TID Nicholes Mango, MD   25 mg at 04/25/16 1312  . HYDROcodone-acetaminophen  (NORCO/VICODIN) 5-325 MG per tablet 1 tablet  1 tablet Oral Q6H PRN Nicholes Mango, MD   1 tablet at 04/25/16 0850  . ipratropium-albuterol (DUONEB) 0.5-2.5 (3) MG/3ML nebulizer solution 3 mL  3 mL Nebulization Q6H PRN Dustin Flock, MD      .  lipase/protease/amylase (CREON) capsule 24,000 Units  24,000 Units Oral Q lunch Nicholes Mango, MD   24,000 Units at 04/25/16 1313  . lipase/protease/amylase (CREON) capsule 36,000 Units  36,000 Units Oral BID Nicholes Mango, MD   36,000 Units at 04/25/16 0850  . loratadine (CLARITIN) tablet 10 mg  10 mg Oral Daily Nicholes Mango, MD   10 mg at 04/25/16 1313  . morphine 2 MG/ML injection 2 mg  2 mg Intravenous Q4H PRN Lytle Butte, MD   2 mg at 04/23/16 0316  . OLANZapine (ZYPREXA) tablet 5 mg  5 mg Oral QHS Gonzella Lex, MD   5 mg at 04/24/16 2054  . pantoprazole (PROTONIX) EC tablet 40 mg  40 mg Oral BID AC Nicholes Mango, MD   40 mg at 04/25/16 0850  . pregabalin (LYRICA) capsule 25 mg  25 mg Oral Daily Nicholes Mango, MD   25 mg at 04/25/16 1314  . promethazine (PHENERGAN) injection 12.5 mg  12.5 mg Intravenous Q6H PRN Dustin Flock, MD      . rifaximin Doreene Nest) tablet 550 mg  550 mg Oral BID Nicholes Mango, MD   550 mg at 04/25/16 1313  . sevelamer carbonate (RENVELA) tablet 800 mg  800 mg Oral TID WC Nicholes Mango, MD   800 mg at 04/25/16 1313    Musculoskeletal: Strength & Muscle Tone: decreased Gait & Station: unable to stand Patient leans: N/A  Psychiatric Specialty Exam: Physical Exam  Nursing note and vitals reviewed. Constitutional: She appears well-developed and well-nourished.  HENT:  Head: Normocephalic and atraumatic.  Eyes: Conjunctivae are normal. Pupils are equal, round, and reactive to light.  Neck: Normal range of motion.  Cardiovascular: Normal heart sounds.   Respiratory: Effort normal.  GI: Soft.  Musculoskeletal: Normal range of motion.  Neurological: She is alert.  Skin: Skin is warm and dry.  Psychiatric: She has a normal mood and  affect. Judgment normal. Her mood appears not anxious. Her affect is not labile and not inappropriate. Her speech is delayed. Her speech is not tangential. She is slowed. She is not agitated. Thought content is not paranoid and not delusional. Cognition and memory are normal. She expresses no suicidal ideation.    Review of Systems  Constitutional: Positive for malaise/fatigue.  HENT: Negative.   Eyes: Negative.   Respiratory: Negative.   Cardiovascular: Negative.   Gastrointestinal: Negative.   Musculoskeletal: Negative.   Skin: Negative.   Neurological: Negative.   Psychiatric/Behavioral: Negative for depression, hallucinations, memory loss, substance abuse and suicidal ideas. The patient is nervous/anxious. The patient does not have insomnia.     Blood pressure (!) 150/46, pulse 80, temperature 98.1 F (36.7 C), temperature source Oral, resp. rate 18, height 5' 5.5" (1.664 m), weight 73.6 kg (162 lb 4.1 oz), SpO2 100 %.Body mass index is 26.59 kg/m.  General Appearance: Casual  Eye Contact:  Fair  Speech:  Slow  Volume:  Decreased  Mood:  Euthymic  Affect:  Constricted  Thought Process:  Goal Directed  Orientation:  Full (Time, Place, and Person)  Thought Content:  Logical  Suicidal Thoughts:  No  Homicidal Thoughts:  No  Memory:  Immediate;   Good Recent;   Fair Remote;   Fair  Judgement:  Fair  Insight:  Fair  Psychomotor Activity:  Decreased  Concentration:  Concentration: Fair  Recall:  [employer] of Knowledge:  Fair  Language:  Fair  Akathisia:  No  Handed:  Right  AIMS (if indicated):  Assets:  Communication Skills Desire for Improvement Financial Resources/Insurance Housing Resilience  ADL's:  Intact  Cognition:  WNL  Sleep:        Treatment Plan Summary: Medication management and Plan No longer delirious. Patient gets delirious and confused and agitated when she is physically sick. When her health improves she usually gets more clear mentally and  vice versa. She is tolerating medicine well. Not complaining of depression. Has some optimism about going home. No change to her psychiatric medicine. Supportive counseling completed. I will see her after the weekend if she is still here.  Disposition: Patient does not meet criteria for psychiatric inpatient admission. Supportive therapy provided about ongoing stressors.  Alethia Berthold, MD 04/25/2016 3:43 PM

## 2016-04-25 NOTE — Care Management Important Message (Signed)
Important Message  Patient Details  Name: [name] MRN: [id number] Date of Birth: 06-10-1961   Medicare Important Message Given:  Yes    Shelbie Ammons, RN 04/25/2016, 9:24 AM

## 2016-04-25 NOTE — Progress Notes (Signed)
PRE HD   

## 2016-04-25 NOTE — Progress Notes (Signed)
PT Cancellation Note  Patient Details Name: JOELI MASTELLONE MRN: [id number] DOB: 1961-02-23   Cancelled Treatment:    Reason Eval/Treat Not Completed: Patient at procedure or test/unavailable (Patient currently off unit for dialysis.  Will re-attempt treatment session at later time/date as patient available and medically appropriate.)   Luma Clopper H. Owens Shark, PT, DPT, NCS 04/25/16, 9:49 AM [phone]

## 2016-04-25 NOTE — Progress Notes (Signed)
CSW spoke to patient's son over the phone. He reports that he would be interested in SNF placement for patient but requested to see CSW when he arrives to [facility] later today. CSW ill continue to follow and assist.  Ernest Pine, MSW, LCSW, [place] Social Worker [phone]

## 2016-04-25 NOTE — Clinical Social Work Note (Addendum)
Clinical Social Work Assessment  Patient Details  Name: [name] MRN: [id number] Date of Birth: 1961-05-30  Date of referral:  04/25/16               Reason for consult:  Discharge Planning                Permission sought to share information with:    Permission granted to share information::     Name::        Agency::     Relationship::   [name]- Son)  Contact Information:     Housing/Transportation Living arrangements for the past 2 months:  Single Family Home (Lives with son [name]) Source of Information:  Adult Children [name]- Son) Patient Interpreter Needed:  None Criminal Activity/Legal Involvement Pertinent to Current Situation/Hospitalization:  No - Comment as needed Significant Relationships:  Adult Children [name]) Lives with:  Adult Children Do you feel safe going back to the place where you live?    Need for family participation in patient care:  Yes (Comment)  Care giving concerns:  PT recommended SNF placement for patient.    Social Worker assessment / plan:  CSW attempted to speak to patient. She was not in the room but patient's son- [name] was. CSW introduced herself and her role. Per [name] patient lives with him and his teenage daughter. He reports that patient has declined a lot since she discharged home from [facility] earlier this year. Reported she was walking fined when she discharged from [facility]. Stated that he needs assistance caring for patient during the night because he works 3rd shift. CSW suggested private care sitters he reported he could not afford this service.Stated patient stayed at [facility] for 20 days before she was discharged. Reported that he feels she needs to go back but reports that they cannot afford the co-pay. Stated they'd like to speak to someone at [facility] if possible about a payment arrangement. Reported patient has [facility] at home currently. Granted CSW verbal permission  to send SNF referral to SNFs in [place]. Awaiting bed offers. FL2/ PASRR completed, placed in MD's basket and faxed to SNFs in [place].   Employment status:  Retired Forensic scientist:  Medicare PT Recommendations:  [address] / Referral to community resources:  [place]  Patient/Family's Response to care:  Patient's son is in agreement for patient to go to SNF at discharge. Reported he'd like to speak to them [place] to discuss payment options.   Patient/Family's Understanding of and Emotional Response to Diagnosis, Current Treatment, and Prognosis: Patient's son reports he somewhat understands patient's Diagnosis, Current Treatment, and Prognosis. Reported that patient becomes frustrated with him because she believes she's still independent. Thanked CSW for her assistnace.  Emotional Assessment Appearance:  Other (Comment Required (Unable to assess) Attitude/Demeanor/Rapport:  Unable to Assess Affect (typically observed):  Unable to Assess Orientation:  Oriented to Self Alcohol / Substance use:  Not Applicable Psych involvement (Current and /or in the community):  No (Comment)  Discharge Needs  Concerns to be addressed:  Discharge Planning Concerns Readmission within the last 30 days:  No Current discharge risk:  Chronically ill Barriers to Discharge:  Continued Medical Work up   [employer], LCSW 04/25/2016, 4:12 PM

## 2016-04-25 NOTE — Progress Notes (Signed)
[name] at [place] was admitted to the Hospital on 04/21/2016 and and is still in the hospital.  Her daughter came to visit her on 04/25/2016. Please excuse her from work for this day.  Call Dustin Flock MD with questions.  Dustin Flock M.D on 04/25/2016,at 2:07 PM  [place] at [facility]  [phone]

## 2016-04-25 NOTE — Progress Notes (Signed)
OT Cancellation Note  Patient Details Name: [name] MRN: [id number] DOB: 07-30-1961   Cancelled Treatment:    Reason Eval/Treat Not Completed: Patient at procedure or test/ unavailable (Pt. at Dialysis)   Harrel Carina, MS, OTR/L   Harrel Carina 04/25/2016, 10:01 AM

## 2016-04-25 NOTE — Progress Notes (Signed)
POST HD 

## 2016-04-25 NOTE — Plan of Care (Signed)
Problem: Education: Goal: Knowledge of disease or condition will improve Outcome: Not Progressing Patient with intermittent confusion. Repeat education and safety reinforcement this shift.

## 2016-04-25 NOTE — Progress Notes (Signed)
PT Cancellation Note  Patient Details Name: [name] MRN: [id number] DOB: Oct 24, 1960   Cancelled Treatment:    Reason Eval/Treat Not Completed: Other (comment) (Treatment session attempted.  Patient currently eating lunch upon return from dialysis.  Will re-attempt at later time/date as patient available and medically appropriate.)    Zenaya Ulatowski H. Owens Shark, PT, DPT, NCS 04/25/16, 2:09 PM [phone]

## 2016-04-25 NOTE — Progress Notes (Signed)
[facility]  ROUNDING NOTE   Subjective:   Seen and examined on hemodialysis.  Intermittently confused.    Objective:  Vital signs in last 24 hours:  Temp:  [97.8 F (36.6 C)-99.3 F (37.4 C)] 98.5 F (36.9 C) (08/04 0919) Pulse Rate:  [61-75] 69 (08/04 1130) Resp:  [14-26] 19 (08/04 1130) BP: (126-156)/(40-74) 136/51 (08/04 1130) SpO2:  [96 %-100 %] 100 % (08/04 1130) Weight:  [75.1 kg (165 lb 9.1 oz)] 75.1 kg (165 lb 9.1 oz) (08/04 0919)  Weight change:  Filed Weights   04/23/16 0915 04/23/16 1257 04/25/16 0919  Weight: 72.6 kg (160 lb 0.9 oz) 72 kg (158 lb 11.7 oz) 75.1 kg (165 lb 9.1 oz)    Intake/Output: I/O last 3 completed shifts: In: 960 [P.O.:960] Out: -    Intake/Output this shift:  No intake/output data recorded.  Physical Exam: General: NAD, laying in bed  Head: Normocephalic, atraumatic. Moist oral mucosal membranes  Eyes: Anicteric, PERRL  Neck: Supple, trachea midline  Lungs:  Clear to auscultation  Heart: Regular rate and rhythm  Abdomen:  Soft, nontender  Extremities: no peripheral edema.  Neurologic: Nonfocal, moving all four extremities  Skin: No lesions  Access: Left AVF     Basic Metabolic Panel:  Recent Labs Lab 04/21/16 0843 04/21/16 1731 04/25/16 0930  NA 132*  --  124*  K 4.4  --  4.7  CL 92*  --  87*  CO2 28  --  28  GLUCOSE 85  --  130*  BUN 21*  --  23*  CREATININE 4.97* 5.31* 4.77*  CALCIUM 9.0  --  8.4*  PHOS  --   --  3.1    Liver Function Tests:  Recent Labs Lab 04/21/16 0843 04/25/16 0930  AST 22  --   ALT 11*  --   ALKPHOS 102  --   BILITOT 0.6  --   PROT 8.4*  --   ALBUMIN 3.4* 2.8*    Recent Labs Lab 04/21/16 0843  LIPASE <10*    Recent Labs Lab 04/21/16 0843 04/23/16 1459  AMMONIA 27 39*    CBC:  Recent Labs Lab 04/21/16 0843 04/21/16 1731 04/25/16 0930  WBC 7.1 6.9 8.6  NEUTROABS 4.3  --   --   HGB 9.3* 9.3* 7.1*  HCT 28.3* 28.0* 21.3*  MCV 74.2* 73.5* 72.6*   PLT 257 254 198    Cardiac Enzymes: No results for input(s): CKTOTAL, CKMB, CKMBINDEX, TROPONINI in the last 168 hours.  BNP: Invalid input(s): POCBNP  CBG:  Recent Labs Lab 04/24/16 1226 04/24/16 1628 04/24/16 2056 04/25/16 0511 04/25/16 0747  GLUCAP 118* 154* 147* 119* 131*    Microbiology: Results for orders placed or performed during the hospital encounter of 04/21/16  Urine culture     Status: Abnormal   Collection Time: 04/21/16  9:19 AM  Result Value Ref Range Status   Specimen Description URINE, CLEAN CATCH  Final   Special Requests Normal  Final   Culture >=100,000 COLONIES/mL PROTEUS MIRABILIS (A)  Final   Report Status 04/23/2016 FINAL  Final   Organism ID, Bacteria PROTEUS MIRABILIS (A)  Final      Susceptibility   Proteus mirabilis - MIC*    AMPICILLIN <=2 SENSITIVE Sensitive     CEFAZOLIN 8 SENSITIVE Sensitive     CEFTRIAXONE <=1 SENSITIVE Sensitive     CIPROFLOXACIN <=0.25 SENSITIVE Sensitive     GENTAMICIN <=1 SENSITIVE Sensitive     IMIPENEM 4 SENSITIVE Sensitive  NITROFURANTOIN 256 RESISTANT Resistant     TRIMETH/SULFA <=20 SENSITIVE Sensitive     AMPICILLIN/SULBACTAM <=2 SENSITIVE Sensitive     PIP/TAZO <=4 SENSITIVE Sensitive     * >=100,000 COLONIES/mL PROTEUS MIRABILIS  MRSA PCR Screening     Status: None   Collection Time: 04/21/16  6:07 PM  Result Value Ref Range Status   MRSA by PCR NEGATIVE NEGATIVE Final    Comment:        The GeneXpert MRSA Assay (FDA approved for NASAL specimens only), is one component of a comprehensive MRSA colonization surveillance program. It is not intended to diagnose MRSA infection nor to guide or monitor treatment for MRSA infections.     Coagulation Studies: No results for input(s): LABPROT, INR in the last 72 hours.  Urinalysis: No results for input(s): COLORURINE, LABSPEC, PHURINE, GLUCOSEU, HGBUR, BILIRUBINUR, KETONESUR, PROTEINUR, UROBILINOGEN, NITRITE, LEUKOCYTESUR in the last 72  hours.  Invalid input(s): APPERANCEUR    Imaging: No results found.   Medications:   . dextrose 50 mL/hr at 04/24/16 1609   . acidophilus  1 capsule Oral Daily  . albuterol  2.5 mg Inhalation Q4H  . amLODipine  10 mg Oral Daily  . aspirin EC  81 mg Oral BH-q7a  . atorvastatin  20 mg Oral QPM  . cefUROXime  250 mg Oral Q24H  . clopidogrel  75 mg Oral Daily  . FLUoxetine  20 mg Oral Daily  . heparin subcutaneous  5,000 Units Subcutaneous Q12H  . hydrALAZINE  25 mg Oral TID  . lipase/protease/amylase  24,000 Units Oral Q lunch  . lipase/protease/amylase  36,000 Units Oral BID  . loratadine  10 mg Oral Daily  . OLANZapine  5 mg Oral QHS  . pantoprazole  40 mg Oral BID AC  . pregabalin  25 mg Oral Daily  . promethazine  12.5 mg Intravenous Q6H  . rifaximin  550 mg Oral BID  . sevelamer carbonate  800 mg Oral TID WC   butalbital-acetaminophen-caffeine, haloperidol lactate, HYDROcodone-acetaminophen, ipratropium-albuterol, morphine injection  Assessment/ Plan:  [name] is a 55 y.o. black female  with insulin dependent diabetes mellitus type II, hypertension, hyperlipidemia, diabetic retinopathy, diabetic neuropathy, diabetic gastroparesis, pancreatic insufficiency, depression, GERD. Renal masses - avoiding epo   MWF [facility] [address]   1. End Stage Renal Disease: seen and examined on hemodialysis. Tolerating treatment well.  - Continue MWF schedule  2. Anemia of chronic kidney disease: Not getting erythropoeitin stimulating agent as outpatient due to renal masses. However CT from 6/24 does not seem to show any renal masses.  - Consulted hematology: okay to use epo.  3. Secondary Hyperparathyroidism:  - Continue sevelamer  4. Hypertension: at goal.   - amlodipine and hydralazine.   5. Encephalopathy: appreciate psych and neurology input. CT head on 7/18 negative.  - olanzapine   LOS: 4 Christie Copley 8/4/201712:00 PM

## 2016-04-25 NOTE — Progress Notes (Signed)
Pre Dialysis 

## 2016-04-25 NOTE — Progress Notes (Signed)
HD TX START

## 2016-04-25 NOTE — Progress Notes (Signed)
[place] at [place] NAME: [name]    MRN#:  [id number]  DATE OF BIRTH:  [birth year]  SUBJECTIVE:  Hospital Day: 4 days [name] is a 55 y.o. female presenting with Abdominal Pain and Emesis .   Overnight events:  Was very agitated yesterday had to receive haloperidol Currently seen in hemodialysis sleepy and was not agitated earlier Blood sugar is improved patient was on Ranexa which has been discontinued    REVIEW OF SYSTEMS:  CONSTITUTIONAL: Unable to provide   DRUG ALLERGIES:   Allergies  Allergen Reactions  . Compazine [Prochlorperazine Edisylate] Anaphylaxis and Nausea And Vomiting    23 Jul - patient relates that she takes promethazine frequently with no problems.  . Ace Inhibitors Swelling  . Ativan [Lorazepam] Other (See Comments)    Reaction:  Hallucinations and headaches  . Codeine Nausea And Vomiting  . Dilaudid [Hydromorphone Hcl] Other (See Comments)    Delirium  . Gabapentin Other (See Comments)    Reaction:  Unknown   . Losartan Other (See Comments)    Reaction:  Unknown   . Ondansetron Other (See Comments)    Reaction:  Unknown   . Prochlorperazine Other (See Comments)    Reaction:  Unknown   . Reglan [Metoclopramide]     Per patient her Dr. Evelina Bucy her off it   . Scopolamine Other (See Comments)    Reaction:  Unknown   . Zofran [Ondansetron Hcl] Other (See Comments)    Reaction:  hallucinations   . Oxycodone Anxiety  . Tape Rash    VITALS:  Blood pressure (!) 134/55, pulse 72, temperature 97.9 F (36.6 C), temperature source Axillary, resp. rate 18, height 5' 5.5" (1.664 m), weight 73.6 kg (162 lb 4.1 oz), SpO2 100 %.  PHYSICAL EXAMINATION:  VITAL SIGNS: Vitals:   04/25/16 1230 04/25/16 1244  BP: (!) 141/50 (!) 134/55  Pulse: 70 72  Resp: 17 18  Temp: 97.9 F (36.6 C)    GENERAL:54 y.o.female currently in no acute distress.  HEAD: Normocephalic, atraumatic.  EYES: Pupils equal, round,  reactive to light. Extraocular muscles intact. No scleral icterus.  MOUTH: Moist mucosal membrane. Dentition intact. No abscess noted.  EAR, NOSE, THROAT: Clear without exudates. No external lesions.  NECK: Supple. No thyromegaly. No nodules. No JVD.  PULMONARY: Clear to ascultation, without wheeze rails or rhonci. No use of accessory muscles, Good respiratory effort. good air entry bilaterally CHEST: Nontender to palpation.  CARDIOVASCULAR: S1 and S2. Regular rate and rhythm. No murmurs, rubs, or gallops. No edema. Pedal pulses 2+ bilaterally.  GASTROINTESTINAL: Soft, nontender, nondistended. No masses. Positive bowel sounds. No hepatosplenomegaly.  MUSCULOSKELETAL: No swelling, clubbing, or edema. Range of motion full in all extremities.  NEUROLOGIC: Cranial nerves II through XII are intact. No gross focal neurological deficits. Sensation intact. Reflexes intact.  SKIN: No ulceration, lesions, rashes, or cyanosis. Skin warm and dry. Turgor intact.  PSYCHIATRIC: Currently awake not agitated   LABORATORY PANEL:   CBC  Recent Labs Lab 04/25/16 0930  WBC 8.6  HGB 7.1*  HCT 21.3*  PLT 198   ------------------------------------------------------------------------------------------------------------------  Chemistries   Recent Labs Lab 04/21/16 0843  04/25/16 0930  NA 132*  --  124*  K 4.4  --  4.7  CL 92*  --  87*  CO2 28  --  28  GLUCOSE 85  --  130*  BUN 21*  --  23*  CREATININE 4.97*  < > 4.77*  CALCIUM 9.0  --  8.4*  AST 22  --   --   ALT 11*  --   --   ALKPHOS 102  --   --   BILITOT 0.6  --   --   < > = values in this interval not displayed. ------------------------------------------------------------------------------------------------------------------  Cardiac Enzymes No results for input(s): TROPONINI in the last 168 hours. ------------------------------------------------------------------------------------------------------------------  RADIOLOGY:  No  results found.  EKG:   Orders placed or performed during the hospital encounter of 04/21/16  . ED EKG  . ED EKG  . EKG 12-Lead  . EKG 12-Lead  . EKG 12-Lead  . EKG 12-Lead    ASSESSMENT AND PLAN:   [name] is a 55 y.o. female presenting with Abdominal Pain and Emesis . Admitted 04/21/2016 : Day #: 3 days   #TIA/CVA Neurology input appreciated no further recommendations  #Acute encephalopathy I suspect this is multifactorial due to combination of hypoglycemia, hypoxia, psychosis EEG normal We'll continue supportive care  #Hypoglycemia Patient's blood sugars improved with IV fluids Continue D5 containing fluids Patient was on Ranexa which pharmacy stated that may have contributed to this site is been discontinued  #Acute cystitis with abnormal urinalysis Continue oral Ceftin   #Intractable nausea and vomiting with generalized abdominal pain Continue antiemetics  #End stage renal disease Continue hemodialysis per nephrology.  #Hallucination with history of depression Psychiatry consult appreciated  All the records are reviewed and case discussed with Care Management/Social Workerr. Management plans discussed with the patient, family and they are in agreement.  CODE STATUS: full TOTAL TIME TAKING CARE OF THIS PATIENT: 28 minutes.   Likely monitor over the weekend Case discussed with the son   Dustin Flock M.D on 04/25/2016 at 1:19 PM  Between 7am to 6pm - Pager - [phone]  After 6pm: House Pager: - [phone]  [facility]  Office  [phone]  CC: Primary care physician; Ellamae Sia, MD

## 2016-04-25 NOTE — Progress Notes (Signed)
POST HD VITALS

## 2016-04-25 NOTE — Progress Notes (Signed)
POST HD ASSESSMENT

## 2016-04-26 ENCOUNTER — Inpatient Hospital Stay: Payer: Medicare Other

## 2016-04-26 LAB — BASIC METABOLIC PANEL
Anion gap: 10 (ref 5–15)
BUN: 12 mg/dL (ref 6–20)
CO2: 29 mmol/L (ref 22–32)
Calcium: 8.1 mg/dL — ABNORMAL LOW (ref 8.9–10.3)
Chloride: 90 mmol/L — ABNORMAL LOW (ref 101–111)
Creatinine, Ser: 3.27 mg/dL — ABNORMAL HIGH (ref 0.44–1.00)
GFR calc Af Amer: 17 mL/min — ABNORMAL LOW (ref >60)
GFR calc non Af Amer: 15 mL/min — ABNORMAL LOW (ref >60)
Glucose, Bld: 121 mg/dL — ABNORMAL HIGH (ref 65–99)
Potassium: 4.3 mmol/L (ref 3.5–5.1)
Sodium: 129 mmol/L — ABNORMAL LOW (ref 135–145)

## 2016-04-26 LAB — HEMOGLOBIN AND HEMATOCRIT, BLOOD
HCT: 25.2 % — ABNORMAL LOW (ref 35.0–47.0)
Hemoglobin: 8.3 g/dL — ABNORMAL LOW (ref 12.0–16.0)

## 2016-04-26 LAB — CBC
HCT: 20.2 % — ABNORMAL LOW (ref 35.0–47.0)
Hemoglobin: 6.7 g/dL — ABNORMAL LOW (ref 12.0–16.0)
MCH: 24.6 pg — ABNORMAL LOW (ref 26.0–34.0)
MCHC: 33.1 g/dL (ref 32.0–36.0)
MCV: 74.1 fL — ABNORMAL LOW (ref 80.0–100.0)
Platelets: 199 10*3/uL (ref 150–440)
RBC: 2.72 MIL/uL — ABNORMAL LOW (ref 3.80–5.20)
RDW: 23 % — ABNORMAL HIGH (ref 11.5–14.5)
WBC: 6.5 10*3/uL (ref 3.6–11.0)

## 2016-04-26 LAB — GLUCOSE, CAPILLARY
Glucose-Capillary: 141 mg/dL — ABNORMAL HIGH (ref 65–99)
Glucose-Capillary: 124 mg/dL — ABNORMAL HIGH (ref 65–99)
Glucose-Capillary: 144 mg/dL — ABNORMAL HIGH (ref 65–99)
Glucose-Capillary: 208 mg/dL — ABNORMAL HIGH (ref 65–99)
Glucose-Capillary: 165 mg/dL — ABNORMAL HIGH (ref 65–99)
Glucose-Capillary: 156 mg/dL — ABNORMAL HIGH (ref 65–99)

## 2016-04-26 LAB — LIPASE, BLOOD: Lipase: 10 U/L — ABNORMAL LOW (ref 11–51)

## 2016-04-26 LAB — ABO/RH: ABO/RH(D): O POS

## 2016-04-26 LAB — PREPARE RBC (CROSSMATCH)

## 2016-04-26 LAB — BETA-HYDROXYBUTYRIC ACID: Beta-Hydroxybutyric Acid: <0.05 mmol/L — ABNORMAL LOW (ref 0.05–0.27)

## 2016-04-26 IMAGING — CR DG ABDOMEN ACUTE W/ 1V CHEST
3 series · 3 of 3 positions shown · non-contrast
Comparison: 12/17/2015 and 12/19/2015

CLINICAL DATA: Abdominal pain

EXAM:
DG ABDOMEN ACUTE W/ 1V CHEST

[chest pa]
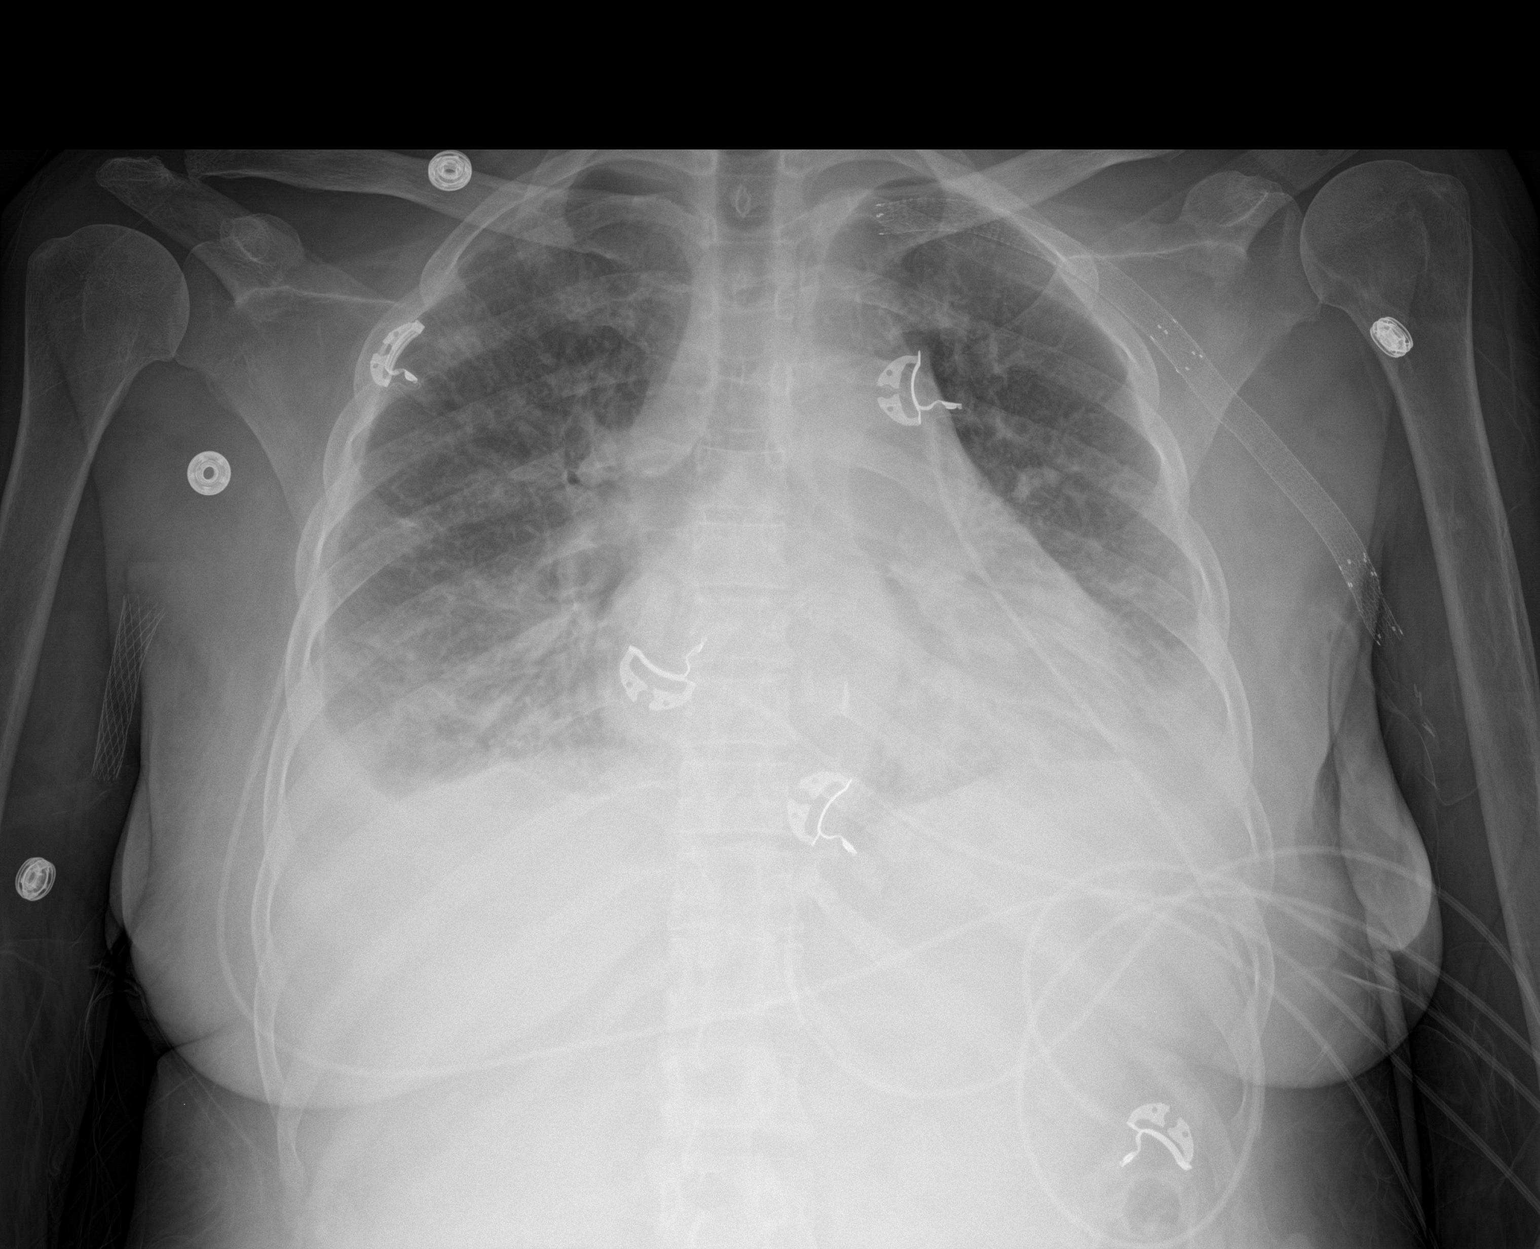

[abdomen erect]
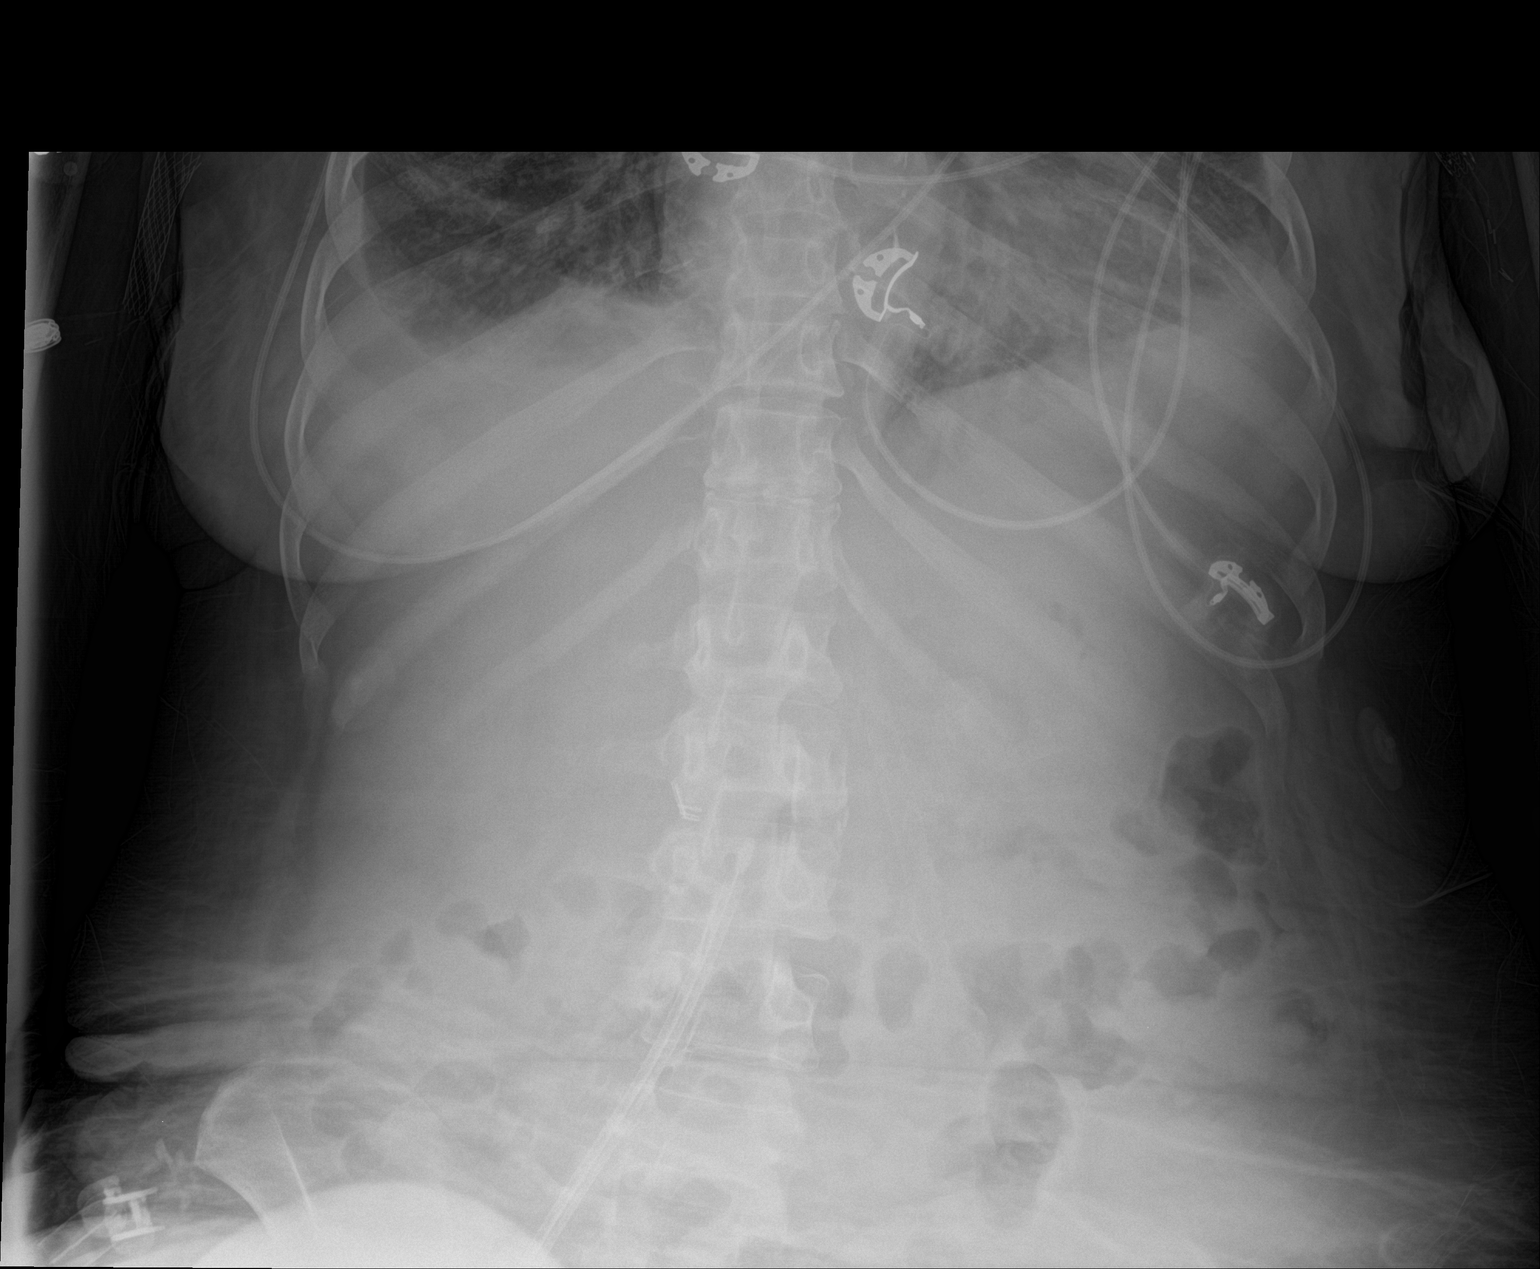

[abdomen supine]
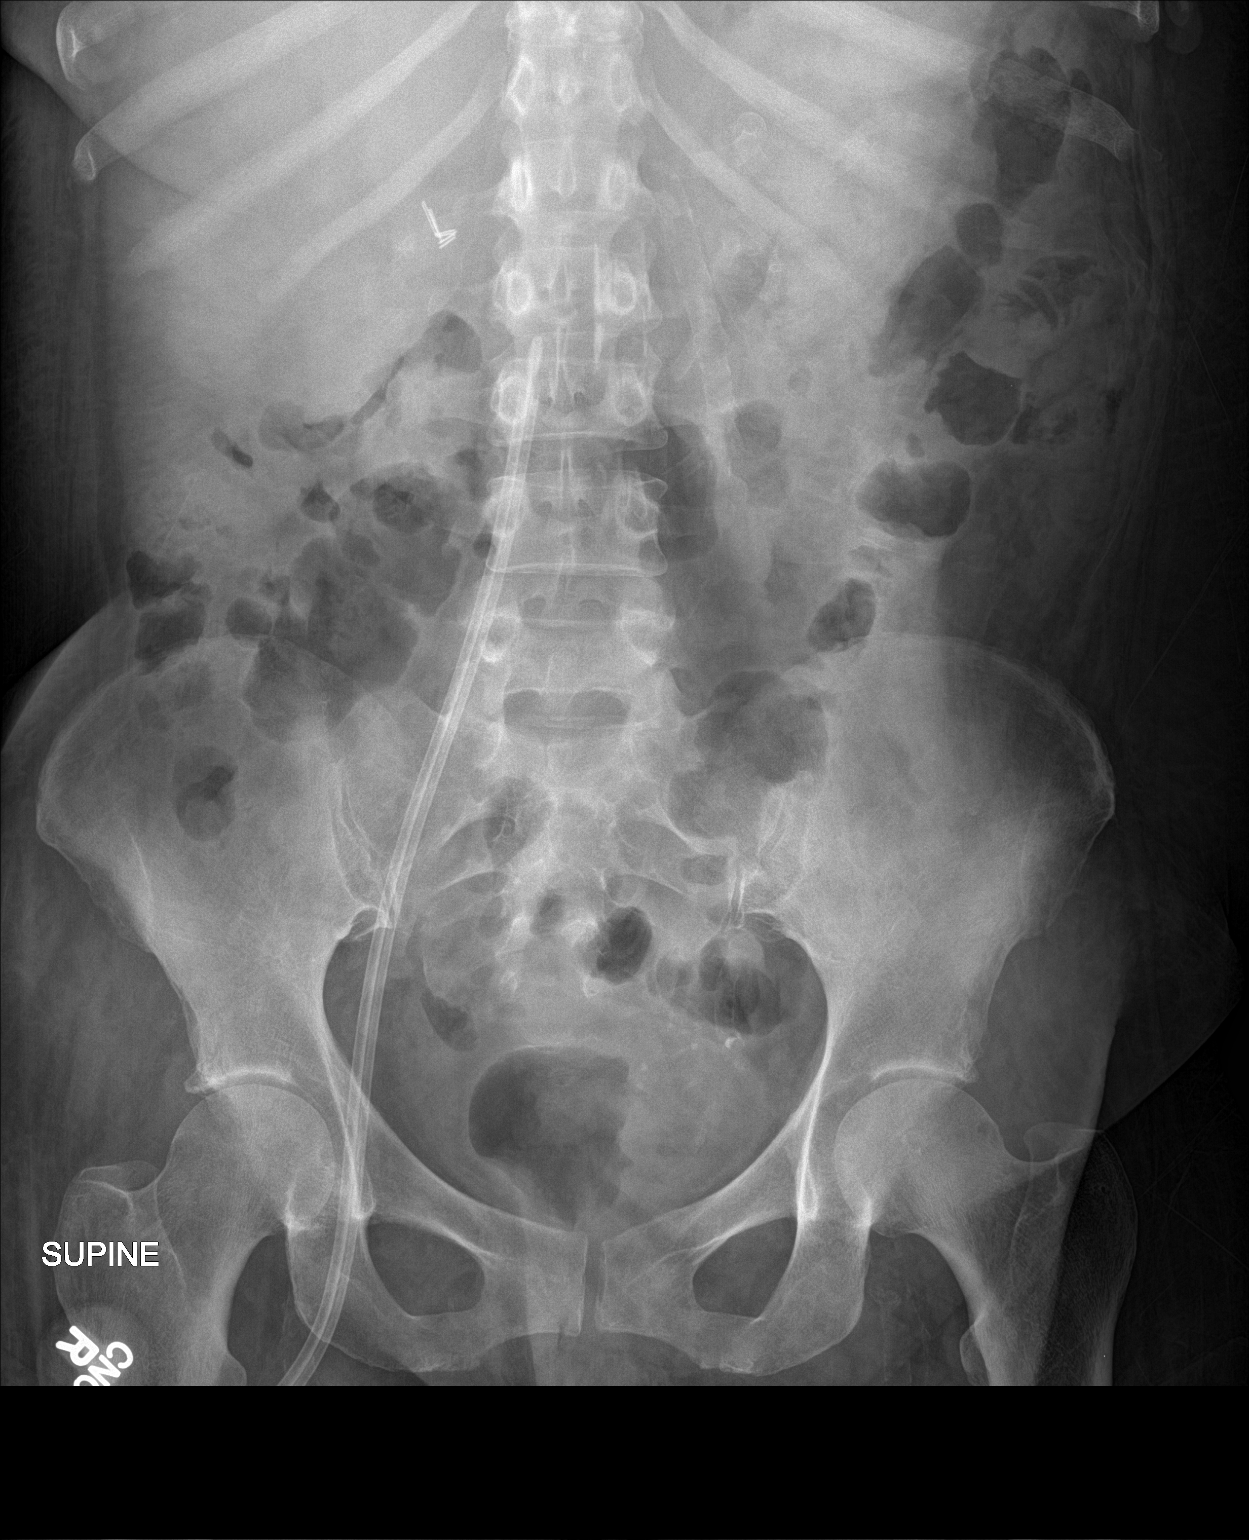

[3 of 3 positions shown; findings below may reference images not displayed]

FINDINGS: Cardiomediastinal silhouette is stable cardiomegaly again noted.
Persistent mild congestion/ interstitial edema. Again noted
bilateral small pleural effusion with bilateral basilar atelectasis
or infiltrate.

Stable right femoral dialysis catheter with tip in IVC. There is
normal small bowel gas pattern. Postcholecystectomy surgical clips
are noted. Some colonic gas noted in transverse colon.
IMPRESSION: Persistent mild congestion/ interstitial edema. Again noted small
bilateral pleural effusion with bilateral basilar atelectasis or
infiltrate. Stable right femoral dialysis catheter position with tip
in IVC. Normal small bowel gas pattern. Postcholecystectomy surgical
clips.

## 2016-04-26 MED ORDER — SODIUM CHLORIDE 0.9 % IV SOLN
Freq: Once | INTRAVENOUS | Status: AC
  Administered 2016-04-26: 15:00:00 via INTRAVENOUS

## 2016-04-26 MED ORDER — ALBUTEROL SULFATE (2.5 MG/3ML) 0.083% IN NEBU
2.5000 mg | INHALATION_SOLUTION | Freq: Four times a day (QID) | RESPIRATORY_TRACT | Status: DC
  Administered 2016-04-26 – 2016-04-27 (×8): 2.5 mg via RESPIRATORY_TRACT
  Filled 2016-04-26 (×8): qty 3

## 2016-04-26 NOTE — Evaluation (Signed)
Occupational Therapy Evaluation Patient Details Name: [name] MRN: [id number] DOB: Oct 26, 1960 Today's Date: 04/26/2016    History of Present Illness presented to ER from dialysis secondary to nausea/vomiting and reports of R-sided weakness; admitted to rule out TIA/CVA.  Imaging negative for acute event; symptoms likely metabolic in nature per notes. Hospital course complicated by labile blood sugars, periods of agitation (requiring haldol at times), and worsening confusion.   Clinical Impression   Patient was pleasant and eager to work with OT when arrived. Stated has been recently in a facility receiving therapy, and would like to get therapy to get stronger again. Patient states she lives with [name], and granddaughter [name], 58 y.o.). Patient states PLOF she was able to perform bathing and dressing most days, with extra time. Family performed meal prep, and provided her with all meals. States she was able to walk with RW until recently, when son would need to use w/c to help get her to the bathroom. Patient was able to perform bed mobility with MIN guard on [name] side of bed, and MIN A on R side of bed. Able to perform sit/stand with MIN Guard, but requires CGA/MIN A to maintain standing balance. Assist needed for clothing management using BSC. Patient was pleasant and able to respond well to all tasks, however patient is [facility] and needed some commands repeated.    Follow Up Recommendations  SNF    Equipment Recommendations       Recommendations for Other Services       Precautions / Restrictions Precautions Precautions: Fall Precaution Comments: [name] UE AV graft Restrictions Weight Bearing Restrictions: No      Mobility Bed Mobility Overal bed mobility: Needs Assistance Bed Mobility: Supine to Sit     Supine to sit: Min assist     General bed mobility comments: more difficulty supine to sit on right side of bed vs left side of bed  Transfers Overall transfer level: Needs  assistance Equipment used: Rolling walker (2 wheeled) Transfers: Sit to/from Stand Sit to Stand: Min assist         General transfer comment: requires UE support, increased time to complete    Balance                                            ADL Overall ADL's : Needs assistance/impaired                     Lower Body Dressing: Minimal assistance;Cueing for compensatory techniques   Toilet Transfer: Minimal assistance;Cueing for safety;BSC;Stand-pivot                   Vision     Perception     Praxis      Pertinent Vitals/Pain Pain Assessment: No/denies pain     Hand Dominance Right   Extremity/Trunk Assessment Upper Extremity Assessment Upper Extremity Assessment: Generalized weakness   Lower Extremity Assessment Lower Extremity Assessment: Generalized weakness       Communication Communication Communication: HOH   Cognition Arousal/Alertness: Awake/alert Behavior During Therapy: WFL for tasks assessed/performed Overall Cognitive Status: Within Functional Limits for tasks assessed                     General Comments       Exercises       Shoulder Instructions  Home Living Family/patient expects to be discharged to:: Private residence Living Arrangements: Children Available Help at Discharge: Family;Available PRN/intermittently Type of Home: House Home Access: Stairs to enter [employer] of Steps: 4 Entrance Stairs-Rails: Right;Left Home Layout: Two level;Able to live on main level with bedroom/bathroom   Alternate Level Stairs-Rails: Right;Left     Bathroom Toilet: Standard Bathroom Accessibility: Yes   Home Equipment: Wheelchair - [employer] - 2 wheels;Cane - single point;Bedside commode          Prior Functioning/Environment Level of Independence: Needs assistance  Gait / Transfers Assistance Needed: Ambulates with RW. ADL's / Homemaking Assistance Needed: Meals  prepared for her, occassional assist with dressing tasks. Sponge bathes.   Comments: [name] (via manual w/c) to HD 3x/week    OT Diagnosis: Generalized weakness   OT Problem List: Decreased strength;Decreased activity tolerance;Impaired balance (sitting and/or standing);Decreased safety awareness   OT Treatment/Interventions: Self-care/ADL training;Energy conservation;DME and/or AE instruction;Balance training;Patient/family education;Therapeutic activities    OT Goals(Current goals can be found in the care plan section) Acute Rehab OT Goals Patient Stated Goal: to go home OT Goal Formulation: With patient Time For Goal Achievement: 05/10/16 Potential to Achieve Goals: Good  OT Frequency:     Barriers to D/C:            Co-evaluation              End of Session Equipment Utilized During Treatment: Gait belt;Rolling walker;Oxygen Nurse Communication: Mobility status  Activity Tolerance: Patient tolerated treatment well Patient left: with nursing/sitter in room;Other (comment) (on BSC with CNA present in room)   Time: 1010-1040 OT Time Calculation (min): 30 min Charges:  OT General Charges $OT Visit: 1 Procedure OT Evaluation $OT Eval Low Complexity: 1 Procedure OT Treatments $Self Care/Home Management : 8-22 mins G-Codes:    [name] 2016-05-12, 12:59 PM  [name], OTR/[name]

## 2016-04-26 NOTE — Consult Note (Signed)
GI Inpatient Consult Note  Reason for Consult: Anemia    Attending Requesting Consult: Dr. Posey Pronto  History of Present Illness: [name] is a 55 y.o. female seen for evaluation of anemia at the request of Dr. Posey Pronto. Admitted on 7/31 for weakness which developed in ER. Seen in ER for abdominal pain, nausea, vomiting which seem to be chronic complaints. Has developed anemia while in patient.   She is a difficult historian to follow - history changes throughout the visit. She overall reports chronic issues w/ nausea and abdominal pain. Abdominal pain is worse if she over eats. Other times reports early satiety. Denies any vomiting in hospital, believes she vomited once prior to admission. She is unsure how often having a stool. Per chart review has had formed brown stools daily since admission. [facility] packages her meds in bubble packing and states she takes them all, including protonix and Creon. Still reports issues w/ heartburn at times. Denies dysphagia. No obvious blood in stools but does not observe stools usually.    Last Colonoscopy: 08/2015-small polyp descending colon, diverticulosis sigmoid and descending, grade 1 internal hemorrhoids, s/p fecal transplant.   Last Endoscopy: EGD - Dr. Allen Norris - 03/18/16 (for melena/n/v)- grade A esophagitis, gastritis in gastric antrum, normal duodenum.    Past Medical History:  Past Medical History:  Diagnosis Date  . Anemia   . Anxiety   . Arthritis   . Asthma   . chronic diastolic CHF [id number]  . Collagen vascular disease ([place])   . COPD (chronic obstructive pulmonary disease) ([place])   . Coronary artery disease    a. cath 2013: stenting to RCA (report not available); b. cath 2014: LM nl, pLAD 40%, mLAD nl, ost LCx 40%, mid LCx nl, pRCA 30% @ site of prior stent, mRCA 50%  . Depression   . Diabetes mellitus without complication ([place])   . Diabetic neuropathy ([place])   . dialysis 2006  . ESRD (end stage renal disease) on dialysis ([place])     M-W-F  . GERD (gastroesophageal reflux disease)   . Headache   . Hx of pancreatitis 2015  . Hypertension   . Mitral regurgitation    a. echo 10/2013: EF 62%, noWMA, mildly dilated LA, mild to mod MR/TR, GR1DD  . Myocardial infarction ([place])   . Pneumonia   . Renal insufficiency     Problem List: Patient Active Problem List   Diagnosis Date Noted  . TIA (transient ischemic attack) 04/21/2016  . Altered mental status 04/08/2016  . Hyperammonemia ([place]) 04/08/2016  . Elevated troponin 04/08/2016  . ESRD (end stage renal disease) ([place]) 04/08/2016  . Depression 04/08/2016  . Depression, major, recurrent, severe with psychosis ([place]) 04/08/2016  . Blood in stool   . Intractable cyclical vomiting with nausea   . Reflux esophagitis   . Gastritis   . Generalized abdominal pain   . Uncontrollable vomiting   . Major depressive disorder, recurrent episode, moderate ([place]) 03/15/2016  . Adjustment disorder with mixed anxiety and depressed mood 03/15/2016  . Malnutrition of moderate degree 12/01/2015  . Renal mass   . Dyspnea   . Acute renal failure ([place])   . Respiratory failure ([place])   . High temperature 11/14/2015  . Pulmonary edema   . Encounter for central line placement   . Encounter for orogastric (OG) tube placement   . Nausea 11/12/2015  . Hyperkalemia 10/03/2015  . C. difficile diarrhea 07/22/2015  . Pneumonia 05/21/2015  . Hypoglycemia 04/24/2015  . Unresponsiveness  04/24/2015  . Bradycardia 04/24/2015  . Hypothermia 04/24/2015  . Acute respiratory failure ([place]) 04/24/2015  . Acute diastolic CHF (congestive heart failure) ([place]) 04/05/2015  . Diabetic gastroparesis ([place]) 04/05/2015  . Hypokalemia 04/05/2015  . Generalized weakness 04/05/2015  . Acute pulmonary edema ([place]) 04/03/2015  . Nausea and vomiting 04/03/2015  . Hypoglycemia associated with diabetes ([place]) 04/03/2015  . Anemia of chronic disease 04/03/2015  . Secondary hyperparathyroidism ([place]) 04/03/2015  .  Pressure ulcer 04/02/2015  . Acute respiratory failure with hypoxia ([place]) 04/01/2015  . Adjustment disorder with anxiety 03/14/2015  . Somatic symptom disorder, mild 03/08/2015  . Coronary artery disease involving native coronary artery of native heart without angina pectoris   . Nausea & vomiting 03/06/2015  . Abdominal pain 03/06/2015  . DM (diabetes mellitus) ([place]) 03/06/2015  . HTN (hypertension) 03/06/2015  . Gastroparesis 02/24/2015  . Pleural effusion 02/19/2015  . HCAP (healthcare-associated pneumonia) 02/19/2015  . End-stage renal disease on hemodialysis ([place]) 02/19/2015    Past Surgical History: Past Surgical History:  Procedure Laterality Date  . ABDOMINAL HYSTERECTOMY    . APPENDECTOMY    . CARDIAC CATHETERIZATION Left 07/26/2015   Procedure: Left Heart Cath and Coronary Angiography;  Surgeon: Dionisio David, MD;  Location: [facility];  Service: Cardiovascular;  Laterality: Left;  . CHOLECYSTECTOMY    . DIALYSIS FISTULA CREATION Left    upper arm  . ESOPHAGOGASTRODUODENOSCOPY N/A 03/08/2015   Procedure: ESOPHAGOGASTRODUODENOSCOPY (EGD);  Surgeon: Manya Silvas, MD;  Location[facility] ENDOSCOPY;  Service: Endoscopy;  Laterality: N/A;  . ESOPHAGOGASTRODUODENOSCOPY (EGD) WITH PROPOFOL N/A 03/18/2016   Procedure: ESOPHAGOGASTRODUODENOSCOPY (EGD) WITH PROPOFOL;  Surgeon: [name], MD;  Location: [facility] ENDOSCOPY;  Service: Endoscopy;  Laterality: N/A;  . EYE SURGERY    . FECAL TRANSPLANT N/A 08/23/2015   Procedure: FECAL TRANSPLANT;  Surgeon: Manya Silvas, MD;  Location: [facility] ENDOSCOPY;  Service: Endoscopy;  Laterality: N/A;  . PERIPHERAL VASCULAR CATHETERIZATION N/A 12/20/2015   Procedure: Thrombectomy of dialysis access versus permcath placement;  Surgeon: Algernon Huxley, MD;  Location: [facility];  Service: Cardiovascular;  Laterality: N/A;  . PERIPHERAL VASCULAR CATHETERIZATION N/A 12/20/2015   Procedure: A/V Shunt Intervention;  Surgeon: Algernon Huxley, MD;   Location: [facility];  Service: Cardiovascular;  Laterality: N/A;  . PERIPHERAL VASCULAR CATHETERIZATION N/A 12/20/2015   Procedure: A/V Shuntogram/Fistulagram;  Surgeon: Algernon Huxley, MD;  Location: [facility];  Service: Cardiovascular;  Laterality: N/A;  . PERIPHERAL VASCULAR CATHETERIZATION N/A 01/02/2016   Procedure: A/V Shuntogram/Fistulagram;  Surgeon: Algernon Huxley, MD;  Location: [facility];  Service: Cardiovascular;  Laterality: N/A;  . PERIPHERAL VASCULAR CATHETERIZATION N/A 01/02/2016   Procedure: A/V Shunt Intervention;  Surgeon: Algernon Huxley, MD;  Location: [facility];  Service: Cardiovascular;  Laterality: N/A;    Allergies: Allergies  Allergen Reactions  . Compazine [Prochlorperazine Edisylate] Anaphylaxis and Nausea And Vomiting    23 Jul - patient relates that she takes promethazine frequently with no problems.  . Ace Inhibitors Swelling  . Ativan [Lorazepam] Other (See Comments)    Reaction:  Hallucinations and headaches  . Codeine Nausea And Vomiting  . Dilaudid [Hydromorphone Hcl] Other (See Comments)    Delirium  . Gabapentin Other (See Comments)    Reaction:  Unknown   . Losartan Other (See Comments)    Reaction:  Unknown   . Ondansetron Other (See Comments)    Reaction:  Unknown   .  Prochlorperazine Other (See Comments)    Reaction:  Unknown   . Reglan [Metoclopramide]     Per patient her Dr. Evelina Bucy her off it   . Scopolamine Other (See Comments)    Reaction:  Unknown   . Zofran [Ondansetron Hcl] Other (See Comments)    Reaction:  hallucinations   . Oxycodone Anxiety  . Tape Rash    Home Medications: Prescriptions Prior to Admission  Medication Sig Dispense Refill Last Dose  . acidophilus (RISAQUAD) CAPS capsule Take 1 capsule by mouth daily.   unknown at unknown  . albuterol (PROVENTIL HFA;VENTOLIN HFA) 108 (90 Base) MCG/ACT inhaler Inhale 2 puffs into the lungs every 4 (four) hours as needed for wheezing or shortness of  breath.   unknown at unknown  . albuterol (PROVENTIL) (2.5 MG/3ML) 0.083% nebulizer solution Take 2.5 mg by nebulization every 4 (four) hours as needed for wheezing or shortness of breath.   unknown at unknown  . amLODipine (NORVASC) 10 MG tablet Take 10 mg by mouth daily.   unknown at unknown  . aspirin EC 81 MG tablet Take 81 mg by mouth every morning.    unknown at unknown  . atorvastatin (LIPITOR) 20 MG tablet Take 20 mg by mouth every evening.   unknown at unknown  . cetirizine (ZYRTEC) 10 MG tablet Take 10 mg by mouth daily.    unknown at unknown  . clopidogrel (PLAVIX) 75 MG tablet Take 75 mg by mouth daily. Reported on 12/17/2015    at unknown  . FLUoxetine (PROZAC) 20 MG capsule Take 1 capsule (20 mg total) by mouth daily. 30 capsule 0 unknown at unknown  . glucose 4 GM chewable tablet Chew 3-4 tablets by mouth as needed for low blood sugar.   unknown at unknown  . hydrALAZINE (APRESOLINE) 25 MG tablet Take 25 mg by mouth 3 (three) times daily.   unknown at unknown  . HYDROcodone-acetaminophen (NORCO/VICODIN) 5-325 MG tablet Take 1 tablet by mouth every 6 (six) hours as needed for moderate pain or severe pain. (Patient taking differently: Take 1 tablet by mouth 2 (two) times daily as needed for moderate pain or severe pain. ) 20 tablet 0 unknown at unknown  . lipase/protease/amylase (CREON) 12000 UNITS CPEP capsule Take 12,000-36,000 Units by mouth See admin instructions. 36,000 units daily with breakfast; 24,000 units daily with lunch, and 36,000 units daily with dinner   unknown at unknown  . multivitamin (RENA-VIT) TABS tablet Take 1 tablet by mouth daily.   unknown at unknown  . OLANZapine (ZYPREXA) 2.5 MG tablet Take 1 tablet (2.5 mg total) by mouth at bedtime. 30 tablet 0 unknown at unknown  . omega-3 acid ethyl esters (LOVAZA) 1 g capsule Take 1 g by mouth daily.   unknown at unknown  . pantoprazole (PROTONIX) 40 MG tablet Take 1 tablet (40 mg total) by mouth 2 (two) times daily before  a meal. 60 tablet 2 unknown at unknown  . promethazine (PHENERGAN) 12.5 MG tablet Take 12.5 mg by mouth every 8 (eight) hours as needed for nausea.   unknown at unknown  . ranitidine (ZANTAC) 150 MG tablet Take 150 mg by mouth daily.    unknown at unknown  . ranolazine (RANEXA) 1000 MG SR tablet Take 1,000 mg by mouth 2 (two) times daily.   unknown at unknown  . rifaximin (XIFAXAN) 550 MG TABS tablet Take 1 tablet (550 mg total) by mouth 2 (two) times daily. 60 tablet 0 unknown at unknown  . sevelamer carbonate (  RENVELA) 800 MG tablet Take 800 mg by mouth 3 (three) times daily with meals.     at unknown  . vitamin B-12 (CYANOCOBALAMIN) 1000 MCG tablet Take 1,000 mcg by mouth daily.   unknown at unknown  . pregabalin (LYRICA) 25 MG capsule Take 25 mg by mouth daily.   unknown at unknown   Home medication reconciliation was completed with the patient.   Scheduled Inpatient Medications:   . acidophilus  1 capsule Oral Daily  . albuterol  2.5 mg Inhalation Q6H  . amLODipine  10 mg Oral Daily  . aspirin EC  81 mg Oral [id number]  . atorvastatin  20 mg Oral QPM  . clopidogrel  75 mg Oral Daily  . FLUoxetine  20 mg Oral Daily  . heparin subcutaneous  5,000 Units Subcutaneous Q12H  . hydrALAZINE  25 mg Oral TID  . lipase/protease/amylase  24,000 Units Oral Q lunch  . lipase/protease/amylase  36,000 Units Oral BID  . loratadine  10 mg Oral Daily  . OLANZapine  5 mg Oral QHS  . pantoprazole  40 mg Oral BID AC  . pregabalin  25 mg Oral Daily  . rifaximin  550 mg Oral BID  . sevelamer carbonate  800 mg Oral TID WC    Continuous Inpatient Infusions:   . dextrose 50 mL/hr at 04/25/16 1432    PRN Inpatient Medications:  butalbital-acetaminophen-caffeine, guaiFENesin-dextromethorphan, haloperidol lactate, HYDROcodone-acetaminophen, ipratropium-albuterol, morphine injection, promethazine  Family History: family history includes Diabetes in her mother; Kidney disease in her mother.    Social  History:   reports that she quit smoking about 14 months ago. Her smoking use included Cigarettes. She smoked 0.50 packs per day. She has never used smokeless tobacco. She reports that she does not drink alcohol or use drugs.   Review of Systems: Constitutional: Weight is stable.  Eyes: No changes in vision. ENT: No oral lesions, sore throat.  GI: see HPI.  Heme/Lymph: No easy bruising.  CV: No chest pain.  GU: No hematuria.  Integumentary: No rashes.  Neuro: No headaches.  Psych: No depression/anxiety.  Endocrine: No heat/cold intolerance.  Allergic/Immunologic: No urticaria.  Resp: No cough, SOB.  Musculoskeletal: No joint swelling.    Physical Examination: BP (!) 130/54 (BP Location: Right Arm)   Pulse 71   Temp 98.2 F (36.8 C) (Oral)   Resp 18   Ht 5' 5.5" (1.664 m)   Wt 73.6 kg (162 lb 4.1 oz)   SpO2 100%   BMI 26.59 kg/m  Gen: NAD, alert and oriented x 4 HEENT: PEERLA, EOMI, Neck: supple, no JVD or thyromegaly Chest: CTA bilaterally, no wheezes, crackles, or other adventitious sounds CV: RRR, no m/g/c/r Abd: soft, NT, ND, +BS in all four quadrants; no HSM, guarding, ridigity, or rebound tenderness Ext: no edema, well perfused with 2+ pulses, Skin: no rash or lesions noted Lymph: no LAD  Data: Lab Results  Component Value Date   WBC 6.5 04/26/2016   HGB 6.7 (L) 04/26/2016   HCT 20.2 (L) 04/26/2016   MCV 74.1 (L) 04/26/2016   PLT 199 04/26/2016    Recent Labs Lab 04/21/16 1731 04/25/16 0930 04/26/16 0437  HGB 9.3* 7.1* 6.7*   Lab Results  Component Value Date   NA 129 (L) 04/26/2016   K 4.3 04/26/2016   CL 90 (L) 04/26/2016   CO2 29 04/26/2016   BUN 12 04/26/2016   CREATININE 3.27 (H) 04/26/2016   Lab Results  Component Value Date   ALT 11 (  L) 04/21/2016   AST 22 04/21/2016   ALKPHOS 102 04/21/2016   BILITOT 0.6 04/21/2016    Recent Labs Lab 04/21/16 0843  APTT 46*  INR 1.08     CT a/p IMPRESSION: 04/21/16 Continued presence of  loculated pleural effusion in both lung bases. Bilateral renal atrophy. Aortic atherosclerosis. Diverticulosis of sigmoid colon without inflammation. Moderate anasarca. Bilateral inguinal adenopathy is noted which is slightly enlarged compared to prior exam. This most likely is inflammatory or reactive in etiology, but neoplasm cannot be excluded. Clinical correlation is recommended.  Lipase normal.   Assessment/Plan: [name] is a 55 y.o. female admitted for weakness, abd pain, nausea found to have recurrent anemia.   1. Anemia - Hgb dropped down to 6.9. She had an EGD approximately 6 weeks ago remarkable for esophagitis/gastritis. Reports compliance w/ PPI and denies NSAID use. Last colonoscopy 08/2015 - for fecal transplant so less thorough exam given no insufflation. Will repeat colonoscopy and can remove polyp at that time. Unfortunately has been on Plavix, received dose this AM. Will need 5 days off Plavix prior to procedure, will plan for mid next week, this can also be done as outpatient if discharged prior. Okay to start aspirin 81mg  in interim if needed. If colonoscopy negative would consider capsule endoscopy.   2. Abd pain/nausea - chronic, on PPI and creon. CT imaging rather unremarkable except aortic atherosclerosis and inguinal adenopathy. LFTs, lipase stable. Has gastroparesis on GES in 2011 but also reports Reglan allergy. Needs gastroparesis diet modifications.    Recommendations:   1. Colonoscopy after off plavix x 5 days. Okay to start 81mg  ASA 2. Continue w/ transfusion as planned for this afternoon.  3. Monitor H/H while in patient  4. Continue PPI BID   *Case discussed w/ Dr. Vira Agar.   Thank you for the consult. Please call with questions or concerns.  Ronney Asters, PA-C [place]

## 2016-04-26 NOTE — Progress Notes (Signed)
Patient ID: [name], female   DOB: [birth year], 54 y.o.   MRN: [id number]  [facility] PROGRESS NOTE  [name] [id number] DOB: 05-May-1961 DOA: 04/21/2016 PCP: Ellamae Sia, MD  HPI/Subjective: Patient feeling a little bit better today. Answers questions appropriately. Patient does not see any blood in her bowel movements. She complains of some cough and some dizziness.  Objective: Vitals:   04/26/16 0502 04/26/16 0836  BP: (!) 120/52 (!) 130/54  Pulse: 68 71  Resp:  18  Temp: 98.2 F (36.8 C) 98.2 F (36.8 C)    Filed Weights   04/23/16 1257 04/25/16 0919 04/25/16 1230  Weight: 72 kg (158 lb 11.7 oz) 75.1 kg (165 lb 9.1 oz) 73.6 kg (162 lb 4.1 oz)    ROS: Review of Systems  Constitutional: Negative for chills and fever.  Eyes: Negative for blurred vision.  Respiratory: Positive for cough. Negative for shortness of breath.   Cardiovascular: Negative for chest pain.  Gastrointestinal: Negative for abdominal pain, constipation, diarrhea, nausea and vomiting.  Genitourinary: Negative for dysuria.  Musculoskeletal: Negative for joint pain.  Neurological: Positive for dizziness. Negative for headaches.   Exam: Physical Exam  Constitutional: She is oriented to person, place, and time.  HENT:  Nose: No mucosal edema.  Mouth/Throat: No oropharyngeal exudate or posterior oropharyngeal edema.  Eyes: Conjunctivae, EOM and lids are normal. Pupils are equal, round, and reactive to light.  Neck: No JVD present. Carotid bruit is not present. No edema present. No thyroid mass and no thyromegaly present.  Cardiovascular: S1 normal and S2 normal.  Exam reveals no gallop.   No murmur heard. Pulses:      Dorsalis pedis pulses are 2+ on the right side, and 2+ on the left side.  Respiratory: No respiratory distress. She has no wheezes. She has no rhonchi. She has no rales.  GI: Soft. Bowel sounds are normal. There is no tenderness.  Musculoskeletal:       Right shoulder: She  exhibits no swelling.  Lymphadenopathy:    She has no cervical adenopathy.  Neurological: She is alert and oriented to person, place, and time. No cranial nerve deficit.  Skin: Skin is warm. No rash noted. Nails show no clubbing.  Psychiatric: She has a normal mood and affect.      Data Reviewed: Basic Metabolic Panel:  Recent Labs Lab 04/21/16 0843 04/21/16 1731 04/25/16 0930 04/26/16 0437  NA 132*  --  124* 129*  K 4.4  --  4.7 4.3  CL 92*  --  87* 90*  CO2 28  --  28 29  GLUCOSE 85  --  130* 121*  BUN 21*  --  23* 12  CREATININE 4.97* 5.31* 4.77* 3.27*  CALCIUM 9.0  --  8.4* 8.1*  PHOS  --   --  3.1  --    Liver Function Tests:  Recent Labs Lab 04/21/16 0843 04/25/16 0930  AST 22  --   ALT 11*  --   ALKPHOS 102  --   BILITOT 0.6  --   PROT 8.4*  --   ALBUMIN 3.4* 2.8*    Recent Labs Lab 04/21/16 0843 04/26/16 0437  LIPASE <10* 10*    Recent Labs Lab 04/21/16 0843 04/23/16 1459  AMMONIA 27 39*   CBC:  Recent Labs Lab 04/21/16 0843 04/21/16 1731 04/25/16 0930 04/26/16 0437  WBC 7.1 6.9 8.6 6.5  NEUTROABS 4.3  --   --   --   HGB 9.3*  9.3* 7.1* 6.7*  HCT 28.3* 28.0* 21.3* 20.2*  MCV 74.2* 73.5* 72.6* 74.1*  PLT 257 254 198 199      CBG:  Recent Labs Lab 04/25/16 1754 04/26/16 0244 04/26/16 0501 04/26/16 0717 04/26/16 1144  GLUCAP 172* 141* 124* 144* 208*    Recent Results (from the past 240 hour(s))  Urine culture     Status: Abnormal   Collection Time: 04/21/16  9:19 AM  Result Value Ref Range Status   Specimen Description URINE, CLEAN CATCH  Final   Special Requests Normal  Final   Culture >=100,000 COLONIES/mL PROTEUS MIRABILIS (A)  Final   Report Status 04/23/2016 FINAL  Final   Organism ID, Bacteria PROTEUS MIRABILIS (A)  Final      Susceptibility   Proteus mirabilis - MIC*    AMPICILLIN <=2 SENSITIVE Sensitive     CEFAZOLIN 8 SENSITIVE Sensitive     CEFTRIAXONE <=1 SENSITIVE Sensitive     CIPROFLOXACIN <=0.25  SENSITIVE Sensitive     GENTAMICIN <=1 SENSITIVE Sensitive     IMIPENEM 4 SENSITIVE Sensitive     NITROFURANTOIN 256 RESISTANT Resistant     TRIMETH/SULFA <=20 SENSITIVE Sensitive     AMPICILLIN/SULBACTAM <=2 SENSITIVE Sensitive     PIP/TAZO <=4 SENSITIVE Sensitive     * >=100,000 COLONIES/mL PROTEUS MIRABILIS  MRSA PCR Screening     Status: None   Collection Time: 04/21/16  6:07 PM  Result Value Ref Range Status   MRSA by PCR NEGATIVE NEGATIVE Final    Comment:        The GeneXpert MRSA Assay (FDA approved for NASAL specimens only), is one component of a comprehensive MRSA colonization surveillance program. It is not intended to diagnose MRSA infection nor to guide or monitor treatment for MRSA infections.      Studies: Dg Chest 1 View  Result Date: 04/26/2016 CLINICAL DATA:  Short of breath for 2 days EXAM: CHEST 1 VIEW COMPARISON:  04/07/2016 FINDINGS: Cardiac silhouette is enlarged. There is bilateral pleural effusions and basilar atelectasis. Mild airspace disease at the lung bases. Upper lungs are relatively clear. Vascular stent noted in the LEFT axilla IMPRESSION: 1. No significant change. 2. Bibasilar effusions and airspace disease representing edema versus pneumonia. Electronically Signed   By: Suzy Bouchard M.D.   On: 04/26/2016 07:42    Scheduled Meds: . sodium chloride   Intravenous Once  . acidophilus  1 capsule Oral Daily  . albuterol  2.5 mg Inhalation Q6H  . amLODipine  10 mg Oral Daily  . atorvastatin  20 mg Oral QPM  . FLUoxetine  20 mg Oral Daily  . hydrALAZINE  25 mg Oral TID  . lipase/protease/amylase  24,000 Units Oral Q lunch  . lipase/protease/amylase  36,000 Units Oral BID  . loratadine  10 mg Oral Daily  . OLANZapine  5 mg Oral QHS  . pantoprazole  40 mg Oral BID AC  . pregabalin  25 mg Oral Daily  . rifaximin  550 mg Oral BID  . sevelamer carbonate  800 mg Oral TID WC   Continuous Infusions: . dextrose 30 mL/hr at 04/26/16 1055     Assessment/Plan:  1. Acute encephalopathy. Likely multifactorial with hypoglycemia, hypoxia and episodes of psychosis. 2. Anemia. Likely acute on chronic. Transfuse 1 unit of packed red blood cells for hemoglobin of 6.7. Hold aspirin and Plavix. Recent endoscopy showing esophagitis and mild gastritis. 3. Hypoglycemia on D10 drip for a long period of time. Sugars trending better today since patient  is awake and eating better. Decreased D10 drip down to 20 cc per hour and hopefully be coming off of this in the next day or so. Ranexa may have contributed 4. Acute cystitis. Patient finished course of Ceftin 5. Abdominal pain nausea vomiting. Patient does not complain of this today 6. End-stage renal disease on hemodialysis as per nephrology 7. Hyponatremia. Dialysis to help with electrolytes. 8. Hallucination with history of depression. Could've worsened with urinary tract infection. Seems better now. Could also worsen with hypoglycemic episodes  Code Status:     Code Status Orders        Start     Ordered   04/21/16 1559  Full code  Continuous     04/21/16 1559    Code Status History    Date Active Date Inactive Code Status Order ID Comments User Context   04/21/2016  3:59 PM 04/21/2016  5:17 PM Full Code [id number]  Nicholes Mango, MD Inpatient   04/08/2016  2:15 PM 04/11/2016  7:47 PM Full Code [id number]  Theodoro Grist, MD Inpatient   03/15/2016  1:22 PM 03/20/2016 12:19 AM Full Code [id number]  Bettey Costa, MD Inpatient   02/16/2016  2:09 PM 02/19/2016 10:38 PM Full Code [id number]  Bettey Costa, MD ED   12/17/2015 11:39 AM 12/21/2015 10:09 PM Full Code [id number]  Dustin Flock, MD ED   11/12/2015  4:02 PM 12/01/2015  7:43 PM Full Code [id number]  Bettey Costa, MD Inpatient   10/07/2015  9:37 AM 10/12/2015  7:59 PM Full Code [id number]  Fritzi Mandes, MD ED   10/03/2015  3:55 AM 10/05/2015 10:30 PM Full Code [id number]  Lance Coon, MD Inpatient   08/31/2015  4:00 PM 09/03/2015 10:09 PM Full Code [id number]   Demetrios Loll, MD ED   07/26/2015  3:28 PM 07/26/2015  9:36 PM Full Code [id number]  Dionisio David, MD Inpatient   07/22/2015  5:36 PM 07/26/2015  3:28 PM Full Code [id number]  Vaughan Basta, MD Inpatient   06/05/2015  8:39 PM 06/07/2015  8:03 PM Full Code [id number]  Bettey Costa, MD Inpatient   05/21/2015  9:42 PM 05/24/2015  4:26 PM Full Code [id number]  Henreitta Leber, MD Inpatient   04/24/2015  8:55 AM 04/26/2015 10:11 PM Full Code [id number]  Demetrios Loll, MD Inpatient   04/01/2015  3:58 PM 04/05/2015  8:07 PM Full Code [id number]  Epifanio Lesches, MD ED   03/06/2015  8:05 AM 03/09/2015  9:11 PM Full Code [id number]  Juluis Mire, MD Inpatient   02/19/2015  4:14 PM 02/24/2015  5:41 PM Full Code [id number]  Aldean Jewett, MD Inpatient    Advance Directive Documentation   Flowsheet Row Most Recent Value  Type of Advance Directive  Healthcare Power of [place], Living will  Pre-existing out of facility DNR order (yellow form or pink MOST form)  No data  "MOST" Form in Place?  No data     Family Communication: Spoke with son on the phone Disposition Plan: To be determined  Consultants:  Nephrology  GI  Neuro  Time spent: 30 minutes  Loletha Grayer  [employer]

## 2016-04-26 NOTE — Progress Notes (Signed)
[facility]  ROUNDING NOTE   Subjective:   Hemoglobin down to 6.7 Transfusion PRBC 1 Unit ordered.  GI consulted  Objective:  Vital signs in last 24 hours:  Temp:  [97.8 F (36.6 C)-98.2 F (36.8 C)] 98.2 F (36.8 C) (08/05 0836) Pulse Rate:  [67-80] 71 (08/05 0836) Resp:  [17-18] 18 (08/05 0836) BP: (120-150)/(45-64) 130/54 (08/05 0836) SpO2:  [97 %-100 %] 100 % (08/05 0836) Weight:  [73.6 kg (162 lb 4.1 oz)] 73.6 kg (162 lb 4.1 oz) (08/04 1230)  Weight change:   Filed Weights   04/23/16 1257 04/25/16 0919 04/25/16 1230  Weight: 72 kg (158 lb 11.7 oz) 75.1 kg (165 lb 9.1 oz) 73.6 kg (162 lb 4.1 oz)    Intake/Output: I/O last 3 completed shifts: In: -  Out: 2000 [Other:2000]   Intake/Output this shift:  Total I/O In: 240 [P.O.:240] Out: -   Physical Exam: General: NAD, laying in bed  Head: Normocephalic, atraumatic. Moist oral mucosal membranes  Eyes: Anicteric, PERRL  Neck: Supple, trachea midline  Lungs:  Clear to auscultation  Heart: Regular rate and rhythm  Abdomen:  Soft, nontender  Extremities: no peripheral edema.  Neurologic: Nonfocal, moving all four extremities  Skin: No lesions  Access: Left AVF     Basic Metabolic Panel:  Recent Labs Lab 04/21/16 0843 04/21/16 1731 04/25/16 0930 04/26/16 0437  NA 132*  --  124* 129*  K 4.4  --  4.7 4.3  CL 92*  --  87* 90*  CO2 28  --  28 29  GLUCOSE 85  --  130* 121*  BUN 21*  --  23* 12  CREATININE 4.97* 5.31* 4.77* 3.27*  CALCIUM 9.0  --  8.4* 8.1*  PHOS  --   --  3.1  --     Liver Function Tests:  Recent Labs Lab 04/21/16 0843 04/25/16 0930  AST 22  --   ALT 11*  --   ALKPHOS 102  --   BILITOT 0.6  --   PROT 8.4*  --   ALBUMIN 3.4* 2.8*    Recent Labs Lab 04/21/16 0843 04/26/16 0437  LIPASE <10* 10*    Recent Labs Lab 04/21/16 0843 04/23/16 1459  AMMONIA 27 39*    CBC:  Recent Labs Lab 04/21/16 0843 04/21/16 1731 04/25/16 0930 04/26/16 0437  WBC 7.1  6.9 8.6 6.5  NEUTROABS 4.3  --   --   --   HGB 9.3* 9.3* 7.1* 6.7*  HCT 28.3* 28.0* 21.3* 20.2*  MCV 74.2* 73.5* 72.6* 74.1*  PLT 257 254 198 199    Cardiac Enzymes: No results for input(s): CKTOTAL, CKMB, CKMBINDEX, TROPONINI in the last 168 hours.  BNP: Invalid input(s): POCBNP  CBG:  Recent Labs Lab 04/25/16 1241 04/25/16 1754 04/26/16 0244 04/26/16 0501 04/26/16 0717  GLUCAP 120* 172* 141* 124* 144*    Microbiology: Results for orders placed or performed during the hospital encounter of 04/21/16  Urine culture     Status: Abnormal   Collection Time: 04/21/16  9:19 AM  Result Value Ref Range Status   Specimen Description URINE, CLEAN CATCH  Final   Special Requests Normal  Final   Culture >=100,000 COLONIES/mL PROTEUS MIRABILIS (A)  Final   Report Status 04/23/2016 FINAL  Final   Organism ID, Bacteria PROTEUS MIRABILIS (A)  Final      Susceptibility   Proteus mirabilis - MIC*    AMPICILLIN <=2 SENSITIVE Sensitive     CEFAZOLIN 8 SENSITIVE  Sensitive     CEFTRIAXONE <=1 SENSITIVE Sensitive     CIPROFLOXACIN <=0.25 SENSITIVE Sensitive     GENTAMICIN <=1 SENSITIVE Sensitive     IMIPENEM 4 SENSITIVE Sensitive     NITROFURANTOIN 256 RESISTANT Resistant     TRIMETH/SULFA <=20 SENSITIVE Sensitive     AMPICILLIN/SULBACTAM <=2 SENSITIVE Sensitive     PIP/TAZO <=4 SENSITIVE Sensitive     * >=100,000 COLONIES/mL PROTEUS MIRABILIS  MRSA PCR Screening     Status: None   Collection Time: 04/21/16  6:07 PM  Result Value Ref Range Status   MRSA by PCR NEGATIVE NEGATIVE Final    Comment:        The GeneXpert MRSA Assay (FDA approved for NASAL specimens only), is one component of a comprehensive MRSA colonization surveillance program. It is not intended to diagnose MRSA infection nor to guide or monitor treatment for MRSA infections.     Coagulation Studies: No results for input(s): LABPROT, INR in the last 72 hours.  Urinalysis: No results for input(s):  COLORURINE, LABSPEC, PHURINE, GLUCOSEU, HGBUR, BILIRUBINUR, KETONESUR, PROTEINUR, UROBILINOGEN, NITRITE, LEUKOCYTESUR in the last 72 hours.  Invalid input(s): APPERANCEUR    Imaging: Dg Chest 1 View  Result Date: 04/26/2016 CLINICAL DATA:  Short of breath for 2 days EXAM: CHEST 1 VIEW COMPARISON:  04/07/2016 FINDINGS: Cardiac silhouette is enlarged. There is bilateral pleural effusions and basilar atelectasis. Mild airspace disease at the lung bases. Upper lungs are relatively clear. Vascular stent noted in the LEFT axilla IMPRESSION: 1. No significant change. 2. Bibasilar effusions and airspace disease representing edema versus pneumonia. Electronically Signed   By: Suzy Bouchard M.D.   On: 04/26/2016 07:42     Medications:   . dextrose 30 mL/hr at 04/26/16 1055   . sodium chloride   Intravenous Once  . acidophilus  1 capsule Oral Daily  . albuterol  2.5 mg Inhalation Q6H  . amLODipine  10 mg Oral Daily  . atorvastatin  20 mg Oral QPM  . FLUoxetine  20 mg Oral Daily  . hydrALAZINE  25 mg Oral TID  . lipase/protease/amylase  24,000 Units Oral Q lunch  . lipase/protease/amylase  36,000 Units Oral BID  . loratadine  10 mg Oral Daily  . OLANZapine  5 mg Oral QHS  . pantoprazole  40 mg Oral BID AC  . pregabalin  25 mg Oral Daily  . rifaximin  550 mg Oral BID  . sevelamer carbonate  800 mg Oral TID WC   butalbital-acetaminophen-caffeine, guaiFENesin-dextromethorphan, haloperidol lactate, HYDROcodone-acetaminophen, ipratropium-albuterol, morphine injection, promethazine  Assessment/ Plan:  [name] is a 55 y.o. black female  with insulin dependent diabetes mellitus type II, hypertension, hyperlipidemia, diabetic retinopathy, diabetic neuropathy, diabetic gastroparesis, pancreatic insufficiency, depression, GERD. Renal masses - avoiding epo   MWF [facility] [address]   1. End Stage Renal Disease:   - Continue MWF schedule  2. Anemia of chronic kidney disease: Not getting  erythropoeitin stimulating agent as outpatient due to renal masses. However CT from 6/24 does not seem to show any renal masses.  - Consulted hematology: okay to use epo. Restarted - GI consulted. - PRBC transfusion ordered.   3. Secondary Hyperparathyroidism:  - Continue sevelamer  4. Hypertension: at goal.   - amlodipine and hydralazine.   5. Encephalopathy: appreciate psych and neurology input. CT head on 7/18 negative.  - olanzapine   LOS: [place], [place] 8/5/201711:35 AM

## 2016-04-27 LAB — GLUCOSE, CAPILLARY
Glucose-Capillary: 185 mg/dL — ABNORMAL HIGH (ref 65–99)
Glucose-Capillary: 132 mg/dL — ABNORMAL HIGH (ref 65–99)
Glucose-Capillary: 123 mg/dL — ABNORMAL HIGH (ref 65–99)
Glucose-Capillary: 131 mg/dL — ABNORMAL HIGH (ref 65–99)
Glucose-Capillary: 114 mg/dL — ABNORMAL HIGH (ref 65–99)
Glucose-Capillary: 181 mg/dL — ABNORMAL HIGH (ref 65–99)
Glucose-Capillary: 164 mg/dL — ABNORMAL HIGH (ref 65–99)
Glucose-Capillary: 139 mg/dL — ABNORMAL HIGH (ref 65–99)
Glucose-Capillary: 146 mg/dL — ABNORMAL HIGH (ref 65–99)
Glucose-Capillary: 161 mg/dL — ABNORMAL HIGH (ref 65–99)

## 2016-04-27 LAB — TYPE AND SCREEN
ABO/RH(D): O POS
Antibody Screen: NEGATIVE
Unit division: 0

## 2016-04-27 MED ORDER — BENZONATATE 100 MG PO CAPS
200.0000 mg | ORAL_CAPSULE | Freq: Three times a day (TID) | ORAL | Status: DC | PRN
  Administered 2016-04-27: 20:00:00 200 mg via ORAL
  Filled 2016-04-27: qty 2

## 2016-04-27 NOTE — Progress Notes (Signed)
Patient ID: [name], female   DOB: July 26, 1961, 55 y.o.   MRN: [id number]  [facility] PROGRESS NOTE  THUYVI RAYNER [id number] DOB: 1960/11/26 DOA: 04/21/2016 PCP: Ellamae Sia, MD  HPI/Subjective: Patient with lots of complaints. Complains of cough, headache. Right shoulder pain. Back spasms.  Objective: Vitals:   04/27/16 0516 04/27/16 1004  BP: (!) 139/51 (!) 153/57  Pulse: 80 82  Resp:  16  Temp: 98.6 F (37 C)     Filed Weights   04/23/16 1257 04/25/16 0919 04/25/16 1230  Weight: 72 kg (158 lb 11.7 oz) 75.1 kg (165 lb 9.1 oz) 73.6 kg (162 lb 4.1 oz)    ROS: Review of Systems  Constitutional: Negative for chills and fever.  Eyes: Negative for blurred vision.  Respiratory: Positive for cough. Negative for shortness of breath.   Cardiovascular: Negative for chest pain.  Gastrointestinal: Positive for nausea. Negative for abdominal pain, constipation, diarrhea and vomiting.  Genitourinary: Negative for dysuria.  Musculoskeletal: Positive for back pain and joint pain.  Neurological: Positive for dizziness and headaches.   Exam: Physical Exam  Constitutional: She is oriented to person, place, and time.  HENT:  Nose: No mucosal edema.  Mouth/Throat: No oropharyngeal exudate or posterior oropharyngeal edema.  Eyes: Conjunctivae, EOM and lids are normal. Pupils are equal, round, and reactive to light.  Neck: No JVD present. Carotid bruit is not present. No edema present. No thyroid mass and no thyromegaly present.  Cardiovascular: S1 normal and S2 normal.  Exam reveals no gallop.   No murmur heard. Pulses:      Dorsalis pedis pulses are 2+ on the right side, and 2+ on the left side.  Respiratory: No respiratory distress. She has no wheezes. She has no rhonchi. She has no rales.  GI: Soft. Bowel sounds are normal. There is no tenderness.  Musculoskeletal:       Right shoulder: She exhibits no swelling.  Lymphadenopathy:    She has no cervical adenopathy.   Neurological: She is alert and oriented to person, place, and time. No cranial nerve deficit.  Skin: Skin is warm. No rash noted. Nails show no clubbing.  Psychiatric: She has a normal mood and affect.      Data Reviewed: Basic Metabolic Panel:  Recent Labs Lab 04/21/16 0843 04/21/16 1731 04/25/16 0930 04/26/16 0437  NA 132*  --  124* 129*  K 4.4  --  4.7 4.3  CL 92*  --  87* 90*  CO2 28  --  28 29  GLUCOSE 85  --  130* 121*  BUN 21*  --  23* 12  CREATININE 4.97* 5.31* 4.77* 3.27*  CALCIUM 9.0  --  8.4* 8.1*  PHOS  --   --  3.1  --    Liver Function Tests:  Recent Labs Lab 04/21/16 0843 04/25/16 0930  AST 22  --   ALT 11*  --   ALKPHOS 102  --   BILITOT 0.6  --   PROT 8.4*  --   ALBUMIN 3.4* 2.8*    Recent Labs Lab 04/21/16 0843 04/26/16 0437  LIPASE <10* 10*    Recent Labs Lab 04/21/16 0843 04/23/16 1459  AMMONIA 27 39*   CBC:  Recent Labs Lab 04/21/16 0843 04/21/16 1731 04/25/16 0930 04/26/16 0437 04/26/16 2016  WBC 7.1 6.9 8.6 6.5  --   NEUTROABS 4.3  --   --   --   --   HGB 9.3* 9.3* 7.1* 6.7* 8.3*  HCT 28.3* 28.0* 21.3* 20.2* 25.2*  MCV 74.2* 73.5* 72.6* 74.1*  --   PLT 257 254 198 199  --       CBG:  Recent Labs Lab 04/27/16 0259 04/27/16 0537 04/27/16 0728 04/27/16 1000 04/27/16 1141  GLUCAP 185* 132* 123* 131* 114*    Recent Results (from the past 240 hour(s))  Urine culture     Status: Abnormal   Collection Time: 04/21/16  9:19 AM  Result Value Ref Range Status   Specimen Description URINE, CLEAN CATCH  Final   Special Requests Normal  Final   Culture >=100,000 COLONIES/mL PROTEUS MIRABILIS (A)  Final   Report Status 04/23/2016 FINAL  Final   Organism ID, Bacteria PROTEUS MIRABILIS (A)  Final      Susceptibility   Proteus mirabilis - MIC*    AMPICILLIN <=2 SENSITIVE Sensitive     CEFAZOLIN 8 SENSITIVE Sensitive     CEFTRIAXONE <=1 SENSITIVE Sensitive     CIPROFLOXACIN <=0.25 SENSITIVE Sensitive      GENTAMICIN <=1 SENSITIVE Sensitive     IMIPENEM 4 SENSITIVE Sensitive     NITROFURANTOIN 256 RESISTANT Resistant     TRIMETH/SULFA <=20 SENSITIVE Sensitive     AMPICILLIN/SULBACTAM <=2 SENSITIVE Sensitive     PIP/TAZO <=4 SENSITIVE Sensitive     * >=100,000 COLONIES/mL PROTEUS MIRABILIS  MRSA PCR Screening     Status: None   Collection Time: 04/21/16  6:07 PM  Result Value Ref Range Status   MRSA by PCR NEGATIVE NEGATIVE Final    Comment:        The GeneXpert MRSA Assay (FDA approved for NASAL specimens only), is one component of a comprehensive MRSA colonization surveillance program. It is not intended to diagnose MRSA infection nor to guide or monitor treatment for MRSA infections.      Studies: Dg Chest 1 View  Result Date: 04/26/2016 CLINICAL DATA:  Short of breath for 2 days EXAM: CHEST 1 VIEW COMPARISON:  04/07/2016 FINDINGS: Cardiac silhouette is enlarged. There is bilateral pleural effusions and basilar atelectasis. Mild airspace disease at the lung bases. Upper lungs are relatively clear. Vascular stent noted in the LEFT axilla IMPRESSION: 1. No significant change. 2. Bibasilar effusions and airspace disease representing edema versus pneumonia. Electronically Signed   By: Suzy Bouchard M.D.   On: 04/26/2016 07:42    Scheduled Meds: . acidophilus  1 capsule Oral Daily  . albuterol  2.5 mg Inhalation Q6H  . amLODipine  10 mg Oral Daily  . atorvastatin  20 mg Oral QPM  . FLUoxetine  20 mg Oral Daily  . hydrALAZINE  25 mg Oral TID  . lipase/protease/amylase  24,000 Units Oral Q lunch  . lipase/protease/amylase  36,000 Units Oral BID  . loratadine  10 mg Oral Daily  . OLANZapine  5 mg Oral QHS  . pantoprazole  40 mg Oral BID AC  . pregabalin  25 mg Oral Daily  . rifaximin  550 mg Oral BID  . sevelamer carbonate  800 mg Oral TID WC    Assessment/Plan:  1. Acute encephalopathy. Likely multifactorial with hypoglycemia, hypoxia and episodes of psychosis. 2. Anemia.  Likely acute on chronic. Patient transfused 1 unit of packed red blood cells with good response. Hold aspirin and Plavix. Recent endoscopy showing esophagitis and mild gastritis. GI to do colonoscopy once off Plavix for 7 days. 3. Hypoglycemia on D10 drip for a long period of time. Stop D10 drip today since sugars have trended better. Check fingersticks every  2 hours for now 4. Acute cystitis. Patient finished course of Ceftin 5. Abdominal pain nausea vomiting. Patient did have some abdominal pain today in the epigastric area. As per the nurse the patient is taking nausea medications. 6. End-stage renal disease on hemodialysis as per nephrology 7. Hyponatremia. Dialysis to help with electrolytes. 8. Hallucination with history of depression. Could've worsened with urinary tract infection. Seems better now. Could also worsen with hypoglycemic episodes.  Code Status:     Code Status Orders        Start     Ordered   04/21/16 1559  Full code  Continuous     04/21/16 1559    Code Status History    Date Active Date Inactive Code Status Order ID Comments User Context   04/21/2016  3:59 PM 04/21/2016  5:17 PM Full Code [id number]  Nicholes Mango, MD Inpatient   04/08/2016  2:15 PM 04/11/2016  7:47 PM Full Code [id number]  Theodoro Grist, MD Inpatient   03/15/2016  1:22 PM 03/20/2016 12:19 AM Full Code [id number]  Bettey Costa, MD Inpatient   02/16/2016  2:09 PM 02/19/2016 10:38 PM Full Code [id number]  Bettey Costa, MD ED   12/17/2015 11:39 AM 12/21/2015 10:09 PM Full Code [id number]  Dustin Flock, MD ED   11/12/2015  4:02 PM 12/01/2015  7:43 PM Full Code [id number]  Bettey Costa, MD Inpatient   10/07/2015  9:37 AM 10/12/2015  7:59 PM Full Code [id number]  Fritzi Mandes, MD ED   10/03/2015  3:55 AM 10/05/2015 10:30 PM Full Code [id number]  Lance Coon, MD Inpatient   08/31/2015  4:00 PM 09/03/2015 10:09 PM Full Code [id number]  Demetrios Loll, MD ED   07/26/2015  3:28 PM 07/26/2015  9:36 PM Full Code [id number]  Dionisio David, MD  Inpatient   07/22/2015  5:36 PM 07/26/2015  3:28 PM Full Code [id number]  Vaughan Basta, MD Inpatient   06/05/2015  8:39 PM 06/07/2015  8:03 PM Full Code [id number]  Bettey Costa, MD Inpatient   05/21/2015  9:42 PM 05/24/2015  4:26 PM Full Code [id number]  Henreitta Leber, MD Inpatient   04/24/2015  8:55 AM 04/26/2015 10:11 PM Full Code [id number]  Demetrios Loll, MD Inpatient   04/01/2015  3:58 PM 04/05/2015  8:07 PM Full Code [id number]  Epifanio Lesches, MD ED   03/06/2015  8:05 AM 03/09/2015  9:11 PM Full Code [id number]  Juluis Mire, MD Inpatient   02/19/2015  4:14 PM 02/24/2015  5:41 PM Full Code [id number]  Aldean Jewett, MD Inpatient    Advance Directive Documentation   Flowsheet Row Most Recent Value  Type of Advance Directive  Healthcare Power of Attorney, Living will  Pre-existing out of facility DNR order (yellow form or pink MOST form)  No data  "MOST" Form in Place?  No data     Family Communication: Spoke with son on the phoneYesterday Disposition Plan: To be determined  Consultants:  Nephrology  GI  Neuro  Time spent: 26 minutes  Loletha Grayer  [employer]

## 2016-04-27 NOTE — Progress Notes (Signed)
[facility]  ROUNDING NOTE   Subjective:   PRBC transfusion yesterday.  Continues to have confusion and she is getting frustrated with not being able to communicate effectively.   Objective:  Vital signs in last 24 hours:  Temp:  [98.3 F (36.8 C)-98.6 F (37 C)] 98.6 F (37 C) (08/06 0516) Pulse Rate:  [73-82] 82 (08/06 1004) Resp:  [16-20] 16 (08/06 1004) BP: (134-153)/(51-88) 153/57 (08/06 1004) SpO2:  [98 %-100 %] 100 % (08/06 1004)  Weight change:   Filed Weights   04/23/16 1257 04/25/16 0919 04/25/16 1230  Weight: 72 kg (158 lb 11.7 oz) 75.1 kg (165 lb 9.1 oz) 73.6 kg (162 lb 4.1 oz)    Intake/Output: I/O last 3 completed shifts: In: 950 [P.O.:600; I.V.:20; Blood:330] Out: 2 [Urine:2]   Intake/Output this shift:  Total I/O In: 240 [P.O.:240] Out: -   Physical Exam: General: NAD, laying in bed  Head: Normocephalic, atraumatic. Moist oral mucosal membranes  Eyes: Anicteric, PERRL  Neck: Supple, trachea midline  Lungs:  Clear to auscultation  Heart: Regular rate and rhythm  Abdomen:  Soft, nontender  Extremities: no peripheral edema.  Neurologic: Nonfocal, moving all four extremities  Skin: No lesions  Access: Left AVF     Basic Metabolic Panel:  Recent Labs Lab 04/21/16 0843 04/21/16 1731 04/25/16 0930 04/26/16 0437  NA 132*  --  124* 129*  K 4.4  --  4.7 4.3  CL 92*  --  87* 90*  CO2 28  --  28 29  GLUCOSE 85  --  130* 121*  BUN 21*  --  23* 12  CREATININE 4.97* 5.31* 4.77* 3.27*  CALCIUM 9.0  --  8.4* 8.1*  PHOS  --   --  3.1  --     Liver Function Tests:  Recent Labs Lab 04/21/16 0843 04/25/16 0930  AST 22  --   ALT 11*  --   ALKPHOS 102  --   BILITOT 0.6  --   PROT 8.4*  --   ALBUMIN 3.4* 2.8*    Recent Labs Lab 04/21/16 0843 04/26/16 0437  LIPASE <10* 10*    Recent Labs Lab 04/21/16 0843 04/23/16 1459  AMMONIA 27 39*    CBC:  Recent Labs Lab 04/21/16 0843 04/21/16 1731 04/25/16 0930  04/26/16 0437 04/26/16 2016  WBC 7.1 6.9 8.6 6.5  --   NEUTROABS 4.3  --   --   --   --   HGB 9.3* 9.3* 7.1* 6.7* 8.3*  HCT 28.3* 28.0* 21.3* 20.2* 25.2*  MCV 74.2* 73.5* 72.6* 74.1*  --   PLT 257 254 198 199  --     Cardiac Enzymes: No results for input(s): CKTOTAL, CKMB, CKMBINDEX, TROPONINI in the last 168 hours.  BNP: Invalid input(s): POCBNP  CBG:  Recent Labs Lab 04/27/16 0259 04/27/16 0537 04/27/16 0728 04/27/16 1000 04/27/16 1141  GLUCAP 185* 132* 123* 131* 114*    Microbiology: Results for orders placed or performed during the hospital encounter of 04/21/16  Urine culture     Status: Abnormal   Collection Time: 04/21/16  9:19 AM  Result Value Ref Range Status   Specimen Description URINE, CLEAN CATCH  Final   Special Requests Normal  Final   Culture >=100,000 COLONIES/mL PROTEUS MIRABILIS (A)  Final   Report Status 04/23/2016 FINAL  Final   Organism ID, Bacteria PROTEUS MIRABILIS (A)  Final      Susceptibility   Proteus mirabilis - MIC*  AMPICILLIN <=2 SENSITIVE Sensitive     CEFAZOLIN 8 SENSITIVE Sensitive     CEFTRIAXONE <=1 SENSITIVE Sensitive     CIPROFLOXACIN <=0.25 SENSITIVE Sensitive     GENTAMICIN <=1 SENSITIVE Sensitive     IMIPENEM 4 SENSITIVE Sensitive     NITROFURANTOIN 256 RESISTANT Resistant     TRIMETH/SULFA <=20 SENSITIVE Sensitive     AMPICILLIN/SULBACTAM <=2 SENSITIVE Sensitive     PIP/TAZO <=4 SENSITIVE Sensitive     * >=100,000 COLONIES/mL PROTEUS MIRABILIS  MRSA PCR Screening     Status: None   Collection Time: 04/21/16  6:07 PM  Result Value Ref Range Status   MRSA by PCR NEGATIVE NEGATIVE Final    Comment:        The GeneXpert MRSA Assay (FDA approved for NASAL specimens only), is one component of a comprehensive MRSA colonization surveillance program. It is not intended to diagnose MRSA infection nor to guide or monitor treatment for MRSA infections.     Coagulation Studies: No results for input(s): LABPROT, INR  in the last 72 hours.  Urinalysis: No results for input(s): COLORURINE, LABSPEC, PHURINE, GLUCOSEU, HGBUR, BILIRUBINUR, KETONESUR, PROTEINUR, UROBILINOGEN, NITRITE, LEUKOCYTESUR in the last 72 hours.  Invalid input(s): APPERANCEUR    Imaging: Dg Chest 1 View  Result Date: 04/26/2016 CLINICAL DATA:  Short of breath for 2 days EXAM: CHEST 1 VIEW COMPARISON:  04/07/2016 FINDINGS: Cardiac silhouette is enlarged. There is bilateral pleural effusions and basilar atelectasis. Mild airspace disease at the lung bases. Upper lungs are relatively clear. Vascular stent noted in the LEFT axilla IMPRESSION: 1. No significant change. 2. Bibasilar effusions and airspace disease representing edema versus pneumonia. Electronically Signed   By: Suzy Bouchard M.D.   On: 04/26/2016 07:42     Medications:     . acidophilus  1 capsule Oral Daily  . albuterol  2.5 mg Inhalation Q6H  . amLODipine  10 mg Oral Daily  . atorvastatin  20 mg Oral QPM  . FLUoxetine  20 mg Oral Daily  . hydrALAZINE  25 mg Oral TID  . lipase/protease/amylase  24,000 Units Oral Q lunch  . lipase/protease/amylase  36,000 Units Oral BID  . loratadine  10 mg Oral Daily  . OLANZapine  5 mg Oral QHS  . pantoprazole  40 mg Oral BID AC  . pregabalin  25 mg Oral Daily  . rifaximin  550 mg Oral BID  . sevelamer carbonate  800 mg Oral TID WC   benzonatate, butalbital-acetaminophen-caffeine, guaiFENesin-dextromethorphan, haloperidol lactate, HYDROcodone-acetaminophen, ipratropium-albuterol, morphine injection, promethazine  Assessment/ Plan:  [name] is a 55 y.o. black female  with insulin dependent diabetes mellitus type II, hypertension, hyperlipidemia, diabetic retinopathy, diabetic neuropathy, diabetic gastroparesis, pancreatic insufficiency, depression, GERD. Renal masses - avoiding epo   MWF [facility] [address]   1. End Stage Renal Disease:   - Continue MWF schedule  2. Anemia of chronic kidney disease: Not getting  erythropoeitin stimulating agent as outpatient due to renal masses. However CT from 6/24 does not seem to show any renal masses.  - Consulted hematology: okay to use epo. Restarted - GI consulted: colonoscopy to be scheduled.  - PRBC transfusion 8/6.   3. Secondary Hyperparathyroidism:  - Continue sevelamer  4. Hypertension: at goal.   - amlodipine and hydralazine.   5. Encephalopathy: appreciate psych and neurology input. CT head on 7/18 negative.  - olanzapine   LOS: 6 Tamie Minteer 8/6/201712:14 PM

## 2016-04-27 NOTE — Consult Note (Signed)
Pt hgb 8.3 yesterday, she is dialysis on MWF so likely will need colonoscopy on Thursday.  Abd exam was neg today.  Will talk to Casey about doing this for me as I will be out of town.

## 2016-04-28 ENCOUNTER — Inpatient Hospital Stay: Payer: Medicare Other

## 2016-04-28 LAB — RENAL FUNCTION PANEL
Albumin: 2.5 g/dL — ABNORMAL LOW (ref 3.5–5.0)
Anion gap: 9 (ref 5–15)
BUN: 27 mg/dL — ABNORMAL HIGH (ref 6–20)
CO2: 28 mmol/L (ref 22–32)
Calcium: 8.3 mg/dL — ABNORMAL LOW (ref 8.9–10.3)
Chloride: 89 mmol/L — ABNORMAL LOW (ref 101–111)
Creatinine, Ser: 5.32 mg/dL — ABNORMAL HIGH (ref 0.44–1.00)
GFR calc Af Amer: 9 mL/min — ABNORMAL LOW (ref >60)
GFR calc non Af Amer: 8 mL/min — ABNORMAL LOW (ref >60)
Glucose, Bld: 119 mg/dL — ABNORMAL HIGH (ref 65–99)
Phosphorus: 2.4 mg/dL — ABNORMAL LOW (ref 2.5–4.6)
Potassium: 5.1 mmol/L (ref 3.5–5.1)
Sodium: 126 mmol/L — ABNORMAL LOW (ref 135–145)

## 2016-04-28 LAB — GLUCOSE, CAPILLARY
Glucose-Capillary: 109 mg/dL — ABNORMAL HIGH (ref 65–99)
Glucose-Capillary: 111 mg/dL — ABNORMAL HIGH (ref 65–99)
Glucose-Capillary: 112 mg/dL — ABNORMAL HIGH (ref 65–99)
Glucose-Capillary: 131 mg/dL — ABNORMAL HIGH (ref 65–99)
Glucose-Capillary: 150 mg/dL — ABNORMAL HIGH (ref 65–99)
Glucose-Capillary: 153 mg/dL — ABNORMAL HIGH (ref 65–99)
Glucose-Capillary: 163 mg/dL — ABNORMAL HIGH (ref 65–99)
Glucose-Capillary: 85 mg/dL (ref 65–99)

## 2016-04-28 LAB — CBC
HCT: 24.5 % — ABNORMAL LOW (ref 35.0–47.0)
Hemoglobin: 7.9 g/dL — ABNORMAL LOW (ref 12.0–16.0)
MCH: 24.1 pg — ABNORMAL LOW (ref 26.0–34.0)
MCHC: 32.4 g/dL (ref 32.0–36.0)
MCV: 74.4 fL — ABNORMAL LOW (ref 80.0–100.0)
Platelets: 239 10*3/uL (ref 150–440)
RBC: 3.29 MIL/uL — ABNORMAL LOW (ref 3.80–5.20)
RDW: 21.3 % — ABNORMAL HIGH (ref 11.5–14.5)
WBC: 16.5 10*3/uL — ABNORMAL HIGH (ref 3.6–11.0)

## 2016-04-28 LAB — INSULIN AND C-PEPTIDE, SERUM
C-Peptide: 1.7 ng/mL (ref 1.1–4.4)
Insulin: 2.4 u[IU]/mL — ABNORMAL LOW (ref 2.6–24.9)

## 2016-04-28 LAB — CREATININE, SERUM
Creatinine, Ser: 4.96 mg/dL — ABNORMAL HIGH (ref 0.44–1.00)
GFR calc Af Amer: 10 mL/min — ABNORMAL LOW (ref >60)
GFR calc non Af Amer: 9 mL/min — ABNORMAL LOW (ref >60)

## 2016-04-28 MED ORDER — EPOETIN ALFA 10000 UNIT/ML IJ SOLN
10000.0000 [IU] | INTRAMUSCULAR | Status: DC
  Administered 2016-04-28: 10000 [IU] via INTRAVENOUS

## 2016-04-28 MED ORDER — ALBUTEROL SULFATE (2.5 MG/3ML) 0.083% IN NEBU
2.5000 mg | INHALATION_SOLUTION | Freq: Three times a day (TID) | RESPIRATORY_TRACT | Status: DC
  Administered 2016-04-28 (×3): 2.5 mg via RESPIRATORY_TRACT
  Filled 2016-04-28 (×3): qty 3

## 2016-04-28 MED ORDER — BENZONATATE 100 MG PO CAPS: 100.0000 mg | ORAL_CAPSULE | Freq: Three times a day (TID) | ORAL | Status: DC | PRN

## 2016-04-28 MED ORDER — GUAIFENESIN 100 MG/5ML PO SOLN
5.0000 mL | ORAL | Status: DC | PRN
  Administered 2016-04-28: 100 mg via ORAL
  Filled 2016-04-28: qty 10

## 2016-04-28 MED ORDER — BUDESONIDE 0.25 MG/2ML IN SUSP
0.2500 mg | Freq: Two times a day (BID) | RESPIRATORY_TRACT | Status: DC
Start: 2016-04-28 — End: 2016-04-29
  Administered 2016-04-28: 20:00:00 0.25 mg via RESPIRATORY_TRACT
  Filled 2016-04-28: qty 2

## 2016-04-28 MED ORDER — ACETAMINOPHEN 325 MG PO TABS: 650.0000 mg | ORAL_TABLET | Freq: Four times a day (QID) | ORAL | Status: DC | PRN

## 2016-04-28 NOTE — Progress Notes (Signed)
Per MD patient will likely be ready for D/C tomorrow. Clinical Social Worker (CSW) contacted patient's son [name] and left a voicemail with bed offers. CSW will continue to follow and assist as needed.   [employer], LCSW [phone]

## 2016-04-28 NOTE — Progress Notes (Signed)
POST HD VITALS

## 2016-04-28 NOTE — Progress Notes (Signed)
Patient ID: RANIYAH AXFORD, female   DOB: [birth year], 55 y.o.   MRN: [id number]  [facility] PROGRESS NOTE  ADRIAUNNA SUNDET [id number] DOB: 1961-05-01 DOA: 04/21/2016 PCP: Ellamae Sia, MD  HPI/Subjective: Patient states that she's been coughing all night and hardly gotten any sleep. She vomited this morning. She still has some headache.  Objective: Vitals:   04/28/16 0359 04/28/16 1259  BP: (!) 145/53 (!) 147/54  Pulse: 73 91  Resp: 16 18  Temp: 97.8 F (36.6 C) 98.8 F (37.1 C)    Filed Weights   04/23/16 1257 04/25/16 0919 04/25/16 1230  Weight: 72 kg (158 lb 11.7 oz) 75.1 kg (165 lb 9.1 oz) 73.6 kg (162 lb 4.1 oz)    ROS: Review of Systems  Constitutional: Negative for chills and fever.  Eyes: Negative for blurred vision.  Respiratory: Positive for cough and shortness of breath.   Cardiovascular: Negative for chest pain.  Gastrointestinal: Positive for nausea and vomiting. Negative for abdominal pain, constipation and diarrhea.  Genitourinary: Negative for dysuria.  Musculoskeletal: Positive for back pain and joint pain.  Neurological: Positive for dizziness and headaches.   Exam: Physical Exam  Constitutional: She is oriented to person, place, and time.  HENT:  Nose: No mucosal edema.  Mouth/Throat: No oropharyngeal exudate or posterior oropharyngeal edema.  Eyes: Conjunctivae, EOM and lids are normal. Pupils are equal, round, and reactive to light.  Neck: No JVD present. Carotid bruit is not present. No edema present. No thyroid mass and no thyromegaly present.  Cardiovascular: S1 normal and S2 normal.  Exam reveals no gallop.   No murmur heard. Pulses:      Dorsalis pedis pulses are 2+ on the right side, and 2+ on the left side.  Respiratory: No respiratory distress. She has no wheezes. She has no rhonchi. She has no rales.  GI: Soft. Bowel sounds are normal. There is no tenderness.  Musculoskeletal:       Right shoulder: She exhibits no swelling.   Lymphadenopathy:    She has no cervical adenopathy.  Neurological: She is alert and oriented to person, place, and time. No cranial nerve deficit.  Skin: Skin is warm. No rash noted. Nails show no clubbing.  Psychiatric: She has a normal mood and affect.      Data Reviewed: Basic Metabolic Panel:  Recent Labs Lab 04/21/16 1731 04/25/16 0930 04/26/16 0437 04/28/16 0506  NA  --  124* 129*  --   K  --  4.7 4.3  --   CL  --  87* 90*  --   CO2  --  28 29  --   GLUCOSE  --  130* 121*  --   BUN  --  23* 12  --   CREATININE 5.31* 4.77* 3.27* 4.96*  CALCIUM  --  8.4* 8.1*  --   PHOS  --  3.1  --   --    Liver Function Tests:  Recent Labs Lab 04/25/16 0930  ALBUMIN 2.8*    Recent Labs Lab 04/26/16 0437  LIPASE 10*    Recent Labs Lab 04/23/16 1459  AMMONIA 39*   CBC:  Recent Labs Lab 04/21/16 1731 04/25/16 0930 04/26/16 0437 04/26/16 2016  WBC 6.9 8.6 6.5  --   HGB 9.3* 7.1* 6.7* 8.3*  HCT 28.0* 21.3* 20.2* 25.2*  MCV 73.5* 72.6* 74.1*  --   PLT 254 198 199  --       CBG:  Recent Labs Lab 04/28/16 0152  04/28/16 0358 04/28/16 0549 04/28/16 0759 04/28/16 1030  GLUCAP 150* 153* 131* 109* 111*    Recent Results (from the past 240 hour(s))  Urine culture     Status: Abnormal   Collection Time: 04/21/16  9:19 AM  Result Value Ref Range Status   Specimen Description URINE, CLEAN CATCH  Final   Special Requests Normal  Final   Culture >=100,000 COLONIES/mL PROTEUS MIRABILIS (A)  Final   Report Status 04/23/2016 FINAL  Final   Organism ID, Bacteria PROTEUS MIRABILIS (A)  Final      Susceptibility   Proteus mirabilis - MIC*    AMPICILLIN <=2 SENSITIVE Sensitive     CEFAZOLIN 8 SENSITIVE Sensitive     CEFTRIAXONE <=1 SENSITIVE Sensitive     CIPROFLOXACIN <=0.25 SENSITIVE Sensitive     GENTAMICIN <=1 SENSITIVE Sensitive     IMIPENEM 4 SENSITIVE Sensitive     NITROFURANTOIN 256 RESISTANT Resistant     TRIMETH/SULFA <=20 SENSITIVE Sensitive      AMPICILLIN/SULBACTAM <=2 SENSITIVE Sensitive     PIP/TAZO <=4 SENSITIVE Sensitive     * >=100,000 COLONIES/mL PROTEUS MIRABILIS  MRSA PCR Screening     Status: None   Collection Time: 04/21/16  6:07 PM  Result Value Ref Range Status   MRSA by PCR NEGATIVE NEGATIVE Final    Comment:        The GeneXpert MRSA Assay (FDA approved for NASAL specimens only), is one component of a comprehensive MRSA colonization surveillance program. It is not intended to diagnose MRSA infection nor to guide or monitor treatment for MRSA infections.      Studies: Dg Chest 1 View  Result Date: 04/28/2016 CLINICAL DATA:  Pt admitted 7/31 with intermittent nausea and vomiting, shortness of breath; h/o chf, copd. EXAM: CHEST 1 VIEW COMPARISON:  04/26/2016 FINDINGS: Cardiac silhouette is enlarged. There is central vascular congestion as well as bilateral interstitial thickening and hazy perihilar and more confluent lung base opacity. There are bilateral pleural effusions, mildly increased from the prior study. No pneumothorax. Left sided vascular stent is stable. IMPRESSION: 1. Mild worsening of probable congestive heart failure. Central vascular congestion and interstitial thickening appears mildly increased from the previous exam. Pleural effusions have also mildly increased. Electronically Signed   By: Lajean Manes M.D.   On: 04/28/2016 11:56    Scheduled Meds: . acidophilus  1 capsule Oral Daily  . albuterol  2.5 mg Inhalation TID  . amLODipine  10 mg Oral Daily  . atorvastatin  20 mg Oral QPM  . budesonide (PULMICORT) nebulizer solution  0.25 mg Nebulization BID  . epoetin (EPOGEN/PROCRIT) injection  10,000 Units Intravenous Q M,W,F-HD  . FLUoxetine  20 mg Oral Daily  . hydrALAZINE  25 mg Oral TID  . lipase/protease/amylase  24,000 Units Oral Q lunch  . lipase/protease/amylase  36,000 Units Oral BID  . loratadine  10 mg Oral Daily  . OLANZapine  5 mg Oral QHS  . pantoprazole  40 mg Oral BID AC  .  pregabalin  25 mg Oral Daily  . rifaximin  550 mg Oral BID  . sevelamer carbonate  800 mg Oral TID WC    Assessment/Plan:  1. Acute encephalopathy. Likely multifactorial with hypoglycemia, hypoxia and episodes of psychosis.Last few days this has improved. 2. Anemia. Likely acute on chronic. Patient transfused 1 unit of packed red blood cells with good response. Hold aspirin and Plavix. Recent endoscopy showing esophagitis and mild gastritis. GI to do colonoscopy once off Plavix for 7  days. 3. Hypoglycemia on D10 drip for a long period of time. Sugars have held off the drip at this point. Change fingersticks to every before meals and daily at bedtime. 4. Acute cystitis. Patient finished course of Ceftin 5. Abdominal pain nausea vomiting. Patient did have some abdominal pain today in the epigastric area. As per the nurse the patient is taking nausea medications. 6. End-stage renal disease. Asked nephrology to take off more fluid today with fluid overload. 7. Hyponatremia. Dialysis to help with electrolytes. 8. Hallucination with history of depression. Could've worsened with urinary tract infection. Seems better now. Could also worsen with hypoglycemic episodes. 9. Fluid overload, cough, acute diastolic congestive heart failure. I asked nephrology to take off more fluid today with dialysis.  Code Status:     Code Status Orders        Start     Ordered   04/21/16 1559  Full code  Continuous     04/21/16 1559    Code Status History    Date Active Date Inactive Code Status Order ID Comments User Context   04/21/2016  3:59 PM 04/21/2016  5:17 PM Full Code [id number]  Nicholes Mango, MD Inpatient   04/08/2016  2:15 PM 04/11/2016  7:47 PM Full Code [id number]  Theodoro Grist, MD Inpatient   03/15/2016  1:22 PM 03/20/2016 12:19 AM Full Code [id number]  Bettey Costa, MD Inpatient   02/16/2016  2:09 PM 02/19/2016 10:38 PM Full Code [id number]  Bettey Costa, MD ED   12/17/2015 11:39 AM 12/21/2015 10:09 PM Full Code  [id number]  Dustin Flock, MD ED   11/12/2015  4:02 PM 12/01/2015  7:43 PM Full Code [id number]  Bettey Costa, MD Inpatient   10/07/2015  9:37 AM 10/12/2015  7:59 PM Full Code [id number]  Fritzi Mandes, MD ED   10/03/2015  3:55 AM 10/05/2015 10:30 PM Full Code [id number]  Lance Coon, MD Inpatient   08/31/2015  4:00 PM 09/03/2015 10:09 PM Full Code [id number]  Demetrios Loll, MD ED   07/26/2015  3:28 PM 07/26/2015  9:36 PM Full Code [id number]  Dionisio David, MD Inpatient   07/22/2015  5:36 PM 07/26/2015  3:28 PM Full Code [id number]  Vaughan Basta, MD Inpatient   06/05/2015  8:39 PM 06/07/2015  8:03 PM Full Code [id number]  Bettey Costa, MD Inpatient   05/21/2015  9:42 PM 05/24/2015  4:26 PM Full Code [id number]  Henreitta Leber, MD Inpatient   04/24/2015  8:55 AM 04/26/2015 10:11 PM Full Code [id number]  Demetrios Loll, MD Inpatient   04/01/2015  3:58 PM 04/05/2015  8:07 PM Full Code [id number]  Epifanio Lesches, MD ED   03/06/2015  8:05 AM 03/09/2015  9:11 PM Full Code [id number]  Juluis Mire, MD Inpatient   02/19/2015  4:14 PM 02/24/2015  5:41 PM Full Code [id number]  Aldean Jewett, MD Inpatient    Advance Directive Documentation   Flowsheet Row Most Recent Value  Type of Advance Directive  Healthcare Power of Attorney, Living will  Pre-existing out of facility DNR order (yellow form or pink MOST form)  No data  "MOST" Form in Place?  No data     Family Communication: Spoke with son on the phone today Disposition Plan: We will need rehabilitation upon getting out of the hospital.  Consultants:  Nephrology  GI  Neuro  Time spent: 27 minutes  [place]

## 2016-04-28 NOTE — Progress Notes (Signed)
Subjective:   Patient seen during dialysis Tolerating well    HEMODIALYSIS FLOWSHEET:  Blood Flow Rate (mL/min): 400 mL/min Arterial Pressure (mmHg): -110 mmHg Venous Pressure (mmHg): 170 mmHg Transmembrane Pressure (mmHg): 40 mmHg Ultrafiltration Rate (mL/min): 670 mL/min Dialysate Flow Rate (mL/min): 600 ml/min Conductivity: Machine : 14 Conductivity: Machine : 14 Dialysis Fluid Bolus: Normal Saline Bolus Amount (mL): 250 mL (prime) Dialysate Change:  (3k) Intra-Hemodialysis Comments: 125. Resting    Objective:  Vital signs in last 24 hours:  Temp:  [97.8 F (36.6 C)-99.1 F (37.3 C)] 99.1 F (37.3 C) (08/07 1515) Pulse Rate:  [73-91] 85 (08/07 1530) Resp:  [16-25] 19 (08/07 1530) BP: (144-162)/(50-60) 155/60 (08/07 1530) SpO2:  [98 %-100 %] 100 % (08/07 1530) Weight:  [78.6 kg (173 lb 4.5 oz)] 78.6 kg (173 lb 4.5 oz) (08/07 1515)  Weight change:  Filed Weights   04/25/16 0919 04/25/16 1230 04/28/16 1515  Weight: 75.1 kg (165 lb 9.1 oz) 73.6 kg (162 lb 4.1 oz) 78.6 kg (173 lb 4.5 oz)    Intake/Output:    Intake/Output Summary (Last 24 hours) at 04/28/16 1609 Last data filed at 04/28/16 0900  Gross per 24 hour  Intake              480 ml  Output                0 ml  Net              480 ml     Physical Exam: General: No acute distress, lying in the bed   HEENT  moist oral mucous membranes   Neck Supple   Pulm/lungs Normal breathing effort, oxygen by nasal cannula   CVS/Heart Regular rhythm, no rub or gallop   Abdomen:  Soft, nontender   Extremities: Pitting edema over right arm   Neurologic: Alert, able to answer questions   Skin: No acute rashes   Access: AV fistula        Basic Metabolic Panel:   Recent Labs Lab 04/21/16 1731 04/25/16 0930 04/26/16 0437 04/28/16 0506 04/28/16 1528  NA  --  124* 129*  --  126*  K  --  4.7 4.3  --  5.1  CL  --  87* 90*  --  89*  CO2  --  28 29  --  28  GLUCOSE  --  130* 121*  --  119*  BUN  --  23*  12  --  27*  CREATININE 5.31* 4.77* 3.27* 4.96* 5.32*  CALCIUM  --  8.4* 8.1*  --  8.3*  PHOS  --  3.1  --   --  2.4*     CBC:  Recent Labs Lab 04/21/16 1731 04/25/16 0930 04/26/16 0437 04/26/16 2016 04/28/16 1528  WBC 6.9 8.6 6.5  --  16.5*  HGB 9.3* 7.1* 6.7* 8.3* 7.9*  HCT 28.0* 21.3* 20.2* 25.2* 24.5*  MCV 73.5* 72.6* 74.1*  --  74.4*  PLT 254 198 199  --  239      Microbiology:  Recent Results (from the past 720 hour(s))  Blood culture (routine x 2)     Status: None   Collection Time: 04/08/16  8:06 AM  Result Value Ref Range Status   Specimen Description BLOOD  Final   Special Requests BOTTLES DRAWN AEROBIC AND ANAEROBIC [place]  Final   Culture NO GROWTH 5 DAYS  Final   Report Status 04/13/2016 FINAL  Final  Urine culture  Status: None   Collection Time: 04/10/16 11:14 AM  Result Value Ref Range Status   Specimen Description URINE, RANDOM  Final   Special Requests NONE  Final   Culture NO GROWTH Performed at [facility]   Final   Report Status 04/12/2016 FINAL  Final  Urine culture     Status: Abnormal   Collection Time: 04/21/16  9:19 AM  Result Value Ref Range Status   Specimen Description URINE, CLEAN CATCH  Final   Special Requests Normal  Final   Culture >=100,000 COLONIES/mL PROTEUS MIRABILIS (A)  Final   Report Status 04/23/2016 FINAL  Final   Organism ID, Bacteria PROTEUS MIRABILIS (A)  Final      Susceptibility   Proteus mirabilis - MIC*    AMPICILLIN <=2 SENSITIVE Sensitive     CEFAZOLIN 8 SENSITIVE Sensitive     CEFTRIAXONE <=1 SENSITIVE Sensitive     CIPROFLOXACIN <=0.25 SENSITIVE Sensitive     GENTAMICIN <=1 SENSITIVE Sensitive     IMIPENEM 4 SENSITIVE Sensitive     NITROFURANTOIN 256 RESISTANT Resistant     TRIMETH/SULFA <=20 SENSITIVE Sensitive     AMPICILLIN/SULBACTAM <=2 SENSITIVE Sensitive     PIP/TAZO <=4 SENSITIVE Sensitive     * >=100,000 COLONIES/mL PROTEUS MIRABILIS  MRSA PCR Screening     Status: None    Collection Time: 04/21/16  6:07 PM  Result Value Ref Range Status   MRSA by PCR NEGATIVE NEGATIVE Final    Comment:        The GeneXpert MRSA Assay (FDA approved for NASAL specimens only), is one component of a comprehensive MRSA colonization surveillance program. It is not intended to diagnose MRSA infection nor to guide or monitor treatment for MRSA infections.     Coagulation Studies: No results for input(s): LABPROT, INR in the last 72 hours.  Urinalysis: No results for input(s): COLORURINE, LABSPEC, PHURINE, GLUCOSEU, HGBUR, BILIRUBINUR, KETONESUR, PROTEINUR, UROBILINOGEN, NITRITE, LEUKOCYTESUR in the last 72 hours.  Invalid input(s): APPERANCEUR    Imaging: Dg Chest 1 View  Result Date: 04/28/2016 CLINICAL DATA:  Pt admitted 7/31 with intermittent nausea and vomiting, shortness of breath; h/o chf, copd. EXAM: CHEST 1 VIEW COMPARISON:  04/26/2016 FINDINGS: Cardiac silhouette is enlarged. There is central vascular congestion as well as bilateral interstitial thickening and hazy perihilar and more confluent lung base opacity. There are bilateral pleural effusions, mildly increased from the prior study. No pneumothorax. Left sided vascular stent is stable. IMPRESSION: 1. Mild worsening of probable congestive heart failure. Central vascular congestion and interstitial thickening appears mildly increased from the previous exam. Pleural effusions have also mildly increased. Electronically Signed   By: Lajean Manes M.D.   On: 04/28/2016 11:56     Medications:     . acidophilus  1 capsule Oral Daily  . albuterol  2.5 mg Inhalation TID  . amLODipine  10 mg Oral Daily  . atorvastatin  20 mg Oral QPM  . budesonide (PULMICORT) nebulizer solution  0.25 mg Nebulization BID  . epoetin (EPOGEN/PROCRIT) injection  10,000 Units Intravenous Q M,W,F-HD  . FLUoxetine  20 mg Oral Daily  . hydrALAZINE  25 mg Oral TID  . lipase/protease/amylase  24,000 Units Oral Q lunch  .  lipase/protease/amylase  36,000 Units Oral BID  . loratadine  10 mg Oral Daily  . OLANZapine  5 mg Oral QHS  . pantoprazole  40 mg Oral BID AC  . pregabalin  25 mg Oral Daily  . rifaximin  550 mg Oral  BID  . sevelamer carbonate  800 mg Oral TID WC   acetaminophen, benzonatate, butalbital-acetaminophen-caffeine, guaiFENesin, guaiFENesin-dextromethorphan, haloperidol lactate, HYDROcodone-acetaminophen, ipratropium-albuterol, morphine injection, promethazine  Assessment/ Plan:  55 y.o. female with insulin dependent diabetes mellitus type II, hypertension, hyperlipidemia, diabetic retinopathy, diabetic neuropathy, diabetic gastroparesis, pancreatic insufficiency, depression, GERD.     MWF [facility] [place]   1. End Stage Renal Disease:   - Continue MWF schedule Patient seen during dialysis Tolerating well   2. Anemia of chronic kidney disease: Not getting erythropoeitin stimulating agent as outpatient due to renal masses. However CT from 6/24 does not seem to show any renal masses.  - Consulted hematology: okay to use epo. Restarted - GI consulted: colonoscopy to be scheduled.  - PRBC transfusion 8/6.   3. Secondary Hyperparathyroidism:  - Continue sevelamer     LOS: 7 Aster Eckrich 8/7/20174:09 PM

## 2016-04-28 NOTE — Progress Notes (Signed)
Clinical Education officer, museum (CSW) attempted to meet with patient's son [name] to give bed offers however he was not at bedside. CSW contacted son again this afternoon. Son chose [facility]. [place] admissions coordinator at [facility] is aware of above.  [employer], LCSW [phone]

## 2016-04-28 NOTE — Progress Notes (Signed)
OT Cancellation Note  Patient Details Name: [name] MRN: [id number] DOB: [birth year]   Cancelled Treatment:    Reason Eval/Treat Not Completed: Patient declined, no reason specified Pt stated she was too tired and her "eyes were too heavy to do anything today".  Pt to go to dialysis later today per NSG.  Will attempt again tomorrow.  Chrys Racer, OTR/L ascom [phone] 04/28/16, 10:54 AM

## 2016-04-28 NOTE — Care Management Important Message (Signed)
Important Message  Patient Details  Name: [name] MRN: [id number] Date of Birth: 09-Apr-1961   Medicare Important Message Given:  Yes    Shelbie Ammons, RN 04/28/2016, 7:58 AM

## 2016-04-28 NOTE — Progress Notes (Signed)
Pre dialysis  

## 2016-04-28 NOTE — Progress Notes (Signed)
PRE HD INFO 

## 2016-04-28 NOTE — Clinical Social Work Placement (Signed)
   CLINICAL SOCIAL WORK PLACEMENT  NOTE  Date:  04/28/2016  Patient Details  Name: [name] MRN: [id number] Date of Birth: 08-07-1961  Clinical Social Work is seeking post-discharge placement for this patient at the   level of care (*CSW will initial, date and re-position this form in  chart as items are completed):  Yes   Patient/family provided with [place] Work Department's list of facilities offering this level of care within the geographic area requested by the patient (or if unable, by the patient's family).  Yes   Patient/family informed of their freedom to choose among providers that offer the needed level of care, that participate in Medicare, Medicaid or managed care program needed by the patient, have an available bed and are willing to accept the patient.  Yes   Patient/family informed of [place]'s ownership interest in [facility] and [facility], as well as of the fact that they are under no obligation to receive care at these facilities.  PASRR submitted to EDS on       PASRR number received on       Existing PASRR number confirmed on 04/25/16     FL2 transmitted to all facilities in geographic area requested by pt/family on 04/25/16     FL2 transmitted to all facilities within larger geographic area on       Patient informed that his/her managed care company has contracts with or will negotiate with certain facilities, including the following:        Yes   Patient/family informed of bed offers received.  Patient chooses bed at  [facility] )     Physician recommends and patient chooses bed at      Patient to be transferred to   on  .  Patient to be transferred to facility by       Patient family notified on   of transfer.  Name of family member notified:        PHYSICIAN       Additional Comment:    _______________________________________________ Imari Reen, Veronia Beets, LCSW 04/28/2016, 2:57 PM

## 2016-04-28 NOTE — Progress Notes (Signed)
This note also relates to the following rows which could not be included: Pulse Rate - Cannot attach notes to unvalidated device data Resp - Cannot attach notes to unvalidated device data SpO2 - Cannot attach notes to unvalidated device data  Hd start

## 2016-04-28 NOTE — Progress Notes (Signed)
END OF HD 

## 2016-04-28 NOTE — Progress Notes (Signed)
[name] MRN: [id number] DOB: 02/05/1961   Cancelled Treatment:    Reason Eval/Treat Not Completed: Patient at procedure or test/unavailable (Treatment session attempted; patient currently leaving unit for dialysis.  Will re-attempt at later time/date as medically appropriate and available.)   Arthella Headings H. Owens Shark, PT, DPT, NCS 04/28/16, 3:26 PM [phone]

## 2016-04-28 NOTE — Progress Notes (Signed)
POST HD ASSESSMENT

## 2016-04-29 LAB — GLUCOSE, CAPILLARY
Glucose-Capillary: 117 mg/dL — ABNORMAL HIGH (ref 65–99)
Glucose-Capillary: 159 mg/dL — ABNORMAL HIGH (ref 65–99)

## 2016-04-29 MED ORDER — MECLIZINE HCL 12.5 MG PO TABS: 12.5000 mg | ORAL_TABLET | Freq: Three times a day (TID) | ORAL | 0 refills | Status: DC | PRN

## 2016-04-29 MED ORDER — HYDROCODONE-ACETAMINOPHEN 5-325 MG PO TABS: 1.0000 | ORAL_TABLET | Freq: Four times a day (QID) | ORAL | 0 refills | Status: DC | PRN

## 2016-04-29 MED ORDER — ALBUTEROL SULFATE (2.5 MG/3ML) 0.083% IN NEBU: 2.5000 mg | INHALATION_SOLUTION | Freq: Four times a day (QID) | RESPIRATORY_TRACT | Status: DC | PRN

## 2016-04-29 MED ORDER — BUDESONIDE 0.25 MG/2ML IN SUSP: 0.2500 mg | Freq: Two times a day (BID) | RESPIRATORY_TRACT | Status: DC | PRN

## 2016-04-29 MED ORDER — MECLIZINE HCL 25 MG PO TABS: 12.5000 mg | ORAL_TABLET | Freq: Three times a day (TID) | ORAL | Status: DC | PRN

## 2016-04-29 MED ORDER — OLANZAPINE 5 MG PO TABS: 2.5000 mg | ORAL_TABLET | Freq: Every day | ORAL | 0 refills | Status: DC

## 2016-04-29 NOTE — Progress Notes (Signed)
Subjective:   Patient feels great today No nausea or vomiting No shortness of breath This is the most alert she has been in awhile Sitting up in chair, normal conversation   Objective:  Vital signs in last 24 hours:  Temp:  [98 F (36.7 C)-99.1 F (37.3 C)] 98 F (36.7 C) (08/08 1333) Pulse Rate:  [79-89] 79 (08/08 1333) Resp:  [14-20] 20 (08/08 1333) BP: (106-162)/(43-66) 162/59 (08/08 1333) SpO2:  [100 %] 100 % (08/08 1333) Weight:  [75.3 kg (166 lb 0.1 oz)] 75.3 kg (166 lb 0.1 oz) (08/07 1825)  Weight change:  Filed Weights   04/25/16 1230 04/28/16 1515 04/28/16 1825  Weight: 73.6 kg (162 lb 4.1 oz) 78.6 kg (173 lb 4.5 oz) 75.3 kg (166 lb 0.1 oz)    Intake/Output:    Intake/Output Summary (Last 24 hours) at 04/29/16 1742 Last data filed at 04/29/16 1500  Gross per 24 hour  Intake              598 ml  Output             2500 ml  Net            -1902 ml     Physical Exam: General: No acute distress, sitting up in chair, feels well  HEENT  moist oral mucous membranes   Neck Supple   Pulm/lungs Normal breathing effort, oxygen by nasal cannula   CVS/Heart Regular rhythm, no rub or gallop   Abdomen:  Soft, nontender   Extremities: Pitting edema over right arm   Neurologic: Alert, able to answer questions   Skin: No acute rashes   Access: AV fistula        Basic Metabolic Panel:   Recent Labs Lab 04/25/16 0930 04/26/16 0437 04/28/16 0506 04/28/16 1528  NA 124* 129*  --  126*  K 4.7 4.3  --  5.1  CL 87* 90*  --  89*  CO2 28 29  --  28  GLUCOSE 130* 121*  --  119*  BUN 23* 12  --  27*  CREATININE 4.77* 3.27* 4.96* 5.32*  CALCIUM 8.4* 8.1*  --  8.3*  PHOS 3.1  --   --  2.4*     CBC:  Recent Labs Lab 04/25/16 0930 04/26/16 0437 04/26/16 2016 04/28/16 1528  WBC 8.6 6.5  --  16.5*  HGB 7.1* 6.7* 8.3* 7.9*  HCT 21.3* 20.2* 25.2* 24.5*  MCV 72.6* 74.1*  --  74.4*  PLT 198 199  --  239      Microbiology:  Recent Results (from the past  720 hour(s))  Blood culture (routine x 2)     Status: None   Collection Time: 04/08/16  8:06 AM  Result Value Ref Range Status   Specimen Description BLOOD  Final   Special Requests BOTTLES DRAWN AEROBIC AND ANAEROBIC [place]  Final   Culture NO GROWTH 5 DAYS  Final   Report Status 04/13/2016 FINAL  Final  Urine culture     Status: None   Collection Time: 04/10/16 11:14 AM  Result Value Ref Range Status   Specimen Description URINE, RANDOM  Final   Special Requests NONE  Final   Culture NO GROWTH Performed at [facility]   Final   Report Status 04/12/2016 FINAL  Final  Urine culture     Status: Abnormal   Collection Time: 04/21/16  9:19 AM  Result Value Ref Range Status   Specimen Description URINE, CLEAN CATCH  Final   Special Requests Normal  Final   Culture >=100,000 COLONIES/mL PROTEUS MIRABILIS (A)  Final   Report Status 04/23/2016 FINAL  Final   Organism ID, Bacteria PROTEUS MIRABILIS (A)  Final      Susceptibility   Proteus mirabilis - MIC*    AMPICILLIN <=2 SENSITIVE Sensitive     CEFAZOLIN 8 SENSITIVE Sensitive     CEFTRIAXONE <=1 SENSITIVE Sensitive     CIPROFLOXACIN <=0.25 SENSITIVE Sensitive     GENTAMICIN <=1 SENSITIVE Sensitive     IMIPENEM 4 SENSITIVE Sensitive     NITROFURANTOIN 256 RESISTANT Resistant     TRIMETH/SULFA <=20 SENSITIVE Sensitive     AMPICILLIN/SULBACTAM <=2 SENSITIVE Sensitive     PIP/TAZO <=4 SENSITIVE Sensitive     * >=100,000 COLONIES/mL PROTEUS MIRABILIS  MRSA PCR Screening     Status: None   Collection Time: 04/21/16  6:07 PM  Result Value Ref Range Status   MRSA by PCR NEGATIVE NEGATIVE Final    Comment:        The GeneXpert MRSA Assay (FDA approved for NASAL specimens only), is one component of a comprehensive MRSA colonization surveillance program. It is not intended to diagnose MRSA infection nor to guide or monitor treatment for MRSA infections.     Coagulation Studies: No results for input(s): LABPROT,  INR in the last 72 hours.  Urinalysis: No results for input(s): COLORURINE, LABSPEC, PHURINE, GLUCOSEU, HGBUR, BILIRUBINUR, KETONESUR, PROTEINUR, UROBILINOGEN, NITRITE, LEUKOCYTESUR in the last 72 hours.  Invalid input(s): APPERANCEUR    Imaging: Dg Chest 1 View  Result Date: 04/28/2016 CLINICAL DATA:  Pt admitted 7/31 with intermittent nausea and vomiting, shortness of breath; h/o chf, copd. EXAM: CHEST 1 VIEW COMPARISON:  04/26/2016 FINDINGS: Cardiac silhouette is enlarged. There is central vascular congestion as well as bilateral interstitial thickening and hazy perihilar and more confluent lung base opacity. There are bilateral pleural effusions, mildly increased from the prior study. No pneumothorax. Left sided vascular stent is stable. IMPRESSION: 1. Mild worsening of probable congestive heart failure. Central vascular congestion and interstitial thickening appears mildly increased from the previous exam. Pleural effusions have also mildly increased. Electronically Signed   By: Lajean Manes M.D.   On: 04/28/2016 11:56     Medications:     . acidophilus  1 capsule Oral Daily  . amLODipine  10 mg Oral Daily  . atorvastatin  20 mg Oral QPM  . FLUoxetine  20 mg Oral Daily  . hydrALAZINE  25 mg Oral TID  . lipase/protease/amylase  24,000 Units Oral Q lunch  . lipase/protease/amylase  36,000 Units Oral BID  . loratadine  10 mg Oral Daily  . OLANZapine  5 mg Oral QHS  . pantoprazole  40 mg Oral BID AC  . pregabalin  25 mg Oral Daily  . rifaximin  550 mg Oral BID  . sevelamer carbonate  800 mg Oral TID WC   acetaminophen, benzonatate, budesonide (PULMICORT) nebulizer solution, butalbital-acetaminophen-caffeine, guaiFENesin, guaiFENesin-dextromethorphan, haloperidol lactate, HYDROcodone-acetaminophen, ipratropium-albuterol, meclizine, promethazine  Assessment/ Plan:  55 y.o. female with insulin dependent diabetes mellitus type II, hypertension, hyperlipidemia, diabetic retinopathy,  diabetic neuropathy, diabetic gastroparesis, pancreatic insufficiency, depression, GERD.     MWF [facility]   1. End Stage Renal Disease:   - Continue MWF schedule Patient expected to be discharged today.  She will continue her routine dialysis as outpatient  2. Anemia of chronic kidney disease:  - Consulted hematology: okay to use epo. Restarted - GI consulted: colonoscopy to  be scheduled s outpatient - PRBC transfusion 8/6.   3. Secondary Hyperparathyroidism:  - Continue sevelamer     LOS: 8 Dyron Kawano 8/8/20175:42 PM

## 2016-04-29 NOTE — Progress Notes (Signed)
Report called to Barnetta Chapel at [facility] [phone]). Prescriptions in d/c packet. Patient to be transported by EMS. Son aware.

## 2016-04-29 NOTE — Clinical Social Work Placement (Signed)
   CLINICAL SOCIAL WORK PLACEMENT  NOTE  Date:  04/29/2016  Patient Details  Name: [name] MRN: [id number] Date of Birth: 02-24-1961  Clinical Social Work is seeking post-discharge placement for this patient at the   level of care (*CSW will initial, date and re-position this form in  chart as items are completed):  Yes   Patient/family provided with [place] Work Department's list of facilities offering this level of care within the geographic area requested by the patient (or if unable, by the patient's family).  Yes   Patient/family informed of their freedom to choose among providers that offer the needed level of care, that participate in Medicare, Medicaid or managed care program needed by the patient, have an available bed and are willing to accept the patient.  Yes   Patient/family informed of 's ownership interest in [facility] and [facility], as well as of the fact that they are under no obligation to receive care at these facilities.  PASRR submitted to EDS on       PASRR number received on       Existing PASRR number confirmed on 04/25/16     FL2 transmitted to all facilities in geographic area requested by pt/family on 04/25/16     FL2 transmitted to all facilities within larger geographic area on       Patient informed that his/her managed care company has contracts with or will negotiate with certain facilities, including the following:        Yes   Patient/family informed of bed offers received.  Patient chooses bed at  [facility] )     Physician recommends and patient chooses bed at      Patient to be transferred to  [facility] ) on 04/29/16.  Patient to be transferred to facility by  [facility] )     Patient family notified on 04/29/16 of transfer.  Name of family member notified:   (Patient's son [name] is aware of D/C today. )     PHYSICIAN       Additional Comment:     _______________________________________________ Chandi Nicklin, Veronia Beets, LCSW 04/29/2016, 2:41 PM

## 2016-04-29 NOTE — Discharge Summary (Signed)
[facility] NAME: [name]    MR#:  [id number]  DATE OF BIRTH:  [birth year]  DATE OF ADMISSION:  04/21/2016 ADMITTING PHYSICIAN: Nicholes Mango, MD  DATE OF DISCHARGE: 04/29/2016  PRIMARY CARE PHYSICIAN: Ellamae Sia, MD    ADMISSION DIAGNOSIS:  TIA (transient ischemic attack) [G45.9] Paresthesia [R20.2] Acute urinary tract infection [N39.0] Nausea vomiting and diarrhea [R11.2, R19.7]  DISCHARGE DIAGNOSIS:  Principal Problem:   Depression, major, recurrent, severe with psychosis ([place]) Active Problems:   TIA (transient ischemic attack)   SECONDARY DIAGNOSIS:   Past Medical History:  Diagnosis Date  . Anemia   . Anxiety   . Arthritis   . Asthma   . chronic diastolic CHF [id number]  . Collagen vascular disease ([place])   . COPD (chronic obstructive pulmonary disease) ([place])   . Coronary artery disease    a. cath 2013: stenting to RCA (report not available); b. cath 2014: LM nl, pLAD 40%, mLAD nl, ost LCx 40%, mid LCx nl, pRCA 30% @ site of prior stent, mRCA 50%  . Depression   . Diabetes mellitus without complication ([place])   . Diabetic neuropathy ([place])   . dialysis 2006  . ESRD (end stage renal disease) on dialysis ([place])    M-W-F  . GERD (gastroesophageal reflux disease)   . Headache   . Hx of pancreatitis 2015  . Hypertension   . Mitral regurgitation    a. echo 10/2013: EF 62%, noWMA, mildly dilated LA, mild to mod MR/TR, GR1DD  . Myocardial infarction ([place])   . Pneumonia   . Renal insufficiency     HOSPITAL COURSE:   1. Acute encephalopathy. Likely multifactorial with hypoglycemia, hypoxia and episodes of psychosis. She is back to baseline mental status at this point. I doubt this was a TIA or stroke. 2. Anemia likely acute on chronic. Patient transfuse 1 unit of packed red blood cells with good response. Hold aspirin and Plavix. Recent endoscopy showing esophagitis and mild gastritis. GI plan to do an endoscopy once  off Plavix for 7 days. This will be done as outpatient. I spoke with Dr. Vira Agar and he would like to follow the patient up next Tuesday at 4 PM to set up colonoscopy 3. Prolonged hypoglycemia. The patient was on D10 drip for a long period of time. Sugars have held off the drip at this point. Continue checking fingersticks every before meals and daily at bedtime and anytime the patient feels funny. 4. Acute cystitis. Patient finished course of Ceftin 5. Abdominal pain nausea vomiting. The patient is tolerating diet at this point. 6. End-stage renal disease. Nephrology to adjust dry weight lower. Procrit with dialysis. Dialysis as outpatient Monday Wednesday Friday. 7. Hyponatremia. Dialysis to help with a [place] lites. 8. Hallucination with history of depression. Could've worsened with urinary tract infection, hypoglycemia and hypoxia. This is better now on increased dose of Zyprexa. 9. Fluid overload and cough. Acute diastolic congestive heart failure. After dialysis from yesterday fluid removed. Dry weight has to be adjusted to a lower level.  DISCHARGE CONDITIONS:   Fair  CONSULTS OBTAINED:  Treatment Team:  Gonzella Lex, MD Leotis Pain, MD Dustin Flock, MD Cammie Sickle, MD  DRUG ALLERGIES:   Allergies  Allergen Reactions  . Compazine [Prochlorperazine Edisylate] Anaphylaxis and Nausea And Vomiting    23 Jul - patient relates that she takes promethazine frequently with no problems.  . Ace Inhibitors Swelling  . Ativan [Lorazepam] Other (  See Comments)    Reaction:  Hallucinations and headaches  . Codeine Nausea And Vomiting  . Dilaudid [Hydromorphone Hcl] Other (See Comments)    Delirium  . Gabapentin Other (See Comments)    Reaction:  Unknown   . Losartan Other (See Comments)    Reaction:  Unknown   . Ondansetron Other (See Comments)    Reaction:  Unknown   . Prochlorperazine Other (See Comments)    Reaction:  Unknown   . Reglan [Metoclopramide]     Per  patient her Dr. Evelina Bucy her off it   . Scopolamine Other (See Comments)    Reaction:  Unknown   . Zofran [Ondansetron Hcl] Other (See Comments)    Reaction:  hallucinations   . Oxycodone Anxiety  . Tape Rash    DISCHARGE MEDICATIONS:   Current Discharge Medication List    START taking these medications   Details  meclizine (ANTIVERT) 12.5 MG tablet Take 1 tablet (12.5 mg total) by mouth 3 (three) times daily as needed for dizziness. Qty: 30 tablet, Refills: 0      CONTINUE these medications which have CHANGED   Details  HYDROcodone-acetaminophen (NORCO/VICODIN) 5-325 MG tablet Take 1 tablet by mouth every 6 (six) hours as needed for moderate pain or severe pain. Qty: 15 tablet, Refills: 0    OLANZapine (ZYPREXA) 5 MG tablet Take 0.5 tablets (2.5 mg total) by mouth at bedtime. Qty: 30 tablet, Refills: 0      CONTINUE these medications which have NOT CHANGED   Details  acidophilus (RISAQUAD) CAPS capsule Take 1 capsule by mouth daily.    albuterol (PROVENTIL HFA;VENTOLIN HFA) 108 (90 Base) MCG/ACT inhaler Inhale 2 puffs into the lungs every 4 (four) hours as needed for wheezing or shortness of breath.    albuterol (PROVENTIL) (2.5 MG/3ML) 0.083% nebulizer solution Take 2.5 mg by nebulization every 4 (four) hours as needed for wheezing or shortness of breath.    amLODipine (NORVASC) 10 MG tablet Take 10 mg by mouth daily.    atorvastatin (LIPITOR) 20 MG tablet Take 20 mg by mouth every evening.    cetirizine (ZYRTEC) 10 MG tablet Take 10 mg by mouth daily.     FLUoxetine (PROZAC) 20 MG capsule Take 1 capsule (20 mg total) by mouth daily. Qty: 30 capsule, Refills: 0    glucose 4 GM chewable tablet Chew 3-4 tablets by mouth as needed for low blood sugar.    hydrALAZINE (APRESOLINE) 25 MG tablet Take 25 mg by mouth 3 (three) times daily.    lipase/protease/amylase (CREON) 12000 UNITS CPEP capsule Take 12,000-36,000 Units by mouth See admin instructions. 36,000 units daily with  breakfast; 24,000 units daily with lunch, and 36,000 units daily with dinner    multivitamin (RENA-VIT) TABS tablet Take 1 tablet by mouth daily.    omega-3 acid ethyl esters (LOVAZA) 1 g capsule Take 1 g by mouth daily.    pantoprazole (PROTONIX) 40 MG tablet Take 1 tablet (40 mg total) by mouth 2 (two) times daily before a meal. Qty: 60 tablet, Refills: 2    promethazine (PHENERGAN) 12.5 MG tablet Take 12.5 mg by mouth every 8 (eight) hours as needed for nausea.    ranitidine (ZANTAC) 150 MG tablet Take 150 mg by mouth daily.     rifaximin (XIFAXAN) 550 MG TABS tablet Take 1 tablet (550 mg total) by mouth 2 (two) times daily. Qty: 60 tablet, Refills: 0    sevelamer carbonate (RENVELA) 800 MG tablet Take 800 mg by mouth  3 (three) times daily with meals.     vitamin B-12 (CYANOCOBALAMIN) 1000 MCG tablet Take 1,000 mcg by mouth daily.    pregabalin (LYRICA) 25 MG capsule Take 25 mg by mouth daily.      STOP taking these medications     aspirin EC 81 MG tablet      clopidogrel (PLAVIX) 75 MG tablet      ranolazine (RANEXA) 1000 MG SR tablet          DISCHARGE INSTRUCTIONS:   Follow-up at [facility] one day Dialysis Monday Wednesday Friday Follow-up with Dr. Vira Agar gastroenterology next Tuesday at 4 PM  If you experience worsening of your admission symptoms, develop shortness of breath, life threatening emergency, suicidal or homicidal thoughts you must seek medical attention immediately by calling 911 or calling your MD immediately  if symptoms less severe.  You Must read complete instructions/literature along with all the possible adverse reactions/side effects for all the Medicines you take and that have been prescribed to you. Take any new Medicines after you have completely understood and accept all the possible adverse reactions/side effects.   Please note  You were cared for by a hospitalist during your hospital stay. If you have any questions about your  discharge medications or the care you received while you were in the hospital after you are discharged, you can call the unit and asked to speak with the hospitalist on call if the hospitalist that took care of you is not available. Once you are discharged, your primary care physician will handle any further medical issues. Please note that NO REFILLS for any discharge medications will be authorized once you are discharged, as it is imperative that you return to your primary care physician (or establish a relationship with a primary care physician if you do not have one) for your aftercare needs so that they can reassess your need for medications and monitor your lab values.    Today   CHIEF COMPLAINT:   Chief Complaint  Patient presents with  . Abdominal Pain  . Emesis    HISTORY OF PRESENT ILLNESS:  [name]  is a 55 y.o. female presented with abdominal pain and emesis.   VITAL SIGNS:  Blood pressure (!) 146/54, pulse 86, temperature 98.6 F (37 C), resp. rate 16, height 5' 5.5" (1.664 m), weight 75.3 kg (166 lb 0.1 oz), SpO2 100 %.  I/O:   Intake/Output Summary (Last 24 hours) at 04/29/16 1302 Last data filed at 04/29/16 0900  Gross per 24 hour  Intake              358 ml  Output             2500 ml  Net            -2142 ml    PHYSICAL EXAMINATION:  GENERAL:  55 y.o.-year-old patient lying in the bed with no acute distress.  EYES: Pupils equal, round, reactive to light and accommodation. No scleral icterus. Extraocular muscles intact.  HEENT: Head atraumatic, normocephalic. Oropharynx and nasopharynx clear.  NECK:  Supple, no jugular venous distention. No thyroid enlargement, no tenderness.  LUNGS: Normal breath sounds bilaterally, no wheezing, rales,rhonchi or crepitation. No use of accessory muscles of respiration.  CARDIOVASCULAR: S1, S2 normal. 3/6 systolic murmurs. No rubs, or gallops.  ABDOMEN: Soft, non-tender, non-distended. Bowel sounds present. No organomegaly or  mass.  EXTREMITIES: Trace edema, no cyanosis, or clubbing.  NEUROLOGIC: Cranial nerves II through XII are intact. Muscle  strength 5/5 in all extremities. Sensation intact. Gait not checked.  PSYCHIATRIC: The patient is alert and oriented x 3.  SKIN: No obvious rash, lesion, or ulcer.   DATA REVIEW:   CBC  Recent Labs Lab 04/28/16 1528  WBC 16.5*  HGB 7.9*  HCT 24.5*  PLT 239    Chemistries   Recent Labs Lab 04/28/16 1528  NA 126*  K 5.1  CL 89*  CO2 28  GLUCOSE 119*  BUN 27*  CREATININE 5.32*  CALCIUM 8.3*    Microbiology Results  Results for orders placed or performed during the hospital encounter of 04/21/16  Urine culture     Status: Abnormal   Collection Time: 04/21/16  9:19 AM  Result Value Ref Range Status   Specimen Description URINE, CLEAN CATCH  Final   Special Requests Normal  Final   Culture >=100,000 COLONIES/mL PROTEUS MIRABILIS (A)  Final   Report Status 04/23/2016 FINAL  Final   Organism ID, Bacteria PROTEUS MIRABILIS (A)  Final      Susceptibility   Proteus mirabilis - MIC*    AMPICILLIN <=2 SENSITIVE Sensitive     CEFAZOLIN 8 SENSITIVE Sensitive     CEFTRIAXONE <=1 SENSITIVE Sensitive     CIPROFLOXACIN <=0.25 SENSITIVE Sensitive     GENTAMICIN <=1 SENSITIVE Sensitive     IMIPENEM 4 SENSITIVE Sensitive     NITROFURANTOIN 256 RESISTANT Resistant     TRIMETH/SULFA <=20 SENSITIVE Sensitive     AMPICILLIN/SULBACTAM <=2 SENSITIVE Sensitive     PIP/TAZO <=4 SENSITIVE Sensitive     * >=100,000 COLONIES/mL PROTEUS MIRABILIS  MRSA PCR Screening     Status: None   Collection Time: 04/21/16  6:07 PM  Result Value Ref Range Status   MRSA by PCR NEGATIVE NEGATIVE Final    Comment:        The GeneXpert MRSA Assay (FDA approved for NASAL specimens only), is one component of a comprehensive MRSA colonization surveillance program. It is not intended to diagnose MRSA infection nor to guide or monitor treatment for MRSA infections.     RADIOLOGY:   Dg Chest 1 View  Result Date: 04/28/2016 CLINICAL DATA:  Pt admitted 7/31 with intermittent nausea and vomiting, shortness of breath; h/o chf, copd. EXAM: CHEST 1 VIEW COMPARISON:  04/26/2016 FINDINGS: Cardiac silhouette is enlarged. There is central vascular congestion as well as bilateral interstitial thickening and hazy perihilar and more confluent lung base opacity. There are bilateral pleural effusions, mildly increased from the prior study. No pneumothorax. Left sided vascular stent is stable. IMPRESSION: 1. Mild worsening of probable congestive heart failure. Central vascular congestion and interstitial thickening appears mildly increased from the previous exam. Pleural effusions have also mildly increased. Electronically Signed   By: Lajean Manes M.D.   On: 04/28/2016 11:56   Management plans discussed with the patient, and she is in agreement. I spoke with son yesterday about potentially getting out of the hospital today but unable to reach him today.  CODE STATUS:     Code Status Orders        Start     Ordered   04/21/16 1559  Full code  Continuous     04/21/16 1559    Code Status History    Date Active Date Inactive Code Status Order ID Comments User Context   04/21/2016  3:59 PM 04/21/2016  5:17 PM Full Code [id number]  Nicholes Mango, MD Inpatient   04/08/2016  2:15 PM 04/11/2016  7:47 PM Full Code [id number]  Theodoro Grist, MD Inpatient   03/15/2016  1:22 PM 03/20/2016 12:19 AM Full Code [id number]  Bettey Costa, MD Inpatient   02/16/2016  2:09 PM 02/19/2016 10:38 PM Full Code [id number]  Bettey Costa, MD ED   12/17/2015 11:39 AM 12/21/2015 10:09 PM Full Code [id number]  Dustin Flock, MD ED   11/12/2015  4:02 PM 12/01/2015  7:43 PM Full Code [id number]  Bettey Costa, MD Inpatient   10/07/2015  9:37 AM 10/12/2015  7:59 PM Full Code [id number]  Fritzi Mandes, MD ED   10/03/2015  3:55 AM 10/05/2015 10:30 PM Full Code [id number]  Lance Coon, MD Inpatient   08/31/2015  4:00 PM 09/03/2015 10:09 PM Full Code  [id number]  Demetrios Loll, MD ED   07/26/2015  3:28 PM 07/26/2015  9:36 PM Full Code [id number]  Dionisio David, MD Inpatient   07/22/2015  5:36 PM 07/26/2015  3:28 PM Full Code [id number]  Vaughan Basta, MD Inpatient   06/05/2015  8:39 PM 06/07/2015  8:03 PM Full Code [id number]  Bettey Costa, MD Inpatient   05/21/2015  9:42 PM 05/24/2015  4:26 PM Full Code [id number]  Henreitta Leber, MD Inpatient   04/24/2015  8:55 AM 04/26/2015 10:11 PM Full Code [id number]  Demetrios Loll, MD Inpatient   04/01/2015  3:58 PM 04/05/2015  8:07 PM Full Code [id number]  Epifanio Lesches, MD ED   03/06/2015  8:05 AM 03/09/2015  9:11 PM Full Code [id number]  Juluis Mire, MD Inpatient   02/19/2015  4:14 PM 02/24/2015  5:41 PM Full Code [id number]  Aldean Jewett, MD Inpatient    Advance Directive Documentation   Flowsheet Row Most Recent Value  Type of Advance Directive  Healthcare Power of Attorney, Living will  Pre-existing out of facility DNR order (yellow form or pink MOST form)  No data  "MOST" Form in Place?  No data      TOTAL TIME TAKING CARE OF THIS PATIENT: 35 minutes.    Loletha Grayer M.D on 04/29/2016 at 1:02 PM  Between 7am to 6pm - Pager - [phone]  After 6pm go to [email or web address] - password [email or web address]  [employer] Office  [phone]  CC: Primary care physician; Ellamae Sia, MD

## 2016-04-29 NOTE — Progress Notes (Signed)
Patient is medically stable for D/C to [facility] today. Per [place] admissions coordinator at [facility] patient will go to room 1A. RN will call report and arrange EMS for transport. Clinical Education officer, museum (CSW) sent D/C orders to [place] via [facility]. CSW sent DC orders to [facility]. Patient is aware of above. CSW contacted patient's son [name] and made him aware of above. Please reconsult if future social work needs arise. CSW signing off.   [employer], LCSW [phone]

## 2016-04-29 NOTE — Progress Notes (Signed)
Physical Therapy Treatment Patient Details Name: [name] MRN: [id number] DOB: 1961-05-09 Today's Date: 04/29/2016    History of Present Illness presented to ER from dialysis secondary to nausea/vomiting and reports of R-sided weakness; admitted to rule out TIA/CVA.  Imaging negative for acute event; symptoms likely metabolic in nature per notes. Hospital course complicated by labile blood sugars, periods of agitation (requiring haldol at times), and worsening confusion.    PT Comments    Pt in bed, alert ready for session.  Supine exercises as described below.  Pt to edge of bed with rail and HOB raised on left with ease.  No LOB in sitting.  She was able to stand and ambulate 120' with walker and min a x  1.  Pt overall improved mobility and activity tolerance today for session.  Pt was pleased with her abilities today but remains with general weakness and fatigue that limits mobility.  Fatigued with session.   Follow Up Recommendations  SNF     Equipment Recommendations       Recommendations for Other Services       Precautions / Restrictions Precautions Precautions: Fall Precaution Comments: L UE AV graft Restrictions Weight Bearing Restrictions: No    Mobility  Bed Mobility Overal bed mobility: Modified Independent Bed Mobility: Supine to Sit     Supine to sit: Supervision;HOB elevated (+ rail)     General bed mobility comments: with ease on left side on bed  Transfers Overall transfer level: Needs assistance Equipment used: Rolling walker (2 wheeled) Transfers: Sit to/from Stand Sit to Stand: Min assist         General transfer comment: increased time  Ambulation/Gait Ambulation/Gait assistance: Min assist Ambulation Distance (Feet): 120 Feet Assistive device: Rolling walker (2 wheeled) Gait Pattern/deviations: Step-through pattern   Gait velocity interpretation: Below normal speed for age/gender General Gait Details: improved step length but remains  decreased.  decreased step height,  no lob or buckling noted   Stairs            Wheelchair Mobility    Modified Rankin (Stroke Patients Only)       Balance Overall balance assessment: Needs assistance Sitting-balance support: Feet supported Sitting balance-Leahy Scale: Good     Standing balance support: Bilateral upper extremity supported Standing balance-Leahy Scale: Fair                      Cognition Arousal/Alertness: Awake/alert Behavior During Therapy: WFL for tasks assessed/performed Overall Cognitive Status: Within Functional Limits for tasks assessed                      Exercises Other Exercises Other Exercises: supine exercises for SLR, Heel slides, AB/Adduction, ankle pumps x 10 bilaterally prior to gait    General Comments        Pertinent Vitals/Pain Pain Assessment: No/denies pain    Home Living                      Prior Function            PT Goals (current goals can now be found in the care plan section) Acute Rehab PT Goals Patient Stated Goal: to go home    Frequency  Min 2X/week    PT Plan Current plan remains appropriate    Co-evaluation             End of Session Equipment Utilized During Treatment: Gait belt;Oxygen Activity Tolerance:  Patient tolerated treatment well Patient left: in chair;with call bell/phone within reach;with chair alarm set     Time: [id number] PT Time Calculation (min) (ACUTE ONLY): 18 min  Charges:  $Gait Training: 8-22 mins $Therapeutic Exercise: 8-22 mins                    G Codes:      Chesley Noon, PTA 04/29/16, 10:48 AM'

## 2016-04-29 NOTE — Progress Notes (Signed)
Occupational Therapy Treatment Patient Details Name: [name] MRN: [id number] DOB: 1961/03/01 Today's Date: 04/29/2016    History of present illness presented to ER from dialysis secondary to nausea/vomiting and reports of R-sided weakness; admitted to rule out TIA/CVA.  Imaging negative for acute event; symptoms likely metabolic in nature per notes. Hospital course complicated by labile blood sugars, periods of agitation (requiring haldol at times), and worsening confusion.   OT comments  Pt seen to discuss functional mobility with FWW since she was fatigued after walking with PT with FWW.  She reported that at home she was using FWW with one hand and carrying items with the other.  Discussed options like a walker basket or rollator to use which would be safer and decrease risk of falls. Also rec using a reacher to decrease forward flexion and conserve energy.  Pt able to demonstrate back pursed lip breathing to also help with conserving energy for ADLs at home.  Currently on 2L of O2. Pt much more energetic and able to participate in therapy today.   Follow Up Recommendations  SNF    Equipment Recommendations   Management consultant)    Recommendations for Other Services      Precautions / Restrictions Precautions Precautions: Fall Precaution Comments: L UE AV graft Restrictions Weight Bearing Restrictions: No       Mobility Bed Mobility Overal bed mobility: Modified Independent Bed Mobility: Supine to Sit     Supine to sit: Supervision;HOB elevated (+ rail)     General bed mobility comments: with ease on left side on bed  Transfers Overall transfer level: Needs assistance Equipment used: Rolling walker (2 wheeled) Transfers: Sit to/from Stand Sit to Stand: Min assist         General transfer comment: increased time    Balance Overall balance assessment: Needs assistance Sitting-balance support: Feet supported Sitting balance-Leahy Scale: Good     Standing balance  support: Bilateral upper extremity supported Standing balance-Leahy Scale: Fair                     ADL Overall ADL's : Needs assistance/impaired                                       General ADL Comments: Pt seen to discuss functional mobility with FWW since she was fatigued after walking with PT with FWW.  She reported that at home she was using FWW with one hand and carrying items with the other.  Discussed options like a walker basket or rollator to use which would be safer and decrease risk of falls. Also rec using a reacher to decrease forward flexion and conserve energy.  Pt able to demonstrate back pursed lip breathing to also help with conserving energy for ADLs at home.  Currently on 2L of O2.      Vision                     Perception     Praxis      Cognition   Behavior During Therapy: [facility] for tasks assessed/performed Overall Cognitive Status: Within Functional Limits for tasks assessed                       Extremity/Trunk Assessment               Exercises Other Exercises Other Exercises: supine exercises  for SLR, Heel slides, AB/Adduction, ankle pumps x 10 bilaterally prior to gait   Shoulder Instructions       General Comments      Pertinent Vitals/ Pain       Pain Assessment: No/denies pain  Home Living                                          Prior Functioning/Environment              Frequency Min 1X/week     Progress Toward Goals  OT Goals(current goals can now be found in the care plan section)  Progress towards OT goals: Progressing toward goals  Acute Rehab OT Goals Patient Stated Goal: to go home OT Goal Formulation: With patient Time For Goal Achievement: 05/10/16 Potential to Achieve Goals: Good  Plan Discharge plan remains appropriate    Co-evaluation                 End of Session     Activity Tolerance Patient tolerated treatment well   Patient  Left in chair;with call bell/phone within reach;with chair alarm set   Nurse Communication          Time: [id number] OT Time Calculation (min): 31 min  Charges: OT General Charges $OT Visit: 1 Procedure OT Treatments $Self Care/Home Management : 23-37 mins   Chrys Racer, OTR/L ascom [phone] 04/29/16, 11:06 AM

## 2016-04-29 NOTE — Progress Notes (Signed)
Speech Language Pathology Treatment: Dysphagia  Patient Details Name: [name] MRN: [id number] DOB: Oct 27, 1960 Today's Date: 04/29/2016 Time: 7445-1460 SLP Time Calculation (min) (ACUTE ONLY): 25 min  Assessment / Plan / Recommendation Clinical Impression  Pt appears to be tolerating a po diet following general aspiration precautions; no overt s/s of aspiration and no decline in respiratory status have been noted by pt/staff. Pt is aware of general aspiration precautions; few verbal cues given to prompt as a reminder. Pt stated she is ready to go home today. No further skilled ST services indicated at this time. NSG to reconsult if any change in status. Recommend continue w/ current diet as ordered and Pills taken w/ a puree for easier swallowing if indicated. Pt agreed.    HPI HPI: Pt is a 55 y.o. female with multiple medical issues including a known history of HOH(R ear), End-stage renal disease on hemodialysis, chronic encephalopathy, chronic diastolic congestive heart failure, anxiety, COPD, diabetes mellitus HTN, GERD, and anxiety/depression. She was seen by dialysis center today and was sent to the hospital as patient was nauseous and vomiting also reporting right-sided weakness. During my examination patient was nauseous and reporting body aches t. Urinalysis is abnormal. Patient is a poor historian in view of encephalopathy. This morning she stated she has been "swallowing ok" but could not elaborate. She does endorse GERD symptoms and continues w/ episodes of gagging/dry heaving; N/V. NSG is giving medications for nausea and reflux. Pt is pending dialysis.      SLP Plan  All goals met     Recommendations  Diet recommendations: Dysphagia 3 (mechanical soft);Thin liquid Liquids provided via: Cup;Straw Medication Administration: Whole meds with puree (f indicated for easier for swallowing) Supervision: Patient able to self feed Compensations: Minimize environmental distractions;Slow  rate;Small sips/bites;Follow solids with liquid Postural Changes and/or Swallow Maneuvers: Seated upright 90 degrees;Upright 30-60 min after meal             General recommendations:  (none) Oral Care Recommendations: Oral care BID;Patient independent with oral care Follow up Recommendations: None Plan: All goals met     GO               [name], MS, CCC-SLP  [name],[name] 04/29/2016, 2:17 PM

## 2016-04-29 NOTE — Discharge Instructions (Signed)
Oxygen 2 liters nasal canula continuous

## 2016-05-03 LAB — SULFONYLUREA HYPOGLYCEMICS PANEL, SERUM
Acetohexamide: NEGATIVE ug/mL (ref 20–60)
Chlorpropamide: NEGATIVE ug/mL (ref 75–250)
Glimepiride: NEGATIVE ng/mL (ref 80–250)
Glipizide: NEGATIVE ng/mL (ref 200–1000)
Glyburide: NEGATIVE ng/mL
Nateglinide: NEGATIVE ng/mL
Repaglinide: NEGATIVE ng/mL
Tolazamide: NEGATIVE ug/mL
Tolbutamide: NEGATIVE ug/mL (ref 40–100)

## 2016-05-04 LAB — PROINSULIN/INSULIN RATIO
Insulin: 0.66 u[IU]/mL
Proinsulin/Insulin Ratio: 452 %
Proinsulin: 20 pmol/L

## 2016-06-12 ENCOUNTER — Ambulatory Visit
Admission: RE | Admit: 2016-06-12 | Payer: Medicare Other | Source: Ambulatory Visit | Admitting: Unknown Physician Specialty

## 2016-06-12 ENCOUNTER — Encounter: Admission: RE | Payer: Self-pay | Source: Ambulatory Visit

## 2016-06-12 SURGERY — COLONOSCOPY WITH PROPOFOL
Anesthesia: General

## 2016-06-23 IMAGING — CR DG CHEST DECUBITUS BILAT
1 series · 2 of 2 positions shown · non-contrast
Comparison: Chest radiograph earlier same day.

CLINICAL DATA: Patient with history of pleural effusion.

EXAM:
CHEST - BILATERAL DECUBITUS VIEW

[Series 1: view not recorded · 0.14mm/px · 2 of 2 slices shown]
[im 1/2]
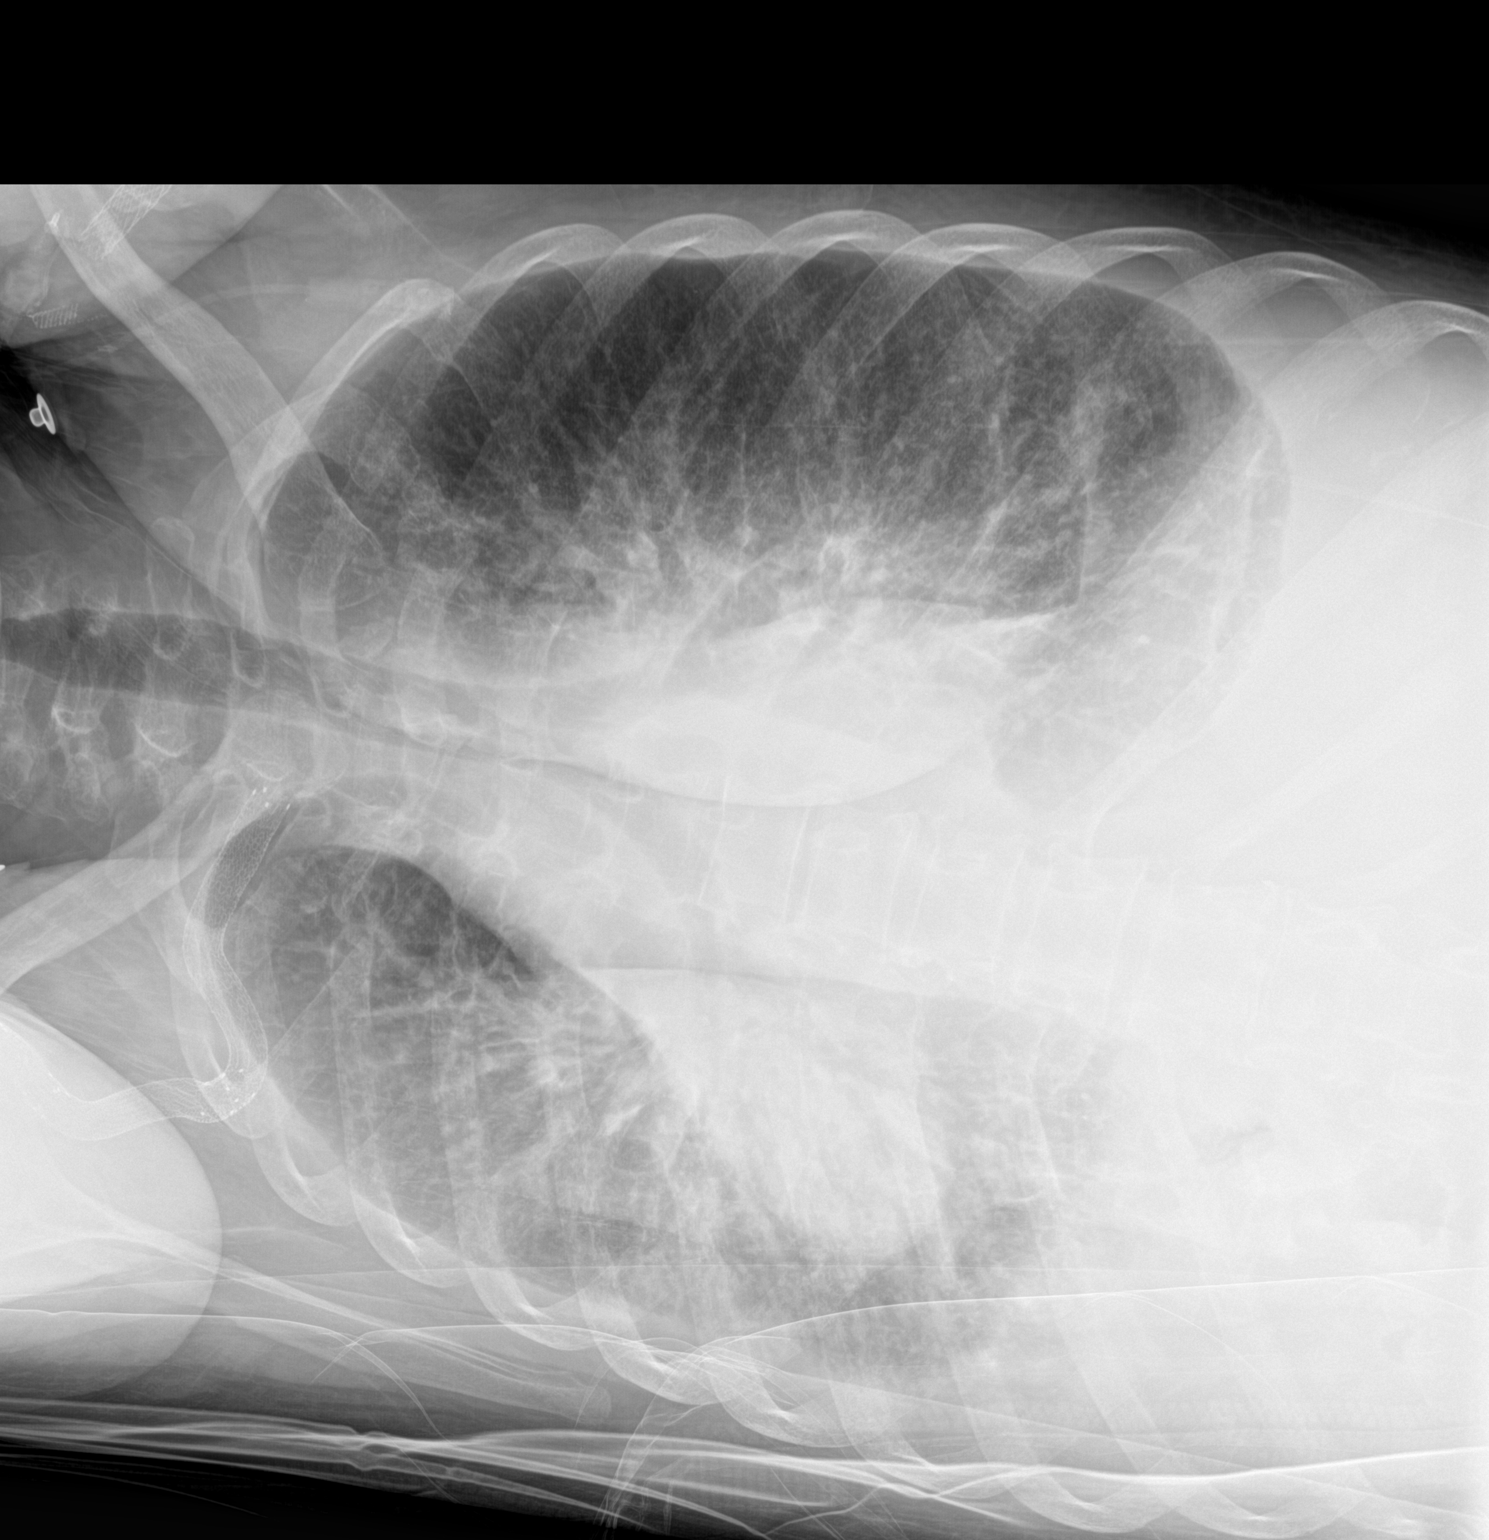
[im 2/2]
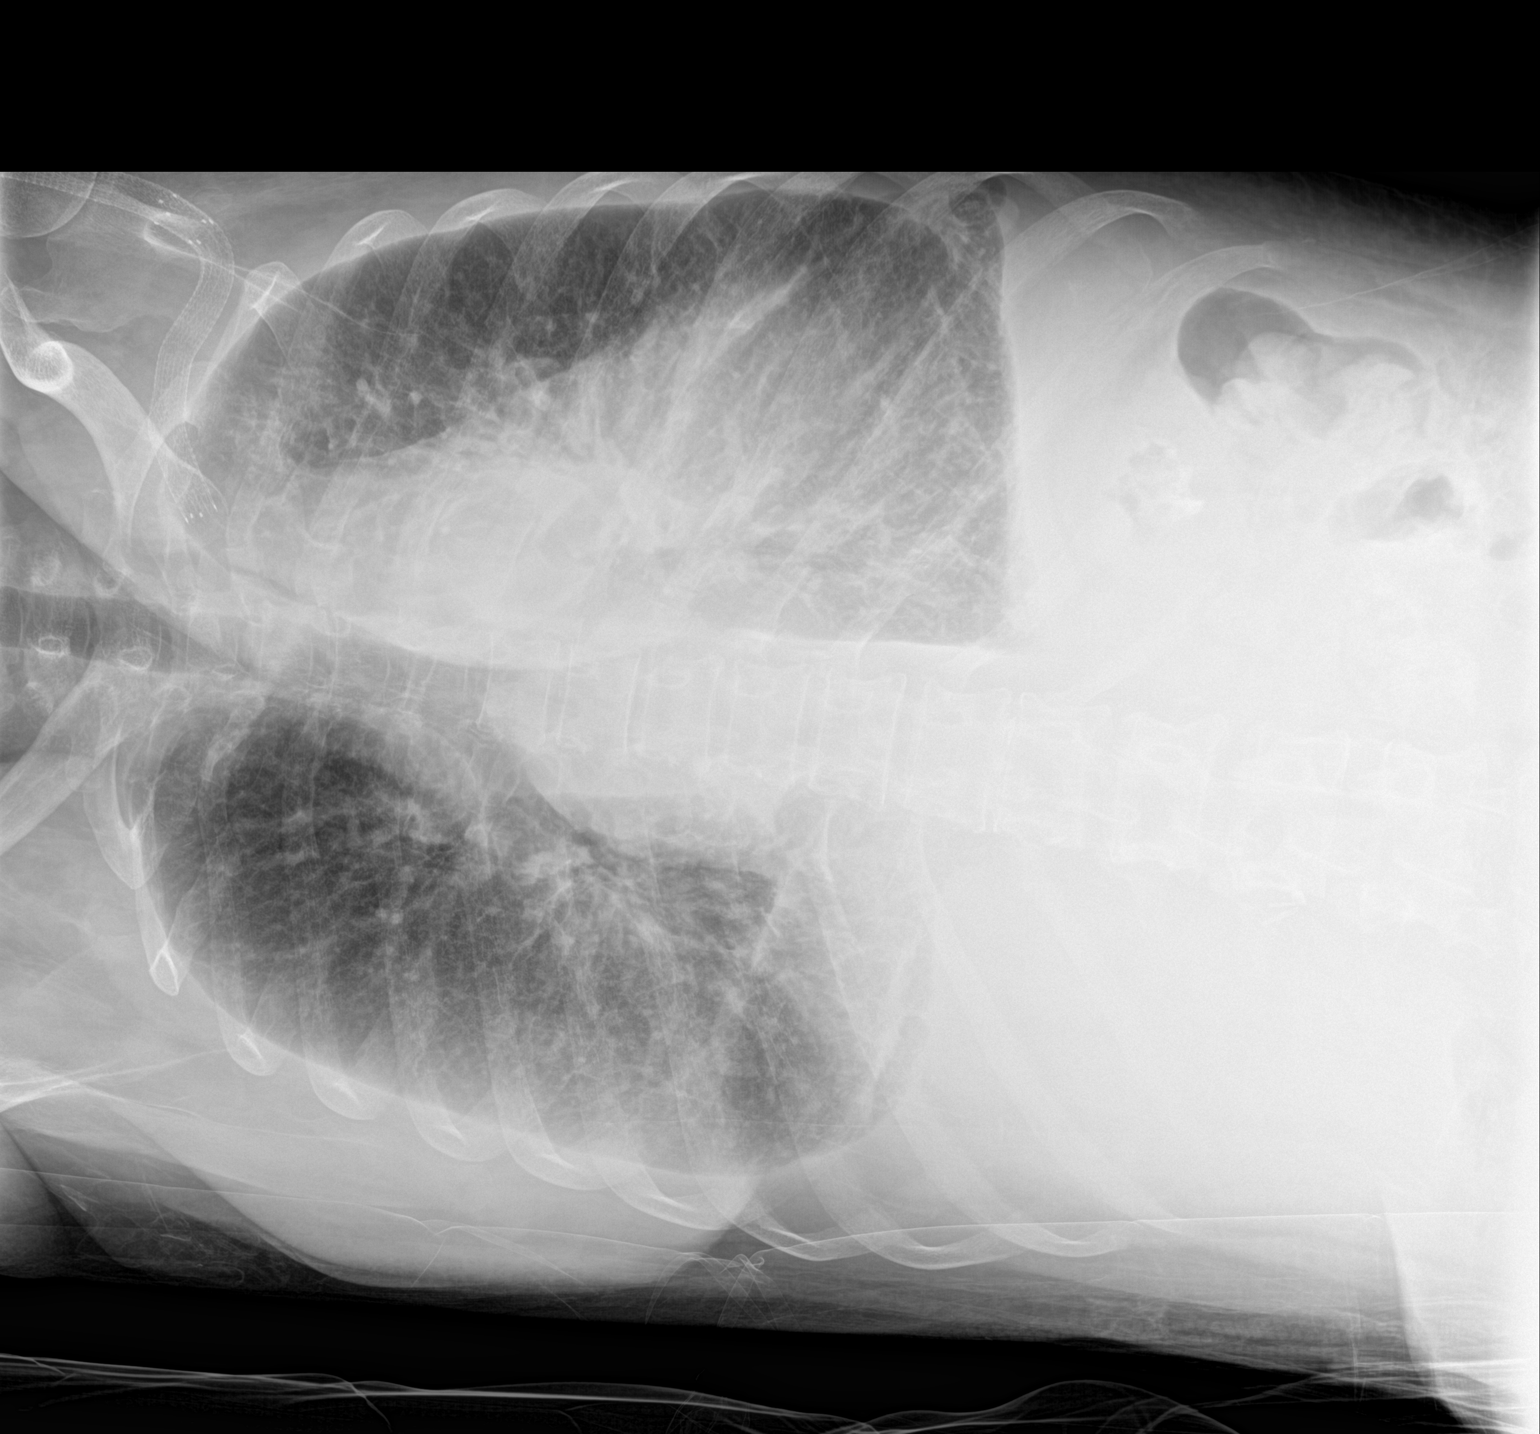

[2 of 2 positions shown; findings below may reference images not displayed]

FINDINGS: Bilateral decubitus images were obtained demonstrating small
layering bilateral pleural effusions. Cardiomegaly. Diffuse
bilateral perihilar interstitial pulmonary opacities compatible with
edema. Stent graft material overlying the left axilla. No
pneumothorax.
IMPRESSION: Small layering bilateral pleural effusions.

Cardiomegaly and interstitial pulmonary edema.

## 2016-06-23 IMAGING — CT CT ABD-PELV W/ CM
2 of 5 series · 15 of 46 positions shown, 17 images · IV contrast (iopamidol)
Comparison: 11/12/2015

CLINICAL DATA: Abdominal pain.  Increasing abdominal to the touch.

EXAM:
CT ABDOMEN AND PELVIS WITH CONTRAST
TECHNIQUE: Multidetector CT imaging of the abdomen and pelvis was performed
using the standard protocol following bolus administration of
intravenous contrast.
CONTRAST:  100mL PCG7EE-OKK IOPAMIDOL (PCG7EE-OKK) INJECTION 61%

[Series 2: routine abd pel with · axial · 0.68mm/px · z∈[-488,-108]mm · 12 of 85 slices shown, 14 images]
[im 5/85  soft-tissue]
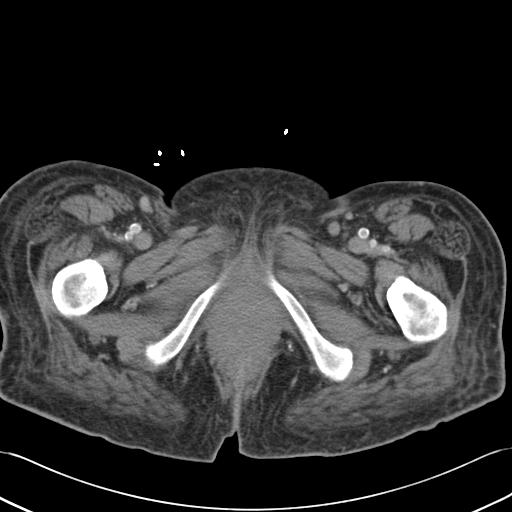
[im 5/85  bone]
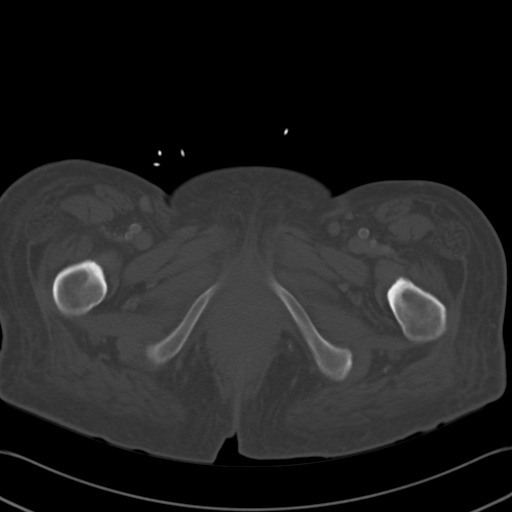
[im 13/85  soft-tissue]
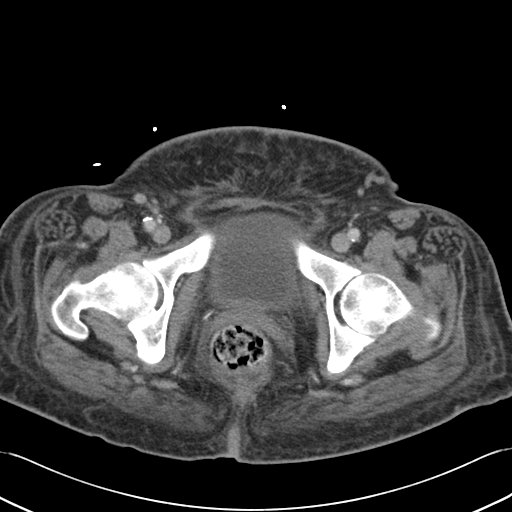
[im 21/85  soft-tissue]
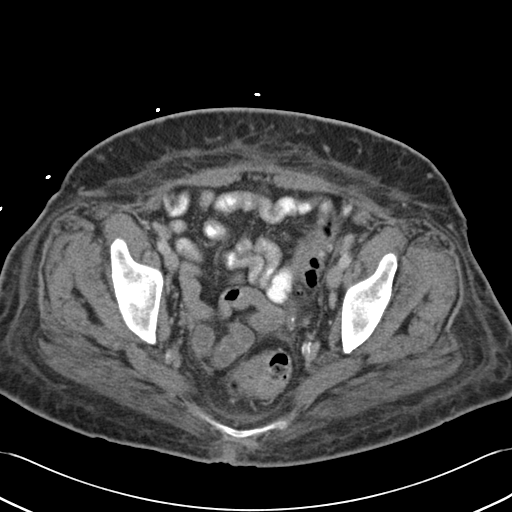
[im 25/85  soft-tissue]
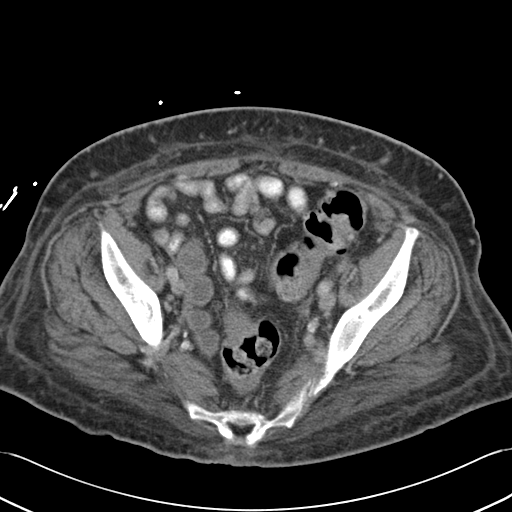
[im 33/85  soft-tissue]
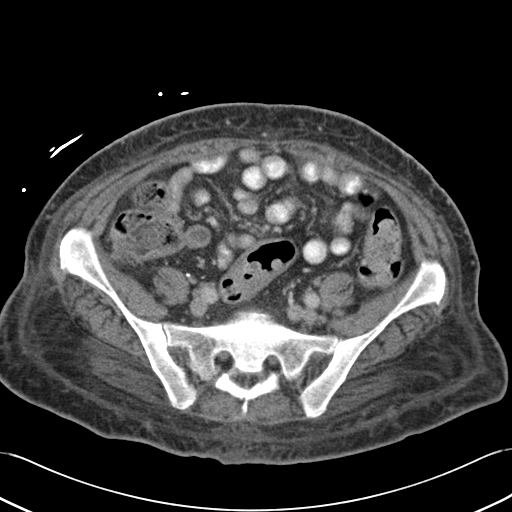
[im 41/85  soft-tissue]
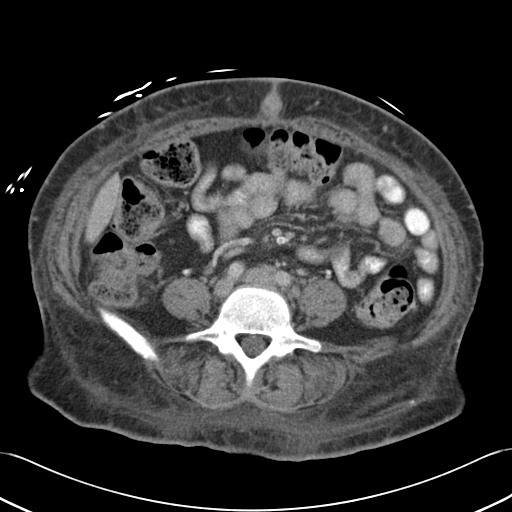
[im 45/85  soft-tissue]
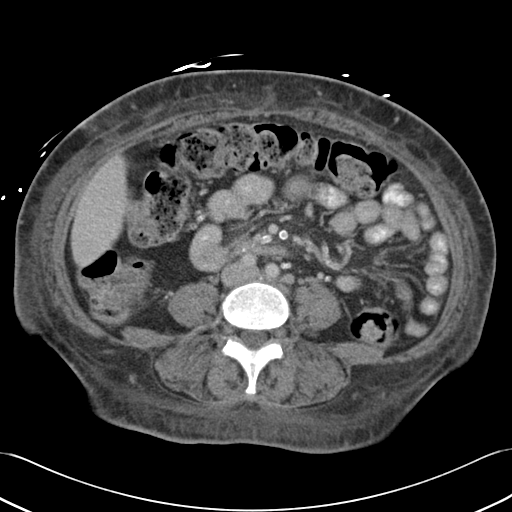
[im 53/85  soft-tissue]
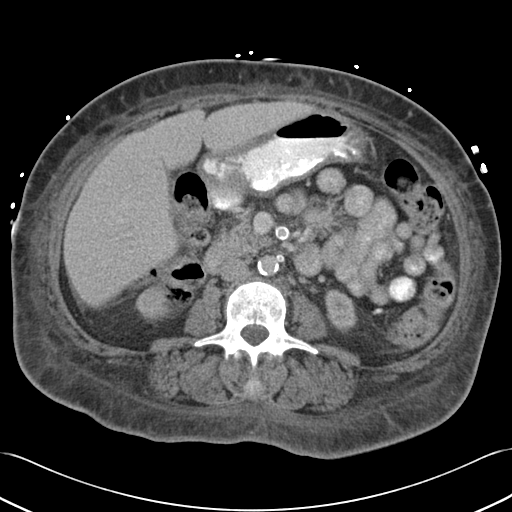
[im 61/85  soft-tissue]
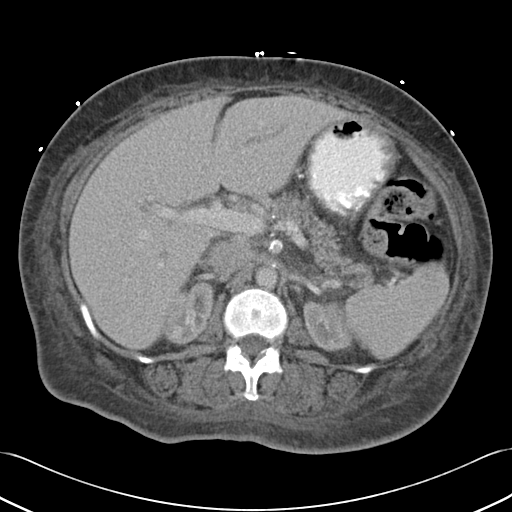
[im 61/85  bone]
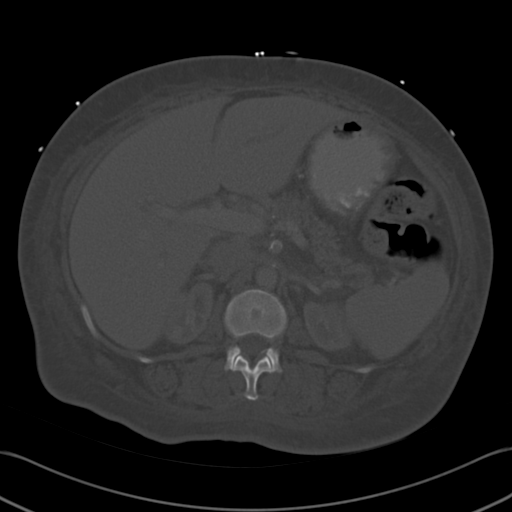
[im 65/85  soft-tissue]
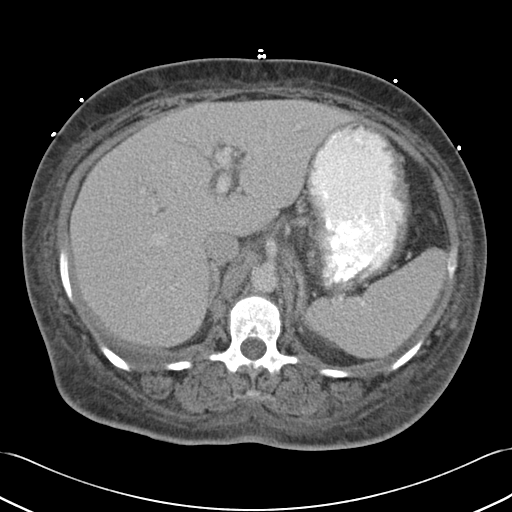
[im 73/85  soft-tissue]
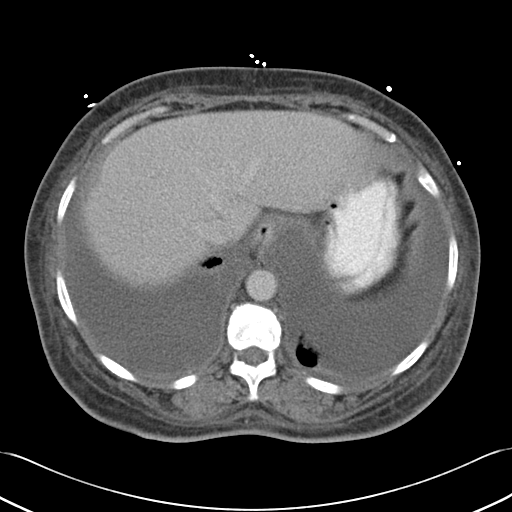
[im 81/85  soft-tissue]
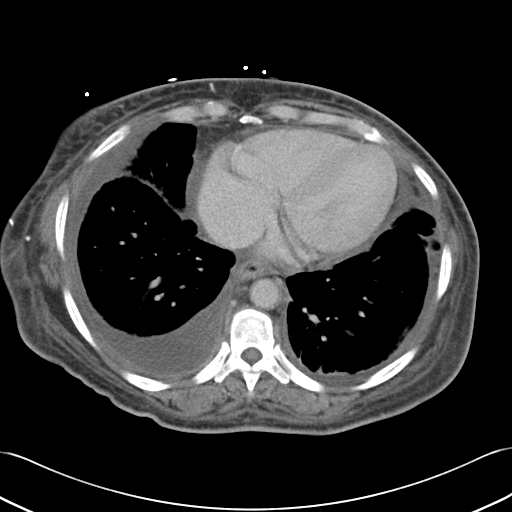

[Series 5: cor routine abd pel with · coronal · 0.59mm/px · 3 of 136 slices shown]
[im 46/136  soft-tissue]
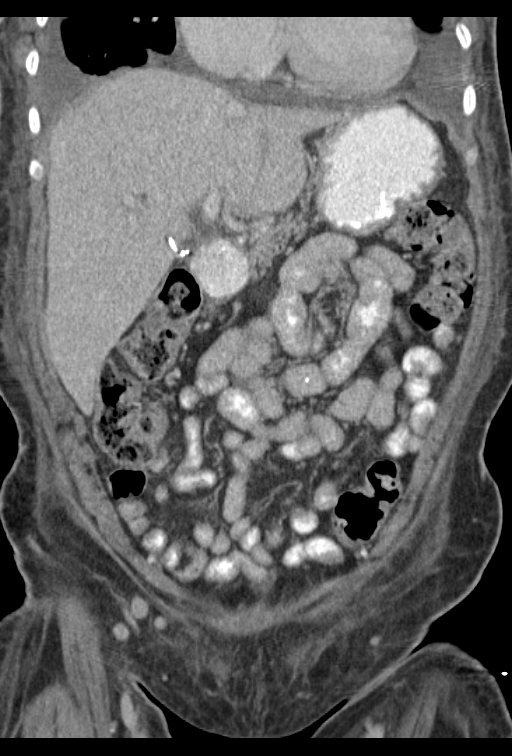
[im 61/136  soft-tissue]
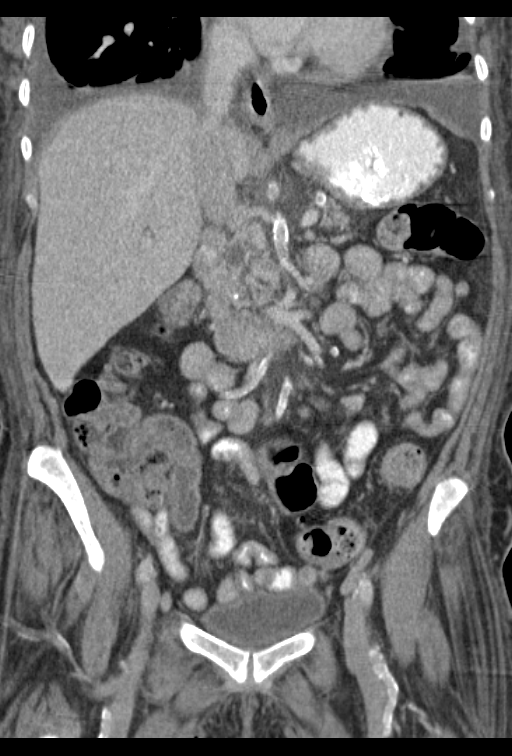
[im 76/136  soft-tissue]
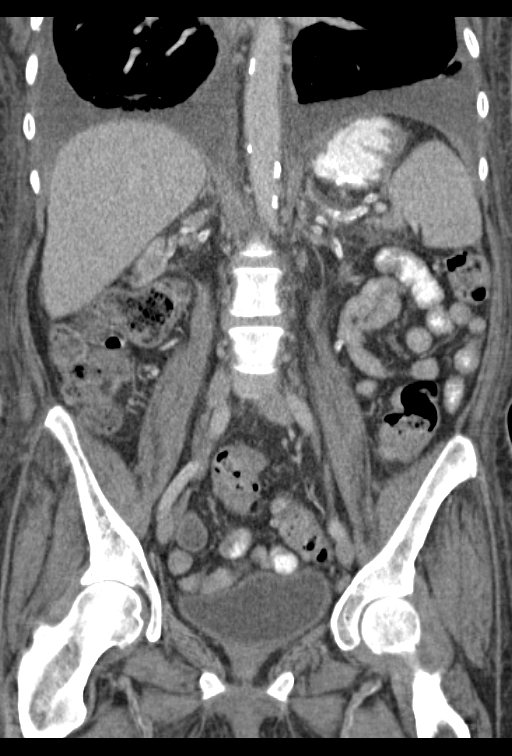

[15 of 46 positions shown; findings below may reference images not displayed]

FINDINGS: Lower chest: Loculated bilateral small pleural effusions, right
greater than left. Normal heart size.

Hepatobiliary: Normal liver.  Prior cholecystectomy.

Pancreas: Normal.

Spleen: Normal.

Adrenals/Urinary Tract: Normal adrenal glands. Bilateral atrophic
kidneys. No urolithiasis or obstructive uropathy. Normal bladder.

Stomach/Bowel: No bowel wall thickening or bowel dilatation. No
pneumatosis, pneumoperitoneum or portal venous gas. Moderate amount
of stool throughout the colon. No abdominal or pelvic free fluid.

Vascular/Lymphatic: Normal caliber abdominal aorta. Abdominal aortic
atherosclerosis. Diffuse atherosclerosis of the superior mesenteric
artery inferior mesenteric artery. No lymphadenopathy.

Reproductive: Prior hysterectomy.  No adnexal mass.

Other: No fluid collection or hematoma.  Diffuse anasarca.

Musculoskeletal: No acute osseous abnormality. No lytic or sclerotic
osseous lesion.
IMPRESSION: 1. Diffuse anasarca.
2. Loculated bilateral small pleural effusions, right greater than
left.
3. No bowel obstruction.
4. Previous demonstrated small enhancing right renal masses are not
appreciated on the current examination and may be secondary to the
phase of imaging. Consider nonemergent abdomen MRI without contrast
for further
evaluation.

## 2016-06-23 IMAGING — DX DG CHEST 1V PORT
1 series · 1 of 1 positions shown · non-contrast
Comparison: December 20, 2015.

CLINICAL DATA: Shortness of breath.

EXAM:
PORTABLE CHEST 1 VIEW

[chest ap]
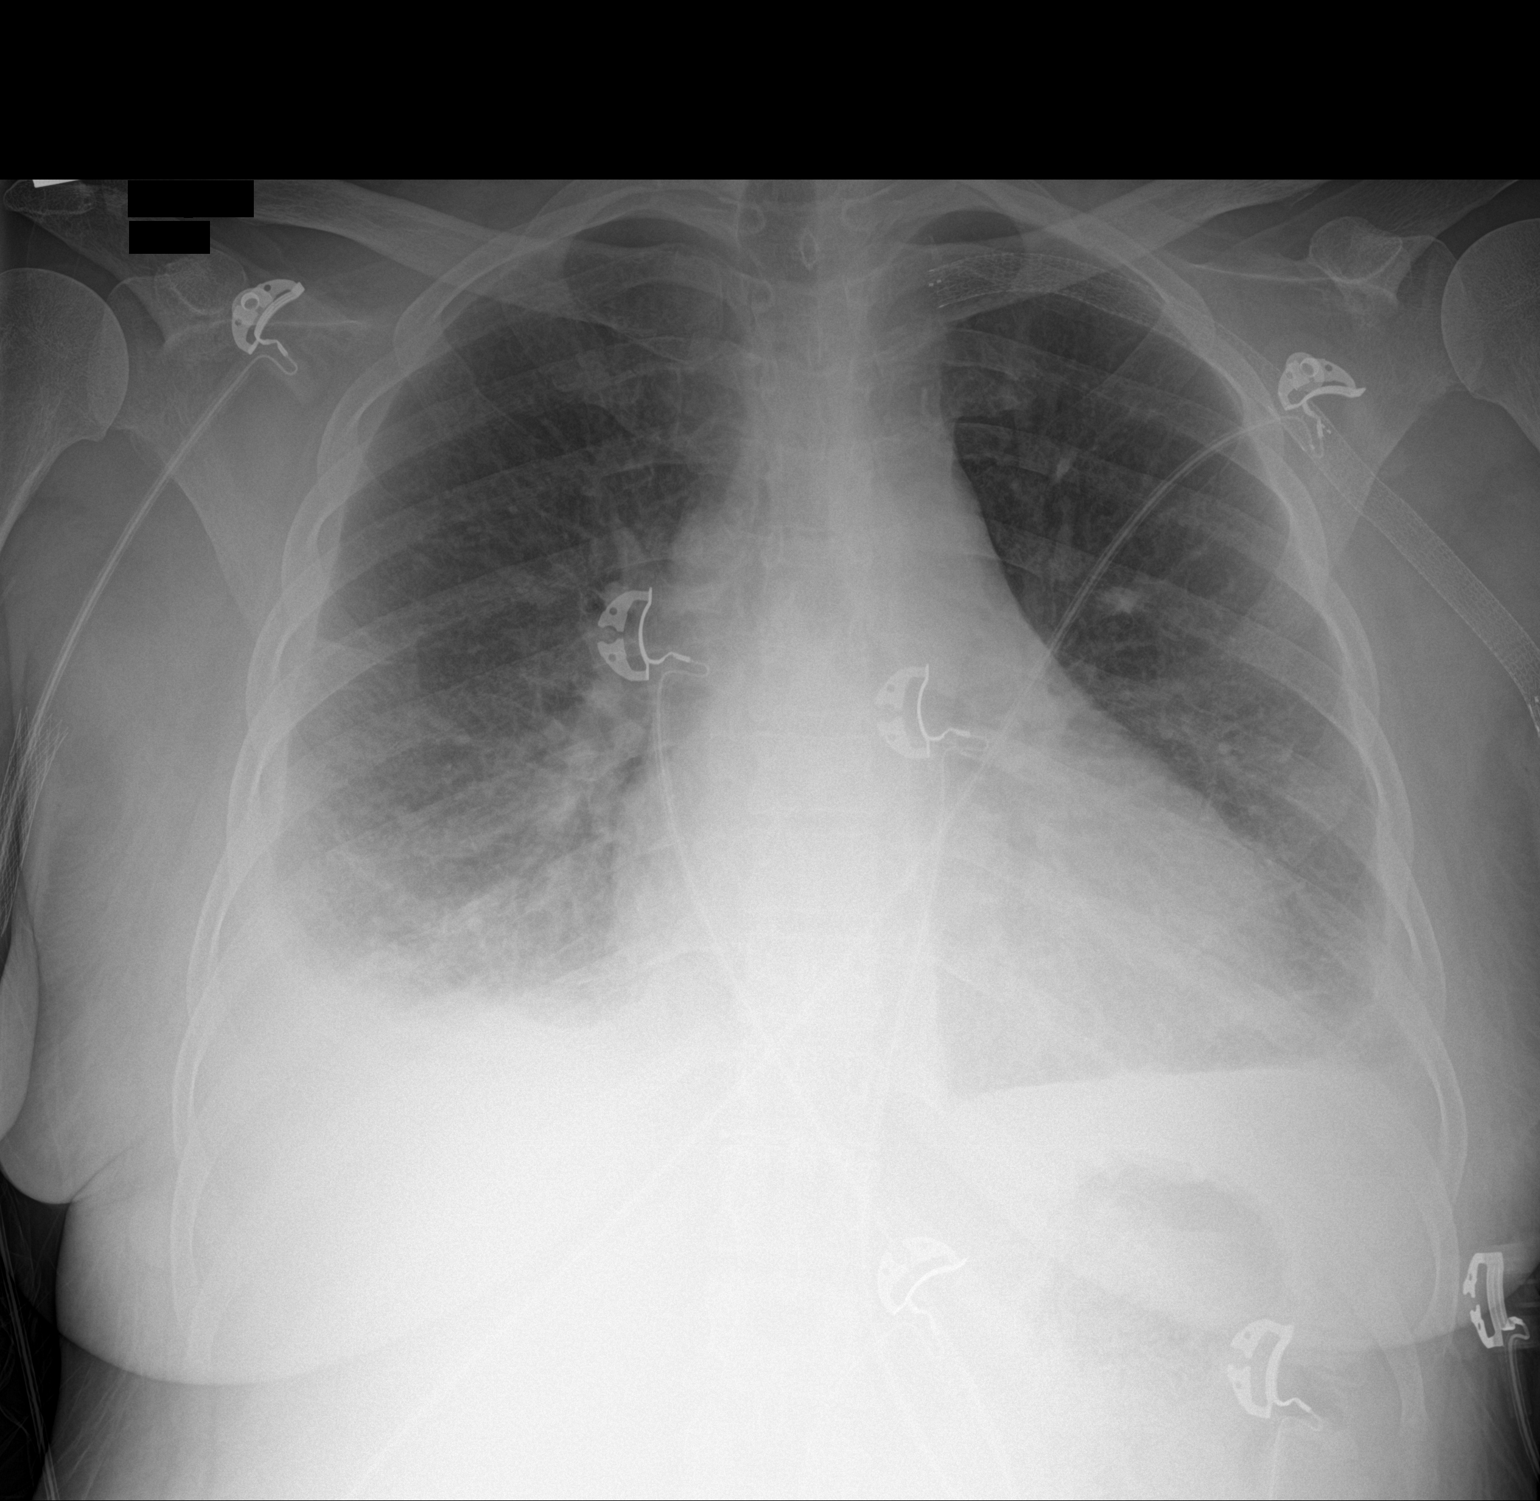

[1 of 1 positions shown; findings below may reference images not displayed]

FINDINGS: Stable cardiomediastinal silhouette. No pneumothorax is noted. Mild
bibasilar interstitial densities are noted with mild associated
pleural effusions. Bony thorax is unremarkable.
IMPRESSION: Mild bibasilar opacities are noted consistent with edema or
atelectasis with associated pleural effusions, right greater than
left.

## 2016-06-24 ENCOUNTER — Ambulatory Visit: Payer: Medicare Other | Admitting: Urology

## 2016-06-24 IMAGING — CR DG THORACIC SPINE 2V
1 series · 2 of 2 positions shown · non-contrast
Comparison: 02/16/2016 and 11/21/2015

CLINICAL DATA: Upper back pain.  Chest pain.

EXAM:
THORACIC SPINE 2 VIEWS

[Series 1: dg thoracic spine 2 view · 0.14mm/px · 2 of 2 slices shown]
[im 1/2]
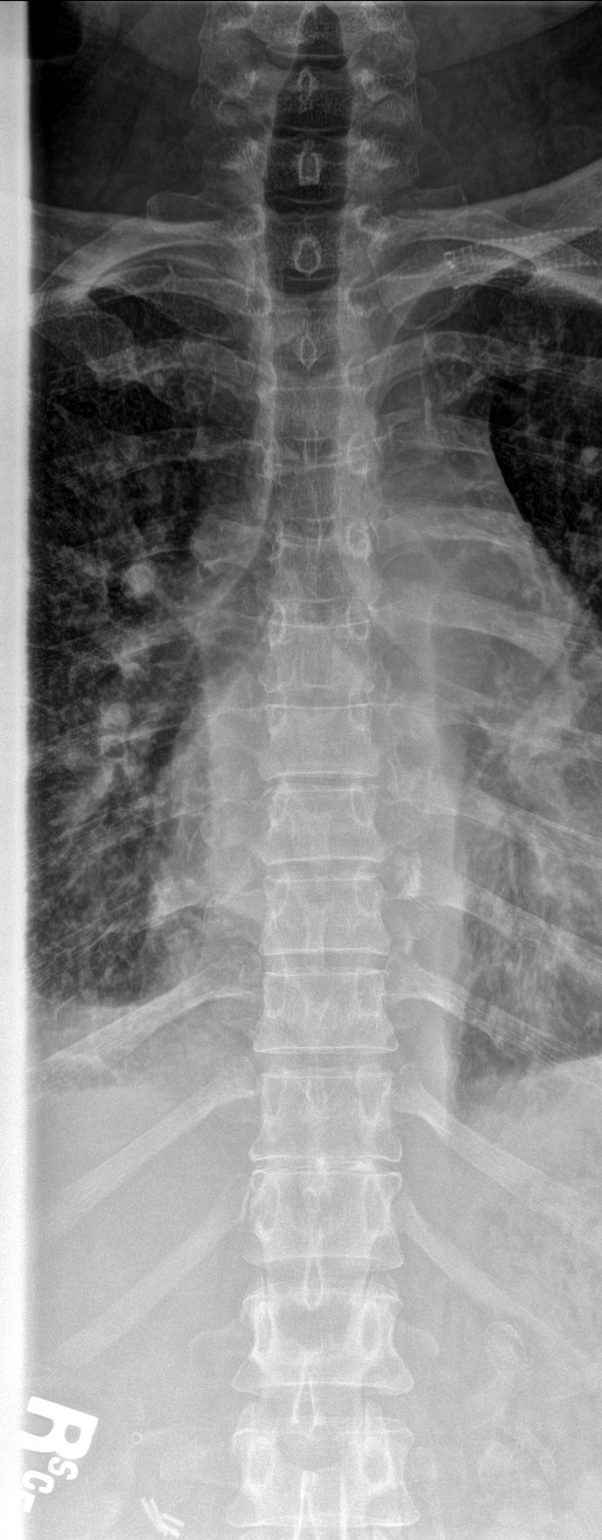
[im 2/2]
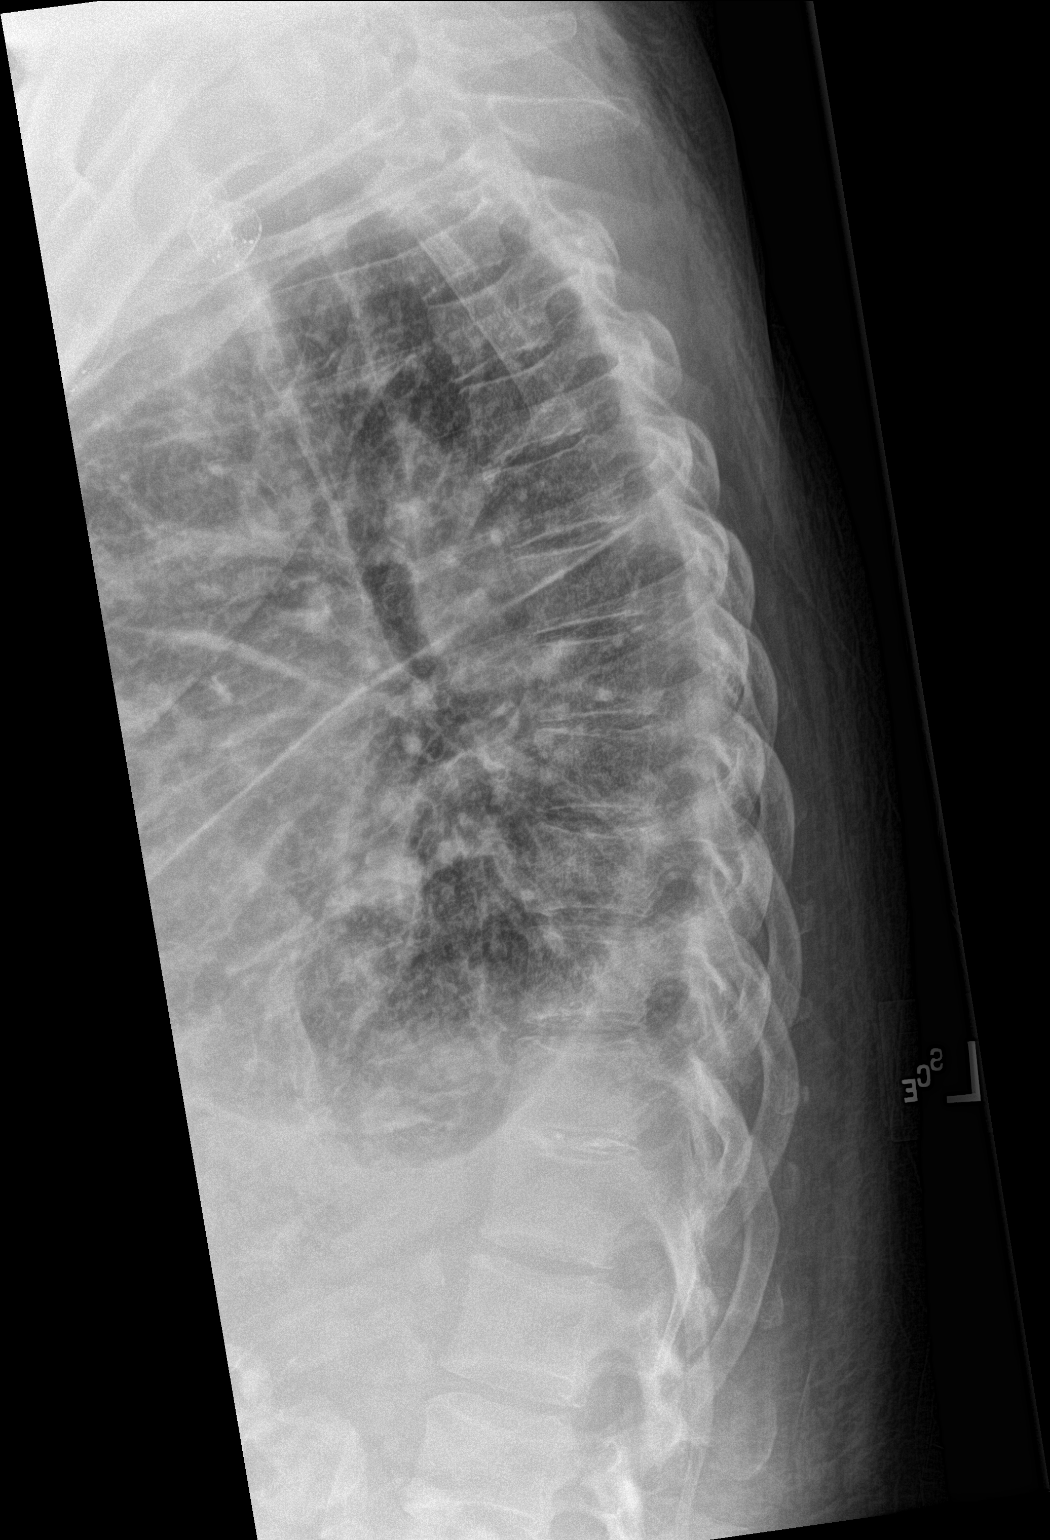

[2 of 2 positions shown; findings below may reference images not displayed]

FINDINGS: Stent in the left subclavian graft. Indistinct interstitial opacity
in the lungs with bilateral pleural effusions and thickening of the
fissures.

Interbody and interspinous fusion at T11-12. No thoracic spine
fracture identified. No subluxation observed. Atherosclerotic
calcifications in the upper abdomen.
IMPRESSION: 1. Suspected pulmonary edema and small bilateral pleural effusions.
2. Interbody and interspinous fusion at T11-12.

## 2016-06-24 IMAGING — US US THORACENTESIS ASP PLEURAL SPACE W/IMG GUIDE
1 series · 1 of 1 positions shown · non-contrast
Comparison: none

CLINICAL DATA: Chest pain, loculated pleural effusion

EXAM:
EXAM
THORACENTESIS WITH ULTRASOUND
TECHNIQUE: The procedure, risks (including but not limited to bleeding,
infection, organ damage ), benefits, and alternatives were explained
to the patient. Questions regarding the procedure were encouraged
and answered. The patient understands and consents to the procedure.
Survey ultrasound of the right hemithorax was performed and an
appropriate skin entry site was localized. Site was marked, prepped
with chlorhexidine, draped in usual sterile fashion, infiltrated
locally with 1% lidocaine.
The [place] needle was advanced into the pleural space.
Cloudy fluid returned. 300 mL was removed. Samples sent for
requested studies. The patient tolerated procedure well.
COMPLICATIONS:
COMPLICATIONS
None immediate

[Series 1: us thoracentesis asp pleural space w/img guide · 0.25mm/px · 1 of 1 slices shown]
[im 1/1]
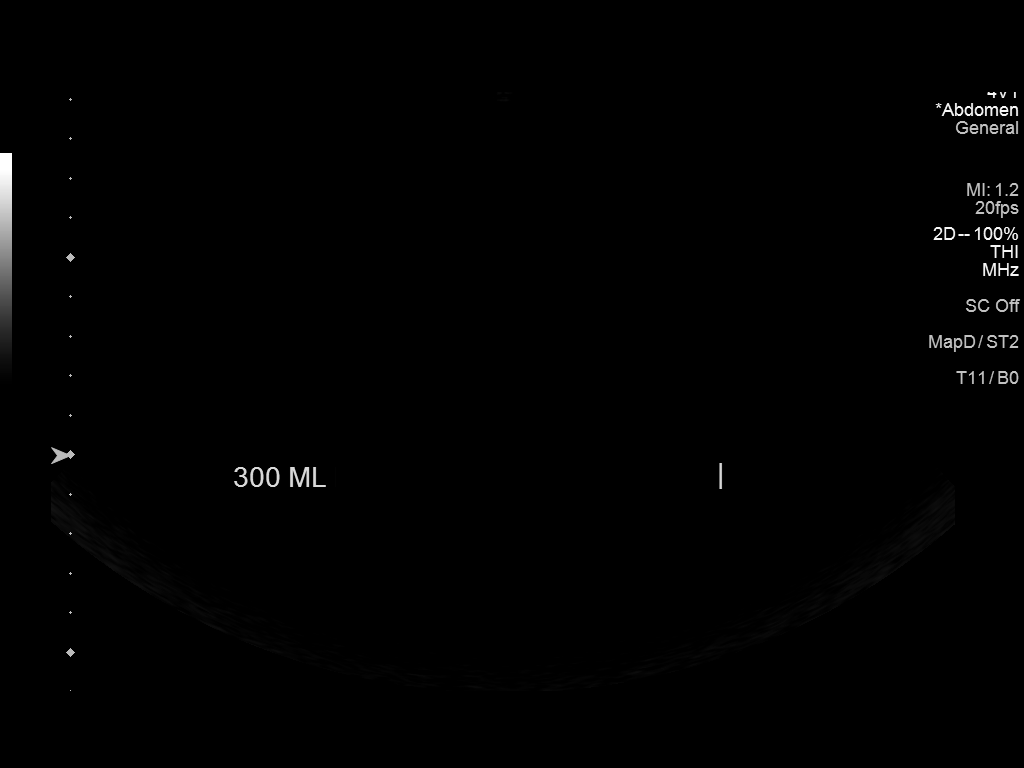

[1 of 1 positions shown; findings below may reference images not displayed]

IMPRESSION: Technically successful ultrasound-guided right thoracentesis.
Follow-up chest CT pending.

## 2016-06-24 IMAGING — CT CT CHEST W/O CM
2 of 3 series · 15 of 46 positions shown, 17 images · non-contrast
Comparison: Chest CT 11/21/2015.

CLINICAL DATA: 54-year-old female status post thoracentesis.
Bilateral pleural effusions.

EXAM:
CT CHEST WITHOUT CONTRAST
TECHNIQUE: Multidetector CT imaging of the chest was performed following the
standard protocol without IV contrast.

[Series 2: routine chest wo · axial · 0.60mm/px · z∈[-262,-42]mm · 12 of 52 slices shown, 14 images]
[im 4/52  soft-tissue]
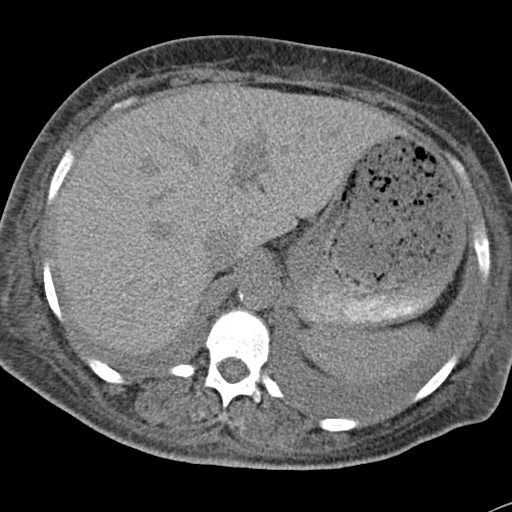
[im 4/52  bone]
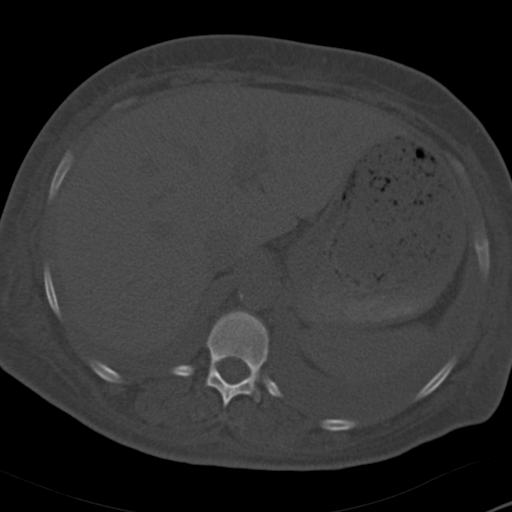
[im 7/52  soft-tissue]
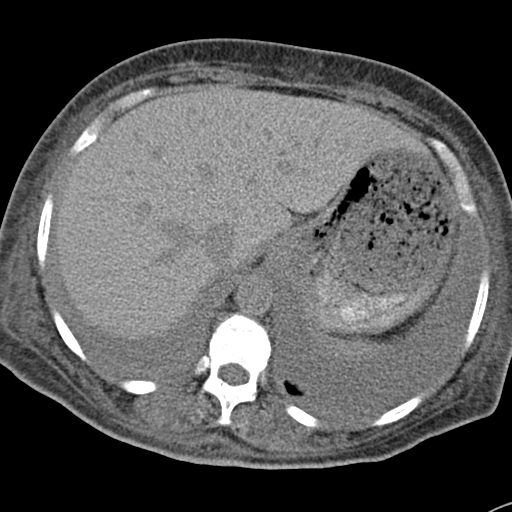
[im 12/52  soft-tissue]
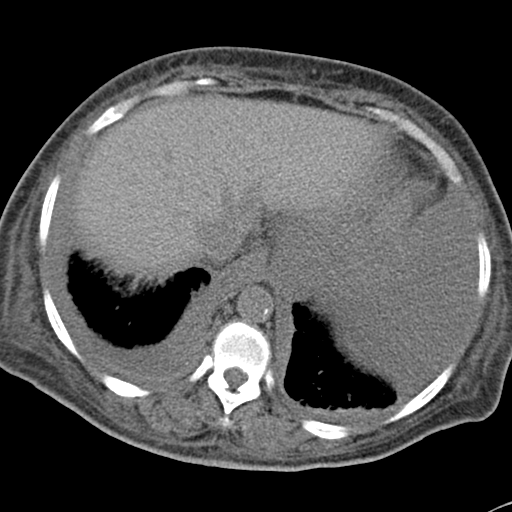
[im 15/52  soft-tissue]
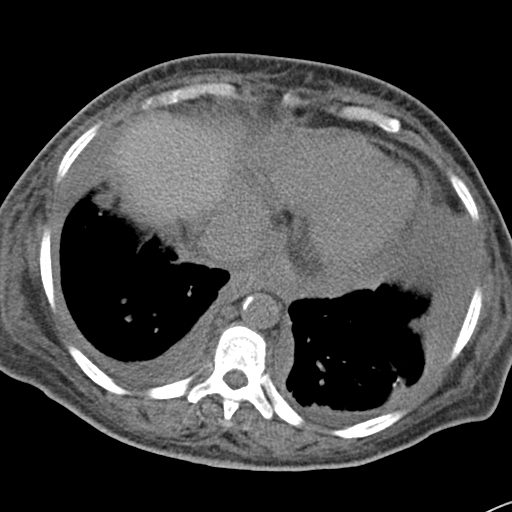
[im 20/52  soft-tissue]
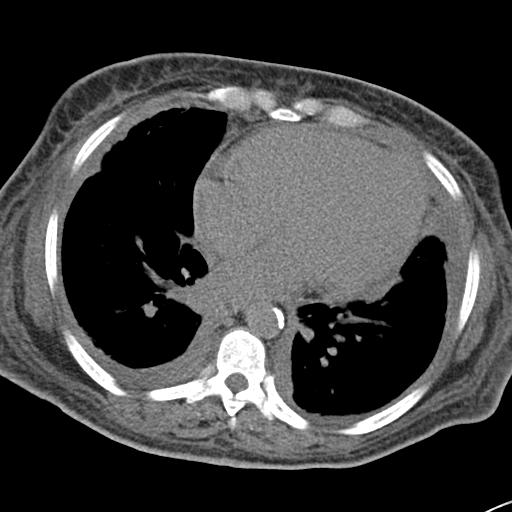
[im 24/52  soft-tissue]
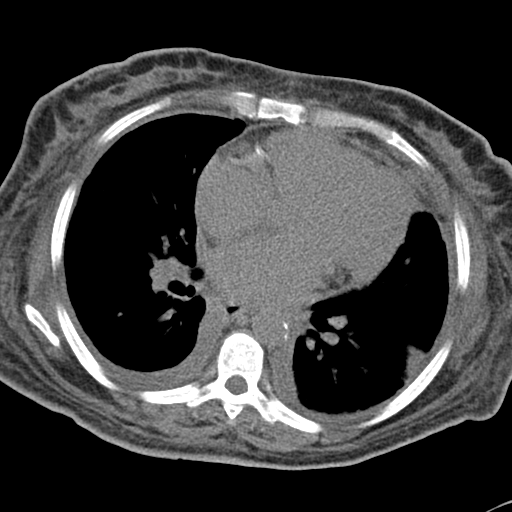
[im 28/52  soft-tissue]
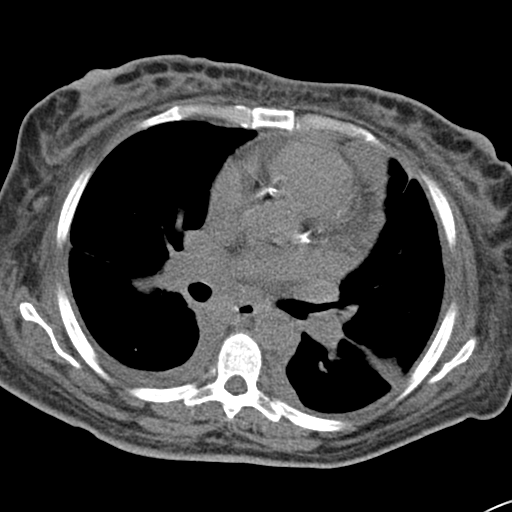
[im 32/52  soft-tissue]
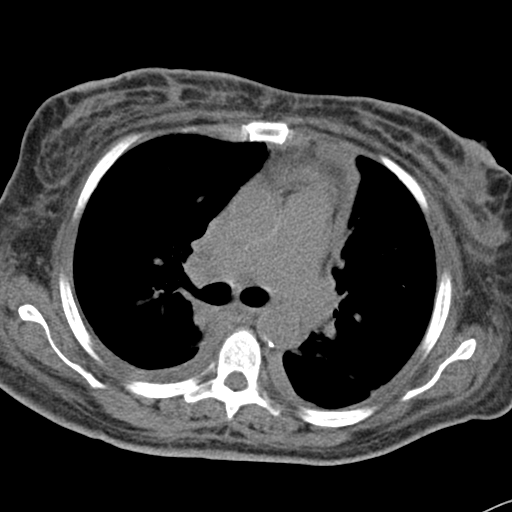
[im 37/52  soft-tissue]
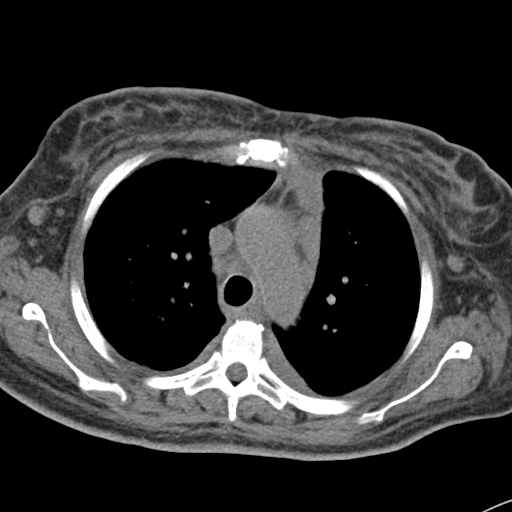
[im 37/52  bone]
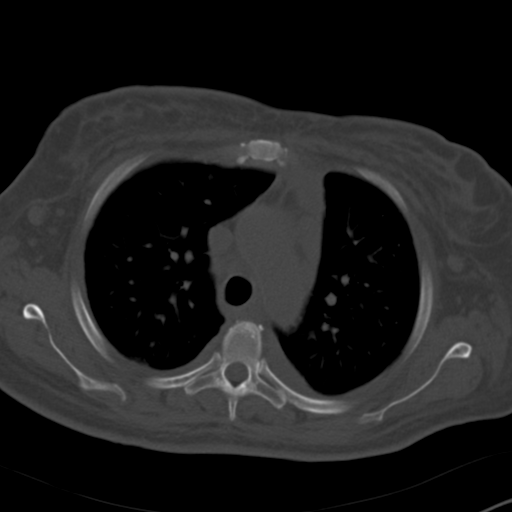
[im 40/52  soft-tissue]
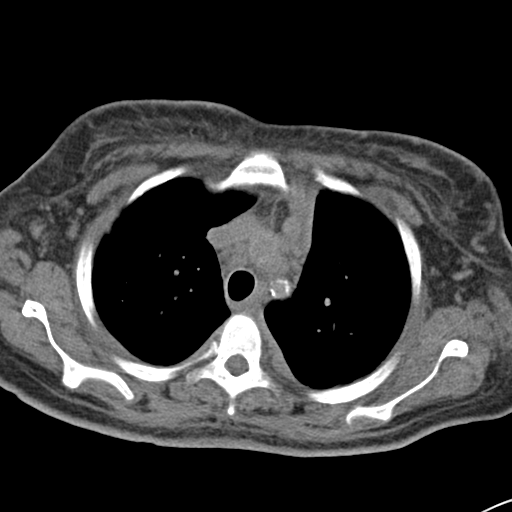
[im 45/52  soft-tissue]
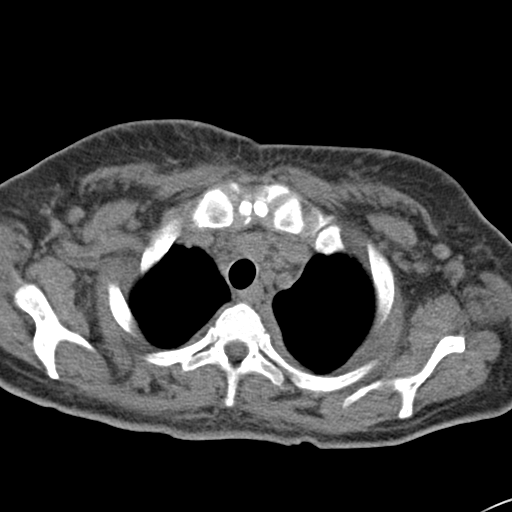
[im 48/52  soft-tissue]
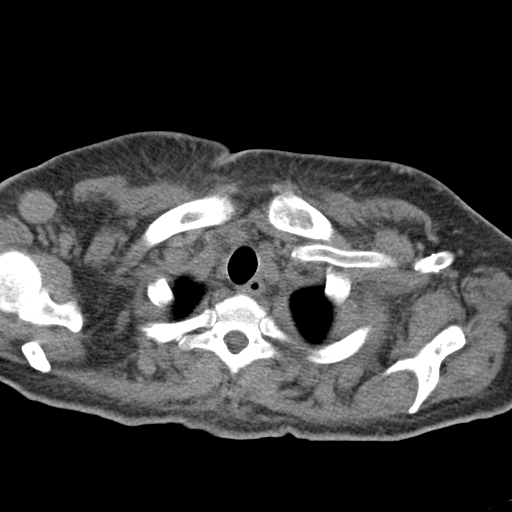

[Series 5: routine chest wo cor · coronal · 0.54mm/px · 3 of 117 slices shown]
[im 39/117  soft-tissue]
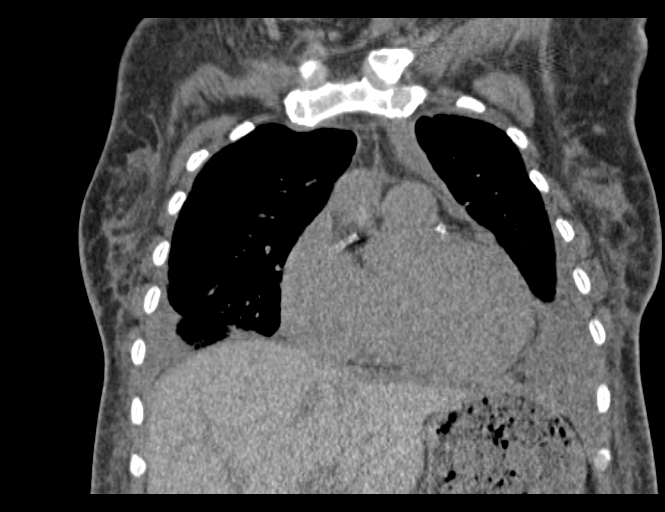
[im 52/117  soft-tissue]
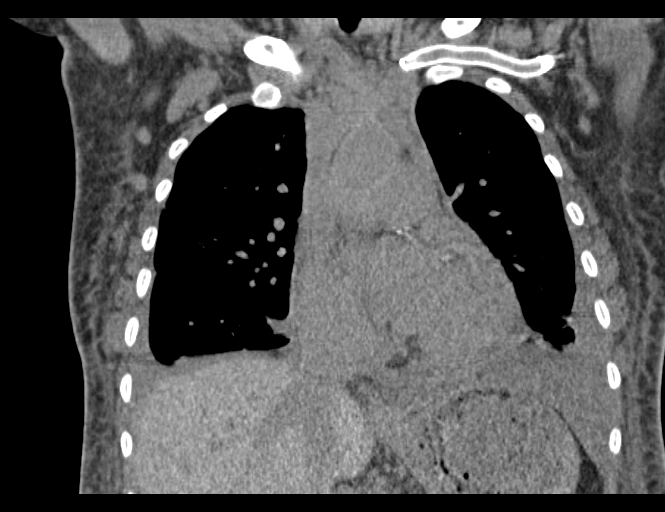
[im 65/117  soft-tissue]
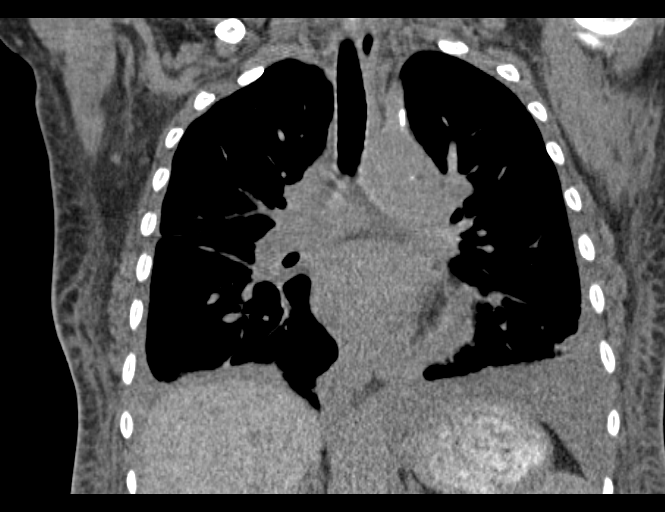

[15 of 46 positions shown; findings below may reference images not displayed]

FINDINGS: Mediastinum/Lymph Nodes: Heart size is normal. There is no
significant pericardial fluid, thickening or pericardial
calcification. There is atherosclerosis of the thoracic aorta, the
great vessels of the mediastinum and the coronary arteries,
including calcified atherosclerotic plaque in the left main, left
anterior descending, left circumflex and right coronary arteries.
Multiple prominent borderline enlarged mediastinal and hilar lymph
nodes. Mildly enlarged prevascular lymph node measuring up to 11 mm
in short axis. Esophagus is unremarkable in appearance. No axillary
lymphadenopathy. Vascular stent in the left axillary and subclavian
vein.

Lungs/Pleura: Mild diffuse interlobular septal thickening and a few
patchy areas of very mild ground-glass attenuation, suggesting a
background of mild pulmonary edema. No confluent consolidative
airspace disease. Small bilateral pleural effusions, which appear at
least partially loculated, particularly in the left hemithorax. No
pneumothorax. Areas of mild scarring and/or atelectasis throughout
the periphery of the lung bases bilaterally.

Upper Abdomen: Unremarkable.

Musculoskeletal/Soft Tissues: There are no aggressive appearing
lytic or blastic lesions noted in the visualized portions of the
skeleton.
IMPRESSION: 1. The appearance of the chest suggests mild congestive heart
failure, as above.
2. Associated with this there are small bilateral pleural effusions,
which appear at least partially loculated particularly on the left
side.
3. No pneumothorax following thoracentesis.
4. Multiple borderline enlarged and minimally enlarged mediastinal
and bilateral hilar lymph nodes, nonspecific, but likely reactive.
5. Atherosclerosis, including left main and 3 vessel coronary artery
disease. Please note that although the presence of coronary artery
calcium documents the presence of coronary artery disease, the
severity of this disease and any potential stenosis cannot be
assessed on this non-gated CT examination. Assessment for potential
risk factor modification, dietary therapy or pharmacologic therapy
may be warranted, if clinically indicated.
6. Additional incidental findings, as above.

## 2016-06-25 ENCOUNTER — Ambulatory Visit: Payer: Medicare Other | Admitting: Urology

## 2016-07-08 ENCOUNTER — Other Ambulatory Visit: Payer: Self-pay | Admitting: Urology

## 2016-07-08 ENCOUNTER — Ambulatory Visit: Payer: Medicare Other | Admitting: Urology

## 2016-07-11 ENCOUNTER — Encounter: Payer: Self-pay | Admitting: Emergency Medicine

## 2016-07-11 ENCOUNTER — Emergency Department: Payer: Medicare Other

## 2016-07-11 ENCOUNTER — Emergency Department
Admission: EM | Admit: 2016-07-11 | Discharge: 2016-07-11 | Disposition: A | Discharge status: Home / Self Care | Payer: Medicare Other | Attending: Student in an Organized Health Care Education/Training Program | Admitting: Student in an Organized Health Care Education/Training Program

## 2016-07-11 DIAGNOSIS — Z87891 Personal history of nicotine dependence: Secondary | ICD-10-CM | POA: Insufficient documentation

## 2016-07-11 DIAGNOSIS — Z79899 Other long term (current) drug therapy: Secondary | ICD-10-CM | POA: Insufficient documentation

## 2016-07-11 DIAGNOSIS — I2510 Atherosclerotic heart disease of native coronary artery without angina pectoris: Secondary | ICD-10-CM | POA: Diagnosis not present

## 2016-07-11 DIAGNOSIS — R112 Nausea with vomiting, unspecified: Secondary | ICD-10-CM | POA: Insufficient documentation

## 2016-07-11 DIAGNOSIS — E1122 Type 2 diabetes mellitus with diabetic chronic kidney disease: Secondary | ICD-10-CM | POA: Insufficient documentation

## 2016-07-11 DIAGNOSIS — R1084 Generalized abdominal pain: Secondary | ICD-10-CM

## 2016-07-11 DIAGNOSIS — N186 End stage renal disease: Secondary | ICD-10-CM | POA: Diagnosis not present

## 2016-07-11 DIAGNOSIS — J45909 Unspecified asthma, uncomplicated: Secondary | ICD-10-CM | POA: Insufficient documentation

## 2016-07-11 DIAGNOSIS — J449 Chronic obstructive pulmonary disease, unspecified: Secondary | ICD-10-CM | POA: Diagnosis not present

## 2016-07-11 DIAGNOSIS — I5031 Acute diastolic (congestive) heart failure: Secondary | ICD-10-CM | POA: Insufficient documentation

## 2016-07-11 DIAGNOSIS — R110 Nausea: Secondary | ICD-10-CM

## 2016-07-11 DIAGNOSIS — Z992 Dependence on renal dialysis: Secondary | ICD-10-CM | POA: Insufficient documentation

## 2016-07-11 DIAGNOSIS — I132 Hypertensive heart and chronic kidney disease with heart failure and with stage 5 chronic kidney disease, or end stage renal disease: Secondary | ICD-10-CM | POA: Insufficient documentation

## 2016-07-11 LAB — MAGNESIUM: Magnesium: 2.2 mg/dL (ref 1.7–2.4)

## 2016-07-11 LAB — BASIC METABOLIC PANEL
Anion gap: 12 (ref 5–15)
BUN: 13 mg/dL (ref 6–20)
CO2: 31 mmol/L (ref 22–32)
Calcium: 9 mg/dL (ref 8.9–10.3)
Chloride: 93 mmol/L — ABNORMAL LOW (ref 101–111)
Creatinine, Ser: 2.61 mg/dL — ABNORMAL HIGH (ref 0.44–1.00)
GFR calc Af Amer: 22 mL/min — ABNORMAL LOW (ref >60)
GFR calc non Af Amer: 19 mL/min — ABNORMAL LOW (ref >60)
Glucose, Bld: 152 mg/dL — ABNORMAL HIGH (ref 65–99)
Potassium: 3.5 mmol/L (ref 3.5–5.1)
Sodium: 136 mmol/L (ref 135–145)

## 2016-07-11 LAB — CBC WITH DIFFERENTIAL/PLATELET
Basophils Absolute: 0 10*3/uL (ref 0–0.1)
Basophils Relative: 0 %
Eosinophils Absolute: 0.2 10*3/uL (ref 0–0.7)
Eosinophils Relative: 2 %
HCT: 37.3 % (ref 35.0–47.0)
Hemoglobin: 12.4 g/dL (ref 12.0–16.0)
Lymphocytes Relative: 15 %
Lymphs Abs: 1.2 10*3/uL (ref 1.0–3.6)
MCH: 25.8 pg — ABNORMAL LOW (ref 26.0–34.0)
MCHC: 33.1 g/dL (ref 32.0–36.0)
MCV: 77.9 fL — ABNORMAL LOW (ref 80.0–100.0)
Monocytes Absolute: 0.7 10*3/uL (ref 0.2–0.9)
Monocytes Relative: 9 %
Neutro Abs: 6 10*3/uL (ref 1.4–6.5)
Neutrophils Relative %: 74 %
Platelets: 193 10*3/uL (ref 150–440)
RBC: 4.8 MIL/uL (ref 3.80–5.20)
RDW: 20.7 % — ABNORMAL HIGH (ref 11.5–14.5)
WBC: 8.1 10*3/uL (ref 3.6–11.0)

## 2016-07-11 LAB — HEPATIC FUNCTION PANEL
ALT: 25 U/L (ref 14–54)
AST: 25 U/L (ref 15–41)
Albumin: 3.8 g/dL (ref 3.5–5.0)
Alkaline Phosphatase: 247 U/L — ABNORMAL HIGH (ref 38–126)
Bilirubin, Direct: <0.1 mg/dL — ABNORMAL LOW (ref 0.1–0.5)
Total Bilirubin: 0.9 mg/dL (ref 0.3–1.2)
Total Protein: 9.2 g/dL — ABNORMAL HIGH (ref 6.5–8.1)

## 2016-07-11 LAB — TROPONIN I: Troponin I: <0.03 ng/mL (ref <0.03)

## 2016-07-11 LAB — LACTIC ACID, PLASMA: Lactic Acid, Venous: 1.1 mmol/L (ref 0.5–1.9)

## 2016-07-11 LAB — LIPASE, BLOOD: Lipase: 26 U/L (ref 11–51)

## 2016-07-11 MED ORDER — GI COCKTAIL ~~LOC~~
30.0000 mL | Freq: Once | ORAL | Status: AC
  Administered 2016-07-11: 30 mL via ORAL
  Filled 2016-07-11: qty 30

## 2016-07-11 MED ORDER — METOCLOPRAMIDE HCL 5 MG/ML IJ SOLN
10.0000 mg | Freq: Once | INTRAMUSCULAR | Status: AC
  Administered 2016-07-11: 10 mg via INTRAVENOUS
  Filled 2016-07-11: qty 2

## 2016-07-11 MED ORDER — PROMETHAZINE HCL 25 MG/ML IJ SOLN
12.5000 mg | Freq: Once | INTRAMUSCULAR | Status: AC
  Administered 2016-07-11: 12.5 mg via INTRAVENOUS
  Filled 2016-07-11: qty 1

## 2016-07-11 MED ORDER — MORPHINE SULFATE (PF) 2 MG/ML IV SOLN
4.0000 mg | INTRAVENOUS | Status: DC | PRN
  Administered 2016-07-11: 4 mg via INTRAVENOUS
  Filled 2016-07-11: qty 2

## 2016-07-11 MED ORDER — HALOPERIDOL LACTATE 5 MG/ML IJ SOLN
2.0000 mg | Freq: Once | INTRAMUSCULAR | Status: AC
  Administered 2016-07-11: 2 mg via INTRAVENOUS
  Filled 2016-07-11: qty 1

## 2016-07-11 MED ORDER — MAGNESIUM SULFATE 2 GM/50ML IV SOLN
2.0000 g | Freq: Once | INTRAVENOUS | Status: AC
  Administered 2016-07-11: 2 g via INTRAVENOUS
  Filled 2016-07-11: qty 50

## 2016-07-11 NOTE — ED Triage Notes (Signed)
Patient arrives to [facility] ED via [facility] with c/o N/V and abdominal cramping. Patient states symptoms started today and worsened after her Dialysis treatment at [facility] Patient had 3l removed total.

## 2016-07-11 NOTE — ED Provider Notes (Signed)
[facility] Emergency Department Provider Note    None    (approximate)  I have reviewed the triage vital signs and the nursing notes.   HISTORY  Chief Complaint Abdominal Cramping; Nausea; and Emesis    HPI [name] is a 55 y.o. female  with multiple chronic medical conditions presents with complaint of epigastric pain nausea vomiting that started today after dialysis. Patient to dialysis without any issues. States that she had 3 L removed in total. States that she's also had recurrent episodes of epigastric pain nausea and vomiting secondary to recurrent pancreatitis and gastroparesis. Patient denies any fevers but currently feels cold. Denies any chest pain or shortness of breath. Denies any orthopnea. Denies any pain radiating through to her back. Denies any diarrhea, melena or hematochezia. Denies any dysuria.   Past Medical History:  Diagnosis Date  . Anemia   . Anxiety   . Arthritis   . Asthma   . chronic diastolic CHF 7/82/9562  . Collagen vascular disease ([place])   . COPD (chronic obstructive pulmonary disease) ([place])   . Coronary artery disease    a. cath 2013: stenting to RCA (report not available); b. cath 2014: LM nl, pLAD 40%, mLAD nl, ost LCx 40%, mid LCx nl, pRCA 30% @ site of prior stent, mRCA 50%  . Depression   . Diabetes mellitus without complication ([place])   . Diabetic neuropathy ([place])   . dialysis 2006  . ESRD (end stage renal disease) on dialysis ([place])    M-W-F  . GERD (gastroesophageal reflux disease)   . Headache   . Hx of pancreatitis 2015  . Hypertension   . Mitral regurgitation    a. echo 10/2013: EF 62%, noWMA, mildly dilated LA, mild to mod MR/TR, GR1DD  . Myocardial infarction   . Pneumonia   . Renal insufficiency     Patient Active Problem List   Diagnosis Date Noted  . TIA (transient ischemic attack) 04/21/2016  . Altered mental status 04/08/2016  . Hyperammonemia ([place]) 04/08/2016  . Elevated troponin  04/08/2016  . ESRD (end stage renal disease) ([place]) 04/08/2016  . Depression 04/08/2016  . Depression, major, recurrent, severe with psychosis ([place]) 04/08/2016  . Blood in stool   . Intractable cyclical vomiting with nausea   . Reflux esophagitis   . Gastritis   . Generalized abdominal pain   . Uncontrollable vomiting   . Major depressive disorder, recurrent episode, moderate ([place]) 03/15/2016  . Adjustment disorder with mixed anxiety and depressed mood 03/15/2016  . Malnutrition of moderate degree 12/01/2015  . Renal mass   . Dyspnea   . Acute renal failure ([place])   . Respiratory failure ([place])   . High temperature 11/14/2015  . Pulmonary edema   . Encounter for central line placement   . Encounter for orogastric (OG) tube placement   . Nausea 11/12/2015  . Hyperkalemia 10/03/2015  . C. difficile diarrhea 07/22/2015  . Pneumonia 05/21/2015  . Hypoglycemia 04/24/2015  . Unresponsiveness 04/24/2015  . Bradycardia 04/24/2015  . Hypothermia 04/24/2015  . Acute respiratory failure ([place]) 04/24/2015  . Acute diastolic CHF (congestive heart failure) ([place]) 04/05/2015  . Diabetic gastroparesis ([place]) 04/05/2015  . Hypokalemia 04/05/2015  . Generalized weakness 04/05/2015  . Acute pulmonary edema ([place]) 04/03/2015  . Nausea and vomiting 04/03/2015  . Hypoglycemia associated with diabetes ([place]) 04/03/2015  . Anemia of chronic disease 04/03/2015  . Secondary hyperparathyroidism ([place]) 04/03/2015  . Pressure ulcer 04/02/2015  . Acute respiratory failure  with hypoxia ([place]) 04/01/2015  . Adjustment disorder with anxiety 03/14/2015  . Somatic symptom disorder, mild 03/08/2015  . Coronary artery disease involving native coronary artery of native heart without angina pectoris   . Nausea & vomiting 03/06/2015  . Abdominal pain 03/06/2015  . DM (diabetes mellitus) ([place]) 03/06/2015  . HTN (hypertension) 03/06/2015  . Gastroparesis 02/24/2015  . Pleural effusion 02/19/2015  . HCAP  (healthcare-associated pneumonia) 02/19/2015  . End-stage renal disease on hemodialysis ([place]) 02/19/2015    Past Surgical History:  Procedure Laterality Date  . ABDOMINAL HYSTERECTOMY    . APPENDECTOMY    . CARDIAC CATHETERIZATION Left 07/26/2015   Procedure: Left Heart Cath and Coronary Angiography;  Surgeon: Dionisio David, MD;  Location: [place] [facility];  Service: Cardiovascular;  Laterality: Left;  . CHOLECYSTECTOMY    . DIALYSIS FISTULA CREATION Left    upper arm  . ESOPHAGOGASTRODUODENOSCOPY N/A 03/08/2015   Procedure: ESOPHAGOGASTRODUODENOSCOPY (EGD);  Surgeon: Manya Silvas, MD;  Location: [facility] ENDOSCOPY;  Service: Endoscopy;  Laterality: N/A;  . ESOPHAGOGASTRODUODENOSCOPY (EGD) WITH PROPOFOL N/A 03/18/2016   Procedure: ESOPHAGOGASTRODUODENOSCOPY (EGD) WITH PROPOFOL;  Surgeon: [name], MD;  Location: [facility] ENDOSCOPY;  Service: Endoscopy;  Laterality: N/A;  . EYE SURGERY    . FECAL TRANSPLANT N/A 08/23/2015   Procedure: FECAL TRANSPLANT;  Surgeon: Manya Silvas, MD;  Location: [facility] ENDOSCOPY;  Service: Endoscopy;  Laterality: N/A;  . PERIPHERAL VASCULAR CATHETERIZATION N/A 12/20/2015   Procedure: Thrombectomy of dialysis access versus permcath placement;  Surgeon: Algernon Huxley, MD;  Location: [place] [facility];  Service: Cardiovascular;  Laterality: N/A;  . PERIPHERAL VASCULAR CATHETERIZATION N/A 12/20/2015   Procedure: A/V Shunt Intervention;  Surgeon: Algernon Huxley, MD;  Location: [place] [facility];  Service: Cardiovascular;  Laterality: N/A;  . PERIPHERAL VASCULAR CATHETERIZATION N/A 12/20/2015   Procedure: A/V Shuntogram/Fistulagram;  Surgeon: Algernon Huxley, MD;  Location: [place] [facility];  Service: Cardiovascular;  Laterality: N/A;  . PERIPHERAL VASCULAR CATHETERIZATION N/A 01/02/2016   Procedure: A/V Shuntogram/Fistulagram;  Surgeon: Algernon Huxley, MD;  Location: [place] [facility];  Service: Cardiovascular;  Laterality: N/A;  . PERIPHERAL VASCULAR CATHETERIZATION  N/A 01/02/2016   Procedure: A/V Shunt Intervention;  Surgeon: Algernon Huxley, MD;  Location: [place] [facility];  Service: Cardiovascular;  Laterality: N/A;    Prior to Admission medications   Medication Sig Start Date End Date Taking? Authorizing Provider  acidophilus (RISAQUAD) CAPS capsule Take 1 capsule by mouth daily.   Yes Historical Provider, MD  albuterol (PROVENTIL HFA;VENTOLIN HFA) 108 (90 Base) MCG/ACT inhaler Inhale 2 puffs into the lungs every 4 (four) hours as needed for wheezing or shortness of breath.   Yes Historical Provider, MD  albuterol (PROVENTIL) (2.5 MG/3ML) 0.083% nebulizer solution Take 2.5 mg by nebulization every 4 (four) hours as needed for wheezing or shortness of breath.   Yes Historical Provider, MD  amLODipine (NORVASC) 10 MG tablet Take 10 mg by mouth daily.   Yes Historical Provider, MD  atorvastatin (LIPITOR) 20 MG tablet Take 20 mg by mouth every evening.   Yes Historical Provider, MD  cetirizine (ZYRTEC) 10 MG tablet Take 10 mg by mouth daily.    Yes Historical Provider, MD  FLUoxetine (PROZAC) 20 MG capsule Take 1 capsule (20 mg total) by mouth daily. 04/10/16  Yes Lytle Butte, MD  glucose 4 GM chewable tablet Chew 3-4 tablets by mouth as needed for low blood sugar.   Yes Historical Provider, MD  hydrALAZINE (APRESOLINE) 25 MG tablet Take 25 mg by mouth 3 (three) times daily.   Yes Historical Provider, MD  HYDROcodone-acetaminophen (NORCO/VICODIN) 5-325 MG tablet Take 1 tablet by mouth every 6 (six) hours as needed for moderate pain or severe pain. 04/29/16  Yes Loletha Grayer, MD  lipase/protease/amylase (CREON) 12000 UNITS CPEP capsule Take 12,000-36,000 Units by mouth See admin instructions. 36,000 units daily with breakfast; 24,000 units daily with lunch, and 36,000 units daily with dinner   Yes Historical Provider, MD  meclizine (ANTIVERT) 12.5 MG tablet Take 1 tablet (12.5 mg total) by mouth 3 (three) times daily as needed for dizziness. 04/29/16  Yes Loletha Grayer, MD  multivitamin (RENA-VIT) TABS tablet Take 1 tablet by mouth daily.   Yes Historical Provider, MD  OLANZapine (ZYPREXA) 5 MG tablet Take 0.5 tablets (2.5 mg total) by mouth at bedtime. 04/29/16  Yes Loletha Grayer, MD  omega-3 acid ethyl esters (LOVAZA) 1 g capsule Take 1 g by mouth daily.   Yes Historical Provider, MD  pantoprazole (PROTONIX) 40 MG tablet Take 1 tablet (40 mg total) by mouth 2 (two) times daily before a meal. 03/19/16  Yes Gladstone Lighter, MD  pregabalin (LYRICA) 25 MG capsule Take 25 mg by mouth daily.   Yes Historical Provider, MD  promethazine (PHENERGAN) 12.5 MG tablet Take 12.5 mg by mouth every 8 (eight) hours as needed for nausea.   Yes Historical Provider, MD  ranitidine (ZANTAC) 150 MG tablet Take 150 mg by mouth daily.    Yes Historical Provider, MD  sevelamer carbonate (RENVELA) 800 MG tablet Take 800 mg by mouth 3 (three) times daily with meals.    Yes Historical Provider, MD  vitamin B-12 (CYANOCOBALAMIN) 1000 MCG tablet Take 1,000 mcg by mouth daily.   Yes Historical Provider, MD    Allergies Compazine [prochlorperazine edisylate]; Ace inhibitors; Ativan [lorazepam]; Codeine; Dilaudid [hydromorphone hcl]; Gabapentin; Losartan; Ondansetron; Prochlorperazine; Reglan [metoclopramide]; Scopolamine; Zofran [ondansetron hcl]; Oxycodone; and Tape  Family History  Problem Relation Age of Onset  . Kidney disease Mother   . Diabetes Mother     Social History Social History  Substance Use Topics  . Smoking status: Former Smoker    Packs/day: 0.50    Types: Cigarettes    Quit date: 02/13/2015  . Smokeless tobacco: Never Used  . Alcohol use No    Review of Systems Patient denies headaches, rhinorrhea, blurry vision, numbness, shortness of breath, chest pain, edema, cough, abdominal pain, nausea, vomiting, diarrhea, dysuria, fevers, rashes or hallucinations unless otherwise stated above in HPI. ____________________________________________   PHYSICAL  EXAM:  VITAL SIGNS: Vitals:   07/11/16 2000 07/11/16 2015  BP: 134/61   Pulse: 73 73  Resp: 17 18  Temp:     Constitutional: Alert and oriented. Well appearing and in no acute distress. Eyes: Conjunctivae are normal. PERRL. EOMI. Head: Atraumatic. Nose: No congestion/rhinnorhea. Mouth/Throat: Mucous membranes are moist.  Oropharynx non-erythematous. Neck: No stridor. Painless ROM. No cervical spine tenderness to palpation Hematological/Lymphatic/Immunilogical: No cervical lymphadenopathy. Cardiovascular: Normal rate, regular rhythm. Grossly normal heart sounds.  Good peripheral circulation. Respiratory: Normal respiratory effort.  No retractions. Lungs CTAB. Gastrointestinal: Soft but with ruq ttp, no overt peritonitis. No distention. No abdominal bruits. No CVA tenderness. Genitourinary:  Musculoskeletal: No lower extremity tenderness nor edema.  No joint effusions. Neurologic:  Normal speech and language. No gross focal neurologic deficits are appreciated. No gait instability. Skin:  Skin is warm, dry and intact. Bilateral LE venous stasis changes Psychiatric: Mood and affect  are normal. Speech and behavior are normal.  ____________________________________________   LABS (all labs ordered are listed, but only abnormal results are displayed)  Results for orders placed or performed during the hospital encounter of 07/11/16 (from the past 24 hour(s))  Magnesium     Status: None   Collection Time: 07/11/16  4:00 PM  Result Value Ref Range   Magnesium 2.2 1.7 - 2.4 mg/dL  CBC with Differential/Platelet     Status: Abnormal   Collection Time: 07/11/16  4:02 PM  Result Value Ref Range   WBC 8.1 3.6 - 11.0 K/uL   RBC 4.80 3.80 - 5.20 MIL/uL   Hemoglobin 12.4 12.0 - 16.0 g/dL   HCT 37.3 35.0 - 47.0 %   MCV 77.9 (L) 80.0 - 100.0 fL   MCH 25.8 (L) 26.0 - 34.0 pg   MCHC 33.1 32.0 - 36.0 g/dL   RDW 20.7 (H) 11.5 - 14.5 %   Platelets 193 150 - 440 K/uL   Neutrophils Relative % 74  %   Neutro Abs 6.0 1.4 - 6.5 K/uL   Lymphocytes Relative 15 %   Lymphs Abs 1.2 1.0 - 3.6 K/uL   Monocytes Relative 9 %   Monocytes Absolute 0.7 0.2 - 0.9 K/uL   Eosinophils Relative 2 %   Eosinophils Absolute 0.2 0 - 0.7 K/uL   Basophils Relative 0 %   Basophils Absolute 0.0 0 - 0.1 K/uL  Lactic acid, plasma     Status: None   Collection Time: 07/11/16  4:02 PM  Result Value Ref Range   Lactic Acid, Venous 1.1 0.5 - 1.9 mmol/L  Basic metabolic panel     Status: Abnormal   Collection Time: 07/11/16  4:02 PM  Result Value Ref Range   Sodium 136 135 - 145 mmol/L   Potassium 3.5 3.5 - 5.1 mmol/L   Chloride 93 (L) 101 - 111 mmol/L   CO2 31 22 - 32 mmol/L   Glucose, Bld 152 (H) 65 - 99 mg/dL   BUN 13 6 - 20 mg/dL   Creatinine, Ser 2.61 (H) 0.44 - 1.00 mg/dL   Calcium 9.0 8.9 - 10.3 mg/dL   GFR calc non Af Amer 19 (L) >60 mL/min   GFR calc Af Amer 22 (L) >60 mL/min   Anion gap 12 5 - 15  Hepatic function panel     Status: Abnormal   Collection Time: 07/11/16  4:02 PM  Result Value Ref Range   Total Protein 9.2 (H) 6.5 - 8.1 g/dL   Albumin 3.8 3.5 - 5.0 g/dL   AST 25 15 - 41 U/L   ALT 25 14 - 54 U/L   Alkaline Phosphatase 247 (H) 38 - 126 U/L   Total Bilirubin 0.9 0.3 - 1.2 mg/dL   Bilirubin, Direct <0.1 (L) 0.1 - 0.5 mg/dL   Indirect Bilirubin NOT CALCULATED 0.3 - 0.9 mg/dL  Troponin I     Status: None   Collection Time: 07/11/16  4:02 PM  Result Value Ref Range   Troponin I <0.03 <0.03 ng/mL  Lipase, blood     Status: None   Collection Time: 07/11/16  4:04 PM  Result Value Ref Range   Lipase 26 11 - 51 U/L   ____________________________________________  EKG My review and personal interpretation at Time: 16:03   Indication: nausea  Rate: 75  Rhythm: sinus Axis: normal Other: no acute ischemia, nonspecific st changes, mildly prolonged QT ____________________________________________  RADIOLOGY  I personally reviewed all radiographic images ordered  to evaluate for the  above acute complaints and reviewed radiology reports and findings.  These findings were personally discussed with the patient.  Please see medical record for radiology report.  ____________________________________________   PROCEDURES  Procedure(s) performed: none    Critical Care performed: no ____________________________________________   INITIAL IMPRESSION / ASSESSMENT AND PLAN / ED COURSE  Pertinent labs & imaging results that were available during my care of the patient were reviewed by me and considered in my medical decision making (see chart for details).  DDX: gastroparesis, pancreatitis, hepatitis, colitis, pna, acs  [name] is a 55 y.o. who presents to the ED with epigastric discomfort and nausea and vomiting. Patient arrives afebrile and otherwise hemodynamic stable. She is well-perfused. Does have mild tenderness to epigastrium the patient is status post cholecystectomy. No diarrhea. No fevers to suggest a diverticulitis. She status post appendectomy. She has no hypoxia or evidence of pneumonia. Patient is a diabetic with multiple presentations for similar issues were found to be secondary to gastroparesis. Will order x-ray imaging to evaluate for any acute obstructive process. We'll provide symptomatic IV medications.  The patient will be placed on continuous pulse oximetry and telemetry for monitoring.  Laboratory evaluation will be sent to evaluate for the above complaints.     Clinical Course  Comment By Time  Lipase 26.  Not consistent with acute pancreatitis.  CBC reassuring.  Patient more comfortable after phenergen.   Merlyn Lot, MD 10/20 1641  Blood work is reassuring. Merlyn Lot, MD 10/20 1736  Patient reassessed and states pain has improved. Currently requesting ginger ale. Repeat abdominal exam is reassuring. I do suspect this is likely secondary to chronic gastroparesis.  Patient was able to tolerate PO and was able to ambulate with a steady  gait.  Have discussed with the patient and available family all diagnostics and treatments performed thus far and all questions were answered to the best of my ability. The patient demonstrates understanding and agreement with plan.   Merlyn Lot, MD 10/20 1927     ____________________________________________   FINAL CLINICAL IMPRESSION(S) / ED DIAGNOSES  Final diagnoses:  Generalized abdominal pain  Nausea      NEW MEDICATIONS STARTED DURING THIS VISIT:  Discharge Medication List as of 07/11/2016  7:32 PM       Note:  This document was prepared using Dragon voice recognition software and may include unintentional dictation errors.    Merlyn Lot, MD 07/12/16 [phone]

## 2016-07-11 NOTE — ED Notes (Signed)
Patient c/o pain 10/10

## 2016-07-13 ENCOUNTER — Emergency Department
Admission: EM | Admit: 2016-07-13 | Discharge: 2016-07-13 | Disposition: A | Discharge status: Home / Self Care | Payer: Medicare Other | Attending: Emergency Medicine | Admitting: Emergency Medicine

## 2016-07-13 ENCOUNTER — Emergency Department: Payer: Medicare Other

## 2016-07-13 DIAGNOSIS — J45909 Unspecified asthma, uncomplicated: Secondary | ICD-10-CM | POA: Insufficient documentation

## 2016-07-13 DIAGNOSIS — I5032 Chronic diastolic (congestive) heart failure: Secondary | ICD-10-CM | POA: Diagnosis not present

## 2016-07-13 DIAGNOSIS — N186 End stage renal disease: Secondary | ICD-10-CM | POA: Insufficient documentation

## 2016-07-13 DIAGNOSIS — I252 Old myocardial infarction: Secondary | ICD-10-CM | POA: Insufficient documentation

## 2016-07-13 DIAGNOSIS — E1122 Type 2 diabetes mellitus with diabetic chronic kidney disease: Secondary | ICD-10-CM | POA: Diagnosis not present

## 2016-07-13 DIAGNOSIS — R1013 Epigastric pain: Secondary | ICD-10-CM | POA: Insufficient documentation

## 2016-07-13 DIAGNOSIS — Z79899 Other long term (current) drug therapy: Secondary | ICD-10-CM | POA: Diagnosis not present

## 2016-07-13 DIAGNOSIS — Z992 Dependence on renal dialysis: Secondary | ICD-10-CM | POA: Insufficient documentation

## 2016-07-13 DIAGNOSIS — J449 Chronic obstructive pulmonary disease, unspecified: Secondary | ICD-10-CM | POA: Diagnosis not present

## 2016-07-13 DIAGNOSIS — R1010 Upper abdominal pain, unspecified: Secondary | ICD-10-CM | POA: Diagnosis present

## 2016-07-13 DIAGNOSIS — I132 Hypertensive heart and chronic kidney disease with heart failure and with stage 5 chronic kidney disease, or end stage renal disease: Secondary | ICD-10-CM | POA: Insufficient documentation

## 2016-07-13 DIAGNOSIS — Z87891 Personal history of nicotine dependence: Secondary | ICD-10-CM | POA: Insufficient documentation

## 2016-07-13 DIAGNOSIS — Z794 Long term (current) use of insulin: Secondary | ICD-10-CM | POA: Insufficient documentation

## 2016-07-13 DIAGNOSIS — R112 Nausea with vomiting, unspecified: Secondary | ICD-10-CM | POA: Insufficient documentation

## 2016-07-13 LAB — BASIC METABOLIC PANEL
Anion gap: 11 (ref 5–15)
BUN: 45 mg/dL — ABNORMAL HIGH (ref 6–20)
CO2: 30 mmol/L (ref 22–32)
Calcium: 8.5 mg/dL — ABNORMAL LOW (ref 8.9–10.3)
Chloride: 90 mmol/L — ABNORMAL LOW (ref 101–111)
Creatinine, Ser: 5.44 mg/dL — ABNORMAL HIGH (ref 0.44–1.00)
GFR calc Af Amer: 9 mL/min — ABNORMAL LOW (ref >60)
GFR calc non Af Amer: 8 mL/min — ABNORMAL LOW (ref >60)
Glucose, Bld: 146 mg/dL — ABNORMAL HIGH (ref 65–99)
Potassium: 4.6 mmol/L (ref 3.5–5.1)
Sodium: 131 mmol/L — ABNORMAL LOW (ref 135–145)

## 2016-07-13 LAB — CBC WITH DIFFERENTIAL/PLATELET
Basophils Absolute: 0 10*3/uL (ref 0–0.1)
Basophils Relative: 1 %
Eosinophils Absolute: 0.2 10*3/uL (ref 0–0.7)
Eosinophils Relative: 3 %
HCT: 35.2 % (ref 35.0–47.0)
Hemoglobin: 11.4 g/dL — ABNORMAL LOW (ref 12.0–16.0)
Lymphocytes Relative: 20 %
Lymphs Abs: 1.6 10*3/uL (ref 1.0–3.6)
MCH: 25.3 pg — ABNORMAL LOW (ref 26.0–34.0)
MCHC: 32.3 g/dL (ref 32.0–36.0)
MCV: 78.5 fL — ABNORMAL LOW (ref 80.0–100.0)
Monocytes Absolute: 0.7 10*3/uL (ref 0.2–0.9)
Monocytes Relative: 10 %
Neutro Abs: 5.2 10*3/uL (ref 1.4–6.5)
Neutrophils Relative %: 66 %
Platelets: 173 10*3/uL (ref 150–440)
RBC: 4.48 MIL/uL (ref 3.80–5.20)
RDW: 21.4 % — ABNORMAL HIGH (ref 11.5–14.5)
WBC: 7.8 10*3/uL (ref 3.6–11.0)

## 2016-07-13 LAB — TROPONIN I: Troponin I: <0.03 ng/mL (ref <0.03)

## 2016-07-13 LAB — LIPASE, BLOOD: Lipase: 23 U/L (ref 11–51)

## 2016-07-13 MED ORDER — MORPHINE SULFATE (PF) 2 MG/ML IV SOLN
4.0000 mg | Freq: Once | INTRAVENOUS | Status: AC
  Administered 2016-07-13: 4 mg via INTRAVENOUS
  Filled 2016-07-13: qty 2

## 2016-07-13 MED ORDER — PROMETHAZINE HCL 25 MG RE SUPP: 25.0000 mg | Freq: Four times a day (QID) | RECTAL | 0 refills | Status: DC | PRN

## 2016-07-13 MED ORDER — PROMETHAZINE HCL 25 MG/ML IJ SOLN
12.5000 mg | Freq: Once | INTRAMUSCULAR | Status: AC
  Administered 2016-07-13: 12.5 mg via INTRAVENOUS
  Filled 2016-07-13: qty 1

## 2016-07-13 NOTE — ED Notes (Signed)
Pt resting in bed at this time eye closed

## 2016-07-13 NOTE — ED Provider Notes (Signed)
-----------------------------------------   4:00 PM on 07/13/2016 -----------------------------------------   Blood pressure (!) 127/59, pulse 63, temperature 97.4 F (36.3 C), temperature source Oral, resp. rate 16, height 5\' 5"  (1.651 m), weight155 lb (70.3 kg), SpO2 97 %.  Assuming care from Dr. Claudette Head.  In short, [name] is a 55 y.o. female with a chief complaint of Abdominal Pain .  Refer to the original H&P for additional details.  The current plan of care is to follow the patient and her progress here in the emergency department. Patient still has abdominal pain but has had a thorough evaluation and then seemingly has chronic abdominal pain with persistent nausea and vomiting. Patient was prescribed Phenergan suppositories and otherwise is hemodynamically stable for discharge at this point.   Daymon Larsen, MD 07/13/16 [phone]

## 2016-07-13 NOTE — ED Notes (Signed)
Pt PO challenged with crackers and ginger ale.

## 2016-07-13 NOTE — ED Triage Notes (Signed)
Pt came to ED via EMS from [facility] c/o n/v and abdominal pain. Last dialysis treatment Friday.

## 2016-07-13 NOTE — ED Provider Notes (Signed)
[facility] Emergency Department Provider Note   ____________________________________________   First MD Initiated Contact with Patient 07/13/16 1044     (approximate)  I have reviewed the triage vital signs and the nursing notes.   HISTORY  Chief Complaint Abdominal Pain   HPI [name] is a 55 y.o. female with a history of end-stage renal disease on dialysis as well as gastroparesis was presenting with upper abdominal pain. She was here 2 days ago and was given Phenergan as well as morphine and fluids and was discharged home with Phenergan. However, since being discharged she's had persistent nausea and vomiting. The patient denies any diarrhea. Says that she is a 10 out of 10 upper abdominal pain which she describes as cramping and nonradiating. Denies any chest pain or shortness of breath. Denies any diarrhea. Does not report any blood in her vomitus.   Past Medical History:  Diagnosis Date  . Anemia   . Anxiety   . Arthritis   . Asthma   . chronic diastolic CHF 0/09/7492  . Collagen vascular disease ([place])   . COPD (chronic obstructive pulmonary disease) ([place])   . Coronary artery disease    a. cath 2013: stenting to RCA (report not available); b. cath 2014: LM nl, pLAD 40%, mLAD nl, ost LCx 40%, mid LCx nl, pRCA 30% @ site of prior stent, mRCA 50%  . Depression   . Diabetes mellitus without complication ([place])   . Diabetic neuropathy ([place])   . dialysis 2006  . ESRD (end stage renal disease) on dialysis ([place])    M-W-F  . GERD (gastroesophageal reflux disease)   . Headache   . Hx of pancreatitis 2015  . Hypertension   . Mitral regurgitation    a. echo 10/2013: EF 62%, noWMA, mildly dilated LA, mild to mod MR/TR, GR1DD  . Myocardial infarction   . Pneumonia   . Renal insufficiency     Patient Active Problem List   Diagnosis Date Noted  . TIA (transient ischemic attack) 04/21/2016  . Altered mental status 04/08/2016  . Hyperammonemia  ([place]) 04/08/2016  . Elevated troponin 04/08/2016  . ESRD (end stage renal disease) ([place]) 04/08/2016  . Depression 04/08/2016  . Depression, major, recurrent, severe with psychosis ([place]) 04/08/2016  . Blood in stool   . Intractable cyclical vomiting with nausea   . Reflux esophagitis   . Gastritis   . Generalized abdominal pain   . Uncontrollable vomiting   . Major depressive disorder, recurrent episode, moderate ([place]) 03/15/2016  . Adjustment disorder with mixed anxiety and depressed mood 03/15/2016  . Malnutrition of moderate degree 12/01/2015  . Renal mass   . Dyspnea   . Acute renal failure ([place])   . Respiratory failure ([place])   . High temperature 11/14/2015  . Pulmonary edema   . Encounter for central line placement   . Encounter for orogastric (OG) tube placement   . Nausea 11/12/2015  . Hyperkalemia 10/03/2015  . C. difficile diarrhea 07/22/2015  . Pneumonia 05/21/2015  . Hypoglycemia 04/24/2015  . Unresponsiveness 04/24/2015  . Bradycardia 04/24/2015  . Hypothermia 04/24/2015  . Acute respiratory failure ([place]) 04/24/2015  . Acute diastolic CHF (congestive heart failure) ([place]) 04/05/2015  . Diabetic gastroparesis ([place]) 04/05/2015  . Hypokalemia 04/05/2015  . Generalized weakness 04/05/2015  . Acute pulmonary edema ([place]) 04/03/2015  . Nausea and vomiting 04/03/2015  . Hypoglycemia associated with diabetes ([place]) 04/03/2015  . Anemia of chronic disease 04/03/2015  . Secondary hyperparathyroidism ([place])  04/03/2015  . Pressure ulcer 04/02/2015  . Acute respiratory failure with hypoxia ([place]) 04/01/2015  . Adjustment disorder with anxiety 03/14/2015  . Somatic symptom disorder, mild 03/08/2015  . Coronary artery disease involving native coronary artery of native heart without angina pectoris   . Nausea & vomiting 03/06/2015  . Abdominal pain 03/06/2015  . DM (diabetes mellitus) ([place]) 03/06/2015  . HTN (hypertension) 03/06/2015  . Gastroparesis 02/24/2015  . Pleural  effusion 02/19/2015  . HCAP (healthcare-associated pneumonia) 02/19/2015  . End-stage renal disease on hemodialysis ([place]) 02/19/2015    Past Surgical History:  Procedure Laterality Date  . ABDOMINAL HYSTERECTOMY    . APPENDECTOMY    . CARDIAC CATHETERIZATION Left 07/26/2015   Procedure: Left Heart Cath and Coronary Angiography;  Surgeon: Dionisio , MD;  Location: [place] CV LAB;  Service: Cardiovascular;  Laterality: Left;  . CHOLECYSTECTOMY    . DIALYSIS FISTULA CREATION Left    upper arm  . ESOPHAGOGASTRODUODENOSCOPY N/A 03/08/2015   Procedure: ESOPHAGOGASTRODUODENOSCOPY (EGD);  Surgeon: Manya Silvas, MD;  Location: [facility] ENDOSCOPY;  Service: Endoscopy;  Laterality: N/A;  . ESOPHAGOGASTRODUODENOSCOPY (EGD) WITH PROPOFOL N/A 03/18/2016   Procedure: ESOPHAGOGASTRODUODENOSCOPY (EGD) WITH PROPOFOL;  Surgeon: [name], MD;  Location: [facility] ENDOSCOPY;  Service: Endoscopy;  Laterality: N/A;  . EYE SURGERY    . FECAL TRANSPLANT N/A 08/23/2015   Procedure: FECAL TRANSPLANT;  Surgeon: Manya Silvas, MD;  Location[facility] ENDOSCOPY;  Service: Endoscopy;  Laterality: N/A;  . PERIPHERAL VASCULAR CATHETERIZATION N/A 12/20/2015   Procedure: Thrombectomy of dialysis access versus permcath placement;  Surgeon: Algernon Huxley, MD;  Location: [place] CV LAB;  Service: Cardiovascular;  Laterality: N/A;  . PERIPHERAL VASCULAR CATHETERIZATION N/A 12/20/2015   Procedure: A/V Shunt Intervention;  Surgeon: Algernon Huxley, MD;  Location: [place] CV LAB;  Service: Cardiovascular;  Laterality: N/A;  . PERIPHERAL VASCULAR CATHETERIZATION N/A 12/20/2015   Procedure: A/V Shuntogram/Fistulagram;  Surgeon: Algernon Huxley, MD;  Location: [place] [facility] LAB;  Service: Cardiovascular;  Laterality: N/A;  . PERIPHERAL VASCULAR CATHETERIZATION N/A 01/02/2016   Procedure: A/V Shuntogram/Fistulagram;  Surgeon: Algernon Huxley, MD;  Location: [place] CV LAB;  Service: Cardiovascular;  Laterality: N/A;  .  PERIPHERAL VASCULAR CATHETERIZATION N/A 01/02/2016   Procedure: A/V Shunt Intervention;  Surgeon: Algernon Huxley, MD;  Location: [place] [facility] LAB;  Service: Cardiovascular;  Laterality: N/A;    Prior to Admission medications   Medication Sig Start Date End Date Taking? Authorizing Provider  acidophilus (RISAQUAD) CAPS capsule Take 1 capsule by mouth daily.   Yes Historical Provider, MD  albuterol (PROVENTIL HFA;VENTOLIN HFA) 108 (90 Base) MCG/ACT inhaler Inhale 2 puffs into the lungs every 4 (four) hours as needed for shortness of breath.    Yes Historical Provider, MD  albuterol (PROVENTIL) (2.5 MG/3ML) 0.083% nebulizer solution Take 2.5 mg by nebulization every 4 (four) hours as needed for wheezing or shortness of breath.   Yes Historical Provider, MD  amLODipine (NORVASC) 10 MG tablet Take 10 mg by mouth daily.   Yes Historical Provider, MD  atorvastatin (LIPITOR) 20 MG tablet Take 20 mg by mouth at bedtime.    Yes Historical Provider, MD  cefTRIAXone (ROCEPHIN) 1 g injection Inject 1 g into the muscle at bedtime. 07/08/16 07/15/16 Yes Historical Provider, MD  cetirizine (ZYRTEC) 10 MG tablet Take 10 mg by mouth daily.    Yes Historical Provider, MD  diclofenac sodium (VOLTAREN) 1 % GEL Apply 2 g topically 4 (four) times  daily.   Yes Historical Provider, MD  doxycycline (VIBRA-TABS) 100 MG tablet Take 100 mg by mouth 2 (two) times daily. 07/09/16 07/19/16 Yes Historical Provider, MD  FLUoxetine (PROZAC) 20 MG capsule Take 1 capsule (20 mg total) by mouth daily. Patient taking differently: Take 20 mg by mouth at bedtime.  04/10/16  Yes Lytle Butte, MD  glucose 4 GM chewable tablet Chew 3-4 tablets by mouth as needed for low blood sugar.   Yes Historical Provider, MD  hydrALAZINE (APRESOLINE) 25 MG tablet Take 25 mg by mouth 3 (three) times daily.   Yes Historical Provider, MD  HYDROcodone-acetaminophen (NORCO/VICODIN) 5-325 MG tablet Take 1 tablet by mouth every 6 (six) hours as needed for  moderate pain or severe pain. 04/29/16  Yes Loletha Grayer, MD  hydrOXYzine (ATARAX/VISTARIL) 25 MG tablet Take 25 mg by mouth every 6 (six) hours as needed for itching.   Yes Historical Provider, MD  insulin aspart (NOVOLOG) 100 UNIT/ML injection Inject 0-6 Units into the skin 3 (three) times daily before meals. Per sliding scale   Yes Historical Provider, MD  insulin glargine (LANTUS) 100 UNIT/ML injection Inject 8 Units into the skin at bedtime.   Yes Historical Provider, MD  lamoTRIgine (LAMICTAL) 25 MG tablet Take 25 mg by mouth at bedtime.   Yes Historical Provider, MD  lipase/protease/amylase (CREON) 12000 UNITS CPEP capsule Take 36,000 Units by mouth 2 (two) times daily.    Yes Historical Provider, MD  lubiprostone (AMITIZA) 24 MCG capsule Take 24 mcg by mouth daily. 07/13/16  Yes Historical Provider, MD  meclizine (ANTIVERT) 12.5 MG tablet Take 1 tablet (12.5 mg total) by mouth 3 (three) times daily as needed for dizziness. Patient taking differently: Take 12.5 mg by mouth every 8 (eight) hours as needed for dizziness.  04/29/16  Yes Loletha Grayer, MD  multivitamin (RENA-VIT) TABS tablet Take 1 tablet by mouth every evening.    Yes Historical Provider, MD  OLANZapine (ZYPREXA) 5 MG tablet Take 0.5 tablets (2.5 mg total) by mouth at bedtime. Patient taking differently: Take 5 mg by mouth at bedtime.  04/29/16  Yes Loletha Grayer, MD  omega-3 acid ethyl esters (LOVAZA) 1 g capsule Take 1 g by mouth at bedtime.   Yes Historical Provider, MD  pantoprazole (PROTONIX) 40 MG tablet Take 1 tablet (40 mg total) by mouth 2 (two) times daily before a meal. 03/19/16  Yes Gladstone Lighter, MD  pregabalin (LYRICA) 25 MG capsule Take 25 mg by mouth daily.   Yes Historical Provider, MD  ranitidine (ZANTAC) 150 MG tablet Take 150 mg by mouth daily. In the afternoon   Yes Historical Provider, MD  rifaximin (XIFAXAN) 550 MG TABS tablet Take 550 mg by mouth 2 (two) times daily.   Yes Historical Provider, MD    sevelamer carbonate (RENVELA) 800 MG tablet Take 2,400 mg by mouth 3 (three) times daily with meals.    Yes Historical Provider, MD  vitamin B-12 (CYANOCOBALAMIN) 500 MCG tablet Take 1,000 mcg by mouth daily.   Yes Historical Provider, MD  promethazine (PHENERGAN) 25 MG suppository Place 1 suppository (25 mg total) rectally every 6 (six) hours as needed for nausea. 07/13/16 07/13/17  Orbie Pyo, MD  promethazine (PHENERGAN) 25 MG suppository Place 1 suppository (25 mg total) rectally every 6 (six) hours as needed for nausea. Stop taking the oral promethazine (phenergan).  Use these suppositories instead. 07/13/16 07/13/17  Orbie Pyo, MD    Allergies Compazine [prochlorperazine edisylate]; Ace inhibitors; Ativan [lorazepam];  Codeine; Dilaudid [hydromorphone hcl]; Gabapentin; Losartan; Ondansetron; Prochlorperazine; Reglan [metoclopramide]; Scopolamine; Zofran [ondansetron hcl]; Oxycodone; and Tape  Family History  Problem Relation Age of Onset  . Kidney disease Mother   . Diabetes Mother     Social History Social History  Substance Use Topics  . Smoking status: Former Smoker    Packs/day: 0.50    Types: Cigarettes    Quit date: 02/13/2015  . Smokeless tobacco: Never Used  . Alcohol use No    Review of Systems Constitutional: No fever/chills Eyes: No visual changes. ENT: No sore throat. Cardiovascular: Denies chest pain. Respiratory: Denies shortness of breath. Gastrointestinal:  No diarrhea.  No constipation. Genitourinary: Negative for dysuria. Musculoskeletal: Negative for back pain. Skin: Negative for rash. Neurological: Negative for headaches, focal weakness or numbness.  10-point ROS otherwise negative.  ____________________________________________   PHYSICAL EXAM:  VITAL SIGNS: ED Triage Vitals  Enc Vitals Group     BP 07/13/16 1043 (!) 119/50     Pulse Rate 07/13/16 1043 62     Resp 07/13/16 1043 16     Temp 07/13/16 1043 97.4 F  (36.3 C)     Temp Source 07/13/16 1043 Oral     SpO2 07/13/16 1043 100 %     Weight 07/13/16 1044 155 lb (70.3 kg)     Height 07/13/16 1044 5\' 5"  (1.651 m)     Head Circumference --      Peak Flow --      Pain Score --      Pain Loc --      Pain Edu? --      Excl. in [place]? --     Constitutional: Alert and oriented. Well appearing and in no acute distress. Eyes: Conjunctivae are normal. PERRL. EOMI. Head: Atraumatic. Nose: No congestion/rhinnorhea. Mouth/Throat: Mucous membranes are moist.   Neck: No stridor.   Cardiovascular: Normal rate, regular rhythm. Grossly normal heart sounds.  Good peripheral circulation with palpable thrill to left upper extremity dialysis access. Respiratory: Normal respiratory effort.  No retractions. Lungs CTAB. Gastrointestinal: Soft with moderate epigastric tenderness palpation. No distention. No CVA tenderness. Musculoskeletal: No lower extremity tenderness nor edema.  No joint effusions. Neurologic:  Normal speech and language. No gross focal neurologic deficits are appreciated. No gait instability. Skin:  Skin is warm, dry and intact. No rash noted. Psychiatric: Mood and affect are normal. Speech and behavior are normal.  ____________________________________________   LABS (all labs ordered are listed, but only abnormal results are displayed)  Labs Reviewed  CBC WITH DIFFERENTIAL/PLATELET - Abnormal; Notable for the following:       Result Value   Hemoglobin 11.4 (*)    MCV 78.5 (*)    MCH 25.3 (*)    RDW 21.4 (*)    All other components within normal limits  BASIC METABOLIC PANEL - Abnormal; Notable for the following:    Sodium 131 (*)    Chloride 90 (*)    Glucose, Bld 146 (*)    BUN 45 (*)    Creatinine, Ser 5.44 (*)    Calcium 8.5 (*)    GFR calc non Af Amer 8 (*)    GFR calc Af Amer 9 (*)    All other components within normal limits  LIPASE, BLOOD  TROPONIN I  CBC WITH DIFFERENTIAL/PLATELET    ____________________________________________  EKG  ED ECG REPORT I, Doran Stabler, the attending physician, personally viewed and interpreted this ECG.   Date: 07/13/2016  EKG Time: 1129  Rate: 60  Rhythm: normal sinus rhythm  Axis: Normal  Intervals:none  ST&T Change: No ST segment elevation or depression. No abnormal T-wave inversion.  ____________________________________________  RADIOLOGY  DG Chest 1 View (Accession [id number]) (Order [id number])  Imaging  Date: 07/13/2016 Department: [facility] EMERGENCY DEPARTMENT Released By/Authorizing: Orbie Pyo, MD (auto-released)  Exam Information   Status Exam Begun  Exam Ended   Final [99] 07/13/2016 11:19 AM 07/13/2016 11:27 AM  PACS Images   Show images for DG Chest 1 View  Study Result   CLINICAL DATA:  Diabetes, epigastric pain  EXAM: CHEST 1 VIEW  COMPARISON:  07/11/2016  FINDINGS: Cardiomediastinal silhouette is stable. No infiltrate or pleural effusion. No pulmonary edema. Stable bilateral basilar chronic atelectasis or scarring. Left subclavian stent is unchanged in position.  IMPRESSION: No active disease.   Electronically Signed   By: Lahoma Crocker M.D.   On: 07/13/2016 11:32     ____________________________________________   PROCEDURES  Procedure(s) performed:   Procedures  Critical Care performed:   ____________________________________________   INITIAL IMPRESSION / ASSESSMENT AND PLAN / ED COURSE  Pertinent labs & imaging results that were available during my care of the patient were reviewed by me and considered in my medical decision making (see chart for details).  ----------------------------------------- 3:56 PM on 07/13/2016 -----------------------------------------  After being medicated the patient has not had any vomiting and at this time is sleeping. Discussed with nurses, Denia as well as Brandy, and she will be by mouth  challenged. Signed out to Dr. Marcelene Butte.  Suspect gastroparesis. Last visit she was discharged with by mouth Phenergan. I will change her prescription to a Phenergan suppository to ensure that she gets the full dose of this medication.  Clinical Course     ____________________________________________   FINAL CLINICAL IMPRESSION(S) / ED DIAGNOSES  Final diagnoses:  Epigastric pain  Non-intractable vomiting with nausea, unspecified vomiting type      NEW MEDICATIONS STARTED DURING THIS VISIT:  New Prescriptions   PROMETHAZINE (PHENERGAN) 25 MG SUPPOSITORY    Place 1 suppository (25 mg total) rectally every 6 (six) hours as needed for nausea.   PROMETHAZINE (PHENERGAN) 25 MG SUPPOSITORY    Place 1 suppository (25 mg total) rectally every 6 (six) hours as needed for nausea. Stop taking the oral promethazine (phenergan).  Use these suppositories instead.     Note:  This document was prepared using Dragon voice recognition software and may include unintentional dictation errors.    Orbie Pyo, MD 07/13/16 1556

## 2016-07-13 NOTE — ED Notes (Signed)
2 attempts unsuccessful by this nurse for IV. Another nurse to attempt IV access.

## 2016-07-13 NOTE — ED Notes (Signed)
Called for EMS transport to [facility] at [facility]

## 2016-07-15 ENCOUNTER — Ambulatory Visit: Admission: RE | Admit: 2016-07-15 | Payer: Medicare Other | Source: Ambulatory Visit

## 2016-07-21 IMAGING — CT CT ABD-PELV W/O CM
2 of 4 series · 15 of 46 positions shown, 17 images · non-contrast
Comparison: 02/16/2016 CT abdomen/ pelvis.

CLINICAL DATA: Right abdominal pain.

EXAM:
CT ABDOMEN AND PELVIS WITHOUT CONTRAST
TECHNIQUE: Multidetector CT imaging of the abdomen and pelvis was performed
following the standard protocol without IV contrast.

[Series 2: routine abd pel wo · axial · 0.68mm/px · z∈[-523,-123]mm · 12 of 88 slices shown, 14 images]
[im 4/88  soft-tissue]
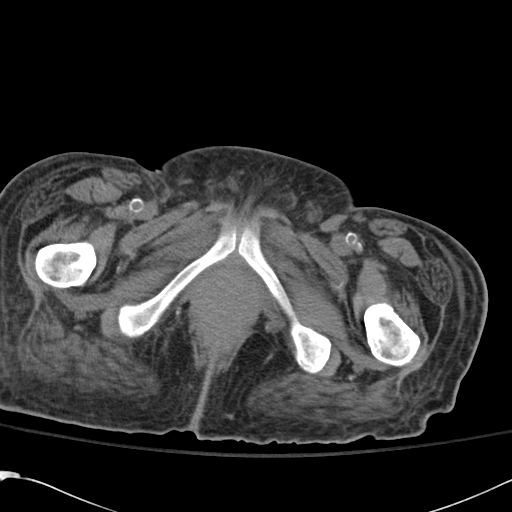
[im 4/88  bone]
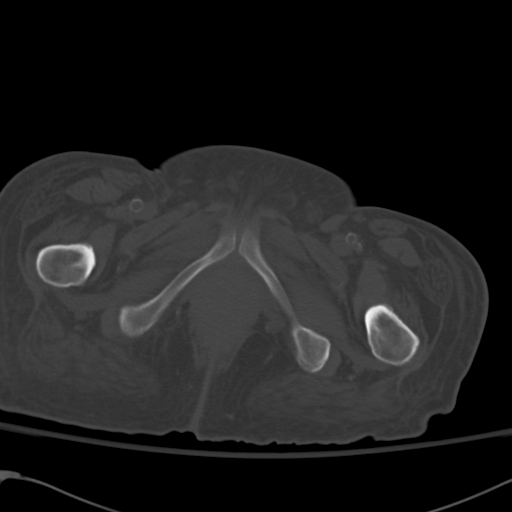
[im 11/88  soft-tissue]
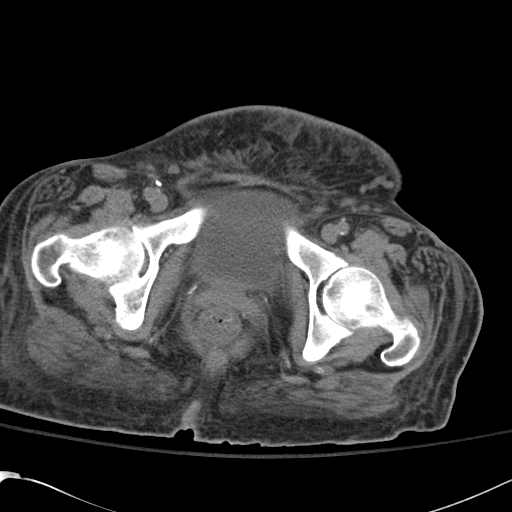
[im 19/88  soft-tissue]
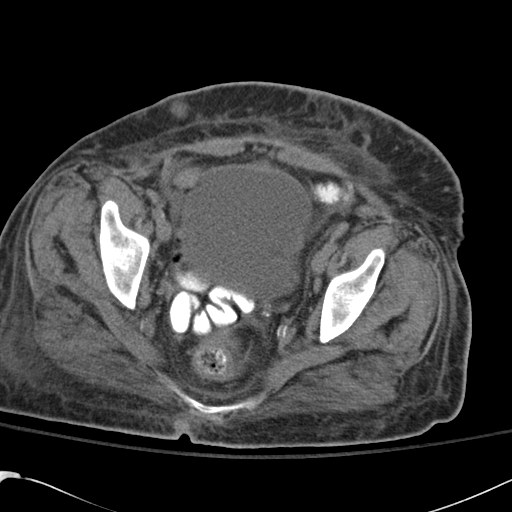
[im 26/88  soft-tissue]
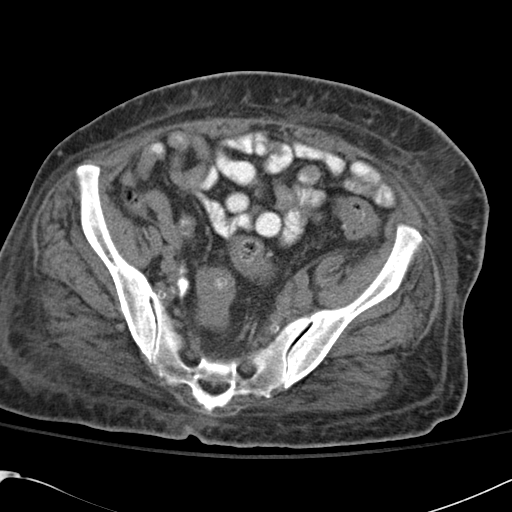
[im 33/88  soft-tissue]
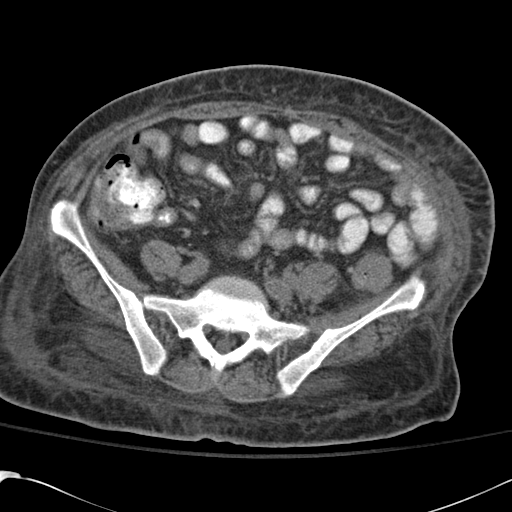
[im 40/88  soft-tissue]
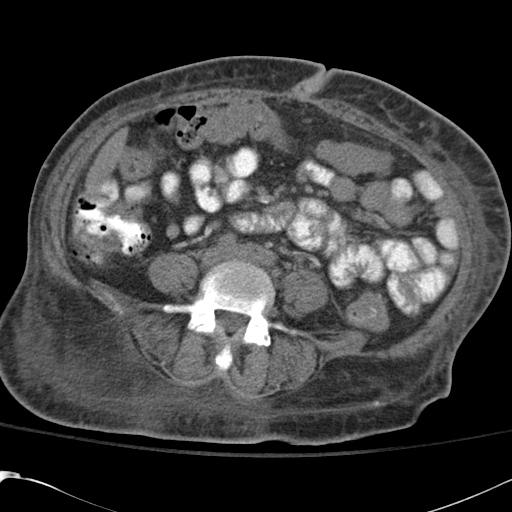
[im 48/88  soft-tissue]
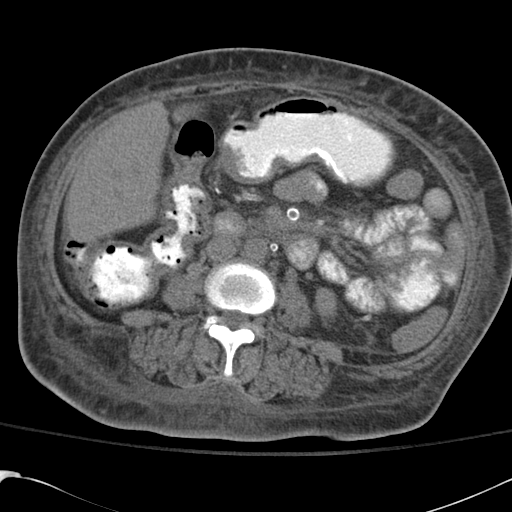
[im 55/88  soft-tissue]
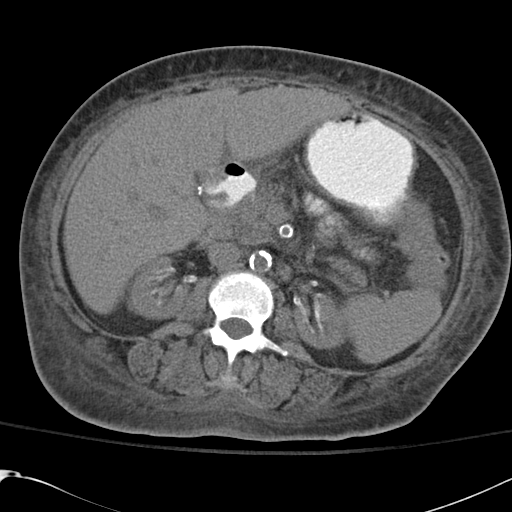
[im 62/88  soft-tissue]
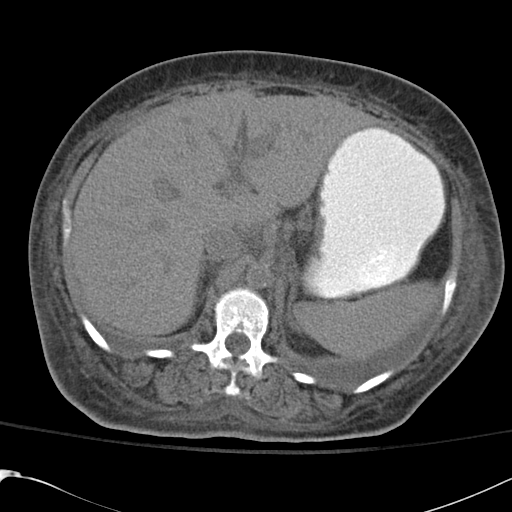
[im 62/88  bone]
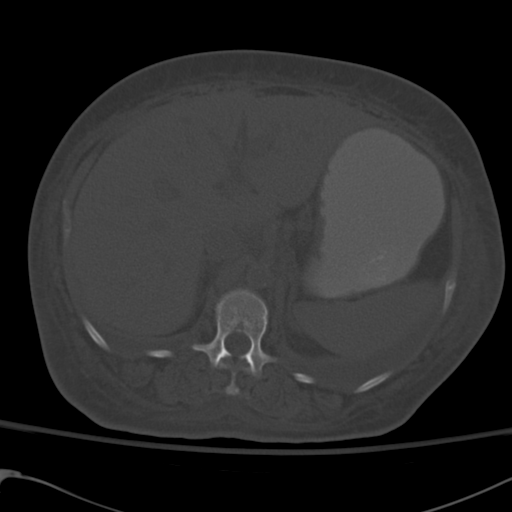
[im 69/88  soft-tissue]
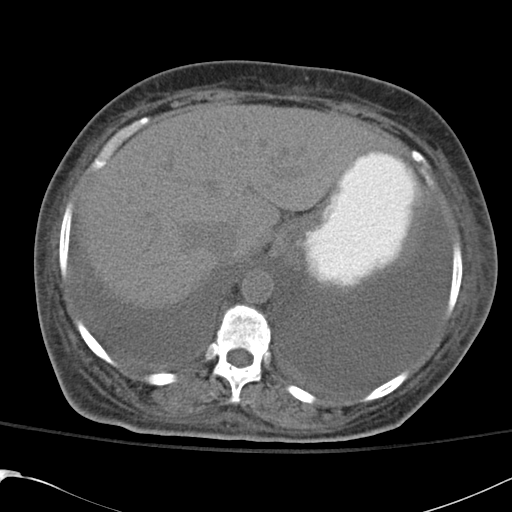
[im 77/88  soft-tissue]
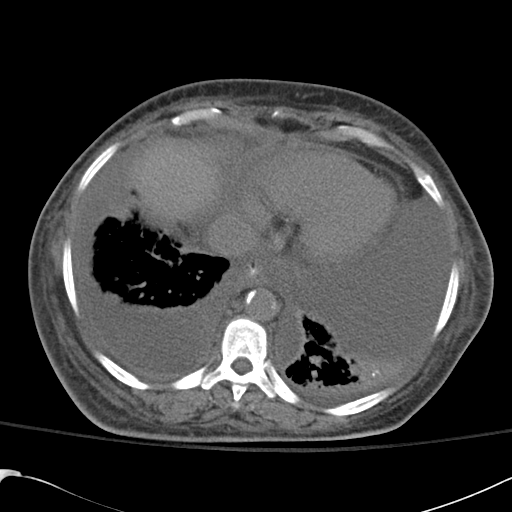
[im 84/88  soft-tissue]
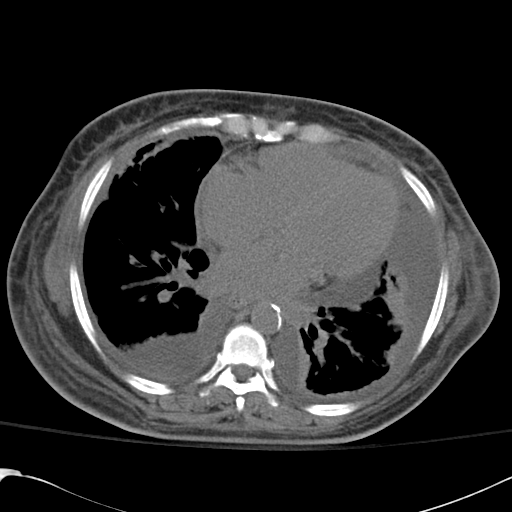

[Series 5: cor routine abd pel wo · coronal · 0.68mm/px · 3 of 135 slices shown]
[im 45/135  soft-tissue]
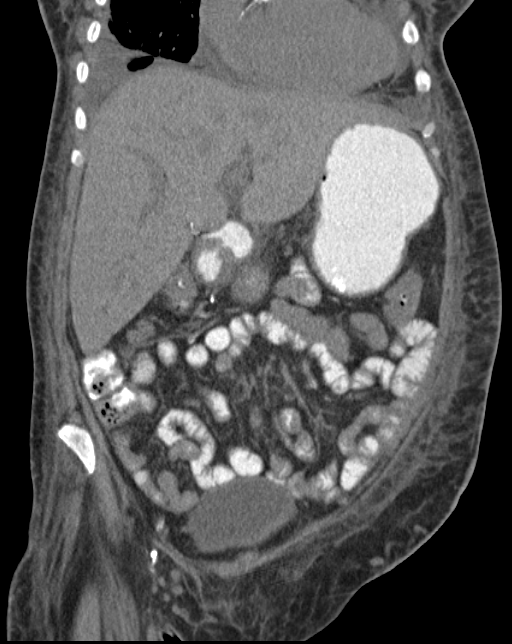
[im 60/135  soft-tissue]
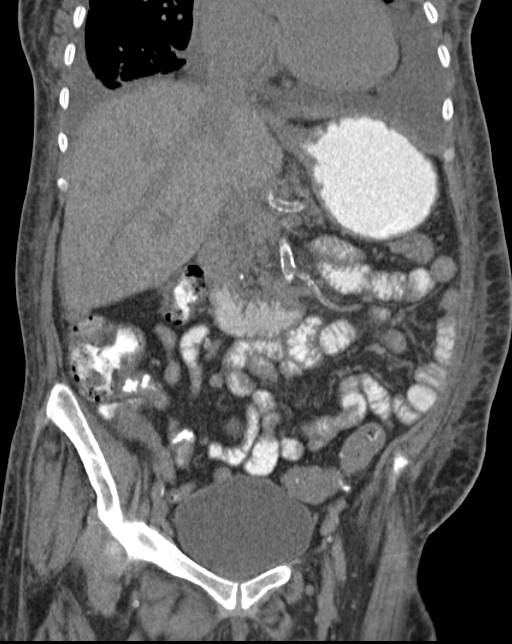
[im 75/135  soft-tissue]
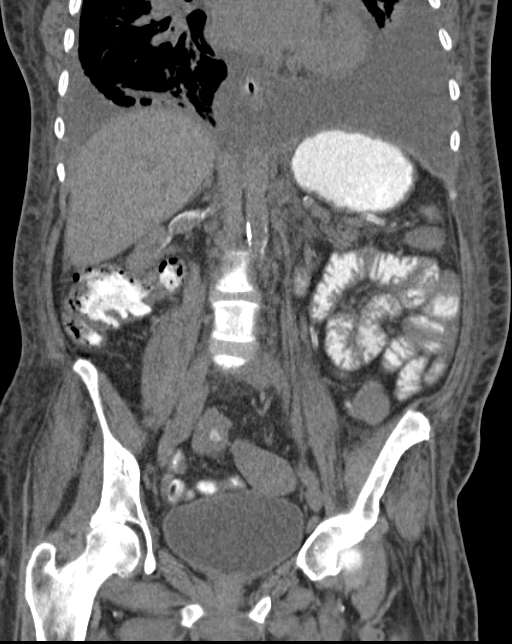

[15 of 46 positions shown; findings below may reference images not displayed]

FINDINGS: Lower chest: Bilateral loculated pleural effusions at the lung
bases, small and stable on the right and moderate and increased on
the left. Stable chronic mild pleural thickening on the right.
Compressive atelectasis at both lung bases, left greater than right.
Ground-glass attenuation in both lung bases may represent pulmonary
edema. Small pericardial effusion/ thickening, not appreciably
changed. Right coronary atherosclerosis.

Hepatobiliary: Normal liver with no liver mass. Cholecystectomy .
Bile ducts are stable and within expected post cholecystectomy
limits, with common bile duct diameter 6 mm. No radiopaque
choledocholithiasis. The

Pancreas: Mild haziness of the peripancreatic fat, unchanged. No
peripancreatic fluid collections. No pancreatic mass or duct
dilation.

Spleen: Normal size. No mass.

Adrenals/Urinary Tract: Normal adrenals. Symmetric prominent
bilateral renal atrophy. No hydronephrosis. No contour deforming
renal mass. Normal bladder.

Stomach/Bowel: Grossly normal stomach. Normal caliber small bowel
with no small bowel wall thickening. Appendectomy. Collapsed large
bowel. Mild sigmoid diverticulosis. No definite large bowel wall
thickening.

Vascular/Lymphatic: Atherosclerotic nonaneurysmal abdominal aorta.
No pathologically enlarged lymph nodes in the abdomen or pelvis.

Reproductive: Status post hysterectomy, with no abnormal findings at
the vaginal cuff. No adnexal mass.

Other: No pneumoperitoneum, ascites or focal fluid collection. Mild
to moderate anasarca, unchanged. Foci of subcutaneous soft tissue
attenuation in the right ventral abdominal wall are nonspecific,
probably due to subcutaneous injection of medications.

Musculoskeletal: No aggressive appearing focal osseous lesions.
Partial ankylosis of the disc space at T11-12, unchanged.
IMPRESSION: 1. Loculated pleural effusions and atelectasis at both lung bases,
stable and small on the right, and increased and small to moderate
on the left.
2. Stable small pericardial effusion/thickening, partially
visualized.
3. Nonspecific haziness of the peripancreatic fat, unchanged from
02/16/2016, probably due to noninflammatory edema given the
chronicity and presence of anasarca, although correlation with
lipase is recommended to exclude acute pancreatitis. No
peripancreatic fluid collections.
4. No evidence of bowel obstruction or acute bowel inflammation.
Mild sigmoid diverticulosis with no evidence of acute
diverticulitis.
5. Aortic atherosclerosis.  Coronary atherosclerosis.

## 2016-07-24 ENCOUNTER — Ambulatory Visit: Payer: Medicare Other

## 2016-07-29 ENCOUNTER — Encounter (INDEPENDENT_AMBULATORY_CARE_PROVIDER_SITE_OTHER): Payer: Self-pay | Admitting: Vascular Surgery

## 2016-07-29 ENCOUNTER — Other Ambulatory Visit (INDEPENDENT_AMBULATORY_CARE_PROVIDER_SITE_OTHER): Payer: Self-pay | Admitting: Vascular Surgery

## 2016-07-29 ENCOUNTER — Ambulatory Visit (INDEPENDENT_AMBULATORY_CARE_PROVIDER_SITE_OTHER): Payer: Medicare Other | Admitting: Vascular Surgery

## 2016-07-29 ENCOUNTER — Encounter (INDEPENDENT_AMBULATORY_CARE_PROVIDER_SITE_OTHER): Payer: Medicare Other

## 2016-07-29 VITALS — BP 147/60 | HR 72 | Resp 16 | Wt 160.0 lb

## 2016-07-29 DIAGNOSIS — M79669 Pain in unspecified lower leg: Secondary | ICD-10-CM

## 2016-07-29 DIAGNOSIS — M79662 Pain in left lower leg: Secondary | ICD-10-CM

## 2016-07-29 DIAGNOSIS — M79661 Pain in right lower leg: Secondary | ICD-10-CM | POA: Diagnosis not present

## 2016-07-29 DIAGNOSIS — I872 Venous insufficiency (chronic) (peripheral): Secondary | ICD-10-CM | POA: Insufficient documentation

## 2016-07-29 DIAGNOSIS — M7989 Other specified soft tissue disorders: Secondary | ICD-10-CM | POA: Diagnosis not present

## 2016-07-29 DIAGNOSIS — L03119 Cellulitis of unspecified part of limb: Secondary | ICD-10-CM | POA: Insufficient documentation

## 2016-07-29 DIAGNOSIS — I890 Lymphedema, not elsewhere classified: Secondary | ICD-10-CM | POA: Insufficient documentation

## 2016-07-29 IMAGING — US US EXTREM  UP VENOUS*R*
1 series · 13 of 24 positions shown · non-contrast
Comparison: None.

CLINICAL DATA: Right upper extremity edema.  Evaluate for DVT.



[Series 2: us extrem up venous*right* · 0.08mm/px · 13 of 54 slices shown]
[im 1/54]
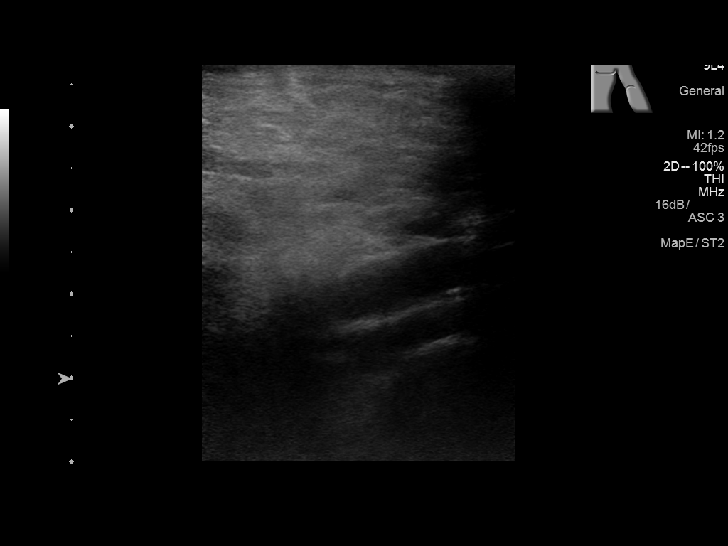
[im 5/54]
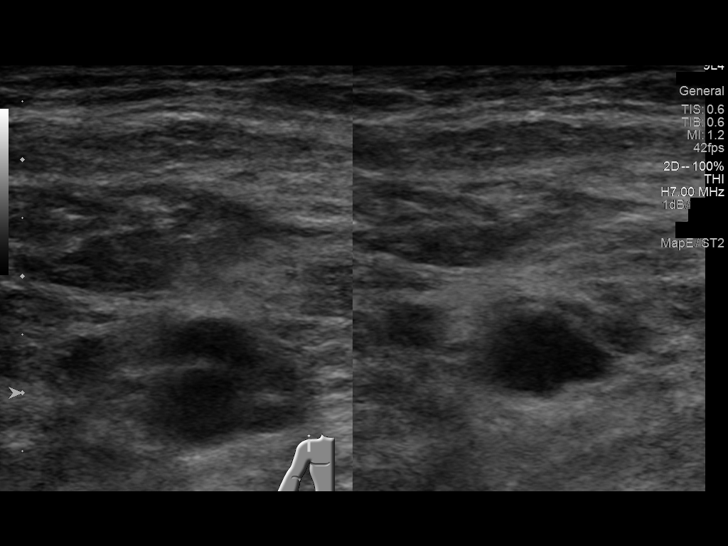
[im 10/54]
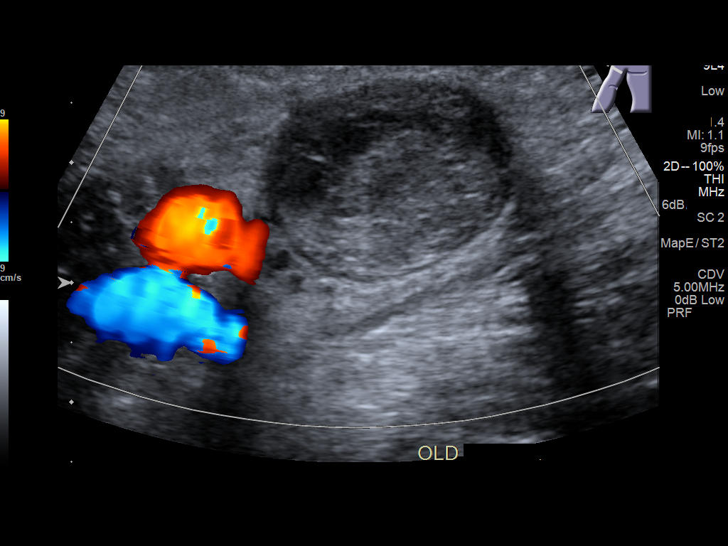
[im 14/54]
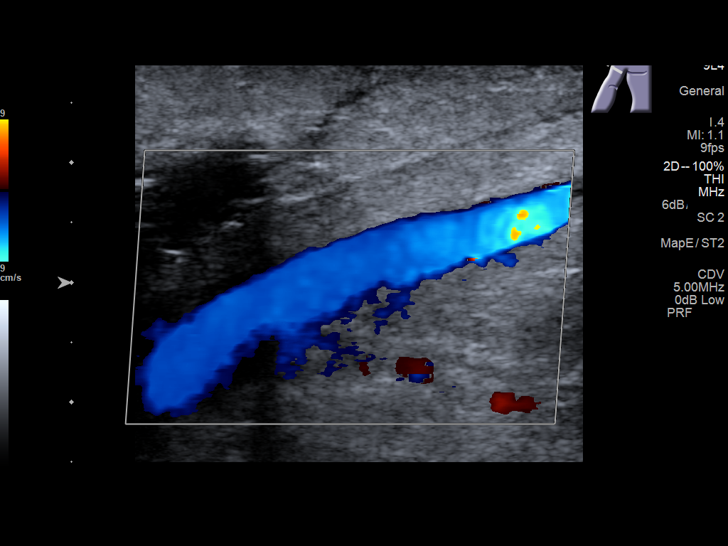
[im 19/54]
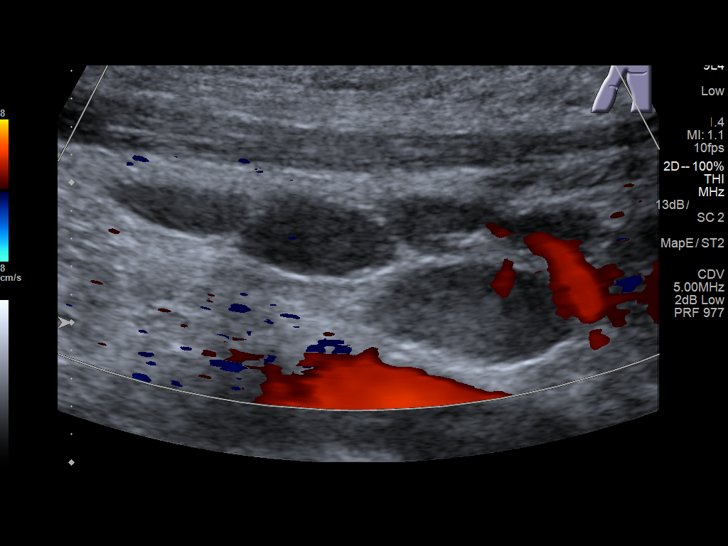
[im 24/54]
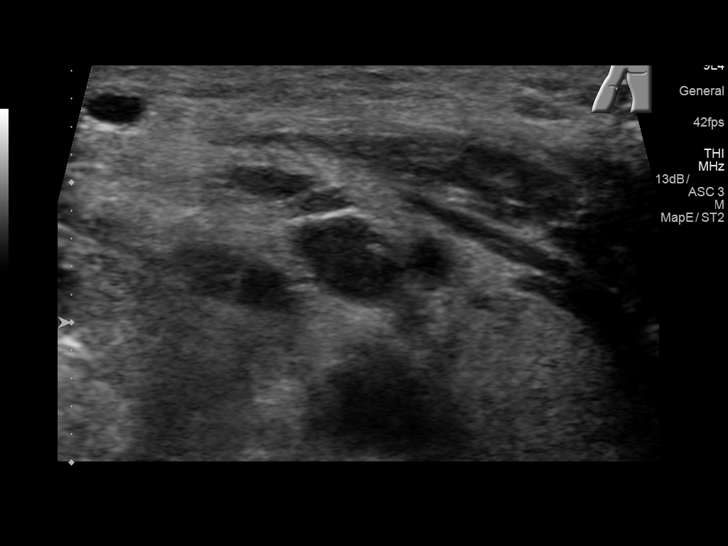
[im 28/54]
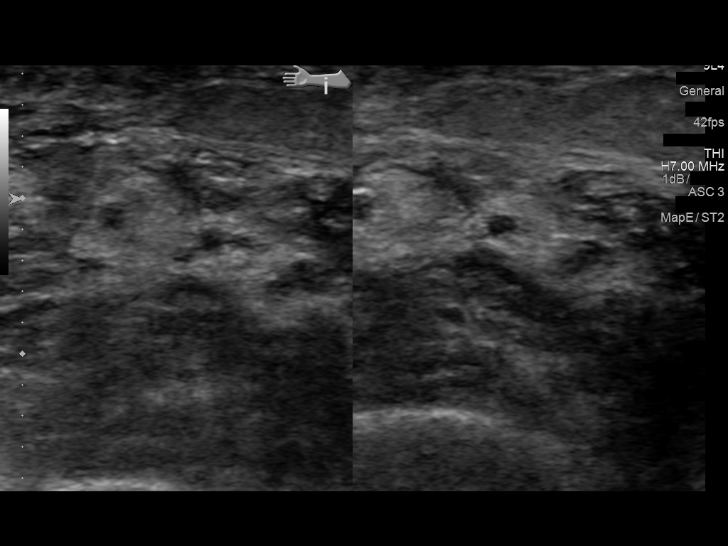
[im 30/54]
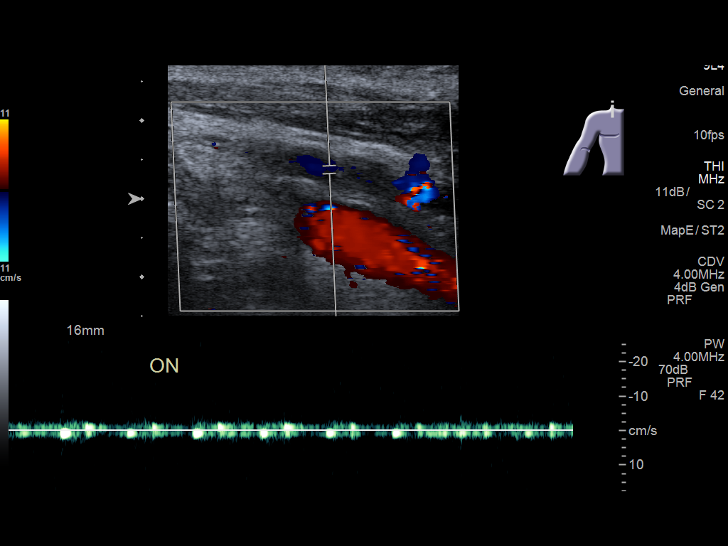
[im 35/54]
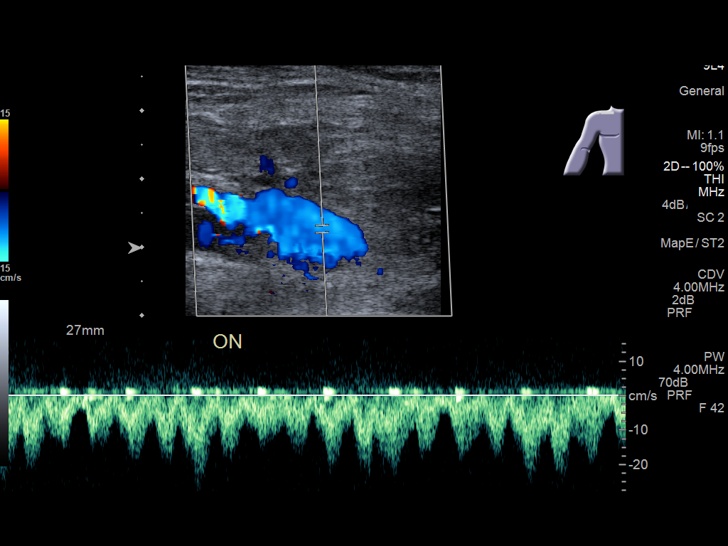
[im 40/54]
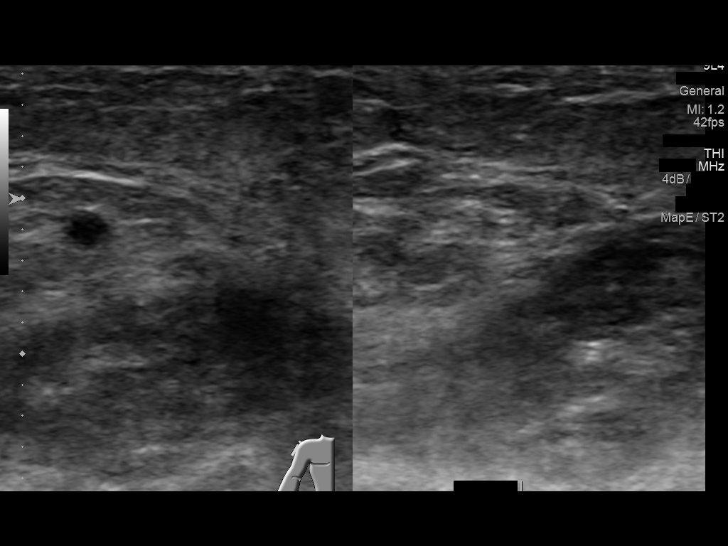
[im 44/54]
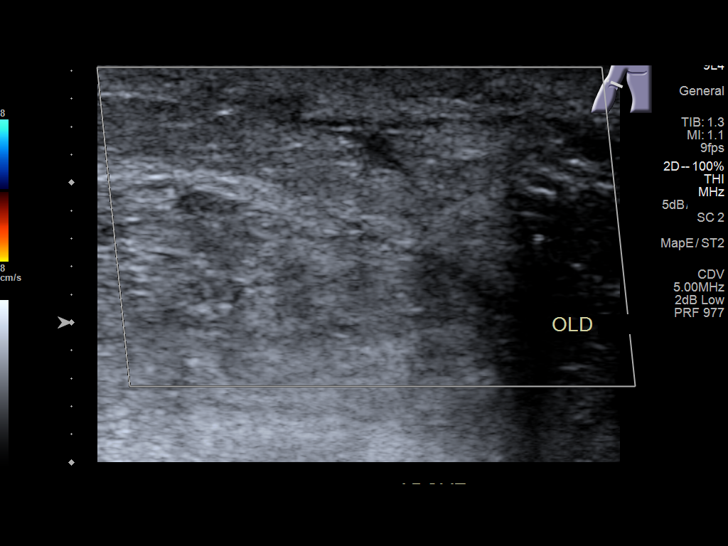
[im 49/54]
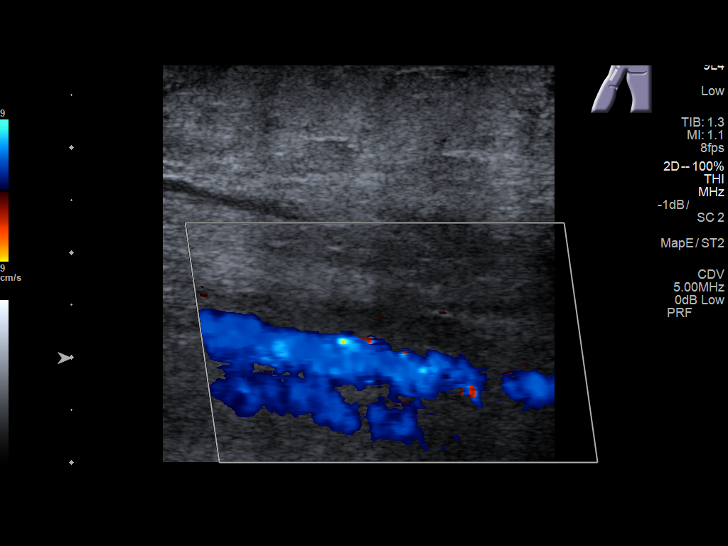
[im 54/54]
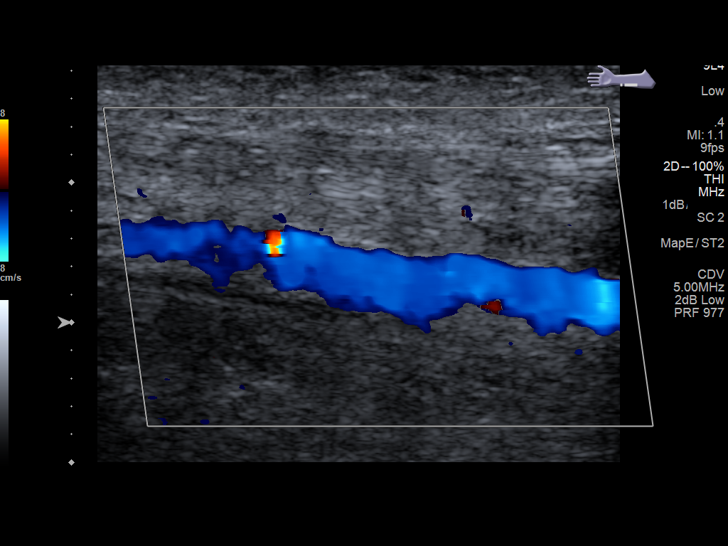

[13 of 24 positions shown; findings below may reference images not displayed]

FINDINGS: Contralateral Subclavian Vein: Respiratory phasicity is normal and
symmetric with the symptomatic side. No evidence of thrombus. Normal
compressibility.

Internal Jugular Vein: No evidence of thrombus. Normal
compressibility, respiratory phasicity and response to augmentation.

Subclavian Vein: No evidence of thrombus. Normal compressibility,
respiratory phasicity and response to augmentation.

Axillary Vein: No evidence of thrombus. Normal compressibility,
respiratory phasicity and response to augmentation.

Cephalic Vein: Not well visualized secondary to presence of prior
dialysis graft.

Basilic Vein: Not visualized.

Brachial Veins: No evidence of thrombus. Normal compressibility,
respiratory phasicity and response to augmentation.

Radial Veins: No evidence of thrombus. Normal compressibility,
respiratory phasicity and response to augmentation.

Ulnar Veins: No evidence of thrombus. Normal compressibility,
respiratory phasicity and response to augmentation.

Venous Reflux:  None visualized.

Other Findings:  None visualized.
IMPRESSION: No evidence of DVT within the right upper extremity.

## 2016-07-29 NOTE — Progress Notes (Signed)
Subjective:    Patient ID: [name], female    DOB: 1960/10/21, 55 y.o.   MRN: [id number] Chief Complaint  Patient presents with  . Follow-up   Patient last seen on 03/13/16 for ESRD access follow up. Presents today with three weeks of worsening bilateral lower extremity "swelling" and "redness". Patient denies fever, nausea or vomiting. The patient does not wear compression stockings or elevate her legs on a regular basis. The patient underwent a bilateral lower extremity venous duplex which was notable for RIGHT: No DVT or SVT. Reflux in GSV. LEFT: No DVT or SVT. Unable to visualize mid to distal SSV due to edema. Patient experiencing red raised bumps on neck, upper extremity and lower extremity. Skin intact on "bumps". Somewhat tender. Treated with cortisone cream with minimal relief.    Review of Systems  Constitutional: Negative.   HENT: Negative.   Eyes: Negative.   Respiratory: Negative.   Cardiovascular: Positive for leg swelling.  Gastrointestinal: Negative.   Endocrine: Negative.   Genitourinary: Negative.   Musculoskeletal: Negative.   Skin: Positive for color change and rash.  Allergic/Immunologic: Negative.   Neurological: Negative.   Hematological: Negative.   Psychiatric/Behavioral: Negative.       Objective:   Physical Exam  Constitutional: She is oriented to person, place, and time. She appears well-developed and well-nourished.  HENT:  Head: Normocephalic and atraumatic.  Eyes: Conjunctivae and EOM are normal. Pupils are equal, round, and reactive to light.  Neck: Normal range of motion.  Cardiovascular: Normal rate, regular rhythm, normal heart sounds and intact distal pulses.   Pulses:      Radial pulses are 2+ on the right side, and 2+ on the left side.       Dorsalis pedis pulses are 1+ on the right side, and 1+ on the left side.       Posterior tibial pulses are 1+ on the right side, and 1+ on the left side.  Pulmonary/Chest: Effort normal and breath  sounds normal.  Abdominal: Soft. Bowel sounds are normal.  Musculoskeletal: She exhibits edema (Moderate Bilateral Edema).  Neurological: She is alert and oriented to person, place, and time.  Skin:  Cellulitis noted on bilateral lower extremities.  Severe stasis dermatitis noted on bilateral lower extremities.  3cm raised red areas with skin intact, hard noted scattered around body.   Psychiatric: She has a normal mood and affect. Her behavior is normal. Judgment and thought content normal.   BP (!) 147/60   Pulse 72   Resp 16   Wt 160 lb (72.6 kg)   BMI 26.63 kg/m   Past Medical History:  Diagnosis Date  . Anemia   . Anxiety   . Arthritis   . Asthma   . chronic diastolic CHF 03/31/6268  . Collagen vascular disease ([place])   . COPD (chronic obstructive pulmonary disease) ([place])   . Coronary artery disease    a. cath 2013: stenting to RCA (report not available); b. cath 2014: LM nl, pLAD 40%, mLAD nl, ost LCx 40%, mid LCx nl, pRCA 30% @ site of prior stent, mRCA 50%  . Depression   . Diabetes mellitus without complication ([place])   . Diabetic neuropathy ([place])   . dialysis 2006  . ESRD (end stage renal disease) on dialysis ([place])    M-W-F  . GERD (gastroesophageal reflux disease)   . Headache   . Hx of pancreatitis 2015  . Hypertension   . Mitral regurgitation    a. echo  10/2013: EF 62%, noWMA, mildly dilated LA, mild to mod MR/TR, GR1DD  . Myocardial infarction   . Pneumonia   . Renal insufficiency    Social History   Social History  . Marital status: Divorced    Spouse name: N/A  . Number of children: N/A  . Years of education: N/A   Occupational History  . Not on file.   Social History Main Topics  . Smoking status: Former Smoker    Packs/day: 0.50    Types: Cigarettes    Quit date: 02/13/2015  . Smokeless tobacco: Never Used  . Alcohol use No  . Drug use: No  . Sexual activity: Not on file   Other Topics Concern  . Not on file   Social History Narrative    . No narrative on file   Past Surgical History:  Procedure Laterality Date  . ABDOMINAL HYSTERECTOMY    . APPENDECTOMY    . CARDIAC CATHETERIZATION Left 07/26/2015   Procedure: Left Heart Cath and Coronary Angiography;  Surgeon: Dionisio David, MD;  Location: [facility];  Service: Cardiovascular;  Laterality: Left;  . CHOLECYSTECTOMY    . DIALYSIS FISTULA CREATION Left    upper arm  . ESOPHAGOGASTRODUODENOSCOPY N/A 03/08/2015   Procedure: ESOPHAGOGASTRODUODENOSCOPY (EGD);  Surgeon: Manya Silvas, MD;  Location: [facility] ENDOSCOPY;  Service: Endoscopy;  Laterality: N/A;  . ESOPHAGOGASTRODUODENOSCOPY (EGD) WITH PROPOFOL N/A 03/18/2016   Procedure: ESOPHAGOGASTRODUODENOSCOPY (EGD) WITH PROPOFOL;  Surgeon: [name], MD;  Location: [facility] ENDOSCOPY;  Service: Endoscopy;  Laterality: N/A;  . EYE SURGERY    . FECAL TRANSPLANT N/A 08/23/2015   Procedure: FECAL TRANSPLANT;  Surgeon: Manya Silvas, MD;  Location: [facility] ENDOSCOPY;  Service: Endoscopy;  Laterality: N/A;  . PERIPHERAL VASCULAR CATHETERIZATION N/A 12/20/2015   Procedure: Thrombectomy of dialysis access versus permcath placement;  Surgeon: Algernon Huxley, MD;  Location: [place] [facility];  Service: Cardiovascular;  Laterality: N/A;  . PERIPHERAL VASCULAR CATHETERIZATION N/A 12/20/2015   Procedure: A/V Shunt Intervention;  Surgeon: Algernon Huxley, MD;  Location: [place];  Service: Cardiovascular;  Laterality: N/A;  . PERIPHERAL VASCULAR CATHETERIZATION N/A 12/20/2015   Procedure: A/V Shuntogram/Fistulagram;  Surgeon: Algernon Huxley, MD;  Location: [place] [facility];  Service: Cardiovascular;  Laterality: N/A;  . PERIPHERAL VASCULAR CATHETERIZATION N/A 01/02/2016   Procedure: A/V Shuntogram/Fistulagram;  Surgeon: Algernon Huxley, MD;  Location: [facility];  Service: Cardiovascular;  Laterality: N/A;  . PERIPHERAL VASCULAR CATHETERIZATION N/A 01/02/2016   Procedure: A/V Shunt Intervention;  Surgeon: Algernon Huxley, MD;   Location: [place] [facility];  Service: Cardiovascular;  Laterality: N/A;   Family History  Problem Relation Age of Onset  . Kidney disease Mother   . Diabetes Mother    Allergies  Allergen Reactions  . Compazine [Prochlorperazine Edisylate] Anaphylaxis and Nausea And Vomiting    23 Jul - patient relates that she takes promethazine frequently with no problems.  . Ace Inhibitors Swelling  . Ativan [Lorazepam] Other (See Comments)    Reaction:  Hallucinations and headaches  . Codeine Nausea And Vomiting  . Dilaudid [Hydromorphone Hcl] Other (See Comments)    Delirium  . Gabapentin Other (See Comments)    Reaction:  Unknown   . Losartan Other (See Comments)    Reaction:  Unknown   . Ondansetron Other (See Comments)    Reaction:  Unknown   . Prochlorperazine Other (See Comments)    Reaction:  Unknown   .  Reglan [Metoclopramide]     Per patient her Dr. Evelina Bucy her off it   . Scopolamine Other (See Comments)    Reaction:  Unknown   . Zofran [Ondansetron Hcl] Other (See Comments)    Reaction:  hallucinations   . Oxycodone Anxiety  . Tape Rash      Assessment & Plan:  Patient last seen on 03/13/16 for ESRD access follow up. Presents today with three weeks of worsening bilateral lower extremity "swelling" and "redness". Patient denies fever, nausea or vomiting. The patient does not wear compression stockings or elevate her legs on a regular basis. The patient underwent a bilateral lower extremity venous duplex which was notable for RIGHT: No DVT or SVT. Reflux in GSV. LEFT: No DVT or SVT. Unable to visualize mid to distal SSV due to edema. Patient experiencing red raised bumps on neck, upper extremity and lower extremity. Skin intact on "bumps". Somewhat tender. Treated with cortisone cream with minimal relief.   1. Chronic venous insufficiency - New Will wrap legs in bilateral unna wraps to gain control of edema x 4 weeks then transition to compression stockings. Patient to elevate daily  at least heart level or higher. Patient expresses her understanding. Possible laser ablation in future.   2. Lymphedema - New Will wrap legs in bilateral unna wraps to gain control of edema x 4 weeks then transition to compression stockings. Patient to elevate daily at least heart level or higher. Patient expresses her understanding. Possible lymphedema pump in future.   3. Cellulitis of lower extremity, unspecified laterality - New Kelfex 500mg  PO q8 hours x 10 days Zinc Oxide Unna Wraps x four weeks. Elevation.  4. Rash - New Patient encouraged to follow up with PCP who has been treating rash or dermatologist for further work up of rash. Not vascular in origin.   Current Outpatient Prescriptions on File Prior to Visit  Medication Sig Dispense Refill  . acidophilus (RISAQUAD) CAPS capsule Take 1 capsule by mouth daily.    Marland Kitchen albuterol (PROVENTIL HFA;VENTOLIN HFA) 108 (90 Base) MCG/ACT inhaler Inhale 2 puffs into the lungs every 4 (four) hours as needed for shortness of breath.     Marland Kitchen albuterol (PROVENTIL) (2.5 MG/3ML) 0.083% nebulizer solution Take 2.5 mg by nebulization every 4 (four) hours as needed for wheezing or shortness of breath.    Marland Kitchen amLODipine (NORVASC) 10 MG tablet Take 10 mg by mouth daily.    Marland Kitchen atorvastatin (LIPITOR) 20 MG tablet Take 20 mg by mouth at bedtime.     . cetirizine (ZYRTEC) 10 MG tablet Take 10 mg by mouth daily.     . diclofenac sodium (VOLTAREN) 1 % GEL Apply 2 g topically 4 (four) times daily.    Marland Kitchen FLUoxetine (PROZAC) 20 MG capsule Take 1 capsule (20 mg total) by mouth daily. (Patient taking differently: Take 20 mg by mouth at bedtime. ) 30 capsule 0  . glucose 4 GM chewable tablet Chew 3-4 tablets by mouth as needed for low blood sugar.    . hydrALAZINE (APRESOLINE) 25 MG tablet Take 25 mg by mouth 3 (three) times daily.    Marland Kitchen HYDROcodone-acetaminophen (NORCO/VICODIN) 5-325 MG tablet Take 1 tablet by mouth every 6 (six) hours as needed for moderate pain or severe  pain. 15 tablet 0  . hydrOXYzine (ATARAX/VISTARIL) 25 MG tablet Take 25 mg by mouth every 6 (six) hours as needed for itching.    . insulin aspart (NOVOLOG) 100 UNIT/ML injection Inject 0-6 Units into the skin 3 (  three) times daily before meals. Per sliding scale    . insulin glargine (LANTUS) 100 UNIT/ML injection Inject 8 Units into the skin at bedtime.    . lamoTRIgine (LAMICTAL) 25 MG tablet Take 25 mg by mouth at bedtime.    . lipase/protease/amylase (CREON) 12000 UNITS CPEP capsule Take 36,000 Units by mouth 2 (two) times daily.     Marland Kitchen lubiprostone (AMITIZA) 24 MCG capsule Take 24 mcg by mouth daily.    . meclizine (ANTIVERT) 12.5 MG tablet Take 1 tablet (12.5 mg total) by mouth 3 (three) times daily as needed for dizziness. (Patient taking differently: Take 12.5 mg by mouth every 8 (eight) hours as needed for dizziness. ) 30 tablet 0  . multivitamin (RENA-VIT) TABS tablet Take 1 tablet by mouth every evening.     Marland Kitchen OLANZapine (ZYPREXA) 5 MG tablet Take 0.5 tablets (2.5 mg total) by mouth at bedtime. (Patient taking differently: Take 5 mg by mouth at bedtime. ) 30 tablet 0  . omega-3 acid ethyl esters (LOVAZA) 1 g capsule Take 1 g by mouth at bedtime.    . pantoprazole (PROTONIX) 40 MG tablet Take 1 tablet (40 mg total) by mouth 2 (two) times daily before a meal. 60 tablet 2  . pregabalin (LYRICA) 25 MG capsule Take 25 mg by mouth daily.    . promethazine (PHENERGAN) 25 MG suppository Place 1 suppository (25 mg total) rectally every 6 (six) hours as needed for nausea. 12 suppository 0  . ranitidine (ZANTAC) 150 MG tablet Take 150 mg by mouth daily. In the afternoon    . rifaximin (XIFAXAN) 550 MG TABS tablet Take 550 mg by mouth 2 (two) times daily.    . sevelamer carbonate (RENVELA) 800 MG tablet Take 2,400 mg by mouth 3 (three) times daily with meals.     . vitamin B-12 (CYANOCOBALAMIN) 500 MCG tablet Take 1,000 mcg by mouth daily.    . promethazine (PHENERGAN) 25 MG suppository Place 1  suppository (25 mg total) rectally every 6 (six) hours as needed for nausea. Stop taking the oral promethazine (phenergan).  Use these suppositories instead. (Patient not taking: Reported on 07/29/2016) 12 suppository 0   No current facility-administered medications on file prior to visit.     There are no Patient Instructions on file for this visit. No Follow-up on file.   Nafisa Olds A Izayiah Tibbitts, PA-C

## 2016-07-31 ENCOUNTER — Ambulatory Visit
Admission: RE | Admit: 2016-07-31 | Discharge: 2016-07-31 | Disposition: A | Discharge status: Home / Self Care | Payer: Medicare Other | Source: Ambulatory Visit | Attending: Urology | Admitting: Urology

## 2016-07-31 DIAGNOSIS — N186 End stage renal disease: Secondary | ICD-10-CM | POA: Diagnosis not present

## 2016-07-31 DIAGNOSIS — R590 Localized enlarged lymph nodes: Secondary | ICD-10-CM | POA: Diagnosis not present

## 2016-07-31 DIAGNOSIS — I2510 Atherosclerotic heart disease of native coronary artery without angina pectoris: Secondary | ICD-10-CM | POA: Diagnosis not present

## 2016-07-31 DIAGNOSIS — I700 Atherosclerosis of aorta: Secondary | ICD-10-CM | POA: Insufficient documentation

## 2016-07-31 DIAGNOSIS — Z9049 Acquired absence of other specified parts of digestive tract: Secondary | ICD-10-CM | POA: Diagnosis not present

## 2016-07-31 DIAGNOSIS — R160 Hepatomegaly, not elsewhere classified: Secondary | ICD-10-CM | POA: Insufficient documentation

## 2016-07-31 DIAGNOSIS — J90 Pleural effusion, not elsewhere classified: Secondary | ICD-10-CM | POA: Insufficient documentation

## 2016-07-31 DIAGNOSIS — N2889 Other specified disorders of kidney and ureter: Secondary | ICD-10-CM | POA: Diagnosis not present

## 2016-07-31 MED ORDER — IOPAMIDOL (ISOVUE-370) INJECTION 76%
100.0000 mL | Freq: Once | INTRAVENOUS | Status: AC | PRN
  Administered 2016-07-31: 100 mL via INTRAVENOUS

## 2016-08-05 ENCOUNTER — Encounter (INDEPENDENT_AMBULATORY_CARE_PROVIDER_SITE_OTHER): Payer: Self-pay | Admitting: Vascular Surgery

## 2016-08-05 ENCOUNTER — Encounter (INDEPENDENT_AMBULATORY_CARE_PROVIDER_SITE_OTHER): Payer: Medicare Other

## 2016-08-05 ENCOUNTER — Ambulatory Visit (INDEPENDENT_AMBULATORY_CARE_PROVIDER_SITE_OTHER): Payer: Medicare Other | Admitting: Vascular Surgery

## 2016-08-05 VITALS — BP 125/56 | HR 72 | Resp 16 | Ht 65.0 in | Wt 161.0 lb

## 2016-08-05 DIAGNOSIS — I872 Venous insufficiency (chronic) (peripheral): Secondary | ICD-10-CM | POA: Diagnosis not present

## 2016-08-05 NOTE — Progress Notes (Signed)
History of Present Illness  There is no documented history at this time  Assessments & Plan   There are no diagnoses linked to this encounter.    Additional instructions  Subjective:  Patient presents with venous ulcer of the Bilateral lower extremity.    Procedure:  3 layer unna wrap was placed Bilateral lower extremity.   Plan:   Follow up in one week.  

## 2016-08-05 NOTE — Progress Notes (Signed)
History of Present Illness  There is no documented history at this time  Assessments & Plan   There are no diagnoses linked to this encounter.    Additional instructions  Subjective:  Patient presents with venous ulcer of the bilateral lower extremity.    Procedure:  3 layer unna wrap was placed bilaterally lower extremity.   Plan:   Follow up in one week.

## 2016-08-07 ENCOUNTER — Ambulatory Visit (INDEPENDENT_AMBULATORY_CARE_PROVIDER_SITE_OTHER): Payer: Medicare Other | Admitting: Urology

## 2016-08-07 ENCOUNTER — Encounter: Payer: Self-pay | Admitting: Urology

## 2016-08-07 VITALS — BP 109/49 | HR 74 | Ht 65.0 in | Wt 151.2 lb

## 2016-08-07 DIAGNOSIS — N2889 Other specified disorders of kidney and ureter: Secondary | ICD-10-CM | POA: Diagnosis not present

## 2016-08-07 NOTE — Progress Notes (Signed)
08/07/2016 6:56 AM   [name] [birth year] [id number]  Referring provider: Ellamae Sia, MD [address] [place], [place] [address]  Chief Complaint  Patient presents with  . New Patient (Initial Visit)    CT results    HPI: 55 y.o. year old renal disease on hemodialysis, CAD, DM with neuropathy, COPD chronic diastolic heart failure initially seen during inpatient hospital admission on 10/2015 for incidental right renal masses x 2. She returns today with follow-repeat CT scan.   These multiple enhancing tumors measuring 1.6 cm and 11 mm both on the right.  Bilateral kidneys were noted to be atrophic with cortical thinning.  Review of previous CT scan from 02/22/2015 to show presence of these tumors although with a more subtle appearance but relatively unchanged in size.  Today, CT abd with and without contrast performed on 08/01/16 shows an enhancing lateral upper pole right renal lesion measuring 11 mm, 1.3 as well as a 6 mm medial upper pole lesion which are unchanged in size since previous imaging.  She does have some borderline para-aortic lymph nodes which are unchanged.  She denies any flank pain or gross hematuria.  Due to being on hemodialysis, she voids only a small amount every other day.  No UTIs.  PMH: Past Medical History:  Diagnosis Date  . Anemia   . Anxiety   . Arthritis   . Asthma   . chronic diastolic CHF 5/88/5027  . COPD (chronic obstructive pulmonary disease) ([place])   . Coronary artery disease    a. cath 2013: stenting to RCA (report not available); b. cath 2014: LM nl, pLAD 40%, mLAD nl, ost LCx 40%, mid LCx nl, pRCA 30% @ site of prior stent, mRCA 50%  . Depression   . Diabetes mellitus without complication ([place])   . Diabetic neuropathy ([place])   . dialysis 2006  . ESRD (end stage renal disease) on dialysis ([place])    M-W-F  . GERD (gastroesophageal reflux disease)   . Headache   . Hx of pancreatitis 2015  . Hypertension   . Mitral  regurgitation    a. echo 10/2013: EF 62%, noWMA, mildly dilated LA, mild to mod MR/TR, GR1DD  . Myocardial infarction   . Pneumonia   . Renal insufficiency    Pt is on dialysis on M,W + F.    Surgical History: Past Surgical History:  Procedure Laterality Date  . ABDOMINAL HYSTERECTOMY    . APPENDECTOMY    . CARDIAC CATHETERIZATION Left 07/26/2015   Procedure: Left Heart Cath and Coronary Angiography;  Surgeon: Dionisio David, MD;  Location: [place];  Service: Cardiovascular;  Laterality: Left;  . CHOLECYSTECTOMY    . DIALYSIS FISTULA CREATION Left    upper arm  . ESOPHAGOGASTRODUODENOSCOPY N/A 03/08/2015   Procedure: ESOPHAGOGASTRODUODENOSCOPY (EGD);  Surgeon: Manya Silvas, MD;  Location: [facility] ENDOSCOPY;  Service: Endoscopy;  Laterality: N/A;  . ESOPHAGOGASTRODUODENOSCOPY (EGD) WITH PROPOFOL N/A 03/18/2016   Procedure: ESOPHAGOGASTRODUODENOSCOPY (EGD) WITH PROPOFOL;  Surgeon: [name], MD;  Location: [facility] ENDOSCOPY;  Service: Endoscopy;  Laterality: N/A;  . EYE SURGERY    . FECAL TRANSPLANT N/A 08/23/2015   Procedure: FECAL TRANSPLANT;  Surgeon: Manya Silvas, MD;  Location: [facility] ENDOSCOPY;  Service: Endoscopy;  Laterality: N/A;  . PERIPHERAL VASCULAR CATHETERIZATION N/A 12/20/2015   Procedure: Thrombectomy of dialysis access versus permcath placement;  Surgeon: Algernon Huxley, MD;  Location: [place] [facility];  Service: Cardiovascular;  Laterality: N/A;  .  PERIPHERAL VASCULAR CATHETERIZATION N/A 12/20/2015   Procedure: A/V Shunt Intervention;  Surgeon: Algernon Huxley, MD;  Location: [place] [facility];  Service: Cardiovascular;  Laterality: N/A;  . PERIPHERAL VASCULAR CATHETERIZATION N/A 12/20/2015   Procedure: A/V Shuntogram/Fistulagram;  Surgeon: Algernon Huxley, MD;  Location: [place] [facility];  Service: Cardiovascular;  Laterality: N/A;  . PERIPHERAL VASCULAR CATHETERIZATION N/A 01/02/2016   Procedure: A/V Shuntogram/Fistulagram;  Surgeon: Algernon Huxley, MD;  Location:  [place] [facility];  Service: Cardiovascular;  Laterality: N/A;  . PERIPHERAL VASCULAR CATHETERIZATION N/A 01/02/2016   Procedure: A/V Shunt Intervention;  Surgeon: Algernon Huxley, MD;  Location: [place] [facility];  Service: Cardiovascular;  Laterality: N/A;    Home Medications:    Medication List       Accurate as of 08/07/16 11:59 PM. Always use your most recent med list.          acidophilus Caps capsule Take 1 capsule by mouth daily.   albuterol (2.5 MG/3ML) 0.083% nebulizer solution Commonly known as:  PROVENTIL Take 2.5 mg by nebulization every 4 (four) hours as needed for wheezing or shortness of breath.   albuterol 108 (90 Base) MCG/ACT inhaler Commonly known as:  PROVENTIL HFA;VENTOLIN HFA Inhale 2 puffs into the lungs every 4 (four) hours as needed for shortness of breath.   amLODipine 10 MG tablet Commonly known as:  NORVASC Take 10 mg by mouth daily.   atorvastatin 20 MG tablet Commonly known as:  LIPITOR Take 20 mg by mouth at bedtime.   cetirizine 10 MG tablet Commonly known as:  ZYRTEC Take 10 mg by mouth daily.   diclofenac sodium 1 % Gel Commonly known as:  VOLTAREN Apply 2 g topically 4 (four) times daily.   FLUoxetine 20 MG capsule Commonly known as:  PROZAC Take 1 capsule (20 mg total) by mouth daily.   glucose 4 GM chewable tablet Chew 3-4 tablets by mouth as needed for low blood sugar.   hydrALAZINE 25 MG tablet Commonly known as:  APRESOLINE Take 25 mg by mouth 3 (three) times daily.   HYDROcodone-acetaminophen 5-325 MG tablet Commonly known as:  NORCO/VICODIN Take 1 tablet by mouth every 6 (six) hours as needed for moderate pain or severe pain.   hydrOXYzine 25 MG tablet Commonly known as:  ATARAX/VISTARIL Take 25 mg by mouth every 6 (six) hours as needed for itching.   insulin aspart 100 UNIT/ML injection Commonly known as:  novoLOG Inject 0-6 Units into the skin 3 (three) times daily before meals. Per sliding scale   insulin  glargine 100 UNIT/ML injection Commonly known as:  LANTUS Inject 8 Units into the skin at bedtime.   lamoTRIgine 25 MG tablet Commonly known as:  LAMICTAL Take 25 mg by mouth at bedtime.   lipase/protease/amylase 12000 units Cpep capsule Commonly known as:  CREON Take 36,000 Units by mouth 2 (two) times daily.   lubiprostone 24 MCG capsule Commonly known as:  AMITIZA Take 24 mcg by mouth daily.   meclizine 12.5 MG tablet Commonly known as:  ANTIVERT Take 1 tablet (12.5 mg total) by mouth 3 (three) times daily as needed for dizziness.   multivitamin Tabs tablet Take 1 tablet by mouth every evening.   OLANZapine 5 MG tablet Commonly known as:  ZYPREXA Take 0.5 tablets (2.5 mg total) by mouth at bedtime.   omega-3 acid ethyl esters 1 g capsule Commonly known as:  LOVAZA Take 1 g by mouth at bedtime.   pantoprazole 40  MG tablet Commonly known as:  PROTONIX Take 1 tablet (40 mg total) by mouth 2 (two) times daily before a meal.   pregabalin 25 MG capsule Commonly known as:  LYRICA Take 25 mg by mouth daily.   promethazine 25 MG suppository Commonly known as:  PHENERGAN Place 1 suppository (25 mg total) rectally every 6 (six) hours as needed for nausea.   promethazine 25 MG suppository Commonly known as:  PHENERGAN Place 1 suppository (25 mg total) rectally every 6 (six) hours as needed for nausea. Stop taking the oral promethazine (phenergan).  Use these suppositories instead.   ranitidine 150 MG tablet Commonly known as:  ZANTAC Take 150 mg by mouth daily. In the afternoon   rifaximin 550 MG Tabs tablet Commonly known as:  XIFAXAN Take 550 mg by mouth 2 (two) times daily.   sevelamer carbonate 800 MG tablet Commonly known as:  RENVELA Take 2,400 mg by mouth 3 (three) times daily with meals.   vitamin B-12 500 MCG tablet Commonly known as:  CYANOCOBALAMIN Take 1,000 mcg by mouth daily.       Allergies:  Allergies  Allergen Reactions  . Compazine  [Prochlorperazine Edisylate] Anaphylaxis and Nausea And Vomiting    23 Jul - patient relates that she takes promethazine frequently with no problems.  . Ace Inhibitors Swelling  . Ativan [Lorazepam] Other (See Comments)    Reaction:  Hallucinations and headaches  . Codeine Nausea And Vomiting  . Dilaudid [Hydromorphone Hcl] Other (See Comments)    Delirium  . Gabapentin Other (See Comments)    Reaction:  Unknown   . Losartan Other (See Comments)    Reaction:  Unknown   . Ondansetron Other (See Comments)    Reaction:  Unknown   . Prochlorperazine Other (See Comments)    Reaction:  Unknown   . Reglan [Metoclopramide]     Per patient her Dr. Evelina Bucy her off it   . Scopolamine Other (See Comments)    Reaction:  Unknown   . Zofran [Ondansetron Hcl] Other (See Comments)    Reaction:  hallucinations   . Oxycodone Anxiety  . Tape Rash    Family History: Family History  Problem Relation Age of Onset  . Kidney disease Mother   . Diabetes Mother     Social History:  reports that she quit smoking about 17 months ago. Her smoking use included Cigarettes. She smoked 0.50 packs per day. She has never used smokeless tobacco. She reports that she does not drink alcohol or use drugs.  ROS: UROLOGY Frequent Urination?: No Hard to postpone urination?: No Burning/pain with urination?: No Get up at night to urinate?: No Leakage of urine?: No Urine stream starts and stops?: No Trouble starting stream?: Yes Do you have to strain to urinate?: Yes Blood in urine?: No Urinary tract infection?: No Sexually transmitted disease?: No Injury to kidneys or bladder?: No Painful intercourse?: No Weak stream?: No Currently pregnant?: No Vaginal bleeding?: No Last menstrual period?: n  Gastrointestinal Nausea?: Yes Vomiting?: Yes Indigestion/heartburn?: Yes Diarrhea?: Yes Constipation?: Yes  Constitutional Fever: No Night sweats?: Yes Weight loss?: Yes Fatigue?: Yes  Skin Skin  rash/lesions?: Yes Itching?: Yes  Eyes Blurred vision?: Yes Double vision?: Yes  Ears/Nose/Throat Sore throat?: No Sinus problems?: No  Hematologic/Lymphatic Swollen glands?: No Easy bruising?: Yes  Cardiovascular Leg swelling?: Yes Chest pain?: No  Respiratory Cough?: No Shortness of breath?: Yes  Endocrine Excessive thirst?: No  Musculoskeletal Back pain?: Yes Joint pain?: Yes  Neurological Headaches?: Yes  Dizziness?: Yes  Psychologic Depression?: Yes Anxiety?: Yes  Physical Exam: BP (!) 109/49 (BP Location: Left Arm, Patient Position: Sitting, Cuff Size: Normal)   Pulse 74   Ht 5\' 5"  (1.651 m)   Wt 151 lb 3.2 oz (68.6 kg)   BMI 25.16 kg/m   Constitutional:  Alert and oriented, No acute distress.  Thin.   HEENT: [place] AT, moist mucus membranes.  Trachea midline, no masses. Cardiovascular: No clubbing, cyanosis, or edema. Respiratory: Normal respiratory effort, no increased work of breathing. GI: Abdomen is soft, nontender, nondistended, no abdominal masses Skin: No rashes, bruises or suspicious lesions. Neurologic: Grossly intact, no focal deficits, moving all 4 extremities. Psychiatric: Normal mood and affect.  Laboratory Data: Lab Results  Component Value Date   WBC 7.8 07/13/2016   HGB 11.4 (L) 07/13/2016   HCT 35.2 07/13/2016   MCV 78.5 (L) 07/13/2016   PLT 173 07/13/2016    Lab Results  Component Value Date   CREATININE 5.44 (H) 07/13/2016    Lab Results  Component Value Date   HGBA1C 5.1 04/08/2016   Pertinent Imaging: CLINICAL DATA:  Renal mass.  Follow-up.  End-stage renal disease.  EXAM: CT ABDOMEN WITHOUT AND WITH CONTRAST  TECHNIQUE: Multidetector CT imaging of the abdomen was performed following the standard protocol before and following the bolus administration of intravenous contrast.  CONTRAST:  100 cc of Isovue 370  COMPARISON:  04/21/2016.  02/16/2016.  11/12/2015.  FINDINGS: Lower chest: Mild bibasilar  septal thickening. Borderline cardiomegaly with small right and trace left pleural effusions. Both effusions are smaller. There is right-sided pleural thickening, suggesting chronicity. Right coronary artery atherosclerosis.  Hepatobiliary: Wedge-shaped area of arterial hyperenhancement in the right lobe of the liver measures maximally 2.5 cm on image 32/series 8. This is isodense to the remainder the liver on portal venous phase imaging. Not readily apparent on 11/12/2015. Hepatomegaly at 20.5 cm craniocaudal. Cholecystectomy, without biliary ductal dilatation.  Pancreas: Normal, without mass or ductal dilatation.  Spleen: Normal in size, without focal abnormality.  Adrenals/Urinary Tract: Normal adrenal glands. Moderate to marked bilateral renal atrophy. Renal vascular calcifications, without definite renal calculi. No hydronephrosis or abdominal ureteric stones.  Enhancing lateral upper pole right renal 11 mm lesion image 37/series 8 is unchanged in size since the prior exam of 11/12/2015.  More medial upper pole right renal 1.3 cm lesion image 40/ series 8 is also not significantly changed.  A far anterior 6 mm enhancing lesion in the upper pole right kidney is identified on image 38/series 8. This is also similar on the prior. No left-sided or new lesions are seen.  Stomach/Bowel: Normal stomach, without wall thickening. Colonic stool burden suggests constipation. Scattered colonic diverticula. Normal abdominal small bowel.  Vascular/Lymphatic: Advanced aortic and branch vessel atherosclerosis. Patent renal veins. Left periaortic node measures 10 mm on image 45/series 8 and is similar (when remeasured). Upper normal 6 mm retrocrural node is not significantly changed.  Other: No ascites.  Musculoskeletal: No acute osseous abnormality.  IMPRESSION: 1. Similar size and appearance of 3 upper pole right renal enhancing lesions, most consistent with renal cell  carcinomas. No evidence of metastatic disease. 2. Probable perfusion anomaly in the right lobe of the liver. Recommend attention on follow-up. 3. Decrease in right greater than left pleural effusions, with right pleural thickening suggesting chronicity. 4. Borderline retroperitoneal adenopathy is favored to be reactive but warrants followup attention. 5.  Possible constipation. 6. Advanced atherosclerosis. Suspect mild interstitial edema at the lung bases.  Electronically Signed   By: Abigail Miyamoto M.D.   On: 08/01/2016 08:55  CT scan personally reviewed today and with the patient.  Assessment & Plan:    1. Right renal mass x 3 Multiple right upper pole lesions measuring up to 13 mm, unchanged in greater than a year.  Options for ongoing management of these lesions were discussed today in detail. Given that she is on hemodialysis, nephrectomy would be an option although she has multiple medical comorbidities and does not think that she would tolerate surgery.  Ablation appears difficult given the multifocal lesions.  Given her overall clinical picture with multiple medical comorbidities on end-stage renal disease on hemodialysis, I would recommend continued observation given the stable lesions. We discussed that these lesions tend to grow only millimeters a year and lesions or 3 cm or less, risk of metastatic disease is less than 5%. She is agreeable with this plan.  Recommend follow-up repeat cross-sectional imaging in 6 months. If remains stable, we'll space out thereafter.  She understands the importance of continued follow-up.  - CT ABDOMEN PELVIS W WO CONTRAST; Future   Return in about 6 months (around 02/04/2017) for f/u CT abd/ pelvis.  Hollice Espy, MD  [facility] [address], [place],  [address] [phone]

## 2016-08-11 ENCOUNTER — Encounter: Payer: Self-pay | Admitting: *Deleted

## 2016-08-12 ENCOUNTER — Ambulatory Visit: Payer: Medicare Other | Admitting: Anesthesiology

## 2016-08-12 ENCOUNTER — Encounter (INDEPENDENT_AMBULATORY_CARE_PROVIDER_SITE_OTHER): Payer: Medicare Other

## 2016-08-12 ENCOUNTER — Ambulatory Visit
Admission: RE | Admit: 2016-08-12 | Discharge: 2016-08-12 | Disposition: A | Discharge status: Home / Self Care | Payer: Medicare Other | Source: Ambulatory Visit | Attending: Gastroenterology | Admitting: Gastroenterology

## 2016-08-12 ENCOUNTER — Encounter
Admission: RE | Disposition: A | Discharge status: Home / Self Care | Payer: Self-pay | Source: Ambulatory Visit | Attending: Gastroenterology

## 2016-08-12 DIAGNOSIS — Z794 Long term (current) use of insulin: Secondary | ICD-10-CM | POA: Insufficient documentation

## 2016-08-12 DIAGNOSIS — I5032 Chronic diastolic (congestive) heart failure: Secondary | ICD-10-CM | POA: Insufficient documentation

## 2016-08-12 DIAGNOSIS — I340 Nonrheumatic mitral (valve) insufficiency: Secondary | ICD-10-CM | POA: Insufficient documentation

## 2016-08-12 DIAGNOSIS — Z992 Dependence on renal dialysis: Secondary | ICD-10-CM | POA: Insufficient documentation

## 2016-08-12 DIAGNOSIS — F419 Anxiety disorder, unspecified: Secondary | ICD-10-CM | POA: Diagnosis not present

## 2016-08-12 DIAGNOSIS — K219 Gastro-esophageal reflux disease without esophagitis: Secondary | ICD-10-CM | POA: Diagnosis not present

## 2016-08-12 DIAGNOSIS — F329 Major depressive disorder, single episode, unspecified: Secondary | ICD-10-CM | POA: Insufficient documentation

## 2016-08-12 DIAGNOSIS — Z888 Allergy status to other drugs, medicaments and biological substances status: Secondary | ICD-10-CM | POA: Insufficient documentation

## 2016-08-12 DIAGNOSIS — K621 Rectal polyp: Secondary | ICD-10-CM | POA: Diagnosis not present

## 2016-08-12 DIAGNOSIS — E1140 Type 2 diabetes mellitus with diabetic neuropathy, unspecified: Secondary | ICD-10-CM | POA: Insufficient documentation

## 2016-08-12 DIAGNOSIS — K5730 Diverticulosis of large intestine without perforation or abscess without bleeding: Secondary | ICD-10-CM | POA: Diagnosis not present

## 2016-08-12 DIAGNOSIS — I2510 Atherosclerotic heart disease of native coronary artery without angina pectoris: Secondary | ICD-10-CM | POA: Insufficient documentation

## 2016-08-12 DIAGNOSIS — Z885 Allergy status to narcotic agent status: Secondary | ICD-10-CM | POA: Insufficient documentation

## 2016-08-12 DIAGNOSIS — Z1211 Encounter for screening for malignant neoplasm of colon: Secondary | ICD-10-CM | POA: Diagnosis present

## 2016-08-12 DIAGNOSIS — E1151 Type 2 diabetes mellitus with diabetic peripheral angiopathy without gangrene: Secondary | ICD-10-CM | POA: Insufficient documentation

## 2016-08-12 DIAGNOSIS — D631 Anemia in chronic kidney disease: Secondary | ICD-10-CM | POA: Insufficient documentation

## 2016-08-12 DIAGNOSIS — J449 Chronic obstructive pulmonary disease, unspecified: Secondary | ICD-10-CM | POA: Diagnosis not present

## 2016-08-12 DIAGNOSIS — N186 End stage renal disease: Secondary | ICD-10-CM | POA: Diagnosis not present

## 2016-08-12 DIAGNOSIS — I252 Old myocardial infarction: Secondary | ICD-10-CM | POA: Diagnosis not present

## 2016-08-12 DIAGNOSIS — D124 Benign neoplasm of descending colon: Secondary | ICD-10-CM | POA: Diagnosis not present

## 2016-08-12 DIAGNOSIS — Z955 Presence of coronary angioplasty implant and graft: Secondary | ICD-10-CM | POA: Insufficient documentation

## 2016-08-12 DIAGNOSIS — Z87891 Personal history of nicotine dependence: Secondary | ICD-10-CM | POA: Insufficient documentation

## 2016-08-12 DIAGNOSIS — K635 Polyp of colon: Secondary | ICD-10-CM | POA: Diagnosis not present

## 2016-08-12 DIAGNOSIS — M1990 Unspecified osteoarthritis, unspecified site: Secondary | ICD-10-CM | POA: Insufficient documentation

## 2016-08-12 DIAGNOSIS — I132 Hypertensive heart and chronic kidney disease with heart failure and with stage 5 chronic kidney disease, or end stage renal disease: Secondary | ICD-10-CM | POA: Diagnosis not present

## 2016-08-12 DIAGNOSIS — Z86010 Personal history of colonic polyps: Secondary | ICD-10-CM | POA: Insufficient documentation

## 2016-08-12 DIAGNOSIS — E1122 Type 2 diabetes mellitus with diabetic chronic kidney disease: Secondary | ICD-10-CM | POA: Insufficient documentation

## 2016-08-12 HISTORY — PX: COLONOSCOPY WITH PROPOFOL: SHX5780

## 2016-08-12 LAB — GLUCOSE, CAPILLARY: Glucose-Capillary: 139 mg/dL — ABNORMAL HIGH (ref 65–99)

## 2016-08-12 SURGERY — COLONOSCOPY WITH PROPOFOL
Anesthesia: General

## 2016-08-12 MED ORDER — PROPOFOL 10 MG/ML IV BOLUS
INTRAVENOUS | Status: DC | PRN
  Administered 2016-08-12: 30 mg via INTRAVENOUS

## 2016-08-12 MED ORDER — LIDOCAINE HCL 1 % IJ SOLN
1.0000 mL | Freq: Once | INTRAMUSCULAR | Status: AC
  Administered 2016-08-12: 1 mL via INTRADERMAL

## 2016-08-12 MED ORDER — MIDAZOLAM HCL 2 MG/2ML IJ SOLN
INTRAMUSCULAR | Status: DC | PRN
  Administered 2016-08-12: 1 mg via INTRAVENOUS

## 2016-08-12 MED ORDER — PROPOFOL 500 MG/50ML IV EMUL
INTRAVENOUS | Status: DC | PRN
  Administered 2016-08-12: 120 ug/kg/min via INTRAVENOUS

## 2016-08-12 MED ORDER — SODIUM CHLORIDE 0.9 % IV SOLN: INTRAVENOUS | Status: DC

## 2016-08-12 MED ORDER — FENTANYL CITRATE (PF) 100 MCG/2ML IJ SOLN
INTRAMUSCULAR | Status: DC | PRN
  Administered 2016-08-12: 50 ug via INTRAVENOUS

## 2016-08-12 MED ORDER — LIDOCAINE HCL (PF) 1 % IJ SOLN
INTRAMUSCULAR | Status: AC
  Administered 2016-08-12: 1 mL via INTRADERMAL
  Filled 2016-08-12: qty 2

## 2016-08-12 NOTE — Anesthesia Postprocedure Evaluation (Signed)
Anesthesia Post Note  Patient: [name]  Procedure(s) Performed: Procedure(s) (LRB): COLONOSCOPY WITH PROPOFOL (N/A)  Patient location during evaluation: PACU Anesthesia Type: General Level of consciousness: awake Pain management: pain level controlled Vital Signs Assessment: post-procedure vital signs reviewed and stable Respiratory status: nonlabored ventilation Anesthetic complications: no    Last Vitals:  Vitals:   08/12/16 0717 08/12/16 0930  BP: (!) 141/57 (!) 120/51  Pulse: 63 61  Resp: 18 18  Temp: (!) 36.1 C (!) 36.1 C    Last Pain:  Vitals:   08/12/16 0930  TempSrc: Tympanic                 VAN STAVEREN,Deondrea Aguado

## 2016-08-12 NOTE — H&P (Signed)
Outpatient short stay form Pre-procedure 08/12/2016 8:34 AM Robin Sails MD  Primary Physician: Dr. Ellin Goodie  Reason for visit:  Colonoscopy  History of present illness:  Patient is a 55 year old female presenting today as above. She has a personal history of adenomatous colon polyps. She did undergo colonoscopy with a fecal transplant for treatment of C. difficile. There was a polyp noted in the descending colon at that time however was not removed due to the clinical situation. She is returning for completion of that. It was indicated that patient does take Plavix however review of her medications from [facility] showed that she does not take this medication. She takes no other blood thinning agents or aspirin products. She tolerated her prep well.   Current Facility-Administered Medications:  .  0.9 %  sodium chloride infusion, , Intravenous, Continuous, Robin Sails, MD .  0.9 %  sodium chloride infusion, , Intravenous, Continuous, Robin Sails, MD .  lidocaine (PF) (XYLOCAINE) 1 % injection, , , ,   Prescriptions Prior to Admission  Medication Sig Dispense Refill Last Dose  . acidophilus (RISAQUAD) CAPS capsule Take 1 capsule by mouth daily.   Taking  . albuterol (PROVENTIL HFA;VENTOLIN HFA) 108 (90 Base) MCG/ACT inhaler Inhale 2 puffs into the lungs every 4 (four) hours as needed for shortness of breath.    Taking  . albuterol (PROVENTIL) (2.5 MG/3ML) 0.083% nebulizer solution Take 2.5 mg by nebulization every 4 (four) hours as needed for wheezing or shortness of breath.   Taking  . amLODipine (NORVASC) 10 MG tablet Take 10 mg by mouth daily.   Taking  . atorvastatin (LIPITOR) 20 MG tablet Take 20 mg by mouth at bedtime.    Taking  . cetirizine (ZYRTEC) 10 MG tablet Take 10 mg by mouth daily.    Taking  . diclofenac sodium (VOLTAREN) 1 % GEL Apply 2 g topically 4 (four) times daily.   Taking  . FLUoxetine (PROZAC) 20 MG capsule Take 1 capsule (20 mg  total) by mouth daily. (Patient taking differently: Take 20 mg by mouth at bedtime. ) 30 capsule 0 Taking  . glucose 4 GM chewable tablet Chew 3-4 tablets by mouth as needed for low blood sugar.   Taking  . hydrALAZINE (APRESOLINE) 25 MG tablet Take 25 mg by mouth 3 (three) times daily.   Taking  . HYDROcodone-acetaminophen (NORCO/VICODIN) 5-325 MG tablet Take 1 tablet by mouth every 6 (six) hours as needed for moderate pain or severe pain. 15 tablet 0 Taking  . hydrOXYzine (ATARAX/VISTARIL) 25 MG tablet Take 25 mg by mouth every 6 (six) hours as needed for itching.   Taking  . insulin aspart (NOVOLOG) 100 UNIT/ML injection Inject 0-6 Units into the skin 3 (three) times daily before meals. Per sliding scale   Taking  . insulin glargine (LANTUS) 100 UNIT/ML injection Inject 8 Units into the skin at bedtime.   Taking  . lamoTRIgine (LAMICTAL) 25 MG tablet Take 25 mg by mouth at bedtime.   Taking  . lipase/protease/amylase (CREON) 12000 UNITS CPEP capsule Take 36,000 Units by mouth 2 (two) times daily.    Taking  . lubiprostone (AMITIZA) 24 MCG capsule Take 24 mcg by mouth daily.   Taking  . meclizine (ANTIVERT) 12.5 MG tablet Take 1 tablet (12.5 mg total) by mouth 3 (three) times daily as needed for dizziness. (Patient taking differently: Take 12.5 mg by mouth every 8 (eight) hours as needed for dizziness. ) 30 tablet 0 Taking  .  multivitamin (RENA-VIT) TABS tablet Take 1 tablet by mouth every evening.    Taking  . OLANZapine (ZYPREXA) 5 MG tablet Take 0.5 tablets (2.5 mg total) by mouth at bedtime. (Patient taking differently: Take 5 mg by mouth at bedtime. ) 30 tablet 0 Taking  . omega-3 acid ethyl esters (LOVAZA) 1 g capsule Take 1 g by mouth at bedtime.   Taking  . pantoprazole (PROTONIX) 40 MG tablet Take 1 tablet (40 mg total) by mouth 2 (two) times daily before a meal. 60 tablet 2 Taking  . pregabalin (LYRICA) 25 MG capsule Take 25 mg by mouth daily.   Taking  . promethazine (PHENERGAN) 25 MG  suppository Place 1 suppository (25 mg total) rectally every 6 (six) hours as needed for nausea. 12 suppository 0 Taking  . promethazine (PHENERGAN) 25 MG suppository Place 1 suppository (25 mg total) rectally every 6 (six) hours as needed for nausea. Stop taking the oral promethazine (phenergan).  Use these suppositories instead. 12 suppository 0 Taking  . ranitidine (ZANTAC) 150 MG tablet Take 150 mg by mouth daily. In the afternoon   Taking  . rifaximin (XIFAXAN) 550 MG TABS tablet Take 550 mg by mouth 2 (two) times daily.   Taking  . sevelamer carbonate (RENVELA) 800 MG tablet Take 2,400 mg by mouth 3 (three) times daily with meals.    Taking  . vitamin B-12 (CYANOCOBALAMIN) 500 MCG tablet Take 1,000 mcg by mouth daily.   Taking     Allergies  Allergen Reactions  . Compazine [Prochlorperazine Edisylate] Anaphylaxis and Nausea And Vomiting    23 Jul - patient relates that she takes promethazine frequently with no problems.  . Ace Inhibitors Swelling  . Ativan [Lorazepam] Other (See Comments)    Reaction:  Hallucinations and headaches  . Codeine Nausea And Vomiting  . Dilaudid [Hydromorphone Hcl] Other (See Comments)    Delirium  . Gabapentin Other (See Comments)    Reaction:  Unknown   . Losartan Other (See Comments)    Reaction:  Unknown   . Ondansetron Other (See Comments)    Reaction:  Unknown   . Prochlorperazine Other (See Comments)    Reaction:  Unknown   . Reglan [Metoclopramide]     Per patient her Dr. Evelina Bucy her off it   . Scopolamine Other (See Comments)    Reaction:  Unknown   . Zofran [Ondansetron Hcl] Other (See Comments)    Reaction:  hallucinations   . Oxycodone Anxiety  . Tape Rash     Past Medical History:  Diagnosis Date  . Anemia   . Anxiety   . Arthritis   . Asthma   . chronic diastolic CHF 3/78/5885  . COPD (chronic obstructive pulmonary disease) ([place])   . Coronary artery disease    a. cath 2013: stenting to RCA (report not available); b. cath  2014: LM nl, pLAD 40%, mLAD nl, ost LCx 40%, mid LCx nl, pRCA 30% @ site of prior stent, mRCA 50%  . Depression   . Diabetes mellitus without complication ([place])   . Diabetic neuropathy ([place])   . dialysis 2006  . ESRD (end stage renal disease) on dialysis ([employer])    M-W-F  . GERD (gastroesophageal reflux disease)   . Headache   . Hx of pancreatitis 2015  . Hypertension   . Mitral regurgitation    a. echo 10/2013: EF 62%, noWMA, mildly dilated LA, mild to mod MR/TR, GR1DD  . Myocardial infarction   . Pneumonia   .  Renal insufficiency    Pt is on dialysis on M,W + F.    Review of systems:      Physical Exam    Heart and lungs: Regular rate and rhythm without rub or gallop, lungs are bilaterally clear.    HEENT: Normocephalic atraumatic eyes are anicteric    Other:     Pertinant exam for procedure: Soft nontender nondistended bowel sounds positive normoactive.    Planned proceedures: Colonoscopy and indicated procedures. I have discussed the risks benefits and complications of procedures to include not limited to bleeding, infection, perforation and the risk of sedation and the patient wishes to proceed.    Robin Sails, MD Gastroenterology 08/12/2016  8:34 AM

## 2016-08-12 NOTE — Anesthesia Preprocedure Evaluation (Signed)
Anesthesia Evaluation  Patient identified by MRN, date of birth, ID band Patient awake    Reviewed: Allergy & Precautions, NPO status , Patient's Chart, lab work & pertinent test results  Airway Mallampati: II       Dental  (+) Upper Dentures, Lower Dentures   Pulmonary COPD, former smoker,     + decreased breath sounds      Cardiovascular Exercise Tolerance: Poor hypertension, + CAD, + Past MI, + Peripheral Vascular Disease and +CHF   Rhythm:Regular     Neuro/Psych  Headaches, Anxiety Depression    GI/Hepatic Neg liver ROS, GERD  Medicated,  Endo/Other  diabetes, Type 2  Renal/GU DialysisRenal disease     Musculoskeletal   Abdominal Normal abdominal exam  (+)   Peds  Hematology  (+) anemia ,   Anesthesia Other Findings   Reproductive/Obstetrics                             Anesthesia Physical Anesthesia Plan  ASA: IV  Anesthesia Plan: General   Post-op Pain Management:    Induction: Intravenous  Airway Management Planned: Natural Airway and Nasal Cannula  Additional Equipment:   Intra-op Plan:   Post-operative Plan:   Informed Consent: I have reviewed the patients History and Physical, chart, labs and discussed the procedure including the risks, benefits and alternatives for the proposed anesthesia with the patient or authorized representative who has indicated his/her understanding and acceptance.     Plan Discussed with: CRNA  Anesthesia Plan Comments:         Anesthesia Quick Evaluation

## 2016-08-12 NOTE — Op Note (Signed)
[facility] Patient Name: [name] Procedure Date: 08/12/2016 8:41 AM MRN: [id number] Account #: [id number] Date of Birth: 1961/05/10 Admit Type: Outpatient Age: 55 Room: [facility] ENDO ROOM 3 Gender: Female Note Status: Finalized Procedure:            Colonoscopy Indications:          Personal history of colonic polyps Providers:            Lollie Sails, MD Referring MD:         Remus Blake MD, MD (Referring MD) Medicines:            Monitored Anesthesia Care Complications:        No immediate complications. Procedure:            Pre-Anesthesia Assessment:                       - ASA Grade Assessment: III - A patient with severe                        systemic disease.                       After obtaining informed consent, the colonoscope was                        passed under direct vision. Throughout the procedure,                        the patient's blood pressure, pulse, and oxygen                        saturations were monitored continuously. The                        Colonoscope was introduced through the anus and                        advanced to the the cecum, identified by appendiceal                        orifice and ileocecal valve. The colonoscopy was                        unusually difficult due to significant looping and                        maintaining insufflation. Successful completion of the                        procedure was aided by changing the patient to a supine                        position, changing the patient to a prone position and                        using manual pressure. The patient tolerated the                        procedure well. The quality of the bowel preparation  was good. Findings:      A 7 mm polyp was found in the mid descending colon. The polyp was       pedunculated. The polyp was removed with a hot snare. Resection and       retrieval were complete.   A 2 mm polyp was found in the proximal sigmoid colon. The polyp was       sessile. The polyp was removed with a cold biopsy forceps. Resection and       retrieval were complete.      A 5 mm polyp was found in the proximal descending colon. The polyp was       sessile. The polyp was removed with a cold snare. Resection and       retrieval were complete.      A 2 mm polyp was found in the ileocecal valve. The polyp was flat. The       polyp was removed with a cold biopsy forceps. Resection and retrieval       were complete.      Biopsies for histology were taken with a cold forceps from the left       colon for evaluation of microscopic colitis.      Two sessile polyps were found in the rectum. The polyps were 1 mm in       size. These polyps were removed with a cold biopsy forceps. Resection       and retrieval were complete.      Multiple small-mouthed diverticula were found in the sigmoid colon,       descending colon, transverse colon and ascending colon.      The digital rectal exam was normal. Impression:           - One 7 mm polyp in the mid descending colon, removed                        with a hot snare. Resected and retrieved.                       - One 2 mm polyp in the proximal sigmoid colon, removed                        with a cold biopsy forceps. Resected and retrieved.                       - One 5 mm polyp in the proximal descending colon,                        removed with a cold snare. Resected and retrieved.                       - One 2 mm polyp at the ileocecal valve, removed with a                        cold biopsy forceps. Resected and retrieved.                       - Two 1 mm polyps in the rectum, removed with a cold                        biopsy forceps. Resected and retrieved.                       -  Diverticulosis in the sigmoid colon, in the                        descending colon, in the transverse colon and in the                        ascending  colon.                       - Biopsies were taken with a cold forceps from the left                        colon for evaluation of microscopic colitis. Recommendation:       - Discharge patient to home.                       - Await pathology results.                       - Telephone GI clinic for pathology results in 1 week. Procedure Code(s):    --- Professional ---                       [phone], Colonoscopy, flexible; with removal of tumor(s),                        polyp(s), or other lesion(s) by snare technique                       [id number], 78, Colonoscopy, flexible; with biopsy, single                        or multiple Diagnosis Code(s):    --- Professional ---                       D12.5, Benign neoplasm of sigmoid colon                       D12.4, Benign neoplasm of descending colon                       D12.0, Benign neoplasm of cecum                       K62.1, Rectal polyp                       Z86.010, Personal history of colonic polyps                       K57.30, Diverticulosis of large intestine without                        perforation or abscess without bleeding CPT copyright 2016 [employer]. All rights reserved. The codes documented in this report are preliminary and upon coder review may  be revised to meet current compliance requirements. Lollie Sails, MD 08/12/2016 9:30:40 AM This report has been signed electronically. Number of Addenda: 0 Note Initiated On: 08/12/2016 8:41 AM Scope Withdrawal Time: 0 hours 10 minutes 36 seconds  Total Procedure Duration: 0 hours 37 minutes 38 seconds       [facility]

## 2016-08-12 NOTE — Transfer of Care (Signed)
Immediate Anesthesia Transfer of Care Note  Patient: [name]  Procedure(s) Performed: Procedure(s): COLONOSCOPY WITH PROPOFOL (N/A)  Patient Location: PACU  Anesthesia Type:General  Level of Consciousness: awake  Airway & Oxygen Therapy: Patient Spontanous Breathing and Patient connected to nasal cannula oxygen  Post-op Assessment: Report given to RN  Post vital signs: Reviewed  Last Vitals:  Vitals:   08/12/16 0717  BP: (!) 141/57  Pulse: 63  Resp: 18  Temp: (!) 36.1 C    Last Pain:  Vitals:   08/12/16 0717  TempSrc: Oral         Complications: No apparent anesthesia complications

## 2016-08-13 ENCOUNTER — Encounter (INDEPENDENT_AMBULATORY_CARE_PROVIDER_SITE_OTHER): Payer: Medicare Other

## 2016-08-13 ENCOUNTER — Ambulatory Visit (INDEPENDENT_AMBULATORY_CARE_PROVIDER_SITE_OTHER): Payer: Medicare Other | Admitting: Vascular Surgery

## 2016-08-13 ENCOUNTER — Encounter: Payer: Self-pay | Admitting: Gastroenterology

## 2016-08-13 VITALS — BP 134/78 | HR 72 | Resp 16 | Ht 65.0 in | Wt 161.0 lb

## 2016-08-13 DIAGNOSIS — I890 Lymphedema, not elsewhere classified: Secondary | ICD-10-CM

## 2016-08-13 LAB — SURGICAL PATHOLOGY

## 2016-08-13 IMAGING — DX DG CHEST 1V PORT
1 series · 1 of 1 positions shown · non-contrast
Comparison: 03/27/2016

CLINICAL DATA: Altered mental status post dialysis today

EXAM:
PORTABLE CHEST 1 VIEW

[chest ap]
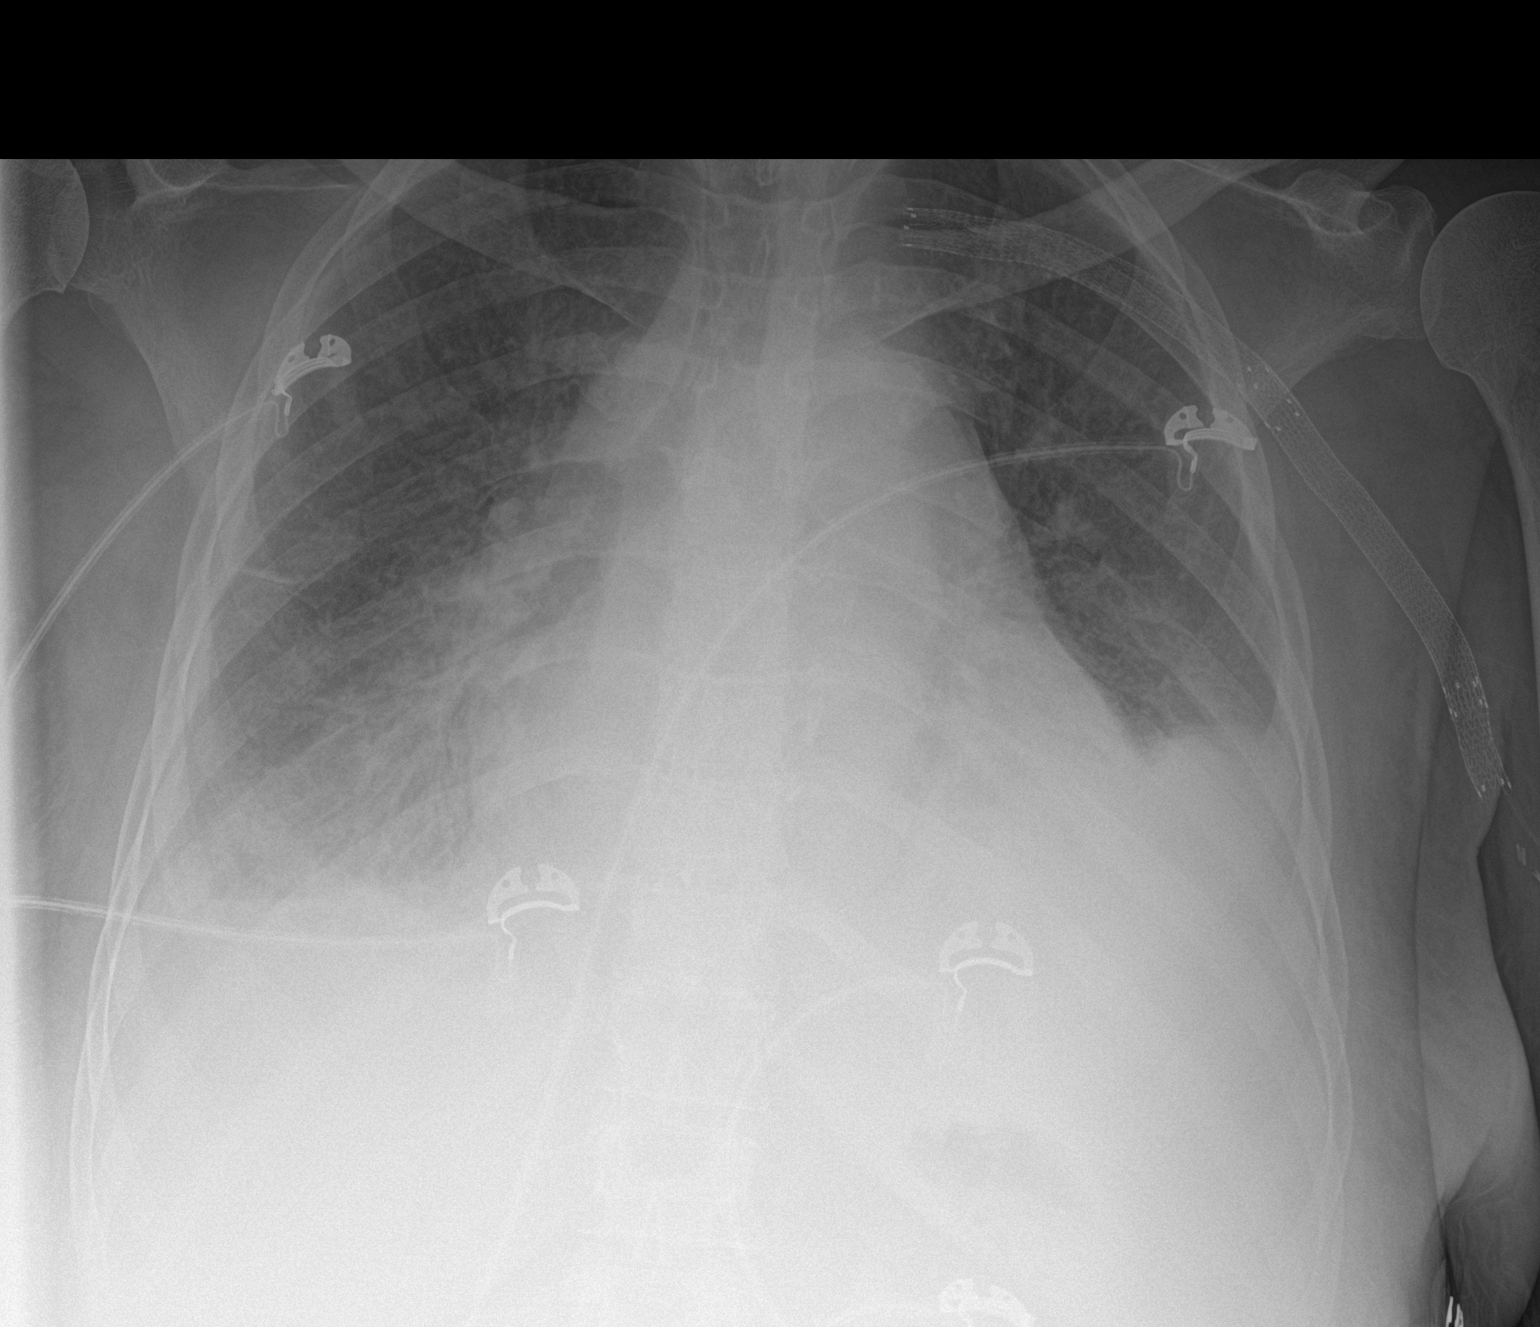

[1 of 1 positions shown; findings below may reference images not displayed]

FINDINGS: Cardiomegaly is noted. Mild interstitial prominence bilateral
without convincing pulmonary edema. Bilateral small pleural effusion
again noted. Worsening atelectasis or infiltrate in right middle
lobe.
IMPRESSION: Bilateral small pleural effusion again noted. No convincing
pulmonary edema. Worsening atelectasis or infiltrate in right middle
lobe.

## 2016-08-13 NOTE — Progress Notes (Signed)
History of Present Illness  There is no documented history at this time  Assessments & Plan   There are no diagnoses linked to this encounter.    Additional instructions  Subjective:  Patient presents with venous ulcer of the Bilateral lower extremity.    Procedure:  3 layer unna wrap was placed Bilateral lower extremity.   Plan:   Follow up in one week.  

## 2016-08-14 IMAGING — CT CT HEAD W/O CM
3 series · 17 of 47 positions shown, 20 images · non-contrast
Comparison: CT scan of April 24, 2015.

CLINICAL DATA: Altered mental status.

EXAM:
CT HEAD WITHOUT CONTRAST
TECHNIQUE: Contiguous axial images were obtained from the base of the skull
through the vertex without intravenous contrast.

[Series 2: head wo · axial · 0.39mm/px · z∈[+245,+370]mm · 11 of 30 slices shown, 14 images]
[im 3/30  brain]
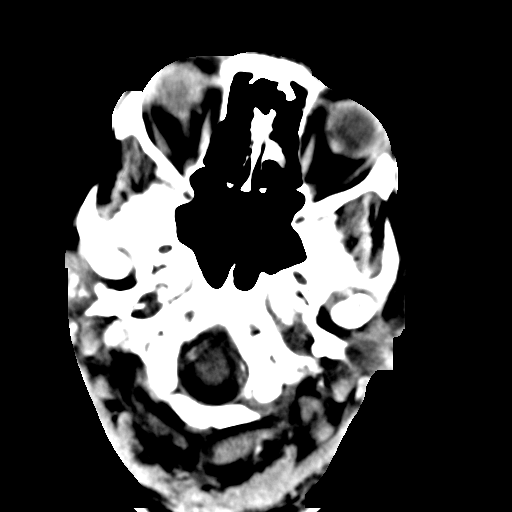
[im 3/30  bone]
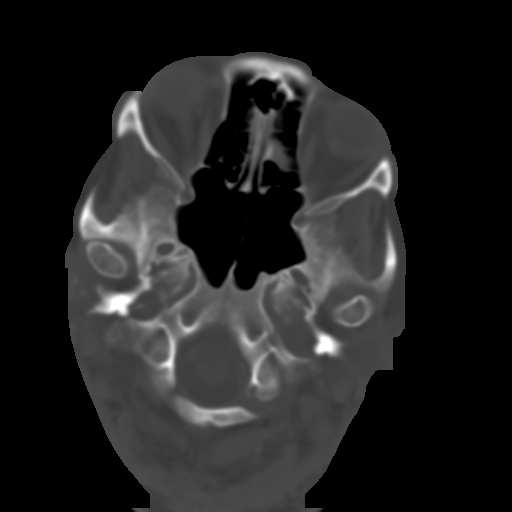
[im 5/30  brain]
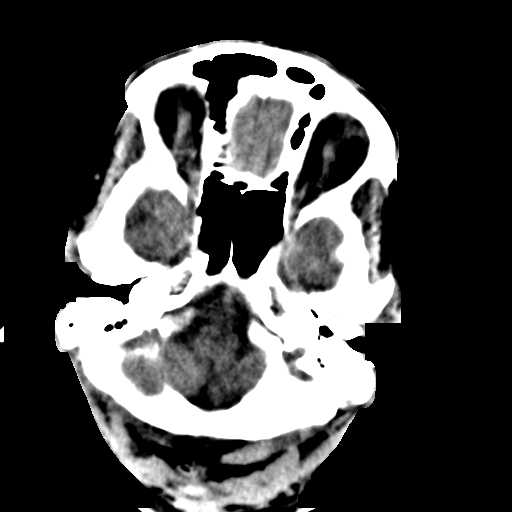
[im 8/30  brain]
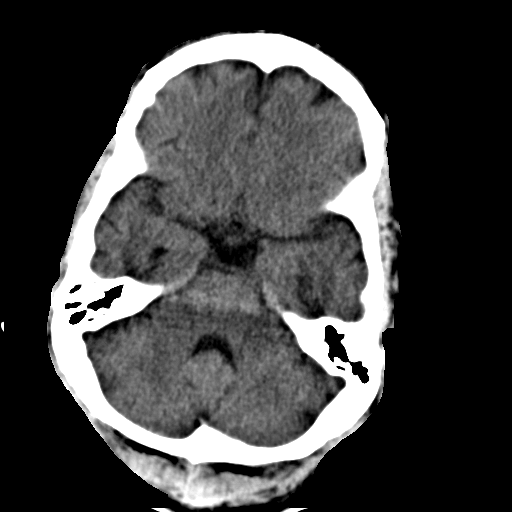
[im 10/30  brain]
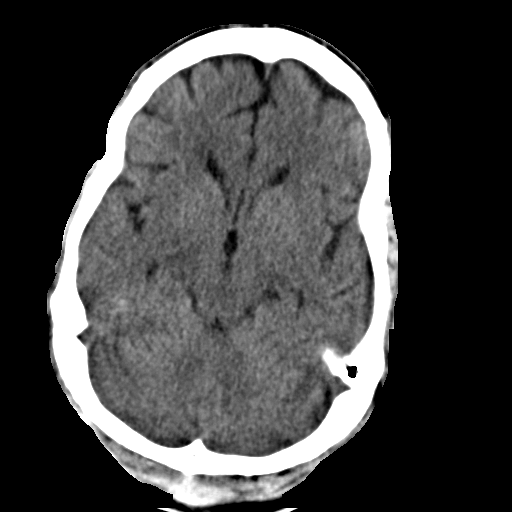
[im 13/30  brain]
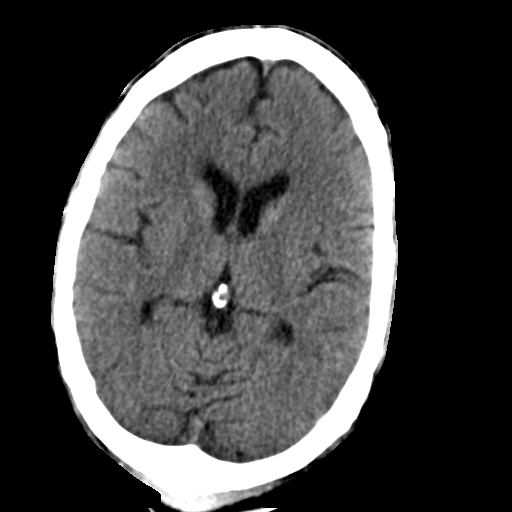
[im 13/30  bone]
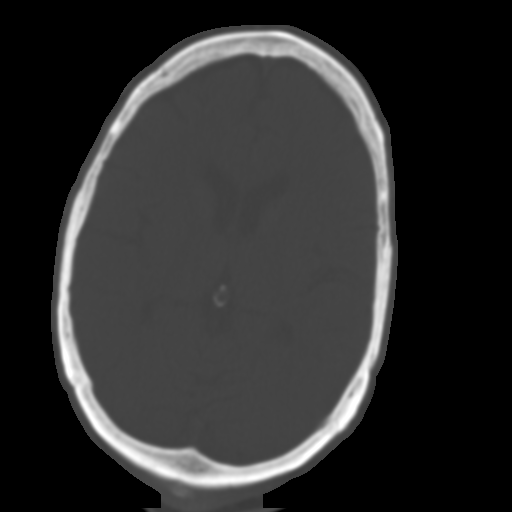
[im 16/30  brain]
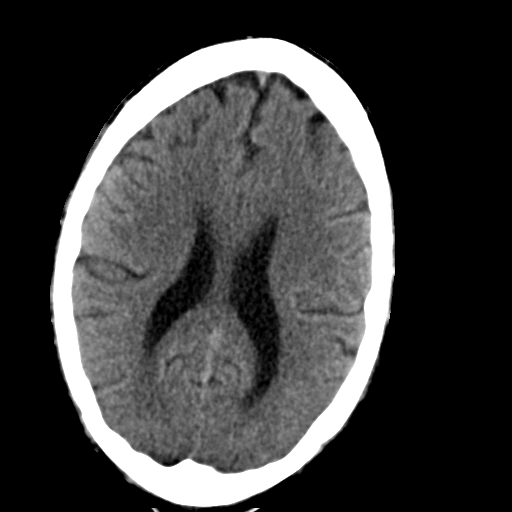
[im 18/30  brain]
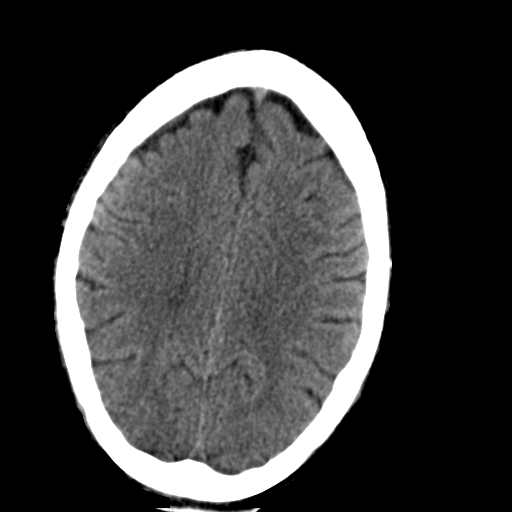
[im 21/30  brain]
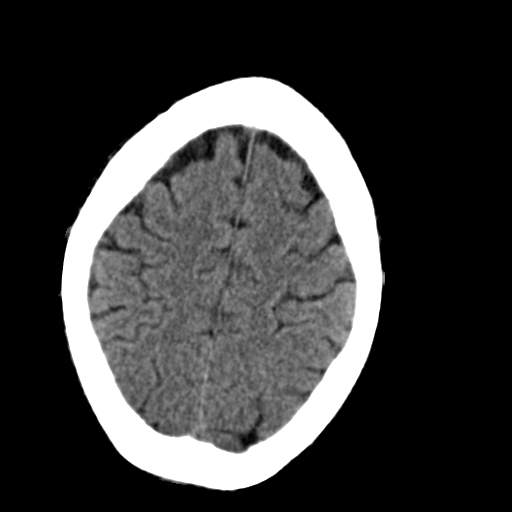
[im 23/30  brain]
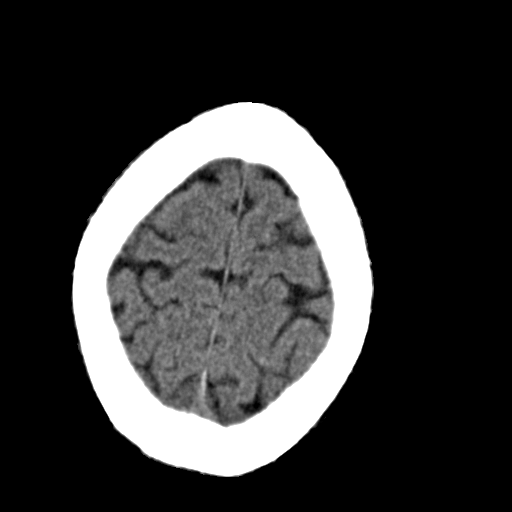
[im 23/30  bone]
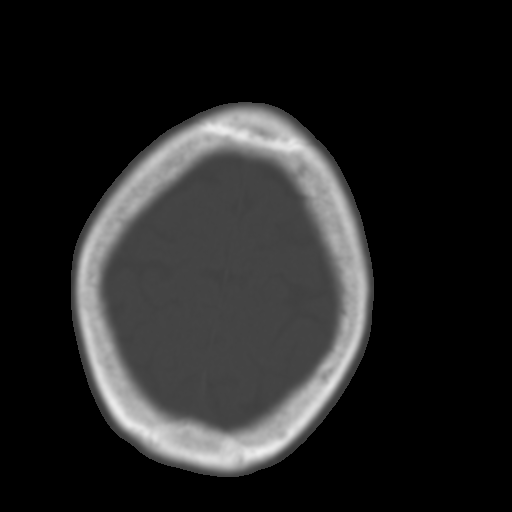
[im 26/30  brain]
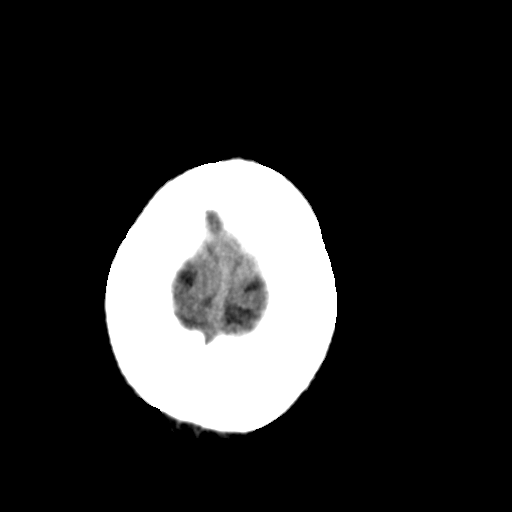
[im 28/30  brain]
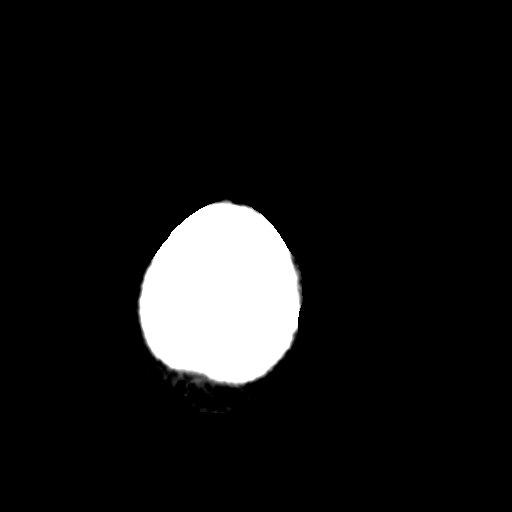

[Series 4: coronal soft · coronal · 0.31mm/px · 3 of 62 slices shown]
[im 21/62  brain]
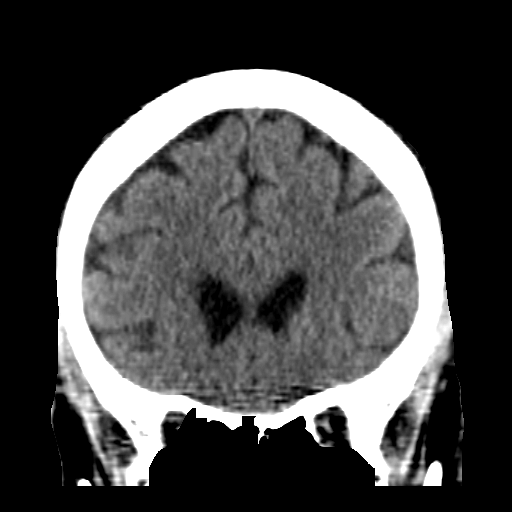
[im 28/62  brain]
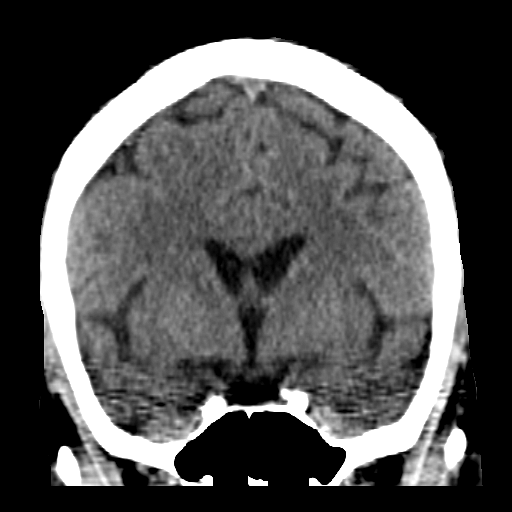
[im 34/62  brain]
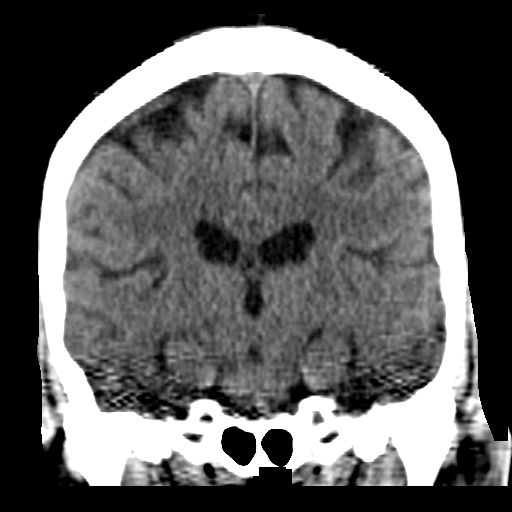

[Series 5: sagittal soft · sagittal · 0.41mm/px · 3 of 47 slices shown]
[im 16/47  brain]
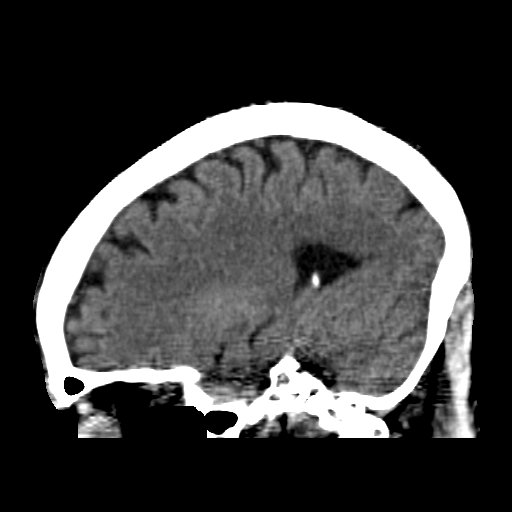
[im 24/47  brain]
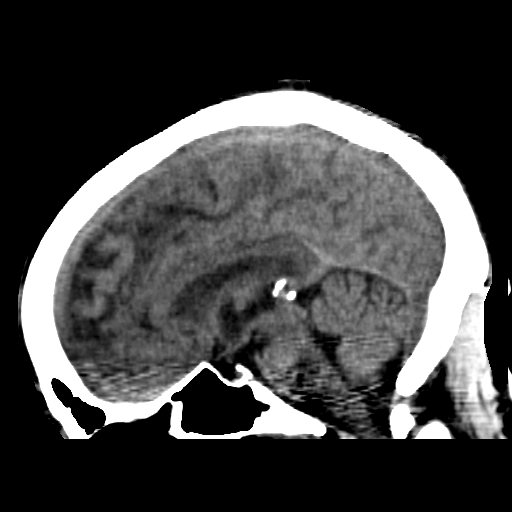
[im 31/47  brain]
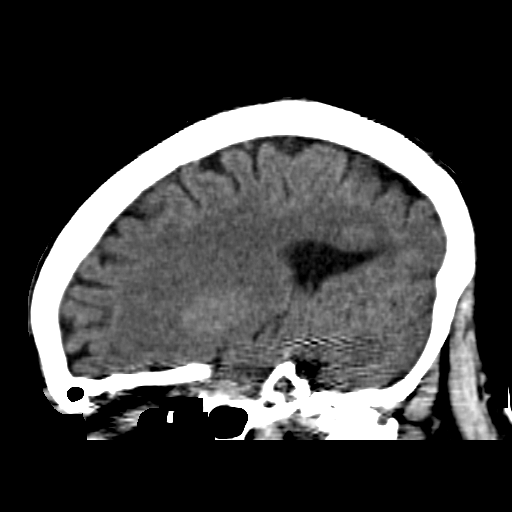

[17 of 47 positions shown; findings below may reference images not displayed]

FINDINGS: Bony calvarium appears intact. No mass effect or midline shift is
noted. Ventricular size is within normal limits. There is no
evidence of mass lesion, hemorrhage or acute infarction.
IMPRESSION: Normal head CT.

## 2016-08-19 ENCOUNTER — Encounter (INDEPENDENT_AMBULATORY_CARE_PROVIDER_SITE_OTHER): Payer: Self-pay | Admitting: Vascular Surgery

## 2016-08-19 ENCOUNTER — Ambulatory Visit (INDEPENDENT_AMBULATORY_CARE_PROVIDER_SITE_OTHER): Payer: Medicare Other | Admitting: Vascular Surgery

## 2016-08-19 VITALS — BP 125/54 | HR 74 | Resp 16 | Ht 65.5 in | Wt 160.0 lb

## 2016-08-19 DIAGNOSIS — I890 Lymphedema, not elsewhere classified: Secondary | ICD-10-CM

## 2016-08-19 NOTE — Progress Notes (Signed)
History of Present Illness  There is no documented history at this time  Assessments & Plan   There are no diagnoses linked to this encounter.    Additional instructions  Subjective:  Patient presents with venous ulcer of the Bilateral lower extremity.    Procedure:  3 layer unna wrap was placed Bilateral lower extremity.   Plan:   Follow up in one week.  

## 2016-08-26 ENCOUNTER — Encounter (INDEPENDENT_AMBULATORY_CARE_PROVIDER_SITE_OTHER): Payer: Self-pay | Admitting: Vascular Surgery

## 2016-08-26 ENCOUNTER — Ambulatory Visit (INDEPENDENT_AMBULATORY_CARE_PROVIDER_SITE_OTHER): Payer: Medicare Other | Admitting: Vascular Surgery

## 2016-08-26 VITALS — BP 152/68 | HR 74 | Resp 16 | Ht 65.0 in | Wt 161.0 lb

## 2016-08-26 DIAGNOSIS — I890 Lymphedema, not elsewhere classified: Secondary | ICD-10-CM

## 2016-08-26 DIAGNOSIS — Z992 Dependence on renal dialysis: Secondary | ICD-10-CM | POA: Diagnosis not present

## 2016-08-26 DIAGNOSIS — N186 End stage renal disease: Secondary | ICD-10-CM | POA: Diagnosis not present

## 2016-08-26 DIAGNOSIS — E1159 Type 2 diabetes mellitus with other circulatory complications: Secondary | ICD-10-CM | POA: Diagnosis not present

## 2016-08-26 NOTE — Progress Notes (Signed)
Subjective:    Patient ID: [name], female    DOB: 01/04/1961, 54 y.o.   MRN: [id number] Chief Complaint  Patient presents with  . Follow-up   Patient presents for a monthly bilateral lower extremity lymphedema follow up. Swelling has improved however she is still experiencing "growths" on her lower extremity. States these "growths" are "red", "hard" and "tender". She has been treated with ABX by her PCP without any improvement. Zinc Oxide unna wraps have also not provided any improvement. They do not drain. She denies any fever, nausea or vomiting. Denies any erythema to her lower extremity or ulcerations.    Review of Systems  Constitutional: Negative.   HENT: Negative.   Eyes: Negative.   Respiratory: Negative.   Cardiovascular: Positive for leg swelling.  Gastrointestinal: Negative.   Endocrine: Negative.   Genitourinary:       ESRD  Musculoskeletal: Negative.   Skin:       Lower Extremity Growths  Allergic/Immunologic: Negative.   Neurological: Negative.   Hematological: Negative.   Psychiatric/Behavioral: Negative.       Objective:   Physical Exam Constitutional: She is oriented to person, place, and time. She appears well-developed and well-nourished.  HENT:  Head: Normocephalic and atraumatic.  Eyes: Conjunctivae and EOM are normal. Pupils are equal, round, and reactive to light.  Neck: Normal range of motion.  Cardiovascular: Normal rate, regular rhythm, normal heart sounds and intact distal pulses.   Pulses:      Radial pulses are 2+ on the right side, and 2+ on the left side.       Dorsalis pedis pulses are 1+ on the right side, and 1+ on the left side.       Posterior tibial pulses are 1+ on the right side, and 1+ on the left side.  Pulmonary/Chest: Effort normal and breath sounds normal.  Abdominal: Soft. Bowel sounds are normal.  Musculoskeletal: She exhibits edema (Moderate Bilateral Edema).  Neurological: She is alert and oriented to person, place, and  time.  Skin:  Cellulitis noted on bilateral lower extremities has resolved.  Severe stasis dermatitis noted on bilateral lower extremities.  3cm raised red areas with skin intact, hard noted on lower extremity.   Psychiatric: She has a normal mood and affect. Her behavior is normal. Judgment and thought content normal.   BP (!) 152/68   Pulse 74   Resp 16   Ht 5\' 5"  (1.651 m)   Wt 161 lb (73 kg)   BMI 26.79 kg/m   Past Medical History:  Diagnosis Date  . Anemia   . Anxiety   . Arthritis   . Asthma   . chronic diastolic CHF 05/18/785  . COPD (chronic obstructive pulmonary disease) ([place])   . Coronary artery disease    a. cath 2013: stenting to RCA (report not available); b. cath 2014: LM nl, pLAD 40%, mLAD nl, ost LCx 40%, mid LCx nl, pRCA 30% @ site of prior stent, mRCA 50%  . Depression   . Diabetes mellitus without complication ([place])   . Diabetic neuropathy ([place])   . dialysis 2006  . ESRD (end stage renal disease) on dialysis ([place])    M-W-F  . GERD (gastroesophageal reflux disease)   . Headache   . Hx of pancreatitis 2015  . Hypertension   . Mitral regurgitation    a. echo 10/2013: EF 62%, noWMA, mildly dilated LA, mild to mod MR/TR, GR1DD  . Myocardial infarction   . Pneumonia   .  Renal insufficiency    Pt is on dialysis on M,W + F.   Social History   Social History  . Marital status: Divorced    Spouse name: N/A  . Number of children: N/A  . Years of education: N/A   Occupational History  . Not on file.   Social History Main Topics  . Smoking status: Former Smoker    Packs/day: 0.50    Types: Cigarettes    Quit date: 02/13/2015  . Smokeless tobacco: Never Used  . Alcohol use No  . Drug use: No  . Sexual activity: Not on file   Other Topics Concern  . Not on file   Social History Narrative  . No narrative on file   Past Surgical History:  Procedure Laterality Date  . ABDOMINAL HYSTERECTOMY    . APPENDECTOMY    . CARDIAC CATHETERIZATION Left  07/26/2015   Procedure: Left Heart Cath and Coronary Angiography;  Surgeon: Dionisio David, MD;  Location: [place] CV [facility];  Service: Cardiovascular;  Laterality: Left;  . CHOLECYSTECTOMY    . COLONOSCOPY WITH PROPOFOL N/A 08/12/2016   Procedure: COLONOSCOPY WITH PROPOFOL;  Surgeon: Lollie Sails, MD;  Location: [facility] ENDOSCOPY;  Service: Endoscopy;  Laterality: N/A;  . DIALYSIS FISTULA CREATION Left    upper arm  . ESOPHAGOGASTRODUODENOSCOPY N/A 03/08/2015   Procedure: ESOPHAGOGASTRODUODENOSCOPY (EGD);  Surgeon: Manya Silvas, MD;  Location: [facility] ENDOSCOPY;  Service: Endoscopy;  Laterality: N/A;  . ESOPHAGOGASTRODUODENOSCOPY (EGD) WITH PROPOFOL N/A 03/18/2016   Procedure: ESOPHAGOGASTRODUODENOSCOPY (EGD) WITH PROPOFOL;  Surgeon: [name], MD;  Location: [facility] ENDOSCOPY;  Service: Endoscopy;  Laterality: N/A;  . EYE SURGERY    . FECAL TRANSPLANT N/A 08/23/2015   Procedure: FECAL TRANSPLANT;  Surgeon: Manya Silvas, MD;  Location: [facility] ENDOSCOPY;  Service: Endoscopy;  Laterality: N/A;  . PERIPHERAL VASCULAR CATHETERIZATION N/A 12/20/2015   Procedure: Thrombectomy of dialysis access versus permcath placement;  Surgeon: Algernon Huxley, MD;  Location: [place] [facility] LAB;  Service: Cardiovascular;  Laterality: N/A;  . PERIPHERAL VASCULAR CATHETERIZATION N/A 12/20/2015   Procedure: A/V Shunt Intervention;  Surgeon: Algernon Huxley, MD;  Location: [place] [facility];  Service: Cardiovascular;  Laterality: N/A;  . PERIPHERAL VASCULAR CATHETERIZATION N/A 12/20/2015   Procedure: A/V Shuntogram/Fistulagram;  Surgeon: Algernon Huxley, MD;  Location: [place] [facility] LAB;  Service: Cardiovascular;  Laterality: N/A;  . PERIPHERAL VASCULAR CATHETERIZATION N/A 01/02/2016   Procedure: A/V Shuntogram/Fistulagram;  Surgeon: Algernon Huxley, MD;  Location: [place] [facility];  Service: Cardiovascular;  Laterality: N/A;  . PERIPHERAL VASCULAR CATHETERIZATION N/A 01/02/2016   Procedure: A/V Shunt Intervention;   Surgeon: Algernon Huxley, MD;  Location: [place] [facility];  Service: Cardiovascular;  Laterality: N/A;   Family History  Problem Relation Age of Onset  . Kidney disease Mother   . Diabetes Mother    Allergies  Allergen Reactions  . Compazine [Prochlorperazine Edisylate] Anaphylaxis and Nausea And Vomiting    23 Jul - patient relates that she takes promethazine frequently with no problems.  . Ace Inhibitors Swelling  . Ativan [Lorazepam] Other (See Comments)    Reaction:  Hallucinations and headaches  . Codeine Nausea And Vomiting  . Dilaudid [Hydromorphone Hcl] Other (See Comments)    Delirium  . Gabapentin Other (See Comments)    Reaction:  Unknown   . Losartan Other (See Comments)    Reaction:  Unknown   . Ondansetron Other (See Comments)    Reaction:  Unknown   .  Prochlorperazine Other (See Comments)    Reaction:  Unknown   . Reglan [Metoclopramide]     Per patient her Dr. Evelina Bucy her off it   . Scopolamine Other (See Comments)    Reaction:  Unknown   . Zofran [Ondansetron Hcl] Other (See Comments)    Reaction:  hallucinations   . Oxycodone Anxiety  . Tape Rash      Assessment & Plan:  Patient presents for a monthly bilateral lower extremity lymphedema follow up. Swelling has improved however she is still experiencing "growths" on her lower extremity. States these "growths" are "red", "hard" and "tender". She has been treated with ABX by her PCP without any improvement. Zinc Oxide unna wraps have also not provided any improvement. They do not drain. She denies any fever, nausea or vomiting. Denies any erythema to her lower extremity or ulcerations.   1. Lymphedema - some improvement Continue unna boot therapy to her bilateral lower extremity until swelling is controlled. Will refer to dermatology for possible bx of these growths.  Follow up one month.  - Ambulatory referral to Dermatology  2. End-stage renal disease on hemodialysis ([place]) - Stable No issues with dialysis.  Access working appropriately.   3. Type 2 diabetes mellitus with other circulatory complication, unspecified long term insulin use status ([facility]) - Stable Encouraged good control as its slows the progression of atherosclerotic disease and hinders wound healing.   Current Outpatient Prescriptions on File Prior to Visit  Medication Sig Dispense Refill  . acidophilus (RISAQUAD) CAPS capsule Take 1 capsule by mouth daily.    Marland Kitchen albuterol (PROVENTIL HFA;VENTOLIN HFA) 108 (90 Base) MCG/ACT inhaler Inhale 2 puffs into the lungs every 4 (four) hours as needed for shortness of breath.     Marland Kitchen albuterol (PROVENTIL) (2.5 MG/3ML) 0.083% nebulizer solution Take 2.5 mg by nebulization every 4 (four) hours as needed for wheezing or shortness of breath.    Marland Kitchen amLODipine (NORVASC) 10 MG tablet Take 10 mg by mouth daily.    Marland Kitchen atorvastatin (LIPITOR) 20 MG tablet Take 20 mg by mouth at bedtime.     . cetirizine (ZYRTEC) 10 MG tablet Take 10 mg by mouth daily.     . diclofenac sodium (VOLTAREN) 1 % GEL Apply 2 g topically 4 (four) times daily.    Marland Kitchen FLUoxetine (PROZAC) 20 MG capsule Take 1 capsule (20 mg total) by mouth daily. (Patient taking differently: Take 20 mg by mouth at bedtime. ) 30 capsule 0  . glucose 4 GM chewable tablet Chew 3-4 tablets by mouth as needed for low blood sugar.    . hydrALAZINE (APRESOLINE) 25 MG tablet Take 25 mg by mouth 3 (three) times daily.    Marland Kitchen HYDROcodone-acetaminophen (NORCO/VICODIN) 5-325 MG tablet Take 1 tablet by mouth every 6 (six) hours as needed for moderate pain or severe pain. 15 tablet 0  . hydrOXYzine (ATARAX/VISTARIL) 25 MG tablet Take 25 mg by mouth every 6 (six) hours as needed for itching.    . insulin aspart (NOVOLOG) 100 UNIT/ML injection Inject 0-6 Units into the skin 3 (three) times daily before meals. Per sliding scale    . insulin glargine (LANTUS) 100 UNIT/ML injection Inject 8 Units into the skin at bedtime.    . lamoTRIgine (LAMICTAL) 25 MG tablet Take 25 mg by  mouth at bedtime.    . lipase/protease/amylase (CREON) 12000 UNITS CPEP capsule Take 36,000 Units by mouth 2 (two) times daily.     Marland Kitchen lubiprostone (AMITIZA) 24 MCG capsule Take 24  mcg by mouth daily.    . meclizine (ANTIVERT) 12.5 MG tablet Take 1 tablet (12.5 mg total) by mouth 3 (three) times daily as needed for dizziness. (Patient taking differently: Take 12.5 mg by mouth every 8 (eight) hours as needed for dizziness. ) 30 tablet 0  . multivitamin (RENA-VIT) TABS tablet Take 1 tablet by mouth every evening.     Marland Kitchen OLANZapine (ZYPREXA) 5 MG tablet Take 0.5 tablets (2.5 mg total) by mouth at bedtime. (Patient taking differently: Take 5 mg by mouth at bedtime. ) 30 tablet 0  . omega-3 acid ethyl esters (LOVAZA) 1 g capsule Take 1 g by mouth at bedtime.    . pantoprazole (PROTONIX) 40 MG tablet Take 1 tablet (40 mg total) by mouth 2 (two) times daily before a meal. 60 tablet 2  . pregabalin (LYRICA) 25 MG capsule Take 25 mg by mouth daily.    . promethazine (PHENERGAN) 25 MG suppository Place 1 suppository (25 mg total) rectally every 6 (six) hours as needed for nausea. 12 suppository 0  . promethazine (PHENERGAN) 25 MG suppository Place 1 suppository (25 mg total) rectally every 6 (six) hours as needed for nausea. Stop taking the oral promethazine (phenergan).  Use these suppositories instead. 12 suppository 0  . ranitidine (ZANTAC) 150 MG tablet Take 150 mg by mouth daily. In the afternoon    . rifaximin (XIFAXAN) 550 MG TABS tablet Take 550 mg by mouth 2 (two) times daily.    . sevelamer carbonate (RENVELA) 800 MG tablet Take 2,400 mg by mouth 3 (three) times daily with meals.     . vitamin B-12 (CYANOCOBALAMIN) 500 MCG tablet Take 1,000 mcg by mouth daily.     No current facility-administered medications on file prior to visit.    There are no Patient Instructions on file for this visit. Return in about 1 month (around 09/26/2016) for Lymphedema Check.  Larie Mathes A Fletcher Rathbun, PA-C

## 2016-08-27 IMAGING — CT CT ABD-PELV W/O CM
2 of 4 series · 16 of 46 positions shown, 18 images · non-contrast
Comparison: CT scan of March 15, 2016.

CLINICAL DATA: Right-sided abdominal pain.

EXAM:
CT ABDOMEN AND PELVIS WITHOUT CONTRAST
TECHNIQUE: Multidetector CT imaging of the abdomen and pelvis was performed
following the standard protocol without IV contrast.

[Series 2: axial st · axial · 0.75mm/px · z∈[-1062,-667]mm · 13 of 87 slices shown, 15 images]
[im 4/87  soft-tissue]
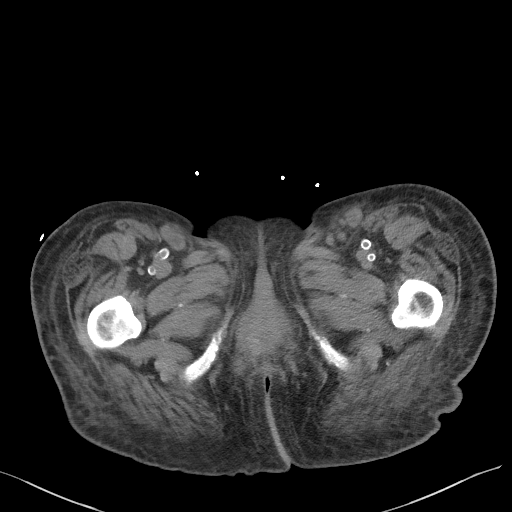
[im 4/87  bone]
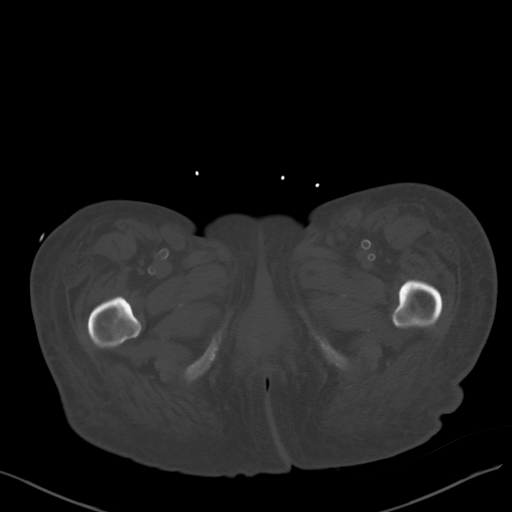
[im 11/87  soft-tissue]
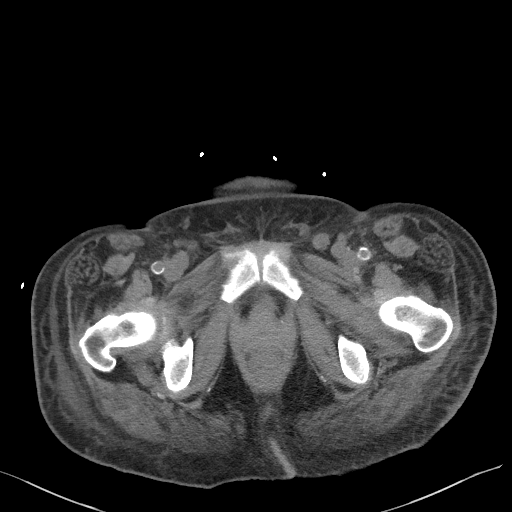
[im 18/87  soft-tissue]
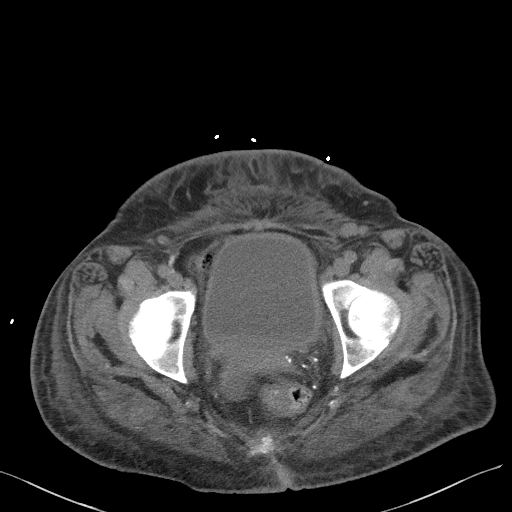
[im 25/87  soft-tissue]
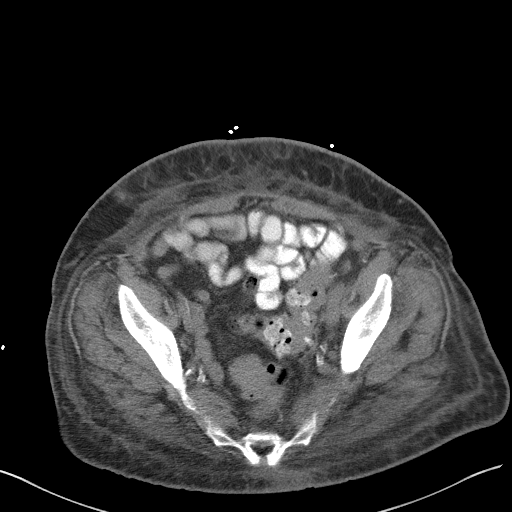
[im 31/87  soft-tissue]
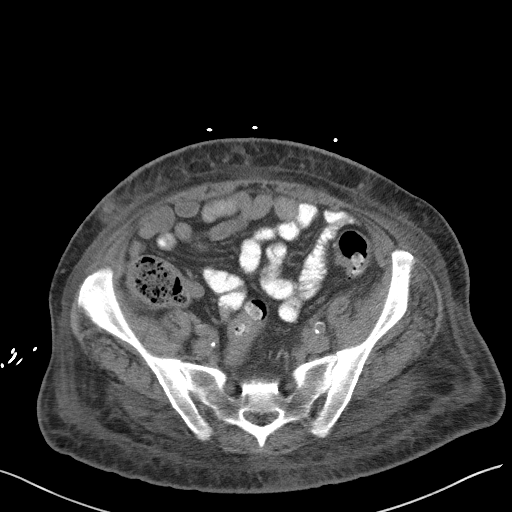
[im 38/87  soft-tissue]
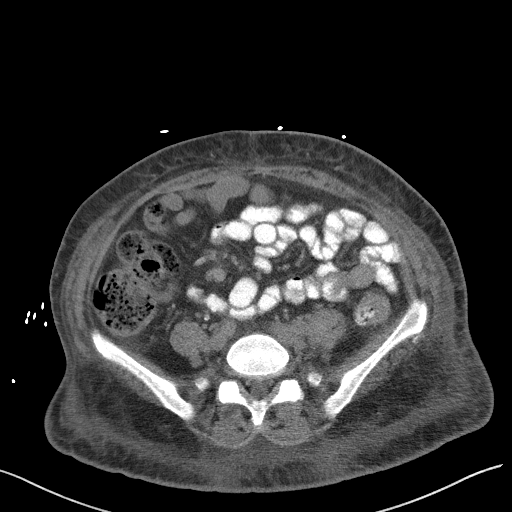
[im 45/87  soft-tissue]
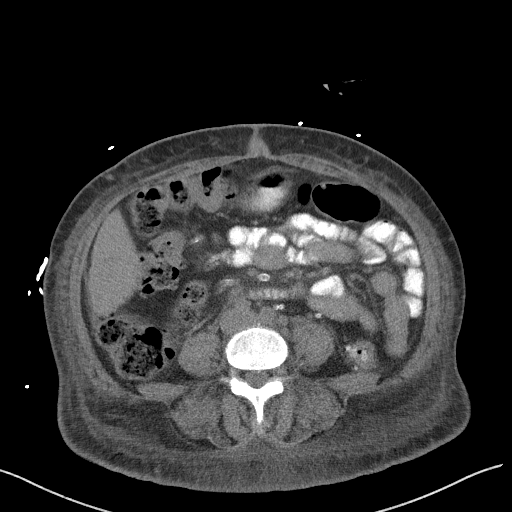
[im 49/87  soft-tissue]
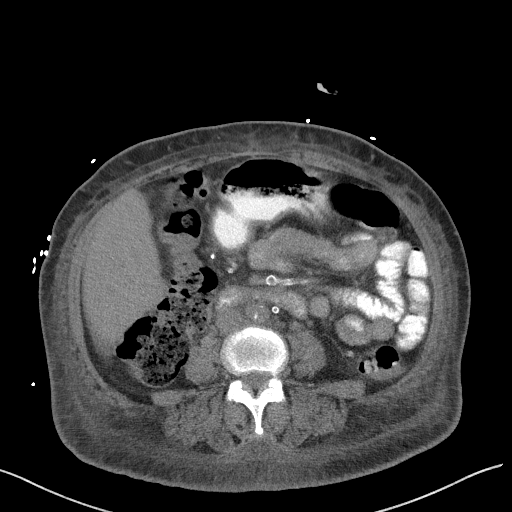
[im 56/87  soft-tissue]
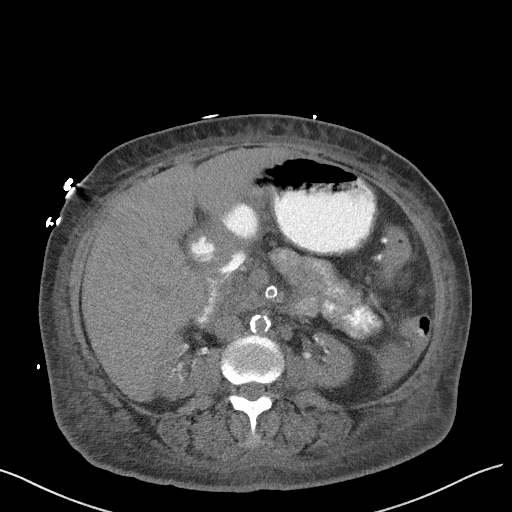
[im 56/87  bone]
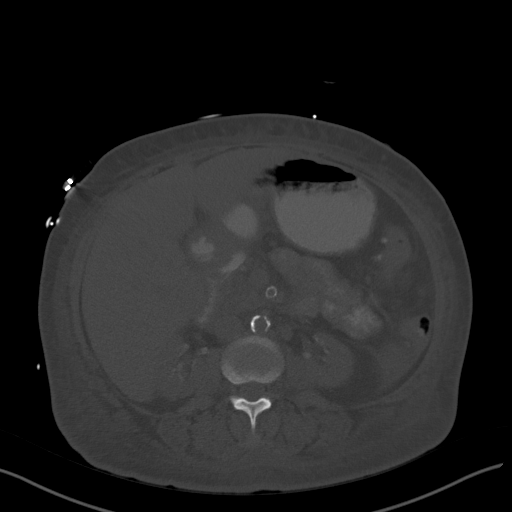
[im 62/87  soft-tissue]
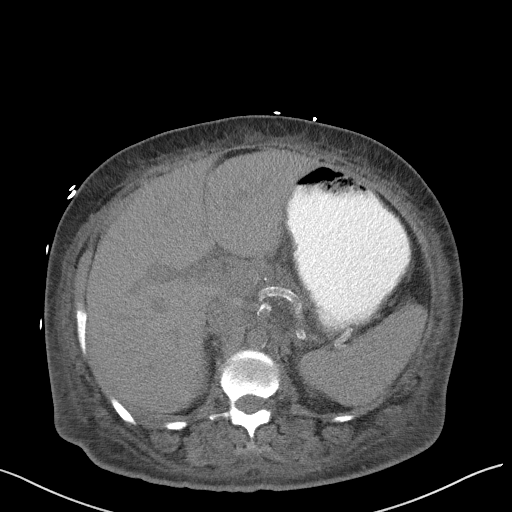
[im 69/87  soft-tissue]
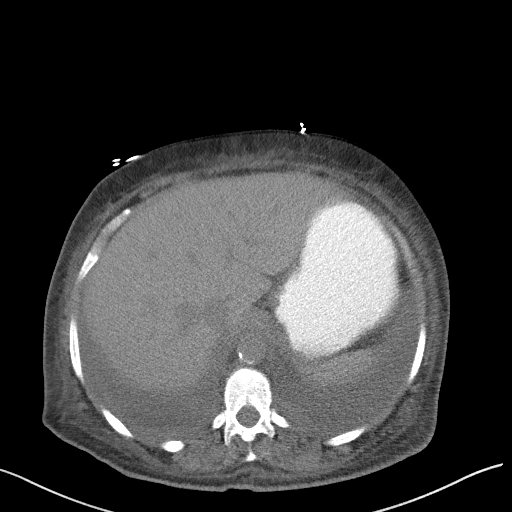
[im 76/87  soft-tissue]
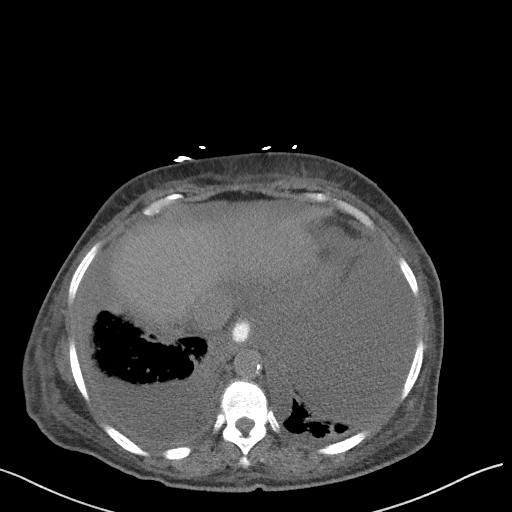
[im 83/87  soft-tissue]
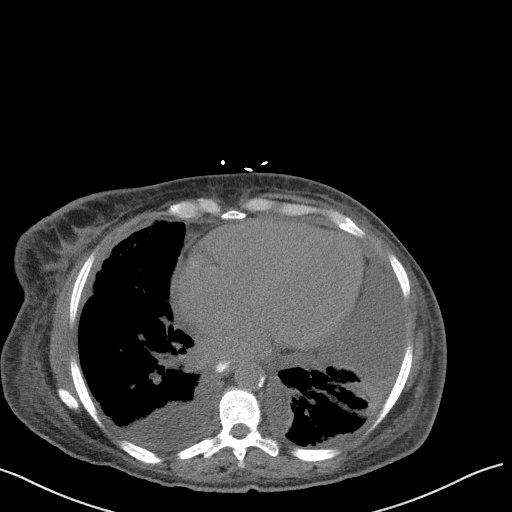

[Series 5: coronal st · coronal · 0.65mm/px · 3 of 101 slices shown]
[im 34/101  soft-tissue]
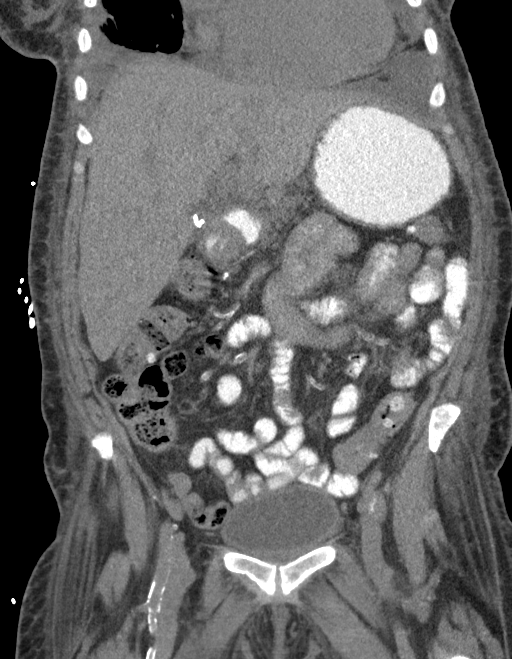
[im 45/101  soft-tissue]
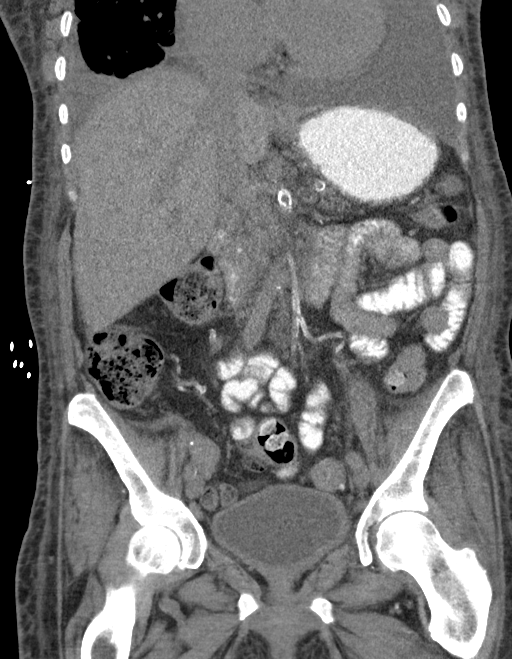
[im 56/101  soft-tissue]
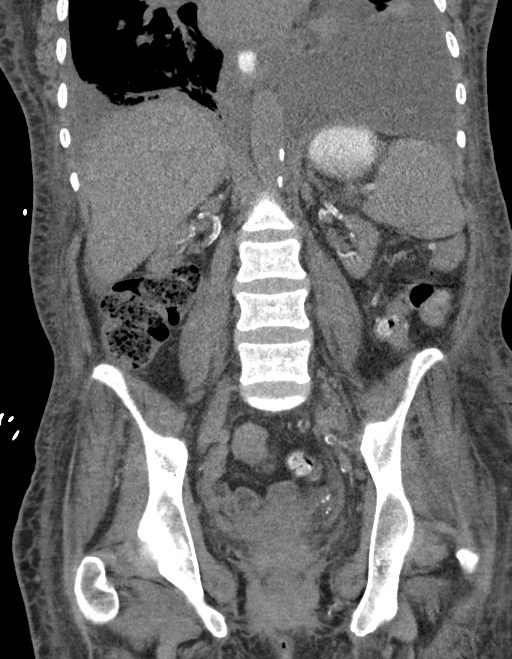

[16 of 46 positions shown; findings below may reference images not displayed]

FINDINGS: There is continued presence of loculated pleural effusions in both
visualized lung bases. No significant osseous abnormality is noted.

Status post cholecystectomy. No focal abnormality is noted in the
liver, spleen or pancreas on these unenhanced images. Adrenal glands
are unremarkable. Bilateral renal atrophy is noted. Extensive
atherosclerotic calcifications are seen involving the abdominal
aorta and mesenteric and renal arteries. There is no evidence of
bowel obstruction. Diverticulosis of sigmoid colon is noted without
inflammation. No abnormal fluid collection is noted. Urinary bladder
appears normal. Bilateral inguinal adenopathy is noted which appears
to be slightly enlarged compared to prior exam, with the largest
lymph nodes measuring 1.3 cm bilaterally. Moderate anasarca is
noted.
IMPRESSION: Continued presence of loculated pleural effusion in both lung bases.

Bilateral renal atrophy.

Aortic atherosclerosis.

Diverticulosis of sigmoid colon without inflammation.

Moderate anasarca.

Bilateral inguinal adenopathy is noted which is slightly enlarged
compared to prior exam. This most likely is inflammatory or reactive
in etiology, but neoplasm cannot be excluded. Clinical correlation
is recommended.

## 2016-08-27 IMAGING — CT CT HEAD CODE STROKE
3 series · 15 of 46 positions shown, 18 images · non-contrast
Comparison: 04/08/2016

CLINICAL DATA: Right-sided weakness.

EXAM:
CT HEAD WITHOUT CONTRAST
TECHNIQUE: Contiguous axial images were obtained from the base of the skull
through the vertex without intravenous contrast.

[Series 2: head wo · axial · 0.42mm/px · z∈[+434,+554]mm · 9 of 29 slices shown, 12 images]
[im 3/29  brain]
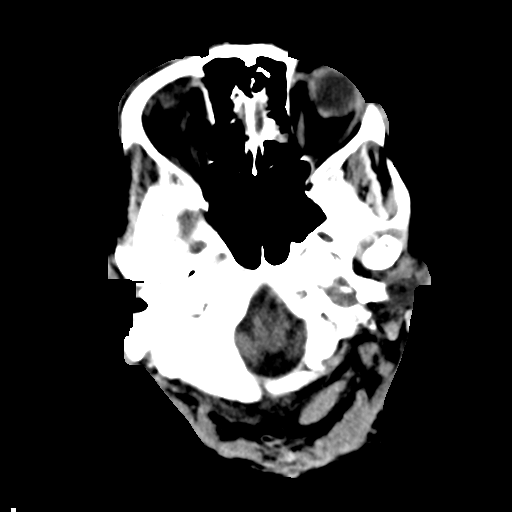
[im 3/29  bone]
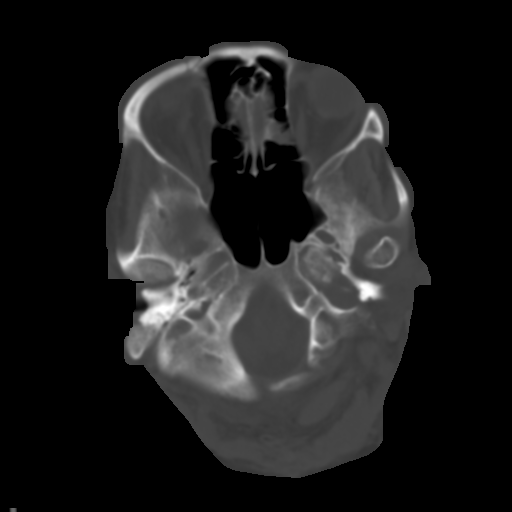
[im 6/29  brain]
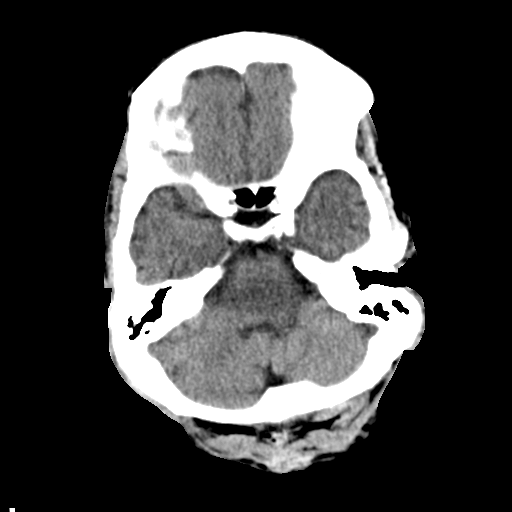
[im 9/29  brain]
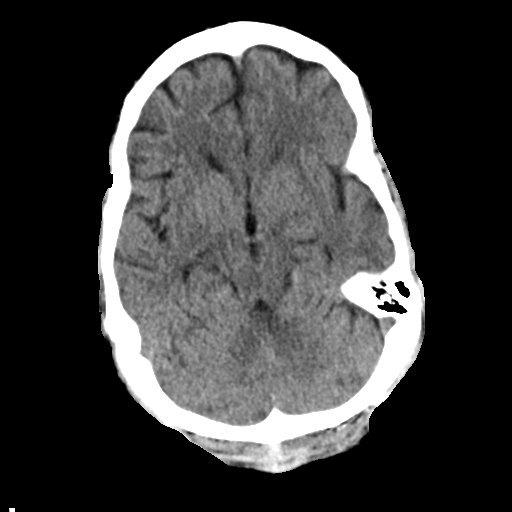
[im 12/29  brain]
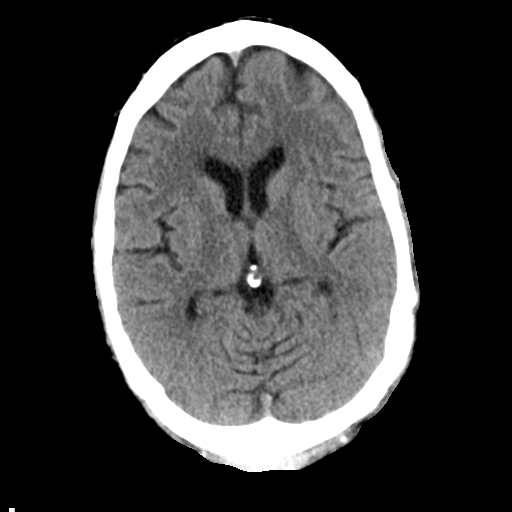
[im 15/29  brain]
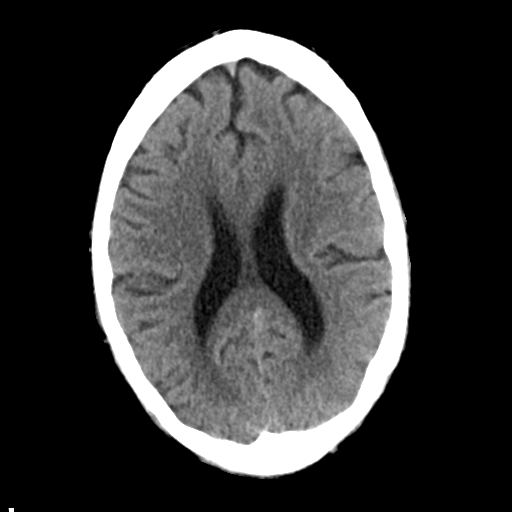
[im 15/29  bone]
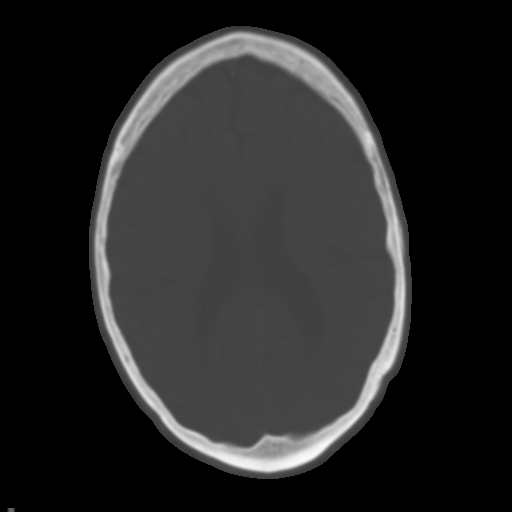
[im 18/29  brain]
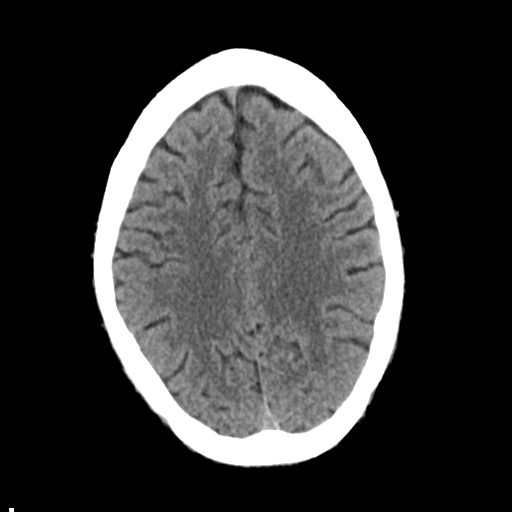
[im 21/29  brain]
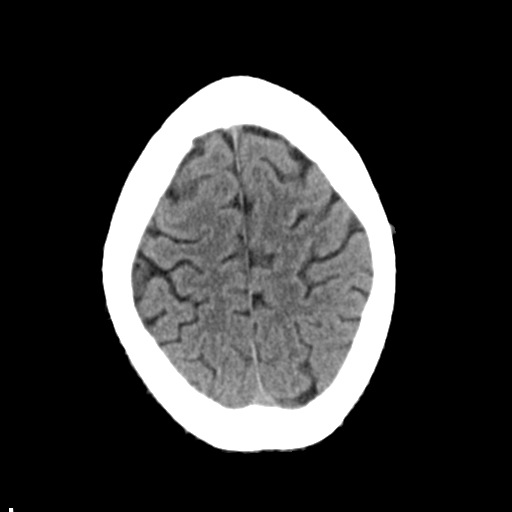
[im 24/29  brain]
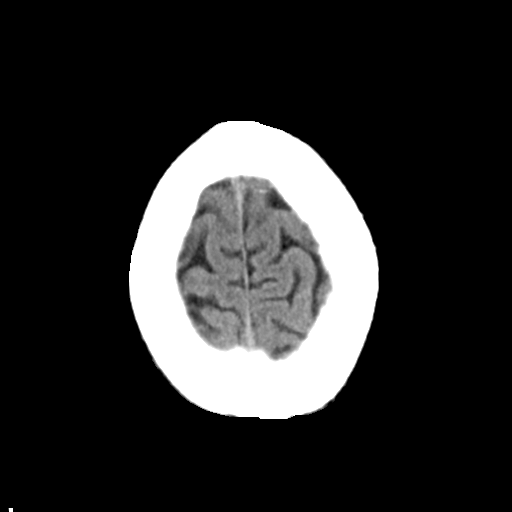
[im 27/29  brain]
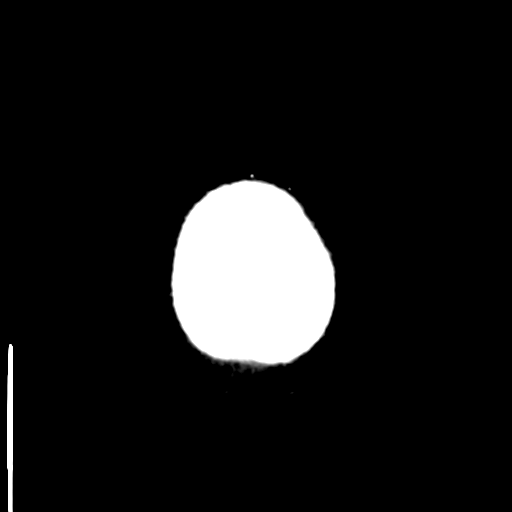
[im 27/29  bone]
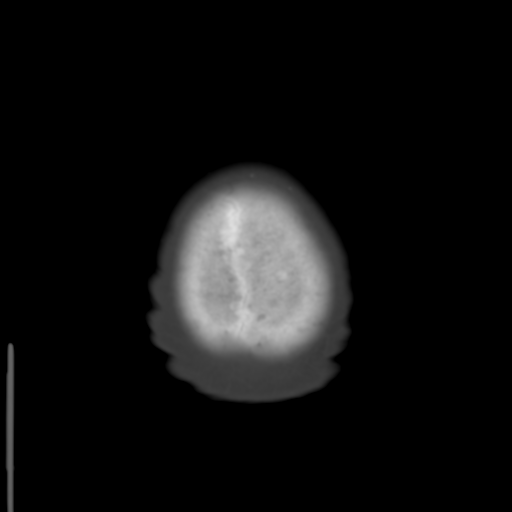

[Series 4: coronal soft tissue · coronal · 0.28mm/px · 3 of 66 slices shown]
[im 22/66  brain]
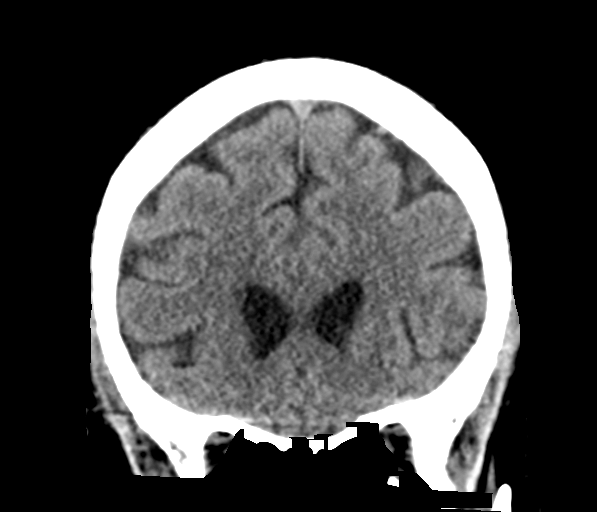
[im 29/66  brain]
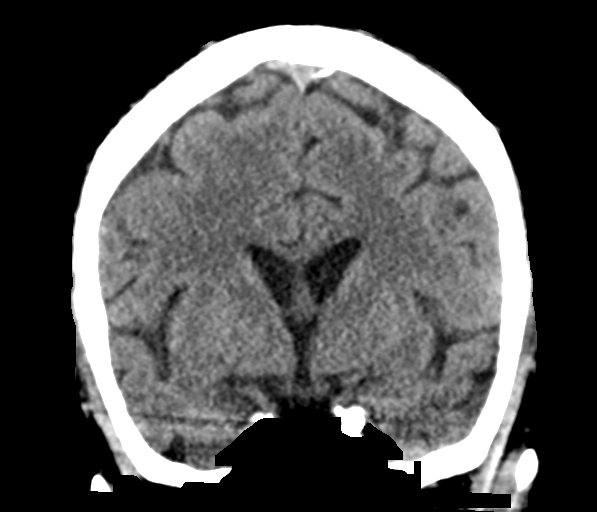
[im 37/66  brain]
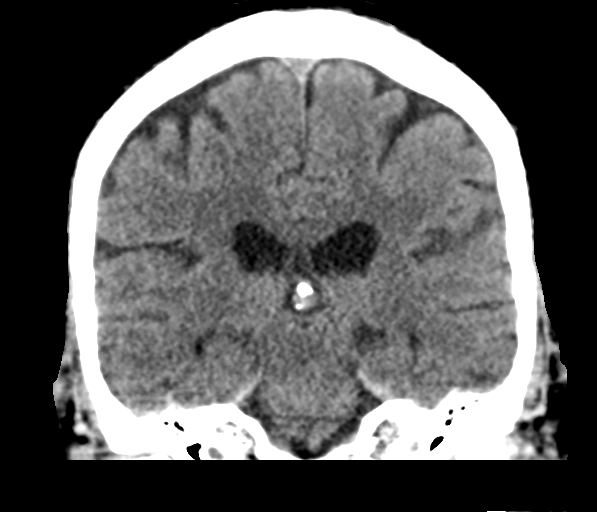

[Series 5: sagittal soft tissue · sagittal · 0.30mm/px · 3 of 48 slices shown]
[im 16/48  brain]
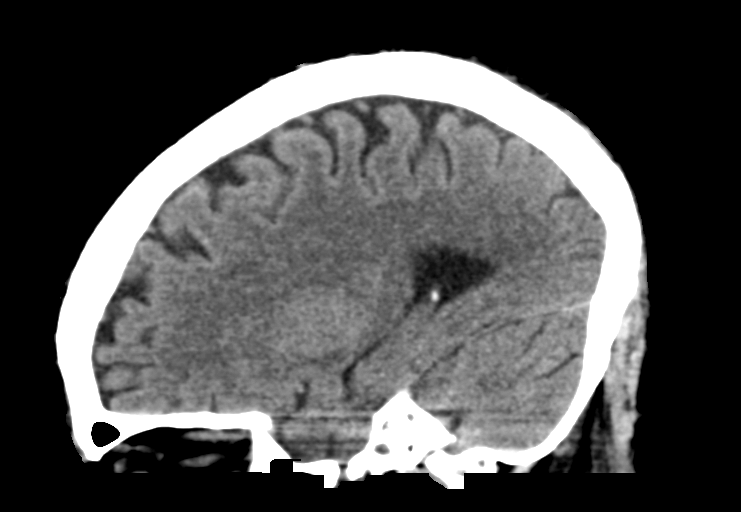
[im 24/48  brain]
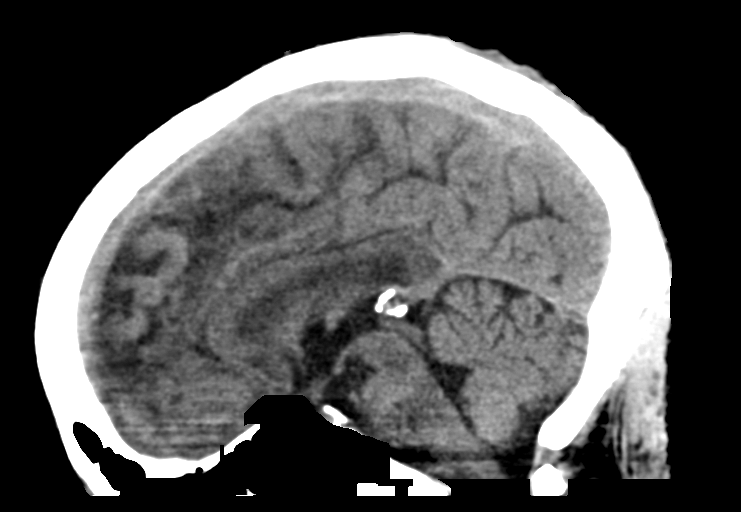
[im 32/48  brain]
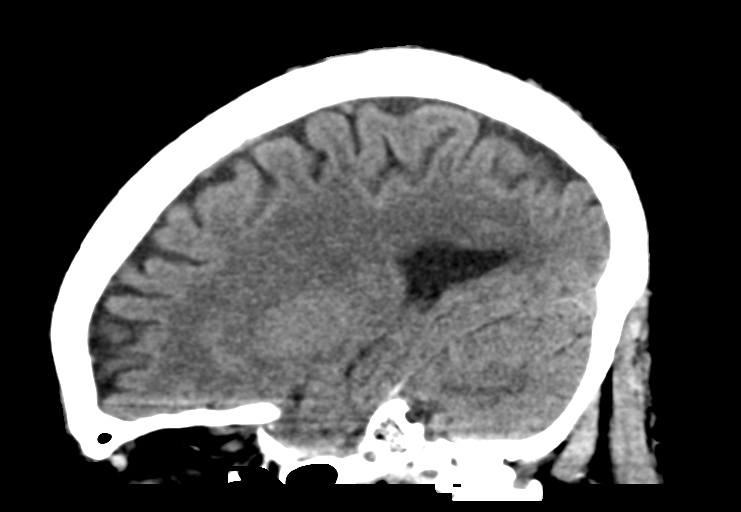

[15 of 46 positions shown; findings below may reference images not displayed]

FINDINGS: Normal appearance of the brain. No evidence of acute infarct,
hemorrhage, hydrocephalus, or mass lesion. No hyperdense vessel.
Calcified atherosclerosis in the carotid and vertebral circulation.

Negative skull and visualized orbits. Clear visualized paranasal
sinuses.

ASPECTS (Alberta Stroke Program Early CT Score,
[URL]

- Ganglionic level infarction (caudate, lentiform nuclei, internal
capsule, insula, M1-M3 cortex): 7

- Supraganglionic infarction (M4-M6 cortex): 3

Total score (0-10): 10

These results were called by telephone at the time of interpretation
on 04/21/2016 at [DATE] to Dr. NELLY KOCI SEHIT , who verbally
acknowledged these results.
IMPRESSION: 1. Negative for hemorrhage or visible infarct.
2. ASPECTS score 10.

## 2016-08-28 IMAGING — MR MR HEAD W/O CM
10 of 11 series · 34 of 48 positions shown · non-contrast
Comparison: CT 04/21/2016.  MRI 12/04/2009.

CLINICAL DATA: End-stage renal disease patient. Chronic
encephalopathy. Nausea, vomiting and right-sided weakness.

EXAM:
MRI HEAD WITHOUT CONTRAST
MRA HEAD WITHOUT CONTRAST
TECHNIQUE: Multiplanar, multiecho pulse sequences of the brain and surrounding
structures were obtained without intravenous contrast. Angiographic
images of the head were obtained using MRA technique without
contrast.

[Series 2: GRE · sagittal · 5.0mm · 0.45mm/px · 3 of 27 slices shown (1 of 2)]
[im 1/27]
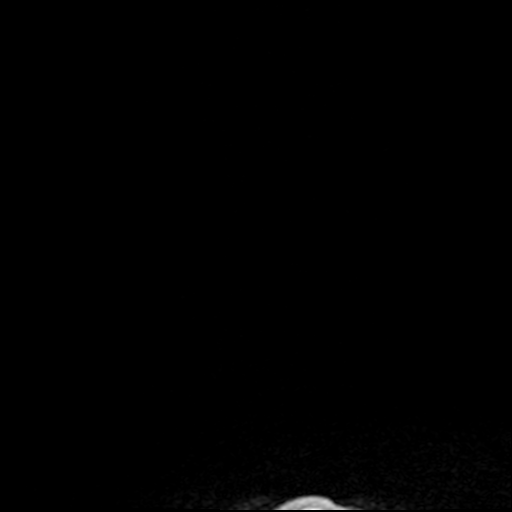
[im 14/27]
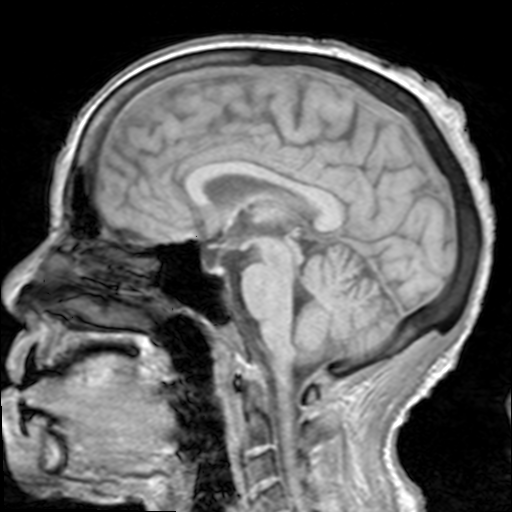
[im 27/27]
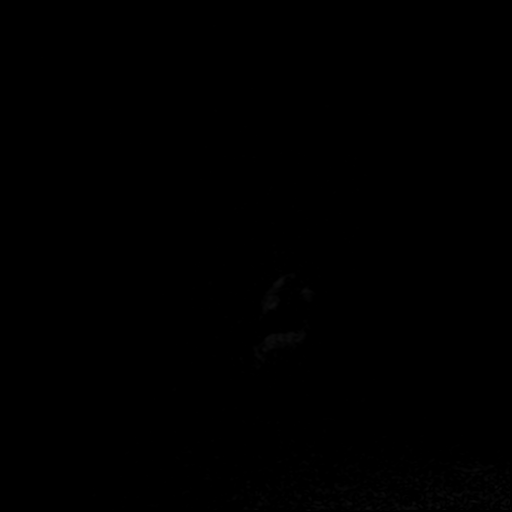

[Series 4: DWI · axial · 3.0mm · 1.80mm/px · z∈[-66,+91]mm · 5 of 54 slices shown (1 of 2)]
[im 1/54]
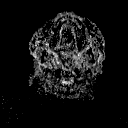
[im 14/54]
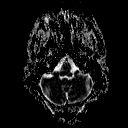
[im 27/54]
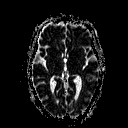
[im 40/54]
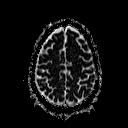
[im 54/54]
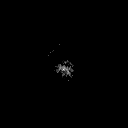

[Series 6: DWI · coronal · 3.0mm · 1.80mm/px · 5 of 49 slices shown (2 of 2)]
[im 1/49]
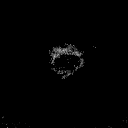
[im 13/49]
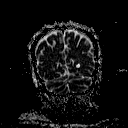
[im 25/49]
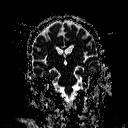
[im 37/49]
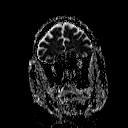
[im 49/49]
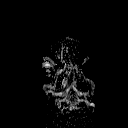

[Series 7: T2 · axial · 5.0mm · 0.45mm/px · z∈[-62,+89]mm · 2 of 25 slices shown (1 of 3)]
[im 1/25]
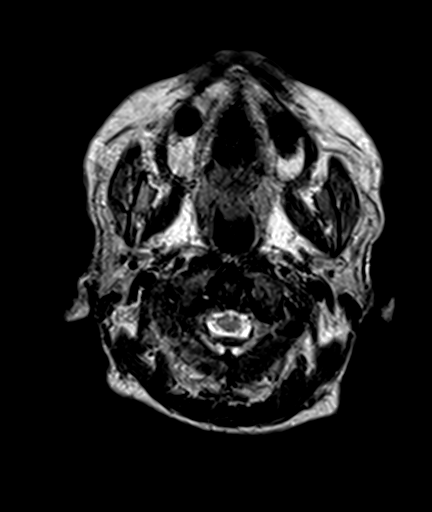
[im 25/25]
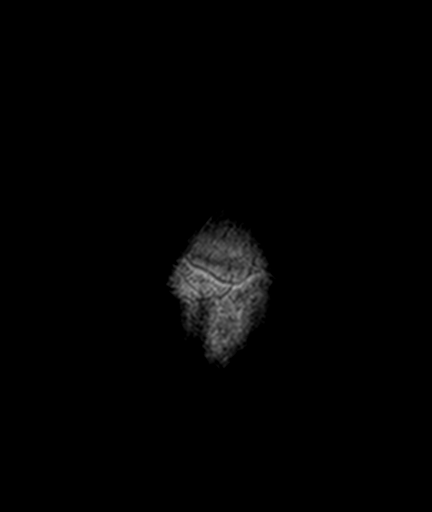

[Series 8: FLAIR · axial · 5.0mm · 0.45mm/px · z∈[-61,+89]mm · 2 of 25 slices shown]
[im 1/25]
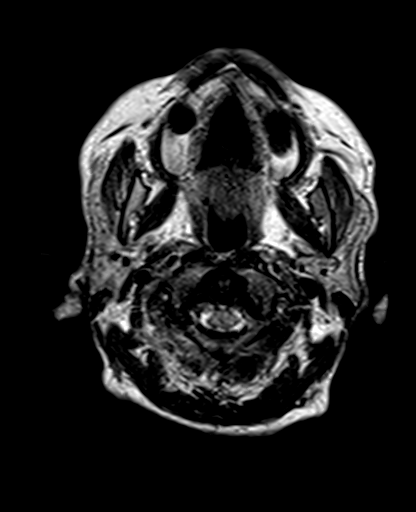
[im 25/25]
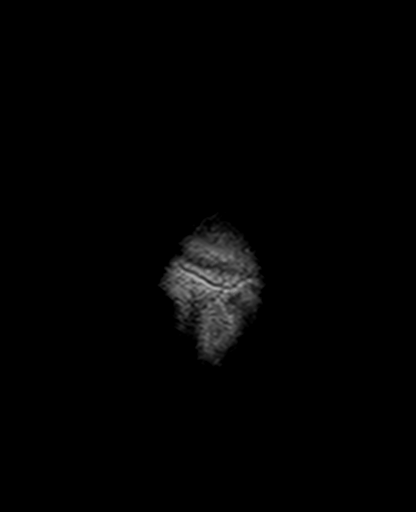

[Series 9: T2 · axial · 5.0mm · 1.20mm/px · z∈[-63,+88]mm · 2 of 25 slices shown (2 of 3)]
[im 1/25]
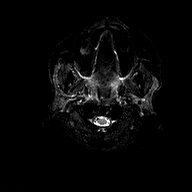
[im 25/25]
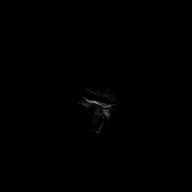

[Series 10: GRE · axial · 5.0mm · 0.45mm/px · z∈[-63,+88]mm · 2 of 25 slices shown (2 of 2)]
[im 1/25]
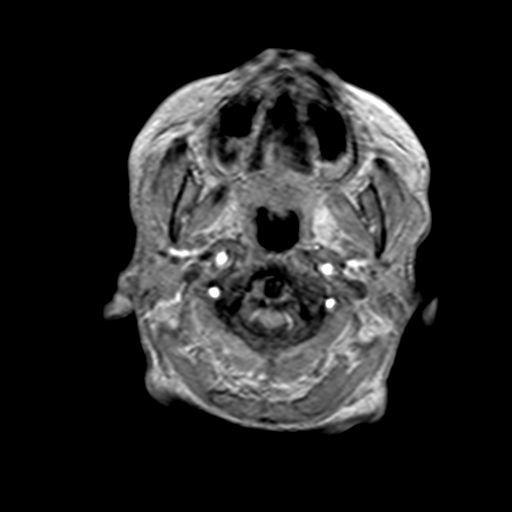
[im 25/25]
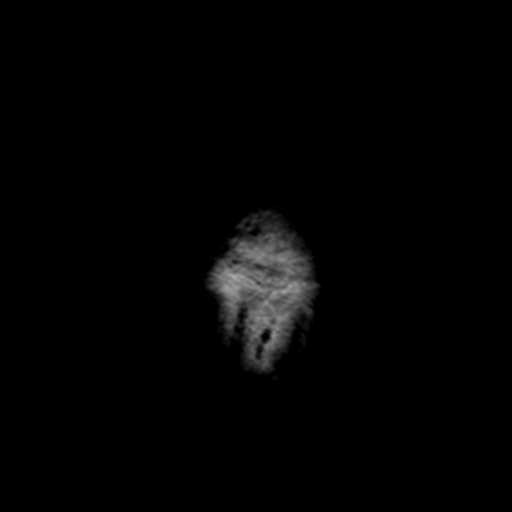

[Series 12: T2 · coronal · 5.0mm · 0.45mm/px · 3 of 27 slices shown (3 of 3)]
[im 1/27]
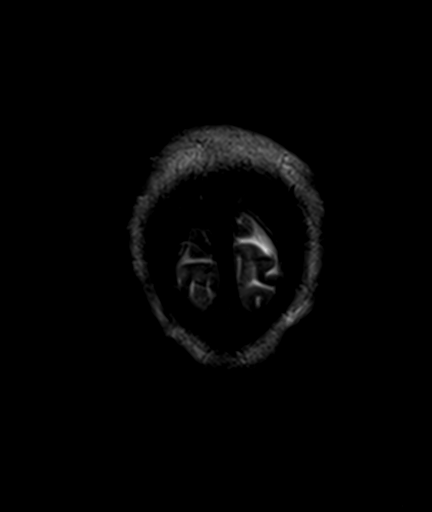
[im 14/27]
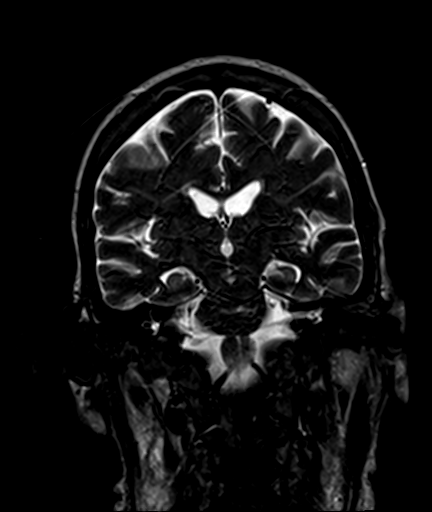
[im 27/27]
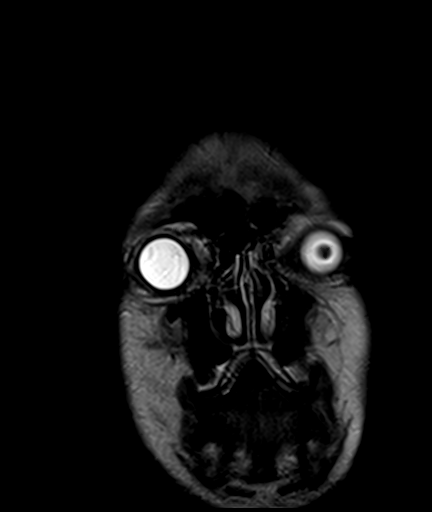

[Series 13: TOF · axial · non-contrast · 0.7mm · 0.37mm/px · z∈[-56,+40]mm · 8 of 143 slices shown]
[im 1/143]
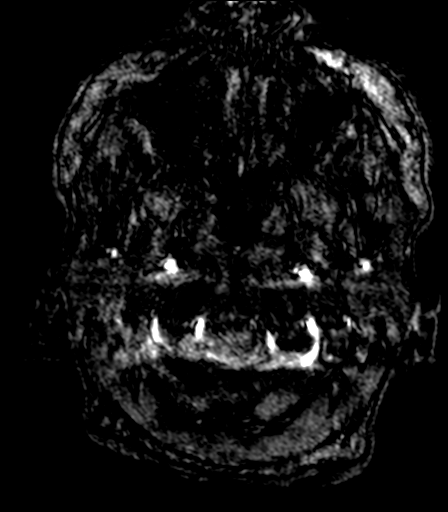
[im 22/143]
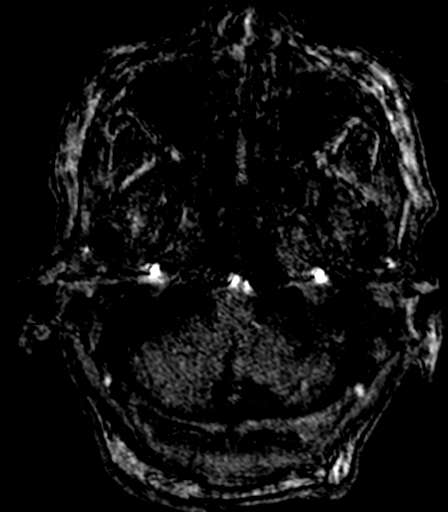
[im 44/143]
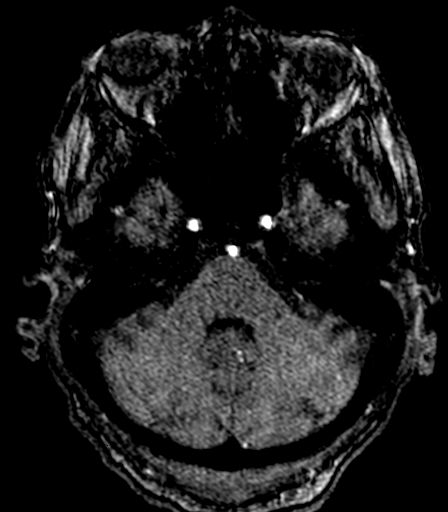
[im 66/143]
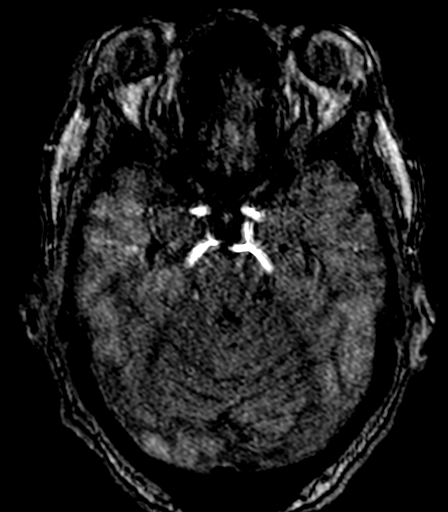
[im 77/143]
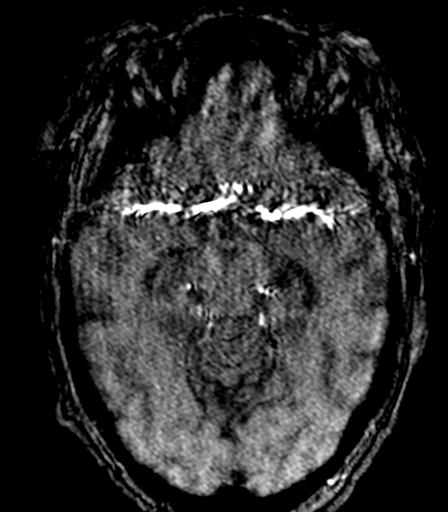
[im 99/143]
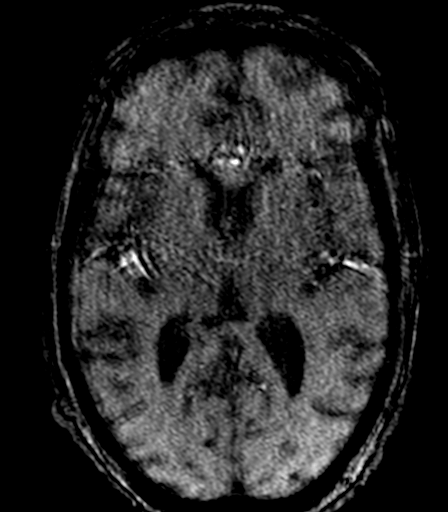
[im 121/143]
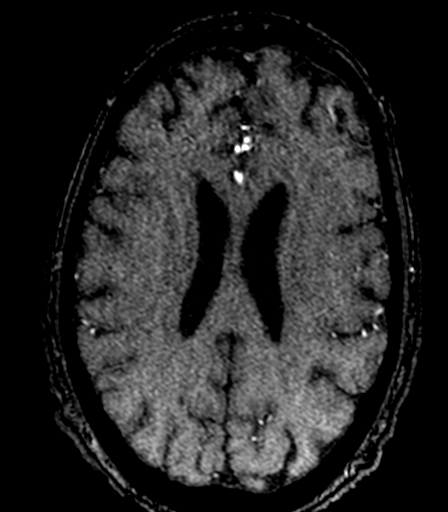
[im 143/143]
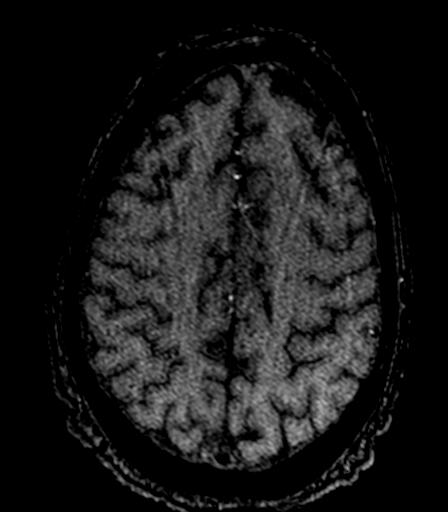

[Series 100: (id) · axial · 3.0mm · 1.80mm/px · z∈[-66,-28]mm · 2 of 55 slices shown]
[im 1/55]
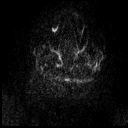
[im 14/55]
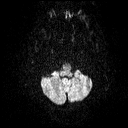

[34 of 48 positions shown; findings below may reference images not displayed]

FINDINGS: MRI HEAD FINDINGS

Diffusion imaging does not show any acute or subacute infarction.
There are old small vessel infarctions affecting the pons. No
cerebellar insult. Cerebral hemispheres show mild chronic
small-vessel ischemic change of the deep white matter. No cortical
or large vessel territory infarction. No mass lesion, hemorrhage,
hydrocephalus or extra-axial collection. No pituitary mass. No
inflammatory sinus disease.

MRA HEAD FINDINGS

Motion degraded exam. Both internal carotid arteries are patent into
the brain. Siphon regions are not well evaluated because of motion.
The anterior and middle cerebral vessels show flow. Both vertebral
arteries are patent to the basilar. The basilar is patent. Major
posterior circulation branch vessels are patent.
IMPRESSION: MRI head: No acute finding. Old small vessel infarctions affecting
the pons. Minimal small vessel change affecting the cerebral
hemispheric white matter.

MRA head: Severely motion degraded study. No major vessel occlusion.

## 2016-09-01 IMAGING — DX DG CHEST 1V
1 series · 1 of 1 positions shown · non-contrast
Comparison: 04/07/2016

CLINICAL DATA: Short of breath for 2 days

EXAM:
CHEST 1 VIEW

[chest ap]
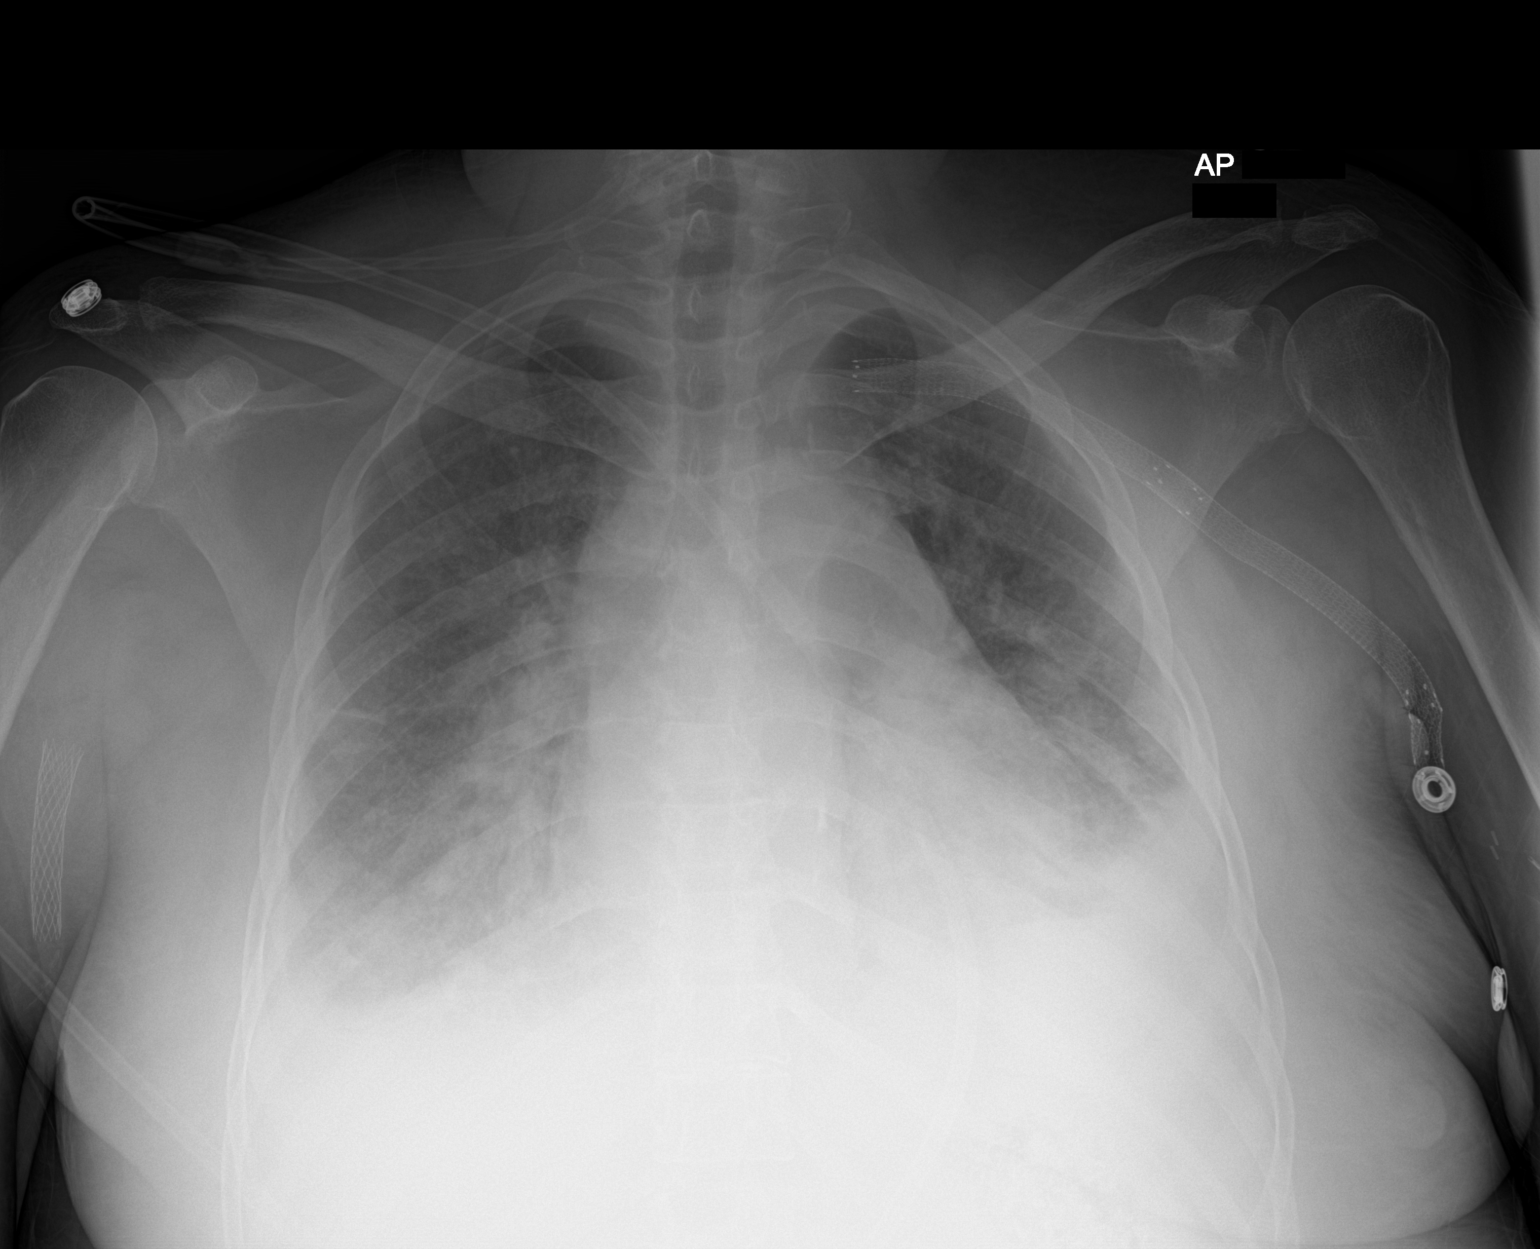

[1 of 1 positions shown; findings below may reference images not displayed]

FINDINGS: Cardiac silhouette is enlarged. There is bilateral pleural effusions
and basilar atelectasis. Mild airspace disease at the lung bases.
Upper lungs are relatively clear. Vascular stent noted in the LEFT
axilla
IMPRESSION: 1. No significant change.
2. Bibasilar effusions and airspace disease representing edema
versus pneumonia.

## 2016-09-02 ENCOUNTER — Encounter (INDEPENDENT_AMBULATORY_CARE_PROVIDER_SITE_OTHER): Payer: Self-pay

## 2016-09-02 ENCOUNTER — Encounter (INDEPENDENT_AMBULATORY_CARE_PROVIDER_SITE_OTHER): Payer: Medicare Other

## 2016-09-02 ENCOUNTER — Ambulatory Visit (INDEPENDENT_AMBULATORY_CARE_PROVIDER_SITE_OTHER): Payer: Medicare Other | Admitting: Vascular Surgery

## 2016-09-02 VITALS — BP 155/65 | HR 77 | Resp 16 | Wt 162.0 lb

## 2016-09-02 DIAGNOSIS — I890 Lymphedema, not elsewhere classified: Secondary | ICD-10-CM

## 2016-09-02 NOTE — Progress Notes (Signed)
History of Present Illness  There is no documented history at this time  Assessments & Plan   There are no diagnoses linked to this encounter.    Additional instructions  Subjective:  Patient presents with venous ulcer of the Bilateral lower extremity.    Procedure:  3 layer unna wrap was placed Bilateral lower extremity.   Plan:   Follow up in one week.  

## 2016-09-03 IMAGING — DX DG CHEST 1V
1 series · 1 of 1 positions shown · non-contrast
Comparison: 04/26/2016

CLINICAL DATA: Pt admitted [DATE] with intermittent nausea and
vomiting, shortness of breath; h/o chf, copd.

EXAM:
CHEST 1 VIEW

[chest ap]
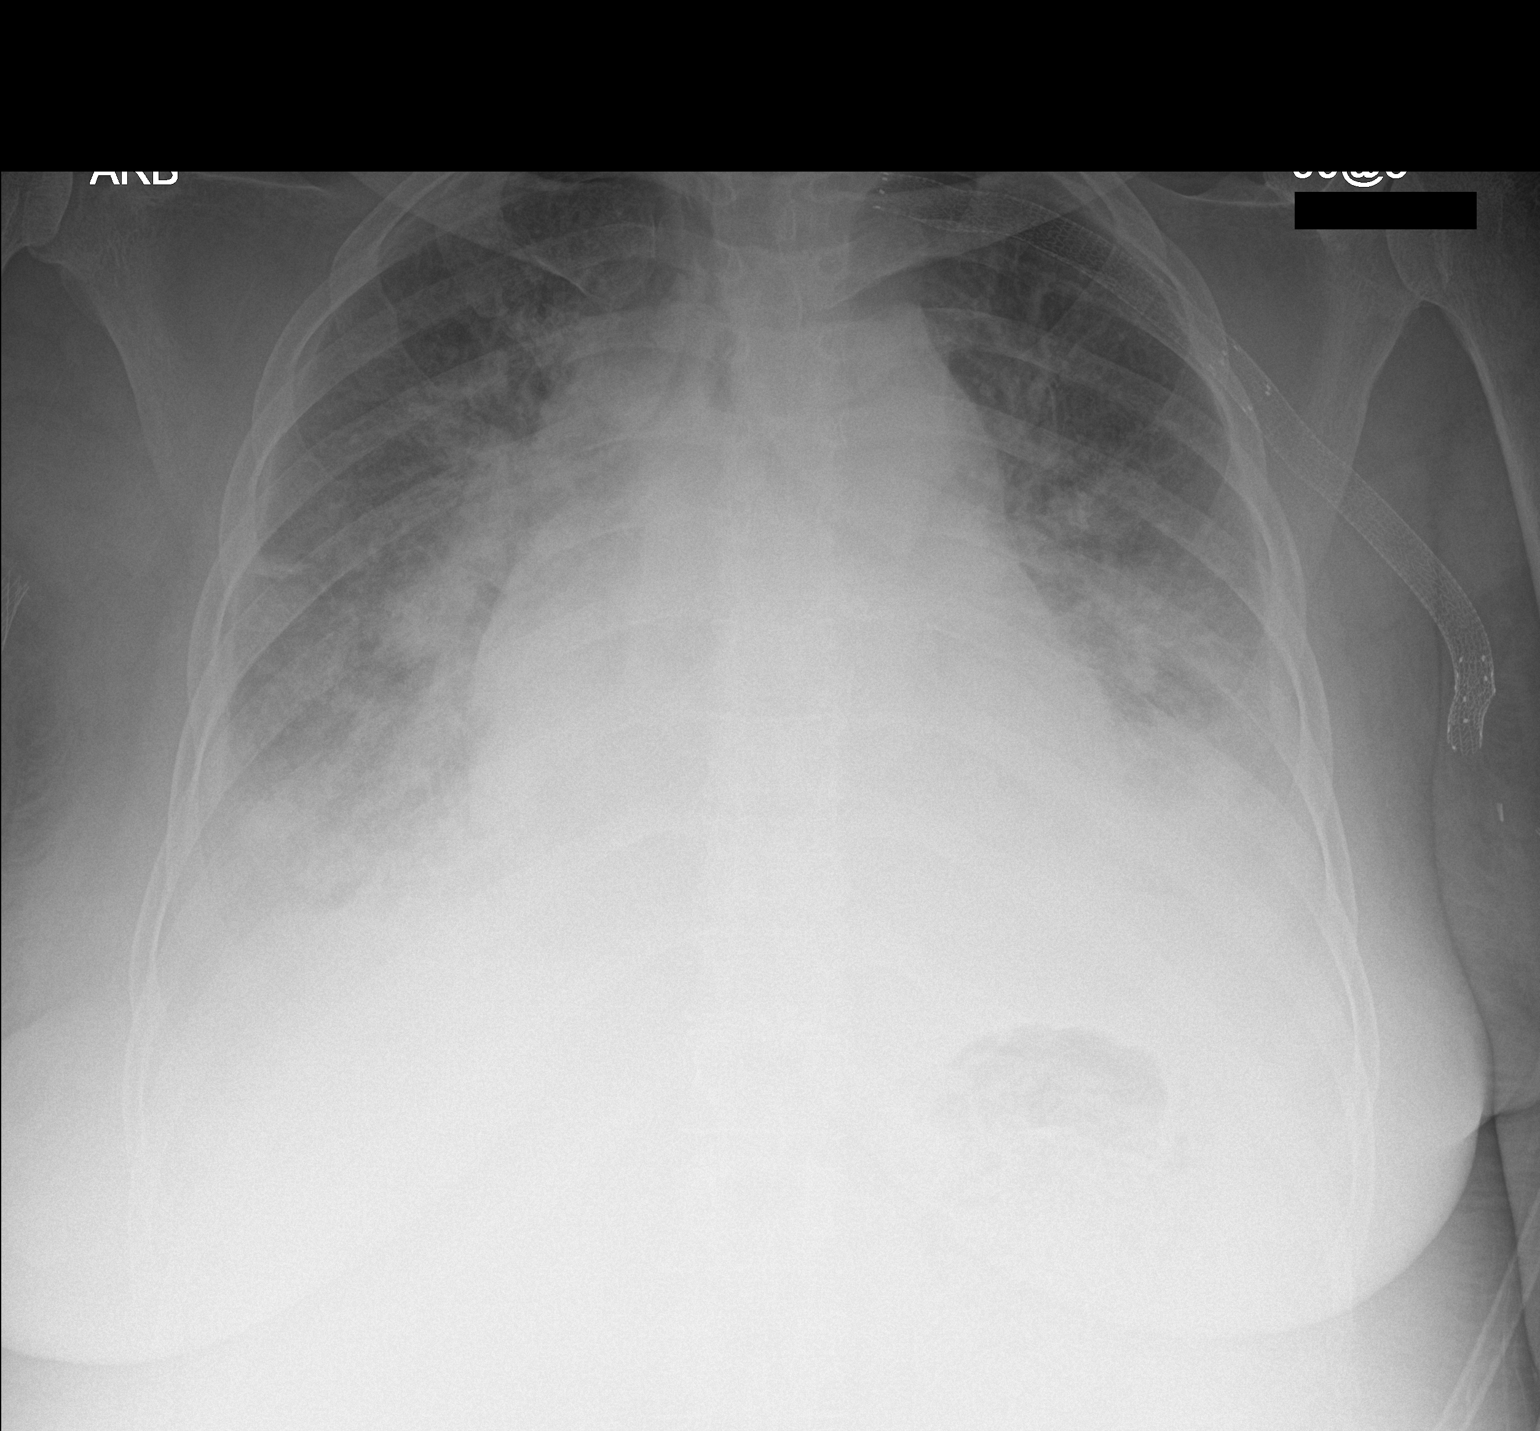

[1 of 1 positions shown; findings below may reference images not displayed]

FINDINGS: Cardiac silhouette is enlarged. There is central vascular congestion
as well as bilateral interstitial thickening and hazy perihilar and
more confluent lung base opacity. There are bilateral pleural
effusions, mildly increased from the prior study.

No pneumothorax.

Left sided vascular stent is stable.
IMPRESSION: 1. Mild worsening of probable congestive heart failure. Central
vascular congestion and interstitial thickening appears mildly
increased from the previous exam. Pleural effusions have also mildly
increased.

## 2016-09-09 ENCOUNTER — Ambulatory Visit (INDEPENDENT_AMBULATORY_CARE_PROVIDER_SITE_OTHER): Payer: Medicare Other | Admitting: Vascular Surgery

## 2016-09-09 ENCOUNTER — Encounter (INDEPENDENT_AMBULATORY_CARE_PROVIDER_SITE_OTHER): Payer: Self-pay | Admitting: Vascular Surgery

## 2016-09-09 VITALS — BP 126/57 | HR 72 | Resp 16 | Wt 164.0 lb

## 2016-09-09 DIAGNOSIS — I890 Lymphedema, not elsewhere classified: Secondary | ICD-10-CM | POA: Diagnosis not present

## 2016-09-09 NOTE — Progress Notes (Signed)
History of Present Illness  There is no documented history at this time  Assessments & Plan   There are no diagnoses linked to this encounter.    Additional instructions  Subjective:  Patient presents with venous ulcer of the Bilateral lower extremity.    Procedure:  3 layer unna wrap was placed Bilateral lower extremity.   Plan:   Follow up in one week.  

## 2016-09-11 ENCOUNTER — Encounter (INDEPENDENT_AMBULATORY_CARE_PROVIDER_SITE_OTHER): Payer: Self-pay

## 2016-09-18 ENCOUNTER — Encounter (INDEPENDENT_AMBULATORY_CARE_PROVIDER_SITE_OTHER): Payer: Medicare Other

## 2016-09-18 ENCOUNTER — Ambulatory Visit (INDEPENDENT_AMBULATORY_CARE_PROVIDER_SITE_OTHER): Payer: Medicare Other

## 2016-09-18 ENCOUNTER — Ambulatory Visit (INDEPENDENT_AMBULATORY_CARE_PROVIDER_SITE_OTHER): Payer: Medicare Other | Admitting: Vascular Surgery

## 2016-09-18 ENCOUNTER — Encounter (INDEPENDENT_AMBULATORY_CARE_PROVIDER_SITE_OTHER): Payer: Self-pay | Admitting: Vascular Surgery

## 2016-09-18 ENCOUNTER — Other Ambulatory Visit (INDEPENDENT_AMBULATORY_CARE_PROVIDER_SITE_OTHER): Payer: Self-pay | Admitting: Vascular Surgery

## 2016-09-18 ENCOUNTER — Encounter (INDEPENDENT_AMBULATORY_CARE_PROVIDER_SITE_OTHER): Payer: Self-pay

## 2016-09-18 VITALS — BP 159/63 | HR 69 | Resp 16 | Wt 162.0 lb

## 2016-09-18 DIAGNOSIS — Z992 Dependence on renal dialysis: Secondary | ICD-10-CM

## 2016-09-18 DIAGNOSIS — I10 Essential (primary) hypertension: Secondary | ICD-10-CM

## 2016-09-18 DIAGNOSIS — N186 End stage renal disease: Secondary | ICD-10-CM

## 2016-09-18 DIAGNOSIS — T829XXA Unspecified complication of cardiac and vascular prosthetic device, implant and graft, initial encounter: Secondary | ICD-10-CM | POA: Diagnosis not present

## 2016-09-18 DIAGNOSIS — I2510 Atherosclerotic heart disease of native coronary artery without angina pectoris: Secondary | ICD-10-CM

## 2016-09-18 DIAGNOSIS — I890 Lymphedema, not elsewhere classified: Secondary | ICD-10-CM

## 2016-09-18 DIAGNOSIS — E1159 Type 2 diabetes mellitus with other circulatory complications: Secondary | ICD-10-CM | POA: Diagnosis not present

## 2016-09-18 DIAGNOSIS — I872 Venous insufficiency (chronic) (peripheral): Secondary | ICD-10-CM

## 2016-09-22 HISTORY — PX: EYE SURGERY: SHX253

## 2016-09-24 NOTE — Progress Notes (Signed)
MRN : [id number]  [name] is a 56 y.o. (Dec 08, 1960) female who presents with chief complaint of  Chief Complaint  Patient presents with  . Follow-up  .  History of Present Illness: The patient returns to the office for followup evaluation regarding leg swelling.  The swelling has improved quite a bit and the pain associated with swelling has decreased substantially. There have not been any interval development of a ulcerations or wounds.  Since the previous visit the patient has been wearing graduated compression stockings and has noted little significant improvement in the lymphedema. The patient has been using compression routinely morning until night.  The patient also states elevation during the day and exercise is being done too.    Current Meds  Medication Sig  . acidophilus (RISAQUAD) CAPS capsule Take 1 capsule by mouth daily.  Marland Kitchen albuterol (PROVENTIL HFA;VENTOLIN HFA) 108 (90 Base) MCG/ACT inhaler Inhale 2 puffs into the lungs every 4 (four) hours as needed for shortness of breath.   Marland Kitchen albuterol (PROVENTIL) (2.5 MG/3ML) 0.083% nebulizer solution Take 2.5 mg by nebulization every 4 (four) hours as needed for wheezing or shortness of breath.  Marland Kitchen amLODipine (NORVASC) 10 MG tablet Take 10 mg by mouth daily.  Marland Kitchen atorvastatin (LIPITOR) 20 MG tablet Take 20 mg by mouth at bedtime.   . cetirizine (ZYRTEC) 10 MG tablet Take 10 mg by mouth daily.   . diclofenac sodium (VOLTAREN) 1 % GEL Apply 2 g topically 4 (four) times daily.  Marland Kitchen FLUoxetine (PROZAC) 20 MG capsule Take 1 capsule (20 mg total) by mouth daily. (Patient taking differently: Take 20 mg by mouth at bedtime. )  . glucose 4 GM chewable tablet Chew 3-4 tablets by mouth as needed for low blood sugar.  . hydrALAZINE (APRESOLINE) 25 MG tablet Take 25 mg by mouth 3 (three) times daily.  Marland Kitchen HYDROcodone-acetaminophen (NORCO/VICODIN) 5-325 MG tablet Take 1 tablet by mouth every 6 (six) hours as needed for moderate pain or severe pain.    . hydrOXYzine (ATARAX/VISTARIL) 25 MG tablet Take 25 mg by mouth every 6 (six) hours as needed for itching.  . insulin aspart (NOVOLOG) 100 UNIT/ML injection Inject 0-6 Units into the skin 3 (three) times daily before meals. Per sliding scale  . insulin glargine (LANTUS) 100 UNIT/ML injection Inject 8 Units into the skin at bedtime.  . lamoTRIgine (LAMICTAL) 25 MG tablet Take 25 mg by mouth at bedtime.  . lipase/protease/amylase (CREON) 12000 UNITS CPEP capsule Take 36,000 Units by mouth 2 (two) times daily.   Marland Kitchen lubiprostone (AMITIZA) 24 MCG capsule Take 24 mcg by mouth daily.  . meclizine (ANTIVERT) 12.5 MG tablet Take 1 tablet (12.5 mg total) by mouth 3 (three) times daily as needed for dizziness. (Patient taking differently: Take 12.5 mg by mouth every 8 (eight) hours as needed for dizziness. )  . multivitamin (RENA-VIT) TABS tablet Take 1 tablet by mouth every evening.   Marland Kitchen OLANZapine (ZYPREXA) 5 MG tablet Take 0.5 tablets (2.5 mg total) by mouth at bedtime. (Patient taking differently: Take 5 mg by mouth at bedtime. )  . omega-3 acid ethyl esters (LOVAZA) 1 g capsule Take 1 g by mouth at bedtime.  . pantoprazole (PROTONIX) 40 MG tablet Take 1 tablet (40 mg total) by mouth 2 (two) times daily before a meal.  . pregabalin (LYRICA) 25 MG capsule Take 25 mg by mouth daily.  . promethazine (PHENERGAN) 25 MG suppository Place 1 suppository (25 mg total) rectally every 6 (six)  hours as needed for nausea.  . promethazine (PHENERGAN) 25 MG suppository Place 1 suppository (25 mg total) rectally every 6 (six) hours as needed for nausea. Stop taking the oral promethazine (phenergan).  Use these suppositories instead.  . ranitidine (ZANTAC) 150 MG tablet Take 150 mg by mouth daily. In the afternoon  . rifaximin (XIFAXAN) 550 MG TABS tablet Take 550 mg by mouth 2 (two) times daily.  . sevelamer carbonate (RENVELA) 800 MG tablet Take 2,400 mg by mouth 3 (three) times daily with meals.   . vitamin B-12  (CYANOCOBALAMIN) 500 MCG tablet Take 1,000 mcg by mouth daily.    Past Medical History:  Diagnosis Date  . Anemia   . Anxiety   . Arthritis   . Asthma   . chronic diastolic CHF 11/13/9796  . COPD (chronic obstructive pulmonary disease) ([place])   . Coronary artery disease    a. cath 2013: stenting to RCA (report not available); b. cath 2014: LM nl, pLAD 40%, mLAD nl, ost LCx 40%, mid LCx nl, pRCA 30% @ site of prior stent, mRCA 50%  . Depression   . Diabetes mellitus without complication ([place])   . Diabetic neuropathy ([place])   . dialysis 2006  . ESRD (end stage renal disease) on dialysis ([place])    M-W-F  . GERD (gastroesophageal reflux disease)   . Headache   . Hx of pancreatitis 2015  . Hypertension   . Mitral regurgitation    a. echo 10/2013: EF 62%, noWMA, mildly dilated LA, mild to mod MR/TR, GR1DD  . Myocardial infarction   . Pneumonia   . Renal insufficiency    Pt is on dialysis on M,W + F.    Past Surgical History:  Procedure Laterality Date  . ABDOMINAL HYSTERECTOMY    . APPENDECTOMY    . CARDIAC CATHETERIZATION Left 07/26/2015   Procedure: Left Heart Cath and Coronary Angiography;  Surgeon: Dionisio David, MD;  Location: [place] [facility];  Service: Cardiovascular;  Laterality: Left;  . CHOLECYSTECTOMY    . COLONOSCOPY WITH PROPOFOL N/A 08/12/2016   Procedure: COLONOSCOPY WITH PROPOFOL;  Surgeon: Lollie Sails, MD;  Location: [facility] ENDOSCOPY;  Service: Endoscopy;  Laterality: N/A;  . DIALYSIS FISTULA CREATION Left    upper arm  . ESOPHAGOGASTRODUODENOSCOPY N/A 03/08/2015   Procedure: ESOPHAGOGASTRODUODENOSCOPY (EGD);  Surgeon: Manya Silvas, MD;  Location: [facility] ENDOSCOPY;  Service: Endoscopy;  Laterality: N/A;  . ESOPHAGOGASTRODUODENOSCOPY (EGD) WITH PROPOFOL N/A 03/18/2016   Procedure: ESOPHAGOGASTRODUODENOSCOPY (EGD) WITH PROPOFOL;  Surgeon: [name], MD;  Location: [facility] ENDOSCOPY;  Service: Endoscopy;  Laterality: N/A;  . EYE SURGERY    . FECAL TRANSPLANT  N/A 08/23/2015   Procedure: FECAL TRANSPLANT;  Surgeon: Manya Silvas, MD;  Location[facility] ENDOSCOPY;  Service: Endoscopy;  Laterality: N/A;  . PERIPHERAL VASCULAR CATHETERIZATION N/A 12/20/2015   Procedure: Thrombectomy of dialysis access versus permcath placement;  Surgeon: Algernon Huxley, MD;  Location: [place] [facility];  Service: Cardiovascular;  Laterality: N/A;  . PERIPHERAL VASCULAR CATHETERIZATION N/A 12/20/2015   Procedure: A/V Shunt Intervention;  Surgeon: Algernon Huxley, MD;  Location: [place] [facility];  Service: Cardiovascular;  Laterality: N/A;  . PERIPHERAL VASCULAR CATHETERIZATION N/A 12/20/2015   Procedure: A/V Shuntogram/Fistulagram;  Surgeon: Algernon Huxley, MD;  Location: [place] [facility];  Service: Cardiovascular;  Laterality: N/A;  . PERIPHERAL VASCULAR CATHETERIZATION N/A 01/02/2016   Procedure: A/V Shuntogram/Fistulagram;  Surgeon: Algernon Huxley, MD;  Location: [place] [facility];  Service:  Cardiovascular;  Laterality: N/A;  . PERIPHERAL VASCULAR CATHETERIZATION N/A 01/02/2016   Procedure: A/V Shunt Intervention;  Surgeon: Algernon Huxley, MD;  Location: [place] [facility];  Service: Cardiovascular;  Laterality: N/A;    Social History Social History  Substance Use Topics  . Smoking status: Former Smoker    Packs/day: 0.50    Types: Cigarettes    Quit date: 02/13/2015  . Smokeless tobacco: Never Used  . Alcohol use No    Family History Family History  Problem Relation Age of Onset  . Kidney disease Mother   . Diabetes Mother   No family history of bleeding/clotting disorders, porphyria or autoimmune disease   Allergies  Allergen Reactions  . Compazine [Prochlorperazine Edisylate] Anaphylaxis and Nausea And Vomiting    23 Jul - patient relates that she takes promethazine frequently with no problems.  . Ace Inhibitors Swelling  . Ativan [Lorazepam] Other (See Comments)    Reaction:  Hallucinations and headaches  . Codeine Nausea And Vomiting  . Dilaudid  [Hydromorphone Hcl] Other (See Comments)    Delirium  . Gabapentin Other (See Comments)    Reaction:  Unknown   . Losartan Other (See Comments)    Reaction:  Unknown   . Ondansetron Other (See Comments)    Reaction:  Unknown   . Prochlorperazine Other (See Comments)    Reaction:  Unknown   . Reglan [Metoclopramide]     Per patient her Dr. Evelina Bucy her off it   . Scopolamine Other (See Comments)    Reaction:  Unknown   . Zofran [Ondansetron Hcl] Other (See Comments)    Reaction:  hallucinations   . Oxycodone Anxiety  . Tape Rash     REVIEW OF SYSTEMS (Negative unless checked)  Constitutional: [] Weight loss  [] Fever  [] Chills Cardiac: [] Chest pain   [] Chest pressure   [] Palpitations   [] Shortness of breath when laying flat   [] Shortness of breath with exertion. Vascular:  [] Pain in legs with walking   [] Pain in legs at rest  [] History of DVT   [] Phlebitis   [x] Swelling in legs   [] Varicose veins   [x] Non-healing ulcers Pulmonary:   [] Uses home oxygen   [] Productive cough   [] Hemoptysis   [] Wheeze  [] COPD   [] Asthma Neurologic:  [] Dizziness   [] Seizures   [] History of stroke   [] History of TIA  [] Aphasia   [] Vissual changes   [] Weakness or numbness in arm   [] Weakness or numbness in leg Musculoskeletal:   [] Joint swelling   [] Joint pain   [] Low back pain Hematologic:  [] Easy bruising  [] Easy bleeding   [] Hypercoagulable state   [] Anemic Gastrointestinal:  [] Diarrhea   [] Vomiting  [] Gastroesophageal reflux/heartburn   [] Difficulty swallowing. Genitourinary:  [] Chronic kidney disease   [] Difficult urination  [] Frequent urination   [] Blood in urine Skin:  [] Rashes   [] Ulcers  Psychological:  [] History of anxiety   []  History of major depression.  Physical Examination  Vitals:   09/18/16 1229  BP: (!) 159/63  Pulse: 69  Resp: 16  Weight: 73.5 kg (162 lb)   Body mass index is 26.96 kg/m. Gen: WD/WN, NAD Head: [place]/AT, No temporalis wasting.  Ear/Nose/Throat: Hearing grossly intact,  nares w/o erythema or drainage, poor dentition Eyes: PER, EOMI, sclera nonicteric.  Neck: Supple, no masses.  No bruit or JVD.  Pulmonary:  Good air movement, clear to auscultation bilaterally, no use of accessory muscles.  Cardiac: RRR, normal S1, S2, no Murmurs. Vascular: 2+ edema ulceration now healed atrophy  blockage consistent with healed scar Vessel Right Left  Radial Palpable Palpable  Ulnar Palpable Palpable  Brachial Palpable Palpable  Carotid Palpable Palpable  Femoral Palpable Palpable  Popliteal Palpable Palpable  PT Palpable Palpable  DP Palpable Palpable   Gastrointestinal: soft, non-distended. No guarding/no peritoneal signs.  Musculoskeletal: M/S 5/5 throughout.  No deformity or atrophy.  Neurologic: CN 2-12 intact. Pain and light touch intact in extremities.  Symmetrical.  Speech is fluent. Motor exam as listed above. Psychiatric: Judgment intact, Mood & affect appropriate for pt's clinical situation. Dermatologic: No rashes or ulcers noted.  No changes consistent with cellulitis. Lymph : No Cervical lymphadenopathy, no lichenification or skin changes of chronic lymphedema.  CBC Lab Results  Component Value Date   WBC 7.8 07/13/2016   HGB 11.4 (L) 07/13/2016   HCT 35.2 07/13/2016   MCV 78.5 (L) 07/13/2016   PLT 173 07/13/2016    BMET    Component Value Date/Time   NA 131 (L) 07/13/2016 1237   NA 135 (L) 09/15/2014 0948   K 4.6 07/13/2016 1237   K 4.8 09/15/2014 0948   CL 90 (L) 07/13/2016 1237   CL 99 09/15/2014 0948   CO2 30 07/13/2016 1237   CO2 26 09/15/2014 0948   GLUCOSE 146 (H) 07/13/2016 1237   GLUCOSE 118 (H) 09/15/2014 0948   BUN 45 (H) 07/13/2016 1237   BUN 19 (H) 09/15/2014 0948   CREATININE 5.44 (H) 07/13/2016 1237   CREATININE 6.79 (H) 09/15/2014 0948   CALCIUM 8.5 (L) 07/13/2016 1237   CALCIUM 8.3 (L) 09/15/2014 0948   GFRNONAA 8 (L) 07/13/2016 1237   GFRNONAA 7 (L) 09/15/2014 0948   GFRNONAA 6 (L) 05/31/2014 0432   GFRAA 9 (L)  07/13/2016 1237   GFRAA 8 (L) 09/15/2014 0948   GFRAA 7 (L) 05/31/2014 0432   CrCl cannot be calculated (Patient's most recent lab result is older than the maximum 21 days allowed.).  COAG Lab Results  Component Value Date   INR 1.08 04/21/2016   INR 0.97 12/28/2015   INR 1.01 07/26/2015    Radiology No results found.   Assessment/Plan 1. Chronic venous insufficiency No surgery or intervention at this point in time.    I have had a long discussion with the patient regarding venous insufficiency and why it  causes symptoms. I have discussed with the patient the chronic skin changes that accompany venous insufficiency and the long term sequela such as infection and ulceration.  Patient will begin wearing graduated compression stockings class 1 (20-30 mmHg) or compression wraps on a daily basis a prescription was given. The patient will put the stockings on first thing in the morning and removing them in the evening. The patient is instructed specifically not to sleep in the stockings.    In addition, behavioral modification including several periods of elevation of the lower extremities during the day will be continued. I have demonstrated that proper elevation is a position with the ankles at heart level.  The patient is instructed to begin routine exercise, especially walking on a daily basis  Patient should undergo duplex ultrasound of the venous system to ensure that DVT or reflux is not present.  Following the review of the ultrasound the patient will follow up in 2-3 months to reassess the degree of swelling and the control that graduated compression stockings or compression wraps  is offering.   The patient can be assessed for a Lymph Pump at that time  2. Essential hypertension Continue antihypertensive  medications as already ordered, these medications have been reviewed and there are no changes at this time.   3. Coronary artery disease involving native coronary artery of  native heart without angina pectoris Continue cardiac and antihypertensive medications as already ordered and reviewed, no changes at this time.  Continue statin as ordered and reviewed, no changes at this time  Nitrates PRN for chest pain   4. Type 2 diabetes mellitus with other circulatory complication, unspecified long term insulin use status (HCC) Continue hypoglycemic medications as already ordered, these medications have been reviewed and there are no changes at this time.  Hgb A1C to be monitored as already arranged by primary service   5. End-stage renal disease on hemodialysis [facility]) Recommend:  The patient is doing well and currently has adequate dialysis access. The patient's dialysis center is not reporting any access issues. Flow pattern is stable when compared to the prior ultrasound.  The patient should have a duplex ultrasound of the dialysis access in 3 months. The patient will follow-up with me in the office after each ultrasound   6. Lymphedema  No surgery or intervention at this point in time.    I have reviewed my previous discussion with the patient regarding swelling and why it  causes symptoms.  The patient is doing well with compression and will continue wearing graduated compression stockings class 1 (20-30 mmHg) on a daily basis a prescription was given. The patient will  continue wearing the stockings first thing in the morning and removing them in the evening. The patient is instructed specifically not to sleep in the stockings.    In addition, behavioral modification including elevation during the day and exercise will be continued.    Patient should follow-up as scheduled    Hortencia Pilar, MD  09/24/2016 1:29 PM

## 2016-11-16 IMAGING — CR DG ABDOMEN ACUTE W/ 1V CHEST
1 series · 3 of 3 positions shown · non-contrast
Comparison: 04/28/2016

CLINICAL DATA: Worsening right upper quadrant abdominal pain and
nausea. Dialysis patient.

EXAM:
DG ABDOMEN ACUTE W/ 1V CHEST

[Series 1: dg abd acute w/chest · 0.14mm/px · 3 of 3 slices shown]
[im 1/3]
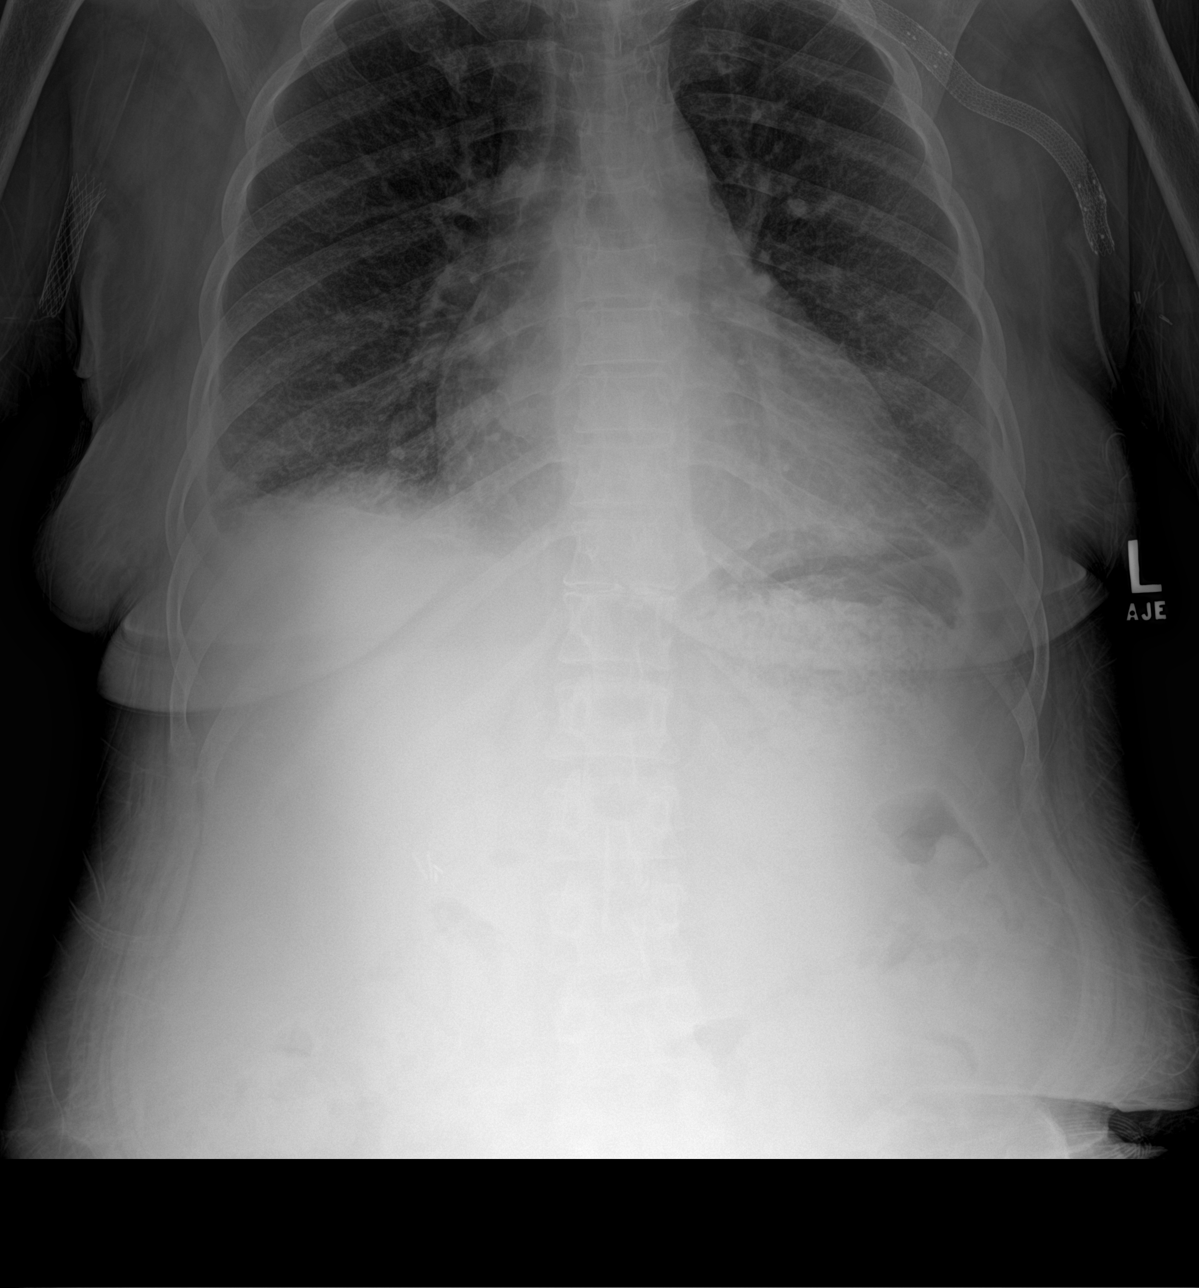
[im 2/3]
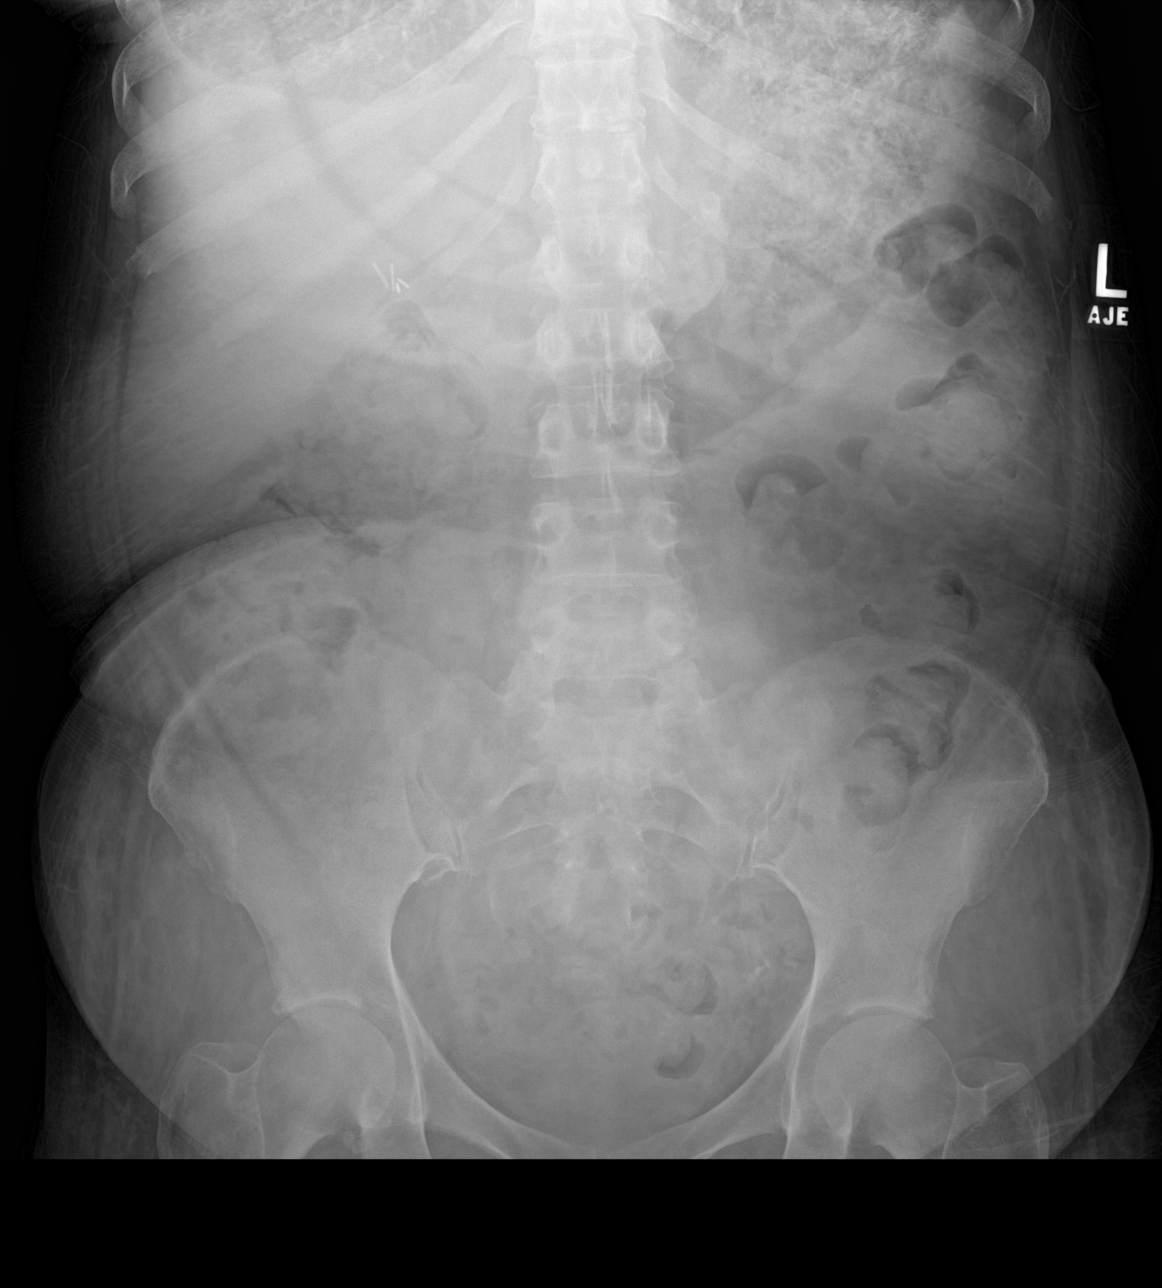
[im 3/3]
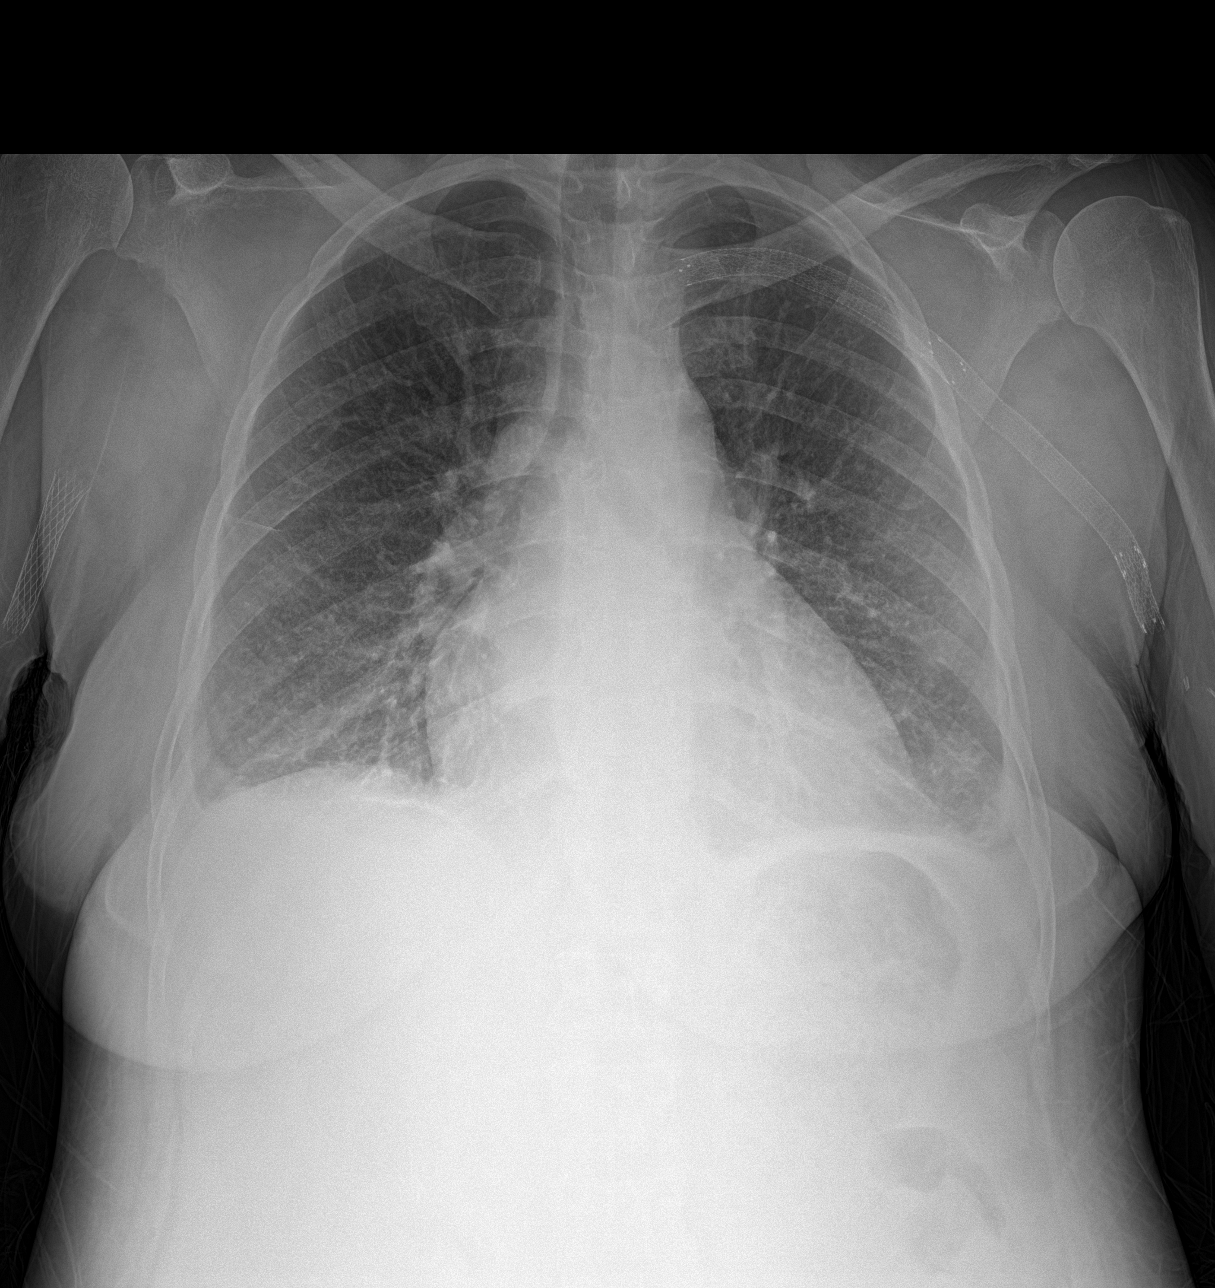

[3 of 3 positions shown; findings below may reference images not displayed]

FINDINGS: Chronic cardiomegaly. Mild venous hypertension without frank edema.
Chronic pleural blunting the could be due to a combination of
scarring and mild/ small fusions.

Bowel gas pattern does not show evidence of ileus, obstruction or
free air. The stomach appears to be full of ingested material. Clips
in the right upper quadrant consistent with previous
cholecystectomy. No acute bone finding.
IMPRESSION: No acute abdominal finding. Previous cholecystectomy. Stomach full
of food material. No evidence of ileus, obstruction or free air.

Chronic cardiomegaly and venous hypertension. Chronic pleural
blunting

## 2016-11-18 IMAGING — DX DG CHEST 1V
1 series · 1 of 1 positions shown · non-contrast
Comparison: 07/11/2016

CLINICAL DATA: Diabetes, epigastric pain

EXAM:
CHEST 1 VIEW

[chest ap]
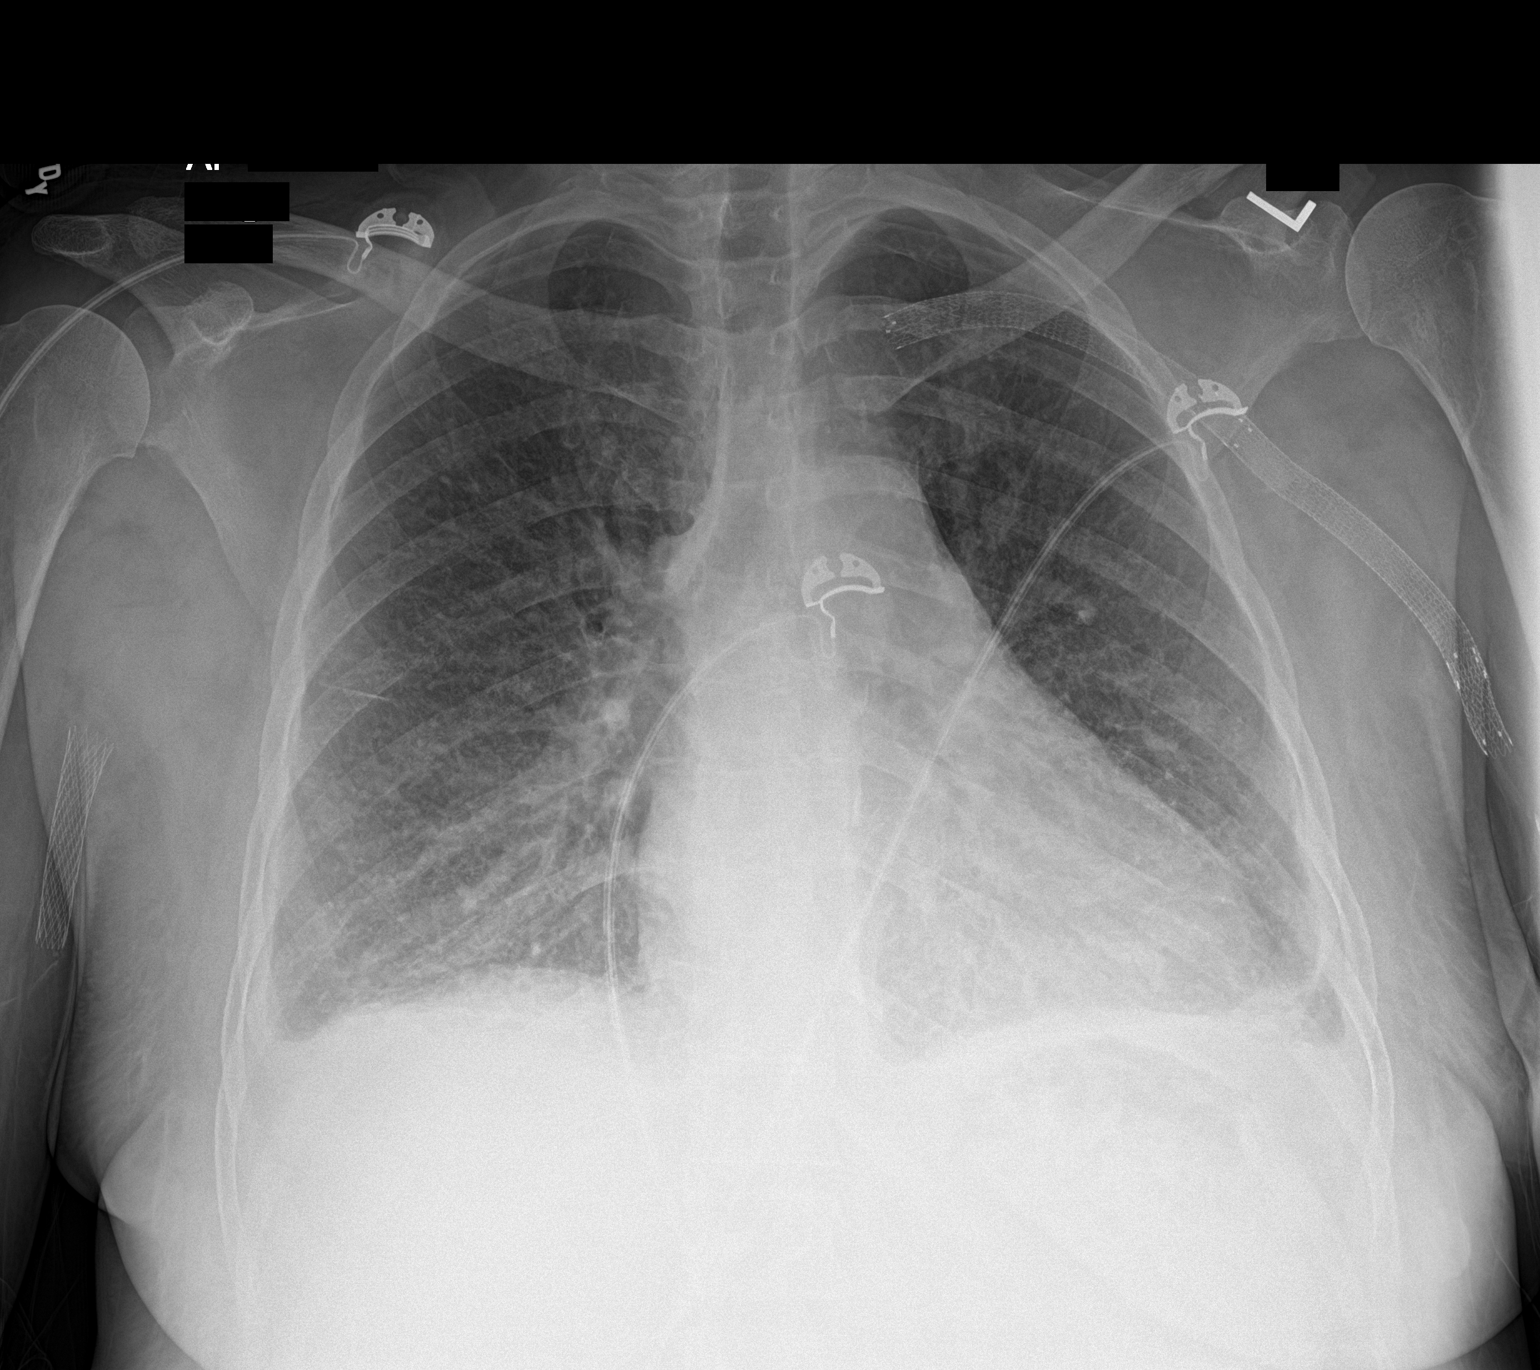

[1 of 1 positions shown; findings below may reference images not displayed]

FINDINGS: Cardiomediastinal silhouette is stable. No infiltrate or pleural
effusion. No pulmonary edema. Stable bilateral basilar chronic
atelectasis or scarring. Left subclavian stent is unchanged in
position.
IMPRESSION: No active disease.

## 2016-12-03 ENCOUNTER — Emergency Department: Payer: Medicare Other

## 2016-12-03 ENCOUNTER — Encounter: Payer: Self-pay | Admitting: Emergency Medicine

## 2016-12-03 ENCOUNTER — Emergency Department
Admission: EM | Admit: 2016-12-03 | Discharge: 2016-12-03 | Disposition: A | Discharge status: Home / Self Care | Payer: Medicare Other | Attending: Emergency Medicine | Admitting: Emergency Medicine

## 2016-12-03 DIAGNOSIS — Z79899 Other long term (current) drug therapy: Secondary | ICD-10-CM | POA: Diagnosis not present

## 2016-12-03 DIAGNOSIS — J449 Chronic obstructive pulmonary disease, unspecified: Secondary | ICD-10-CM | POA: Insufficient documentation

## 2016-12-03 DIAGNOSIS — R1013 Epigastric pain: Secondary | ICD-10-CM | POA: Diagnosis present

## 2016-12-03 DIAGNOSIS — R1011 Right upper quadrant pain: Secondary | ICD-10-CM | POA: Diagnosis not present

## 2016-12-03 DIAGNOSIS — I2510 Atherosclerotic heart disease of native coronary artery without angina pectoris: Secondary | ICD-10-CM | POA: Diagnosis not present

## 2016-12-03 DIAGNOSIS — Z87891 Personal history of nicotine dependence: Secondary | ICD-10-CM | POA: Insufficient documentation

## 2016-12-03 DIAGNOSIS — I132 Hypertensive heart and chronic kidney disease with heart failure and with stage 5 chronic kidney disease, or end stage renal disease: Secondary | ICD-10-CM | POA: Insufficient documentation

## 2016-12-03 DIAGNOSIS — N186 End stage renal disease: Secondary | ICD-10-CM | POA: Diagnosis not present

## 2016-12-03 DIAGNOSIS — J45909 Unspecified asthma, uncomplicated: Secondary | ICD-10-CM | POA: Insufficient documentation

## 2016-12-03 DIAGNOSIS — I5031 Acute diastolic (congestive) heart failure: Secondary | ICD-10-CM | POA: Insufficient documentation

## 2016-12-03 DIAGNOSIS — Z992 Dependence on renal dialysis: Secondary | ICD-10-CM | POA: Insufficient documentation

## 2016-12-03 DIAGNOSIS — R1010 Upper abdominal pain, unspecified: Secondary | ICD-10-CM

## 2016-12-03 LAB — CBC WITH DIFFERENTIAL/PLATELET
Basophils Absolute: 0 10*3/uL (ref 0–0.1)
Basophils Relative: 1 %
Eosinophils Absolute: 0.4 10*3/uL (ref 0–0.7)
Eosinophils Relative: 8 %
HCT: 36.6 % (ref 35.0–47.0)
Hemoglobin: 12.1 g/dL (ref 12.0–16.0)
Lymphocytes Relative: 32 %
Lymphs Abs: 1.6 10*3/uL (ref 1.0–3.6)
MCH: 32.3 pg (ref 26.0–34.0)
MCHC: 33.1 g/dL (ref 32.0–36.0)
MCV: 97.6 fL (ref 80.0–100.0)
Monocytes Absolute: 0.6 10*3/uL (ref 0.2–0.9)
Monocytes Relative: 12 %
Neutro Abs: 2.3 10*3/uL (ref 1.4–6.5)
Neutrophils Relative %: 47 %
Platelets: 243 10*3/uL (ref 150–440)
RBC: 3.75 MIL/uL — ABNORMAL LOW (ref 3.80–5.20)
RDW: 20.7 % — ABNORMAL HIGH (ref 11.5–14.5)
WBC: 4.9 10*3/uL (ref 3.6–11.0)

## 2016-12-03 LAB — COMPREHENSIVE METABOLIC PANEL
ALT: 14 U/L (ref 14–54)
AST: 22 U/L (ref 15–41)
Albumin: 3.7 g/dL (ref 3.5–5.0)
Alkaline Phosphatase: 164 U/L — ABNORMAL HIGH (ref 38–126)
Anion gap: 15 (ref 5–15)
BUN: 40 mg/dL — ABNORMAL HIGH (ref 6–20)
CO2: 27 mmol/L (ref 22–32)
Calcium: 8.7 mg/dL — ABNORMAL LOW (ref 8.9–10.3)
Chloride: 91 mmol/L — ABNORMAL LOW (ref 101–111)
Creatinine, Ser: 6.88 mg/dL — ABNORMAL HIGH (ref 0.44–1.00)
GFR calc Af Amer: 7 mL/min — ABNORMAL LOW (ref >60)
GFR calc non Af Amer: 6 mL/min — ABNORMAL LOW (ref >60)
Glucose, Bld: 263 mg/dL — ABNORMAL HIGH (ref 65–99)
Potassium: 5 mmol/L (ref 3.5–5.1)
Sodium: 133 mmol/L — ABNORMAL LOW (ref 135–145)
Total Bilirubin: 0.8 mg/dL (ref 0.3–1.2)
Total Protein: 8.1 g/dL (ref 6.5–8.1)

## 2016-12-03 LAB — LIPASE, BLOOD: Lipase: 12 U/L (ref 11–51)

## 2016-12-03 LAB — TROPONIN I: Troponin I: <0.03 ng/mL (ref <0.03)

## 2016-12-03 MED ORDER — PROMETHAZINE HCL 25 MG RE SUPP: 25.0000 mg | Freq: Four times a day (QID) | RECTAL | 0 refills | Status: DC | PRN

## 2016-12-03 MED ORDER — MORPHINE SULFATE (PF) 4 MG/ML IV SOLN
4.0000 mg | Freq: Once | INTRAVENOUS | Status: AC
  Administered 2016-12-03: 4 mg via INTRAVENOUS
  Filled 2016-12-03: qty 1

## 2016-12-03 MED ORDER — SODIUM CHLORIDE 0.9 % IV BOLUS (SEPSIS)
500.0000 mL | Freq: Once | INTRAVENOUS | Status: AC
  Administered 2016-12-03: 500 mL via INTRAVENOUS

## 2016-12-03 MED ORDER — PROMETHAZINE HCL 25 MG PO TABS: 25.0000 mg | ORAL_TABLET | Freq: Four times a day (QID) | ORAL | 0 refills | Status: DC | PRN

## 2016-12-03 MED ORDER — PROMETHAZINE HCL 25 MG/ML IJ SOLN
12.5000 mg | Freq: Once | INTRAMUSCULAR | Status: AC
  Administered 2016-12-03: 12.5 mg via INTRAVENOUS
  Filled 2016-12-03: qty 1

## 2016-12-03 MED ORDER — PROMETHAZINE HCL 25 MG/ML IJ SOLN
25.0000 mg | Freq: Once | INTRAMUSCULAR | Status: AC
  Administered 2016-12-03: 25 mg via INTRAVENOUS
  Filled 2016-12-03: qty 1

## 2016-12-03 NOTE — ED Notes (Signed)
Patient's discharge and follow up information reviewed with patient by ED nursing staff and patient given the opportunity to ask questions pertaining to ED visit and discharge plan of care. Patient advised that should symptoms not continue to improve, resolve entirely, or should new symptoms develop then a follow up visit with their PCP or a return visit to the ED may be warranted. Patient verbalized consent and understanding of discharge plan of care including potential need for further evaluation. Patient discharged in stable condition per attending ED physician on duty.  

## 2016-12-03 NOTE — Discharge Instructions (Signed)
Take phenergan suppository or oral for nausea and vomiting.   Take tylenol for pain.   See your doctor  Call dialysis tomorrow to arrange for dialysis tomorrow.   Return to ER if you have severe abdominal pain, vomiting, trouble breathing, fevers, chest pain.

## 2016-12-03 NOTE — ED Triage Notes (Signed)
Pt presents to ED via EMS c/o Lower left abd pain. Pt on dialysis and did not go today due to "not feeling well"

## 2016-12-03 NOTE — ED Provider Notes (Addendum)
[facility] Provider Note   CSN: [id number] Arrival date & time: 12/03/16  1130     History   Chief Complaint Chief Complaint  Patient presents with  . Abdominal Pain    HPI [name] is a 56 y.o. female history of ESRD on HD (last HD was 2 days ago), COPD, CAD status post stent, CHF, diabetes here presenting with Abdominal pain. Patient states that she woke up this morning and has epigastric pain and right upper quadrant pain. Patient told EMS she had left lower quadrant pain today. Was supposed to go to dialysis but did not go due to the pain. Feeling nauseated but has no vomiting. Patient had previous cholecystectomy. Denies any chest pain. Of note, patient has previous history abdominal pain and came to the ED several times before for similar complaints.    The history is provided by the patient.    Past Medical History:  Diagnosis Date  . Anemia   . Anxiety   . Arthritis   . Asthma   . chronic diastolic CHF 4/97/0263  . COPD (chronic obstructive pulmonary disease) ([place])   . Coronary artery disease    a. cath 2013: stenting to RCA (report not available); b. cath 2014: LM nl, pLAD 40%, mLAD nl, ost LCx 40%, mid LCx nl, pRCA 30% @ site of prior stent, mRCA 50%  . Depression   . Diabetes mellitus without complication ([place])   . Diabetic neuropathy ([place])   . dialysis 2006  . ESRD (end stage renal disease) on dialysis ([place])    M-W-F  . GERD (gastroesophageal reflux disease)   . Headache   . Hx of pancreatitis 2015  . Hypertension   . Mitral regurgitation    a. echo 10/2013: EF 62%, noWMA, mildly dilated LA, mild to mod MR/TR, GR1DD  . Myocardial infarction   . Pneumonia   . Renal insufficiency    Pt is on dialysis on M,W + F.    Patient Active Problem List   Diagnosis Date Noted  . Cellulitis of lower extremity 07/29/2016  . Chronic venous insufficiency 07/29/2016  . Lymphedema 07/29/2016  . TIA (transient ischemic attack) 04/21/2016  . Altered  mental status 04/08/2016  . Hyperammonemia ([place]) 04/08/2016  . Elevated troponin 04/08/2016  . ESRD (end stage renal disease) ([place]) 04/08/2016  . Depression 04/08/2016  . Depression, major, recurrent, severe with psychosis ([place]) 04/08/2016  . Blood in stool   . Intractable cyclical vomiting with nausea   . Reflux esophagitis   . Gastritis   . Generalized abdominal pain   . Uncontrollable vomiting   . Major depressive disorder, recurrent episode, moderate ([place]) 03/15/2016  . Adjustment disorder with mixed anxiety and depressed mood 03/15/2016  . Malnutrition of moderate degree 12/01/2015  . Renal mass   . Dyspnea   . Acute renal failure ([place])   . Respiratory failure ([place])   . High temperature 11/14/2015  . Pulmonary edema   . Encounter for central line placement   . Encounter for orogastric (OG) tube placement   . Nausea 11/12/2015  . Hyperkalemia 10/03/2015  . Diarrhea, unspecified 07/22/2015  . Pneumonia 05/21/2015  . Hypoglycemia 04/24/2015  . Unresponsiveness 04/24/2015  . Bradycardia 04/24/2015  . Hypothermia 04/24/2015  . Acute respiratory failure ([place]) 04/24/2015  . Acute diastolic CHF (congestive heart failure) ([place]) 04/05/2015  . Diabetic gastroparesis ([place]) 04/05/2015  . Hypokalemia 04/05/2015  . Generalized weakness 04/05/2015  . Acute pulmonary edema ([place]) 04/03/2015  . Nausea and vomiting  04/03/2015  . Hypoglycemia associated with diabetes ([place]) 04/03/2015  . Anemia of chronic disease 04/03/2015  . Secondary hyperparathyroidism ([place]) 04/03/2015  . Pressure ulcer 04/02/2015  . Acute respiratory failure with hypoxia ([place]) 04/01/2015  . Adjustment disorder with anxiety 03/14/2015  . Somatic symptom disorder, mild 03/08/2015  . Coronary artery disease involving native coronary artery of native heart without angina pectoris   . Nausea & vomiting 03/06/2015  . Abdominal pain 03/06/2015  . DM (diabetes mellitus) ([place]) 03/06/2015  . HTN (hypertension) 03/06/2015    . Gastroparesis 02/24/2015  . Pleural effusion 02/19/2015  . HCAP (healthcare-associated pneumonia) 02/19/2015  . End-stage renal disease on hemodialysis ([place]) 02/19/2015    Past Surgical History:  Procedure Laterality Date  . ABDOMINAL HYSTERECTOMY    . APPENDECTOMY    . CARDIAC CATHETERIZATION Left 07/26/2015   Procedure: Left Heart Cath and Coronary Angiography;  Surgeon: Dionisio David, MD;  Location: [facility];  Service: Cardiovascular;  Laterality: Left;  . CHOLECYSTECTOMY    . COLONOSCOPY WITH PROPOFOL N/A 08/12/2016   Procedure: COLONOSCOPY WITH PROPOFOL;  Surgeon: Lollie Sails, MD;  Location: [facility] ENDOSCOPY;  Service: Endoscopy;  Laterality: N/A;  . DIALYSIS FISTULA CREATION Left    upper arm  . ESOPHAGOGASTRODUODENOSCOPY N/A 03/08/2015   Procedure: ESOPHAGOGASTRODUODENOSCOPY (EGD);  Surgeon: Manya Silvas, MD;  Location: [facility] ENDOSCOPY;  Service: Endoscopy;  Laterality: N/A;  . ESOPHAGOGASTRODUODENOSCOPY (EGD) WITH PROPOFOL N/A 03/18/2016   Procedure: ESOPHAGOGASTRODUODENOSCOPY (EGD) WITH PROPOFOL;  Surgeon: [name], MD;  Location: [facility] ENDOSCOPY;  Service: Endoscopy;  Laterality: N/A;  . EYE SURGERY    . FECAL TRANSPLANT N/A 08/23/2015   Procedure: FECAL TRANSPLANT;  Surgeon: Manya Silvas, MD;  Location: [facility] ENDOSCOPY;  Service: Endoscopy;  Laterality: N/A;  . PERIPHERAL VASCULAR CATHETERIZATION N/A 12/20/2015   Procedure: Thrombectomy of dialysis access versus permcath placement;  Surgeon: Algernon Huxley, MD;  Location: [facility];  Service: Cardiovascular;  Laterality: N/A;  . PERIPHERAL VASCULAR CATHETERIZATION N/A 12/20/2015   Procedure: A/V Shunt Intervention;  Surgeon: Algernon Huxley, MD;  Location: [facility];  Service: Cardiovascular;  Laterality: N/A;  . PERIPHERAL VASCULAR CATHETERIZATION N/A 12/20/2015   Procedure: A/V Shuntogram/Fistulagram;  Surgeon: Algernon Huxley, MD;  Location: [facility];  Service: Cardiovascular;   Laterality: N/A;  . PERIPHERAL VASCULAR CATHETERIZATION N/A 01/02/2016   Procedure: A/V Shuntogram/Fistulagram;  Surgeon: Algernon Huxley, MD;  Location: [facility];  Service: Cardiovascular;  Laterality: N/A;  . PERIPHERAL VASCULAR CATHETERIZATION N/A 01/02/2016   Procedure: A/V Shunt Intervention;  Surgeon: Algernon Huxley, MD;  Location: [facility];  Service: Cardiovascular;  Laterality: N/A;    OB History    No data available       Home Medications    Prior to Admission medications   Medication Sig Start Date End Date Taking? Authorizing Provider  acidophilus (RISAQUAD) CAPS capsule Take 1 capsule by mouth daily.    Historical Provider, MD  albuterol (PROVENTIL HFA;VENTOLIN HFA) 108 (90 Base) MCG/ACT inhaler Inhale 2 puffs into the lungs every 4 (four) hours as needed for shortness of breath.     Historical Provider, MD  albuterol (PROVENTIL) (2.5 MG/3ML) 0.083% nebulizer solution Take 2.5 mg by nebulization every 4 (four) hours as needed for wheezing or shortness of breath.    Historical Provider, MD  amLODipine (NORVASC) 10 MG tablet Take 10 mg by mouth daily.    Historical Provider, MD  atorvastatin (LIPITOR)  20 MG tablet Take 20 mg by mouth at bedtime.     Historical Provider, MD  cetirizine (ZYRTEC) 10 MG tablet Take 10 mg by mouth daily.     Historical Provider, MD  diclofenac sodium (VOLTAREN) 1 % GEL Apply 2 g topically 4 (four) times daily.    Historical Provider, MD  FLUoxetine (PROZAC) 20 MG capsule Take 1 capsule (20 mg total) by mouth daily. Patient taking differently: Take 20 mg by mouth at bedtime.  04/10/16   Lytle Butte, MD  glucose 4 GM chewable tablet Chew 3-4 tablets by mouth as needed for low blood sugar.    Historical Provider, MD  hydrALAZINE (APRESOLINE) 25 MG tablet Take 25 mg by mouth 3 (three) times daily.    Historical Provider, MD  HYDROcodone-acetaminophen (NORCO/VICODIN) 5-325 MG tablet Take 1 tablet by mouth every 6 (six) hours as needed for  moderate pain or severe pain. 04/29/16   Loletha Grayer, MD  hydrOXYzine (ATARAX/VISTARIL) 25 MG tablet Take 25 mg by mouth every 6 (six) hours as needed for itching.    Historical Provider, MD  insulin aspart (NOVOLOG) 100 UNIT/ML injection Inject 0-6 Units into the skin 3 (three) times daily before meals. Per sliding scale    Historical Provider, MD  insulin glargine (LANTUS) 100 UNIT/ML injection Inject 8 Units into the skin at bedtime.    Historical Provider, MD  lamoTRIgine (LAMICTAL) 25 MG tablet Take 25 mg by mouth at bedtime.    Historical Provider, MD  lipase/protease/amylase (CREON) 12000 UNITS CPEP capsule Take 36,000 Units by mouth 2 (two) times daily.     Historical Provider, MD  lubiprostone (AMITIZA) 24 MCG capsule Take 24 mcg by mouth daily. 07/13/16   Historical Provider, MD  meclizine (ANTIVERT) 12.5 MG tablet Take 1 tablet (12.5 mg total) by mouth 3 (three) times daily as needed for dizziness. Patient taking differently: Take 12.5 mg by mouth every 8 (eight) hours as needed for dizziness.  04/29/16   Loletha Grayer, MD  multivitamin (RENA-VIT) TABS tablet Take 1 tablet by mouth every evening.     Historical Provider, MD  OLANZapine (ZYPREXA) 5 MG tablet Take 0.5 tablets (2.5 mg total) by mouth at bedtime. Patient taking differently: Take 5 mg by mouth at bedtime.  04/29/16   Loletha Grayer, MD  omega-3 acid ethyl esters (LOVAZA) 1 g capsule Take 1 g by mouth at bedtime.    Historical Provider, MD  pantoprazole (PROTONIX) 40 MG tablet Take 1 tablet (40 mg total) by mouth 2 (two) times daily before a meal. 03/19/16   Gladstone Lighter, MD  pregabalin (LYRICA) 25 MG capsule Take 25 mg by mouth daily.    Historical Provider, MD  promethazine (PHENERGAN) 25 MG suppository Place 1 suppository (25 mg total) rectally every 6 (six) hours as needed for nausea. 07/13/16 07/13/17  Orbie Pyo, MD  promethazine (PHENERGAN) 25 MG suppository Place 1 suppository (25 mg total) rectally  every 6 (six) hours as needed for nausea. Stop taking the oral promethazine (phenergan).  Use these suppositories instead. 07/13/16 07/13/17  Orbie Pyo, MD  ranitidine (ZANTAC) 150 MG tablet Take 150 mg by mouth daily. In the afternoon    Historical Provider, MD  rifaximin (XIFAXAN) 550 MG TABS tablet Take 550 mg by mouth 2 (two) times daily.    Historical Provider, MD  sevelamer carbonate (RENVELA) 800 MG tablet Take 2,400 mg by mouth 3 (three) times daily with meals.     Historical Provider, MD  vitamin  B-12 (CYANOCOBALAMIN) 500 MCG tablet Take 1,000 mcg by mouth daily.    Historical Provider, MD    Family History Family History  Problem Relation Age of Onset  . Kidney disease Mother   . Diabetes Mother     Social History Social History  Substance Use Topics  . Smoking status: Former Smoker    Packs/day: 0.50    Types: Cigarettes    Quit date: 02/13/2015  . Smokeless tobacco: Never Used  . Alcohol use No     Allergies   Compazine [prochlorperazine edisylate]; Ace inhibitors; Ativan [lorazepam]; Codeine; Dilaudid [hydromorphone hcl]; Gabapentin; Losartan; Ondansetron; Prochlorperazine; Reglan [metoclopramide]; Scopolamine; Zofran [ondansetron hcl]; Oxycodone; and Tape   Review of Systems Review of Systems  Gastrointestinal: Positive for abdominal pain.  All other systems reviewed and are negative.    Physical Exam Updated Vital Signs BP (!) 174/74 (BP Location: Right Arm)   Pulse 83   Temp (!) 94.7 F (34.8 C) (Rectal)   Resp 19   SpO2 100%   Physical Exam  Constitutional: She is oriented to person, place, and time.  Uncomfortable, retching   HENT:  Head: Normocephalic.  MM slightly dry   Eyes: EOM are normal. Pupils are equal, round, and reactive to light.  Neck: Normal range of motion. Neck supple.  Cardiovascular: Normal rate, regular rhythm and normal heart sounds.   Pulmonary/Chest: Effort normal and breath sounds normal. No respiratory  distress. She has no wheezes. She has no rales.  Abdominal: Soft. Bowel sounds are normal.  + RUQ and epigastric tenderness.   Musculoskeletal: Normal range of motion.  Neurological: She is alert and oriented to person, place, and time.  Skin: Skin is warm.  Psychiatric: She has a normal mood and affect.  Nursing note and vitals reviewed.    ED Treatments / Results  Labs (all labs ordered are listed, but only abnormal results are displayed) Labs Reviewed  CBC WITH DIFFERENTIAL/PLATELET - Abnormal; Notable for the following:       Result Value   RBC 3.75 (*)    RDW 20.7 (*)    All other components within normal limits  COMPREHENSIVE METABOLIC PANEL - Abnormal; Notable for the following:    Sodium 133 (*)    Chloride 91 (*)    Glucose, Bld 263 (*)    BUN 40 (*)    Creatinine, Ser 6.88 (*)    Calcium 8.7 (*)    Alkaline Phosphatase 164 (*)    GFR calc non Af Amer 6 (*)    GFR calc Af Amer 7 (*)    All other components within normal limits  C DIFFICILE QUICK SCREEN W PCR REFLEX  TROPONIN I  LIPASE, BLOOD    EKG  EKG Interpretation None       ED ECG REPORT I, Wandra Arthurs, the attending physician, personally viewed and interpreted this ECG.   Date: 12/03/2016  EKG Time: 11:47 am  Rate: 76  Rhythm: normal EKG, normal sinus rhythm  Axis: normal  Intervals:none  ST&T Change: nonspecific    Radiology Ct Abdomen Pelvis Wo Contrast  Result Date: 12/03/2016 CLINICAL DATA:  Left lower abdomen pain, the patient is on dialysis EXAM: CT ABDOMEN AND PELVIS WITHOUT CONTRAST TECHNIQUE: Multidetector CT imaging of the abdomen and pelvis was performed following the standard protocol without IV contrast. COMPARISON:  CT abdomen pelvis of 07/31/2016 FINDINGS: Lower chest: No pneumonia is seen. There is a small right pleural effusion present. Cardiomegaly is stable. No pericardial effusion is  seen. Hepatobiliary: The liver is unremarkable in the unenhanced state. Surgical clips are  noted from prior cholecystectomy. Pancreas: The pancreas is normal in size and the pancreatic duct is not dilated. Spleen: The spleen is unremarkable. Adrenals/Urinary Tract: The adrenal glands appear normal. The kidneys are small and somewhat atrophic in this renal failure patient on dialysis. No hydronephrosis is seen. Renovascular calcification is present diffusely and there are arterial calcifications diffusely as well. Stomach/Bowel: The stomach is distended with fluid. No small bowel distention is seen. There are diverticula within the rectosigmoid colon and distal descending colon but no diverticulitis is seen. There are also diverticula scattered throughout the more proximal colon. The terminal ileum is unremarkable. The appendix has by history been resected. Vascular/Lymphatic: The abdominal aorta is normal in caliber with moderate abdominal aortic atherosclerosis noted. No adenopathy is seen. Reproductive: The uterus has been resected previously. No adnexal lesion is seen. No fluid is noted within the pelvis. Other: There is strandiness throughout the soft tissues which may indicate fluid overload status. Musculoskeletal: The lumbar vertebrae are in normal alignment. No compression deformity is seen. IMPRESSION: 1. No explanation for the patient's left lower quadrant pain is seen. There are colonic diverticula present but no diverticulitis is noted. 2. Small right pleural effusion.  Cardiomegaly. 3. Atrophic native kidneys.  No hydronephrosis. 4. Moderate abdominal aortic atherosclerosis with diffuse arterial calcifications. 5. Some strandiness throughout the soft tissues diffusely may indicate fluid overload. Electronically Signed   By: Ivar Drape M.D.   On: 12/03/2016 13:25    Procedures Procedures (including critical care time)  Angiocath insertion Performed by: Wandra Arthurs  Consent: Verbal consent obtained. Risks and benefits: risks, benefits and alternatives were discussed Time out:  Immediately prior to procedure a "time out" was called to verify the correct patient, procedure, equipment, support staff and site/side marked as required.  Preparation: Patient was prepped and draped in the usual sterile fashion.  Vein Location: R forearm  Ultrasound Guided  Gauge: 20 long   Normal blood return and flush without difficulty Patient tolerance: Patient tolerated the procedure well with no immediate complications.     Medications Ordered in ED Medications  morphine 4 MG/ML injection 4 mg (4 mg Intravenous Given 12/03/16 1234)  promethazine (PHENERGAN) injection 25 mg (25 mg Intravenous Given 12/03/16 1234)  sodium chloride 0.9 % bolus 500 mL (500 mLs Intravenous New Bag/Given 12/03/16 1235)     Initial Impression / Assessment and Plan / ED Course  I have reviewed the triage vital signs and the nursing notes.  Pertinent labs & imaging results that were available during my care of the patient were reviewed by me and considered in my medical decision making (see chart for details).     EZELLE SURPRENANT is a 56 y.o. female here with abdominal pain. Hx of ESRD on HD, previous ED visit for ab pain and does have hx of cholecystectomy. Consider worsening chronic ab pain vs SBO. Will get labs, CT ab/pel.  2:52 PM Patient difficult IV stick. Able to get US guided IV and labs. Labs at baseline. K 5.0. CT ab/pel unremarkable. Had previous ED visits for abdominal pain. No vomiting in the ED. Tolerated IV phenergan in the ED, will sent home with same. Will have her call dialysis tomorrow to get dialyzed but doesn't need emergent dialysis. Temp initially slightly low but now normal. Will dc home.      Final Clinical Impressions(s) / ED Diagnoses   Final diagnoses:  None  New Prescriptions New Prescriptions   No medications on file     Drenda Freeze, MD 12/03/16 [place] Yao, MD 12/03/16 [phone]

## 2016-12-03 NOTE — ED Notes (Signed)
1st IV attempt by this nurse

## 2016-12-05 ENCOUNTER — Emergency Department: Payer: Medicare Other

## 2016-12-05 ENCOUNTER — Encounter: Payer: Self-pay | Admitting: Emergency Medicine

## 2016-12-05 ENCOUNTER — Emergency Department
Admission: EM | Admit: 2016-12-05 | Discharge: 2016-12-05 | Disposition: A | Discharge status: Home / Self Care | Payer: Medicare Other | Attending: Emergency Medicine | Admitting: Emergency Medicine

## 2016-12-05 DIAGNOSIS — Z79899 Other long term (current) drug therapy: Secondary | ICD-10-CM | POA: Insufficient documentation

## 2016-12-05 DIAGNOSIS — I132 Hypertensive heart and chronic kidney disease with heart failure and with stage 5 chronic kidney disease, or end stage renal disease: Secondary | ICD-10-CM | POA: Diagnosis not present

## 2016-12-05 DIAGNOSIS — E1122 Type 2 diabetes mellitus with diabetic chronic kidney disease: Secondary | ICD-10-CM | POA: Diagnosis not present

## 2016-12-05 DIAGNOSIS — Z87891 Personal history of nicotine dependence: Secondary | ICD-10-CM | POA: Insufficient documentation

## 2016-12-05 DIAGNOSIS — Z794 Long term (current) use of insulin: Secondary | ICD-10-CM | POA: Diagnosis not present

## 2016-12-05 DIAGNOSIS — N186 End stage renal disease: Secondary | ICD-10-CM | POA: Diagnosis not present

## 2016-12-05 DIAGNOSIS — J45909 Unspecified asthma, uncomplicated: Secondary | ICD-10-CM | POA: Diagnosis not present

## 2016-12-05 DIAGNOSIS — I2510 Atherosclerotic heart disease of native coronary artery without angina pectoris: Secondary | ICD-10-CM | POA: Diagnosis not present

## 2016-12-05 DIAGNOSIS — Z992 Dependence on renal dialysis: Secondary | ICD-10-CM | POA: Insufficient documentation

## 2016-12-05 DIAGNOSIS — R1013 Epigastric pain: Secondary | ICD-10-CM | POA: Diagnosis not present

## 2016-12-05 DIAGNOSIS — I5032 Chronic diastolic (congestive) heart failure: Secondary | ICD-10-CM | POA: Insufficient documentation

## 2016-12-05 DIAGNOSIS — Z7982 Long term (current) use of aspirin: Secondary | ICD-10-CM | POA: Diagnosis not present

## 2016-12-05 DIAGNOSIS — R1010 Upper abdominal pain, unspecified: Secondary | ICD-10-CM | POA: Diagnosis present

## 2016-12-05 LAB — CBC
HCT: 37.1 % (ref 35.0–47.0)
Hemoglobin: 12.2 g/dL (ref 12.0–16.0)
MCH: 32.1 pg (ref 26.0–34.0)
MCHC: 33 g/dL (ref 32.0–36.0)
MCV: 97.3 fL (ref 80.0–100.0)
Platelets: 207 10*3/uL (ref 150–440)
RBC: 3.82 MIL/uL (ref 3.80–5.20)
RDW: 21.2 % — ABNORMAL HIGH (ref 11.5–14.5)
WBC: 6.5 10*3/uL (ref 3.6–11.0)

## 2016-12-05 LAB — COMPREHENSIVE METABOLIC PANEL
ALT: 22 U/L (ref 14–54)
AST: 39 U/L (ref 15–41)
Albumin: 3.8 g/dL (ref 3.5–5.0)
Alkaline Phosphatase: 160 U/L — ABNORMAL HIGH (ref 38–126)
Anion gap: 14 (ref 5–15)
BUN: 18 mg/dL (ref 6–20)
CO2: 27 mmol/L (ref 22–32)
Calcium: 9 mg/dL (ref 8.9–10.3)
Chloride: 93 mmol/L — ABNORMAL LOW (ref 101–111)
Creatinine, Ser: 4.14 mg/dL — ABNORMAL HIGH (ref 0.44–1.00)
GFR calc Af Amer: 13 mL/min — ABNORMAL LOW (ref >60)
GFR calc non Af Amer: 11 mL/min — ABNORMAL LOW (ref >60)
Glucose, Bld: 166 mg/dL — ABNORMAL HIGH (ref 65–99)
Potassium: 4 mmol/L (ref 3.5–5.1)
Sodium: 134 mmol/L — ABNORMAL LOW (ref 135–145)
Total Bilirubin: 0.9 mg/dL (ref 0.3–1.2)
Total Protein: 8.4 g/dL — ABNORMAL HIGH (ref 6.5–8.1)

## 2016-12-05 LAB — LIPASE, BLOOD: Lipase: <10 U/L — ABNORMAL LOW (ref 11–51)

## 2016-12-05 MED ORDER — OXYCODONE-ACETAMINOPHEN 5-325 MG PO TABS
1.0000 | ORAL_TABLET | Freq: Once | ORAL | Status: AC
  Administered 2016-12-05: 1 via ORAL

## 2016-12-05 MED ORDER — MORPHINE SULFATE (PF) 4 MG/ML IV SOLN
4.0000 mg | Freq: Once | INTRAVENOUS | Status: AC
  Administered 2016-12-05: 4 mg via INTRAVENOUS
  Filled 2016-12-05: qty 1

## 2016-12-05 MED ORDER — PROMETHAZINE HCL 25 MG PO TABS
50.0000 mg | ORAL_TABLET | Freq: Once | ORAL | Status: AC
  Administered 2016-12-05: 50 mg via ORAL

## 2016-12-05 MED ORDER — LORAZEPAM 2 MG/ML IJ SOLN
1.0000 mg | Freq: Once | INTRAMUSCULAR | Status: AC
  Administered 2016-12-05: 1 mg via INTRAVENOUS
  Filled 2016-12-05: qty 1

## 2016-12-05 MED ORDER — METOCLOPRAMIDE HCL 5 MG/ML IJ SOLN
10.0000 mg | Freq: Once | INTRAMUSCULAR | Status: AC
  Administered 2016-12-05: 10 mg via INTRAVENOUS
  Filled 2016-12-05: qty 2

## 2016-12-05 MED ORDER — ONDANSETRON HCL 4 MG/2ML IJ SOLN
4.0000 mg | Freq: Once | INTRAMUSCULAR | Status: DC
  Filled 2016-12-05: qty 2

## 2016-12-05 MED ORDER — PROMETHAZINE HCL 25 MG PO TABS
ORAL_TABLET | ORAL | Status: AC
  Administered 2016-12-05: 50 mg via ORAL
  Filled 2016-12-05: qty 2

## 2016-12-05 MED ORDER — OXYCODONE-ACETAMINOPHEN 5-325 MG PO TABS
ORAL_TABLET | ORAL | Status: AC
  Administered 2016-12-05: 1 via ORAL
  Filled 2016-12-05: qty 1

## 2016-12-05 MED ORDER — PROMETHAZINE HCL 25 MG PO TABS: 25.0000 mg | ORAL_TABLET | Freq: Once | ORAL | Status: DC

## 2016-12-05 NOTE — ED Notes (Signed)
Pt states that she cannot Zofran because it makes her hallucinate.

## 2016-12-05 NOTE — ED Notes (Signed)
Left message for pt son and pt daughter to see if they can come pick pt up.

## 2016-12-05 NOTE — ED Notes (Signed)
Pt sleeping. Respirations equal and unlabored.

## 2016-12-05 NOTE — ED Notes (Signed)
Called lab to come draw blood on patient

## 2016-12-05 NOTE — ED Notes (Signed)
FIRST NURSE NOTE: Arrived via EMS from dialysis.  Still has dialysis needles in place.

## 2016-12-05 NOTE — ED Notes (Signed)
Pt sleeping. Respirations equal and unlabored at this time.

## 2016-12-05 NOTE — ED Notes (Signed)
Pt resting comfortably at this time. Breathing equal and unlabored. Informed pt we are waiting for dialysis to de-access her dialysis fistula and then she will be ready to go home.

## 2016-12-05 NOTE — ED Notes (Signed)
RN in room to check on pt, ED tech Crystal in patients room, Pt O2 sats 70-80's% with good wave form. Patient placed on 4 liters O2 and Dr. Jimmye Norman called to patients bedside.   Patient O2 sat increased to 100% on 4 liters, per Dr. Jimmye Norman OK to decreased to 2 Liter via [place]. Will continue to monitor patient.

## 2016-12-05 NOTE — ED Triage Notes (Signed)
Per [facility], patient comes from dialysis with c/o abdominal pain. Patient dry heaving in triage. Yelling out in pain.

## 2016-12-05 NOTE — ED Notes (Signed)
Charlie from dialysis called back and stated that he will come in and deaccess patients dialysis access

## 2016-12-05 NOTE — ED Provider Notes (Signed)
[facility] Emergency Department Provider Note        Time seen: ----------------------------------------- 12:13 PM on 12/05/2016 -----------------------------------------    I have reviewed the triage vital signs and the nursing notes.   HISTORY  Chief Complaint No chief complaint on file.    HPI [name] is a 56 y.o. female who presents to the ER for abdominal pain.Patient comes from dialysis with severe upper abdominal pain. Patient presents dry heaving and yelling out in pain. Currently pain is 10 on a 10, nothing makes it better or worse. Recently seen for same 2 days ago. She denies other complaints at this time.   Past Medical History:  Diagnosis Date  . Anemia   . Anxiety   . Arthritis   . Asthma   . chronic diastolic CHF 3/97/6734  . COPD (chronic obstructive pulmonary disease) ([place])   . Coronary artery disease    a. cath 2013: stenting to RCA (report not available); b. cath 2014: LM nl, pLAD 40%, mLAD nl, ost LCx 40%, mid LCx nl, pRCA 30% @ site of prior stent, mRCA 50%  . Depression   . Diabetes mellitus without complication ([place])   . Diabetic neuropathy ([place])   . dialysis 2006  . ESRD (end stage renal disease) on dialysis ([place])    M-W-F  . GERD (gastroesophageal reflux disease)   . Headache   . Hx of pancreatitis 2015  . Hypertension   . Mitral regurgitation    a. echo 10/2013: EF 62%, noWMA, mildly dilated LA, mild to mod MR/TR, GR1DD  . Myocardial infarction   . Pneumonia   . Renal insufficiency    Pt is on dialysis on M,W + F.    Patient Active Problem List   Diagnosis Date Noted  . Cellulitis of lower extremity 07/29/2016  . Chronic venous insufficiency 07/29/2016  . Lymphedema 07/29/2016  . TIA (transient ischemic attack) 04/21/2016  . Altered mental status 04/08/2016  . Hyperammonemia ([place]) 04/08/2016  . Elevated troponin 04/08/2016  . ESRD (end stage renal disease) ([place]) 04/08/2016  . Depression 04/08/2016  .  Depression, major, recurrent, severe with psychosis ([place]) 04/08/2016  . Blood in stool   . Intractable cyclical vomiting with nausea   . Reflux esophagitis   . Gastritis   . Generalized abdominal pain   . Uncontrollable vomiting   . Major depressive disorder, recurrent episode, moderate ([place]) 03/15/2016  . Adjustment disorder with mixed anxiety and depressed mood 03/15/2016  . Malnutrition of moderate degree 12/01/2015  . Renal mass   . Dyspnea   . Acute renal failure ([place])   . Respiratory failure ([place])   . High temperature 11/14/2015  . Pulmonary edema   . Encounter for central line placement   . Encounter for orogastric (OG) tube placement   . Nausea 11/12/2015  . Hyperkalemia 10/03/2015  . Diarrhea, unspecified 07/22/2015  . Pneumonia 05/21/2015  . Hypoglycemia 04/24/2015  . Unresponsiveness 04/24/2015  . Bradycardia 04/24/2015  . Hypothermia 04/24/2015  . Acute respiratory failure ([place]) 04/24/2015  . Acute diastolic CHF (congestive heart failure) ([place]) 04/05/2015  . Diabetic gastroparesis ([place]) 04/05/2015  . Hypokalemia 04/05/2015  . Generalized weakness 04/05/2015  . Acute pulmonary edema ([place]) 04/03/2015  . Nausea and vomiting 04/03/2015  . Hypoglycemia associated with diabetes ([place]) 04/03/2015  . Anemia of chronic disease 04/03/2015  . Secondary hyperparathyroidism ([place]) 04/03/2015  . Pressure ulcer 04/02/2015  . Acute respiratory failure with hypoxia ([place]) 04/01/2015  . Adjustment disorder with anxiety  03/14/2015  . Somatic symptom disorder, mild 03/08/2015  . Coronary artery disease involving native coronary artery of native heart without angina pectoris   . Nausea & vomiting 03/06/2015  . Abdominal pain 03/06/2015  . DM (diabetes mellitus) ([place]) 03/06/2015  . HTN (hypertension) 03/06/2015  . Gastroparesis 02/24/2015  . Pleural effusion 02/19/2015  . HCAP (healthcare-associated pneumonia) 02/19/2015  . End-stage renal disease on hemodialysis ([place]) 02/19/2015     Past Surgical History:  Procedure Laterality Date  . ABDOMINAL HYSTERECTOMY    . APPENDECTOMY    . CARDIAC CATHETERIZATION Left 07/26/2015   Procedure: Left Heart Cath and Coronary Angiography;  Surgeon: Dionisio David, MD;  Location: [place] CV LAB;  Service: Cardiovascular;  Laterality: Left;  . CHOLECYSTECTOMY    . COLONOSCOPY WITH PROPOFOL N/A 08/12/2016   Procedure: COLONOSCOPY WITH PROPOFOL;  Surgeon: Lollie Sails, MD;  Location: [facility] ENDOSCOPY;  Service: Endoscopy;  Laterality: N/A;  . DIALYSIS FISTULA CREATION Left    upper arm  . ESOPHAGOGASTRODUODENOSCOPY N/A 03/08/2015   Procedure: ESOPHAGOGASTRODUODENOSCOPY (EGD);  Surgeon: Manya Silvas, MD;  Location: [facility] ENDOSCOPY;  Service: Endoscopy;  Laterality: N/A;  . ESOPHAGOGASTRODUODENOSCOPY (EGD) WITH PROPOFOL N/A 03/18/2016   Procedure: ESOPHAGOGASTRODUODENOSCOPY (EGD) WITH PROPOFOL;  Surgeon: [name], MD;  Location: [facility] ENDOSCOPY;  Service: Endoscopy;  Laterality: N/A;  . EYE SURGERY    . FECAL TRANSPLANT N/A 08/23/2015   Procedure: FECAL TRANSPLANT;  Surgeon: Manya Silvas, MD;  Location: [facility] ENDOSCOPY;  Service: Endoscopy;  Laterality: N/A;  . PERIPHERAL VASCULAR CATHETERIZATION N/A 12/20/2015   Procedure: Thrombectomy of dialysis access versus permcath placement;  Surgeon: Algernon Huxley, MD;  Location: [place] CV LAB;  Service: Cardiovascular;  Laterality: N/A;  . PERIPHERAL VASCULAR CATHETERIZATION N/A 12/20/2015   Procedure: A/V Shunt Intervention;  Surgeon: Algernon Huxley, MD;  Location: [place] [facility] LAB;  Service: Cardiovascular;  Laterality: N/A;  . PERIPHERAL VASCULAR CATHETERIZATION N/A 12/20/2015   Procedure: A/V Shuntogram/Fistulagram;  Surgeon: Algernon Huxley, MD;  Location: [place] CV LAB;  Service: Cardiovascular;  Laterality: N/A;  . PERIPHERAL VASCULAR CATHETERIZATION N/A 01/02/2016   Procedure: A/V Shuntogram/Fistulagram;  Surgeon: Algernon Huxley, MD;  Location: [place] [facility] LAB;   Service: Cardiovascular;  Laterality: N/A;  . PERIPHERAL VASCULAR CATHETERIZATION N/A 01/02/2016   Procedure: A/V Shunt Intervention;  Surgeon: Algernon Huxley, MD;  Location: [place] [facility] LAB;  Service: Cardiovascular;  Laterality: N/A;    Allergies Compazine [prochlorperazine edisylate]; Ace inhibitors; Ativan [lorazepam]; Codeine; Dilaudid [hydromorphone hcl]; Gabapentin; Losartan; Ondansetron; Prochlorperazine; Reglan [metoclopramide]; Scopolamine; Zofran [ondansetron hcl]; Oxycodone; and Tape  Social History Social History  Substance Use Topics  . Smoking status: Former Smoker    Packs/day: 0.50    Types: Cigarettes    Quit date: 02/13/2015  . Smokeless tobacco: Never Used  . Alcohol use No    Review of Systems Constitutional: Negative for fever. Cardiovascular: Negative for chest pain. Respiratory: Negative for shortness of breath. Gastrointestinal: Positive for abdominal pain, vomiting Genitourinary: Negative for dysuria. Musculoskeletal: Negative for back pain. Skin: Negative for rash. Neurological: Negative for headaches, focal weakness or numbness.  10-point ROS otherwise negative.  ____________________________________________   PHYSICAL EXAM:  VITAL SIGNS: ED Triage Vitals  Enc Vitals Group     BP      Pulse      Resp      Temp      Temp src      SpO2      Weight  Height      Head Circumference      Peak Flow      Pain Score      Pain Loc      Pain Edu?      Excl. in [place]?     Constitutional: Alert and oriented. Moderate distress Eyes: Conjunctivae are normal. PERRL. Normal extraocular movements. ENT   Head: Normocephalic and atraumatic.   Nose: No congestion/rhinnorhea.   Mouth/Throat: Mucous membranes are moist.   Neck: No stridor. Cardiovascular: Normal rate, regular rhythm. No murmurs, rubs, or gallops. Respiratory: Normal respiratory effort without tachypnea nor retractions. Breath sounds are clear and equal bilaterally. No  wheezes/rales/rhonchi. Gastrointestinal: Epigastric tenderness, no rebound or guarding. Normal bowel sounds. Musculoskeletal: Nontender with normal range of motion in all extremities. No lower extremity tenderness nor edema. Neurologic:  Normal speech and language. No gross focal neurologic deficits are appreciated.  Skin:  Skin is warm, dry and intact. No rash noted. Psychiatric: Mood and affect are normal. Speech and behavior are normal.  ____________________________________________  ED COURSE:  Pertinent labs & imaging results that were available during my care of the patient were reviewed by me and considered in my medical decision making (see chart for details). Patient presents to the ER with abdominal pain and vomiting after dialysis. She has been seen for same in the past. We will assess with labs and imaging. Clinical Course as of Dec 05 1699  Fri Dec 05, 2016  1425 Patient resting comfortably in her bed  [JW]    Clinical Course User Index [JW] Earleen Newport, MD   Procedures ____________________________________________   LABS (pertinent positives/negatives)  Labs Reviewed  LIPASE, BLOOD - Abnormal; Notable for the following:       Result Value   Lipase <10 (*)    All other components within normal limits  COMPREHENSIVE METABOLIC PANEL - Abnormal; Notable for the following:    Sodium 134 (*)    Chloride 93 (*)    Glucose, Bld 166 (*)    Creatinine, Ser 4.14 (*)    Total Protein 8.4 (*)    Alkaline Phosphatase 160 (*)    GFR calc non Af Amer 11 (*)    GFR calc Af Amer 13 (*)    All other components within normal limits  URINALYSIS, COMPLETE (UACMP) WITH MICROSCOPIC  CBC    RADIOLOGY Abdomen 2 view IMPRESSION: 1. Congestive heart failure with bilateral pulmonary interstitial edema. Small bilateral pleural effusions.  2. Stool noted throughout the colon. No bowel distention. No acute intra-abdominal abnormality  . ____________________________________________  FINAL ASSESSMENT AND PLAN  Abdominal pain, vomiting, end-stage renal disease  Plan: Patient with labs and imaging as dictated above. Patient is currently improved from the pain she had earlier, is in no distress. Labs are reassuring. She is stable for outpatient follow-up.   Earleen Newport, MD   Note: This note was generated in part or whole with voice recognition software. Voice recognition is usually quite accurate but there are transcription errors that can and very often do occur. I apologize for any typographical errors that were not detected and corrected.     Earleen Newport, MD 12/05/16 [phone]

## 2016-12-06 IMAGING — CT CT ABDOMEN WO/W CM
2 of 6 series · 13 of 32 positions shown, 18 images · IV contrast (APPLIED)
Comparison: 04/21/2016.  02/16/2016.  11/12/2015.

CLINICAL DATA: Renal mass.  Follow-up.  End-stage renal disease.

EXAM:
CT ABDOMEN WITHOUT AND WITH CONTRAST
TECHNIQUE: Multidetector CT imaging of the abdomen was performed following the
standard protocol before and following the bolus administration of
intravenous contrast.
CONTRAST:  100 cc of Isovue 370

[Series 8: axial arterial · axial · arterial · 0.72mm/px · z∈[-664,-482]mm · 7 of 83 slices shown, 12 images]
[im 11/83  soft-tissue]
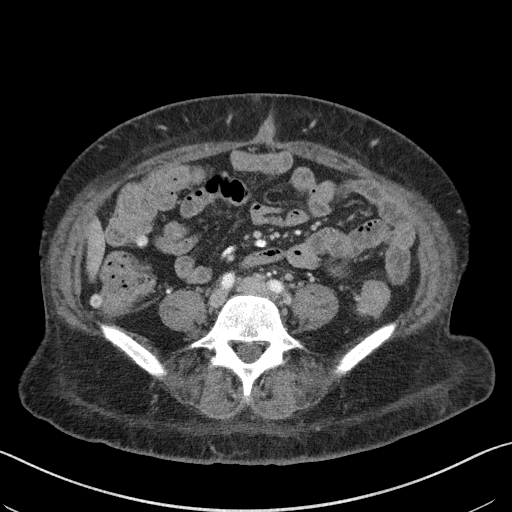
[im 11/83  bone]
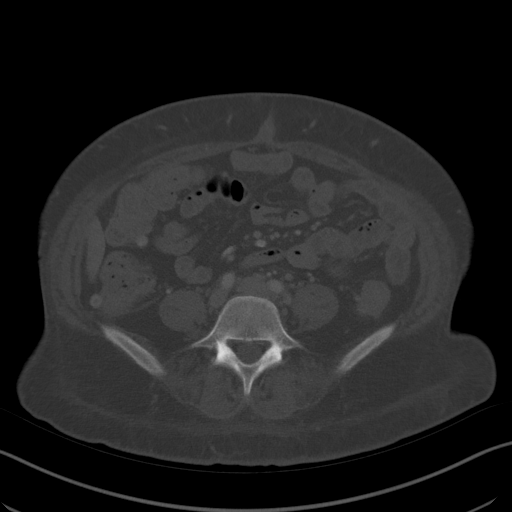
[im 21/83  soft-tissue]
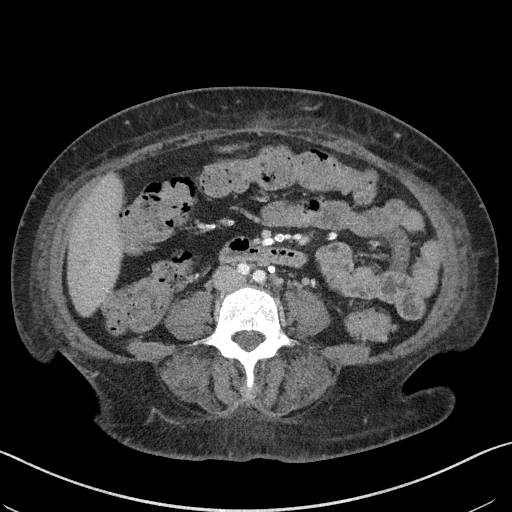
[im 31/83  soft-tissue]
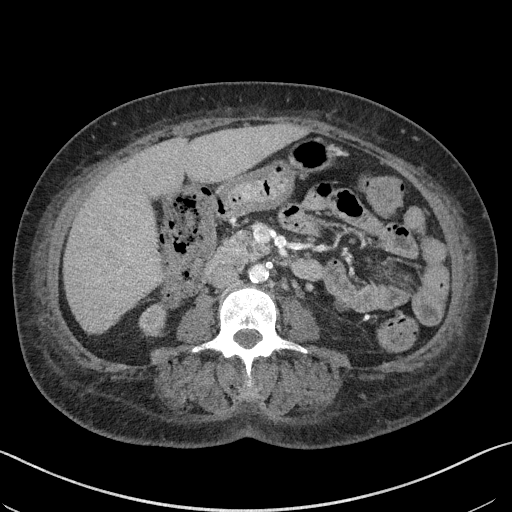
[im 42/83  soft-tissue]
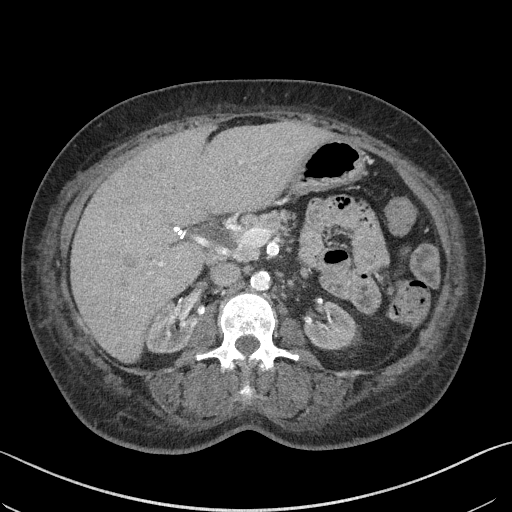
[im 42/83  lung]
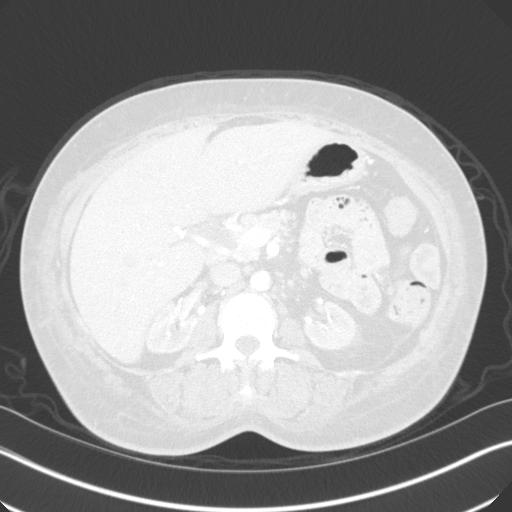
[im 52/83  soft-tissue]
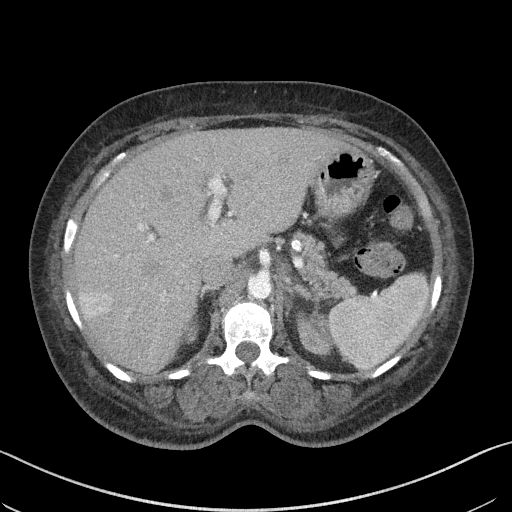
[im 52/83  lung]
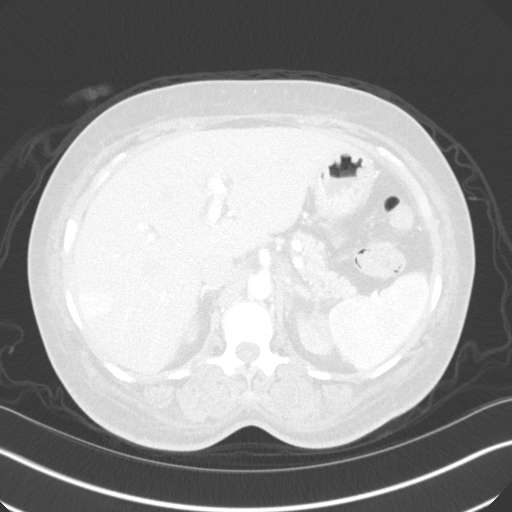
[im 62/83  soft-tissue]
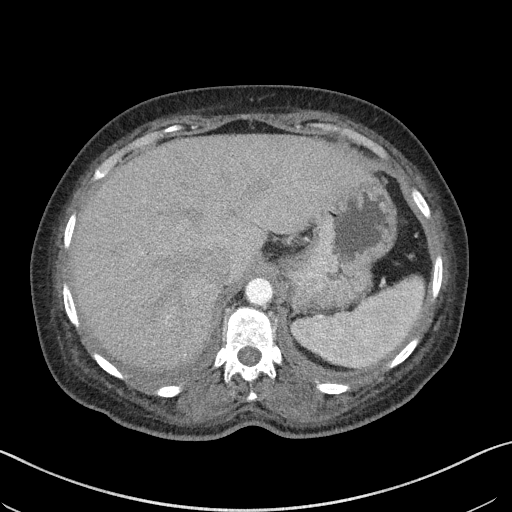
[im 62/83  lung]
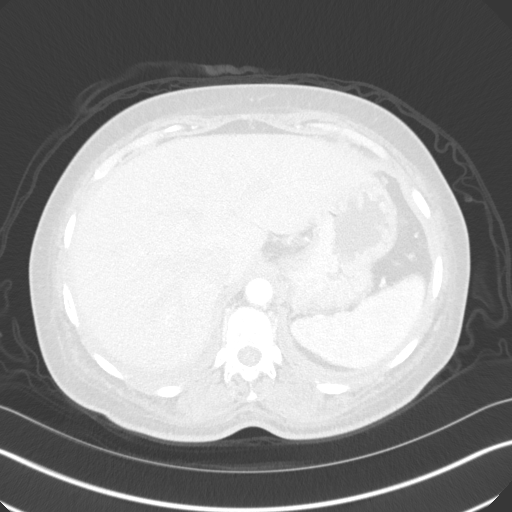
[im 72/83  soft-tissue]
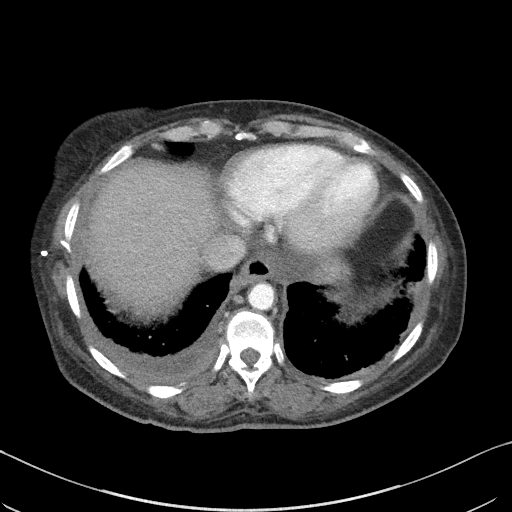
[im 72/83  lung]
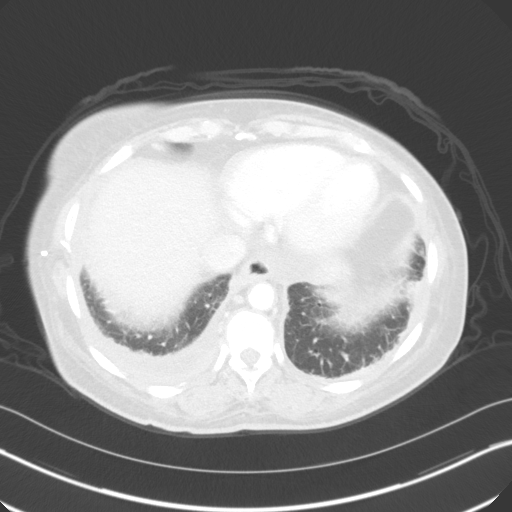

[Series 13: axial nephro · axial · 0.72mm/px · z∈[-664,-514]mm · 6 of 81 slices shown]
[im 11/81  soft-tissue]
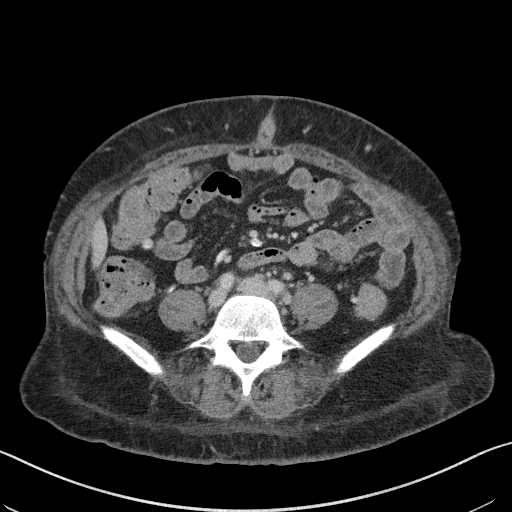
[im 21/81  soft-tissue]
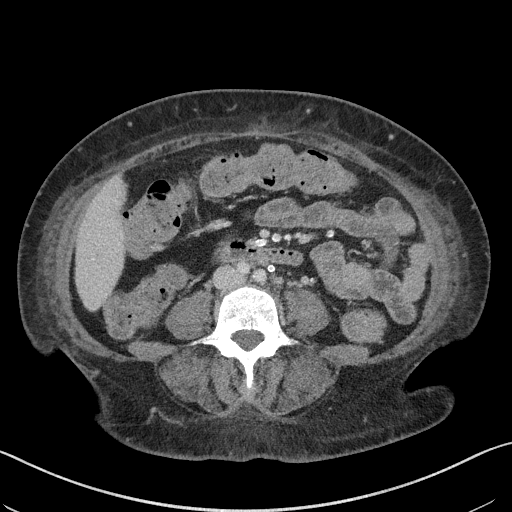
[im 31/81  soft-tissue]
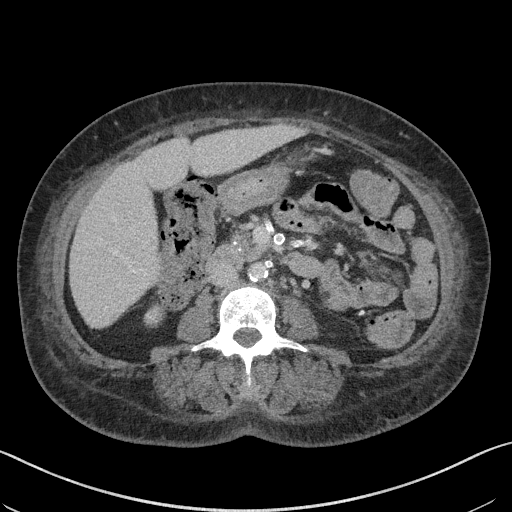
[im 41/81  soft-tissue]
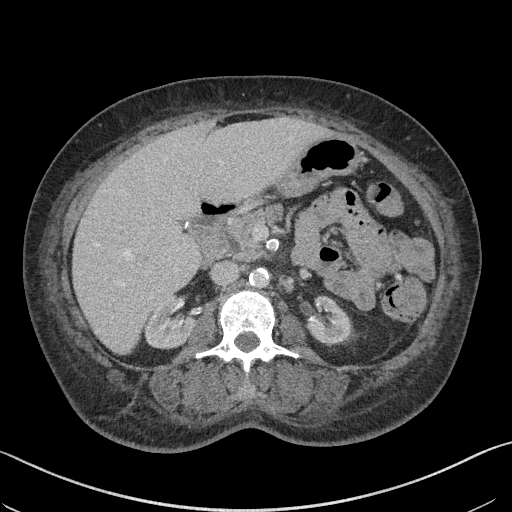
[im 51/81  soft-tissue]
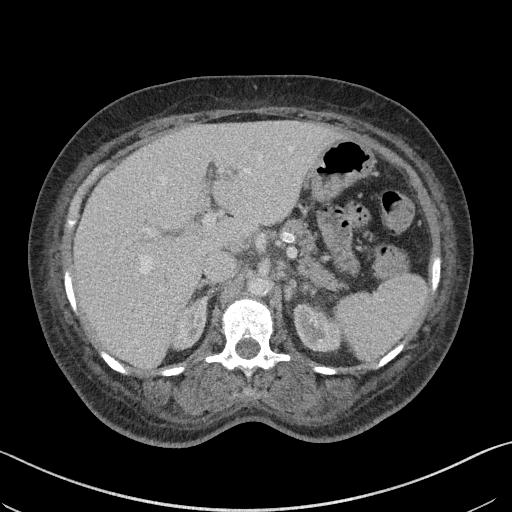
[im 61/81  soft-tissue]
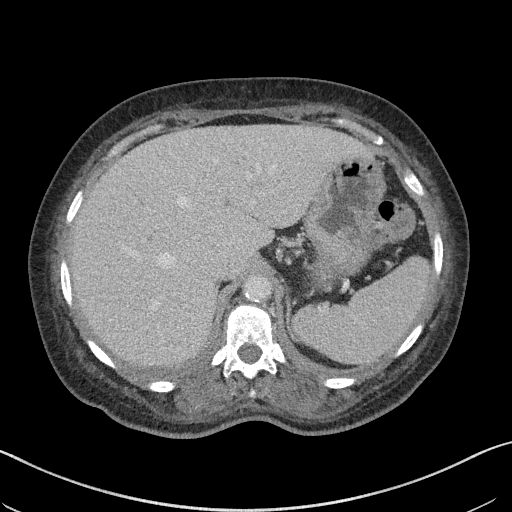

[13 of 32 positions shown; findings below may reference images not displayed]

FINDINGS: Lower chest: Mild bibasilar septal thickening. Borderline
cardiomegaly with small right and trace left pleural effusions. Both
effusions are smaller. There is right-sided pleural thickening,
suggesting chronicity. Right coronary artery atherosclerosis.

Hepatobiliary: Wedge-shaped area of arterial hyperenhancement in the
right lobe of the liver measures maximally 2.5 cm on image 32/series
8. This is isodense to the remainder the liver on portal venous
phase imaging. Not readily apparent on 11/12/2015. Hepatomegaly at
20.5 cm craniocaudal. Cholecystectomy, without biliary ductal
dilatation.

Pancreas: Normal, without mass or ductal dilatation.

Spleen: Normal in size, without focal abnormality.

Adrenals/Urinary Tract: Normal adrenal glands. Moderate to marked
bilateral renal atrophy. Renal vascular calcifications, without
definite renal calculi. No hydronephrosis or abdominal ureteric
stones.

Enhancing lateral upper pole right renal 11 mm lesion image
37/series 8 is unchanged in size since the prior exam of 11/12/2015.

More medial upper pole right renal 1.3 cm lesion image 40/ series 8
is also not significantly changed.

A far anterior 6 mm enhancing lesion in the upper pole right kidney
is identified on image 38/series 8. This is also similar on the
prior. No left-sided or new lesions are seen.

Stomach/Bowel: Normal stomach, without wall thickening. Colonic
stool burden suggests constipation. Scattered colonic diverticula.
Normal abdominal small bowel.

Vascular/Lymphatic: Advanced aortic and branch vessel
atherosclerosis. Patent renal veins. Left periaortic node measures
10 mm on image 45/series 8 and is similar (when remeasured). Upper
normal 6 mm retrocrural node is not significantly changed.

Other: No ascites.

Musculoskeletal: No acute osseous abnormality.
IMPRESSION: 1. Similar size and appearance of 3 upper pole right renal enhancing
lesions, most consistent with renal cell carcinomas. No evidence of
metastatic disease.
2. Probable perfusion anomaly in the right lobe of the liver.
Recommend attention on follow-up.
3. Decrease in right greater than left pleural effusions, with right
pleural thickening suggesting chronicity.
4. Borderline retroperitoneal adenopathy is favored to be reactive
but warrants followup attention.
5.  Possible constipation.
6. Advanced atherosclerosis. Suspect mild interstitial edema at the
lung bases.

## 2016-12-16 DIAGNOSIS — R21 Rash and other nonspecific skin eruption: Secondary | ICD-10-CM | POA: Insufficient documentation

## 2016-12-16 IMAGING — US US ABDOMEN LIMITED
1 series · 14 of 25 positions shown · non-contrast
Comparison: Abdominal and pelvic CT scan March 15, 2016

CLINICAL DATA: Right upper quadrant pain for the past 2 months,
increased symptoms this morning ; previous cholecystectomy

EXAM:
US ABDOMEN LIMITED - RIGHT UPPER QUADRANT

[Series 1: us abdomen limited · 0.30mm/px · 14 of 50 slices shown]
[im 1/50]
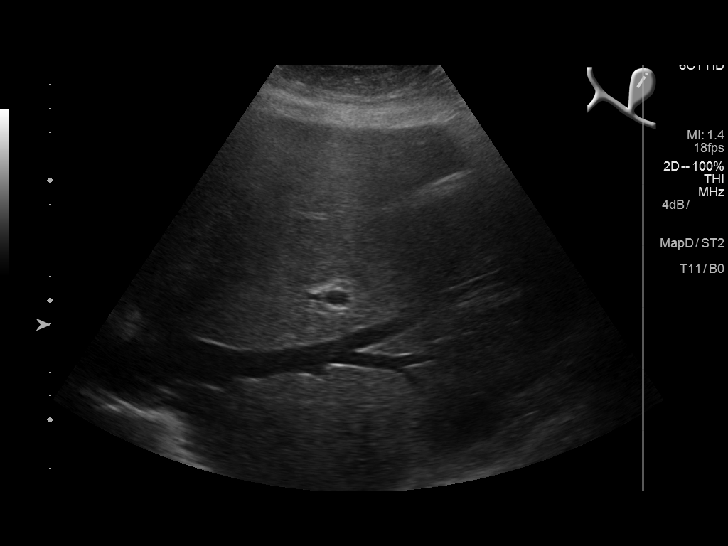
[im 5/50]
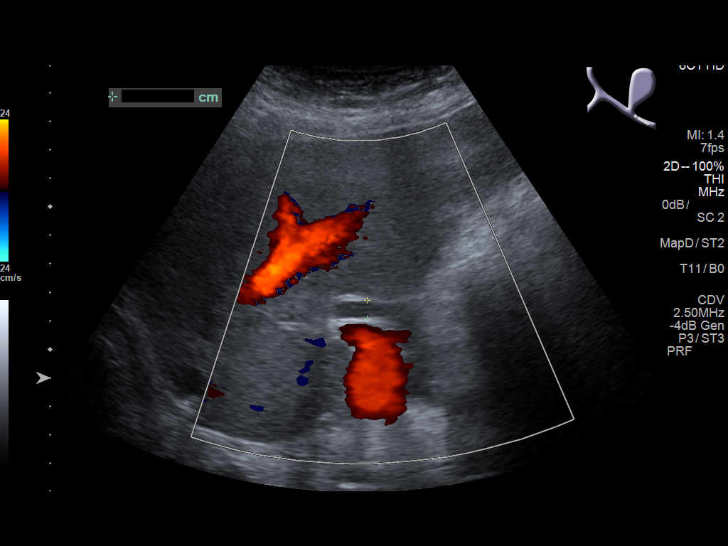
[im 9/50]
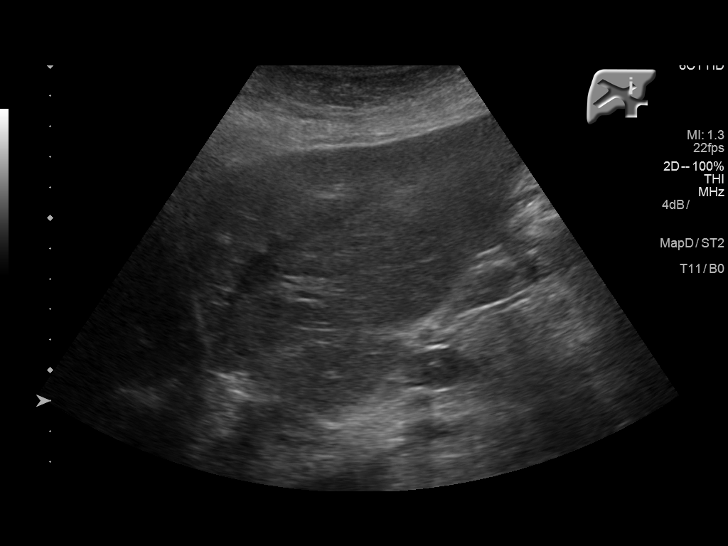
[im 13/50]
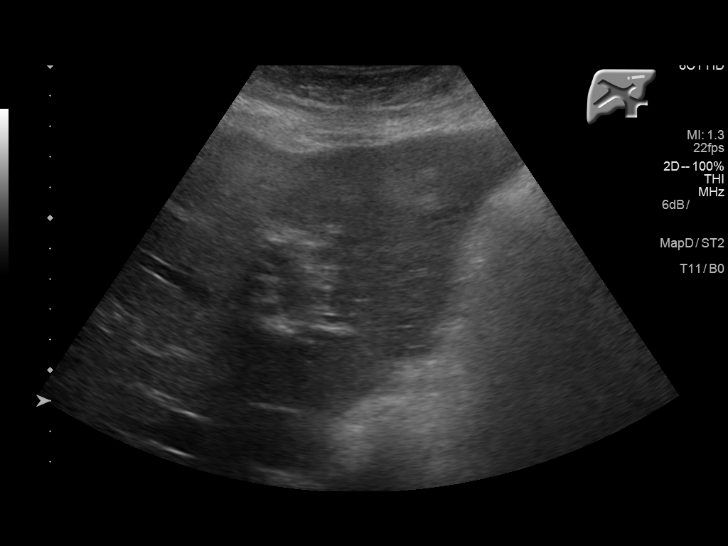
[im 17/50]
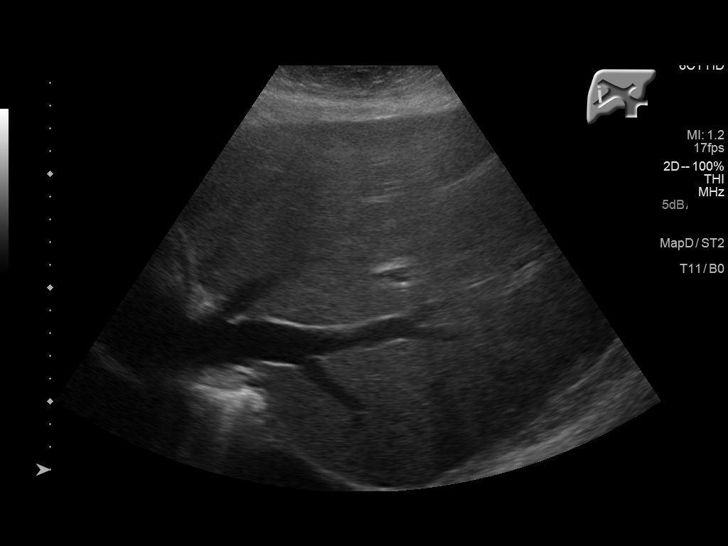
[im 19/50]
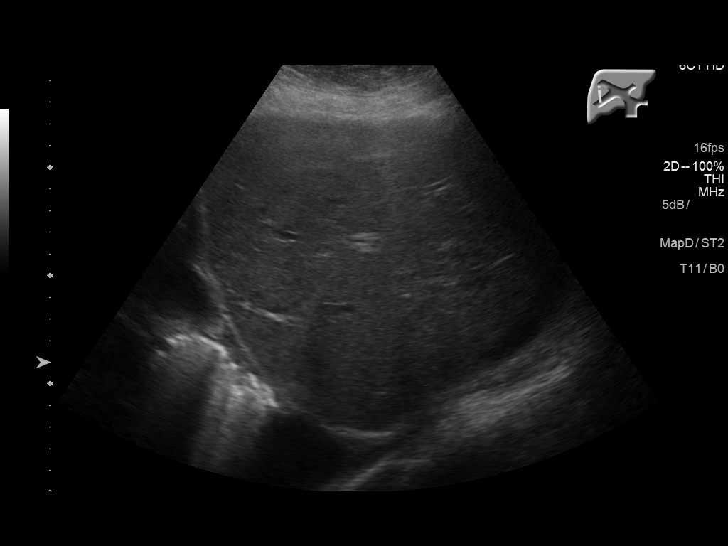
[im 23/50]
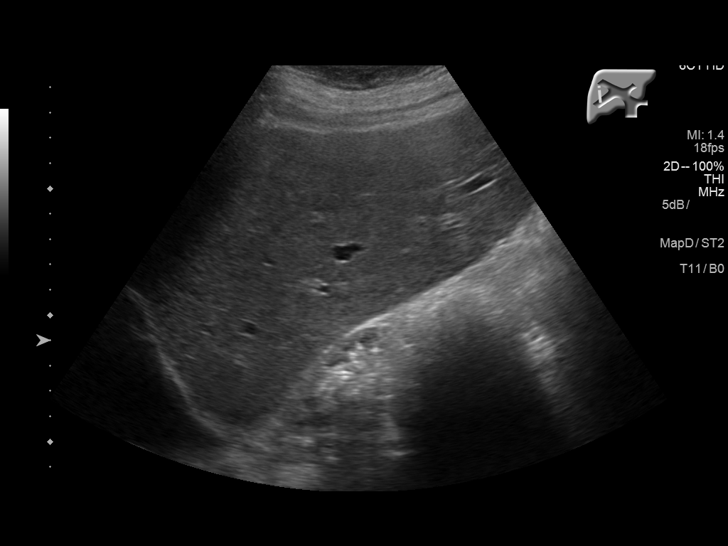
[im 27/50]
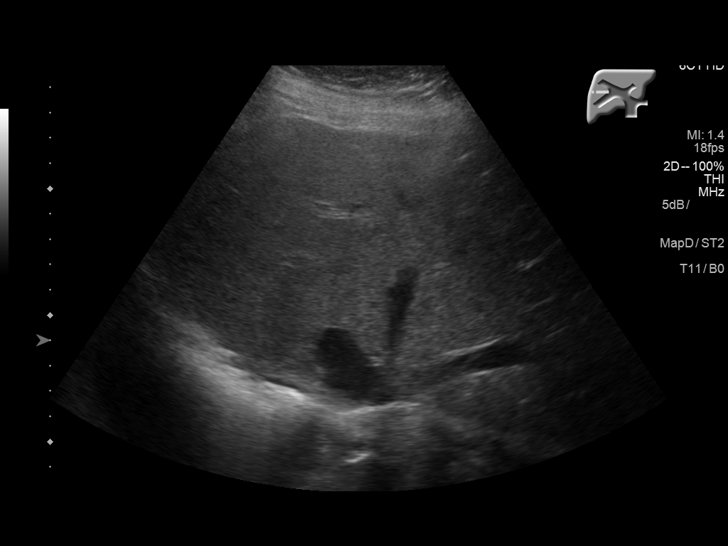
[im 31/50]
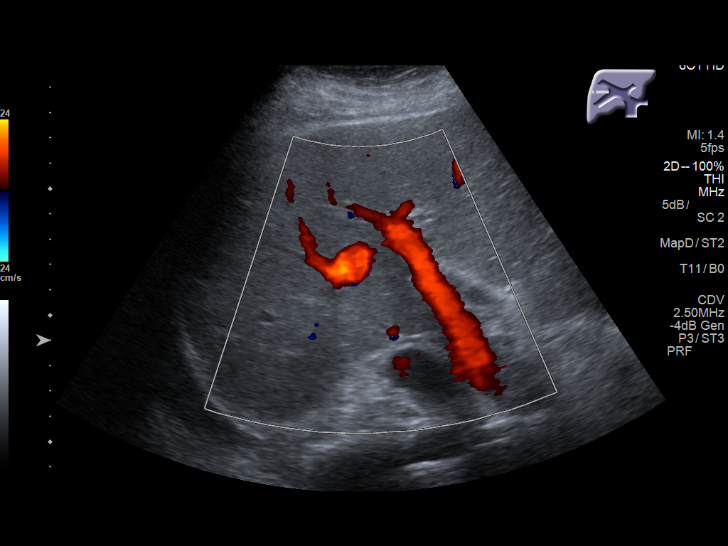
[im 33/50]
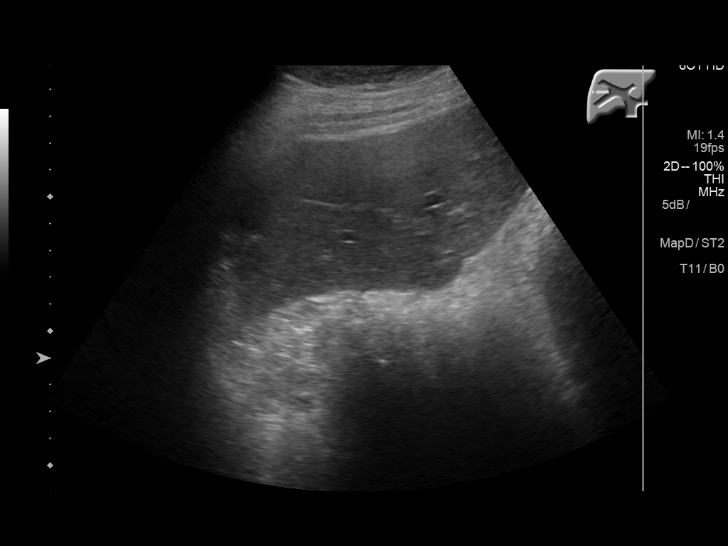
[im 37/50]
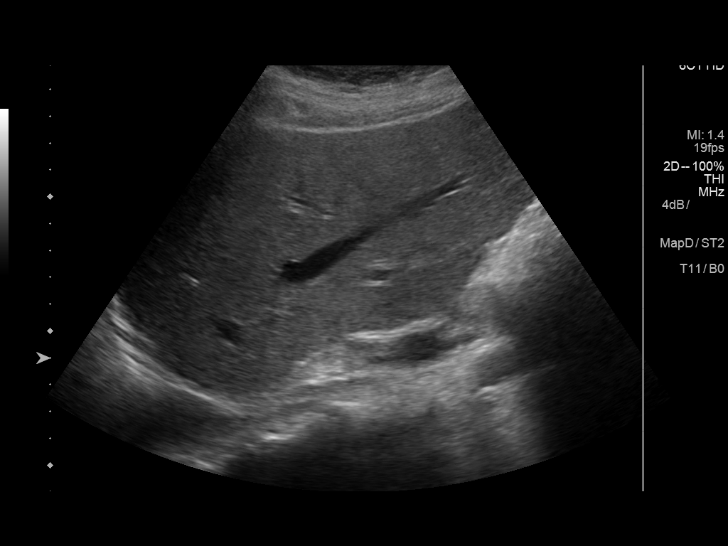
[im 41/50]
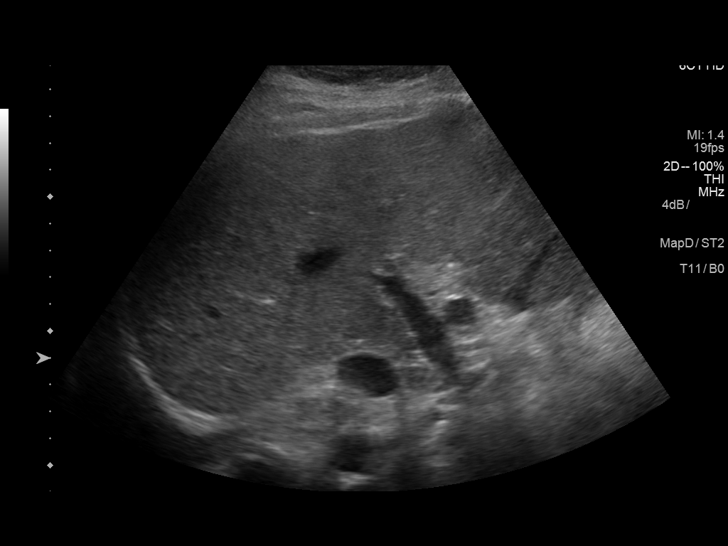
[im 45/50]
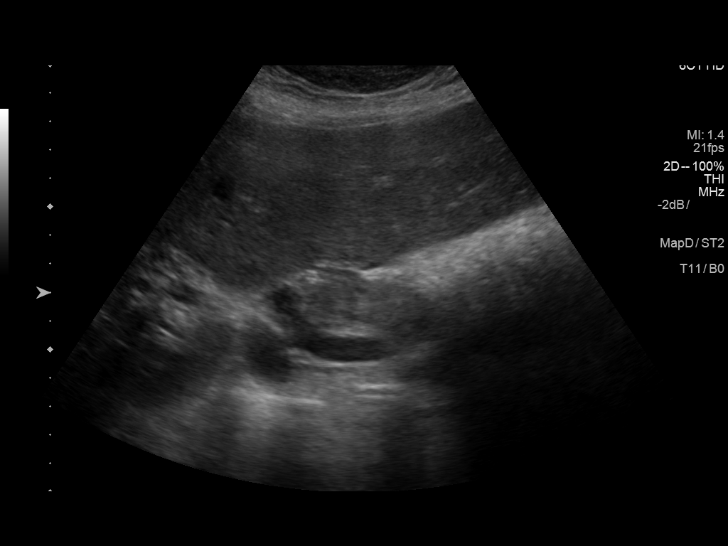
[im 50/50]
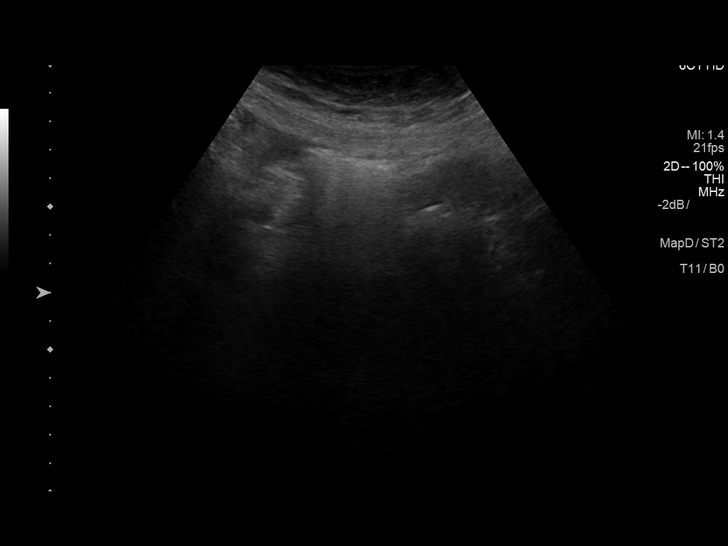

[14 of 25 positions shown; findings below may reference images not displayed]

FINDINGS: Gallbladder:

The gallbladder is surgically absent. There are no abnormal echoes
observed in the gallbladder fossa.

Common bile duct:

Diameter: 8.7 mm

Liver:

The hepatic echotexture is normal. The surface contour is normal.
There is no intrahepatic ductal dilation or focal mass. No ascites
is observed.
IMPRESSION: Normal limited right upper quadrant ultrasound examination.

## 2017-01-02 ENCOUNTER — Ambulatory Visit: Payer: Medicare Other | Admitting: Oncology

## 2017-01-03 IMAGING — US US CAROTID DUPLEX BILAT
1 series · 13 of 24 positions shown · non-contrast
Comparison: None.

CLINICAL DATA: TIAs

EXAM:
BILATERAL CAROTID DUPLEX ULTRASOUND
TECHNIQUE: Gray scale imaging, color Doppler and duplex ultrasound were
performed of bilateral carotid and vertebral arteries in the neck.

[Series 1: us carotid duplex bilat · 0.06mm/px · 13 of 66 slices shown]
[im 1/66]
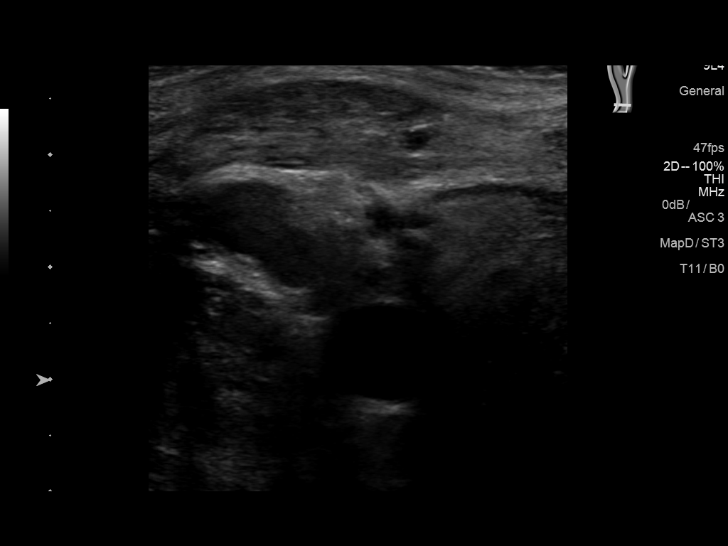
[im 6/66]
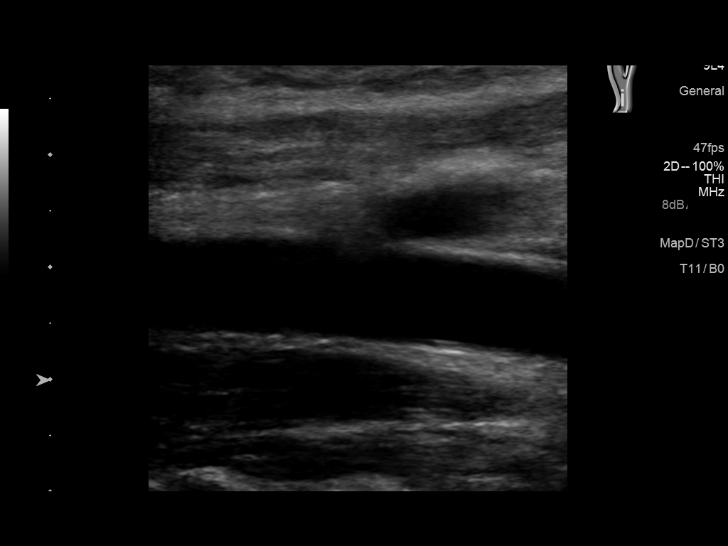
[im 12/66]
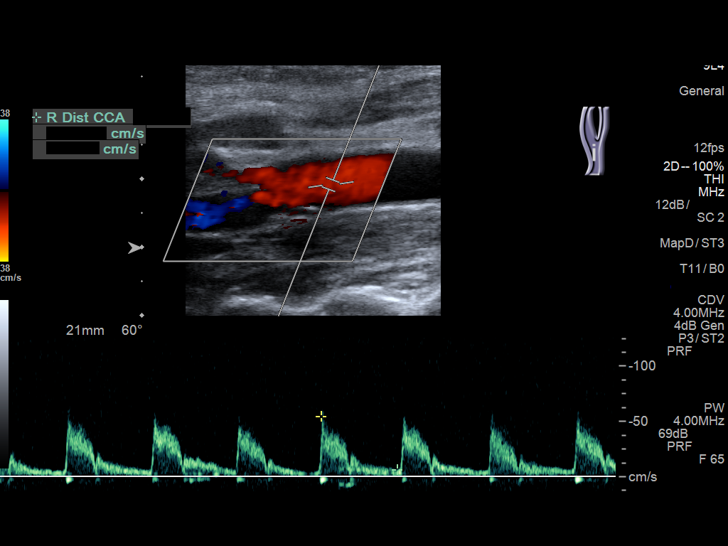
[im 17/66]
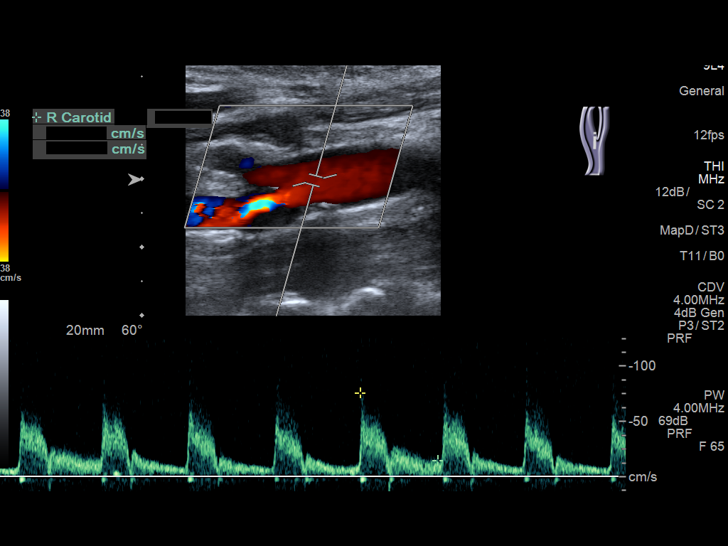
[im 23/66]
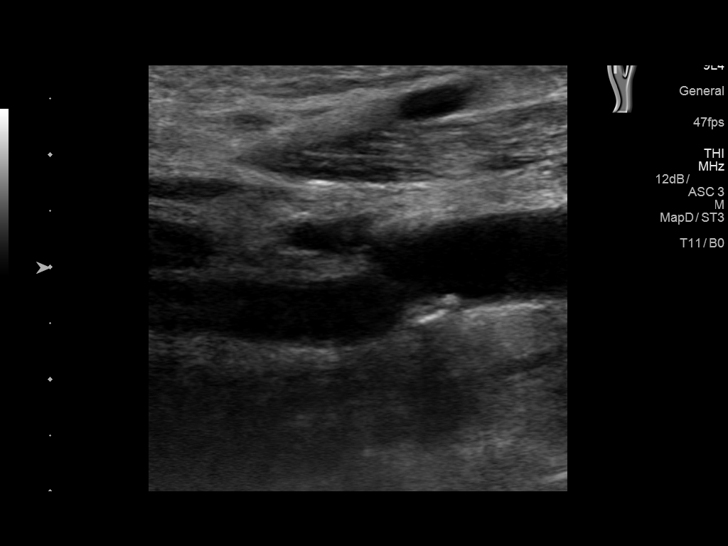
[im 29/66]
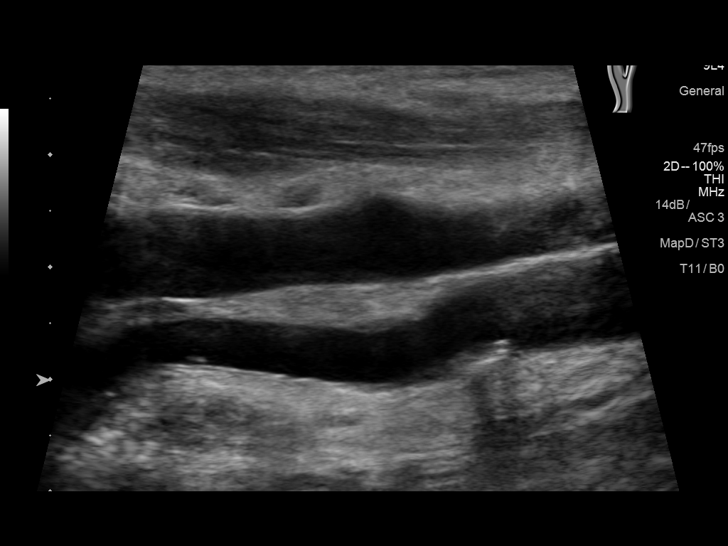
[im 34/66]
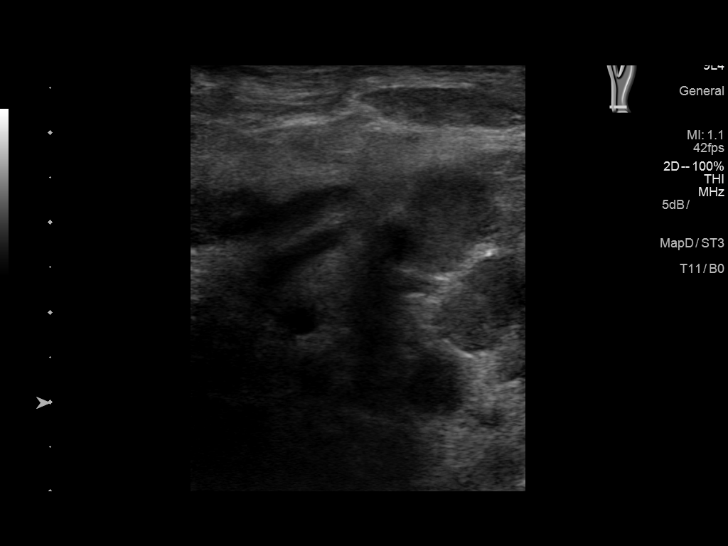
[im 37/66]
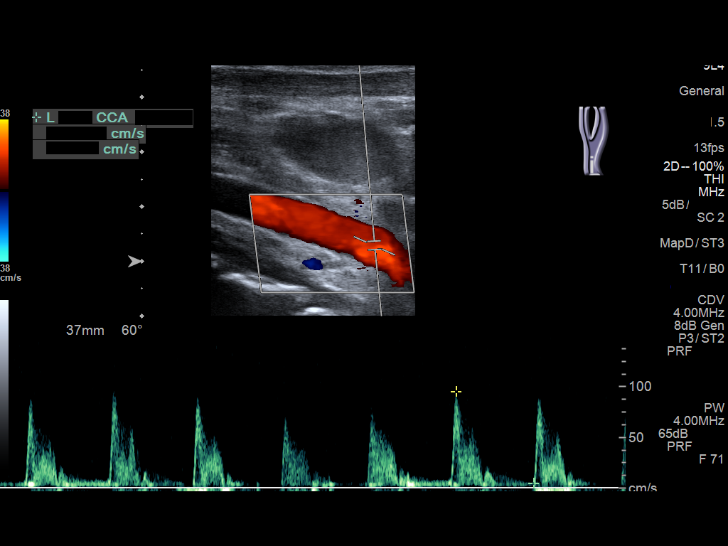
[im 43/66]
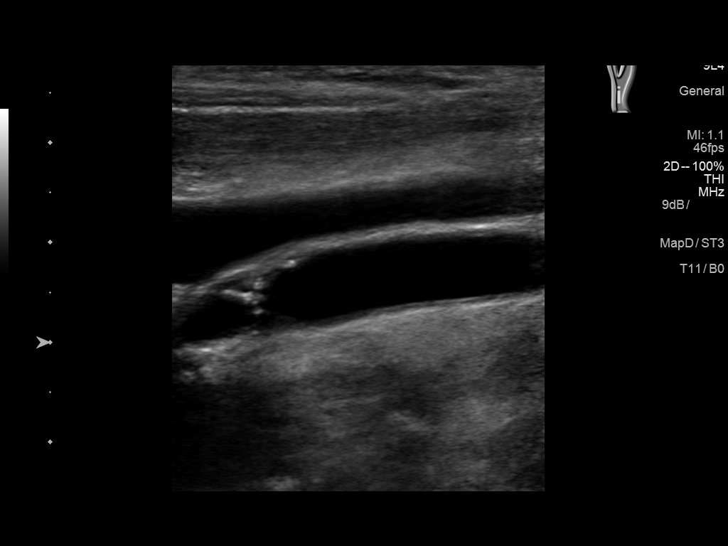
[im 49/66]
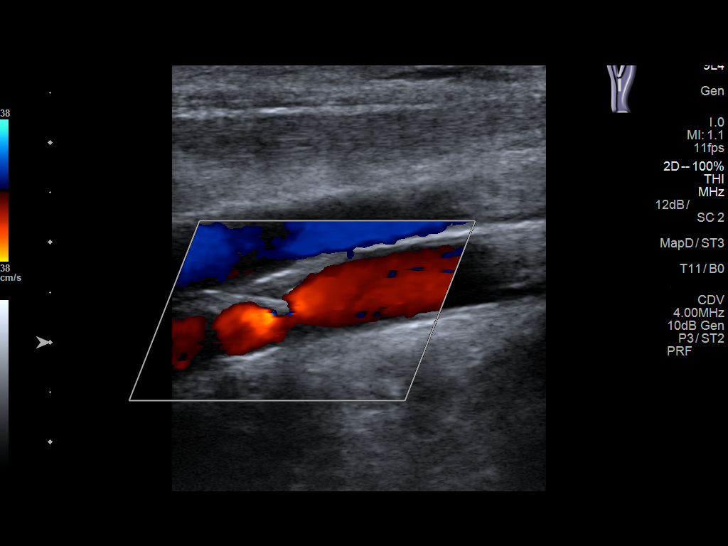
[im 54/66]
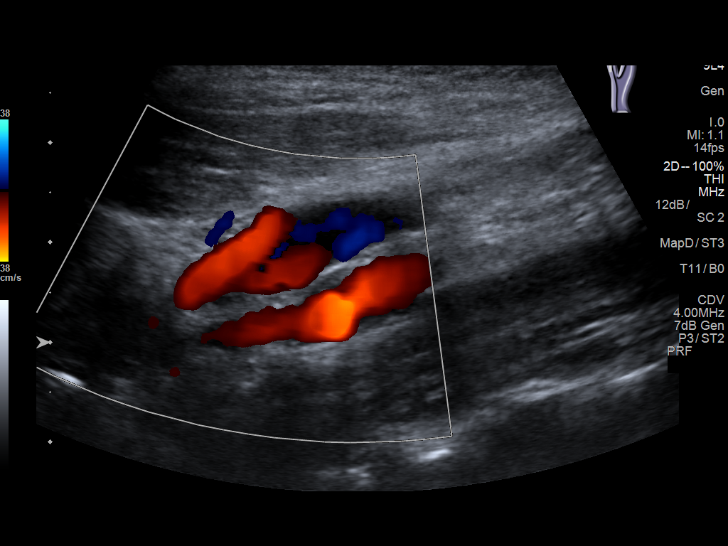
[im 60/66]
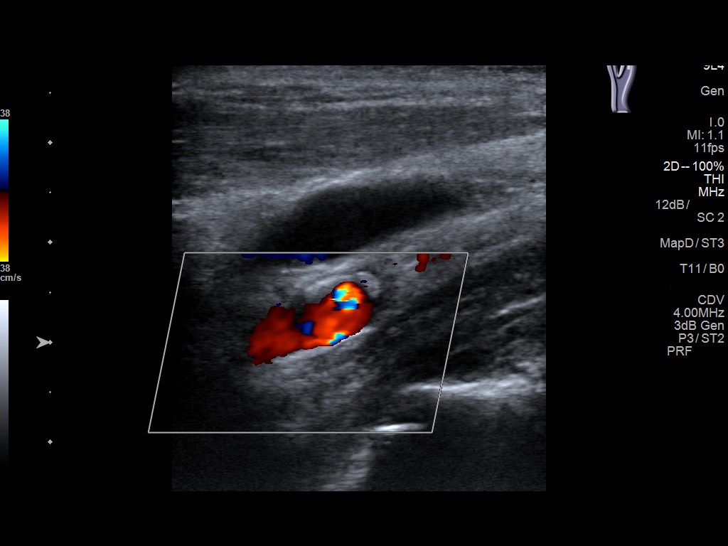
[im 66/66]
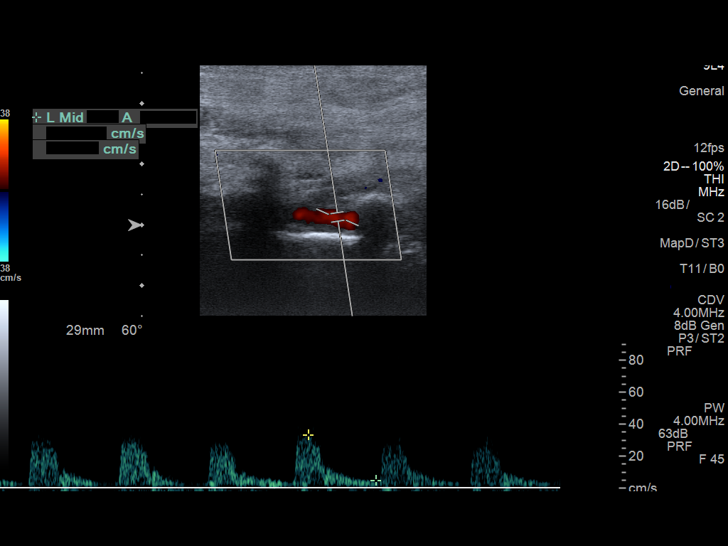

[13 of 24 positions shown; findings below may reference images not displayed]

FINDINGS: Criteria: Quantification of carotid stenosis is based on velocity
parameters that correlate the residual internal carotid diameter
with NASCET-based stenosis levels, using the diameter of the distal
internal carotid lumen as the denominator for stenosis measurement.

The following velocity measurements were obtained:

RIGHT

ICA:  357/67 cm/sec

CCA:  67/7 cm/sec

SYSTOLIC ICA/CCA RATIO:

DIASTOLIC ICA/CCA RATIO:

ECA:  113 cm/sec

LEFT

ICA:  188/16 cm/sec

CCA:  79/7 cm/sec

SYSTOLIC ICA/CCA RATIO:

DIASTOLIC ICA/CCA RATIO:

ECA:  113 cm/sec

RIGHT CAROTID ARTERY: Preliminary grayscale images demonstrate
significant calcific plaque in the region of the carotid bulb and
proximal internal carotid artery. The waveforms, velocities and flow
velocity ratios indicate a high-grade stenosis.

RIGHT VERTEBRAL ARTERY:  Antegrade in nature.

LEFT CAROTID ARTERY: Preliminary grayscale images demonstrate
atherosclerotic plaque in the carotid bulb and proximal internal
carotid artery. The waveforms, velocities and flow velocity ratios
show evidence of stenosis in the 50-69% range.

LEFT VERTEBRAL ARTERY:  Antegrade in nature.
IMPRESSION: Bilateral carotid stenosis much worse on the right than the left as
described. Vascular surgery consultation is recommended

## 2017-01-08 ENCOUNTER — Other Ambulatory Visit: Payer: Self-pay

## 2017-01-08 ENCOUNTER — Telehealth: Payer: Self-pay | Admitting: *Deleted

## 2017-01-08 ENCOUNTER — Emergency Department
Admission: EM | Admit: 2017-01-08 | Discharge: 2017-01-08 | Disposition: A | Discharge status: Home / Self Care | Payer: Medicare Other | Attending: Emergency Medicine | Admitting: Emergency Medicine

## 2017-01-08 ENCOUNTER — Inpatient Hospital Stay: Payer: Medicare Other | Attending: Oncology | Admitting: Oncology

## 2017-01-08 ENCOUNTER — Emergency Department: Payer: Medicare Other

## 2017-01-08 ENCOUNTER — Encounter: Payer: Self-pay | Admitting: Emergency Medicine

## 2017-01-08 ENCOUNTER — Encounter: Payer: Self-pay | Admitting: Oncology

## 2017-01-08 DIAGNOSIS — R1011 Right upper quadrant pain: Secondary | ICD-10-CM | POA: Diagnosis not present

## 2017-01-08 DIAGNOSIS — E1122 Type 2 diabetes mellitus with diabetic chronic kidney disease: Secondary | ICD-10-CM | POA: Diagnosis not present

## 2017-01-08 DIAGNOSIS — J449 Chronic obstructive pulmonary disease, unspecified: Secondary | ICD-10-CM | POA: Insufficient documentation

## 2017-01-08 DIAGNOSIS — J45909 Unspecified asthma, uncomplicated: Secondary | ICD-10-CM | POA: Insufficient documentation

## 2017-01-08 DIAGNOSIS — R109 Unspecified abdominal pain: Secondary | ICD-10-CM | POA: Diagnosis not present

## 2017-01-08 DIAGNOSIS — N186 End stage renal disease: Secondary | ICD-10-CM | POA: Diagnosis not present

## 2017-01-08 DIAGNOSIS — Z794 Long term (current) use of insulin: Secondary | ICD-10-CM | POA: Diagnosis not present

## 2017-01-08 DIAGNOSIS — I132 Hypertensive heart and chronic kidney disease with heart failure and with stage 5 chronic kidney disease, or end stage renal disease: Secondary | ICD-10-CM | POA: Diagnosis not present

## 2017-01-08 DIAGNOSIS — Z87891 Personal history of nicotine dependence: Secondary | ICD-10-CM | POA: Diagnosis not present

## 2017-01-08 DIAGNOSIS — Z7982 Long term (current) use of aspirin: Secondary | ICD-10-CM | POA: Insufficient documentation

## 2017-01-08 DIAGNOSIS — I5032 Chronic diastolic (congestive) heart failure: Secondary | ICD-10-CM | POA: Diagnosis not present

## 2017-01-08 DIAGNOSIS — I2510 Atherosclerotic heart disease of native coronary artery without angina pectoris: Secondary | ICD-10-CM | POA: Diagnosis not present

## 2017-01-08 DIAGNOSIS — R1013 Epigastric pain: Secondary | ICD-10-CM | POA: Insufficient documentation

## 2017-01-08 DIAGNOSIS — R112 Nausea with vomiting, unspecified: Secondary | ICD-10-CM | POA: Insufficient documentation

## 2017-01-08 DIAGNOSIS — Z79899 Other long term (current) drug therapy: Secondary | ICD-10-CM | POA: Insufficient documentation

## 2017-01-08 DIAGNOSIS — R110 Nausea: Secondary | ICD-10-CM | POA: Diagnosis not present

## 2017-01-08 DIAGNOSIS — D631 Anemia in chronic kidney disease: Secondary | ICD-10-CM | POA: Insufficient documentation

## 2017-01-08 LAB — CBC
HCT: 42.8 % (ref 35.0–47.0)
Hemoglobin: 13.9 g/dL (ref 12.0–16.0)
MCH: 31 pg (ref 26.0–34.0)
MCHC: 32.5 g/dL (ref 32.0–36.0)
MCV: 95.4 fL (ref 80.0–100.0)
Platelets: 161 10*3/uL (ref 150–440)
RBC: 4.48 MIL/uL (ref 3.80–5.20)
RDW: 16.2 % — ABNORMAL HIGH (ref 11.5–14.5)
WBC: 6.7 10*3/uL (ref 3.6–11.0)

## 2017-01-08 LAB — COMPREHENSIVE METABOLIC PANEL
ALT: 14 U/L (ref 14–54)
AST: 27 U/L (ref 15–41)
Albumin: 3.9 g/dL (ref 3.5–5.0)
Alkaline Phosphatase: 142 U/L — ABNORMAL HIGH (ref 38–126)
Anion gap: 13 (ref 5–15)
BUN: 43 mg/dL — ABNORMAL HIGH (ref 6–20)
CO2: 28 mmol/L (ref 22–32)
Calcium: 9.7 mg/dL (ref 8.9–10.3)
Chloride: 94 mmol/L — ABNORMAL LOW (ref 101–111)
Creatinine, Ser: 5.22 mg/dL — ABNORMAL HIGH (ref 0.44–1.00)
GFR calc Af Amer: 10 mL/min — ABNORMAL LOW (ref >60)
GFR calc non Af Amer: 8 mL/min — ABNORMAL LOW (ref >60)
Glucose, Bld: 300 mg/dL — ABNORMAL HIGH (ref 65–99)
Potassium: 4.5 mmol/L (ref 3.5–5.1)
Sodium: 135 mmol/L (ref 135–145)
Total Bilirubin: 0.8 mg/dL (ref 0.3–1.2)
Total Protein: 8.6 g/dL — ABNORMAL HIGH (ref 6.5–8.1)

## 2017-01-08 LAB — TROPONIN I: Troponin I: <0.03 ng/mL (ref <0.03)

## 2017-01-08 LAB — LIPASE, BLOOD: Lipase: 26 U/L (ref 11–51)

## 2017-01-08 MED ORDER — HALOPERIDOL LACTATE 5 MG/ML IJ SOLN
INTRAMUSCULAR | Status: AC
  Administered 2017-01-08: 5 mg via INTRAMUSCULAR
  Filled 2017-01-08: qty 1

## 2017-01-08 MED ORDER — MORPHINE SULFATE (PF) 4 MG/ML IV SOLN
4.0000 mg | Freq: Once | INTRAVENOUS | Status: AC
  Administered 2017-01-08: 4 mg via INTRAMUSCULAR
  Filled 2017-01-08: qty 1

## 2017-01-08 MED ORDER — PROMETHAZINE HCL 25 MG PO TABS
12.5000 mg | ORAL_TABLET | Freq: Once | ORAL | Status: AC
  Administered 2017-01-08: 12.5 mg via ORAL
  Filled 2017-01-08: qty 1

## 2017-01-08 MED ORDER — HALOPERIDOL LACTATE 5 MG/ML IJ SOLN
5.0000 mg | Freq: Once | INTRAMUSCULAR | Status: AC
  Administered 2017-01-08: 5 mg via INTRAMUSCULAR

## 2017-01-08 NOTE — ED Notes (Signed)
Pt placed in recliner in subwait, pillow and blanket provided.

## 2017-01-08 NOTE — Progress Notes (Signed)
Patient was complaining of nausea and abdominal pain. Says she has had this before and did not want to to ER. I have not assessed this patient for abnormal SPEP and we will reschedule her visit. Patient just wants to go home and rest. We will inform her pcp about this as well.  Dr. Randa Evens, MD, MPH [place] [facility] [phone] 01/08/2017 2:26 PM

## 2017-01-08 NOTE — Progress Notes (Signed)
Here as new pt

## 2017-01-08 NOTE — ED Triage Notes (Signed)
Pt in via EMS from home with complaints of sudden onset RUQ abdominal pain w/ N/V, denies any diarrhea.  Pt moaning loudly during triage, vitals WDL.  Pt dry heaving in triage room, reports allergy to zofran when offered to her.

## 2017-01-08 NOTE — Progress Notes (Deleted)
Hematology/Oncology Consult note [facility] Telephone:([phone] [phone]  Patient Care Team: Ellamae Sia, MD as PCP - General (Internal Medicine)   Name of the patient: [name]  [id number]  [birth year]    Reason for referral- M spike noted on urine   Referring physician- Dr. Lyndel Pleasure  Date of visit: 01/08/17   History of presenting illness- patient is a 56 year old female with a history of end-stage renal disease and has been on hemodialysis for the last. Her other past medical history is significant for hypertension, hyperlipidemia, type 2 diabetes with neuropathy and coronary artery disease as well as osteoarthritis. She was seen in by Dr.BehalalMeda Coffee from rheumatology for evaluation of possible lupus. She has had uterine and blood work done in the past which showed 4.2% monoclonal protein in her urine for total protein of 108 mg in 24 hours. No serum M spike was noted. Patient has been sent to [place] for further evaluation. Patient's most recent CBC from 12/05/2016 was within normal limits and her H&H was 12.2/37.1. CMP was significant for elevated BUN of 18 and creatinine of 4.14. LFTs were within normal limits except for mildly elevated alkaline phosphatase of 160.  ECOG PS- ***  Pain scale- ***   Review of systems- Review of Systems  Constitutional: Negative for chills, fever, malaise/fatigue and weight loss.  HENT: Negative for congestion, ear discharge and nosebleeds.   Eyes: Negative for blurred vision.  Respiratory: Negative for cough, hemoptysis, sputum production, shortness of breath and wheezing.   Cardiovascular: Negative for chest pain, palpitations, orthopnea and claudication.  Gastrointestinal: Negative for abdominal pain, blood in stool, constipation, diarrhea, heartburn, melena, nausea and vomiting.  Genitourinary: Negative for dysuria, flank pain, frequency, hematuria and urgency.  Musculoskeletal: Negative for back pain,  joint pain and myalgias.  Skin: Negative for rash.  Neurological: Negative for dizziness, tingling, focal weakness, seizures, weakness and headaches.  Endo/Heme/Allergies: Does not bruise/bleed easily.  Psychiatric/Behavioral: Negative for depression and suicidal ideas. The patient does not have insomnia.     Allergies  Allergen Reactions  . Ace Inhibitors Swelling and Anaphylaxis  . Compazine [Prochlorperazine Edisylate] Anaphylaxis and Nausea And Vomiting    Other reaction(s): dystonia from this vs. reglan 23 Jul - patient relates that she takes promethazine frequently with no problems. 23 Jul - patient relates that she takes promethazine frequently with no problems.  . Sumatriptan Succinate     Other reaction(s): delirium and hallucinations per [facility] records  . Ativan [Lorazepam] Other (See Comments)    Reaction:Hallucinations and headaches Reaction:Hallucinations and headaches Reaction:  Hallucinations and headaches  . Dilaudid [Hydromorphone Hcl] Other (See Comments)    Delirium Delirium  . Gabapentin Other (See Comments)    Reaction:  Unknown  Reaction:  Unknown   . Losartan Other (See Comments)    Reaction:  Unknown   . Ondansetron Other (See Comments)    Reaction:  Unknown  Reaction:  Unknown   . Prochlorperazine Other (See Comments)    Reaction:  Unknown  Reaction:  Unknown   . Reglan [Metoclopramide]     Per patient her Dr. Evelina Bucy her off it  Per patient her Dr. Evelina Bucy her off it   . Scopolamine Other (See Comments)    Reaction:  Unknown   . Zofran [Ondansetron Hcl] Other (See Comments)    Reaction:  hallucinations  Reaction:  hallucinations   . Codeine Nausea And Vomiting  . [place] Nausea And Vomiting  . Oxycodone Anxiety  . Tape  Rash  . Tapentadol Rash    Patient Active Problem List   Diagnosis Date Noted  . Cellulitis of lower extremity 07/29/2016  . Chronic venous insufficiency 07/29/2016  . Lymphedema 07/29/2016  . TIA (transient ischemic attack)  04/21/2016  . Altered mental status 04/08/2016  . Hyperammonemia ([place]) 04/08/2016  . Elevated troponin 04/08/2016  . ESRD (end stage renal disease) ([place]) 04/08/2016  . Depression 04/08/2016  . Depression, major, recurrent, severe with psychosis ([place]) 04/08/2016  . Blood in stool   . Intractable cyclical vomiting with nausea   . Reflux esophagitis   . Gastritis   . Generalized abdominal pain   . Uncontrollable vomiting   . Major depressive disorder, recurrent episode, moderate ([name]) 03/15/2016  . Adjustment disorder with mixed anxiety and depressed mood 03/15/2016  . Malnutrition of moderate degree 12/01/2015  . Renal mass   . Dyspnea   . Acute renal failure ([place])   . Respiratory failure ([place])   . High temperature 11/14/2015  . Pulmonary edema   . Encounter for central line placement   . Encounter for orogastric (OG) tube placement   . Nausea 11/12/2015  . Hyperkalemia 10/03/2015  . Diarrhea, unspecified 07/22/2015  . Pneumonia 05/21/2015  . Hypoglycemia 04/24/2015  . Unresponsiveness 04/24/2015  . Bradycardia 04/24/2015  . Hypothermia 04/24/2015  . Acute respiratory failure ([place]) 04/24/2015  . Acute diastolic CHF (congestive heart failure) ([place]) 04/05/2015  . Diabetic gastroparesis ([place]) 04/05/2015  . Hypokalemia 04/05/2015  . Generalized weakness 04/05/2015  . Acute pulmonary edema ([place]) 04/03/2015  . Nausea and vomiting 04/03/2015  . Hypoglycemia associated with diabetes ([place]) 04/03/2015  . Anemia of chronic disease 04/03/2015  . Secondary hyperparathyroidism ([place]) 04/03/2015  . Pressure ulcer 04/02/2015  . Acute respiratory failure with hypoxia ([place]) 04/01/2015  . Adjustment disorder with anxiety 03/14/2015  . Somatic symptom disorder, mild 03/08/2015  . Coronary artery disease involving native coronary artery of native heart without angina pectoris   . Nausea & vomiting 03/06/2015  . Abdominal pain 03/06/2015  . DM (diabetes mellitus) ([place]) 03/06/2015  . HTN  (hypertension) 03/06/2015  . Gastroparesis 02/24/2015  . Pleural effusion 02/19/2015  . HCAP (healthcare-associated pneumonia) 02/19/2015  . End-stage renal disease on hemodialysis ([place]) 02/19/2015     Past Medical History:  Diagnosis Date  . Anemia   . Anxiety   . Arthritis   . Asthma   . chronic diastolic CHF 6/96/2952  . COPD (chronic obstructive pulmonary disease) ([place])   . Coronary artery disease    a. cath 2013: stenting to RCA (report not available); b. cath 2014: LM nl, pLAD 40%, mLAD nl, ost LCx 40%, mid LCx nl, pRCA 30% @ site of prior stent, mRCA 50%  . Depression   . Diabetes mellitus without complication ([place])   . Diabetic neuropathy ([place])   . dialysis 2006  . ESRD (end stage renal disease) on dialysis ([place])    M-W-F  . GERD (gastroesophageal reflux disease)   . Headache   . Hx of pancreatitis 2015  . Hypertension   . Mitral regurgitation    a. echo 10/2013: EF 62%, noWMA, mildly dilated LA, mild to mod MR/TR, GR1DD  . Myocardial infarction   . Pneumonia   . Renal insufficiency    Pt is on dialysis on M,W + F.     Past Surgical History:  Procedure Laterality Date  . ABDOMINAL HYSTERECTOMY    . APPENDECTOMY    . CARDIAC CATHETERIZATION Left 07/26/2015   Procedure: Left Heart  Cath and Coronary Angiography;  Surgeon: Dionisio David, MD;  Location: [place] CV LAB;  Service: Cardiovascular;  Laterality: Left;  . CHOLECYSTECTOMY    . COLONOSCOPY WITH PROPOFOL N/A 08/12/2016   Procedure: COLONOSCOPY WITH PROPOFOL;  Surgeon: Lollie Sails, MD;  Location: [facility] ENDOSCOPY;  Service: Endoscopy;  Laterality: N/A;  . DIALYSIS FISTULA CREATION Left    upper arm  . ESOPHAGOGASTRODUODENOSCOPY N/A 03/08/2015   Procedure: ESOPHAGOGASTRODUODENOSCOPY (EGD);  Surgeon: Manya Silvas, MD;  Location[facility] ENDOSCOPY;  Service: Endoscopy;  Laterality: N/A;  . ESOPHAGOGASTRODUODENOSCOPY (EGD) WITH PROPOFOL N/A 03/18/2016   Procedure: ESOPHAGOGASTRODUODENOSCOPY (EGD) WITH  PROPOFOL;  Surgeon: [name], MD;  Location: [facility] ENDOSCOPY;  Service: Endoscopy;  Laterality: N/A;  . EYE SURGERY    . FECAL TRANSPLANT N/A 08/23/2015   Procedure: FECAL TRANSPLANT;  Surgeon: Manya Silvas, MD;  Location: [facility] ENDOSCOPY;  Service: Endoscopy;  Laterality: N/A;  . PERIPHERAL VASCULAR CATHETERIZATION N/A 12/20/2015   Procedure: Thrombectomy of dialysis access versus permcath placement;  Surgeon: Algernon Huxley, MD;  Location: [place] CV LAB;  Service: Cardiovascular;  Laterality: N/A;  . PERIPHERAL VASCULAR CATHETERIZATION N/A 12/20/2015   Procedure: A/V Shunt Intervention;  Surgeon: Algernon Huxley, MD;  Location: [place] CV LAB;  Service: Cardiovascular;  Laterality: N/A;  . PERIPHERAL VASCULAR CATHETERIZATION N/A 12/20/2015   Procedure: A/V Shuntogram/Fistulagram;  Surgeon: Algernon Huxley, MD;  Location: [place] CV LAB;  Service: Cardiovascular;  Laterality: N/A;  . PERIPHERAL VASCULAR CATHETERIZATION N/A 01/02/2016   Procedure: A/V Shuntogram/Fistulagram;  Surgeon: Algernon Huxley, MD;  Location: [place] CV LAB;  Service: Cardiovascular;  Laterality: N/A;  . PERIPHERAL VASCULAR CATHETERIZATION N/A 01/02/2016   Procedure: A/V Shunt Intervention;  Surgeon: Algernon Huxley, MD;  Location: [place] CV LAB;  Service: Cardiovascular;  Laterality: N/A;    Social History   Social History  . Marital status: Divorced    Spouse name: N/A  . Number of children: N/A  . Years of education: N/A   Occupational History  . Not on file.   Social History Main Topics  . Smoking status: Former Smoker    Packs/day: 0.50    Types: Cigarettes    Quit date: 02/13/2015  . Smokeless tobacco: Never Used  . Alcohol use No  . Drug use: No  . Sexual activity: Not on file   Other Topics Concern  . Not on file   Social History Narrative  . No narrative on file     Family History  Problem Relation Age of Onset  . Kidney disease Mother   . Diabetes Mother      Current  Outpatient Prescriptions:  .  albuterol (PROVENTIL HFA;VENTOLIN HFA) 108 (90 Base) MCG/ACT inhaler, Inhale 2 puffs into the lungs every 6 (six) hours as needed for shortness of breath. , Disp: , Rfl:  .  amLODipine (NORVASC) 10 MG tablet, Take 10 mg by mouth daily., Disp: , Rfl:  .  aspirin EC 81 MG tablet, Take 81 mg by mouth daily., Disp: , Rfl:  .  atorvastatin (LIPITOR) 40 MG tablet, Take 40 mg by mouth at bedtime. , Disp: , Rfl:  .  cetirizine (ZYRTEC) 10 MG tablet, Take 10 mg by mouth daily. , Disp: , Rfl:  .  FLUoxetine (PROZAC) 20 MG capsule, Take 1 capsule (20 mg total) by mouth daily. (Patient taking differently: Take 20 mg by mouth at bedtime. ), Disp: 30 capsule, Rfl: 0 .  glucose 4 GM chewable  tablet, Chew 3-4 tablets by mouth as needed for low blood sugar., Disp: , Rfl:  .  hydrALAZINE (APRESOLINE) 25 MG tablet, Take 25 mg by mouth 3 (three) times daily., Disp: , Rfl:  .  HYDROcodone-acetaminophen (NORCO/VICODIN) 5-325 MG tablet, Take 1 tablet by mouth every 6 (six) hours as needed for moderate pain or severe pain., Disp: 15 tablet, Rfl: 0 .  insulin aspart (NOVOLOG) 100 UNIT/ML injection, Inject 0-6 Units into the skin 3 (three) times daily before meals. Per sliding scale, Disp: , Rfl:  .  insulin detemir (LEVEMIR) 100 UNIT/ML injection, Inject 6 Units into the skin at bedtime., Disp: , Rfl:  .  lipase/protease/amylase (CREON) 12000 UNITS CPEP capsule, Take 12,000-36,000 Units by mouth 3 (three) times daily with meals. Take 36000 mg by mouth three times daily with meals and 12000 mg with snacks., Disp: , Rfl:  .  meclizine (ANTIVERT) 12.5 MG tablet, Take 1 tablet (12.5 mg total) by mouth 3 (three) times daily as needed for dizziness., Disp: 30 tablet, Rfl: 0 .  metoprolol (LOPRESSOR) 50 MG tablet, Take 50 mg by mouth 2 (two) times daily., Disp: , Rfl:  .  multivitamin (RENA-VIT) TABS tablet, Take 1 tablet by mouth every evening. , Disp: , Rfl:  .  nitroGLYCERIN (NITROSTAT) 0.4 MG SL  tablet, Place 0.4 mg under the tongue every 5 (five) minutes x 3 doses as needed., Disp: , Rfl:  .  OLANZapine (ZYPREXA) 5 MG tablet, Take 0.5 tablets (2.5 mg total) by mouth at bedtime. (Patient taking differently: Take 5 mg by mouth at bedtime. ), Disp: 30 tablet, Rfl: 0 .  omega-3 acid ethyl esters (LOVAZA) 1 g capsule, Take 1 g by mouth 2 (two) times daily. , Disp: , Rfl:  .  pantoprazole (PROTONIX) 40 MG tablet, Take 1 tablet (40 mg total) by mouth 2 (two) times daily before a meal., Disp: 60 tablet, Rfl: 2 .  pregabalin (LYRICA) 25 MG capsule, Take 25 mg by mouth daily., Disp: , Rfl:  .  promethazine (PHENERGAN) 25 MG suppository, Place 1 suppository (25 mg total) rectally every 6 (six) hours as needed for nausea. Stop taking the oral promethazine (phenergan).  Use these suppositories instead. (Patient not taking: Reported on 12/05/2016), Disp: 12 suppository, Rfl: 0 .  promethazine (PHENERGAN) 25 MG suppository, Place 1 suppository (25 mg total) rectally every 6 (six) hours as needed for nausea. (Patient not taking: Reported on 12/05/2016), Disp: 12 suppository, Rfl: 0 .  promethazine (PHENERGAN) 25 MG tablet, Take 1 tablet (25 mg total) by mouth every 6 (six) hours as needed for nausea or vomiting., Disp: 10 tablet, Rfl: 0 .  ranitidine (ZANTAC) 150 MG tablet, Take 150 mg by mouth daily. In the afternoon, Disp: , Rfl:  .  ranolazine (RANEXA) 1000 MG SR tablet, Take 1,000 mg by mouth 2 (two) times daily., Disp: , Rfl:  .  sevelamer carbonate (RENVELA) 800 MG tablet, Take 800 mg by mouth 3 (three) times daily with meals. , Disp: , Rfl:    Physical exam:  Vitals:   01/08/17 1400  BP: (!) 163/76  Pulse: 82  Resp: 18  Temp: 97.1 F (36.2 C)  TempSrc: Tympanic  Weight: 175 lb 8 oz (79.6 kg)  Height: 5' 6.14" (1.68 m)   Physical Exam  Constitutional: She is oriented to person, place, and time and well-developed, well-nourished, and in no distress.  HENT:  Head: Normocephalic and  atraumatic.  Eyes: EOM are normal. Pupils are equal, round, and reactive  to light.  Neck: Normal range of motion.  Cardiovascular: Normal rate, regular rhythm and normal heart sounds.   Pulmonary/Chest: Effort normal and breath sounds normal.  Abdominal: Soft. Bowel sounds are normal.  Neurological: She is alert and oriented to person, place, and time.  Skin: Skin is warm and dry.       CMP Latest Ref Rng & Units 12/05/2016  Glucose 65 - 99 mg/dL 166(H)  BUN 6 - 20 mg/dL 18  Creatinine 0.44 - 1.00 mg/dL 4.14(H)  Sodium 135 - 145 mmol/L 134(L)  Potassium 3.5 - 5.1 mmol/L 4.0  Chloride 101 - 111 mmol/L 93(L)  CO2 22 - 32 mmol/L 27  Calcium 8.9 - 10.3 mg/dL 9.0  Total Protein 6.5 - 8.1 g/dL 8.4(H)  Total Bilirubin 0.3 - 1.2 mg/dL 0.9  Alkaline Phos 38 - 126 U/L 160(H)  AST 15 - 41 U/L 39  ALT 14 - 54 U/L 22   CBC Latest Ref Rng & Units 12/05/2016  WBC 3.6 - 11.0 K/uL 6.5  Hemoglobin 12.0 - 16.0 g/dL 12.2  Hematocrit 35.0 - 47.0 % 37.1  Platelets 150 - 440 K/uL 207    No images are attached to the encounter.  No results found.  Assessment and plan- Patient is a 56 y.o. female who has been referred to [place] for M spike noted in her urine  Today I will obtain a repeat CBC, CMP, myeloma panel, serum free light chains, 24-hour urine protein and urine protein electrophoresis. I will also obtain baseline skeletal survey. I will see the patient back in 2 weeks' time to discuss the results of her blood work. Although patient had M spike in her urine, it was a very small amount of monoclonal spike of ~5 mg given that total urine protein was measured at 108 mg.    Thank you for this kind referral and the opportunity to participate in the care of this patient   Visit Diagnosis No diagnosis found.  Dr. Randa Evens, MD, MPH [place] at [facility] Pager- [phone] 01/08/2017

## 2017-01-08 NOTE — Telephone Encounter (Signed)
Patient  Here and checked into be seen by provider and nurse had her ready. Patient states that she is hurting in her stomach and she is nauseated. She states she is having pancreatitis attack and she has had them since her kid was 78 and now her kid in in her last thirties.  She had her last one about a year ago. I asked as well as Dr. Janese Banks , and chris asked her if we could take her to ER to see if she can get evaluated.  She states that all they do is give her pain med and nausea med. She states that if she goes home and lays down it will get better. I offered that if she went to ER they can evaluate her and give her IV pain and nausea med. And she does not want to go. I asked if she came with someone today and she was brought by [facility] and I asked her if she is safe to travel back home with no one with her and she states yes. They always help her in and out of van. She wants to go home and refuses to go to ER at all. I called [facility] and told them she is not feeling well and did not want to be seen because she feels like she is having a pancreatitis attack. They will come to get her asap.  I Kelby Fam to r/s her in 2 weeks and it has to be tues or thurs. Because she has dialysis the other days of the week. I took her down to wait for [facility] and told [name] Landry the [place] person to call me if he needs me or if pt changes her mind and wants to go to ER

## 2017-01-08 NOTE — Progress Notes (Deleted)
Hematology/Oncology Consult note [facility] [phone] [phone]  Patient Care Team: Ellamae Sia, MD as PCP - General (Internal Medicine)   Name of the patient: [name]  [id number]  June 23, 1961    Reason for referral- M spike noted on urine   Referring physician- Dr. Lyndel Pleasure  Date of visit: 01/08/17   History of presenting illness- patient is a 56 year old female with a history of end-stage renal disease and has been on hemodialysis for the last. Her other past medical history is significant for hypertension, hyperlipidemia, type 2 diabetes with neuropathy and coronary artery disease as well as osteoarthritis. She was seen in by Dr.BehalalMeda Coffee from rheumatology for evaluation of possible lupus. She has had uterine and blood work done in the past which showed 4.2% monoclonal protein in her urine for total protein of 108 mg in 24 hours. No serum M spike was noted. Patient has been sent to [place] for further evaluation. Patient's most recent CBC from 12/05/2016 was within normal limits and her H&H was 12.2/37.1. CMP was significant for elevated BUN of 18 and creatinine of 4.14. LFTs were within normal limits except for mildly elevated alkaline phosphatase of 160.  ECOG PS- ***  Pain scale- ***   Review of systems- Review of Systems  Constitutional: Negative for chills, fever, malaise/fatigue and weight loss.  HENT: Negative for congestion, ear discharge and nosebleeds.   Eyes: Negative for blurred vision.  Respiratory: Negative for cough, hemoptysis, sputum production, shortness of breath and wheezing.   Cardiovascular: Negative for chest pain, palpitations, orthopnea and claudication.  Gastrointestinal: Negative for abdominal pain, blood in stool, constipation, diarrhea, heartburn, melena, nausea and vomiting.  Genitourinary: Negative for dysuria, flank pain, frequency, hematuria and urgency.  Musculoskeletal: Negative for back pain,  joint pain and myalgias.  Skin: Negative for rash.  Neurological: Negative for dizziness, tingling, focal weakness, seizures, weakness and headaches.  Endo/Heme/Allergies: Does not bruise/bleed easily.  Psychiatric/Behavioral: Negative for depression and suicidal ideas. The patient does not have insomnia.     Allergies  Allergen Reactions  . Ace Inhibitors Swelling and Anaphylaxis  . Compazine [Prochlorperazine Edisylate] Anaphylaxis and Nausea And Vomiting    Other reaction(s): dystonia from this vs. reglan 23 Jul - patient relates that she takes promethazine frequently with no problems. 23 Jul - patient relates that she takes promethazine frequently with no problems.  . Sumatriptan Succinate     Other reaction(s): delirium and hallucinations per [facility] records  . Ativan [Lorazepam] Other (See Comments)    Reaction:Hallucinations and headaches Reaction:Hallucinations and headaches Reaction:  Hallucinations and headaches  . Dilaudid [Hydromorphone Hcl] Other (See Comments)    Delirium Delirium  . Gabapentin Other (See Comments)    Reaction:  Unknown  Reaction:  Unknown   . Losartan Other (See Comments)    Reaction:  Unknown   . Ondansetron Other (See Comments)    Reaction:  Unknown  Reaction:  Unknown   . Prochlorperazine Other (See Comments)    Reaction:  Unknown  Reaction:  Unknown   . Reglan [Metoclopramide]     Per patient her Dr. Evelina Bucy her off it  Per patient her Dr. Evelina Bucy her off it   . Scopolamine Other (See Comments)    Reaction:  Unknown   . Zofran [Ondansetron Hcl] Other (See Comments)    Reaction:  hallucinations  Reaction:  hallucinations   . Codeine Nausea And Vomiting  . [place] Nausea And Vomiting  . Oxycodone Anxiety  . Tape  Rash  . Tapentadol Rash    Patient Active Problem List   Diagnosis Date Noted  . Cellulitis of lower extremity 07/29/2016  . Chronic venous insufficiency 07/29/2016  . Lymphedema 07/29/2016  . TIA (transient ischemic attack)  04/21/2016  . Altered mental status 04/08/2016  . Hyperammonemia ([place]) 04/08/2016  . Elevated troponin 04/08/2016  . ESRD (end stage renal disease) ([place]) 04/08/2016  . Depression 04/08/2016  . Depression, major, recurrent, severe with psychosis ([place]) 04/08/2016  . Blood in stool   . Intractable cyclical vomiting with nausea   . Reflux esophagitis   . Gastritis   . Generalized abdominal pain   . Uncontrollable vomiting   . Major depressive disorder, recurrent episode, moderate ([place]) 03/15/2016  . Adjustment disorder with mixed anxiety and depressed mood 03/15/2016  . Malnutrition of moderate degree 12/01/2015  . Renal mass   . Dyspnea   . Acute renal failure ([place])   . Respiratory failure ([place])   . High temperature 11/14/2015  . Pulmonary edema   . Encounter for central line placement   . Encounter for orogastric (OG) tube placement   . Nausea 11/12/2015  . Hyperkalemia 10/03/2015  . Diarrhea, unspecified 07/22/2015  . Pneumonia 05/21/2015  . Hypoglycemia 04/24/2015  . Unresponsiveness 04/24/2015  . Bradycardia 04/24/2015  . Hypothermia 04/24/2015  . Acute respiratory failure ([place]) 04/24/2015  . Acute diastolic CHF (congestive heart failure) ([place]) 04/05/2015  . Diabetic gastroparesis ([place]) 04/05/2015  . Hypokalemia 04/05/2015  . Generalized weakness 04/05/2015  . Acute pulmonary edema ([place]) 04/03/2015  . Nausea and vomiting 04/03/2015  . Hypoglycemia associated with diabetes ([place]) 04/03/2015  . Anemia of chronic disease 04/03/2015  . Secondary hyperparathyroidism ([place]) 04/03/2015  . Pressure ulcer 04/02/2015  . Acute respiratory failure with hypoxia ([place]) 04/01/2015  . Adjustment disorder with anxiety 03/14/2015  . Somatic symptom disorder, mild 03/08/2015  . Coronary artery disease involving native coronary artery of native heart without angina pectoris   . Nausea & vomiting 03/06/2015  . Abdominal pain 03/06/2015  . DM (diabetes mellitus) ([place]) 03/06/2015  . HTN  (hypertension) 03/06/2015  . Gastroparesis 02/24/2015  . Pleural effusion 02/19/2015  . HCAP (healthcare-associated pneumonia) 02/19/2015  . End-stage renal disease on hemodialysis ([place]) 02/19/2015     Past Medical History:  Diagnosis Date  . Anemia   . Anxiety   . Arthritis   . Asthma   . chronic diastolic CHF 7/82/9562  . COPD (chronic obstructive pulmonary disease) ([place])   . Coronary artery disease    a. cath 2013: stenting to RCA (report not available); b. cath 2014: LM nl, pLAD 40%, mLAD nl, ost LCx 40%, mid LCx nl, pRCA 30% @ site of prior stent, mRCA 50%  . Depression   . Diabetes mellitus without complication ([place])   . Diabetic neuropathy ([place])   . dialysis 2006  . ESRD (end stage renal disease) on dialysis ([place])    M-W-F  . GERD (gastroesophageal reflux disease)   . Headache   . Hx of pancreatitis 2015  . Hypertension   . Mitral regurgitation    a. echo 10/2013: EF 62%, noWMA, mildly dilated LA, mild to mod MR/TR, GR1DD  . Myocardial infarction ([place])   . Pneumonia   . Renal insufficiency    Pt is on dialysis on M,W + F.     Past Surgical History:  Procedure Laterality Date  . ABDOMINAL HYSTERECTOMY    . APPENDECTOMY    . CARDIAC CATHETERIZATION Left 07/26/2015   Procedure: Left  Heart Cath and Coronary Angiography;  Surgeon: Dionisio David, MD;  Location: [place] CV LAB;  Service: Cardiovascular;  Laterality: Left;  . CHOLECYSTECTOMY    . COLONOSCOPY WITH PROPOFOL N/A 08/12/2016   Procedure: COLONOSCOPY WITH PROPOFOL;  Surgeon: Lollie Sails, MD;  Location[facility] ENDOSCOPY;  Service: Endoscopy;  Laterality: N/A;  . DIALYSIS FISTULA CREATION Left    upper arm  . ESOPHAGOGASTRODUODENOSCOPY N/A 03/08/2015   Procedure: ESOPHAGOGASTRODUODENOSCOPY (EGD);  Surgeon: Manya Silvas, MD;  Location: [facility] ENDOSCOPY;  Service: Endoscopy;  Laterality: N/A;  . ESOPHAGOGASTRODUODENOSCOPY (EGD) WITH PROPOFOL N/A 03/18/2016   Procedure: ESOPHAGOGASTRODUODENOSCOPY (EGD)  WITH PROPOFOL;  Surgeon: [name], MD;  Location: [facility] ENDOSCOPY;  Service: Endoscopy;  Laterality: N/A;  . EYE SURGERY    . FECAL TRANSPLANT N/A 08/23/2015   Procedure: FECAL TRANSPLANT;  Surgeon: Manya Silvas, MD;  Location: [facility] ENDOSCOPY;  Service: Endoscopy;  Laterality: N/A;  . PERIPHERAL VASCULAR CATHETERIZATION N/A 12/20/2015   Procedure: Thrombectomy of dialysis access versus permcath placement;  Surgeon: Algernon Huxley, MD;  Location: [place] [facility];  Service: Cardiovascular;  Laterality: N/A;  . PERIPHERAL VASCULAR CATHETERIZATION N/A 12/20/2015   Procedure: A/V Shunt Intervention;  Surgeon: Algernon Huxley, MD;  Location: [place] [facility];  Service: Cardiovascular;  Laterality: N/A;  . PERIPHERAL VASCULAR CATHETERIZATION N/A 12/20/2015   Procedure: A/V Shuntogram/Fistulagram;  Surgeon: Algernon Huxley, MD;  Location: [place] [facility];  Service: Cardiovascular;  Laterality: N/A;  . PERIPHERAL VASCULAR CATHETERIZATION N/A 01/02/2016   Procedure: A/V Shuntogram/Fistulagram;  Surgeon: Algernon Huxley, MD;  Location: [place] [facility] LAB;  Service: Cardiovascular;  Laterality: N/A;  . PERIPHERAL VASCULAR CATHETERIZATION N/A 01/02/2016   Procedure: A/V Shunt Intervention;  Surgeon: Algernon Huxley, MD;  Location: [place] [facility];  Service: Cardiovascular;  Laterality: N/A;    Social History   Social History  . Marital status: Divorced    Spouse name: N/A  . Number of children: N/A  . Years of education: N/A   Occupational History  . Not on file.   Social History Main Topics  . Smoking status: Former Smoker    Packs/day: 0.50    Types: Cigarettes    Quit date: 02/13/2015  . Smokeless tobacco: Never Used  . Alcohol use 0.6 oz/week    1 Glasses of wine per week     Comment: glass wine week per pt  . Drug use: No  . Sexual activity: No   Other Topics Concern  . Not on file   Social History Narrative  . No narrative on file     Family History  Problem Relation Age of  Onset  . Kidney disease Mother   . Diabetes Mother   . Cancer Father   . Kidney disease Sister      Current Outpatient Prescriptions:  .  albuterol (PROVENTIL HFA;VENTOLIN HFA) 108 (90 Base) MCG/ACT inhaler, Inhale 2 puffs into the lungs every 6 (six) hours as needed for shortness of breath. , Disp: , Rfl:  .  amLODipine (NORVASC) 10 MG tablet, Take 10 mg by mouth daily., Disp: , Rfl:  .  aspirin EC 81 MG tablet, Take 81 mg by mouth daily., Disp: , Rfl:  .  atorvastatin (LIPITOR) 20 MG tablet, , Disp: , Rfl:  .  cetirizine (ZYRTEC) 10 MG tablet, Take 10 mg by mouth daily. , Disp: , Rfl:  .  Cholecalciferol (VITAMIN D-1000 MAX ST) 1000 units tablet, Take by mouth., Disp: ,  Rfl:  .  citalopram (CELEXA) 20 MG tablet, Take by mouth., Disp: , Rfl:  .  clobetasol ointment (TEMOVATE) 0.05 %, Apply topically., Disp: , Rfl:  .  clonazePAM (KLONOPIN) 0.5 MG tablet, Take by mouth., Disp: , Rfl:  .  clopidogrel (PLAVIX) 75 MG tablet, , Disp: , Rfl:  .  diclofenac sodium (VOLTAREN) 1 % GEL, Apply topically., Disp: , Rfl:  .  docusate sodium (COLACE) 100 MG capsule, Take by mouth., Disp: , Rfl:  .  FLUoxetine (PROZAC) 20 MG capsule, Take 1 capsule (20 mg total) by mouth daily. (Patient taking differently: Take 20 mg by mouth at bedtime. ), Disp: 30 capsule, Rfl: 0 .  gabapentin (NEURONTIN) 100 MG capsule, Take 100 mg by mouth., Disp: , Rfl:  .  glipiZIDE (GLUCOTROL XL) 10 MG 24 hr tablet, Take 5 mg by mouth., Disp: , Rfl:  .  glucose 4 GM chewable tablet, Chew 3-4 tablets by mouth as needed for low blood sugar., Disp: , Rfl:  .  hydrALAZINE (APRESOLINE) 25 MG tablet, Take 25 mg by mouth 3 (three) times daily., Disp: , Rfl:  .  HYDROcodone-acetaminophen (NORCO/VICODIN) 5-325 MG tablet, Take 1 tablet by mouth every 6 (six) hours as needed for moderate pain or severe pain., Disp: 15 tablet, Rfl: 0 .  hydrOXYzine (ATARAX/VISTARIL) 25 MG tablet, Take 25 mg by mouth., Disp: , Rfl:  .  insulin aspart  (NOVOLOG) 100 UNIT/ML injection, Inject 0-6 Units into the skin 3 (three) times daily before meals. Per sliding scale, Disp: , Rfl:  .  insulin detemir (LEVEMIR) 100 UNIT/ML injection, Inject 6 Units into the skin at bedtime., Disp: , Rfl:  .  lamoTRIgine (LAMICTAL) 25 MG tablet, Take 25 mg by mouth., Disp: , Rfl:  .  lanthanum (FOSRENOL) 500 MG chewable tablet, Chew 500 mg by mouth., Disp: , Rfl:  .  LEVEMIR FLEXTOUCH 100 UNIT/ML Pen, , Disp: , Rfl:  .  lipase/protease/amylase (CREON) 12000 UNITS CPEP capsule, Take 12,000-36,000 Units by mouth 3 (three) times daily with meals. Take 36000 mg by mouth three times daily with meals and 12000 mg with snacks., Disp: , Rfl:  .  lubiprostone (AMITIZA) 24 MCG capsule, Take by mouth., Disp: , Rfl:  .  meclizine (ANTIVERT) 12.5 MG tablet, Take 1 tablet (12.5 mg total) by mouth 3 (three) times daily as needed for dizziness., Disp: 30 tablet, Rfl: 0 .  metoCLOPramide (REGLAN) 10 MG tablet, Take 10 mg by mouth., Disp: , Rfl:  .  metoprolol (LOPRESSOR) 50 MG tablet, Take 50 mg by mouth 2 (two) times daily., Disp: , Rfl:  .  mirtazapine (REMERON) 15 MG tablet, Take by mouth., Disp: , Rfl:  .  multivitamin (RENA-VIT) TABS tablet, Take 1 tablet by mouth every evening. , Disp: , Rfl:  .  NOVOLOG FLEXPEN 100 UNIT/ML FlexPen, , Disp: , Rfl:  .  OLANZapine (ZYPREXA) 5 MG tablet, Take 0.5 tablets (2.5 mg total) by mouth at bedtime. (Patient taking differently: Take 5 mg by mouth at bedtime. ), Disp: 30 tablet, Rfl: 0 .  omega-3 acid ethyl esters (LOVAZA) 1 g capsule, Take 1 g by mouth 2 (two) times daily. , Disp: , Rfl:  .  Omega-3 Fatty Acids (FISH OIL PO), Take by mouth., Disp: , Rfl:  .  ondansetron (ZOFRAN) 4 MG tablet, Take 4 mg by mouth., Disp: , Rfl:  .  pantoprazole (PROTONIX) 40 MG tablet, Take 1 tablet (40 mg total) by mouth 2 (two) times daily before a  meal., Disp: 60 tablet, Rfl: 2 .  pregabalin (LYRICA) 25 MG capsule, Take 25 mg by mouth daily., Disp: ,  Rfl:  .  Probiotic Product (GNP 4X PROBIOTIC) TABS, Take by mouth., Disp: , Rfl:  .  promethazine (PHENERGAN) 25 MG suppository, Place 1 suppository (25 mg total) rectally every 6 (six) hours as needed for nausea. Stop taking the oral promethazine (phenergan).  Use these suppositories instead., Disp: 12 suppository, Rfl: 0 .  ranitidine (ZANTAC) 150 MG tablet, Take 150 mg by mouth daily. In the afternoon, Disp: , Rfl:  .  ranolazine (RANEXA) 1000 MG SR tablet, Take 1,000 mg by mouth 2 (two) times daily., Disp: , Rfl:  .  rifaximin (XIFAXAN) 550 MG TABS tablet, Take 550 mg by mouth., Disp: , Rfl:  .  sevelamer (RENAGEL) 800 MG tablet, Take by mouth., Disp: , Rfl:  .  sevelamer carbonate (RENVELA) 800 MG tablet, Take 800 mg by mouth 3 (three) times daily with meals. , Disp: , Rfl:  .  triamcinolone ointment (KENALOG) 0.1 %, , Disp: , Rfl:  .  TRUEPLUS PEN NEEDLES 31G X 5 MM MISC, , Disp: , Rfl:  .  vitamin B-12 (CYANOCOBALAMIN) 1000 MCG tablet, VITAMIN B-12 1000 MCG TABS, Disp: , Rfl:  .  nitroGLYCERIN (NITROSTAT) 0.4 MG SL tablet, Place 0.4 mg under the tongue every 5 (five) minutes x 3 doses as needed., Disp: , Rfl:    Physical exam:  Vitals:   01/08/17 1400  BP: (!) 163/76  Pulse: 82  Resp: 18  Temp: 97.1 F (36.2 C)  TempSrc: Tympanic  Weight: 175 lb 8 oz (79.6 kg)  Height: 5' 6.14" (1.68 m)   Physical Exam  Constitutional: She is oriented to person, place, and time and well-developed, well-nourished, and in no distress.  HENT:  Head: Normocephalic and atraumatic.  Eyes: EOM are normal. Pupils are equal, round, and reactive to light.  Neck: Normal range of motion.  Cardiovascular: Normal rate, regular rhythm and normal heart sounds.   Pulmonary/Chest: Effort normal and breath sounds normal.  Abdominal: Soft. Bowel sounds are normal.  Neurological: She is alert and oriented to person, place, and time.  Skin: Skin is warm and dry.       CMP Latest Ref Rng & Units 12/05/2016    Glucose 65 - 99 mg/dL 166(H)  BUN 6 - 20 mg/dL 18  Creatinine 0.44 - 1.00 mg/dL 4.14(H)  Sodium 135 - 145 mmol/L 134(L)  Potassium 3.5 - 5.1 mmol/L 4.0  Chloride 101 - 111 mmol/L 93(L)  CO2 22 - 32 mmol/L 27  Calcium 8.9 - 10.3 mg/dL 9.0  Total Protein 6.5 - 8.1 g/dL 8.4(H)  Total Bilirubin 0.3 - 1.2 mg/dL 0.9  Alkaline Phos 38 - 126 U/L 160(H)  AST 15 - 41 U/L 39  ALT 14 - 54 U/L 22   CBC Latest Ref Rng & Units 12/05/2016  WBC 3.6 - 11.0 K/uL 6.5  Hemoglobin 12.0 - 16.0 g/dL 12.2  Hematocrit 35.0 - 47.0 % 37.1  Platelets 150 - 440 K/uL 207    No images are attached to the encounter.  No results found.  Assessment and plan- Patient is a 56 y.o. female who has been referred to [place] for M spike noted in her urine  Today I will obtain a repeat CBC, CMP, myeloma panel, serum free light chains, 24-hour urine protein and urine protein electrophoresis. I will also obtain baseline skeletal survey. I will see the patient back in 2 weeks'  time to discuss the results of her blood work. Although patient had M spike in her urine, it was a very small amount of monoclonal spike of ~5 mg given that total urine protein was measured at 108 mg.    Thank you for this kind referral and the opportunity to participate in the care of this patient   Visit Diagnosis No diagnosis found.  Dr. Randa Evens, MD, MPH [place] at [facility] Pager- [phone] 01/08/2017

## 2017-01-08 NOTE — ED Provider Notes (Signed)
[facility] Emergency Department Provider Note  ____________________________________________   First MD Initiated Contact with Patient 01/08/17 2011     (approximate)  I have reviewed the triage vital signs and the nursing notes.   HISTORY  Chief Complaint Abdominal Pain   HPI [name] is a 56 y.o. female with a history of chronic abdominal pain as well as end-stage renal disease on dialysis who is presenting to the emergency department today with right upper as well as epigastric abdominal pain. She says the pain started today during the afternoon and has been constant, dull and as needed a half out of 10 at this time. It does not radiate to the chest or the back. The patient says that she has had associated nausea and vomiting throughout the afternoon with multiple episodes of vomiting. Denies any diarrhea. Says that the last time she was in the emergency department and morphine as well as Phenergan helped with her symptoms. Last dialyzed yesterday and plans to be dialyzed again tomorrow.   Past Medical History:  Diagnosis Date  . Anemia   . Anxiety   . Arthritis   . Asthma   . chronic diastolic CHF 05/02/9146  . COPD (chronic obstructive pulmonary disease) ([place])   . Coronary artery disease    a. cath 2013: stenting to RCA (report not available); b. cath 2014: LM nl, pLAD 40%, mLAD nl, ost LCx 40%, mid LCx nl, pRCA 30% @ site of prior stent, mRCA 50%  . Depression   . Diabetes mellitus without complication ([place])   . Diabetic neuropathy ([place])   . dialysis 2006  . ESRD (end stage renal disease) on dialysis ([place])    M-W-F  . GERD (gastroesophageal reflux disease)   . Headache   . Hx of pancreatitis 2015  . Hypertension   . Mitral regurgitation    a. echo 10/2013: EF 62%, noWMA, mildly dilated LA, mild to mod MR/TR, GR1DD  . Myocardial infarction ([place])   . Pneumonia   . Renal insufficiency    Pt is on dialysis on M,W + F.    Patient Active  Problem List   Diagnosis Date Noted  . Cellulitis of lower extremity 07/29/2016  . Chronic venous insufficiency 07/29/2016  . Lymphedema 07/29/2016  . TIA (transient ischemic attack) 04/21/2016  . Altered mental status 04/08/2016  . Hyperammonemia ([place]) 04/08/2016  . Elevated troponin 04/08/2016  . ESRD (end stage renal disease) ([place]) 04/08/2016  . Depression 04/08/2016  . Depression, major, recurrent, severe with psychosis ([place]) 04/08/2016  . Blood in stool   . Intractable cyclical vomiting with nausea   . Reflux esophagitis   . Gastritis   . Generalized abdominal pain   . Uncontrollable vomiting   . Major depressive disorder, recurrent episode, moderate ([place]) 03/15/2016  . Adjustment disorder with mixed anxiety and depressed mood 03/15/2016  . Malnutrition of moderate degree 12/01/2015  . Renal mass   . Dyspnea   . Acute renal failure ([place])   . Respiratory failure ([place])   . High temperature 11/14/2015  . Pulmonary edema   . Encounter for central line placement   . Encounter for orogastric (OG) tube placement   . Nausea 11/12/2015  . Hyperkalemia 10/03/2015  . Diarrhea, unspecified 07/22/2015  . Pneumonia 05/21/2015  . Hypoglycemia 04/24/2015  . Unresponsiveness 04/24/2015  . Bradycardia 04/24/2015  . Hypothermia 04/24/2015  . Acute respiratory failure ([place]) 04/24/2015  . Acute diastolic CHF (congestive heart failure) ([place]) 04/05/2015  . Diabetic  gastroparesis ([place]) 04/05/2015  . Hypokalemia 04/05/2015  . Generalized weakness 04/05/2015  . Acute pulmonary edema ([place]) 04/03/2015  . Nausea and vomiting 04/03/2015  . Hypoglycemia associated with diabetes ([place]) 04/03/2015  . Anemia of chronic disease 04/03/2015  . Secondary hyperparathyroidism ([place]) 04/03/2015  . Pressure ulcer 04/02/2015  . Acute respiratory failure with hypoxia ([place]) 04/01/2015  . Adjustment disorder with anxiety 03/14/2015  . Somatic symptom disorder, mild 03/08/2015  . Coronary artery disease  involving native coronary artery of native heart without angina pectoris   . Nausea & vomiting 03/06/2015  . Abdominal pain 03/06/2015  . DM (diabetes mellitus) ([place]) 03/06/2015  . HTN (hypertension) 03/06/2015  . Gastroparesis 02/24/2015  . Pleural effusion 02/19/2015  . HCAP (healthcare-associated pneumonia) 02/19/2015  . End-stage renal disease on hemodialysis ([place]) 02/19/2015    Past Surgical History:  Procedure Laterality Date  . ABDOMINAL HYSTERECTOMY    . APPENDECTOMY    . CARDIAC CATHETERIZATION Left 07/26/2015   Procedure: Left Heart Cath and Coronary Angiography;  Surgeon: Dionisio , MD;  Location: [place] [facility];  Service: Cardiovascular;  Laterality: Left;  . CHOLECYSTECTOMY    . COLONOSCOPY WITH PROPOFOL N/A 08/12/2016   Procedure: COLONOSCOPY WITH PROPOFOL;  Surgeon: Lollie Sails, MD;  Location: [facility] ENDOSCOPY;  Service: Endoscopy;  Laterality: N/A;  . DIALYSIS FISTULA CREATION Left    upper arm  . ESOPHAGOGASTRODUODENOSCOPY N/A 03/08/2015   Procedure: ESOPHAGOGASTRODUODENOSCOPY (EGD);  Surgeon: Manya Silvas, MD;  Location: [facility] ENDOSCOPY;  Service: Endoscopy;  Laterality: N/A;  . ESOPHAGOGASTRODUODENOSCOPY (EGD) WITH PROPOFOL N/A 03/18/2016   Procedure: ESOPHAGOGASTRODUODENOSCOPY (EGD) WITH PROPOFOL;  Surgeon: [name], MD;  Location: [facility] ENDOSCOPY;  Service: Endoscopy;  Laterality: N/A;  . EYE SURGERY    . FECAL TRANSPLANT N/A 08/23/2015   Procedure: FECAL TRANSPLANT;  Surgeon: Manya Silvas, MD;  Location: [facility] ENDOSCOPY;  Service: Endoscopy;  Laterality: N/A;  . PERIPHERAL VASCULAR CATHETERIZATION N/A 12/20/2015   Procedure: Thrombectomy of dialysis access versus permcath placement;  Surgeon: Algernon Huxley, MD;  Location: [place] [facility] LAB;  Service: Cardiovascular;  Laterality: N/A;  . PERIPHERAL VASCULAR CATHETERIZATION N/A 12/20/2015   Procedure: A/V Shunt Intervention;  Surgeon: Algernon Huxley, MD;  Location: [place] [facility] LAB;  Service:  Cardiovascular;  Laterality: N/A;  . PERIPHERAL VASCULAR CATHETERIZATION N/A 12/20/2015   Procedure: A/V Shuntogram/Fistulagram;  Surgeon: Algernon Huxley, MD;  Location: [place] [facility] LAB;  Service: Cardiovascular;  Laterality: N/A;  . PERIPHERAL VASCULAR CATHETERIZATION N/A 01/02/2016   Procedure: A/V Shuntogram/Fistulagram;  Surgeon: Algernon Huxley, MD;  Location: [place] [facility] LAB;  Service: Cardiovascular;  Laterality: N/A;  . PERIPHERAL VASCULAR CATHETERIZATION N/A 01/02/2016   Procedure: A/V Shunt Intervention;  Surgeon: Algernon Huxley, MD;  Location: [place] [facility] LAB;  Service: Cardiovascular;  Laterality: N/A;    Prior to Admission medications   Medication Sig Start Date End Date Taking? Authorizing Provider  albuterol (PROVENTIL HFA;VENTOLIN HFA) 108 (90 Base) MCG/ACT inhaler Inhale 2 puffs into the lungs every 6 (six) hours as needed for shortness of breath.     Historical Provider, MD  amLODipine (NORVASC) 10 MG tablet Take 10 mg by mouth daily.    Historical Provider, MD  aspirin EC 81 MG tablet Take 81 mg by mouth daily. 09/08/16   Historical Provider, MD  atorvastatin (LIPITOR) 20 MG tablet  12/02/16   Historical Provider, MD  cetirizine (ZYRTEC) 10 MG tablet Take 10 mg by mouth daily.  Historical Provider, MD  Cholecalciferol (VITAMIN D-1000 MAX ST) 1000 units tablet Take by mouth. 03/30/10   Historical Provider, MD  citalopram (CELEXA) 20 MG tablet Take by mouth. 03/30/10   Historical Provider, MD  clobetasol ointment (TEMOVATE) 0.05 % Apply topically. 10/07/16 10/07/17  Historical Provider, MD  clonazePAM (KLONOPIN) 0.5 MG tablet Take by mouth. 03/30/10   Historical Provider, MD  clopidogrel (PLAVIX) 75 MG tablet  10/14/16   Historical Provider, MD  diclofenac sodium (VOLTAREN) 1 % GEL Apply topically.    Historical Provider, MD  docusate sodium (COLACE) 100 MG capsule Take by mouth. 03/30/10   Historical Provider, MD  FLUoxetine (PROZAC) 20 MG capsule Take 1 capsule (20 mg total) by  mouth daily. Patient taking differently: Take 20 mg by mouth at bedtime.  04/10/16   Lytle Butte, MD  gabapentin (NEURONTIN) 100 MG capsule Take 100 mg by mouth. 11/26/06   Historical Provider, MD  glipiZIDE (GLUCOTROL XL) 10 MG 24 hr tablet Take 5 mg by mouth. 03/30/10   Historical Provider, MD  glucose 4 GM chewable tablet Chew 3-4 tablets by mouth as needed for low blood sugar.    Historical Provider, MD  hydrALAZINE (APRESOLINE) 25 MG tablet Take 25 mg by mouth 3 (three) times daily.    Historical Provider, MD  HYDROcodone-acetaminophen (NORCO/VICODIN) 5-325 MG tablet Take 1 tablet by mouth every 6 (six) hours as needed for moderate pain or severe pain. 04/29/16   Loletha Grayer, MD  hydrOXYzine (ATARAX/VISTARIL) 25 MG tablet Take 25 mg by mouth.    Historical Provider, MD  insulin aspart (NOVOLOG) 100 UNIT/ML injection Inject 0-6 Units into the skin 3 (three) times daily before meals. Per sliding scale    Historical Provider, MD  insulin detemir (LEVEMIR) 100 UNIT/ML injection Inject 6 Units into the skin at bedtime.    Historical Provider, MD  lamoTRIgine (LAMICTAL) 25 MG tablet Take 25 mg by mouth.    Historical Provider, MD  lanthanum (FOSRENOL) 500 MG chewable tablet Chew 500 mg by mouth. 04/02/07   Historical Provider, MD  LEVEMIR FLEXTOUCH 100 UNIT/ML Pen  12/03/16   Historical Provider, MD  lipase/protease/amylase (CREON) 12000 UNITS CPEP capsule Take 12,000-36,000 Units by mouth 3 (three) times daily with meals. Take 36000 mg by mouth three times daily with meals and 12000 mg with snacks.    Historical Provider, MD  lubiprostone (AMITIZA) 24 MCG capsule Take by mouth. 07/13/16   Historical Provider, MD  meclizine (ANTIVERT) 12.5 MG tablet Take 1 tablet (12.5 mg total) by mouth 3 (three) times daily as needed for dizziness. 04/29/16   Loletha Grayer, MD  metoCLOPramide (REGLAN) 10 MG tablet Take 10 mg by mouth. 03/30/10   Historical Provider, MD  metoprolol (LOPRESSOR) 50 MG tablet Take 50 mg by  mouth 2 (two) times daily.    Historical Provider, MD  mirtazapine (REMERON) 15 MG tablet Take by mouth. 05/09/10   Historical Provider, MD  multivitamin (RENA-VIT) TABS tablet Take 1 tablet by mouth every evening.     Historical Provider, MD  nitroGLYCERIN (NITROSTAT) 0.4 MG SL tablet Place 0.4 mg under the tongue every 5 (five) minutes x 3 doses as needed.    Historical Provider, MD  NOVOLOG FLEXPEN 100 UNIT/ML FlexPen  12/03/16   Historical Provider, MD  OLANZapine (ZYPREXA) 5 MG tablet Take 0.5 tablets (2.5 mg total) by mouth at bedtime. Patient taking differently: Take 5 mg by mouth at bedtime.  04/29/16   Loletha Grayer, MD  omega-3 acid ethyl  esters (LOVAZA) 1 g capsule Take 1 g by mouth 2 (two) times daily.     Historical Provider, MD  Omega-3 Fatty Acids (FISH OIL PO) Take by mouth.    Historical Provider, MD  ondansetron (ZOFRAN) 4 MG tablet Take 4 mg by mouth. 12/06/07   Historical Provider, MD  pantoprazole (PROTONIX) 40 MG tablet Take 1 tablet (40 mg total) by mouth 2 (two) times daily before a meal. 03/19/16   Gladstone Lighter, MD  pregabalin (LYRICA) 25 MG capsule Take 25 mg by mouth daily.    Historical Provider, MD  Probiotic Product (GNP 4X PROBIOTIC) TABS Take by mouth.    Historical Provider, MD  promethazine (PHENERGAN) 25 MG suppository Place 1 suppository (25 mg total) rectally every 6 (six) hours as needed for nausea. Stop taking the oral promethazine (phenergan).  Use these suppositories instead. 07/13/16 07/13/17  Orbie Pyo, MD  ranitidine (ZANTAC) 150 MG tablet Take 150 mg by mouth daily. In the afternoon    Historical Provider, MD  ranolazine (RANEXA) 1000 MG SR tablet Take 1,000 mg by mouth 2 (two) times daily.    Historical Provider, MD  rifaximin (XIFAXAN) 550 MG TABS tablet Take 550 mg by mouth.    Historical Provider, MD  sevelamer (RENAGEL) 800 MG tablet Take by mouth. 03/30/10   Historical Provider, MD  sevelamer carbonate (RENVELA) 800 MG tablet Take 800  mg by mouth 3 (three) times daily with meals.     Historical Provider, MD  triamcinolone ointment (KENALOG) 0.1 %  09/29/16   Historical Provider, MD  TRUEPLUS PEN NEEDLES 31G X 5 MM MISC  11/27/16   Historical Provider, MD  vitamin B-12 (CYANOCOBALAMIN) 1000 MCG tablet VITAMIN B-12 1000 MCG TABS 06/20/15   Historical Provider, MD    Allergies Ace inhibitors; Compazine [prochlorperazine edisylate]; Sumatriptan succinate; Ativan [lorazepam]; Dilaudid [hydromorphone hcl]; Gabapentin; Losartan; Ondansetron; Prochlorperazine; Reglan [metoclopramide]; Scopolamine; Zofran [ondansetron hcl]; Codeine; Lac bovis; Oxycodone; Tape; and Tapentadol  Family History  Problem Relation Age of Onset  . Kidney disease Mother   . Diabetes Mother   . Cancer Father   . Kidney disease Sister     Social History Social History  Substance Use Topics  . Smoking status: Former Smoker    Packs/day: 0.50    Types: Cigarettes    Quit date: 02/13/2015  . Smokeless tobacco: Never Used  . Alcohol use 0.6 oz/week    1 Glasses of wine per week     Comment: glass wine week per pt    Review of Systems Constitutional: No fever/chills Eyes: No visual changes. ENT: No sore throat. Cardiovascular: Denies chest pain. Respiratory: Denies shortness of breath. Gastrointestinal:  No diarrhea.  No constipation. Genitourinary: Negative for dysuria. Musculoskeletal: Negative for back pain. Skin: Negative for rash. Neurological: Negative for headaches, focal weakness or numbness.  10-point ROS otherwise negative.  ____________________________________________   PHYSICAL EXAM:  VITAL SIGNS: ED Triage Vitals  Enc Vitals Group     BP 01/08/17 2018 (!) 154/59     Pulse Rate 01/08/17 2018 72     Resp 01/08/17 2018 20     Temp --      Temp src --      SpO2 01/08/17 2018 99 %     Weight --      Height --      Head Circumference --      Peak Flow --      Pain Score 01/08/17 1657 10     Pain  Loc --      Pain Edu? --        Excl. in [place]? --     Constitutional: Alert and oriented. in no acute distress. Eyes: Conjunctivae are normal. PERRL. EOMI. Head: Atraumatic. Nose: No congestion/rhinnorhea. Mouth/Throat: Mucous membranes are moist.   Neck: No stridor.   Cardiovascular: Normal rate, regular rhythm. Grossly normal heart sounds.  Palpable thrill to the left upper extremity fistula.  Respiratory: Normal respiratory effort.  No retractions. Lungs CTAB. Gastrointestinal: Soft and with moderate tenderness to the right upper quadrant as well as the epigastrium without any rigidity, rebound or guarding. No distention. No CVA tenderness. Musculoskeletal: No lower extremity tenderness nor edema.  No joint effusions. Neurologic:  Normal speech and language. No gross focal neurologic deficits are appreciated.  Skin:  Skin is warm, dry and intact. No rash noted. Psychiatric: Mood and affect are normal. Speech and behavior are normal.  ____________________________________________   LABS (all labs ordered are listed, but only abnormal results are displayed)  Labs Reviewed  COMPREHENSIVE METABOLIC PANEL - Abnormal; Notable for the following:       Result Value   Chloride 94 (*)    Glucose, Bld 300 (*)    BUN 43 (*)    Creatinine, Ser 5.22 (*)    Total Protein 8.6 (*)    Alkaline Phosphatase 142 (*)    GFR calc non Af Amer 8 (*)    GFR calc Af Amer 10 (*)    All other components within normal limits  CBC - Abnormal; Notable for the following:    RDW 16.2 (*)    All other components within normal limits  LIPASE, BLOOD  TROPONIN I  URINALYSIS, COMPLETE (UACMP) WITH MICROSCOPIC   ____________________________________________  EKG  ED ECG REPORT I, Doran Stabler, the attending physician, personally viewed and interpreted this ECG.   Date: 01/08/2017  EKG Time: 2143  Rate: 71  Rhythm: normal sinus rhythm  Axis: Normal  Intervals:none  ST&T Change: No ST segment elevation or depression. No  abnormal T-wave inversion.  ____________________________________________  [id number]  DG Abdomen 1 View (Final result)  Result time 01/08/17 21:30:56  Final result by Lucienne Capers, MD (01/08/17 21:30:56)           Narrative:   CLINICAL DATA: Sudden onset right upper quadrant abdominal pain since this afternoon. Nausea and vomiting.  EXAM: ABDOMEN - 1 VIEW  COMPARISON: 12/05/2016  FINDINGS: Gas and stool demonstrated throughout the colon. No small or large bowel distention. No radiopaque stones. Prominent vascular calcifications. Surgical clips in the right upper quadrant. Visualized bones are grossly intact.  IMPRESSION: Nonobstructive bowel gas pattern with stool-filled colon.   Electronically Signed By: Lucienne Capers M.D. On: 01/08/2017 21:30          ____________________________________________   PROCEDURES  Procedure(s) performed:   Procedures  Critical Care performed:   ____________________________________________   INITIAL IMPRESSION / ASSESSMENT AND PLAN / ED COURSE  Pertinent labs & imaging results that were available during my care of the patient were reviewed by me and considered in my medical decision making (see chart for details).  ----------------------------------------- 10:14 PM on 01/08/2017 -----------------------------------------  Patient resting comfortable at this time without further episodes of vomiting. Has Phenergan at home for antinausea and vomiting as an outpatient. I explained the lab results as well as the plan for outpatient follow-up to the patient as well as the family. The patient will be discharged home. Likely recurrent abdominal pain. Very reassuring  lab work as well as x-ray and EKG. She is understanding of the plan and willing to comply.      ____________________________________________   FINAL CLINICAL IMPRESSION(S) / ED DIAGNOSES  Abdominal pain. Nausea and vomiting.    NEW MEDICATIONS  STARTED DURING THIS VISIT:  New Prescriptions   No medications on file     Note:  This document was prepared using Dragon voice recognition software and may include unintentional dictation errors.    Orbie Pyo, MD 01/08/17 2216

## 2017-01-29 ENCOUNTER — Inpatient Hospital Stay: Payer: Medicare Other

## 2017-01-29 ENCOUNTER — Inpatient Hospital Stay: Payer: Medicare Other | Attending: Oncology | Admitting: Oncology

## 2017-01-29 ENCOUNTER — Encounter: Payer: Self-pay | Admitting: Oncology

## 2017-01-29 ENCOUNTER — Ambulatory Visit
Admission: RE | Admit: 2017-01-29 | Discharge: 2017-01-29 | Disposition: A | Discharge status: Home / Self Care | Payer: Medicare Other | Source: Ambulatory Visit | Attending: Urology | Admitting: Urology

## 2017-01-29 VITALS — BP 163/74 | HR 77 | Temp 98.2°F | Resp 20 | Wt 185.2 lb

## 2017-01-29 DIAGNOSIS — M8580 Other specified disorders of bone density and structure, unspecified site: Secondary | ICD-10-CM

## 2017-01-29 DIAGNOSIS — K219 Gastro-esophageal reflux disease without esophagitis: Secondary | ICD-10-CM | POA: Insufficient documentation

## 2017-01-29 DIAGNOSIS — Z7982 Long term (current) use of aspirin: Secondary | ICD-10-CM | POA: Diagnosis not present

## 2017-01-29 DIAGNOSIS — E1122 Type 2 diabetes mellitus with diabetic chronic kidney disease: Secondary | ICD-10-CM | POA: Diagnosis not present

## 2017-01-29 DIAGNOSIS — Z992 Dependence on renal dialysis: Secondary | ICD-10-CM | POA: Diagnosis not present

## 2017-01-29 DIAGNOSIS — M1990 Unspecified osteoarthritis, unspecified site: Secondary | ICD-10-CM | POA: Diagnosis not present

## 2017-01-29 DIAGNOSIS — Z794 Long term (current) use of insulin: Secondary | ICD-10-CM | POA: Diagnosis not present

## 2017-01-29 DIAGNOSIS — F418 Other specified anxiety disorders: Secondary | ICD-10-CM | POA: Diagnosis not present

## 2017-01-29 DIAGNOSIS — M545 Low back pain: Secondary | ICD-10-CM | POA: Insufficient documentation

## 2017-01-29 DIAGNOSIS — N2581 Secondary hyperparathyroidism of renal origin: Secondary | ICD-10-CM | POA: Insufficient documentation

## 2017-01-29 DIAGNOSIS — N186 End stage renal disease: Secondary | ICD-10-CM | POA: Diagnosis not present

## 2017-01-29 DIAGNOSIS — R5381 Other malaise: Secondary | ICD-10-CM | POA: Insufficient documentation

## 2017-01-29 DIAGNOSIS — E1143 Type 2 diabetes mellitus with diabetic autonomic (poly)neuropathy: Secondary | ICD-10-CM | POA: Insufficient documentation

## 2017-01-29 DIAGNOSIS — I872 Venous insufficiency (chronic) (peripheral): Secondary | ICD-10-CM | POA: Diagnosis not present

## 2017-01-29 DIAGNOSIS — N2889 Other specified disorders of kidney and ureter: Secondary | ICD-10-CM | POA: Insufficient documentation

## 2017-01-29 DIAGNOSIS — E785 Hyperlipidemia, unspecified: Secondary | ICD-10-CM | POA: Insufficient documentation

## 2017-01-29 DIAGNOSIS — R5383 Other fatigue: Secondary | ICD-10-CM | POA: Diagnosis not present

## 2017-01-29 DIAGNOSIS — R778 Other specified abnormalities of plasma proteins: Secondary | ICD-10-CM | POA: Diagnosis not present

## 2017-01-29 DIAGNOSIS — J90 Pleural effusion, not elsewhere classified: Secondary | ICD-10-CM | POA: Insufficient documentation

## 2017-01-29 DIAGNOSIS — R590 Localized enlarged lymph nodes: Secondary | ICD-10-CM | POA: Insufficient documentation

## 2017-01-29 DIAGNOSIS — I700 Atherosclerosis of aorta: Secondary | ICD-10-CM | POA: Diagnosis not present

## 2017-01-29 DIAGNOSIS — J440 Chronic obstructive pulmonary disease with acute lower respiratory infection: Secondary | ICD-10-CM | POA: Insufficient documentation

## 2017-01-29 DIAGNOSIS — R531 Weakness: Secondary | ICD-10-CM | POA: Diagnosis not present

## 2017-01-29 DIAGNOSIS — I5032 Chronic diastolic (congestive) heart failure: Secondary | ICD-10-CM | POA: Insufficient documentation

## 2017-01-29 DIAGNOSIS — Z79899 Other long term (current) drug therapy: Secondary | ICD-10-CM | POA: Insufficient documentation

## 2017-01-29 DIAGNOSIS — I132 Hypertensive heart and chronic kidney disease with heart failure and with stage 5 chronic kidney disease, or end stage renal disease: Secondary | ICD-10-CM | POA: Diagnosis not present

## 2017-01-29 DIAGNOSIS — I2510 Atherosclerotic heart disease of native coronary artery without angina pectoris: Secondary | ICD-10-CM | POA: Diagnosis not present

## 2017-01-29 DIAGNOSIS — D638 Anemia in other chronic diseases classified elsewhere: Secondary | ICD-10-CM | POA: Diagnosis not present

## 2017-01-29 DIAGNOSIS — R109 Unspecified abdominal pain: Secondary | ICD-10-CM | POA: Diagnosis not present

## 2017-01-29 MED ORDER — IOPAMIDOL (ISOVUE-300) INJECTION 61%
100.0000 mL | Freq: Once | INTRAVENOUS | Status: AC | PRN
  Administered 2017-01-29: 100 mL via INTRAVENOUS

## 2017-01-29 NOTE — Progress Notes (Signed)
Hematology/Oncology Consult note [facility] [phone] [phone]  Patient Care Team: Ellamae Sia, MD as PCP - General (Internal Medicine)   Name of the patient: [name]  [id number]  11-05-1960    Reason for referral- abnormal spep   Referring physician- Dr. Annalee Genta  Date of visit: 01/29/17   History of presenting illness- patient is a 56 year old female with a past medical history significant for coronary artery disease, type 2 diabetes with neuropathy, hypertension hyperlipidemia and osteopenia arthritis. She has ESRD secondary to HTN/DM and has been on dialysis for about 14 years. She was seen by rheumatology for complaints of low back pain. She also had painful subcutaneous skin rashes and was seen by dermatology and biopsy of one of the lesions showed mild septal panicky like this. As a part of workup by rheumatology doctor Dr. Lezlie Lye- Meda Coffee she had SPEP done which came back abnormal with the presence of M spike and hence has been referred to [place]. There was still some ongoing concern about possible lupus and she follows up with rheumatology. Her last colonoscopy was in November 2017 which showed tubular adenoma and a repeat colonoscopy was recommended in 5 years.  Her recent bloodwork was as follows: CBC from 01/08/2017 showed white count of 6.7, H&H of 13.9/42.8 and a platelet count of 161. CMP showed elevated BUN of 43 and creatinine of 5.22. Calcium was normal at 9.7. Total protein was mildly elevated at 8.6. An albumin was normal at 3.9. SPEP from 11/06/2016 did not reveal any M spike. M spike of 4.2% was noted on 24-hour urine protein.   ECOG PS- 2  Pain scale- 9 abdominal pain   Review of systems- Review of Systems  Constitutional: Positive for malaise/fatigue. Negative for chills, fever and weight loss.  HENT: Negative for congestion, ear discharge and nosebleeds.   Eyes: Negative for blurred vision.  Respiratory:  Negative for cough, hemoptysis, sputum production, shortness of breath and wheezing.   Cardiovascular: Negative for chest pain, palpitations, orthopnea and claudication.  Gastrointestinal: Positive for abdominal pain. Negative for blood in stool, constipation, diarrhea, heartburn, melena, nausea and vomiting.  Genitourinary: Negative for dysuria, flank pain, frequency, hematuria and urgency.  Musculoskeletal: Negative for back pain, joint pain and myalgias.  Skin: Negative for rash.  Neurological: Positive for weakness. Negative for dizziness, tingling, focal weakness, seizures and headaches.  Endo/Heme/Allergies: Does not bruise/bleed easily.  Psychiatric/Behavioral: Negative for depression and suicidal ideas. The patient does not have insomnia.     Allergies  Allergen Reactions  . Ace Inhibitors Swelling and Anaphylaxis  . Compazine [Prochlorperazine Edisylate] Anaphylaxis and Nausea And Vomiting    Other reaction(s): dystonia from this vs. reglan 23 Jul - patient relates that she takes promethazine frequently with no problems. 23 Jul - patient relates that she takes promethazine frequently with no problems.  . Sumatriptan Succinate     Other reaction(s): delirium and hallucinations per [facility] records  . Ativan [Lorazepam] Other (See Comments)    Reaction:Hallucinations and headaches Reaction:Hallucinations and headaches Reaction:  Hallucinations and headaches  . Dilaudid [Hydromorphone Hcl] Other (See Comments)    Delirium Delirium  . Gabapentin Other (See Comments)    Reaction:  Unknown  Reaction:  Unknown   . Losartan Other (See Comments)    Reaction:  Unknown   . Ondansetron Other (See Comments)    Reaction:  Unknown  Reaction:  Unknown   . Prochlorperazine Other (See Comments)    Reaction:  Unknown  Reaction:  Unknown   . Reglan [Metoclopramide]     Per patient her Dr. Evelina Bucy her off it  Per patient her Dr. Evelina Bucy her off it   . Scopolamine Other (See Comments)     Reaction:  Unknown   . Zofran [Ondansetron Hcl] Other (See Comments)    Reaction:  hallucinations  Reaction:  hallucinations   . Codeine Nausea And Vomiting  . [place] Nausea And Vomiting  . Oxycodone Anxiety  . Tape Rash  . Tapentadol Rash    Patient Active Problem List   Diagnosis Date Noted  . Cellulitis of lower extremity 07/29/2016  . Chronic venous insufficiency 07/29/2016  . Lymphedema 07/29/2016  . TIA (transient ischemic attack) 04/21/2016  . Altered mental status 04/08/2016  . Hyperammonemia ([place]) 04/08/2016  . Elevated troponin 04/08/2016  . ESRD (end stage renal disease) ([place]) 04/08/2016  . Depression 04/08/2016  . Depression, major, recurrent, severe with psychosis ([place]) 04/08/2016  . Blood in stool   . Intractable cyclical vomiting with nausea   . Reflux esophagitis   . Gastritis   . Generalized abdominal pain   . Uncontrollable vomiting   . Major depressive disorder, recurrent episode, moderate ([place]) 03/15/2016  . Adjustment disorder with mixed anxiety and depressed mood 03/15/2016  . Malnutrition of moderate degree 12/01/2015  . Renal mass   . Dyspnea   . Acute renal failure ([place])   . Respiratory failure ([place])   . High temperature 11/14/2015  . Pulmonary edema   . Encounter for central line placement   . Encounter for orogastric (OG) tube placement   . Nausea 11/12/2015  . Hyperkalemia 10/03/2015  . Diarrhea, unspecified 07/22/2015  . Pneumonia 05/21/2015  . Hypoglycemia 04/24/2015  . Unresponsiveness 04/24/2015  . Bradycardia 04/24/2015  . Hypothermia 04/24/2015  . Acute respiratory failure ([place]) 04/24/2015  . Acute diastolic CHF (congestive heart failure) ([place]) 04/05/2015  . Diabetic gastroparesis ([place]) 04/05/2015  . Hypokalemia 04/05/2015  . Generalized weakness 04/05/2015  . Acute pulmonary edema ([place]) 04/03/2015  . Nausea and vomiting 04/03/2015  . Hypoglycemia associated with diabetes ([place]) 04/03/2015  . Anemia of chronic disease  04/03/2015  . Secondary hyperparathyroidism ([place]) 04/03/2015  . Pressure ulcer 04/02/2015  . Acute respiratory failure with hypoxia ([place]) 04/01/2015  . Adjustment disorder with anxiety 03/14/2015  . Somatic symptom disorder, mild 03/08/2015  . Coronary artery disease involving native coronary artery of native heart without angina pectoris   . Nausea & vomiting 03/06/2015  . Abdominal pain 03/06/2015  . DM (diabetes mellitus) ([place]) 03/06/2015  . HTN (hypertension) 03/06/2015  . Gastroparesis 02/24/2015  . Pleural effusion 02/19/2015  . HCAP (healthcare-associated pneumonia) 02/19/2015  . End-stage renal disease on hemodialysis ([place]) 02/19/2015     Past Medical History:  Diagnosis Date  . Anemia   . Anxiety   . Arthritis   . Asthma   . chronic diastolic CHF 4/33/2951  . COPD (chronic obstructive pulmonary disease) ([place])   . Coronary artery disease    a. cath 2013: stenting to RCA (report not available); b. cath 2014: LM nl, pLAD 40%, mLAD nl, ost LCx 40%, mid LCx nl, pRCA 30% @ site of prior stent, mRCA 50%  . Depression   . Diabetes mellitus without complication ([place])   . Diabetic neuropathy ([place])   . dialysis 2006  . ESRD (end stage renal disease) on dialysis ([place])    M-W-F  . GERD (gastroesophageal reflux disease)   . Headache   . Hx of pancreatitis 2015  .  Hypertension   . Mitral regurgitation    a. echo 10/2013: EF 62%, noWMA, mildly dilated LA, mild to mod MR/TR, GR1DD  . Myocardial infarction ([place])   . Pneumonia   . Renal insufficiency    Pt is on dialysis on M,W + F.     Past Surgical History:  Procedure Laterality Date  . ABDOMINAL HYSTERECTOMY    . APPENDECTOMY    . CARDIAC CATHETERIZATION Left 07/26/2015   Procedure: Left Heart Cath and Coronary Angiography;  Surgeon: Dionisio David, MD;  Location: [place] [facility];  Service: Cardiovascular;  Laterality: Left;  . CHOLECYSTECTOMY    . COLONOSCOPY WITH PROPOFOL N/A 08/12/2016   Procedure: COLONOSCOPY  WITH PROPOFOL;  Surgeon: Lollie Sails, MD;  Location: [facility] ENDOSCOPY;  Service: Endoscopy;  Laterality: N/A;  . DIALYSIS FISTULA CREATION Left    upper arm  . ESOPHAGOGASTRODUODENOSCOPY N/A 03/08/2015   Procedure: ESOPHAGOGASTRODUODENOSCOPY (EGD);  Surgeon: Manya Silvas, MD;  Location: [facility] ENDOSCOPY;  Service: Endoscopy;  Laterality: N/A;  . ESOPHAGOGASTRODUODENOSCOPY (EGD) WITH PROPOFOL N/A 03/18/2016   Procedure: ESOPHAGOGASTRODUODENOSCOPY (EGD) WITH PROPOFOL;  Surgeon: [name], MD;  Location: [facility] ENDOSCOPY;  Service: Endoscopy;  Laterality: N/A;  . EYE SURGERY    . FECAL TRANSPLANT N/A 08/23/2015   Procedure: FECAL TRANSPLANT;  Surgeon: Manya Silvas, MD;  Location[facility] ENDOSCOPY;  Service: Endoscopy;  Laterality: N/A;  . PERIPHERAL VASCULAR CATHETERIZATION N/A 12/20/2015   Procedure: Thrombectomy of dialysis access versus permcath placement;  Surgeon: Algernon Huxley, MD;  Location: [place] [facility];  Service: Cardiovascular;  Laterality: N/A;  . PERIPHERAL VASCULAR CATHETERIZATION N/A 12/20/2015   Procedure: A/V Shunt Intervention;  Surgeon: Algernon Huxley, MD;  Location: [place] [facility];  Service: Cardiovascular;  Laterality: N/A;  . PERIPHERAL VASCULAR CATHETERIZATION N/A 12/20/2015   Procedure: A/V Shuntogram/Fistulagram;  Surgeon: Algernon Huxley, MD;  Location: [place] [facility];  Service: Cardiovascular;  Laterality: N/A;  . PERIPHERAL VASCULAR CATHETERIZATION N/A 01/02/2016   Procedure: A/V Shuntogram/Fistulagram;  Surgeon: Algernon Huxley, MD;  Location: [place] [facility];  Service: Cardiovascular;  Laterality: N/A;  . PERIPHERAL VASCULAR CATHETERIZATION N/A 01/02/2016   Procedure: A/V Shunt Intervention;  Surgeon: Algernon Huxley, MD;  Location: [place] [facility];  Service: Cardiovascular;  Laterality: N/A;    Social History   Social History  . Marital status: Divorced    Spouse name: N/A  . Number of children: N/A  . Years of education: N/A   Occupational  History  . Not on file.   Social History Main Topics  . Smoking status: Former Smoker    Packs/day: 0.50    Types: Cigarettes    Quit date: 02/13/2015  . Smokeless tobacco: Never Used  . Alcohol use 0.6 oz/week    1 Glasses of wine per week     Comment: glass wine week per pt  . Drug use: No  . Sexual activity: No   Other Topics Concern  . Not on file   Social History Narrative  . No narrative on file     Family History  Problem Relation Age of Onset  . Kidney disease Mother   . Diabetes Mother   . Cancer Father   . Kidney disease Sister      Current Outpatient Prescriptions:  .  albuterol (PROVENTIL HFA;VENTOLIN HFA) 108 (90 Base) MCG/ACT inhaler, Inhale 2 puffs into the lungs every 6 (six) hours as needed for shortness of breath. , Disp: , Rfl:  .  amLODipine (NORVASC) 10 MG tablet, Take 10 mg by mouth daily., Disp: , Rfl:  .  aspirin EC 81 MG tablet, Take 81 mg by mouth daily., Disp: , Rfl:  .  atorvastatin (LIPITOR) 20 MG tablet, , Disp: , Rfl:  .  cetirizine (ZYRTEC) 10 MG tablet, Take 10 mg by mouth daily. , Disp: , Rfl:  .  Cholecalciferol (VITAMIN D-1000 MAX ST) 1000 units tablet, Take by mouth., Disp: , Rfl:  .  citalopram (CELEXA) 20 MG tablet, Take by mouth., Disp: , Rfl:  .  clobetasol ointment (TEMOVATE) 0.05 %, Apply topically., Disp: , Rfl:  .  clonazePAM (KLONOPIN) 0.5 MG tablet, Take by mouth., Disp: , Rfl:  .  clopidogrel (PLAVIX) 75 MG tablet, , Disp: , Rfl:  .  diclofenac sodium (VOLTAREN) 1 % GEL, Apply topically., Disp: , Rfl:  .  docusate sodium (COLACE) 100 MG capsule, Take by mouth., Disp: , Rfl:  .  FLUoxetine (PROZAC) 20 MG capsule, Take 1 capsule (20 mg total) by mouth daily. (Patient taking differently: Take 20 mg by mouth at bedtime. ), Disp: 30 capsule, Rfl: 0 .  gabapentin (NEURONTIN) 100 MG capsule, Take 100 mg by mouth., Disp: , Rfl:  .  glipiZIDE (GLUCOTROL XL) 10 MG 24 hr tablet, Take 5 mg by mouth., Disp: , Rfl:  .  glucose 4 GM  chewable tablet, Chew 3-4 tablets by mouth as needed for low blood sugar., Disp: , Rfl:  .  hydrALAZINE (APRESOLINE) 25 MG tablet, Take 25 mg by mouth 3 (three) times daily., Disp: , Rfl:  .  HYDROcodone-acetaminophen (NORCO/VICODIN) 5-325 MG tablet, Take 1 tablet by mouth every 6 (six) hours as needed for moderate pain or severe pain., Disp: 15 tablet, Rfl: 0 .  hydrOXYzine (ATARAX/VISTARIL) 25 MG tablet, Take 25 mg by mouth., Disp: , Rfl:  .  insulin aspart (NOVOLOG) 100 UNIT/ML injection, Inject 0-6 Units into the skin 3 (three) times daily before meals. Per sliding scale, Disp: , Rfl:  .  insulin detemir (LEVEMIR) 100 UNIT/ML injection, Inject 6 Units into the skin at bedtime., Disp: , Rfl:  .  lamoTRIgine (LAMICTAL) 25 MG tablet, Take 25 mg by mouth., Disp: , Rfl:  .  lanthanum (FOSRENOL) 500 MG chewable tablet, Chew 500 mg by mouth., Disp: , Rfl:  .  LEVEMIR FLEXTOUCH 100 UNIT/ML Pen, , Disp: , Rfl:  .  lipase/protease/amylase (CREON) 12000 UNITS CPEP capsule, Take 12,000-36,000 Units by mouth 3 (three) times daily with meals. Take 36000 mg by mouth three times daily with meals and 12000 mg with snacks., Disp: , Rfl:  .  lubiprostone (AMITIZA) 24 MCG capsule, Take by mouth., Disp: , Rfl:  .  meclizine (ANTIVERT) 12.5 MG tablet, Take 1 tablet (12.5 mg total) by mouth 3 (three) times daily as needed for dizziness., Disp: 30 tablet, Rfl: 0 .  metoCLOPramide (REGLAN) 10 MG tablet, Take 10 mg by mouth., Disp: , Rfl:  .  metoprolol (LOPRESSOR) 50 MG tablet, Take 50 mg by mouth 2 (two) times daily., Disp: , Rfl:  .  mirtazapine (REMERON) 15 MG tablet, Take by mouth., Disp: , Rfl:  .  multivitamin (RENA-VIT) TABS tablet, Take 1 tablet by mouth every evening. , Disp: , Rfl:  .  nitroGLYCERIN (NITROSTAT) 0.4 MG SL tablet, Place 0.4 mg under the tongue every 5 (five) minutes x 3 doses as needed., Disp: , Rfl:  .  NOVOLOG FLEXPEN 100 UNIT/ML FlexPen, , Disp: , Rfl:  .  OLANZapine (ZYPREXA) 5  MG tablet,  Take 0.5 tablets (2.5 mg total) by mouth at bedtime. (Patient taking differently: Take 5 mg by mouth at bedtime. ), Disp: 30 tablet, Rfl: 0 .  omega-3 acid ethyl esters (LOVAZA) 1 g capsule, Take 1 g by mouth 2 (two) times daily. , Disp: , Rfl:  .  Omega-3 Fatty Acids (FISH OIL PO), Take by mouth., Disp: , Rfl:  .  ondansetron (ZOFRAN) 4 MG tablet, Take 4 mg by mouth., Disp: , Rfl:  .  pantoprazole (PROTONIX) 40 MG tablet, Take 1 tablet (40 mg total) by mouth 2 (two) times daily before a meal., Disp: 60 tablet, Rfl: 2 .  pregabalin (LYRICA) 25 MG capsule, Take 25 mg by mouth daily., Disp: , Rfl:  .  Probiotic Product (GNP 4X PROBIOTIC) TABS, Take by mouth., Disp: , Rfl:  .  promethazine (PHENERGAN) 25 MG suppository, Place 1 suppository (25 mg total) rectally every 6 (six) hours as needed for nausea. Stop taking the oral promethazine (phenergan).  Use these suppositories instead., Disp: 12 suppository, Rfl: 0 .  ranitidine (ZANTAC) 150 MG tablet, Take 150 mg by mouth daily. In the afternoon, Disp: , Rfl:  .  ranolazine (RANEXA) 1000 MG SR tablet, Take 1,000 mg by mouth 2 (two) times daily., Disp: , Rfl:  .  rifaximin (XIFAXAN) 550 MG TABS tablet, Take 550 mg by mouth., Disp: , Rfl:  .  sevelamer (RENAGEL) 800 MG tablet, Take by mouth., Disp: , Rfl:  .  sevelamer carbonate (RENVELA) 800 MG tablet, Take 800 mg by mouth 3 (three) times daily with meals. , Disp: , Rfl:  .  triamcinolone ointment (KENALOG) 0.1 %, , Disp: , Rfl:  .  TRUEPLUS PEN NEEDLES 31G X 5 MM MISC, , Disp: , Rfl:  .  vitamin B-12 (CYANOCOBALAMIN) 1000 MCG tablet, VITAMIN B-12 1000 MCG TABS, Disp: , Rfl:    Physical exam:  Vitals:   01/29/17 1549  BP: (!) 163/74  Pulse: 77  Resp: 20  Temp: 98.2 F (36.8 C)  TempSrc: Tympanic  Weight: 185 lb 3.2 oz (84 kg)   Physical Exam  Constitutional: She is oriented to person, place, and time.  Pt appears older than stated age. She is sitting in a wheelchair  HENT:  Head:  Normocephalic and atraumatic.  Eyes: EOM are normal. Pupils are equal, round, and reactive to light.  Neck: Normal range of motion.  Cardiovascular: Normal rate and regular rhythm.   Murmur (systolic murmur+) heard. Pulmonary/Chest: Effort normal and breath sounds normal.  Abdominal: Soft. Bowel sounds are normal.  Neurological: She is alert and oriented to person, place, and time.  Skin: Skin is warm and dry.       CMP Latest Ref Rng & Units 01/08/2017  Glucose 65 - 99 mg/dL 300(H)  BUN 6 - 20 mg/dL 43(H)  Creatinine 0.44 - 1.00 mg/dL 5.22(H)  Sodium 135 - 145 mmol/L 135  Potassium 3.5 - 5.1 mmol/L 4.5  Chloride 101 - 111 mmol/L 94(L)  CO2 22 - 32 mmol/L 28  Calcium 8.9 - 10.3 mg/dL 9.7  Total Protein 6.5 - 8.1 g/dL 8.6(H)  Total Bilirubin 0.3 - 1.2 mg/dL 0.8  Alkaline Phos 38 - 126 U/L 142(H)  AST 15 - 41 U/L 27  ALT 14 - 54 U/L 14   CBC Latest Ref Rng & Units 01/08/2017  WBC 3.6 - 11.0 K/uL 6.7  Hemoglobin 12.0 - 16.0 g/dL 13.9  Hematocrit 35.0 - 47.0 % 42.8  Platelets 150 - 440  K/uL 161    No images are attached to the encounter.  Dg Abdomen 1 View  Result Date: 01/08/2017 CLINICAL DATA:  Sudden onset right upper quadrant abdominal pain since this afternoon. Nausea and vomiting. EXAM: ABDOMEN - 1 VIEW COMPARISON:  12/05/2016 FINDINGS: Gas and stool demonstrated throughout the colon. No small or large bowel distention. No radiopaque stones. Prominent vascular calcifications. Surgical clips in the right upper quadrant. Visualized bones are grossly intact. IMPRESSION: Nonobstructive bowel gas pattern with stool-filled colon. Electronically Signed   By: Lucienne Capers M.D.   On: 01/08/2017 21:30    Assessment and plan- Patient is a 56 y.o. female referred to [place] for M spike in urine  Patient had small amount of M spike in urine but not in serum based on previous labs. I will start off with cbc, cmp, myeloma panel, kapap lambda free light chains, 24 hour urine pep and ife,  retic count, iron studies b12 and folate today. I will see her back in 2-3 weeks and discuss further management at that time   Thank you for this kind referral and the opportunity to participate in the care of this patient   Visit Diagnosis 1. Abnormal SPEP     Dr. Randa Evens, MD, MPH [facility] Pager- [phone] 01/29/2017

## 2017-02-03 ENCOUNTER — Ambulatory Visit: Payer: Self-pay | Admitting: Urology

## 2017-02-04 ENCOUNTER — Ambulatory Visit: Payer: Self-pay | Admitting: Urology

## 2017-02-05 ENCOUNTER — Ambulatory Visit (INDEPENDENT_AMBULATORY_CARE_PROVIDER_SITE_OTHER): Payer: Medicare Other | Admitting: Urology

## 2017-02-05 ENCOUNTER — Encounter: Payer: Self-pay | Admitting: Urology

## 2017-02-05 VITALS — BP 147/58 | HR 71 | Ht 64.0 in | Wt 184.0 lb

## 2017-02-05 DIAGNOSIS — N2889 Other specified disorders of kidney and ureter: Secondary | ICD-10-CM | POA: Diagnosis not present

## 2017-02-05 LAB — COMPREHENSIVE METABOLIC PANEL: Potassium: UNDETERMINED mmol/L (ref 3.5–5.1)

## 2017-02-05 NOTE — Progress Notes (Signed)
02/05/2017 9:47 AM   [name] 30-May-1961 [id number]  Referring provider: Ellamae Sia, MD [address] [place], [place] [address]  Chief Complaint  Patient presents with  . Follow-up    45month with results    HPI: 56 y.o. year old F with chronic renal disease on hemodialysis, CAD, DM with neuropathy, COPD, chronic diastolic heart failure followed for incidental small right renal masses x 3.  Review of previous CT scan from 02/22/2015 to show presence of these tumors although with a more subtle appearance but relatively unchanged in size.  CT abd with and without contrast performed on 08/01/16 shows an enhancing lateral upper pole right renal lesion measuring 11 mm, 1.3 as well as a 6 mm medial upper pole lesion which are unchanged in size since previous imaging.    Most recent scan on 01/29/2017 shows unchanged renal masses. Her lymphadenopathy is stable.  She denies any flank pain or gross hematuria.  Due to being on hemodialysis, she voids only a small amount every other day.  No UTIs.  PMH: Past Medical History:  Diagnosis Date  . Anemia   . Anxiety   . Arthritis   . Asthma   . chronic diastolic CHF 9/51/8841  . COPD (chronic obstructive pulmonary disease) ([place])   . Coronary artery disease    a. cath 2013: stenting to RCA (report not available); b. cath 2014: LM nl, pLAD 40%, mLAD nl, ost LCx 40%, mid LCx nl, pRCA 30% @ site of prior stent, mRCA 50%  . Depression   . Diabetes mellitus without complication ([place])   . Diabetic neuropathy ([place])   . dialysis 2006  . ESRD (end stage renal disease) on dialysis ([place])    M-W-F  . GERD (gastroesophageal reflux disease)   . Headache   . Hx of pancreatitis 2015  . Hypertension   . Mitral regurgitation    a. echo 10/2013: EF 62%, noWMA, mildly dilated LA, mild to mod MR/TR, GR1DD  . Myocardial infarction ([place])   . Pneumonia   . Renal insufficiency    Pt is on dialysis on M,W + F.    Surgical  History: Past Surgical History:  Procedure Laterality Date  . ABDOMINAL HYSTERECTOMY    . APPENDECTOMY    . CARDIAC CATHETERIZATION Left 07/26/2015   Procedure: Left Heart Cath and Coronary Angiography;  Surgeon: Dionisio David, MD;  Location: [facility];  Service: Cardiovascular;  Laterality: Left;  . CHOLECYSTECTOMY    . COLONOSCOPY WITH PROPOFOL N/A 08/12/2016   Procedure: COLONOSCOPY WITH PROPOFOL;  Surgeon: Lollie Sails, MD;  Location: [facility] ENDOSCOPY;  Service: Endoscopy;  Laterality: N/A;  . DIALYSIS FISTULA CREATION Left    upper arm  . ESOPHAGOGASTRODUODENOSCOPY N/A 03/08/2015   Procedure: ESOPHAGOGASTRODUODENOSCOPY (EGD);  Surgeon: Manya Silvas, MD;  Location: [facility] ENDOSCOPY;  Service: Endoscopy;  Laterality: N/A;  . ESOPHAGOGASTRODUODENOSCOPY (EGD) WITH PROPOFOL N/A 03/18/2016   Procedure: ESOPHAGOGASTRODUODENOSCOPY (EGD) WITH PROPOFOL;  Surgeon: [name], MD;  Location: [facility] ENDOSCOPY;  Service: Endoscopy;  Laterality: N/A;  . EYE SURGERY    . FECAL TRANSPLANT N/A 08/23/2015   Procedure: FECAL TRANSPLANT;  Surgeon: Manya Silvas, MD;  Location[facility]COPY;  Service: Endoscopy;  Laterality: N/A;  . PERIPHERAL VASCULAR CATHETERIZATION N/A 12/20/2015   Procedure: Thrombectomy of dialysis access versus permcath placement;  Surgeon: Algernon Huxley, MD;  Location: [place];  Service: Cardiovascular;  Laterality: N/A;  . PERIPHERAL VASCULAR CATHETERIZATION N/A 12/20/2015  Procedure: A/V Shunt Intervention;  Surgeon: Algernon Huxley, MD;  Location: [facility];  Service: Cardiovascular;  Laterality: N/A;  . PERIPHERAL VASCULAR CATHETERIZATION N/A 12/20/2015   Procedure: A/V Shuntogram/Fistulagram;  Surgeon: Algernon Huxley, MD;  Location: [facility];  Service: Cardiovascular;  Laterality: N/A;  . PERIPHERAL VASCULAR CATHETERIZATION N/A 01/02/2016   Procedure: A/V Shuntogram/Fistulagram;  Surgeon: Algernon Huxley, MD;  Location: [facility];   Service: Cardiovascular;  Laterality: N/A;  . PERIPHERAL VASCULAR CATHETERIZATION N/A 01/02/2016   Procedure: A/V Shunt Intervention;  Surgeon: Algernon Huxley, MD;  Location: [facility];  Service: Cardiovascular;  Laterality: N/A;    Home Medications:  Allergies as of 02/05/2017      Reactions   Ace Inhibitors Swelling, Anaphylaxis   Compazine [prochlorperazine Edisylate] Anaphylaxis, Nausea And Vomiting   Other reaction(s): dystonia from this vs. reglan 23 Jul - patient relates that she takes promethazine frequently with no problems. 23 Jul - patient relates that she takes promethazine frequently with no problems.   Sumatriptan Succinate    Other reaction(s): delirium and hallucinations per [facility] records   Ativan [lorazepam] Other (See Comments)   Reaction:Hallucinations and headaches Reaction:Hallucinations and headaches Reaction:  Hallucinations and headaches   Dilaudid [hydromorphone Hcl] Other (See Comments)   Delirium Delirium   Gabapentin Other (See Comments)   Reaction:  Unknown  Reaction:  Unknown    Losartan Other (See Comments)   Reaction:  Unknown    Ondansetron Other (See Comments)   Reaction:  Unknown  Reaction:  Unknown    Prochlorperazine Other (See Comments)   Reaction:  Unknown  Reaction:  Unknown    Reglan [metoclopramide]    Per patient her Dr. Evelina Bucy her off it  Per patient her Dr. Evelina Bucy her off it    Scopolamine Other (See Comments)   Reaction:  Unknown    Zofran [ondansetron Hcl] Other (See Comments)   Reaction:  hallucinations  Reaction:  hallucinations    Codeine Nausea And Vomiting   Lac Bovis Nausea And Vomiting   Oxycodone Anxiety   Tape Rash   Tapentadol Rash      Medication List       Accurate as of 02/05/17  9:47 AM. Always use your most recent med list.          albuterol 108 (90 Base) MCG/ACT inhaler Commonly known as:  PROVENTIL HFA;VENTOLIN HFA Inhale 2 puffs into the lungs every 6 (six) hours as needed for shortness of  breath.   amLODipine 10 MG tablet Commonly known as:  NORVASC Take 10 mg by mouth daily.   aspirin EC 81 MG tablet Take 81 mg by mouth daily.   atorvastatin 20 MG tablet Commonly known as:  LIPITOR   cetirizine 10 MG tablet Commonly known as:  ZYRTEC Take 10 mg by mouth daily.   citalopram 20 MG tablet Commonly known as:  CELEXA Take by mouth.   clobetasol ointment 0.05 % Commonly known as:  TEMOVATE Apply topically.   clonazePAM 0.5 MG tablet Commonly known as:  KLONOPIN Take by mouth.   clopidogrel 75 MG tablet Commonly known as:  PLAVIX   diclofenac sodium 1 % Gel Commonly known as:  VOLTAREN Apply topically.   docusate sodium 100 MG capsule Commonly known as:  COLACE Take by mouth.   FISH OIL PO Take by mouth.   FLUoxetine 20 MG capsule Commonly known as:  PROZAC Take 1 capsule (20 mg total) by mouth daily.  gabapentin 100 MG capsule Commonly known as:  NEURONTIN Take 100 mg by mouth.   glipiZIDE 10 MG 24 hr tablet Commonly known as:  GLUCOTROL XL Take 5 mg by mouth.   glucose 4 GM chewable tablet Chew 3-4 tablets by mouth as needed for low blood sugar.   GNP 4X PROBIOTIC Tabs Take by mouth.   hydrALAZINE 25 MG tablet Commonly known as:  APRESOLINE Take 25 mg by mouth 3 (three) times daily.   HYDROcodone-acetaminophen 5-325 MG tablet Commonly known as:  NORCO/VICODIN Take 1 tablet by mouth every 6 (six) hours as needed for moderate pain or severe pain.   hydrOXYzine 25 MG tablet Commonly known as:  ATARAX/VISTARIL Take 25 mg by mouth.   insulin aspart 100 UNIT/ML injection Commonly known as:  novoLOG Inject 0-6 Units into the skin 3 (three) times daily before meals. Per sliding scale   NOVOLOG FLEXPEN 100 UNIT/ML FlexPen Generic drug:  insulin aspart   insulin detemir 100 UNIT/ML injection Commonly known as:  LEVEMIR Inject 6 Units into the skin at bedtime.   LEVEMIR FLEXTOUCH 100 UNIT/ML Pen Generic drug:  Insulin Detemir    lamoTRIgine 25 MG tablet Commonly known as:  LAMICTAL Take 25 mg by mouth.   lanthanum 500 MG chewable tablet Commonly known as:  FOSRENOL Chew 500 mg by mouth.   lipase/protease/amylase 12000 units Cpep capsule Commonly known as:  CREON Take 12,000-36,000 Units by mouth 3 (three) times daily with meals. Take 36000 mg by mouth three times daily with meals and 12000 mg with snacks.   lubiprostone 24 MCG capsule Commonly known as:  AMITIZA Take by mouth.   meclizine 12.5 MG tablet Commonly known as:  ANTIVERT Take 1 tablet (12.5 mg total) by mouth 3 (three) times daily as needed for dizziness.   metoCLOPramide 10 MG tablet Commonly known as:  REGLAN Take 10 mg by mouth.   metoprolol tartrate 50 MG tablet Commonly known as:  LOPRESSOR Take 50 mg by mouth 2 (two) times daily.   multivitamin Tabs tablet Take 1 tablet by mouth every evening.   nitroGLYCERIN 0.4 MG SL tablet Commonly known as:  NITROSTAT Place 0.4 mg under the tongue every 5 (five) minutes x 3 doses as needed.   OLANZapine 5 MG tablet Commonly known as:  ZYPREXA Take 0.5 tablets (2.5 mg total) by mouth at bedtime.   omega-3 acid ethyl esters 1 g capsule Commonly known as:  LOVAZA Take 1 g by mouth 2 (two) times daily.   ondansetron 4 MG tablet Commonly known as:  ZOFRAN Take 4 mg by mouth.   pantoprazole 40 MG tablet Commonly known as:  PROTONIX Take 1 tablet (40 mg total) by mouth 2 (two) times daily before a meal.   pregabalin 25 MG capsule Commonly known as:  LYRICA Take 25 mg by mouth daily.   ranitidine 150 MG tablet Commonly known as:  ZANTAC Take 150 mg by mouth daily. In the afternoon   sevelamer carbonate 800 MG tablet Commonly known as:  RENVELA Take 800 mg by mouth 3 (three) times daily with meals.   VITAMIN D-1000 MAX ST 1000 units tablet Generic drug:  Cholecalciferol Take by mouth.       Allergies:  Allergies  Allergen Reactions  . Ace Inhibitors Swelling and  Anaphylaxis  . Compazine [Prochlorperazine Edisylate] Anaphylaxis and Nausea And Vomiting    Other reaction(s): dystonia from this vs. reglan 23 Jul - patient relates that she takes promethazine frequently with no problems. [place]  Jul - patient relates that she takes promethazine frequently with no problems.  . Sumatriptan Succinate     Other reaction(s): delirium and hallucinations per [facility] records  . Ativan [Lorazepam] Other (See Comments)    Reaction:Hallucinations and headaches Reaction:Hallucinations and headaches Reaction:  Hallucinations and headaches  . Dilaudid [Hydromorphone Hcl] Other (See Comments)    Delirium Delirium  . Gabapentin Other (See Comments)    Reaction:  Unknown  Reaction:  Unknown   . Losartan Other (See Comments)    Reaction:  Unknown   . Ondansetron Other (See Comments)    Reaction:  Unknown  Reaction:  Unknown   . Prochlorperazine Other (See Comments)    Reaction:  Unknown  Reaction:  Unknown   . Reglan [Metoclopramide]     Per patient her Dr. Evelina Bucy her off it  Per patient her Dr. Evelina Bucy her off it   . Scopolamine Other (See Comments)    Reaction:  Unknown   . Zofran [Ondansetron Hcl] Other (See Comments)    Reaction:  hallucinations  Reaction:  hallucinations   . Codeine Nausea And Vomiting  . Lac Bovis Nausea And Vomiting  . Oxycodone Anxiety  . Tape Rash  . Tapentadol Rash    Family History: Family History  Problem Relation Age of Onset  . Kidney disease Mother   . Diabetes Mother   . Cancer Father   . Kidney disease Sister     Social History:  reports that she quit smoking about 1 years ago. Her smoking use included Cigarettes. She smoked 0.50 packs per day. She has never used smokeless tobacco. She reports that she drinks about 0.6 oz of alcohol per week . She reports that she does not use drugs.  ROS: UROLOGY Frequent Urination?: No Hard to postpone urination?: Yes Burning/pain with urination?: No Get up at night to urinate?:  No Leakage of urine?: No Urine stream starts and stops?: Yes Trouble starting stream?: Yes Do you have to strain to urinate?: No Blood in urine?: No Urinary tract infection?: No Sexually transmitted disease?: No Injury to kidneys or bladder?: No Painful intercourse?: No Weak stream?: No Currently pregnant?: No Vaginal bleeding?: No Last menstrual period?: n  Gastrointestinal Nausea?: Yes Vomiting?: Yes Indigestion/heartburn?: Yes Diarrhea?: Yes Constipation?: Yes  Constitutional Fever: No Night sweats?: No Weight loss?: No Fatigue?: No  Skin Skin rash/lesions?: No Itching?: No  Eyes Blurred vision?: No Double vision?: No  Ears/Nose/Throat Sore throat?: No Sinus problems?: No  Hematologic/Lymphatic Swollen glands?: No Easy bruising?: Yes  Cardiovascular Leg swelling?: Yes Chest pain?: No  Respiratory Cough?: Yes Shortness of breath?: Yes  Endocrine Excessive thirst?: No  Musculoskeletal Back pain?: Yes Joint pain?: Yes  Neurological Headaches?: Yes Dizziness?: Yes  Psychologic Depression?: Yes Anxiety?: Yes  Physical Exam: BP (!) 147/58   Pulse 71   Ht 5\' 4"  (1.626 m)   Wt 184 lb (83.5 kg)   BMI 31.58 kg/m   Constitutional:  Alert and oriented, No acute distress.  Thin.   HEENT: [place] AT, moist mucus membranes.  Trachea midline, no masses. Cardiovascular: No clubbing, cyanosis, or edema. Respiratory: Normal respiratory effort, no increased work of breathing. GI: Abdomen is soft, nontender, nondistended, no abdominal masses Skin: No rashes, bruises or suspicious lesions. Neurologic: Grossly intact, no focal deficits, moving all 4 extremities. Psychiatric: Normal mood and affect.  Laboratory Data: Lab Results  Component Value Date   WBC 6.7 01/08/2017   HGB 13.9 01/08/2017   HCT 42.8 01/08/2017   MCV 95.4  01/08/2017   PLT 161 01/08/2017    Lab Results  Component Value Date   CREATININE PENDING 01/29/2017    Lab Results   Component Value Date   HGBA1C 5.1 04/08/2016   Pertinent Imaging: CLINICAL DATA:  Followup kidney mass.  EXAM: CT ABDOMEN AND PELVIS WITHOUT AND WITH CONTRAST  TECHNIQUE: Multidetector CT imaging of the abdomen and pelvis was performed following the standard protocol before and following the bolus administration of intravenous contrast.  CONTRAST:  134mL ISOVUE-300 IOPAMIDOL (ISOVUE-300) INJECTION 61%  COMPARISON:  12/03/2016  FINDINGS: Lower chest: Small right pleural effusion. Diffuse right-sided pleural thickening is identified and unchanged from previous exam.  Hepatobiliary: No focal liver abnormality. Mild increase caliber of the intrahepatic and common bile ducts. The common bile duct measures up to 9 mm, image 42 of series 14.  Pancreas: Unremarkable. No pancreatic ductal dilatation or surrounding inflammatory changes.  Spleen: Normal in size without focal abnormality.  Adrenals/Urinary Tract: The adrenal glands appear normal. Mild bilateral renal atrophy identified. Small arterial phase solid enhancing lesions involving the upper pole of the right kidney are again noted. The largest is anteriorly measuring 1.2 cm, image 48 of series 2. Posteriorly enhancing lesion is stable measuring 11 mm, image 46 of series 6. And the smallest lesion which is anteriorly measures 8 mm, image 46 of series 6. Previously this measured the same. No hydronephrosis identified. Thick walled urinary bladder is again noted and appears partially collapsed.  Stomach/Bowel: Stomach is within normal limits. Appendix appears normal. No evidence of bowel wall thickening, distention, or inflammatory changes.  Vascular/Lymphatic: Aortic atherosclerosis. No upper abdominal adenopathy. Prominent bilateral inguinal lymph nodes do left inguinal node measures 1.4 cm, image 157 of series 11. This is similar to 04/21/16. Right inguinal node measures 1.5 cm, image 151 of series 11.  Previously 1.3 cm.  Reproductive: Previous hysterectomy.  No adnexal mass.  Other: No free fluid or fluid collections.  Musculoskeletal: No aggressive lytic or sclerotic bone lesions identified.  IMPRESSION: 1. Stable appearance of solid appearing enhancing lesions within the upper pole of the right kidney which are most consistent with renal cell carcinomas. No evidence for metastatic disease. 2. Persistent right pleural effusion. Chronic right-sided pleural thickening with enhancement is unchanged from previous exam. 3. Aortic atherosclerosis. 4. Bilateral enlarged inguinal lymph nodes are identified. A similar finding was noted on study from 04/21/2016.   Electronically Signed   By: Kerby Moors M.D.   On: 01/29/2017 15:49  CT scan was personally reviewed today and with the patient. These images were personally compared to previous scans on 3/18, 11/17, 10/16.  Assessment & Plan:    1. Right renal mass x 3 Multiple right upper pole lesions measuring up to 13 mm, essentially stable since 2016.  Imaging reviewed as above.    Options for ongoing management of these lesions were discussed today in detail. Given that she is on hemodialysis, nephrectomy would be an option although she has multiple medical comorbidities and does not think that she would tolerate surgery.  Ablation appears difficult given the multifocal lesions.  Given her overall clinical picture with multiple medical comorbidities on end-stage renal disease on hemodialysis, I continue to recommend continued observation given the stability of the lesions. We discussed that these lesions tend to grow only millimeters a year and lesions or 3 cm or less, risk of metastatic disease is less than 5%. She is agreeable with this plan.  Recommend follow-up repeat cross-sectional imaging in 12 months.  She  understands the importance of continued follow-up.  - CT ABDOMEN PELVIS W WO CONTRAST; Future   Return in  about 1 year (around 02/05/2018) for CT abd/ pelvis.  Hollice Espy, MD  [facility] [address], [place], [place] [address] [phone]

## 2017-02-17 ENCOUNTER — Inpatient Hospital Stay: Payer: Medicare Other | Admitting: Oncology

## 2017-02-17 ENCOUNTER — Telehealth: Payer: Self-pay | Admitting: *Deleted

## 2017-02-17 NOTE — Telephone Encounter (Signed)
Called pt and left message that I know she did not need to come because the lab was not able to get her blood when she came to see [place]. We would like to try getting in touch with a provider that she goes to that does get her blood and we can request them to get the blood work. I will await her phone call

## 2017-02-19 ENCOUNTER — Encounter (INDEPENDENT_AMBULATORY_CARE_PROVIDER_SITE_OTHER): Payer: Medicare Other

## 2017-02-19 ENCOUNTER — Encounter: Payer: Self-pay | Admitting: Emergency Medicine

## 2017-02-19 ENCOUNTER — Ambulatory Visit (INDEPENDENT_AMBULATORY_CARE_PROVIDER_SITE_OTHER): Payer: Self-pay | Admitting: Vascular Surgery

## 2017-02-19 ENCOUNTER — Emergency Department
Admission: EM | Admit: 2017-02-19 | Discharge: 2017-02-19 | Disposition: A | Discharge status: Home / Self Care | Payer: Medicare Other | Attending: Emergency Medicine | Admitting: Emergency Medicine

## 2017-02-19 ENCOUNTER — Emergency Department: Payer: Medicare Other

## 2017-02-19 DIAGNOSIS — Y999 Unspecified external cause status: Secondary | ICD-10-CM | POA: Insufficient documentation

## 2017-02-19 DIAGNOSIS — Z794 Long term (current) use of insulin: Secondary | ICD-10-CM | POA: Diagnosis not present

## 2017-02-19 DIAGNOSIS — W19XXXA Unspecified fall, initial encounter: Secondary | ICD-10-CM | POA: Diagnosis not present

## 2017-02-19 DIAGNOSIS — I132 Hypertensive heart and chronic kidney disease with heart failure and with stage 5 chronic kidney disease, or end stage renal disease: Secondary | ICD-10-CM | POA: Insufficient documentation

## 2017-02-19 DIAGNOSIS — N186 End stage renal disease: Secondary | ICD-10-CM | POA: Diagnosis not present

## 2017-02-19 DIAGNOSIS — Z87891 Personal history of nicotine dependence: Secondary | ICD-10-CM | POA: Insufficient documentation

## 2017-02-19 DIAGNOSIS — Z7982 Long term (current) use of aspirin: Secondary | ICD-10-CM | POA: Diagnosis not present

## 2017-02-19 DIAGNOSIS — S52502A Unspecified fracture of the lower end of left radius, initial encounter for closed fracture: Secondary | ICD-10-CM

## 2017-02-19 DIAGNOSIS — Z79899 Other long term (current) drug therapy: Secondary | ICD-10-CM | POA: Insufficient documentation

## 2017-02-19 DIAGNOSIS — I5031 Acute diastolic (congestive) heart failure: Secondary | ICD-10-CM | POA: Diagnosis not present

## 2017-02-19 DIAGNOSIS — Y929 Unspecified place or not applicable: Secondary | ICD-10-CM | POA: Diagnosis not present

## 2017-02-19 DIAGNOSIS — S6992XA Unspecified injury of left wrist, hand and finger(s), initial encounter: Secondary | ICD-10-CM | POA: Diagnosis present

## 2017-02-19 DIAGNOSIS — Y939 Activity, unspecified: Secondary | ICD-10-CM | POA: Diagnosis not present

## 2017-02-19 LAB — BASIC METABOLIC PANEL
Anion gap: 14 (ref 5–15)
BUN: 20 mg/dL (ref 6–20)
CO2: 31 mmol/L (ref 22–32)
Calcium: 8.6 mg/dL — ABNORMAL LOW (ref 8.9–10.3)
Chloride: 88 mmol/L — ABNORMAL LOW (ref 101–111)
Creatinine, Ser: 4.56 mg/dL — ABNORMAL HIGH (ref 0.44–1.00)
GFR calc Af Amer: 11 mL/min — ABNORMAL LOW (ref >60)
GFR calc non Af Amer: 10 mL/min — ABNORMAL LOW (ref >60)
Glucose, Bld: 344 mg/dL — ABNORMAL HIGH (ref 65–99)
Potassium: 4.9 mmol/L (ref 3.5–5.1)
Sodium: 133 mmol/L — ABNORMAL LOW (ref 135–145)

## 2017-02-19 LAB — CBC
HCT: 37.6 % (ref 35.0–47.0)
Hemoglobin: 12.3 g/dL (ref 12.0–16.0)
MCH: 31.1 pg (ref 26.0–34.0)
MCHC: 32.8 g/dL (ref 32.0–36.0)
MCV: 94.8 fL (ref 80.0–100.0)
Platelets: 165 10*3/uL (ref 150–440)
RBC: 3.97 MIL/uL (ref 3.80–5.20)
RDW: 17.7 % — ABNORMAL HIGH (ref 11.5–14.5)
WBC: 6 10*3/uL (ref 3.6–11.0)

## 2017-02-19 LAB — GLUCOSE, CAPILLARY: Glucose-Capillary: 276 mg/dL — ABNORMAL HIGH (ref 65–99)

## 2017-02-19 MED ORDER — HYDROCODONE-ACETAMINOPHEN 5-325 MG PO TABS
1.0000 | ORAL_TABLET | Freq: Once | ORAL | Status: AC
  Administered 2017-02-19: 1 via ORAL
  Filled 2017-02-19: qty 1

## 2017-02-19 NOTE — ED Notes (Signed)
Pt taken to lobby in w/c. Requesting to sit outside with her blanket. Pt taken outside

## 2017-02-19 NOTE — ED Notes (Signed)
Patient in radiology at this time. Will assess when patient is in the room.

## 2017-02-19 NOTE — ED Triage Notes (Signed)
Says last night her sugar was 30--went to kitchen to get something to drink to bring it up and woke up on floor.  Today her left arm is hurting (this is also where her dialysis is done)  Says she has had some v76omiting and nausea for several day.  fsbs 276 now.  Will place left arm insling for comfort.

## 2017-02-19 NOTE — ED Provider Notes (Signed)
[facility] Emergency Department Provider Note   ____________________________________________    I have reviewed the triage vital signs and the nursing notes.   HISTORY  Chief Complaint Loss of Consciousness; Wrist Pain; and Hypoglycemia     HPI [name] is a 56 y.o. female with a history of diabetes and end-stage renal disease who presents with wrist pain. Patient reports her sugar was low last night and she went to drink some orange juicebut became dizzy and fell onto her left wrist. She was able to drink some soda and felt better but complains of moderate-to-severe pain in her left wrist especially when she moves it. No other injuries reported. No head injury. No chest pain or shortness of breath. Compliant with dialysis.   Past Medical History:  Diagnosis Date  . Anemia   . Anxiety   . Arthritis   . Asthma   . chronic diastolic CHF 2/68/3419  . COPD (chronic obstructive pulmonary disease) ([place])   . Coronary artery disease    a. cath 2013: stenting to RCA (report not available); b. cath 2014: LM nl, pLAD 40%, mLAD nl, ost LCx 40%, mid LCx nl, pRCA 30% @ site of prior stent, mRCA 50%  . Depression   . Diabetes mellitus without complication ([place])   . Diabetic neuropathy ([place])   . dialysis 2006  . ESRD (end stage renal disease) on dialysis ([place])    M-W-F  . GERD (gastroesophageal reflux disease)   . Headache   . Hx of pancreatitis 2015  . Hypertension   . Mitral regurgitation    a. echo 10/2013: EF 62%, noWMA, mildly dilated LA, mild to mod MR/TR, GR1DD  . Myocardial infarction ([place])   . Pneumonia   . Renal insufficiency    Pt is on dialysis on M,W + F.    Patient Active Problem List   Diagnosis Date Noted  . Cellulitis of lower extremity 07/29/2016  . Chronic venous insufficiency 07/29/2016  . Lymphedema 07/29/2016  . TIA (transient ischemic attack) 04/21/2016  . Altered mental status 04/08/2016  . Hyperammonemia ([place])  04/08/2016  . Elevated troponin 04/08/2016  . ESRD (end stage renal disease) ([place]) 04/08/2016  . Depression 04/08/2016  . Depression, major, recurrent, severe with psychosis ([employer]) 04/08/2016  . Blood in stool   . Intractable cyclical vomiting with nausea   . Reflux esophagitis   . Gastritis   . Generalized abdominal pain   . Uncontrollable vomiting   . Major depressive disorder, recurrent episode, moderate ([place]) 03/15/2016  . Adjustment disorder with mixed anxiety and depressed mood 03/15/2016  . Malnutrition of moderate degree 12/01/2015  . Renal mass   . Dyspnea   . Acute renal failure ([address])   . Respiratory failure ([place])   . High temperature 11/14/2015  . Pulmonary edema   . Encounter for central line placement   . Encounter for orogastric (OG) tube placement   . Nausea 11/12/2015  . Hyperkalemia 10/03/2015  . Diarrhea, unspecified 07/22/2015  . Pneumonia 05/21/2015  . Hypoglycemia 04/24/2015  . Unresponsiveness 04/24/2015  . Bradycardia 04/24/2015  . Hypothermia 04/24/2015  . Acute respiratory failure ([place]) 04/24/2015  . Acute diastolic CHF (congestive heart failure) ([place]) 04/05/2015  . Diabetic gastroparesis ([place]) 04/05/2015  . Hypokalemia 04/05/2015  . Generalized weakness 04/05/2015  . Acute pulmonary edema ([place]) 04/03/2015  . Nausea and vomiting 04/03/2015  . Hypoglycemia associated with diabetes ([place]) 04/03/2015  . Anemia of chronic disease 04/03/2015  . Secondary hyperparathyroidism ([place])  04/03/2015  . Pressure ulcer 04/02/2015  . Acute respiratory failure with hypoxia ([place]) 04/01/2015  . Adjustment disorder with anxiety 03/14/2015  . Somatic symptom disorder, mild 03/08/2015  . Coronary artery disease involving native coronary artery of native heart without angina pectoris   . Nausea & vomiting 03/06/2015  . Abdominal pain 03/06/2015  . DM (diabetes mellitus) ([place]) 03/06/2015  . HTN (hypertension) 03/06/2015  . Gastroparesis 02/24/2015  . Pleural effusion  02/19/2015  . HCAP (healthcare-associated pneumonia) 02/19/2015  . End-stage renal disease on hemodialysis ([place]) 02/19/2015    Past Surgical History:  Procedure Laterality Date  . ABDOMINAL HYSTERECTOMY    . APPENDECTOMY    . CARDIAC CATHETERIZATION Left 07/26/2015   Procedure: Left Heart Cath and Coronary Angiography;  Surgeon: Dionisio David, MD;  Location: [facility];  Service: Cardiovascular;  Laterality: Left;  . CHOLECYSTECTOMY    . COLONOSCOPY WITH PROPOFOL N/A 08/12/2016   Procedure: COLONOSCOPY WITH PROPOFOL;  Surgeon: Lollie Sails, MD;  Location: [facility] ENDOSCOPY;  Service: Endoscopy;  Laterality: N/A;  . DIALYSIS FISTULA CREATION Left    upper arm  . ESOPHAGOGASTRODUODENOSCOPY N/A 03/08/2015   Procedure: ESOPHAGOGASTRODUODENOSCOPY (EGD);  Surgeon: Manya Silvas, MD;  Location: [facility] ENDOSCOPY;  Service: Endoscopy;  Laterality: N/A;  . ESOPHAGOGASTRODUODENOSCOPY (EGD) WITH PROPOFOL N/A 03/18/2016   Procedure: ESOPHAGOGASTRODUODENOSCOPY (EGD) WITH PROPOFOL;  Surgeon: [name], MD;  Location: [facility] ENDOSCOPY;  Service: Endoscopy;  Laterality: N/A;  . EYE SURGERY    . FECAL TRANSPLANT N/A 08/23/2015   Procedure: FECAL TRANSPLANT;  Surgeon: Manya Silvas, MD;  Location: [facility] ENDOSCOPY;  Service: Endoscopy;  Laterality: N/A;  . PERIPHERAL VASCULAR CATHETERIZATION N/A 12/20/2015   Procedure: Thrombectomy of dialysis access versus permcath placement;  Surgeon: Algernon Huxley, MD;  Location: [place] [facility] LAB;  Service: Cardiovascular;  Laterality: N/A;  . PERIPHERAL VASCULAR CATHETERIZATION N/A 12/20/2015   Procedure: A/V Shunt Intervention;  Surgeon: Algernon Huxley, MD;  Location: [place] [facility];  Service: Cardiovascular;  Laterality: N/A;  . PERIPHERAL VASCULAR CATHETERIZATION N/A 12/20/2015   Procedure: A/V Shuntogram/Fistulagram;  Surgeon: Algernon Huxley, MD;  Location: [place] [facility];  Service: Cardiovascular;  Laterality: N/A;  . PERIPHERAL VASCULAR  CATHETERIZATION N/A 01/02/2016   Procedure: A/V Shuntogram/Fistulagram;  Surgeon: Algernon Huxley, MD;  Location: [place] [facility];  Service: Cardiovascular;  Laterality: N/A;  . PERIPHERAL VASCULAR CATHETERIZATION N/A 01/02/2016   Procedure: A/V Shunt Intervention;  Surgeon: Algernon Huxley, MD;  Location: [place] [facility];  Service: Cardiovascular;  Laterality: N/A;    Prior to Admission medications   Medication Sig Start Date End Date Taking? Authorizing Provider  albuterol (PROVENTIL HFA;VENTOLIN HFA) 108 (90 Base) MCG/ACT inhaler Inhale 2 puffs into the lungs every 6 (six) hours as needed for shortness of breath.     [provider]  amLODipine (NORVASC) 10 MG tablet Take 10 mg by mouth daily.    [provider]  aspirin EC 81 MG tablet Take 81 mg by mouth daily. 09/08/16   [provider]  atorvastatin (LIPITOR) 20 MG tablet  12/02/16   [provider]  cetirizine (ZYRTEC) 10 MG tablet Take 10 mg by mouth daily.     [provider]  Cholecalciferol (VITAMIN D-1000 MAX ST) 1000 units tablet Take by mouth. 03/30/10   [provider]  citalopram (CELEXA) 20 MG tablet Take by mouth. 03/30/10   [provider]  clobetasol ointment (TEMOVATE) 0.05 % Apply topically. 10/07/16  10/07/17  [provider]  clonazePAM (KLONOPIN) 0.5 MG tablet Take by mouth. 03/30/10   [provider]  clopidogrel (PLAVIX) 75 MG tablet  10/14/16   [provider]  diclofenac sodium (VOLTAREN) 1 % GEL Apply topically.    [provider]  docusate sodium (COLACE) 100 MG capsule Take by mouth. 03/30/10   [provider]  FLUoxetine (PROZAC) 20 MG capsule Take 1 capsule (20 mg total) by mouth daily. Patient taking differently: Take 20 mg by mouth at bedtime.  04/10/16   Hower, Aaron Mose, MD  gabapentin (NEURONTIN) 100 MG capsule Take 100 mg by mouth. 11/26/06   [provider]  glipiZIDE (GLUCOTROL XL) 10 MG 24 hr tablet  Take 5 mg by mouth. 03/30/10   [provider]  glucose 4 GM chewable tablet Chew 3-4 tablets by mouth as needed for low blood sugar.    [provider]  hydrALAZINE (APRESOLINE) 25 MG tablet Take 25 mg by mouth 3 (three) times daily.    [provider]  HYDROcodone-acetaminophen (NORCO/VICODIN) 5-325 MG tablet Take 1 tablet by mouth every 6 (six) hours as needed for moderate pain or severe pain. 04/29/16   Loletha Grayer, MD  hydrOXYzine (ATARAX/VISTARIL) 25 MG tablet Take 25 mg by mouth.    [provider]  insulin aspart (NOVOLOG) 100 UNIT/ML injection Inject 0-6 Units into the skin 3 (three) times daily before meals. Per sliding scale    [provider]  insulin detemir (LEVEMIR) 100 UNIT/ML injection Inject 6 Units into the skin at bedtime.    [provider]  lamoTRIgine (LAMICTAL) 25 MG tablet Take 25 mg by mouth.    [provider]  lanthanum (FOSRENOL) 500 MG chewable tablet Chew 500 mg by mouth. 04/02/07   [provider]  LEVEMIR FLEXTOUCH 100 UNIT/ML Pen  12/03/16   [provider]  lipase/protease/amylase (CREON) 12000 UNITS CPEP capsule Take 12,000-36,000 Units by mouth 3 (three) times daily with meals. Take 36000 mg by mouth three times daily with meals and 12000 mg with snacks.    [provider]  lubiprostone (AMITIZA) 24 MCG capsule Take by mouth. 07/13/16   [provider]  meclizine (ANTIVERT) 12.5 MG tablet Take 1 tablet (12.5 mg total) by mouth 3 (three) times daily as needed for dizziness. 04/29/16   Loletha Grayer, MD  metoCLOPramide (REGLAN) 10 MG tablet Take 10 mg by mouth. 03/30/10   [provider]  metoprolol (LOPRESSOR) 50 MG tablet Take 50 mg by mouth 2 (two) times daily.    [provider]  multivitamin (RENA-VIT) TABS tablet Take 1 tablet by mouth every evening.     [provider]  nitroGLYCERIN (NITROSTAT) 0.4 MG SL tablet Place 0.4 mg under the  tongue every 5 (five) minutes x 3 doses as needed.    [provider]  NOVOLOG FLEXPEN 100 UNIT/ML FlexPen  12/03/16   [provider]  OLANZapine (ZYPREXA) 5 MG tablet Take 0.5 tablets (2.5 mg total) by mouth at bedtime. Patient taking differently: Take 5 mg by mouth at bedtime.  04/29/16   Loletha Grayer, MD  omega-3 acid ethyl esters (LOVAZA) 1 g capsule Take 1 g by mouth 2 (two) times daily.     [provider]  Omega-3 Fatty Acids (FISH OIL PO) Take by mouth.    [provider]  ondansetron (ZOFRAN) 4 MG tablet Take 4 mg by mouth. 12/06/07   [provider]  pantoprazole (PROTONIX) 40 MG tablet  Take 1 tablet (40 mg total) by mouth 2 (two) times daily before a meal. 03/19/16   Gladstone Lighter, MD  pregabalin (LYRICA) 25 MG capsule Take 25 mg by mouth daily.    [provider]  Probiotic Product (GNP 4X PROBIOTIC) TABS Take by mouth.    [provider]  ranitidine (ZANTAC) 150 MG tablet Take 150 mg by mouth daily. In the afternoon    [provider]  sevelamer carbonate (RENVELA) 800 MG tablet Take 800 mg by mouth 3 (three) times daily with meals.     [provider]     Allergies Ace inhibitors; Compazine [prochlorperazine edisylate]; Sumatriptan succinate; Ativan [lorazepam]; Dilaudid [hydromorphone hcl]; Gabapentin; Losartan; Ondansetron; Prochlorperazine; Reglan [metoclopramide]; Scopolamine; Zofran [ondansetron hcl]; Codeine; Lac bovis; Oxycodone; Tape; and Tapentadol  Family History  Problem Relation Age of Onset  . Kidney disease Mother   . Diabetes Mother   . Cancer Father   . Kidney disease Sister     Social History Social History  Substance Use Topics  . Smoking status: Former Smoker    Packs/day: 0.50    Types: Cigarettes    Quit date: 02/13/2015  . Smokeless tobacco: Never Used  . Alcohol use 0.6 oz/week    1 Glasses of wine per week     Comment: glass wine week per pt    Review of  Systems  Constitutional: No fever/chills Eyes: No visual changes.  ENT: No Neck pain Cardiovascular: Denies chest pain. Respiratory: Denies shortness of breath. Gastrointestinal: No abdominal pain.  No nausea, no vomiting.   Genitourinary: Negative for dysuria. Musculoskeletal: Wrist pain as above Skin: Negative for rash. Neurological: Negative for headaches or focal weakness   ____________________________________________   PHYSICAL EXAM:  VITAL SIGNS: ED Triage Vitals  Enc Vitals Group     BP 02/19/17 0903 (!) 169/67     Pulse Rate 02/19/17 0903 76     Resp 02/19/17 0903 20     Temp --      Temp src --      SpO2 02/19/17 0903 100 %     Weight 02/19/17 0905 83.5 kg (184 lb)     Height 02/19/17 0905 1.664 m (5' 5.5")     Head Circumference --      Peak Flow --      Pain Score 02/19/17 0911 10     Pain Loc --      Pain Edu? --      Excl. in [place]? --     Constitutional: Alert and oriented. No acute distress. Pleasant and interactive Eyes: Conjunctivae are normal.  Head: Atraumatic. Nose: No congestion/rhinnorhea. Mouth/Throat: Mucous membranes are moist.   Neck:  Painless ROM, no vertebral tenderness to palpation Cardiovascular: Normal rate, regular rhythm. Grossly normal heart sounds.  Good peripheral circulation. Respiratory: Normal respiratory effort.  No retractions. Lungs CTAB. Gastrointestinal: Soft and nontender. No distention.  No CVA tenderness. Genitourinary: deferred Musculoskeletal: Left wrist tenderness to palpation, pain with range of motion. 2+ pulses, normal strength in her tendons. Normal cap refill, minimal swelling Neurologic:  Normal speech and language. No gross focal neurologic deficits are appreciated.  Skin:  Skin is warm, dry and intact. No rash noted. Psychiatric: Mood and affect are normal. Speech and behavior are normal.  ____________________________________________   LABS (all labs ordered are listed, but only abnormal results are  displayed)  Labs Reviewed  BASIC METABOLIC PANEL - Abnormal; Notable for the following:       Result Value  Sodium 133 (*)    Chloride 88 (*)    Glucose, Bld 344 (*)    Creatinine, Ser 4.56 (*)    Calcium 8.6 (*)    GFR calc non Af Amer 10 (*)    GFR calc Af Amer 11 (*)    All other components within normal limits  CBC - Abnormal; Notable for the following:    RDW 17.7 (*)    All other components within normal limits  GLUCOSE, CAPILLARY - Abnormal; Notable for the following:    Glucose-Capillary 276 (*)    All other components within normal limits  URINALYSIS, COMPLETE (UACMP) WITH MICROSCOPIC  CBG MONITORING, ED   ____________________________________________  EKG  ED ECG REPORT I, Lavonia Drafts, the attending physician, personally viewed and interpreted this ECG.  Date: 02/19/2017  Rate: 75 Rhythm: normal sinus rhythm QRS Axis: normal Intervals: normal ST/T Wave abnormalities: normal Conduction Disturbances: none Narrative Interpretation: unremarkable  ____________________________________________  RADIOLOGY  Impacted nondisplaced distal radius fracture ____________________________________________   PROCEDURES  Procedure(s) performed: No    Critical Care performed: No ____________________________________________   INITIAL IMPRESSION / ASSESSMENT AND PLAN / ED COURSE  Pertinent labs & imaging results that were available during my care of the patient were reviewed by me and considered in my medical decision making (see chart for details).  Lab work is overall reassuring. X-ray demonstrates distal radius fracture, splint placed on the patient, premade. She will follow-up with orthopedics. She plan to discuss with her PCP regarding her glucose as she has had difficulty over the last several months with nighttime "bottoming out".    ____________________________________________   FINAL CLINICAL IMPRESSION(S) / ED DIAGNOSES  Final diagnoses:  Closed  fracture of distal end of left radius, unspecified fracture morphology, initial encounter      NEW MEDICATIONS STARTED DURING THIS VISIT:  Discharge Medication List as of 02/19/2017 11:00 AM       Note:  This document was prepared using Dragon voice recognition software and may include unintentional dictation errors.    Lavonia Drafts, MD 02/19/17 1210

## 2017-03-04 ENCOUNTER — Encounter: Payer: Self-pay | Admitting: *Deleted

## 2017-03-10 ENCOUNTER — Ambulatory Visit: Payer: Medicare Other | Admitting: Anesthesiology

## 2017-03-10 ENCOUNTER — Encounter: Payer: Self-pay | Admitting: *Deleted

## 2017-03-10 ENCOUNTER — Ambulatory Visit
Admission: RE | Admit: 2017-03-10 | Discharge: 2017-03-10 | Disposition: A | Discharge status: Home / Self Care | Payer: Medicare Other | Source: Ambulatory Visit | Attending: Ophthalmology | Admitting: Ophthalmology

## 2017-03-10 ENCOUNTER — Encounter
Admission: RE | Disposition: A | Discharge status: Home / Self Care | Payer: Self-pay | Source: Ambulatory Visit | Attending: Ophthalmology

## 2017-03-10 DIAGNOSIS — F329 Major depressive disorder, single episode, unspecified: Secondary | ICD-10-CM | POA: Insufficient documentation

## 2017-03-10 DIAGNOSIS — Z87891 Personal history of nicotine dependence: Secondary | ICD-10-CM | POA: Insufficient documentation

## 2017-03-10 DIAGNOSIS — Z90710 Acquired absence of both cervix and uterus: Secondary | ICD-10-CM | POA: Insufficient documentation

## 2017-03-10 DIAGNOSIS — Z79899 Other long term (current) drug therapy: Secondary | ICD-10-CM | POA: Insufficient documentation

## 2017-03-10 DIAGNOSIS — H2512 Age-related nuclear cataract, left eye: Secondary | ICD-10-CM | POA: Diagnosis not present

## 2017-03-10 DIAGNOSIS — E7800 Pure hypercholesterolemia, unspecified: Secondary | ICD-10-CM | POA: Insufficient documentation

## 2017-03-10 DIAGNOSIS — Z885 Allergy status to narcotic agent status: Secondary | ICD-10-CM | POA: Diagnosis not present

## 2017-03-10 DIAGNOSIS — I252 Old myocardial infarction: Secondary | ICD-10-CM | POA: Diagnosis not present

## 2017-03-10 DIAGNOSIS — I509 Heart failure, unspecified: Secondary | ICD-10-CM | POA: Insufficient documentation

## 2017-03-10 DIAGNOSIS — K219 Gastro-esophageal reflux disease without esophagitis: Secondary | ICD-10-CM | POA: Insufficient documentation

## 2017-03-10 DIAGNOSIS — Z7982 Long term (current) use of aspirin: Secondary | ICD-10-CM | POA: Diagnosis not present

## 2017-03-10 DIAGNOSIS — Z888 Allergy status to other drugs, medicaments and biological substances status: Secondary | ICD-10-CM | POA: Diagnosis not present

## 2017-03-10 DIAGNOSIS — I2510 Atherosclerotic heart disease of native coronary artery without angina pectoris: Secondary | ICD-10-CM | POA: Insufficient documentation

## 2017-03-10 DIAGNOSIS — Z794 Long term (current) use of insulin: Secondary | ICD-10-CM | POA: Insufficient documentation

## 2017-03-10 DIAGNOSIS — I110 Hypertensive heart disease with heart failure: Secondary | ICD-10-CM | POA: Insufficient documentation

## 2017-03-10 DIAGNOSIS — Z8673 Personal history of transient ischemic attack (TIA), and cerebral infarction without residual deficits: Secondary | ICD-10-CM | POA: Diagnosis not present

## 2017-03-10 DIAGNOSIS — F419 Anxiety disorder, unspecified: Secondary | ICD-10-CM | POA: Insufficient documentation

## 2017-03-10 DIAGNOSIS — J449 Chronic obstructive pulmonary disease, unspecified: Secondary | ICD-10-CM | POA: Insufficient documentation

## 2017-03-10 DIAGNOSIS — M1990 Unspecified osteoarthritis, unspecified site: Secondary | ICD-10-CM | POA: Insufficient documentation

## 2017-03-10 DIAGNOSIS — E1151 Type 2 diabetes mellitus with diabetic peripheral angiopathy without gangrene: Secondary | ICD-10-CM | POA: Insufficient documentation

## 2017-03-10 HISTORY — DX: Personal history of other diseases of the digestive system: Z87.19

## 2017-03-10 HISTORY — DX: Dizziness and giddiness: R42

## 2017-03-10 HISTORY — DX: Hyperlipidemia, unspecified: E78.5

## 2017-03-10 HISTORY — DX: Orthopnea: R06.01

## 2017-03-10 HISTORY — PX: CATARACT EXTRACTION W/PHACO: SHX586

## 2017-03-10 HISTORY — DX: Wheezing: R06.2

## 2017-03-10 HISTORY — DX: Localized edema: R60.0

## 2017-03-10 HISTORY — DX: Angina pectoris, unspecified: I20.9

## 2017-03-10 HISTORY — DX: Diverticulosis of intestine, part unspecified, without perforation or abscess without bleeding: K57.90

## 2017-03-10 HISTORY — DX: Other allergic rhinitis: J30.89

## 2017-03-10 HISTORY — DX: Unspecified hearing loss, unspecified ear: H91.90

## 2017-03-10 HISTORY — DX: Malignant neoplasm of unspecified kidney, except renal pelvis: C64.9

## 2017-03-10 HISTORY — DX: Bronchitis, not specified as acute or chronic: J40

## 2017-03-10 HISTORY — DX: Fracture of unspecified carpal bone, unspecified wrist, initial encounter for closed fracture: S62.109A

## 2017-03-10 HISTORY — DX: Dyspnea, unspecified: R06.00

## 2017-03-10 LAB — GLUCOSE, CAPILLARY: Glucose-Capillary: 177 mg/dL — ABNORMAL HIGH (ref 65–99)

## 2017-03-10 LAB — POCT I-STAT 4, (NA,K, GLUC, HGB,HCT)
Glucose, Bld: 173 mg/dL — ABNORMAL HIGH (ref 65–99)
HCT: 31 % — ABNORMAL LOW (ref 36.0–46.0)
Hemoglobin: 10.5 g/dL — ABNORMAL LOW (ref 12.0–15.0)
Potassium: 5.1 mmol/L (ref 3.5–5.1)
Sodium: 136 mmol/L (ref 135–145)

## 2017-03-10 SURGERY — PHACOEMULSIFICATION, CATARACT, WITH IOL INSERTION
Anesthesia: Monitor Anesthesia Care | Site: Eye | Laterality: Left | Wound class: Clean

## 2017-03-10 MED ORDER — BSS IO SOLN
INTRAOCULAR | Status: DC | PRN
  Administered 2017-03-10: 1 mL via OPHTHALMIC

## 2017-03-10 MED ORDER — MOXIFLOXACIN HCL 0.5 % OP SOLN
OPHTHALMIC | Status: DC | PRN
  Administered 2017-03-10: .2 mL via OPHTHALMIC

## 2017-03-10 MED ORDER — FENTANYL CITRATE (PF) 100 MCG/2ML IJ SOLN
INTRAMUSCULAR | Status: AC
  Filled 2017-03-10: qty 2

## 2017-03-10 MED ORDER — EPINEPHRINE PF 1 MG/ML IJ SOLN
INTRAMUSCULAR | Status: AC
  Filled 2017-03-10: qty 2

## 2017-03-10 MED ORDER — LIDOCAINE HCL (PF) 4 % IJ SOLN
INTRAOCULAR | Status: DC | PRN
  Administered 2017-03-10: 2 mL via OPHTHALMIC

## 2017-03-10 MED ORDER — ARMC OPHTHALMIC DILATING DROPS
1.0000 "application " | OPHTHALMIC | Status: AC
  Administered 2017-03-10 (×3): 1 via OPHTHALMIC

## 2017-03-10 MED ORDER — CARBACHOL 0.01 % IO SOLN
INTRAOCULAR | Status: DC | PRN
  Administered 2017-03-10: .5 mL via INTRAOCULAR

## 2017-03-10 MED ORDER — SODIUM CHLORIDE 0.9 % IV SOLN
INTRAVENOUS | Status: DC
  Administered 2017-03-10: 10:00:00 via INTRAVENOUS
  Administered 2017-03-10: 20 mL/h via INTRAVENOUS

## 2017-03-10 MED ORDER — ONDANSETRON HCL 4 MG/2ML IJ SOLN
INTRAMUSCULAR | Status: DC | PRN
  Administered 2017-03-10: 4 mg via INTRAVENOUS

## 2017-03-10 MED ORDER — NA CHONDROIT SULF-NA HYALURON 40-17 MG/ML IO SOLN
INTRAOCULAR | Status: AC
  Filled 2017-03-10: qty 1

## 2017-03-10 MED ORDER — LIDOCAINE HCL (PF) 4 % IJ SOLN
INTRAMUSCULAR | Status: AC
  Filled 2017-03-10: qty 5

## 2017-03-10 MED ORDER — POVIDONE-IODINE 5 % OP SOLN
OPHTHALMIC | Status: AC
  Filled 2017-03-10: qty 30

## 2017-03-10 MED ORDER — ARMC OPHTHALMIC DILATING DROPS
OPHTHALMIC | Status: AC
  Administered 2017-03-10: 1 via OPHTHALMIC
  Filled 2017-03-10: qty 0.4

## 2017-03-10 MED ORDER — FENTANYL CITRATE (PF) 100 MCG/2ML IJ SOLN
INTRAMUSCULAR | Status: DC | PRN
  Administered 2017-03-10: 25 ug via INTRAVENOUS

## 2017-03-10 MED ORDER — POVIDONE-IODINE 5 % OP SOLN
OPHTHALMIC | Status: DC | PRN
  Administered 2017-03-10: 1 via OPHTHALMIC

## 2017-03-10 MED ORDER — FENTANYL CITRATE (PF) 100 MCG/2ML IJ SOLN: 25.0000 ug | INTRAMUSCULAR | Status: DC | PRN

## 2017-03-10 MED ORDER — NA CHONDROIT SULF-NA HYALURON 40-17 MG/ML IO SOLN
INTRAOCULAR | Status: DC | PRN
  Administered 2017-03-10: 1 mL via INTRAOCULAR

## 2017-03-10 MED ORDER — MOXIFLOXACIN HCL 0.5 % OP SOLN
1.0000 [drp] | OPHTHALMIC | Status: DC | PRN
Start: 2017-03-10 — End: 2017-03-10

## 2017-03-10 MED ORDER — ONDANSETRON HCL 4 MG/2ML IJ SOLN: 4.0000 mg | Freq: Once | INTRAMUSCULAR | Status: DC | PRN

## 2017-03-10 MED ORDER — DEXMEDETOMIDINE HCL IN NACL 200 MCG/50ML IV SOLN
INTRAVENOUS | Status: DC | PRN
  Administered 2017-03-10: 12 ug via INTRAVENOUS
  Administered 2017-03-10: 8 ug via INTRAVENOUS

## 2017-03-10 MED ORDER — MOXIFLOXACIN HCL 0.5 % OP SOLN
OPHTHALMIC | Status: AC
  Filled 2017-03-10: qty 3

## 2017-03-10 SURGICAL SUPPLY — 14 items
GLOVE BIO SURGEON STRL SZ8 (GLOVE) ×2 IMPLANT
GLOVE BIOGEL M 6.5 STRL (GLOVE) ×2 IMPLANT
GLOVE SURG LX 8.0 MICRO (GLOVE) ×1
GLOVE SURG LX STRL 8.0 MICRO (GLOVE) ×1 IMPLANT
GOWN STRL REUS W/ TWL LRG LVL3 (GOWN DISPOSABLE) ×2 IMPLANT
GOWN STRL REUS W/TWL LRG LVL3 (GOWN DISPOSABLE) ×2
LENS IOL ACRYSOF IQ 22.5 (Intraocular Lens) ×2 IMPLANT
PACK CATARACT (MISCELLANEOUS) ×2 IMPLANT
PACK CATARACT BRASINGTON LX (MISCELLANEOUS) ×2 IMPLANT
SOL BSS BAG (MISCELLANEOUS) ×2
SOLUTION BSS BAG (MISCELLANEOUS) ×1 IMPLANT
SYR 5ML LL (SYRINGE) ×2 IMPLANT
WATER STERILE IRR 250ML POUR (IV SOLUTION) ×2 IMPLANT
WIPE NON LINTING 3.25X3.25 (MISCELLANEOUS) ×2 IMPLANT

## 2017-03-10 NOTE — Anesthesia Postprocedure Evaluation (Signed)
Anesthesia Post Note  Patient: [name]  Procedure(s) Performed: Procedure(s) (LRB): CATARACT EXTRACTION PHACO AND INTRAOCULAR LENS PLACEMENT (IOC) (Left)  Patient location during evaluation: PACU Anesthesia Type: MAC Level of consciousness: awake and alert and oriented Pain management: pain level controlled Vital Signs Assessment: post-procedure vital signs reviewed and stable Respiratory status: spontaneous breathing Cardiovascular status: blood pressure returned to baseline Anesthetic complications: no     Last Vitals:  Vitals:   03/10/17 1000 03/10/17 1008  BP: (!) 103/29 (!) 110/40  Pulse: (!) 59 61  Resp: 16 16  Temp: 36.6 C     Last Pain:  Vitals:   03/10/17 0808  TempSrc: Oral  PainSc: 0-No pain                 Rebeca Valdivia

## 2017-03-10 NOTE — Transfer of Care (Signed)
Immediate Anesthesia Transfer of Care Note  Patient: [name]  Procedure(s) Performed: Procedure(s) with comments: CATARACT EXTRACTION PHACO AND INTRAOCULAR LENS PLACEMENT (IOC) (Left) - [place] 00:51.9 AP% 14.2 CDE 7.39 fluid pack lot # [id number]  Patient Location: PACU  Anesthesia Type:MAC  Level of Consciousness: sedated  Airway & Oxygen Therapy: Patient Spontanous Breathing and Patient connected to nasal cannula oxygen  Post-op Assessment: Report given to RN and Post -op Vital signs reviewed and stable  Post vital signs: Reviewed and stable  Last Vitals:  Vitals:   03/10/17 0808 03/10/17 1000  BP: (!) 129/46 (!) 103/29  Pulse: 66 (!) 59  Resp: 16 16  Temp: 36.8 C 36.6 C    Last Pain:  Vitals:   03/10/17 0808  TempSrc: Oral  PainSc: 0-No pain      Patients Stated Pain Goal: 0 (14/99/69 2493)  Complications: No apparent anesthesia complications

## 2017-03-10 NOTE — Discharge Instructions (Signed)
Eye Surgery Discharge Instructions  Expect mild scratchy sensation or mild soreness. DO NOT RUB YOUR EYE!  The day of surgery:  Minimal physical activity, but bed rest is not required  No reading, computer work, or close hand work  No bending, lifting, or straining.  May watch TV  For 24 hours:  No driving, legal decisions, or alcoholic beverages  Safety precautions  Eat anything you prefer: It is better to start with liquids, then soup then solid foods.  _____ Eye patch should be worn until postoperative exam tomorrow.  ____ Solar shield eyeglasses should be worn for comfort in the sunlight/patch while sleeping  Resume all regular medications including aspirin or Coumadin if these were discontinued prior to surgery. You may shower, bathe, shave, or wash your hair. Tylenol may be taken for mild discomfort.  Call your doctor if you experience significant pain, nausea, or vomiting, fever > 101 or other signs of infection. [phone] or [phone] Specific instructions:  Follow-up Information    Birder Robson, MD Follow up.   Specialty:  Ophthalmology Why:  June 20 at 10:10am Contact information: [address] [place] [address] [phone]

## 2017-03-10 NOTE — Anesthesia Post-op Follow-up Note (Cosign Needed)
Anesthesia QCDR form completed.        

## 2017-03-10 NOTE — Op Note (Signed)
PREOPERATIVE DIAGNOSIS:  Nuclear sclerotic cataract of the left eye.   POSTOPERATIVE DIAGNOSIS:  Nuclear sclerotic cataract of the left eye.   OPERATIVE PROCEDURE: Procedure(s): CATARACT EXTRACTION PHACO AND INTRAOCULAR LENS PLACEMENT (IOC)   SURGEON:  Birder Robson, MD.   ANESTHESIA:  Anesthesiologist: Alvin Critchley, MD CRNA: Justus Memory, CRNA  1.      Managed anesthesia care. 2.     0.55ml of Shugarcaine was instilled following the paracentesis   COMPLICATIONS:  None.   TECHNIQUE:   Stop and chop   DESCRIPTION OF PROCEDURE:  The patient was examined and consented in the preoperative holding area where the aforementioned topical anesthesia was applied to the left eye and then brought back to the Operating Room where the left eye was prepped and draped in the usual sterile ophthalmic fashion and a lid speculum was placed. A paracentesis was created with the side port blade and the anterior chamber was filled with viscoelastic. A near clear corneal incision was performed with the steel keratome. A continuous curvilinear capsulorrhexis was performed with a cystotome followed by the capsulorrhexis forceps. Hydrodissection and hydrodelineation were carried out with BSS on a blunt cannula. The lens was removed in a stop and chop  technique and the remaining cortical material was removed with the irrigation-aspiration handpiece. The capsular bag was inflated with viscoelastic and the Technis [id number] lens was placed in the capsular bag without complication. The remaining viscoelastic was removed from the eye with the irrigation-aspiration handpiece. The wounds were hydrated. The anterior chamber was flushed with Miostat and the eye was inflated to physiologic pressure. 0.49ml Vigamox was placed in the anterior chamber. The wounds were found to be water tight. The eye was dressed with Vigamox. The patient was given protective glasses to wear throughout the day and a shield with which to sleep  tonight. The patient was also given drops with which to begin a drop regimen today and will follow-up with me in one day.  Implant Name Type Inv. Item Serial No. Manufacturer Lot No. LRB No. Used  LENS IOL ACRYSOF IQ 22.5 - [id number] Intraocular Lens LENS IOL ACRYSOF IQ 22.5 [id number] [employer]   Left 1    Procedure(s) with comments: CATARACT EXTRACTION PHACO AND INTRAOCULAR LENS PLACEMENT (IOC) (Left) - [place] 00:51.9 AP% 14.2 CDE 7.39 fluid pack lot # [id number]  Electronically signed: [place] 03/10/2017 10:00 AM

## 2017-03-10 NOTE — H&P (Signed)
All labs reviewed. Abnormal studies sent to patients PCP when indicated.  Previous H&P reviewed, patient examined, there are NO CHANGES.  Robin [place] LOUIS6/19/20189:30 AM

## 2017-03-10 NOTE — Anesthesia Preprocedure Evaluation (Signed)
Anesthesia Evaluation  Patient identified by MRN, date of birth, ID band Patient awake    Reviewed: Allergy & Precautions, NPO status , Patient's Chart, lab work & pertinent test results  Airway Mallampati: II       Dental  (+) Upper Dentures, Lower Dentures   Pulmonary shortness of breath and with exertion, asthma , COPD, former smoker,     + decreased breath sounds      Cardiovascular Exercise Tolerance: Poor hypertension, + angina with exertion + CAD, + Past MI, + Peripheral Vascular Disease, +CHF and + Orthopnea   Rhythm:Regular     Neuro/Psych  Headaches, PSYCHIATRIC DISORDERS Anxiety Depression TIA   GI/Hepatic Neg liver ROS, hiatal hernia, GERD  Medicated,  Endo/Other  diabetes, Type 2  Renal/GU DialysisRenal disease     Musculoskeletal  (+) Arthritis , Osteoarthritis,    Abdominal Normal abdominal exam  (+)   Peds  Hematology  (+) anemia ,   Anesthesia Other Findings   Reproductive/Obstetrics                             Anesthesia Physical  Anesthesia Plan  ASA: IV  Anesthesia Plan: MAC   Post-op Pain Management:    Induction: Intravenous  PONV Risk Score and Plan:   Airway Management Planned: Nasal Cannula  Additional Equipment:   Intra-op Plan:   Post-operative Plan:   Informed Consent: I have reviewed the patients History and Physical, chart, labs and discussed the procedure including the risks, benefits and alternatives for the proposed anesthesia with the patient or authorized representative who has indicated his/her understanding and acceptance.     Plan Discussed with: CRNA  Anesthesia Plan Comments:         Anesthesia Quick Evaluation

## 2017-03-10 NOTE — Transfer of Care (Signed)
Immediate Anesthesia Transfer of Care Note  Patient: [name]  Procedure(s) Performed: Procedure(s) with comments: CATARACT EXTRACTION PHACO AND INTRAOCULAR LENS PLACEMENT (IOC) (Left) - [place] 00:51.9 AP% 14.2 CDE 7.39 fluid pack lot # [id number]  Patient Location: PACU  Anesthesia Type:MAC  Level of Consciousness: sedated  Airway & Oxygen Therapy: Patient Spontanous Breathing and Patient connected to nasal cannula oxygen  Post-op Assessment: Report given to RN and Post -op Vital signs reviewed and stable  Post vital signs: Reviewed and stable  Last Vitals:  Vitals:   03/10/17 0808 03/10/17 1000  BP: (!) 129/46 (!) 103/29  Pulse: 66 (!) 59  Resp: 16 16  Temp: 36.8 C 36.6 C    Last Pain:  Vitals:   03/10/17 0808  TempSrc: Oral  PainSc: 0-No pain      Patients Stated Pain Goal: 0 (19/41/74 0814)  Complications: No apparent anesthesia complications

## 2017-03-12 ENCOUNTER — Encounter: Payer: Self-pay | Admitting: Oncology

## 2017-03-12 ENCOUNTER — Inpatient Hospital Stay: Payer: Medicare Other

## 2017-03-12 ENCOUNTER — Inpatient Hospital Stay: Payer: Medicare Other | Attending: Oncology | Admitting: Oncology

## 2017-03-12 VITALS — BP 139/67 | HR 71 | Temp 97.4°F | Resp 18 | Wt 179.4 lb

## 2017-03-12 DIAGNOSIS — I10 Essential (primary) hypertension: Secondary | ICD-10-CM | POA: Diagnosis not present

## 2017-03-12 DIAGNOSIS — F329 Major depressive disorder, single episode, unspecified: Secondary | ICD-10-CM | POA: Diagnosis not present

## 2017-03-12 DIAGNOSIS — I252 Old myocardial infarction: Secondary | ICD-10-CM | POA: Diagnosis not present

## 2017-03-12 DIAGNOSIS — Z87891 Personal history of nicotine dependence: Secondary | ICD-10-CM | POA: Insufficient documentation

## 2017-03-12 DIAGNOSIS — E785 Hyperlipidemia, unspecified: Secondary | ICD-10-CM

## 2017-03-12 DIAGNOSIS — M8580 Other specified disorders of bone density and structure, unspecified site: Secondary | ICD-10-CM | POA: Diagnosis not present

## 2017-03-12 DIAGNOSIS — I340 Nonrheumatic mitral (valve) insufficiency: Secondary | ICD-10-CM

## 2017-03-12 DIAGNOSIS — K449 Diaphragmatic hernia without obstruction or gangrene: Secondary | ICD-10-CM | POA: Insufficient documentation

## 2017-03-12 DIAGNOSIS — Z7982 Long term (current) use of aspirin: Secondary | ICD-10-CM

## 2017-03-12 DIAGNOSIS — K219 Gastro-esophageal reflux disease without esophagitis: Secondary | ICD-10-CM | POA: Insufficient documentation

## 2017-03-12 DIAGNOSIS — E1122 Type 2 diabetes mellitus with diabetic chronic kidney disease: Secondary | ICD-10-CM

## 2017-03-12 DIAGNOSIS — R531 Weakness: Secondary | ICD-10-CM | POA: Insufficient documentation

## 2017-03-12 DIAGNOSIS — N186 End stage renal disease: Secondary | ICD-10-CM | POA: Insufficient documentation

## 2017-03-12 DIAGNOSIS — D472 Monoclonal gammopathy: Secondary | ICD-10-CM

## 2017-03-12 DIAGNOSIS — Z992 Dependence on renal dialysis: Secondary | ICD-10-CM | POA: Diagnosis not present

## 2017-03-12 DIAGNOSIS — K859 Acute pancreatitis without necrosis or infection, unspecified: Secondary | ICD-10-CM | POA: Insufficient documentation

## 2017-03-12 DIAGNOSIS — R5383 Other fatigue: Secondary | ICD-10-CM | POA: Insufficient documentation

## 2017-03-12 DIAGNOSIS — M545 Low back pain: Secondary | ICD-10-CM

## 2017-03-12 DIAGNOSIS — E119 Type 2 diabetes mellitus without complications: Secondary | ICD-10-CM | POA: Diagnosis not present

## 2017-03-12 DIAGNOSIS — R778 Other specified abnormalities of plasma proteins: Secondary | ICD-10-CM

## 2017-03-12 DIAGNOSIS — M1990 Unspecified osteoarthritis, unspecified site: Secondary | ICD-10-CM | POA: Insufficient documentation

## 2017-03-12 DIAGNOSIS — R8290 Unspecified abnormal findings in urine: Secondary | ICD-10-CM

## 2017-03-12 DIAGNOSIS — I2510 Atherosclerotic heart disease of native coronary artery without angina pectoris: Secondary | ICD-10-CM | POA: Diagnosis not present

## 2017-03-12 DIAGNOSIS — Z85528 Personal history of other malignant neoplasm of kidney: Secondary | ICD-10-CM | POA: Insufficient documentation

## 2017-03-12 DIAGNOSIS — E1140 Type 2 diabetes mellitus with diabetic neuropathy, unspecified: Secondary | ICD-10-CM | POA: Diagnosis not present

## 2017-03-12 DIAGNOSIS — I129 Hypertensive chronic kidney disease with stage 1 through stage 4 chronic kidney disease, or unspecified chronic kidney disease: Secondary | ICD-10-CM

## 2017-03-12 DIAGNOSIS — I5032 Chronic diastolic (congestive) heart failure: Secondary | ICD-10-CM | POA: Diagnosis not present

## 2017-03-12 DIAGNOSIS — F419 Anxiety disorder, unspecified: Secondary | ICD-10-CM | POA: Insufficient documentation

## 2017-03-12 DIAGNOSIS — Z79899 Other long term (current) drug therapy: Secondary | ICD-10-CM

## 2017-03-12 LAB — COMPREHENSIVE METABOLIC PANEL
ALT: 14 U/L (ref 14–54)
AST: 23 U/L (ref 15–41)
Albumin: 4.1 g/dL (ref 3.5–5.0)
Alkaline Phosphatase: 166 U/L — ABNORMAL HIGH (ref 38–126)
Anion gap: 12 (ref 5–15)
BUN: 23 mg/dL — ABNORMAL HIGH (ref 6–20)
CO2: 31 mmol/L (ref 22–32)
Calcium: 8.5 mg/dL — ABNORMAL LOW (ref 8.9–10.3)
Chloride: 91 mmol/L — ABNORMAL LOW (ref 101–111)
Creatinine, Ser: 5.18 mg/dL — ABNORMAL HIGH (ref 0.44–1.00)
GFR calc Af Amer: 10 mL/min — ABNORMAL LOW (ref >60)
GFR calc non Af Amer: 8 mL/min — ABNORMAL LOW (ref >60)
Glucose, Bld: 249 mg/dL — ABNORMAL HIGH (ref 65–99)
Potassium: 4.7 mmol/L (ref 3.5–5.1)
Sodium: 134 mmol/L — ABNORMAL LOW (ref 135–145)
Total Bilirubin: 0.7 mg/dL (ref 0.3–1.2)
Total Protein: 8.8 g/dL — ABNORMAL HIGH (ref 6.5–8.1)

## 2017-03-12 LAB — FERRITIN: Ferritin: 1060 ng/mL — ABNORMAL HIGH (ref 11–307)

## 2017-03-12 LAB — CBC WITH DIFFERENTIAL/PLATELET
Basophils Absolute: 0.1 10*3/uL (ref 0–0.1)
Basophils Relative: 1 %
Eosinophils Absolute: 0.6 10*3/uL (ref 0–0.7)
Eosinophils Relative: 9 %
HCT: 34 % — ABNORMAL LOW (ref 35.0–47.0)
Hemoglobin: 11.1 g/dL — ABNORMAL LOW (ref 12.0–16.0)
Lymphocytes Relative: 39 %
Lymphs Abs: 2.6 10*3/uL (ref 1.0–3.6)
MCH: 30.9 pg (ref 26.0–34.0)
MCHC: 32.7 g/dL (ref 32.0–36.0)
MCV: 94.3 fL (ref 80.0–100.0)
Monocytes Absolute: 0.5 10*3/uL (ref 0.2–0.9)
Monocytes Relative: 7 %
Neutro Abs: 3 10*3/uL (ref 1.4–6.5)
Neutrophils Relative %: 44 %
Platelets: 214 10*3/uL (ref 150–440)
RBC: 3.6 MIL/uL — ABNORMAL LOW (ref 3.80–5.20)
RDW: 17.3 % — ABNORMAL HIGH (ref 11.5–14.5)
WBC: 6.8 10*3/uL (ref 3.6–11.0)

## 2017-03-12 LAB — VITAMIN B12: Vitamin B-12: 2870 pg/mL — ABNORMAL HIGH (ref 180–914)

## 2017-03-12 LAB — IRON AND TIBC
Iron: 80 ug/dL (ref 28–170)
Saturation Ratios: 36 % — ABNORMAL HIGH (ref 10.4–31.8)
TIBC: 226 ug/dL — ABNORMAL LOW (ref 250–450)
UIBC: 146 ug/dL

## 2017-03-12 LAB — FOLATE: Folate: 40 ng/mL (ref >5.9)

## 2017-03-12 NOTE — Progress Notes (Signed)
Here for f/u. Per pt had eye surgery on 03/10/17-had cataract removed and new lense placed- L eye . Broke L arm 2 w ago- falling .

## 2017-03-12 NOTE — Progress Notes (Signed)
Hematology/Oncology Consult note [facility]  [phone] [phone]  Patient Care Team: Ellamae Sia, MD as PCP - General (Internal Medicine)   Name of the patient: [name]  [id number]  1960/11/26   Date of visit: 03/12/17  Diagnosis- M spike in urine  Chief complaint/ Reason for visit- routine f/u  Heme/Onc history: patient is a 56 year old female with a past medical history significant for coronary artery disease, type 2 diabetes with neuropathy, hypertension hyperlipidemia and osteopenia arthritis. She has ESRD secondary to HTN/DM and has been on dialysis for about 14 years. She was seen by rheumatology for complaints of low back pain. She also had painful subcutaneous skin rashes and was seen by dermatology and biopsy of one of the lesions showed mild septal panicky like this. As a part of workup by rheumatology doctor Dr. Lezlie Lye- Meda Coffee she had SPEP done which came back abnormal with the presence of M spike and hence has been referred to [place]. There was still some ongoing concern about possible lupus and she follows up with rheumatology. Her last colonoscopy was in November 2017 which showed tubular adenoma and a repeat colonoscopy was recommended in 5 years.  Her recent bloodwork was as follows: CBC from 01/08/2017 showed white count of 6.7, H&H of 13.9/42.8 and a platelet count of 161. CMP showed elevated BUN of 43 and creatinine of 5.22. Calcium was normal at 9.7. Total protein was mildly elevated at 8.6. An albumin was normal at 3.9. SPEP from 11/06/2016 did not reveal any M spike. M spike of 4.2% was noted on 24-hour urine protein.   Complete myeloma work up was planned during initial visit on 01/29/17. However blood draw was unable to be done  Interval history- she continues to get her dialysis. Reports fatigue. Left wrist broke after a fall 2 weeks agoDenies other complaints today   Review of systems- Review of Systems    Constitutional: Positive for malaise/fatigue. Negative for chills, fever and weight loss.  HENT: Negative for congestion, ear discharge and nosebleeds.   Eyes: Negative for blurred vision.  Respiratory: Negative for cough, hemoptysis, sputum production, shortness of breath and wheezing.   Cardiovascular: Negative for chest pain, palpitations, orthopnea and claudication.  Gastrointestinal: Negative for abdominal pain, blood in stool, constipation, diarrhea, heartburn, melena, nausea and vomiting.  Genitourinary: Negative for dysuria, flank pain, frequency, hematuria and urgency.  Musculoskeletal: Negative for back pain, joint pain and myalgias.  Skin: Negative for rash.  Neurological: Negative for dizziness, tingling, focal weakness, seizures, weakness and headaches.  Endo/Heme/Allergies: Does not bruise/bleed easily.  Psychiatric/Behavioral: Negative for depression and suicidal ideas. The patient does not have insomnia.       Allergies  Allergen Reactions  . Ace Inhibitors Swelling and Anaphylaxis  . Ativan [Lorazepam] Other (See Comments)    Reaction:Hallucinations and headaches Reaction:Hallucinations and headaches Reaction:  Hallucinations and headaches  . Compazine [Prochlorperazine Edisylate] Anaphylaxis and Nausea And Vomiting    Other reaction(s): dystonia from this vs. reglan 23 Jul - patient relates that she takes promethazine frequently with no problems. 23 Jul - patient relates that she takes promethazine frequently with no problems.  . Sumatriptan Succinate     Other reaction(s): delirium and hallucinations per [facility] records  . Dilaudid [Hydromorphone Hcl] Other (See Comments)    Delirium   . Ondansetron Other (See Comments)    hallucinations    . Zofran [Ondansetron Hcl] Other (See Comments)    Reaction:  hallucinations  Reaction:  hallucinations   . Codeine Nausea And Vomiting  . Gabapentin Other (See Comments)    Reaction:  Unknown    . [place] Nausea  And Vomiting  . Losartan Nausea Only  . Oxycodone Anxiety  . Prochlorperazine Other (See Comments)    Reaction:  Unknown . Patient does not remember reaction but she does have vertigo and anxiety along with n and v at times. Could be used to treat any of these   . Reglan [Metoclopramide]     Per patient her Dr. Evelina Bucy her off it  Per patient her Dr. Evelina Bucy her off it   . Scopolamine Other (See Comments)    Dizziness, also has vertigo already  . Tape Rash  . Tapentadol Rash     Past Medical History:  Diagnosis Date  . Anemia   . Anginal pain ([place])   . Anxiety   . Arthritis   . Asthma   . Broken wrist   . Bronchitis   . chronic diastolic CHF 0/35/4656  . COPD (chronic obstructive pulmonary disease) ([place])   . Coronary artery disease    a. cath 2013: stenting to RCA (report not available); b. cath 2014: LM nl, pLAD 40%, mLAD nl, ost LCx 40%, mid LCx nl, pRCA 30% @ site of prior stent, mRCA 50%  . Depression   . Diabetes mellitus without complication ([place])   . Diabetic neuropathy ([place])   . dialysis 2006  . Diverticulosis   . Dizziness   . Dyspnea   . Elevated lipids   . Environmental and seasonal allergies   . ESRD (end stage renal disease) on dialysis ([place])    M-W-F  . GERD (gastroesophageal reflux disease)   . Headache   . History of hiatal hernia   . HOH (hard of hearing)   . Hx of pancreatitis 2015  . Hypertension   . Lower extremity edema   . Mitral regurgitation    a. echo 10/2013: EF 62%, noWMA, mildly dilated LA, mild to mod MR/TR, GR1DD  . Myocardial infarction ([place])   . Orthopnea   . Pneumonia   . Renal cancer ([place])   . Renal insufficiency    Pt is on dialysis on M,W + F.  . Wheezing      Past Surgical History:  Procedure Laterality Date  . ABDOMINAL HYSTERECTOMY  1992  . APPENDECTOMY    . CARDIAC CATHETERIZATION Left 07/26/2015   Procedure: Left Heart Cath and Coronary Angiography;  Surgeon: Dionisio David, MD;  Location: [place] [facility];  Service:  Cardiovascular;  Laterality: Left;  . CATARACT EXTRACTION W/ INTRAOCULAR LENS IMPLANT Right   . CATARACT EXTRACTION W/PHACO Left 03/10/2017   Procedure: CATARACT EXTRACTION PHACO AND INTRAOCULAR LENS PLACEMENT (IOC);  Surgeon: Birder Robson, MD;  Location: [facility] ORS;  Service: Ophthalmology;  Laterality: Left;  [place] 00:51.9 AP% 14.2 CDE 7.39 fluid pack lot # [id number]  . CHOLECYSTECTOMY    . COLONOSCOPY WITH PROPOFOL N/A 08/12/2016   Procedure: COLONOSCOPY WITH PROPOFOL;  Surgeon: Lollie Sails, MD;  Location: [facility] ENDOSCOPY;  Service: Endoscopy;  Laterality: N/A;  . DIALYSIS FISTULA CREATION Left    upper arm  . dialysis grafts    . ESOPHAGOGASTRODUODENOSCOPY N/A 03/08/2015   Procedure: ESOPHAGOGASTRODUODENOSCOPY (EGD);  Surgeon: Manya Silvas, MD;  Location: [facility] ENDOSCOPY;  Service: Endoscopy;  Laterality: N/A;  . ESOPHAGOGASTRODUODENOSCOPY (EGD) WITH PROPOFOL N/A 03/18/2016   Procedure: ESOPHAGOGASTRODUODENOSCOPY (EGD) WITH PROPOFOL;  Surgeon: [name], MD;  Location: [facility] ENDOSCOPY;  Service:  Endoscopy;  Laterality: N/A;  . EYE SURGERY Right 2018  . FECAL TRANSPLANT N/A 08/23/2015   Procedure: FECAL TRANSPLANT;  Surgeon: Manya Silvas, MD;  Location: [facility] ENDOSCOPY;  Service: Endoscopy;  Laterality: N/A;  . HAND SURGERY Bilateral   . PERIPHERAL VASCULAR CATHETERIZATION N/A 12/20/2015   Procedure: Thrombectomy of dialysis access versus permcath placement;  Surgeon: Algernon Huxley, MD;  Location: [place] [facility];  Service: Cardiovascular;  Laterality: N/A;  . PERIPHERAL VASCULAR CATHETERIZATION N/A 12/20/2015   Procedure: A/V Shunt Intervention;  Surgeon: Algernon Huxley, MD;  Location: [place] [facility];  Service: Cardiovascular;  Laterality: N/A;  . PERIPHERAL VASCULAR CATHETERIZATION N/A 12/20/2015   Procedure: A/V Shuntogram/Fistulagram;  Surgeon: Algernon Huxley, MD;  Location: [place] [facility];  Service: Cardiovascular;  Laterality: N/A;  . PERIPHERAL VASCULAR  CATHETERIZATION N/A 01/02/2016   Procedure: A/V Shuntogram/Fistulagram;  Surgeon: Algernon Huxley, MD;  Location: [place] [facility];  Service: Cardiovascular;  Laterality: N/A;  . PERIPHERAL VASCULAR CATHETERIZATION N/A 01/02/2016   Procedure: A/V Shunt Intervention;  Surgeon: Algernon Huxley, MD;  Location: [place] [facility];  Service: Cardiovascular;  Laterality: N/A;    Social History   Social History  . Marital status: Divorced    Spouse name: N/A  . Number of children: N/A  . Years of education: N/A   Occupational History  . Not on file.   Social History Main Topics  . Smoking status: Former Smoker    Packs/day: 0.50    Types: Cigarettes    Quit date: 02/13/2015  . Smokeless tobacco: Never Used  . Alcohol use 0.6 oz/week    1 Glasses of wine per week     Comment: glass wine week per pt  . Drug use: No  . Sexual activity: No   Other Topics Concern  . Not on file   Social History Narrative  . No narrative on file    Family History  Problem Relation Age of Onset  . Kidney disease Mother   . Diabetes Mother   . Cancer Father   . Kidney disease Sister      Current Outpatient Prescriptions:  .  acidophilus (RISAQUAD) CAPS capsule, Take 1 capsule by mouth daily with lunch. , Disp: , Rfl:  .  amLODipine (NORVASC) 10 MG tablet, Take 10 mg by mouth daily., Disp: , Rfl:  .  aspirin EC 81 MG tablet, Take 81 mg by mouth daily., Disp: , Rfl:  .  atorvastatin (LIPITOR) 20 MG tablet, Take 20 mg by mouth at bedtime. , Disp: , Rfl:  .  cetirizine (ZYRTEC) 10 MG tablet, Take 10 mg by mouth daily. , Disp: , Rfl:  .  clobetasol ointment (TEMOVATE) 0.93 %, Apply 1 application topically daily. 1 application to legs daily, Disp: , Rfl:  .  clopidogrel (PLAVIX) 75 MG tablet, Take 75 mg by mouth daily. , Disp: , Rfl:  .  cyanocobalamin 1000 MCG tablet, Take 1,000 mcg by mouth daily with lunch. , Disp: , Rfl:  .  diclofenac sodium (VOLTAREN) 1 % GEL, Apply 2 g topically 2 (two) times daily  as needed (for leg pain). , Disp: , Rfl:  .  diphenhydrAMINE (BENADRYL) 25 mg capsule, Take 25 mg by mouth every 6 (six) hours as needed for itching., Disp: , Rfl:  .  FLUoxetine (PROZAC) 20 MG capsule, Take 1 capsule (20 mg total) by mouth daily. (Patient taking differently: Take 20 mg by mouth at bedtime. ), Disp:  30 capsule, Rfl: 0 .  HYDROcodone-acetaminophen (NORCO/VICODIN) 5-325 MG tablet, Take 1 tablet by mouth every 6 (six) hours as needed for moderate pain or severe pain. (Patient taking differently: Take 1 tablet by mouth 3 (three) times daily as needed (for severe pain.). ), Disp: 15 tablet, Rfl: 0 .  LEVEMIR FLEXTOUCH 100 UNIT/ML Pen, Inject 4 Units into the skin 2 (two) times daily. , Disp: , Rfl:  .  lipase/protease/amylase (CREON) 12000 UNITS CPEP capsule, Take 24,000-36,000 Units by mouth 3 (three) times daily with meals. Take 36000 mg in the morning, 24000 midday & 36000 in the evening, Disp: , Rfl:  .  multivitamin (RENA-VIT) TABS tablet, Take 1 tablet by mouth daily. , Disp: , Rfl:  .  NOVOLOG FLEXPEN 100 UNIT/ML FlexPen, Inject 4-12 Units into the skin 3 (three) times daily before meals. Per sliding scale, Disp: , Rfl:  .  OLANZapine (ZYPREXA) 5 MG tablet, Take 0.5 tablets (2.5 mg total) by mouth at bedtime. (Patient taking differently: Take 5 mg by mouth at bedtime. ), Disp: 30 tablet, Rfl: 0 .  omega-3 acid ethyl esters (LOVAZA) 1 g capsule, Take 1 g by mouth daily. , Disp: , Rfl:  .  pantoprazole (PROTONIX) 40 MG tablet, Take 1 tablet (40 mg total) by mouth 2 (two) times daily before a meal., Disp: 60 tablet, Rfl: 2 .  promethazine (PHENERGAN) 25 MG tablet, Take 12.5 mg by mouth every 6 (six) hours as needed for nausea or vomiting., Disp: , Rfl:  .  ranitidine (ZANTAC) 150 MG tablet, Take 150 mg by mouth at bedtime. In the afternoon, Disp: , Rfl:  .  rifaximin (XIFAXAN) 550 MG TABS tablet, Take 550 mg by mouth 2 (two) times daily. , Disp: , Rfl:  .  sevelamer carbonate (RENVELA)  800 MG tablet, Take 800 mg by mouth 3 (three) times daily with meals. , Disp: , Rfl:  .  albuterol (PROVENTIL HFA;VENTOLIN HFA) 108 (90 Base) MCG/ACT inhaler, Inhale 2 puffs into the lungs every 6 (six) hours as needed for shortness of breath. , Disp: , Rfl:  .  albuterol (PROVENTIL) (2.5 MG/3ML) 0.083% nebulizer solution, Take 2.5 mg by nebulization every 6 (six) hours as needed for wheezing or shortness of breath., Disp: , Rfl:  .  glucose 4 GM chewable tablet, Chew 3-4 tablets by mouth as needed for low blood sugar (blood sugar less than 60). , Disp: , Rfl:  .  meclizine (ANTIVERT) 12.5 MG tablet, meclizine 12.5 mg tablet, Disp: , Rfl:  .  naloxone (NARCAN) 0.4 MG/ML injection, Inject 0.4 mg into the vein daily as needed (for opoid overdose)., Disp: , Rfl:  .  nitroGLYCERIN (NITROSTAT) 0.4 MG SL tablet, Place 0.4 mg under the tongue every 5 (five) minutes x 3 doses as needed., Disp: , Rfl:  .  ondansetron (ZOFRAN) 4 MG tablet, Take 4 mg by mouth., Disp: , Rfl:  .  polyethylene glycol (MIRALAX / GLYCOLAX) packet, Take 17 g by mouth daily., Disp: , Rfl:  .  triamcinolone cream (KENALOG) 0.1 %, Apply 1 application topically 2 (two) times daily., Disp: , Rfl:   Physical exam:  Vitals:   03/12/17 1035  BP: 139/67  Pulse: 71  Resp: 18  Temp: 97.4 F (36.3 C)  TempSrc: Tympanic  Weight: 179 lb 6.4 oz (81.4 kg)   Physical Exam  Constitutional: She is oriented to person, place, and time and well-developed, well-nourished, and in no distress.  Sitting in a wheelchair  HENT:  Head:  Normocephalic and atraumatic.  Eyes: EOM are normal. Pupils are equal, round, and reactive to light.  Dark glasses due to recent cataract surgery  Neck: Normal range of motion.  Cardiovascular: Normal rate, regular rhythm and normal heart sounds.   Pulmonary/Chest: Effort normal and breath sounds normal.  Abdominal: Soft. Bowel sounds are normal.  Musculoskeletal: She exhibits edema.  Left forearm has a cast in  place  Neurological: She is alert and oriented to person, place, and time.  Skin: Skin is warm and dry.     CMP Latest Ref Rng & Units 03/12/2017  Glucose 65 - 99 mg/dL 249(H)  BUN 6 - 20 mg/dL 23(H)  Creatinine 0.44 - 1.00 mg/dL 5.18(H)  Sodium 135 - 145 mmol/L 134(L)  Potassium 3.5 - 5.1 mmol/L 4.7  Chloride 101 - 111 mmol/L 91(L)  CO2 22 - 32 mmol/L 31  Calcium 8.9 - 10.3 mg/dL 8.5(L)  Total Protein 6.5 - 8.1 g/dL 8.8(H)  Total Bilirubin 0.3 - 1.2 mg/dL 0.7  Alkaline Phos 38 - 126 U/L 166(H)  AST 15 - 41 U/L 23  ALT 14 - 54 U/L 14   CBC Latest Ref Rng & Units 03/12/2017  WBC 3.6 - 11.0 K/uL 6.8  Hemoglobin 12.0 - 16.0 g/dL 11.1(L)  Hematocrit 35.0 - 47.0 % 34.0(L)  Platelets 150 - 440 K/uL 214    No images are attached to the encounter.  Dg Forearm Left  Result Date: 02/19/2017 CLINICAL DATA:  Extremity pain and swelling EXAM: LEFT FOREARM - 2 VIEW COMPARISON:  None. FINDINGS: There is a nondisplaced mildly impacted fracture of the distal radial metaphysis. There is no other fracture or dislocation. Soft tissues are unremarkable. IMPRESSION: Nondisplaced mildly impacted fracture of the distal radial metaphysis. Electronically Signed   By: Kathreen Devoid   On: 02/19/2017 09:49   Dg Wrist Complete Left  Result Date: 02/19/2017 CLINICAL DATA:  Left wrist pain. EXAM: LEFT WRIST - COMPLETE 3+ VIEW COMPARISON:  None. FINDINGS: There is a nondisplaced mildly impacted fracture of the distal radial metaphysis. There is no other fracture or dislocation. Alignment is anatomic. No soft tissue abnormality. IMPRESSION: Nondisplaced mildly impacted fracture of the distal radial metaphysis. Electronically Signed   By: Kathreen Devoid   On: 02/19/2017 09:50     Assessment and plan- Patient is a 56 y.o. female referred for M spike in urine  We will re attempt blood draw today. Obtain cbc, cmp myeloma panel, serum free light chains and random upep and urine IFE. Patient unable to provide 24 hour  urine sample. rtc in 2-3 weeks   Visit Diagnosis 1. Abnormal urine findings      Dr. Randa Evens, MD, MPH [facility] at [facility] Pager- [phone] 03/12/2017 10:02 PM

## 2017-03-13 LAB — KAPPA/LAMBDA LIGHT CHAINS
Kappa free light chain: 253.5 mg/L — ABNORMAL HIGH (ref 3.3–19.4)
Kappa, lambda light chain ratio: 1.15 (ref 0.26–1.65)
Lambda free light chains: 221 mg/L — ABNORMAL HIGH (ref 5.7–26.3)

## 2017-03-16 LAB — MULTIPLE MYELOMA PANEL, SERUM
Albumin SerPl Elph-Mcnc: 4.2 g/dL (ref 2.9–4.4)
Albumin/Glob SerPl: 1.1 (ref 0.7–1.7)
Alpha 1: 0.3 g/dL (ref 0.0–0.4)
Alpha2 Glob SerPl Elph-Mcnc: 0.9 g/dL (ref 0.4–1.0)
B-Globulin SerPl Elph-Mcnc: 1.1 g/dL (ref 0.7–1.3)
Gamma Glob SerPl Elph-Mcnc: 1.6 g/dL (ref 0.4–1.8)
Globulin, Total: 3.9 g/dL (ref 2.2–3.9)
IgA: 413 mg/dL — ABNORMAL HIGH (ref 87–352)
IgG (Immunoglobin G), Serum: 1594 mg/dL (ref 700–1600)
IgM, Serum: 109 mg/dL (ref 26–217)
Total Protein ELP: 8.1 g/dL (ref 6.0–8.5)

## 2017-03-20 ENCOUNTER — Emergency Department
Admission: EM | Admit: 2017-03-20 | Discharge: 2017-03-21 | Disposition: A | Discharge status: Home / Self Care | Payer: Medicare Other | Source: Home / Self Care | Attending: Emergency Medicine | Admitting: Emergency Medicine

## 2017-03-20 ENCOUNTER — Encounter: Payer: Self-pay | Admitting: *Deleted

## 2017-03-20 DIAGNOSIS — Z7982 Long term (current) use of aspirin: Secondary | ICD-10-CM | POA: Insufficient documentation

## 2017-03-20 DIAGNOSIS — E1122 Type 2 diabetes mellitus with diabetic chronic kidney disease: Secondary | ICD-10-CM | POA: Insufficient documentation

## 2017-03-20 DIAGNOSIS — I2510 Atherosclerotic heart disease of native coronary artery without angina pectoris: Secondary | ICD-10-CM

## 2017-03-20 DIAGNOSIS — I5032 Chronic diastolic (congestive) heart failure: Secondary | ICD-10-CM

## 2017-03-20 DIAGNOSIS — Z87891 Personal history of nicotine dependence: Secondary | ICD-10-CM

## 2017-03-20 DIAGNOSIS — N2889 Other specified disorders of kidney and ureter: Secondary | ICD-10-CM

## 2017-03-20 DIAGNOSIS — J449 Chronic obstructive pulmonary disease, unspecified: Secondary | ICD-10-CM | POA: Insufficient documentation

## 2017-03-20 DIAGNOSIS — G8929 Other chronic pain: Secondary | ICD-10-CM

## 2017-03-20 DIAGNOSIS — Z79899 Other long term (current) drug therapy: Secondary | ICD-10-CM | POA: Insufficient documentation

## 2017-03-20 DIAGNOSIS — Z7902 Long term (current) use of antithrombotics/antiplatelets: Secondary | ICD-10-CM

## 2017-03-20 DIAGNOSIS — R079 Chest pain, unspecified: Secondary | ICD-10-CM | POA: Diagnosis not present

## 2017-03-20 DIAGNOSIS — Z794 Long term (current) use of insulin: Secondary | ICD-10-CM | POA: Insufficient documentation

## 2017-03-20 DIAGNOSIS — N186 End stage renal disease: Secondary | ICD-10-CM

## 2017-03-20 DIAGNOSIS — J45909 Unspecified asthma, uncomplicated: Secondary | ICD-10-CM

## 2017-03-20 DIAGNOSIS — Z992 Dependence on renal dialysis: Secondary | ICD-10-CM | POA: Insufficient documentation

## 2017-03-20 DIAGNOSIS — I110 Hypertensive heart disease with heart failure: Secondary | ICD-10-CM

## 2017-03-20 DIAGNOSIS — R109 Unspecified abdominal pain: Secondary | ICD-10-CM | POA: Insufficient documentation

## 2017-03-20 DIAGNOSIS — I132 Hypertensive heart and chronic kidney disease with heart failure and with stage 5 chronic kidney disease, or end stage renal disease: Secondary | ICD-10-CM

## 2017-03-20 LAB — CBC
HCT: 31 % — ABNORMAL LOW (ref 35.0–47.0)
Hemoglobin: 10.4 g/dL — ABNORMAL LOW (ref 12.0–16.0)
MCH: 31 pg (ref 26.0–34.0)
MCHC: 33.4 g/dL (ref 32.0–36.0)
MCV: 92.8 fL (ref 80.0–100.0)
Platelets: 223 10*3/uL (ref 150–440)
RBC: 3.34 MIL/uL — ABNORMAL LOW (ref 3.80–5.20)
RDW: 17.2 % — ABNORMAL HIGH (ref 11.5–14.5)
WBC: 5.6 10*3/uL (ref 3.6–11.0)

## 2017-03-20 LAB — COMPREHENSIVE METABOLIC PANEL
ALT: 15 U/L (ref 14–54)
AST: 21 U/L (ref 15–41)
Albumin: 3.6 g/dL (ref 3.5–5.0)
Alkaline Phosphatase: 152 U/L — ABNORMAL HIGH (ref 38–126)
Anion gap: 11 (ref 5–15)
BUN: 14 mg/dL (ref 6–20)
CO2: 29 mmol/L (ref 22–32)
Calcium: 7.9 mg/dL — ABNORMAL LOW (ref 8.9–10.3)
Chloride: 95 mmol/L — ABNORMAL LOW (ref 101–111)
Creatinine, Ser: 3.35 mg/dL — ABNORMAL HIGH (ref 0.44–1.00)
GFR calc Af Amer: 16 mL/min — ABNORMAL LOW (ref >60)
GFR calc non Af Amer: 14 mL/min — ABNORMAL LOW (ref >60)
Glucose, Bld: 239 mg/dL — ABNORMAL HIGH (ref 65–99)
Potassium: 3.7 mmol/L (ref 3.5–5.1)
Sodium: 135 mmol/L (ref 135–145)
Total Bilirubin: 0.8 mg/dL (ref 0.3–1.2)
Total Protein: 7.7 g/dL (ref 6.5–8.1)

## 2017-03-20 LAB — LIPASE, BLOOD: Lipase: 20 U/L (ref 11–51)

## 2017-03-20 MED ORDER — FAMOTIDINE IN NACL 20-0.9 MG/50ML-% IV SOLN
20.0000 mg | Freq: Once | INTRAVENOUS | Status: DC
  Filled 2017-03-20: qty 50

## 2017-03-20 MED ORDER — HALOPERIDOL LACTATE 5 MG/ML IJ SOLN
5.0000 mg | Freq: Once | INTRAMUSCULAR | Status: AC
  Administered 2017-03-20: 5 mg via INTRAVENOUS
  Filled 2017-03-20: qty 1

## 2017-03-20 MED ORDER — DIPHENHYDRAMINE HCL 50 MG/ML IJ SOLN: 25.0000 mg | Freq: Once | INTRAMUSCULAR | Status: DC

## 2017-03-20 NOTE — ED Notes (Signed)
Eliezer Lofts, RN reports 2 unsuccessful PIV attempts. Pt requesting an EJ be placed for IV access at this time; Dr Owens Shark to be made aware.

## 2017-03-20 NOTE — ED Notes (Signed)
Pt up to restroom. Pt able to ambulate independently but reports having return of pain. MD made aware.

## 2017-03-20 NOTE — ED Provider Notes (Addendum)
[facility] Emergency Department Provider Note  ____________________________________________  Time seen: Approximately 10:58 PM  I have reviewed the triage vital signs and the nursing notes.   HISTORY  Chief Complaint Abdominal Pain    HPI [name] is a 56 y.o. female who complains of vomiting and right upper quadrant and epigastric abdominal pain since 10:00 this morning during dialysis. She completed her dialysis session. She has a history of chronic recurrent abdominal pain associated with dialysis. She denies fever chills chest pain or shortness of breath. Pain is nonradiating, severe, without aggravating or alleviating factors.     Past Medical History:  Diagnosis Date  . Anemia   . Anginal pain ([place])   . Anxiety   . Arthritis   . Asthma   . Broken wrist   . Bronchitis   . chronic diastolic CHF 11/17/3333  . COPD (chronic obstructive pulmonary disease) ([place])   . Coronary artery disease    a. cath 2013: stenting to RCA (report not available); b. cath 2014: LM nl, pLAD 40%, mLAD nl, ost LCx 40%, mid LCx nl, pRCA 30% @ site of prior stent, mRCA 50%  . Depression   . Diabetes mellitus without complication ([place])   . Diabetic neuropathy ([place])   . dialysis 2006  . Diverticulosis   . Dizziness   . Dyspnea   . Elevated lipids   . Environmental and seasonal allergies   . ESRD (end stage renal disease) on dialysis ([place])    M-W-F  . GERD (gastroesophageal reflux disease)   . Headache   . History of hiatal hernia   . HOH (hard of hearing)   . Hx of pancreatitis 2015  . Hypertension   . Lower extremity edema   . Mitral regurgitation    a. echo 10/2013: EF 62%, noWMA, mildly dilated LA, mild to mod MR/TR, GR1DD  . Myocardial infarction ([place])   . Orthopnea   . Pneumonia   . Renal cancer ([place])   . Renal insufficiency    Pt is on dialysis on M,W + F.  . Wheezing      Patient Active Problem List   Diagnosis Date Noted  . Cellulitis of lower  extremity 07/29/2016  . Chronic venous insufficiency 07/29/2016  . Lymphedema 07/29/2016  . TIA (transient ischemic attack) 04/21/2016  . Altered mental status 04/08/2016  . Hyperammonemia ([place]) 04/08/2016  . Elevated troponin 04/08/2016  . ESRD (end stage renal disease) ([place]) 04/08/2016  . Depression 04/08/2016  . Depression, major, recurrent, severe with psychosis ([place]) 04/08/2016  . Blood in stool   . Intractable cyclical vomiting with nausea   . Reflux esophagitis   . Gastritis   . Generalized abdominal pain   . Uncontrollable vomiting   . Major depressive disorder, recurrent episode, moderate ([place]) 03/15/2016  . Adjustment disorder with mixed anxiety and depressed mood 03/15/2016  . Malnutrition of moderate degree 12/01/2015  . Renal mass   . Dyspnea   . Acute renal failure ([place])   . Respiratory failure ([place])   . High temperature 11/14/2015  . Pulmonary edema   . Encounter for central line placement   . Encounter for orogastric (OG) tube placement   . Nausea 11/12/2015  . Hyperkalemia 10/03/2015  . Diarrhea, unspecified 07/22/2015  . Pneumonia 05/21/2015  . Hypoglycemia 04/24/2015  . Unresponsiveness 04/24/2015  . Bradycardia 04/24/2015  . Hypothermia 04/24/2015  . Acute respiratory failure ([place]) 04/24/2015  . Acute diastolic CHF (congestive heart failure) ([place]) 04/05/2015  .  Diabetic gastroparesis ([place]) 04/05/2015  . Hypokalemia 04/05/2015  . Generalized weakness 04/05/2015  . Acute pulmonary edema ([place]) 04/03/2015  . Nausea and vomiting 04/03/2015  . Hypoglycemia associated with diabetes ([place]) 04/03/2015  . Anemia of chronic disease 04/03/2015  . Secondary hyperparathyroidism ([place]) 04/03/2015  . Pressure ulcer 04/02/2015  . Acute respiratory failure with hypoxia ([place]) 04/01/2015  . Adjustment disorder with anxiety 03/14/2015  . Somatic symptom disorder, mild 03/08/2015  . Coronary artery disease involving native coronary artery of native heart without angina  pectoris   . Nausea & vomiting 03/06/2015  . Abdominal pain 03/06/2015  . DM (diabetes mellitus) ([place]) 03/06/2015  . HTN (hypertension) 03/06/2015  . Gastroparesis 02/24/2015  . Pleural effusion 02/19/2015  . HCAP (healthcare-associated pneumonia) 02/19/2015  . End-stage renal disease on hemodialysis ([place]) 02/19/2015     Past Surgical History:  Procedure Laterality Date  . ABDOMINAL HYSTERECTOMY  1992  . APPENDECTOMY    . CARDIAC CATHETERIZATION Left 07/26/2015   Procedure: Left Heart Cath and Coronary Angiography;  Surgeon: Dionisio David, MD;  Location: [place] [facility];  Service: Cardiovascular;  Laterality: Left;  . CATARACT EXTRACTION W/ INTRAOCULAR LENS IMPLANT Right   . CATARACT EXTRACTION W/PHACO Left 03/10/2017   Procedure: CATARACT EXTRACTION PHACO AND INTRAOCULAR LENS PLACEMENT (IOC);  Surgeon: Birder Robson, MD;  Location: [facility] ORS;  Service: Ophthalmology;  Laterality: Left;  [place] 00:51.9 AP% 14.2 CDE 7.39 fluid pack lot # [id number]  . CHOLECYSTECTOMY    . COLONOSCOPY WITH PROPOFOL N/A 08/12/2016   Procedure: COLONOSCOPY WITH PROPOFOL;  Surgeon: Lollie Sails, MD;  Location: [facility] ENDOSCOPY;  Service: Endoscopy;  Laterality: N/A;  . DIALYSIS FISTULA CREATION Left    upper arm  . dialysis grafts    . ESOPHAGOGASTRODUODENOSCOPY N/A 03/08/2015   Procedure: ESOPHAGOGASTRODUODENOSCOPY (EGD);  Surgeon: Manya Silvas, MD;  Location: [facility] ENDOSCOPY;  Service: Endoscopy;  Laterality: N/A;  . ESOPHAGOGASTRODUODENOSCOPY (EGD) WITH PROPOFOL N/A 03/18/2016   Procedure: ESOPHAGOGASTRODUODENOSCOPY (EGD) WITH PROPOFOL;  Surgeon: [name], MD;  Location: [facility] ENDOSCOPY;  Service: Endoscopy;  Laterality: N/A;  . EYE SURGERY Right 2018  . FECAL TRANSPLANT N/A 08/23/2015   Procedure: FECAL TRANSPLANT;  Surgeon: Manya Silvas, MD;  Location: [facility] ENDOSCOPY;  Service: Endoscopy;  Laterality: N/A;  . HAND SURGERY Bilateral   . PERIPHERAL VASCULAR CATHETERIZATION N/A 12/20/2015    Procedure: Thrombectomy of dialysis access versus permcath placement;  Surgeon: Algernon Huxley, MD;  Location: [place] [facility];  Service: Cardiovascular;  Laterality: N/A;  . PERIPHERAL VASCULAR CATHETERIZATION N/A 12/20/2015   Procedure: A/V Shunt Intervention;  Surgeon: Algernon Huxley, MD;  Location: [place] [facility];  Service: Cardiovascular;  Laterality: N/A;  . PERIPHERAL VASCULAR CATHETERIZATION N/A 12/20/2015   Procedure: A/V Shuntogram/Fistulagram;  Surgeon: Algernon Huxley, MD;  Location: [place] [facility];  Service: Cardiovascular;  Laterality: N/A;  . PERIPHERAL VASCULAR CATHETERIZATION N/A 01/02/2016   Procedure: A/V Shuntogram/Fistulagram;  Surgeon: Algernon Huxley, MD;  Location: [place] [facility];  Service: Cardiovascular;  Laterality: N/A;  . PERIPHERAL VASCULAR CATHETERIZATION N/A 01/02/2016   Procedure: A/V Shunt Intervention;  Surgeon: Algernon Huxley, MD;  Location: [place] [facility];  Service: Cardiovascular;  Laterality: N/A;     Prior to Admission medications   Medication Sig Start Date End Date Taking? Authorizing Provider  acidophilus (RISAQUAD) CAPS capsule Take 1 capsule by mouth daily with lunch.     [provider]  albuterol (PROVENTIL HFA;VENTOLIN HFA) 108 (90 Base)  MCG/ACT inhaler Inhale 2 puffs into the lungs every 6 (six) hours as needed for shortness of breath.     [provider]  albuterol (PROVENTIL) (2.5 MG/3ML) 0.083% nebulizer solution Take 2.5 mg by nebulization every 6 (six) hours as needed for wheezing or shortness of breath.    [provider]  amLODipine (NORVASC) 10 MG tablet Take 10 mg by mouth daily.    [provider]  aspirin EC 81 MG tablet Take 81 mg by mouth daily. 09/08/16   [provider]  atorvastatin (LIPITOR) 20 MG tablet Take 20 mg by mouth at bedtime.  12/02/16   [provider]  cetirizine (ZYRTEC) 10 MG tablet Take 10 mg by mouth daily.     [provider]  clobetasol  ointment (TEMOVATE) 6.75 % Apply 1 application topically daily. 1 application to legs daily 10/07/16 10/07/17  [provider]  clopidogrel (PLAVIX) 75 MG tablet Take 75 mg by mouth daily.  10/14/16   [provider]  cyanocobalamin 1000 MCG tablet Take 1,000 mcg by mouth daily with lunch.     [provider]  diclofenac sodium (VOLTAREN) 1 % GEL Apply 2 g topically 2 (two) times daily as needed (for leg pain).     [provider]  diphenhydrAMINE (BENADRYL) 25 mg capsule Take 25 mg by mouth every 6 (six) hours as needed for itching.    [provider]  FLUoxetine (PROZAC) 20 MG capsule Take 1 capsule (20 mg total) by mouth daily. Patient taking differently: Take 20 mg by mouth at bedtime.  04/10/16   Hower, Aaron Mose, MD  glucose 4 GM chewable tablet Chew 3-4 tablets by mouth as needed for low blood sugar (blood sugar less than 60).     [provider]  HYDROcodone-acetaminophen (NORCO/VICODIN) 5-325 MG tablet Take 1 tablet by mouth every 6 (six) hours as needed for moderate pain or severe pain. Patient taking differently: Take 1 tablet by mouth 3 (three) times daily as needed (for severe pain.).  04/29/16   Loletha Grayer, MD  LEVEMIR FLEXTOUCH 100 UNIT/ML Pen Inject 4 Units into the skin 2 (two) times daily.  12/03/16   [provider]  lipase/protease/amylase (CREON) 12000 UNITS CPEP capsule Take 24,000-36,000 Units by mouth 3 (three) times daily with meals. Take 36000 mg in the morning, 24000 midday & 36000 in the evening    [provider]  meclizine (ANTIVERT) 12.5 MG tablet meclizine 12.5 mg tablet    [provider]  multivitamin (RENA-VIT) TABS tablet Take 1 tablet by mouth daily.     [provider]  naloxone [facility]) 0.4 MG/ML injection Inject 0.4 mg into the vein daily as needed (for opoid overdose).    [provider]  nitroGLYCERIN (NITROSTAT) 0.4 MG SL tablet Place 0.4 mg under the tongue every  5 (five) minutes x 3 doses as needed.    [provider]  NOVOLOG FLEXPEN 100 UNIT/ML FlexPen Inject 4-12 Units into the skin 3 (three) times daily before meals. Per sliding scale 12/03/16   [provider]  OLANZapine (ZYPREXA) 5 MG tablet Take 0.5 tablets (2.5 mg total) by mouth at bedtime. Patient taking differently: Take 5 mg by mouth at bedtime.  04/29/16   Wieting, Richard, MD  omega-3 acid ethyl esters (LOVAZA) 1 g capsule Take 1 g by mouth daily.     [provider]  ondansetron (ZOFRAN) 4 MG tablet Take 4 mg by mouth. 12/06/07   [provider]  pantoprazole (PROTONIX) 40 MG tablet Take 1 tablet (40 mg total) by mouth 2 (two) times daily before a meal. 03/19/16   Gladstone Lighter, MD  polyethylene glycol (MIRALAX / GLYCOLAX) packet Take 17 g by mouth daily.    [provider]  promethazine (PHENERGAN) 25 MG tablet Take 12.5 mg by mouth every 6 (six) hours as needed for nausea or vomiting.    [provider]  ranitidine (ZANTAC) 150 MG tablet Take 150 mg by mouth at bedtime. In the afternoon    [provider]  rifaximin (XIFAXAN) 550 MG TABS tablet Take 550 mg by mouth 2 (two) times daily.     [provider]  sevelamer carbonate (RENVELA) 800 MG tablet Take 800 mg by mouth 3 (three) times daily with meals.     [provider]  triamcinolone cream (KENALOG) 0.1 % Apply 1 application topically 2 (two) times daily.    [provider]     Allergies Ace inhibitors; Ativan [lorazepam]; Compazine [prochlorperazine edisylate]; Sumatriptan succinate; Dilaudid [hydromorphone hcl]; Ondansetron; Zofran [ondansetron hcl]; Codeine; Gabapentin; Lac bovis; Losartan; Oxycodone; Prochlorperazine; Reglan [metoclopramide]; Scopolamine; Tape; and Tapentadol   Family History  Problem Relation Age of Onset  . Kidney disease Mother   . Diabetes Mother   . Cancer Father   . Kidney disease Sister     Social  History Social History  Substance Use Topics  . Smoking status: Former Smoker    Packs/day: 0.50    Types: Cigarettes    Quit date: 02/13/2015  . Smokeless tobacco: Never Used  . Alcohol use 0.6 oz/week    1 Glasses of wine per week     Comment: glass wine week per pt    Review of Systems  Constitutional:   No fever or chills.  ENT:   No sore throat. No rhinorrhea. Cardiovascular:   No chest pain or syncope. Respiratory:   No dyspnea or cough. Gastrointestinal:   Positive abdominal pain and vomiting as above  Musculoskeletal:   Negative for focal pain or swelling All other systems reviewed and are negative except as documented above in ROS and HPI.  ____________________________________________   PHYSICAL EXAM:  VITAL SIGNS: ED Triage Vitals  Enc Vitals Group     BP 03/20/17 1955 (!) 156/71     Pulse Rate 03/20/17 1955 77     Resp 03/20/17 1955 19     Temp 03/20/17 1955 (!) 96.4 F (35.8 C)     Temp Source 03/20/17 1955 Axillary     SpO2 03/20/17 1955 100 %     Weight 03/20/17 1954 179 lb (81.2 kg)     Height 03/20/17 1954 5\' 5"  (1.651 m)     Head Circumference --      Peak Flow --      Pain Score 03/20/17 2112 0     Pain Loc --      Pain Edu? --      Excl. in [place]? --     Vital signs reviewed, nursing assessments reviewed.   Constitutional:   Alert and oriented. Well appearing and in no distress. Eyes:   No scleral icterus.  EOMI. No nystagmus. No conjunctival pallor. PERRL. ENT   Head:   Normocephalic and atraumatic.   Nose:   No congestion/rhinnorhea.    Mouth/Throat:   MMM, no pharyngeal erythema. No peritonsillar mass.    Neck:   No meningismus. Full ROM Hematological/Lymphatic/Immunilogical:   No cervical lymphadenopathy. Cardiovascular:   RRR. Symmetric bilateral radial and  DP pulses.  No murmurs.  Respiratory:   Normal respiratory effort without tachypnea/retractions. Breath sounds are clear and equal bilaterally. No  wheezes/rales/rhonchi. Gastrointestinal:   Soft with mild epigastric tenderness. Non distended. There is no CVA tenderness.  No rebound, rigidity, or guarding. Genitourinary:   deferred Musculoskeletal:   Normal range of motion in all extremities. No joint effusions.  No lower extremity tenderness.  No edema. Neurologic:   Normal speech and language.  Motor grossly intact. No gross focal neurologic deficits are appreciated.  Skin:    Skin is warm, dry and intact. No rash noted.  No petechiae, purpura, or bullae.  ____________________________________________    LABS (pertinent positives/negatives) (all labs ordered are listed, but only abnormal results are displayed) Labs Reviewed  COMPREHENSIVE METABOLIC PANEL - Abnormal; Notable for the following:       Result Value   Chloride 95 (*)    Glucose, Bld 239 (*)    Creatinine, Ser 3.35 (*)    Calcium 7.9 (*)    Alkaline Phosphatase 152 (*)    GFR calc non Af Amer 14 (*)    GFR calc Af Amer 16 (*)    All other components within normal limits  CBC - Abnormal; Notable for the following:    RBC 3.34 (*)    Hemoglobin 10.4 (*)    HCT 31.0 (*)    RDW 17.2 (*)    All other components within normal limits  LIPASE, BLOOD  URINALYSIS, COMPLETE (UACMP) WITH MICROSCOPIC   ____________________________________________   EKG    ____________________________________________    RADIOLOGY  No results found.  ____________________________________________   PROCEDURES Procedures  ____________________________________________   INITIAL IMPRESSION / ASSESSMENT AND PLAN / ED COURSE  Pertinent labs & imaging results that were available during my care of the patient were reviewed by me and considered in my medical decision making (see chart for details).  Patient well appearing no acute distress, presents with a recurrence of her chronic abdominal pain pattern with vomiting.She is well known to this ED and has had multiple  presentations for similar complaints. She recently had a CT scan of her abdomen 01/29/2017. Only concerning finding was that previously noted renal masses are concerning for renal cell carcinoma. I mentioned this to the patient and advised her that she needs to Continue following up with her doctor. She has seen urology Dr. Erlene Quan for this and discussed the results of this CT scan and has a plan to follow up annually for repeat imaging. She agrees.   Labs otherwise unremarkable, and I doubt any serious intra-abdominal pathology today.Considering the patient's symptoms, medical history, and physical examination today, I have low suspicion for cholecystitis or biliary pathology, pancreatitis, perforation or bowel obstruction, hernia, intra-abdominal abscess, AAA or dissection, volvulus or intussusception, mesenteric ischemia, or appendicitis.  Follow-up with primary care  In good condition for discharge home.      ____________________________________________   FINAL CLINICAL IMPRESSION(S) / ED DIAGNOSES  Final diagnoses:  Chronic abdominal pain      New Prescriptions   No medications on file     Portions of this note were generated with dragon dictation software. Dictation errors may occur despite best attempts at proofreading.    Carrie Mew, MD 03/20/17 [place]    Carrie Mew, MD 03/20/17 [phone]

## 2017-03-20 NOTE — ED Triage Notes (Signed)
Pt arrived to ED from home reporting vomiting and upper right abd pain since 10:00 this morning while pt was at dialysis. Pt reports she completed her treatment. Hx of pancreatitis. Pt is vomiting yellow bile upon arrival and unable to answer further questions.

## 2017-03-20 NOTE — ED Notes (Signed)
2 unsuccessful PIV attempt by this RN.

## 2017-03-21 MED ORDER — DIPHENHYDRAMINE HCL 12.5 MG/5ML PO ELIX
25.0000 mg | ORAL_SOLUTION | Freq: Once | ORAL | Status: AC
  Administered 2017-03-21: 25 mg via ORAL
  Filled 2017-03-21: qty 10

## 2017-03-21 MED ORDER — DIPHENHYDRAMINE HCL 12.5 MG/5ML PO ELIX
ORAL_SOLUTION | ORAL | Status: AC
  Filled 2017-03-21: qty 5

## 2017-03-21 MED ORDER — FAMOTIDINE 20 MG PO TABS
20.0000 mg | ORAL_TABLET | Freq: Once | ORAL | Status: AC
  Administered 2017-03-21: 20 mg via ORAL
  Filled 2017-03-21: qty 1

## 2017-03-21 NOTE — ED Notes (Signed)
Spoke with Dr Owens Shark regarding difficulty obtaining PIV access for this pt despite multiple attempts by several RNs; VORB to change ordered medications from IV to PO.

## 2017-03-21 NOTE — ED Notes (Addendum)
Spoke with Vitalia Stough (pt's son [phone]) who stated he would be providing transportation for the patient after d/c; informed Mr. Leven that d/c would be in the next 30-45 minutes.

## 2017-03-23 ENCOUNTER — Inpatient Hospital Stay
Admission: EM | Admit: 2017-03-23 | Discharge: 2017-03-25 | DRG: 291 | Disposition: A | Discharge status: Home / Self Care | Payer: Medicare Other | Attending: Internal Medicine | Admitting: Internal Medicine

## 2017-03-23 ENCOUNTER — Encounter: Payer: Self-pay | Admitting: Emergency Medicine

## 2017-03-23 ENCOUNTER — Emergency Department: Payer: Medicare Other

## 2017-03-23 DIAGNOSIS — K3184 Gastroparesis: Secondary | ICD-10-CM | POA: Diagnosis present

## 2017-03-23 DIAGNOSIS — M1990 Unspecified osteoarthritis, unspecified site: Secondary | ICD-10-CM | POA: Diagnosis present

## 2017-03-23 DIAGNOSIS — I132 Hypertensive heart and chronic kidney disease with heart failure and with stage 5 chronic kidney disease, or end stage renal disease: Secondary | ICD-10-CM | POA: Diagnosis present

## 2017-03-23 DIAGNOSIS — Z833 Family history of diabetes mellitus: Secondary | ICD-10-CM

## 2017-03-23 DIAGNOSIS — F329 Major depressive disorder, single episode, unspecified: Secondary | ICD-10-CM | POA: Diagnosis present

## 2017-03-23 DIAGNOSIS — E1122 Type 2 diabetes mellitus with diabetic chronic kidney disease: Secondary | ICD-10-CM | POA: Diagnosis present

## 2017-03-23 DIAGNOSIS — R079 Chest pain, unspecified: Secondary | ICD-10-CM | POA: Diagnosis present

## 2017-03-23 DIAGNOSIS — E785 Hyperlipidemia, unspecified: Secondary | ICD-10-CM | POA: Diagnosis present

## 2017-03-23 DIAGNOSIS — Z794 Long term (current) use of insulin: Secondary | ICD-10-CM | POA: Diagnosis not present

## 2017-03-23 DIAGNOSIS — Z992 Dependence on renal dialysis: Secondary | ICD-10-CM | POA: Diagnosis not present

## 2017-03-23 DIAGNOSIS — Z955 Presence of coronary angioplasty implant and graft: Secondary | ICD-10-CM

## 2017-03-23 DIAGNOSIS — N2581 Secondary hyperparathyroidism of renal origin: Secondary | ICD-10-CM | POA: Diagnosis present

## 2017-03-23 DIAGNOSIS — Z79899 Other long term (current) drug therapy: Secondary | ICD-10-CM | POA: Diagnosis not present

## 2017-03-23 DIAGNOSIS — J811 Chronic pulmonary edema: Secondary | ICD-10-CM | POA: Diagnosis present

## 2017-03-23 DIAGNOSIS — F419 Anxiety disorder, unspecified: Secondary | ICD-10-CM | POA: Diagnosis present

## 2017-03-23 DIAGNOSIS — I2510 Atherosclerotic heart disease of native coronary artery without angina pectoris: Secondary | ICD-10-CM | POA: Diagnosis present

## 2017-03-23 DIAGNOSIS — R110 Nausea: Secondary | ICD-10-CM | POA: Diagnosis present

## 2017-03-23 DIAGNOSIS — H9190 Unspecified hearing loss, unspecified ear: Secondary | ICD-10-CM | POA: Diagnosis present

## 2017-03-23 DIAGNOSIS — I252 Old myocardial infarction: Secondary | ICD-10-CM

## 2017-03-23 DIAGNOSIS — E11649 Type 2 diabetes mellitus with hypoglycemia without coma: Secondary | ICD-10-CM | POA: Diagnosis present

## 2017-03-23 DIAGNOSIS — Z87891 Personal history of nicotine dependence: Secondary | ICD-10-CM

## 2017-03-23 DIAGNOSIS — R0789 Other chest pain: Secondary | ICD-10-CM | POA: Diagnosis present

## 2017-03-23 DIAGNOSIS — N186 End stage renal disease: Secondary | ICD-10-CM | POA: Diagnosis present

## 2017-03-23 DIAGNOSIS — Z85528 Personal history of other malignant neoplasm of kidney: Secondary | ICD-10-CM

## 2017-03-23 DIAGNOSIS — E1143 Type 2 diabetes mellitus with diabetic autonomic (poly)neuropathy: Secondary | ICD-10-CM | POA: Diagnosis present

## 2017-03-23 DIAGNOSIS — J449 Chronic obstructive pulmonary disease, unspecified: Secondary | ICD-10-CM | POA: Diagnosis present

## 2017-03-23 DIAGNOSIS — I5033 Acute on chronic diastolic (congestive) heart failure: Secondary | ICD-10-CM | POA: Diagnosis present

## 2017-03-23 DIAGNOSIS — E1140 Type 2 diabetes mellitus with diabetic neuropathy, unspecified: Secondary | ICD-10-CM | POA: Diagnosis present

## 2017-03-23 DIAGNOSIS — K219 Gastro-esophageal reflux disease without esophagitis: Secondary | ICD-10-CM | POA: Diagnosis present

## 2017-03-23 DIAGNOSIS — Z888 Allergy status to other drugs, medicaments and biological substances status: Secondary | ICD-10-CM | POA: Diagnosis not present

## 2017-03-23 DIAGNOSIS — E11319 Type 2 diabetes mellitus with unspecified diabetic retinopathy without macular edema: Secondary | ICD-10-CM | POA: Diagnosis present

## 2017-03-23 DIAGNOSIS — D631 Anemia in chronic kidney disease: Secondary | ICD-10-CM | POA: Diagnosis present

## 2017-03-23 DIAGNOSIS — Z90710 Acquired absence of both cervix and uterus: Secondary | ICD-10-CM

## 2017-03-23 DIAGNOSIS — Z885 Allergy status to narcotic agent status: Secondary | ICD-10-CM | POA: Diagnosis not present

## 2017-03-23 DIAGNOSIS — I340 Nonrheumatic mitral (valve) insufficiency: Secondary | ICD-10-CM | POA: Diagnosis present

## 2017-03-23 LAB — BASIC METABOLIC PANEL
Anion gap: 11 (ref 5–15)
BUN: 35 mg/dL — ABNORMAL HIGH (ref 6–20)
CO2: 29 mmol/L (ref 22–32)
Calcium: 8.1 mg/dL — ABNORMAL LOW (ref 8.9–10.3)
Chloride: 92 mmol/L — ABNORMAL LOW (ref 101–111)
Creatinine, Ser: 7.45 mg/dL — ABNORMAL HIGH (ref 0.44–1.00)
GFR calc Af Amer: 6 mL/min — ABNORMAL LOW (ref >60)
GFR calc non Af Amer: 5 mL/min — ABNORMAL LOW (ref >60)
Glucose, Bld: 252 mg/dL — ABNORMAL HIGH (ref 65–99)
Potassium: 4.8 mmol/L (ref 3.5–5.1)
Sodium: 132 mmol/L — ABNORMAL LOW (ref 135–145)

## 2017-03-23 LAB — GLUCOSE, CAPILLARY
Glucose-Capillary: 183 mg/dL — ABNORMAL HIGH (ref 65–99)
Glucose-Capillary: 53 mg/dL — ABNORMAL LOW (ref 65–99)
Glucose-Capillary: 90 mg/dL (ref 65–99)

## 2017-03-23 LAB — CBC
HCT: 32.5 % — ABNORMAL LOW (ref 35.0–47.0)
Hemoglobin: 11 g/dL — ABNORMAL LOW (ref 12.0–16.0)
MCH: 32.3 pg (ref 26.0–34.0)
MCHC: 33.8 g/dL (ref 32.0–36.0)
MCV: 95.4 fL (ref 80.0–100.0)
Platelets: 242 10*3/uL (ref 150–440)
RBC: 3.41 MIL/uL — ABNORMAL LOW (ref 3.80–5.20)
RDW: 18 % — ABNORMAL HIGH (ref 11.5–14.5)
WBC: 7.5 10*3/uL (ref 3.6–11.0)

## 2017-03-23 LAB — TROPONIN I
Troponin I: <0.03 ng/mL (ref <0.03)
Troponin I: <0.03 ng/mL (ref <0.03)
Troponin I: <0.03 ng/mL (ref <0.03)

## 2017-03-23 LAB — MRSA PCR SCREENING: MRSA by PCR: NEGATIVE

## 2017-03-23 MED ORDER — NITROGLYCERIN 0.4 MG SL SUBL
0.4000 mg | SUBLINGUAL_TABLET | Freq: Once | SUBLINGUAL | Status: AC
  Administered 2017-03-23: 0.4 mg via SUBLINGUAL
  Filled 2017-03-23: qty 1

## 2017-03-23 MED ORDER — AMLODIPINE BESYLATE 10 MG PO TABS
10.0000 mg | ORAL_TABLET | Freq: Every day | ORAL | Status: DC
  Administered 2017-03-24 – 2017-03-25 (×2): 10 mg via ORAL
  Filled 2017-03-23 (×2): qty 1

## 2017-03-23 MED ORDER — HYDRALAZINE HCL 20 MG/ML IJ SOLN
10.0000 mg | Freq: Four times a day (QID) | INTRAMUSCULAR | Status: DC | PRN
  Administered 2017-03-23: 10 mg via INTRAVENOUS
  Filled 2017-03-23: qty 1

## 2017-03-23 MED ORDER — HEPARIN SODIUM (PORCINE) 5000 UNIT/ML IJ SOLN
5000.0000 [IU] | Freq: Three times a day (TID) | INTRAMUSCULAR | Status: DC
  Administered 2017-03-23 – 2017-03-25 (×5): 5000 [IU] via SUBCUTANEOUS
  Filled 2017-03-23 (×5): qty 1

## 2017-03-23 MED ORDER — PANTOPRAZOLE SODIUM 40 MG PO TBEC
40.0000 mg | DELAYED_RELEASE_TABLET | Freq: Two times a day (BID) | ORAL | Status: DC
  Administered 2017-03-23 – 2017-03-25 (×4): 40 mg via ORAL
  Filled 2017-03-23 (×4): qty 1

## 2017-03-23 MED ORDER — ACETAMINOPHEN 325 MG PO TABS: 650.0000 mg | ORAL_TABLET | Freq: Four times a day (QID) | ORAL | Status: DC | PRN

## 2017-03-23 MED ORDER — HYDROCODONE-ACETAMINOPHEN 5-325 MG PO TABS
1.0000 | ORAL_TABLET | ORAL | Status: DC | PRN
  Administered 2017-03-23 – 2017-03-25 (×5): 2 via ORAL
  Filled 2017-03-23 (×5): qty 2

## 2017-03-23 MED ORDER — PREGABALIN 25 MG PO CAPS
25.0000 mg | ORAL_CAPSULE | Freq: Every day | ORAL | Status: DC
  Administered 2017-03-24 – 2017-03-25 (×2): 25 mg via ORAL
  Filled 2017-03-23 (×2): qty 1

## 2017-03-23 MED ORDER — VITAMIN B-12 1000 MCG PO TABS
1000.0000 ug | ORAL_TABLET | Freq: Every day | ORAL | Status: DC
  Administered 2017-03-25: 1000 ug via ORAL
  Filled 2017-03-23: qty 1

## 2017-03-23 MED ORDER — PANCRELIPASE (LIP-PROT-AMYL) 12000-38000 UNITS PO CPEP: 24000.0000 [IU] | ORAL_CAPSULE | Freq: Three times a day (TID) | ORAL | Status: DC

## 2017-03-23 MED ORDER — PANCRELIPASE (LIP-PROT-AMYL) 12000-38000 UNITS PO CPEP
36000.0000 [IU] | ORAL_CAPSULE | Freq: Two times a day (BID) | ORAL | Status: DC
  Administered 2017-03-23 – 2017-03-24 (×3): 36000 [IU] via ORAL
  Filled 2017-03-23 (×3): qty 3

## 2017-03-23 MED ORDER — ACETAMINOPHEN 650 MG RE SUPP: 650.0000 mg | Freq: Four times a day (QID) | RECTAL | Status: DC | PRN

## 2017-03-23 MED ORDER — FAMOTIDINE 20 MG PO TABS
20.0000 mg | ORAL_TABLET | Freq: Every day | ORAL | Status: DC
  Administered 2017-03-24: 20 mg via ORAL
  Filled 2017-03-23: qty 1

## 2017-03-23 MED ORDER — ATORVASTATIN CALCIUM 20 MG PO TABS
20.0000 mg | ORAL_TABLET | Freq: Every day | ORAL | Status: DC
  Administered 2017-03-23 – 2017-03-24 (×2): 20 mg via ORAL
  Filled 2017-03-23 (×2): qty 1

## 2017-03-23 MED ORDER — PROMETHAZINE HCL 25 MG/ML IJ SOLN
12.5000 mg | Freq: Four times a day (QID) | INTRAMUSCULAR | Status: DC | PRN
  Administered 2017-03-23 – 2017-03-24 (×4): 12.5 mg via INTRAVENOUS
  Filled 2017-03-23 (×4): qty 1

## 2017-03-23 MED ORDER — RIFAXIMIN 550 MG PO TABS
550.0000 mg | ORAL_TABLET | Freq: Two times a day (BID) | ORAL | Status: DC
  Administered 2017-03-23 – 2017-03-25 (×4): 550 mg via ORAL
  Filled 2017-03-23 (×4): qty 1

## 2017-03-23 MED ORDER — NITROGLYCERIN 2 % TD OINT
0.5000 [in_us] | TOPICAL_OINTMENT | Freq: Four times a day (QID) | TRANSDERMAL | Status: DC
  Administered 2017-03-23 – 2017-03-24 (×2): 0.5 [in_us] via TOPICAL
  Filled 2017-03-23 (×3): qty 1

## 2017-03-23 MED ORDER — HYDRALAZINE HCL 25 MG PO TABS
25.0000 mg | ORAL_TABLET | Freq: Three times a day (TID) | ORAL | Status: DC
  Administered 2017-03-23 – 2017-03-25 (×6): 25 mg via ORAL
  Filled 2017-03-23 (×6): qty 1

## 2017-03-23 MED ORDER — DIPHENHYDRAMINE HCL 25 MG PO CAPS
25.0000 mg | ORAL_CAPSULE | Freq: Four times a day (QID) | ORAL | Status: DC | PRN
  Administered 2017-03-23 – 2017-03-24 (×2): 25 mg via ORAL
  Filled 2017-03-23 (×2): qty 1

## 2017-03-23 MED ORDER — LORATADINE 10 MG PO TABS
10.0000 mg | ORAL_TABLET | Freq: Every day | ORAL | Status: DC
  Administered 2017-03-24 – 2017-03-25 (×2): 10 mg via ORAL
  Filled 2017-03-23 (×2): qty 1

## 2017-03-23 MED ORDER — RENA-VITE PO TABS
1.0000 | ORAL_TABLET | Freq: Every day | ORAL | Status: DC
  Administered 2017-03-24 – 2017-03-25 (×2): 1 via ORAL
  Filled 2017-03-23 (×2): qty 1

## 2017-03-23 MED ORDER — INSULIN ASPART 100 UNIT/ML ~~LOC~~ SOLN
0.0000 [IU] | Freq: Every day | SUBCUTANEOUS | Status: DC
  Administered 2017-03-24: 2 [IU] via SUBCUTANEOUS
  Filled 2017-03-23: qty 1

## 2017-03-23 MED ORDER — PANCRELIPASE (LIP-PROT-AMYL) 12000-38000 UNITS PO CPEP
24000.0000 [IU] | ORAL_CAPSULE | Freq: Every day | ORAL | Status: DC
  Administered 2017-03-24 – 2017-03-25 (×2): 24000 [IU] via ORAL
  Filled 2017-03-23 (×2): qty 2

## 2017-03-23 MED ORDER — HYDROCODONE-ACETAMINOPHEN 5-325 MG PO TABS
2.0000 | ORAL_TABLET | Freq: Once | ORAL | Status: AC
  Administered 2017-03-23: 2 via ORAL
  Filled 2017-03-23: qty 2

## 2017-03-23 MED ORDER — ASPIRIN EC 81 MG PO TBEC
81.0000 mg | DELAYED_RELEASE_TABLET | Freq: Every day | ORAL | Status: DC
  Administered 2017-03-24 – 2017-03-25 (×2): 81 mg via ORAL
  Filled 2017-03-23 (×2): qty 1

## 2017-03-23 MED ORDER — OMEGA-3-ACID ETHYL ESTERS 1 G PO CAPS
1.0000 g | ORAL_CAPSULE | Freq: Two times a day (BID) | ORAL | Status: DC
  Administered 2017-03-23 – 2017-03-25 (×4): 1 g via ORAL
  Filled 2017-03-23 (×4): qty 1

## 2017-03-23 MED ORDER — FLUOXETINE HCL 20 MG PO CAPS
20.0000 mg | ORAL_CAPSULE | Freq: Every day | ORAL | Status: DC
  Administered 2017-03-23 – 2017-03-24 (×2): 20 mg via ORAL
  Filled 2017-03-23 (×3): qty 1

## 2017-03-23 MED ORDER — INSULIN ASPART 100 UNIT/ML ~~LOC~~ SOLN
0.0000 [IU] | Freq: Three times a day (TID) | SUBCUTANEOUS | Status: DC
  Administered 2017-03-23 – 2017-03-24 (×2): 2 [IU] via SUBCUTANEOUS
  Administered 2017-03-24: 1 [IU] via SUBCUTANEOUS
  Administered 2017-03-25 (×2): 2 [IU] via SUBCUTANEOUS
  Filled 2017-03-23 (×5): qty 1

## 2017-03-23 MED ORDER — RISAQUAD PO CAPS
1.0000 | ORAL_CAPSULE | Freq: Every day | ORAL | Status: DC
  Administered 2017-03-25: 1 via ORAL
  Filled 2017-03-23: qty 1

## 2017-03-23 MED ORDER — ALBUTEROL SULFATE (2.5 MG/3ML) 0.083% IN NEBU: 2.5000 mg | INHALATION_SOLUTION | Freq: Four times a day (QID) | RESPIRATORY_TRACT | Status: DC | PRN

## 2017-03-23 MED ORDER — SEVELAMER CARBONATE 800 MG PO TABS
800.0000 mg | ORAL_TABLET | Freq: Three times a day (TID) | ORAL | Status: DC
  Administered 2017-03-23 – 2017-03-25 (×4): 800 mg via ORAL
  Filled 2017-03-23 (×5): qty 1

## 2017-03-23 MED ORDER — OLANZAPINE 5 MG PO TABS
5.0000 mg | ORAL_TABLET | Freq: Every day | ORAL | Status: DC
  Administered 2017-03-23 – 2017-03-24 (×2): 5 mg via ORAL
  Filled 2017-03-23 (×3): qty 1

## 2017-03-23 NOTE — Progress Notes (Signed)
Pre hd assessment  

## 2017-03-23 NOTE — ED Triage Notes (Signed)
Pt in via EMS from [name] with complaints of left sided chest pain beginning this morning.  Pt tender upon palpation.  Pt reports nausea as only associated symptom.  NAD noted at this time.

## 2017-03-23 NOTE — Progress Notes (Signed)
[facility]  ROUNDING NOTE   Subjective:   [name] admitted to [facility] on 03/23/2017 for Chest pain, unspecified type [R07.9]  Last hemodialysis was Friday. She was at her eye doctor's appointment when she developed nausea, vomiting, abdominal pain and chest pain. Sent to ED via EMS. Missed her afternoon regularly scheduled dialysis.  Patient states she has been feeling bad all weekend and was unable to make her part time job on Saturday.   Objective:  Vital signs in last 24 hours:  Temp:  [97.5 F (36.4 C)-98.4 F (36.9 C)] 97.5 F (36.4 C) (07/02 2015) Pulse Rate:  [70-77] 70 (07/02 2015) Resp:  [12-20] 18 (07/02 2015) BP: (132-194)/(55-84) 132/70 (07/02 2015) SpO2:  [97 %-100 %] 99 % (07/02 2015) Weight:  [81.2 kg (179 lb)-82.1 kg (181 lb)] 82.1 kg (181 lb) (07/02 1700)  Weight change:  Filed Weights   03/23/17 0946 03/23/17 1552 03/23/17 1700  Weight: 81.2 kg (179 lb) 81.2 kg (179 lb) 82.1 kg (181 lb)    Intake/Output: No intake/output data recorded.   Intake/Output this shift:  No intake/output data recorded.  Physical Exam: General: NAD,   Head: Normocephalic, atraumatic. Moist oral mucosal membranes  Eyes: Anicteric, PERRL  Neck: Supple, trachea midline  Lungs:  Clear to auscultation  Heart: Regular rate and rhythm  Abdomen:  Soft, nontender,   Extremities: No peripheral edema. Left forearm in caste  Neurologic: Nonfocal, moving all four extremities  Skin: No lesions  Access: Right AVF    Basic Metabolic Panel:  Recent Labs Lab 03/20/17 1954 03/23/17 1056  NA 135 132*  K 3.7 4.8  CL 95* 92*  CO2 29 29  GLUCOSE 239* 252*  BUN 14 35*  CREATININE 3.35* 7.45*  CALCIUM 7.9* 8.1*    Liver Function Tests:  Recent Labs Lab 03/20/17 1954  AST 21  ALT 15  ALKPHOS 152*  BILITOT 0.8  PROT 7.7  ALBUMIN 3.6    Recent Labs Lab 03/20/17 1954  LIPASE 20   No results for input(s): AMMONIA in the last 168 hours.  CBC:  Recent  Labs Lab 03/20/17 1954 03/23/17 1056  WBC 5.6 7.5  HGB 10.4* 11.0*  HCT 31.0* 32.5*  MCV 92.8 95.4  PLT 223 242    Cardiac Enzymes:  Recent Labs Lab 03/23/17 1056 03/23/17 1549  TROPONINI <0.03 <0.03    BNP: Invalid input(s): POCBNP  CBG:  Recent Labs Lab 03/23/17 1635  GLUCAP 183*    Microbiology: Results for orders placed or performed during the hospital encounter of 03/23/17  MRSA PCR Screening     Status: None   Collection Time: 03/23/17  4:38 PM  Result Value Ref Range Status   MRSA by PCR NEGATIVE NEGATIVE Final    Comment:        The GeneXpert MRSA Assay (FDA approved for NASAL specimens only), is one component of a comprehensive MRSA colonization surveillance program. It is not intended to diagnose MRSA infection nor to guide or monitor treatment for MRSA infections.     Coagulation Studies: No results for input(s): LABPROT, INR in the last 72 hours.  Urinalysis: No results for input(s): COLORURINE, LABSPEC, PHURINE, GLUCOSEU, HGBUR, BILIRUBINUR, KETONESUR, PROTEINUR, UROBILINOGEN, NITRITE, LEUKOCYTESUR in the last 72 hours.  Invalid input(s): APPERANCEUR    Imaging: Dg Chest 2 View  Result Date: 03/23/2017 CLINICAL DATA:  Chest pain and nausea EXAM: CHEST  2 VIEW COMPARISON:  December 05, 2016 FINDINGS: There is generalized interstitial prominence, likely interstitial  pulmonary edema. There is a small right pleural effusion. There is cardiomegaly with mild pulmonary venous hypertension. No adenopathy. There is a stent in the left subclavian and axillary regions, stable. IMPRESSION: Findings felt to most likely represent chronic congestive heart failure. It should be noted that underlying interstitial prominence from underlying interstitial disease including atypical pneumonia superimposed on apparent chronic congestive heart failure cannot be excluded radiographically. Appearance is similar to recent study. No airspace consolidation present.  Electronically Signed   By: Lowella Grip III M.D.   On: 03/23/2017 10:33     Medications:    . [START ON 03/24/2017] acidophilus  1 capsule Oral Q lunch  . [START ON 03/24/2017] amLODipine  10 mg Oral Daily  . [START ON 03/24/2017] aspirin EC  81 mg Oral Daily  . atorvastatin  20 mg Oral QHS  . [START ON 03/24/2017] famotidine  20 mg Oral Daily  . FLUoxetine  20 mg Oral QHS  . heparin  5,000 Units Subcutaneous Q8H  . hydrALAZINE  25 mg Oral TID  . insulin aspart  0-5 Units Subcutaneous QHS  . insulin aspart  0-9 Units Subcutaneous TID WC  . [START ON 03/24/2017] lipase/protease/amylase  24,000 Units Oral Q1200  . lipase/protease/amylase  36,000 Units Oral BID WC  . [START ON 03/24/2017] loratadine  10 mg Oral Daily  . [START ON 03/24/2017] multivitamin  1 tablet Oral Daily  . nitroGLYCERIN  0.5 inch Topical Q6H  . OLANZapine  5 mg Oral QHS  . omega-3 acid ethyl esters  1 g Oral BID  . pantoprazole  40 mg Oral BID AC  . [START ON 03/24/2017] pregabalin  25 mg Oral Daily  . rifaximin  550 mg Oral BID  . sevelamer carbonate  800 mg Oral TID WC  . [START ON 03/24/2017] cyanocobalamin  1,000 mcg Oral Q lunch   acetaminophen **OR** acetaminophen, albuterol, diphenhydrAMINE, hydrALAZINE, HYDROcodone-acetaminophen, promethazine  Assessment/ Plan:  [name] is a 56 y.o. black female with diabetes, hypertension, hyperlipidemia, diabetic retinopathy, diabetic neuropathy, diabetic gastroparesis, pancreatic insufficiency, depression, GERD. Renal masses  [facility] MWF [address]   1. End Stage Renal Disease: unable to get dialysis today. Complication with dialysis device. Will attempt hemodialysis again tomorrow and have vascular consulted   2. Hypertension: elevated when attempting dialysis treatment.  - Restart home blood pressure regimen  3. Anemia of chronic kidney disease: hemoglobin 11 - Mircera as outpatient.   4. Secondary Hyperparathyroidism: PTH 1098 last month. Outpatient calcium  and phosphorus at goal.  - Sevelamer with meals.    LOS: 0 [name] 7/2/20188:47 PM

## 2017-03-23 NOTE — Progress Notes (Signed)
Post hd 

## 2017-03-23 NOTE — ED Notes (Signed)
MD to bedside to attempt [place] PIV placement.

## 2017-03-23 NOTE — Progress Notes (Signed)
Pre hd info 

## 2017-03-23 NOTE — H&P (Signed)
[facility] NAME: [name]    MR#:  [id number]  DATE OF BIRTH:  July 03, 1961  DATE OF ADMISSION:  03/23/2017  PRIMARY CARE PHYSICIAN: Ellamae Sia, MD   REQUESTING/REFERRING PHYSICIAN: Dr. Lavonia Drafts  CHIEF COMPLAINT:   Chief Complaint  Patient presents with  . Chest Pain    HISTORY OF PRESENT ILLNESS:  [name]  is a 56 y.o. female with a known history of Anemia of chronic disease, end-stage renal disease on Monday Wednesday Friday hemodialysis, COPD not on home oxygen, CAD, diabetes, neuropathy, hypertension, arthritis and GERD presents to hospital secondary to nausea and chest pain. Patient has had several admissions for abdominal pain, chronic pancreatitis with nausea and vomiting. She has chronic dry heaving with nausea at baseline. Allergic to multiple anti-emetics. She says her nausea vomiting worsened on Friday and came to the emergency room, she was treated symptomatically and was discharged home. Today she went for her follow-up with ophthalmology and was sent to the emergency room and she was complaining of chest pain. Chest discomfort started this morning when she woke up. Describes it as an uneasy feeling starting in her epigastric region and spreading to her chest. Already on 2 different anti-reflux medications. Denies any belching or vomiting. Has been dry heaving and nauseous. Denies any further radiation of chest pain. Chest pain has improved with Vicodin here in the emergency room. EKG with no significant changes. First troponin is negative. Of note patient had a cardiac catheterization in November 2016 which showed nonsignificant CAD with 30-40% occlusions.  PAST MEDICAL HISTORY:   Past Medical History:  Diagnosis Date  . Anemia   . Anginal pain ([place])   . Anxiety   . Arthritis   . Asthma   . Broken wrist   . Bronchitis   . chronic diastolic CHF 12/11/252  . COPD (chronic obstructive pulmonary disease)  ([place])   . Coronary artery disease    a. cath 2013: stenting to RCA (report not available); b. cath 2014: LM nl, pLAD 40%, mLAD nl, ost LCx 40%, mid LCx nl, pRCA 30% @ site of prior stent, mRCA 50%  . Depression   . Diabetes mellitus without complication ([place])   . Diabetic neuropathy ([place])   . dialysis 2006  . Diverticulosis   . Dizziness   . Dyspnea   . Elevated lipids   . Environmental and seasonal allergies   . ESRD (end stage renal disease) on dialysis ([place])    M-W-F  . GERD (gastroesophageal reflux disease)   . Headache   . History of hiatal hernia   . HOH (hard of hearing)   . Hx of pancreatitis 2015  . Hypertension   . Lower extremity edema   . Mitral regurgitation    a. echo 10/2013: EF 62%, noWMA, mildly dilated LA, mild to mod MR/TR, GR1DD  . Myocardial infarction ([place])   . Orthopnea   . Pneumonia   . Renal cancer ([place])   . Renal insufficiency    Pt is on dialysis on M,W + F.  . Wheezing     PAST SURGICAL HISTORY:   Past Surgical History:  Procedure Laterality Date  . ABDOMINAL HYSTERECTOMY  1992  . APPENDECTOMY    . CARDIAC CATHETERIZATION Left 07/26/2015   Procedure: Left Heart Cath and Coronary Angiography;  Surgeon: Dionisio David, MD;  Location: [facility];  Service: Cardiovascular;  Laterality: Left;  . CATARACT EXTRACTION W/ INTRAOCULAR  LENS IMPLANT Right   . CATARACT EXTRACTION W/PHACO Left 03/10/2017   Procedure: CATARACT EXTRACTION PHACO AND INTRAOCULAR LENS PLACEMENT (IOC);  Surgeon: Birder Robson, MD;  Location: [facility];  Service: Ophthalmology;  Laterality: Left;  [place] 00:51.9 AP% 14.2 CDE 7.39 fluid pack lot # [id number]  . CHOLECYSTECTOMY    . COLONOSCOPY WITH PROPOFOL N/A 08/12/2016   Procedure: COLONOSCOPY WITH PROPOFOL;  Surgeon: Lollie Sails, MD;  Location: [facility] ENDOSCOPY;  Service: Endoscopy;  Laterality: N/A;  . DIALYSIS FISTULA CREATION Left    upper arm  . dialysis grafts    . ESOPHAGOGASTRODUODENOSCOPY N/A 03/08/2015    Procedure: ESOPHAGOGASTRODUODENOSCOPY (EGD);  Surgeon: Manya Silvas, MD;  Location: [facility] ENDOSCOPY;  Service: Endoscopy;  Laterality: N/A;  . ESOPHAGOGASTRODUODENOSCOPY (EGD) WITH PROPOFOL N/A 03/18/2016   Procedure: ESOPHAGOGASTRODUODENOSCOPY (EGD) WITH PROPOFOL;  Surgeon: [name], MD;  Location: [facility] ENDOSCOPY;  Service: Endoscopy;  Laterality: N/A;  . EYE SURGERY Right 2018  . FECAL TRANSPLANT N/A 08/23/2015   Procedure: FECAL TRANSPLANT;  Surgeon: Manya Silvas, MD;  Location: [facility] ENDOSCOPY;  Service: Endoscopy;  Laterality: N/A;  . HAND SURGERY Bilateral   . PERIPHERAL VASCULAR CATHETERIZATION N/A 12/20/2015   Procedure: Thrombectomy of dialysis access versus permcath placement;  Surgeon: Algernon Huxley, MD;  Location: [facility];  Service: Cardiovascular;  Laterality: N/A;  . PERIPHERAL VASCULAR CATHETERIZATION N/A 12/20/2015   Procedure: A/V Shunt Intervention;  Surgeon: Algernon Huxley, MD;  Location: [facility];  Service: Cardiovascular;  Laterality: N/A;  . PERIPHERAL VASCULAR CATHETERIZATION N/A 12/20/2015   Procedure: A/V Shuntogram/Fistulagram;  Surgeon: Algernon Huxley, MD;  Location: [facility];  Service: Cardiovascular;  Laterality: N/A;  . PERIPHERAL VASCULAR CATHETERIZATION N/A 01/02/2016   Procedure: A/V Shuntogram/Fistulagram;  Surgeon: Algernon Huxley, MD;  Location: [facility];  Service: Cardiovascular;  Laterality: N/A;  . PERIPHERAL VASCULAR CATHETERIZATION N/A 01/02/2016   Procedure: A/V Shunt Intervention;  Surgeon: Algernon Huxley, MD;  Location: [facility];  Service: Cardiovascular;  Laterality: N/A;    SOCIAL HISTORY:   Social History  Substance Use Topics  . Smoking status: Former Smoker    Packs/day: 0.50    Types: Cigarettes    Quit date: 02/13/2015  . Smokeless tobacco: Never Used  . Alcohol use 0.6 oz/week    1 Glasses of wine per week     Comment: glass wine week per pt    FAMILY HISTORY:   Family History    Problem Relation Age of Onset  . Kidney disease Mother   . Diabetes Mother   . Cancer Father   . Kidney disease Sister     DRUG ALLERGIES:   Allergies  Allergen Reactions  . Ace Inhibitors Swelling and Anaphylaxis  . Ativan [Lorazepam] Other (See Comments)    Reaction:Hallucinations and headaches Reaction:Hallucinations and headaches Reaction:  Hallucinations and headaches  . Compazine [Prochlorperazine Edisylate] Anaphylaxis and Nausea And Vomiting    Other reaction(s): dystonia from this vs. reglan 23 Jul - patient relates that she takes promethazine frequently with no problems. 23 Jul - patient relates that she takes promethazine frequently with no problems.  . Sumatriptan Succinate     Other reaction(s): delirium and hallucinations per [facility] records  . Dilaudid [Hydromorphone Hcl] Other (See Comments)    Delirium   . Ondansetron Other (See Comments)    hallucinations    . Zofran [Ondansetron Hcl] Other (See Comments)    Reaction:  hallucinations  Reaction:  hallucinations   . Codeine Nausea And Vomiting  . Gabapentin Other (See Comments)    Reaction:  Unknown    . [place] Nausea And Vomiting  . Losartan Nausea Only  . Oxycodone Anxiety  . Prochlorperazine Other (See Comments)    Reaction:  Unknown . Patient does not remember reaction but she does have vertigo and anxiety along with n and v at times. Could be used to treat any of these   . Reglan [Metoclopramide]     Per patient her Dr. Evelina Bucy her off it  Per patient her Dr. Evelina Bucy her off it   . Scopolamine Other (See Comments)    Dizziness, also has vertigo already  . Tape Rash  . Tapentadol Rash    REVIEW OF SYSTEMS:   Review of Systems  Constitutional: Positive for malaise/fatigue. Negative for chills, fever and weight loss.  HENT: Negative for ear discharge, ear pain, hearing loss and nosebleeds.   Eyes: Negative for blurred vision, double vision and photophobia.  Respiratory: Positive for shortness  of breath. Negative for cough, hemoptysis and wheezing.   Cardiovascular: Positive for chest pain. Negative for palpitations, orthopnea and leg swelling.  Gastrointestinal: Positive for abdominal pain and nausea. Negative for constipation, diarrhea, heartburn, melena and vomiting.  Genitourinary: Negative for dysuria, frequency, hematuria and urgency.  Musculoskeletal: Positive for myalgias. Negative for back pain and neck pain.  Skin: Negative for rash.  Neurological: Negative for dizziness, tingling, tremors, sensory change, speech change, focal weakness and headaches.  Endo/Heme/Allergies: Does not bruise/bleed easily.  Psychiatric/Behavioral: Negative for depression.    MEDICATIONS AT HOME:   Prior to Admission medications   Medication Sig Start Date End Date Taking? Authorizing Provider  amLODipine (NORVASC) 10 MG tablet Take 10 mg by mouth daily.   Yes [provider]  aspirin EC 81 MG tablet Take 81 mg by mouth daily. 09/08/16  Yes [provider]  atorvastatin (LIPITOR) 40 MG tablet Take 20 mg by mouth at bedtime.  12/02/16  Yes [provider]  cetirizine (ZYRTEC) 10 MG tablet Take 10 mg by mouth daily.    Yes [provider]  cyanocobalamin 1000 MCG tablet Take 1,000 mcg by mouth daily with lunch.    Yes [provider]  FLUoxetine (PROZAC) 20 MG capsule Take 1 capsule (20 mg total) by mouth daily. Patient taking differently: Take 20 mg by mouth at bedtime.  04/10/16  Yes Hower, Aaron Mose, MD  hydrALAZINE (APRESOLINE) 25 MG tablet Take 25 mg by mouth 3 (three) times daily.   Yes [provider]  LEVEMIR FLEXTOUCH 100 UNIT/ML Pen Inject 4 Units into the skin 2 (two) times daily.  12/03/16  Yes [provider]  lipase/protease/amylase (CREON) 12000 UNITS CPEP capsule Take 24,000-36,000 Units by mouth 3 (three) times daily with meals. Take 36000 mg in the morning, 24000 midday & 36000 in the evening   Yes [provider]  meclizine (ANTIVERT) 25 MG tablet 25 mg   Yes [provider]  multivitamin (RENA-VIT) TABS tablet Take 1 tablet by mouth daily.    Yes [provider]  OLANZapine (ZYPREXA) 5 MG tablet Take 0.5 tablets (2.5 mg total) by mouth at bedtime. Patient taking differently: Take 5 mg by mouth at bedtime.  04/29/16  Yes Wieting, Richard, MD  omega-3 acid ethyl esters (LOVAZA) 1 g capsule Take 1 g by mouth 2 (two) times daily.    Yes [provider]  pantoprazole (PROTONIX) 40 MG tablet Take 1  tablet (40 mg total) by mouth 2 (two) times daily before a meal. 03/19/16  Yes Gladstone Lighter, MD  pregabalin (LYRICA) 25 MG capsule Take 25 mg by mouth daily.   Yes [provider]  ranitidine (ZANTAC) 150 MG tablet Take 150 mg by mouth 2 (two) times daily. In the afternoon   Yes [provider]  rifaximin (XIFAXAN) 550 MG TABS tablet Take 550 mg by mouth 2 (two) times daily.   Yes [provider]  sevelamer carbonate (RENVELA) 800 MG tablet Take 800 mg by mouth 3 (three) times daily with meals.    Yes [provider]  acidophilus (RISAQUAD) CAPS capsule Take 1 capsule by mouth daily with lunch.     [provider]  albuterol (PROVENTIL HFA;VENTOLIN HFA) 108 (90 Base) MCG/ACT inhaler Inhale 2 puffs into the lungs every 6 (six) hours as needed for shortness of breath.     [provider]  albuterol (PROVENTIL) (2.5 MG/3ML) 0.083% nebulizer solution Take 2.5 mg by nebulization every 6 (six) hours as needed for wheezing or shortness of breath.    [provider]  diclofenac sodium (VOLTAREN) 1 % GEL Apply 2 g topically 2 (two) times daily as needed (for leg pain).     [provider]  diphenhydrAMINE (BENADRYL) 25 mg capsule Take 25 mg by mouth every 6 (six) hours as needed for itching.    [provider]  glucose 4 GM chewable tablet Chew 3-4 tablets by mouth as needed for low blood sugar (blood sugar less than  60).     [provider]  HYDROcodone-acetaminophen (NORCO/VICODIN) 5-325 MG tablet Take 1 tablet by mouth every 6 (six) hours as needed for moderate pain or severe pain. Patient taking differently: Take 1 tablet by mouth 3 (three) times daily as needed (for severe pain.).  04/29/16   Loletha Grayer, MD  naloxone [facility]) 0.4 MG/ML injection Inject 0.4 mg into the vein daily as needed (for opoid overdose).    [provider]  nitroGLYCERIN (NITROSTAT) 0.4 MG SL tablet Place 0.4 mg under the tongue every 5 (five) minutes x 3 doses as needed.    [provider]  NOVOLOG FLEXPEN 100 UNIT/ML FlexPen Inject 4-12 Units into the skin 3 (three) times daily before meals. Per sliding scale 12/03/16   [provider]  promethazine (PHENERGAN) 25 MG tablet Take 12.5 mg by mouth every 6 (six) hours as needed for nausea or vomiting.    [provider]      VITAL SIGNS:  Blood pressure (!) 192/84, pulse 76, temperature 98.4 F (36.9 C), resp. rate 15, height 5\' 5"  (1.651 m), weight 81.2 kg (179 lb), SpO2 97 %.  PHYSICAL EXAMINATION:   Physical Exam  GENERAL:  56 y.o.-year-old patient lying in the bed with no acute distress.  EYES: Pupils equal, round, reactive to light and accommodation. No scleral icterus. Extraocular muscles intact.  HEENT: Head atraumatic, normocephalic. Oropharynx and nasopharynx clear.  NECK:  Supple, no jugular venous distention. No thyroid enlargement, no tenderness.  LUNGS: Normal breath sounds bilaterally, no wheezing, rhonchi or crepitation. No use of accessory muscles of respiration. Fine bibasilar crackles present CARDIOVASCULAR: S1, S2 normal. No murmurs, rubs, or gallops.  ABDOMEN: Soft, Complains of tenderness diffusely all over, no guarding or rigidity,, nondistended. Bowel sounds present. No organomegaly or mass.  EXTREMITIES: No pedal edema, cyanosis, or clubbing. Left wrist in a cast NEUROLOGIC: Cranial nerves II through XII  are intact. Muscle strength 5/5 in all  extremities. Sensation intact. Gait not checked.  PSYCHIATRIC: The patient is alert and oriented x 3.  SKIN: No obvious rash, lesion, or ulcer.   LABORATORY PANEL:   CBC  Recent Labs Lab 03/23/17 1056  WBC 7.5  HGB 11.0*  HCT 32.5*  PLT 242   ------------------------------------------------------------------------------------------------------------------  Chemistries   Recent Labs Lab 03/20/17 1954 03/23/17 1056  NA 135 132*  K 3.7 4.8  CL 95* 92*  CO2 29 29  GLUCOSE 239* 252*  BUN 14 35*  CREATININE 3.35* 7.45*  CALCIUM 7.9* 8.1*  AST 21  --   ALT 15  --   ALKPHOS 152*  --   BILITOT 0.8  --    ------------------------------------------------------------------------------------------------------------------  Cardiac Enzymes  Recent Labs Lab 03/23/17 1056  TROPONINI <0.03   ------------------------------------------------------------------------------------------------------------------  RADIOLOGY:  Dg Chest 2 View  Result Date: 03/23/2017 CLINICAL DATA:  Chest pain and nausea EXAM: CHEST  2 VIEW COMPARISON:  December 05, 2016 FINDINGS: There is generalized interstitial prominence, likely interstitial pulmonary edema. There is a small right pleural effusion. There is cardiomegaly with mild pulmonary venous hypertension. No adenopathy. There is a stent in the left subclavian and axillary regions, stable. IMPRESSION: Findings felt to most likely represent chronic congestive heart failure. It should be noted that underlying interstitial prominence from underlying interstitial disease including atypical pneumonia superimposed on apparent chronic congestive heart failure cannot be excluded radiographically. Appearance is similar to recent study. No airspace consolidation present. Electronically Signed   By: Lowella Grip III M.D.   On: 03/23/2017 10:33    EKG:   Orders placed or performed during the hospital encounter of  03/23/17  . EKG 12-Lead  . EKG 12-Lead  . ED EKG within 10 minutes  . ED EKG within 10 minutes    IMPRESSION AND PLAN:   [name]  is a 56 y.o. female with a known history of Anemia of chronic disease, end-stage renal disease on Monday Wednesday Friday hemodialysis, COPD not on home oxygen, CAD, diabetes, neuropathy, hypertension, arthritis and GERD presents to hospital secondary to nausea and chest pain.  #1 chest pain-atypical chest pain. Started on Nitropaste. -Monitor on telemetry. Recycle troponins. If negative, no further inpatient workup needed if chest pain is resolved. -Could also be from pulmonary vascular congestion as noted on chest x-ray. Monitor after dialysis.  #2 end-stage renal dialysis-with acute pulmonary edema. Due for dialysis today. -Not hypoxic at this time. Nephrology consulted -Continue renal supplements.  #3 hypertension-continue home medications. Also on Nitropaste.  #4 diabetes mellitus-also has hypoglycemic episodes often. Takes her Levemir as needed. Here in the hospital will place only on sliding scale insulin.  #5 DVT prophylaxis-on subcutaneous heparin     All the records are reviewed and case discussed with ED provider. Management plans discussed with the patient, family and they are in agreement.  CODE STATUS: Full code  TOTAL TIME TAKING CARE OF THIS PATIENT: 50 minutes.    Gladstone Lighter M.D on 03/23/2017 at 2:32 PM  Between 7am to 6pm - Pager - [phone]  After 6pm go to [email or web address] - password [email or web address]  Office  [phone]  CC: Primary care physician; Ellamae Sia, MD

## 2017-03-23 NOTE — Progress Notes (Signed)
Pt unable to have HD tx d/t arterial clotted. Venous patent. Cannulated x2. Pulled clots both attempts. Pt stated she "was supposed to have AVF evaluated a while ago but didn't have time." Kolluru MD aware. Pt alert, stable, c/o of headache. Returned to room. Report to primary RN S Imhoff.

## 2017-03-23 NOTE — ED Provider Notes (Signed)
[facility] Emergency Department Provider Note   ____________________________________________    I have reviewed the triage vital signs and the nursing notes.   HISTORY  Chief Complaint Chest Pain     HPI [name] is a 56 y.o. female with a history of coronary artery disease, diabetes, end-stage renal disease who presents with complaints of chest pain. Patient reports she was feeling well yesterday, however this morning when she went to her eye appointment she started developed central chest pain. She describes it as an aching sensation that has now become mild to moderate although it was more severe earlier today. She reports mild shortness of breath. No fevers or chills. She reports over the last 2 days she has felt ill and initially had some vomiting 2 days ago. She is due for dialysis today.   Past Medical History:  Diagnosis Date  . Anemia   . Anginal pain ([place])   . Anxiety   . Arthritis   . Asthma   . Broken wrist   . Bronchitis   . chronic diastolic CHF 2/50/5397  . COPD (chronic obstructive pulmonary disease) ([place])   . Coronary artery disease    a. cath 2013: stenting to RCA (report not available); b. cath 2014: LM nl, pLAD 40%, mLAD nl, ost LCx 40%, mid LCx nl, pRCA 30% @ site of prior stent, mRCA 50%  . Depression   . Diabetes mellitus without complication ([place])   . Diabetic neuropathy ([place])   . dialysis 2006  . Diverticulosis   . Dizziness   . Dyspnea   . Elevated lipids   . Environmental and seasonal allergies   . ESRD (end stage renal disease) on dialysis ([place])    M-W-F  . GERD (gastroesophageal reflux disease)   . Headache   . History of hiatal hernia   . HOH (hard of hearing)   . Hx of pancreatitis 2015  . Hypertension   . Lower extremity edema   . Mitral regurgitation    a. echo 10/2013: EF 62%, noWMA, mildly dilated LA, mild to mod MR/TR, GR1DD  . Myocardial infarction ([place])   . Orthopnea   . Pneumonia   . Renal  cancer ([place])   . Renal insufficiency    Pt is on dialysis on M,W + F.  . Wheezing     Patient Active Problem List   Diagnosis Date Noted  . Cellulitis of lower extremity 07/29/2016  . Chronic venous insufficiency 07/29/2016  . Lymphedema 07/29/2016  . TIA (transient ischemic attack) 04/21/2016  . Altered mental status 04/08/2016  . Hyperammonemia ([place]) 04/08/2016  . Elevated troponin 04/08/2016  . ESRD (end stage renal disease) ([place]) 04/08/2016  . Depression 04/08/2016  . Depression, major, recurrent, severe with psychosis ([place]) 04/08/2016  . Blood in stool   . Intractable cyclical vomiting with nausea   . Reflux esophagitis   . Gastritis   . Generalized abdominal pain   . Uncontrollable vomiting   . Major depressive disorder, recurrent episode, moderate ([place]) 03/15/2016  . Adjustment disorder with mixed anxiety and depressed mood 03/15/2016  . Malnutrition of moderate degree 12/01/2015  . Renal mass   . Dyspnea   . Acute renal failure ([place])   . Respiratory failure ([place])   . High temperature 11/14/2015  . Pulmonary edema   . Encounter for central line placement   . Encounter for orogastric (OG) tube placement   . Nausea 11/12/2015  . Hyperkalemia 10/03/2015  . Diarrhea, unspecified  07/22/2015  . Pneumonia 05/21/2015  . Hypoglycemia 04/24/2015  . Unresponsiveness 04/24/2015  . Bradycardia 04/24/2015  . Hypothermia 04/24/2015  . Acute respiratory failure ([place]) 04/24/2015  . Acute diastolic CHF (congestive heart failure) ([place]) 04/05/2015  . Diabetic gastroparesis ([place]) 04/05/2015  . Hypokalemia 04/05/2015  . Generalized weakness 04/05/2015  . Acute pulmonary edema ([place]) 04/03/2015  . Nausea and vomiting 04/03/2015  . Hypoglycemia associated with diabetes ([place]) 04/03/2015  . Anemia of chronic disease 04/03/2015  . Secondary hyperparathyroidism ([place]) 04/03/2015  . Pressure ulcer 04/02/2015  . Acute respiratory failure with hypoxia ([place]) 04/01/2015  . Adjustment  disorder with anxiety 03/14/2015  . Somatic symptom disorder, mild 03/08/2015  . Coronary artery disease involving native coronary artery of native heart without angina pectoris   . Nausea & vomiting 03/06/2015  . Abdominal pain 03/06/2015  . DM (diabetes mellitus) ([place]) 03/06/2015  . HTN (hypertension) 03/06/2015  . Gastroparesis 02/24/2015  . Pleural effusion 02/19/2015  . HCAP (healthcare-associated pneumonia) 02/19/2015  . End-stage renal disease on hemodialysis ([place]) 02/19/2015    Past Surgical History:  Procedure Laterality Date  . ABDOMINAL HYSTERECTOMY  1992  . APPENDECTOMY    . CARDIAC CATHETERIZATION Left 07/26/2015   Procedure: Left Heart Cath and Coronary Angiography;  Surgeon: Dionisio David, MD;  Location: [place] [facility];  Service: Cardiovascular;  Laterality: Left;  . CATARACT EXTRACTION W/ INTRAOCULAR LENS IMPLANT Right   . CATARACT EXTRACTION W/PHACO Left 03/10/2017   Procedure: CATARACT EXTRACTION PHACO AND INTRAOCULAR LENS PLACEMENT (IOC);  Surgeon: Birder Robson, MD;  Location: [facility] ORS;  Service: Ophthalmology;  Laterality: Left;  [place] 00:51.9 AP% 14.2 CDE 7.39 fluid pack lot # [id number]  . CHOLECYSTECTOMY    . COLONOSCOPY WITH PROPOFOL N/A 08/12/2016   Procedure: COLONOSCOPY WITH PROPOFOL;  Surgeon: Lollie Sails, MD;  Location: [facility] ENDOSCOPY;  Service: Endoscopy;  Laterality: N/A;  . DIALYSIS FISTULA CREATION Left    upper arm  . dialysis grafts    . ESOPHAGOGASTRODUODENOSCOPY N/A 03/08/2015   Procedure: ESOPHAGOGASTRODUODENOSCOPY (EGD);  Surgeon: Manya Silvas, MD;  Location: [facility] ENDOSCOPY;  Service: Endoscopy;  Laterality: N/A;  . ESOPHAGOGASTRODUODENOSCOPY (EGD) WITH PROPOFOL N/A 03/18/2016   Procedure: ESOPHAGOGASTRODUODENOSCOPY (EGD) WITH PROPOFOL;  Surgeon: [name], MD;  Location: [facility] ENDOSCOPY;  Service: Endoscopy;  Laterality: N/A;  . EYE SURGERY Right 2018  . FECAL TRANSPLANT N/A 08/23/2015   Procedure: FECAL TRANSPLANT;  Surgeon:  Manya Silvas, MD;  Location[facility] ENDOSCOPY;  Service: Endoscopy;  Laterality: N/A;  . HAND SURGERY Bilateral   . PERIPHERAL VASCULAR CATHETERIZATION N/A 12/20/2015   Procedure: Thrombectomy of dialysis access versus permcath placement;  Surgeon: Algernon Huxley, MD;  Location: [place] [facility];  Service: Cardiovascular;  Laterality: N/A;  . PERIPHERAL VASCULAR CATHETERIZATION N/A 12/20/2015   Procedure: A/V Shunt Intervention;  Surgeon: Algernon Huxley, MD;  Location: [place] [facility];  Service: Cardiovascular;  Laterality: N/A;  . PERIPHERAL VASCULAR CATHETERIZATION N/A 12/20/2015   Procedure: A/V Shuntogram/Fistulagram;  Surgeon: Algernon Huxley, MD;  Location: [place] [facility];  Service: Cardiovascular;  Laterality: N/A;  . PERIPHERAL VASCULAR CATHETERIZATION N/A 01/02/2016   Procedure: A/V Shuntogram/Fistulagram;  Surgeon: Algernon Huxley, MD;  Location: [place] [facility] LAB;  Service: Cardiovascular;  Laterality: N/A;  . PERIPHERAL VASCULAR CATHETERIZATION N/A 01/02/2016   Procedure: A/V Shunt Intervention;  Surgeon: Algernon Huxley, MD;  Location: [place] [facility];  Service: Cardiovascular;  Laterality: N/A;    Prior to Admission medications  Medication Sig Start Date End Date Taking? Authorizing Provider  amLODipine (NORVASC) 10 MG tablet Take 10 mg by mouth daily.   Yes [provider]  aspirin EC 81 MG tablet Take 81 mg by mouth daily. 09/08/16  Yes [provider]  atorvastatin (LIPITOR) 40 MG tablet Take 20 mg by mouth at bedtime.  12/02/16  Yes [provider]  cetirizine (ZYRTEC) 10 MG tablet Take 10 mg by mouth daily.    Yes [provider]  cyanocobalamin 1000 MCG tablet Take 1,000 mcg by mouth daily with lunch.    Yes [provider]  FLUoxetine (PROZAC) 20 MG capsule Take 1 capsule (20 mg total) by mouth daily. Patient taking differently: Take 20 mg by mouth at bedtime.  04/10/16  Yes Hower, Aaron Mose, MD  hydrALAZINE (APRESOLINE) 25 MG  tablet Take 25 mg by mouth 3 (three) times daily.   Yes [provider]  LEVEMIR FLEXTOUCH 100 UNIT/ML Pen Inject 4 Units into the skin 2 (two) times daily.  12/03/16  Yes [provider]  lipase/protease/amylase (CREON) 12000 UNITS CPEP capsule Take 24,000-36,000 Units by mouth 3 (three) times daily with meals. Take 36000 mg in the morning, 24000 midday & 36000 in the evening   Yes [provider]  meclizine (ANTIVERT) 25 MG tablet 25 mg   Yes [provider]  multivitamin (RENA-VIT) TABS tablet Take 1 tablet by mouth daily.    Yes [provider]  OLANZapine (ZYPREXA) 5 MG tablet Take 0.5 tablets (2.5 mg total) by mouth at bedtime. Patient taking differently: Take 5 mg by mouth at bedtime.  04/29/16  Yes Wieting, Richard, MD  omega-3 acid ethyl esters (LOVAZA) 1 g capsule Take 1 g by mouth 2 (two) times daily.    Yes [provider]  pantoprazole (PROTONIX) 40 MG tablet Take 1 tablet (40 mg total) by mouth 2 (two) times daily before a meal. 03/19/16  Yes Gladstone Lighter, MD  pregabalin (LYRICA) 25 MG capsule Take 25 mg by mouth daily.   Yes [provider]  ranitidine (ZANTAC) 150 MG tablet Take 150 mg by mouth 2 (two) times daily. In the afternoon   Yes [provider]  rifaximin (XIFAXAN) 550 MG TABS tablet Take 550 mg by mouth 2 (two) times daily.   Yes [provider]  sevelamer carbonate (RENVELA) 800 MG tablet Take 800 mg by mouth 3 (three) times daily with meals.    Yes [provider]  acidophilus (RISAQUAD) CAPS capsule Take 1 capsule by mouth daily with lunch.     [provider]  albuterol (PROVENTIL HFA;VENTOLIN HFA) 108 (90 Base) MCG/ACT inhaler Inhale 2 puffs into the lungs every 6 (six) hours as needed for shortness of breath.     [provider]  albuterol (PROVENTIL) (2.5 MG/3ML) 0.083% nebulizer solution Take 2.5 mg by nebulization every 6 (six) hours as needed for wheezing or  shortness of breath.    [provider]  diclofenac sodium (VOLTAREN) 1 % GEL Apply 2 g topically 2 (two) times daily as needed (for leg pain).     [provider]  diphenhydrAMINE (BENADRYL) 25 mg capsule Take 25 mg by mouth every 6 (six) hours as needed for itching.    [provider]  glucose 4 GM chewable tablet Chew 3-4 tablets by mouth as needed for low blood sugar (blood sugar less than 60).     [provider]  HYDROcodone-acetaminophen (NORCO/VICODIN) 5-325 MG tablet Take 1 tablet  by mouth every 6 (six) hours as needed for moderate pain or severe pain. Patient taking differently: Take 1 tablet by mouth 3 (three) times daily as needed (for severe pain.).  04/29/16   Loletha Grayer, MD  naloxone [facility]) 0.4 MG/ML injection Inject 0.4 mg into the vein daily as needed (for opoid overdose).    [provider]  nitroGLYCERIN (NITROSTAT) 0.4 MG SL tablet Place 0.4 mg under the tongue every 5 (five) minutes x 3 doses as needed.    [provider]  NOVOLOG FLEXPEN 100 UNIT/ML FlexPen Inject 4-12 Units into the skin 3 (three) times daily before meals. Per sliding scale 12/03/16   [provider]  promethazine (PHENERGAN) 25 MG tablet Take 12.5 mg by mouth every 6 (six) hours as needed for nausea or vomiting.    [provider]     Allergies Ace inhibitors; Ativan [lorazepam]; Compazine [prochlorperazine edisylate]; Sumatriptan succinate; Dilaudid [hydromorphone hcl]; Ondansetron; Zofran [ondansetron hcl]; Codeine; Gabapentin; [place]; Losartan; Oxycodone; Prochlorperazine; Reglan [metoclopramide]; Scopolamine; Tape; and Tapentadol  Family History  Problem Relation Age of Onset  . Kidney disease Mother   . Diabetes Mother   . Cancer Father   . Kidney disease Sister     Social History Social History  Substance Use Topics  . Smoking status: Former Smoker    Packs/day: 0.50    Types: Cigarettes    Quit date: 02/13/2015    . Smokeless tobacco: Never Used  . Alcohol use 0.6 oz/week    1 Glasses of wine per week     Comment: glass wine week per pt    Review of Systems  Constitutional: No fever/chills Eyes: No visual changes.  ENT: No sore throat. Cardiovascular: As above Respiratory: As above Gastrointestinal: No abdominal pain.   Genitourinary: Negative for dysuria. Musculoskeletal: Negative for back pain. Skin: Negative for rash. Neurological: Negative for headaches   ____________________________________________   PHYSICAL EXAM:  VITAL SIGNS: ED Triage Vitals  Enc Vitals Group     BP 03/23/17 0946 (!) 191/83     Pulse --      Resp --      Temp 03/23/17 0946 98.4 F (36.9 C)     Temp src --      SpO2 03/23/17 0946 99 %     Weight 03/23/17 0946 81.2 kg (179 lb)     Height 03/23/17 0946 1.651 m (5\' 5" )     Head Circumference --      Peak Flow --      Pain Score 03/23/17 0945 8     Pain Loc --      Pain Edu? --      Excl. in [place]? --     Constitutional: Alert and oriented. No acute distress.  Eyes: Conjunctivae are normal.   Nose: No congestion/rhinnorhea. Mouth/Throat: Mucous membranes are moist.    Cardiovascular: Normal rate, regular rhythm. Grossly normal heart sounds.  Good peripheral circulation. Respiratory: Normal respiratory effort.  No retractions. Lungs CTAB. Gastrointestinal: Soft and nontender. No distention.  No CVA tenderness. Genitourinary: deferred Musculoskeletal: No lower extremity tenderness nor edema.  Warm and well perfused Neurologic:  Normal speech and language. No gross focal neurologic deficits are appreciated.  Skin:  Skin is warm, dry and intact. No rash noted. Psychiatric: Mood and affect are normal. Speech and behavior are normal.  ____________________________________________   LABS (all labs ordered are listed, but only abnormal results are displayed)  Labs Reviewed  BASIC METABOLIC PANEL - Abnormal; Notable for  the following:       Result  Value   Sodium 132 (*)    Chloride 92 (*)    Glucose, Bld 252 (*)    BUN 35 (*)    Creatinine, Ser 7.45 (*)    Calcium 8.1 (*)    GFR calc non Af Amer 5 (*)    GFR calc Af Amer 6 (*)    All other components within normal limits  CBC - Abnormal; Notable for the following:    RBC 3.41 (*)    Hemoglobin 11.0 (*)    HCT 32.5 (*)    RDW 18.0 (*)    All other components within normal limits  TROPONIN I   ____________________________________________  EKG  ED ECG REPORT I, Lavonia Drafts, the attending physician, personally viewed and interpreted this ECG.  Date: 03/23/2017 EKG Time: 9:39 AM Rate: 73 Rhythm: normal sinus rhythm QRS Axis: normal Intervals: normal ST/T Wave abnormalities: normal Narrative Interpretation: unremarkable  ____________________________________________  RADIOLOGY  Chest x-ray consistent with chronic congestive heart failure ____________________________________________   PROCEDURES  Procedure(s) performed: yes  Angiocath insertion Performed by: Lavonia Drafts  Consent: Verbal consent obtained. Risks and benefits: risks, benefits and alternatives were discussed Time out: Immediately prior to procedure a "time out" was called to verify the correct patient, procedure, equipment, support staff and site/side marked as required.  Preparation: Patient was prepped and draped in the usual sterile fashion.  Vein Location: right AC  Ultrasound Guided  Gauge: 18  Normal blood return and flush without difficulty Patient tolerance: Patient tolerated the procedure well with no immediate complications.       Critical Care performed: No ____________________________________________   INITIAL IMPRESSION / ASSESSMENT AND PLAN / ED COURSE  Pertinent labs & imaging results that were available during my care of the patient were reviewed by me and considered in my medical decision making (see chart for details).  Patient presented with chest pain.  Her EKG is reassuring. Chest x-ray is not significant change from prior. Labs pending  ----------------------------------------- 1:34 PM on 03/23/2017 -----------------------------------------  Initial labs are overall reassuring. Patient had nitroglycerin with some improvement in pain however she describes continued pain this time.  I will admit for further evaluation and management    ____________________________________________   FINAL CLINICAL IMPRESSION(S) / ED DIAGNOSES  Final diagnoses:  Chest pain, unspecified type      NEW MEDICATIONS STARTED DURING THIS VISIT:  New Prescriptions   No medications on file     Note:  This document was prepared using Dragon voice recognition software and may include unintentional dictation errors.    Lavonia Drafts, MD 03/23/17 1410

## 2017-03-23 NOTE — ED Notes (Signed)
Patient transported to X-ray 

## 2017-03-23 NOTE — ED Notes (Signed)
Hospitalist to bedside at this time 

## 2017-03-24 LAB — GLUCOSE, CAPILLARY
Glucose-Capillary: 138 mg/dL — ABNORMAL HIGH (ref 65–99)
Glucose-Capillary: 138 mg/dL — ABNORMAL HIGH (ref 65–99)
Glucose-Capillary: 111 mg/dL — ABNORMAL HIGH (ref 65–99)
Glucose-Capillary: 179 mg/dL — ABNORMAL HIGH (ref 65–99)
Glucose-Capillary: 216 mg/dL — ABNORMAL HIGH (ref 65–99)

## 2017-03-24 LAB — BASIC METABOLIC PANEL
Anion gap: 13 (ref 5–15)
BUN: 41 mg/dL — ABNORMAL HIGH (ref 6–20)
CO2: 27 mmol/L (ref 22–32)
Calcium: 8.2 mg/dL — ABNORMAL LOW (ref 8.9–10.3)
Chloride: 93 mmol/L — ABNORMAL LOW (ref 101–111)
Creatinine, Ser: 8.81 mg/dL — ABNORMAL HIGH (ref 0.44–1.00)
GFR calc Af Amer: 5 mL/min — ABNORMAL LOW (ref >60)
GFR calc non Af Amer: 4 mL/min — ABNORMAL LOW (ref >60)
Glucose, Bld: 133 mg/dL — ABNORMAL HIGH (ref 65–99)
Potassium: 5 mmol/L (ref 3.5–5.1)
Sodium: 133 mmol/L — ABNORMAL LOW (ref 135–145)

## 2017-03-24 LAB — HEMOGLOBIN A1C
Hgb A1c MFr Bld: 6.9 % — ABNORMAL HIGH (ref 4.8–5.6)
Mean Plasma Glucose: 151 mg/dL

## 2017-03-24 LAB — CBC
HCT: 33.3 % — ABNORMAL LOW (ref 35.0–47.0)
Hemoglobin: 11.1 g/dL — ABNORMAL LOW (ref 12.0–16.0)
MCH: 31.6 pg (ref 26.0–34.0)
MCHC: 33.3 g/dL (ref 32.0–36.0)
MCV: 94.7 fL (ref 80.0–100.0)
Platelets: 237 10*3/uL (ref 150–440)
RBC: 3.52 MIL/uL — ABNORMAL LOW (ref 3.80–5.20)
RDW: 19.4 % — ABNORMAL HIGH (ref 11.5–14.5)
WBC: 8.3 10*3/uL (ref 3.6–11.0)

## 2017-03-24 LAB — TROPONIN I: Troponin I: <0.03 ng/mL (ref <0.03)

## 2017-03-24 MED ORDER — ALUM & MAG HYDROXIDE-SIMETH 200-200-20 MG/5ML PO SUSP
30.0000 mL | ORAL | Status: DC
  Administered 2017-03-24 – 2017-03-25 (×4): 30 mL via ORAL
  Filled 2017-03-24 (×5): qty 30

## 2017-03-24 MED ORDER — ALUM & MAG HYDROXIDE-SIMETH 200-200-20 MG/5ML PO SUSP
15.0000 mL | Freq: Four times a day (QID) | ORAL | Status: DC | PRN
  Administered 2017-03-24: 15 mL via ORAL
  Filled 2017-03-24: qty 30

## 2017-03-24 MED ORDER — FAMOTIDINE 20 MG PO TABS
40.0000 mg | ORAL_TABLET | Freq: Every day | ORAL | Status: DC
  Administered 2017-03-25: 40 mg via ORAL
  Filled 2017-03-24: qty 2

## 2017-03-24 MED ORDER — ALUM & MAG HYDROXIDE-SIMETH 200-200-20 MG/5ML PO SUSP: 30.0000 mL | Freq: Four times a day (QID) | ORAL | Status: DC | PRN

## 2017-03-24 NOTE — Progress Notes (Signed)
HD completed without issue. Unable to meet uf goal d/t bp drop x1, relieved with administration of normal saline. Patient was also given iv phenergan at 12pm for nausea. Report given to primary RN

## 2017-03-24 NOTE — Progress Notes (Signed)
[facility]  ROUNDING NOTE   Subjective:   Seen and examined on hemodialysis. Tolerating treatment. No issues with access.     HEMODIALYSIS FLOWSHEET:  Blood Flow Rate (mL/min): 400 mL/min Arterial Pressure (mmHg): -160 mmHg Venous Pressure (mmHg): 210 mmHg Transmembrane Pressure (mmHg): 70 mmHg Ultrafiltration Rate (mL/min): 1000 mL/min Dialysate Flow Rate (mL/min): 600 ml/min Conductivity: Machine : 14 Conductivity: Machine : 14 Dialysis Fluid Bolus: Normal Saline Bolus Amount (mL): 250 mL Dialysate Change: 2K   Objective:  Vital signs in last 24 hours:  Temp:  [97.5 F (36.4 C)-98.4 F (36.9 C)] 97.9 F (36.6 C) (07/03 0908) Pulse Rate:  [70-79] 75 (07/03 0914) Resp:  [12-20] 20 (07/03 0914) BP: (132-194)/(55-84) 171/71 (07/03 0914) SpO2:  [97 %-100 %] 100 % (07/03 0914) Weight:  [81.2 kg (179 lb)-82.6 kg (182 lb 1.6 oz)] 82.6 kg (182 lb 1.6 oz) (07/03 0908)  Weight change:  Filed Weights   03/23/17 1552 03/23/17 1700 03/24/17 0908  Weight: 81.2 kg (179 lb) 82.1 kg (181 lb) 82.6 kg (182 lb 1.6 oz)    Intake/Output: No intake/output data recorded.   Intake/Output this shift:  No intake/output data recorded.  Physical Exam: General: NAD,   Head: Normocephalic, atraumatic. Moist oral mucosal membranes  Eyes: Anicteric, PERRL  Neck: Supple, trachea midline  Lungs:  Clear to auscultation  Heart: Regular rate and rhythm  Abdomen:  Soft, nontender,   Extremities: No peripheral edema. Left forearm in caste  Neurologic: Nonfocal, moving all four extremities  Skin: No lesions  Access: Right AVF    Basic Metabolic Panel:  Recent Labs Lab 03/20/17 1954 03/23/17 1056 03/24/17 0726  NA 135 132* 133*  K 3.7 4.8 5.0  CL 95* 92* 93*  CO2 29 29 27   GLUCOSE 239* 252* 133*  BUN 14 35* 41*  CREATININE 3.35* 7.45* 8.81*  CALCIUM 7.9* 8.1* 8.2*    Liver Function Tests:  Recent Labs Lab 03/20/17 1954  AST 21  ALT 15  ALKPHOS 152*  BILITOT  0.8  PROT 7.7  ALBUMIN 3.6    Recent Labs Lab 03/20/17 1954  LIPASE 20   No results for input(s): AMMONIA in the last 168 hours.  CBC:  Recent Labs Lab 03/20/17 1954 03/23/17 1056 03/24/17 0726  WBC 5.6 7.5 8.3  HGB 10.4* 11.0* 11.1*  HCT 31.0* 32.5* 33.3*  MCV 92.8 95.4 94.7  PLT 223 242 237    Cardiac Enzymes:  Recent Labs Lab 03/23/17 1056 03/23/17 1549 03/23/17 2221 03/24/17 0726  TROPONINI <0.03 <0.03 <0.03 <0.03    BNP: Invalid input(s): POCBNP  CBG:  Recent Labs Lab 03/23/17 1635 03/23/17 2050 03/23/17 2152 03/24/17 0445 03/24/17 0747  GLUCAP 183* 65* 90 138* 138*    Microbiology: Results for orders placed or performed during the hospital encounter of 03/23/17  MRSA PCR Screening     Status: None   Collection Time: 03/23/17  4:38 PM  Result Value Ref Range Status   MRSA by PCR NEGATIVE NEGATIVE Final    Comment:        The GeneXpert MRSA Assay (FDA approved for NASAL specimens only), is one component of a comprehensive MRSA colonization surveillance program. It is not intended to diagnose MRSA infection nor to guide or monitor treatment for MRSA infections.     Coagulation Studies: No results for input(s): LABPROT, INR in the last 72 hours.  Urinalysis: No results for input(s): COLORURINE, LABSPEC, PHURINE, GLUCOSEU, HGBUR, BILIRUBINUR, KETONESUR, PROTEINUR, UROBILINOGEN, NITRITE, LEUKOCYTESUR in the last  72 hours.  Invalid input(s): APPERANCEUR    Imaging: Dg Chest 2 View  Result Date: 03/23/2017 CLINICAL DATA:  Chest pain and nausea EXAM: CHEST  2 VIEW COMPARISON:  December 05, 2016 FINDINGS: There is generalized interstitial prominence, likely interstitial pulmonary edema. There is a small right pleural effusion. There is cardiomegaly with mild pulmonary venous hypertension. No adenopathy. There is a stent in the left subclavian and axillary regions, stable. IMPRESSION: Findings felt to most likely represent chronic congestive  heart failure. It should be noted that underlying interstitial prominence from underlying interstitial disease including atypical pneumonia superimposed on apparent chronic congestive heart failure cannot be excluded radiographically. Appearance is similar to recent study. No airspace consolidation present. Electronically Signed   By: Lowella Grip III M.D.   On: 03/23/2017 10:33     Medications:    . acidophilus  1 capsule Oral Q lunch  . alum & mag hydroxide-simeth  30 mL Oral Q4H  . amLODipine  10 mg Oral Daily  . aspirin EC  81 mg Oral Daily  . atorvastatin  20 mg Oral QHS  . famotidine  20 mg Oral Daily  . FLUoxetine  20 mg Oral QHS  . heparin  5,000 Units Subcutaneous Q8H  . hydrALAZINE  25 mg Oral TID  . insulin aspart  0-5 Units Subcutaneous QHS  . insulin aspart  0-9 Units Subcutaneous TID WC  . lipase/protease/amylase  24,000 Units Oral Q1200  . lipase/protease/amylase  36,000 Units Oral BID WC  . loratadine  10 mg Oral Daily  . multivitamin  1 tablet Oral Daily  . nitroGLYCERIN  0.5 inch Topical Q6H  . OLANZapine  5 mg Oral QHS  . omega-3 acid ethyl esters  1 g Oral BID  . pantoprazole  40 mg Oral BID AC  . pregabalin  25 mg Oral Daily  . rifaximin  550 mg Oral BID  . sevelamer carbonate  800 mg Oral TID WC  . cyanocobalamin  1,000 mcg Oral Q lunch   acetaminophen **OR** acetaminophen, albuterol, alum & mag hydroxide-simeth, diphenhydrAMINE, hydrALAZINE, HYDROcodone-acetaminophen, promethazine  Assessment/ Plan:  [name] is a 56 y.o. black female with diabetes, hypertension, hyperlipidemia, diabetic retinopathy, diabetic neuropathy, diabetic gastroparesis, pancreatic insufficiency, depression, GERD. Renal masses  [facility] MWF [address]   1. End Stage Renal Disease: Seen and examined on hemodialysis. Tolerating treatment well. No issues with AVF this morning.  Dialysis today and then resume MWF schedule.   2. Hypertension: elevated. Restarted home blood  pressure regimen after dialysis.   3. Anemia of chronic kidney disease: hemoglobin 11.1 - Mircera as outpatient.   4. Secondary Hyperparathyroidism: PTH 1098 last month. Outpatient calcium and phosphorus at goal.  - Sevelamer with meals.    LOS: [place], [name] 7/3/20189:28 AM

## 2017-03-24 NOTE — Progress Notes (Signed)
Pre hd assessment  

## 2017-03-24 NOTE — Progress Notes (Signed)
Pre dialysis  

## 2017-03-24 NOTE — Progress Notes (Signed)
Patient woke up and requested medication for nausea. Primary RN notified. Brought medication to HD unit, however patient is currently sleeping. Continue to monitor.

## 2017-03-24 NOTE — Progress Notes (Signed)
[place] at [facility]                                                                                                                                                                                  Patient Demographics   [name], is a 56 y.o. female, DOB - 06-06-1961, RDE:[id number]  Admit date - 03/23/2017   Admitting Physician Gladstone Lighter, MD  Outpatient Primary MD for the patient is Burns, Ala Dach, MD   LOS - 1  Subjective: Patient admitted with chest pain and nausea continues to complain of nausea. Complaint of substernal burning. States unable to eat due to her nausea   Review of Systems:   CONSTITUTIONAL: No documented fever. No fatigue, weakness. No weight gain, no weight loss.  EYES: No blurry or double vision.  ENT: No tinnitus. No postnasal drip. No redness of the oropharynx.  RESPIRATORY: No cough, no wheeze, no hemoptysis. No dyspnea.  CARDIOVASCULAR: Positive chest pain. No orthopnea. No palpitations. No syncope.  GASTROINTESTINAL: Positive nausea, no vomiting or diarrhea. No abdominal pain. No melena or hematochezia.  GENITOURINARY: No dysuria or hematuria.  ENDOCRINE: No polyuria or nocturia. No heat or cold intolerance.  HEMATOLOGY: No anemia. No bruising. No bleeding.  INTEGUMENTARY: No rashes. No lesions.  MUSCULOSKELETAL: No arthritis. No swelling. No gout.  NEUROLOGIC: No numbness, tingling, or ataxia. No seizure-type activity.  PSYCHIATRIC: No anxiety. No insomnia. No ADD.    Vitals:   Vitals:   03/24/17 1201 03/24/17 1215 03/24/17 1219 03/24/17 1305  BP: 138/63 (!) 160/65 (!) 166/64 (!) 134/42  Pulse: 81 80 78 80  Resp: 19 20 16 17   Temp:  98 F (36.7 C)  98.1 F (36.7 C)  TempSrc:  Oral  Oral  SpO2: 100% 100% 100% 94%  Weight:  178 lb 2.1 oz (80.8 kg)  177 lb 3.2 oz (80.4 kg)  Height:        Wt Readings from Last 3 Encounters:  03/24/17 177 lb 3.2 oz (80.4 kg)  03/20/17 179 lb (81.2 kg)  03/12/17 179 lb  6.4 oz (81.4 kg)     Intake/Output Summary (Last 24 hours) at 03/24/17 1319 Last data filed at 03/24/17 1215  Gross per 24 hour  Intake              120 ml  Output             1828 ml  Net            -1708 ml    Physical Exam:   GENERAL: Pleasant-appearing in no apparent distress. Appears in HEAD, EYES, EARS, NOSE AND THROAT: Atraumatic, normocephalic. Extraocular muscles are  intact. Pupils equal and reactive to light. Sclerae anicteric. No conjunctival injection. No oro-pharyngeal erythema.  NECK: Supple. There is no jugular venous distention. No bruits, no lymphadenopathy, no thyromegaly.  HEART: Regular rate and rhythm,. No murmurs, no rubs, no clicks.  LUNGS: Clear to auscultation bilaterally. No rales or rhonchi. No wheezes.  ABDOMEN: Soft, flat, nontender, nondistended. Has good bowel sounds. No hepatosplenomegaly appreciated.  EXTREMITIES: No evidence of any cyanosis, clubbing, or peripheral edema.  +2 pedal and radial pulses bilaterally.  NEUROLOGIC: The patient is alert, awake, and oriented x3 with no focal motor or sensory deficits appreciated bilaterally.  SKIN: Moist and warm with no rashes appreciated.  Psych: Positive anxious, depressed LN: No inguinal LN enlargement    Antibiotics   Anti-infectives    Start     Dose/Rate Route Frequency Ordered Stop   03/23/17 2200  rifaximin (XIFAXAN) tablet 550 mg     550 mg Oral 2 times daily 03/23/17 1551        Medications   Scheduled Meds: . acidophilus  1 capsule Oral Q lunch  . alum & mag hydroxide-simeth  30 mL Oral Q4H  . amLODipine  10 mg Oral Daily  . aspirin EC  81 mg Oral Daily  . atorvastatin  20 mg Oral QHS  . famotidine  20 mg Oral Daily  . FLUoxetine  20 mg Oral QHS  . heparin  5,000 Units Subcutaneous Q8H  . hydrALAZINE  25 mg Oral TID  . insulin aspart  0-5 Units Subcutaneous QHS  . insulin aspart  0-9 Units Subcutaneous TID WC  . lipase/protease/amylase  24,000 Units Oral Q1200  .  lipase/protease/amylase  36,000 Units Oral BID WC  . loratadine  10 mg Oral Daily  . multivitamin  1 tablet Oral Daily  . nitroGLYCERIN  0.5 inch Topical Q6H  . OLANZapine  5 mg Oral QHS  . omega-3 acid ethyl esters  1 g Oral BID  . pantoprazole  40 mg Oral BID AC  . pregabalin  25 mg Oral Daily  . rifaximin  550 mg Oral BID  . sevelamer carbonate  800 mg Oral TID WC  . cyanocobalamin  1,000 mcg Oral Q lunch   Continuous Infusions: PRN Meds:.acetaminophen **OR** acetaminophen, albuterol, alum & mag hydroxide-simeth, diphenhydrAMINE, hydrALAZINE, HYDROcodone-acetaminophen, promethazine   Data Review:   Micro Results Recent Results (from the past 240 hour(s))  MRSA PCR Screening     Status: None   Collection Time: 03/23/17  4:38 PM  Result Value Ref Range Status   MRSA by PCR NEGATIVE NEGATIVE Final    Comment:        The GeneXpert MRSA Assay (FDA approved for NASAL specimens only), is one component of a comprehensive MRSA colonization surveillance program. It is not intended to diagnose MRSA infection nor to guide or monitor treatment for MRSA infections.     Radiology Reports Dg Chest 2 View  Result Date: 03/23/2017 CLINICAL DATA:  Chest pain and nausea EXAM: CHEST  2 VIEW COMPARISON:  December 05, 2016 FINDINGS: There is generalized interstitial prominence, likely interstitial pulmonary edema. There is a small right pleural effusion. There is cardiomegaly with mild pulmonary venous hypertension. No adenopathy. There is a stent in the left subclavian and axillary regions, stable. IMPRESSION: Findings felt to most likely represent chronic congestive heart failure. It should be noted that underlying interstitial prominence from underlying interstitial disease including atypical pneumonia superimposed on apparent chronic congestive heart failure cannot be excluded radiographically. Appearance is similar to  recent study. No airspace consolidation present. Electronically Signed   By:  Lowella Grip III M.D.   On: 03/23/2017 10:33     CBC  Recent Labs Lab 03/20/17 1954 03/23/17 1056 03/24/17 0726  WBC 5.6 7.5 8.3  HGB 10.4* 11.0* 11.1*  HCT 31.0* 32.5* 33.3*  PLT 223 242 237  MCV 92.8 95.4 94.7  MCH 31.0 32.3 31.6  MCHC 33.4 33.8 33.3  RDW 17.2* 18.0* 19.4*    Chemistries   Recent Labs Lab 03/20/17 1954 03/23/17 1056 03/24/17 0726  NA 135 132* 133*  K 3.7 4.8 5.0  CL 95* 92* 93*  CO2 29 29 27   GLUCOSE 239* 252* 133*  BUN 14 35* 41*  CREATININE 3.35* 7.45* 8.81*  CALCIUM 7.9* 8.1* 8.2*  AST 21  --   --   ALT 15  --   --   ALKPHOS 152*  --   --   BILITOT 0.8  --   --    ------------------------------------------------------------------------------------------------------------------ estimated creatinine clearance is 6.8 mL/min (A) (by C-G formula based on SCr of 8.81 mg/dL (H)). ------------------------------------------------------------------------------------------------------------------  Recent Labs  03/23/17 1056  HGBA1C 6.9*   ------------------------------------------------------------------------------------------------------------------ No results for input(s): CHOL, HDL, LDLCALC, TRIG, CHOLHDL, LDLDIRECT in the last 72 hours. ------------------------------------------------------------------------------------------------------------------ No results for input(s): TSH, T4TOTAL, T3FREE, THYROIDAB in the last 72 hours.  Invalid input(s): FREET3 ------------------------------------------------------------------------------------------------------------------ No results for input(s): VITAMINB12, FOLATE, FERRITIN, TIBC, IRON, RETICCTPCT in the last 72 hours.  Coagulation profile No results for input(s): INR, PROTIME in the last 168 hours.  No results for input(s): DDIMER in the last 72 hours.  Cardiac Enzymes  Recent Labs Lab 03/23/17 1549 03/23/17 2221 03/24/17 0726  TROPONINI <0.03 <0.03 <0.03    ------------------------------------------------------------------------------------------------------------------ Invalid input(s): POCBNP    Assessment & Plan   [name]  is a 56 y.o. female with a known history of Anemia of chronic disease, end-stage renal disease on Monday Wednesday Friday hemodialysis, COPD not on home oxygen, CAD, diabetes, neuropathy, hypertension, arthritis and GERD presents to hospital secondary to nausea and chest pain.  #1 chest pain-atypical chest pain. Atypical in nature cardiac enzymes negative no further cardiac workup needed cardiac catheterization from 2016 shows minimal obstructive disease  #2 nausea appears to be a recurrent problem states that she is very nauseous at this time will treat her symptomatically advance diet hopefully discharge tomorrow  #3 end-stage renal dialysis-with acute pulmonary edema. Due for dialysis today. Hemodialyze today  #4 hypertension-continue home medications. Also on Nitropaste.  #5diabetes mellitus-also has hypoglycemic episodes often. Takes her Levemir as needed. Here in the hospital will place only on sliding scale insulin.  #5 DVT prophylaxis-on subcutaneous heparin     Code Status Orders        Start     Ordered   03/23/17 1552  Full code  Continuous     03/23/17 1551    Code Status History    Date Active Date Inactive Code Status Order ID Comments User Context   04/21/2016  3:59 PM 04/21/2016  5:17 PM Full Code [id number]  Nicholes Mango, MD Inpatient   04/08/2016  2:15 PM 04/11/2016  7:47 PM Full Code [id number]  Theodoro Grist, MD Inpatient   03/15/2016  1:22 PM 03/20/2016 12:19 AM Full Code [id number]  Bettey Costa, MD Inpatient   02/16/2016  2:09 PM 02/19/2016 10:38 PM Full Code [id number]  Bettey Costa, MD ED   12/17/2015 11:39 AM 12/21/2015 10:09 PM Full Code [id number]  Dustin Flock, MD ED  11/12/2015  4:02 PM 12/01/2015  7:43 PM Full Code [id number]  Bettey Costa, MD Inpatient   10/07/2015  9:37 AM  10/12/2015  7:59 PM Full Code [id number]  Fritzi Mandes, MD ED   10/03/2015  3:55 AM 10/05/2015 10:30 PM Full Code [id number]  Lance Coon, MD Inpatient   08/31/2015  4:00 PM 09/03/2015 10:09 PM Full Code [id number]  Demetrios Loll, MD ED   07/26/2015  3:28 PM 07/26/2015  9:36 PM Full Code [id number]  Dionisio David, MD Inpatient   07/22/2015  5:36 PM 07/26/2015  3:28 PM Full Code [id number]  Vaughan Basta, MD Inpatient   06/05/2015  8:39 PM 06/07/2015  8:03 PM Full Code [id number]  Bettey Costa, MD Inpatient   05/21/2015  9:42 PM 05/24/2015  4:26 PM Full Code [id number]  Henreitta Leber, MD Inpatient   04/24/2015  8:55 AM 04/26/2015 10:11 PM Full Code [id number]  Demetrios Loll, MD Inpatient   04/01/2015  3:58 PM 04/05/2015  8:07 PM Full Code [id number]  Epifanio Lesches, MD ED   03/06/2015  8:05 AM 03/09/2015  9:11 PM Full Code [id number]  Juluis Mire, MD Inpatient   02/19/2015  4:14 PM 02/24/2015  5:41 PM Full Code [id number]  Aldean Jewett, MD Inpatient           Consults  Nephrology   DVT Prophylaxis heparin  Lab Results  Component Value Date   PLT 237 03/24/2017     Time Spent in minutes   60min  Greater than 50% of time spent in care coordination and counseling patient regarding the condition and plan of care.   Dustin Flock M.D on 03/24/2017 at 1:19 PM  Between 7am to 6pm - Pager - [phone]  After 6pm go to [email or web address] - password [email or web address] [place] [facility]   Office  [phone]

## 2017-03-24 NOTE — Progress Notes (Signed)
Inpatient Diabetes Program Recommendations  AACE/ADA: New Consensus Statement on Inpatient Glycemic Control (2015)  Target Ranges:  Prepandial:   less than 140 mg/dL      Peak postprandial:   less than 180 mg/dL (1-2 hours)      Critically ill patients:  140 - 180 mg/dL   Lab Results  Component Value Date   GLUCAP 138 (H) 03/24/2017   HGBA1C 6.9 (H) 03/23/2017    Review of Glycemic Control  Results for KINDRED, HEYING (MRN [id number]) as of 03/24/2017 09:49  Ref. Range 03/23/2017 16:35 03/23/2017 20:50 03/23/2017 21:52 03/24/2017 04:45 03/24/2017 07:47  Glucose-Capillary Latest Ref Range: 65 - 99 mg/dL 183 (H) 53 (L) 90 138 (H) 138 (H)    Diabetes history: Type 2 Outpatient Diabetes medications: Levemir 4 units bid, Novolog 4-12 units per sliding scale  Current orders for Inpatient glycemic control: Novolog 0-9 units tid, Novolog 0-5 units qhs  Inpatient Diabetes Program Recommendations:  May want to consider a custom correction scale tid; (since the patient went low with 2 units of Novolog when her CBG was 183 and she ate 100%)  70-120mg /dl   0 units 121-150mg /dl 0 units 151-200mg /dl 0 unit 201-250mg /dl 1 unit 251-300mg /dl 1 unit 301-350mg /dl 2 units 351-400mg /dl 3 units > 401            4 units  Gentry Fitz, RN, [place], [place], CDE Diabetes Coordinator Inpatient Diabetes Program  [phone] (Team Pager) [phone] ([place]) 03/24/2017 10:05 AM

## 2017-03-24 NOTE — Progress Notes (Signed)
HD initiated without issue via 15g needles x2. 3 hour treatment, 2.5L goal per orders. Dr. Juleen China at bedside in HD unit

## 2017-03-24 NOTE — Progress Notes (Addendum)
A&O. Up with one assist. Complained of nausea and heartburn during the night. Given maalox, phenergen and norco. Early in the night, blood sugar was low in the 50's. Juice given, and sugar has been stable. Patient to have dialysis access declotted today. Will continue to monitor.

## 2017-03-24 NOTE — Progress Notes (Signed)
Post hd assessment unchanged  

## 2017-03-25 LAB — HIV ANTIBODY (ROUTINE TESTING W REFLEX): HIV Screen 4th Generation wRfx: NONREACTIVE

## 2017-03-25 LAB — GLUCOSE, CAPILLARY
Glucose-Capillary: 191 mg/dL — ABNORMAL HIGH (ref 65–99)
Glucose-Capillary: 199 mg/dL — ABNORMAL HIGH (ref 65–99)

## 2017-03-25 MED ORDER — CLOPIDOGREL BISULFATE 75 MG PO TABS: 75.0000 mg | ORAL_TABLET | Freq: Every day | ORAL | 2 refills | Status: AC

## 2017-03-25 MED ORDER — ALUM & MAG HYDROXIDE-SIMETH 200-200-20 MG/5ML PO SUSP: 30.0000 mL | Freq: Four times a day (QID) | ORAL | 0 refills | Status: DC | PRN

## 2017-03-25 MED ORDER — FAMOTIDINE 20 MG PO TABS: 20.0000 mg | ORAL_TABLET | Freq: Every day | ORAL | Status: DC

## 2017-03-25 MED ORDER — CLOPIDOGREL BISULFATE 75 MG PO TABS: 75.0000 mg | ORAL_TABLET | Freq: Every day | ORAL | 11 refills | Status: DC

## 2017-03-25 NOTE — Care Management Note (Signed)
Case Management Note  Patient Details  Name: [name] MRN: [id number] Date of Birth: 09-Nov-1960  Subjective/Objective:  Spoke with patient, she lives at home with her 2 sisters. She has and uses a BSC, wheelchair and cane. She uses a [employer] for transport to dialysis. She denies any home health care and doesn't feel like she needs home health. No home O2. Sats WNL on RA at this point. PCP is [facility]. No further needs identified.         Action/Plan: Case Closed  Expected Discharge Date:  03/25/17               Expected Discharge Plan:  Home/Self Care  In-House Referral:     Discharge planning Services  CM Consult  Post Acute Care Choice:    Choice offered to:     DME Arranged:    DME Agency:     HH Arranged:    HH Agency:     Status of Service:  Completed, signed off  If discussed at [employer] of Stay Meetings, dates discussed:    Additional Comments:  Jolly Mango, RN 03/25/2017, 1:48 PM

## 2017-03-25 NOTE — Discharge Instructions (Signed)
[place] at [place]:  Renal diet, diabetic  DISCHARGE CONDITION:  Stable  ACTIVITY:  Activity as tolerated  OXYGEN:  Home Oxygen: No.   Oxygen Delivery: room air  DISCHARGE LOCATION:  home    ADDITIONAL DISCHARGE INSTRUCTION:   If you experience worsening of your admission symptoms, develop shortness of breath, life threatening emergency, suicidal or homicidal thoughts you must seek medical attention immediately by calling 911 or calling your MD immediately  if symptoms less severe.  You Must read complete instructions/literature along with all the possible adverse reactions/side effects for all the Medicines you take and that have been prescribed to you. Take any new Medicines after you have completely understood and accpet all the possible adverse reactions/side effects.   Please note  You were cared for by a hospitalist during your hospital stay. If you have any questions about your discharge medications or the care you received while you were in the hospital after you are discharged, you can call the unit and asked to speak with the hospitalist on call if the hospitalist that took care of you is not available. Once you are discharged, your primary care physician will handle any further medical issues. Please note that NO REFILLS for any discharge medications will be authorized once you are discharged, as it is imperative that you return to your primary care physician (or establish a relationship with a primary care physician if you do not have one) for your aftercare needs so that they can reassess your need for medications and monitor your lab values.

## 2017-03-25 NOTE — Progress Notes (Signed)
Pnt has been resting and appears to be asleep. Pnt appears to be comfortable. No issues or concerns at this time in the shift.  Bed low and locked, call bell in reach. Will continue to monitor and assess.

## 2017-03-25 NOTE — Progress Notes (Signed)
Pre hd info 

## 2017-03-25 NOTE — Care Management Note (Signed)
Case Management Note  Patient Details  Name: MESSIAH ROVIRA MRN: [id number] Date of Birth: 1961/04/05  Subjective/Objective: Attempted assessment and patient in dialysis. Dialysis M- W-F. Dialysis coordinator has been notified of admission by previous case manager. Will follow progression.                    Action/Plan:   Expected Discharge Date:  03/25/17               Expected Discharge Plan:     In-House Referral:     Discharge planning Services  CM Consult  Post Acute Care Choice:    Choice offered to:     DME Arranged:    DME Agency:     HH Arranged:    HH Agency:     Status of Service:  In process, will continue to follow  If discussed at Long Length of Stay Meetings, dates discussed:    Additional Comments:  Jolly Mango, RN 03/25/2017, 9:32 AM

## 2017-03-25 NOTE — Progress Notes (Signed)
Pre hd assessment  

## 2017-03-25 NOTE — Progress Notes (Signed)
Discharge instructions reviewed with patient. Patient verbalized understanding. IV removed, dressing applied. Voices no concerns or questions upon discharge.

## 2017-03-25 NOTE — Progress Notes (Signed)
  End of hd 

## 2017-03-25 NOTE — Progress Notes (Signed)
[facility]  ROUNDING NOTE   Subjective:   Seen and examined on hemodialysis. Tolerating treatment. No issues with access. Last dialysis yesterday.    HEMODIALYSIS FLOWSHEET:  Blood Flow Rate (mL/min): 300 mL/min Arterial Pressure (mmHg): -260 mmHg Venous Pressure (mmHg): 120 mmHg Transmembrane Pressure (mmHg): 70 mmHg Ultrafiltration Rate (mL/min): 660 mL/min Dialysate Flow Rate (mL/min): 600 ml/min Conductivity: Machine : 13.7 Conductivity: Machine : 13.7 Dialysis Fluid Bolus: Normal Saline Bolus Amount (mL): 250 mL Dialysate Change:  (3k)   Objective:  Vital signs in last 24 hours:  Temp:  [97.8 F (36.6 C)-98.6 F (37 C)] 98.4 F (36.9 C) (07/04 0933) Pulse Rate:  [78-86] 80 (07/04 1115) Resp:  [13-20] 13 (07/04 1115) BP: (76-174)/(42-78) 166/78 (07/04 1115) SpO2:  [94 %-100 %] 100 % (07/04 1115) Weight:  [80.4 kg (177 lb 3.2 oz)-84.1 kg (185 lb 6.5 oz)] 84.1 kg (185 lb 6.5 oz) (07/04 0933)  Weight change: 1.406 kg (3 lb 1.6 oz) Filed Weights   03/24/17 1215 03/24/17 1305 03/25/17 0933  Weight: 80.8 kg (178 lb 2.1 oz) 80.4 kg (177 lb 3.2 oz) 84.1 kg (185 lb 6.5 oz)    Intake/Output: I/O last 3 completed shifts: In: 360 [P.O.:360] Out: 1828 [Urine:100; Other:1728]   Intake/Output this shift:  Total I/O In: 240 [P.O.:240] Out: -   Physical Exam: General: NAD,   Head: Normocephalic, atraumatic. Moist oral mucosal membranes  Eyes: Anicteric, PERRL  Neck: Supple, trachea midline  Lungs:  Clear to auscultation  Heart: Regular rate and rhythm  Abdomen:  Soft, nontender,   Extremities: No peripheral edema. Left forearm in caste  Neurologic: Nonfocal, moving all four extremities  Skin: No lesions  Access: Right AVG    Basic Metabolic Panel:  Recent Labs Lab 03/20/17 1954 03/23/17 1056 03/24/17 0726  NA 135 132* 133*  K 3.7 4.8 5.0  CL 95* 92* 93*  CO2 29 29 27   GLUCOSE 239* 252* 133*  BUN 14 35* 41*  CREATININE 3.35* 7.45* 8.81*   CALCIUM 7.9* 8.1* 8.2*    Liver Function Tests:  Recent Labs Lab 03/20/17 1954  AST 21  ALT 15  ALKPHOS 152*  BILITOT 0.8  PROT 7.7  ALBUMIN 3.6    Recent Labs Lab 03/20/17 1954  LIPASE 20   No results for input(s): AMMONIA in the last 168 hours.  CBC:  Recent Labs Lab 03/20/17 1954 03/23/17 1056 03/24/17 0726  WBC 5.6 7.5 8.3  HGB 10.4* 11.0* 11.1*  HCT 31.0* 32.5* 33.3*  MCV 92.8 95.4 94.7  PLT 223 242 237    Cardiac Enzymes:  Recent Labs Lab 03/23/17 1056 03/23/17 1549 03/23/17 2221 03/24/17 0726  TROPONINI <0.03 <0.03 <0.03 <0.03    BNP: Invalid input(s): POCBNP  CBG:  Recent Labs Lab 03/24/17 0747 03/24/17 1308 03/24/17 1637 03/24/17 2121 03/25/17 0759  GLUCAP 138* 111* 179* 216* 191*    Microbiology: Results for orders placed or performed during the hospital encounter of 03/23/17  MRSA PCR Screening     Status: None   Collection Time: 03/23/17  4:38 PM  Result Value Ref Range Status   MRSA by PCR NEGATIVE NEGATIVE Final    Comment:        The GeneXpert MRSA Assay (FDA approved for NASAL specimens only), is one component of a comprehensive MRSA colonization surveillance program. It is not intended to diagnose MRSA infection nor to guide or monitor treatment for MRSA infections.     Coagulation Studies: No results for input(s): LABPROT,  INR in the last 72 hours.  Urinalysis: No results for input(s): COLORURINE, LABSPEC, PHURINE, GLUCOSEU, HGBUR, BILIRUBINUR, KETONESUR, PROTEINUR, UROBILINOGEN, NITRITE, LEUKOCYTESUR in the last 72 hours.  Invalid input(s): APPERANCEUR    Imaging: No results found.   Medications:    . acidophilus  1 capsule Oral Q lunch  . alum & mag hydroxide-simeth  30 mL Oral Q4H  . amLODipine  10 mg Oral Daily  . aspirin EC  81 mg Oral Daily  . atorvastatin  20 mg Oral QHS  . [START ON 03/26/2017] famotidine  20 mg Oral Daily  . FLUoxetine  20 mg Oral QHS  . heparin  5,000 Units  Subcutaneous Q8H  . hydrALAZINE  25 mg Oral TID  . insulin aspart  0-5 Units Subcutaneous QHS  . insulin aspart  0-9 Units Subcutaneous TID WC  . lipase/protease/amylase  24,000 Units Oral Q1200  . lipase/protease/amylase  36,000 Units Oral BID WC  . loratadine  10 mg Oral Daily  . multivitamin  1 tablet Oral Daily  . nitroGLYCERIN  0.5 inch Topical Q6H  . OLANZapine  5 mg Oral QHS  . omega-3 acid ethyl esters  1 g Oral BID  . pantoprazole  40 mg Oral BID AC  . pregabalin  25 mg Oral Daily  . rifaximin  550 mg Oral BID  . sevelamer carbonate  800 mg Oral TID WC  . cyanocobalamin  1,000 mcg Oral Q lunch   acetaminophen **OR** acetaminophen, albuterol, alum & mag hydroxide-simeth, diphenhydrAMINE, hydrALAZINE, HYDROcodone-acetaminophen, promethazine  Assessment/ Plan:  [name] is a 56 y.o. black female with diabetes, hypertension, hyperlipidemia, diabetic retinopathy, diabetic neuropathy, diabetic gastroparesis, pancreatic insufficiency, depression, GERD. Renal masses  [facility] MWF [address]   1. End Stage Renal Disease: Seen and examined on hemodialysis. Tolerating treatment well. No issues with AVG Continue MWF schedule.   2. Hypertension: elevated. Restarted home blood pressure regimen after dialysis.   3. Anemia of chronic kidney disease: hemoglobin 11.1 - Mircera as outpatient.   4. Secondary Hyperparathyroidism: PTH 1098 last month. Outpatient calcium and phosphorus at goal.  - Sevelamer with meals.    LOS: [place], [place] 7/4/201811:35 AM

## 2017-03-25 NOTE — Discharge Summary (Signed)
[facility] - [place] [facility], 56 y.o., DOB [birth year], MRN [id number]. Admission date: 03/23/2017 Discharge Date 03/25/2017 Primary MD Ellamae Sia, MD Admitting Physician Gladstone Lighter, MD  Admission Diagnosis  Chest pain, unspecified type [R07.9]  Discharge Diagnosis   Active Problems:   Acute on chronic diastolic CHF   Atypical chest pain   Hypertension   End-stage renal disease   Chronic nausea and abdominal pain   Essential hypertension  Diabetes type 2 COPD     Hospital Course  Patient is 56 year old female with history of anemia of chronic disease, end-stage renal disease, COPD, diabetes, neuropathy, height per retention was had multiple admissions for various reasons presented with complaining of having chest pain and nausea. Patient's cardiac enzymes were negative. She had a cardiac catheter in 2016 which showed minimal slough C of coronary artery disease her symptoms were very atypical. Her chest pain is now resolved. Due to fluid overload she was dialyzed 2 times. Her breathing is much better. She is also having chronic nausea which was treated.            Consults  nephrology  Significant Tests:  See full reports for all details     Dg Chest 2 View  Result Date: 03/23/2017 CLINICAL DATA:  Chest pain and nausea EXAM: CHEST  2 VIEW COMPARISON:  December 05, 2016 FINDINGS: There is generalized interstitial prominence, likely interstitial pulmonary edema. There is a small right pleural effusion. There is cardiomegaly with mild pulmonary venous hypertension. No adenopathy. There is a stent in the left subclavian and axillary regions, stable. IMPRESSION: Findings felt to most likely represent chronic congestive heart failure. It should be noted that underlying interstitial prominence from underlying interstitial disease including atypical pneumonia superimposed on apparent chronic congestive heart failure cannot be excluded  radiographically. Appearance is similar to recent study. No airspace consolidation present. Electronically Signed   By: Lowella Grip III M.D.   On: 03/23/2017 10:33       Today   Subjective:   Gerrie Nordmann patient doing better denies any chest pain shortness of breath  Objective:   Blood pressure (!) 161/58, pulse 81, temperature 98.4 F (36.9 C), temperature source Oral, resp. rate 13, height 5\' 1"  (1.549 m), weight 185 lb 6.5 oz (84.1 kg), SpO2 98 %.  .  Intake/Output Summary (Last 24 hours) at 03/25/17 1313 Last data filed at 03/25/17 1245  Gross per 24 hour  Intake              480 ml  Output             1500 ml  Net            -1020 ml    Exam VITAL SIGNS: Blood pressure (!) 161/58, pulse 81, temperature 98.4 F (36.9 C), temperature source Oral, resp. rate 13, height 5\' 1"  (1.549 m), weight 185 lb 6.5 oz (84.1 kg), SpO2 98 %.  GENERAL:  56 y.o.-year-old patient lying in the bed with no acute distress.  EYES: Pupils equal, round, reactive to light and accommodation. No scleral icterus. Extraocular muscles intact.  HEENT: Head atraumatic, normocephalic. Oropharynx and nasopharynx clear.  NECK:  Supple, no jugular venous distention. No thyroid enlargement, no tenderness.  LUNGS: Normal breath sounds bilaterally, no wheezing, rales,rhonchi or crepitation. No use of accessory muscles of respiration.  CARDIOVASCULAR: S1, S2 normal. No murmurs, rubs, or gallops.  ABDOMEN: Soft, nontender, nondistended. Bowel sounds present. No organomegaly or mass.  EXTREMITIES: No pedal edema, cyanosis, or clubbing.  NEUROLOGIC: Cranial nerves II through XII are intact. Muscle strength 5/5 in all extremities. Sensation intact. Gait not checked.  PSYCHIATRIC: The patient is alert and oriented x 3.  SKIN: No obvious rash, lesion, or ulcer.   Data Review     CBC w Diff:  Lab Results  Component Value Date   WBC 8.3 03/24/2017   HGB 11.1 (L) 03/24/2017   HGB 10.5 (L) 09/15/2014   HCT  33.3 (L) 03/24/2017   HCT 34.2 (L) 09/15/2014   PLT 237 03/24/2017   PLT 203 09/15/2014   LYMPHOPCT 39 03/12/2017   LYMPHOPCT 51.6 05/31/2014   BANDSPCT 0 04/07/2016   MONOPCT 7 03/12/2017   MONOPCT 9.6 05/31/2014   EOSPCT 9 03/12/2017   EOSPCT 4.6 05/31/2014   BASOPCT 1 03/12/2017   BASOPCT 0.4 05/31/2014   CMP:  Lab Results  Component Value Date   NA 133 (L) 03/24/2017   NA 135 (L) 09/15/2014   K 5.0 03/24/2017   K 4.8 09/15/2014   CL 93 (L) 03/24/2017   CL 99 09/15/2014   CO2 27 03/24/2017   CO2 26 09/15/2014   BUN 41 (H) 03/24/2017   BUN 19 (H) 09/15/2014   CREATININE 8.81 (H) 03/24/2017   CREATININE 6.79 (H) 09/15/2014   PROT 7.7 03/20/2017   PROT 7.2 09/15/2014   ALBUMIN 3.6 03/20/2017   ALBUMIN 3.0 (L) 09/15/2014   BILITOT 0.8 03/20/2017   BILITOT 0.5 09/15/2014   ALKPHOS 152 (H) 03/20/2017   ALKPHOS 105 09/15/2014   AST 21 03/20/2017   AST 43 (H) 09/15/2014   ALT 15 03/20/2017   ALT 16 09/15/2014  .  Micro Results Recent Results (from the past 240 hour(s))  MRSA PCR Screening     Status: None   Collection Time: 03/23/17  4:38 PM  Result Value Ref Range Status   MRSA by PCR NEGATIVE NEGATIVE Final    Comment:        The GeneXpert MRSA Assay (FDA approved for NASAL specimens only), is one component of a comprehensive MRSA colonization surveillance program. It is not intended to diagnose MRSA infection nor to guide or monitor treatment for MRSA infections.         Code Status Orders        Start     Ordered   03/23/17 1552  Full code  Continuous     03/23/17 1551    Code Status History    Date Active Date Inactive Code Status Order ID Comments User Context   04/21/2016  3:59 PM 04/21/2016  5:17 PM Full Code [id number]  Nicholes Mango, MD Inpatient   04/08/2016  2:15 PM 04/11/2016  7:47 PM Full Code [id number]  Theodoro Grist, MD Inpatient   03/15/2016  1:22 PM 03/20/2016 12:19 AM Full Code [id number]  Bettey Costa, MD Inpatient   02/16/2016  2:09  PM 02/19/2016 10:38 PM Full Code [id number]  Bettey Costa, MD ED   12/17/2015 11:39 AM 12/21/2015 10:09 PM Full Code [id number]  Dustin Flock, MD ED   11/12/2015  4:02 PM 12/01/2015  7:43 PM Full Code [id number]  Bettey Costa, MD Inpatient   10/07/2015  9:37 AM 10/12/2015  7:59 PM Full Code [id number]  Fritzi Mandes, MD ED   10/03/2015  3:55 AM 10/05/2015 10:30 PM Full Code [id number]  Lance Coon, MD Inpatient   08/31/2015  4:00 PM 09/03/2015 10:09 PM Full Code [id number]  Demetrios Loll, MD ED   07/26/2015  3:28 PM 07/26/2015  9:36 PM Full Code [id number]  Dionisio David, MD Inpatient   07/22/2015  5:36 PM 07/26/2015  3:28 PM Full Code [id number]  Vaughan Basta, MD Inpatient   06/05/2015  8:39 PM 06/07/2015  8:03 PM Full Code [id number]  Bettey Costa, MD Inpatient   05/21/2015  9:42 PM 05/24/2015  4:26 PM Full Code [id number]  Henreitta Leber, MD Inpatient   04/24/2015  8:55 AM 04/26/2015 10:11 PM Full Code [id number]  Demetrios Loll, MD Inpatient   04/01/2015  3:58 PM 04/05/2015  8:07 PM Full Code [id number]  Epifanio Lesches, MD ED   03/06/2015  8:05 AM 03/09/2015  9:11 PM Full Code [id number]  Juluis Mire, MD Inpatient   02/19/2015  4:14 PM 02/24/2015  5:41 PM Full Code [id number]  Aldean Jewett, MD Inpatient          Follow-up Information    Burns, Ala Dach, MD Follow up in 1 week(s).   Specialty:  Internal Medicine Contact information: [place] [address] [phone]        Lavonia Dana, MD. Go on 03/27/2017.   Specialty:  Internal Medicine Why:  as scheduled Contact information: [address] [place] [address] [phone]           Discharge Medications   Allergies as of 03/25/2017      Reactions   Ace Inhibitors Swelling, Anaphylaxis   Ativan [lorazepam] Other (See Comments)   Reaction:Hallucinations and headaches Reaction:Hallucinations and headaches Reaction:  Hallucinations and headaches   Compazine [prochlorperazine  Edisylate] Anaphylaxis, Nausea And Vomiting   Other reaction(s): dystonia from this vs. reglan 23 Jul - patient relates that she takes promethazine frequently with no problems. 23 Jul - patient relates that she takes promethazine frequently with no problems.   Sumatriptan Succinate    Other reaction(s): delirium and hallucinations per [facility] records   Dilaudid [hydromorphone Hcl] Other (See Comments)   Delirium   Ondansetron Other (See Comments)   hallucinations    Zofran [ondansetron Hcl] Other (See Comments)   Reaction:  hallucinations  Reaction:  hallucinations    Codeine Nausea And Vomiting   Gabapentin Other (See Comments)   Reaction:  Unknown    L[place] Bovis Nausea And Vomiting   Losartan Nausea Only   Oxycodone Anxiety   Prochlorperazine Other (See Comments)   Reaction:  Unknown . Patient does not remember reaction but she does have vertigo and anxiety along with n and v at times. Could be used to treat any of these   Reglan [metoclopramide]    Per patient her Dr. Evelina Bucy her off it  Per patient her Dr. Evelina Bucy her off it    Scopolamine Other (See Comments)   Dizziness, also has vertigo already   Tape Rash   Tapentadol Rash      Medication List    STOP taking these medications   naloxone 0.4 MG/ML injection Commonly known as:  NARCAN     TAKE these medications   acidophilus Caps capsule Take 1 capsule by mouth daily with lunch.   albuterol 108 (90 Base) MCG/ACT inhaler Commonly known as:  PROVENTIL HFA;VENTOLIN HFA Inhale 2 puffs into the lungs every 6 (six) hours as needed for shortness of breath.   albuterol (2.5 MG/3ML) 0.083% nebulizer solution Commonly known as:  PROVENTIL Take 2.5 mg by nebulization every 6 (six) hours as needed for wheezing or shortness of breath.   alum & mag hydroxide-simeth 200-200-20 MG/5ML  suspension Commonly known as:  MAALOX/MYLANTA Take 30 mLs by mouth every 6 (six) hours as needed for indigestion or heartburn.   amLODipine 10 MG  tablet Commonly known as:  NORVASC Take 10 mg by mouth daily.   aspirin EC 81 MG tablet Take 81 mg by mouth daily.   atorvastatin 40 MG tablet Commonly known as:  LIPITOR Take 20 mg by mouth at bedtime.   cetirizine 10 MG tablet Commonly known as:  ZYRTEC Take 10 mg by mouth daily.   clopidogrel 75 MG tablet Commonly known as:  PLAVIX Take 1 tablet (75 mg total) by mouth daily.   cyanocobalamin 1000 MCG tablet Take 1,000 mcg by mouth daily with lunch.   diclofenac sodium 1 % Gel Commonly known as:  VOLTAREN Apply 2 g topically 2 (two) times daily as needed (for leg pain).   diphenhydrAMINE 25 mg capsule Commonly known as:  BENADRYL Take 25 mg by mouth every 6 (six) hours as needed for itching.   FLUoxetine 20 MG capsule Commonly known as:  PROZAC Take 1 capsule (20 mg total) by mouth daily. What changed:  when to take this   glucose 4 GM chewable tablet Chew 3-4 tablets by mouth as needed for low blood sugar (blood sugar less than 60).   hydrALAZINE 25 MG tablet Commonly known as:  APRESOLINE Take 25 mg by mouth 3 (three) times daily.   HYDROcodone-acetaminophen 5-325 MG tablet Commonly known as:  NORCO/VICODIN Take 1 tablet by mouth every 6 (six) hours as needed for moderate pain or severe pain. What changed:  when to take this  reasons to take this   LEVEMIR FLEXTOUCH 100 UNIT/ML Pen Generic drug:  Insulin Detemir Inject 4 Units into the skin 2 (two) times daily.   lipase/protease/amylase 12000 units Cpep capsule Commonly known as:  CREON Take 24,000-36,000 Units by mouth 3 (three) times daily with meals. Take 36000 mg in the morning, 24000 midday & 36000 in the evening   meclizine 25 MG tablet Commonly known as:  ANTIVERT 25 mg   multivitamin Tabs tablet Take 1 tablet by mouth daily.   nitroGLYCERIN 0.4 MG SL tablet Commonly known as:  NITROSTAT Place 0.4 mg under the tongue every 5 (five) minutes x 3 doses as needed.   NOVOLOG FLEXPEN 100  UNIT/ML FlexPen Generic drug:  insulin aspart Inject 4-12 Units into the skin 3 (three) times daily before meals. Per sliding scale   OLANZapine 5 MG tablet Commonly known as:  ZYPREXA Take 0.5 tablets (2.5 mg total) by mouth at bedtime. What changed:  how much to take   omega-3 acid ethyl esters 1 g capsule Commonly known as:  LOVAZA Take 1 g by mouth 2 (two) times daily.   pantoprazole 40 MG tablet Commonly known as:  PROTONIX Take 1 tablet (40 mg total) by mouth 2 (two) times daily before a meal.   pregabalin 25 MG capsule Commonly known as:  LYRICA Take 25 mg by mouth daily.   promethazine 25 MG tablet Commonly known as:  PHENERGAN Take 12.5 mg by mouth every 6 (six) hours as needed for nausea or vomiting.   ranitidine 150 MG tablet Commonly known as:  ZANTAC Take 150 mg by mouth 2 (two) times daily. In the afternoon   rifaximin 550 MG Tabs tablet Commonly known as:  XIFAXAN Take 550 mg by mouth 2 (two) times daily.   sevelamer carbonate 800 MG tablet Commonly known as:  RENVELA Take 800 mg by mouth 3 (three)  times daily with meals.          Total Time in preparing paper work, data evaluation and todays exam - 35 minutes  Dustin Flock M.D on 03/25/2017 at 1:13 PM  [facility]   Office  [phone]

## 2017-03-25 NOTE — Progress Notes (Signed)
post hd assessment

## 2017-03-25 NOTE — Care Management Important Message (Signed)
Important Message  Patient Details  Name: [name] MRN: [id number] Date of Birth: 1961-04-24   Medicare Important Message Given:  N/A - LOS <3 / Initial given by admissions    Jolly Mango, RN 03/25/2017, 1:52 PM

## 2017-03-25 NOTE — Progress Notes (Signed)
Post hd vitals 

## 2017-03-25 NOTE — Plan of Care (Signed)
Problem: Pain Managment: Goal: General experience of comfort will improve Outcome: Progressing Patient will be free of pain and pain will be managed with PRN pain medications as per MD orders  Problem: Skin Integrity: Goal: Risk for impaired skin integrity will decrease Outcome: Progressing Skin will remain clean dry and intact while in the hosptial

## 2017-03-25 NOTE — Progress Notes (Signed)
Hd start 

## 2017-04-01 ENCOUNTER — Emergency Department: Payer: Medicare Other

## 2017-04-01 ENCOUNTER — Emergency Department
Admission: EM | Admit: 2017-04-01 | Discharge: 2017-04-01 | Disposition: A | Discharge status: Home / Self Care | Payer: Medicare Other | Attending: Emergency Medicine | Admitting: Emergency Medicine

## 2017-04-01 DIAGNOSIS — R1011 Right upper quadrant pain: Secondary | ICD-10-CM

## 2017-04-01 DIAGNOSIS — Z992 Dependence on renal dialysis: Secondary | ICD-10-CM | POA: Diagnosis not present

## 2017-04-01 DIAGNOSIS — I5032 Chronic diastolic (congestive) heart failure: Secondary | ICD-10-CM | POA: Diagnosis not present

## 2017-04-01 DIAGNOSIS — R112 Nausea with vomiting, unspecified: Secondary | ICD-10-CM | POA: Insufficient documentation

## 2017-04-01 DIAGNOSIS — J45909 Unspecified asthma, uncomplicated: Secondary | ICD-10-CM | POA: Insufficient documentation

## 2017-04-01 DIAGNOSIS — N189 Chronic kidney disease, unspecified: Secondary | ICD-10-CM | POA: Diagnosis not present

## 2017-04-01 DIAGNOSIS — R1013 Epigastric pain: Secondary | ICD-10-CM | POA: Insufficient documentation

## 2017-04-01 DIAGNOSIS — I2510 Atherosclerotic heart disease of native coronary artery without angina pectoris: Secondary | ICD-10-CM | POA: Diagnosis not present

## 2017-04-01 DIAGNOSIS — J449 Chronic obstructive pulmonary disease, unspecified: Secondary | ICD-10-CM | POA: Insufficient documentation

## 2017-04-01 DIAGNOSIS — E119 Type 2 diabetes mellitus without complications: Secondary | ICD-10-CM | POA: Insufficient documentation

## 2017-04-01 DIAGNOSIS — Z87891 Personal history of nicotine dependence: Secondary | ICD-10-CM | POA: Diagnosis not present

## 2017-04-01 LAB — COMPREHENSIVE METABOLIC PANEL
ALT: 15 U/L (ref 14–54)
AST: 25 U/L (ref 15–41)
Albumin: 3.9 g/dL (ref 3.5–5.0)
Alkaline Phosphatase: 138 U/L — ABNORMAL HIGH (ref 38–126)
Anion gap: 13 (ref 5–15)
BUN: 15 mg/dL (ref 6–20)
CO2: 29 mmol/L (ref 22–32)
Calcium: 8.5 mg/dL — ABNORMAL LOW (ref 8.9–10.3)
Chloride: 95 mmol/L — ABNORMAL LOW (ref 101–111)
Creatinine, Ser: 4.51 mg/dL — ABNORMAL HIGH (ref 0.44–1.00)
GFR calc Af Amer: 12 mL/min — ABNORMAL LOW (ref >60)
GFR calc non Af Amer: 10 mL/min — ABNORMAL LOW (ref >60)
Glucose, Bld: 204 mg/dL — ABNORMAL HIGH (ref 65–99)
Potassium: 3.9 mmol/L (ref 3.5–5.1)
Sodium: 137 mmol/L (ref 135–145)
Total Bilirubin: 0.8 mg/dL (ref 0.3–1.2)
Total Protein: 8.3 g/dL — ABNORMAL HIGH (ref 6.5–8.1)

## 2017-04-01 LAB — CBC
HCT: 36.1 % (ref 35.0–47.0)
Hemoglobin: 11.7 g/dL — ABNORMAL LOW (ref 12.0–16.0)
MCH: 31.2 pg (ref 26.0–34.0)
MCHC: 32.4 g/dL (ref 32.0–36.0)
MCV: 96.3 fL (ref 80.0–100.0)
Platelets: 207 10*3/uL (ref 150–440)
RBC: 3.75 MIL/uL — ABNORMAL LOW (ref 3.80–5.20)
RDW: 18 % — ABNORMAL HIGH (ref 11.5–14.5)
WBC: 6.1 10*3/uL (ref 3.6–11.0)

## 2017-04-01 LAB — TROPONIN I: Troponin I: <0.03 ng/mL (ref <0.03)

## 2017-04-01 LAB — LIPASE, BLOOD: Lipase: 24 U/L (ref 11–51)

## 2017-04-01 MED ORDER — PROMETHAZINE HCL 25 MG PO TABS
ORAL_TABLET | ORAL | Status: AC
  Administered 2017-04-01: 25 mg
  Filled 2017-04-01: qty 1

## 2017-04-01 MED ORDER — HYDROMORPHONE HCL 1 MG/ML IJ SOLN: 0.5000 mg | Freq: Once | INTRAMUSCULAR | Status: DC

## 2017-04-01 MED ORDER — ONDANSETRON 4 MG PO TBDP
4.0000 mg | ORAL_TABLET | Freq: Once | ORAL | Status: DC
  Filled 2017-04-01: qty 1

## 2017-04-01 MED ORDER — FENTANYL CITRATE (PF) 100 MCG/2ML IJ SOLN
75.0000 ug | Freq: Once | INTRAMUSCULAR | Status: AC
  Administered 2017-04-01: 75 ug via INTRAMUSCULAR
  Filled 2017-04-01: qty 2

## 2017-04-01 MED ORDER — FENTANYL CITRATE (PF) 100 MCG/2ML IJ SOLN: 75.0000 ug | Freq: Once | INTRAMUSCULAR | Status: DC

## 2017-04-01 MED ORDER — ONDANSETRON HCL 4 MG/2ML IJ SOLN: 4.0000 mg | Freq: Once | INTRAMUSCULAR | Status: DC

## 2017-04-01 NOTE — ED Notes (Signed)
Pt is a dialysis pt and normally does not make any urine

## 2017-04-01 NOTE — ED Provider Notes (Signed)
[facility] Emergency Department Provider Note  ____________________________________________  Time seen: Approximately 3:28 PM  I have reviewed the triage vital signs and the nursing notes.   HISTORY  Chief Complaint Abdominal Pain and Emesis    HPI [name] is a 56 y.o. female with a history of ESRD on HD, CAD, CHF, COPD, history of pancreatitis, status post remote cholecystectomy and appendectomy, renal ca not currently under treatment, presenting with abdominal pain. The patient reports that she had 1 episode of vomiting this morning, and afterwards developed a sharp and severe pain in epigastric and right upper quadrant. This is typical of her "attacks" and is usually attributable to either acute pancreatitis or gastroparesis. She chronically has nausea. She went to hemodialysis today and after 5 minutes after to be brought to the emergency department for her pain. This pain is similar in character and severity as prior abdominal pain. She is having normal bowel movements without diarrhea or constipation and is passing gas normally. She has not had any fever or chills, sick contacts, or travel outside denies states patient denies any chest pain, shortness of breath, palpitations, lightheadedness or syncope.  Overall, this pain is typical for her, but she states "I wish someone would just be able to stop this from happening."   Past Medical History:  Diagnosis Date  . Anemia   . Anginal pain ([place])   . Anxiety   . Arthritis   . Asthma   . Broken wrist   . Bronchitis   . chronic diastolic CHF 1/61/0960  . COPD (chronic obstructive pulmonary disease) ([place])   . Coronary artery disease    a. cath 2013: stenting to RCA (report not available); b. cath 2014: LM nl, pLAD 40%, mLAD nl, ost LCx 40%, mid LCx nl, pRCA 30% @ site of prior stent, mRCA 50%  . Depression   . Diabetes mellitus without complication ([place])   . Diabetic neuropathy ([place])   . dialysis 2006   . Diverticulosis   . Dizziness   . Dyspnea   . Elevated lipids   . Environmental and seasonal allergies   . ESRD (end stage renal disease) on dialysis ([place])    M-W-F  . GERD (gastroesophageal reflux disease)   . Headache   . History of hiatal hernia   . HOH (hard of hearing)   . Hx of pancreatitis 2015  . Hypertension   . Lower extremity edema   . Mitral regurgitation    a. echo 10/2013: EF 62%, noWMA, mildly dilated LA, mild to mod MR/TR, GR1DD  . Myocardial infarction ([place])   . Orthopnea   . Pneumonia   . Renal cancer ([place])   . Renal insufficiency    Pt is on dialysis on M,W + F.  . Wheezing     Patient Active Problem List   Diagnosis Date Noted  . Cellulitis of lower extremity 07/29/2016  . Chronic venous insufficiency 07/29/2016  . Lymphedema 07/29/2016  . TIA (transient ischemic attack) 04/21/2016  . Altered mental status 04/08/2016  . Hyperammonemia ([place]) 04/08/2016  . Elevated troponin 04/08/2016  . ESRD (end stage renal disease) ([place]) 04/08/2016  . Depression 04/08/2016  . Depression, major, recurrent, severe with psychosis ([place]) 04/08/2016  . Blood in stool   . Intractable cyclical vomiting with nausea   . Reflux esophagitis   . Gastritis   . Generalized abdominal pain   . Uncontrollable vomiting   . Major depressive disorder, recurrent episode, moderate ([place]) 03/15/2016  .  Adjustment disorder with mixed anxiety and depressed mood 03/15/2016  . Malnutrition of moderate degree 12/01/2015  . Renal mass   . Dyspnea   . Acute renal failure ([place])   . Respiratory failure ([place])   . High temperature 11/14/2015  . Pulmonary edema   . Encounter for central line placement   . Encounter for orogastric (OG) tube placement   . Nausea 11/12/2015  . Hyperkalemia 10/03/2015  . Diarrhea, unspecified 07/22/2015  . Pneumonia 05/21/2015  . Hypoglycemia 04/24/2015  . Unresponsiveness 04/24/2015  . Bradycardia 04/24/2015  . Hypothermia 04/24/2015  . Acute respiratory  failure ([place]) 04/24/2015  . Acute diastolic CHF (congestive heart failure) ([place]) 04/05/2015  . Diabetic gastroparesis ([place]) 04/05/2015  . Hypokalemia 04/05/2015  . Generalized weakness 04/05/2015  . Acute pulmonary edema ([place]) 04/03/2015  . Nausea and vomiting 04/03/2015  . Hypoglycemia associated with diabetes ([place]) 04/03/2015  . Anemia of chronic disease 04/03/2015  . Secondary hyperparathyroidism ([place]) 04/03/2015  . Pressure ulcer 04/02/2015  . Acute respiratory failure with hypoxia ([place]) 04/01/2015  . Adjustment disorder with anxiety 03/14/2015  . Somatic symptom disorder, mild 03/08/2015  . Coronary artery disease involving native coronary artery of native heart without angina pectoris   . Nausea & vomiting 03/06/2015  . Abdominal pain 03/06/2015  . DM (diabetes mellitus) ([place]) 03/06/2015  . HTN (hypertension) 03/06/2015  . Gastroparesis 02/24/2015  . Pleural effusion 02/19/2015  . HCAP (healthcare-associated pneumonia) 02/19/2015  . End-stage renal disease on hemodialysis ([place]) 02/19/2015    Past Surgical History:  Procedure Laterality Date  . ABDOMINAL HYSTERECTOMY  1992  . APPENDECTOMY    . CARDIAC CATHETERIZATION Left 07/26/2015   Procedure: Left Heart Cath and Coronary Angiography;  Surgeon: Dionisio David, MD;  Location: [facility];  Service: Cardiovascular;  Laterality: Left;  . CATARACT EXTRACTION W/ INTRAOCULAR LENS IMPLANT Right   . CATARACT EXTRACTION W/PHACO Left 03/10/2017   Procedure: CATARACT EXTRACTION PHACO AND INTRAOCULAR LENS PLACEMENT (IOC);  Surgeon: Birder Robson, MD;  Location: [facility] ORS;  Service: Ophthalmology;  Laterality: Left;  [place] 00:51.9 AP% 14.2 CDE 7.39 fluid pack lot # [id number]  . CHOLECYSTECTOMY    . COLONOSCOPY WITH PROPOFOL N/A 08/12/2016   Procedure: COLONOSCOPY WITH PROPOFOL;  Surgeon: Lollie Sails, MD;  Location[facility] ENDOSCOPY;  Service: Endoscopy;  Laterality: N/A;  . DIALYSIS FISTULA CREATION Left    upper arm  .  dialysis grafts    . ESOPHAGOGASTRODUODENOSCOPY N/A 03/08/2015   Procedure: ESOPHAGOGASTRODUODENOSCOPY (EGD);  Surgeon: Manya Silvas, MD;  Location: [facility] ENDOSCOPY;  Service: Endoscopy;  Laterality: N/A;  . ESOPHAGOGASTRODUODENOSCOPY (EGD) WITH PROPOFOL N/A 03/18/2016   Procedure: ESOPHAGOGASTRODUODENOSCOPY (EGD) WITH PROPOFOL;  Surgeon: [name], MD;  Location: [facility] ENDOSCOPY;  Service: Endoscopy;  Laterality: N/A;  . EYE SURGERY Right 2018  . FECAL TRANSPLANT N/A 08/23/2015   Procedure: FECAL TRANSPLANT;  Surgeon: Manya Silvas, MD;  Location: [facility] ENDOSCOPY;  Service: Endoscopy;  Laterality: N/A;  . HAND SURGERY Bilateral   . PERIPHERAL VASCULAR CATHETERIZATION N/A 12/20/2015   Procedure: Thrombectomy of dialysis access versus permcath placement;  Surgeon: Algernon Huxley, MD;  Location: [facility];  Service: Cardiovascular;  Laterality: N/A;  . PERIPHERAL VASCULAR CATHETERIZATION N/A 12/20/2015   Procedure: A/V Shunt Intervention;  Surgeon: Algernon Huxley, MD;  Location: [facility];  Service: Cardiovascular;  Laterality: N/A;  . PERIPHERAL VASCULAR CATHETERIZATION N/A 12/20/2015   Procedure: A/V Shuntogram/Fistulagram;  Surgeon: Algernon Huxley, MD;  Location: [facility]  [facility];  Service: Cardiovascular;  Laterality: N/A;  . PERIPHERAL VASCULAR CATHETERIZATION N/A 01/02/2016   Procedure: A/V Shuntogram/Fistulagram;  Surgeon: Algernon Huxley, MD;  Location: [facility];  Service: Cardiovascular;  Laterality: N/A;  . PERIPHERAL VASCULAR CATHETERIZATION N/A 01/02/2016   Procedure: A/V Shunt Intervention;  Surgeon: Algernon Huxley, MD;  Location: [facility];  Service: Cardiovascular;  Laterality: N/A;    Current Outpatient Rx  . Order #: [id number] Class: Historical Med  . Order #: [id number] Class: Historical Med  . Order #: [id number] Class: Historical Med  . Order #: [id number] Class: Historical Med  . Order #: [id number] Class: Historical Med  . Order #: [id number] Class:  Print  . Order #: [id number] Class: Historical Med  . Order #: [id number] Class: Print  . Order #: [id number] Class: Historical Med  . Order #: [id number] Class: Historical Med  . Order #: [id number] Class: Historical Med  . Order #: [id number] Class: Historical Med  . Order #: [id number] Class: Historical Med  . Order #: [id number] Class: Print  . Order #: [id number] Class: Historical Med  . Order #: [id number] Class: Normal  . Order #: [id number] Class: Historical Med  . Order #: [id number] Class: Historical Med  . Order #: [id number] Class: Historical Med  . Order #: [id number] Class: Historical Med  . Order #: [id number] Class: Historical Med  . Order #: [id number] Class: Historical Med  . Order #: [id number] Class: Print  . Order #: [id number] Class: Historical Med  . Order #: [id number] Class: Historical Med  . Order #: [id number] Class: Historical Med  . Order #: [id number] Class: Print  . Order #: [id number] Class: Historical Med  . Order #: [id number] Class: Historical Med  . Order #: [id number] Class: Historical Med    Allergies Ace inhibitors; Ativan [lorazepam]; Compazine [prochlorperazine edisylate]; Sumatriptan succinate; Dilaudid [hydromorphone hcl]; Ondansetron; Zofran [ondansetron hcl]; Codeine; Gabapentin; [place] bovis; Losartan; Oxycodone; Prochlorperazine; Reglan [metoclopramide]; Scopolamine; Tape; and Tapentadol  Family History  Problem Relation Age of Onset  . Kidney disease Mother   . Diabetes Mother   . Cancer Father   . Kidney disease Sister     Social History Social History  Substance Use Topics  . Smoking status: Former Smoker    Packs/day: 0.50    Types: Cigarettes    Quit date: 02/13/2015  . Smokeless tobacco: Never Used  . Alcohol use 0.6 oz/week    1 Glasses of wine per week     Comment: glass wine week per pt    Review of Systems Constitutional: No fever/chills.No lightheadedness or syncope. Eyes: No visual changes. ENT: No sore throat. No congestion or rhinorrhea. Cardiovascular:  Denies chest pain. Denies palpitations. Respiratory: Denies shortness of breath.  No cough. Gastrointestinal: Positive epigastric and right upper quadrant abdominal pain.  Positive single episode of nausea, and vomiting.  No diarrhea.  No constipation. Genitourinary: No longer makes urine. Musculoskeletal: Negative for back pain. Skin: Negative for rash. Neurological: Negative for headaches. No focal numbness, tingling or weakness.     ____________________________________________   PHYSICAL EXAM:  VITAL SIGNS: ED Triage Vitals  Enc Vitals Group     BP 04/01/17 1322 (!) 187/65     Pulse Rate 04/01/17 1322 79     Resp 04/01/17 1322 20     Temp 04/01/17 1322 98.4 F (36.9 C)     Temp Source 04/01/17 1322 Oral     SpO2 04/01/17 1322 100 %     Weight 04/01/17 1323 185 lb (83.9 kg)     Height 04/01/17 1323 5\' 1"  (1.549 m)     Head Circumference --  Peak Flow --      Pain Score 04/01/17 1322 9     Pain Loc --      Pain Edu? --      Excl. in [place]? --     Constitutional: Alert and oriented. Chronically ill-appearing and uncomfortable but nontoxic. Answers questions appropriately. Eyes: Conjunctivae are normal.  EOMI. No scleral icterus. Head: Atraumatic. Nose: No congestion/rhinnorhea. Mouth/Throat: Mucous membranes are moist.  Neck: No stridor.  Supple.  No JVD. No meningismus. Cardiovascular: Normal rate, regular rhythm. No murmurs, rubs or gallops.  Respiratory: Normal respiratory effort.  No accessory muscle use or retractions. Lungs CTAB.  No wheezes, rales or ronchi. Gastrointestinal: Morbidly obese. Soft, and nondistended.  Tender to palpation in the epigastrium and right upper quadrant No guarding or rebound.  No peritoneal signs. Musculoskeletal: No LE edema.  Neurologic:  A&Ox3.  Speech is clear.  Face and smile are symmetric.  EOMI.  Moves all extremities well. Skin:  Skin is warm, dry and intact. No rash noted. Psychiatric: Mood and affect are normal. Speech and  behavior are normal.  Normal judgement.  ____________________________________________   LABS (all labs ordered are listed, but only abnormal results are displayed)  Labs Reviewed  COMPREHENSIVE METABOLIC PANEL - Abnormal; Notable for the following:       Result Value   Chloride 95 (*)    Glucose, Bld 204 (*)    Creatinine, Ser 4.51 (*)    Calcium 8.5 (*)    Total Protein 8.3 (*)    Alkaline Phosphatase 138 (*)    GFR calc non Af Amer 10 (*)    GFR calc Af Amer 12 (*)    All other components within normal limits  CBC - Abnormal; Notable for the following:    RBC 3.75 (*)    Hemoglobin 11.7 (*)    RDW 18.0 (*)    All other components within normal limits  LIPASE, BLOOD  TROPONIN I   ____________________________________________  EKG  ED ECG REPORT I, Eula Listen, the attending physician, personally viewed and interpreted this ECG.   Date: 04/01/2017  EKG Time: 1331  Rate: 74  Rhythm: normal sinus rhythm  Axis: normal  Intervals:prolonged QTc  ST&T Change: No STEMI  ____________________________________________  RADIOLOGY  No results found.  ____________________________________________   PROCEDURES  Procedure(s) performed: None  Procedures  Critical Care performed: No ____________________________________________   INITIAL IMPRESSION / ASSESSMENT AND PLAN / ED COURSE  Pertinent labs & imaging results that were available during my care of the patient were reviewed by me and considered in my medical decision making (see chart for details).  56 y.o. female with multiple medical comorbidities presenting with epigastric and right upper quadrant abdominal pain associated with nausea and vomiting similar to prior episodes. Here, the patient is mildly hypertensive but afebrile. She is uncomfortable appearing does have tenderness in the right upper quadrant. Given that she has had multiple episodes like this before, we'll initiate symptomatic treatment  with plain x-rays to evaluate for obstruction. Given that she has no fever, no elevation in her white blood cell count, no ages in her LFTs, additional imaging will be held at this time as the patient has undergone multiple CT scans in the past. Plan reevaluation for final disposition.  ----------------------------------------- 4:35 PM on 04/01/2017 -----------------------------------------  At this time, the patient is sleeping comfortably, and easily arouses to voice. Her pain has completely resolved and she is no longer nauseated. We'll do a by mouth challenge, and if  she is able to tolerate liquids, she'll be discharged home with with instructions to follow-up for hemodialysis as soon as possible and to go to her primary care physician for reevaluation of her symptoms. Return precautions as well as follow-up instructions were discussed. ____________________________________________  FINAL CLINICAL IMPRESSION(S) / ED DIAGNOSES  Final diagnoses:  Epigastric abdominal pain  Right upper quadrant abdominal pain  Non-intractable vomiting with nausea, unspecified vomiting type         NEW MEDICATIONS STARTED DURING THIS VISIT:  New Prescriptions   No medications on file      Eula Listen, MD 04/01/17 1636

## 2017-04-01 NOTE — ED Notes (Signed)
Pt moved to 1H - she was unable to sit up in wheel chair to be taken to lobby for son to pick her up - this nurse dressed pt but she refused to sit up and kept leaning forward - for fear of pt falling out of chair forward, this nurse placed pt back in bed and she will stay in ED until son [name]) arrives to take her home - pt arouses to verbal stimuli - denies any pain at this time - skin is warm/dry - pt denies N/V at this time

## 2017-04-01 NOTE — ED Notes (Signed)
Report given to Kendall RN 

## 2017-04-01 NOTE — ED Notes (Addendum)
Patient presents to the ED via EMS from dialysis.  Patient is complaining of right upper quadrant pain and tenderness.  Patient has history of pancreatitis and gastroparesis.  Patient states this feels like a "normal attack."  Patient was seen in the ED approx. 2 weeks ago for similar symptoms.  Patient has clamp on her dialysis fistula where the dialysis nurses d/c her treatment today-approx. 5 min. Prior to arrival.

## 2017-04-01 NOTE — Discharge Instructions (Signed)
Please take a clear liquid diet for the next 24 hours, then take a bland diet as tolerated.  Please call to reschedule your hemodialysis as soon as possible.  Return to the emergency department if you develop severe pain, inability to keep down fluids, fever, or any other symptoms concerning to you.

## 2017-04-01 NOTE — ED Triage Notes (Signed)
Patient presents to ER by EMS with c/o N/V and abdominal pain. Emesis X1. Dialysis patient M/W/F. Pt reports not getting through dialysis today due to abd pain. A&O x4

## 2017-04-01 NOTE — ED Notes (Addendum)
Pt reports that she has been nauseated since July 4th - she reports that her abd started hurting today when she left dialysis - pt has hx of pancreatitis

## 2017-04-02 ENCOUNTER — Other Ambulatory Visit: Payer: Self-pay | Admitting: Specialist

## 2017-04-02 ENCOUNTER — Encounter: Payer: Self-pay | Admitting: Anesthesiology

## 2017-04-02 ENCOUNTER — Inpatient Hospital Stay: Payer: Medicare Other | Admitting: Oncology

## 2017-04-04 ENCOUNTER — Encounter: Admission: RE | Payer: Self-pay | Source: Ambulatory Visit

## 2017-04-04 ENCOUNTER — Ambulatory Visit: Admission: RE | Admit: 2017-04-04 | Payer: Medicare Other | Source: Ambulatory Visit | Admitting: Specialist

## 2017-04-04 SURGERY — CARPAL TUNNEL RELEASE
Anesthesia: Choice | Laterality: Left

## 2017-04-10 IMAGING — CT CT ABD-PELV W/O CM
2 of 4 series · 16 of 46 positions shown, 18 images · non-contrast
Comparison: CT abdomen pelvis of 07/31/2016

CLINICAL DATA: Left lower abdomen pain, the patient is on dialysis

EXAM:
CT ABDOMEN AND PELVIS WITHOUT CONTRAST
TECHNIQUE: Multidetector CT imaging of the abdomen and pelvis was performed
following the standard protocol without IV contrast.

[Series 2: routine abd/pel wo · axial · 0.71mm/px · z∈[-614,-184]mm · 13 of 94 slices shown, 15 images]
[im 4/94  soft-tissue]
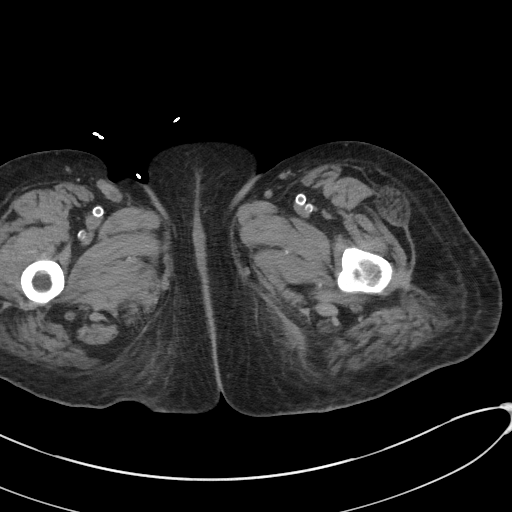
[im 4/94  bone]
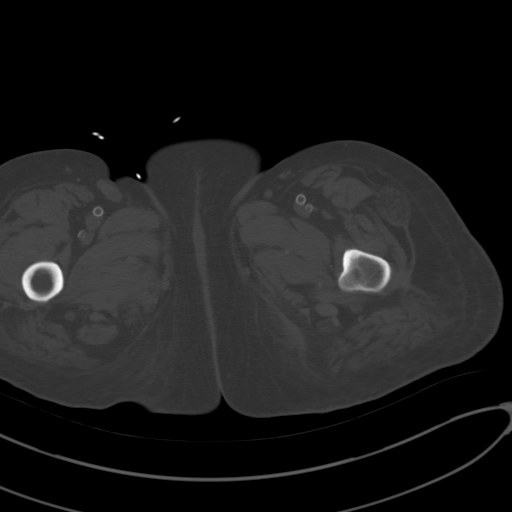
[im 12/94  soft-tissue]
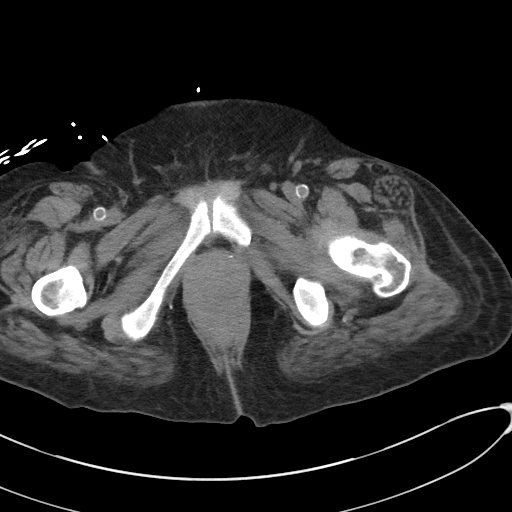
[im 19/94  soft-tissue]
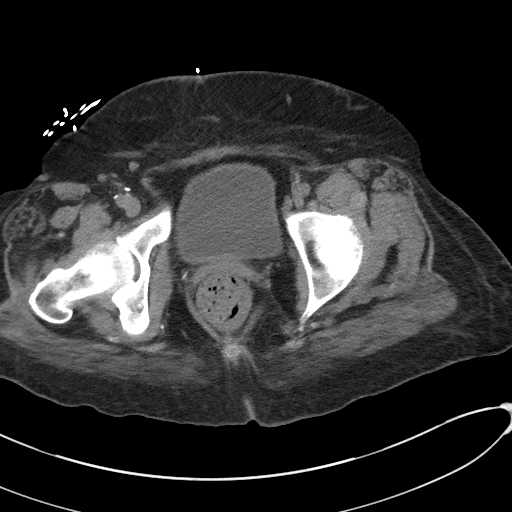
[im 27/94  soft-tissue]
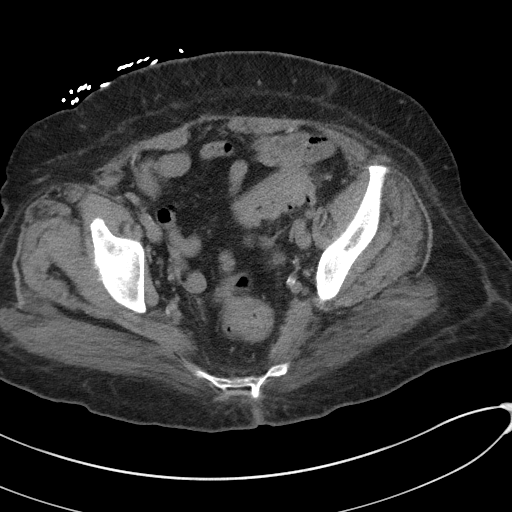
[im 34/94  soft-tissue]
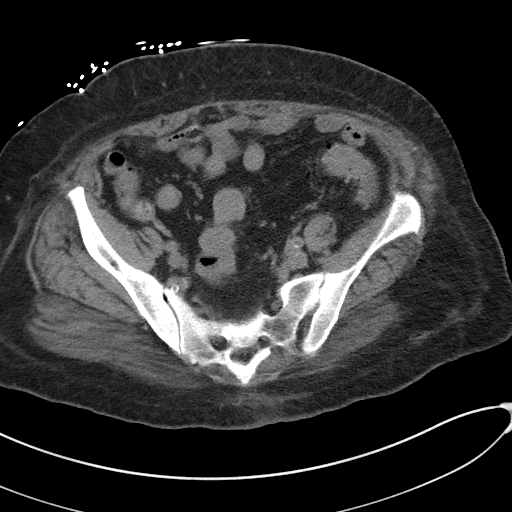
[im 41/94  soft-tissue]
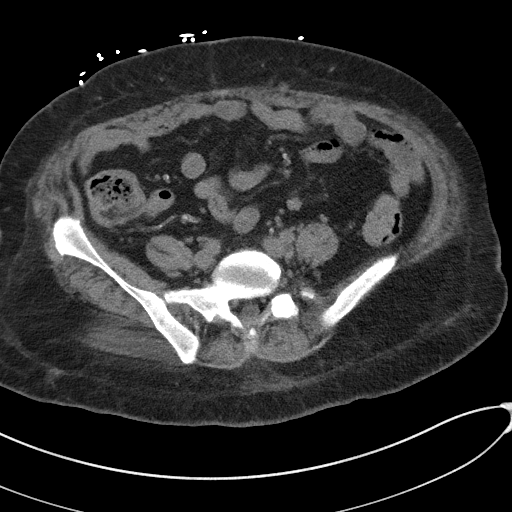
[im 49/94  soft-tissue]
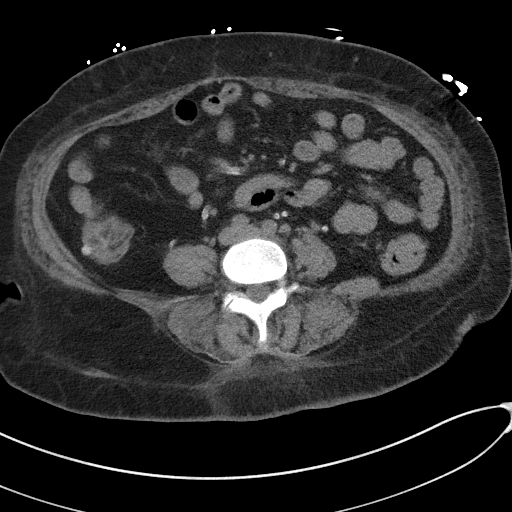
[im 53/94  soft-tissue]
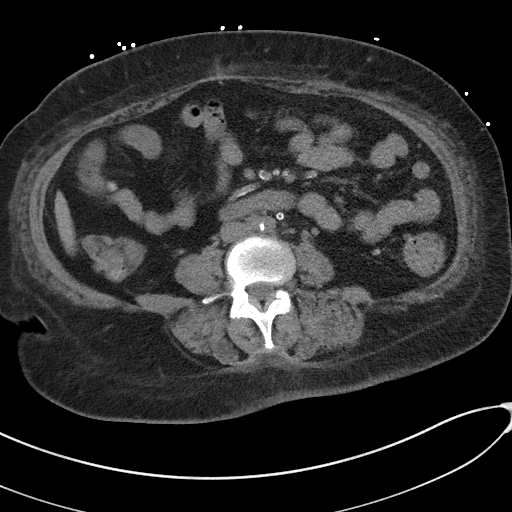
[im 60/94  soft-tissue]
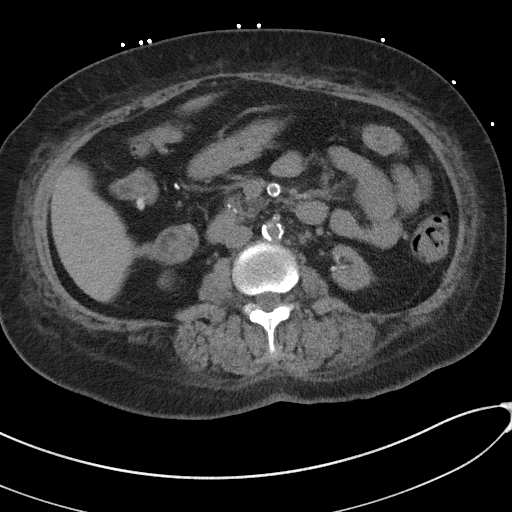
[im 60/94  bone]
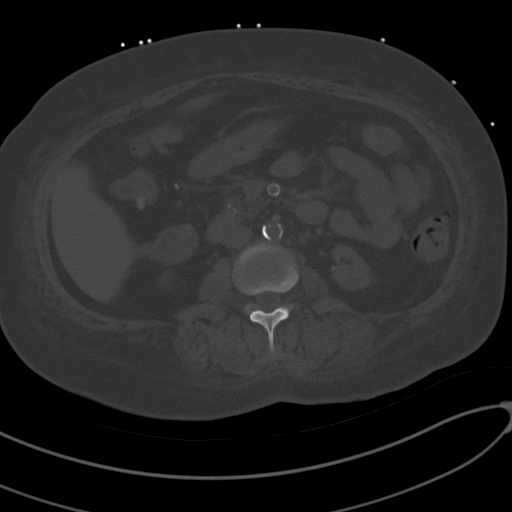
[im 67/94  soft-tissue]
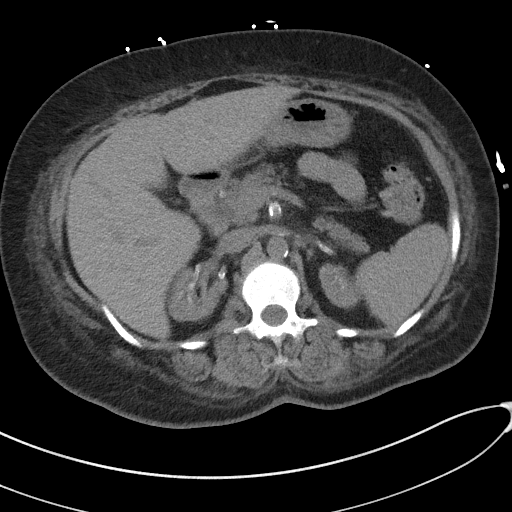
[im 75/94  soft-tissue]
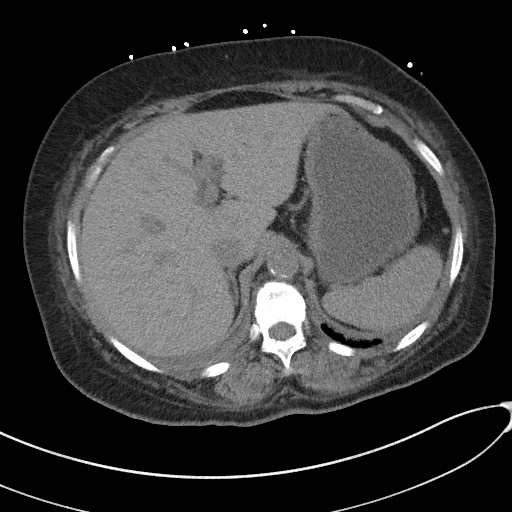
[im 82/94  soft-tissue]
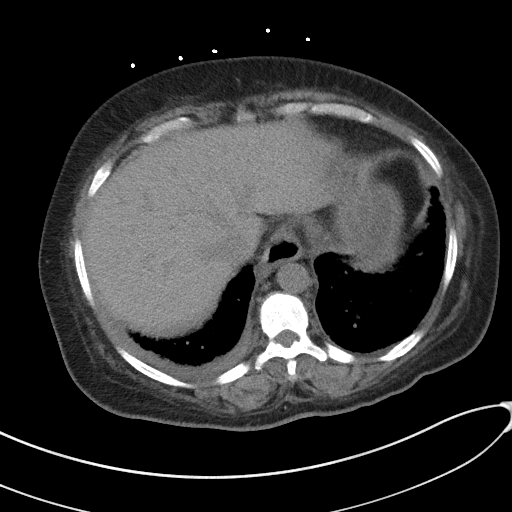
[im 90/94  soft-tissue]
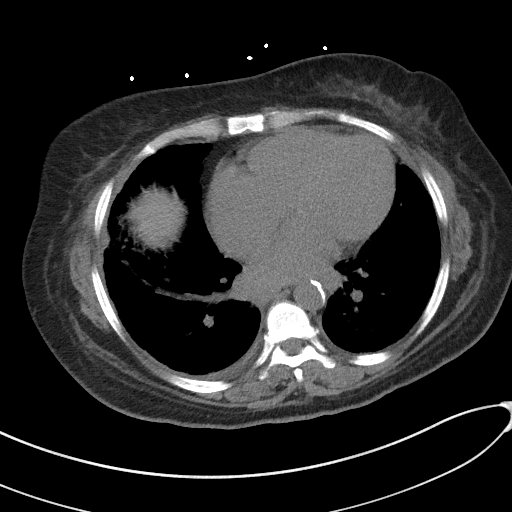

[Series 5: coronal st · coronal · 0.90mm/px · 3 of 101 slices shown]
[im 34/101  soft-tissue]
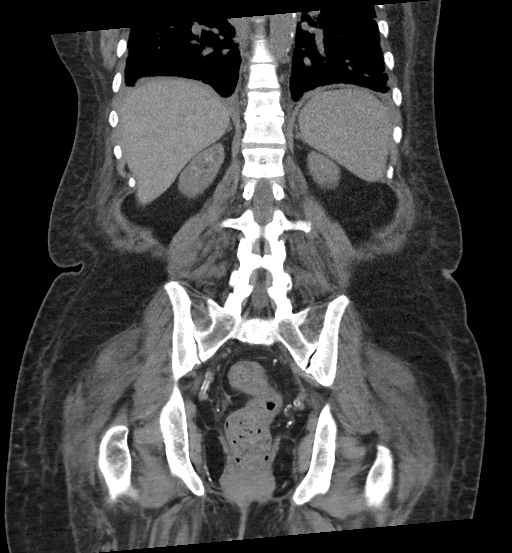
[im 45/101  soft-tissue]
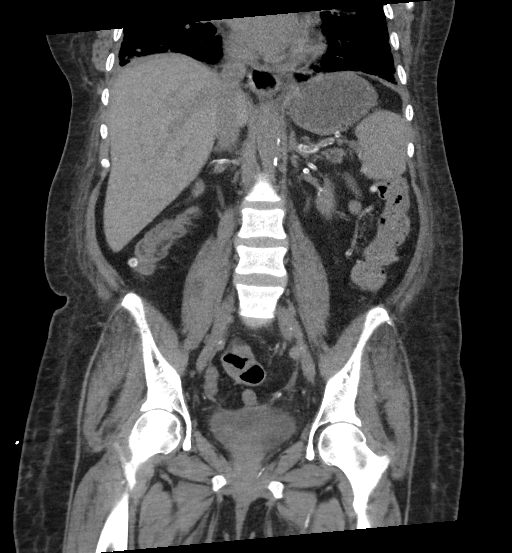
[im 56/101  soft-tissue]
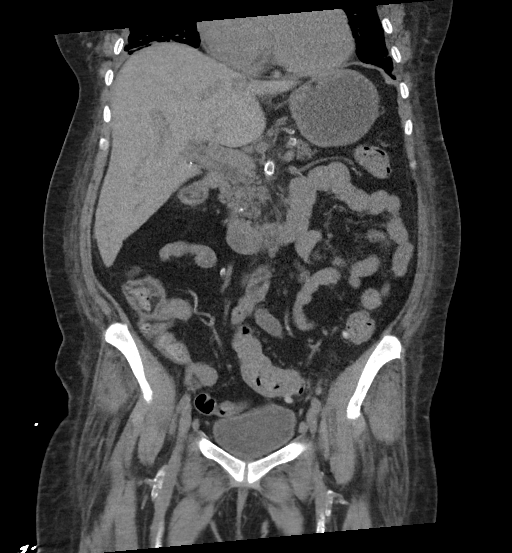

[16 of 46 positions shown; findings below may reference images not displayed]

FINDINGS: Lower chest: No pneumonia is seen. There is a small right pleural
effusion present. Cardiomegaly is stable. No pericardial effusion is
seen.

Hepatobiliary: The liver is unremarkable in the unenhanced state.
Surgical clips are noted from prior cholecystectomy.

Pancreas: The pancreas is normal in size and the pancreatic duct is
not dilated.

Spleen: The spleen is unremarkable.

Adrenals/Urinary Tract: The adrenal glands appear normal. The
kidneys are small and somewhat atrophic in this renal failure
patient on dialysis. No hydronephrosis is seen. Renovascular
calcification is present diffusely and there are arterial
calcifications diffusely as well.

Stomach/Bowel: The stomach is distended with fluid. No small bowel
distention is seen. There are diverticula within the rectosigmoid
colon and distal descending colon but no diverticulitis is seen.
There are also diverticula scattered throughout the more proximal
colon. The terminal ileum is unremarkable. The appendix has by
history been resected.

Vascular/Lymphatic: The abdominal aorta is normal in caliber with
moderate abdominal aortic atherosclerosis noted. No adenopathy is
seen.

Reproductive: The uterus has been resected previously. No adnexal
lesion is seen. No fluid is noted within the pelvis.

Other: There is strandiness throughout the soft tissues which may
indicate fluid overload status.

Musculoskeletal: The lumbar vertebrae are in normal alignment. No
compression deformity is seen.
IMPRESSION: 1. No explanation for the patient's left lower quadrant pain is
seen. There are colonic diverticula present but no diverticulitis is
noted.
2. Small right pleural effusion.  Cardiomegaly.
3. Atrophic native kidneys.  No hydronephrosis.
4. Moderate abdominal aortic atherosclerosis with diffuse arterial
calcifications.
5. Some strandiness throughout the soft tissues diffusely may
indicate fluid overload.

## 2017-04-12 IMAGING — CR DG ABDOMEN ACUTE W/ 1V CHEST
3 series · 3 of 3 positions shown · non-contrast
Comparison: 07/11/2016.

CLINICAL DATA: Pain.

EXAM:
DG ABDOMEN ACUTE W/ 1V CHEST

[chest pa]
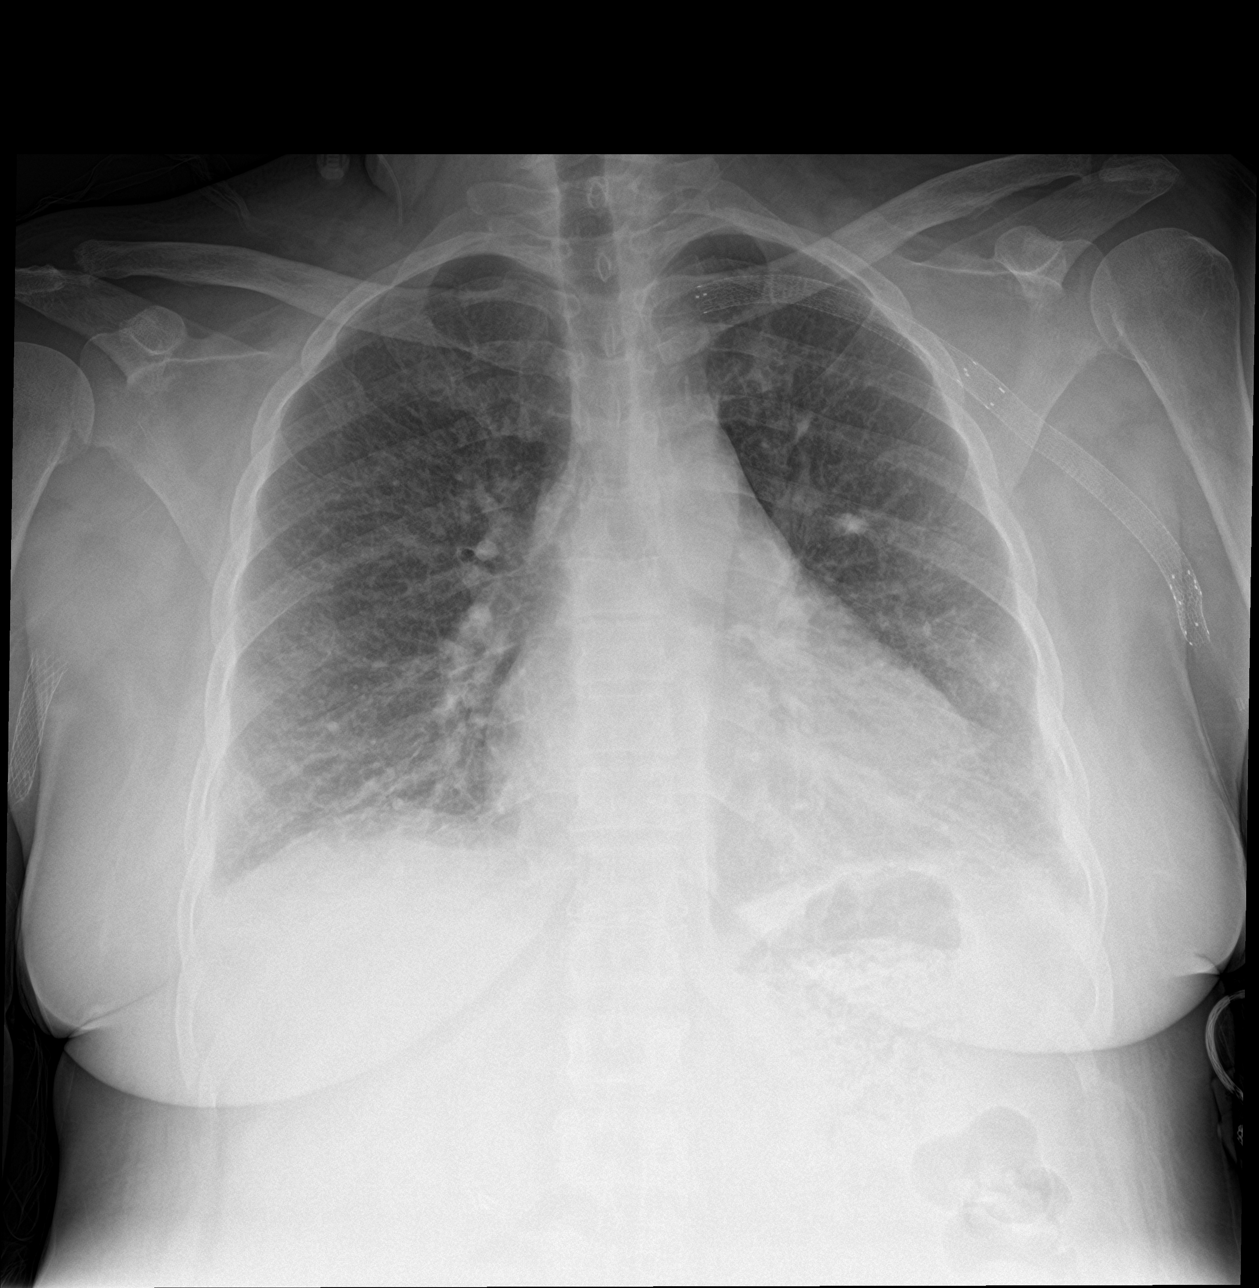

[abdomen erect]
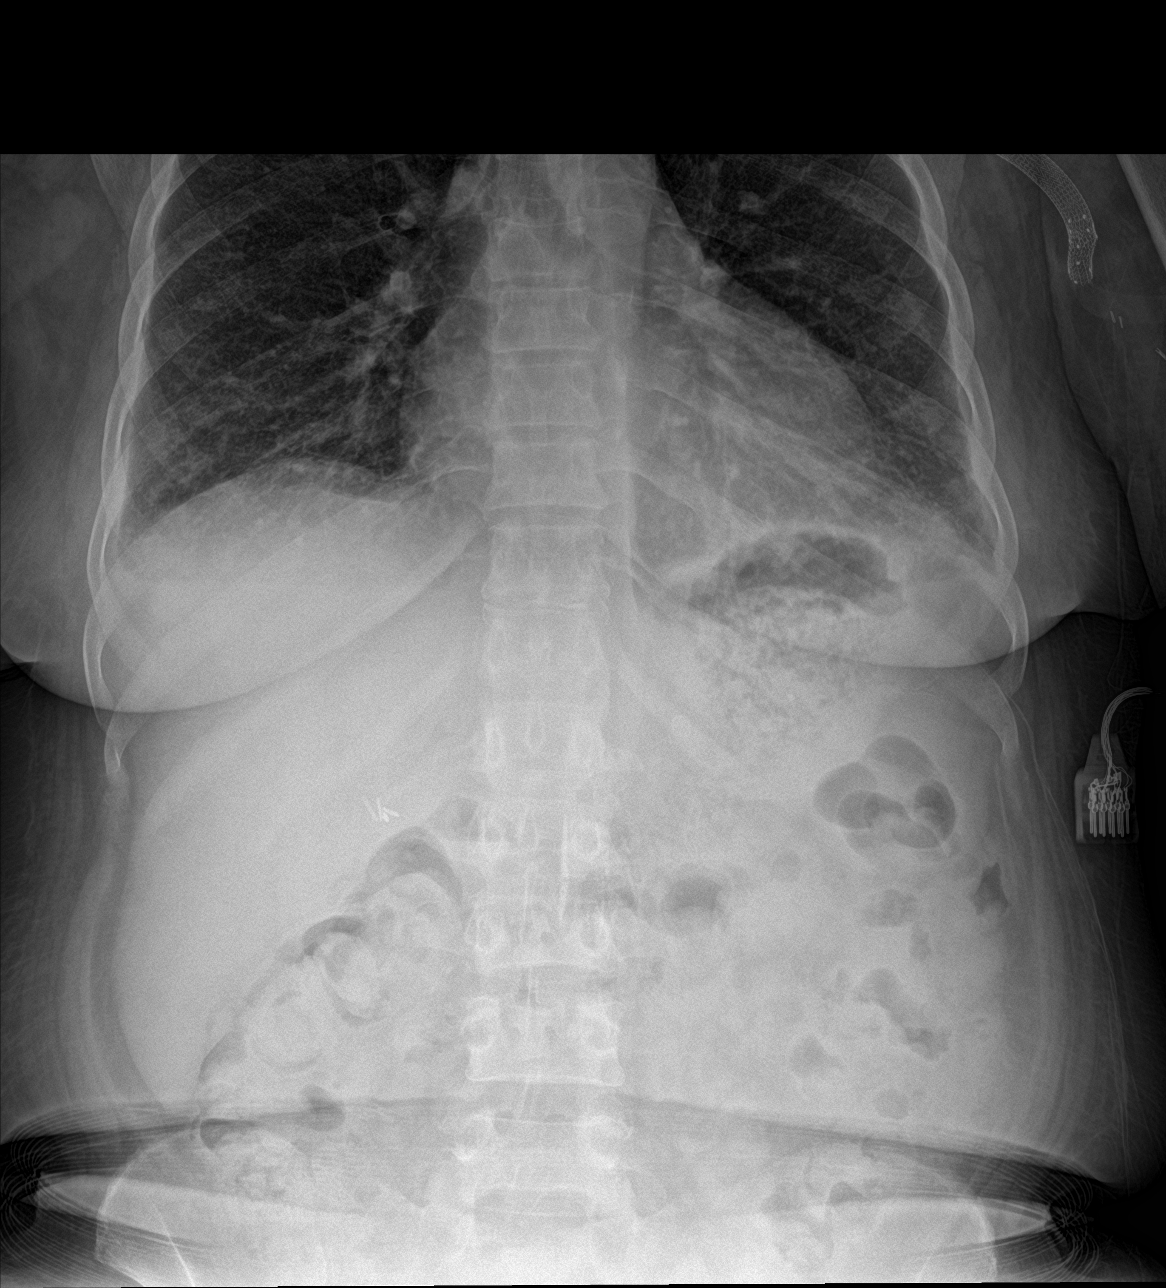

[abdomen supine]
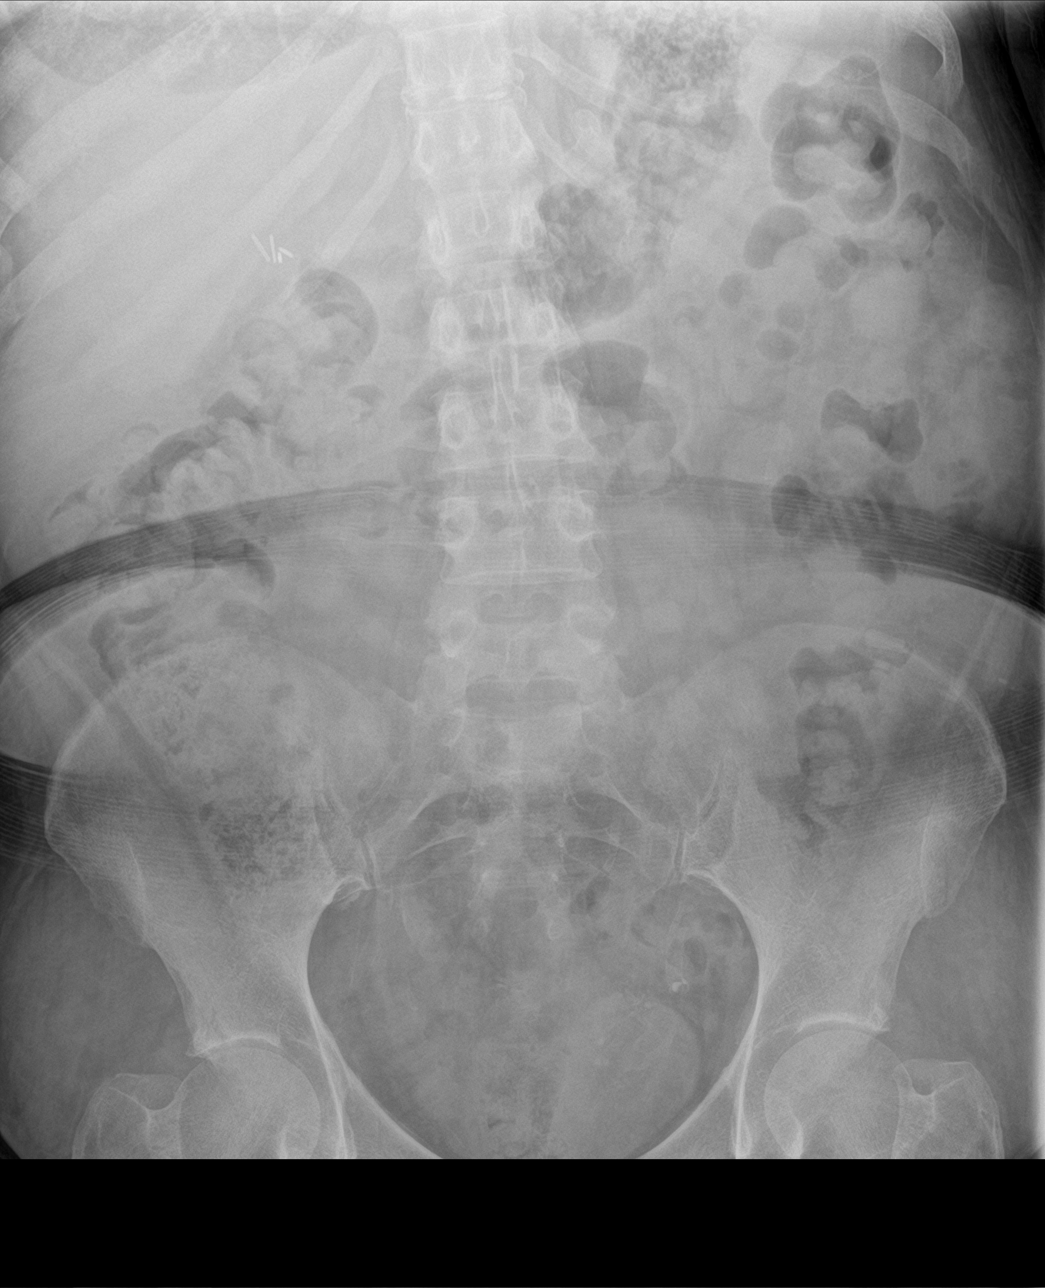

[3 of 3 positions shown; findings below may reference images not displayed]

FINDINGS: Cardiomegaly with diffuse bilateral from interstitial prominence
consistent CHF. Small bilateral pleural effusions. No pneumothorax.
Left subclavian and axillary stents are noted. Soft tissue
structures of the abdomen unremarkable. No bowel distention. Stool
noted throughout the colon. Pelvic calcifications most likely
vascular. Surgical clips right upper quadrant.
IMPRESSION: 1. Congestive heart failure with bilateral pulmonary interstitial
edema. Small bilateral pleural effusions.

2. Stool noted throughout the colon. No bowel distention. No acute
intra-abdominal abnormality .

## 2017-05-01 ENCOUNTER — Telehealth: Payer: Self-pay | Admitting: *Deleted

## 2017-05-01 NOTE — Telephone Encounter (Signed)
Called and spoke to pt about trying to do a 24 hour urine.  I did tell her that Dr. Janese Banks spoke to Dr. Juleen China and he said she makes urine and she should collect her urine on Sunday and rtn bottle on Monday.  He is agreeable and sometimes she does not urinate at all sometimes.  She will try to come and get bottle but she will not promise when it will be. I told her that I will put instructions in the bad for her and she can come anytime and get bottle for collection and it will have to be on ice or in refrigerator

## 2017-05-04 ENCOUNTER — Encounter: Payer: Self-pay | Admitting: Emergency Medicine

## 2017-05-04 ENCOUNTER — Emergency Department: Payer: Medicare Other

## 2017-05-04 ENCOUNTER — Observation Stay
Admission: EM | Admit: 2017-05-04 | Discharge: 2017-05-05 | Disposition: A | Discharge status: Home / Self Care | Payer: Medicare Other | Attending: Internal Medicine | Admitting: Internal Medicine

## 2017-05-04 ENCOUNTER — Other Ambulatory Visit: Payer: Self-pay

## 2017-05-04 DIAGNOSIS — I081 Rheumatic disorders of both mitral and tricuspid valves: Secondary | ICD-10-CM | POA: Insufficient documentation

## 2017-05-04 DIAGNOSIS — R0602 Shortness of breath: Secondary | ICD-10-CM | POA: Diagnosis present

## 2017-05-04 DIAGNOSIS — I5032 Chronic diastolic (congestive) heart failure: Secondary | ICD-10-CM | POA: Diagnosis not present

## 2017-05-04 DIAGNOSIS — Z888 Allergy status to other drugs, medicaments and biological substances status: Secondary | ICD-10-CM | POA: Insufficient documentation

## 2017-05-04 DIAGNOSIS — E872 Acidosis: Secondary | ICD-10-CM | POA: Insufficient documentation

## 2017-05-04 DIAGNOSIS — F329 Major depressive disorder, single episode, unspecified: Secondary | ICD-10-CM | POA: Insufficient documentation

## 2017-05-04 DIAGNOSIS — W19XXXA Unspecified fall, initial encounter: Secondary | ICD-10-CM | POA: Diagnosis not present

## 2017-05-04 DIAGNOSIS — K219 Gastro-esophageal reflux disease without esophagitis: Secondary | ICD-10-CM | POA: Diagnosis not present

## 2017-05-04 DIAGNOSIS — H9190 Unspecified hearing loss, unspecified ear: Secondary | ICD-10-CM | POA: Insufficient documentation

## 2017-05-04 DIAGNOSIS — S2232XA Fracture of one rib, left side, initial encounter for closed fracture: Secondary | ICD-10-CM | POA: Diagnosis not present

## 2017-05-04 DIAGNOSIS — J810 Acute pulmonary edema: Secondary | ICD-10-CM | POA: Diagnosis not present

## 2017-05-04 DIAGNOSIS — I132 Hypertensive heart and chronic kidney disease with heart failure and with stage 5 chronic kidney disease, or end stage renal disease: Secondary | ICD-10-CM | POA: Insufficient documentation

## 2017-05-04 DIAGNOSIS — Z7982 Long term (current) use of aspirin: Secondary | ICD-10-CM | POA: Insufficient documentation

## 2017-05-04 DIAGNOSIS — E8729 Other acidosis: Secondary | ICD-10-CM

## 2017-05-04 DIAGNOSIS — N186 End stage renal disease: Secondary | ICD-10-CM | POA: Insufficient documentation

## 2017-05-04 DIAGNOSIS — E7800 Pure hypercholesterolemia, unspecified: Secondary | ICD-10-CM | POA: Insufficient documentation

## 2017-05-04 DIAGNOSIS — I5033 Acute on chronic diastolic (congestive) heart failure: Secondary | ICD-10-CM | POA: Insufficient documentation

## 2017-05-04 DIAGNOSIS — F419 Anxiety disorder, unspecified: Secondary | ICD-10-CM | POA: Diagnosis not present

## 2017-05-04 DIAGNOSIS — E1122 Type 2 diabetes mellitus with diabetic chronic kidney disease: Secondary | ICD-10-CM | POA: Insufficient documentation

## 2017-05-04 DIAGNOSIS — Z91048 Other nonmedicinal substance allergy status: Secondary | ICD-10-CM | POA: Insufficient documentation

## 2017-05-04 DIAGNOSIS — Z992 Dependence on renal dialysis: Secondary | ICD-10-CM | POA: Insufficient documentation

## 2017-05-04 DIAGNOSIS — K861 Other chronic pancreatitis: Secondary | ICD-10-CM | POA: Insufficient documentation

## 2017-05-04 DIAGNOSIS — E1140 Type 2 diabetes mellitus with diabetic neuropathy, unspecified: Secondary | ICD-10-CM | POA: Diagnosis not present

## 2017-05-04 DIAGNOSIS — Z79899 Other long term (current) drug therapy: Secondary | ICD-10-CM | POA: Insufficient documentation

## 2017-05-04 DIAGNOSIS — Z8553 Personal history of malignant neoplasm of renal pelvis: Secondary | ICD-10-CM | POA: Diagnosis not present

## 2017-05-04 DIAGNOSIS — Z794 Long term (current) use of insulin: Secondary | ICD-10-CM | POA: Insufficient documentation

## 2017-05-04 DIAGNOSIS — N2581 Secondary hyperparathyroidism of renal origin: Secondary | ICD-10-CM | POA: Diagnosis not present

## 2017-05-04 DIAGNOSIS — Z885 Allergy status to narcotic agent status: Secondary | ICD-10-CM | POA: Insufficient documentation

## 2017-05-04 DIAGNOSIS — Z7902 Long term (current) use of antithrombotics/antiplatelets: Secondary | ICD-10-CM | POA: Insufficient documentation

## 2017-05-04 DIAGNOSIS — I252 Old myocardial infarction: Secondary | ICD-10-CM | POA: Insufficient documentation

## 2017-05-04 DIAGNOSIS — D631 Anemia in chronic kidney disease: Secondary | ICD-10-CM | POA: Insufficient documentation

## 2017-05-04 DIAGNOSIS — J45909 Unspecified asthma, uncomplicated: Secondary | ICD-10-CM | POA: Diagnosis not present

## 2017-05-04 DIAGNOSIS — S62102A Fracture of unspecified carpal bone, left wrist, initial encounter for closed fracture: Secondary | ICD-10-CM | POA: Diagnosis not present

## 2017-05-04 DIAGNOSIS — I2510 Atherosclerotic heart disease of native coronary artery without angina pectoris: Secondary | ICD-10-CM | POA: Diagnosis not present

## 2017-05-04 LAB — CBC
HCT: 37.4 % (ref 35.0–47.0)
Hemoglobin: 12.3 g/dL (ref 12.0–16.0)
MCH: 30.8 pg (ref 26.0–34.0)
MCHC: 32.9 g/dL (ref 32.0–36.0)
MCV: 93.6 fL (ref 80.0–100.0)
Platelets: 229 10*3/uL (ref 150–440)
RBC: 3.99 MIL/uL (ref 3.80–5.20)
RDW: 18 % — ABNORMAL HIGH (ref 11.5–14.5)
WBC: 6.3 10*3/uL (ref 3.6–11.0)

## 2017-05-04 LAB — BASIC METABOLIC PANEL
Anion gap: 16 — ABNORMAL HIGH (ref 5–15)
BUN: 29 mg/dL — ABNORMAL HIGH (ref 6–20)
CO2: 24 mmol/L (ref 22–32)
Calcium: 8.1 mg/dL — ABNORMAL LOW (ref 8.9–10.3)
Chloride: 93 mmol/L — ABNORMAL LOW (ref 101–111)
Creatinine, Ser: 6.94 mg/dL — ABNORMAL HIGH (ref 0.44–1.00)
GFR calc Af Amer: 7 mL/min — ABNORMAL LOW (ref >60)
GFR calc non Af Amer: 6 mL/min — ABNORMAL LOW (ref >60)
Glucose, Bld: 186 mg/dL — ABNORMAL HIGH (ref 65–99)
Potassium: 3.8 mmol/L (ref 3.5–5.1)
Sodium: 133 mmol/L — ABNORMAL LOW (ref 135–145)

## 2017-05-04 LAB — GLUCOSE, CAPILLARY: Glucose-Capillary: 180 mg/dL — ABNORMAL HIGH (ref 65–99)

## 2017-05-04 LAB — TROPONIN I: Troponin I: <0.03 ng/mL (ref <0.03)

## 2017-05-04 MED ORDER — SEVELAMER CARBONATE 800 MG PO TABS
800.0000 mg | ORAL_TABLET | Freq: Three times a day (TID) | ORAL | Status: DC
  Administered 2017-05-05 (×3): 800 mg via ORAL
  Filled 2017-05-04 (×3): qty 1

## 2017-05-04 MED ORDER — ALUM & MAG HYDROXIDE-SIMETH 200-200-20 MG/5ML PO SUSP
30.0000 mL | Freq: Once | ORAL | Status: AC
  Administered 2017-05-04: 30 mL via ORAL
  Filled 2017-05-04: qty 30

## 2017-05-04 MED ORDER — ACETAMINOPHEN 325 MG PO TABS: 650.0000 mg | ORAL_TABLET | Freq: Four times a day (QID) | ORAL | Status: DC | PRN

## 2017-05-04 MED ORDER — RIFAXIMIN 550 MG PO TABS
550.0000 mg | ORAL_TABLET | Freq: Two times a day (BID) | ORAL | Status: DC
  Administered 2017-05-04 – 2017-05-05 (×2): 550 mg via ORAL
  Filled 2017-05-04 (×3): qty 1

## 2017-05-04 MED ORDER — INSULIN ASPART 100 UNIT/ML ~~LOC~~ SOLN
0.0000 [IU] | Freq: Three times a day (TID) | SUBCUTANEOUS | Status: DC
Start: 2017-05-04 — End: 2017-05-05
  Administered 2017-05-05: 3 [IU] via SUBCUTANEOUS
  Filled 2017-05-04: qty 1

## 2017-05-04 MED ORDER — ALBUTEROL SULFATE HFA 108 (90 BASE) MCG/ACT IN AERS: 2.0000 | INHALATION_SPRAY | Freq: Four times a day (QID) | RESPIRATORY_TRACT | Status: DC | PRN

## 2017-05-04 MED ORDER — ACETAMINOPHEN 500 MG PO TABS
1000.0000 mg | ORAL_TABLET | Freq: Once | ORAL | Status: AC
  Administered 2017-05-04: 1000 mg via ORAL
  Filled 2017-05-04: qty 2

## 2017-05-04 MED ORDER — PANTOPRAZOLE SODIUM 40 MG PO TBEC
40.0000 mg | DELAYED_RELEASE_TABLET | Freq: Two times a day (BID) | ORAL | Status: DC
  Administered 2017-05-05 (×2): 40 mg via ORAL
  Filled 2017-05-04 (×2): qty 1

## 2017-05-04 MED ORDER — LORATADINE 10 MG PO TABS
10.0000 mg | ORAL_TABLET | Freq: Every day | ORAL | Status: DC
  Administered 2017-05-04 – 2017-05-05 (×2): 10 mg via ORAL
  Filled 2017-05-04 (×2): qty 1

## 2017-05-04 MED ORDER — HEPARIN SODIUM (PORCINE) 5000 UNIT/ML IJ SOLN
5000.0000 [IU] | Freq: Three times a day (TID) | INTRAMUSCULAR | Status: DC
  Administered 2017-05-04 – 2017-05-05 (×3): 5000 [IU] via SUBCUTANEOUS
  Filled 2017-05-04 (×3): qty 1

## 2017-05-04 MED ORDER — INSULIN ASPART 100 UNIT/ML ~~LOC~~ SOLN: 0.0000 [IU] | Freq: Every day | SUBCUTANEOUS | Status: DC

## 2017-05-04 MED ORDER — FAMOTIDINE 20 MG PO TABS
20.0000 mg | ORAL_TABLET | Freq: Two times a day (BID) | ORAL | Status: DC
  Administered 2017-05-04 – 2017-05-05 (×2): 20 mg via ORAL
  Filled 2017-05-04 (×2): qty 1

## 2017-05-04 MED ORDER — ONDANSETRON HCL 4 MG/2ML IJ SOLN: 4.0000 mg | Freq: Four times a day (QID) | INTRAMUSCULAR | Status: DC | PRN

## 2017-05-04 MED ORDER — RENA-VITE PO TABS
1.0000 | ORAL_TABLET | Freq: Every day | ORAL | Status: DC
  Administered 2017-05-04 – 2017-05-05 (×2): 1 via ORAL
  Filled 2017-05-04 (×2): qty 1

## 2017-05-04 MED ORDER — PROMETHAZINE HCL 25 MG/ML IJ SOLN: 25.0000 mg | Freq: Once | INTRAMUSCULAR | Status: DC

## 2017-05-04 MED ORDER — HYDROXYZINE HCL 25 MG PO TABS: 25.0000 mg | ORAL_TABLET | Freq: Every day | ORAL | Status: DC | PRN

## 2017-05-04 MED ORDER — DIPHENHYDRAMINE HCL 25 MG PO CAPS: 25.0000 mg | ORAL_CAPSULE | Freq: Four times a day (QID) | ORAL | Status: DC | PRN

## 2017-05-04 MED ORDER — FLUOXETINE HCL 10 MG PO CAPS
20.0000 mg | ORAL_CAPSULE | Freq: Every day | ORAL | Status: DC
  Administered 2017-05-04: 20 mg via ORAL
  Filled 2017-05-04: qty 2
  Filled 2017-05-04: qty 1

## 2017-05-04 MED ORDER — ASPIRIN EC 81 MG PO TBEC
81.0000 mg | DELAYED_RELEASE_TABLET | Freq: Every day | ORAL | Status: DC
  Administered 2017-05-04 – 2017-05-05 (×2): 81 mg via ORAL
  Filled 2017-05-04 (×2): qty 1

## 2017-05-04 MED ORDER — ALBUTEROL SULFATE (2.5 MG/3ML) 0.083% IN NEBU: 2.5000 mg | INHALATION_SOLUTION | Freq: Four times a day (QID) | RESPIRATORY_TRACT | Status: DC | PRN

## 2017-05-04 MED ORDER — AMLODIPINE BESYLATE 10 MG PO TABS
10.0000 mg | ORAL_TABLET | Freq: Every day | ORAL | Status: DC
  Administered 2017-05-04 – 2017-05-05 (×2): 10 mg via ORAL
  Filled 2017-05-04 (×2): qty 1

## 2017-05-04 MED ORDER — PREGABALIN 25 MG PO CAPS
25.0000 mg | ORAL_CAPSULE | Freq: Every day | ORAL | Status: DC
  Administered 2017-05-04 – 2017-05-05 (×2): 25 mg via ORAL
  Filled 2017-05-04 (×2): qty 1

## 2017-05-04 MED ORDER — NITROGLYCERIN 0.4 MG SL SUBL: 0.4000 mg | SUBLINGUAL_TABLET | SUBLINGUAL | Status: DC | PRN

## 2017-05-04 MED ORDER — ALUM & MAG HYDROXIDE-SIMETH 200-200-20 MG/5ML PO SUSP
30.0000 mL | Freq: Four times a day (QID) | ORAL | Status: DC | PRN
  Administered 2017-05-04 – 2017-05-05 (×3): 30 mL via ORAL
  Filled 2017-05-04 (×3): qty 30

## 2017-05-04 MED ORDER — PANCRELIPASE (LIP-PROT-AMYL) 12000-38000 UNITS PO CPEP
24000.0000 [IU] | ORAL_CAPSULE | Freq: Every day | ORAL | Status: DC
  Administered 2017-05-05: 24000 [IU] via ORAL
  Filled 2017-05-04: qty 2

## 2017-05-04 MED ORDER — RISAQUAD PO CAPS
1.0000 | ORAL_CAPSULE | Freq: Every day | ORAL | Status: DC
  Administered 2017-05-05: 1 via ORAL
  Filled 2017-05-04: qty 1

## 2017-05-04 MED ORDER — CLOPIDOGREL BISULFATE 75 MG PO TABS
75.0000 mg | ORAL_TABLET | Freq: Every day | ORAL | Status: DC
Start: 2017-05-04 — End: 2017-05-05
  Administered 2017-05-04 – 2017-05-05 (×2): 75 mg via ORAL
  Filled 2017-05-04 (×2): qty 1

## 2017-05-04 MED ORDER — ATORVASTATIN CALCIUM 20 MG PO TABS
20.0000 mg | ORAL_TABLET | Freq: Every day | ORAL | Status: DC
Start: 2017-05-04 — End: 2017-05-05
  Administered 2017-05-04: 20 mg via ORAL
  Filled 2017-05-04: qty 1

## 2017-05-04 MED ORDER — OMEGA-3-ACID ETHYL ESTERS 1 G PO CAPS
2.0000 g | ORAL_CAPSULE | Freq: Two times a day (BID) | ORAL | Status: DC
  Administered 2017-05-04 – 2017-05-05 (×2): 2 g via ORAL
  Filled 2017-05-04 (×2): qty 2

## 2017-05-04 MED ORDER — VITAMIN B-12 1000 MCG PO TABS
1000.0000 ug | ORAL_TABLET | Freq: Every day | ORAL | Status: DC
  Administered 2017-05-04 – 2017-05-05 (×2): 1000 ug via ORAL
  Filled 2017-05-04 (×2): qty 1

## 2017-05-04 MED ORDER — PANCRELIPASE (LIP-PROT-AMYL) 12000-38000 UNITS PO CPEP
36000.0000 [IU] | ORAL_CAPSULE | Freq: Two times a day (BID) | ORAL | Status: DC
  Administered 2017-05-05 (×2): 36000 [IU] via ORAL
  Filled 2017-05-04 (×2): qty 3

## 2017-05-04 MED ORDER — HYDROCODONE-ACETAMINOPHEN 5-325 MG PO TABS
1.0000 | ORAL_TABLET | Freq: Three times a day (TID) | ORAL | Status: DC | PRN
  Administered 2017-05-04 – 2017-05-05 (×2): 1 via ORAL
  Filled 2017-05-04 (×2): qty 1

## 2017-05-04 MED ORDER — HYDRALAZINE HCL 25 MG PO TABS
25.0000 mg | ORAL_TABLET | Freq: Three times a day (TID) | ORAL | Status: DC
  Administered 2017-05-04 – 2017-05-05 (×3): 25 mg via ORAL
  Filled 2017-05-04 (×3): qty 1

## 2017-05-04 MED ORDER — ONDANSETRON HCL 4 MG PO TABS: 4.0000 mg | ORAL_TABLET | Freq: Four times a day (QID) | ORAL | Status: DC | PRN

## 2017-05-04 MED ORDER — OXYCODONE HCL 5 MG PO TABS
5.0000 mg | ORAL_TABLET | Freq: Once | ORAL | Status: AC
  Administered 2017-05-04: 5 mg via ORAL
  Filled 2017-05-04: qty 1

## 2017-05-04 MED ORDER — PROMETHAZINE HCL 12.5 MG PO TABS
12.5000 mg | ORAL_TABLET | Freq: Four times a day (QID) | ORAL | Status: DC | PRN
  Filled 2017-05-04: qty 1

## 2017-05-04 MED ORDER — OLANZAPINE 5 MG PO TABS
5.0000 mg | ORAL_TABLET | Freq: Every day | ORAL | Status: DC
Start: 2017-05-04 — End: 2017-05-05
  Administered 2017-05-04: 5 mg via ORAL
  Filled 2017-05-04 (×2): qty 1

## 2017-05-04 MED ORDER — ACETAMINOPHEN 650 MG RE SUPP
650.0000 mg | Freq: Four times a day (QID) | RECTAL | Status: DC | PRN
Start: 2017-05-04 — End: 2017-05-05

## 2017-05-04 NOTE — ED Triage Notes (Signed)
Pt here from home via [facility] with c/o rib pain after a fall last month. Pt reports pain to left rib, pt is a dialysis patient, did not go to dialysis today.

## 2017-05-04 NOTE — Progress Notes (Signed)
Pre hd 

## 2017-05-04 NOTE — Progress Notes (Signed)
HD completed without issue. Patient tolerated well. Sats 100% on room air. Oxygen applied during treatment per patient request for comfort. No other complaints. Report called to floor. Will transport to room 220.

## 2017-05-04 NOTE — Progress Notes (Signed)
[facility] received a OR to talk with PT about an AD. [place] reported to PT's room and shared information and prayed with PT.    05/04/17 1930  Clinical Encounter Type  Visited With Patient  Visit Type Initial  Referral From Nurse  Consult/Referral To Chaplain  Spiritual Encounters  Spiritual Needs Brochure;Prayer

## 2017-05-04 NOTE — H&P (Signed)
[facility] [place] NAME: [name]    MR#:  [id number]  DATE OF BIRTH:  [birth year]  DATE OF ADMISSION:  05/04/2017  PRIMARY CARE PHYSICIAN: Ellamae Sia, MD   REQUESTING/REFERRING PHYSICIAN: Dr. Rudene Re  CHIEF COMPLAINT:   Chief Complaint  Patient presents with  . Chest Pain    HISTORY OF PRESENT ILLNESS:  [name]  is a 56 y.o. female with a known history of End-stage renal disease on hemodialysis, status post recent fall with a left broken wrist, chronic diastolic CHF, COPD, diabetes, diabetic neuropathy, depression, history of coronary artery disease, chronic pancreatitis who presents to the hospital due to chest pain. Patient says he developed chest pain earlier this morning which was located in the left side of her chest and it got worse when she took a deep breath. She also had associated nausea but no vomiting, diaphoresis palpitations or dizziness. She came to the ER for further evaluation. Emergency room patient's chest x-ray was consistent with pulmonary edema and some mild CHF. It also showed that she had a left seventh rib fracture. She has had no recent trauma other than the fall she had a few weeks back. Patient missed her hemodialysis spot as an outpatient as she presented to the ER due to chest pain and given the chest x-ray findings hospitalist services were contacted further treatment and evaluation.  PAST MEDICAL HISTORY:   Past Medical History:  Diagnosis Date  . Anemia   . Anginal pain ([place])   . Anxiety   . Arthritis   . Asthma   . Broken wrist   . Bronchitis   . chronic diastolic CHF 3/90/3009  . COPD (chronic obstructive pulmonary disease) ([place])   . Coronary artery disease    a. cath 2013: stenting to RCA (report not available); b. cath 2014: LM nl, pLAD 40%, mLAD nl, ost LCx 40%, mid LCx nl, pRCA 30% @ site of prior stent, mRCA 50%  . Depression   . Diabetes mellitus without complication ([place])   .  Diabetic neuropathy ([place])   . dialysis 2006  . Diverticulosis   . Dizziness   . Dyspnea   . Elevated lipids   . Environmental and seasonal allergies   . ESRD (end stage renal disease) on dialysis ([place])    M-W-F  . GERD (gastroesophageal reflux disease)   . Headache   . History of hiatal hernia   . HOH (hard of hearing)   . Hx of pancreatitis 2015  . Hypertension   . Lower extremity edema   . Mitral regurgitation    a. echo 10/2013: EF 62%, noWMA, mildly dilated LA, mild to mod MR/TR, GR1DD  . Myocardial infarction ([place])   . Orthopnea   . Pneumonia   . Renal cancer ([place])   . Renal insufficiency    Pt is on dialysis on M,W + F.  . Wheezing     PAST SURGICAL HISTORY:   Past Surgical History:  Procedure Laterality Date  . ABDOMINAL HYSTERECTOMY  1992  . APPENDECTOMY    . CARDIAC CATHETERIZATION Left 07/26/2015   Procedure: Left Heart Cath and Coronary Angiography;  Surgeon: Dionisio David, MD;  Location: [place] [facility];  Service: Cardiovascular;  Laterality: Left;  . CATARACT EXTRACTION W/ INTRAOCULAR LENS IMPLANT Right   . CATARACT EXTRACTION W/PHACO Left 03/10/2017   Procedure: CATARACT EXTRACTION PHACO AND INTRAOCULAR LENS PLACEMENT (IOC);  Surgeon: Birder Robson, MD;  Location: [facility];  Service: Ophthalmology;  Laterality: Left;  [place] 00:51.9 AP% 14.2 CDE 7.39 fluid pack lot # [id number]  . CHOLECYSTECTOMY    . COLONOSCOPY WITH PROPOFOL N/A 08/12/2016   Procedure: COLONOSCOPY WITH PROPOFOL;  Surgeon: Lollie Sails, MD;  Location: [facility] ENDOSCOPY;  Service: Endoscopy;  Laterality: N/A;  . DIALYSIS FISTULA CREATION Left    upper arm  . dialysis grafts    . ESOPHAGOGASTRODUODENOSCOPY N/A 03/08/2015   Procedure: ESOPHAGOGASTRODUODENOSCOPY (EGD);  Surgeon: Manya Silvas, MD;  Location[facility] ENDOSCOPY;  Service: Endoscopy;  Laterality: N/A;  . ESOPHAGOGASTRODUODENOSCOPY (EGD) WITH PROPOFOL N/A 03/18/2016   Procedure: ESOPHAGOGASTRODUODENOSCOPY (EGD) WITH PROPOFOL;   Surgeon: [name], MD;  Location: [facility] ENDOSCOPY;  Service: Endoscopy;  Laterality: N/A;  . EYE SURGERY Right 2018  . FECAL TRANSPLANT N/A 08/23/2015   Procedure: FECAL TRANSPLANT;  Surgeon: Manya Silvas, MD;  Location: [facility];  Service: Endoscopy;  Laterality: N/A;  . HAND SURGERY Bilateral   . PERIPHERAL VASCULAR CATHETERIZATION N/A 12/20/2015   Procedure: Thrombectomy of dialysis access versus permcath placement;  Surgeon: Algernon Huxley, MD;  Location: [place] [facility];  Service: Cardiovascular;  Laterality: N/A;  . PERIPHERAL VASCULAR CATHETERIZATION N/A 12/20/2015   Procedure: A/V Shunt Intervention;  Surgeon: Algernon Huxley, MD;  Location: [place] [facility];  Service: Cardiovascular;  Laterality: N/A;  . PERIPHERAL VASCULAR CATHETERIZATION N/A 12/20/2015   Procedure: A/V Shuntogram/Fistulagram;  Surgeon: Algernon Huxley, MD;  Location: [place] [facility];  Service: Cardiovascular;  Laterality: N/A;  . PERIPHERAL VASCULAR CATHETERIZATION N/A 01/02/2016   Procedure: A/V Shuntogram/Fistulagram;  Surgeon: Algernon Huxley, MD;  Location: [place] [facility];  Service: Cardiovascular;  Laterality: N/A;  . PERIPHERAL VASCULAR CATHETERIZATION N/A 01/02/2016   Procedure: A/V Shunt Intervention;  Surgeon: Algernon Huxley, MD;  Location: [place] [facility];  Service: Cardiovascular;  Laterality: N/A;    SOCIAL HISTORY:   Social History  Substance Use Topics  . Smoking status: Former Smoker    Packs/day: 0.50    Years: 40.00    Types: Cigarettes    Quit date: 02/13/2015  . Smokeless tobacco: Never Used  . Alcohol use 0.6 oz/week    1 Glasses of wine per week     Comment: glass wine week per pt    FAMILY HISTORY:   Family History  Problem Relation Age of Onset  . Kidney disease Mother   . Diabetes Mother   . Cancer Father   . Kidney disease Sister     DRUG ALLERGIES:   Allergies  Allergen Reactions  . Ace Inhibitors Swelling and Anaphylaxis  . Ativan [Lorazepam] Other (See  Comments)    Reaction:Hallucinations and headaches Reaction:Hallucinations and headaches Reaction:  Hallucinations and headaches  . Compazine [Prochlorperazine Edisylate] Anaphylaxis and Nausea And Vomiting    Other reaction(s): dystonia from this vs. reglan 23 Jul - patient relates that she takes promethazine frequently with no problems. 23 Jul - patient relates that she takes promethazine frequently with no problems.  . Sumatriptan Succinate     Other reaction(s): delirium and hallucinations per [facility] records  . Dilaudid [Hydromorphone Hcl] Other (See Comments)    Delirium   . Ondansetron Other (See Comments)    hallucinations    . Zofran [Ondansetron Hcl] Other (See Comments)    Reaction:  hallucinations  Reaction:  hallucinations   . Codeine Nausea And Vomiting  . Gabapentin Other (See Comments)    Reaction:  Unknown    . [place] Nausea  And Vomiting  . Losartan Nausea Only  . Oxycodone Anxiety  . Prochlorperazine Other (See Comments)    Reaction:  Unknown . Patient does not remember reaction but she does have vertigo and anxiety along with n and v at times. Could be used to treat any of these   . Reglan [Metoclopramide]     Per patient her Dr. Evelina Bucy her off it  Per patient her Dr. Evelina Bucy her off it   . Scopolamine Other (See Comments)    Dizziness, also has vertigo already  . Tape Rash  . Tapentadol Rash    REVIEW OF SYSTEMS:   Review of Systems  Constitutional: Negative for fever and weight loss.  HENT: Negative for congestion, nosebleeds and tinnitus.   Eyes: Negative for blurred vision, double vision and redness.  Respiratory: Positive for shortness of breath. Negative for cough and hemoptysis.   Cardiovascular: Positive for chest pain. Negative for orthopnea, leg swelling and PND.  Gastrointestinal: Negative for abdominal pain, diarrhea, melena, nausea and vomiting.  Genitourinary: Negative for dysuria, hematuria and urgency.  Musculoskeletal: Negative for  falls and joint pain.  Neurological: Negative for dizziness, tingling, sensory change, focal weakness, seizures, weakness and headaches.  Endo/Heme/Allergies: Negative for polydipsia. Does not bruise/bleed easily.  Psychiatric/Behavioral: Negative for depression and memory loss. The patient is not nervous/anxious.     MEDICATIONS AT HOME:   Prior to Admission medications   Medication Sig Start Date End Date Taking? Authorizing Provider  acidophilus (RISAQUAD) CAPS capsule Take 1 capsule by mouth daily with lunch.    Yes [provider]  albuterol (PROVENTIL HFA;VENTOLIN HFA) 108 (90 Base) MCG/ACT inhaler Inhale 2 puffs into the lungs every 6 (six) hours as needed for shortness of breath.    Yes [provider]  albuterol (PROVENTIL) (2.5 MG/3ML) 0.083% nebulizer solution Take 2.5 mg by nebulization every 6 (six) hours as needed for wheezing or shortness of breath.   Yes [provider]  alum & mag hydroxide-simeth (MAALOX/MYLANTA) 200-200-20 MG/5ML suspension Take 30 mLs by mouth every 6 (six) hours as needed for indigestion or heartburn. 03/25/17  Yes Dustin Flock, MD  amLODipine (NORVASC) 10 MG tablet Take 10 mg by mouth daily.   Yes [provider]  aspirin EC 81 MG tablet Take 81 mg by mouth daily. 09/08/16  Yes [provider]  atorvastatin (LIPITOR) 40 MG tablet Take 20 mg by mouth at bedtime.  12/02/16  Yes [provider]  cetirizine (ZYRTEC) 10 MG tablet Take 10 mg by mouth daily.    Yes [provider]  clopidogrel (PLAVIX) 75 MG tablet Take 1 tablet (75 mg total) by mouth daily. 03/25/17 03/25/18 Yes Dustin Flock, MD  cyanocobalamin 1000 MCG tablet Take 1,000 mcg by mouth daily with lunch.    Yes [provider]  FLUoxetine (PROZAC) 20 MG capsule Take 1 capsule (20 mg total) by mouth daily. Patient taking differently: Take 20 mg by mouth at bedtime.  04/10/16  Yes Hower, Aaron Mose, MD  glucose 4 GM chewable tablet  Chew 3-4 tablets by mouth as needed for low blood sugar (blood sugar less than 60).    Yes [provider]  hydrALAZINE (APRESOLINE) 25 MG tablet Take 25 mg by mouth 3 (three) times daily.   Yes [provider]  HYDROcodone-acetaminophen (NORCO/VICODIN) 5-325 MG tablet Take 1 tablet by mouth every 6 (six) hours as needed for moderate pain or severe pain. Patient taking differently: Take 1 tablet by mouth 3 (three) times  daily as needed (for severe pain.).  04/29/16  Yes Wieting, Richard, MD  hydrOXYzine (ATARAX/VISTARIL) 25 MG tablet Take 25 mg by mouth daily.   Yes [provider]  LEVEMIR FLEXTOUCH 100 UNIT/ML Pen Inject 4 Units into the skin 2 (two) times daily.  12/03/16  Yes [provider]  lipase/protease/amylase (CREON) 12000 UNITS CPEP capsule Take 24,000-36,000 Units by mouth 3 (three) times daily with meals. Take 36000 mg in the morning, 24000 midday & 36000 in the evening   Yes [provider]  meclizine (ANTIVERT) 25 MG tablet 25 mg   Yes [provider]  multivitamin (RENA-VIT) TABS tablet Take 1 tablet by mouth daily.    Yes [provider]  nitroGLYCERIN (NITROSTAT) 0.4 MG SL tablet Place 0.4 mg under the tongue every 5 (five) minutes x 3 doses as needed.   Yes [provider]  NOVOLOG FLEXPEN 100 UNIT/ML FlexPen Inject 4-12 Units into the skin 3 (three) times daily before meals. Per sliding scale 12/03/16  Yes [provider]  OLANZapine (ZYPREXA) 5 MG tablet Take 0.5 tablets (2.5 mg total) by mouth at bedtime. Patient taking differently: Take 5 mg by mouth at bedtime.  04/29/16  Yes Wieting, Richard, MD  omega-3 acid ethyl esters (LOVAZA) 1 g capsule Take 2 g by mouth 2 (two) times daily.    Yes [provider]  pantoprazole (PROTONIX) 40 MG tablet Take 1 tablet (40 mg total) by mouth 2 (two) times daily before a meal. 03/19/16  Yes Gladstone Lighter, MD  pregabalin (LYRICA) 25 MG capsule Take 25 mg  by mouth daily.   Yes [provider]  promethazine (PHENERGAN) 25 MG tablet Take 12.5 mg by mouth every 6 (six) hours as needed for nausea or vomiting.   Yes [provider]  ranitidine (ZANTAC) 150 MG tablet Take 150 mg by mouth 2 (two) times daily. In the afternoon   Yes [provider]  sevelamer carbonate (RENVELA) 800 MG tablet Take 800 mg by mouth 3 (three) times daily with meals.    Yes [provider]  diclofenac sodium (VOLTAREN) 1 % GEL Apply 2 g topically 2 (two) times daily as needed (for leg pain).     [provider]  diphenhydrAMINE (BENADRYL) 25 mg capsule Take 25 mg by mouth every 6 (six) hours as needed for itching.    [provider]  rifaximin (XIFAXAN) 550 MG TABS tablet Take 550 mg by mouth 2 (two) times daily.    [provider]      VITAL SIGNS:  Blood pressure (!) 178/74, pulse 67, temperature 98.6 F (37 C), temperature source Oral, resp. rate 16, height 5\' 5"  (1.651 m), weight 83.4 kg (183 lb 13.8 oz), SpO2 100 %.  PHYSICAL EXAMINATION:  Physical Exam  GENERAL:  56 y.o.-year-old patient lying in the bed getting HD in NAD.   EYES: Pupils equal, round, reactive to light and accommodation. No scleral icterus. Extraocular muscles intact.  HEENT: Head atraumatic, normocephalic. Oropharynx and nasopharynx clear. No oropharyngeal erythema, moist oral mucosa  NECK:  Supple, no jugular venous distention. No thyroid enlargement, no tenderness.  LUNGS: Normal breath sounds bilaterally, no wheezing, rales, rhonchi. No use of accessory muscles of respiration.  CARDIOVASCULAR: S1, S2 RRR. No murmurs, rubs, gallops, clicks.  ABDOMEN: Soft, nontender, nondistended. Bowel sounds present. No organomegaly or mass.  EXTREMITIES: No pedal edema, cyanosis, or clubbing. + 2 pedal & radial pulses b/l.   NEUROLOGIC: Cranial nerves II through XII are  intact. No focal Motor or sensory deficits appreciated b/l PSYCHIATRIC: The  patient is alert and oriented x 3.   SKIN: No obvious rash, lesion, or ulcer.   Left upper Ext. AV fistula with good bruit/thrill.   LABORATORY PANEL:   CBC  Recent Labs Lab 05/04/17 1122  WBC 6.3  HGB 12.3  HCT 37.4  PLT 229   ------------------------------------------------------------------------------------------------------------------  Chemistries   Recent Labs Lab 05/04/17 1122  NA 133*  K 3.8  CL 93*  CO2 24  GLUCOSE 186*  BUN 29*  CREATININE 6.94*  CALCIUM 8.1*   ------------------------------------------------------------------------------------------------------------------  Cardiac Enzymes  Recent Labs Lab 05/04/17 1122  TROPONINI <0.03   ------------------------------------------------------------------------------------------------------------------  RADIOLOGY:  Dg Chest 2 View  Result Date: 05/04/2017 CLINICAL DATA:  Status post fall last month with persistent left ribcage pain. History of asthma, COPD, former smoker, CHF, diabetes, dialysis dependent renal failure EXAM: CHEST  2 VIEW COMPARISON:  Portable chest x-ray of April 01, 2017 FINDINGS: The lungs are reasonably well inflated. The interstitial markings are increased. There small bilateral pleural effusions. The cardiac silhouette is enlarged. The pulmonary vascularity is engorged. Probable minimally displaced lateral left seventh rib fracture. The bony thorax exhibits no acute abnormality otherwise. IMPRESSION: CHF with mild interstitial edema and small bilateral pleural effusions. No acute pneumonia. There is a probable left lateral seventh rib fracture. Electronically Signed   By: David  Martinique M.D.   On: 05/04/2017 11:56     IMPRESSION AND PLAN:   [name]  is a 56 y.o. female with a known history of End-stage renal disease on hemodialysis, status post recent fall with a left broken wrist, chronic diastolic CHF, COPD, diabetes, diabetic neuropathy, depression, history of coronary artery  disease, chronic pancreatitis who presents to the hospital due to chest pain.  1. Chest pain-this was secondary to the left seventh rib fracture. -Supportive care with pain control, and patient's pain is improved with some oxycodone that she was given in the ER. -We'll give some incentive spirometry and continue supportive care for now.  2. CHF-acute on chronic diastolic CHF. Patient's chest x-ray findings on admission were consistent with pulmonary edema and some pleural effusions.  -patient has been taken to dialysis urgently today as she missed her outpatient spot. Continue dialysis as per nephrology, follow clinically. -Continue hydralazine, Norvasc.  3. End-stage renal disease on hemodialysis-patient gets dialysis on Monday Wednesday and Friday. -Nephrology has been consulted. Patient getting hemodialysis presently. -Continue further care as per nephrology.  4. History of chronic pancreatitis-continue pancreatic lipase supplements.  5. Depression-continue Prozac.  6. GERD-continue Protonix.  7. Diabetic neuropathy-continue Lyrica.  8. Secondary hyperparathyroidism-continue Renvela.  All the records are reviewed and case discussed with ED provider. Management plans discussed with the patient, family and they are in agreement.  CODE STATUS: Full code  TOTAL TIME TAKING CARE OF THIS PATIENT: 45 minutes.    Henreitta Leber M.D on 05/04/2017 at 4:40 PM  Between 7am to 6pm - Pager - [phone]  After 6pm go to [email or web address] - password [email or web address] [facility]  Office  [phone]  CC: Primary care physician; Ellamae Sia, MD

## 2017-05-04 NOTE — Progress Notes (Signed)
Post hd assessment unchanged  

## 2017-05-04 NOTE — Progress Notes (Signed)
Chaplain rounding unit visited with pt. Pt was lying on the bed at the time of this visit. No family member was present. Pt stated she had talked to family members on phone. Pt indicates she is in pain and that dc's were still evaluating health status. Upon request for prayer, CH offered prayers for pt to help her cope with pain. Flasher to follow up pt as needed.    05/04/17 1400  Clinical Encounter Type  Visited With Patient;Health care provider  Visit Type Initial;Spiritual support  Referral From [place];Other (Comment)

## 2017-05-04 NOTE — ED Triage Notes (Signed)
Pt reports right and  central chest pain that worsens with a deep breath.

## 2017-05-04 NOTE — Progress Notes (Signed)
Hemodialysis- Patient arrived via stretcher from ED. Denies shortness of breath. With some pain in side. Medicated prior to arrival to HD per ED RN. Consents obtained and HD initiated without issue via L AVG. Continue to monitor

## 2017-05-04 NOTE — ED Provider Notes (Signed)
[facility] Emergency Department Provider Note  ____________________________________________  Time seen: Approximately 1:31 PM  I have reviewed the triage vital signs and the nursing notes.   HISTORY  Chief Complaint Chest Pain   HPI [name] is a 56 y.o. female with a history of ESRD on HD (MWF), COPD, CAD, CHF, diabetes, hypertension who presents for evaluation of nausea, vomiting and chest pain. Patient reports that she started to have nausea yesterday evening. She had several episodes of nonbloody nonbilious emesis overnight. This morning she developed sudden onset of severe left-sided chest pain that is worse with deep inspiration or palpation of the lateral left chest wall. The pain has been constant, severe, and nonradiating. She called her dialysis center who told her to come to the emergency room. Her last dialysis was on Friday. She denies shortness of breath, fever, chills, abdominal pain, diarrhea. Patient had a fall a month ago causing a L wrist fracture. No other falls that she remembers.   Past Medical History:  Diagnosis Date  . Anemia   . Anginal pain ([place])   . Anxiety   . Arthritis   . Asthma   . Broken wrist   . Bronchitis   . chronic diastolic CHF 03/22/7792  . COPD (chronic obstructive pulmonary disease) ([place])   . Coronary artery disease    a. cath 2013: stenting to RCA (report not available); b. cath 2014: LM nl, pLAD 40%, mLAD nl, ost LCx 40%, mid LCx nl, pRCA 30% @ site of prior stent, mRCA 50%  . Depression   . Diabetes mellitus without complication ([place])   . Diabetic neuropathy ([place])   . dialysis 2006  . Diverticulosis   . Dizziness   . Dyspnea   . Elevated lipids   . Environmental and seasonal allergies   . ESRD (end stage renal disease) on dialysis ([place])    M-W-F  . GERD (gastroesophageal reflux disease)   . Headache   . History of hiatal hernia   . HOH (hard of hearing)   . Hx of pancreatitis 2015  . Hypertension    . Lower extremity edema   . Mitral regurgitation    a. echo 10/2013: EF 62%, noWMA, mildly dilated LA, mild to mod MR/TR, GR1DD  . Myocardial infarction ([place])   . Orthopnea   . Pneumonia   . Renal cancer ([place])   . Renal insufficiency    Pt is on dialysis on M,W + F.  . Wheezing     Patient Active Problem List   Diagnosis Date Noted  . Cellulitis of lower extremity 07/29/2016  . Chronic venous insufficiency 07/29/2016  . Lymphedema 07/29/2016  . TIA (transient ischemic attack) 04/21/2016  . Altered mental status 04/08/2016  . Hyperammonemia ([place]) 04/08/2016  . Elevated troponin 04/08/2016  . ESRD (end stage renal disease) ([place]) 04/08/2016  . Depression 04/08/2016  . Depression, major, recurrent, severe with psychosis ([place]) 04/08/2016  . Blood in stool   . Intractable cyclical vomiting with nausea   . Reflux esophagitis   . Gastritis   . Generalized abdominal pain   . Uncontrollable vomiting   . Major depressive disorder, recurrent episode, moderate ([place]) 03/15/2016  . Adjustment disorder with mixed anxiety and depressed mood 03/15/2016  . Malnutrition of moderate degree 12/01/2015  . Renal mass   . Dyspnea   . Acute renal failure ([employer])   . Respiratory failure ([place])   . High temperature 11/14/2015  . Pulmonary edema   . Encounter  for central line placement   . Encounter for orogastric (OG) tube placement   . Nausea 11/12/2015  . Hyperkalemia 10/03/2015  . Diarrhea, unspecified 07/22/2015  . Pneumonia 05/21/2015  . Hypoglycemia 04/24/2015  . Unresponsiveness 04/24/2015  . Bradycardia 04/24/2015  . Hypothermia 04/24/2015  . Acute respiratory failure ([place]) 04/24/2015  . Acute diastolic CHF (congestive heart failure) ([place]) 04/05/2015  . Diabetic gastroparesis ([place]) 04/05/2015  . Hypokalemia 04/05/2015  . Generalized weakness 04/05/2015  . Acute pulmonary edema ([place]) 04/03/2015  . Nausea and vomiting 04/03/2015  . Hypoglycemia associated with diabetes ([place])  04/03/2015  . Anemia of chronic disease 04/03/2015  . Secondary hyperparathyroidism ([place]) 04/03/2015  . Pressure ulcer 04/02/2015  . Acute respiratory failure with hypoxia ([place]) 04/01/2015  . Adjustment disorder with anxiety 03/14/2015  . Somatic symptom disorder, mild 03/08/2015  . Coronary artery disease involving native coronary artery of native heart without angina pectoris   . Nausea & vomiting 03/06/2015  . Abdominal pain 03/06/2015  . DM (diabetes mellitus) ([place]) 03/06/2015  . HTN (hypertension) 03/06/2015  . Gastroparesis 02/24/2015  . Pleural effusion 02/19/2015  . HCAP (healthcare-associated pneumonia) 02/19/2015  . End-stage renal disease on hemodialysis ([place]) 02/19/2015    Past Surgical History:  Procedure Laterality Date  . ABDOMINAL HYSTERECTOMY  1992  . APPENDECTOMY    . CARDIAC CATHETERIZATION Left 07/26/2015   Procedure: Left Heart Cath and Coronary Angiography;  Surgeon: Dionisio David, MD;  Location: [place] [facility];  Service: Cardiovascular;  Laterality: Left;  . CATARACT EXTRACTION W/ INTRAOCULAR LENS IMPLANT Right   . CATARACT EXTRACTION W/PHACO Left 03/10/2017   Procedure: CATARACT EXTRACTION PHACO AND INTRAOCULAR LENS PLACEMENT (IOC);  Surgeon: Birder Robson, MD;  Location: [facility] ORS;  Service: Ophthalmology;  Laterality: Left;  [place] 00:51.9 AP% 14.2 CDE 7.39 fluid pack lot # [id number]  . CHOLECYSTECTOMY    . COLONOSCOPY WITH PROPOFOL N/A 08/12/2016   Procedure: COLONOSCOPY WITH PROPOFOL;  Surgeon: Lollie Sails, MD;  Location: [facility] ENDOSCOPY;  Service: Endoscopy;  Laterality: N/A;  . DIALYSIS FISTULA CREATION Left    upper arm  . dialysis grafts    . ESOPHAGOGASTRODUODENOSCOPY N/A 03/08/2015   Procedure: ESOPHAGOGASTRODUODENOSCOPY (EGD);  Surgeon: Manya Silvas, MD;  Location: [facility] ENDOSCOPY;  Service: Endoscopy;  Laterality: N/A;  . ESOPHAGOGASTRODUODENOSCOPY (EGD) WITH PROPOFOL N/A 03/18/2016   Procedure: ESOPHAGOGASTRODUODENOSCOPY (EGD) WITH  PROPOFOL;  Surgeon: [name], MD;  Location: [facility] ENDOSCOPY;  Service: Endoscopy;  Laterality: N/A;  . EYE SURGERY Right 2018  . FECAL TRANSPLANT N/A 08/23/2015   Procedure: FECAL TRANSPLANT;  Surgeon: Manya Silvas, MD;  Location: [facility] ENDOSCOPY;  Service: Endoscopy;  Laterality: N/A;  . HAND SURGERY Bilateral   . PERIPHERAL VASCULAR CATHETERIZATION N/A 12/20/2015   Procedure: Thrombectomy of dialysis access versus permcath placement;  Surgeon: Algernon Huxley, MD;  Location: [place] CV LAB;  Service: Cardiovascular;  Laterality: N/A;  . PERIPHERAL VASCULAR CATHETERIZATION N/A 12/20/2015   Procedure: A/V Shunt Intervention;  Surgeon: Algernon Huxley, MD;  Location: [place] CV LAB;  Service: Cardiovascular;  Laterality: N/A;  . PERIPHERAL VASCULAR CATHETERIZATION N/A 12/20/2015   Procedure: A/V Shuntogram/Fistulagram;  Surgeon: Algernon Huxley, MD;  Location: [place] [facility] LAB;  Service: Cardiovascular;  Laterality: N/A;  . PERIPHERAL VASCULAR CATHETERIZATION N/A 01/02/2016   Procedure: A/V Shuntogram/Fistulagram;  Surgeon: Algernon Huxley, MD;  Location: [place] CV LAB;  Service: Cardiovascular;  Laterality: N/A;  . PERIPHERAL VASCULAR CATHETERIZATION N/A 01/02/2016   Procedure: A/V Shunt Intervention;  Surgeon: Algernon Huxley, MD;  Location: [place] [facility];  Service: Cardiovascular;  Laterality: N/A;    Prior to Admission medications   Medication Sig Start Date End Date Taking? Authorizing Provider  acidophilus (RISAQUAD) CAPS capsule Take 1 capsule by mouth daily with lunch.    Yes [provider]  albuterol (PROVENTIL HFA;VENTOLIN HFA) 108 (90 Base) MCG/ACT inhaler Inhale 2 puffs into the lungs every 6 (six) hours as needed for shortness of breath.    Yes [provider]  albuterol (PROVENTIL) (2.5 MG/3ML) 0.083% nebulizer solution Take 2.5 mg by nebulization every 6 (six) hours as needed for wheezing or shortness of breath.   Yes [provider]  alum &  mag hydroxide-simeth (MAALOX/MYLANTA) 200-200-20 MG/5ML suspension Take 30 mLs by mouth every 6 (six) hours as needed for indigestion or heartburn. 03/25/17  Yes Dustin Flock, MD  amLODipine (NORVASC) 10 MG tablet Take 10 mg by mouth daily.   Yes [provider]  aspirin EC 81 MG tablet Take 81 mg by mouth daily. 09/08/16  Yes [provider]  atorvastatin (LIPITOR) 40 MG tablet Take 20 mg by mouth at bedtime.  12/02/16  Yes [provider]  cetirizine (ZYRTEC) 10 MG tablet Take 10 mg by mouth daily.    Yes [provider]  clopidogrel (PLAVIX) 75 MG tablet Take 1 tablet (75 mg total) by mouth daily. 03/25/17 03/25/18 Yes Dustin Flock, MD  cyanocobalamin 1000 MCG tablet Take 1,000 mcg by mouth daily with lunch.    Yes [provider]  FLUoxetine (PROZAC) 20 MG capsule Take 1 capsule (20 mg total) by mouth daily. Patient taking differently: Take 20 mg by mouth at bedtime.  04/10/16  Yes Hower, Aaron Mose, MD  glucose 4 GM chewable tablet Chew 3-4 tablets by mouth as needed for low blood sugar (blood sugar less than 60).    Yes [provider]  hydrALAZINE (APRESOLINE) 25 MG tablet Take 25 mg by mouth 3 (three) times daily.   Yes [provider]  HYDROcodone-acetaminophen (NORCO/VICODIN) 5-325 MG tablet Take 1 tablet by mouth every 6 (six) hours as needed for moderate pain or severe pain. Patient taking differently: Take 1 tablet by mouth 3 (three) times daily as needed (for severe pain.).  04/29/16  Yes Wieting, Richard, MD  hydrOXYzine (ATARAX/VISTARIL) 25 MG tablet Take 25 mg by mouth daily.   Yes [provider]  LEVEMIR FLEXTOUCH 100 UNIT/ML Pen Inject 4 Units into the skin 2 (two) times daily.  12/03/16  Yes [provider]  lipase/protease/amylase (CREON) 12000 UNITS CPEP capsule Take 24,000-36,000 Units by mouth 3 (three) times daily with meals. Take 36000 mg in the morning, 24000 midday & 36000 in the evening   Yes  [provider]  meclizine (ANTIVERT) 25 MG tablet 25 mg   Yes [provider]  multivitamin (RENA-VIT) TABS tablet Take 1 tablet by mouth daily.    Yes [provider]  nitroGLYCERIN (NITROSTAT) 0.4 MG SL tablet Place 0.4 mg under the tongue every 5 (five) minutes x 3 doses as needed.   Yes [provider]  NOVOLOG FLEXPEN 100 UNIT/ML FlexPen Inject 4-12 Units into the skin 3 (three) times daily before meals. Per sliding scale 12/03/16  Yes [provider]  OLANZapine (ZYPREXA) 5 MG tablet Take 0.5 tablets (2.5 mg total) by mouth at bedtime. Patient taking differently: Take 5 mg by mouth at bedtime.  04/29/16  Yes Loletha Grayer, MD  omega-3 acid ethyl esters (  LOVAZA) 1 g capsule Take 2 g by mouth 2 (two) times daily.    Yes [provider]  pantoprazole (PROTONIX) 40 MG tablet Take 1 tablet (40 mg total) by mouth 2 (two) times daily before a meal. 03/19/16  Yes Gladstone Lighter, MD  pregabalin (LYRICA) 25 MG capsule Take 25 mg by mouth daily.   Yes [provider]  promethazine (PHENERGAN) 25 MG tablet Take 12.5 mg by mouth every 6 (six) hours as needed for nausea or vomiting.   Yes [provider]  ranitidine (ZANTAC) 150 MG tablet Take 150 mg by mouth 2 (two) times daily. In the afternoon   Yes [provider]  sevelamer carbonate (RENVELA) 800 MG tablet Take 800 mg by mouth 3 (three) times daily with meals.    Yes [provider]  diclofenac sodium (VOLTAREN) 1 % GEL Apply 2 g topically 2 (two) times daily as needed (for leg pain).     [provider]  diphenhydrAMINE (BENADRYL) 25 mg capsule Take 25 mg by mouth every 6 (six) hours as needed for itching.    [provider]  rifaximin (XIFAXAN) 550 MG TABS tablet Take 550 mg by mouth 2 (two) times daily.    [provider]    Allergies Ace inhibitors; Ativan [lorazepam]; Compazine [prochlorperazine edisylate]; Sumatriptan  succinate; Dilaudid [hydromorphone hcl]; Ondansetron; Zofran [ondansetron hcl]; Codeine; Gabapentin; Lac bovis; Losartan; Oxycodone; Prochlorperazine; Reglan [metoclopramide]; Scopolamine; Tape; and Tapentadol  Family History  Problem Relation Age of Onset  . Kidney disease Mother   . Diabetes Mother   . Cancer Father   . Kidney disease Sister     Social History Social History  Substance Use Topics  . Smoking status: Former Smoker    Packs/day: 0.50    Types: Cigarettes    Quit date: 02/13/2015  . Smokeless tobacco: Never Used  . Alcohol use 0.6 oz/week    1 Glasses of wine per week     Comment: glass wine week per pt    Review of Systems  Constitutional: Negative for fever. Eyes: Negative for visual changes. ENT: Negative for sore throat. Neck: No neck pain  Cardiovascular: Negative for chest pain. Respiratory: Negative for shortness of breath. Gastrointestinal: Negative for abdominal pain,  Diarrhea. + N/V Genitourinary: Negative for dysuria. Musculoskeletal: Negative for back pain. Skin: Negative for rash. Neurological: Negative for headaches, weakness or numbness. Psych: No SI or HI  ____________________________________________   PHYSICAL EXAM:  VITAL SIGNS: ED Triage Vitals [05/04/17 1112]  Enc Vitals Group     BP (!) 163/60     Pulse Rate 74     Resp 20     Temp      Temp src      SpO2 100 %     Weight 184 lb (83.5 kg)     Height 5\' 5"  (1.651 m)     Head Circumference      Peak Flow      Pain Score 10     Pain Loc      Pain Edu?      Excl. in [place]?     Constitutional: Alert and oriented, patient significant distress due to pain HEENT:      Head: Normocephalic and atraumatic.         Eyes: Conjunctivae are normal. Sclera is non-icteric.       Mouth/Throat: Mucous membranes are moist.       Neck: Supple with no signs of meningismus. Cardiovascular: Regular rate and  rhythm. 2+ symmetrical distal pulses are present in all extremities. No  JVD. Respiratory: Normal respiratory effort. Lungs are clear to auscultation bilaterally. No wheezes, crackles, or rhonchi. Patient has significant tenderness to palpation to the left lateral chest wall with no bruises or abrasions Gastrointestinal: Soft, non tender, and non distended with positive bowel sounds. No rebound or guarding. Musculoskeletal: Nontender with normal range of motion in all extremities. No edema, cyanosis, or erythema of extremities. Neurologic: Normal speech and language. Face is symmetric. Moving all extremities. No gross focal neurologic deficits are appreciated. Skin: Skin is warm, dry and intact. No rash noted. Psychiatric: Mood and affect are normal. Speech and behavior are normal.  ____________________________________________   LABS (all labs ordered are listed, but only abnormal results are displayed)  Labs Reviewed  BASIC METABOLIC PANEL - Abnormal; Notable for the following:       Result Value   Sodium 133 (*)    Chloride 93 (*)    Glucose, Bld 186 (*)    BUN 29 (*)    Creatinine, Ser 6.94 (*)    Calcium 8.1 (*)    GFR calc non Af Amer 6 (*)    GFR calc Af Amer 7 (*)    Anion gap 16 (*)    All other components within normal limits  CBC - Abnormal; Notable for the following:    RDW 18.0 (*)    All other components within normal limits  TROPONIN I   ____________________________________________  EKG  ED ECG REPORT I, Rudene Re, the attending physician, personally viewed and interpreted this ECG.  Normal sinus rhythm, rate of 70, normal PR and QRS intervals, prolonged QTC, normal axis, diffuse T-wave flattening  ____________________________________________  RADIOLOGY  CXR: CHF with mild interstitial edema and small bilateral pleural effusions. No acute pneumonia. There is a probable left lateral seventh rib fracture. ____________________________________________   PROCEDURES  Procedure(s) performed: None Procedures Critical Care  performed:  None ____________________________________________   INITIAL IMPRESSION / ASSESSMENT AND PLAN / ED COURSE  56 y.o. female with a history of ESRD on HD (MWF), COPD, CAD, CHF, diabetes, hypertension who presents for evaluation of nausea, vomiting and chest pain. Patient tells me she has a history of cyclic nausea and vomiting. Has tried her home meds with no success. She has several allergies to different anti-emetics. Patient denies fall and has significant tenderness to palpation on the left lateral chest wall where chest x-rays sees a left seventh rib fracture. No infiltrate concerning for PNA but mild interstitial edema with small bilateral pleural effusions seen. We'll give Tylenol and oxycodone for pain. Will start patient on incentive spirometer. Labs with no hyperkalemia, normal bicarbonate with elevated anion gap. EKG and troponin with no ischemic changes. Will consult nephrology for possible HD here vs outpatient later today once pain is well controlled.     _________________________ 1:50 PM on 05/04/2017 -----------------------------------------  Discussed the patient with Dr. Juleen China, patient's nephrologist recommended admission for dialysis since patient is known to have flash pulmonary edema and missing dialysis in the past and CXR is concerning for pulmonary edema.   Pertinent labs & imaging results that were available during my care of the patient were reviewed by me and considered in my medical decision making (see chart for details).    ____________________________________________   FINAL CLINICAL IMPRESSION(S) / ED DIAGNOSES  Final diagnoses:  Closed fracture of one rib of left side, initial encounter  Acute pulmonary edema (HCC)  Metabolic acidosis, increased anion gap (IAG)  NEW MEDICATIONS STARTED DURING THIS VISIT:  New Prescriptions   No medications on file     Note:  This document was prepared using Dragon voice recognition software and may  include unintentional dictation errors.    Alfred Levins, [place], MD 05/04/17 1351

## 2017-05-04 NOTE — Progress Notes (Signed)
[facility]  ROUNDING NOTE   Subjective:   [name] admitted to [facility] on 05/04/2017 for rib pain   Seen and examined on hemodialysis. Tolerating treatment well. UF goal of 1.5 liters.     HEMODIALYSIS FLOWSHEET:  Blood Flow Rate (mL/min): 400 mL/min Arterial Pressure (mmHg): -170 mmHg Venous Pressure (mmHg): 210 mmHg Transmembrane Pressure (mmHg): 40 mmHg Ultrafiltration Rate (mL/min): 670 mL/min Dialysate Flow Rate (mL/min): 600 ml/min Conductivity: Machine : 14.2 Conductivity: Machine : 14.2 Dialysis Fluid Bolus: Normal Saline Bolus Amount (mL): 250 mL Dialysate Change:  (3k 2.5ca)   Objective:  Vital signs in last 24 hours:  Temp:  [98.6 F (37 C)] 98.6 F (37 C) (08/13 1450) Pulse Rate:  [67-74] 67 (08/13 1630) Resp:  [12-21] 16 (08/13 1630) BP: (126-192)/(60-122) 178/74 (08/13 1630) SpO2:  [100 %] 100 % (08/13 1630) Weight:  [83.4 kg (183 lb 13.8 oz)-83.5 kg (184 lb)] 83.4 kg (183 lb 13.8 oz) (08/13 1450)  Weight change:  Filed Weights   05/04/17 1112 05/04/17 1450  Weight: 83.5 kg (184 lb) 83.4 kg (183 lb 13.8 oz)    Intake/Output: No intake/output data recorded.   Intake/Output this shift:  No intake/output data recorded.  Physical Exam: General: NAD, laying in bed  Head: Normocephalic, atraumatic. Moist oral mucosal membranes  Eyes: Anicteric, PERRL  Neck: Supple, trachea midline  Lungs:  Clear to auscultation  Heart: Regular rate and rhythm  Abdomen:  Soft, nontender, obese  Extremities: no peripheral edema. Left arm in brace  Neurologic: Nonfocal, moving all four extremities  Skin: No lesions  Access: Left arm AVF    Basic Metabolic Panel:  Recent Labs Lab 05/04/17 1122  NA 133*  K 3.8  CL 93*  CO2 24  GLUCOSE 186*  BUN 29*  CREATININE 6.94*  CALCIUM 8.1*    Liver Function Tests: No results for input(s): AST, ALT, ALKPHOS, BILITOT, PROT, ALBUMIN in the last 168 hours. No results for input(s): LIPASE, AMYLASE  in the last 168 hours. No results for input(s): AMMONIA in the last 168 hours.  CBC:  Recent Labs Lab 05/04/17 1122  WBC 6.3  HGB 12.3  HCT 37.4  MCV 93.6  PLT 229    Cardiac Enzymes:  Recent Labs Lab 05/04/17 1122  TROPONINI <0.03    BNP: Invalid input(s): POCBNP  CBG: No results for input(s): GLUCAP in the last 168 hours.  Microbiology: Results for orders placed or performed during the hospital encounter of 03/23/17  MRSA PCR Screening     Status: None   Collection Time: 03/23/17  4:38 PM  Result Value Ref Range Status   MRSA by PCR NEGATIVE NEGATIVE Final    Comment:        The GeneXpert MRSA Assay (FDA approved for NASAL specimens only), is one component of a comprehensive MRSA colonization surveillance program. It is not intended to diagnose MRSA infection nor to guide or monitor treatment for MRSA infections.     Coagulation Studies: No results for input(s): LABPROT, INR in the last 72 hours.  Urinalysis: No results for input(s): COLORURINE, LABSPEC, PHURINE, GLUCOSEU, HGBUR, BILIRUBINUR, KETONESUR, PROTEINUR, UROBILINOGEN, NITRITE, LEUKOCYTESUR in the last 72 hours.  Invalid input(s): APPERANCEUR    Imaging: Dg Chest 2 View  Result Date: 05/04/2017 CLINICAL DATA:  Status post fall last month with persistent left ribcage pain. History of asthma, COPD, former smoker, CHF, diabetes, dialysis dependent renal failure EXAM: CHEST  2 VIEW COMPARISON:  Portable chest x-ray of April 01, 2017 FINDINGS: The lungs are reasonably well inflated. The interstitial markings are increased. There small bilateral pleural effusions. The cardiac silhouette is enlarged. The pulmonary vascularity is engorged. Probable minimally displaced lateral left seventh rib fracture. The bony thorax exhibits no acute abnormality otherwise. IMPRESSION: CHF with mild interstitial edema and small bilateral pleural effusions. No acute pneumonia. There is a probable left lateral seventh rib  fracture. Electronically Signed   By: David  Martinique M.D.   On: 05/04/2017 11:56     Medications:    . insulin aspart  0-5 Units Subcutaneous QHS  . insulin aspart  0-9 Units Subcutaneous TID WC  . promethazine  25 mg Intravenous Once     Assessment/ Plan:  [name] is a 56 y.o. black female with diabetes, hypertension, hyperlipidemia, diabetic retinopathy, diabetic neuropathy, diabetic gastroparesis, pancreatic insufficiency, depression, GERD. Renal masses  [facility] MWF [address]   1. End Stage Renal Disease: Seen and examined on hemodialysis. Tolerating treatment well. No issues with AVG Continue MWF schedule.   2. Hypertension: elevated on treatment.  - Restart home blood pressure regimen after dialysis.   3. Anemia of chronic kidney disease: hemoglobin 12.3 - Mircera as outpatient.   4. Secondary Hyperparathyroidism: Outpatient calcium and phosphorus at goal.  - Sevelamer with meals.    LOS: 0 Giovanni Biby 8/13/20184:38 PM

## 2017-05-04 NOTE — ED Notes (Signed)
Call bell answered, pt helped to reposition.

## 2017-05-05 ENCOUNTER — Observation Stay: Payer: Medicare Other

## 2017-05-05 DIAGNOSIS — J810 Acute pulmonary edema: Secondary | ICD-10-CM | POA: Diagnosis not present

## 2017-05-05 LAB — HEPATITIS B SURFACE ANTIBODY, QUANTITATIVE: Hepatitis B-Post: >1000 m[IU]/mL (ref >9.9)

## 2017-05-05 LAB — CBC
HCT: 39.9 % (ref 35.0–47.0)
Hemoglobin: 12.9 g/dL (ref 12.0–16.0)
MCH: 30.5 pg (ref 26.0–34.0)
MCHC: 32.2 g/dL (ref 32.0–36.0)
MCV: 94.7 fL (ref 80.0–100.0)
Platelets: 201 10*3/uL (ref 150–440)
RBC: 4.22 MIL/uL (ref 3.80–5.20)
RDW: 18.6 % — ABNORMAL HIGH (ref 11.5–14.5)
WBC: 5.1 10*3/uL (ref 3.6–11.0)

## 2017-05-05 LAB — GLUCOSE, CAPILLARY
Glucose-Capillary: 91 mg/dL (ref 65–99)
Glucose-Capillary: 237 mg/dL — ABNORMAL HIGH (ref 65–99)
Glucose-Capillary: 113 mg/dL — ABNORMAL HIGH (ref 65–99)
Glucose-Capillary: 97 mg/dL (ref 65–99)

## 2017-05-05 LAB — BASIC METABOLIC PANEL
Anion gap: 9 (ref 5–15)
BUN: 14 mg/dL (ref 6–20)
CO2: 29 mmol/L (ref 22–32)
Calcium: 8.1 mg/dL — ABNORMAL LOW (ref 8.9–10.3)
Chloride: 97 mmol/L — ABNORMAL LOW (ref 101–111)
Creatinine, Ser: 4.6 mg/dL — ABNORMAL HIGH (ref 0.44–1.00)
GFR calc Af Amer: 11 mL/min — ABNORMAL LOW (ref >60)
GFR calc non Af Amer: 10 mL/min — ABNORMAL LOW (ref >60)
Glucose, Bld: 141 mg/dL — ABNORMAL HIGH (ref 65–99)
Potassium: 4.1 mmol/L (ref 3.5–5.1)
Sodium: 135 mmol/L (ref 135–145)

## 2017-05-05 LAB — HEPATITIS B SURFACE ANTIGEN: Hepatitis B Surface Ag: NEGATIVE

## 2017-05-05 LAB — MRSA PCR SCREENING: MRSA by PCR: NEGATIVE

## 2017-05-05 MED ORDER — HYDROCODONE-ACETAMINOPHEN 5-325 MG PO TABS: 1.0000 | ORAL_TABLET | Freq: Four times a day (QID) | ORAL | 0 refills | Status: DC | PRN

## 2017-05-05 NOTE — Care Management Obs Status (Signed)
[place] NOTIFICATION   Patient Details  Name: [name] MRN: [id number] Date of Birth: 02-24-1961   Medicare Observation Status Notification Given:  Yes    Marshell Garfinkel, RN 05/05/2017, 2:55 PM

## 2017-05-05 NOTE — Progress Notes (Signed)
Patient assisted with Incentive Spirometer by RN at a previous time, patient stated she had no questions at this time.

## 2017-05-05 NOTE — Progress Notes (Signed)
[facility]  ROUNDING NOTE   Subjective:   Hemodialysis treatment yesterday. Tolerated treatment well. UF of 1.5 liters  Continues to have nausea and abdominal pain  Objective:  Vital signs in last 24 hours:  Temp:  [98.2 F (36.8 C)-98.7 F (37.1 C)] 98.7 F (37.1 C) (08/14 1610) Pulse Rate:  [67-74] 73 (08/14 0633) Resp:  [12-21] 20 (08/14 0633) BP: (126-192)/(52-122) 171/60 (08/14 1022) SpO2:  [97 %-100 %] 97 % (08/14 0633) Weight:  [83.4 kg (183 lb 13.8 oz)-83.5 kg (184 lb)] 83.4 kg (183 lb 13.8 oz) (08/13 1450)  Weight change:  Filed Weights   05/04/17 1112 05/04/17 1450  Weight: 83.5 kg (184 lb) 83.4 kg (183 lb 13.8 oz)    Intake/Output: I/O last 3 completed shifts: In: -  Out: 1500 [Other:1500]   Intake/Output this shift:  Total I/O In: 360 [P.O.:360] Out: 225 [Urine:225]  Physical Exam: General: NAD, laying in bed  Head: Normocephalic, atraumatic. Moist oral mucosal membranes  Eyes: Anicteric, PERRL  Neck: Supple, trachea midline  Lungs:  Clear to auscultation  Heart: Regular rate and rhythm  Abdomen:  Soft, nontender, obese  Extremities: no peripheral edema. Left arm in brace  Neurologic: Nonfocal, moving all four extremities  Skin: No lesions  Access: Left arm AVF    Basic Metabolic Panel:  Recent Labs Lab 05/04/17 1122 05/05/17 0349  NA 133* 135  K 3.8 4.1  CL 93* 97*  CO2 24 29  GLUCOSE 186* 141*  BUN 29* 14  CREATININE 6.94* 4.60*  CALCIUM 8.1* 8.1*    Liver Function Tests: No results for input(s): AST, ALT, ALKPHOS, BILITOT, PROT, ALBUMIN in the last 168 hours. No results for input(s): LIPASE, AMYLASE in the last 168 hours. No results for input(s): AMMONIA in the last 168 hours.  CBC:  Recent Labs Lab 05/04/17 1122 05/05/17 0349  WBC 6.3 5.1  HGB 12.3 12.9  HCT 37.4 39.9  MCV 93.6 94.7  PLT 229 201    Cardiac Enzymes:  Recent Labs Lab 05/04/17 1122  TROPONINI <0.03    BNP: Invalid input(s):  POCBNP  CBG:  Recent Labs Lab 05/04/17 2109 05/05/17 0758  GLUCAP 180* 91    Microbiology: Results for orders placed or performed during the hospital encounter of 05/04/17  MRSA PCR Screening     Status: None   Collection Time: 05/05/17  5:55 AM  Result Value Ref Range Status   MRSA by PCR NEGATIVE NEGATIVE Final    Comment:        The GeneXpert MRSA Assay (FDA approved for NASAL specimens only), is one component of a comprehensive MRSA colonization surveillance program. It is not intended to diagnose MRSA infection nor to guide or monitor treatment for MRSA infections.     Coagulation Studies: No results for input(s): LABPROT, INR in the last 72 hours.  Urinalysis: No results for input(s): COLORURINE, LABSPEC, PHURINE, GLUCOSEU, HGBUR, BILIRUBINUR, KETONESUR, PROTEINUR, UROBILINOGEN, NITRITE, LEUKOCYTESUR in the last 72 hours.  Invalid input(s): APPERANCEUR    Imaging: Dg Chest 2 View  Result Date: 05/04/2017 CLINICAL DATA:  Status post fall last month with persistent left ribcage pain. History of asthma, COPD, former smoker, CHF, diabetes, dialysis dependent renal failure EXAM: CHEST  2 VIEW COMPARISON:  Portable chest x-ray of April 01, 2017 FINDINGS: The lungs are reasonably well inflated. The interstitial markings are increased. There small bilateral pleural effusions. The cardiac silhouette is enlarged. The pulmonary vascularity is engorged. Probable minimally displaced lateral left seventh rib fracture.  The bony thorax exhibits no acute abnormality otherwise. IMPRESSION: CHF with mild interstitial edema and small bilateral pleural effusions. No acute pneumonia. There is a probable left lateral seventh rib fracture. Electronically Signed   By: David  Martinique M.D.   On: 05/04/2017 11:56   Dg Chest Port 1 View  Result Date: 05/05/2017 CLINICAL DATA:  Shortness of breath. EXAM: PORTABLE CHEST 1 VIEW COMPARISON:  05/04/2017.  04/01/2012. FINDINGS: Cardiomegaly with mild  bilateral interstitial prominence and small bilateral pleural effusions consistent with CHF. Significant improvement from prior exam. Vascular stents are noted over the left upper extremity in stable position . IMPRESSION: Interim significant improvement of CHF . Electronically Signed   By: Marcello Moores  Register   On: 05/05/2017 07:15     Medications:    . acidophilus  1 capsule Oral Q lunch  . amLODipine  10 mg Oral Daily  . aspirin EC  81 mg Oral Daily  . atorvastatin  20 mg Oral QHS  . clopidogrel  75 mg Oral Daily  . famotidine  20 mg Oral BID  . FLUoxetine  20 mg Oral QHS  . heparin  5,000 Units Subcutaneous Q8H  . hydrALAZINE  25 mg Oral TID  . insulin aspart  0-5 Units Subcutaneous QHS  . insulin aspart  0-9 Units Subcutaneous TID WC  . lipase/protease/amylase  24,000 Units Oral Q lunch  . lipase/protease/amylase  36,000 Units Oral BID WC  . loratadine  10 mg Oral Daily  . multivitamin  1 tablet Oral Daily  . OLANZapine  5 mg Oral QHS  . omega-3 acid ethyl esters  2 g Oral BID  . pantoprazole  40 mg Oral BID AC  . pregabalin  25 mg Oral Daily  . promethazine  25 mg Intravenous Once  . rifaximin  550 mg Oral BID  . sevelamer carbonate  800 mg Oral TID WC  . cyanocobalamin  1,000 mcg Oral Q lunch     Assessment/ Plan:  [name] is a 56 y.o. black female with diabetes, hypertension, hyperlipidemia, diabetic retinopathy, diabetic neuropathy, diabetic gastroparesis, pancreatic insufficiency, depression, GERD. Renal masses  [facility] MWF [address]   1. End Stage Renal Disease: Hemodialysis yesterday. Tolerated treatment well.  Continue MWF schedule.   2. Hypertension: elevated on treatment.  - Restart home blood pressure regimen after dialysis: amlodipine and hydralazine  3. Anemia of chronic kidney disease: hemoglobin 12.9 - Mircera as outpatient.   4. Secondary Hyperparathyroidism: Outpatient calcium and phosphorus at goal.  - Sevelamer with meals.    LOS:  0 [name] 8/14/201810:34 AM

## 2017-05-05 NOTE — Care Management (Signed)
Patient admitted with chest pain.  Patient lives at home with son.  PCP Princella Ion.  Chronic HD patient.  HD information efaxed to [facility] liaison. Patient as BSC, WC, and cane in the home.  Patient previous open with [facility] home health, and had been to [facility] in the Past.  Patient previously had home O2 through [place], but it was pick up in February.  RNCM following for discharge planning needs

## 2017-05-05 NOTE — Discharge Instructions (Signed)
Follow-up with primary care physician in a week Continue hemodialysis on Monday, Wednesday and Friday and follow-up with nephrology Dr. Juleen China

## 2017-05-05 NOTE — Discharge Summary (Signed)
[name]    MR#:  [id number]  DATE OF BIRTH:  July 07, 1961  DATE OF ADMISSION:  05/04/2017 ADMITTING PHYSICIAN: Robin Leber, MD  DATE OF DISCHARGE:  05/05/17  PRIMARY CARE PHYSICIAN: Robin Sia, MD    ADMISSION DIAGNOSIS:  Shortness of breath [E72.09] Metabolic acidosis, increased anion gap (IAG) [E87.2] Acute pulmonary edema (HCC) [J81.0] Closed fracture of one rib of left side, initial encounter [S22.32XA]  DISCHARGE DIAGNOSIS:  Active Problems:   Shortness of breath Acute pulmonary edema Closed left rib fracture End-stage renal disease on hemodialysis  SECONDARY DIAGNOSIS:   Past Medical History:  Diagnosis Date  . Anemia   . Anginal pain ([place])   . Anxiety   . Arthritis   . Asthma   . Broken wrist   . Bronchitis   . chronic diastolic CHF 4/70/9628  . COPD (chronic obstructive pulmonary disease) ([place])   . Coronary artery disease    a. cath 2013: stenting to RCA (report not available); b. cath 2014: LM nl, pLAD 40%, mLAD nl, ost LCx 40%, mid LCx nl, pRCA 30% @ site of prior stent, mRCA 50%  . Depression   . Diabetes mellitus without complication ([place])   . Diabetic neuropathy ([place])   . dialysis 2006  . Diverticulosis   . Dizziness   . Dyspnea   . Elevated lipids   . Environmental and seasonal allergies   . ESRD (end stage renal disease) on dialysis ([place])    M-W-F  . GERD (gastroesophageal reflux disease)   . Headache   . History of hiatal hernia   . HOH (hard of hearing)   . Hx of pancreatitis 2015  . Hypertension   . Lower extremity edema   . Mitral regurgitation    a. echo 10/2013: EF 62%, noWMA, mildly dilated LA, mild to mod MR/TR, GR1DD  . Myocardial infarction ([place])   . Orthopnea   . Pneumonia   . Renal cancer ([place])   . Renal insufficiency    Pt is on dialysis on M,W + F.  . Wheezing     HOSPITAL COURSE:   HPI  [name]  is a 56 y.o. female with a known  history of End-stage renal disease on hemodialysis, status post recent fall with a left broken wrist, chronic diastolic CHF, COPD, diabetes, diabetic neuropathy, depression, history of coronary artery disease, chronic pancreatitis who presents to the hospital due to chest pain. Patient says he developed chest pain earlier this morning which was located in the left side of her chest and it got worse when she took a deep breath. She also had associated nausea but no vomiting, diaphoresis palpitations or dizziness. She came to the ER for further evaluation. Emergency room patient's chest x-ray was consistent with pulmonary edema and some mild CHF. It also showed that she had a left seventh rib fracture. She has had no recent trauma other than the fall she had a few weeks back. Patient missed her hemodialysis spot as an outpatient as she presented to the ER due to chest pain and given the chest x-ray findings hospitalist services were contacted further treatment and evaluation.  1. Chest pain-secondary to the left seventh rib fracture. -Supportive care with pain control, and patient's pain is improved with oxycodone . Feels comfortable to be discharged home, will prescribe 15 tablets of oxycodone with no refills -We'll continue incentive spirometry and continue supportive care for now.  2. CHF-acute on chronic diastolic CHF. Patient's chest x-ray findings on admission were consistent with pulmonary edema and some pleural effusions.  -patient has had an urgent hemodialysis yesterday and shortness of breath improved Continue dialysis as per nephrology, okay to discharge patient from nephrology standpoint -Continue hydralazine, Norvasc.  3. End-stage renal disease on hemodialysis-patient gets dialysis on Monday Wednesday and Friday. -Nephrology follow-up as an outpatient and continue hemodialysis on Monday, Wednesday and Friday.  4. History of chronic pancreatitis-continue pancreatic lipase  supplements.  5. Depression-continue Prozac.  6. GERD-continue Protonix.  DISCHARGE CONDITIONS:   stable  CONSULTS OBTAINED:  Treatment Team:  Robin Dana, MD   PROCEDURES URGENT HD  DRUG ALLERGIES:   Allergies  Allergen Reactions  . Ace Inhibitors Swelling and Anaphylaxis  . Ativan [Lorazepam] Other (See Comments)    Reaction:Hallucinations and headaches Reaction:Hallucinations and headaches Reaction:  Hallucinations and headaches  . Compazine [Prochlorperazine Edisylate] Anaphylaxis and Nausea And Vomiting    Other reaction(s): dystonia from this vs. reglan 23 Jul - patient relates that she takes promethazine frequently with no problems. 23 Jul - patient relates that she takes promethazine frequently with no problems.  . Sumatriptan Succinate     Other reaction(s): delirium and hallucinations per [facility] records  . Dilaudid [Hydromorphone Hcl] Other (See Comments)    Delirium   . Ondansetron Other (See Comments)    hallucinations    . Zofran [Ondansetron Hcl] Other (See Comments)    Reaction:  hallucinations  Reaction:  hallucinations   . Codeine Nausea And Vomiting  . Gabapentin Other (See Comments)    Reaction:  Unknown    . [place] Nausea And Vomiting  . Losartan Nausea Only  . Oxycodone Anxiety  . Prochlorperazine Other (See Comments)    Reaction:  Unknown . Patient does not remember reaction but she does have vertigo and anxiety along with n and v at times. Could be used to treat any of these   . Reglan [Metoclopramide]     Per patient her Dr. Evelina Bucy her off it  Per patient her Dr. Evelina Bucy her off it   . Scopolamine Other (See Comments)    Dizziness, also has vertigo already  . Tape Rash  . Tapentadol Rash    DISCHARGE MEDICATIONS:   Current Discharge Medication List    CONTINUE these medications which have CHANGED   Details  HYDROcodone-acetaminophen (NORCO/VICODIN) 5-325 MG tablet Take 1 tablet by mouth every 6 (six) hours as needed for  moderate pain or severe pain. Qty: 15 tablet, Refills: 0      CONTINUE these medications which have NOT CHANGED   Details  acidophilus (RISAQUAD) CAPS capsule Take 1 capsule by mouth daily with lunch.     albuterol (PROVENTIL HFA;VENTOLIN HFA) 108 (90 Base) MCG/ACT inhaler Inhale 2 puffs into the lungs every 6 (six) hours as needed for shortness of breath.     albuterol (PROVENTIL) (2.5 MG/3ML) 0.083% nebulizer solution Take 2.5 mg by nebulization every 6 (six) hours as needed for wheezing or shortness of breath.    alum & mag hydroxide-simeth (MAALOX/MYLANTA) 200-200-20 MG/5ML suspension Take 30 mLs by mouth every 6 (six) hours as needed for indigestion or heartburn. Qty: 355 mL, Refills: 0    amLODipine (NORVASC) 10 MG tablet Take 10 mg by mouth daily.    aspirin EC 81 MG tablet Take 81 mg by mouth daily.    atorvastatin (LIPITOR) 40 MG tablet Take 20 mg by mouth at bedtime.     cetirizine (ZYRTEC)  10 MG tablet Take 10 mg by mouth daily.     clopidogrel (PLAVIX) 75 MG tablet Take 1 tablet (75 mg total) by mouth daily. Qty: 30 tablet, Refills: 2    cyanocobalamin 1000 MCG tablet Take 1,000 mcg by mouth daily with lunch.     FLUoxetine (PROZAC) 20 MG capsule Take 1 capsule (20 mg total) by mouth daily. Qty: 30 capsule, Refills: 0    glucose 4 GM chewable tablet Chew 3-4 tablets by mouth as needed for low blood sugar (blood sugar less than 60).     hydrALAZINE (APRESOLINE) 25 MG tablet Take 25 mg by mouth 3 (three) times daily.    hydrOXYzine (ATARAX/VISTARIL) 25 MG tablet Take 25 mg by mouth daily.    LEVEMIR FLEXTOUCH 100 UNIT/ML Pen Inject 4 Units into the skin 2 (two) times daily.     lipase/protease/amylase (CREON) 12000 UNITS CPEP capsule Take 24,000-36,000 Units by mouth 3 (three) times daily with meals. Take 36000 mg in the morning, 24000 midday & 36000 in the evening    meclizine (ANTIVERT) 25 MG tablet 25 mg   Associated Diagnoses: Abnormal urine findings     multivitamin (RENA-VIT) TABS tablet Take 1 tablet by mouth daily.     nitroGLYCERIN (NITROSTAT) 0.4 MG SL tablet Place 0.4 mg under the tongue every 5 (five) minutes x 3 doses as needed.    NOVOLOG FLEXPEN 100 UNIT/ML FlexPen Inject 4-12 Units into the skin 3 (three) times daily before meals. Per sliding scale    OLANZapine (ZYPREXA) 5 MG tablet Take 0.5 tablets (2.5 mg total) by mouth at bedtime. Qty: 30 tablet, Refills: 0    omega-3 acid ethyl esters (LOVAZA) 1 g capsule Take 2 g by mouth 2 (two) times daily.     pantoprazole (PROTONIX) 40 MG tablet Take 1 tablet (40 mg total) by mouth 2 (two) times daily before a meal. Qty: 60 tablet, Refills: 2    pregabalin (LYRICA) 25 MG capsule Take 25 mg by mouth daily.    promethazine (PHENERGAN) 25 MG tablet Take 12.5 mg by mouth every 6 (six) hours as needed for nausea or vomiting.    ranitidine (ZANTAC) 150 MG tablet Take 150 mg by mouth 2 (two) times daily. In the afternoon    sevelamer carbonate (RENVELA) 800 MG tablet Take 800 mg by mouth 3 (three) times daily with meals.     diclofenac sodium (VOLTAREN) 1 % GEL Apply 2 g topically 2 (two) times daily as needed (for leg pain).     diphenhydrAMINE (BENADRYL) 25 mg capsule Take 25 mg by mouth every 6 (six) hours as needed for itching.    rifaximin (XIFAXAN) 550 MG TABS tablet Take 550 mg by mouth 2 (two) times daily.         DISCHARGE INSTRUCTIONS:   Follow-up with primary care physician in a week Continue hemodialysis on Monday, Wednesday and Friday and follow-up with nephrology Dr. Juleen China  DIET:  Renal diet  DISCHARGE CONDITION:  Stable  ACTIVITY:  Activity as tolerated  OXYGEN:  Home Oxygen: No.   Oxygen Delivery: room air  DISCHARGE LOCATION:  home   If you experience worsening of your admission symptoms, develop shortness of breath, life threatening emergency, suicidal or homicidal thoughts you must seek medical attention immediately by calling 911 or calling  your MD immediately  if symptoms less severe.  You Must read complete instructions/literature along with all the possible adverse reactions/side effects for all the Medicines you take and that have been  prescribed to you. Take any new Medicines after you have completely understood and accpet all the possible adverse reactions/side effects.   Please note  You were cared for by a hospitalist during your hospital stay. If you have any questions about your discharge medications or the care you received while you were in the hospital after you are discharged, you can call the unit and asked to speak with the hospitalist on call if the hospitalist that took care of you is not available. Once you are discharged, your primary care physician will handle any further medical issues. Please note that NO REFILLS for any discharge medications will be authorized once you are discharged, as it is imperative that you return to your primary care physician (or establish a relationship with a primary care physician if you do not have one) for your aftercare needs so that they can reassess your need for medications and monitor your lab values.     Today  Chief Complaint  Patient presents with  . Chest Pain   Patient denies  shortness of breath. Chest pain from the rib fracture brace manageable with the Norco reporting anterior chest wall pain with deep inspiration Reinforced the importance of being compliant with hemodialysis on Monday, Wednesday and Friday. Feels comfortable to go home.  ROS:  CONSTITUTIONAL: Denies fevers, chills. Denies any fatigue, weakness.  EYES: Denies blurry vision, double vision, eye pain. EARS, NOSE, THROAT: Denies tinnitus, ear pain, hearing loss. RESPIRATORY: Denies cough, wheeze, shortness of breath.  CARDIOVASCULAR: Denies chest pain, palpitations, edema.  GASTROINTESTINAL: Denies nausea, vomiting, diarrhea, abdominal pain. Denies bright red blood per rectum. GENITOURINARY: Denies  dysuria, hematuria. ENDOCRINE: Denies nocturia or thyroid problems. HEMATOLOGIC AND LYMPHATIC: Denies easy bruising or bleeding. SKIN: Denies rash or lesion. MUSCULOSKELETAL: Reporting anterior chest wall pain with deep inspiration  Denies pain in neck, back, shoulder, knees, hips or arthritic symptoms.  NEUROLOGIC: Denies paralysis, paresthesias.  PSYCHIATRIC: Denies anxiety or depressive symptoms.   VITAL SIGNS:  Blood pressure (!) 141/47, pulse 71, temperature 98.4 F (36.9 C), temperature source Oral, resp. rate 16, height 5\' 5"  (1.651 m), weight 83.4 kg (183 lb 13.8 oz), SpO2 99 %.  I/O:    Intake/Output Summary (Last 24 hours) at 05/05/17 1536 Last data filed at 05/05/17 1335  Gross per 24 hour  Intake              720 ml  Output             1725 ml  Net            -1005 ml    PHYSICAL EXAMINATION:  GENERAL:  56 y.o.-year-old patient lying in the bed with no acute distress.  EYES: Pupils equal, round, reactive to light and accommodation. No scleral icterus. Extraocular muscles intact.  HEENT: Head atraumatic, normocephalic. Oropharynx and nasopharynx clear.  NECK:  Supple, no jugular venous distention. No thyroid enlargement, no tenderness.  LUNGS: Normal breath sounds bilaterally, no wheezing, rales,rhonchi or crepitation. No use of accessory muscles of respiration.  CARDIOVASCULAR: Anterior chest wall is tender on deep palpation on left side S1, S2 normal. No murmurs, rubs, or gallops.  ABDOMEN: Soft, non-tender, non-distended. Bowel sounds present. No organomegaly or mass.  EXTREMITIES: No pedal edema, cyanosis, or clubbing.  NEUROLOGIC: Cranial nerves II through XII are intact. Muscle strength 5/5 in all extremities. Sensation intact. Gait not checked.  PSYCHIATRIC: The patient is alert and oriented x 3.  SKIN: No obvious rash, lesion, or ulcer.  DATA REVIEW:   CBC  Recent Labs Lab 05/05/17 0349  WBC 5.1  HGB 12.9  HCT 39.9  PLT 201    Chemistries    Recent Labs Lab 05/05/17 0349  NA 135  K 4.1  CL 97*  CO2 29  GLUCOSE 141*  BUN 14  CREATININE 4.60*  CALCIUM 8.1*    Cardiac Enzymes  Recent Labs Lab 05/04/17 1122  TROPONINI <0.03    Microbiology Results  Results for orders placed or performed during the hospital encounter of 05/04/17  MRSA PCR Screening     Status: None   Collection Time: 05/05/17  5:55 AM  Result Value Ref Range Status   MRSA by PCR NEGATIVE NEGATIVE Final    Comment:        The GeneXpert MRSA Assay (FDA approved for NASAL specimens only), is one component of a comprehensive MRSA colonization surveillance program. It is not intended to diagnose MRSA infection nor to guide or monitor treatment for MRSA infections.     RADIOLOGY:  Dg Chest 2 View  Result Date: 05/04/2017 CLINICAL DATA:  Status post fall last month with persistent left ribcage pain. History of asthma, COPD, former smoker, CHF, diabetes, dialysis dependent renal failure EXAM: CHEST  2 VIEW COMPARISON:  Portable chest x-ray of April 01, 2017 FINDINGS: The lungs are reasonably well inflated. The interstitial markings are increased. There small bilateral pleural effusions. The cardiac silhouette is enlarged. The pulmonary vascularity is engorged. Probable minimally displaced lateral left seventh rib fracture. The bony thorax exhibits no acute abnormality otherwise. IMPRESSION: CHF with mild interstitial edema and small bilateral pleural effusions. No acute pneumonia. There is a probable left lateral seventh rib fracture. Electronically Signed   By: David  Martinique M.D.   On: 05/04/2017 11:56   Dg Chest Port 1 View  Result Date: 05/05/2017 CLINICAL DATA:  Shortness of breath. EXAM: PORTABLE CHEST 1 VIEW COMPARISON:  05/04/2017.  04/01/2012. FINDINGS: Cardiomegaly with mild bilateral interstitial prominence and small bilateral pleural effusions consistent with CHF. Significant improvement from prior exam. Vascular stents are noted over the  left upper extremity in stable position . IMPRESSION: Interim significant improvement of CHF . Electronically Signed   By: Marcello Moores  Register   On: 05/05/2017 07:15    EKG:   Orders placed or performed during the hospital encounter of 05/04/17  . ED EKG within 10 minutes  . ED EKG within 10 minutes  . EKG      Management plans discussed with the patient, family and they are in agreement.  CODE STATUS:     Code Status Orders        Start     Ordered   05/04/17 1834  Full code  Continuous     05/04/17 1833    Code Status History    Date Active Date Inactive Code Status Order ID Comments User Context   03/23/2017  3:51 PM 03/25/2017  5:39 PM Full Code [id number]  Gladstone Lighter, MD Inpatient   04/21/2016  3:59 PM 04/21/2016  5:17 PM Full Code [id number]  Nicholes Mango, MD Inpatient   04/08/2016  2:15 PM 04/11/2016  7:47 PM Full Code [id number]  Theodoro Grist, MD Inpatient   03/15/2016  1:22 PM 03/20/2016 12:19 AM Full Code [id number]  Bettey Costa, MD Inpatient   02/16/2016  2:09 PM 02/19/2016 10:38 PM Full Code [id number]  Bettey Costa, MD ED   12/17/2015 11:39 AM 12/21/2015 10:09 PM Full Code [id number]  Dustin Flock, MD ED   11/12/2015  4:02 PM 12/01/2015  7:43 PM Full Code [id number]  Bettey Costa, MD Inpatient   10/07/2015  9:37 AM 10/12/2015  7:59 PM Full Code [id number]  Fritzi Mandes, MD ED   10/03/2015  3:55 AM 10/05/2015 10:30 PM Full Code [id number]  Lance Coon, MD Inpatient   08/31/2015  4:00 PM 09/03/2015 10:09 PM Full Code [id number]  Demetrios Loll, MD ED   07/26/2015  3:28 PM 07/26/2015  9:36 PM Full Code [id number]  Dionisio David, MD Inpatient   07/22/2015  5:36 PM 07/26/2015  3:28 PM Full Code [id number]  Vaughan Basta, MD Inpatient   06/05/2015  8:39 PM 06/07/2015  8:03 PM Full Code [id number]  Bettey Costa, MD Inpatient   05/21/2015  9:42 PM 05/24/2015  4:26 PM Full Code [id number]  Robin Leber, MD Inpatient   04/24/2015  8:55 AM 04/26/2015 10:11 PM Full Code [id number]  Demetrios Loll, MD  Inpatient   04/01/2015  3:58 PM 04/05/2015  8:07 PM Full Code [id number]  Epifanio Lesches, MD ED   03/06/2015  8:05 AM 03/09/2015  9:11 PM Full Code [id number]  Juluis Mire, MD Inpatient   02/19/2015  4:14 PM 02/24/2015  5:41 PM Full Code [id number]  Aldean Jewett, MD Inpatient      TOTAL TIME TAKING CARE OF THIS PATIENT: 45 minutes.   Note: This dictation was prepared with Dragon dictation along with smaller phrase technology. Any transcriptional errors that result from this process are unintentional.   @[facility] @  on 05/05/2017 at 3:36 PM  Between 7am to 6pm - Pager - [phone]  After 6pm go to [email or web address] - password [email or web address] [place] [facility]  Office  [phone]  CC: Primary care physician; Robin Sia, MD

## 2017-05-05 NOTE — Progress Notes (Signed)
Pt A and O x 4. VSS. Pt tolerating diet well. No complaints of pain or nausea. Prescriptions given. Pt voiced understanding of discharge instructions with no further questions. Pt discharged via wheelchair with nurse aide.

## 2017-05-05 NOTE — Progress Notes (Signed)
Chaplain made a follow up with visit pt. Pt appeared calm, relaxed, and stated that she was doing well. Pt talked about her children, grandchildren, nephews and nieces, she spoke about her 15 birthday that is coming up soon. Pt mentioned family that are coming up in Sep and Oct. Pt spoke about things that were important to her and was pleased to have someoneto listen to her. Upon pt's request, [place] offered prayers and ministry of presence.   05/05/17 1500  Clinical Encounter Type  Visited With Patient  Visit Type Initial  Referral From [place];Other (Comment)

## 2017-05-16 IMAGING — CR DG ABDOMEN 1V
2 series · 2 of 2 positions shown · non-contrast
Comparison: 12/05/2016

CLINICAL DATA: Sudden onset right upper quadrant abdominal pain
since this afternoon. Nausea and vomiting.

EXAM:
ABDOMEN - 1 VIEW

[abdomen kub (1 of 2)]
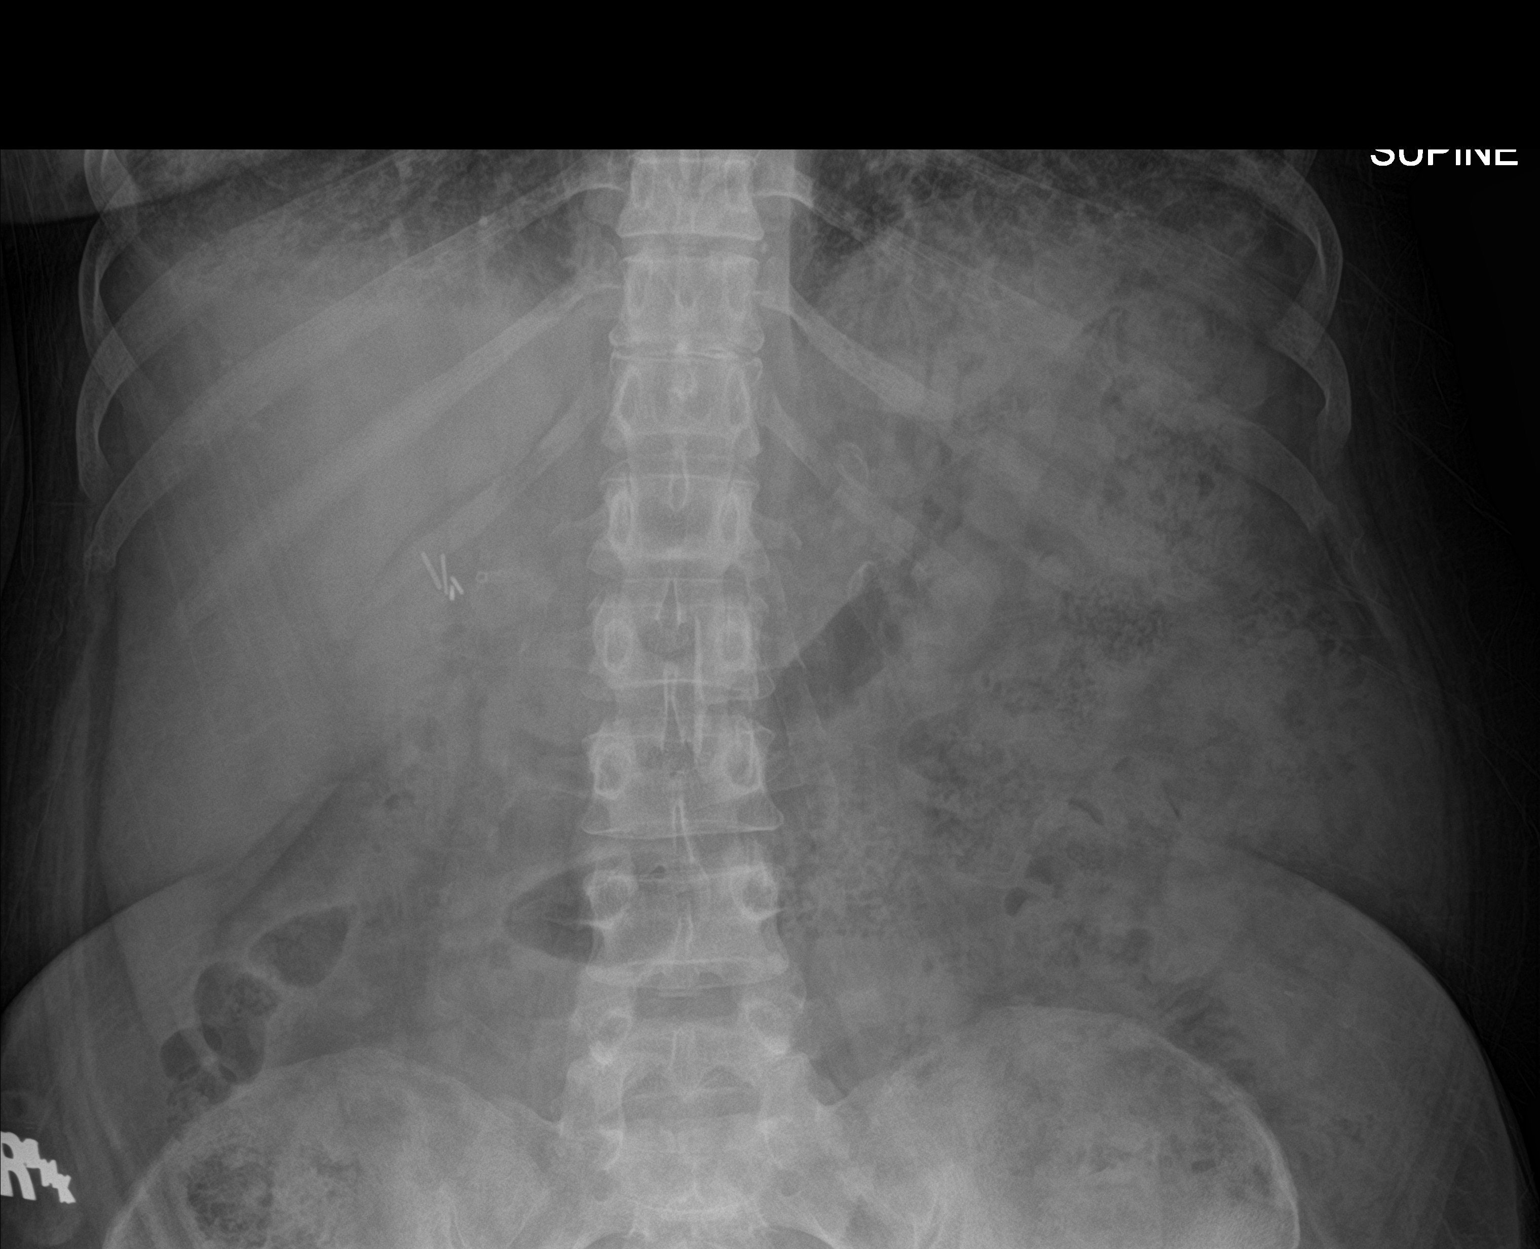

[abdomen kub (2 of 2)]
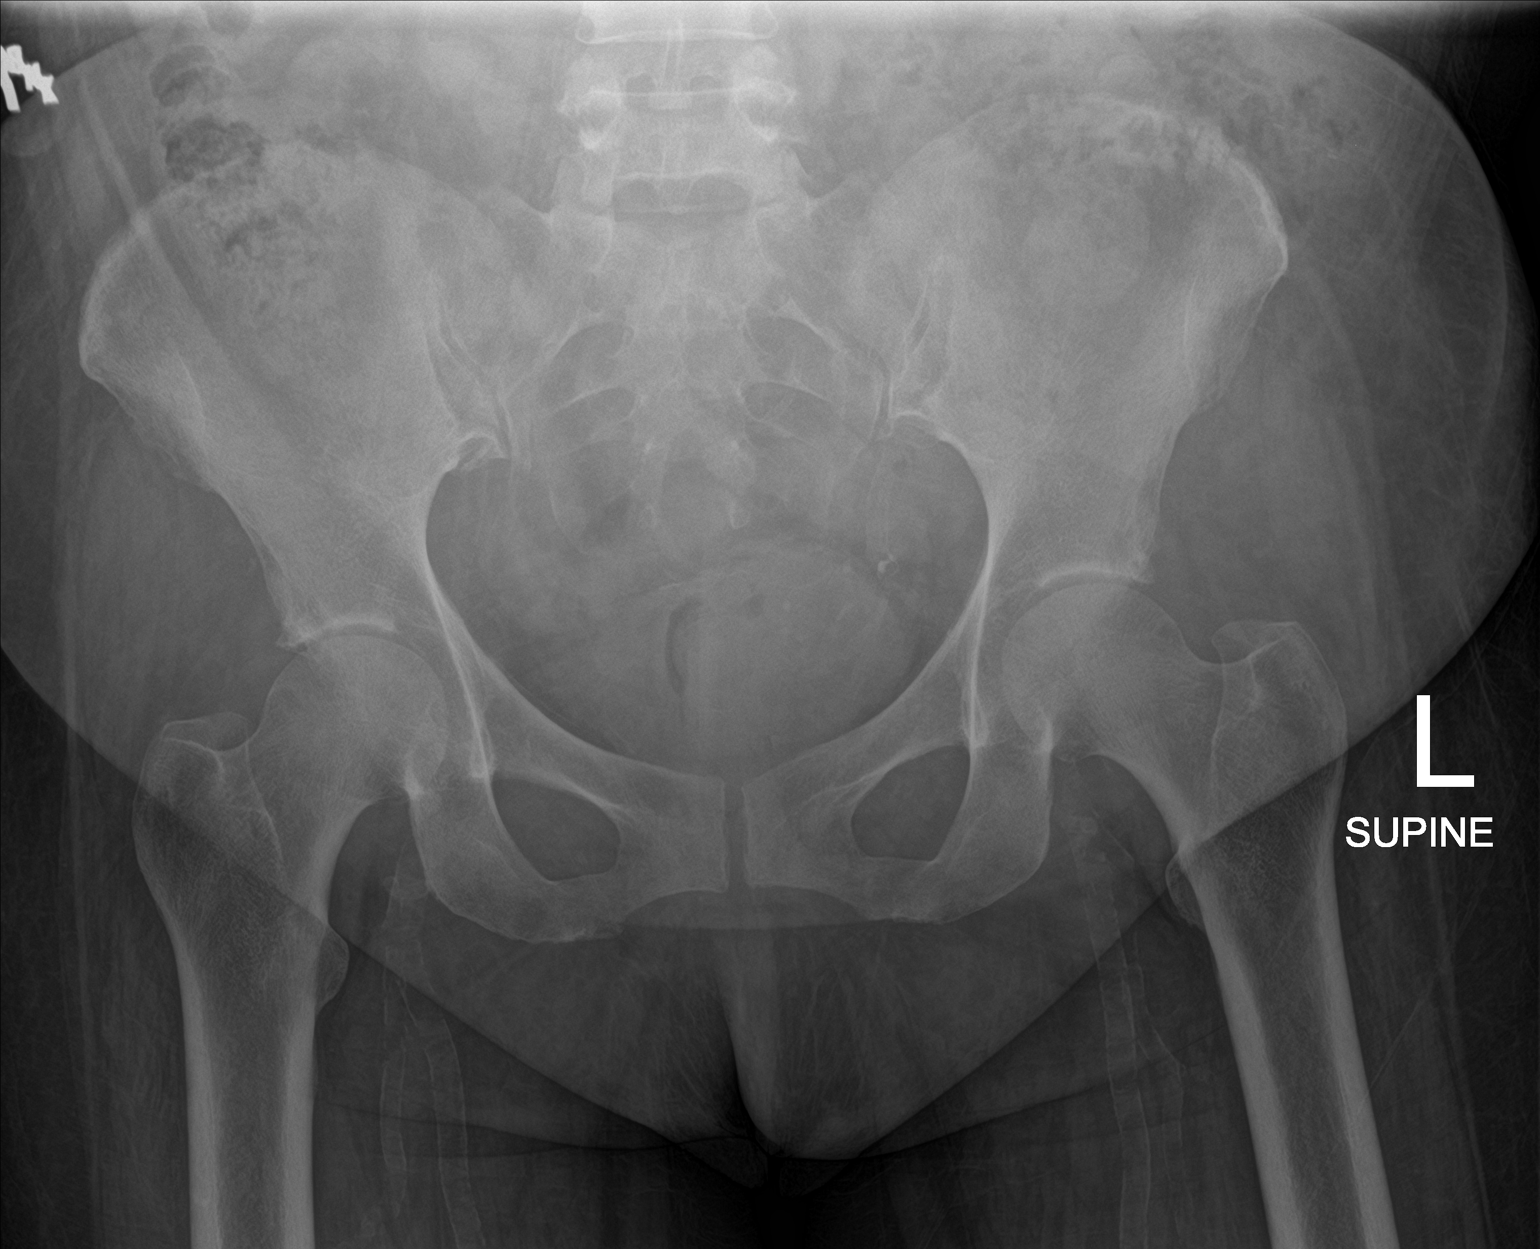

[2 of 2 positions shown; findings below may reference images not displayed]

FINDINGS: Gas and stool demonstrated throughout the colon. No small or large
bowel distention. No radiopaque stones. Prominent vascular
calcifications. Surgical clips in the right upper quadrant.
Visualized bones are grossly intact.
IMPRESSION: Nonobstructive bowel gas pattern with stool-filled colon.

## 2017-05-19 DIAGNOSIS — I10 Essential (primary) hypertension: Secondary | ICD-10-CM | POA: Insufficient documentation

## 2017-05-19 DIAGNOSIS — N185 Chronic kidney disease, stage 5: Secondary | ICD-10-CM | POA: Insufficient documentation

## 2017-05-19 DIAGNOSIS — E782 Mixed hyperlipidemia: Secondary | ICD-10-CM | POA: Insufficient documentation

## 2017-05-26 ENCOUNTER — Other Ambulatory Visit: Payer: Self-pay | Admitting: Orthopaedic Surgery

## 2017-05-26 DIAGNOSIS — S52552D Other extraarticular fracture of lower end of left radius, subsequent encounter for closed fracture with routine healing: Secondary | ICD-10-CM

## 2017-06-02 ENCOUNTER — Ambulatory Visit: Payer: Medicare Other | Attending: Orthopaedic Surgery

## 2017-06-04 ENCOUNTER — Inpatient Hospital Stay: Payer: Medicare Other | Admitting: Oncology

## 2017-06-06 IMAGING — CT CT ABD-PEL WO/W CM
2 of 12 series · 12 of 46 positions shown, 17 images · IV contrast (iopamidol)
Comparison: 12/03/2016

CLINICAL DATA: Followup kidney mass.

EXAM:
CT ABDOMEN AND PELVIS WITHOUT AND WITH CONTRAST
TECHNIQUE: Multidetector CT imaging of the abdomen and pelvis was performed
following the standard protocol before and following the bolus
administration of intravenous contrast.
CONTRAST:  100mL [id number]-FOO IOPAMIDOL ([id number]-FOO) INJECTION 61%

[Series 3: coronal pre · coronal · non-contrast · 0.56mm/px · 2 of 89 slices shown]
[im 30/89  soft-tissue]
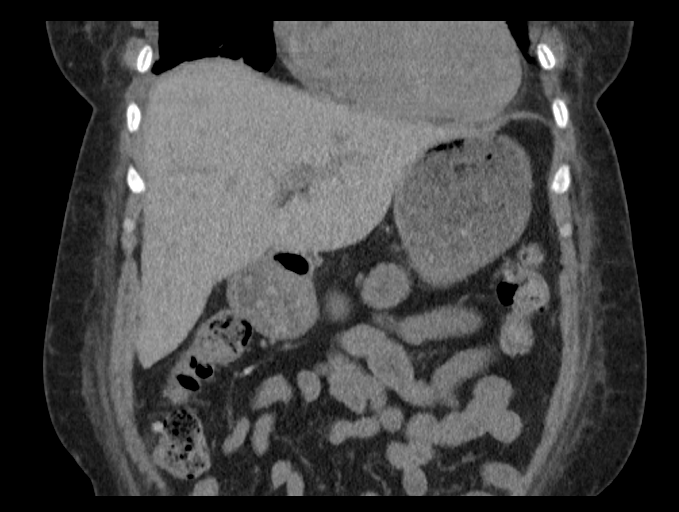
[im 59/89  soft-tissue]
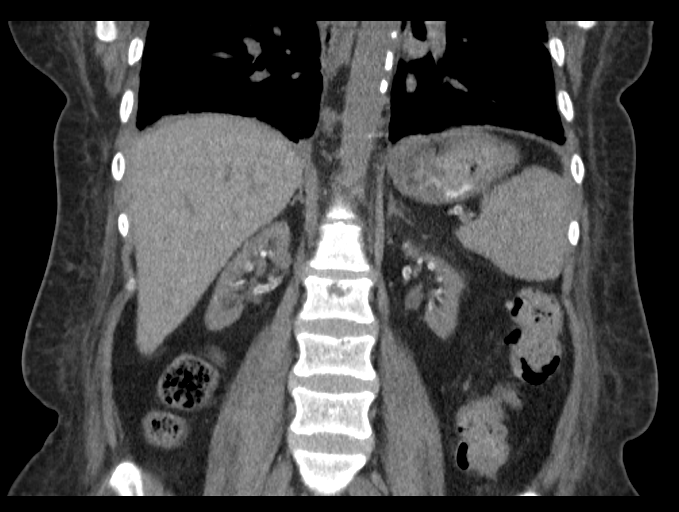

[Series 11: axial nephro · axial · 0.86mm/px · z∈[-448,-58]mm · 10 of 157 slices shown, 15 images]
[im 14/157  soft-tissue]
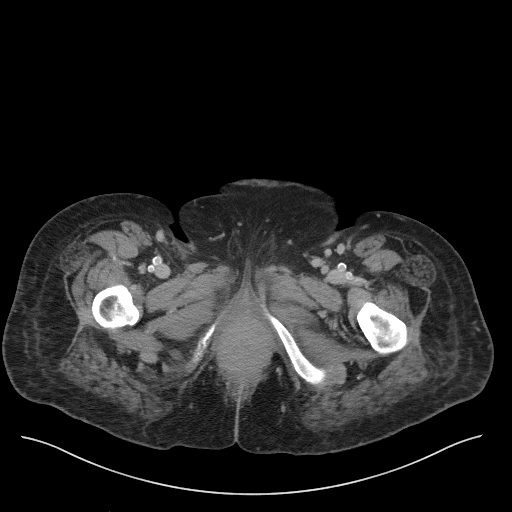
[im 14/157  bone]
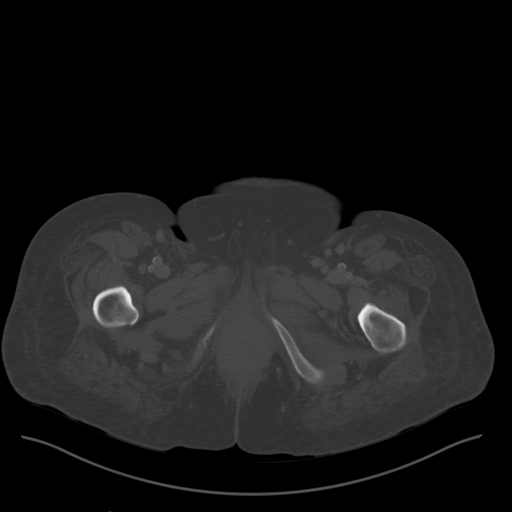
[im 27/157  soft-tissue]
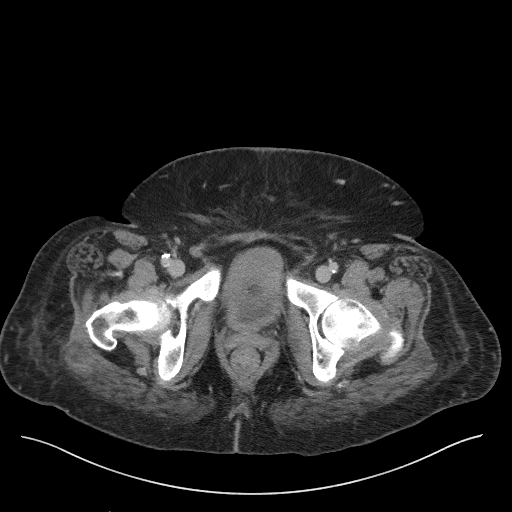
[im 53/157  soft-tissue]
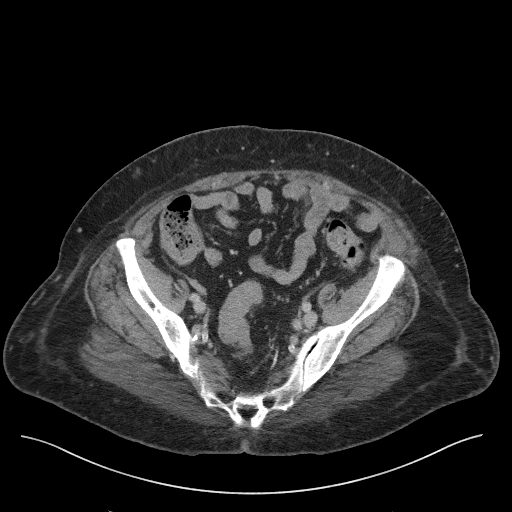
[im 66/157  soft-tissue]
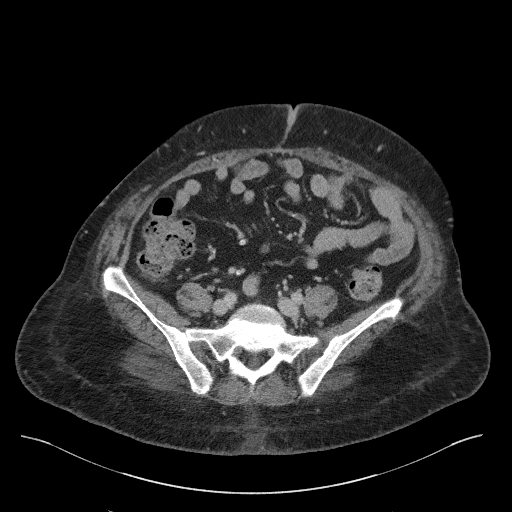
[im 79/157  soft-tissue]
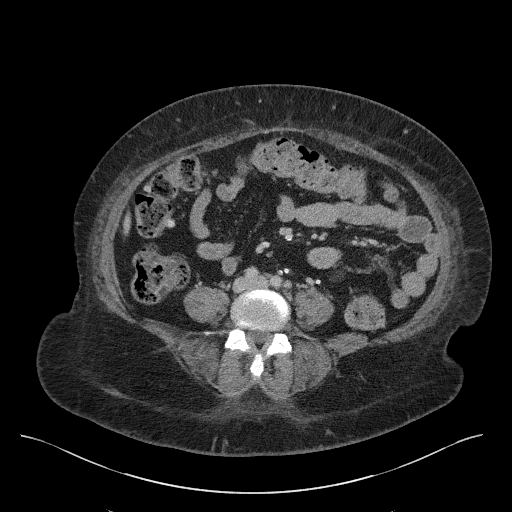
[im 92/157  soft-tissue]
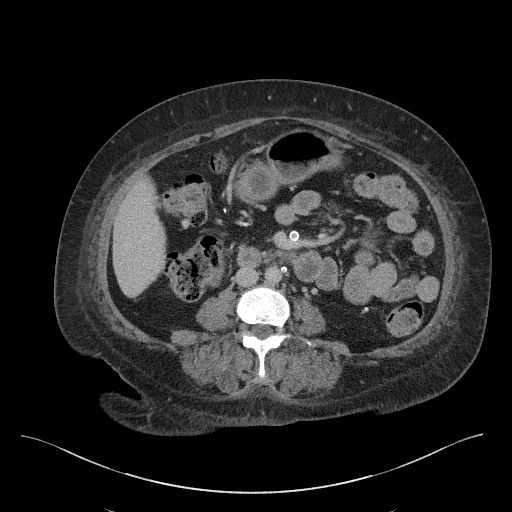
[im 105/157  soft-tissue]
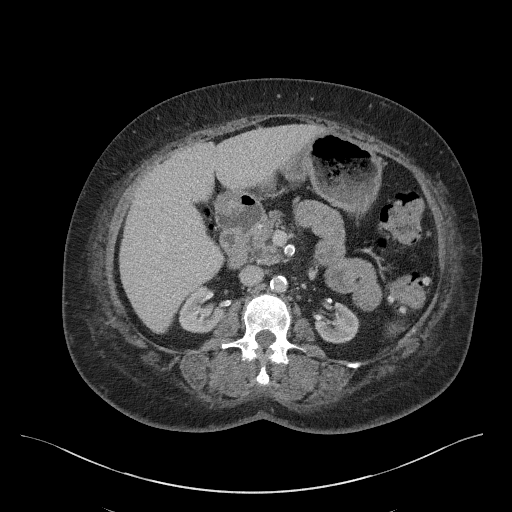
[im 105/157  lung]
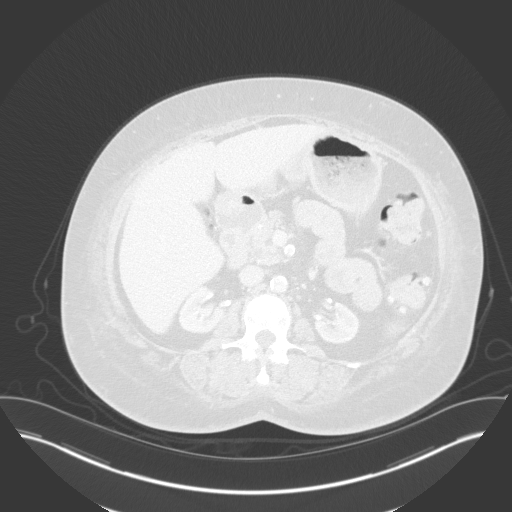
[im 118/157  lung]
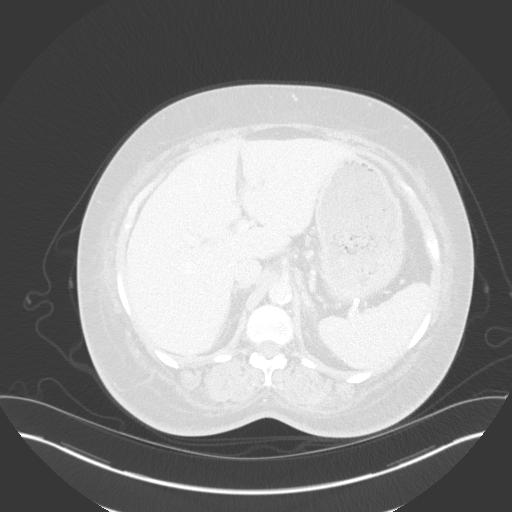
[im 131/157  soft-tissue]
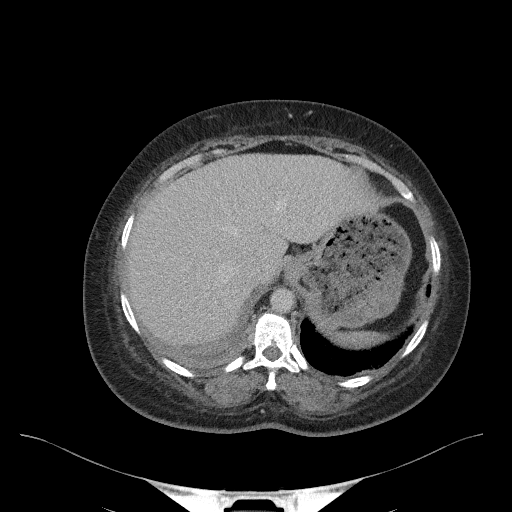
[im 131/157  lung]
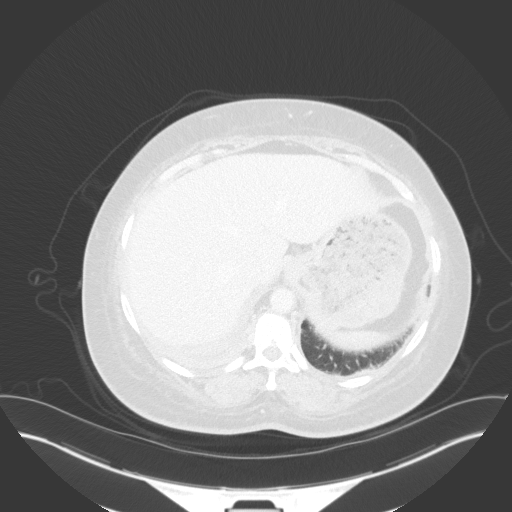
[im 144/157  soft-tissue]
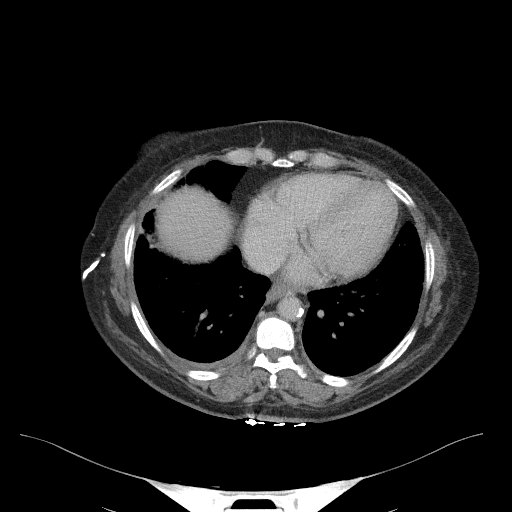
[im 144/157  lung]
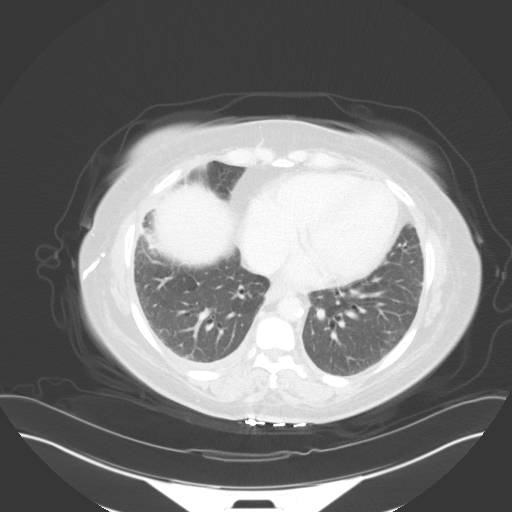
[im 144/157  bone]
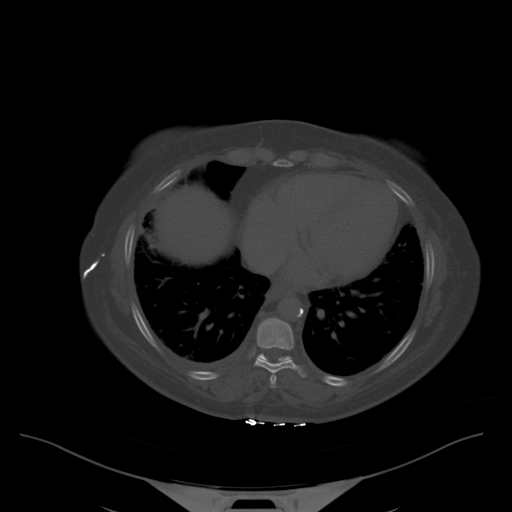

[12 of 46 positions shown; findings below may reference images not displayed]

FINDINGS: Lower chest: Small right pleural effusion. Diffuse right-sided
pleural thickening is identified and unchanged from previous exam.

Hepatobiliary: No focal liver abnormality. Mild increase caliber of
the intrahepatic and common bile ducts. The common bile duct
measures up to 9 mm, image 42 of series 14.

Pancreas: Unremarkable. No pancreatic ductal dilatation or
surrounding inflammatory changes.

Spleen: Normal in size without focal abnormality.

Adrenals/Urinary Tract: The adrenal glands appear normal. Mild
bilateral renal atrophy identified. Small arterial phase solid
enhancing lesions involving the upper pole of the right kidney are
again noted. The largest is anteriorly measuring 1.2 cm, image 48 of
series 2. Posteriorly enhancing lesion is stable measuring 11 mm,
image 46 of series 6. And the smallest lesion which is anteriorly
measures 8 mm, image 46 of series 6. Previously this measured the
same. No hydronephrosis identified. Thick walled urinary bladder is
again noted and appears partially collapsed.

Stomach/Bowel: Stomach is within normal limits. Appendix appears
normal. No evidence of bowel wall thickening, distention, or
inflammatory changes.

Vascular/Lymphatic: Aortic atherosclerosis. No upper abdominal
adenopathy. Prominent bilateral inguinal lymph nodes do left
inguinal node measures 1.4 cm, image 157 of series 11. This is
similar to 04/21/16. Right inguinal node measures 1.5 cm, image 151
of series 11. Previously 1.3 cm.

Reproductive: Previous hysterectomy.  No adnexal mass.

Other: No free fluid or fluid collections.

Musculoskeletal: No aggressive lytic or sclerotic bone lesions
identified.
IMPRESSION: 1. Stable appearance of solid appearing enhancing lesions within the
upper pole of the right kidney which are most consistent with renal
cell carcinomas. No evidence for metastatic disease.
2. Persistent right pleural effusion. Chronic right-sided pleural
thickening with enhancement is unchanged from previous exam.
3. Aortic atherosclerosis.
4. Bilateral enlarged inguinal lymph nodes are identified. A similar
finding was noted on study from 04/21/2016.

## 2017-06-08 ENCOUNTER — Encounter: Payer: Self-pay | Admitting: Emergency Medicine

## 2017-06-08 ENCOUNTER — Emergency Department
Admission: EM | Admit: 2017-06-08 | Discharge: 2017-06-08 | Disposition: A | Discharge status: Home / Self Care | Payer: Medicare Other | Attending: Emergency Medicine | Admitting: Emergency Medicine

## 2017-06-08 DIAGNOSIS — E1143 Type 2 diabetes mellitus with diabetic autonomic (poly)neuropathy: Secondary | ICD-10-CM | POA: Diagnosis not present

## 2017-06-08 DIAGNOSIS — J449 Chronic obstructive pulmonary disease, unspecified: Secondary | ICD-10-CM | POA: Insufficient documentation

## 2017-06-08 DIAGNOSIS — J45909 Unspecified asthma, uncomplicated: Secondary | ICD-10-CM | POA: Insufficient documentation

## 2017-06-08 DIAGNOSIS — I5032 Chronic diastolic (congestive) heart failure: Secondary | ICD-10-CM | POA: Insufficient documentation

## 2017-06-08 DIAGNOSIS — I2510 Atherosclerotic heart disease of native coronary artery without angina pectoris: Secondary | ICD-10-CM | POA: Insufficient documentation

## 2017-06-08 DIAGNOSIS — Z794 Long term (current) use of insulin: Secondary | ICD-10-CM | POA: Diagnosis not present

## 2017-06-08 DIAGNOSIS — N186 End stage renal disease: Secondary | ICD-10-CM | POA: Insufficient documentation

## 2017-06-08 DIAGNOSIS — E1122 Type 2 diabetes mellitus with diabetic chronic kidney disease: Secondary | ICD-10-CM | POA: Insufficient documentation

## 2017-06-08 DIAGNOSIS — Z87891 Personal history of nicotine dependence: Secondary | ICD-10-CM | POA: Diagnosis not present

## 2017-06-08 DIAGNOSIS — I132 Hypertensive heart and chronic kidney disease with heart failure and with stage 5 chronic kidney disease, or end stage renal disease: Secondary | ICD-10-CM | POA: Diagnosis not present

## 2017-06-08 DIAGNOSIS — R112 Nausea with vomiting, unspecified: Secondary | ICD-10-CM | POA: Diagnosis not present

## 2017-06-08 DIAGNOSIS — R1084 Generalized abdominal pain: Secondary | ICD-10-CM | POA: Insufficient documentation

## 2017-06-08 LAB — COMPREHENSIVE METABOLIC PANEL
ALT: 19 U/L (ref 14–54)
AST: 30 U/L (ref 15–41)
Albumin: 4 g/dL (ref 3.5–5.0)
Alkaline Phosphatase: 120 U/L (ref 38–126)
Anion gap: 16 — ABNORMAL HIGH (ref 5–15)
BUN: 43 mg/dL — ABNORMAL HIGH (ref 6–20)
CO2: 27 mmol/L (ref 22–32)
Calcium: 8.4 mg/dL — ABNORMAL LOW (ref 8.9–10.3)
Chloride: 96 mmol/L — ABNORMAL LOW (ref 101–111)
Creatinine, Ser: 8.22 mg/dL — ABNORMAL HIGH (ref 0.44–1.00)
GFR calc Af Amer: 6 mL/min — ABNORMAL LOW (ref >60)
GFR calc non Af Amer: 5 mL/min — ABNORMAL LOW (ref >60)
Glucose, Bld: 226 mg/dL — ABNORMAL HIGH (ref 65–99)
Potassium: 4.1 mmol/L (ref 3.5–5.1)
Sodium: 139 mmol/L (ref 135–145)
Total Bilirubin: 1 mg/dL (ref 0.3–1.2)
Total Protein: 8.3 g/dL — ABNORMAL HIGH (ref 6.5–8.1)

## 2017-06-08 LAB — CBC WITH DIFFERENTIAL/PLATELET
Basophils Absolute: 0 10*3/uL (ref 0–0.1)
Basophils Relative: 0 %
Eosinophils Absolute: 0.3 10*3/uL (ref 0–0.7)
Eosinophils Relative: 4 %
HCT: 33.8 % — ABNORMAL LOW (ref 35.0–47.0)
Hemoglobin: 11 g/dL — ABNORMAL LOW (ref 12.0–16.0)
Lymphocytes Relative: 24 %
Lymphs Abs: 1.8 10*3/uL (ref 1.0–3.6)
MCH: 29.6 pg (ref 26.0–34.0)
MCHC: 32.7 g/dL (ref 32.0–36.0)
MCV: 90.7 fL (ref 80.0–100.0)
Monocytes Absolute: 0.7 10*3/uL (ref 0.2–0.9)
Monocytes Relative: 9 %
Neutro Abs: 4.9 10*3/uL (ref 1.4–6.5)
Neutrophils Relative %: 63 %
Platelets: 227 10*3/uL (ref 150–440)
RBC: 3.72 MIL/uL — ABNORMAL LOW (ref 3.80–5.20)
RDW: 16.2 % — ABNORMAL HIGH (ref 11.5–14.5)
WBC: 7.7 10*3/uL (ref 3.6–11.0)

## 2017-06-08 MED ORDER — METOCLOPRAMIDE HCL 5 MG/ML IJ SOLN
10.0000 mg | Freq: Once | INTRAMUSCULAR | Status: AC
  Administered 2017-06-08: 10 mg via INTRAVENOUS
  Filled 2017-06-08: qty 2

## 2017-06-08 MED ORDER — LORAZEPAM 2 MG/ML IJ SOLN
1.0000 mg | Freq: Once | INTRAMUSCULAR | Status: AC
  Administered 2017-06-08: 1 mg via INTRAVENOUS
  Filled 2017-06-08: qty 1

## 2017-06-08 MED ORDER — METOCLOPRAMIDE HCL 10 MG PO TABS: 10.0000 mg | ORAL_TABLET | Freq: Three times a day (TID) | ORAL | 1 refills | Status: DC | PRN

## 2017-06-08 MED ORDER — LORAZEPAM 1 MG PO TABS: 1.0000 mg | ORAL_TABLET | Freq: Two times a day (BID) | ORAL | 0 refills | Status: DC

## 2017-06-08 MED ORDER — HYDROMORPHONE HCL 1 MG/ML IJ SOLN
0.5000 mg | Freq: Once | INTRAMUSCULAR | Status: AC
  Administered 2017-06-08: 0.5 mg via INTRAVENOUS
  Filled 2017-06-08: qty 1

## 2017-06-08 NOTE — ED Triage Notes (Signed)
Pt in via [facility] from home with complaints of abdominal pain, N/V/D since last night.  Pt dry heaving and spitting up mucous upon arrival, moaning in pain.  MD to bedside.

## 2017-06-08 NOTE — ED Notes (Signed)
Patient assisted to sister's car in wheelchair with this RN.

## 2017-06-08 NOTE — ED Provider Notes (Signed)
[facility] Emergency Department Provider Note       Time seen: ----------------------------------------- 8:36 AM on 06/08/2017 -----------------------------------------     I have reviewed the triage vital signs and the nursing notes.   HISTORY   Chief Complaint Abdominal Pain    HPI [name] is a 56 y.o. female who presents to the ED for severe abdominal pain that is 10 out of 10. Symptoms started last night and she has a long history of similar. She also has had some nausea and vomiting and perhaps diarrhea as well.   Past Medical History:  Diagnosis Date  . Anemia   . Anginal pain ([place])   . Anxiety   . Arthritis   . Asthma   . Broken wrist   . Bronchitis   . chronic diastolic CHF 8/92/1194  . COPD (chronic obstructive pulmonary disease) ([place])   . Coronary artery disease    a. cath 2013: stenting to RCA (report not available); b. cath 2014: LM nl, pLAD 40%, mLAD nl, ost LCx 40%, mid LCx nl, pRCA 30% @ site of prior stent, mRCA 50%  . Depression   . Diabetes mellitus without complication ([place])   . Diabetic neuropathy ([place])   . dialysis 2006  . Diverticulosis   . Dizziness   . Dyspnea   . Elevated lipids   . Environmental and seasonal allergies   . ESRD (end stage renal disease) on dialysis ([place])    M-W-F  . GERD (gastroesophageal reflux disease)   . Headache   . History of hiatal hernia   . HOH (hard of hearing)   . Hx of pancreatitis 2015  . Hypertension   . Lower extremity edema   . Mitral regurgitation    a. echo 10/2013: EF 62%, noWMA, mildly dilated LA, mild to mod MR/TR, GR1DD  . Myocardial infarction ([place])   . Orthopnea   . Pneumonia   . Renal cancer ([place])   . Renal insufficiency    Pt is on dialysis on M,W + F.  . Wheezing     Patient Active Problem List   Diagnosis Date Noted  . Shortness of breath 05/04/2017  . Cellulitis of lower extremity 07/29/2016  . Chronic venous insufficiency 07/29/2016  . Lymphedema  07/29/2016  . TIA (transient ischemic attack) 04/21/2016  . Altered mental status 04/08/2016  . Hyperammonemia ([place]) 04/08/2016  . Elevated troponin 04/08/2016  . ESRD (end stage renal disease) ([place]) 04/08/2016  . Depression 04/08/2016  . Depression, major, recurrent, severe with psychosis ([place]) 04/08/2016  . Blood in stool   . Intractable cyclical vomiting with nausea   . Reflux esophagitis   . Gastritis   . Generalized abdominal pain   . Uncontrollable vomiting   . Major depressive disorder, recurrent episode, moderate ([place]) 03/15/2016  . Adjustment disorder with mixed anxiety and depressed mood 03/15/2016  . Malnutrition of moderate degree 12/01/2015  . Renal mass   . Dyspnea   . Acute renal failure ([place])   . Respiratory failure ([place])   . High temperature 11/14/2015  . Pulmonary edema   . Encounter for central line placement   . Encounter for orogastric (OG) tube placement   . Nausea 11/12/2015  . Hyperkalemia 10/03/2015  . Diarrhea, unspecified 07/22/2015  . Pneumonia 05/21/2015  . Hypoglycemia 04/24/2015  . Unresponsiveness 04/24/2015  . Bradycardia 04/24/2015  . Hypothermia 04/24/2015  . Acute respiratory failure ([place]) 04/24/2015  . Acute diastolic CHF (congestive heart failure) ([place]) 04/05/2015  . Diabetic  gastroparesis ([place]) 04/05/2015  . Hypokalemia 04/05/2015  . Generalized weakness 04/05/2015  . Acute pulmonary edema ([place]) 04/03/2015  . Nausea and vomiting 04/03/2015  . Hypoglycemia associated with diabetes ([place]) 04/03/2015  . Anemia of chronic disease 04/03/2015  . Secondary hyperparathyroidism ([place]) 04/03/2015  . Pressure ulcer 04/02/2015  . Acute respiratory failure with hypoxia ([place]) 04/01/2015  . Adjustment disorder with anxiety 03/14/2015  . Somatic symptom disorder, mild 03/08/2015  . Coronary artery disease involving native coronary artery of native heart without angina pectoris   . Nausea & vomiting 03/06/2015  . Abdominal pain 03/06/2015  . DM  (diabetes mellitus) ([place]) 03/06/2015  . HTN (hypertension) 03/06/2015  . Gastroparesis 02/24/2015  . Pleural effusion 02/19/2015  . HCAP (healthcare-associated pneumonia) 02/19/2015  . End-stage renal disease on hemodialysis ([place]) 02/19/2015    Past Surgical History:  Procedure Laterality Date  . ABDOMINAL HYSTERECTOMY  1992  . APPENDECTOMY    . CARDIAC CATHETERIZATION Left 07/26/2015   Procedure: Left Heart Cath and Coronary Angiography;  Surgeon: Dionisio David, MD;  Location: [place] [facility];  Service: Cardiovascular;  Laterality: Left;  . CATARACT EXTRACTION W/ INTRAOCULAR LENS IMPLANT Right   . CATARACT EXTRACTION W/PHACO Left 03/10/2017   Procedure: CATARACT EXTRACTION PHACO AND INTRAOCULAR LENS PLACEMENT (IOC);  Surgeon: Birder Robson, MD;  Location: [facility] ORS;  Service: Ophthalmology;  Laterality: Left;  [place] 00:51.9 AP% 14.2 CDE 7.39 fluid pack lot # [id number]  . CHOLECYSTECTOMY    . COLONOSCOPY WITH PROPOFOL N/A 08/12/2016   Procedure: COLONOSCOPY WITH PROPOFOL;  Surgeon: Lollie Sails, MD;  Location[facility] ENDOSCOPY;  Service: Endoscopy;  Laterality: N/A;  . DIALYSIS FISTULA CREATION Left    upper arm  . dialysis grafts    . ESOPHAGOGASTRODUODENOSCOPY N/A 03/08/2015   Procedure: ESOPHAGOGASTRODUODENOSCOPY (EGD);  Surgeon: Manya Silvas, MD;  Location: [facility] ENDOSCOPY;  Service: Endoscopy;  Laterality: N/A;  . ESOPHAGOGASTRODUODENOSCOPY (EGD) WITH PROPOFOL N/A 03/18/2016   Procedure: ESOPHAGOGASTRODUODENOSCOPY (EGD) WITH PROPOFOL;  Surgeon: [name], MD;  Location: [facility] ENDOSCOPY;  Service: Endoscopy;  Laterality: N/A;  . EYE SURGERY Right 2018  . FECAL TRANSPLANT N/A 08/23/2015   Procedure: FECAL TRANSPLANT;  Surgeon: Manya Silvas, MD;  Location: [facility] ENDOSCOPY;  Service: Endoscopy;  Laterality: N/A;  . HAND SURGERY Bilateral   . PERIPHERAL VASCULAR CATHETERIZATION N/A 12/20/2015   Procedure: Thrombectomy of dialysis access versus permcath placement;  Surgeon:  Algernon Huxley, MD;  Location: [place] [facility];  Service: Cardiovascular;  Laterality: N/A;  . PERIPHERAL VASCULAR CATHETERIZATION N/A 12/20/2015   Procedure: A/V Shunt Intervention;  Surgeon: Algernon Huxley, MD;  Location: [place] [facility];  Service: Cardiovascular;  Laterality: N/A;  . PERIPHERAL VASCULAR CATHETERIZATION N/A 12/20/2015   Procedure: A/V Shuntogram/Fistulagram;  Surgeon: Algernon Huxley, MD;  Location: [place] [facility];  Service: Cardiovascular;  Laterality: N/A;  . PERIPHERAL VASCULAR CATHETERIZATION N/A 01/02/2016   Procedure: A/V Shuntogram/Fistulagram;  Surgeon: Algernon Huxley, MD;  Location: [place] [facility];  Service: Cardiovascular;  Laterality: N/A;  . PERIPHERAL VASCULAR CATHETERIZATION N/A 01/02/2016   Procedure: A/V Shunt Intervention;  Surgeon: Algernon Huxley, MD;  Location: [place] [facility] LAB;  Service: Cardiovascular;  Laterality: N/A;    Allergies Ace inhibitors; Ativan [lorazepam]; Compazine [prochlorperazine edisylate]; Sumatriptan succinate; Dilaudid [hydromorphone hcl]; Ondansetron; Zofran [ondansetron hcl]; Codeine; Gabapentin; [place] bov[place]; Losartan; Oxycodone; Prochlorperazine; Reglan [metoclopramide]; Scopolamine; Tape; and Tapentadol  Social History Social History  Substance Use Topics  . Smoking status: Former Smoker  Packs/day: 0.50    Years: 40.00    Types: Cigarettes    Quit date: 02/13/2015  . Smokeless tobacco: Never Used  . Alcohol use 0.6 oz/week    1 Glasses of wine per week     Comment: glass wine week per pt    Review of Systems Constitutional: Negative for fever. Cardiovascular: Negative for chest pain. Respiratory: Negative for shortness of breath. Gastrointestinal:positive for abdominal pain, vomiting Genitourinary: Negative for dysuria. Musculoskeletal: Negative for back pain. Skin: Negative for rash. Neurological: Negative for headaches, focal weakness or numbness.  All systems negative/normal/unremarkable except as stated in  the HPI  ____________________________________________   PHYSICAL EXAM:  VITAL SIGNS: ED Triage Vitals [06/08/17 0834]  Enc Vitals Group     BP      Pulse Rate 69     Resp (!) 22     Temp 97.6 F (36.4 C)     Temp Source Oral     SpO2 100 %     Weight 183 lb (83 kg)     Height 5\' 5"  (1.651 m)     Head Circumference      Peak Flow      Pain Score 10     Pain Loc      Pain Edu?      Excl. in [place]?     Constitutional: Alert and oriented. moderate distress Eyes: Conjunctivae are normal. Normal extraocular movements. ENT   Head: Normocephalic and atraumatic.   Nose: No congestion/rhinnorhea.   Mouth/Throat: Mucous membranes are moist.   Neck: No stridor. Cardiovascular: Normal rate, regular rhythm. No murmurs, rubs, or gallops. Respiratory: tachypnea with clear breath sounds Gastrointestinal: Soft and nontender. Normal bowel sounds Musculoskeletal: Nontender with normal range of motion in extremities. No lower extremity tenderness nor edema. Neurologic:  Normal speech and language. No gross focal neurologic deficits are appreciated.  Skin:  Skin is warm, dry and intact. No rash noted. Psychiatric:anxious ____________________________________________  ED COURSE:  Pertinent labs & imaging results that were available during my care of the patient were reviewed by me and considered in my medical decision making (see chart for details). Patient presents for acute on chronic abdominal pain and vomiting, we will assess with labs and imaging as indicated. Clinical Course as of Jun 08 1117  Mon Jun 08, 2017  0856 left-sided EJ placed by me.  [JW]  1013 patient is resting comfortably in bed, currently feeling better.  [JW]    Clinical Course User Index [JW] Earleen Newport, MD   Procedures ____________________________________________   LABS (pertinent positives/negatives)  Labs Reviewed  CBC WITH DIFFERENTIAL/PLATELET - Abnormal; Notable for the following:        Result Value   RBC 3.72 (*)    Hemoglobin 11.0 (*)    HCT 33.8 (*)    RDW 16.2 (*)    All other components within normal limits  COMPREHENSIVE METABOLIC PANEL - Abnormal; Notable for the following:    Chloride 96 (*)    Glucose, Bld 226 (*)    BUN 43 (*)    Creatinine, Ser 8.22 (*)    Calcium 8.4 (*)    Total Protein 8.3 (*)    GFR calc non Af Amer 5 (*)    GFR calc Af Amer 6 (*)    Anion gap 16 (*)    All other components within normal limits  ____________________________________________  FINAL ASSESSMENT AND PLAN  abdominal pain, vomiting, end stage renal disease   Plan: Patient's labs and  imaging were dictated above. Patient had presented for abdominal pain with vomiting and known end-stage renal disease. Her labs appear to be at her baseline. It is difficult to determine if this is gastroparesis, chronic pain or other etiology. Overall she is feeling better and she is clear for outpatient follow-up.   Earleen Newport, MD   Note: This note was generated in part or whole with voice recognition software. Voice recognition is usually quite accurate but there are transcription errors that can and very often do occur. I apologize for any typographical errors that were not detected and corrected.     Earleen Newport, MD 06/08/17 [id number]

## 2017-06-09 ENCOUNTER — Encounter: Payer: Self-pay | Admitting: Emergency Medicine

## 2017-06-09 ENCOUNTER — Emergency Department: Payer: Medicare Other

## 2017-06-09 ENCOUNTER — Emergency Department
Admission: EM | Admit: 2017-06-09 | Discharge: 2017-06-09 | Disposition: A | Discharge status: Home / Self Care | Payer: Medicare Other | Attending: Emergency Medicine | Admitting: Emergency Medicine

## 2017-06-09 DIAGNOSIS — J449 Chronic obstructive pulmonary disease, unspecified: Secondary | ICD-10-CM | POA: Diagnosis not present

## 2017-06-09 DIAGNOSIS — Z794 Long term (current) use of insulin: Secondary | ICD-10-CM | POA: Insufficient documentation

## 2017-06-09 DIAGNOSIS — Z955 Presence of coronary angioplasty implant and graft: Secondary | ICD-10-CM | POA: Diagnosis not present

## 2017-06-09 DIAGNOSIS — R1084 Generalized abdominal pain: Secondary | ICD-10-CM

## 2017-06-09 DIAGNOSIS — I5032 Chronic diastolic (congestive) heart failure: Secondary | ICD-10-CM | POA: Diagnosis not present

## 2017-06-09 DIAGNOSIS — I132 Hypertensive heart and chronic kidney disease with heart failure and with stage 5 chronic kidney disease, or end stage renal disease: Secondary | ICD-10-CM | POA: Diagnosis not present

## 2017-06-09 DIAGNOSIS — J45909 Unspecified asthma, uncomplicated: Secondary | ICD-10-CM | POA: Diagnosis not present

## 2017-06-09 DIAGNOSIS — I2510 Atherosclerotic heart disease of native coronary artery without angina pectoris: Secondary | ICD-10-CM | POA: Insufficient documentation

## 2017-06-09 DIAGNOSIS — N186 End stage renal disease: Secondary | ICD-10-CM | POA: Diagnosis not present

## 2017-06-09 DIAGNOSIS — Z87891 Personal history of nicotine dependence: Secondary | ICD-10-CM | POA: Diagnosis not present

## 2017-06-09 DIAGNOSIS — Z992 Dependence on renal dialysis: Secondary | ICD-10-CM | POA: Insufficient documentation

## 2017-06-09 DIAGNOSIS — Z79899 Other long term (current) drug therapy: Secondary | ICD-10-CM | POA: Diagnosis not present

## 2017-06-09 DIAGNOSIS — R4182 Altered mental status, unspecified: Secondary | ICD-10-CM

## 2017-06-09 DIAGNOSIS — E1143 Type 2 diabetes mellitus with diabetic autonomic (poly)neuropathy: Secondary | ICD-10-CM | POA: Diagnosis not present

## 2017-06-09 LAB — CBC WITH DIFFERENTIAL/PLATELET
Basophils Absolute: 0 10*3/uL (ref 0–0.1)
Basophils Relative: 1 %
Eosinophils Absolute: 0.3 10*3/uL (ref 0–0.7)
Eosinophils Relative: 3 %
HCT: 35 % (ref 35.0–47.0)
Hemoglobin: 11.5 g/dL — ABNORMAL LOW (ref 12.0–16.0)
Lymphocytes Relative: 25 %
Lymphs Abs: 2 10*3/uL (ref 1.0–3.6)
MCH: 30.3 pg (ref 26.0–34.0)
MCHC: 32.9 g/dL (ref 32.0–36.0)
MCV: 92.1 fL (ref 80.0–100.0)
Monocytes Absolute: 0.6 10*3/uL (ref 0.2–0.9)
Monocytes Relative: 8 %
Neutro Abs: 5.1 10*3/uL (ref 1.4–6.5)
Neutrophils Relative %: 63 %
Platelets: 160 10*3/uL (ref 150–440)
RBC: 3.8 MIL/uL (ref 3.80–5.20)
RDW: 16.2 % — ABNORMAL HIGH (ref 11.5–14.5)
WBC: 8.1 10*3/uL (ref 3.6–11.0)

## 2017-06-09 LAB — URINE DRUG SCREEN, QUALITATIVE (ARMC ONLY)
Amphetamines, Ur Screen: NOT DETECTED
Barbiturates, Ur Screen: NOT DETECTED
Benzodiazepine, Ur Scrn: NOT DETECTED
Cannabinoid 50 Ng, Ur ~~LOC~~: NOT DETECTED
Cocaine Metabolite,Ur ~~LOC~~: NOT DETECTED
MDMA (Ecstasy)Ur Screen: NOT DETECTED
Methadone Scn, Ur: NOT DETECTED
Opiate, Ur Screen: NOT DETECTED
Phencyclidine (PCP) Ur S: NOT DETECTED
Tricyclic, Ur Screen: NOT DETECTED

## 2017-06-09 LAB — COMPREHENSIVE METABOLIC PANEL
ALT: 21 U/L (ref 14–54)
AST: 32 U/L (ref 15–41)
Albumin: 3.7 g/dL (ref 3.5–5.0)
Alkaline Phosphatase: 120 U/L (ref 38–126)
Anion gap: 18 — ABNORMAL HIGH (ref 5–15)
BUN: 52 mg/dL — ABNORMAL HIGH (ref 6–20)
CO2: 25 mmol/L (ref 22–32)
Calcium: 8.3 mg/dL — ABNORMAL LOW (ref 8.9–10.3)
Chloride: 97 mmol/L — ABNORMAL LOW (ref 101–111)
Creatinine, Ser: 10.06 mg/dL — ABNORMAL HIGH (ref 0.44–1.00)
GFR calc Af Amer: 4 mL/min — ABNORMAL LOW (ref >60)
GFR calc non Af Amer: 4 mL/min — ABNORMAL LOW (ref >60)
Glucose, Bld: 166 mg/dL — ABNORMAL HIGH (ref 65–99)
Potassium: 4.8 mmol/L (ref 3.5–5.1)
Sodium: 140 mmol/L (ref 135–145)
Total Bilirubin: 1 mg/dL (ref 0.3–1.2)
Total Protein: 7.8 g/dL (ref 6.5–8.1)

## 2017-06-09 LAB — URINALYSIS, COMPLETE (UACMP) WITH MICROSCOPIC
Bacteria, UA: NONE SEEN
Bilirubin Urine: NEGATIVE
Glucose, UA: 150 mg/dL — AB
Ketones, ur: NEGATIVE mg/dL
Leukocytes, UA: NEGATIVE
Nitrite: NEGATIVE
Protein, ur: >=300 mg/dL — AB
Specific Gravity, Urine: 1.014 (ref 1.005–1.030)
pH: 8 (ref 5.0–8.0)

## 2017-06-09 LAB — TROPONIN I: Troponin I: <0.03 ng/mL (ref <0.03)

## 2017-06-09 LAB — SALICYLATE LEVEL: Salicylate Lvl: <7 mg/dL (ref 2.8–30.0)

## 2017-06-09 LAB — ACETAMINOPHEN LEVEL: Acetaminophen (Tylenol), Serum: <10 ug/mL — ABNORMAL LOW (ref 10–30)

## 2017-06-09 LAB — ETHANOL: Alcohol, Ethyl (B): <5 mg/dL (ref <5)

## 2017-06-09 MED ORDER — DIPHENHYDRAMINE HCL 50 MG/ML IJ SOLN
50.0000 mg | Freq: Once | INTRAMUSCULAR | Status: AC
  Administered 2017-06-09: 50 mg via INTRAVENOUS

## 2017-06-09 MED ORDER — FAMOTIDINE 20 MG PO TABS
20.0000 mg | ORAL_TABLET | Freq: Once | ORAL | Status: AC
  Administered 2017-06-09: 20 mg via ORAL

## 2017-06-09 MED ORDER — KETOROLAC TROMETHAMINE 30 MG/ML IJ SOLN
INTRAMUSCULAR | Status: AC
  Administered 2017-06-09: 15 mg via INTRAVENOUS
  Filled 2017-06-09: qty 1

## 2017-06-09 MED ORDER — ALUM & MAG HYDROXIDE-SIMETH 400-400-40 MG/5ML PO SUSP: 5.0000 mL | Freq: Four times a day (QID) | ORAL | 0 refills | Status: DC | PRN

## 2017-06-09 MED ORDER — DIPHENHYDRAMINE HCL 50 MG/ML IJ SOLN
INTRAMUSCULAR | Status: AC
  Administered 2017-06-09: 50 mg via INTRAVENOUS
  Filled 2017-06-09: qty 1

## 2017-06-09 MED ORDER — KETOROLAC TROMETHAMINE 30 MG/ML IJ SOLN
15.0000 mg | INTRAMUSCULAR | Status: AC
  Administered 2017-06-09: 15 mg via INTRAVENOUS

## 2017-06-09 MED ORDER — NALOXONE HCL 2 MG/2ML IJ SOSY
2.0000 mg | PREFILLED_SYRINGE | Freq: Once | INTRAMUSCULAR | Status: AC
  Administered 2017-06-09: 2 mg via INTRAVENOUS

## 2017-06-09 MED ORDER — SODIUM CHLORIDE 0.9 % IV BOLUS (SEPSIS)
500.0000 mL | Freq: Once | INTRAVENOUS | Status: AC
  Administered 2017-06-09: 500 mL via INTRAVENOUS

## 2017-06-09 MED ORDER — IOPAMIDOL (ISOVUE-300) INJECTION 61%
100.0000 mL | Freq: Once | INTRAVENOUS | Status: AC | PRN
  Administered 2017-06-09: 100 mL via INTRAVENOUS

## 2017-06-09 MED ORDER — HALOPERIDOL LACTATE 5 MG/ML IJ SOLN
5.0000 mg | Freq: Once | INTRAMUSCULAR | Status: AC
  Administered 2017-06-09: 5 mg via INTRAVENOUS
  Filled 2017-06-09: qty 1

## 2017-06-09 MED ORDER — ALUM & MAG HYDROXIDE-SIMETH 200-200-20 MG/5ML PO SUSP
ORAL | Status: AC
  Administered 2017-06-09: 30 mL via ORAL
  Filled 2017-06-09: qty 30

## 2017-06-09 MED ORDER — FAMOTIDINE 20 MG PO TABS: 20.0000 mg | ORAL_TABLET | Freq: Two times a day (BID) | ORAL | 1 refills | Status: DC

## 2017-06-09 MED ORDER — ALUM & MAG HYDROXIDE-SIMETH 200-200-20 MG/5ML PO SUSP
30.0000 mL | Freq: Once | ORAL | Status: AC
  Administered 2017-06-09: 30 mL via ORAL

## 2017-06-09 MED ORDER — FAMOTIDINE 20 MG PO TABS
ORAL_TABLET | ORAL | Status: AC
  Administered 2017-06-09: 20 mg via ORAL
  Filled 2017-06-09: qty 1

## 2017-06-09 NOTE — ED Notes (Signed)
Pt denies wearing oxygen at home throughout the day or while sleeping. When pt falls asleep in ED oxygen saturation drops into the 80s. When pt wakes and talks to RN oxygen saturation rises into the 90s

## 2017-06-09 NOTE — ED Notes (Signed)
NO response to narcan. PT remains drowsy at this time. MD made aware.

## 2017-06-09 NOTE — ED Notes (Signed)
Pt reports understanding of MD instructions. And is calling son at this time for a ride home. Pt in NAD at this time.

## 2017-06-09 NOTE — ED Notes (Signed)
Pts son called asking for an update. Son verbalized he did not need to talk to pt personally but would be coming to hospital later in the day.

## 2017-06-09 NOTE — ED Notes (Signed)
Pt taken to lobby via wheelchair and first nurse and security made aware that pts ride was on the way. Pt verbalized understanding of discharge and verbalized feeling safe to go home. Pt sent home with prescription pain medication.

## 2017-06-09 NOTE — ED Notes (Signed)
Ok to use right arm for IV/bp per EMS.  Old fistula that is no longer used present in right arm.  Left arm has working fistula.

## 2017-06-09 NOTE — ED Provider Notes (Signed)
[facility] Emergency Department Provider Note  ____________________________________________  Time seen: Approximately 1:48 PM  I have reviewed the triage vital signs and the nursing notes.   HISTORY  Chief Complaint Altered Mental Status  Level 5 Caveat: Portions of the History and Physical are unable to be obtained due to patient being a poor historian   HPI [name] is a 56 y.o. female sent to the ED fordecreased energy and responsiveness since last night. EMS found the patient with a bottle of Vicodin which is almost empty. EMS gave the patient 2 mg of intranasal Narcan and reports that the patient was more alert afterwards. Patient complains of generalized abdominal pain which she cannot describe any further. Denies dysuria, denies vomiting or diarrhea. Denies chest pain or shortness of breath. Last dialysis was yesterday, after which she was evaluated in the ED found to be stable and discharged home.     Past Medical History:  Diagnosis Date  . Anemia   . Anginal pain ([place])   . Anxiety   . Arthritis   . Asthma   . Broken wrist   . Bronchitis   . chronic diastolic CHF 1/49/7026  . COPD (chronic obstructive pulmonary disease) ([place])   . Coronary artery disease    a. cath 2013: stenting to RCA (report not available); b. cath 2014: LM nl, pLAD 40%, mLAD nl, ost LCx 40%, mid LCx nl, pRCA 30% @ site of prior stent, mRCA 50%  . Depression   . Diabetes mellitus without complication ([place])   . Diabetic neuropathy ([place])   . dialysis 2006  . Diverticulosis   . Dizziness   . Dyspnea   . Elevated lipids   . Environmental and seasonal allergies   . ESRD (end stage renal disease) on dialysis ([place])    M-W-F  . GERD (gastroesophageal reflux disease)   . Headache   . History of hiatal hernia   . HOH (hard of hearing)   . Hx of pancreatitis 2015  . Hypertension   . Lower extremity edema   . Mitral regurgitation    a. echo 10/2013: EF 62%, noWMA, mildly  dilated LA, mild to mod MR/TR, GR1DD  . Myocardial infarction ([place])   . Orthopnea   . Pneumonia   . Renal cancer ([place])   . Renal insufficiency    Pt is on dialysis on M,W + F.  . Wheezing      Patient Active Problem List   Diagnosis Date Noted  . Shortness of breath 05/04/2017  . Cellulitis of lower extremity 07/29/2016  . Chronic venous insufficiency 07/29/2016  . Lymphedema 07/29/2016  . TIA (transient ischemic attack) 04/21/2016  . Altered mental status 04/08/2016  . Hyperammonemia ([place]) 04/08/2016  . Elevated troponin 04/08/2016  . ESRD (end stage renal disease) ([place]) 04/08/2016  . Depression 04/08/2016  . Depression, major, recurrent, severe with psychosis ([place]) 04/08/2016  . Blood in stool   . Intractable cyclical vomiting with nausea   . Reflux esophagitis   . Gastritis   . Generalized abdominal pain   . Uncontrollable vomiting   . Major depressive disorder, recurrent episode, moderate ([place]) 03/15/2016  . Adjustment disorder with mixed anxiety and depressed mood 03/15/2016  . Malnutrition of moderate degree 12/01/2015  . Renal mass   . Dyspnea   . Acute renal failure ([place])   . Respiratory failure ([place])   . High temperature 11/14/2015  . Pulmonary edema   . Encounter for central line placement   .  Encounter for orogastric (OG) tube placement   . Nausea 11/12/2015  . Hyperkalemia 10/03/2015  . Diarrhea, unspecified 07/22/2015  . Pneumonia 05/21/2015  . Hypoglycemia 04/24/2015  . Unresponsiveness 04/24/2015  . Bradycardia 04/24/2015  . Hypothermia 04/24/2015  . Acute respiratory failure ([place]) 04/24/2015  . Acute diastolic CHF (congestive heart failure) ([place]) 04/05/2015  . Diabetic gastroparesis ([place]) 04/05/2015  . Hypokalemia 04/05/2015  . Generalized weakness 04/05/2015  . Acute pulmonary edema ([place]) 04/03/2015  . Nausea and vomiting 04/03/2015  . Hypoglycemia associated with diabetes ([place]) 04/03/2015  . Anemia of chronic disease 04/03/2015  . Secondary  hyperparathyroidism ([place]) 04/03/2015  . Pressure ulcer 04/02/2015  . Acute respiratory failure with hypoxia ([place]) 04/01/2015  . Adjustment disorder with anxiety 03/14/2015  . Somatic symptom disorder, mild 03/08/2015  . Coronary artery disease involving native coronary artery of native heart without angina pectoris   . Nausea & vomiting 03/06/2015  . Abdominal pain 03/06/2015  . DM (diabetes mellitus) ([place]) 03/06/2015  . HTN (hypertension) 03/06/2015  . Gastroparesis 02/24/2015  . Pleural effusion 02/19/2015  . HCAP (healthcare-associated pneumonia) 02/19/2015  . End-stage renal disease on hemodialysis ([place]) 02/19/2015     Past Surgical History:  Procedure Laterality Date  . ABDOMINAL HYSTERECTOMY  1992  . APPENDECTOMY    . CARDIAC CATHETERIZATION Left 07/26/2015   Procedure: Left Heart Cath and Coronary Angiography;  Surgeon: Dionisio David, MD;  Location: [place] CV LAB;  Service: Cardiovascular;  Laterality: Left;  . CATARACT EXTRACTION W/ INTRAOCULAR LENS IMPLANT Right   . CATARACT EXTRACTION W/PHACO Left 03/10/2017   Procedure: CATARACT EXTRACTION PHACO AND INTRAOCULAR LENS PLACEMENT (IOC);  Surgeon: Birder Robson, MD;  Location: [facility] ORS;  Service: Ophthalmology;  Laterality: Left;  [place] 00:51.9 AP% 14.2 CDE 7.39 fluid pack lot # [id number]  . CHOLECYSTECTOMY    . COLONOSCOPY WITH PROPOFOL N/A 08/12/2016   Procedure: COLONOSCOPY WITH PROPOFOL;  Surgeon: Lollie Sails, MD;  Location: [facility] ENDOSCOPY;  Service: Endoscopy;  Laterality: N/A;  . DIALYSIS FISTULA CREATION Left    upper arm  . dialysis grafts    . ESOPHAGOGASTRODUODENOSCOPY N/A 03/08/2015   Procedure: ESOPHAGOGASTRODUODENOSCOPY (EGD);  Surgeon: Manya Silvas, MD;  Location: [facility] ENDOSCOPY;  Service: Endoscopy;  Laterality: N/A;  . ESOPHAGOGASTRODUODENOSCOPY (EGD) WITH PROPOFOL N/A 03/18/2016   Procedure: ESOPHAGOGASTRODUODENOSCOPY (EGD) WITH PROPOFOL;  Surgeon: [name], MD;  Location: [facility] ENDOSCOPY;   Service: Endoscopy;  Laterality: N/A;  . EYE SURGERY Right 2018  . FECAL TRANSPLANT N/A 08/23/2015   Procedure: FECAL TRANSPLANT;  Surgeon: Manya Silvas, MD;  Location: [facility] ENDOSCOPY;  Service: Endoscopy;  Laterality: N/A;  . HAND SURGERY Bilateral   . PERIPHERAL VASCULAR CATHETERIZATION N/A 12/20/2015   Procedure: Thrombectomy of dialysis access versus permcath placement;  Surgeon: Algernon Huxley, MD;  Location: [place] CV LAB;  Service: Cardiovascular;  Laterality: N/A;  . PERIPHERAL VASCULAR CATHETERIZATION N/A 12/20/2015   Procedure: A/V Shunt Intervention;  Surgeon: Algernon Huxley, MD;  Location: [place] CV LAB;  Service: Cardiovascular;  Laterality: N/A;  . PERIPHERAL VASCULAR CATHETERIZATION N/A 12/20/2015   Procedure: A/V Shuntogram/Fistulagram;  Surgeon: Algernon Huxley, MD;  Location: [place] CV LAB;  Service: Cardiovascular;  Laterality: N/A;  . PERIPHERAL VASCULAR CATHETERIZATION N/A 01/02/2016   Procedure: A/V Shuntogram/Fistulagram;  Surgeon: Algernon Huxley, MD;  Location: [place] CV LAB;  Service: Cardiovascular;  Laterality: N/A;  . PERIPHERAL VASCULAR CATHETERIZATION N/A 01/02/2016   Procedure: A/V Shunt Intervention;  Surgeon: Algernon Huxley, MD;  Location: [place] CV LAB;  Service: Cardiovascular;  Laterality: N/A;     Prior to Admission medications   Medication Sig Start Date End Date Taking? Authorizing Provider  acidophilus (RISAQUAD) CAPS capsule Take 1 capsule by mouth daily with lunch.     [provider]  albuterol (PROVENTIL HFA;VENTOLIN HFA) 108 (90 Base) MCG/ACT inhaler Inhale 2 puffs into the lungs every 6 (six) hours as needed for shortness of breath.     [provider]  albuterol (PROVENTIL) (2.5 MG/3ML) 0.083% nebulizer solution Take 2.5 mg by nebulization every 6 (six) hours as needed for wheezing or shortness of breath.    [provider]  alum & mag hydroxide-simeth (MAALOX/MYLANTA) 200-200-20 MG/5ML suspension Take  30 mLs by mouth every 6 (six) hours as needed for indigestion or heartburn. 03/25/17   Dustin Flock, MD  amLODipine (NORVASC) 10 MG tablet Take 10 mg by mouth daily.    [provider]  aspirin EC 81 MG tablet Take 81 mg by mouth daily. 09/08/16   [provider]  atorvastatin (LIPITOR) 40 MG tablet Take 20 mg by mouth at bedtime.  12/02/16   [provider]  cetirizine (ZYRTEC) 10 MG tablet Take 10 mg by mouth daily.     [provider]  clopidogrel (PLAVIX) 75 MG tablet Take 1 tablet (75 mg total) by mouth daily. 03/25/17 03/25/18  Dustin Flock, MD  cyanocobalamin 1000 MCG tablet Take 1,000 mcg by mouth daily with lunch.     [provider]  diclofenac sodium (VOLTAREN) 1 % GEL Apply 2 g topically 2 (two) times daily as needed (for leg pain).     [provider]  diphenhydrAMINE (BENADRYL) 25 mg capsule Take 25 mg by mouth every 6 (six) hours as needed for itching.    [provider]  FLUoxetine (PROZAC) 20 MG capsule Take 1 capsule (20 mg total) by mouth daily. Patient taking differently: Take 20 mg by mouth at bedtime.  04/10/16   Hower, Aaron Mose, MD  glucose 4 GM chewable tablet Chew 3-4 tablets by mouth as needed for low blood sugar (blood sugar less than 60).     [provider]  hydrALAZINE (APRESOLINE) 25 MG tablet Take 25 mg by mouth 3 (three) times daily.    [provider]  HYDROcodone-acetaminophen (NORCO/VICODIN) 5-325 MG tablet Take 1 tablet by mouth every 6 (six) hours as needed for moderate pain or severe pain. 05/05/17   Nicholes Mango, MD  hydrOXYzine (ATARAX/VISTARIL) 25 MG tablet Take 25 mg by mouth daily.    [provider]  LEVEMIR FLEXTOUCH 100 UNIT/ML Pen Inject 4 Units into the skin 2 (two) times daily.  12/03/16   [provider]  lipase/protease/amylase (CREON) 12000 UNITS CPEP capsule Take 24,000-36,000 Units by mouth 3 (three) times daily with meals. Take 36000 mg in the morning,  24000 midday & 36000 in the evening    [provider]  LORazepam (ATIVAN) 1 MG tablet Take 1 tablet (1 mg total) by mouth 2 (two) times daily. 06/08/17 06/08/18  Earleen Newport, MD  meclizine (ANTIVERT) 25 MG tablet 25 mg    [provider]  metoCLOPramide (REGLAN) 10 MG tablet Take 1 tablet (10 mg total) by mouth every 8 (eight) hours as needed for nausea or vomiting. 06/08/17   Earleen Newport, MD  multivitamin (RENA-VIT) TABS tablet Take 1 tablet by mouth daily.     [provider]  nitroGLYCERIN (NITROSTAT) 0.4 MG SL tablet Place  0.4 mg under the tongue every 5 (five) minutes x 3 doses as needed.    [provider]  NOVOLOG FLEXPEN 100 UNIT/ML FlexPen Inject 4-12 Units into the skin 3 (three) times daily before meals. Per sliding scale 12/03/16   [provider]  OLANZapine (ZYPREXA) 5 MG tablet Take 0.5 tablets (2.5 mg total) by mouth at bedtime. Patient taking differently: Take 5 mg by mouth at bedtime.  04/29/16   Loletha Grayer, MD  omega-3 acid ethyl esters (LOVAZA) 1 g capsule Take 2 g by mouth 2 (two) times daily.     [provider]  pantoprazole (PROTONIX) 40 MG tablet Take 1 tablet (40 mg total) by mouth 2 (two) times daily before a meal. 03/19/16   Gladstone Lighter, MD  pregabalin (LYRICA) 25 MG capsule Take 25 mg by mouth daily.    [provider]  promethazine (PHENERGAN) 25 MG tablet Take 12.5 mg by mouth every 6 (six) hours as needed for nausea or vomiting.    [provider]  ranitidine (ZANTAC) 150 MG tablet Take 150 mg by mouth 2 (two) times daily. In the afternoon    [provider]  rifaximin (XIFAXAN) 550 MG TABS tablet Take 550 mg by mouth 2 (two) times daily.    [provider]  sevelamer carbonate (RENVELA) 800 MG tablet Take 800 mg by mouth 3 (three) times daily with meals.     [provider]     Allergies Ace inhibitors; Ativan [lorazepam]; Compazine  [prochlorperazine edisylate]; Sumatriptan succinate; Dilaudid [hydromorphone hcl]; Ondansetron; Zofran [ondansetron hcl]; Codeine; Gabapentin; Lac bovis; Losartan; Oxycodone; Prochlorperazine; Reglan [metoclopramide]; Scopolamine; Tape; and Tapentadol   Family History  Problem Relation Age of Onset  . Kidney disease Mother   . Diabetes Mother   . Cancer Father   . Kidney disease Sister     Social History Social History  Substance Use Topics  . Smoking status: Former Smoker    Packs/day: 0.50    Years: 40.00    Types: Cigarettes    Quit date: 02/13/2015  . Smokeless tobacco: Never Used  . Alcohol use 0.6 oz/week    1 Glasses of wine per week     Comment: glass wine week per pt    Review of Systems  Constitutional:   No fever or chills.  ENT:   No sore throat. No rhinorrhea. Cardiovascular:   No chest pain or syncope. Respiratory:   No dyspnea or cough. Gastrointestinal:   positive as above for abdominal pain without vomiting and diarrhea.  Musculoskeletal:   Negative for focal pain or swelling All other systems reviewed and are negative except as documented above in ROS and HPI.  ____________________________________________   PHYSICAL EXAM:  VITAL SIGNS: ED Triage Vitals  Enc Vitals Group     BP 06/09/17 1017 (!) 192/73     Pulse Rate 06/09/17 1017 72     Resp 06/09/17 1017 18     Temp 06/09/17 1017 97.9 F (36.6 C)     Temp Source 06/09/17 1017 Oral     SpO2 06/09/17 1017 99 %     Weight 06/09/17 1018 183 lb (83 kg)     Height 06/09/17 1018 5\' 5"  (1.651 m)     Head Circumference --      Peak Flow --      Pain Score --      Pain Loc --      Pain Edu? --      Excl. in [place]? --  Vital signs reviewed, nursing assessments reviewed.   Constitutional:   Alert and oriented. not in distress Eyes:   No scleral icterus.  EOMI. No nystagmus. No conjunctival pallor. PERRLat 1-2 mm. ENT   Head:   Normocephalic and atraumatic.   Nose:   No  congestion/rhinnorhea.    Mouth/Throat:   MMM, no pharyngeal erythema. No peritonsillar mass.    Neck:   No meningismus. Full ROM Hematological/Lymphatic/Immunilogical:   No cervical lymphadenopathy. Cardiovascular:   RRR. Symmetric bilateral radial and DP pulses.  No murmurs.  Respiratory:   Normal respiratory effort without tachypnea/retractions. Breath sounds are clear and equal bilaterally. No wheezes/rales/rhonchi. Gastrointestinal:   Soft and nontender. Non distended. There is no CVA tenderness.  No rebound, rigidity, or guarding. Genitourinary:   deferred Musculoskeletal:   Normal range of motion in all extremities. No joint effusions.  No lower extremity tenderness.  No edema. Neurologic:   slow speech. Difficulty following commands.  Motor grossly intact. .  Skin:    Skin is warm, dry and intact. No rash noted.  No petechiae, purpura, or bullae.  ____________________________________________    LABS (pertinent positives/negatives) (all labs ordered are listed, but only abnormal results are displayed) Labs Reviewed  COMPREHENSIVE METABOLIC PANEL - Abnormal; Notable for the following:       Result Value   Chloride 97 (*)    Glucose, Bld 166 (*)    BUN 52 (*)    Creatinine, Ser 10.06 (*)    Calcium 8.3 (*)    GFR calc non Af Amer 4 (*)    GFR calc Af Amer 4 (*)    Anion gap 18 (*)    All other components within normal limits  CBC WITH DIFFERENTIAL/PLATELET - Abnormal; Notable for the following:    Hemoglobin 11.5 (*)    RDW 16.2 (*)    All other components within normal limits  ACETAMINOPHEN LEVEL - Abnormal; Notable for the following:    Acetaminophen (Tylenol), Serum <10 (*)    All other components within normal limits  URINALYSIS, COMPLETE (UACMP) WITH MICROSCOPIC - Abnormal; Notable for the following:    Color, Urine YELLOW (*)    APPearance CLEAR (*)    Glucose, UA 150 (*)    Hgb urine dipstick SMALL (*)    Protein, ur >=300 (*)    Squamous Epithelial /  LPF 0-5 (*)    All other components within normal limits  URINE CULTURE  TROPONIN I  SALICYLATE LEVEL  ETHANOL  URINE DRUG SCREEN, QUALITATIVE ([facility] ONLY)   ____________________________________________   EKG  interpreted by me Sinus rhythm rate of 74, normal axis and intervals. Normal QRS ST segments and T waves.  ____________________________________________    RADIOLOGY  Ct Head Wo Contrast  Result Date: 06/09/2017 CLINICAL DATA:  Altered mental status EXAM: CT HEAD WITHOUT CONTRAST TECHNIQUE: Contiguous axial images were obtained from the base of the skull through the vertex without intravenous contrast. COMPARISON:  Head CT  04/21/2016 FINDINGS: Brain: No acute intracranial hemorrhage. No focal mass lesion. No CT evidence of acute infarction. No midline shift or mass effect. No hydrocephalus. Basilar cisterns are patent. Vascular: No hyperdense vessel or unexpected calcification. Skull: Normal. Negative for fracture or focal lesion. Sinuses/Orbits: Paranasal sinuses and mastoid air cells are clear. Orbits are clear. Other: None. IMPRESSION: No acute intracranial findings. Electronically Signed   By: Suzy Bouchard M.D.   On: 06/09/2017 11:18   Ct Abdomen Pelvis W Contrast  Result Date: 06/09/2017 CLINICAL DATA:  Lethargic  and less response of the since last evening. Abdominal pain common nausea, vomiting and diarrhea. EXAM: CT ABDOMEN AND PELVIS WITH CONTRAST TECHNIQUE: Multidetector CT imaging of the abdomen and pelvis was performed using the standard protocol following bolus administration of intravenous contrast. CONTRAST:  142mL ISOVUE-300 IOPAMIDOL (ISOVUE-300) INJECTION 61% COMPARISON:  01/29/2017 FINDINGS: Lower chest: Small right pleural effusion and bibasilar atelectasis. The heart is upper limits of normal in size. No pericardial effusion. Age advanced atherosclerotic calcifications involving the distal descending thoracic aorta. The esophagus is grossly normal.  Hepatobiliary: No focal hepatic lesions are identified. Status post cholecystectomy with stable mild intrahepatic and extrahepatic biliary dilatation. Pancreas: No mass, inflammation or ductal dilatation. Spleen: Normal size.  No focal lesions. Adrenals/Urinary Tract: The adrenal glands are normal in stable. Very small kidneys but no hydronephrosis. Stable small enhancing right renal lesions. E largest lesion measures 11 mm. Small scattered cysts. Stomach/Bowel: The stomach, duodenum, small bowel and colon are grossly normal without oral contrast. Scattered colonic diverticulosis but no findings for acute diverticulitis. Mild rectal wall thickening and enhancement could suggest proctitis. Vascular/Lymphatic: Age advanced atherosclerotic calcifications involving the abdominal aorta and branch vessels including dense calcification of the SMA and IMA. No definite CT findings for ischemic bowel. Small scattered mesenteric and retroperitoneal lymph nodes but no mass or overt adenopathy. Reproductive: Status post hysterectomy. Other: No pelvic mass or adenopathy. No free pelvic fluid collections. No inguinal mass or adenopathy. No abdominal wall hernia or subcutaneous lesions. Musculoskeletal: No significant bony findings. IMPRESSION: 1. Small right pleural effusion and bibasilar atelectasis. 2. Status post cholecystectomy with mild stable intra and extrahepatic biliary dilatation. 3. Stable small kidneys.  Patient is on dialysis. 4. Stable small enhancing right renal lesions. 5. Mild rectal wall thickening could suggest proctitis. 6. Stable advanced atherosclerotic calcifications involving the aorta and branch vessels most notably the SMA and IMA. Electronically Signed   By: Marijo Sanes M.D.   On: 06/09/2017 11:31    ____________________________________________   PROCEDURES Procedures  ____________________________________________   INITIAL IMPRESSION / ASSESSMENT AND PLAN / ED COURSE  Pertinent labs &  imaging results that were available during my care of the patient were reviewed by me and considered in my medical decision making (see chart for details).  patient presents with abdominal pain and vomiting. Also altered mental status suspected to be opioid overdose inadvertent. Mental status improved with Narcan. Patient still somewhat slowly responsive with pinpoint pupils, so get a CT scan of the head as well as abdomen pelvis.  Clinical Course as of Jun 09 1530  Tue Jun 09, 2017  1156 CT negative/stable. NAD. Labs unremarkable. Will continue to monitor in ED. If persistent AMS/hypoxia, will require hospitalization. No response to repeat narcan dose.  [PS]  1440 Felt much better, then tried to eat and now has severe pain again. Will give toradol and benadryl. Reassess.  [PS]    Clinical Course User Index [PS] Carrie Mew, MD     ----------------------------------------- 1:59 PM on 06/09/2017 -----------------------------------------  Oxygenation has been normal on room air. Workup essentially unremarkable. Has slightly elevated anion gap . We will give small IV fluid bolus for hydration, trial of oral intake. If able to take by mouth, will discharge home to follow up with primary care. Follow-up with dialysis tomorrow.   ----------------------------------------- 3:32 PM on 06/09/2017 ----------------------------------------- patient is feeling well, and then after eating had severe abdominal pain and vomiting again. Given Toradol and Benadryl. We'll repeat the oral trial. If she fails again, would  hospitalize for hydration and symptom management. Case signed out to Dr. Alfred Levins  ____________________________________________   FINAL CLINICAL IMPRESSION(S) / ED DIAGNOSES  Final diagnoses:  Generalized abdominal pain  Altered mental status, unspecified altered mental status type      New Prescriptions   No medications on file     Portions of this note were generated  with dragon dictation software. Dictation errors may occur despite best attempts at proofreading.    Carrie Mew, MD 06/09/17 [phone]

## 2017-06-09 NOTE — Discharge Instructions (Signed)
You have been seen in the Emergency Department (ED) for abdominal pain.  Your evaluation did not identify a clear cause of your symptoms but was generally reassuring. ° °Abdominal pain has many possible causes. Some aren't serious and get better on their own in a few days. Others need more testing and treatment. If your pain continues or gets worse, you need to be rechecked and may need more tests to find out what is wrong. You may need surgery to correct the problem.  ° °Follow up with your doctor in 12-24 hours if you are still having abdominal pain. Otherwise follow up in 1-3 days for a re-check ° °Don't ignore new symptoms, such as fever, nausea and vomiting, new or worsening abdominal pain, urination problems, bloody diarrhea or bloody stools, black tarry stools, uncontrollable nausea and vomiting, and dizziness. These may be signs of a more serious problem. If you develop any of these you should be seen by your doctor immediately or return to the ED. ° ° °How can you care for yourself at home?  °Rest until you feel better.  °To prevent dehydration, drink plenty of fluids, enough so that your urine is light yellow or clear like water. Choose water and other caffeine-free clear liquids until you feel better. If you have kidney, heart, or liver disease and have to limit fluids, talk with your doctor before you increase the amount of fluids you drink.  °If your stomach is upset, eat mild foods, such as rice, dry toast or crackers, bananas, and applesauce. Try eating several small meals instead of two or three large ones.  °Wait until 48 hours after all symptoms have gone away before you have spicy foods, alcohol, and drinks that contain caffeine.  °Do not eat foods that are high in fat.  °Avoid anti-inflammatory medicines such as aspirin, ibuprofen (Advil, Motrin), and naproxen (Aleve). These can cause stomach upset. Talk to your doctor if you take daily aspirin for another health problem. ° °When should you call  for help?  °Call 911 anytime you think you may need emergency care. For example, call if:  °You passed out (lost consciousness).  °You pass maroon or very bloody stools.  °You vomit blood or what looks like coffee grounds.  °You have new, severe belly pain. ° °Call your doctor now or seek immediate medical care if:  °Your pain gets worse, especially if it becomes focused in one area of your belly.  °You have a new or higher fever.  °Your stools are black and look like tar, or they have streaks of blood.  °You have unexpected vaginal bleeding.  °You have symptoms of a urinary tract infection. These may include:  °Pain when you urinate.  °Urinating more often than usual.  °Blood in your urine. °You are dizzy or lightheaded, or you feel like you may faint. °Watch closely for changes in your health, and be sure to contact your doctor if:  °You are not getting better after 1 day (24 hours). ° ° °

## 2017-06-09 NOTE — ED Notes (Signed)
Pt remains sleeping at this time. Oxygen saturation remaining around 95% on RA again. Pt continues to respond to name when called. NAD noted at this time.

## 2017-06-09 NOTE — ED Notes (Signed)
Pt reports feeling better at this time. After fluid challenge pt was screaming and reporting abd pain and nausea. Pt was able to fall asleep and now reports pain has subsided and pt denies nausea at this time.   Pt has not vomited since arrival to ED.

## 2017-06-09 NOTE — ED Notes (Addendum)
PT has become increasingly drowsy. PTs oxygen saturation on RA dropped to 81%. PT was sleeping at the time but would wake when name was called. PT placed on 2L oxygen and oxygen saturation is currently at 100% on 2L oxygen.

## 2017-06-09 NOTE — ED Notes (Signed)
Patient transported to CT 

## 2017-06-09 NOTE — ED Notes (Signed)
Pt taken off [place]. PT sitting up and talking to RN. Pt eating and drinking without difficulty.

## 2017-06-09 NOTE — ED Triage Notes (Signed)
Patient from home via [facility]. Family reports patient has been lethargic and less responsive then normal since last night. Patient brought in with bottle of hydrocodone that was filled 8/17 with 2 pills left. Per EMS, patient was given 2mg  intranasal narcan with increase in responsiveness. Upon arrival, patient is alert to person and place. Patient moaning in pain upon arrival.

## 2017-06-09 NOTE — ED Notes (Signed)
Pt reports son works until 2100 tonight and is unable to get to ED at this time. Pt is now talking to sister on the phone about coming to ED to pick her up.

## 2017-06-09 NOTE — ED Provider Notes (Signed)
-----------------------------------------   3:35 PM on 06/09/2017 -----------------------------------------   Blood pressure (!) 148/50, pulse 76, temperature 97.9 F (36.6 C), temperature source Oral, resp. rate 20, height 5\' 5"  (1.651 m), weight 83 kg (183 lb), SpO2 (!) 86 %.  Assuming care from Dr. Joni Fears of [place] [facility] is a 56 y.o. female with a chief complaint of Altered Mental Status .    Please refer to H&P by previous MD for further details.  The current plan of care is to PO challenge and reassess.   _________________________ 4:35 PM on 06/09/2017 -----------------------------------------  Patient tolerating by mouth no longer having any pain. Her vitals remained stable. Patient tells me that she has had abdominal pain and nausea for several years. She is followed by Dr. Vira Agar. She has a history of reflux and hiatal hernia but does not take any medications for it. She tells me that this problem is not new. At this time her abdomen is soft with no tenderness throughout. Her vitals are stable. She is tolerating by mouth. I recommended that she follows up with Dr. Vira Agar for further evaluation. We'll start patient on Pepcid and Maalox.   Rudene Re, MD 06/09/17 [phone]

## 2017-06-10 LAB — URINE CULTURE: Culture: NO GROWTH

## 2017-06-11 ENCOUNTER — Inpatient Hospital Stay: Payer: Medicare Other | Attending: Oncology | Admitting: Oncology

## 2017-06-11 ENCOUNTER — Encounter: Payer: Self-pay | Admitting: Oncology

## 2017-06-11 VITALS — BP 185/74 | HR 65 | Temp 97.6°F | Resp 12

## 2017-06-11 DIAGNOSIS — J449 Chronic obstructive pulmonary disease, unspecified: Secondary | ICD-10-CM | POA: Insufficient documentation

## 2017-06-11 DIAGNOSIS — M1990 Unspecified osteoarthritis, unspecified site: Secondary | ICD-10-CM | POA: Diagnosis not present

## 2017-06-11 DIAGNOSIS — I5032 Chronic diastolic (congestive) heart failure: Secondary | ICD-10-CM | POA: Insufficient documentation

## 2017-06-11 DIAGNOSIS — K219 Gastro-esophageal reflux disease without esophagitis: Secondary | ICD-10-CM

## 2017-06-11 DIAGNOSIS — I132 Hypertensive heart and chronic kidney disease with heart failure and with stage 5 chronic kidney disease, or end stage renal disease: Secondary | ICD-10-CM | POA: Diagnosis not present

## 2017-06-11 DIAGNOSIS — N186 End stage renal disease: Secondary | ICD-10-CM | POA: Insufficient documentation

## 2017-06-11 DIAGNOSIS — I2510 Atherosclerotic heart disease of native coronary artery without angina pectoris: Secondary | ICD-10-CM | POA: Insufficient documentation

## 2017-06-11 DIAGNOSIS — E785 Hyperlipidemia, unspecified: Secondary | ICD-10-CM | POA: Diagnosis not present

## 2017-06-11 DIAGNOSIS — E1140 Type 2 diabetes mellitus with diabetic neuropathy, unspecified: Secondary | ICD-10-CM | POA: Diagnosis not present

## 2017-06-11 DIAGNOSIS — R5381 Other malaise: Secondary | ICD-10-CM | POA: Diagnosis not present

## 2017-06-11 DIAGNOSIS — M545 Low back pain: Secondary | ICD-10-CM | POA: Insufficient documentation

## 2017-06-11 DIAGNOSIS — I310 Chronic adhesive pericarditis: Secondary | ICD-10-CM

## 2017-06-11 DIAGNOSIS — R21 Rash and other nonspecific skin eruption: Secondary | ICD-10-CM | POA: Diagnosis not present

## 2017-06-11 DIAGNOSIS — R5383 Other fatigue: Secondary | ICD-10-CM | POA: Diagnosis not present

## 2017-06-11 DIAGNOSIS — Z7982 Long term (current) use of aspirin: Secondary | ICD-10-CM | POA: Insufficient documentation

## 2017-06-11 DIAGNOSIS — Z87891 Personal history of nicotine dependence: Secondary | ICD-10-CM | POA: Insufficient documentation

## 2017-06-11 DIAGNOSIS — Z992 Dependence on renal dialysis: Secondary | ICD-10-CM | POA: Insufficient documentation

## 2017-06-11 DIAGNOSIS — I252 Old myocardial infarction: Secondary | ICD-10-CM

## 2017-06-11 DIAGNOSIS — E1122 Type 2 diabetes mellitus with diabetic chronic kidney disease: Secondary | ICD-10-CM | POA: Insufficient documentation

## 2017-06-11 DIAGNOSIS — Z79899 Other long term (current) drug therapy: Secondary | ICD-10-CM

## 2017-06-11 DIAGNOSIS — R8290 Unspecified abnormal findings in urine: Secondary | ICD-10-CM | POA: Insufficient documentation

## 2017-06-11 DIAGNOSIS — I340 Nonrheumatic mitral (valve) insufficiency: Secondary | ICD-10-CM | POA: Diagnosis not present

## 2017-06-11 DIAGNOSIS — Z9049 Acquired absence of other specified parts of digestive tract: Secondary | ICD-10-CM | POA: Insufficient documentation

## 2017-06-11 NOTE — Progress Notes (Signed)
Hematology/Oncology Consult note [facility]  [phone] [phone]  Patient Care Team: Ellamae Sia, MD as PCP - General (Internal Medicine) Lavonia Dana, MD as Consulting Physician (Internal Medicine)   Name of the patient: [name]  [id number]  October 12, 1960   Date of visit: 06/11/17  Diagnosis- M spike in urine  Chief complaint/ Reason for visit- routine f/u for abnormal urine M spike  Heme/Onc history: patient is a 56 year old female with a past medical history significant for coronary artery disease, type 2 diabetes with neuropathy, hypertension hyperlipidemia and osteopenia arthritis. She has ESRD secondary to HTN/DM and has been on dialysis for about 14 years. She was seen by rheumatology for complaints of low back pain. She also had painful subcutaneous skin rashes and was seen by dermatology and biopsy of one of the lesions showed mild septal panicky like this. As a part of workup by rheumatology doctor Dr. Lezlie Lye- Meda Coffee she had SPEP done which came back abnormal with the presence of M spike and hence has been referred to [place]. There was still some ongoing concern about possible lupus and she follows up with rheumatology. Her last colonoscopy was in November 2017 which showed tubular adenoma and a repeat colonoscopy was recommended in 5 years.   CBC from 01/08/2017 showed white count of 6.7, H&H of 13.9/42.8 and a platelet count of 161. CMP showed elevated BUN of 43 and creatinine of 5.22. Calcium was normal at 9.7. Total protein was mildly elevated at 8.6. An albumin was normal at 3.9. SPEP from 11/06/2016 did not reveal any M spike. M spike of 4.2% was noted on 24-hour urine protein. A lambda free light chain ratio was normal although both And lambda were elevated due to chronic kidney disease. Patient has been unable to give [place] 24 urine sample to test for M protein   Interval history- feels nauseous today and fatigued. This has  been a chronic issues for her  ECOG PS- 2 Pain scale- 8- back and abdominal pain   Review of systems- Review of Systems  Constitutional: Positive for malaise/fatigue. Negative for chills, fever and weight loss.  HENT: Negative for congestion, ear discharge and nosebleeds.   Eyes: Negative for blurred vision.  Respiratory: Negative for cough, hemoptysis, sputum production, shortness of breath and wheezing.   Cardiovascular: Negative for chest pain, palpitations, orthopnea and claudication.  Gastrointestinal: Positive for abdominal pain and nausea. Negative for blood in stool, constipation, diarrhea, heartburn, melena and vomiting.  Genitourinary: Negative for dysuria, flank pain, frequency, hematuria and urgency.  Musculoskeletal: Negative for back pain, joint pain and myalgias.  Skin: Negative for rash.  Neurological: Negative for dizziness, tingling, focal weakness, seizures, weakness and headaches.  Endo/Heme/Allergies: Does not bruise/bleed easily.  Psychiatric/Behavioral: Negative for depression and suicidal ideas. The patient does not have insomnia.        Allergies  Allergen Reactions  . Ace Inhibitors Swelling and Anaphylaxis  . Ativan [Lorazepam] Other (See Comments)    Reaction:Hallucinations and headaches Reaction:Hallucinations and headaches Reaction:  Hallucinations and headaches  . Compazine [Prochlorperazine Edisylate] Anaphylaxis and Nausea And Vomiting    Other reaction(s): dystonia from this vs. reglan 23 Jul - patient relates that she takes promethazine frequently with no problems. 23 Jul - patient relates that she takes promethazine frequently with no problems.  . Sumatriptan Succinate     Other reaction(s): delirium and hallucinations per [facility] records  . Dilaudid [Hydromorphone Hcl] Other (See Comments)    Delirium   .  Ondansetron Other (See Comments)    hallucinations    . Zofran [Ondansetron Hcl] Other (See Comments)    Reaction:  hallucinations    Reaction:  hallucinations   . Codeine Nausea And Vomiting  . Gabapentin Other (See Comments)    Reaction:  Unknown    . [place] Nausea And Vomiting  . Losartan Nausea Only  . Oxycodone Anxiety  . Prochlorperazine Other (See Comments)    Reaction:  Unknown . Patient does not remember reaction but she does have vertigo and anxiety along with n and v at times. Could be used to treat any of these   . Reglan [Metoclopramide]     Per patient her Dr. Evelina Bucy her off it  Per patient her Dr. Evelina Bucy her off it   . Scopolamine Other (See Comments)    Dizziness, also has vertigo already  . Tape Rash  . Tapentadol Rash     Past Medical History:  Diagnosis Date  . Anemia   . Anginal pain ([place])   . Anxiety   . Arthritis   . Asthma   . Broken wrist   . Bronchitis   . chronic diastolic CHF 0/16/0109  . COPD (chronic obstructive pulmonary disease) ([place])   . Coronary artery disease    a. cath 2013: stenting to RCA (report not available); b. cath 2014: LM nl, pLAD 40%, mLAD nl, ost LCx 40%, mid LCx nl, pRCA 30% @ site of prior stent, mRCA 50%  . Depression   . Diabetes mellitus without complication ([place])   . Diabetic neuropathy ([place])   . dialysis 2006  . Diverticulosis   . Dizziness   . Dyspnea   . Elevated lipids   . Environmental and seasonal allergies   . ESRD (end stage renal disease) on dialysis ([place])    M-W-F  . GERD (gastroesophageal reflux disease)   . Headache   . History of hiatal hernia   . HOH (hard of hearing)   . Hx of pancreatitis 2015  . Hypertension   . Lower extremity edema   . Mitral regurgitation    a. echo 10/2013: EF 62%, noWMA, mildly dilated LA, mild to mod MR/TR, GR1DD  . Myocardial infarction ([place])   . Orthopnea   . Pneumonia   . Renal cancer ([place])   . Renal insufficiency    Pt is on dialysis on M,W + F.  . Wheezing      Past Surgical History:  Procedure Laterality Date  . ABDOMINAL HYSTERECTOMY  1992  . APPENDECTOMY    . CARDIAC CATHETERIZATION  Left 07/26/2015   Procedure: Left Heart Cath and Coronary Angiography;  Surgeon: Dionisio David, MD;  Location: [place] CV LAB;  Service: Cardiovascular;  Laterality: Left;  . CATARACT EXTRACTION W/ INTRAOCULAR LENS IMPLANT Right   . CATARACT EXTRACTION W/PHACO Left 03/10/2017   Procedure: CATARACT EXTRACTION PHACO AND INTRAOCULAR LENS PLACEMENT (IOC);  Surgeon: Birder Robson, MD;  Location: [facility] ORS;  Service: Ophthalmology;  Laterality: Left;  [place] 00:51.9 AP% 14.2 CDE 7.39 fluid pack lot # [id number]  . CHOLECYSTECTOMY    . COLONOSCOPY WITH PROPOFOL N/A 08/12/2016   Procedure: COLONOSCOPY WITH PROPOFOL;  Surgeon: Lollie Sails, MD;  Location: [facility] ENDOSCOPY;  Service: Endoscopy;  Laterality: N/A;  . DIALYSIS FISTULA CREATION Left    upper arm  . dialysis grafts    . ESOPHAGOGASTRODUODENOSCOPY N/A 03/08/2015   Procedure: ESOPHAGOGASTRODUODENOSCOPY (EGD);  Surgeon: Manya Silvas, MD;  Location[facility] ENDOSCOPY;  Service: Endoscopy;  Laterality: N/A;  . ESOPHAGOGASTRODUODENOSCOPY (EGD) WITH PROPOFOL N/A 03/18/2016   Procedure: ESOPHAGOGASTRODUODENOSCOPY (EGD) WITH PROPOFOL;  Surgeon: [name], MD;  Location: [facility] ENDOSCOPY;  Service: Endoscopy;  Laterality: N/A;  . EYE SURGERY Right 2018  . FECAL TRANSPLANT N/A 08/23/2015   Procedure: FECAL TRANSPLANT;  Surgeon: Manya Silvas, MD;  Location: [facility] ENDOSCOPY;  Service: Endoscopy;  Laterality: N/A;  . HAND SURGERY Bilateral   . PERIPHERAL VASCULAR CATHETERIZATION N/A 12/20/2015   Procedure: Thrombectomy of dialysis access versus permcath placement;  Surgeon: Algernon Huxley, MD;  Location: [place] [facility];  Service: Cardiovascular;  Laterality: N/A;  . PERIPHERAL VASCULAR CATHETERIZATION N/A 12/20/2015   Procedure: A/V Shunt Intervention;  Surgeon: Algernon Huxley, MD;  Location: [place] [facility];  Service: Cardiovascular;  Laterality: N/A;  . PERIPHERAL VASCULAR CATHETERIZATION N/A 12/20/2015   Procedure: A/V  Shuntogram/Fistulagram;  Surgeon: Algernon Huxley, MD;  Location: [place] [facility];  Service: Cardiovascular;  Laterality: N/A;  . PERIPHERAL VASCULAR CATHETERIZATION N/A 01/02/2016   Procedure: A/V Shuntogram/Fistulagram;  Surgeon: Algernon Huxley, MD;  Location: [place] [facility] LAB;  Service: Cardiovascular;  Laterality: N/A;  . PERIPHERAL VASCULAR CATHETERIZATION N/A 01/02/2016   Procedure: A/V Shunt Intervention;  Surgeon: Algernon Huxley, MD;  Location: [place] CV LAB;  Service: Cardiovascular;  Laterality: N/A;    Social History   Social History  . Marital status: Divorced    Spouse name: N/A  . Number of children: N/A  . Years of education: N/A   Occupational History  . Not on file.   Social History Main Topics  . Smoking status: Former Smoker    Packs/day: 0.50    Years: 40.00    Types: Cigarettes    Quit date: 02/13/2015  . Smokeless tobacco: Never Used  . Alcohol use 0.6 oz/week    1 Glasses of wine per week     Comment: glass wine week per pt  . Drug use: No  . Sexual activity: No   Other Topics Concern  . Not on file   Social History Narrative  . No narrative on file    Family History  Problem Relation Age of Onset  . Kidney disease Mother   . Diabetes Mother   . Cancer Father   . Kidney disease Sister      Current Outpatient Prescriptions:  .  acidophilus (RISAQUAD) CAPS capsule, Take 1 capsule by mouth daily with lunch. , Disp: , Rfl:  .  albuterol (PROVENTIL HFA;VENTOLIN HFA) 108 (90 Base) MCG/ACT inhaler, Inhale 2 puffs into the lungs every 6 (six) hours as needed for shortness of breath. , Disp: , Rfl:  .  albuterol (PROVENTIL) (2.5 MG/3ML) 0.083% nebulizer solution, Take 2.5 mg by nebulization every 6 (six) hours as needed for wheezing or shortness of breath., Disp: , Rfl:  .  alum & mag hydroxide-simeth (MAALOX MAX) 400-400-40 MG/5ML suspension, Take 5 mLs by mouth every 6 (six) hours as needed for indigestion., Disp: 355 mL, Rfl: 0 .  amLODipine  (NORVASC) 10 MG tablet, Take 10 mg by mouth daily., Disp: , Rfl:  .  aspirin EC 81 MG tablet, Take 81 mg by mouth daily., Disp: , Rfl:  .  atorvastatin (LIPITOR) 40 MG tablet, Take 20 mg by mouth at bedtime. , Disp: , Rfl:  .  cetirizine (ZYRTEC) 10 MG tablet, Take 10 mg by mouth daily. , Disp: , Rfl:  .  clopidogrel (PLAVIX) 75 MG tablet, Take 1 tablet (75 mg  total) by mouth daily., Disp: 30 tablet, Rfl: 2 .  cyanocobalamin 1000 MCG tablet, Take 1,000 mcg by mouth daily with lunch. , Disp: , Rfl:  .  diclofenac sodium (VOLTAREN) 1 % GEL, Apply 2 g topically 2 (two) times daily as needed (for leg pain). , Disp: , Rfl:  .  diphenhydrAMINE (BENADRYL) 25 mg capsule, Take 25 mg by mouth every 6 (six) hours as needed for itching., Disp: , Rfl:  .  famotidine (PEPCID) 20 MG tablet, Take 1 tablet (20 mg total) by mouth 2 (two) times daily., Disp: 60 tablet, Rfl: 1 .  FLUoxetine (PROZAC) 20 MG capsule, Take 1 capsule (20 mg total) by mouth daily. (Patient taking differently: Take 20 mg by mouth at bedtime. ), Disp: 30 capsule, Rfl: 0 .  glucose 4 GM chewable tablet, Chew 3-4 tablets by mouth as needed for low blood sugar (blood sugar less than 60). , Disp: , Rfl:  .  hydrALAZINE (APRESOLINE) 25 MG tablet, Take 25 mg by mouth 3 (three) times daily., Disp: , Rfl:  .  HYDROcodone-acetaminophen (NORCO/VICODIN) 5-325 MG tablet, Take 1 tablet by mouth every 6 (six) hours as needed for moderate pain or severe pain., Disp: 15 tablet, Rfl: 0 .  hydrOXYzine (ATARAX/VISTARIL) 25 MG tablet, Take 25 mg by mouth daily., Disp: , Rfl:  .  LEVEMIR FLEXTOUCH 100 UNIT/ML Pen, Inject 4 Units into the skin 2 (two) times daily. , Disp: , Rfl:  .  lipase/protease/amylase (CREON) 12000 UNITS CPEP capsule, Take 24,000-36,000 Units by mouth 3 (three) times daily with meals. Take 36000 mg in the morning, 24000 midday & 36000 in the evening, Disp: , Rfl:  .  LORazepam (ATIVAN) 1 MG tablet, Take 1 tablet (1 mg total) by mouth 2 (two)  times daily., Disp: 20 tablet, Rfl: 0 .  meclizine (ANTIVERT) 25 MG tablet, 25 mg, Disp: , Rfl:  .  metoCLOPramide (REGLAN) 10 MG tablet, Take 1 tablet (10 mg total) by mouth every 8 (eight) hours as needed for nausea or vomiting., Disp: 30 tablet, Rfl: 1 .  multivitamin (RENA-VIT) TABS tablet, Take 1 tablet by mouth daily. , Disp: , Rfl:  .  nitroGLYCERIN (NITROSTAT) 0.4 MG SL tablet, Place 0.4 mg under the tongue every 5 (five) minutes x 3 doses as needed., Disp: , Rfl:  .  NOVOLOG FLEXPEN 100 UNIT/ML FlexPen, Inject 4-12 Units into the skin 3 (three) times daily before meals. Per sliding scale, Disp: , Rfl:  .  OLANZapine (ZYPREXA) 5 MG tablet, Take 0.5 tablets (2.5 mg total) by mouth at bedtime. (Patient taking differently: Take 5 mg by mouth at bedtime. ), Disp: 30 tablet, Rfl: 0 .  omega-3 acid ethyl esters (LOVAZA) 1 g capsule, Take 2 g by mouth 2 (two) times daily. , Disp: , Rfl:  .  pantoprazole (PROTONIX) 40 MG tablet, Take 1 tablet (40 mg total) by mouth 2 (two) times daily before a meal., Disp: 60 tablet, Rfl: 2 .  pregabalin (LYRICA) 25 MG capsule, Take 25 mg by mouth daily., Disp: , Rfl:  .  promethazine (PHENERGAN) 25 MG tablet, Take 12.5 mg by mouth every 6 (six) hours as needed for nausea or vomiting., Disp: , Rfl:  .  ranitidine (ZANTAC) 150 MG tablet, Take 150 mg by mouth 2 (two) times daily. In the afternoon, Disp: , Rfl:  .  rifaximin (XIFAXAN) 550 MG TABS tablet, Take 550 mg by mouth 2 (two) times daily., Disp: , Rfl:  .  sevelamer carbonate (RENVELA)  800 MG tablet, Take 800 mg by mouth 3 (three) times daily with meals. , Disp: , Rfl:   Physical exam:  Vitals:   06/11/17 0929 06/11/17 0932  BP: (!) 185/74   Pulse: 65   Resp:  12  Temp: 97.6 F (36.4 C)   TempSrc: Tympanic   SpO2: 97%    Physical Exam  Constitutional: She is oriented to person, place, and time.  Looks older than stated age. Sitting ina wheelchair. Appears in mild distress due to nausea  HENT:  Head:  Normocephalic and atraumatic.  Eyes: Pupils are equal, round, and reactive to light. EOM are normal.  Neck: Normal range of motion.  Cardiovascular: Normal rate, regular rhythm and normal heart sounds.   Pulmonary/Chest: Effort normal and breath sounds normal.  Abdominal: Soft. Bowel sounds are normal.  Neurological: She is alert and oriented to person, place, and time.  Skin: Skin is warm and dry.     CMP Latest Ref Rng & Units 06/09/2017  Glucose 65 - 99 mg/dL 166(H)  BUN 6 - 20 mg/dL 52(H)  Creatinine 0.44 - 1.00 mg/dL 10.06(H)  Sodium 135 - 145 mmol/L 140  Potassium 3.5 - 5.1 mmol/L 4.8  Chloride 101 - 111 mmol/L 97(L)  CO2 22 - 32 mmol/L 25  Calcium 8.9 - 10.3 mg/dL 8.3(L)  Total Protein 6.5 - 8.1 g/dL 7.8  Total Bilirubin 0.3 - 1.2 mg/dL 1.0  Alkaline Phos 38 - 126 U/L 120  AST 15 - 41 U/L 32  ALT 14 - 54 U/L 21   CBC Latest Ref Rng & Units 06/09/2017  WBC 3.6 - 11.0 K/uL 8.1  Hemoglobin 12.0 - 16.0 g/dL 11.5(L)  Hematocrit 35.0 - 47.0 % 35.0  Platelets 150 - 440 K/uL 160    No images are attached to the encounter.  Ct Head Wo Contrast  Result Date: 06/09/2017 CLINICAL DATA:  Altered mental status EXAM: CT HEAD WITHOUT CONTRAST TECHNIQUE: Contiguous axial images were obtained from the base of the skull through the vertex without intravenous contrast. COMPARISON:  Head CT  04/21/2016 FINDINGS: Brain: No acute intracranial hemorrhage. No focal mass lesion. No CT evidence of acute infarction. No midline shift or mass effect. No hydrocephalus. Basilar cisterns are patent. Vascular: No hyperdense vessel or unexpected calcification. Skull: Normal. Negative for fracture or focal lesion. Sinuses/Orbits: Paranasal sinuses and mastoid air cells are clear. Orbits are clear. Other: None. IMPRESSION: No acute intracranial findings. Electronically Signed   By: Suzy Bouchard M.D.   On: 06/09/2017 11:18   Ct Abdomen Pelvis W Contrast  Result Date: 06/09/2017 CLINICAL DATA:  Lethargic  and less response of the since last evening. Abdominal pain common nausea, vomiting and diarrhea. EXAM: CT ABDOMEN AND PELVIS WITH CONTRAST TECHNIQUE: Multidetector CT imaging of the abdomen and pelvis was performed using the standard protocol following bolus administration of intravenous contrast. CONTRAST:  143mL ISOVUE-300 IOPAMIDOL (ISOVUE-300) INJECTION 61% COMPARISON:  01/29/2017 FINDINGS: Lower chest: Small right pleural effusion and bibasilar atelectasis. The heart is upper limits of normal in size. No pericardial effusion. Age advanced atherosclerotic calcifications involving the distal descending thoracic aorta. The esophagus is grossly normal. Hepatobiliary: No focal hepatic lesions are identified. Status post cholecystectomy with stable mild intrahepatic and extrahepatic biliary dilatation. Pancreas: No mass, inflammation or ductal dilatation. Spleen: Normal size.  No focal lesions. Adrenals/Urinary Tract: The adrenal glands are normal in stable. Very small kidneys but no hydronephrosis. Stable small enhancing right renal lesions. E largest lesion measures 11 mm. Small scattered  cysts. Stomach/Bowel: The stomach, duodenum, small bowel and colon are grossly normal without oral contrast. Scattered colonic diverticulosis but no findings for acute diverticulitis. Mild rectal wall thickening and enhancement could suggest proctitis. Vascular/Lymphatic: Age advanced atherosclerotic calcifications involving the abdominal aorta and branch vessels including dense calcification of the SMA and IMA. No definite CT findings for ischemic bowel. Small scattered mesenteric and retroperitoneal lymph nodes but no mass or overt adenopathy. Reproductive: Status post hysterectomy. Other: No pelvic mass or adenopathy. No free pelvic fluid collections. No inguinal mass or adenopathy. No abdominal wall hernia or subcutaneous lesions. Musculoskeletal: No significant bony findings. IMPRESSION: 1. Small right pleural effusion and  bibasilar atelectasis. 2. Status post cholecystectomy with mild stable intra and extrahepatic biliary dilatation. 3. Stable small kidneys.  Patient is on dialysis. 4. Stable small enhancing right renal lesions. 5. Mild rectal wall thickening could suggest proctitis. 6. Stable advanced atherosclerotic calcifications involving the aorta and branch vessels most notably the SMA and IMA. Electronically Signed   By: Marijo Sanes M.D.   On: 06/09/2017 11:31     Assessment and plan- Patient is a 56 y.o. female referred for M spike in urine  Patient has been unable to give [place] a 24 hour urine sample due to trasnportation issues. spep shows no M spike. Free light chain ratio normal. She has ESRD and on hemodialysis due to HTn/DM. No hypercalcemia. Hb stable around 11. My suspicion that she has a plasma cell disorder is low. I will therefore see her back in 1 years time with repeat cbc, cmp, myeloma panel and free light chains   Visit Diagnosis 1. Abnormal urine findings      Dr. Randa Evens, MD, MPH [facility] Pager- [phone] 06/11/2017 2:01 PM

## 2017-06-11 NOTE — Progress Notes (Signed)
Patient here for follow up. She is very lethargic today, states she has been experiencing nausea and vomiting for the past two weeks and has been to the ED twice.

## 2017-06-12 NOTE — Progress Notes (Signed)
Recheck b/p 174/62

## 2017-06-16 DIAGNOSIS — I6523 Occlusion and stenosis of bilateral carotid arteries: Secondary | ICD-10-CM | POA: Insufficient documentation

## 2017-06-18 ENCOUNTER — Ambulatory Visit (INDEPENDENT_AMBULATORY_CARE_PROVIDER_SITE_OTHER): Payer: Medicare Other | Admitting: Vascular Surgery

## 2017-06-18 ENCOUNTER — Encounter (INDEPENDENT_AMBULATORY_CARE_PROVIDER_SITE_OTHER): Payer: Self-pay | Admitting: Vascular Surgery

## 2017-06-18 VITALS — BP 147/58 | HR 69 | Resp 17 | Ht 65.5 in | Wt 177.8 lb

## 2017-06-18 DIAGNOSIS — I2510 Atherosclerotic heart disease of native coronary artery without angina pectoris: Secondary | ICD-10-CM

## 2017-06-18 DIAGNOSIS — I10 Essential (primary) hypertension: Secondary | ICD-10-CM | POA: Diagnosis not present

## 2017-06-18 DIAGNOSIS — I771 Stricture of artery: Secondary | ICD-10-CM | POA: Diagnosis not present

## 2017-06-18 DIAGNOSIS — N186 End stage renal disease: Secondary | ICD-10-CM | POA: Diagnosis not present

## 2017-06-18 DIAGNOSIS — I6529 Occlusion and stenosis of unspecified carotid artery: Secondary | ICD-10-CM | POA: Insufficient documentation

## 2017-06-18 DIAGNOSIS — I872 Venous insufficiency (chronic) (peripheral): Secondary | ICD-10-CM

## 2017-06-18 DIAGNOSIS — E1159 Type 2 diabetes mellitus with other circulatory complications: Secondary | ICD-10-CM | POA: Diagnosis not present

## 2017-06-18 DIAGNOSIS — I6523 Occlusion and stenosis of bilateral carotid arteries: Secondary | ICD-10-CM

## 2017-06-18 NOTE — Progress Notes (Signed)
MRN : [id number]  [name] is a 56 y.o. (09/10/1961) female who presents with chief complaint of  Chief Complaint  Patient presents with  . Carotid    ref Nehemiah Massed for carotid stenosis  .  History of Present Illness: The patient is seen for evaluation of carotid stenosis.  The stenosis was identified after she had a syncopal episode. The patient notes it was associated with a low blood sugar of only 30.  There is no recent history of TIA symptoms or focal motor deficits. There is no prior documented CVA.  The patient is taking enteric-coated aspirin 81 mg daily and Plavix 75 mg daily at the time.  There is no history of migraine headaches or prior diagnosis of ocular migraine. There is no history of seizures.  She reports dialysis has been going well.  No recent issues with her left arm access  The patient has a history of coronary artery disease, no recent episodes of angina or shortness of breath. There is a history of hyperlipidemia which is being treated with a statin.   Current Meds  Medication Sig  . acidophilus (RISAQUAD) CAPS capsule Take 1 capsule by mouth daily with lunch.   . albuterol (PROVENTIL HFA;VENTOLIN HFA) 108 (90 Base) MCG/ACT inhaler Inhale 2 puffs into the lungs every 6 (six) hours as needed for shortness of breath.   Marland Kitchen albuterol (PROVENTIL) (2.5 MG/3ML) 0.083% nebulizer solution Take 2.5 mg by nebulization every 6 (six) hours as needed for wheezing or shortness of breath.  Marland Kitchen alum & mag hydroxide-simeth (MAALOX MAX) 400-400-40 MG/5ML suspension Take 5 mLs by mouth every 6 (six) hours as needed for indigestion.  Marland Kitchen amLODipine (NORVASC) 10 MG tablet Take 10 mg by mouth daily.  Marland Kitchen aspirin EC 81 MG tablet Take 81 mg by mouth daily.  Marland Kitchen atorvastatin (LIPITOR) 40 MG tablet Take 20 mg by mouth at bedtime.   . cetirizine (ZYRTEC) 10 MG tablet Take 10 mg by mouth daily.   . clopidogrel (PLAVIX) 75 MG tablet Take 1 tablet (75 mg total) by mouth daily.  . cyanocobalamin  1000 MCG tablet Take 1,000 mcg by mouth daily with lunch.   . diclofenac sodium (VOLTAREN) 1 % GEL Apply 2 g topically 2 (two) times daily as needed (for leg pain).   Marland Kitchen diphenhydrAMINE (BENADRYL) 25 mg capsule Take 25 mg by mouth every 6 (six) hours as needed for itching.  . famotidine (PEPCID) 20 MG tablet Take 1 tablet (20 mg total) by mouth 2 (two) times daily.  Marland Kitchen FLUoxetine (PROZAC) 20 MG capsule Take 1 capsule (20 mg total) by mouth daily. (Patient taking differently: Take 20 mg by mouth at bedtime. )  . glucose 4 GM chewable tablet Chew 3-4 tablets by mouth as needed for low blood sugar (blood sugar less than 60).   . hydrALAZINE (APRESOLINE) 25 MG tablet Take 25 mg by mouth 3 (three) times daily.  Marland Kitchen HYDROcodone-acetaminophen (NORCO/VICODIN) 5-325 MG tablet Take 1 tablet by mouth every 6 (six) hours as needed for moderate pain or severe pain.  . hydrOXYzine (ATARAX/VISTARIL) 25 MG tablet Take 25 mg by mouth daily.  Marland Kitchen LEVEMIR FLEXTOUCH 100 UNIT/ML Pen Inject 4 Units into the skin 2 (two) times daily.   . lipase/protease/amylase (CREON) 12000 UNITS CPEP capsule Take 24,000-36,000 Units by mouth 3 (three) times daily with meals. Take 36000 mg in the morning, 24000 midday & 36000 in the evening  . LORazepam (ATIVAN) 1 MG tablet Take 1 tablet (1 mg  total) by mouth 2 (two) times daily.  . meclizine (ANTIVERT) 25 MG tablet 25 mg  . metoCLOPramide (REGLAN) 10 MG tablet Take 1 tablet (10 mg total) by mouth every 8 (eight) hours as needed for nausea or vomiting.  . multivitamin (RENA-VIT) TABS tablet Take 1 tablet by mouth daily.   . nitroGLYCERIN (NITROSTAT) 0.4 MG SL tablet Place 0.4 mg under the tongue every 5 (five) minutes x 3 doses as needed.  Marland Kitchen NOVOLOG FLEXPEN 100 UNIT/ML FlexPen Inject 4-12 Units into the skin 3 (three) times daily before meals. Per sliding scale  . OLANZapine (ZYPREXA) 5 MG tablet Take 0.5 tablets (2.5 mg total) by mouth at bedtime. (Patient taking differently: Take 5 mg by  mouth at bedtime. )  . omega-3 acid ethyl esters (LOVAZA) 1 g capsule Take 2 g by mouth 2 (two) times daily.   . pantoprazole (PROTONIX) 40 MG tablet Take 1 tablet (40 mg total) by mouth 2 (two) times daily before a meal.  . pregabalin (LYRICA) 25 MG capsule Take 25 mg by mouth daily.  . promethazine (PHENERGAN) 25 MG tablet Take 12.5 mg by mouth every 6 (six) hours as needed for nausea or vomiting.  . ranitidine (ZANTAC) 150 MG tablet Take 150 mg by mouth 2 (two) times daily. In the afternoon  . rifaximin (XIFAXAN) 550 MG TABS tablet Take 550 mg by mouth 2 (two) times daily.  . sevelamer carbonate (RENVELA) 800 MG tablet Take 800 mg by mouth 3 (three) times daily with meals.     Past Medical History:  Diagnosis Date  . Anemia   . Anginal pain ([place])   . Anxiety   . Arthritis   . Asthma   . Broken wrist   . Bronchitis   . chronic diastolic CHF 05/26/91  . COPD (chronic obstructive pulmonary disease) ([place])   . Coronary artery disease    a. cath 2013: stenting to RCA (report not available); b. cath 2014: LM nl, pLAD 40%, mLAD nl, ost LCx 40%, mid LCx nl, pRCA 30% @ site of prior stent, mRCA 50%  . Depression   . Diabetes mellitus without complication ([place])   . Diabetic neuropathy ([place])   . dialysis 2006  . Diverticulosis   . Dizziness   . Dyspnea   . Elevated lipids   . Environmental and seasonal allergies   . ESRD (end stage renal disease) on dialysis ([place])    M-W-F  . GERD (gastroesophageal reflux disease)   . Headache   . History of hiatal hernia   . HOH (hard of hearing)   . Hx of pancreatitis 2015  . Hypertension   . Lower extremity edema   . Mitral regurgitation    a. echo 10/2013: EF 62%, noWMA, mildly dilated LA, mild to mod MR/TR, GR1DD  . Myocardial infarction ([place])   . Orthopnea   . Pneumonia   . Renal cancer ([place])   . Renal insufficiency    Pt is on dialysis on M,W + F.  . Wheezing     Past Surgical History:  Procedure Laterality Date  . ABDOMINAL  HYSTERECTOMY  1992  . APPENDECTOMY    . CARDIAC CATHETERIZATION Left 07/26/2015   Procedure: Left Heart Cath and Coronary Angiography;  Surgeon: Dionisio David, MD;  Location: [facility];  Service: Cardiovascular;  Laterality: Left;  . CATARACT EXTRACTION W/ INTRAOCULAR LENS IMPLANT Right   . CATARACT EXTRACTION W/PHACO Left 03/10/2017   Procedure: CATARACT EXTRACTION PHACO AND INTRAOCULAR LENS PLACEMENT (  [place]);  Surgeon: Birder Robson, MD;  Location: [facility];  Service: Ophthalmology;  Laterality: Left;  [place] 00:51.9 AP% 14.2 CDE 7.39 fluid pack lot # [id number]  . CHOLECYSTECTOMY    . COLONOSCOPY WITH PROPOFOL N/A 08/12/2016   Procedure: COLONOSCOPY WITH PROPOFOL;  Surgeon: Lollie Sails, MD;  Location: [facility] ENDOSCOPY;  Service: Endoscopy;  Laterality: N/A;  . DIALYSIS FISTULA CREATION Left    upper arm  . dialysis grafts    . ESOPHAGOGASTRODUODENOSCOPY N/A 03/08/2015   Procedure: ESOPHAGOGASTRODUODENOSCOPY (EGD);  Surgeon: Manya Silvas, MD;  Location: [facility] ENDOSCOPY;  Service: Endoscopy;  Laterality: N/A;  . ESOPHAGOGASTRODUODENOSCOPY (EGD) WITH PROPOFOL N/A 03/18/2016   Procedure: ESOPHAGOGASTRODUODENOSCOPY (EGD) WITH PROPOFOL;  Surgeon: [name], MD;  Location: [facility] ENDOSCOPY;  Service: Endoscopy;  Laterality: N/A;  . EYE SURGERY Right 2018  . FECAL TRANSPLANT N/A 08/23/2015   Procedure: FECAL TRANSPLANT;  Surgeon: Manya Silvas, MD;  Location: [facility] ENDOSCOPY;  Service: Endoscopy;  Laterality: N/A;  . HAND SURGERY Bilateral   . PERIPHERAL VASCULAR CATHETERIZATION N/A 12/20/2015   Procedure: Thrombectomy of dialysis access versus permcath placement;  Surgeon: Algernon Huxley, MD;  Location: [place] [facility];  Service: Cardiovascular;  Laterality: N/A;  . PERIPHERAL VASCULAR CATHETERIZATION N/A 12/20/2015   Procedure: A/V Shunt Intervention;  Surgeon: Algernon Huxley, MD;  Location: [place] [facility];  Service: Cardiovascular;  Laterality: N/A;  . PERIPHERAL VASCULAR  CATHETERIZATION N/A 12/20/2015   Procedure: A/V Shuntogram/Fistulagram;  Surgeon: Algernon Huxley, MD;  Location: [place] [facility];  Service: Cardiovascular;  Laterality: N/A;  . PERIPHERAL VASCULAR CATHETERIZATION N/A 01/02/2016   Procedure: A/V Shuntogram/Fistulagram;  Surgeon: Algernon Huxley, MD;  Location: [place] [facility];  Service: Cardiovascular;  Laterality: N/A;  . PERIPHERAL VASCULAR CATHETERIZATION N/A 01/02/2016   Procedure: A/V Shunt Intervention;  Surgeon: Algernon Huxley, MD;  Location: [place];  Service: Cardiovascular;  Laterality: N/A;    Social History Social History  Substance Use Topics  . Smoking status: Former Smoker    Packs/day: 0.50    Years: 40.00    Types: Cigarettes    Quit date: 02/13/2015  . Smokeless tobacco: Never Used  . Alcohol use 0.6 oz/week    1 Glasses of wine per week     Comment: glass wine week per pt    Family History Family History  Problem Relation Age of Onset  . Kidney disease Mother   . Diabetes Mother   . Cancer Father   . Kidney disease Sister     Allergies  Allergen Reactions  . Ace Inhibitors Swelling and Anaphylaxis  . Ativan [Lorazepam] Other (See Comments)    Reaction:Hallucinations and headaches Reaction:Hallucinations and headaches Reaction:  Hallucinations and headaches  . Compazine [Prochlorperazine Edisylate] Anaphylaxis and Nausea And Vomiting    Other reaction(s): dystonia from this vs. reglan 23 Jul - patient relates that she takes promethazine frequently with no problems. 23 Jul - patient relates that she takes promethazine frequently with no problems.  . Sumatriptan Succinate     Other reaction(s): delirium and hallucinations per [facility] records  . Dilaudid [Hydromorphone Hcl] Other (See Comments)    Delirium   . Ondansetron Other (See Comments)    hallucinations    . Zofran [Ondansetron Hcl] Other (See Comments)    Reaction:  hallucinations  Reaction:  hallucinations   . Codeine Nausea And  Vomiting  . Gabapentin Other (See Comments)    Reaction:  Unknown    .  Lac Bovis Nausea And Vomiting  . Losartan Nausea Only  . Oxycodone Anxiety  . Prochlorperazine Other (See Comments)    Reaction:  Unknown . Patient does not remember reaction but she does have vertigo and anxiety along with n and v at times. Could be used to treat any of these   . Reglan [Metoclopramide]     Per patient her Dr. Evelina Bucy her off it  Per patient her Dr. Evelina Bucy her off it   . Scopolamine Other (See Comments)    Dizziness, also has vertigo already  . Tape Rash  . Tapentadol Rash     REVIEW OF SYSTEMS (Negative unless checked)  Constitutional: [] Weight loss  [] Fever  [] Chills Cardiac: [] Chest pain   [] Chest pressure   [] Palpitations   [] Shortness of breath when laying flat   [] Shortness of breath with exertion. Vascular:  [] Pain in legs with walking   [] Pain in legs at rest  [] History of DVT   [] Phlebitis   [] Swelling in legs   [] Varicose veins   [] Non-healing ulcers Pulmonary:   [] Uses home oxygen   [] Productive cough   [] Hemoptysis   [] Wheeze  [] COPD   [] Asthma Neurologic:  [x] Dizziness   [] Seizures   [] History of stroke   [] History of TIA  [] Aphasia   [] Vissual changes   [] Weakness or numbness in arm   [] Weakness or numbness in leg Musculoskeletal:   [] Joint swelling   [x] Joint pain   [] Low back pain Hematologic:  [] Easy bruising  [] Easy bleeding   [] Hypercoagulable state   [] Anemic Gastrointestinal:  [] Diarrhea   [] Vomiting  [] Gastroesophageal reflux/heartburn   [] Difficulty swallowing. Genitourinary:  [x] Chronic kidney disease   [] Difficult urination  [] Frequent urination   [] Blood in urine Skin:  [] Rashes   [] Ulcers  Psychological:  [] History of anxiety   []  History of major depression.  Physical Examination  Vitals:   06/18/17 0945  BP: (!) 147/58  Pulse: 69  Resp: 17  Weight: 177 lb 12.8 oz (80.6 kg)  Height: 5' 5.5" (1.664 m)   Body mass index is 29.14 kg/m. Gen: WD/WN, NAD Head:  [place]/AT, No temporalis wasting.  Ear/Nose/Throat: Hearing grossly intact, nares w/o erythema or drainage Eyes: PER, EOMI, sclera nonicteric.  Neck: Supple, no large masses.   Pulmonary:  Good air movement, no audible wheezing bilaterally, no use of accessory muscles.  Cardiac: RRR, no JVD; 3/5 SEM Vascular:  Bilateral carotid bruit; Left brachial axillary AV graft good thrill good bruit Vessel Right Left  Radial Palpable Cast  Ulnar Palpable Cast   Brachial Palpable Palpable  Carotid Palpable Palpable  Gastrointestinal: Non-distended. No guarding/no peritoneal signs.  Musculoskeletal: M/S 5/5 throughout.  Left wrist in a cast  Neurologic: CN 2-12 intact. Symmetrical.  Speech is fluent. Motor exam as listed above. Psychiatric: Judgment intact, Mood & affect appropriate for pt's clinical situation. Dermatologic: No rashes or ulcers noted.  No changes consistent with cellulitis. Lymph : No lichenification or skin changes of chronic lymphedema.  CBC Lab Results  Component Value Date   WBC 8.1 06/09/2017   HGB 11.5 (L) 06/09/2017   HCT 35.0 06/09/2017   MCV 92.1 06/09/2017   PLT 160 06/09/2017    BMET    Component Value Date/Time   NA 140 06/09/2017 1025   NA 135 (L) 09/15/2014 0948   K 4.8 06/09/2017 1025   K 4.8 09/15/2014 0948   CL 97 (L) 06/09/2017 1025   CL 99 09/15/2014 0948   CO2 25 06/09/2017 1025   CO2 26  09/15/2014 0948   GLUCOSE 166 (H) 06/09/2017 1025   GLUCOSE 118 (H) 09/15/2014 0948   BUN 52 (H) 06/09/2017 1025   BUN 19 (H) 09/15/2014 0948   CREATININE 10.06 (H) 06/09/2017 1025   CREATININE 6.79 (H) 09/15/2014 0948   CALCIUM 8.3 (L) 06/09/2017 1025   CALCIUM 8.3 (L) 09/15/2014 0948   GFRNONAA 4 (L) 06/09/2017 1025   GFRNONAA 7 (L) 09/15/2014 0948   GFRNONAA 6 (L) 05/31/2014 0432   GFRAA 4 (L) 06/09/2017 1025   GFRAA 8 (L) 09/15/2014 0948   GFRAA 7 (L) 05/31/2014 0432   Estimated Creatinine Clearance: 6.5 mL/min (A) (by C-G formula based on SCr of 10.06  mg/dL (H)).  COAG Lab Results  Component Value Date   INR 1.08 04/21/2016   INR 0.97 12/28/2015   INR 1.01 07/26/2015    Radiology Ct Head Wo Contrast  Result Date: 06/09/2017 CLINICAL DATA:  Altered mental status EXAM: CT HEAD WITHOUT CONTRAST TECHNIQUE: Contiguous axial images were obtained from the base of the skull through the vertex without intravenous contrast. COMPARISON:  Head CT  04/21/2016 FINDINGS: Brain: No acute intracranial hemorrhage. No focal mass lesion. No CT evidence of acute infarction. No midline shift or mass effect. No hydrocephalus. Basilar cisterns are patent. Vascular: No hyperdense vessel or unexpected calcification. Skull: Normal. Negative for fracture or focal lesion. Sinuses/Orbits: Paranasal sinuses and mastoid air cells are clear. Orbits are clear. Other: None. IMPRESSION: No acute intracranial findings. Electronically Signed   By: Suzy Bouchard M.D.   On: 06/09/2017 11:18   Ct Abdomen Pelvis W Contrast  Result Date: 06/09/2017 CLINICAL DATA:  Lethargic and less response of the since last evening. Abdominal pain common nausea, vomiting and diarrhea. EXAM: CT ABDOMEN AND PELVIS WITH CONTRAST TECHNIQUE: Multidetector CT imaging of the abdomen and pelvis was performed using the standard protocol following bolus administration of intravenous contrast. CONTRAST:  118mL ISOVUE-300 IOPAMIDOL (ISOVUE-300) INJECTION 61% COMPARISON:  01/29/2017 FINDINGS: Lower chest: Small right pleural effusion and bibasilar atelectasis. The heart is upper limits of normal in size. No pericardial effusion. Age advanced atherosclerotic calcifications involving the distal descending thoracic aorta. The esophagus is grossly normal. Hepatobiliary: No focal hepatic lesions are identified. Status post cholecystectomy with stable mild intrahepatic and extrahepatic biliary dilatation. Pancreas: No mass, inflammation or ductal dilatation. Spleen: Normal size.  No focal lesions. Adrenals/Urinary  Tract: The adrenal glands are normal in stable. Very small kidneys but no hydronephrosis. Stable small enhancing right renal lesions. E largest lesion measures 11 mm. Small scattered cysts. Stomach/Bowel: The stomach, duodenum, small bowel and colon are grossly normal without oral contrast. Scattered colonic diverticulosis but no findings for acute diverticulitis. Mild rectal wall thickening and enhancement could suggest proctitis. Vascular/Lymphatic: Age advanced atherosclerotic calcifications involving the abdominal aorta and branch vessels including dense calcification of the SMA and IMA. No definite CT findings for ischemic bowel. Small scattered mesenteric and retroperitoneal lymph nodes but no mass or overt adenopathy. Reproductive: Status post hysterectomy. Other: No pelvic mass or adenopathy. No free pelvic fluid collections. No inguinal mass or adenopathy. No abdominal wall hernia or subcutaneous lesions. Musculoskeletal: No significant bony findings. IMPRESSION: 1. Small right pleural effusion and bibasilar atelectasis. 2. Status post cholecystectomy with mild stable intra and extrahepatic biliary dilatation. 3. Stable small kidneys.  Patient is on dialysis. 4. Stable small enhancing right renal lesions. 5. Mild rectal wall thickening could suggest proctitis. 6. Stable advanced atherosclerotic calcifications involving the aorta and branch vessels most notably the SMA and IMA.  Electronically Signed   By: Marijo Sanes M.D.   On: 06/09/2017 11:31     Assessment/Plan 1. Bilateral carotid artery stenosis The patient remains asymptomatic with respect to the carotid stenosis.  However, the patient has now progressed and has a lesion the is >70%.  Patient should undergo CT angiography of the carotid arteries to define the degree of stenosis of the internal carotid arteries bilaterally and the anatomic suitability for surgery vs. intervention.  If the patient does indeed need surgery cardiac clearance  will be required, once cleared the patient will be scheduled for surgery.  The risks, benefits and alternative therapies were reviewed in detail with the patient.  All questions were answered.  The patient agrees to proceed with imaging.  Continue antiplatelet therapy as prescribed. Continue management of CAD, HTN and Hyperlipidemia. Healthy heart diet, encouraged exercise at least 4 times per week.    2. ESRD (end stage renal disease) ([place]) Recommend:  The patient is doing well and currently has adequate dialysis access. The patient's dialysis center is not reporting any access issues. Flow pattern is stable when compared to the prior ultrasound.  The patient should have a duplex ultrasound of the dialysis access in 6 months.  The patient will follow-up with me in the office after each ultrasound   3. Type 2 diabetes mellitus with other circulatory complication, unspecified whether long term insulin use (HCC) Continue hypoglycemic medications as already ordered, these medications have been reviewed and there are no changes at this time.  Hgb A1C to be monitored as already arranged by primary service   4. Essential hypertension Continue antihypertensive medications as already ordered, these medications have been reviewed and there are no changes at this time.   5. Coronary artery disease involving native coronary artery of native heart without angina pectoris Continue cardiac and antihypertensive medications as already ordered and reviewed, no changes at this time.  Continue statin as ordered and reviewed, no changes at this time  Nitrates PRN for chest pain   6. Chronic venous insufficiency No surgery or intervention at this point in time.    I have had a long discussion with the patient regarding venous insufficiency and why it  causes symptoms. I have discussed with the patient the chronic skin changes that accompany venous insufficiency and the long term sequela such as  infection and ulceration.  Patient will begin wearing graduated compression stockings class 1 (20-30 mmHg) or compression wraps on a daily basis a prescription was given. The patient will put the stockings on first thing in the morning and removing them in the evening. The patient is instructed specifically not to sleep in the stockings.    In addition, behavioral modification including several periods of elevation of the lower extremities during the day will be continued. I have demonstrated that proper elevation is a position with the ankles at heart level.  The patient is instructed to begin routine exercise, especially walking on a daily basis  Following the review of the ultrasound the patient will follow up  to reassess the degree of swelling and the control that graduated compression stockings or compression wraps  is offering.   The patient can be assessed for a Lymph Pump at that time  7. Stricture of artery ([place]) See #1 - CT Angio Neck W/Cm &/Or Wo/Cm; Future    Hortencia Pilar, MD  06/18/2017 10:06 AM

## 2017-06-27 IMAGING — CR DG FOREARM 2V*L*
1 series · 2 of 2 positions shown · non-contrast
Comparison: None.

CLINICAL DATA: Extremity pain and swelling

EXAM:
LEFT FOREARM - 2 VIEW

[Series 1: dg forearm left · 0.14mm/px · 2 of 2 slices shown]
[im 1/2]
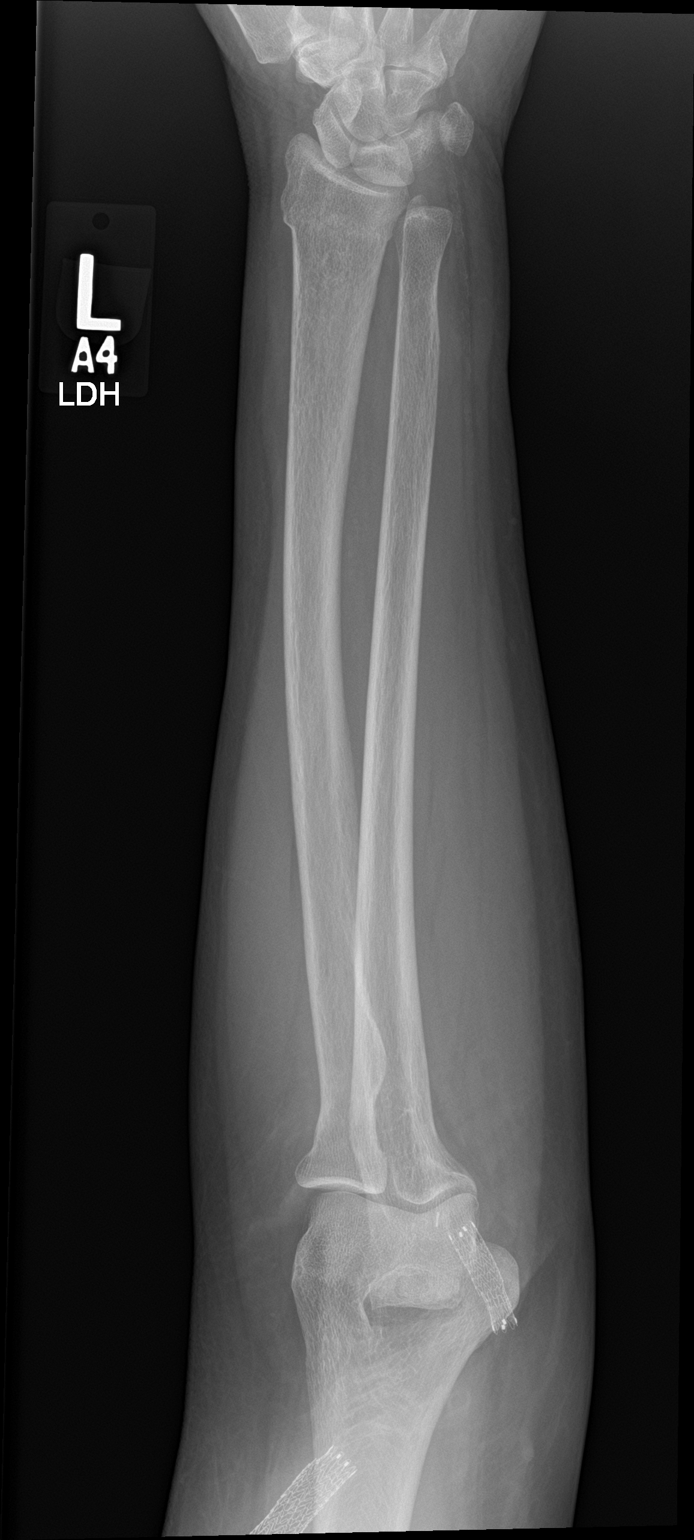
[im 2/2]
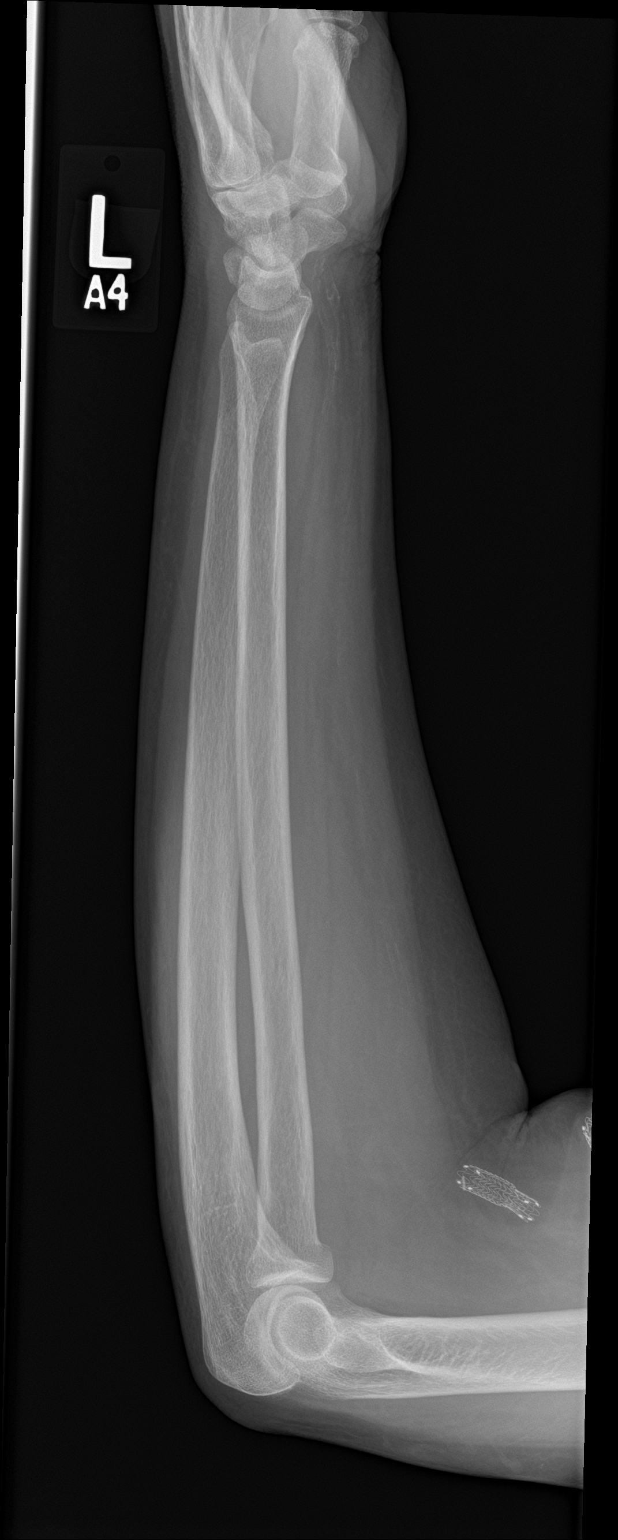

[2 of 2 positions shown; findings below may reference images not displayed]

FINDINGS: There is a nondisplaced mildly impacted fracture of the distal
radial metaphysis. There is no other fracture or dislocation. Soft
tissues are unremarkable.
IMPRESSION: Nondisplaced mildly impacted fracture of the distal radial
metaphysis.

## 2017-06-27 IMAGING — CR DG WRIST COMPLETE 3+V*L*
1 series · 4 of 4 positions shown · non-contrast
Comparison: None.

CLINICAL DATA: Left wrist pain.

EXAM:
LEFT WRIST - COMPLETE 3+ VIEW

[Series 1: dg wrist complete left · 0.14mm/px · 4 of 4 slices shown]
[im 1/4]
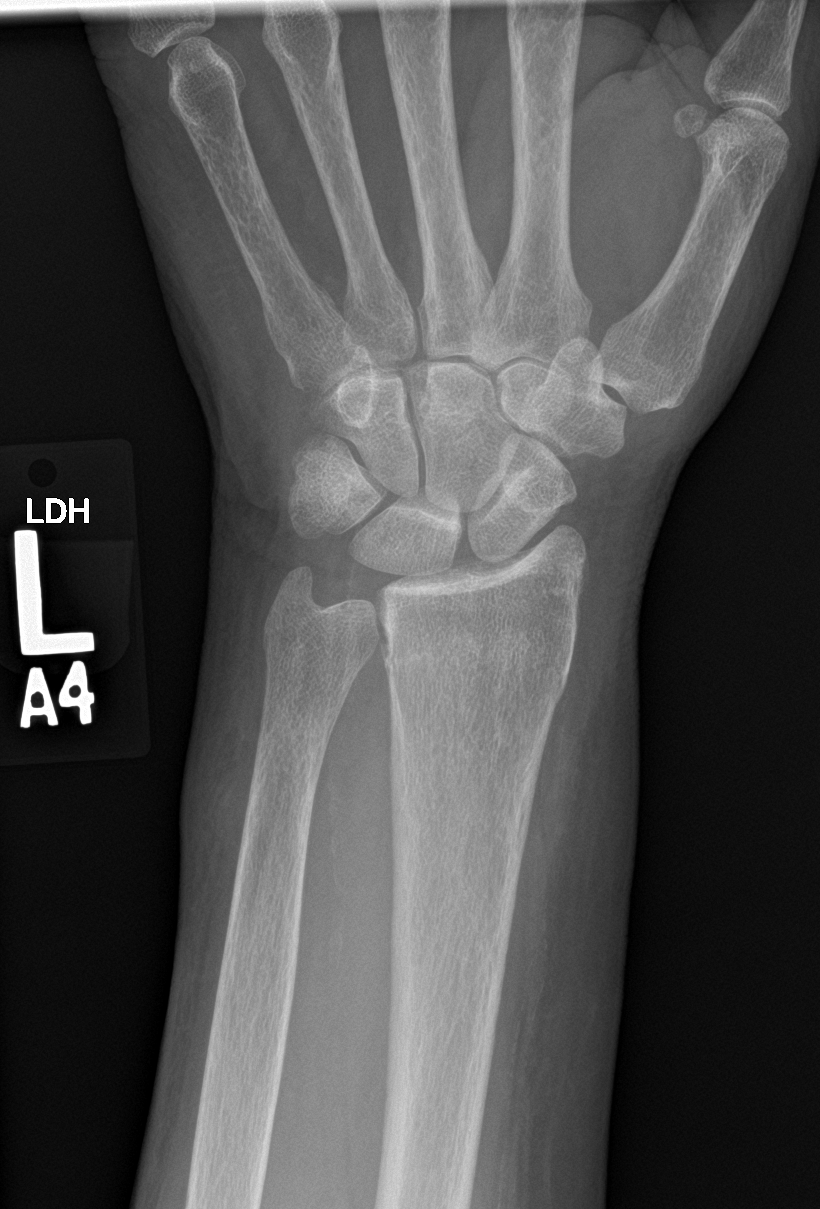
[im 2/4]
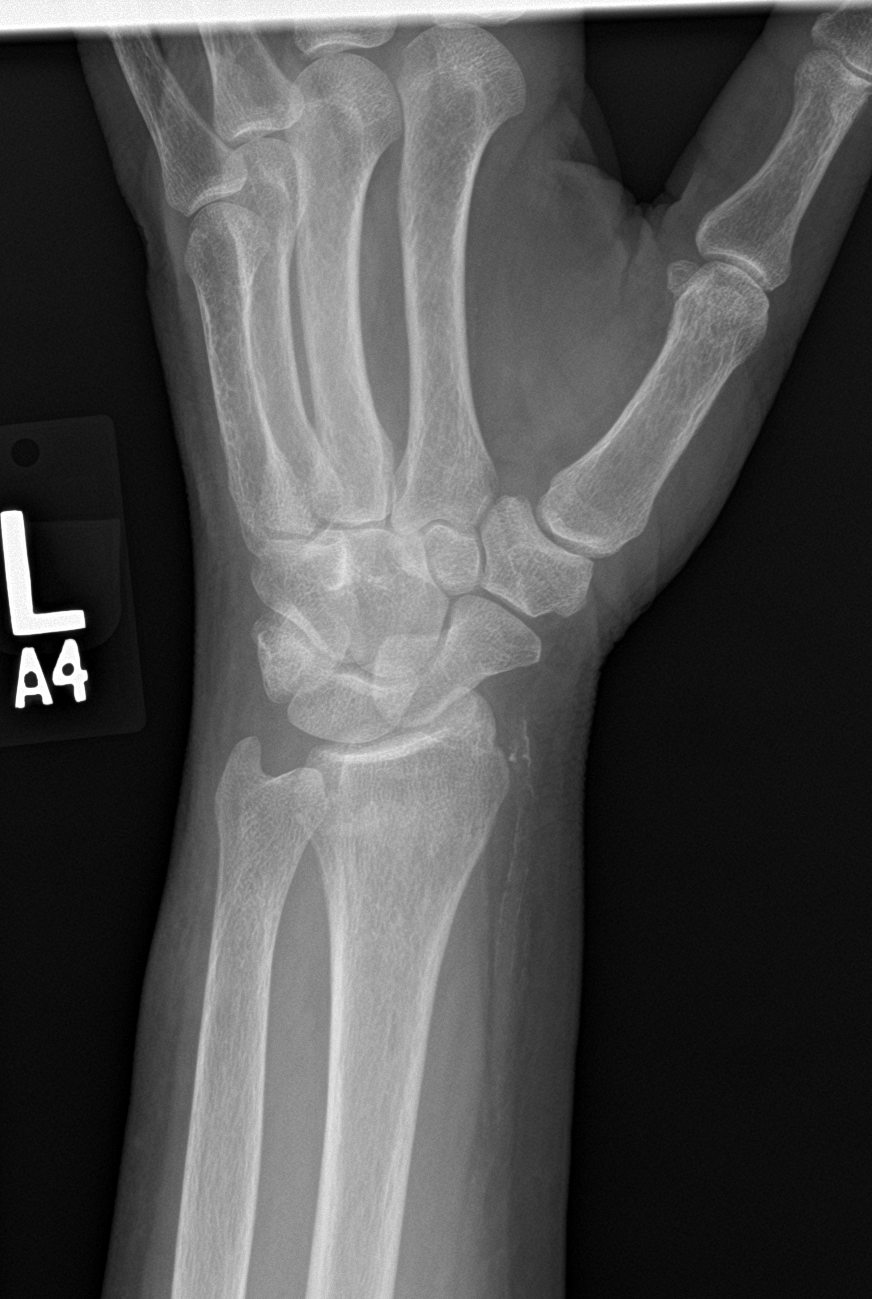
[im 3/4]
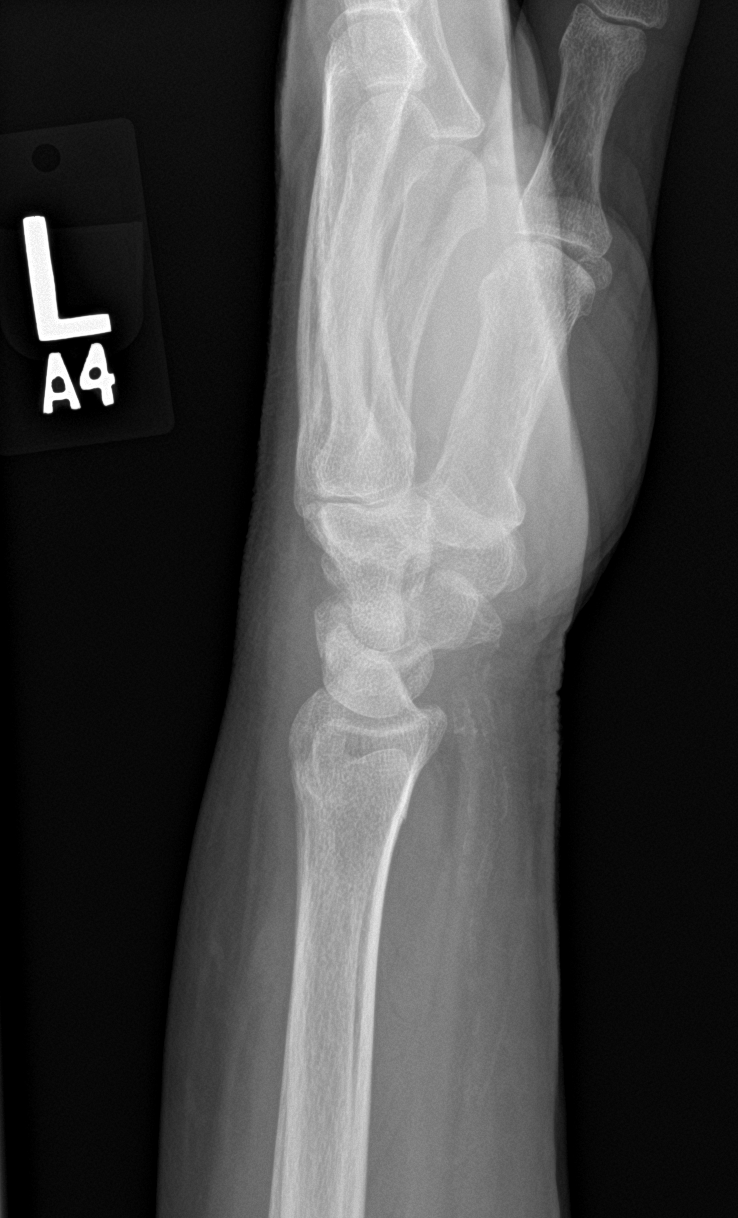
[im 4/4]
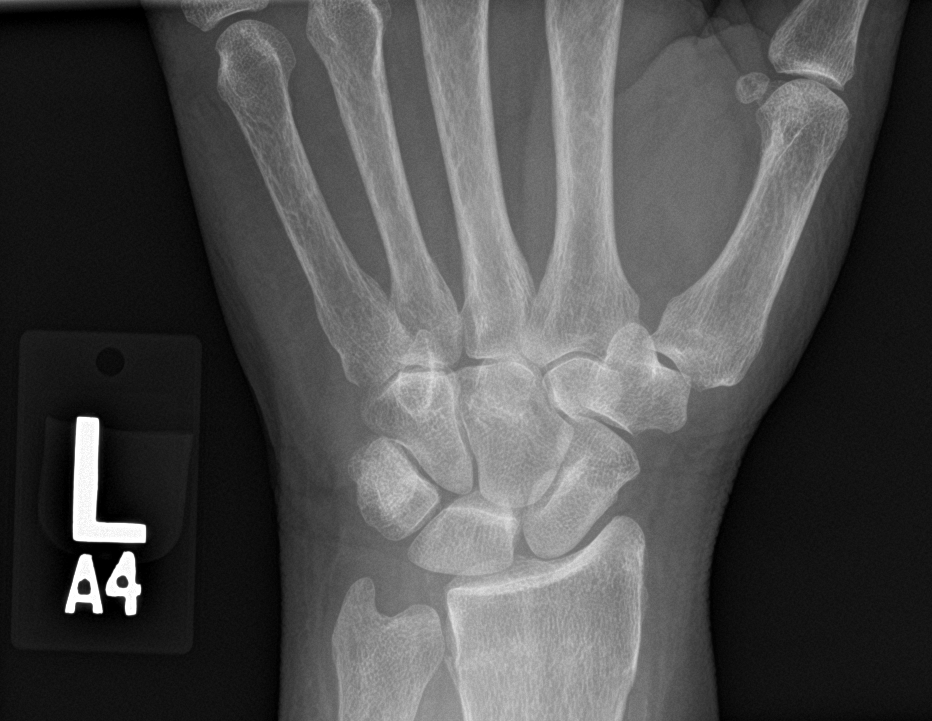

[4 of 4 positions shown; findings below may reference images not displayed]

FINDINGS: There is a nondisplaced mildly impacted fracture of the distal
radial metaphysis. There is no other fracture or dislocation.
Alignment is anatomic. No soft tissue abnormality.
IMPRESSION: Nondisplaced mildly impacted fracture of the distal radial
metaphysis.

## 2017-07-02 ENCOUNTER — Ambulatory Visit
Admission: RE | Admit: 2017-07-02 | Discharge: 2017-07-02 | Disposition: A | Discharge status: Home / Self Care | Payer: Medicare Other | Source: Ambulatory Visit | Attending: Vascular Surgery | Admitting: Vascular Surgery

## 2017-07-02 DIAGNOSIS — I771 Stricture of artery: Secondary | ICD-10-CM

## 2017-07-02 DIAGNOSIS — I6523 Occlusion and stenosis of bilateral carotid arteries: Secondary | ICD-10-CM | POA: Insufficient documentation

## 2017-07-02 MED ORDER — IOPAMIDOL (ISOVUE-370) INJECTION 76%
75.0000 mL | Freq: Once | INTRAVENOUS | Status: AC | PRN
  Administered 2017-07-02: 75 mL via INTRAVENOUS

## 2017-07-14 ENCOUNTER — Ambulatory Visit
Admission: RE | Admit: 2017-07-14 | Discharge: 2017-07-14 | Disposition: A | Discharge status: Home / Self Care | Payer: Medicare Other | Source: Ambulatory Visit | Attending: Orthopaedic Surgery | Admitting: Orthopaedic Surgery

## 2017-07-14 DIAGNOSIS — S52552D Other extraarticular fracture of lower end of left radius, subsequent encounter for closed fracture with routine healing: Secondary | ICD-10-CM | POA: Diagnosis present

## 2017-07-14 DIAGNOSIS — X58XXXD Exposure to other specified factors, subsequent encounter: Secondary | ICD-10-CM | POA: Diagnosis not present

## 2017-07-14 DIAGNOSIS — S52612A Displaced fracture of left ulna styloid process, initial encounter for closed fracture: Secondary | ICD-10-CM | POA: Insufficient documentation

## 2017-07-14 DIAGNOSIS — X58XXXA Exposure to other specified factors, initial encounter: Secondary | ICD-10-CM | POA: Insufficient documentation

## 2017-07-21 ENCOUNTER — Ambulatory Visit (INDEPENDENT_AMBULATORY_CARE_PROVIDER_SITE_OTHER): Payer: Medicare Other | Admitting: Vascular Surgery

## 2017-07-21 ENCOUNTER — Other Ambulatory Visit: Payer: Self-pay | Admitting: Student

## 2017-07-21 DIAGNOSIS — R1310 Dysphagia, unspecified: Secondary | ICD-10-CM

## 2017-07-28 ENCOUNTER — Ambulatory Visit (INDEPENDENT_AMBULATORY_CARE_PROVIDER_SITE_OTHER): Payer: Medicare Other | Admitting: Vascular Surgery

## 2017-07-28 ENCOUNTER — Ambulatory Visit: Admission: RE | Admit: 2017-07-28 | Payer: Medicare Other | Source: Ambulatory Visit

## 2017-07-29 IMAGING — CR DG CHEST 2V
1 series · 2 of 2 positions shown · non-contrast
Comparison: December 05, 2016

CLINICAL DATA: Chest pain and nausea

EXAM:
CHEST  2 VIEW

[Series 1: dg chest 2 view · 0.14mm/px · 2 of 2 slices shown]
[im 1/2]
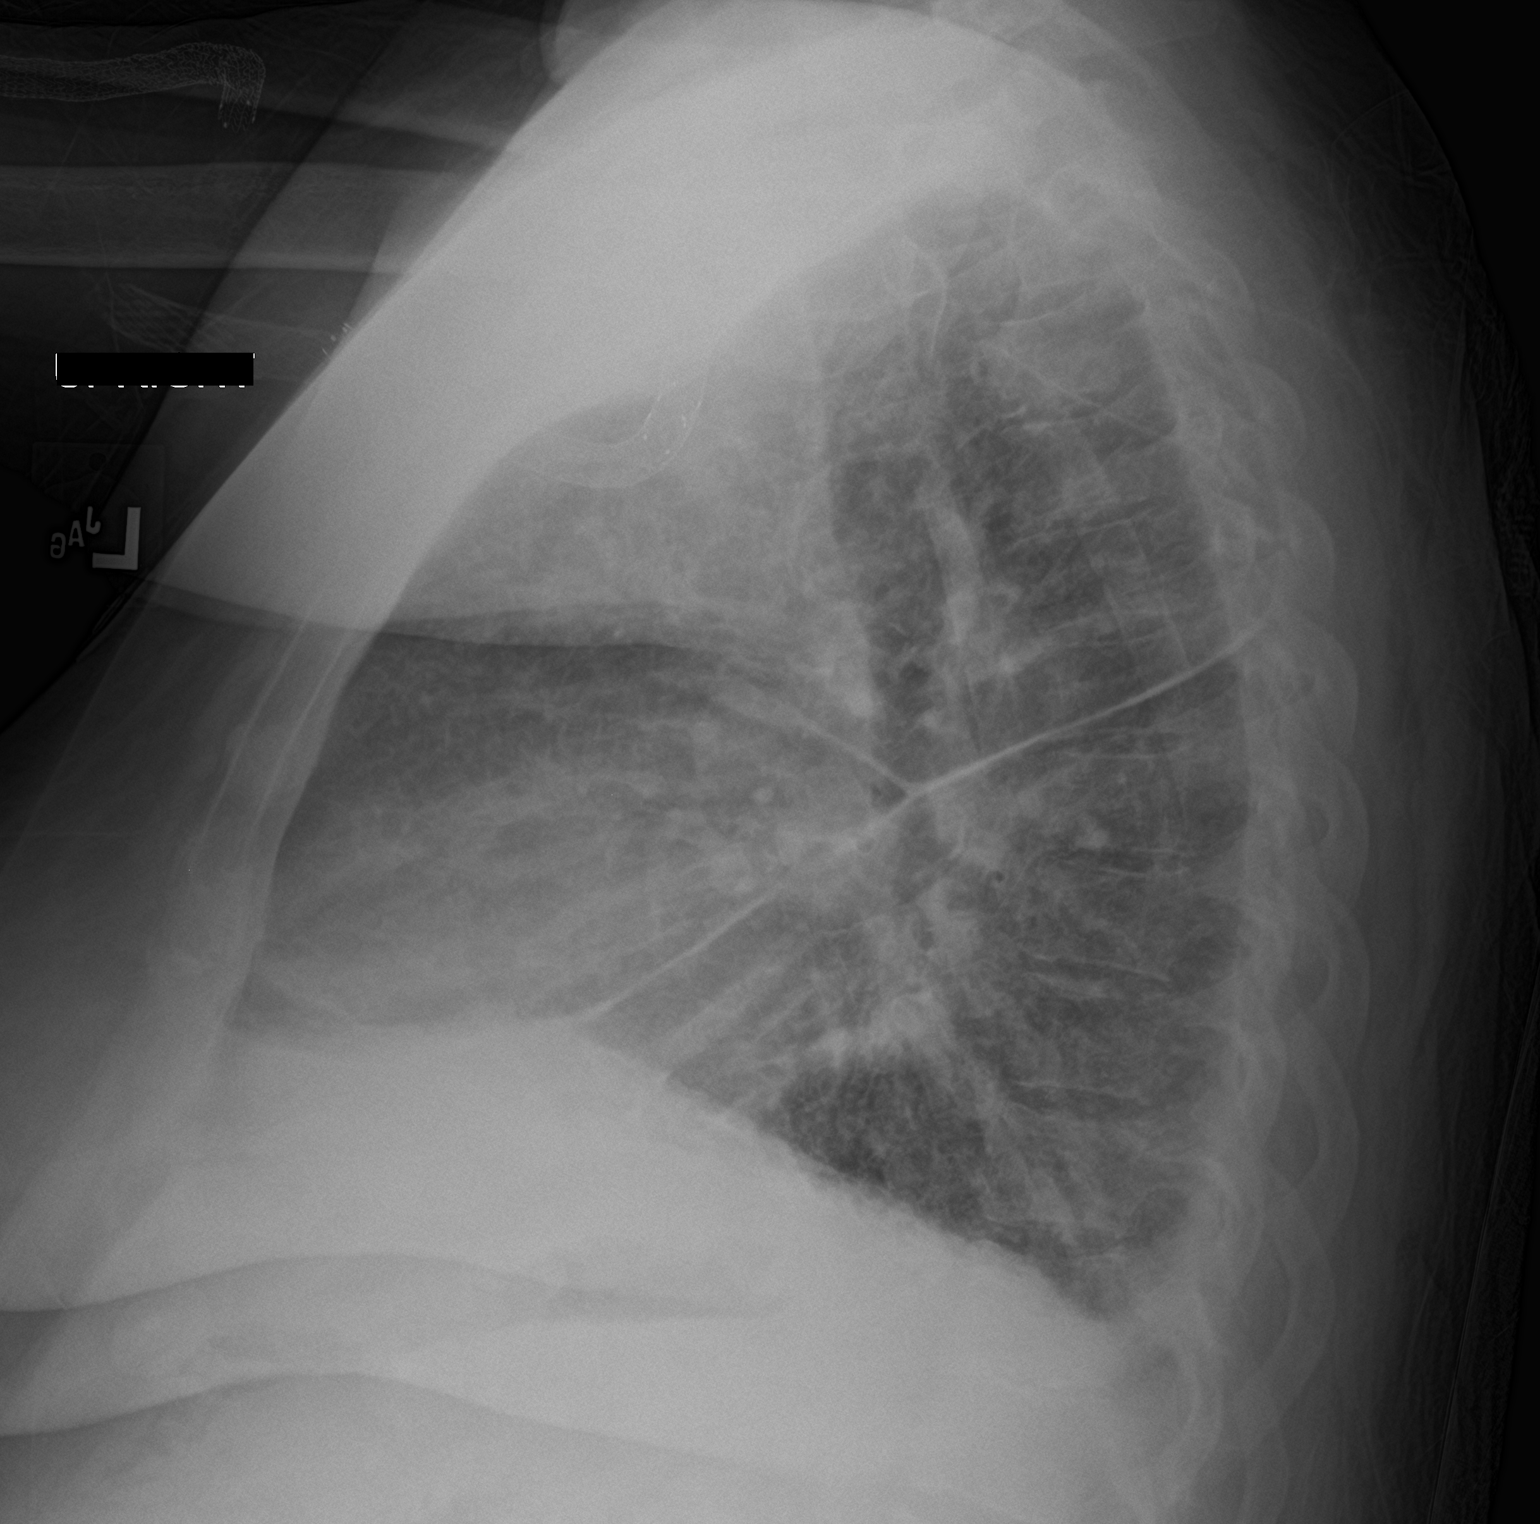
[im 2/2]
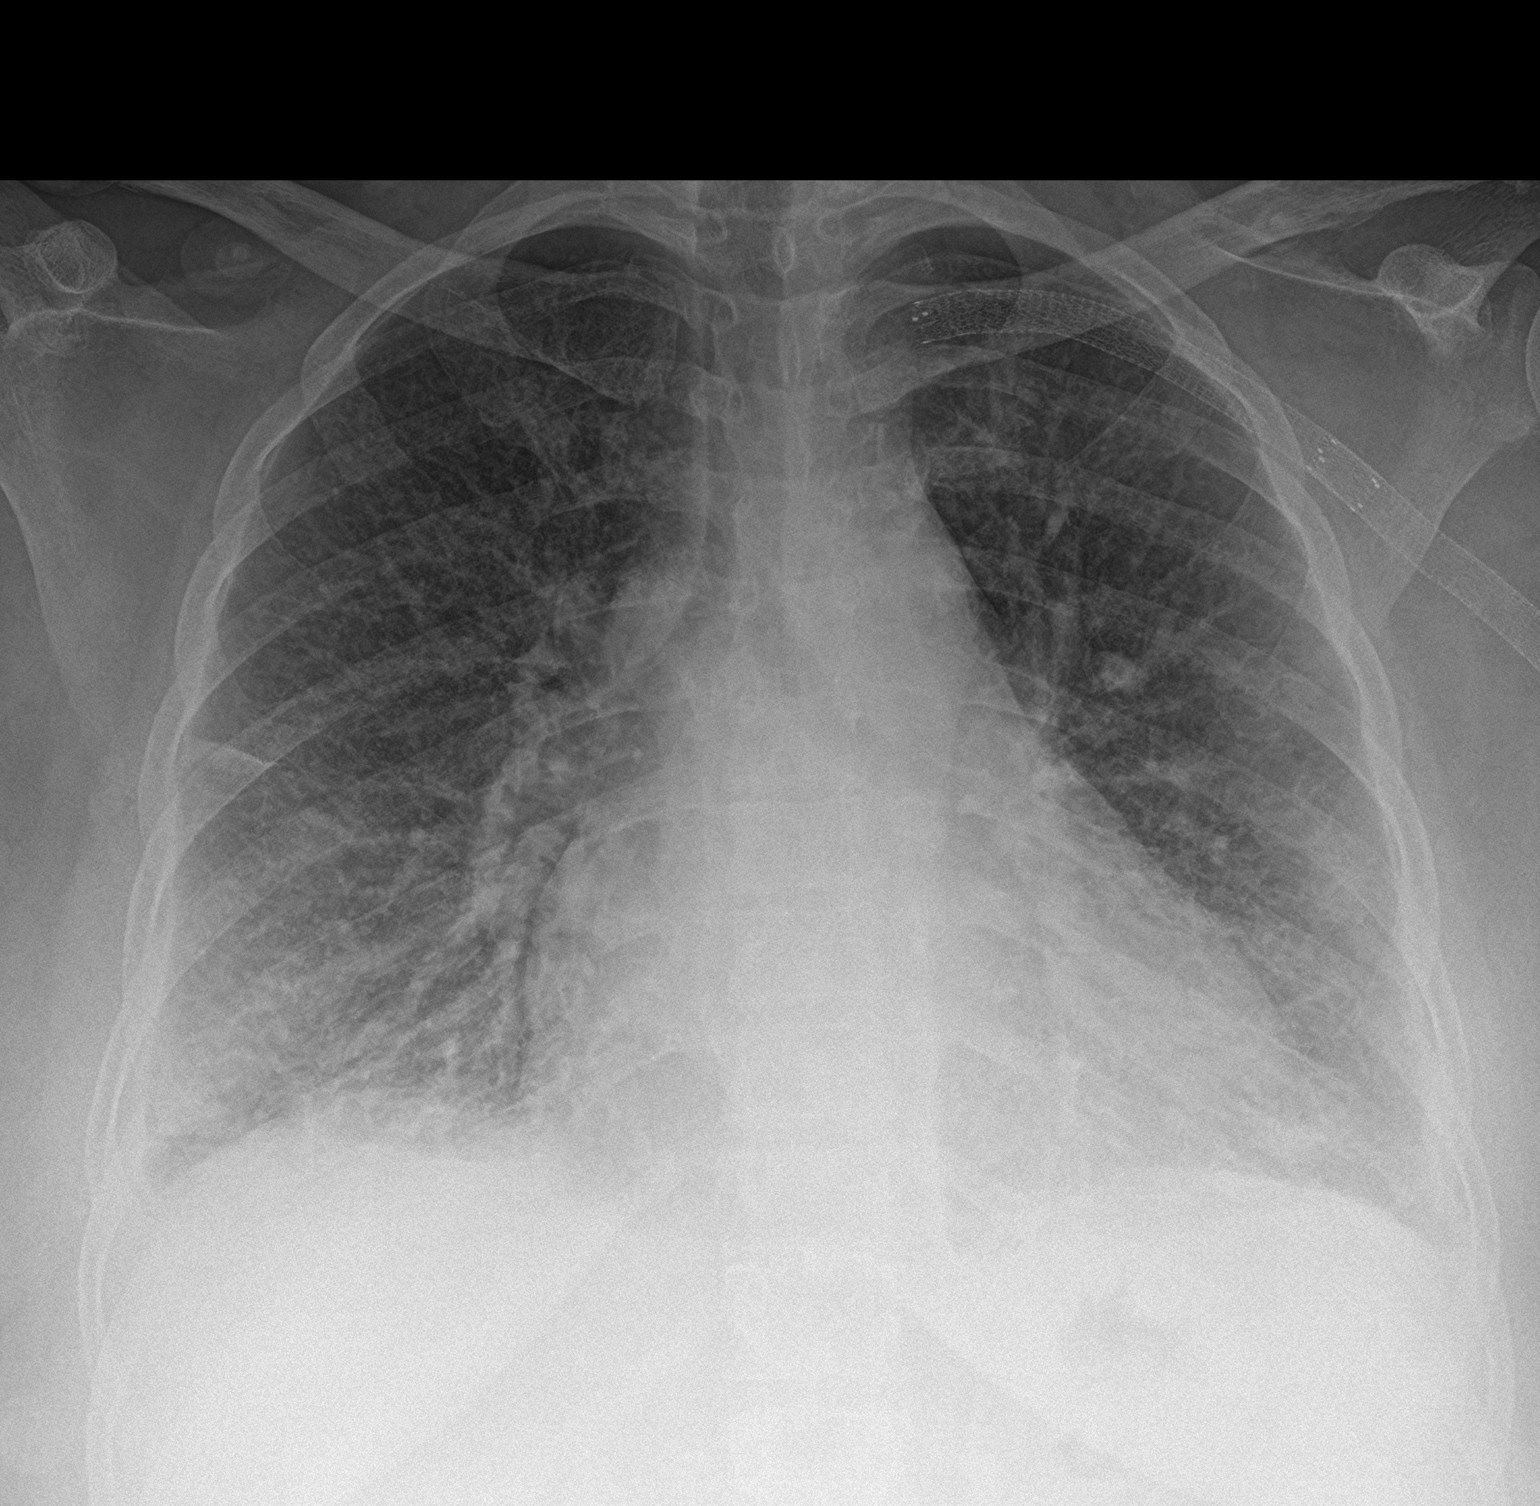

[2 of 2 positions shown; findings below may reference images not displayed]

FINDINGS: There is generalized interstitial prominence, likely interstitial
pulmonary edema. There is a small right pleural effusion. There is
cardiomegaly with mild pulmonary venous hypertension. No adenopathy.
There is a stent in the left subclavian and axillary regions,
stable.
IMPRESSION: Findings felt to most likely represent chronic congestive heart
failure. It should be noted that underlying interstitial prominence
from underlying interstitial disease including atypical pneumonia
superimposed on apparent chronic congestive heart failure cannot be
excluded radiographically. Appearance is similar to recent study. No
airspace consolidation present.

## 2017-07-30 ENCOUNTER — Ambulatory Visit
Admission: RE | Admit: 2017-07-30 | Discharge: 2017-07-30 | Disposition: A | Discharge status: Home / Self Care | Payer: Medicare Other | Source: Ambulatory Visit | Attending: Student | Admitting: Student

## 2017-07-30 DIAGNOSIS — R1310 Dysphagia, unspecified: Secondary | ICD-10-CM | POA: Diagnosis present

## 2017-08-04 ENCOUNTER — Ambulatory Visit (INDEPENDENT_AMBULATORY_CARE_PROVIDER_SITE_OTHER): Payer: Medicare Other | Admitting: Vascular Surgery

## 2017-08-07 IMAGING — DX DG CHEST 1V PORT
1 series · 2 of 2 positions shown · non-contrast
Comparison: 03/23/2017 and CT chest 02/17/2016.

CLINICAL DATA: Right upper quadrant pain.

EXAM:
PORTABLE CHEST 1 VIEW

[Series 1: chest ap · 0.14mm/px · 2 of 2 slices shown]
[im 1/2]
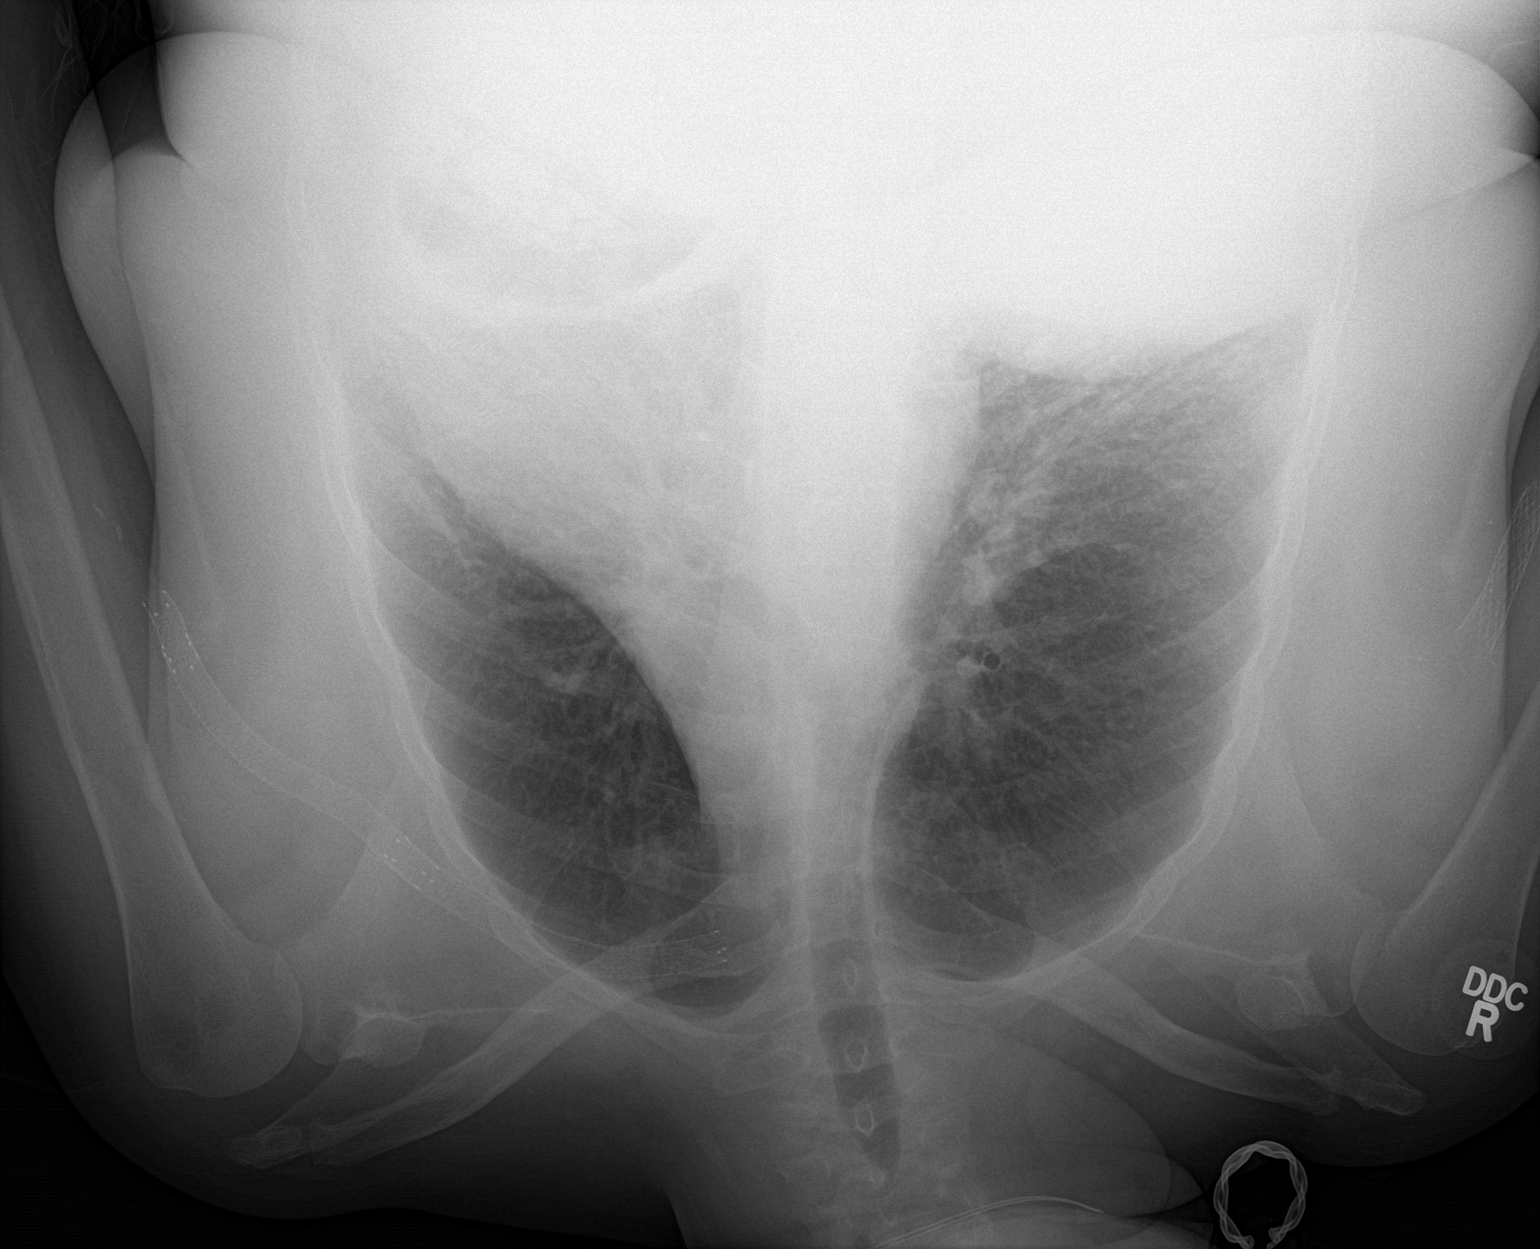
[im 2/2]
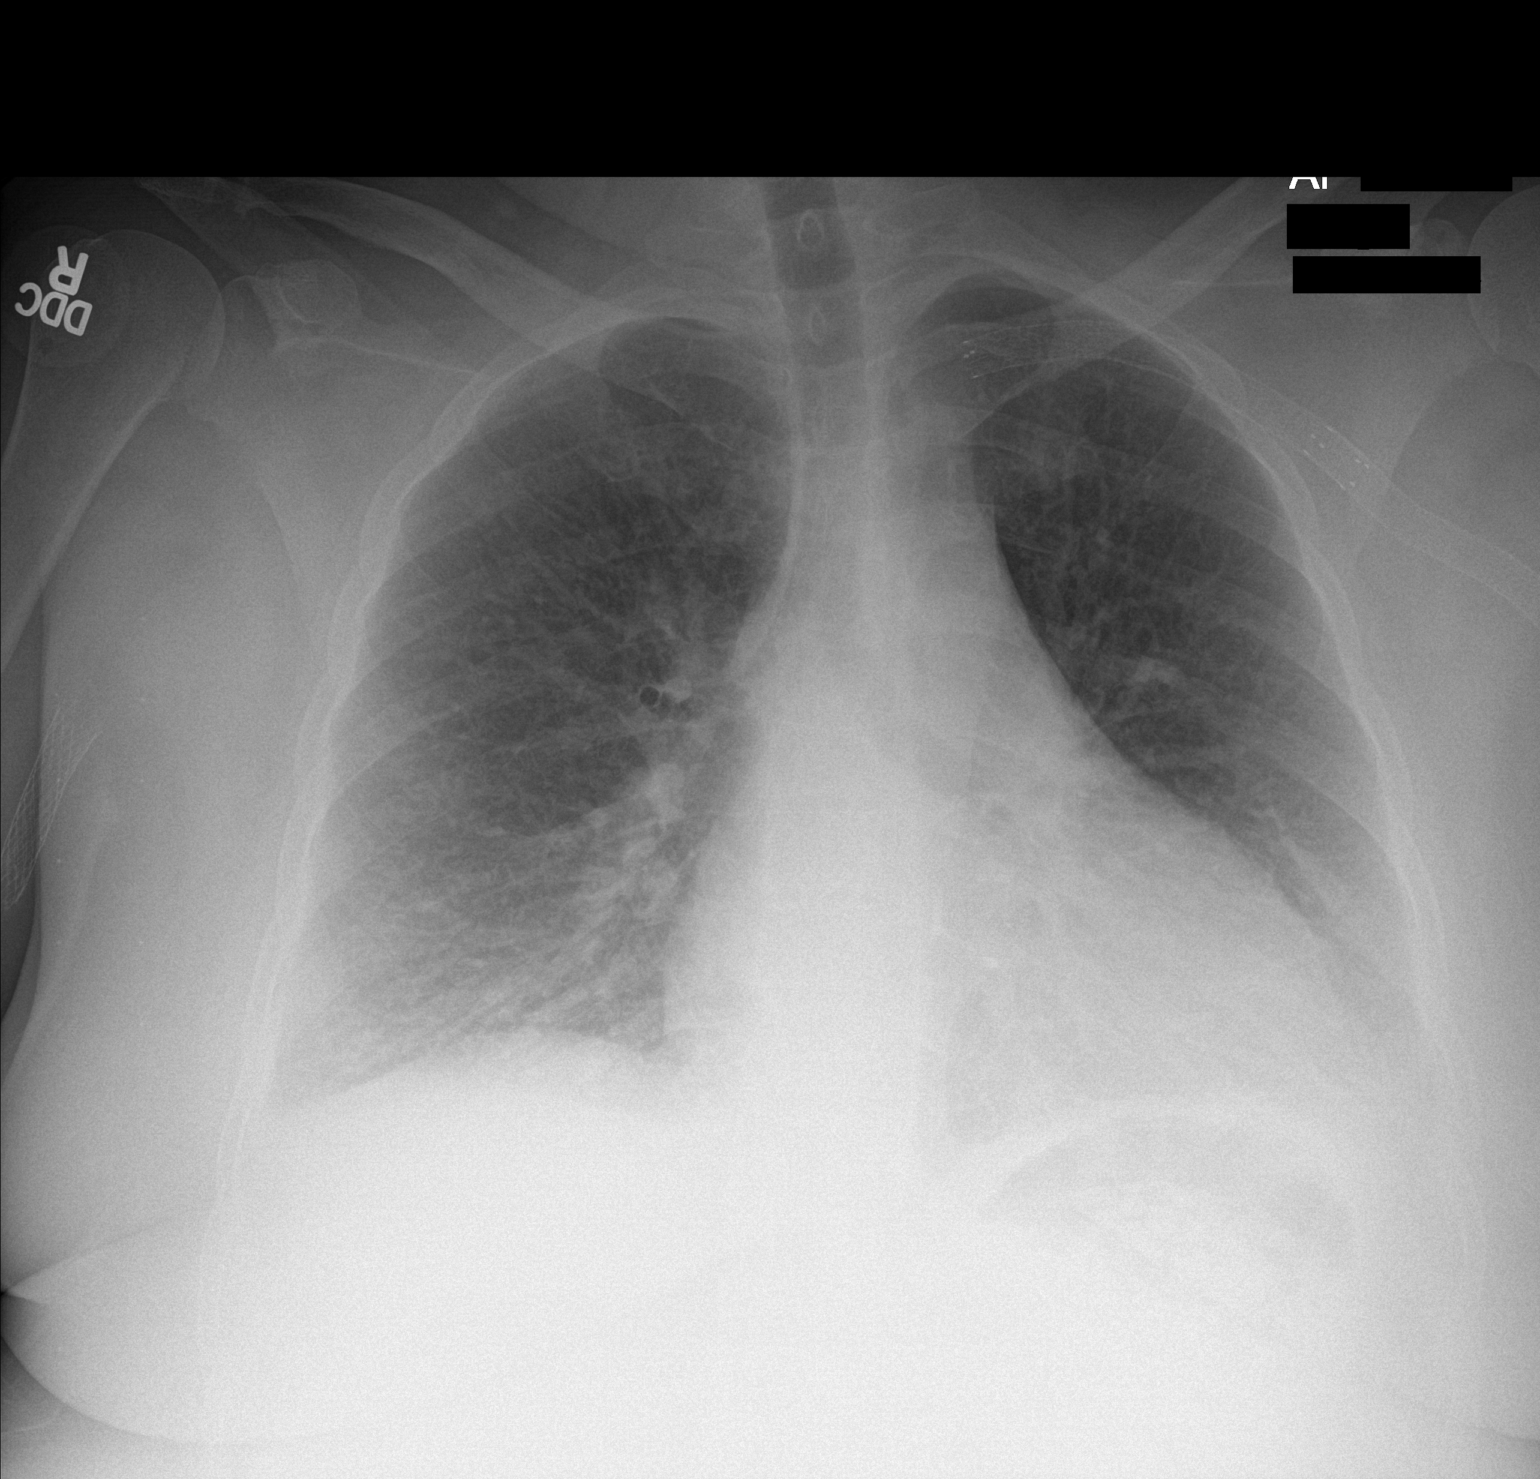

[2 of 2 positions shown; findings below may reference images not displayed]

FINDINGS: Trachea is midline. Heart size stable. Resolved or nearly completely
resolved bilateral pleural effusions. Vascular stent in the left
axilla.
IMPRESSION: Improving congestive heart failure.

## 2017-08-14 ENCOUNTER — Other Ambulatory Visit: Payer: Self-pay

## 2017-08-14 ENCOUNTER — Emergency Department: Payer: Medicare Other

## 2017-08-14 ENCOUNTER — Emergency Department
Admission: EM | Admit: 2017-08-14 | Discharge: 2017-08-14 | Disposition: A | Discharge status: Home / Self Care | Payer: Medicare Other | Attending: Emergency Medicine | Admitting: Emergency Medicine

## 2017-08-14 DIAGNOSIS — N186 End stage renal disease: Secondary | ICD-10-CM | POA: Diagnosis not present

## 2017-08-14 DIAGNOSIS — I2510 Atherosclerotic heart disease of native coronary artery without angina pectoris: Secondary | ICD-10-CM | POA: Diagnosis not present

## 2017-08-14 DIAGNOSIS — I5032 Chronic diastolic (congestive) heart failure: Secondary | ICD-10-CM | POA: Insufficient documentation

## 2017-08-14 DIAGNOSIS — Z992 Dependence on renal dialysis: Secondary | ICD-10-CM | POA: Insufficient documentation

## 2017-08-14 DIAGNOSIS — R51 Headache: Secondary | ICD-10-CM | POA: Diagnosis present

## 2017-08-14 DIAGNOSIS — M129 Arthropathy, unspecified: Secondary | ICD-10-CM | POA: Diagnosis not present

## 2017-08-14 DIAGNOSIS — M25511 Pain in right shoulder: Secondary | ICD-10-CM | POA: Diagnosis not present

## 2017-08-14 DIAGNOSIS — R519 Headache, unspecified: Secondary | ICD-10-CM

## 2017-08-14 DIAGNOSIS — J449 Chronic obstructive pulmonary disease, unspecified: Secondary | ICD-10-CM | POA: Insufficient documentation

## 2017-08-14 DIAGNOSIS — E1140 Type 2 diabetes mellitus with diabetic neuropathy, unspecified: Secondary | ICD-10-CM | POA: Diagnosis not present

## 2017-08-14 DIAGNOSIS — I132 Hypertensive heart and chronic kidney disease with heart failure and with stage 5 chronic kidney disease, or end stage renal disease: Secondary | ICD-10-CM | POA: Insufficient documentation

## 2017-08-14 DIAGNOSIS — Z87891 Personal history of nicotine dependence: Secondary | ICD-10-CM | POA: Insufficient documentation

## 2017-08-14 DIAGNOSIS — I252 Old myocardial infarction: Secondary | ICD-10-CM | POA: Diagnosis not present

## 2017-08-14 DIAGNOSIS — Z8673 Personal history of transient ischemic attack (TIA), and cerebral infarction without residual deficits: Secondary | ICD-10-CM | POA: Insufficient documentation

## 2017-08-14 MED ORDER — HYDROMORPHONE HCL 1 MG/ML IJ SOLN: 1.0000 mg | Freq: Once | INTRAMUSCULAR | Status: DC

## 2017-08-14 MED ORDER — HYDROMORPHONE HCL 1 MG/ML IJ SOLN
1.0000 mg | Freq: Once | INTRAMUSCULAR | Status: AC
  Administered 2017-08-14: 1 mg via INTRAMUSCULAR
  Filled 2017-08-14: qty 1

## 2017-08-14 NOTE — ED Provider Notes (Signed)
[facility] Emergency Department Provider Note       Time seen: ----------------------------------------- 1:39 PM on 08/14/2017 -----------------------------------------   I have reviewed the triage vital signs and the nursing notes.  HISTORY   Chief Complaint Arm Pain and Headache    HPI [name] is a 56 y.o. female with a history of end-stage renal disease on dialysis who presents to the ED for severe right arm pain and headache.  Patient describes pain that is 10 out of 10 in the right arm head.  Patient states pain has persisted for the last 48-72 hours since her last dialysis episode.  She arrives with the left arm AV fistula still accessed.  She denies any other injuries or complaints.  Past Medical History:  Diagnosis Date  . Anemia   . Anginal pain ([place])   . Anxiety   . Arthritis   . Asthma   . Broken wrist   . Bronchitis   . chronic diastolic CHF 01/08/6221  . COPD (chronic obstructive pulmonary disease) ([place])   . Coronary artery disease    a. cath 2013: stenting to RCA (report not available); b. cath 2014: LM nl, pLAD 40%, mLAD nl, ost LCx 40%, mid LCx nl, pRCA 30% @ site of prior stent, mRCA 50%  . Depression   . Diabetes mellitus without complication ([place])   . Diabetic neuropathy ([place])   . dialysis 2006  . Diverticulosis   . Dizziness   . Dyspnea   . Elevated lipids   . Environmental and seasonal allergies   . ESRD (end stage renal disease) on dialysis ([place])    M-W-F  . GERD (gastroesophageal reflux disease)   . Headache   . History of hiatal hernia   . HOH (hard of hearing)   . Hx of pancreatitis 2015  . Hypertension   . Lower extremity edema   . Mitral regurgitation    a. echo 10/2013: EF 62%, noWMA, mildly dilated LA, mild to mod MR/TR, GR1DD  . Myocardial infarction ([place])   . Orthopnea   . Pneumonia   . Renal cancer ([place])   . Renal insufficiency    Pt is on dialysis on M,W + F.  . Wheezing     Patient Active  Problem List   Diagnosis Date Noted  . Carotid stenosis 06/18/2017  . Shortness of breath 05/04/2017  . Cellulitis of lower extremity 07/29/2016  . Chronic venous insufficiency 07/29/2016  . Lymphedema 07/29/2016  . TIA (transient ischemic attack) 04/21/2016  . Altered mental status 04/08/2016  . Hyperammonemia ([place]) 04/08/2016  . Elevated troponin 04/08/2016  . ESRD (end stage renal disease) ([place]) 04/08/2016  . Depression 04/08/2016  . Depression, major, recurrent, severe with psychosis ([place]) 04/08/2016  . Blood in stool   . Intractable cyclical vomiting with nausea   . Reflux esophagitis   . Gastritis   . Generalized abdominal pain   . Uncontrollable vomiting   . Major depressive disorder, recurrent episode, moderate ([place]) 03/15/2016  . Adjustment disorder with mixed anxiety and depressed mood 03/15/2016  . Malnutrition of moderate degree 12/01/2015  . Renal mass   . Dyspnea   . Acute renal failure ([place])   . Respiratory failure ([place])   . High temperature 11/14/2015  . Pulmonary edema   . Encounter for central line placement   . Encounter for orogastric (OG) tube placement   . Nausea 11/12/2015  . Hyperkalemia 10/03/2015  . Diarrhea, unspecified 07/22/2015  . Pneumonia 05/21/2015  .  Hypoglycemia 04/24/2015  . Unresponsiveness 04/24/2015  . Bradycardia 04/24/2015  . Hypothermia 04/24/2015  . Acute respiratory failure ([place]) 04/24/2015  . Acute diastolic CHF (congestive heart failure) ([place]) 04/05/2015  . Diabetic gastroparesis ([place]) 04/05/2015  . Hypokalemia 04/05/2015  . Generalized weakness 04/05/2015  . Acute pulmonary edema ([place]) 04/03/2015  . Nausea and vomiting 04/03/2015  . Hypoglycemia associated with diabetes ([place]) 04/03/2015  . Anemia of chronic disease 04/03/2015  . Secondary hyperparathyroidism ([place]) 04/03/2015  . Pressure ulcer 04/02/2015  . Acute respiratory failure with hypoxia ([place]) 04/01/2015  . Adjustment disorder with anxiety 03/14/2015  . Somatic  symptom disorder, mild 03/08/2015  . Coronary artery disease involving native coronary artery of native heart without angina pectoris   . Nausea & vomiting 03/06/2015  . Abdominal pain 03/06/2015  . DM (diabetes mellitus) ([place]) 03/06/2015  . HTN (hypertension) 03/06/2015  . Gastroparesis 02/24/2015  . Pleural effusion 02/19/2015  . HCAP (healthcare-associated pneumonia) 02/19/2015  . End-stage renal disease on hemodialysis ([place]) 02/19/2015    Past Surgical History:  Procedure Laterality Date  . ABDOMINAL HYSTERECTOMY  1992  . APPENDECTOMY    . CARDIAC CATHETERIZATION Left 07/26/2015   Procedure: Left Heart Cath and Coronary Angiography;  Surgeon: Dionisio David, MD;  Location: [place] [facility];  Service: Cardiovascular;  Laterality: Left;  . CATARACT EXTRACTION W/ INTRAOCULAR LENS IMPLANT Right   . CATARACT EXTRACTION W/PHACO Left 03/10/2017   Procedure: CATARACT EXTRACTION PHACO AND INTRAOCULAR LENS PLACEMENT (IOC);  Surgeon: Birder Robson, MD;  Location: [facility] ORS;  Service: Ophthalmology;  Laterality: Left;  [place] 00:51.9 AP% 14.2 CDE 7.39 fluid pack lot # [id number]  . CHOLECYSTECTOMY    . COLONOSCOPY WITH PROPOFOL N/A 08/12/2016   Procedure: COLONOSCOPY WITH PROPOFOL;  Surgeon: Lollie Sails, MD;  Location: [facility] ENDOSCOPY;  Service: Endoscopy;  Laterality: N/A;  . DIALYSIS FISTULA CREATION Left    upper arm  . dialysis grafts    . ESOPHAGOGASTRODUODENOSCOPY N/A 03/08/2015   Procedure: ESOPHAGOGASTRODUODENOSCOPY (EGD);  Surgeon: Manya Silvas, MD;  Location: [facility] ENDOSCOPY;  Service: Endoscopy;  Laterality: N/A;  . ESOPHAGOGASTRODUODENOSCOPY (EGD) WITH PROPOFOL N/A 03/18/2016   Procedure: ESOPHAGOGASTRODUODENOSCOPY (EGD) WITH PROPOFOL;  Surgeon: [name], MD;  Location: [facility] ENDOSCOPY;  Service: Endoscopy;  Laterality: N/A;  . EYE SURGERY Right 2018  . FECAL TRANSPLANT N/A 08/23/2015   Procedure: FECAL TRANSPLANT;  Surgeon: Manya Silvas, MD;  Location[facility]  ENDOSCOPY;  Service: Endoscopy;  Laterality: N/A;  . HAND SURGERY Bilateral   . PERIPHERAL VASCULAR CATHETERIZATION N/A 12/20/2015   Procedure: Thrombectomy of dialysis access versus permcath placement;  Surgeon: Algernon Huxley, MD;  Location: [place] [facility];  Service: Cardiovascular;  Laterality: N/A;  . PERIPHERAL VASCULAR CATHETERIZATION N/A 12/20/2015   Procedure: A/V Shunt Intervention;  Surgeon: Algernon Huxley, MD;  Location: [place] [facility];  Service: Cardiovascular;  Laterality: N/A;  . PERIPHERAL VASCULAR CATHETERIZATION N/A 12/20/2015   Procedure: A/V Shuntogram/Fistulagram;  Surgeon: Algernon Huxley, MD;  Location: [place] [facility];  Service: Cardiovascular;  Laterality: N/A;  . PERIPHERAL VASCULAR CATHETERIZATION N/A 01/02/2016   Procedure: A/V Shuntogram/Fistulagram;  Surgeon: Algernon Huxley, MD;  Location: [place] [facility];  Service: Cardiovascular;  Laterality: N/A;  . PERIPHERAL VASCULAR CATHETERIZATION N/A 01/02/2016   Procedure: A/V Shunt Intervention;  Surgeon: Algernon Huxley, MD;  Location: [place] [facility];  Service: Cardiovascular;  Laterality: N/A;    Allergies Ace inhibitors; Ativan [lorazepam]; Compazine [prochlorperazine edisylate]; Sumatriptan succinate; Dilaudid [hydromorphone  hcl]; Ondansetron; Zofran [ondansetron hcl]; Codeine; Gabapentin; [place] bov[place]; Losartan; Oxycodone; Prochlorperazine; Reglan [metoclopramide]; Scopolamine; Tape; and Tapentadol  Social History Social History   Tobacco Use  . Smoking status: Former Smoker    Packs/day: 0.50    Years: 40.00    Pack years: 20.00    Types: Cigarettes    Last attempt to quit: 02/13/2015    Years since quitting: 2.5  . Smokeless tobacco: Never Used  Substance Use Topics  . Alcohol use: Yes    Alcohol/week: 0.6 oz    Types: 1 Glasses of wine per week    Comment: glass wine week per pt  . Drug use: No    Review of Systems Constitutional: Negative for fever. Cardiovascular: Negative for chest  pain. Respiratory: Negative for shortness of breath. Gastrointestinal: Negative for abdominal pain, vomiting and diarrhea. Genitourinary: Negative for dysuria. Musculoskeletal: Positive for right arm pain Skin: Negative for rash. Neurological: Positive for headache  All systems negative/normal/unremarkable except as stated in the HPI  ____________________________________________   PHYSICAL EXAM:  VITAL SIGNS: ED Triage Vitals  Enc Vitals Group     BP      Pulse      Resp      Temp      Temp src      SpO2      Weight      Height      Head Circumference      Peak Flow      Pain Score      Pain Loc      Pain Edu?      Excl. in [place]?     Constitutional: Alert and oriented.  Mild distress Eyes: Conjunctivae are normal. Normal extraocular movements. ENT   Head: Normocephalic and atraumatic.   Nose: No congestion/rhinnorhea.   Mouth/Throat: Mucous membranes are moist.   Neck: No stridor. Cardiovascular: Normal rate, regular rhythm. No murmurs, rubs, or gallops. Respiratory: Normal respiratory effort without tachypnea nor retractions. Breath sounds are clear and equal bilaterally. No wheezes/rales/rhonchi. Gastrointestinal: Soft and nontender. Normal bowel sounds Musculoskeletal: Tenderness in and around the right shoulder and upper arm.  Normal brachial, radial and ulnar pulses in the right arm Neurologic:  Normal speech and language. No gross focal neurologic deficits are appreciated.  Skin:  Skin is warm, dry and intact. No rash noted. Psychiatric: Depressed mood and affect ____________________________________________  ED COURSE:  Pertinent labs & imaging results that were available during my care of the patient were reviewed by me and considered in my medical decision making (see chart for details). Patient presents for arm pain and headache, we will assess with labs and imaging as indicated.   Procedures ____________________________________________    RADIOLOGY Images were viewed by me  IMPRESSION: No acute abnormality.  Mild acromioclavicular osteoarthritis. IMPRESSION: No acute intracranial abnormality.  ____________________________________________  DIFFERENTIAL DIAGNOSIS   Tension headache, dialysis disequilibrium syndrome, dehydration, electrolyte abnormality, musculoskeletal pain, arthritis, ischemia  FINAL ASSESSMENT AND PLAN  Headache, arm pain, arthritis   Plan: Patient had presented for shoulder pain and headache after dialysis. Patient's imaging was reassuring and she is feeling much better.  I do not appreciate a vascular component to her symptoms.  She appears stable for outpatient follow-up.   Earleen Newport, MD   Note: This note was generated in part or whole with voice recognition software. Voice recognition is usually quite accurate but there are transcription errors that can and very often do occur. I apologize for any typographical errors that  were not detected and corrected.     Earleen Newport, MD 08/14/17 [phone]

## 2017-08-14 NOTE — ED Notes (Addendum)
Pt was at dialysis today and the pain in right arm became worse with right sided headache - pt usually has 3 hours of dialysis tx but today received 2 hours 10 min of tx - pt remains accessed for dialysis - dialysis notified that pt needs to be deaccessed

## 2017-08-14 NOTE — ED Triage Notes (Signed)
Pt brought by ems from dialysis for c/o right arm pain and headache since last dialysis tx Tuesday

## 2017-08-14 NOTE — ED Notes (Signed)
Pt has poor access for IV - Dr Jimmye Norman plans to attempt EJ

## 2017-09-03 ENCOUNTER — Other Ambulatory Visit: Payer: Self-pay

## 2017-09-03 ENCOUNTER — Encounter: Payer: Self-pay | Admitting: *Deleted

## 2017-09-03 ENCOUNTER — Emergency Department: Payer: Medicare Other

## 2017-09-03 ENCOUNTER — Observation Stay
Admission: EM | Admit: 2017-09-03 | Discharge: 2017-09-05 | Disposition: A | Discharge status: Home / Self Care | Payer: Medicare Other | Attending: Internal Medicine | Admitting: Internal Medicine

## 2017-09-03 DIAGNOSIS — I132 Hypertensive heart and chronic kidney disease with heart failure and with stage 5 chronic kidney disease, or end stage renal disease: Secondary | ICD-10-CM | POA: Insufficient documentation

## 2017-09-03 DIAGNOSIS — I2510 Atherosclerotic heart disease of native coronary artery without angina pectoris: Secondary | ICD-10-CM | POA: Diagnosis present

## 2017-09-03 DIAGNOSIS — Z9049 Acquired absence of other specified parts of digestive tract: Secondary | ICD-10-CM | POA: Diagnosis not present

## 2017-09-03 DIAGNOSIS — E1122 Type 2 diabetes mellitus with diabetic chronic kidney disease: Secondary | ICD-10-CM | POA: Diagnosis not present

## 2017-09-03 DIAGNOSIS — I5032 Chronic diastolic (congestive) heart failure: Secondary | ICD-10-CM | POA: Diagnosis not present

## 2017-09-03 DIAGNOSIS — Z794 Long term (current) use of insulin: Secondary | ICD-10-CM | POA: Diagnosis not present

## 2017-09-03 DIAGNOSIS — I340 Nonrheumatic mitral (valve) insufficiency: Secondary | ICD-10-CM | POA: Diagnosis not present

## 2017-09-03 DIAGNOSIS — R112 Nausea with vomiting, unspecified: Secondary | ICD-10-CM | POA: Diagnosis present

## 2017-09-03 DIAGNOSIS — R1011 Right upper quadrant pain: Principal | ICD-10-CM | POA: Insufficient documentation

## 2017-09-03 DIAGNOSIS — N179 Acute kidney failure, unspecified: Secondary | ICD-10-CM | POA: Diagnosis not present

## 2017-09-03 DIAGNOSIS — Z7982 Long term (current) use of aspirin: Secondary | ICD-10-CM | POA: Diagnosis not present

## 2017-09-03 DIAGNOSIS — I252 Old myocardial infarction: Secondary | ICD-10-CM | POA: Diagnosis not present

## 2017-09-03 DIAGNOSIS — D631 Anemia in chronic kidney disease: Secondary | ICD-10-CM | POA: Insufficient documentation

## 2017-09-03 DIAGNOSIS — J449 Chronic obstructive pulmonary disease, unspecified: Secondary | ICD-10-CM | POA: Diagnosis not present

## 2017-09-03 DIAGNOSIS — R109 Unspecified abdominal pain: Secondary | ICD-10-CM | POA: Diagnosis present

## 2017-09-03 DIAGNOSIS — E785 Hyperlipidemia, unspecified: Secondary | ICD-10-CM | POA: Insufficient documentation

## 2017-09-03 DIAGNOSIS — E119 Type 2 diabetes mellitus without complications: Secondary | ICD-10-CM

## 2017-09-03 DIAGNOSIS — F32A Depression, unspecified: Secondary | ICD-10-CM | POA: Diagnosis present

## 2017-09-03 DIAGNOSIS — I700 Atherosclerosis of aorta: Secondary | ICD-10-CM | POA: Diagnosis not present

## 2017-09-03 DIAGNOSIS — Z992 Dependence on renal dialysis: Secondary | ICD-10-CM | POA: Insufficient documentation

## 2017-09-03 DIAGNOSIS — Z87891 Personal history of nicotine dependence: Secondary | ICD-10-CM | POA: Insufficient documentation

## 2017-09-03 DIAGNOSIS — Z79899 Other long term (current) drug therapy: Secondary | ICD-10-CM | POA: Diagnosis not present

## 2017-09-03 DIAGNOSIS — Z85528 Personal history of other malignant neoplasm of kidney: Secondary | ICD-10-CM | POA: Insufficient documentation

## 2017-09-03 DIAGNOSIS — K3184 Gastroparesis: Secondary | ICD-10-CM

## 2017-09-03 DIAGNOSIS — N186 End stage renal disease: Secondary | ICD-10-CM

## 2017-09-03 DIAGNOSIS — K449 Diaphragmatic hernia without obstruction or gangrene: Secondary | ICD-10-CM | POA: Diagnosis not present

## 2017-09-03 DIAGNOSIS — N2581 Secondary hyperparathyroidism of renal origin: Secondary | ICD-10-CM | POA: Diagnosis not present

## 2017-09-03 DIAGNOSIS — E1129 Type 2 diabetes mellitus with other diabetic kidney complication: Secondary | ICD-10-CM

## 2017-09-03 DIAGNOSIS — E11649 Type 2 diabetes mellitus with hypoglycemia without coma: Secondary | ICD-10-CM

## 2017-09-03 DIAGNOSIS — E1143 Type 2 diabetes mellitus with diabetic autonomic (poly)neuropathy: Secondary | ICD-10-CM | POA: Diagnosis not present

## 2017-09-03 DIAGNOSIS — F329 Major depressive disorder, single episode, unspecified: Secondary | ICD-10-CM | POA: Diagnosis present

## 2017-09-03 DIAGNOSIS — I10 Essential (primary) hypertension: Secondary | ICD-10-CM | POA: Diagnosis present

## 2017-09-03 LAB — COMPREHENSIVE METABOLIC PANEL
ALT: 19 U/L (ref 14–54)
AST: 44 U/L — ABNORMAL HIGH (ref 15–41)
Albumin: 3.8 g/dL (ref 3.5–5.0)
Alkaline Phosphatase: 127 U/L — ABNORMAL HIGH (ref 38–126)
Anion gap: 14 (ref 5–15)
BUN: 16 mg/dL (ref 6–20)
CO2: 25 mmol/L (ref 22–32)
Calcium: 8.1 mg/dL — ABNORMAL LOW (ref 8.9–10.3)
Chloride: 97 mmol/L — ABNORMAL LOW (ref 101–111)
Creatinine, Ser: 4.97 mg/dL — ABNORMAL HIGH (ref 0.44–1.00)
GFR calc Af Amer: 10 mL/min — ABNORMAL LOW (ref >60)
GFR calc non Af Amer: 9 mL/min — ABNORMAL LOW (ref >60)
Glucose, Bld: 212 mg/dL — ABNORMAL HIGH (ref 65–99)
Potassium: 5.2 mmol/L — ABNORMAL HIGH (ref 3.5–5.1)
Sodium: 136 mmol/L (ref 135–145)
Total Bilirubin: 1.2 mg/dL (ref 0.3–1.2)
Total Protein: 7.8 g/dL (ref 6.5–8.1)

## 2017-09-03 LAB — CBC
HCT: 39.1 % (ref 35.0–47.0)
Hemoglobin: 12.7 g/dL (ref 12.0–16.0)
MCH: 30.6 pg (ref 26.0–34.0)
MCHC: 32.4 g/dL (ref 32.0–36.0)
MCV: 94.3 fL (ref 80.0–100.0)
Platelets: 152 10*3/uL (ref 150–440)
RBC: 4.15 MIL/uL (ref 3.80–5.20)
RDW: 16.5 % — ABNORMAL HIGH (ref 11.5–14.5)
WBC: 5.3 10*3/uL (ref 3.6–11.0)

## 2017-09-03 LAB — TROPONIN I: Troponin I: <0.03 ng/mL (ref <0.03)

## 2017-09-03 LAB — LIPASE, BLOOD: Lipase: 19 U/L (ref 11–51)

## 2017-09-03 MED ORDER — ONDANSETRON HCL 4 MG PO TABS: 4.0000 mg | ORAL_TABLET | Freq: Four times a day (QID) | ORAL | Status: DC | PRN

## 2017-09-03 MED ORDER — FLUOXETINE HCL 20 MG PO CAPS
20.0000 mg | ORAL_CAPSULE | Freq: Every day | ORAL | Status: DC
  Administered 2017-09-04: 20 mg via ORAL
  Filled 2017-09-03 (×3): qty 1

## 2017-09-03 MED ORDER — PROMETHAZINE HCL 25 MG/ML IJ SOLN
12.5000 mg | Freq: Once | INTRAMUSCULAR | Status: AC
  Administered 2017-09-03: 12.5 mg via INTRAVENOUS

## 2017-09-03 MED ORDER — HYDROMORPHONE HCL 1 MG/ML IJ SOLN
0.5000 mg | Freq: Four times a day (QID) | INTRAMUSCULAR | Status: DC | PRN
  Administered 2017-09-04: 0.5 mg via INTRAVENOUS
  Filled 2017-09-03: qty 0.5

## 2017-09-03 MED ORDER — HYDROMORPHONE HCL 1 MG/ML IJ SOLN
0.5000 mg | Freq: Once | INTRAMUSCULAR | Status: AC
  Administered 2017-09-03: 0.5 mg via INTRAVENOUS

## 2017-09-03 MED ORDER — INSULIN ASPART 100 UNIT/ML ~~LOC~~ SOLN
0.0000 [IU] | Freq: Four times a day (QID) | SUBCUTANEOUS | Status: DC
  Administered 2017-09-04: 2 [IU] via SUBCUTANEOUS
  Filled 2017-09-03: qty 1

## 2017-09-03 MED ORDER — ONDANSETRON HCL 4 MG/2ML IJ SOLN
INTRAMUSCULAR | Status: AC
  Filled 2017-09-03: qty 2

## 2017-09-03 MED ORDER — HYDROXYZINE HCL 25 MG PO TABS
25.0000 mg | ORAL_TABLET | Freq: Every day | ORAL | Status: DC
Start: 2017-09-04 — End: 2017-09-05
  Administered 2017-09-04 – 2017-09-05 (×2): 25 mg via ORAL
  Filled 2017-09-03 (×2): qty 1

## 2017-09-03 MED ORDER — FENTANYL CITRATE (PF) 100 MCG/2ML IJ SOLN
INTRAMUSCULAR | Status: AC
  Administered 2017-09-03: 100 ug via INTRAVENOUS
  Filled 2017-09-03: qty 2

## 2017-09-03 MED ORDER — OLANZAPINE 5 MG PO TABS
5.0000 mg | ORAL_TABLET | Freq: Every day | ORAL | Status: DC
  Administered 2017-09-04: 5 mg via ORAL
  Filled 2017-09-03 (×3): qty 1

## 2017-09-03 MED ORDER — HYDROMORPHONE HCL 1 MG/ML IJ SOLN
INTRAMUSCULAR | Status: AC
  Administered 2017-09-03: 0.5 mg via INTRAVENOUS
  Filled 2017-09-03: qty 1

## 2017-09-03 MED ORDER — ASPIRIN EC 81 MG PO TBEC
81.0000 mg | DELAYED_RELEASE_TABLET | Freq: Every day | ORAL | Status: DC
  Administered 2017-09-04 – 2017-09-05 (×2): 81 mg via ORAL
  Filled 2017-09-03 (×2): qty 1

## 2017-09-03 MED ORDER — HYDROMORPHONE HCL 1 MG/ML PO LIQD: 0.5000 mg | ORAL | Status: DC

## 2017-09-03 MED ORDER — ACETAMINOPHEN 325 MG PO TABS: 650.0000 mg | ORAL_TABLET | Freq: Four times a day (QID) | ORAL | Status: DC | PRN

## 2017-09-03 MED ORDER — RIFAXIMIN 550 MG PO TABS
550.0000 mg | ORAL_TABLET | Freq: Two times a day (BID) | ORAL | Status: DC
  Administered 2017-09-04 – 2017-09-05 (×3): 550 mg via ORAL
  Filled 2017-09-03 (×5): qty 1

## 2017-09-03 MED ORDER — PREGABALIN 25 MG PO CAPS
25.0000 mg | ORAL_CAPSULE | Freq: Every day | ORAL | Status: DC
  Administered 2017-09-04 – 2017-09-05 (×2): 25 mg via ORAL
  Filled 2017-09-03 (×2): qty 1

## 2017-09-03 MED ORDER — AMLODIPINE BESYLATE 10 MG PO TABS
10.0000 mg | ORAL_TABLET | Freq: Every day | ORAL | Status: DC
  Administered 2017-09-04 – 2017-09-05 (×2): 10 mg via ORAL
  Filled 2017-09-03 (×2): qty 1

## 2017-09-03 MED ORDER — ACETAMINOPHEN 650 MG RE SUPP: 650.0000 mg | Freq: Four times a day (QID) | RECTAL | Status: DC | PRN

## 2017-09-03 MED ORDER — METOCLOPRAMIDE HCL 5 MG/ML IJ SOLN
10.0000 mg | Freq: Three times a day (TID) | INTRAMUSCULAR | Status: DC
  Administered 2017-09-04: 10 mg via INTRAVENOUS
  Filled 2017-09-03 (×2): qty 2

## 2017-09-03 MED ORDER — PROMETHAZINE HCL 25 MG/ML IJ SOLN
INTRAMUSCULAR | Status: AC
  Administered 2017-09-03: 25 mg via INTRAVENOUS
  Filled 2017-09-03: qty 1

## 2017-09-03 MED ORDER — PANCRELIPASE (LIP-PROT-AMYL) 12000-38000 UNITS PO CPEP
24000.0000 [IU] | ORAL_CAPSULE | Freq: Three times a day (TID) | ORAL | Status: DC
  Administered 2017-09-04 – 2017-09-05 (×3): 24000 [IU] via ORAL
  Administered 2017-09-05: 36000 [IU] via ORAL
  Filled 2017-09-03 (×6): qty 3

## 2017-09-03 MED ORDER — PROMETHAZINE HCL 25 MG/ML IJ SOLN
INTRAMUSCULAR | Status: AC
  Administered 2017-09-03: 12.5 mg via INTRAVENOUS
  Filled 2017-09-03: qty 1

## 2017-09-03 MED ORDER — HYDRALAZINE HCL 25 MG PO TABS
25.0000 mg | ORAL_TABLET | Freq: Three times a day (TID) | ORAL | Status: DC
  Administered 2017-09-04 – 2017-09-05 (×4): 25 mg via ORAL
  Filled 2017-09-03 (×4): qty 1

## 2017-09-03 MED ORDER — PROMETHAZINE HCL 25 MG/ML IJ SOLN
12.5000 mg | Freq: Once | INTRAMUSCULAR | Status: AC
  Administered 2017-09-03: 25 mg via INTRAVENOUS

## 2017-09-03 MED ORDER — FENTANYL CITRATE (PF) 100 MCG/2ML IJ SOLN
100.0000 ug | Freq: Once | INTRAMUSCULAR | Status: AC
  Administered 2017-09-03: 100 ug via INTRAVENOUS
  Filled 2017-09-03: qty 2

## 2017-09-03 MED ORDER — CLOPIDOGREL BISULFATE 75 MG PO TABS
75.0000 mg | ORAL_TABLET | Freq: Every day | ORAL | Status: DC
Start: 2017-09-04 — End: 2017-09-05
  Administered 2017-09-04 – 2017-09-05 (×2): 75 mg via ORAL
  Filled 2017-09-03 (×2): qty 1

## 2017-09-03 MED ORDER — PANTOPRAZOLE SODIUM 40 MG PO TBEC
40.0000 mg | DELAYED_RELEASE_TABLET | Freq: Two times a day (BID) | ORAL | Status: DC
  Administered 2017-09-04 – 2017-09-05 (×3): 40 mg via ORAL
  Filled 2017-09-03 (×3): qty 1

## 2017-09-03 MED ORDER — HEPARIN SODIUM (PORCINE) 5000 UNIT/ML IJ SOLN
5000.0000 [IU] | Freq: Three times a day (TID) | INTRAMUSCULAR | Status: DC
  Administered 2017-09-05: 5000 [IU] via SUBCUTANEOUS
  Filled 2017-09-03 (×2): qty 1

## 2017-09-03 MED ORDER — ONDANSETRON HCL 4 MG/2ML IJ SOLN
4.0000 mg | Freq: Four times a day (QID) | INTRAMUSCULAR | Status: DC | PRN
  Administered 2017-09-04: 4 mg via INTRAVENOUS
  Filled 2017-09-03: qty 2

## 2017-09-03 MED ORDER — FENTANYL CITRATE (PF) 100 MCG/2ML IJ SOLN
100.0000 ug | Freq: Once | INTRAMUSCULAR | Status: AC
  Administered 2017-09-03: 100 ug via INTRAVENOUS

## 2017-09-03 MED ORDER — HYDROCODONE-ACETAMINOPHEN 5-325 MG PO TABS: 1.0000 | ORAL_TABLET | Freq: Four times a day (QID) | ORAL | Status: DC | PRN

## 2017-09-03 MED ORDER — SEVELAMER CARBONATE 800 MG PO TABS
800.0000 mg | ORAL_TABLET | Freq: Three times a day (TID) | ORAL | Status: DC
  Administered 2017-09-04 – 2017-09-05 (×4): 800 mg via ORAL
  Filled 2017-09-03 (×4): qty 1

## 2017-09-03 NOTE — ED Notes (Signed)
Pt sleeping. NAD noted at this time.

## 2017-09-03 NOTE — ED Notes (Signed)
Pt sleeping at this time. PTs head of the bed has been lifted slightly in hopes that pts head could be raised without increased nausea.

## 2017-09-03 NOTE — ED Notes (Signed)
Pt calling out again and reporting she needs help and is in pain. MD aware.

## 2017-09-03 NOTE — H&P (Signed)
[facility] NAME: [name]    MR#:  [id number]  DATE OF BIRTH:  Dec 02, 1960  DATE OF ADMISSION:  09/03/2017  PRIMARY CARE PHYSICIAN: Ellamae Sia, MD   REQUESTING/REFERRING PHYSICIAN: Clearnce Hasten, MD  CHIEF COMPLAINT:   Chief Complaint  Patient presents with  . Abdominal Pain    HISTORY OF PRESENT ILLNESS:  [name]  is a 56 y.o. female who presents with domino pain, nausea and vomiting.  Patient received multiple doses of narcotic analgesia and antiemetics here in the ED for her symptoms.  She is somewhat sleepy on this writer's examination and interview.  However, she did wake up enough to state that her symptoms began earlier today.  She states that this does feel like when she has had gastroparesis in the past.  Hospitalist were called for admission due to difficult to control abdominal pain with nausea and vomiting.  Imaging in the ED was reassuring, labs are largely within normal limits.  PAST MEDICAL HISTORY:   Past Medical History:  Diagnosis Date  . Anemia   . Anginal pain ([place])   . Anxiety   . Arthritis   . Asthma   . Broken wrist   . Bronchitis   . chronic diastolic CHF 09/27/2692  . COPD (chronic obstructive pulmonary disease) ([place])   . Coronary artery disease    a. cath 2013: stenting to RCA (report not available); b. cath 2014: LM nl, pLAD 40%, mLAD nl, ost LCx 40%, mid LCx nl, pRCA 30% @ site of prior stent, mRCA 50%  . Depression   . Diabetes mellitus without complication ([place])   . Diabetic neuropathy ([place])   . dialysis 2006  . Diverticulosis   . Dizziness   . Dyspnea   . Elevated lipids   . Environmental and seasonal allergies   . ESRD (end stage renal disease) on dialysis ([place])    M-W-F  . GERD (gastroesophageal reflux disease)   . Headache   . History of hiatal hernia   . HOH (hard of hearing)   . Hx of pancreatitis 2015  . Hypertension   . Lower extremity edema   . Mitral  regurgitation    a. echo 10/2013: EF 62%, noWMA, mildly dilated LA, mild to mod MR/TR, GR1DD  . Myocardial infarction ([place])   . Orthopnea   . Pneumonia   . Renal cancer ([place])   . Renal insufficiency    Pt is on dialysis on M,W + F.  . Wheezing     PAST SURGICAL HISTORY:   Past Surgical History:  Procedure Laterality Date  . ABDOMINAL HYSTERECTOMY  1992  . APPENDECTOMY    . CARDIAC CATHETERIZATION Left 07/26/2015   Procedure: Left Heart Cath and Coronary Angiography;  Surgeon: Dionisio , MD;  Location: [facility];  Service: Cardiovascular;  Laterality: Left;  . CATARACT EXTRACTION W/ INTRAOCULAR LENS IMPLANT Right   . CATARACT EXTRACTION W/PHACO Left 03/10/2017   Procedure: CATARACT EXTRACTION PHACO AND INTRAOCULAR LENS PLACEMENT (IOC);  Surgeon: Birder Robson, MD;  Location: [facility] ORS;  Service: Ophthalmology;  Laterality: Left;  [place] 00:51.9 AP% 14.2 CDE 7.39 fluid pack lot # [id number]  . CHOLECYSTECTOMY    . COLONOSCOPY WITH PROPOFOL N/A 08/12/2016   Procedure: COLONOSCOPY WITH PROPOFOL;  Surgeon: Lollie Sails, MD;  Location: [facility] ENDOSCOPY;  Service: Endoscopy;  Laterality: N/A;  . DIALYSIS FISTULA CREATION Left    upper arm  . dialysis grafts    .  ESOPHAGOGASTRODUODENOSCOPY N/A 03/08/2015   Procedure: ESOPHAGOGASTRODUODENOSCOPY (EGD);  Surgeon: Manya Silvas, MD;  Location: [facility] ENDOSCOPY;  Service: Endoscopy;  Laterality: N/A;  . ESOPHAGOGASTRODUODENOSCOPY (EGD) WITH PROPOFOL N/A 03/18/2016   Procedure: ESOPHAGOGASTRODUODENOSCOPY (EGD) WITH PROPOFOL;  Surgeon: [name], MD;  Location: [facility] ENDOSCOPY;  Service: Endoscopy;  Laterality: N/A;  . EYE SURGERY Right 2018  . FECAL TRANSPLANT N/A 08/23/2015   Procedure: FECAL TRANSPLANT;  Surgeon: Manya Silvas, MD;  Location: [facility] ENDOSCOPY;  Service: Endoscopy;  Laterality: N/A;  . HAND SURGERY Bilateral   . PERIPHERAL VASCULAR CATHETERIZATION N/A 12/20/2015   Procedure: Thrombectomy of dialysis access  versus permcath placement;  Surgeon: Algernon Huxley, MD;  Location: [facility];  Service: Cardiovascular;  Laterality: N/A;  . PERIPHERAL VASCULAR CATHETERIZATION N/A 12/20/2015   Procedure: A/V Shunt Intervention;  Surgeon: Algernon Huxley, MD;  Location: [place] [facility];  Service: Cardiovascular;  Laterality: N/A;  . PERIPHERAL VASCULAR CATHETERIZATION N/A 12/20/2015   Procedure: A/V Shuntogram/Fistulagram;  Surgeon: Algernon Huxley, MD;  Location: [place] [facility];  Service: Cardiovascular;  Laterality: N/A;  . PERIPHERAL VASCULAR CATHETERIZATION N/A 01/02/2016   Procedure: A/V Shuntogram/Fistulagram;  Surgeon: Algernon Huxley, MD;  Location: [place] [facility];  Service: Cardiovascular;  Laterality: N/A;  . PERIPHERAL VASCULAR CATHETERIZATION N/A 01/02/2016   Procedure: A/V Shunt Intervention;  Surgeon: Algernon Huxley, MD;  Location: [place] [facility];  Service: Cardiovascular;  Laterality: N/A;    SOCIAL HISTORY:   Social History   Tobacco Use  . Smoking status: Former Smoker    Packs/day: 0.50    Years: 40.00    Pack years: 20.00    Types: Cigarettes    Last attempt to quit: 02/13/2015    Years since quitting: 2.5  . Smokeless tobacco: Never Used  Substance Use Topics  . Alcohol use: Yes    Alcohol/week: 0.6 oz    Types: 1 Glasses of wine per week    Comment: glass wine week per pt    FAMILY HISTORY:   Family History  Problem Relation Age of Onset  . Kidney disease Mother   . Diabetes Mother   . Cancer Father   . Kidney disease Sister     DRUG ALLERGIES:   Allergies  Allergen Reactions  . Ace Inhibitors Swelling and Anaphylaxis  . Ativan [Lorazepam] Other (See Comments)    Reaction:Hallucinations and headaches Reaction:Hallucinations and headaches Reaction:  Hallucinations and headaches  . Compazine [Prochlorperazine Edisylate] Anaphylaxis and Nausea And Vomiting    Other reaction(s): dystonia from this vs. reglan 23 Jul - patient relates that she takes  promethazine frequently with no problems. 23 Jul - patient relates that she takes promethazine frequently with no problems.  . Sumatriptan Succinate     Other reaction(s): delirium and hallucinations per [facility] records  . Dilaudid [Hydromorphone Hcl] Other (See Comments)    Delirium   . Ondansetron Other (See Comments)    hallucinations    . Zofran [Ondansetron Hcl] Other (See Comments)    Reaction:  hallucinations  Reaction:  hallucinations   . Codeine Nausea And Vomiting  . Gabapentin Other (See Comments)    Reaction:  Unknown    . [place] Nausea And Vomiting  . Losartan Nausea Only  . Oxycodone Anxiety  . Prochlorperazine Other (See Comments)    Reaction:  Unknown . Patient does not remember reaction but she does have vertigo and anxiety along with n and v at times. Could be used  to treat any of these   . Reglan [Metoclopramide]     Per patient her Dr. Evelina Bucy her off it  Per patient her Dr. Evelina Bucy her off it   . Scopolamine Other (See Comments)    Dizziness, also has vertigo already  . Tape Rash  . Tapentadol Rash    MEDICATIONS AT HOME:   Prior to Admission medications   Medication Sig Start Date End Date Taking? Authorizing Provider  acidophilus (RISAQUAD) CAPS capsule Take 1 capsule by mouth daily with lunch.     [provider]  albuterol (PROVENTIL HFA;VENTOLIN HFA) 108 (90 Base) MCG/ACT inhaler Inhale 2 puffs into the lungs every 6 (six) hours as needed for shortness of breath.     [provider]  albuterol (PROVENTIL) (2.5 MG/3ML) 0.083% nebulizer solution Take 2.5 mg by nebulization every 6 (six) hours as needed for wheezing or shortness of breath.    [provider]  alum & mag hydroxide-simeth (MAALOX MAX) 400-400-40 MG/5ML suspension Take 5 mLs by mouth every 6 (six) hours as needed for indigestion. 06/09/17   Rudene Re, MD  amLODipine (NORVASC) 10 MG tablet Take 10 mg by mouth daily.    [provider]  aspirin EC 81 MG  tablet Take 81 mg by mouth daily. 09/08/16   [provider]  atorvastatin (LIPITOR) 40 MG tablet Take 20 mg by mouth at bedtime.  12/02/16   [provider]  cetirizine (ZYRTEC) 10 MG tablet Take 10 mg by mouth daily.     [provider]  clopidogrel (PLAVIX) 75 MG tablet Take 1 tablet (75 mg total) by mouth daily. 03/25/17 03/25/18  Dustin Flock, MD  cyanocobalamin 1000 MCG tablet Take 1,000 mcg by mouth daily with lunch.     [provider]  diclofenac sodium (VOLTAREN) 1 % GEL Apply 2 g topically 2 (two) times daily as needed (for leg pain).     [provider]  diphenhydrAMINE (BENADRYL) 25 mg capsule Take 25 mg by mouth every 6 (six) hours as needed for itching.    [provider]  famotidine (PEPCID) 20 MG tablet Take 1 tablet (20 mg total) by mouth 2 (two) times daily. 06/09/17 06/09/18  Rudene Re, MD  FLUoxetine (PROZAC) 20 MG capsule Take 1 capsule (20 mg total) by mouth daily. Patient taking differently: Take 20 mg by mouth at bedtime.  04/10/16   Hower, Aaron Mose, MD  glucose 4 GM chewable tablet Chew 3-4 tablets by mouth as needed for low blood sugar (blood sugar less than 60).     [provider]  hydrALAZINE (APRESOLINE) 25 MG tablet Take 25 mg by mouth 3 (three) times daily.    [provider]  HYDROcodone-acetaminophen (NORCO/VICODIN) 5-325 MG tablet Take 1 tablet by mouth every 6 (six) hours as needed for moderate pain or severe pain. 05/05/17   Nicholes Mango, MD  hydrOXYzine (ATARAX/VISTARIL) 25 MG tablet Take 25 mg by mouth daily.    [provider]  LEVEMIR FLEXTOUCH 100 UNIT/ML Pen Inject 4 Units into the skin 2 (two) times daily.  12/03/16   [provider]  lipase/protease/amylase (CREON) 12000 UNITS CPEP capsule Take 24,000-36,000 Units by mouth 3 (three) times daily with meals. Take 36000 mg in the morning, 24000 midday & 36000 in the evening    [provider]  LORazepam  (ATIVAN) 1 MG tablet Take 1 tablet (1 mg total) by mouth 2 (two) times daily. 06/08/17 06/08/18  Earleen Newport, MD  meclizine (ANTIVERT) 25 MG tablet 25 mg    [provider]  metoCLOPramide (REGLAN) 10 MG tablet Take 1 tablet (10 mg total) by mouth every 8 (eight) hours as needed for nausea or vomiting. 06/08/17   Earleen Newport, MD  multivitamin (RENA-VIT) TABS tablet Take 1 tablet by mouth daily.     [provider]  nitroGLYCERIN (NITROSTAT) 0.4 MG SL tablet Place 0.4 mg under the tongue every 5 (five) minutes x 3 doses as needed.    [provider]  NOVOLOG FLEXPEN 100 UNIT/ML FlexPen Inject 4-12 Units into the skin 3 (three) times daily before meals. Per sliding scale 12/03/16   [provider]  OLANZapine (ZYPREXA) 5 MG tablet Take 0.5 tablets (2.5 mg total) by mouth at bedtime. Patient taking differently: Take 5 mg by mouth at bedtime.  04/29/16   Loletha Grayer, MD  omega-3 acid ethyl esters (LOVAZA) 1 g capsule Take 2 g by mouth 2 (two) times daily.     [provider]  pantoprazole (PROTONIX) 40 MG tablet Take 1 tablet (40 mg total) by mouth 2 (two) times daily before a meal. 03/19/16   Gladstone Lighter, MD  pregabalin (LYRICA) 25 MG capsule Take 25 mg by mouth daily.    [provider]  promethazine (PHENERGAN) 25 MG tablet Take 12.5 mg by mouth every 6 (six) hours as needed for nausea or vomiting.    [provider]  ranitidine (ZANTAC) 150 MG tablet Take 150 mg by mouth 2 (two) times daily. In the afternoon    [provider]  rifaximin (XIFAXAN) 550 MG TABS tablet Take 550 mg by mouth 2 (two) times daily.    [provider]  sevelamer carbonate (RENVELA) 800 MG tablet Take 800 mg by mouth 3 (three) times daily with meals.     [provider]    REVIEW OF SYSTEMS:  Review of Systems  Constitutional: Negative for chills, fever, malaise/fatigue and weight loss.  HENT: Negative for ear  pain, hearing loss and tinnitus.   Eyes: Negative for blurred vision, double vision, pain and redness.  Respiratory: Negative for cough, hemoptysis and shortness of breath.   Cardiovascular: Negative for chest pain, palpitations, orthopnea and leg swelling.  Gastrointestinal: Positive for abdominal pain, nausea and vomiting. Negative for constipation and diarrhea.  Genitourinary: Negative for dysuria, frequency and hematuria.  Musculoskeletal: Negative for back pain, joint pain and neck pain.  Skin:       No acne, rash, or lesions  Neurological: Negative for dizziness, tremors, focal weakness and weakness.  Endo/Heme/Allergies: Negative for polydipsia. Does not bruise/bleed easily.  Psychiatric/Behavioral: Negative for depression. The patient is not nervous/anxious and does not have insomnia.      VITAL SIGNS:   Vitals:   09/03/17 1515 09/03/17 1530 09/03/17 1545 09/03/17 1600  BP: (!) 157/59 (!) 151/59 (!) 150/60 (!) 152/62  Pulse: 66 66 63 67  Resp: 10 (!) 8 12 14   SpO2: (!) 87% 92% 97% 100%  Weight:      Height:       Wt Readings from Last 3 Encounters:  09/03/17 83.9 kg (185 lb)  08/14/17 84.1 kg (185 lb 8 oz)  06/18/17 80.6 kg (177 lb 12.8 oz)    PHYSICAL EXAMINATION:  Physical Exam  Vitals reviewed. Constitutional: She is oriented to person, place, and time. She appears well-developed and well-nourished. No distress.  HENT:  Head: Normocephalic and atraumatic.  Mouth/Throat: Oropharynx is clear and moist.  Eyes: Conjunctivae and  EOM are normal. Pupils are equal, round, and reactive to light. No scleral icterus.  Neck: Normal range of motion. Neck supple. No JVD present. No thyromegaly present.  Cardiovascular: Normal rate, regular rhythm and intact distal pulses. Exam reveals no gallop and no friction rub.  No murmur heard. Respiratory: Effort normal and breath sounds normal. No respiratory distress. She has no wheezes. She has no rales.  GI: Soft. Bowel sounds are  normal. She exhibits no distension. There is tenderness.  Musculoskeletal: Normal range of motion. She exhibits no edema.  No arthritis, no gout  Lymphadenopathy:    She has no cervical adenopathy.  Neurological: She is oriented to person, place, and time. No cranial nerve deficit.  No dysarthria, no aphasia  Skin: Skin is warm and dry. No rash noted. No erythema.  Psychiatric: She has a normal mood and affect. Her behavior is normal. Judgment and thought content normal.    LABORATORY PANEL:   CBC Recent Labs  Lab 09/03/17 1400  WBC 5.3  HGB 12.7  HCT 39.1  PLT 152   ------------------------------------------------------------------------------------------------------------------  Chemistries  Recent Labs  Lab 09/03/17 1500  NA 136  K 5.2*  CL 97*  CO2 25  GLUCOSE 212*  BUN 16  CREATININE 4.97*  CALCIUM 8.1*  AST 44*  ALT 19  ALKPHOS 127*  BILITOT 1.2   ------------------------------------------------------------------------------------------------------------------  Cardiac Enzymes Recent Labs  Lab 09/03/17 1500  TROPONINI <0.03   ------------------------------------------------------------------------------------------------------------------  RADIOLOGY:  Ct Abdomen Pelvis Wo Contrast  Result Date: 09/03/2017 CLINICAL DATA:  Abdominal pain with nausea and vomiting. EXAM: CT ABDOMEN AND PELVIS WITHOUT CONTRAST TECHNIQUE: Multidetector CT imaging of the abdomen and pelvis was performed following the standard protocol without IV contrast. COMPARISON:  06/09/2017 FINDINGS: Lower chest: Heart size upper normal to mildly increased. Tiny right pleural effusion noted. Hepatobiliary: No focal abnormality within the hepatic parenchyma. Probable component of diffuse intrahepatic biliary duct distension with extrahepatic common duct fully visualized on this noncontrast study. Gallbladder surgically absent. Pancreas: No focal mass lesion. No dilatation of the main duct. No  intraparenchymal cyst. No peripancreatic edema. Spleen: No splenomegaly. No focal mass lesion. Adrenals/Urinary Tract: No adrenal nodule or mass. Kidneys are atrophic. No evidence for hydroureter. The urinary bladder appears normal for the degree of distention. Stomach/Bowel: Small hiatal hernia. Stomach otherwise unremarkable. Duodenum is normally positioned as is the ligament of Treitz. No small bowel wall thickening. No small bowel dilatation. The terminal ileum is normal. The appendix is not visualized, but there is no edema or inflammation in the region of the cecum. Diverticuli are seen scattered along the entire length of the colon without CT findings of diverticulitis. Vascular/Lymphatic: There is abdominal aortic atherosclerosis without aneurysm. There is no gastrohepatic or hepatoduodenal ligament lymphadenopathy. No intraperitoneal or retroperitoneal lymphadenopathy. No pelvic sidewall lymphadenopathy. Reproductive: Uterus surgically absent.  There is no adnexal mass. Other: No intraperitoneal free fluid. Musculoskeletal: Bone windows reveal no worrisome lytic or sclerotic osseous lesions. Superior endplate Schmorl's node seen at L2. IMPRESSION: 1. No acute findings in the abdomen or pelvis on this noncontrast exam. 2. Diffuse intra and extrahepatic biliary duct prominence. 3. Small hiatal hernia. 4. Atrophic kidneys. Enhancing renal lesions seen on previous exam not evident on today's noncontrast study. 5.  Aortic Atherosclerois (ICD10-170.0) 6. Diffuse colonic diverticulosis. Electronically Signed   By: Misty Stanley M.D.   On: 09/03/2017 20:21   Dg Abdomen 1 View  Result Date: 09/03/2017 CLINICAL DATA:  Vomiting and abdominal pain EXAM: ABDOMEN - 1  VIEW COMPARISON:  None. FINDINGS: The bowel gas pattern is normal. Multiple calcifications overlie the lower abdomen and pelvis. Visualized bones are normal. IMPRESSION: Nonobstructive bowel gas pattern. Electronically Signed   By: Ulyses Jarred M.D.    On: 09/03/2017 18:09    EKG:   Orders placed or performed during the hospital encounter of 09/03/17  . ED EKG  . ED EKG  . EKG 12-Lead  . EKG 12-Lead    IMPRESSION AND PLAN:  Principal Problem:   Abdominal pain -suspect due to her gastroparesis, we will give her her doses of Reglan IV in hopes of improving her gut motility, otherwise as needed analgesia and antiemetics, try to avoid large doses of narcotics were possible Active Problems:   End-stage renal disease on hemodialysis [facility]) -nephrology consult for dialysis support   Nausea & vomiting -likely due to her gastroparesis, see above   Diabetic gastroparesis ([place]) -see above   DM (diabetes mellitus) ([place]) -sliding scale insulin with corresponding glucose checks   HTN (hypertension) -continue home meds   Coronary artery disease involving native coronary artery of native heart without angina pectoris -medications   Depression -continue home antidepressants  All the records are reviewed and case discussed with ED provider. Management plans discussed with the patient and/or family.  DVT PROPHYLAXIS: SubQ heparin  GI PROPHYLAXIS: PPI  ADMISSION STATUS: Observation  CODE STATUS: Full Code Status History    Date Active Date Inactive Code Status Order ID Comments User Context   05/04/2017 18:33 05/05/2017 23:30 Full Code [id number]  Henreitta Leber, MD Inpatient   03/23/2017 15:51 03/25/2017 17:39 Full Code [id number]  Gladstone Lighter, MD Inpatient   04/21/2016 15:59 04/21/2016 17:17 Full Code [id number]  Nicholes Mango, MD Inpatient   04/08/2016 14:15 04/11/2016 19:47 Full Code [id number]  Theodoro Grist, MD Inpatient   03/15/2016 13:22 03/20/2016 00:19 Full Code [id number]  Bettey Costa, MD Inpatient   02/16/2016 14:09 02/19/2016 22:38 Full Code [id number]  Bettey Costa, MD ED   12/17/2015 11:39 12/21/2015 22:09 Full Code [id number]  Dustin Flock, MD ED   11/12/2015 16:02 12/01/2015 19:43 Full Code [id number]  Bettey Costa, MD Inpatient   10/07/2015  09:37 10/12/2015 19:59 Full Code [id number]  Fritzi Mandes, MD ED   10/03/2015 03:55 10/05/2015 22:30 Full Code [id number]  Lance Coon, MD Inpatient   08/31/2015 16:00 09/03/2015 22:09 Full Code [id number]  Demetrios Loll, MD ED   07/26/2015 15:28 07/26/2015 21:36 Full Code [id number]  Dionisio Stacye Noori, MD Inpatient   07/22/2015 17:36 07/26/2015 15:28 Full Code [id number]  Vaughan Basta, MD Inpatient   06/05/2015 20:39 06/07/2015 20:03 Full Code [id number]  Bettey Costa, MD Inpatient   05/21/2015 21:42 05/24/2015 16:26 Full Code [id number]  Henreitta Leber, MD Inpatient   04/24/2015 08:55 04/26/2015 22:11 Full Code [id number]  Demetrios Loll, MD Inpatient   04/01/2015 15:58 04/05/2015 20:07 Full Code [id number]  Epifanio Lesches, MD ED   03/06/2015 08:05 03/09/2015 21:11 Full Code [id number]  Juluis Mire, MD Inpatient   02/19/2015 16:14 02/24/2015 17:41 Full Code [id number]  Aldean Jewett, MD Inpatient      TOTAL TIME TAKING CARE OF THIS PATIENT: 40 minutes.   Nabor Thomann [place] 09/03/2017, 9:22 PM  [employer]  [phone]  CC: Primary care physician; Ellamae Sia, MD  Note:  This document was prepared using Dragon voice recognition software and may include unintentional dictation errors.

## 2017-09-03 NOTE — ED Notes (Signed)
Pt moved to hallway. PT updated on care and wakes from sleep to verbalize understanding. Pt back to sleep shortly after in hallway.

## 2017-09-03 NOTE — ED Notes (Addendum)
Pt continues to call out. Pt informed again that she could use the call bell but that she could not yell out for help and call nurse through the halls. Pt refused Zofran and reports she is allergic and only takes phenergan. Pt reports Dilaudid is what usually helps her pain.

## 2017-09-03 NOTE — ED Notes (Signed)
Pt failed second PO challenge and is currently vomiting.

## 2017-09-03 NOTE — ED Provider Notes (Signed)
[facility] Emergency Department Provider Note  ____________________________________________   First MD Initiated Contact with Patient 09/03/17 1354     (approximate)  I have reviewed the triage vital signs and the nursing notes.   HISTORY  Chief Complaint Abdominal Pain   HPI [name] is a 56 y.o. female with a history of reported gastroparesis, chronic abdominal pain and end-stage renal disease on dialysis, last treatment yesterday, who is presenting to the emergency department with upper abdominal pain.  Says the pain is a 10 out of 10 in a cramping/sharp pain that is nonradiating.  She has had several episodes of vomiting.  Denies any diarrhea.  Says this pain is typical for her abdominal pain that she has been evaluated for multiple times in the past.  She points to her right upper quadrant as the area where she is having the most pain.  Past Medical History:  Diagnosis Date  . Anemia   . Anginal pain ([place])   . Anxiety   . Arthritis   . Asthma   . Broken wrist   . Bronchitis   . chronic diastolic CHF 8/92/1194  . COPD (chronic obstructive pulmonary disease) ([place])   . Coronary artery disease    a. cath 2013: stenting to RCA (report not available); b. cath 2014: LM nl, pLAD 40%, mLAD nl, ost LCx 40%, mid LCx nl, pRCA 30% @ site of prior stent, mRCA 50%  . Depression   . Diabetes mellitus without complication ([place])   . Diabetic neuropathy ([place])   . dialysis 2006  . Diverticulosis   . Dizziness   . Dyspnea   . Elevated lipids   . Environmental and seasonal allergies   . ESRD (end stage renal disease) on dialysis ([place])    M-W-F  . GERD (gastroesophageal reflux disease)   . Headache   . History of hiatal hernia   . HOH (hard of hearing)   . Hx of pancreatitis 2015  . Hypertension   . Lower extremity edema   . Mitral regurgitation    a. echo 10/2013: EF 62%, noWMA, mildly dilated LA, mild to mod MR/TR, GR1DD  . Myocardial infarction ([place])    . Orthopnea   . Pneumonia   . Renal cancer ([place])   . Renal insufficiency    Pt is on dialysis on M,W + F.  . Wheezing     Patient Active Problem List   Diagnosis Date Noted  . Carotid stenosis 06/18/2017  . Shortness of breath 05/04/2017  . Cellulitis of lower extremity 07/29/2016  . Chronic venous insufficiency 07/29/2016  . Lymphedema 07/29/2016  . TIA (transient ischemic attack) 04/21/2016  . Altered mental status 04/08/2016  . Hyperammonemia ([place]) 04/08/2016  . Elevated troponin 04/08/2016  . ESRD (end stage renal disease) ([place]) 04/08/2016  . Depression 04/08/2016  . Depression, major, recurrent, severe with psychosis ([place]) 04/08/2016  . Blood in stool   . Intractable cyclical vomiting with nausea   . Reflux esophagitis   . Gastritis   . Generalized abdominal pain   . Uncontrollable vomiting   . Major depressive disorder, recurrent episode, moderate ([place]) 03/15/2016  . Adjustment disorder with mixed anxiety and depressed mood 03/15/2016  . Malnutrition of moderate degree 12/01/2015  . Renal mass   . Dyspnea   . Acute renal failure ([place])   . Respiratory failure ([place])   . High temperature 11/14/2015  . Pulmonary edema   . Encounter for central line placement   .  Encounter for orogastric (OG) tube placement   . Nausea 11/12/2015  . Hyperkalemia 10/03/2015  . Diarrhea, unspecified 07/22/2015  . Pneumonia 05/21/2015  . Hypoglycemia 04/24/2015  . Unresponsiveness 04/24/2015  . Bradycardia 04/24/2015  . Hypothermia 04/24/2015  . Acute respiratory failure ([place]) 04/24/2015  . Acute diastolic CHF (congestive heart failure) ([place]) 04/05/2015  . Diabetic gastroparesis ([place]) 04/05/2015  . Hypokalemia 04/05/2015  . Generalized weakness 04/05/2015  . Acute pulmonary edema ([place]) 04/03/2015  . Nausea and vomiting 04/03/2015  . Hypoglycemia associated with diabetes ([place]) 04/03/2015  . Anemia of chronic disease 04/03/2015  . Secondary hyperparathyroidism ([place]) 04/03/2015  .  Pressure ulcer 04/02/2015  . Acute respiratory failure with hypoxia ([place]) 04/01/2015  . Adjustment disorder with anxiety 03/14/2015  . Somatic symptom disorder, mild 03/08/2015  . Coronary artery disease involving native coronary artery of native heart without angina pectoris   . Nausea & vomiting 03/06/2015  . Abdominal pain 03/06/2015  . DM (diabetes mellitus) ([place]) 03/06/2015  . HTN (hypertension) 03/06/2015  . Gastroparesis 02/24/2015  . Pleural effusion 02/19/2015  . HCAP (healthcare-associated pneumonia) 02/19/2015  . End-stage renal disease on hemodialysis ([place]) 02/19/2015    Past Surgical History:  Procedure Laterality Date  . ABDOMINAL HYSTERECTOMY  1992  . APPENDECTOMY    . CARDIAC CATHETERIZATION Left 07/26/2015   Procedure: Left Heart Cath and Coronary Angiography;  Surgeon: Dionisio , MD;  Location: [facility];  Service: Cardiovascular;  Laterality: Left;  . CATARACT EXTRACTION W/ INTRAOCULAR LENS IMPLANT Right   . CATARACT EXTRACTION W/PHACO Left 03/10/2017   Procedure: CATARACT EXTRACTION PHACO AND INTRAOCULAR LENS PLACEMENT (IOC);  Surgeon: Birder Robson, MD;  Location: [facility] ORS;  Service: Ophthalmology;  Laterality: Left;  [place] 00:51.9 AP% 14.2 CDE 7.39 fluid pack lot # [id number]  . CHOLECYSTECTOMY    . COLONOSCOPY WITH PROPOFOL N/A 08/12/2016   Procedure: COLONOSCOPY WITH PROPOFOL;  Surgeon: Lollie Sails, MD;  Location: [facility] ENDOSCOPY;  Service: Endoscopy;  Laterality: N/A;  . DIALYSIS FISTULA CREATION Left    upper arm  . dialysis grafts    . ESOPHAGOGASTRODUODENOSCOPY N/A 03/08/2015   Procedure: ESOPHAGOGASTRODUODENOSCOPY (EGD);  Surgeon: Manya Silvas, MD;  Location: [facility] ENDOSCOPY;  Service: Endoscopy;  Laterality: N/A;  . ESOPHAGOGASTRODUODENOSCOPY (EGD) WITH PROPOFOL N/A 03/18/2016   Procedure: ESOPHAGOGASTRODUODENOSCOPY (EGD) WITH PROPOFOL;  Surgeon: [name], MD;  Location: [facility] ENDOSCOPY;  Service: Endoscopy;  Laterality: N/A;  .  EYE SURGERY Right 2018  . FECAL TRANSPLANT N/A 08/23/2015   Procedure: FECAL TRANSPLANT;  Surgeon: Manya Silvas, MD;  Location: [facility] ENDOSCOPY;  Service: Endoscopy;  Laterality: N/A;  . HAND SURGERY Bilateral   . PERIPHERAL VASCULAR CATHETERIZATION N/A 12/20/2015   Procedure: Thrombectomy of dialysis access versus permcath placement;  Surgeon: Algernon Huxley, MD;  Location: [place] LAB;  Service: Cardiovascular;  Laterality: N/A;  . PERIPHERAL VASCULAR CATHETERIZATION N/A 12/20/2015   Procedure: A/V Shunt Intervention;  Surgeon: Algernon Huxley, MD;  Location: [place] CV LAB;  Service: Cardiovascular;  Laterality: N/A;  . PERIPHERAL VASCULAR CATHETERIZATION N/A 12/20/2015   Procedure: A/V Shuntogram/Fistulagram;  Surgeon: Algernon Huxley, MD;  Location: [place] [facility] LAB;  Service: Cardiovascular;  Laterality: N/A;  . PERIPHERAL VASCULAR CATHETERIZATION N/A 01/02/2016   Procedure: A/V Shuntogram/Fistulagram;  Surgeon: Algernon Huxley, MD;  Location: [place] CV LAB;  Service: Cardiovascular;  Laterality: N/A;  . PERIPHERAL VASCULAR CATHETERIZATION N/A 01/02/2016   Procedure: A/V Shunt Intervention;  Surgeon: Algernon Huxley, MD;  Location: [place] [facility] LAB;  Service: Cardiovascular;  Laterality: N/A;    Prior to Admission medications   Medication Sig Start Date End Date Taking? Authorizing Provider  acidophilus (RISAQUAD) CAPS capsule Take 1 capsule by mouth daily with lunch.     [provider]  albuterol (PROVENTIL HFA;VENTOLIN HFA) 108 (90 Base) MCG/ACT inhaler Inhale 2 puffs into the lungs every 6 (six) hours as needed for shortness of breath.     [provider]  albuterol (PROVENTIL) (2.5 MG/3ML) 0.083% nebulizer solution Take 2.5 mg by nebulization every 6 (six) hours as needed for wheezing or shortness of breath.    [provider]  alum & mag hydroxide-simeth (MAALOX MAX) 400-400-40 MG/5ML suspension Take 5 mLs by mouth every 6 (six) hours as needed for  indigestion. 06/09/17   Rudene Re, MD  amLODipine (NORVASC) 10 MG tablet Take 10 mg by mouth daily.    [provider]  aspirin EC 81 MG tablet Take 81 mg by mouth daily. 09/08/16   [provider]  atorvastatin (LIPITOR) 40 MG tablet Take 20 mg by mouth at bedtime.  12/02/16   [provider]  cetirizine (ZYRTEC) 10 MG tablet Take 10 mg by mouth daily.     [provider]  clopidogrel (PLAVIX) 75 MG tablet Take 1 tablet (75 mg total) by mouth daily. 03/25/17 03/25/18  Dustin Flock, MD  cyanocobalamin 1000 MCG tablet Take 1,000 mcg by mouth daily with lunch.     [provider]  diclofenac sodium (VOLTAREN) 1 % GEL Apply 2 g topically 2 (two) times daily as needed (for leg pain).     [provider]  diphenhydrAMINE (BENADRYL) 25 mg capsule Take 25 mg by mouth every 6 (six) hours as needed for itching.    [provider]  famotidine (PEPCID) 20 MG tablet Take 1 tablet (20 mg total) by mouth 2 (two) times daily. 06/09/17 06/09/18  Rudene Re, MD  FLUoxetine (PROZAC) 20 MG capsule Take 1 capsule (20 mg total) by mouth daily. Patient taking differently: Take 20 mg by mouth at bedtime.  04/10/16   Hower, Aaron Mose, MD  glucose 4 GM chewable tablet Chew 3-4 tablets by mouth as needed for low blood sugar (blood sugar less than 60).     [provider]  hydrALAZINE (APRESOLINE) 25 MG tablet Take 25 mg by mouth 3 (three) times daily.    [provider]  HYDROcodone-acetaminophen (NORCO/VICODIN) 5-325 MG tablet Take 1 tablet by mouth every 6 (six) hours as needed for moderate pain or severe pain. 05/05/17   Nicholes Mango, MD  hydrOXYzine (ATARAX/VISTARIL) 25 MG tablet Take 25 mg by mouth daily.    [provider]  LEVEMIR FLEXTOUCH 100 UNIT/ML Pen Inject 4 Units into the skin 2 (two) times daily.  12/03/16   [provider]  lipase/protease/amylase (CREON) 12000 UNITS CPEP capsule Take 24,000-36,000 Units  by mouth 3 (three) times daily with meals. Take 36000 mg in the morning, 24000 midday & 36000 in the evening    [provider]  LORazepam (ATIVAN) 1 MG tablet Take 1 tablet (1 mg total) by mouth 2 (two) times daily. 06/08/17 06/08/18  Earleen Newport, MD  meclizine (ANTIVERT) 25 MG tablet 25 mg    [provider]  metoCLOPramide (REGLAN) 10 MG tablet Take 1 tablet (10 mg total) by mouth every 8 (eight) hours as needed for nausea or vomiting. 06/08/17   Earleen Newport, MD  multivitamin (RENA-VIT) TABS  tablet Take 1 tablet by mouth daily.     [provider]  nitroGLYCERIN (NITROSTAT) 0.4 MG SL tablet Place 0.4 mg under the tongue every 5 (five) minutes x 3 doses as needed.    [provider]  NOVOLOG FLEXPEN 100 UNIT/ML FlexPen Inject 4-12 Units into the skin 3 (three) times daily before meals. Per sliding scale 12/03/16   [provider]  OLANZapine (ZYPREXA) 5 MG tablet Take 0.5 tablets (2.5 mg total) by mouth at bedtime. Patient taking differently: Take 5 mg by mouth at bedtime.  04/29/16   Loletha Grayer, MD  omega-3 acid ethyl esters (LOVAZA) 1 g capsule Take 2 g by mouth 2 (two) times daily.     [provider]  pantoprazole (PROTONIX) 40 MG tablet Take 1 tablet (40 mg total) by mouth 2 (two) times daily before a meal. 03/19/16   Gladstone Lighter, MD  pregabalin (LYRICA) 25 MG capsule Take 25 mg by mouth daily.    [provider]  promethazine (PHENERGAN) 25 MG tablet Take 12.5 mg by mouth every 6 (six) hours as needed for nausea or vomiting.    [provider]  ranitidine (ZANTAC) 150 MG tablet Take 150 mg by mouth 2 (two) times daily. In the afternoon    [provider]  rifaximin (XIFAXAN) 550 MG TABS tablet Take 550 mg by mouth 2 (two) times daily.    [provider]  sevelamer carbonate (RENVELA) 800 MG tablet Take 800 mg by mouth 3 (three) times daily with meals.     [provider]      Allergies Ace inhibitors; Ativan [lorazepam]; Compazine [prochlorperazine edisylate]; Sumatriptan succinate; Dilaudid [hydromorphone hcl]; Ondansetron; Zofran [ondansetron hcl]; Codeine; Gabapentin; [place]; Losartan; Oxycodone; Prochlorperazine; Reglan [metoclopramide]; Scopolamine; Tape; and Tapentadol  Family History  Problem Relation Age of Onset  . Kidney disease Mother   . Diabetes Mother   . Cancer Father   . Kidney disease Sister     Social History Social History   Tobacco Use  . Smoking status: Former Smoker    Packs/day: 0.50    Years: 40.00    Pack years: 20.00    Types: Cigarettes    Last attempt to quit: 02/13/2015    Years since quitting: 2.5  . Smokeless tobacco: Never Used  Substance Use Topics  . Alcohol use: Yes    Alcohol/week: 0.6 oz    Types: 1 Glasses of wine per week    Comment: glass wine week per pt  . Drug use: No    Review of Systems  Constitutional: No fever/chills Eyes: No visual changes. ENT: No sore throat. Cardiovascular: Denies chest pain. Respiratory: Denies shortness of breath. Gastrointestinal:   No diarrhea.  No constipation. Genitourinary: Negative for dysuria. Musculoskeletal: Negative for back pain. Skin: Negative for rash. Neurological: Negative for headaches, focal weakness or numbness.   ____________________________________________   PHYSICAL EXAM:  VITAL SIGNS: ED Triage Vitals  Enc Vitals Group     BP --      Pulse --      Resp --      Temp --      Temp src --      SpO2 --      Weight 09/03/17 1350 185 lb (83.9 kg)     Height 09/03/17 1350 5\' 5"  (1.651 m)     Head Circumference --      Peak Flow --      Pain Score 09/03/17 1349 10  Pain Loc --      Pain Edu? --      Excl. in [place]? --     Constitutional: Alert and oriented.  Patient appears uncomfortable, lying on her left side and holding her abdomen. Eyes: Conjunctivae are normal.  Head: Atraumatic. Nose: No  congestion/rhinnorhea. Mouth/Throat: Mucous membranes are moist.  Neck: No stridor.   Cardiovascular: Normal rate, regular rhythm. Grossly normal heart sounds.  Palpable thrill the left upper extremity dialysis fistula Respiratory: Normal respiratory effort.  No retractions. Lungs CTAB. Gastrointestinal: Soft with moderate to severe tenderness to the right upper quadrant.  No distention.  Musculoskeletal: No lower extremity tenderness nor edema.  No joint effusions. Neurologic:  Normal speech and language. No gross focal neurologic deficits are appreciated. Skin:  Skin is warm, dry and intact. No rash noted. Psychiatric: Mood and affect are normal. Speech and behavior are normal.  ____________________________________________   LABS (all labs ordered are listed, but only abnormal results are displayed)  Labs Reviewed  CBC - Abnormal; Notable for the following components:      Result Value   RDW 16.5 (*)    All other components within normal limits  COMPREHENSIVE METABOLIC PANEL - Abnormal; Notable for the following components:   Potassium 5.2 (*)    Chloride 97 (*)    Glucose, Bld 212 (*)    Creatinine, Ser 4.97 (*)    Calcium 8.1 (*)    AST 44 (*)    Alkaline Phosphatase 127 (*)    GFR calc non Af Amer 9 (*)    GFR calc Af Amer 10 (*)    All other components within normal limits  TROPONIN I  LIPASE, BLOOD  URINALYSIS, COMPLETE (UACMP) WITH MICROSCOPIC   ____________________________________________  EKG  ED ECG REPORT I, Doran Stabler, the attending physician, personally viewed and interpreted this ECG.   Date: 09/03/2017  EKG Time: 1357  Rate: 63  Rhythm: normal sinus rhythm  Axis: Normal  Intervals:none  ST&T Change: no st segment elevation or depression.  No abnormal t wave inversion.    ____________________________________________  RADIOLOGY  No acute finding on the CT of the abdomen and  pelvis. ____________________________________________   PROCEDURES  Procedure(s) performed:   Procedures  Critical Care performed:   ____________________________________________   INITIAL IMPRESSION / ASSESSMENT AND PLAN / ED COURSE  Pertinent labs & imaging results that were available during my care of the patient were reviewed by me and considered in my medical decision making (see chart for details).  Differential diagnosis includes, but is not limited to, biliary disease (biliary colic, acute cholecystitis, cholangitis, choledocholithiasis, etc), intrathoracic causes for epigastric abdominal pain including ACS, gastritis, duodenitis, pancreatitis, small bowel or large bowel obstruction, abdominal aortic aneurysm, hernia, and gastritis. As part of my medical decision making, I reviewed the following data within the electronic MEDICAL RECORD NUMBER Notes from prior ED visits   ----------------------------------------- 8:48 PM on 09/03/2017 -----------------------------------------  Patient has received a total of 200 mg of fentanyl as well as a 1/2 milligram of Dilaudid and Phenergan.  However, she is still having abdominal pain as well as vomiting with p.o. challenge.  She will be admitted to the hospital.  Signed out to Dr. Jannifer [place].  The patient is aware of the plan and diagnosis and willing to comply.     ____________________________________________   FINAL CLINICAL IMPRESSION(S) / ED DIAGNOSES  Intractable abdominal pain with nausea and vomiting.    NEW MEDICATIONS STARTED DURING THIS VISIT:  This SmartLink  is deprecated. Use AVSMEDLIST instead to display the medication list for a patient.   Note:  This document was prepared using Dragon voice recognition software and may include unintentional dictation errors.     Orbie Pyo, MD 09/03/17 2049

## 2017-09-03 NOTE — ED Notes (Signed)
RN sat pt up to attempt PO challenge and pt started to vomiting again. PT was unable to get to drink. MD updated

## 2017-09-03 NOTE — ED Notes (Signed)
Pt calling out and yelling from room at staff to help her. RN informed pt that there is a call bell if she needs help but otherwise she can not yell though the halls.

## 2017-09-04 DIAGNOSIS — R1011 Right upper quadrant pain: Secondary | ICD-10-CM | POA: Diagnosis not present

## 2017-09-04 LAB — GLUCOSE, CAPILLARY
Glucose-Capillary: 120 mg/dL — ABNORMAL HIGH (ref 65–99)
Glucose-Capillary: 170 mg/dL — ABNORMAL HIGH (ref 65–99)
Glucose-Capillary: 176 mg/dL — ABNORMAL HIGH (ref 65–99)
Glucose-Capillary: 234 mg/dL — ABNORMAL HIGH (ref 65–99)
Glucose-Capillary: 244 mg/dL — ABNORMAL HIGH (ref 65–99)
Glucose-Capillary: 35 mg/dL — CL (ref 65–99)
Glucose-Capillary: 42 mg/dL — CL (ref 65–99)

## 2017-09-04 LAB — BASIC METABOLIC PANEL
Anion gap: 13 (ref 5–15)
BUN: 22 mg/dL — ABNORMAL HIGH (ref 6–20)
CO2: 27 mmol/L (ref 22–32)
Calcium: 8.2 mg/dL — ABNORMAL LOW (ref 8.9–10.3)
Chloride: 98 mmol/L — ABNORMAL LOW (ref 101–111)
Creatinine, Ser: 6.03 mg/dL — ABNORMAL HIGH (ref 0.44–1.00)
GFR calc Af Amer: 8 mL/min — ABNORMAL LOW (ref >60)
GFR calc non Af Amer: 7 mL/min — ABNORMAL LOW (ref >60)
Glucose, Bld: 252 mg/dL — ABNORMAL HIGH (ref 65–99)
Potassium: 4 mmol/L (ref 3.5–5.1)
Sodium: 138 mmol/L (ref 135–145)

## 2017-09-04 LAB — CBC
HCT: 38.6 % (ref 35.0–47.0)
Hemoglobin: 12.4 g/dL (ref 12.0–16.0)
MCH: 30.3 pg (ref 26.0–34.0)
MCHC: 32.1 g/dL (ref 32.0–36.0)
MCV: 94.4 fL (ref 80.0–100.0)
Platelets: 132 10*3/uL — ABNORMAL LOW (ref 150–440)
RBC: 4.09 MIL/uL (ref 3.80–5.20)
RDW: 15.9 % — ABNORMAL HIGH (ref 11.5–14.5)
WBC: 4.1 10*3/uL (ref 3.6–11.0)

## 2017-09-04 LAB — PHOSPHORUS: Phosphorus: 5.5 mg/dL — ABNORMAL HIGH (ref 2.5–4.6)

## 2017-09-04 MED ORDER — PENTAFLUOROPROP-TETRAFLUOROETH EX AERO
1.0000 "application " | INHALATION_SPRAY | CUTANEOUS | Status: DC | PRN
  Filled 2017-09-04: qty 30

## 2017-09-04 MED ORDER — LIDOCAINE HCL (PF) 1 % IJ SOLN
5.0000 mL | INTRAMUSCULAR | Status: DC | PRN
  Filled 2017-09-04: qty 5

## 2017-09-04 MED ORDER — INSULIN ASPART 100 UNIT/ML ~~LOC~~ SOLN
0.0000 [IU] | Freq: Three times a day (TID) | SUBCUTANEOUS | Status: DC
Start: 2017-09-04 — End: 2017-09-05
  Administered 2017-09-04: 2 [IU] via SUBCUTANEOUS
  Filled 2017-09-04: qty 1

## 2017-09-04 MED ORDER — OXYCODONE HCL 5 MG PO TABS: 5.0000 mg | ORAL_TABLET | Freq: Four times a day (QID) | ORAL | Status: DC | PRN

## 2017-09-04 MED ORDER — HYDROMORPHONE HCL 1 MG/ML IJ SOLN: 0.5000 mg | INTRAMUSCULAR | Status: DC | PRN

## 2017-09-04 MED ORDER — INSULIN DETEMIR 100 UNIT/ML ~~LOC~~ SOLN
4.0000 [IU] | Freq: Two times a day (BID) | SUBCUTANEOUS | Status: DC
  Administered 2017-09-04: 4 [IU] via SUBCUTANEOUS
  Filled 2017-09-04 (×4): qty 0.04

## 2017-09-04 MED ORDER — HEPARIN SODIUM (PORCINE) 1000 UNIT/ML DIALYSIS
1000.0000 [IU] | INTRAMUSCULAR | Status: DC | PRN
  Filled 2017-09-04: qty 1

## 2017-09-04 MED ORDER — SODIUM CHLORIDE 0.9 % IV SOLN: 100.0000 mL | INTRAVENOUS | Status: DC | PRN

## 2017-09-04 MED ORDER — MORPHINE SULFATE (PF) 4 MG/ML IV SOLN
4.0000 mg | INTRAVENOUS | Status: DC | PRN
  Administered 2017-09-04: 4 mg via INTRAVENOUS
  Filled 2017-09-04: qty 1

## 2017-09-04 MED ORDER — SODIUM CHLORIDE 0.9 % IV SOLN
100.0000 mL | INTRAVENOUS | Status: DC | PRN
Start: 2017-09-04 — End: 2017-09-04

## 2017-09-04 MED ORDER — LIDOCAINE-PRILOCAINE 2.5-2.5 % EX CREA
1.0000 "application " | TOPICAL_CREAM | CUTANEOUS | Status: DC | PRN
  Filled 2017-09-04: qty 5

## 2017-09-04 MED ORDER — ALTEPLASE 2 MG IJ SOLR: 2.0000 mg | Freq: Once | INTRAMUSCULAR | Status: DC | PRN

## 2017-09-04 MED ORDER — OXYCODONE HCL 5 MG PO TABS
5.0000 mg | ORAL_TABLET | Freq: Four times a day (QID) | ORAL | Status: DC | PRN
  Administered 2017-09-04 – 2017-09-05 (×2): 5 mg via ORAL
  Filled 2017-09-04 (×2): qty 1

## 2017-09-04 MED ORDER — INSULIN ASPART 100 UNIT/ML ~~LOC~~ SOLN: 0.0000 [IU] | Freq: Every day | SUBCUTANEOUS | Status: DC

## 2017-09-04 NOTE — Progress Notes (Signed)
Post HD assessment  

## 2017-09-04 NOTE — Progress Notes (Signed)
Pre HD assessment  

## 2017-09-04 NOTE — Care Management Obs Status (Signed)
[place] NOTIFICATION   Patient Details  Name: [name] MRN: [id number] Date of Birth: 1961/02/12   Medicare Observation Status Notification Given:  No(off the floor for HD)    Beverly Sessions, RN 09/04/2017, 2:36 PM

## 2017-09-04 NOTE — Progress Notes (Signed)
[facility]  ROUNDING NOTE   Subjective:  Patient well-known to [place] as an outpatient. She presented with nausea and vomiting. She has known history of gastroparesis. Her symptoms have improved a bit today.   Objective:  Vital signs in last 24 hours:  Temp:  [97.4 F (36.3 C)-98.1 F (36.7 C)] 97.7 F (36.5 C) (12/14 1035) Pulse Rate:  [54-75] 58 (12/14 1230) Resp:  [8-18] 14 (12/14 1230) BP: (91-165)/(47-67) 121/51 (12/14 1230) SpO2:  [87 %-100 %] 99 % (12/14 1230) Weight:  [81 kg (178 lb 9.6 oz)-83.9 kg (185 lb)] 83.2 kg (183 lb 6.8 oz) (12/14 1035)  Weight change:  Filed Weights   09/03/17 2253 09/04/17 0401 09/04/17 1035  Weight: 81 kg (178 lb 9.6 oz) 81 kg (178 lb 9.6 oz) 83.2 kg (183 lb 6.8 oz)    Intake/Output: No intake/output data recorded.   Intake/Output this shift:  No intake/output data recorded.  Physical Exam: General: No acute distress  Head: Normocephalic, atraumatic. Moist oral mucosal membranes  Eyes: Anicteric  Neck: Supple, trachea midline  Lungs:  Clear to auscultation, normal effort  Heart: S1S2 no rubs  Abdomen:  Soft, nontender, bowel sounds present  Extremities:  peripheral edema.  Neurologic: Awake, alert, following commands  Skin: No lesions  Access: LUE AVF    Basic Metabolic Panel: Recent Labs  Lab 09/03/17 1500 09/04/17 0541  NA 136 138  K 5.2* 4.0  CL 97* 98*  CO2 25 27  GLUCOSE 212* 252*  BUN 16 22*  CREATININE 4.97* 6.03*  CALCIUM 8.1* 8.2*    Liver Function Tests: Recent Labs  Lab 09/03/17 1500  AST 44*  ALT 19  ALKPHOS 127*  BILITOT 1.2  PROT 7.8  ALBUMIN 3.8   Recent Labs  Lab 09/03/17 1500  LIPASE 19   No results for input(s): AMMONIA in the last 168 hours.  CBC: Recent Labs  Lab 09/03/17 1400 09/04/17 0541  WBC 5.3 4.1  HGB 12.7 12.4  HCT 39.1 38.6  MCV 94.3 94.4  PLT 152 132*    Cardiac Enzymes: Recent Labs  Lab 09/03/17 1500  TROPONINI <0.03    BNP: Invalid  input(s): POCBNP  CBG: Recent Labs  Lab 09/04/17 0019 09/04/17 0601 09/04/17 1002  GLUCAP 244* 234* 176*    Microbiology: Results for orders placed or performed during the hospital encounter of 06/09/17  Urine culture     Status: None   Collection Time: 06/09/17 10:39 AM  Result Value Ref Range Status   Specimen Description URINE, RANDOM  Final   Special Requests NONE  Final   Culture   Final    NO GROWTH Performed at [facility], [place] [address], [place], [place] [address]    Report Status 06/10/2017 FINAL  Final    Coagulation Studies: No results for input(s): LABPROT, INR in the last 72 hours.  Urinalysis: No results for input(s): COLORURINE, LABSPEC, PHURINE, GLUCOSEU, HGBUR, BILIRUBINUR, KETONESUR, PROTEINUR, UROBILINOGEN, NITRITE, LEUKOCYTESUR in the last 72 hours.  Invalid input(s): APPERANCEUR    Imaging: Ct Abdomen Pelvis Wo Contrast  Result Date: 09/03/2017 CLINICAL DATA:  Abdominal pain with nausea and vomiting. EXAM: CT ABDOMEN AND PELVIS WITHOUT CONTRAST TECHNIQUE: Multidetector CT imaging of the abdomen and pelvis was performed following the standard protocol without IV contrast. COMPARISON:  06/09/2017 FINDINGS: Lower chest: Heart size upper normal to mildly increased. Tiny right pleural effusion noted. Hepatobiliary: No focal abnormality within the hepatic parenchyma. Probable component of diffuse intrahepatic biliary duct distension  with extrahepatic common duct fully visualized on this noncontrast study. Gallbladder surgically absent. Pancreas: No focal mass lesion. No dilatation of the main duct. No intraparenchymal cyst. No peripancreatic edema. Spleen: No splenomegaly. No focal mass lesion. Adrenals/Urinary Tract: No adrenal nodule or mass. Kidneys are atrophic. No evidence for hydroureter. The urinary bladder appears normal for the degree of distention. Stomach/Bowel: Small hiatal hernia. Stomach otherwise unremarkable. Duodenum is normally positioned  as is the ligament of Treitz. No small bowel wall thickening. No small bowel dilatation. The terminal ileum is normal. The appendix is not visualized, but there is no edema or inflammation in the region of the cecum. Diverticuli are seen scattered along the entire length of the colon without CT findings of diverticulitis. Vascular/Lymphatic: There is abdominal aortic atherosclerosis without aneurysm. There is no gastrohepatic or hepatoduodenal ligament lymphadenopathy. No intraperitoneal or retroperitoneal lymphadenopathy. No pelvic sidewall lymphadenopathy. Reproductive: Uterus surgically absent.  There is no adnexal mass. Other: No intraperitoneal free fluid. Musculoskeletal: Bone windows reveal no worrisome lytic or sclerotic osseous lesions. Superior endplate Schmorl's node seen at L2. IMPRESSION: 1. No acute findings in the abdomen or pelvis on this noncontrast exam. 2. Diffuse intra and extrahepatic biliary duct prominence. 3. Small hiatal hernia. 4. Atrophic kidneys. Enhancing renal lesions seen on previous exam not evident on today's noncontrast study. 5.  Aortic Atherosclerois (ICD10-170.0) 6. Diffuse colonic diverticulosis. Electronically Signed   By: Misty Stanley M.D.   On: 09/03/2017 20:21   Dg Abdomen 1 View  Result Date: 09/03/2017 CLINICAL DATA:  Vomiting and abdominal pain EXAM: ABDOMEN - 1 VIEW COMPARISON:  None. FINDINGS: The bowel gas pattern is normal. Multiple calcifications overlie the lower abdomen and pelvis. Visualized bones are normal. IMPRESSION: Nonobstructive bowel gas pattern. Electronically Signed   By: Ulyses Jarred M.D.   On: 09/03/2017 18:09     Medications:   . sodium chloride    . sodium chloride     . amLODipine  10 mg Oral Daily  . aspirin EC  81 mg Oral Daily  . clopidogrel  75 mg Oral Daily  . FLUoxetine  20 mg Oral QHS  . heparin  5,000 Units Subcutaneous Q8H  . hydrALAZINE  25 mg Oral TID  . hydrOXYzine  25 mg Oral Daily  . insulin aspart  0-9 Units  Subcutaneous Q6H  . lipase/protease/amylase  24,000-36,000 Units Oral TID WC  . metoCLOPramide (REGLAN) injection  10 mg Intravenous Q8H  . OLANZapine  5 mg Oral QHS  . pantoprazole  40 mg Oral BID AC  . pregabalin  25 mg Oral Daily  . rifaximin  550 mg Oral BID  . sevelamer carbonate  800 mg Oral TID WC   sodium chloride, sodium chloride, acetaminophen **OR** acetaminophen, alteplase, heparin, HYDROmorphone (DILAUDID) injection, lidocaine (PF), lidocaine-prilocaine, ondansetron **OR** ondansetron (ZOFRAN) IV, oxyCODONE, pentafluoroprop-tetrafluoroeth  Assessment/ Plan:  56 y.o. female  with diabetes, hypertension, hyperlipidemia, diabetic retinopathy, diabetic neuropathy, diabetic gastroparesis, pancreatic insufficiency, depression, GERD. Renal masses  1.  Emesis, has history of gastroparesis. 2.  Hypertension. 3.  Secondary hyperparathyroidism. 4.  Anemia of CKD.   Plan:  Patient admitted under observation for emesis.  She is receiving treatment for this with Reglan 10 mg IV every 8 hours and is also receiving as needed Zofran.  This has improved her symptoms.  She is undergoing dialysis today.  Disposition as per hospitalist.   LOS: 0 Ahmani Prehn 12/14/201812:45 PM

## 2017-09-04 NOTE — Progress Notes (Signed)
Inpatient Diabetes Program Recommendations  AACE/ADA: New Consensus Statement on Inpatient Glycemic Control (2015)  Target Ranges:  Prepandial:   less than 140 mg/dL      Peak postprandial:   less than 180 mg/dL (1-2 hours)      Critically ill patients:  140 - 180 mg/dL    Results for [name], [name] (MRN [id number]) as of 09/04/2017 09:27  Ref. Range 09/04/2017 00:19 09/04/2017 06:01  Glucose-Capillary Latest Ref Range: 65 - 99 mg/dL 244 (H) 234 (H)    Admit with: Abd Pain and N&V  History: DM, ESRD  Home DM Meds: Levemir 4 units BID       Novolog 4-12 units TID per SSI  Current Insulin Orders: Novolog Sensitive Correction Scale/ SSI (0-9 units) Q6 hours      MD- Please consider the following in-hospital insulin adjustments:  1. Start Levemir 4 units BID (home dose)  2 Change Novolog SSI to TID AC + HS (patient now on CL diet)      --Will follow patient during hospitalization--  Wyn Quaker RN, MSN, CDE Diabetes Coordinator Inpatient Glycemic Control Team Team Pager: [phone] (8a-5p)

## 2017-09-04 NOTE — Progress Notes (Signed)
HD tx start 

## 2017-09-04 NOTE — Progress Notes (Addendum)
[facility] NAME: [name]    MR#:  [id number]  DATE OF BIRTH:  1961-09-21  SUBJECTIVE: Seen at bedside, admitted for abdominal pain, diabetic gastroparesis.  Patient denies any nausea, vomiting.  Labs are normal.  We will start clear liquids.  CHIEF COMPLAINT:   Chief Complaint  Patient presents with  . Abdominal Pain    REVIEW OF SYSTEMS:   ROS CONSTITUTIONAL: No fever, fatigue or weakness.  EYES: No blurred or double vision.  EARS, NOSE, AND THROAT: No tinnitus or ear pain.  RESPIRATORY: No cough, shortness of breath, wheezing or hemoptysis.  CARDIOVASCULAR: No chest pain, orthopnea, edema.  GASTROINTESTINAL: No nausea, vomiting, diarrhea or abdominal pain.  GENITOURINARY: No dysuria, hematuria.  ENDOCRINE: No polyuria, nocturia,  HEMATOLOGY: No anemia, easy bruising or bleeding SKIN: No rash or lesion. MUSCULOSKELETAL: No joint pain or arthritis.   NEUROLOGIC: No tingling, numbness, weakness.  PSYCHIATRY: No anxiety or depression.   DRUG ALLERGIES:   Allergies  Allergen Reactions  . Ace Inhibitors Swelling and Anaphylaxis  . Ativan [Lorazepam] Other (See Comments)    Reaction:Hallucinations and headaches Reaction:Hallucinations and headaches Reaction:  Hallucinations and headaches  . Compazine [Prochlorperazine Edisylate] Anaphylaxis and Nausea And Vomiting    Other reaction(s): dystonia from this vs. reglan 23 Jul - patient relates that she takes promethazine frequently with no problems. 23 Jul - patient relates that she takes promethazine frequently with no problems.  . Sumatriptan Succinate     Other reaction(s): delirium and hallucinations per [facility] records  . Dilaudid [Hydromorphone Hcl] Other (See Comments)    Delirium   . Ondansetron Other (See Comments)    hallucinations    . Zofran [Ondansetron Hcl] Other (See Comments)    Reaction:  hallucinations  Reaction:  hallucinations   . Codeine  Nausea And Vomiting  . Gabapentin Other (See Comments)    Reaction:  Unknown    . Lac Bovis Nausea And Vomiting  . Losartan Nausea Only  . Oxycodone Anxiety  . Prochlorperazine Other (See Comments)    Reaction:  Unknown . Patient does not remember reaction but she does have vertigo and anxiety along with n and v at times. Could be used to treat any of these   . Reglan [Metoclopramide]     Per patient her Dr. Evelina Bucy her off it  Per patient her Dr. Evelina Bucy her off it   . Scopolamine Other (See Comments)    Dizziness, also has vertigo already  . Tape Rash  . Tapentadol Rash    VITALS:  Blood pressure (!) 165/58, pulse 74, temperature 98.1 F (36.7 C), temperature source Oral, resp. rate 18, height 5\' 6"  (1.676 m), weight 81 kg (178 lb 9.6 oz), SpO2 100 %.  PHYSICAL EXAMINATION:  GENERAL:  56 y.o.-year-old patient lying in the bed with no acute distress.  EYES: Pupils equal, round, reactive to light and accommodation. No scleral icterus. Extraocular muscles intact.  HEENT: Head atraumatic, normocephalic. Oropharynx and nasopharynx clear.  NECK:  Supple, no jugular venous distention. No thyroid enlargement, no tenderness.  LUNGS: Normal breath sounds bilaterally, no wheezing, rales,rhonchi or crepitation. No use of accessory muscles of respiration.  CARDIOVASCULAR: S1, S2 normal. No murmurs, rubs, or gallops.  ABDOMEN: Soft, nontender, nondistended. Bowel sounds present. No organomegaly or mass.  EXTREMITIES: No pedal edema, cyanosis, or clubbing.  NEUROLOGIC: Cranial nerves II through XII are intact. Muscle strength 5/5 in all extremities. Sensation intact. Gait not checked.  PSYCHIATRIC: The patient is alert and oriented x 3.  SKIN: No obvious rash, lesion, or ulcer.    LABORATORY PANEL:   CBC Recent Labs  Lab 09/04/17 0541  WBC 4.1  HGB 12.4  HCT 38.6  PLT 132*    ------------------------------------------------------------------------------------------------------------------  Chemistries  Recent Labs  Lab 09/03/17 1500 09/04/17 0541  NA 136 138  K 5.2* 4.0  CL 97* 98*  CO2 25 27  GLUCOSE 212* 252*  BUN 16 22*  CREATININE 4.97* 6.03*  CALCIUM 8.1* 8.2*  AST 44*  --   ALT 19  --   ALKPHOS 127*  --   BILITOT 1.2  --    ------------------------------------------------------------------------------------------------------------------  Cardiac Enzymes Recent Labs  Lab 09/03/17 1500  TROPONINI <0.03   ------------------------------------------------------------------------------------------------------------------  RADIOLOGY:  Ct Abdomen Pelvis Wo Contrast  Result Date: 09/03/2017 CLINICAL DATA:  Abdominal pain with nausea and vomiting. EXAM: CT ABDOMEN AND PELVIS WITHOUT CONTRAST TECHNIQUE: Multidetector CT imaging of the abdomen and pelvis was performed following the standard protocol without IV contrast. COMPARISON:  06/09/2017 FINDINGS: Lower chest: Heart size upper normal to mildly increased. Tiny right pleural effusion noted. Hepatobiliary: No focal abnormality within the hepatic parenchyma. Probable component of diffuse intrahepatic biliary duct distension with extrahepatic common duct fully visualized on this noncontrast study. Gallbladder surgically absent. Pancreas: No focal mass lesion. No dilatation of the main duct. No intraparenchymal cyst. No peripancreatic edema. Spleen: No splenomegaly. No focal mass lesion. Adrenals/Urinary Tract: No adrenal nodule or mass. Kidneys are atrophic. No evidence for hydroureter. The urinary bladder appears normal for the degree of distention. Stomach/Bowel: Small hiatal hernia. Stomach otherwise unremarkable. Duodenum is normally positioned as is the ligament of Treitz. No small bowel wall thickening. No small bowel dilatation. The terminal ileum is normal. The appendix is not visualized, but  there is no edema or inflammation in the region of the cecum. Diverticuli are seen scattered along the entire length of the colon without CT findings of diverticulitis. Vascular/Lymphatic: There is abdominal aortic atherosclerosis without aneurysm. There is no gastrohepatic or hepatoduodenal ligament lymphadenopathy. No intraperitoneal or retroperitoneal lymphadenopathy. No pelvic sidewall lymphadenopathy. Reproductive: Uterus surgically absent.  There is no adnexal mass. Other: No intraperitoneal free fluid. Musculoskeletal: Bone windows reveal no worrisome lytic or sclerotic osseous lesions. Superior endplate Schmorl's node seen at L2. IMPRESSION: 1. No acute findings in the abdomen or pelvis on this noncontrast exam. 2. Diffuse intra and extrahepatic biliary duct prominence. 3. Small hiatal hernia. 4. Atrophic kidneys. Enhancing renal lesions seen on previous exam not evident on today's noncontrast study. 5.  Aortic Atherosclerois (ICD10-170.0) 6. Diffuse colonic diverticulosis. Electronically Signed   By: Misty Stanley M.D.   On: 09/03/2017 20:21   Dg Abdomen 1 View  Result Date: 09/03/2017 CLINICAL DATA:  Vomiting and abdominal pain EXAM: ABDOMEN - 1 VIEW COMPARISON:  None. FINDINGS: The bowel gas pattern is normal. Multiple calcifications overlie the lower abdomen and pelvis. Visualized bones are normal. IMPRESSION: Nonobstructive bowel gas pattern. Electronically Signed   By: Ulyses Jarred M.D.   On: 09/03/2017 18:09    EKG:   Orders placed or performed during the hospital encounter of 09/03/17  . ED EKG  . ED EKG  . EKG 12-Lead  . EKG 12-Lead    ASSESSMENT AND PLAN:  56 year old female with nausea soca, vomiting, abdominal pain secondary to the diabetic gastroparesis.  Patient is on Reglan, she is feeling better and will start her on clear liquids today.  Patient imaging, lab data are  largely  With in normal.  2 . ESRD on hemodialysis, nephrology is consulted. Chronic diastolic heart  failure: Stable. 4.  COPD: No wheezing. Likely discharge tomorrow.   All the records are reviewed and case discussed with Care Management/Social Workerr. Management plans discussed with the patient, family and they are in agreement.  CODE STATUS: full  TOTAL TIME TAKING CARE OF THIS PATIENT:23minutes.   POSSIBLE D/C IN 1-2DAYS, DEPENDING ON CLINICAL CONDITION.   Epifanio Lesches M.D on 09/04/2017 at 9:15 AM  Between 7am to 6pm - Pager - [phone]  After 6pm go to [email or web address] - password [email or web address] [facility]  Office  [phone]  CC: Primary care physician; Ellamae Sia, MD   Note: This dictation was prepared with Dragon dictation along with smaller phrase technology. Any transcriptional errors that result from this process are unintentional.

## 2017-09-04 NOTE — Care Management (Signed)
Amanda Morris HD liaison notified of admission.  

## 2017-09-04 NOTE — Progress Notes (Addendum)
Nurse tech reported that patient blood glucose was 35, patient meal tray ordered and patient given apple juice. Will recheck in 30 minutes. Patient awake and oriented x4. Dialysis nurse reported prior to patient coming back to floor that patient was really sleepy post dialysis treatment.

## 2017-09-04 NOTE — Progress Notes (Signed)
HD tx end  

## 2017-09-04 NOTE — Care Management Obs Status (Signed)
[facility] NOTIFICATION   Patient Details  Name: DENIA MCVICAR MRN: [id number] Date of Birth: 12/16/1960   Medicare Observation Status Notification Given:  Yes(Hard copy signed.  Patient unable to sign using computer. )    Beverly Sessions, RN 09/04/2017, 3:32 PM

## 2017-09-05 DIAGNOSIS — R1011 Right upper quadrant pain: Secondary | ICD-10-CM | POA: Diagnosis not present

## 2017-09-05 LAB — HEMOGLOBIN A1C
Hgb A1c MFr Bld: 6.5 % — ABNORMAL HIGH (ref 4.8–5.6)
Mean Plasma Glucose: 140 mg/dL

## 2017-09-05 LAB — GLUCOSE, CAPILLARY
Glucose-Capillary: 72 mg/dL (ref 65–99)
Glucose-Capillary: 97 mg/dL (ref 65–99)

## 2017-09-05 LAB — PARATHYROID HORMONE, INTACT (NO CA): PTH: 194 pg/mL — ABNORMAL HIGH (ref 15–65)

## 2017-09-05 MED ORDER — INSULIN DETEMIR 100 UNIT/ML ~~LOC~~ SOLN: 2.0000 [IU] | Freq: Two times a day (BID) | SUBCUTANEOUS | 11 refills | Status: DC

## 2017-09-05 MED ORDER — INSULIN DETEMIR 100 UNIT/ML ~~LOC~~ SOLN: 4.0000 [IU] | Freq: Two times a day (BID) | SUBCUTANEOUS | 11 refills | Status: DC

## 2017-09-05 NOTE — Discharge Summary (Signed)
[name], is a 56 y.o. female  DOB 1961-09-04  MRN [id number].  Admission date:  09/03/2017  Admitting Physician  Lance Coon, MD  Discharge Date:  09/05/2017   Primary MD  Ellamae Sia, MD  Recommendations for primary care physician for things to follow:  Follow-up with PCP in 1 week Routine hemodialysis Monday, Wednesday, Friday.   Admission Diagnosis  Right upper quadrant abdominal pain [R10.11] Intractable vomiting with nausea, unspecified vomiting type [R11.2]   Discharge Diagnosis  Right upper quadrant abdominal pain [R10.11] Intractable vomiting with nausea, unspecified vomiting type [R11.2]    Principal Problem:   Abdominal pain Active Problems:   End-stage renal disease on hemodialysis (HCC)   DM (diabetes mellitus) ([place])   HTN (hypertension)   Coronary artery disease involving native coronary artery of native heart without angina pectoris   Nausea & vomiting   Diabetic gastroparesis ([place])   Depression      Past Medical History:  Diagnosis Date  . Anemia   . Anginal pain ([place])   . Anxiety   . Arthritis   . Asthma   . Broken wrist   . Bronchitis   . chronic diastolic CHF 12/29/8117  . COPD (chronic obstructive pulmonary disease) ([place])   . Coronary artery disease    a. cath 2013: stenting to RCA (report not available); b. cath 2014: LM nl, pLAD 40%, mLAD nl, ost LCx 40%, mid LCx nl, pRCA 30% @ site of prior stent, mRCA 50%  . Depression   . Diabetes mellitus without complication ([place])   . Diabetic neuropathy ([place])   . dialysis 2006  . Diverticulosis   . Dizziness   . Dyspnea   . Elevated lipids   . Environmental and seasonal allergies   . ESRD (end stage renal disease) on dialysis ([place])    M-W-F  . GERD (gastroesophageal reflux disease)   . Headache   . History of hiatal hernia   .  HOH (hard of hearing)   . Hx of pancreatitis 2015  . Hypertension   . Lower extremity edema   . Mitral regurgitation    a. echo 10/2013: EF 62%, noWMA, mildly dilated LA, mild to mod MR/TR, GR1DD  . Myocardial infarction ([place])   . Orthopnea   . Pneumonia   . Renal cancer ([place])   . Renal insufficiency    Pt is on dialysis on M,W + F.  . Wheezing     Past Surgical History:  Procedure Laterality Date  . ABDOMINAL HYSTERECTOMY  1992  . APPENDECTOMY    . CARDIAC CATHETERIZATION Left 07/26/2015   Procedure: Left Heart Cath and Coronary Angiography;  Surgeon: Dionisio David, MD;  Location: [facility];  Service: Cardiovascular;  Laterality: Left;  . CATARACT EXTRACTION W/ INTRAOCULAR LENS IMPLANT Right   . CATARACT EXTRACTION W/PHACO Left 03/10/2017   Procedure: CATARACT EXTRACTION PHACO AND INTRAOCULAR LENS PLACEMENT (IOC);  Surgeon: Birder Robson, MD;  Location: [facility];  Service: Ophthalmology;  Laterality: Left;  [place] 00:51.9 AP% 14.2 CDE 7.39 fluid pack lot # [id number]  . CHOLECYSTECTOMY    . COLONOSCOPY WITH PROPOFOL N/A 08/12/2016   Procedure: COLONOSCOPY WITH PROPOFOL;  Surgeon: Lollie Sails, MD;  Location: [facility] ENDOSCOPY;  Service: Endoscopy;  Laterality: N/A;  . DIALYSIS FISTULA CREATION Left    upper arm  . dialysis grafts    . ESOPHAGOGASTRODUODENOSCOPY N/A 03/08/2015   Procedure: ESOPHAGOGASTRODUODENOSCOPY (EGD);  Surgeon: Manya Silvas, MD;  Location: [facility];  Service: Endoscopy;  Laterality: N/A;  . ESOPHAGOGASTRODUODENOSCOPY (EGD) WITH PROPOFOL N/A 03/18/2016   Procedure: ESOPHAGOGASTRODUODENOSCOPY (EGD) WITH PROPOFOL;  Surgeon: [name], MD;  Location: [facility] ENDOSCOPY;  Service: Endoscopy;  Laterality: N/A;  . EYE SURGERY Right 2018  . FECAL TRANSPLANT N/A 08/23/2015   Procedure: FECAL TRANSPLANT;  Surgeon: Manya Silvas, MD;  Location: [facility] ENDOSCOPY;  Service: Endoscopy;  Laterality: N/A;  . HAND SURGERY Bilateral   . PERIPHERAL VASCULAR  CATHETERIZATION N/A 12/20/2015   Procedure: Thrombectomy of dialysis access versus permcath placement;  Surgeon: Algernon Huxley, MD;  Location: [facility];  Service: Cardiovascular;  Laterality: N/A;  . PERIPHERAL VASCULAR CATHETERIZATION N/A 12/20/2015   Procedure: A/V Shunt Intervention;  Surgeon: Algernon Huxley, MD;  Location: [facility];  Service: Cardiovascular;  Laterality: N/A;  . PERIPHERAL VASCULAR CATHETERIZATION N/A 12/20/2015   Procedure: A/V Shuntogram/Fistulagram;  Surgeon: Algernon Huxley, MD;  Location: [place];  Service: Cardiovascular;  Laterality: N/A;  . PERIPHERAL VASCULAR CATHETERIZATION N/A 01/02/2016   Procedure: A/V Shuntogram/Fistulagram;  Surgeon: Algernon Huxley, MD;  Location: [place];  Service: Cardiovascular;  Laterality: N/A;  . PERIPHERAL VASCULAR CATHETERIZATION N/A 01/02/2016   Procedure: A/V Shunt Intervention;  Surgeon: Algernon Huxley, MD;  Location: [facility];  Service: Cardiovascular;  Laterality: N/A;       History of present illness and  Hospital Course:     Kindly see H&P for history of present illness and admission details, please review complete Labs, Consult reports and Test reports for all details in brief  HPI  from the history and physical done on the day of admission 56 year old female patient waited for abdominal pain, intractable nausea, vomiting unable to keep anything down for 24 hours.  Patient admitted to observation status for intractable nausea, and vomiting.  Hospital Course  #1 generalized abdominal pain and intractable nausea and vomiting: Likely secondary to acute gastritis and also diabetic gastroparesis.  Patient lab data largely within normal limits since admission.  Started on IV Reglan, started on clear liquids.  She feels much better today has little nausea but overall denies any abdominal pain and wants to eat solid food.  And if she tolerates solid diet and she can be discharged by  afternoon.  Discussed this with patient and also registered nurse.  2.  ESRD: On hemodialysis Monday, Wednesday, Friday, patient received hemodialysis yesterday.  3.  COPD: No wheezing.  4.  Diabetes mellitus type 2 with hypoglycemia secondary to poor p.o. intake: Sugar dropped to as low as 35 yesterday.  Improved with D50 to 170.  Monitor.  She takes Levemir 40 units twice daily but I decreased the dose to 2 L twice daily, continue NovoLog 4-12 units 3 times daily as per sliding scale insulin.    Discharge Condition:    Follow UP  Follow-up Information    Burns, Harriett P, MD. Schedule an appointment as soon as possible for a visit in 1 week(s).   Specialty:  Internal Medicine Contact information: [place] [address] [phone]             Discharge Instructions  and  Discharge Medications     Allergies as of 09/05/2017      Reactions   Ace Inhibitors Swelling, Anaphylaxis   Ativan [lorazepam] Other (See Comments)   Reaction:Hallucinations and headaches Reaction:Hallucinations and headaches Reaction:  Hallucinations and headaches   Compazine [prochlorperazine Edisylate] Anaphylaxis, Nausea And Vomiting  Other reaction(s): dystonia from this vs. reglan 23 Jul - patient relates that she takes promethazine frequently with no problems. 23 Jul - patient relates that she takes promethazine frequently with no problems.   Sumatriptan Succinate    Other reaction(s): delirium and hallucinations per [facility] records   Dilaudid [hydromorphone Hcl] Other (See Comments)   Delirium   Ondansetron Other (See Comments)   hallucinations    Zofran [ondansetron Hcl] Other (See Comments)   Reaction:  hallucinations  Reaction:  hallucinations    Codeine Nausea And Vomiting   Gabapentin Other (See Comments)   Reaction:  Unknown    [place] Nausea And Vomiting   Losartan Nausea Only   Oxycodone Anxiety   Prochlorperazine Other (See Comments)    Reaction:  Unknown . Patient does not remember reaction but she does have vertigo and anxiety along with n and v at times. Could be used to treat any of these   Reglan [metoclopramide]    Per patient her Dr. Evelina Bucy her off it  Per patient her Dr. Evelina Bucy her off it    Scopolamine Other (See Comments)   Dizziness, also has vertigo already   Tape Rash   Tapentadol Rash      Medication List    STOP taking these medications   LEVEMIR FLEXTOUCH 100 UNIT/ML Pen Generic drug:  Insulin Detemir Replaced by:  insulin detemir 100 UNIT/ML injection     TAKE these medications   acidophilus Caps capsule Take 1 capsule by mouth daily with lunch.   albuterol 108 (90 Base) MCG/ACT inhaler Commonly known as:  PROVENTIL HFA;VENTOLIN HFA Inhale 2 puffs into the lungs every 6 (six) hours as needed for shortness of breath.   albuterol (2.5 MG/3ML) 0.083% nebulizer solution Commonly known as:  PROVENTIL Take 2.5 mg by nebulization every 6 (six) hours as needed for wheezing or shortness of breath.   alum & mag hydroxide-simeth 324-401-02 MG/5ML suspension Commonly known as:  MAALOX MAX Take 5 mLs by mouth every 6 (six) hours as needed for indigestion.   amLODipine 10 MG tablet Commonly known as:  NORVASC Take 10 mg by mouth daily.   aspirin EC 81 MG tablet Take 81 mg by mouth daily.   atorvastatin 40 MG tablet Commonly known as:  LIPITOR Take 20 mg by mouth at bedtime.   cetirizine 10 MG tablet Commonly known as:  ZYRTEC Take 10 mg by mouth daily.   clopidogrel 75 MG tablet Commonly known as:  PLAVIX Take 1 tablet (75 mg total) by mouth daily.   cyanocobalamin 1000 MCG tablet Take 1,000 mcg by mouth daily with lunch.   diclofenac sodium 1 % Gel Commonly known as:  VOLTAREN Apply 2 g topically 2 (two) times daily as needed (for leg pain).   diphenhydrAMINE 25 mg capsule Commonly known as:  BENADRYL Take 25 mg by mouth every 6 (six) hours as needed for itching.   famotidine 20 MG  tablet Commonly known as:  PEPCID Take 1 tablet (20 mg total) by mouth 2 (two) times daily.   FLUoxetine 20 MG capsule Commonly known as:  PROZAC Take 1 capsule (20 mg total) by mouth daily. What changed:  when to take this   glucose 4 GM chewable tablet Chew 3-4 tablets by mouth as needed for low blood sugar (blood sugar less than 60).   hydrALAZINE 25 MG tablet Commonly known as:  APRESOLINE Take 25 mg by mouth 3 (three) times daily.   HYDROcodone-acetaminophen 5-325 MG tablet Commonly known  as:  NORCO/VICODIN Take 1 tablet by mouth every 6 (six) hours as needed for moderate pain or severe pain.   hydrOXYzine 25 MG tablet Commonly known as:  ATARAX/VISTARIL Take 25 mg by mouth daily.   insulin detemir 100 UNIT/ML injection Commonly known as:  LEVEMIR Inject 0.04 mLs (4 Units total) into the skin 2 (two) times daily. Replaces:  LEVEMIR FLEXTOUCH 100 UNIT/ML Pen   lipase/protease/amylase 12000 units Cpep capsule Commonly known as:  CREON Take 24,000-36,000 Units by mouth 3 (three) times daily with meals. Take 36000 mg in the morning, 24000 midday & 36000 in the evening   LORazepam 1 MG tablet Commonly known as:  ATIVAN Take 1 tablet (1 mg total) by mouth 2 (two) times daily.   meclizine 25 MG tablet Commonly known as:  ANTIVERT 25 mg   metoCLOPramide 10 MG tablet Commonly known as:  REGLAN Take 1 tablet (10 mg total) by mouth every 8 (eight) hours as needed for nausea or vomiting.   multivitamin Tabs tablet Take 1 tablet by mouth daily.   nitroGLYCERIN 0.4 MG SL tablet Commonly known as:  NITROSTAT Place 0.4 mg under the tongue every 5 (five) minutes x 3 doses as needed.   NOVOLOG FLEXPEN 100 UNIT/ML FlexPen Generic drug:  insulin aspart Inject 4-12 Units into the skin 3 (three) times daily before meals. Per sliding scale   OLANZapine 5 MG tablet Commonly known as:  ZYPREXA Take 0.5 tablets (2.5 mg total) by mouth at bedtime. What changed:  how much to  take   omega-3 acid ethyl esters 1 g capsule Commonly known as:  LOVAZA Take 2 g by mouth 2 (two) times daily.   pantoprazole 40 MG tablet Commonly known as:  PROTONIX Take 1 tablet (40 mg total) by mouth 2 (two) times daily before a meal.   pregabalin 25 MG capsule Commonly known as:  LYRICA Take 25 mg by mouth daily.   promethazine 25 MG tablet Commonly known as:  PHENERGAN Take 12.5 mg by mouth every 6 (six) hours as needed for nausea or vomiting.   ranitidine 150 MG tablet Commonly known as:  ZANTAC Take 150 mg by mouth 2 (two) times daily. In the afternoon   rifaximin 550 MG Tabs tablet Commonly known as:  XIFAXAN Take 550 mg by mouth 2 (two) times daily.   sevelamer carbonate 800 MG tablet Commonly known as:  RENVELA Take 800 mg by mouth 3 (three) times daily with meals.         Diet and Activity recommendation: See Discharge Instructions above   Consults obtained - nephrology   Major procedures and Radiology Reports - PLEASE review detailed and final reports for all details, in brief -      Ct Abdomen Pelvis Wo Contrast  Result Date: 09/03/2017 CLINICAL DATA:  Abdominal pain with nausea and vomiting. EXAM: CT ABDOMEN AND PELVIS WITHOUT CONTRAST TECHNIQUE: Multidetector CT imaging of the abdomen and pelvis was performed following the standard protocol without IV contrast. COMPARISON:  06/09/2017 FINDINGS: Lower chest: Heart size upper normal to mildly increased. Tiny right pleural effusion noted. Hepatobiliary: No focal abnormality within the hepatic parenchyma. Probable component of diffuse intrahepatic biliary duct distension with extrahepatic common duct fully visualized on this noncontrast study. Gallbladder surgically absent. Pancreas: No focal mass lesion. No dilatation of the main duct. No intraparenchymal cyst. No peripancreatic edema. Spleen: No splenomegaly. No focal mass lesion. Adrenals/Urinary Tract: No adrenal nodule or mass. Kidneys are atrophic.  No evidence for hydroureter. The  urinary bladder appears normal for the degree of distention. Stomach/Bowel: Small hiatal hernia. Stomach otherwise unremarkable. Duodenum is normally positioned as is the ligament of Treitz. No small bowel wall thickening. No small bowel dilatation. The terminal ileum is normal. The appendix is not visualized, but there is no edema or inflammation in the region of the cecum. Diverticuli are seen scattered along the entire length of the colon without CT findings of diverticulitis. Vascular/Lymphatic: There is abdominal aortic atherosclerosis without aneurysm. There is no gastrohepatic or hepatoduodenal ligament lymphadenopathy. No intraperitoneal or retroperitoneal lymphadenopathy. No pelvic sidewall lymphadenopathy. Reproductive: Uterus surgically absent.  There is no adnexal mass. Other: No intraperitoneal free fluid. Musculoskeletal: Bone windows reveal no worrisome lytic or sclerotic osseous lesions. Superior endplate Schmorl's node seen at L2. IMPRESSION: 1. No acute findings in the abdomen or pelvis on this noncontrast exam. 2. Diffuse intra and extrahepatic biliary duct prominence. 3. Small hiatal hernia. 4. Atrophic kidneys. Enhancing renal lesions seen on previous exam not evident on today's noncontrast study. 5.  Aortic Atherosclerois (ICD10-170.0) 6. Diffuse colonic diverticulosis. Electronically Signed   By: Misty Stanley M.D.   On: 09/03/2017 20:21   Dg Shoulder Right  Result Date: 08/14/2017 CLINICAL DATA:  Right shoulder pain for 3 days.  No known injury. EXAM: RIGHT SHOULDER - 2+ VIEW COMPARISON:  None. FINDINGS: No acute bony or joint abnormality is identified. Mild acromioclavicular degenerative change is seen. Imaged lung parenchyma is clear. Vascular stents in the right axilla and upper arm are noted. IMPRESSION: No acute abnormality. Mild acromioclavicular osteoarthritis. Electronically Signed   By: Inge Rise M.D.   On: 08/14/2017 15:04   Dg Abdomen  1 View  Result Date: 09/03/2017 CLINICAL DATA:  Vomiting and abdominal pain EXAM: ABDOMEN - 1 VIEW COMPARISON:  None. FINDINGS: The bowel gas pattern is normal. Multiple calcifications overlie the lower abdomen and pelvis. Visualized bones are normal. IMPRESSION: Nonobstructive bowel gas pattern. Electronically Signed   By: Ulyses Jarred M.D.   On: 09/03/2017 18:09   Ct Head Wo Contrast  Result Date: 08/14/2017 CLINICAL DATA:  Headache and right arm pain since last dialysis treatment on Tuesday. No known injury. EXAM: CT HEAD WITHOUT CONTRAST TECHNIQUE: Contiguous axial images were obtained from the base of the skull through the vertex without intravenous contrast. COMPARISON:  06/09/2017 FINDINGS: Brain: No evidence of acute infarction, hemorrhage, hydrocephalus, extra-axial collection or mass lesion/mass effect. Midline fourth ventricle and basal cisterns. Vascular: No hyperdense vessel or unexpected calcification. Moderate atherosclerosis of the cavernous sinus carotids and both vertebral arteries. Skull: Normal. Negative for fracture or focal lesion. Sinuses/Orbits: No acute finding. Other: None IMPRESSION: No acute intracranial abnormality. Electronically Signed   By: Ashley Royalty M.D.   On: 08/14/2017 14:05    Micro Results    No results found for this or any previous visit (from the past 240 hour(s)).     Today   Subjective:   [name] today has no headache,no chest abdominal pain,no new weakness tingling or numbness, feels much better wants to go home today.   Objective:   Blood pressure (!) 145/45, pulse (!) 59, temperature 97.9 F (36.6 C), temperature source Oral, resp. rate 16, height 5\' 6"  (1.676 m), weight 84.1 kg (185 lb 4.8 oz), SpO2 99 %.   Intake/Output Summary (Last 24 hours) at 09/05/2017 0953 Last data filed at 09/05/2017 0300 Gross per 24 hour  Intake 660 ml  Output 1349 ml  Net -689 ml    Exam Awake Alert, Oriented  x 3, No new F.N deficits, Normal  affect [place].AT,PERRAL Supple Neck,No JVD, No cervical lymphadenopathy appriciated.  Symmetrical Chest wall movement, Good air movement bilaterally, CTAB RRR,No Gallops,Rubs or new Murmurs, No Parasternal Heave +ve B.Sounds, Abd Soft, Non tender, No organomegaly appriciated, No rebound -guarding or rigidity. No Cyanosis, Clubbing or edema, No new Rash or bruise  Data Review   CBC w Diff:  Lab Results  Component Value Date   WBC 4.1 09/04/2017   HGB 12.4 09/04/2017   HGB 10.5 (L) 09/15/2014   HCT 38.6 09/04/2017   HCT 34.2 (L) 09/15/2014   PLT 132 (L) 09/04/2017   PLT 203 09/15/2014   LYMPHOPCT 25 06/09/2017   LYMPHOPCT 51.6 05/31/2014   BANDSPCT 0 04/07/2016   MONOPCT 8 06/09/2017   MONOPCT 9.6 05/31/2014   EOSPCT 3 06/09/2017   EOSPCT 4.6 05/31/2014   BASOPCT 1 06/09/2017   BASOPCT 0.4 05/31/2014    CMP:  Lab Results  Component Value Date   NA 138 09/04/2017   NA 135 (L) 09/15/2014   K 4.0 09/04/2017   K 4.8 09/15/2014   CL 98 (L) 09/04/2017   CL 99 09/15/2014   CO2 27 09/04/2017   CO2 26 09/15/2014   BUN 22 (H) 09/04/2017   BUN 19 (H) 09/15/2014   CREATININE 6.03 (H) 09/04/2017   CREATININE 6.79 (H) 09/15/2014   PROT 7.8 09/03/2017   PROT 7.2 09/15/2014   ALBUMIN 3.8 09/03/2017   ALBUMIN 3.0 (L) 09/15/2014   BILITOT 1.2 09/03/2017   BILITOT 0.5 09/15/2014   ALKPHOS 127 (H) 09/03/2017   ALKPHOS 105 09/15/2014   AST 44 (H) 09/03/2017   AST 43 (H) 09/15/2014   ALT 19 09/03/2017   ALT 16 09/15/2014  .   Total Time in preparing paper work, data evaluation and todays exam - 35 minutes  Epifanio Lesches M.D on 09/05/2017 at 9:53 AM    Note: This dictation was prepared with Dragon dictation along with smaller phrase technology. Any transcriptional errors that result from this process are unintentional.

## 2017-09-05 NOTE — Care Management (Signed)
No discharge needs identified by members of the care team 

## 2017-09-05 NOTE — Progress Notes (Signed)
Tia Masker Bart  A and O x 4. VSS. Pt tolerating diet well. No complaints of pain or nausea. IV removed intact, prescriptions given. Pt voiced understanding of discharge instructions with no further questions. Pt discharged via wheelchair with nursing aide.  Lynann Bologna MSN, RN-BC  Allergies as of 09/05/2017      Reactions   Ace Inhibitors Swelling, Anaphylaxis   Ativan [lorazepam] Other (See Comments)   Reaction:Hallucinations and headaches Reaction:Hallucinations and headaches Reaction:  Hallucinations and headaches   Compazine [prochlorperazine Edisylate] Anaphylaxis, Nausea And Vomiting   Other reaction(s): dystonia from this vs. reglan 23 Jul - patient relates that she takes promethazine frequently with no problems. 23 Jul - patient relates that she takes promethazine frequently with no problems.   Sumatriptan Succinate    Other reaction(s): delirium and hallucinations per [facility] records   Dilaudid [hydromorphone Hcl] Other (See Comments)   Delirium   Ondansetron Other (See Comments)   hallucinations    Zofran [ondansetron Hcl] Other (See Comments)   Reaction:  hallucinations  Reaction:  hallucinations    Codeine Nausea And Vomiting   Gabapentin Other (See Comments)   Reaction:  Unknown    [place] Nausea And Vomiting   Losartan Nausea Only   Oxycodone Anxiety   Prochlorperazine Other (See Comments)   Reaction:  Unknown . Patient does not remember reaction but she does have vertigo and anxiety along with n and v at times. Could be used to treat any of these   Reglan [metoclopramide]    Per patient her Dr. Evelina Bucy her off it  Per patient her Dr. Evelina Bucy her off it    Scopolamine Other (See Comments)   Dizziness, also has vertigo already   Tape Rash   Tapentadol Rash      Medication List    STOP taking these medications   LEVEMIR FLEXTOUCH 100 UNIT/ML Pen Generic drug:  Insulin Detemir Replaced by:  insulin detemir 100 UNIT/ML injection     TAKE these medications    acidophilus Caps capsule Take 1 capsule by mouth daily with lunch.   albuterol 108 (90 Base) MCG/ACT inhaler Commonly known as:  PROVENTIL HFA;VENTOLIN HFA Inhale 2 puffs into the lungs every 6 (six) hours as needed for shortness of breath.   albuterol (2.5 MG/3ML) 0.083% nebulizer solution Commonly known as:  PROVENTIL Take 2.5 mg by nebulization every 6 (six) hours as needed for wheezing or shortness of breath.   alum & mag hydroxide-simeth 509-326-71 MG/5ML suspension Commonly known as:  MAALOX MAX Take 5 mLs by mouth every 6 (six) hours as needed for indigestion.   amLODipine 10 MG tablet Commonly known as:  NORVASC Take 10 mg by mouth daily.   aspirin EC 81 MG tablet Take 81 mg by mouth daily.   atorvastatin 40 MG tablet Commonly known as:  LIPITOR Take 20 mg by mouth at bedtime.   cetirizine 10 MG tablet Commonly known as:  ZYRTEC Take 10 mg by mouth daily.   clopidogrel 75 MG tablet Commonly known as:  PLAVIX Take 1 tablet (75 mg total) by mouth daily.   cyanocobalamin 1000 MCG tablet Take 1,000 mcg by mouth daily with lunch.   diclofenac sodium 1 % Gel Commonly known as:  VOLTAREN Apply 2 g topically 2 (two) times daily as needed (for leg pain).   diphenhydrAMINE 25 mg capsule Commonly known as:  BENADRYL Take 25 mg by mouth every 6 (six) hours as needed for itching.   famotidine 20 MG tablet Commonly  known as:  PEPCID Take 1 tablet (20 mg total) by mouth 2 (two) times daily.   FLUoxetine 20 MG capsule Commonly known as:  PROZAC Take 1 capsule (20 mg total) by mouth daily. What changed:  when to take this   glucose 4 GM chewable tablet Chew 3-4 tablets by mouth as needed for low blood sugar (blood sugar less than 60).   hydrALAZINE 25 MG tablet Commonly known as:  APRESOLINE Take 25 mg by mouth 3 (three) times daily.   HYDROcodone-acetaminophen 5-325 MG tablet Commonly known as:  NORCO/VICODIN Take 1 tablet by mouth every 6 (six) hours as  needed for moderate pain or severe pain.   hydrOXYzine 25 MG tablet Commonly known as:  ATARAX/VISTARIL Take 25 mg by mouth daily.   insulin detemir 100 UNIT/ML injection Commonly known as:  LEVEMIR Inject 0.02 mLs (2 Units total) into the skin 2 (two) times daily. Replaces:  LEVEMIR FLEXTOUCH 100 UNIT/ML Pen   lipase/protease/amylase 12000 units Cpep capsule Commonly known as:  CREON Take 24,000-36,000 Units by mouth 3 (three) times daily with meals. Take 36000 mg in the morning, 24000 midday & 36000 in the evening   LORazepam 1 MG tablet Commonly known as:  ATIVAN Take 1 tablet (1 mg total) by mouth 2 (two) times daily.   meclizine 25 MG tablet Commonly known as:  ANTIVERT 25 mg   metoCLOPramide 10 MG tablet Commonly known as:  REGLAN Take 1 tablet (10 mg total) by mouth every 8 (eight) hours as needed for nausea or vomiting.   multivitamin Tabs tablet Take 1 tablet by mouth daily.   nitroGLYCERIN 0.4 MG SL tablet Commonly known as:  NITROSTAT Place 0.4 mg under the tongue every 5 (five) minutes x 3 doses as needed.   NOVOLOG FLEXPEN 100 UNIT/ML FlexPen Generic drug:  insulin aspart Inject 4-12 Units into the skin 3 (three) times daily before meals. Per sliding scale   OLANZapine 5 MG tablet Commonly known as:  ZYPREXA Take 0.5 tablets (2.5 mg total) by mouth at bedtime. What changed:  how much to take   omega-3 acid ethyl esters 1 g capsule Commonly known as:  LOVAZA Take 2 g by mouth 2 (two) times daily.   pantoprazole 40 MG tablet Commonly known as:  PROTONIX Take 1 tablet (40 mg total) by mouth 2 (two) times daily before a meal.   pregabalin 25 MG capsule Commonly known as:  LYRICA Take 25 mg by mouth daily.   promethazine 25 MG tablet Commonly known as:  PHENERGAN Take 12.5 mg by mouth every 6 (six) hours as needed for nausea or vomiting.   ranitidine 150 MG tablet Commonly known as:  ZANTAC Take 150 mg by mouth 2 (two) times daily. In the  afternoon   rifaximin 550 MG Tabs tablet Commonly known as:  XIFAXAN Take 550 mg by mouth 2 (two) times daily.   sevelamer carbonate 800 MG tablet Commonly known as:  RENVELA Take 800 mg by mouth 3 (three) times daily with meals.       Vitals:   09/05/17 0932 09/05/17 1431  BP: (!) 145/45 (!) 148/46  Pulse:  66  Resp:  20  Temp:  98.4 F (36.9 C)  SpO2:  100%

## 2017-09-05 NOTE — Progress Notes (Addendum)
Per Dr. Vianne Bulls hold Levemir as blood glucose was low this AM. Per MD okay to place order for renal diet with 1280ml fluid restriction.

## 2017-09-09 IMAGING — CR DG CHEST 2V
1 series · 2 of 2 positions shown · non-contrast
Comparison: Portable chest x-ray April 01, 2017

CLINICAL DATA: Status post fall last month with persistent left
ribcage pain. History of asthma, COPD, former smoker, CHF, diabetes,
dialysis dependent renal failure

EXAM:
CHEST  2 VIEW

[Series 1: dg chest 2 view · 0.14mm/px · 2 of 2 slices shown]
[im 1/2]
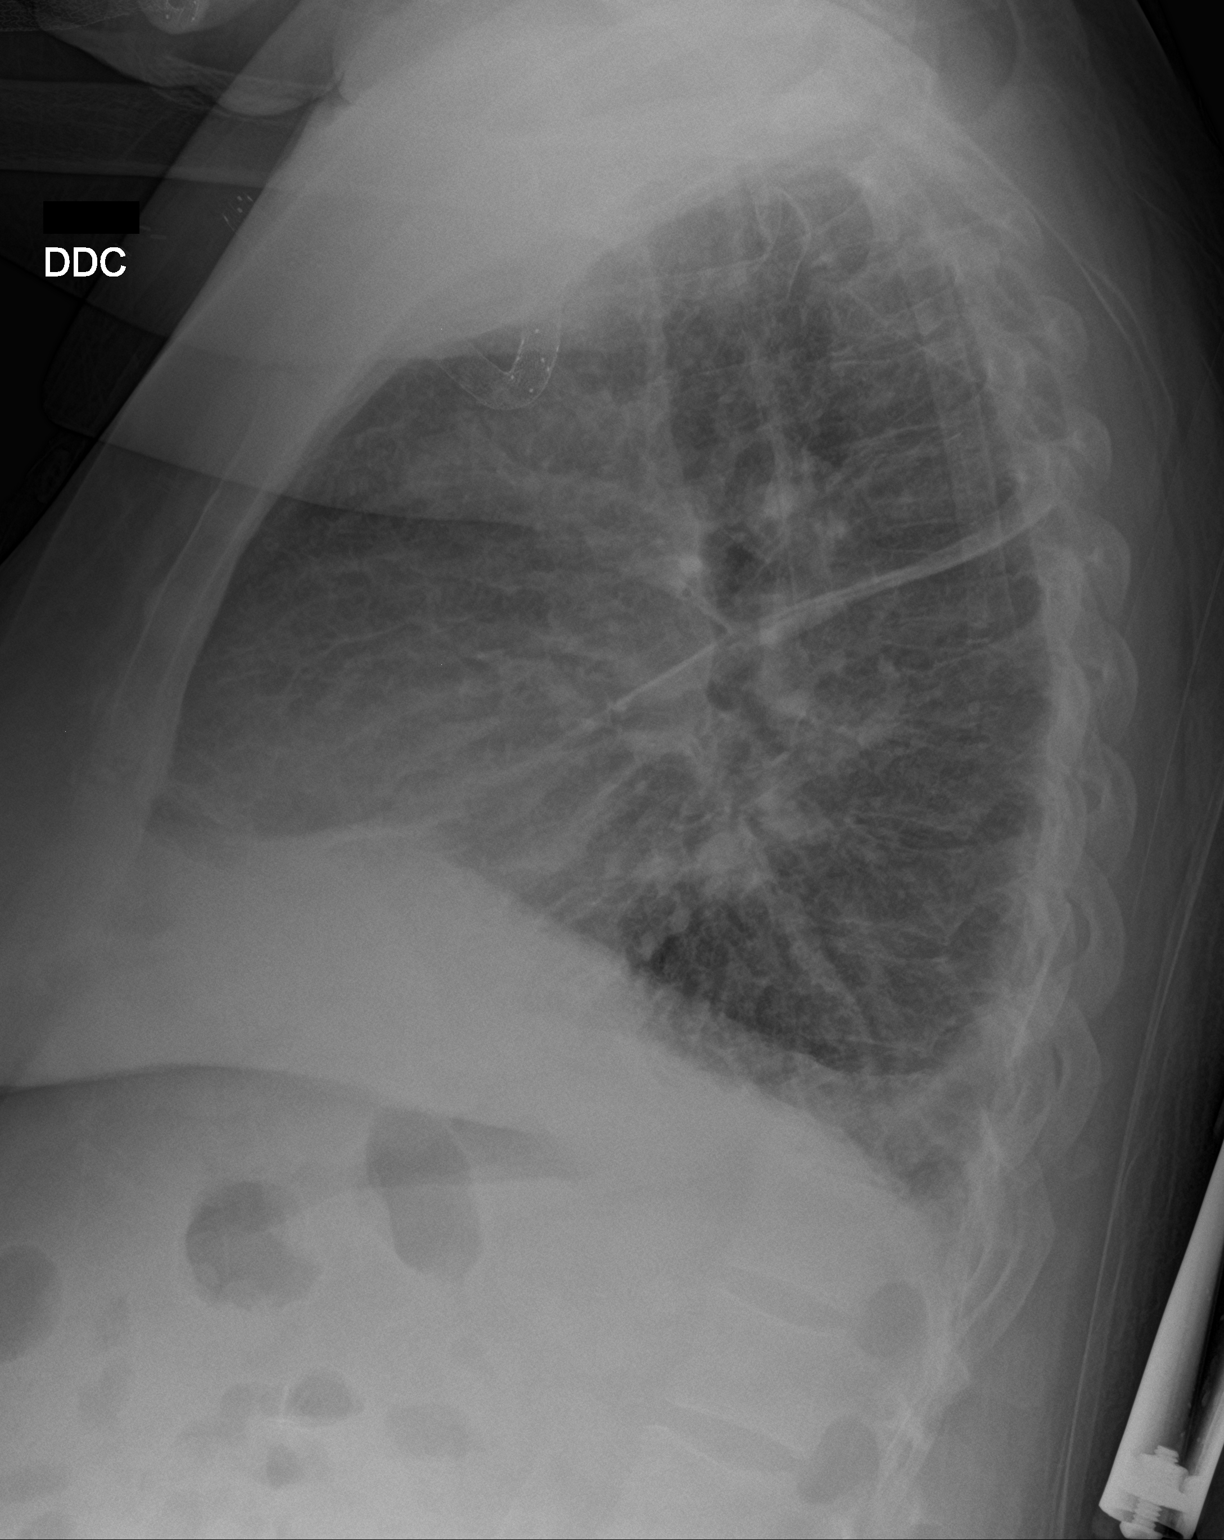
[im 2/2]
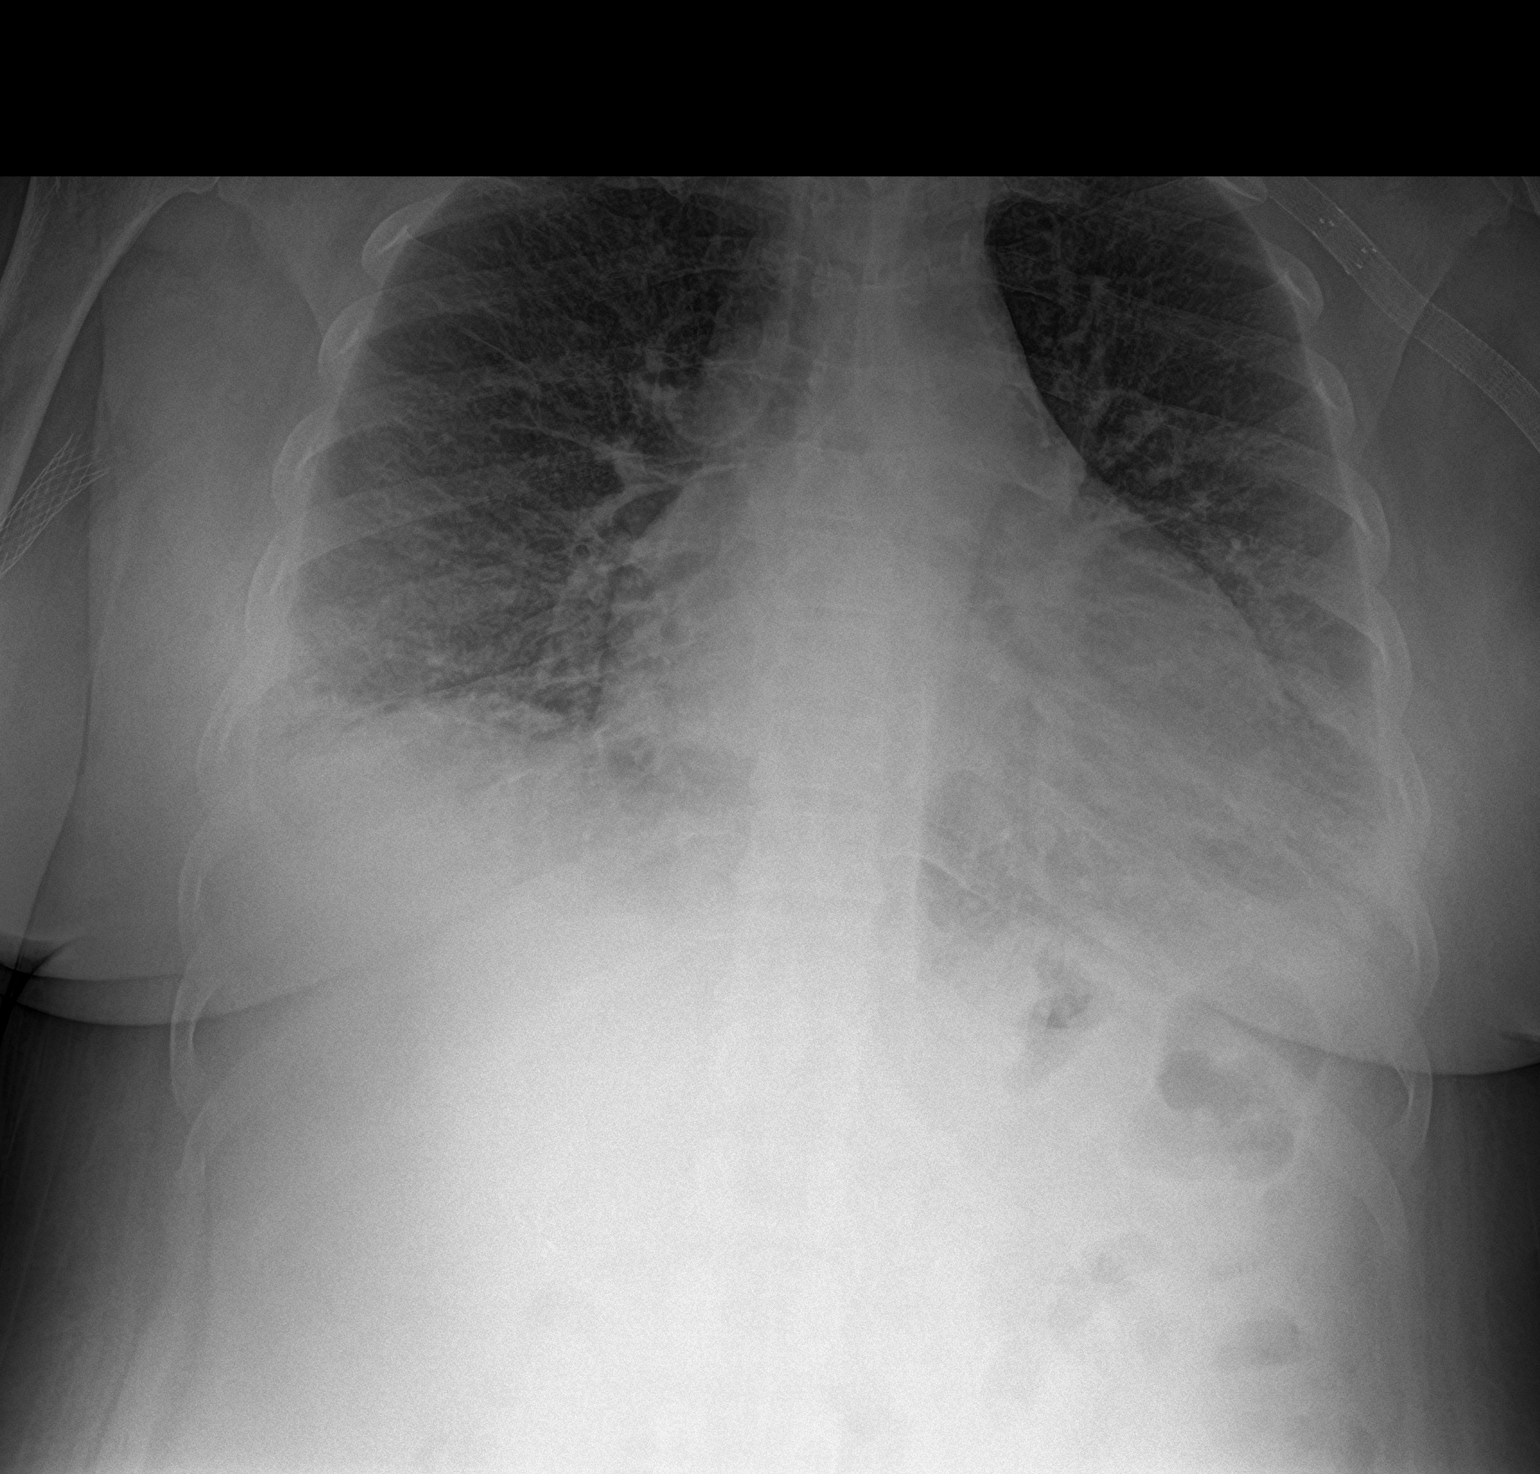

[2 of 2 positions shown; findings below may reference images not displayed]

FINDINGS: The lungs are reasonably well inflated. The interstitial markings
are increased. There small bilateral pleural effusions. The cardiac
silhouette is enlarged. The pulmonary vascularity is engorged.
Probable minimally displaced lateral left seventh rib fracture. The
bony thorax exhibits no acute abnormality otherwise.
IMPRESSION: CHF with mild interstitial edema and small bilateral pleural
effusions. No acute pneumonia.

There is a probable left lateral seventh rib fracture.

## 2017-09-10 ENCOUNTER — Ambulatory Visit: Payer: Medicare Other | Admitting: Oncology

## 2017-09-10 ENCOUNTER — Other Ambulatory Visit: Payer: Medicare Other

## 2017-09-10 IMAGING — DX DG CHEST 1V PORT
1 series · 1 of 1 positions shown · non-contrast
Comparison: 05/04/2017.  04/01/2012.

CLINICAL DATA: Shortness of breath.

EXAM:
PORTABLE CHEST 1 VIEW

[chest ap]
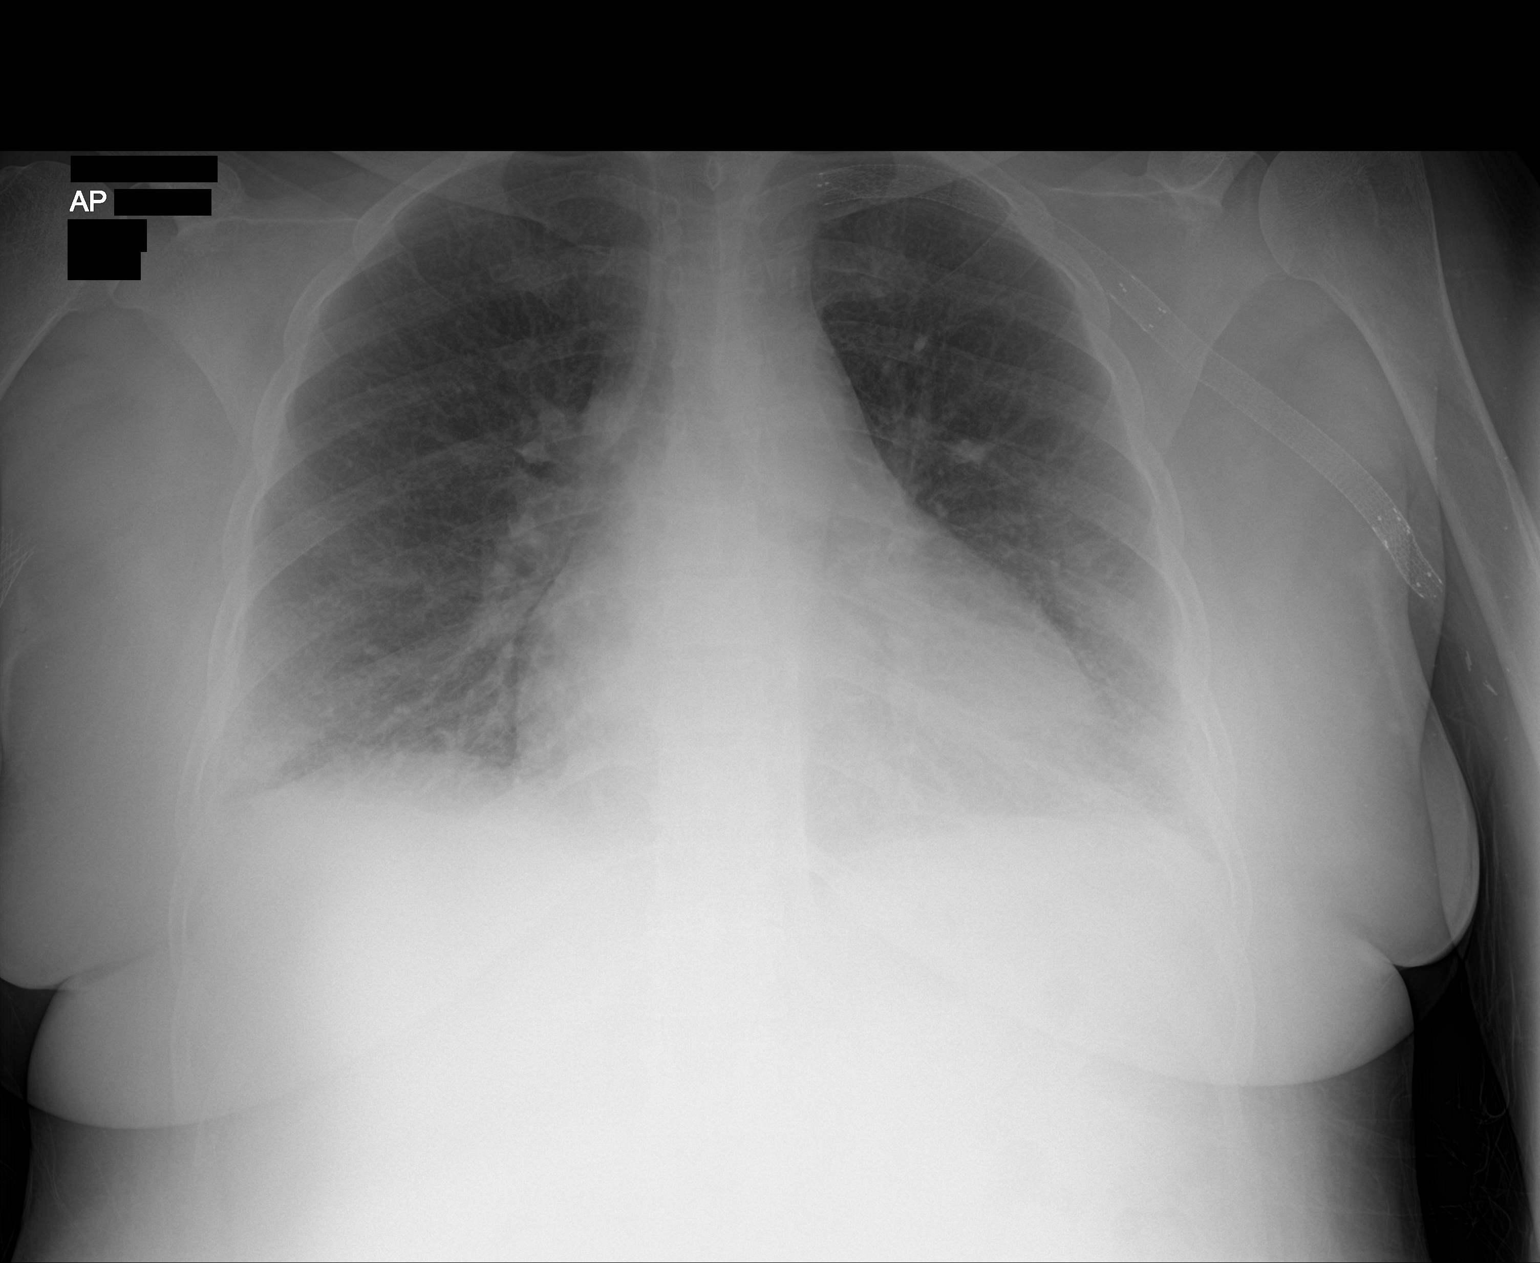

[1 of 1 positions shown; findings below may reference images not displayed]

FINDINGS: Cardiomegaly with mild bilateral interstitial prominence and small
bilateral pleural effusions consistent with CHF. Significant
improvement from prior exam. Vascular stents are noted over the left
upper extremity in stable position .
IMPRESSION: Interim significant improvement of CHF .

## 2017-09-29 ENCOUNTER — Ambulatory Visit
Admission: RE | Admit: 2017-09-29 | Discharge: 2017-09-29 | Disposition: A | Discharge status: Home / Self Care | Payer: Medicare Other | Source: Ambulatory Visit | Attending: Primary Care | Admitting: Primary Care

## 2017-09-29 ENCOUNTER — Other Ambulatory Visit: Payer: Self-pay | Admitting: Primary Care

## 2017-09-29 DIAGNOSIS — M869 Osteomyelitis, unspecified: Secondary | ICD-10-CM | POA: Insufficient documentation

## 2017-09-29 DIAGNOSIS — E11621 Type 2 diabetes mellitus with foot ulcer: Secondary | ICD-10-CM | POA: Diagnosis not present

## 2017-09-29 DIAGNOSIS — T148XXA Other injury of unspecified body region, initial encounter: Secondary | ICD-10-CM

## 2017-09-29 DIAGNOSIS — E1169 Type 2 diabetes mellitus with other specified complication: Secondary | ICD-10-CM | POA: Insufficient documentation

## 2017-09-30 ENCOUNTER — Encounter: Payer: Self-pay | Admitting: Emergency Medicine

## 2017-09-30 ENCOUNTER — Inpatient Hospital Stay
Admission: EM | Admit: 2017-09-30 | Discharge: 2017-10-06 | DRG: 617 | Disposition: A | Discharge status: Home Health | Payer: Medicare Other | Attending: Family Medicine | Admitting: Family Medicine

## 2017-09-30 ENCOUNTER — Emergency Department: Payer: Medicare Other

## 2017-09-30 ENCOUNTER — Other Ambulatory Visit: Payer: Self-pay

## 2017-09-30 DIAGNOSIS — F329 Major depressive disorder, single episode, unspecified: Secondary | ICD-10-CM | POA: Diagnosis present

## 2017-09-30 DIAGNOSIS — J449 Chronic obstructive pulmonary disease, unspecified: Secondary | ICD-10-CM | POA: Diagnosis present

## 2017-09-30 DIAGNOSIS — N186 End stage renal disease: Secondary | ICD-10-CM | POA: Diagnosis present

## 2017-09-30 DIAGNOSIS — D631 Anemia in chronic kidney disease: Secondary | ICD-10-CM | POA: Diagnosis present

## 2017-09-30 DIAGNOSIS — I5032 Chronic diastolic (congestive) heart failure: Secondary | ICD-10-CM | POA: Diagnosis present

## 2017-09-30 DIAGNOSIS — E1169 Type 2 diabetes mellitus with other specified complication: Secondary | ICD-10-CM | POA: Diagnosis present

## 2017-09-30 DIAGNOSIS — Z7902 Long term (current) use of antithrombotics/antiplatelets: Secondary | ICD-10-CM

## 2017-09-30 DIAGNOSIS — Z85528 Personal history of other malignant neoplasm of kidney: Secondary | ICD-10-CM

## 2017-09-30 DIAGNOSIS — F419 Anxiety disorder, unspecified: Secondary | ICD-10-CM | POA: Diagnosis present

## 2017-09-30 DIAGNOSIS — H9190 Unspecified hearing loss, unspecified ear: Secondary | ICD-10-CM | POA: Diagnosis present

## 2017-09-30 DIAGNOSIS — E1143 Type 2 diabetes mellitus with diabetic autonomic (poly)neuropathy: Secondary | ICD-10-CM | POA: Diagnosis present

## 2017-09-30 DIAGNOSIS — E1122 Type 2 diabetes mellitus with diabetic chronic kidney disease: Secondary | ICD-10-CM | POA: Diagnosis present

## 2017-09-30 DIAGNOSIS — R1110 Vomiting, unspecified: Secondary | ICD-10-CM

## 2017-09-30 DIAGNOSIS — Z992 Dependence on renal dialysis: Secondary | ICD-10-CM | POA: Diagnosis not present

## 2017-09-30 DIAGNOSIS — Z87891 Personal history of nicotine dependence: Secondary | ICD-10-CM

## 2017-09-30 DIAGNOSIS — I340 Nonrheumatic mitral (valve) insufficiency: Secondary | ICD-10-CM | POA: Diagnosis present

## 2017-09-30 DIAGNOSIS — N2581 Secondary hyperparathyroidism of renal origin: Secondary | ICD-10-CM | POA: Diagnosis present

## 2017-09-30 DIAGNOSIS — Z961 Presence of intraocular lens: Secondary | ICD-10-CM | POA: Diagnosis present

## 2017-09-30 DIAGNOSIS — E11319 Type 2 diabetes mellitus with unspecified diabetic retinopathy without macular edema: Secondary | ICD-10-CM | POA: Diagnosis present

## 2017-09-30 DIAGNOSIS — Z9841 Cataract extraction status, right eye: Secondary | ICD-10-CM

## 2017-09-30 DIAGNOSIS — I959 Hypotension, unspecified: Secondary | ICD-10-CM | POA: Diagnosis present

## 2017-09-30 DIAGNOSIS — M25561 Pain in right knee: Secondary | ICD-10-CM | POA: Diagnosis present

## 2017-09-30 DIAGNOSIS — M86172 Other acute osteomyelitis, left ankle and foot: Secondary | ICD-10-CM | POA: Diagnosis present

## 2017-09-30 DIAGNOSIS — Z9842 Cataract extraction status, left eye: Secondary | ICD-10-CM | POA: Diagnosis not present

## 2017-09-30 DIAGNOSIS — I252 Old myocardial infarction: Secondary | ICD-10-CM

## 2017-09-30 DIAGNOSIS — K219 Gastro-esophageal reflux disease without esophagitis: Secondary | ICD-10-CM | POA: Diagnosis present

## 2017-09-30 DIAGNOSIS — L97529 Non-pressure chronic ulcer of other part of left foot with unspecified severity: Secondary | ICD-10-CM | POA: Diagnosis present

## 2017-09-30 DIAGNOSIS — I2510 Atherosclerotic heart disease of native coronary artery without angina pectoris: Secondary | ICD-10-CM | POA: Diagnosis present

## 2017-09-30 DIAGNOSIS — E11621 Type 2 diabetes mellitus with foot ulcer: Secondary | ICD-10-CM | POA: Diagnosis present

## 2017-09-30 DIAGNOSIS — K3184 Gastroparesis: Secondary | ICD-10-CM | POA: Diagnosis present

## 2017-09-30 DIAGNOSIS — Z7982 Long term (current) use of aspirin: Secondary | ICD-10-CM

## 2017-09-30 DIAGNOSIS — M869 Osteomyelitis, unspecified: Secondary | ICD-10-CM

## 2017-09-30 DIAGNOSIS — Z79899 Other long term (current) drug therapy: Secondary | ICD-10-CM

## 2017-09-30 DIAGNOSIS — Z794 Long term (current) use of insulin: Secondary | ICD-10-CM

## 2017-09-30 DIAGNOSIS — I132 Hypertensive heart and chronic kidney disease with heart failure and with stage 5 chronic kidney disease, or end stage renal disease: Secondary | ICD-10-CM | POA: Diagnosis present

## 2017-09-30 DIAGNOSIS — E785 Hyperlipidemia, unspecified: Secondary | ICD-10-CM | POA: Diagnosis present

## 2017-09-30 LAB — CBC WITH DIFFERENTIAL/PLATELET
Basophils Absolute: 0 10*3/uL (ref 0–0.1)
Basophils Relative: 1 %
Eosinophils Absolute: 0.4 10*3/uL (ref 0–0.7)
Eosinophils Relative: 6 %
HCT: 32 % — ABNORMAL LOW (ref 35.0–47.0)
Hemoglobin: 10.4 g/dL — ABNORMAL LOW (ref 12.0–16.0)
Lymphocytes Relative: 31 %
Lymphs Abs: 1.8 10*3/uL (ref 1.0–3.6)
MCH: 30 pg (ref 26.0–34.0)
MCHC: 32.4 g/dL (ref 32.0–36.0)
MCV: 92.4 fL (ref 80.0–100.0)
Monocytes Absolute: 0.8 10*3/uL (ref 0.2–0.9)
Monocytes Relative: 13 %
Neutro Abs: 2.8 10*3/uL (ref 1.4–6.5)
Neutrophils Relative %: 49 %
Platelets: 154 10*3/uL (ref 150–440)
RBC: 3.46 MIL/uL — ABNORMAL LOW (ref 3.80–5.20)
RDW: 14.6 % — ABNORMAL HIGH (ref 11.5–14.5)
WBC: 5.8 10*3/uL (ref 3.6–11.0)

## 2017-09-30 LAB — BASIC METABOLIC PANEL
Anion gap: 10 (ref 5–15)
BUN: 8 mg/dL (ref 6–20)
CO2: 28 mmol/L (ref 22–32)
Calcium: 7.8 mg/dL — ABNORMAL LOW (ref 8.9–10.3)
Chloride: 95 mmol/L — ABNORMAL LOW (ref 101–111)
Creatinine, Ser: 2.91 mg/dL — ABNORMAL HIGH (ref 0.44–1.00)
GFR calc Af Amer: 19 mL/min — ABNORMAL LOW (ref >60)
GFR calc non Af Amer: 17 mL/min — ABNORMAL LOW (ref >60)
Glucose, Bld: 191 mg/dL — ABNORMAL HIGH (ref 65–99)
Potassium: 3.9 mmol/L (ref 3.5–5.1)
Sodium: 133 mmol/L — ABNORMAL LOW (ref 135–145)

## 2017-09-30 LAB — GLUCOSE, CAPILLARY: Glucose-Capillary: 101 mg/dL — ABNORMAL HIGH (ref 65–99)

## 2017-09-30 LAB — PROTIME-INR
INR: 0.93
Prothrombin Time: 12.4 seconds (ref 11.4–15.2)

## 2017-09-30 LAB — MRSA PCR SCREENING: MRSA by PCR: NEGATIVE

## 2017-09-30 MED ORDER — VANCOMYCIN HCL IN DEXTROSE 1-5 GM/200ML-% IV SOLN: 1000.0000 mg | Freq: Once | INTRAVENOUS | Status: DC

## 2017-09-30 MED ORDER — PIPERACILLIN-TAZOBACTAM 3.375 G IVPB
3.3750 g | Freq: Two times a day (BID) | INTRAVENOUS | Status: DC
  Administered 2017-10-01 – 2017-10-03 (×5): 3.375 g via INTRAVENOUS
  Filled 2017-09-30 (×5): qty 50

## 2017-09-30 MED ORDER — VANCOMYCIN HCL 10 G IV SOLR
2000.0000 mg | Freq: Once | INTRAVENOUS | Status: DC
  Filled 2017-09-30: qty 2000

## 2017-09-30 MED ORDER — OXYCODONE-ACETAMINOPHEN 5-325 MG PO TABS
1.0000 | ORAL_TABLET | ORAL | Status: DC | PRN
  Administered 2017-09-30 – 2017-10-02 (×4): 1 via ORAL
  Filled 2017-09-30 (×4): qty 1

## 2017-09-30 MED ORDER — ALBUTEROL SULFATE (2.5 MG/3ML) 0.083% IN NEBU: 2.5000 mg | INHALATION_SOLUTION | Freq: Four times a day (QID) | RESPIRATORY_TRACT | Status: DC | PRN

## 2017-09-30 MED ORDER — CITALOPRAM HYDROBROMIDE 20 MG PO TABS: 20.0000 mg | ORAL_TABLET | Freq: Every day | ORAL | Status: DC

## 2017-09-30 MED ORDER — DOCUSATE SODIUM 100 MG PO CAPS: 100.0000 mg | ORAL_CAPSULE | Freq: Two times a day (BID) | ORAL | Status: DC | PRN

## 2017-09-30 MED ORDER — ASPIRIN EC 81 MG PO TBEC
81.0000 mg | DELAYED_RELEASE_TABLET | Freq: Every day | ORAL | Status: DC
  Administered 2017-10-01 – 2017-10-06 (×5): 81 mg via ORAL
  Filled 2017-09-30 (×5): qty 1

## 2017-09-30 MED ORDER — ALUM & MAG HYDROXIDE-SIMETH 200-200-20 MG/5ML PO SUSP
5.0000 mL | Freq: Four times a day (QID) | ORAL | Status: DC | PRN
  Administered 2017-10-06 (×2): 5 mL via ORAL
  Filled 2017-09-30 (×2): qty 30

## 2017-09-30 MED ORDER — VITAMIN B-12 1000 MCG PO TABS
1000.0000 ug | ORAL_TABLET | Freq: Every day | ORAL | Status: DC
  Administered 2017-10-01 – 2017-10-06 (×5): 1000 ug via ORAL
  Filled 2017-09-30 (×5): qty 1

## 2017-09-30 MED ORDER — ATORVASTATIN CALCIUM 20 MG PO TABS
20.0000 mg | ORAL_TABLET | Freq: Every day | ORAL | Status: DC
  Administered 2017-09-30 – 2017-10-05 (×6): 20 mg via ORAL
  Filled 2017-09-30 (×6): qty 1

## 2017-09-30 MED ORDER — FAMOTIDINE 20 MG PO TABS
20.0000 mg | ORAL_TABLET | Freq: Two times a day (BID) | ORAL | Status: DC
  Administered 2017-09-30 – 2017-10-06 (×11): 20 mg via ORAL
  Filled 2017-09-30 (×10): qty 1

## 2017-09-30 MED ORDER — PIPERACILLIN-TAZOBACTAM 3.375 G IVPB 30 MIN
3.3750 g | Freq: Once | INTRAVENOUS | Status: AC
  Administered 2017-09-30: 3.375 g via INTRAVENOUS
  Filled 2017-09-30: qty 50

## 2017-09-30 MED ORDER — VANCOMYCIN HCL 10 G IV SOLR
2000.0000 mg | Freq: Once | INTRAVENOUS | Status: AC
  Administered 2017-09-30: 2000 mg via INTRAVENOUS
  Filled 2017-09-30: qty 2000

## 2017-09-30 MED ORDER — CHOLECALCIFEROL 10 MCG (400 UNIT) PO TABS
400.0000 [IU] | ORAL_TABLET | Freq: Every day | ORAL | Status: DC
  Administered 2017-10-01 – 2017-10-05 (×5): 400 [IU] via ORAL
  Filled 2017-09-30 (×6): qty 1

## 2017-09-30 MED ORDER — VANCOMYCIN HCL IN DEXTROSE 1-5 GM/200ML-% IV SOLN
1000.0000 mg | INTRAVENOUS | Status: DC
  Administered 2017-10-02: 1000 mg via INTRAVENOUS
  Filled 2017-09-30 (×2): qty 200

## 2017-09-30 MED ORDER — HEPARIN SODIUM (PORCINE) 5000 UNIT/ML IJ SOLN
5000.0000 [IU] | Freq: Three times a day (TID) | INTRAMUSCULAR | Status: DC
  Administered 2017-09-30 – 2017-10-06 (×15): 5000 [IU] via SUBCUTANEOUS
  Filled 2017-09-30 (×15): qty 1

## 2017-09-30 MED ORDER — FLUOXETINE HCL 20 MG PO CAPS
20.0000 mg | ORAL_CAPSULE | Freq: Every day | ORAL | Status: DC
  Administered 2017-09-30 – 2017-10-05 (×6): 20 mg via ORAL
  Filled 2017-09-30 (×7): qty 1

## 2017-09-30 MED ORDER — DIPHENHYDRAMINE HCL 25 MG PO CAPS: 25.0000 mg | ORAL_CAPSULE | Freq: Four times a day (QID) | ORAL | Status: DC | PRN

## 2017-09-30 MED ORDER — INSULIN ASPART 100 UNIT/ML ~~LOC~~ SOLN
0.0000 [IU] | Freq: Three times a day (TID) | SUBCUTANEOUS | Status: DC
  Administered 2017-09-30: 1 [IU] via SUBCUTANEOUS
  Administered 2017-10-01 – 2017-10-03 (×3): 2 [IU] via SUBCUTANEOUS
  Administered 2017-10-04 – 2017-10-06 (×4): 1 [IU] via SUBCUTANEOUS
  Filled 2017-09-30 (×9): qty 1

## 2017-09-30 NOTE — ED Provider Notes (Signed)
[facility] Emergency Department Provider Note  ____________________________________________  Time seen: Approximately 3:33 PM  I have reviewed the triage vital signs and the nursing notes.   HISTORY  Chief Complaint Osteomyelitis     HPI [name] is a 57 y.o. female sent to the ED by her PCP due to infection of the left great toe. Patient sees podiatry Dr. Caryl Comes, last saw him about 3 weeks ago for routine debridement. She reports that 3 days ago, she started developing pain and ulceration and swelling in the left great toe. Her primary care doctor [facility] for an x-ray which is concerning for an infection in the bone. She's had continuous worsening pain gradual onset in the left great toe, worse with movement or weightbearing. Associated with swelling of the left lower leg. No alleviating factors. Moderate intensity and aching. No fevers chills or sweats.  Patient has been on ciprofloxacin and clindamycin orally from her primary care doctor without improvement of her symptoms.  She also reports pain in the right knee after a recent mechanical fall. She is able to bear weight on it although it hurts more when she does.  She is on dialysis, and went to her usual dialysis session today without any difficulty..     Past Medical History:  Diagnosis Date  . Anemia   . Anginal pain ([place])   . Anxiety   . Arthritis   . Asthma   . Broken wrist   . Bronchitis   . chronic diastolic CHF 11/17/3333  . COPD (chronic obstructive pulmonary disease) ([place])   . Coronary artery disease    a. cath 2013: stenting to RCA (report not available); b. cath 2014: LM nl, pLAD 40%, mLAD nl, ost LCx 40%, mid LCx nl, pRCA 30% @ site of prior stent, mRCA 50%  . Depression   . Diabetes mellitus without complication ([place])   . Diabetic neuropathy ([place])   . dialysis 2006  . Diverticulosis   . Dizziness   . Dyspnea   . Elevated lipids   . Environmental and seasonal allergies   .  ESRD (end stage renal disease) on dialysis ([place])    M-W-F  . GERD (gastroesophageal reflux disease)   . Headache   . History of hiatal hernia   . HOH (hard of hearing)   . Hx of pancreatitis 2015  . Hypertension   . Lower extremity edema   . Mitral regurgitation    a. echo 10/2013: EF 62%, noWMA, mildly dilated LA, mild to mod MR/TR, GR1DD  . Myocardial infarction ([place])   . Orthopnea   . Pneumonia   . Renal cancer ([place])   . Renal insufficiency    Pt is on dialysis on M,W + F.  . Wheezing      Patient Active Problem List   Diagnosis Date Noted  . Carotid stenosis 06/18/2017  . Shortness of breath 05/04/2017  . Cellulitis of lower extremity 07/29/2016  . Chronic venous insufficiency 07/29/2016  . Lymphedema 07/29/2016  . TIA (transient ischemic attack) 04/21/2016  . Altered mental status 04/08/2016  . Hyperammonemia ([place]) 04/08/2016  . Elevated troponin 04/08/2016  . Depression 04/08/2016  . Depression, major, recurrent, severe with psychosis ([place]) 04/08/2016  . Blood in stool   . Intractable cyclical vomiting with nausea   . Reflux esophagitis   . Gastritis   . Generalized abdominal pain   . Uncontrollable vomiting   . Major depressive disorder, recurrent episode, moderate ([place]) 03/15/2016  . Adjustment disorder  with mixed anxiety and depressed mood 03/15/2016  . Malnutrition of moderate degree 12/01/2015  . Renal mass   . Dyspnea   . Acute renal failure ([place])   . Respiratory failure ([place])   . High temperature 11/14/2015  . Pulmonary edema   . Encounter for central line placement   . Encounter for orogastric (OG) tube placement   . Nausea 11/12/2015  . Hyperkalemia 10/03/2015  . Diarrhea, unspecified 07/22/2015  . Pneumonia 05/21/2015  . Hypoglycemia 04/24/2015  . Unresponsiveness 04/24/2015  . Bradycardia 04/24/2015  . Hypothermia 04/24/2015  . Acute respiratory failure ([place]) 04/24/2015  . Acute diastolic CHF (congestive heart failure) ([place]) 04/05/2015  .  Diabetic gastroparesis ([place]) 04/05/2015  . Hypokalemia 04/05/2015  . Generalized weakness 04/05/2015  . Acute pulmonary edema ([place]) 04/03/2015  . Nausea and vomiting 04/03/2015  . Hypoglycemia associated with diabetes ([place]) 04/03/2015  . Anemia of chronic disease 04/03/2015  . Secondary hyperparathyroidism ([place]) 04/03/2015  . Pressure ulcer 04/02/2015  . Acute respiratory failure with hypoxia ([place]) 04/01/2015  . Adjustment disorder with anxiety 03/14/2015  . Somatic symptom disorder, mild 03/08/2015  . Coronary artery disease involving native coronary artery of native heart without angina pectoris   . Nausea & vomiting 03/06/2015  . Abdominal pain 03/06/2015  . DM (diabetes mellitus) ([place]) 03/06/2015  . HTN (hypertension) 03/06/2015  . Gastroparesis 02/24/2015  . Pleural effusion 02/19/2015  . HCAP (healthcare-associated pneumonia) 02/19/2015  . End-stage renal disease on hemodialysis ([place]) 02/19/2015     Past Surgical History:  Procedure Laterality Date  . ABDOMINAL HYSTERECTOMY  1992  . APPENDECTOMY    . CARDIAC CATHETERIZATION Left 07/26/2015   Procedure: Left Heart Cath and Coronary Angiography;  Surgeon: Dionisio David, MD;  Location: [place] CV LAB;  Service: Cardiovascular;  Laterality: Left;  . CATARACT EXTRACTION W/ INTRAOCULAR LENS IMPLANT Right   . CATARACT EXTRACTION W/PHACO Left 03/10/2017   Procedure: CATARACT EXTRACTION PHACO AND INTRAOCULAR LENS PLACEMENT (IOC);  Surgeon: Birder Robson, MD;  Location: [facility] ORS;  Service: Ophthalmology;  Laterality: Left;  [place] 00:51.9 AP% 14.2 CDE 7.39 fluid pack lot # [id number]  . CHOLECYSTECTOMY    . COLONOSCOPY WITH PROPOFOL N/A 08/12/2016   Procedure: COLONOSCOPY WITH PROPOFOL;  Surgeon: Lollie Sails, MD;  Location: [facility] ENDOSCOPY;  Service: Endoscopy;  Laterality: N/A;  . DIALYSIS FISTULA CREATION Left    upper arm  . dialysis grafts    . ESOPHAGOGASTRODUODENOSCOPY N/A 03/08/2015   Procedure:  ESOPHAGOGASTRODUODENOSCOPY (EGD);  Surgeon: Manya Silvas, MD;  Location: [facility] ENDOSCOPY;  Service: Endoscopy;  Laterality: N/A;  . ESOPHAGOGASTRODUODENOSCOPY (EGD) WITH PROPOFOL N/A 03/18/2016   Procedure: ESOPHAGOGASTRODUODENOSCOPY (EGD) WITH PROPOFOL;  Surgeon: [name], MD;  Location: [facility] ENDOSCOPY;  Service: Endoscopy;  Laterality: N/A;  . EYE SURGERY Right 2018  . FECAL TRANSPLANT N/A 08/23/2015   Procedure: FECAL TRANSPLANT;  Surgeon: Manya Silvas, MD;  Location: [facility] ENDOSCOPY;  Service: Endoscopy;  Laterality: N/A;  . HAND SURGERY Bilateral   . PERIPHERAL VASCULAR CATHETERIZATION N/A 12/20/2015   Procedure: Thrombectomy of dialysis access versus permcath placement;  Surgeon: Algernon Huxley, MD;  Location: [place] CV LAB;  Service: Cardiovascular;  Laterality: N/A;  . PERIPHERAL VASCULAR CATHETERIZATION N/A 12/20/2015   Procedure: A/V Shunt Intervention;  Surgeon: Algernon Huxley, MD;  Location: [place] CV LAB;  Service: Cardiovascular;  Laterality: N/A;  . PERIPHERAL VASCULAR CATHETERIZATION N/A 12/20/2015   Procedure: A/V Shuntogram/Fistulagram;  Surgeon: Algernon Huxley, MD;  Location: [place] INVASIVE CV  LAB;  Service: Cardiovascular;  Laterality: N/A;  . PERIPHERAL VASCULAR CATHETERIZATION N/A 01/02/2016   Procedure: A/V Shuntogram/Fistulagram;  Surgeon: Algernon Huxley, MD;  Location: [place] CV LAB;  Service: Cardiovascular;  Laterality: N/A;  . PERIPHERAL VASCULAR CATHETERIZATION N/A 01/02/2016   Procedure: A/V Shunt Intervention;  Surgeon: Algernon Huxley, MD;  Location: [place] CV LAB;  Service: Cardiovascular;  Laterality: N/A;     Prior to Admission medications   Medication Sig Start Date End Date Taking? Authorizing Provider  acidophilus (RISAQUAD) CAPS capsule Take 1 capsule by mouth daily with lunch.     [provider]  albuterol (PROVENTIL HFA;VENTOLIN HFA) 108 (90 Base) MCG/ACT inhaler Inhale 2 puffs into the lungs every 6 (six) hours as needed for  shortness of breath.     [provider]  albuterol (PROVENTIL) (2.5 MG/3ML) 0.083% nebulizer solution Take 2.5 mg by nebulization every 6 (six) hours as needed for wheezing or shortness of breath.    [provider]  alum & mag hydroxide-simeth (MAALOX MAX) 400-400-40 MG/5ML suspension Take 5 mLs by mouth every 6 (six) hours as needed for indigestion. 06/09/17   Rudene Re, MD  amLODipine (NORVASC) 10 MG tablet Take 10 mg by mouth daily.    [provider]  aspirin EC 81 MG tablet Take 81 mg by mouth daily. 09/08/16   [provider]  atorvastatin (LIPITOR) 40 MG tablet Take 20 mg by mouth at bedtime.  12/02/16   [provider]  cetirizine (ZYRTEC) 10 MG tablet Take 10 mg by mouth daily.     [provider]  clopidogrel (PLAVIX) 75 MG tablet Take 1 tablet (75 mg total) by mouth daily. 03/25/17 03/25/18  Dustin Flock, MD  cyanocobalamin 1000 MCG tablet Take 1,000 mcg by mouth daily with lunch.     [provider]  diclofenac sodium (VOLTAREN) 1 % GEL Apply 2 g topically 2 (two) times daily as needed (for leg pain).     [provider]  diphenhydrAMINE (BENADRYL) 25 mg capsule Take 25 mg by mouth every 6 (six) hours as needed for itching.    [provider]  famotidine (PEPCID) 20 MG tablet Take 1 tablet (20 mg total) by mouth 2 (two) times daily. 06/09/17 06/09/18  Rudene Re, MD  FLUoxetine (PROZAC) 20 MG capsule Take 1 capsule (20 mg total) by mouth daily. Patient taking differently: Take 20 mg by mouth at bedtime.  04/10/16   Hower, Aaron Mose, MD  glucose 4 GM chewable tablet Chew 3-4 tablets by mouth as needed for low blood sugar (blood sugar less than 60).     [provider]  hydrALAZINE (APRESOLINE) 25 MG tablet Take 25 mg by mouth 3 (three) times daily.    [provider]  HYDROcodone-acetaminophen (NORCO/VICODIN) 5-325 MG tablet Take 1 tablet by mouth every 6 (six) hours as needed for  moderate pain or severe pain. 05/05/17   Nicholes Mango, MD  hydrOXYzine (ATARAX/VISTARIL) 25 MG tablet Take 25 mg by mouth daily.    [provider]  insulin detemir (LEVEMIR) 100 UNIT/ML injection Inject 0.02 mLs (2 Units total) into the skin 2 (two) times daily. 09/05/17   Epifanio Lesches, MD  lipase/protease/amylase (CREON) 12000 UNITS CPEP capsule Take 24,000-36,000 Units by mouth 3 (three) times daily with meals. Take 36000 mg in the morning, 24000 midday & 36000 in the evening    [provider]  LORazepam (ATIVAN) 1 MG tablet Take 1 tablet (1 mg total) by  mouth 2 (two) times daily. 06/08/17 06/08/18  Earleen Newport, MD  meclizine (ANTIVERT) 25 MG tablet 25 mg    [provider]  metoCLOPramide (REGLAN) 10 MG tablet Take 1 tablet (10 mg total) by mouth every 8 (eight) hours as needed for nausea or vomiting. 06/08/17   Earleen Newport, MD  multivitamin (RENA-VIT) TABS tablet Take 1 tablet by mouth daily.     [provider]  nitroGLYCERIN (NITROSTAT) 0.4 MG SL tablet Place 0.4 mg under the tongue every 5 (five) minutes x 3 doses as needed.    [provider]  NOVOLOG FLEXPEN 100 UNIT/ML FlexPen Inject 4-12 Units into the skin 3 (three) times daily before meals. Per sliding scale 12/03/16   [provider]  OLANZapine (ZYPREXA) 5 MG tablet Take 0.5 tablets (2.5 mg total) by mouth at bedtime. Patient taking differently: Take 5 mg by mouth at bedtime.  04/29/16   Loletha Grayer, MD  omega-3 acid ethyl esters (LOVAZA) 1 g capsule Take 2 g by mouth 2 (two) times daily.     [provider]  pantoprazole (PROTONIX) 40 MG tablet Take 1 tablet (40 mg total) by mouth 2 (two) times daily before a meal. 03/19/16   Gladstone Lighter, MD  pregabalin (LYRICA) 25 MG capsule Take 25 mg by mouth daily.    [provider]  promethazine (PHENERGAN) 25 MG tablet Take 12.5 mg by mouth every 6 (six) hours as needed for nausea or vomiting.     [provider]  ranitidine (ZANTAC) 150 MG tablet Take 150 mg by mouth 2 (two) times daily. In the afternoon    [provider]  rifaximin (XIFAXAN) 550 MG TABS tablet Take 550 mg by mouth 2 (two) times daily.    [provider]  sevelamer carbonate (RENVELA) 800 MG tablet Take 800 mg by mouth 3 (three) times daily with meals.     [provider]     Allergies Ace inhibitors; Ativan [lorazepam]; Compazine [prochlorperazine edisylate]; Sumatriptan succinate; Dilaudid [hydromorphone hcl]; Ondansetron; Zofran [ondansetron hcl]; Codeine; Gabapentin; Lac bovis; Losartan; Oxycodone; Prochlorperazine; Reglan [metoclopramide]; Scopolamine; Tape; and Tapentadol   Family History  Problem Relation Age of Onset  . Kidney disease Mother   . Diabetes Mother   . Cancer Father   . Kidney disease Sister     Social History Social History   Tobacco Use  . Smoking status: Former Smoker    Packs/day: 0.50    Years: 40.00    Pack years: 20.00    Types: Cigarettes    Last attempt to quit: 02/13/2015    Years since quitting: 2.6  . Smokeless tobacco: Never Used  Substance Use Topics  . Alcohol use: Yes    Alcohol/week: 0.6 oz    Types: 1 Glasses of wine per week    Comment: glass wine week per pt  . Drug use: No    Review of Systems  Constitutional:   No fever or chills.  ENT:   No sore throat. No rhinorrhea. Cardiovascular:   No chest pain or syncope. Respiratory:   No dyspnea or cough. Gastrointestinal:   Negative for abdominal pain, vomiting and diarrhea.  Musculoskeletal:   Right knee pain as above. Left toe pain and left leg swelling as above All other systems reviewed and are negative except as documented above in ROS and HPI.  ____________________________________________   PHYSICAL EXAM:  VITAL SIGNS: ED Triage Vitals  Enc Vitals Group     BP 09/30/17 1453 Marland Kitchen)  111/46     Pulse Rate 09/30/17 1453 65     Resp 09/30/17 1453 18     Temp  09/30/17 1453 97.9 F (36.6 C)     Temp Source 09/30/17 1453 Oral     SpO2 09/30/17 1453 99 %     Weight 09/30/17 1453 184 lb 6.4 oz (83.6 kg)     Height 09/30/17 1453 5' 5.5" (1.664 m)     Head Circumference --      Peak Flow --      Pain Score 09/30/17 1452 9     Pain Loc --      Pain Edu? --      Excl. in [place]? --     Vital signs reviewed, nursing assessments reviewed.   Constitutional:   Alert and oriented. Well appearing and in no distress. Eyes:   No scleral icterus.  EOMI. No nystagmus. No conjunctival pallor. PERRL. ENT   Head:   Normocephalic and atraumatic.   Nose:   No congestion/rhinnorhea.    Mouth/Throat:   MMM, no pharyngeal erythema. No peritonsillar mass.    Neck:   No meningismus. Full ROM. Hematological/Lymphatic/Immunilogical:   No cervical lymphadenopathy. Cardiovascular:   RRR. Symmetric bilateral radial and DP pulses.  No murmurs.  Respiratory:   Normal respiratory effort without tachypnea/retractions. Breath sounds are clear and equal bilaterally. No wheezes/rales/rhonchi. Gastrointestinal:   Soft and nontender. Non distended. There is no CVA tenderness.  No rebound, rigidity, or guarding. Genitourinary:   deferred Musculoskeletal:   Normal range of motion in all extremities. No joint effusions.  There is diffuse tenderness about the right knee without bony point tenderness or instability. The left lower leg is edematous compared to the right with relative erythema and warmth as well below the mid shin area all the way down to the toe. The left great toe has a macerated ulceration at the distal aspect. The skin is thickened, and there appears to be some purulent fluid within the ulceration. It is tender to the touch on the left great toe. No exposed bone. Toenails are well groomed. Neurologic:   Normal speech and language.  Motor grossly intact. No gross focal neurologic deficits are appreciated.  Skin:    Skin is warm, dry with left lower leg  findings as above. No petechiae purpura or bullae. .  ____________________________________________    LABS (pertinent positives/negatives) (all labs ordered are listed, but only abnormal results are displayed) Labs Reviewed  AEROBIC CULTURE (SUPERFICIAL SPECIMEN)  BASIC METABOLIC PANEL  CBC WITH DIFFERENTIAL/PLATELET  PROTIME-INR   ____________________________________________   EKG    ____________________________________________    RADIOLOGY  Dg Knee Complete 4 Views Right  Result Date: 09/30/2017 CLINICAL DATA:  Fall, injury to right knee EXAM: RIGHT KNEE - COMPLETE 4+ VIEW COMPARISON:  01/06/2012 FINDINGS: No acute bony abnormality. Specifically, no fracture, subluxation, or dislocation. No joint effusion. Joint spaces maintained. Vascular calcifications noted. IMPRESSION: No acute bony abnormality. Electronically Signed   By: Rolm Baptise M.D.   On: 09/30/2017 15:27    ____________________________________________   PROCEDURES Procedures  ____________________________________________    CLINICAL IMPRESSION / ASSESSMENT AND PLAN / ED COURSE  Pertinent labs & imaging results that were available during my care of the patient were reviewed by me and considered in my medical decision making (see chart for details).     Clinical Course as of Oct 01 1531  Wed Sep 30, 2017  1509 P/w unstageable toe ulcer, xray c/w osteomyelitis. Will start iv vanc +  zosyn, plan to admit for surgical eval. Not septic.  [PS]    Clinical Course User Index [PS] Carrie Mew, MD     ----------------------------------------- 3:38 PM on 09/30/2017 -----------------------------------------  Case discussed with the hospitalists for further management. Outpatient toe x-ray report reviewed by me, consistent with osteomyelitis.. Knee x-ray imaging report reviewed by me, no evidence of acute fracture.  ____________________________________________   FINAL CLINICAL IMPRESSION(S) / ED  DIAGNOSES    Final diagnoses:  Acute osteomyelitis of toe of left foot (HCC)  End-stage renal disease on hemodialysis ([place])  Acute pain of right knee       Portions of this note were generated with dragon dictation software. Dictation errors may occur despite best attempts at proofreading.    Carrie Mew, MD 09/30/17 1539

## 2017-09-30 NOTE — Progress Notes (Signed)
ANTIBIOTIC CONSULT NOTE - INITIAL  Pharmacy Consult for Vancomycin and Zosyn Indication: osteomyelitis   Allergies  Allergen Reactions  . Ace Inhibitors Swelling and Anaphylaxis  . Ativan [Lorazepam] Other (See Comments)    Reaction:Hallucinations and headaches Reaction:Hallucinations and headaches Reaction:  Hallucinations and headaches  . Compazine [Prochlorperazine Edisylate] Anaphylaxis and Nausea And Vomiting    Other reaction(s): dystonia from this vs. reglan 23 Jul - patient relates that she takes promethazine frequently with no problems. 23 Jul - patient relates that she takes promethazine frequently with no problems.  . Sumatriptan Succinate     Other reaction(s): delirium and hallucinations per [facility] records  . Dilaudid [Hydromorphone Hcl] Other (See Comments)    Delirium   . Ondansetron Other (See Comments)    hallucinations    . Zofran [Ondansetron Hcl] Other (See Comments)    Reaction:  hallucinations  Reaction:  hallucinations   . Codeine Nausea And Vomiting  . Gabapentin Other (See Comments)    Reaction:  Unknown    . Lac Bovis Nausea And Vomiting  . Losartan Nausea Only  . Oxycodone Anxiety  . Prochlorperazine Other (See Comments)    Reaction:  Unknown . Patient does not remember reaction but she does have vertigo and anxiety along with n and v at times. Could be used to treat any of these   . Reglan [Metoclopramide]     Per patient her Dr. Evelina Bucy her off it  Per patient her Dr. Evelina Bucy her off it   . Scopolamine Other (See Comments)    Dizziness, also has vertigo already  . Tape Rash  . Tapentadol Rash    Patient Measurements: Height: 5' 5.5" (166.4 cm) Weight: 184 lb 6.4 oz (83.6 kg) IBW/kg (Calculated) : 58.15 Adjusted Body Weight:   Vital Signs: Temp: 97.9 F (36.6 C) (01/09 1453) Temp Source: Oral (01/09 1453) BP: 111/46 (01/09 1453) Pulse Rate: 65 (01/09 1453) Intake/Output from previous day: No intake/output data recorded. Intake/Output  from this shift: No intake/output data recorded.  Labs: Recent Labs    09/30/17 1555  WBC 5.8  HGB 10.4*  PLT 154  CREATININE 2.91*   Estimated Creatinine Clearance: 22.8 mL/min (A) (by C-G formula based on SCr of 2.91 mg/dL (H)). No results for input(s): VANCOTROUGH, VANCOPEAK, VANCORANDOM, GENTTROUGH, GENTPEAK, GENTRANDOM, TOBRATROUGH, TOBRAPEAK, TOBRARND, AMIKACINPEAK, AMIKACINTROU, AMIKACIN in the last 72 hours.   Microbiology: No results found for this or any previous visit (from the past 720 hour(s)).  Medical History: Past Medical History:  Diagnosis Date  . Anemia   . Anginal pain ([place])   . Anxiety   . Arthritis   . Asthma   . Broken wrist   . Bronchitis   . chronic diastolic CHF 8/50/2774  . COPD (chronic obstructive pulmonary disease) ([place])   . Coronary artery disease    a. cath 2013: stenting to RCA (report not available); b. cath 2014: LM nl, pLAD 40%, mLAD nl, ost LCx 40%, mid LCx nl, pRCA 30% @ site of prior stent, mRCA 50%  . Depression   . Diabetes mellitus without complication ([place])   . Diabetic neuropathy ([place])   . dialysis 2006  . Diverticulosis   . Dizziness   . Dyspnea   . Elevated lipids   . Environmental and seasonal allergies   . ESRD (end stage renal disease) on dialysis ([place])    M-W-F  . GERD (gastroesophageal reflux disease)   . Headache   . History of hiatal hernia   . HOH (hard  of hearing)   . Hx of pancreatitis 2015  . Hypertension   . Lower extremity edema   . Mitral regurgitation    a. echo 10/2013: EF 62%, noWMA, mildly dilated LA, mild to mod MR/TR, GR1DD  . Myocardial infarction ([place])   . Orthopnea   . Pneumonia   . Renal cancer ([place])   . Renal insufficiency    Pt is on dialysis on M,W + F.  . Wheezing     Medications:   (Not in a hospital admission) Scheduled:  . insulin aspart  0-9 Units Subcutaneous TID WC   Assessment: Pharmacy to dose Vancomycin and Zosyn in this ESRD patient requiring HD Mon, Wed, and Fri as  an outpatient for treatment of osteomyelitis      Goal of Therapy:  VT 15-25  Plan:  Will give Vancomycin 2 g IV (load) and will start Vancomycin 1 g IV qHD thereafter. Will order Vancomycin level prior to the 3rd HD session per policy.  Zosyn: Will start Zosyn 3.375 g Iv q12 hours.   Kaisey Huseby D 09/30/2017,4:49 PM

## 2017-09-30 NOTE — H&P (Signed)
[facility] [place] NAME: [name]    MR#:  [id number]  DATE OF BIRTH:  10-12-1960  DATE OF ADMISSION:  09/30/2017  PRIMARY CARE PHYSICIAN: Freddy Finner, NP   REQUESTING/REFERRING PHYSICIAN: [place]  CHIEF COMPLAINT:   Chief Complaint  Patient presents with  . Osteomyelitis     HISTORY OF PRESENT ILLNESS: [name]  is a 57 y.o. female with a known history of COPD, ESRD, DM, CAD, Diastolic CHF- had podiatry did nail cutting last week, had some bleed after that. Applied dressing and ointment. Yesterday she noted discoloration of her toe and some pain. Went to PMD- he gave oral Abx and sent for Xray to hospital. She was noted to have Osteomyelitis on Xray so PMD called her to go to ER. Received her regular HD today.  She had a fall 2 weeks ago and since than her right knee is hurting. She tried all OTC pain meds and icy hot, but not helping .  PAST MEDICAL HISTORY:   Past Medical History:  Diagnosis Date  . Anemia   . Anginal pain ([place])   . Anxiety   . Arthritis   . Asthma   . Broken wrist   . Bronchitis   . chronic diastolic CHF 2/87/6811  . COPD (chronic obstructive pulmonary disease) ([place])   . Coronary artery disease    a. cath 2013: stenting to RCA (report not available); b. cath 2014: LM nl, pLAD 40%, mLAD nl, ost LCx 40%, mid LCx nl, pRCA 30% @ site of prior stent, mRCA 50%  . Depression   . Diabetes mellitus without complication ([place])   . Diabetic neuropathy ([place])   . dialysis 2006  . Diverticulosis   . Dizziness   . Dyspnea   . Elevated lipids   . Environmental and seasonal allergies   . ESRD (end stage renal disease) on dialysis ([place])    M-W-F  . GERD (gastroesophageal reflux disease)   . Headache   . History of hiatal hernia   . HOH (hard of hearing)   . Hx of pancreatitis 2015  . Hypertension   . Lower extremity edema   . Mitral regurgitation    a. echo 10/2013: EF 62%, noWMA, mildly dilated LA, mild to mod  MR/TR, GR1DD  . Myocardial infarction ([place])   . Orthopnea   . Pneumonia   . Renal cancer ([place])   . Renal insufficiency    Pt is on dialysis on M,W + F.  . Wheezing     PAST SURGICAL HISTORY:  Past Surgical History:  Procedure Laterality Date  . ABDOMINAL HYSTERECTOMY  1992  . APPENDECTOMY    . CARDIAC CATHETERIZATION Left 07/26/2015   Procedure: Left Heart Cath and Coronary Angiography;  Surgeon: Dionisio David, MD;  Location: [place] [facility];  Service: Cardiovascular;  Laterality: Left;  . CATARACT EXTRACTION W/ INTRAOCULAR LENS IMPLANT Right   . CATARACT EXTRACTION W/PHACO Left 03/10/2017   Procedure: CATARACT EXTRACTION PHACO AND INTRAOCULAR LENS PLACEMENT (IOC);  Surgeon: Birder Robson, MD;  Location: [facility];  Service: Ophthalmology;  Laterality: Left;  [place] 00:51.9 AP% 14.2 CDE 7.39 fluid pack lot # [id number]  . CHOLECYSTECTOMY    . COLONOSCOPY WITH PROPOFOL N/A 08/12/2016   Procedure: COLONOSCOPY WITH PROPOFOL;  Surgeon: Lollie Sails, MD;  Location: [facility] ENDOSCOPY;  Service: Endoscopy;  Laterality: N/A;  . DIALYSIS FISTULA CREATION Left    upper arm  . dialysis grafts    .  ESOPHAGOGASTRODUODENOSCOPY N/A 03/08/2015   Procedure: ESOPHAGOGASTRODUODENOSCOPY (EGD);  Surgeon: Manya Silvas, MD;  Location[facility] ENDOSCOPY;  Service: Endoscopy;  Laterality: N/A;  . ESOPHAGOGASTRODUODENOSCOPY (EGD) WITH PROPOFOL N/A 03/18/2016   Procedure: ESOPHAGOGASTRODUODENOSCOPY (EGD) WITH PROPOFOL;  Surgeon: [name], MD;  Location: [facility] ENDOSCOPY;  Service: Endoscopy;  Laterality: N/A;  . EYE SURGERY Right 2018  . FECAL TRANSPLANT N/A 08/23/2015   Procedure: FECAL TRANSPLANT;  Surgeon: Manya Silvas, MD;  Location: [facility] ENDOSCOPY;  Service: Endoscopy;  Laterality: N/A;  . HAND SURGERY Bilateral   . PERIPHERAL VASCULAR CATHETERIZATION N/A 12/20/2015   Procedure: Thrombectomy of dialysis access versus permcath placement;  Surgeon: Algernon Huxley, MD;  Location: [place] [facility];   Service: Cardiovascular;  Laterality: N/A;  . PERIPHERAL VASCULAR CATHETERIZATION N/A 12/20/2015   Procedure: A/V Shunt Intervention;  Surgeon: Algernon Huxley, MD;  Location: [place] [facility];  Service: Cardiovascular;  Laterality: N/A;  . PERIPHERAL VASCULAR CATHETERIZATION N/A 12/20/2015   Procedure: A/V Shuntogram/Fistulagram;  Surgeon: Algernon Huxley, MD;  Location: [place] [facility];  Service: Cardiovascular;  Laterality: N/A;  . PERIPHERAL VASCULAR CATHETERIZATION N/A 01/02/2016   Procedure: A/V Shuntogram/Fistulagram;  Surgeon: Algernon Huxley, MD;  Location: [place] [facility];  Service: Cardiovascular;  Laterality: N/A;  . PERIPHERAL VASCULAR CATHETERIZATION N/A 01/02/2016   Procedure: A/V Shunt Intervention;  Surgeon: Algernon Huxley, MD;  Location: [place] [facility];  Service: Cardiovascular;  Laterality: N/A;    SOCIAL HISTORY:  Social History   Tobacco Use  . Smoking status: Former Smoker    Packs/day: 0.50    Years: 40.00    Pack years: 20.00    Types: Cigarettes    Last attempt to quit: 02/13/2015    Years since quitting: 2.6  . Smokeless tobacco: Never Used  Substance Use Topics  . Alcohol use: Yes    Alcohol/week: 0.6 oz    Types: 1 Glasses of wine per week    Comment: glass wine week per pt    FAMILY HISTORY:  Family History  Problem Relation Age of Onset  . Kidney disease Mother   . Diabetes Mother   . Cancer Father   . Kidney disease Sister     DRUG ALLERGIES:  Allergies  Allergen Reactions  . Ace Inhibitors Swelling and Anaphylaxis  . Ativan [Lorazepam] Other (See Comments)    Reaction:Hallucinations and headaches Reaction:Hallucinations and headaches Reaction:  Hallucinations and headaches  . Compazine [Prochlorperazine Edisylate] Anaphylaxis and Nausea And Vomiting    Other reaction(s): dystonia from this vs. reglan 23 Jul - patient relates that she takes promethazine frequently with no problems. 23 Jul - patient relates that she takes  promethazine frequently with no problems.  . Sumatriptan Succinate     Other reaction(s): delirium and hallucinations per [facility] records  . Dilaudid [Hydromorphone Hcl] Other (See Comments)    Delirium   . Ondansetron Other (See Comments)    hallucinations    . Zofran [Ondansetron Hcl] Other (See Comments)    Reaction:  hallucinations  Reaction:  hallucinations   . Codeine Nausea And Vomiting  . Gabapentin Other (See Comments)    Reaction:  Unknown    . [place] Nausea And Vomiting  . Losartan Nausea Only  . Oxycodone Anxiety  . Prochlorperazine Other (See Comments)    Reaction:  Unknown . Patient does not remember reaction but she does have vertigo and anxiety along with n and v at times. Could be used to treat any  of these   . Reglan [Metoclopramide]     Per patient her Dr. Evelina Bucy her off it  Per patient her Dr. Evelina Bucy her off it   . Scopolamine Other (See Comments)    Dizziness, also has vertigo already  . Tape Rash  . Tapentadol Rash    REVIEW OF SYSTEMS:   CONSTITUTIONAL: No fever, fatigue or weakness.  EYES: No blurred or double vision.  EARS, NOSE, AND THROAT: No tinnitus or ear pain.  RESPIRATORY: No cough, shortness of breath, wheezing or hemoptysis.  CARDIOVASCULAR: No chest pain, orthopnea, edema.  GASTROINTESTINAL: No nausea, vomiting, diarrhea or abdominal pain.  GENITOURINARY: No dysuria, hematuria.  ENDOCRINE: No polyuria, nocturia,  HEMATOLOGY: No anemia, easy bruising or bleeding SKIN: No rash or lesion. MUSCULOSKELETAL: No joint pain or arthritis.   NEUROLOGIC: No tingling, numbness, weakness.  PSYCHIATRY: No anxiety or depression.   MEDICATIONS AT HOME:  Prior to Admission medications   Medication Sig Start Date End Date Taking? Authorizing Provider  albuterol (PROVENTIL HFA;VENTOLIN HFA) 108 (90 Base) MCG/ACT inhaler Inhale 2 puffs into the lungs every 6 (six) hours as needed for shortness of breath.    Yes [provider]  albuterol  (PROVENTIL) (2.5 MG/3ML) 0.083% nebulizer solution Take 2.5 mg by nebulization every 6 (six) hours as needed for wheezing or shortness of breath.   Yes [provider]  alum & mag hydroxide-simeth (MAALOX MAX) 400-400-40 MG/5ML suspension Take 5 mLs by mouth every 6 (six) hours as needed for indigestion. 06/09/17  Yes Veronese, [place], MD  amLODipine (NORVASC) 10 MG tablet Take 10 mg by mouth daily.   Yes [provider]  aspirin EC 81 MG tablet Take 81 mg by mouth daily. 09/08/16  Yes [provider]  atorvastatin (LIPITOR) 20 MG tablet Take 20 mg by mouth at bedtime.  12/02/16  Yes [provider]  carvedilol (COREG) 25 MG tablet Take 25 mg by mouth 2 (two) times daily with a meal.   Yes [provider]  cetirizine (ZYRTEC) 10 MG tablet Take 10 mg by mouth daily.    Yes [provider]  cholecalciferol (VITAMIN D) 400 units TABS tablet Take 400 Units by mouth daily.   Yes [provider]  citalopram (CELEXA) 20 MG tablet Take 20 mg by mouth daily.   Yes [provider]  clopidogrel (PLAVIX) 75 MG tablet Take 1 tablet (75 mg total) by mouth daily. 03/25/17 03/25/18 Yes Dustin Flock, MD  cyanocobalamin 1000 MCG tablet Take 1,000 mcg by mouth daily with lunch.    Yes [provider]  diphenhydrAMINE (BENADRYL) 25 mg capsule Take 25 mg by mouth every 6 (six) hours as needed for itching.   Yes [provider]  glucose 4 GM chewable tablet Chew 3-4 tablets by mouth as needed for low blood sugar (blood sugar less than 60).    Yes [provider]  HYDROcodone-acetaminophen (NORCO/VICODIN) 5-325 MG tablet Take 1 tablet by mouth every 6 (six) hours as needed for moderate pain or severe pain. Patient taking differently: Take 1 tablet by mouth 3 (three) times daily as needed for moderate pain or severe pain.  05/05/17  Yes Gouru, Illene Silver, MD  hydrOXYzine (ATARAX/VISTARIL) 25 MG tablet Take 25 mg by mouth every 6 (six)  hours as needed.    Yes [provider]  insulin detemir (LEVEMIR) 100 UNIT/ML injection Inject 0.02 mLs (2 Units total) into the skin 2 (two) times daily. 09/05/17  Yes Epifanio Lesches, MD  levofloxacin (  LEVAQUIN) 750 MG tablet Take 750 mg by mouth every Monday, Wednesday, and Friday.   Yes [provider]  meclizine (ANTIVERT) 12.5 MG tablet Take 1 tablet by mouth three times daily as needed   Yes [provider]  metoCLOPramide (REGLAN) 10 MG tablet Take 1 tablet (10 mg total) by mouth every 8 (eight) hours as needed for nausea or vomiting. Patient taking differently: Take 5 mg by mouth 4 (four) times daily -  with meals and at bedtime.  06/08/17  Yes Earleen Newport, MD  multivitamin (RENA-VIT) TABS tablet Take 1 tablet by mouth daily.    Yes [provider]  nitroGLYCERIN (NITROSTAT) 0.4 MG SL tablet Place 0.4 mg under the tongue every 5 (five) minutes x 3 doses as needed.   Yes [provider]  NOVOLOG FLEXPEN 100 UNIT/ML FlexPen Inject 4-12 Units into the skin 3 (three) times daily before meals. Per sliding scale 12/03/16  Yes [provider]  omega-3 acid ethyl esters (LOVAZA) 1 g capsule Take 1 g by mouth daily.    Yes [provider]  pantoprazole (PROTONIX) 40 MG tablet Take 1 tablet (40 mg total) by mouth 2 (two) times daily before a meal. 03/19/16  Yes Gladstone Lighter, MD  promethazine (PHENERGAN) 12.5 MG tablet Take 12.5 mg by mouth every 6 (six) hours as needed for nausea or vomiting.   Yes [provider]  ranitidine (ZANTAC) 150 MG tablet Take 150 mg by mouth 2 (two) times daily. In the afternoon   Yes [provider]  ranolazine (RANEXA) 500 MG 12 hr tablet Take 1,000 mg by mouth 2 (two) times daily.   Yes [provider]  rifaximin (XIFAXAN) 550 MG TABS tablet Take 550 mg by mouth 2 (two) times daily.   Yes [provider]  sucralfate (CARAFATE) 1 g tablet Take 1 g by mouth 4  (four) times daily -  with meals and at bedtime.   Yes [provider]  famotidine (PEPCID) 20 MG tablet Take 1 tablet (20 mg total) by mouth 2 (two) times daily. 06/09/17 06/09/18  Rudene Re, MD  FLUoxetine (PROZAC) 20 MG capsule Take 1 capsule (20 mg total) by mouth daily. Patient taking differently: Take 20 mg by mouth at bedtime.  04/10/16   Hower, Aaron Mose, MD  lipase/protease/amylase (CREON) 12000 UNITS CPEP capsule Take 24,000-36,000 Units by mouth 3 (three) times daily with meals. Take 36000 mg in the morning, 24000 midday & 36000 in the evening    [provider]  LORazepam (ATIVAN) 1 MG tablet Take 1 tablet (1 mg total) by mouth 2 (two) times daily. 06/08/17 06/08/18  Earleen Newport, MD  OLANZapine (ZYPREXA) 5 MG tablet Take 0.5 tablets (2.5 mg total) by mouth at bedtime. Patient taking differently: Take 5 mg by mouth at bedtime.  04/29/16   Loletha Grayer, MD      PHYSICAL EXAMINATION:   VITAL SIGNS: Blood pressure (!) 111/46, pulse 65, temperature 97.9 F (36.6 C), temperature source Oral, resp. rate 18, height 5' 5.5" (1.664 m), weight 83.6 kg (184 lb 6.4 oz), SpO2 99 %.  GENERAL:  57 y.o.-year-old patient lying in the bed with no acute distress.  EYES: Pupils equal, round, reactive to light and accommodation. No scleral icterus. Extraocular muscles intact.  HEENT: Head atraumatic, normocephalic. Oropharynx and nasopharynx clear.  NECK:  Supple, no jugular venous distention. No thyroid enlargement, no tenderness.  LUNGS: Normal breath sounds bilaterally, no wheezing, rales,rhonchi or crepitation. No use  of accessory muscles of respiration.  CARDIOVASCULAR: S1, S2 normal. No murmurs, rubs, or gallops.  ABDOMEN: Soft, nontender, nondistended. Bowel sounds present. No organomegaly or mass.  EXTREMITIES: right knee tender all over, left 1st toe have black and yellowish discoloration , no discharge. Left arm AV graft present.  NEUROLOGIC: Cranial nerves II  through XII are intact. Muscle strength 5/5 in all extremities. Sensation intact. Gait not checked.  PSYCHIATRIC: The patient is alert and oriented x 3.  SKIN: No obvious rash, lesion, or ulcer.   LABORATORY PANEL:   CBC Recent Labs  Lab 09/30/17 1555  WBC 5.8  HGB 10.4*  HCT 32.0*  PLT 154  MCV 92.4  MCH 30.0  MCHC 32.4  RDW 14.6*  LYMPHSABS 1.8  MONOABS 0.8  EOSABS 0.4  BASOSABS 0.0   ------------------------------------------------------------------------------------------------------------------  Chemistries  No results for input(s): NA, K, CL, CO2, GLUCOSE, BUN, CREATININE, CALCIUM, MG, AST, ALT, ALKPHOS, BILITOT in the last 168 hours.  Invalid input(s): GFRCGP ------------------------------------------------------------------------------------------------------------------ CrCl cannot be calculated (Patient's most recent lab result is older than the maximum 21 days allowed.). ------------------------------------------------------------------------------------------------------------------ No results for input(s): TSH, T4TOTAL, T3FREE, THYROIDAB in the last 72 hours.  Invalid input(s): FREET3   Coagulation profile No results for input(s): INR, PROTIME in the last 168 hours. ------------------------------------------------------------------------------------------------------------------- No results for input(s): DDIMER in the last 72 hours. -------------------------------------------------------------------------------------------------------------------  Cardiac Enzymes No results for input(s): CKMB, TROPONINI, MYOGLOBIN in the last 168 hours.  Invalid input(s): CK ------------------------------------------------------------------------------------------------------------------ Invalid input(s): POCBNP  ---------------------------------------------------------------------------------------------------------------  Urinalysis    Component Value Date/Time    COLORURINE YELLOW (A) 06/09/2017 1039   APPEARANCEUR CLEAR (A) 06/09/2017 1039   APPEARANCEUR Clear 09/15/2014 0947   LABSPEC 1.014 06/09/2017 1039   LABSPEC 1.008 09/15/2014 0947   PHURINE 8.0 06/09/2017 1039   GLUCOSEU 150 (A) 06/09/2017 1039   GLUCOSEU 150 mg/dL 09/15/2014 0947   HGBUR SMALL (A) 06/09/2017 1039   BILIRUBINUR NEGATIVE 06/09/2017 1039   BILIRUBINUR Negative 09/15/2014 0947   KETONESUR NEGATIVE 06/09/2017 1039   PROTEINUR >=300 (A) 06/09/2017 1039   UROBILINOGEN 0.2 06/30/2010 2130   NITRITE NEGATIVE 06/09/2017 1039   LEUKOCYTESUR NEGATIVE 06/09/2017 1039   LEUKOCYTESUR Negative 09/15/2014 0947     RADIOLOGY: Dg Knee Complete 4 Views Right  Result Date: 09/30/2017 CLINICAL DATA:  Fall, injury to right knee EXAM: RIGHT KNEE - COMPLETE 4+ VIEW COMPARISON:  01/06/2012 FINDINGS: No acute bony abnormality. Specifically, no fracture, subluxation, or dislocation. No joint effusion. Joint spaces maintained. Vascular calcifications noted. IMPRESSION: No acute bony abnormality. Electronically Signed   By: Rolm Baptise M.D.   On: 09/30/2017 15:27   Dg Toe Great Left  Result Date: 09/30/2017 CLINICAL DATA:  Diabetic ulcer of left great toe. EXAM: LEFT GREAT TOE-3 VIEW COMPARISON:  None. FINDINGS: Soft tissue swelling and ulcer is seen overlying the distal phalanx. Osteolysis is seen involving the tuft of the distal phalanx, consistent with osteomyelitis. No other bone lesions identified. IMPRESSION: Osteomyelitis involving tuft of distal phalanx of great toe. Electronically Signed   By: Earle Gell M.D.   On: 09/30/2017 08:18    EKG: Orders placed or performed during the hospital encounter of 09/03/17  . ED EKG  . ED EKG  . EKG 12-Lead  . EKG 12-Lead  . EKG    IMPRESSION AND PLAN:  * OSteomyelitis   IV vanc+ Zosyn   Cx sent   Podiatry consult   Hold plavix, as she may need surgery.  * DM   Iss.   *  Htn   Hold meds   BP is low normal  * ESRD on HD   Nephro  consult- HD days MWF  * CAD   Hold plavix anticipate sx, Cont asa.  * Right knee pain   Xray negative    Could it be legamental injury   Consult ortho    All the records are reviewed and case discussed with ED provider. Management plans discussed with the patient, family and they are in agreement.  CODE STATUS: Full. Code Status History    Date Active Date Inactive Code Status Order ID Comments User Context   09/03/2017 22:43 09/05/2017 19:01 Full Code [id number]  Lance Coon, MD Inpatient   05/04/2017 18:33 05/05/2017 23:30 Full Code [id number]  Henreitta Leber, MD Inpatient   03/23/2017 15:51 03/25/2017 17:39 Full Code [id number]  Gladstone Lighter, MD Inpatient   04/21/2016 15:59 04/21/2016 17:17 Full Code [id number]  Nicholes Mango, MD Inpatient   04/08/2016 14:15 04/11/2016 19:47 Full Code [id number]  Theodoro Grist, MD Inpatient   03/15/2016 13:22 03/20/2016 00:19 Full Code [id number]  Bettey Costa, MD Inpatient   02/16/2016 14:09 02/19/2016 22:38 Full Code [id number]  Bettey Costa, MD ED   12/17/2015 11:39 12/21/2015 22:09 Full Code [id number]  Dustin Flock, MD ED   11/12/2015 16:02 12/01/2015 19:43 Full Code [id number]  Bettey Costa, MD Inpatient   10/07/2015 09:37 10/12/2015 19:59 Full Code [id number]  Fritzi Mandes, MD ED   10/03/2015 03:55 10/05/2015 22:30 Full Code [id number]  Lance Coon, MD Inpatient   08/31/2015 16:00 09/03/2015 22:09 Full Code [id number]  Demetrios Loll, MD ED   07/26/2015 15:28 07/26/2015 21:36 Full Code [id number]  Dionisio David, MD Inpatient   07/22/2015 17:36 07/26/2015 15:28 Full Code [id number]  Vaughan Basta, MD Inpatient   06/05/2015 20:39 06/07/2015 20:03 Full Code [id number]  Bettey Costa, MD Inpatient   05/21/2015 21:42 05/24/2015 16:26 Full Code [id number]  Henreitta Leber, MD Inpatient   04/24/2015 08:55 04/26/2015 22:11 Full Code [id number]  Demetrios Loll, MD Inpatient   04/01/2015 15:58 04/05/2015 20:07 Full Code [id number]  Epifanio Lesches, MD ED   03/06/2015 08:05 03/09/2015  21:11 Full Code [id number]  Juluis Mire, MD Inpatient   02/19/2015 16:14 02/24/2015 17:41 Full Code [id number]  Aldean Jewett, MD Inpatient       TOTAL TIME TAKING CARE OF THIS PATIENT: 45 minutes.    Vaughan Basta M.D on 09/30/2017   Between 7am to 6pm - Pager - [phone]  After 6pm go to [email or web address] - password [email or web address] [place] [facility]  Office  [phone]  CC: Primary care physician; Freddy Finner, NP   Note: This dictation was prepared with Dragon dictation along with smaller phrase technology. Any transcriptional errors that result from this process are unintentional.

## 2017-09-30 NOTE — Consult Note (Signed)
Patient Demographics  [name], is a 57 y.o. female   MRN: [id number]   DOB - 1960/12/09  Admit Date - 09/30/2017    Outpatient Primary MD for the patient is Freddy Finner, NP  Consult requested in the Hospital by Vaughan Basta, *, On 09/30/2017    Reason for consult osteomyelitis to left great toe   With History of -  Past Medical History:  Diagnosis Date  . Anemia   . Anginal pain ([place])   . Anxiety   . Arthritis   . Asthma   . Broken wrist   . Bronchitis   . chronic diastolic CHF 0/94/7096  . COPD (chronic obstructive pulmonary disease) ([place])   . Coronary artery disease    a. cath 2013: stenting to RCA (report not available); b. cath 2014: LM nl, pLAD 40%, mLAD nl, ost LCx 40%, mid LCx nl, pRCA 30% @ site of prior stent, mRCA 50%  . Depression   . Diabetes mellitus without complication ([place])   . Diabetic neuropathy ([place])   . dialysis 2006  . Diverticulosis   . Dizziness   . Dyspnea   . Elevated lipids   . Environmental and seasonal allergies   . ESRD (end stage renal disease) on dialysis ([place])    M-W-F  . GERD (gastroesophageal reflux disease)   . Headache   . History of hiatal hernia   . HOH (hard of hearing)   . Hx of pancreatitis 2015  . Hypertension   . Lower extremity edema   . Mitral regurgitation    a. echo 10/2013: EF 62%, noWMA, mildly dilated LA, mild to mod MR/TR, GR1DD  . Myocardial infarction ([place])   . Orthopnea   . Pneumonia   . Renal cancer ([place])   . Renal insufficiency    Pt is on dialysis on M,W + F.  . Wheezing       Past Surgical History:  Procedure Laterality Date  . ABDOMINAL HYSTERECTOMY  1992  . APPENDECTOMY    . CARDIAC CATHETERIZATION Left 07/26/2015   Procedure: Left Heart Cath and Coronary Angiography;  Surgeon: Dionisio David, MD;  Location: [facility];  Service: Cardiovascular;  Laterality: Left;  . CATARACT EXTRACTION W/ INTRAOCULAR LENS IMPLANT Right   . CATARACT EXTRACTION  W/PHACO Left 03/10/2017   Procedure: CATARACT EXTRACTION PHACO AND INTRAOCULAR LENS PLACEMENT (IOC);  Surgeon: Birder Robson, MD;  Location: [facility];  Service: Ophthalmology;  Laterality: Left;  [place] 00:51.9 AP% 14.2 CDE 7.39 fluid pack lot # [id number]  . CHOLECYSTECTOMY    . COLONOSCOPY WITH PROPOFOL N/A 08/12/2016   Procedure: COLONOSCOPY WITH PROPOFOL;  Surgeon: Lollie Sails, MD;  Location: [facility] ENDOSCOPY;  Service: Endoscopy;  Laterality: N/A;  . DIALYSIS FISTULA CREATION Left    upper arm  . dialysis grafts    . ESOPHAGOGASTRODUODENOSCOPY N/A 03/08/2015   Procedure: ESOPHAGOGASTRODUODENOSCOPY (EGD);  Surgeon: Manya Silvas, MD;  Location: [facility] ENDOSCOPY;  Service: Endoscopy;  Laterality: N/A;  . ESOPHAGOGASTRODUODENOSCOPY (EGD) WITH PROPOFOL N/A 03/18/2016   Procedure: ESOPHAGOGASTRODUODENOSCOPY (EGD) WITH PROPOFOL;  Surgeon: [name], MD;  Location: [facility] ENDOSCOPY;  Service: Endoscopy;  Laterality: N/A;  . EYE SURGERY Right 2018  . FECAL TRANSPLANT N/A 08/23/2015   Procedure: FECAL TRANSPLANT;  Surgeon: Manya Silvas, MD;  Location: [facility] ENDOSCOPY;  Service: Endoscopy;  Laterality: N/A;  . HAND SURGERY Bilateral   . PERIPHERAL VASCULAR CATHETERIZATION N/A 12/20/2015   Procedure: Thrombectomy of dialysis access versus permcath placement;  Surgeon: Corene Cornea  Bunnie Domino, MD;  Location: [facility];  Service: Cardiovascular;  Laterality: N/A;  . PERIPHERAL VASCULAR CATHETERIZATION N/A 12/20/2015   Procedure: A/V Shunt Intervention;  Surgeon: Algernon Huxley, MD;  Location: [place] [facility];  Service: Cardiovascular;  Laterality: N/A;  . PERIPHERAL VASCULAR CATHETERIZATION N/A 12/20/2015   Procedure: A/V Shuntogram/Fistulagram;  Surgeon: Algernon Huxley, MD;  Location: [facility];  Service: Cardiovascular;  Laterality: N/A;  . PERIPHERAL VASCULAR CATHETERIZATION N/A 01/02/2016   Procedure: A/V Shuntogram/Fistulagram;  Surgeon: Algernon Huxley, MD;  Location: [place] [facility];   Service: Cardiovascular;  Laterality: N/A;  . PERIPHERAL VASCULAR CATHETERIZATION N/A 01/02/2016   Procedure: A/V Shunt Intervention;  Surgeon: Algernon Huxley, MD;  Location: [facility];  Service: Cardiovascular;  Laterality: N/A;    in for   Chief Complaint  Patient presents with  . Osteomyelitis      HPI  [name]  is a 57 y.o. female, patient was seen at her primary care doctor yesterday and after an x-ray that showed possible osteomyelitis she referred to the emergency room.  I am consulted today for osteomyelitis possibility in the left great toe.  She is also been a patient of my practice partner Dr. Sherren Mocha cline  and has had wounds on that digit off and on for a number of months.    Review of Systems patient is afebrile  In addition to the HPI above,  No Fever-chills, No Headache, No changes with Vision or hearing, No problems swallowing food or Liquids, No Chest pain, Cough or Shortness of Breath, No Abdominal pain, No Nausea or Vommitting, Bowel movements are regular, No Blood in stool or Urine, No dysuria, The left lower extremity has some swelling in the left leg and tenderness to palpation around the mid calf to the ankle.  Some erythematous region consistent with inflammation possible cellulitis to this area.  Could be just dermatitis due to swelling however.  Also of note there is an ulceration of distal tip of the left hallux that appears to be a chronic wound. No new joints pains-aches,  No new weakness, tingling, numbness in any extremity, No recent weight gain or loss, No polyuria, polydypsia or polyphagia, No significant Mental Stressors.  A full 10 point Review of Systems was done, except as stated above, all other Review of Systems were negative.   Social History Social History   Tobacco Use  . Smoking status: Former Smoker    Packs/day: 0.50    Years: 40.00    Pack years: 20.00    Types: Cigarettes    Last attempt to quit: 02/13/2015    Years  since quitting: 2.6  . Smokeless tobacco: Never Used  Substance Use Topics  . Alcohol use: Yes    Alcohol/week: 0.6 oz    Types: 1 Glasses of wine per week    Comment: glass wine week per pt    Family History Family History  Problem Relation Age of Onset  . Kidney disease Mother   . Diabetes Mother   . Cancer Father   . Kidney disease Sister      Prior to Admission medications   Medication Sig Start Date End Date Taking? Authorizing Provider  albuterol (PROVENTIL HFA;VENTOLIN HFA) 108 (90 Base) MCG/ACT inhaler Inhale 2 puffs into the lungs every 6 (six) hours as needed for shortness of breath.    Yes [provider]  albuterol (PROVENTIL) (2.5 MG/3ML) 0.083% nebulizer solution Take 2.5 mg  by nebulization every 6 (six) hours as needed for wheezing or shortness of breath.   Yes [provider]  alum & mag hydroxide-simeth (MAALOX MAX) 400-400-40 MG/5ML suspension Take 5 mLs by mouth every 6 (six) hours as needed for indigestion. 06/09/17  Yes Veronese, [place], MD  amLODipine (NORVASC) 10 MG tablet Take 10 mg by mouth daily.   Yes [provider]  aspirin EC 81 MG tablet Take 81 mg by mouth daily. 09/08/16  Yes [provider]  atorvastatin (LIPITOR) 20 MG tablet Take 20 mg by mouth at bedtime.  12/02/16  Yes [provider]  carvedilol (COREG) 25 MG tablet Take 25 mg by mouth 2 (two) times daily with a meal.   Yes [provider]  cetirizine (ZYRTEC) 10 MG tablet Take 10 mg by mouth daily.    Yes [provider]  cholecalciferol (VITAMIN D) 400 units TABS tablet Take 400 Units by mouth daily.   Yes [provider]  ciprofloxacin (CIPRO) 500 MG tablet Take 500 mg by mouth 2 (two) times daily. 09/29/17 09/27/2017 Yes [provider]  clindamycin (CLEOCIN) 300 MG capsule Take 300 mg by mouth 2 (two) times daily. 09/29/17 10/07/2017 Yes [provider]  clopidogrel (PLAVIX) 75 MG tablet Take 1 tablet (75 mg  total) by mouth daily. 03/25/17 03/25/18 Yes Dustin Flock, MD  cyanocobalamin 1000 MCG tablet Take 1,000 mcg by mouth daily with lunch.    Yes [provider]  dexlansoprazole (DEXILANT) 60 MG capsule Take 60 mg by mouth daily.   Yes [provider]  diphenhydrAMINE (BENADRYL) 25 mg capsule Take 25 mg by mouth every 6 (six) hours as needed for itching.   Yes [provider]  FLUoxetine (PROZAC) 20 MG capsule Take 1 capsule (20 mg total) by mouth daily. Patient taking differently: Take 20 mg by mouth at bedtime.  04/10/16  Yes Hower, Aaron Mose, MD  glucose 4 GM chewable tablet Chew 3-4 tablets by mouth as needed for low blood sugar (blood sugar less than 60).    Yes [provider]  HYDROcodone-acetaminophen (NORCO/VICODIN) 5-325 MG tablet Take 1 tablet by mouth every 6 (six) hours as needed for moderate pain or severe pain. Patient taking differently: Take 1 tablet by mouth 3 (three) times daily as needed for moderate pain or severe pain.  05/05/17  Yes Gouru, Illene Silver, MD  hydrOXYzine (ATARAX/VISTARIL) 25 MG tablet Take 25 mg by mouth every 6 (six) hours as needed.    Yes [provider]  insulin detemir (LEVEMIR) 100 UNIT/ML injection Inject 0.02 mLs (2 Units total) into the skin 2 (two) times daily. Patient taking differently: Inject 4 Units into the skin at bedtime.  09/05/17  Yes Epifanio Lesches, MD  levofloxacin (LEVAQUIN) 750 MG tablet Take 750 mg by mouth every Monday, Wednesday, and Friday.   Yes [provider]  lipase/protease/amylase (CREON) 12000 UNITS CPEP capsule Take 24,000-36,000 Units by mouth 3 (three) times daily with meals. Take 36000 mg in the morning, 24000 midday & 36000 in the evening   Yes [provider]  meclizine (ANTIVERT) 12.5 MG tablet Take 1 tablet by mouth three times daily as needed   Yes [provider]  metoCLOPramide (REGLAN) 10 MG tablet Take 1 tablet (10 mg total) by mouth every 8 (eight) hours  as needed for nausea or vomiting. Patient taking differently: Take 5 mg by mouth 4 (four) times daily -  with meals and at bedtime.  06/08/17  Yes Lenise Arena  E, MD  multivitamin (RENA-VIT) TABS tablet Take 1 tablet by mouth daily.    Yes [provider]  nitroGLYCERIN (NITROSTAT) 0.4 MG SL tablet Place 0.4 mg under the tongue every 5 (five) minutes x 3 doses as needed.   Yes [provider]  omega-3 acid ethyl esters (LOVAZA) 1 g capsule Take 1 g by mouth daily.    Yes [provider]  promethazine (PHENERGAN) 12.5 MG tablet Take 12.5 mg by mouth every 6 (six) hours as needed for nausea or vomiting.   Yes [provider]  ranitidine (ZANTAC) 150 MG tablet Take 150 mg by mouth 2 (two) times daily. In the afternoon   Yes [provider]  ranolazine (RANEXA) 500 MG 12 hr tablet Take 1,000 mg by mouth 2 (two) times daily.   Yes [provider]  rifaximin (XIFAXAN) 550 MG TABS tablet Take 550 mg by mouth 2 (two) times daily.   Yes [provider]  sevelamer carbonate (RENVELA) 800 MG tablet Take 800 mg by mouth 3 (three) times daily with meals.   Yes [provider]  sucralfate (CARAFATE) 1 g tablet Take 1 g by mouth 4 (four) times daily -  with meals and at bedtime.   Yes [provider]  famotidine (PEPCID) 20 MG tablet Take 1 tablet (20 mg total) by mouth 2 (two) times daily. Patient not taking: Reported on 09/30/2017 06/09/17 06/09/18  Rudene Re, MD  LORazepam (ATIVAN) 1 MG tablet Take 1 tablet (1 mg total) by mouth 2 (two) times daily. Patient not taking: Reported on 09/30/2017 06/08/17 06/08/18  Earleen Newport, MD  OLANZapine (ZYPREXA) 5 MG tablet Take 0.5 tablets (2.5 mg total) by mouth at bedtime. Patient not taking: Reported on 09/30/2017 04/29/16   Loletha Grayer, MD  pantoprazole (PROTONIX) 40 MG tablet Take 1 tablet (40 mg total) by mouth 2 (two) times daily before a meal. Patient not taking:  Reported on 09/30/2017 03/19/16   Gladstone Lighter, MD    Anti-infectives (From admission, onward)   Start     Dose/Rate Route Frequency Ordered Stop   10/02/17 1200  vancomycin (VANCOCIN) IVPB 1000 mg/200 mL premix     1,000 mg 200 mL/hr over 60 Minutes Intravenous Every M-W-F (Hemodialysis) 09/30/17 1635     10/01/17 0200  piperacillin-tazobactam (ZOSYN) IVPB 3.375 g     3.375 g 12.5 mL/hr over 240 Minutes Intravenous Every 12 hours 09/30/17 1635     09/30/17 1730  vancomycin (VANCOCIN) 2,000 mg in sodium chloride 0.9 % 500 mL IVPB     2,000 mg 250 mL/hr over 120 Minutes Intravenous  Once 09/30/17 1631     09/30/17 1645  vancomycin (VANCOCIN) 2,000 mg in sodium chloride 0.9 % 500 mL IVPB  Status:  Discontinued     2,000 mg 250 mL/hr over 120 Minutes Intravenous  Once 09/30/17 1631 09/30/17 1631   09/30/17 1515  piperacillin-tazobactam (ZOSYN) IVPB 3.375 g     3.375 g 100 mL/hr over 30 Minutes Intravenous  Once 09/30/17 1507 09/30/17 1703   09/30/17 1515  vancomycin (VANCOCIN) IVPB 1000 mg/200 mL premix  Status:  Discontinued     1,000 mg 200 mL/hr over 60 Minutes Intravenous  Once 09/30/17 1507 09/30/17 1631      Scheduled Meds: . insulin aspart  0-9 Units Subcutaneous TID WC   Continuous Infusions: . [START ON 10/01/2017] piperacillin-tazobactam (ZOSYN)  IV    . vancomycin 2,000 mg (09/30/17 1724)  . [START ON 10/02/2017] vancomycin  PRN Meds:.oxyCODONE-acetaminophen  Allergies  Allergen Reactions  . Ace Inhibitors Swelling and Anaphylaxis  . Ativan [Lorazepam] Other (See Comments)    Reaction:Hallucinations and headaches Reaction:Hallucinations and headaches Reaction:  Hallucinations and headaches  . Compazine [Prochlorperazine Edisylate] Anaphylaxis and Nausea And Vomiting    Other reaction(s): dystonia from this vs. reglan 23 Jul - patient relates that she takes promethazine frequently with no problems. 23 Jul - patient relates that she takes promethazine  frequently with no problems.  . Sumatriptan Succinate     Other reaction(s): delirium and hallucinations per [facility] records  . Dilaudid [Hydromorphone Hcl] Other (See Comments)    Delirium   . Ondansetron Other (See Comments)    hallucinations    . Zofran [Ondansetron Hcl] Other (See Comments)    Reaction:  hallucinations  Reaction:  hallucinations   . Codeine Nausea And Vomiting  . Gabapentin Other (See Comments)    Reaction:  Unknown    . [place] Nausea And Vomiting  . Losartan Nausea Only  . Oxycodone Anxiety  . Prochlorperazine Other (See Comments)    Reaction:  Unknown . Patient does not remember reaction but she does have vertigo and anxiety along with n and v at times. Could be used to treat any of these   . Reglan [Metoclopramide]     Per patient her Dr. Evelina Bucy her off it  Per patient her Dr. Evelina Bucy her off it   . Scopolamine Other (See Comments)    Dizziness, also has vertigo already  . Tape Rash  . Tapentadol Rash    Physical Exam  Vitals  Blood pressure (!) 141/49, pulse 66, temperature 98.1 F (36.7 C), temperature source Oral, resp. rate 20, height 5' 5.5" (1.664 m), weight 83.6 kg (184 lb 6.4 oz), SpO2 100 %.  Lower Extremity exam:  Vascular: Palpable DP pulse.  PT pulses are more difficult to palpate bilaterally.  Dermatological: Ulceration distal tip of the left hallux.  Wounds approximately 1.2 cm diameter little bit of a central deep portion to it but it appears to be fairly stable there is no purulent drainage from the area.  No localized cellulitis to the toe.  Toes not particular painful but the leg is more tender.  Neurological: Diabetic peripheral neuropathy.  Ortho: Hallux malleus deformity to the left hallux.  Data Review  CBC Recent Labs  Lab 09/30/17 1555  WBC 5.8  HGB 10.4*  HCT 32.0*  PLT 154  MCV 92.4  MCH 30.0  MCHC 32.4  RDW 14.6*  LYMPHSABS 1.8  MONOABS 0.8  EOSABS 0.4  BASOSABS 0.0    ------------------------------------------------------------------------------------------------------------------  Chemistries  Recent Labs  Lab 09/30/17 1555  NA 133*  K 3.9  CL 95*  CO2 28  GLUCOSE 191*  BUN 8  CREATININE 2.91*  CALCIUM 7.8*   ------------------------------------------------------------------------------------------------------------------ estimated creatinine clearance is 22.8 mL/min (A) (by C-G formula based on SCr of 2.91 mg/dL (H)). ------------------------------------------------------------------------------------------------------------------ No results for input(s): TSH, T4TOTAL, T3FREE, THYROIDAB in the last 72 hours.  Invalid input(s): FREET3   Coagulation profile Recent Labs  Lab 09/30/17 1555  INR 0.93   ------------------------------------------------------------------------------------------------------------------- No results for input(s): DDIMER in the last 72 hours. -------------------------------------------------------------------------------------------------------------------  Cardiac Enzymes No results for input(s): CKMB, TROPONINI, MYOGLOBIN in the last 168 hours.  Invalid input(s): CK ------------------------------------------------------------------------------------------------------------------ Invalid input(s): POCBNP   ---------------------------------------------------------------------------------------------------------------  Urinalysis    Component Value Date/Time   COLORURINE YELLOW (A) 06/09/2017 [place] (A) 06/09/2017 [place] 09/15/2014 0947  LABSPEC 1.014 06/09/2017 1039   LABSPEC 1.008 09/15/2014 0947   PHURINE 8.0 06/09/2017 1039   GLUCOSEU 150 (A) 06/09/2017 1039   GLUCOSEU 150 mg/dL 09/15/2014 0947   HGBUR SMALL (A) 06/09/2017 1039   BILIRUBINUR NEGATIVE 06/09/2017 1039   BILIRUBINUR Negative 09/15/2014 0947   KETONESUR NEGATIVE 06/09/2017 1039   PROTEINUR  >=300 (A) 06/09/2017 1039   UROBILINOGEN 0.2 06/30/2010 2130   NITRITE NEGATIVE 06/09/2017 1039   LEUKOCYTESUR NEGATIVE 06/09/2017 1039   LEUKOCYTESUR Negative 09/15/2014 0947     Imaging results:   Dg Knee Complete 4 Views Right  Result Date: 09/30/2017 CLINICAL DATA:  Fall, injury to right knee EXAM: RIGHT KNEE - COMPLETE 4+ VIEW COMPARISON:  01/06/2012 FINDINGS: No acute bony abnormality. Specifically, no fracture, subluxation, or dislocation. No joint effusion. Joint spaces maintained. Vascular calcifications noted. IMPRESSION: No acute bony abnormality. Electronically Signed   By: Rolm Baptise M.D.   On: 09/30/2017 15:27   Dg Toe Great Left  Result Date: 09/30/2017 CLINICAL DATA:  Diabetic ulcer of left great toe. EXAM: LEFT GREAT TOE-3 VIEW COMPARISON:  None. FINDINGS: Soft tissue swelling and ulcer is seen overlying the distal phalanx. Osteolysis is seen involving the tuft of the distal phalanx, consistent with osteomyelitis. No other bone lesions identified. IMPRESSION: Osteomyelitis involving tuft of distal phalanx of great toe. Electronically Signed   By: Earle Gell M.D.   On: 09/30/2017 08:18    Assessment & Plan: Based on the x-rays there does appear to be demineralization of the distal phalanx distal tuft on the left hallux.  This is secondary to a chronic ulceration.  This is been also present as a result of the structural deformity and hallux malleus the patient has.  She does not appear to have a fulminant infection around the region there is no real purulence draining from the region.  There is however some swelling and pain in the lower left leg which could be consistent with some cellulitis in that region possibly secondary to excess swelling.  States she had good bit of swelling yesterday with the foot and leg. Plan: I dressed the foot today we will see how she is responding to the antibiotics tomorrow.  Explained to her that because there is likely osteomyelitis in the toe  that she would likely have a better chance of healing this area if we were to remove that distal phalanx and the distal portion of her toe.  She is amenable to this that she had a wound here for quite a while and wants to try to resolve the problem.  We will see how she is doing tomorrow we will discuss this further.  Principal Problem:   Osteomyelitis [facility])  Family Communication: Plan discussed with patient    Perry Mount M.D on 09/30/2017 at 6:01 PM  Thank you for the consult, we will follow the patient with you in the Hospital.

## 2017-09-30 NOTE — Progress Notes (Signed)
Patient admitted to room 142. Pt able to transfer from stretcher to bed. Alert and oriented x 4. Pt educated about call light system and safety precautions. Admission and skin assessment completed. Pt has a graft on left upper arm and some dry skin noted. She states she had dialysis this morning.  Pt also verbalizes smoking marihuana daily.

## 2017-09-30 NOTE — ED Notes (Signed)
This RN spoke with Pharmacist to confirm which dose of Vanc to administer. Pharm states the 2000 mg dose is to be given now. RN will start this now pt is ready for floor.

## 2017-09-30 NOTE — ED Notes (Signed)
Pt presents today after being seen at [facility] for osteomyelitis. EDP at bedside.

## 2017-09-30 NOTE — ED Triage Notes (Signed)
Pt sent by [facility] for evaluation of osteomyelitis. Pt reports infection in left great toe. Pt completed dialysis today.

## 2017-10-01 LAB — CBC
HCT: 30.8 % — ABNORMAL LOW (ref 35.0–47.0)
Hemoglobin: 9.8 g/dL — ABNORMAL LOW (ref 12.0–16.0)
MCH: 29.4 pg (ref 26.0–34.0)
MCHC: 31.8 g/dL — ABNORMAL LOW (ref 32.0–36.0)
MCV: 92.5 fL (ref 80.0–100.0)
Platelets: 165 10*3/uL (ref 150–440)
RBC: 3.33 MIL/uL — ABNORMAL LOW (ref 3.80–5.20)
RDW: 14.9 % — ABNORMAL HIGH (ref 11.5–14.5)
WBC: 5.1 10*3/uL (ref 3.6–11.0)

## 2017-10-01 LAB — BASIC METABOLIC PANEL
Anion gap: 11 (ref 5–15)
BUN: 10 mg/dL (ref 6–20)
CO2: 28 mmol/L (ref 22–32)
Calcium: 7.7 mg/dL — ABNORMAL LOW (ref 8.9–10.3)
Chloride: 96 mmol/L — ABNORMAL LOW (ref 101–111)
Creatinine, Ser: 4.32 mg/dL — ABNORMAL HIGH (ref 0.44–1.00)
GFR calc Af Amer: 12 mL/min — ABNORMAL LOW (ref >60)
GFR calc non Af Amer: 10 mL/min — ABNORMAL LOW (ref >60)
Glucose, Bld: 101 mg/dL — ABNORMAL HIGH (ref 65–99)
Potassium: 4.3 mmol/L (ref 3.5–5.1)
Sodium: 135 mmol/L (ref 135–145)

## 2017-10-01 LAB — GLUCOSE, CAPILLARY
Glucose-Capillary: 147 mg/dL — ABNORMAL HIGH (ref 65–99)
Glucose-Capillary: 93 mg/dL (ref 65–99)
Glucose-Capillary: 176 mg/dL — ABNORMAL HIGH (ref 65–99)
Glucose-Capillary: 121 mg/dL — ABNORMAL HIGH (ref 65–99)
Glucose-Capillary: 61 mg/dL — ABNORMAL LOW (ref 65–99)
Glucose-Capillary: 62 mg/dL — ABNORMAL LOW (ref 65–99)
Glucose-Capillary: 91 mg/dL (ref 65–99)

## 2017-10-01 MED ORDER — MECLIZINE HCL 12.5 MG PO TABS
12.5000 mg | ORAL_TABLET | Freq: Three times a day (TID) | ORAL | Status: DC | PRN
  Filled 2017-10-01: qty 1

## 2017-10-01 MED ORDER — RENA-VITE PO TABS
1.0000 | ORAL_TABLET | Freq: Every day | ORAL | Status: DC
Start: 2017-10-01 — End: 2017-10-06
  Administered 2017-10-01 – 2017-10-06 (×5): 1 via ORAL
  Filled 2017-10-01 (×6): qty 1

## 2017-10-01 MED ORDER — SUCRALFATE 1 G PO TABS
1.0000 g | ORAL_TABLET | Freq: Three times a day (TID) | ORAL | Status: DC
  Administered 2017-10-01 – 2017-10-06 (×18): 1 g via ORAL
  Filled 2017-10-01 (×18): qty 1

## 2017-10-01 MED ORDER — PROMETHAZINE HCL 25 MG PO TABS
12.5000 mg | ORAL_TABLET | Freq: Four times a day (QID) | ORAL | Status: DC | PRN
  Administered 2017-10-02 – 2017-10-04 (×7): 12.5 mg via ORAL
  Filled 2017-10-01 (×10): qty 1

## 2017-10-01 MED ORDER — HYDROCODONE-ACETAMINOPHEN 5-325 MG PO TABS
1.0000 | ORAL_TABLET | Freq: Three times a day (TID) | ORAL | Status: DC | PRN
  Administered 2017-10-05 – 2017-10-06 (×3): 1 via ORAL
  Filled 2017-10-01 (×3): qty 1

## 2017-10-01 MED ORDER — NITROGLYCERIN 0.4 MG SL SUBL: 0.4000 mg | SUBLINGUAL_TABLET | SUBLINGUAL | Status: DC | PRN

## 2017-10-01 MED ORDER — METOCLOPRAMIDE HCL 10 MG PO TABS
5.0000 mg | ORAL_TABLET | Freq: Three times a day (TID) | ORAL | Status: DC
  Administered 2017-10-01 – 2017-10-02 (×3): 5 mg via ORAL
  Filled 2017-10-01 (×6): qty 1

## 2017-10-01 MED ORDER — OMEGA-3-ACID ETHYL ESTERS 1 G PO CAPS
1.0000 g | ORAL_CAPSULE | Freq: Every day | ORAL | Status: DC
  Administered 2017-10-01 – 2017-10-06 (×5): 1 g via ORAL
  Filled 2017-10-01 (×5): qty 1

## 2017-10-01 MED ORDER — FAMOTIDINE 20 MG PO TABS: 20.0000 mg | ORAL_TABLET | Freq: Two times a day (BID) | ORAL | Status: DC

## 2017-10-01 MED ORDER — RANOLAZINE ER 500 MG PO TB12
1000.0000 mg | ORAL_TABLET | Freq: Two times a day (BID) | ORAL | Status: DC
  Administered 2017-10-01 – 2017-10-06 (×10): 1000 mg via ORAL
  Filled 2017-10-01 (×12): qty 2

## 2017-10-01 MED ORDER — PANCRELIPASE (LIP-PROT-AMYL) 12000-38000 UNITS PO CPEP
36000.0000 [IU] | ORAL_CAPSULE | Freq: Three times a day (TID) | ORAL | Status: DC
  Administered 2017-10-01 – 2017-10-06 (×13): 36000 [IU] via ORAL
  Filled 2017-10-01 (×11): qty 3
  Filled 2017-10-01: qty 1
  Filled 2017-10-01 (×5): qty 3

## 2017-10-01 MED ORDER — HYDROXYZINE HCL 25 MG PO TABS
25.0000 mg | ORAL_TABLET | Freq: Four times a day (QID) | ORAL | Status: DC | PRN
  Filled 2017-10-01: qty 1

## 2017-10-01 MED ORDER — SEVELAMER CARBONATE 800 MG PO TABS
800.0000 mg | ORAL_TABLET | Freq: Three times a day (TID) | ORAL | Status: DC
  Administered 2017-10-01 – 2017-10-04 (×8): 800 mg via ORAL
  Filled 2017-10-01 (×14): qty 1

## 2017-10-01 MED ORDER — RIFAXIMIN 550 MG PO TABS
550.0000 mg | ORAL_TABLET | Freq: Two times a day (BID) | ORAL | Status: DC
  Administered 2017-10-01 – 2017-10-06 (×9): 550 mg via ORAL
  Filled 2017-10-01 (×9): qty 1

## 2017-10-01 MED ORDER — PANCRELIPASE (LIP-PROT-AMYL) 36000-114000 UNITS PO CPEP: 36000.0000 [IU] | ORAL_CAPSULE | Freq: Three times a day (TID) | ORAL | Status: DC

## 2017-10-01 MED ORDER — ACETAMINOPHEN 500 MG PO TABS
500.0000 mg | ORAL_TABLET | Freq: Four times a day (QID) | ORAL | Status: DC | PRN
  Administered 2017-10-04: 500 mg via ORAL
  Filled 2017-10-01: qty 1

## 2017-10-01 MED ORDER — MORPHINE SULFATE (PF) 2 MG/ML IV SOLN: 2.0000 mg | INTRAVENOUS | Status: DC | PRN

## 2017-10-01 MED ORDER — PANTOPRAZOLE SODIUM 40 MG PO TBEC
40.0000 mg | DELAYED_RELEASE_TABLET | Freq: Every day | ORAL | Status: DC
  Administered 2017-10-01 – 2017-10-06 (×5): 40 mg via ORAL
  Filled 2017-10-01 (×5): qty 1

## 2017-10-01 NOTE — Progress Notes (Signed)
Patient Demographics  [name], is a 57 y.o. female   MRN: [id number]   DOB - 06-13-1961  Admit Date - 09/30/2017    Outpatient Primary MD for the patient is Freddy Finner, NP  Consult requested in the Hospital by [name], MD, On 10/01/2017   With History of -  Past Medical History:  Diagnosis Date  . Anemia   . Anginal pain ([place])   . Anxiety   . Arthritis   . Asthma   . Broken wrist   . Bronchitis   . chronic diastolic CHF 7/79/3903  . COPD (chronic obstructive pulmonary disease) ([place])   . Coronary artery disease    a. cath 2013: stenting to RCA (report not available); b. cath 2014: LM nl, pLAD 40%, mLAD nl, ost LCx 40%, mid LCx nl, pRCA 30% @ site of prior stent, mRCA 50%  . Depression   . Diabetes mellitus without complication ([place])   . Diabetic neuropathy ([place])   . dialysis 2006  . Diverticulosis   . Dizziness   . Dyspnea   . Elevated lipids   . Environmental and seasonal allergies   . ESRD (end stage renal disease) on dialysis ([place])    M-W-F  . GERD (gastroesophageal reflux disease)   . Headache   . History of hiatal hernia   . HOH (hard of hearing)   . Hx of pancreatitis 2015  . Hypertension   . Lower extremity edema   . Mitral regurgitation    a. echo 10/2013: EF 62%, noWMA, mildly dilated LA, mild to mod MR/TR, GR1DD  . Myocardial infarction ([place])   . Orthopnea   . Pneumonia   . Renal cancer ([place])   . Renal insufficiency    Pt is on dialysis on M,W + F.  . Wheezing       Past Surgical History:  Procedure Laterality Date  . ABDOMINAL HYSTERECTOMY  1992  . APPENDECTOMY    . CARDIAC CATHETERIZATION Left 07/26/2015   Procedure: Left Heart Cath and Coronary Angiography;  Surgeon: Dionisio David, MD;  Location: [place] [facility];  Service: Cardiovascular;  Laterality:  Left;  . CATARACT EXTRACTION W/ INTRAOCULAR LENS IMPLANT Right   . CATARACT EXTRACTION W/PHACO Left 03/10/2017   Procedure: CATARACT EXTRACTION PHACO AND INTRAOCULAR LENS PLACEMENT (IOC);  Surgeon: Birder Robson, MD;  Location: [facility] ORS;  Service: Ophthalmology;  Laterality: Left;  [place] 00:51.9 AP% 14.2 CDE 7.39 fluid pack lot # [id number]  . CHOLECYSTECTOMY    . COLONOSCOPY WITH PROPOFOL N/A 08/12/2016   Procedure: COLONOSCOPY WITH PROPOFOL;  Surgeon: Lollie Sails, MD;  Location: [facility] ENDOSCOPY;  Service: Endoscopy;  Laterality: N/A;  . DIALYSIS FISTULA CREATION Left    upper arm  . dialysis grafts    . ESOPHAGOGASTRODUODENOSCOPY N/A 03/08/2015   Procedure: ESOPHAGOGASTRODUODENOSCOPY (EGD);  Surgeon: Manya Silvas, MD;  Location: [facility] ENDOSCOPY;  Service: Endoscopy;  Laterality: N/A;  . ESOPHAGOGASTRODUODENOSCOPY (EGD) WITH PROPOFOL N/A 03/18/2016   Procedure: ESOPHAGOGASTRODUODENOSCOPY (EGD) WITH PROPOFOL;  Surgeon: [name], MD;  Location: [facility] ENDOSCOPY;  Service: Endoscopy;  Laterality: N/A;  . EYE SURGERY Right 2018  . FECAL TRANSPLANT N/A 08/23/2015   Procedure: FECAL  TRANSPLANT;  Surgeon: Manya Silvas, MD;  Location: [facility] ENDOSCOPY;  Service: Endoscopy;  Laterality: N/A;  . HAND SURGERY Bilateral   . PERIPHERAL VASCULAR CATHETERIZATION N/A 12/20/2015   Procedure: Thrombectomy of dialysis access versus permcath placement;  Surgeon: Algernon Huxley, MD;  Location: [place] [facility];  Service: Cardiovascular;  Laterality: N/A;  . PERIPHERAL VASCULAR CATHETERIZATION N/A 12/20/2015   Procedure: A/V Shunt Intervention;  Surgeon: Algernon Huxley, MD;  Location: [place] [facility];  Service: Cardiovascular;  Laterality: N/A;  . PERIPHERAL VASCULAR CATHETERIZATION N/A 12/20/2015   Procedure: A/V Shuntogram/Fistulagram;  Surgeon: Algernon Huxley, MD;  Location: [place] [facility];  Service: Cardiovascular;  Laterality: N/A;  . PERIPHERAL VASCULAR CATHETERIZATION N/A 01/02/2016    Procedure: A/V Shuntogram/Fistulagram;  Surgeon: Algernon Huxley, MD;  Location: [place] [facility];  Service: Cardiovascular;  Laterality: N/A;  . PERIPHERAL VASCULAR CATHETERIZATION N/A 01/02/2016   Procedure: A/V Shunt Intervention;  Surgeon: Algernon Huxley, MD;  Location: [place] [facility];  Service: Cardiovascular;  Laterality: N/A;    in for   Chief Complaint  Patient presents with  . Osteomyelitis      HPI  [name]  is a 57 y.o. female, patient has a chronic ulceration on the left great toe with the toe tip.  She had chronic deformity and contracture of the toe leading to pressure on that region.  She has diabetes and diabetic neuropathy as well.  Recent x-rays have shown degenerative changes consistent with osteomyelitis to the left great toe.  She was admitted to the hospital with some increased pain and swelling to the left foot and leg.  She has had conservative treatments on the toe but it continued to stay open due to its structural deformity.    Review of Systems    In addition to the HPI above,  No Fever-chills, No Headache, No changes with Vision or hearing, No problems swallowing food or Liquids, No Chest pain, Cough or Shortness of Breath, No Abdominal pain, No Nausea or Vommitting, Bowel movements are regular, No Blood in stool or Urine, No dysuria, No new skin rashes or bruises, does have some erythema to the left lower leg No new joints pains-aches,  No new weakness, tingling, numbness in any extremity, No recent weight gain or loss, No polyuria, polydypsia or polyphagia, No significant Mental Stressors. Patient is on dialysis.  A full 10 point Review of Systems was done, except as stated above, all other Review of Systems were negative.   Social History Social History   Tobacco Use  . Smoking status: Former Smoker    Packs/day: 0.50    Years: 40.00    Pack years: 20.00    Types: Cigarettes    Last attempt to quit: 02/13/2015    Years since quitting:  2.6  . Smokeless tobacco: Never Used  Substance Use Topics  . Alcohol use: Yes    Alcohol/week: 0.6 oz    Types: 1 Glasses of wine per week    Comment: glass wine week per pt   Family History Family History  Problem Relation Age of Onset  . Kidney disease Mother   . Diabetes Mother   . Cancer Father   . Kidney disease Sister     Prior to Admission medications   Medication Sig Start Date End Date Taking? Authorizing Provider  albuterol (PROVENTIL HFA;VENTOLIN HFA) 108 (90 Base) MCG/ACT inhaler Inhale 2 puffs into the lungs every 6 (six) hours as needed for  shortness of breath.    Yes [provider]  albuterol (PROVENTIL) (2.5 MG/3ML) 0.083% nebulizer solution Take 2.5 mg by nebulization every 6 (six) hours as needed for wheezing or shortness of breath.   Yes [provider]  alum & mag hydroxide-simeth (MAALOX MAX) 400-400-40 MG/5ML suspension Take 5 mLs by mouth every 6 (six) hours as needed for indigestion. 06/09/17  Yes Veronese, [place], MD  amLODipine (NORVASC) 10 MG tablet Take 10 mg by mouth daily.   Yes [provider]  aspirin EC 81 MG tablet Take 81 mg by mouth daily. 09/08/16  Yes [provider]  atorvastatin (LIPITOR) 20 MG tablet Take 20 mg by mouth at bedtime.  12/02/16  Yes [provider]  carvedilol (COREG) 25 MG tablet Take 25 mg by mouth 2 (two) times daily with a meal.   Yes [provider]  cetirizine (ZYRTEC) 10 MG tablet Take 10 mg by mouth daily.    Yes [provider]  cholecalciferol (VITAMIN D) 400 units TABS tablet Take 400 Units by mouth daily.   Yes [provider]  ciprofloxacin (CIPRO) 500 MG tablet Take 500 mg by mouth 2 (two) times daily. 09/29/17 09/30/2017 Yes [provider]  clindamycin (CLEOCIN) 300 MG capsule Take 300 mg by mouth 2 (two) times daily. 09/29/17 10/17/2017 Yes [provider]  clopidogrel (PLAVIX) 75 MG tablet Take 1 tablet (75 mg total) by mouth daily.  03/25/17 03/25/18 Yes Dustin Flock, MD  cyanocobalamin 1000 MCG tablet Take 1,000 mcg by mouth daily with lunch.    Yes [provider]  dexlansoprazole (DEXILANT) 60 MG capsule Take 60 mg by mouth daily.   Yes [provider]  diphenhydrAMINE (BENADRYL) 25 mg capsule Take 25 mg by mouth every 6 (six) hours as needed for itching.   Yes [provider]  FLUoxetine (PROZAC) 20 MG capsule Take 1 capsule (20 mg total) by mouth daily. Patient taking differently: Take 20 mg by mouth at bedtime.  04/10/16  Yes Hower, Aaron Mose, MD  glucose 4 GM chewable tablet Chew 3-4 tablets by mouth as needed for low blood sugar (blood sugar less than 60).    Yes [provider]  HYDROcodone-acetaminophen (NORCO/VICODIN) 5-325 MG tablet Take 1 tablet by mouth every 6 (six) hours as needed for moderate pain or severe pain. Patient taking differently: Take 1 tablet by mouth 3 (three) times daily as needed for moderate pain or severe pain.  05/05/17  Yes Gouru, Illene Silver, MD  hydrOXYzine (ATARAX/VISTARIL) 25 MG tablet Take 25 mg by mouth every 6 (six) hours as needed.    Yes [provider]  insulin detemir (LEVEMIR) 100 UNIT/ML injection Inject 0.02 mLs (2 Units total) into the skin 2 (two) times daily. Patient taking differently: Inject 4 Units into the skin at bedtime.  09/05/17  Yes Epifanio Lesches, MD  levofloxacin (LEVAQUIN) 750 MG tablet Take 750 mg by mouth every Monday, Wednesday, and Friday.   Yes [provider]  lipase/protease/amylase (CREON) 12000 UNITS CPEP capsule Take 24,000-36,000 Units by mouth 3 (three) times daily with meals. Take 36000 mg in the morning, 24000 midday & 36000 in the evening   Yes [provider]  meclizine (ANTIVERT) 12.5 MG tablet Take 1 tablet by mouth three times daily as needed   Yes [provider]  metoCLOPramide (REGLAN) 10 MG tablet Take 1 tablet (10 mg total) by mouth every 8 (eight) hours as needed for nausea  or vomiting. Patient taking differently: Take  5 mg by mouth 4 (four) times daily -  with meals and at bedtime.  06/08/17  Yes Earleen Newport, MD  multivitamin (RENA-VIT) TABS tablet Take 1 tablet by mouth daily.    Yes [provider]  nitroGLYCERIN (NITROSTAT) 0.4 MG SL tablet Place 0.4 mg under the tongue every 5 (five) minutes x 3 doses as needed.   Yes [provider]  omega-3 acid ethyl esters (LOVAZA) 1 g capsule Take 1 g by mouth daily.    Yes [provider]  promethazine (PHENERGAN) 12.5 MG tablet Take 12.5 mg by mouth every 6 (six) hours as needed for nausea or vomiting.   Yes [provider]  ranitidine (ZANTAC) 150 MG tablet Take 150 mg by mouth 2 (two) times daily. In the afternoon   Yes [provider]  ranolazine (RANEXA) 500 MG 12 hr tablet Take 1,000 mg by mouth 2 (two) times daily.   Yes [provider]  rifaximin (XIFAXAN) 550 MG TABS tablet Take 550 mg by mouth 2 (two) times daily.   Yes [provider]  sevelamer carbonate (RENVELA) 800 MG tablet Take 800 mg by mouth 3 (three) times daily with meals.   Yes [provider]  sucralfate (CARAFATE) 1 g tablet Take 1 g by mouth 4 (four) times daily -  with meals and at bedtime.   Yes [provider]  famotidine (PEPCID) 20 MG tablet Take 1 tablet (20 mg total) by mouth 2 (two) times daily. Patient not taking: Reported on 09/30/2017 06/09/17 06/09/18  Rudene Re, MD    Anti-infectives (From admission, onward)   Start     Dose/Rate Route Frequency Ordered Stop   10/02/17 1200  vancomycin (VANCOCIN) IVPB 1000 mg/200 mL premix     1,000 mg 200 mL/hr over 60 Minutes Intravenous Every M-W-F (Hemodialysis) 09/30/17 1635     10/01/17 0200  piperacillin-tazobactam (ZOSYN) IVPB 3.375 g     3.375 g 12.5 mL/hr over 240 Minutes Intravenous Every 12 hours 09/30/17 1635     09/30/17 1730  vancomycin (VANCOCIN) 2,000 mg in sodium chloride 0.9 % 500 mL  IVPB     2,000 mg 250 mL/hr over 120 Minutes Intravenous  Once 09/30/17 1631 09/30/17 1937   09/30/17 1645  vancomycin (VANCOCIN) 2,000 mg in sodium chloride 0.9 % 500 mL IVPB  Status:  Discontinued     2,000 mg 250 mL/hr over 120 Minutes Intravenous  Once 09/30/17 1631 09/30/17 1631   09/30/17 1515  piperacillin-tazobactam (ZOSYN) IVPB 3.375 g     3.375 g 100 mL/hr over 30 Minutes Intravenous  Once 09/30/17 1507 09/30/17 1703   09/30/17 1515  vancomycin (VANCOCIN) IVPB 1000 mg/200 mL premix  Status:  Discontinued     1,000 mg 200 mL/hr over 60 Minutes Intravenous  Once 09/30/17 1507 09/30/17 1631      Scheduled Meds: . aspirin EC  81 mg Oral Daily  . atorvastatin  20 mg Oral QHS  . cholecalciferol  400 Units Oral Daily  . famotidine  20 mg Oral BID  . FLUoxetine  20 mg Oral QHS  . heparin  5,000 Units Subcutaneous Q8H  . insulin aspart  0-9 Units Subcutaneous TID WC  . cyanocobalamin  1,000 mcg Oral Q lunch   Continuous Infusions: . piperacillin-tazobactam (ZOSYN)  IV Stopped (10/01/17 0731)  . [START ON 10/02/2017] vancomycin     PRN Meds:.albuterol, alum & mag hydroxide-simeth, diphenhydrAMINE, docusate sodium, oxyCODONE-acetaminophen  Allergies  Allergen Reactions  . Ace Inhibitors  Swelling and Anaphylaxis  . Ativan [Lorazepam] Other (See Comments)    Reaction:Hallucinations and headaches Reaction:Hallucinations and headaches Reaction:  Hallucinations and headaches  . Compazine [Prochlorperazine Edisylate] Anaphylaxis and Nausea And Vomiting    Other reaction(s): dystonia from this vs. reglan 23 Jul - patient relates that she takes promethazine frequently with no problems. 23 Jul - patient relates that she takes promethazine frequently with no problems.  . Sumatriptan Succinate     Other reaction(s): delirium and hallucinations per [facility] records  . Dilaudid [Hydromorphone Hcl] Other (See Comments)    Delirium   . Ondansetron Other (See Comments)    hallucinations     . Zofran [Ondansetron Hcl] Other (See Comments)    Reaction:  hallucinations  Reaction:  hallucinations   . Codeine Nausea And Vomiting  . Gabapentin Other (See Comments)    Reaction:  Unknown    . Lac Bovis Nausea And Vomiting  . Losartan Nausea Only  . Oxycodone Anxiety  . Prochlorperazine Other (See Comments)    Reaction:  Unknown . Patient does not remember reaction but she does have vertigo and anxiety along with n and v at times. Could be used to treat any of these   . Reglan [Metoclopramide]     Per patient her Dr. Evelina Bucy her off it  Per patient her Dr. Evelina Bucy her off it   . Scopolamine Other (See Comments)    Dizziness, also has vertigo already  . Tape Rash  . Tapentadol Rash    Physical Exam  Vitals  Blood pressure (!) 132/46, pulse 64, temperature 98.8 F (37.1 C), temperature source Oral, resp. rate 18, height 5' 5.5" (1.664 m), weight 83.6 kg (184 lb 6.4 oz), SpO2 98 %.  Lower Extremity exam:  Vascular: DP pulses palpable at +2/4 on the left foot.  Posterior tibial little more difficult to palpate due to some swelling around the region.  Dermatological: Ulceration distal tip left hallux approximately 1.5-2 cm diameter probes down to bone in that region.  Some atrophy of the skin and nail due to chronic ulceration on that region as well.  Neurological: Diabetic peripheral neuropathy  Ortho: Hallux malleus with toe tip pressure with subsequent osteomyelitis to the distal phalanx.  Data Review  CBC Recent Labs  Lab 09/30/17 1555 10/01/17 0826  WBC 5.8 5.1  HGB 10.4* 9.8*  HCT 32.0* 30.8*  PLT 154 165  MCV 92.4 92.5  MCH 30.0 29.4  MCHC 32.4 31.8*  RDW 14.6* 14.9*  LYMPHSABS 1.8  --   MONOABS 0.8  --   EOSABS 0.4  --   BASOSABS 0.0  --    ------------------------------------------------------------------------------------------------------------------  Chemistries  Recent Labs  Lab 09/30/17 1555  NA 133*  K 3.9  CL 95*  CO2 28  GLUCOSE  191*  BUN 8  CREATININE 2.91*  CALCIUM 7.8*   --------------------------------------------------------------------------------  Assessment & Plan: Osteomyelitis to the left great toe distal phalanx.  This is a chronic situation and while she is in the hospital for address it.  Her circulation seems to be in pretty good shape as far as palpable pulse to the left foot.  She is a chronic deformity of the toe and chronic ulceration which is eventually led to this osteomyelitis.  X-ray showed demineralization of the.  This could be causing some of her overall pain and swelling to the foot and lower leg.  We will plan on taking her to the operating room tomorrow morning to do a distal amputation of the  left great toe distal phalanx area.  We will still leave a report of her toe on that digit.  I explained this to her today explained the consent form to her she understands except the risk possible complications of surgery as well as the possible risk of not doing the surgery.  Also going to proceed in order to try to resolve the issue on a long-term basis and she has had problems for a long time.  I put in orders for her to be n.p.o. after midnight tonight and she is scheduled for surgery tomorrow morning. Principal Problem:   Osteomyelitis [facility])     Family Communication: Plan discussed with patient    Albertine Patricia M.D on 10/01/2017 at 10:17 AM  Thank you for the consult, we will follow the patient with you in the Hospital.

## 2017-10-01 NOTE — Progress Notes (Signed)
[place] [place] NAME: [name]    MR#:  [id number]  DATE OF BIRTH:  01-21-1961  SUBJECTIVE:  CHIEF COMPLAINT:   Chief Complaint  Patient presents with  . Osteomyelitis     REVIEW OF SYSTEMS:  CONSTITUTIONAL: No fever, fatigue or weakness.  EYES: No blurred or double vision.  EARS, NOSE, AND THROAT: No tinnitus or ear pain.  RESPIRATORY: No cough, shortness of breath, wheezing or hemoptysis.  CARDIOVASCULAR: No chest pain, orthopnea, edema.  GASTROINTESTINAL: No nausea, vomiting, diarrhea or abdominal pain.  GENITOURINARY: No dysuria, hematuria.  ENDOCRINE: No polyuria, nocturia,  HEMATOLOGY: No anemia, easy bruising or bleeding SKIN: No rash or lesion. MUSCULOSKELETAL: No joint pain or arthritis.   NEUROLOGIC: No tingling, numbness, weakness.  PSYCHIATRY: No anxiety or depression.   ROS  DRUG ALLERGIES:   Allergies  Allergen Reactions  . Ace Inhibitors Swelling and Anaphylaxis  . Ativan [Lorazepam] Other (See Comments)    Reaction:Hallucinations and headaches Reaction:Hallucinations and headaches Reaction:  Hallucinations and headaches  . Compazine [Prochlorperazine Edisylate] Anaphylaxis and Nausea And Vomiting    Other reaction(s): dystonia from this vs. reglan 23 Jul - patient relates that she takes promethazine frequently with no problems. 23 Jul - patient relates that she takes promethazine frequently with no problems.  . Sumatriptan Succinate     Other reaction(s): delirium and hallucinations per [facility] records  . Dilaudid [Hydromorphone Hcl] Other (See Comments)    Delirium   . Ondansetron Other (See Comments)    hallucinations    . Zofran [Ondansetron Hcl] Other (See Comments)    Reaction:  hallucinations  Reaction:  hallucinations   . Codeine Nausea And Vomiting  . Gabapentin Other (See Comments)    Reaction:  Unknown    . Lac Bovis Nausea And Vomiting  . Losartan Nausea Only  . Oxycodone Anxiety  .  Prochlorperazine Other (See Comments)    Reaction:  Unknown . Patient does not remember reaction but she does have vertigo and anxiety along with n and v at times. Could be used to treat any of these   . Reglan [Metoclopramide]     Per patient her Dr. Evelina Bucy her off it  Per patient her Dr. Evelina Bucy her off it   . Scopolamine Other (See Comments)    Dizziness, also has vertigo already  . Tape Rash  . Tapentadol Rash    VITALS:  Blood pressure (!) 132/46, pulse 64, temperature 98.8 F (37.1 C), temperature source Oral, resp. rate 18, height 5' 5.5" (1.664 m), weight 83.6 kg (184 lb 6.4 oz), SpO2 98 %.  PHYSICAL EXAMINATION:  GENERAL:  57 y.o.-year-old patient lying in the bed with no acute distress.  EYES: Pupils equal, round, reactive to light and accommodation. No scleral icterus. Extraocular muscles intact.  HEENT: Head atraumatic, normocephalic. Oropharynx and nasopharynx clear.  NECK:  Supple, no jugular venous distention. No thyroid enlargement, no tenderness.  LUNGS: Normal breath sounds bilaterally, no wheezing, rales,rhonchi or crepitation. No use of accessory muscles of respiration.  CARDIOVASCULAR: S1, S2 normal. No murmurs, rubs, or gallops.  ABDOMEN: Soft, nontender, nondistended. Bowel sounds present. No organomegaly or mass.  EXTREMITIES: No pedal edema, cyanosis, or clubbing.  NEUROLOGIC: Cranial nerves II through XII are intact. Muscle strength 5/5 in all extremities. Sensation intact. Gait not checked.  PSYCHIATRIC: The patient is alert and oriented x 3.  SKIN: No obvious rash, lesion, or ulcer.   Physical Exam LABORATORY PANEL:   CBC  Recent Labs  Lab 10/01/17 0826  WBC 5.1  HGB 9.8*  HCT 30.8*  PLT 165   ------------------------------------------------------------------------------------------------------------------  Chemistries  Recent Labs  Lab 10/01/17 0826  NA 135  K 4.3  CL 96*  CO2 28  GLUCOSE 101*  BUN 10  CREATININE 4.32*  CALCIUM 7.7*    ------------------------------------------------------------------------------------------------------------------  Cardiac Enzymes No results for input(s): TROPONINI in the last 168 hours. ------------------------------------------------------------------------------------------------------------------  RADIOLOGY:  Dg Knee Complete 4 Views Right  Result Date: 09/30/2017 CLINICAL DATA:  Fall, injury to right knee EXAM: RIGHT KNEE - COMPLETE 4+ VIEW COMPARISON:  01/06/2012 FINDINGS: No acute bony abnormality. Specifically, no fracture, subluxation, or dislocation. No joint effusion. Joint spaces maintained. Vascular calcifications noted. IMPRESSION: No acute bony abnormality. Electronically Signed   By: Rolm Baptise M.D.   On: 09/30/2017 15:27    ASSESSMENT AND PLAN:    1 acute left great toe osteomyelitis Stable Continue empiricIV vanc+ Zosyn, follow-up on cultures, for amputation in the morning by podiatry, Plavix held for surgery  2 chronic diabetes mellitus type 2  Stable on current regiment   3 chronic benign essential hypertension  Stable  Blood pressure medication on hold on admission for relative hypotension, vitals per routine, make changes as per necessary   4 chronic ESRD On hemodialysis MWF For dialysis on tomorrow  5 history of CAD Stable Continue to hold plavix given surgery in the morning   6 acute right knee pain  Stable  X-rays negative for acute fracture  Cannot rule out possible ligamentous injury    All the records are reviewed and case discussed with Care Management/Social Workerr. Management plans discussed with the patient, family and they are in agreement.  CODE STATUS: full  TOTAL TIME TAKING CARE OF THIS PATIENT: 40 minutes.     POSSIBLE D/C IN 3-4 DAYS, DEPENDING ON CLINICAL CONDITION.   Avel Peace Lindey Renzulli M.D on 10/01/2017   Between 7am to 6pm - Pager - [phone]  After 6pm go to [email or web address] - password [email or web address]  [facility]  Office  [phone]  CC: Primary care physician; Freddy Finner, NP  Note: This dictation was prepared with Dragon dictation along with smaller phrase technology. Any transcriptional errors that result from this process are unintentional.

## 2017-10-01 NOTE — Progress Notes (Addendum)
CBG 61. Pt was given regular cola to drink. Will recheck in 15 minutes.

## 2017-10-01 NOTE — Progress Notes (Signed)
[facility]  ROUNDING NOTE   Subjective:   Last hemodialysis on Wednesday.   [name] admitted to [facility] on 09/30/2017 for End-stage renal disease on hemodialysis ([place]) [N18.6, Z99.2] Acute osteomyelitis of toe of left foot (HCC) [M86.172] Acute pain of right knee [M25.561]  Objective:  Vital signs in last 24 hours:  Temp:  [97.8 F (36.6 C)-98.8 F (37.1 C)] 98.8 F (37.1 C) (01/10 0801) Pulse Rate:  [60-66] 64 (01/10 0801) Resp:  [18-20] 18 (01/10 0801) BP: (111-141)/(39-49) 132/46 (01/10 0801) SpO2:  [98 %-100 %] 98 % (01/10 0801) Weight:  [83.6 kg (184 lb 6.4 oz)] 83.6 kg (184 lb 6.4 oz) (01/09 1453)  Weight change:  Filed Weights   09/30/17 1453  Weight: 83.6 kg (184 lb 6.4 oz)    Intake/Output: No intake/output data recorded.   Intake/Output this shift:  Total I/O In: 360 [P.O.:360] Out: -   Physical Exam: General: NAD,   Head: Normocephalic, atraumatic. Moist oral mucosal membranes  Eyes: Anicteric, PERRL  Neck: Supple, trachea midline  Lungs:  Clear to auscultation  Heart: Regular rate and rhythm  Abdomen:  Soft, nontender,   Extremities: no peripheral edema. Left foot in clean dressings  Neurologic: Nonfocal, moving all four extremities  Skin: No lesions  Access: Right AVG    Basic Metabolic Panel: Recent Labs  Lab 09/30/17 1555 10/01/17 0826  NA 133* 135  K 3.9 4.3  CL 95* 96*  CO2 28 28  GLUCOSE 191* 101*  BUN 8 10  CREATININE 2.91* 4.32*  CALCIUM 7.8* 7.7*    Liver Function Tests: No results for input(s): AST, ALT, ALKPHOS, BILITOT, PROT, ALBUMIN in the last 168 hours. No results for input(s): LIPASE, AMYLASE in the last 168 hours. No results for input(s): AMMONIA in the last 168 hours.  CBC: Recent Labs  Lab 09/30/17 1555 10/01/17 0826  WBC 5.8 5.1  NEUTROABS 2.8  --   HGB 10.4* 9.8*  HCT 32.0* 30.8*  MCV 92.4 92.5  PLT 154 165    Cardiac Enzymes: No results for input(s): CKTOTAL, CKMB, CKMBINDEX,  TROPONINI in the last 168 hours.  BNP: Invalid input(s): POCBNP  CBG: Recent Labs  Lab 09/30/17 1738 09/30/17 2111 10/01/17 0754 10/01/17 1138  GLUCAP 147* 101* 93 176*    Microbiology: Results for orders placed or performed during the hospital encounter of 09/30/17  Wound or Superficial Culture     Status: None (Preliminary result)   Collection Time: 09/30/17  3:10 PM  Result Value Ref Range Status   Specimen Description   Final    TOE LEFT Performed at [facility], [address] [place], [place] [address]    Special Requests   Final    NONE Performed at [facility], [address] [place], [place] [address]    Gram Stain   Final    RARE WBC PRESENT, PREDOMINANTLY MONONUCLEAR RARE GRAM POSITIVE COCCI    Culture   Final    CULTURE REINCUBATED FOR BETTER GROWTH Performed at [facility], [place] [address] [place], [place] [address]    Report Status PENDING  Incomplete  MRSA PCR Screening     Status: None   Collection Time: 09/30/17  7:55 PM  Result Value Ref Range Status   MRSA by PCR NEGATIVE NEGATIVE Final    Comment:        The GeneXpert MRSA Assay (FDA approved for NASAL specimens only), is one component of a comprehensive MRSA colonization  surveillance program. It is not intended to diagnose MRSA infection nor to guide or monitor treatment for MRSA infections. Performed at [facility], [place] [place], [place] [address]     Coagulation Studies: Recent Labs    09/30/17 1555  LABPROT 12.4  INR 0.93    Urinalysis: No results for input(s): COLORURINE, LABSPEC, PHURINE, GLUCOSEU, HGBUR, BILIRUBINUR, KETONESUR, PROTEINUR, UROBILINOGEN, NITRITE, LEUKOCYTESUR in the last 72 hours.  Invalid input(s): APPERANCEUR    Imaging: Dg Knee Complete 4 Views Right  Result Date: 09/30/2017 CLINICAL DATA:  Fall, injury to right knee EXAM: RIGHT KNEE - COMPLETE 4+ VIEW COMPARISON:  01/06/2012 FINDINGS: No acute bony  abnormality. Specifically, no fracture, subluxation, or dislocation. No joint effusion. Joint spaces maintained. Vascular calcifications noted. IMPRESSION: No acute bony abnormality. Electronically Signed   By: Rolm Baptise M.D.   On: 09/30/2017 15:27   Dg Toe Great Left  Result Date: 09/30/2017 CLINICAL DATA:  Diabetic ulcer of left great toe. EXAM: LEFT GREAT TOE-3 VIEW COMPARISON:  None. FINDINGS: Soft tissue swelling and ulcer is seen overlying the distal phalanx. Osteolysis is seen involving the tuft of the distal phalanx, consistent with osteomyelitis. No other bone lesions identified. IMPRESSION: Osteomyelitis involving tuft of distal phalanx of great toe. Electronically Signed   By: Earle Gell M.D.   On: 09/30/2017 08:18     Medications:   . piperacillin-tazobactam (ZOSYN)  IV Stopped (10/01/17 0731)  . [START ON 10/02/2017] vancomycin     . aspirin EC  81 mg Oral Daily  . atorvastatin  20 mg Oral QHS  . cholecalciferol  400 Units Oral Daily  . famotidine  20 mg Oral BID  . FLUoxetine  20 mg Oral QHS  . heparin  5,000 Units Subcutaneous Q8H  . insulin aspart  0-9 Units Subcutaneous TID WC  . lipase/protease/amylase  36,000 Units Oral TID WC  . metoCLOPramide  5 mg Oral TID WC & HS  . multivitamin  1 tablet Oral Daily  . omega-3 acid ethyl esters  1 g Oral Daily  . pantoprazole  40 mg Oral Daily  . ranolazine  1,000 mg Oral BID  . rifaximin  550 mg Oral BID  . sevelamer carbonate  800 mg Oral TID WC  . sucralfate  1 g Oral TID WC & HS  . cyanocobalamin  1,000 mcg Oral Q lunch   albuterol, alum & mag hydroxide-simeth, diphenhydrAMINE, docusate sodium, HYDROcodone-acetaminophen, hydrOXYzine, meclizine, nitroGLYCERIN, oxyCODONE-acetaminophen, promethazine  Assessment/ Plan:  [name] is a 57 y.o. black female with diabetes, hypertension, hyperlipidemia, diabetic retinopathy, diabetic neuropathy, diabetic gastroparesis, pancreatic insufficiency, depression, GERD. Renal  masses, M-spike   MWF [facility] [place], Right AVG  1. End Stage Renal Disease: hemodialysis MWF - treatment scheduled for tomorrow.   2. Hypertension: history of difficult to control. - amlodipine and carvedilol are her outpatient medications.   3. Anemia of chronic kidney disease: hemoglobin 9.8 - Mircera as outpatient.   4. Secondary Hyperparathyroidism with hyperphosphatemia: with hypocalcemia on admission - Continue sevelamer    LOS: 1 Jayde Mcallister 1/10/20191:58 PM

## 2017-10-01 NOTE — Anesthesia Preprocedure Evaluation (Addendum)
Anesthesia Evaluation    Airway Mallampati: II       Dental  (+) Caps   Pulmonary shortness of breath, asthma , pneumonia, COPD, former smoker,    Pulmonary exam normal        Cardiovascular hypertension, + angina + CAD, + Past MI, + Peripheral Vascular Disease, +CHF and + Orthopnea  Normal cardiovascular exam     Neuro/Psych  Headaches, PSYCHIATRIC DISORDERS Anxiety Depression TIA   GI/Hepatic hiatal hernia, GERD  ,  Endo/Other  diabetes  Renal/GU Renal disease     Musculoskeletal  (+) Arthritis ,   Abdominal Normal abdominal exam  (+)   Peds  Hematology  (+) anemia ,   Anesthesia Other Findings Past Medical History: No date: Anemia No date: Anginal pain ([place]) No date: Anxiety No date: Arthritis No date: Asthma No date: Broken wrist No date: Bronchitis 11/13/9796: chronic diastolic CHF No date: COPD (chronic obstructive pulmonary disease) (HCC) No date: Coronary artery disease     Comment:  a. cath 2013: stenting to RCA (report not available); b.              cath 2014: LM nl, pLAD 40%, mLAD nl, ost LCx 40%, mid LCx              nl, pRCA 30% @ site of prior stent, mRCA 50% No date: Depression No date: Diabetes mellitus without complication ([place]) No date: Diabetic neuropathy ([place]) 2006: dialysis No date: Diverticulosis No date: Dizziness No date: Dyspnea No date: Elevated lipids No date: Environmental and seasonal allergies No date: ESRD (end stage renal disease) on dialysis ([place])     Comment:  M-W-F No date: GERD (gastroesophageal reflux disease) No date: Headache No date: History of hiatal hernia No date: HOH (hard of hearing) 56: Hx of pancreatitis No date: Hypertension No date: Lower extremity edema No date: Mitral regurgitation     Comment:  a. echo 10/2013: EF 62%, noWMA, mildly dilated LA, mild               to mod MR/TR, GR1DD No date: Myocardial infarction ([place]) No date: Orthopnea No  date: Pneumonia No date: Renal cancer ([place]) No date: Renal insufficiency     Comment:  Pt is on dialysis on M,W + F. No date: Wheezing   Reproductive/Obstetrics                                                             Anesthesia Evaluation  Patient identified by MRN, date of birth, ID band Patient awake    Reviewed: Allergy & Precautions, NPO status , Patient's Chart, lab work & pertinent test results  Airway Mallampati: II       Dental  (+) Upper Dentures, Lower Dentures   Pulmonary shortness of breath and with exertion, asthma , COPD, former smoker,     + decreased breath sounds      Cardiovascular Exercise Tolerance: Poor hypertension, + angina with exertion + CAD, + Past MI, + Peripheral Vascular Disease, +CHF and + Orthopnea   Rhythm:Regular     Neuro/Psych  Headaches, PSYCHIATRIC DISORDERS Anxiety Depression TIA   GI/Hepatic Neg liver ROS, hiatal hernia, GERD  Medicated,  Endo/Other  diabetes, Type 2  Renal/GU DialysisRenal disease     Musculoskeletal  (+) Arthritis ,  Osteoarthritis,    Abdominal Normal abdominal exam  (+)   Peds  Hematology  (+) anemia ,   Anesthesia Other Findings   Reproductive/Obstetrics                             Anesthesia Physical  Anesthesia Plan  ASA: IV  Anesthesia Plan: MAC   Post-op Pain Management:    Induction: Intravenous  PONV Risk Score and Plan:   Airway Management Planned: Nasal Cannula  Additional Equipment:   Intra-op Plan:   Post-operative Plan:   Informed Consent: I have reviewed the patients History and Physical, chart, labs and discussed the procedure including the risks, benefits and alternatives for the proposed anesthesia with the patient or authorized representative who has indicated his/her understanding and acceptance.     Plan Discussed with: CRNA  Anesthesia Plan Comments:         Anesthesia Quick  Evaluation  Anesthesia Physical Anesthesia Plan  ASA: IV  Anesthesia Plan: MAC and General   Post-op Pain Management:    Induction: Intravenous  PONV Risk Score and Plan:   Airway Management Planned: Nasal Cannula  Additional Equipment:   Intra-op Plan:   Post-operative Plan:   Informed Consent:   Dental advisory given  Plan Discussed with: CRNA and Surgeon  Anesthesia Plan Comments:         Anesthesia Quick Evaluation

## 2017-10-02 ENCOUNTER — Encounter
Admission: EM | Disposition: A | Discharge status: Home Health | Payer: Self-pay | Source: Home / Self Care | Attending: Family Medicine

## 2017-10-02 ENCOUNTER — Inpatient Hospital Stay: Payer: Medicare Other | Admitting: Anesthesiology

## 2017-10-02 ENCOUNTER — Encounter: Payer: Self-pay | Admitting: Podiatry

## 2017-10-02 HISTORY — PX: AMPUTATION TOE: SHX6595

## 2017-10-02 LAB — GLUCOSE, CAPILLARY
Glucose-Capillary: 127 mg/dL — ABNORMAL HIGH (ref 65–99)
Glucose-Capillary: 121 mg/dL — ABNORMAL HIGH (ref 65–99)
Glucose-Capillary: 154 mg/dL — ABNORMAL HIGH (ref 65–99)
Glucose-Capillary: 128 mg/dL — ABNORMAL HIGH (ref 65–99)
Glucose-Capillary: 214 mg/dL — ABNORMAL HIGH (ref 65–99)

## 2017-10-02 SURGERY — AMPUTATION, TOE
Anesthesia: Monitor Anesthesia Care | Site: Toe | Laterality: Left | Wound class: Dirty or Infected

## 2017-10-02 MED ORDER — MIDAZOLAM HCL 2 MG/2ML IJ SOLN
INTRAMUSCULAR | Status: AC
  Filled 2017-10-02: qty 2

## 2017-10-02 MED ORDER — PROPOFOL 10 MG/ML IV BOLUS
INTRAVENOUS | Status: AC
  Filled 2017-10-02: qty 20

## 2017-10-02 MED ORDER — LACTATED RINGERS IV SOLN
INTRAVENOUS | Status: DC | PRN
  Administered 2017-10-02: 08:00:00 via INTRAVENOUS

## 2017-10-02 MED ORDER — FENTANYL CITRATE (PF) 100 MCG/2ML IJ SOLN
INTRAMUSCULAR | Status: AC
  Filled 2017-10-02: qty 2

## 2017-10-02 MED ORDER — FENTANYL CITRATE (PF) 100 MCG/2ML IJ SOLN
INTRAMUSCULAR | Status: DC | PRN
  Administered 2017-10-02: 50 ug via INTRAVENOUS

## 2017-10-02 MED ORDER — MIDAZOLAM HCL 2 MG/2ML IJ SOLN
INTRAMUSCULAR | Status: DC | PRN
  Administered 2017-10-02: 1 mg via INTRAVENOUS

## 2017-10-02 MED ORDER — DIPHENHYDRAMINE HCL 50 MG/ML IJ SOLN
INTRAMUSCULAR | Status: DC | PRN
  Administered 2017-10-02: 12.5 mg via INTRAVENOUS

## 2017-10-02 MED ORDER — FENTANYL CITRATE (PF) 100 MCG/2ML IJ SOLN: 25.0000 ug | INTRAMUSCULAR | Status: DC | PRN

## 2017-10-02 MED ORDER — PROPOFOL 10 MG/ML IV BOLUS
INTRAVENOUS | Status: DC | PRN
  Administered 2017-10-02: 30 mg via INTRAVENOUS

## 2017-10-02 MED ORDER — ONDANSETRON HCL 4 MG/2ML IJ SOLN: 4.0000 mg | Freq: Once | INTRAMUSCULAR | Status: DC | PRN

## 2017-10-02 MED ORDER — BUPIVACAINE HCL 0.5 % IJ SOLN
INTRAMUSCULAR | Status: DC | PRN
  Administered 2017-10-02: 4 mL

## 2017-10-02 SURGICAL SUPPLY — 35 items
BANDAGE ELASTIC 4 LF NS (GAUZE/BANDAGES/DRESSINGS) ×2 IMPLANT
BANDAGE STRETCH 3X4.1 STRL (GAUZE/BANDAGES/DRESSINGS) ×2 IMPLANT
BLADE MED AGGRESSIVE (BLADE) ×2 IMPLANT
BLADE SURG 15 STRL LF DISP TIS (BLADE) ×4 IMPLANT
BLADE SURG 15 STRL SS (BLADE) ×4
BNDG ESMARK 4X12 TAN STRL LF (GAUZE/BANDAGES/DRESSINGS) ×2 IMPLANT
BNDG GAUZE 4.5X4.1 6PLY STRL (MISCELLANEOUS) ×2 IMPLANT
CANISTER SUCT 1200ML W/VALVE (MISCELLANEOUS) ×2 IMPLANT
CUFF TOURN 18 STER (MISCELLANEOUS) ×2 IMPLANT
CUFF TOURN DUAL PL 12 NO SLV (MISCELLANEOUS) ×2 IMPLANT
DRAPE FLUOR MINI C-ARM 54X84 (DRAPES) ×2 IMPLANT
DURAPREP 26ML APPLICATOR (WOUND CARE) ×2 IMPLANT
ELECT REM PT RETURN 9FT ADLT (ELECTROSURGICAL) ×2
ELECTRODE REM PT RTRN 9FT ADLT (ELECTROSURGICAL) ×1 IMPLANT
GAUZE PETRO XEROFOAM 1X8 (MISCELLANEOUS) ×2 IMPLANT
GAUZE SPONGE 4X4 12PLY STRL (GAUZE/BANDAGES/DRESSINGS) ×2 IMPLANT
GLOVE BIO SURGEON STRL SZ8 (GLOVE) ×2 IMPLANT
GLOVE INDICATOR 8.0 STRL GRN (GLOVE) ×2 IMPLANT
GOWN STRL REUS W/ TWL LRG LVL3 (GOWN DISPOSABLE) ×2 IMPLANT
GOWN STRL REUS W/TWL LRG LVL3 (GOWN DISPOSABLE) ×2
KIT RM TURNOVER STRD PROC AR (KITS) ×2 IMPLANT
LABEL OR SOLS (LABEL) ×2 IMPLANT
NDL SAFETY ECLIPSE 18X1.5 (NEEDLE) ×1 IMPLANT
NEEDLE HYPO 18GX1.5 SHARP (NEEDLE) ×1
NEEDLE HYPO 25X1 1.5 SAFETY (NEEDLE) ×6 IMPLANT
NS IRRIG 500ML POUR BTL (IV SOLUTION) ×2 IMPLANT
PACK EXTREMITY ARMC (MISCELLANEOUS) ×2 IMPLANT
PENCIL ELECTRO HAND CTR (MISCELLANEOUS) ×2 IMPLANT
RASP SM TEAR CROSS CUT (RASP) ×2 IMPLANT
STOCKINETTE STRL 6IN 960660 (GAUZE/BANDAGES/DRESSINGS) ×2 IMPLANT
SUT ETH BLK MONO 3 0 FS 1 12/B (SUTURE) ×2 IMPLANT
SUT ETHILON 5 0 PS 2 18 (SUTURE) ×2 IMPLANT
SUT VIC AB 4-0 FS2 27 (SUTURE) ×2 IMPLANT
SWAB CULTURE AMIES ANAERIB BLU (MISCELLANEOUS) ×2 IMPLANT
SYR 10ML LL (SYRINGE) ×2 IMPLANT

## 2017-10-02 NOTE — Anesthesia Postprocedure Evaluation (Signed)
Anesthesia Post Note  Patient: [name]  Procedure(s) Performed: AMPUTATION TOE-LEFT GREAT TOE (Left Toe)  Patient location during evaluation: PACU Anesthesia Type: General Level of consciousness: awake and alert and oriented Pain management: pain level controlled Vital Signs Assessment: post-procedure vital signs reviewed and stable Respiratory status: spontaneous breathing Cardiovascular status: blood pressure returned to baseline Anesthetic complications: no     Last Vitals:  Vitals:   10/02/17 1630 10/02/17 1645  BP: (!) 155/55 (!) 159/66  Pulse: 69 68  Resp: 14 12  Temp:    SpO2: 97% 98%    Last Pain:  Vitals:   10/02/17 1415  TempSrc: Oral  PainSc:                  Keeven Matty

## 2017-10-02 NOTE — Transfer of Care (Signed)
Immediate Anesthesia Transfer of Care Note  Patient: [name]  Procedure(s) Performed: AMPUTATION TOE-LEFT GREAT TOE (Left Toe)  Patient Location: PACU  Anesthesia Type:MAC  Level of Consciousness: awake, alert  and oriented  Airway & Oxygen Therapy: Patient Spontanous Breathing and Patient connected to nasal cannula oxygen  Post-op Assessment: Report given to RN  Post vital signs: Reviewed  Last Vitals:  Vitals:   10/02/17 0858 10/02/17 0915  BP: (!) 147/53 (!) 156/51  Pulse: 64 66  Resp: 12   Temp:    SpO2: 100% 98%    Last Pain:  Vitals:   10/02/17 0858  TempSrc:   PainSc: 0-No pain      Patients Stated Pain Goal: 0 (40/37/54 3606)  Complications: No apparent anesthesia complications

## 2017-10-02 NOTE — Progress Notes (Signed)
ANTIBIOTIC CONSULT NOTE - INITIAL  Pharmacy Consult for Vancomycin and Zosyn Indication: osteomyelitis   Allergies  Allergen Reactions  . Ace Inhibitors Swelling and Anaphylaxis  . Ativan [Lorazepam] Other (See Comments)    Reaction:Hallucinations and headaches Reaction:Hallucinations and headaches Reaction:  Hallucinations and headaches  . Compazine [Prochlorperazine Edisylate] Anaphylaxis and Nausea And Vomiting    Other reaction(s): dystonia from this vs. reglan 23 Jul - patient relates that she takes promethazine frequently with no problems. 23 Jul - patient relates that she takes promethazine frequently with no problems.  . Sumatriptan Succinate     Other reaction(s): delirium and hallucinations per [facility] records  . Dilaudid [Hydromorphone Hcl] Other (See Comments)    Delirium   . Ondansetron Other (See Comments)    hallucinations    . Zofran [Ondansetron Hcl] Other (See Comments)    Reaction:  hallucinations  Reaction:  hallucinations   . Codeine Nausea And Vomiting  . Gabapentin Other (See Comments)    Reaction:  Unknown    . [place] Nausea And Vomiting  . Losartan Nausea Only  . Oxycodone Anxiety  . Prochlorperazine Other (See Comments)    Reaction:  Unknown . Patient does not remember reaction but she does have vertigo and anxiety along with n and v at times. Could be used to treat any of these   . Reglan [Metoclopramide]     Per patient her Dr. Evelina Bucy her off it  Per patient her Dr. Evelina Bucy her off it   . Scopolamine Other (See Comments)    Dizziness, also has vertigo already  . Tape Rash  . Tapentadol Rash    Patient Measurements: Height: 5' 5.5" (166.4 cm) Weight: 184 lb 6.4 oz (83.6 kg) IBW/kg (Calculated) : 58.15 Adjusted Body Weight:   Vital Signs: Temp: 98 F (36.7 C) (01/11 0828) Temp Source: Oral (01/11 0512) BP: 156/51 (01/11 0915) Pulse Rate: 66 (01/11 0915) Intake/Output from previous day: 01/10 0701 - 01/11 0700 In: 360  [P.O.:360] Out: -  Intake/Output from this shift: Total I/O In: 50 [I.V.:50] Out: 5 [Blood:5]  Labs: Recent Labs    09/30/17 1555 10/01/17 0826  WBC 5.8 5.1  HGB 10.4* 9.8*  PLT 154 165  CREATININE 2.91* 4.32*   Estimated Creatinine Clearance: 15.3 mL/min (A) (by C-G formula based on SCr of 4.32 mg/dL (H)). No results for input(s): VANCOTROUGH, VANCOPEAK, VANCORANDOM, GENTTROUGH, GENTPEAK, GENTRANDOM, TOBRATROUGH, TOBRAPEAK, TOBRARND, AMIKACINPEAK, AMIKACINTROU, AMIKACIN in the last 72 hours.   Microbiology: Recent Results (from the past 720 hour(s))  Wound or Superficial Culture     Status: None (Preliminary result)   Collection Time: 09/30/17  3:10 PM  Result Value Ref Range Status   Specimen Description   Final    TOE LEFT Performed at [facility], [address], [place], [place] [address]    Special Requests   Final    NONE Performed at [facility], [place] [employer] [place], [place] [address]    Gram Stain   Final    RARE WBC PRESENT, PREDOMINANTLY MONONUCLEAR RARE GRAM POSITIVE COCCI    Culture   Final    CULTURE REINCUBATED FOR BETTER GROWTH Performed at [facility], [place] [address], [place], [place] [address]    Report Status PENDING  Incomplete  MRSA PCR Screening     Status: None   Collection Time: 09/30/17  7:55 PM  Result Value Ref Range Status   MRSA by PCR NEGATIVE NEGATIVE Final    Comment:  The GeneXpert MRSA Assay (FDA approved for NASAL specimens only), is one component of a comprehensive MRSA colonization surveillance program. It is not intended to diagnose MRSA infection nor to guide or monitor treatment for MRSA infections. Performed at [facility], [address], [place], [place] [address]     Medical History: Past Medical History:  Diagnosis Date  . Anemia   . Anginal pain ([place])   . Anxiety   . Arthritis   . Asthma   . Broken wrist   . Bronchitis   . chronic diastolic CHF  9/37/9024  . COPD (chronic obstructive pulmonary disease) ([place])   . Coronary artery disease    a. cath 2013: stenting to RCA (report not available); b. cath 2014: LM nl, pLAD 40%, mLAD nl, ost LCx 40%, mid LCx nl, pRCA 30% @ site of prior stent, mRCA 50%  . Depression   . Diabetes mellitus without complication ([place])   . Diabetic neuropathy ([place])   . dialysis 2006  . Diverticulosis   . Dizziness   . Dyspnea   . Elevated lipids   . Environmental and seasonal allergies   . ESRD (end stage renal disease) on dialysis ([place])    M-W-F  . GERD (gastroesophageal reflux disease)   . Headache   . History of hiatal hernia   . HOH (hard of hearing)   . Hx of pancreatitis 2015  . Hypertension   . Lower extremity edema   . Mitral regurgitation    a. echo 10/2013: EF 62%, noWMA, mildly dilated LA, mild to mod MR/TR, GR1DD  . Myocardial infarction ([place])   . Orthopnea   . Pneumonia   . Renal cancer ([place])   . Renal insufficiency    Pt is on dialysis on M,W + F.  . Wheezing     Medications:  Medications Prior to Admission  Medication Sig Dispense Refill Last Dose  . albuterol (PROVENTIL HFA;VENTOLIN HFA) 108 (90 Base) MCG/ACT inhaler Inhale 2 puffs into the lungs every 6 (six) hours as needed for shortness of breath.    PRN at PRN  . albuterol (PROVENTIL) (2.5 MG/3ML) 0.083% nebulizer solution Take 2.5 mg by nebulization every 6 (six) hours as needed for wheezing or shortness of breath.   PRN at PRN  . alum & mag hydroxide-simeth (MAALOX MAX) 400-400-40 MG/5ML suspension Take 5 mLs by mouth every 6 (six) hours as needed for indigestion. 355 mL 0 PRN at PRN  . amLODipine (NORVASC) 10 MG tablet Take 10 mg by mouth daily.   09/30/2017 at AM  . aspirin EC 81 MG tablet Take 81 mg by mouth daily.   09/30/2017 at AM  . atorvastatin (LIPITOR) 20 MG tablet Take 20 mg by mouth at bedtime.    09/29/2017 at PM  . carvedilol (COREG) 25 MG tablet Take 25 mg by mouth 2 (two) times daily with a meal.   09/30/2017 at AM   . cetirizine (ZYRTEC) 10 MG tablet Take 10 mg by mouth daily.    09/30/2017 at AM  . cholecalciferol (VITAMIN D) 400 units TABS tablet Take 400 Units by mouth daily.   09/30/2017 at AM  . ciprofloxacin (CIPRO) 500 MG tablet Take 500 mg by mouth 2 (two) times daily.   N/A at N/A  . clindamycin (CLEOCIN) 300 MG capsule Take 300 mg by mouth 2 (two) times daily.   N/A at N/A  . clopidogrel (PLAVIX) 75 MG tablet Take 1 tablet (75 mg total) by mouth daily. 30 tablet  2 09/30/2017 at AM  . cyanocobalamin 1000 MCG tablet Take 1,000 mcg by mouth daily with lunch.    09/30/2017 at AM  . dexlansoprazole (DEXILANT) 60 MG capsule Take 60 mg by mouth daily.   09/30/2017 at AM  . diphenhydrAMINE (BENADRYL) 25 mg capsule Take 25 mg by mouth every 6 (six) hours as needed for itching.   PRN at PRN  . FLUoxetine (PROZAC) 20 MG capsule Take 1 capsule (20 mg total) by mouth daily. (Patient taking differently: Take 20 mg by mouth at bedtime. ) 30 capsule 0 09/29/2017 at HS  . glucose 4 GM chewable tablet Chew 3-4 tablets by mouth as needed for low blood sugar (blood sugar less than 60).    PRN at PRN  . HYDROcodone-acetaminophen (NORCO/VICODIN) 5-325 MG tablet Take 1 tablet by mouth every 6 (six) hours as needed for moderate pain or severe pain. (Patient taking differently: Take 1 tablet by mouth 3 (three) times daily as needed for moderate pain or severe pain. ) 15 tablet 0 PRN at PRN  . hydrOXYzine (ATARAX/VISTARIL) 25 MG tablet Take 25 mg by mouth every 6 (six) hours as needed.    PRN at PRN  . insulin detemir (LEVEMIR) 100 UNIT/ML injection Inject 0.02 mLs (2 Units total) into the skin 2 (two) times daily. (Patient taking differently: Inject 4 Units into the skin at bedtime. ) 10 mL 11 09/30/2017 at AM  . levofloxacin (LEVAQUIN) 750 MG tablet Take 750 mg by mouth every Monday, Wednesday, and Friday.   09/30/2017 at AM  . lipase/protease/amylase (CREON) 12000 UNITS CPEP capsule Take 24,000-36,000 Units by mouth 3 (three) times daily  with meals. Take 36000 mg in the morning, 24000 midday & 36000 in the evening   09/30/2017 at AM  . meclizine (ANTIVERT) 12.5 MG tablet Take 1 tablet by mouth three times daily as needed   PRN at PRN  . metoCLOPramide (REGLAN) 10 MG tablet Take 1 tablet (10 mg total) by mouth every 8 (eight) hours as needed for nausea or vomiting. (Patient taking differently: Take 5 mg by mouth 4 (four) times daily -  with meals and at bedtime. ) 30 tablet 1 PRN at PRN  . multivitamin (RENA-VIT) TABS tablet Take 1 tablet by mouth daily.    09/30/2017 at AM  . nitroGLYCERIN (NITROSTAT) 0.4 MG SL tablet Place 0.4 mg under the tongue every 5 (five) minutes x 3 doses as needed.   PRN at PRN  . omega-3 acid ethyl esters (LOVAZA) 1 g capsule Take 1 g by mouth daily.    09/30/2017 at AM  . promethazine (PHENERGAN) 12.5 MG tablet Take 12.5 mg by mouth every 6 (six) hours as needed for nausea or vomiting.   PRN at PRN  . ranitidine (ZANTAC) 150 MG tablet Take 150 mg by mouth 2 (two) times daily. In the afternoon   09/30/2017 at AM  . ranolazine (RANEXA) 500 MG 12 hr tablet Take 1,000 mg by mouth 2 (two) times daily.   09/30/2017 at AM  . rifaximin (XIFAXAN) 550 MG TABS tablet Take 550 mg by mouth 2 (two) times daily.   09/30/2017 at AM  . sevelamer carbonate (RENVELA) 800 MG tablet Take 800 mg by mouth 3 (three) times daily with meals.   09/30/2017 at AM  . sucralfate (CARAFATE) 1 g tablet Take 1 g by mouth 4 (four) times daily -  with meals and at bedtime.   09/30/2017 at AM  . famotidine (PEPCID) 20 MG tablet Take  1 tablet (20 mg total) by mouth 2 (two) times daily. (Patient not taking: Reported on 09/30/2017) 60 tablet 1 -- at --  . [DISCONTINUED] LORazepam (ATIVAN) 1 MG tablet Take 1 tablet (1 mg total) by mouth 2 (two) times daily. (Patient not taking: Reported on 09/30/2017) 20 tablet 0 -- at --  . [DISCONTINUED] OLANZapine (ZYPREXA) 5 MG tablet Take 0.5 tablets (2.5 mg total) by mouth at bedtime. (Patient not taking: Reported on 09/30/2017)  30 tablet 0 -- at --  . [DISCONTINUED] pantoprazole (PROTONIX) 40 MG tablet Take 1 tablet (40 mg total) by mouth 2 (two) times daily before a meal. (Patient not taking: Reported on 09/30/2017) 60 tablet 2 -- at --   Scheduled:  . aspirin EC  81 mg Oral Daily  . atorvastatin  20 mg Oral QHS  . cholecalciferol  400 Units Oral Daily  . famotidine  20 mg Oral BID  . FLUoxetine  20 mg Oral QHS  . heparin  5,000 Units Subcutaneous Q8H  . insulin aspart  0-9 Units Subcutaneous TID WC  . lipase/protease/amylase  36,000 Units Oral TID WC  . metoCLOPramide  5 mg Oral TID WC & HS  . multivitamin  1 tablet Oral Daily  . omega-3 acid ethyl esters  1 g Oral Daily  . pantoprazole  40 mg Oral Daily  . ranolazine  1,000 mg Oral BID  . rifaximin  550 mg Oral BID  . sevelamer carbonate  800 mg Oral TID WC  . sucralfate  1 g Oral TID WC & HS  . cyanocobalamin  1,000 mcg Oral Q lunch   Assessment: Pharmacy to dose Vancomycin and Zosyn in this ESRD patient requiring HD Mon, Wed, and Fri as an outpatient for treatment of osteomyelitis      Goal of Therapy:  VT 15-25  Plan:  Will give Vancomycin 2 g IV (load) and will start Vancomycin 1 g IV qHD thereafter. Will order Vancomycin level prior to the 3rd HD session per policy.  Zosyn: Will start Zosyn 3.375 g Iv q12 hours.   [name] 10/02/2017,9:37 AM

## 2017-10-02 NOTE — Progress Notes (Signed)
Pre-Dialysis assessment. 

## 2017-10-02 NOTE — Progress Notes (Signed)
Post HD assessment  

## 2017-10-02 NOTE — Progress Notes (Signed)
[facility]  ROUNDING NOTE   Subjective:   Seen and examined on hemodialysis. Tolerating treatment well. UF 2 liters.   Left toe amputation this morning. Dr. Elvina Mattes.     HEMODIALYSIS FLOWSHEET:  Blood Flow Rate (mL/min): 400 mL/min Arterial Pressure (mmHg): -160 mmHg Venous Pressure (mmHg): 170 mmHg Transmembrane Pressure (mmHg): 50 mmHg Ultrafiltration Rate (mL/min): 820 mL/min Dialysate Flow Rate (mL/min): 600 ml/min Conductivity: Machine : 14.1 Conductivity: Machine : 14.1 Dialysis Fluid Bolus: Normal Saline Bolus Amount (mL): 250 mL    Objective:  Vital signs in last 24 hours:  Temp:  [97.5 F (36.4 C)-98.3 F (36.8 C)] 98.1 F (36.7 C) (01/11 1415) Pulse Rate:  [63-67] 67 (01/11 1515) Resp:  [10-19] 19 (01/11 1515) BP: (123-166)/(41-85) 123/85 (01/11 1515) SpO2:  [96 %-100 %] 98 % (01/11 1515) Weight:  [98.1 kg (216 lb 4.3 oz)] 98.1 kg (216 lb 4.3 oz) (01/11 1410)  Weight change:  Filed Weights   09/30/17 1453 10/02/17 1410  Weight: 83.6 kg (184 lb 6.4 oz) 98.1 kg (216 lb 4.3 oz)    Intake/Output: I/O last 3 completed shifts: In: 360 [P.O.:360] Out: -    Intake/Output this shift:  Total I/O In: 50 [I.V.:50] Out: 5 [Blood:5]  Physical Exam: General: NAD,   Head: Normocephalic, atraumatic. Moist oral mucosal membranes  Eyes: Anicteric, PERRL  Neck: Supple, trachea midline  Lungs:  Clear to auscultation  Heart: Regular rate and rhythm  Abdomen:  Soft, nontender,   Extremities: no peripheral edema. Left foot in clean dressings  Neurologic: Nonfocal, moving all four extremities  Skin: No lesions  Access: Right AVG    Basic Metabolic Panel: Recent Labs  Lab 09/30/17 1555 10/01/17 0826  NA 133* 135  K 3.9 4.3  CL 95* 96*  CO2 28 28  GLUCOSE 191* 101*  BUN 8 10  CREATININE 2.91* 4.32*  CALCIUM 7.8* 7.7*    Liver Function Tests: No results for input(s): AST, ALT, ALKPHOS, BILITOT, PROT, ALBUMIN in the last 168 hours. No  results for input(s): LIPASE, AMYLASE in the last 168 hours. No results for input(s): AMMONIA in the last 168 hours.  CBC: Recent Labs  Lab 09/30/17 1555 10/01/17 0826  WBC 5.8 5.1  NEUTROABS 2.8  --   HGB 10.4* 9.8*  HCT 32.0* 30.8*  MCV 92.4 92.5  PLT 154 165    Cardiac Enzymes: No results for input(s): CKTOTAL, CKMB, CKMBINDEX, TROPONINI in the last 168 hours.  BNP: Invalid input(s): POCBNP  CBG: Recent Labs  Lab 10/01/17 2136 10/01/17 2203 10/02/17 0738 10/02/17 0830 10/02/17 1149  GLUCAP 62* 91 127* 121* 154*    Microbiology: Results for orders placed or performed during the hospital encounter of 09/30/17  Wound or Superficial Culture     Status: None (Preliminary result)   Collection Time: 09/30/17  3:10 PM  Result Value Ref Range Status   Specimen Description   Final    TOE LEFT Performed at [facility], [address] [place], [place] [address]    Special Requests   Final    NONE Performed at [facility], [address] [place], [place] [address]    Gram Stain   Final    RARE WBC PRESENT, PREDOMINANTLY MONONUCLEAR RARE GRAM POSITIVE COCCI    Culture   Final    FEW STAPHYLOCOCCUS AUREUS SUSCEPTIBILITIES TO FOLLOW Performed at [facility], [place] [address], [place], [place] [address]    Report Status PENDING  Incomplete  MRSA  PCR Screening     Status: None   Collection Time: 09/30/17  7:55 PM  Result Value Ref Range Status   MRSA by PCR NEGATIVE NEGATIVE Final    Comment:        The GeneXpert MRSA Assay (FDA approved for NASAL specimens only), is one component of a comprehensive MRSA colonization surveillance program. It is not intended to diagnose MRSA infection nor to guide or monitor treatment for MRSA infections. Performed at [facility], [address] [place], [place] [address]   Aerobic/Anaerobic Culture (surgical/deep wound)     Status: None (Preliminary result)   Collection Time: 10/02/17   8:02 AM  Result Value Ref Range Status   Specimen Description   Final    BONE Performed at [facility], [address], [place], [place] [address]    Special Requests   Final    GREAT TOE Performed at [facility], [address] [place], [place] [address]    Gram Stain   Final    RARE WBC PRESENT, PREDOMINANTLY MONONUCLEAR NO ORGANISMS SEEN Performed at [facility], [place] [address], [place], [place] [address]    Culture PENDING  Incomplete   Report Status PENDING  Incomplete    Coagulation Studies: Recent Labs    09/30/17 1555  LABPROT 12.4  INR 0.93    Urinalysis: No results for input(s): COLORURINE, LABSPEC, PHURINE, GLUCOSEU, HGBUR, BILIRUBINUR, KETONESUR, PROTEINUR, UROBILINOGEN, NITRITE, LEUKOCYTESUR in the last 72 hours.  Invalid input(s): APPERANCEUR    Imaging: No results found.   Medications:   . piperacillin-tazobactam (ZOSYN)  IV 3.375 g (10/02/17 0228)  . vancomycin     . aspirin EC  81 mg Oral Daily  . atorvastatin  20 mg Oral QHS  . cholecalciferol  400 Units Oral Daily  . famotidine  20 mg Oral BID  . FLUoxetine  20 mg Oral QHS  . heparin  5,000 Units Subcutaneous Q8H  . insulin aspart  0-9 Units Subcutaneous TID WC  . lipase/protease/amylase  36,000 Units Oral TID WC  . metoCLOPramide  5 mg Oral TID WC & HS  . multivitamin  1 tablet Oral Daily  . omega-3 acid ethyl esters  1 g Oral Daily  . pantoprazole  40 mg Oral Daily  . ranolazine  1,000 mg Oral BID  . rifaximin  550 mg Oral BID  . sevelamer carbonate  800 mg Oral TID WC  . sucralfate  1 g Oral TID WC & HS  . cyanocobalamin  1,000 mcg Oral Q lunch   acetaminophen, albuterol, alum & mag hydroxide-simeth, diphenhydrAMINE, docusate sodium, HYDROcodone-acetaminophen, hydrOXYzine, meclizine, morphine injection, nitroGLYCERIN, oxyCODONE-acetaminophen, promethazine  Assessment/ Plan:  [name] is a 57 y.o. black female with diabetes, hypertension,  hyperlipidemia, diabetic retinopathy, diabetic neuropathy, diabetic gastroparesis, pancreatic insufficiency, depression, GERD. Renal masses, M-spike   MWF [facility], Right AVG  1. End Stage Renal Disease: hemodialysis MWF Seen and examined on hemodialysis. Tolerating treatment well.   2. Hypertension: history of difficult to control. Blood pressure at goal.  - amlodipine and carvedilol are her outpatient medications.   3. Anemia of chronic kidney disease: hemoglobin 9.8 - Mircera as outpatient.   4. Secondary Hyperparathyroidism with hyperphosphatemia: with hypocalcemia - Continue sevelamer    LOS: 2 Panda Crossin 1/11/20193:30 PM

## 2017-10-02 NOTE — Consult Note (Signed)
Patient with right knee pain, swelling and DJD. Not a good candidate for corticosteroid injection Will have her follow up as outpatient for synvisc injection

## 2017-10-02 NOTE — H&P (Signed)
H and P has been reviewed and no changes are noted.  

## 2017-10-02 NOTE — Care Management (Addendum)
Notified Estill Bamberg with patient pathways of admission. Patient receives diaysis M-W-F. Osteo to left great tow. POD today. Plan pending. From home with family.

## 2017-10-02 NOTE — Progress Notes (Signed)
HD tx end  

## 2017-10-02 NOTE — Progress Notes (Signed)
Inpatient Diabetes Program Recommendations  AACE/ADA: New Consensus Statement on Inpatient Glycemic Control (2015)  Target Ranges:  Prepandial:   less than 140 mg/dL      Peak postprandial:   less than 180 mg/dL (1-2 hours)      Critically ill patients:  140 - 180 mg/dL   Results for JAZMENE, RACZ (MRN [id number]) as of 10/02/2017 11:56  Ref. Range 10/01/2017 07:54 10/01/2017 11:38 10/01/2017 16:43 10/01/2017 21:18  Glucose-Capillary Latest Ref Range: 65 - 99 mg/dL 93 176 (H) 121 (H) 61 (L)   Admit with: Osteomyelitis Toe  History: DM  Home DM Meds: Levemir 4 units QHS  Current Insulin Orders: Novolog Sensitive Correction Scale/ SSI (0-9 units) TID AC       MD- Note patient had mild Hypoglycemia last PM after receiving 2 units Novolog at 4pm.  NPO this AM.    --Will follow patient during hospitalization--  Wyn Quaker RN, MSN, CDE Diabetes Coordinator Inpatient Glycemic Control Team Team Pager: [phone] (8a-5p)

## 2017-10-02 NOTE — Progress Notes (Signed)
[facility] [place] NAME: [name]    MR#:  [id number]  DATE OF BIRTH:  23-Jan-1961  SUBJECTIVE:  CHIEF COMPLAINT:   Chief Complaint  Patient presents with  . Osteomyelitis    Pt without complaint, status post left great toe amputation dated by podiatry, for hemodialysis later today REVIEW OF SYSTEMS:  CONSTITUTIONAL: No fever, fatigue or weakness.  EYES: No blurred or double vision.  EARS, NOSE, AND THROAT: No tinnitus or ear pain.  RESPIRATORY: No cough, shortness of breath, wheezing or hemoptysis.  CARDIOVASCULAR: No chest pain, orthopnea, edema.  GASTROINTESTINAL: No nausea, vomiting, diarrhea or abdominal pain.  GENITOURINARY: No dysuria, hematuria.  ENDOCRINE: No polyuria, nocturia,  HEMATOLOGY: No anemia, easy bruising or bleeding SKIN: No rash or lesion. MUSCULOSKELETAL: No joint pain or arthritis.   NEUROLOGIC: No tingling, numbness, weakness.  PSYCHIATRY: No anxiety or depression.   ROS  DRUG ALLERGIES:   Allergies  Allergen Reactions  . Ace Inhibitors Swelling and Anaphylaxis  . Ativan [Lorazepam] Other (See Comments)    Reaction:Hallucinations and headaches Reaction:Hallucinations and headaches Reaction:  Hallucinations and headaches  . Compazine [Prochlorperazine Edisylate] Anaphylaxis and Nausea And Vomiting    Other reaction(s): dystonia from this vs. reglan 23 Jul - patient relates that she takes promethazine frequently with no problems. 23 Jul - patient relates that she takes promethazine frequently with no problems.  . Sumatriptan Succinate     Other reaction(s): delirium and hallucinations per [facility] records  . Dilaudid [Hydromorphone Hcl] Other (See Comments)    Delirium   . Ondansetron Other (See Comments)    hallucinations    . Zofran [Ondansetron Hcl] Other (See Comments)    Reaction:  hallucinations  Reaction:  hallucinations   . Codeine Nausea And Vomiting  . Gabapentin Other (See Comments)   Reaction:  Unknown    . [place] Nausea And Vomiting  . Losartan Nausea Only  . Oxycodone Anxiety  . Prochlorperazine Other (See Comments)    Reaction:  Unknown . Patient does not remember reaction but she does have vertigo and anxiety along with n and v at times. Could be used to treat any of these   . Reglan [Metoclopramide]     Per patient her Dr. Evelina Bucy her off it  Per patient her Dr. Evelina Bucy her off it   . Scopolamine Other (See Comments)    Dizziness, also has vertigo already  . Tape Rash  . Tapentadol Rash    VITALS:  Blood pressure (!) 146/54, pulse 65, temperature 98.1 F (36.7 C), temperature source Oral, resp. rate 15, height 5' 5.5" (1.664 m), weight 98.1 kg (216 lb 4.3 oz), SpO2 97 %.  PHYSICAL EXAMINATION:  GENERAL:  57 y.o.-year-old patient lying in the bed with no acute distress.  EYES: Pupils equal, round, reactive to light and accommodation. No scleral icterus. Extraocular muscles intact.  HEENT: Head atraumatic, normocephalic. Oropharynx and nasopharynx clear.  NECK:  Supple, no jugular venous distention. No thyroid enlargement, no tenderness.  LUNGS: Normal breath sounds bilaterally, no wheezing, rales,rhonchi or crepitation. No use of accessory muscles of respiration.  CARDIOVASCULAR: S1, S2 normal. No murmurs, rubs, or gallops.  ABDOMEN: Soft, nontender, nondistended. Bowel sounds present. No organomegaly or mass.  EXTREMITIES: Left foot/lower extremity dressing clean/dryness/intact, No pedal edema, cyanosis, or clubbing.  NEUROLOGIC: Cranial nerves II through XII are intact. MAES. Gait not checked.  PSYCHIATRIC: The patient is alert and oriented x 3.  SKIN: No obvious rash, lesion,  or ulcer.   Physical Exam LABORATORY PANEL:   CBC Recent Labs  Lab 10/01/17 0826  WBC 5.1  HGB 9.8*  HCT 30.8*  PLT 165   ------------------------------------------------------------------------------------------------------------------  Chemistries  Recent Labs  Lab  10/01/17 0826  NA 135  K 4.3  CL 96*  CO2 28  GLUCOSE 101*  BUN 10  CREATININE 4.32*  CALCIUM 7.7*   ------------------------------------------------------------------------------------------------------------------  Cardiac Enzymes No results for input(s): TROPONINI in the last 168 hours. ------------------------------------------------------------------------------------------------------------------  RADIOLOGY:  Dg Knee Complete 4 Views Right  Result Date: 09/30/2017 CLINICAL DATA:  Fall, injury to right knee EXAM: RIGHT KNEE - COMPLETE 4+ VIEW COMPARISON:  01/06/2012 FINDINGS: No acute bony abnormality. Specifically, no fracture, subluxation, or dislocation. No joint effusion. Joint spaces maintained. Vascular calcifications noted. IMPRESSION: No acute bony abnormality. Electronically Signed   By: Rolm Baptise M.D.   On: 09/30/2017 15:27    ASSESSMENT AND PLAN:  1 acute left great toe osteomyelitis Stable Continue empiricIV vanc+ Zosyn, follow-up on cultures, s/p left great toe amputation 10/02/2017 by podiatry, continue to hold Plavix given recent surgery   2 chronic diabetes mellitus type 2  Stable on current regiment   3 chronic benign essential hypertension  Stable   Blood pressure medication on hold on admission for relative hypotension, vitals per routine, make changes as per necessary   4 chronic ESRD On hemodialysis MWF For dialysis later today  5 history of CAD Stable Continue to hold plavix given  recent surgery   6 acute right knee pain  Stable  X-rays negative for acute fracture  orthopedic surgery input greatly appreciated-follow-up as an outpatient for possible Synvisc injection  All the records are reviewed and case discussed with Care Management/Social Workerr. Management plans discussed with the patient, family and they are in agreement.  CODE STATUS: full  TOTAL TIME TAKING CARE OF THIS PATIENT: 35 minutes.     POSSIBLE D/C IN 1-2  DAYS, DEPENDING ON CLINICAL CONDITION.   Avel Peace Salary M.D on 10/02/2017   Between 7am to 6pm - Pager - [phone]  After 6pm go to [email or web address] - password [email or web address] [facility]  Office  [phone]  CC: Primary care physician; Freddy Finner, NP  Note: This dictation was prepared with Dragon dictation along with smaller phrase technology. Any transcriptional errors that result from this process are unintentional.

## 2017-10-02 NOTE — Progress Notes (Signed)
Dialysis treatment started. 

## 2017-10-02 NOTE — Anesthesia Post-op Follow-up Note (Signed)
Anesthesia QCDR form completed.        

## 2017-10-02 NOTE — Op Note (Signed)
Operative note   Surgeon: Dr. Albertine Patricia, DPM.    Assistant: None    Preop diagnosis: Osteomyelitis distal phalanx left hallux    Postop diagnosis: Same    Procedure:   1.  Amputation distal left hallux including a portion of the proximal phalanx          EBL: 20 cc    Anesthesia:IV sedation R the anesthesia team I delivered 4 cc of 0.5% Marcaine plain preoperatively    Hemostasis: Ankle tourniquet 225 for 15 minutes    Specimen: Distal portion of left great toe.  Also a portion of the distal bone where the infection was encountered was sent for culture and sensitivity     Complications: None    Operative indications: Chronic osteomyelitis distal phalanx left hallux.  Patient was hospitalized with increasing symptoms to the left foot and leg    Procedure:  Patient was brought into the OR and placed on the operating table in thesupine position. After anesthesia was obtained theleft lower extremity was prepped and draped in usual sterile fashion.  Operative Report: This time attention was directed to the distal portion of the left great toe 2 semielliptical incisions were made around the end of the toe proximal to the nail complex.  The incision was carried down to bone and soft tissue was freed away from the distal phalanx to the IP joint level.  At this point the toe was removed distally.  This point is also option to go and resect the distal portion of the proximal phalanx.  Number was proximal phalanx head.  This is resected and rasped smoothly and the areas were copiously irrigated.  Tissues at this point were clean and showed no evidence of infection.  The tourniquet was released and prompt complete vascularity was seen to return to the areas bleeders were clamped and bovied as required.  4-0 Vicryl was then used to suture subcutaneous tissue together.  The skin along the areas were then closed with 3-0 nylon simple interrupted sutures. A Dog ear was located centrally on the  plantar flap so a v shaped section of skin was removed from, the dog ear and  this is also closed with the 3-0 nylon simple sutures.  Good bleeding occurred throughout the process.  There is been copiously irrigated throughout.  A sterile compressive dressings in place across wound consisting of Xeroform gauze 4 x 4's Kling and Kerlix.    Patient tolerated the procedure and anesthesia well.  Was transported from the OR to the PACU with all vital signs stable and vascular status intact. To be discharged per routine protocol.  Will follow up in approximately 1 week in the outpatient clinic.

## 2017-10-03 ENCOUNTER — Inpatient Hospital Stay: Payer: Medicare Other

## 2017-10-03 ENCOUNTER — Encounter: Payer: Self-pay | Admitting: Podiatry

## 2017-10-03 LAB — AEROBIC CULTURE W GRAM STAIN (SUPERFICIAL SPECIMEN)

## 2017-10-03 LAB — GLUCOSE, CAPILLARY
Glucose-Capillary: 177 mg/dL — ABNORMAL HIGH (ref 65–99)
Glucose-Capillary: 64 mg/dL — ABNORMAL LOW (ref 65–99)
Glucose-Capillary: 74 mg/dL (ref 65–99)
Glucose-Capillary: 177 mg/dL — ABNORMAL HIGH (ref 65–99)
Glucose-Capillary: 163 mg/dL — ABNORMAL HIGH (ref 65–99)

## 2017-10-03 NOTE — Progress Notes (Signed)
Pt alert and oriented. Medicated for pain at bedtime with good results. Surgical dressing remaining dry and intact. Pt able to sleep in between care.

## 2017-10-03 NOTE — Progress Notes (Signed)
Spoke with dr. Jerelyn Charles to make aware bone culture came back staphlococcus areus. No new orders received

## 2017-10-03 NOTE — Progress Notes (Signed)
Hypoglycemic Event  CBG: 64  Treatment: 15 GM carbohydrate snack  Symptoms: None  Follow-up CBG: Time:1211 CBG Result:74  Possible Reasons for Event: Vomiting  Comments/MD notified    [name]

## 2017-10-03 NOTE — Progress Notes (Signed)
Patient Demographics  [name], is a 57 y.o. female   MRN: [id number]   DOB - Mar 30, 1961  Admit Date - 09/30/2017    Outpatient Primary MD for the patient is Freddy Finner, NP  Consult requested in the Hospital by [name], MD, On 10/03/2017 With History of -  Past Medical History:  Diagnosis Date  . Anemia   . Anginal pain ([place])   . Anxiety   . Arthritis   . Asthma   . Broken wrist   . Bronchitis   . chronic diastolic CHF 5/68/1275  . COPD (chronic obstructive pulmonary disease) ([place])   . Coronary artery disease    a. cath 2013: stenting to RCA (report not available); b. cath 2014: LM nl, pLAD 40%, mLAD nl, ost LCx 40%, mid LCx nl, pRCA 30% @ site of prior stent, mRCA 50%  . Depression   . Diabetes mellitus without complication ([place])   . Diabetic neuropathy ([place])   . dialysis 2006  . Diverticulosis   . Dizziness   . Dyspnea   . Elevated lipids   . Environmental and seasonal allergies   . ESRD (end stage renal disease) on dialysis ([place])    M-W-F  . GERD (gastroesophageal reflux disease)   . Headache   . History of hiatal hernia   . HOH (hard of hearing)   . Hx of pancreatitis 2015  . Hypertension   . Lower extremity edema   . Mitral regurgitation    a. echo 10/2013: EF 62%, noWMA, mildly dilated LA, mild to mod MR/TR, GR1DD  . Myocardial infarction ([place])   . Orthopnea   . Pneumonia   . Renal cancer ([place])   . Renal insufficiency    Pt is on dialysis on M,W + F.  . Wheezing       Past Surgical History:  Procedure Laterality Date  . ABDOMINAL HYSTERECTOMY  1992  . AMPUTATION TOE Left 10/02/2017   Procedure: AMPUTATION TOE-LEFT GREAT TOE;  Surgeon: Albertine Patricia, DPM;  Location: [facility] ORS;  Service: Podiatry;  Laterality: Left;  . APPENDECTOMY    . CARDIAC CATHETERIZATION Left 07/26/2015   Procedure: Left Heart Cath and  Coronary Angiography;  Surgeon: Dionisio David, MD;  Location: [place] [facility];  Service: Cardiovascular;  Laterality: Left;  . CATARACT EXTRACTION W/ INTRAOCULAR LENS IMPLANT Right   . CATARACT EXTRACTION W/PHACO Left 03/10/2017   Procedure: CATARACT EXTRACTION PHACO AND INTRAOCULAR LENS PLACEMENT (IOC);  Surgeon: Birder Robson, MD;  Location: [facility] ORS;  Service: Ophthalmology;  Laterality: Left;  [place] 00:51.9 AP% 14.2 CDE 7.39 fluid pack lot # [id number]  . CHOLECYSTECTOMY    . COLONOSCOPY WITH PROPOFOL N/A 08/12/2016   Procedure: COLONOSCOPY WITH PROPOFOL;  Surgeon: Lollie Sails, MD;  Location: [facility] ENDOSCOPY;  Service: Endoscopy;  Laterality: N/A;  . DIALYSIS FISTULA CREATION Left    upper arm  . dialysis grafts    . ESOPHAGOGASTRODUODENOSCOPY N/A 03/08/2015   Procedure: ESOPHAGOGASTRODUODENOSCOPY (EGD);  Surgeon: Manya Silvas, MD;  Location: [facility] ENDOSCOPY;  Service: Endoscopy;  Laterality: N/A;  . ESOPHAGOGASTRODUODENOSCOPY (EGD) WITH PROPOFOL N/A 03/18/2016   Procedure: ESOPHAGOGASTRODUODENOSCOPY (EGD) WITH PROPOFOL;  Surgeon: [name], MD;  Location: [facility] ENDOSCOPY;  Service: Endoscopy;  Laterality: N/A;  . EYE SURGERY Right 2018  . FECAL TRANSPLANT N/A 08/23/2015   Procedure: FECAL TRANSPLANT;  Surgeon: Manya Silvas, MD;  Location: [facility] ENDOSCOPY;  Service: Endoscopy;  Laterality: N/A;  . HAND SURGERY Bilateral   . PERIPHERAL VASCULAR  CATHETERIZATION N/A 12/20/2015   Procedure: Thrombectomy of dialysis access versus permcath placement;  Surgeon: Algernon Huxley, MD;  Location: [place] [facility];  Service: Cardiovascular;  Laterality: N/A;  . PERIPHERAL VASCULAR CATHETERIZATION N/A 12/20/2015   Procedure: A/V Shunt Intervention;  Surgeon: Algernon Huxley, MD;  Location: [place] [facility];  Service: Cardiovascular;  Laterality: N/A;  . PERIPHERAL VASCULAR CATHETERIZATION N/A 12/20/2015   Procedure: A/V Shuntogram/Fistulagram;  Surgeon: Algernon Huxley, MD;  Location: [place] [facility];  Service: Cardiovascular;  Laterality: N/A;  . PERIPHERAL VASCULAR CATHETERIZATION N/A 01/02/2016   Procedure: A/V Shuntogram/Fistulagram;  Surgeon: Algernon Huxley, MD;  Location: [place] [facility];  Service: Cardiovascular;  Laterality: N/A;  . PERIPHERAL VASCULAR CATHETERIZATION N/A 01/02/2016   Procedure: A/V Shunt Intervention;  Surgeon: Algernon Huxley, MD;  Location: [place] [facility];  Service: Cardiovascular;  Laterality: N/A;    in for   Chief Complaint  Patient presents with  . Osteomyelitis      HPI  [name]  is a 57 y.o. female, 1 day status post amputation to the distal phalanx and proximal phalanx head distal aspect of the left great toe secondary to osteomyelitis.  She tolerated the procedure and the anesthesia well.    Review of Systems    In addition to the HPI above,  No Fever-chills, No Headache, No changes with Vision or hearing, No problems swallowing food or Liquids, No Chest pain, Cough or Shortness of Breath, No Abdominal pain, No Nausea or Vommitting, Bowel movements are regular, No Blood in stool or Urine, No dysuria, No new skin rashes or bruises, No new joints pains-aches,  No new weakness, tingling, numbness in any extremity, No recent weight gain or loss, No polyuria, polydypsia or polyphagia, No significant Mental Stressors.  A full 10 point Review of Systems was done, except as stated above, all other Review of Systems were negative.   Social History Social History   Tobacco Use  . Smoking status: Former Smoker    Packs/day: 0.50    Years: 40.00    Pack years: 20.00    Types: Cigarettes    Last attempt to quit: 02/13/2015    Years since quitting: 2.6  . Smokeless tobacco: Never Used  Substance Use Topics  . Alcohol use: Yes    Alcohol/week: 0.6 oz    Types: 1 Glasses of wine per week    Comment: glass wine week per pt     Family History Family History  Problem Relation Age of Onset  . Kidney disease Mother    . Diabetes Mother   . Cancer Father   . Kidney disease Sister      Prior to Admission medications   Medication Sig Start Date End Date Taking? Authorizing Provider  albuterol (PROVENTIL HFA;VENTOLIN HFA) 108 (90 Base) MCG/ACT inhaler Inhale 2 puffs into the lungs every 6 (six) hours as needed for shortness of breath.    Yes [provider]  albuterol (PROVENTIL) (2.5 MG/3ML) 0.083% nebulizer solution Take 2.5 mg by nebulization every 6 (six) hours as needed for wheezing or shortness of breath.   Yes [provider]  alum & mag hydroxide-simeth (MAALOX MAX) 400-400-40 MG/5ML suspension Take 5 mLs by mouth every 6 (six) hours as needed for indigestion. 06/09/17  Yes Veronese, [place], MD  amLODipine (NORVASC) 10 MG tablet Take 10 mg by mouth daily.   Yes [provider]  aspirin EC 81 MG tablet Take 81 mg by  mouth daily. 09/08/16  Yes [provider]  atorvastatin (LIPITOR) 20 MG tablet Take 20 mg by mouth at bedtime.  12/02/16  Yes [provider]  carvedilol (COREG) 25 MG tablet Take 25 mg by mouth 2 (two) times daily with a meal.   Yes [provider]  cetirizine (ZYRTEC) 10 MG tablet Take 10 mg by mouth daily.    Yes [provider]  cholecalciferol (VITAMIN D) 400 units TABS tablet Take 400 Units by mouth daily.   Yes [provider]  ciprofloxacin (CIPRO) 500 MG tablet Take 500 mg by mouth 2 (two) times daily. 09/29/17 10/20/2017 Yes [provider]  clindamycin (CLEOCIN) 300 MG capsule Take 300 mg by mouth 2 (two) times daily. 09/29/17 10/13/2017 Yes [provider]  clopidogrel (PLAVIX) 75 MG tablet Take 1 tablet (75 mg total) by mouth daily. 03/25/17 03/25/18 Yes Dustin Flock, MD  cyanocobalamin 1000 MCG tablet Take 1,000 mcg by mouth daily with lunch.    Yes [provider]  dexlansoprazole (DEXILANT) 60 MG capsule Take 60 mg by mouth daily.   Yes [provider]  diphenhydrAMINE (BENADRYL)  25 mg capsule Take 25 mg by mouth every 6 (six) hours as needed for itching.   Yes [provider]  FLUoxetine (PROZAC) 20 MG capsule Take 1 capsule (20 mg total) by mouth daily. Patient taking differently: Take 20 mg by mouth at bedtime.  04/10/16  Yes Hower, Aaron Mose, MD  glucose 4 GM chewable tablet Chew 3-4 tablets by mouth as needed for low blood sugar (blood sugar less than 60).    Yes [provider]  HYDROcodone-acetaminophen (NORCO/VICODIN) 5-325 MG tablet Take 1 tablet by mouth every 6 (six) hours as needed for moderate pain or severe pain. Patient taking differently: Take 1 tablet by mouth 3 (three) times daily as needed for moderate pain or severe pain.  05/05/17  Yes Gouru, Illene Silver, MD  hydrOXYzine (ATARAX/VISTARIL) 25 MG tablet Take 25 mg by mouth every 6 (six) hours as needed.    Yes [provider]  insulin detemir (LEVEMIR) 100 UNIT/ML injection Inject 0.02 mLs (2 Units total) into the skin 2 (two) times daily. Patient taking differently: Inject 4 Units into the skin at bedtime.  09/05/17  Yes Epifanio Lesches, MD  levofloxacin (LEVAQUIN) 750 MG tablet Take 750 mg by mouth every Monday, Wednesday, and Friday.   Yes [provider]  lipase/protease/amylase (CREON) 12000 UNITS CPEP capsule Take 24,000-36,000 Units by mouth 3 (three) times daily with meals. Take 36000 mg in the morning, 24000 midday & 36000 in the evening   Yes [provider]  meclizine (ANTIVERT) 12.5 MG tablet Take 1 tablet by mouth three times daily as needed   Yes [provider]  metoCLOPramide (REGLAN) 10 MG tablet Take 1 tablet (10 mg total) by mouth every 8 (eight) hours as needed for nausea or vomiting. Patient taking differently: Take 5 mg by mouth 4 (four) times daily -  with meals and at bedtime.  06/08/17  Yes Earleen Newport, MD  multivitamin (RENA-VIT) TABS tablet Take 1 tablet by mouth daily.    Yes [provider]  nitroGLYCERIN  (NITROSTAT) 0.4 MG SL tablet Place 0.4 mg under the tongue every 5 (five) minutes x 3 doses as needed.   Yes [provider]  omega-3 acid ethyl esters (LOVAZA) 1 g capsule Take 1 g by mouth daily.    Yes [provider]  promethazine (PHENERGAN) 12.5 MG tablet Take  12.5 mg by mouth every 6 (six) hours as needed for nausea or vomiting.   Yes [provider]  ranitidine (ZANTAC) 150 MG tablet Take 150 mg by mouth 2 (two) times daily. In the afternoon   Yes [provider]  ranolazine (RANEXA) 500 MG 12 hr tablet Take 1,000 mg by mouth 2 (two) times daily.   Yes [provider]  rifaximin (XIFAXAN) 550 MG TABS tablet Take 550 mg by mouth 2 (two) times daily.   Yes [provider]  sevelamer carbonate (RENVELA) 800 MG tablet Take 800 mg by mouth 3 (three) times daily with meals.   Yes [provider]  sucralfate (CARAFATE) 1 g tablet Take 1 g by mouth 4 (four) times daily -  with meals and at bedtime.   Yes [provider]  famotidine (PEPCID) 20 MG tablet Take 1 tablet (20 mg total) by mouth 2 (two) times daily. Patient not taking: Reported on 09/30/2017 06/09/17 06/09/18  Rudene Re, MD    Anti-infectives (From admission, onward)   Start     Dose/Rate Route Frequency Ordered Stop   10/02/17 1200  vancomycin (VANCOCIN) IVPB 1000 mg/200 mL premix     1,000 mg 200 mL/hr over 60 Minutes Intravenous Every M-W-F (Hemodialysis) 09/30/17 1635     10/01/17 1200  rifaximin (XIFAXAN) tablet 550 mg     550 mg Oral 2 times daily 10/01/17 1051     10/01/17 0200  piperacillin-tazobactam (ZOSYN) IVPB 3.375 g     3.375 g 12.5 mL/hr over 240 Minutes Intravenous Every 12 hours 09/30/17 1635     09/30/17 1730  vancomycin (VANCOCIN) 2,000 mg in sodium chloride 0.9 % 500 mL IVPB     2,000 mg 250 mL/hr over 120 Minutes Intravenous  Once 09/30/17 1631 09/30/17 1937   09/30/17 1645  vancomycin (VANCOCIN) 2,000 mg in sodium chloride 0.9 % 500  mL IVPB  Status:  Discontinued     2,000 mg 250 mL/hr over 120 Minutes Intravenous  Once 09/30/17 1631 09/30/17 1631   09/30/17 1515  piperacillin-tazobactam (ZOSYN) IVPB 3.375 g     3.375 g 100 mL/hr over 30 Minutes Intravenous  Once 09/30/17 1507 09/30/17 1703   09/30/17 1515  vancomycin (VANCOCIN) IVPB 1000 mg/200 mL premix  Status:  Discontinued     1,000 mg 200 mL/hr over 60 Minutes Intravenous  Once 09/30/17 1507 09/30/17 1631      Scheduled Meds: . aspirin EC  81 mg Oral Daily  . atorvastatin  20 mg Oral QHS  . cholecalciferol  400 Units Oral Daily  . famotidine  20 mg Oral BID  . FLUoxetine  20 mg Oral QHS  . heparin  5,000 Units Subcutaneous Q8H  . insulin aspart  0-9 Units Subcutaneous TID WC  . lipase/protease/amylase  36,000 Units Oral TID WC  . metoCLOPramide  5 mg Oral TID WC & HS  . multivitamin  1 tablet Oral Daily  . omega-3 acid ethyl esters  1 g Oral Daily  . pantoprazole  40 mg Oral Daily  . ranolazine  1,000 mg Oral BID  . rifaximin  550 mg Oral BID  . sevelamer carbonate  800 mg Oral TID WC  . sucralfate  1 g Oral TID WC & HS  . cyanocobalamin  1,000 mcg Oral Q lunch   Continuous Infusions: . piperacillin-tazobactam (ZOSYN)  IV Stopped (10/03/17 0554)  . vancomycin Stopped (10/02/17 1700)   PRN Meds:.acetaminophen, albuterol, alum & mag hydroxide-simeth, diphenhydrAMINE, docusate sodium, HYDROcodone-acetaminophen,  hydrOXYzine, meclizine, morphine injection, nitroGLYCERIN, oxyCODONE-acetaminophen, promethazine  Allergies  Allergen Reactions  . Ace Inhibitors Swelling and Anaphylaxis  . Ativan [Lorazepam] Other (See Comments)    Reaction:Hallucinations and headaches Reaction:Hallucinations and headaches Reaction:  Hallucinations and headaches  . Compazine [Prochlorperazine Edisylate] Anaphylaxis and Nausea And Vomiting    Other reaction(s): dystonia from this vs. reglan 23 Jul - patient relates that she takes promethazine frequently with no  problems. 23 Jul - patient relates that she takes promethazine frequently with no problems.  . Sumatriptan Succinate     Other reaction(s): delirium and hallucinations per [facility] records  . Dilaudid [Hydromorphone Hcl] Other (See Comments)    Delirium   . Ondansetron Other (See Comments)    hallucinations    . Zofran [Ondansetron Hcl] Other (See Comments)    Reaction:  hallucinations  Reaction:  hallucinations   . Codeine Nausea And Vomiting  . Gabapentin Other (See Comments)    Reaction:  Unknown    . Lac Bovis Nausea And Vomiting  . Losartan Nausea Only  . Oxycodone Anxiety  . Prochlorperazine Other (See Comments)    Reaction:  Unknown . Patient does not remember reaction but she does have vertigo and anxiety along with n and v at times. Could be used to treat any of these   . Reglan [Metoclopramide]     Per patient her Dr. Evelina Bucy her off it  Per patient her Dr. Evelina Bucy her off it   . Scopolamine Other (See Comments)    Dizziness, also has vertigo already  . Tape Rash  . Tapentadol Rash    Physical Exam: Patient is having some nausea when he came in this morning says she has had a little bit over the course of last night as well but she does have Phenergan ordered and will try some that see if it helps calm things down.  She states she has not had to take much of the pain medication at this timeframe.  States she is not really having pain at this point.  S she has remained afebrile.  Vitals  Blood pressure (!) 138/48, pulse 69, temperature 97.9 F (36.6 C), temperature source Oral, resp. rate 18, height 5' 5.5" (1.664 m), weight 98.1 kg (216 lb 4.3 oz), SpO2 91 %.  Lower Extremity exam:  Data Review  CBC Recent Labs  Lab 09/30/17 1555 10/01/17 0826  WBC 5.8 5.1  HGB 10.4* 9.8*  HCT 32.0* 30.8*  PLT 154 165  MCV 92.4 92.5  MCH 30.0 29.4  MCHC 32.4 31.8*  RDW 14.6* 14.9*  LYMPHSABS 1.8  --   MONOABS 0.8  --   EOSABS 0.4  --   BASOSABS 0.0  --     ------------------------------------------------------------------------------------------------------------------  Chemistries  Recent Labs  Lab 09/30/17 1555 10/01/17 0826  NA 133* 135  K 3.9 4.3  CL 95* 96*  CO2 28 28  GLUCOSE 191* 101*  BUN 8 10  CREATININE 2.91* 4.32*  CALCIUM 7.8* 7.7*   ---------------------------------------------------------------------------------------- Assessment & Plan: Dressing is intact clean and dry so I will leave it in place.  Will change tomorrow.  Hopefully on Monday or Tuesday when culture reports are back we can send her home.  Will get a infectious disease consult also.  We removed enough of the infected bone that she may be able to go home just on oral antibiotics if infectious diseases in agreement.  Principal Problem:   Osteomyelitis [facility])  Family Communication: Plan discussed with patient  Eloise Harman.D  on 10/03/2017 at 9:54 AM  Thank you for the consult, we will follow the patient with you in the Hospital.

## 2017-10-03 NOTE — Progress Notes (Signed)
[place] [facility] [place] NAME: [name]    MR#:  [id number]  DATE OF BIRTH:  May 28, 1961  SUBJECTIVE:  CHIEF COMPLAINT:   Chief Complaint  Patient presents with  . Osteomyelitis   Patient complains of nausea/emesis x1  REVIEW OF SYSTEMS:  CONSTITUTIONAL: No fever, fatigue or weakness.  EYES: No blurred or double vision.  EARS, NOSE, AND THROAT: No tinnitus or ear pain.  RESPIRATORY: No cough, shortness of breath, wheezing or hemoptysis.  CARDIOVASCULAR: No chest pain, orthopnea, edema.  GASTROINTESTINAL: No nausea, vomiting, diarrhea or abdominal pain.  GENITOURINARY: No dysuria, hematuria.  ENDOCRINE: No polyuria, nocturia,  HEMATOLOGY: No anemia, easy bruising or bleeding SKIN: No rash or lesion. MUSCULOSKELETAL: No joint pain or arthritis.   NEUROLOGIC: No tingling, numbness, weakness.  PSYCHIATRY: No anxiety or depression.   ROS  DRUG ALLERGIES:   Allergies  Allergen Reactions  . Ace Inhibitors Swelling and Anaphylaxis  . Ativan [Lorazepam] Other (See Comments)    Reaction:Hallucinations and headaches Reaction:Hallucinations and headaches Reaction:  Hallucinations and headaches  . Compazine [Prochlorperazine Edisylate] Anaphylaxis and Nausea And Vomiting    Other reaction(s): dystonia from this vs. reglan 23 Jul - patient relates that she takes promethazine frequently with no problems. 23 Jul - patient relates that she takes promethazine frequently with no problems.  . Sumatriptan Succinate     Other reaction(s): delirium and hallucinations per [name] records  . Dilaudid [Hydromorphone Hcl] Other (See Comments)    Delirium   . Ondansetron Other (See Comments)    hallucinations    . Zofran [Ondansetron Hcl] Other (See Comments)    Reaction:  hallucinations  Reaction:  hallucinations   . Codeine Nausea And Vomiting  . Gabapentin Other (See Comments)    Reaction:  Unknown    . Lac Bovis Nausea And Vomiting  . Losartan  Nausea Only  . Oxycodone Anxiety  . Prochlorperazine Other (See Comments)    Reaction:  Unknown . Patient does not remember reaction but she does have vertigo and anxiety along with n and v at times. Could be used to treat any of these   . Reglan [Metoclopramide]     Per patient her Dr. Evelina Bucy her off it  Per patient her Dr. Evelina Bucy her off it   . Scopolamine Other (See Comments)    Dizziness, also has vertigo already  . Tape Rash  . Tapentadol Rash    VITALS:  Blood pressure (!) 138/48, pulse 69, temperature 97.9 F (36.6 C), temperature source Oral, resp. rate 18, height 5' 5.5" (1.664 m), weight 98.1 kg (216 lb 4.3 oz), SpO2 91 %.  PHYSICAL EXAMINATION:  GENERAL:  57 y.o.-year-old patient lying in the bed with no acute distress.  EYES: Pupils equal, round, reactive to light and accommodation. No scleral icterus. Extraocular muscles intact.  HEENT: Head atraumatic, normocephalic. Oropharynx and nasopharynx clear.  NECK:  Supple, no jugular venous distention. No thyroid enlargement, no tenderness.  LUNGS: Normal breath sounds bilaterally, no wheezing, rales,rhonchi or crepitation. No use of accessory muscles of respiration.  CARDIOVASCULAR: S1, S2 normal. No murmurs, rubs, or gallops.  ABDOMEN: Soft, nontender, nondistended. Bowel sounds present. No organomegaly or mass.  EXTREMITIES: No pedal edema, cyanosis, or clubbing.  NEUROLOGIC: Cranial nerves II through XII are intact. Muscle strength 5/5 in all extremities. Sensation intact. Gait not checked.  PSYCHIATRIC: The patient is alert and oriented x 3.  SKIN: No obvious rash, lesion, or ulcer.   Physical Exam LABORATORY  PANEL:   CBC Recent Labs  Lab 10/01/17 0826  WBC 5.1  HGB 9.8*  HCT 30.8*  PLT 165   ------------------------------------------------------------------------------------------------------------------  Chemistries  Recent Labs  Lab 10/01/17 0826  NA 135  K 4.3  CL 96*  CO2 28  GLUCOSE 101*  BUN 10   CREATININE 4.32*  CALCIUM 7.7*   ------------------------------------------------------------------------------------------------------------------  Cardiac Enzymes No results for input(s): TROPONINI in the last 168 hours. ------------------------------------------------------------------------------------------------------------------  RADIOLOGY:  No results found.  ASSESSMENT AND PLAN:   1acute left great toe osteomyelitis Stable Continue empiric IV vancomycin, wound cultures noted for staph aureus-sensitivities pending, discontinue Zosyn, infectious disease consulted for expert opinion, s/p left great toe amputation 10/02/2017 by podiatry   2chronic diabetes mellitus type 2  Stable on current regiment  3chronic benign essential hypertension  Stable   Normotensive without blood pressure medication   4chronic ESRD On hemodialysisMWF Nephrology input greatly appreciated-received dialysis on yesterday  5history of CAD Stable Continue to holdplavix given  recent surgery  6acute right knee pain  Stable  X-rays negative for acute fracture  orthopedic surgery input greatly appreciated-follow-up as an outpatient for possible Synvisc injection  7 acute nausea/emesis Antiemetics PRN and check acute abdominal series   All the records are reviewed and case discussed with Care Management/Social Workerr. Management plans discussed with the patient, family and they are in agreement.  CODE STATUS: full  TOTAL TIME TAKING CARE OF THIS PATIENT: 35 minutes.     POSSIBLE D/C IN 2-4 DAYS, DEPENDING ON CLINICAL CONDITION.   Avel Peace Tyjon Bowen M.D on 10/03/2017   Between 7am to 6pm - Pager - [phone]  After 6pm go to [email or web address] - password [email or web address] [place]  Office  [phone]  CC: Primary care physician; Robin Finner, NP  Note: This dictation was prepared with Dragon dictation along with smaller phrase technology. Any  transcriptional errors that result from this process are unintentional.

## 2017-10-03 NOTE — Progress Notes (Signed)
[facility]  ROUNDING NOTE   Subjective:   Hemodialysis treatment yesterday. UF 2264 - tolerated treatment well.    Objective:  Vital signs in last 24 hours:  Temp:  [97.9 F (36.6 C)-98.9 F (37.2 C)] 97.9 F (36.6 C) (01/12 0735) Pulse Rate:  [63-74] 69 (01/12 0735) Resp:  [12-20] 18 (01/12 0735) BP: (123-167)/(46-85) 138/48 (01/12 0735) SpO2:  [91 %-100 %] 91 % (01/12 0735) Weight:  [98.1 kg (216 lb 4.3 oz)] 98.1 kg (216 lb 4.3 oz) (01/11 1410)  Weight change:  Filed Weights   09/30/17 1453 10/02/17 1410  Weight: 83.6 kg (184 lb 6.4 oz) 98.1 kg (216 lb 4.3 oz)    Intake/Output: I/O last 3 completed shifts: In: 50 [I.V.:50] Out: 2269 [Other:2264; Blood:5]   Intake/Output this shift:  Total I/O In: 240 [P.O.:240] Out: -   Physical Exam: General: NAD,   Head: Normocephalic, atraumatic. Moist oral mucosal membranes  Eyes: Anicteric, PERRL  Neck: Supple, trachea midline  Lungs:  Clear to auscultation  Heart: Regular rate and rhythm  Abdomen:  Soft, nontender,   Extremities: no peripheral edema. Left foot in clean dressings  Neurologic: Nonfocal, moving all four extremities  Skin: No lesions  Access: Right AVG    Basic Metabolic Panel: Recent Labs  Lab 09/30/17 1555 10/01/17 0826  NA 133* 135  K 3.9 4.3  CL 95* 96*  CO2 28 28  GLUCOSE 191* 101*  BUN 8 10  CREATININE 2.91* 4.32*  CALCIUM 7.8* 7.7*    Liver Function Tests: No results for input(s): AST, ALT, ALKPHOS, BILITOT, PROT, ALBUMIN in the last 168 hours. No results for input(s): LIPASE, AMYLASE in the last 168 hours. No results for input(s): AMMONIA in the last 168 hours.  CBC: Recent Labs  Lab 09/30/17 1555 10/01/17 0826  WBC 5.8 5.1  NEUTROABS 2.8  --   HGB 10.4* 9.8*  HCT 32.0* 30.8*  MCV 92.4 92.5  PLT 154 165    Cardiac Enzymes: No results for input(s): CKTOTAL, CKMB, CKMBINDEX, TROPONINI in the last 168 hours.  BNP: Invalid input(s): POCBNP  CBG: Recent  Labs  Lab 10/02/17 0830 10/02/17 1149 10/02/17 1817 10/02/17 2115 10/03/17 0800  GLUCAP 121* 154* 128* 214* 177*    Microbiology: Results for orders placed or performed during the hospital encounter of 09/30/17  Wound or Superficial Culture     Status: None (Preliminary result)   Collection Time: 09/30/17  3:10 PM  Result Value Ref Range Status   Specimen Description   Final    TOE LEFT Performed at [facility], [address], [place], [place] [address]    Special Requests   Final    NONE Performed at [facility], [address] [place], [place] [address]    Gram Stain   Final    RARE WBC PRESENT, PREDOMINANTLY MONONUCLEAR RARE GRAM POSITIVE COCCI    Culture   Final    FEW STAPHYLOCOCCUS AUREUS SUSCEPTIBILITIES TO FOLLOW Performed at [facility], [place] [address], [place], [place] [address]    Report Status PENDING  Incomplete  MRSA PCR Screening     Status: None   Collection Time: 09/30/17  7:55 PM  Result Value Ref Range Status   MRSA by PCR NEGATIVE NEGATIVE Final    Comment:        The GeneXpert MRSA Assay (FDA approved for NASAL specimens only), is one component of a comprehensive MRSA colonization surveillance program. It is not intended to diagnose  MRSA infection nor to guide or monitor treatment for MRSA infections. Performed at [facility], [address] [place], [place] [address]   Aerobic/Anaerobic Culture (surgical/deep wound)     Status: None (Preliminary result)   Collection Time: 10/02/17  8:02 AM  Result Value Ref Range Status   Specimen Description   Final    BONE Performed at [facility], [address], [place], [place] [address]    Special Requests   Final    GREAT TOE Performed at [facility], [place] [place], [place] [address]    Gram Stain   Final    RARE WBC PRESENT, PREDOMINANTLY MONONUCLEAR NO ORGANISMS SEEN Performed at [facility], [place] [address] [place], [place] [address]    Culture PENDING  Incomplete   Report Status PENDING  Incomplete    Coagulation Studies: Recent Labs    09/30/17 1555  LABPROT 12.4  INR 0.93    Urinalysis: No results for input(s): COLORURINE, LABSPEC, PHURINE, GLUCOSEU, HGBUR, BILIRUBINUR, KETONESUR, PROTEINUR, UROBILINOGEN, NITRITE, LEUKOCYTESUR in the last 72 hours.  Invalid input(s): APPERANCEUR    Imaging: No results found.   Medications:   . piperacillin-tazobactam (ZOSYN)  IV Stopped (10/03/17 0554)  . vancomycin Stopped (10/02/17 1700)   . aspirin EC  81 mg Oral Daily  . atorvastatin  20 mg Oral QHS  . cholecalciferol  400 Units Oral Daily  . famotidine  20 mg Oral BID  . FLUoxetine  20 mg Oral QHS  . heparin  5,000 Units Subcutaneous Q8H  . insulin aspart  0-9 Units Subcutaneous TID WC  . lipase/protease/amylase  36,000 Units Oral TID WC  . metoCLOPramide  5 mg Oral TID WC & HS  . multivitamin  1 tablet Oral Daily  . omega-3 acid ethyl esters  1 g Oral Daily  . pantoprazole  40 mg Oral Daily  . ranolazine  1,000 mg Oral BID  . rifaximin  550 mg Oral BID  . sevelamer carbonate  800 mg Oral TID WC  . sucralfate  1 g Oral TID WC & HS  . cyanocobalamin  1,000 mcg Oral Q lunch   acetaminophen, albuterol, alum & mag hydroxide-simeth, diphenhydrAMINE, docusate sodium, HYDROcodone-acetaminophen, hydrOXYzine, meclizine, morphine injection, nitroGLYCERIN, oxyCODONE-acetaminophen, promethazine  Assessment/ Plan:  Ms. ELPIDIA KARN is a 57 y.o. black female with diabetes, hypertension, hyperlipidemia, diabetic retinopathy, diabetic neuropathy, diabetic gastroparesis, pancreatic insufficiency, depression, GERD. Renal masses, M-spike   MWF [facility] [place], Right AVG  1. End Stage Renal Disease: hemodialysis MWF. Treatment tolerated yesterday  2. Hypertension: history of difficult to control. Blood pressure at goal.  - amlodipine and carvedilol are her outpatient medications.   3.  Anemia of chronic kidney disease: hemoglobin 9.8 - Mircera as outpatient.   4. Secondary Hyperparathyroidism with hyperphosphatemia: with hypocalcemia - Continue sevelamer    LOS: 3 [name] 1/12/201911:40 AM

## 2017-10-04 LAB — GLUCOSE, CAPILLARY
Glucose-Capillary: 130 mg/dL — ABNORMAL HIGH (ref 65–99)
Glucose-Capillary: 166 mg/dL — ABNORMAL HIGH (ref 65–99)
Glucose-Capillary: 137 mg/dL — ABNORMAL HIGH (ref 65–99)
Glucose-Capillary: 111 mg/dL — ABNORMAL HIGH (ref 65–99)

## 2017-10-04 LAB — CBC
HCT: 29.3 % — ABNORMAL LOW (ref 35.0–47.0)
Hemoglobin: 9.4 g/dL — ABNORMAL LOW (ref 12.0–16.0)
MCH: 29.7 pg (ref 26.0–34.0)
MCHC: 32.3 g/dL (ref 32.0–36.0)
MCV: 92.1 fL (ref 80.0–100.0)
Platelets: 166 10*3/uL (ref 150–440)
RBC: 3.18 MIL/uL — ABNORMAL LOW (ref 3.80–5.20)
RDW: 14.9 % — ABNORMAL HIGH (ref 11.5–14.5)
WBC: 4.5 10*3/uL (ref 3.6–11.0)

## 2017-10-04 MED ORDER — ONDANSETRON HCL 4 MG/2ML IJ SOLN
4.0000 mg | Freq: Four times a day (QID) | INTRAMUSCULAR | Status: DC | PRN
  Administered 2017-10-05 – 2017-10-06 (×2): 4 mg via INTRAVENOUS
  Filled 2017-10-04 (×3): qty 2

## 2017-10-04 MED ORDER — PROMETHAZINE HCL 25 MG/ML IJ SOLN
12.5000 mg | Freq: Four times a day (QID) | INTRAMUSCULAR | Status: DC | PRN
  Administered 2017-10-05 – 2017-10-06 (×5): 25 mg via INTRAVENOUS
  Filled 2017-10-04 (×6): qty 1

## 2017-10-04 NOTE — Progress Notes (Signed)
[facility]  ROUNDING NOTE   Subjective:   Pain well controlled. Resting comfortably in bed.   Objective:  Vital signs in last 24 hours:  Temp:  [98 F (36.7 C)-98.7 F (37.1 C)] 98.7 F (37.1 C) (01/13 0834) Pulse Rate:  [70-73] 72 (01/13 0834) Resp:  [18] 18 (01/13 0834) BP: (142-160)/(49-57) 156/55 (01/13 0834) SpO2:  [95 %-100 %] 95 % (01/13 0834)  Weight change:  Filed Weights   09/30/17 1453 10/02/17 1410  Weight: 83.6 kg (184 lb 6.4 oz) 98.1 kg (216 lb 4.3 oz)    Intake/Output: I/O last 3 completed shifts: In: 360 [P.O.:360] Out: -    Intake/Output this shift:  Total I/O In: 240 [P.O.:240] Out: -   Physical Exam: General: NAD,   Head: Normocephalic, atraumatic. Moist oral mucosal membranes  Eyes: Anicteric, PERRL  Neck: Supple, trachea midline  Lungs:  Clear to auscultation  Heart: Regular rate and rhythm  Abdomen:  Soft, nontender,   Extremities: no peripheral edema. Left foot in clean dressings  Neurologic: Nonfocal, moving all four extremities  Skin: No lesions  Access: Right AVG    Basic Metabolic Panel: Recent Labs  Lab 09/30/17 1555 10/01/17 0826  NA 133* 135  K 3.9 4.3  CL 95* 96*  CO2 28 28  GLUCOSE 191* 101*  BUN 8 10  CREATININE 2.91* 4.32*  CALCIUM 7.8* 7.7*    Liver Function Tests: No results for input(s): AST, ALT, ALKPHOS, BILITOT, PROT, ALBUMIN in the last 168 hours. No results for input(s): LIPASE, AMYLASE in the last 168 hours. No results for input(s): AMMONIA in the last 168 hours.  CBC: Recent Labs  Lab 09/30/17 1555 10/01/17 0826 10/04/17 0424  WBC 5.8 5.1 4.5  NEUTROABS 2.8  --   --   HGB 10.4* 9.8* 9.4*  HCT 32.0* 30.8* 29.3*  MCV 92.4 92.5 92.1  PLT 154 165 166    Cardiac Enzymes: No results for input(s): CKTOTAL, CKMB, CKMBINDEX, TROPONINI in the last 168 hours.  BNP: Invalid input(s): POCBNP  CBG: Recent Labs  Lab 10/03/17 1140 10/03/17 1213 10/03/17 1633 10/03/17 2019  10/04/17 0734  GLUCAP 64* 74 177* 163* 130*    Microbiology: Results for orders placed or performed during the hospital encounter of 09/30/17  Wound or Superficial Culture     Status: None   Collection Time: 09/30/17  3:10 PM  Result Value Ref Range Status   Specimen Description   Final    TOE LEFT Performed at [facility], [address], [place], [place] [address]    Special Requests   Final    NONE Performed at [facility], [address], [place], [place] [address]    Gram Stain   Final    RARE WBC PRESENT, PREDOMINANTLY MONONUCLEAR RARE GRAM POSITIVE COCCI Performed at [facility], [place] [address], [place], [place] [address]    Culture FEW STAPHYLOCOCCUS AUREUS  Final   Report Status 10/03/2017 FINAL  Final   Organism ID, Bacteria STAPHYLOCOCCUS AUREUS  Final      Susceptibility   Staphylococcus aureus - MIC*    CIPROFLOXACIN <=0.5 SENSITIVE Sensitive     ERYTHROMYCIN >=8 RESISTANT Resistant     GENTAMICIN <=0.5 SENSITIVE Sensitive     OXACILLIN 0.5 SENSITIVE Sensitive     TETRACYCLINE <=1 SENSITIVE Sensitive     VANCOMYCIN 1 SENSITIVE Sensitive     TRIMETH/SULFA <=10 SENSITIVE Sensitive     CLINDAMYCIN <=0.25 SENSITIVE Sensitive  RIFAMPIN <=0.5 SENSITIVE Sensitive     Inducible Clindamycin NEGATIVE Sensitive     * FEW STAPHYLOCOCCUS AUREUS  MRSA PCR Screening     Status: None   Collection Time: 09/30/17  7:55 PM  Result Value Ref Range Status   MRSA by PCR NEGATIVE NEGATIVE Final    Comment:        The GeneXpert MRSA Assay (FDA approved for NASAL specimens only), is one component of a comprehensive MRSA colonization surveillance program. It is not intended to diagnose MRSA infection nor to guide or monitor treatment for MRSA infections. Performed at [facility], [address], [place], [place] [address]   Aerobic/Anaerobic Culture (surgical/deep wound)     Status: None (Preliminary result)   Collection Time:  10/02/17  8:02 AM  Result Value Ref Range Status   Specimen Description   Final    BONE Performed at [facility], [address], [place], [place] [address]    Special Requests   Final    GREAT TOE Performed at [facility], [place], [place] [address]    Gram Stain   Final    RARE WBC PRESENT, PREDOMINANTLY MONONUCLEAR NO ORGANISMS SEEN    Culture   Final    FEW STAPHYLOCOCCUS AUREUS SUSCEPTIBILITIES PERFORMED ON PREVIOUS CULTURE WITHIN THE LAST 5 DAYS. FEW STAPHYLOCOCCUS LUGDUNENSIS CRITICAL RESULT CALLED TO, READ BACK BY AND VERIFIED WITH: C GROGAN,RN AT 1230 10/03/17 BY L BENFIELD CONCERNING GROWTH ON CULTURE Performed at [facility], [place] [address], [place],  [address]    Report Status PENDING  Incomplete   Organism ID, Bacteria STAPHYLOCOCCUS LUGDUNENSIS  Final      Susceptibility   Staphylococcus lugdunensis - MIC*    CIPROFLOXACIN <=0.5 SENSITIVE Sensitive     ERYTHROMYCIN >=8 RESISTANT Resistant     GENTAMICIN <=0.5 SENSITIVE Sensitive     OXACILLIN 0.5 SENSITIVE Sensitive     TETRACYCLINE <=1 SENSITIVE Sensitive     VANCOMYCIN <=0.5 SENSITIVE Sensitive     TRIMETH/SULFA <=10 SENSITIVE Sensitive     CLINDAMYCIN >=8 RESISTANT Resistant     RIFAMPIN <=0.5 SENSITIVE Sensitive     Inducible Clindamycin NEGATIVE Sensitive     * FEW STAPHYLOCOCCUS LUGDUNENSIS    Coagulation Studies: No results for input(s): LABPROT, INR in the last 72 hours.  Urinalysis: No results for input(s): COLORURINE, LABSPEC, PHURINE, GLUCOSEU, HGBUR, BILIRUBINUR, KETONESUR, PROTEINUR, UROBILINOGEN, NITRITE, LEUKOCYTESUR in the last 72 hours.  Invalid input(s): APPERANCEUR    Imaging: Dg Abd 2 Views  Result Date: 10/03/2017 CLINICAL DATA:  Nausea and vomiting. Status post foot surgery yesterday. EXAM: ABDOMEN - 2 VIEW COMPARISON:  Abdomen and pelvis CT dated 09/03/2017. FINDINGS: Normal bowel gas pattern without free peritoneal air.  Cholecystectomy clips. Arterial calcifications. Unremarkable bones. IMPRESSION: No acute abnormality. Electronically Signed   By: Claudie Revering M.D.   On: 10/03/2017 16:35     Medications:   . vancomycin Stopped (10/02/17 1700)   . aspirin EC  81 mg Oral Daily  . atorvastatin  20 mg Oral QHS  . cholecalciferol  400 Units Oral Daily  . famotidine  20 mg Oral BID  . FLUoxetine  20 mg Oral QHS  . heparin  5,000 Units Subcutaneous Q8H  . insulin aspart  0-9 Units Subcutaneous TID WC  . lipase/protease/amylase  36,000 Units Oral TID WC  . metoCLOPramide  5 mg Oral TID WC & HS  . multivitamin  1 tablet Oral Daily  . omega-3  acid ethyl esters  1 g Oral Daily  . pantoprazole  40 mg Oral Daily  . ranolazine  1,000 mg Oral BID  . rifaximin  550 mg Oral BID  . sevelamer carbonate  800 mg Oral TID WC  . sucralfate  1 g Oral TID WC & HS  . cyanocobalamin  1,000 mcg Oral Q lunch   acetaminophen, albuterol, alum & mag hydroxide-simeth, diphenhydrAMINE, docusate sodium, HYDROcodone-acetaminophen, hydrOXYzine, meclizine, morphine injection, nitroGLYCERIN, oxyCODONE-acetaminophen, promethazine  Assessment/ Plan:  [name] is a 57 y.o. black female with diabetes, hypertension, hyperlipidemia, diabetic retinopathy, diabetic neuropathy, diabetic gastroparesis, pancreatic insufficiency, depression, GERD. Renal masses, M-spike   MWF [facility] [place], Right AVG  1. End Stage Renal Disease: hemodialysis MWF. HD treatment for tomorrow.   2. Hypertension: history of difficult to control. Blood pressure 156/55 - amlodipine and carvedilol are her outpatient medications.   3. Anemia of chronic kidney disease: hemoglobin 9.4 - Mircera as outpatient.   4. Secondary Hyperparathyroidism with hyperphosphatemia and hypocalcemia - Continue sevelamer    LOS: 4 Dorion Petillo 1/13/201910:58 AM

## 2017-10-04 NOTE — Progress Notes (Signed)
Patient Demographics  [name], is a 57 y.o. female   MRN: [id number]   DOB - 14-Oct-1960  Admit Date - 09/30/2017    Outpatient Primary MD for the patient is Robin Finner, NP  Consult requested in the Hospital by Robin Harms, MD, On 10/04/2017   With History of -  Past Medical History:  Diagnosis Date  . Anemia   . Anginal pain ([place])   . Anxiety   . Arthritis   . Asthma   . Broken wrist   . Bronchitis   . chronic diastolic CHF 3/87/5643  . COPD (chronic obstructive pulmonary disease) ([name])   . Coronary artery disease    a. cath 2013: stenting to RCA (report not available); b. cath 2014: LM nl, pLAD 40%, mLAD nl, ost LCx 40%, mid LCx nl, pRCA 30% @ site of prior stent, mRCA 50%  . Depression   . Diabetes mellitus without complication ([name])   . Diabetic neuropathy ([name])   . dialysis 2006  . Diverticulosis   . Dizziness   . Dyspnea   . Elevated lipids   . Environmental and seasonal allergies   . ESRD (end stage renal disease) on dialysis ([place])    M-W-F  . GERD (gastroesophageal reflux disease)   . Headache   . History of hiatal hernia   . HOH (hard of hearing)   . Hx of pancreatitis 2015  . Hypertension   . Lower extremity edema   . Mitral regurgitation    a. echo 10/2013: EF 62%, noWMA, mildly dilated LA, mild to mod MR/TR, GR1DD  . Myocardial infarction ([name])   . Orthopnea   . Pneumonia   . Renal cancer ([name])   . Renal insufficiency    Pt is on dialysis on M,W + F.  . Wheezing       Past Surgical History:  Procedure Laterality Date  . ABDOMINAL HYSTERECTOMY  1992  . AMPUTATION TOE Left 10/02/2017   Procedure: AMPUTATION TOE-LEFT GREAT TOE;  Surgeon: Albertine Patricia, DPM;  Location: [facility];  Service: Podiatry;  Laterality: Left;  . APPENDECTOMY    . CARDIAC CATHETERIZATION Left 07/26/2015   Procedure: Left Heart Cath and Coronary Angiography;  Surgeon: Dionisio David, MD;  Location:  [place] [facility];  Service: Cardiovascular;  Laterality: Left;  . CATARACT EXTRACTION W/ INTRAOCULAR LENS IMPLANT Right   . CATARACT EXTRACTION W/PHACO Left 03/10/2017   Procedure: CATARACT EXTRACTION PHACO AND INTRAOCULAR LENS PLACEMENT (IOC);  Surgeon: Birder Robson, MD;  Location: [facility] ORS;  Service: Ophthalmology;  Laterality: Left;  [name] 00:51.9 AP% 14.2 CDE 7.39 fluid pack lot # [id number]  . CHOLECYSTECTOMY    . COLONOSCOPY WITH PROPOFOL N/A 08/12/2016   Procedure: COLONOSCOPY WITH PROPOFOL;  Surgeon: Lollie Sails, MD;  Location: [facility] ENDOSCOPY;  Service: Endoscopy;  Laterality: N/A;  . DIALYSIS FISTULA CREATION Left    upper arm  . dialysis grafts    . ESOPHAGOGASTRODUODENOSCOPY N/A 03/08/2015   Procedure: ESOPHAGOGASTRODUODENOSCOPY (EGD);  Surgeon: Manya Silvas, MD;  Location: [facility] ENDOSCOPY;  Service: Endoscopy;  Laterality: N/A;  . ESOPHAGOGASTRODUODENOSCOPY (EGD) WITH PROPOFOL N/A 03/18/2016   Procedure: ESOPHAGOGASTRODUODENOSCOPY (EGD) WITH PROPOFOL;  Surgeon: [name], MD;  Location: [facility] ENDOSCOPY;  Service: Endoscopy;  Laterality: N/A;  . EYE SURGERY Right 2018  . FECAL TRANSPLANT N/A 08/23/2015   Procedure: FECAL TRANSPLANT;  Surgeon: Manya Silvas, MD;  Location: [name]OS[facility];  Service: Endoscopy;  Laterality: N/A;  . HAND SURGERY Bilateral   .  PERIPHERAL VASCULAR CATHETERIZATION N/A 12/20/2015   Procedure: Thrombectomy of dialysis access versus permcath placement;  Surgeon: Algernon Huxley, MD;  Location: [place] [facility];  Service: Cardiovascular;  Laterality: N/A;  . PERIPHERAL VASCULAR CATHETERIZATION N/A 12/20/2015   Procedure: A/V Shunt Intervention;  Surgeon: Algernon Huxley, MD;  Location: [place] [facility];  Service: Cardiovascular;  Laterality: N/A;  . PERIPHERAL VASCULAR CATHETERIZATION N/A 12/20/2015   Procedure: A/V Shuntogram/Fistulagram;  Surgeon: Algernon Huxley, MD;  Location: [place] [facility];  Service: Cardiovascular;  Laterality: N/A;  .  PERIPHERAL VASCULAR CATHETERIZATION N/A 01/02/2016   Procedure: A/V Shuntogram/Fistulagram;  Surgeon: Algernon Huxley, MD;  Location: [place] [facility];  Service: Cardiovascular;  Laterality: N/A;  . PERIPHERAL VASCULAR CATHETERIZATION N/A 01/02/2016   Procedure: A/V Shunt Intervention;  Surgeon: Algernon Huxley, MD;  Location: [place] [facility];  Service: Cardiovascular;  Laterality: N/A;    in for   Chief Complaint  Patient presents with  . Osteomyelitis      HPI  [name]  is a 57 y.o. female, status post 2 days since amputation distal phalanx of the left hallux.  She doing well with this.  She had a chronic ulceration on the region that eventually led to osteomyelitis.  She was hospitalized with some swelling and cellulitis to the foot and leg which has resolved for the most part and she is doing very well.    Review of Systems: She is alert well oriented no apparent distress  In addition to the HPI above,  No Fever-chills, No Headache, No changes with Vision or hearing, No problems swallowing food or Liquids, No Chest pain, Cough or Shortness of Breath, No Abdominal pain, No Nausea or Vommitting, Bowel movements are regular, No Blood in stool or Urine, No dysuria, No Robin skin rashes or bruises, No Robin joints pains-aches,  No Robin weakness, tingling, numbness in any extremity, No recent weight gain or loss, No polyuria, polydypsia or polyphagia, No significant Mental Stressors.  A full 10 point Review of Systems was done, except as stated above, all other Review of Systems were negative.   Social History Social History   Tobacco Use  . Smoking status: Former Smoker    Packs/day: 0.50    Years: 40.00    Pack years: 20.00    Types: Cigarettes    Last attempt to quit: 02/13/2015    Years since quitting: 2.6  . Smokeless tobacco: Never Used  Substance Use Topics  . Alcohol use: Yes    Alcohol/week: 0.6 oz    Types: 1 Glasses of wine per week    Comment: glass wine  week per pt  Family History Family History  Problem Relation Age of Onset  . Kidney disease Mother   . Diabetes Mother   . Cancer Father   . Kidney disease Sister    Prior to Admission medications   Medication Sig Start Date End Date Taking? Authorizing Provider  albuterol (PROVENTIL HFA;VENTOLIN HFA) 108 (90 Base) MCG/ACT inhaler Inhale 2 puffs into the lungs every 6 (six) hours as needed for shortness of breath.    Yes [provider]  albuterol (PROVENTIL) (2.5 MG/3ML) 0.083% nebulizer solution Take 2.5 mg by nebulization every 6 (six) hours as needed for wheezing or shortness of breath.   Yes [provider]  alum & mag hydroxide-simeth (MAALOX MAX) 400-400-40 MG/5ML suspension Take 5 mLs by mouth every 6 (six) hours as needed for indigestion. 06/09/17  Yes Alfred Levins,  [place], MD  amLODipine (NORVASC) 10 MG tablet Take 10 mg by mouth daily.   Yes [provider]  aspirin EC 81 MG tablet Take 81 mg by mouth daily. 09/08/16  Yes [provider]  atorvastatin (LIPITOR) 20 MG tablet Take 20 mg by mouth at bedtime.  12/02/16  Yes [provider]  carvedilol (COREG) 25 MG tablet Take 25 mg by mouth 2 (two) times daily with a meal.   Yes [provider]  cetirizine (ZYRTEC) 10 MG tablet Take 10 mg by mouth daily.    Yes [provider]  cholecalciferol (VITAMIN D) 400 units TABS tablet Take 400 Units by mouth daily.   Yes [provider]  ciprofloxacin (CIPRO) 500 MG tablet Take 500 mg by mouth 2 (two) times daily. 09/29/17 10/05/2017 Yes [provider]  clindamycin (CLEOCIN) 300 MG capsule Take 300 mg by mouth 2 (two) times daily. 09/29/17 10/02/2017 Yes [provider]  clopidogrel (PLAVIX) 75 MG tablet Take 1 tablet (75 mg total) by mouth daily. 03/25/17 03/25/18 Yes Dustin Flock, MD  cyanocobalamin 1000 MCG tablet Take 1,000 mcg by mouth daily with lunch.    Yes [provider]  dexlansoprazole (DEXILANT)  60 MG capsule Take 60 mg by mouth daily.   Yes [provider]  diphenhydrAMINE (BENADRYL) 25 mg capsule Take 25 mg by mouth every 6 (six) hours as needed for itching.   Yes [provider]  FLUoxetine (PROZAC) 20 MG capsule Take 1 capsule (20 mg total) by mouth daily. Patient taking differently: Take 20 mg by mouth at bedtime.  04/10/16  Yes Hower, Aaron Mose, MD  glucose 4 GM chewable tablet Chew 3-4 tablets by mouth as needed for low blood sugar (blood sugar less than 60).    Yes [provider]  HYDROcodone-acetaminophen (NORCO/VICODIN) 5-325 MG tablet Take 1 tablet by mouth every 6 (six) hours as needed for moderate pain or severe pain. Patient taking differently: Take 1 tablet by mouth 3 (three) times daily as needed for moderate pain or severe pain.  05/05/17  Yes Gouru, Illene Silver, MD  hydrOXYzine (ATARAX/VISTARIL) 25 MG tablet Take 25 mg by mouth every 6 (six) hours as needed.    Yes [provider]  insulin detemir (LEVEMIR) 100 UNIT/ML injection Inject 0.02 mLs (2 Units total) into the skin 2 (two) times daily. Patient taking differently: Inject 4 Units into the skin at bedtime.  09/05/17  Yes Epifanio Lesches, MD  levofloxacin (LEVAQUIN) 750 MG tablet Take 750 mg by mouth every Monday, Wednesday, and Friday.   Yes [provider]  lipase/protease/amylase (CREON) 12000 UNITS CPEP capsule Take 24,000-36,000 Units by mouth 3 (three) times daily with meals. Take 36000 mg in the morning, 24000 midday & 36000 in the evening   Yes [provider]  meclizine (ANTIVERT) 12.5 MG tablet Take 1 tablet by mouth three times daily as needed   Yes [provider]  metoCLOPramide (REGLAN) 10 MG tablet Take 1 tablet (10 mg total) by mouth every 8 (eight) hours as needed for nausea or vomiting. Patient taking differently: Take 5 mg by mouth 4 (four) times daily -  with meals and at bedtime.  06/08/17  Yes Earleen Newport, MD  multivitamin  (RENA-VIT) TABS tablet Take 1 tablet by mouth daily.    Yes [provider]  nitroGLYCERIN (NITROSTAT) 0.4 MG SL tablet Place 0.4 mg under the tongue every 5 (five) minutes x 3 doses as needed.   Yes [provider]  omega-3 acid ethyl esters (LOVAZA) 1 g capsule Take 1 g by mouth daily.    Yes [provider]  promethazine (PHENERGAN) 12.5 MG tablet Take 12.5 mg by mouth every 6 (six) hours as needed for nausea or vomiting.   Yes [provider]  ranitidine (ZANTAC) 150 MG tablet Take 150 mg by mouth 2 (two) times daily. In the afternoon   Yes [provider]  ranolazine (RANEXA) 500 MG 12 hr tablet Take 1,000 mg by mouth 2 (two) times daily.   Yes [provider]  rifaximin (XIFAXAN) 550 MG TABS tablet Take 550 mg by mouth 2 (two) times daily.   Yes [provider]  sevelamer carbonate (RENVELA) 800 MG tablet Take 800 mg by mouth 3 (three) times daily with meals.   Yes [provider]  sucralfate (CARAFATE) 1 g tablet Take 1 g by mouth 4 (four) times daily -  with meals and at bedtime.   Yes [provider]  famotidine (PEPCID) 20 MG tablet Take 1 tablet (20 mg total) by mouth 2 (two) times daily. Patient not taking: Reported on 09/30/2017 06/09/17 06/09/18  Rudene Re, MD    Anti-infectives (From admission, onward)   Start     Dose/Rate Route Frequency Ordered Stop   10/02/17 1200  vancomycin (VANCOCIN) IVPB 1000 mg/200 mL premix     1,000 mg 200 mL/hr over 60 Minutes Intravenous Every M-W-F (Hemodialysis) 09/30/17 1635     10/01/17 1200  rifaximin (XIFAXAN) tablet 550 mg     550 mg Oral 2 times daily 10/01/17 1051     10/01/17 0200  piperacillin-tazobactam (ZOSYN) IVPB 3.375 g  Status:  Discontinued     3.375 g 12.5 mL/hr over 240 Minutes Intravenous Every 12 hours 09/30/17 1635 10/03/17 1351   09/30/17 1730  vancomycin (VANCOCIN) 2,000 mg in sodium chloride 0.9 % 500 mL IVPB     2,000 mg 250 mL/hr over  120 Minutes Intravenous  Once 09/30/17 1631 09/30/17 1937   09/30/17 1645  vancomycin (VANCOCIN) 2,000 mg in sodium chloride 0.9 % 500 mL IVPB  Status:  Discontinued     2,000 mg 250 mL/hr over 120 Minutes Intravenous  Once 09/30/17 1631 09/30/17 1631   09/30/17 1515  piperacillin-tazobactam (ZOSYN) IVPB 3.375 g     3.375 g 100 mL/hr over 30 Minutes Intravenous  Once 09/30/17 1507 09/30/17 1703   09/30/17 1515  vancomycin (VANCOCIN) IVPB 1000 mg/200 mL premix  Status:  Discontinued     1,000 mg 200 mL/hr over 60 Minutes Intravenous  Once 09/30/17 1507 09/30/17 1631      Scheduled Meds: . aspirin EC  81 mg Oral Daily  . atorvastatin  20 mg Oral QHS  . cholecalciferol  400 Units Oral Daily  . famotidine  20 mg Oral BID  . FLUoxetine  20 mg Oral QHS  . heparin  5,000 Units Subcutaneous Q8H  . insulin aspart  0-9 Units Subcutaneous TID WC  . lipase/protease/amylase  36,000 Units Oral TID WC  . multivitamin  1 tablet Oral Daily  . omega-3 acid ethyl esters  1 g Oral Daily  . pantoprazole  40 mg Oral Daily  . ranolazine  1,000 mg Oral BID  . rifaximin  550 mg Oral BID  . sevelamer carbonate  800 mg Oral TID WC  . sucralfate  1 g Oral TID WC & HS  . cyanocobalamin  1,000 mcg Oral Q lunch   Continuous Infusions: . vancomycin Stopped (10/02/17  1700)   PRN Meds:.acetaminophen, albuterol, alum & mag hydroxide-simeth, diphenhydrAMINE, docusate sodium, HYDROcodone-acetaminophen, hydrOXYzine, meclizine, morphine injection, nitroGLYCERIN, oxyCODONE-acetaminophen, promethazine  Allergies  Allergen Reactions  . Ace Inhibitors Swelling and Anaphylaxis  . Ativan [Lorazepam] Other (See Comments)    Reaction:Hallucinations and headaches Reaction:Hallucinations and headaches Reaction:  Hallucinations and headaches  . Compazine [Prochlorperazine Edisylate] Anaphylaxis and Nausea And Vomiting    Other reaction(s): dystonia from this vs. reglan 23 Jul - patient relates that she takes  promethazine frequently with no problems. 23 Jul - patient relates that she takes promethazine frequently with no problems.  . Sumatriptan Succinate     Other reaction(s): delirium and hallucinations per [facility] records  . Dilaudid [Hydromorphone Hcl] Other (See Comments)    Delirium   . Ondansetron Other (See Comments)    hallucinations    . Zofran [Ondansetron Hcl] Other (See Comments)    Reaction:  hallucinations  Reaction:  hallucinations   . Codeine Nausea And Vomiting  . Gabapentin Other (See Comments)    Reaction:  Unknown    . Lac Bovis Nausea And Vomiting  . Losartan Nausea Only  . Oxycodone Anxiety  . Prochlorperazine Other (See Comments)    Reaction:  Unknown . Patient does not remember reaction but she does have vertigo and anxiety along with n and v at times. Could be used to treat any of these   . Reglan [Metoclopramide]     Per patient her Dr. Evelina Bucy her off it  Per patient her Dr. Evelina Bucy her off it   . Scopolamine Other (See Comments)    Dizziness, also has vertigo already  . Tape Rash  . Tapentadol Rash    Physical Exam  Vitals  Blood pressure (!) 156/55, pulse 72, temperature 98.7 F (37.1 C), temperature source Oral, resp. rate 18, height 5' 5.5" (1.664 m), weight 98.1 kg (216 lb 4.3 oz), SpO2 95 %.  Lower Extremity exam dressing change today and the incision margins look very good.  No evidence of infection or drainage to the wounds.  Incision margins are intact and stable.  Cellulitis redness is pretty much resolved to the foot and lower leg.: Data Review  CBC Recent Labs  Lab 09/30/17 1555 10/01/17 0826 10/04/17 0424  WBC 5.8 5.1 4.5  HGB 10.4* 9.8* 9.4*  HCT 32.0* 30.8* 29.3*  PLT 154 165 166  MCV 92.4 92.5 92.1  MCH 30.0 29.4 29.7  MCHC 32.4 31.8* 32.3  RDW 14.6* 14.9* 14.9*  LYMPHSABS 1.8  --   --   MONOABS 0.8  --   --   EOSABS 0.4  --   --   BASOSABS 0.0  --   --     ------------------------------------------------------------------------------------------------------------------  Chemistries  Recent Labs  Lab 09/30/17 1555 10/01/17 0826  NA 133* 135  K 3.9 4.3  CL 95* 96*  CO2 28 28  GLUCOSE 191* 101*  BUN 8 10  CREATININE 2.91* 4.32*  CALCIUM 7.8* 7.7*   ---------------------------------------------------------------------------------------  Assessment & Plan: Overall the patient is doing well and stable.  I redressed the area today and I want to just leave this on until she comes back to my office a week from tomorrow.  Of asked for an infectious disease consult to determine antibiotics for her release.  I think she is in a position where she could probably use oral antibiotics due to my resection of an all infected soft tissue and bone.  Will let Dr. Ola Spurr evaluate her first.  Also let her  start to have bathroom privileges at this point but she needs to wear the postop shoe when she gets up and have some assistance and also use her cane that she has.  Otherwise hopefully she will be able to be discharged tomorrow.  Principal Problem:   Osteomyelitis [facility])  Family Communication: Plan discussed with patient    Albertine Patricia M.D on 10/04/2017 at 2:13 PM  Thank you for the consult, we will follow the patient with you in the Hospital.

## 2017-10-04 NOTE — Progress Notes (Signed)
[place] [facility] [place] NAME: [name]    MR#:  [id number]  DATE OF BIRTH:  02-Mar-1961  SUBJECTIVE:  CHIEF COMPLAINT:   Chief Complaint  Patient presents with  . Osteomyelitis    Complains of nausea/emesis, acute abdominal series unimpressive, multiple family members at the bedside, for hemodialysis on tomorrow, cultures noted REVIEW OF SYSTEMS:  CONSTITUTIONAL: No fever, fatigue or weakness.  EYES: No blurred or double vision.  EARS, NOSE, AND THROAT: No tinnitus or ear pain.  RESPIRATORY: No cough, shortness of breath, wheezing or hemoptysis.  CARDIOVASCULAR: No chest pain, orthopnea, edema.  GASTROINTESTINAL: No nausea, vomiting, diarrhea or abdominal pain.  GENITOURINARY: No dysuria, hematuria.  ENDOCRINE: No polyuria, nocturia,  HEMATOLOGY: No anemia, easy bruising or bleeding SKIN: No rash or lesion. MUSCULOSKELETAL: No joint pain or arthritis.   NEUROLOGIC: No tingling, numbness, weakness.  PSYCHIATRY: No anxiety or depression.   ROS  DRUG ALLERGIES:   Allergies  Allergen Reactions  . Ace Inhibitors Swelling and Anaphylaxis  . Ativan [Lorazepam] Other (See Comments)    Reaction:Hallucinations and headaches Reaction:Hallucinations and headaches Reaction:  Hallucinations and headaches  . Compazine [Prochlorperazine Edisylate] Anaphylaxis and Nausea And Vomiting    Other reaction(s): dystonia from this vs. reglan 23 Jul - patient relates that she takes promethazine frequently with no problems. 23 Jul - patient relates that she takes promethazine frequently with no problems.  . Sumatriptan Succinate     Other reaction(s): delirium and hallucinations per [facility] records  . Dilaudid [Hydromorphone Hcl] Other (See Comments)    Delirium   . Ondansetron Other (See Comments)    hallucinations    . Zofran [Ondansetron Hcl] Other (See Comments)    Reaction:  hallucinations  Reaction:  hallucinations   . Codeine Nausea And  Vomiting  . Gabapentin Other (See Comments)    Reaction:  Unknown    . Lac Bovis Nausea And Vomiting  . Losartan Nausea Only  . Oxycodone Anxiety  . Prochlorperazine Other (See Comments)    Reaction:  Unknown . Patient does not remember reaction but she does have vertigo and anxiety along with n and v at times. Could be used to treat any of these   . Reglan [Metoclopramide]     Per patient her Dr. Evelina Bucy her off it  Per patient her Dr. Evelina Bucy her off it   . Scopolamine Other (See Comments)    Dizziness, also has vertigo already  . Tape Rash  . Tapentadol Rash    VITALS:  Blood pressure (!) 156/55, pulse 72, temperature 98.7 F (37.1 C), temperature source Oral, resp. rate 18, height 5' 5.5" (1.664 m), weight 98.1 kg (216 lb 4.3 oz), SpO2 95 %.  PHYSICAL EXAMINATION:  GENERAL:  57 y.o.-year-old patient lying in the bed with no acute distress.  EYES: Pupils equal, round, reactive to light and accommodation. No scleral icterus. Extraocular muscles intact.  HEENT: Head atraumatic, normocephalic. Oropharynx and nasopharynx clear.  NECK:  Supple, no jugular venous distention. No thyroid enlargement, no tenderness.  LUNGS: Normal breath sounds bilaterally, no wheezing, rales,rhonchi or crepitation. No use of accessory muscles of respiration.  CARDIOVASCULAR: S1, S2 normal. No murmurs, rubs, or gallops.  ABDOMEN: Soft, nontender, nondistended. Bowel sounds present. No organomegaly or mass.  EXTREMITIES: left lower extremity foot/leg with dressing clean/dry/intact   NEUROLOGIC: Cranial nerves II through XII are intact. MAES. Gait not checked.  PSYCHIATRIC: The patient is alert and oriented x 3.  SKIN: No obvious  rash, lesion, or ulcer.   Physical Exam LABORATORY PANEL:   CBC Recent Labs  Lab 10/04/17 0424  WBC 4.5  HGB 9.4*  HCT 29.3*  PLT 166   ------------------------------------------------------------------------------------------------------------------  Chemistries  Recent  Labs  Lab 10/01/17 0826  NA 135  K 4.3  CL 96*  CO2 28  GLUCOSE 101*  BUN 10  CREATININE 4.32*  CALCIUM 7.7*   ------------------------------------------------------------------------------------------------------------------  Cardiac Enzymes No results for input(s): TROPONINI in the last 168 hours. ------------------------------------------------------------------------------------------------------------------  RADIOLOGY:  Dg Abd 2 Views  Result Date: 10/03/2017 CLINICAL DATA:  Nausea and vomiting. Status post foot surgery yesterday. EXAM: ABDOMEN - 2 VIEW COMPARISON:  Abdomen and pelvis CT dated 09/03/2017. FINDINGS: Normal bowel gas pattern without free peritoneal air. Cholecystectomy clips. Arterial calcifications. Unremarkable bones. IMPRESSION: No acute abnormality. Electronically Signed   By: Claudie Revering M.D.   On: 10/03/2017 16:35    ASSESSMENT AND PLAN:  1acute left great toe osteomyelitis Resolving Continue empiric IV vancomycin, wound cultures noted for Staph aureus/lugdunensis-sensitivities noted, antibiotics per ID,s/pleft great toe amputation 10/02/2017 by podiatry   2chronic diabetes mellitus type 2  Stable on current regiment  3chronic benign essential hypertension  Stable  Normotensive without blood pressure medication   4chronic ESRD Stable On hemodialysisMWF Nephrology input greatly appreciated-for dialysis on tomorrow   5history of CAD Stable Continue to holdplavix givenrecent surgery  6acute right knee pain  Stable  X-rays negative for acute fracture orthopedic surgery input greatly appreciated-follow-up as an outpatient for possibleSynvisc injection  7 acute nausea/emesis Resolving Continue antiemetics PRN Acute abdominal series unimpressive  All the records are reviewed and case discussed with Care Management/Social Workerr. Management plans discussed with the patient, family and they are in agreement.  CODE  STATUS: full   TOTAL TIME TAKING CARE OF THIS PATIENT: 35 minutes.     POSSIBLE D/C IN 1-2 DAYS, DEPENDING ON CLINICAL CONDITION.   Avel Peace Pearlena Ow M.D on 10/04/2017   Between 7am to 6pm - Pager - [phone]  After 6pm go to [email or web address] - password [email or web address] [facility]  Office  [phone]  CC: Primary care physician; Freddy Finner, NP  Note: This dictation was prepared with Dragon dictation along with smaller phrase technology. Any transcriptional errors that result from this process are unintentional.

## 2017-10-05 ENCOUNTER — Encounter: Payer: Self-pay | Admitting: Podiatry

## 2017-10-05 LAB — GLUCOSE, CAPILLARY
Glucose-Capillary: 116 mg/dL — ABNORMAL HIGH (ref 65–99)
Glucose-Capillary: 78 mg/dL (ref 65–99)
Glucose-Capillary: 106 mg/dL — ABNORMAL HIGH (ref 65–99)

## 2017-10-05 MED ORDER — DOXYCYCLINE HYCLATE 100 MG PO TABS
100.0000 mg | ORAL_TABLET | Freq: Two times a day (BID) | ORAL | Status: DC
  Administered 2017-10-05 – 2017-10-06 (×2): 100 mg via ORAL
  Filled 2017-10-05 (×4): qty 1

## 2017-10-05 MED ORDER — EPOETIN ALFA 10000 UNIT/ML IJ SOLN
10000.0000 [IU] | INTRAMUSCULAR | Status: DC
  Administered 2017-10-05: 10000 [IU] via INTRAVENOUS
  Filled 2017-10-05: qty 1

## 2017-10-05 NOTE — Progress Notes (Signed)
Pre HD  

## 2017-10-05 NOTE — Progress Notes (Signed)
Assumed care of patient at 1500. Patient with some dry heaves and vomiting. Patient was selective in the medications that she would take. Given phenergan and patient was able to rest some without heaving and emesis.

## 2017-10-05 NOTE — Progress Notes (Signed)
[facility] [place] NAME: [name]    MR#:  [id number]  DATE OF BIRTH:  07/03/1961  SUBJECTIVE:  CHIEF COMPLAINT:   Chief Complaint  Patient presents with  . Osteomyelitis   Currently receiving hemodialysis, continues to complain of nausea/emesis/inability to tolerate p.o., states unable to go home today, is interested in possible rehab placement, noted anxiety  REVIEW OF SYSTEMS:  CONSTITUTIONAL: No fever, fatigue or weakness.  EYES: No blurred or double vision.  EARS, NOSE, AND THROAT: No tinnitus or ear pain.  RESPIRATORY: No cough, shortness of breath, wheezing or hemoptysis.  CARDIOVASCULAR: No chest pain, orthopnea, edema.  GASTROINTESTINAL: No nausea, vomiting, diarrhea or abdominal pain.  GENITOURINARY: No dysuria, hematuria.  ENDOCRINE: No polyuria, nocturia,  HEMATOLOGY: No anemia, easy bruising or bleeding SKIN: No rash or lesion. MUSCULOSKELETAL: No joint pain or arthritis.   NEUROLOGIC: No tingling, numbness, weakness.  PSYCHIATRY: No anxiety or depression.   ROS  DRUG ALLERGIES:   Allergies  Allergen Reactions  . Ace Inhibitors Swelling and Anaphylaxis  . Ativan [Lorazepam] Other (See Comments)    Reaction:Hallucinations and headaches Reaction:Hallucinations and headaches Reaction:  Hallucinations and headaches  . Compazine [Prochlorperazine Edisylate] Anaphylaxis and Nausea And Vomiting    Other reaction(s): dystonia from this vs. reglan 23 Jul - patient relates that she takes promethazine frequently with no problems. 23 Jul - patient relates that she takes promethazine frequently with no problems.  . Sumatriptan Succinate     Other reaction(s): delirium and hallucinations per [facility] records  . Dilaudid [Hydromorphone Hcl] Other (See Comments)    Delirium   . Ondansetron Other (See Comments)    hallucinations    . Zofran [Ondansetron Hcl] Other (See Comments)    Reaction:  hallucinations  Reaction:   hallucinations   . Codeine Nausea And Vomiting  . Gabapentin Other (See Comments)    Reaction:  Unknown    . [place] Nausea And Vomiting  . Losartan Nausea Only  . Oxycodone Anxiety  . Prochlorperazine Other (See Comments)    Reaction:  Unknown . Patient does not remember reaction but she does have vertigo and anxiety along with n and v at times. Could be used to treat any of these   . Reglan [Metoclopramide]     Per patient her Dr. Evelina Bucy her off it  Per patient her Dr. Evelina Bucy her off it   . Scopolamine Other (See Comments)    Dizziness, also has vertigo already  . Tape Rash  . Tapentadol Rash    VITALS:  Blood pressure (!) 164/57, pulse 73, temperature 97.9 F (36.6 C), temperature source Oral, resp. rate 20, height 5' 5.5" (1.664 m), weight 86.4 kg (190 lb 7.6 oz), SpO2 98 %.  PHYSICAL EXAMINATION:  GENERAL:  57 y.o.-year-old patient lying in the bed with no acute distress.  Severe anxiety noted EYES: Pupils equal, round, reactive to light and accommodation. No scleral icterus. Extraocular muscles intact.  HEENT: Head atraumatic, normocephalic. Oropharynx and nasopharynx clear.  NECK:  Supple, no jugular venous distention. No thyroid enlargement, no tenderness.  LUNGS: Normal breath sounds bilaterally, no wheezing, rales,rhonchi or crepitation. No use of accessory muscles of respiration.  CARDIOVASCULAR: S1, S2 normal. No murmurs, rubs, or gallops.  ABDOMEN: Soft, nontender, nondistended. Bowel sounds present. No organomegaly or mass.  EXTREMITIES: No pedal edema, cyanosis, or clubbing.  NEUROLOGIC: Cranial nerves II through XII are intact. Muscle strength 5/5 in all extremities. Sensation intact. Gait not checked.  PSYCHIATRIC: The patient is alert and oriented x 3.  SKIN: No obvious rash, lesion, or ulcer.   Physical Exam LABORATORY PANEL:   CBC Recent Labs  Lab 10/04/17 0424  WBC 4.5  HGB 9.4*  HCT 29.3*  PLT 166    ------------------------------------------------------------------------------------------------------------------  Chemistries  Recent Labs  Lab 10/01/17 0826  NA 135  K 4.3  CL 96*  CO2 28  GLUCOSE 101*  BUN 10  CREATININE 4.32*  CALCIUM 7.7*   ------------------------------------------------------------------------------------------------------------------  Cardiac Enzymes No results for input(s): TROPONINI in the last 168 hours. ------------------------------------------------------------------------------------------------------------------  RADIOLOGY:  Dg Abd 2 Views  Result Date: 10/03/2017 CLINICAL DATA:  Nausea and vomiting. Status post foot surgery yesterday. EXAM: ABDOMEN - 2 VIEW COMPARISON:  Abdomen and pelvis CT dated 09/03/2017. FINDINGS: Normal bowel gas pattern without free peritoneal air. Cholecystectomy clips. Arterial calcifications. Unremarkable bones. IMPRESSION: No acute abnormality. Electronically Signed   By: Claudie Revering M.D.   On: 10/03/2017 16:35    ASSESSMENT AND PLAN:  1acute left great toe osteomyelitis Resolving Treated initially with empiric IV vancomycin, in discussion with infectious disease/Dr. Fitzgerald-we will start doxycycline 100 mg p.o. twice daily for 28-day course/we will see status post discharge for reevaluation in clinic, wound cultures noted for Staph aureus/lugdunensis-sensitivities noted, s/pleft great toe amputation 10/02/2017 by podiatry   2chronic diabetes mellitus type 2  Stable on current regiment  3chronic benign essential hypertension  Stable Normotensive without blood pressure medication  4chronic ESRD Stable On hemodialysisMWF Nephrology input greatly appreciated-for dialysis on tomorrow   5history of CAD Stable Continue to holdplavix givenrecent surgery  6acute right knee pain  Stable  X-rays negative for acute fracture orthopedic surgery input greatly appreciated-follow-up as an  outpatient for possibleSynvisc injection  7acute nausea/emesis No improvement per patient-no clinical evidence for emesis Continue antiemetics PRN Acute abdominal series was unimpressive  Disposition to home with home health services on tomorrow versus SNF      All the records are reviewed and case discussed with Care Management/Social Workerr. Management plans discussed with the patient, family and they are in agreement.  CODE STATUS: full   TOTAL TIME TAKING CARE OF THIS PATIENT: 40 minutes.     POSSIBLE D/C IN 1-2 DAYS, DEPENDING ON CLINICAL CONDITION.   Avel Peace Efrem Pitstick M.D on 10/05/2017   Between 7am to 6pm - Pager - [phone]  After 6pm go to [email or web address] - password [email or web address] [facility]  Office  [phone]  CC: Primary care physician; Freddy Finner, NP  Note: This dictation was prepared with Dragon dictation along with smaller phrase technology. Any transcriptional errors that result from this process are unintentional.

## 2017-10-05 NOTE — Progress Notes (Signed)
HD started. 

## 2017-10-05 NOTE — Progress Notes (Signed)
[facility], [place] 10/05/17  Subjective:   Patient seen during dialysis Tolerating well   HEMODIALYSIS FLOWSHEET:  Blood Flow Rate (mL/min): 300 mL/min Arterial Pressure (mmHg): -150 mmHg Venous Pressure (mmHg): 130 mmHg Transmembrane Pressure (mmHg): 60 mmHg Ultrafiltration Rate (mL/min): 1000 mL/min Dialysate Flow Rate (mL/min): 600 ml/min Conductivity: Machine : 13.7 Conductivity: Machine : 13.7 Dialysis Fluid Bolus: Normal Saline Bolus Amount (mL): 250 mL  Report nausea since morning   Objective:  Vital signs in last 24 hours:  Temp:  [97.8 F (36.6 C)-98.3 F (36.8 C)] 97.9 F (36.6 C) (01/14 0948) Pulse Rate:  [65-80] 73 (01/14 1330) Resp:  [16-24] 20 (01/14 1330) BP: (125-175)/(54-157) 164/57 (01/14 1330) SpO2:  [95 %-100 %] 98 % (01/14 1330) Weight:  [86.4 kg (190 lb 7.6 oz)] 86.4 kg (190 lb 7.6 oz) (01/14 0945)  Weight change:  Filed Weights   09/30/17 1453 10/02/17 1410 10/05/17 0945  Weight: 83.6 kg (184 lb 6.4 oz) 98.1 kg (216 lb 4.3 oz) 86.4 kg (190 lb 7.6 oz)    Intake/Output:    Intake/Output Summary (Last 24 hours) at 10/05/2017 1400 Last data filed at 10/05/2017 1330 Gross per 24 hour  Intake 240 ml  Output 3000 ml  Net -2760 ml     Physical Exam: General: Laying in bed, mild distress  HEENT Moist oral mucus membranes  Neck supple  Pulm/lungs Normal effort, clear  CVS/Heart regular  Abdomen:  Soft,   Extremities: Trace edema, left foot bandaged  Neurologic: Alert, oriented, decreased hearing  Skin: No rashes  Access: AVF       Basic Metabolic Panel:  Recent Labs  Lab 09/30/17 1555 10/01/17 0826  NA 133* 135  K 3.9 4.3  CL 95* 96*  CO2 28 28  GLUCOSE 191* 101*  BUN 8 10  CREATININE 2.91* 4.32*  CALCIUM 7.8* 7.7*     CBC: Recent Labs  Lab 09/30/17 1555 10/01/17 0826 10/04/17 0424  WBC 5.8 5.1 4.5  NEUTROABS 2.8  --   --   HGB 10.4* 9.8* 9.4*  HCT 32.0* 30.8* 29.3*  MCV 92.4 92.5  92.1  PLT 154 165 166      Lab Results  Component Value Date   HEPBSAG Negative 05/04/2017   HEPBIGM  06/12/2010    NEGATIVE (NOTE) High levels of Hepatitis B Core IgM antibody are detectable during the acute stage of Hepatitis B. This antibody is used to differentiate current from past HBV infection.       Microbiology:  Recent Results (from the past 240 hour(s))  Wound or Superficial Culture     Status: None   Collection Time: 09/30/17  3:10 PM  Result Value Ref Range Status   Specimen Description   Final    TOE LEFT Performed at [facility], [address], [place], [place] [address]    Special Requests   Final    NONE Performed at [facility], [address] [place], [place] [address]    Gram Stain   Final    RARE WBC PRESENT, PREDOMINANTLY MONONUCLEAR RARE GRAM POSITIVE COCCI Performed at [facility], [place] [address], [place], [place] [address]    Culture FEW STAPHYLOCOCCUS AUREUS  Final   Report Status 10/03/2017 FINAL  Final   Organism ID, Bacteria STAPHYLOCOCCUS AUREUS  Final      Susceptibility   Staphylococcus aureus - MIC*    CIPROFLOXACIN <=0.5 SENSITIVE Sensitive     ERYTHROMYCIN >=8 RESISTANT Resistant  GENTAMICIN <=0.5 SENSITIVE Sensitive     OXACILLIN 0.5 SENSITIVE Sensitive     TETRACYCLINE <=1 SENSITIVE Sensitive     VANCOMYCIN 1 SENSITIVE Sensitive     TRIMETH/SULFA <=10 SENSITIVE Sensitive     CLINDAMYCIN <=0.25 SENSITIVE Sensitive     RIFAMPIN <=0.5 SENSITIVE Sensitive     Inducible Clindamycin NEGATIVE Sensitive     * FEW STAPHYLOCOCCUS AUREUS  MRSA PCR Screening     Status: None   Collection Time: 09/30/17  7:55 PM  Result Value Ref Range Status   MRSA by PCR NEGATIVE NEGATIVE Final    Comment:        The GeneXpert MRSA Assay (FDA approved for NASAL specimens only), is one component of a comprehensive MRSA colonization surveillance program. It is not intended to diagnose MRSA infection nor to guide  or monitor treatment for MRSA infections. Performed at [facility], [address] [place], [place] [address]   Aerobic/Anaerobic Culture (surgical/deep wound)     Status: None (Preliminary result)   Collection Time: 10/02/17  8:02 AM  Result Value Ref Range Status   Specimen Description   Final    BONE Performed at [facility], [address], [place], [place] [address]    Special Requests   Final    GREAT TOE Performed at [facility], [address] [place], [place] [address]    Gram Stain   Final    RARE WBC PRESENT, PREDOMINANTLY MONONUCLEAR NO ORGANISMS SEEN Performed at [facility], [place] [address], [place], [place] [address]    Culture   Final    FEW STAPHYLOCOCCUS AUREUS SUSCEPTIBILITIES PERFORMED ON PREVIOUS CULTURE WITHIN THE LAST 5 DAYS. FEW STAPHYLOCOCCUS LUGDUNENSIS CRITICAL RESULT CALLED TO, READ BACK BY AND VERIFIED WITH: C GROGAN,RN AT 1230 10/03/17 BY L BENFIELD CONCERNING GROWTH ON CULTURE NO ANAEROBES ISOLATED; CULTURE IN PROGRESS FOR 5 DAYS    Report Status PENDING  Incomplete   Organism ID, Bacteria STAPHYLOCOCCUS LUGDUNENSIS  Final      Susceptibility   Staphylococcus lugdunensis - MIC*    CIPROFLOXACIN <=0.5 SENSITIVE Sensitive     ERYTHROMYCIN >=8 RESISTANT Resistant     GENTAMICIN <=0.5 SENSITIVE Sensitive     OXACILLIN 0.5 SENSITIVE Sensitive     TETRACYCLINE <=1 SENSITIVE Sensitive     VANCOMYCIN <=0.5 SENSITIVE Sensitive     TRIMETH/SULFA <=10 SENSITIVE Sensitive     CLINDAMYCIN >=8 RESISTANT Resistant     RIFAMPIN <=0.5 SENSITIVE Sensitive     Inducible Clindamycin NEGATIVE Sensitive     * FEW STAPHYLOCOCCUS LUGDUNENSIS    Coagulation Studies: No results for input(s): LABPROT, INR in the last 72 hours.  Urinalysis: No results for input(s): COLORURINE, LABSPEC, PHURINE, GLUCOSEU, HGBUR, BILIRUBINUR, KETONESUR, PROTEINUR, UROBILINOGEN, NITRITE, LEUKOCYTESUR in the last 72 hours.  Invalid input(s):  APPERANCEUR    Imaging: Dg Abd 2 Views  Result Date: 10/03/2017 CLINICAL DATA:  Nausea and vomiting. Status post foot surgery yesterday. EXAM: ABDOMEN - 2 VIEW COMPARISON:  Abdomen and pelvis CT dated 09/03/2017. FINDINGS: Normal bowel gas pattern without free peritoneal air. Cholecystectomy clips. Arterial calcifications. Unremarkable bones. IMPRESSION: No acute abnormality. Electronically Signed   By: Claudie Revering M.D.   On: 10/03/2017 16:35     Medications:   . vancomycin Stopped (10/02/17 1700)   . aspirin EC  81 mg Oral Daily  . atorvastatin  20 mg Oral QHS  . cholecalciferol  400 Units Oral Daily  . doxycycline  100 mg Oral Q12H  .  epoetin (EPOGEN/PROCRIT) injection  10,000 Units Intravenous Q M,W,F-HD  . famotidine  20 mg Oral BID  . FLUoxetine  20 mg Oral QHS  . heparin  5,000 Units Subcutaneous Q8H  . insulin aspart  0-9 Units Subcutaneous TID WC  . lipase/protease/amylase  36,000 Units Oral TID WC  . multivitamin  1 tablet Oral Daily  . omega-3 acid ethyl esters  1 g Oral Daily  . pantoprazole  40 mg Oral Daily  . ranolazine  1,000 mg Oral BID  . rifaximin  550 mg Oral BID  . sevelamer carbonate  800 mg Oral TID WC  . sucralfate  1 g Oral TID WC & HS  . cyanocobalamin  1,000 mcg Oral Q lunch   acetaminophen, albuterol, alum & mag hydroxide-simeth, diphenhydrAMINE, docusate sodium, HYDROcodone-acetaminophen, hydrOXYzine, meclizine, nitroGLYCERIN, ondansetron (ZOFRAN) IV, promethazine  Assessment/ Plan:  57 y.o. female with diabetes, hypertension, hyperlipidemia, diabetic retinopathy, diabetic neuropathy, diabetic gastroparesis, pancreatic insufficiency, depression, GERD. Renal masses, M-spike   MWF [facility], Right AVG  1. End Stage Renal Disease: hemodialysis MWF. Continue HD treatments as scheduled  2.  Gastroparesis causing nausea and vomiting Symptomatic control   3. Anemia of chronic kidney disease: hemoglobin 9.4 - [facility] as outpatient.    4. Secondary Hyperparathyroidism with hyperphosphatemia and hypocalcemia -  HOLD sevelamer until nausea and vomiting are resolved     LOS: [place] 1/14/20192:00 PM  [place], [place]

## 2017-10-05 NOTE — Consult Note (Signed)
[facility] Infectious Disease     Reason for Consult: DM foot infection with osteomyelitis    Referring Physician: Dolores Frame Date of Admission:  09/30/2017   Principal Problem:   Osteomyelitis ([place])   HPI: [name] is a 57 y.o. female with a known history of COPD, ESRD, DM, CAD, Diastolic CHF admitted with foot ulcer and underlying osteomyelitis of L foot. On 1/11 underwent amputation of distal L hallux and portion of prox phalanx. Cx with MSSA and Staph lugdenesis.  Past Medical History:  Diagnosis Date  . Anemia   . Anginal pain ([place])   . Anxiety   . Arthritis   . Asthma   . Broken wrist   . Bronchitis   . chronic diastolic CHF 5/36/4680  . COPD (chronic obstructive pulmonary disease) ([place])   . Coronary artery disease    a. cath 2013: stenting to RCA (report not available); b. cath 2014: LM nl, pLAD 40%, mLAD nl, ost LCx 40%, mid LCx nl, pRCA 30% @ site of prior stent, mRCA 50%  . Depression   . Diabetes mellitus without complication ([place])   . Diabetic neuropathy ([place])   . dialysis 2006  . Diverticulosis   . Dizziness   . Dyspnea   . Elevated lipids   . Environmental and seasonal allergies   . ESRD (end stage renal disease) on dialysis ([place])    M-W-F  . GERD (gastroesophageal reflux disease)   . Headache   . History of hiatal hernia   . HOH (hard of hearing)   . Hx of pancreatitis 2015  . Hypertension   . Lower extremity edema   . Mitral regurgitation    a. echo 10/2013: EF 62%, noWMA, mildly dilated LA, mild to mod MR/TR, GR1DD  . Myocardial infarction ([place])   . Orthopnea   . Pneumonia   . Renal cancer ([place])   . Renal insufficiency    Pt is on dialysis on M,W + F.  . Wheezing    Past Surgical History:  Procedure Laterality Date  . ABDOMINAL HYSTERECTOMY  1992  . AMPUTATION TOE Left 10/02/2017   Procedure: AMPUTATION TOE-LEFT GREAT TOE;  Surgeon: Albertine Patricia, DPM;  Location: [facility] ORS;  Service: Podiatry;  Laterality: Left;  . APPENDECTOMY    .  CARDIAC CATHETERIZATION Left 07/26/2015   Procedure: Left Heart Cath and Coronary Angiography;  Surgeon: Dionisio , MD;  Location: [place] [facility];  Service: Cardiovascular;  Laterality: Left;  . CATARACT EXTRACTION W/ INTRAOCULAR LENS IMPLANT Right   . CATARACT EXTRACTION W/PHACO Left 03/10/2017   Procedure: CATARACT EXTRACTION PHACO AND INTRAOCULAR LENS PLACEMENT (IOC);  Surgeon: Birder Robson, MD;  Location: [facility] ORS;  Service: Ophthalmology;  Laterality: Left;  [place] 00:51.9 AP% 14.2 CDE 7.39 fluid pack lot # [id number]  . CHOLECYSTECTOMY    . COLONOSCOPY WITH PROPOFOL N/A 08/12/2016   Procedure: COLONOSCOPY WITH PROPOFOL;  Surgeon: Lollie Sails, MD;  Location: [facility] ENDOSCOPY;  Service: Endoscopy;  Laterality: N/A;  . DIALYSIS FISTULA CREATION Left    upper arm  . dialysis grafts    . ESOPHAGOGASTRODUODENOSCOPY N/A 03/08/2015   Procedure: ESOPHAGOGASTRODUODENOSCOPY (EGD);  Surgeon: Manya Silvas, MD;  Location: [facility] ENDOSCOPY;  Service: Endoscopy;  Laterality: N/A;  . ESOPHAGOGASTRODUODENOSCOPY (EGD) WITH PROPOFOL N/A 03/18/2016   Procedure: ESOPHAGOGASTRODUODENOSCOPY (EGD) WITH PROPOFOL;  Surgeon: [name], MD;  Location: [facility] ENDOSCOPY;  Service: Endoscopy;  Laterality: N/A;  . EYE SURGERY Right 2018  . FECAL TRANSPLANT N/A 08/23/2015  Procedure: FECAL TRANSPLANT;  Surgeon: Manya Silvas, MD;  Location: [facility] ENDOSCOPY;  Service: Endoscopy;  Laterality: N/A;  . HAND SURGERY Bilateral   . PERIPHERAL VASCULAR CATHETERIZATION N/A 12/20/2015   Procedure: Thrombectomy of dialysis access versus permcath placement;  Surgeon: Algernon Huxley, MD;  Location: [place] [facility];  Service: Cardiovascular;  Laterality: N/A;  . PERIPHERAL VASCULAR CATHETERIZATION N/A 12/20/2015   Procedure: A/V Shunt Intervention;  Surgeon: Algernon Huxley, MD;  Location: [place] [facility];  Service: Cardiovascular;  Laterality: N/A;  . PERIPHERAL VASCULAR CATHETERIZATION N/A 12/20/2015   Procedure:  A/V Shuntogram/Fistulagram;  Surgeon: Algernon Huxley, MD;  Location: [place] [facility];  Service: Cardiovascular;  Laterality: N/A;  . PERIPHERAL VASCULAR CATHETERIZATION N/A 01/02/2016   Procedure: A/V Shuntogram/Fistulagram;  Surgeon: Algernon Huxley, MD;  Location: [place] [facility];  Service: Cardiovascular;  Laterality: N/A;  . PERIPHERAL VASCULAR CATHETERIZATION N/A 01/02/2016   Procedure: A/V Shunt Intervention;  Surgeon: Algernon Huxley, MD;  Location: [place] [facility];  Service: Cardiovascular;  Laterality: N/A;   Social History   Tobacco Use  . Smoking status: Former Smoker    Packs/day: 0.50    Years: 40.00    Pack years: 20.00    Types: Cigarettes    Last attempt to quit: 02/13/2015    Years since quitting: 2.6  . Smokeless tobacco: Never Used  Substance Use Topics  . Alcohol use: Yes    Alcohol/week: 0.6 oz    Types: 1 Glasses of wine per week    Comment: glass wine week per pt  . Drug use: Yes    Types: Marijuana    Comment: once a day   Family History  Problem Relation Age of Onset  . Kidney disease Mother   . Diabetes Mother   . Cancer Father   . Kidney disease Sister     Allergies:  Allergies  Allergen Reactions  . Ace Inhibitors Swelling and Anaphylaxis  . Ativan [Lorazepam] Other (See Comments)    Reaction:Hallucinations and headaches Reaction:Hallucinations and headaches Reaction:  Hallucinations and headaches  . Compazine [Prochlorperazine Edisylate] Anaphylaxis and Nausea And Vomiting    Other reaction(s): dystonia from this vs. reglan 23 Jul - patient relates that she takes promethazine frequently with no problems. 23 Jul - patient relates that she takes promethazine frequently with no problems.  . Sumatriptan Succinate     Other reaction(s): delirium and hallucinations per [facility] records  . Dilaudid [Hydromorphone Hcl] Other (See Comments)    Delirium   . Ondansetron Other (See Comments)    hallucinations    . Zofran [Ondansetron Hcl] Other  (See Comments)    Reaction:  hallucinations  Reaction:  hallucinations   . Codeine Nausea And Vomiting  . Gabapentin Other (See Comments)    Reaction:  Unknown    . [place] Nausea And Vomiting  . Losartan Nausea Only  . Oxycodone Anxiety  . Prochlorperazine Other (See Comments)    Reaction:  Unknown . Patient does not remember reaction but she does have vertigo and anxiety along with n and v at times. Could be used to treat any of these   . Reglan [Metoclopramide]     Per patient her Dr. Evelina Bucy her off it  Per patient her Dr. Evelina Bucy her off it   . Scopolamine Other (See Comments)    Dizziness, also has vertigo already  . Tape Rash  . Tapentadol Rash    Current antibiotics: Antibiotics Given (last 72  hours)    Date/Time Action Medication Dose Rate   10/02/17 1542 New Bag/Given   vancomycin (VANCOCIN) IVPB 1000 mg/200 mL premix 1,000 mg 200 mL/hr   10/02/17 1929 New Bag/Given   piperacillin-tazobactam (ZOSYN) IVPB 3.375 g 3.375 g 12.5 mL/hr   10/02/17 2103 Given   rifaximin (XIFAXAN) tablet 550 mg 550 mg    10/03/17 0158 New Bag/Given   piperacillin-tazobactam (ZOSYN) IVPB 3.375 g 3.375 g 12.5 mL/hr   10/03/17 1047 Given   rifaximin (XIFAXAN) tablet 550 mg 550 mg    10/03/17 2146 Given   rifaximin (XIFAXAN) tablet 550 mg 550 mg    10/04/17 1038 Given   rifaximin (XIFAXAN) tablet 550 mg 550 mg    10/04/17 2057 Given   rifaximin (XIFAXAN) tablet 550 mg 550 mg       MEDICATIONS: . aspirin EC  81 mg Oral Daily  . atorvastatin  20 mg Oral QHS  . cholecalciferol  400 Units Oral Daily  . epoetin (EPOGEN/PROCRIT) injection  10,000 Units Intravenous Q M,W,F-HD  . famotidine  20 mg Oral BID  . FLUoxetine  20 mg Oral QHS  . heparin  5,000 Units Subcutaneous Q8H  . insulin aspart  0-9 Units Subcutaneous TID WC  . lipase/protease/amylase  36,000 Units Oral TID WC  . multivitamin  1 tablet Oral Daily  . omega-3 acid ethyl esters  1 g Oral Daily  . pantoprazole  40 mg Oral Daily   . ranolazine  1,000 mg Oral BID  . rifaximin  550 mg Oral BID  . sevelamer carbonate  800 mg Oral TID WC  . sucralfate  1 g Oral TID WC & HS  . cyanocobalamin  1,000 mcg Oral Q lunch    Review of Systems - 11 systems reviewed and negative per HPI   OBJECTIVE: Temp:  [97.8 F (36.6 C)-98.3 F (36.8 C)] 97.9 F (36.6 C) (01/14 0948) Pulse Rate:  [65-80] 70 (01/14 1230) Resp:  [16-24] 16 (01/14 1230) BP: (132-175)/(54-73) 159/54 (01/14 1230) SpO2:  [95 %-99 %] 95 % (01/14 1230) Weight:  [86.4 kg (190 lb 7.6 oz)] 86.4 kg (190 lb 7.6 oz) (01/14 0945) Physical Exam  Constitutional:  oriented to person, place, and time. appears well-developed and well-nourished. No distress.  HENT: /AT, PERRLA, no scleral icterus Mouth/Throat: Oropharynx is clear and moist. No oropharyngeal exudate.  Cardiovascular: Normal rate, regular rhythm and normal heart sounds.Pulmonary/Chest: Effort normal and breath sounds normal. No respiratory distress.  has no wheezes.  Neck = supple, no nuchal rigidity Abdominal: Soft. Bowel sounds are normal.  exhibits no distension. TLymphadenopathy: no cervical adenopathy. No axillary adenopathy Neurological: alert and oriented to person, place, and time.  Ext L leg wrapped post op Skin: Skin is warm and dry. No rash noted. No erythema.  Psychiatric: a normal mood and affect.  behavior is normal.    LABS: Results for orders placed or performed during the hospital encounter of 09/30/17 (from the past 48 hour(s))  Glucose, capillary     Status: Abnormal   Collection Time: 10/03/17  4:33 PM  Result Value Ref Range   Glucose-Capillary 177 (H) 65 - 99 mg/dL   Comment 1 Notify RN   Glucose, capillary     Status: Abnormal   Collection Time: 10/03/17  8:19 PM  Result Value Ref Range   Glucose-Capillary 163 (H) 65 - 99 mg/dL  CBC     Status: Abnormal   Collection Time: 10/04/17  4:24 AM  Result Value Ref Range  WBC 4.5 3.6 - 11.0 K/uL   RBC 3.18 (L) 3.80 - 5.20  MIL/uL   Hemoglobin 9.4 (L) 12.0 - 16.0 g/dL   HCT 29.3 (L) 35.0 - 47.0 %   MCV 92.1 80.0 - 100.0 fL   MCH 29.7 26.0 - 34.0 pg   MCHC 32.3 32.0 - 36.0 g/dL   RDW 14.9 (H) 11.5 - 14.5 %   Platelets 166 150 - 440 K/uL    Comment: Performed at [facility], [address] [place], [place] [address]  Glucose, capillary     Status: Abnormal   Collection Time: 10/04/17  7:34 AM  Result Value Ref Range   Glucose-Capillary 130 (H) 65 - 99 mg/dL   Comment 1 Notify RN   Glucose, capillary     Status: Abnormal   Collection Time: 10/04/17 11:40 AM  Result Value Ref Range   Glucose-Capillary 166 (H) 65 - 99 mg/dL   Comment 1 Notify RN   Glucose, capillary     Status: Abnormal   Collection Time: 10/04/17  4:42 PM  Result Value Ref Range   Glucose-Capillary 137 (H) 65 - 99 mg/dL   Comment 1 Notify RN   Glucose, capillary     Status: Abnormal   Collection Time: 10/04/17  9:12 PM  Result Value Ref Range   Glucose-Capillary 111 (H) 65 - 99 mg/dL  Glucose, capillary     Status: Abnormal   Collection Time: 10/05/17  8:58 AM  Result Value Ref Range   Glucose-Capillary 116 (H) 65 - 99 mg/dL   Comment 1 Notify RN    No components found for: ESR, C REACTIVE PROTEIN MICRO: Recent Results (from the past 720 hour(s))  Wound or Superficial Culture     Status: None   Collection Time: 09/30/17  3:10 PM  Result Value Ref Range Status   Specimen Description   Final    TOE LEFT Performed at [facility], [address], [place], [place] [address]    Special Requests   Final    NONE Performed at [facility], [address], [place], [place] [address]    Gram Stain   Final    RARE WBC PRESENT, PREDOMINANTLY MONONUCLEAR RARE GRAM POSITIVE COCCI Performed at [facility], [place] [address], [place],  [address]    Culture FEW STAPHYLOCOCCUS AUREUS  Final   Report Status 10/03/2017 FINAL  Final   Organism ID, Bacteria STAPHYLOCOCCUS AUREUS  Final       Susceptibility   Staphylococcus aureus - MIC*    CIPROFLOXACIN <=0.5 SENSITIVE Sensitive     ERYTHROMYCIN >=8 RESISTANT Resistant     GENTAMICIN <=0.5 SENSITIVE Sensitive     OXACILLIN 0.5 SENSITIVE Sensitive     TETRACYCLINE <=1 SENSITIVE Sensitive     VANCOMYCIN 1 SENSITIVE Sensitive     TRIMETH/SULFA <=10 SENSITIVE Sensitive     CLINDAMYCIN <=0.25 SENSITIVE Sensitive     RIFAMPIN <=0.5 SENSITIVE Sensitive     Inducible Clindamycin NEGATIVE Sensitive     * FEW STAPHYLOCOCCUS AUREUS  MRSA PCR Screening     Status: None   Collection Time: 09/30/17  7:55 PM  Result Value Ref Range Status   MRSA by PCR NEGATIVE NEGATIVE Final    Comment:        The GeneXpert MRSA Assay (FDA approved for NASAL specimens only), is one component of a comprehensive MRSA colonization surveillance program. It is not intended to diagnose MRSA infection nor to guide or monitor treatment  for MRSA infections. Performed at [facility], [address] [place], [place] [address]   Aerobic/Anaerobic Culture (surgical/deep wound)     Status: None (Preliminary result)   Collection Time: 10/02/17  8:02 AM  Result Value Ref Range Status   Specimen Description   Final    BONE Performed at [facility], [address], [place], [place] [address]    Special Requests   Final    GREAT TOE Performed at [facility], [address] [place], [place] [address]    Gram Stain   Final    RARE WBC PRESENT, PREDOMINANTLY MONONUCLEAR NO ORGANISMS SEEN Performed at [facility], [place] [address], [place], [place] [address]    Culture   Final    FEW STAPHYLOCOCCUS AUREUS SUSCEPTIBILITIES PERFORMED ON PREVIOUS CULTURE WITHIN THE LAST 5 DAYS. FEW STAPHYLOCOCCUS LUGDUNENSIS CRITICAL RESULT CALLED TO, READ BACK BY AND VERIFIED WITH: C GROGAN,RN AT 1230 10/03/17 BY L BENFIELD CONCERNING GROWTH ON CULTURE NO ANAEROBES ISOLATED; CULTURE IN PROGRESS FOR 5 DAYS    Report Status PENDING   Incomplete   Organism ID, Bacteria STAPHYLOCOCCUS LUGDUNENSIS  Final      Susceptibility   Staphylococcus lugdunensis - MIC*    CIPROFLOXACIN <=0.5 SENSITIVE Sensitive     ERYTHROMYCIN >=8 RESISTANT Resistant     GENTAMICIN <=0.5 SENSITIVE Sensitive     OXACILLIN 0.5 SENSITIVE Sensitive     TETRACYCLINE <=1 SENSITIVE Sensitive     VANCOMYCIN <=0.5 SENSITIVE Sensitive     TRIMETH/SULFA <=10 SENSITIVE Sensitive     CLINDAMYCIN >=8 RESISTANT Resistant     RIFAMPIN <=0.5 SENSITIVE Sensitive     Inducible Clindamycin NEGATIVE Sensitive     * FEW STAPHYLOCOCCUS LUGDUNENSIS    IMAGING: Dg Knee Complete 4 Views Right  Result Date: 09/30/2017 CLINICAL DATA:  Fall, injury to right knee EXAM: RIGHT KNEE - COMPLETE 4+ VIEW COMPARISON:  01/06/2012 FINDINGS: No acute bony abnormality. Specifically, no fracture, subluxation, or dislocation. No joint effusion. Joint spaces maintained. Vascular calcifications noted. IMPRESSION: No acute bony abnormality. Electronically Signed   By: Rolm Baptise M.D.   On: 09/30/2017 15:27   Dg Abd 2 Views  Result Date: 10/03/2017 CLINICAL DATA:  Nausea and vomiting. Status post foot surgery yesterday. EXAM: ABDOMEN - 2 VIEW COMPARISON:  Abdomen and pelvis CT dated 09/03/2017. FINDINGS: Normal bowel gas pattern without free peritoneal air. Cholecystectomy clips. Arterial calcifications. Unremarkable bones. IMPRESSION: No acute abnormality. Electronically Signed   By: Claudie Revering M.D.   On: 10/03/2017 16:35   Dg Toe Great Left  Result Date: 09/30/2017 CLINICAL DATA:  Diabetic ulcer of left great toe. EXAM: LEFT GREAT TOE-3 VIEW COMPARISON:  None. FINDINGS: Soft tissue swelling and ulcer is seen overlying the distal phalanx. Osteolysis is seen involving the tuft of the distal phalanx, consistent with osteomyelitis. No other bone lesions identified. IMPRESSION: Osteomyelitis involving tuft of distal phalanx of great toe. Electronically Signed   By: Earle Gell M.D.   On:  09/30/2017 08:18    Assessment:   [name] is a 57 y.o. female with a known history of COPD, ESRD, DM, CAD, Diastolic CHF admitted with foot ulcer and underlying osteomyelitis of L foot. On 1/11 underwent amputation of distal L hallux and portion of prox phalanx. Cx with MSSA and Staph lugdenesis.  No fevers, wbc nml. Post op site stable per Dr Elvina Mattes. Initially on vanco and zosyn then changed to vanco and now on doxy.   Recommendations Infected  bone has been resected.  At this point no need for IV abx unless this worsens.  Would rec Doxy 100 bid for 28 days given good bioavailability and bone penetration.  [facility] and I can see in 3-4 weeks.  Thank you very much for allowing me to participate in the care of this patient. Please call with questions.   Cheral Marker. Ola Spurr, MD

## 2017-10-06 LAB — GLUCOSE, CAPILLARY
Glucose-Capillary: 65 mg/dL (ref 65–99)
Glucose-Capillary: 76 mg/dL (ref 65–99)
Glucose-Capillary: 97 mg/dL (ref 65–99)
Glucose-Capillary: 115 mg/dL — ABNORMAL HIGH (ref 65–99)
Glucose-Capillary: 132 mg/dL — ABNORMAL HIGH (ref 65–99)

## 2017-10-06 MED ORDER — DOXYCYCLINE HYCLATE 100 MG PO TABS: 100.0000 mg | ORAL_TABLET | Freq: Two times a day (BID) | ORAL | 0 refills | Status: DC

## 2017-10-06 MED ORDER — AMLODIPINE BESYLATE 10 MG PO TABS
10.0000 mg | ORAL_TABLET | Freq: Every day | ORAL | Status: DC
  Administered 2017-10-06: 10 mg via ORAL
  Filled 2017-10-06: qty 1

## 2017-10-06 MED ORDER — PROMETHAZINE HCL 12.5 MG PO TABS: 12.5000 mg | ORAL_TABLET | Freq: Four times a day (QID) | ORAL | 0 refills | Status: DC | PRN

## 2017-10-06 NOTE — Progress Notes (Signed)
[name] MRN: [id number] DOB: 13-Jun-1961   Cancelled Treatment:     Attempted to see pt this AM, she was lethargic, dry heaving and essentially feeling very poorly.  Pt requesting for PT to try back later, hoping she is feeling better at that point. Will try back this afternoon as time allows and pt is able to participate.   Kreg Shropshire, DPT 09/23/2017, 11:23 AM

## 2017-10-06 NOTE — Care Management (Signed)
Notified Estill Bamberg with [facility] of admission.

## 2017-10-06 NOTE — Progress Notes (Signed)
Discharge instructions and med details reviewed with patient. Patient verbalizes understanding. Printed AVS given to patient.  EMS called for transportation.

## 2017-10-06 NOTE — NC FL2 (Signed)
[place] LEVEL OF CARE SCREENING TOOL     IDENTIFICATION  Patient Name: [name] Birthdate: [birth year] Sex: female Admission Date (Current Location): 09/30/2017  [facility] and [place] Number:  [name] ([id number]) Facility and Address:  [facility], [address] [place], [place] [address]      Provider Number: [id number]  Attending Physician Name and Address:  [name], MD  Relative Name and Phone Number:       Current Level of Care: Hospital Recommended Level of Care: [place] Prior Approval Number:    Date Approved/Denied:   PASRR Number: [id number])  Discharge Plan: SNF    Current Diagnoses: Patient Active Problem List   Diagnosis Date Noted  . Osteomyelitis ([place]) 09/30/2017  . Carotid stenosis 06/18/2017  . Shortness of breath 05/04/2017  . Cellulitis of lower extremity 07/29/2016  . Chronic venous insufficiency 07/29/2016  . Lymphedema 07/29/2016  . TIA (transient ischemic attack) 04/21/2016  . Altered mental status 04/08/2016  . Hyperammonemia ([place]) 04/08/2016  . Elevated troponin 04/08/2016  . Depression 04/08/2016  . Depression, major, recurrent, severe with psychosis ([place]) 04/08/2016  . Blood in stool   . Intractable cyclical vomiting with nausea   . Reflux esophagitis   . Gastritis   . Generalized abdominal pain   . Uncontrollable vomiting   . Major depressive disorder, recurrent episode, moderate ([place]) 03/15/2016  . Adjustment disorder with mixed anxiety and depressed mood 03/15/2016  . Malnutrition of moderate degree 12/01/2015  . Renal mass   . Dyspnea   . Acute renal failure ([place])   . Respiratory failure ([place])   . High temperature 11/14/2015  . Pulmonary edema   . Encounter for central line placement   . Encounter for orogastric (OG) tube placement   . Nausea 11/12/2015  . Hyperkalemia 10/03/2015  . Diarrhea, unspecified 07/22/2015  . Pneumonia 05/21/2015  .  Hypoglycemia 04/24/2015  . Unresponsiveness 04/24/2015  . Bradycardia 04/24/2015  . Hypothermia 04/24/2015  . Acute respiratory failure ([place]) 04/24/2015  . Acute diastolic CHF (congestive heart failure) ([place]) 04/05/2015  . Diabetic gastroparesis ([place]) 04/05/2015  . Hypokalemia 04/05/2015  . Generalized weakness 04/05/2015  . Acute pulmonary edema ([place]) 04/03/2015  . Nausea and vomiting 04/03/2015  . Hypoglycemia associated with diabetes ([place]) 04/03/2015  . Anemia of chronic disease 04/03/2015  . Secondary hyperparathyroidism ([place]) 04/03/2015  . Pressure ulcer 04/02/2015  . Acute respiratory failure with hypoxia ([place]) 04/01/2015  . Adjustment disorder with anxiety 03/14/2015  . Somatic symptom disorder, mild 03/08/2015  . Coronary artery disease involving native coronary artery of native heart without angina pectoris   . Nausea & vomiting 03/06/2015  . Abdominal pain 03/06/2015  . DM (diabetes mellitus) ([place]) 03/06/2015  . HTN (hypertension) 03/06/2015  . Gastroparesis 02/24/2015  . Pleural effusion 02/19/2015  . HCAP (healthcare-associated pneumonia) 02/19/2015  . End-stage renal disease on hemodialysis ([place]) 02/19/2015    Orientation RESPIRATION BLADDER Height & Weight     Self, Time, Situation, Place  Normal Continent Weight: 181 lb 3.2 oz (82.2 kg) Height:  5' 5.5" (166.4 cm)  BEHAVIORAL SYMPTOMS/MOOD NEUROLOGICAL BOWEL NUTRITION STATUS      Continent Diet(Renal with Fluid Restriction)  AMBULATORY STATUS COMMUNICATION OF NEEDS Skin   Extensive Assist Verbally Surgical wounds(Incision Left Great Toe)                       Personal Care Assistance Level of Assistance  Bathing, Feeding, Dressing Bathing Assistance: Limited  assistance Feeding assistance: Independent Dressing Assistance: Limited assistance     Functional Limitations Info  Sight, Hearing, Speech Sight Info: Adequate Hearing Info: Adequate Speech Info: Adequate    SPECIAL CARE FACTORS FREQUENCY   PT (By licensed PT), OT (By licensed OT)     PT Frequency: (5) OT Frequency: (5)            Contractures      Additional Factors Info  Code Status, Allergies Code Status Info: (Full Code) Allergies Info: (ACE INHIBITORS, ATIVAN LORAZEPAM, COMPAZINE PROCHLORPERAZINE EDISYLATE, SUMATRIPTAN SUCCINATE, DILAUDID HYDROMORPHONE HCL, ONDANSETRON, ZOFRAN ONDANSETRON HCL, CODEINE, GABAPENTIN, LAC BOVIS, LOSARTAN, OXYCODONE, PROCHLORPERAZINE, REGLAN METOCLO)           Current Medications (10/22/2017):  This is the current hospital active medication list Current Facility-Administered Medications  Medication Dose Route Frequency Provider Last Rate Last Dose  . acetaminophen (TYLENOL) tablet 500 mg  500 mg Oral Q6H PRN Salary, Montell D, MD   500 mg at 10/04/17 1851  . albuterol (PROVENTIL) (2.5 MG/3ML) 0.083% nebulizer solution 2.5 mg  2.5 mg Nebulization Q6H PRN Vaughan Basta, MD      . alum & mag hydroxide-simeth (MAALOX/MYLANTA) 200-200-20 MG/5ML suspension 5 mL  5 mL Oral Q6H PRN Vaughan Basta, MD   5 mL at 10/04/2017 0948  . amLODipine (NORVASC) tablet 10 mg  10 mg Oral Daily Salary, Montell D, MD   10 mg at 10/01/2017 0903  . aspirin EC tablet 81 mg  81 mg Oral Daily Vaughan Basta, MD   81 mg at 10/04/17 1038  . atorvastatin (LIPITOR) tablet 20 mg  20 mg Oral QHS Vaughan Basta, MD   20 mg at 10/05/17 2234  . cholecalciferol (VITAMIN D) tablet 400 Units  400 Units Oral Daily Vaughan Basta, MD   400 Units at 10/05/17 1746  . diphenhydrAMINE (BENADRYL) capsule 25 mg  25 mg Oral Q6H PRN Vaughan Basta, MD      . docusate sodium (COLACE) capsule 100 mg  100 mg Oral BID PRN Vaughan Basta, MD      . doxycycline (VIBRA-TABS) tablet 100 mg  100 mg Oral Q12H Salary, Montell D, MD   100 mg at 10/05/17 1745  . famotidine (PEPCID) tablet 20 mg  20 mg Oral BID Vaughan Basta, MD   20 mg at 09/25/2017 0904  . FLUoxetine (PROZAC) capsule 20  mg  20 mg Oral QHS Vaughan Basta, MD   20 mg at 10/05/17 2234  . heparin injection 5,000 Units  5,000 Units Subcutaneous Q8H Vaughan Basta, MD   5,000 Units at 10/08/2017 0630  . HYDROcodone-acetaminophen (NORCO/VICODIN) 5-325 MG per tablet 1 tablet  1 tablet Oral TID PRN Loney Hering D, MD   1 tablet at 10/16/2017 0717  . hydrOXYzine (ATARAX/VISTARIL) tablet 25 mg  25 mg Oral Q6H PRN Salary, Montell D, MD      . insulin aspart (novoLOG) injection 0-9 Units  0-9 Units Subcutaneous TID WC Vaughan Basta, MD   1 Units at 10/04/17 1722  . lipase/protease/amylase (CREON) capsule 36,000 Units  36,000 Units Oral TID WC Kolluru, Sarath, MD   36,000 Units at 09/24/2017 0900  . meclizine (ANTIVERT) tablet 12.5 mg  12.5 mg Oral TID PRN Salary, Montell D, MD      . multivitamin (RENA-VIT) tablet 1 tablet  1 tablet Oral Daily Salary, Montell D, MD   1 tablet at 10/04/17 1041  . nitroGLYCERIN (NITROSTAT) SL tablet 0.4 mg  0.4 mg Sublingual Q5 Min x 3 PRN Salary, Montell  D, MD      . omega-3 acid ethyl esters (LOVAZA) capsule 1 g  1 g Oral Daily Salary, Montell D, MD   1 g at 10/04/17 1038  . ondansetron (ZOFRAN) injection 4 mg  4 mg Intravenous Q6H PRN Lance Coon, MD   4 mg at 10/05/2017 0443  . pantoprazole (PROTONIX) EC tablet 40 mg  40 mg Oral Daily Salary, Montell D, MD   40 mg at 10/08/2017 0904  . promethazine (PHENERGAN) injection 12.5-25 mg  12.5-25 mg Intravenous Q6H PRN Lance Coon, MD   25 mg at 10/11/2017 0938  . ranolazine (RANEXA) 12 hr tablet 1,000 mg  1,000 mg Oral BID Loney Hering D, MD   1,000 mg at 10/05/17 2234  . rifaximin (XIFAXAN) tablet 550 mg  550 mg Oral BID Loney Hering D, MD   550 mg at 10/05/17 2234  . sucralfate (CARAFATE) tablet 1 g  1 g Oral TID WC & HS Salary, Montell D, MD   1 g at 10/01/2017 0904  . vitamin B-12 (CYANOCOBALAMIN) tablet 1,000 mcg  1,000 mcg Oral Q lunch Vaughan Basta, MD   1,000 mcg at 10/04/17 1302     Discharge  Medications: Please see discharge summary for a list of discharge medications.  Relevant Imaging Results:  Relevant Lab Results:   Additional Information (SSN: [id number])  Smith Mince, Student-Social Work

## 2017-10-06 NOTE — Progress Notes (Signed)
Patient Demographics  [name], is a 57 y.o. female   MRN: [id number]   DOB - Jun 08, 1961  Admit Date - 09/30/2017    Outpatient Primary MD for the patient is Freddy Finner, NP  Consult requested in the Hospital by [name], MD, On 10/14/2017   With History of -  Past Medical History:  Diagnosis Date  . Anemia   . Anginal pain ([place])   . Anxiety   . Arthritis   . Asthma   . Broken wrist   . Bronchitis   . chronic diastolic CHF 2/67/1245  . COPD (chronic obstructive pulmonary disease) ([place])   . Coronary artery disease    a. cath 2013: stenting to RCA (report not available); b. cath 2014: LM nl, pLAD 40%, mLAD nl, ost LCx 40%, mid LCx nl, pRCA 30% @ site of prior stent, mRCA 50%  . Depression   . Diabetes mellitus without complication ([place])   . Diabetic neuropathy ([place])   . dialysis 2006  . Diverticulosis   . Dizziness   . Dyspnea   . Elevated lipids   . Environmental and seasonal allergies   . ESRD (end stage renal disease) on dialysis ([place])    M-W-F  . GERD (gastroesophageal reflux disease)   . Headache   . History of hiatal hernia   . HOH (hard of hearing)   . Hx of pancreatitis 2015  . Hypertension   . Lower extremity edema   . Mitral regurgitation    a. echo 10/2013: EF 62%, noWMA, mildly dilated LA, mild to mod MR/TR, GR1DD  . Myocardial infarction ([place])   . Orthopnea   . Pneumonia   . Renal cancer ([place])   . Renal insufficiency    Pt is on dialysis on M,W + F.  . Wheezing       Past Surgical History:  Procedure Laterality Date  . ABDOMINAL HYSTERECTOMY  1992  . AMPUTATION TOE Left 10/02/2017   Procedure: AMPUTATION TOE-LEFT GREAT TOE;  Surgeon: Albertine Patricia, DPM;  Location: [facility] ORS;  Service: Podiatry;  Laterality: Left;  . APPENDECTOMY    . CARDIAC CATHETERIZATION Left 07/26/2015   Procedure: Left Heart Cath and Coronary Angiography;  Surgeon: Dionisio David, MD;  Location: [place] [facility] LAB;  Service:  Cardiovascular;  Laterality: Left;  . CATARACT EXTRACTION W/ INTRAOCULAR LENS IMPLANT Right   . CATARACT EXTRACTION W/PHACO Left 03/10/2017   Procedure: CATARACT EXTRACTION PHACO AND INTRAOCULAR LENS PLACEMENT (IOC);  Surgeon: Birder Robson, MD;  Location: [facility] ORS;  Service: Ophthalmology;  Laterality: Left;  [place] 00:51.9 AP% 14.2 CDE 7.39 fluid pack lot # [id number]  . CHOLECYSTECTOMY    . COLONOSCOPY WITH PROPOFOL N/A 08/12/2016   Procedure: COLONOSCOPY WITH PROPOFOL;  Surgeon: Lollie Sails, MD;  Location[facility] ENDOSCOPY;  Service: Endoscopy;  Laterality: N/A;  . DIALYSIS FISTULA CREATION Left    upper arm  . dialysis grafts    . ESOPHAGOGASTRODUODENOSCOPY N/A 03/08/2015   Procedure: ESOPHAGOGASTRODUODENOSCOPY (EGD);  Surgeon: Manya Silvas, MD;  Location: [facility] ENDOSCOPY;  Service: Endoscopy;  Laterality: N/A;  . ESOPHAGOGASTRODUODENOSCOPY (EGD) WITH PROPOFOL N/A 03/18/2016   Procedure: ESOPHAGOGASTRODUODENOSCOPY (EGD) WITH PROPOFOL;  Surgeon: [name], MD;  Location: [facility] ENDOSCOPY;  Service: Endoscopy;  Laterality: N/A;  . EYE SURGERY Right 2018  . FECAL TRANSPLANT N/A 08/23/2015   Procedure: FECAL TRANSPLANT;  Surgeon: Manya Silvas, MD;  Location: [facility] ENDOSCOPY;  Service: Endoscopy;  Laterality: N/A;  . HAND SURGERY Bilateral   .  PERIPHERAL VASCULAR CATHETERIZATION N/A 12/20/2015   Procedure: Thrombectomy of dialysis access versus permcath placement;  Surgeon: Algernon Huxley, MD;  Location: [place] CV LAB;  Service: Cardiovascular;  Laterality: N/A;  . PERIPHERAL VASCULAR CATHETERIZATION N/A 12/20/2015   Procedure: A/V Shunt Intervention;  Surgeon: Algernon Huxley, MD;  Location: [place] CV LAB;  Service: Cardiovascular;  Laterality: N/A;  . PERIPHERAL VASCULAR CATHETERIZATION N/A 12/20/2015   Procedure: A/V Shuntogram/Fistulagram;  Surgeon: Algernon Huxley, MD;  Location: [place] CV LAB;  Service: Cardiovascular;  Laterality: N/A;  . PERIPHERAL VASCULAR  CATHETERIZATION N/A 01/02/2016   Procedure: A/V Shuntogram/Fistulagram;  Surgeon: Algernon Huxley, MD;  Location: [place] CV LAB;  Service: Cardiovascular;  Laterality: N/A;  . PERIPHERAL VASCULAR CATHETERIZATION N/A 01/02/2016   Procedure: A/V Shunt Intervention;  Surgeon: Algernon Huxley, MD;  Location: [place] CV LAB;  Service: Cardiovascular;  Laterality: N/A;    in for   Chief Complaint  Patient presents with  . Osteomyelitis     Social History Social History   Tobacco Use  . Smoking status: Former Smoker    Packs/day: 0.50    Years: 40.00    Pack years: 20.00    Types: Cigarettes    Last attempt to quit: 02/13/2015    Years since quitting: 2.6  . Smokeless tobacco: Never Used  Substance Use Topics  . Alcohol use: Yes    Alcohol/week: 0.6 oz    Types: 1 Glasses of wine per week    Comment: glass wine week per pt   Family History Family History  Problem Relation Age of Onset  . Kidney disease Mother   . Diabetes Mother   . Cancer Father   . Kidney disease Sister    Prior to Admission medications   Medication Sig Start Date End Date Taking? Authorizing Provider  albuterol (PROVENTIL HFA;VENTOLIN HFA) 108 (90 Base) MCG/ACT inhaler Inhale 2 puffs into the lungs every 6 (six) hours as needed for shortness of breath.    Yes [provider]  albuterol (PROVENTIL) (2.5 MG/3ML) 0.083% nebulizer solution Take 2.5 mg by nebulization every 6 (six) hours as needed for wheezing or shortness of breath.   Yes [provider]  alum & mag hydroxide-simeth (MAALOX MAX) 400-400-40 MG/5ML suspension Take 5 mLs by mouth every 6 (six) hours as needed for indigestion. 06/09/17  Yes Veronese, [place], MD  amLODipine (NORVASC) 10 MG tablet Take 10 mg by mouth daily.   Yes [provider]  aspirin EC 81 MG tablet Take 81 mg by mouth daily. 09/08/16  Yes [provider]  atorvastatin (LIPITOR) 20 MG tablet Take 20 mg by mouth at bedtime.  12/02/16  Yes  [provider]  carvedilol (COREG) 25 MG tablet Take 25 mg by mouth 2 (two) times daily with a meal.   Yes [provider]  cetirizine (ZYRTEC) 10 MG tablet Take 10 mg by mouth daily.    Yes [provider]  cholecalciferol (VITAMIN D) 400 units TABS tablet Take 400 Units by mouth daily.   Yes [provider]  ciprofloxacin (CIPRO) 500 MG tablet Take 500 mg by mouth 2 (two) times daily. 09/29/17 10/12/2017 Yes [provider]  clindamycin (CLEOCIN) 300 MG capsule Take 300 mg by mouth 2 (two) times daily. 09/29/17 10/01/2017 Yes [provider]  clopidogrel (PLAVIX) 75 MG tablet Take 1 tablet (75 mg total) by mouth daily. 03/25/17 03/25/18 Yes Dustin Flock, MD  cyanocobalamin 1000 MCG tablet Take 1,000  mcg by mouth daily with lunch.    Yes [provider]  dexlansoprazole (DEXILANT) 60 MG capsule Take 60 mg by mouth daily.   Yes [provider]  diphenhydrAMINE (BENADRYL) 25 mg capsule Take 25 mg by mouth every 6 (six) hours as needed for itching.   Yes [provider]  FLUoxetine (PROZAC) 20 MG capsule Take 1 capsule (20 mg total) by mouth daily. Patient taking differently: Take 20 mg by mouth at bedtime.  04/10/16  Yes Hower, Aaron Mose, MD  glucose 4 GM chewable tablet Chew 3-4 tablets by mouth as needed for low blood sugar (blood sugar less than 60).    Yes [provider]  HYDROcodone-acetaminophen (NORCO/VICODIN) 5-325 MG tablet Take 1 tablet by mouth every 6 (six) hours as needed for moderate pain or severe pain. Patient taking differently: Take 1 tablet by mouth 3 (three) times daily as needed for moderate pain or severe pain.  05/05/17  Yes Gouru, Illene Silver, MD  hydrOXYzine (ATARAX/VISTARIL) 25 MG tablet Take 25 mg by mouth every 6 (six) hours as needed.    Yes [provider]  insulin detemir (LEVEMIR) 100 UNIT/ML injection Inject 0.02 mLs (2 Units total) into the skin 2 (two) times daily. Patient taking  differently: Inject 4 Units into the skin at bedtime.  09/05/17  Yes Epifanio Lesches, MD  levofloxacin (LEVAQUIN) 750 MG tablet Take 750 mg by mouth every Monday, Wednesday, and Friday.   Yes [provider]  lipase/protease/amylase (CREON) 12000 UNITS CPEP capsule Take 24,000-36,000 Units by mouth 3 (three) times daily with meals. Take 36000 mg in the morning, 24000 midday & 36000 in the evening   Yes [provider]  meclizine (ANTIVERT) 12.5 MG tablet Take 1 tablet by mouth three times daily as needed   Yes [provider]  metoCLOPramide (REGLAN) 10 MG tablet Take 1 tablet (10 mg total) by mouth every 8 (eight) hours as needed for nausea or vomiting. Patient taking differently: Take 5 mg by mouth 4 (four) times daily -  with meals and at bedtime.  06/08/17  Yes Earleen Newport, MD  multivitamin (RENA-VIT) TABS tablet Take 1 tablet by mouth daily.    Yes [provider]  nitroGLYCERIN (NITROSTAT) 0.4 MG SL tablet Place 0.4 mg under the tongue every 5 (five) minutes x 3 doses as needed.   Yes [provider]  omega-3 acid ethyl esters (LOVAZA) 1 g capsule Take 1 g by mouth daily.    Yes [provider]  promethazine (PHENERGAN) 12.5 MG tablet Take 12.5 mg by mouth every 6 (six) hours as needed for nausea or vomiting.   Yes [provider]  ranitidine (ZANTAC) 150 MG tablet Take 150 mg by mouth 2 (two) times daily. In the afternoon   Yes [provider]  ranolazine (RANEXA) 500 MG 12 hr tablet Take 1,000 mg by mouth 2 (two) times daily.   Yes [provider]  rifaximin (XIFAXAN) 550 MG TABS tablet Take 550 mg by mouth 2 (two) times daily.   Yes [provider]  sevelamer carbonate (RENVELA) 800 MG tablet Take 800 mg by mouth 3 (three) times daily with meals.   Yes [provider]  sucralfate (CARAFATE) 1 g tablet Take 1 g by mouth 4 (four) times daily -  with meals and at bedtime.   Yes  [provider]  famotidine (PEPCID) 20 MG tablet Take 1 tablet (20 mg total) by mouth 2 (two) times daily. Patient not  taking: Reported on 09/30/2017 06/09/17 06/09/18  Rudene Re, MD    Anti-infectives (From admission, onward)   Start     Dose/Rate Route Frequency Ordered Stop   10/05/17 1600  doxycycline (VIBRA-TABS) tablet 100 mg     100 mg Oral Every 12 hours 10/05/17 1339     10/02/17 1200  vancomycin (VANCOCIN) IVPB 1000 mg/200 mL premix  Status:  Discontinued     1,000 mg 200 mL/hr over 60 Minutes Intravenous Every M-W-F (Hemodialysis) 09/30/17 1635 10/05/17 1440   10/01/17 1200  rifaximin (XIFAXAN) tablet 550 mg     550 mg Oral 2 times daily 10/01/17 1051     10/01/17 0200  piperacillin-tazobactam (ZOSYN) IVPB 3.375 g  Status:  Discontinued     3.375 g 12.5 mL/hr over 240 Minutes Intravenous Every 12 hours 09/30/17 1635 10/03/17 1351   09/30/17 1730  vancomycin (VANCOCIN) 2,000 mg in sodium chloride 0.9 % 500 mL IVPB     2,000 mg 250 mL/hr over 120 Minutes Intravenous  Once 09/30/17 1631 09/30/17 1937   09/30/17 1645  vancomycin (VANCOCIN) 2,000 mg in sodium chloride 0.9 % 500 mL IVPB  Status:  Discontinued     2,000 mg 250 mL/hr over 120 Minutes Intravenous  Once 09/30/17 1631 09/30/17 1631   09/30/17 1515  piperacillin-tazobactam (ZOSYN) IVPB 3.375 g     3.375 g 100 mL/hr over 30 Minutes Intravenous  Once 09/30/17 1507 09/30/17 1703   09/30/17 1515  vancomycin (VANCOCIN) IVPB 1000 mg/200 mL premix  Status:  Discontinued     1,000 mg 200 mL/hr over 60 Minutes Intravenous  Once 09/30/17 1507 09/30/17 1631      Scheduled Meds: . amLODipine  10 mg Oral Daily  . aspirin EC  81 mg Oral Daily  . atorvastatin  20 mg Oral QHS  . cholecalciferol  400 Units Oral Daily  . doxycycline  100 mg Oral Q12H  . famotidine  20 mg Oral BID  . FLUoxetine  20 mg Oral QHS  . heparin  5,000 Units Subcutaneous Q8H  . insulin aspart  0-9 Units Subcutaneous TID WC  .  lipase/protease/amylase  36,000 Units Oral TID WC  . multivitamin  1 tablet Oral Daily  . omega-3 acid ethyl esters  1 g Oral Daily  . pantoprazole  40 mg Oral Daily  . ranolazine  1,000 mg Oral BID  . rifaximin  550 mg Oral BID  . sucralfate  1 g Oral TID WC & HS  . cyanocobalamin  1,000 mcg Oral Q lunch   Continuous Infusions: PRN Meds:.acetaminophen, albuterol, alum & mag hydroxide-simeth, diphenhydrAMINE, docusate sodium, HYDROcodone-acetaminophen, hydrOXYzine, meclizine, nitroGLYCERIN, ondansetron (ZOFRAN) IV, promethazine  Allergies  Allergen Reactions  . Ace Inhibitors Swelling and Anaphylaxis  . Ativan [Lorazepam] Other (See Comments)    Reaction:Hallucinations and headaches Reaction:Hallucinations and headaches Reaction:  Hallucinations and headaches  . Compazine [Prochlorperazine Edisylate] Anaphylaxis and Nausea And Vomiting    Other reaction(s): dystonia from this vs. reglan 23 Jul - patient relates that she takes promethazine frequently with no problems. 23 Jul - patient relates that she takes promethazine frequently with no problems.  . Sumatriptan Succinate     Other reaction(s): delirium and hallucinations per [facility] records  . Dilaudid [Hydromorphone Hcl] Other (See Comments)    Delirium   . Ondansetron Other (See Comments)    hallucinations    . Zofran [Ondansetron Hcl] Other (See Comments)    Reaction:  hallucinations  Reaction:  hallucinations   . Codeine Nausea  And Vomiting  . Gabapentin Other (See Comments)    Reaction:  Unknown    . Lac Bovis Nausea And Vomiting  . Losartan Nausea Only  . Oxycodone Anxiety  . Prochlorperazine Other (See Comments)    Reaction:  Unknown . Patient does not remember reaction but she does have vertigo and anxiety along with n and v at times. Could be used to treat any of these   . Reglan [Metoclopramide]     Per patient her Dr. Evelina Bucy her off it  Per patient her Dr. Evelina Bucy her off it   . Scopolamine Other (See Comments)     Dizziness, also has vertigo already  . Tape Rash  . Tapentadol Rash    Physical Exam  Vitals  Blood pressure (!) 144/49, pulse 74, temperature 98.3 F (36.8 C), temperature source Oral, resp. rate 19, height 5' 5.5" (1.664 m), weight 82.2 kg (181 lb 3.2 oz), SpO2 98 %.  Lower Extremity exam: She is doing well with her left foot side surgical site.  She had no real pain or discomfort in the area she has not been able to get up much on due to her nausea.  She received her dialysis yesterday. Data Review  CBC Recent Labs  Lab 09/30/17 1555 10/01/17 0826 10/04/17 0424  WBC 5.8 5.1 4.5  HGB 10.4* 9.8* 9.4*  HCT 32.0* 30.8* 29.3*  PLT 154 165 166  MCV 92.4 92.5 92.1  MCH 30.0 29.4 29.7  MCHC 32.4 31.8* 32.3  RDW 14.6* 14.9* 14.9*  LYMPHSABS 1.8  --   --   MONOABS 0.8  --   --   EOSABS 0.4  --   --   BASOSABS 0.0  --   --    ------------------------------------------------------------------------------------------------------------------  Chemistries  Recent Labs  Lab 09/30/17 1555 10/01/17 0826  NA 133* 135  K 3.9 4.3  CL 95* 96*  CO2 28 28  GLUCOSE 191* 101*  BUN 8 10  CREATININE 2.91* 4.32*  CALCIUM 7.8* 7.7*   --------------------------------------------------------------------------------Assessment & Plan: From my perspective she is stable and I does need to see her next week to change the dressing.  Likely Tuesday or Wednesday of next week will be fine.  This needs to schedule at my office.  On her discharge dressing needs to stay dry clean and intact and she needs to wear the postoperative shoe anytime she gets up to go to the bathroom or ambulate.  Principal Problem:   Osteomyelitis [facility])   Family Communication: Plan discussed with patient    Albertine Patricia M.D on 10/13/2017 at 7:58 AM  Thank you for the consult, we will follow the patient with you in the Hospital.

## 2017-10-06 NOTE — Care Management Note (Addendum)
Case Management Note  Patient Details  Name: [name] MRN: [id number] Date of Birth: 1961-01-08  Subjective/Objective:  Met with patient at bedside. Offered choice of home health verses SNF. She states she has a sister that she lives with. The sister just was released from the hospital. Patient decided to go home. She has used [facility] in the past and would like to use them again. Referral to Sharmon Revere with [facility]. Patient wants to be transported home with an ambulance. She has a cane that she uses at home for ambulation. She has a walker and a wheelchair. Spoke with PT, Gal[place]. Requested he go see patient prior to discharge.  PCP is Freddy Finner.  Medical necessity completed. CSW updated                 Action/Plan: [facility] for SN, PT, SW and HHA  Expected Discharge Date:  09/28/2017               Expected Discharge Plan:  [place]  In-House Referral:     Discharge planning Services  CM Consult  Post Acute Care Choice:  Home Health Choice offered to:  Patient  DME Arranged:    DME Agency:     HH Arranged:  RN, PT, Nurse's Aide, Social Work [employer] Agency:  [employer]  Status of Service:  Completed, signed off  If discussed at [employer], dates discussed:    Additional Comments:  Jolly Mango, RN 10/14/2017, 1:54 PM

## 2017-10-06 NOTE — Progress Notes (Signed)
[facility], [place] 10/11/2017  Subjective:   Patient is trying clears today.  She states that she is still nauseous and had episodes of vomiting She is sitting up in the bed  No leg edema No shortness of breath   Objective:  Vital signs in last 24 hours:  Temp:  [98 F (36.7 C)-98.3 F (36.8 C)] 98 F (36.7 C) (01/15 0820) Pulse Rate:  [74-81] 79 (01/15 0820) Resp:  [18-19] 18 (01/15 0820) BP: (144-180)/(49-66) 167/64 (01/15 0820) SpO2:  [96 %-100 %] 100 % (01/15 0820) Weight:  [82.2 kg (181 lb 3.2 oz)] 82.2 kg (181 lb 3.2 oz) (01/15 0439)  Weight change:  Filed Weights   10/02/17 1410 10/05/17 0945 10/19/2017 0439  Weight: 98.1 kg (216 lb 4.3 oz) 86.4 kg (190 lb 7.6 oz) 82.2 kg (181 lb 3.2 oz)    Intake/Output:    Intake/Output Summary (Last 24 hours) at 09/25/2017 1540 Last data filed at 10/05/2017 1900 Gross per 24 hour  Intake 0 ml  Output -  Net 0 ml     Physical Exam: General: Looks better compared to yesterday  HEENT Moist oral mucus membranes  Neck supple  Pulm/lungs Normal effort, clear  CVS/Heart regular  Abdomen:  Soft,   Extremities: Trace edema, left foot bandaged  Neurologic: Alert, oriented, decreased hearing  Skin: No rashes  Access: AVF       Basic Metabolic Panel:  Recent Labs  Lab 09/30/17 1555 10/01/17 0826  NA 133* 135  K 3.9 4.3  CL 95* 96*  CO2 28 28  GLUCOSE 191* 101*  BUN 8 10  CREATININE 2.91* 4.32*  CALCIUM 7.8* 7.7*     CBC: Recent Labs  Lab 09/30/17 1555 10/01/17 0826 10/04/17 0424  WBC 5.8 5.1 4.5  NEUTROABS 2.8  --   --   HGB 10.4* 9.8* 9.4*  HCT 32.0* 30.8* 29.3*  MCV 92.4 92.5 92.1  PLT 154 165 166      Lab Results  Component Value Date   HEPBSAG Negative 05/04/2017   HEPBIGM  06/12/2010    NEGATIVE (NOTE) High levels of Hepatitis B Core IgM antibody are detectable during the acute stage of Hepatitis B. This antibody is used to differentiate current from past HBV  infection.       Microbiology:  Recent Results (from the past 240 hour(s))  Wound or Superficial Culture     Status: None   Collection Time: 09/30/17  3:10 PM  Result Value Ref Range Status   Specimen Description   Final    TOE LEFT Performed at [facility], [address] [place], [place] [address]    Special Requests   Final    NONE Performed at [facility], [address] [place], [place] [address]    Gram Stain   Final    RARE WBC PRESENT, PREDOMINANTLY MONONUCLEAR RARE GRAM POSITIVE COCCI Performed at [facility], [place] [address], [place], [place] [address]    Culture FEW STAPHYLOCOCCUS AUREUS  Final   Report Status 10/03/2017 FINAL  Final   Organism ID, Bacteria STAPHYLOCOCCUS AUREUS  Final      Susceptibility   Staphylococcus aureus - MIC*    CIPROFLOXACIN <=0.5 SENSITIVE Sensitive     ERYTHROMYCIN >=8 RESISTANT Resistant     GENTAMICIN <=0.5 SENSITIVE Sensitive     OXACILLIN 0.5 SENSITIVE Sensitive     TETRACYCLINE <=1 SENSITIVE Sensitive     VANCOMYCIN 1 SENSITIVE Sensitive  TRIMETH/SULFA <=10 SENSITIVE Sensitive     CLINDAMYCIN <=0.25 SENSITIVE Sensitive     RIFAMPIN <=0.5 SENSITIVE Sensitive     Inducible Clindamycin NEGATIVE Sensitive     * FEW STAPHYLOCOCCUS AUREUS  MRSA PCR Screening     Status: None   Collection Time: 09/30/17  7:55 PM  Result Value Ref Range Status   MRSA by PCR NEGATIVE NEGATIVE Final    Comment:        The GeneXpert MRSA Assay (FDA approved for NASAL specimens only), is one component of a comprehensive MRSA colonization surveillance program. It is not intended to diagnose MRSA infection nor to guide or monitor treatment for MRSA infections. Performed at [facility], [address] [place], [place] [address]   Aerobic/Anaerobic Culture (surgical/deep wound)     Status: None (Preliminary result)   Collection Time: 10/02/17  8:02 AM  Result Value Ref Range Status   Specimen  Description   Final    BONE Performed at [facility], [address], [place], [place] [address]    Special Requests   Final    GREAT TOE Performed at [facility] [address] [place], [place] [address]    Gram Stain   Final    RARE WBC PRESENT, PREDOMINANTLY MONONUCLEAR NO ORGANISMS SEEN Performed at [facility], [place] [address] [place], [place] [address]    Culture   Final    FEW STAPHYLOCOCCUS AUREUS SUSCEPTIBILITIES PERFORMED ON PREVIOUS CULTURE WITHIN THE LAST 5 DAYS. FEW STAPHYLOCOCCUS LUGDUNENSIS CRITICAL RESULT CALLED TO, READ BACK BY AND VERIFIED WITH: C GROGAN,RN AT 1230 10/03/17 BY L BENFIELD CONCERNING GROWTH ON CULTURE NO ANAEROBES ISOLATED; CULTURE IN PROGRESS FOR 5 DAYS    Report Status PENDING  Incomplete   Organism ID, Bacteria STAPHYLOCOCCUS LUGDUNENSIS  Final      Susceptibility   Staphylococcus lugdunensis - MIC*    CIPROFLOXACIN <=0.5 SENSITIVE Sensitive     ERYTHROMYCIN >=8 RESISTANT Resistant     GENTAMICIN <=0.5 SENSITIVE Sensitive     OXACILLIN 0.5 SENSITIVE Sensitive     TETRACYCLINE <=1 SENSITIVE Sensitive     VANCOMYCIN <=0.5 SENSITIVE Sensitive     TRIMETH/SULFA <=10 SENSITIVE Sensitive     CLINDAMYCIN >=8 RESISTANT Resistant     RIFAMPIN <=0.5 SENSITIVE Sensitive     Inducible Clindamycin NEGATIVE Sensitive     * FEW STAPHYLOCOCCUS LUGDUNENSIS    Coagulation Studies: No results for input(s): LABPROT, INR in the last 72 hours.  Urinalysis: No results for input(s): COLORURINE, LABSPEC, PHURINE, GLUCOSEU, HGBUR, BILIRUBINUR, KETONESUR, PROTEINUR, UROBILINOGEN, NITRITE, LEUKOCYTESUR in the last 72 hours.  Invalid input(s): APPERANCEUR    Imaging: No results found.   Medications:    . amLODipine  10 mg Oral Daily  . aspirin EC  81 mg Oral Daily  . atorvastatin  20 mg Oral QHS  . cholecalciferol  400 Units Oral Daily  . doxycycline  100 mg Oral Q12H  . famotidine  20 mg Oral BID  . FLUoxetine  20 mg  Oral QHS  . heparin  5,000 Units Subcutaneous Q8H  . insulin aspart  0-9 Units Subcutaneous TID WC  . lipase/protease/amylase  36,000 Units Oral TID WC  . multivitamin  1 tablet Oral Daily  . omega-3 acid ethyl esters  1 g Oral Daily  . pantoprazole  40 mg Oral Daily  . ranolazine  1,000 mg Oral BID  . rifaximin  550 mg Oral BID  . sucralfate  1 g Oral TID  WC & HS  . cyanocobalamin  1,000 mcg Oral Q lunch   acetaminophen, albuterol, alum & mag hydroxide-simeth, diphenhydrAMINE, docusate sodium, HYDROcodone-acetaminophen, hydrOXYzine, meclizine, nitroGLYCERIN, ondansetron (ZOFRAN) IV, promethazine  Assessment/ Plan:  57 y.o. female with diabetes, hypertension, hyperlipidemia, diabetic retinopathy, diabetic neuropathy, diabetic gastroparesis, pancreatic insufficiency, depression, GERD. Renal masses, M-spike   MWF [facility] [place], Right AVG  1. End Stage Renal Disease: hemodialysis MWF. Continue HD treatments as scheduled  2.  Gastroparesis causing nausea and vomiting Symptomatic control  Patient is trying clears today  3. Anemia of chronic kidney disease: hemoglobin 9.4 - [facility] as outpatient.   4. Secondary Hyperparathyroidism with hyperphosphatemia and hypocalcemia -  HOLD sevelamer until nausea and vomiting are resolved     LOS: [place] 1/15/20193:40 PM  [place], [place]

## 2017-10-06 NOTE — Clinical Social Work Note (Addendum)
Clinical Social Work Assessment  Patient Details  Name: [name] MRN: [id number] Date of Birth: Feb 06, 1961  Date of referral:  10/03/2017               Reason for consult:  Facility Placement, Discharge Planning                Permission sought to share information with:  Chartered certified accountant granted to share information::     Name::      [place]::   [place]  Relationship::     Contact Information:     Housing/Transportation Living arrangements for the past 2 months:  [place] of Information:  Patient, Adult Children Patient Interpreter Needed:  None Criminal Activity/Legal Involvement Pertinent to Current Situation/Hospitalization:  No - Comment as needed Significant Relationships:  Adult Children Lives with:    Do you feel safe going back to the place where you live?  Yes Need for family participation in patient care:  Yes (Comment)  Care giving concerns:  Patient lives in [place] with her sister [name] [phone]) and nephew.   Social Worker assessment / plan: Holiday representative (CSW) received verbal consult from RN case manager that patient wants to go to a SNF. Social work Theatre manager met with patient alone at bedside. Patient was asleep but easy to wake and was alert and oriented x4. Social work Theatre manager introduced self and explained the role of the [place]. Patient stated that she lives in [place] with her sister [name] and nephew but [name] [phone]) and [name] are her primary contact. Patient shared that she receives dialysis treatment at [facility] in [place] on Monday, Wednesday, Friday between 9:30am-10am. Social work Theatre manager explained that medicare requires a 3 night inpatient qualifying hospital stay. Patient was admitted on 09/30/17. PT is pending. Patient verbalized her understanding and prefers to go home pending PT. CSW called patient's [name] and he stated that he is  comfortable with patient's decision. CSW and social work Theatre manager will continue to follow up and assist.  Employment status:  Retired Nurse, adult, Medicaid In [place] PT Recommendations:  Not assessed at this time Information / Referral to community resources:  [place]  Patient/Family's Response to care:  Patient prefers to go home pending PT's recommendation.  Patient/Family's Understanding of and Emotional Response to Diagnosis, Current Treatment, and Prognosis: Patient and her son were both pleasant and thanked CSW and social work Theatre manager for their assistance.  Emotional Assessment Appearance:  Appears stated age Attitude/Demeanor/Rapport:    Affect (typically observed):  Adaptable, Calm, Pleasant Orientation:  Oriented to Self, Oriented to Place, Oriented to  Time, Oriented to Situation Alcohol / Substance use:  Not Applicable Psych involvement (Current and /or in the community):  No (Comment)  Discharge Needs  Concerns to be addressed:  Care Coordination, Discharge Planning Concerns Readmission within the last 30 days:  No Current discharge risk:  Dependent with Mobility Barriers to Discharge:  Continued Medical Work up   Smith Mince, Student-Social Work 10/10/2017, 10:25 AM

## 2017-10-06 NOTE — Discharge Summary (Signed)
[facility] NAME: [name]    MR#:  [id number]  DATE OF BIRTH:  1961/08/18  DATE OF ADMISSION:  09/30/2017 ADMITTING PHYSICIAN: Vaughan Basta, MD  DATE OF DISCHARGE: No discharge date for patient encounter.  PRIMARY CARE PHYSICIAN: Freddy Finner, NP    ADMISSION DIAGNOSIS:  End-stage renal disease on hemodialysis ([place]) [N18.6, Z99.2] Acute osteomyelitis of toe of left foot ([place]) [M86.172] Acute pain of right knee [M25.561]  DISCHARGE DIAGNOSIS:  Principal Problem:   Osteomyelitis ([place])   SECONDARY DIAGNOSIS:   Past Medical History:  Diagnosis Date  . Anemia   . Anginal pain ([place])   . Anxiety   . Arthritis   . Asthma   . Broken wrist   . Bronchitis   . chronic diastolic CHF 9/76/7341  . COPD (chronic obstructive pulmonary disease) ([place])   . Coronary artery disease    a. cath 2013: stenting to RCA (report not available); b. cath 2014: LM nl, pLAD 40%, mLAD nl, ost LCx 40%, mid LCx nl, pRCA 30% @ site of prior stent, mRCA 50%  . Depression   . Diabetes mellitus without complication ([place])   . Diabetic neuropathy ([place])   . dialysis 2006  . Diverticulosis   . Dizziness   . Dyspnea   . Elevated lipids   . Environmental and seasonal allergies   . ESRD (end stage renal disease) on dialysis ([place])    M-W-F  . GERD (gastroesophageal reflux disease)   . Headache   . History of hiatal hernia   . HOH (hard of hearing)   . Hx of pancreatitis 2015  . Hypertension   . Lower extremity edema   . Mitral regurgitation    a. echo 10/2013: EF 62%, noWMA, mildly dilated LA, mild to mod MR/TR, GR1DD  . Myocardial infarction ([place])   . Orthopnea   . Pneumonia   . Renal cancer ([place])   . Renal insufficiency    Pt is on dialysis on M,W + F.  . [place]:    1acute left great toe osteomyelitis Resolving Treated initially with empiric IV vancomycin - in d/w ID/Dr. Ola Spurr- to continue on  doxycycline 100 mg p.o. twice daily for 28-day course/to f/u s/p discharge for re-evaluation in clinic,wound cultures noted forStaph aureus/lugdunensis-sensitivitiesnoted,s/pleft great toe amputation 10/02/2017 by podiatry   2chronic diabetes mellitus type 2  Stable on current regiment  3chronic benign essential hypertension  Stable Blood pressure medication had to be held during the initial part of her hospital stay due to relative hypotension, medications were resumed on day of discharge  4chronic ESRD Stable On hemodialysisMWF [facility]phrology did see patient while in house   5history of CAD Stable Restarted on Plavix   6acute right knee pain  Stable  X-rays negative for acute fracture orthopedic surgery input greatly appreciated-follow-up as an outpatient for possibleSynvisc injection  7acute nausea/emesis Stable  Improved with Phenergan-multiple allergies to antiemetics noted  Acute abdominal serieswas unimpressive     DISCHARGE CONDITIONS:  On day of discharge patient is afebrile, he did not stable, tolerating diet, ready for discharge home with appropriate follow-up with primary care provider in 2-3 days for reevaluation, podiatry in 1-2 weeks status post left great toe amputation, follow-up with infectious disease provider in 2-3 weeks, for more specific details please see chart     CONSULTS OBTAINED:  Treatment Team:  Leonel Ramsay, MD  DRUG ALLERGIES:   Allergies  Allergen Reactions  . Ace Inhibitors Swelling and Anaphylaxis  . Ativan [Lorazepam] Other (See Comments)    Reaction:Hallucinations and headaches Reaction:Hallucinations and headaches Reaction:  Hallucinations and headaches  . Compazine [Prochlorperazine Edisylate] Anaphylaxis and Nausea And Vomiting    Other reaction(s): dystonia from this vs. reglan 23 Jul - patient relates that she takes promethazine frequently with no problems. 23 Jul - patient relates that  she takes promethazine frequently with no problems.  . Sumatriptan Succinate     Other reaction(s): delirium and hallucinations per [facility] records  . Dilaudid [Hydromorphone Hcl] Other (See Comments)    Delirium   . Ondansetron Other (See Comments)    hallucinations    . Zofran [Ondansetron Hcl] Other (See Comments)    Reaction:  hallucinations  Reaction:  hallucinations   . Codeine Nausea And Vomiting  . Gabapentin Other (See Comments)    Reaction:  Unknown    . Lac Bovis Nausea And Vomiting  . Losartan Nausea Only  . Oxycodone Anxiety  . Prochlorperazine Other (See Comments)    Reaction:  Unknown . Patient does not remember reaction but she does have vertigo and anxiety along with n and v at times. Could be used to treat any of these   . Reglan [Metoclopramide]     Per patient her Dr. Evelina Bucy her off it  Per patient her Dr. Evelina Bucy her off it   . Scopolamine Other (See Comments)    Dizziness, also has vertigo already  . Tape Rash  . Tapentadol Rash    DISCHARGE MEDICATIONS:   Allergies as of 09/24/2017      Reactions   Ace Inhibitors Swelling, Anaphylaxis   Ativan [lorazepam] Other (See Comments)   Reaction:Hallucinations and headaches Reaction:Hallucinations and headaches Reaction:  Hallucinations and headaches   Compazine [prochlorperazine Edisylate] Anaphylaxis, Nausea And Vomiting   Other reaction(s): dystonia from this vs. reglan 23 Jul - patient relates that she takes promethazine frequently with no problems. 23 Jul - patient relates that she takes promethazine frequently with no problems.   Sumatriptan Succinate    Other reaction(s): delirium and hallucinations per [facility] records   Dilaudid [hydromorphone Hcl] Other (See Comments)   Delirium   Ondansetron Other (See Comments)   hallucinations    Zofran [ondansetron Hcl] Other (See Comments)   Reaction:  hallucinations  Reaction:  hallucinations    Codeine Nausea And Vomiting   Gabapentin Other (See Comments)    Reaction:  Unknown    Lac Bovis Nausea And Vomiting   Losartan Nausea Only   Oxycodone Anxiety   Prochlorperazine Other (See Comments)   Reaction:  Unknown . Patient does not remember reaction but she does have vertigo and anxiety along with n and v at times. Could be used to treat any of these   Reglan [metoclopramide]    Per patient her Dr. Evelina Bucy her off it  Per patient her Dr. Evelina Bucy her off it    Scopolamine Other (See Comments)   Dizziness, also has vertigo already   Tape Rash   Tapentadol Rash      Medication List    STOP taking these medications   ciprofloxacin 500 MG tablet Commonly known as:  CIPRO   clindamycin 300 MG capsule Commonly known as:  CLEOCIN   levofloxacin 750 MG tablet Commonly known as:  LEVAQUIN     TAKE these medications   albuterol 108 (90 Base) MCG/ACT inhaler Commonly known as:  PROVENTIL HFA;VENTOLIN HFA Inhale 2 puffs into the lungs every 6 (  six) hours as needed for shortness of breath.   albuterol (2.5 MG/3ML) 0.083% nebulizer solution Commonly known as:  PROVENTIL Take 2.5 mg by nebulization every 6 (six) hours as needed for wheezing or shortness of breath.   alum & mag hydroxide-simeth 657-846-96 MG/5ML suspension Commonly known as:  MAALOX MAX Take 5 mLs by mouth every 6 (six) hours as needed for indigestion.   amLODipine 10 MG tablet Commonly known as:  NORVASC Take 10 mg by mouth daily.   aspirin EC 81 MG tablet Take 81 mg by mouth daily.   atorvastatin 20 MG tablet Commonly known as:  LIPITOR Take 20 mg by mouth at bedtime.   carvedilol 25 MG tablet Commonly known as:  COREG Take 25 mg by mouth 2 (two) times daily with a meal.   cetirizine 10 MG tablet Commonly known as:  ZYRTEC Take 10 mg by mouth daily.   cholecalciferol 400 units Tabs tablet Commonly known as:  VITAMIN D Take 400 Units by mouth daily.   clopidogrel 75 MG tablet Commonly known as:  PLAVIX Take 1 tablet (75 mg total) by mouth daily.    cyanocobalamin 1000 MCG tablet Take 1,000 mcg by mouth daily with lunch.   DEXILANT 60 MG capsule Generic drug:  dexlansoprazole Take 60 mg by mouth daily.   diphenhydrAMINE 25 mg capsule Commonly known as:  BENADRYL Take 25 mg by mouth every 6 (six) hours as needed for itching.   doxycycline 100 MG tablet Commonly known as:  VIBRA-TABS Take 1 tablet (100 mg total) by mouth every 12 (twelve) hours.   famotidine 20 MG tablet Commonly known as:  PEPCID Take 1 tablet (20 mg total) by mouth 2 (two) times daily.   FLUoxetine 20 MG capsule Commonly known as:  PROZAC Take 1 capsule (20 mg total) by mouth daily. What changed:  when to take this   glucose 4 GM chewable tablet Chew 3-4 tablets by mouth as needed for low blood sugar (blood sugar less than 60).   HYDROcodone-acetaminophen 5-325 MG tablet Commonly known as:  NORCO/VICODIN Take 1 tablet by mouth every 6 (six) hours as needed for moderate pain or severe pain. What changed:  when to take this   hydrOXYzine 25 MG tablet Commonly known as:  ATARAX/VISTARIL Take 25 mg by mouth every 6 (six) hours as needed.   insulin detemir 100 UNIT/ML injection Commonly known as:  LEVEMIR Inject 0.02 mLs (2 Units total) into the skin 2 (two) times daily. What changed:    how much to take  when to take this   lipase/protease/amylase 12000 units Cpep capsule Commonly known as:  CREON Take 24,000-36,000 Units by mouth 3 (three) times daily with meals. Take 36000 mg in the morning, 24000 midday & 36000 in the evening   meclizine 12.5 MG tablet Commonly known as:  ANTIVERT Take 1 tablet by mouth three times daily as needed   metoCLOPramide 10 MG tablet Commonly known as:  REGLAN Take 1 tablet (10 mg total) by mouth every 8 (eight) hours as needed for nausea or vomiting. What changed:    how much to take  when to take this   multivitamin Tabs tablet Take 1 tablet by mouth daily.   nitroGLYCERIN 0.4 MG SL tablet Commonly  known as:  NITROSTAT Place 0.4 mg under the tongue every 5 (five) minutes x 3 doses as needed.   omega-3 acid ethyl esters 1 g capsule Commonly known as:  LOVAZA Take 1 g by mouth daily.  promethazine 12.5 MG tablet Commonly known as:  PHENERGAN Take 1 tablet (12.5 mg total) by mouth every 6 (six) hours as needed for nausea or vomiting.   ranitidine 150 MG tablet Commonly known as:  ZANTAC Take 150 mg by mouth 2 (two) times daily. In the afternoon   ranolazine 500 MG 12 hr tablet Commonly known as:  RANEXA Take 1,000 mg by mouth 2 (two) times daily.   rifaximin 550 MG Tabs tablet Commonly known as:  XIFAXAN Take 550 mg by mouth 2 (two) times daily.   sevelamer carbonate 800 MG tablet Commonly known as:  RENVELA Take 800 mg by mouth 3 (three) times daily with meals.   sucralfate 1 g tablet Commonly known as:  CARAFATE Take 1 g by mouth 4 (four) times daily -  with meals and at bedtime.        DISCHARGE INSTRUCTIONS:    If you experience worsening of your admission symptoms, develop shortness of breath, life threatening emergency, suicidal or homicidal thoughts you must seek medical attention immediately by calling 911 or calling your MD immediately  if symptoms less severe.  You Must read complete instructions/literature along with all the possible adverse reactions/side effects for all the Medicines you take and that have been prescribed to you. Take any new Medicines after you have completely understood and accept all the possible adverse reactions/side effects.   Please note  You were cared for by a hospitalist during your hospital stay. If you have any questions about your discharge medications or the care you received while you were in the hospital after you are discharged, you can call the unit and asked to speak with the hospitalist on call if the hospitalist that took care of you is not available. Once you are discharged, your primary care physician will handle any  further medical issues. Please note that NO REFILLS for any discharge medications will be authorized once you are discharged, as it is imperative that you return to your primary care physician (or establish a relationship with a primary care physician if you do not have one) for your aftercare needs so that they can reassess your need for medications and monitor your lab values.    Today   CHIEF COMPLAINT:   Chief Complaint  Patient presents with  . Osteomyelitis     HISTORY OF PRESENT ILLNESS:   57 y.o.femalewith a known history of COPD, ESRD, DM, CAD, Diastolic CHF- had podiatry did nail cutting last week, had some bleed after that. Applied dressing and ointment. Yesterday she noted discoloration of her toe and some pain. Went to PMD- he gave oral Abx and sent for Xray to hospital. She was noted to have Osteomyelitis on Xray so PMD called her to go to ER. Received her regular HD today.  She had a fall 2 weeks ago and since than her right knee is hurting. She tried all OTC pain meds and icy hot, but not helping .    VITAL SIGNS:  Blood pressure (!) 167/64, pulse 79, temperature 98 F (36.7 C), temperature source Oral, resp. rate 18, height 5' 5.5" (1.664 m), weight 82.2 kg (181 lb 3.2 oz), SpO2 100 %.  I/O:    Intake/Output Summary (Last 24 hours) at 10/17/2017 1419 Last data filed at 10/05/2017 1900 Gross per 24 hour  Intake 0 ml  Output -  Net 0 ml    PHYSICAL EXAMINATION:  GENERAL:  57 y.o.-year-old patient lying in the bed with no acute distress.  EYES: Pupils equal, round, reactive to light and accommodation. No scleral icterus. Extraocular muscles intact.  HEENT: Head atraumatic, normocephalic. Oropharynx and nasopharynx clear.  NECK:  Supple, no jugular venous distention. No thyroid enlargement, no tenderness.  LUNGS: Normal breath sounds bilaterally, no wheezing, rales,rhonchi or crepitation. No use of accessory muscles of respiration.  CARDIOVASCULAR: S1, S2  normal. No murmurs, rubs, or gallops.  ABDOMEN: Soft, non-tender, non-distended. Bowel sounds present. No organomegaly or mass.  EXTREMITIES: No pedal edema, cyanosis, or clubbing.  NEUROLOGIC: Cranial nerves II through XII are intact. Muscle strength 5/5 in all extremities. Sensation intact. Gait not checked.  PSYCHIATRIC: The patient is alert and oriented x 3.  SKIN: No obvious rash, lesion, or ulcer.   DATA REVIEW:   CBC Recent Labs  Lab 10/04/17 0424  WBC 4.5  HGB 9.4*  HCT 29.3*  PLT 166    Chemistries  Recent Labs  Lab 10/01/17 0826  NA 135  K 4.3  CL 96*  CO2 28  GLUCOSE 101*  BUN 10  CREATININE 4.32*  CALCIUM 7.7*    Cardiac Enzymes No results for input(s): TROPONINI in the last 168 hours.  Microbiology Results  Results for orders placed or performed during the hospital encounter of 09/30/17  Wound or Superficial Culture     Status: None   Collection Time: 09/30/17  3:10 PM  Result Value Ref Range Status   Specimen Description   Final    TOE LEFT Performed at [facility], [address], [place], [place] [address]    Special Requests   Final    NONE Performed at [facility], [address] [place], [place] [address]    Gram Stain   Final    RARE WBC PRESENT, PREDOMINANTLY MONONUCLEAR RARE GRAM POSITIVE COCCI Performed at [facility], [place] [address], [place], [place] [address]    Culture FEW STAPHYLOCOCCUS AUREUS  Final   Report Status 10/03/2017 FINAL  Final   Organism ID, Bacteria STAPHYLOCOCCUS AUREUS  Final      Susceptibility   Staphylococcus aureus - MIC*    CIPROFLOXACIN <=0.5 SENSITIVE Sensitive     ERYTHROMYCIN >=8 RESISTANT Resistant     GENTAMICIN <=0.5 SENSITIVE Sensitive     OXACILLIN 0.5 SENSITIVE Sensitive     TETRACYCLINE <=1 SENSITIVE Sensitive     VANCOMYCIN 1 SENSITIVE Sensitive     TRIMETH/SULFA <=10 SENSITIVE Sensitive     CLINDAMYCIN <=0.25 SENSITIVE Sensitive     RIFAMPIN <=0.5 SENSITIVE  Sensitive     Inducible Clindamycin NEGATIVE Sensitive     * FEW STAPHYLOCOCCUS AUREUS  MRSA PCR Screening     Status: None   Collection Time: 09/30/17  7:55 PM  Result Value Ref Range Status   MRSA by PCR NEGATIVE NEGATIVE Final    Comment:        The GeneXpert MRSA Assay (FDA approved for NASAL specimens only), is one component of a comprehensive MRSA colonization surveillance program. It is not intended to diagnose MRSA infection nor to guide or monitor treatment for MRSA infections. Performed at [facility], [address] [place], [place] [address]   Aerobic/Anaerobic Culture (surgical/deep wound)     Status: None (Preliminary result)   Collection Time: 10/02/17  8:02 AM  Result Value Ref Range Status   Specimen Description   Final    BONE Performed at [facility], [address], [place], [place] [address]    Special Requests   Final  GREAT TOE Performed at [facility], [address] [place], [place] [address]    Gram Stain   Final    RARE WBC PRESENT, PREDOMINANTLY MONONUCLEAR NO ORGANISMS SEEN Performed at [facility], [place] [address] [place], [place] [address]    Culture   Final    FEW STAPHYLOCOCCUS AUREUS SUSCEPTIBILITIES PERFORMED ON PREVIOUS CULTURE WITHIN THE LAST 5 DAYS. FEW STAPHYLOCOCCUS LUGDUNENSIS CRITICAL RESULT CALLED TO, READ BACK BY AND VERIFIED WITH: C GROGAN,RN AT 1230 10/03/17 BY L BENFIELD CONCERNING GROWTH ON CULTURE NO ANAEROBES ISOLATED; CULTURE IN PROGRESS FOR 5 DAYS    Report Status PENDING  Incomplete   Organism ID, Bacteria STAPHYLOCOCCUS LUGDUNENSIS  Final      Susceptibility   Staphylococcus lugdunensis - MIC*    CIPROFLOXACIN <=0.5 SENSITIVE Sensitive     ERYTHROMYCIN >=8 RESISTANT Resistant     GENTAMICIN <=0.5 SENSITIVE Sensitive     OXACILLIN 0.5 SENSITIVE Sensitive     TETRACYCLINE <=1 SENSITIVE Sensitive     VANCOMYCIN <=0.5 SENSITIVE Sensitive     TRIMETH/SULFA <=10 SENSITIVE  Sensitive     CLINDAMYCIN >=8 RESISTANT Resistant     RIFAMPIN <=0.5 SENSITIVE Sensitive     Inducible Clindamycin NEGATIVE Sensitive     * FEW STAPHYLOCOCCUS LUGDUNENSIS    RADIOLOGY:  No results found.  EKG:   Orders placed or performed during the hospital encounter of 09/03/17  . ED EKG  . ED EKG  . EKG 12-Lead  . EKG 12-Lead  . EKG      Management plans discussed with the patient, family and they are in agreement.  CODE STATUS:     Code Status Orders  (From admission, onward)        Start     Ordered   09/30/17 1804  Full code  Continuous     09/30/17 1803    Code Status History    Date Active Date Inactive Code Status Order ID Comments User Context   09/03/2017 22:43 09/05/2017 19:01 Full Code [id number]  Lance Coon, MD Inpatient   05/04/2017 18:33 05/05/2017 23:30 Full Code [id number]  Henreitta Leber, MD Inpatient   03/23/2017 15:51 03/25/2017 17:39 Full Code [id number]  Gladstone Lighter, MD Inpatient   04/21/2016 15:59 04/21/2016 17:17 Full Code [id number]  Nicholes Mango, MD Inpatient   04/08/2016 14:15 04/11/2016 19:47 Full Code [id number]  Theodoro Grist, MD Inpatient   03/15/2016 13:22 03/20/2016 00:19 Full Code [id number]  Bettey Costa, MD Inpatient   02/16/2016 14:09 02/19/2016 22:38 Full Code [id number]  Bettey Costa, MD ED   12/17/2015 11:39 12/21/2015 22:09 Full Code [id number]  Dustin Flock, MD ED   11/12/2015 16:02 12/01/2015 19:43 Full Code [id number]  Bettey Costa, MD Inpatient   10/07/2015 09:37 10/12/2015 19:59 Full Code [id number]  Fritzi Mandes, MD ED   10/03/2015 03:55 10/05/2015 22:30 Full Code [id number]  Lance Coon, MD Inpatient   08/31/2015 16:00 09/03/2015 22:09 Full Code [id number]  Demetrios Loll, MD ED   07/26/2015 15:28 07/26/2015 21:36 Full Code [id number]  Dionisio David, MD Inpatient   07/22/2015 17:36 07/26/2015 15:28 Full Code [id number]  Vaughan Basta, MD Inpatient   06/05/2015 20:39 06/07/2015 20:03 Full Code [id number]  Bettey Costa, MD Inpatient    05/21/2015 21:42 05/24/2015 16:26 Full Code [id number]  Henreitta Leber, MD Inpatient   04/24/2015 08:55 04/26/2015 22:11 Full Code [id number]  Demetrios Loll, MD Inpatient   04/01/2015 15:58 04/05/2015 20:07 Full Code [id number]  Epifanio Lesches, MD ED   03/06/2015 08:05  03/09/2015 21:11 Full Code [id number]  Juluis Mire, MD Inpatient   02/19/2015 16:14 02/24/2015 17:41 Full Code [id number]  Aldean Jewett, MD Inpatient      TOTAL TIME TAKING CARE OF THIS PATIENT: 45 minutes.    Avel Peace Crist Kruszka M.D on 10/01/2017 at 2:19 PM  Between 7am to 6pm - Pager - [phone]  After 6pm go to [email or web address] - password [email or web address] [facility]  Office  [phone]  CC: Primary care physician; Freddy Finner, NP   Note: This dictation was prepared with Dragon dictation along with smaller phrase technology. Any transcriptional errors that result from this process are unintentional.

## 2017-10-06 NOTE — Evaluation (Signed)
Physical Therapy Evaluation Patient Details Name: [name] MRN: [id number] DOB: 1961/01/15 Today's Date: 10/22/2017   History of Present Illness  57 y/o female s/p L great toe amputation 1/11.  Clinical Impression  Pt did well with limited ambulation using AD (safe with both walker and SPC) and post-op shoe.  She feels confident about being able to manage at home with the assist that she has.  Pt mostly uses her cane, but was a walker at home if needed.      Follow Up Recommendations DC plan and follow up therapy as arranged by surgeon    Equipment Recommendations       Recommendations for Other Services       Precautions / Restrictions Precautions Precautions: Fall Restrictions Weight Bearing Restrictions: Yes LLE Weight Bearing: Weight bearing as tolerated(with post-op shoe and AD)      Mobility  Bed Mobility Overal bed mobility: Modified Independent                Transfers Overall transfer level: Modified independent Equipment used: Rolling walker (2 wheeled)             General transfer comment: Pt able to rise with both walker and SPC with good confidence, no hesitation and generally showed good saferty  Ambulation/Gait Ambulation/Gait assistance: Supervision Ambulation Distance (Feet): 100 Feet Assistive device: Rolling walker (2 wheeled);Straight cane       General Gait Details: Pt walked ~50 ft with walker, ~50 ft with SPC.  She showed good safety, only minimal limp secondary to post-op shoe not allowing toe-off.  Stairs            Wheelchair Mobility    Modified Rankin (Stroke Patients Only)       Balance Overall balance assessment: Modified Independent                                           Pertinent Vitals/Pain Pain Assessment: 0-10 Pain Score: 5     Home Living Family/patient expects to be discharged to:: Private residence Living Arrangements: (sister) Available Help at Discharge: Family   Home  Access: Ramped entrance       Home Equipment: Environmental consultant - 2 wheels;Cane - single point      Prior Function Level of Independence: Independent               Hand Dominance        Extremity/Trunk Assessment   Upper Extremity Assessment Upper Extremity Assessment: Overall WFL for tasks assessed;Generalized weakness    Lower Extremity Assessment Lower Extremity Assessment: Overall WFL for tasks assessed;Generalized weakness       Communication   Communication: No difficulties  Cognition Arousal/Alertness: Awake/alert Behavior During Therapy: WFL for tasks assessed/performed Overall Cognitive Status: Within Functional Limits for tasks assessed                                        General Comments      Exercises     Assessment/Plan    PT Assessment Patient needs continued PT services  PT Problem List Decreased activity tolerance;Decreased strength;Decreased mobility;Decreased safety awareness;Decreased knowledge of use of DME       PT Treatment Interventions DME instruction;Gait training;Stair training;Functional mobility training;Therapeutic activities;Therapeutic exercise;Balance training;Neuromuscular re-education;Patient/family education    PT Goals (  Current goals can be found in the Care Plan section)  Acute Rehab PT Goals Patient Stated Goal: go home PT Goal Formulation: With patient Time For Goal Achievement: 10/13/17 Potential to Achieve Goals: Good    Frequency 7X/week   Barriers to discharge        Co-evaluation               AM-PAC PT "6 Clicks" Daily Activity  Outcome Measure Difficulty turning over in bed (including adjusting bedclothes, sheets and blankets)?: None Difficulty moving from lying on back to sitting on the side of the bed? : None Difficulty sitting down on and standing up from a chair with arms (e.g., wheelchair, bedside commode, etc,.)?: None Help needed moving to and from a bed to chair (including a  wheelchair)?: None Help needed walking in hospital room?: A Little Help needed climbing 3-5 steps with a railing? : A Little 6 Click Score: 22    End of Session Equipment Utilized During Treatment: Gait belt Activity Tolerance: Patient tolerated treatment well Patient left: with call bell/phone within reach;with bed alarm set Nurse Communication: Mobility status PT Visit Diagnosis: Other abnormalities of gait and mobility (R26.89)    Time: 8185-9093 PT Time Calculation (min) (ACUTE ONLY): 21 min   Charges:   PT Evaluation $PT Eval Low Complexity: 1 Low     PT G CodesKreg Shropshire, DPT 10/20/2017, 5:07 PM

## 2017-10-06 NOTE — Progress Notes (Signed)
Per RN case manager patient did well with PT and recommendation is home health. Per RN case manager patient will D/C home today. Please reconsult if future social work needs arise. CSW signing off.   [employer], LCSW [phone]

## 2017-10-07 ENCOUNTER — Emergency Department
Admission: EM | Admit: 2017-10-07 | Discharge: 2017-10-07 | Disposition: A | Discharge status: Home / Self Care | Payer: Medicare Other | Attending: Emergency Medicine | Admitting: Emergency Medicine

## 2017-10-07 ENCOUNTER — Encounter: Payer: Self-pay | Admitting: Emergency Medicine

## 2017-10-07 DIAGNOSIS — R112 Nausea with vomiting, unspecified: Secondary | ICD-10-CM | POA: Diagnosis present

## 2017-10-07 DIAGNOSIS — J449 Chronic obstructive pulmonary disease, unspecified: Secondary | ICD-10-CM | POA: Diagnosis not present

## 2017-10-07 DIAGNOSIS — I5032 Chronic diastolic (congestive) heart failure: Secondary | ICD-10-CM | POA: Insufficient documentation

## 2017-10-07 DIAGNOSIS — Z87891 Personal history of nicotine dependence: Secondary | ICD-10-CM | POA: Insufficient documentation

## 2017-10-07 DIAGNOSIS — Z79899 Other long term (current) drug therapy: Secondary | ICD-10-CM | POA: Diagnosis not present

## 2017-10-07 DIAGNOSIS — E1122 Type 2 diabetes mellitus with diabetic chronic kidney disease: Secondary | ICD-10-CM | POA: Diagnosis not present

## 2017-10-07 DIAGNOSIS — N186 End stage renal disease: Secondary | ICD-10-CM | POA: Insufficient documentation

## 2017-10-07 DIAGNOSIS — Z794 Long term (current) use of insulin: Secondary | ICD-10-CM | POA: Insufficient documentation

## 2017-10-07 DIAGNOSIS — Z992 Dependence on renal dialysis: Secondary | ICD-10-CM | POA: Insufficient documentation

## 2017-10-07 DIAGNOSIS — I132 Hypertensive heart and chronic kidney disease with heart failure and with stage 5 chronic kidney disease, or end stage renal disease: Secondary | ICD-10-CM | POA: Diagnosis not present

## 2017-10-07 DIAGNOSIS — G8918 Other acute postprocedural pain: Secondary | ICD-10-CM | POA: Insufficient documentation

## 2017-10-07 DIAGNOSIS — Z7982 Long term (current) use of aspirin: Secondary | ICD-10-CM | POA: Insufficient documentation

## 2017-10-07 DIAGNOSIS — I2510 Atherosclerotic heart disease of native coronary artery without angina pectoris: Secondary | ICD-10-CM | POA: Insufficient documentation

## 2017-10-07 DIAGNOSIS — J45909 Unspecified asthma, uncomplicated: Secondary | ICD-10-CM | POA: Insufficient documentation

## 2017-10-07 LAB — COMPREHENSIVE METABOLIC PANEL
ALT: 21 U/L (ref 14–54)
AST: 26 U/L (ref 15–41)
Albumin: 3.4 g/dL — ABNORMAL LOW (ref 3.5–5.0)
Alkaline Phosphatase: 121 U/L (ref 38–126)
Anion gap: 15 (ref 5–15)
BUN: 21 mg/dL — ABNORMAL HIGH (ref 6–20)
CO2: 27 mmol/L (ref 22–32)
Calcium: 8.7 mg/dL — ABNORMAL LOW (ref 8.9–10.3)
Chloride: 92 mmol/L — ABNORMAL LOW (ref 101–111)
Creatinine, Ser: 6.63 mg/dL — ABNORMAL HIGH (ref 0.44–1.00)
GFR calc Af Amer: 7 mL/min — ABNORMAL LOW (ref >60)
GFR calc non Af Amer: 6 mL/min — ABNORMAL LOW (ref >60)
Glucose, Bld: 83 mg/dL (ref 65–99)
Potassium: 4.2 mmol/L (ref 3.5–5.1)
Sodium: 134 mmol/L — ABNORMAL LOW (ref 135–145)
Total Bilirubin: 1.4 mg/dL — ABNORMAL HIGH (ref 0.3–1.2)
Total Protein: 7.4 g/dL (ref 6.5–8.1)

## 2017-10-07 LAB — CBC
HCT: 29.6 % — ABNORMAL LOW (ref 35.0–47.0)
Hemoglobin: 9.7 g/dL — ABNORMAL LOW (ref 12.0–16.0)
MCH: 30.1 pg (ref 26.0–34.0)
MCHC: 32.7 g/dL (ref 32.0–36.0)
MCV: 92.1 fL (ref 80.0–100.0)
Platelets: 196 10*3/uL (ref 150–440)
RBC: 3.22 MIL/uL — ABNORMAL LOW (ref 3.80–5.20)
RDW: 15.4 % — ABNORMAL HIGH (ref 11.5–14.5)
WBC: 6.3 10*3/uL (ref 3.6–11.0)

## 2017-10-07 LAB — AEROBIC/ANAEROBIC CULTURE W GRAM STAIN (SURGICAL/DEEP WOUND)

## 2017-10-07 LAB — LACTIC ACID, PLASMA: Lactic Acid, Venous: 1.1 mmol/L (ref 0.5–1.9)

## 2017-10-07 LAB — LIPASE, BLOOD: Lipase: 19 U/L (ref 11–51)

## 2017-10-07 MED ORDER — PROMETHAZINE HCL 25 MG/ML IJ SOLN
25.0000 mg | Freq: Once | INTRAMUSCULAR | Status: AC
  Administered 2017-10-07: 25 mg via INTRAVENOUS
  Filled 2017-10-07: qty 1

## 2017-10-07 MED ORDER — ACETAMINOPHEN 500 MG PO TABS
1000.0000 mg | ORAL_TABLET | Freq: Once | ORAL | Status: AC
  Administered 2017-10-07: 1000 mg via ORAL
  Filled 2017-10-07: qty 2

## 2017-10-07 MED ORDER — MORPHINE SULFATE (PF) 4 MG/ML IV SOLN
4.0000 mg | Freq: Once | INTRAVENOUS | Status: AC
  Administered 2017-10-07: 4 mg via INTRAVENOUS
  Filled 2017-10-07: qty 1

## 2017-10-07 MED ORDER — VANCOMYCIN HCL IN DEXTROSE 1-5 GM/200ML-% IV SOLN
1000.0000 mg | Freq: Once | INTRAVENOUS | Status: AC
  Administered 2017-10-07: 1000 mg via INTRAVENOUS
  Filled 2017-10-07: qty 200

## 2017-10-07 MED ORDER — PROMETHAZINE HCL 25 MG/ML IJ SOLN
12.5000 mg | Freq: Once | INTRAMUSCULAR | Status: AC
  Administered 2017-10-07: 12.5 mg via INTRAVENOUS
  Filled 2017-10-07: qty 1

## 2017-10-07 MED ORDER — SODIUM CHLORIDE 0.9 % IV BOLUS (SEPSIS): 1000.0000 mL | Freq: Once | INTRAVENOUS | Status: DC

## 2017-10-07 MED ORDER — OXYCODONE HCL 5 MG PO TABS
5.0000 mg | ORAL_TABLET | Freq: Once | ORAL | Status: AC
  Administered 2017-10-07: 5 mg via ORAL
  Filled 2017-10-07: qty 1

## 2017-10-07 NOTE — ED Triage Notes (Signed)
Pt in via [facility] from home.  Pt with partial toe amputation on Friday.  Pt reports N/V since being discharged home yesterday, onset of RUQ abdominal pain this morning.  Pt reports surgical pain as well to left foot; states she has not been able to keep pain medication down.  Vitals WDL.

## 2017-10-07 NOTE — ED Provider Notes (Signed)
[facility] Emergency Department Provider Note  ____________________________________________  Time seen: Approximately 9:49 AM  I have reviewed the triage vital signs and the nursing notes.   HISTORY  Chief Complaint Abdominal Pain and Phantom Pain   HPI [name] is a 57 y.o. female with h/o ESRD on HD (MWF), DM, CAD, recently amputated L big toe who presents for toe pain, nausea, and vomiting. Patient was sent home yesterday after being admitted for a necrotic left toe which resulted on an amputation. Patient reports that she has been vomiting for the last 4 days and was discharged home even though she was vomiting yesterday. She has not been able to keep pain medication down since she went home yesterday evening. She initially had diarrhea 4 days ago but that has resolved. She is complaining of severe pain located in her left toe that is constant, sharp, nonradiating. She reports that she has had more than 6 episodes of nonbloody nonbilious emesis and severe nausea. She is complaining of dull epigastric pain that has been constant for several days worse which improves when she vomits. Her last dialysis was 2 days ago. She was supposed to go to dialysis today but called her nephrologist Dr. Juleen China who recommended that patient came to the ED instead for nausea/ vomiting/ pain control.   Past Medical History:  Diagnosis Date  . Anemia   . Anginal pain ([place])   . Anxiety   . Arthritis   . Asthma   . Broken wrist   . Bronchitis   . chronic diastolic CHF 9/76/7341  . COPD (chronic obstructive pulmonary disease) ([place])   . Coronary artery disease    a. cath 2013: stenting to RCA (report not available); b. cath 2014: LM nl, pLAD 40%, mLAD nl, ost LCx 40%, mid LCx nl, pRCA 30% @ site of prior stent, mRCA 50%  . Depression   . Diabetes mellitus without complication ([place])   . Diabetic neuropathy ([place])   . dialysis 2006  . Diverticulosis   . Dizziness   .  Dyspnea   . Elevated lipids   . Environmental and seasonal allergies   . ESRD (end stage renal disease) on dialysis ([place])    M-W-F  . GERD (gastroesophageal reflux disease)   . Headache   . History of hiatal hernia   . HOH (hard of hearing)   . Hx of pancreatitis 2015  . Hypertension   . Lower extremity edema   . Mitral regurgitation    a. echo 10/2013: EF 62%, noWMA, mildly dilated LA, mild to mod MR/TR, GR1DD  . Myocardial infarction ([place])   . Orthopnea   . Pneumonia   . Renal cancer ([place])   . Renal insufficiency    Pt is on dialysis on M,W + F.  . Wheezing     Patient Active Problem List   Diagnosis Date Noted  . Osteomyelitis ([place]) 09/30/2017  . Carotid stenosis 06/18/2017  . Shortness of breath 05/04/2017  . Cellulitis of lower extremity 07/29/2016  . Chronic venous insufficiency 07/29/2016  . Lymphedema 07/29/2016  . TIA (transient ischemic attack) 04/21/2016  . Altered mental status 04/08/2016  . Hyperammonemia ([place]) 04/08/2016  . Elevated troponin 04/08/2016  . Depression 04/08/2016  . Depression, major, recurrent, severe with psychosis ([place]) 04/08/2016  . Blood in stool   . Intractable cyclical vomiting with nausea   . Reflux esophagitis   . Gastritis   . Generalized abdominal pain   . Uncontrollable vomiting   .  Major depressive disorder, recurrent episode, moderate ([place]) 03/15/2016  . Adjustment disorder with mixed anxiety and depressed mood 03/15/2016  . Malnutrition of moderate degree 12/01/2015  . Renal mass   . Dyspnea   . Acute renal failure ([place])   . Respiratory failure ([place])   . High temperature 11/14/2015  . Pulmonary edema   . Encounter for central line placement   . Encounter for orogastric (OG) tube placement   . Nausea 11/12/2015  . Hyperkalemia 10/03/2015  . Diarrhea, unspecified 07/22/2015  . Pneumonia 05/21/2015  . Hypoglycemia 04/24/2015  . Unresponsiveness 04/24/2015  . Bradycardia 04/24/2015  . Hypothermia 04/24/2015  . Acute  respiratory failure ([place]) 04/24/2015  . Acute diastolic CHF (congestive heart failure) ([place]) 04/05/2015  . Diabetic gastroparesis ([place]) 04/05/2015  . Hypokalemia 04/05/2015  . Generalized weakness 04/05/2015  . Acute pulmonary edema ([place]) 04/03/2015  . Nausea and vomiting 04/03/2015  . Hypoglycemia associated with diabetes ([place]) 04/03/2015  . Anemia of chronic disease 04/03/2015  . Secondary hyperparathyroidism ([place]) 04/03/2015  . Pressure ulcer 04/02/2015  . Acute respiratory failure with hypoxia ([place]) 04/01/2015  . Adjustment disorder with anxiety 03/14/2015  . Somatic symptom disorder, mild 03/08/2015  . Coronary artery disease involving native coronary artery of native heart without angina pectoris   . Nausea & vomiting 03/06/2015  . Abdominal pain 03/06/2015  . DM (diabetes mellitus) ([place]) 03/06/2015  . HTN (hypertension) 03/06/2015  . Gastroparesis 02/24/2015  . Pleural effusion 02/19/2015  . HCAP (healthcare-associated pneumonia) 02/19/2015  . End-stage renal disease on hemodialysis ([place]) 02/19/2015    Past Surgical History:  Procedure Laterality Date  . ABDOMINAL HYSTERECTOMY  1992  . AMPUTATION TOE Left 10/02/2017   Procedure: AMPUTATION TOE-LEFT GREAT TOE;  Surgeon: Albertine Patricia, DPM;  Location: [facility] ORS;  Service: Podiatry;  Laterality: Left;  . APPENDECTOMY    . CARDIAC CATHETERIZATION Left 07/26/2015   Procedure: Left Heart Cath and Coronary Angiography;  Surgeon: Dionisio David, MD;  Location: [place] [facility];  Service: Cardiovascular;  Laterality: Left;  . CATARACT EXTRACTION W/ INTRAOCULAR LENS IMPLANT Right   . CATARACT EXTRACTION W/PHACO Left 03/10/2017   Procedure: CATARACT EXTRACTION PHACO AND INTRAOCULAR LENS PLACEMENT (IOC);  Surgeon: Birder Robson, MD;  Location: [facility] ORS;  Service: Ophthalmology;  Laterality: Left;  [place] 00:51.9 AP% 14.2 CDE 7.39 fluid pack lot # [id number]  . CHOLECYSTECTOMY    . COLONOSCOPY WITH PROPOFOL N/A 08/12/2016    Procedure: COLONOSCOPY WITH PROPOFOL;  Surgeon: Lollie Sails, MD;  Location: [facility] ENDOSCOPY;  Service: Endoscopy;  Laterality: N/A;  . DIALYSIS FISTULA CREATION Left    upper arm  . dialysis grafts    . ESOPHAGOGASTRODUODENOSCOPY N/A 03/08/2015   Procedure: ESOPHAGOGASTRODUODENOSCOPY (EGD);  Surgeon: Manya Silvas, MD;  Location: [facility] ENDOSCOPY;  Service: Endoscopy;  Laterality: N/A;  . ESOPHAGOGASTRODUODENOSCOPY (EGD) WITH PROPOFOL N/A 03/18/2016   Procedure: ESOPHAGOGASTRODUODENOSCOPY (EGD) WITH PROPOFOL;  Surgeon: [name], MD;  Location: [facility] ENDOSCOPY;  Service: Endoscopy;  Laterality: N/A;  . EYE SURGERY Right 2018  . FECAL TRANSPLANT N/A 08/23/2015   Procedure: FECAL TRANSPLANT;  Surgeon: Manya Silvas, MD;  Location: [facility] ENDOSCOPY;  Service: Endoscopy;  Laterality: N/A;  . HAND SURGERY Bilateral   . PERIPHERAL VASCULAR CATHETERIZATION N/A 12/20/2015   Procedure: Thrombectomy of dialysis access versus permcath placement;  Surgeon: Algernon Huxley, MD;  Location: [place] [facility];  Service: Cardiovascular;  Laterality: N/A;  . PERIPHERAL VASCULAR CATHETERIZATION N/A 12/20/2015   Procedure: A/V Shunt Intervention;  Surgeon:  Algernon Huxley, MD;  Location: [place] [facility];  Service: Cardiovascular;  Laterality: N/A;  . PERIPHERAL VASCULAR CATHETERIZATION N/A 12/20/2015   Procedure: A/V Shuntogram/Fistulagram;  Surgeon: Algernon Huxley, MD;  Location: [place] [facility];  Service: Cardiovascular;  Laterality: N/A;  . PERIPHERAL VASCULAR CATHETERIZATION N/A 01/02/2016   Procedure: A/V Shuntogram/Fistulagram;  Surgeon: Algernon Huxley, MD;  Location: [place] CV LAB;  Service: Cardiovascular;  Laterality: N/A;  . PERIPHERAL VASCULAR CATHETERIZATION N/A 01/02/2016   Procedure: A/V Shunt Intervention;  Surgeon: Algernon Huxley, MD;  Location: [place] [facility];  Service: Cardiovascular;  Laterality: N/A;    Prior to Admission medications   Medication Sig Start Date End Date Taking?  Authorizing Provider  acidophilus (RISAQUAD) CAPS capsule Take 1 capsule by mouth daily.   Yes [provider]  amLODipine (NORVASC) 10 MG tablet Take 10 mg by mouth daily.   Yes [provider]  aspirin EC 81 MG tablet Take 81 mg by mouth daily. 09/08/16  Yes [provider]  atorvastatin (LIPITOR) 20 MG tablet Take 20 mg by mouth at bedtime.  12/02/16  Yes [provider]  cetirizine (ZYRTEC) 10 MG tablet Take 10 mg by mouth daily.    Yes [provider]  cholecalciferol (VITAMIN D) 400 units TABS tablet Take 400 Units by mouth daily.   Yes [provider]  clopidogrel (PLAVIX) 75 MG tablet Take 1 tablet (75 mg total) by mouth daily. 03/25/17 03/25/18 Yes Dustin Flock, MD  cyanocobalamin 1000 MCG tablet Take 1,000 mcg by mouth daily with lunch.    Yes [provider]  dexlansoprazole (DEXILANT) 60 MG capsule Take 60 mg by mouth daily.   Yes [provider]  FLUoxetine (PROZAC) 20 MG capsule Take 1 capsule (20 mg total) by mouth daily. Patient taking differently: Take 20 mg by mouth at bedtime.  04/10/16  Yes Hower, Aaron Mose, MD  insulin detemir (LEVEMIR) 100 UNIT/ML injection Inject 0.02 mLs (2 Units total) into the skin 2 (two) times daily. Patient taking differently: Inject 4 Units into the skin at bedtime.  09/05/17  Yes Epifanio Lesches, MD  lipase/protease/amylase (CREON) 12000 UNITS CPEP capsule Take 24,000-36,000 Units by mouth 3 (three) times daily with meals. Take 36000 mg in the morning, 24000 midday & 36000 in the evening   Yes [provider]  multivitamin (RENA-VIT) TABS tablet Take 1 tablet by mouth daily.    Yes [provider]  OLANZapine (ZYPREXA) 5 MG tablet Take 5 mg by mouth at bedtime.   Yes [provider]  omega-3 acid ethyl esters (LOVAZA) 1 g capsule Take 1 g by mouth daily.    Yes [provider]  ranitidine (ZANTAC) 300 MG tablet Take 300 mg by mouth at bedtime.  In the afternoon   Yes [provider]  rifaximin (XIFAXAN) 550 MG TABS tablet Take 550 mg by mouth 2 (two) times daily.   Yes [provider]  sevelamer carbonate (RENVELA) 800 MG tablet Take 800 mg by mouth 3 (three) times daily with meals.   Yes [provider]  albuterol (PROVENTIL HFA;VENTOLIN HFA) 108 (90 Base) MCG/ACT inhaler Inhale 2 puffs into the lungs every 6 (six) hours as needed for shortness of breath.     [provider]  albuterol (PROVENTIL) (2.5 MG/3ML) 0.083% nebulizer solution Take 2.5 mg by nebulization every 6 (six) hours as needed for wheezing or shortness of breath.    [provider]  alum &  mag hydroxide-simeth (MAALOX MAX) 400-400-40 MG/5ML suspension Take 5 mLs by mouth every 6 (six) hours as needed for indigestion. 06/09/17   Rudene Re, MD  diphenhydrAMINE (BENADRYL) 25 mg capsule Take 25 mg by mouth every 6 (six) hours as needed for itching.    [provider]  doxycycline (VIBRA-TABS) 100 MG tablet Take 1 tablet (100 mg total) by mouth every 12 (twelve) hours. Patient not taking: Reported on 10/07/2017 09/23/2017   Salary, Holly Bodily D, MD  famotidine (PEPCID) 20 MG tablet Take 1 tablet (20 mg total) by mouth 2 (two) times daily. Patient not taking: Reported on 09/30/2017 06/09/17 06/09/18  Rudene Re, MD  glucose 4 GM chewable tablet Chew 3-4 tablets by mouth as needed for low blood sugar (blood sugar less than 60).     [provider]  HYDROcodone-acetaminophen (NORCO/VICODIN) 5-325 MG tablet Take 1 tablet by mouth every 6 (six) hours as needed for moderate pain or severe pain. Patient taking differently: Take 1 tablet by mouth 3 (three) times daily as needed for moderate pain or severe pain.  05/05/17   Nicholes Mango, MD  meclizine (ANTIVERT) 12.5 MG tablet Take 1 tablet by mouth three times daily as needed    [provider]  metoCLOPramide (REGLAN) 10 MG tablet Take 1 tablet (10 mg total) by  mouth every 8 (eight) hours as needed for nausea or vomiting. Patient not taking: Reported on 10/07/2017 06/08/17   Earleen Newport, MD  naloxone [facility]) 0.4 MG/ML injection Inject 0.4 mg into the vein as needed.    [provider]  nitroGLYCERIN (NITROSTAT) 0.4 MG SL tablet Place 0.4 mg under the tongue every 5 (five) minutes x 3 doses as needed.    [provider]  promethazine (PHENERGAN) 12.5 MG tablet Take 1 tablet (12.5 mg total) by mouth every 6 (six) hours as needed for nausea or vomiting. 10/13/2017   Salary, Avel Peace, MD    Allergies Ace inhibitors; Ativan [lorazepam]; Compazine [prochlorperazine edisylate]; Sumatriptan succinate; Dilaudid [hydromorphone hcl]; Ondansetron; Zofran [ondansetron hcl]; Codeine; Gabapentin; [place]; Losartan; Oxycodone; Prochlorperazine; Reglan [metoclopramide]; Scopolamine; Tape; and Tapentadol  Family History  Problem Relation Age of Onset  . Kidney disease Mother   . Diabetes Mother   . Cancer Father   . Kidney disease Sister     Social History Social History   Tobacco Use  . Smoking status: Former Smoker    Packs/day: 0.50    Years: 40.00    Pack years: 20.00    Types: Cigarettes    Last attempt to quit: 02/13/2015    Years since quitting: 2.6  . Smokeless tobacco: Never Used  Substance Use Topics  . Alcohol use: Yes    Alcohol/week: 0.6 oz    Types: 1 Glasses of wine per week    Comment: glass wine week per pt  . Drug use: Yes    Types: Marijuana    Comment: once a day    Review of Systems  Constitutional: Negative for fever. Eyes: Negative for visual changes. ENT: Negative for sore throat. Neck: No neck pain  Cardiovascular: Negative for chest pain. Respiratory: Negative for shortness of breath. Gastrointestinal: + abdominal pain, N/V Genitourinary: Negative for dysuria. Musculoskeletal: Negative for back pain. + L toe pain Skin: Negative for rash. Neurological: Negative for headaches, weakness or  numbness. Psych: No SI or HI  ____________________________________________   PHYSICAL EXAM:  VITAL SIGNS: ED Triage Vitals  Enc Vitals Group     BP 10/07/17 0925 [place] Kitchen)  174/67     Pulse Rate 10/07/17 0925 78     Resp 10/07/17 0925 18     Temp 10/07/17 0925 98.3 F (36.8 C)     Temp Source 10/07/17 0925 Oral     SpO2 10/07/17 0925 100 %     Weight 10/07/17 0926 184 lb (83.5 kg)     Height 10/07/17 0926 5\' 5"  (1.651 m)     Head Circumference --      Peak Flow --      Pain Score 10/07/17 0925 10     Pain Loc --      Pain Edu? --      Excl. in [place]? --     Constitutional: Alert and oriented, crying.  HEENT:      Head: Normocephalic and atraumatic.         Eyes: Conjunctivae are normal. Sclera is non-icteric.       Mouth/Throat: Mucous membranes are moist.       Neck: Supple with no signs of meningismus. Cardiovascular: Regular rate and rhythm. No murmurs, gallops, or rubs. 2+ symmetrical distal pulses are present in all extremities. No JVD. Respiratory: Normal respiratory effort. Lungs are clear to auscultation bilaterally. No wheezes, crackles, or rhonchi.  Gastrointestinal: Soft, non tender, and non distended with positive bowel sounds. No rebound or guarding. Musculoskeletal: L toe is partially amputated, stitches in place, no crepitus, toe is swollen, erythematous and warm to the touch. Dry with no purulent discharge Neurologic: Normal speech and language. Face is symmetric. Moving all extremities. No gross focal neurologic deficits are appreciated. Skin: Skin is warm, dry and intact. No rash noted. Psychiatric: Mood and affect are normal. Speech and behavior are normal.  ____________________________________________   LABS (all labs ordered are listed, but only abnormal results are displayed)  Labs Reviewed  COMPREHENSIVE METABOLIC PANEL - Abnormal; Notable for the following components:      Result Value   Sodium 134 (*)    Chloride 92 (*)    BUN 21 (*)    Creatinine,  Ser 6.63 (*)    Calcium 8.7 (*)    Albumin 3.4 (*)    Total Bilirubin 1.4 (*)    GFR calc non Af Amer 6 (*)    GFR calc Af Amer 7 (*)    All other components within normal limits  CBC - Abnormal; Notable for the following components:   RBC 3.22 (*)    Hemoglobin 9.7 (*)    HCT 29.6 (*)    RDW 15.4 (*)    All other components within normal limits  CULTURE, BLOOD (ROUTINE X 2)  CULTURE, BLOOD (ROUTINE X 2)  LIPASE, BLOOD  LACTIC ACID, PLASMA  URINALYSIS, COMPLETE (UACMP) WITH MICROSCOPIC   ____________________________________________  EKG  ED ECG REPORT I, Rudene Re, the attending physician, personally viewed and interpreted this ECG.  Normal sinus rhythm, rate of 79, prolonged QTC, normal axis, no ST elevations or depressions. No significant changes when compared to prior ____________________________________________  RADIOLOGY  none ____________________________________________   PROCEDURES  Procedure(s) performed: None Procedures Critical Care performed:  None ____________________________________________   INITIAL IMPRESSION / ASSESSMENT AND PLAN / ED COURSE  57 y.o. female with h/o ESRD on HD (MWF), DM, CAD, recently amputated L big toe who presents for toe pain, nausea, and vomiting. Patient seems uncomfortable, surgical bed at stitches in place, dry with no purulent discharge, no crepitus, told does look swollen, warm, erythematous. Erythema is confined to the toe, possibly normal post-op course  vs early infection. Patient is due for HD today. Unable to keep pain meds down. Will place IV, give her Zofran and morphine. Will check labs, including lactic, and blood culture to eval for signs of sepsis, DKA, or need for emergent dialysis. EKG shows no evidence of hyperkalemia. Will consult Dr. Elvina Mattes to evaluate the toe.  Clinical Course as of Oct 08 1451  Wed Oct 07, 2017  1351 Discussed patient with Dr. Candiss Norse, nephrology who recommended outpatient dialysis  tomorrow. Patient will call the dialysis center now to schedule an appointment. Discussed with Dr. Elvina Mattes who will come to the ED to evaluate the surgical site.  Patient's nausea has resolved. Will start PO pain meds. Dispo per Dr. Elvina Mattes. Labs with no evidence of DKA or sepsis. No indication for emergent dialysis at this time.  [CV]  55 Dr. Elvina Mattes evaluated patient and reports that surgical site looks well. No indication for admission from his perspective. He asked me to give patient a dose of IV vancomycin prior to discharge since patient has not taken her abx today due to nausea. That has been ordered. I will contact the dialysis center for an appointment tomorrow for her to be dialyzed. Plan to dc home.  [CV]  1950 Patient has appointment tomorrow at [facility] for HD.  [CV]    Clinical Course User Index [CV] Alfred Levins [place], MD     As part of my medical decision making, I reviewed the following data within the [place] notes reviewed and incorporated, Labs reviewed , EKG interpreted , A consult was requested and obtained from this/these consultant(s) neprhology and podiatry, Notes from prior ED visits and [place] Controlled Substance Database    Pertinent labs & imaging results that were available during my care of the patient were reviewed by me and considered in my medical decision making (see chart for details).    ____________________________________________   FINAL CLINICAL IMPRESSION(S) / ED DIAGNOSES  Final diagnoses:  Non-intractable vomiting with nausea, unspecified vomiting type  Post-op pain      NEW MEDICATIONS STARTED DURING THIS VISIT:  ED Discharge Orders    None       Note:  This document was prepared using Dragon voice recognition software and may include unintentional dictation errors.    Rudene Re, MD 10/07/17 [phone]

## 2017-10-07 NOTE — ED Notes (Signed)
Patient verbalizes understanding of d/c instructions, medications, and follow-up. Vital signs stable and pain controlled per pt. Patient In Not in Acute Distress at time of discharge and denies further concerns regarding this visit. Patient stable at the time of departure from the unit, departing unit by the safest and most appropriate manner per patient condition and limitations with all belongings accounted for. Patient advised to return to the ED at any time for emergent concerns, or for new/worsening symptoms.   

## 2017-10-07 NOTE — Discharge Instructions (Signed)
Take phenergan, percocet, and doxycycline as prescribed by your surgeon. Go to dialysis tomorrow. Follow up with Dr. Elvina Mattes in 3 days. Return to the ER for worsening pain, inability to keep your medications down, fever, if you are unable to undergo dialysis tomorrow, or any new symptoms concerning to you.

## 2017-10-08 LAB — SURGICAL PATHOLOGY

## 2017-10-09 ENCOUNTER — Ambulatory Visit
Admission: EM | Admit: 2017-10-09 | Discharge: 2017-10-09 | Disposition: A | Discharge status: Home / Self Care | Payer: Medicare Other | Attending: Emergency Medicine | Admitting: Emergency Medicine

## 2017-10-09 DIAGNOSIS — K3184 Gastroparesis: Secondary | ICD-10-CM | POA: Diagnosis not present

## 2017-10-09 DIAGNOSIS — Z8673 Personal history of transient ischemic attack (TIA), and cerebral infarction without residual deficits: Secondary | ICD-10-CM | POA: Insufficient documentation

## 2017-10-09 DIAGNOSIS — Z992 Dependence on renal dialysis: Secondary | ICD-10-CM | POA: Diagnosis not present

## 2017-10-09 DIAGNOSIS — I872 Venous insufficiency (chronic) (peripheral): Secondary | ICD-10-CM | POA: Diagnosis not present

## 2017-10-09 DIAGNOSIS — Z79899 Other long term (current) drug therapy: Secondary | ICD-10-CM | POA: Insufficient documentation

## 2017-10-09 DIAGNOSIS — I2510 Atherosclerotic heart disease of native coronary artery without angina pectoris: Secondary | ICD-10-CM | POA: Diagnosis not present

## 2017-10-09 DIAGNOSIS — I5032 Chronic diastolic (congestive) heart failure: Secondary | ICD-10-CM | POA: Insufficient documentation

## 2017-10-09 DIAGNOSIS — F331 Major depressive disorder, recurrent, moderate: Secondary | ICD-10-CM | POA: Insufficient documentation

## 2017-10-09 DIAGNOSIS — I132 Hypertensive heart and chronic kidney disease with heart failure and with stage 5 chronic kidney disease, or end stage renal disease: Secondary | ICD-10-CM | POA: Diagnosis not present

## 2017-10-09 DIAGNOSIS — E785 Hyperlipidemia, unspecified: Secondary | ICD-10-CM | POA: Insufficient documentation

## 2017-10-09 DIAGNOSIS — K210 Gastro-esophageal reflux disease with esophagitis: Secondary | ICD-10-CM | POA: Insufficient documentation

## 2017-10-09 DIAGNOSIS — E875 Hyperkalemia: Secondary | ICD-10-CM | POA: Diagnosis not present

## 2017-10-09 DIAGNOSIS — Z89412 Acquired absence of left great toe: Secondary | ICD-10-CM | POA: Insufficient documentation

## 2017-10-09 DIAGNOSIS — N186 End stage renal disease: Secondary | ICD-10-CM | POA: Diagnosis not present

## 2017-10-09 DIAGNOSIS — J449 Chronic obstructive pulmonary disease, unspecified: Secondary | ICD-10-CM | POA: Insufficient documentation

## 2017-10-09 DIAGNOSIS — Z85528 Personal history of other malignant neoplasm of kidney: Secondary | ICD-10-CM | POA: Insufficient documentation

## 2017-10-09 DIAGNOSIS — N2581 Secondary hyperparathyroidism of renal origin: Secondary | ICD-10-CM | POA: Diagnosis not present

## 2017-10-09 DIAGNOSIS — E11319 Type 2 diabetes mellitus with unspecified diabetic retinopathy without macular edema: Secondary | ICD-10-CM | POA: Insufficient documentation

## 2017-10-09 DIAGNOSIS — E1122 Type 2 diabetes mellitus with diabetic chronic kidney disease: Secondary | ICD-10-CM | POA: Insufficient documentation

## 2017-10-09 DIAGNOSIS — Z87891 Personal history of nicotine dependence: Secondary | ICD-10-CM | POA: Insufficient documentation

## 2017-10-09 DIAGNOSIS — F4323 Adjustment disorder with mixed anxiety and depressed mood: Secondary | ICD-10-CM | POA: Diagnosis not present

## 2017-10-09 DIAGNOSIS — D631 Anemia in chronic kidney disease: Secondary | ICD-10-CM | POA: Insufficient documentation

## 2017-10-09 DIAGNOSIS — I252 Old myocardial infarction: Secondary | ICD-10-CM | POA: Insufficient documentation

## 2017-10-09 DIAGNOSIS — Z9841 Cataract extraction status, right eye: Secondary | ICD-10-CM | POA: Insufficient documentation

## 2017-10-09 DIAGNOSIS — E1143 Type 2 diabetes mellitus with diabetic autonomic (poly)neuropathy: Secondary | ICD-10-CM | POA: Insufficient documentation

## 2017-10-09 DIAGNOSIS — Z9842 Cataract extraction status, left eye: Secondary | ICD-10-CM | POA: Insufficient documentation

## 2017-10-09 DIAGNOSIS — I340 Nonrheumatic mitral (valve) insufficiency: Secondary | ICD-10-CM | POA: Insufficient documentation

## 2017-10-09 DIAGNOSIS — R112 Nausea with vomiting, unspecified: Secondary | ICD-10-CM | POA: Diagnosis present

## 2017-10-09 DIAGNOSIS — K449 Diaphragmatic hernia without obstruction or gangrene: Secondary | ICD-10-CM | POA: Insufficient documentation

## 2017-10-09 DIAGNOSIS — Z7982 Long term (current) use of aspirin: Secondary | ICD-10-CM | POA: Insufficient documentation

## 2017-10-09 DIAGNOSIS — I6529 Occlusion and stenosis of unspecified carotid artery: Secondary | ICD-10-CM | POA: Insufficient documentation

## 2017-10-09 DIAGNOSIS — Z91048 Other nonmedicinal substance allergy status: Secondary | ICD-10-CM | POA: Insufficient documentation

## 2017-10-09 DIAGNOSIS — Z90710 Acquired absence of both cervix and uterus: Secondary | ICD-10-CM | POA: Insufficient documentation

## 2017-10-09 DIAGNOSIS — Z888 Allergy status to other drugs, medicaments and biological substances status: Secondary | ICD-10-CM | POA: Insufficient documentation

## 2017-10-09 DIAGNOSIS — M79672 Pain in left foot: Secondary | ICD-10-CM

## 2017-10-09 DIAGNOSIS — Z961 Presence of intraocular lens: Secondary | ICD-10-CM | POA: Insufficient documentation

## 2017-10-09 DIAGNOSIS — Z794 Long term (current) use of insulin: Secondary | ICD-10-CM | POA: Insufficient documentation

## 2017-10-09 DIAGNOSIS — Z7902 Long term (current) use of antithrombotics/antiplatelets: Secondary | ICD-10-CM | POA: Insufficient documentation

## 2017-10-09 DIAGNOSIS — K2970 Gastritis, unspecified, without bleeding: Secondary | ICD-10-CM | POA: Insufficient documentation

## 2017-10-09 DIAGNOSIS — Z885 Allergy status to narcotic agent status: Secondary | ICD-10-CM | POA: Insufficient documentation

## 2017-10-09 DIAGNOSIS — Z8719 Personal history of other diseases of the digestive system: Secondary | ICD-10-CM | POA: Insufficient documentation

## 2017-10-09 DIAGNOSIS — Z9049 Acquired absence of other specified parts of digestive tract: Secondary | ICD-10-CM | POA: Insufficient documentation

## 2017-10-09 LAB — COMPREHENSIVE METABOLIC PANEL
ALT: 18 U/L (ref 14–54)
AST: 29 U/L (ref 15–41)
Albumin: 3.3 g/dL — ABNORMAL LOW (ref 3.5–5.0)
Alkaline Phosphatase: 107 U/L (ref 38–126)
Anion gap: 18 — ABNORMAL HIGH (ref 5–15)
BUN: 37 mg/dL — ABNORMAL HIGH (ref 6–20)
CO2: 23 mmol/L (ref 22–32)
Calcium: 8.1 mg/dL — ABNORMAL LOW (ref 8.9–10.3)
Chloride: 91 mmol/L — ABNORMAL LOW (ref 101–111)
Creatinine, Ser: 9.57 mg/dL — ABNORMAL HIGH (ref 0.44–1.00)
GFR calc Af Amer: 5 mL/min — ABNORMAL LOW (ref >60)
GFR calc non Af Amer: 4 mL/min — ABNORMAL LOW (ref >60)
Glucose, Bld: 52 mg/dL — ABNORMAL LOW (ref 65–99)
Potassium: 5.5 mmol/L — ABNORMAL HIGH (ref 3.5–5.1)
Sodium: 132 mmol/L — ABNORMAL LOW (ref 135–145)
Total Bilirubin: 1.6 mg/dL — ABNORMAL HIGH (ref 0.3–1.2)
Total Protein: 7.4 g/dL (ref 6.5–8.1)

## 2017-10-09 LAB — GLUCOSE, CAPILLARY
Glucose-Capillary: 41 mg/dL — CL (ref 65–99)
Glucose-Capillary: 39 mg/dL — CL (ref 65–99)
Glucose-Capillary: 119 mg/dL — ABNORMAL HIGH (ref 65–99)
Glucose-Capillary: 118 mg/dL — ABNORMAL HIGH (ref 65–99)
Glucose-Capillary: 92 mg/dL (ref 65–99)

## 2017-10-09 LAB — CBC WITH DIFFERENTIAL/PLATELET
Basophils Absolute: 0 10*3/uL (ref 0–0.1)
Basophils Relative: 1 %
Eosinophils Absolute: 0.6 10*3/uL (ref 0–0.7)
Eosinophils Relative: 9 %
HCT: 28.3 % — ABNORMAL LOW (ref 35.0–47.0)
Hemoglobin: 9.2 g/dL — ABNORMAL LOW (ref 12.0–16.0)
Lymphocytes Relative: 22 %
Lymphs Abs: 1.5 10*3/uL (ref 1.0–3.6)
MCH: 29.9 pg (ref 26.0–34.0)
MCHC: 32.4 g/dL (ref 32.0–36.0)
MCV: 92.3 fL (ref 80.0–100.0)
Monocytes Absolute: 0.9 10*3/uL (ref 0.2–0.9)
Monocytes Relative: 13 %
Neutro Abs: 3.8 10*3/uL (ref 1.4–6.5)
Neutrophils Relative %: 55 %
Platelets: 213 10*3/uL (ref 150–440)
RBC: 3.07 MIL/uL — ABNORMAL LOW (ref 3.80–5.20)
RDW: 16.4 % — ABNORMAL HIGH (ref 11.5–14.5)
WBC: 6.8 10*3/uL (ref 3.6–11.0)

## 2017-10-09 LAB — LIPASE, BLOOD: Lipase: 19 U/L (ref 11–51)

## 2017-10-09 MED ORDER — PROMETHAZINE HCL 25 MG RE SUPP: 25.0000 mg | Freq: Four times a day (QID) | RECTAL | 1 refills | Status: DC | PRN

## 2017-10-09 MED ORDER — PROMETHAZINE HCL 25 MG PO TABS: 12.5000 mg | ORAL_TABLET | Freq: Four times a day (QID) | ORAL | 0 refills | Status: DC | PRN

## 2017-10-09 MED ORDER — FENTANYL CITRATE (PF) 100 MCG/2ML IJ SOLN
100.0000 ug | Freq: Once | INTRAMUSCULAR | Status: AC
  Administered 2017-10-09: 100 ug via INTRAVENOUS
  Filled 2017-10-09: qty 2

## 2017-10-09 MED ORDER — DEXTROSE 10 % IV SOLN
INTRAVENOUS | Status: DC
  Administered 2017-10-09: 16:00:00 via INTRAVENOUS

## 2017-10-09 MED ORDER — DEXTROSE 50 % IV SOLN
INTRAVENOUS | Status: AC
  Administered 2017-10-09: 25 mL via INTRAVENOUS
  Filled 2017-10-09: qty 50

## 2017-10-09 MED ORDER — SODIUM CHLORIDE 0.9 % IV BOLUS (SEPSIS): 500.0000 mL | Freq: Once | INTRAVENOUS | Status: DC

## 2017-10-09 MED ORDER — DEXTROSE 50 % IV SOLN
1.0000 | Freq: Once | INTRAVENOUS | Status: AC
  Administered 2017-10-09: 25 mL via INTRAVENOUS

## 2017-10-09 MED ORDER — PROMETHAZINE HCL 25 MG/ML IJ SOLN
25.0000 mg | Freq: Once | INTRAMUSCULAR | Status: AC
  Administered 2017-10-09: 25 mg via INTRAVENOUS
  Filled 2017-10-09: qty 1

## 2017-10-09 NOTE — Progress Notes (Signed)
HD Started 

## 2017-10-09 NOTE — Progress Notes (Signed)
[facility], [place] 10/09/17  Subjective:   Patient returned to ER for foot pain, nausea and vomiting States she is not able to keep food down Blood sugar was low so placed on D10 but iv is infiltrated Latest blood sugar is 110 No fevers Found to have hyperkalemia ER is requesting dialysis  Objective:  Vital signs in last 24 hours:  Temp:  [97.7 F (36.5 C)] 97.7 F (36.5 C) (01/18 0928) Pulse Rate:  [72] 72 (01/18 1324) Resp:  [13-17] 14 (01/18 1645) BP: (163-192)/(54-70) 184/63 (01/18 1645) SpO2:  [100 %] 100 % (01/18 1324) Weight:  [83.5 kg (184 lb)] 83.5 kg (184 lb) (01/18 0928)  Weight change:  Filed Weights   10/09/17 0928  Weight: 83.5 kg (184 lb)    Intake/Output:   No intake or output data in the 24 hours ending 10/09/17 1704   Physical Exam: General: NAD, laying in bed  HEENT Moist oral mucus membranes  Neck supple  Pulm/lungs Normal effort, clear  CVS/Heart regular  Abdomen:  Soft,   Extremities: Trace edema, left foot bandaged  Neurologic: Alert, oriented, decreased hearing  Skin: No rashes  Access: AVF       Basic Metabolic Panel:  Recent Labs  Lab 10/07/17 1113 10/09/17 1143  NA 134* 132*  K 4.2 5.5*  CL 92* 91*  CO2 27 23  GLUCOSE 83 52*  BUN 21* 37*  CREATININE 6.63* 9.57*  CALCIUM 8.7* 8.1*     CBC: Recent Labs  Lab 10/04/17 0424 10/07/17 1113 10/09/17 1000  WBC 4.5 6.3 6.8  NEUTROABS  --   --  3.8  HGB 9.4* 9.7* 9.2*  HCT 29.3* 29.6* 28.3*  MCV 92.1 92.1 92.3  PLT 166 196 213      Lab Results  Component Value Date   HEPBSAG Negative 05/04/2017   HEPBIGM  06/12/2010    NEGATIVE (NOTE) High levels of Hepatitis B Core IgM antibody are detectable during the acute stage of Hepatitis B. This antibody is used to differentiate current from past HBV infection.       Microbiology:  Recent Results (from the past 240 hour(s))  Wound or Superficial Culture     Status: None   Collection  Time: 09/30/17  3:10 PM  Result Value Ref Range Status   Specimen Description   Final    TOE LEFT Performed at [facility], [address], [place], [place] [address]    Special Requests   Final    NONE Performed at [facility], [address] [place], [place] [address]    Gram Stain   Final    RARE WBC PRESENT, PREDOMINANTLY MONONUCLEAR RARE GRAM POSITIVE COCCI Performed at [facility], [place] [address], [place], [place] [address]    Culture FEW STAPHYLOCOCCUS AUREUS  Final   Report Status 10/03/2017 FINAL  Final   Organism ID, Bacteria STAPHYLOCOCCUS AUREUS  Final      Susceptibility   Staphylococcus aureus - MIC*    CIPROFLOXACIN <=0.5 SENSITIVE Sensitive     ERYTHROMYCIN >=8 RESISTANT Resistant     GENTAMICIN <=0.5 SENSITIVE Sensitive     OXACILLIN 0.5 SENSITIVE Sensitive     TETRACYCLINE <=1 SENSITIVE Sensitive     VANCOMYCIN 1 SENSITIVE Sensitive     TRIMETH/SULFA <=10 SENSITIVE Sensitive     CLINDAMYCIN <=0.25 SENSITIVE Sensitive     RIFAMPIN <=0.5 SENSITIVE Sensitive     Inducible Clindamycin NEGATIVE Sensitive     * FEW  STAPHYLOCOCCUS AUREUS  MRSA PCR Screening     Status: None   Collection Time: 09/30/17  7:55 PM  Result Value Ref Range Status   MRSA by PCR NEGATIVE NEGATIVE Final    Comment:        The GeneXpert MRSA Assay (FDA approved for NASAL specimens only), is one component of a comprehensive MRSA colonization surveillance program. It is not intended to diagnose MRSA infection nor to guide or monitor treatment for MRSA infections. Performed at [facility], [address], [place], [place]  [address]   Aerobic/Anaerobic Culture (surgical/deep wound)     Status: None   Collection Time: 10/02/17  8:02 AM  Result Value Ref Range Status   Specimen Description   Final    BONE Performed at [facility], [address], [place], [place] [address]    Special Requests   Final    GREAT TOE Performed at  [facility], [place], [place] [address]    Gram Stain   Final    RARE WBC PRESENT, PREDOMINANTLY MONONUCLEAR NO ORGANISMS SEEN    Culture   Final    FEW STAPHYLOCOCCUS AUREUS SUSCEPTIBILITIES PERFORMED ON PREVIOUS CULTURE WITHIN THE LAST 5 DAYS. FEW STAPHYLOCOCCUS LUGDUNENSIS CRITICAL RESULT CALLED TO, READ BACK BY AND VERIFIED WITH: C GROGAN,RN AT 1230 10/03/17 BY L BENFIELD CONCERNING GROWTH ON CULTURE NO ANAEROBES ISOLATED Performed at [facility], [place] [address] [place], [place] [address]    Report Status 10/07/2017 FINAL  Final   Organism ID, Bacteria STAPHYLOCOCCUS LUGDUNENSIS  Final      Susceptibility   Staphylococcus lugdunensis - MIC*    CIPROFLOXACIN <=0.5 SENSITIVE Sensitive     ERYTHROMYCIN >=8 RESISTANT Resistant     GENTAMICIN <=0.5 SENSITIVE Sensitive     OXACILLIN 0.5 SENSITIVE Sensitive     TETRACYCLINE <=1 SENSITIVE Sensitive     VANCOMYCIN <=0.5 SENSITIVE Sensitive     TRIMETH/SULFA <=10 SENSITIVE Sensitive     CLINDAMYCIN >=8 RESISTANT Resistant     RIFAMPIN <=0.5 SENSITIVE Sensitive     Inducible Clindamycin NEGATIVE Sensitive     * FEW STAPHYLOCOCCUS LUGDUNENSIS  Blood culture (routine x 2)     Status: None (Preliminary result)   Collection Time: 10/07/17 11:12 AM  Result Value Ref Range Status   Specimen Description BLOOD RIGHT HAND  Final   Special Requests   Final    BOTTLES DRAWN AEROBIC AND ANAEROBIC Blood Culture adequate volume   Culture   Final    NO GROWTH 2 DAYS Performed at [facility], [address], [place], [place] [address]    Report Status PENDING  Incomplete  Blood culture (routine x 2)     Status: None (Preliminary result)   Collection Time: 10/07/17 12:18 PM  Result Value Ref Range Status   Specimen Description BLOOD BLOOD RIGHT HAND  Final   Special Requests   Final    BOTTLES DRAWN AEROBIC AND ANAEROBIC Blood Culture adequate volume   Culture   Final    NO GROWTH 2 DAYS Performed  at [facility], [address] [place], [place] [address]    Report Status PENDING  Incomplete    Coagulation Studies: No results for input(s): LABPROT, INR in the last 72 hours.  Urinalysis: No results for input(s): COLORURINE, LABSPEC, PHURINE, GLUCOSEU, HGBUR, BILIRUBINUR, KETONESUR, PROTEINUR, UROBILINOGEN, NITRITE, LEUKOCYTESUR in the last 72 hours.  Invalid input(s): APPERANCEUR    Imaging: No results found.   Medications:   .  dextrose 30 mL/hr at 10/09/17 1543      Assessment/ Plan:  57 y.o. female with diabetes, hypertension, hyperlipidemia, diabetic retinopathy, diabetic neuropathy, diabetic gastroparesis, pancreatic insufficiency, depression, GERD. Renal masses, M-spike   MWF [facility] [place], Right AVG  1. 2. Hyperkalemia - patient has not dialyzed since Monday per ER report - cannot get SPS due to sever GI symptoms - her outpatient unit did not have a spot  - urgent HD    2.  Gastroparesis causing nausea and vomiting Symptomatic control    3. Anemia of chronic kidney disease: hemoglobin 9.2 - [facility] as outpatient.   4. Secondary Hyperparathyroidism   -  HOLD sevelamer until nausea and vomiting are resolved  5. End Stage Renal Disease: hemodialysis MWF with hyperkalemia - Patient seen during dialysis Tolerating well    LOS: 0 Javier Gell Candiss Norse 1/18/20195:04 PM  [place], [place]

## 2017-10-09 NOTE — ED Notes (Signed)
Pt blood sugar 41. Given more juice and meal tray per Paduchowski, MD. Will continue to monitor before sending to dialysis

## 2017-10-09 NOTE — ED Notes (Signed)
Called lab for green top redraw

## 2017-10-09 NOTE — ED Notes (Addendum)
Pt ready for dialysis, being trasnported

## 2017-10-09 NOTE — ED Notes (Signed)
Pt given juice, will recheck sugar. PT alert and oriented, no pain

## 2017-10-09 NOTE — Progress Notes (Signed)
PRE HD assessment 

## 2017-10-09 NOTE — ED Provider Notes (Addendum)
[facility] Emergency Department Provider Note  Time seen: 9:40 AM  I have reviewed the triage vital signs and the nursing notes.   HISTORY  Chief Complaint Foot Pain and Nausea    HPI [name] is a 57 y.o. female with a past medical history of anemia, anxiety, COPD, CAD, diabetes, end-stage renal disease on hemodialysis Monday, Wednesday, Friday, hypertension, MI, presents to the emergency department for nausea vomiting leg cramping and left toe pain.  According to the patient and record review the patient had a recent left great toe amputation performed.  Is currently taking antibiotics at home.  Patient was discharged from the hospital approximately 4 days ago, was back in the emergency department 2 days ago for evaluation for nausea vomiting left leg/toe pain.  Patient was ultimately discharged home, was supposed to go to dialysis today but states the pain nausea and vomiting returned so she came to the emergency department for evaluation.  Patient admits that the nausea vomiting abdominal discomfort have been an ongoing issue for many years.  States since she had the left great toe amputation 1 week ago she has been experiencing severe pain over the left toe was as well as cramping in her left leg at times.  Patient states she is prescribed antibiotics but was not able to keep anything down today.  Also states she cannot keep her pain medication down.  Patient denies any fever.  Past Medical History:  Diagnosis Date  . Anemia   . Anginal pain ([place])   . Anxiety   . Arthritis   . Asthma   . Broken wrist   . Bronchitis   . chronic diastolic CHF 3/53/6144  . COPD (chronic obstructive pulmonary disease) ([place])   . Coronary artery disease    a. cath 2013: stenting to RCA (report not available); b. cath 2014: LM nl, pLAD 40%, mLAD nl, ost LCx 40%, mid LCx nl, pRCA 30% @ site of prior stent, mRCA 50%  . Depression   . Diabetes mellitus without complication ([place])    . Diabetic neuropathy ([place])   . dialysis 2006  . Diverticulosis   . Dizziness   . Dyspnea   . Elevated lipids   . Environmental and seasonal allergies   . ESRD (end stage renal disease) on dialysis ([place])    M-W-F  . GERD (gastroesophageal reflux disease)   . Headache   . History of hiatal hernia   . HOH (hard of hearing)   . Hx of pancreatitis 2015  . Hypertension   . Lower extremity edema   . Mitral regurgitation    a. echo 10/2013: EF 62%, noWMA, mildly dilated LA, mild to mod MR/TR, GR1DD  . Myocardial infarction ([place])   . Orthopnea   . Pneumonia   . Renal cancer ([place])   . Renal insufficiency    Pt is on dialysis on M,W + F.  . Wheezing     Patient Active Problem List   Diagnosis Date Noted  . Osteomyelitis ([place]) 09/30/2017  . Carotid stenosis 06/18/2017  . Shortness of breath 05/04/2017  . Cellulitis of lower extremity 07/29/2016  . Chronic venous insufficiency 07/29/2016  . Lymphedema 07/29/2016  . TIA (transient ischemic attack) 04/21/2016  . Altered mental status 04/08/2016  . Hyperammonemia ([place]) 04/08/2016  . Elevated troponin 04/08/2016  . Depression 04/08/2016  . Depression, major, recurrent, severe with psychosis ([place]) 04/08/2016  . Blood in stool   . Intractable cyclical vomiting with nausea   .  Reflux esophagitis   . Gastritis   . Generalized abdominal pain   . Uncontrollable vomiting   . Major depressive disorder, recurrent episode, moderate ([place]) 03/15/2016  . Adjustment disorder with mixed anxiety and depressed mood 03/15/2016  . Malnutrition of moderate degree 12/01/2015  . Renal mass   . Dyspnea   . Acute renal failure ([place])   . Respiratory failure ([place])   . High temperature 11/14/2015  . Pulmonary edema   . Encounter for central line placement   . Encounter for orogastric (OG) tube placement   . Nausea 11/12/2015  . Hyperkalemia 10/03/2015  . Diarrhea, unspecified 07/22/2015  . Pneumonia 05/21/2015  . Hypoglycemia 04/24/2015  .  Unresponsiveness 04/24/2015  . Bradycardia 04/24/2015  . Hypothermia 04/24/2015  . Acute respiratory failure ([place]) 04/24/2015  . Acute diastolic CHF (congestive heart failure) ([place]) 04/05/2015  . Diabetic gastroparesis ([place]) 04/05/2015  . Hypokalemia 04/05/2015  . Generalized weakness 04/05/2015  . Acute pulmonary edema ([place]) 04/03/2015  . Nausea and vomiting 04/03/2015  . Hypoglycemia associated with diabetes ([place]) 04/03/2015  . Anemia of chronic disease 04/03/2015  . Secondary hyperparathyroidism ([place]) 04/03/2015  . Pressure ulcer 04/02/2015  . Acute respiratory failure with hypoxia ([place]) 04/01/2015  . Adjustment disorder with anxiety 03/14/2015  . Somatic symptom disorder, mild 03/08/2015  . Coronary artery disease involving native coronary artery of native heart without angina pectoris   . Nausea & vomiting 03/06/2015  . Abdominal pain 03/06/2015  . DM (diabetes mellitus) ([place]) 03/06/2015  . HTN (hypertension) 03/06/2015  . Gastroparesis 02/24/2015  . Pleural effusion 02/19/2015  . HCAP (healthcare-associated pneumonia) 02/19/2015  . End-stage renal disease on hemodialysis ([place]) 02/19/2015    Past Surgical History:  Procedure Laterality Date  . ABDOMINAL HYSTERECTOMY  1992  . AMPUTATION TOE Left 10/02/2017   Procedure: AMPUTATION TOE-LEFT GREAT TOE;  Surgeon: Albertine Patricia, DPM;  Location: [facility] ORS;  Service: Podiatry;  Laterality: Left;  . APPENDECTOMY    . CARDIAC CATHETERIZATION Left 07/26/2015   Procedure: Left Heart Cath and Coronary Angiography;  Surgeon: Dionisio David, MD;  Location: [place] CV LAB;  Service: Cardiovascular;  Laterality: Left;  . CATARACT EXTRACTION W/ INTRAOCULAR LENS IMPLANT Right   . CATARACT EXTRACTION W/PHACO Left 03/10/2017   Procedure: CATARACT EXTRACTION PHACO AND INTRAOCULAR LENS PLACEMENT (IOC);  Surgeon: Birder Robson, MD;  Location: [facility] ORS;  Service: Ophthalmology;  Laterality: Left;  [place] 00:51.9 AP% 14.2 CDE 7.39 fluid pack  lot # [id number]  . CHOLECYSTECTOMY    . COLONOSCOPY WITH PROPOFOL N/A 08/12/2016   Procedure: COLONOSCOPY WITH PROPOFOL;  Surgeon: Lollie Sails, MD;  Location: [facility] ENDOSCOPY;  Service: Endoscopy;  Laterality: N/A;  . DIALYSIS FISTULA CREATION Left    upper arm  . dialysis grafts    . ESOPHAGOGASTRODUODENOSCOPY N/A 03/08/2015   Procedure: ESOPHAGOGASTRODUODENOSCOPY (EGD);  Surgeon: Manya Silvas, MD;  Location: [facility] ENDOSCOPY;  Service: Endoscopy;  Laterality: N/A;  . ESOPHAGOGASTRODUODENOSCOPY (EGD) WITH PROPOFOL N/A 03/18/2016   Procedure: ESOPHAGOGASTRODUODENOSCOPY (EGD) WITH PROPOFOL;  Surgeon: [name], MD;  Location: [facility] ENDOSCOPY;  Service: Endoscopy;  Laterality: N/A;  . EYE SURGERY Right 2018  . FECAL TRANSPLANT N/A 08/23/2015   Procedure: FECAL TRANSPLANT;  Surgeon: Manya Silvas, MD;  Location[facility]COPY;  Service: Endoscopy;  Laterality: N/A;  . HAND SURGERY Bilateral   . PERIPHERAL VASCULAR CATHETERIZATION N/A 12/20/2015   Procedure: Thrombectomy of dialysis access versus permcath placement;  Surgeon: Algernon Huxley, MD;  Location: [place] CV LAB;  Service: Cardiovascular;  Laterality: N/A;  . PERIPHERAL VASCULAR CATHETERIZATION N/A 12/20/2015   Procedure: A/V Shunt Intervention;  Surgeon: Algernon Huxley, MD;  Location: [place] CV LAB;  Service: Cardiovascular;  Laterality: N/A;  . PERIPHERAL VASCULAR CATHETERIZATION N/A 12/20/2015   Procedure: A/V Shuntogram/Fistulagram;  Surgeon: Algernon Huxley, MD;  Location: [place] CV LAB;  Service: Cardiovascular;  Laterality: N/A;  . PERIPHERAL VASCULAR CATHETERIZATION N/A 01/02/2016   Procedure: A/V Shuntogram/Fistulagram;  Surgeon: Algernon Huxley, MD;  Location: [place] CV LAB;  Service: Cardiovascular;  Laterality: N/A;  . PERIPHERAL VASCULAR CATHETERIZATION N/A 01/02/2016   Procedure: A/V Shunt Intervention;  Surgeon: Algernon Huxley, MD;  Location: [place] CV LAB;  Service: Cardiovascular;  Laterality: N/A;     Prior to Admission medications   Medication Sig Start Date End Date Taking? Authorizing Provider  acidophilus (RISAQUAD) CAPS capsule Take 1 capsule by mouth daily.    [provider]  albuterol (PROVENTIL HFA;VENTOLIN HFA) 108 (90 Base) MCG/ACT inhaler Inhale 2 puffs into the lungs every 6 (six) hours as needed for shortness of breath.     [provider]  albuterol (PROVENTIL) (2.5 MG/3ML) 0.083% nebulizer solution Take 2.5 mg by nebulization every 6 (six) hours as needed for wheezing or shortness of breath.    [provider]  alum & mag hydroxide-simeth (MAALOX MAX) 400-400-40 MG/5ML suspension Take 5 mLs by mouth every 6 (six) hours as needed for indigestion. 06/09/17   Rudene Re, MD  amLODipine (NORVASC) 10 MG tablet Take 10 mg by mouth daily.    [provider]  aspirin EC 81 MG tablet Take 81 mg by mouth daily. 09/08/16   [provider]  atorvastatin (LIPITOR) 20 MG tablet Take 20 mg by mouth at bedtime.  12/02/16   [provider]  cetirizine (ZYRTEC) 10 MG tablet Take 10 mg by mouth daily.     [provider]  cholecalciferol (VITAMIN D) 400 units TABS tablet Take 400 Units by mouth daily.    [provider]  clopidogrel (PLAVIX) 75 MG tablet Take 1 tablet (75 mg total) by mouth daily. 03/25/17 03/25/18  Dustin Flock, MD  cyanocobalamin 1000 MCG tablet Take 1,000 mcg by mouth daily with lunch.     [provider]  dexlansoprazole (DEXILANT) 60 MG capsule Take 60 mg by mouth daily.    [provider]  diphenhydrAMINE (BENADRYL) 25 mg capsule Take 25 mg by mouth every 6 (six) hours as needed for itching.    [provider]  doxycycline (VIBRA-TABS) 100 MG tablet Take 1 tablet (100 mg total) by mouth every 12 (twelve) hours. Patient not taking: Reported on 10/07/2017 10/09/2017   Salary, Holly Bodily D, MD  famotidine (PEPCID) 20 MG tablet Take 1 tablet (20 mg total) by mouth 2 (two) times  daily. Patient not taking: Reported on 09/30/2017 06/09/17 06/09/18  Rudene Re, MD  FLUoxetine (PROZAC) 20 MG capsule Take 1 capsule (20 mg total) by mouth daily. Patient taking differently: Take 20 mg by mouth at bedtime.  04/10/16   Hower, Aaron Mose, MD  glucose 4 GM chewable tablet Chew 3-4 tablets by mouth as needed for low blood sugar (blood sugar less than 60).     [provider]  HYDROcodone-acetaminophen (NORCO/VICODIN) 5-325 MG tablet Take 1 tablet by mouth every 6 (six) hours as needed for moderate pain or severe pain. Patient taking differently: Take 1 tablet by mouth 3 (three) times daily as needed for moderate pain or  severe pain.  05/05/17   Gouru, Illene Silver, MD  insulin detemir (LEVEMIR) 100 UNIT/ML injection Inject 0.02 mLs (2 Units total) into the skin 2 (two) times daily. Patient taking differently: Inject 4 Units into the skin at bedtime.  09/05/17   Epifanio Lesches, MD  lipase/protease/amylase (CREON) 12000 UNITS CPEP capsule Take 24,000-36,000 Units by mouth 3 (three) times daily with meals. Take 36000 mg in the morning, 24000 midday & 36000 in the evening    [provider]  meclizine (ANTIVERT) 12.5 MG tablet Take 1 tablet by mouth three times daily as needed    [provider]  metoCLOPramide (REGLAN) 10 MG tablet Take 1 tablet (10 mg total) by mouth every 8 (eight) hours as needed for nausea or vomiting. Patient not taking: Reported on 10/07/2017 06/08/17   Earleen Newport, MD  multivitamin (RENA-VIT) TABS tablet Take 1 tablet by mouth daily.     [provider]  naloxone [facility]) 0.4 MG/ML injection Inject 0.4 mg into the vein as needed.    [provider]  nitroGLYCERIN (NITROSTAT) 0.4 MG SL tablet Place 0.4 mg under the tongue every 5 (five) minutes x 3 doses as needed.    [provider]  OLANZapine (ZYPREXA) 5 MG tablet Take 5 mg by mouth at bedtime.    [provider]  omega-3 acid ethyl esters  (LOVAZA) 1 g capsule Take 1 g by mouth daily.     [provider]  promethazine (PHENERGAN) 12.5 MG tablet Take 1 tablet (12.5 mg total) by mouth every 6 (six) hours as needed for nausea or vomiting. 10/17/2017   Salary, Avel Peace, MD  ranitidine (ZANTAC) 300 MG tablet Take 300 mg by mouth at bedtime. In the afternoon    [provider]  rifaximin (XIFAXAN) 550 MG TABS tablet Take 550 mg by mouth 2 (two) times daily.    [provider]  sevelamer carbonate (RENVELA) 800 MG tablet Take 800 mg by mouth 3 (three) times daily with meals.    [provider]    Allergies  Allergen Reactions  . Ace Inhibitors Swelling and Anaphylaxis  . Ativan [Lorazepam] Other (See Comments)    Reaction:Hallucinations and headaches Reaction:Hallucinations and headaches Reaction:  Hallucinations and headaches  . Compazine [Prochlorperazine Edisylate] Anaphylaxis and Nausea And Vomiting    Other reaction(s): dystonia from this vs. reglan 23 Jul - patient relates that she takes promethazine frequently with no problems. 23 Jul - patient relates that she takes promethazine frequently with no problems.  . Sumatriptan Succinate     Other reaction(s): delirium and hallucinations per [facility] records  . Dilaudid [Hydromorphone Hcl] Other (See Comments)    Delirium   . Ondansetron Other (See Comments)    hallucinations    . Zofran [Ondansetron Hcl] Other (See Comments)    Reaction:  hallucinations  Reaction:  hallucinations   . Codeine Nausea And Vomiting  . Gabapentin Other (See Comments)    Reaction:  Unknown    . [place] Nausea And Vomiting  . Losartan Nausea Only  . Oxycodone Anxiety  . Prochlorperazine Other (See Comments)    Reaction:  Unknown . Patient does not remember reaction but she does have vertigo and anxiety along with n and v at times. Could be used to treat any of these   . Reglan [Metoclopramide]     Per patient her Dr. Evelina Bucy her off it  Per patient her Dr.  Evelina Bucy her off it   . Scopolamine Other (See  Comments)    Dizziness, also has vertigo already  . Tape Rash  . Tapentadol Rash    Family History  Problem Relation Age of Onset  . Kidney disease Mother   . Diabetes Mother   . Cancer Father   . Kidney disease Sister     Social History Social History   Tobacco Use  . Smoking status: Former Smoker    Packs/day: 0.50    Years: 40.00    Pack years: 20.00    Types: Cigarettes    Last attempt to quit: 02/13/2015    Years since quitting: 2.6  . Smokeless tobacco: Never Used  Substance Use Topics  . Alcohol use: Yes    Alcohol/week: 0.6 oz    Types: 1 Glasses of wine per week    Comment: glass wine week per pt  . Drug use: Yes    Types: Marijuana    Comment: once a day    Review of Systems Constitutional: Negative for fever. Eyes: Negative for visual complaints ENT: Negative for recent illness/congestion Cardiovascular: Negative for chest pain. Respiratory: Negative for shortness of breath. Gastrointestinal: Patient states epigastric pain, nausea vomiting. Genitourinary: On dialysis Musculoskeletal: Patient states intermittent left leg cramping and pain in the left great toe stump. Skin: Recent amputation of the left great toe Neurological: Negative for headache All other ROS negative  ____________________________________________   PHYSICAL EXAM:  VITAL SIGNS: ED Triage Vitals [10/09/17 0928]  Enc Vitals Group     BP (!) 192/54     Pulse Rate 72     Resp 16     Temp 97.7 F (36.5 C)     Temp Source Oral     SpO2 100 %     Weight 184 lb (83.5 kg)     Height 5\' 5"  (1.651 m)     Head Circumference      Peak Flow      Pain Score      Pain Loc      Pain Edu?      Excl. in [place]?    Constitutional: Alert and oriented.  Moderate distress due to nausea and pain, keeps grabbing her left leg saying it is cramp pain, crying and yelling at times. Eyes: Normal exam ENT   Head: Normocephalic and atraumatic.    Mouth/Throat: Mucous membranes are moist. Cardiovascular: Normal rate, regular rhythm.  Respiratory: Normal respiratory effort without tachypnea nor retractions. Breath sounds are clear Gastrointestinal: Soft and nontender. No distention.  Musculoskeletal: Patient has a recent amputation of the left great toe with a well-appearing incision with sutures present.  No dehiscence, no drainage or signs of infection.  No lower extremity edema or calf tenderness.  No lower extremity erythema. Neurologic:  Normal speech and language. No gross focal neurologic deficits, moves all extremities. Skin:  Skin is warm, dry  Psychiatric: Mood and affect are normal.   ____________________________________________     INITIAL IMPRESSION / ASSESSMENT AND PLAN / ED COURSE  Pertinent labs & imaging results that were available during my care of the patient were reviewed by me and considered in my medical decision making (see chart for details).  Patient presents to the emergency department for nausea vomiting abdominal discomfort or leg cramping leg pain.  States she cannot go to dialysis today because the pain and vomiting were so significant.  So she came to the emergency department instead.  Differential would include gastroparesis, gastritis, cyclical vomiting, phantom pain, muscle cramping.  Overall patient appears well but will  occasionally grab her leg and yell out in pain.  States she has not been able take her pain medicine due to nausea.  Patient admits to nausea vomiting abdominal discomfort is chronic and has been an ongoing issue for years but states she is tired of it and wants [place] to get to the bottom of it.  Patient's recent amputation appears well, well-appearing incision with no signs of infection or dehiscence or drainage.  We will check labs, treat pain and nausea.  Patient is scheduled for dialysis today at 10:20 AM, we will call her dialysis center to see if she can be rescheduled for later this  afternoon as long as the patient's workup is normal.  Very difficult stick.  Was able to obtain blood and IV with ultrasound guidance.  Patient's lab work shows an elevated potassium of 5.5.  I discussed the patient with Dr. Candiss Norse of nephrology.  Unfortunately the patient's dialysis center is not able to fit her in for dialysis today.  Dr. Candiss Norse states he will arrange for the patient have dialysis from the emergency department and then we can discharge the patient home.  Patient states she feels much better, the nausea is gone and her pain is gone at this time.  We will likely discharge with a course of Phenergan as well as Phenergan suppositories.  Patient has pain medication at home.  Patient's blood glucose is low at 52 on chemistry.  Patient was given juice, has eaten food.  She is awake talking, is in absolutely no distress, no somnolence.  Recheck shows a glucose of 41.  We will give half an amp of D50 and continue to closely monitor.   ____________________________________________   FINAL CLINICAL IMPRESSION(S) / ED DIAGNOSES  Nausea vomiting Musculoskeletal pain    Harvest Dark, MD 10/09/17 1330    Harvest Dark, MD 10/09/17 1510

## 2017-10-09 NOTE — ED Notes (Signed)
E-signature pad not available at time of DC.

## 2017-10-09 NOTE — ED Notes (Signed)
Repeat CBG of 92. Pt is encouraged to eat and drink at this time but is medically stable per MD [facility] for DC.

## 2017-10-09 NOTE — ED Notes (Signed)
Pt resting comfortably at this time.

## 2017-10-09 NOTE — ED Notes (Signed)
Checked on pt, updated that we are waiting for dialysis to take pt. Pt understands. Resting comfortably.

## 2017-10-09 NOTE — ED Provider Notes (Signed)
patient returns from dialysis. She is doing well. Her blood sugars within normal limits. We will feed her again discharge her.   Nena Polio, MD 10/09/17 2024

## 2017-10-09 NOTE — ED Triage Notes (Signed)
Pt came to ED via EMS from home. Pt has partial toe amputation Friday on left foot. Pt c/o foot pain, pt screaming "take the boot off" upon arrival. Also c/o nausea. Has been taking Vicodin for pain.

## 2017-10-09 NOTE — Progress Notes (Signed)
PRE HD   

## 2017-10-09 NOTE — ED Notes (Signed)
Pt back from dialysis to ED.

## 2017-10-09 NOTE — ED Notes (Signed)
Pt given meal tray and is eating at this time.

## 2017-10-12 LAB — CULTURE, BLOOD (ROUTINE X 2)
Culture: NO GROWTH
Culture: NO GROWTH
Special Requests: ADEQUATE
Special Requests: ADEQUATE

## 2017-10-15 ENCOUNTER — Emergency Department: Payer: Medicare Other

## 2017-10-15 ENCOUNTER — Emergency Department
Admission: EM | Admit: 2017-10-15 | Discharge: 2017-10-15 | Disposition: A | Discharge status: Home / Self Care | Payer: Medicare Other | Attending: Student in an Organized Health Care Education/Training Program | Admitting: Student in an Organized Health Care Education/Training Program

## 2017-10-15 ENCOUNTER — Encounter: Payer: Self-pay | Admitting: Emergency Medicine

## 2017-10-15 DIAGNOSIS — I252 Old myocardial infarction: Secondary | ICD-10-CM | POA: Insufficient documentation

## 2017-10-15 DIAGNOSIS — Z87891 Personal history of nicotine dependence: Secondary | ICD-10-CM | POA: Diagnosis not present

## 2017-10-15 DIAGNOSIS — J45909 Unspecified asthma, uncomplicated: Secondary | ICD-10-CM | POA: Insufficient documentation

## 2017-10-15 DIAGNOSIS — R079 Chest pain, unspecified: Secondary | ICD-10-CM | POA: Diagnosis present

## 2017-10-15 DIAGNOSIS — Z85528 Personal history of other malignant neoplasm of kidney: Secondary | ICD-10-CM | POA: Insufficient documentation

## 2017-10-15 DIAGNOSIS — Z7901 Long term (current) use of anticoagulants: Secondary | ICD-10-CM | POA: Insufficient documentation

## 2017-10-15 DIAGNOSIS — Z7982 Long term (current) use of aspirin: Secondary | ICD-10-CM | POA: Insufficient documentation

## 2017-10-15 DIAGNOSIS — Z79899 Other long term (current) drug therapy: Secondary | ICD-10-CM | POA: Insufficient documentation

## 2017-10-15 DIAGNOSIS — R252 Cramp and spasm: Secondary | ICD-10-CM | POA: Insufficient documentation

## 2017-10-15 DIAGNOSIS — M79605 Pain in left leg: Secondary | ICD-10-CM | POA: Insufficient documentation

## 2017-10-15 DIAGNOSIS — E1140 Type 2 diabetes mellitus with diabetic neuropathy, unspecified: Secondary | ICD-10-CM | POA: Diagnosis not present

## 2017-10-15 DIAGNOSIS — I5032 Chronic diastolic (congestive) heart failure: Secondary | ICD-10-CM | POA: Insufficient documentation

## 2017-10-15 DIAGNOSIS — I132 Hypertensive heart and chronic kidney disease with heart failure and with stage 5 chronic kidney disease, or end stage renal disease: Secondary | ICD-10-CM | POA: Diagnosis not present

## 2017-10-15 DIAGNOSIS — E1122 Type 2 diabetes mellitus with diabetic chronic kidney disease: Secondary | ICD-10-CM | POA: Diagnosis not present

## 2017-10-15 DIAGNOSIS — Z992 Dependence on renal dialysis: Secondary | ICD-10-CM | POA: Insufficient documentation

## 2017-10-15 DIAGNOSIS — N186 End stage renal disease: Secondary | ICD-10-CM | POA: Diagnosis not present

## 2017-10-15 LAB — CBC
HCT: 31.9 % — ABNORMAL LOW (ref 35.0–47.0)
Hemoglobin: 10.3 g/dL — ABNORMAL LOW (ref 12.0–16.0)
MCH: 30.4 pg (ref 26.0–34.0)
MCHC: 32.4 g/dL (ref 32.0–36.0)
MCV: 93.7 fL (ref 80.0–100.0)
Platelets: 253 10*3/uL (ref 150–440)
RBC: 3.4 MIL/uL — ABNORMAL LOW (ref 3.80–5.20)
RDW: 17.7 % — ABNORMAL HIGH (ref 11.5–14.5)
WBC: 5.9 10*3/uL (ref 3.6–11.0)

## 2017-10-15 LAB — TROPONIN I: Troponin I: <0.03 ng/mL (ref <0.03)

## 2017-10-15 LAB — BASIC METABOLIC PANEL
Anion gap: 13 (ref 5–15)
BUN: 9 mg/dL (ref 6–20)
CO2: 27 mmol/L (ref 22–32)
Calcium: 8.5 mg/dL — ABNORMAL LOW (ref 8.9–10.3)
Chloride: 96 mmol/L — ABNORMAL LOW (ref 101–111)
Creatinine, Ser: 4.57 mg/dL — ABNORMAL HIGH (ref 0.44–1.00)
GFR calc Af Amer: 11 mL/min — ABNORMAL LOW (ref >60)
GFR calc non Af Amer: 10 mL/min — ABNORMAL LOW (ref >60)
Glucose, Bld: 91 mg/dL (ref 65–99)
Potassium: 3.8 mmol/L (ref 3.5–5.1)
Sodium: 136 mmol/L (ref 135–145)

## 2017-10-15 IMAGING — CT CT HEAD W/O CM
3 of 4 series · 14 of 47 positions shown, 16 images · non-contrast
Comparison: Head CT  04/21/2016

CLINICAL DATA: Altered mental status

EXAM:
CT HEAD WITHOUT CONTRAST
TECHNIQUE: Contiguous axial images were obtained from the base of the skull
through the vertex without intravenous contrast.

[Series 4: head wo · axial · 0.36mm/px · z∈[-110,+24]mm · 8 of 33 slices shown, 10 images]
[im 3/33  brain]
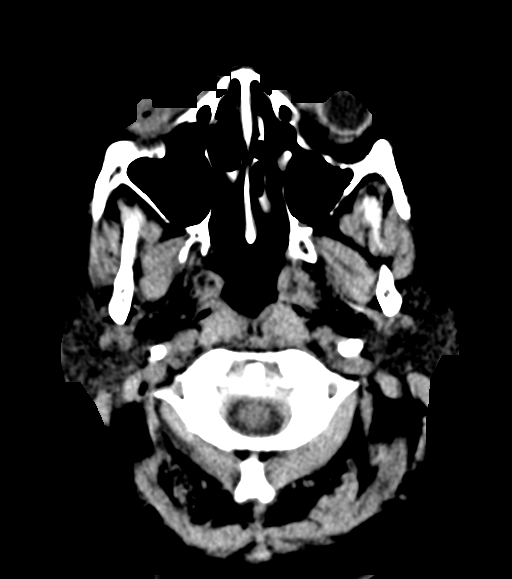
[im 3/33  bone]
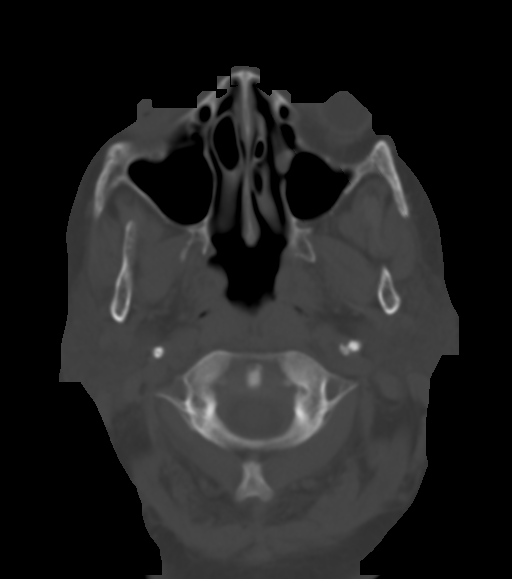
[im 7/33  brain]
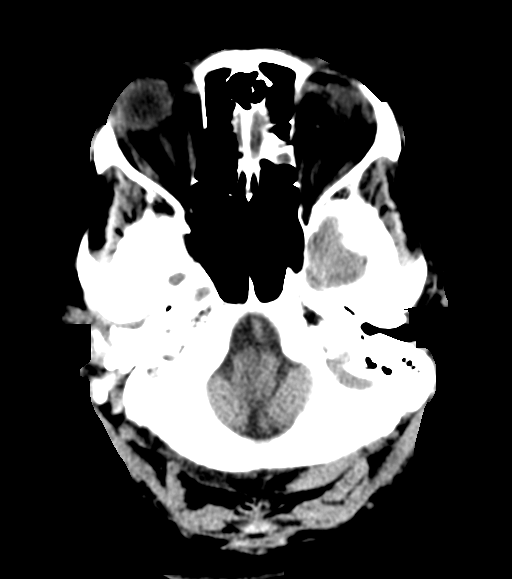
[im 11/33  brain]
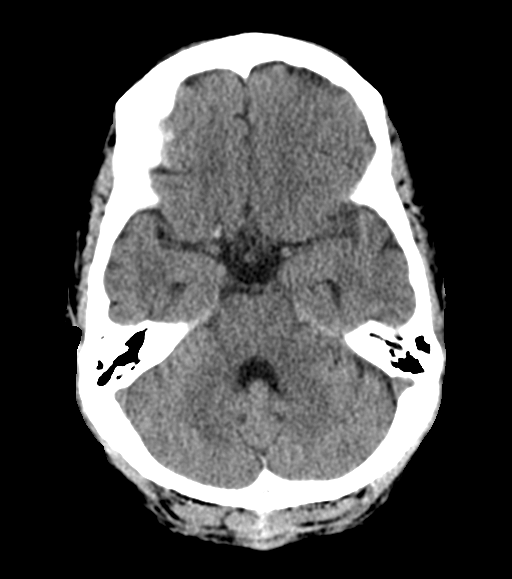
[im 15/33  brain]
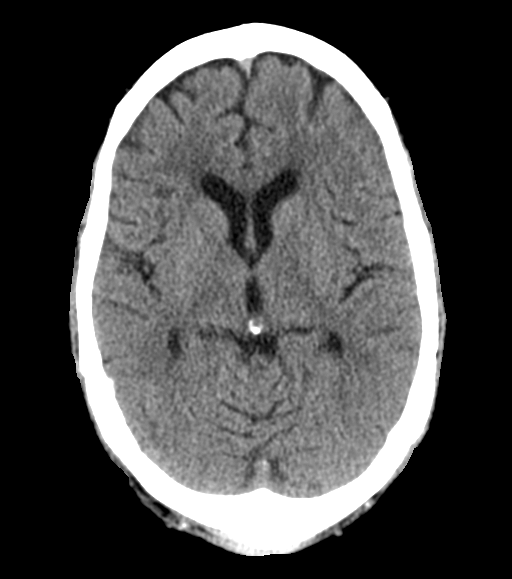
[im 18/33  brain]
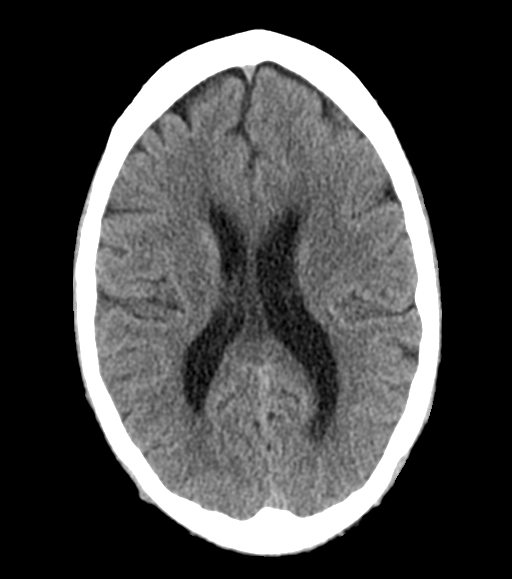
[im 18/33  bone]
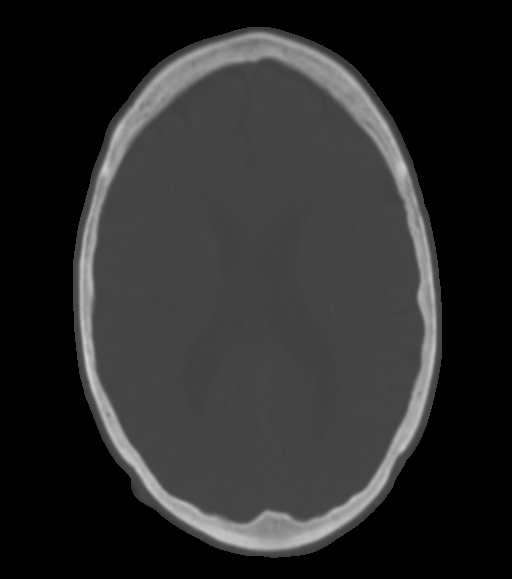
[im 22/33  brain]
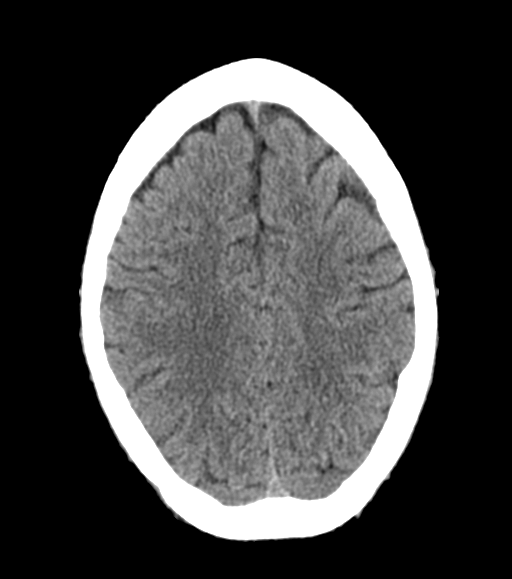
[im 26/33  brain]
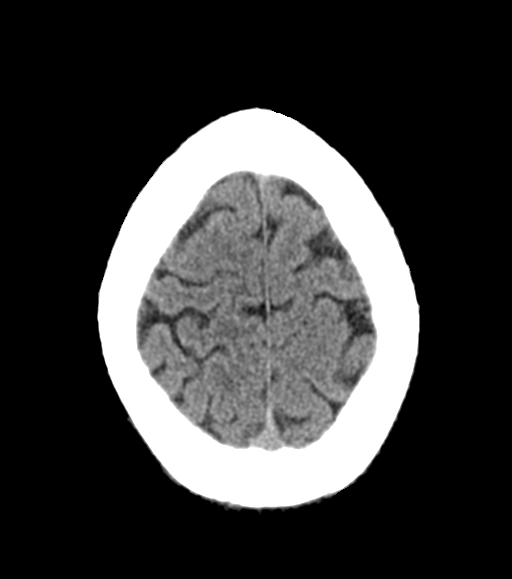
[im 30/33  brain]
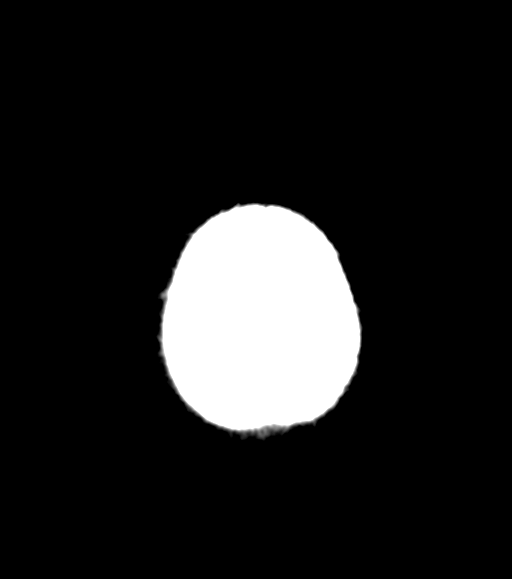

[Series 6: coronal soft tissue · coronal · 0.32mm/px · 3 of 69 slices shown]
[im 23/69  brain]
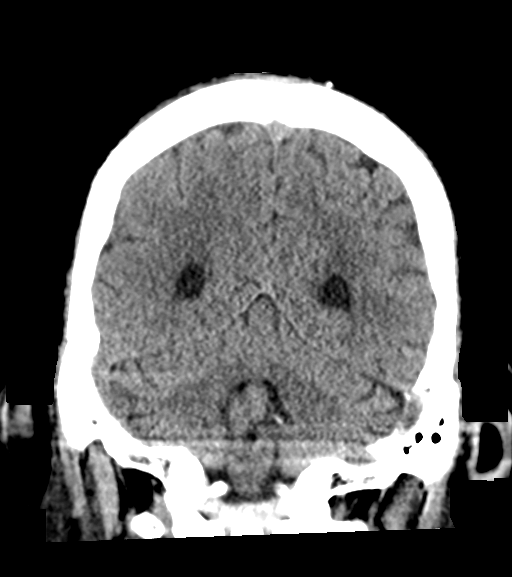
[im 31/69  brain]
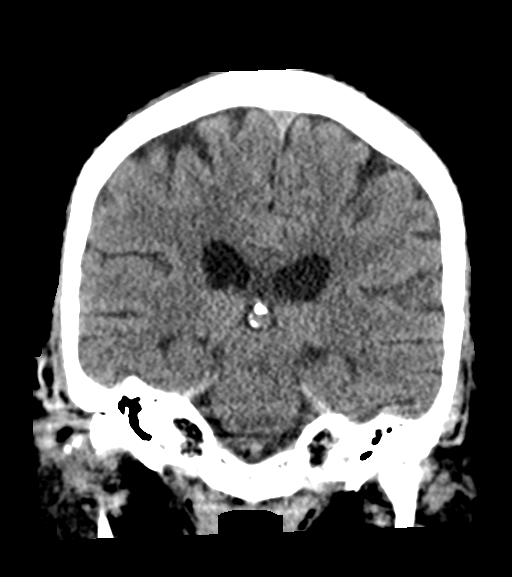
[im 38/69  brain]
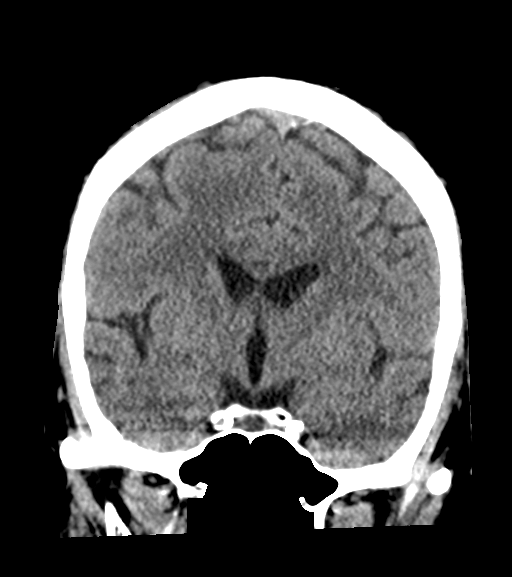

[Series 7: sagittal soft tissue · sagittal · 0.34mm/px · 3 of 55 slices shown]
[im 19/55  brain]
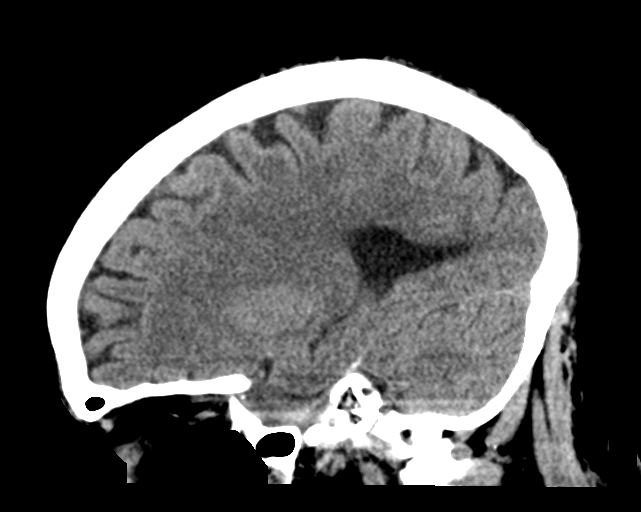
[im 28/55  brain]
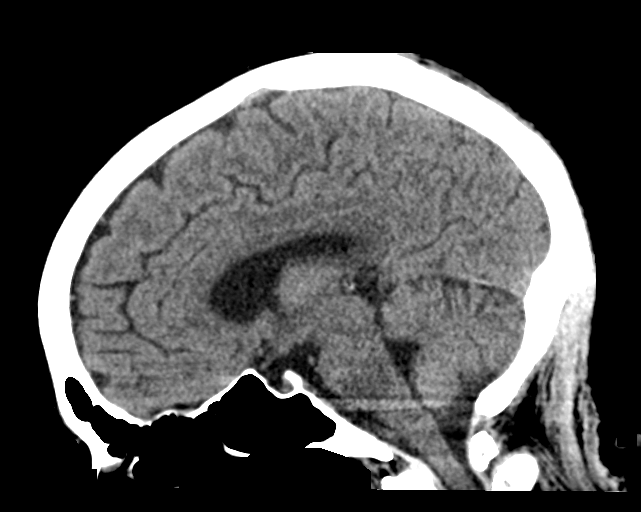
[im 37/55  brain]
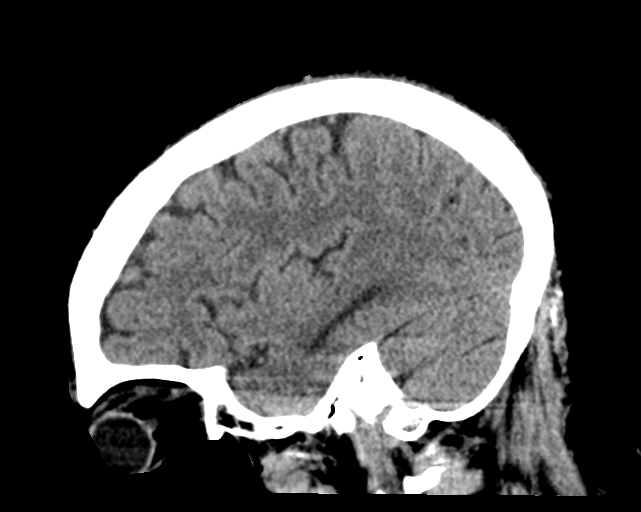

[14 of 47 positions shown; findings below may reference images not displayed]

FINDINGS: Brain: No acute intracranial hemorrhage. No focal mass lesion. No CT
evidence of acute infarction. No midline shift or mass effect. No
hydrocephalus. Basilar cisterns are patent.

Vascular: No hyperdense vessel or unexpected calcification.

Skull: Normal. Negative for fracture or focal lesion.

Sinuses/Orbits: Paranasal sinuses and mastoid air cells are clear.
Orbits are clear.

Other: None.
IMPRESSION: No acute intracranial findings.

## 2017-10-15 IMAGING — CT CT ABD-PELV W/ CM
2 of 5 series · 15 of 46 positions shown, 17 images · IV contrast (APPLIED)
Comparison: 01/29/2017

CLINICAL DATA: Lethargic and less response of the since last
evening. Abdominal pain common nausea, vomiting and diarrhea.

EXAM:
CT ABDOMEN AND PELVIS WITH CONTRAST
TECHNIQUE: Multidetector CT imaging of the abdomen and pelvis was performed
using the standard protocol following bolus administration of
intravenous contrast.
CONTRAST:  100mL [id number] IOPAMIDOL ([id number]) INJECTION 61%

[Series 2: routine abd/pel with · axial · 0.80mm/px · z∈[-795,-395]mm · 12 of 90 slices shown, 14 images]
[im 5/90  soft-tissue]
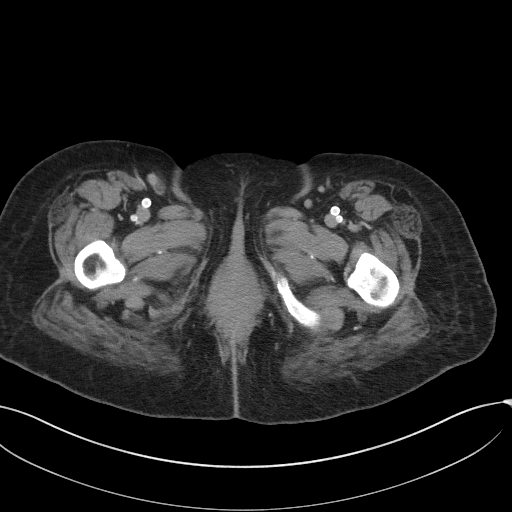
[im 5/90  bone]
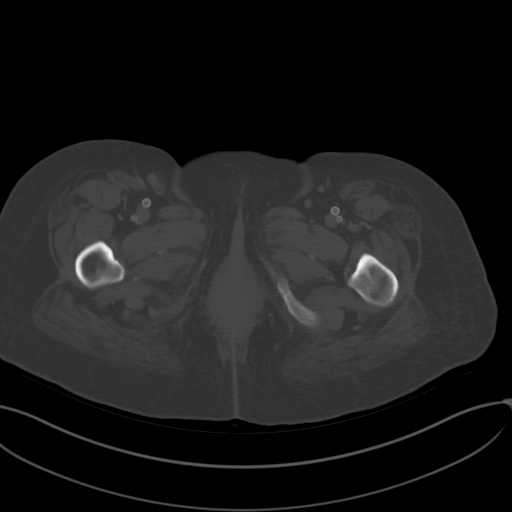
[im 14/90  soft-tissue]
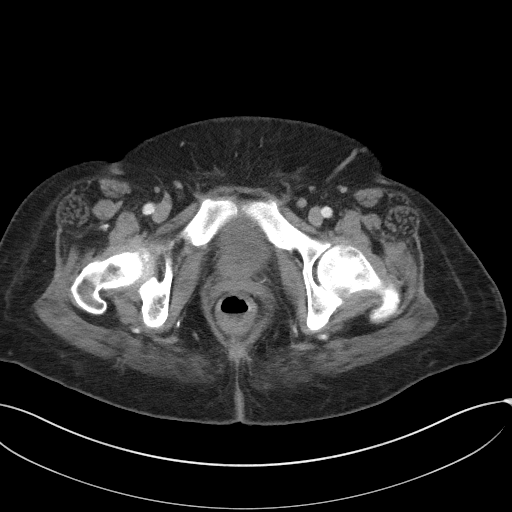
[im 18/90  soft-tissue]
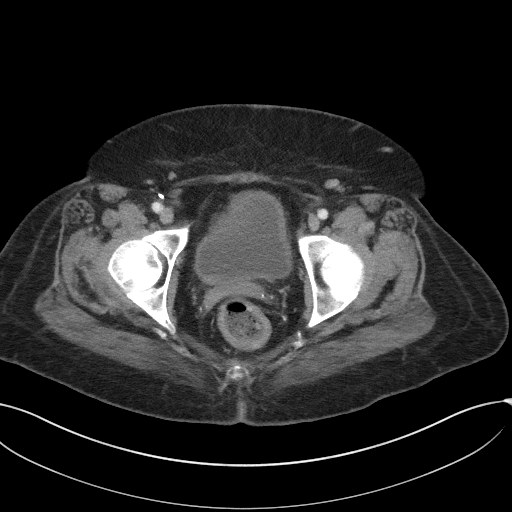
[im 27/90  soft-tissue]
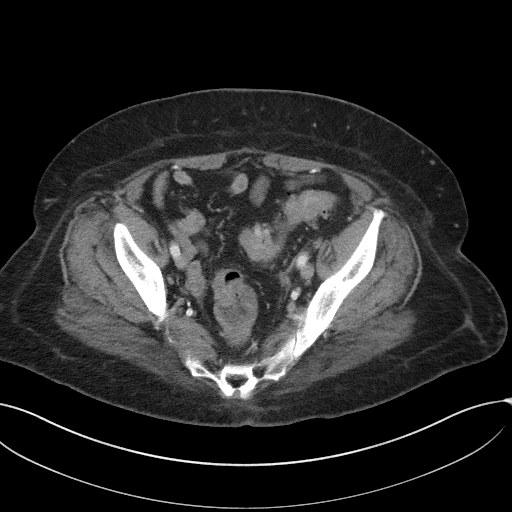
[im 36/90  soft-tissue]
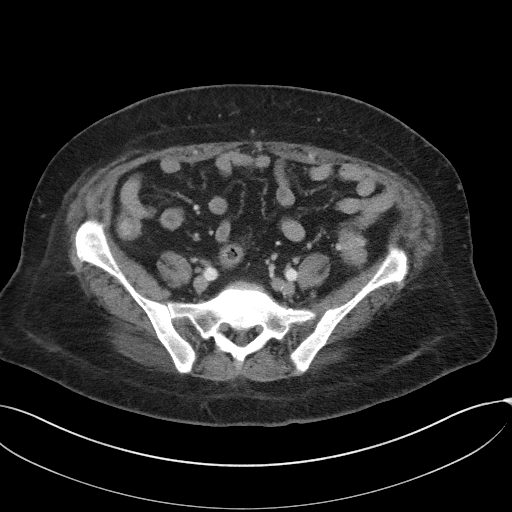
[im 41/90  soft-tissue]
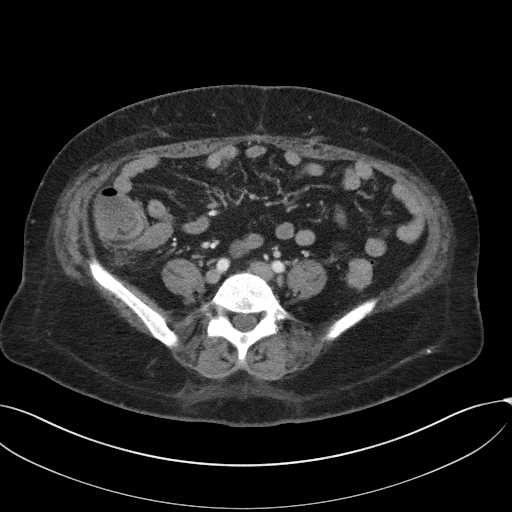
[im 49/90  soft-tissue]
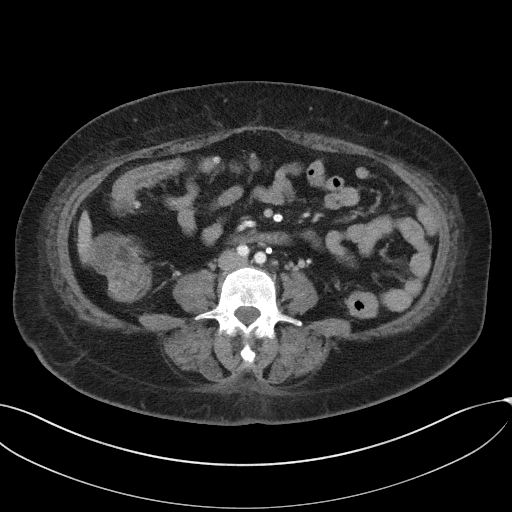
[im 54/90  soft-tissue]
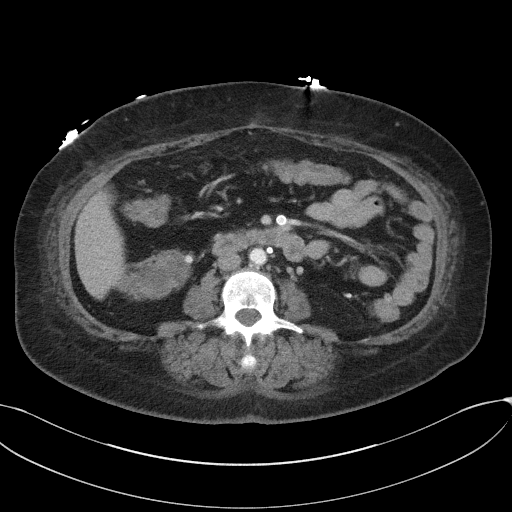
[im 63/90  soft-tissue]
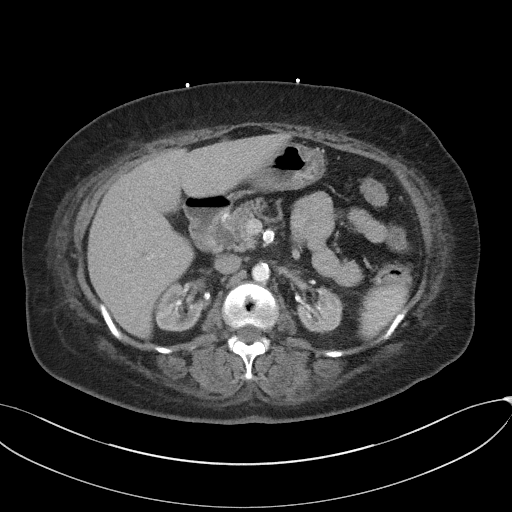
[im 63/90  bone]
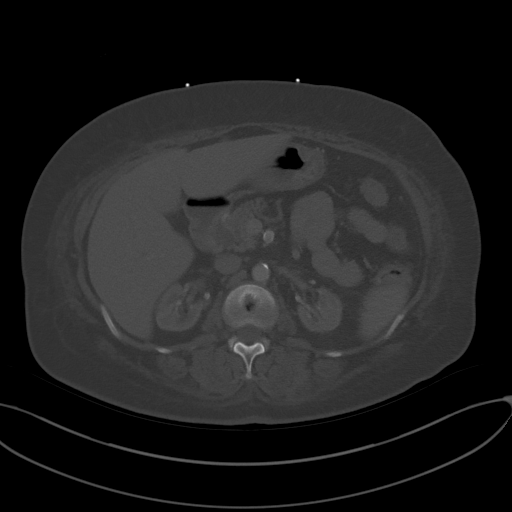
[im 72/90  soft-tissue]
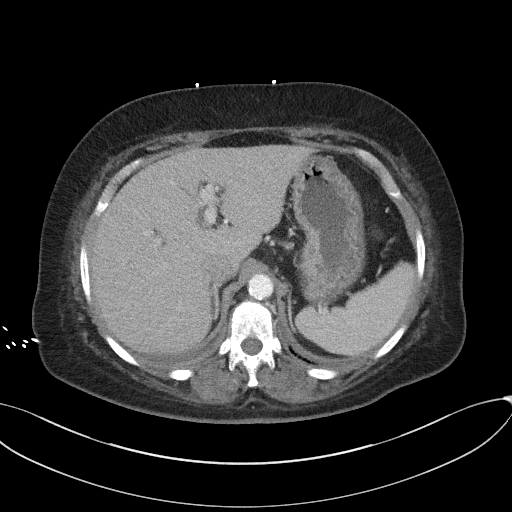
[im 76/90  soft-tissue]
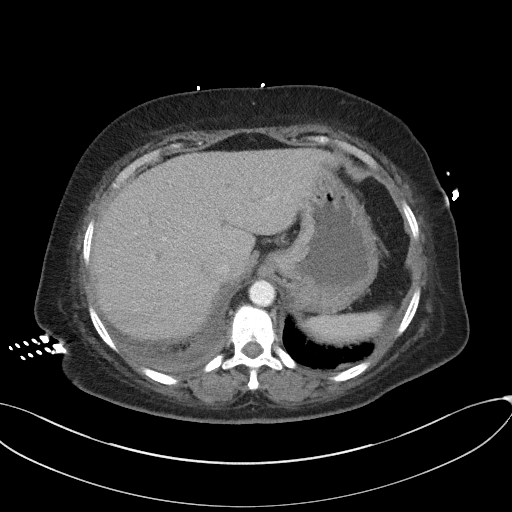
[im 85/90  soft-tissue]
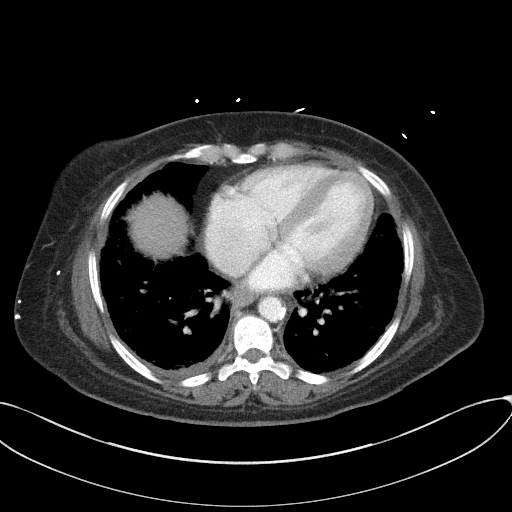

[Series 5: coronal st · coronal · 0.67mm/px · 3 of 95 slices shown]
[im 32/95  soft-tissue]
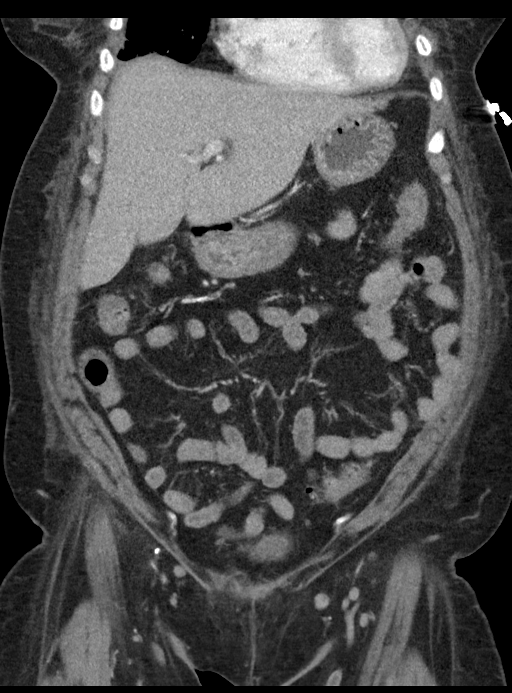
[im 42/95  soft-tissue]
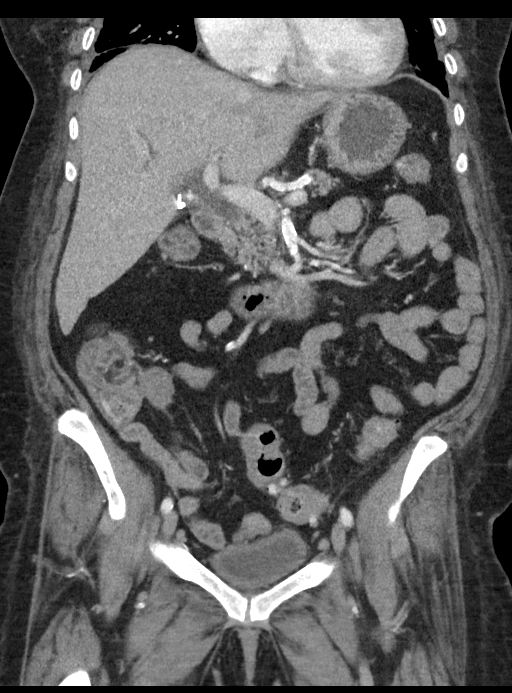
[im 53/95  soft-tissue]
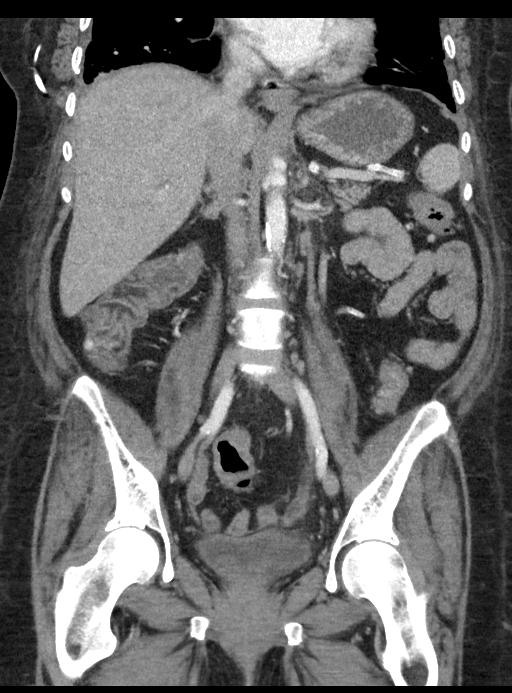

[15 of 46 positions shown; findings below may reference images not displayed]

FINDINGS: Lower chest: Small right pleural effusion and bibasilar atelectasis.
The heart is upper limits of normal in size. No pericardial
effusion. Age advanced atherosclerotic calcifications involving the
distal descending thoracic aorta. The esophagus is grossly normal.

Hepatobiliary: No focal hepatic lesions are identified. Status post
cholecystectomy with stable mild intrahepatic and extrahepatic
biliary dilatation.

Pancreas: No mass, inflammation or ductal dilatation.

Spleen: Normal size.  No focal lesions.

Adrenals/Urinary Tract: The adrenal glands are normal in stable.

Very small kidneys but no hydronephrosis. Stable small enhancing
right renal lesions. E largest lesion measures 11 mm. Small
scattered cysts.

Stomach/Bowel: The stomach, duodenum, small bowel and colon are
grossly normal without oral contrast. Scattered colonic
diverticulosis but no findings for acute diverticulitis. Mild rectal
wall thickening and enhancement could suggest proctitis.

Vascular/Lymphatic: Age advanced atherosclerotic calcifications
involving the abdominal aorta and branch vessels including dense
calcification of the SMA and IMA. No definite CT findings for
ischemic bowel.

Small scattered mesenteric and retroperitoneal lymph nodes but no
mass or overt adenopathy.

Reproductive: Status post hysterectomy.

Other: No pelvic mass or adenopathy. No free pelvic fluid
collections. No inguinal mass or adenopathy. No abdominal wall
hernia or subcutaneous lesions.

Musculoskeletal: No significant bony findings.
IMPRESSION: 1. Small right pleural effusion and bibasilar atelectasis.
2. Status post cholecystectomy with mild stable intra and
extrahepatic biliary dilatation.
3. Stable small kidneys.  Patient is on dialysis.
4. Stable small enhancing right renal lesions.
5. Mild rectal wall thickening could suggest proctitis.
6. Stable advanced atherosclerotic calcifications involving the
aorta and branch vessels most notably the SMA and IMA.

## 2017-10-15 MED ORDER — MIDAZOLAM HCL 5 MG/5ML IJ SOLN: 2.0000 mg | Freq: Once | INTRAMUSCULAR | Status: DC

## 2017-10-15 MED ORDER — SODIUM CHLORIDE 0.9 % IV BOLUS (SEPSIS): 250.0000 mL | Freq: Once | INTRAVENOUS | Status: DC

## 2017-10-15 MED ORDER — CYCLOBENZAPRINE HCL 10 MG PO TABS
10.0000 mg | ORAL_TABLET | Freq: Once | ORAL | Status: AC
  Administered 2017-10-15: 10 mg via ORAL
  Filled 2017-10-15: qty 1

## 2017-10-15 MED ORDER — MIDAZOLAM HCL 5 MG/5ML IJ SOLN
2.0000 mg | Freq: Once | INTRAMUSCULAR | Status: AC
Start: 2017-10-15 — End: 2017-10-15
  Administered 2017-10-15: 2 mg via INTRAMUSCULAR

## 2017-10-15 MED ORDER — CYCLOBENZAPRINE HCL 5 MG PO TABS: 5.0000 mg | ORAL_TABLET | Freq: Three times a day (TID) | ORAL | 0 refills | Status: DC | PRN

## 2017-10-15 MED ORDER — MIDAZOLAM HCL 5 MG/5ML IJ SOLN
INTRAMUSCULAR | Status: AC
  Filled 2017-10-15: qty 5

## 2017-10-15 NOTE — ED Notes (Addendum)
Pt. Verbalizes understanding of d/c instructions, medications, and follow-up. VS stable and pain controlled per pt.  Pt. In NAD at time of d/c and denies further concerns regarding this visit. Pt. Stable at the time of departure from the unit, departing unit by the safest and most appropriate manner per that pt condition and limitations with all belongings accounted for. Pt advised to return to the ED at any time for emergent concerns, or for new/worsening symptoms.   Per charge, pt to be d/c to lobby in wheelchair to wait for ride by family.

## 2017-10-15 NOTE — ED Notes (Signed)
Pt given water. Pt states no family to pick her up, came via ems, walks with a cane and unable to take bus. This RN to call contacts in chart

## 2017-10-15 NOTE — ED Provider Notes (Signed)
[facility] Emergency Department Provider Note    First MD Initiated Contact with Patient 10/15/17 1459     (approximate)  I have reviewed the triage vital signs and the nursing notes.   HISTORY  Chief Complaint Chest Pain    HPI [name] is a 57 y.o. female with extensive chronic past medical history with frequent visits to the ER presents with chief complaint of severe sudden onset left leg and calf pain as well as pain in her left side of her chest radiating through to her back.  Patient screaming out in pain.  States that she is unable to move her on flex her left leg.  States that the pain started in the left leg and started feeling anxious and short of breath after that.  Has never had symptoms like this before.  She is on dialysis with last dialysis session yesterday.  Past Medical History:  Diagnosis Date  . Anemia   . Anginal pain ([place])   . Anxiety   . Arthritis   . Asthma   . Broken wrist   . Bronchitis   . chronic diastolic CHF 7/67/3419  . COPD (chronic obstructive pulmonary disease) ([place])   . Coronary artery disease    a. cath 2013: stenting to RCA (report not available); b. cath 2014: LM nl, pLAD 40%, mLAD nl, ost LCx 40%, mid LCx nl, pRCA 30% @ site of prior stent, mRCA 50%  . Depression   . Diabetes mellitus without complication ([place])   . Diabetic neuropathy ([place])   . dialysis 2006  . Diverticulosis   . Dizziness   . Dyspnea   . Elevated lipids   . Environmental and seasonal allergies   . ESRD (end stage renal disease) on dialysis ([place])    M-W-F  . GERD (gastroesophageal reflux disease)   . Headache   . History of hiatal hernia   . HOH (hard of hearing)   . Hx of pancreatitis 2015  . Hypertension   . Lower extremity edema   . Mitral regurgitation    a. echo 10/2013: EF 62%, noWMA, mildly dilated LA, mild to mod MR/TR, GR1DD  . Myocardial infarction ([place])   . Orthopnea   . Pneumonia   . Renal cancer ([place])   . Renal  insufficiency    Pt is on dialysis on M,W + F.  . Wheezing    Family History  Problem Relation Age of Onset  . Kidney disease Mother   . Diabetes Mother   . Cancer Father   . Kidney disease Sister    Past Surgical History:  Procedure Laterality Date  . ABDOMINAL HYSTERECTOMY  1992  . AMPUTATION TOE Left 10/02/2017   Procedure: AMPUTATION TOE-LEFT GREAT TOE;  Surgeon: Albertine Patricia, DPM;  Location: [facility] ORS;  Service: Podiatry;  Laterality: Left;  . APPENDECTOMY    . CARDIAC CATHETERIZATION Left 07/26/2015   Procedure: Left Heart Cath and Coronary Angiography;  Surgeon: Dionisio David, MD;  Location: [facility];  Service: Cardiovascular;  Laterality: Left;  . CATARACT EXTRACTION W/ INTRAOCULAR LENS IMPLANT Right   . CATARACT EXTRACTION W/PHACO Left 03/10/2017   Procedure: CATARACT EXTRACTION PHACO AND INTRAOCULAR LENS PLACEMENT (IOC);  Surgeon: Birder Robson, MD;  Location: [facility] ORS;  Service: Ophthalmology;  Laterality: Left;  [place] 00:51.9 AP% 14.2 CDE 7.39 fluid pack lot # [id number]  . CHOLECYSTECTOMY    . COLONOSCOPY WITH PROPOFOL N/A 08/12/2016   Procedure: COLONOSCOPY WITH PROPOFOL;  Surgeon:  Lollie Sails, MD;  Location[facility] ENDOSCOPY;  Service: Endoscopy;  Laterality: N/A;  . DIALYSIS FISTULA CREATION Left    upper arm  . dialysis grafts    . ESOPHAGOGASTRODUODENOSCOPY N/A 03/08/2015   Procedure: ESOPHAGOGASTRODUODENOSCOPY (EGD);  Surgeon: Manya Silvas, MD;  Location[facility] ENDOSCOPY;  Service: Endoscopy;  Laterality: N/A;  . ESOPHAGOGASTRODUODENOSCOPY (EGD) WITH PROPOFOL N/A 03/18/2016   Procedure: ESOPHAGOGASTRODUODENOSCOPY (EGD) WITH PROPOFOL;  Surgeon: [name], MD;  Location: [facility] ENDOSCOPY;  Service: Endoscopy;  Laterality: N/A;  . EYE SURGERY Right 2018  . FECAL TRANSPLANT N/A 08/23/2015   Procedure: FECAL TRANSPLANT;  Surgeon: Manya Silvas, MD;  Location[facility];  Service: Endoscopy;  Laterality: N/A;  . HAND SURGERY Bilateral   .  PERIPHERAL VASCULAR CATHETERIZATION N/A 12/20/2015   Procedure: Thrombectomy of dialysis access versus permcath placement;  Surgeon: Algernon Huxley, MD;  Location: [facility];  Service: Cardiovascular;  Laterality: N/A;  . PERIPHERAL VASCULAR CATHETERIZATION N/A 12/20/2015   Procedure: A/V Shunt Intervention;  Surgeon: Algernon Huxley, MD;  Location: [facility];  Service: Cardiovascular;  Laterality: N/A;  . PERIPHERAL VASCULAR CATHETERIZATION N/A 12/20/2015   Procedure: A/V Shuntogram/Fistulagram;  Surgeon: Algernon Huxley, MD;  Location: [facility] [facility];  Service: Cardiovascular;  Laterality: N/A;  . PERIPHERAL VASCULAR CATHETERIZATION N/A 01/02/2016   Procedure: A/V Shuntogram/Fistulagram;  Surgeon: Algernon Huxley, MD;  Location: [place] [facility];  Service: Cardiovascular;  Laterality: N/A;  . PERIPHERAL VASCULAR CATHETERIZATION N/A 01/02/2016   Procedure: A/V Shunt Intervention;  Surgeon: Algernon Huxley, MD;  Location: [facility];  Service: Cardiovascular;  Laterality: N/A;   Patient Active Problem List   Diagnosis Date Noted  . Osteomyelitis ([place]) 09/30/2017  . Carotid stenosis 06/18/2017  . Shortness of breath 05/04/2017  . Cellulitis of lower extremity 07/29/2016  . Chronic venous insufficiency 07/29/2016  . Lymphedema 07/29/2016  . TIA (transient ischemic attack) 04/21/2016  . Altered mental status 04/08/2016  . Hyperammonemia ([place]) 04/08/2016  . Elevated troponin 04/08/2016  . Depression 04/08/2016  . Depression, major, recurrent, severe with psychosis ([place]) 04/08/2016  . Blood in stool   . Intractable cyclical vomiting with nausea   . Reflux esophagitis   . Gastritis   . Generalized abdominal pain   . Uncontrollable vomiting   . Major depressive disorder, recurrent episode, moderate ([place]) 03/15/2016  . Adjustment disorder with mixed anxiety and depressed mood 03/15/2016  . Malnutrition of moderate degree 12/01/2015  . Renal mass   . Dyspnea   . Acute  renal failure ([place])   . Respiratory failure ([place])   . High temperature 11/14/2015  . Pulmonary edema   . Encounter for central line placement   . Encounter for orogastric (OG) tube placement   . Nausea 11/12/2015  . Hyperkalemia 10/03/2015  . Diarrhea, unspecified 07/22/2015  . Pneumonia 05/21/2015  . Hypoglycemia 04/24/2015  . Unresponsiveness 04/24/2015  . Bradycardia 04/24/2015  . Hypothermia 04/24/2015  . Acute respiratory failure ([place]) 04/24/2015  . Acute diastolic CHF (congestive heart failure) ([place]) 04/05/2015  . Diabetic gastroparesis ([place]) 04/05/2015  . Hypokalemia 04/05/2015  . Generalized weakness 04/05/2015  . Acute pulmonary edema ([place]) 04/03/2015  . Nausea and vomiting 04/03/2015  . Hypoglycemia associated with diabetes ([place]) 04/03/2015  . Anemia of chronic disease 04/03/2015  . Secondary hyperparathyroidism ([place]) 04/03/2015  . Pressure ulcer 04/02/2015  . Acute respiratory failure with hypoxia ([place]) 04/01/2015  . Adjustment disorder with anxiety 03/14/2015  . Somatic  symptom disorder, mild 03/08/2015  . Coronary artery disease involving native coronary artery of native heart without angina pectoris   . Nausea & vomiting 03/06/2015  . Abdominal pain 03/06/2015  . DM (diabetes mellitus) ([place]) 03/06/2015  . HTN (hypertension) 03/06/2015  . Gastroparesis 02/24/2015  . Pleural effusion 02/19/2015  . HCAP (healthcare-associated pneumonia) 02/19/2015  . End-stage renal disease on hemodialysis ([place]) 02/19/2015      Prior to Admission medications   Medication Sig Start Date End Date Taking? Authorizing Provider  acidophilus (RISAQUAD) CAPS capsule Take 1 capsule by mouth daily.    [provider]  alum & mag hydroxide-simeth (MAALOX MAX) 400-400-40 MG/5ML suspension Take 5 mLs by mouth every 6 (six) hours as needed for indigestion. Patient not taking: Reported on 10/09/2017 06/09/17   Rudene Re, MD  amLODipine (NORVASC) 10 MG tablet Take 10 mg by  mouth daily.    [provider]  aspirin EC 81 MG tablet Take 81 mg by mouth daily. 09/08/16   [provider]  atorvastatin (LIPITOR) 20 MG tablet Take 20 mg by mouth at bedtime.  12/02/16   [provider]  cetirizine (ZYRTEC) 10 MG tablet Take 10 mg by mouth daily.     [provider]  cholecalciferol (VITAMIN D) 400 units TABS tablet Take 400 Units by mouth daily.    [provider]  clopidogrel (PLAVIX) 75 MG tablet Take 1 tablet (75 mg total) by mouth daily. 03/25/17 03/25/18  Dustin Flock, MD  cyanocobalamin 1000 MCG tablet Take 1,000 mcg by mouth daily with lunch.     [provider]  dexlansoprazole (DEXILANT) 60 MG capsule Take 60 mg by mouth daily.    [provider]  doxycycline (VIBRA-TABS) 100 MG tablet Take 1 tablet (100 mg total) by mouth every 12 (twelve) hours. Patient not taking: Reported on 10/09/2017 10/04/2017   Salary, Holly Bodily D, MD  famotidine (PEPCID) 20 MG tablet Take 1 tablet (20 mg total) by mouth 2 (two) times daily. Patient not taking: Reported on 09/30/2017 06/09/17 06/09/18  Rudene Re, MD  FLUoxetine (PROZAC) 20 MG capsule Take 1 capsule (20 mg total) by mouth daily. 04/10/16   Hower, Aaron Mose, MD  glucose 4 GM chewable tablet Chew 3-4 tablets by mouth as needed for low blood sugar (blood sugar less than 60).     [provider]  HYDROcodone-acetaminophen (NORCO/VICODIN) 5-325 MG tablet Take 1 tablet by mouth every 6 (six) hours as needed for moderate pain or severe pain. Patient taking differently: Take 1 tablet by mouth 3 (three) times daily as needed for moderate pain or severe pain.  05/05/17   Gouru, Illene Silver, MD  insulin detemir (LEVEMIR) 100 UNIT/ML injection Inject 0.02 mLs (2 Units total) into the skin 2 (two) times daily. Patient taking differently: Inject 4 Units into the skin at bedtime.  09/05/17   Epifanio Lesches, MD  lipase/protease/amylase (CREON) 12000 UNITS CPEP capsule Take  24,000-36,000 Units by mouth 3 (three) times daily with meals. Take 36000 mg in the morning, 24000 midday & 36000 in the evening    [provider]  metoCLOPramide (REGLAN) 10 MG tablet Take 1 tablet (10 mg total) by mouth every 8 (eight) hours as needed for nausea or vomiting. Patient not taking: Reported on 10/07/2017 06/08/17   Earleen Newport, MD  multivitamin (RENA-VIT) TABS tablet Take 1 tablet by mouth daily.     [provider]  naloxone [facility]) 0.4 MG/ML injection Inject 0.4 mg into the vein as  needed.    [provider]  nitroGLYCERIN (NITROSTAT) 0.4 MG SL tablet Place 0.4 mg under the tongue every 5 (five) minutes x 3 doses as needed.    [provider]  OLANZapine (ZYPREXA) 5 MG tablet Take 5 mg by mouth at bedtime.    [provider]  omega-3 acid ethyl esters (LOVAZA) 1 g capsule Take 1 g by mouth daily.     [provider]  promethazine (PHENERGAN) 25 MG suppository Place 1 suppository (25 mg total) rectally every 6 (six) hours as needed for nausea. 10/09/17 10/09/18  Harvest Dark, MD  promethazine (PHENERGAN) 25 MG tablet Take 0.5 tablets (12.5 mg total) by mouth every 6 (six) hours as needed for nausea or vomiting. 10/09/17   Harvest Dark, MD  ranitidine (ZANTAC) 300 MG tablet Take 300 mg by mouth at bedtime. In the afternoon    [provider]  sevelamer carbonate (RENVELA) 800 MG tablet Take 800 mg by mouth 3 (three) times daily with meals.    [provider]    Allergies Ace inhibitors; Ativan [lorazepam]; Compazine [prochlorperazine edisylate]; Sumatriptan succinate; Dilaudid [hydromorphone hcl]; Ondansetron; Zofran [ondansetron hcl]; Codeine; Gabapentin; [place]; Losartan; Oxycodone; Prochlorperazine; Reglan [metoclopramide]; Scopolamine; Tape; and Tapentadol    Social History Social History   Tobacco Use  . Smoking status: Former Smoker    Packs/day: 0.50    Years: 40.00    Pack  years: 20.00    Types: Cigarettes    Last attempt to quit: 02/13/2015    Years since quitting: 2.6  . Smokeless tobacco: Never Used  Substance Use Topics  . Alcohol use: Yes    Alcohol/week: 0.6 oz    Types: 1 Glasses of wine per week    Comment: glass wine week per pt  . Drug use: Yes    Types: Marijuana    Comment: once a day    Review of Systems Patient denies headaches, rhinorrhea, blurry vision, numbness, shortness of breath, chest pain, edema, cough, abdominal pain, nausea, vomiting, diarrhea, dysuria, fevers, rashes or hallucinations unless otherwise stated above in HPI. ____________________________________________   PHYSICAL EXAM:  VITAL SIGNS: Vitals:   10/15/17 1346 10/15/17 1430  BP: (!) 184/63 (!) 179/83  Pulse: 61   Resp: 20 (!) 22  Temp: 97.9 F (36.6 C)   SpO2: 100%     Constitutional: Alert and oriented. Yelling in pain but consolable Eyes: Conjunctivae are normal.  Head: Atraumatic. Nose: No congestion/rhinnorhea. Mouth/Throat: Mucous membranes are moist.   Neck: No stridor. Painless ROM.  Cardiovascular: Normal rate, regular rhythm. Grossly normal heart sounds.  Good peripheral circulation. Respiratory: Normal respiratory effort.  No retractions. Lungs CTAB. Gastrointestinal: Soft and nontender. No distention. No abdominal bruits. No CVA tenderness. Musculoskeletal: Left calf muscles in spasm with left foot held in plantarflexion.  Anterior compartment soft.  No erythema or warmth.  Palpable DP and PT pulses bilaterally with triphasic Doppler signals. Neurologic:  Normal speech and language. No gross focal neurologic deficits are appreciated. No facial droop Skin:  Skin is warm, dry and intact. No rash noted. Psychiatric:very anxious appearing, tearful  ____________________________________________   LABS (all labs ordered are listed, but only abnormal results are displayed)  Results for orders placed or performed during the hospital encounter of  10/15/17 (from the past 24 hour(s))  Basic metabolic panel     Status: Abnormal   Collection Time: 10/15/17  1:44 PM  Result Value Ref Range   Sodium 136 135 - 145 mmol/L  Potassium 3.8 3.5 - 5.1 mmol/L   Chloride 96 (L) 101 - 111 mmol/L   CO2 27 22 - 32 mmol/L   Glucose, Bld 91 65 - 99 mg/dL   BUN 9 6 - 20 mg/dL   Creatinine, Ser 4.57 (H) 0.44 - 1.00 mg/dL   Calcium 8.5 (L) 8.9 - 10.3 mg/dL   GFR calc non Af Amer 10 (L) >60 mL/min   GFR calc Af Amer 11 (L) >60 mL/min   Anion gap 13 5 - 15  CBC     Status: Abnormal   Collection Time: 10/15/17  1:44 PM  Result Value Ref Range   WBC 5.9 3.6 - 11.0 K/uL   RBC 3.40 (L) 3.80 - 5.20 MIL/uL   Hemoglobin 10.3 (L) 12.0 - 16.0 g/dL   HCT 31.9 (L) 35.0 - 47.0 %   MCV 93.7 80.0 - 100.0 fL   MCH 30.4 26.0 - 34.0 pg   MCHC 32.4 32.0 - 36.0 g/dL   RDW 17.7 (H) 11.5 - 14.5 %   Platelets 253 150 - 440 K/uL  Troponin I     Status: None   Collection Time: 10/15/17  1:44 PM  Result Value Ref Range   Troponin I <0.03 <0.03 ng/mL   ____________________________________________  EKG My review and personal interpretation at Time: 13:30   Indication: chest pain  Rate: 65  Rhythm: sinus Axis: normal Other: normal intervals, no stemi, borderline prolonged qt ____________________________________________  RADIOLOGY  I personally reviewed all radiographic images ordered to evaluate for the above acute complaints and reviewed radiology reports and findings.  These findings were personally discussed with the patient.  Please see medical record for radiology report.  ____________________________________________   PROCEDURES  Procedure(s) performed:  Procedures    Critical Care performed: no ____________________________________________   INITIAL IMPRESSION / ASSESSMENT AND PLAN / ED COURSE  Pertinent labs & imaging results that were available during my care of the patient were reviewed by me and considered in my medical decision making (see  chart for details).  DDX: Musculoskeletal spasm electrolyte abnormality, dehydration, dissection, limb ischemia, soft tissue infection, fracture, ACS  [name] is a 57 y.o. who presents to the ED with symptoms as described above.  Patient very uncomfortable appearing very clearly is having a muscle spasm in the left lower extreme the calf muscles because the patient's discomfort.  Will give IM Versed as well as Flexeril and reassess.  Clinical Course as of Oct 15 1633  Thu Oct 15, 2017  1615 Patient reassessed.  States that pain has resolved.  Palpation of the left calf muscle is now soft no longer cramping.  Resting comfortably.  Hemodynamic is stable.  [PR]  [id number] This not clinically consistent with dissection or limb ischemia.  She has good perfusion in bilateral lower extremities.  Her abdominal.  Patient tolerating oral hydration.  No indication for emergent dialysis.  This point do believe patient is stable and appropriate for  follow up with PCP.  Have discussed with the patient and available family all diagnostics and treatments performed thus far and all questions were answered to the best of my ability. The patient demonstrates understanding and agreement with plan.   [PR]    Clinical Course User Index [PR] Merlyn Lot, MD     ____________________________________________   FINAL CLINICAL IMPRESSION(S) / ED DIAGNOSES  Final diagnoses:  Muscle cramping  Left leg pain      NEW MEDICATIONS STARTED DURING THIS VISIT:  New Prescriptions  No medications on file     Note:  This document was prepared using Dragon voice recognition software and may include unintentional dictation errors.    Merlyn Lot, MD 10/15/17 [id number]

## 2017-10-15 NOTE — ED Notes (Signed)
Patient is resting comfortably at this time with no signs of distress present. VS stable. Will continue to monitor.   

## 2017-10-15 NOTE — ED Notes (Signed)
X-ray at bedside

## 2017-10-15 NOTE — ED Triage Notes (Addendum)
Pt comes into the ED via POV c/o left side chest pain that radiates into the back and associated with N/V and shortness of breath.  Patient found to be screaming in the lobby while waiting to be triaged.  Patient has even and unlabored respirations at this time and continually hold her breath until asked to stop.  Patient brought from home.  Denies any dizziness, abdominal pain, or headaches.  Patient has h/o 2 MI's in the past but was unable to answer whether or not this one feels similar.  Patient is a current dialysis pateint with her last treatment being yesterday.  Patient unsure of how much they were able to pull off but states she did completed the treatment.

## 2017-10-15 NOTE — ED Notes (Signed)
This RN contacted pt son who is at work but will attempt to find family that can transport patient home.

## 2017-10-15 NOTE — ED Notes (Addendum)
While Patient in Xray, patient had a pre syncopal / anxiety event.  Dr. Jacqualine Code and witer responded to Xray room 5.  Patient was awake and alert.  Hyperventilating.  Skin warm and dry.  Patient c/o chest pain.  Patient placed in stretcher and then brought to eD room 8.  Cardiac monitoring established.  Patient remains AAOx3.  Anxious and emotional.  Discussed immediate plan of care.  Emotional reassurance given.

## 2017-10-19 ENCOUNTER — Emergency Department
Admission: EM | Admit: 2017-10-19 | Discharge: 2017-10-19 | Disposition: A | Discharge status: Home / Self Care | Payer: Medicare Other | Attending: Emergency Medicine | Admitting: Emergency Medicine

## 2017-10-19 ENCOUNTER — Encounter: Payer: Self-pay | Admitting: Emergency Medicine

## 2017-10-19 DIAGNOSIS — N186 End stage renal disease: Secondary | ICD-10-CM | POA: Insufficient documentation

## 2017-10-19 DIAGNOSIS — Z794 Long term (current) use of insulin: Secondary | ICD-10-CM | POA: Diagnosis not present

## 2017-10-19 DIAGNOSIS — I5032 Chronic diastolic (congestive) heart failure: Secondary | ICD-10-CM | POA: Diagnosis not present

## 2017-10-19 DIAGNOSIS — E119 Type 2 diabetes mellitus without complications: Secondary | ICD-10-CM | POA: Insufficient documentation

## 2017-10-19 DIAGNOSIS — I132 Hypertensive heart and chronic kidney disease with heart failure and with stage 5 chronic kidney disease, or end stage renal disease: Secondary | ICD-10-CM | POA: Insufficient documentation

## 2017-10-19 DIAGNOSIS — Z87891 Personal history of nicotine dependence: Secondary | ICD-10-CM | POA: Insufficient documentation

## 2017-10-19 DIAGNOSIS — Z7982 Long term (current) use of aspirin: Secondary | ICD-10-CM | POA: Insufficient documentation

## 2017-10-19 DIAGNOSIS — J449 Chronic obstructive pulmonary disease, unspecified: Secondary | ICD-10-CM | POA: Insufficient documentation

## 2017-10-19 DIAGNOSIS — Z85528 Personal history of other malignant neoplasm of kidney: Secondary | ICD-10-CM | POA: Insufficient documentation

## 2017-10-19 DIAGNOSIS — Z79899 Other long term (current) drug therapy: Secondary | ICD-10-CM | POA: Diagnosis not present

## 2017-10-19 DIAGNOSIS — I2510 Atherosclerotic heart disease of native coronary artery without angina pectoris: Secondary | ICD-10-CM | POA: Diagnosis not present

## 2017-10-19 DIAGNOSIS — R1013 Epigastric pain: Secondary | ICD-10-CM | POA: Insufficient documentation

## 2017-10-19 LAB — COMPREHENSIVE METABOLIC PANEL
ALT: 11 U/L — ABNORMAL LOW (ref 14–54)
AST: 25 U/L (ref 15–41)
Albumin: 3.6 g/dL (ref 3.5–5.0)
Alkaline Phosphatase: 107 U/L (ref 38–126)
Anion gap: 15 (ref 5–15)
BUN: 13 mg/dL (ref 6–20)
CO2: 25 mmol/L (ref 22–32)
Calcium: 8.2 mg/dL — ABNORMAL LOW (ref 8.9–10.3)
Chloride: 98 mmol/L — ABNORMAL LOW (ref 101–111)
Creatinine, Ser: 4.35 mg/dL — ABNORMAL HIGH (ref 0.44–1.00)
GFR calc Af Amer: 12 mL/min — ABNORMAL LOW (ref >60)
GFR calc non Af Amer: 10 mL/min — ABNORMAL LOW (ref >60)
Glucose, Bld: 101 mg/dL — ABNORMAL HIGH (ref 65–99)
Potassium: 3.2 mmol/L — ABNORMAL LOW (ref 3.5–5.1)
Sodium: 138 mmol/L (ref 135–145)
Total Bilirubin: 0.8 mg/dL (ref 0.3–1.2)
Total Protein: 7.9 g/dL (ref 6.5–8.1)

## 2017-10-19 LAB — GLUCOSE, CAPILLARY: Glucose-Capillary: 70 mg/dL (ref 65–99)

## 2017-10-19 LAB — CBC
HCT: 34.2 % — ABNORMAL LOW (ref 35.0–47.0)
Hemoglobin: 11 g/dL — ABNORMAL LOW (ref 12.0–16.0)
MCH: 29.8 pg (ref 26.0–34.0)
MCHC: 32.2 g/dL (ref 32.0–36.0)
MCV: 92.5 fL (ref 80.0–100.0)
Platelets: 234 10*3/uL (ref 150–440)
RBC: 3.7 MIL/uL — ABNORMAL LOW (ref 3.80–5.20)
RDW: 18.2 % — ABNORMAL HIGH (ref 11.5–14.5)
WBC: 4.4 10*3/uL (ref 3.6–11.0)

## 2017-10-19 LAB — LIPASE, BLOOD: Lipase: 21 U/L (ref 11–51)

## 2017-10-19 MED ORDER — HALOPERIDOL LACTATE 5 MG/ML IJ SOLN
INTRAMUSCULAR | Status: AC
  Filled 2017-10-19: qty 1

## 2017-10-19 MED ORDER — HALOPERIDOL LACTATE 5 MG/ML IJ SOLN
5.0000 mg | Freq: Once | INTRAMUSCULAR | Status: AC
  Administered 2017-10-19: 5 mg via INTRAMUSCULAR
  Filled 2017-10-19: qty 1

## 2017-10-19 NOTE — ED Triage Notes (Signed)
Pt in via [facility] from dialysis, pt states, "they pulled too much fluid off of me today, causing me to have a bad headache and to get sick on my stomach."  Pt moaning, groaning in triage.  Vitals WDL.

## 2017-10-19 NOTE — ED Provider Notes (Addendum)
[facility] Emergency Department Provider Note  ____________________________________________   I have reviewed the triage vital signs and the nursing notes. Where available I have reviewed prior notes and, if possible and indicated, outside hospital notes.    HISTORY  Chief Complaint Nausea and Abdominal Pain    HPI [name] is a 57 y.o. female well-known to [place] for multiple prior visits to the emergency department for abdominal pain and multiple other cramping complaints after dialysis presents today with abdominal pain and nausea after dialysis.  She denies any chest pain or shortness of breath.  States this is the pain she always gets when they are too aggressive with her dialysis.  She denies any fever or chills.  She states that she needs "Dilaudid and Phenergan" and that is the only thing that will make her better.  She has epigastric discomfort.  This is consistent when she states with her gastroparesis.  Patient, when specifically asked states that she is out of her Vicodin and has been out of her Vicodin for most of this month she is transitioning her care to another physician, and when she can get her home Vicodin, it appears that her pain gets worse she states.  He did not want to tell me why she was on chronic Vicodin, she states is for "pain".    Past Medical History:  Diagnosis Date  . Anemia   . Anginal pain ([place])   . Anxiety   . Arthritis   . Asthma   . Broken wrist   . Bronchitis   . chronic diastolic CHF 4/54/0981  . COPD (chronic obstructive pulmonary disease) ([place])   . Coronary artery disease    a. cath 2013: stenting to RCA (report not available); b. cath 2014: LM nl, pLAD 40%, mLAD nl, ost LCx 40%, mid LCx nl, pRCA 30% @ site of prior stent, mRCA 50%  . Depression   . Diabetes mellitus without complication ([place])   . Diabetic neuropathy ([place])   . dialysis 2006  . Diverticulosis   . Dizziness   . Dyspnea   . Elevated lipids   .  Environmental and seasonal allergies   . ESRD (end stage renal disease) on dialysis ([place])    M-W-F  . GERD (gastroesophageal reflux disease)   . Headache   . History of hiatal hernia   . HOH (hard of hearing)   . Hx of pancreatitis 2015  . Hypertension   . Lower extremity edema   . Mitral regurgitation    a. echo 10/2013: EF 62%, noWMA, mildly dilated LA, mild to mod MR/TR, GR1DD  . Myocardial infarction ([place])   . Orthopnea   . Pneumonia   . Renal cancer ([place])   . Renal insufficiency    Pt is on dialysis on M,W + F.  . Wheezing     Patient Active Problem List   Diagnosis Date Noted  . Osteomyelitis ([place]) 09/30/2017  . Carotid stenosis 06/18/2017  . Shortness of breath 05/04/2017  . Cellulitis of lower extremity 07/29/2016  . Chronic venous insufficiency 07/29/2016  . Lymphedema 07/29/2016  . TIA (transient ischemic attack) 04/21/2016  . Altered mental status 04/08/2016  . Hyperammonemia ([place]) 04/08/2016  . Elevated troponin 04/08/2016  . Depression 04/08/2016  . Depression, major, recurrent, severe with psychosis ([place]) 04/08/2016  . Blood in stool   . Intractable cyclical vomiting with nausea   . Reflux esophagitis   . Gastritis   . Generalized abdominal pain   .  Uncontrollable vomiting   . Major depressive disorder, recurrent episode, moderate ([place]) 03/15/2016  . Adjustment disorder with mixed anxiety and depressed mood 03/15/2016  . Malnutrition of moderate degree 12/01/2015  . Renal mass   . Dyspnea   . Acute renal failure ([place])   . Respiratory failure ([place])   . High temperature 11/14/2015  . Pulmonary edema   . Encounter for central line placement   . Encounter for orogastric (OG) tube placement   . Nausea 11/12/2015  . Hyperkalemia 10/03/2015  . Diarrhea, unspecified 07/22/2015  . Pneumonia 05/21/2015  . Hypoglycemia 04/24/2015  . Unresponsiveness 04/24/2015  . Bradycardia 04/24/2015  . Hypothermia 04/24/2015  . Acute respiratory failure ([place])  04/24/2015  . Acute diastolic CHF (congestive heart failure) ([place]) 04/05/2015  . Diabetic gastroparesis ([place]) 04/05/2015  . Hypokalemia 04/05/2015  . Generalized weakness 04/05/2015  . Acute pulmonary edema ([place]) 04/03/2015  . Nausea and vomiting 04/03/2015  . Hypoglycemia associated with diabetes ([place]) 04/03/2015  . Anemia of chronic disease 04/03/2015  . Secondary hyperparathyroidism ([place]) 04/03/2015  . Pressure ulcer 04/02/2015  . Acute respiratory failure with hypoxia ([place]) 04/01/2015  . Adjustment disorder with anxiety 03/14/2015  . Somatic symptom disorder, mild 03/08/2015  . Coronary artery disease involving native coronary artery of native heart without angina pectoris   . Nausea & vomiting 03/06/2015  . Abdominal pain 03/06/2015  . DM (diabetes mellitus) ([place]) 03/06/2015  . HTN (hypertension) 03/06/2015  . Gastroparesis 02/24/2015  . Pleural effusion 02/19/2015  . HCAP (healthcare-associated pneumonia) 02/19/2015  . End-stage renal disease on hemodialysis ([place]) 02/19/2015    Past Surgical History:  Procedure Laterality Date  . ABDOMINAL HYSTERECTOMY  1992  . AMPUTATION TOE Left 10/02/2017   Procedure: AMPUTATION TOE-LEFT GREAT TOE;  Surgeon: Albertine Patricia, DPM;  Location: [facility] ORS;  Service: Podiatry;  Laterality: Left;  . APPENDECTOMY    . CARDIAC CATHETERIZATION Left 07/26/2015   Procedure: Left Heart Cath and Coronary Angiography;  Surgeon: Dionisio David, MD;  Location: [place] [facility];  Service: Cardiovascular;  Laterality: Left;  . CATARACT EXTRACTION W/ INTRAOCULAR LENS IMPLANT Right   . CATARACT EXTRACTION W/PHACO Left 03/10/2017   Procedure: CATARACT EXTRACTION PHACO AND INTRAOCULAR LENS PLACEMENT (IOC);  Surgeon: Birder Robson, MD;  Location: [facility] ORS;  Service: Ophthalmology;  Laterality: Left;  [place] 00:51.9 AP% 14.2 CDE 7.39 fluid pack lot # [id number]  . CHOLECYSTECTOMY    . COLONOSCOPY WITH PROPOFOL N/A 08/12/2016   Procedure: COLONOSCOPY WITH  PROPOFOL;  Surgeon: Lollie Sails, MD;  Location[facility] ENDOSCOPY;  Service: Endoscopy;  Laterality: N/A;  . DIALYSIS FISTULA CREATION Left    upper arm  . dialysis grafts    . ESOPHAGOGASTRODUODENOSCOPY N/A 03/08/2015   Procedure: ESOPHAGOGASTRODUODENOSCOPY (EGD);  Surgeon: Manya Silvas, MD;  Location[facility] END[facility];  Service: Endoscopy;  Laterality: N/A;  . ESOPHAGOGASTRODUODENOSCOPY (EGD) WITH PROPOFOL N/A 03/18/2016   Procedure: ESOPHAGOGASTRODUODENOSCOPY (EGD) WITH PROPOFOL;  Surgeon: [name], MD;  Location: [facility] ENDOSCOPY;  Service: Endoscopy;  Laterality: N/A;  . EYE SURGERY Right 2018  . FECAL TRANSPLANT N/A 08/23/2015   Procedure: FECAL TRANSPLANT;  Surgeon: Manya Silvas, MD;  Location[facility] ENDOSCOPY;  Service: Endoscopy;  Laterality: N/A;  . HAND SURGERY Bilateral   . PERIPHERAL VASCULAR CATHETERIZATION N/A 12/20/2015   Procedure: Thrombectomy of dialysis access versus permcath placement;  Surgeon: Algernon Huxley, MD;  Location: [place] [facility];  Service: Cardiovascular;  Laterality: N/A;  . PERIPHERAL VASCULAR CATHETERIZATION N/A 12/20/2015   Procedure:  A/V Shunt Intervention;  Surgeon: Algernon Huxley, MD;  Location: [place] [facility];  Service: Cardiovascular;  Laterality: N/A;  . PERIPHERAL VASCULAR CATHETERIZATION N/A 12/20/2015   Procedure: A/V Shuntogram/Fistulagram;  Surgeon: Algernon Huxley, MD;  Location: [place] [facility] LAB;  Service: Cardiovascular;  Laterality: N/A;  . PERIPHERAL VASCULAR CATHETERIZATION N/A 01/02/2016   Procedure: A/V Shuntogram/Fistulagram;  Surgeon: Algernon Huxley, MD;  Location: [place] CV LAB;  Service: Cardiovascular;  Laterality: N/A;  . PERIPHERAL VASCULAR CATHETERIZATION N/A 01/02/2016   Procedure: A/V Shunt Intervention;  Surgeon: Algernon Huxley, MD;  Location: [place] [facility] LAB;  Service: Cardiovascular;  Laterality: N/A;    Prior to Admission medications   Medication Sig Start Date End Date Taking? Authorizing Provider   acidophilus (RISAQUAD) CAPS capsule Take 1 capsule by mouth daily.    [provider]  alum & mag hydroxide-simeth (MAALOX MAX) 400-400-40 MG/5ML suspension Take 5 mLs by mouth every 6 (six) hours as needed for indigestion. Patient not taking: Reported on 10/09/2017 06/09/17   Rudene Re, MD  amLODipine (NORVASC) 10 MG tablet Take 10 mg by mouth daily.    [provider]  aspirin EC 81 MG tablet Take 81 mg by mouth daily. 09/08/16   [provider]  atorvastatin (LIPITOR) 20 MG tablet Take 20 mg by mouth at bedtime.  12/02/16   [provider]  cetirizine (ZYRTEC) 10 MG tablet Take 10 mg by mouth daily.     [provider]  cholecalciferol (VITAMIN D) 400 units TABS tablet Take 400 Units by mouth daily.    [provider]  clopidogrel (PLAVIX) 75 MG tablet Take 1 tablet (75 mg total) by mouth daily. 03/25/17 03/25/18  Dustin Flock, MD  cyanocobalamin 1000 MCG tablet Take 1,000 mcg by mouth daily with lunch.     [provider]  cyclobenzaprine (FLEXERIL) 5 MG tablet Take 1 tablet (5 mg total) by mouth 3 (three) times daily as needed for muscle spasms. 10/15/17   Merlyn Lot, MD  dexlansoprazole (DEXILANT) 60 MG capsule Take 60 mg by mouth daily.    [provider]  doxycycline (VIBRA-TABS) 100 MG tablet Take 1 tablet (100 mg total) by mouth every 12 (twelve) hours. Patient not taking: Reported on 10/09/2017 09/23/2017   Salary, Holly Bodily D, MD  famotidine (PEPCID) 20 MG tablet Take 1 tablet (20 mg total) by mouth 2 (two) times daily. Patient not taking: Reported on 09/30/2017 06/09/17 06/09/18  Rudene Re, MD  FLUoxetine (PROZAC) 20 MG capsule Take 1 capsule (20 mg total) by mouth daily. 04/10/16   Hower, Aaron Mose, MD  glucose 4 GM chewable tablet Chew 3-4 tablets by mouth as needed for low blood sugar (blood sugar less than 60).     [provider]  HYDROcodone-acetaminophen (NORCO/VICODIN) 5-325 MG tablet Take  1 tablet by mouth every 6 (six) hours as needed for moderate pain or severe pain. Patient taking differently: Take 1 tablet by mouth 3 (three) times daily as needed for moderate pain or severe pain.  05/05/17   Gouru, Illene Silver, MD  insulin detemir (LEVEMIR) 100 UNIT/ML injection Inject 0.02 mLs (2 Units total) into the skin 2 (two) times daily. Patient taking differently: Inject 4 Units into the skin at bedtime.  09/05/17   Epifanio Lesches, MD  lipase/protease/amylase (CREON) 12000 UNITS CPEP capsule Take 24,000-36,000 Units by mouth 3 (three) times daily with meals. Take 36000 mg in the morning, 24000 midday & 36000 in the evening  [provider]  metoCLOPramide (REGLAN) 10 MG tablet Take 1 tablet (10 mg total) by mouth every 8 (eight) hours as needed for nausea or vomiting. Patient not taking: Reported on 10/07/2017 06/08/17   Earleen Newport, MD  multivitamin (RENA-VIT) TABS tablet Take 1 tablet by mouth daily.     [provider]  naloxone [facility]) 0.4 MG/ML injection Inject 0.4 mg into the vein as needed.    [provider]  nitroGLYCERIN (NITROSTAT) 0.4 MG SL tablet Place 0.4 mg under the tongue every 5 (five) minutes x 3 doses as needed.    [provider]  OLANZapine (ZYPREXA) 5 MG tablet Take 5 mg by mouth at bedtime.    [provider]  omega-3 acid ethyl esters (LOVAZA) 1 g capsule Take 1 g by mouth daily.     [provider]  promethazine (PHENERGAN) 25 MG suppository Place 1 suppository (25 mg total) rectally every 6 (six) hours as needed for nausea. 10/09/17 10/09/18  Harvest Dark, MD  promethazine (PHENERGAN) 25 MG tablet Take 0.5 tablets (12.5 mg total) by mouth every 6 (six) hours as needed for nausea or vomiting. 10/09/17   Harvest Dark, MD  ranitidine (ZANTAC) 300 MG tablet Take 300 mg by mouth at bedtime. In the afternoon    [provider]  sevelamer carbonate (RENVELA) 800 MG tablet Take 800 mg by  mouth 3 (three) times daily with meals.    [provider]    Allergies Ace inhibitors; Ativan [lorazepam]; Compazine [prochlorperazine edisylate]; Sumatriptan succinate; Dilaudid [hydromorphone hcl]; Ondansetron; Zofran [ondansetron hcl]; Codeine; Gabapentin; Lac bovis; Losartan; Oxycodone; Prochlorperazine; Reglan [metoclopramide]; Scopolamine; Tape; and Tapentadol  Family History  Problem Relation Age of Onset  . Kidney disease Mother   . Diabetes Mother   . Cancer Father   . Kidney disease Sister     Social History Social History   Tobacco Use  . Smoking status: Former Smoker    Packs/day: 0.50    Years: 40.00    Pack years: 20.00    Types: Cigarettes    Last attempt to quit: 02/13/2015    Years since quitting: 2.6  . Smokeless tobacco: Never Used  Substance Use Topics  . Alcohol use: Yes    Alcohol/week: 0.6 oz    Types: 1 Glasses of wine per week    Comment: glass wine week per pt  . Drug use: Yes    Types: Marijuana    Comment: once a day    Review of Systems Constitutional: No fever/chills Eyes: No visual changes. ENT: No sore throat. No stiff neck no neck pain Cardiovascular: Denies chest pain. Respiratory: Denies shortness of breath. Gastrointestinal:   No actual vomiting.  No diarrhea.  No constipation. Genitourinary: Negative for dysuria. Musculoskeletal: Negative lower extremity swelling Skin: Negative for rash. Neurological: Negative for severe headaches, focal weakness or numbness.   ____________________________________________   PHYSICAL EXAM:  VITAL SIGNS: ED Triage Vitals  Enc Vitals Group     BP 10/19/17 1544 (!) 154/58     Pulse Rate 10/19/17 1544 62     Resp 10/19/17 1544 20     Temp 10/19/17 1544 97.6 F (36.4 C)     Temp Source 10/19/17 1544 Oral     SpO2 10/19/17 1544 100 %     Weight 10/19/17 1544 184 lb (83.5 kg)     Height 10/19/17 1544 5\' 5"  (1.651 m)     Head Circumference --      Peak Flow --  Pain Score  10/19/17 1543 10     Pain Loc --      Pain Edu? --      Excl. in [place]? --     Constitutional: Alert and oriented. Well appearing and in no acute distress likely however yelling and screaming in the room demanding Phenergan and Dilaudid by name Eyes: Conjunctivae are normal Head: Atraumatic HEENT: No congestion/rhinnorhea. Mucous membranes are moist.  Oropharynx non-erythematous Neck:   Nontender with no meningismus, no masses, no stridor Cardiovascular: Normal rate, regular rhythm. Grossly normal heart sounds.  Good peripheral circulation. Respiratory: Normal respiratory effort.  No retractions. Lungs CTAB. Abdominal: Soft and minimal distractible epigastric discomfort which reproduces her discomfort. No distention. No guarding no rebound Back:  There is no focal tenderness or step off.  there is no midline tenderness there are no lesions noted. there is no CVA tenderness Musculoskeletal: No lower extremity tenderness, no upper extremity tenderness. No joint effusions, no DVT signs strong distal pulses no edema Neurologic:  Normal speech and language. No gross focal neurologic deficits are appreciated.  Skin:  Skin is warm, dry and intact. No rash noted. Psychiatric: Mood and affect are normal. Speech and behavior are normal.  ____________________________________________   LABS (all labs ordered are listed, but only abnormal results are displayed)  Labs Reviewed  COMPREHENSIVE METABOLIC PANEL - Abnormal; Notable for the following components:      Result Value   Potassium 3.2 (*)    Chloride 98 (*)    Glucose, Bld 101 (*)    Creatinine, Ser 4.35 (*)    Calcium 8.2 (*)    ALT 11 (*)    GFR calc non Af Amer 10 (*)    GFR calc Af Amer 12 (*)    All other components within normal limits  CBC - Abnormal; Notable for the following components:   RBC 3.70 (*)    Hemoglobin 11.0 (*)    HCT 34.2 (*)    RDW 18.2 (*)    All other components within normal limits  LIPASE, BLOOD   URINALYSIS, COMPLETE (UACMP) WITH MICROSCOPIC    Pertinent labs  results that were available during my care of the patient were reviewed by me and considered in my medical decision making (see chart for details). ____________________________________________  EKG  I personally interpreted any EKGs ordered by me or triage Sinus rate 65 bpm borderline prolonged QT no acute ST elevation or depression normal axis ____________________________________________  RADIOLOGY  Pertinent labs & imaging results that were available during my care of the patient were reviewed by me and considered in my medical decision making (see chart for details). If possible, patient and/or family made aware of any abnormal findings.  No results found. ____________________________________________    PROCEDURES  Procedure(s) performed: None  Procedures  Critical Care performed: None  ____________________________________________   INITIAL IMPRESSION / ASSESSMENT AND PLAN / ED COURSE  Pertinent labs & imaging results that were available during my care of the patient were reviewed by me and considered in my medical decision making (see chart for details).  Patient here yelling at staff and demanding specific very high potency IV narcotics for chronic abdominal pain in the context of not having her home narcotics.  Blood work is reassuring did have dialysis today.  Reproducible epigastric chronic pain.  Low suspicion for ACS PE or dissection, low suspicion for acute intrathoracic pathology, low suspicion for intra-abdominal pathology of any surgical or other acutely decompensating nature.  Patient was given Haldol  and was sleeping before she got it actually and then went back to sleep after, is in no acute distress during her entire stay here.  Certainly some element of drug-seeking behavior given her very specific demands for very high potency narcotics in the context of not having her home narcotics.  She is  working however on getting another provider for that.  We will try p.o. challenge and see how she does.    ____________________________________________   FINAL CLINICAL IMPRESSION(S) / ED DIAGNOSES  Final diagnoses:  None      This chart was dictated using voice recognition software.  Despite best efforts to proofread,  errors can occur which can change meaning.      Schuyler Amor, MD 10/19/17 2141    Schuyler Amor, MD 10/19/17 2204

## 2017-10-19 NOTE — ED Notes (Signed)
Patient is resting comfortably at this time with no signs of distress present. VS stable. Will continue to monitor.   

## 2017-10-19 NOTE — ED Triage Notes (Signed)
FIRST NURSE NOTE-here for headache after finishing dialysis today. NAD. bp 124/72 with EMS

## 2017-10-19 NOTE — ED Notes (Signed)
Pt. Verbalizes understanding of d/c instructions and follow-up. VS stable and pain controlled per pt.  Pt. In NAD at time of d/c and denies further concerns regarding this visit. Pt. Stable at the time of departure from the unit, departing unit by the safest and most appropriate manner per that pt condition and limitations with all belongings accounted for. Pt advised to return to the ED at any time for emergent concerns, or for new/worsening symptoms.   

## 2017-10-23 ENCOUNTER — Emergency Department: Payer: Medicare Other

## 2017-10-23 ENCOUNTER — Encounter: Payer: Self-pay | Admitting: *Deleted

## 2017-10-23 ENCOUNTER — Other Ambulatory Visit: Payer: Self-pay

## 2017-10-23 ENCOUNTER — Emergency Department
Admission: EM | Admit: 2017-10-23 | Discharge: 2017-10-23 | Disposition: A | Discharge status: Home / Self Care | Payer: Medicare Other | Attending: Emergency Medicine | Admitting: Emergency Medicine

## 2017-10-23 DIAGNOSIS — J449 Chronic obstructive pulmonary disease, unspecified: Secondary | ICD-10-CM | POA: Insufficient documentation

## 2017-10-23 DIAGNOSIS — Z794 Long term (current) use of insulin: Secondary | ICD-10-CM | POA: Diagnosis not present

## 2017-10-23 DIAGNOSIS — N186 End stage renal disease: Secondary | ICD-10-CM | POA: Diagnosis not present

## 2017-10-23 DIAGNOSIS — Z87891 Personal history of nicotine dependence: Secondary | ICD-10-CM | POA: Insufficient documentation

## 2017-10-23 DIAGNOSIS — I5032 Chronic diastolic (congestive) heart failure: Secondary | ICD-10-CM | POA: Diagnosis not present

## 2017-10-23 DIAGNOSIS — R1031 Right lower quadrant pain: Secondary | ICD-10-CM | POA: Insufficient documentation

## 2017-10-23 DIAGNOSIS — I132 Hypertensive heart and chronic kidney disease with heart failure and with stage 5 chronic kidney disease, or end stage renal disease: Secondary | ICD-10-CM | POA: Insufficient documentation

## 2017-10-23 DIAGNOSIS — E1140 Type 2 diabetes mellitus with diabetic neuropathy, unspecified: Secondary | ICD-10-CM | POA: Insufficient documentation

## 2017-10-23 DIAGNOSIS — Z7982 Long term (current) use of aspirin: Secondary | ICD-10-CM | POA: Diagnosis not present

## 2017-10-23 DIAGNOSIS — Z992 Dependence on renal dialysis: Secondary | ICD-10-CM | POA: Diagnosis not present

## 2017-10-23 DIAGNOSIS — Z79899 Other long term (current) drug therapy: Secondary | ICD-10-CM | POA: Diagnosis not present

## 2017-10-23 DIAGNOSIS — R109 Unspecified abdominal pain: Secondary | ICD-10-CM

## 2017-10-23 DIAGNOSIS — I259 Chronic ischemic heart disease, unspecified: Secondary | ICD-10-CM | POA: Diagnosis not present

## 2017-10-23 DIAGNOSIS — J45909 Unspecified asthma, uncomplicated: Secondary | ICD-10-CM | POA: Diagnosis not present

## 2017-10-23 LAB — CBC
HCT: 36.1 % (ref 35.0–47.0)
Hemoglobin: 11.3 g/dL — ABNORMAL LOW (ref 12.0–16.0)
MCH: 29.6 pg (ref 26.0–34.0)
MCHC: 31.4 g/dL — ABNORMAL LOW (ref 32.0–36.0)
MCV: 94.3 fL (ref 80.0–100.0)
Platelets: 149 10*3/uL — ABNORMAL LOW (ref 150–440)
RBC: 3.82 MIL/uL (ref 3.80–5.20)
RDW: 18.9 % — ABNORMAL HIGH (ref 11.5–14.5)
WBC: 5.8 10*3/uL (ref 3.6–11.0)

## 2017-10-23 LAB — COMPREHENSIVE METABOLIC PANEL
ALT: 19 U/L (ref 14–54)
AST: 37 U/L (ref 15–41)
Albumin: 2.8 g/dL — ABNORMAL LOW (ref 3.5–5.0)
Alkaline Phosphatase: 89 U/L (ref 38–126)
Anion gap: 8 (ref 5–15)
BUN: 16 mg/dL (ref 6–20)
CO2: 29 mmol/L (ref 22–32)
Calcium: 7.6 mg/dL — ABNORMAL LOW (ref 8.9–10.3)
Chloride: 101 mmol/L (ref 101–111)
Creatinine, Ser: 4.12 mg/dL — ABNORMAL HIGH (ref 0.44–1.00)
GFR calc Af Amer: 13 mL/min — ABNORMAL LOW (ref >60)
GFR calc non Af Amer: 11 mL/min — ABNORMAL LOW (ref >60)
Glucose, Bld: 59 mg/dL — ABNORMAL LOW (ref 65–99)
Potassium: 3.6 mmol/L (ref 3.5–5.1)
Sodium: 138 mmol/L (ref 135–145)
Total Bilirubin: 0.7 mg/dL (ref 0.3–1.2)
Total Protein: 6.1 g/dL — ABNORMAL LOW (ref 6.5–8.1)

## 2017-10-23 LAB — LIPASE, BLOOD: Lipase: 19 U/L (ref 11–51)

## 2017-10-23 MED ORDER — LIDOCAINE 5 % EX PTCH
1.0000 | MEDICATED_PATCH | CUTANEOUS | Status: DC
  Administered 2017-10-23: 1 via TRANSDERMAL
  Filled 2017-10-23: qty 1

## 2017-10-23 MED ORDER — METHOCARBAMOL 1000 MG/10ML IJ SOLN
1000.0000 mg | Freq: Once | INTRAMUSCULAR | Status: DC
  Filled 2017-10-23: qty 10

## 2017-10-23 MED ORDER — METHOCARBAMOL 1000 MG/10ML IJ SOLN
1000.0000 mg | Freq: Once | INTRAVENOUS | Status: AC
  Administered 2017-10-23: 1000 mg via INTRAVENOUS
  Filled 2017-10-23: qty 10

## 2017-10-23 MED ORDER — ACETAMINOPHEN 500 MG PO TABS
1000.0000 mg | ORAL_TABLET | Freq: Once | ORAL | Status: AC
  Administered 2017-10-23: 1000 mg via ORAL
  Filled 2017-10-23: qty 2

## 2017-10-23 MED ORDER — METHOCARBAMOL 500 MG PO TABS: 500.0000 mg | ORAL_TABLET | Freq: Four times a day (QID) | ORAL | 0 refills | Status: DC

## 2017-10-23 NOTE — ED Notes (Signed)
Patient transported to X-ray 

## 2017-10-23 NOTE — Death Summary Note (Deleted)
[facility] NAME: [name]    MR#:  [id number]  DATE OF BIRTH:  1961/09/22  DATE OF ADMISSION:  09/30/2017 ADMITTING PHYSICIAN: Vaughan Basta, MD  DATE OF DISCHARGE: No discharge date for patient encounter.  PRIMARY CARE PHYSICIAN: Freddy Finner, NP    ADMISSION DIAGNOSIS:  End-stage renal disease on hemodialysis ([place]) [N18.6, Z99.2] Acute osteomyelitis of toe of left foot ([place]) [M86.172] Acute pain of right knee [M25.561]  DISCHARGE DIAGNOSIS:  Principal Problem:   Osteomyelitis ([place])   SECONDARY DIAGNOSIS:   Past Medical History:  Diagnosis Date  . Anemia   . Anginal pain ([place])   . Anxiety   . Arthritis   . Asthma   . Broken wrist   . Bronchitis   . chronic diastolic CHF 03/13/2978  . COPD (chronic obstructive pulmonary disease) ([place])   . Coronary artery disease    a. cath 2013: stenting to RCA (report not available); b. cath 2014: LM nl, pLAD 40%, mLAD nl, ost LCx 40%, mid LCx nl, pRCA 30% @ site of prior stent, mRCA 50%  . Depression   . Diabetes mellitus without complication ([place])   . Diabetic neuropathy ([place])   . dialysis 2006  . Diverticulosis   . Dizziness   . Dyspnea   . Elevated lipids   . Environmental and seasonal allergies   . ESRD (end stage renal disease) on dialysis ([place])    M-W-F  . GERD (gastroesophageal reflux disease)   . Headache   . History of hiatal hernia   . HOH (hard of hearing)   . Hx of pancreatitis 2015  . Hypertension   . Lower extremity edema   . Mitral regurgitation    a. echo 10/2013: EF 62%, noWMA, mildly dilated LA, mild to mod MR/TR, GR1DD  . Myocardial infarction ([place])   . Orthopnea   . Pneumonia   . Renal cancer ([place])   . Renal insufficiency    Pt is on dialysis on M,W + F.  . [place]:   1acute left great toe osteomyelitis Resolving Treated initially with empiric IV vancomycin - in d/w ID/Dr. Ola Spurr- to continue on  doxycycline 100 mg p.o. twice daily for 28-day course/to f/u s/p discharge for re-evaluation in clinic, wound cultures noted forStaph aureus/lugdunensis-sensitivitiesnoted,s/pleft great toe amputation 10/02/2017 by podiatry   2chronic diabetes mellitus type 2  Stable on current regiment  3chronic benign essential hypertension  Stable Blood pressure medication had to be held during the initial part of her hospital stay due to relative hypotension, medications were resumed on day of discharge  4chronic ESRD Stable On hemodialysisM[facility]ph[facility] did see patient while in house   5history of CAD Stable Restarted on Plavix   6acute right knee pain  Stable  X-rays negative for acute fracture orthopedic surgery input greatly appreciated-follow-up as an outpatient for possibleSynvisc injection  7acute nausea/emesis Stable  Improved with Phenergan-multiple allergies to antiemetics noted  Acute abdominal series was unimpressive    DISCHARGE CONDITIONS:   On day of discharge patient is afebrile, he did not stable, tolerating diet, ready for discharge home with appropriate follow-up with primary care provider in 2-3 days for reevaluation, podiatry in 1-2 weeks status post left great toe amputation, follow-up with infectious disease provider in 2-3 weeks, for more specific details please see chart  CONSULTS OBTAINED:  Treatment Team:  Leonel Ramsay, MD  DRUG ALLERGIES:   Allergies  Allergen Reactions  .  Ace Inhibitors Swelling and Anaphylaxis  . Ativan [Lorazepam] Other (See Comments)    Reaction:Hallucinations and headaches Reaction:Hallucinations and headaches Reaction:  Hallucinations and headaches  . Compazine [Prochlorperazine Edisylate] Anaphylaxis and Nausea And Vomiting    Other reaction(s): dystonia from this vs. reglan 23 Jul - patient relates that she takes promethazine frequently with no problems. 23 Jul - patient relates that she takes  promethazine frequently with no problems.  . Sumatriptan Succinate     Other reaction(s): delirium and hallucinations per [facility] records  . Dilaudid [Hydromorphone Hcl] Other (See Comments)    Delirium   . Ondansetron Other (See Comments)    hallucinations    . Zofran [Ondansetron Hcl] Other (See Comments)    Reaction:  hallucinations  Reaction:  hallucinations   . Codeine Nausea And Vomiting  . Gabapentin Other (See Comments)    Reaction:  Unknown    . Lac Bovis Nausea And Vomiting  . Losartan Nausea Only  . Oxycodone Anxiety  . Prochlorperazine Other (See Comments)    Reaction:  Unknown . Patient does not remember reaction but she does have vertigo and anxiety along with n and v at times. Could be used to treat any of these   . Reglan [Metoclopramide]     Per patient her Dr. Evelina Bucy her off it  Per patient her Dr. Evelina Bucy her off it   . Scopolamine Other (See Comments)    Dizziness, also has vertigo already  . Tape Rash  . Tapentadol Rash    DISCHARGE MEDICATIONS:   Allergies as of 09/22/2017      Reactions   Ace Inhibitors Swelling, Anaphylaxis   Ativan [lorazepam] Other (See Comments)   Reaction:Hallucinations and headaches Reaction:Hallucinations and headaches Reaction:  Hallucinations and headaches   Compazine [prochlorperazine Edisylate] Anaphylaxis, Nausea And Vomiting   Other reaction(s): dystonia from this vs. reglan 23 Jul - patient relates that she takes promethazine frequently with no problems. 23 Jul - patient relates that she takes promethazine frequently with no problems.   Sumatriptan Succinate    Other reaction(s): delirium and hallucinations per [facility] records   Dilaudid [hydromorphone Hcl] Other (See Comments)   Delirium   Ondansetron Other (See Comments)   hallucinations    Zofran [ondansetron Hcl] Other (See Comments)   Reaction:  hallucinations  Reaction:  hallucinations    Codeine Nausea And Vomiting   Gabapentin Other (See Comments)   Reaction:   Unknown    Lac Bovis Nausea And Vomiting   Losartan Nausea Only   Oxycodone Anxiety   Prochlorperazine Other (See Comments)   Reaction:  Unknown . Patient does not remember reaction but she does have vertigo and anxiety along with n and v at times. Could be used to treat any of these   Reglan [metoclopramide]    Per patient her Dr. Evelina Bucy her off it  Per patient her Dr. Evelina Bucy her off it    Scopolamine Other (See Comments)   Dizziness, also has vertigo already   Tape Rash   Tapentadol Rash      Medication List    STOP taking these medications   ciprofloxacin 500 MG tablet Commonly known as:  CIPRO   clindamycin 300 MG capsule Commonly known as:  CLEOCIN   levofloxacin 750 MG tablet Commonly known as:  LEVAQUIN     TAKE these medications   albuterol 108 (90 Base) MCG/ACT inhaler Commonly known as:  PROVENTIL HFA;VENTOLIN HFA Inhale 2 puffs into the lungs every 6 (six) hours as needed  for shortness of breath.   albuterol (2.5 MG/3ML) 0.083% nebulizer solution Commonly known as:  PROVENTIL Take 2.5 mg by nebulization every 6 (six) hours as needed for wheezing or shortness of breath.   alum & mag hydroxide-simeth 976-734-19 MG/5ML suspension Commonly known as:  MAALOX MAX Take 5 mLs by mouth every 6 (six) hours as needed for indigestion.   amLODipine 10 MG tablet Commonly known as:  NORVASC Take 10 mg by mouth daily.   aspirin EC 81 MG tablet Take 81 mg by mouth daily.   atorvastatin 20 MG tablet Commonly known as:  LIPITOR Take 20 mg by mouth at bedtime.   carvedilol 25 MG tablet Commonly known as:  COREG Take 25 mg by mouth 2 (two) times daily with a meal.   cetirizine 10 MG tablet Commonly known as:  ZYRTEC Take 10 mg by mouth daily.   cholecalciferol 400 units Tabs tablet Commonly known as:  VITAMIN D Take 400 Units by mouth daily.   clopidogrel 75 MG tablet Commonly known as:  PLAVIX Take 1 tablet (75 mg total) by mouth daily.   cyanocobalamin 1000  MCG tablet Take 1,000 mcg by mouth daily with lunch.   DEXILANT 60 MG capsule Generic drug:  dexlansoprazole Take 60 mg by mouth daily.   diphenhydrAMINE 25 mg capsule Commonly known as:  BENADRYL Take 25 mg by mouth every 6 (six) hours as needed for itching.   doxycycline 100 MG tablet Commonly known as:  VIBRA-TABS Take 1 tablet (100 mg total) by mouth every 12 (twelve) hours.   famotidine 20 MG tablet Commonly known as:  PEPCID Take 1 tablet (20 mg total) by mouth 2 (two) times daily.   FLUoxetine 20 MG capsule Commonly known as:  PROZAC Take 1 capsule (20 mg total) by mouth daily. What changed:  when to take this   glucose 4 GM chewable tablet Chew 3-4 tablets by mouth as needed for low blood sugar (blood sugar less than 60).   HYDROcodone-acetaminophen 5-325 MG tablet Commonly known as:  NORCO/VICODIN Take 1 tablet by mouth every 6 (six) hours as needed for moderate pain or severe pain. What changed:  when to take this   hydrOXYzine 25 MG tablet Commonly known as:  ATARAX/VISTARIL Take 25 mg by mouth every 6 (six) hours as needed.   insulin detemir 100 UNIT/ML injection Commonly known as:  LEVEMIR Inject 0.02 mLs (2 Units total) into the skin 2 (two) times daily. What changed:    how much to take  when to take this   lipase/protease/amylase 12000 units Cpep capsule Commonly known as:  CREON Take 24,000-36,000 Units by mouth 3 (three) times daily with meals. Take 36000 mg in the morning, 24000 midday & 36000 in the evening   meclizine 12.5 MG tablet Commonly known as:  ANTIVERT Take 1 tablet by mouth three times daily as needed   metoCLOPramide 10 MG tablet Commonly known as:  REGLAN Take 1 tablet (10 mg total) by mouth every 8 (eight) hours as needed for nausea or vomiting. What changed:    how much to take  when to take this   multivitamin Tabs tablet Take 1 tablet by mouth daily.   nitroGLYCERIN 0.4 MG SL tablet Commonly known as:   NITROSTAT Place 0.4 mg under the tongue every 5 (five) minutes x 3 doses as needed.   omega-3 acid ethyl esters 1 g capsule Commonly known as:  LOVAZA Take 1 g by mouth daily.   promethazine 12.5 MG  tablet Commonly known as:  PHENERGAN Take 1 tablet (12.5 mg total) by mouth every 6 (six) hours as needed for nausea or vomiting.   ranitidine 150 MG tablet Commonly known as:  ZANTAC Take 150 mg by mouth 2 (two) times daily. In the afternoon   ranolazine 500 MG 12 hr tablet Commonly known as:  RANEXA Take 1,000 mg by mouth 2 (two) times daily.   rifaximin 550 MG Tabs tablet Commonly known as:  XIFAXAN Take 550 mg by mouth 2 (two) times daily.   sevelamer carbonate 800 MG tablet Commonly known as:  RENVELA Take 800 mg by mouth 3 (three) times daily with meals.   sucralfate 1 g tablet Commonly known as:  CARAFATE Take 1 g by mouth 4 (four) times daily -  with meals and at bedtime.        DISCHARGE INSTRUCTIONS:    If you experience worsening of your admission symptoms, develop shortness of breath, life threatening emergency, suicidal or homicidal thoughts you must seek medical attention immediately by calling 911 or calling your MD immediately  if symptoms less severe.  You Must read complete instructions/literature along with all the possible adverse reactions/side effects for all the Medicines you take and that have been prescribed to you. Take any new Medicines after you have completely understood and accept all the possible adverse reactions/side effects.   Please note  You were cared for by a hospitalist during your hospital stay. If you have any questions about your discharge medications or the care you received while you were in the hospital after you are discharged, you can call the unit and asked to speak with the hospitalist on call if the hospitalist that took care of you is not available. Once you are discharged, your primary care physician will handle any further  medical issues. Please note that NO REFILLS for any discharge medications will be authorized once you are discharged, as it is imperative that you return to your primary care physician (or establish a relationship with a primary care physician if you do not have one) for your aftercare needs so that they can reassess your need for medications and monitor your lab values.    Today   CHIEF COMPLAINT:   Chief Complaint  Patient presents with  . Osteomyelitis     HISTORY OF PRESENT ILLNESS:  57 y.o. female with a known history of COPD, ESRD, DM, CAD, Diastolic CHF- had podiatry did nail cutting last week, had some bleed after that. Applied dressing and ointment. Yesterday she noted discoloration of her toe and some pain. Went to PMD- he gave oral Abx and sent for Xray to hospital. She was noted to have Osteomyelitis on Xray so PMD called her to go to ER. Received her regular HD today.  She had a fall 2 weeks ago and since than her right knee is hurting. She tried all OTC pain meds and icy hot, but not helping .    VITAL SIGNS:  Blood pressure (!) 167/64, pulse 79, temperature 98 F (36.7 C), temperature source Oral, resp. rate 18, height 5' 5.5" (1.664 m), weight 82.2 kg (181 lb 3.2 oz), SpO2 100 %.  I/O:    Intake/Output Summary (Last 24 hours) at 10/22/2017 1251 Last data filed at 10/05/2017 1900 Gross per 24 hour  Intake 0 ml  Output 3000 ml  Net -3000 ml    PHYSICAL EXAMINATION:  GENERAL:  57 y.o.-year-old patient lying in the bed with no acute distress.  EYES:  Pupils equal, round, reactive to light and accommodation. No scleral icterus. Extraocular muscles intact.  HEENT: Head atraumatic, normocephalic. Oropharynx and nasopharynx clear.  NECK:  Supple, no jugular venous distention. No thyroid enlargement, no tenderness.  LUNGS: Normal breath sounds bilaterally, no wheezing, rales,rhonchi or crepitation. No use of accessory muscles of respiration.  CARDIOVASCULAR: S1, S2  normal. No murmurs, rubs, or gallops.  ABDOMEN: Soft, non-tender, non-distended. Bowel sounds present. No organomegaly or mass.  EXTREMITIES: No pedal edema, cyanosis, or clubbing.  NEUROLOGIC: Cranial nerves II through XII are intact. Muscle strength 5/5 in all extremities. Sensation intact. Gait not checked.  PSYCHIATRIC: The patient is alert and oriented x 3.  SKIN: No obvious rash, lesion, or ulcer.   DATA REVIEW:   CBC Recent Labs  Lab 10/04/17 0424  WBC 4.5  HGB 9.4*  HCT 29.3*  PLT 166    Chemistries  Recent Labs  Lab 10/01/17 0826  NA 135  K 4.3  CL 96*  CO2 28  GLUCOSE 101*  BUN 10  CREATININE 4.32*  CALCIUM 7.7*    Cardiac Enzymes No results for input(s): TROPONINI in the last 168 hours.  Microbiology Results  Results for orders placed or performed during the hospital encounter of 09/30/17  Wound or Superficial Culture     Status: None   Collection Time: 09/30/17  3:10 PM  Result Value Ref Range Status   Specimen Description   Final    TOE LEFT Performed at [facility], [address], [place], [place] [address]    Special Requests   Final    NONE Performed at [facility], [address] [place], [place] [address]    Gram Stain   Final    RARE WBC PRESENT, PREDOMINANTLY MONONUCLEAR RARE GRAM POSITIVE COCCI Performed at [facility], [place] [address] [place], [place] [address]    Culture FEW STAPHYLOCOCCUS AUREUS  Final   Report Status 10/03/2017 FINAL  Final   Organism ID, Bacteria STAPHYLOCOCCUS AUREUS  Final      Susceptibility   Staphylococcus aureus - MIC*    CIPROFLOXACIN <=0.5 SENSITIVE Sensitive     ERYTHROMYCIN >=8 RESISTANT Resistant     GENTAMICIN <=0.5 SENSITIVE Sensitive     OXACILLIN 0.5 SENSITIVE Sensitive     TETRACYCLINE <=1 SENSITIVE Sensitive     VANCOMYCIN 1 SENSITIVE Sensitive     TRIMETH/SULFA <=10 SENSITIVE Sensitive     CLINDAMYCIN <=0.25 SENSITIVE Sensitive     RIFAMPIN <=0.5 SENSITIVE  Sensitive     Inducible Clindamycin NEGATIVE Sensitive     * FEW STAPHYLOCOCCUS AUREUS  MRSA PCR Screening     Status: None   Collection Time: 09/30/17  7:55 PM  Result Value Ref Range Status   MRSA by PCR NEGATIVE NEGATIVE Final    Comment:        The GeneXpert MRSA Assay (FDA approved for NASAL specimens only), is one component of a comprehensive MRSA colonization surveillance program. It is not intended to diagnose MRSA infection nor to guide or monitor treatment for MRSA infections. Performed at [facility], [address] [place], [place] [address]   Aerobic/Anaerobic Culture (surgical/deep wound)     Status: None (Preliminary result)   Collection Time: 10/02/17  8:02 AM  Result Value Ref Range Status   Specimen Description   Final    BONE Performed at [facility], [address] [place], [place] [address]    Special Requests   Final  GREAT TOE Performed at [facility], [address] [place], [place] [address]    Gram Stain   Final    RARE WBC PRESENT, PREDOMINANTLY MONONUCLEAR NO ORGANISMS SEEN Performed at [facility], [place] [address], [place], [place] [address]    Culture   Final    FEW STAPHYLOCOCCUS AUREUS SUSCEPTIBILITIES PERFORMED ON PREVIOUS CULTURE WITHIN THE LAST 5 DAYS. FEW STAPHYLOCOCCUS LUGDUNENSIS CRITICAL RESULT CALLED TO, READ BACK BY AND VERIFIED WITH: C GROGAN,RN AT 1230 10/03/17 BY L BENFIELD CONCERNING GROWTH ON CULTURE NO ANAEROBES ISOLATED; CULTURE IN PROGRESS FOR 5 DAYS    Report Status PENDING  Incomplete   Organism ID, Bacteria STAPHYLOCOCCUS LUGDUNENSIS  Final      Susceptibility   Staphylococcus lugdunensis - MIC*    CIPROFLOXACIN <=0.5 SENSITIVE Sensitive     ERYTHROMYCIN >=8 RESISTANT Resistant     GENTAMICIN <=0.5 SENSITIVE Sensitive     OXACILLIN 0.5 SENSITIVE Sensitive     TETRACYCLINE <=1 SENSITIVE Sensitive     VANCOMYCIN <=0.5 SENSITIVE Sensitive     TRIMETH/SULFA <=10 SENSITIVE  Sensitive     CLINDAMYCIN >=8 RESISTANT Resistant     RIFAMPIN <=0.5 SENSITIVE Sensitive     Inducible Clindamycin NEGATIVE Sensitive     * FEW STAPHYLOCOCCUS LUGDUNENSIS    RADIOLOGY:  No results found.  EKG:   Orders placed or performed during the hospital encounter of 09/03/17  . ED EKG  . ED EKG  . EKG 12-Lead  . EKG 12-Lead  . EKG      Management plans discussed with the patient, family and they are in agreement.  CODE STATUS:     Code Status Orders  (From admission, onward)        Start     Ordered   09/30/17 1804  Full code  Continuous     09/30/17 1803    Code Status History    Date Active Date Inactive Code Status Order ID Comments User Context   09/03/2017 22:43 09/05/2017 19:01 Full Code [id number]  Lance Coon, MD Inpatient   05/04/2017 18:33 05/05/2017 23:30 Full Code [id number]  Henreitta Leber, MD Inpatient   03/23/2017 15:51 03/25/2017 17:39 Full Code [id number]  Gladstone Lighter, MD Inpatient   04/21/2016 15:59 04/21/2016 17:17 Full Code [id number]  Nicholes Mango, MD Inpatient   04/08/2016 14:15 04/11/2016 19:47 Full Code [id number]  Theodoro Grist, MD Inpatient   03/15/2016 13:22 03/20/2016 00:19 Full Code [id number]  Bettey Costa, MD Inpatient   02/16/2016 14:09 02/19/2016 22:38 Full Code [id number]  Bettey Costa, MD ED   12/17/2015 11:39 12/21/2015 22:09 Full Code [id number]  Dustin Flock, MD ED   11/12/2015 16:02 12/01/2015 19:43 Full Code [id number]  Bettey Costa, MD Inpatient   10/07/2015 09:37 10/12/2015 19:59 Full Code [id number]  Fritzi Mandes, MD ED   10/03/2015 03:55 10/05/2015 22:30 Full Code [id number]  Lance Coon, MD Inpatient   08/31/2015 16:00 09/03/2015 22:09 Full Code [id number]  Demetrios Loll, MD ED   07/26/2015 15:28 07/26/2015 21:36 Full Code [id number]  Dionisio David, MD Inpatient   07/22/2015 17:36 07/26/2015 15:28 Full Code [id number]  Vaughan Basta, MD Inpatient   06/05/2015 20:39 06/07/2015 20:03 Full Code [id number]  Bettey Costa, MD Inpatient    05/21/2015 21:42 05/24/2015 16:26 Full Code [id number]  Henreitta Leber, MD Inpatient   04/24/2015 08:55 04/26/2015 22:11 Full Code [id number]  Demetrios Loll, MD Inpatient   04/01/2015 15:58 04/05/2015 20:07 Full Code [id number]  Epifanio Lesches, MD ED   03/06/2015 08:05  03/09/2015 21:11 Full Code [id number]  Juluis Mire, MD Inpatient   02/19/2015 16:14 02/24/2015 17:41 Full Code [id number]  Aldean Jewett, MD Inpatient      TOTAL TIME TAKING CARE OF THIS PATIENT: 45 minutes.    Avel Peace Brigida Scotti M.D on 10/22/2017 at 12:51 PM  Between 7am to 6pm - Pager - [phone]  After 6pm go to [email or web address] - password [email or web address] [facility]  Office  [phone]  CC: Primary care physician; Freddy Finner, NP   Note: This dictation was prepared with Dragon dictation along with smaller phrase technology. Any transcriptional errors that result from this process are unintentional.

## 2017-10-23 NOTE — ED Notes (Signed)
RUQ pain that began during dialysis today, stopped dialysis half way thru.  Hx of kidney stones in past, pt does not have appendix or gallbladder.

## 2017-10-23 NOTE — ED Triage Notes (Addendum)
Patient c/o RUQ pain that radiates to the back that began 30 minutes prior to finishing dialysis. Patient c/o nausea. Patient states she has a history of pancreatitis and gastroparesis. Patient states back pain feels like kidney pain. Patient was seen here on 1/28 for same complaint. Patient states her "kidney doctor" sent her here today.

## 2017-10-23 NOTE — ED Notes (Signed)
Pt contacted son to pick her up from ED

## 2017-10-23 NOTE — ED Provider Notes (Signed)
[facility] Emergency Department Provider Note  ____________________________________________  Time seen: Approximately 6:17 PM  I have reviewed the triage vital signs and the nursing notes.   HISTORY  Chief Complaint Abdominal Pain   HPI [name] is a 57 y.o. female with h/o ESRD on HD (MWF), and frequent visits for various different pain complaints who presents from HD for R flank pain. Patient reports that the pain started earlier today, is intermittent, sharp, located in the right flank, 8 out of 10, worse with palpation and movement. She reports she has had kidney stones in the past. She has had a cholecystectomy several years ago. No nausea, no vomiting, no fever, no chills, no cough or shortness of breath, no diarrhea or constipation. She denies any trauma. She reports that her dialysis was cut short after 30 minutes because they had no more fluid to pull. She has no chest pain, no personal or family history of blood clots, no recent travel or immobilization, no leg pain or swelling, no hemoptysis, no exogenous hormones.  Past Medical History:  Diagnosis Date  . Anemia   . Anginal pain ([place])   . Anxiety   . Arthritis   . Asthma   . Broken wrist   . Bronchitis   . chronic diastolic CHF 11/05/863  . COPD (chronic obstructive pulmonary disease) ([place])   . Coronary artery disease    a. cath 2013: stenting to RCA (report not available); b. cath 2014: LM nl, pLAD 40%, mLAD nl, ost LCx 40%, mid LCx nl, pRCA 30% @ site of prior stent, mRCA 50%  . Depression   . Diabetes mellitus without complication ([place])   . Diabetic neuropathy ([place])   . dialysis 2006  . Diverticulosis   . Dizziness   . Dyspnea   . Elevated lipids   . Environmental and seasonal allergies   . ESRD (end stage renal disease) on dialysis ([place])    M-W-F  . GERD (gastroesophageal reflux disease)   . Headache   . History of hiatal hernia   . HOH (hard of hearing)   . Hx of  pancreatitis 2015  . Hypertension   . Lower extremity edema   . Mitral regurgitation    a. echo 10/2013: EF 62%, noWMA, mildly dilated LA, mild to mod MR/TR, GR1DD  . Myocardial infarction ([place])   . Orthopnea   . Pneumonia   . Renal cancer ([place])   . Renal insufficiency    Pt is on dialysis on M,W + F.  . Wheezing     Patient Active Problem List   Diagnosis Date Noted  . Osteomyelitis ([place]) 09/30/2017  . Carotid stenosis 06/18/2017  . Shortness of breath 05/04/2017  . Cellulitis of lower extremity 07/29/2016  . Chronic venous insufficiency 07/29/2016  . Lymphedema 07/29/2016  . TIA (transient ischemic attack) 04/21/2016  . Altered mental status 04/08/2016  . Hyperammonemia ([place]) 04/08/2016  . Elevated troponin 04/08/2016  . Depression 04/08/2016  . Depression, major, recurrent, severe with psychosis ([place]) 04/08/2016  . Blood in stool   . Intractable cyclical vomiting with nausea   . Reflux esophagitis   . Gastritis   . Generalized abdominal pain   . Uncontrollable vomiting   . Major depressive disorder, recurrent episode, moderate ([place]) 03/15/2016  . Adjustment disorder with mixed anxiety and depressed mood 03/15/2016  . Malnutrition of moderate degree 12/01/2015  . Renal mass   . Dyspnea   . Acute renal failure ([place])   .  Respiratory failure ([place])   . High temperature 11/14/2015  . Pulmonary edema   . Encounter for central line placement   . Encounter for orogastric (OG) tube placement   . Nausea 11/12/2015  . Hyperkalemia 10/03/2015  . Diarrhea, unspecified 07/22/2015  . Pneumonia 05/21/2015  . Hypoglycemia 04/24/2015  . Unresponsiveness 04/24/2015  . Bradycardia 04/24/2015  . Hypothermia 04/24/2015  . Acute respiratory failure ([place]) 04/24/2015  . Acute diastolic CHF (congestive heart failure) ([place]) 04/05/2015  . Diabetic gastroparesis ([place]) 04/05/2015  . Hypokalemia 04/05/2015  . Generalized weakness 04/05/2015  . Acute pulmonary edema ([place]) 04/03/2015  .  Nausea and vomiting 04/03/2015  . Hypoglycemia associated with diabetes ([place]) 04/03/2015  . Anemia of chronic disease 04/03/2015  . Secondary hyperparathyroidism ([place]) 04/03/2015  . Pressure ulcer 04/02/2015  . Acute respiratory failure with hypoxia ([place]) 04/01/2015  . Adjustment disorder with anxiety 03/14/2015  . Somatic symptom disorder, mild 03/08/2015  . Coronary artery disease involving native coronary artery of native heart without angina pectoris   . Nausea & vomiting 03/06/2015  . Abdominal pain 03/06/2015  . DM (diabetes mellitus) ([place]) 03/06/2015  . HTN (hypertension) 03/06/2015  . Gastroparesis 02/24/2015  . Pleural effusion 02/19/2015  . HCAP (healthcare-associated pneumonia) 02/19/2015  . End-stage renal disease on hemodialysis ([place]) 02/19/2015    Past Surgical History:  Procedure Laterality Date  . ABDOMINAL HYSTERECTOMY  1992  . AMPUTATION TOE Left 10/02/2017   Procedure: AMPUTATION TOE-LEFT GREAT TOE;  Surgeon: Albertine Patricia, DPM;  Location: [facility] ORS;  Service: Podiatry;  Laterality: Left;  . APPENDECTOMY    . CARDIAC CATHETERIZATION Left 07/26/2015   Procedure: Left Heart Cath and Coronary Angiography;  Surgeon: Dionisio David, MD;  Location: [facility];  Service: Cardiovascular;  Laterality: Left;  . CATARACT EXTRACTION W/ INTRAOCULAR LENS IMPLANT Right   . CATARACT EXTRACTION W/PHACO Left 03/10/2017   Procedure: CATARACT EXTRACTION PHACO AND INTRAOCULAR LENS PLACEMENT (IOC);  Surgeon: Birder Robson, MD;  Location: [facility] ORS;  Service: Ophthalmology;  Laterality: Left;  [place] 00:51.9 AP% 14.2 CDE 7.39 fluid pack lot # [id number]  . CHOLECYSTECTOMY    . COLONOSCOPY WITH PROPOFOL N/A 08/12/2016   Procedure: COLONOSCOPY WITH PROPOFOL;  Surgeon: Lollie Sails, MD;  Location: [facility] ENDOSCOPY;  Service: Endoscopy;  Laterality: N/A;  . DIALYSIS FISTULA CREATION Left    upper arm  . dialysis grafts    . ESOPHAGOGASTRODUODENOSCOPY N/A 03/08/2015    Procedure: ESOPHAGOGASTRODUODENOSCOPY (EGD);  Surgeon: Manya Silvas, MD;  Location: [facility] ENDOSCOPY;  Service: Endoscopy;  Laterality: N/A;  . ESOPHAGOGASTRODUODENOSCOPY (EGD) WITH PROPOFOL N/A 03/18/2016   Procedure: ESOPHAGOGASTRODUODENOSCOPY (EGD) WITH PROPOFOL;  Surgeon: [name], MD;  Location: [facility] ENDOSCOPY;  Service: Endoscopy;  Laterality: N/A;  . EYE SURGERY Right 2018  . FECAL TRANSPLANT N/A 08/23/2015   Procedure: FECAL TRANSPLANT;  Surgeon: Manya Silvas, MD;  Location: [facility] ENDOSCOPY;  Service: Endoscopy;  Laterality: N/A;  . HAND SURGERY Bilateral   . PERIPHERAL VASCULAR CATHETERIZATION N/A 12/20/2015   Procedure: Thrombectomy of dialysis access versus permcath placement;  Surgeon: Algernon Huxley, MD;  Location: [facility];  Service: Cardiovascular;  Laterality: N/A;  . PERIPHERAL VASCULAR CATHETERIZATION N/A 12/20/2015   Procedure: A/V Shunt Intervention;  Surgeon: Algernon Huxley, MD;  Location: [place] [facility];  Service: Cardiovascular;  Laterality: N/A;  . PERIPHERAL VASCULAR CATHETERIZATION N/A 12/20/2015   Procedure: A/V Shuntogram/Fistulagram;  Surgeon: Algernon Huxley, MD;  Location: [facility] LAB;  Service: Cardiovascular;  Laterality: N/A;  . PERIPHERAL VASCULAR CATHETERIZATION N/A 01/02/2016   Procedure: A/V Shuntogram/Fistulagram;  Surgeon: Algernon Huxley, MD;  Location: [facility] LAB;  Service: Cardiovascular;  Laterality: N/A;  . PERIPHERAL VASCULAR CATHETERIZATION N/A 01/02/2016   Procedure: A/V Shunt Intervention;  Surgeon: Algernon Huxley, MD;  Location: [facility];  Service: Cardiovascular;  Laterality: N/A;    Prior to Admission medications   Medication Sig Start Date End Date Taking? Authorizing Provider  acidophilus (RISAQUAD) CAPS capsule Take 1 capsule by mouth daily.    [provider]  alum & mag hydroxide-simeth (MAALOX MAX) 400-400-40 MG/5ML suspension Take 5 mLs by mouth every 6 (six) hours as needed for  indigestion. Patient not taking: Reported on 10/09/2017 06/09/17   Rudene Re, MD  amLODipine (NORVASC) 10 MG tablet Take 10 mg by mouth daily.    [provider]  aspirin EC 81 MG tablet Take 81 mg by mouth daily. 09/08/16   [provider]  atorvastatin (LIPITOR) 20 MG tablet Take 20 mg by mouth at bedtime.  12/02/16   [provider]  cetirizine (ZYRTEC) 10 MG tablet Take 10 mg by mouth daily.     [provider]  cholecalciferol (VITAMIN D) 400 units TABS tablet Take 400 Units by mouth daily.    [provider]  clopidogrel (PLAVIX) 75 MG tablet Take 1 tablet (75 mg total) by mouth daily. 03/25/17 03/25/18  Dustin Flock, MD  cyanocobalamin 1000 MCG tablet Take 1,000 mcg by mouth daily with lunch.     [provider]  cyclobenzaprine (FLEXERIL) 5 MG tablet Take 1 tablet (5 mg total) by mouth 3 (three) times daily as needed for muscle spasms. 10/15/17   Merlyn Lot, MD  dexlansoprazole (DEXILANT) 60 MG capsule Take 60 mg by mouth daily.    [provider]  doxycycline (VIBRA-TABS) 100 MG tablet Take 1 tablet (100 mg total) by mouth every 12 (twelve) hours. Patient not taking: Reported on 10/09/2017 10/05/2017   Salary, Holly Bodily D, MD  famotidine (PEPCID) 20 MG tablet Take 1 tablet (20 mg total) by mouth 2 (two) times daily. Patient not taking: Reported on 09/30/2017 06/09/17 06/09/18  Rudene Re, MD  FLUoxetine (PROZAC) 20 MG capsule Take 1 capsule (20 mg total) by mouth daily. 04/10/16   Hower, Aaron Mose, MD  glucose 4 GM chewable tablet Chew 3-4 tablets by mouth as needed for low blood sugar (blood sugar less than 60).     [provider]  HYDROcodone-acetaminophen (NORCO/VICODIN) 5-325 MG tablet Take 1 tablet by mouth every 6 (six) hours as needed for moderate pain or severe pain. Patient taking differently: Take 1 tablet by mouth 3 (three) times daily as needed for moderate pain or severe pain.  05/05/17   Gouru,  Illene Silver, MD  insulin detemir (LEVEMIR) 100 UNIT/ML injection Inject 0.02 mLs (2 Units total) into the skin 2 (two) times daily. Patient taking differently: Inject 4 Units into the skin at bedtime.  09/05/17   Epifanio Lesches, MD  lipase/protease/amylase (CREON) 12000 UNITS CPEP capsule Take 24,000-36,000 Units by mouth 3 (three) times daily with meals. Take 36000 mg in the morning, 24000 midday & 36000 in the evening    [provider]  methocarbamol (ROBAXIN) 500 MG tablet Take 1 tablet (500 mg total) by mouth 4 (four) times daily. 10/23/17   Rudene Re, MD  metoCLOPramide (REGLAN) 10 MG tablet Take 1 tablet (10 mg total) by mouth every 8 (eight) hours as needed for  nausea or vomiting. Patient not taking: Reported on 10/07/2017 06/08/17   Earleen Newport, MD  multivitamin (RENA-VIT) TABS tablet Take 1 tablet by mouth daily.     [provider]  naloxone [facility]) 0.4 MG/ML injection Inject 0.4 mg into the vein as needed.    [provider]  nitroGLYCERIN (NITROSTAT) 0.4 MG SL tablet Place 0.4 mg under the tongue every 5 (five) minutes x 3 doses as needed.    [provider]  OLANZapine (ZYPREXA) 5 MG tablet Take 5 mg by mouth at bedtime.    [provider]  omega-3 acid ethyl esters (LOVAZA) 1 g capsule Take 1 g by mouth daily.     [provider]  promethazine (PHENERGAN) 25 MG suppository Place 1 suppository (25 mg total) rectally every 6 (six) hours as needed for nausea. 10/09/17 10/09/18  Harvest Dark, MD  promethazine (PHENERGAN) 25 MG tablet Take 0.5 tablets (12.5 mg total) by mouth every 6 (six) hours as needed for nausea or vomiting. 10/09/17   Harvest Dark, MD  ranitidine (ZANTAC) 300 MG tablet Take 300 mg by mouth at bedtime. In the afternoon    [provider]  sevelamer carbonate (RENVELA) 800 MG tablet Take 800 mg by mouth 3 (three) times daily with meals.    [provider]    Allergies Ace  inhibitors; Ativan [lorazepam]; Compazine [prochlorperazine edisylate]; Sumatriptan succinate; Dilaudid [hydromorphone hcl]; Ondansetron; Zofran [ondansetron hcl]; Codeine; Gabapentin; [place] bovis; Losartan; Oxycodone; Prochlorperazine; Reglan [metoclopramide]; Scopolamine; Tape; and Tapentadol  Family History  Problem Relation Age of Onset  . Kidney disease Mother   . Diabetes Mother   . Cancer Father   . Kidney disease Sister     Social History Social History   Tobacco Use  . Smoking status: Former Smoker    Packs/day: 0.50    Years: 40.00    Pack years: 20.00    Types: Cigarettes    Last attempt to quit: 02/13/2015    Years since quitting: 2.6  . Smokeless tobacco: Never Used  Substance Use Topics  . Alcohol use: Yes    Alcohol/week: 0.6 oz    Types: 1 Glasses of wine per week    Comment: glass wine week per pt  . Drug use: Yes    Types: Marijuana    Comment: once a day    Review of Systems  Constitutional: Negative for fever. Eyes: Negative for visual changes. ENT: Negative for sore throat. Neck: No neck pain  Cardiovascular: Negative for chest pain. Respiratory: Negative for shortness of breath. Gastrointestinal: Negative for abdominal pain, vomiting or diarrhea. Genitourinary: Negative for dysuria. + R flank pain Musculoskeletal: Negative for back pain. Skin: Negative for rash. Neurological: Negative for headaches, weakness or numbness. Psych: No SI or HI  ____________________________________________   PHYSICAL EXAM:  VITAL SIGNS: ED Triage Vitals  Enc Vitals Group     BP 10/23/17 1448 (!) 182/59     Pulse Rate 10/23/17 1448 68     Resp 10/23/17 1448 18     Temp 10/23/17 1448 98.1 F (36.7 C)     Temp Source 10/23/17 1448 Oral     SpO2 10/23/17 1448 100 %     Weight 10/23/17 1451 174 lb (78.9 kg)     Height 10/23/17 1451 5\' 5"  (1.651 m)     Head Circumference --      Peak Flow --      Pain Score 10/23/17 1447 10     Pain Loc --  Pain Edu? --       Excl. in [place]? --     Constitutional: Alert and oriented. Well appearing and in no apparent distress. HEENT:      Head: Normocephalic and atraumatic.         Eyes: Conjunctivae are normal. Sclera is non-icteric.       Mouth/Throat: Mucous membranes are moist.       Neck: Supple with no signs of meningismus. Cardiovascular: Regular rate and rhythm. No murmurs, gallops, or rubs. 2+ symmetrical distal pulses are present in all extremities. No JVD. Respiratory: Normal respiratory effort. Lungs are clear to auscultation bilaterally. No wheezes, crackles, or rhonchi.  Gastrointestinal: Soft, ttp over the RUQ, and non distended with positive bowel sounds. No rebound or guarding. Genitourinary: patient jumps out of the bed when I pushed on the muscles of her R flank. . Musculoskeletal: Nontender with normal range of motion in all extremities. No edema, cyanosis, or erythema of extremities. Neurologic: Normal speech and language. Face is symmetric. Moving all extremities. No gross focal neurologic deficits are appreciated. Skin: Skin is warm, dry and intact. No rash noted. Psychiatric: Mood and affect are normal. Speech and behavior are normal.  ____________________________________________   LABS (all labs ordered are listed, but only abnormal results are displayed)  Labs Reviewed  CBC - Abnormal; Notable for the following components:      Result Value   Hemoglobin 11.3 (*)    MCHC 31.4 (*)    RDW 18.9 (*)    Platelets 149 (*)    All other components within normal limits  COMPREHENSIVE METABOLIC PANEL - Abnormal; Notable for the following components:   Glucose, Bld 59 (*)    Creatinine, Ser 4.12 (*)    Calcium 7.6 (*)    Total Protein 6.1 (*)    Albumin 2.8 (*)    GFR calc non Af Amer 11 (*)    GFR calc Af Amer 13 (*)    All other components within normal limits  LIPASE, BLOOD  URINALYSIS, COMPLETE (UACMP) WITH MICROSCOPIC    ____________________________________________  EKG  ED ECG REPORT I, Rudene Re, the attending physician, personally viewed and interpreted this ECG.  Normal sinus rhythm, rate is 66, prolonged QTC, normal axis, no ST elevations or depressions. Unchanged from prior from 3 days ago. ____________________________________________  RADIOLOGY  Interpreted by me: US renal: No hydronephrosis  CXR: No pulmonary edema  KUB: Normal   Interpretation by Radiologist:  Dg Chest 2 View  Result Date: 10/23/2017 CLINICAL DATA:  RIGHT upper quadrant pain.  History of COPD. EXAM: CHEST  2 VIEW COMPARISON:  10/15/2017. FINDINGS: Cardiac size upper limits normal. No consolidation or edema. Mild vascular congestion. RIGHT pleural effusion. No pneumothorax. No acute osseous finding. IMPRESSION: Improved cardiac size, upper limits normal. Small RIGHT effusion. No definite consolidation. Electronically Signed   By: Staci Righter M.D.   On: 10/23/2017 18:42   Dg Abdomen 1 View  Result Date: 10/23/2017 CLINICAL DATA:  Right flank pain. EXAM: ABDOMEN - 1 VIEW COMPARISON:  None. FINDINGS: The bowel gas pattern is normal. No radio-opaque calculi. Vascular calcifications noted. IMPRESSION: No radiopaque renal calculi seen. Nonobstructive bowel gas pattern. Electronically Signed   By: Fidela Salisbury M.D.   On: 10/23/2017 18:44   US Renal  Result Date: 10/23/2017 CLINICAL DATA:  Right flank pain EXAM: RENAL / URINARY TRACT ULTRASOUND COMPLETE COMPARISON:  CT 09/03/2017 FINDINGS: Right Kidney: Length: Atrophic, 5.7 cm. Increased echotexture throughout the right kidney. 8 mm echogenic  focus in the region of the renal pelvis. No hydronephrosis. Left Kidney: Length: Atrophic, 6.1 cm. Increased echotexture throughout the left kidney. No mass or hydronephrosis. Bladder: Appears normal for degree of bladder distention. IMPRESSION: Small, echogenic kidneys bilaterally compatible with chronic medical renal disease.  Suspect 8 mm right renal pelvic stone. No hydronephrosis. Electronically Signed   By: Rolm Baptise M.D.   On: 10/23/2017 19:13     ____________________________________________   PROCEDURES  Procedure(s) performed: None Procedures Critical Care performed:  None ____________________________________________   INITIAL IMPRESSION / ASSESSMENT AND PLAN / ED COURSE  57 y.o. female with h/o ESRD on HD (MWF), and frequent visits for various different pain complaints who presents from HD for R flank pain since earlier today. Based on my exam I am's patient is for musculoskeletal pain as pain is reproducible when she twists her torso and also with palpation of the muscles of the right mid back area however she does have some mild tenderness on the right upper quadrant as well. She has no signs of an upper respiratory infection or pneumonia. Her gallbladder has been removed several years ago. Patient does have a history of kidney stones therefore I will send her for a renal ultrasound to rule out hydronephrosis or any obvious stones. I will treat her pain with IV Robaxin, lidocaine patch and Tylenol. We'll check basic labs since her dialysis treatment ended 30 minutes shorter. Low clinical suspicion of PE with pain reproducible on palpation, no shortness of breath, no chest pain, no tachycardia, no tachypnea, no hypoxia, no asymmetric leg swelling.  Clinical Course as of Oct 24 45  Fri Oct 23, 2017  2226 Labs and imaging were no acute findings. No indications for emergent dialysis. The pain is significantly improved with lidocaine patch. At this time is clinical suspicion is that this is musculoskeletal in nature. Patient can be discharged home on muscle relaxants and Tylenol. Discussed return precautions and close follow-up with primary care doctor.  [CV]    Clinical Course User Index [CV] Alfred Levins [place], MD     As part of my medical decision making, I reviewed the following data within the  [facility] notes reviewed and incorporated, Labs reviewed , EKG interpreted , Radiograph reviewed , Notes from prior ED visits and [place] Controlled Substance Database    Pertinent labs & imaging results that were available during my care of the patient were reviewed by me and considered in my medical decision making (see chart for details).    ____________________________________________   FINAL CLINICAL IMPRESSION(S) / ED DIAGNOSES  Final diagnoses:  Right flank pain      NEW MEDICATIONS STARTED DURING THIS VISIT:  ED Discharge Orders        Ordered    methocarbamol (ROBAXIN) 500 MG tablet  4 times daily     10/23/17 2224       Note:  This document was prepared using Dragon voice recognition software and may include unintentional dictation errors.    Alfred Levins, [place], MD 10/24/17 [phone]

## 2017-10-23 DEATH — deceased

## 2017-10-27 ENCOUNTER — Emergency Department: Payer: Medicare Other

## 2017-10-27 ENCOUNTER — Emergency Department
Admission: EM | Admit: 2017-10-27 | Discharge: 2017-10-27 | Disposition: A | Discharge status: Home / Self Care | Payer: Medicare Other | Attending: Emergency Medicine | Admitting: Emergency Medicine

## 2017-10-27 ENCOUNTER — Encounter: Payer: Self-pay | Admitting: Emergency Medicine

## 2017-10-27 DIAGNOSIS — I252 Old myocardial infarction: Secondary | ICD-10-CM | POA: Diagnosis not present

## 2017-10-27 DIAGNOSIS — F329 Major depressive disorder, single episode, unspecified: Secondary | ICD-10-CM | POA: Insufficient documentation

## 2017-10-27 DIAGNOSIS — J449 Chronic obstructive pulmonary disease, unspecified: Secondary | ICD-10-CM | POA: Insufficient documentation

## 2017-10-27 DIAGNOSIS — N186 End stage renal disease: Secondary | ICD-10-CM | POA: Insufficient documentation

## 2017-10-27 DIAGNOSIS — E1122 Type 2 diabetes mellitus with diabetic chronic kidney disease: Secondary | ICD-10-CM | POA: Diagnosis not present

## 2017-10-27 DIAGNOSIS — R1084 Generalized abdominal pain: Secondary | ICD-10-CM | POA: Diagnosis not present

## 2017-10-27 DIAGNOSIS — R1030 Lower abdominal pain, unspecified: Secondary | ICD-10-CM | POA: Diagnosis present

## 2017-10-27 DIAGNOSIS — R112 Nausea with vomiting, unspecified: Secondary | ICD-10-CM | POA: Diagnosis not present

## 2017-10-27 DIAGNOSIS — Z9049 Acquired absence of other specified parts of digestive tract: Secondary | ICD-10-CM | POA: Insufficient documentation

## 2017-10-27 DIAGNOSIS — J45909 Unspecified asthma, uncomplicated: Secondary | ICD-10-CM | POA: Insufficient documentation

## 2017-10-27 DIAGNOSIS — I5032 Chronic diastolic (congestive) heart failure: Secondary | ICD-10-CM | POA: Diagnosis not present

## 2017-10-27 DIAGNOSIS — F419 Anxiety disorder, unspecified: Secondary | ICD-10-CM | POA: Insufficient documentation

## 2017-10-27 DIAGNOSIS — Z992 Dependence on renal dialysis: Secondary | ICD-10-CM | POA: Insufficient documentation

## 2017-10-27 DIAGNOSIS — I132 Hypertensive heart and chronic kidney disease with heart failure and with stage 5 chronic kidney disease, or end stage renal disease: Secondary | ICD-10-CM | POA: Insufficient documentation

## 2017-10-27 DIAGNOSIS — Z85528 Personal history of other malignant neoplasm of kidney: Secondary | ICD-10-CM | POA: Diagnosis not present

## 2017-10-27 DIAGNOSIS — I2510 Atherosclerotic heart disease of native coronary artery without angina pectoris: Secondary | ICD-10-CM | POA: Insufficient documentation

## 2017-10-27 DIAGNOSIS — Z87891 Personal history of nicotine dependence: Secondary | ICD-10-CM | POA: Diagnosis not present

## 2017-10-27 LAB — CBC WITH DIFFERENTIAL/PLATELET
Basophils Absolute: 0.1 10*3/uL (ref 0–0.1)
Basophils Relative: 2 %
Eosinophils Absolute: 0.7 10*3/uL (ref 0–0.7)
Eosinophils Relative: 14 %
HCT: 31.4 % — ABNORMAL LOW (ref 35.0–47.0)
Hemoglobin: 10 g/dL — ABNORMAL LOW (ref 12.0–16.0)
Lymphocytes Relative: 39 %
Lymphs Abs: 2.1 10*3/uL (ref 1.0–3.6)
MCH: 29.6 pg (ref 26.0–34.0)
MCHC: 32 g/dL (ref 32.0–36.0)
MCV: 92.6 fL (ref 80.0–100.0)
Monocytes Absolute: 0.5 10*3/uL (ref 0.2–0.9)
Monocytes Relative: 9 %
Neutro Abs: 1.8 10*3/uL (ref 1.4–6.5)
Neutrophils Relative %: 36 %
Platelets: 125 10*3/uL — ABNORMAL LOW (ref 150–440)
RBC: 3.39 MIL/uL — ABNORMAL LOW (ref 3.80–5.20)
RDW: 16.8 % — ABNORMAL HIGH (ref 11.5–14.5)
WBC: 5.2 10*3/uL (ref 3.6–11.0)

## 2017-10-27 LAB — COMPREHENSIVE METABOLIC PANEL
ALT: 13 U/L — ABNORMAL LOW (ref 14–54)
AST: 28 U/L (ref 15–41)
Albumin: 3.1 g/dL — ABNORMAL LOW (ref 3.5–5.0)
Alkaline Phosphatase: 98 U/L (ref 38–126)
Anion gap: 15 (ref 5–15)
BUN: 50 mg/dL — ABNORMAL HIGH (ref 6–20)
CO2: 24 mmol/L (ref 22–32)
Calcium: 8.1 mg/dL — ABNORMAL LOW (ref 8.9–10.3)
Chloride: 99 mmol/L — ABNORMAL LOW (ref 101–111)
Creatinine, Ser: 8.93 mg/dL — ABNORMAL HIGH (ref 0.44–1.00)
GFR calc Af Amer: 5 mL/min — ABNORMAL LOW (ref >60)
GFR calc non Af Amer: 4 mL/min — ABNORMAL LOW (ref >60)
Glucose, Bld: 80 mg/dL (ref 65–99)
Potassium: 5 mmol/L (ref 3.5–5.1)
Sodium: 138 mmol/L (ref 135–145)
Total Bilirubin: 0.9 mg/dL (ref 0.3–1.2)
Total Protein: 7 g/dL (ref 6.5–8.1)

## 2017-10-27 LAB — TROPONIN I: Troponin I: <0.03 ng/mL (ref <0.03)

## 2017-10-27 LAB — LIPASE, BLOOD: Lipase: 22 U/L (ref 11–51)

## 2017-10-27 MED ORDER — OXYCODONE-ACETAMINOPHEN 5-325 MG PO TABS
ORAL_TABLET | ORAL | Status: AC
  Administered 2017-10-27: 2 via ORAL
  Filled 2017-10-27: qty 2

## 2017-10-27 MED ORDER — PROMETHAZINE HCL 25 MG/ML IJ SOLN
25.0000 mg | Freq: Once | INTRAMUSCULAR | Status: AC
  Administered 2017-10-27: 25 mg via INTRAVENOUS

## 2017-10-27 MED ORDER — IOPAMIDOL (ISOVUE-300) INJECTION 61%
15.0000 mL | INTRAVENOUS | Status: AC
  Administered 2017-10-27 (×2): 15 mL via ORAL

## 2017-10-27 MED ORDER — MORPHINE SULFATE (PF) 4 MG/ML IV SOLN
4.0000 mg | Freq: Once | INTRAVENOUS | Status: AC
  Administered 2017-10-27: 4 mg via INTRAVENOUS

## 2017-10-27 MED ORDER — PROMETHAZINE HCL 25 MG/ML IJ SOLN
25.0000 mg | Freq: Once | INTRAMUSCULAR | Status: AC
  Administered 2017-10-27: 25 mg via INTRAMUSCULAR
  Filled 2017-10-27: qty 1

## 2017-10-27 MED ORDER — OXYCODONE-ACETAMINOPHEN 5-325 MG PO TABS
2.0000 | ORAL_TABLET | Freq: Once | ORAL | Status: AC
  Administered 2017-10-27: 2 via ORAL

## 2017-10-27 MED ORDER — MORPHINE SULFATE (PF) 4 MG/ML IV SOLN
INTRAVENOUS | Status: AC
  Filled 2017-10-27: qty 1

## 2017-10-27 MED ORDER — PROMETHAZINE HCL 25 MG/ML IJ SOLN
INTRAMUSCULAR | Status: AC
  Administered 2017-10-27: 25 mg via INTRAVENOUS
  Filled 2017-10-27: qty 1

## 2017-10-27 NOTE — ED Notes (Signed)
Pt has nephew taking her home

## 2017-10-27 NOTE — ED Provider Notes (Signed)
[facility] Emergency Department Provider Note       Time seen: ----------------------------------------- 8:03 AM on 10/27/2017 -----------------------------------------   I have reviewed the triage vital signs and the nursing notes.  HISTORY   Chief Complaint Abdominal Pain    HPI [name] is a 57 y.o. female with a history of anemia, anxiety, asthma, CHF, COPD, diabetes, end-stage renal disease on dialysis, pancreatitis who presents to the ED for abdominal pain and nausea.  Patient arrives alert and oriented, states it feels like something is tearing away from her abdomen in the right side and in the middle lower abdomen.  Pain is 10 out of 10, nothing makes it better or worse.  Patient states she was nauseous yesterday and did not go to dialysis.  Past Medical History:  Diagnosis Date  . Anemia   . Anginal pain ([place])   . Anxiety   . Arthritis   . Asthma   . Broken wrist   . Bronchitis   . chronic diastolic CHF 1/88/4166  . COPD (chronic obstructive pulmonary disease) ([place])   . Coronary artery disease    a. cath 2013: stenting to RCA (report not available); b. cath 2014: LM nl, pLAD 40%, mLAD nl, ost LCx 40%, mid LCx nl, pRCA 30% @ site of prior stent, mRCA 50%  . Depression   . Diabetes mellitus without complication ([place])   . Diabetic neuropathy ([place])   . dialysis 2006  . Diverticulosis   . Dizziness   . Dyspnea   . Elevated lipids   . Environmental and seasonal allergies   . ESRD (end stage renal disease) on dialysis ([place])    M-W-F  . GERD (gastroesophageal reflux disease)   . Headache   . History of hiatal hernia   . HOH (hard of hearing)   . Hx of pancreatitis 2015  . Hypertension   . Lower extremity edema   . Mitral regurgitation    a. echo 10/2013: EF 62%, noWMA, mildly dilated LA, mild to mod MR/TR, GR1DD  . Myocardial infarction ([place])   . Orthopnea   . Pneumonia   . Renal cancer ([place])   . Renal insufficiency    Pt is on  dialysis on M,W + F.  . Wheezing     Patient Active Problem List   Diagnosis Date Noted  . Osteomyelitis ([place]) 09/30/2017  . Carotid stenosis 06/18/2017  . Shortness of breath 05/04/2017  . Cellulitis of lower extremity 07/29/2016  . Chronic venous insufficiency 07/29/2016  . Lymphedema 07/29/2016  . TIA (transient ischemic attack) 04/21/2016  . Altered mental status 04/08/2016  . Hyperammonemia ([place]) 04/08/2016  . Elevated troponin 04/08/2016  . Depression 04/08/2016  . Depression, major, recurrent, severe with psychosis ([place]) 04/08/2016  . Blood in stool   . Intractable cyclical vomiting with nausea   . Reflux esophagitis   . Gastritis   . Generalized abdominal pain   . Uncontrollable vomiting   . Major depressive disorder, recurrent episode, moderate ([place]) 03/15/2016  . Adjustment disorder with mixed anxiety and depressed mood 03/15/2016  . Malnutrition of moderate degree 12/01/2015  . Renal mass   . Dyspnea   . Acute renal failure ([place])   . Respiratory failure ([place])   . High temperature 11/14/2015  . Pulmonary edema   . Encounter for central line placement   . Encounter for orogastric (OG) tube placement   . Nausea 11/12/2015  . Hyperkalemia 10/03/2015  . Diarrhea, unspecified 07/22/2015  . Pneumonia 05/21/2015  .  Hypoglycemia 04/24/2015  . Unresponsiveness 04/24/2015  . Bradycardia 04/24/2015  . Hypothermia 04/24/2015  . Acute respiratory failure ([place]) 04/24/2015  . Acute diastolic CHF (congestive heart failure) ([place]) 04/05/2015  . Diabetic gastroparesis ([place]) 04/05/2015  . Hypokalemia 04/05/2015  . Generalized weakness 04/05/2015  . Acute pulmonary edema ([place]) 04/03/2015  . Nausea and vomiting 04/03/2015  . Hypoglycemia associated with diabetes ([place]) 04/03/2015  . Anemia of chronic disease 04/03/2015  . Secondary hyperparathyroidism ([place]) 04/03/2015  . Pressure ulcer 04/02/2015  . Acute respiratory failure with hypoxia ([place]) 04/01/2015  . Adjustment  disorder with anxiety 03/14/2015  . Somatic symptom disorder, mild 03/08/2015  . Coronary artery disease involving native coronary artery of native heart without angina pectoris   . Nausea & vomiting 03/06/2015  . Abdominal pain 03/06/2015  . DM (diabetes mellitus) ([place]) 03/06/2015  . HTN (hypertension) 03/06/2015  . Gastroparesis 02/24/2015  . Pleural effusion 02/19/2015  . HCAP (healthcare-associated pneumonia) 02/19/2015  . End-stage renal disease on hemodialysis ([place]) 02/19/2015    Past Surgical History:  Procedure Laterality Date  . ABDOMINAL HYSTERECTOMY  1992  . AMPUTATION TOE Left 10/02/2017   Procedure: AMPUTATION TOE-LEFT GREAT TOE;  Surgeon: Albertine Patricia, DPM;  Location: [facility] ORS;  Service: Podiatry;  Laterality: Left;  . APPENDECTOMY    . CARDIAC CATHETERIZATION Left 07/26/2015   Procedure: Left Heart Cath and Coronary Angiography;  Surgeon: Dionisio David, MD;  Location: [place] [facility];  Service: Cardiovascular;  Laterality: Left;  . CATARACT EXTRACTION W/ INTRAOCULAR LENS IMPLANT Right   . CATARACT EXTRACTION W/PHACO Left 03/10/2017   Procedure: CATARACT EXTRACTION PHACO AND INTRAOCULAR LENS PLACEMENT (IOC);  Surgeon: Birder Robson, MD;  Location: [facility] ORS;  Service: Ophthalmology;  Laterality: Left;  [place] 00:51.9 AP% 14.2 CDE 7.39 fluid pack lot # [id number]  . CHOLECYSTECTOMY    . COLONOSCOPY WITH PROPOFOL N/A 08/12/2016   Procedure: COLONOSCOPY WITH PROPOFOL;  Surgeon: Lollie Sails, MD;  Location: [facility];  Service: Endoscopy;  Laterality: N/A;  . DIALYSIS FISTULA CREATION Left    upper arm  . dialysis grafts    . ESOPHAGOGASTRODUODENOSCOPY N/A 03/08/2015   Procedure: ESOPHAGOGASTRODUODENOSCOPY (EGD);  Surgeon: Manya Silvas, MD;  Location[facility] ENDOSCOPY;  Service: Endoscopy;  Laterality: N/A;  . ESOPHAGOGASTRODUODENOSCOPY (EGD) WITH PROPOFOL N/A 03/18/2016   Procedure: ESOPHAGOGASTRODUODENOSCOPY (EGD) WITH PROPOFOL;  Surgeon: [name], MD;   Location: [facility] ENDOSCOPY;  Service: Endoscopy;  Laterality: N/A;  . EYE SURGERY Right 2018  . FECAL TRANSPLANT N/A 08/23/2015   Procedure: FECAL TRANSPLANT;  Surgeon: Manya Silvas, MD;  Location: [facility] ENDOSCOPY;  Service: Endoscopy;  Laterality: N/A;  . HAND SURGERY Bilateral   . PERIPHERAL VASCULAR CATHETERIZATION N/A 12/20/2015   Procedure: Thrombectomy of dialysis access versus permcath placement;  Surgeon: Algernon Huxley, MD;  Location: [place] [facility];  Service: Cardiovascular;  Laterality: N/A;  . PERIPHERAL VASCULAR CATHETERIZATION N/A 12/20/2015   Procedure: A/V Shunt Intervention;  Surgeon: Algernon Huxley, MD;  Location: [place] [facility];  Service: Cardiovascular;  Laterality: N/A;  . PERIPHERAL VASCULAR CATHETERIZATION N/A 12/20/2015   Procedure: A/V Shuntogram/Fistulagram;  Surgeon: Algernon Huxley, MD;  Location: [place] [facility];  Service: Cardiovascular;  Laterality: N/A;  . PERIPHERAL VASCULAR CATHETERIZATION N/A 01/02/2016   Procedure: A/V Shuntogram/Fistulagram;  Surgeon: Algernon Huxley, MD;  Location: [place] [facility] LAB;  Service: Cardiovascular;  Laterality: N/A;  . PERIPHERAL VASCULAR CATHETERIZATION N/A 01/02/2016   Procedure: A/V Shunt Intervention;  Surgeon: Algernon Huxley,  MD;  Location: [place] [facility] LAB;  Service: Cardiovascular;  Laterality: N/A;    Allergies Ace inhibitors; Ativan [lorazepam]; Compazine [prochlorperazine edisylate]; Sumatriptan succinate; Dilaudid [hydromorphone hcl]; Ondansetron; Zofran [ondansetron hcl]; Codeine; Gabapentin; Lac bovis; Losartan; Oxycodone; Prochlorperazine; Reglan [metoclopramide]; Scopolamine; Tape; and Tapentadol  Social History Social History   Tobacco Use  . Smoking status: Former Smoker    Packs/day: 0.50    Years: 40.00    Pack years: 20.00    Types: Cigarettes    Last attempt to quit: 02/13/2015    Years since quitting: 2.7  . Smokeless tobacco: Never Used  Substance Use Topics  . Alcohol use: Yes     Alcohol/week: 0.6 oz    Types: 1 Glasses of wine per week    Comment: glass wine week per pt  . Drug use: Yes    Types: Marijuana    Comment: once a day    Review of Systems Constitutional: Negative for fever. Cardiovascular: Negative for chest pain. Respiratory: Negative for shortness of breath. Gastrointestinal: Positive for abdominal pain and nausea Musculoskeletal: Negative for back pain. Skin: Negative for rash. Neurological: Negative for headaches, positive for weakness  All systems negative/normal/unremarkable except as stated in the HPI  ____________________________________________   PHYSICAL EXAM:  VITAL SIGNS: ED Triage Vitals  Enc Vitals Group     BP --      Pulse Rate 10/27/17 0741 71     Resp 10/27/17 0741 18     Temp 10/27/17 0741 97.9 F (36.6 C)     Temp Source 10/27/17 0741 Oral     SpO2 10/27/17 0741 98 %     Weight 10/27/17 0740 174 lb (78.9 kg)     Height 10/27/17 0740 5\' 5"  (1.651 m)     Head Circumference --      Peak Flow --      Pain Score 10/27/17 0739 10     Pain Loc --      Pain Edu? --      Excl. in [place]? --     Constitutional: Alert and oriented.  Mild distress Eyes: Conjunctivae are normal. Normal extraocular movements. ENT   Head: Normocephalic and atraumatic.   Nose: No congestion/rhinnorhea.   Mouth/Throat: Mucous membranes are moist.   Neck: No stridor. Cardiovascular: Normal rate, regular rhythm. No murmurs, rubs, or gallops. Respiratory: Normal respiratory effort without tachypnea nor retractions. Breath sounds are clear and equal bilaterally. No wheezes/rales/rhonchi. Gastrointestinal: Midline and lower abdominal tenderness, no rebound or guarding.  Normal bowel sounds. Musculoskeletal: Nontender with normal range of motion in extremities. No lower extremity tenderness nor edema. Neurologic:  Normal speech and language. No gross focal neurologic deficits are appreciated.  Skin:  Skin is warm, dry and intact. No  rash noted. Psychiatric: Depressed mood and affect ____________________________________________  EKG: Interpreted by me.  Sinus rhythm rate 71 bpm, nonspecific T wave abnormalities, long QT.  ____________________________________________  ED COURSE:  As part of my medical decision making, I reviewed the following data within the [place] History obtained from family if available, nursing notes, old chart and ekg, as well as notes from prior ED visits. Patient presented for abdominal pain and nausea, we will assess with labs and imaging as indicated at this time.   Procedures ____________________________________________   LABS (pertinent positives/negatives)  Labs Reviewed  CBC WITH DIFFERENTIAL/PLATELET - Abnormal; Notable for the following components:      Result Value   RBC 3.39 (*)    Hemoglobin 10.0 (*)  HCT 31.4 (*)    RDW 16.8 (*)    Platelets 125 (*)    All other components within normal limits  COMPREHENSIVE METABOLIC PANEL - Abnormal; Notable for the following components:   Chloride 99 (*)    BUN 50 (*)    Creatinine, Ser 8.93 (*)    Calcium 8.1 (*)    Albumin 3.1 (*)    ALT 13 (*)    GFR calc non Af Amer 4 (*)    GFR calc Af Amer 5 (*)    All other components within normal limits  LIPASE, BLOOD  TROPONIN I    RADIOLOGY Images were viewed by me  CT of the abdomen and pelvis IMPRESSION: Small bilateral pleural effusions.  No acute findings in the abdomen or pelvis.  Scattered colonic diverticulosis, most pronounced in the left colon. No active diverticulitis. ____________________________________________  DIFFERENTIAL DIAGNOSIS   Gastroparesis, pancreatitis, chronic pain, dehydration, electrolyte abnormality, viral illness, peptic ulcer disease, dissection, AAA  FINAL ASSESSMENT AND PLAN  Abdominal pain, nausea   Plan: Patient had presented for abdominal pain and nausea. Patient's labs were grossly unremarkable for her.  Patient's imaging did not reveal any acute process in the abdomen and pelvis.  She is cleared for outpatient follow-up.   Laurence Aly, MD   Note: This note was generated in part or whole with voice recognition software. Voice recognition is usually quite accurate but there are transcription errors that can and very often do occur. I apologize for any typographical errors that were not detected and corrected.     Earleen Newport, MD 10/27/17 [phone]

## 2017-10-27 NOTE — ED Notes (Signed)
Esign not working pt verbalized discharge instructions and has no questions at this time 

## 2017-10-27 NOTE — ED Triage Notes (Signed)
PT arrived via ems from home with complaints of abdominal pain and nausea. Upon arrival to ED pt alert and oriented x4

## 2017-11-05 ENCOUNTER — Ambulatory Visit (INDEPENDENT_AMBULATORY_CARE_PROVIDER_SITE_OTHER): Payer: Medicare Other | Admitting: Vascular Surgery

## 2017-11-07 IMAGING — CT CT ANGIO NECK
2 of 7 series · 8 of 33 positions shown · IV contrast (APPLIED)
Comparison: Carotid ultrasound 04/22/2016

CLINICAL DATA: Carotid stenosis.

EXAM:
CT ANGIOGRAPHY NECK
TECHNIQUE: Multidetector CT imaging of the neck was performed using the
standard protocol during bolus administration of intravenous
contrast. Multiplanar CT image reconstructions and MIPs were
obtained to evaluate the vascular anatomy. Carotid stenosis
measurements (when applicable) are obtained utilizing NASCET
criteria, using the distal internal carotid diameter as the
denominator.
CONTRAST:  75 mL Isovue 370

[Series 4: cta neck · axial · 0.41mm/px · z∈[+1068,+1150]mm · 2 of 124 slices shown]
[im 42/124  soft-tissue]
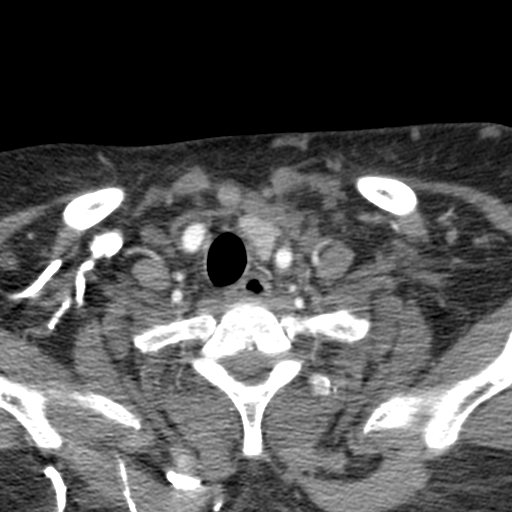
[im 83/124  soft-tissue]
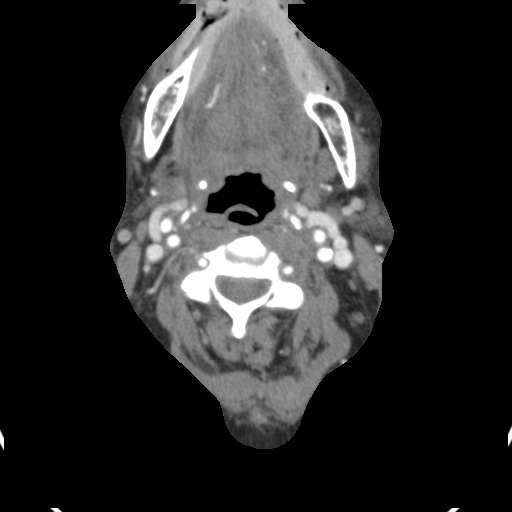

[Series 6: ax thin · axial · 0.43mm/px · z∈[+1005,+1184]mm · 6 of 255 slices shown]
[im 37/255  soft-tissue]
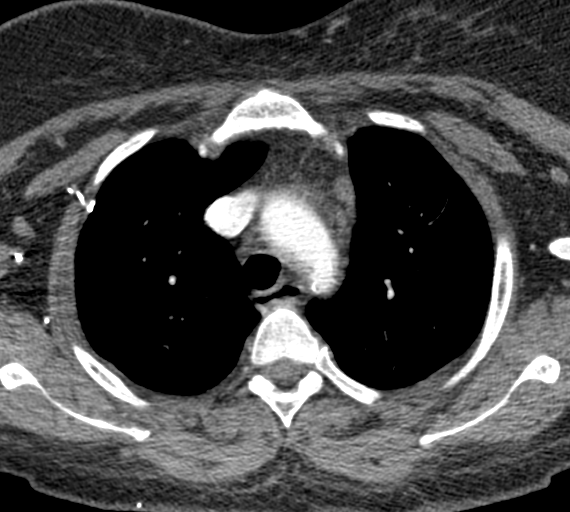
[im 73/255  bone]
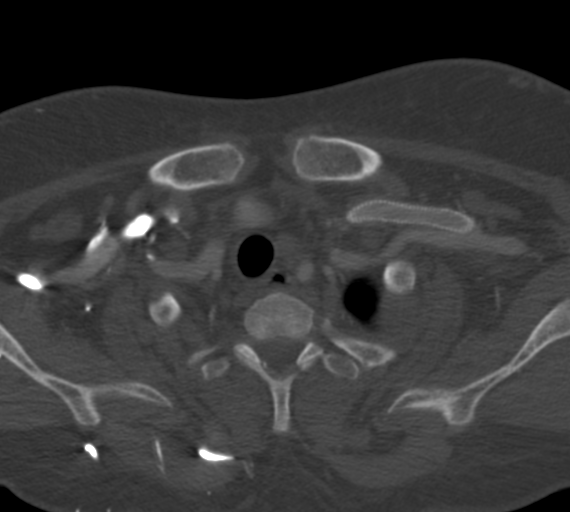
[im 109/255  soft-tissue]
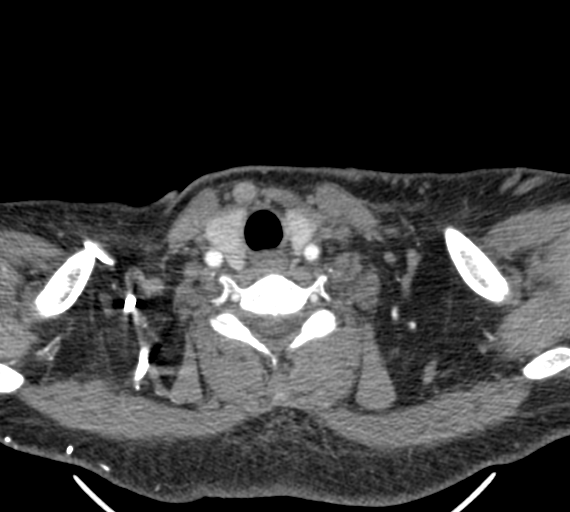
[im 146/255  bone]
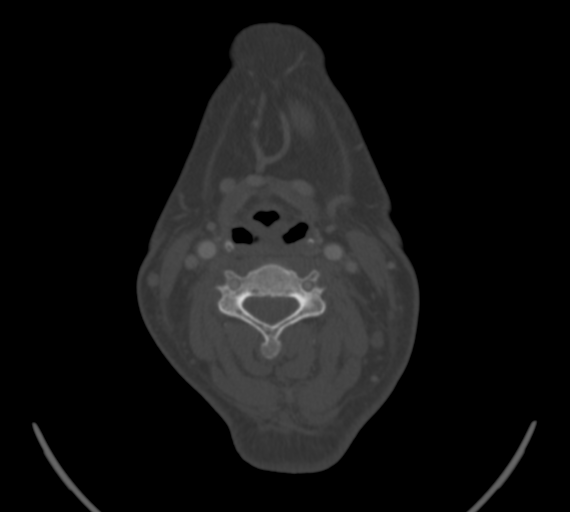
[im 182/255  soft-tissue]
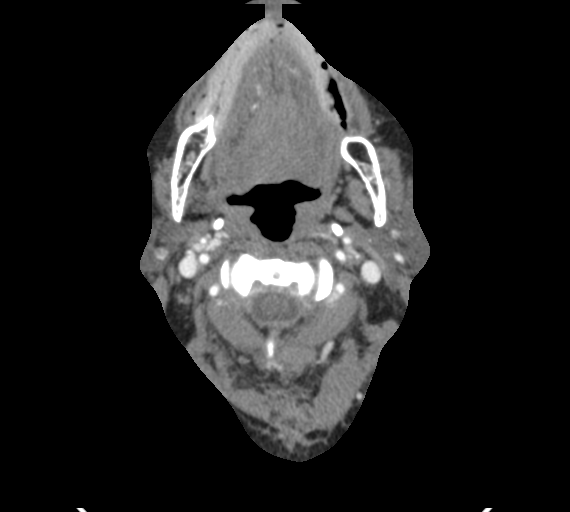
[im 218/255  bone]
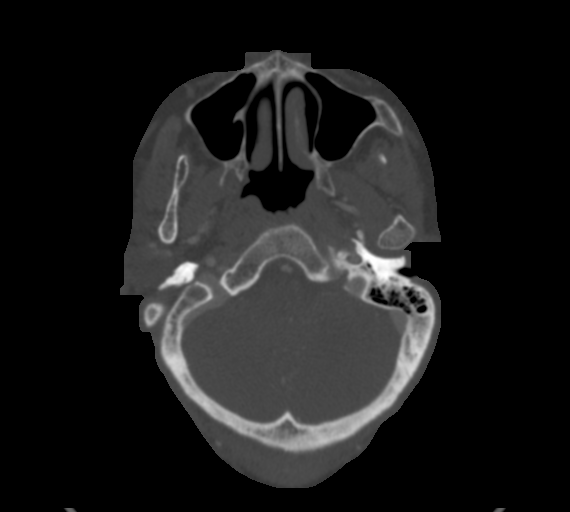

[8 of 33 positions shown; findings below may reference images not displayed]

FINDINGS: Aortic arch: There is no calcific atherosclerosis of the aortic
arch. There is no aneurysm, dissection or hemodynamically
significant stenosis of the visualized ascending aorta and aortic
arch. Conventional 3 vessel aortic branching pattern. The visualized
proximal subclavian arteries are normal.

Right carotid system: The right common carotid origin is widely
patent. There is no common carotid or internal carotid artery
dissection or aneurysm. There is predominantly noncalcified plaque
at the carotid bifurcation at causes approximately 50-60 percent
stenosis of the origin of the right internal carotid artery. There
is calcification of the right internal carotid artery at the
skullbase without high-grade stenosis.

Left carotid system: The left common carotid origin is widely
patent. There is no common carotid or internal carotid artery
dissection or aneurysm. Mixed calcified and noncalcified plaque at
the left carotid bifurcation without hemodynamically significant
stenosis. There is atherosclerotic calcification of the left
internal carotid artery at the skullbase without high-grade
stenosis.

Vertebral arteries: The vertebral system is codominant. Both
vertebral artery origins are normal. Both vertebral arteries are
normal to their confluence with the basilar artery.

Skeleton: There is no bony spinal canal stenosis. No lytic or
blastic lesions.

Other neck: The nasopharynx is clear. The oropharynx and hypopharynx
are normal. The epiglottis is normal. The supraglottic larynx,
glottis and subglottic larynx are normal. No retropharyngeal
collection. The parapharyngeal spaces are preserved. The parotid and
submandibular glands are normal. No sialolithiasis or salivary
ductal dilatation. The thyroid gland is normal. There is no cervical
lymphadenopathy.

Upper chest: No pneumothorax or pleural effusion. No nodules or
masses.

Review of the MIP images confirms the above findings
IMPRESSION: 1. Approximately 50-60 percent stenosis of the proximal right
internal carotid artery secondary to predominantly noncalcified
plaque.
2. No hemodynamically significant stenosis of the left internal
carotid artery.

## 2017-11-19 IMAGING — CT CT WRIST*L* W/O CM
3 of 4 series · 12 of 35 positions shown, 14 images · non-contrast
Comparison: Radiographs from 02/19/2017

CLINICAL DATA: Wrist fracture in February 2017. Poor healing. Lateral
wrist pain. History of prior carpal tunnel surgery.

EXAM:
CT OF THE LEFT WRIST WITHOUT CONTRAST
TECHNIQUE: Multidetector CT imaging was performed according to the standard
protocol. Multiplanar CT image reconstructions were also generated.

[Series 5: cor bone · coronal · 0.26mm/px · 1 of 77 slices shown]
[im 39/77  bone]
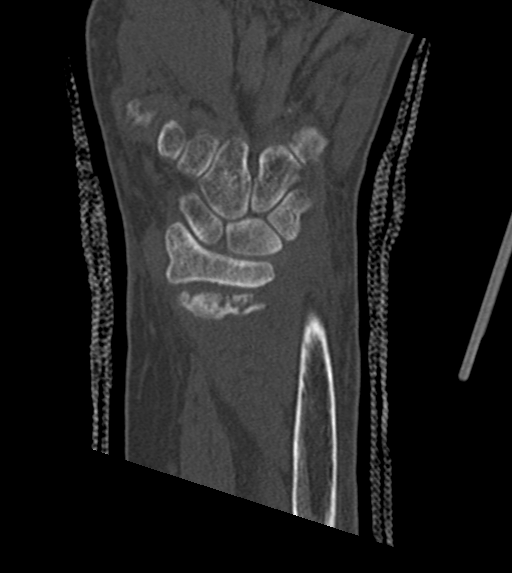

[Series 6: sag bone · sagittal · 0.25mm/px · 6 of 95 slices shown]
[im 32/95  bone]
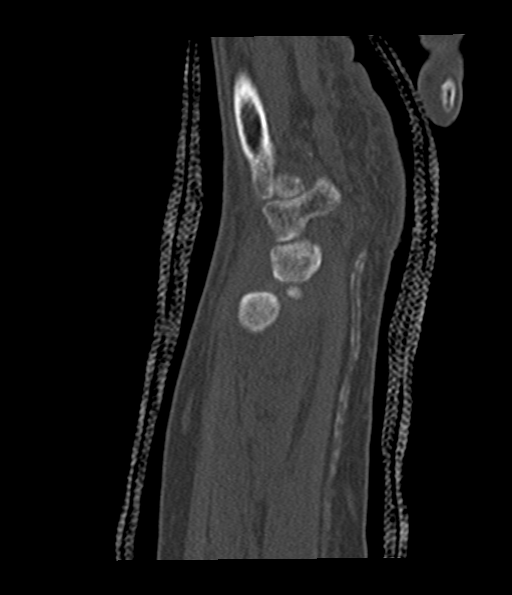
[im 40/95  bone]
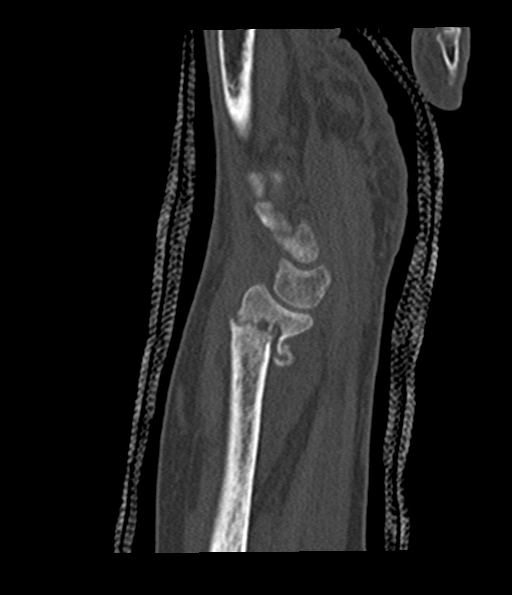
[im 48/95  bone]
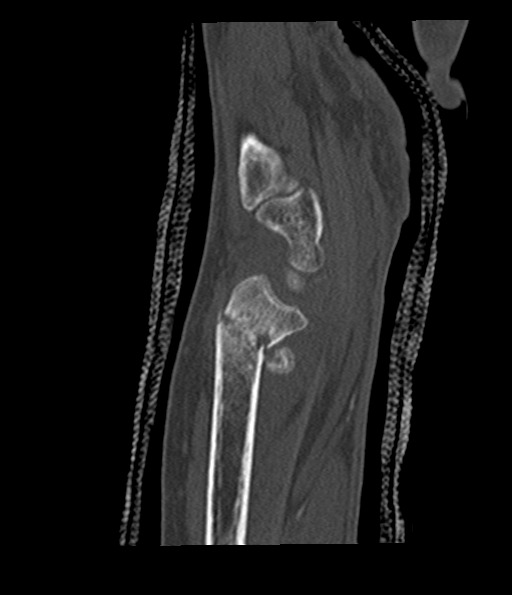
[im 55/95  bone]
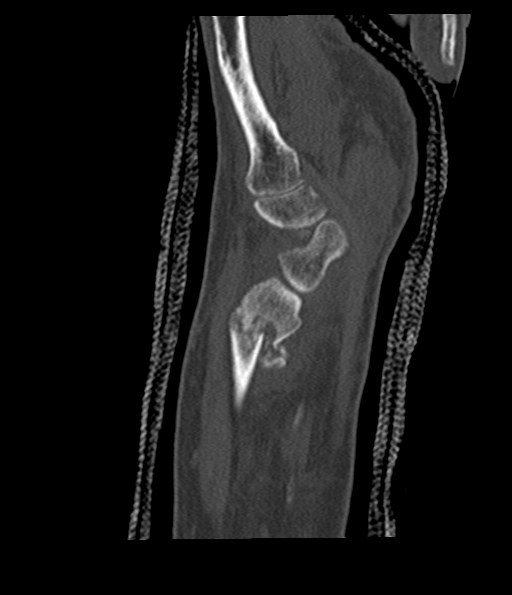
[im 63/95  bone]
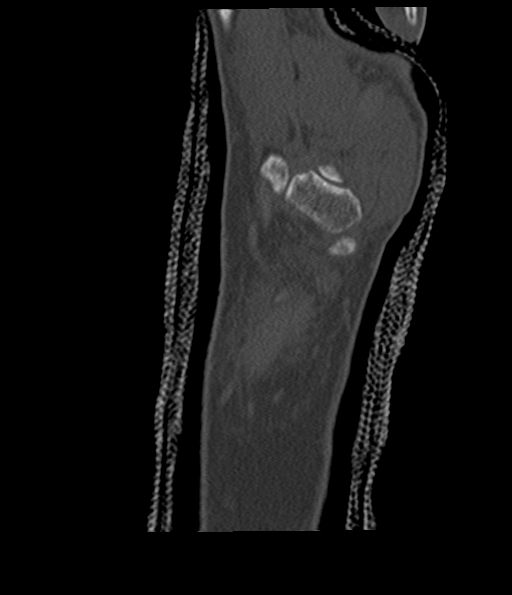
[im 78/95  soft-tissue]
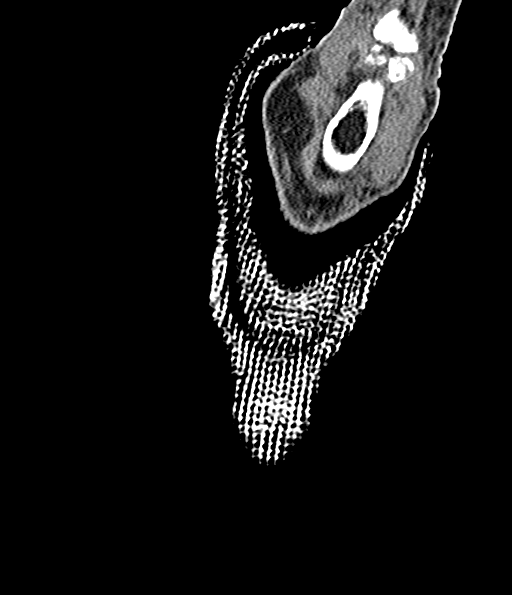

[Series 7: axial st · axial · 0.21mm/px · z∈[+17,+104]mm · 5 of 133 slices shown, 7 images]
[im 21/133  soft-tissue]
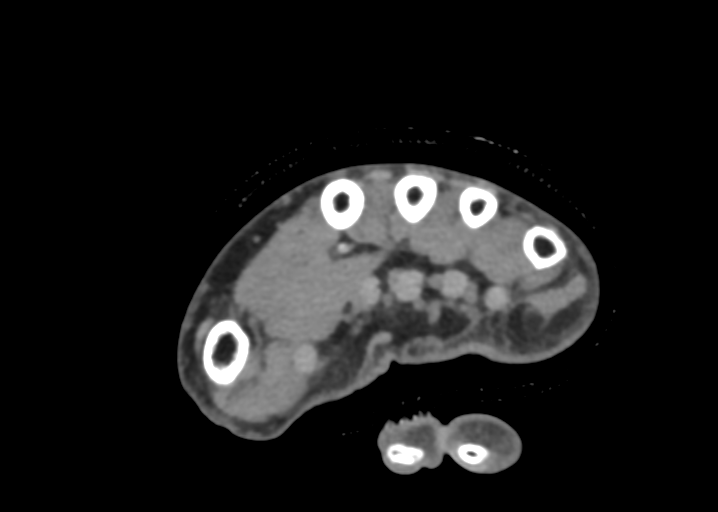
[im 21/133  bone]
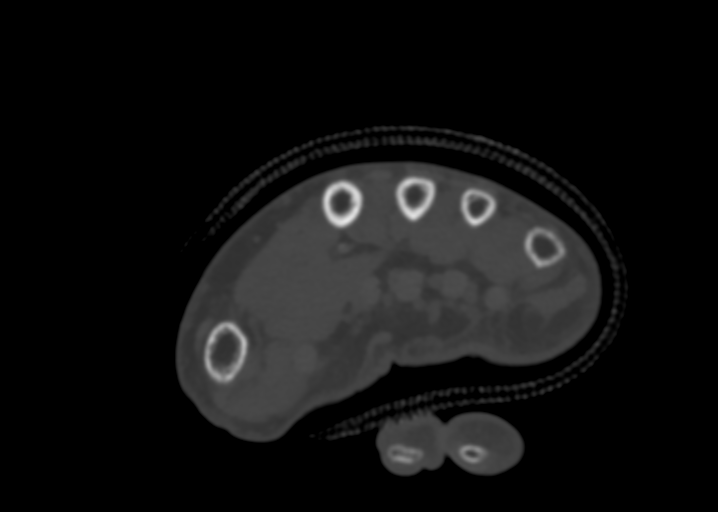
[im 41/133  bone]
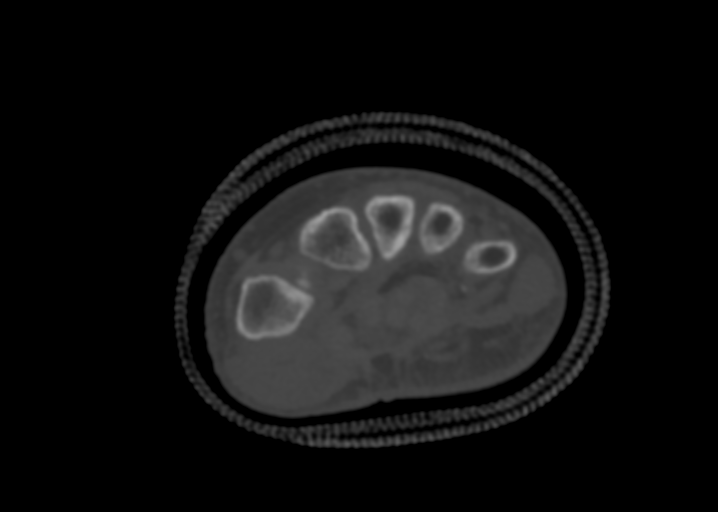
[im 72/133  bone]
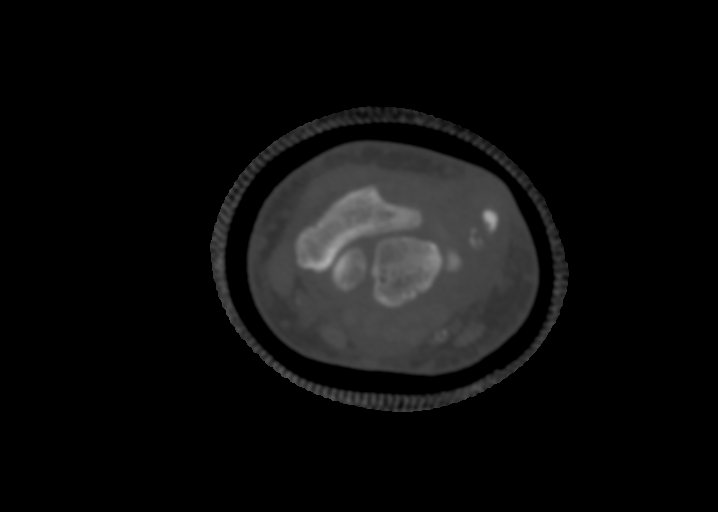
[im 92/133  bone]
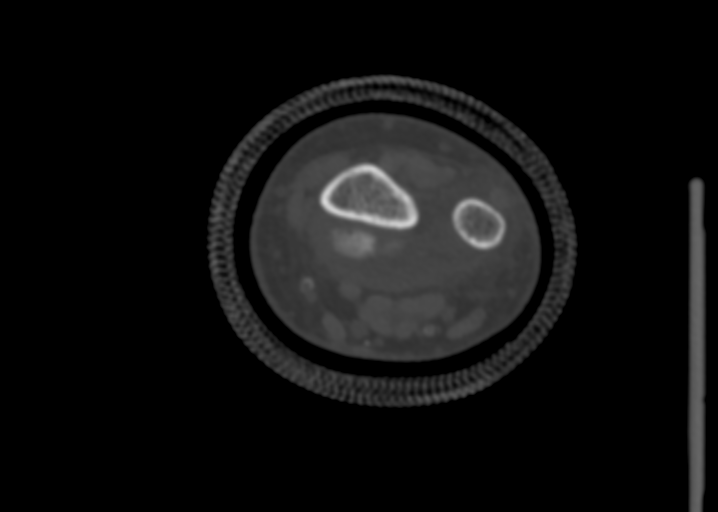
[im 112/133  soft-tissue]
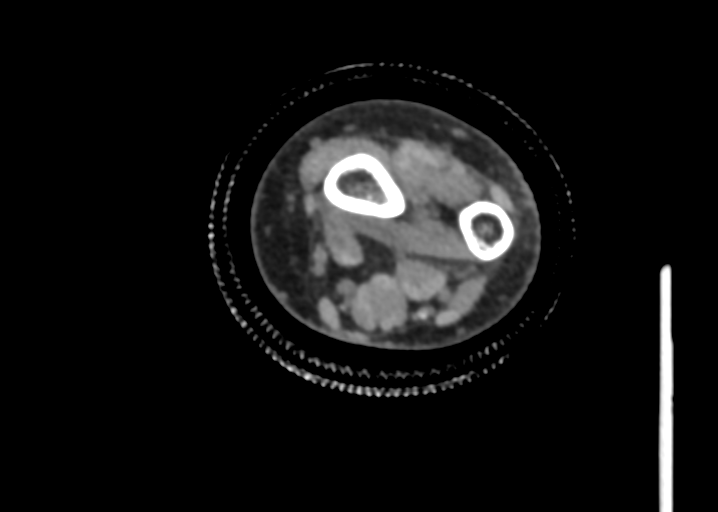
[im 112/133  bone]
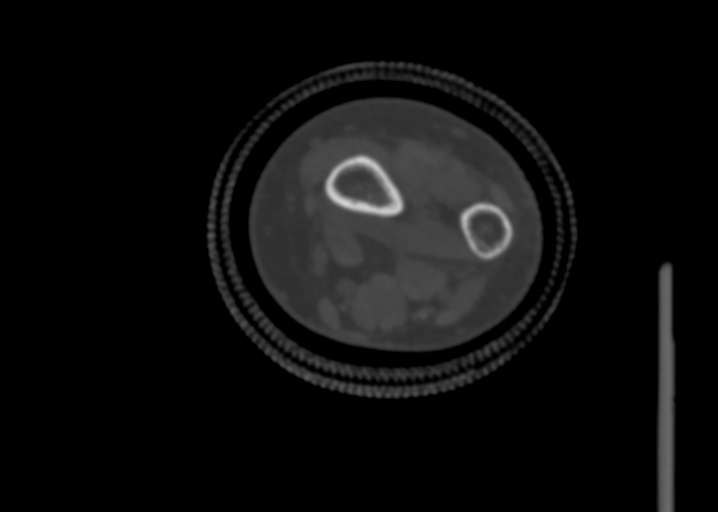

[12 of 35 positions shown; findings below may reference images not displayed]

FINDINGS: Bones/Joint/Cartilage

Transverse fracture of the distal radial metaphysis with 43 degrees
apex posterior angulation. There is some faint sclerosis along the
transverse fracture margins but no true cortication to suggest overt
nonunion at this time. However, there is also no bony bridging.

Anterior to the fracture site there is a bandlike 0.8 by 0.5 by
cm region of woven bone along the deep surface of the pronator
quadratus muscle. The distal radial fracture does not appear to
involve the distal radial articular surface but does extend into the
radial side of the distal radioulnar joint.

There is a small avulsion fracture of the ulnar styloid. No
scapholunate or lunatotriquetral widening. No appreciable carpal
fracture. The carpometacarpal articulations appear unremarkable.

Atherosclerotic vascular calcifications are present.

Ligaments

Suboptimally assessed by CT.

Muscles and Tendons

Indistinctness of contours around the extensor carpi ulnaris, with
poor definition of the tendon within the ECU subsheath. I cannot
exclude tendon injury or subsheath the image. There is subcutaneous
edema localized in this region for example image 54/7.

The flexor pollicis longus tendon appears slightly thickened and
contour for example on image 110/7, possibly due to tendinopathy.

Soft tissues

Subcutaneous edema overlying the ECU at the level of the distal
radius. Lesser degree of volar subcutaneous edema along the palm,
for example image 88/7.
IMPRESSION: 1. Distal radial metaphyseal transverse fracture with 43 degrees
apex posterior angulation, no true nonunion at this time but no
significant bony bridging. There is a band of woven bone anterior to
the fracture site along the deep surface of the pronator quadratus
muscle.
2. Avulsion fracture of the ulnar styloid.
3. Subcutaneous edema and indistinctness of tissue planes around the
extensor carpi ulnaris in the vicinity of the ECU subsheath. I am
unable to exclude an injury of the ECU warrant subsheath in this
vicinity.
4. Probable mild flexor pollicis longus tendinopathy given the
slightly thickened contour of the tendon distal to the carpal
tunnel.

## 2017-12-03 ENCOUNTER — Encounter (INDEPENDENT_AMBULATORY_CARE_PROVIDER_SITE_OTHER): Payer: Self-pay | Admitting: Vascular Surgery

## 2017-12-03 ENCOUNTER — Ambulatory Visit (INDEPENDENT_AMBULATORY_CARE_PROVIDER_SITE_OTHER): Payer: Medicare Other | Admitting: Vascular Surgery

## 2017-12-03 VITALS — BP 130/56 | HR 62 | Resp 17 | Wt 179.0 lb

## 2017-12-03 DIAGNOSIS — Z992 Dependence on renal dialysis: Secondary | ICD-10-CM

## 2017-12-03 DIAGNOSIS — I6523 Occlusion and stenosis of bilateral carotid arteries: Secondary | ICD-10-CM

## 2017-12-03 DIAGNOSIS — I2510 Atherosclerotic heart disease of native coronary artery without angina pectoris: Secondary | ICD-10-CM

## 2017-12-03 DIAGNOSIS — T829XXS Unspecified complication of cardiac and vascular prosthetic device, implant and graft, sequela: Secondary | ICD-10-CM | POA: Diagnosis not present

## 2017-12-03 DIAGNOSIS — I10 Essential (primary) hypertension: Secondary | ICD-10-CM | POA: Diagnosis not present

## 2017-12-03 DIAGNOSIS — N186 End stage renal disease: Secondary | ICD-10-CM

## 2017-12-04 ENCOUNTER — Encounter (INDEPENDENT_AMBULATORY_CARE_PROVIDER_SITE_OTHER): Payer: Self-pay | Admitting: Vascular Surgery

## 2017-12-04 DIAGNOSIS — T829XXA Unspecified complication of cardiac and vascular prosthetic device, implant and graft, initial encounter: Secondary | ICD-10-CM | POA: Insufficient documentation

## 2017-12-04 NOTE — Progress Notes (Signed)
MRN : [id number]  [name] is a 57 y.o. (1961/03/16) female who presents with chief complaint of  Chief Complaint  Patient presents with  . Follow-up  .  History of Present Illness: The patient is seen for follow up evaluation of carotid stenosis. The carotid stenosis followed by ultrasound but she is overdue for a study.   The patient denies amaurosis fugax. There is no recent history of TIA symptoms or focal motor deficits. There is no prior documented CVA.  The patient is taking enteric-coated aspirin 81 mg daily.  There is no history of migraine headaches. There is no history of seizures.  Currently the patient is maintained via a left brachial axillary graft.   The patient notes a significant increase in bleeding time after decannulation.  The patient has also been informed that there is increased recirculation.    The patient denies hand pain or other symptoms consistent with steal phenomena.  No significant arm swelling.  The patient denies redness or swelling at the access site. The patient denies fever or chills at home or while on dialysis.  The patient has a history of coronary artery disease, no recent episodes of angina or shortness of breath. The patient denies PAD or claudication symptoms. There is a history of hyperlipidemia which is being treated with a statin.      Current Meds  Medication Sig  . acidophilus (RISAQUAD) CAPS capsule Take 1 capsule by mouth daily.  Marland Kitchen albuterol (PROVENTIL HFA;VENTOLIN HFA) 108 (90 Base) MCG/ACT inhaler Inhale into the lungs every 6 (six) hours as needed for wheezing or shortness of breath.  Marland Kitchen albuterol (PROVENTIL) (2.5 MG/3ML) 0.083% nebulizer solution Take 2.5 mg by nebulization every 4 (four) hours as needed for wheezing or shortness of breath.  Marland Kitchen amLODipine (NORVASC) 10 MG tablet Take 10 mg by mouth daily.  Marland Kitchen aspirin EC 81 MG tablet Take 81 mg by mouth daily.  Marland Kitchen atorvastatin (LIPITOR) 20 MG tablet Take 20 mg by mouth at  bedtime.   . cetirizine (ZYRTEC) 10 MG tablet Take 10 mg by mouth daily.   . clopidogrel (PLAVIX) 75 MG tablet Take 1 tablet (75 mg total) by mouth daily.  . cyanocobalamin 1000 MCG tablet Take 1,000 mcg by mouth daily with lunch.   . dexlansoprazole (DEXILANT) 60 MG capsule Take 60 mg by mouth daily.  Marland Kitchen FLUoxetine (PROZAC) 20 MG capsule Take 1 capsule (20 mg total) by mouth daily.  Marland Kitchen glucose 4 GM chewable tablet Chew 3-4 tablets by mouth as needed for low blood sugar (blood sugar less than 60).   Marland Kitchen HYDROcodone-acetaminophen (NORCO/VICODIN) 5-325 MG tablet Take 1 tablet by mouth every 6 (six) hours as needed for moderate pain or severe pain. (Patient taking differently: Take 1 tablet by mouth 3 (three) times daily as needed for moderate pain or severe pain. )  . insulin detemir (LEVEMIR) 100 UNIT/ML injection Inject 0.02 mLs (2 Units total) into the skin 2 (two) times daily. (Patient taking differently: Inject 4 Units into the skin at bedtime. )  . lipase/protease/amylase (CREON) 12000 UNITS CPEP capsule Take 24,000-36,000 Units by mouth 3 (three) times daily with meals. Take 36000 mg in the morning, 24000 midday & 36000 in the evening  . multivitamin (RENA-VIT) TABS tablet Take 1 tablet by mouth daily.   . naloxone (NARCAN) 0.4 MG/ML injection Inject 0.4 mg into the vein as needed.  . nitroGLYCERIN (NITROSTAT) 0.4 MG SL tablet Place 0.4 mg under the tongue every 5 (five)  minutes x 3 doses as needed.  Marland Kitchen OLANZapine (ZYPREXA) 5 MG tablet Take 5 mg by mouth at bedtime.  Marland Kitchen omega-3 acid ethyl esters (LOVAZA) 1 g capsule Take 2 g by mouth daily.   . promethazine (PHENERGAN) 25 MG suppository Place 1 suppository (25 mg total) rectally every 6 (six) hours as needed for nausea.  . promethazine (PHENERGAN) 25 MG tablet Take 0.5 tablets (12.5 mg total) by mouth every 6 (six) hours as needed for nausea or vomiting.  . ranitidine (ZANTAC) 300 MG tablet Take 300 mg by mouth at bedtime. In the afternoon  .  rifaximin (XIFAXAN) 550 MG TABS tablet Take 550 mg by mouth 2 (two) times daily.  . sevelamer carbonate (RENVELA) 800 MG tablet Take 800 mg by mouth 3 (three) times daily with meals.    Past Medical History:  Diagnosis Date  . Anemia   . Anginal pain ([place])   . Anxiety   . Arthritis   . Asthma   . Broken wrist   . Bronchitis   . chronic diastolic CHF 06/12/1940  . COPD (chronic obstructive pulmonary disease) ([place])   . Coronary artery disease    a. cath 2013: stenting to RCA (report not available); b. cath 2014: LM nl, pLAD 40%, mLAD nl, ost LCx 40%, mid LCx nl, pRCA 30% @ site of prior stent, mRCA 50%  . Depression   . Diabetes mellitus without complication ([place])   . Diabetic neuropathy ([place])   . dialysis 2006  . Diverticulosis   . Dizziness   . Dyspnea   . Elevated lipids   . Environmental and seasonal allergies   . ESRD (end stage renal disease) on dialysis ([place])    M-W-F  . GERD (gastroesophageal reflux disease)   . Headache   . History of hiatal hernia   . HOH (hard of hearing)   . Hx of pancreatitis 2015  . Hypertension   . Lower extremity edema   . Mitral regurgitation    a. echo 10/2013: EF 62%, noWMA, mildly dilated LA, mild to mod MR/TR, GR1DD  . Myocardial infarction ([place])   . Orthopnea   . Pneumonia   . Renal cancer ([place])   . Renal insufficiency    Pt is on dialysis on M,W + F.  . Wheezing     Past Surgical History:  Procedure Laterality Date  . ABDOMINAL HYSTERECTOMY  1992  . AMPUTATION TOE Left 10/02/2017   Procedure: AMPUTATION TOE-LEFT GREAT TOE;  Surgeon: Albertine Patricia, DPM;  Location: [facility] ORS;  Service: Podiatry;  Laterality: Left;  . APPENDECTOMY    . CARDIAC CATHETERIZATION Left 07/26/2015   Procedure: Left Heart Cath and Coronary Angiography;  Surgeon: Dionisio David, MD;  Location: [place] [facility];  Service: Cardiovascular;  Laterality: Left;  . CATARACT EXTRACTION W/ INTRAOCULAR LENS IMPLANT Right   . CATARACT EXTRACTION W/PHACO Left  03/10/2017   Procedure: CATARACT EXTRACTION PHACO AND INTRAOCULAR LENS PLACEMENT (IOC);  Surgeon: Birder Robson, MD;  Location: [facility]S;  Service: Ophthalmology;  Laterality: Left;  [place] 00:51.9 AP% 14.2 CDE 7.39 fluid pack lot # [id number]  . CHOLECYSTECTOMY    . COLONOSCOPY WITH PROPOFOL N/A 08/12/2016   Procedure: COLONOSCOPY WITH PROPOFOL;  Surgeon: Lollie Sails, MD;  Location: [facility] ENDOSCOPY;  Service: Endoscopy;  Laterality: N/A;  . DIALYSIS FISTULA CREATION Left    upper arm  . dialysis grafts    . ESOPHAGOGASTRODUODENOSCOPY N/A 03/08/2015   Procedure: ESOPHAGOGASTRODUODENOSCOPY (EGD);  Surgeon: Manya Silvas, MD;  Location: [facility] ENDOSCOPY;  Service: Endoscopy;  Laterality: N/A;  . ESOPHAGOGASTRODUODENOSCOPY (EGD) WITH PROPOFOL N/A 03/18/2016   Procedure: ESOPHAGOGASTRODUODENOSCOPY (EGD) WITH PROPOFOL;  Surgeon: [name], MD;  Location: [facility] ENDOSCOPY;  Service: Endoscopy;  Laterality: N/A;  . EYE SURGERY Right 2018  . FECAL TRANSPLANT N/A 08/23/2015   Procedure: FECAL TRANSPLANT;  Surgeon: Manya Silvas, MD;  Location: [facility] ENDOSCOPY;  Service: Endoscopy;  Laterality: N/A;  . HAND SURGERY Bilateral   . PERIPHERAL VASCULAR CATHETERIZATION N/A 12/20/2015   Procedure: Thrombectomy of dialysis access versus permcath placement;  Surgeon: Algernon Huxley, MD;  Location: [place] [facility];  Service: Cardiovascular;  Laterality: N/A;  . PERIPHERAL VASCULAR CATHETERIZATION N/A 12/20/2015   Procedure: A/V Shunt Intervention;  Surgeon: Algernon Huxley, MD;  Location: [place] [facility];  Service: Cardiovascular;  Laterality: N/A;  . PERIPHERAL VASCULAR CATHETERIZATION N/A 12/20/2015   Procedure: A/V Shuntogram/Fistulagram;  Surgeon: Algernon Huxley, MD;  Location: [place] [facility];  Service: Cardiovascular;  Laterality: N/A;  . PERIPHERAL VASCULAR CATHETERIZATION N/A 01/02/2016   Procedure: A/V Shuntogram/Fistulagram;  Surgeon: Algernon Huxley, MD;  Location: [place] [facility];  Service:  Cardiovascular;  Laterality: N/A;  . PERIPHERAL VASCULAR CATHETERIZATION N/A 01/02/2016   Procedure: A/V Shunt Intervention;  Surgeon: Algernon Huxley, MD;  Location: [place] [facility];  Service: Cardiovascular;  Laterality: N/A;    Social History Social History   Tobacco Use  . Smoking status: Former Smoker    Packs/day: 0.50    Years: 40.00    Pack years: 20.00    Types: Cigarettes    Last attempt to quit: 02/13/2015    Years since quitting: 2.8  . Smokeless tobacco: Never Used  Substance Use Topics  . Alcohol use: Yes    Alcohol/week: 0.6 oz    Types: 1 Glasses of wine per week    Comment: glass wine week per pt  . Drug use: Yes    Types: Marijuana    Comment: once a day    Family History Family History  Problem Relation Age of Onset  . Kidney disease Mother   . Diabetes Mother   . Cancer Father   . Kidney disease Sister     Allergies  Allergen Reactions  . Ace Inhibitors Swelling and Anaphylaxis  . Ativan [Lorazepam] Other (See Comments)    Reaction:Hallucinations and headaches Reaction:Hallucinations and headaches Reaction:  Hallucinations and headaches  . Compazine [Prochlorperazine Edisylate] Anaphylaxis and Nausea And Vomiting    Other reaction(s): dystonia from this vs. reglan 23 Jul - patient relates that she takes promethazine frequently with no problems. 23 Jul - patient relates that she takes promethazine frequently with no problems.  . Sumatriptan Succinate     Other reaction(s): delirium and hallucinations per [facility] records  . Dilaudid [Hydromorphone Hcl] Other (See Comments)    Delirium   . Ondansetron Other (See Comments)    hallucinations    . Zofran [Ondansetron Hcl] Other (See Comments)    Reaction:  hallucinations  Reaction:  hallucinations   . Codeine Nausea And Vomiting  . Gabapentin Other (See Comments)    Reaction:  Unknown    . [place] Nausea And Vomiting  . Losartan Nausea Only  . Oxycodone Anxiety  . Prochlorperazine Other  (See Comments)    Reaction:  Unknown . Patient does not remember reaction but she does have vertigo and anxiety along with n and v at times. Could be used to treat any of these   .  Reglan [Metoclopramide]     Per patient her Dr. Evelina Bucy her off it  Per patient her Dr. Evelina Bucy her off it   . Scopolamine Other (See Comments)    Dizziness, also has vertigo already  . Tape Rash  . Tapentadol Rash     REVIEW OF SYSTEMS (Negative unless checked)  Constitutional: [] Weight loss  [] Fever  [] Chills Cardiac: [] Chest pain   [] Chest pressure   [] Palpitations   [] Shortness of breath when laying flat   [] Shortness of breath with exertion. Vascular:  [] Pain in legs with walking   [] Pain in legs at rest  [] History of DVT   [] Phlebitis   [x] Swelling in legs   [] Varicose veins   [] Non-healing ulcers Pulmonary:   [] Uses home oxygen   [] Productive cough   [] Hemoptysis   [] Wheeze  [] COPD   [] Asthma Neurologic:  [x] Dizziness   [] Seizures   [] History of stroke   [x] History of TIA  [] Aphasia   [] Vissual changes   [] Weakness or numbness in arm   [] Weakness or numbness in leg Musculoskeletal:   [] Joint swelling   [] Joint pain   [] Low back pain Hematologic:  [] Easy bruising  [] Easy bleeding   [] Hypercoagulable state   [] Anemic Gastrointestinal:  [] Diarrhea   [] Vomiting  [] Gastroesophageal reflux/heartburn   [] Difficulty swallowing. Genitourinary:  [x] Chronic kidney disease   [] Difficult urination  [] Frequent urination   [] Blood in urine Skin:  [] Rashes   [] Ulcers  Psychological:  [] History of anxiety   []  History of major depression.  Physical Examination  Vitals:   12/03/17 1340  BP: (!) 130/56  Pulse: 62  Resp: 17  Weight: 179 lb (81.2 kg)   Body mass index is 29.79 kg/m. Gen: WD/WN, NAD Head: [place]/AT, No temporalis wasting.  Ear/Nose/Throat: Hearing grossly intact, nares w/o erythema or drainage Eyes: PER, EOMI, sclera nonicteric.  Neck: Supple, no large masses.   Pulmonary:  Good air movement, no audible  wheezing bilaterally, no use of accessory muscles.  Cardiac: RRR, no JVD Vascular: scattered varicosities present bilaterally.  Mild venous stasis changes to the legs bilaterally.  2+ soft pitting edema Vessel Right Left  Radial Palpable Palpable  Ulnar Palpable Palpable  Brachial Palpable Palpable  Carotid Palpable Palpable  Gastrointestinal: Non-distended. No guarding/no peritoneal signs.  Musculoskeletal: M/S 5/5 throughout.  No deformity or atrophy.  Neurologic: CN 2-12 intact. Symmetrical.  Speech is fluent. Motor exam as listed above. Psychiatric: Judgment intact, Mood & affect appropriate for pt's clinical situation. Dermatologic: mild venous rashes no ulcers noted.  No changes consistent with cellulitis. Lymph : No lichenification or skin changes of chronic lymphedema.  CBC Lab Results  Component Value Date   WBC 5.2 10/27/2017   HGB 10.0 (L) 10/27/2017   HCT 31.4 (L) 10/27/2017   MCV 92.6 10/27/2017   PLT 125 (L) 10/27/2017    BMET    Component Value Date/Time   NA 138 10/27/2017 0828   NA 135 (L) 09/15/2014 0948   K 5.0 10/27/2017 0828   K 4.8 09/15/2014 0948   CL 99 (L) 10/27/2017 0828   CL 99 09/15/2014 0948   CO2 24 10/27/2017 0828   CO2 26 09/15/2014 0948   GLUCOSE 80 10/27/2017 0828   GLUCOSE 118 (H) 09/15/2014 0948   BUN 50 (H) 10/27/2017 0828   BUN 19 (H) 09/15/2014 0948   CREATININE 8.93 (H) 10/27/2017 0828   CREATININE 6.79 (H) 09/15/2014 0948   CALCIUM 8.1 (L) 10/27/2017 0828   CALCIUM 8.3 (L) 09/15/2014 0948   GFRNONAA 4 (  L) 10/27/2017 0828   GFRNONAA 7 (L) 09/15/2014 0948   GFRNONAA 6 (L) 05/31/2014 0432   GFRAA 5 (L) 10/27/2017 0828   GFRAA 8 (L) 09/15/2014 0948   GFRAA 7 (L) 05/31/2014 0432   CrCl cannot be calculated (Patient's most recent lab result is older than the maximum 21 days allowed.).  COAG Lab Results  Component Value Date   INR 0.93 09/30/2017   INR 1.08 04/21/2016   INR 0.97 12/28/2015    Radiology No results  found.   Assessment/Plan 1. Bilateral carotid artery stenosis Recommend:  Given the patient's asymptomatic subcritical stenosis no further invasive testing or surgery at this time.  Duplex ultrasound is overdue.  Continue antiplatelet therapy as prescribed Continue management of CAD, HTN and Hyperlipidemia Healthy heart diet,  encouraged exercise at least 4 times per week Follow up in with duplex ultrasound and physical exam based   - VAS US CAROTID; Future   A total of 35 minutes was spent with this patient and greater than 50% was spent in counseling and coordination of care with the patient.  Discussion included the treatment options for vascular disease including indications for surgery and intervention.  Also discussed is the appropriate timing of treatment.  In addition medical therapy was discussed.  2. Complication of vascular access for dialysis, sequela Recommend:  The patient has bleeding from their dialysis access.  The patient should have a duplex ultrasound of the dialysis at her convenience.  The patient will follow-up with me in the office after each ultrasound   - VAS [place] (AVF, AVG); Future  3. End-stage renal disease on hemodialysis ([place]) Continue dialysis without interuption  4. Essential hypertension Continue antihypertensive medications as already ordered, these medications have been reviewed and there are no changes at this time.   5. Coronary artery disease involving native coronary artery of native heart without angina pectoris Continue cardiac and antihypertensive medications as already ordered and reviewed, no changes at this time.  Continue statin as ordered and reviewed, no changes at this time  Nitrates PRN for chest pain     Hortencia Pilar, MD  12/04/2017 9:59 AM

## 2017-12-05 IMAGING — RF DG ESOPHAGUS
6 series · 14 of 15 positions shown · non-contrast
Comparison: None.

CLINICAL DATA: Dysphagia, coughing, ongoing for 3-4 months.

EXAM:
ESOPHOGRAM / BARIUM SWALLOW / BARIUM TABLET STUDY
TECHNIQUE: Combined double contrast and single contrast examination performed
using effervescent crystals, thick barium liquid, and thin barium
liquid. The patient was observed with fluoroscopy swallowing a 13 mm
barium sulphate tablet.
FLUOROSCOPY TIME:  Fluoroscopy Time:  1.2 minute
Radiation Exposure Index (if provided by the fluoroscopic device):
14.4 mGy
Number of Acquired Spot Images: 0

[Series 1: cp_standard · 0.51mm/px · 4 of 56 frames shown (1 of 6)]
[frame 9/56]
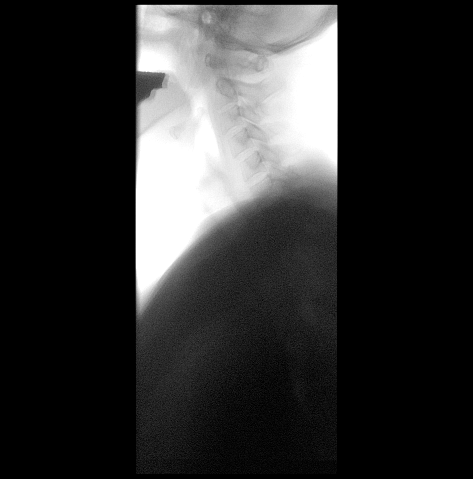
[frame 29/56]
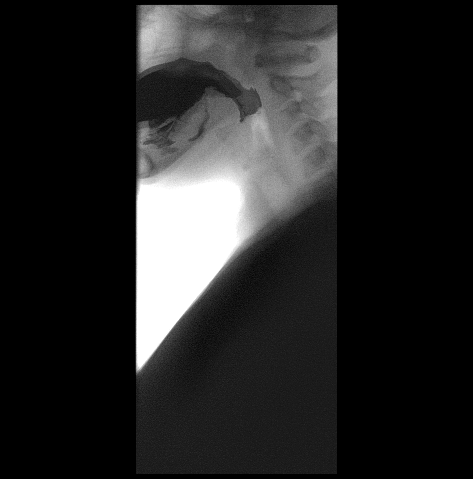
[frame 35/56]
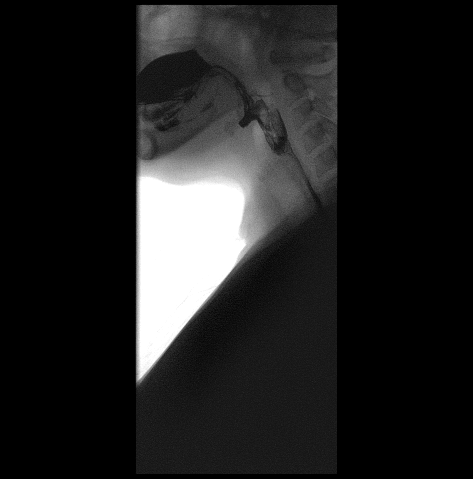
[frame 48/56]
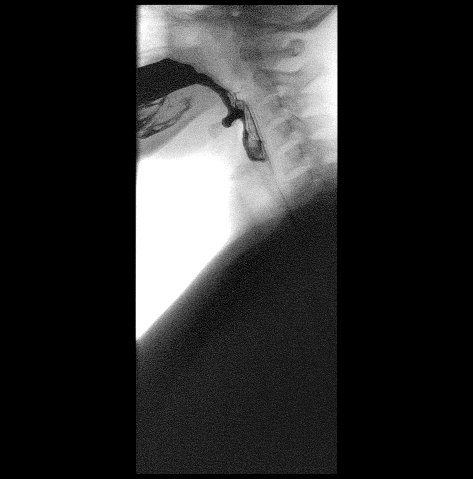

[Series 2: cp_standard · 0.25mm/px · 1 of 1 slices shown (2 of 6)]
[im 1/1]
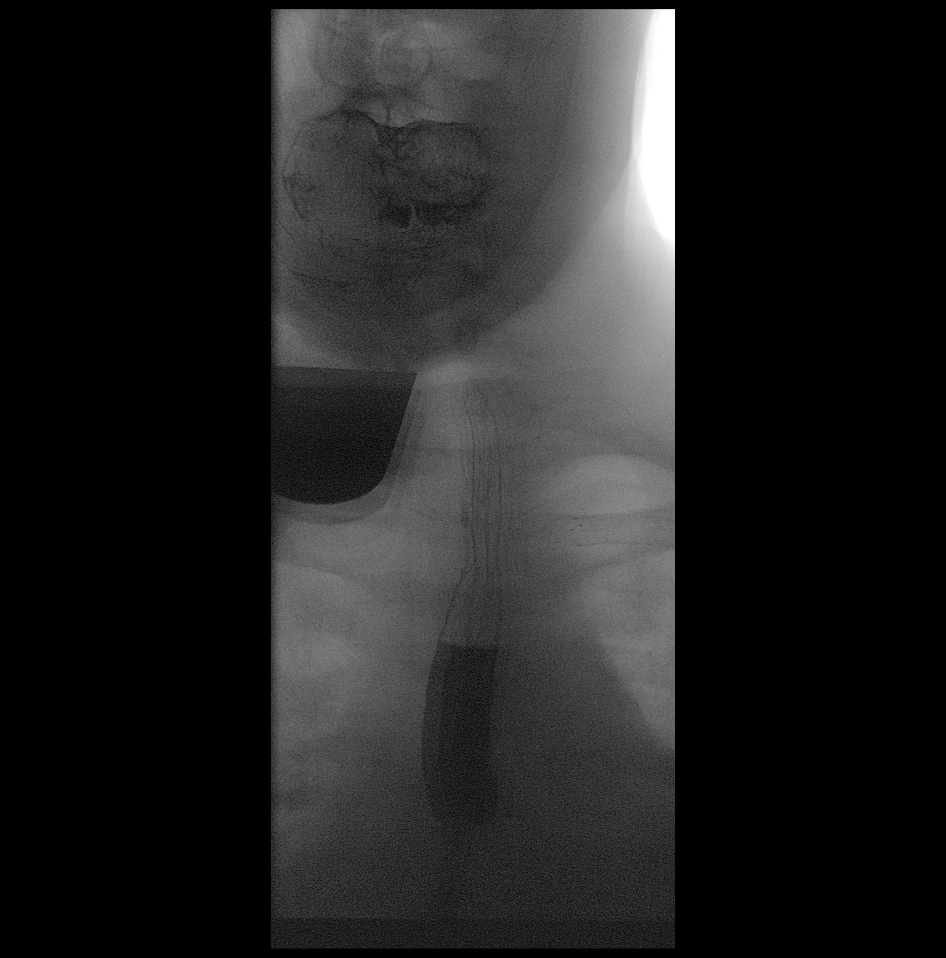

[Series 3: cp_standard · 0.51mm/px · 3 of 54 frames shown (3 of 6)]
[frame 3/54]
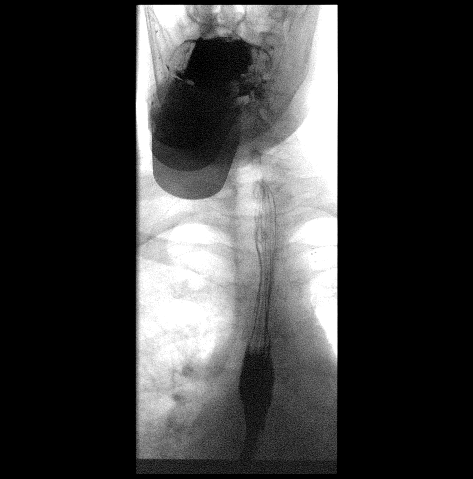
[frame 9/54]
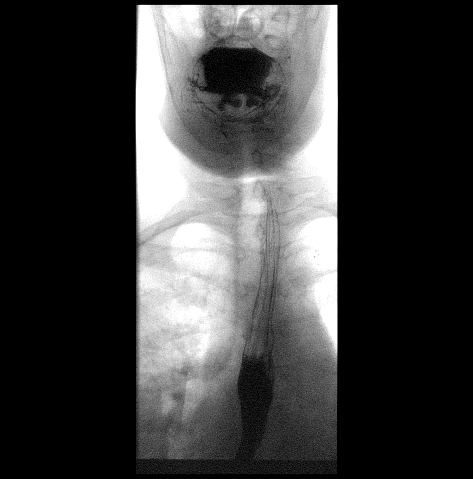
[frame 46/54]
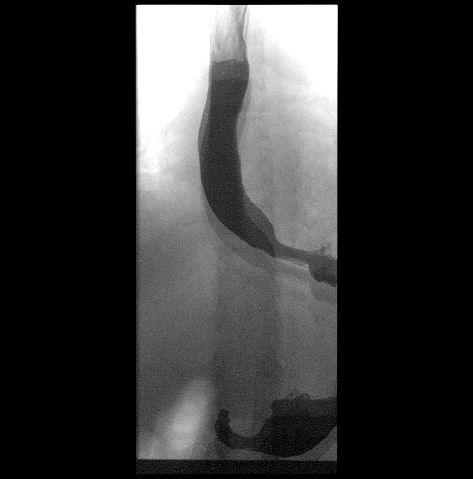

[Series 4: cp_standard · 0.25mm/px · 1 of 1 slices shown (4 of 6)]
[im 1/1]
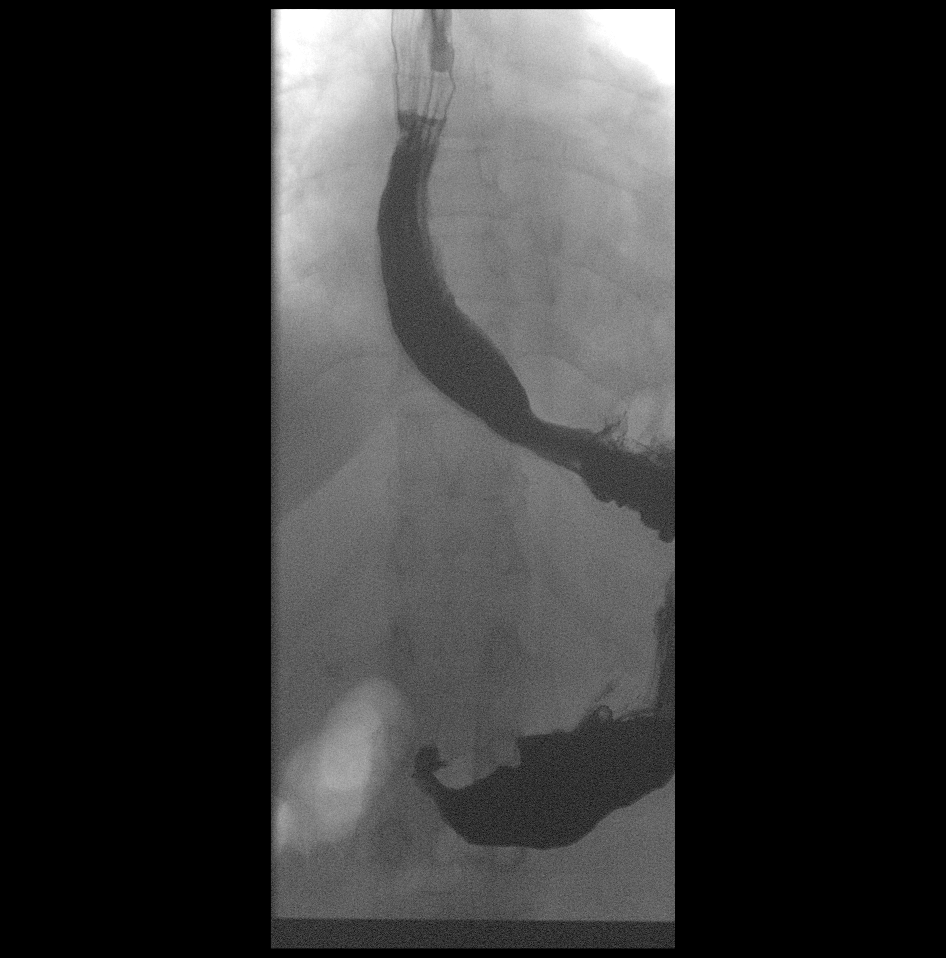

[Series 5: cp_standard · 0.51mm/px · 4 of 54 frames shown (5 of 6)]
[frame 6/54]
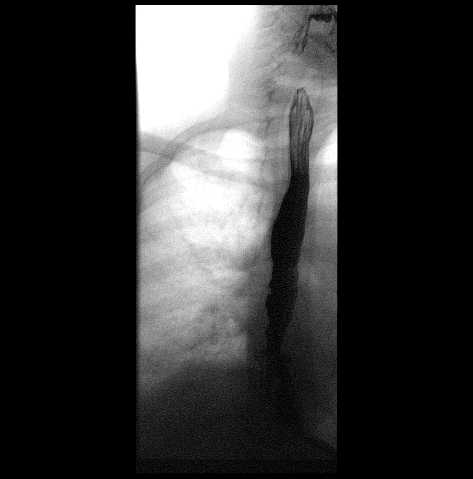
[frame 9/54]
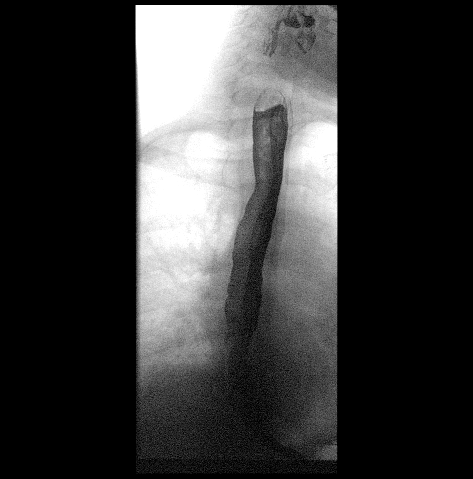
[frame 28/54]
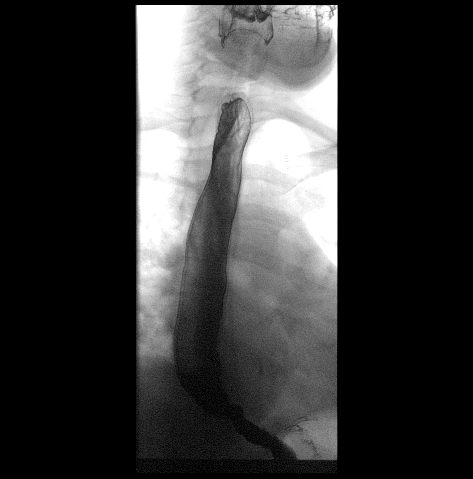
[frame 46/54]
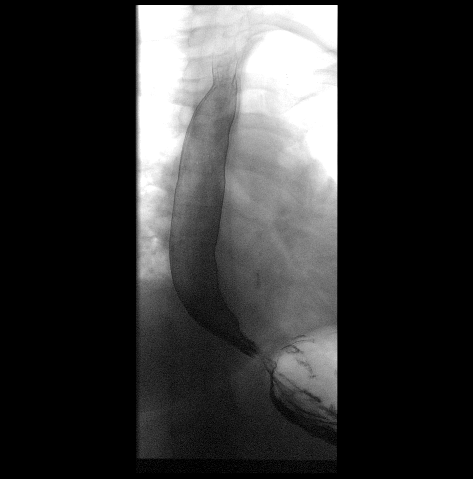

[Series 6: cp_standard · 0.26mm/px · 1 of 1 slices shown (6 of 6)]
[im 1/1]
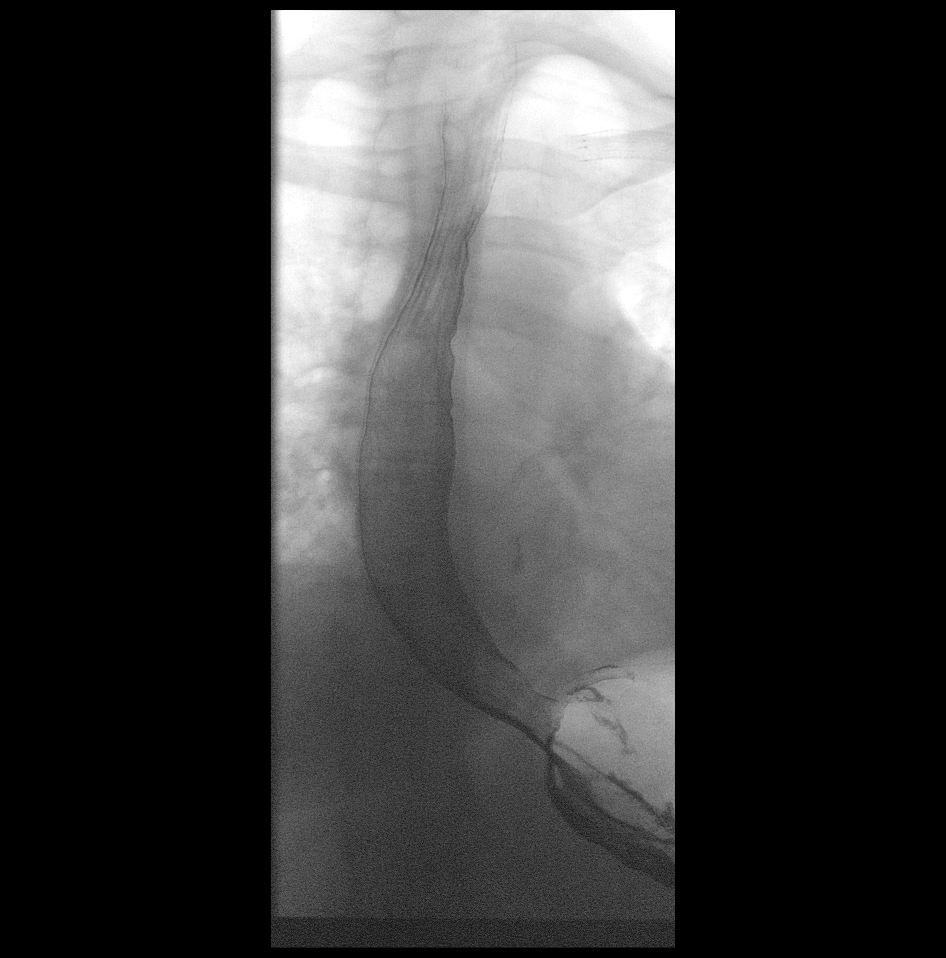

[14 of 15 positions shown; findings below may reference images not displayed]

FINDINGS: There was normal pharyngeal anatomy and motility. Contrast flowed
freely through the esophagus without evidence of stricture or mass.
There was normal esophageal mucosa without evidence of irregularity
or ulceration. Esophageal motility was normal. No evidence of
reflux. No definite hiatal hernia was demonstrated.

At the end of the examination a 13 mm barium tablet was administered
which transited through the esophagus and esophagogastric junction
without delay.
IMPRESSION: Normal barium swallow.

## 2017-12-20 IMAGING — CR DG SHOULDER 2+V*R*
3 series · 3 of 3 positions shown · non-contrast
Comparison: None.

CLINICAL DATA: Right shoulder pain for 3 days.  No known injury.

EXAM:
RIGHT SHOULDER - 2+ VIEW

[shoulder grashey]
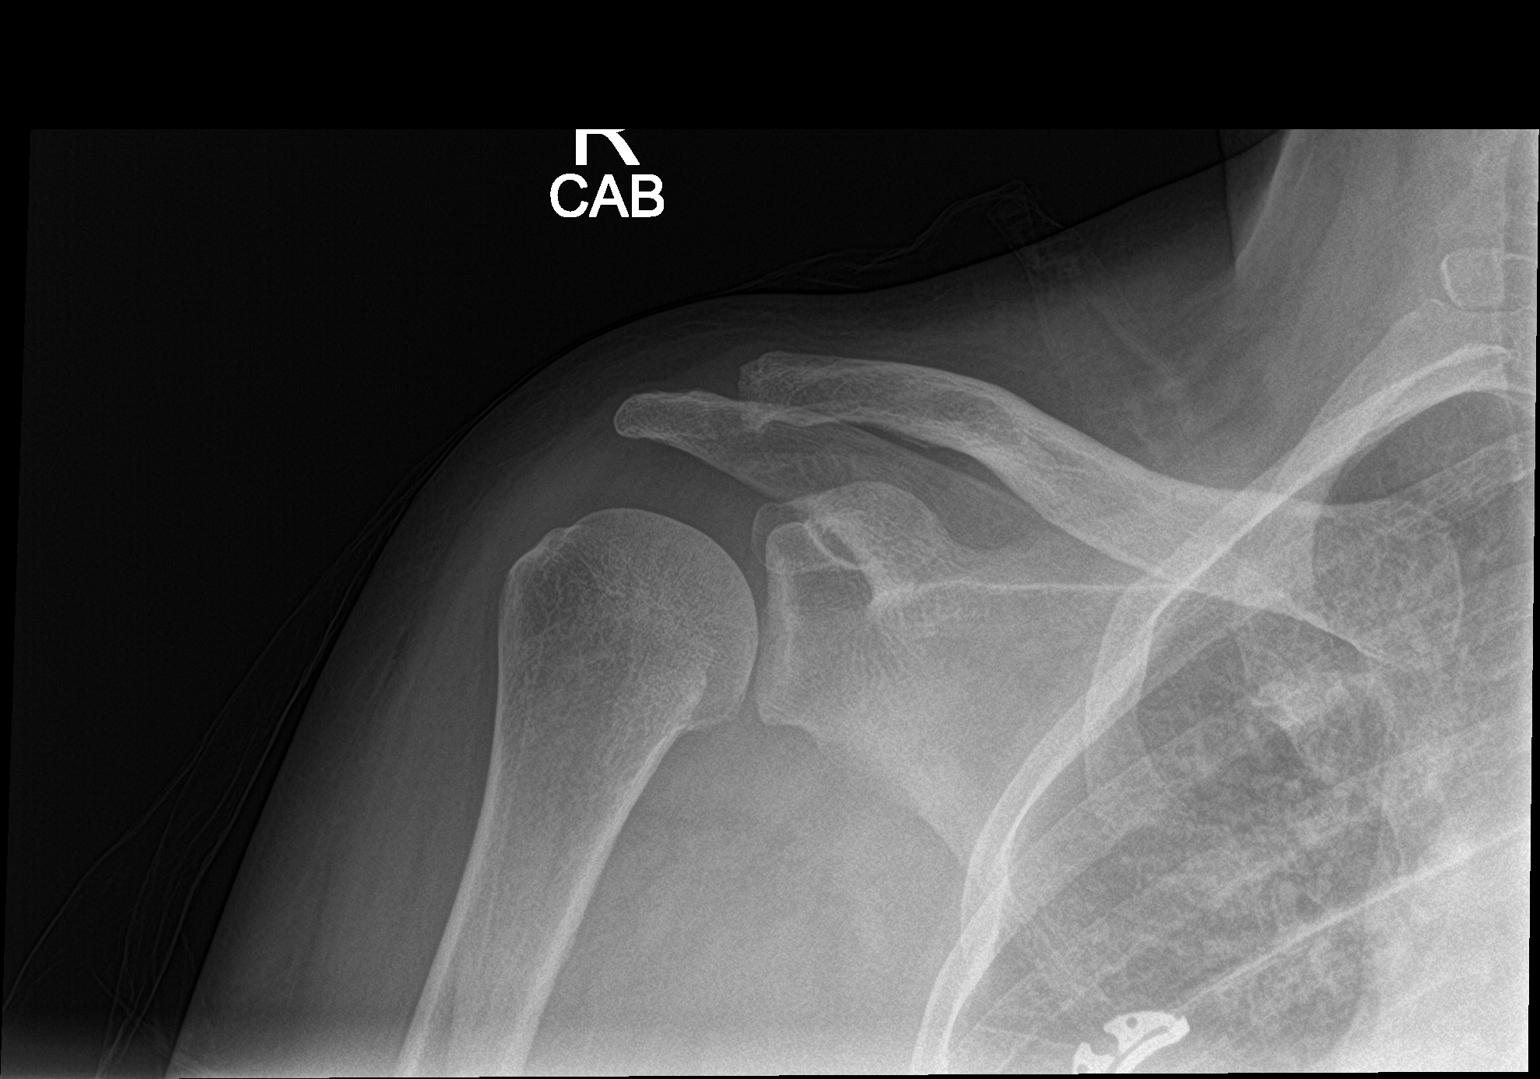

[shoulder y view]
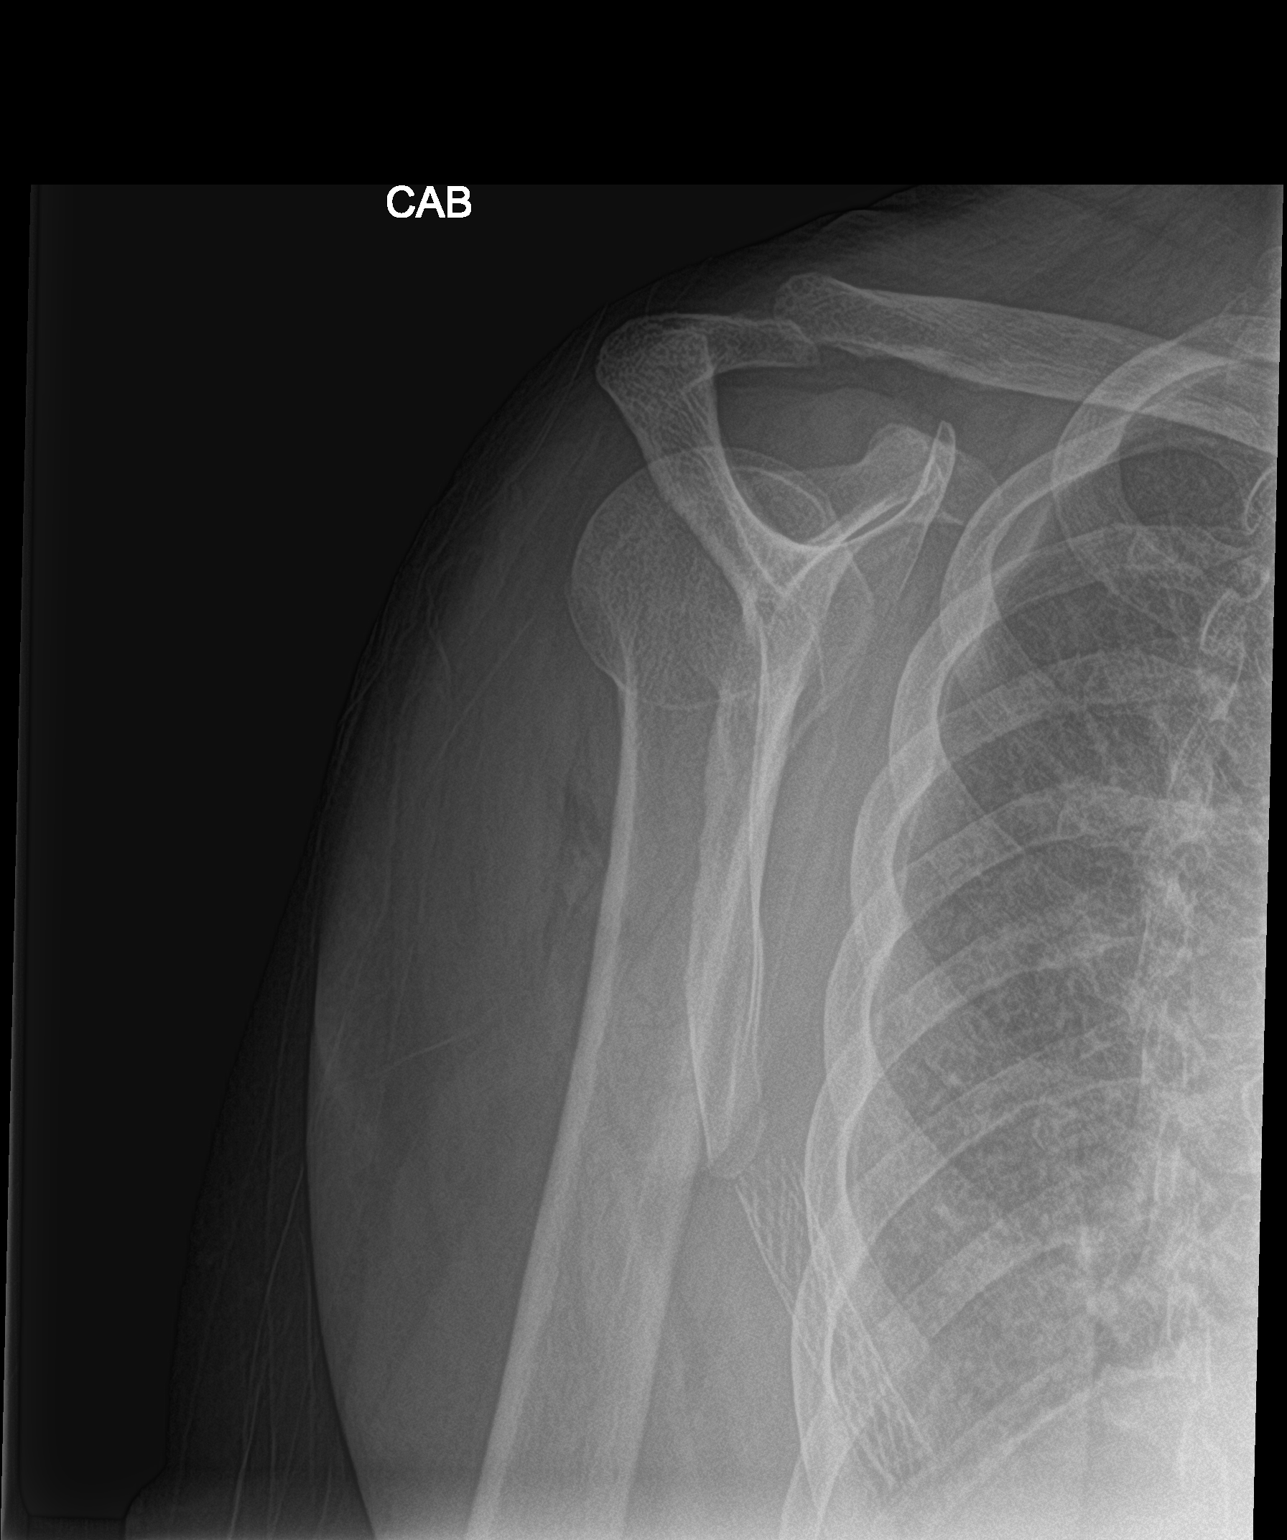

[shoulder axillary]
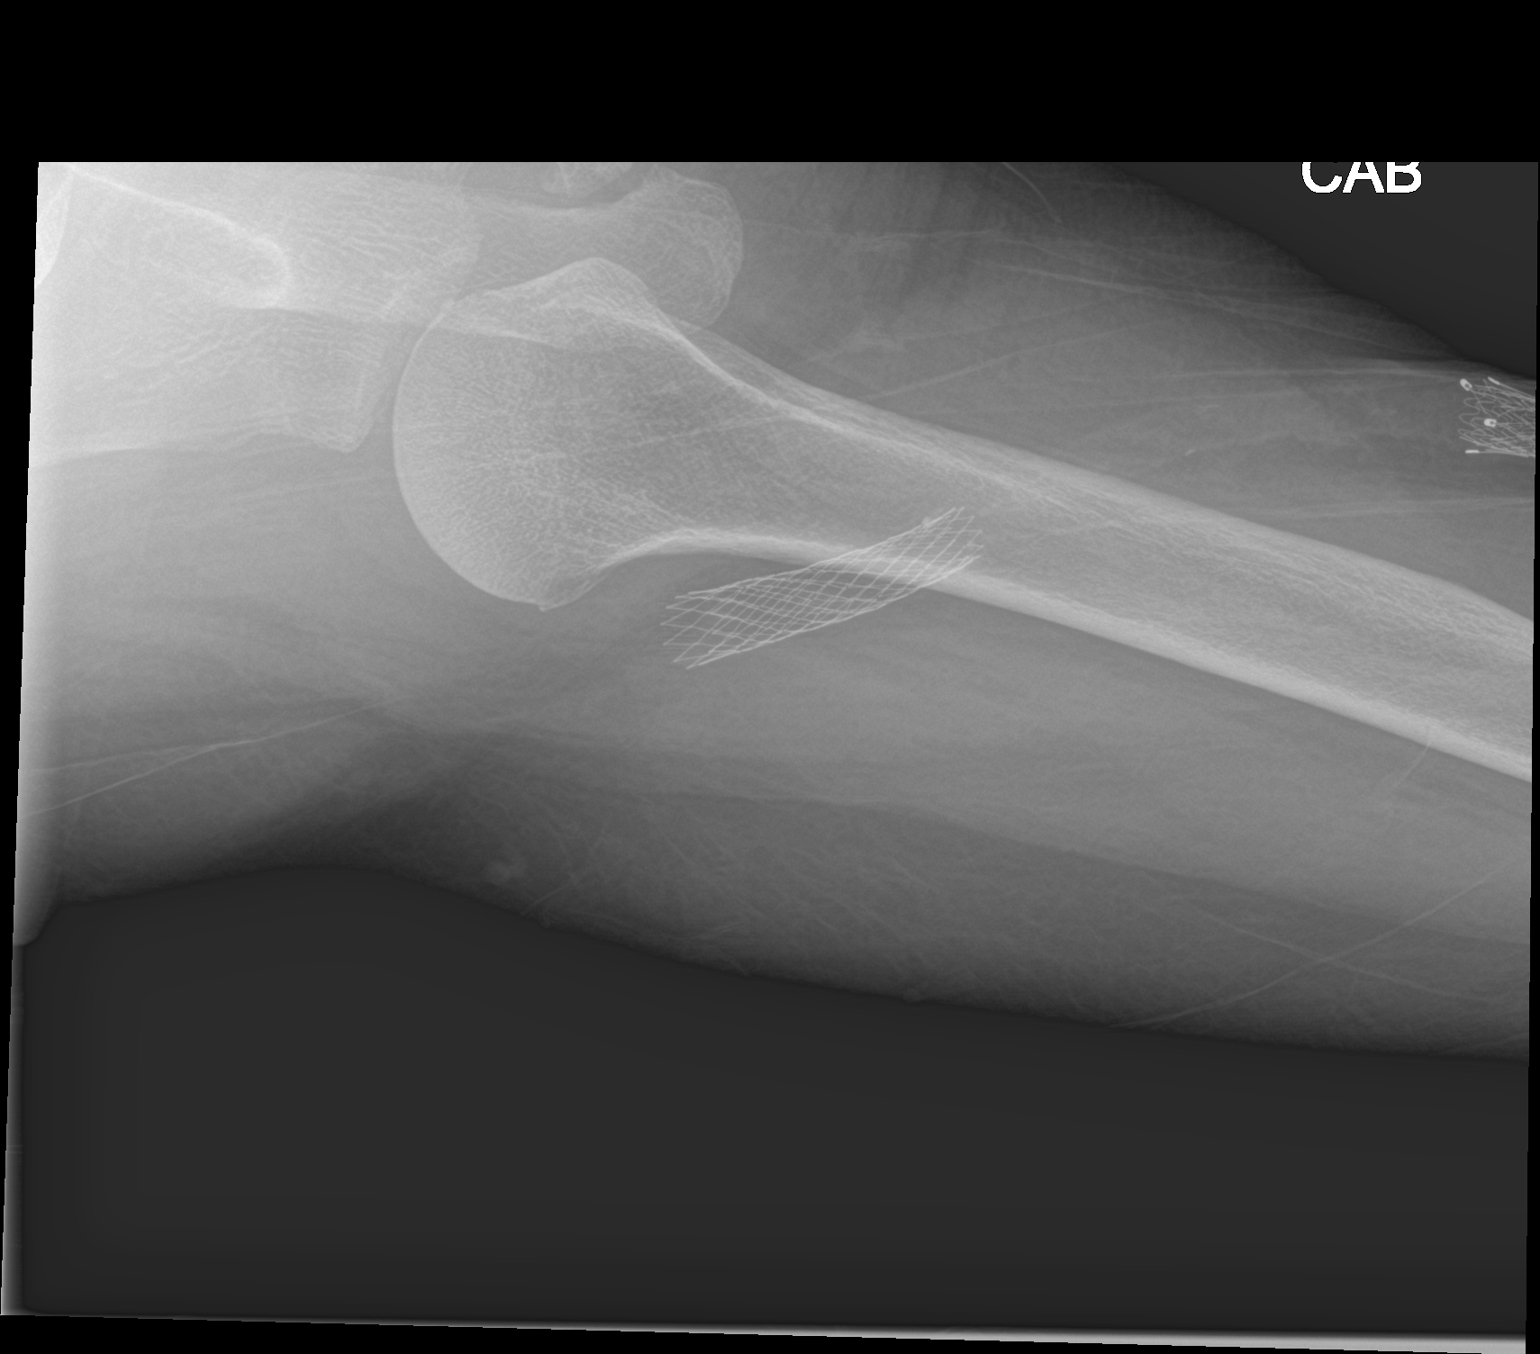

[3 of 3 positions shown; findings below may reference images not displayed]

FINDINGS: No acute bony or joint abnormality is identified. Mild
acromioclavicular degenerative change is seen. Imaged lung
parenchyma is clear. Vascular stents in the right axilla and upper
arm are noted.
IMPRESSION: No acute abnormality.

Mild acromioclavicular osteoarthritis.

## 2017-12-20 IMAGING — CT CT HEAD W/O CM
3 series · 15 of 47 positions shown, 18 images · non-contrast
Comparison: 06/09/2017

CLINICAL DATA: Headache and right arm pain since last dialysis
treatment on [REDACTED]. No known injury.

EXAM:
CT HEAD WITHOUT CONTRAST
TECHNIQUE: Contiguous axial images were obtained from the base of the skull
through the vertex without intravenous contrast.

[Series 3: head wo · axial · 0.44mm/px · z∈[-80,+45]mm · 9 of 30 slices shown, 12 images]
[im 3/30  brain]
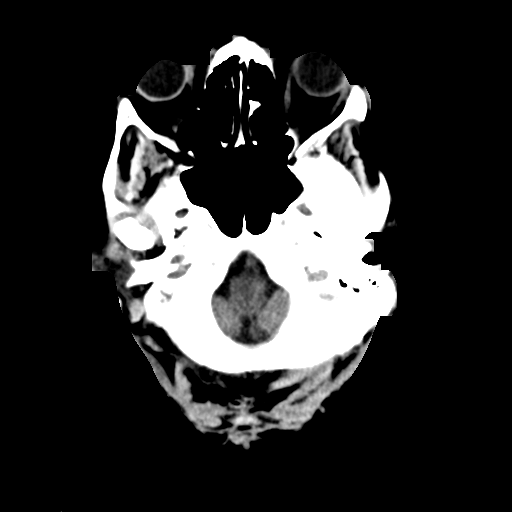
[im 3/30  bone]
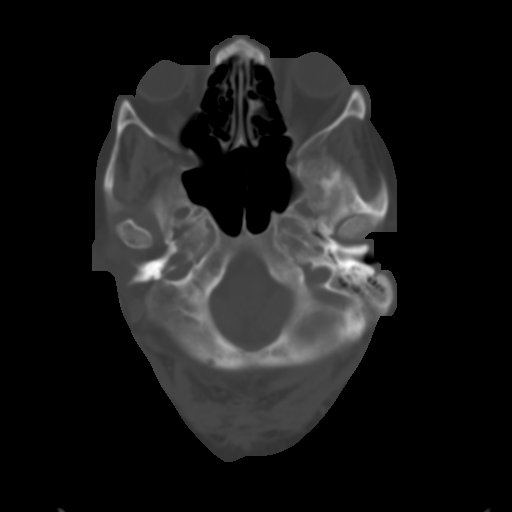
[im 6/30  brain]
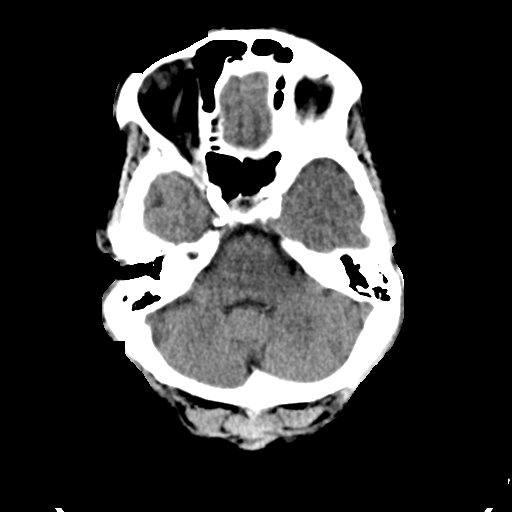
[im 9/30  brain]
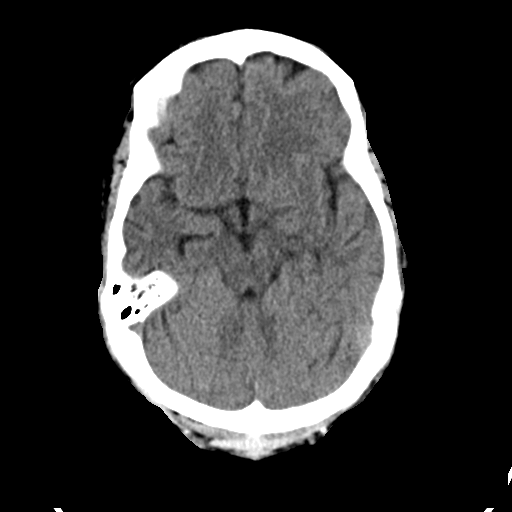
[im 12/30  brain]
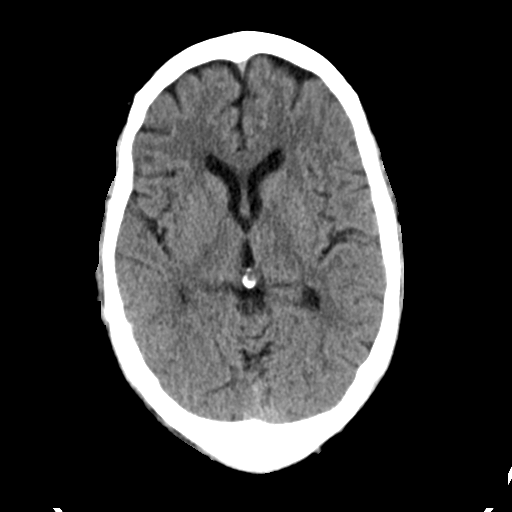
[im 16/30  brain]
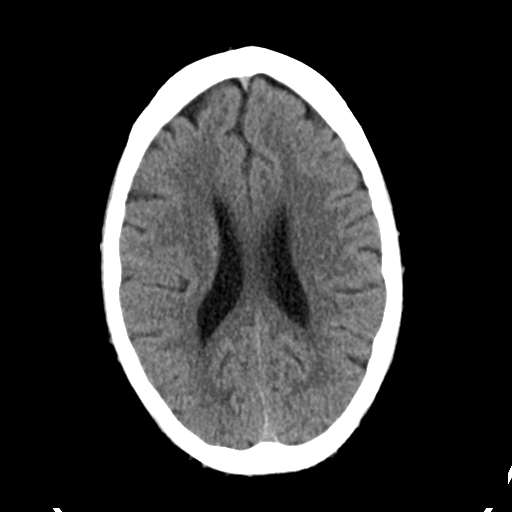
[im 16/30  bone]
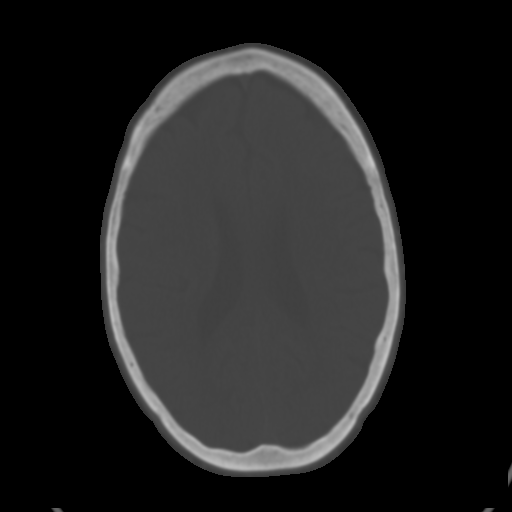
[im 19/30  brain]
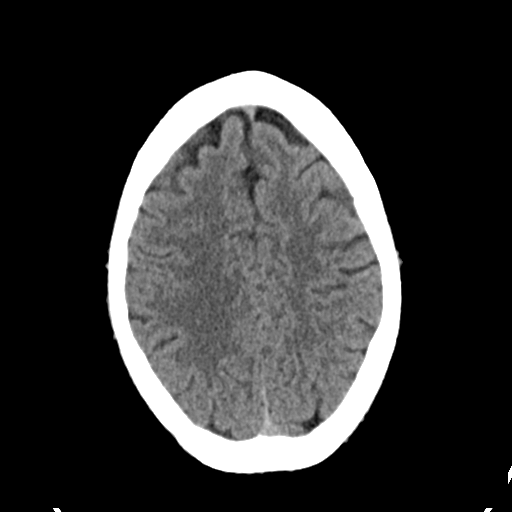
[im 22/30  brain]
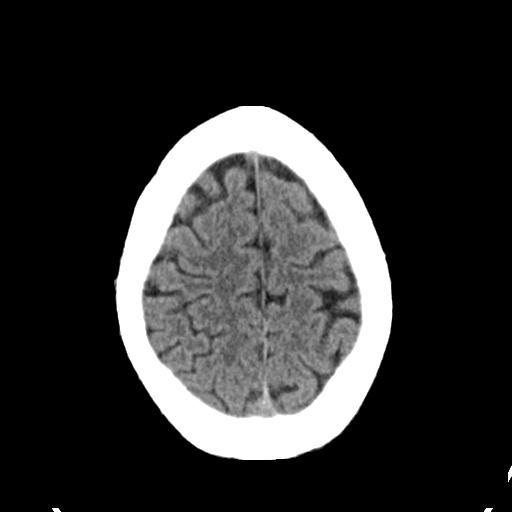
[im 25/30  brain]
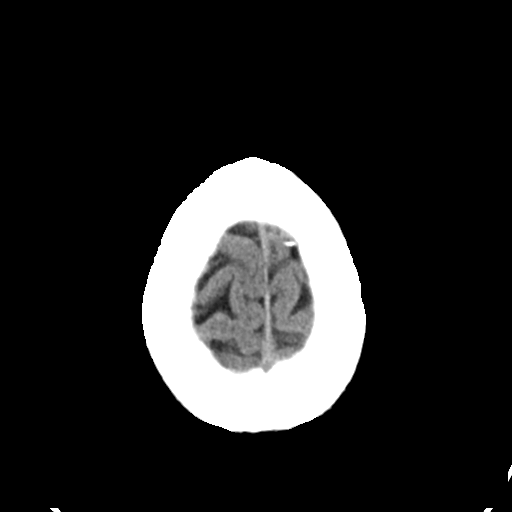
[im 28/30  brain]
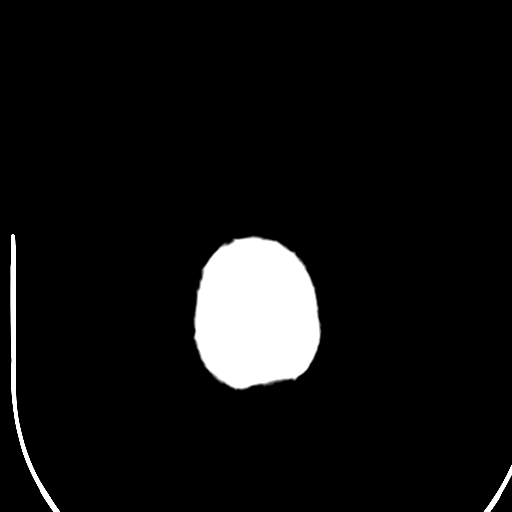
[im 28/30  bone]
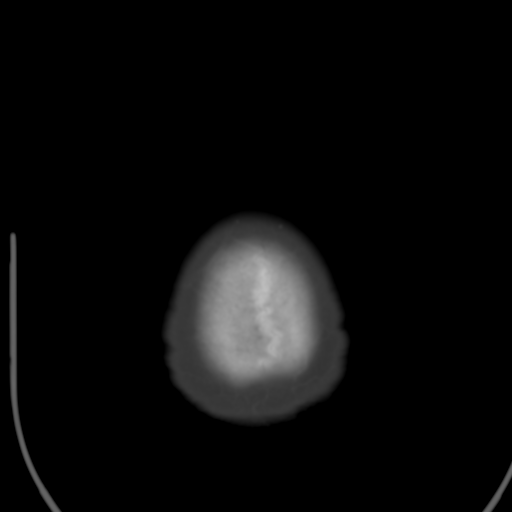

[Series 4: coronal soft tissue · coronal · 0.31mm/px · 3 of 69 slices shown]
[im 23/69  brain]
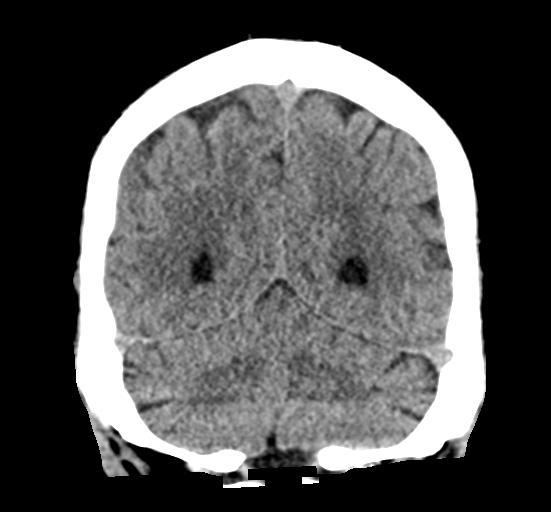
[im 31/69  brain]
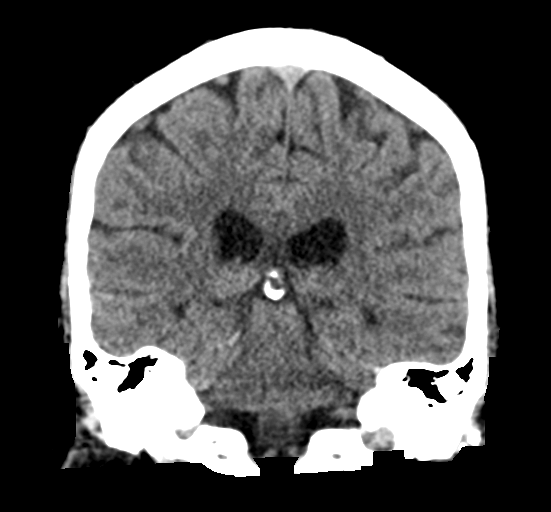
[im 38/69  brain]
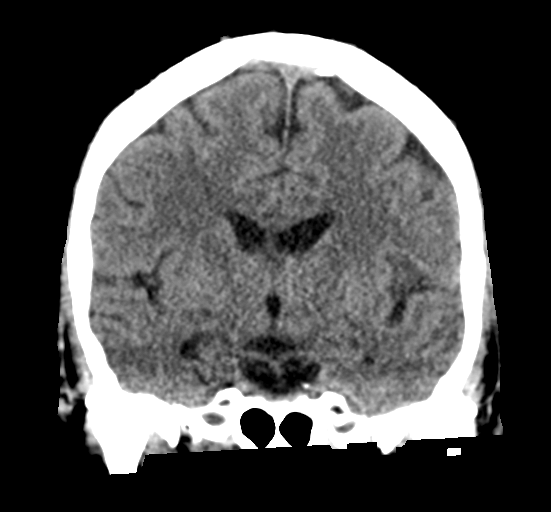

[Series 5: sagittal soft tissue · sagittal · 0.32mm/px · 3 of 49 slices shown]
[im 17/49  brain]
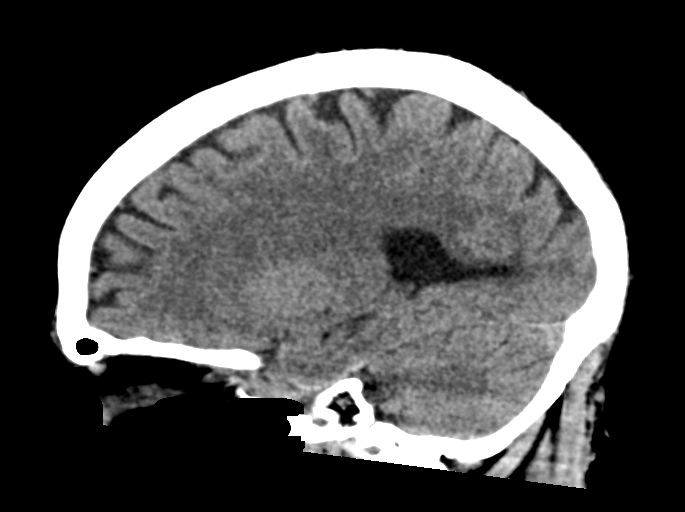
[im 25/49  brain]
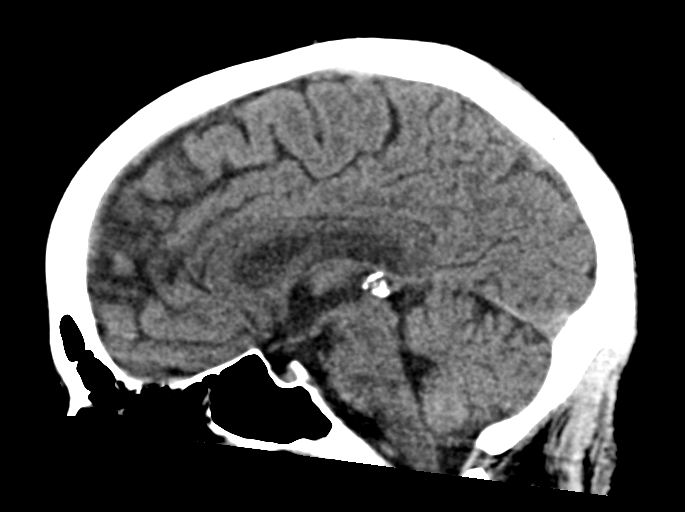
[im 33/49  brain]
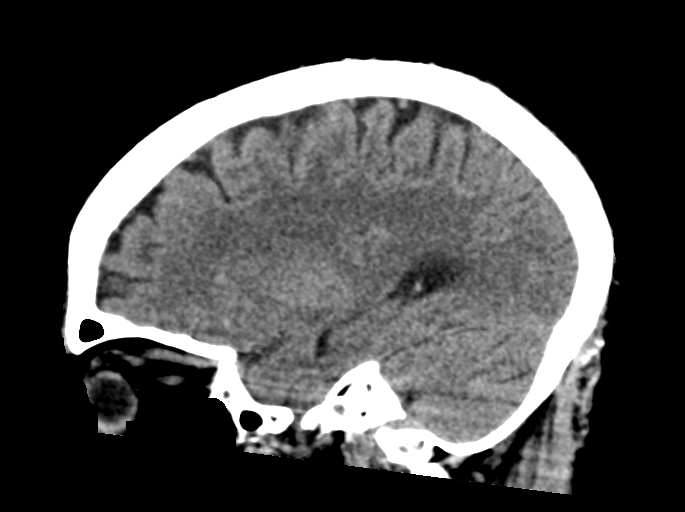

[15 of 47 positions shown; findings below may reference images not displayed]

FINDINGS: Brain: No evidence of acute infarction, hemorrhage, hydrocephalus,
extra-axial collection or mass lesion/mass effect. Midline fourth
ventricle and basal cisterns.

Vascular: No hyperdense vessel or unexpected calcification. Moderate
atherosclerosis of the cavernous sinus carotids and both vertebral
arteries.

Skull: Normal. Negative for fracture or focal lesion.

Sinuses/Orbits: No acute finding.

Other: None
IMPRESSION: No acute intracranial abnormality.

## 2017-12-24 ENCOUNTER — Encounter (INDEPENDENT_AMBULATORY_CARE_PROVIDER_SITE_OTHER): Payer: Medicare Other

## 2017-12-24 ENCOUNTER — Ambulatory Visit (INDEPENDENT_AMBULATORY_CARE_PROVIDER_SITE_OTHER): Payer: Medicare Other | Admitting: Vascular Surgery

## 2018-01-07 ENCOUNTER — Encounter (INDEPENDENT_AMBULATORY_CARE_PROVIDER_SITE_OTHER): Payer: Self-pay | Admitting: Vascular Surgery

## 2018-01-07 ENCOUNTER — Ambulatory Visit (INDEPENDENT_AMBULATORY_CARE_PROVIDER_SITE_OTHER): Payer: Medicare Other

## 2018-01-07 ENCOUNTER — Ambulatory Visit (INDEPENDENT_AMBULATORY_CARE_PROVIDER_SITE_OTHER): Payer: Medicare Other | Admitting: Vascular Surgery

## 2018-01-07 ENCOUNTER — Encounter (INDEPENDENT_AMBULATORY_CARE_PROVIDER_SITE_OTHER): Payer: Self-pay

## 2018-01-07 VITALS — BP 127/57 | HR 64 | Resp 16 | Ht 65.0 in | Wt 177.9 lb

## 2018-01-07 DIAGNOSIS — Z992 Dependence on renal dialysis: Secondary | ICD-10-CM

## 2018-01-07 DIAGNOSIS — I6523 Occlusion and stenosis of bilateral carotid arteries: Secondary | ICD-10-CM

## 2018-01-07 DIAGNOSIS — N186 End stage renal disease: Secondary | ICD-10-CM

## 2018-01-07 DIAGNOSIS — T829XXS Unspecified complication of cardiac and vascular prosthetic device, implant and graft, sequela: Secondary | ICD-10-CM

## 2018-01-07 NOTE — Progress Notes (Signed)
Subjective:    Patient ID: [name], female    DOB: 01/08/1961, 57 y.o.   MRN: [id number] Chief Complaint  Patient presents with  . Follow-up    Carotid and HDA ultra sound   Patient presents to review vascular studies.  The patient was last seen on December 08, 2017 to reestablish care for carotid artery stenosis.  The patient has been followed on a semi-regular basis for her end-stage renal disease/dialysis access.  The patient notes that she experiences intermittent memory loss and dizziness.  The patient denies any amaurosis fugax or focal neurological deficits.  The patient underwent a bilateral carotid artery duplex which was notable for 80-99% stenosis of the right proximal ICA, 40-59% of the left proximal ICA.  Bilateral vertebral arteries demonstrate antegrade flow.  Normal flow hemodynamics were seen in the right subclavian artery.  Monophasic turbulent blood flow in the left subclavian artery consistent with a left arm dialysis access.  The patient also underwent a left upper extremity dialysis access duplex which was notable for a total flow volume of 1572.  There is a significant stenosis noted in the left subclavian vein just distal to the stent.  There is a focal velocity increase in the patient's left distal DRIL bypass graft near the proximal end of the stent.  The patient notes experiencing issues with her access clotting during dialysis.  The patient denies any fever, nausea or vomiting.  Review of Systems  Constitutional: Negative.   HENT: Negative.   Eyes: Negative.   Respiratory: Negative.   Cardiovascular:       Carotid artery stenosis  Gastrointestinal: Negative.   Endocrine: Negative.   Genitourinary:       End-stage renal disease  Musculoskeletal: Negative.   Skin: Negative.   Allergic/Immunologic: Negative.   Neurological: Negative.   Hematological: Negative.   Psychiatric/Behavioral: Negative.       Objective:   Physical Exam  Constitutional: She is  oriented to person, place, and time. She appears well-developed and well-nourished.  HENT:  Head: Normocephalic and atraumatic.  Right Ear: External ear normal.  Left Ear: External ear normal.  Eyes: Pupils are equal, round, and reactive to light. EOM are normal.  Neck: Normal range of motion.  Bilateral carotid bruit noted  Cardiovascular: Normal rate, regular rhythm and normal heart sounds.  Pulses:      Radial pulses are 2+ on the right side, and 1+ on the left side.  Left upper extremity dialysis access pulsatile bruit and thrill.  Pulmonary/Chest: Effort normal.  Neurological: She is alert and oriented to person, place, and time.  Skin: Skin is warm and dry.  Psychiatric: She has a normal mood and affect. Her behavior is normal. Judgment and thought content normal.  Vitals reviewed.  BP (!) 127/57 (BP Location: Right Arm)   Pulse 64   Resp 16   Ht 5\' 5"  (1.651 m)   Wt 177 lb 13.9 oz (80.7 kg)   BMI 29.60 kg/m   Past Medical History:  Diagnosis Date  . Anemia   . Anginal pain ([place])   . Anxiety   . Arthritis   . Asthma   . Broken wrist   . Bronchitis   . chronic diastolic CHF 10/22/8655  . COPD (chronic obstructive pulmonary disease) ([place])   . Coronary artery disease    a. cath 2013: stenting to RCA (report not available); b. cath 2014: LM nl, pLAD 40%, mLAD nl, ost LCx 40%, mid LCx nl, pRCA 30% @  site of prior stent, mRCA 50%  . Depression   . Diabetes mellitus without complication ([employer])   . Diabetic neuropathy ([place])   . dialysis 2006  . Diverticulosis   . Dizziness   . Dyspnea   . Elevated lipids   . Environmental and seasonal allergies   . ESRD (end stage renal disease) on dialysis ([place])    M-W-F  . GERD (gastroesophageal reflux disease)   . Headache   . History of hiatal hernia   . HOH (hard of hearing)   . Hx of pancreatitis 2015  . Hypertension   . Lower extremity edema   . Mitral regurgitation    a. echo 10/2013: EF 62%, noWMA, mildly dilated LA, mild  to mod MR/TR, GR1DD  . Myocardial infarction ([place])   . Orthopnea   . Pneumonia   . Renal cancer ([place])   . Renal insufficiency    Pt is on dialysis on M,W + F.  . Wheezing    Social History   Socioeconomic History  . Marital status: Divorced    Spouse name: Not on file  . Number of children: Not on file  . Years of education: Not on file  . Highest education level: Not on file  Occupational History  . Not on file  Social Needs  . Financial resource strain: Not on file  . Food insecurity:    Worry: Not on file    Inability: Not on file  . Transportation needs:    Medical: Not on file    Non-medical: Not on file  Tobacco Use  . Smoking status: Former Smoker    Packs/day: 0.50    Years: 40.00    Pack years: 20.00    Types: Cigarettes    Last attempt to quit: 02/13/2015    Years since quitting: 2.9  . Smokeless tobacco: Never Used  Substance and Sexual Activity  . Alcohol use: Yes    Alcohol/week: 0.6 oz    Types: 1 Glasses of wine per week    Comment: glass wine week per pt  . Drug use: Yes    Types: Marijuana    Comment: once a day  . Sexual activity: Never  Lifestyle  . Physical activity:    Days per week: Not on file    Minutes per session: Not on file  . Stress: Not on file  Relationships  . Social connections:    Talks on phone: Not on file    Gets together: Not on file    Attends religious service: Not on file    Active member of club or organization: Not on file    Attends meetings of clubs or organizations: Not on file    Relationship status: Not on file  . Intimate partner violence:    Fear of current or ex partner: Not on file    Emotionally abused: Not on file    Physically abused: Not on file    Forced sexual activity: Not on file  Other Topics Concern  . Not on file  Social History Narrative  . Not on file   Past Surgical History:  Procedure Laterality Date  . ABDOMINAL HYSTERECTOMY  1992  . AMPUTATION TOE Left 10/02/2017   Procedure:  AMPUTATION TOE-LEFT GREAT TOE;  Surgeon: Albertine Patricia, DPM;  Location: [facility];  Service: Podiatry;  Laterality: Left;  . APPENDECTOMY    . CARDIAC CATHETERIZATION Left 07/26/2015   Procedure: Left Heart Cath and Coronary Angiography;  Surgeon: Dionisio David, MD;  Location: [place] [facility];  Service: Cardiovascular;  Laterality: Left;  . CATARACT EXTRACTION W/ INTRAOCULAR LENS IMPLANT Right   . CATARACT EXTRACTION W/PHACO Left 03/10/2017   Procedure: CATARACT EXTRACTION PHACO AND INTRAOCULAR LENS PLACEMENT (IOC);  Surgeon: Birder Robson, MD;  Location: [facility];  Service: Ophthalmology;  Laterality: Left;  [place] 00:51.9 AP% 14.2 CDE 7.39 fluid pack lot # [id number]  . CHOLECYSTECTOMY    . COLONOSCOPY WITH PROPOFOL N/A 08/12/2016   Procedure: COLONOSCOPY WITH PROPOFOL;  Surgeon: Lollie Sails, MD;  Location: [facility];  Service: Endoscopy;  Laterality: N/A;  . DIALYSIS FISTULA CREATION Left    upper arm  . dialysis grafts    . ESOPHAGOGASTRODUODENOSCOPY N/A 03/08/2015   Procedure: ESOPHAGOGASTRODUODENOSCOPY (EGD);  Surgeon: Manya Silvas, MD;  Location: [facility] ENDOSCOPY;  Service: Endoscopy;  Laterality: N/A;  . ESOPHAGOGASTRODUODENOSCOPY (EGD) WITH PROPOFOL N/A 03/18/2016   Procedure: ESOPHAGOGASTRODUODENOSCOPY (EGD) WITH PROPOFOL;  Surgeon: [name], MD;  Location: [facility] ENDOSCOPY;  Service: Endoscopy;  Laterality: N/A;  . EYE SURGERY Right 2018  . FECAL TRANSPLANT N/A 08/23/2015   Procedure: FECAL TRANSPLANT;  Surgeon: Manya Silvas, MD;  Location: [facility] ENDOSCOPY;  Service: Endoscopy;  Laterality: N/A;  . HAND SURGERY Bilateral   . PERIPHERAL VASCULAR CATHETERIZATION N/A 12/20/2015   Procedure: Thrombectomy of dialysis access versus permcath placement;  Surgeon: Algernon Huxley, MD;  Location: [place] [facility];  Service: Cardiovascular;  Laterality: N/A;  . PERIPHERAL VASCULAR CATHETERIZATION N/A 12/20/2015   Procedure: A/V Shunt Intervention;  Surgeon: Algernon Huxley, MD;   Location: [place] [facility];  Service: Cardiovascular;  Laterality: N/A;  . PERIPHERAL VASCULAR CATHETERIZATION N/A 12/20/2015   Procedure: A/V Shuntogram/Fistulagram;  Surgeon: Algernon Huxley, MD;  Location: [place] [facility];  Service: Cardiovascular;  Laterality: N/A;  . PERIPHERAL VASCULAR CATHETERIZATION N/A 01/02/2016   Procedure: A/V Shuntogram/Fistulagram;  Surgeon: Algernon Huxley, MD;  Location: [place] [facility];  Service: Cardiovascular;  Laterality: N/A;  . PERIPHERAL VASCULAR CATHETERIZATION N/A 01/02/2016   Procedure: A/V Shunt Intervention;  Surgeon: Algernon Huxley, MD;  Location: [place] [facility];  Service: Cardiovascular;  Laterality: N/A;   Family History  Problem Relation Age of Onset  . Kidney disease Mother   . Diabetes Mother   . Cancer Father   . Kidney disease Sister    Allergies  Allergen Reactions  . Ace Inhibitors Swelling and Anaphylaxis  . Ativan [Lorazepam] Other (See Comments)    Reaction:Hallucinations and headaches Reaction:Hallucinations and headaches Reaction:  Hallucinations and headaches  . Compazine [Prochlorperazine Edisylate] Anaphylaxis and Nausea And Vomiting    Other reaction(s): dystonia from this vs. reglan 23 Jul - patient relates that she takes promethazine frequently with no problems. 23 Jul - patient relates that she takes promethazine frequently with no problems.  . Sumatriptan Succinate     Other reaction(s): delirium and hallucinations per [facility] records  . Dilaudid [Hydromorphone Hcl] Other (See Comments)    Delirium   . Ondansetron Other (See Comments)    hallucinations    . Zofran [Ondansetron Hcl] Other (See Comments)    Reaction:  hallucinations  Reaction:  hallucinations   . Codeine Nausea And Vomiting  . Gabapentin Other (See Comments)    Reaction:  Unknown    . [place] Nausea And Vomiting  . Losartan Nausea Only  . Oxycodone Anxiety  . Prochlorperazine Other (See Comments)    Reaction:  Unknown . Patient  does not remember reaction but she does  have vertigo and anxiety along with n and v at times. Could be used to treat any of these   . Reglan [Metoclopramide]     Per patient her Dr. Evelina Bucy her off it  Per patient her Dr. Evelina Bucy her off it   . Scopolamine Other (See Comments)    Dizziness, also has vertigo already  . Tape Rash  . Tapentadol Rash      Assessment & Plan:  Patient presents to review vascular studies.  The patient was last seen on December 08, 2017 to reestablish care for carotid artery stenosis.  The patient has been followed on a semi-regular basis for her end-stage renal disease/dialysis access.  The patient notes that she experiences intermittent memory loss and dizziness.  The patient denies any amaurosis fugax or focal neurological deficits.  The patient underwent a bilateral carotid artery duplex which was notable for 80-99% stenosis of the right proximal ICA, 40-59% of the left proximal ICA.  Bilateral vertebral arteries demonstrate antegrade flow.  Normal flow hemodynamics were seen in the right subclavian artery.  Monophasic turbulent blood flow in the left subclavian artery consistent with a left arm dialysis access.  The patient also underwent a left upper extremity dialysis access duplex which was notable for a total flow volume of 1572.  There is a significant stenosis noted in the left subclavian vein just distal to the stent.  There is a focal velocity increase in the patient's left distal DRIL bypass graft near the proximal end of the stent.  The patient notes experiencing issues with her access clotting during dialysis.  The patient denies any fever, nausea or vomiting.  1. Bilateral carotid artery stenosis - Stable Patient with a known history of bilateral carotid artery stenosis Patient now with 80-99% stenosis of the proximal right ICA. Patient experiences intermittent dizziness and temporary memory loss Recommend a CTA of the neck to assess the patient's anatomy, exact  location/degree of narrowing in preparation for repair The patient is to call our office after she undergoes the CT and make an appointment to discuss the results with Dr. Delana Meyer Patient expresses her understanding  - CT Angio Neck W/Cm &/Or Wo/Cm; Future - BUN+Creat; Future  2. End-stage renal disease on hemodialysis ([place]) - Stable The patient is experiencing issues with her dialysis access clotting. The patient has 2 areas of narrowing in her graft. In an effort to keep her graft running adequately recommend a left upper extremity shuntogram with possible intervention to assess her anatomy and if appropriate correct any areas of stenosis. Procedure, risks and benefits explained to the patient All questions answered The patient wishes to proceed  Current Outpatient Medications on File Prior to Visit  Medication Sig Dispense Refill  . acidophilus (RISAQUAD) CAPS capsule Take 1 capsule by mouth daily.    Marland Kitchen albuterol (PROVENTIL HFA;VENTOLIN HFA) 108 (90 Base) MCG/ACT inhaler Inhale into the lungs every 6 (six) hours as needed for wheezing or shortness of breath.    Marland Kitchen albuterol (PROVENTIL) (2.5 MG/3ML) 0.083% nebulizer solution Take 2.5 mg by nebulization every 4 (four) hours as needed for wheezing or shortness of breath.    Marland Kitchen amLODipine (NORVASC) 10 MG tablet Take 10 mg by mouth daily.    Marland Kitchen aspirin EC 81 MG tablet Take 81 mg by mouth daily.    Marland Kitchen atorvastatin (LIPITOR) 20 MG tablet Take 20 mg by mouth at bedtime.     . cetirizine (ZYRTEC) 10 MG tablet Take 10 mg by mouth daily.     Marland Kitchen  clopidogrel (PLAVIX) 75 MG tablet Take 1 tablet (75 mg total) by mouth daily. 30 tablet 2  . cyanocobalamin 1000 MCG tablet Take 1,000 mcg by mouth daily with lunch.     . dexlansoprazole (DEXILANT) 60 MG capsule Take 60 mg by mouth daily.    Marland Kitchen FLUoxetine (PROZAC) 20 MG capsule Take 1 capsule (20 mg total) by mouth daily. 30 capsule 0  . glucose 4 GM chewable tablet Chew 3-4 tablets by mouth as needed for low  blood sugar (blood sugar less than 60).     Marland Kitchen HYDROcodone-acetaminophen (NORCO/VICODIN) 5-325 MG tablet Take 1 tablet by mouth every 6 (six) hours as needed for moderate pain or severe pain. (Patient taking differently: Take 1 tablet by mouth 3 (three) times daily as needed for moderate pain or severe pain. ) 15 tablet 0  . insulin detemir (LEVEMIR) 100 UNIT/ML injection Inject 0.02 mLs (2 Units total) into the skin 2 (two) times daily. (Patient taking differently: Inject 4 Units into the skin at bedtime. ) 10 mL 11  . lipase/protease/amylase (CREON) 12000 UNITS CPEP capsule Take 24,000-36,000 Units by mouth 3 (three) times daily with meals. Take 36000 mg in the morning, 24000 midday & 36000 in the evening    . multivitamin (RENA-VIT) TABS tablet Take 1 tablet by mouth daily.     . naloxone (NARCAN) 0.4 MG/ML injection Inject 0.4 mg into the vein as needed.    . nitroGLYCERIN (NITROSTAT) 0.4 MG SL tablet Place 0.4 mg under the tongue every 5 (five) minutes x 3 doses as needed.    Marland Kitchen OLANZapine (ZYPREXA) 5 MG tablet Take 5 mg by mouth at bedtime.    Marland Kitchen omega-3 acid ethyl esters (LOVAZA) 1 g capsule Take 2 g by mouth daily.     . promethazine (PHENERGAN) 25 MG suppository Place 1 suppository (25 mg total) rectally every 6 (six) hours as needed for nausea. 12 suppository 1  . promethazine (PHENERGAN) 25 MG tablet Take 0.5 tablets (12.5 mg total) by mouth every 6 (six) hours as needed for nausea or vomiting. 20 tablet 0  . ranitidine (ZANTAC) 300 MG tablet Take 300 mg by mouth at bedtime. In the afternoon    . rifaximin (XIFAXAN) 550 MG TABS tablet Take 550 mg by mouth 2 (two) times daily.    . sevelamer carbonate (RENVELA) 800 MG tablet Take 800 mg by mouth 3 (three) times daily with meals.    Marland Kitchen alum & mag hydroxide-simeth (MAALOX MAX) 400-400-40 MG/5ML suspension Take 5 mLs by mouth every 6 (six) hours as needed for indigestion. (Patient not taking: Reported on 10/09/2017) 355 mL 0  . cyclobenzaprine  (FLEXERIL) 5 MG tablet Take 1 tablet (5 mg total) by mouth 3 (three) times daily as needed for muscle spasms. (Patient not taking: Reported on 10/27/2017) 12 tablet 0  . doxycycline (VIBRA-TABS) 100 MG tablet Take 1 tablet (100 mg total) by mouth every 12 (twelve) hours. (Patient not taking: Reported on 10/09/2017) 60 tablet 0  . famotidine (PEPCID) 20 MG tablet Take 1 tablet (20 mg total) by mouth 2 (two) times daily. (Patient not taking: Reported on 09/30/2017) 60 tablet 1  . methocarbamol (ROBAXIN) 500 MG tablet Take 1 tablet (500 mg total) by mouth 4 (four) times daily. (Patient not taking: Reported on 10/27/2017) 20 tablet 0  . metoCLOPramide (REGLAN) 10 MG tablet Take 1 tablet (10 mg total) by mouth every 8 (eight) hours as needed for nausea or vomiting. (Patient not taking: Reported on 10/07/2017) 30  tablet 1   No current facility-administered medications on file prior to visit.    There are no Patient Instructions on file for this visit. No follow-ups on file.  KIMBERLY A STEGMAYER, PA-C

## 2018-01-09 IMAGING — CR DG ABDOMEN 1V
1 series · 2 of 2 positions shown · non-contrast
Comparison: None.

CLINICAL DATA: Vomiting and abdominal pain

EXAM:
ABDOMEN - 1 VIEW

[Series 1: dg abd 1 view · 0.14mm/px · 2 of 2 slices shown]
[im 1/2]
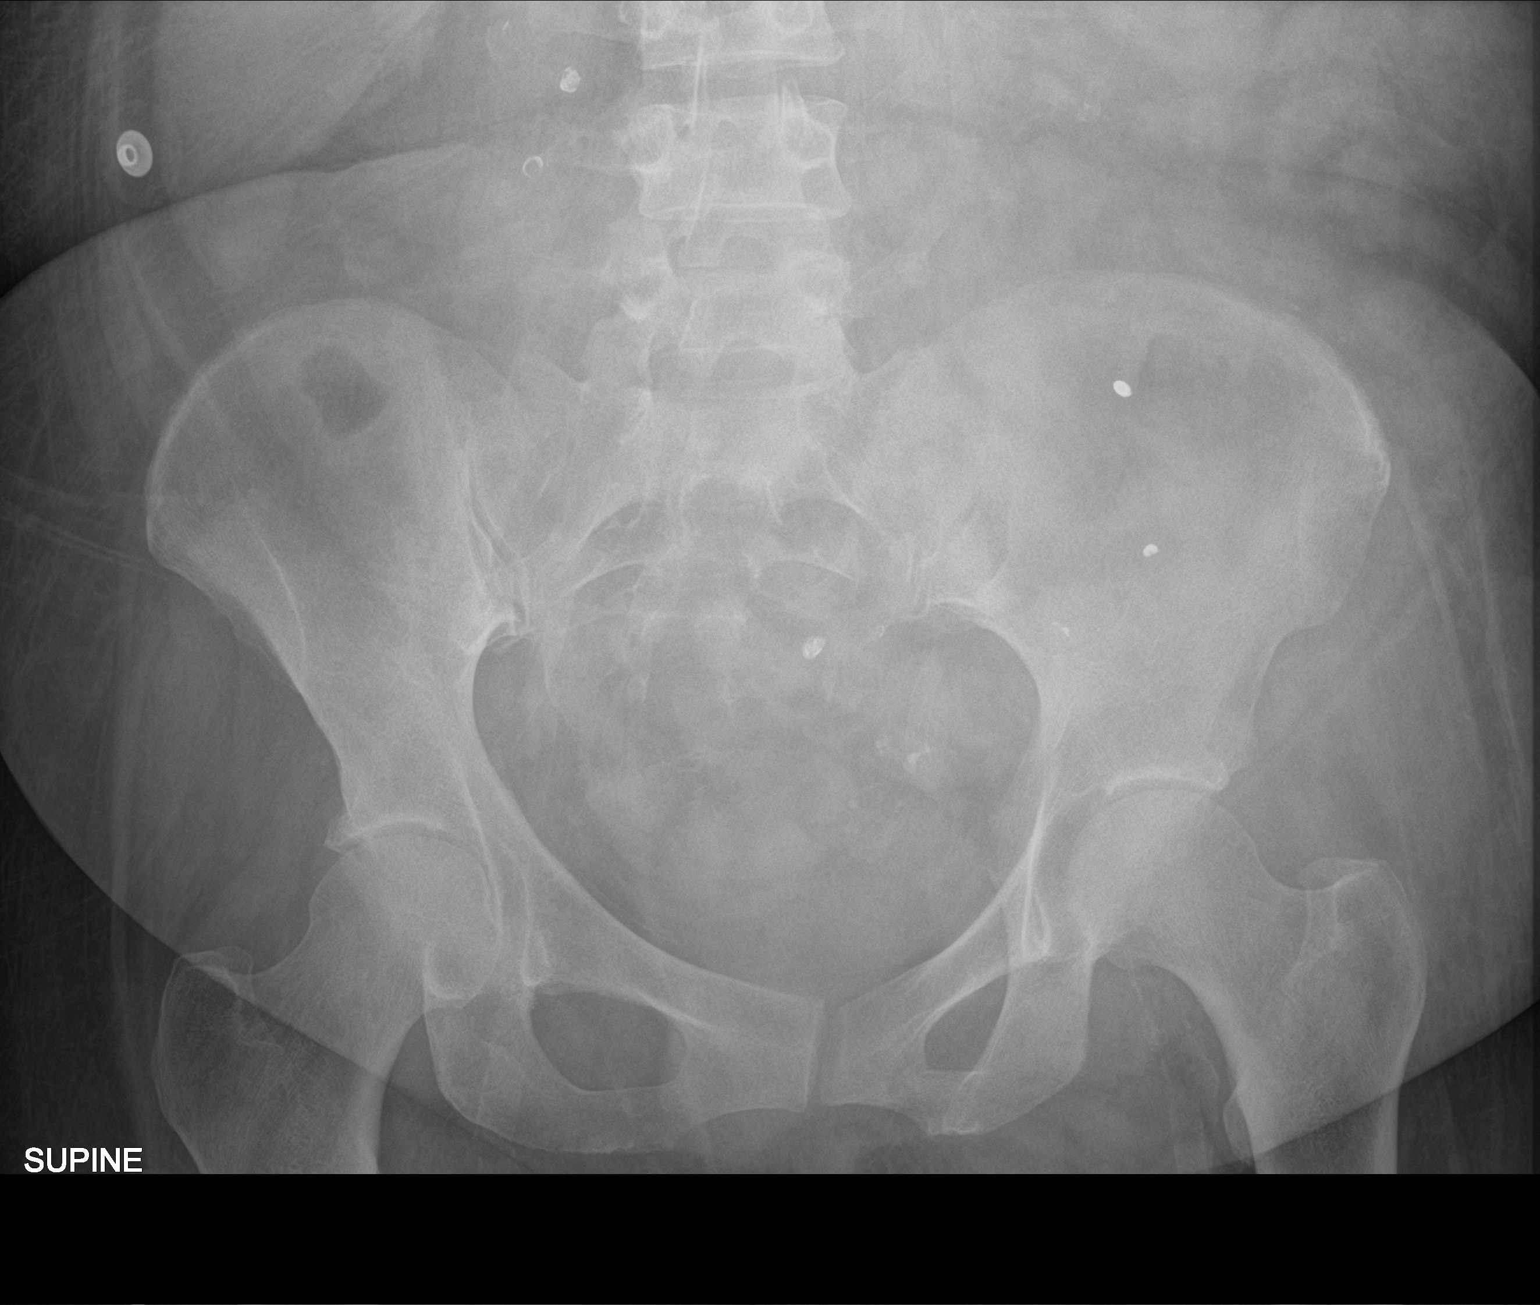
[im 2/2]
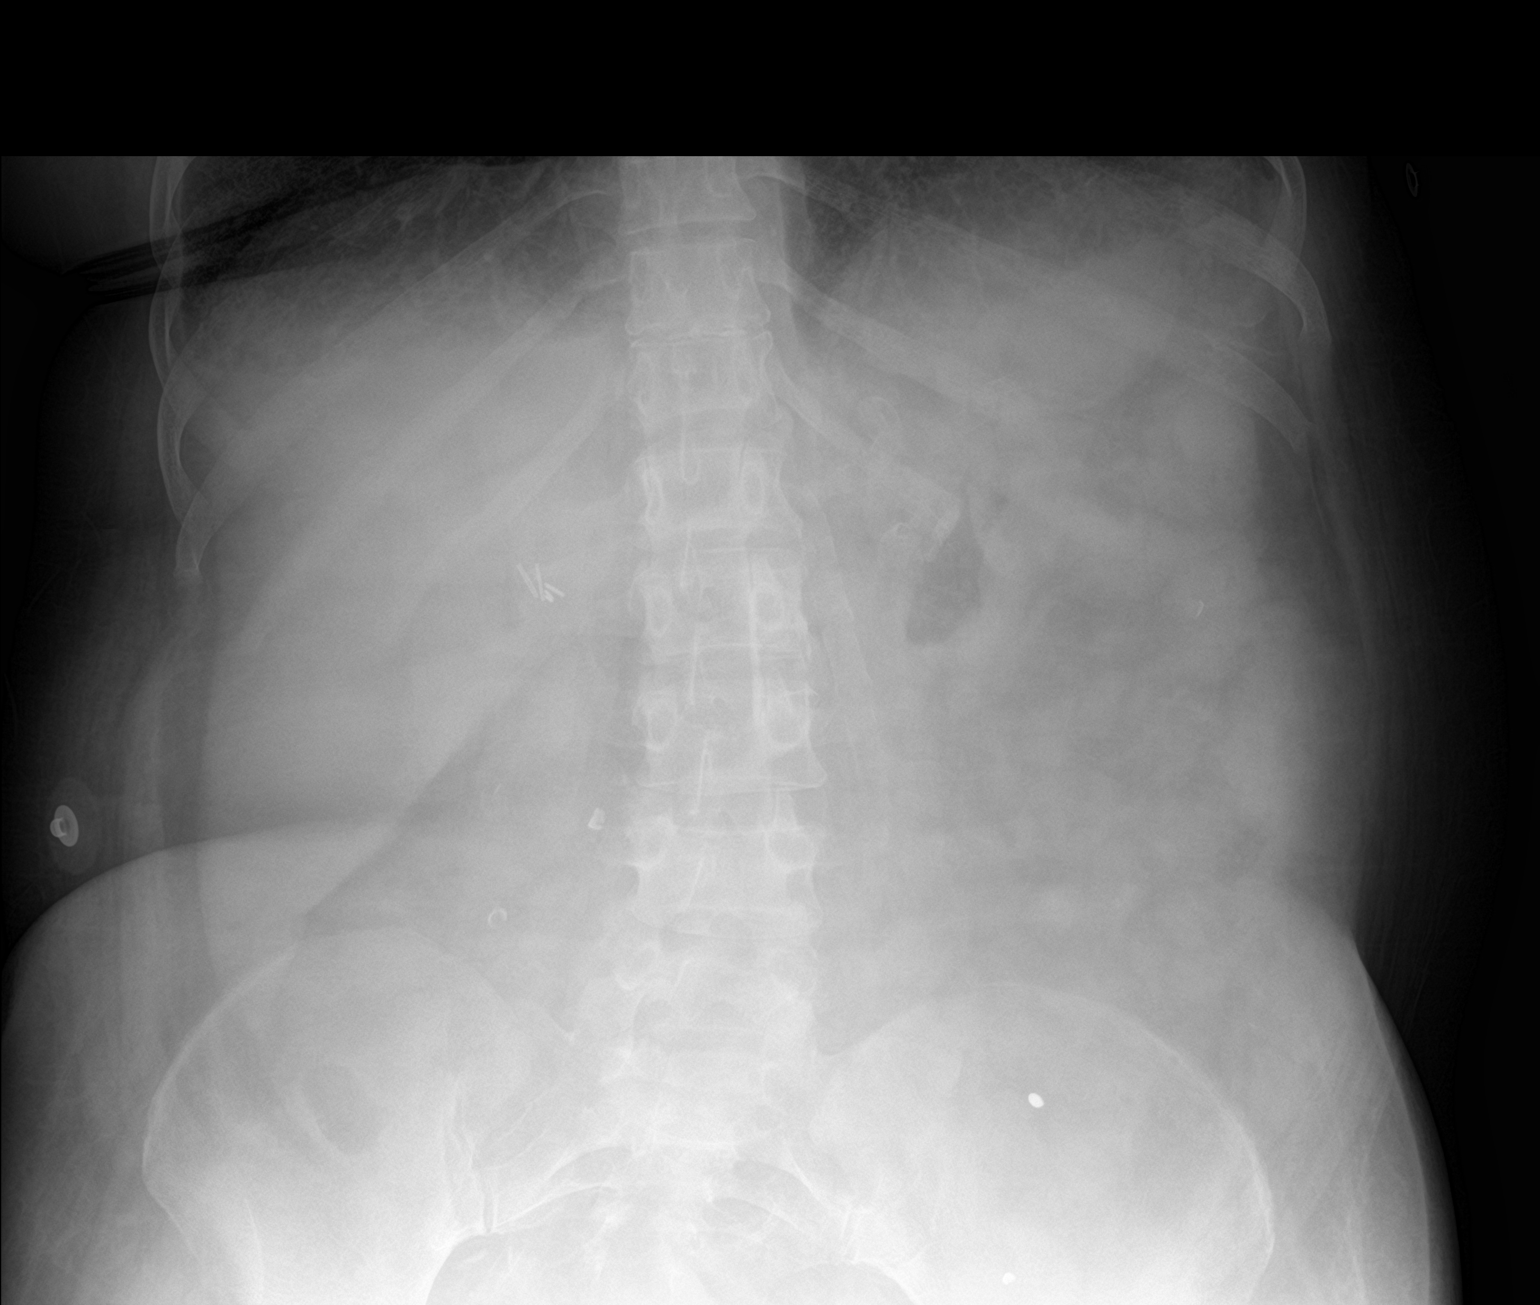

[2 of 2 positions shown; findings below may reference images not displayed]

FINDINGS: The bowel gas pattern is normal. Multiple calcifications overlie the
lower abdomen and pelvis. Visualized bones are normal.
IMPRESSION: Nonobstructive bowel gas pattern.

## 2018-01-09 IMAGING — CT CT ABD-PELV W/O CM
2 of 4 series · 16 of 46 positions shown, 18 images · non-contrast
Comparison: 06/09/2017

CLINICAL DATA: Abdominal pain with nausea and vomiting.

EXAM:
CT ABDOMEN AND PELVIS WITHOUT CONTRAST
TECHNIQUE: Multidetector CT imaging of the abdomen and pelvis was performed
following the standard protocol without IV contrast.

[Series 2: routine abd/pel wo · axial · 0.74mm/px · z∈[-1241,-826]mm · 13 of 91 slices shown, 15 images]
[im 4/91  soft-tissue]
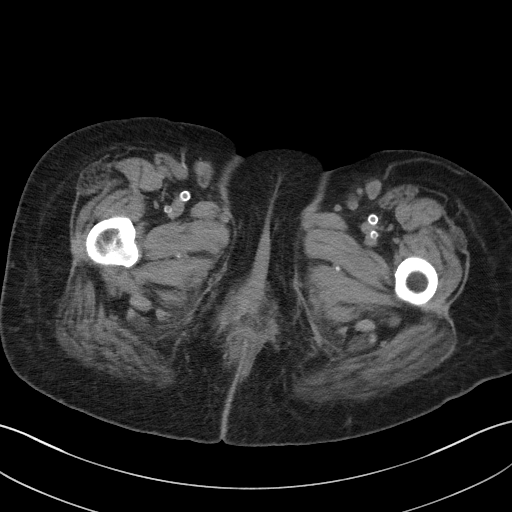
[im 4/91  bone]
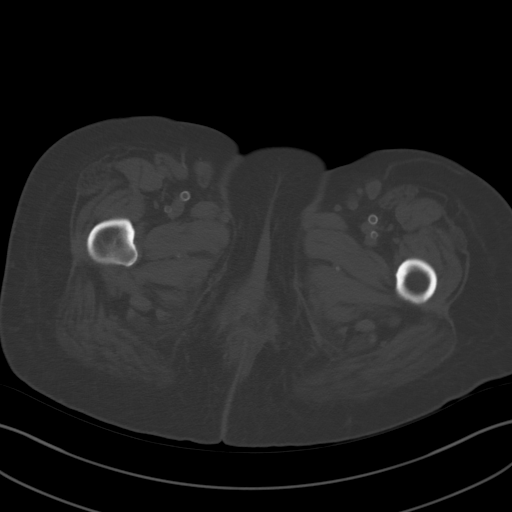
[im 11/91  soft-tissue]
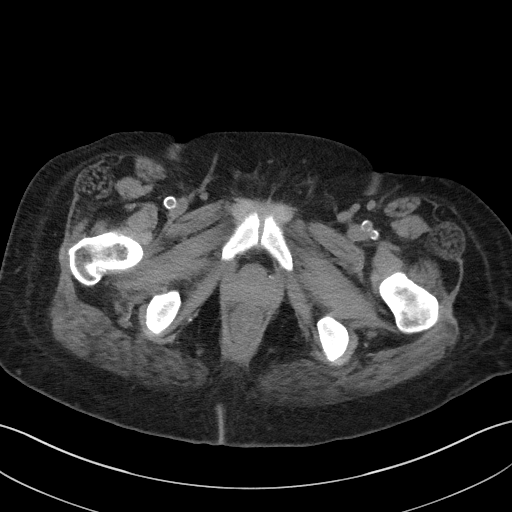
[im 19/91  soft-tissue]
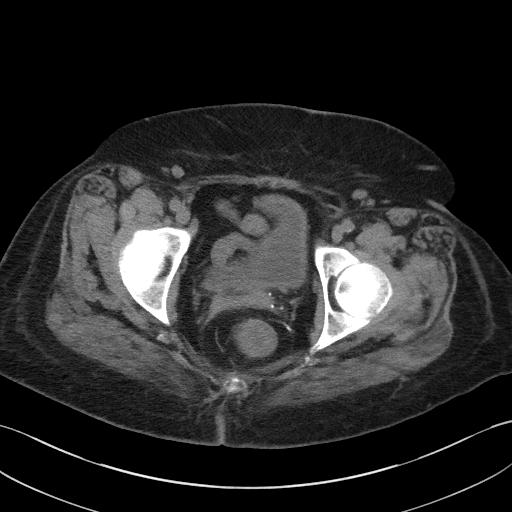
[im 26/91  soft-tissue]
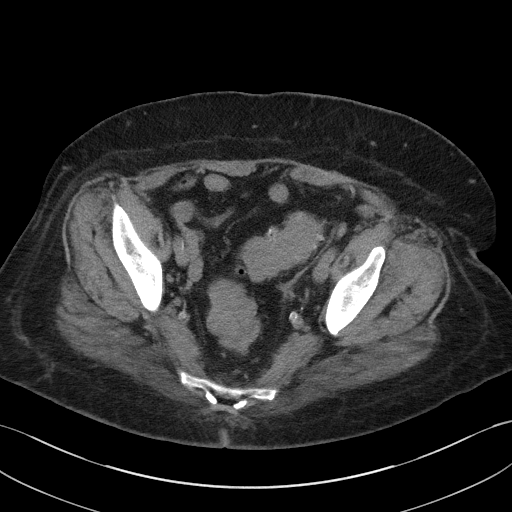
[im 33/91  soft-tissue]
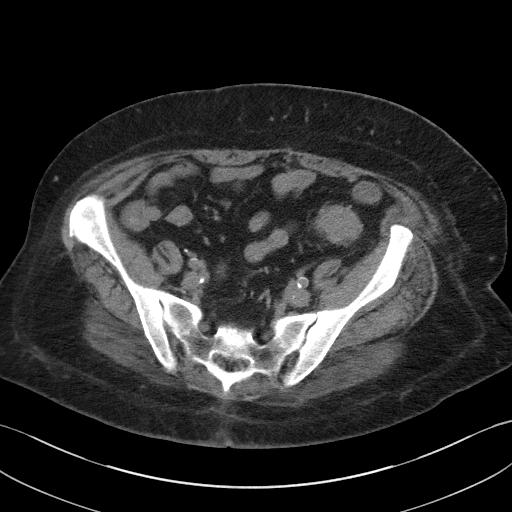
[im 40/91  soft-tissue]
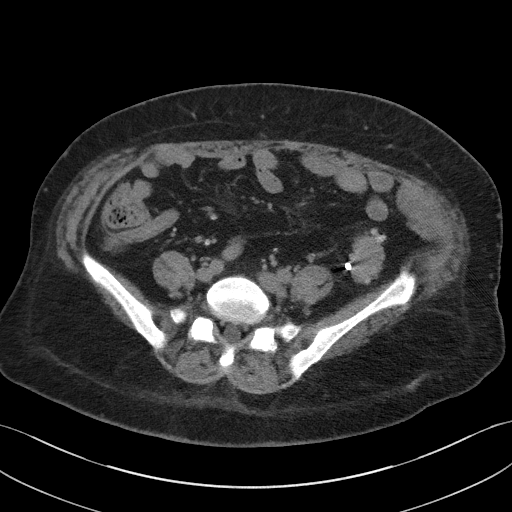
[im 47/91  soft-tissue]
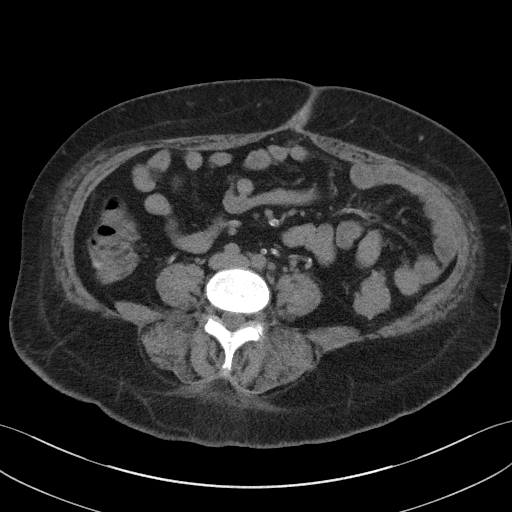
[im 51/91  soft-tissue]
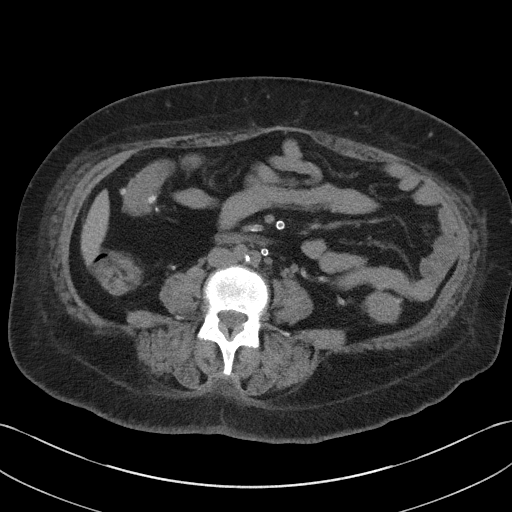
[im 58/91  soft-tissue]
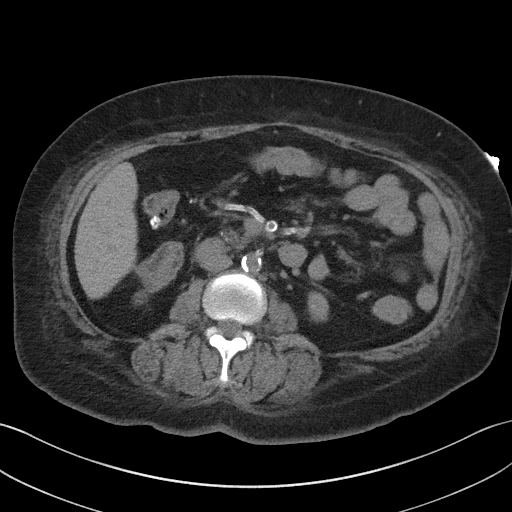
[im 58/91  bone]
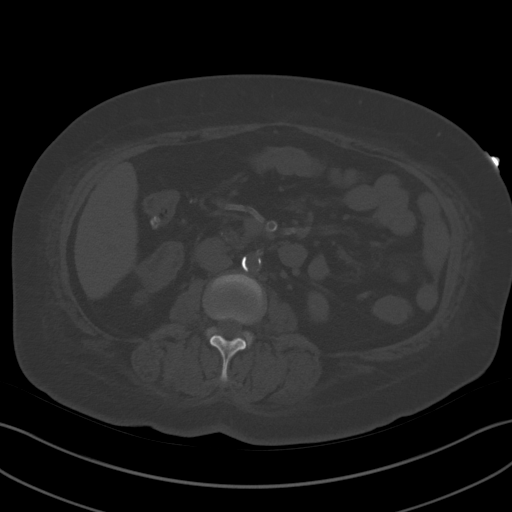
[im 65/91  soft-tissue]
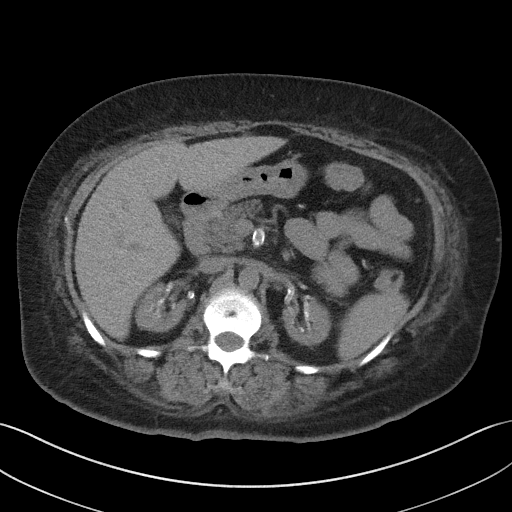
[im 73/91  soft-tissue]
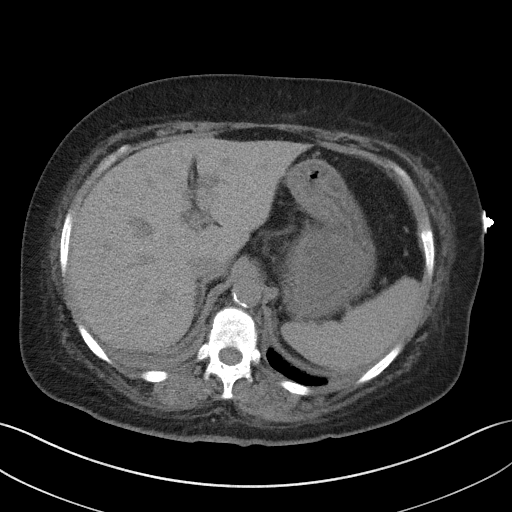
[im 80/91  soft-tissue]
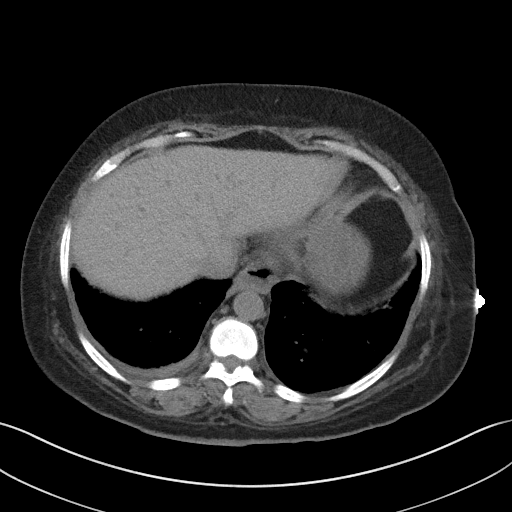
[im 87/91  soft-tissue]
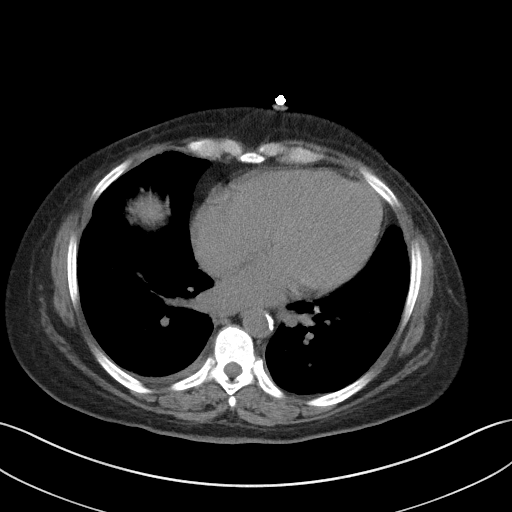

[Series 5: coronal st · coronal · 0.81mm/px · 3 of 84 slices shown]
[im 28/84  soft-tissue]
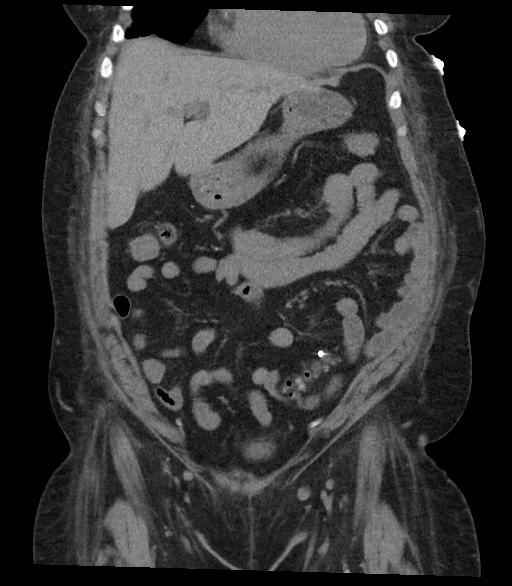
[im 37/84  soft-tissue]
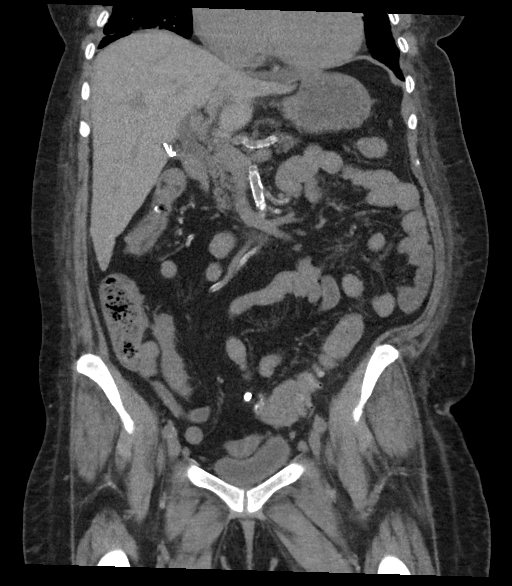
[im 47/84  soft-tissue]
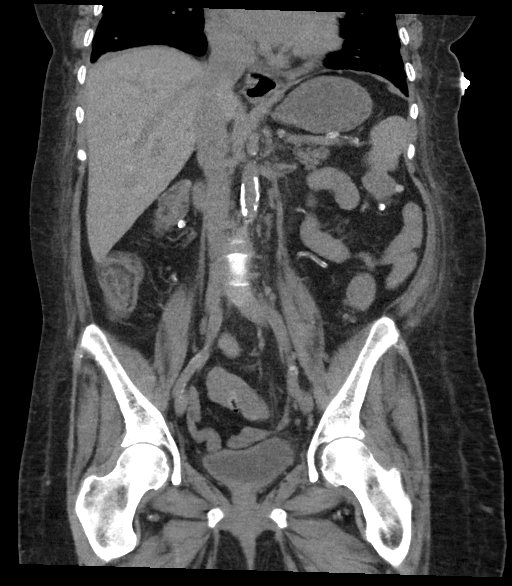

[16 of 46 positions shown; findings below may reference images not displayed]

FINDINGS: Lower chest: Heart size upper normal to mildly increased. Tiny right
pleural effusion noted.

Hepatobiliary: No focal abnormality within the hepatic parenchyma.
Probable component of diffuse intrahepatic biliary duct distension
with extrahepatic common duct fully visualized on this noncontrast
study. Gallbladder surgically absent.

Pancreas: No focal mass lesion. No dilatation of the main duct. No
intraparenchymal cyst. No peripancreatic edema.

Spleen: No splenomegaly. No focal mass lesion.

Adrenals/Urinary Tract: No adrenal nodule or mass. Kidneys are
atrophic. No evidence for hydroureter. The urinary bladder appears
normal for the degree of distention.

Stomach/Bowel: Small hiatal hernia. Stomach otherwise unremarkable.
Duodenum is normally positioned as is the ligament of Treitz. No
small bowel wall thickening. No small bowel dilatation. The terminal
ileum is normal. The appendix is not visualized, but there is no
edema or inflammation in the region of the cecum. Diverticuli are
seen scattered along the entire length of the colon without CT
findings of diverticulitis.

Vascular/Lymphatic: There is abdominal aortic atherosclerosis
without aneurysm. There is no gastrohepatic or hepatoduodenal
ligament lymphadenopathy. No intraperitoneal or retroperitoneal
lymphadenopathy. No pelvic sidewall lymphadenopathy.

Reproductive: Uterus surgically absent.  There is no adnexal mass.

Other: No intraperitoneal free fluid.

Musculoskeletal: Bone windows reveal no worrisome lytic or sclerotic
osseous lesions. Superior endplate Schmorl's node seen at L2.
IMPRESSION: 1. No acute findings in the abdomen or pelvis on this noncontrast
exam.
2. Diffuse intra and extrahepatic biliary duct prominence.
3. Small hiatal hernia.
4. Atrophic kidneys. Enhancing renal lesions seen on previous exam
not evident on today's noncontrast study.
5.  Aortic Atherosclerois ([id number]-170.0)
6. Diffuse colonic diverticulosis.

## 2018-01-17 ENCOUNTER — Emergency Department: Payer: Medicare Other

## 2018-01-17 ENCOUNTER — Encounter: Payer: Self-pay | Admitting: Emergency Medicine

## 2018-01-17 ENCOUNTER — Other Ambulatory Visit: Payer: Self-pay

## 2018-01-17 ENCOUNTER — Inpatient Hospital Stay
Admission: EM | Admit: 2018-01-17 | Discharge: 2018-01-21 | DRG: 073 | Disposition: A | Discharge status: Home / Self Care | Payer: Medicare Other | Attending: Internal Medicine | Admitting: Internal Medicine

## 2018-01-17 DIAGNOSIS — Z87891 Personal history of nicotine dependence: Secondary | ICD-10-CM

## 2018-01-17 DIAGNOSIS — Z7951 Long term (current) use of inhaled steroids: Secondary | ICD-10-CM

## 2018-01-17 DIAGNOSIS — R1084 Generalized abdominal pain: Secondary | ICD-10-CM

## 2018-01-17 DIAGNOSIS — E1143 Type 2 diabetes mellitus with diabetic autonomic (poly)neuropathy: Secondary | ICD-10-CM | POA: Diagnosis not present

## 2018-01-17 DIAGNOSIS — E11319 Type 2 diabetes mellitus with unspecified diabetic retinopathy without macular edema: Secondary | ICD-10-CM | POA: Diagnosis present

## 2018-01-17 DIAGNOSIS — I5032 Chronic diastolic (congestive) heart failure: Secondary | ICD-10-CM | POA: Diagnosis present

## 2018-01-17 DIAGNOSIS — I252 Old myocardial infarction: Secondary | ICD-10-CM

## 2018-01-17 DIAGNOSIS — N2581 Secondary hyperparathyroidism of renal origin: Secondary | ICD-10-CM | POA: Diagnosis present

## 2018-01-17 DIAGNOSIS — Z85528 Personal history of other malignant neoplasm of kidney: Secondary | ICD-10-CM

## 2018-01-17 DIAGNOSIS — D631 Anemia in chronic kidney disease: Secondary | ICD-10-CM | POA: Diagnosis present

## 2018-01-17 DIAGNOSIS — H9190 Unspecified hearing loss, unspecified ear: Secondary | ICD-10-CM | POA: Diagnosis present

## 2018-01-17 DIAGNOSIS — J449 Chronic obstructive pulmonary disease, unspecified: Secondary | ICD-10-CM | POA: Diagnosis present

## 2018-01-17 DIAGNOSIS — R112 Nausea with vomiting, unspecified: Secondary | ICD-10-CM

## 2018-01-17 DIAGNOSIS — Z79899 Other long term (current) drug therapy: Secondary | ICD-10-CM

## 2018-01-17 DIAGNOSIS — R197 Diarrhea, unspecified: Secondary | ICD-10-CM

## 2018-01-17 DIAGNOSIS — K3184 Gastroparesis: Secondary | ICD-10-CM | POA: Diagnosis present

## 2018-01-17 DIAGNOSIS — Z794 Long term (current) use of insulin: Secondary | ICD-10-CM

## 2018-01-17 DIAGNOSIS — F329 Major depressive disorder, single episode, unspecified: Secondary | ICD-10-CM | POA: Diagnosis present

## 2018-01-17 DIAGNOSIS — I340 Nonrheumatic mitral (valve) insufficiency: Secondary | ICD-10-CM | POA: Diagnosis present

## 2018-01-17 DIAGNOSIS — E785 Hyperlipidemia, unspecified: Secondary | ICD-10-CM | POA: Diagnosis present

## 2018-01-17 DIAGNOSIS — Z992 Dependence on renal dialysis: Secondary | ICD-10-CM

## 2018-01-17 DIAGNOSIS — K219 Gastro-esophageal reflux disease without esophagitis: Secondary | ICD-10-CM | POA: Diagnosis present

## 2018-01-17 DIAGNOSIS — I2510 Atherosclerotic heart disease of native coronary artery without angina pectoris: Secondary | ICD-10-CM | POA: Diagnosis present

## 2018-01-17 DIAGNOSIS — E1122 Type 2 diabetes mellitus with diabetic chronic kidney disease: Secondary | ICD-10-CM | POA: Diagnosis present

## 2018-01-17 DIAGNOSIS — E11649 Type 2 diabetes mellitus with hypoglycemia without coma: Secondary | ICD-10-CM | POA: Diagnosis not present

## 2018-01-17 DIAGNOSIS — N186 End stage renal disease: Secondary | ICD-10-CM | POA: Diagnosis present

## 2018-01-17 DIAGNOSIS — Z89412 Acquired absence of left great toe: Secondary | ICD-10-CM

## 2018-01-17 DIAGNOSIS — Z7902 Long term (current) use of antithrombotics/antiplatelets: Secondary | ICD-10-CM

## 2018-01-17 DIAGNOSIS — I132 Hypertensive heart and chronic kidney disease with heart failure and with stage 5 chronic kidney disease, or end stage renal disease: Secondary | ICD-10-CM | POA: Diagnosis present

## 2018-01-17 LAB — COMPREHENSIVE METABOLIC PANEL
ALT: 15 U/L (ref 14–54)
AST: 20 U/L (ref 15–41)
Albumin: 3.7 g/dL (ref 3.5–5.0)
Alkaline Phosphatase: 100 U/L (ref 38–126)
Anion gap: 12 (ref 5–15)
BUN: 33 mg/dL — ABNORMAL HIGH (ref 6–20)
CO2: 28 mmol/L (ref 22–32)
Calcium: 8 mg/dL — ABNORMAL LOW (ref 8.9–10.3)
Chloride: 91 mmol/L — ABNORMAL LOW (ref 101–111)
Creatinine, Ser: 6.81 mg/dL — ABNORMAL HIGH (ref 0.44–1.00)
GFR calc Af Amer: 7 mL/min — ABNORMAL LOW (ref >60)
GFR calc non Af Amer: 6 mL/min — ABNORMAL LOW (ref >60)
Glucose, Bld: 227 mg/dL — ABNORMAL HIGH (ref 65–99)
Potassium: 3.9 mmol/L (ref 3.5–5.1)
Sodium: 131 mmol/L — ABNORMAL LOW (ref 135–145)
Total Bilirubin: 0.8 mg/dL (ref 0.3–1.2)
Total Protein: 7.8 g/dL (ref 6.5–8.1)

## 2018-01-17 LAB — CBC
HCT: 30.3 % — ABNORMAL LOW (ref 35.0–47.0)
Hemoglobin: 10.1 g/dL — ABNORMAL LOW (ref 12.0–16.0)
MCH: 30 pg (ref 26.0–34.0)
MCHC: 33.2 g/dL (ref 32.0–36.0)
MCV: 90.4 fL (ref 80.0–100.0)
Platelets: 176 10*3/uL (ref 150–440)
RBC: 3.36 MIL/uL — ABNORMAL LOW (ref 3.80–5.20)
RDW: 15.1 % — ABNORMAL HIGH (ref 11.5–14.5)
WBC: 6.7 10*3/uL (ref 3.6–11.0)

## 2018-01-17 LAB — BRAIN NATRIURETIC PEPTIDE: B Natriuretic Peptide: 402 pg/mL — ABNORMAL HIGH (ref 0.0–100.0)

## 2018-01-17 LAB — MRSA PCR SCREENING: MRSA by PCR: NEGATIVE

## 2018-01-17 LAB — GLUCOSE, CAPILLARY: Glucose-Capillary: 169 mg/dL — ABNORMAL HIGH (ref 65–99)

## 2018-01-17 LAB — LIPASE, BLOOD: Lipase: 32 U/L (ref 11–51)

## 2018-01-17 LAB — TROPONIN I: Troponin I: <0.03 ng/mL (ref <0.03)

## 2018-01-17 MED ORDER — PROMETHAZINE HCL 25 MG/ML IJ SOLN
25.0000 mg | Freq: Once | INTRAMUSCULAR | Status: DC
  Filled 2018-01-17: qty 1

## 2018-01-17 MED ORDER — HYDROMORPHONE HCL 1 MG/ML IJ SOLN
1.0000 mg | Freq: Once | INTRAMUSCULAR | Status: AC
  Administered 2018-01-17: 1 mg via INTRAVENOUS
  Filled 2018-01-17: qty 1

## 2018-01-17 MED ORDER — PANCRELIPASE (LIP-PROT-AMYL) 12000-38000 UNITS PO CPEP: 24000.0000 [IU] | ORAL_CAPSULE | ORAL | Status: DC

## 2018-01-17 MED ORDER — PROMETHAZINE HCL 25 MG/ML IJ SOLN
25.0000 mg | Freq: Once | INTRAMUSCULAR | Status: AC
  Administered 2018-01-17: 25 mg via INTRAVENOUS
  Filled 2018-01-17: qty 1

## 2018-01-17 MED ORDER — PANCRELIPASE (LIP-PROT-AMYL) 12000-38000 UNITS PO CPEP
36000.0000 [IU] | ORAL_CAPSULE | Freq: Every day | ORAL | Status: DC
  Administered 2018-01-21: 36000 [IU] via ORAL
  Filled 2018-01-17 (×5): qty 3

## 2018-01-17 MED ORDER — SEVELAMER CARBONATE 800 MG PO TABS
800.0000 mg | ORAL_TABLET | ORAL | Status: DC
Start: 2018-01-17 — End: 2018-01-18

## 2018-01-17 MED ORDER — CLOPIDOGREL BISULFATE 75 MG PO TABS
75.0000 mg | ORAL_TABLET | Freq: Every day | ORAL | Status: DC
  Administered 2018-01-19 – 2018-01-20 (×2): 75 mg via ORAL
  Filled 2018-01-17 (×3): qty 1

## 2018-01-17 MED ORDER — INSULIN DETEMIR 100 UNIT/ML ~~LOC~~ SOLN
6.0000 [IU] | Freq: Every day | SUBCUTANEOUS | Status: DC
  Administered 2018-01-17: 6 [IU] via SUBCUTANEOUS
  Filled 2018-01-17 (×2): qty 0.06

## 2018-01-17 MED ORDER — BIOTIN 1 MG PO CAPS
1.0000 mg | ORAL_CAPSULE | Freq: Every day | ORAL | Status: DC
Start: 2018-01-17 — End: 2018-01-17

## 2018-01-17 MED ORDER — RIFAXIMIN 550 MG PO TABS
550.0000 mg | ORAL_TABLET | Freq: Two times a day (BID) | ORAL | Status: DC
  Administered 2018-01-17 – 2018-01-20 (×6): 550 mg via ORAL
  Filled 2018-01-17 (×8): qty 1

## 2018-01-17 MED ORDER — CHOLECALCIFEROL 10 MCG (400 UNIT) PO TABS
400.0000 [IU] | ORAL_TABLET | Freq: Every day | ORAL | Status: DC
Start: 2018-01-17 — End: 2018-01-21
  Administered 2018-01-19 – 2018-01-20 (×2): 400 [IU] via ORAL
  Filled 2018-01-17 (×5): qty 1

## 2018-01-17 MED ORDER — RISAQUAD PO CAPS
1.0000 | ORAL_CAPSULE | Freq: Every day | ORAL | Status: DC
  Administered 2018-01-19 – 2018-01-20 (×2): 1 via ORAL
  Filled 2018-01-17 (×4): qty 1

## 2018-01-17 MED ORDER — ATORVASTATIN CALCIUM 20 MG PO TABS
20.0000 mg | ORAL_TABLET | Freq: Every evening | ORAL | Status: DC
  Administered 2018-01-18: 20 mg via ORAL
  Filled 2018-01-17 (×2): qty 1

## 2018-01-17 MED ORDER — PANCRELIPASE (LIP-PROT-AMYL) 12000-38000 UNITS PO CPEP
24000.0000 [IU] | ORAL_CAPSULE | Freq: Every day | ORAL | Status: DC
  Administered 2018-01-20: 24000 [IU] via ORAL
  Filled 2018-01-17 (×4): qty 2

## 2018-01-17 MED ORDER — HYDROMORPHONE HCL 1 MG/ML IJ SOLN
1.0000 mg | Freq: Once | INTRAMUSCULAR | Status: DC
  Filled 2018-01-17: qty 1

## 2018-01-17 MED ORDER — HYDROCODONE-ACETAMINOPHEN 5-325 MG PO TABS
1.0000 | ORAL_TABLET | Freq: Four times a day (QID) | ORAL | Status: DC | PRN
  Administered 2018-01-18: 1 via ORAL
  Filled 2018-01-17: qty 1

## 2018-01-17 MED ORDER — ONDANSETRON HCL 4 MG/2ML IJ SOLN
4.0000 mg | Freq: Four times a day (QID) | INTRAMUSCULAR | Status: DC | PRN
  Administered 2018-01-18: 4 mg via INTRAVENOUS
  Filled 2018-01-17: qty 2

## 2018-01-17 MED ORDER — HYDROMORPHONE HCL 1 MG/ML IJ SOLN
1.0000 mg | Freq: Once | INTRAMUSCULAR | Status: AC
  Administered 2018-01-17: 1 mg via INTRAMUSCULAR

## 2018-01-17 MED ORDER — PROMETHAZINE HCL 25 MG RE SUPP: 25.0000 mg | Freq: Four times a day (QID) | RECTAL | Status: DC | PRN

## 2018-01-17 MED ORDER — VITAMIN B-12 1000 MCG PO TABS
1000.0000 ug | ORAL_TABLET | Freq: Every day | ORAL | Status: DC
  Administered 2018-01-19 – 2018-01-20 (×2): 1000 ug via ORAL
  Filled 2018-01-17 (×4): qty 1

## 2018-01-17 MED ORDER — DOCUSATE SODIUM 100 MG PO CAPS: 100.0000 mg | ORAL_CAPSULE | Freq: Two times a day (BID) | ORAL | Status: DC | PRN

## 2018-01-17 MED ORDER — PROMETHAZINE HCL 25 MG/ML IJ SOLN
25.0000 mg | Freq: Once | INTRAMUSCULAR | Status: AC
  Administered 2018-01-17: 25 mg via INTRAMUSCULAR

## 2018-01-17 MED ORDER — ALBUTEROL SULFATE (2.5 MG/3ML) 0.083% IN NEBU: 2.5000 mg | INHALATION_SOLUTION | RESPIRATORY_TRACT | Status: DC | PRN

## 2018-01-17 MED ORDER — SEVELAMER CARBONATE 800 MG PO TABS
2400.0000 mg | ORAL_TABLET | Freq: Three times a day (TID) | ORAL | Status: DC
  Filled 2018-01-17: qty 3

## 2018-01-17 MED ORDER — SEVELAMER CARBONATE 800 MG PO TABS: 800.0000 mg | ORAL_TABLET | ORAL | Status: DC

## 2018-01-17 MED ORDER — INSULIN ASPART 100 UNIT/ML ~~LOC~~ SOLN
0.0000 [IU] | Freq: Three times a day (TID) | SUBCUTANEOUS | Status: DC
  Administered 2018-01-21: 3 [IU] via SUBCUTANEOUS
  Filled 2018-01-17: qty 1

## 2018-01-17 MED ORDER — HEPARIN SODIUM (PORCINE) 5000 UNIT/ML IJ SOLN
5000.0000 [IU] | Freq: Three times a day (TID) | INTRAMUSCULAR | Status: DC
  Administered 2018-01-17 – 2018-01-21 (×9): 5000 [IU] via SUBCUTANEOUS
  Filled 2018-01-17 (×9): qty 1

## 2018-01-17 MED ORDER — MECLIZINE HCL 25 MG PO TABS
25.0000 mg | ORAL_TABLET | Freq: Four times a day (QID) | ORAL | Status: DC | PRN
  Administered 2018-01-17: 25 mg via ORAL
  Filled 2018-01-17 (×2): qty 1

## 2018-01-17 MED ORDER — ALBUTEROL SULFATE HFA 108 (90 BASE) MCG/ACT IN AERS: 2.0000 | INHALATION_SPRAY | Freq: Four times a day (QID) | RESPIRATORY_TRACT | Status: DC | PRN

## 2018-01-17 MED ORDER — CARVEDILOL 25 MG PO TABS
25.0000 mg | ORAL_TABLET | Freq: Two times a day (BID) | ORAL | Status: DC
  Administered 2018-01-18 – 2018-01-21 (×3): 25 mg via ORAL
  Filled 2018-01-17 (×5): qty 1

## 2018-01-17 MED ORDER — NITROGLYCERIN 0.4 MG SL SUBL: 0.4000 mg | SUBLINGUAL_TABLET | SUBLINGUAL | Status: DC | PRN

## 2018-01-17 MED ORDER — RENA-VITE PO TABS
1.0000 | ORAL_TABLET | Freq: Every day | ORAL | Status: DC
  Administered 2018-01-19 – 2018-01-20 (×2): 1 via ORAL
  Filled 2018-01-17 (×4): qty 1

## 2018-01-17 MED ORDER — PROMETHAZINE HCL 25 MG/ML IJ SOLN
25.0000 mg | Freq: Four times a day (QID) | INTRAMUSCULAR | Status: DC | PRN
  Administered 2018-01-18 – 2018-01-20 (×3): 25 mg via INTRAVENOUS
  Filled 2018-01-17 (×3): qty 1

## 2018-01-17 MED ORDER — ASPIRIN EC 81 MG PO TBEC
81.0000 mg | DELAYED_RELEASE_TABLET | Freq: Every day | ORAL | Status: DC
  Administered 2018-01-19 – 2018-01-20 (×2): 81 mg via ORAL
  Filled 2018-01-17 (×4): qty 1

## 2018-01-17 MED ORDER — AMLODIPINE BESYLATE 10 MG PO TABS
10.0000 mg | ORAL_TABLET | Freq: Every day | ORAL | Status: DC
  Filled 2018-01-17 (×2): qty 1

## 2018-01-17 MED ORDER — SODIUM CHLORIDE 0.9 % IV BOLUS: 500.0000 mL | Freq: Once | INTRAVENOUS | Status: DC

## 2018-01-17 MED ORDER — PANCRELIPASE (LIP-PROT-AMYL) 12000-38000 UNITS PO CPEP
36000.0000 [IU] | ORAL_CAPSULE | Freq: Every day | ORAL | Status: DC
  Administered 2018-01-18 – 2018-01-20 (×3): 36000 [IU] via ORAL
  Filled 2018-01-17 (×3): qty 3
  Filled 2018-01-17: qty 1

## 2018-01-17 NOTE — ED Notes (Signed)
Iv attempted x1 by this nurse and x1 by Cyndie Mull RN - charge nurse aware and will attempt IV ultrasound Pt also had large BM - was turned, changed, repositioned

## 2018-01-17 NOTE — ED Notes (Signed)
Pt had large BM - cleaned, dried, changed, repositioned

## 2018-01-17 NOTE — ED Triage Notes (Signed)
Pt arrived via EMS from home with reports of abdominal pain. Pt arrived yelling and screaming in pain. Pt has hx of pancreatitis.   Pt is on dialysis MWF. Pt dry heaving in room. Spits up phlegm occasionally.

## 2018-01-17 NOTE — ED Notes (Signed)
Per Dr Cherylann Banas IV phenergan and dilaudid changed to IM

## 2018-01-17 NOTE — H&P (Signed)
[facility] [place] NAME: [name]    MR#:  [id number]  DATE OF BIRTH:  October 24, 1960  DATE OF ADMISSION:  01/17/2018  PRIMARY CARE PHYSICIAN: Freddy Finner, NP   REQUESTING/REFERRING PHYSICIAN: [place]  CHIEF COMPLAINT:   Chief Complaint  Patient presents with  . Abdominal Pain    HISTORY OF PRESENT ILLNESS: [name]  is a 57 y.o. female with a known history of ESRD on HD, Anxiety, Asthma, Dm, Depression, MI, GERD- Had recurrent Nausea and vomiting. Came again with episode of nausea and vomit since morning and had 3 diarrhea episodes. Have abd pain. ER physician gave for admission. For gastroperesis.  PAST MEDICAL HISTORY:   Past Medical History:  Diagnosis Date  . Anemia   . Anginal pain ([place])   . Anxiety   . Arthritis   . Asthma   . Broken wrist   . Bronchitis   . chronic diastolic CHF 02/21/931  . COPD (chronic obstructive pulmonary disease) ([place])   . Coronary artery disease    a. cath 2013: stenting to RCA (report not available); b. cath 2014: LM nl, pLAD 40%, mLAD nl, ost LCx 40%, mid LCx nl, pRCA 30% @ site of prior stent, mRCA 50%  . Depression   . Diabetes mellitus without complication ([place])   . Diabetic neuropathy ([place])   . dialysis 2006  . Diverticulosis   . Dizziness   . Dyspnea   . Elevated lipids   . Environmental and seasonal allergies   . ESRD (end stage renal disease) on dialysis ([place])    M-W-F  . GERD (gastroesophageal reflux disease)   . Headache   . History of hiatal hernia   . HOH (hard of hearing)   . Hx of pancreatitis 2015  . Hypertension   . Lower extremity edema   . Mitral regurgitation    a. echo 10/2013: EF 62%, noWMA, mildly dilated LA, mild to mod MR/TR, GR1DD  . Myocardial infarction ([place])   . Orthopnea   . Pneumonia   . Renal cancer ([place])   . Renal insufficiency    Pt is on dialysis on M,W + F.  . Wheezing     PAST SURGICAL HISTORY:  Past Surgical History:  Procedure Laterality  Date  . ABDOMINAL HYSTERECTOMY  1992  . AMPUTATION TOE Left 10/02/2017   Procedure: AMPUTATION TOE-LEFT GREAT TOE;  Surgeon: Albertine Patricia, DPM;  Location: [facility] ORS;  Service: Podiatry;  Laterality: Left;  . APPENDECTOMY    . CARDIAC CATHETERIZATION Left 07/26/2015   Procedure: Left Heart Cath and Coronary Angiography;  Surgeon: Dionisio David, MD;  Location: [place] [facility];  Service: Cardiovascular;  Laterality: Left;  . CATARACT EXTRACTION W/ INTRAOCULAR LENS IMPLANT Right   . CATARACT EXTRACTION W/PHACO Left 03/10/2017   Procedure: CATARACT EXTRACTION PHACO AND INTRAOCULAR LENS PLACEMENT (IOC);  Surgeon: Birder Robson, MD;  Location: [facility] ORS;  Service: Ophthalmology;  Laterality: Left;  [place] 00:51.9 AP% 14.2 CDE 7.39 fluid pack lot # [id number]  . CHOLECYSTECTOMY    . COLONOSCOPY WITH PROPOFOL N/A 08/12/2016   Procedure: COLONOSCOPY WITH PROPOFOL;  Surgeon: Lollie Sails, MD;  Location: [facility] ENDOSCOPY;  Service: Endoscopy;  Laterality: N/A;  . DIALYSIS FISTULA CREATION Left    upper arm  . dialysis grafts    . ESOPHAGOGASTRODUODENOSCOPY N/A 03/08/2015   Procedure: ESOPHAGOGASTRODUODENOSCOPY (EGD);  Surgeon: Manya Silvas, MD;  Location: [facility] ENDOSCOPY;  Service: Endoscopy;  Laterality: N/A;  . ESOPHAGOGASTRODUODENOSCOPY (  EGD) WITH PROPOFOL N/A 03/18/2016   Procedure: ESOPHAGOGASTRODUODENOSCOPY (EGD) WITH PROPOFOL;  Surgeon: [name], MD;  Location: [facility] ENDOSCOPY;  Service: Endoscopy;  Laterality: N/A;  . EYE SURGERY Right 2018  . FECAL TRANSPLANT N/A 08/23/2015   Procedure: FECAL TRANSPLANT;  Surgeon: Manya Silvas, MD;  Location: [facility] ENDOSCOPY;  Service: Endoscopy;  Laterality: N/A;  . HAND SURGERY Bilateral   . PERIPHERAL VASCULAR CATHETERIZATION N/A 12/20/2015   Procedure: Thrombectomy of dialysis access versus permcath placement;  Surgeon: Algernon Huxley, MD;  Location: [place] [facility];  Service: Cardiovascular;  Laterality: N/A;  . PERIPHERAL VASCULAR  CATHETERIZATION N/A 12/20/2015   Procedure: A/V Shunt Intervention;  Surgeon: Algernon Huxley, MD;  Location: [place] [facility];  Service: Cardiovascular;  Laterality: N/A;  . PERIPHERAL VASCULAR CATHETERIZATION N/A 12/20/2015   Procedure: A/V Shuntogram/Fistulagram;  Surgeon: Algernon Huxley, MD;  Location: [place] [facility];  Service: Cardiovascular;  Laterality: N/A;  . PERIPHERAL VASCULAR CATHETERIZATION N/A 01/02/2016   Procedure: A/V Shuntogram/Fistulagram;  Surgeon: Algernon Huxley, MD;  Location: [place] [facility];  Service: Cardiovascular;  Laterality: N/A;  . PERIPHERAL VASCULAR CATHETERIZATION N/A 01/02/2016   Procedure: A/V Shunt Intervention;  Surgeon: Algernon Huxley, MD;  Location: [place] [facility];  Service: Cardiovascular;  Laterality: N/A;    SOCIAL HISTORY:  Social History   Tobacco Use  . Smoking status: Former Smoker    Packs/day: 0.50    Years: 40.00    Pack years: 20.00    Types: Cigarettes    Last attempt to quit: 02/13/2015    Years since quitting: 2.9  . Smokeless tobacco: Never Used  Substance Use Topics  . Alcohol use: Yes    Alcohol/week: 0.6 oz    Types: 1 Glasses of wine per week    Comment: glass wine week per pt    FAMILY HISTORY:  Family History  Problem Relation Age of Onset  . Kidney disease Mother   . Diabetes Mother   . Cancer Father   . Kidney disease Sister     DRUG ALLERGIES:  Allergies  Allergen Reactions  . Ace Inhibitors Swelling and Anaphylaxis  . Ativan [Lorazepam] Other (See Comments)    Reaction:Hallucinations and headaches  . Compazine [Prochlorperazine Edisylate] Anaphylaxis, Nausea And Vomiting and Other (See Comments)    Other reaction(s): dystonia from this vs. Reglan, 23 Jul - patient relates that she takes promethazine frequently with no problems  . Sumatriptan Succinate Other (See Comments)    Other reaction(s): delirium and hallucinations per [facility] records  . Dilaudid [Hydromorphone Hcl] Other (See Comments)     Delirium   . Ondansetron Other (See Comments)    hallucinations    . Zofran [Ondansetron Hcl] Other (See Comments)    Reaction:  hallucinations   . Codeine Nausea And Vomiting  . Gabapentin Other (See Comments)    Reaction:  Unknown    . [place] Nausea And Vomiting  . Losartan Nausea Only  . Oxycodone Anxiety  . Prochlorperazine Other (See Comments)    Reaction:  Unknown . Patient does not remember reaction but she does have vertigo and anxiety along with n and v at times. Could be used to treat any of these   . Reglan [Metoclopramide] Other (See Comments)    Per patient her Dr. Evelina Bucy her off it   . Scopolamine Other (See Comments)    Dizziness, also has vertigo already  . Tape Rash  . Tapentadol Rash  REVIEW OF SYSTEMS:   CONSTITUTIONAL: No fever, fatigue or weakness.  EYES: No blurred or double vision.  EARS, NOSE, AND THROAT: No tinnitus or ear pain.  RESPIRATORY: No cough, shortness of breath, wheezing or hemoptysis.  CARDIOVASCULAR: No chest pain, orthopnea, edema.  GASTROINTESTINAL: have nausea, vomiting, diarrhea or abdominal pain.  GENITOURINARY: No dysuria, hematuria.  ENDOCRINE: No polyuria, nocturia,  HEMATOLOGY: No anemia, easy bruising or bleeding SKIN: No rash or lesion. MUSCULOSKELETAL: No joint pain or arthritis.   NEUROLOGIC: No tingling, numbness, weakness.  PSYCHIATRY: No anxiety or depression.   MEDICATIONS AT HOME:  Prior to Admission medications   Medication Sig Start Date End Date Taking? Authorizing Provider  acidophilus (RISAQUAD) CAPS capsule Take 1 capsule by mouth daily.   Yes [provider]  albuterol (PROVENTIL HFA;VENTOLIN HFA) 108 (90 Base) MCG/ACT inhaler Inhale 2 puffs into the lungs every 6 (six) hours as needed for wheezing or shortness of breath.    Yes [provider]  albuterol (PROVENTIL) (2.5 MG/3ML) 0.083% nebulizer solution Take 2.5 mg by nebulization every 4 (four) hours as needed for wheezing or shortness  of breath.   Yes [provider]  amLODipine (NORVASC) 10 MG tablet Take 10 mg by mouth daily.   Yes [provider]  aspirin EC 81 MG tablet Take 81 mg by mouth daily. 09/08/16  Yes [provider]  atorvastatin (LIPITOR) 20 MG tablet Take 20 mg by mouth every evening.  12/02/16  Yes [provider]  Biotin 1 MG CAPS Take 1 mg by mouth daily.   Yes [provider]  carvedilol (COREG) 25 MG tablet Take 25 mg by mouth 2 (two) times daily with a meal.   Yes [provider]  cetirizine (ZYRTEC) 10 MG tablet Take 10 mg by mouth daily.    Yes [provider]  cholecalciferol (VITAMIN D) 400 units TABS tablet Take 400 Units by mouth daily.   Yes [provider]  clopidogrel (PLAVIX) 75 MG tablet Take 1 tablet (75 mg total) by mouth daily. 03/25/17 03/25/18 Yes Dustin Flock, MD  clotrimazole (LOTRIMIN) 1 % cream Apply 1 application topically 2 (two) times daily.   Yes [provider]  cyanocobalamin 1000 MCG tablet Take 1,000 mcg by mouth daily.    Yes [provider]  dexlansoprazole (DEXILANT) 60 MG capsule Take 60 mg by mouth daily.   Yes [provider]  diclofenac sodium (VOLTAREN) 1 % GEL Apply 1 application topically daily as needed (for rash).   Yes [provider]  FLUoxetine (PROZAC) 20 MG capsule Take 1 capsule (20 mg total) by mouth daily. Patient taking differently: Take 20 mg by mouth at bedtime.  04/10/16  Yes Hower, Aaron Mose, MD  glucose 4 GM chewable tablet Chew 3-4 tablets by mouth as needed for low blood sugar (blood sugar less than 60).    Yes [provider]  HYDROcodone-acetaminophen (NORCO/VICODIN) 5-325 MG tablet Take 1 tablet by mouth every 6 (six) hours as needed for moderate pain or severe pain. Patient taking differently: Take 1 tablet by mouth 3 (three) times daily as needed for moderate pain or severe pain.  05/05/17  Yes Gouru, Illene Silver, MD  insulin aspart (NOVOLOG  FLEXPEN) 100 UNIT/ML FlexPen Inject 4-15 Units into the skin 4 (four) times daily as needed for high blood sugar (> 100, per sliding scale).   Yes [provider]  insulin detemir (LEVEMIR) 100 UNIT/ML injection Inject 0.02 mLs (2 Units total) into the skin  2 (two) times daily. Patient taking differently: Inject 6 Units into the skin at bedtime.  09/05/17  Yes Epifanio Lesches, MD  lipase/protease/amylase (CREON) 12000 UNITS CPEP capsule Take 24,000-36,000 Units by mouth See admin instructions. Take 36000 mg in the morning, 24000 midday & 36000 in the evening   Yes [provider]  meclizine (ANTIVERT) 25 MG tablet Take 25 mg by mouth every 6 (six) hours as needed for dizziness.   Yes [provider]  multivitamin (RENA-VIT) TABS tablet Take 1 tablet by mouth daily.    Yes [provider]  promethazine (PHENERGAN) 25 MG suppository Place 1 suppository (25 mg total) rectally every 6 (six) hours as needed for nausea. 10/09/17 10/09/18 Yes Harvest Dark, MD  promethazine (PHENERGAN) 25 MG tablet Take 0.5 tablets (12.5 mg total) by mouth every 6 (six) hours as needed for nausea or vomiting. 10/09/17  Yes Harvest Dark, MD  ranitidine (ZANTAC) 300 MG tablet Take 450 mg by mouth at bedtime.    Yes [provider]  rifaximin (XIFAXAN) 550 MG TABS tablet Take 550 mg by mouth 2 (two) times daily.   Yes [provider]  sevelamer carbonate (RENVELA) 800 MG tablet Take 800-2,400 mg by mouth See admin instructions. Take 2400 mg by mouth 3 times daily with meals and take 800 mg by mouth with snacks   Yes [provider]  alum & mag hydroxide-simeth (MAALOX MAX) 400-400-40 MG/5ML suspension Take 5 mLs by mouth every 6 (six) hours as needed for indigestion. Patient not taking: Reported on 10/09/2017 06/09/17   Rudene Re, MD  cyclobenzaprine (FLEXERIL) 5 MG tablet Take 1 tablet (5 mg total) by mouth 3 (three) times daily as needed for  muscle spasms. Patient not taking: Reported on 10/27/2017 10/15/17   Merlyn Lot, MD  doxycycline (VIBRA-TABS) 100 MG tablet Take 1 tablet (100 mg total) by mouth every 12 (twelve) hours. Patient not taking: Reported on 10/09/2017 09/30/2017   Salary, Holly Bodily D, MD  famotidine (PEPCID) 20 MG tablet Take 1 tablet (20 mg total) by mouth 2 (two) times daily. Patient not taking: Reported on 09/30/2017 06/09/17 06/09/18  Rudene Re, MD  methocarbamol (ROBAXIN) 500 MG tablet Take 1 tablet (500 mg total) by mouth 4 (four) times daily. Patient not taking: Reported on 10/27/2017 10/23/17   Rudene Re, MD  metoCLOPramide (REGLAN) 10 MG tablet Take 1 tablet (10 mg total) by mouth every 8 (eight) hours as needed for nausea or vomiting. Patient not taking: Reported on 10/07/2017 06/08/17   Earleen Newport, MD  nitroGLYCERIN (NITROSTAT) 0.4 MG SL tablet Place 0.4 mg under the tongue every 5 (five) minutes as needed for chest pain.     [provider]      PHYSICAL EXAMINATION:   VITAL SIGNS: Blood pressure 138/63, pulse 70, temperature 97.8 F (36.6 C), temperature source Oral, resp. rate 14, height 5\' 5"  (1.651 m), weight 81.6 kg (180 lb), SpO2 91 %.  GENERAL:  57 y.o.-year-old patient lying in the bed with no acute distress.  EYES: Pupils equal, round, reactive to light and accommodation. No scleral icterus. Extraocular muscles intact.  HEENT: Head atraumatic, normocephalic. Oropharynx and nasopharynx clear.  NECK:  Supple, no jugular venous distention. No thyroid enlargement, no tenderness.  LUNGS: Normal breath sounds bilaterally, no wheezing, rales,rhonchi or crepitation. No use of accessory muscles of respiration.  CARDIOVASCULAR: S1, S2 normal. No murmurs, rubs, or gallops.  ABDOMEN: Soft, tender, nondistended. Bowel sounds present. No organomegaly or mass.  EXTREMITIES: No  pedal edema, cyanosis, or clubbing.  NEUROLOGIC: Cranial nerves II through XII are intact. Muscle  strength 5/5 in all extremities. Sensation intact. Gait not checked.  PSYCHIATRIC: The patient is alert and oriented x 3.  SKIN: No obvious rash, lesion, or ulcer.   LABORATORY PANEL:   CBC Recent Labs  Lab 01/17/18 1304  WBC 6.7  HGB 10.1*  HCT 30.3*  PLT 176  MCV 90.4  MCH 30.0  MCHC 33.2  RDW 15.1*   ------------------------------------------------------------------------------------------------------------------  Chemistries  Recent Labs  Lab 01/17/18 1304  NA 131*  K 3.9  CL 91*  CO2 28  GLUCOSE 227*  BUN 33*  CREATININE 6.81*  CALCIUM 8.0*  AST 20  ALT 15  ALKPHOS 100  BILITOT 0.8   ------------------------------------------------------------------------------------------------------------------ estimated creatinine clearance is 9.5 mL/min (A) (by C-G formula based on SCr of 6.81 mg/dL (H)). ------------------------------------------------------------------------------------------------------------------ No results for input(s): TSH, T4TOTAL, T3FREE, THYROIDAB in the last 72 hours.  Invalid input(s): FREET3   Coagulation profile No results for input(s): INR, PROTIME in the last 168 hours. ------------------------------------------------------------------------------------------------------------------- No results for input(s): DDIMER in the last 72 hours. -------------------------------------------------------------------------------------------------------------------  Cardiac Enzymes Recent Labs  Lab 01/17/18 1304  TROPONINI <0.03   ------------------------------------------------------------------------------------------------------------------ Invalid input(s): POCBNP  ---------------------------------------------------------------------------------------------------------------  Urinalysis    Component Value Date/Time   COLORURINE YELLOW (A) 06/09/2017 1039   APPEARANCEUR CLEAR (A) 06/09/2017 1039   APPEARANCEUR Clear 09/15/2014 0947    LABSPEC 1.014 06/09/2017 1039   LABSPEC 1.008 09/15/2014 0947   PHURINE 8.0 06/09/2017 1039   GLUCOSEU 150 (A) 06/09/2017 1039   GLUCOSEU 150 mg/dL 09/15/2014 0947   HGBUR SMALL (A) 06/09/2017 1039   BILIRUBINUR NEGATIVE 06/09/2017 1039   BILIRUBINUR Negative 09/15/2014 0947   KETONESUR NEGATIVE 06/09/2017 1039   PROTEINUR >=300 (A) 06/09/2017 1039   UROBILINOGEN 0.2 06/30/2010 2130   NITRITE NEGATIVE 06/09/2017 1039   LEUKOCYTESUR NEGATIVE 06/09/2017 1039   LEUKOCYTESUR Negative 09/15/2014 0947     RADIOLOGY: Ct Abdomen Pelvis Wo Contrast  Result Date: 01/17/2018 CLINICAL DATA:  Abdominal pain. EXAM: CT ABDOMEN AND PELVIS WITHOUT CONTRAST TECHNIQUE: Multidetector CT imaging of the abdomen and pelvis was performed following the standard protocol without IV contrast. COMPARISON:  10/27/2017 FINDINGS: Lower chest: The lung bases are clear of an acute process. There is some patchy atelectasis but no effusions. The heart is mildly enlarged. No pericardial effusion. Age advanced aortic calcifications. Small hiatal hernia. Hepatobiliary: No focal hepatic lesions or intrahepatic biliary dilatation. The gallbladder is surgically absent. No significant common bile duct dilatation. Pancreas: No mass, inflammation or ductal dilatation. Spleen: Normal size.  No focal lesions. Adrenals/Urinary Tract: The adrenal glands are unremarkable Both kidneys are quite small and there are extensive vascular calcifications consistent with chronic renal disease. No renal lesions. Stomach/Bowel: The stomach, duodenum, small bowel and colon are grossly normal without oral contrast. No acute inflammatory changes, mass lesions or obstructive findings. The terminal ileum is normal. Colonic diverticulosis without findings for acute diverticulitis. Vascular/Lymphatic: Advanced vascular calcifications likely related to diabetes. No mesenteric or retroperitoneal mass or adenopathy. Reproductive: The uterus is surgically absent. I  believe the right ovary is still present. I do not see the left ovary for certain. Other: No free abdominal/pelvic fluid collections or free air. Musculoskeletal: No significant bony findings. Prominent Schmorl's nodes noted at L2 and L3. Partially fused T11-12. Borderline inguinal lymph nodes bilaterally are stable. IMPRESSION: 1. No acute abdominal/pelvic findings, mass lesions or adenopathy without IV or oral contrast. 2. Status post cholecystectomy.  No  biliary dilatation. 3. Bibasilar atelectasis. 4. Atrophied kidneys likely due to diabetic nephropathy and vascular disease. 5. Diverticulosis without findings for acute diverticulitis. Electronically Signed   By: Marijo Sanes M.D.   On: 01/17/2018 16:14   Dg Chest Portable 1 View  Result Date: 01/17/2018 CLINICAL DATA:  Severe epigastric pain and vomiting beginning today. EXAM: PORTABLE CHEST 1 VIEW COMPARISON:  10/23/2017 FINDINGS: The heart size and mediastinal contours are within normal limits. Both lungs are clear. Intravascular stent again seen in the left subclavian and axillary regions. IMPRESSION: No active disease. Electronically Signed   By: Earle Gell M.D.   On: 01/17/2018 12:55    EKG: Orders placed or performed during the hospital encounter of 01/17/18  . ED EKG  . ED EKG    IMPRESSION AND PLAN:  * Gastroperesis   IV Zofran and phenergan    * ESRD on HD   Nephrology to help.  * DM   On ISS and cont her basal insuline.  * Htn   Cont home meds    * Hyperlipidemia   Cont lipitor.  * CAD   Cont ASA, Plavix, Coreg, Statin.  * Ch pancreatitis   Cont Pancreatic enzymes.  All the records are reviewed and case discussed with ED provider. Management plans discussed with the patient, family and they are in agreement.  CODE STATUS: Full code. Code Status History    Date Active Date Inactive Code Status Order ID Comments User Context   09/30/2017 1804 10/22/2017 2238 Full Code [id number]  Vaughan Basta, MD  Inpatient   09/03/2017 2243 09/05/2017 1901 Full Code [id number]  Lance Coon, MD Inpatient   05/04/2017 1833 05/05/2017 2330 Full Code [id number]  Henreitta Leber, MD Inpatient   03/23/2017 1551 03/25/2017 1739 Full Code [id number]  Gladstone Lighter, MD Inpatient   04/21/2016 1559 04/21/2016 1717 Full Code [id number]  Nicholes Mango, MD Inpatient   04/08/2016 1415 04/11/2016 1947 Full Code [id number]  Theodoro Grist, MD Inpatient   03/15/2016 1322 03/20/2016 0019 Full Code [id number]  Bettey Costa, MD Inpatient   02/16/2016 1409 02/19/2016 2238 Full Code [id number]  Bettey Costa, MD ED   12/17/2015 1139 12/21/2015 2209 Full Code [id number]  Dustin Flock, MD ED   11/12/2015 1602 12/01/2015 1943 Full Code [id number]  Bettey Costa, MD Inpatient   10/07/2015 0937 10/12/2015 1959 Full Code [id number]  Fritzi Mandes, MD ED   10/03/2015 0355 10/05/2015 2230 Full Code [id number]  Lance Coon, MD Inpatient   08/31/2015 1600 09/03/2015 2209 Full Code [id number]  Demetrios Loll, MD ED   07/26/2015 1528 07/26/2015 2136 Full Code [id number]  Dionisio David, MD Inpatient   07/22/2015 1736 07/26/2015 1528 Full Code [id number]  Vaughan Basta, MD Inpatient   06/05/2015 2039 06/07/2015 2003 Full Code [id number]  Bettey Costa, MD Inpatient   05/21/2015 2142 05/24/2015 1626 Full Code [id number]  Henreitta Leber, MD Inpatient   04/24/2015 0855 04/26/2015 2211 Full Code [id number]  Demetrios Loll, MD Inpatient   04/01/2015 1558 04/05/2015 2007 Full Code [id number]  Epifanio Lesches, MD ED   03/06/2015 0805 03/09/2015 2111 Full Code [id number]  Juluis Mire, MD Inpatient   02/19/2015 1614 02/24/2015 1741 Full Code [id number]  Aldean Jewett, MD Inpatient       TOTAL TIME TAKING CARE OF THIS PATIENT: 45 minutes.    Vaughan Basta M.D on 01/17/2018   Between 7am to 6pm - Pager - [phone]  After 6pm go to [email or web address] - password [email or web address]  [employer]  Office  [phone]  CC: Primary care physician; Freddy Finner,  NP   Note: This dictation was prepared with Dragon dictation along with smaller phrase technology. Any transcriptional errors that result from this process are unintentional.

## 2018-01-17 NOTE — ED Notes (Signed)
Blood drawn by RT by arterial stick

## 2018-01-17 NOTE — ED Notes (Addendum)
[facility] RN unsuccessful with IV attempt - pt is requesting IV in neck - Dr Cherylann Banas notified - provider states that he will change orders to IM

## 2018-01-17 NOTE — ED Notes (Signed)
Dr Cherylann Banas started ultrasound guided IV but was unable to draw blood - provider requested that RT be called to perform arterial stick for blood samples - RT notified

## 2018-01-17 NOTE — ED Notes (Signed)
[facility] RN attempting IV with ultrasound

## 2018-01-17 NOTE — ED Provider Notes (Signed)
[facility] Emergency Department Provider Note ____________________________________________   First MD Initiated Contact with Patient 01/17/18 1119     (approximate)  I have reviewed the triage vital signs and the nursing notes.   HISTORY  Chief Complaint Abdominal Pain   HPI [name] is a 57 y.o. female with PMH as noted below including ESRD on dialysis M/W/F and prior history of pancreatitis who presents with vomiting, acute onset today, associated with upper abdominal pain.  She states it is similar to symptoms she has had before during ED visits, but worse.  She has not taken anything for it at home.  Past Medical History:  Diagnosis Date  . Anemia   . Anginal pain ([place])   . Anxiety   . Arthritis   . Asthma   . Broken wrist   . Bronchitis   . chronic diastolic CHF 2/94/7654  . COPD (chronic obstructive pulmonary disease) ([place])   . Coronary artery disease    a. cath 2013: stenting to RCA (report not available); b. cath 2014: LM nl, pLAD 40%, mLAD nl, ost LCx 40%, mid LCx nl, pRCA 30% @ site of prior stent, mRCA 50%  . Depression   . Diabetes mellitus without complication ([place])   . Diabetic neuropathy ([place])   . dialysis 2006  . Diverticulosis   . Dizziness   . Dyspnea   . Elevated lipids   . Environmental and seasonal allergies   . ESRD (end stage renal disease) on dialysis ([place])    M-W-F  . GERD (gastroesophageal reflux disease)   . Headache   . History of hiatal hernia   . HOH (hard of hearing)   . Hx of pancreatitis 2015  . Hypertension   . Lower extremity edema   . Mitral regurgitation    a. echo 10/2013: EF 62%, noWMA, mildly dilated LA, mild to mod MR/TR, GR1DD  . Myocardial infarction ([place])   . Orthopnea   . Pneumonia   . Renal cancer ([place])   . Renal insufficiency    Pt is on dialysis on M,W + F.  . Wheezing     Patient Active Problem List   Diagnosis Date Noted  . Complication of vascular access for dialysis  12/04/2017  . Osteomyelitis ([place]) 09/30/2017  . Carotid stenosis 06/18/2017  . Shortness of breath 05/04/2017  . Cellulitis of lower extremity 07/29/2016  . Chronic venous insufficiency 07/29/2016  . Lymphedema 07/29/2016  . TIA (transient ischemic attack) 04/21/2016  . Altered mental status 04/08/2016  . Hyperammonemia ([place]) 04/08/2016  . Elevated troponin 04/08/2016  . Depression 04/08/2016  . Depression, major, recurrent, severe with psychosis ([place]) 04/08/2016  . Blood in stool   . Intractable cyclical vomiting with nausea   . Reflux esophagitis   . Gastritis   . Generalized abdominal pain   . Uncontrollable vomiting   . Major depressive disorder, recurrent episode, moderate ([place]) 03/15/2016  . Adjustment disorder with mixed anxiety and depressed mood 03/15/2016  . Malnutrition of moderate degree 12/01/2015  . Renal mass   . Dyspnea   . Acute renal failure ([place])   . Respiratory failure ([place])   . High temperature 11/14/2015  . Pulmonary edema   . Encounter for central line placement   . Encounter for orogastric (OG) tube placement   . Nausea 11/12/2015  . Hyperkalemia 10/03/2015  . Diarrhea, unspecified 07/22/2015  . Pneumonia 05/21/2015  . Hypoglycemia 04/24/2015  . Unresponsiveness 04/24/2015  . Bradycardia 04/24/2015  . Hypothermia  04/24/2015  . Acute respiratory failure ([place]) 04/24/2015  . Acute diastolic CHF (congestive heart failure) ([place]) 04/05/2015  . Diabetic gastroparesis ([place]) 04/05/2015  . Hypokalemia 04/05/2015  . Generalized weakness 04/05/2015  . Acute pulmonary edema ([place]) 04/03/2015  . Nausea and vomiting 04/03/2015  . Hypoglycemia associated with diabetes ([place]) 04/03/2015  . Anemia of chronic disease 04/03/2015  . Secondary hyperparathyroidism ([place]) 04/03/2015  . Pressure ulcer 04/02/2015  . Acute respiratory failure with hypoxia ([place]) 04/01/2015  . Adjustment disorder with anxiety 03/14/2015  . Somatic symptom disorder, mild 03/08/2015  .  Coronary artery disease involving native coronary artery of native heart without angina pectoris   . Nausea & vomiting 03/06/2015  . Abdominal pain 03/06/2015  . DM (diabetes mellitus) ([place]) 03/06/2015  . HTN (hypertension) 03/06/2015  . Gastroparesis 02/24/2015  . Pleural effusion 02/19/2015  . HCAP (healthcare-associated pneumonia) 02/19/2015  . End-stage renal disease on hemodialysis ([place]) 02/19/2015    Past Surgical History:  Procedure Laterality Date  . ABDOMINAL HYSTERECTOMY  1992  . AMPUTATION TOE Left 10/02/2017   Procedure: AMPUTATION TOE-LEFT GREAT TOE;  Surgeon: Albertine Patricia, DPM;  Location: [facility] ORS;  Service: Podiatry;  Laterality: Left;  . APPENDECTOMY    . CARDIAC CATHETERIZATION Left 07/26/2015   Procedure: Left Heart Cath and Coronary Angiography;  Surgeon: Dionisio David, MD;  Location: [place] [facility];  Service: Cardiovascular;  Laterality: Left;  . CATARACT EXTRACTION W/ INTRAOCULAR LENS IMPLANT Right   . CATARACT EXTRACTION W/PHACO Left 03/10/2017   Procedure: CATARACT EXTRACTION PHACO AND INTRAOCULAR LENS PLACEMENT (IOC);  Surgeon: Birder Robson, MD;  Location: [facility] ORS;  Service: Ophthalmology;  Laterality: Left;  [place] 00:51.9 AP% 14.2 CDE 7.39 fluid pack lot # [id number]  . CHOLECYSTECTOMY    . COLONOSCOPY WITH PROPOFOL N/A 08/12/2016   Procedure: COLONOSCOPY WITH PROPOFOL;  Surgeon: Lollie Sails, MD;  Location: [facility] ENDOSCOPY;  Service: Endoscopy;  Laterality: N/A;  . DIALYSIS FISTULA CREATION Left    upper arm  . dialysis grafts    . ESOPHAGOGASTRODUODENOSCOPY N/A 03/08/2015   Procedure: ESOPHAGOGASTRODUODENOSCOPY (EGD);  Surgeon: Manya Silvas, MD;  Location: [facility] ENDOSCOPY;  Service: Endoscopy;  Laterality: N/A;  . ESOPHAGOGASTRODUODENOSCOPY (EGD) WITH PROPOFOL N/A 03/18/2016   Procedure: ESOPHAGOGASTRODUODENOSCOPY (EGD) WITH PROPOFOL;  Surgeon: [name], MD;  Location: [facility] ENDOSCOPY;  Service: Endoscopy;  Laterality: N/A;  . EYE SURGERY  Right 2018  . FECAL TRANSPLANT N/A 08/23/2015   Procedure: FECAL TRANSPLANT;  Surgeon: Manya Silvas, MD;  Location: [facility] ENDOSCOPY;  Service: Endoscopy;  Laterality: N/A;  . HAND SURGERY Bilateral   . PERIPHERAL VASCULAR CATHETERIZATION N/A 12/20/2015   Procedure: Thrombectomy of dialysis access versus permcath placement;  Surgeon: Algernon Huxley, MD;  Location: [place] [facility];  Service: Cardiovascular;  Laterality: N/A;  . PERIPHERAL VASCULAR CATHETERIZATION N/A 12/20/2015   Procedure: A/V Shunt Intervention;  Surgeon: Algernon Huxley, MD;  Location: [place] [facility];  Service: Cardiovascular;  Laterality: N/A;  . PERIPHERAL VASCULAR CATHETERIZATION N/A 12/20/2015   Procedure: A/V Shuntogram/Fistulagram;  Surgeon: Algernon Huxley, MD;  Location: [place] [facility];  Service: Cardiovascular;  Laterality: N/A;  . PERIPHERAL VASCULAR CATHETERIZATION N/A 01/02/2016   Procedure: A/V Shuntogram/Fistulagram;  Surgeon: Algernon Huxley, MD;  Location: [place] [facility];  Service: Cardiovascular;  Laterality: N/A;  . PERIPHERAL VASCULAR CATHETERIZATION N/A 01/02/2016   Procedure: A/V Shunt Intervention;  Surgeon: Algernon Huxley, MD;  Location: [place] [facility];  Service: Cardiovascular;  Laterality: N/A;  Prior to Admission medications   Medication Sig Start Date End Date Taking? Authorizing Provider  acidophilus (RISAQUAD) CAPS capsule Take 1 capsule by mouth daily.    [provider]  albuterol (PROVENTIL HFA;VENTOLIN HFA) 108 (90 Base) MCG/ACT inhaler Inhale 2 puffs into the lungs every 6 (six) hours as needed for wheezing or shortness of breath.     [provider]  albuterol (PROVENTIL) (2.5 MG/3ML) 0.083% nebulizer solution Take 2.5 mg by nebulization every 4 (four) hours as needed for wheezing or shortness of breath.    [provider]  alum & mag hydroxide-simeth (MAALOX MAX) 400-400-40 MG/5ML suspension Take 5 mLs by mouth every 6 (six) hours as needed for  indigestion. Patient not taking: Reported on 10/09/2017 06/09/17   Rudene Re, MD  amLODipine (NORVASC) 10 MG tablet Take 10 mg by mouth daily.    [provider]  aspirin EC 81 MG tablet Take 81 mg by mouth daily. 09/08/16   [provider]  atorvastatin (LIPITOR) 20 MG tablet Take 20 mg by mouth every evening.  12/02/16   [provider]  Biotin 1 MG CAPS Take 1 mg by mouth daily.    [provider]  carvedilol (COREG) 25 MG tablet Take 25 mg by mouth 2 (two) times daily with a meal.    [provider]  cetirizine (ZYRTEC) 10 MG tablet Take 10 mg by mouth daily.     [provider]  cholecalciferol (VITAMIN D) 400 units TABS tablet Take 400 Units by mouth daily.    [provider]  clopidogrel (PLAVIX) 75 MG tablet Take 1 tablet (75 mg total) by mouth daily. 03/25/17 03/25/18  Dustin Flock, MD  clotrimazole (LOTRIMIN) 1 % cream Apply 1 application topically 2 (two) times daily.    [provider]  cyanocobalamin 1000 MCG tablet Take 1,000 mcg by mouth daily.     [provider]  cyclobenzaprine (FLEXERIL) 5 MG tablet Take 1 tablet (5 mg total) by mouth 3 (three) times daily as needed for muscle spasms. Patient not taking: Reported on 10/27/2017 10/15/17   Merlyn Lot, MD  dexlansoprazole (DEXILANT) 60 MG capsule Take 60 mg by mouth daily.    [provider]  diclofenac sodium (VOLTAREN) 1 % GEL Apply 1 application topically daily as needed (for rash).    [provider]  doxycycline (VIBRA-TABS) 100 MG tablet Take 1 tablet (100 mg total) by mouth every 12 (twelve) hours. Patient not taking: Reported on 10/09/2017 09/24/2017   Salary, Holly Bodily D, MD  famotidine (PEPCID) 20 MG tablet Take 1 tablet (20 mg total) by mouth 2 (two) times daily. Patient not taking: Reported on 09/30/2017 06/09/17 06/09/18  Rudene Re, MD  FLUoxetine (PROZAC) 20 MG capsule Take 1 capsule (20 mg total) by mouth  daily. Patient taking differently: Take 20 mg by mouth at bedtime.  04/10/16   Hower, Aaron Mose, MD  glucose 4 GM chewable tablet Chew 3-4 tablets by mouth as needed for low blood sugar (blood sugar less than 60).     [provider]  HYDROcodone-acetaminophen (NORCO/VICODIN) 5-325 MG tablet Take 1 tablet by mouth every 6 (six) hours as needed for moderate pain or severe pain. Patient taking differently: Take 1 tablet by mouth 3 (three) times daily as needed for moderate pain or severe pain.  05/05/17   Gouru, Illene Silver, MD  insulin aspart (NOVOLOG FLEXPEN) 100 UNIT/ML FlexPen Inject 4-15 Units into the skin 4 (four) times daily as needed for high  blood sugar (> 100, per sliding scale).    [provider]  insulin detemir (LEVEMIR) 100 UNIT/ML injection Inject 0.02 mLs (2 Units total) into the skin 2 (two) times daily. Patient taking differently: Inject 6 Units into the skin at bedtime.  09/05/17   Epifanio Lesches, MD  lipase/protease/amylase (CREON) 12000 UNITS CPEP capsule Take 24,000-36,000 Units by mouth See admin instructions. Take 36000 mg in the morning, 24000 midday & 36000 in the evening    [provider]  meclizine (ANTIVERT) 25 MG tablet Take 25 mg by mouth every 6 (six) hours as needed for dizziness.    [provider]  methocarbamol (ROBAXIN) 500 MG tablet Take 1 tablet (500 mg total) by mouth 4 (four) times daily. Patient not taking: Reported on 10/27/2017 10/23/17   Rudene Re, MD  metoCLOPramide (REGLAN) 10 MG tablet Take 1 tablet (10 mg total) by mouth every 8 (eight) hours as needed for nausea or vomiting. Patient not taking: Reported on 10/07/2017 06/08/17   Earleen Newport, MD  multivitamin (RENA-VIT) TABS tablet Take 1 tablet by mouth daily.     [provider]  naloxone [facility]) 0.4 MG/ML injection Inject 0.4 mg into the vein once.     [provider]  nitroGLYCERIN (NITROSTAT) 0.4 MG SL tablet Place 0.4 mg under the  tongue every 5 (five) minutes as needed for chest pain.     [provider]  promethazine (PHENERGAN) 25 MG suppository Place 1 suppository (25 mg total) rectally every 6 (six) hours as needed for nausea. 10/09/17 10/09/18  Harvest Dark, MD  promethazine (PHENERGAN) 25 MG tablet Take 0.5 tablets (12.5 mg total) by mouth every 6 (six) hours as needed for nausea or vomiting. 10/09/17   Harvest Dark, MD  ranitidine (ZANTAC) 300 MG tablet Take 450 mg by mouth at bedtime.     [provider]  rifaximin (XIFAXAN) 550 MG TABS tablet Take 550 mg by mouth 2 (two) times daily.    [provider]  sevelamer carbonate (RENVELA) 800 MG tablet Take 800-2,400 mg by mouth See admin instructions. Take 2400 mg by mouth 3 times daily with meals and take 800 mg by mouth with snacks    [provider]    Allergies Ace inhibitors; Ativan [lorazepam]; Compazine [prochlorperazine edisylate]; Sumatriptan succinate; Dilaudid [hydromorphone hcl]; Ondansetron; Zofran [ondansetron hcl]; Codeine; Gabapentin; [place]; Losartan; Oxycodone; Prochlorperazine; Reglan [metoclopramide]; Scopolamine; Tape; and Tapentadol  Family History  Problem Relation Age of Onset  . Kidney disease Mother   . Diabetes Mother   . Cancer Father   . Kidney disease Sister     Social History Social History   Tobacco Use  . Smoking status: Former Smoker    Packs/day: 0.50    Years: 40.00    Pack years: 20.00    Types: Cigarettes    Last attempt to quit: 02/13/2015    Years since quitting: 2.9  . Smokeless tobacco: Never Used  Substance Use Topics  . Alcohol use: Yes    Alcohol/week: 0.6 oz    Types: 1 Glasses of wine per week    Comment: glass wine week per pt  . Drug use: Yes    Types: Marijuana    Comment: once a day    Review of Systems  Constitutional: No fever. Eyes: No visual changes. ENT: No sore throat. Cardiovascular: Denies chest pain. Respiratory: Denies shortness of  breath. Gastrointestinal: Positive for vomiting.  Genitourinary: Negative for dysuria.  Musculoskeletal: Negative for back pain.  Skin: Negative for rash. Neurological: Negative for headache.   ____________________________________________   PHYSICAL EXAM:  VITAL SIGNS: ED Triage Vitals  Enc Vitals Group     BP 01/17/18 1113 (!) 173/67     Pulse Rate 01/17/18 1113 65     Resp 01/17/18 1113 16     Temp 01/17/18 1113 97.8 F (36.6 C)     Temp Source 01/17/18 1113 Oral     SpO2 01/17/18 1107 100 %     Weight 01/17/18 1110 180 lb (81.6 kg)     Height 01/17/18 1110 5\' 5"  (1.651 m)     Head Circumference --      Peak Flow --      Pain Score --      Pain Loc --      Pain Edu? --      Excl. in [place]? --     Constitutional: Alert and oriented.  Extremely uncomfortable appearing, actively retching. Eyes: Conjunctivae are normal.  No scleral icterus. Head: Atraumatic. Nose: No congestion/rhinnorhea. Mouth/Throat: Mucous membranes are somewhat dry.   Neck: Normal range of motion.  Cardiovascular: Normal rate, regular rhythm. Grossly normal heart sounds.  Good peripheral circulation. Respiratory: Normal respiratory effort.  No retractions. Lungs CTAB. Gastrointestinal: Soft with tenderness to bilateral upper quadrants and epigastric area. No distention.  Genitourinary: No flank tenderness. Musculoskeletal: No lower extremity edema.  Extremities warm and well perfused.  Neurologic:  Normal speech and language. No gross focal neurologic deficits are appreciated.  Skin:  Skin is warm and dry. No rash noted. Psychiatric: Mood and affect are normal. Speech and behavior are normal.  ____________________________________________   LABS (all labs ordered are listed, but only abnormal results are displayed)  Labs Reviewed  COMPREHENSIVE METABOLIC PANEL - Abnormal; Notable for the following components:      Result Value   Sodium 131 (*)    Chloride 91 (*)    Glucose, Bld 227 (*)    BUN  33 (*)    Creatinine, Ser 6.81 (*)    Calcium 8.0 (*)    GFR calc non Af Amer 6 (*)    GFR calc Af Amer 7 (*)    All other components within normal limits  CBC - Abnormal; Notable for the following components:   RBC 3.36 (*)    Hemoglobin 10.1 (*)    HCT 30.3 (*)    RDW 15.1 (*)    All other components within normal limits  BRAIN NATRIURETIC PEPTIDE - Abnormal; Notable for the following components:   B Natriuretic Peptide 402.0 (*)    All other components within normal limits  LIPASE, BLOOD  TROPONIN I  URINALYSIS, COMPLETE (UACMP) WITH MICROSCOPIC   ____________________________________________  EKG  ED ECG REPORT I, Arta Silence, the attending physician, personally viewed and interpreted this ECG.  Date: 01/17/2018 EKG Time: 1114 Rate: 63 Rhythm: normal sinus rhythm QRS Axis: normal Intervals: normal ST/T Wave abnormalities: normal Narrative Interpretation: no evidence of acute ischemia  ____________________________________________  RADIOLOGY  CXR: No focal infiltrate or other acute findings CT abd: No acute findings  ____________________________________________   PROCEDURES  Procedure(s) performed: No  Procedures  Critical Care performed: No ____________________________________________   INITIAL IMPRESSION / ASSESSMENT AND PLAN / ED COURSE  Pertinent labs & imaging results that were available during my care of the patient were reviewed by me and considered in my medical decision making (see chart for details).  57 year old female with PMH as noted above including ESRD on dialysis MWF and  pancreatitis presents with acute vomiting and upper abdominal pain today.  I reviewed the past medical records in epic; the patient has had numerous prior ED visits, most recently in February of this year for abdominal pain and nausea, flank pain earlier in February, and vomiting in January.    On exam, the patient is uncomfortable appearing and actively  retching and spitting up although not vomiting, she has some mild abdominal tenderness although it is difficult to assess due to her acute discomfort and vomiting, and the remainder the exam is as described above.  Vital signs are normal except for hypertension.  Differential primarily includes gastroparesis, pancreatitis, exacerbation of her chronic abdominal pain, or less likely ACS.  There is no evidence to suggest vascular etiology such as dissection, and I have a low suspicion for surgical etiology such as SBO.  The patient has numerous medication allergies and states that Phenergan and Dilaudid are the only medications that work for her.  Due to her acute symptoms I will give both of these, will obtain labs, chest x-ray, and reassess.  If pain persists or there are concerning lab abnormalities consider imaging.   ----------------------------------------- 3:24 PM on 01/17/2018 -----------------------------------------  Lab work-up is within normal limits for patient except for slightly elevated glucose.  Patient was comfortable for about 1 hour, but now has severe nausea and some pain again.  She also has diarrhea, and reports that she has not previously had diarrhea during her gastroparesis episodes.  She is still tender in the lower abdomen.  I will obtain a CT (without contrast given the patient's ESRD) to rule out acute colitis or surgical pathology.  If negative the patient's symptoms continue and she cannot tolerate p.o., she may need admission.  ----------------------------------------- 5:00 PM on 01/17/2018 -----------------------------------------  CT shows no acute findings.  Patient has some improvement in her symptoms after second round of medication but is still not tolerating p.o., so will require admission.  I signed the patient out to the hospitalist Dr. Anselm Jungling.   ____________________________________________   FINAL CLINICAL IMPRESSION(S) / ED DIAGNOSES  Final diagnoses:    Nausea vomiting and diarrhea  Generalized abdominal pain      NEW MEDICATIONS STARTED DURING THIS VISIT:  New Prescriptions   No medications on file     Note:  This document was prepared using Dragon voice recognition software and may include unintentional dictation errors.    Arta Silence, MD 01/17/18 1701

## 2018-01-18 ENCOUNTER — Other Ambulatory Visit (INDEPENDENT_AMBULATORY_CARE_PROVIDER_SITE_OTHER): Payer: Self-pay | Admitting: Vascular Surgery

## 2018-01-18 DIAGNOSIS — E1122 Type 2 diabetes mellitus with diabetic chronic kidney disease: Secondary | ICD-10-CM | POA: Diagnosis present

## 2018-01-18 DIAGNOSIS — Z87891 Personal history of nicotine dependence: Secondary | ICD-10-CM | POA: Diagnosis not present

## 2018-01-18 DIAGNOSIS — I2510 Atherosclerotic heart disease of native coronary artery without angina pectoris: Secondary | ICD-10-CM | POA: Diagnosis present

## 2018-01-18 DIAGNOSIS — Z7902 Long term (current) use of antithrombotics/antiplatelets: Secondary | ICD-10-CM | POA: Diagnosis not present

## 2018-01-18 DIAGNOSIS — Z992 Dependence on renal dialysis: Secondary | ICD-10-CM | POA: Diagnosis not present

## 2018-01-18 DIAGNOSIS — I252 Old myocardial infarction: Secondary | ICD-10-CM | POA: Diagnosis not present

## 2018-01-18 DIAGNOSIS — Z85528 Personal history of other malignant neoplasm of kidney: Secondary | ICD-10-CM | POA: Diagnosis not present

## 2018-01-18 DIAGNOSIS — K219 Gastro-esophageal reflux disease without esophagitis: Secondary | ICD-10-CM | POA: Diagnosis present

## 2018-01-18 DIAGNOSIS — R1084 Generalized abdominal pain: Secondary | ICD-10-CM | POA: Diagnosis present

## 2018-01-18 DIAGNOSIS — J449 Chronic obstructive pulmonary disease, unspecified: Secondary | ICD-10-CM | POA: Diagnosis present

## 2018-01-18 DIAGNOSIS — I5032 Chronic diastolic (congestive) heart failure: Secondary | ICD-10-CM | POA: Diagnosis present

## 2018-01-18 DIAGNOSIS — H9190 Unspecified hearing loss, unspecified ear: Secondary | ICD-10-CM | POA: Diagnosis present

## 2018-01-18 DIAGNOSIS — I132 Hypertensive heart and chronic kidney disease with heart failure and with stage 5 chronic kidney disease, or end stage renal disease: Secondary | ICD-10-CM | POA: Diagnosis present

## 2018-01-18 DIAGNOSIS — N186 End stage renal disease: Secondary | ICD-10-CM | POA: Diagnosis present

## 2018-01-18 DIAGNOSIS — Z794 Long term (current) use of insulin: Secondary | ICD-10-CM | POA: Diagnosis not present

## 2018-01-18 DIAGNOSIS — T82868A Thrombosis of vascular prosthetic devices, implants and grafts, initial encounter: Secondary | ICD-10-CM | POA: Diagnosis not present

## 2018-01-18 DIAGNOSIS — Z7951 Long term (current) use of inhaled steroids: Secondary | ICD-10-CM | POA: Diagnosis not present

## 2018-01-18 DIAGNOSIS — E1143 Type 2 diabetes mellitus with diabetic autonomic (poly)neuropathy: Secondary | ICD-10-CM | POA: Diagnosis present

## 2018-01-18 DIAGNOSIS — K3184 Gastroparesis: Secondary | ICD-10-CM | POA: Diagnosis present

## 2018-01-18 DIAGNOSIS — I340 Nonrheumatic mitral (valve) insufficiency: Secondary | ICD-10-CM | POA: Diagnosis present

## 2018-01-18 DIAGNOSIS — E785 Hyperlipidemia, unspecified: Secondary | ICD-10-CM | POA: Diagnosis present

## 2018-01-18 DIAGNOSIS — Z79899 Other long term (current) drug therapy: Secondary | ICD-10-CM | POA: Diagnosis not present

## 2018-01-18 DIAGNOSIS — N2581 Secondary hyperparathyroidism of renal origin: Secondary | ICD-10-CM | POA: Diagnosis present

## 2018-01-18 DIAGNOSIS — E11649 Type 2 diabetes mellitus with hypoglycemia without coma: Secondary | ICD-10-CM | POA: Diagnosis not present

## 2018-01-18 DIAGNOSIS — E11319 Type 2 diabetes mellitus with unspecified diabetic retinopathy without macular edema: Secondary | ICD-10-CM | POA: Diagnosis present

## 2018-01-18 DIAGNOSIS — Z89412 Acquired absence of left great toe: Secondary | ICD-10-CM | POA: Diagnosis not present

## 2018-01-18 LAB — C DIFFICILE QUICK SCREEN W PCR REFLEX
C Diff antigen: NEGATIVE
C Diff interpretation: NOT DETECTED
C Diff toxin: NEGATIVE

## 2018-01-18 LAB — CBC
HCT: 33.1 % — ABNORMAL LOW (ref 35.0–47.0)
Hemoglobin: 11.1 g/dL — ABNORMAL LOW (ref 12.0–16.0)
MCH: 30.4 pg (ref 26.0–34.0)
MCHC: 33.4 g/dL (ref 32.0–36.0)
MCV: 90.9 fL (ref 80.0–100.0)
Platelets: 132 10*3/uL — ABNORMAL LOW (ref 150–440)
RBC: 3.64 MIL/uL — ABNORMAL LOW (ref 3.80–5.20)
RDW: 15.5 % — ABNORMAL HIGH (ref 11.5–14.5)
WBC: 5.6 10*3/uL (ref 3.6–11.0)

## 2018-01-18 LAB — GASTROINTESTINAL PANEL BY PCR, STOOL (REPLACES STOOL CULTURE)

## 2018-01-18 LAB — GLUCOSE, CAPILLARY
Glucose-Capillary: 45 mg/dL — ABNORMAL LOW (ref 65–99)
Glucose-Capillary: 49 mg/dL — ABNORMAL LOW (ref 65–99)
Glucose-Capillary: 199 mg/dL — ABNORMAL HIGH (ref 65–99)
Glucose-Capillary: 116 mg/dL — ABNORMAL HIGH (ref 65–99)
Glucose-Capillary: 107 mg/dL — ABNORMAL HIGH (ref 65–99)
Glucose-Capillary: 108 mg/dL — ABNORMAL HIGH (ref 65–99)
Glucose-Capillary: 115 mg/dL — ABNORMAL HIGH (ref 65–99)

## 2018-01-18 MED ORDER — FLUOXETINE HCL 20 MG PO CAPS
20.0000 mg | ORAL_CAPSULE | Freq: Every day | ORAL | Status: DC
  Administered 2018-01-18 – 2018-01-20 (×3): 20 mg via ORAL
  Filled 2018-01-18 (×3): qty 1

## 2018-01-18 MED ORDER — FAMOTIDINE IN NACL 20-0.9 MG/50ML-% IV SOLN
20.0000 mg | Freq: Two times a day (BID) | INTRAVENOUS | Status: DC
Start: 2018-01-18 — End: 2018-01-19
  Administered 2018-01-18 – 2018-01-19 (×3): 20 mg via INTRAVENOUS
  Filled 2018-01-18 (×3): qty 50

## 2018-01-18 MED ORDER — DEXTROSE 50 % IV SOLN
25.0000 g | Freq: Once | INTRAVENOUS | Status: AC
  Administered 2018-01-18: 25 g via INTRAVENOUS
  Filled 2018-01-18: qty 50

## 2018-01-18 MED ORDER — HYDROMORPHONE HCL 1 MG/ML IJ SOLN
1.0000 mg | Freq: Three times a day (TID) | INTRAMUSCULAR | Status: DC | PRN
  Administered 2018-01-18 – 2018-01-20 (×6): 1 mg via INTRAVENOUS
  Filled 2018-01-18 (×7): qty 1

## 2018-01-18 MED ORDER — INSULIN DETEMIR 100 UNIT/ML ~~LOC~~ SOLN
3.0000 [IU] | Freq: Every day | SUBCUTANEOUS | Status: DC
  Filled 2018-01-18: qty 0.03

## 2018-01-18 NOTE — Progress Notes (Signed)
[name], [place] 01/18/18  Subjective:   Patient is known to our practice from outpatient dialysis.  She is followed by [name] at [facility].  She dialyzes Monday Wednesday Friday second shift.  Estimated dry weight 80.5 kg She arrived to the [name] via EMS for abdominal pain, dry heaving  Patient denies any shortness of breath She continues to report abdominal pain   Objective:  Vital signs in last 24 hours:  Temp:  [97.5 F (36.4 C)-98.1 F (36.7 C)] 98.1 F (36.7 C) (04/29 0906) Pulse Rate:  [61-78] 70 (04/29 0906) Resp:  [10-18] 18 (04/29 0906) BP: (130-169)/(44-69) 169/60 (04/29 0906) SpO2:  [91 %-100 %] 98 % (04/29 0906)  Weight change:  Filed Weights   01/17/18 1110  Weight: 180 lb (81.6 kg)    Intake/Output:    Intake/Output Summary (Last 24 hours) at 01/18/2018 1216 Last data filed at 01/18/2018 0500 Gross per 24 hour  Intake -  Output 0 ml  Net 0 ml     Physical Exam: General:  Mild to moderate distress from pain, nausea  HEENT  anicteric, moist oral mucous membranes  Neck  supple, no JVD  Pulm/lungs  normal breathing effort, clear to auscultation  CVS/Heart  regular rhythm  Abdomen:   Soft, mild diffuse tenderness  Extremities:  No edema  Neurologic:  Alert, able to answer questions  Skin:  No acute rashes  Access:  Left upper arm AV graft       Basic Metabolic Panel:  Recent Labs  Lab 01/17/18 1304  NA 131*  K 3.9  CL 91*  CO2 28  GLUCOSE 227*  BUN 33*  CREATININE 6.81*  CALCIUM 8.0*     CBC: Recent Labs  Lab 01/17/18 1304 01/18/18 0438  WBC 6.7 5.6  HGB 10.1* 11.1*  HCT 30.3* 33.1*  MCV 90.4 90.9  PLT 176 132*      Lab Results  Component Value Date   HEPBSAG Negative 05/04/2017   HEPBIGM  06/12/2010    NEGATIVE (NOTE) High levels of Hepatitis B Core IgM antibody are detectable during the acute stage of Hepatitis B. This antibody is used to differentiate current from  past HBV infection.       Microbiology:  Recent Results (from the past 240 hour(s))  MRSA PCR Screening     Status: None   Collection Time: 01/17/18  7:13 PM  Result Value Ref Range Status   MRSA by PCR NEGATIVE NEGATIVE Final    Comment:        The GeneXpert MRSA Assay (FDA approved for NASAL specimens only), is one component of a comprehensive MRSA colonization surveillance program. It is not intended to diagnose MRSA infection nor to guide or monitor treatment for MRSA infections. Performed at [facility], [address] [place], [place] [address]     Coagulation Studies: No results for input(s): LABPROT, INR in the last 72 hours.  Urinalysis: No results for input(s): COLORURINE, LABSPEC, PHURINE, GLUCOSEU, HGBUR, BILIRUBINUR, KETONESUR, PROTEINUR, UROBILINOGEN, NITRITE, LEUKOCYTESUR in the last 72 hours.  Invalid input(s): APPERANCEUR    Imaging: Ct Abdomen Pelvis Wo Contrast  Result Date: 01/17/2018 CLINICAL DATA:  Abdominal pain. EXAM: CT ABDOMEN AND PELVIS WITHOUT CONTRAST TECHNIQUE: Multidetector CT imaging of the abdomen and pelvis was performed following the standard protocol without IV contrast. COMPARISON:  10/27/2017 FINDINGS: Lower chest: The lung bases are clear of an acute process. There is some patchy atelectasis but no effusions. The heart  is mildly enlarged. No pericardial effusion. Age advanced aortic calcifications. Small hiatal hernia. Hepatobiliary: No focal hepatic lesions or intrahepatic biliary dilatation. The gallbladder is surgically absent. No significant common bile duct dilatation. Pancreas: No mass, inflammation or ductal dilatation. Spleen: Normal size.  No focal lesions. Adrenals/Urinary Tract: The adrenal glands are unremarkable Both kidneys are quite small and there are extensive vascular calcifications consistent with chronic renal disease. No renal lesions. Stomach/Bowel: The stomach, duodenum, small bowel and colon are grossly  normal without oral contrast. No acute inflammatory changes, mass lesions or obstructive findings. The terminal ileum is normal. Colonic diverticulosis without findings for acute diverticulitis. Vascular/Lymphatic: Advanced vascular calcifications likely related to diabetes. No mesenteric or retroperitoneal mass or adenopathy. Reproductive: The uterus is surgically absent. I believe the right ovary is still present. I do not see the left ovary for certain. Other: No free abdominal/pelvic fluid collections or free air. Musculoskeletal: No significant bony findings. Prominent Schmorl's nodes noted at L2 and L3. Partially fused T11-12. Borderline inguinal lymph nodes bilaterally are stable. IMPRESSION: 1. No acute abdominal/pelvic findings, mass lesions or adenopathy without IV or oral contrast. 2. Status post cholecystectomy.  No biliary dilatation. 3. Bibasilar atelectasis. 4. Atrophied kidneys likely due to diabetic nephropathy and vascular disease. 5. Diverticulosis without findings for acute diverticulitis. Electronically Signed   By: Marijo Sanes M.D.   On: 01/17/2018 16:14   Dg Chest Portable 1 View  Result Date: 01/17/2018 CLINICAL DATA:  Severe epigastric pain and vomiting beginning today. EXAM: PORTABLE CHEST 1 VIEW COMPARISON:  10/23/2017 FINDINGS: The heart size and mediastinal contours are within normal limits. Both lungs are clear. Intravascular stent again seen in the left subclavian and axillary regions. IMPRESSION: No active disease. Electronically Signed   By: Earle Gell M.D.   On: 01/17/2018 12:55     Medications:    . acidophilus  1 capsule Oral Daily  . amLODipine  10 mg Oral Daily  . aspirin EC  81 mg Oral Daily  . atorvastatin  20 mg Oral QPM  . carvedilol  25 mg Oral BID WC  . cholecalciferol  400 Units Oral Daily  . clopidogrel  75 mg Oral Daily  . heparin  5,000 Units Subcutaneous Q8H  . insulin aspart  0-9 Units Subcutaneous TID WC  . lipase/protease/amylase  36,000  Units Oral Q breakfast   And  . lipase/protease/amylase  24,000 Units Oral Q lunch   And  . lipase/protease/amylase  36,000 Units Oral Q supper  . multivitamin  1 tablet Oral Daily  . rifaximin  550 mg Oral BID  . sevelamer carbonate  2,400 mg Oral TID WC   And  . sevelamer carbonate  800 mg Oral With snacks  . cyanocobalamin  1,000 mcg Oral Daily   albuterol, docusate sodium, HYDROcodone-acetaminophen, HYDROmorphone (DILAUDID) injection, meclizine, nitroGLYCERIN, ondansetron (ZOFRAN) IV, promethazine, promethazine  Assessment/ Plan:  57 y.o. female  with diabetes, hypertension, hyperlipidemia, diabetic retinopathy, diabetic neuropathy, diabetic gastroparesis, pancreatic insufficiency, depression, GERD. Renal masses, M-spike   MWF [facility] [place], left arm AVG  1. ESRD - dialysis not done today due to patient being restless and at risk for infiltration of Access - potassium and volume status are acceptable - plan for trial of HD tomorrow  2.  Gastroparesis has led to excessive nausea and vomiting in the past Symptomatic control for now   3. Anemia of chronic kidney disease: hemoglobin 11.1 - Mircera as outpatient.   4. Secondary Hyperparathyroidism   -  HOLD  sevelamer until nausea and vomiting are resolved    LOS: 0 [name] 4/29/201912:16 PM  [place], [place]  Note: This note was prepared with Dragon dictation. Any transcription errors are unintentional

## 2018-01-18 NOTE — Progress Notes (Signed)
Patient ID: [name], female   DOB: 1961/06/05, 57 y.o.   MRN: [id number]  [facility] PROGRESS NOTE  [name] LOV:[id number] DOB: Jul 10, 1961 DOA: 01/17/2018 PCP: Freddy Finner, NP  HPI/Subjective: Patient could not tolerate dialysis secondary to nausea vomiting abdominal pain and diarrhea.  She has had 3 episodes of diarrhea here.  Unable to even take her oral medications.  Pain in the abdomen 8 out of 10 intensity.  Objective: Vitals:   01/18/18 0634 01/18/18 0906  BP: (!) 138/44 (!) 169/60  Pulse: 61 70  Resp: 18 18  Temp: 98.1 F (36.7 C) 98.1 F (36.7 C)  SpO2: 100% 98%    Filed Weights   01/17/18 1110  Weight: 81.6 kg (180 lb)    ROS: Review of Systems  Constitutional: Negative for chills and fever.  Eyes: Negative for blurred vision.  Respiratory: Negative for cough and shortness of breath.   Cardiovascular: Negative for chest pain.  Gastrointestinal: Positive for abdominal pain, diarrhea, nausea and vomiting. Negative for constipation.  Genitourinary: Negative for dysuria.  Musculoskeletal: Negative for joint pain.  Neurological: Negative for dizziness and headaches.   Exam: Physical Exam  Constitutional: She is oriented to person, place, and time.  HENT:  Nose: No mucosal edema.  Mouth/Throat: No oropharyngeal exudate or posterior oropharyngeal edema.  Eyes: Pupils are equal, round, and reactive to light. Conjunctivae, EOM and lids are normal.  Neck: No JVD present. Carotid bruit is not present. No edema present. No thyroid mass and no thyromegaly present.  Cardiovascular: S1 normal and S2 normal. Exam reveals no gallop.  No murmur heard. Pulses:      Dorsalis pedis pulses are 2+ on the right side, and 2+ on the left side.  Respiratory: No respiratory distress. She has decreased breath sounds in the right lower field and the left lower field. She has no wheezes. She has no rhonchi. She has no rales.  GI: Soft. Bowel sounds are normal. There is  generalized tenderness.  Musculoskeletal:       Right ankle: She exhibits swelling.       Left ankle: She exhibits swelling.  Lymphadenopathy:    She has no cervical adenopathy.  Neurological: She is alert and oriented to person, place, and time. No cranial nerve deficit.  Skin: Skin is warm. No rash noted. Nails show no clubbing.  Psychiatric: She has a normal mood and affect.      Data Reviewed: Basic Metabolic Panel: Recent Labs  Lab 01/17/18 1304  NA 131*  K 3.9  CL 91*  CO2 28  GLUCOSE 227*  BUN 33*  CREATININE 6.81*  CALCIUM 8.0*   Liver Function Tests: Recent Labs  Lab 01/17/18 1304  AST 20  ALT 15  ALKPHOS 100  BILITOT 0.8  PROT 7.8  ALBUMIN 3.7   Recent Labs  Lab 01/17/18 1304  LIPASE 32   CBC: Recent Labs  Lab 01/17/18 1304 01/18/18 0438  WBC 6.7 5.6  HGB 10.1* 11.1*  HCT 30.3* 33.1*  MCV 90.4 90.9  PLT 176 132*   Cardiac Enzymes: Recent Labs  Lab 01/17/18 1304  TROPONINI <0.03   BNP (last 3 results) Recent Labs    01/17/18 1304  BNP 402.0*     CBG: Recent Labs  Lab 01/17/18 2204 01/18/18 0750 01/18/18 0819 01/18/18 0904 01/18/18 1142  GLUCAP 169* 45* 49* 199* 116*    Recent Results (from the past 240 hour(s))  MRSA PCR Screening     Status: None  Collection Time: 01/17/18  7:13 PM  Result Value Ref Range Status   MRSA by PCR NEGATIVE NEGATIVE Final    Comment:        The GeneXpert MRSA Assay (FDA approved for NASAL specimens only), is one component of a comprehensive MRSA colonization surveillance program. It is not intended to diagnose MRSA infection nor to guide or monitor treatment for MRSA infections. Performed at [facility], [address], [place], [place] [address]      Studies: Ct Abdomen Pelvis Wo Contrast  Result Date: 01/17/2018 CLINICAL DATA:  Abdominal pain. EXAM: CT ABDOMEN AND PELVIS WITHOUT CONTRAST TECHNIQUE: Multidetector CT imaging of the abdomen and pelvis was performed  following the standard protocol without IV contrast. COMPARISON:  10/27/2017 FINDINGS: Lower chest: The lung bases are clear of an acute process. There is some patchy atelectasis but no effusions. The heart is mildly enlarged. No pericardial effusion. Age advanced aortic calcifications. Small hiatal hernia. Hepatobiliary: No focal hepatic lesions or intrahepatic biliary dilatation. The gallbladder is surgically absent. No significant common bile duct dilatation. Pancreas: No mass, inflammation or ductal dilatation. Spleen: Normal size.  No focal lesions. Adrenals/Urinary Tract: The adrenal glands are unremarkable Both kidneys are quite small and there are extensive vascular calcifications consistent with chronic renal disease. No renal lesions. Stomach/Bowel: The stomach, duodenum, small bowel and colon are grossly normal without oral contrast. No acute inflammatory changes, mass lesions or obstructive findings. The terminal ileum is normal. Colonic diverticulosis without findings for acute diverticulitis. Vascular/Lymphatic: Advanced vascular calcifications likely related to diabetes. No mesenteric or retroperitoneal mass or adenopathy. Reproductive: The uterus is surgically absent. I believe the right ovary is still present. I do not see the left ovary for certain. Other: No free abdominal/pelvic fluid collections or free air. Musculoskeletal: No significant bony findings. Prominent Schmorl's nodes noted at L2 and L3. Partially fused T11-12. Borderline inguinal lymph nodes bilaterally are stable. IMPRESSION: 1. No acute abdominal/pelvic findings, mass lesions or adenopathy without IV or oral contrast. 2. Status post cholecystectomy.  No biliary dilatation. 3. Bibasilar atelectasis. 4. Atrophied kidneys likely due to diabetic nephropathy and vascular disease. 5. Diverticulosis without findings for acute diverticulitis. Electronically Signed   By: Marijo Sanes M.D.   On: 01/17/2018 16:14   Dg Chest Portable 1  View  Result Date: 01/17/2018 CLINICAL DATA:  Severe epigastric pain and vomiting beginning today. EXAM: PORTABLE CHEST 1 VIEW COMPARISON:  10/23/2017 FINDINGS: The heart size and mediastinal contours are within normal limits. Both lungs are clear. Intravascular stent again seen in the left subclavian and axillary regions. IMPRESSION: No active disease. Electronically Signed   By: Earle Gell M.D.   On: 01/17/2018 12:55    Scheduled Meds: . acidophilus  1 capsule Oral Daily  . amLODipine  10 mg Oral Daily  . aspirin EC  81 mg Oral Daily  . atorvastatin  20 mg Oral QPM  . carvedilol  25 mg Oral BID WC  . cholecalciferol  400 Units Oral Daily  . clopidogrel  75 mg Oral Daily  . heparin  5,000 Units Subcutaneous Q8H  . insulin aspart  0-9 Units Subcutaneous TID WC  . lipase/protease/amylase  36,000 Units Oral Q breakfast   And  . lipase/protease/amylase  24,000 Units Oral Q lunch   And  . lipase/protease/amylase  36,000 Units Oral Q supper  . multivitamin  1 tablet Oral Daily  . rifaximin  550 mg Oral BID  . cyanocobalamin  1,000 mcg Oral Daily  Assessment/Plan:  1. Acute diabetic gastroparesis.  Patient also has severe abdominal pain nausea vomiting and diarrhea.  Send off stool studies if further diarrhea.  PRN Phenergan and Dilaudid.  Patient has numerous allergies, so we are limited on what we can give.  Give IV famotidine.  In speaking with nephrology this usually lasts for a few days and then settles down. 2. End-stage renal disease on dialysis.  Unable to tolerate dialysis today 3. Type 2 diabetes mellitus with hypoglycemia.  Sliding scale only and stop Levemir insulin. 4. Essential hypertension continue usual medications if able to tolerate 5. Hyperlipidemia unspecified on Lipitor if able to tolerate 6. Coronary artery disease on aspirin Plavix Coreg and Lipitor if able to tolerate 7. History of pancreatitis.  Lipase normal range.  Continue pancreatic enzymes if able to  tolerate.  Code Status:     Code Status Orders  (From admission, onward)        Start     Ordered   01/17/18 1849  Full code  Continuous     01/17/18 1848    Code Status History    Date Active Date Inactive Code Status Order ID Comments User Context   09/30/2017 1804 10/07/2017 2238 Full Code [id number]  Vaughan Basta, MD Inpatient   09/03/2017 2243 09/05/2017 1901 Full Code [id number]  Lance Coon, MD Inpatient   05/04/2017 1833 05/05/2017 2330 Full Code [id number]  Henreitta Leber, MD Inpatient   03/23/2017 1551 03/25/2017 1739 Full Code [id number]  Gladstone Lighter, MD Inpatient   04/21/2016 1559 04/21/2016 1717 Full Code [id number]  Nicholes Mango, MD Inpatient   04/08/2016 1415 04/11/2016 1947 Full Code [id number]  Theodoro Grist, MD Inpatient   03/15/2016 1322 03/20/2016 0019 Full Code [id number]  Bettey Costa, MD Inpatient   02/16/2016 1409 02/19/2016 2238 Full Code [id number]  Bettey Costa, MD ED   12/17/2015 1139 12/21/2015 2209 Full Code [id number]  Dustin Flock, MD ED   11/12/2015 1602 12/01/2015 1943 Full Code [id number]  Bettey Costa, MD Inpatient   10/07/2015 0937 10/12/2015 1959 Full Code [id number]  Fritzi Mandes, MD ED   10/03/2015 0355 10/05/2015 2230 Full Code [id number]  Lance Coon, MD Inpatient   08/31/2015 1600 09/03/2015 2209 Full Code [id number]  Demetrios Loll, MD ED   07/26/2015 1528 07/26/2015 2136 Full Code [id number]  Dionisio David, MD Inpatient   07/22/2015 1736 07/26/2015 1528 Full Code [id number]  Vaughan Basta, MD Inpatient   06/05/2015 2039 06/07/2015 2003 Full Code [id number]  Bettey Costa, MD Inpatient   05/21/2015 2142 05/24/2015 1626 Full Code [id number]  Henreitta Leber, MD Inpatient   04/24/2015 0855 04/26/2015 2211 Full Code [id number]  Demetrios Loll, MD Inpatient   04/01/2015 1558 04/05/2015 2007 Full Code [id number]  Epifanio Lesches, MD ED   03/06/2015 0805 03/09/2015 2111 Full Code [id number]  Juluis Mire, MD Inpatient   02/19/2015 1614 02/24/2015 1741 Full Code [id number]   Aldean Jewett, MD Inpatient      Disposition Plan: Will have to tolerate normal diet prior to disposition  Consultants:  Nephrology  Time spent: 28 minutes  [place]

## 2018-01-18 NOTE — Care Management Note (Signed)
Case Management Note  Patient Details  Name: [name] MRN: [id number] Date of Birth: 1961-06-26  Subjective/Objective:   Admitted to [facility] under observation status with the diagnosis of nausea/vomitting. Son is [name] [phone]). Last seen Freddy Finner NP 12/01/17.  Established dialysis at [facility] [address].  [place] 09/2017. No skilled facility. Takes care of all basic activities of daily living herself.                   Action/Plan: Elvera Bicker, [employer] representative updated.   Expected Discharge Date:                  Expected Discharge Plan:     In-House Referral:     Discharge planning Services     Post Acute Care Choice:    Choice offered to:     DME Arranged:    DME Agency:     HH Arranged:    HH Agency:     Status of Service:     If discussed at [employer], dates discussed:    Additional Comments:  Shelbie Ammons, RN MSN CCM Care Management [phone] 01/18/2018, 10:59 AM

## 2018-01-18 NOTE — Care Management Obs Status (Signed)
[place] NOTIFICATION   Patient Details  Name: KEYONA EMRICH MRN: [id number] Date of Birth: May 04, 1961   Medicare Observation Status Notification Given:  Yes    Shelbie Ammons, RN 01/18/2018, 11:42 AM

## 2018-01-18 NOTE — Progress Notes (Signed)
Pt unable to begin tx r/t n/v/d, pt's bed changed x1, pt changed x2. Pt continued to vagal down throughout her time in the unit with a HR of 34, 45, and 58. MD made aware, tx cancelled and will try again tomorrow. RN notified as well.    01/18/18 Hand Number  (4A)

## 2018-01-19 ENCOUNTER — Ambulatory Visit: Admission: RE | Admit: 2018-01-19 | Payer: Medicare Other | Source: Ambulatory Visit | Admitting: Vascular Surgery

## 2018-01-19 LAB — GLUCOSE, CAPILLARY
Glucose-Capillary: 62 mg/dL — ABNORMAL LOW (ref 65–99)
Glucose-Capillary: 108 mg/dL — ABNORMAL HIGH (ref 65–99)
Glucose-Capillary: 67 mg/dL (ref 65–99)
Glucose-Capillary: 65 mg/dL (ref 65–99)
Glucose-Capillary: 79 mg/dL (ref 65–99)
Glucose-Capillary: 114 mg/dL — ABNORMAL HIGH (ref 65–99)
Glucose-Capillary: 74 mg/dL (ref 65–99)

## 2018-01-19 LAB — RENAL FUNCTION PANEL
Albumin: 3.6 g/dL (ref 3.5–5.0)
Anion gap: 13 (ref 5–15)
BUN: 46 mg/dL — ABNORMAL HIGH (ref 6–20)
CO2: 26 mmol/L (ref 22–32)
Calcium: 8 mg/dL — ABNORMAL LOW (ref 8.9–10.3)
Chloride: 96 mmol/L — ABNORMAL LOW (ref 101–111)
Creatinine, Ser: 9.83 mg/dL — ABNORMAL HIGH (ref 0.44–1.00)
GFR calc Af Amer: 4 mL/min — ABNORMAL LOW (ref >60)
GFR calc non Af Amer: 4 mL/min — ABNORMAL LOW (ref >60)
Glucose, Bld: 63 mg/dL — ABNORMAL LOW (ref 65–99)
Phosphorus: 6.9 mg/dL — ABNORMAL HIGH (ref 2.5–4.6)
Potassium: 3.8 mmol/L (ref 3.5–5.1)
Sodium: 135 mmol/L (ref 135–145)

## 2018-01-19 LAB — CBC
HCT: 32.8 % — ABNORMAL LOW (ref 35.0–47.0)
Hemoglobin: 10.8 g/dL — ABNORMAL LOW (ref 12.0–16.0)
MCH: 30 pg (ref 26.0–34.0)
MCHC: 33 g/dL (ref 32.0–36.0)
MCV: 91.1 fL (ref 80.0–100.0)
Platelets: 172 10*3/uL (ref 150–440)
RBC: 3.6 MIL/uL — ABNORMAL LOW (ref 3.80–5.20)
RDW: 15.6 % — ABNORMAL HIGH (ref 11.5–14.5)
WBC: 4.4 10*3/uL (ref 3.6–11.0)

## 2018-01-19 MED ORDER — ALUM & MAG HYDROXIDE-SIMETH 200-200-20 MG/5ML PO SUSP
30.0000 mL | Freq: Four times a day (QID) | ORAL | Status: DC | PRN
  Administered 2018-01-19: 30 mL via ORAL
  Filled 2018-01-19: qty 30

## 2018-01-19 MED ORDER — POTASSIUM PHOSPHATE MONOBASIC 500 MG PO TABS
500.0000 mg | ORAL_TABLET | Freq: Three times a day (TID) | ORAL | Status: DC
  Administered 2018-01-20 – 2018-01-21 (×4): 500 mg via ORAL
  Filled 2018-01-19 (×6): qty 1

## 2018-01-19 MED ORDER — DEXTROSE 50 % IV SOLN
25.0000 mL | Freq: Once | INTRAVENOUS | Status: AC
  Administered 2018-01-19: 25 mL via INTRAVENOUS
  Filled 2018-01-19: qty 50

## 2018-01-19 MED ORDER — FAMOTIDINE IN NACL 20-0.9 MG/50ML-% IV SOLN
20.0000 mg | INTRAVENOUS | Status: DC
  Administered 2018-01-20: 20 mg via INTRAVENOUS
  Filled 2018-01-19: qty 50

## 2018-01-19 NOTE — Progress Notes (Signed)
HD Tx ended    01/19/18 2132  Vital Signs  Pulse Rate 60  Pulse Rate Source Monitor  Resp 10  BP (!) 156/56  BP Location Right Arm  BP Method Automatic  Patient Position (if appropriate) Lying  Oxygen Therapy  SpO2 99 %  O2 Device Room Air  Pulse Oximetry Type Continuous  During Hemodialysis Assessment  HD Safety Checks Performed Yes  Dialysis Fluid Bolus Normal Saline  Bolus Amount (mL) 250 mL  Intra-Hemodialysis Comments Tx completed;Tolerated well

## 2018-01-19 NOTE — Progress Notes (Signed)
Post HD Tx    01/19/18 2136  Vital Signs  Temp 98.4 F (36.9 C)  Temp Source Oral  Pulse Rate 60  Pulse Rate Source Monitor  Resp 11  BP (!) 158/51  BP Location Right Arm  BP Method Automatic  Patient Position (if appropriate) Lying  Oxygen Therapy  SpO2 99 %  O2 Device Room Air  Pulse Oximetry Type Continuous  Pain Assessment  Pain Scale 0-10  Dialysis Weight  Type of Weight Post-Dialysis  Post-Hemodialysis Assessment  Rinseback Volume (mL) 250 mL  Dialyzer Clearance Lightly streaked  Duration of HD Treatment -hour(s) 3.5 hour(s)  Hemodialysis Intake (mL) 500 mL  UF Total -Machine (mL) 1301 mL  Net UF (mL) 801 mL  Tolerated HD Treatment Yes  AVG/AVF Arterial Site Held (minutes) 15 minutes  AVG/AVF Venous Site Held (minutes) 5 minutes

## 2018-01-19 NOTE — Progress Notes (Signed)
Post HD Tx Assessment, pt stable no change from initial tx.    01/19/18 2141  Neurological  Level of Consciousness Alert  Orientation Level Oriented X4  Respiratory  Respiratory Pattern Regular;Unlabored  Chest Assessment Chest expansion symmetrical  Bilateral Breath Sounds Diminished  Cardiac  Pulse Regular  Cardiac Rhythm NSR  Vascular  R Radial Pulse +2  L Radial Pulse +2  Integumentary  Integumentary (WDL) X  Skin Color Appropriate for ethnicity  Skin Condition Dry  Skin Integrity Excoriated (scratch marks)  Musculoskeletal  Musculoskeletal (WDL) X  Generalized Weakness Yes  Gastrointestinal  Bowel Sounds Assessment Active  GU Assessment  Genitourinary (WDL) WDL  Psychosocial  Psychosocial (WDL) WDL

## 2018-01-19 NOTE — Progress Notes (Signed)
[facility], [place] 01/19/18  Subjective:   Patient is known to our practice from outpatient dialysis.  She is followed by Dr. Juleen China at [facility].  She dialyzes Monday Wednesday Friday second shift.  Estimated dry weight 80.5 kg  Patient was admitted for nausea, vomiting, diarrhea and dry heaving Today, she feels a little better and ready for hemodialysis.  C. difficile negative    Objective:  Vital signs in last 24 hours:  Temp:  [97.7 F (36.5 C)-98.3 F (36.8 C)] 98.3 F (36.8 C) (04/30 0556) Pulse Rate:  [61-63] 61 (04/30 0556) Resp:  [16-18] 18 (04/30 0556) BP: (154-157)/(55-56) 154/56 (04/30 0556) SpO2:  [99 %-100 %] 99 % (04/30 0556)  Weight change:  Filed Weights   01/17/18 1110  Weight: 180 lb (81.6 kg)    Intake/Output:    Intake/Output Summary (Last 24 hours) at 01/19/2018 1108 Last data filed at 01/19/2018 0838 Gross per 24 hour  Intake 100 ml  Output 0 ml  Net 100 ml     Physical Exam: General:  Mild to moderate distress from pain, nausea  HEENT  anicteric, moist oral mucous membranes  Neck  supple, no JVD  Pulm/lungs  normal breathing effort, clear to auscultation  CVS/Heart  regular rhythm  Abdomen:   Soft,   Extremities:  No edema  Neurologic:  Alert, able to answer questions  Skin:  No acute rashes  Access:  Left upper arm AV graft       Basic Metabolic Panel:  Recent Labs  Lab 01/17/18 1304 01/19/18 0638  NA 131* 135  K 3.9 3.8  CL 91* 96*  CO2 28 26  GLUCOSE 227* 63*  BUN 33* 46*  CREATININE 6.81* 9.83*  CALCIUM 8.0* 8.0*  PHOS  --  6.9*     CBC: Recent Labs  Lab 01/17/18 1304 01/18/18 0438 01/19/18 0638  WBC 6.7 5.6 4.4  HGB 10.1* 11.1* 10.8*  HCT 30.3* 33.1* 32.8*  MCV 90.4 90.9 91.1  PLT 176 132* 172      Lab Results  Component Value Date   HEPBSAG Negative 05/04/2017   HEPBIGM  06/12/2010    NEGATIVE (NOTE) High levels of Hepatitis B Core IgM antibody are  detectable during the acute stage of Hepatitis B. This antibody is used to differentiate current from past HBV infection.       Microbiology:  Recent Results (from the past 240 hour(s))  MRSA PCR Screening     Status: None   Collection Time: 01/17/18  7:13 PM  Result Value Ref Range Status   MRSA by PCR NEGATIVE NEGATIVE Final    Comment:        The GeneXpert MRSA Assay (FDA approved for NASAL specimens only), is one component of a comprehensive MRSA colonization surveillance program. It is not intended to diagnose MRSA infection nor to guide or monitor treatment for MRSA infections. Performed at [facility], [address] [place], [place] [address]   Gastrointestinal Panel by PCR , Stool     Status: None   Collection Time: 01/18/18  8:55 PM  Result Value Ref Range Status   Campylobacter species NOT DETECTED NOT DETECTED Final   Plesimonas shigelloides NOT DETECTED NOT DETECTED Final   Salmonella species NOT DETECTED NOT DETECTED Final   Yersinia enterocolitica NOT DETECTED NOT DETECTED Final   Vibrio species NOT DETECTED NOT DETECTED Final   Vibrio cholerae NOT DETECTED NOT DETECTED Final   Enteroaggregative E coli (EAEC)  NOT DETECTED NOT DETECTED Final   Enteropathogenic E coli (EPEC) NOT DETECTED NOT DETECTED Final   Enterotoxigenic E coli (ETEC) NOT DETECTED NOT DETECTED Final   Shiga like toxin producing E coli (STEC) NOT DETECTED NOT DETECTED Final   Shigella/Enteroinvasive E coli (EIEC) NOT DETECTED NOT DETECTED Final   Cryptosporidium NOT DETECTED NOT DETECTED Final   Cyclospora cayetanensis NOT DETECTED NOT DETECTED Final   Entamoeba histolytica NOT DETECTED NOT DETECTED Final   Giardia lamblia NOT DETECTED NOT DETECTED Final   Adenovirus F40/41 NOT DETECTED NOT DETECTED Final   Astrovirus NOT DETECTED NOT DETECTED Final   Norovirus GI/GII NOT DETECTED NOT DETECTED Final   Rotavirus A NOT DETECTED NOT DETECTED Final   Sapovirus (I, II, IV, and V)  NOT DETECTED NOT DETECTED Final    Comment: Performed at [facility], [address] [place], [place] [address]  C difficile quick scan w PCR reflex     Status: None   Collection Time: 01/18/18  8:55 PM  Result Value Ref Range Status   C Diff antigen NEGATIVE NEGATIVE Final   C Diff toxin NEGATIVE NEGATIVE Final   C Diff interpretation No C. difficile detected.  Final    Comment: Performed at [facility], [address] [place], [place] [address]    Coagulation Studies: No results for input(s): LABPROT, INR in the last 72 hours.  Urinalysis: No results for input(s): COLORURINE, LABSPEC, PHURINE, GLUCOSEU, HGBUR, BILIRUBINUR, KETONESUR, PROTEINUR, UROBILINOGEN, NITRITE, LEUKOCYTESUR in the last 72 hours.  Invalid input(s): APPERANCEUR    Imaging: Ct Abdomen Pelvis Wo Contrast  Result Date: 01/17/2018 CLINICAL DATA:  Abdominal pain. EXAM: CT ABDOMEN AND PELVIS WITHOUT CONTRAST TECHNIQUE: Multidetector CT imaging of the abdomen and pelvis was performed following the standard protocol without IV contrast. COMPARISON:  10/27/2017 FINDINGS: Lower chest: The lung bases are clear of an acute process. There is some patchy atelectasis but no effusions. The heart is mildly enlarged. No pericardial effusion. Age advanced aortic calcifications. Small hiatal hernia. Hepatobiliary: No focal hepatic lesions or intrahepatic biliary dilatation. The gallbladder is surgically absent. No significant common bile duct dilatation. Pancreas: No mass, inflammation or ductal dilatation. Spleen: Normal size.  No focal lesions. Adrenals/Urinary Tract: The adrenal glands are unremarkable Both kidneys are quite small and there are extensive vascular calcifications consistent with chronic renal disease. No renal lesions. Stomach/Bowel: The stomach, duodenum, small bowel and colon are grossly normal without oral contrast. No acute inflammatory changes, mass lesions or obstructive findings. The terminal  ileum is normal. Colonic diverticulosis without findings for acute diverticulitis. Vascular/Lymphatic: Advanced vascular calcifications likely related to diabetes. No mesenteric or retroperitoneal mass or adenopathy. Reproductive: The uterus is surgically absent. I believe the right ovary is still present. I do not see the left ovary for certain. Other: No free abdominal/pelvic fluid collections or free air. Musculoskeletal: No significant bony findings. Prominent Schmorl's nodes noted at L2 and L3. Partially fused T11-12. Borderline inguinal lymph nodes bilaterally are stable. IMPRESSION: 1. No acute abdominal/pelvic findings, mass lesions or adenopathy without IV or oral contrast. 2. Status post cholecystectomy.  No biliary dilatation. 3. Bibasilar atelectasis. 4. Atrophied kidneys likely due to diabetic nephropathy and vascular disease. 5. Diverticulosis without findings for acute diverticulitis. Electronically Signed   By: Marijo Sanes M.D.   On: 01/17/2018 16:14   Dg Chest Portable 1 View  Result Date: 01/17/2018 CLINICAL DATA:  Severe epigastric pain and vomiting beginning today. EXAM: PORTABLE CHEST 1 VIEW COMPARISON:  10/23/2017 FINDINGS:  The heart size and mediastinal contours are within normal limits. Both lungs are clear. Intravascular stent again seen in the left subclavian and axillary regions. IMPRESSION: No active disease. Electronically Signed   By: Earle Gell M.D.   On: 01/17/2018 12:55     Medications:   . famotidine (PEPCID) IV Stopped (01/18/18 2340)   . acidophilus  1 capsule Oral Daily  . amLODipine  10 mg Oral Daily  . aspirin EC  81 mg Oral Daily  . atorvastatin  20 mg Oral QPM  . carvedilol  25 mg Oral BID WC  . cholecalciferol  400 Units Oral Daily  . clopidogrel  75 mg Oral Daily  . FLUoxetine  20 mg Oral QHS  . heparin  5,000 Units Subcutaneous Q8H  . insulin aspart  0-9 Units Subcutaneous TID WC  . lipase/protease/amylase  36,000 Units Oral Q breakfast   And  .  lipase/protease/amylase  24,000 Units Oral Q lunch   And  . lipase/protease/amylase  36,000 Units Oral Q supper  . multivitamin  1 tablet Oral Daily  . rifaximin  550 mg Oral BID  . cyanocobalamin  1,000 mcg Oral Daily   albuterol, docusate sodium, HYDROcodone-acetaminophen, HYDROmorphone (DILAUDID) injection, meclizine, nitroGLYCERIN, ondansetron (ZOFRAN) IV, promethazine, promethazine  Assessment/ Plan:  57 y.o. female  with diabetes, hypertension, hyperlipidemia, diabetic retinopathy, diabetic neuropathy, diabetic gastroparesis, pancreatic insufficiency, depression, GERD. Renal masses, M-spike   MWF [facility] [place], left arm AVG  1. ESRD -Patient agreed to hemodialysis today.  Will place orders. -Minimal volume removal today.  2.  Gastroparesis has led to excessive nausea and vomiting in the past Symptomatic control for now   3. Anemia of chronic kidney disease: hemoglobin 10.8 - Mircera as outpatient.   4. Secondary Hyperparathyroidism   -  HOLD sevelamer until nausea and vomiting are resolved    LOS: [place] 4/30/201911:08 AM  [place], [place]  Note: This note was prepared with Dragon dictation. Any transcription errors are unintentional

## 2018-01-19 NOTE — Progress Notes (Signed)
Pre HD  

## 2018-01-19 NOTE — Progress Notes (Signed)
Post HD, pt has prolonged bleeding from venous cannulation site greater than 25 min.

## 2018-01-19 NOTE — Progress Notes (Signed)
HD initiated via L AVG using 15g needles x2 and lidocaine prep per patient request. No issues with cannulating however whistling is heard to upper venous portion and may still need to be investigated. Communicated to MD. No current complaints. Vitals stable. Continue to monitor.

## 2018-01-19 NOTE — Progress Notes (Signed)
Patient ID: [name], female   DOB: [birth year], 59 y.o.   MRN: [id number]  [employer] PROGRESS NOTE  KAYANNA MCKILLOP JSH:[id number] DOB: 01/15/1961 DOA: 01/17/2018 PCP: Freddy Finner, NP  HPI/Subjective: Patient was feeling better this morning when I saw her.  She did not have any nausea or vomiting since last night.  Still having some abdominal pain.  Stool studies were negative.  Patient thinks her acid reflux was giving her an issue.  Objective: Vitals:   01/19/18 0556 01/19/18 1100  BP: (!) 154/56 (!) 142/58  Pulse: 61 66  Resp: 18 19  Temp: 98.3 F (36.8 C) 98.2 F (36.8 C)  SpO2: 99% 98%    Filed Weights   01/17/18 1110  Weight: 81.6 kg (180 lb)    ROS: Review of Systems  Constitutional: Negative for chills and fever.  Eyes: Negative for blurred vision.  Respiratory: Negative for cough and shortness of breath.   Cardiovascular: Negative for chest pain.  Gastrointestinal: Positive for abdominal pain and diarrhea. Negative for constipation, nausea and vomiting.  Genitourinary: Negative for dysuria.  Musculoskeletal: Negative for joint pain.  Neurological: Negative for dizziness and headaches.   Exam: Physical Exam  Constitutional: She is oriented to person, place, and time.  HENT:  Nose: No mucosal edema.  Mouth/Throat: No oropharyngeal exudate or posterior oropharyngeal edema.  Eyes: Pupils are equal, round, and reactive to light. Conjunctivae, EOM and lids are normal.  Neck: No JVD present. Carotid bruit is not present. No edema present. No thyroid mass and no thyromegaly present.  Cardiovascular: S1 normal and S2 normal. Exam reveals no gallop.  No murmur heard. Pulses:      Dorsalis pedis pulses are 2+ on the right side, and 2+ on the left side.  Respiratory: No respiratory distress. She has decreased breath sounds in the right lower field and the left lower field. She has no wheezes. She has no rhonchi. She has no rales.  GI: Soft. Bowel sounds are normal.  There is no tenderness.  Musculoskeletal:       Right ankle: She exhibits swelling.       Left ankle: She exhibits swelling.  Lymphadenopathy:    She has no cervical adenopathy.  Neurological: She is alert and oriented to person, place, and time. No cranial nerve deficit.  Skin: Skin is warm. No rash noted. Nails show no clubbing.  Psychiatric: She has a normal mood and affect.      Data Reviewed: Basic Metabolic Panel: Recent Labs  Lab 01/17/18 1304 01/19/18 0638  NA 131* 135  K 3.9 3.8  CL 91* 96*  CO2 28 26  GLUCOSE 227* 63*  BUN 33* 46*  CREATININE 6.81* 9.83*  CALCIUM 8.0* 8.0*  PHOS  --  6.9*   Liver Function Tests: Recent Labs  Lab 01/17/18 1304 01/19/18 0638  AST 20  --   ALT 15  --   ALKPHOS 100  --   BILITOT 0.8  --   PROT 7.8  --   ALBUMIN 3.7 3.6   Recent Labs  Lab 01/17/18 1304  LIPASE 32   CBC: Recent Labs  Lab 01/17/18 1304 01/18/18 0438 01/19/18 0638  WBC 6.7 5.6 4.4  HGB 10.1* 11.1* 10.8*  HCT 30.3* 33.1* 32.8*  MCV 90.4 90.9 91.1  PLT 176 132* 172   Cardiac Enzymes: Recent Labs  Lab 01/17/18 1304  TROPONINI <0.03   BNP (last 3 results) Recent Labs    01/17/18 1304  BNP 402.0*  CBG: Recent Labs  Lab 01/18/18 2159 01/19/18 0722 01/19/18 0841 01/19/18 1150 01/19/18 1322  GLUCAP 115* 62* 108* 67 65    Recent Results (from the past 240 hour(s))  MRSA PCR Screening     Status: None   Collection Time: 01/17/18  7:13 PM  Result Value Ref Range Status   MRSA by PCR NEGATIVE NEGATIVE Final    Comment:        The GeneXpert MRSA Assay (FDA approved for NASAL specimens only), is one component of a comprehensive MRSA colonization surveillance program. It is not intended to diagnose MRSA infection nor to guide or monitor treatment for MRSA infections. Performed at [facility], [address] [place], [place] [address]   Gastrointestinal Panel by PCR , Stool     Status: None   Collection Time:  01/18/18  8:55 PM  Result Value Ref Range Status   Campylobacter species NOT DETECTED NOT DETECTED Final   Plesimonas shigelloides NOT DETECTED NOT DETECTED Final   Salmonella species NOT DETECTED NOT DETECTED Final   Yersinia enterocolitica NOT DETECTED NOT DETECTED Final   Vibrio species NOT DETECTED NOT DETECTED Final   Vibrio cholerae NOT DETECTED NOT DETECTED Final   Enteroaggregative E coli (EAEC) NOT DETECTED NOT DETECTED Final   Enteropathogenic E coli (EPEC) NOT DETECTED NOT DETECTED Final   Enterotoxigenic E coli (ETEC) NOT DETECTED NOT DETECTED Final   Shiga like toxin producing E coli (STEC) NOT DETECTED NOT DETECTED Final   Shigella/Enteroinvasive E coli (EIEC) NOT DETECTED NOT DETECTED Final   Cryptosporidium NOT DETECTED NOT DETECTED Final   Cyclospora cayetanensis NOT DETECTED NOT DETECTED Final   Entamoeba histolytica NOT DETECTED NOT DETECTED Final   Giardia lamblia NOT DETECTED NOT DETECTED Final   Adenovirus F40/41 NOT DETECTED NOT DETECTED Final   Astrovirus NOT DETECTED NOT DETECTED Final   Norovirus GI/GII NOT DETECTED NOT DETECTED Final   Rotavirus A NOT DETECTED NOT DETECTED Final   Sapovirus (I, II, IV, and V) NOT DETECTED NOT DETECTED Final    Comment: Performed at [facility], [address] [place], [place] [address]  C difficile quick scan w PCR reflex     Status: None   Collection Time: 01/18/18  8:55 PM  Result Value Ref Range Status   C Diff antigen NEGATIVE NEGATIVE Final   C Diff toxin NEGATIVE NEGATIVE Final   C Diff interpretation No C. difficile detected.  Final    Comment: Performed at [facility], [address] [place], [place] [address]     Studies: Ct Abdomen Pelvis Wo Contrast  Result Date: 01/17/2018 CLINICAL DATA:  Abdominal pain. EXAM: CT ABDOMEN AND PELVIS WITHOUT CONTRAST TECHNIQUE: Multidetector CT imaging of the abdomen and pelvis was performed following the standard protocol without IV contrast. COMPARISON:   10/27/2017 FINDINGS: Lower chest: The lung bases are clear of an acute process. There is some patchy atelectasis but no effusions. The heart is mildly enlarged. No pericardial effusion. Age advanced aortic calcifications. Small hiatal hernia. Hepatobiliary: No focal hepatic lesions or intrahepatic biliary dilatation. The gallbladder is surgically absent. No significant common bile duct dilatation. Pancreas: No mass, inflammation or ductal dilatation. Spleen: Normal size.  No focal lesions. Adrenals/Urinary Tract: The adrenal glands are unremarkable Both kidneys are quite small and there are extensive vascular calcifications consistent with chronic renal disease. No renal lesions. Stomach/Bowel: The stomach, duodenum, small bowel and colon are grossly normal without oral contrast. No acute inflammatory changes, mass lesions or obstructive  findings. The terminal ileum is normal. Colonic diverticulosis without findings for acute diverticulitis. Vascular/Lymphatic: Advanced vascular calcifications likely related to diabetes. No mesenteric or retroperitoneal mass or adenopathy. Reproductive: The uterus is surgically absent. I believe the right ovary is still present. I do not see the left ovary for certain. Other: No free abdominal/pelvic fluid collections or free air. Musculoskeletal: No significant bony findings. Prominent Schmorl's nodes noted at L2 and L3. Partially fused T11-12. Borderline inguinal lymph nodes bilaterally are stable. IMPRESSION: 1. No acute abdominal/pelvic findings, mass lesions or adenopathy without IV or oral contrast. 2. Status post cholecystectomy.  No biliary dilatation. 3. Bibasilar atelectasis. 4. Atrophied kidneys likely due to diabetic nephropathy and vascular disease. 5. Diverticulosis without findings for acute diverticulitis. Electronically Signed   By: Marijo Sanes M.D.   On: 01/17/2018 16:14    Scheduled Meds: . acidophilus  1 capsule Oral Daily  . amLODipine  10 mg Oral Daily   . aspirin EC  81 mg Oral Daily  . atorvastatin  20 mg Oral QPM  . carvedilol  25 mg Oral BID WC  . cholecalciferol  400 Units Oral Daily  . clopidogrel  75 mg Oral Daily  . FLUoxetine  20 mg Oral QHS  . heparin  5,000 Units Subcutaneous Q8H  . insulin aspart  0-9 Units Subcutaneous TID WC  . lipase/protease/amylase  36,000 Units Oral Q breakfast   And  . lipase/protease/amylase  24,000 Units Oral Q lunch   And  . lipase/protease/amylase  36,000 Units Oral Q supper  . multivitamin  1 tablet Oral Daily  . rifaximin  550 mg Oral BID  . cyanocobalamin  1,000 mcg Oral Daily    Assessment/Plan:  1. Acute diabetic gastroparesis.  Patient seems to have improved today.  No nausea or vomiting since last night.  Stool studies were negative.Marland Kitchen  PRN Phenergan and Dilaudid.  Patient has numerous allergies, so we are limited on what we can give.  Give IV famotidine.  Would like her to tolerate a few meals prior to disposition 2. End-stage renal disease on dialysis.   patient unable to be dialyzed yesterday and will have dialysis today. 3. Type 2 diabetes mellitus with hypoglycemia.  Sliding scale only with her poor appetite. 4. Essential hypertension continue usual medications if able to tolerate 5. Hyperlipidemia unspecified on Lipitor if able to tolerate 6. Coronary artery disease on aspirin Plavix Coreg and Lipitor if able to tolerate 7. History of pancreatitis.  Lipase normal range.  Continue pancreatic enzymes if able to tolerate.  Code Status:     Code Status Orders  (From admission, onward)        Start     Ordered   01/17/18 1849  Full code  Continuous     01/17/18 1848    Code Status History    Date Active Date Inactive Code Status Order ID Comments User Context   09/30/2017 1804 10/01/2017 2238 Full Code [id number]  Vaughan Basta, MD Inpatient   09/03/2017 2243 09/05/2017 1901 Full Code [id number]  Lance Coon, MD Inpatient   05/04/2017 1833 05/05/2017 2330 Full Code  [id number]  Henreitta Leber, MD Inpatient   03/23/2017 1551 03/25/2017 1739 Full Code [id number]  Gladstone Lighter, MD Inpatient   04/21/2016 1559 04/21/2016 1717 Full Code [id number]  Nicholes Mango, MD Inpatient   04/08/2016 1415 04/11/2016 1947 Full Code [id number]  Theodoro Grist, MD Inpatient   03/15/2016 1322 03/20/2016 0019 Full Code [id number]  Bettey Costa, MD Inpatient   02/16/2016 1409  02/19/2016 2238 Full Code [id number]  Bettey Costa, MD ED   12/17/2015 1139 12/21/2015 2209 Full Code [id number]  Dustin Flock, MD ED   11/12/2015 1602 12/01/2015 1943 Full Code [id number]  Bettey Costa, MD Inpatient   10/07/2015 0937 10/12/2015 1959 Full Code [id number]  Fritzi Mandes, MD ED   10/03/2015 0355 10/05/2015 2230 Full Code [id number]  Lance Coon, MD Inpatient   08/31/2015 1600 09/03/2015 2209 Full Code [id number]  Demetrios Loll, MD ED   07/26/2015 1528 07/26/2015 2136 Full Code [id number]  Dionisio David, MD Inpatient   07/22/2015 1736 07/26/2015 1528 Full Code [id number]  Vaughan Basta, MD Inpatient   06/05/2015 2039 06/07/2015 2003 Full Code [id number]  Bettey Costa, MD Inpatient   05/21/2015 2142 05/24/2015 1626 Full Code [id number]  Henreitta Leber, MD Inpatient   04/24/2015 0855 04/26/2015 2211 Full Code [id number]  Demetrios Loll, MD Inpatient   04/01/2015 1558 04/05/2015 2007 Full Code [id number]  Epifanio Lesches, MD ED   03/06/2015 0805 03/09/2015 2111 Full Code [id number]  Juluis Mire, MD Inpatient   02/19/2015 1614 02/24/2015 1741 Full Code [id number]  Aldean Jewett, MD Inpatient      Disposition Plan: Will have to tolerate normal diet prior to disposition.  Potential discharge tomorrow  Consultants:  Nephrology  Time spent: 26 minutes  [place]

## 2018-01-20 ENCOUNTER — Encounter
Admission: EM | Disposition: A | Discharge status: Home / Self Care | Payer: Self-pay | Source: Home / Self Care | Attending: Internal Medicine

## 2018-01-20 DIAGNOSIS — N186 End stage renal disease: Secondary | ICD-10-CM

## 2018-01-20 DIAGNOSIS — T82868A Thrombosis of vascular prosthetic devices, implants and grafts, initial encounter: Secondary | ICD-10-CM

## 2018-01-20 HISTORY — PX: A/V SHUNTOGRAM: CATH118297

## 2018-01-20 LAB — GLUCOSE, CAPILLARY
Glucose-Capillary: 94 mg/dL (ref 65–99)
Glucose-Capillary: 102 mg/dL — ABNORMAL HIGH (ref 65–99)
Glucose-Capillary: 184 mg/dL — ABNORMAL HIGH (ref 65–99)

## 2018-01-20 SURGERY — A/V SHUNTOGRAM
Anesthesia: Moderate Sedation | Laterality: Left

## 2018-01-20 MED ORDER — IOPAMIDOL (ISOVUE-300) INJECTION 61%
INTRAVENOUS | Status: DC | PRN
  Administered 2018-01-20: 20 mL via INTRA_ARTERIAL

## 2018-01-20 MED ORDER — EPOETIN ALFA 10000 UNIT/ML IJ SOLN
4000.0000 [IU] | INTRAMUSCULAR | Status: DC
Start: 2018-01-20 — End: 2018-01-20

## 2018-01-20 MED ORDER — MIDAZOLAM HCL 2 MG/2ML IJ SOLN
INTRAMUSCULAR | Status: DC | PRN
  Administered 2018-01-20: 2 mg via INTRAVENOUS

## 2018-01-20 MED ORDER — HYDROCORTISONE 0.5 % EX CREA
TOPICAL_CREAM | Freq: Three times a day (TID) | CUTANEOUS | Status: DC
  Administered 2018-01-21 (×2): via TOPICAL
  Filled 2018-01-20: qty 28.35

## 2018-01-20 MED ORDER — CEFAZOLIN SODIUM-DEXTROSE 1-4 GM/50ML-% IV SOLN
1.0000 g | INTRAVENOUS | Status: DC
  Filled 2018-01-20: qty 50

## 2018-01-20 MED ORDER — HEPARIN (PORCINE) IN NACL 1000-0.9 UT/500ML-% IV SOLN
INTRAVENOUS | Status: AC
  Filled 2018-01-20: qty 1000

## 2018-01-20 MED ORDER — FENTANYL CITRATE (PF) 100 MCG/2ML IJ SOLN
INTRAMUSCULAR | Status: AC
  Filled 2018-01-20: qty 2

## 2018-01-20 MED ORDER — EPOETIN ALFA 4000 UNIT/ML IJ SOLN
4000.0000 [IU] | INTRAMUSCULAR | Status: DC
  Administered 2018-01-20: 4000 [IU] via INTRAVENOUS
  Filled 2018-01-20: qty 1

## 2018-01-20 MED ORDER — FENTANYL CITRATE (PF) 100 MCG/2ML IJ SOLN
INTRAMUSCULAR | Status: DC | PRN
  Administered 2018-01-20: 50 ug via INTRAVENOUS

## 2018-01-20 MED ORDER — LIDOCAINE-EPINEPHRINE (PF) 1 %-1:200000 IJ SOLN
INTRAMUSCULAR | Status: AC
  Filled 2018-01-20: qty 30

## 2018-01-20 MED ORDER — EPOETIN ALFA 10000 UNIT/ML IJ SOLN: 4000.0000 [IU] | INTRAMUSCULAR | Status: DC

## 2018-01-20 MED ORDER — MIDAZOLAM HCL 5 MG/5ML IJ SOLN
INTRAMUSCULAR | Status: AC
  Filled 2018-01-20: qty 5

## 2018-01-20 MED ORDER — HEPARIN SODIUM (PORCINE) 1000 UNIT/ML IJ SOLN
INTRAMUSCULAR | Status: AC
  Filled 2018-01-20: qty 1

## 2018-01-20 MED ORDER — EPOETIN ALFA 10000 UNIT/ML IJ SOLN
4000.0000 [IU] | INTRAMUSCULAR | Status: DC
  Filled 2018-01-20: qty 1

## 2018-01-20 SURGICAL SUPPLY — 6 items
CANNULA 5F STIFF (CANNULA) ×2 IMPLANT
DRAPE BRACHIAL (DRAPES) ×2 IMPLANT
PACK ANGIOGRAPHY (CUSTOM PROCEDURE TRAY) ×2 IMPLANT
SHEATH BRITE TIP 6FRX5.5 (SHEATH) ×2 IMPLANT
SUT MNCRL AB 4-0 PS2 18 (SUTURE) ×2 IMPLANT
TOWEL OR 17X26 4PK STRL BLUE (TOWEL DISPOSABLE) ×2 IMPLANT

## 2018-01-20 NOTE — Progress Notes (Signed)
Patient ID: [name], female   DOB: 11/14/1960, 57 y.o.   MRN: [id number]  [facility] PROGRESS NOTE  [name] FKC:[id number] DOB: 1961/08/12 DOA: 01/17/2018 PCP: Freddy Finner, NP  HPI/Subjective: Patient had some bleeding with her fistula from yesterday night after dialysis.  Brought down for fistulogram and had patent stents.  Patient states that she does not feel well has some abdominal pain and some diarrhea.  She states she did not last night but the nurse stated she ate well last night.  Objective: Vitals:   01/20/18 1022 01/20/18 1030  BP:  (!) 171/58  Pulse: 62 62  Resp: 16 14  Temp:    SpO2: (!) 85% 100%    Filed Weights   01/17/18 1110 01/20/18 0838  Weight: 81.6 kg (180 lb) 81.6 kg (180 lb)    ROS: Review of Systems  Constitutional: Negative for chills and fever.  Eyes: Negative for blurred vision.  Respiratory: Negative for cough and shortness of breath.   Cardiovascular: Negative for chest pain.  Gastrointestinal: Positive for abdominal pain, diarrhea and nausea. Negative for constipation and vomiting.  Genitourinary: Negative for dysuria.  Musculoskeletal: Negative for joint pain.  Neurological: Negative for dizziness and headaches.   Exam: Physical Exam  Constitutional: She is oriented to person, place, and time.  HENT:  Nose: No mucosal edema.  Mouth/Throat: No oropharyngeal exudate or posterior oropharyngeal edema.  Eyes: Pupils are equal, round, and reactive to light. Conjunctivae, EOM and lids are normal.  Neck: No JVD present. Carotid bruit is not present. No edema present. No thyroid mass and no thyromegaly present.  Cardiovascular: S1 normal and S2 normal. Exam reveals no gallop.  No murmur heard. Pulses:      Dorsalis pedis pulses are 2+ on the right side, and 2+ on the left side.  Respiratory: No respiratory distress. She has decreased breath sounds in the right lower field and the left lower field. She has no wheezes. She has no rhonchi.  She has no rales.  GI: Soft. Bowel sounds are normal. There is no tenderness.  Musculoskeletal:       Right ankle: She exhibits swelling.       Left ankle: She exhibits swelling.  Lymphadenopathy:    She has no cervical adenopathy.  Neurological: She is alert and oriented to person, place, and time. No cranial nerve deficit.  Skin: Skin is warm. No rash noted. Nails show no clubbing.  Psychiatric: She has a normal mood and affect.      Data Reviewed: Basic Metabolic Panel: Recent Labs  Lab 01/17/18 1304 01/19/18 0638  NA 131* 135  K 3.9 3.8  CL 91* 96*  CO2 28 26  GLUCOSE 227* 63*  BUN 33* 46*  CREATININE 6.81* 9.83*  CALCIUM 8.0* 8.0*  PHOS  --  6.9*   Liver Function Tests: Recent Labs  Lab 01/17/18 1304 01/19/18 0638  AST 20  --   ALT 15  --   ALKPHOS 100  --   BILITOT 0.8  --   PROT 7.8  --   ALBUMIN 3.7 3.6   Recent Labs  Lab 01/17/18 1304  LIPASE 32   CBC: Recent Labs  Lab 01/17/18 1304 01/18/18 0438 01/19/18 0638  WBC 6.7 5.6 4.4  HGB 10.1* 11.1* 10.8*  HCT 30.3* 33.1* 32.8*  MCV 90.4 90.9 91.1  PLT 176 132* 172   Cardiac Enzymes: Recent Labs  Lab 01/17/18 1304  TROPONINI <0.03   BNP (last 3 results) Recent  Labs    01/17/18 1304  BNP 402.0*     CBG: Recent Labs  Lab 01/19/18 1351 01/19/18 1632 01/19/18 2239 01/20/18 0738 01/20/18 1200  GLUCAP 79 114* 74 94 102*    Recent Results (from the past 240 hour(s))  MRSA PCR Screening     Status: None   Collection Time: 01/17/18  7:13 PM  Result Value Ref Range Status   MRSA by PCR NEGATIVE NEGATIVE Final    Comment:        The GeneXpert MRSA Assay (FDA approved for NASAL specimens only), is one component of a comprehensive MRSA colonization surveillance program. It is not intended to diagnose MRSA infection nor to guide or monitor treatment for MRSA infections. Performed at [facility], [address] [place], [place] [address]   Gastrointestinal Panel by  PCR , Stool     Status: None   Collection Time: 01/18/18  8:55 PM  Result Value Ref Range Status   Campylobacter species NOT DETECTED NOT DETECTED Final   Plesimonas shigelloides NOT DETECTED NOT DETECTED Final   Salmonella species NOT DETECTED NOT DETECTED Final   Yersinia enterocolitica NOT DETECTED NOT DETECTED Final   Vibrio species NOT DETECTED NOT DETECTED Final   Vibrio cholerae NOT DETECTED NOT DETECTED Final   Enteroaggregative E coli (EAEC) NOT DETECTED NOT DETECTED Final   Enteropathogenic E coli (EPEC) NOT DETECTED NOT DETECTED Final   Enterotoxigenic E coli (ETEC) NOT DETECTED NOT DETECTED Final   Shiga like toxin producing E coli (STEC) NOT DETECTED NOT DETECTED Final   Shigella/Enteroinvasive E coli (EIEC) NOT DETECTED NOT DETECTED Final   Cryptosporidium NOT DETECTED NOT DETECTED Final   Cyclospora cayetanensis NOT DETECTED NOT DETECTED Final   Entamoeba histolytica NOT DETECTED NOT DETECTED Final   Giardia lamblia NOT DETECTED NOT DETECTED Final   Adenovirus F40/41 NOT DETECTED NOT DETECTED Final   Astrovirus NOT DETECTED NOT DETECTED Final   Norovirus GI/GII NOT DETECTED NOT DETECTED Final   Rotavirus A NOT DETECTED NOT DETECTED Final   Sapovirus (I, II, IV, and V) NOT DETECTED NOT DETECTED Final    Comment: Performed at [facility], [address] [place], [place] [address]  C difficile quick scan w PCR reflex     Status: None   Collection Time: 01/18/18  8:55 PM  Result Value Ref Range Status   C Diff antigen NEGATIVE NEGATIVE Final   C Diff toxin NEGATIVE NEGATIVE Final   C Diff interpretation No C. difficile detected.  Final    Comment: Performed at [facility], [address] [place], [place] [address]      Scheduled Meds: . acidophilus  1 capsule Oral Daily  . amLODipine  10 mg Oral Daily  . aspirin EC  81 mg Oral Daily  . atorvastatin  20 mg Oral QPM  . carvedilol  25 mg Oral BID WC  . cholecalciferol  400 Units Oral Daily  .  clopidogrel  75 mg Oral Daily  . FLUoxetine  20 mg Oral QHS  . heparin  5,000 Units Subcutaneous Q8H  . insulin aspart  0-9 Units Subcutaneous TID WC  . lipase/protease/amylase  36,000 Units Oral Q breakfast   And  . lipase/protease/amylase  24,000 Units Oral Q lunch   And  . lipase/protease/amylase  36,000 Units Oral Q supper  . multivitamin  1 tablet Oral Daily  . potassium phosphate (monobasic)  500 mg Oral TID WC & HS  . rifaximin  550 mg Oral  BID  . cyanocobalamin  1,000 mcg Oral Daily    Assessment/Plan:  1. Acute diabetic gastroparesis.  Patient ate but then had pain and diarrhea.  Stool studies were negative.Marland Kitchen  PRN Phenergan and Dilaudid.  Patient has numerous allergies, so we are limited on what we can give.  Give IV famotidine.  Would like her to tolerate a few meals prior to disposition. 2. End-stage renal disease on dialysis.  Patient had dialysis yesterday with bleeding afterwards.  Fistulogram was negative.  Patient did have dialysis today and stay on her usual schedule. 3. Type 2 diabetes mellitus with hypoglycemia.  Sliding scale only with her poor appetite. 4. Essential hypertension continue usual medications if able to tolerate 5. Hyperlipidemia unspecified on Lipitor 6. Coronary artery disease on aspirin Plavix Coreg and Lipitor 7. History of pancreatitis.  Lipase normal range.  Continue pancreatic enzymes.  Code Status:     Code Status Orders  (From admission, onward)        Start     Ordered   01/17/18 1849  Full code  Continuous     01/17/18 1848    Code Status History    Date Active Date Inactive Code Status Order ID Comments User Context   09/30/2017 1804 10/15/2017 2238 Full Code [id number]  Vaughan Basta, MD Inpatient   09/03/2017 2243 09/05/2017 1901 Full Code [id number]  Lance Coon, MD Inpatient   05/04/2017 1833 05/05/2017 2330 Full Code [id number]  Henreitta Leber, MD Inpatient   03/23/2017 1551 03/25/2017 1739 Full Code [id number]  Gladstone Lighter, MD Inpatient   04/21/2016 1559 04/21/2016 1717 Full Code [id number]  Nicholes Mango, MD Inpatient   04/08/2016 1415 04/11/2016 1947 Full Code [id number]  Theodoro Grist, MD Inpatient   03/15/2016 1322 03/20/2016 0019 Full Code [id number]  Bettey Costa, MD Inpatient   02/16/2016 1409 02/19/2016 2238 Full Code [id number]  Bettey Costa, MD ED   12/17/2015 1139 12/21/2015 2209 Full Code [id number]  Dustin Flock, MD ED   11/12/2015 1602 12/01/2015 1943 Full Code [id number]  Bettey Costa, MD Inpatient   10/07/2015 0937 10/12/2015 1959 Full Code [id number]  Fritzi Mandes, MD ED   10/03/2015 0355 10/05/2015 2230 Full Code [id number]  Lance Coon, MD Inpatient   08/31/2015 1600 09/03/2015 2209 Full Code [id number]  Demetrios Loll, MD ED   07/26/2015 1528 07/26/2015 2136 Full Code [id number]  Dionisio David, MD Inpatient   07/22/2015 1736 07/26/2015 1528 Full Code [id number]  Vaughan Basta, MD Inpatient   06/05/2015 2039 06/07/2015 2003 Full Code [id number]  Bettey Costa, MD Inpatient   05/21/2015 2142 05/24/2015 1626 Full Code [id number]  Henreitta Leber, MD Inpatient   04/24/2015 0855 04/26/2015 2211 Full Code [id number]  Demetrios Loll, MD Inpatient   04/01/2015 1558 04/05/2015 2007 Full Code [id number]  Epifanio Lesches, MD ED   03/06/2015 0805 03/09/2015 2111 Full Code [id number]  Juluis Mire, MD Inpatient   02/19/2015 1614 02/24/2015 1741 Full Code [id number]  Aldean Jewett, MD Inpatient      Disposition Plan:  reevaluate tomorrow.  Consultants:  Nephrology  Vascular surgery  Time spent: 27 minutes  [place]

## 2018-01-20 NOTE — Care Management (Signed)
Patient admitted for gastroparesis, and bleeding from HD site.  Patient lives at home with family.  PCP Elsworth Soho at [facility].  Elvera Bicker dialysis liaison aware of admission.  PT consult pending.  Previously open with [facility] home health

## 2018-01-20 NOTE — H&P (Signed)
[facility] History & Physical Update  The patient was interviewed and re-examined.  The patient's previous History and Physical has been reviewed and is unchanged.  There is no change in the plan of care. We plan to proceed with the scheduled procedure.  Leotis Pain, MD  01/20/2018, 9:12 AM

## 2018-01-20 NOTE — Progress Notes (Signed)
Pre HD assessment    01/20/18 1656  Neurological  Level of Consciousness Alert  Orientation Level Oriented X4  Respiratory  Respiratory Pattern Regular;Unlabored  Chest Assessment Chest expansion symmetrical  Cardiac  ECG Monitor Yes  Vascular  R Radial Pulse +2  L Radial Pulse +2  Integumentary  Integumentary (WDL) X  Skin Color Appropriate for ethnicity  Musculoskeletal  Musculoskeletal (WDL) X  Generalized Weakness Yes  Assistive Device None  Gastrointestinal  Bowel Sounds Assessment Active  GU Assessment  Genitourinary (WDL) X  Genitourinary Symptoms  (HD)  Psychosocial  Psychosocial (WDL) WDL

## 2018-01-20 NOTE — Progress Notes (Signed)
Post HD assessment    01/20/18 2025  Neurological  Level of Consciousness Alert  Orientation Level Oriented X4  Respiratory  Respiratory Pattern Regular;Unlabored  Chest Assessment Chest expansion symmetrical  Cardiac  ECG Monitor Yes  Vascular  R Radial Pulse +2  L Radial Pulse +2  Integumentary  Integumentary (WDL) X  Skin Color Appropriate for ethnicity  Musculoskeletal  Musculoskeletal (WDL) X  Generalized Weakness Yes  Assistive Device None  GU Assessment  Genitourinary (WDL) X  Genitourinary Symptoms  (HD)  Psychosocial  Psychosocial (WDL) WDL

## 2018-01-20 NOTE — Progress Notes (Signed)
Pre HD assessment    01/20/18 1655  Vital Signs  Temp 98.6 F (37 C)  Temp Source Oral  Pulse Rate 63  Pulse Rate Source Monitor  Resp 19  BP (!) 168/62  BP Location Right Arm  BP Method Automatic  Patient Position (if appropriate) Lying  Oxygen Therapy  SpO2 99 %  O2 Device Room Air  Pain Assessment  Pain Scale 0-10  Pain Score 0  Dialysis Weight  Weight 82.5 kg (181 lb 14.1 oz)  Type of Weight Pre-Dialysis  Time-Out for Hemodialysis  What Procedure? HD  Pt Identifiers(min of two) First/Last Name;MRN/Account#  Correct Site? Yes  Correct Side? Yes  Correct Procedure? Yes  Consents Verified? Yes  Rad Studies Available? N/A  Safety Precautions Reviewed? Yes  Engineer, civil (consulting) Number  (6A)  Station Number 4  UF/Alarm Test Passed  Conductivity: Meter 13.8  Conductivity: Machine  14  pH 7.4  Reverse Osmosis main  Normal Saline Lot Number [id number]  Dialyzer Lot Number [id number]  Disposable Set Lot Number [id number]  Machine Temperature 98.6 F (37 C)  Musician and Audible Yes  Blood Lines Intact and Secured Yes  Pre Treatment Patient Checks  Vascular access used during treatment Graft  Hepatitis B Surface Antigen Results Negative  Date Hepatitis B Surface Antigen Drawn 02/25/17  Hepatitis B Surface Antibody  (>10)  Date Hepatitis B Surface Antibody Drawn 02/25/17  Hemodialysis Consent Verified Yes  Hemodialysis Standing Orders Initiated Yes  ECG (Telemetry) Monitor On Yes  Prime Ordered Normal Saline  Length of  DialysisTreatment -hour(s) 3 Hour(s)  Dialyzer Elisio 17H NR  Dialysate 3K, 2.5 Ca  Dialysis Anticoagulant None  Dialysate Flow Ordered 800  Blood Flow Rate Ordered 400 mL/min  Ultrafiltration Goal 0.5 Liters  Pre Treatment Labs  (hemoglobin)  Dialysis Blood Pressure Support Ordered Normal Saline  Education / Care Plan  Dialysis Education Provided Yes  Documented Education in Care Plan Yes  Fistula / Graft Left Upper arm  No Placement  Date or Time found.   Orientation: Left  Access Location: Upper arm  Site Condition No complications  Fistula / Graft Assessment Present;Thrill;Bruit  Drainage Description None

## 2018-01-20 NOTE — Op Note (Signed)
[facility]    OPERATIVE NOTE   PROCEDURE: 1.  Left brachial artery to axillary vein arteriovenous graft cannulation under ultrasound guidance 2.  Left arm shuntogram   PRE-OPERATIVE DIAGNOSIS: 1. ESRD 2. Malfunctioning left brachial artery to axillary vein arteriovenous graft  POST-OPERATIVE DIAGNOSIS: same as above   SURGEON: Leotis Pain, MD  ANESTHESIA: local with MCS  ESTIMATED BLOOD LOSS: 3 cc  FINDING(S): 1. Multiple previous stents placed in both the AV graft and the venous outflow tract.  These were all widely patent with no significant stenosis.  The arterial anastomosis was widely patent and blood reflux back into a DRIL bypass which seemed to have good flow but the evaluation was limited.  At the end of the most central stent in the left subclavian vein there was about a 20% stenosis which was not flow-limiting.  The remainder of the central venous circulation was widely patent.  SPECIMEN(S):  None  CONTRAST: 20 cc  FLUORO TIME: 0.1 minutes  MODERATE CONSCIOUS SEDATION TIME:  Approximately 15 minutes using 2 mg of Versed and 50 Mcg of Fentanyl  INDICATIONS: [name] is a 57 y.o. female who presents with malfunctioning left brachial artery to axillary vein arteriovenous graft.  She had a significant infiltration and apparently there has been some prolonged bleeding.  The patient is scheduled for left arm shuntogram.  The patient is aware the risks include but are not limited to: bleeding, infection, thrombosis of the cannulated access, and possible anaphylactic reaction to the contrast.  The patient is aware of the risks of the procedure and elects to proceed forward.  DESCRIPTION: After full informed written consent was obtained, the patient was brought back to the angiography suite and placed supine upon the angiography table.  The patient was connected to monitoring equipment. Moderate conscious sedation was administered during a face to face  encounter throughout the procedure with my supervision of the RN administering medicines and monitoring the patient's vital signs, pulse oximetry, telemetry and mental status throughout from the start of the procedure until the patient was taken to the recovery room The left arm was prepped and draped in the standard fashion for a percutaneous access intervention.  Under ultrasound guidance, the left brachial artery to axillary vein arteriovenous graft was cannulated with a micropuncture needle under direct ultrasound guidance and a permanent image was performed.  The microwire was advanced into the graft and the needle was exchanged for the a microsheath.  I then upsized to a 6 Fr Sheath and imaging was performed.  Hand injections were completed to image the access including the central venous system. This demonstrated multiple previous stents placed in both the AV graft and the venous outflow tract.  These were all widely patent with no significant stenosis.  The arterial anastomosis was widely patent and blood reflux back into a DRIL bypass which seemed to have good flow but the evaluation was limited.  At the end of the most central stent in the left subclavian vein there was about a 20% stenosis which was not flow-limiting.  The remainder of the central venous circulation was widely patent. Based on the completion imaging, no further intervention is necessary. A 4-0 Monocryl purse-string suture was sewn around the sheath.  The sheath was removed while tying down the suture.  A sterile bandage was applied to the puncture site.  COMPLICATIONS: None  CONDITION: Stable   Leotis Pain  01/20/2018 9:43 AM    This note was created  with Dragon Medical transcription system. Any errors in dictation are purely unintentional. 

## 2018-01-20 NOTE — Progress Notes (Signed)
[facility], [place] 01/20/18  Subjective:   Patient is known to our practice from outpatient dialysis.  She is followed by Dr. Juleen China at [facility].  She dialyzes Monday Wednesday Friday second shift.  Estimated dry weight 80.5 kg  Patient was admitted for nausea, vomiting, diarrhea and dry heaving Today, she feels bette states she is able to keep food down She underwent angiogram of the access because of excessive bleeding yesterday .  No stenosis was found.    Objective:  Vital signs in last 24 hours:  Temp:  [98.4 F (36.9 C)-99 F (37.2 C)] 98.8 F (37.1 C) (05/01 0838) Pulse Rate:  [54-69] 62 (05/01 1030) Resp:  [9-20] 14 (05/01 1030) BP: (124-190)/(48-71) 171/58 (05/01 1030) SpO2:  [85 %-100 %] 100 % (05/01 1030) Weight:  [180 lb (81.6 kg)] 180 lb (81.6 kg) (05/01 0838)  Weight change:  Filed Weights   01/17/18 1110 01/20/18 0838  Weight: 180 lb (81.6 kg) 180 lb (81.6 kg)    Intake/Output:    Intake/Output Summary (Last 24 hours) at 01/20/2018 1417 Last data filed at 01/20/2018 0620 Gross per 24 hour  Intake 50 ml  Output 801 ml  Net -751 ml     Physical Exam: General:  Mild to moderate distress from pain, nausea  HEENT  anicteric, moist oral mucous membranes  Neck  supple, no JVD  Pulm/lungs  normal breathing effort, clear to auscultation  CVS/Heart  regular rhythm  Abdomen:   Soft,   Extremities:  No edema  Neurologic:  Alert, able to answer questions  Skin:  No acute rashes  Access:  Left upper arm AV graft       Basic Metabolic Panel:  Recent Labs  Lab 01/17/18 1304 01/19/18 0638  NA 131* 135  K 3.9 3.8  CL 91* 96*  CO2 28 26  GLUCOSE 227* 63*  BUN 33* 46*  CREATININE 6.81* 9.83*  CALCIUM 8.0* 8.0*  PHOS  --  6.9*     CBC: Recent Labs  Lab 01/17/18 1304 01/18/18 0438 01/19/18 0638  WBC 6.7 5.6 4.4  HGB 10.1* 11.1* 10.8*  HCT 30.3* 33.1* 32.8*  MCV 90.4 90.9 91.1  PLT 176 132* 172       Lab Results  Component Value Date   HEPBSAG Negative 05/04/2017   HEPBIGM  06/12/2010    NEGATIVE (NOTE) High levels of Hepatitis B Core IgM antibody are detectable during the acute stage of Hepatitis B. This antibody is used to differentiate current from past HBV infection.       Microbiology:  Recent Results (from the past 240 hour(s))  MRSA PCR Screening     Status: None   Collection Time: 01/17/18  7:13 PM  Result Value Ref Range Status   MRSA by PCR NEGATIVE NEGATIVE Final    Comment:        The GeneXpert MRSA Assay (FDA approved for NASAL specimens only), is one component of a comprehensive MRSA colonization surveillance program. It is not intended to diagnose MRSA infection nor to guide or monitor treatment for MRSA infections. Performed at [facility], [address] [place], [place] [address]   Gastrointestinal Panel by PCR , Stool     Status: None   Collection Time: 01/18/18  8:55 PM  Result Value Ref Range Status   Campylobacter species NOT DETECTED NOT DETECTED Final   Plesimonas shigelloides NOT DETECTED NOT DETECTED Final   Salmonella species NOT DETECTED NOT DETECTED Final  Yersinia enterocolitica NOT DETECTED NOT DETECTED Final   Vibrio species NOT DETECTED NOT DETECTED Final   Vibrio cholerae NOT DETECTED NOT DETECTED Final   Enteroaggregative E coli (EAEC) NOT DETECTED NOT DETECTED Final   Enteropathogenic E coli (EPEC) NOT DETECTED NOT DETECTED Final   Enterotoxigenic E coli (ETEC) NOT DETECTED NOT DETECTED Final   Shiga like toxin producing E coli (STEC) NOT DETECTED NOT DETECTED Final   Shigella/Enteroinvasive E coli (EIEC) NOT DETECTED NOT DETECTED Final   Cryptosporidium NOT DETECTED NOT DETECTED Final   Cyclospora cayetanensis NOT DETECTED NOT DETECTED Final   Entamoeba histolytica NOT DETECTED NOT DETECTED Final   Giardia lamblia NOT DETECTED NOT DETECTED Final   Adenovirus F40/41 NOT DETECTED NOT DETECTED Final    Astrovirus NOT DETECTED NOT DETECTED Final   Norovirus GI/GII NOT DETECTED NOT DETECTED Final   Rotavirus A NOT DETECTED NOT DETECTED Final   Sapovirus (I, II, IV, and V) NOT DETECTED NOT DETECTED Final    Comment: Performed at [facility], [address] [place], [place] [address]  C difficile quick scan w PCR reflex     Status: None   Collection Time: 01/18/18  8:55 PM  Result Value Ref Range Status   C Diff antigen NEGATIVE NEGATIVE Final   C Diff toxin NEGATIVE NEGATIVE Final   C Diff interpretation No C. difficile detected.  Final    Comment: Performed at [facility], [address] [place], [place] [address]    Coagulation Studies: No results for input(s): LABPROT, INR in the last 72 hours.  Urinalysis: No results for input(s): COLORURINE, LABSPEC, PHURINE, GLUCOSEU, HGBUR, BILIRUBINUR, KETONESUR, PROTEINUR, UROBILINOGEN, NITRITE, LEUKOCYTESUR in the last 72 hours.  Invalid input(s): APPERANCEUR    Imaging: No results found.   Medications:   . [START ON 01/21/2018]  ceFAZolin (ANCEF) IV    . famotidine (PEPCID) IV 20 mg (01/20/18 1000)   . acidophilus  1 capsule Oral Daily  . amLODipine  10 mg Oral Daily  . aspirin EC  81 mg Oral Daily  . atorvastatin  20 mg Oral QPM  . carvedilol  25 mg Oral BID WC  . cholecalciferol  400 Units Oral Daily  . clopidogrel  75 mg Oral Daily  . FLUoxetine  20 mg Oral QHS  . heparin  5,000 Units Subcutaneous Q8H  . insulin aspart  0-9 Units Subcutaneous TID WC  . lipase/protease/amylase  36,000 Units Oral Q breakfast   And  . lipase/protease/amylase  24,000 Units Oral Q lunch   And  . lipase/protease/amylase  36,000 Units Oral Q supper  . multivitamin  1 tablet Oral Daily  . potassium phosphate (monobasic)  500 mg Oral TID WC & HS  . rifaximin  550 mg Oral BID  . cyanocobalamin  1,000 mcg Oral Daily   albuterol, alum & mag hydroxide-simeth, docusate sodium, HYDROcodone-acetaminophen, HYDROmorphone (DILAUDID)  injection, meclizine, nitroGLYCERIN, ondansetron (ZOFRAN) IV, promethazine, promethazine  Assessment/ Plan:  57 y.o. female  with diabetes, hypertension, hyperlipidemia, diabetic retinopathy, diabetic neuropathy, diabetic gastroparesis, pancreatic insufficiency, depression, GERD. Renal masses, M-spike   MWF [facility] [place], left arm AVG  1. ESRD -Hemodialysis today. -Minimal volume removal today.  Monitor for excessive postdialysis bleeding from access. If it happens again today, may need to consider decreasing the frequency of Plavix administration  2.  Gastroparesis has led to excessive nausea and vomiting in the past Symptomatic control for now   3. Anemia of chronic kidney disease: hemoglobin 10.8 - [facility] as  outpatient.   4. Secondary Hyperparathyroidism   -  HOLD sevelamer until nausea and vomiting are resolved    LOS: 2 [name] 5/1/20192:17 PM  [place], [place]  Note: This note was prepared with Dragon dictation. Any transcription errors are unintentional

## 2018-01-20 NOTE — Progress Notes (Signed)
HD tx start    01/20/18 1714  Vital Signs  Pulse Rate 61  Pulse Rate Source Monitor  Resp 15  BP (!) 182/62  BP Location Right Arm  BP Method Automatic  Patient Position (if appropriate) Lying  Oxygen Therapy  SpO2 99 %  O2 Device Room Air  During Hemodialysis Assessment  Blood Flow Rate (mL/min) 400 mL/min  Arterial Pressure (mmHg) -200 mmHg  Venous Pressure (mmHg) 190 mmHg  Transmembrane Pressure (mmHg) 80 mmHg  Ultrafiltration Rate (mL/min) 670 mL/min  Dialysate Flow Rate (mL/min) 800 ml/min  Conductivity: Machine  14.1  HD Safety Checks Performed Yes  Dialysis Fluid Bolus Normal Saline  Bolus Amount (mL) 250 mL  Intra-Hemodialysis Comments Tx initiated  Fistula / Graft Left Upper arm  No Placement Date or Time found.   Orientation: Left  Access Location: Upper arm  Status Accessed  Needle Size 15

## 2018-01-20 NOTE — Progress Notes (Signed)
[name] MRN: [id number] DOB: 26-Apr-1961   Cancelled Treatment:    Reason Eval/Treat Not Completed: Other (comment). Chart reviewed, however pt currently out of room for procedure to fix HD site. Will re-attempt again, time permitting.   Ellijah Leffel 01/20/2018, 8:30 AM [name], PT, DPT [phone]

## 2018-01-20 NOTE — Evaluation (Signed)
Physical Therapy Evaluation Patient Details Name: [name] MRN: [id number] DOB: [birth year] Today's Date: 01/20/2018   History of Present Illness  Pt admitted for nausea and vomiting with complaint of abdominal pain. HIstory includes ESRD on HD, anxiety, DM, depression, MI, and GERD. Attempted HD yesterday however AV access malfunction, went to OR for shuntogram and now awaiting HD treatment. Pt very drowsy on pain meds at this time however agreeable to work with therapy.  Clinical Impression  Pt is a pleasant 57 year old female who was admitted for N/V and abdominal pain. Reports she just received pain meds for her pain and is very drowsy from the medicine. Reports pain in R knee (chronic). Per patient, she has been getting up and around room to/from bathroom independently without AD. Pt performs bed mobility/transfers with independence and ambulation with supervision without AD. Ambulated back/forth to chair safely. During dynamic balance, able to stand upright with out LOB. Due to lethargy, not safe to ambulate at this time and pt requesting to return back to bed as she is expecting to go to HD soon. Agreeable to ambulate later this evening with RN staff or with therapy staff tomorrow. Pt demonstrates deficits with strength/mobility. Encouraged to continue to be active during hospitalization to prevent atrophy. Would benefit from skilled PT to address above deficits and promote optimal return to PLOF. Pt currently feels she is at baseline level (when not on pain meds) and doesn't want follow up PT. Recommend continued follow up during acute admission to prevent regression. Will continue to progress.      Follow Up Recommendations No PT follow up    Equipment Recommendations  None recommended by PT    Recommendations for Other Services       Precautions / Restrictions Precautions Precautions: Fall Restrictions Weight Bearing Restrictions: No      Mobility  Bed Mobility Overal bed  mobility: Independent             General bed mobility comments: safe technique performed with ease of mobility  Transfers Overall transfer level: Independent Equipment used: None             General transfer comment: safe technique performed with out AD. upright posture noted  Ambulation/Gait Ambulation/Gait assistance: Supervision Ambulation Distance (Feet): 6 Feet Assistive device: None Gait Pattern/deviations: Step-to pattern     General Gait Details: Due to pain meds, pt felt very tired, hardly able to stay awake. Agreed to transfer to/from recliner, however not appropriate for further ambulation at this time. Safe technique demonstrated and if more alert expect further distance.  Stairs            Wheelchair Mobility    Modified Rankin (Stroke Patients Only)       Balance Overall balance assessment: History of Falls;Needs assistance Sitting-balance support: Feet supported Sitting balance-Leahy Scale: Good     Standing balance support: No upper extremity supported Standing balance-Leahy Scale: Good                               Pertinent Vitals/Pain Pain Assessment: Faces Faces Pain Scale: Hurts a little bit Pain Location: R knee Pain Descriptors / Indicators: Aching Pain Intervention(s): Limited activity within patient's tolerance;Premedicated before session    Home Living Family/patient expects to be discharged to:: Private residence Living Arrangements: Other (Comment)(sisters and other supportive family across street) Available Help at Discharge: Family Type of Home: House Home Access: Ramped entrance  Home Layout: One level Home Equipment: Walker - 2 wheels;Cane - single point      Prior Function Level of Independence: Independent with assistive device(s)         Comments: occasionally does not use AD, however majority of time uses [facility] for home distances. Does report that she drives      Hand Dominance         Extremity/Trunk Assessment   Upper Extremity Assessment Upper Extremity Assessment: Overall WFL for tasks assessed    Lower Extremity Assessment Lower Extremity Assessment: Generalized weakness(B LE grossly 4/5)       Communication   Communication: No difficulties  Cognition Arousal/Alertness: Suspect due to medications Behavior During Therapy: WFL for tasks assessed/performed Overall Cognitive Status: Within Functional Limits for tasks assessed                                        General Comments      Exercises     Assessment/Plan    PT Assessment Patient needs continued PT services  PT Problem List Decreased strength;Decreased balance;Decreased mobility       PT Treatment Interventions DME instruction;Gait training;Therapeutic exercise;Balance training    PT Goals (Current goals can be found in the Care Plan section)  Acute Rehab PT Goals Patient Stated Goal: to go home PT Goal Formulation: With patient Time For Goal Achievement: 02/03/18 Potential to Achieve Goals: Good    Frequency Min 2X/week   Barriers to discharge        Co-evaluation               AM-PAC PT "6 Clicks" Daily Activity  Outcome Measure Difficulty turning over in bed (including adjusting bedclothes, sheets and blankets)?: None Difficulty moving from lying on back to sitting on the side of the bed? : None Difficulty sitting down on and standing up from a chair with arms (e.g., wheelchair, bedside commode, etc,.)?: None Help needed moving to and from a bed to chair (including a wheelchair)?: None Help needed walking in hospital room?: A Little Help needed climbing 3-5 steps with a railing? : A Little 6 Click Score: 22    End of Session   Activity Tolerance: Patient tolerated treatment well Patient left: in bed;with bed alarm set Nurse Communication: Mobility status PT Visit Diagnosis: Unsteadiness on feet (R26.81);History of falling (Z91.81);Difficulty  in walking, not elsewhere classified (R26.2)    Time: 7096-2836 PT Time Calculation (min) (ACUTE ONLY): 13 min   Charges:   PT Evaluation $PT Eval Low Complexity: 1 Low     PT G CodesGreggory Stallion, PT, DPT [phone]   [name] 01/20/2018, 3:45 PM

## 2018-01-20 NOTE — Progress Notes (Signed)
Post HD assessment. Pt tolerated tx well, without c/o or complications. Net UF 1510, goal met.    01/20/18 2028  Vital Signs  Temp 98.6 F (37 C)  Temp Source Oral  Pulse Rate 76  Pulse Rate Source Monitor  Resp 11  BP (!) 176/57  BP Location Right Arm  BP Method Automatic  Patient Position (if appropriate) Lying  Oxygen Therapy  SpO2 100 %  O2 Device Room Air  Dialysis Weight  Weight 82.5 kg (181 lb 14.1 oz)  Type of Weight Post-Dialysis  Post-Hemodialysis Assessment  Rinseback Volume (mL) 250 mL  KECN 66.9 V  Dialyzer Clearance Lightly streaked  Duration of HD Treatment -hour(s) 3 hour(s)  Hemodialysis Intake (mL) 500 mL  UF Total -Machine (mL) 2010 mL  Net UF (mL) 1510 mL  Tolerated HD Treatment Yes  AVG/AVF Arterial Site Held (minutes) 10 minutes  AVG/AVF Venous Site Held (minutes) 10 minutes  Education / Care Plan  Dialysis Education Provided Yes  Documented Education in Care Plan Yes  Fistula / Graft Left Upper arm  No Placement Date or Time found.   Orientation: Left  Access Location: Upper arm  Site Condition No complications  Fistula / Graft Assessment Present;Thrill;Bruit  Status Deaccessed  Drainage Description None

## 2018-01-20 NOTE — Progress Notes (Signed)
HD tx end    01/20/18 2022  Vital Signs  Pulse Rate 76  Pulse Rate Source Monitor  Resp 13  BP (!) 184/61  BP Location Right Arm  BP Method Automatic  Patient Position (if appropriate) Lying  Oxygen Therapy  SpO2 100 %  O2 Device Room Air  During Hemodialysis Assessment  Dialysis Fluid Bolus Normal Saline  Bolus Amount (mL) 250 mL  Intra-Hemodialysis Comments Tx completed

## 2018-01-20 NOTE — Progress Notes (Signed)
Patient concern of not get her potassium phosphate(binder). RN notified Dr. Duane Boston and new orders put in at this time.

## 2018-01-20 NOTE — Progress Notes (Signed)
Patient clinically stable post fistulogram per Dr Lucky Cowboy. Vitals stable. Resting without complaints. Report called to Mitzi Davenport on [address] with plan reviewed.

## 2018-01-21 ENCOUNTER — Encounter: Payer: Self-pay | Admitting: Vascular Surgery

## 2018-01-21 LAB — HEMOGLOBIN: Hemoglobin: 10.9 g/dL — ABNORMAL LOW (ref 12.0–16.0)

## 2018-01-21 LAB — GLUCOSE, CAPILLARY: Glucose-Capillary: 231 mg/dL — ABNORMAL HIGH (ref 65–99)

## 2018-01-21 MED ORDER — INSULIN DETEMIR 100 UNIT/ML ~~LOC~~ SOLN: 6.0000 [IU] | Freq: Every day | SUBCUTANEOUS | Status: DC

## 2018-01-21 MED ORDER — FAMOTIDINE 20 MG PO TABS: 20.0000 mg | ORAL_TABLET | Freq: Every day | ORAL | 0 refills | Status: DC

## 2018-01-21 MED ORDER — HYDROCORTISONE 0.5 % EX CREA: TOPICAL_CREAM | Freq: Three times a day (TID) | CUTANEOUS | 0 refills | Status: DC

## 2018-01-21 NOTE — Progress Notes (Signed)
[facility], [place] 01/21/18  Subjective:   Patient is known to our practice from outpatient dialysis.  She is followed by Dr. Juleen China at [facility].  She dialyzes Monday Wednesday Friday second shift.  Estimated dry weight 80.5 kg  Patient feels much better today Asking about going home States that she is able to keep food down   Objective:  Vital signs in last 24 hours:  Temp:  [98.2 F (36.8 C)-98.7 F (37.1 C)] 98.7 F (37.1 C) (05/02 0635) Pulse Rate:  [59-76] 64 (05/02 0635) Resp:  [11-21] 20 (05/02 0635) BP: (155-191)/(49-64) 164/59 (05/02 0635) SpO2:  [97 %-100 %] 100 % (05/02 0635) Weight:  [181 lb 14.1 oz (82.5 kg)] 181 lb 14.1 oz (82.5 kg) (05/01 2028)  Weight change:  Filed Weights   01/20/18 0838 01/20/18 1655 01/20/18 2028  Weight: 180 lb (81.6 kg) 181 lb 14.1 oz (82.5 kg) 181 lb 14.1 oz (82.5 kg)    Intake/Output:    Intake/Output Summary (Last 24 hours) at 01/21/2018 1632 Last data filed at 01/21/2018 1017 Gross per 24 hour  Intake 240 ml  Output 1510 ml  Net -1270 ml     Physical Exam: General:  no acute distress, well-appearing  HEENT  anicteric, moist oral mucous membranes  Neck  supple, no JVD  Pulm/lungs  normal breathing effort, ild basilar crackles  CVS/Heart  regular rhythm  Abdomen:   Soft,   Extremities:  No edema  Neurologic:  Alert, oriented  Skin:  No acute rashes  Access:  Left upper arm AV graft       Basic Metabolic Panel:  Recent Labs  Lab 01/17/18 1304 01/19/18 0638  NA 131* 135  K 3.9 3.8  CL 91* 96*  CO2 28 26  GLUCOSE 227* 63*  BUN 33* 46*  CREATININE 6.81* 9.83*  CALCIUM 8.0* 8.0*  PHOS  --  6.9*     CBC: Recent Labs  Lab 01/17/18 1304 01/18/18 0438 01/19/18 0638 01/21/18 0504  WBC 6.7 5.6 4.4  --   HGB 10.1* 11.1* 10.8* 10.9*  HCT 30.3* 33.1* 32.8*  --   MCV 90.4 90.9 91.1  --   PLT 176 132* 172  --       Lab Results  Component Value Date   HEPBSAG  Negative 05/04/2017   HEPBIGM  06/12/2010    NEGATIVE (NOTE) High levels of Hepatitis B Core IgM antibody are detectable during the acute stage of Hepatitis B. This antibody is used to differentiate current from past HBV infection.       Microbiology:  Recent Results (from the past 240 hour(s))  MRSA PCR Screening     Status: None   Collection Time: 01/17/18  7:13 PM  Result Value Ref Range Status   MRSA by PCR NEGATIVE NEGATIVE Final    Comment:        The GeneXpert MRSA Assay (FDA approved for NASAL specimens only), is one component of a comprehensive MRSA colonization surveillance program. It is not intended to diagnose MRSA infection nor to guide or monitor treatment for MRSA infections. Performed at [facility], [address] [place], [place] [address]   Gastrointestinal Panel by PCR , Stool     Status: None   Collection Time: 01/18/18  8:55 PM  Result Value Ref Range Status   Campylobacter species NOT DETECTED NOT DETECTED Final   Plesimonas shigelloides NOT DETECTED NOT DETECTED Final   Salmonella species NOT DETECTED NOT DETECTED  Final   Yersinia enterocolitica NOT DETECTED NOT DETECTED Final   Vibrio species NOT DETECTED NOT DETECTED Final   Vibrio cholerae NOT DETECTED NOT DETECTED Final   Enteroaggregative E coli (EAEC) NOT DETECTED NOT DETECTED Final   Enteropathogenic E coli (EPEC) NOT DETECTED NOT DETECTED Final   Enterotoxigenic E coli (ETEC) NOT DETECTED NOT DETECTED Final   Shiga like toxin producing E coli (STEC) NOT DETECTED NOT DETECTED Final   Shigella/Enteroinvasive E coli (EIEC) NOT DETECTED NOT DETECTED Final   Cryptosporidium NOT DETECTED NOT DETECTED Final   Cyclospora cayetanensis NOT DETECTED NOT DETECTED Final   Entamoeba histolytica NOT DETECTED NOT DETECTED Final   Giardia lamblia NOT DETECTED NOT DETECTED Final   Adenovirus F40/41 NOT DETECTED NOT DETECTED Final   Astrovirus NOT DETECTED NOT DETECTED Final   Norovirus GI/GII  NOT DETECTED NOT DETECTED Final   Rotavirus A NOT DETECTED NOT DETECTED Final   Sapovirus (I, II, IV, and V) NOT DETECTED NOT DETECTED Final    Comment: Performed at [facility], [address] [place], [place] [address]  C difficile quick scan w PCR reflex     Status: None   Collection Time: 01/18/18  8:55 PM  Result Value Ref Range Status   C Diff antigen NEGATIVE NEGATIVE Final   C Diff toxin NEGATIVE NEGATIVE Final   C Diff interpretation No C. difficile detected.  Final    Comment: Performed at [facility], [address] [place], [place] [address]    Coagulation Studies: No results for input(s): LABPROT, INR in the last 72 hours.  Urinalysis: No results for input(s): COLORURINE, LABSPEC, PHURINE, GLUCOSEU, HGBUR, BILIRUBINUR, KETONESUR, PROTEINUR, UROBILINOGEN, NITRITE, LEUKOCYTESUR in the last 72 hours.  Invalid input(s): APPERANCEUR    Imaging: No results found.   Medications:       Assessment/ Plan:  57 y.o. female  with diabetes, hypertension, hyperlipidemia, diabetic retinopathy, diabetic neuropathy, diabetic gastroparesis, pancreatic insufficiency, depression, GERD. Renal masses, M-spike   MWF [facility] [place], left arm AVG  1. ESRD -Hemodialysis today. - 1500 cc removed with hemodialysis yesterday  2.  Gastroparesis has led to excessive nausea and vomiting in the past Symptomatic control for now   3. Anemia of chronic kidney disease: hemoglobin 10.9 - Mircera as outpatient.   4. Secondary Hyperparathyroidism   -  HOLD sevelamer until nausea and vomiting are resolved    LOS: 3 Samuel Mcpeek 5/2/20194:32 PM  [facility] [place], [place]  Note: This note was prepared with Dragon dictation. Any transcription errors are unintentional

## 2018-01-21 NOTE — Discharge Summary (Signed)
[facility] NAME: [name]    MR#:  [id number]  DATE OF BIRTH:  1961/01/21  DATE OF ADMISSION:  01/17/2018 ADMITTING PHYSICIAN: Vaughan Basta, MD  DATE OF DISCHARGE: 01/21/2018 10:35 AM  PRIMARY CARE PHYSICIAN: Freddy Finner, NP    ADMISSION DIAGNOSIS:  Generalized abdominal pain [R10.84] Nausea vomiting and diarrhea [R11.2, R19.7]  DISCHARGE DIAGNOSIS:  Principal Problem:   Nausea & vomiting Active Problems:   Nausea and vomiting   Gastroparesis due to DM ([place])   SECONDARY DIAGNOSIS:   Past Medical History:  Diagnosis Date  . Anemia   . Anginal pain ([place])   . Anxiety   . Arthritis   . Asthma   . Broken wrist   . Bronchitis   . chronic diastolic CHF 7/86/7672  . COPD (chronic obstructive pulmonary disease) ([place])   . Coronary artery disease    a. cath 2013: stenting to RCA (report not available); b. cath 2014: LM nl, pLAD 40%, mLAD nl, ost LCx 40%, mid LCx nl, pRCA 30% @ site of prior stent, mRCA 50%  . Depression   . Diabetes mellitus without complication ([place])   . Diabetic neuropathy ([place])   . dialysis 2006  . Diverticulosis   . Dizziness   . Dyspnea   . Elevated lipids   . Environmental and seasonal allergies   . ESRD (end stage renal disease) on dialysis ([place])    M-W-F  . GERD (gastroesophageal reflux disease)   . Headache   . History of hiatal hernia   . HOH (hard of hearing)   . Hx of pancreatitis 2015  . Hypertension   . Lower extremity edema   . Mitral regurgitation    a. echo 10/2013: EF 62%, noWMA, mildly dilated LA, mild to mod MR/TR, GR1DD  . Myocardial infarction ([place])   . Orthopnea   . Pneumonia   . Renal cancer ([place])   . Renal insufficiency    Pt is on dialysis on M,W + F.  . Wheezing     HOSPITAL COURSE:    1. Acute diabetic gastroparesis.  Patient came in with nausea vomiting diarrhea and abdominal pain.  Stool studies were negative.Marland Kitchen  PRN Phenergan and Dilaudid.  Patient  has numerous allergies, so we are limited on what we can give.  Give IV famotidine.  Patient tolerated diet prior to disposition 2. End-stage renal disease on dialysis.  Patient had dialysis twice during the course.   Fistulogram was negative.  3. Type 2 diabetes mellitus with hypoglycemia.  Sliding scale during hospital course.  Can go back on her usual meds at home since she is eating 4. Essential hypertension continue usual medications  5. Hyperlipidemia unspecified on Lipitor 6. Coronary artery disease on aspirin Plavix Coreg and Lipitor 7. History of pancreatitis.  Lipase normal range.  Continue pancreatic enzymes.   DISCHARGE CONDITIONS:   satisfactory  CONSULTS OBTAINED:  Treatment Team:  Murlean Iba, MD  DRUG ALLERGIES:   Allergies  Allergen Reactions  . Ace Inhibitors Swelling and Anaphylaxis  . Ativan [Lorazepam] Other (See Comments)    Reaction:Hallucinations and headaches  . Compazine [Prochlorperazine Edisylate] Anaphylaxis, Nausea And Vomiting and Other (See Comments)    Other reaction(s): dystonia from this vs. Reglan, 23 Jul - patient relates that she takes promethazine frequently with no problems  . Sumatriptan Succinate Other (See Comments)    Other reaction(s): delirium and hallucinations per [facility] records  . Dilaudid [Hydromorphone Hcl] Other (See Comments)  Delirium   . Ondansetron Other (See Comments)    hallucinations    . Zofran [Ondansetron Hcl] Other (See Comments)    Reaction:  hallucinations   . Codeine Nausea And Vomiting  . Gabapentin Other (See Comments)    Reaction:  Unknown    . Lac Bovis Nausea And Vomiting  . Losartan Nausea Only  . Oxycodone Anxiety  . Prochlorperazine Other (See Comments)    Reaction:  Unknown . Patient does not remember reaction but she does have vertigo and anxiety along with n and v at times. Could be used to treat any of these   . Reglan [Metoclopramide] Other (See Comments)    Per patient her Dr. Evelina Bucy her  off it   . Scopolamine Other (See Comments)    Dizziness, also has vertigo already  . Tape Rash  . Tapentadol Rash    DISCHARGE MEDICATIONS:   Allergies as of 01/21/2018      Reactions   Ace Inhibitors Swelling, Anaphylaxis   Ativan [lorazepam] Other (See Comments)   Reaction:Hallucinations and headaches   Compazine [prochlorperazine Edisylate] Anaphylaxis, Nausea And Vomiting, Other (See Comments)   Other reaction(s): dystonia from this vs. Reglan, 23 Jul - patient relates that she takes promethazine frequently with no problems   Sumatriptan Succinate Other (See Comments)   Other reaction(s): delirium and hallucinations per [facility] records   Dilaudid [hydromorphone Hcl] Other (See Comments)   Delirium   Ondansetron Other (See Comments)   hallucinations    Zofran [ondansetron Hcl] Other (See Comments)   Reaction:  hallucinations    Codeine Nausea And Vomiting   Gabapentin Other (See Comments)   Reaction:  Unknown    Lac Bovis Nausea And Vomiting   Losartan Nausea Only   Oxycodone Anxiety   Prochlorperazine Other (See Comments)   Reaction:  Unknown . Patient does not remember reaction but she does have vertigo and anxiety along with n and v at times. Could be used to treat any of these   Reglan [metoclopramide] Other (See Comments)   Per patient her Dr. Evelina Bucy her off it    Scopolamine Other (See Comments)   Dizziness, also has vertigo already   Tape Rash   Tapentadol Rash      Medication List    STOP taking these medications   alum & mag hydroxide-simeth 400-400-40 MG/5ML suspension Commonly known as:  MAALOX MAX   cyclobenzaprine 5 MG tablet Commonly known as:  FLEXERIL   doxycycline 100 MG tablet Commonly known as:  VIBRA-TABS   methocarbamol 500 MG tablet Commonly known as:  ROBAXIN   metoCLOPramide 10 MG tablet Commonly known as:  REGLAN   ranitidine 300 MG tablet Commonly known as:  ZANTAC     TAKE these medications   acidophilus Caps capsule Take 1  capsule by mouth daily.   albuterol (2.5 MG/3ML) 0.083% nebulizer solution Commonly known as:  PROVENTIL Take 2.5 mg by nebulization every 4 (four) hours as needed for wheezing or shortness of breath.   albuterol 108 (90 Base) MCG/ACT inhaler Commonly known as:  PROVENTIL HFA;VENTOLIN HFA Inhale 2 puffs into the lungs every 6 (six) hours as needed for wheezing or shortness of breath.   amLODipine 10 MG tablet Commonly known as:  NORVASC Take 10 mg by mouth daily.   aspirin EC 81 MG tablet Take 81 mg by mouth daily.   atorvastatin 20 MG tablet Commonly known as:  LIPITOR Take 20 mg by mouth every evening.   Biotin 1 MG  Caps Take 1 mg by mouth daily.   carvedilol 25 MG tablet Commonly known as:  COREG Take 25 mg by mouth 2 (two) times daily with a meal.   cetirizine 10 MG tablet Commonly known as:  ZYRTEC Take 10 mg by mouth daily.   cholecalciferol 400 units Tabs tablet Commonly known as:  VITAMIN D Take 400 Units by mouth daily.   clopidogrel 75 MG tablet Commonly known as:  PLAVIX Take 1 tablet (75 mg total) by mouth daily.   clotrimazole 1 % cream Commonly known as:  LOTRIMIN Apply 1 application topically 2 (two) times daily.   cyanocobalamin 1000 MCG tablet Take 1,000 mcg by mouth daily.   DEXILANT 60 MG capsule Generic drug:  dexlansoprazole Take 60 mg by mouth daily.   diclofenac sodium 1 % Gel Commonly known as:  VOLTAREN Apply 1 application topically daily as needed (for rash).   famotidine 20 MG tablet Commonly known as:  PEPCID Take 1 tablet (20 mg total) by mouth daily. What changed:  when to take this   FLUoxetine 20 MG capsule Commonly known as:  PROZAC Take 1 capsule (20 mg total) by mouth daily. What changed:  when to take this   glucose 4 GM chewable tablet Chew 3-4 tablets by mouth as needed for low blood sugar (blood sugar less than 60).   HYDROcodone-acetaminophen 5-325 MG tablet Commonly known as:  NORCO/VICODIN Take 1 tablet by  mouth every 6 (six) hours as needed for moderate pain or severe pain. What changed:  when to take this   hydrocortisone cream 0.5 % Apply topically 3 (three) times daily.   insulin detemir 100 UNIT/ML injection Commonly known as:  LEVEMIR Inject 0.06 mLs (6 Units total) into the skin at bedtime.   lipase/protease/amylase 12000 units Cpep capsule Commonly known as:  CREON Take 24,000-36,000 Units by mouth See admin instructions. Take 36000 mg in the morning, 24000 midday & 36000 in the evening   meclizine 25 MG tablet Commonly known as:  ANTIVERT Take 25 mg by mouth every 6 (six) hours as needed for dizziness.   multivitamin Tabs tablet Take 1 tablet by mouth daily.   nitroGLYCERIN 0.4 MG SL tablet Commonly known as:  NITROSTAT Place 0.4 mg under the tongue every 5 (five) minutes as needed for chest pain.   NOVOLOG FLEXPEN 100 UNIT/ML FlexPen Generic drug:  insulin aspart Inject 4-15 Units into the skin 4 (four) times daily as needed for high blood sugar (> 100, per sliding scale).   promethazine 25 MG tablet Commonly known as:  PHENERGAN Take 0.5 tablets (12.5 mg total) by mouth every 6 (six) hours as needed for nausea or vomiting.   promethazine 25 MG suppository Commonly known as:  PHENERGAN Place 1 suppository (25 mg total) rectally every 6 (six) hours as needed for nausea.   rifaximin 550 MG Tabs tablet Commonly known as:  XIFAXAN Take 550 mg by mouth 2 (two) times daily.   sevelamer carbonate 800 MG tablet Commonly known as:  RENVELA Take 800-2,400 mg by mouth See admin instructions. Take 2400 mg by mouth 3 times daily with meals and take 800 mg by mouth with snacks        DISCHARGE INSTRUCTIONS:   F/u pmd 6 days F/u dialysis tomorrow  If you experience worsening of your admission symptoms, develop shortness of breath, life threatening emergency, suicidal or homicidal thoughts you must seek medical attention immediately by calling 911 or calling your MD  immediately  if symptoms  less severe.  You Must read complete instructions/literature along with all the possible adverse reactions/side effects for all the Medicines you take and that have been prescribed to you. Take any new Medicines after you have completely understood and accept all the possible adverse reactions/side effects.   Please note  You were cared for by a hospitalist during your hospital stay. If you have any questions about your discharge medications or the care you received while you were in the hospital after you are discharged, you can call the unit and asked to speak with the hospitalist on call if the hospitalist that took care of you is not available. Once you are discharged, your primary care physician will handle any further medical issues. Please note that NO REFILLS for any discharge medications will be authorized once you are discharged, as it is imperative that you return to your primary care physician (or establish a relationship with a primary care physician if you do not have one) for your aftercare needs so that they can reassess your need for medications and monitor your lab values.    Today   CHIEF COMPLAINT:   Chief Complaint  Patient presents with  . Abdominal Pain    HISTORY OF PRESENT ILLNESS:  [name]  is a 57 y.o. female with a known history of gastroparesis presented with abdominal pain   VITAL SIGNS:  Blood pressure (!) 164/59, pulse 64, temperature 98.7 F (37.1 C), temperature source Oral, resp. rate 20, height 5\' 5"  (1.651 m), weight 82.5 kg (181 lb 14.1 oz), SpO2 100 %.    PHYSICAL EXAMINATION:  GENERAL:  57 y.o.-year-old patient lying in the bed with no acute distress.  EYES: Pupils equal, round, reactive to light and accommodation. No scleral icterus. Extraocular muscles intact.  HEENT: Head atraumatic, normocephalic. Oropharynx and nasopharynx clear.  NECK:  Supple, no jugular venous distention. No thyroid enlargement, no tenderness.   LUNGS: Normal breath sounds bilaterally, no wheezing, rales,rhonchi or crepitation. No use of accessory muscles of respiration.  CARDIOVASCULAR: S1, S2 normal. No murmurs, rubs, or gallops.  ABDOMEN: Soft, non-tender, non-distended. Bowel sounds present. No organomegaly or mass.  EXTREMITIES: No pedal edema, cyanosis, or clubbing.  NEUROLOGIC: Cranial nerves II through XII are intact. Muscle strength 5/5 in all extremities. Sensation intact. Gait not checked.  PSYCHIATRIC: The patient is alert and oriented x 3.  SKIN: No obvious rash, lesion, or ulcer.   DATA REVIEW:   CBC Recent Labs  Lab 01/19/18 0638 01/21/18 0504  WBC 4.4  --   HGB 10.8* 10.9*  HCT 32.8*  --   PLT 172  --     Chemistries  Recent Labs  Lab 01/17/18 1304 01/19/18 0638  NA 131* 135  K 3.9 3.8  CL 91* 96*  CO2 28 26  GLUCOSE 227* 63*  BUN 33* 46*  CREATININE 6.81* 9.83*  CALCIUM 8.0* 8.0*  AST 20  --   ALT 15  --   ALKPHOS 100  --   BILITOT 0.8  --     Cardiac Enzymes Recent Labs  Lab 01/17/18 1304  TROPONINI <0.03    Microbiology Results  Results for orders placed or performed during the hospital encounter of 01/17/18  MRSA PCR Screening     Status: None   Collection Time: 01/17/18  7:13 PM  Result Value Ref Range Status   MRSA by PCR NEGATIVE NEGATIVE Final    Comment:        The GeneXpert MRSA Assay (FDA approved  for NASAL specimens only), is one component of a comprehensive MRSA colonization surveillance program. It is not intended to diagnose MRSA infection nor to guide or monitor treatment for MRSA infections. Performed at [facility], [address] [place], [place] [address]   Gastrointestinal Panel by PCR , Stool     Status: None   Collection Time: 01/18/18  8:55 PM  Result Value Ref Range Status   Campylobacter species NOT DETECTED NOT DETECTED Final   Plesimonas shigelloides NOT DETECTED NOT DETECTED Final   Salmonella species NOT DETECTED NOT DETECTED  Final   Yersinia enterocolitica NOT DETECTED NOT DETECTED Final   Vibrio species NOT DETECTED NOT DETECTED Final   Vibrio cholerae NOT DETECTED NOT DETECTED Final   Enteroaggregative E coli (EAEC) NOT DETECTED NOT DETECTED Final   Enteropathogenic E coli (EPEC) NOT DETECTED NOT DETECTED Final   Enterotoxigenic E coli (ETEC) NOT DETECTED NOT DETECTED Final   Shiga like toxin producing E coli (STEC) NOT DETECTED NOT DETECTED Final   Shigella/Enteroinvasive E coli (EIEC) NOT DETECTED NOT DETECTED Final   Cryptosporidium NOT DETECTED NOT DETECTED Final   Cyclospora cayetanensis NOT DETECTED NOT DETECTED Final   Entamoeba histolytica NOT DETECTED NOT DETECTED Final   Giardia lamblia NOT DETECTED NOT DETECTED Final   Adenovirus F40/41 NOT DETECTED NOT DETECTED Final   Astrovirus NOT DETECTED NOT DETECTED Final   Norovirus GI/GII NOT DETECTED NOT DETECTED Final   Rotavirus A NOT DETECTED NOT DETECTED Final   Sapovirus (I, II, IV, and V) NOT DETECTED NOT DETECTED Final    Comment: Performed at [facility], [address] [place], [place] [address]  C difficile quick scan w PCR reflex     Status: None   Collection Time: 01/18/18  8:55 PM  Result Value Ref Range Status   C Diff antigen NEGATIVE NEGATIVE Final   C Diff toxin NEGATIVE NEGATIVE Final   C Diff interpretation No C. difficile detected.  Final    Comment: Performed at [facility], [address], [place], [place] [address]      Management plans discussed with the patient, ans she is in agreement.  CODE STATUS:  Code Status History    Date Active Date Inactive Code Status Order ID Comments User Context   01/17/2018 1848 01/21/2018 1341 Full Code [id number]  Vaughan Basta, MD Inpatient   09/30/2017 1804 09/26/2017 2238 Full Code [id number]  Vaughan Basta, MD Inpatient   09/03/2017 2243 09/05/2017 1901 Full Code [id number]  Lance Coon, MD Inpatient   05/04/2017 1833 05/05/2017 2330 Full Code  [id number]  Henreitta Leber, MD Inpatient   03/23/2017 1551 03/25/2017 1739 Full Code [id number]  Gladstone Lighter, MD Inpatient   04/21/2016 1559 04/21/2016 1717 Full Code [id number]  Nicholes Mango, MD Inpatient   04/08/2016 1415 04/11/2016 1947 Full Code [id number]  Theodoro Grist, MD Inpatient   03/15/2016 1322 03/20/2016 0019 Full Code [id number]  Bettey Costa, MD Inpatient   02/16/2016 1409 02/19/2016 2238 Full Code [id number]  Bettey Costa, MD ED   12/17/2015 1139 12/21/2015 2209 Full Code [id number]  Dustin Flock, MD ED   11/12/2015 1602 12/01/2015 1943 Full Code [id number]  Bettey Costa, MD Inpatient   10/07/2015 0937 10/12/2015 1959 Full Code [id number]  Fritzi Mandes, MD ED   10/03/2015 0355 10/05/2015 2230 Full Code [id number]  Lance Coon, MD Inpatient   08/31/2015 1600 09/03/2015 2209 Full Code [id number]  Demetrios Loll, MD ED   07/26/2015 1528 07/26/2015 2136 Full Code [id number]  Humphrey Rolls,  Emmit Pomfret, MD Inpatient   07/22/2015 1736 07/26/2015 1528 Full Code [id number]  Vaughan Basta, MD Inpatient   06/05/2015 2039 06/07/2015 2003 Full Code [id number]  Bettey Costa, MD Inpatient   05/21/2015 2142 05/24/2015 1626 Full Code [id number]  Henreitta Leber, MD Inpatient   04/24/2015 0855 04/26/2015 2211 Full Code [id number]  Demetrios Loll, MD Inpatient   04/01/2015 1558 04/05/2015 2007 Full Code [id number]  Epifanio Lesches, MD ED   03/06/2015 0805 03/09/2015 2111 Full Code [id number]  Juluis Mire, MD Inpatient   02/19/2015 1614 02/24/2015 1741 Full Code [id number]  Aldean Jewett, MD Inpatient      TOTAL TIME TAKING CARE OF THIS PATIENT: 35 minutes.    Loletha Grayer M.D on 01/21/2018 at 4:53 PM  Between 7am to 6pm - Pager - [phone]  After 6pm go to [email or web address] - password [email or web address] [facility]  [phone]  CC: Primary care physician; Freddy Finner, NP

## 2018-01-21 NOTE — Care Management (Signed)
Amanda Morris dialysis liaison notified of discharge  

## 2018-01-21 NOTE — Progress Notes (Signed)
01/21/2018 [name] to be D/C'd Home per MD order.  Discussed prescriptions and follow up appointments with the patient. Prescriptions given to patient, medication list explained in detail. Pt verbalized understanding.  Allergies as of 01/21/2018      Reactions   Ace Inhibitors Swelling, Anaphylaxis   Ativan [lorazepam] Other (See Comments)   Reaction:Hallucinations and headaches   Compazine [prochlorperazine Edisylate] Anaphylaxis, Nausea And Vomiting, Other (See Comments)   Other reaction(s): dystonia from this vs. Reglan, 23 Jul - patient relates that she takes promethazine frequently with no problems   Sumatriptan Succinate Other (See Comments)   Other reaction(s): delirium and hallucinations per [facility] records   Dilaudid [hydromorphone Hcl] Other (See Comments)   Delirium   Ondansetron Other (See Comments)   hallucinations    Zofran [ondansetron Hcl] Other (See Comments)   Reaction:  hallucinations    Codeine Nausea And Vomiting   Gabapentin Other (See Comments)   Reaction:  Unknown    Lac Bovis Nausea And Vomiting   Losartan Nausea Only   Oxycodone Anxiety   Prochlorperazine Other (See Comments)   Reaction:  Unknown . Patient does not remember reaction but she does have vertigo and anxiety along with n and v at times. Could be used to treat any of these   Reglan [metoclopramide] Other (See Comments)   Per patient her Dr. Evelina Bucy her off it    Scopolamine Other (See Comments)   Dizziness, also has vertigo already   Tape Rash   Tapentadol Rash      Medication List    STOP taking these medications   alum & mag hydroxide-simeth 400-400-40 MG/5ML suspension Commonly known as:  MAALOX MAX   cyclobenzaprine 5 MG tablet Commonly known as:  FLEXERIL   doxycycline 100 MG tablet Commonly known as:  VIBRA-TABS   methocarbamol 500 MG tablet Commonly known as:  ROBAXIN   metoCLOPramide 10 MG tablet Commonly known as:  REGLAN   ranitidine 300 MG tablet Commonly known  as:  ZANTAC     TAKE these medications   acidophilus Caps capsule Take 1 capsule by mouth daily.   albuterol (2.5 MG/3ML) 0.083% nebulizer solution Commonly known as:  PROVENTIL Take 2.5 mg by nebulization every 4 (four) hours as needed for wheezing or shortness of breath.   albuterol 108 (90 Base) MCG/ACT inhaler Commonly known as:  PROVENTIL HFA;VENTOLIN HFA Inhale 2 puffs into the lungs every 6 (six) hours as needed for wheezing or shortness of breath.   amLODipine 10 MG tablet Commonly known as:  NORVASC Take 10 mg by mouth daily.   aspirin EC 81 MG tablet Take 81 mg by mouth daily.   atorvastatin 20 MG tablet Commonly known as:  LIPITOR Take 20 mg by mouth every evening.   Biotin 1 MG Caps Take 1 mg by mouth daily.   carvedilol 25 MG tablet Commonly known as:  COREG Take 25 mg by mouth 2 (two) times daily with a meal.   cetirizine 10 MG tablet Commonly known as:  ZYRTEC Take 10 mg by mouth daily.   cholecalciferol 400 units Tabs tablet Commonly known as:  VITAMIN D Take 400 Units by mouth daily.   clopidogrel 75 MG tablet Commonly known as:  PLAVIX Take 1 tablet (75 mg total) by mouth daily.   clotrimazole 1 % cream Commonly known as:  LOTRIMIN Apply 1 application topically 2 (two) times daily.   cyanocobalamin 1000 MCG tablet Take 1,000 mcg by mouth daily.  DEXILANT 60 MG capsule Generic drug:  dexlansoprazole Take 60 mg by mouth daily.   diclofenac sodium 1 % Gel Commonly known as:  VOLTAREN Apply 1 application topically daily as needed (for rash).   famotidine 20 MG tablet Commonly known as:  PEPCID Take 1 tablet (20 mg total) by mouth daily. What changed:  when to take this   FLUoxetine 20 MG capsule Commonly known as:  PROZAC Take 1 capsule (20 mg total) by mouth daily. What changed:  when to take this   glucose 4 GM chewable tablet Chew 3-4 tablets by mouth as needed for low blood sugar (blood sugar less than 60).    HYDROcodone-acetaminophen 5-325 MG tablet Commonly known as:  NORCO/VICODIN Take 1 tablet by mouth every 6 (six) hours as needed for moderate pain or severe pain. What changed:  when to take this   hydrocortisone cream 0.5 % Apply topically 3 (three) times daily.   insulin detemir 100 UNIT/ML injection Commonly known as:  LEVEMIR Inject 0.06 mLs (6 Units total) into the skin at bedtime.   lipase/protease/amylase 12000 units Cpep capsule Commonly known as:  CREON Take 24,000-36,000 Units by mouth See admin instructions. Take 36000 mg in the morning, 24000 midday & 36000 in the evening   meclizine 25 MG tablet Commonly known as:  ANTIVERT Take 25 mg by mouth every 6 (six) hours as needed for dizziness.   multivitamin Tabs tablet Take 1 tablet by mouth daily.   nitroGLYCERIN 0.4 MG SL tablet Commonly known as:  NITROSTAT Place 0.4 mg under the tongue every 5 (five) minutes as needed for chest pain.   NOVOLOG FLEXPEN 100 UNIT/ML FlexPen Generic drug:  insulin aspart Inject 4-15 Units into the skin 4 (four) times daily as needed for high blood sugar (> 100, per sliding scale).   promethazine 25 MG tablet Commonly known as:  PHENERGAN Take 0.5 tablets (12.5 mg total) by mouth every 6 (six) hours as needed for nausea or vomiting.   promethazine 25 MG suppository Commonly known as:  PHENERGAN Place 1 suppository (25 mg total) rectally every 6 (six) hours as needed for nausea.   rifaximin 550 MG Tabs tablet Commonly known as:  XIFAXAN Take 550 mg by mouth 2 (two) times daily.   sevelamer carbonate 800 MG tablet Commonly known as:  RENVELA Take 800-2,400 mg by mouth See admin instructions. Take 2400 mg by mouth 3 times daily with meals and take 800 mg by mouth with snacks       Vitals:   01/20/18 2149 01/21/18 0635  BP: (!) 168/50 (!) 164/59  Pulse: 71 64  Resp: 18 20  Temp: 98.2 F (36.8 C) 98.7 F (37.1 C)  SpO2: 100% 100%    Skin clean, dry and intact without  evidence of skin break down, no evidence of skin tears noted. IV catheter discontinued intact. Site without signs and symptoms of complications. Dressing and pressure applied. Pt denies pain at this time. No complaints noted.  An After Visit Summary was printed and given to the patient. Patient escorted via [place], and D/C home via private auto.  Dola Argyle

## 2018-01-21 NOTE — Progress Notes (Signed)
01/21/2018  10:32 AM  [name] to be D/C'd Home per MD order.  Discussed prescriptions and follow up appointments with the patient. Prescriptions given to patient, medication list explained in detail. Pt verbalized understanding.  Allergies as of 01/21/2018      Reactions   Ace Inhibitors Swelling, Anaphylaxis   Ativan [lorazepam] Other (See Comments)   Reaction:Hallucinations and headaches   Compazine [prochlorperazine Edisylate] Anaphylaxis, Nausea And Vomiting, Other (See Comments)   Other reaction(s): dystonia from this vs. Reglan, 23 Jul - patient relates that she takes promethazine frequently with no problems   Sumatriptan Succinate Other (See Comments)   Other reaction(s): delirium and hallucinations per [facility] records   Dilaudid [hydromorphone Hcl] Other (See Comments)   Delirium   Ondansetron Other (See Comments)   hallucinations    Zofran [ondansetron Hcl] Other (See Comments)   Reaction:  hallucinations    Codeine Nausea And Vomiting   Gabapentin Other (See Comments)   Reaction:  Unknown    Lac Bovis Nausea And Vomiting   Losartan Nausea Only   Oxycodone Anxiety   Prochlorperazine Other (See Comments)   Reaction:  Unknown . Patient does not remember reaction but she does have vertigo and anxiety along with n and v at times. Could be used to treat any of these   Reglan [metoclopramide] Other (See Comments)   Per patient her Dr. Evelina Bucy her off it    Scopolamine Other (See Comments)   Dizziness, also has vertigo already   Tape Rash   Tapentadol Rash      Medication List    STOP taking these medications   alum & mag hydroxide-simeth 400-400-40 MG/5ML suspension Commonly known as:  MAALOX MAX   cyclobenzaprine 5 MG tablet Commonly known as:  FLEXERIL   doxycycline 100 MG tablet Commonly known as:  VIBRA-TABS   methocarbamol 500 MG tablet Commonly known as:  ROBAXIN   metoCLOPramide 10 MG tablet Commonly known as:  REGLAN   ranitidine 300 MG tablet Commonly  known as:  ZANTAC     TAKE these medications   acidophilus Caps capsule Take 1 capsule by mouth daily.   albuterol (2.5 MG/3ML) 0.083% nebulizer solution Commonly known as:  PROVENTIL Take 2.5 mg by nebulization every 4 (four) hours as needed for wheezing or shortness of breath.   albuterol 108 (90 Base) MCG/ACT inhaler Commonly known as:  PROVENTIL HFA;VENTOLIN HFA Inhale 2 puffs into the lungs every 6 (six) hours as needed for wheezing or shortness of breath.   amLODipine 10 MG tablet Commonly known as:  NORVASC Take 10 mg by mouth daily.   aspirin EC 81 MG tablet Take 81 mg by mouth daily.   atorvastatin 20 MG tablet Commonly known as:  LIPITOR Take 20 mg by mouth every evening.   Biotin 1 MG Caps Take 1 mg by mouth daily.   carvedilol 25 MG tablet Commonly known as:  COREG Take 25 mg by mouth 2 (two) times daily with a meal.   cetirizine 10 MG tablet Commonly known as:  ZYRTEC Take 10 mg by mouth daily.   cholecalciferol 400 units Tabs tablet Commonly known as:  VITAMIN D Take 400 Units by mouth daily.   clopidogrel 75 MG tablet Commonly known as:  PLAVIX Take 1 tablet (75 mg total) by mouth daily.   clotrimazole 1 % cream Commonly known as:  LOTRIMIN Apply 1 application topically 2 (two) times daily.   cyanocobalamin 1000 MCG tablet Take 1,000 mcg by mouth daily.  DEXILANT 60 MG capsule Generic drug:  dexlansoprazole Take 60 mg by mouth daily.   diclofenac sodium 1 % Gel Commonly known as:  VOLTAREN Apply 1 application topically daily as needed (for rash).   famotidine 20 MG tablet Commonly known as:  PEPCID Take 1 tablet (20 mg total) by mouth daily. What changed:  when to take this   FLUoxetine 20 MG capsule Commonly known as:  PROZAC Take 1 capsule (20 mg total) by mouth daily. What changed:  when to take this   glucose 4 GM chewable tablet Chew 3-4 tablets by mouth as needed for low blood sugar (blood sugar less than 60).    HYDROcodone-acetaminophen 5-325 MG tablet Commonly known as:  NORCO/VICODIN Take 1 tablet by mouth every 6 (six) hours as needed for moderate pain or severe pain. What changed:  when to take this   hydrocortisone cream 0.5 % Apply topically 3 (three) times daily.   insulin detemir 100 UNIT/ML injection Commonly known as:  LEVEMIR Inject 0.06 mLs (6 Units total) into the skin at bedtime.   lipase/protease/amylase 12000 units Cpep capsule Commonly known as:  CREON Take 24,000-36,000 Units by mouth See admin instructions. Take 36000 mg in the morning, 24000 midday & 36000 in the evening   meclizine 25 MG tablet Commonly known as:  ANTIVERT Take 25 mg by mouth every 6 (six) hours as needed for dizziness.   multivitamin Tabs tablet Take 1 tablet by mouth daily.   nitroGLYCERIN 0.4 MG SL tablet Commonly known as:  NITROSTAT Place 0.4 mg under the tongue every 5 (five) minutes as needed for chest pain.   NOVOLOG FLEXPEN 100 UNIT/ML FlexPen Generic drug:  insulin aspart Inject 4-15 Units into the skin 4 (four) times daily as needed for high blood sugar (> 100, per sliding scale).   promethazine 25 MG tablet Commonly known as:  PHENERGAN Take 0.5 tablets (12.5 mg total) by mouth every 6 (six) hours as needed for nausea or vomiting.   promethazine 25 MG suppository Commonly known as:  PHENERGAN Place 1 suppository (25 mg total) rectally every 6 (six) hours as needed for nausea.   rifaximin 550 MG Tabs tablet Commonly known as:  XIFAXAN Take 550 mg by mouth 2 (two) times daily.   sevelamer carbonate 800 MG tablet Commonly known as:  RENVELA Take 800-2,400 mg by mouth See admin instructions. Take 2400 mg by mouth 3 times daily with meals and take 800 mg by mouth with snacks       Vitals:   01/20/18 2149 01/21/18 0635  BP: (!) 168/50 (!) 164/59  Pulse: 71 64  Resp: 18 20  Temp: 98.2 F (36.8 C) 98.7 F (37.1 C)  SpO2: 100% 100%    Skin clean, dry and intact without  evidence of skin break down, no evidence of skin tears noted. IV catheter discontinued intact. Site without signs and symptoms of complications. Dressing and pressure applied. Pt denies pain at this time. No complaints noted.  An After Visit Summary was printed and given to the patient. Patient escorted via [place], and D/C home via private auto.  Dola Argyle

## 2018-01-25 ENCOUNTER — Telehealth: Payer: Self-pay

## 2018-01-25 NOTE — Telephone Encounter (Signed)
Received EMMI related call on 01/23/18, however called and left message on [phone] [name]'s phone) to state the call hung up on her and that she was requesting a callback.  Called and spoke with patient 01/25/18.  Informed her of the new EMMI call process and that the report showed her call was completed with no flags.  Explained that she would receive another automated call in the next few days as a final check up.  Patient expressed understanding and expressed appreciation for the return call.

## 2018-02-02 ENCOUNTER — Ambulatory Visit: Payer: Self-pay | Admitting: Urology

## 2018-02-04 ENCOUNTER — Telehealth (INDEPENDENT_AMBULATORY_CARE_PROVIDER_SITE_OTHER): Payer: Self-pay | Admitting: Vascular Surgery

## 2018-02-04 IMAGING — CR DG TOE GREAT 2+V*L*
3 series · 3 of 3 positions shown · non-contrast
Comparison: None.

CLINICAL DATA: Diabetic ulcer of left great toe.

EXAM:
LEFT GREAT 0RX-J VIEW

[toe ap]
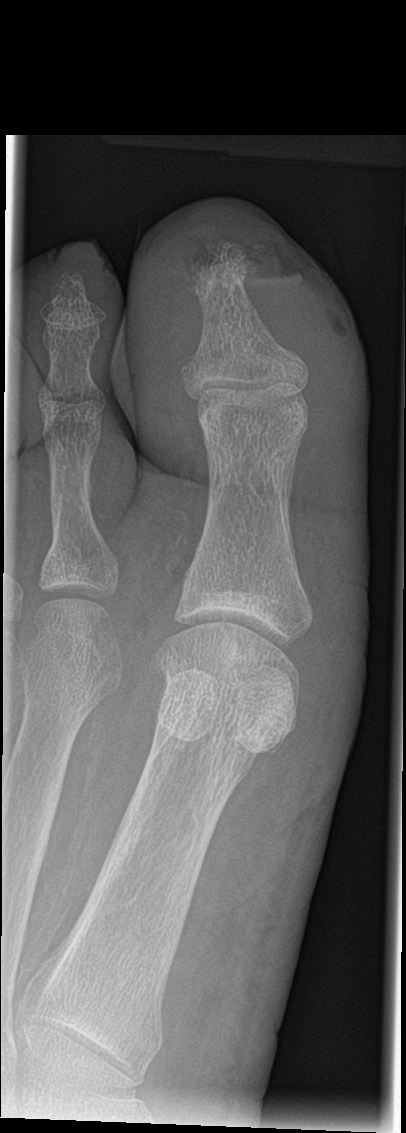

[toe obl]
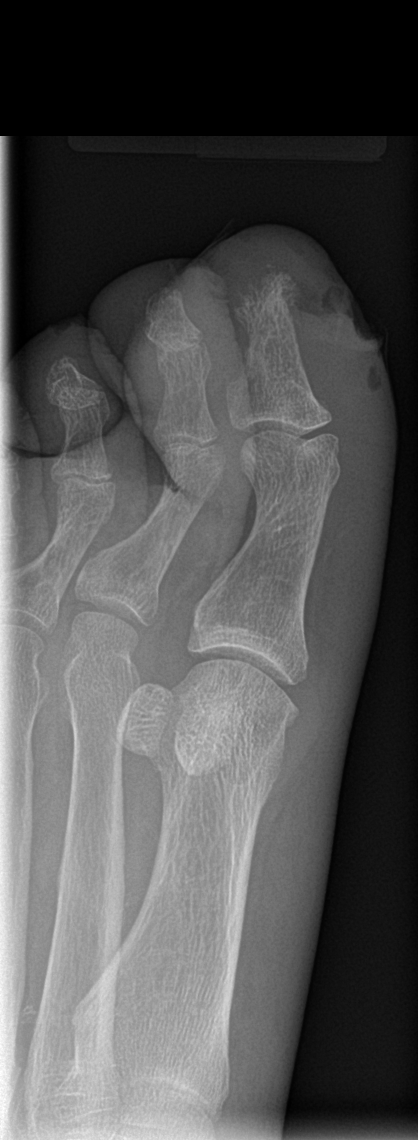

[toe lat]
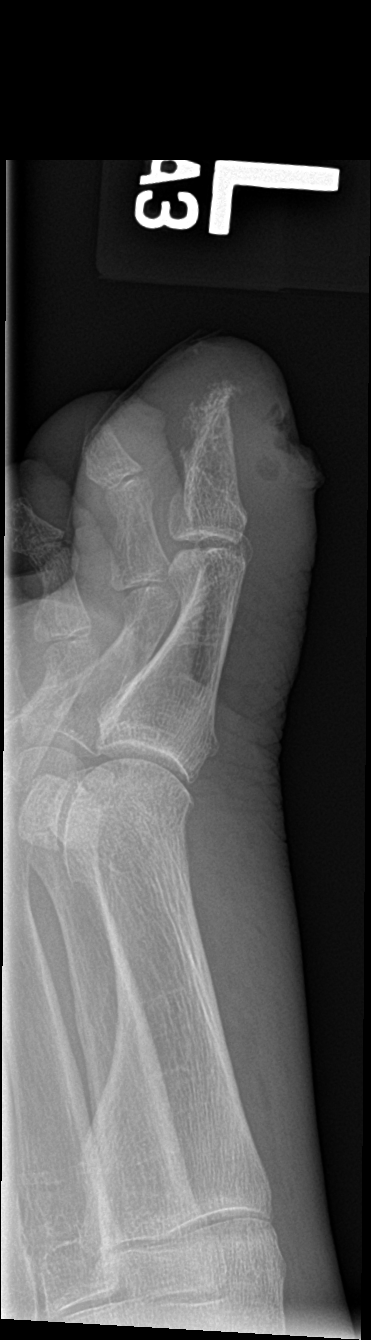

[3 of 3 positions shown; findings below may reference images not displayed]

FINDINGS: Soft tissue swelling and ulcer is seen overlying the distal phalanx.
Osteolysis is seen involving the tuft of the distal phalanx,
consistent with osteomyelitis. No other bone lesions identified.
IMPRESSION: Osteomyelitis involving tuft of distal phalanx of great toe.

## 2018-02-04 NOTE — Telephone Encounter (Signed)
Spoke with patient about scheduling appointment for her ct results. Was busy at the time and said she will call back to schedule.

## 2018-02-05 IMAGING — DX DG KNEE COMPLETE 4+V*R*
4 series · 4 of 4 positions shown · non-contrast
Comparison: 01/06/2012

CLINICAL DATA: Fall, injury to right knee

EXAM:
RIGHT KNEE - COMPLETE 4+ VIEW

[knee ap]
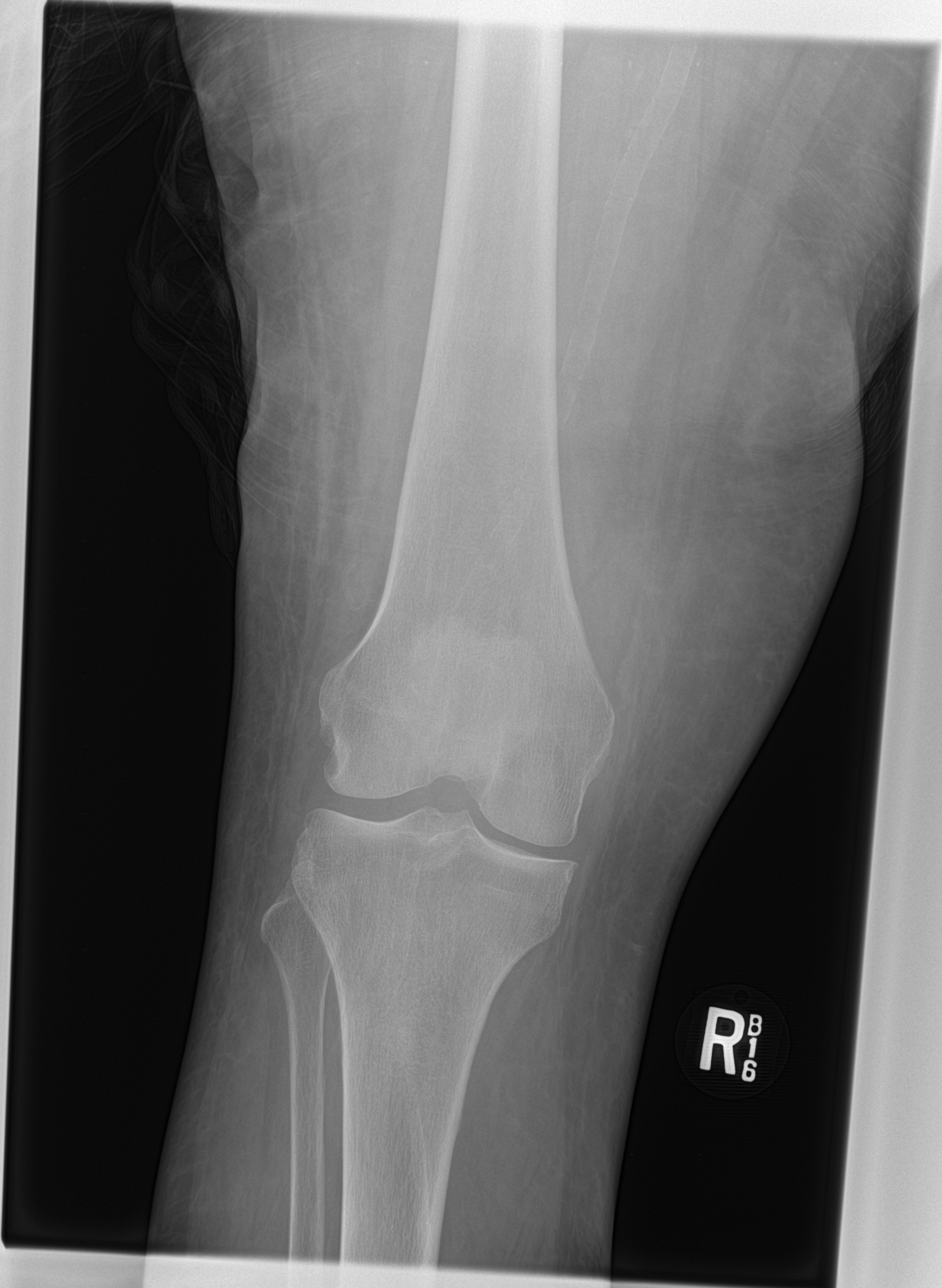

[knee tunnel]
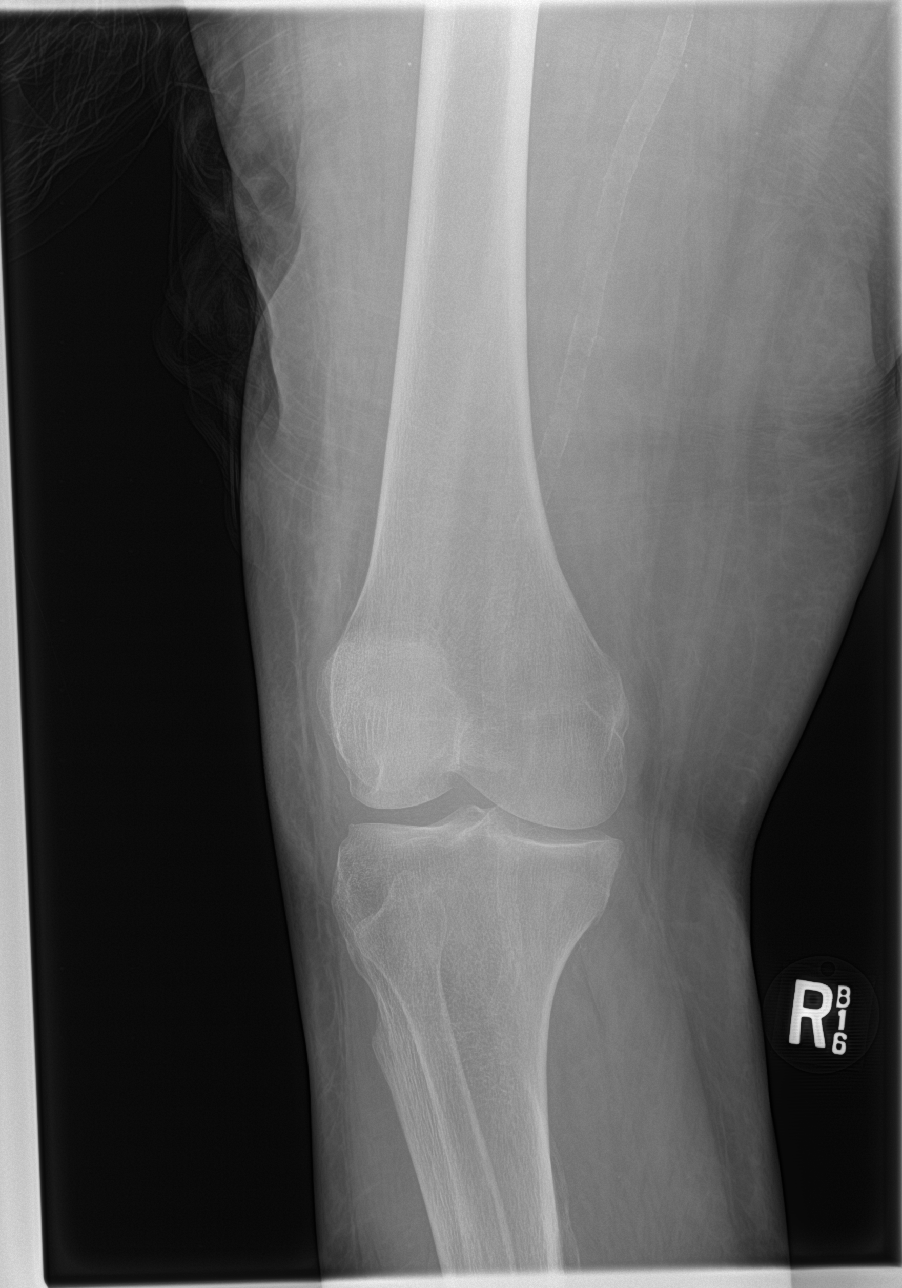

[knee lat]
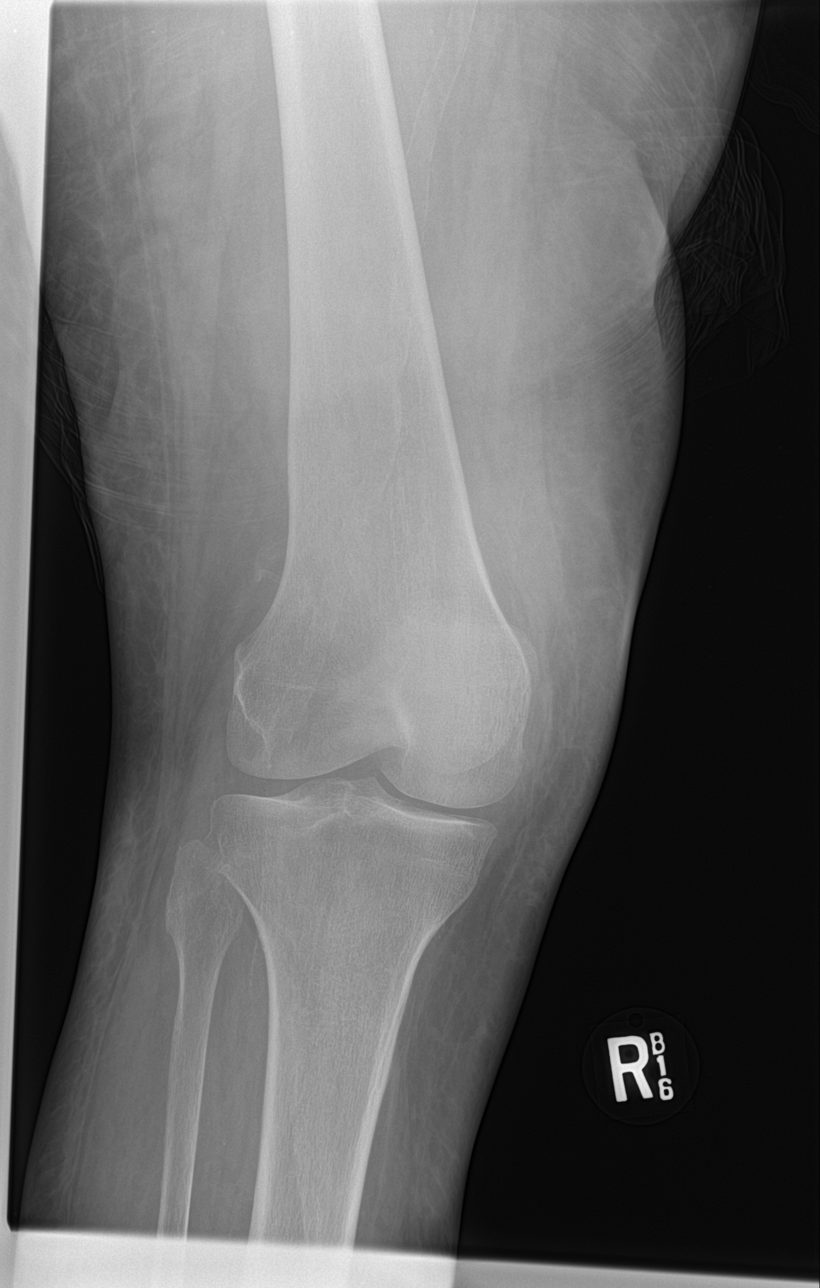

[knee obl]
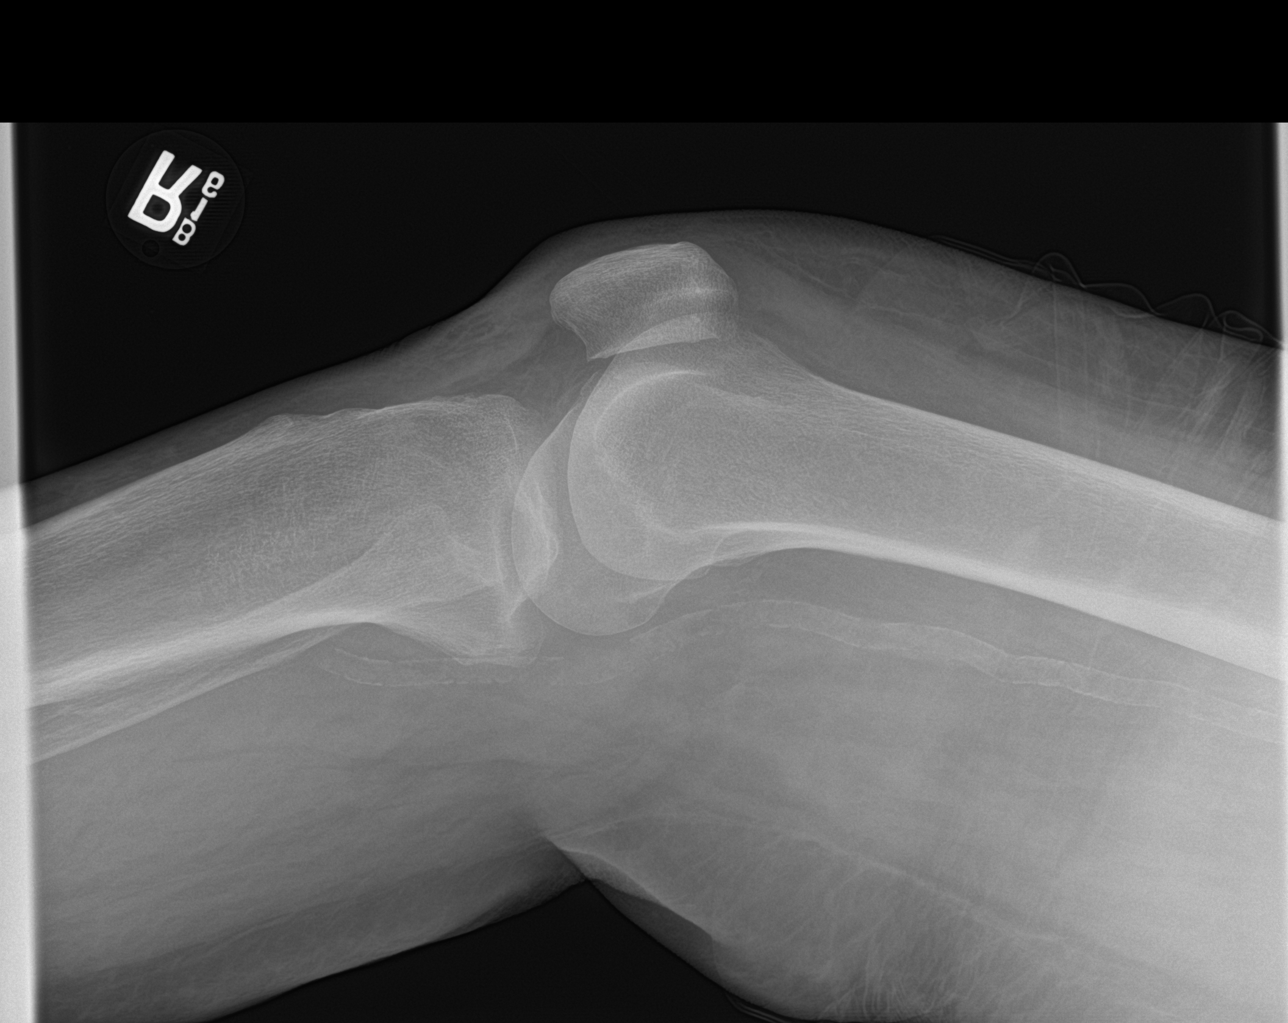

[4 of 4 positions shown; findings below may reference images not displayed]

FINDINGS: No acute bony abnormality. Specifically, no fracture, subluxation,
or dislocation. No joint effusion. Joint spaces maintained. Vascular
calcifications noted.
IMPRESSION: No acute bony abnormality.

## 2018-02-08 IMAGING — CR DG ABDOMEN 2V
1 series · 2 of 2 positions shown · non-contrast
Comparison: Abdomen and pelvis CT dated 09/03/2017.

CLINICAL DATA: Nausea and vomiting. Status post foot surgery
yesterday.

EXAM:
ABDOMEN - 2 VIEW

[Series 1: dg abd 2 views · 0.14mm/px · 2 of 2 slices shown]
[im 1/2]
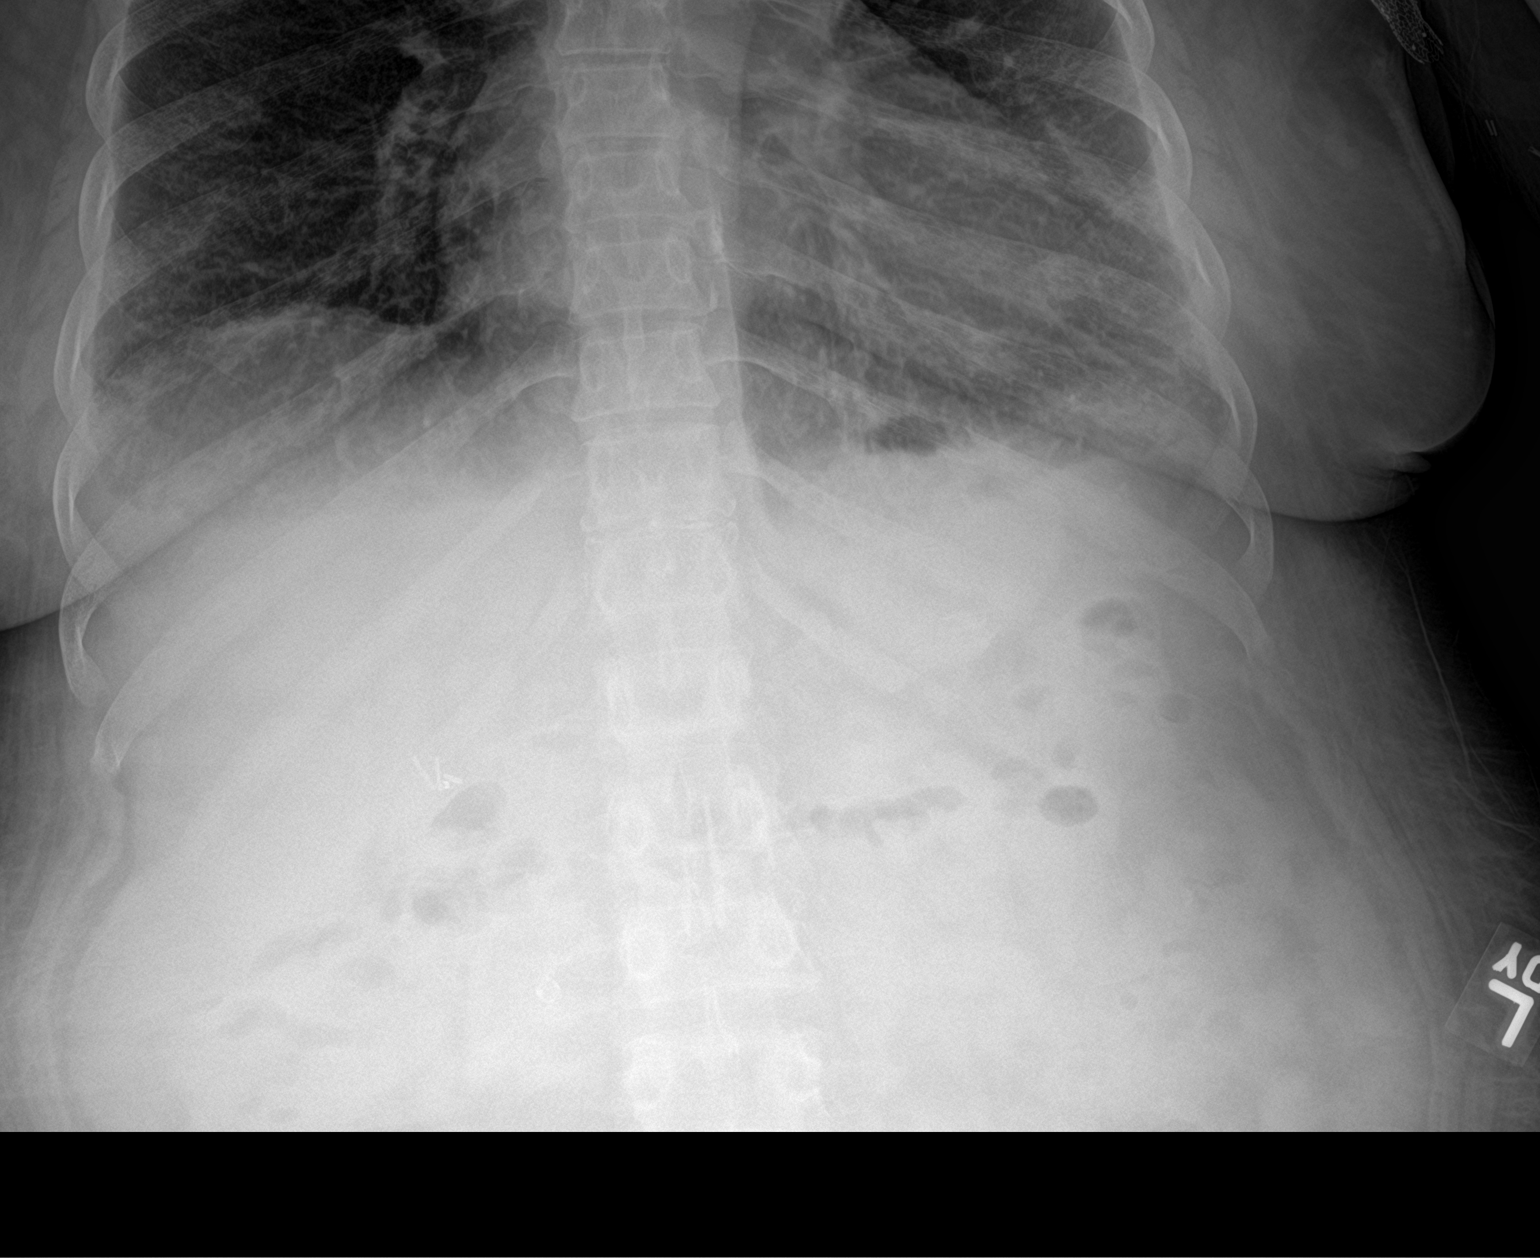
[im 2/2]
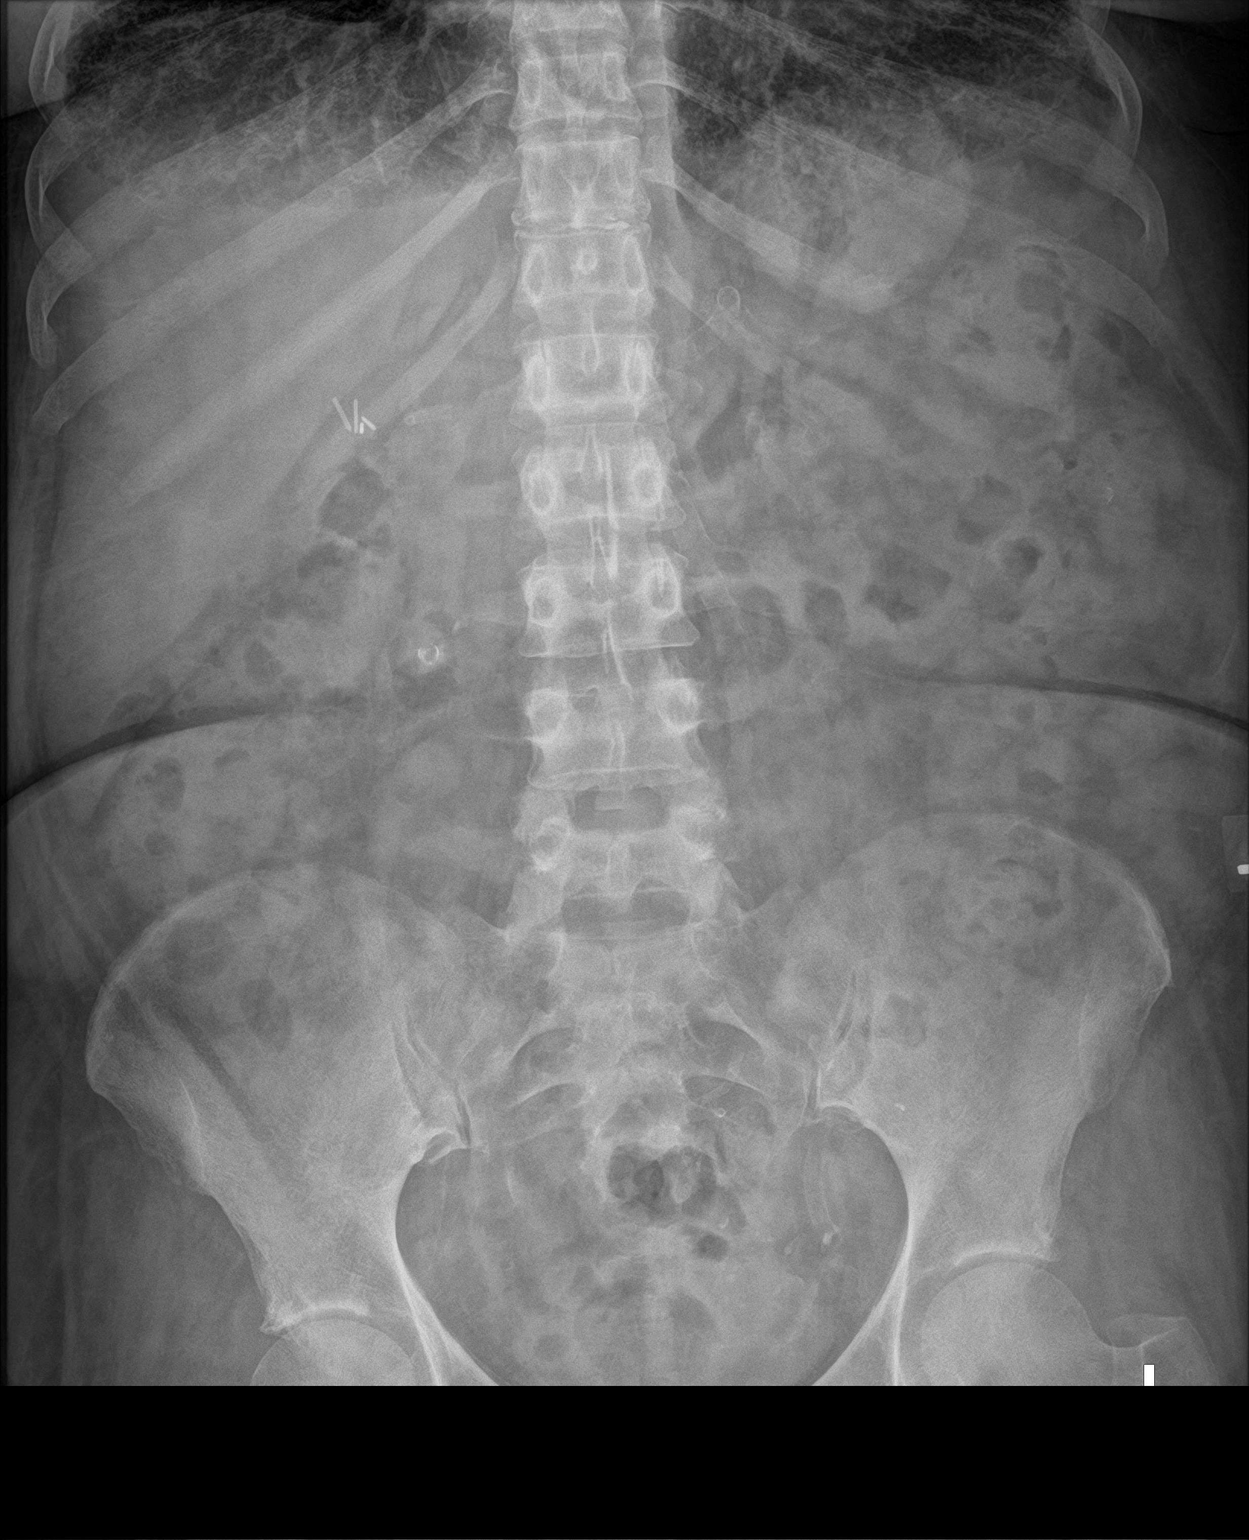

[2 of 2 positions shown; findings below may reference images not displayed]

FINDINGS: Normal bowel gas pattern without free peritoneal air.
Cholecystectomy clips. Arterial calcifications. Unremarkable bones.
IMPRESSION: No acute abnormality.

## 2018-02-12 ENCOUNTER — Emergency Department: Payer: Medicare Other

## 2018-02-12 ENCOUNTER — Inpatient Hospital Stay
Admission: EM | Admit: 2018-02-12 | Discharge: 2018-02-15 | DRG: 193 | Disposition: A | Discharge status: Home Health | Payer: Medicare Other | Attending: Internal Medicine | Admitting: Internal Medicine

## 2018-02-12 ENCOUNTER — Other Ambulatory Visit: Payer: Self-pay

## 2018-02-12 DIAGNOSIS — Z87891 Personal history of nicotine dependence: Secondary | ICD-10-CM

## 2018-02-12 DIAGNOSIS — N186 End stage renal disease: Secondary | ICD-10-CM | POA: Diagnosis not present

## 2018-02-12 DIAGNOSIS — I081 Rheumatic disorders of both mitral and tricuspid valves: Secondary | ICD-10-CM | POA: Diagnosis present

## 2018-02-12 DIAGNOSIS — Z9842 Cataract extraction status, left eye: Secondary | ICD-10-CM

## 2018-02-12 DIAGNOSIS — Z85528 Personal history of other malignant neoplasm of kidney: Secondary | ICD-10-CM

## 2018-02-12 DIAGNOSIS — K219 Gastro-esophageal reflux disease without esophagitis: Secondary | ICD-10-CM | POA: Diagnosis present

## 2018-02-12 DIAGNOSIS — Z992 Dependence on renal dialysis: Secondary | ICD-10-CM

## 2018-02-12 DIAGNOSIS — K861 Other chronic pancreatitis: Secondary | ICD-10-CM | POA: Diagnosis present

## 2018-02-12 DIAGNOSIS — Z9841 Cataract extraction status, right eye: Secondary | ICD-10-CM

## 2018-02-12 DIAGNOSIS — E1143 Type 2 diabetes mellitus with diabetic autonomic (poly)neuropathy: Secondary | ICD-10-CM | POA: Diagnosis present

## 2018-02-12 DIAGNOSIS — J440 Chronic obstructive pulmonary disease with acute lower respiratory infection: Secondary | ICD-10-CM | POA: Diagnosis present

## 2018-02-12 DIAGNOSIS — H9190 Unspecified hearing loss, unspecified ear: Secondary | ICD-10-CM | POA: Diagnosis present

## 2018-02-12 DIAGNOSIS — E11319 Type 2 diabetes mellitus with unspecified diabetic retinopathy without macular edema: Secondary | ICD-10-CM | POA: Diagnosis present

## 2018-02-12 DIAGNOSIS — Z9049 Acquired absence of other specified parts of digestive tract: Secondary | ICD-10-CM

## 2018-02-12 DIAGNOSIS — Z809 Family history of malignant neoplasm, unspecified: Secondary | ICD-10-CM

## 2018-02-12 DIAGNOSIS — J189 Pneumonia, unspecified organism: Secondary | ICD-10-CM | POA: Diagnosis present

## 2018-02-12 DIAGNOSIS — N2581 Secondary hyperparathyroidism of renal origin: Secondary | ICD-10-CM | POA: Diagnosis present

## 2018-02-12 DIAGNOSIS — I252 Old myocardial infarction: Secondary | ICD-10-CM

## 2018-02-12 DIAGNOSIS — Z79899 Other long term (current) drug therapy: Secondary | ICD-10-CM

## 2018-02-12 DIAGNOSIS — Z79891 Long term (current) use of opiate analgesic: Secondary | ICD-10-CM

## 2018-02-12 DIAGNOSIS — Z841 Family history of disorders of kidney and ureter: Secondary | ICD-10-CM

## 2018-02-12 DIAGNOSIS — Z7982 Long term (current) use of aspirin: Secondary | ICD-10-CM

## 2018-02-12 DIAGNOSIS — J159 Unspecified bacterial pneumonia: Secondary | ICD-10-CM | POA: Diagnosis not present

## 2018-02-12 DIAGNOSIS — E1122 Type 2 diabetes mellitus with diabetic chronic kidney disease: Secondary | ICD-10-CM | POA: Diagnosis present

## 2018-02-12 DIAGNOSIS — I5032 Chronic diastolic (congestive) heart failure: Secondary | ICD-10-CM | POA: Diagnosis present

## 2018-02-12 DIAGNOSIS — Z7902 Long term (current) use of antithrombotics/antiplatelets: Secondary | ICD-10-CM

## 2018-02-12 DIAGNOSIS — I132 Hypertensive heart and chronic kidney disease with heart failure and with stage 5 chronic kidney disease, or end stage renal disease: Secondary | ICD-10-CM | POA: Diagnosis present

## 2018-02-12 DIAGNOSIS — Z833 Family history of diabetes mellitus: Secondary | ICD-10-CM

## 2018-02-12 DIAGNOSIS — Z8701 Personal history of pneumonia (recurrent): Secondary | ICD-10-CM

## 2018-02-12 DIAGNOSIS — Z91048 Other nonmedicinal substance allergy status: Secondary | ICD-10-CM

## 2018-02-12 DIAGNOSIS — I2510 Atherosclerotic heart disease of native coronary artery without angina pectoris: Secondary | ICD-10-CM | POA: Diagnosis present

## 2018-02-12 DIAGNOSIS — E11649 Type 2 diabetes mellitus with hypoglycemia without coma: Secondary | ICD-10-CM | POA: Diagnosis not present

## 2018-02-12 DIAGNOSIS — E785 Hyperlipidemia, unspecified: Secondary | ICD-10-CM | POA: Diagnosis present

## 2018-02-12 DIAGNOSIS — Z794 Long term (current) use of insulin: Secondary | ICD-10-CM

## 2018-02-12 DIAGNOSIS — Z89412 Acquired absence of left great toe: Secondary | ICD-10-CM

## 2018-02-12 DIAGNOSIS — F329 Major depressive disorder, single episode, unspecified: Secondary | ICD-10-CM | POA: Diagnosis present

## 2018-02-12 DIAGNOSIS — R0902 Hypoxemia: Secondary | ICD-10-CM | POA: Diagnosis present

## 2018-02-12 DIAGNOSIS — Z90710 Acquired absence of both cervix and uterus: Secondary | ICD-10-CM

## 2018-02-12 DIAGNOSIS — D631 Anemia in chronic kidney disease: Secondary | ICD-10-CM | POA: Diagnosis present

## 2018-02-12 DIAGNOSIS — Z885 Allergy status to narcotic agent status: Secondary | ICD-10-CM

## 2018-02-12 DIAGNOSIS — F419 Anxiety disorder, unspecified: Secondary | ICD-10-CM | POA: Diagnosis present

## 2018-02-12 DIAGNOSIS — R55 Syncope and collapse: Secondary | ICD-10-CM | POA: Diagnosis not present

## 2018-02-12 DIAGNOSIS — Z961 Presence of intraocular lens: Secondary | ICD-10-CM | POA: Diagnosis present

## 2018-02-12 LAB — CBC WITH DIFFERENTIAL/PLATELET
Basophils Absolute: 0 10*3/uL (ref 0–0.1)
Basophils Relative: 0 %
Eosinophils Absolute: 0.3 10*3/uL (ref 0–0.7)
Eosinophils Relative: 3 %
HCT: 30.1 % — ABNORMAL LOW (ref 35.0–47.0)
Hemoglobin: 9.9 g/dL — ABNORMAL LOW (ref 12.0–16.0)
Lymphocytes Relative: 16 %
Lymphs Abs: 1.3 10*3/uL (ref 1.0–3.6)
MCH: 30.7 pg (ref 26.0–34.0)
MCHC: 32.8 g/dL (ref 32.0–36.0)
MCV: 93.7 fL (ref 80.0–100.0)
Monocytes Absolute: 0.7 10*3/uL (ref 0.2–0.9)
Monocytes Relative: 9 %
Neutro Abs: 6.1 10*3/uL (ref 1.4–6.5)
Neutrophils Relative %: 72 %
Platelets: 168 10*3/uL (ref 150–440)
RBC: 3.21 MIL/uL — ABNORMAL LOW (ref 3.80–5.20)
RDW: 18.6 % — ABNORMAL HIGH (ref 11.5–14.5)
WBC: 8.5 10*3/uL (ref 3.6–11.0)

## 2018-02-12 LAB — COMPREHENSIVE METABOLIC PANEL
ALT: 11 U/L — ABNORMAL LOW (ref 14–54)
AST: 21 U/L (ref 15–41)
Albumin: 3.1 g/dL — ABNORMAL LOW (ref 3.5–5.0)
Alkaline Phosphatase: 100 U/L (ref 38–126)
Anion gap: 13 (ref 5–15)
BUN: 41 mg/dL — ABNORMAL HIGH (ref 6–20)
CO2: 27 mmol/L (ref 22–32)
Calcium: 7.9 mg/dL — ABNORMAL LOW (ref 8.9–10.3)
Chloride: 95 mmol/L — ABNORMAL LOW (ref 101–111)
Creatinine, Ser: 7.55 mg/dL — ABNORMAL HIGH (ref 0.44–1.00)
GFR calc Af Amer: 6 mL/min — ABNORMAL LOW (ref >60)
GFR calc non Af Amer: 5 mL/min — ABNORMAL LOW (ref >60)
Glucose, Bld: 69 mg/dL (ref 65–99)
Potassium: 4.6 mmol/L (ref 3.5–5.1)
Sodium: 135 mmol/L (ref 135–145)
Total Bilirubin: 0.6 mg/dL (ref 0.3–1.2)
Total Protein: 7.1 g/dL (ref 6.5–8.1)

## 2018-02-12 LAB — PHOSPHORUS: Phosphorus: 3.7 mg/dL (ref 2.5–4.6)

## 2018-02-12 LAB — BLOOD GAS, VENOUS
Acid-Base Excess: 5.3 mmol/L — ABNORMAL HIGH (ref 0.0–2.0)
Bicarbonate: 31.5 mmol/L — ABNORMAL HIGH (ref 20.0–28.0)
O2 Saturation: 71.5 %
Patient temperature: 37
pCO2, Ven: 52 mmHg (ref 44.0–60.0)
pH, Ven: 7.39 (ref 7.250–7.430)
pO2, Ven: 38 mmHg (ref 32.0–45.0)

## 2018-02-12 LAB — GLUCOSE, CAPILLARY
Glucose-Capillary: 75 mg/dL (ref 65–99)
Glucose-Capillary: 78 mg/dL (ref 65–99)
Glucose-Capillary: 55 mg/dL — ABNORMAL LOW (ref 65–99)
Glucose-Capillary: 100 mg/dL — ABNORMAL HIGH (ref 65–99)

## 2018-02-12 LAB — TROPONIN I: Troponin I: 0.03 ng/mL (ref <0.03)

## 2018-02-12 LAB — LACTIC ACID, PLASMA: Lactic Acid, Venous: 0.7 mmol/L (ref 0.5–1.9)

## 2018-02-12 MED ORDER — LEVOFLOXACIN IN D5W 250 MG/50ML IV SOLN: 250.0000 mg | INTRAVENOUS | Status: DC

## 2018-02-12 MED ORDER — LEVOFLOXACIN IN D5W 500 MG/100ML IV SOLN
500.0000 mg | INTRAVENOUS | Status: DC
  Filled 2018-02-12: qty 100

## 2018-02-12 MED ORDER — ATORVASTATIN CALCIUM 20 MG PO TABS
20.0000 mg | ORAL_TABLET | Freq: Every evening | ORAL | Status: DC
  Administered 2018-02-12 – 2018-02-14 (×3): 20 mg via ORAL
  Filled 2018-02-12 (×3): qty 1

## 2018-02-12 MED ORDER — INSULIN ASPART 100 UNIT/ML ~~LOC~~ SOLN
0.0000 [IU] | Freq: Three times a day (TID) | SUBCUTANEOUS | Status: DC
  Administered 2018-02-14: 12:00:00 1 [IU] via SUBCUTANEOUS
  Filled 2018-02-12: qty 1

## 2018-02-12 MED ORDER — VITAMIN B-12 1000 MCG PO TABS
1000.0000 ug | ORAL_TABLET | Freq: Every day | ORAL | Status: DC
  Administered 2018-02-12 – 2018-02-15 (×4): 1000 ug via ORAL
  Filled 2018-02-12 (×4): qty 1

## 2018-02-12 MED ORDER — RIFAXIMIN 550 MG PO TABS
550.0000 mg | ORAL_TABLET | Freq: Two times a day (BID) | ORAL | Status: DC
  Administered 2018-02-12 – 2018-02-15 (×7): 550 mg via ORAL
  Filled 2018-02-12 (×7): qty 1

## 2018-02-12 MED ORDER — LEVOFLOXACIN IN D5W 750 MG/150ML IV SOLN
750.0000 mg | Freq: Once | INTRAVENOUS | Status: AC
  Administered 2018-02-12: 750 mg via INTRAVENOUS
  Filled 2018-02-12: qty 150

## 2018-02-12 MED ORDER — ALBUTEROL SULFATE HFA 108 (90 BASE) MCG/ACT IN AERS: 2.0000 | INHALATION_SPRAY | Freq: Four times a day (QID) | RESPIRATORY_TRACT | Status: DC | PRN

## 2018-02-12 MED ORDER — EPOETIN ALFA 4000 UNIT/ML IJ SOLN
4000.0000 [IU] | INTRAMUSCULAR | Status: DC
  Administered 2018-02-12 – 2018-02-15 (×2): 4000 [IU] via INTRAVENOUS
  Filled 2018-02-12 (×2): qty 1

## 2018-02-12 MED ORDER — FAMOTIDINE 20 MG PO TABS
20.0000 mg | ORAL_TABLET | Freq: Every day | ORAL | Status: DC
  Administered 2018-02-12 – 2018-02-15 (×4): 20 mg via ORAL
  Filled 2018-02-12 (×4): qty 1

## 2018-02-12 MED ORDER — SEVELAMER CARBONATE 800 MG PO TABS
800.0000 mg | ORAL_TABLET | Freq: Two times a day (BID) | ORAL | Status: DC
  Administered 2018-02-12 – 2018-02-14 (×5): 800 mg via ORAL
  Filled 2018-02-12 (×7): qty 1

## 2018-02-12 MED ORDER — BISACODYL 5 MG PO TBEC: 5.0000 mg | DELAYED_RELEASE_TABLET | Freq: Every day | ORAL | Status: DC | PRN

## 2018-02-12 MED ORDER — ACETAMINOPHEN 325 MG PO TABS
650.0000 mg | ORAL_TABLET | Freq: Four times a day (QID) | ORAL | Status: DC | PRN
  Administered 2018-02-12 (×2): 650 mg via ORAL
  Filled 2018-02-12 (×2): qty 2

## 2018-02-12 MED ORDER — TRAZODONE HCL 50 MG PO TABS
25.0000 mg | ORAL_TABLET | Freq: Every evening | ORAL | Status: DC | PRN
  Administered 2018-02-13 – 2018-02-14 (×2): 25 mg via ORAL
  Filled 2018-02-12 (×2): qty 1

## 2018-02-12 MED ORDER — ASPIRIN EC 81 MG PO TBEC
81.0000 mg | DELAYED_RELEASE_TABLET | Freq: Every day | ORAL | Status: DC
  Administered 2018-02-12 – 2018-02-15 (×4): 81 mg via ORAL
  Filled 2018-02-12 (×4): qty 1

## 2018-02-12 MED ORDER — CARVEDILOL 25 MG PO TABS
25.0000 mg | ORAL_TABLET | Freq: Two times a day (BID) | ORAL | Status: DC
  Administered 2018-02-13 – 2018-02-14 (×4): 25 mg via ORAL
  Filled 2018-02-12 (×5): qty 1

## 2018-02-12 MED ORDER — PANCRELIPASE (LIP-PROT-AMYL) 12000-38000 UNITS PO CPEP
36000.0000 [IU] | ORAL_CAPSULE | Freq: Three times a day (TID) | ORAL | Status: DC
  Administered 2018-02-13 – 2018-02-15 (×7): 36000 [IU] via ORAL
  Filled 2018-02-12 (×9): qty 3

## 2018-02-12 MED ORDER — CLOPIDOGREL BISULFATE 75 MG PO TABS
75.0000 mg | ORAL_TABLET | Freq: Every day | ORAL | Status: DC
  Administered 2018-02-12 – 2018-02-15 (×4): 75 mg via ORAL
  Filled 2018-02-12 (×4): qty 1

## 2018-02-12 MED ORDER — RISAQUAD PO CAPS
1.0000 | ORAL_CAPSULE | Freq: Every day | ORAL | Status: DC
  Administered 2018-02-12 – 2018-02-15 (×4): 1 via ORAL
  Filled 2018-02-12 (×4): qty 1

## 2018-02-12 MED ORDER — ALBUTEROL SULFATE (2.5 MG/3ML) 0.083% IN NEBU: 2.5000 mg | INHALATION_SOLUTION | RESPIRATORY_TRACT | Status: DC | PRN

## 2018-02-12 MED ORDER — BIOTIN 1 MG PO CAPS: 1.0000 mg | ORAL_CAPSULE | Freq: Every day | ORAL | Status: DC

## 2018-02-12 MED ORDER — ENOXAPARIN SODIUM 40 MG/0.4ML ~~LOC~~ SOLN: 40.0000 mg | SUBCUTANEOUS | Status: DC

## 2018-02-12 MED ORDER — SEVELAMER CARBONATE 800 MG PO TABS
2400.0000 mg | ORAL_TABLET | Freq: Three times a day (TID) | ORAL | Status: DC
  Administered 2018-02-13: 2400 mg via ORAL
  Filled 2018-02-12 (×3): qty 3

## 2018-02-12 MED ORDER — INSULIN DETEMIR 100 UNIT/ML ~~LOC~~ SOLN
4.0000 [IU] | Freq: Every day | SUBCUTANEOUS | Status: DC
  Filled 2018-02-12 (×2): qty 0.04

## 2018-02-12 MED ORDER — PANTOPRAZOLE SODIUM 40 MG PO TBEC
40.0000 mg | DELAYED_RELEASE_TABLET | Freq: Every day | ORAL | Status: DC
  Administered 2018-02-12 – 2018-02-15 (×4): 40 mg via ORAL
  Filled 2018-02-12 (×4): qty 1

## 2018-02-12 MED ORDER — PREGABALIN 75 MG PO CAPS
75.0000 mg | ORAL_CAPSULE | Freq: Two times a day (BID) | ORAL | Status: DC
  Filled 2018-02-12 (×3): qty 1

## 2018-02-12 MED ORDER — VANCOMYCIN HCL IN DEXTROSE 1-5 GM/200ML-% IV SOLN
1000.0000 mg | Freq: Once | INTRAVENOUS | Status: AC
  Administered 2018-02-12: 22:00:00 1000 mg via INTRAVENOUS
  Filled 2018-02-12: qty 200

## 2018-02-12 MED ORDER — CHLORHEXIDINE GLUCONATE CLOTH 2 % EX PADS
6.0000 | MEDICATED_PAD | Freq: Every day | CUTANEOUS | Status: DC
  Administered 2018-02-14 – 2018-02-15 (×2): 6 via TOPICAL

## 2018-02-12 MED ORDER — ACETAMINOPHEN 650 MG RE SUPP: 650.0000 mg | Freq: Four times a day (QID) | RECTAL | Status: DC | PRN

## 2018-02-12 MED ORDER — FLUOXETINE HCL 20 MG PO CAPS
20.0000 mg | ORAL_CAPSULE | Freq: Every day | ORAL | Status: DC
  Administered 2018-02-12 – 2018-02-14 (×3): 20 mg via ORAL
  Filled 2018-02-12 (×4): qty 1

## 2018-02-12 MED ORDER — HEPARIN SODIUM (PORCINE) 5000 UNIT/ML IJ SOLN
5000.0000 [IU] | Freq: Three times a day (TID) | INTRAMUSCULAR | Status: DC
  Administered 2018-02-12 – 2018-02-15 (×8): 5000 [IU] via SUBCUTANEOUS
  Filled 2018-02-12 (×8): qty 1

## 2018-02-12 MED ORDER — FAMOTIDINE 20 MG PO TABS: 10.0000 mg | ORAL_TABLET | Freq: Every day | ORAL | Status: DC

## 2018-02-12 MED ORDER — AMLODIPINE BESYLATE 5 MG PO TABS
10.0000 mg | ORAL_TABLET | Freq: Every day | ORAL | Status: DC
  Filled 2018-02-12: qty 2

## 2018-02-12 NOTE — H&P (Signed)
[place] [facility] [place] NAME: [name]    MR#:  [id number]  DATE OF BIRTH:  06/19/1961  DATE OF ADMISSION:  02/12/2018  PRIMARY CARE PHYSICIAN: Freddy Finner, NP   REQUESTING/REFERRING PHYSICIAN: Dr. Esperanza Heir  CHIEF COMPLAINT: Cough, subjective fevers at home   Chief Complaint  Patient presents with  . Weakness    HISTORY OF PRESENT ILLNESS:  [name]  is a 57 y.o. female with a known history of surgery on hemodialysis brought in because of cough, has weakness, not able to get up, patient also had some cough, low-grade fever 91.6 Fahrenheit.  She is due for hemodialysis today, chest x-ray showed bilateral pneumonia not hypoxic.  PAST MEDICAL HISTORY:   Past Medical History:  Diagnosis Date  . Anemia   . Anginal pain ([place])   . Anxiety   . Arthritis   . Asthma   . Broken wrist   . Bronchitis   . chronic diastolic CHF 4/43/1540  . COPD (chronic obstructive pulmonary disease) ([place])   . Coronary artery disease    a. cath 2013: stenting to RCA (report not available); b. cath 2014: LM nl, pLAD 40%, mLAD nl, ost LCx 40%, mid LCx nl, pRCA 30% @ site of prior stent, mRCA 50%  . Depression   . Diabetes mellitus without complication ([place])   . Diabetic neuropathy ([place])   . dialysis 2006  . Diverticulosis   . Dizziness   . Dyspnea   . Elevated lipids   . Environmental and seasonal allergies   . ESRD (end stage renal disease) on dialysis ([place])    M-W-F  . GERD (gastroesophageal reflux disease)   . Headache   . History of hiatal hernia   . HOH (hard of hearing)   . Hx of pancreatitis 2015  . Hypertension   . Lower extremity edema   . Mitral regurgitation    a. echo 10/2013: EF 62%, noWMA, mildly dilated LA, mild to mod MR/TR, GR1DD  . Myocardial infarction ([place])   . Orthopnea   . Pneumonia   . Renal cancer ([place])   . Renal insufficiency    Pt is on dialysis on M,W + F.  . Wheezing     PAST SURGICAL HISTOIRY:    Past Surgical History:  Procedure Laterality Date  . A/V SHUNTOGRAM Left 01/20/2018   Procedure: A/V SHUNTOGRAM;  Surgeon: Algernon Huxley, MD;  Location: [place] [facility];  Service: Cardiovascular;  Laterality: Left;  . ABDOMINAL HYSTERECTOMY  1992  . AMPUTATION TOE Left 10/02/2017   Procedure: AMPUTATION TOE-LEFT GREAT TOE;  Surgeon: Albertine Patricia, DPM;  Location: [facility] OR[facility];  Service: Podiatry;  Laterality: Left;  . APPENDECTOMY    . CARDIAC CATHETERIZATION Left 07/26/2015   Procedure: Left Heart Cath and Coronary Angiography;  Surgeon: Dionisio David, MD;  Location: [place] [facility];  Service: Cardiovascular;  Laterality: Left;  . CATARACT EXTRACTION W/ INTRAOCULAR LENS IMPLANT Right   . CATARACT EXTRACTION W/PHACO Left 03/10/2017   Procedure: CATARACT EXTRACTION PHACO AND INTRAOCULAR LENS PLACEMENT (IOC);  Surgeon: Birder Robson, MD;  Location: [facility];  Service: Ophthalmology;  Laterality: Left;  [place] 00:51.9 AP% 14.2 CDE 7.39 fluid pack lot # [id number]  . CHOLECYSTECTOMY    . COLONOSCOPY WITH PROPOFOL N/A 08/12/2016   Procedure: COLONOSCOPY WITH PROPOFOL;  Surgeon: Lollie Sails, MD;  Location: [facility] ENDOSCOPY;  Service: Endoscopy;  Laterality: N/A;  . DIALYSIS FISTULA CREATION Left  upper arm  . dialysis grafts    . ESOPHAGOGASTRODUODENOSCOPY N/A 03/08/2015   Procedure: ESOPHAGOGASTRODUODENOSCOPY (EGD);  Surgeon: Manya Silvas, MD;  Location: [facility] ENDOSCOPY;  Service: Endoscopy;  Laterality: N/A;  . ESOPHAGOGASTRODUODENOSCOPY (EGD) WITH PROPOFOL N/A 03/18/2016   Procedure: ESOPHAGOGASTRODUODENOSCOPY (EGD) WITH PROPOFOL;  Surgeon: [name], MD;  Location: [facility] ENDOSCOPY;  Service: Endoscopy;  Laterality: N/A;  . EYE SURGERY Right 2018  . FECAL TRANSPLANT N/A 08/23/2015   Procedure: FECAL TRANSPLANT;  Surgeon: Manya Silvas, MD;  Location: [facility] ENDOSCOPY;  Service: Endoscopy;  Laterality: N/A;  . HAND SURGERY Bilateral   . PERIPHERAL VASCULAR CATHETERIZATION N/A  12/20/2015   Procedure: Thrombectomy of dialysis access versus permcath placement;  Surgeon: Algernon Huxley, MD;  Location: [place] [facility];  Service: Cardiovascular;  Laterality: N/A;  . PERIPHERAL VASCULAR CATHETERIZATION N/A 12/20/2015   Procedure: A/V Shunt Intervention;  Surgeon: Algernon Huxley, MD;  Location: [place] [facility];  Service: Cardiovascular;  Laterality: N/A;  . PERIPHERAL VASCULAR CATHETERIZATION N/A 12/20/2015   Procedure: A/V Shuntogram/Fistulagram;  Surgeon: Algernon Huxley, MD;  Location: [place] [facility];  Service: Cardiovascular;  Laterality: N/A;  . PERIPHERAL VASCULAR CATHETERIZATION N/A 01/02/2016   Procedure: A/V Shuntogram/Fistulagram;  Surgeon: Algernon Huxley, MD;  Location: [place] [facility];  Service: Cardiovascular;  Laterality: N/A;  . PERIPHERAL VASCULAR CATHETERIZATION N/A 01/02/2016   Procedure: A/V Shunt Intervention;  Surgeon: Algernon Huxley, MD;  Location: [place] [facility];  Service: Cardiovascular;  Laterality: N/A;    SOCIAL HISTORY:   Social History   Tobacco Use  . Smoking status: Former Smoker    Packs/day: 0.50    Years: 40.00    Pack years: 20.00    Types: Cigarettes    Last attempt to quit: 02/13/2015    Years since quitting: 3.0  . Smokeless tobacco: Never Used  Substance Use Topics  . Alcohol use: Not Currently    Comment: glass wine week per pt    FAMILY HISTORY:   Family History  Problem Relation Age of Onset  . Kidney disease Mother   . Diabetes Mother   . Cancer Father   . Kidney disease Sister     DRUG ALLERGIES:   Allergies  Allergen Reactions  . Ace Inhibitors Swelling and Anaphylaxis  . Ativan [Lorazepam] Other (See Comments)    Reaction:Hallucinations and headaches  . Compazine [Prochlorperazine Edisylate] Anaphylaxis, Nausea And Vomiting and Other (See Comments)    Other reaction(s): dystonia from this vs. Reglan, 23 Jul - patient relates that she takes promethazine frequently with no problems  . Sumatriptan  Succinate Other (See Comments)    Other reaction(s): delirium and hallucinations per [facility] records  . Dilaudid [Hydromorphone Hcl] Other (See Comments)    Delirium   . Ondansetron Other (See Comments)    hallucinations    . Zofran [Ondansetron Hcl] Other (See Comments)    Reaction:  hallucinations   . Codeine Nausea And Vomiting  . Gabapentin Other (See Comments)    Reaction:  Unknown    . Lac Bovis Nausea And Vomiting  . Losartan Nausea Only  . Oxycodone Anxiety  . Prochlorperazine Other (See Comments)    Reaction:  Unknown . Patient does not remember reaction but she does have vertigo and anxiety along with n and v at times. Could be used to treat any of these   . Reglan [Metoclopramide] Other (See Comments)    Per patient her Dr. Evelina Bucy her off it   .  Scopolamine Other (See Comments)    Dizziness, also has vertigo already  . Tape Rash  . Tapentadol Rash    REVIEW OF SYSTEMS:  CONSTITUTIONAL: Subjective fever EYES: No blurred or double vision.  EARS, NOSE, AND THROAT: No tinnitus or ear pain.  RESPIRATORY: Cough, pleuritic chest pain wheezing or hemoptysis.  Does have mild shortness of breath. CARDIOVASCULAR: No chest pain, orthopnea, edema.  GASTROINTESTINAL: No nausea, vomiting, diarrhea or abdominal pain.  GENITOURINARY: No dysuria, hematuria.  And is on hemodialysis ENDOCRINE: No polyuria, nocturia,  HEMATOLOGY: No anemia, easy bruising or bleeding SKIN: No rash or lesion. MUSCULOSKELETAL: No joint pain or arthritis.   NEUROLOGIC: No tingling, numbness, weakness.  PSYCHIATRY: No anxiety or depression.   MEDICATIONS AT HOME:   Prior to Admission medications   Medication Sig Start Date End Date Taking? Authorizing Provider  acidophilus (RISAQUAD) CAPS capsule Take 1 capsule by mouth daily.   Yes [provider]  albuterol (PROVENTIL HFA;VENTOLIN HFA) 108 (90 Base) MCG/ACT inhaler Inhale 2 puffs into the lungs every 6 (six) hours as needed for wheezing or  shortness of breath.    Yes [provider]  albuterol (PROVENTIL) (2.5 MG/3ML) 0.083% nebulizer solution Take 2.5 mg by nebulization every 4 (four) hours as needed for wheezing or shortness of breath.   Yes [provider]  amLODipine (NORVASC) 10 MG tablet Take 10 mg by mouth daily.   Yes [provider]  aspirin EC 81 MG tablet Take 81 mg by mouth daily. 09/08/16  Yes [provider]  atorvastatin (LIPITOR) 20 MG tablet Take 20 mg by mouth every evening.  12/02/16  Yes [provider]  Biotin 1 MG CAPS Take 1 mg by mouth daily.   Yes [provider]  carvedilol (COREG) 25 MG tablet Take 25 mg by mouth 2 (two) times daily with a meal.   Yes [provider]  cetirizine (ZYRTEC) 10 MG tablet Take 10 mg by mouth daily.    Yes [provider]  cholecalciferol (VITAMIN D) 400 units TABS tablet Take 400 Units by mouth daily.   Yes [provider]  clopidogrel (PLAVIX) 75 MG tablet Take 1 tablet (75 mg total) by mouth daily. 03/25/17 03/25/18 Yes Dustin Flock, MD  cyanocobalamin 1000 MCG tablet Take 1,000 mcg by mouth daily.    Yes [provider]  dexlansoprazole (DEXILANT) 60 MG capsule Take 60 mg by mouth daily.   Yes [provider]  diclofenac sodium (VOLTAREN) 1 % GEL Apply 1 application topically daily as needed (for rash).   Yes [provider]  ethyl chloride spray Apply 1 application topically 3 (three) times a week. 01/18/18  Yes [provider]  FLUoxetine (PROZAC) 20 MG capsule Take 1 capsule (20 mg total) by mouth daily. Patient taking differently: Take 20 mg by mouth at bedtime.  04/10/16  Yes Hower, Aaron Mose, MD  glucose 4 GM chewable tablet Chew 3-4 tablets by mouth as needed for low blood sugar (blood sugar less than 60).    Yes [provider]  HYDROcodone-acetaminophen (NORCO/VICODIN) 5-325 MG tablet Take 1 tablet by mouth every 6 (six) hours as needed for moderate  pain or severe pain. Patient taking differently: Take 1 tablet by mouth 3 (three) times daily as needed for moderate pain or severe pain.  05/05/17  Yes Gouru, Illene Silver, MD  hydrocortisone cream 0.5 % Apply topically 3 (three) times daily. 01/21/18  Yes Wieting, Richard, MD  insulin aspart (NOVOLOG FLEXPEN) 100 UNIT/ML  FlexPen Inject 4-15 Units into the skin 4 (four) times daily as needed for high blood sugar (> 100, per sliding scale).   Yes [provider]  insulin detemir (LEVEMIR) 100 UNIT/ML injection Inject 0.06 mLs (6 Units total) into the skin at bedtime. 01/21/18  Yes Wieting, Richard, MD  lipase/protease/amylase (CREON) 12000 UNITS CPEP capsule Take 36000 mg in the morning, 24000 midday & 36000 in the evening   Yes [provider]  LYRICA 75 MG capsule Take 1 capsule by mouth 2 (two) times daily. 02/02/18  Yes [provider]  meclizine (ANTIVERT) 25 MG tablet Take 25 mg by mouth every 6 (six) hours as needed for dizziness.   Yes [provider]  multivitamin (RENA-VIT) TABS tablet Take 1 tablet by mouth daily.    Yes [provider]  nitroGLYCERIN (NITROSTAT) 0.4 MG SL tablet Place 0.4 mg under the tongue every 5 (five) minutes as needed for chest pain.    Yes [provider]  ranitidine (ZANTAC) 300 MG capsule Take 1 capsule by mouth every evening. 01/19/18  Yes [provider]  rifaximin (XIFAXAN) 550 MG TABS tablet Take 550 mg by mouth 2 (two) times daily.   Yes [provider]  sevelamer carbonate (RENVELA) 800 MG tablet Take 800-2,400 mg by mouth See admin instructions. Take 2400 mg by mouth 3 times daily with meals and take 800 mg by mouth with snacks   Yes [provider]  famotidine (PEPCID) 20 MG tablet Take 1 tablet (20 mg total) by mouth daily. Patient not taking: Reported on 02/12/2018 01/21/18 01/21/19  Loletha Grayer, MD      VITAL SIGNS:  Blood pressure (!) 120/47, pulse (!) 59, temperature 98.7 F (37.1  C), temperature source Oral, resp. rate 18, SpO2 100 %.  PHYSICAL EXAMINATION:  GENERAL:  57 y.o.-year-old patient lying in the bed, able to answer questions appropriately but appears chronically ill.  EYES: Pupils equal, round, reactive to lightNo scleral icterus. Extraocular muscles intact.  HEENT: Head atraumatic, normocephalic. Oropharynx and nasopharynx clear.  NECK:  Supple, no jugular venous distention. No thyroid enlargement, no tenderness.  LUNGS: Normal breath sounds bilaterally, scattered  rales, rhonchi.   CARDIOVASCULAR: S1, S2 normal. No murmurs, rubs, or gallops.  ABDOMEN: Soft, nontender, nondistended. Bowel sounds present. No organomegaly or mass.  EXTREMITIES: No pedal edema, cyanosis, or clubbing.  NEUROLOGIC: Cranial nerves II through XII are intact. Muscle strength 5/5 in all extremities. Sensation intact. Gait not checked.  PSYCHIATRIC: The patient is alert and oriented x 3.  SKIN: No obvious rash, lesion, or ulcer.   LABORATORY PANEL:   CBC Recent Labs  Lab 02/12/18 0920  WBC 8.5  HGB 9.9*  HCT 30.1*  PLT 168   ------------------------------------------------------------------------------------------------------------------  Chemistries  Recent Labs  Lab 02/12/18 0920  NA 135  K 4.6  CL 95*  CO2 27  GLUCOSE 69  BUN 41*  CREATININE 7.55*  CALCIUM 7.9*  AST 21  ALT 11*  ALKPHOS 100  BILITOT 0.6   ------------------------------------------------------------------------------------------------------------------  Cardiac Enzymes Recent Labs  Lab 02/12/18 0920  TROPONINI 0.03*   ------------------------------------------------------------------------------------------------------------------  RADIOLOGY:  Dg Chest 2 View  Result Date: 02/12/2018 CLINICAL DATA:  Cough and shortness of breath EXAM: CHEST - 2 VIEW COMPARISON:  01/17/2018 FINDINGS: Cardiac shadow is stable. Diffuse venous stenting is noted in the left upper extremity related to  dialysis shunt. Lungs are well aerated bilaterally. Patchy bibasilar infiltrates are noted left greater than right new from the prior exam.  No sizable effusion is seen. IMPRESSION: New bibasilar patchy infiltrates. Electronically Signed   By: Inez Catalina M.D.   On: 02/12/2018 09:29    EKG:   Orders placed or performed during the hospital encounter of 02/12/18  . ED EKG 12-Lead  . ED EKG 12-Lead  . EKG 12-Lead  . EKG 12-Lead   Normal sinus rhythm with 83 bpm, normal PR interval, normal QRS, normal QT IMPRESSION AND PLAN:   #1 bilateral pneumonia on admission Patient is on IV antibiotics, follow clinical course.  Lactic acid normal on admission.  Place the patient for observation.  Normal CBC, no hypoxia with normal vitals so admit observation status. 2.  ESRD and hemodialysis, nephrology is consulted.  Patient is due for hemodialysis today.  She is on dialysis Monday, Wednesday, Friday. #3 history of CAD, PCI: On aspirin, Plavix, beta-blockers.  Statins. #4 diabetes mellitus type 2; continue sliding scale with coverage, patient is a 30-minute 6 units at bedtime. 5 chronic pancreatitis, patient is on Creon supplements. 6.  Diabetic neuropathy: Patient is on Lyrica 75 mg p.o. twice daily. All the records are reviewed and case discussed with ED provider. Management plans discussed with the patient, family and they are in agreement.  CODE STATUS:full  TOTAL TIME TAKING CARE OF THIS PATIENT:55 minutes.    Epifanio Lesches M.D on 02/12/2018 at 3:11 PM  Between 7am to 6pm - Pager - [phone]  After 6pm go to [email or web address] - password [email or web address] [facility]  [facility]  Office  [phone]  CC: Primary care physician; Freddy Finner, NP  Note: This dictation was prepared with Dragon dictation along with smaller phrase technology. Any transcriptional errors that result from this process are unintentional.

## 2018-02-12 NOTE — Progress Notes (Signed)
Anticoagulation monitoring(Lovenox):  57 yo  56 ordered Lovenox 40 mg Q24h  There were no vitals filed for this visit. BMI   Lab Results  Component Value Date   CREATININE 7.55 (H) 02/12/2018   CREATININE 9.83 (H) 01/19/2018   CREATININE 6.81 (H) 01/17/2018   CrCl cannot be calculated (Unknown ideal weight.). Hemoglobin & Hematocrit     Component Value Date/Time   HGB 9.9 (L) 02/12/2018 0920   HGB 10.5 (L) 09/15/2014 0948   HCT 30.1 (L) 02/12/2018 0920   HCT 34.2 (L) 09/15/2014 8592     Per Protocol for Patient on HD with  BMI < 40, will transition to Heparin 5000 SQ Q8H. Marland Kitchen

## 2018-02-12 NOTE — Progress Notes (Addendum)
Patient was taken into bathroom by daughter and nurse tech with oxygen removed. Nurse tech went to remove patient from toilet after patient pulled call bell and patient had a change in status she was lethargic per nurse tech report and her body was limp. When nurse arrived to room patient was on toilet with head down, charge nurse present vitals were taken and within range except for diastolic BP which was 43. Notified Dr. Jerelyn Charles of event and he gave verbal to D/C Norvasc and hold carvedilol. Nurse tech educated on using bedside commode to prevent exertion relating to pneumonia and sob and verbalized understanding. No changes found with telemetry during episode reported that patient was sinus rhythm.

## 2018-02-12 NOTE — Progress Notes (Signed)
This note also relates to the following rows which could not be included: Pulse Rate - Cannot attach notes to unvalidated device data Resp - Cannot attach notes to unvalidated device data BP - Cannot attach notes to unvalidated device data  Hd completed  

## 2018-02-12 NOTE — Progress Notes (Signed)
Post dialysis assessment 

## 2018-02-12 NOTE — ED Provider Notes (Signed)
[facility] Emergency Department Provider Note       Time seen: ----------------------------------------- 8:56 AM on 02/12/2018 -----------------------------------------   I have reviewed the triage vital signs and the nursing notes.  HISTORY   Chief Complaint Weakness    HPI [name] is a 57 y.o. female with a history of end-stage renal disease on dialysis Monday, Wednesday and Friday, chronic diastolic congestive heart failure, COPD, coronary artery disease, anxiety, pancreatitis, MI and pneumonia who presents to the ED for generalized weakness.  Patient is due for dialysis this morning but was feeling too ill for same.  She was noted to have a very wet cough and to be febrile prior to arrival.  She is sent to the ER for further evaluation.  Patient is complaining of some chest pain on arrival.  He denies any other complaints at this time.  Past Medical History:  Diagnosis Date  . Anemia   . Anginal pain ([place])   . Anxiety   . Arthritis   . Asthma   . Broken wrist   . Bronchitis   . chronic diastolic CHF 2/42/3536  . COPD (chronic obstructive pulmonary disease) ([place])   . Coronary artery disease    a. cath 2013: stenting to RCA (report not available); b. cath 2014: LM nl, pLAD 40%, mLAD nl, ost LCx 40%, mid LCx nl, pRCA 30% @ site of prior stent, mRCA 50%  . Depression   . Diabetes mellitus without complication ([place])   . Diabetic neuropathy ([place])   . dialysis 2006  . Diverticulosis   . Dizziness   . Dyspnea   . Elevated lipids   . Environmental and seasonal allergies   . ESRD (end stage renal disease) on dialysis ([place])    M-W-F  . GERD (gastroesophageal reflux disease)   . Headache   . History of hiatal hernia   . HOH (hard of hearing)   . Hx of pancreatitis 2015  . Hypertension   . Lower extremity edema   . Mitral regurgitation    a. echo 10/2013: EF 62%, noWMA, mildly dilated LA, mild to mod MR/TR, GR1DD  . Myocardial infarction ([place])    . Orthopnea   . Pneumonia   . Renal cancer ([place])   . Renal insufficiency    Pt is on dialysis on M,W + F.  . Wheezing     Patient Active Problem List   Diagnosis Date Noted  . Gastroparesis due to DM ([place]) 01/18/2018  . Complication of vascular access for dialysis 12/04/2017  . Osteomyelitis ([place]) 09/30/2017  . Carotid stenosis 06/18/2017  . Shortness of breath 05/04/2017  . Cellulitis of lower extremity 07/29/2016  . Chronic venous insufficiency 07/29/2016  . Lymphedema 07/29/2016  . TIA (transient ischemic attack) 04/21/2016  . Altered mental status 04/08/2016  . Hyperammonemia ([place]) 04/08/2016  . Elevated troponin 04/08/2016  . Depression 04/08/2016  . Depression, major, recurrent, severe with psychosis ([place]) 04/08/2016  . Blood in stool   . Intractable cyclical vomiting with nausea   . Reflux esophagitis   . Gastritis   . Generalized abdominal pain   . Uncontrollable vomiting   . Major depressive disorder, recurrent episode, moderate ([place]) 03/15/2016  . Adjustment disorder with mixed anxiety and depressed mood 03/15/2016  . Malnutrition of moderate degree 12/01/2015  . Renal mass   . Dyspnea   . Acute renal failure ([place])   . Respiratory failure ([place])   . High temperature 11/14/2015  . Pulmonary edema   .  Encounter for central line placement   . Encounter for orogastric (OG) tube placement   . Nausea 11/12/2015  . Hyperkalemia 10/03/2015  . Diarrhea, unspecified 07/22/2015  . Pneumonia 05/21/2015  . Hypoglycemia 04/24/2015  . Unresponsiveness 04/24/2015  . Bradycardia 04/24/2015  . Hypothermia 04/24/2015  . Acute respiratory failure ([place]) 04/24/2015  . Acute diastolic CHF (congestive heart failure) ([place]) 04/05/2015  . Diabetic gastroparesis ([place]) 04/05/2015  . Hypokalemia 04/05/2015  . Generalized weakness 04/05/2015  . Acute pulmonary edema ([place]) 04/03/2015  . Nausea and vomiting 04/03/2015  . Hypoglycemia associated with diabetes ([place]) 04/03/2015  .  Anemia of chronic disease 04/03/2015  . Secondary hyperparathyroidism ([place]) 04/03/2015  . Pressure ulcer 04/02/2015  . Acute respiratory failure with hypoxia ([place]) 04/01/2015  . Adjustment disorder with anxiety 03/14/2015  . Somatic symptom disorder, mild 03/08/2015  . Coronary artery disease involving native coronary artery of native heart without angina pectoris   . Nausea & vomiting 03/06/2015  . Abdominal pain 03/06/2015  . DM (diabetes mellitus) ([place]) 03/06/2015  . HTN (hypertension) 03/06/2015  . Gastroparesis 02/24/2015  . Pleural effusion 02/19/2015  . HCAP (healthcare-associated pneumonia) 02/19/2015  . End-stage renal disease on hemodialysis ([place]) 02/19/2015    Past Surgical History:  Procedure Laterality Date  . A/V SHUNTOGRAM Left 01/20/2018   Procedure: A/V SHUNTOGRAM;  Surgeon: Algernon Huxley, MD;  Location: [facility] [facility];  Service: Cardiovascular;  Laterality: Left;  . ABDOMINAL HYSTERECTOMY  1992  . AMPUTATION TOE Left 10/02/2017   Procedure: AMPUTATION TOE-LEFT GREAT TOE;  Surgeon: Albertine Patricia, DPM;  Location: [facility]S;  Service: Podiatry;  Laterality: Left;  . APPENDECTOMY    . CARDIAC CATHETERIZATION Left 07/26/2015   Procedure: Left Heart Cath and Coronary Angiography;  Surgeon: Dionisio David, MD;  Location: [facility];  Service: Cardiovascular;  Laterality: Left;  . CATARACT EXTRACTION W/ INTRAOCULAR LENS IMPLANT Right   . CATARACT EXTRACTION W/PHACO Left 03/10/2017   Procedure: CATARACT EXTRACTION PHACO AND INTRAOCULAR LENS PLACEMENT (IOC);  Surgeon: Birder Robson, MD;  Location: [facility];  Service: Ophthalmology;  Laterality: Left;  [place] 00:51.9 AP% 14.2 CDE 7.39 fluid pack lot # [id number]  . CHOLECYSTECTOMY    . COLONOSCOPY WITH PROPOFOL N/A 08/12/2016   Procedure: COLONOSCOPY WITH PROPOFOL;  Surgeon: Lollie Sails, MD;  Location: [facility] ENDOSCOPY;  Service: Endoscopy;  Laterality: N/A;  . DIALYSIS FISTULA CREATION Left    upper arm  .  dialysis grafts    . ESOPHAGOGASTRODUODENOSCOPY N/A 03/08/2015   Procedure: ESOPHAGOGASTRODUODENOSCOPY (EGD);  Surgeon: Manya Silvas, MD;  Location: [facility] ENDOSCOPY;  Service: Endoscopy;  Laterality: N/A;  . ESOPHAGOGASTRODUODENOSCOPY (EGD) WITH PROPOFOL N/A 03/18/2016   Procedure: ESOPHAGOGASTRODUODENOSCOPY (EGD) WITH PROPOFOL;  Surgeon: [name], MD;  Location: [facility] ENDOSCOPY;  Service: Endoscopy;  Laterality: N/A;  . EYE SURGERY Right 2018  . FECAL TRANSPLANT N/A 08/23/2015   Procedure: FECAL TRANSPLANT;  Surgeon: Manya Silvas, MD;  Location: [facility] ENDOSCOPY;  Service: Endoscopy;  Laterality: N/A;  . HAND SURGERY Bilateral   . PERIPHERAL VASCULAR CATHETERIZATION N/A 12/20/2015   Procedure: Thrombectomy of dialysis access versus permcath placement;  Surgeon: Algernon Huxley, MD;  Location: [facility];  Service: Cardiovascular;  Laterality: N/A;  . PERIPHERAL VASCULAR CATHETERIZATION N/A 12/20/2015   Procedure: A/V Shunt Intervention;  Surgeon: Algernon Huxley, MD;  Location: [place] [facility];  Service: Cardiovascular;  Laterality: N/A;  . PERIPHERAL VASCULAR CATHETERIZATION N/A 12/20/2015   Procedure: A/V Shuntogram/Fistulagram;  Surgeon: Corene Cornea  Bunnie Domino, MD;  Location: [facility];  Service: Cardiovascular;  Laterality: N/A;  . PERIPHERAL VASCULAR CATHETERIZATION N/A 01/02/2016   Procedure: A/V Shuntogram/Fistulagram;  Surgeon: Algernon Huxley, MD;  Location: [place] [facility];  Service: Cardiovascular;  Laterality: N/A;  . PERIPHERAL VASCULAR CATHETERIZATION N/A 01/02/2016   Procedure: A/V Shunt Intervention;  Surgeon: Algernon Huxley, MD;  Location: [place] [facility];  Service: Cardiovascular;  Laterality: N/A;    Allergies Ace inhibitors; Ativan [lorazepam]; Compazine [prochlorperazine edisylate]; Sumatriptan succinate; Dilaudid [hydromorphone hcl]; Ondansetron; Zofran [ondansetron hcl]; Codeine; Gabapentin; Lac bovis; Losartan; Oxycodone; Prochlorperazine; Reglan  [metoclopramide]; Scopolamine; Tape; and Tapentadol  Social History Social History   Tobacco Use  . Smoking status: Former Smoker    Packs/day: 0.50    Years: 40.00    Pack years: 20.00    Types: Cigarettes    Last attempt to quit: 02/13/2015    Years since quitting: 3.0  . Smokeless tobacco: Never Used  Substance Use Topics  . Alcohol use: Yes    Alcohol/week: 0.6 oz    Types: 1 Glasses of wine per week    Comment: glass wine week per pt  . Drug use: Yes    Types: Marijuana    Comment: once a day   Review of Systems Constitutional: Positive for fever Cardiovascular: Positive for chest pain Respiratory: Negative for shortness of breath.  Positive for cough Gastrointestinal: Negative for abdominal pain, vomiting and diarrhea. Musculoskeletal: Negative for back pain. Skin: Negative for rash. Neurological: Negative for headaches, focal weakness or numbness.  All systems negative/normal/unremarkable except as stated in the HPI  ____________________________________________   PHYSICAL EXAM:  VITAL SIGNS: ED Triage Vitals  Enc Vitals Group     BP      Pulse      Resp      Temp      Temp src      SpO2      Weight      Height      Head Circumference      Peak Flow      Pain Score      Pain Loc      Pain Edu?      Excl. in [place]?    Constitutional: Alert and oriented.  Mild distress Eyes: Conjunctivae are normal. Normal extraocular movements. ENT   Head: Normocephalic and atraumatic.   Nose: No congestion/rhinnorhea.   Mouth/Throat: Mucous membranes are moist.   Neck: No stridor. Cardiovascular: Normal rate, regular rhythm. No murmurs, rubs, or gallops. Respiratory: Normal respiratory effort without tachypnea nor retractions.  Scattered rales and rhonchi bilaterally Gastrointestinal: Soft and nontender. Normal bowel sounds Musculoskeletal: Nontender with normal range of motion in extremities. No lower extremity tenderness nor edema. Neurologic:   Normal speech and language. No gross focal neurologic deficits are appreciated.  Skin:  Skin is warm, dry and intact. No rash noted. Psychiatric: Depressed mood and affect ____________________________________________  EKG: Interpreted by me.  Sinus rhythm the rate of 63 bpm, normal PR interval, normal QRS, normal QT.  ____________________________________________  ED COURSE:  As part of my medical decision making, I reviewed the following data within the [place] History obtained from family if available, nursing notes, old chart and ekg, as well as notes from prior ED visits. Patient presented for weakness with fever and cough, we [name] assess with labs and imaging as indicated at this time.   Procedures ____________________________________________   LABS (pertinent positives/negatives)  Labs Reviewed  COMPREHENSIVE METABOLIC PANEL - Abnormal; Notable for the following components:      Result Value   Chloride 95 (*)    BUN 41 (*)    Creatinine, Ser 7.55 (*)    Calcium 7.9 (*)    Albumin 3.1 (*)    ALT 11 (*)    GFR calc non Af Amer 5 (*)    GFR calc Af Amer 6 (*)    All other components within normal limits  TROPONIN I - Abnormal; Notable for the following components:   Troponin I 0.03 (*)    All other components within normal limits  CBC WITH DIFFERENTIAL/PLATELET - Abnormal; Notable for the following components:   RBC 3.21 (*)    Hemoglobin 9.9 (*)    HCT 30.1 (*)    RDW 18.6 (*)    All other components within normal limits  BLOOD GAS, VENOUS - Abnormal; Notable for the following components:   Bicarbonate 31.5 (*)    Acid-Base Excess 5.3 (*)    All other components within normal limits  CULTURE, BLOOD (ROUTINE X 2)  CULTURE, BLOOD (ROUTINE X 2)  LACTIC ACID, PLASMA   CRITICAL CARE Performed by: Laurence Aly   Total critical care time: 30 minutes  Critical care time was exclusive of separately billable procedures and treating other  patients.  Critical care was necessary to treat or prevent imminent or life-threatening deterioration.  Critical care was time spent personally by me on the following activities: development of treatment plan with patient and/or surrogate as well as nursing, discussions with consultants, evaluation of patient's response to treatment, examination of patient, obtaining history from patient or surrogate, ordering and performing treatments and interventions, ordering and review of laboratory studies, ordering and review of radiographic studies, pulse oximetry and re-evaluation of patient's condition.  RADIOLOGY Images were viewed by me  Chest x-ray IMPRESSION: New bibasilar patchy infiltrates. ____________________________________________  DIFFERENTIAL DIAGNOSIS   Sepsis, pneumonia, electrolyte abnormality, URI, bronchitis  FINAL ASSESSMENT AND PLAN  Weakness, elevated troponin, bibasilar pneumonia, end-stage renal disease on dialysis   Plan: The patient had presented for weakness and was noted to have fever and cough. Patient's labs other than slightly elevated troponin were grossly unremarkable given her end-stage renal disease. Patient's imaging did reveal a bibasilar infiltrate for which she was started on IV Levaquin.  I discussed with nephrology, she would likely need to be admitted for observation and to receive dialysis as well as IV antibiotics for what appears to be pneumonia.   Laurence Aly, MD   Note: This note was generated in part or whole with voice recognition software. Voice recognition is usually quite accurate but there are transcription errors that can and very often do occur. I apologize for any typographical errors that were not detected and corrected.     Earleen Newport, MD 02/12/18 [phone]

## 2018-02-12 NOTE — Progress Notes (Signed)
[facility], [place] 02/12/18  Subjective:   Patient known to our practice from outpatient dialysis and previous admissions.  At this time she presents for cough, chest pain.  Subjective fevers at home.  Chest x-ray shows new bibasilar patchy infiltrates.  Patient is admitted for evaluation and management of pneumonia  Objective:  Vital signs in last 24 hours:  Temp:  [98.7 F (37.1 C)-99.5 F (37.5 C)] 98.7 F (37.1 C) (05/24 1424) Pulse Rate:  [57-64] 59 (05/24 1424) Resp:  [13-26] 18 (05/24 1330) BP: (120-134)/(45-54) 120/47 (05/24 1424) SpO2:  [94 %-100 %] 100 % (05/24 1424)  Weight change:  There were no vitals filed for this visit.  Intake/Output:   No intake or output data in the 24 hours ending 02/12/18 1450   Physical Exam: General:  Ill-appearing, laying in the bed  HEENT  moist oral mucous membranes  Neck  supple  Pulm/lungs  normal breathing effort, bibasilar crackles  CVS/Heart  regular, no rub or gallop  Abdomen:   Soft, nontender  Extremities:  No edema  Neurologic:  Lethargic but able to answer questions  Skin:  No acute rashes  Access:  Left upper extremity AVG       Basic Metabolic Panel:  Recent Labs  Lab 02/12/18 0920  NA 135  K 4.6  CL 95*  CO2 27  GLUCOSE 69  BUN 41*  CREATININE 7.55*  CALCIUM 7.9*     CBC: Recent Labs  Lab 02/12/18 0920  WBC 8.5  NEUTROABS 6.1  HGB 9.9*  HCT 30.1*  MCV 93.7  PLT 168      Lab Results  Component Value Date   HEPBSAG Negative 05/04/2017   HEPBIGM  06/12/2010    NEGATIVE (NOTE) High levels of Hepatitis B Core IgM antibody are detectable during the acute stage of Hepatitis B. This antibody is used to differentiate current from past HBV infection.       Microbiology:  No results found for this or any previous visit (from the past 240 hour(s)).  Coagulation Studies: No results for input(s): LABPROT, INR in the last 72 hours.  Urinalysis: No results for  input(s): COLORURINE, LABSPEC, PHURINE, GLUCOSEU, HGBUR, BILIRUBINUR, KETONESUR, PROTEINUR, UROBILINOGEN, NITRITE, LEUKOCYTESUR in the last 72 hours.  Invalid input(s): APPERANCEUR    Imaging: Dg Chest 2 View  Result Date: 02/12/2018 CLINICAL DATA:  Cough and shortness of breath EXAM: CHEST - 2 VIEW COMPARISON:  01/17/2018 FINDINGS: Cardiac shadow is stable. Diffuse venous stenting is noted in the left upper extremity related to dialysis shunt. Lungs are well aerated bilaterally. Patchy bibasilar infiltrates are noted left greater than right new from the prior exam. No sizable effusion is seen. IMPRESSION: New bibasilar patchy infiltrates. Electronically Signed   By: Inez Catalina M.D.   On: 02/12/2018 09:29     Medications:   . [START ON 02/14/2018] levofloxacin (LEVAQUIN) IV     . acidophilus  1 capsule Oral Daily  . amLODipine  10 mg Oral Daily  . aspirin EC  81 mg Oral Daily  . atorvastatin  20 mg Oral QPM  . carvedilol  25 mg Oral BID WC  . [START ON 02/13/2018] Chlorhexidine Gluconate Cloth  6 each Topical Q0600  . clopidogrel  75 mg Oral Daily  . epoetin (EPOGEN/PROCRIT) injection  4,000 Units Intravenous Q M,W,F-HD  . famotidine  20 mg Oral Daily  . FLUoxetine  20 mg Oral QHS  . heparin injection (subcutaneous)  5,000 Units Subcutaneous Q8H  .  lipase/protease/amylase  36,000 Units Oral TID AC  . pantoprazole  40 mg Oral Daily  . pregabalin  75 mg Oral BID  . rifaximin  550 mg Oral BID  . sevelamer carbonate  2,400 mg Oral TID WC  . sevelamer carbonate  800 mg Oral BID BM  . cyanocobalamin  1,000 mcg Oral Daily   acetaminophen **OR** acetaminophen, albuterol, bisacodyl, traZODone  Assessment/ Plan:  57 y.o. female with diabetes, hypertension, hyperlipidemia, diabetic retinopathy, diabetic neuropathy, diabetic gastroparesis, pancreatic insufficiency, depression, GERD. Renal masses, M-spike  5/24: Admitted under observation for cough, sputum production and pneumonia  MWF  [facility], left arm AVG  1. ESRD -Hemodialysis today. Orders have been prepared  2.  Anemia of chronic kidney disease Erythropoietin with hemodialysis  3.  Secondary hyperparathyroidism Currently on sevelamer to be given with meals  4.  Pneumonia Antibiotics as per hospitalist team      LOS: 0 Tashon Capp Candiss Norse 5/24/20192:50 PM  [place], [place]  Note: This note was prepared with Dragon dictation. Any transcription errors are unintentional

## 2018-02-12 NOTE — Progress Notes (Signed)
Hd started  

## 2018-02-12 NOTE — ED Notes (Signed)
Date and time results received: 02/12/18 1037 (use smartphrase ".now" to insert current time)  Test: Troponin Critical Value: Troponin: 0.03  Name of Provider Notified: Dr. Jimmye Norman

## 2018-02-12 NOTE — ED Triage Notes (Signed)
Pt arrived via [facility] from home with c/o generalized weakness. EMS states pt has had weakness this morning and was not able to get up. EMS also states that pt has had cough since yesterday and pt was having yellow secretions. EMS states temp 99.6 and has not had dialysis today. Pt on 2L [place] pon arrival.

## 2018-02-12 NOTE — Progress Notes (Addendum)
Hypoglycemic Event  CBG: 55 Treatment: 4oz sprite  Symptoms: none  Follow-up CBG: Time:2203 CBG Result:100  Possible Reasons for Event: dialysis     Sonda Primes

## 2018-02-12 NOTE — Significant Event (Signed)
Rapid Response Event Note  Overview: Time Called: 7494 Arrival Time: 1720 Event Type: Other (Comment)(pt passed out on toilet)  Initial Focused Assessment: Patient was found by staff NT passed out on the toilet.  Patient placed in the bed by staff members- vitals stable- BP 496 systolic- HR 59 brady.  Recheck of blood sugar was 75.  Patient alert but sleepy- complaint of headache which bedside nurse stated that patient complained of headache prior to this episode.    Interventions: Patient given something to drink. Plan of Care (if not transferred): Continue to monitor.   Event Summary:   at      at          [facility]

## 2018-02-12 NOTE — Progress Notes (Signed)
Pre dialysis assessment 

## 2018-02-12 NOTE — Progress Notes (Signed)
PHARMACIST - PHYSICIAN ORDER COMMUNICATION  CONCERNING: P&T Medication Policy on Herbal Medications  DESCRIPTION:  This patient's order for:  Biotin  has been noted.  This product(s) is classified as an "herbal" or natural product. Due to a lack of definitive safety studies or FDA approval, nonstandard manufacturing practices, plus the potential risk of unknown drug-drug interactions while on inpatient medications, the Pharmacy and Therapeutics Committee does not permit the use of "herbal" or natural products of this type within [facility].   ACTION TAKEN: The pharmacy department is unable to verify this order at this time and your patient has been informed of this safety policy. Please reevaluate patient's clinical condition at discharge and address if the herbal or natural product(s) should be resumed at that time.   

## 2018-02-12 NOTE — Progress Notes (Signed)
   02/12/18 1555  Clinical Encounter Type  Visited With Patient  Visit Type Initial  Referral From Nurse  Consult/Referral To Chaplain  Spiritual Encounters  Spiritual Needs Brochure;Other (Comment)   CH received a OR to educate PT on AD. [place] left paperwork with patient to be discussed when her son arrives. CH informed PT that if son had questions to have nurse PG [place].

## 2018-02-12 NOTE — ED Notes (Signed)
IV RN unable to get IV started without blood.

## 2018-02-12 NOTE — Care Management (Signed)
[name] with [facility]/Hemodialysis liaison notified of patient presentation to ED.

## 2018-02-12 NOTE — Progress Notes (Signed)
   02/12/18 1715  Clinical Encounter Type  Visited With Patient  Visit Type Critical Care  Referral From Nurse  Consult/Referral To Chaplain  Spiritual Encounters  Spiritual Needs Prayer;Emotional   [place] received a RR PG. CH reported to RM and stayed outside of RM and prayed silently. PT was found unresponsive on the toilet. PT was place in bed and was talking to staff when [facility] departed.

## 2018-02-12 NOTE — Progress Notes (Addendum)
Pharmacy Antibiotic Note  [name] is a 57 y.o. female admitted on 02/12/2018 with pneumonia.  Pharmacy has been consulted for levaquin dosing.  Plan: levaquin 500mg  iv q48h after MD ordered levaquin 750mg  iv x 1       Temp (24hrs), Avg:99.5 F (37.5 C), Min:99.5 F (37.5 C), Max:99.5 F (37.5 C)  Recent Labs  Lab 02/12/18 0920  WBC 8.5  CREATININE 7.55*  LATICACIDVEN 0.7    CrCl cannot be calculated (Unknown ideal weight.).    Allergies  Allergen Reactions  . Ace Inhibitors Swelling and Anaphylaxis  . Ativan [Lorazepam] Other (See Comments)    Reaction:Hallucinations and headaches  . Compazine [Prochlorperazine Edisylate] Anaphylaxis, Nausea And Vomiting and Other (See Comments)    Other reaction(s): dystonia from this vs. Reglan, 23 Jul - patient relates that she takes promethazine frequently with no problems  . Sumatriptan Succinate Other (See Comments)    Other reaction(s): delirium and hallucinations per [facility] records  . Dilaudid [Hydromorphone Hcl] Other (See Comments)    Delirium   . Ondansetron Other (See Comments)    hallucinations    . Zofran [Ondansetron Hcl] Other (See Comments)    Reaction:  hallucinations   . Codeine Nausea And Vomiting  . Gabapentin Other (See Comments)    Reaction:  Unknown    . Lac Bovis Nausea And Vomiting  . Losartan Nausea Only  . Oxycodone Anxiety  . Prochlorperazine Other (See Comments)    Reaction:  Unknown . Patient does not remember reaction but she does have vertigo and anxiety along with n and v at times. Could be used to treat any of these   . Reglan [Metoclopramide] Other (See Comments)    Per patient her Dr. Evelina Bucy her off it   . Scopolamine Other (See Comments)    Dizziness, also has vertigo already  . Tape Rash  . Tapentadol Rash    Antimicrobials this admission: Anti-infectives (From admission, onward)   Start     Dose/Rate Route Frequency Ordered Stop   02/14/18 1400  Levofloxacin (LEVAQUIN) IVPB 250 mg   Status:  Discontinued     250 mg 50 mL/hr over 60 Minutes Intravenous Every 48 hours 02/12/18 1328 02/12/18 1329   02/14/18 1400  levofloxacin (LEVAQUIN) IVPB 500 mg     500 mg 100 mL/hr over 60 Minutes Intravenous Every 48 hours 02/12/18 1329     02/12/18 1315  rifaximin (XIFAXAN) tablet 550 mg     550 mg Oral 2 times daily 02/12/18 1312     02/12/18 1000  levofloxacin (LEVAQUIN) IVPB 750 mg     750 mg 100 mL/hr over 90 Minutes Intravenous  Once 02/12/18 0945 02/12/18 1151       Microbiology results: No results found for this or any previous visit (from the past 240 hour(s)).  Thank you for allowing pharmacy to be a part of this patient's care.  [name] 02/12/2018 1:29 PM

## 2018-02-12 NOTE — ED Notes (Signed)
Unable to get second set of blood cultures. Dr. Jimmye Norman notified and ok antibiotics.

## 2018-02-13 LAB — BASIC METABOLIC PANEL
Anion gap: 11 (ref 5–15)
BUN: 29 mg/dL — ABNORMAL HIGH (ref 6–20)
CO2: 28 mmol/L (ref 22–32)
Calcium: 7.8 mg/dL — ABNORMAL LOW (ref 8.9–10.3)
Chloride: 96 mmol/L — ABNORMAL LOW (ref 101–111)
Creatinine, Ser: 5.99 mg/dL — ABNORMAL HIGH (ref 0.44–1.00)
GFR calc Af Amer: 8 mL/min — ABNORMAL LOW (ref >60)
GFR calc non Af Amer: 7 mL/min — ABNORMAL LOW (ref >60)
Glucose, Bld: 83 mg/dL (ref 65–99)
Potassium: 4.4 mmol/L (ref 3.5–5.1)
Sodium: 135 mmol/L (ref 135–145)

## 2018-02-13 LAB — EXPECTORATED SPUTUM ASSESSMENT W GRAM STAIN, RFLX TO RESP C: Special Requests: NORMAL

## 2018-02-13 LAB — CBC
HCT: 28.3 % — ABNORMAL LOW (ref 35.0–47.0)
Hemoglobin: 9.3 g/dL — ABNORMAL LOW (ref 12.0–16.0)
MCH: 30.6 pg (ref 26.0–34.0)
MCHC: 33 g/dL (ref 32.0–36.0)
MCV: 92.9 fL (ref 80.0–100.0)
Platelets: 141 10*3/uL — ABNORMAL LOW (ref 150–440)
RBC: 3.05 MIL/uL — ABNORMAL LOW (ref 3.80–5.20)
RDW: 18.3 % — ABNORMAL HIGH (ref 11.5–14.5)
WBC: 6.8 10*3/uL (ref 3.6–11.0)

## 2018-02-13 LAB — GLUCOSE, CAPILLARY
Glucose-Capillary: 72 mg/dL (ref 65–99)
Glucose-Capillary: 66 mg/dL (ref 65–99)
Glucose-Capillary: 71 mg/dL (ref 65–99)
Glucose-Capillary: 115 mg/dL — ABNORMAL HIGH (ref 65–99)
Glucose-Capillary: 118 mg/dL — ABNORMAL HIGH (ref 65–99)

## 2018-02-13 MED ORDER — PROMETHAZINE HCL 25 MG/ML IJ SOLN
12.5000 mg | Freq: Four times a day (QID) | INTRAMUSCULAR | Status: DC | PRN
  Administered 2018-02-13 – 2018-02-15 (×3): 12.5 mg via INTRAVENOUS
  Filled 2018-02-13 (×3): qty 1

## 2018-02-13 MED ORDER — GUAIFENESIN-DM 100-10 MG/5ML PO SYRP
5.0000 mL | ORAL_SOLUTION | ORAL | Status: DC | PRN
  Administered 2018-02-13 – 2018-02-14 (×5): 5 mL via ORAL
  Filled 2018-02-13 (×5): qty 5

## 2018-02-13 MED ORDER — HYDROCODONE-ACETAMINOPHEN 5-325 MG PO TABS
1.0000 | ORAL_TABLET | Freq: Four times a day (QID) | ORAL | Status: DC | PRN
  Administered 2018-02-13 – 2018-02-14 (×3): 1 via ORAL
  Filled 2018-02-13 (×3): qty 1

## 2018-02-13 MED ORDER — LOPERAMIDE HCL 2 MG PO CAPS
2.0000 mg | ORAL_CAPSULE | ORAL | Status: DC | PRN
  Administered 2018-02-13: 11:00:00 2 mg via ORAL
  Filled 2018-02-13: qty 1

## 2018-02-13 MED ORDER — INSULIN DETEMIR 100 UNIT/ML ~~LOC~~ SOLN
2.0000 [IU] | Freq: Every day | SUBCUTANEOUS | Status: DC
  Administered 2018-02-13: 2 [IU] via SUBCUTANEOUS
  Filled 2018-02-13 (×2): qty 0.02

## 2018-02-13 NOTE — Progress Notes (Signed)
[place] [facility] [place] NAME: [name]    MR#:  [id number]  DATE OF BIRTH:  11-26-1960  SUBJECTIVE: She is admitted for pneumonia, having cough, no she complains of headache.  Patient passed out inthe bathroom yesterday.  So rapid response was called, vitals were stable, had episode of hypoglycemia.  CHIEF COMPLAINT:   Chief Complaint  Patient presents with  . Weakness    REVIEW OF SYSTEMS:   ROS CONSTITUTIONAL: Low-grade temperature, feels generalized weakness, headache.  EYES: No blurred or double vision.  EARS, NOSE, AND THROAT: No tinnitus or ear pain.  RESPIRATORY: Nhas some cough, pleuritic chest pain.  CARDIOVASCULAR: No chest pain, orthopnea, edema.  GASTROINTESTINAL: No nausea, vomiting, diarrhea or abdominal pain.  GENITOURINARY: No dysuria, hematuria.  ENDOCRINE: No polyuria, nocturia,  HEMATOLOGY: No anemia, easy bruising or bleeding SKIN: No rash or lesion. MUSCULOSKELETAL: No joint pain or arthritis.   NEUROLOGIC: No tingling, numbness, weakness.  PSYCHIATRY: No anxiety or depression.   DRUG ALLERGIES:   Allergies  Allergen Reactions  . Ace Inhibitors Swelling and Anaphylaxis  . Ativan [Lorazepam] Other (See Comments)    Reaction:Hallucinations and headaches  . Compazine [Prochlorperazine Edisylate] Anaphylaxis, Nausea And Vomiting and Other (See Comments)    Other reaction(s): dystonia from this vs. Reglan, 23 Jul - patient relates that she takes promethazine frequently with no problems  . Sumatriptan Succinate Other (See Comments)    Other reaction(s): delirium and hallucinations per [facility] records  . Dilaudid [Hydromorphone Hcl] Other (See Comments)    Delirium   . Ondansetron Other (See Comments)    hallucinations    . Zofran [Ondansetron Hcl] Other (See Comments)    Reaction:  hallucinations   . Codeine Nausea And Vomiting  . Gabapentin Other (See Comments)    Reaction:  Unknown    . Lac Bovis  Nausea And Vomiting  . Losartan Nausea Only  . Oxycodone Anxiety  . Prochlorperazine Other (See Comments)    Reaction:  Unknown . Patient does not remember reaction but she does have vertigo and anxiety along with n and v at times. Could be used to treat any of these   . Reglan [Metoclopramide] Other (See Comments)    Per patient her Dr. Evelina Bucy her off it   . Scopolamine Other (See Comments)    Dizziness, also has vertigo already  . Tape Rash  . Tapentadol Rash    VITALS:  Blood pressure (!) 125/37, pulse 63, temperature 99.6 F (37.6 C), temperature source Oral, resp. rate 19, height 5' 5.5" (1.664 m), weight 77.8 kg (171 lb 8.3 oz), SpO2 96 %.  PHYSICAL EXAMINATION:  GENERAL:  57 y.o.-year-old patient lying in the bed with no acute distress.  EYES: Pupils equal, round, reactive to light and accommodation. No scleral icterus. Extraocular muscles intact.  HEENT: Head atraumatic, normocephalic. Oropharynx and nasopharynx clear.  NECK:  Supple, no jugular venous distention. No thyroid enlargement, no tenderness.  LUNGS: Normal breath sounds bilaterally, no wheezing, rales,rhonchi or crepitation. No use of accessory muscles of respiration.  CARDIOVASCULAR: S1, S2 normal. No murmurs, rubs, or gallops.  ABDOMEN: Soft, nontender, nondistended. Bowel sounds present. No organomegaly or mass.  EXTREMITIES: No pedal edema, cyanosis, or clubbing.  NEUROLOGIC: Cranial nerves II through XII are intact. Muscle strength 5/5 in all extremities. Sensation intact. Gait not checked.  PSYCHIATRIC: The patient is alert and oriented x 3.  SKIN: No obvious rash, lesion, or ulcer.    LABORATORY PANEL:  CBC Recent Labs  Lab 02/13/18 0523  WBC 6.8  HGB 9.3*  HCT 28.3*  PLT 141*   ------------------------------------------------------------------------------------------------------------------  Chemistries  Recent Labs  Lab 02/12/18 0920 02/13/18 0523  NA 135 135  K 4.6 4.4  CL 95* 96*  CO2  27 28  GLUCOSE 69 83  BUN 41* 29*  CREATININE 7.55* 5.99*  CALCIUM 7.9* 7.8*  AST 21  --   ALT 11*  --   ALKPHOS 100  --   BILITOT 0.6  --    ------------------------------------------------------------------------------------------------------------------  Cardiac Enzymes Recent Labs  Lab 02/12/18 0920  TROPONINI 0.03*   ------------------------------------------------------------------------------------------------------------------  RADIOLOGY:  Dg Chest 2 View  Result Date: 02/12/2018 CLINICAL DATA:  Cough and shortness of breath EXAM: CHEST - 2 VIEW COMPARISON:  01/17/2018 FINDINGS: Cardiac shadow is stable. Diffuse venous stenting is noted in the left upper extremity related to dialysis shunt. Lungs are well aerated bilaterally. Patchy bibasilar infiltrates are noted left greater than right new from the prior exam. No sizable effusion is seen. IMPRESSION: New bibasilar patchy infiltrates. Electronically Signed   By: Inez Catalina M.D.   On: 02/12/2018 09:29    EKG:   Orders placed or performed during the hospital encounter of 02/12/18  . ED EKG 12-Lead  . ED EKG 12-Lead  . EKG 12-Lead  . EKG 12-Lead    ASSESSMENT AND PLAN:   57 year old female patient admitted for community-acquired pneumonia.  Continue Levaquin, patient on oxygen 3 L, saturation 96%.  Still on oxygen so wean off oxygen as tolerated. Patient does have low-grade temperature 100.4 F last night.  Watch closely, continue Levaquin as per pharmacy to the patient on the dialysis.    #2 ESRD on hemodialysis, patient got her dialysis yesterday. 3.near r syncopal event yesterday.,  Likely secondary to overall deconditioning secondary to multiple medical problems, and now with pneumonia. #4 hypoglycemia: Patient is on Lantus at low-dose but I decreased further. 5.  History of CAD, continue aspirin, Plavix, statins, #6 deconditioning: Physical therapy consult. All .the records are reviewed and case discussed with  Care Management/Social Workerr. Management plans discussed with the patient, family and they are in agreement.  CODE STATUS:full  TOTAL TIME TAKING CARE OF THIS PATIENT: 35 minutes.   POSSIBLE D/C IN 1-2DAYS, DEPENDING ON CLINICAL CONDITION.   Epifanio Lesches M.D on 02/13/2018 at 8:22 AM  Between 7am to 6pm - Pager - [phone]  After 6pm go to [email or web address] - password [email or web address] [facility]  [facility]  Office  [phone]  CC: Primary care physician; Freddy Finner, NP   Note: This dictation was prepared with Dragon dictation along with smaller phrase technology. Any transcriptional errors that result from this process are unintentional.

## 2018-02-13 NOTE — Progress Notes (Signed)
Pre dialysis assessment 

## 2018-02-13 NOTE — Progress Notes (Signed)
This note also relates to the following rows which could not be included: Pulse Rate - Cannot attach notes to unvalidated device data Resp - Cannot attach notes to unvalidated device data BP - Cannot attach notes to unvalidated device data  Hd started  

## 2018-02-13 NOTE — Progress Notes (Signed)
[name] MRN: [id number] DOB: 15-Jun-1961   Cancelled Treatment:    Reason Eval/Treat Not Completed: Patient declined PT services x 2 this date secondary to "not feeling well", nursing notified.  Will attempt to see pt at a future date as medically appropriate.    Linus Salmons PT, DPT 02/13/18, 10:02 AM

## 2018-02-13 NOTE — Progress Notes (Signed)
[facility], [place] 02/13/18  Subjective:   Patient known to our practice from outpatient dialysis and previous admissions.  At this time she presents for cough, chest pain, fever. Chest x-ray shows new bibasilar patchy infiltrates.  Patient is admitted for evaluation and management of pneumonia  Had chills at dialysis yesterday and required termination of HD treatment early Has wet sounding cough today    Objective:  Vital signs in last 24 hours:  Temp:  [98 F (36.7 C)-100.7 F (38.2 C)] 98 F (36.7 C) (05/25 0851) Pulse Rate:  [56-82] 82 (05/25 0851) Resp:  [13-28] 19 (05/25 0834) BP: (103-143)/(37-62) 103/62 (05/25 0851) SpO2:  [94 %-100 %] 98 % (05/25 0834) Weight:  [77.8 kg (171 lb 8.3 oz)] 77.8 kg (171 lb 8.3 oz) (05/24 1512)  Weight change:  Filed Weights   02/12/18 1512  Weight: 77.8 kg (171 lb 8.3 oz)    Intake/Output:    Intake/Output Summary (Last 24 hours) at 02/13/2018 0946 Last data filed at 02/13/2018 0300 Gross per 24 hour  Intake 120 ml  Output 311 ml  Net -191 ml     Physical Exam: General: Sitting up in bed, eating breakfast and talking on phone  HEENT  moist oral mucous membranes  Neck  supple  Pulm/lungs  normal breathing effort, bibasilar crackles  CVS/Heart  regular, no rub or gallop  Abdomen:   Soft, nontender  Extremities:  No edema  Neurologic:  Lethargic but able to answer questions  Skin:  No acute rashes  Access:  Left upper extremity AVG       Basic Metabolic Panel:  Recent Labs  Lab 02/12/18 0920 02/12/18 1900 02/13/18 0523  NA 135  --  135  K 4.6  --  4.4  CL 95*  --  96*  CO2 27  --  28  GLUCOSE 69  --  83  BUN 41*  --  29*  CREATININE 7.55*  --  5.99*  CALCIUM 7.9*  --  7.8*  PHOS  --  3.7  --      CBC: Recent Labs  Lab 02/12/18 0920 02/13/18 0523  WBC 8.5 6.8  NEUTROABS 6.1  --   HGB 9.9* 9.3*  HCT 30.1* 28.3*  MCV 93.7 92.9  PLT 168 141*      Lab Results  Component  Value Date   HEPBSAG Negative 05/04/2017   HEPBIGM  06/12/2010    NEGATIVE (NOTE) High levels of Hepatitis B Core IgM antibody are detectable during the acute stage of Hepatitis B. This antibody is used to differentiate current from past HBV infection.       Microbiology:  No results found for this or any previous visit (from the past 240 hour(s)).  Coagulation Studies: No results for input(s): LABPROT, INR in the last 72 hours.  Urinalysis: No results for input(s): COLORURINE, LABSPEC, PHURINE, GLUCOSEU, HGBUR, BILIRUBINUR, KETONESUR, PROTEINUR, UROBILINOGEN, NITRITE, LEUKOCYTESUR in the last 72 hours.  Invalid input(s): APPERANCEUR    Imaging: Dg Chest 2 View  Result Date: 02/12/2018 CLINICAL DATA:  Cough and shortness of breath EXAM: CHEST - 2 VIEW COMPARISON:  01/17/2018 FINDINGS: Cardiac shadow is stable. Diffuse venous stenting is noted in the left upper extremity related to dialysis shunt. Lungs are well aerated bilaterally. Patchy bibasilar infiltrates are noted left greater than right new from the prior exam. No sizable effusion is seen. IMPRESSION: New bibasilar patchy infiltrates. Electronically Signed   By: Inez Catalina M.D.   On: 02/12/2018  09:29     Medications:   . [START ON 02/14/2018] levofloxacin (LEVAQUIN) IV     . acidophilus  1 capsule Oral Daily  . aspirin EC  81 mg Oral Daily  . atorvastatin  20 mg Oral QPM  . carvedilol  25 mg Oral BID WC  . Chlorhexidine Gluconate Cloth  6 each Topical Q0600  . clopidogrel  75 mg Oral Daily  . epoetin (EPOGEN/PROCRIT) injection  4,000 Units Intravenous Q M,W,F-HD  . famotidine  20 mg Oral Daily  . FLUoxetine  20 mg Oral QHS  . heparin injection (subcutaneous)  5,000 Units Subcutaneous Q8H  . insulin aspart  0-9 Units Subcutaneous TID WC  . insulin detemir  2 Units Subcutaneous QHS  . lipase/protease/amylase  36,000 Units Oral TID AC  . pantoprazole  40 mg Oral Daily  . rifaximin  550 mg Oral BID  . sevelamer  carbonate  2,400 mg Oral TID WC  . sevelamer carbonate  800 mg Oral BID BM  . cyanocobalamin  1,000 mcg Oral Daily   albuterol, bisacodyl, guaiFENesin-dextromethorphan, HYDROcodone-acetaminophen, promethazine, traZODone  Assessment/ Plan:  57 y.o. female with diabetes, hypertension, hyperlipidemia, diabetic retinopathy, diabetic neuropathy, diabetic gastroparesis, pancreatic insufficiency, depression, GERD. Renal masses, M-spike  5/24: Admitted under observation for cough, sputum production and pneumonia  MWF [facility], left arm AVG  1. ESRD -Extra Hemodialysis today as it was cut short last night Orders have been prepared  2.  Anemia of chronic kidney disease Erythropoietin with hemodialysis  3.  Secondary hyperparathyroidism Currently on sevelamer to be given with meals Decrease dose to 1 tab w/meals while in hospital  4.  Pneumonia Antibiotics as per hospitalist team Blood culture drawn and Given Vancomycin with HD last night      LOS: 0 Lashonta Pilling Candiss Norse 5/25/20199:46 AM  [place], [place]  Note: This note was prepared with Dragon dictation. Any transcription errors are unintentional

## 2018-02-13 NOTE — Plan of Care (Signed)
  Problem: Elimination: Goal: Will not experience complications related to urinary retention Outcome: Progressing  Pt had Hemodialysis this shift; 1 liter removed per HD RN report Problem: Pain Managment: Goal: General experience of comfort will improve Outcome: Progressing  PRN Norco given for c/o headache with relief this shift Problem: Nutrition: Goal: Adequate nutrition will be maintained Outcome: Not Progressing  Poor PO intake this shift

## 2018-02-13 NOTE — Progress Notes (Signed)
Hd completed 

## 2018-02-13 NOTE — Care Management Obs Status (Signed)
[place] NOTIFICATION   Patient Details  Name: [name] MRN: [id number] Date of Birth: [birth year]   Medicare Observation Status Notification Given:  Yes    Jolly Mango, RN 02/13/2018, 11:33 AM

## 2018-02-14 DIAGNOSIS — Z992 Dependence on renal dialysis: Secondary | ICD-10-CM | POA: Diagnosis not present

## 2018-02-14 DIAGNOSIS — D631 Anemia in chronic kidney disease: Secondary | ICD-10-CM | POA: Diagnosis present

## 2018-02-14 DIAGNOSIS — E1122 Type 2 diabetes mellitus with diabetic chronic kidney disease: Secondary | ICD-10-CM | POA: Diagnosis present

## 2018-02-14 DIAGNOSIS — I132 Hypertensive heart and chronic kidney disease with heart failure and with stage 5 chronic kidney disease, or end stage renal disease: Secondary | ICD-10-CM | POA: Diagnosis present

## 2018-02-14 DIAGNOSIS — Z89412 Acquired absence of left great toe: Secondary | ICD-10-CM | POA: Diagnosis not present

## 2018-02-14 DIAGNOSIS — Z91048 Other nonmedicinal substance allergy status: Secondary | ICD-10-CM | POA: Diagnosis not present

## 2018-02-14 DIAGNOSIS — Z90710 Acquired absence of both cervix and uterus: Secondary | ICD-10-CM | POA: Diagnosis not present

## 2018-02-14 DIAGNOSIS — H9190 Unspecified hearing loss, unspecified ear: Secondary | ICD-10-CM | POA: Diagnosis present

## 2018-02-14 DIAGNOSIS — E11649 Type 2 diabetes mellitus with hypoglycemia without coma: Secondary | ICD-10-CM | POA: Diagnosis not present

## 2018-02-14 DIAGNOSIS — N2581 Secondary hyperparathyroidism of renal origin: Secondary | ICD-10-CM | POA: Diagnosis present

## 2018-02-14 DIAGNOSIS — I2510 Atherosclerotic heart disease of native coronary artery without angina pectoris: Secondary | ICD-10-CM | POA: Diagnosis present

## 2018-02-14 DIAGNOSIS — Z9841 Cataract extraction status, right eye: Secondary | ICD-10-CM | POA: Diagnosis not present

## 2018-02-14 DIAGNOSIS — N186 End stage renal disease: Secondary | ICD-10-CM | POA: Diagnosis present

## 2018-02-14 DIAGNOSIS — K219 Gastro-esophageal reflux disease without esophagitis: Secondary | ICD-10-CM | POA: Diagnosis present

## 2018-02-14 DIAGNOSIS — K861 Other chronic pancreatitis: Secondary | ICD-10-CM | POA: Diagnosis present

## 2018-02-14 DIAGNOSIS — Z885 Allergy status to narcotic agent status: Secondary | ICD-10-CM | POA: Diagnosis not present

## 2018-02-14 DIAGNOSIS — F329 Major depressive disorder, single episode, unspecified: Secondary | ICD-10-CM | POA: Diagnosis present

## 2018-02-14 DIAGNOSIS — Z9842 Cataract extraction status, left eye: Secondary | ICD-10-CM | POA: Diagnosis not present

## 2018-02-14 DIAGNOSIS — I5032 Chronic diastolic (congestive) heart failure: Secondary | ICD-10-CM | POA: Diagnosis present

## 2018-02-14 DIAGNOSIS — J159 Unspecified bacterial pneumonia: Secondary | ICD-10-CM | POA: Diagnosis present

## 2018-02-14 DIAGNOSIS — J440 Chronic obstructive pulmonary disease with acute lower respiratory infection: Secondary | ICD-10-CM | POA: Diagnosis present

## 2018-02-14 DIAGNOSIS — I252 Old myocardial infarction: Secondary | ICD-10-CM | POA: Diagnosis not present

## 2018-02-14 DIAGNOSIS — Z85528 Personal history of other malignant neoplasm of kidney: Secondary | ICD-10-CM | POA: Diagnosis not present

## 2018-02-14 DIAGNOSIS — E11319 Type 2 diabetes mellitus with unspecified diabetic retinopathy without macular edema: Secondary | ICD-10-CM | POA: Diagnosis present

## 2018-02-14 LAB — GLUCOSE, CAPILLARY
Glucose-Capillary: 48 mg/dL — ABNORMAL LOW (ref 65–99)
Glucose-Capillary: 55 mg/dL — ABNORMAL LOW (ref 65–99)
Glucose-Capillary: 64 mg/dL — ABNORMAL LOW (ref 65–99)
Glucose-Capillary: 84 mg/dL (ref 65–99)
Glucose-Capillary: 142 mg/dL — ABNORMAL HIGH (ref 65–99)
Glucose-Capillary: 92 mg/dL (ref 65–99)
Glucose-Capillary: 77 mg/dL (ref 65–99)

## 2018-02-14 MED ORDER — VANCOMYCIN HCL IN DEXTROSE 1-5 GM/200ML-% IV SOLN
1000.0000 mg | Freq: Once | INTRAVENOUS | Status: AC
  Administered 2018-02-14: 14:00:00 1000 mg via INTRAVENOUS
  Filled 2018-02-14: qty 200

## 2018-02-14 MED ORDER — LEVOFLOXACIN 500 MG PO TABS
500.0000 mg | ORAL_TABLET | ORAL | Status: DC
  Administered 2018-02-14: 500 mg via ORAL
  Filled 2018-02-14: qty 1

## 2018-02-14 MED ORDER — IPRATROPIUM-ALBUTEROL 0.5-2.5 (3) MG/3ML IN SOLN
3.0000 mL | RESPIRATORY_TRACT | Status: DC
  Administered 2018-02-14 (×3): 3 mL via RESPIRATORY_TRACT
  Filled 2018-02-14 (×3): qty 3

## 2018-02-14 MED ORDER — HYDROCOD POLST-CPM POLST ER 10-8 MG/5ML PO SUER
5.0000 mL | Freq: Two times a day (BID) | ORAL | Status: DC
  Administered 2018-02-14 – 2018-02-15 (×3): 5 mL via ORAL
  Filled 2018-02-14 (×3): qty 5

## 2018-02-14 MED ORDER — VANCOMYCIN HCL IN DEXTROSE 750-5 MG/150ML-% IV SOLN
750.0000 mg | INTRAVENOUS | Status: DC
  Filled 2018-02-14: qty 150

## 2018-02-14 NOTE — Evaluation (Signed)
Physical Therapy Evaluation Patient Details Name: [name] MRN: [id number] DOB: [birth year] Today's Date: 02/14/2018   History of Present Illness  [name] is a 57yo black female, comes to [facility] with weakness and cough, admitted with PNA. Pt had a syncopal event on 5/24 found by NA on toilet, and low BG on 5/25.   Clinical Impression  Pt admitted with above diagnosis. Pt currently with functional limitations due to the deficits listed below (see "PT Problem List"). Upon entry, the patient is received in bed, no family/caregiver present. The pt is awake and reluctantly agreeable to participate as she feels "like crap". Pt appears generally malaised, and all attempts at clarifying questioning are met with tangential responses at best. Pt's responses are delayed and brief, maintaining a furrowed brow throughout, with  Brief intermittent eye contact. Pt c/o 9/10 HA worse with coughing, and generally upset that she is not feeling better yet. RN reports recent wean to room air, but upon entry SpO2: 85% supine, which drops to 80% s/p transfer to chair. 1L/min nasal canula donned at that time, recovering to 90%. Noted bradycardia from 51 BPM during session, curious for contribution to dizziness upon standing.    Functional mobility assessment demonstrates observed high levels of effort, bradykinesia, long pauses, limited trunk control, and subjective weakness, but supervision level bed mobility and STS transfers with RW without LOB, and a few steps to recliner. Additional AMB no pursued at this time in consideration of pt's malaise and HA. The patient performed these at a higher level of independence PTA. Pt does not recall prior admission this month until her procedure is detailed, and then she recalls it as an outpatient procedure, which it was not. She has no memory of her PT evaluation on 01/20/2018, hence her reports of not receiving any PT services at [facility] last admission is unreliable. Pt does reports a quick  'bounce back' to PLOF however.     Empirically, the patient demonstrates increased risk of recurrent falls AEB gait speed <0.50ms, forward reach <5", and multiple LOB demonstrated throughout session. Pt also noted to have syncope/near syncope on 5/24 while admitted. Pt will benefit from skilled PT intervention to increase independence and safety with basic mobility in preparation for discharge to the venue listed below.       Follow Up Recommendations Home health PT;Supervision/Assistance - 24 hour    Equipment Recommendations  None recommended by PT    Recommendations for Other Services       Precautions / Restrictions Precautions Precautions: Fall      Mobility  Bed Mobility Overal bed mobility: Needs Assistance Bed Mobility: Supine to Sit     Supine to sit: Supervision     General bed mobility comments: appears quite weak, heavy effort and time required  Transfers Overall transfer level: Needs assistance Equipment used: Rolling walker (2 wheeled) Transfers: Sit to/from Stand Sit to Stand: Supervision         General transfer comment: rises well, safe RW use, remains in same spot for 60-90 seconds, reporting some dizziness prior to transfer to chair.   Ambulation/Gait Ambulation/Gait assistance: (not pursued at this time, high NPRS, pt generally feeling poorly. )              Stairs            Wheelchair Mobility    Modified Rankin (Stroke Patients Only)       Balance Overall balance assessment: Modified Independent  Pertinent Vitals/Pain Pain Assessment: 0-10 Pain Score: 9  Pain Location: HA Pain Descriptors / Indicators: Aching Pain Intervention(s): Limited activity within patient's tolerance;Monitored during session    Phil Campbell expects to be discharged to:: Private residence Living Arrangements: (Lives with 2 sisters) Available Help at Discharge:  Family Type of Home: House Home Access: Ramped entrance     Home Layout: One level Home Equipment: Environmental consultant - 2 wheels;Cane - single point Additional Comments: SPC for community distances, no recent RW use     Prior Function Level of Independence: Independent with assistive device(s)         Comments: occasionally does not use AD, however majority of time uses [facility] for home distances. Does report that she drives      Hand Dominance        Extremity/Trunk Assessment                Communication   Communication: No difficulties  Cognition Arousal/Alertness: (drowsy, sleepy ) Behavior During Therapy: WFL for tasks assessed/performed;Agitated Overall Cognitive Status: Difficult to assess                                        General Comments      Exercises     Assessment/Plan    PT Assessment Patient needs continued PT services  PT Problem List Decreased activity tolerance;Decreased strength;Decreased mobility;Decreased safety awareness       PT Treatment Interventions Functional mobility training;Therapeutic activities;Therapeutic exercise;Patient/family education    PT Goals (Current goals can be found in the Care Plan section)  Acute Rehab PT Goals Patient Stated Goal: resolve HA and cough PT Goal Formulation: With patient Time For Goal Achievement: 02/21/18 Potential to Achieve Goals: Good    Frequency Min 2X/week   Barriers to discharge        Co-evaluation               AM-PAC PT "6 Clicks" Daily Activity  Outcome Measure Difficulty turning over in bed (including adjusting bedclothes, sheets and blankets)?: A Lot Difficulty moving from lying on back to sitting on the side of the bed? : A Lot Difficulty sitting down on and standing up from a chair with arms (e.g., wheelchair, bedside commode, etc,.)?: A Lot Help needed moving to and from a bed to chair (including a wheelchair)?: A Lot Help needed walking in hospital  room?: A Lot Help needed climbing 3-5 steps with a railing? : A Lot 6 Click Score: 12    End of Session Equipment Utilized During Treatment: Oxygen Activity Tolerance: Patient tolerated treatment well;Patient limited by pain Patient left: in chair;with call bell/phone within reach Nurse Communication: (desaturation) PT Visit Diagnosis: Muscle weakness (generalized) (M62.81);Difficulty in walking, not elsewhere classified (R26.2);Other abnormalities of gait and mobility (R26.89)    Time: 7517-0017 PT Time Calculation (min) (ACUTE ONLY): 22 min   Charges:   PT Evaluation $PT Eval High Complexity: 1 High     PT G Codes:        11:57 AM, February 24, 2018 Etta Grandchild, PT, DPT Physical Therapist - [facility]  [phone] ([place])     Amesha Bailey C 2018/02/24, 11:47 AM

## 2018-02-14 NOTE — Plan of Care (Signed)
  Problem: Clinical Measurements: Goal: Respiratory complications will improve Outcome: Not Progressing  Pt started on scheduled Tussionex PO BID; Duonebs q4 hrs for wheezing and rhonchi; productive thick brown cough; able to wean to room air; pt verbalizes need to wear mask

## 2018-02-14 NOTE — Progress Notes (Signed)
Hypoglycemic Event  CBG: 55  Treatment: [place] Lemon Lime drink and breakfast tray  Symptoms: POCT results only  Follow-up CBG: [id number] CBG Result:84  Possible Reasons for Event: pt ESRD  Comments/MD notified: POCT 55 at 0735; 22 @ 8685; 28 @ 4883 and 54 @ 0845.  Vianne Bulls MD    Bailey Mech Silvestre Moment

## 2018-02-14 NOTE — Plan of Care (Signed)
  Problem: Activity: Goal: Risk for activity intolerance will decrease Outcome: Progressing  PT Evaluation performed this shift; pt oob to chair

## 2018-02-14 NOTE — Progress Notes (Signed)
Pharmacy Antibiotic Note  [name] is a 57 y.o. female admitted on 02/12/2018 with pneumonia.  Pharmacy has been consulted for vancomycin dosing.  Plan: Vancomycin 750mg  IV every dialysis over 1 hours.  Goal trough 15-20 mcg/mL.  Height: 5' 5.5" (166.4 cm) Weight: 168 lb 11.2 oz (76.5 kg) IBW/kg (Calculated) : 58.15  Temp (24hrs), Avg:98.4 F (36.9 C), Min:98.2 F (36.8 C), Max:98.7 F (37.1 C)  Recent Labs  Lab 02/12/18 0920 02/13/18 0523  WBC 8.5 6.8  CREATININE 7.55* 5.99*  LATICACIDVEN 0.7  --     Estimated Creatinine Clearance: 10.6 mL/min (A) (by C-G formula based on SCr of 5.99 mg/dL (H)).    Allergies  Allergen Reactions  . Ace Inhibitors Swelling and Anaphylaxis  . Ativan [Lorazepam] Other (See Comments)    Reaction:Hallucinations and headaches  . Compazine [Prochlorperazine Edisylate] Anaphylaxis, Nausea And Vomiting and Other (See Comments)    Other reaction(s): dystonia from this vs. Reglan, 23 Jul - patient relates that she takes promethazine frequently with no problems  . Sumatriptan Succinate Other (See Comments)    Other reaction(s): delirium and hallucinations per [facility] records  . Dilaudid [Hydromorphone Hcl] Other (See Comments)    Delirium   . Ondansetron Other (See Comments)    hallucinations    . Zofran [Ondansetron Hcl] Other (See Comments)    Reaction:  hallucinations   . Codeine Nausea And Vomiting  . Gabapentin Other (See Comments)    Reaction:  Unknown    . Lac Bovis Nausea And Vomiting  . Losartan Nausea Only  . Oxycodone Anxiety  . Prochlorperazine Other (See Comments)    Reaction:  Unknown . Patient does not remember reaction but she does have vertigo and anxiety along with n and v at times. Could be used to treat any of these   . Reglan [Metoclopramide] Other (See Comments)    Per patient her Dr. Evelina Bucy her off it   . Scopolamine Other (See Comments)    Dizziness, also has vertigo already  . Tape Rash  . Tapentadol Rash     Antimicrobials this admission: Anti-infectives (From admission, onward)   Start     Dose/Rate Route Frequency Ordered Stop   02/15/18 1200  vancomycin (VANCOCIN) IVPB 750 mg/150 ml premix     750 mg 150 mL/hr over 60 Minutes Intravenous Every M-W-F (Hemodialysis) 02/14/18 2113     02/14/18 1800  levofloxacin (LEVAQUIN) tablet 500 mg     500 mg Oral Every 48 hours 02/14/18 1031     02/14/18 1400  Levofloxacin (LEVAQUIN) IVPB 250 mg  Status:  Discontinued     250 mg 50 mL/hr over 60 Minutes Intravenous Every 48 hours 02/12/18 1328 02/12/18 1329   02/14/18 1400  levofloxacin (LEVAQUIN) IVPB 500 mg  Status:  Discontinued     500 mg 100 mL/hr over 60 Minutes Intravenous Every 48 hours 02/12/18 1329 02/14/18 1031   02/14/18 1230  vancomycin (VANCOCIN) IVPB 1000 mg/200 mL premix     1,000 mg 200 mL/hr over 60 Minutes Intravenous  Once 02/14/18 1217 02/14/18 1448   02/12/18 2045  vancomycin (VANCOCIN) IVPB 1000 mg/200 mL premix     1,000 mg 200 mL/hr over 60 Minutes Intravenous  Once 02/12/18 2043 02/12/18 2310   02/12/18 1315  rifaximin (XIFAXAN) tablet 550 mg     550 mg Oral 2 times daily 02/12/18 1312     02/12/18 1000  levofloxacin (LEVAQUIN) IVPB 750 mg     750 mg 100 mL/hr over  90 Minutes Intravenous  Once 02/12/18 0945 02/12/18 1151       Microbiology results: Recent Results (from the past 240 hour(s))  Blood Culture (routine x 2)     Status: None (Preliminary result)   Collection Time: 02/12/18  9:20 AM  Result Value Ref Range Status   Specimen Description BLOOD R H  Final   Special Requests   Final    BOTTLES DRAWN AEROBIC AND ANAEROBIC Blood Culture adequate volume   Culture   Final    NO GROWTH 2 DAYS Performed at [facility], [address] [place], [place] [address]    Report Status PENDING  Incomplete  Blood Culture (routine x 2)     Status: None (Preliminary result)   Collection Time: 02/12/18  6:50 PM  Result Value Ref Range Status   Specimen  Description BLOOD HEMODIALYSIS FISTULA  Final   Special Requests   Final    BOTTLES DRAWN AEROBIC AND ANAEROBIC Blood Culture adequate volume   Culture   Final    NO GROWTH 2 DAYS Performed at [facility], [address] [place], [place] [address]    Report Status PENDING  Incomplete  Culture, expectorated sputum-assessment     Status: None   Collection Time: 02/13/18  4:41 PM  Result Value Ref Range Status   Specimen Description EXPECTORATED SPUTUM  Final   Special Requests Normal  Final   Sputum evaluation   Final    THIS SPECIMEN IS ACCEPTABLE FOR SPUTUM CULTURE Performed at [facility], [address], [place], [place] [address]    Report Status 02/13/2018 FINAL  Final  Culture, respiratory (NON-Expectorated)     Status: None (Preliminary result)   Collection Time: 02/13/18  4:41 PM  Result Value Ref Range Status   Specimen Description   Final    EXPECTORATED SPUTUM Performed at [facility], [address], [place], [place] [address]    Special Requests   Final    Normal Reflexed from [id number] Performed at [facility], [place], [place] [address]    Gram Stain   Final    NO WBC SEEN RARE SQUAMOUS EPITHELIAL CELLS PRESENT RARE BUDDING YEAST SEEN RARE GRAM POSITIVE COCCI IN PAIRS Performed at [facility], [place] [address], [place], [place] [address]    Culture PENDING  Incomplete   Report Status PENDING  Incomplete     Thank you for allowing pharmacy to be a part of this patient's care.  Donna Christen Coner Gibbard 02/14/2018 9:14 PM

## 2018-02-14 NOTE — Progress Notes (Signed)
[facility] NAME: [name]    MR#:  [id number]  DATE OF BIRTH:  Sep 08, 1961  SUBJECTIVE more cough today  CHIEF COMPLAINT:   Admitted for pneumonia, on Levaquin, hypoxic with sats 92% on room air but overall she appears better today.  No fever.  Patient had chills at dialysis day before that so she received a dose of vancomycin.  REVIEW OF SYSTEMS:   ROS CONSTITUTIONAL EYES: No blurred or double vision.  Generalized weakness, cough. EARS, NOSE, AND THROAT: No tinnitus or ear pain.  RESPIRATORY: Nhas some cough, pleuritic chest pain.  CARDIOVASCULAR: No chest pain, orthopnea, edema.  GASTROINTESTINAL: No nausea, vomiting, diarrhea or abdominal pain.  GENITOURINARY: No dysuria, hematuria.  ENDOCRINE: No polyuria, nocturia,  HEMATOLOGY: No anemia, easy bruising or bleeding SKIN: No rash or lesion. MUSCULOSKELETAL: No joint pain or arthritis.   NEUROLOGIC: No tingling, numbness, weakness.  PSYCHIATRY: No anxiety or depression.   DRUG ALLERGIES:   Allergies  Allergen Reactions  . Ace Inhibitors Swelling and Anaphylaxis  . Ativan [Lorazepam] Other (See Comments)    Reaction:Hallucinations and headaches  . Compazine [Prochlorperazine Edisylate] Anaphylaxis, Nausea And Vomiting and Other (See Comments)    Other reaction(s): dystonia from this vs. Reglan, 23 Jul - patient relates that she takes promethazine frequently with no problems  . Sumatriptan Succinate Other (See Comments)    Other reaction(s): delirium and hallucinations per [facility] records  . Dilaudid [Hydromorphone Hcl] Other (See Comments)    Delirium   . Ondansetron Other (See Comments)    hallucinations    . Zofran [Ondansetron Hcl] Other (See Comments)    Reaction:  hallucinations   . Codeine Nausea And Vomiting  . Gabapentin Other (See Comments)    Reaction:  Unknown    . Lac Bovis Nausea And Vomiting  . Losartan Nausea Only  . Oxycodone Anxiety  .  Prochlorperazine Other (See Comments)    Reaction:  Unknown . Patient does not remember reaction but she does have vertigo and anxiety along with n and v at times. Could be used to treat any of these   . Reglan [Metoclopramide] Other (See Comments)    Per patient her Dr. Evelina Bucy her off it   . Scopolamine Other (See Comments)    Dizziness, also has vertigo already  . Tape Rash  . Tapentadol Rash    VITALS:  Blood pressure (!) 116/40, pulse (!) 51, temperature 98.2 F (36.8 C), temperature source Oral, resp. rate 20, height 5' 5.5" (1.664 m), weight 76.5 kg (168 lb 11.2 oz), SpO2 (!) 80 %.  PHYSICAL EXAMINATION:  GENERAL:  57 y.o.-year-old patient lying in the bed with no acute distress.  EYES: Pupils equal, round, reactive to light and accommodation. No scleral icterus. Extraocular muscles intact.  HEENT: Head atraumatic, normocephalic. Oropharynx and nasopharynx clear.  NECK:  Supple, no jugular venous distention. No thyroid enlargement, no tenderness.  LUNGS;Bilateral coarse breath sounds  cARDIOVASCULAR: S1, S2 normal. No murmurs, rubs, or gallops.  ABDOMEN: Soft, nontender, nondistended. Bowel sounds present. No organomegaly or mass.  EXTREMITIES: No pedal edema, cyanosis, or clubbing.  NEUROLOGIC: Cranial nerves II through XII are intact. Muscle strength 5/5 in all extremities. Sensation intact. Gait not checked.  PSYCHIATRIC: The patient is alert and oriented x 3.  SKIN: No obvious rash, lesion, or ulcer.    LABORATORY PANEL:   CBC Recent Labs  Lab 02/13/18 0523  WBC 6.8  HGB 9.3*  HCT 28.3*  PLT 141*   ------------------------------------------------------------------------------------------------------------------  Chemistries  Recent Labs  Lab 02/12/18 0920 02/13/18 0523  NA 135 135  K 4.6 4.4  CL 95* 96*  CO2 27 28  GLUCOSE 69 83  BUN 41* 29*  CREATININE 7.55* 5.99*  CALCIUM 7.9* 7.8*  AST 21  --   ALT 11*  --   ALKPHOS 100  --   BILITOT 0.6  --     ------------------------------------------------------------------------------------------------------------------  Cardiac Enzymes Recent Labs  Lab 02/12/18 0920  TROPONINI 0.03*   ------------------------------------------------------------------------------------------------------------------  RADIOLOGY:  No results found.  EKG:   Orders placed or performed during the hospital encounter of 02/12/18  . ED EKG 12-Lead  . ED EKG 12-Lead  . EKG 12-Lead  . EKG 12-Lead    ASSESSMENT AND PLAN:   57 year old female patient admitted for community-acquired pneumonia.  With hypoxia: No further fever, continue oxygen, continue Levaquin, added vancomycin for MRSA cove rege, sputum culture showed gram-positive cocci, budding yeast.  Add bronchodilators for wheezing, continue oxygen,   #2 ESRD on hemodialysis,, Wednesday, Friday, nephrology is following.   3.near syncopal event 2 days ago,  Likely secondary to overall deconditioning secondary to multiple medical problems, and now with pneumonia.  #4. hypoglycemia: Continue Lantus and watch closely   5.  History of CAD, continue aspirin, Plavix, statins,  #6 deconditioning: Physical therapy consulted.   All .the records are reviewed and case discussed with Care Management/Social Workerr. Management plans discussed with the patient, family and they are in agreement.  CODE STATUS:full  TOTAL TIME TAKING CARE OF THIS PATIENT: 35 minutes.   POSSIBLE D/C IN 1-2DAYS, DEPENDING ON CLINICAL CONDITION.   Epifanio Lesches M.D on 02/14/2018 at 11:49 AM  Between 7am to 6pm - Pager - [phone]  After 6pm go to [email or web address] - password [email or web address] [facility]  [facility]  Office  [phone]  CC: Primary care physician; Freddy Finner, NP   Note: This dictation was prepared with Dragon dictation along with smaller phrase technology. Any transcriptional errors that result from this process are unintentional.

## 2018-02-14 NOTE — Progress Notes (Signed)
[facility], [place] 02/14/18  Subjective:   Patient known to our practice from outpatient dialysis and previous admissions.  At this time she presents for cough, chest pain, fever. Chest x-ray shows new bibasilar patchy infiltrates.  Patient is admitted for evaluation and management of pneumonia  Had chills at dialysis on Fridayand required termination of HD treatment early Continues to have wet sounding cough States that she still does not feel well No nausea or vomiting reported   Objective:  Vital signs in last 24 hours:  Temp:  [98.2 F (36.8 C)-99.1 F (37.3 C)] 98.2 F (36.8 C) (05/26 0519) Pulse Rate:  [53-62] 55 (05/26 0519) Resp:  [13-20] 20 (05/26 0519) BP: (116-140)/(40-50) 116/40 (05/26 0519) SpO2:  [93 %-100 %] 93 % (05/26 0835) Weight:  [76.5 kg (168 lb 11.2 oz)-79.8 kg (175 lb 14.8 oz)] 76.5 kg (168 lb 11.2 oz) (05/26 0500)  Weight change: 2 kg (4 lb 6.5 oz) Filed Weights   02/13/18 1200 02/13/18 1500 02/14/18 0500  Weight: 79.8 kg (175 lb 14.8 oz) 77.6 kg (171 lb 1.2 oz) 76.5 kg (168 lb 11.2 oz)    Intake/Output:    Intake/Output Summary (Last 24 hours) at 02/14/2018 1035 Last data filed at 02/14/2018 1013 Gross per 24 hour  Intake 624 ml  Output 1000 ml  Net -376 ml     Physical Exam: General:  Laying in the bed, ill-appearing  HEENT  moist oral mucous membranes  Neck  supple  Pulm/lungs  normal breathing effort, bibasilar crackles  CVS/Heart  regular, no rub or gallop  Abdomen:   Soft, nontender  Extremities:  No edema  Neurologic:  Lethargic but able to answer questions  Skin:  No acute rashes  Access:  Left upper extremity AVG       Basic Metabolic Panel:  Recent Labs  Lab 02/12/18 0920 02/12/18 1900 02/13/18 0523  NA 135  --  135  K 4.6  --  4.4  CL 95*  --  96*  CO2 27  --  28  GLUCOSE 69  --  83  BUN 41*  --  29*  CREATININE 7.55*  --  5.99*  CALCIUM 7.9*  --  7.8*  PHOS  --  3.7  --       CBC: Recent Labs  Lab 02/12/18 0920 02/13/18 0523  WBC 8.5 6.8  NEUTROABS 6.1  --   HGB 9.9* 9.3*  HCT 30.1* 28.3*  MCV 93.7 92.9  PLT 168 141*      Lab Results  Component Value Date   HEPBSAG Negative 05/04/2017   HEPBIGM  06/12/2010    NEGATIVE (NOTE) High levels of Hepatitis B Core IgM antibody are detectable during the acute stage of Hepatitis B. This antibody is used to differentiate current from past HBV infection.       Microbiology:  Recent Results (from the past 240 hour(s))  Blood Culture (routine x 2)     Status: None (Preliminary result)   Collection Time: 02/12/18  9:20 AM  Result Value Ref Range Status   Specimen Description BLOOD R H  Final   Special Requests   Final    BOTTLES DRAWN AEROBIC AND ANAEROBIC Blood Culture adequate volume   Culture   Final    NO GROWTH 2 DAYS Performed at [facility], [address], [place], [place] [address]    Report Status PENDING  Incomplete  Blood Culture (routine x 2)     Status: None (  Preliminary result)   Collection Time: 02/12/18  6:50 PM  Result Value Ref Range Status   Specimen Description BLOOD HEMODIALYSIS FISTULA  Final   Special Requests   Final    BOTTLES DRAWN AEROBIC AND ANAEROBIC Blood Culture adequate volume   Culture   Final    NO GROWTH 2 DAYS Performed at [facility], [address], [place], [place] [address]    Report Status PENDING  Incomplete  Culture, expectorated sputum-assessment     Status: None   Collection Time: 02/13/18  4:41 PM  Result Value Ref Range Status   Specimen Description EXPECTORATED SPUTUM  Final   Special Requests Normal  Final   Sputum evaluation   Final    THIS SPECIMEN IS ACCEPTABLE FOR SPUTUM CULTURE Performed at [facility], [address] [place], [place] [address]    Report Status 02/13/2018 FINAL  Final  Culture, respiratory (NON-Expectorated)     Status: None (Preliminary result)   Collection Time: 02/13/18  4:41 PM   Result Value Ref Range Status   Specimen Description   Final    EXPECTORATED SPUTUM Performed at [facility], [address], [place], [place] [address]    Special Requests   Final    Normal Reflexed from [id number] Performed at [facility], [place], [place] [address]    Gram Stain   Final    NO WBC SEEN RARE SQUAMOUS EPITHELIAL CELLS PRESENT RARE BUDDING YEAST SEEN RARE GRAM POSITIVE COCCI IN PAIRS Performed at [facility], [place] [address] [place],  [address]    Culture PENDING  Incomplete   Report Status PENDING  Incomplete    Coagulation Studies: No results for input(s): LABPROT, INR in the last 72 hours.  Urinalysis: No results for input(s): COLORURINE, LABSPEC, PHURINE, GLUCOSEU, HGBUR, BILIRUBINUR, KETONESUR, PROTEINUR, UROBILINOGEN, NITRITE, LEUKOCYTESUR in the last 72 hours.  Invalid input(s): APPERANCEUR    Imaging: No results found.   Medications:    . acidophilus  1 capsule Oral Daily  . aspirin EC  81 mg Oral Daily  . atorvastatin  20 mg Oral QPM  . carvedilol  25 mg Oral BID WC  . Chlorhexidine Gluconate Cloth  6 each Topical Q0600  . chlorpheniramine-HYDROcodone  5 mL Oral Q12H  . clopidogrel  75 mg Oral Daily  . epoetin (EPOGEN/PROCRIT) injection  4,000 Units Intravenous Q M,W,F-HD  . famotidine  20 mg Oral Daily  . FLUoxetine  20 mg Oral QHS  . heparin injection (subcutaneous)  5,000 Units Subcutaneous Q8H  . insulin aspart  0-9 Units Subcutaneous TID WC  . insulin detemir  2 Units Subcutaneous QHS  . levofloxacin  500 mg Oral Q48H  . lipase/protease/amylase  36,000 Units Oral TID AC  . pantoprazole  40 mg Oral Daily  . rifaximin  550 mg Oral BID  . sevelamer carbonate  800 mg Oral BID BM  . cyanocobalamin  1,000 mcg Oral Daily   albuterol, bisacodyl, guaiFENesin-dextromethorphan, HYDROcodone-acetaminophen, loperamide, promethazine, traZODone  Assessment/ Plan:  57 y.o. female with diabetes,  hypertension, hyperlipidemia, diabetic retinopathy, diabetic neuropathy, diabetic gastroparesis, pancreatic insufficiency, depression, GERD. Renal masses, M-spike  5/24: Admitted under observation for cough, sputum production and pneumonia  MWF [facility], left arm AVG  1. ESRD -Next hemodialysis planned for Monday to keep her on her schedule  2.  Anemia of chronic kidney disease Erythropoietin with hemodialysis  3.  Secondary hyperparathyroidism Currently on sevelamer to be given with meals  Decrease dose to 1 tab w/meals while in hospital as oral intake is low  4.  Pneumonia Antibiotics as per hospitalist team Blood culture drawn and Given Vancomycin with HD       LOS: 0 Janayah Zavada Candiss Norse 5/26/201910:35 AM  [place], [place]  Note: This note was prepared with Dragon dictation. Any transcription errors are unintentional

## 2018-02-14 NOTE — Progress Notes (Signed)
Pharmacy Antibiotic Note  [name] is a 57 y.o. female admitted on 02/12/2018 with pneumonia.  Pharmacy has been consulted for levofloxacin dosing.  Plan: Levofloxacin 500mg  PO Q48hr.   Height: 5' 5.5" (166.4 cm) Weight: 168 lb 11.2 oz (76.5 kg) IBW/kg (Calculated) : 58.15  Temp (24hrs), Avg:98.4 F (36.9 C), Min:98.2 F (36.8 C), Max:99.1 F (37.3 C)  Recent Labs  Lab 02/12/18 0920 02/13/18 0523  WBC 8.5 6.8  CREATININE 7.55* 5.99*  LATICACIDVEN 0.7  --     Estimated Creatinine Clearance: 10.6 mL/min (A) (by C-G formula based on SCr of 5.99 mg/dL (H)).    Allergies  Allergen Reactions  . Ace Inhibitors Swelling and Anaphylaxis  . Ativan [Lorazepam] Other (See Comments)    Reaction:Hallucinations and headaches  . Compazine [Prochlorperazine Edisylate] Anaphylaxis, Nausea And Vomiting and Other (See Comments)    Other reaction(s): dystonia from this vs. Reglan, 23 Jul - patient relates that she takes promethazine frequently with no problems  . Sumatriptan Succinate Other (See Comments)    Other reaction(s): delirium and hallucinations per [facility] records  . Dilaudid [Hydromorphone Hcl] Other (See Comments)    Delirium   . Ondansetron Other (See Comments)    hallucinations    . Zofran [Ondansetron Hcl] Other (See Comments)    Reaction:  hallucinations   . Codeine Nausea And Vomiting  . Gabapentin Other (See Comments)    Reaction:  Unknown    . Lac Bovis Nausea And Vomiting  . Losartan Nausea Only  . Oxycodone Anxiety  . Prochlorperazine Other (See Comments)    Reaction:  Unknown . Patient does not remember reaction but she does have vertigo and anxiety along with n and v at times. Could be used to treat any of these   . Reglan [Metoclopramide] Other (See Comments)    Per patient her Dr. Evelina Bucy her off it   . Scopolamine Other (See Comments)    Dizziness, also has vertigo already  . Tape Rash  . Tapentadol Rash    Antimicrobials this admission: Levofloxacin  5/24 >>  Dose adjustments this admission: N/A  Microbiology results: 5/24 BCx: no growth x 2 days  5/24 Sputum: pending   Thank you for allowing pharmacy to be a part of this patient's care.  Simpson,Michael L 02/14/2018 10:33 AM

## 2018-02-15 LAB — GLUCOSE, CAPILLARY
Glucose-Capillary: 78 mg/dL (ref 65–99)
Glucose-Capillary: 91 mg/dL (ref 65–99)

## 2018-02-15 MED ORDER — SEVELAMER CARBONATE 800 MG PO TABS
800.0000 mg | ORAL_TABLET | Freq: Three times a day (TID) | ORAL | Status: DC
  Administered 2018-02-15: 12:00:00 800 mg via ORAL
  Filled 2018-02-15 (×3): qty 1

## 2018-02-15 MED ORDER — IPRATROPIUM-ALBUTEROL 0.5-2.5 (3) MG/3ML IN SOLN
3.0000 mL | Freq: Four times a day (QID) | RESPIRATORY_TRACT | Status: DC
  Administered 2018-02-15 (×2): 3 mL via RESPIRATORY_TRACT
  Filled 2018-02-15 (×2): qty 3

## 2018-02-15 MED ORDER — RENA-VITE PO TABS
1.0000 | ORAL_TABLET | Freq: Every day | ORAL | Status: DC
  Filled 2018-02-15: qty 1

## 2018-02-15 MED ORDER — PROMETHAZINE HCL 25 MG/ML IJ SOLN
12.5000 mg | Freq: Once | INTRAMUSCULAR | Status: AC
  Administered 2018-02-15: 15:00:00 12.5 mg via INTRAVENOUS
  Filled 2018-02-15: qty 1

## 2018-02-15 MED ORDER — GUAIFENESIN ER 600 MG PO TB12
600.0000 mg | ORAL_TABLET | Freq: Once | ORAL | Status: AC
  Administered 2018-02-15: 600 mg via ORAL
  Filled 2018-02-15: qty 1

## 2018-02-15 MED ORDER — VITAMIN E 45 MG (100 UNIT) PO CAPS
100.0000 [IU] | ORAL_CAPSULE | Freq: Every day | ORAL | Status: DC
  Administered 2018-02-15: 15:00:00 100 [IU] via ORAL
  Filled 2018-02-15 (×2): qty 1

## 2018-02-15 MED ORDER — AMOXICILLIN-POT CLAVULANATE 875-125 MG PO TABS: 1.0000 | ORAL_TABLET | Freq: Two times a day (BID) | ORAL | 0 refills | Status: AC

## 2018-02-15 NOTE — Progress Notes (Signed)
Pre HD assessment    02/15/18 1647  Neurological  Level of Consciousness Alert  Orientation Level Oriented X4  Respiratory  Respiratory Pattern Regular;Unlabored  Chest Assessment Chest expansion symmetrical  Cardiac  ECG Monitor Yes  Vascular  R Radial Pulse +1  L Radial Pulse +1  Integumentary  Integumentary (WDL) X  Skin Color Appropriate for ethnicity  Musculoskeletal  Musculoskeletal (WDL) X  Generalized Weakness Yes  Assistive Device None  GU Assessment  Genitourinary (WDL) X  Genitourinary Symptoms  (HD)  Psychosocial  Psychosocial (WDL) WDL

## 2018-02-15 NOTE — Progress Notes (Signed)
HD tx end   02/15/18 2102  Vital Signs  Pulse Rate 69  Pulse Rate Source Monitor  Resp (!) 26  BP (!) 147/55  BP Location Right Arm  BP Method Automatic  Patient Position (if appropriate) Lying  Oxygen Therapy  SpO2 100 %  O2 Device Nasal Cannula  O2 Flow Rate (L/min) 1 L/min  During Hemodialysis Assessment  Dialysis Fluid Bolus Normal Saline  Bolus Amount (mL) 250 mL  Intra-Hemodialysis Comments Tx completed

## 2018-02-15 NOTE — Progress Notes (Signed)
System clotted, unable to rinse pt back. Pt had 5 minutes of tx remaining,MD aware.    02/15/18 2045  Vital Signs  Pulse Rate 63  Pulse Rate Source Monitor  Resp 15  BP (!) 140/52  BP Location Right Arm  BP Method Automatic  Patient Position (if appropriate) Lying  Oxygen Therapy  SpO2 99 %  O2 Device Nasal Cannula  O2 Flow Rate (L/min) 1 L/min  During Hemodialysis Assessment  Intra-Hemodialysis Comments See progress note (sysytem clotted unable to rinse pt back, 5 minutes of tx)

## 2018-02-15 NOTE — Progress Notes (Signed)
Post HD assessment    02/15/18 2115  Neurological  Level of Consciousness Alert  Orientation Level Oriented X4  Respiratory  Respiratory Pattern Regular;Unlabored  Chest Assessment Chest expansion symmetrical  Cardiac  ECG Monitor Yes  Vascular  R Radial Pulse +1  L Radial Pulse +1  Integumentary  Integumentary (WDL) X  Skin Color Appropriate for ethnicity  Musculoskeletal  Musculoskeletal (WDL) X  Generalized Weakness Yes  Assistive Device None  GU Assessment  Genitourinary (WDL) X  Genitourinary Symptoms  (HD)  Psychosocial  Psychosocial (WDL) X  Patient Behaviors Cooperative;Restless;Agitated;Irritable

## 2018-02-15 NOTE — Care Management (Signed)
Discharge later today after dialysis per Dr. Manuella Ghazi. Home Health orders faxed to [facility]. Shelbie Ammons RN MSN CCM Care management [phone]

## 2018-02-15 NOTE — Progress Notes (Signed)
Pre HD assessment    02/15/18 1646  Vital Signs  Temp 98.7 F (37.1 C)  Temp Source Oral  Pulse Rate 60  Pulse Rate Source Monitor  Resp 19  BP 105/81  BP Location Right Arm  BP Method Automatic  Patient Position (if appropriate) Lying  Oxygen Therapy  SpO2 (!) 88 %  O2 Device Room Air  Pain Assessment  Pain Scale 0-10  Pain Score Asleep  Dialysis Weight  Weight 81.7 kg (180 lb 1.9 oz)  Type of Weight Pre-Dialysis  Time-Out for Hemodialysis  What Procedure? HD  Pt Identifiers(min of two) First/Last Name;MRN/Account#  Correct Site? Yes  Correct Side? Yes  Correct Procedure? Yes  Consents Verified? Yes  Rad Studies Available? N/A  Safety Precautions Reviewed? Yes  Engineer, civil (consulting) Number  ([id number])  Station Number 1  UF/Alarm Test Passed  Conductivity: Meter 14  Conductivity: Machine  14.3  pH 7.6  Reverse Osmosis main  Normal Saline Lot Number [id number]  Dialyzer Lot Number [id number]  Disposable Set Lot Number [id number]  Machine Temperature 98.6 F (37 C)  Musician and Audible Yes  Blood Lines Intact and Secured Yes  Pre Treatment Patient Checks  Vascular access used during treatment Graft  Hepatitis B Surface Antigen Results Negative  Date Hepatitis B Surface Antigen Drawn 05/04/17  Hepatitis B Surface Antibody  (>10)  Date Hepatitis B Surface Antibody Drawn 05/04/17  Hemodialysis Consent Verified Yes  Hemodialysis Standing Orders Initiated Yes  ECG (Telemetry) Monitor On Yes  Prime Ordered Normal Saline  Length of  DialysisTreatment -hour(s) 3.5 Hour(s)  Dialyzer Elisio 17H NR  Dialysate 3K, 2.5 Ca  Dialysis Anticoagulant None  Dialysate Flow Ordered 800  Blood Flow Rate Ordered 400 mL/min  Ultrafiltration Goal 1.5 Liters  Dialysis Blood Pressure Support Ordered Normal Saline  Education / Care Plan  Dialysis Education Provided Yes  Documented Education in Care Plan Yes  Fistula / Graft Left Upper arm Arteriovenous vein graft  Placement Date:  (c)   Placed prior to admission: Yes  Orientation: Left  Access Location: Upper arm  Access Type: Arteriovenous vein graft  Site Condition No complications  Fistula / Graft Assessment Present;Thrill;Bruit  Drainage Description None

## 2018-02-15 NOTE — Discharge Instructions (Signed)
Community-Acquired Pneumonia, Adult Pneumonia is an infection of the lungs. One type of pneumonia can happen while a person is in a hospital. A different type can happen when a person is not in a hospital (community-acquired pneumonia). It is easy for this kind to spread from person to person. It can spread to you if you breathe near an infected person who coughs or sneezes. Some symptoms include:  A dry cough.  A wet (productive) cough.  Fever.  Sweating.  Chest pain.  Follow these instructions at home:  Take over-the-counter and prescription medicines only as told by your doctor. ? Only take cough medicine if you are losing sleep. ? If you were prescribed an antibiotic medicine, take it as told by your doctor. Do not stop taking the antibiotic even if you start to feel better.  Sleep with your head and neck raised (elevated). You can do this by putting a few pillows under your head, or you can sleep in a recliner.  Do not use tobacco products. These include cigarettes, chewing tobacco, and e-cigarettes. If you need help quitting, ask your doctor.  Drink enough water to keep your pee (urine) clear or pale yellow. A shot (vaccine) can help prevent pneumonia. Shots are often suggested for:  People older than 59 years of age.  People older than 59 years of age: ? Who are having cancer treatment. ? Who have long-term (chronic) lung disease. ? Who have problems with their body's defense system (immune system).  You may also prevent pneumonia if you take these actions:  Get the flu (influenza) shot every year.  Go to the dentist as often as told.  Wash your hands often. If soap and water are not available, use hand sanitizer.  Contact a doctor if:  You have a fever.  You lose sleep because your cough medicine does not help. Get help right away if:  You are short of breath and it gets worse.  You have more chest pain.  Your sickness gets worse. This is very serious  if: ? You are an older adult. ? Your body's defense system is weak.  You cough up blood. This information is not intended to replace advice given to you by your health care provider. Make sure you discuss any questions you have with your health care provider. Document Released: 02/25/2008 Document Revised: 02/14/2016 Document Reviewed: 01/03/2015 Elsevier Interactive Patient Education  2018 [employer]vier Inc[employer]  

## 2018-02-15 NOTE — Progress Notes (Signed)
Initial Nutrition Assessment  DOCUMENTATION CODES:   Not applicable  INTERVENTION:  Patient refused any oral nutrition supplements or snacks.  Encouraged adequate intake of calories and protein at meals. Discussed choosing HBV protein on HD.  Continue Rena-vite QHS.  NUTRITION DIAGNOSIS:   Inadequate oral intake related to decreased appetite as evidenced by per patient/family report.  GOAL:   Patient will meet greater than or equal to 90% of their needs  MONITOR:   PO intake, Labs, Weight trends, I & O's  REASON FOR ASSESSMENT:   Consult Assessment of nutrition requirement/status  ASSESSMENT:   57 year old female with PMHx of asthma, DM type 2, HTN, CAD, diabetic neuropathy, diabetic gastroparesis, ESRD on HD, GERD, pancreatitis/pancreatic insufficiency, hx MI, mitral regurgitation, COPD, depression, anxiety, diverticulosis, hx of hiatal hernia who is now admitted with PNA.   Met with patient at bedside. She is hard of hearing. Patient reports her appetite is "not good." She is not able to elaborate much or give a good history. She reports typically eating 2 meals per day. She may have a bowl of cereal for breakfast and soup later in the day. She has previously tried an oral nutrition supplement but it gave her diarrhea. Attempted to clarify which supplement gave her diarrhea, but patient is unable to describe what supplement or bottle looks like. She refuses any oral nutrition supplements. She also refuses any snacks. She reports she has met with her RD at the dialysis center before. She is aware of need for adequate protein while on HD. Plan is for patient to discharge today after HD.  Patient reports she is unsure of her UBW because it goes up and down. She is unsure of her dry weight. Weight in chart appears fairly stable at 177-184 lbs.   Medications reviewed and include: acidophilus 1 capsule daily, Epogen with HD, famotidine, Novolog 0-9 units TID, Levaquin, Creon 36000  units TID before meals, Rena-vite QHS, pantoprazole, Renvela 800 mg TID with meals, vitamin B12 1000 micrograms daily, vitamin E 100 units daily, vancomycin.  Labs reviewed: CBG 48-142.  Patient does not meet criteria for malnutrition at this time.  Discussed with RN.  NUTRITION - FOCUSED PHYSICAL EXAM:    Most Recent Value  Orbital Region  No depletion  Upper Arm Region  No depletion  Thoracic and Lumbar Region  No depletion  Buccal Region  No depletion  Temple Region  No depletion  Clavicle Bone Region  No depletion  Clavicle and Acromion Bone Region  No depletion  Scapular Bone Region  No depletion  Dorsal Hand  No depletion  Patellar Region  No depletion  Anterior Thigh Region  No depletion  Posterior Calf Region  No depletion  Edema (RD Assessment)  None  Hair  Reviewed  Eyes  Reviewed  Mouth  Reviewed  Skin  Reviewed  Nails  Reviewed     Diet Order:   Diet Order           Diet - low sodium heart healthy        Diet heart healthy/carb modified Room service appropriate? Yes; Fluid consistency: Thin  Diet effective now          EDUCATION NEEDS:   Education needs have been addressed  Skin:  Skin Assessment: Reviewed RN Assessment(s/p amputation to left toe)  Last BM:  02/12/2018 - large type 4  Height:   Ht Readings from Last 1 Encounters:  02/12/18 5' 5.5" (1.664 m)    Weight:  Wt Readings from Last 1 Encounters:  02/15/18 178 lb 14.4 oz (81.1 kg)    Ideal Body Weight:  58 kg  BMI:  Body mass index is 29.32 kg/m.  Estimated Nutritional Needs:   Kcal:  1825-2100 (MSJ x 1.3-1.5)  Protein:  95-105 grams (1.2-1.3 grams/kg)  Fluid:  UOP + 1 L  Willey Blade, MS, RD, LDN Office: [phone] Pager: [phone] After Hours/Weekend Pager: [phone]

## 2018-02-15 NOTE — Progress Notes (Signed)
[place], [place] 02/15/18  Subjective:  Patient seen at bedside. Being treated for pneumonia. Renvela dosage corrected as it was scheduled for 2 times a day in between meals.    Objective:  Vital signs in last 24 hours:  Temp:  [98 F (36.7 C)-98.7 F (37.1 C)] 98.6 F (37 C) (05/27 0517) Pulse Rate:  [59-63] 59 (05/27 0517) Resp:  [16-20] 16 (05/27 0517) BP: (118-132)/(37-55) 119/55 (05/27 0517) SpO2:  [64 %-97 %] 95 % (05/27 0751) Weight:  [81.1 kg (178 lb 14.4 oz)] 81.1 kg (178 lb 14.4 oz) (05/27 0517)  Weight change: 1.349 kg (2 lb 15.6 oz) Filed Weights   02/13/18 1500 02/14/18 0500 02/15/18 0517  Weight: 77.6 kg (171 lb 1.2 oz) 76.5 kg (168 lb 11.2 oz) 81.1 kg (178 lb 14.4 oz)    Intake/Output:    Intake/Output Summary (Last 24 hours) at 02/15/2018 1149 Last data filed at 02/14/2018 1448 Gross per 24 hour  Intake 196.21 ml  Output -  Net 196.21 ml     Physical Exam: General:  Laying in the bed, ill-appearing  HEENT  moist oral mucous membranes  Neck  supple  Pulm/lungs  bilateral rales, normal effort  CVS/Heart  regular, no rub or gallop  Abdomen:   Soft, nontender  Extremities:  No edema  Neurologic:  Awake, alert, follows commands  Skin:  No acute rashes  Access:  Left upper extremity AVG       Basic Metabolic Panel:  Recent Labs  Lab 02/12/18 0920 02/12/18 1900 02/13/18 0523  NA 135  --  135  K 4.6  --  4.4  CL 95*  --  96*  CO2 27  --  28  GLUCOSE 69  --  83  BUN 41*  --  29*  CREATININE 7.55*  --  5.99*  CALCIUM 7.9*  --  7.8*  PHOS  --  3.7  --      CBC: Recent Labs  Lab 02/12/18 0920 02/13/18 0523  WBC 8.5 6.8  NEUTROABS 6.1  --   HGB 9.9* 9.3*  HCT 30.1* 28.3*  MCV 93.7 92.9  PLT 168 141*      Lab Results  Component Value Date   HEPBSAG Negative 05/04/2017   HEPBIGM  06/12/2010    NEGATIVE (NOTE) High levels of Hepatitis B Core IgM antibody are detectable during the acute stage of  Hepatitis B. This antibody is used to differentiate current from past HBV infection.       Microbiology:  Recent Results (from the past 240 hour(s))  Blood Culture (routine x 2)     Status: None (Preliminary result)   Collection Time: 02/12/18  9:20 AM  Result Value Ref Range Status   Specimen Description BLOOD R H  Final   Special Requests   Final    BOTTLES DRAWN AEROBIC AND ANAEROBIC Blood Culture adequate volume   Culture   Final    NO GROWTH 3 DAYS Performed at [facility], [address] [place], [place] [address]    Report Status PENDING  Incomplete  Blood Culture (routine x 2)     Status: None (Preliminary result)   Collection Time: 02/12/18  6:50 PM  Result Value Ref Range Status   Specimen Description BLOOD HEMODIALYSIS FISTULA  Final   Special Requests   Final    BOTTLES DRAWN AEROBIC AND ANAEROBIC Blood Culture adequate volume   Culture   Final    NO GROWTH 3 DAYS  Performed at [facility], [address], [place], [place] [address]    Report Status PENDING  Incomplete  Culture, expectorated sputum-assessment     Status: None   Collection Time: 02/13/18  4:41 PM  Result Value Ref Range Status   Specimen Description EXPECTORATED SPUTUM  Final   Special Requests Normal  Final   Sputum evaluation   Final    THIS SPECIMEN IS ACCEPTABLE FOR SPUTUM CULTURE Performed at [facility], [address], [place], [place] [address]    Report Status 02/13/2018 FINAL  Final  Culture, respiratory (NON-Expectorated)     Status: None (Preliminary result)   Collection Time: 02/13/18  4:41 PM  Result Value Ref Range Status   Specimen Description   Final    EXPECTORATED SPUTUM Performed at [facility], [address] [place], [place] [address]    Special Requests   Final    Normal Reflexed from [id number] Performed at [facility], [address] [place], [place] [address]    Gram Stain   Final    NO WBC SEEN RARE SQUAMOUS  EPITHELIAL CELLS PRESENT RARE BUDDING YEAST SEEN RARE GRAM POSITIVE COCCI IN PAIRS    Culture   Final    CULTURE REINCUBATED FOR BETTER GROWTH Performed at [facility], [place] [address] [place],  [address]    Report Status PENDING  Incomplete    Coagulation Studies: No results for input(s): LABPROT, INR in the last 72 hours.  Urinalysis: No results for input(s): COLORURINE, LABSPEC, PHURINE, GLUCOSEU, HGBUR, BILIRUBINUR, KETONESUR, PROTEINUR, UROBILINOGEN, NITRITE, LEUKOCYTESUR in the last 72 hours.  Invalid input(s): APPERANCEUR    Imaging: No results found.   Medications:   . vancomycin     . acidophilus  1 capsule Oral Daily  . aspirin EC  81 mg Oral Daily  . atorvastatin  20 mg Oral QPM  . Chlorhexidine Gluconate Cloth  6 each Topical Q0600  . chlorpheniramine-HYDROcodone  5 mL Oral Q12H  . clopidogrel  75 mg Oral Daily  . epoetin (EPOGEN/PROCRIT) injection  4,000 Units Intravenous Q M,W,F-HD  . famotidine  20 mg Oral Daily  . FLUoxetine  20 mg Oral QHS  . heparin injection (subcutaneous)  5,000 Units Subcutaneous Q8H  . insulin aspart  0-9 Units Subcutaneous TID WC  . ipratropium-albuterol  3 mL Nebulization Q6H  . levofloxacin  500 mg Oral Q48H  . lipase/protease/amylase  36,000 Units Oral TID AC  . multivitamin  1 tablet Oral QHS  . pantoprazole  40 mg Oral Daily  . rifaximin  550 mg Oral BID  . sevelamer carbonate  800 mg Oral TID WC  . cyanocobalamin  1,000 mcg Oral Daily  . vitamin E  100 Units Oral Daily   albuterol, bisacodyl, guaiFENesin-dextromethorphan, HYDROcodone-acetaminophen, loperamide, promethazine, traZODone  Assessment/ Plan:  57 y.o. female with diabetes, hypertension, hyperlipidemia, diabetic retinopathy, diabetic neuropathy, diabetic gastroparesis, pancreatic insufficiency, depression, GERD. Renal masses, M-spike  5/24: Admitted under observation for cough, sputum production and pneumonia  MWF [facility] [place], left arm  AVG  1. ESRD -Patient scheduled for hemodialysis today.  Orders have been prepared.  2.  Anemia of chronic kidney disease -Hemoglobin 9.3.  Maintain the patient on Epogen.  3.  Secondary hyperparathyroidism -Renvela dosage corrected in the system to 1 tablet p.o. 3 times daily with meals instead of in between meals.  4.  Bacterial pneumonia Patient currently on vancomycin and Levaquin.      LOS: 1 [name]  5/27/201911:49 AM  [place], Elbert  Note: This note was prepared with Dragon dictation. Any transcription errors are unintentional

## 2018-02-15 NOTE — Progress Notes (Signed)
End of shift medication 1700-1900 not given pt currently at dialysis. 1530 medications as well as apple juice sent to dialysis with pt record via transport services to dialysis.

## 2018-02-15 NOTE — Progress Notes (Signed)
Pt discharged home with family. Discharged orders explained and pt verbalized understanding. Prescription given to pt. IV removed. Pt had no questions.

## 2018-02-15 NOTE — Progress Notes (Signed)
Post HD assessment. Pt tolerated tx well without c/o. Pt's system clotted 5 minutes before tx end. Pt was unable to be rinsed back, MD aware. Net UF 1599, goal met.    02/15/18 2112  Vital Signs  Temp 98.4 F (36.9 C)  Temp Source Oral  Pulse Rate 74  Pulse Rate Source Monitor  Resp (!) 22  BP (!) 135/44  BP Location Right Arm  BP Method Automatic  Patient Position (if appropriate) Lying  Oxygen Therapy  SpO2 100 %  O2 Device Nasal Cannula  O2 Flow Rate (L/min) 1 L/min  Dialysis Weight  Weight 80.4 kg (177 lb 4 oz)  Type of Weight Post-Dialysis  Post-Hemodialysis Assessment  Rinseback Volume (mL) 100 mL  KECN 73.2 V  Dialyzer Clearance Heavily streaked  Duration of HD Treatment -hour(s) 3.5 hour(s)  Hemodialysis Intake (mL) 500 mL  UF Total -Machine (mL) 2099 mL  Net UF (mL) 1599 mL  Tolerated HD Treatment Yes  AVG/AVF Arterial Site Held (minutes) 10 minutes  AVG/AVF Venous Site Held (minutes) 10 minutes  Education / Care Plan  Dialysis Education Provided Yes  Documented Education in Care Plan Yes  Fistula / Graft Left Upper arm Arteriovenous vein graft  Placement Date: (c)   Placed prior to admission: Yes  Orientation: Left  Access Location: Upper arm  Access Type: Arteriovenous vein graft  Site Condition No complications  Fistula / Graft Assessment Present;Thrill;Bruit  Status Deaccessed  Drainage Description None

## 2018-02-15 NOTE — Progress Notes (Signed)
HD tx start    02/15/18 1706  Vital Signs  Pulse Rate 62  Pulse Rate Source Monitor  Resp 18  BP (!) 161/56  BP Location Right Arm  BP Method Automatic  Patient Position (if appropriate) Lying  Oxygen Therapy  SpO2 97 %  O2 Device Nasal Cannula  O2 Flow Rate (L/min) 1 L/min  During Hemodialysis Assessment  Blood Flow Rate (mL/min) 400 mL/min  Arterial Pressure (mmHg) -170 mmHg  Venous Pressure (mmHg) 200 mmHg  Transmembrane Pressure (mmHg) 70 mmHg  Ultrafiltration Rate (mL/min) 570 mL/min  Dialysate Flow Rate (mL/min) 800 ml/min  Conductivity: Machine  14.3  HD Safety Checks Performed Yes  Dialysis Fluid Bolus Normal Saline  Bolus Amount (mL) 250 mL  Intra-Hemodialysis Comments Tx initiated  Fistula / Graft Left Upper arm Arteriovenous vein graft  Placement Date: (c)   Placed prior to admission: Yes  Orientation: Left  Access Location: Upper arm  Access Type: Arteriovenous vein graft  Status Accessed  Needle Size 15

## 2018-02-15 NOTE — Progress Notes (Signed)
Pharmacy Antibiotic Note  [name] is a 57 y.o. female admitted on 02/12/2018 with pneumonia.  Pharmacy has been consulted for vancomycin dosing.  Plan: Vancomycin 750 mg IV with each HD session. Goal pre-HD level 15-25 mcg/ml. Vanc level prior to 3rd HD session.   Height: 5' 5.5" (166.4 cm) Weight: 178 lb 14.4 oz (81.1 kg) IBW/kg (Calculated) : 58.15  Temp (24hrs), Avg:98.4 F (36.9 C), Min:98 F (36.7 C), Max:98.7 F (37.1 C)  Recent Labs  Lab 02/12/18 0920 02/13/18 0523  WBC 8.5 6.8  CREATININE 7.55* 5.99*  LATICACIDVEN 0.7  --     Estimated Creatinine Clearance: 10.9 mL/min (A) (by C-G formula based on SCr of 5.99 mg/dL (H)).    Allergies  Allergen Reactions  . Ace Inhibitors Swelling and Anaphylaxis  . Ativan [Lorazepam] Other (See Comments)    Reaction:Hallucinations and headaches  . Compazine [Prochlorperazine Edisylate] Anaphylaxis, Nausea And Vomiting and Other (See Comments)    Other reaction(s): dystonia from this vs. Reglan, 23 Jul - patient relates that she takes promethazine frequently with no problems  . Sumatriptan Succinate Other (See Comments)    Other reaction(s): delirium and hallucinations per [facility] records  . Dilaudid [Hydromorphone Hcl] Other (See Comments)    Delirium   . Ondansetron Other (See Comments)    hallucinations    . Zofran [Ondansetron Hcl] Other (See Comments)    Reaction:  hallucinations   . Codeine Nausea And Vomiting  . Gabapentin Other (See Comments)    Reaction:  Unknown    . Lac Bovis Nausea And Vomiting  . Losartan Nausea Only  . Oxycodone Anxiety  . Prochlorperazine Other (See Comments)    Reaction:  Unknown . Patient does not remember reaction but she does have vertigo and anxiety along with n and v at times. Could be used to treat any of these   . Reglan [Metoclopramide] Other (See Comments)    Per patient her Dr. Evelina Bucy her off it   . Scopolamine Other (See Comments)    Dizziness, also has vertigo already  . Tape  Rash  . Tapentadol Rash   Thank you for allowing pharmacy to be a part of this patient's care.  Rocky Morel 02/15/2018 1:46 PM

## 2018-02-16 LAB — CULTURE, RESPIRATORY W GRAM STAIN
Culture: NORMAL
Gram Stain: NONE SEEN
Special Requests: NORMAL

## 2018-02-17 ENCOUNTER — Emergency Department: Payer: Medicare Other

## 2018-02-17 ENCOUNTER — Emergency Department
Admission: EM | Admit: 2018-02-17 | Discharge: 2018-02-17 | Disposition: A | Discharge status: Home / Self Care | Payer: Medicare Other | Attending: Student in an Organized Health Care Education/Training Program | Admitting: Student in an Organized Health Care Education/Training Program

## 2018-02-17 ENCOUNTER — Encounter: Payer: Self-pay | Admitting: Emergency Medicine

## 2018-02-17 DIAGNOSIS — Z85528 Personal history of other malignant neoplasm of kidney: Secondary | ICD-10-CM | POA: Insufficient documentation

## 2018-02-17 DIAGNOSIS — Z87891 Personal history of nicotine dependence: Secondary | ICD-10-CM | POA: Insufficient documentation

## 2018-02-17 DIAGNOSIS — R0789 Other chest pain: Secondary | ICD-10-CM | POA: Diagnosis not present

## 2018-02-17 DIAGNOSIS — R05 Cough: Secondary | ICD-10-CM | POA: Diagnosis not present

## 2018-02-17 DIAGNOSIS — E1140 Type 2 diabetes mellitus with diabetic neuropathy, unspecified: Secondary | ICD-10-CM | POA: Diagnosis not present

## 2018-02-17 DIAGNOSIS — Z794 Long term (current) use of insulin: Secondary | ICD-10-CM | POA: Diagnosis not present

## 2018-02-17 DIAGNOSIS — Z7902 Long term (current) use of antithrombotics/antiplatelets: Secondary | ICD-10-CM | POA: Diagnosis not present

## 2018-02-17 DIAGNOSIS — I2510 Atherosclerotic heart disease of native coronary artery without angina pectoris: Secondary | ICD-10-CM | POA: Diagnosis not present

## 2018-02-17 DIAGNOSIS — E1122 Type 2 diabetes mellitus with diabetic chronic kidney disease: Secondary | ICD-10-CM | POA: Insufficient documentation

## 2018-02-17 DIAGNOSIS — I5032 Chronic diastolic (congestive) heart failure: Secondary | ICD-10-CM | POA: Diagnosis not present

## 2018-02-17 DIAGNOSIS — R0602 Shortness of breath: Secondary | ICD-10-CM | POA: Diagnosis present

## 2018-02-17 DIAGNOSIS — J449 Chronic obstructive pulmonary disease, unspecified: Secondary | ICD-10-CM | POA: Diagnosis not present

## 2018-02-17 DIAGNOSIS — F1210 Cannabis abuse, uncomplicated: Secondary | ICD-10-CM | POA: Insufficient documentation

## 2018-02-17 DIAGNOSIS — Z992 Dependence on renal dialysis: Secondary | ICD-10-CM | POA: Insufficient documentation

## 2018-02-17 DIAGNOSIS — R059 Cough, unspecified: Secondary | ICD-10-CM

## 2018-02-17 DIAGNOSIS — J45909 Unspecified asthma, uncomplicated: Secondary | ICD-10-CM | POA: Diagnosis not present

## 2018-02-17 DIAGNOSIS — I132 Hypertensive heart and chronic kidney disease with heart failure and with stage 5 chronic kidney disease, or end stage renal disease: Secondary | ICD-10-CM | POA: Diagnosis not present

## 2018-02-17 DIAGNOSIS — I252 Old myocardial infarction: Secondary | ICD-10-CM | POA: Diagnosis not present

## 2018-02-17 DIAGNOSIS — Z79899 Other long term (current) drug therapy: Secondary | ICD-10-CM | POA: Insufficient documentation

## 2018-02-17 DIAGNOSIS — Z8673 Personal history of transient ischemic attack (TIA), and cerebral infarction without residual deficits: Secondary | ICD-10-CM | POA: Diagnosis not present

## 2018-02-17 DIAGNOSIS — N186 End stage renal disease: Secondary | ICD-10-CM | POA: Diagnosis not present

## 2018-02-17 DIAGNOSIS — Z7982 Long term (current) use of aspirin: Secondary | ICD-10-CM | POA: Insufficient documentation

## 2018-02-17 LAB — BASIC METABOLIC PANEL
Anion gap: 15 (ref 5–15)
BUN: 21 mg/dL — ABNORMAL HIGH (ref 6–20)
CO2: 26 mmol/L (ref 22–32)
Calcium: 8.7 mg/dL — ABNORMAL LOW (ref 8.9–10.3)
Chloride: 98 mmol/L — ABNORMAL LOW (ref 101–111)
Creatinine, Ser: 6.04 mg/dL — ABNORMAL HIGH (ref 0.44–1.00)
GFR calc Af Amer: 8 mL/min — ABNORMAL LOW (ref >60)
GFR calc non Af Amer: 7 mL/min — ABNORMAL LOW (ref >60)
Glucose, Bld: 124 mg/dL — ABNORMAL HIGH (ref 65–99)
Potassium: 4.4 mmol/L (ref 3.5–5.1)
Sodium: 139 mmol/L (ref 135–145)

## 2018-02-17 LAB — CULTURE, BLOOD (ROUTINE X 2)
Culture: NO GROWTH
Culture: NO GROWTH
Special Requests: ADEQUATE
Special Requests: ADEQUATE

## 2018-02-17 LAB — TROPONIN I: Troponin I: <0.03 ng/mL (ref <0.03)

## 2018-02-17 LAB — CBC
HCT: 30.5 % — ABNORMAL LOW (ref 35.0–47.0)
Hemoglobin: 10 g/dL — ABNORMAL LOW (ref 12.0–16.0)
MCH: 30.3 pg (ref 26.0–34.0)
MCHC: 32.8 g/dL (ref 32.0–36.0)
MCV: 92.5 fL (ref 80.0–100.0)
Platelets: 188 10*3/uL (ref 150–440)
RBC: 3.29 MIL/uL — ABNORMAL LOW (ref 3.80–5.20)
RDW: 17.5 % — ABNORMAL HIGH (ref 11.5–14.5)
WBC: 5.2 10*3/uL (ref 3.6–11.0)

## 2018-02-17 MED ORDER — HALOPERIDOL LACTATE 5 MG/ML IJ SOLN
10.0000 mg | Freq: Once | INTRAMUSCULAR | Status: AC
  Administered 2018-02-17: 10 mg via INTRAMUSCULAR
  Filled 2018-02-17: qty 2

## 2018-02-17 MED ORDER — IPRATROPIUM-ALBUTEROL 0.5-2.5 (3) MG/3ML IN SOLN
3.0000 mL | Freq: Once | RESPIRATORY_TRACT | Status: AC
  Administered 2018-02-17: 3 mL via RESPIRATORY_TRACT
  Filled 2018-02-17: qty 3

## 2018-02-17 MED ORDER — ALBUTEROL SULFATE HFA 108 (90 BASE) MCG/ACT IN AERS: 2.0000 | INHALATION_SPRAY | Freq: Four times a day (QID) | RESPIRATORY_TRACT | 2 refills | Status: DC | PRN

## 2018-02-17 MED ORDER — HYDROCODONE-ACETAMINOPHEN 5-325 MG PO TABS
1.0000 | ORAL_TABLET | Freq: Once | ORAL | Status: AC
  Administered 2018-02-17: 1 via ORAL
  Filled 2018-02-17: qty 1

## 2018-02-17 MED ORDER — LEVOFLOXACIN 750 MG PO TABS
ORAL_TABLET | ORAL | Status: AC
  Administered 2018-02-17: 750 mg via ORAL
  Filled 2018-02-17: qty 1

## 2018-02-17 MED ORDER — IPRATROPIUM-ALBUTEROL 0.5-2.5 (3) MG/3ML IN SOLN
RESPIRATORY_TRACT | Status: AC
  Filled 2018-02-17: qty 6

## 2018-02-17 MED ORDER — LEVOFLOXACIN 750 MG PO TABS
750.0000 mg | ORAL_TABLET | Freq: Once | ORAL | Status: AC
Start: 2018-02-17 — End: 2018-02-17
  Administered 2018-02-17: 750 mg via ORAL

## 2018-02-17 NOTE — Consult Note (Signed)
Patient Demographics  [name], is a 57 y.o. female   MRN: [id number]   DOB - 14-Mar-1961  Admit Date - 02/17/2018    Outpatient Primary MD for the patient is Freddy Finner, NP  Consult requested in the Hospital by Merlyn Lot, MD, On 02/17/2018    Reason for consult.  Cough HPI: 57 year old female patient with history of ESRD on hemodialysis Monday, Wednesday, Friday who was discharged from hospital 2 days ago for pneumonia, patient discharged home with Augmentin for 10 days she told me that she never started the antibiotic yet.  Today she came because of cough, pleuritic chest pain.  No shortness of breath.  Patient is requesting pain medicines, she denies any other complaints. Missed hemodialysis today.  But she is hemodynamically stable no hypoxia, electrolytes are normal with normal potassium.  Spoke with Dr. Rhett Bannister recommended that she make appointment tomorrow at dialysis center to get the dialysis.   Past Medical History:  Diagnosis Date  . Anemia   . Anginal pain ([place])   . Anxiety   . Arthritis   . Asthma   . Broken wrist   . Bronchitis   . chronic diastolic CHF 02/21/931  . COPD (chronic obstructive pulmonary disease) ([place])   . Coronary artery disease    a. cath 2013: stenting to RCA (report not available); b. cath 2014: LM nl, pLAD 40%, mLAD nl, ost LCx 40%, mid LCx nl, pRCA 30% @ site of prior stent, mRCA 50%  . Depression   . Diabetes mellitus without complication ([place])   . Diabetic neuropathy ([place])   . dialysis 2006  . Diverticulosis   . Dizziness   . Dyspnea   . Elevated lipids   . Environmental and seasonal allergies   . ESRD (end stage renal disease) on dialysis ([place])    M-W-F  . GERD (gastroesophageal reflux disease)   . Headache   . History of hiatal hernia   . HOH (hard of  hearing)   . Hx of pancreatitis 2015  . Hypertension   . Lower extremity edema   . Mitral regurgitation    a. echo 10/2013: EF 62%, noWMA, mildly dilated LA, mild to mod MR/TR, GR1DD  . Myocardial infarction ([place])   . Orthopnea   . Pneumonia   . Renal cancer ([place])   . Renal insufficiency    Pt is on dialysis on M,W + F.  . Wheezing       Past Surgical History:  Procedure Laterality Date  . A/V SHUNTOGRAM Left 01/20/2018   Procedure: A/V SHUNTOGRAM;  Surgeon: Algernon Huxley, MD;  Location: [place] [facility];  Service: Cardiovascular;  Laterality: Left;  . ABDOMINAL HYSTERECTOMY  1992  . AMPUTATION TOE Left 10/02/2017   Procedure: AMPUTATION TOE-LEFT GREAT TOE;  Surgeon: Albertine Patricia, DPM;  Location: [facility];  Service: Podiatry;  Laterality: Left;  . APPENDECTOMY    . CARDIAC CATHETERIZATION Left 07/26/2015   Procedure: Left Heart Cath and Coronary Angiography;  Surgeon: Earlyne Iba  [name] Dopp, MD;  Location: [place] [facility];  Service: Cardiovascular;  Laterality: Left;  . CATARACT EXTRACTION W/ INTRAOCULAR LENS IMPLANT Right   . CATARACT EXTRACTION W/PHACO Left 03/10/2017   Procedure: CATARACT EXTRACTION PHACO AND INTRAOCULAR LENS PLACEMENT (IOC);  Surgeon: Birder Robson, MD;  Location: [facility];  Service: Ophthalmology;  Laterality: Left;  [place] 00:51.9 AP% 14.2 CDE 7.39 fluid pack lot # [id number]  . CHOLECYSTECTOMY    . COLONOSCOPY WITH PROPOFOL N/A 08/12/2016   Procedure: COLONOSCOPY WITH PROPOFOL;  Surgeon: Lollie Sails, MD;  Location: [facility] ENDOSCOPY;  Service: Endoscopy;  Laterality: N/A;  . DIALYSIS FISTULA CREATION Left    upper arm  . dialysis grafts    . ESOPHAGOGASTRODUODENOSCOPY N/A 03/08/2015   Procedure: ESOPHAGOGASTRODUODENOSCOPY (EGD);  Surgeon: Manya Silvas, MD;  Location: [facility] ENDOSCOPY;  Service: Endoscopy;  Laterality: N/A;  . ESOPHAGOGASTRODUODENOSCOPY (EGD) WITH PROPOFOL N/A 03/18/2016   Procedure: ESOPHAGOGASTRODUODENOSCOPY (EGD) WITH PROPOFOL;   Surgeon: [name], MD;  Location: [facility] ENDOSCOPY;  Service: Endoscopy;  Laterality: N/A;  . EYE SURGERY Right 2018  . FECAL TRANSPLANT N/A 08/23/2015   Procedure: FECAL TRANSPLANT;  Surgeon: Manya Silvas, MD;  Location: [facility] ENDOSCOPY;  Service: Endoscopy;  Laterality: N/A;  . HAND SURGERY Bilateral   . PERIPHERAL VASCULAR CATHETERIZATION N/A 12/20/2015   Procedure: Thrombectomy of dialysis access versus permcath placement;  Surgeon: Algernon Huxley, MD;  Location: [place] [facility];  Service: Cardiovascular;  Laterality: N/A;  . PERIPHERAL VASCULAR CATHETERIZATION N/A 12/20/2015   Procedure: A/V Shunt Intervention;  Surgeon: Algernon Huxley, MD;  Location: [place] [facility];  Service: Cardiovascular;  Laterality: N/A;  . PERIPHERAL VASCULAR CATHETERIZATION N/A 12/20/2015   Procedure: A/V Shuntogram/Fistulagram;  Surgeon: Algernon Huxley, MD;  Location: [place] [facility];  Service: Cardiovascular;  Laterality: N/A;  . PERIPHERAL VASCULAR CATHETERIZATION N/A 01/02/2016   Procedure: A/V Shuntogram/Fistulagram;  Surgeon: Algernon Huxley, MD;  Location: [place] [facility];  Service: Cardiovascular;  Laterality: N/A;  . PERIPHERAL VASCULAR CATHETERIZATION N/A 01/02/2016   Procedure: A/V Shunt Intervention;  Surgeon: Algernon Huxley, MD;  Location: [place] [facility];  Service: Cardiovascular;  Laterality: N/A;    in for   Chief Complaint  Patient presents with  . Pneumonia        Review of Systems    In addition to the HPI above,  No Fever-chills, No Headache, No changes with Vision or hearing, No problems swallowing food or Liquids, Pleuritic chest pain, cough. No Abdominal pain, No Nausea or Vommitting, Bowel movements are regular, No Blood in stool or Urine, No dysuria, No new skin rashes or bruises, No new joints pains-aches,  No new weakness, tingling, numbness in any extremity, No recent weight gain or loss, No polyuria, polydypsia or polyphagia, No significant Mental  Stressors.  A full 10 point Review of Systems was done, except as stated above, all other Review of Systems were negative.   Social History Social History   Tobacco Use  . Smoking status: Former Smoker    Packs/day: 0.50    Years: 40.00    Pack years: 20.00    Types: Cigarettes    Last attempt to quit: 02/13/2015    Years since quitting: 3.0  . Smokeless tobacco: Never Used  Substance Use Topics  . Alcohol use: Not Currently    Comment: glass wine week per pt     Family History Family History  Problem Relation Age of Onset  . Kidney disease  Mother   . Diabetes Mother   . Cancer Father   . Kidney disease Sister      Prior to Admission medications   Medication Sig Start Date End Date Taking? Authorizing Provider  albuterol (PROVENTIL HFA;VENTOLIN HFA) 108 (90 Base) MCG/ACT inhaler Inhale 2 puffs into the lungs every 6 (six) hours as needed for wheezing or shortness of breath.     [provider]  albuterol (PROVENTIL) (2.5 MG/3ML) 0.083% nebulizer solution Take 2.5 mg by nebulization every 4 (four) hours as needed for wheezing or shortness of breath.    [provider]  amLODipine (NORVASC) 10 MG tablet Take 10 mg by mouth daily.    [provider]  amoxicillin-clavulanate (AUGMENTIN) 875-125 MG tablet Take 1 tablet by mouth 2 (two) times daily for 10 days. 02/15/18 02/25/18  Max Sane, MD  aspirin EC 81 MG tablet Take 81 mg by mouth daily. 09/08/16   [provider]  atorvastatin (LIPITOR) 20 MG tablet Take 20 mg by mouth every evening.  12/02/16   [provider]  Biotin 1 MG CAPS Take 1 mg by mouth daily.    [provider]  cetirizine (ZYRTEC) 10 MG tablet Take 10 mg by mouth daily.     [provider]  cholecalciferol (VITAMIN D) 400 units TABS tablet Take 400 Units by mouth daily.    [provider]  clopidogrel (PLAVIX) 75 MG tablet Take 1 tablet (75 mg total) by mouth daily. 03/25/17 03/25/18  Dustin Flock, MD  cyanocobalamin 1000 MCG tablet Take 1,000 mcg by mouth daily.     [provider]  diclofenac sodium (VOLTAREN) 1 % GEL Apply 1 application topically daily as needed (for rash).    [provider]  ethyl chloride spray Apply 1 application topically 3 (three) times a week. 01/18/18   [provider]  FLUoxetine (PROZAC) 20 MG capsule Take 1 capsule (20 mg total) by mouth daily. Patient taking differently: Take 20 mg by mouth at bedtime.  04/10/16   Hower, Aaron Mose, MD  glucose 4 GM chewable tablet Chew 3-4 tablets by mouth as needed for low blood sugar (blood sugar less than 60).     [provider]  HYDROcodone-acetaminophen (NORCO/VICODIN) 5-325 MG tablet Take 1 tablet by mouth every 6 (six) hours as needed for moderate pain or severe pain. Patient taking differently: Take 1 tablet by mouth 3 (three) times daily as needed for moderate pain or severe pain.  05/05/17   Gouru, Illene Silver, MD  hydrocortisone cream 0.5 % Apply topically 3 (three) times daily. 01/21/18   Wieting, Richard, MD  insulin aspart (NOVOLOG FLEXPEN) 100 UNIT/ML FlexPen Inject 4-15 Units into the skin 4 (four) times daily as needed for high blood sugar (> 100, per sliding scale).    [provider]  lipase/protease/amylase (CREON) 12000 UNITS CPEP capsule Take 36000 mg in the morning, 24000 midday & 36000 in the evening    [provider]  meclizine (ANTIVERT) 25 MG tablet Take 25 mg by mouth every 6 (six) hours as needed for dizziness.    [provider]  multivitamin (RENA-VIT) TABS tablet Take 1 tablet by mouth daily.     [provider]  nitroGLYCERIN (NITROSTAT) 0.4 MG SL tablet Place 0.4 mg under the tongue every 5 (five) minutes as needed for chest pain.     [provider]  ranitidine (ZANTAC) 300 MG capsule Take 1 capsule by mouth every evening. 01/19/18   [provider]  rifaximin (XIFAXAN) 550 MG TABS tablet Take 550 mg by mouth  2 (two) times daily.    [provider]  sevelamer carbonate (RENVELA) 800 MG tablet Take 800-2,400 mg by mouth See admin instructions. Take 2400 mg by mouth 3 times daily with meals and take 800 mg by mouth with snacks    [provider]    Anti-infectives (From admission, onward)   Start     Dose/Rate Route Frequency Ordered Stop   02/17/18 1430  levofloxacin (LEVAQUIN) tablet 750 mg     750 mg Oral  Once 02/17/18 1415 02/17/18 1420      Scheduled Meds: Continuous Infusions: PRN Meds:.  Allergies  Allergen Reactions  . Ace Inhibitors Swelling and Anaphylaxis  . Ativan [Lorazepam] Other (See Comments)    Reaction:Hallucinations and headaches  . Compazine [Prochlorperazine Edisylate] Anaphylaxis, Nausea And Vomiting and Other (See Comments)    Other reaction(s): dystonia from this vs. Reglan, 23 Jul - patient relates that she takes promethazine frequently with no problems  . Sumatriptan Succinate Other (See Comments)    Other reaction(s): delirium and hallucinations per [facility] records  . Dilaudid [Hydromorphone Hcl] Other (See Comments)    Delirium   . Ondansetron Other (See Comments)    hallucinations    . Zofran [Ondansetron Hcl] Other (See Comments)    Reaction:  hallucinations   . Codeine Nausea And Vomiting  . Gabapentin Other (See Comments)    Reaction:  Unknown    . [place] Nausea And Vomiting  . Losartan Nausea Only  . Oxycodone Anxiety  . Prochlorperazine Other (See Comments)    Reaction:  Unknown . Patient does not remember reaction but she does have vertigo and anxiety along with n and v at times. Could be used to treat any of these   . Reglan [Metoclopramide] Other (See Comments)    Per patient her Dr. Evelina Bucy her off it   . Scopolamine Other (See Comments)    Dizziness, also has vertigo already  . Tape Rash  . Tapentadol Rash    Physical Exam  Vitals  Blood pressure (!) 162/49, pulse 60, temperature 98.5 F (36.9 C), temperature  source Oral, resp. rate 17, height 5\' 5"  (1.651 m), weight 80.3 kg (177 lb), SpO2 98 %.   1. General ;alert, awake, oriented?  2. Normal affect and insight, Not Suicidal or Homicidal, Awake Alert, Oriented X 3.  3. No F.N deficits, ALL C.Nerves Intact, Strength 5/5 all 4 extremities, Sensation intact all 4 extremities, Plantars down going.  4. Ears and Eyes appear Normal, Conjunctivae clear, PERRLA. Moist Oral Mucosa.  5. Supple Neck, No JVD, No cervical lymphadenopathy appriciated, No Carotid Bruits.  6. Symmetrical Chest wall movement, Good air movement bilaterally, CTAB.  7. RRR, No Gallops, Rubs or Murmurs, No Parasternal Heave.  8. Positive Bowel Sounds, Abdomen Soft, No tenderness, No organomegaly appriciated,No rebound -guarding or rigidity.  9.  No Cyanosis, Normal Skin Turgor, No Skin Rash or Bruise.  10. Good muscle tone,  joints appear normal , no effusions, Normal ROM.  11. No Palpable Lymph Nodes in Neck or Axillae    Data Review  CBC Recent Labs  Lab 02/12/18 0920 02/13/18 0523 02/17/18 1247  WBC 8.5 6.8 5.2  HGB 9.9* 9.3* 10.0*  HCT 30.1* 28.3* 30.5*  PLT 168 141* 188  MCV 93.7 92.9 92.5  MCH 30.7 30.6 30.3  MCHC 32.8 33.0 32.8  RDW 18.6* 18.3* 17.5*  LYMPHSABS 1.3  --   --  MONOABS 0.7  --   --   EOSABS 0.3  --   --   BASOSABS 0.0  --   --    ------------------------------------------------------------------------------------------------------------------  Chemistries  Recent Labs  Lab 02/12/18 0920 02/13/18 0523 02/17/18 1247  NA 135 135 139  K 4.6 4.4 4.4  CL 95* 96* 98*  CO2 27 28 26   GLUCOSE 69 83 124*  BUN 41* 29* 21*  CREATININE 7.55* 5.99* 6.04*  CALCIUM 7.9* 7.8* 8.7*  AST 21  --   --   ALT 11*  --   --   ALKPHOS 100  --   --   BILITOT 0.6  --   --    ------------------------------------------------------------------------------------------------------------------ estimated creatinine clearance is 10.6 mL/min (A) (by C-G  formula based on SCr of 6.04 mg/dL (H)). ------------------------------------------------------------------------------------------------------------------ No results for input(s): TSH, T4TOTAL, T3FREE, THYROIDAB in the last 72 hours.  Invalid input(s): FREET3   Coagulation profile No results for input(s): INR, PROTIME in the last 168 hours. ------------------------------------------------------------------------------------------------------------------- No results for input(s): DDIMER in the last 72 hours. -------------------------------------------------------------------------------------------------------------------  Cardiac Enzymes Recent Labs  Lab 02/12/18 0920 02/17/18 1247  TROPONINI 0.03* <0.03   ------------------------------------------------------------------------------------------------------------------ Invalid input(s): POCBNP   ---------------------------------------------------------------------------------------------------------------  Urinalysis    Component Value Date/Time   COLORURINE YELLOW (A) 06/09/2017 1039   APPEARANCEUR CLEAR (A) 06/09/2017 1039   APPEARANCEUR Clear 09/15/2014 0947   LABSPEC 1.014 06/09/2017 1039   LABSPEC 1.008 09/15/2014 0947   PHURINE 8.0 06/09/2017 1039   GLUCOSEU 150 (A) 06/09/2017 1039   GLUCOSEU 150 mg/dL 09/15/2014 0947   HGBUR SMALL (A) 06/09/2017 1039   BILIRUBINUR NEGATIVE 06/09/2017 1039   BILIRUBINUR Negative 09/15/2014 0947   KETONESUR NEGATIVE 06/09/2017 1039   PROTEINUR >=300 (A) 06/09/2017 1039   UROBILINOGEN 0.2 06/30/2010 2130   NITRITE NEGATIVE 06/09/2017 1039   LEUKOCYTESUR NEGATIVE 06/09/2017 1039   LEUKOCYTESUR Negative 09/15/2014 0947     Imaging results:   Dg Chest 2 View  Result Date: 02/17/2018 CLINICAL DATA:  Chest pain EXAM: CHEST - 2 VIEW COMPARISON:  02/12/2018 FINDINGS: Moderate cardiomegaly. Bilateral central and basilar interstitial infiltrates and hazy airspace disease are improved.  No pneumothorax. No pleural effusion. Vascular stent projects over the left lung apex and left axilla. IMPRESSION: Improving pulmonary edema. Electronically Signed   By: Marybelle Killings M.D.   On: 02/17/2018 13:34      Assessment & Plan  Active Problems:   * No active hospital problems. *    65.  57 year old female patient with multiple medical problems of essential hypertension, hyperlipidemia chronic pancreatitis who was discharged from this hospital 2 days ago, admitted at that time for pneumonia and discharged home with Augmentin for 10 days, patient did not take antibiotics and comes today with same problem of pleuritic chest pain, cough.  Chest x-ray this time showed improving pulmonary edema.  Did not show pneumonia.  Patient had normal white count, no hypoxia.  Patient can be discharged from the emergency room with prescription for Augmentin for 10 days and also she is requesting cough medicine,vicodin #2 ESRD on hemodialysis, Monday, Wednesday, Friday but patient missed hemodialysis today, spoke with Dr. Luz Brazen relative he recommended that she call tomorrow to have dialysis tomorrow.  At this time she has no hyperkalemia or hypoxia no immediate need for dialysis today. 3.  Diabetesmellitus type II, blood sugar is acceptable.  Patient stable for discharge from emergency room.  Request case management to help for antibiotic refill before she goes home.  AM Labs  Ordered, also please review Full Orders Time spent on this consult 55 minutes.   Thank you for the consult, we will follow the patient with you in the Hospital.   Epifanio Lesches M.D on 02/17/2018 at 3:33 PM  Note: This dictation was prepared with Dragon dictation along with smaller phrase technology. Any transcriptional errors that result from this process are unintentional.

## 2018-02-17 NOTE — ED Triage Notes (Signed)
Pt in via EMS from home. EMS reports pt was dx'd with pneumonia on Monday and has not gotten her prescription filled and now feels worse.

## 2018-02-17 NOTE — ED Triage Notes (Signed)
Pt comes into the ED via POV c/o pneumonia.  Patient states she was discharged on Monday and she has not been able to fill her prescriptions for her antibiotics.  Patient is also a renal patient.  Patient unable to sit still in her wheelchair and moaning in discomfort at this time.  Patient states she has chest and lung pain.  Afebrile at this time and in NAD with stable vitals.

## 2018-02-17 NOTE — ED Provider Notes (Addendum)
[facility] Emergency Department Provider Note    First MD Initiated Contact with Patient 02/17/18 1405     (approximate)  I have reviewed the triage vital signs and the nursing notes.   HISTORY  Chief Complaint Pneumonia    HPI [name] is a 57 y.o. female extensive chronic medical conditions COPD as well as chronic CHF with recent admission in the hospital for hypoxic respiratory failure treated for pneumonia and discharged on Monday but went home did not get her medications filled and states that she is having worsening shortness of breath and chest pain.  Denies any nausea or vomiting.  Patient moaning in bed screaming help.  Pushing my hands away will not let me examine her.  Patient represents to the ER she missed dialysis today and is having worsening pain and states she was told to return to the ER by the discharging physician if symptoms worsen.  Past Medical History:  Diagnosis Date  . Anemia   . Anginal pain ([place])   . Anxiety   . Arthritis   . Asthma   . Broken wrist   . Bronchitis   . chronic diastolic CHF 03/13/2978  . COPD (chronic obstructive pulmonary disease) ([place])   . Coronary artery disease    a. cath 2013: stenting to RCA (report not available); b. cath 2014: LM nl, pLAD 40%, mLAD nl, ost LCx 40%, mid LCx nl, pRCA 30% @ site of prior stent, mRCA 50%  . Depression   . Diabetes mellitus without complication ([place])   . Diabetic neuropathy ([place])   . dialysis 2006  . Diverticulosis   . Dizziness   . Dyspnea   . Elevated lipids   . Environmental and seasonal allergies   . ESRD (end stage renal disease) on dialysis ([place])    M-W-F  . GERD (gastroesophageal reflux disease)   . Headache   . History of hiatal hernia   . HOH (hard of hearing)   . Hx of pancreatitis 2015  . Hypertension   . Lower extremity edema   . Mitral regurgitation    a. echo 10/2013: EF 62%, noWMA, mildly dilated LA, mild to mod MR/TR, GR1DD  . Myocardial  infarction ([place])   . Orthopnea   . Pneumonia   . Renal cancer ([place])   . Renal insufficiency    Pt is on dialysis on M,W + F.  . Wheezing    Family History  Problem Relation Age of Onset  . Kidney disease Mother   . Diabetes Mother   . Cancer Father   . Kidney disease Sister    Past Surgical History:  Procedure Laterality Date  . A/V SHUNTOGRAM Left 01/20/2018   Procedure: A/V SHUNTOGRAM;  Surgeon: Algernon Huxley, MD;  Location: [facility];  Service: Cardiovascular;  Laterality: Left;  . ABDOMINAL HYSTERECTOMY  1992  . AMPUTATION TOE Left 10/02/2017   Procedure: AMPUTATION TOE-LEFT GREAT TOE;  Surgeon: Albertine Patricia, DPM;  Location: [facility];  Service: Podiatry;  Laterality: Left;  . APPENDECTOMY    . CARDIAC CATHETERIZATION Left 07/26/2015   Procedure: Left Heart Cath and Coronary Angiography;  Surgeon: Dionisio David, MD;  Location: [facility];  Service: Cardiovascular;  Laterality: Left;  . CATARACT EXTRACTION W/ INTRAOCULAR LENS IMPLANT Right   . CATARACT EXTRACTION W/PHACO Left 03/10/2017   Procedure: CATARACT EXTRACTION PHACO AND INTRAOCULAR LENS PLACEMENT (IOC);  Surgeon: Birder Robson, MD;  Location: [facility];  Service: Ophthalmology;  Laterality: Left;  [place] 00:51.9 AP% 14.2 CDE 7.39 fluid pack lot # [id number]  . CHOLECYSTECTOMY    . COLONOSCOPY WITH PROPOFOL N/A 08/12/2016   Procedure: COLONOSCOPY WITH PROPOFOL;  Surgeon: Lollie Sails, MD;  Location: [facility] ENDOSCOPY;  Service: Endoscopy;  Laterality: N/A;  . DIALYSIS FISTULA CREATION Left    upper arm  . dialysis grafts    . ESOPHAGOGASTRODUODENOSCOPY N/A 03/08/2015   Procedure: ESOPHAGOGASTRODUODENOSCOPY (EGD);  Surgeon: Manya Silvas, MD;  Location: [facility] ENDOSCOPY;  Service: Endoscopy;  Laterality: N/A;  . ESOPHAGOGASTRODUODENOSCOPY (EGD) WITH PROPOFOL N/A 03/18/2016   Procedure: ESOPHAGOGASTRODUODENOSCOPY (EGD) WITH PROPOFOL;  Surgeon: [name], MD;  Location: [facility] ENDOSCOPY;  Service:  Endoscopy;  Laterality: N/A;  . EYE SURGERY Right 2018  . FECAL TRANSPLANT N/A 08/23/2015   Procedure: FECAL TRANSPLANT;  Surgeon: Manya Silvas, MD;  Location: [facility] ENDOSCOPY;  Service: Endoscopy;  Laterality: N/A;  . HAND SURGERY Bilateral   . PERIPHERAL VASCULAR CATHETERIZATION N/A 12/20/2015   Procedure: Thrombectomy of dialysis access versus permcath placement;  Surgeon: Algernon Huxley, MD;  Location: [facility];  Service: Cardiovascular;  Laterality: N/A;  . PERIPHERAL VASCULAR CATHETERIZATION N/A 12/20/2015   Procedure: A/V Shunt Intervention;  Surgeon: Algernon Huxley, MD;  Location: [facility];  Service: Cardiovascular;  Laterality: N/A;  . PERIPHERAL VASCULAR CATHETERIZATION N/A 12/20/2015   Procedure: A/V Shuntogram/Fistulagram;  Surgeon: Algernon Huxley, MD;  Location: [facility];  Service: Cardiovascular;  Laterality: N/A;  . PERIPHERAL VASCULAR CATHETERIZATION N/A 01/02/2016   Procedure: A/V Shuntogram/Fistulagram;  Surgeon: Algernon Huxley, MD;  Location: [facility];  Service: Cardiovascular;  Laterality: N/A;  . PERIPHERAL VASCULAR CATHETERIZATION N/A 01/02/2016   Procedure: A/V Shunt Intervention;  Surgeon: Algernon Huxley, MD;  Location: [facility];  Service: Cardiovascular;  Laterality: N/A;   Patient Active Problem List   Diagnosis Date Noted  . Gastroparesis due to DM ([place]) 01/18/2018  . Complication of vascular access for dialysis 12/04/2017  . Osteomyelitis ([place]) 09/30/2017  . Carotid stenosis 06/18/2017  . Shortness of breath 05/04/2017  . Cellulitis of lower extremity 07/29/2016  . Chronic venous insufficiency 07/29/2016  . Lymphedema 07/29/2016  . TIA (transient ischemic attack) 04/21/2016  . Altered mental status 04/08/2016  . Hyperammonemia ([place]) 04/08/2016  . Elevated troponin 04/08/2016  . Depression 04/08/2016  . Depression, major, recurrent, severe with psychosis ([place]) 04/08/2016  . Blood in stool   . Intractable cyclical  vomiting with nausea   . Reflux esophagitis   . Gastritis   . Generalized abdominal pain   . Uncontrollable vomiting   . Major depressive disorder, recurrent episode, moderate ([place]) 03/15/2016  . Adjustment disorder with mixed anxiety and depressed mood 03/15/2016  . Malnutrition of moderate degree 12/01/2015  . Renal mass   . Dyspnea   . Acute renal failure ([place])   . Respiratory failure ([place])   . High temperature 11/14/2015  . Pulmonary edema   . Encounter for central line placement   . Encounter for orogastric (OG) tube placement   . Nausea 11/12/2015  . Hyperkalemia 10/03/2015  . Diarrhea, unspecified 07/22/2015  . Pneumonia 05/21/2015  . Hypoglycemia 04/24/2015  . Unresponsiveness 04/24/2015  . Bradycardia 04/24/2015  . Hypothermia 04/24/2015  . Acute respiratory failure ([place]) 04/24/2015  . Acute diastolic CHF (congestive heart failure) ([place]) 04/05/2015  . Diabetic gastroparesis ([place]) 04/05/2015  . Hypokalemia 04/05/2015  . Generalized weakness 04/05/2015  . Acute pulmonary edema ([place]) 04/03/2015  .  Nausea and vomiting 04/03/2015  . Hypoglycemia associated with diabetes ([place]) 04/03/2015  . Anemia of chronic disease 04/03/2015  . Secondary hyperparathyroidism ([place]) 04/03/2015  . Pressure ulcer 04/02/2015  . Acute respiratory failure with hypoxia ([place]) 04/01/2015  . Adjustment disorder with anxiety 03/14/2015  . Somatic symptom disorder, mild 03/08/2015  . Coronary artery disease involving native coronary artery of native heart without angina pectoris   . Nausea & vomiting 03/06/2015  . Abdominal pain 03/06/2015  . DM (diabetes mellitus) ([place]) 03/06/2015  . HTN (hypertension) 03/06/2015  . Gastroparesis 02/24/2015  . Pleural effusion 02/19/2015  . HCAP (healthcare-associated pneumonia) 02/19/2015  . End-stage renal disease on hemodialysis ([place]) 02/19/2015      Prior to Admission medications   Medication Sig Start Date End Date Taking? Authorizing Provider    albuterol (PROVENTIL HFA;VENTOLIN HFA) 108 (90 Base) MCG/ACT inhaler Inhale 2 puffs into the lungs every 6 (six) hours as needed for wheezing or shortness of breath.     [provider]  albuterol (PROVENTIL) (2.5 MG/3ML) 0.083% nebulizer solution Take 2.5 mg by nebulization every 4 (four) hours as needed for wheezing or shortness of breath.    [provider]  amLODipine (NORVASC) 10 MG tablet Take 10 mg by mouth daily.    [provider]  amoxicillin-clavulanate (AUGMENTIN) 875-125 MG tablet Take 1 tablet by mouth 2 (two) times daily for 10 days. 02/15/18 02/25/18  Max Sane, MD  aspirin EC 81 MG tablet Take 81 mg by mouth daily. 09/08/16   [provider]  atorvastatin (LIPITOR) 20 MG tablet Take 20 mg by mouth every evening.  12/02/16   [provider]  Biotin 1 MG CAPS Take 1 mg by mouth daily.    [provider]  cetirizine (ZYRTEC) 10 MG tablet Take 10 mg by mouth daily.     [provider]  cholecalciferol (VITAMIN D) 400 units TABS tablet Take 400 Units by mouth daily.    [provider]  clopidogrel (PLAVIX) 75 MG tablet Take 1 tablet (75 mg total) by mouth daily. 03/25/17 03/25/18  Dustin Flock, MD  cyanocobalamin 1000 MCG tablet Take 1,000 mcg by mouth daily.     [provider]  diclofenac sodium (VOLTAREN) 1 % GEL Apply 1 application topically daily as needed (for rash).    [provider]  ethyl chloride spray Apply 1 application topically 3 (three) times a week. 01/18/18   [provider]  FLUoxetine (PROZAC) 20 MG capsule Take 1 capsule (20 mg total) by mouth daily. Patient taking differently: Take 20 mg by mouth at bedtime.  04/10/16   Hower, Aaron Mose, MD  glucose 4 GM chewable tablet Chew 3-4 tablets by mouth as needed for low blood sugar (blood sugar less than 60).     [provider]  HYDROcodone-acetaminophen (NORCO/VICODIN) 5-325 MG tablet Take 1 tablet by mouth every 6 (six)  hours as needed for moderate pain or severe pain. Patient taking differently: Take 1 tablet by mouth 3 (three) times daily as needed for moderate pain or severe pain.  05/05/17   Gouru, Illene Silver, MD  hydrocortisone cream 0.5 % Apply topically 3 (three) times daily. 01/21/18   Wieting, Richard, MD  insulin aspart (NOVOLOG FLEXPEN) 100 UNIT/ML FlexPen Inject 4-15 Units into the skin 4 (four) times daily as needed for high blood sugar (> 100, per sliding scale).    [provider]  lipase/protease/amylase (CREON) 12000 UNITS CPEP capsule Take 36000 mg in the morning, 24000 midday &  36000 in the evening    [provider]  meclizine (ANTIVERT) 25 MG tablet Take 25 mg by mouth every 6 (six) hours as needed for dizziness.    [provider]  multivitamin (RENA-VIT) TABS tablet Take 1 tablet by mouth daily.     [provider]  nitroGLYCERIN (NITROSTAT) 0.4 MG SL tablet Place 0.4 mg under the tongue every 5 (five) minutes as needed for chest pain.     [provider]  ranitidine (ZANTAC) 300 MG capsule Take 1 capsule by mouth every evening. 01/19/18   [provider]  rifaximin (XIFAXAN) 550 MG TABS tablet Take 550 mg by mouth 2 (two) times daily.    [provider]  sevelamer carbonate (RENVELA) 800 MG tablet Take 800-2,400 mg by mouth See admin instructions. Take 2400 mg by mouth 3 times daily with meals and take 800 mg by mouth with snacks    [provider]    Allergies Ace inhibitors; Ativan [lorazepam]; Compazine [prochlorperazine edisylate]; Sumatriptan succinate; Dilaudid [hydromorphone hcl]; Ondansetron; Zofran [ondansetron hcl]; Codeine; Gabapentin; [place]; Losartan; Oxycodone; Prochlorperazine; Reglan [metoclopramide]; Scopolamine; Tape; and Tapentadol    Social History Social History   Tobacco Use  . Smoking status: Former Smoker    Packs/day: 0.50    Years: 40.00    Pack years: 20.00    Types: Cigarettes    Last  attempt to quit: 02/13/2015    Years since quitting: 3.0  . Smokeless tobacco: Never Used  Substance Use Topics  . Alcohol use: Not Currently    Comment: glass wine week per pt  . Drug use: Yes    Types: Marijuana    Comment: once a day    Review of Systems Patient denies headaches, rhinorrhea, blurry vision, numbness, shortness of breath, chest pain, edema, cough, abdominal pain, nausea, vomiting, diarrhea, dysuria, fevers, rashes or hallucinations unless otherwise stated above in HPI. ____________________________________________   PHYSICAL EXAM:  VITAL SIGNS: Vitals:   02/17/18 1244  BP: (!) 162/49  Pulse: 60  Resp: 17  Temp: 98.5 F (36.9 C)  SpO2: 98%    Constitutional: Alert and oriented. Eyes: Conjunctivae are normal.  Head: Atraumatic. Nose: No congestion/rhinnorhea. Mouth/Throat: Mucous membranes are moist.   Neck: No stridor. Painless ROM.  Cardiovascular: Normal rate, regular rhythm. Grossly normal heart sounds.  Good peripheral circulation. Respiratory: Normal respiratory effort.  No retractions. Lungs with coarse bibasilar breathsounds Gastrointestinal: Soft and nontender. No distention. No abdominal bruits. No CVA tenderness. Genitourinary:  Musculoskeletal: No lower extremity tenderness nor edema.  No joint effusions. Neurologic:  Normal speech and language. No gross focal neurologic deficits are appreciated. No facial droop Skin:  Skin is warm, dry and intact. No rash noted. Psychiatric: anxious appearing ____________________________________________   LABS (all labs ordered are listed, but only abnormal results are displayed)  Results for orders placed or performed during the hospital encounter of 02/17/18 (from the past 24 hour(s))  Basic metabolic panel     Status: Abnormal   Collection Time: 02/17/18 12:47 PM  Result Value Ref Range   Sodium 139 135 - 145 mmol/L   Potassium 4.4 3.5 - 5.1 mmol/L   Chloride 98 (L) 101 - 111 mmol/L   CO2 26 22 - 32  mmol/L   Glucose, Bld 124 (H) 65 - 99 mg/dL   BUN 21 (H) 6 - 20 mg/dL   Creatinine, Ser 6.04 (H) 0.44 - 1.00 mg/dL   Calcium 8.7 (L) 8.9 - 10.3 mg/dL   GFR calc  non Af Amer 7 (L) >60 mL/min   GFR calc Af Amer 8 (L) >60 mL/min   Anion gap 15 5 - 15  CBC     Status: Abnormal   Collection Time: 02/17/18 12:47 PM  Result Value Ref Range   WBC 5.2 3.6 - 11.0 K/uL   RBC 3.29 (L) 3.80 - 5.20 MIL/uL   Hemoglobin 10.0 (L) 12.0 - 16.0 g/dL   HCT 30.5 (L) 35.0 - 47.0 %   MCV 92.5 80.0 - 100.0 fL   MCH 30.3 26.0 - 34.0 pg   MCHC 32.8 32.0 - 36.0 g/dL   RDW 17.5 (H) 11.5 - 14.5 %   Platelets 188 150 - 440 K/uL  Troponin I     Status: None   Collection Time: 02/17/18 12:47 PM  Result Value Ref Range   Troponin I <0.03 <0.03 ng/mL   ____________________________________________  EKG My review and personal interpretation at Time: 12:54   Indication: chest pain  Rate: 60  Rhythm: sinus Axis: normal Other: normal intervals, no stemi ____________________________________________  RADIOLOGY   ____________________________________________   PROCEDURES  Procedure(s) performed:  Procedures    Critical Care performed: no ____________________________________________   INITIAL IMPRESSION / ASSESSMENT AND PLAN / ED COURSE  Pertinent labs & imaging results that were available during my care of the patient were reviewed by me and considered in my medical decision making (see chart for details).  DDX: msk strain, anxiety, acs, dissection, pe, enteritis, gastroparesis  [name] is a 57 y.o. who presents to the ED with symptoms as described above.  Chest x-ray shows improvement in edema.  She has no hypoxia.  Vital signs are otherwise stable.  No evidence of emergent indication for dialysis.  EKG shows no acute ischemia and her initial troponin is negative.  We will give her her oral antibiotic as well as dose of IM Haldol for her pain.   Patient has been evaluated by hospitalist, Dr.  Vianne Bulls, who agreed patient is stable and appropriate for discharge home. Blood work is reassuring, no evidence of infiltrate or edema on CXR.  No evidence of sepsis. Patient is tolerating her oral medication. I did speak with the patient's son.  He states that the patient was supposed to have had home health nurse come check on her today but she obviously skipped this.  To come to the ER.  States that he is becoming increasingly frustrated with her going to and from the hospital.  As she is afbebrile with no hypoxia and reassuring blood work and CXR, at this point do believe patient stable and appropriate for continued outpatient follow-up.   As part of my medical decision making, I reviewed the following data within the [facility] notes reviewed and incorporated, Labs reviewed, notes from prior ED visits.  ____________________________________________   FINAL CLINICAL IMPRESSION(S) / ED DIAGNOSES  Final diagnoses:  Cough  Atypical chest pain      NEW MEDICATIONS STARTED DURING THIS VISIT:  New Prescriptions   No medications on file     Note:  This document was prepared using Dragon voice recognition software and may include unintentional dictation errors.    Merlyn Lot, MD 02/17/18 [place]    Merlyn Lot, MD 02/17/18 [phone]

## 2018-02-17 NOTE — ED Notes (Signed)
Pt verbalized discharge instructions and has no questions at this time 

## 2018-02-17 NOTE — ED Notes (Signed)
Pt cleaned up and repositioned in bed at this time 

## 2018-02-18 ENCOUNTER — Telehealth: Payer: Self-pay

## 2018-02-18 NOTE — Discharge Summary (Signed)
[facility] NAME: [name]    MR#:  [id number]  DATE OF BIRTH:  March 16, 1961  DATE OF ADMISSION:  02/12/2018   ADMITTING PHYSICIAN: Epifanio Lesches, MD  DATE OF DISCHARGE: 02/15/2018  9:45 PM  PRIMARY CARE PHYSICIAN: Freddy Finner, NP   ADMISSION DIAGNOSIS:  End stage renal disease on dialysis ([place]) [N18.6, Z99.2] Community acquired pneumonia, unspecified laterality [J18.9] DISCHARGE DIAGNOSIS:  Active Problems:   Pneumonia  SECONDARY DIAGNOSIS:   Past Medical History:  Diagnosis Date  . Anemia   . Anginal pain ([place])   . Anxiety   . Arthritis   . Asthma   . Broken wrist   . Bronchitis   . chronic diastolic CHF 8/58/8502  . COPD (chronic obstructive pulmonary disease) ([place])   . Coronary artery disease    a. cath 2013: stenting to RCA (report not available); b. cath 2014: LM nl, pLAD 40%, mLAD nl, ost LCx 40%, mid LCx nl, pRCA 30% @ site of prior stent, mRCA 50%  . Depression   . Diabetes mellitus without complication ([place])   . Diabetic neuropathy ([place])   . dialysis 2006  . Diverticulosis   . Dizziness   . Dyspnea   . Elevated lipids   . Environmental and seasonal allergies   . ESRD (end stage renal disease) on dialysis ([place])    M-W-F  . GERD (gastroesophageal reflux disease)   . Headache   . History of hiatal hernia   . HOH (hard of hearing)   . Hx of pancreatitis 2015  . Hypertension   . Lower extremity edema   . Mitral regurgitation    a. echo 10/2013: EF 62%, noWMA, mildly dilated LA, mild to mod MR/TR, GR1DD  . Myocardial infarction ([place])   . Orthopnea   . Pneumonia   . Renal cancer ([place])   . Renal insufficiency    Pt is on dialysis on M,W + F.  . Wheezing    HOSPITAL COURSE:  56 y.o. female with diabetes, hypertension, hyperlipidemia, diabetic retinopathy, diabetic neuropathy, diabetic gastroparesis, pancreatic insufficiency, depression, GERD. Renal masses, M-spike  5/24: Admitted under  observation for cough, sputum production and pneumonia  MWF [facility], left arm AVG  1. Bacterial pneumonia: TREATED WITH iv Abx while in the Hospital and being D/Ced on PO Augmentin for 10 days  2.  Anemia of chronic kidney disease -Hemoglobin 9.3.  Maintain the patient on Epogen as an outpt per Nephro.  3.  Secondary hyperparathyroidism -Renvela per Nephro.  4. ESRD on hemodialysis,, Wednesday, Friday, nephrology is following.    5. near syncopal event 2 days ago,  Likely secondary to overall deconditioning secondary to multiple medical problems, and now with pneumonia & or some hypoglycemia.  6. Transient hypoglycemia: likely due to poor PO intake, now sugars better controlled, may need adjustment as an outpt if blood sugar continues to drop   7.  History of CAD, continue aspirin, Plavix, statins  8 deconditioning: HHPT DISCHARGE CONDITIONS:  STABLE CONSULTS OBTAINED:  Treatment Team:  Murlean Iba, MD DRUG ALLERGIES:   Allergies  Allergen Reactions  . Ace Inhibitors Swelling and Anaphylaxis  . Ativan [Lorazepam] Other (See Comments)    Reaction:Hallucinations and headaches  . Compazine [Prochlorperazine Edisylate] Anaphylaxis, Nausea And Vomiting and Other (See Comments)    Other reaction(s): dystonia from this vs. Reglan, 23 Jul - patient relates that she takes promethazine frequently with no problems  . Sumatriptan Succinate Other (See  Comments)    Other reaction(s): delirium and hallucinations per [facility] records  . Dilaudid [Hydromorphone Hcl] Other (See Comments)    Delirium   . Ondansetron Other (See Comments)    hallucinations    . Zofran [Ondansetron Hcl] Other (See Comments)    Reaction:  hallucinations   . Codeine Nausea And Vomiting  . Gabapentin Other (See Comments)    Reaction:  Unknown    . Lac Bovis Nausea And Vomiting  . Losartan Nausea Only  . Oxycodone Anxiety  . Prochlorperazine Other (See Comments)    Reaction:  Unknown .  Patient does not remember reaction but she does have vertigo and anxiety along with n and v at times. Could be used to treat any of these   . Reglan [Metoclopramide] Other (See Comments)    Per patient her Dr. Evelina Bucy her off it   . Scopolamine Other (See Comments)    Dizziness, also has vertigo already  . Tape Rash  . Tapentadol Rash   DISCHARGE MEDICATIONS:   Allergies as of 02/15/2018      Reactions   Ace Inhibitors Swelling, Anaphylaxis   Ativan [lorazepam] Other (See Comments)   Reaction:Hallucinations and headaches   Compazine [prochlorperazine Edisylate] Anaphylaxis, Nausea And Vomiting, Other (See Comments)   Other reaction(s): dystonia from this vs. Reglan, 23 Jul - patient relates that she takes promethazine frequently with no problems   Sumatriptan Succinate Other (See Comments)   Other reaction(s): delirium and hallucinations per [facility] records   Dilaudid [hydromorphone Hcl] Other (See Comments)   Delirium   Ondansetron Other (See Comments)   hallucinations    Zofran [ondansetron Hcl] Other (See Comments)   Reaction:  hallucinations    Codeine Nausea And Vomiting   Gabapentin Other (See Comments)   Reaction:  Unknown    Lac Bovis Nausea And Vomiting   Losartan Nausea Only   Oxycodone Anxiety   Prochlorperazine Other (See Comments)   Reaction:  Unknown . Patient does not remember reaction but she does have vertigo and anxiety along with n and v at times. Could be used to treat any of these   Reglan [metoclopramide] Other (See Comments)   Per patient her Dr. Evelina Bucy her off it    Scopolamine Other (See Comments)   Dizziness, also has vertigo already   Tape Rash   Tapentadol Rash      Medication List    STOP taking these medications   acidophilus Caps capsule   carvedilol 25 MG tablet Commonly known as:  COREG   DEXILANT 60 MG capsule Generic drug:  dexlansoprazole   famotidine 20 MG tablet Commonly known as:  PEPCID   insulin detemir 100 UNIT/ML  injection Commonly known as:  LEVEMIR   LYRICA 75 MG capsule Generic drug:  pregabalin     TAKE these medications   albuterol (2.5 MG/3ML) 0.083% nebulizer solution Commonly known as:  PROVENTIL Take 2.5 mg by nebulization every 4 (four) hours as needed for wheezing or shortness of breath.   albuterol 108 (90 Base) MCG/ACT inhaler Commonly known as:  PROVENTIL HFA;VENTOLIN HFA Inhale 2 puffs into the lungs every 6 (six) hours as needed for wheezing or shortness of breath.   amLODipine 10 MG tablet Commonly known as:  NORVASC Take 10 mg by mouth daily.   amoxicillin-clavulanate 875-125 MG tablet Commonly known as:  AUGMENTIN Take 1 tablet by mouth 2 (two) times daily for 10 days.   aspirin EC 81 MG tablet Take 81 mg by mouth daily.  atorvastatin 20 MG tablet Commonly known as:  LIPITOR Take 20 mg by mouth every evening.   Biotin 1 MG Caps Take 1 mg by mouth daily.   cetirizine 10 MG tablet Commonly known as:  ZYRTEC Take 10 mg by mouth daily.   cholecalciferol 400 units Tabs tablet Commonly known as:  VITAMIN D Take 400 Units by mouth daily.   clopidogrel 75 MG tablet Commonly known as:  PLAVIX Take 1 tablet (75 mg total) by mouth daily.   cyanocobalamin 1000 MCG tablet Take 1,000 mcg by mouth daily.   diclofenac sodium 1 % Gel Commonly known as:  VOLTAREN Apply 1 application topically daily as needed (for rash).   ethyl chloride spray Apply 1 application topically 3 (three) times a week.   FLUoxetine 20 MG capsule Commonly known as:  PROZAC Take 1 capsule (20 mg total) by mouth daily. What changed:  when to take this   glucose 4 GM chewable tablet Chew 3-4 tablets by mouth as needed for low blood sugar (blood sugar less than 60).   HYDROcodone-acetaminophen 5-325 MG tablet Commonly known as:  NORCO/VICODIN Take 1 tablet by mouth every 6 (six) hours as needed for moderate pain or severe pain. What changed:  when to take this   hydrocortisone cream  0.5 % Apply topically 3 (three) times daily.   lipase/protease/amylase 12000 units Cpep capsule Commonly known as:  CREON Take 36000 mg in the morning, 24000 midday & 36000 in the evening   meclizine 25 MG tablet Commonly known as:  ANTIVERT Take 25 mg by mouth every 6 (six) hours as needed for dizziness.   multivitamin Tabs tablet Take 1 tablet by mouth daily.   nitroGLYCERIN 0.4 MG SL tablet Commonly known as:  NITROSTAT Place 0.4 mg under the tongue every 5 (five) minutes as needed for chest pain.   NOVOLOG FLEXPEN 100 UNIT/ML FlexPen Generic drug:  insulin aspart Inject 4-15 Units into the skin 4 (four) times daily as needed for high blood sugar (> 100, per sliding scale).   ranitidine 300 MG capsule Commonly known as:  ZANTAC Take 1 capsule by mouth every evening.   rifaximin 550 MG Tabs tablet Commonly known as:  XIFAXAN Take 550 mg by mouth 2 (two) times daily.   sevelamer carbonate 800 MG tablet Commonly known as:  RENVELA Take 800-2,400 mg by mouth See admin instructions. Take 2400 mg by mouth 3 times daily with meals and take 800 mg by mouth with snacks        DISCHARGE INSTRUCTIONS:   DIET:  Regular diet DISCHARGE CONDITION:  Good ACTIVITY:  Activity as tolerated OXYGEN:  Home Oxygen: No.  Oxygen Delivery: room air DISCHARGE LOCATION:  home with [facility]   If you experience worsening of your admission symptoms, develop shortness of breath, life threatening emergency, suicidal or homicidal thoughts you must seek medical attention immediately by calling 911 or calling your MD immediately  if symptoms less severe.  You Must read complete instructions/literature along with all the possible adverse reactions/side effects for all the Medicines you take and that have been prescribed to you. Take any new Medicines after you have completely understood and accpet all the possible adverse reactions/side effects.   Please note  You were cared for by a hospitalist  during your hospital stay. If you have any questions about your discharge medications or the care you received while you were in the hospital after you are discharged, you can call the unit and asked to speak  with the hospitalist on call if the hospitalist that took care of you is not available. Once you are discharged, your primary care physician will handle any further medical issues. Please note that NO REFILLS for any discharge medications will be authorized once you are discharged, as it is imperative that you return to your primary care physician (or establish a relationship with a primary care physician if you do not have one) for your aftercare needs so that they can reassess your need for medications and monitor your lab values.    On the day of Discharge:  VITAL SIGNS:  Blood pressure (!) 137/46, pulse 67, temperature 98.7 F (37.1 C), temperature source Oral, resp. rate 16, height 5' 5.5" (1.664 m), weight 80.4 kg (177 lb 4 oz), SpO2 95 %. PHYSICAL EXAMINATION:  GENERAL:  57 y.o.-year-old patient lying in the bed with no acute distress.  EYES: Pupils equal, round, reactive to light and accommodation. No scleral icterus. Extraocular muscles intact.  HEENT: Head atraumatic, normocephalic. Oropharynx and nasopharynx clear.  NECK:  Supple, no jugular venous distention. No thyroid enlargement, no tenderness.  LUNGS: Normal breath sounds bilaterally, no wheezing, rales,rhonchi or crepitation. No use of accessory muscles of respiration.  CARDIOVASCULAR: S1, S2 normal. No murmurs, rubs, or gallops.  ABDOMEN: Soft, non-tender, non-distended. Bowel sounds present. No organomegaly or mass.  EXTREMITIES: No pedal edema, cyanosis, or clubbing.  NEUROLOGIC: Cranial nerves II through XII are intact. Muscle strength 5/5 in all extremities. Sensation intact. Gait not checked.  PSYCHIATRIC: The patient is alert and oriented x 3.  SKIN: No obvious rash, lesion, or ulcer.  DATA REVIEW:   CBC Recent  Labs  Lab 02/17/18 1247  WBC 5.2  HGB 10.0*  HCT 30.5*  PLT 188    Chemistries  Recent Labs  Lab 02/12/18 0920  02/17/18 1247  NA 135   < > 139  K 4.6   < > 4.4  CL 95*   < > 98*  CO2 27   < > 26  GLUCOSE 69   < > 124*  BUN 41*   < > 21*  CREATININE 7.55*   < > 6.04*  CALCIUM 7.9*   < > 8.7*  AST 21  --   --   ALT 11*  --   --   ALKPHOS 100  --   --   BILITOT 0.6  --   --    < > = values in this interval not displayed.    Follow-up Information    Freddy Finner, NP. Schedule an appointment as soon as possible for a visit in 5 day(s).   Specialty:  Nurse Practitioner Contact information: [place] [address] [phone]           She remains at very high risk for Readmissions. Please note she has had 4 readmissions and 6 ED visits in last 6 months already.   Management plans discussed with the patient, nursing and they are in agreement.  CODE STATUS: Prior   TOTAL TIME TAKING CARE OF THIS PATIENT: 45 minutes.    Max Sane M.D on 02/18/2018 at 8:48 AM  Between 7am to 6pm - Pager - [phone]  After 6pm go to [email or web address] - Proofreader  [facility]  Office  [phone]  CC: Primary care physician; Freddy Finner, NP   Note: This dictation was prepared with Dragon dictation along with smaller phrase technology. Any transcriptional errors that result from this process are  unintentional. 

## 2018-02-18 NOTE — Telephone Encounter (Signed)
EMMI Follow-up: Received call from [name] as she received a call from my number.  Explained our new process with automated calls post discharge and asked if she had any concerns, if Rxs filled and any questions about making follow-up appointments. She had no questions and said she was waiting for [facility] to call her with time they would be coming out today.  No needs noted.

## 2018-02-19 ENCOUNTER — Ambulatory Visit
Admission: RE | Admit: 2018-02-19 | Discharge: 2018-02-19 | Disposition: A | Discharge status: Home / Self Care | Payer: Medicare Other | Source: Ambulatory Visit | Attending: Urology | Admitting: Urology

## 2018-02-19 DIAGNOSIS — N2889 Other specified disorders of kidney and ureter: Secondary | ICD-10-CM

## 2018-02-20 ENCOUNTER — Emergency Department
Admission: EM | Admit: 2018-02-20 | Discharge: 2018-02-20 | Disposition: A | Discharge status: Home / Self Care | Payer: Medicare Other | Attending: Emergency Medicine | Admitting: Emergency Medicine

## 2018-02-20 ENCOUNTER — Emergency Department: Payer: Medicare Other

## 2018-02-20 ENCOUNTER — Other Ambulatory Visit: Payer: Self-pay

## 2018-02-20 ENCOUNTER — Encounter: Payer: Self-pay | Admitting: Emergency Medicine

## 2018-02-20 DIAGNOSIS — Z794 Long term (current) use of insulin: Secondary | ICD-10-CM | POA: Insufficient documentation

## 2018-02-20 DIAGNOSIS — E1140 Type 2 diabetes mellitus with diabetic neuropathy, unspecified: Secondary | ICD-10-CM | POA: Diagnosis not present

## 2018-02-20 DIAGNOSIS — E871 Hypo-osmolality and hyponatremia: Secondary | ICD-10-CM | POA: Insufficient documentation

## 2018-02-20 DIAGNOSIS — F1210 Cannabis abuse, uncomplicated: Secondary | ICD-10-CM | POA: Diagnosis not present

## 2018-02-20 DIAGNOSIS — R0602 Shortness of breath: Secondary | ICD-10-CM | POA: Diagnosis not present

## 2018-02-20 DIAGNOSIS — Z992 Dependence on renal dialysis: Secondary | ICD-10-CM | POA: Diagnosis not present

## 2018-02-20 DIAGNOSIS — Z87891 Personal history of nicotine dependence: Secondary | ICD-10-CM | POA: Insufficient documentation

## 2018-02-20 DIAGNOSIS — Z8673 Personal history of transient ischemic attack (TIA), and cerebral infarction without residual deficits: Secondary | ICD-10-CM | POA: Insufficient documentation

## 2018-02-20 DIAGNOSIS — I132 Hypertensive heart and chronic kidney disease with heart failure and with stage 5 chronic kidney disease, or end stage renal disease: Secondary | ICD-10-CM | POA: Diagnosis not present

## 2018-02-20 DIAGNOSIS — I2510 Atherosclerotic heart disease of native coronary artery without angina pectoris: Secondary | ICD-10-CM | POA: Insufficient documentation

## 2018-02-20 DIAGNOSIS — E1122 Type 2 diabetes mellitus with diabetic chronic kidney disease: Secondary | ICD-10-CM | POA: Diagnosis not present

## 2018-02-20 DIAGNOSIS — R0789 Other chest pain: Secondary | ICD-10-CM | POA: Diagnosis not present

## 2018-02-20 DIAGNOSIS — I252 Old myocardial infarction: Secondary | ICD-10-CM | POA: Insufficient documentation

## 2018-02-20 DIAGNOSIS — J45909 Unspecified asthma, uncomplicated: Secondary | ICD-10-CM | POA: Insufficient documentation

## 2018-02-20 DIAGNOSIS — Z79899 Other long term (current) drug therapy: Secondary | ICD-10-CM | POA: Diagnosis not present

## 2018-02-20 DIAGNOSIS — Z85528 Personal history of other malignant neoplasm of kidney: Secondary | ICD-10-CM | POA: Diagnosis not present

## 2018-02-20 DIAGNOSIS — R05 Cough: Secondary | ICD-10-CM | POA: Diagnosis not present

## 2018-02-20 DIAGNOSIS — N186 End stage renal disease: Secondary | ICD-10-CM | POA: Diagnosis not present

## 2018-02-20 DIAGNOSIS — Z7982 Long term (current) use of aspirin: Secondary | ICD-10-CM | POA: Insufficient documentation

## 2018-02-20 DIAGNOSIS — I5032 Chronic diastolic (congestive) heart failure: Secondary | ICD-10-CM | POA: Insufficient documentation

## 2018-02-20 DIAGNOSIS — Z7902 Long term (current) use of antithrombotics/antiplatelets: Secondary | ICD-10-CM | POA: Insufficient documentation

## 2018-02-20 DIAGNOSIS — R079 Chest pain, unspecified: Secondary | ICD-10-CM | POA: Diagnosis present

## 2018-02-20 LAB — COMPREHENSIVE METABOLIC PANEL
ALT: 10 U/L — ABNORMAL LOW (ref 14–54)
AST: 20 U/L (ref 15–41)
Albumin: 3.5 g/dL (ref 3.5–5.0)
Alkaline Phosphatase: 110 U/L (ref 38–126)
Anion gap: 13 (ref 5–15)
BUN: 16 mg/dL (ref 6–20)
CO2: 27 mmol/L (ref 22–32)
Calcium: 8.4 mg/dL — ABNORMAL LOW (ref 8.9–10.3)
Chloride: 90 mmol/L — ABNORMAL LOW (ref 101–111)
Creatinine, Ser: 5.37 mg/dL — ABNORMAL HIGH (ref 0.44–1.00)
GFR calc Af Amer: 9 mL/min — ABNORMAL LOW (ref >60)
GFR calc non Af Amer: 8 mL/min — ABNORMAL LOW (ref >60)
Glucose, Bld: 170 mg/dL — ABNORMAL HIGH (ref 65–99)
Potassium: 3.7 mmol/L (ref 3.5–5.1)
Sodium: 130 mmol/L — ABNORMAL LOW (ref 135–145)
Total Bilirubin: 1.2 mg/dL (ref 0.3–1.2)
Total Protein: 8.3 g/dL — ABNORMAL HIGH (ref 6.5–8.1)

## 2018-02-20 LAB — CBC WITH DIFFERENTIAL/PLATELET
Basophils Absolute: 0 10*3/uL (ref 0–0.1)
Basophils Relative: 1 %
Eosinophils Absolute: 0.4 10*3/uL (ref 0–0.7)
Eosinophils Relative: 8 %
HCT: 34.6 % — ABNORMAL LOW (ref 35.0–47.0)
Hemoglobin: 11.3 g/dL — ABNORMAL LOW (ref 12.0–16.0)
Lymphocytes Relative: 27 %
Lymphs Abs: 1.3 10*3/uL (ref 1.0–3.6)
MCH: 30.2 pg (ref 26.0–34.0)
MCHC: 32.6 g/dL (ref 32.0–36.0)
MCV: 92.6 fL (ref 80.0–100.0)
Monocytes Absolute: 0.5 10*3/uL (ref 0.2–0.9)
Monocytes Relative: 12 %
Neutro Abs: 2.4 10*3/uL (ref 1.4–6.5)
Neutrophils Relative %: 52 %
Platelets: 235 10*3/uL (ref 150–440)
RBC: 3.74 MIL/uL — ABNORMAL LOW (ref 3.80–5.20)
RDW: 18 % — ABNORMAL HIGH (ref 11.5–14.5)
WBC: 4.6 10*3/uL (ref 3.6–11.0)

## 2018-02-20 LAB — TROPONIN I: Troponin I: <0.03 ng/mL (ref <0.03)

## 2018-02-20 IMAGING — DX DG CHEST 1V PORT
1 series · 1 of 1 positions shown · non-contrast
Comparison: 05/05/2017

CLINICAL DATA: Chest pain

EXAM:
PORTABLE CHEST 1 VIEW

[chest ap]
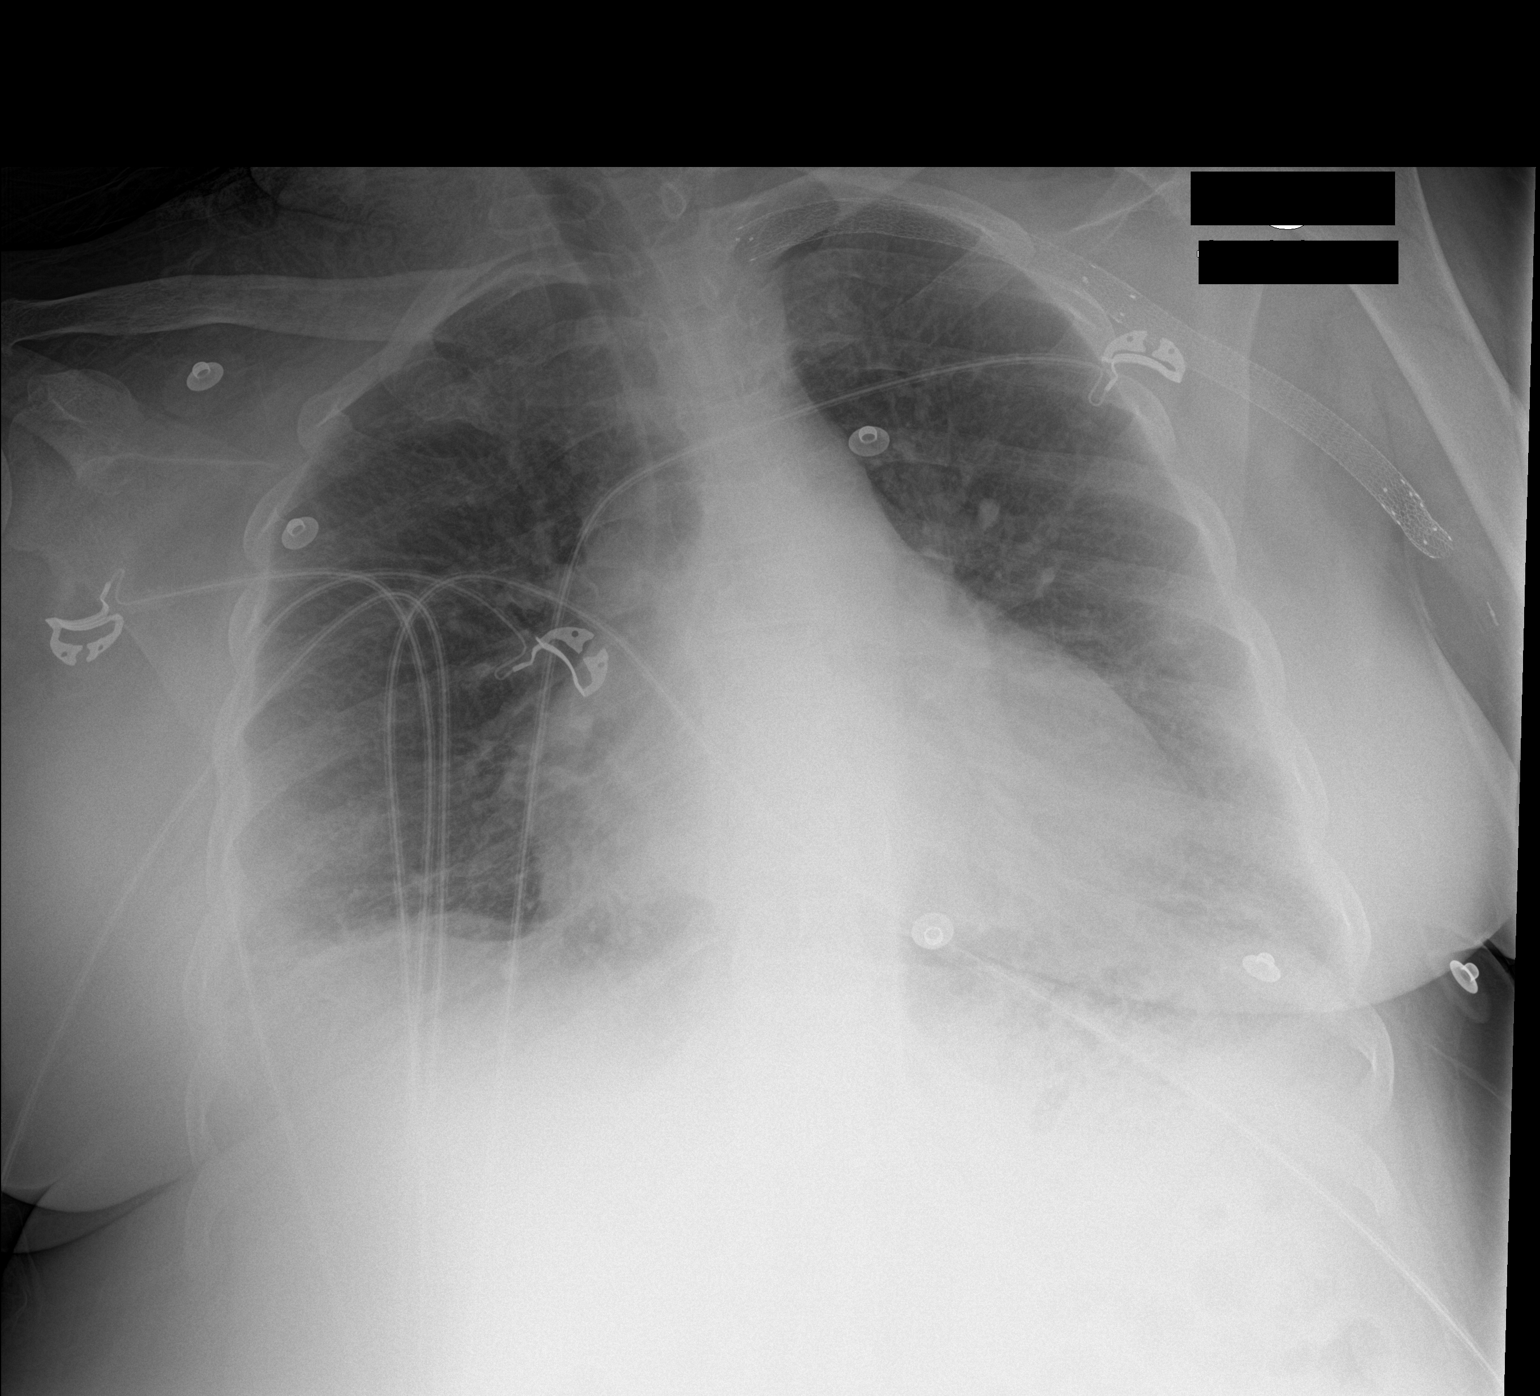

[1 of 1 positions shown; findings below may reference images not displayed]

FINDINGS: Cardiomegaly. Bibasilar atelectasis. No overt edema. No effusions or
acute bony abnormality.
IMPRESSION: Cardiomegaly with bibasilar atelectasis. No overt failure currently.

## 2018-02-20 MED ORDER — LIDOCAINE 5 % EX PTCH: 1.0000 | MEDICATED_PATCH | Freq: Two times a day (BID) | CUTANEOUS | 0 refills | Status: AC

## 2018-02-20 MED ORDER — ACETAMINOPHEN 500 MG PO TABS
1000.0000 mg | ORAL_TABLET | Freq: Once | ORAL | Status: AC
  Administered 2018-02-20: 1000 mg via ORAL
  Filled 2018-02-20: qty 2

## 2018-02-20 NOTE — ED Triage Notes (Signed)
Pt to ED via EMS from home c/o back pain to the left x3 days and worst today, patient goes to dialysis MWF and went yesterday.  States green productive cough.

## 2018-02-20 NOTE — ED Notes (Addendum)
Pt c/o L sided back pain that is worse with deep inspiration and and palpation. Pt states had dialysis yesterday. Pt also c/o productive cough with green sputum. Pt repeatedly moaning out and asking for help when speaking with staff and while in the hallway.

## 2018-02-20 NOTE — Discharge Instructions (Addendum)
Please seek medical attention for any high fevers, chest pain, shortness of breath, change in behavior, persistent vomiting, bloody stool or any other new or concerning symptoms.  

## 2018-02-20 NOTE — ED Notes (Signed)
MD to bedside at this time 

## 2018-02-20 NOTE — ED Notes (Signed)
NAD noted at time of D/C. Pt denies questions or concerns. Pt taken to the lobby via wheelchair at this time.  

## 2018-02-20 NOTE — ED Provider Notes (Signed)
[facility] Emergency Department Provider Note   ____________________________________________   I have reviewed the triage vital signs and the nursing notes.   HISTORY  Chief Complaint Back Pain and Shortness of Breath   History limited by: Not Limited   HPI [name] is a 57 y.o. female who presents to the emergency department today because of concerns for chest pain.  Is located in the left lower chest.  Patient states that it started again today.  She has however been having issues with this pain.  It has been accompanied by shortness of breath.  She has had cough.  She denies any fevers.   Per medical record review patient has a history of COPD, ED visit 3 days ago for similar symptoms.  Past Medical History:  Diagnosis Date  . Anemia   . Anginal pain ([place])   . Anxiety   . Arthritis   . Asthma   . Broken wrist   . Bronchitis   . chronic diastolic CHF 1/60/7371  . COPD (chronic obstructive pulmonary disease) ([place])   . Coronary artery disease    a. cath 2013: stenting to RCA (report not available); b. cath 2014: LM nl, pLAD 40%, mLAD nl, ost LCx 40%, mid LCx nl, pRCA 30% @ site of prior stent, mRCA 50%  . Depression   . Diabetes mellitus without complication ([place])   . Diabetic neuropathy ([place])   . dialysis 2006  . Diverticulosis   . Dizziness   . Dyspnea   . Elevated lipids   . Environmental and seasonal allergies   . ESRD (end stage renal disease) on dialysis ([place])    M-W-F  . GERD (gastroesophageal reflux disease)   . Headache   . History of hiatal hernia   . HOH (hard of hearing)   . Hx of pancreatitis 2015  . Hypertension   . Lower extremity edema   . Mitral regurgitation    a. echo 10/2013: EF 62%, noWMA, mildly dilated LA, mild to mod MR/TR, GR1DD  . Myocardial infarction ([place])   . Orthopnea   . Pneumonia   . Renal cancer ([place])   . Renal insufficiency    Pt is on dialysis on M,W + F.  . Wheezing     Patient Active  Problem List   Diagnosis Date Noted  . Gastroparesis due to DM ([place]) 01/18/2018  . Complication of vascular access for dialysis 12/04/2017  . Osteomyelitis ([place]) 09/30/2017  . Carotid stenosis 06/18/2017  . Shortness of breath 05/04/2017  . Cellulitis of lower extremity 07/29/2016  . Chronic venous insufficiency 07/29/2016  . Lymphedema 07/29/2016  . TIA (transient ischemic attack) 04/21/2016  . Altered mental status 04/08/2016  . Hyperammonemia ([place]) 04/08/2016  . Elevated troponin 04/08/2016  . Depression 04/08/2016  . Depression, major, recurrent, severe with psychosis ([place]) 04/08/2016  . Blood in stool   . Intractable cyclical vomiting with nausea   . Reflux esophagitis   . Gastritis   . Generalized abdominal pain   . Uncontrollable vomiting   . Major depressive disorder, recurrent episode, moderate ([place]) 03/15/2016  . Adjustment disorder with mixed anxiety and depressed mood 03/15/2016  . Malnutrition of moderate degree 12/01/2015  . Renal mass   . Dyspnea   . Acute renal failure ([place])   . Respiratory failure ([place])   . High temperature 11/14/2015  . Pulmonary edema   . Encounter for central line placement   . Encounter for orogastric (OG) tube placement   .  Nausea 11/12/2015  . Hyperkalemia 10/03/2015  . Diarrhea, unspecified 07/22/2015  . Pneumonia 05/21/2015  . Hypoglycemia 04/24/2015  . Unresponsiveness 04/24/2015  . Bradycardia 04/24/2015  . Hypothermia 04/24/2015  . Acute respiratory failure ([place]) 04/24/2015  . Acute diastolic CHF (congestive heart failure) ([place]) 04/05/2015  . Diabetic gastroparesis ([place]) 04/05/2015  . Hypokalemia 04/05/2015  . Generalized weakness 04/05/2015  . Acute pulmonary edema ([place]) 04/03/2015  . Nausea and vomiting 04/03/2015  . Hypoglycemia associated with diabetes ([place]) 04/03/2015  . Anemia of chronic disease 04/03/2015  . Secondary hyperparathyroidism ([place]) 04/03/2015  . Pressure ulcer 04/02/2015  . Acute respiratory failure  with hypoxia ([place]) 04/01/2015  . Adjustment disorder with anxiety 03/14/2015  . Somatic symptom disorder, mild 03/08/2015  . Coronary artery disease involving native coronary artery of native heart without angina pectoris   . Nausea & vomiting 03/06/2015  . Abdominal pain 03/06/2015  . DM (diabetes mellitus) ([place]) 03/06/2015  . HTN (hypertension) 03/06/2015  . Gastroparesis 02/24/2015  . Pleural effusion 02/19/2015  . HCAP (healthcare-associated pneumonia) 02/19/2015  . End-stage renal disease on hemodialysis ([place]) 02/19/2015    Past Surgical History:  Procedure Laterality Date  . A/V SHUNTOGRAM Left 01/20/2018   Procedure: A/V SHUNTOGRAM;  Surgeon: Algernon Huxley, MD;  Location: [place] [facility];  Service: Cardiovascular;  Laterality: Left;  . ABDOMINAL HYSTERECTOMY  1992  . AMPUTATION TOE Left 10/02/2017   Procedure: AMPUTATION TOE-LEFT GREAT TOE;  Surgeon: Albertine Patricia, DPM;  Location: [facility];  Service: Podiatry;  Laterality: Left;  . APPENDECTOMY    . CARDIAC CATHETERIZATION Left 07/26/2015   Procedure: Left Heart Cath and Coronary Angiography;  Surgeon: Dionisio David, MD;  Location: [name] [facility];  Service: Cardiovascular;  Laterality: Left;  . CATARACT EXTRACTION W/ INTRAOCULAR LENS IMPLANT Right   . CATARACT EXTRACTION W/PHACO Left 03/10/2017   Procedure: CATARACT EXTRACTION PHACO AND INTRAOCULAR LENS PLACEMENT (IOC);  Surgeon: Birder Robson, MD;  Location: [facility];  Service: Ophthalmology;  Laterality: Left;  [place] 00:51.9 AP% 14.2 CDE 7.39 fluid pack lot # [id number]  . CHOLECYSTECTOMY    . COLONOSCOPY WITH PROPOFOL N/A 08/12/2016   Procedure: COLONOSCOPY WITH PROPOFOL;  Surgeon: Lollie Sails, MD;  Location: [facility] ENDOSCOPY;  Service: Endoscopy;  Laterality: N/A;  . DIALYSIS FISTULA CREATION Left    upper arm  . dialysis grafts    . ESOPHAGOGASTRODUODENOSCOPY N/A 03/08/2015   Procedure: ESOPHAGOGASTRODUODENOSCOPY (EGD);  Surgeon: Manya Silvas, MD;   Location: [facility] ENDOSCOPY;  Service: Endoscopy;  Laterality: N/A;  . ESOPHAGOGASTRODUODENOSCOPY (EGD) WITH PROPOFOL N/A 03/18/2016   Procedure: ESOPHAGOGASTRODUODENOSCOPY (EGD) WITH PROPOFOL;  Surgeon: [name], MD;  Location: [facility] ENDOSCOPY;  Service: Endoscopy;  Laterality: N/A;  . EYE SURGERY Right 2018  . FECAL TRANSPLANT N/A 08/23/2015   Procedure: FECAL TRANSPLANT;  Surgeon: Manya Silvas, MD;  Location: [facility] ENDOSCOPY;  Service: Endoscopy;  Laterality: N/A;  . HAND SURGERY Bilateral   . PERIPHERAL VASCULAR CATHETERIZATION N/A 12/20/2015   Procedure: Thrombectomy of dialysis access versus permcath placement;  Surgeon: Algernon Huxley, MD;  Location: [place] [facility];  Service: Cardiovascular;  Laterality: N/A;  . PERIPHERAL VASCULAR CATHETERIZATION N/A 12/20/2015   Procedure: A/V Shunt Intervention;  Surgeon: Algernon Huxley, MD;  Location: [place] [facility];  Service: Cardiovascular;  Laterality: N/A;  . PERIPHERAL VASCULAR CATHETERIZATION N/A 12/20/2015   Procedure: A/V Shuntogram/Fistulagram;  Surgeon: Algernon Huxley, MD;  Location: [place] [facility];  Service: Cardiovascular;  Laterality: N/A;  .  PERIPHERAL VASCULAR CATHETERIZATION N/A 01/02/2016   Procedure: A/V Shuntogram/Fistulagram;  Surgeon: Algernon Huxley, MD;  Location: [place] [facility];  Service: Cardiovascular;  Laterality: N/A;  . PERIPHERAL VASCULAR CATHETERIZATION N/A 01/02/2016   Procedure: A/V Shunt Intervention;  Surgeon: Algernon Huxley, MD;  Location: [place] [facility];  Service: Cardiovascular;  Laterality: N/A;    Prior to Admission medications   Medication Sig Start Date End Date Taking? Authorizing Provider  albuterol (PROVENTIL HFA;VENTOLIN HFA) 108 (90 Base) MCG/ACT inhaler Inhale 2 puffs into the lungs every 6 (six) hours as needed for wheezing or shortness of breath.     [provider]  albuterol (PROVENTIL HFA;VENTOLIN HFA) 108 (90 Base) MCG/ACT inhaler Inhale 2 puffs into the lungs every 6 (six) hours  as needed for wheezing or shortness of breath. 02/17/18   Merlyn Lot, MD  albuterol (PROVENTIL) (2.5 MG/3ML) 0.083% nebulizer solution Take 2.5 mg by nebulization every 4 (four) hours as needed for wheezing or shortness of breath.    [provider]  amLODipine (NORVASC) 10 MG tablet Take 10 mg by mouth daily.    [provider]  amoxicillin-clavulanate (AUGMENTIN) 875-125 MG tablet Take 1 tablet by mouth 2 (two) times daily for 10 days. 02/15/18 02/25/18  Max Sane, MD  aspirin EC 81 MG tablet Take 81 mg by mouth daily. 09/08/16   [provider]  atorvastatin (LIPITOR) 20 MG tablet Take 20 mg by mouth every evening.  12/02/16   [provider]  Biotin 1 MG CAPS Take 1 mg by mouth daily.    [provider]  cetirizine (ZYRTEC) 10 MG tablet Take 10 mg by mouth daily.     [provider]  cholecalciferol (VITAMIN D) 400 units TABS tablet Take 400 Units by mouth daily.    [provider]  clopidogrel (PLAVIX) 75 MG tablet Take 1 tablet (75 mg total) by mouth daily. 03/25/17 03/25/18  Dustin Flock, MD  cyanocobalamin 1000 MCG tablet Take 1,000 mcg by mouth daily.     [provider]  diclofenac sodium (VOLTAREN) 1 % GEL Apply 1 application topically daily as needed (for rash).    [provider]  ethyl chloride spray Apply 1 application topically 3 (three) times a week. 01/18/18   [provider]  FLUoxetine (PROZAC) 20 MG capsule Take 1 capsule (20 mg total) by mouth daily. Patient taking differently: Take 20 mg by mouth at bedtime.  04/10/16   Hower, Aaron Mose, MD  glucose 4 GM chewable tablet Chew 3-4 tablets by mouth as needed for low blood sugar (blood sugar less than 60).     [provider]  HYDROcodone-acetaminophen (NORCO/VICODIN) 5-325 MG tablet Take 1 tablet by mouth every 6 (six) hours as needed for moderate pain or severe pain. Patient taking differently: Take 1 tablet by mouth 3 (three) times  daily as needed for moderate pain or severe pain.  05/05/17   Gouru, Illene Silver, MD  hydrocortisone cream 0.5 % Apply topically 3 (three) times daily. 01/21/18   Wieting, Richard, MD  insulin aspart (NOVOLOG FLEXPEN) 100 UNIT/ML FlexPen Inject 4-15 Units into the skin 4 (four) times daily as needed for high blood sugar (> 100, per sliding scale).    [provider]  lipase/protease/amylase (CREON) 12000 UNITS CPEP capsule Take 36000 mg in the morning, 24000 midday & 36000 in the evening    [provider]  meclizine (ANTIVERT) 25 MG tablet Take 25 mg by mouth every 6 (six) hours  as needed for dizziness.    [provider]  multivitamin (RENA-VIT) TABS tablet Take 1 tablet by mouth daily.     [provider]  nitroGLYCERIN (NITROSTAT) 0.4 MG SL tablet Place 0.4 mg under the tongue every 5 (five) minutes as needed for chest pain.     [provider]  ranitidine (ZANTAC) 300 MG capsule Take 1 capsule by mouth every evening. 01/19/18   [provider]  rifaximin (XIFAXAN) 550 MG TABS tablet Take 550 mg by mouth 2 (two) times daily.    [provider]  sevelamer carbonate (RENVELA) 800 MG tablet Take 800-2,400 mg by mouth See admin instructions. Take 2400 mg by mouth 3 times daily with meals and take 800 mg by mouth with snacks    [provider]    Allergies Ace inhibitors; Ativan [lorazepam]; Compazine [prochlorperazine edisylate]; Sumatriptan succinate; Dilaudid [hydromorphone hcl]; Ondansetron; Zofran [ondansetron hcl]; Codeine; Gabapentin; [place]; Losartan; Oxycodone; Prochlorperazine; Reglan [metoclopramide]; Scopolamine; Tape; and Tapentadol  Family History  Problem Relation Age of Onset  . Kidney disease Mother   . Diabetes Mother   . Cancer Father   . Kidney disease Sister     Social History Social History   Tobacco Use  . Smoking status: Former Smoker    Packs/day: 0.50    Years: 40.00    Pack years: 20.00    Types:  Cigarettes    Last attempt to quit: 02/13/2015    Years since quitting: 3.0  . Smokeless tobacco: Never Used  Substance Use Topics  . Alcohol use: Not Currently    Comment: glass wine week per pt  . Drug use: Yes    Types: Marijuana    Comment: once a day    Review of Systems Constitutional: No fever/chills Eyes: No visual changes. ENT: No sore throat. Cardiovascular: Positive for left sided chest pain. Respiratory: Positive for cough.  Gastrointestinal: No abdominal pain.  No nausea, no vomiting.  No diarrhea.   Genitourinary: Negative for dysuria. Musculoskeletal: Negative for back pain. Skin: Negative for rash. Neurological: Negative for headaches, focal weakness or numbness.  ____________________________________________   PHYSICAL EXAM:  VITAL SIGNS: ED Triage Vitals  Enc Vitals Group     BP 02/20/18 1209 (!) 177/70     Pulse Rate 02/20/18 1209 69     Resp 02/20/18 1209 16     Temp 02/20/18 1209 98 F (36.7 C)     Temp Source 02/20/18 1209 Oral     SpO2 02/20/18 1209 95 %     Weight 02/20/18 1209 170 lb (77.1 kg)     Height 02/20/18 1209 5\' 5"  (1.651 m)     Head Circumference --      Peak Flow --      Pain Score 02/20/18 1217 10   Constitutional: Alert and oriented.  Eyes: Conjunctivae are normal.  ENT      Head: Normocephalic and atraumatic.      Nose: No congestion/rhinnorhea.      Mouth/Throat: Mucous membranes are moist.      Neck: No stridor. Hematological/Lymphatic/Immunilogical: No cervical lymphadenopathy. Cardiovascular: Normal rate, regular rhythm.  No murmurs, rubs, or gallops.  Respiratory: Normal respiratory effort without tachypnea nor retractions. Breath sounds are clear and equal bilaterally. No wheezes/rales/rhonchi. Gastrointestinal: Soft and non tender. No rebound. No guarding.  Genitourinary: Deferred Musculoskeletal: Normal range of motion in all extremities. No lower extremity edema. Tender to palpation of the left chest wall.   Neurologic:  Normal speech and language.  No gross focal neurologic deficits are appreciated.  Skin:  Skin is warm, dry and intact. No rash noted. Psychiatric: Mood and affect are normal. Speech and behavior are normal. Patient exhibits appropriate insight and judgment.  ____________________________________________    LABS (pertinent positives/negatives)  Cbc wbc 4.6, hgb 11.3, plt 235 CMP na 130, k 3.7, cl 90, cr 5.37  ____________________________________________   EKG  None  ____________________________________________    RADIOLOGY  CXr No acute disease  ____________________________________________   PROCEDURES  Procedures  ____________________________________________   INITIAL IMPRESSION / ASSESSMENT AND PLAN / ED COURSE  Pertinent labs & imaging results that were available during my care of the patient were reviewed by me and considered in my medical decision making (see chart for details).   Patient presented to the emergency department today because of concerns for left chest pain and some cough.  On exam patient is somewhat tender over the left chest wall.  There is no rash overlying that area.  Patient's x-ray again does not show any pneumonia.  Had a similar presentation 3 days ago.  At this point given lack of concerning x-ray findings will plan on discharging home.  Discussed importance of follow-up.  ____________________________________________   FINAL CLINICAL IMPRESSION(S) / ED DIAGNOSES  Final diagnoses:  Chest wall pain  Hyponatremia     Note: This dictation was prepared with Dragon dictation. Any transcriptional errors that result from this process are unintentional     Nance Pear, MD 02/20/18 2028

## 2018-02-20 NOTE — ED Notes (Signed)
Pt repeatedly calling out due to pain in hallway. MD aware and to bedside to assess prior to this RN administering Tylenol per order. No new orders received. Will continue to monitor for further patient needs.

## 2018-02-23 ENCOUNTER — Ambulatory Visit: Admission: RE | Admit: 2018-02-23 | Payer: Medicare Other | Source: Ambulatory Visit

## 2018-02-28 IMAGING — CR DG CHEST 2V
2 series · 2 of 2 positions shown · non-contrast
Comparison: 10/15/2017.

CLINICAL DATA: RIGHT upper quadrant pain.  History of COPD.

EXAM:
CHEST  2 VIEW

[chest pa]
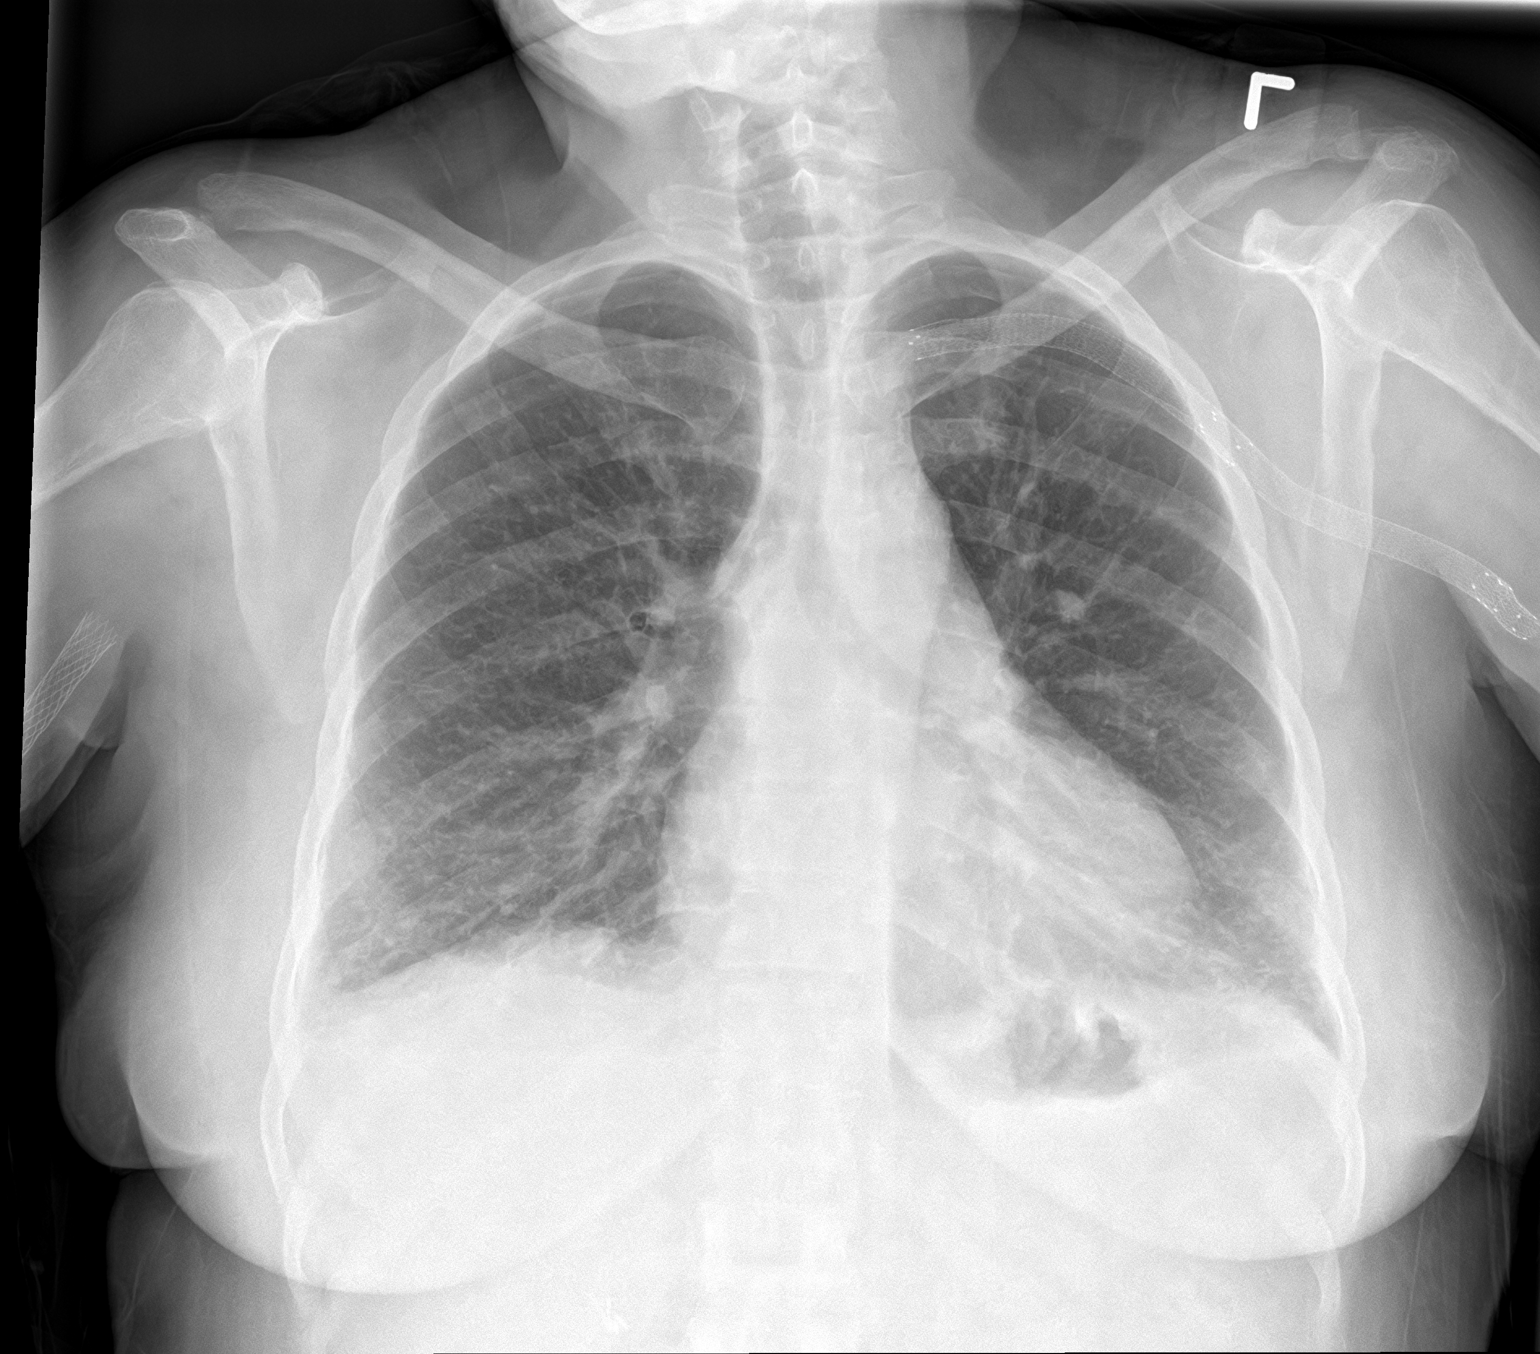

[chest lat]
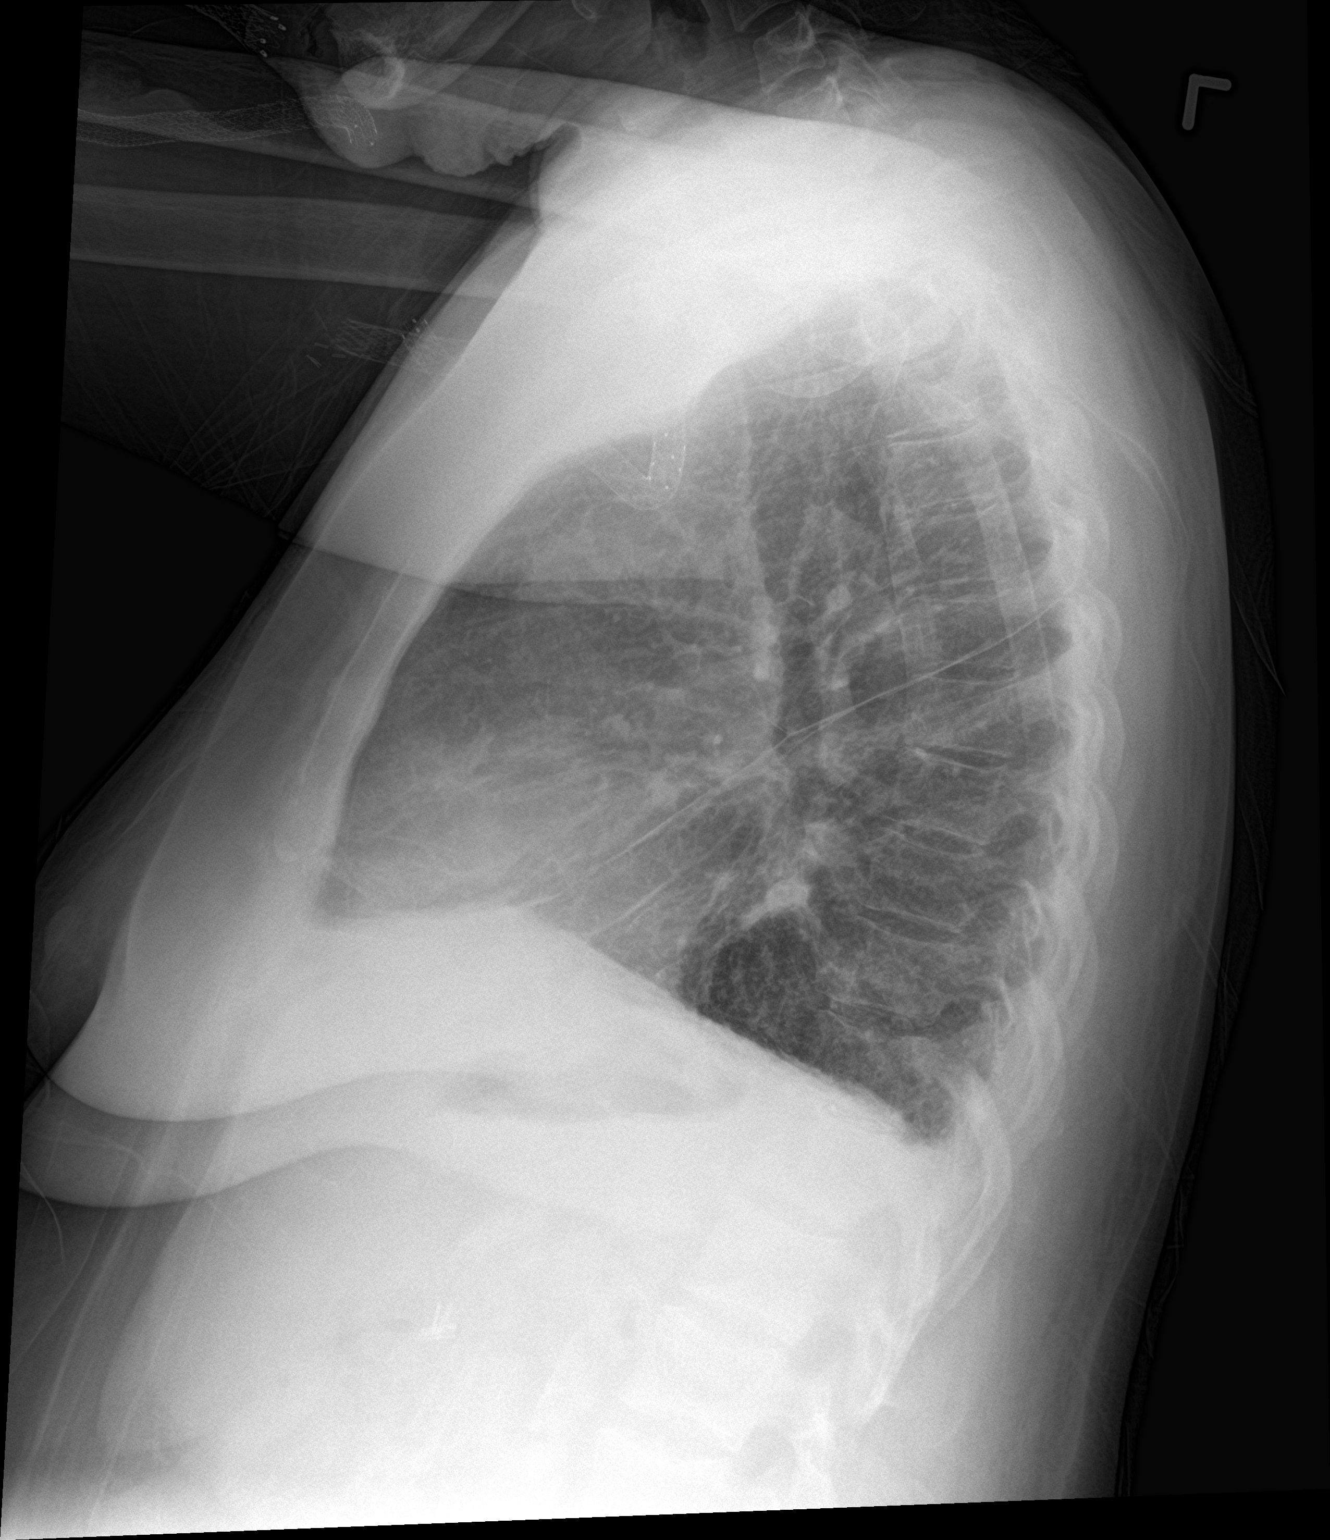

[2 of 2 positions shown; findings below may reference images not displayed]

FINDINGS: Cardiac size upper limits normal. No consolidation or edema. Mild
vascular congestion. RIGHT pleural effusion. No pneumothorax. No
acute osseous finding.
IMPRESSION: Improved cardiac size, upper limits normal. Small RIGHT effusion. No
definite consolidation.

## 2018-02-28 IMAGING — CR DG ABDOMEN 1V
2 series · 2 of 2 positions shown · non-contrast
Comparison: None.

CLINICAL DATA: Right flank pain.

EXAM:
ABDOMEN - 1 VIEW

[abdomen kub (1 of 2)]
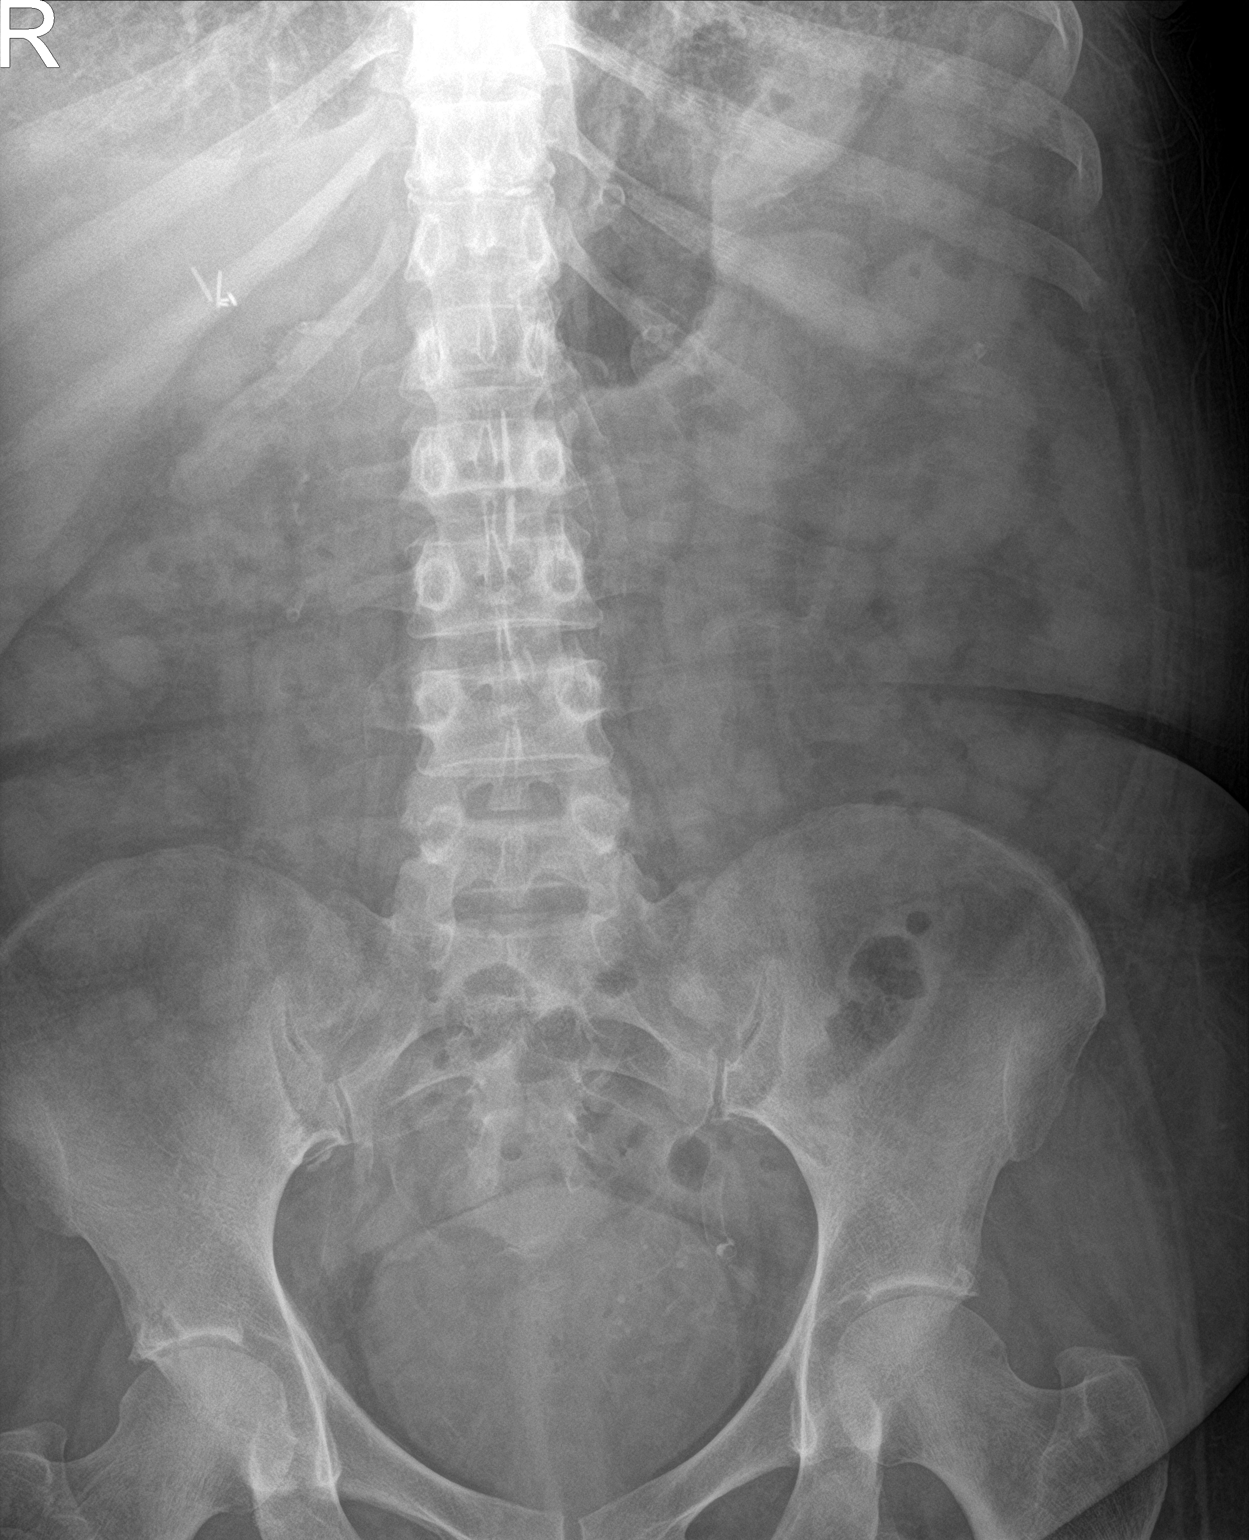

[abdomen kub (2 of 2)]
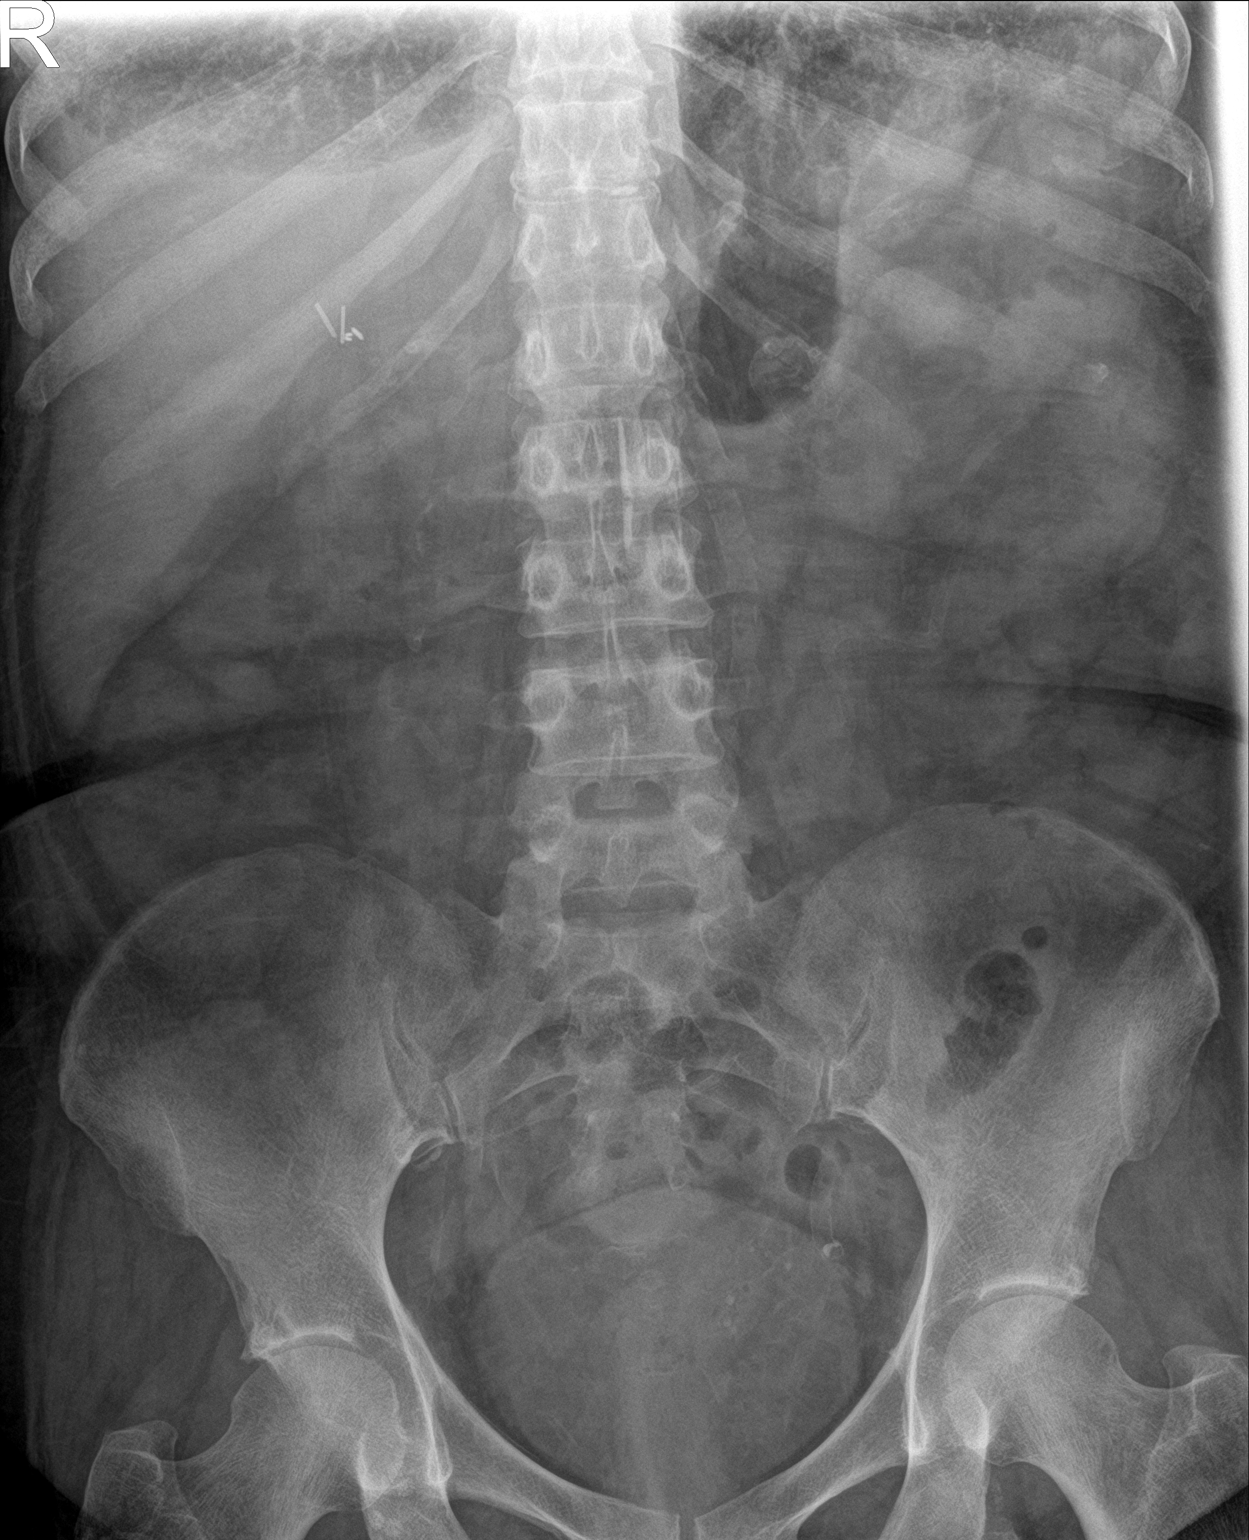

[2 of 2 positions shown; findings below may reference images not displayed]

FINDINGS: The bowel gas pattern is normal. No radio-opaque calculi. Vascular
calcifications noted.
IMPRESSION: No radiopaque renal calculi seen.

Nonobstructive bowel gas pattern.

## 2018-03-02 ENCOUNTER — Ambulatory Visit
Admission: RE | Admit: 2018-03-02 | Discharge: 2018-03-02 | Disposition: A | Discharge status: Home / Self Care | Payer: Medicare Other | Source: Ambulatory Visit | Attending: Urology | Admitting: Urology

## 2018-03-02 DIAGNOSIS — N2889 Other specified disorders of kidney and ureter: Secondary | ICD-10-CM | POA: Insufficient documentation

## 2018-03-02 DIAGNOSIS — K5730 Diverticulosis of large intestine without perforation or abscess without bleeding: Secondary | ICD-10-CM | POA: Diagnosis not present

## 2018-03-02 DIAGNOSIS — N261 Atrophy of kidney (terminal): Secondary | ICD-10-CM | POA: Insufficient documentation

## 2018-03-02 MED ORDER — IOPAMIDOL (ISOVUE-300) INJECTION 61%
100.0000 mL | Freq: Once | INTRAVENOUS | Status: AC | PRN
  Administered 2018-03-02: 100 mL via INTRAVENOUS

## 2018-03-04 IMAGING — CT CT ABD-PELV W/O CM
2 of 4 series · 17 of 46 positions shown, 19 images · non-contrast
Comparison: 09/03/2017

CLINICAL DATA: Nausea, vomiting.  End-stage renal disease.

EXAM:
CT ABDOMEN AND PELVIS WITHOUT CONTRAST
TECHNIQUE: Multidetector CT imaging of the abdomen and pelvis was performed
following the standard protocol without IV contrast.

[Series 2: routine abd/pel wo · axial · 0.93mm/px · z∈[-1010,-610]mm · 14 of 88 slices shown, 16 images]
[im 4/88  soft-tissue]
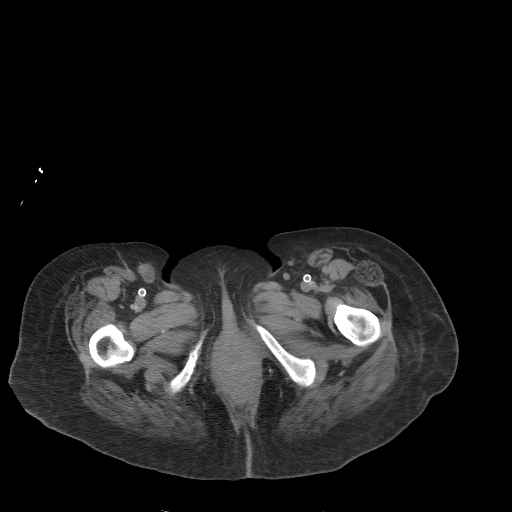
[im 4/88  bone]
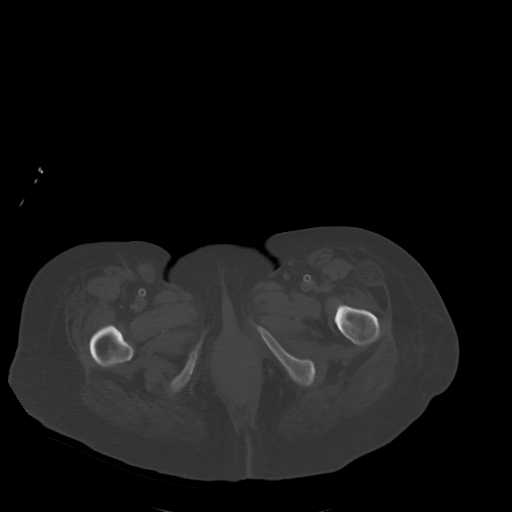
[im 11/88  soft-tissue]
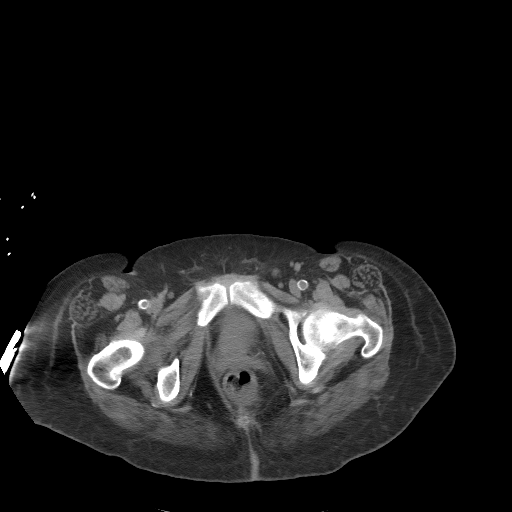
[im 18/88  soft-tissue]
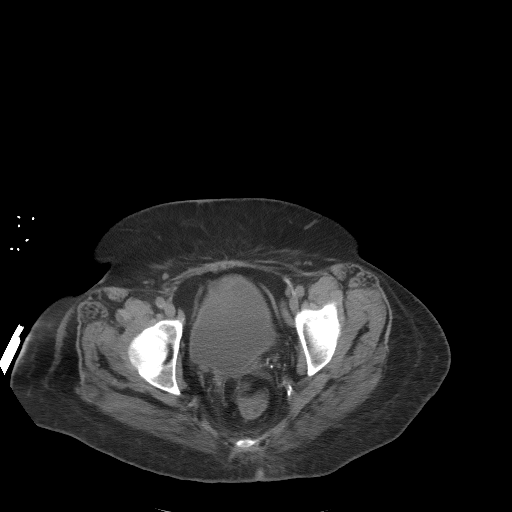
[im 25/88  soft-tissue]
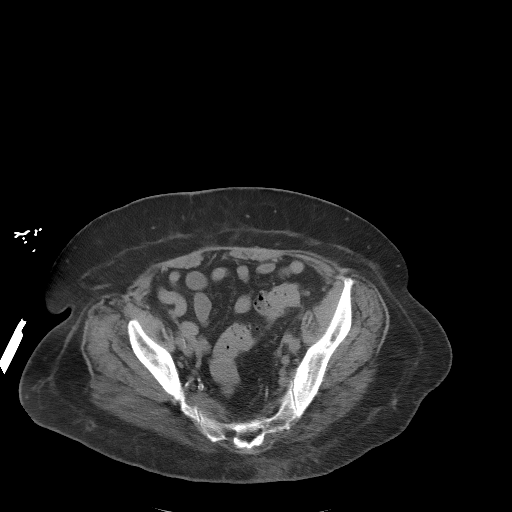
[im 28/88  soft-tissue]
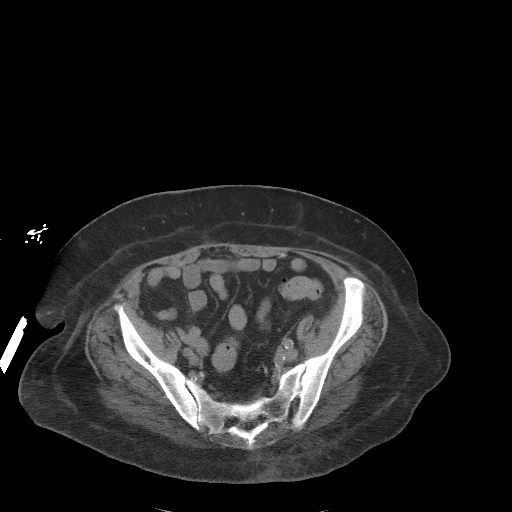
[im 35/88  soft-tissue]
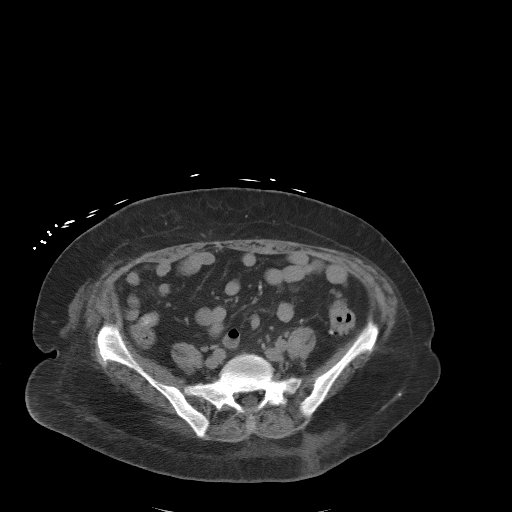
[im 42/88  soft-tissue]
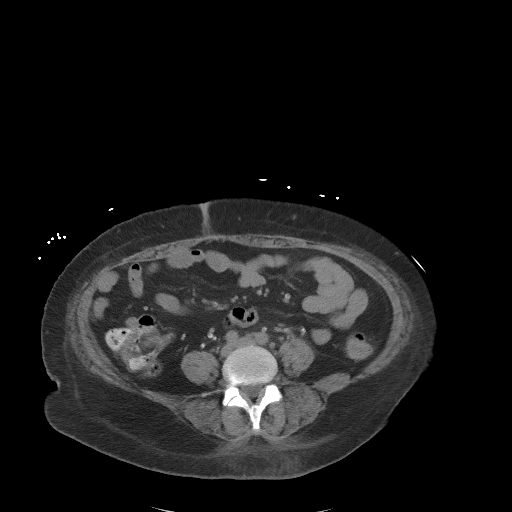
[im 46/88  soft-tissue]
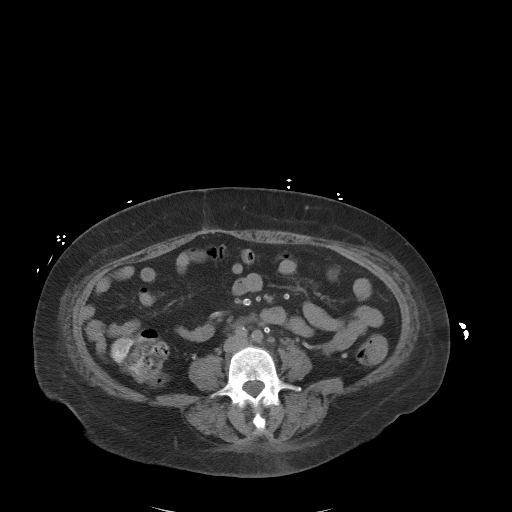
[im 53/88  soft-tissue]
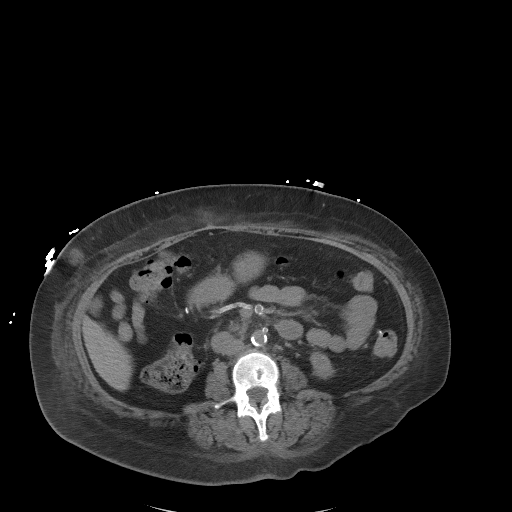
[im 53/88  bone]
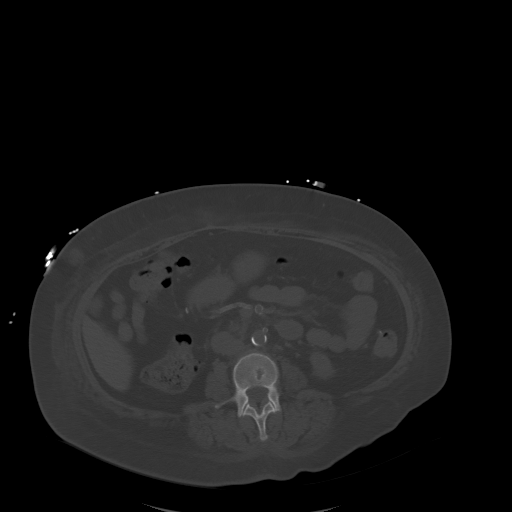
[im 60/88  soft-tissue]
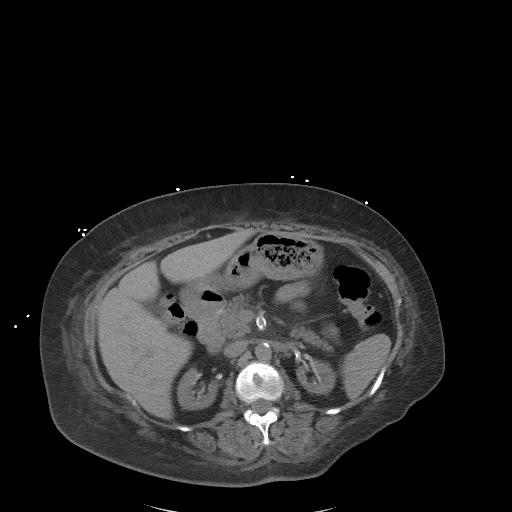
[im 67/88  soft-tissue]
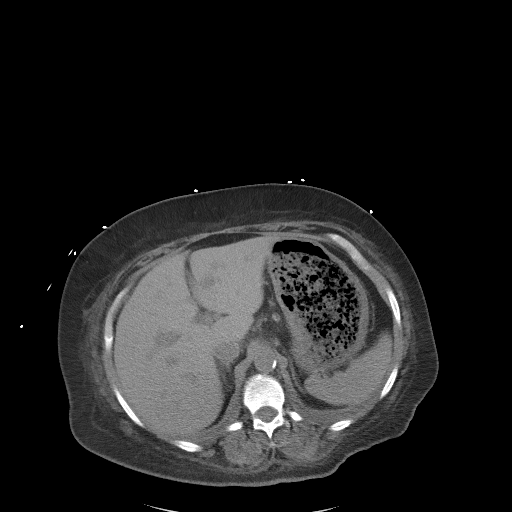
[im 70/88  soft-tissue]
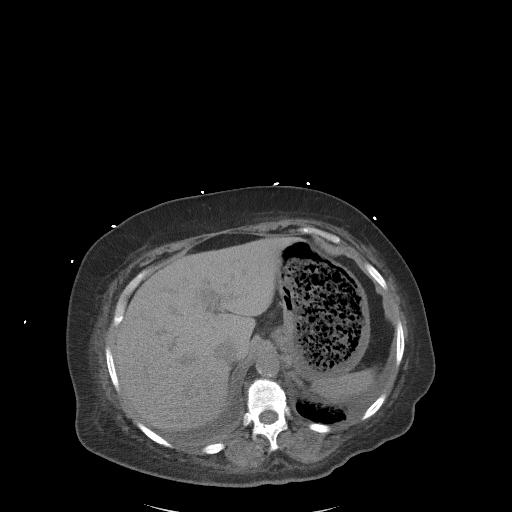
[im 77/88  soft-tissue]
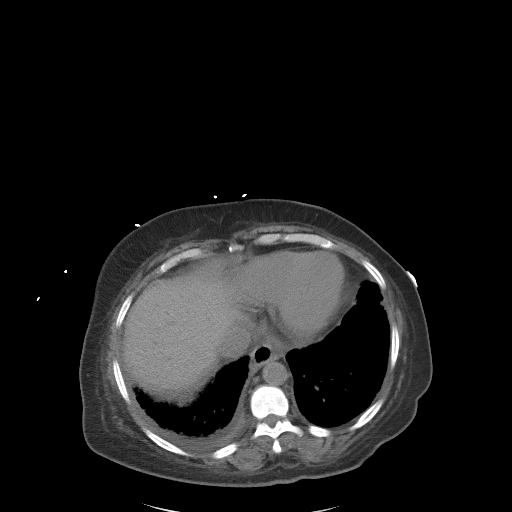
[im 84/88  soft-tissue]
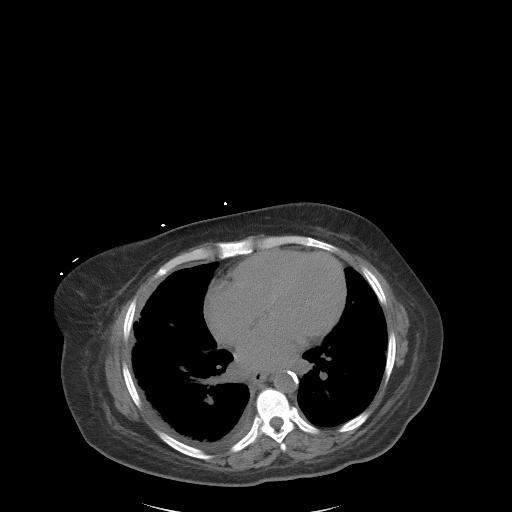

[Series 5: coronal st · coronal · 0.85mm/px · 3 of 91 slices shown]
[im 31/91  soft-tissue]
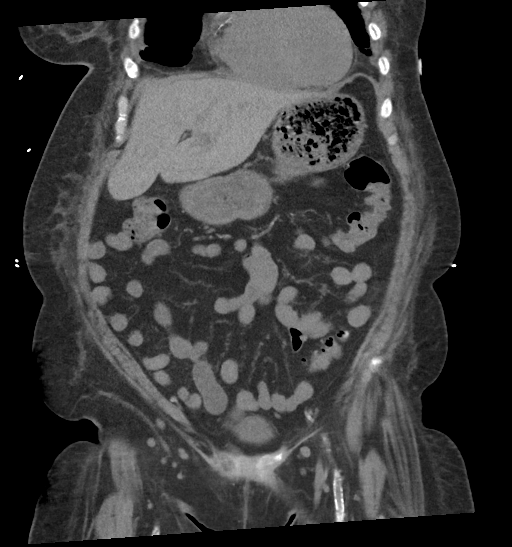
[im 41/91  soft-tissue]
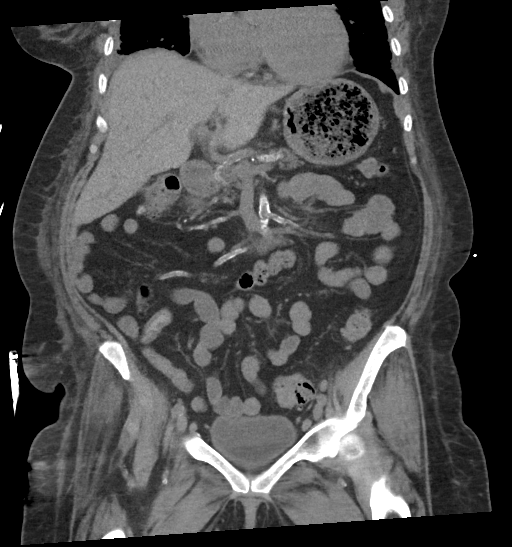
[im 51/91  soft-tissue]
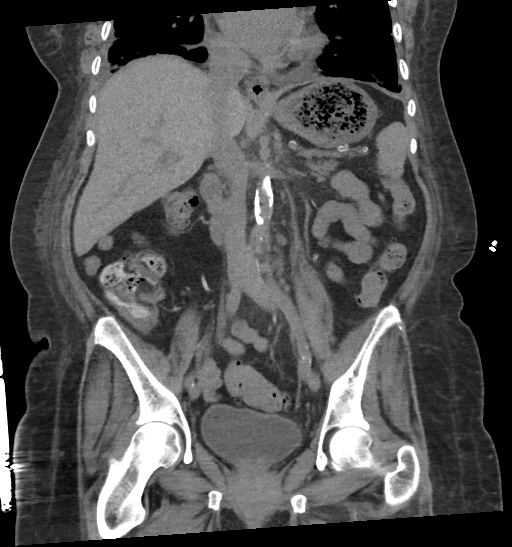

[17 of 46 positions shown; findings below may reference images not displayed]

FINDINGS: Lower chest: Small bilateral pleural effusions. Heart is normal
size.

Hepatobiliary: Prior cholecystectomy. No focal hepatic abnormality.
Probable mild intrahepatic and extrahepatic biliary ductal
dilatation, likely related to post cholecystectomy state, stable.

Pancreas: No focal abnormality or ductal dilatation.

Spleen: No focal abnormality.  Normal size.

Adrenals/Urinary Tract: Atrophic kidneys compatible with end-stage
renal disease. No hydronephrosis. Adrenal glands and urinary bladder
unremarkable.

Stomach/Bowel: Sigmoid and descending colonic diverticulosis.
Scattered right colonic diverticula. No active diverticulitis.
Stomach, large and small bowel grossly unremarkable.

Vascular/Lymphatic: Diffuse aortic, branch vessel calcifications. No
evidence of aneurysm or adenopathy.

Reproductive: Prior hysterectomy.  No adnexal masses.

Other: No free fluid or free air.

Musculoskeletal: No acute bony abnormality.
IMPRESSION: Small bilateral pleural effusions.

No acute findings in the abdomen or pelvis.

Scattered colonic diverticulosis, most pronounced in the left colon.
No active diverticulitis.

## 2018-03-16 ENCOUNTER — Ambulatory Visit: Payer: Self-pay | Admitting: Urology

## 2018-04-23 ENCOUNTER — Encounter

## 2018-04-23 ENCOUNTER — Ambulatory Visit: Payer: Self-pay | Admitting: Urology

## 2018-05-05 ENCOUNTER — Encounter: Payer: Self-pay | Admitting: Emergency Medicine

## 2018-05-05 ENCOUNTER — Emergency Department
Admission: EM | Admit: 2018-05-05 | Discharge: 2018-05-05 | Disposition: A | Discharge status: Home / Self Care | Payer: Medicare Other | Attending: Emergency Medicine | Admitting: Emergency Medicine

## 2018-05-05 ENCOUNTER — Other Ambulatory Visit: Payer: Self-pay

## 2018-05-05 ENCOUNTER — Emergency Department: Payer: Medicare Other

## 2018-05-05 DIAGNOSIS — Z992 Dependence on renal dialysis: Secondary | ICD-10-CM | POA: Insufficient documentation

## 2018-05-05 DIAGNOSIS — Z87891 Personal history of nicotine dependence: Secondary | ICD-10-CM | POA: Diagnosis not present

## 2018-05-05 DIAGNOSIS — Z7982 Long term (current) use of aspirin: Secondary | ICD-10-CM | POA: Diagnosis not present

## 2018-05-05 DIAGNOSIS — I2510 Atherosclerotic heart disease of native coronary artery without angina pectoris: Secondary | ICD-10-CM | POA: Insufficient documentation

## 2018-05-05 DIAGNOSIS — I120 Hypertensive chronic kidney disease with stage 5 chronic kidney disease or end stage renal disease: Secondary | ICD-10-CM | POA: Insufficient documentation

## 2018-05-05 DIAGNOSIS — E119 Type 2 diabetes mellitus without complications: Secondary | ICD-10-CM | POA: Diagnosis not present

## 2018-05-05 DIAGNOSIS — Z794 Long term (current) use of insulin: Secondary | ICD-10-CM | POA: Insufficient documentation

## 2018-05-05 DIAGNOSIS — R1084 Generalized abdominal pain: Secondary | ICD-10-CM | POA: Insufficient documentation

## 2018-05-05 DIAGNOSIS — Z79899 Other long term (current) drug therapy: Secondary | ICD-10-CM | POA: Insufficient documentation

## 2018-05-05 DIAGNOSIS — G8929 Other chronic pain: Secondary | ICD-10-CM | POA: Diagnosis not present

## 2018-05-05 DIAGNOSIS — R109 Unspecified abdominal pain: Secondary | ICD-10-CM

## 2018-05-05 DIAGNOSIS — N186 End stage renal disease: Secondary | ICD-10-CM | POA: Insufficient documentation

## 2018-05-05 LAB — CBC
HCT: 40.6 % (ref 35.0–47.0)
Hemoglobin: 13.7 g/dL (ref 12.0–16.0)
MCH: 29.8 pg (ref 26.0–34.0)
MCHC: 33.7 g/dL (ref 32.0–36.0)
MCV: 88.3 fL (ref 80.0–100.0)
Platelets: 137 10*3/uL — ABNORMAL LOW (ref 150–440)
RBC: 4.6 MIL/uL (ref 3.80–5.20)
RDW: 14.7 % — ABNORMAL HIGH (ref 11.5–14.5)
WBC: 5.6 10*3/uL (ref 3.6–11.0)

## 2018-05-05 LAB — COMPREHENSIVE METABOLIC PANEL
ALT: 26 U/L (ref 0–44)
AST: 29 U/L (ref 15–41)
Albumin: 4.6 g/dL (ref 3.5–5.0)
Alkaline Phosphatase: 124 U/L (ref 38–126)
Anion gap: 17 — ABNORMAL HIGH (ref 5–15)
BUN: 70 mg/dL — ABNORMAL HIGH (ref 6–20)
CO2: 26 mmol/L (ref 22–32)
Calcium: 9.1 mg/dL (ref 8.9–10.3)
Chloride: 93 mmol/L — ABNORMAL LOW (ref 98–111)
Creatinine, Ser: 10.05 mg/dL — ABNORMAL HIGH (ref 0.44–1.00)
GFR calc Af Amer: 4 mL/min — ABNORMAL LOW (ref >60)
GFR calc non Af Amer: 4 mL/min — ABNORMAL LOW (ref >60)
Glucose, Bld: 233 mg/dL — ABNORMAL HIGH (ref 70–99)
Potassium: 5 mmol/L (ref 3.5–5.1)
Sodium: 136 mmol/L (ref 135–145)
Total Bilirubin: 0.8 mg/dL (ref 0.3–1.2)
Total Protein: 8.6 g/dL — ABNORMAL HIGH (ref 6.5–8.1)

## 2018-05-05 LAB — PHOSPHORUS: Phosphorus: 4.6 mg/dL (ref 2.5–4.6)

## 2018-05-05 LAB — MAGNESIUM: Magnesium: 3.7 mg/dL — ABNORMAL HIGH (ref 1.7–2.4)

## 2018-05-05 LAB — LIPASE, BLOOD: Lipase: 34 U/L (ref 11–51)

## 2018-05-05 MED ORDER — HALOPERIDOL LACTATE 5 MG/ML IJ SOLN
5.0000 mg | Freq: Once | INTRAMUSCULAR | Status: AC
  Administered 2018-05-05: 5 mg via INTRAMUSCULAR
  Filled 2018-05-05: qty 1

## 2018-05-05 MED ORDER — HYDROMORPHONE HCL 1 MG/ML IJ SOLN
1.0000 mg | INTRAMUSCULAR | Status: AC
  Administered 2018-05-05: 1 mg via SUBCUTANEOUS
  Filled 2018-05-05: qty 1

## 2018-05-05 NOTE — ED Notes (Signed)
First RN note:  Pat here via [facility] from home c/o left flank pain.  Patient states it started Monday and has gotten worse.  N/V present.  No IV in place and no medications given at this time.  12 lead unremarkable and VSS with EMS. CBG 238.

## 2018-05-05 NOTE — ED Provider Notes (Signed)
[facility] Emergency Department Provider Note  ____________________________________________  Time seen: Approximately 8:19 PM  I have reviewed the triage vital signs and the nursing notes.   HISTORY  Chief Complaint Flank Pain and Emesis    HPI AARINI SLEE is a 57 y.o. female with a history of end-stage renal disease on hemodialysis Tuesday Thursday Saturday, diabetes, COPD, GERD who complains of severe left-sided abdominal pain radiating to the back, associated with nausea and vomiting, started yesterday, constant, no aggravating or alleviating factors, severe.  Waxing and waning.  Reports she missed her dialysis session yesterday.  Denies chest pain shortness of breath or palpitations.  No dizziness or syncope.      Past Medical History:  Diagnosis Date  . Anemia   . Anginal pain ([place])   . Anxiety   . Arthritis   . Asthma   . Broken wrist   . Bronchitis   . chronic diastolic CHF 6/38/9373  . COPD (chronic obstructive pulmonary disease) ([place])   . Coronary artery disease    a. cath 2013: stenting to RCA (report not available); b. cath 2014: LM nl, pLAD 40%, mLAD nl, ost LCx 40%, mid LCx nl, pRCA 30% @ site of prior stent, mRCA 50%  . Depression   . Diabetes mellitus without complication ([place])   . Diabetic neuropathy ([place])   . dialysis 2006  . Diverticulosis   . Dizziness   . Dyspnea   . Elevated lipids   . Environmental and seasonal allergies   . ESRD (end stage renal disease) on dialysis ([place])    M-W-F  . GERD (gastroesophageal reflux disease)   . Headache   . History of hiatal hernia   . HOH (hard of hearing)   . Hx of pancreatitis 2015  . Hypertension   . Lower extremity edema   . Mitral regurgitation    a. echo 10/2013: EF 62%, noWMA, mildly dilated LA, mild to mod MR/TR, GR1DD  . Myocardial infarction ([place])   . Orthopnea   . Pneumonia   . Renal cancer ([place])   . Renal insufficiency    Pt is on dialysis on M,W + F.  . Wheezing       Patient Active Problem List   Diagnosis Date Noted  . Gastroparesis due to DM ([place]) 01/18/2018  . Complication of vascular access for dialysis 12/04/2017  . Osteomyelitis ([place]) 09/30/2017  . Carotid stenosis 06/18/2017  . Shortness of breath 05/04/2017  . Cellulitis of lower extremity 07/29/2016  . Chronic venous insufficiency 07/29/2016  . Lymphedema 07/29/2016  . TIA (transient ischemic attack) 04/21/2016  . Altered mental status 04/08/2016  . Hyperammonemia ([place]) 04/08/2016  . Elevated troponin 04/08/2016  . Depression 04/08/2016  . Depression, major, recurrent, severe with psychosis ([place]) 04/08/2016  . Blood in stool   . Intractable cyclical vomiting with nausea   . Reflux esophagitis   . Gastritis   . Generalized abdominal pain   . Uncontrollable vomiting   . Major depressive disorder, recurrent episode, moderate ([place]) 03/15/2016  . Adjustment disorder with mixed anxiety and depressed mood 03/15/2016  . Malnutrition of moderate degree 12/01/2015  . Renal mass   . Dyspnea   . Acute renal failure ([place])   . Respiratory failure ([place])   . High temperature 11/14/2015  . Pulmonary edema   . Encounter for central line placement   . Encounter for orogastric (OG) tube placement   . Nausea 11/12/2015  . Hyperkalemia 10/03/2015  . Diarrhea, unspecified 07/22/2015  .  Pneumonia 05/21/2015  . Hypoglycemia 04/24/2015  . Unresponsiveness 04/24/2015  . Bradycardia 04/24/2015  . Hypothermia 04/24/2015  . Acute respiratory failure ([place]) 04/24/2015  . Acute diastolic CHF (congestive heart failure) ([place]) 04/05/2015  . Diabetic gastroparesis ([place]) 04/05/2015  . Hypokalemia 04/05/2015  . Generalized weakness 04/05/2015  . Acute pulmonary edema ([place]) 04/03/2015  . Nausea and vomiting 04/03/2015  . Hypoglycemia associated with diabetes ([place]) 04/03/2015  . Anemia of chronic disease 04/03/2015  . Secondary hyperparathyroidism ([place]) 04/03/2015  . Pressure ulcer 04/02/2015  . Acute  respiratory failure with hypoxia ([place]) 04/01/2015  . Adjustment disorder with anxiety 03/14/2015  . Somatic symptom disorder, mild 03/08/2015  . Coronary artery disease involving native coronary artery of native heart without angina pectoris   . Nausea & vomiting 03/06/2015  . Abdominal pain 03/06/2015  . DM (diabetes mellitus) ([place]) 03/06/2015  . HTN (hypertension) 03/06/2015  . Gastroparesis 02/24/2015  . Pleural effusion 02/19/2015  . HCAP (healthcare-associated pneumonia) 02/19/2015  . End-stage renal disease on hemodialysis ([place]) 02/19/2015     Past Surgical History:  Procedure Laterality Date  . A/V SHUNTOGRAM Left 01/20/2018   Procedure: A/V SHUNTOGRAM;  Surgeon: Algernon Huxley, MD;  Location: [place] [facility];  Service: Cardiovascular;  Laterality: Left;  . ABDOMINAL HYSTERECTOMY  1992  . AMPUTATION TOE Left 10/02/2017   Procedure: AMPUTATION TOE-LEFT GREAT TOE;  Surgeon: Albertine Patricia, DPM;  Location: [facility] ORS;  Service: Podiatry;  Laterality: Left;  . APPENDECTOMY    . CARDIAC CATHETERIZATION Left 07/26/2015   Procedure: Left Heart Cath and Coronary Angiography;  Surgeon: Dionisio David, MD;  Location: [place] [facility];  Service: Cardiovascular;  Laterality: Left;  . CATARACT EXTRACTION W/ INTRAOCULAR LENS IMPLANT Right   . CATARACT EXTRACTION W/PHACO Left 03/10/2017   Procedure: CATARACT EXTRACTION PHACO AND INTRAOCULAR LENS PLACEMENT (IOC);  Surgeon: Birder Robson, MD;  Location: [facility] ORS;  Service: Ophthalmology;  Laterality: Left;  [place] 00:51.9 AP% 14.2 CDE 7.39 fluid pack lot # [id number]  . CHOLECYSTECTOMY    . COLONOSCOPY WITH PROPOFOL N/A 08/12/2016   Procedure: COLONOSCOPY WITH PROPOFOL;  Surgeon: Lollie Sails, MD;  Location: [facility] ENDOSCOPY;  Service: Endoscopy;  Laterality: N/A;  . DIALYSIS FISTULA CREATION Left    upper arm  . dialysis grafts    . ESOPHAGOGASTRODUODENOSCOPY N/A 03/08/2015   Procedure: ESOPHAGOGASTRODUODENOSCOPY (EGD);  Surgeon:  Manya Silvas, MD;  Location: [facility] ENDOSCOPY;  Service: Endoscopy;  Laterality: N/A;  . ESOPHAGOGASTRODUODENOSCOPY (EGD) WITH PROPOFOL N/A 03/18/2016   Procedure: ESOPHAGOGASTRODUODENOSCOPY (EGD) WITH PROPOFOL;  Surgeon: [name], MD;  Location: [facility] ENDOSCOPY;  Service: Endoscopy;  Laterality: N/A;  . EYE SURGERY Right 2018  . FECAL TRANSPLANT N/A 08/23/2015   Procedure: FECAL TRANSPLANT;  Surgeon: Manya Silvas, MD;  Location: [facility] ENDOSCOPY;  Service: Endoscopy;  Laterality: N/A;  . HAND SURGERY Bilateral   . PERIPHERAL VASCULAR CATHETERIZATION N/A 12/20/2015   Procedure: Thrombectomy of dialysis access versus permcath placement;  Surgeon: Algernon Huxley, MD;  Location: [place] [facility];  Service: Cardiovascular;  Laterality: N/A;  . PERIPHERAL VASCULAR CATHETERIZATION N/A 12/20/2015   Procedure: A/V Shunt Intervention;  Surgeon: Algernon Huxley, MD;  Location: [place] [facility];  Service: Cardiovascular;  Laterality: N/A;  . PERIPHERAL VASCULAR CATHETERIZATION N/A 12/20/2015   Procedure: A/V Shuntogram/Fistulagram;  Surgeon: Algernon Huxley, MD;  Location: [place] [facility];  Service: Cardiovascular;  Laterality: N/A;  . PERIPHERAL VASCULAR CATHETERIZATION N/A 01/02/2016   Procedure: A/V Shuntogram/Fistulagram;  Surgeon:  Algernon Huxley, MD;  Location: [place] [facility];  Service: Cardiovascular;  Laterality: N/A;  . PERIPHERAL VASCULAR CATHETERIZATION N/A 01/02/2016   Procedure: A/V Shunt Intervention;  Surgeon: Algernon Huxley, MD;  Location: [place] [facility];  Service: Cardiovascular;  Laterality: N/A;     Prior to Admission medications   Medication Sig Start Date End Date Taking? Authorizing Provider  albuterol (PROVENTIL HFA;VENTOLIN HFA) 108 (90 Base) MCG/ACT inhaler Inhale 2 puffs into the lungs every 6 (six) hours as needed for wheezing or shortness of breath.     [provider]  albuterol (PROVENTIL HFA;VENTOLIN HFA) 108 (90 Base) MCG/ACT inhaler Inhale 2 puffs into the  lungs every 6 (six) hours as needed for wheezing or shortness of breath. 02/17/18   Merlyn Lot, MD  albuterol (PROVENTIL) (2.5 MG/3ML) 0.083% nebulizer solution Take 2.5 mg by nebulization every 4 (four) hours as needed for wheezing or shortness of breath.    [provider]  amLODipine (NORVASC) 10 MG tablet Take 10 mg by mouth daily.    [provider]  aspirin EC 81 MG tablet Take 81 mg by mouth daily. 09/08/16   [provider]  atorvastatin (LIPITOR) 20 MG tablet Take 20 mg by mouth every evening.  12/02/16   [provider]  Biotin 1 MG CAPS Take 1 mg by mouth daily.    [provider]  cetirizine (ZYRTEC) 10 MG tablet Take 10 mg by mouth daily.     [provider]  cholecalciferol (VITAMIN D) 400 units TABS tablet Take 400 Units by mouth daily.    [provider]  cyanocobalamin 1000 MCG tablet Take 1,000 mcg by mouth daily.     [provider]  diclofenac sodium (VOLTAREN) 1 % GEL Apply 1 application topically daily as needed (for rash).    [provider]  ethyl chloride spray Apply 1 application topically 3 (three) times a week. 01/18/18   [provider]  FLUoxetine (PROZAC) 20 MG capsule Take 1 capsule (20 mg total) by mouth daily. Patient taking differently: Take 20 mg by mouth at bedtime.  04/10/16   Hower, Aaron Mose, MD  glucose 4 GM chewable tablet Chew 3-4 tablets by mouth as needed for low blood sugar (blood sugar less than 60).     [provider]  HYDROcodone-acetaminophen (NORCO/VICODIN) 5-325 MG tablet Take 1 tablet by mouth every 6 (six) hours as needed for moderate pain or severe pain. Patient taking differently: Take 1 tablet by mouth 3 (three) times daily as needed for moderate pain or severe pain.  05/05/17   Gouru, Illene Silver, MD  hydrocortisone cream 0.5 % Apply topically 3 (three) times daily. 01/21/18   Wieting, Richard, MD  insulin aspart (NOVOLOG FLEXPEN) 100 UNIT/ML FlexPen  Inject 4-15 Units into the skin 4 (four) times daily as needed for high blood sugar (> 100, per sliding scale).    [provider]  lidocaine (LIDODERM) 5 % Place 1 patch onto the skin every 12 (twelve) hours. Remove & Discard patch within 12 hours or as directed by MD 02/20/18 02/20/19  Nance Pear, MD  lipase/protease/amylase (CREON) 12000 UNITS CPEP capsule Take 36000 mg in the morning, 24000 midday & 36000 in the evening    [provider]  meclizine (ANTIVERT) 25 MG tablet Take 25 mg by mouth every 6 (six) hours as needed for dizziness.    [provider]  multivitamin (RENA-VIT) TABS tablet Take 1 tablet by mouth daily.  [provider]  nitroGLYCERIN (NITROSTAT) 0.4 MG SL tablet Place 0.4 mg under the tongue every 5 (five) minutes as needed for chest pain.     [provider]  ranitidine (ZANTAC) 300 MG capsule Take 1 capsule by mouth every evening. 01/19/18   [provider]  rifaximin (XIFAXAN) 550 MG TABS tablet Take 550 mg by mouth 2 (two) times daily.    [provider]  sevelamer carbonate (RENVELA) 800 MG tablet Take 800-2,400 mg by mouth See admin instructions. Take 2400 mg by mouth 3 times daily with meals and take 800 mg by mouth with snacks    [provider]     Allergies Ace inhibitors; Ativan [lorazepam]; Compazine [prochlorperazine edisylate]; Sumatriptan succinate; Dilaudid [hydromorphone hcl]; Ondansetron; Zofran [ondansetron hcl]; Codeine; Gabapentin; [place]; Losartan; Oxycodone; Prochlorperazine; Reglan [metoclopramide]; Scopolamine; Tape; and Tapentadol   Family History  Problem Relation Age of Onset  . Kidney disease Mother   . Diabetes Mother   . Cancer Father   . Kidney disease Sister     Social History Social History   Tobacco Use  . Smoking status: Former Smoker    Packs/day: 0.50    Years: 40.00    Pack years: 20.00    Types: Cigarettes    Last attempt to quit: 02/13/2015     Years since quitting: 3.2  . Smokeless tobacco: Never Used  Substance Use Topics  . Alcohol use: Not Currently    Comment: glass wine week per pt  . Drug use: Yes    Types: Marijuana    Comment: once a day    Review of Systems  Constitutional:   No fever or chills.  ENT:   No sore throat. No rhinorrhea. Cardiovascular:   No chest pain or syncope. Respiratory:   No dyspnea or cough. Gastrointestinal: Positive as above for abdominal pain and vomiting.  Diarrhea since yesterday as well..  Musculoskeletal:   Negative for focal pain or swelling All other systems reviewed and are negative except as documented above in ROS and HPI.  ____________________________________________   PHYSICAL EXAM:  VITAL SIGNS: ED Triage Vitals [05/05/18 1434]  Enc Vitals Group     BP (!) 170/62     Pulse Rate 64     Resp 16     Temp 97.9 F (36.6 C)     Temp Source Oral     SpO2 100 %     Weight 169 lb 12.1 oz (77 kg)     Height 5\' 5"  (1.651 m)     Head Circumference      Peak Flow      Pain Score 10     Pain Loc      Pain Edu?      Excl. in [place]?     Vital signs reviewed, nursing assessments reviewed.   Constitutional:   Alert and oriented. Non-toxic appearance. Eyes:   Conjunctivae are normal. EOMI. PERRL. ENT      Head:   Normocephalic and atraumatic.      Nose:   No congestion/rhinnorhea.       Mouth/Throat:   MMM, no pharyngeal erythema. No peritonsillar mass.       Neck:   No meningismus. Full ROM. Hematological/Lymphatic/Immunilogical:   No cervical lymphadenopathy. Cardiovascular:   RRR. Symmetric bilateral radial and DP pulses.  No murmurs. Cap refill less than 2 seconds.  AV fistula left upper extremity good thrill and pulse Respiratory:   Normal respiratory effort without tachypnea/retractions. Breath sounds are  clear and equal bilaterally. No wheezes/rales/rhonchi. Gastrointestinal:   Soft without focal tenderness.  Difficult to get a reliable exam as patient screams with  even minimal touch of the skin surface.  Non distended. There is no CVA tenderness.  No rebound, rigidity, or guarding. Musculoskeletal:   Normal range of motion in all extremities. No joint effusions.  No lower extremity tenderness.  No edema. Neurologic:   Normal speech and language.  Motor grossly intact. No acute focal neurologic deficits are appreciated.  Skin:    Skin is warm, dry and intact. No rash noted.  No petechiae, purpura, or bullae.  ____________________________________________    LABS (pertinent positives/negatives) (all labs ordered are listed, but only abnormal results are displayed) Labs Reviewed  COMPREHENSIVE METABOLIC PANEL - Abnormal; Notable for the following components:      Result Value   Chloride 93 (*)    Glucose, Bld 233 (*)    BUN 70 (*)    Creatinine, Ser 10.05 (*)    Total Protein 8.6 (*)    GFR calc non Af Amer 4 (*)    GFR calc Af Amer 4 (*)    Anion gap 17 (*)    All other components within normal limits  CBC - Abnormal; Notable for the following components:   RDW 14.7 (*)    Platelets 137 (*)    All other components within normal limits  MAGNESIUM - Abnormal; Notable for the following components:   Magnesium 3.7 (*)    All other components within normal limits  URINE CULTURE  LIPASE, BLOOD  PHOSPHORUS  URINALYSIS, COMPLETE (UACMP) WITH MICROSCOPIC   ____________________________________________   EKG    ____________________________________________    RADIOLOGY  Dg Abdomen Acute W/chest  Result Date: 05/05/2018 CLINICAL DATA:  Severe left-sided abdominal pain. Vomiting. Diarrhea. EXAM: DG ABDOMEN ACUTE W/ 1V CHEST COMPARISON:  CT scan of the abdomen dated 03/02/2018 and chest x-ray dated 02/20/2018 FINDINGS: Heart size is normal. Minimal distention of the azygos vein with slight prominence of the pulmonary vascularity. Slight haziness at both lung bases may represent mild edema. Probable tiny right pleural effusion. Vascular stent  in the left axilla. No free air in the abdomen. Bowel gas pattern is normal. Extensive vascular calcifications in the abdomen. Aortic atherosclerosis. Surgical clips from previous cholecystectomy. No bone abnormality. IMPRESSION: No acute abnormality of the abdomen. Slight pulmonary vascular congestion and mild interstitial edema at the lung bases with a tiny right effusion. Electronically Signed   By: Lorriane Shire M.D.   On: 05/05/2018 16:57    ____________________________________________   PROCEDURES Procedures  ____________________________________________    CLINICAL IMPRESSION / ASSESSMENT AND PLAN / ED COURSE  Pertinent labs & imaging results that were available during my care of the patient were reviewed by me and considered in my medical decision making (see chart for details).    Patient presents with left-sided abdominal pain.  Doubt mesenteric ischemia AAA or dissection.  Possible viral gastroenteritis.  Possibly related to her chronic pain syndromes.  Patient given intramuscular Haldol and subcutaneous Dilaudid for symptom relief.  She then slept comfortably for a few hours.  Labs unremarkable.  X-ray abdominal series unremarkable.  On reassessment patient is easily arousable, calm and comfortable.  Abdomen is completely nontender and benign.  I doubt perforation obstruction cholecystitis torsion volvulus or appendicitis.  Stable for discharge home.  Discussed with Dr. Erlene Quan regarding the previous finding of right kidney tumors concerning for renal cell carcinoma.  She advises outpatient follow-up and notes  the patient has not yet come to clinic.  I have strongly encourage the patient to follow-up and have this evaluated.  I have warned her about the possibility of worsening metastatic disease that could be entirely preventable.      ____________________________________________   FINAL CLINICAL IMPRESSION(S) / ED DIAGNOSES    Final diagnoses:  Chronic abdominal pain   ESRD on hemodialysis [facility])     ED Discharge Orders    None      Portions of this note were generated with dragon dictation software. Dictation errors may occur despite best attempts at proofreading.    Carrie Mew, MD 05/05/18 2023

## 2018-05-05 NOTE — ED Notes (Signed)
Lab notified to collect blood from pt.

## 2018-05-05 NOTE — ED Notes (Signed)
IV team at bedside 

## 2018-05-05 NOTE — ED Triage Notes (Signed)
Patient to ED via [facility]. Reports left-sided flank pain with nausea and vomiting that started yesterday. Patient also reports several episodes of diarrhea. Patient unsure of fever. Patient calling out in discomfort and spitting into emesis bag in triage.

## 2018-05-05 NOTE — ED Notes (Signed)
ED Provider at bedside. 

## 2018-05-05 NOTE — Discharge Instructions (Signed)
Be sure you go to dialysis as scheduled tomorrow. Follow up with Dr. Erlene Quan of urology for further evaluation of the tumors on your right kidney.

## 2018-05-12 ENCOUNTER — Ambulatory Visit: Payer: Self-pay | Admitting: Urology

## 2018-05-25 IMAGING — CT CT ABD-PELV W/O CM
2 of 4 series · 16 of 46 positions shown, 18 images · non-contrast
Comparison: 10/27/2017

CLINICAL DATA: Abdominal pain.

EXAM:
CT ABDOMEN AND PELVIS WITHOUT CONTRAST
TECHNIQUE: Multidetector CT imaging of the abdomen and pelvis was performed
following the standard protocol without IV contrast.

[Series 2: routine abd/pel wo · axial · 0.72mm/px · z∈[-496,-101]mm · 13 of 87 slices shown, 15 images]
[im 4/87  soft-tissue]
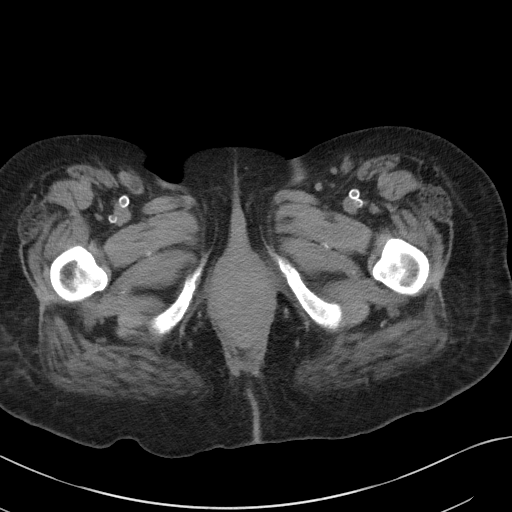
[im 4/87  bone]
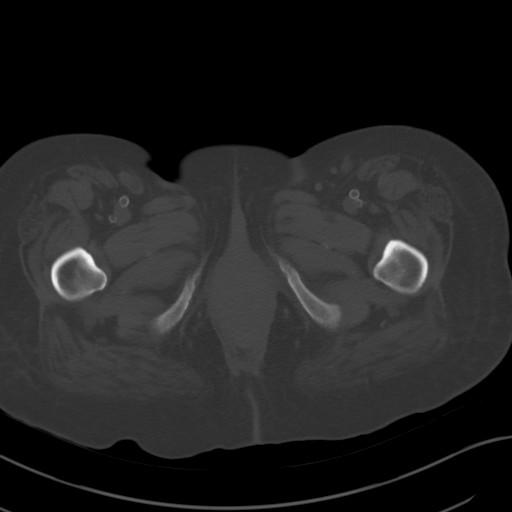
[im 11/87  soft-tissue]
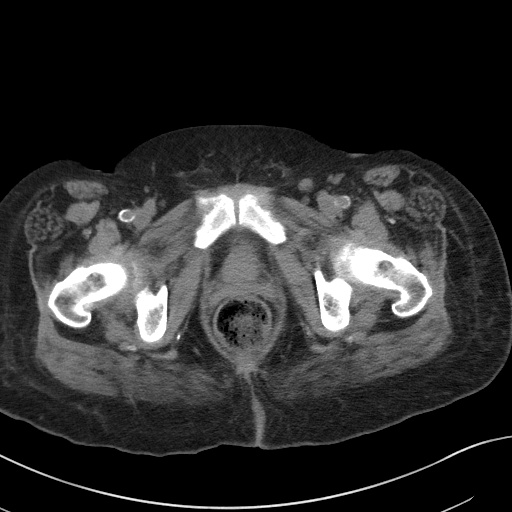
[im 18/87  soft-tissue]
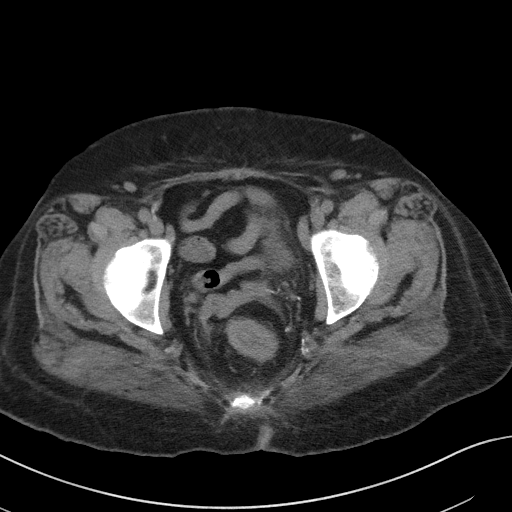
[im 25/87  soft-tissue]
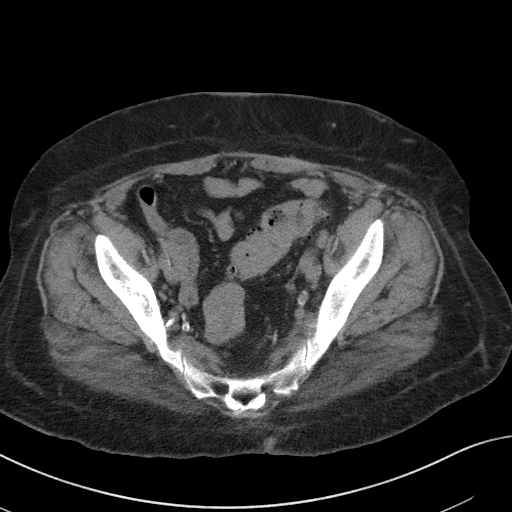
[im 31/87  soft-tissue]
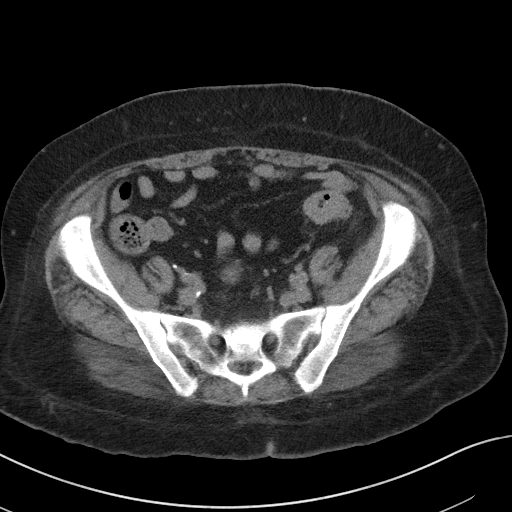
[im 38/87  soft-tissue]
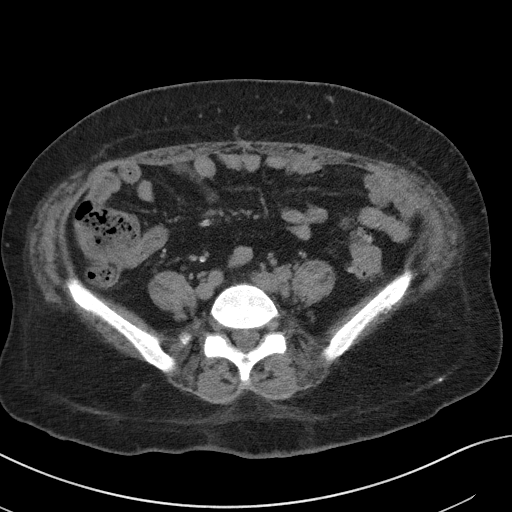
[im 45/87  soft-tissue]
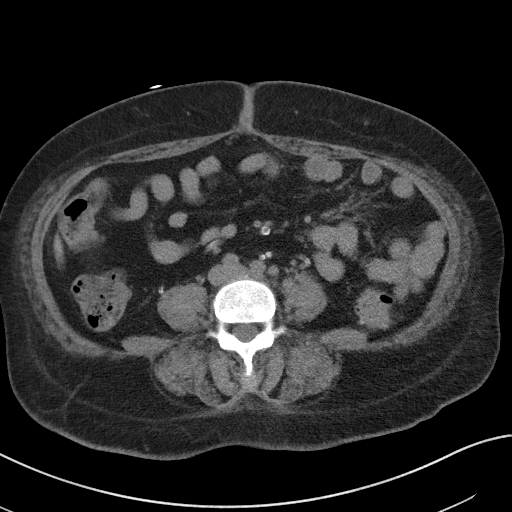
[im 49/87  soft-tissue]
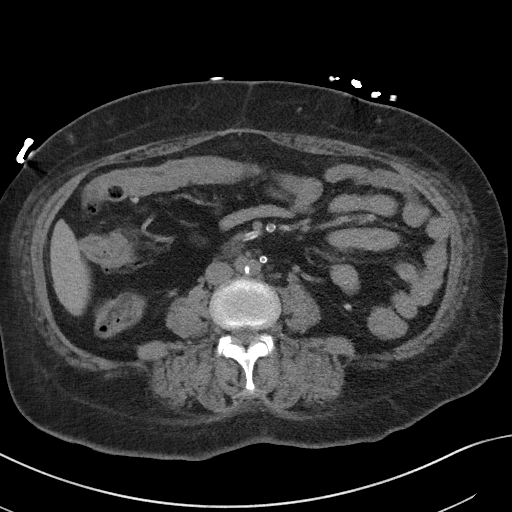
[im 56/87  soft-tissue]
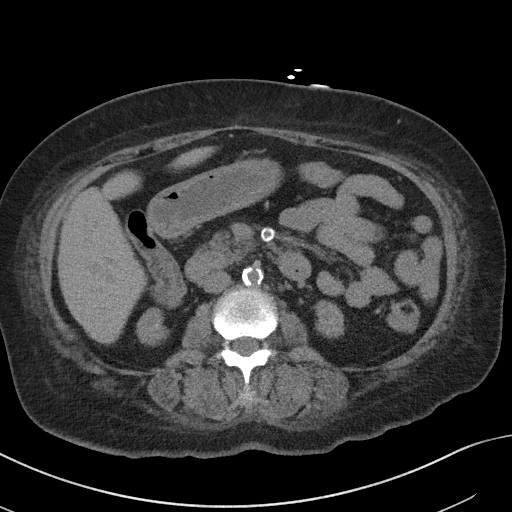
[im 56/87  bone]
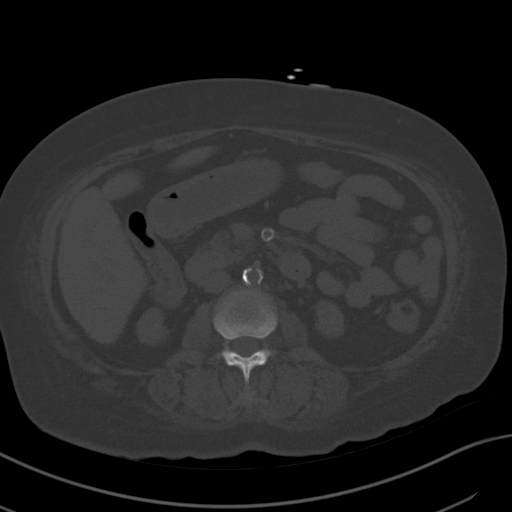
[im 62/87  soft-tissue]
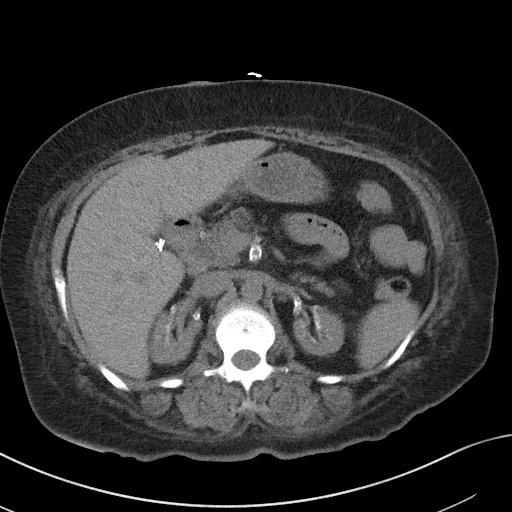
[im 69/87  soft-tissue]
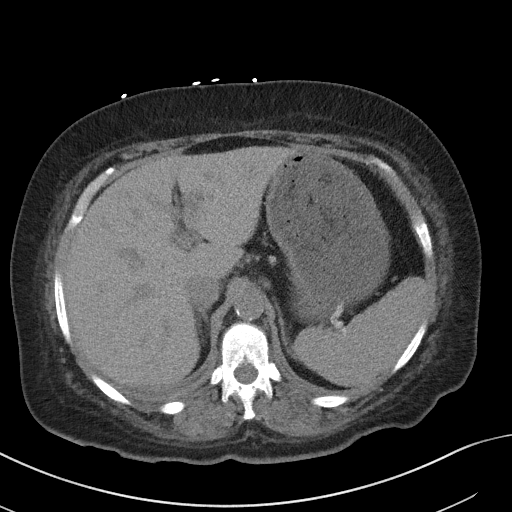
[im 76/87  soft-tissue]
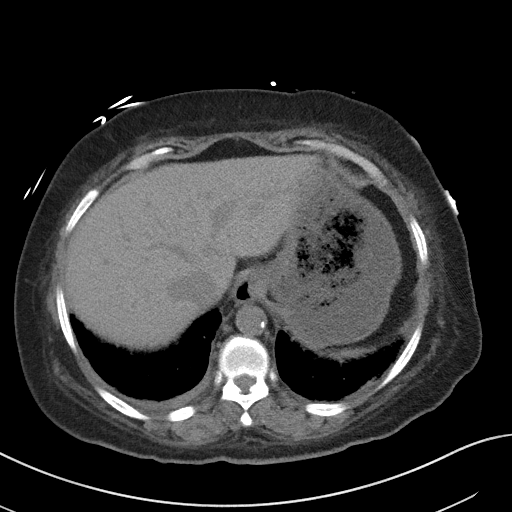
[im 83/87  soft-tissue]
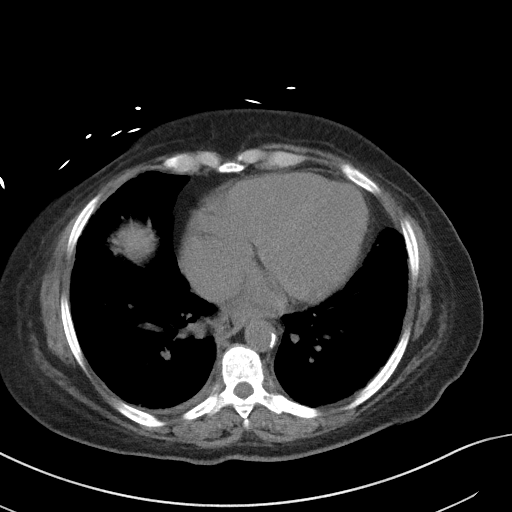

[Series 5: coronal st · coronal · 0.70mm/px · 3 of 96 slices shown]
[im 32/96  soft-tissue]
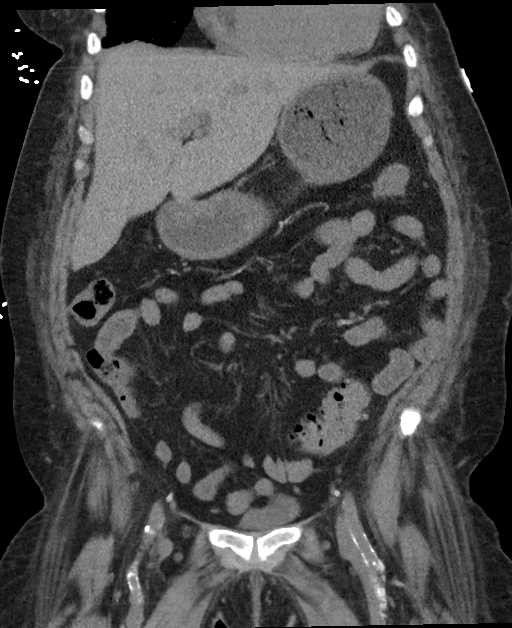
[im 43/96  soft-tissue]
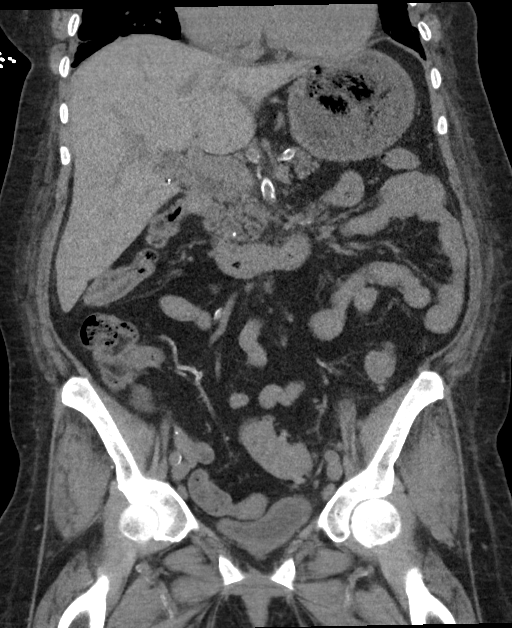
[im 53/96  soft-tissue]
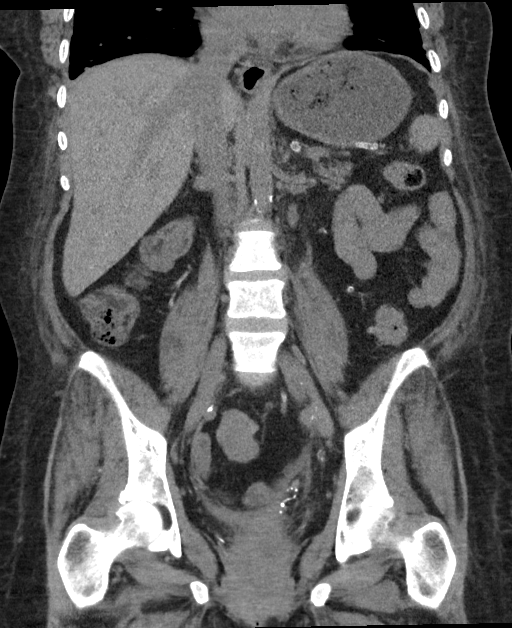

[16 of 46 positions shown; findings below may reference images not displayed]

FINDINGS: Lower chest: The lung bases are clear of an acute process. There is
some patchy atelectasis but no effusions. The heart is mildly
enlarged. No pericardial effusion. Age advanced aortic
calcifications. Small hiatal hernia.

Hepatobiliary: No focal hepatic lesions or intrahepatic biliary
dilatation. The gallbladder is surgically absent. No significant
common bile duct dilatation.

Pancreas: No mass, inflammation or ductal dilatation.

Spleen: Normal size.  No focal lesions.

Adrenals/Urinary Tract: The adrenal glands are unremarkable

Both kidneys are quite small and there are extensive vascular
calcifications consistent with chronic renal disease. No renal
lesions.

Stomach/Bowel: The stomach, duodenum, small bowel and colon are
grossly normal without oral contrast. No acute inflammatory changes,
mass lesions or obstructive findings. The terminal ileum is normal.
Colonic diverticulosis without findings for acute diverticulitis.

Vascular/Lymphatic: Advanced vascular calcifications likely related
to diabetes. No mesenteric or retroperitoneal mass or adenopathy.

Reproductive: The uterus is surgically absent. I believe the right
ovary is still present. I do not see the left ovary for certain.

Other: No free abdominal/pelvic fluid collections or free air.

Musculoskeletal: No significant bony findings. Prominent Schmorl's
nodes noted at L2 and L3. Partially fused T11-12. Borderline
inguinal lymph nodes bilaterally are stable.
IMPRESSION: 1. No acute abdominal/pelvic findings, mass lesions or adenopathy
without IV or oral contrast.
2. Status post cholecystectomy.  No biliary dilatation.
3. Bibasilar atelectasis.
4. Atrophied kidneys likely due to diabetic nephropathy and vascular
disease.
5. Diverticulosis without findings for acute diverticulitis.

## 2018-05-25 IMAGING — DX DG CHEST 1V PORT
1 series · 1 of 1 positions shown · non-contrast
Comparison: 10/23/2017

CLINICAL DATA: Severe epigastric pain and vomiting beginning today.

EXAM:
PORTABLE CHEST 1 VIEW

[chest ap]
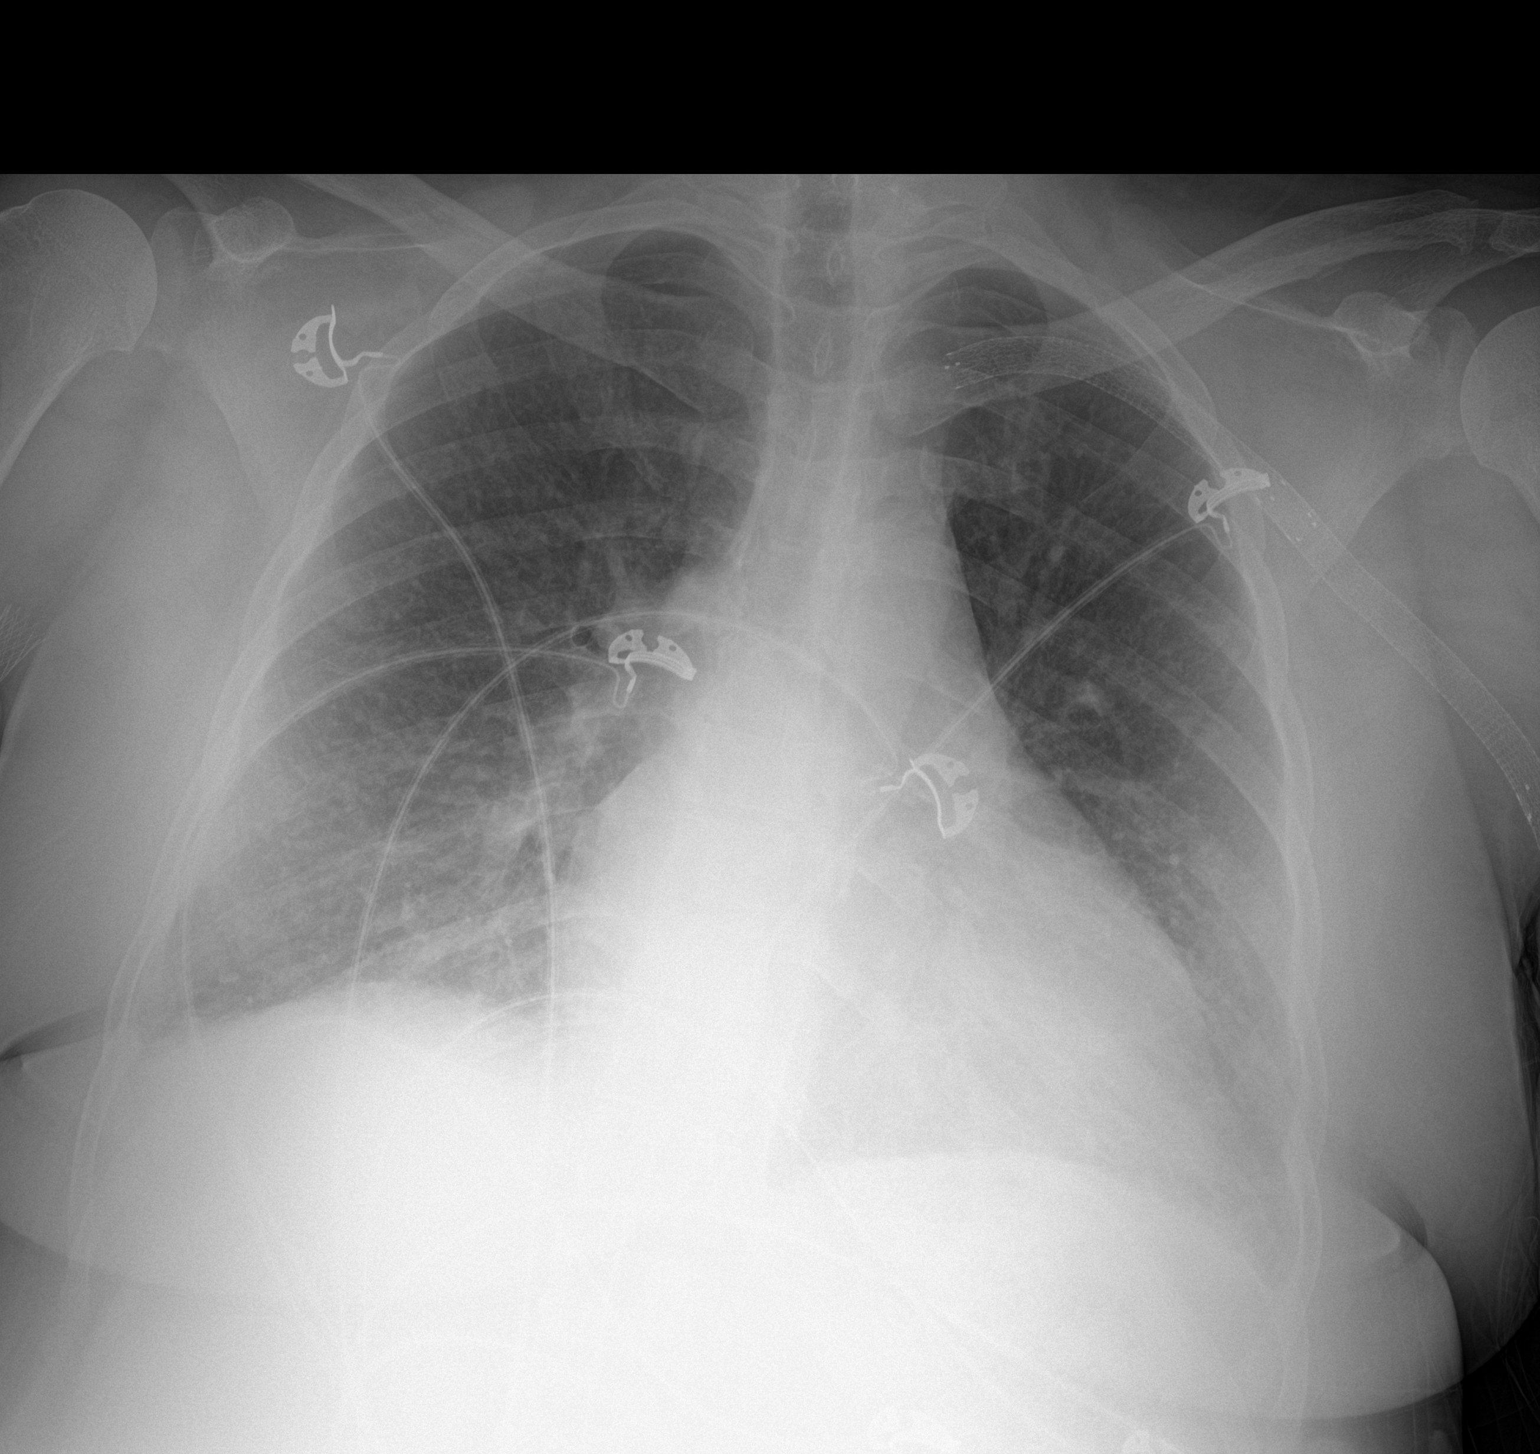

[1 of 1 positions shown; findings below may reference images not displayed]

FINDINGS: The heart size and mediastinal contours are within normal limits.
Both lungs are clear. Intravascular stent again seen in the left
subclavian and axillary regions.
IMPRESSION: No active disease.

## 2018-06-03 ENCOUNTER — Ambulatory Visit
Admission: RE | Admit: 2018-06-03 | Discharge: 2018-06-03 | Disposition: A | Discharge status: Home / Self Care | Payer: Medicare Other | Source: Ambulatory Visit | Attending: Primary Care | Admitting: Primary Care

## 2018-06-03 ENCOUNTER — Other Ambulatory Visit: Payer: Self-pay | Admitting: Primary Care

## 2018-06-03 DIAGNOSIS — M21832 Other specified acquired deformities of left forearm: Secondary | ICD-10-CM | POA: Insufficient documentation

## 2018-06-03 DIAGNOSIS — M25532 Pain in left wrist: Secondary | ICD-10-CM

## 2018-06-03 DIAGNOSIS — S52612K Displaced fracture of left ulna styloid process, subsequent encounter for closed fracture with nonunion: Secondary | ICD-10-CM | POA: Insufficient documentation

## 2018-06-03 DIAGNOSIS — X58XXXD Exposure to other specified factors, subsequent encounter: Secondary | ICD-10-CM | POA: Insufficient documentation

## 2018-06-10 ENCOUNTER — Other Ambulatory Visit: Payer: Self-pay

## 2018-06-10 ENCOUNTER — Inpatient Hospital Stay: Payer: Medicare Other | Attending: Oncology | Admitting: Oncology

## 2018-06-10 ENCOUNTER — Encounter: Payer: Self-pay | Admitting: Oncology

## 2018-06-10 ENCOUNTER — Inpatient Hospital Stay: Payer: Medicare Other

## 2018-06-10 VITALS — BP 161/67 | HR 60 | Temp 97.9°F | Resp 18 | Ht 65.0 in | Wt 194.6 lb

## 2018-06-10 DIAGNOSIS — I2510 Atherosclerotic heart disease of native coronary artery without angina pectoris: Secondary | ICD-10-CM | POA: Insufficient documentation

## 2018-06-10 DIAGNOSIS — I5032 Chronic diastolic (congestive) heart failure: Secondary | ICD-10-CM | POA: Insufficient documentation

## 2018-06-10 DIAGNOSIS — I252 Old myocardial infarction: Secondary | ICD-10-CM | POA: Diagnosis not present

## 2018-06-10 DIAGNOSIS — D472 Monoclonal gammopathy: Secondary | ICD-10-CM

## 2018-06-10 DIAGNOSIS — Z87891 Personal history of nicotine dependence: Secondary | ICD-10-CM | POA: Insufficient documentation

## 2018-06-10 DIAGNOSIS — M545 Low back pain: Secondary | ICD-10-CM | POA: Insufficient documentation

## 2018-06-10 DIAGNOSIS — Z992 Dependence on renal dialysis: Secondary | ICD-10-CM | POA: Insufficient documentation

## 2018-06-10 DIAGNOSIS — N186 End stage renal disease: Secondary | ICD-10-CM | POA: Diagnosis not present

## 2018-06-10 DIAGNOSIS — I132 Hypertensive heart and chronic kidney disease with heart failure and with stage 5 chronic kidney disease, or end stage renal disease: Secondary | ICD-10-CM | POA: Diagnosis present

## 2018-06-10 DIAGNOSIS — K219 Gastro-esophageal reflux disease without esophagitis: Secondary | ICD-10-CM | POA: Insufficient documentation

## 2018-06-10 DIAGNOSIS — Z79899 Other long term (current) drug therapy: Secondary | ICD-10-CM | POA: Diagnosis not present

## 2018-06-10 DIAGNOSIS — E1122 Type 2 diabetes mellitus with diabetic chronic kidney disease: Secondary | ICD-10-CM | POA: Insufficient documentation

## 2018-06-10 DIAGNOSIS — D649 Anemia, unspecified: Secondary | ICD-10-CM

## 2018-06-10 DIAGNOSIS — E1140 Type 2 diabetes mellitus with diabetic neuropathy, unspecified: Secondary | ICD-10-CM | POA: Diagnosis not present

## 2018-06-10 DIAGNOSIS — M8580 Other specified disorders of bone density and structure, unspecified site: Secondary | ICD-10-CM | POA: Insufficient documentation

## 2018-06-10 DIAGNOSIS — R8290 Unspecified abnormal findings in urine: Secondary | ICD-10-CM

## 2018-06-10 DIAGNOSIS — D631 Anemia in chronic kidney disease: Secondary | ICD-10-CM | POA: Insufficient documentation

## 2018-06-10 DIAGNOSIS — E785 Hyperlipidemia, unspecified: Secondary | ICD-10-CM

## 2018-06-10 DIAGNOSIS — M1990 Unspecified osteoarthritis, unspecified site: Secondary | ICD-10-CM | POA: Insufficient documentation

## 2018-06-10 DIAGNOSIS — G8929 Other chronic pain: Secondary | ICD-10-CM | POA: Insufficient documentation

## 2018-06-10 DIAGNOSIS — J449 Chronic obstructive pulmonary disease, unspecified: Secondary | ICD-10-CM | POA: Diagnosis not present

## 2018-06-10 DIAGNOSIS — N189 Chronic kidney disease, unspecified: Secondary | ICD-10-CM

## 2018-06-10 LAB — CBC WITH DIFFERENTIAL/PLATELET
Basophils Absolute: 0 10*3/uL (ref 0–0.1)
Basophils Relative: 1 %
Eosinophils Absolute: 0.6 10*3/uL (ref 0–0.7)
Eosinophils Relative: 11 %
HCT: 21.6 % — ABNORMAL LOW (ref 35.0–47.0)
Hemoglobin: 7.3 g/dL — ABNORMAL LOW (ref 12.0–16.0)
Lymphocytes Relative: 30 %
Lymphs Abs: 1.6 10*3/uL (ref 1.0–3.6)
MCH: 30.2 pg (ref 26.0–34.0)
MCHC: 33.7 g/dL (ref 32.0–36.0)
MCV: 89.8 fL (ref 80.0–100.0)
Monocytes Absolute: 0.5 10*3/uL (ref 0.2–0.9)
Monocytes Relative: 9 %
Neutro Abs: 2.7 10*3/uL (ref 1.4–6.5)
Neutrophils Relative %: 49 %
Platelets: 98 10*3/uL — ABNORMAL LOW (ref 150–440)
RBC: 2.4 MIL/uL — ABNORMAL LOW (ref 3.80–5.20)
RDW: 15.1 % — ABNORMAL HIGH (ref 11.5–14.5)
WBC: 5.5 10*3/uL (ref 3.6–11.0)

## 2018-06-10 LAB — COMPREHENSIVE METABOLIC PANEL
ALT: 15 U/L (ref 0–44)
AST: 21 U/L (ref 15–41)
Albumin: 3.3 g/dL — ABNORMAL LOW (ref 3.5–5.0)
Alkaline Phosphatase: 93 U/L (ref 38–126)
Anion gap: 11 (ref 5–15)
BUN: 48 mg/dL — ABNORMAL HIGH (ref 6–20)
CO2: 27 mmol/L (ref 22–32)
Calcium: 8.2 mg/dL — ABNORMAL LOW (ref 8.9–10.3)
Chloride: 94 mmol/L — ABNORMAL LOW (ref 98–111)
Creatinine, Ser: 6.22 mg/dL — ABNORMAL HIGH (ref 0.44–1.00)
GFR calc Af Amer: 8 mL/min — ABNORMAL LOW (ref >60)
GFR calc non Af Amer: 7 mL/min — ABNORMAL LOW (ref >60)
Glucose, Bld: 352 mg/dL — ABNORMAL HIGH (ref 70–99)
Potassium: 4.2 mmol/L (ref 3.5–5.1)
Sodium: 132 mmol/L — ABNORMAL LOW (ref 135–145)
Total Bilirubin: 0.4 mg/dL (ref 0.3–1.2)
Total Protein: 6.7 g/dL (ref 6.5–8.1)

## 2018-06-10 NOTE — Progress Notes (Signed)
Hematology/Oncology Consult note [facility]  [phone] [phone]  Patient Care Team: Freddy Finner, NP as PCP - General (Nurse Practitioner) Lavonia Dana, MD as Consulting Physician (Internal Medicine)   Name of the patient: [name]  [id number]  [birth year]   Date of visit: 06/10/18  Diagnosis- 1. Anemia of chronic kidney disease  2. MGUS  Chief complaint/ Reason for visit- routine f/u of M spike in urine  Heme/Onc history: patient is a 57 year old female with a past medical history significant for coronary artery disease, type 2 diabetes with neuropathy, hypertension hyperlipidemia and osteopenia arthritis. She has ESRD secondary to HTN/DM and has been on dialysis for about 14 years. She was seen by rheumatology for complaints of low back pain. She also had painful subcutaneous skin rashes and was seen by dermatology and biopsy of one of the lesions showed mild septal panicky like this. As a part of workup by rheumatology doctor Dr. Lezlie Lye- Meda Coffee she had SPEP done which came back abnormal with the presence of M spike and hence has been referred to [place]. There was still some ongoing concern about possible lupus and she follows up with rheumatology. Her last colonoscopy was in November 2017 which showed tubular adenoma and a repeat colonoscopy was recommended in 5 years.   CBC from 01/08/2017 showed white count of 6.7, H&H of 13.9/42.8 and a platelet count of 161. CMP showed elevated BUN of 43 and creatinine of 5.22. Calcium was normal at 9.7. Total protein was mildly elevated at 8.6. An albumin was normal at 3.9. SPEP from 11/06/2016 did not reveal any M spike. M spike of 4.2% was noted on 24-hour urine protein. A lambda free light chain ratio was normal although both And lambda were elevated due to chronic kidney disease. Patient has been unable to give [place] 24 urine sample to test for M protein   Interval history-patient was receiving EPO  for anemia of chronic kidney disease until a week ago.  However she also has known kidney mass for which he sees Dr. Erlene Quan.  She was noted to have a new 6 mm lesion in the right kidney in addition to the existing 12 mm lesion.  Hence her erythropoietin was stopped a week ago.  She feels fatigued today but denies other complaints  ECOG PS- 1 Pain scale- 4- chronic back pain Opioid associated constipation- no  Review of systems- Review of Systems  Constitutional: Positive for malaise/fatigue. Negative for chills, fever and weight loss.  HENT: Negative for congestion, ear discharge and nosebleeds.   Eyes: Negative for blurred vision.  Respiratory: Negative for cough, hemoptysis, sputum production, shortness of breath and wheezing.   Cardiovascular: Negative for chest pain, palpitations, orthopnea and claudication.  Gastrointestinal: Negative for abdominal pain, blood in stool, constipation, diarrhea, heartburn, melena, nausea and vomiting.  Genitourinary: Negative for dysuria, flank pain, frequency, hematuria and urgency.  Musculoskeletal: Positive for back pain. Negative for joint pain and myalgias.  Skin: Negative for rash.  Neurological: Negative for dizziness, tingling, focal weakness, seizures, weakness and headaches.  Endo/Heme/Allergies: Does not bruise/bleed easily.  Psychiatric/Behavioral: Negative for depression and suicidal ideas. The patient does not have insomnia.        Allergies  Allergen Reactions  . Ace Inhibitors Swelling and Anaphylaxis  . Ativan [Lorazepam] Other (See Comments)    Reaction:Hallucinations and headaches  . Compazine [Prochlorperazine Edisylate] Anaphylaxis, Nausea And Vomiting and Other (See Comments)    Other reaction(s): dystonia from this vs. Reglan,  23 Jul - patient relates that she takes promethazine frequently with no problems  . Sumatriptan Succinate Other (See Comments)    Other reaction(s): delirium and hallucinations per [facility] records  .  Dilaudid [Hydromorphone Hcl] Other (See Comments)    Delirium   . Ondansetron Other (See Comments)    hallucinations    . Zofran [Ondansetron Hcl] Other (See Comments)    Reaction:  hallucinations   . Codeine Nausea And Vomiting  . Gabapentin Other (See Comments)    Reaction:  Unknown    . Lac Bovis Nausea And Vomiting  . Losartan Nausea Only  . Oxycodone Anxiety  . Prochlorperazine Other (See Comments)    Reaction:  Unknown . Patient does not remember reaction but she does have vertigo and anxiety along with n and v at times. Could be used to treat any of these   . Reglan [Metoclopramide] Other (See Comments)    Per patient her Dr. Evelina Bucy her off it   . Scopolamine Other (See Comments)    Dizziness, also has vertigo already  . Tape Rash  . Tapentadol Rash     Past Medical History:  Diagnosis Date  . Anemia   . Anginal pain ([place])   . Anxiety   . Arthritis   . Asthma   . Broken wrist   . Bronchitis   . chronic diastolic CHF 6/71/2458  . COPD (chronic obstructive pulmonary disease) ([place])   . Coronary artery disease    a. cath 2013: stenting to RCA (report not available); b. cath 2014: LM nl, pLAD 40%, mLAD nl, ost LCx 40%, mid LCx nl, pRCA 30% @ site of prior stent, mRCA 50%  . Depression   . Diabetes mellitus without complication ([place])   . Diabetic neuropathy ([place])   . dialysis 2006  . Diverticulosis   . Dizziness   . Dyspnea   . Elevated lipids   . Environmental and seasonal allergies   . ESRD (end stage renal disease) on dialysis ([place])    M-W-F  . GERD (gastroesophageal reflux disease)   . Headache   . History of hiatal hernia   . HOH (hard of hearing)   . Hx of pancreatitis 2015  . Hypertension   . Lower extremity edema   . Mitral regurgitation    a. echo 10/2013: EF 62%, noWMA, mildly dilated LA, mild to mod MR/TR, GR1DD  . Myocardial infarction ([place])   . Orthopnea   . Pneumonia   . Renal cancer ([place])   . Renal insufficiency    Pt is on dialysis on M,W  + F.  . Wheezing      Past Surgical History:  Procedure Laterality Date  . A/V SHUNTOGRAM Left 01/20/2018   Procedure: A/V SHUNTOGRAM;  Surgeon: Algernon Huxley, MD;  Location: [place] [facility];  Service: Cardiovascular;  Laterality: Left;  . ABDOMINAL HYSTERECTOMY  1992  . AMPUTATION TOE Left 10/02/2017   Procedure: AMPUTATION TOE-LEFT GREAT TOE;  Surgeon: Albertine Patricia, DPM;  Location: [facility] ORS;  Service: Podiatry;  Laterality: Left;  . APPENDECTOMY    . CARDIAC CATHETERIZATION Left 07/26/2015   Procedure: Left Heart Cath and Coronary Angiography;  Surgeon: Dionisio David, MD;  Location: [place] [facility];  Service: Cardiovascular;  Laterality: Left;  . CATARACT EXTRACTION W/ INTRAOCULAR LENS IMPLANT Right   . CATARACT EXTRACTION W/PHACO Left 03/10/2017   Procedure: CATARACT EXTRACTION PHACO AND INTRAOCULAR LENS PLACEMENT (IOC);  Surgeon: Birder Robson, MD;  Location: [facility] OR[facility];  Service:  Ophthalmology;  Laterality: Left;  [place] 00:51.9 AP% 14.2 CDE 7.39 fluid pack lot # [id number]  . CHOLECYSTECTOMY    . COLONOSCOPY WITH PROPOFOL N/A 08/12/2016   Procedure: COLONOSCOPY WITH PROPOFOL;  Surgeon: Lollie Sails, MD;  Location[facility] END[facility]COPY;  Service: Endoscopy;  Laterality: N/A;  . DIALYSIS FISTULA CREATION Left    upper arm  . dialysis grafts    . ESOPHAGOGASTRODUODENOSCOPY N/A 03/08/2015   Procedure: ESOPHAGOGASTRODUODENOSCOPY (EGD);  Surgeon: Manya Silvas, MD;  Location: [facility] ENDOSCOPY;  Service: Endoscopy;  Laterality: N/A;  . ESOPHAGOGASTRODUODENOSCOPY (EGD) WITH PROPOFOL N/A 03/18/2016   Procedure: ESOPHAGOGASTRODUODENOSCOPY (EGD) WITH PROPOFOL;  Surgeon: [name], MD;  Location: [facility] ENDOSCOPY;  Service: Endoscopy;  Laterality: N/A;  . EYE SURGERY Right 2018  . FECAL TRANSPLANT N/A 08/23/2015   Procedure: FECAL TRANSPLANT;  Surgeon: Manya Silvas, MD;  Location: [facility] ENDOSCOPY;  Service: Endoscopy;  Laterality: N/A;  . HAND SURGERY Bilateral   . PERIPHERAL  VASCULAR CATHETERIZATION N/A 12/20/2015   Procedure: Thrombectomy of dialysis access versus permcath placement;  Surgeon: Algernon Huxley, MD;  Location: [place] [facility];  Service: Cardiovascular;  Laterality: N/A;  . PERIPHERAL VASCULAR CATHETERIZATION N/A 12/20/2015   Procedure: A/V Shunt Intervention;  Surgeon: Algernon Huxley, MD;  Location: [place] [facility];  Service: Cardiovascular;  Laterality: N/A;  . PERIPHERAL VASCULAR CATHETERIZATION N/A 12/20/2015   Procedure: A/V Shuntogram/Fistulagram;  Surgeon: Algernon Huxley, MD;  Location: [place] [facility];  Service: Cardiovascular;  Laterality: N/A;  . PERIPHERAL VASCULAR CATHETERIZATION N/A 01/02/2016   Procedure: A/V Shuntogram/Fistulagram;  Surgeon: Algernon Huxley, MD;  Location: [place] [facility];  Service: Cardiovascular;  Laterality: N/A;  . PERIPHERAL VASCULAR CATHETERIZATION N/A 01/02/2016   Procedure: A/V Shunt Intervention;  Surgeon: Algernon Huxley, MD;  Location: [place] [facility];  Service: Cardiovascular;  Laterality: N/A;    Social History   Socioeconomic History  . Marital status: Divorced    Spouse name: Not on file  . Number of children: Not on file  . Years of education: Not on file  . Highest education level: Not on file  Occupational History  . Not on file  Social Needs  . Financial resource strain: Not hard at all  . Food insecurity:    Worry: Never true    Inability: Never true  . Transportation needs:    Medical: No    Non-medical: No  Tobacco Use  . Smoking status: Former Smoker    Packs/day: 0.50    Years: 40.00    Pack years: 20.00    Types: Cigarettes    Last attempt to quit: 02/13/2015    Years since quitting: 3.3  . Smokeless tobacco: Never Used  Substance and Sexual Activity  . Alcohol use: Not Currently    Comment: glass wine week per pt  . Drug use: Yes    Types: Marijuana    Comment: once a day  . Sexual activity: Never  Lifestyle  . Physical activity:    Days per week: 7 days    Minutes  per session: 10 min  . Stress: Very much  Relationships  . Social connections:    Talks on phone: More than three times a week    Gets together: More than three times a week    Attends religious service: More than 4 times per year    Active member of club or organization: Yes    Attends meetings of clubs or organizations: Never  Relationship status: Divorced  . Intimate partner violence:    Fear of current or ex partner: No    Emotionally abused: No    Physically abused: No    Forced sexual activity: No  Other Topics Concern  . Not on file  Social History Narrative  . Not on file    Family History  Problem Relation Age of Onset  . Kidney disease Mother   . Diabetes Mother   . Cancer Father   . Kidney disease Sister      Current Outpatient Medications:  .  amLODipine (NORVASC) 10 MG tablet, Take 10 mg by mouth daily., Disp: , Rfl:  .  aspirin EC 81 MG tablet, Take 81 mg by mouth daily., Disp: , Rfl:  .  atorvastatin (LIPITOR) 20 MG tablet, Take 20 mg by mouth every evening. , Disp: , Rfl:  .  Biotin 1 MG CAPS, Take 1 mg by mouth daily., Disp: , Rfl:  .  cetirizine (ZYRTEC) 10 MG tablet, Take 10 mg by mouth daily. , Disp: , Rfl:  .  cholecalciferol (VITAMIN D) 400 units TABS tablet, Take 400 Units by mouth daily., Disp: , Rfl:  .  cyanocobalamin 1000 MCG tablet, Take 1,000 mcg by mouth daily. , Disp: , Rfl:  .  FLUoxetine (PROZAC) 20 MG capsule, Take 1 capsule (20 mg total) by mouth daily. (Patient taking differently: Take 20 mg by mouth at bedtime. ), Disp: 30 capsule, Rfl: 0 .  HYDROcodone-acetaminophen (NORCO/VICODIN) 5-325 MG tablet, Take 1 tablet by mouth every 6 (six) hours as needed for moderate pain or severe pain. (Patient taking differently: Take 1 tablet by mouth 3 (three) times daily as needed for moderate pain or severe pain. ), Disp: 15 tablet, Rfl: 0 .  insulin aspart (NOVOLOG FLEXPEN) 100 UNIT/ML FlexPen, Inject 4-15 Units into the skin 4 (four) times daily as  needed for high blood sugar (> 100, per sliding scale)., Disp: , Rfl:  .  lipase/protease/amylase (CREON) 12000 UNITS CPEP capsule, Take 36000 mg in the morning, 24000 midday & 36000 in the evening, Disp: , Rfl:  .  multivitamin (RENA-VIT) TABS tablet, Take 1 tablet by mouth daily. , Disp: , Rfl:  .  ranitidine (ZANTAC) 300 MG capsule, Take 1 capsule by mouth every evening., Disp: , Rfl: 1 .  rifaximin (XIFAXAN) 550 MG TABS tablet, Take 550 mg by mouth 2 (two) times daily., Disp: , Rfl:  .  sevelamer carbonate (RENVELA) 800 MG tablet, Take 800-2,400 mg by mouth See admin instructions. Take 2400 mg by mouth 3 times daily with meals and take 800 mg by mouth with snacks, Disp: , Rfl:  .  albuterol (PROVENTIL HFA;VENTOLIN HFA) 108 (90 Base) MCG/ACT inhaler, Inhale 2 puffs into the lungs every 6 (six) hours as needed for wheezing or shortness of breath. , Disp: , Rfl:  .  albuterol (PROVENTIL HFA;VENTOLIN HFA) 108 (90 Base) MCG/ACT inhaler, Inhale 2 puffs into the lungs every 6 (six) hours as needed for wheezing or shortness of breath. (Patient not taking: Reported on 06/10/2018), Disp: 1 Inhaler, Rfl: 2 .  albuterol (PROVENTIL) (2.5 MG/3ML) 0.083% nebulizer solution, Take 2.5 mg by nebulization every 4 (four) hours as needed for wheezing or shortness of breath., Disp: , Rfl:  .  diclofenac sodium (VOLTAREN) 1 % GEL, Apply 1 application topically daily as needed (for rash)., Disp: , Rfl:  .  ethyl chloride spray, Apply 1 application topically 3 (three) times a week., Disp: , Rfl: 11 .  glucose 4 GM chewable tablet, Chew 3-4 tablets by mouth as needed for low blood sugar (blood sugar less than 60). , Disp: , Rfl:  .  hydrocortisone cream 0.5 %, Apply topically 3 (three) times daily. (Patient not taking: Reported on 06/10/2018), Disp: 30 g, Rfl: 0 .  lidocaine (LIDODERM) 5 %, Place 1 patch onto the skin every 12 (twelve) hours. Remove & Discard patch within 12 hours or as directed by MD (Patient not taking:  Reported on 06/10/2018), Disp: 10 patch, Rfl: 0 .  meclizine (ANTIVERT) 25 MG tablet, Take 25 mg by mouth every 6 (six) hours as needed for dizziness., Disp: , Rfl:  .  nitroGLYCERIN (NITROSTAT) 0.4 MG SL tablet, Place 0.4 mg under the tongue every 5 (five) minutes as needed for chest pain. , Disp: , Rfl:   Physical exam:  Vitals:   06/10/18 1052  BP: (!) 161/67  Pulse: 60  Resp: 18  Temp: 97.9 F (36.6 C)  TempSrc: Tympanic  SpO2: 100%  Weight: 194 lb 9.6 oz (88.3 kg)  Height: 5\' 5"  (1.651 m)   Physical Exam  Constitutional: She is oriented to person, place, and time. She appears well-developed and well-nourished.  HENT:  Head: Normocephalic and atraumatic.  Eyes: Pupils are equal, round, and reactive to light. EOM are normal.  Neck: Normal range of motion.  Cardiovascular: Normal rate, regular rhythm and normal heart sounds.  Pulmonary/Chest: Effort normal and breath sounds normal.  Abdominal: Soft. Bowel sounds are normal.  Neurological: She is alert and oriented to person, place, and time.  Skin: Skin is warm and dry.     CMP Latest Ref Rng & Units 06/10/2018  Glucose 70 - 99 mg/dL 352(H)  BUN 6 - 20 mg/dL 48(H)  Creatinine 0.44 - 1.00 mg/dL 6.22(H)  Sodium 135 - 145 mmol/L 132(L)  Potassium 3.5 - 5.1 mmol/L 4.2  Chloride 98 - 111 mmol/L 94(L)  CO2 22 - 32 mmol/L 27  Calcium 8.9 - 10.3 mg/dL 8.2(L)  Total Protein 6.5 - 8.1 g/dL 6.7  Total Bilirubin 0.3 - 1.2 mg/dL 0.4  Alkaline Phos 38 - 126 U/L 93  AST 15 - 41 U/L 21  ALT 0 - 44 U/L 15   CBC Latest Ref Rng & Units 06/10/2018  WBC 3.6 - 11.0 K/uL 5.5  Hemoglobin 12.0 - 16.0 g/dL 7.3(L)  Hematocrit 35.0 - 47.0 % 21.6(L)  Platelets 150 - 440 K/uL 98(L)    No images are attached to the encounter.  Dg Wrist Complete Left  Result Date: 06/03/2018 CLINICAL DATA:  Pain and swelling at LEFT wrist, prior fracture EXAM: LEFT WRIST - COMPLETE 3+ VIEW COMPARISON:  CT wrist 07/14/2017 radiographs 02/19/2017 FINDINGS:  Osseous mineralization diffusely decreased. Joint spaces preserved. Tiny old non fused fracture fragment adjacent to the ulnar styloid. Deformity of the distal LEFT radius secondary to old healed metaphyseal fracture. Fracture line is no longer visible. Significant volar tilt of distal radial articular surface. No acute fracture, dislocation, or bone destruction. Small vessel vascular calcifications at radial artery. IMPRESSION: Nonunion of old ulnar styloid fracture fragment. Deformity of distal LEFT radius from old healed metaphyseal fracture with significant volar tilt of distal radial articular surface. Electronically Signed   By: Lavonia Dana M.D.   On: 06/03/2018 21:50     Assessment and plan- Patient is a 57 y.o. female with following issues:  1.  Normocytic anemia: I suspect this is secondary to chronic kidney disease.  Her hemoglobin typically ranges between 10-11 while  she was on Depo.  She has not received any for over the last 1 week due to concerns of new 11 mm subcapsular mass noted on CT scan concerning for renal cell carcinoma.  Today I will repeat a ferritin and iron studies as well as B12 and folate reticulocyte count and haptoglobin to see if there is any other cause contributing to her anemia other than chronic kidney disease.  Her hemoglobin is significantly low at 7.3 probably due to lack of EPO.  I will see her back in 1 month's time with CBC with differential and possible transfusion  2. Renal mass: I have advised her to follow-up with Dr. Erlene Quan tomorrow as scheduled instead of postponing the appointment to 1 months from now.  She may not be able to get equal which is critical for improvement of her anemia if the renal mass is not taking care of.  3. M spike in urine: none noted in serum. Myeloma panel and free light chain pending from today   Visit Diagnosis 1. Normocytic anemia   2. Anemia of chronic renal failure, unspecified CKD stage      Dr. Randa Evens, MD, MPH [facility]  at [facility] [phone] 06/10/2018 12:03 PM

## 2018-06-11 ENCOUNTER — Ambulatory Visit (INDEPENDENT_AMBULATORY_CARE_PROVIDER_SITE_OTHER): Payer: Medicare Other | Admitting: Urology

## 2018-06-11 ENCOUNTER — Encounter: Payer: Self-pay | Admitting: Urology

## 2018-06-11 ENCOUNTER — Encounter

## 2018-06-11 DIAGNOSIS — N2889 Other specified disorders of kidney and ureter: Secondary | ICD-10-CM | POA: Diagnosis not present

## 2018-06-11 DIAGNOSIS — I6523 Occlusion and stenosis of bilateral carotid arteries: Secondary | ICD-10-CM

## 2018-06-11 LAB — KAPPA/LAMBDA LIGHT CHAINS
Kappa free light chain: 219.8 mg/L — ABNORMAL HIGH (ref 3.3–19.4)
Kappa, lambda light chain ratio: 1.1 (ref 0.26–1.65)
Lambda free light chains: 199.2 mg/L — ABNORMAL HIGH (ref 5.7–26.3)

## 2018-06-11 LAB — MULTIPLE MYELOMA PANEL, SERUM
Albumin SerPl Elph-Mcnc: 3.5 g/dL (ref 2.9–4.4)
Albumin/Glob SerPl: 1.3 (ref 0.7–1.7)
Alpha 1: 0.2 g/dL (ref 0.0–0.4)
Alpha2 Glob SerPl Elph-Mcnc: 0.5 g/dL (ref 0.4–1.0)
B-Globulin SerPl Elph-Mcnc: 0.8 g/dL (ref 0.7–1.3)
Gamma Glob SerPl Elph-Mcnc: 1.2 g/dL (ref 0.4–1.8)
Globulin, Total: 2.7 g/dL (ref 2.2–3.9)
IgA: 362 mg/dL — ABNORMAL HIGH (ref 87–352)
IgG (Immunoglobin G), Serum: 1271 mg/dL (ref 700–1600)
IgM (Immunoglobulin M), Srm: 80 mg/dL (ref 26–217)
Total Protein ELP: 6.2 g/dL (ref 6.0–8.5)

## 2018-06-11 NOTE — Progress Notes (Signed)
06/11/2018 7:59 PM   [name] 1961-06-22 [id number]  Referring provider: Ellamae Sia, MD [address] [place], [place] [address]  Chief Complaint  Patient presents with  . Follow-up    HPI: 57 y.o.year old F with chronic renal disease on hemodialysis, CAD, DM with neuropathy, COPD, chronic diastolic heart failure followed for incidental small right renal masses.  Review of previous CT scan from 02/22/2015 to show presence of these tumors although with a more subtle appearance but relatively unchanged in size.  CT abd with and without contrast performed on 08/01/16 shows an enhancing lateral upper pole right renal lesion measuring 11 mm, 1.3 as well as a 6 mm medial upper pole lesion which are unchanged in size since previous imaging.    Follow up scan on 01/29/2017 shows unchanged renal masses. Her lymphadenopathy is stable.  Interval CT abdomen pelvis with and without contrast on 03/02/2018 shows no significant change in the 12 mm posterior mid pole right renal mass.     She denies any flank pain or gross hematuria.  Due to being on hemodialysis, she voids only a small amount every other day.  No UTIs.  This is unchanged from previous visits.  Mostly mentions today that Dr. Janese Banks is concerned about giving her Epogen due to concern for new renal mass.  She is anemic and her energy levels are decreased due to her chronic and worsening anemia.  Her daughter mentions today that she is concerned about her marijuana abuse.  Patient states that helps with her appetite and mood.  PMH: Past Medical History:  Diagnosis Date  . Anemia   . Anginal pain ([place])   . Anxiety   . Arthritis   . Asthma   . Broken wrist   . Bronchitis   . chronic diastolic CHF 01/14/9562  . COPD (chronic obstructive pulmonary disease) ([place])   . Coronary artery disease    a. cath 2013: stenting to RCA (report not available); b. cath 2014: LM nl, pLAD 40%, mLAD nl, ost LCx 40%, mid LCx nl,  pRCA 30% @ site of prior stent, mRCA 50%  . Depression   . Diabetes mellitus without complication ([place])   . Diabetic neuropathy ([place])   . dialysis 2006  . Diverticulosis   . Dizziness   . Dyspnea   . Elevated lipids   . Environmental and seasonal allergies   . ESRD (end stage renal disease) on dialysis ([place])    M-W-F  . GERD (gastroesophageal reflux disease)   . Headache   . History of hiatal hernia   . HOH (hard of hearing)   . Hx of pancreatitis 2015  . Hypertension   . Lower extremity edema   . Mitral regurgitation    a. echo 10/2013: EF 62%, noWMA, mildly dilated LA, mild to mod MR/TR, GR1DD  . Myocardial infarction ([place])   . Orthopnea   . Pneumonia   . Renal cancer ([place])   . Renal insufficiency    Pt is on dialysis on M,W + F.  . Wheezing     Surgical History: Past Surgical History:  Procedure Laterality Date  . A/V SHUNTOGRAM Left 01/20/2018   Procedure: A/V SHUNTOGRAM;  Surgeon: Algernon Huxley, MD;  Location: [place] [facility];  Service: Cardiovascular;  Laterality: Left;  . ABDOMINAL HYSTERECTOMY  1992  . AMPUTATION TOE Left 10/02/2017   Procedure: AMPUTATION TOE-LEFT GREAT TOE;  Surgeon: Albertine Patricia, DPM;  Location: [facility];  Service: Podiatry;  Laterality: Left;  . APPENDECTOMY    . CARDIAC CATHETERIZATION Left 07/26/2015   Procedure: Left Heart Cath and Coronary Angiography;  Surgeon: Dionisio David, MD;  Location: [place] [facility];  Service: Cardiovascular;  Laterality: Left;  . CATARACT EXTRACTION W/ INTRAOCULAR LENS IMPLANT Right   . CATARACT EXTRACTION W/PHACO Left 03/10/2017   Procedure: CATARACT EXTRACTION PHACO AND INTRAOCULAR LENS PLACEMENT (IOC);  Surgeon: Birder Robson, MD;  Location: [facility];  Service: Ophthalmology;  Laterality: Left;  [place] 00:51.9 AP% 14.2 CDE 7.39 fluid pack lot # [id number]  . CHOLECYSTECTOMY    . COLONOSCOPY WITH PROPOFOL N/A 08/12/2016   Procedure: COLONOSCOPY WITH PROPOFOL;  Surgeon: Lollie Sails, MD;  Location:  [facility];  Service: Endoscopy;  Laterality: N/A;  . DIALYSIS FISTULA CREATION Left    upper arm  . dialysis grafts    . ESOPHAGOGASTRODUODENOSCOPY N/A 03/08/2015   Procedure: ESOPHAGOGASTRODUODENOSCOPY (EGD);  Surgeon: Manya Silvas, MD;  Location: [facility] ENDOSCOPY;  Service: Endoscopy;  Laterality: N/A;  . ESOPHAGOGASTRODUODENOSCOPY (EGD) WITH PROPOFOL N/A 03/18/2016   Procedure: ESOPHAGOGASTRODUODENOSCOPY (EGD) WITH PROPOFOL;  Surgeon: [name], MD;  Location: [facility] ENDOSCOPY;  Service: Endoscopy;  Laterality: N/A;  . EYE SURGERY Right 2018  . FECAL TRANSPLANT N/A 08/23/2015   Procedure: FECAL TRANSPLANT;  Surgeon: Manya Silvas, MD;  Location: [facility]COPY;  Service: Endoscopy;  Laterality: N/A;  . HAND SURGERY Bilateral   . PERIPHERAL VASCULAR CATHETERIZATION N/A 12/20/2015   Procedure: Thrombectomy of dialysis access versus permcath placement;  Surgeon: Algernon Huxley, MD;  Location: [place] [facility];  Service: Cardiovascular;  Laterality: N/A;  . PERIPHERAL VASCULAR CATHETERIZATION N/A 12/20/2015   Procedure: A/V Shunt Intervention;  Surgeon: Algernon Huxley, MD;  Location: [place] [facility];  Service: Cardiovascular;  Laterality: N/A;  . PERIPHERAL VASCULAR CATHETERIZATION N/A 12/20/2015   Procedure: A/V Shuntogram/Fistulagram;  Surgeon: Algernon Huxley, MD;  Location: [place] [facility];  Service: Cardiovascular;  Laterality: N/A;  . PERIPHERAL VASCULAR CATHETERIZATION N/A 01/02/2016   Procedure: A/V Shuntogram/Fistulagram;  Surgeon: Algernon Huxley, MD;  Location: [place] [facility];  Service: Cardiovascular;  Laterality: N/A;  . PERIPHERAL VASCULAR CATHETERIZATION N/A 01/02/2016   Procedure: A/V Shunt Intervention;  Surgeon: Algernon Huxley, MD;  Location: [place] [facility];  Service: Cardiovascular;  Laterality: N/A;    Home Medications:  Allergies as of 06/11/2018      Reactions   Ace Inhibitors Swelling, Anaphylaxis   Ativan [lorazepam] Other (See Comments)    Reaction:Hallucinations and headaches   Compazine [prochlorperazine Edisylate] Anaphylaxis, Nausea And Vomiting, Other (See Comments)   Other reaction(s): dystonia from this vs. Reglan, 23 Jul - patient relates that she takes promethazine frequently with no problems   Sumatriptan Succinate Other (See Comments)   Other reaction(s): delirium and hallucinations per [facility] records   Dilaudid [hydromorphone Hcl] Other (See Comments)   Delirium   Ondansetron Other (See Comments)   hallucinations    Zofran [ondansetron Hcl] Other (See Comments)   Reaction:  hallucinations    Codeine Nausea And Vomiting   Gabapentin Other (See Comments)   Reaction:  Unknown    Lac Bovis Nausea And Vomiting   Losartan Nausea Only   Oxycodone Anxiety   Prochlorperazine Other (See Comments)   Reaction:  Unknown . Patient does not remember reaction but she does have vertigo and anxiety along with n and v at times. Could be used to treat any of these   Reglan [metoclopramide] Other (See Comments)  Per patient her Dr. Evelina Bucy her off it    Scopolamine Other (See Comments)   Dizziness, also has vertigo already   Tape Rash   Tapentadol Rash      Medication List        Accurate as of 06/11/18 11:59 PM. Always use your most recent med list.          albuterol (2.5 MG/3ML) 0.083% nebulizer solution Commonly known as:  PROVENTIL Take 2.5 mg by nebulization every 4 (four) hours as needed for wheezing or shortness of breath.   albuterol 108 (90 Base) MCG/ACT inhaler Commonly known as:  PROVENTIL HFA;VENTOLIN HFA Inhale 2 puffs into the lungs every 6 (six) hours as needed for wheezing or shortness of breath.   albuterol 108 (90 Base) MCG/ACT inhaler Commonly known as:  PROVENTIL HFA;VENTOLIN HFA Inhale 2 puffs into the lungs every 6 (six) hours as needed for wheezing or shortness of breath.   amLODipine 10 MG tablet Commonly known as:  NORVASC Take 10 mg by mouth daily.   aspirin EC 81 MG tablet Take 81 mg  by mouth daily.   atorvastatin 20 MG tablet Commonly known as:  LIPITOR Take 20 mg by mouth every evening.   Biotin 1 MG Caps Take 1 mg by mouth daily.   cetirizine 10 MG tablet Commonly known as:  ZYRTEC Take 10 mg by mouth daily.   cholecalciferol 400 units Tabs tablet Commonly known as:  VITAMIN D Take 400 Units by mouth daily.   cyanocobalamin 1000 MCG tablet Take 1,000 mcg by mouth daily.   diclofenac sodium 1 % Gel Commonly known as:  VOLTAREN Apply 1 application topically daily as needed (for rash).   ethyl chloride spray Apply 1 application topically 3 (three) times a week.   FLUoxetine 20 MG capsule Commonly known as:  PROZAC Take 1 capsule (20 mg total) by mouth daily.   glucose 4 GM chewable tablet Chew 3-4 tablets by mouth as needed for low blood sugar (blood sugar less than 60).   HYDROcodone-acetaminophen 5-325 MG tablet Commonly known as:  NORCO/VICODIN Take 1 tablet by mouth every 6 (six) hours as needed for moderate pain or severe pain.   hydrocortisone cream 0.5 % Apply topically 3 (three) times daily.   lidocaine 5 % Commonly known as:  LIDODERM Place 1 patch onto the skin every 12 (twelve) hours. Remove & Discard patch within 12 hours or as directed by MD   lipase/protease/amylase 12000 units Cpep capsule Commonly known as:  CREON Take 36000 mg in the morning, 24000 midday & 36000 in the evening   meclizine 25 MG tablet Commonly known as:  ANTIVERT Take 25 mg by mouth every 6 (six) hours as needed for dizziness.   multivitamin Tabs tablet Take 1 tablet by mouth daily.   nitroGLYCERIN 0.4 MG SL tablet Commonly known as:  NITROSTAT Place 0.4 mg under the tongue every 5 (five) minutes as needed for chest pain.   NOVOLOG FLEXPEN 100 UNIT/ML FlexPen Generic drug:  insulin aspart Inject 4-15 Units into the skin 4 (four) times daily as needed for high blood sugar (> 100, per sliding scale).   ranitidine 300 MG capsule Commonly known as:   ZANTAC Take 1 capsule by mouth every evening.   rifaximin 550 MG Tabs tablet Commonly known as:  XIFAXAN Take 550 mg by mouth 2 (two) times daily.   sevelamer carbonate 800 MG tablet Commonly known as:  RENVELA Take 800-2,400 mg by mouth See admin instructions. Take  2400 mg by mouth 3 times daily with meals and take 800 mg by mouth with snacks       Allergies:  Allergies  Allergen Reactions  . Ace Inhibitors Swelling and Anaphylaxis  . Ativan [Lorazepam] Other (See Comments)    Reaction:Hallucinations and headaches  . Compazine [Prochlorperazine Edisylate] Anaphylaxis, Nausea And Vomiting and Other (See Comments)    Other reaction(s): dystonia from this vs. Reglan, 23 Jul - patient relates that she takes promethazine frequently with no problems  . Sumatriptan Succinate Other (See Comments)    Other reaction(s): delirium and hallucinations per [facility] records  . Dilaudid [Hydromorphone Hcl] Other (See Comments)    Delirium   . Ondansetron Other (See Comments)    hallucinations    . Zofran [Ondansetron Hcl] Other (See Comments)    Reaction:  hallucinations   . Codeine Nausea And Vomiting  . Gabapentin Other (See Comments)    Reaction:  Unknown    . Lac Bovis Nausea And Vomiting  . Losartan Nausea Only  . Oxycodone Anxiety  . Prochlorperazine Other (See Comments)    Reaction:  Unknown . Patient does not remember reaction but she does have vertigo and anxiety along with n and v at times. Could be used to treat any of these   . Reglan [Metoclopramide] Other (See Comments)    Per patient her Dr. Evelina Bucy her off it   . Scopolamine Other (See Comments)    Dizziness, also has vertigo already  . Tape Rash  . Tapentadol Rash    Family History: Family History  Problem Relation Age of Onset  . Kidney disease Mother   . Diabetes Mother   . Cancer Father   . Kidney disease Sister     Social History:  reports that she quit smoking about 3 years ago. Her smoking use included  cigarettes. She has a 20.00 pack-year smoking history. She has never used smokeless tobacco. She reports that she drank alcohol. She reports that she has current or past drug history. Drug: Marijuana.  ROS: UROLOGY Frequent Urination?: No Hard to postpone urination?: No Burning/pain with urination?: No Get up at night to urinate?: No Leakage of urine?: No Urine stream starts and stops?: Yes Trouble starting stream?: No Do you have to strain to urinate?: Yes Blood in urine?: No Urinary tract infection?: No Sexually transmitted disease?: No Injury to kidneys or bladder?: No Painful intercourse?: No Weak stream?: No Currently pregnant?: No Vaginal bleeding?: No Last menstrual period?: n  Gastrointestinal Nausea?: Yes Vomiting?: Yes Indigestion/heartburn?: Yes Diarrhea?: Yes Constipation?: Yes  Constitutional Fever: No Night sweats?: Yes Weight loss?: No Fatigue?: No  Skin Skin rash/lesions?: Yes Itching?: Yes  Eyes Blurred vision?: Yes Double vision?: No  Ears/Nose/Throat Sore throat?: No Sinus problems?: No  Hematologic/Lymphatic Swollen glands?: No Easy bruising?: Yes  Cardiovascular Leg swelling?: Yes Chest pain?: No  Respiratory Cough?: No Shortness of breath?: No  Endocrine Excessive thirst?: No  Musculoskeletal Back pain?: Yes Joint pain?: Yes  Neurological Headaches?: Yes Dizziness?: Yes  Psychologic Depression?: Yes Anxiety?: Yes  Physical Exam: BP (!) 149/65 (BP Location: Right Arm, Patient Position: Sitting, Cuff Size: Normal)   Pulse 67   Ht 5\' 5"  (1.651 m)   Wt 190 lb 9.6 oz (86.5 kg)   BMI 31.72 kg/m   Constitutional:  Alert and oriented, No acute distress.   Accompanied by daughter today. HEENT: [place] AT, moist mucus membranes.  Trachea midline, no masses. Cardiovascular: No clubbing, cyanosis, or edema. Respiratory: Normal respiratory effort,  no increased work of breathing. GI: Abdomen is soft, nontender, nondistended, no  abdominal masses GU: No CVA tenderness Skin: No rashes, bruises or suspicious lesions. Neurologic: Grossly intact, no focal deficits, moving all 4 extremities. Psychiatric: Normal mood and affect.  Laboratory Data: Lab Results  Component Value Date   WBC 5.5 06/10/2018   HGB 7.3 (L) 06/10/2018   HCT 21.6 (L) 06/10/2018   MCV 89.8 06/10/2018   PLT 98 (L) 06/10/2018    Lab Results  Component Value Date   CREATININE 6.22 (H) 06/10/2018    Lab Results  Component Value Date   HGBA1C 6.5 (H) 09/04/2017    Urinalysis n/a  Pertinent Imaging: ADDENDUM REPORT: 06/11/2018 10:25  ADDENDUM: Comparison with multiple earlier CTs shows at the 6 mm hypervascular lesion in the anterior upper pole right kidney was visible on the 11/12/2015 exam. This lesion has not significant changed since that time, but remains suspicious for low-grade renal cell carcinoma. This was discussed by telephone with Dr. Erlene Quan at the time of this addendum.   Electronically Signed   By: Earle Gell M.D.   On: 06/11/2018 10:25   Addended by Earle Gell, MD on 06/11/2018 10:27 AM    Study Result   CLINICAL DATA:  Followup right renal mass.  EXAM: CT ABDOMEN AND PELVIS WITHOUT AND WITH CONTRAST  TECHNIQUE: Multidetector CT imaging of the abdomen and pelvis was performed following the standard protocol before and following the bolus administration of intravenous contrast.  CONTRAST:  155mL ISOVUE-300 IOPAMIDOL (ISOVUE-300) INJECTION 61%  COMPARISON:  01/17/2018  FINDINGS: Lower Chest: No acute findings. Stable mild right pleural thickening.  Hepatobiliary: No hepatic masses identified. Prior cholecystectomy. Mild diffuse biliary ductal dilatation remains stable.  Pancreas:  No mass or inflammatory changes.  Spleen: Within normal limits in size and appearance.  Adrenals/Urinary Tract: Normal adrenal glands. Diffuse bilateral renal parenchymal atrophy again seen with  extensive renal vascular calcification. No definite renal calculi are identified. No evidence of ureteral calculi or hydronephrosis.  A 12 x 11 mm hypervascular mass in the posterior midpole of the right kidney (image 68/series 8) is stable in size since previous study. A 6 mm enhancing subcapsular mass is seen in the anterior upper pole of the right kidney (image 63/series 8), which was not seen on prior exam. Both of these are suspicious for renal cell carcinoma. A tiny sub-cm cysts is again seen in the anterior lower pole of the right kidney. No evidence of left renal mass. Unremarkable urinary bladder for degree of bladder distention.  Stomach/Bowel: No evidence of obstruction, inflammatory process or abnormal fluid collections. Colonic diverticulosis again noted, without evidence of diverticulitis.  Vascular/Lymphatic: No pathologically enlarged lymph nodes. No abdominal aortic aneurysm. Aortic atherosclerosis. Extensive visceral vascular calcification also noted.  Reproductive: Prior hysterectomy noted. Adnexal regions are unremarkable in appearance. Trace amount of free fluid noted in pelvis.  Other:  None.  Musculoskeletal:  No suspicious bone lesions identified.  IMPRESSION: No significant change in 12 mm hypervascular mass in posterior midpole of right kidney, suspicious for renal cell carcinoma.  New 6 mm enhancing subcapsular mass in anterior upper pole of right kidney, also suspicious for a small renal cell carcinoma.  No evidence of metastatic disease.  Diffuse bilateral renal parenchymal atrophy.  Colonic diverticulosis, without radiographic evidence of diverticulitis.  Electronically Signed: By: Earle Gell M.D. On: 03/02/2018 13:01      CT scan was personally reviewed today.  This was compared to multiple previous scans.  I  did have a discussion with Dr. Kris Hartmann regarding this report.  She has had multiple previous CT scans but on a scan  dating back to 2017, the mass read is "new" in fact was present and is unchanged.  Overall, sided renal masses are unchanged in over 2 years and stable in size and number.  Assessment & Plan:    1. Renal mass Focal right renal lesions, all small without change over the past 2+ years As mentioned above, the CT scan was initially read as a new renal mass when in fact, it been present since 2017 and stable As such, I would highly recommend continued surveillance without surgical intervention Given that she had no interval change on E. Pridgen, will discuss with Dr. Janese Banks okay for continuation of this. We will follow-up in 6 months for reassurance, then decrease interval surveillance to a year Patient is agreeable to plan She is otherwise asymptomatic - CT Abd Wo & W Cm; Future   Return in about 6 months (around 12/10/2018) for CT abd .  Hollice Espy, MD  [facility] [address], [place], [place] [address] [phone]  I spent 25 min with this patient of which greater than 50% was spent in counseling and coordination of care with the patient.

## 2018-06-14 ENCOUNTER — Inpatient Hospital Stay: Payer: Medicare Other

## 2018-06-15 ENCOUNTER — Telehealth: Payer: Self-pay

## 2018-06-15 NOTE — Telephone Encounter (Signed)
I have faxed over notes from Dr Janese Banks and Dr Erlene Quan to the patient nephrologist office. I have sent a message to Dr Candiss Norse office. Notes have been received.

## 2018-06-15 NOTE — Telephone Encounter (Signed)
-----   Message from Sindy Guadeloupe, MD sent at 06/15/2018  8:15 AM EDT ----- Please get a printout of her note and message and fax it to her nephrologist. Her epo needs to be restarted by them   ----- Message ----- From: Hollice Espy, MD Sent: 06/14/2018   8:03 PM EDT To: Sindy Guadeloupe, MD  I personally reviewed these films with radiology.  In fact, her renal mass read as "new" has been present since 2017 and unchanged.  She is high risk for surgery.  As such, we will continue surveillance.  It is fine if she continues Epogen as her renal masses have been stable on this medication to date.  I will follow her slightly more closely and repeat imaging in 6 months, then increase interval again.  Hollice Espy

## 2018-06-16 ENCOUNTER — Inpatient Hospital Stay: Payer: Medicare Other

## 2018-06-16 DIAGNOSIS — Z992 Dependence on renal dialysis: Secondary | ICD-10-CM

## 2018-06-16 DIAGNOSIS — D649 Anemia, unspecified: Secondary | ICD-10-CM | POA: Diagnosis present

## 2018-06-16 DIAGNOSIS — D631 Anemia in chronic kidney disease: Secondary | ICD-10-CM

## 2018-06-16 DIAGNOSIS — E1143 Type 2 diabetes mellitus with diabetic autonomic (poly)neuropathy: Secondary | ICD-10-CM | POA: Diagnosis present

## 2018-06-16 DIAGNOSIS — N189 Chronic kidney disease, unspecified: Secondary | ICD-10-CM

## 2018-06-16 DIAGNOSIS — E11319 Type 2 diabetes mellitus with unspecified diabetic retinopathy without macular edema: Secondary | ICD-10-CM | POA: Diagnosis present

## 2018-06-16 DIAGNOSIS — I132 Hypertensive heart and chronic kidney disease with heart failure and with stage 5 chronic kidney disease, or end stage renal disease: Principal | ICD-10-CM | POA: Diagnosis present

## 2018-06-16 DIAGNOSIS — F329 Major depressive disorder, single episode, unspecified: Secondary | ICD-10-CM | POA: Diagnosis present

## 2018-06-16 DIAGNOSIS — N2581 Secondary hyperparathyroidism of renal origin: Secondary | ICD-10-CM | POA: Diagnosis present

## 2018-06-16 DIAGNOSIS — I2510 Atherosclerotic heart disease of native coronary artery without angina pectoris: Secondary | ICD-10-CM | POA: Diagnosis present

## 2018-06-16 DIAGNOSIS — K922 Gastrointestinal hemorrhage, unspecified: Secondary | ICD-10-CM | POA: Diagnosis present

## 2018-06-16 DIAGNOSIS — M62838 Other muscle spasm: Secondary | ICD-10-CM | POA: Diagnosis present

## 2018-06-16 DIAGNOSIS — N186 End stage renal disease: Secondary | ICD-10-CM | POA: Diagnosis present

## 2018-06-16 DIAGNOSIS — I5032 Chronic diastolic (congestive) heart failure: Secondary | ICD-10-CM | POA: Diagnosis present

## 2018-06-16 LAB — RETICULOCYTES
RBC.: 2.55 MIL/uL — ABNORMAL LOW (ref 3.80–5.20)
Retic Count, Absolute: 38.3 10*3/uL (ref 19.0–183.0)
Retic Ct Pct: 1.5 % (ref 0.4–3.1)

## 2018-06-16 LAB — IRON AND TIBC
Iron: 160 ug/dL (ref 28–170)
Saturation Ratios: 74 % — ABNORMAL HIGH (ref 10.4–31.8)
TIBC: 216 ug/dL — ABNORMAL LOW (ref 250–450)
UIBC: 56 ug/dL

## 2018-06-16 LAB — FOLATE: Folate: 25 ng/mL (ref >5.9)

## 2018-06-16 LAB — VITAMIN B12: Vitamin B-12: 2110 pg/mL — ABNORMAL HIGH (ref 180–914)

## 2018-06-16 LAB — FERRITIN: Ferritin: 979 ng/mL — ABNORMAL HIGH (ref 11–307)

## 2018-06-16 LAB — TSH: TSH: 1.77 u[IU]/mL (ref 0.350–4.500)

## 2018-06-17 ENCOUNTER — Other Ambulatory Visit: Payer: Self-pay

## 2018-06-17 ENCOUNTER — Encounter: Payer: Self-pay | Admitting: Emergency Medicine

## 2018-06-17 ENCOUNTER — Inpatient Hospital Stay
Admission: EM | Admit: 2018-06-17 | Discharge: 2018-06-20 | DRG: 291 | Disposition: A | Discharge status: Home / Self Care | Payer: Medicare Other | Attending: Internal Medicine | Admitting: Internal Medicine

## 2018-06-17 ENCOUNTER — Encounter: Payer: Self-pay | Admitting: *Deleted

## 2018-06-17 ENCOUNTER — Emergency Department: Payer: Medicare Other

## 2018-06-17 ENCOUNTER — Emergency Department
Admission: EM | Admit: 2018-06-17 | Discharge: 2018-06-17 | Disposition: A | Discharge status: Home / Self Care | Payer: Medicare Other | Source: Home / Self Care | Attending: Emergency Medicine | Admitting: Emergency Medicine

## 2018-06-17 DIAGNOSIS — E119 Type 2 diabetes mellitus without complications: Secondary | ICD-10-CM

## 2018-06-17 DIAGNOSIS — Z79899 Other long term (current) drug therapy: Secondary | ICD-10-CM | POA: Insufficient documentation

## 2018-06-17 DIAGNOSIS — N186 End stage renal disease: Secondary | ICD-10-CM

## 2018-06-17 DIAGNOSIS — J45909 Unspecified asthma, uncomplicated: Secondary | ICD-10-CM | POA: Insufficient documentation

## 2018-06-17 DIAGNOSIS — J449 Chronic obstructive pulmonary disease, unspecified: Secondary | ICD-10-CM | POA: Insufficient documentation

## 2018-06-17 DIAGNOSIS — R531 Weakness: Secondary | ICD-10-CM

## 2018-06-17 DIAGNOSIS — Z7982 Long term (current) use of aspirin: Secondary | ICD-10-CM

## 2018-06-17 DIAGNOSIS — Z992 Dependence on renal dialysis: Secondary | ICD-10-CM

## 2018-06-17 DIAGNOSIS — M62838 Other muscle spasm: Secondary | ICD-10-CM

## 2018-06-17 DIAGNOSIS — Z8673 Personal history of transient ischemic attack (TIA), and cerebral infarction without residual deficits: Secondary | ICD-10-CM

## 2018-06-17 DIAGNOSIS — I132 Hypertensive heart and chronic kidney disease with heart failure and with stage 5 chronic kidney disease, or end stage renal disease: Secondary | ICD-10-CM | POA: Diagnosis not present

## 2018-06-17 DIAGNOSIS — Z794 Long term (current) use of insulin: Secondary | ICD-10-CM | POA: Insufficient documentation

## 2018-06-17 DIAGNOSIS — D649 Anemia, unspecified: Secondary | ICD-10-CM | POA: Diagnosis present

## 2018-06-17 DIAGNOSIS — I120 Hypertensive chronic kidney disease with stage 5 chronic kidney disease or end stage renal disease: Secondary | ICD-10-CM

## 2018-06-17 DIAGNOSIS — Z87891 Personal history of nicotine dependence: Secondary | ICD-10-CM | POA: Insufficient documentation

## 2018-06-17 DIAGNOSIS — K922 Gastrointestinal hemorrhage, unspecified: Secondary | ICD-10-CM

## 2018-06-17 LAB — COMPREHENSIVE METABOLIC PANEL
ALT: 15 U/L (ref 0–44)
AST: 23 U/L (ref 15–41)
Albumin: 3.7 g/dL (ref 3.5–5.0)
Alkaline Phosphatase: 94 U/L (ref 38–126)
Anion gap: 11 (ref 5–15)
BUN: 17 mg/dL (ref 6–20)
CO2: 28 mmol/L (ref 22–32)
Calcium: 8.2 mg/dL — ABNORMAL LOW (ref 8.9–10.3)
Chloride: 96 mmol/L — ABNORMAL LOW (ref 98–111)
Creatinine, Ser: 2.83 mg/dL — ABNORMAL HIGH (ref 0.44–1.00)
GFR calc Af Amer: 20 mL/min — ABNORMAL LOW (ref >60)
GFR calc non Af Amer: 17 mL/min — ABNORMAL LOW (ref >60)
Glucose, Bld: 285 mg/dL — ABNORMAL HIGH (ref 70–99)
Potassium: 3.9 mmol/L (ref 3.5–5.1)
Sodium: 135 mmol/L (ref 135–145)
Total Bilirubin: 0.6 mg/dL (ref 0.3–1.2)
Total Protein: 7.4 g/dL (ref 6.5–8.1)

## 2018-06-17 LAB — CBC
HCT: 21.5 % — ABNORMAL LOW (ref 35.0–47.0)
Hemoglobin: 7.5 g/dL — ABNORMAL LOW (ref 12.0–16.0)
MCH: 31 pg (ref 26.0–34.0)
MCHC: 34.8 g/dL (ref 32.0–36.0)
MCV: 89.1 fL (ref 80.0–100.0)
Platelets: 135 10*3/uL — ABNORMAL LOW (ref 150–440)
RBC: 2.42 MIL/uL — ABNORMAL LOW (ref 3.80–5.20)
RDW: 15.2 % — ABNORMAL HIGH (ref 11.5–14.5)
WBC: 5.4 10*3/uL (ref 3.6–11.0)

## 2018-06-17 LAB — HAPTOGLOBIN: Haptoglobin: 139 mg/dL (ref 34–200)

## 2018-06-17 NOTE — ED Provider Notes (Addendum)
[facility] Emergency Department Provider Note   ____________________________________________   First MD Initiated Contact with Patient 06/17/18 2350     (approximate)  I have reviewed the triage vital signs and the nursing notes.   HISTORY  Chief Complaint Spasms    HPI [name] is a 57 y.o. female brought to the ED from home via EMS with a chief complaint of bilateral leg cramping.  Patient has a history of ESRD on HD who receives dialysis today.  In fact, she was seen in the ED afterwards for suspected low H/H.  She was found to have stable H/H and discharged home.  Returns because she started having bilateral anterior thigh cramps.  She ate as painful mustard prior to arrival.  EMS placed heat packs on her thighs which seemed to help somewhat.  Denies fever, chills, chest pain, shortness of breath, abdominal pain, nausea, vomiting.  Denies recent travel or trauma.   Past Medical History:  Diagnosis Date  . Anemia   . Anginal pain ([place])   . Anxiety   . Arthritis   . Asthma   . Broken wrist   . Bronchitis   . chronic diastolic CHF 0/16/0109  . COPD (chronic obstructive pulmonary disease) ([place])   . Coronary artery disease    a. cath 2013: stenting to RCA (report not available); b. cath 2014: LM nl, pLAD 40%, mLAD nl, ost LCx 40%, mid LCx nl, pRCA 30% @ site of prior stent, mRCA 50%  . Depression   . Diabetes mellitus without complication ([place])   . Diabetic neuropathy ([place])   . dialysis 2006  . Diverticulosis   . Dizziness   . Dyspnea   . Elevated lipids   . Environmental and seasonal allergies   . ESRD (end stage renal disease) on dialysis ([place])    M-W-F  . GERD (gastroesophageal reflux disease)   . Headache   . History of hiatal hernia   . HOH (hard of hearing)   . Hx of pancreatitis 2015  . Hypertension   . Lower extremity edema   . Mitral regurgitation    a. echo 10/2013: EF 62%, noWMA, mildly dilated LA, mild to mod MR/TR, GR1DD    . Myocardial infarction ([place])   . Orthopnea   . Pneumonia   . Renal cancer ([place])   . Renal insufficiency    Pt is on dialysis on M,W + F.  . Wheezing     Patient Active Problem List   Diagnosis Date Noted  . Symptomatic anemia 06/18/2018  . Gastroparesis due to DM ([place]) 01/18/2018  . Complication of vascular access for dialysis 12/04/2017  . Osteomyelitis ([place]) 09/30/2017  . Carotid stenosis 06/18/2017  . Shortness of breath 05/04/2017  . Cellulitis of lower extremity 07/29/2016  . Chronic venous insufficiency 07/29/2016  . Lymphedema 07/29/2016  . TIA (transient ischemic attack) 04/21/2016  . Altered mental status 04/08/2016  . Hyperammonemia ([place]) 04/08/2016  . Elevated troponin 04/08/2016  . Depression 04/08/2016  . Depression, major, recurrent, severe with psychosis ([place]) 04/08/2016  . Blood in stool   . Intractable cyclical vomiting with nausea   . Reflux esophagitis   . Gastritis   . Generalized abdominal pain   . Uncontrollable vomiting   . Major depressive disorder, recurrent episode, moderate ([place]) 03/15/2016  . Adjustment disorder with mixed anxiety and depressed mood 03/15/2016  . Malnutrition of moderate degree 12/01/2015  . Renal mass   . Dyspnea   . Acute renal  failure ([place])   . Respiratory failure ([place])   . High temperature 11/14/2015  . Pulmonary edema   . Encounter for central line placement   . Encounter for orogastric (OG) tube placement   . Nausea 11/12/2015  . Hyperkalemia 10/03/2015  . Diarrhea, unspecified 07/22/2015  . Pneumonia 05/21/2015  . Hypoglycemia 04/24/2015  . Unresponsiveness 04/24/2015  . Bradycardia 04/24/2015  . Hypothermia 04/24/2015  . Acute respiratory failure ([place]) 04/24/2015  . Acute diastolic CHF (congestive heart failure) ([place]) 04/05/2015  . Diabetic gastroparesis ([place]) 04/05/2015  . Hypokalemia 04/05/2015  . Generalized weakness 04/05/2015  . Acute pulmonary edema ([place]) 04/03/2015  . Nausea and vomiting 04/03/2015   . Hypoglycemia associated with diabetes ([place]) 04/03/2015  . Anemia of chronic disease 04/03/2015  . Secondary hyperparathyroidism ([place]) 04/03/2015  . Pressure ulcer 04/02/2015  . Acute respiratory failure with hypoxia ([place]) 04/01/2015  . Adjustment disorder with anxiety 03/14/2015  . Somatic symptom disorder, mild 03/08/2015  . Coronary artery disease involving native coronary artery of native heart without angina pectoris   . Nausea & vomiting 03/06/2015  . Abdominal pain 03/06/2015  . DM (diabetes mellitus) ([place]) 03/06/2015  . HTN (hypertension) 03/06/2015  . Gastroparesis 02/24/2015  . Pleural effusion 02/19/2015  . HCAP (healthcare-associated pneumonia) 02/19/2015  . End-stage renal disease on hemodialysis ([place]) 02/19/2015    Past Surgical History:  Procedure Laterality Date  . A/V SHUNTOGRAM Left 01/20/2018   Procedure: A/V SHUNTOGRAM;  Surgeon: Algernon Huxley, MD;  Location: [place] CV LAB;  Service: Cardiovascular;  Laterality: Left;  . ABDOMINAL HYSTERECTOMY  1992  . AMPUTATION TOE Left 10/02/2017   Procedure: AMPUTATION TOE-LEFT GREAT TOE;  Surgeon: Albertine Patricia, DPM;  Location: [facility] ORS;  Service: Podiatry;  Laterality: Left;  . APPENDECTOMY    . CARDIAC CATHETERIZATION Left 07/26/2015   Procedure: Left Heart Cath and Coronary Angiography;  Surgeon: Dionisio David, MD;  Location: [place] [facility];  Service: Cardiovascular;  Laterality: Left;  . CATARACT EXTRACTION W/ INTRAOCULAR LENS IMPLANT Right   . CATARACT EXTRACTION W/PHACO Left 03/10/2017   Procedure: CATARACT EXTRACTION PHACO AND INTRAOCULAR LENS PLACEMENT (IOC);  Surgeon: Birder Robson, MD;  Location: [facility] ORS;  Service: Ophthalmology;  Laterality: Left;  [place] 00:51.9 AP% 14.2 CDE 7.39 fluid pack lot # [id number]  . CHOLECYSTECTOMY    . COLONOSCOPY WITH PROPOFOL N/A 08/12/2016   Procedure: COLONOSCOPY WITH PROPOFOL;  Surgeon: Lollie Sails, MD;  Location: [facility] ENDOSCOPY;  Service: Endoscopy;   Laterality: N/A;  . DIALYSIS FISTULA CREATION Left    upper arm  . dialysis grafts    . ESOPHAGOGASTRODUODENOSCOPY N/A 03/08/2015   Procedure: ESOPHAGOGASTRODUODENOSCOPY (EGD);  Surgeon: Manya Silvas, MD;  Location: [facility] ENDOSCOPY;  Service: Endoscopy;  Laterality: N/A;  . ESOPHAGOGASTRODUODENOSCOPY (EGD) WITH PROPOFOL N/A 03/18/2016   Procedure: ESOPHAGOGASTRODUODENOSCOPY (EGD) WITH PROPOFOL;  Surgeon: [name], MD;  Location: [facility] ENDOSCOPY;  Service: Endoscopy;  Laterality: N/A;  . EYE SURGERY Right 2018  . FECAL TRANSPLANT N/A 08/23/2015   Procedure: FECAL TRANSPLANT;  Surgeon: Manya Silvas, MD;  Location: [facility] ENDOSCOPY;  Service: Endoscopy;  Laterality: N/A;  . HAND SURGERY Bilateral   . PERIPHERAL VASCULAR CATHETERIZATION N/A 12/20/2015   Procedure: Thrombectomy of dialysis access versus permcath placement;  Surgeon: Algernon Huxley, MD;  Location: [place] CV LAB;  Service: Cardiovascular;  Laterality: N/A;  . PERIPHERAL VASCULAR CATHETERIZATION N/A 12/20/2015   Procedure: A/V Shunt Intervention;  Surgeon: Algernon Huxley, MD;  Location: [place] CV LAB;  Service: Cardiovascular;  Laterality: N/A;  . PERIPHERAL VASCULAR CATHETERIZATION N/A 12/20/2015   Procedure: A/V Shuntogram/Fistulagram;  Surgeon: Algernon Huxley, MD;  Location: [place] CV LAB;  Service: Cardiovascular;  Laterality: N/A;  . PERIPHERAL VASCULAR CATHETERIZATION N/A 01/02/2016   Procedure: A/V Shuntogram/Fistulagram;  Surgeon: Algernon Huxley, MD;  Location: [place] CV LAB;  Service: Cardiovascular;  Laterality: N/A;  . PERIPHERAL VASCULAR CATHETERIZATION N/A 01/02/2016   Procedure: A/V Shunt Intervention;  Surgeon: Algernon Huxley, MD;  Location: [place] CV LAB;  Service: Cardiovascular;  Laterality: N/A;    Prior to Admission medications   Medication Sig Start Date End Date Taking? Authorizing Provider  albuterol (PROVENTIL HFA;VENTOLIN HFA) 108 (90 Base) MCG/ACT inhaler Inhale 2 puffs into the lungs  every 6 (six) hours as needed for wheezing or shortness of breath. 02/17/18  Yes Merlyn Lot, MD  albuterol (PROVENTIL) (2.5 MG/3ML) 0.083% nebulizer solution Take 2.5 mg by nebulization every 4 (four) hours as needed for wheezing or shortness of breath.   Yes [provider]  amLODipine (NORVASC) 10 MG tablet Take 10 mg by mouth daily.   Yes [provider]  aspirin EC 81 MG tablet Take 81 mg by mouth daily. 09/08/16  Yes [provider]  atorvastatin (LIPITOR) 20 MG tablet Take 20 mg by mouth every evening.  12/02/16  Yes [provider]  Biotin 1 MG CAPS Take 1 mg by mouth daily.   Yes [provider]  cetirizine (ZYRTEC) 10 MG tablet Take 10 mg by mouth daily.    Yes [provider]  cholecalciferol (VITAMIN D) 400 units TABS tablet Take 400 Units by mouth daily.   Yes [provider]  cyanocobalamin 1000 MCG tablet Take 1,000 mcg by mouth daily.    Yes [provider]  diclofenac sodium (VOLTAREN) 1 % GEL Apply 1 application topically daily as needed (for rash).   Yes [provider]  FLUoxetine (PROZAC) 20 MG capsule Take 1 capsule (20 mg total) by mouth daily. Patient taking differently: Take 20 mg by mouth at bedtime.  04/10/16  Yes Hower, Aaron Mose, MD  insulin aspart (NOVOLOG FLEXPEN) 100 UNIT/ML FlexPen Inject 4-15 Units into the skin 4 (four) times daily as needed for high blood sugar (> 100, per sliding scale).   Yes [provider]  lidocaine (LIDODERM) 5 % Place 1 patch onto the skin every 12 (twelve) hours. Remove & Discard patch within 12 hours or as directed by MD 02/20/18 02/20/19 Yes Nance Pear, MD  lipase/protease/amylase (CREON) 12000 UNITS CPEP capsule Take 36000 mg in the morning, 24000 midday & 36000 in the evening   Yes [provider]  multivitamin (RENA-VIT) TABS tablet Take 1 tablet by mouth daily.    Yes [provider]  nitroGLYCERIN (NITROSTAT) 0.4 MG SL  tablet Place 0.4 mg under the tongue every 5 (five) minutes as needed for chest pain.    Yes [provider]  ranitidine (ZANTAC) 300 MG capsule Take 1 capsule by mouth every evening. 01/19/18  Yes [provider]  rifaximin (XIFAXAN) 550 MG TABS tablet Take 550 mg by mouth 2 (two) times daily.   Yes [provider]  sevelamer carbonate (RENVELA) 800 MG tablet Take 800-2,400 mg by mouth See admin instructions. Take 2400 mg by mouth 3 times daily with meals and take 800 mg by mouth with snacks   Yes [provider]  HYDROcodone-acetaminophen (NORCO/VICODIN) 5-325 MG tablet Take 1 tablet by mouth every 6 (  six) hours as needed for moderate pain or severe pain. Patient not taking: Reported on 06/17/2018 05/05/17   Nicholes Mango, MD  hydrocortisone cream 0.5 % Apply topically 3 (three) times daily. Patient not taking: Reported on 06/17/2018 01/21/18   Loletha Grayer, MD    Allergies Ace inhibitors; Ativan [lorazepam]; Compazine [prochlorperazine edisylate]; Sumatriptan succinate; Dilaudid [hydromorphone hcl]; Ondansetron; Zofran [ondansetron hcl]; Codeine; Gabapentin; [place]; Losartan; Oxycodone; Prochlorperazine; Reglan [metoclopramide]; Scopolamine; Tape; and Tapentadol  Family History  Problem Relation Age of Onset  . Kidney disease Mother   . Diabetes Mother   . Cancer Father   . Kidney disease Sister     Social History Social History   Tobacco Use  . Smoking status: Former Smoker    Packs/day: 0.50    Years: 40.00    Pack years: 20.00    Types: Cigarettes    Last attempt to quit: 02/13/2015    Years since quitting: 3.3  . Smokeless tobacco: Never Used  Substance Use Topics  . Alcohol use: Not Currently    Comment: glass wine week per pt  . Drug use: Yes    Types: Marijuana    Comment: once a day    Review of Systems  Constitutional: No fever/chills Eyes: No visual changes. ENT: No sore throat. Cardiovascular: Denies chest  pain. Respiratory: Denies shortness of breath. Gastrointestinal: No abdominal pain.  No nausea, no vomiting.  No diarrhea.  No constipation. Genitourinary: Negative for dysuria. Musculoskeletal: Positive for muscle cramps.  Negative for back pain. Skin: Negative for rash. Neurological: Negative for headaches, focal weakness or numbness.   ____________________________________________   PHYSICAL EXAM:  VITAL SIGNS: ED Triage Vitals  Enc Vitals Group     BP 06/17/18 2347 (!) 110/34     Pulse Rate 06/17/18 2347 64     Resp 06/17/18 2347 20     Temp 06/17/18 2347 98.1 F (36.7 C)     Temp Source 06/17/18 2347 Oral     SpO2 06/17/18 2347 97 %     Weight 06/17/18 2343 174 lb (78.9 kg)     Height 06/17/18 2343 5\' 5"  (1.651 m)     Head Circumference --      Peak Flow --      Pain Score 06/17/18 2343 10     Pain Loc --      Pain Edu? --      Excl. in [place]? --     Constitutional: Alert and oriented. Well appearing and in mild acute distress. Eyes: Conjunctivae are normal. PERRL. EOMI. Head: Atraumatic. Nose: No congestion/rhinnorhea. Mouth/Throat: Mucous membranes are moist.  Oropharynx non-erythematous. Neck: No stridor.  No cervical spine tenderness to palpation. Cardiovascular: Normal rate, regular rhythm. Grossly normal heart sounds.  Good peripheral circulation. Respiratory: Normal respiratory effort.  No retractions. Lungs CTAB. Gastrointestinal: Soft and nontender. No distention. No abdominal bruits. No CVA tenderness. Musculoskeletal: Patient wincing every few minutes with each cycle of muscle spasms.  Spasms felt to bilateral anterior thighs.  Bilateral calves also tender to palpation.  1+ BLE nonpitting edema.  No joint effusions. Neurologic:  Normal speech and language. No gross focal neurologic deficits are appreciated.  Skin:  Skin is warm, dry and intact. No rash noted. Psychiatric: Mood and affect are normal. Speech and behavior are  normal.  ____________________________________________   LABS (all labs ordered are listed, but only abnormal results are displayed)  Labs Reviewed  CBC WITH DIFFERENTIAL/PLATELET - Abnormal; Notable for the following components:  Result Value   RBC 2.19 (*)    Hemoglobin 6.8 (*)    HCT 19.6 (*)    RDW 15.0 (*)    Platelets 127 (*)    All other components within normal limits  COMPREHENSIVE METABOLIC PANEL - Abnormal; Notable for the following components:   Potassium 3.3 (*)    BUN 22 (*)    Creatinine, Ser 4.12 (*)    Calcium 8.3 (*)    GFR calc non Af Amer 11 (*)    GFR calc Af Amer 13 (*)    All other components within normal limits  MAGNESIUM  CK  PHOSPHORUS  CALCIUM, IONIZED  HIV ANTIBODY (ROUTINE TESTING W REFLEX)  TYPE AND SCREEN  PREPARE RBC (CROSSMATCH)  TYPE AND SCREEN   ____________________________________________  EKG  ED ECG REPORT I, Leonila Speranza J, the attending physician, personally viewed and interpreted this ECG.   Date: 06/18/2018  EKG Time: 2354  Rate: 61  Rhythm: normal EKG, normal sinus rhythm  Axis: Normal  Intervals:none  ST&T Change: Nonspecific  ____________________________________________  RADIOLOGY  ED MD interpretation: Imaging studies from prior ED visit noted  Official radiology report(s): Dg Thoracic Spine 2 View  Result Date: 06/17/2018 CLINICAL DATA:  [place] backwards yesterday landing onto back with mid to low back pain. EXAM: THORACIC SPINE 2 VIEWS COMPARISON:  Chest x-ray 06/17/2018 and 02/20/2018 FINDINGS: Vertebral body alignment and heights are within normal. There is no acute compression fracture or subluxation. Pedicles are intact. Remainder of the exam is unchanged. IMPRESSION: No acute findings. Electronically Signed   By: Marin Olp M.D.   On: 06/17/2018 18:05   Dg Lumbar Spine Complete  Result Date: 06/17/2018 CLINICAL DATA:  Fall backwards yesterday landing onto back with low back pain. EXAM: LUMBAR SPINE -  COMPLETE 4+ VIEW COMPARISON:  KUB 05/05/2018 and CT 03/02/2018 FINDINGS: Vertebral body alignment, heights and disc space heights are normal. There is minimal spondylosis present. No evidence of compression fracture or subluxation. Calcified plaque over the abdominal aorta. IMPRESSION: No acute findings. Electronically Signed   By: Marin Olp M.D.   On: 06/17/2018 18:08   US Venous Img Lower Bilateral  Result Date: 06/18/2018 CLINICAL DATA:  57 year old female with bilateral leg swelling and cramping. EXAM: BILATERAL LOWER EXTREMITY VENOUS DOPPLER ULTRASOUND TECHNIQUE: Gray-scale sonography with graded compression, as well as color Doppler and duplex ultrasound were performed to evaluate the lower extremity deep venous systems from the level of the common femoral vein and including the common femoral, femoral, profunda femoral, popliteal and calf veins including the posterior tibial, peroneal and gastrocnemius veins when visible. The superficial great saphenous vein was also interrogated. Spectral Doppler was utilized to evaluate flow at rest and with distal augmentation maneuvers in the common femoral, femoral and popliteal veins. COMPARISON:  None. FINDINGS: RIGHT LOWER EXTREMITY Common Femoral Vein: No evidence of thrombus. Normal compressibility, respiratory phasicity and response to augmentation. Saphenofemoral Junction: No evidence of thrombus. Normal compressibility and flow on color Doppler imaging. Profunda Femoral Vein: No evidence of thrombus. Normal compressibility and flow on color Doppler imaging. Femoral Vein: No evidence of thrombus. Normal compressibility, respiratory phasicity and response to augmentation. Popliteal Vein: No evidence of thrombus. Normal compressibility, respiratory phasicity and response to augmentation. Calf Veins: No evidence of thrombus. Normal compressibility and flow on color Doppler imaging. Superficial Great Saphenous Vein: No evidence of thrombus. Normal  compressibility. Venous Reflux:  None. Other Findings:  None. LEFT LOWER EXTREMITY Common Femoral Vein: No evidence of thrombus. Normal compressibility,  respiratory phasicity and response to augmentation. Saphenofemoral Junction: No evidence of thrombus. Normal compressibility and flow on color Doppler imaging. Profunda Femoral Vein: No evidence of thrombus. Normal compressibility and flow on color Doppler imaging. Femoral Vein: No evidence of thrombus. Normal compressibility, respiratory phasicity and response to augmentation. Popliteal Vein: No evidence of thrombus. Normal compressibility, respiratory phasicity and response to augmentation. Calf Veins: No evidence of thrombus. Normal compressibility and flow on color Doppler imaging. Superficial Great Saphenous Vein: No evidence of thrombus. Normal compressibility. Venous Reflux:  None. Other Findings:  None. IMPRESSION: No evidence of deep venous thrombosis. Electronically Signed   By: Anner Crete M.D.   On: 06/18/2018 02:14   Dg Chest Portable 1 View  Result Date: 06/17/2018 CLINICAL DATA:  Rectal bleeding with abdominal bloating.  Dialysis. EXAM: PORTABLE CHEST 1 VIEW COMPARISON:  05/05/2018 FINDINGS: Lungs are adequately inflated without focal consolidation or effusion. Cardiomediastinal silhouette and remainder of the exam is unchanged. IMPRESSION: No active disease. Electronically Signed   By: Marin Olp M.D.   On: 06/17/2018 16:57    ____________________________________________   PROCEDURES  Procedure(s) performed: None  Procedures  Critical Care performed:   CRITICAL CARE Performed by: Paulette Blanch   Total critical care time: 30 minutes  Critical care time was exclusive of separately billable procedures and treating other patients.  Critical care was necessary to treat or prevent imminent or life-threatening deterioration.  Critical care was time spent personally by me on the following activities: development of treatment  plan with patient and/or surrogate as well as nursing, discussions with consultants, evaluation of patient's response to treatment, examination of patient, obtaining history from patient or surrogate, ordering and performing treatments and interventions, ordering and review of laboratory studies, ordering and review of radiographic studies, pulse oximetry and re-evaluation of patient's condition.  ____________________________________________   INITIAL IMPRESSION / ASSESSMENT AND PLAN / ED COURSE  As part of my medical decision making, I reviewed the following data within the [place] notes reviewed and incorporated, Labs reviewed, Radiograph reviewed and Notes from prior ED visits   57 year old female with renal failure on hemodialysis who presents with muscle cramps.  Differential diagnosis includes but is not limited to muscle cramps, rhabdomyolysis, electrolyte abnormalities, etc.  Will repeat lab work including CK and magnesium.  Obtain BLE Doppler ultrasounds to evaluate for DVT.  Patient will be given low-dose IV morphine for pain paired with low-dose oral Valium for muscle spasms.  Mustard packets provided to patient.  Clinical Course as of Jun 19 619  Fri Jun 18, 2018  0247 Awaiting lab results. Patient feeling better. Updated her on [place] results.   [JS]  2831 Noted lower H/H.  In light of patient's description of melena over the past 2 weeks, will discuss with hospitalist to evaluate patient in the emergency department for admission.  He does recommend transfusing 1 unit of PRBC.   [JS]    Clinical Course User Index [JS] Paulette Blanch, MD     ____________________________________________   FINAL CLINICAL IMPRESSION(S) / ED DIAGNOSES  Final diagnoses:  Muscle spasms of both lower extremities  Anemia, unspecified type  Gastrointestinal hemorrhage, unspecified gastrointestinal hemorrhage type  ESRD (end stage renal disease) on dialysis [facility])     ED  Discharge Orders    None       Note:  This document was prepared using Dragon voice recognition software and may include unintentional dictation errors.    Paulette Blanch, MD 06/18/18 [phone]  Paulette Blanch, MD 07/02/18 0500

## 2018-06-17 NOTE — ED Notes (Addendum)
Pt A&Ox3; dialysis performed at noon today and pt reports 4L fluid removed; was seen in ED this evening to eval labs and then d/c home; PTA pt had onset muscle cramping to legs and left arm; graft in place to left upper arm; resp even/unlab, lungs clear, apical audible & regular, +BS, abd soft/nondist/nontender, +PP, -edema; card monitor placed on pt for further monitoring

## 2018-06-17 NOTE — ED Notes (Signed)
Attempted to place 22g IV in right AC.  Was able to draw blood but not successful in advancing cath to place IV.

## 2018-06-17 NOTE — ED Triage Notes (Signed)
Pt to room 1 via EMS from home for c/o leg cramping

## 2018-06-17 NOTE — ED Notes (Signed)
Patient transported to X-ray 

## 2018-06-17 NOTE — Discharge Instructions (Addendum)
Please return if you feel worse.  Please have your doctor check on you in the next day or 2.  Please also return if you develop more black tarry stool or blood in the stool.  At the present time you have no blood in the stool and your blood count, that is your hemoglobin, is 7.5.

## 2018-06-17 NOTE — ED Provider Notes (Signed)
[facility] Emergency Department Provider Note   ____________________________________________   First MD Initiated Contact with Patient 06/17/18 1616     (approximate)  I have reviewed the triage vital signs and the nursing notes.   HISTORY  Chief Complaint GI Bleeding    HPI [name] is a 57 y.o. female patient complains of to feeling diffusely weak for about 1 week.  Dark stools for about 2 days.  She went to dialysis today and was noted to be anemic with hemoglobin in the sixes.  She denies any chest pain.   Past Medical History:  Diagnosis Date  . Anemia   . Anginal pain ([place])   . Anxiety   . Arthritis   . Asthma   . Broken wrist   . Bronchitis   . chronic diastolic CHF 7/89/3810  . COPD (chronic obstructive pulmonary disease) ([place])   . Coronary artery disease    a. cath 2013: stenting to RCA (report not available); b. cath 2014: LM nl, pLAD 40%, mLAD nl, ost LCx 40%, mid LCx nl, pRCA 30% @ site of prior stent, mRCA 50%  . Depression   . Diabetes mellitus without complication ([place])   . Diabetic neuropathy ([place])   . dialysis 2006  . Diverticulosis   . Dizziness   . Dyspnea   . Elevated lipids   . Environmental and seasonal allergies   . ESRD (end stage renal disease) on dialysis ([place])    M-W-F  . GERD (gastroesophageal reflux disease)   . Headache   . History of hiatal hernia   . HOH (hard of hearing)   . Hx of pancreatitis 2015  . Hypertension   . Lower extremity edema   . Mitral regurgitation    a. echo 10/2013: EF 62%, noWMA, mildly dilated LA, mild to mod MR/TR, GR1DD  . Myocardial infarction ([place])   . Orthopnea   . Pneumonia   . Renal cancer ([place])   . Renal insufficiency    Pt is on dialysis on M,W + F.  . Wheezing     Patient Active Problem List   Diagnosis Date Noted  . Gastroparesis due to DM ([place]) 01/18/2018  . Complication of vascular access for dialysis 12/04/2017  . Osteomyelitis ([place]) 09/30/2017  .  Carotid stenosis 06/18/2017  . Shortness of breath 05/04/2017  . Cellulitis of lower extremity 07/29/2016  . Chronic venous insufficiency 07/29/2016  . Lymphedema 07/29/2016  . TIA (transient ischemic attack) 04/21/2016  . Altered mental status 04/08/2016  . Hyperammonemia ([place]) 04/08/2016  . Elevated troponin 04/08/2016  . Depression 04/08/2016  . Depression, major, recurrent, severe with psychosis ([place]) 04/08/2016  . Blood in stool   . Intractable cyclical vomiting with nausea   . Reflux esophagitis   . Gastritis   . Generalized abdominal pain   . Uncontrollable vomiting   . Major depressive disorder, recurrent episode, moderate ([place]) 03/15/2016  . Adjustment disorder with mixed anxiety and depressed mood 03/15/2016  . Malnutrition of moderate degree 12/01/2015  . Renal mass   . Dyspnea   . Acute renal failure ([place])   . Respiratory failure ([place])   . High temperature 11/14/2015  . Pulmonary edema   . Encounter for central line placement   . Encounter for orogastric (OG) tube placement   . Nausea 11/12/2015  . Hyperkalemia 10/03/2015  . Diarrhea, unspecified 07/22/2015  . Pneumonia 05/21/2015  . Hypoglycemia 04/24/2015  . Unresponsiveness 04/24/2015  . Bradycardia 04/24/2015  . Hypothermia 04/24/2015  .  Acute respiratory failure ([place]) 04/24/2015  . Acute diastolic CHF (congestive heart failure) ([place]) 04/05/2015  . Diabetic gastroparesis ([place]) 04/05/2015  . Hypokalemia 04/05/2015  . Generalized weakness 04/05/2015  . Acute pulmonary edema ([place]) 04/03/2015  . Nausea and vomiting 04/03/2015  . Hypoglycemia associated with diabetes ([place]) 04/03/2015  . Anemia of chronic disease 04/03/2015  . Secondary hyperparathyroidism ([place]) 04/03/2015  . Pressure ulcer 04/02/2015  . Acute respiratory failure with hypoxia ([place]) 04/01/2015  . Adjustment disorder with anxiety 03/14/2015  . Somatic symptom disorder, mild 03/08/2015  . Coronary artery disease involving native coronary  artery of native heart without angina pectoris   . Nausea & vomiting 03/06/2015  . Abdominal pain 03/06/2015  . DM (diabetes mellitus) ([place]) 03/06/2015  . HTN (hypertension) 03/06/2015  . Gastroparesis 02/24/2015  . Pleural effusion 02/19/2015  . HCAP (healthcare-associated pneumonia) 02/19/2015  . End-stage renal disease on hemodialysis ([place]) 02/19/2015    Past Surgical History:  Procedure Laterality Date  . A/V SHUNTOGRAM Left 01/20/2018   Procedure: A/V SHUNTOGRAM;  Surgeon: Algernon Huxley, MD;  Location: [place] [facility];  Service: Cardiovascular;  Laterality: Left;  . ABDOMINAL HYSTERECTOMY  1992  . AMPUTATION TOE Left 10/02/2017   Procedure: AMPUTATION TOE-LEFT GREAT TOE;  Surgeon: Albertine Patricia, DPM;  Location: [facility] ORS;  Service: Podiatry;  Laterality: Left;  . APPENDECTOMY    . CARDIAC CATHETERIZATION Left 07/26/2015   Procedure: Left Heart Cath and Coronary Angiography;  Surgeon: Dionisio David, MD;  Location: [place] [facility];  Service: Cardiovascular;  Laterality: Left;  . CATARACT EXTRACTION W/ INTRAOCULAR LENS IMPLANT Right   . CATARACT EXTRACTION W/PHACO Left 03/10/2017   Procedure: CATARACT EXTRACTION PHACO AND INTRAOCULAR LENS PLACEMENT (IOC);  Surgeon: Birder Robson, MD;  Location: [facility] ORS;  Service: Ophthalmology;  Laterality: Left;  [place] 00:51.9 AP% 14.2 CDE 7.39 fluid pack lot # [id number]  . CHOLECYSTECTOMY    . COLONOSCOPY WITH PROPOFOL N/A 08/12/2016   Procedure: COLONOSCOPY WITH PROPOFOL;  Surgeon: Lollie Sails, MD;  Location: [facility] ENDOSCOPY;  Service: Endoscopy;  Laterality: N/A;  . DIALYSIS FISTULA CREATION Left    upper arm  . dialysis grafts    . ESOPHAGOGASTRODUODENOSCOPY N/A 03/08/2015   Procedure: ESOPHAGOGASTRODUODENOSCOPY (EGD);  Surgeon: Manya Silvas, MD;  Location: [facility] ENDOSCOPY;  Service: Endoscopy;  Laterality: N/A;  . ESOPHAGOGASTRODUODENOSCOPY (EGD) WITH PROPOFOL N/A 03/18/2016   Procedure: ESOPHAGOGASTRODUODENOSCOPY (EGD) WITH  PROPOFOL;  Surgeon: [name], MD;  Location: [facility] ENDOSCOPY;  Service: Endoscopy;  Laterality: N/A;  . EYE SURGERY Right 2018  . FECAL TRANSPLANT N/A 08/23/2015   Procedure: FECAL TRANSPLANT;  Surgeon: Manya Silvas, MD;  Location: [facility] ENDOSCOPY;  Service: Endoscopy;  Laterality: N/A;  . HAND SURGERY Bilateral   . PERIPHERAL VASCULAR CATHETERIZATION N/A 12/20/2015   Procedure: Thrombectomy of dialysis access versus permcath placement;  Surgeon: Algernon Huxley, MD;  Location: [place] [facility];  Service: Cardiovascular;  Laterality: N/A;  . PERIPHERAL VASCULAR CATHETERIZATION N/A 12/20/2015   Procedure: A/V Shunt Intervention;  Surgeon: Algernon Huxley, MD;  Location: [place] [facility];  Service: Cardiovascular;  Laterality: N/A;  . PERIPHERAL VASCULAR CATHETERIZATION N/A 12/20/2015   Procedure: A/V Shuntogram/Fistulagram;  Surgeon: Algernon Huxley, MD;  Location: [place] [facility];  Service: Cardiovascular;  Laterality: N/A;  . PERIPHERAL VASCULAR CATHETERIZATION N/A 01/02/2016   Procedure: A/V Shuntogram/Fistulagram;  Surgeon: Algernon Huxley, MD;  Location: [place] [facility] LAB;  Service: Cardiovascular;  Laterality: N/A;  . PERIPHERAL VASCULAR CATHETERIZATION  N/A 01/02/2016   Procedure: A/V Shunt Intervention;  Surgeon: Algernon Huxley, MD;  Location: [place] [facility];  Service: Cardiovascular;  Laterality: N/A;    Prior to Admission medications   Medication Sig Start Date End Date Taking? Authorizing Provider  albuterol (PROVENTIL HFA;VENTOLIN HFA) 108 (90 Base) MCG/ACT inhaler Inhale 2 puffs into the lungs every 6 (six) hours as needed for wheezing or shortness of breath. 02/17/18  Yes Merlyn Lot, MD  albuterol (PROVENTIL) (2.5 MG/3ML) 0.083% nebulizer solution Take 2.5 mg by nebulization every 4 (four) hours as needed for wheezing or shortness of breath.   Yes [provider]  amLODipine (NORVASC) 10 MG tablet Take 10 mg by mouth daily.   Yes [provider]  aspirin EC  81 MG tablet Take 81 mg by mouth daily. 09/08/16  Yes [provider]  atorvastatin (LIPITOR) 20 MG tablet Take 20 mg by mouth every evening.  12/02/16  Yes [provider]  Biotin 1 MG CAPS Take 1 mg by mouth daily.   Yes [provider]  cetirizine (ZYRTEC) 10 MG tablet Take 10 mg by mouth daily.    Yes [provider]  cholecalciferol (VITAMIN D) 400 units TABS tablet Take 400 Units by mouth daily.   Yes [provider]  cyanocobalamin 1000 MCG tablet Take 1,000 mcg by mouth daily.    Yes [provider]  diclofenac sodium (VOLTAREN) 1 % GEL Apply 1 application topically daily as needed (for rash).   Yes [provider]  FLUoxetine (PROZAC) 20 MG capsule Take 1 capsule (20 mg total) by mouth daily. Patient taking differently: Take 20 mg by mouth at bedtime.  04/10/16  Yes Hower, Aaron Mose, MD  insulin aspart (NOVOLOG FLEXPEN) 100 UNIT/ML FlexPen Inject 4-15 Units into the skin 4 (four) times daily as needed for high blood sugar (> 100, per sliding scale).   Yes [provider]  lidocaine (LIDODERM) 5 % Place 1 patch onto the skin every 12 (twelve) hours. Remove & Discard patch within 12 hours or as directed by MD 02/20/18 02/20/19 Yes Nance Pear, MD  multivitamin (RENA-VIT) TABS tablet Take 1 tablet by mouth daily.    Yes [provider]  nitroGLYCERIN (NITROSTAT) 0.4 MG SL tablet Place 0.4 mg under the tongue every 5 (five) minutes as needed for chest pain.    Yes [provider]  ranitidine (ZANTAC) 300 MG capsule Take 1 capsule by mouth every evening. 01/19/18  Yes [provider]  rifaximin (XIFAXAN) 550 MG TABS tablet Take 550 mg by mouth 2 (two) times daily.   Yes [provider]  sevelamer carbonate (RENVELA) 800 MG tablet Take 800-2,400 mg by mouth See admin instructions. Take 2400 mg by mouth 3 times daily with meals and take 800 mg by mouth with snacks   Yes [provider]    HYDROcodone-acetaminophen (NORCO/VICODIN) 5-325 MG tablet Take 1 tablet by mouth every 6 (six) hours as needed for moderate pain or severe pain. Patient not taking: Reported on 06/17/2018 05/05/17   Nicholes Mango, MD  hydrocortisone cream 0.5 % Apply topically 3 (three) times daily. Patient not taking: Reported on 06/17/2018 01/21/18   Loletha Grayer, MD  lipase/protease/amylase (CREON) 12000 UNITS CPEP capsule Take 36000 mg in the morning, 24000 midday & 36000 in the evening    [provider]    Allergies Ace inhibitors; Ativan [lorazepam]; Compazine [prochlorperazine edisylate]; Sumatriptan succinate; Dilaudid [hydromorphone hcl]; Ondansetron; Zofran [ondansetron hcl]; Codeine; Gabapentin; [place];  Losartan; Oxycodone; Prochlorperazine; Reglan [metoclopramide]; Scopolamine; Tape; and Tapentadol  Family History  Problem Relation Age of Onset  . Kidney disease Mother   . Diabetes Mother   . Cancer Father   . Kidney disease Sister     Social History Social History   Tobacco Use  . Smoking status: Former Smoker    Packs/day: 0.50    Years: 40.00    Pack years: 20.00    Types: Cigarettes    Last attempt to quit: 02/13/2015    Years since quitting: 3.3  . Smokeless tobacco: Never Used  Substance Use Topics  . Alcohol use: Not Currently    Comment: glass wine week per pt  . Drug use: Yes    Types: Marijuana    Comment: once a day    Review of Systems  Constitutional: No fever/chills Eyes: No visual changes. ENT: No sore throat. Cardiovascular: Denies chest pain. Respiratory: Denies shortness of breath. Gastrointestinal: No abdominal pain.  No nausea, no vomiting.  No diarrhea.  No constipation. Genitourinary: Negative for dysuria. Musculoskeletal: Negative for back pain. Skin: Negative for rash. Neurological: Negative for headaches, focal weakness     ____________________________________________   PHYSICAL EXAM:  VITAL SIGNS: ED Triage Vitals  Enc Vitals  Group     BP 06/17/18 1618 133/66     Pulse Rate 06/17/18 1618 66     Resp 06/17/18 1618 18     Temp 06/17/18 1618 98.3 F (36.8 C)     Temp Source 06/17/18 1618 Oral     SpO2 06/17/18 1610 95 %     Weight 06/17/18 1618 186 lb 8.2 oz (84.6 kg)     Height 06/17/18 1618 5\' 5"  (1.651 m)     Head Circumference --      Peak Flow --      Pain Score 06/17/18 1618 0     Pain Loc --      Pain Edu? --      Excl. in [place]? --     Constitutional: Alert and oriented. Well appearing and in no acute distress. Eyes: Conjunctivae are normal. PERRL. EOMI. Head: Atraumatic. Nose: No congestion/rhinnorhea. Mouth/Throat: Mucous membranes are moist.  Oropharynx non-erythematous. Neck: No stridor.  Cardiovascular: Normal rate, regular rhythm. Grossly normal heart sounds.  Good peripheral circulation. Respiratory: Normal respiratory effort.  No retractions. Lungs CTAB. Gastrointestinal: Soft and nontender. No distention. No abdominal bruits. No CVA tenderness. Musculoskeletal: No lower extremity tenderness nor edema.  Patient's back is tender from about T10 down to L3 or 4 Neurologic:  Normal speech and language. No gross focal neurologic deficits are appreciated.  Skin:  Skin is warm, dry and intact. No rash noted. Psychiatric: Mood and affect are normal. Speech and behavior are normal.  ____________________________________________   LABS (all labs ordered are listed, but only abnormal results are displayed)  Labs Reviewed  COMPREHENSIVE METABOLIC PANEL - Abnormal; Notable for the following components:      Result Value   Chloride 96 (*)    Glucose, Bld 285 (*)    Creatinine, Ser 2.83 (*)    Calcium 8.2 (*)    GFR calc non Af Amer 17 (*)    GFR calc Af Amer 20 (*)    All other components within normal limits  CBC - Abnormal; Notable for the following components:   RBC 2.42 (*)    Hemoglobin 7.5 (*)    HCT 21.5 (*)    RDW 15.2 (*)    Platelets 135 (*)  All other components within normal  limits  POC OCCULT BLOOD, ED  TYPE AND SCREEN   ____________________________________________  EKG   ____________________________________________  RADIOLOGY  ED MD interpretation:    Official radiology report(s): Dg Thoracic Spine 2 View  Result Date: 06/17/2018 CLINICAL DATA:  [place] backwards yesterday landing onto back with mid to low back pain. EXAM: THORACIC SPINE 2 VIEWS COMPARISON:  Chest x-ray 06/17/2018 and 02/20/2018 FINDINGS: Vertebral body alignment and heights are within normal. There is no acute compression fracture or subluxation. Pedicles are intact. Remainder of the exam is unchanged. IMPRESSION: No acute findings. Electronically Signed   By: Marin Olp M.D.   On: 06/17/2018 18:05   Dg Lumbar Spine Complete  Result Date: 06/17/2018 CLINICAL DATA:  Fall backwards yesterday landing onto back with low back pain. EXAM: LUMBAR SPINE - COMPLETE 4+ VIEW COMPARISON:  KUB 05/05/2018 and CT 03/02/2018 FINDINGS: Vertebral body alignment, heights and disc space heights are normal. There is minimal spondylosis present. No evidence of compression fracture or subluxation. Calcified plaque over the abdominal aorta. IMPRESSION: No acute findings. Electronically Signed   By: Marin Olp M.D.   On: 06/17/2018 18:08   Dg Chest Portable 1 View  Result Date: 06/17/2018 CLINICAL DATA:  Rectal bleeding with abdominal bloating.  Dialysis. EXAM: PORTABLE CHEST 1 VIEW COMPARISON:  05/05/2018 FINDINGS: Lungs are adequately inflated without focal consolidation or effusion. Cardiomediastinal silhouette and remainder of the exam is unchanged. IMPRESSION: No active disease. Electronically Signed   By: Marin Olp M.D.   On: 06/17/2018 16:57    ____________________________________________   PROCEDURES  Procedure(s) performed:   Procedures  Critical Care performed:   ____________________________________________   INITIAL IMPRESSION / ASSESSMENT AND PLAN / ED COURSE  Patient feels  weak.  She has been feeling weak even on her off dialysis days for some time.  She recently had iron studies B12 and folate which were okay.  She is not that anemic today.  Her rectal exam shows no blood in the stool.  I will let her go have her follow-up with her doctor.  She may benefit from iron pills but I will let her doctor decide about that.  She will return if she feels sicker or has more blood or black tarry stools.        ____________________________________________   FINAL CLINICAL IMPRESSION(S) / ED DIAGNOSES  Final diagnoses:  Weakness     ED Discharge Orders    None       Note:  This document was prepared using Dragon voice recognition software and may include unintentional dictation errors.    Nena Polio, MD 06/17/18 [phone]

## 2018-06-17 NOTE — ED Triage Notes (Signed)
EMS reports pt c/o rectal bleeding and abdominal bloating. Pt denies pain, c/o weakness. Pt describes dark tarry stools x 2 days. Pt went to dialysis today, was transported from there.

## 2018-06-18 ENCOUNTER — Emergency Department: Payer: Medicare Other

## 2018-06-18 ENCOUNTER — Encounter: Payer: Self-pay | Admitting: Internal Medicine

## 2018-06-18 DIAGNOSIS — Z992 Dependence on renal dialysis: Secondary | ICD-10-CM | POA: Diagnosis not present

## 2018-06-18 DIAGNOSIS — D649 Anemia, unspecified: Secondary | ICD-10-CM | POA: Diagnosis present

## 2018-06-18 DIAGNOSIS — M62838 Other muscle spasm: Secondary | ICD-10-CM | POA: Diagnosis present

## 2018-06-18 DIAGNOSIS — E11319 Type 2 diabetes mellitus with unspecified diabetic retinopathy without macular edema: Secondary | ICD-10-CM | POA: Diagnosis present

## 2018-06-18 DIAGNOSIS — K922 Gastrointestinal hemorrhage, unspecified: Secondary | ICD-10-CM | POA: Diagnosis present

## 2018-06-18 DIAGNOSIS — N2581 Secondary hyperparathyroidism of renal origin: Secondary | ICD-10-CM | POA: Diagnosis present

## 2018-06-18 DIAGNOSIS — I2510 Atherosclerotic heart disease of native coronary artery without angina pectoris: Secondary | ICD-10-CM | POA: Diagnosis present

## 2018-06-18 DIAGNOSIS — E1143 Type 2 diabetes mellitus with diabetic autonomic (poly)neuropathy: Secondary | ICD-10-CM | POA: Diagnosis present

## 2018-06-18 DIAGNOSIS — F329 Major depressive disorder, single episode, unspecified: Secondary | ICD-10-CM | POA: Diagnosis present

## 2018-06-18 DIAGNOSIS — I5032 Chronic diastolic (congestive) heart failure: Secondary | ICD-10-CM | POA: Diagnosis present

## 2018-06-18 DIAGNOSIS — I132 Hypertensive heart and chronic kidney disease with heart failure and with stage 5 chronic kidney disease, or end stage renal disease: Secondary | ICD-10-CM | POA: Diagnosis present

## 2018-06-18 DIAGNOSIS — N186 End stage renal disease: Secondary | ICD-10-CM | POA: Diagnosis present

## 2018-06-18 LAB — COMPREHENSIVE METABOLIC PANEL
ALT: 13 U/L (ref 0–44)
AST: 19 U/L (ref 15–41)
Albumin: 3.6 g/dL (ref 3.5–5.0)
Alkaline Phosphatase: 88 U/L (ref 38–126)
Anion gap: 11 (ref 5–15)
BUN: 22 mg/dL — ABNORMAL HIGH (ref 6–20)
CO2: 29 mmol/L (ref 22–32)
Calcium: 8.3 mg/dL — ABNORMAL LOW (ref 8.9–10.3)
Chloride: 98 mmol/L (ref 98–111)
Creatinine, Ser: 4.12 mg/dL — ABNORMAL HIGH (ref 0.44–1.00)
GFR calc Af Amer: 13 mL/min — ABNORMAL LOW (ref >60)
GFR calc non Af Amer: 11 mL/min — ABNORMAL LOW (ref >60)
Glucose, Bld: 86 mg/dL (ref 70–99)
Potassium: 3.3 mmol/L — ABNORMAL LOW (ref 3.5–5.1)
Sodium: 138 mmol/L (ref 135–145)
Total Bilirubin: 0.4 mg/dL (ref 0.3–1.2)
Total Protein: 6.9 g/dL (ref 6.5–8.1)

## 2018-06-18 LAB — CBC WITH DIFFERENTIAL/PLATELET
Basophils Absolute: 0.1 10*3/uL (ref 0–0.1)
Basophils Relative: 1 %
Eosinophils Absolute: 0.6 10*3/uL (ref 0–0.7)
Eosinophils Relative: 9 %
HCT: 19.6 % — ABNORMAL LOW (ref 35.0–47.0)
Hemoglobin: 6.8 g/dL — ABNORMAL LOW (ref 12.0–16.0)
Lymphocytes Relative: 34 %
Lymphs Abs: 2 10*3/uL (ref 1.0–3.6)
MCH: 30.8 pg (ref 26.0–34.0)
MCHC: 34.4 g/dL (ref 32.0–36.0)
MCV: 89.4 fL (ref 80.0–100.0)
Monocytes Absolute: 0.7 10*3/uL (ref 0.2–0.9)
Monocytes Relative: 11 %
Neutro Abs: 2.6 10*3/uL (ref 1.4–6.5)
Neutrophils Relative %: 45 %
Platelets: 127 10*3/uL — ABNORMAL LOW (ref 150–440)
RBC: 2.19 MIL/uL — ABNORMAL LOW (ref 3.80–5.20)
RDW: 15 % — ABNORMAL HIGH (ref 11.5–14.5)
WBC: 5.9 10*3/uL (ref 3.6–11.0)

## 2018-06-18 LAB — MAGNESIUM: Magnesium: 2.1 mg/dL (ref 1.7–2.4)

## 2018-06-18 LAB — CK: Total CK: 58 U/L (ref 38–234)

## 2018-06-18 LAB — HEMOGLOBIN AND HEMATOCRIT, BLOOD
HCT: 24 % — ABNORMAL LOW (ref 35.0–47.0)
Hemoglobin: 8.1 g/dL — ABNORMAL LOW (ref 12.0–16.0)

## 2018-06-18 LAB — GLUCOSE, CAPILLARY
Glucose-Capillary: 142 mg/dL — ABNORMAL HIGH (ref 70–99)
Glucose-Capillary: 114 mg/dL — ABNORMAL HIGH (ref 70–99)
Glucose-Capillary: 150 mg/dL — ABNORMAL HIGH (ref 70–99)

## 2018-06-18 LAB — PHOSPHORUS: Phosphorus: 3.3 mg/dL (ref 2.5–4.6)

## 2018-06-18 MED ORDER — ACETAMINOPHEN 325 MG PO TABS: 650.0000 mg | ORAL_TABLET | Freq: Four times a day (QID) | ORAL | Status: DC | PRN

## 2018-06-18 MED ORDER — ALBUTEROL SULFATE (2.5 MG/3ML) 0.083% IN NEBU: 2.5000 mg | INHALATION_SOLUTION | RESPIRATORY_TRACT | Status: DC | PRN

## 2018-06-18 MED ORDER — FLUOXETINE HCL 10 MG PO CAPS
20.0000 mg | ORAL_CAPSULE | Freq: Every day | ORAL | Status: DC
  Administered 2018-06-18 – 2018-06-19 (×2): 20 mg via ORAL
  Filled 2018-06-18 (×2): qty 2

## 2018-06-18 MED ORDER — SEVELAMER CARBONATE 800 MG PO TABS
800.0000 mg | ORAL_TABLET | ORAL | Status: DC
  Administered 2018-06-18 – 2018-06-19 (×2): 800 mg via ORAL
  Filled 2018-06-18 (×9): qty 1

## 2018-06-18 MED ORDER — PANCRELIPASE (LIP-PROT-AMYL) 12000-38000 UNITS PO CPEP
36000.0000 [IU] | ORAL_CAPSULE | Freq: Three times a day (TID) | ORAL | Status: DC
  Filled 2018-06-18 (×2): qty 3

## 2018-06-18 MED ORDER — POTASSIUM CHLORIDE CRYS ER 20 MEQ PO TBCR
40.0000 meq | EXTENDED_RELEASE_TABLET | Freq: Once | ORAL | Status: AC
  Administered 2018-06-18: 40 meq via ORAL
  Filled 2018-06-18: qty 2

## 2018-06-18 MED ORDER — INSULIN ASPART 100 UNIT/ML ~~LOC~~ SOLN
0.0000 [IU] | Freq: Three times a day (TID) | SUBCUTANEOUS | Status: DC
  Administered 2018-06-18: 2 [IU] via SUBCUTANEOUS
  Administered 2018-06-18 – 2018-06-19 (×2): 3 [IU] via SUBCUTANEOUS
  Administered 2018-06-19: 5 [IU] via SUBCUTANEOUS
  Administered 2018-06-20: 3 [IU] via SUBCUTANEOUS
  Filled 2018-06-18 (×5): qty 1

## 2018-06-18 MED ORDER — PANTOPRAZOLE SODIUM 40 MG IV SOLR
40.0000 mg | Freq: Two times a day (BID) | INTRAVENOUS | Status: DC
  Administered 2018-06-18 – 2018-06-19 (×2): 40 mg via INTRAVENOUS
  Filled 2018-06-18 (×5): qty 40

## 2018-06-18 MED ORDER — NITROGLYCERIN 0.4 MG SL SUBL
0.4000 mg | SUBLINGUAL_TABLET | SUBLINGUAL | Status: DC | PRN
  Filled 2018-06-18: qty 1

## 2018-06-18 MED ORDER — RIFAXIMIN 550 MG PO TABS
550.0000 mg | ORAL_TABLET | Freq: Two times a day (BID) | ORAL | Status: DC
  Administered 2018-06-18 – 2018-06-20 (×5): 550 mg via ORAL
  Filled 2018-06-18 (×7): qty 1

## 2018-06-18 MED ORDER — SEVELAMER CARBONATE 800 MG PO TABS
2400.0000 mg | ORAL_TABLET | Freq: Three times a day (TID) | ORAL | Status: DC
  Administered 2018-06-18 – 2018-06-20 (×7): 2400 mg via ORAL
  Filled 2018-06-18 (×10): qty 3

## 2018-06-18 MED ORDER — FAMOTIDINE 20 MG PO TABS
20.0000 mg | ORAL_TABLET | Freq: Two times a day (BID) | ORAL | Status: DC
  Administered 2018-06-18 – 2018-06-20 (×5): 20 mg via ORAL
  Filled 2018-06-18 (×5): qty 1

## 2018-06-18 MED ORDER — RENA-VITE PO TABS
1.0000 | ORAL_TABLET | Freq: Every day | ORAL | Status: DC
  Administered 2018-06-18 – 2018-06-20 (×3): 1 via ORAL
  Filled 2018-06-18 (×3): qty 1

## 2018-06-18 MED ORDER — INSULIN ASPART 100 UNIT/ML ~~LOC~~ SOLN
0.0000 [IU] | Freq: Every day | SUBCUTANEOUS | Status: DC
  Administered 2018-06-19: 2 [IU] via SUBCUTANEOUS
  Filled 2018-06-18: qty 1

## 2018-06-18 MED ORDER — VITAMIN B-12 1000 MCG PO TABS
1000.0000 ug | ORAL_TABLET | Freq: Every day | ORAL | Status: DC
  Administered 2018-06-18 – 2018-06-20 (×3): 1000 ug via ORAL
  Filled 2018-06-18 (×3): qty 1

## 2018-06-18 MED ORDER — PANCRELIPASE (LIP-PROT-AMYL) 12000-38000 UNITS PO CPEP
24000.0000 [IU] | ORAL_CAPSULE | Freq: Every day | ORAL | Status: DC
  Administered 2018-06-18 – 2018-06-19 (×2): 24000 [IU] via ORAL
  Filled 2018-06-18 (×3): qty 2

## 2018-06-18 MED ORDER — BISACODYL 5 MG PO TBEC
5.0000 mg | DELAYED_RELEASE_TABLET | Freq: Every day | ORAL | Status: DC | PRN
  Filled 2018-06-18: qty 1

## 2018-06-18 MED ORDER — DIAZEPAM 5 MG PO TABS
5.0000 mg | ORAL_TABLET | Freq: Three times a day (TID) | ORAL | Status: DC | PRN
  Filled 2018-06-18: qty 1

## 2018-06-18 MED ORDER — ATORVASTATIN CALCIUM 20 MG PO TABS
20.0000 mg | ORAL_TABLET | Freq: Every evening | ORAL | Status: DC
  Administered 2018-06-18 – 2018-06-19 (×2): 20 mg via ORAL
  Filled 2018-06-18 (×4): qty 1

## 2018-06-18 MED ORDER — SEVELAMER CARBONATE 0.8 G PO PACK
0.8000 g | PACK | ORAL | Status: DC
  Filled 2018-06-18: qty 1

## 2018-06-18 MED ORDER — AMLODIPINE BESYLATE 10 MG PO TABS
10.0000 mg | ORAL_TABLET | Freq: Every day | ORAL | Status: DC
  Administered 2018-06-18 – 2018-06-20 (×3): 10 mg via ORAL
  Filled 2018-06-18 (×2): qty 1
  Filled 2018-06-18: qty 2

## 2018-06-18 MED ORDER — SENNOSIDES-DOCUSATE SODIUM 8.6-50 MG PO TABS: 1.0000 | ORAL_TABLET | Freq: Every evening | ORAL | Status: DC | PRN

## 2018-06-18 MED ORDER — ACETAMINOPHEN 650 MG RE SUPP: 650.0000 mg | Freq: Four times a day (QID) | RECTAL | Status: DC | PRN

## 2018-06-18 MED ORDER — PANTOPRAZOLE SODIUM 40 MG IV SOLR
40.0000 mg | Freq: Two times a day (BID) | INTRAVENOUS | Status: DC
  Administered 2018-06-18: 40 mg via INTRAVENOUS
  Filled 2018-06-18 (×2): qty 40

## 2018-06-18 MED ORDER — SODIUM CHLORIDE 0.9 % IV SOLN
INTRAVENOUS | Status: DC | PRN
  Administered 2018-06-18: 08:00:00 via INTRAVENOUS

## 2018-06-18 MED ORDER — PANCRELIPASE (LIP-PROT-AMYL) 12000-38000 UNITS PO CPEP
36000.0000 [IU] | ORAL_CAPSULE | Freq: Two times a day (BID) | ORAL | Status: DC
  Administered 2018-06-18 – 2018-06-20 (×5): 36000 [IU] via ORAL
  Filled 2018-06-18 (×7): qty 3

## 2018-06-18 MED ORDER — DIAZEPAM 2 MG PO TABS
2.0000 mg | ORAL_TABLET | Freq: Once | ORAL | Status: AC
  Administered 2018-06-18: 2 mg via ORAL
  Filled 2018-06-18: qty 1

## 2018-06-18 MED ORDER — MORPHINE SULFATE (PF) 2 MG/ML IV SOLN
2.0000 mg | Freq: Once | INTRAVENOUS | Status: AC
  Administered 2018-06-18: 2 mg via INTRAVENOUS
  Filled 2018-06-18: qty 1

## 2018-06-18 MED ORDER — CHOLECALCIFEROL 10 MCG (400 UNIT) PO TABS
400.0000 [IU] | ORAL_TABLET | Freq: Every day | ORAL | Status: DC
  Administered 2018-06-18 – 2018-06-20 (×3): 400 [IU] via ORAL
  Filled 2018-06-18 (×3): qty 1

## 2018-06-18 MED ORDER — ASPIRIN EC 81 MG PO TBEC
81.0000 mg | DELAYED_RELEASE_TABLET | Freq: Every day | ORAL | Status: DC
  Administered 2018-06-18 – 2018-06-20 (×3): 81 mg via ORAL
  Filled 2018-06-18 (×3): qty 1

## 2018-06-18 NOTE — Progress Notes (Signed)
BG 114 at 1404

## 2018-06-18 NOTE — ED Notes (Signed)
Blood still infusing, pt eating meal tray. NAD.

## 2018-06-18 NOTE — ED Notes (Signed)
Warm blankets given, lights dimmed, breakfast tray removed from room, pt repositioned for comfort. Informed her we are waiting for room assignment on the unit.

## 2018-06-18 NOTE — ED Notes (Signed)
Pt voices good understanding of blood transfusion to be performed as explained by Dr Oren Bracket and consent signed by pt

## 2018-06-18 NOTE — ED Notes (Signed)
Pt tolerating blood transfusion without difficulty, pt resting quietly with eyes closed, respirations equal and unlabored.  VSS, waiting for breakfast tray.

## 2018-06-18 NOTE — ED Notes (Signed)
Pt's line flushing out blood at this time, almost complete, pt resting quietly with eyes closed. Respirations equal and unlabored.

## 2018-06-18 NOTE — ED Notes (Signed)
Called Bed Placement assigned bed 344  [id number]

## 2018-06-18 NOTE — ED Notes (Signed)
Report called to [facility] for room 344, informed her the patient had her meds about 3 hours post-renvela and informed her that the pt should wait an hour to take lunch time meds.

## 2018-06-18 NOTE — ED Notes (Signed)
Spoke with Dr. Dede Query, new order received for H/H post blood transfusion. Informed her the patient would be going to room 344 shortly.

## 2018-06-18 NOTE — Progress Notes (Signed)
57 year old female with end-stage renal disease, diabetes, hypertension, CAD, new right renal mass possible renal cell carcinoma admitted with weakness and noted to have a hemoglobin of 6.8 -s/p 1 unit prbc Tx today- hb now at 8.1 -Monitor hemoglobin in a.m.  Prior history of anemia.  Status post EGD and colonoscopy in the past.  Recent drop noted -With renal cell carcinoma, likely no Epogen though urologist recent notes states that it is okay to continue Epogen -GI consulted for further work-up -nephrology f/u for dialysis tomorrow - patient briefly seen- plan discussed

## 2018-06-18 NOTE — ED Notes (Addendum)
1 unit of blood completed, pt tolerated well. Waiting for bed to be assigned.  Blood documentation reviewed with gracie RN for completion.

## 2018-06-18 NOTE — ED Notes (Signed)
Pt to u/s via stretcher accomp by u/s tech 

## 2018-06-18 NOTE — H&P (Signed)
[facility] NAME: [name]    MR#:  [id number]  DATE OF BIRTH:  1961/06/25  DATE OF ADMISSION:  06/17/2018  PRIMARY CARE PHYSICIAN: Freddy Finner, NP   REQUESTING/REFERRING PHYSICIAN: Paulette Blanch, MD  CHIEF COMPLAINT:   Chief Complaint  Patient presents with  . Spasms    HISTORY OF PRESENT ILLNESS:  [name]  is a 57 y.o. female with a known history of ESRD (HD T/T/S via LUE AVG), GERD/hiatal hernia, T2IDDM (w/ neuropathy), HTN, HLD, CAD/MI, chronic diastolic CHF, chronic pancreatitis p/w symptomatic anemia, melena, suspected upper GI bleed. Pt states that she is currently receiving HD on a T/T/S schedule, but states she will be transition to a M/W/F schedule starting next week. Her last HD was on Thursday 06/17/2018. She states that HD ran from ~1215PM to ~1530-1600PM. She states she was told her Hgb was 6, and to go to the ED. She states she was checked in the ED, and it was 7.5. She was discharged home. She states that ~2hrs after going home, she was sitting on the edge of the bed getting ready to sleep, when she developed severe cramps in the B/L thighs and L forearm/wrist. She called EMS.  Endorses Hx GERD/hiatal hernia. States she used to see Dr. Vira Agar of Gastroenterology. She believes she last visited the office in 06/2017. She had an EGD in 02/2016, which demonstrated esophagitis (LA grade A) w/o bleeding @ GE junction, gastritis. She had a Colonoscopy in 07/2016, which demonstrated multiple polyps and diverticulosis. Both procedures were performed by Dr. Allen Norris. On present admission, she endorses a 2wk Hx of dark stools/melena. She denies AP. She denies hematochezia, hematuria, hematemesis or hemoptysis. She endorses fatigue/malaise/generalized weakness, but denies LH/LOC. She appears pale, but is comfortable, in no acute distress, and is otherwise w/o complaint.  PAST MEDICAL HISTORY:   Past Medical History:  Diagnosis Date   . Anemia   . Anginal pain ([place])   . Anxiety   . Arthritis   . Asthma   . Broken wrist   . Bronchitis   . chronic diastolic CHF 9/38/1017  . COPD (chronic obstructive pulmonary disease) ([place])   . Coronary artery disease    a. cath 2013: stenting to RCA (report not available); b. cath 2014: LM nl, pLAD 40%, mLAD nl, ost LCx 40%, mid LCx nl, pRCA 30% @ site of prior stent, mRCA 50%  . Depression   . Diabetes mellitus without complication ([place])   . Diabetic neuropathy ([place])   . dialysis 2006  . Diverticulosis   . Dizziness   . Dyspnea   . Elevated lipids   . Environmental and seasonal allergies   . ESRD (end stage renal disease) on dialysis ([place])    M-W-F  . GERD (gastroesophageal reflux disease)   . Headache   . History of hiatal hernia   . HOH (hard of hearing)   . Hx of pancreatitis 2015  . Hypertension   . Lower extremity edema   . Mitral regurgitation    a. echo 10/2013: EF 62%, noWMA, mildly dilated LA, mild to mod MR/TR, GR1DD  . Myocardial infarction ([place])   . Orthopnea   . Pneumonia   . Renal cancer ([place])   . Renal insufficiency    Pt is on dialysis on M,W + F.  . Wheezing     PAST SURGICAL HISTORY:   Past Surgical History:  Procedure Laterality Date  . A/V SHUNTOGRAM  Left 01/20/2018   Procedure: A/V SHUNTOGRAM;  Surgeon: Algernon Huxley, MD;  Location: [facility];  Service: Cardiovascular;  Laterality: Left;  . ABDOMINAL HYSTERECTOMY  1992  . AMPUTATION TOE Left 10/02/2017   Procedure: AMPUTATION TOE-LEFT GREAT TOE;  Surgeon: Albertine Patricia, DPM;  Location: [facility];  Service: Podiatry;  Laterality: Left;  . APPENDECTOMY    . CARDIAC CATHETERIZATION Left 07/26/2015   Procedure: Left Heart Cath and Coronary Angiography;  Surgeon: Dionisio David, MD;  Location: [facility];  Service: Cardiovascular;  Laterality: Left;  . CATARACT EXTRACTION W/ INTRAOCULAR LENS IMPLANT Right   . CATARACT EXTRACTION W/PHACO Left 03/10/2017   Procedure: CATARACT  EXTRACTION PHACO AND INTRAOCULAR LENS PLACEMENT (IOC);  Surgeon: Birder Robson, MD;  Location: [facility];  Service: Ophthalmology;  Laterality: Left;  [place] 00:51.9 AP% 14.2 CDE 7.39 fluid pack lot # [id number]  . CHOLECYSTECTOMY    . COLONOSCOPY WITH PROPOFOL N/A 08/12/2016   Procedure: COLONOSCOPY WITH PROPOFOL;  Surgeon: Lollie Sails, MD;  Location[facility];  Service: Endoscopy;  Laterality: N/A;  . DIALYSIS FISTULA CREATION Left    upper arm  . dialysis grafts    . ESOPHAGOGASTRODUODENOSCOPY N/A 03/08/2015   Procedure: ESOPHAGOGASTRODUODENOSCOPY (EGD);  Surgeon: Manya Silvas, MD;  Location: [facility] ENDOSCOPY;  Service: Endoscopy;  Laterality: N/A;  . ESOPHAGOGASTRODUODENOSCOPY (EGD) WITH PROPOFOL N/A 03/18/2016   Procedure: ESOPHAGOGASTRODUODENOSCOPY (EGD) WITH PROPOFOL;  Surgeon: [name], MD;  Location: [facility] ENDOSCOPY;  Service: Endoscopy;  Laterality: N/A;  . EYE SURGERY Right 2018  . FECAL TRANSPLANT N/A 08/23/2015   Procedure: FECAL TRANSPLANT;  Surgeon: Manya Silvas, MD;  Location: [facility] ENDOSCOPY;  Service: Endoscopy;  Laterality: N/A;  . HAND SURGERY Bilateral   . PERIPHERAL VASCULAR CATHETERIZATION N/A 12/20/2015   Procedure: Thrombectomy of dialysis access versus permcath placement;  Surgeon: Algernon Huxley, MD;  Location: [facility];  Service: Cardiovascular;  Laterality: N/A;  . PERIPHERAL VASCULAR CATHETERIZATION N/A 12/20/2015   Procedure: A/V Shunt Intervention;  Surgeon: Algernon Huxley, MD;  Location: [place] [facility];  Service: Cardiovascular;  Laterality: N/A;  . PERIPHERAL VASCULAR CATHETERIZATION N/A 12/20/2015   Procedure: A/V Shuntogram/Fistulagram;  Surgeon: Algernon Huxley, MD;  Location: [place] [facility];  Service: Cardiovascular;  Laterality: N/A;  . PERIPHERAL VASCULAR CATHETERIZATION N/A 01/02/2016   Procedure: A/V Shuntogram/Fistulagram;  Surgeon: Algernon Huxley, MD;  Location: [place] [facility];  Service: Cardiovascular;  Laterality: N/A;    . PERIPHERAL VASCULAR CATHETERIZATION N/A 01/02/2016   Procedure: A/V Shunt Intervention;  Surgeon: Algernon Huxley, MD;  Location: [place] [facility];  Service: Cardiovascular;  Laterality: N/A;    SOCIAL HISTORY:   Social History   Tobacco Use  . Smoking status: Former Smoker    Packs/day: 0.50    Years: 40.00    Pack years: 20.00    Types: Cigarettes    Last attempt to quit: 02/13/2015    Years since quitting: 3.3  . Smokeless tobacco: Never Used  Substance Use Topics  . Alcohol use: Not Currently    Comment: glass wine week per pt    FAMILY HISTORY:   Family History  Problem Relation Age of Onset  . Kidney disease Mother   . Diabetes Mother   . Cancer Father   . Kidney disease Sister     DRUG ALLERGIES:   Allergies  Allergen Reactions  . Ace Inhibitors Swelling and Anaphylaxis  . Ativan [Lorazepam] Other (See Comments)  Reaction:Hallucinations and headaches  . Compazine [Prochlorperazine Edisylate] Anaphylaxis, Nausea And Vomiting and Other (See Comments)    Other reaction(s): dystonia from this vs. Reglan, 23 Jul - patient relates that she takes promethazine frequently with no problems  . Sumatriptan Succinate Other (See Comments)    Other reaction(s): delirium and hallucinations per [facility] records  . Dilaudid [Hydromorphone Hcl] Other (See Comments)    Delirium   . Ondansetron Other (See Comments)    hallucinations    . Zofran [Ondansetron Hcl] Other (See Comments)    Reaction:  hallucinations   . Codeine Nausea And Vomiting  . Gabapentin Other (See Comments)    Reaction:  Unknown    . [place] Nausea And Vomiting  . Losartan Nausea Only  . Oxycodone Anxiety  . Prochlorperazine Other (See Comments)    Reaction:  Unknown . Patient does not remember reaction but she does have vertigo and anxiety along with n and v at times. Could be used to treat any of these   . Reglan [Metoclopramide] Other (See Comments)    Per patient her Dr. Evelina Bucy her off it   .  Scopolamine Other (See Comments)    Dizziness, also has vertigo already  . Tape Rash  . Tapentadol Rash    REVIEW OF SYSTEMS:   Review of Systems  Constitutional: Positive for malaise/fatigue. Negative for chills, diaphoresis, fever and weight loss.  HENT: Negative for congestion, ear pain, hearing loss, nosebleeds, sinus pain, sore throat and tinnitus.   Eyes: Negative for blurred vision, double vision and photophobia.  Respiratory: Negative for cough, hemoptysis, sputum production, shortness of breath and wheezing.   Cardiovascular: Negative for chest pain, palpitations, orthopnea, claudication, leg swelling and PND.  Gastrointestinal: Positive for melena. Negative for abdominal pain, blood in stool, constipation, diarrhea, heartburn, nausea and vomiting.  Genitourinary: Negative for dysuria, flank pain, frequency, hematuria and urgency.  Musculoskeletal: Positive for myalgias. Negative for back pain, falls, joint pain and neck pain.  Skin: Negative for itching and rash.  Neurological: Positive for weakness. Negative for dizziness, tingling, tremors, sensory change, speech change, focal weakness, seizures, loss of consciousness and headaches.  Psychiatric/Behavioral: Negative for memory loss. The patient does not have insomnia.    MEDICATIONS AT HOME:   Prior to Admission medications   Medication Sig Start Date End Date Taking? Authorizing Provider  albuterol (PROVENTIL HFA;VENTOLIN HFA) 108 (90 Base) MCG/ACT inhaler Inhale 2 puffs into the lungs every 6 (six) hours as needed for wheezing or shortness of breath. 02/17/18  Yes Merlyn Lot, MD  albuterol (PROVENTIL) (2.5 MG/3ML) 0.083% nebulizer solution Take 2.5 mg by nebulization every 4 (four) hours as needed for wheezing or shortness of breath.   Yes [provider]  amLODipine (NORVASC) 10 MG tablet Take 10 mg by mouth daily.   Yes [provider]  aspirin EC 81 MG tablet Take 81 mg by mouth daily. 09/08/16   Yes [provider]  atorvastatin (LIPITOR) 20 MG tablet Take 20 mg by mouth every evening.  12/02/16  Yes [provider]  Biotin 1 MG CAPS Take 1 mg by mouth daily.   Yes [provider]  cetirizine (ZYRTEC) 10 MG tablet Take 10 mg by mouth daily.    Yes [provider]  cholecalciferol (VITAMIN D) 400 units TABS tablet Take 400 Units by mouth daily.   Yes [provider]  cyanocobalamin 1000 MCG tablet Take 1,000 mcg by mouth daily.    Yes [provider]  diclofenac  sodium (VOLTAREN) 1 % GEL Apply 1 application topically daily as needed (for rash).   Yes [provider]  FLUoxetine (PROZAC) 20 MG capsule Take 1 capsule (20 mg total) by mouth daily. Patient taking differently: Take 20 mg by mouth at bedtime.  04/10/16  Yes Hower, Aaron Mose, MD  insulin aspart (NOVOLOG FLEXPEN) 100 UNIT/ML FlexPen Inject 4-15 Units into the skin 4 (four) times daily as needed for high blood sugar (> 100, per sliding scale).   Yes [provider]  lidocaine (LIDODERM) 5 % Place 1 patch onto the skin every 12 (twelve) hours. Remove & Discard patch within 12 hours or as directed by MD 02/20/18 02/20/19 Yes Nance Pear, MD  lipase/protease/amylase (CREON) 12000 UNITS CPEP capsule Take 36000 mg in the morning, 24000 midday & 36000 in the evening   Yes [provider]  multivitamin (RENA-VIT) TABS tablet Take 1 tablet by mouth daily.    Yes [provider]  nitroGLYCERIN (NITROSTAT) 0.4 MG SL tablet Place 0.4 mg under the tongue every 5 (five) minutes as needed for chest pain.    Yes [provider]  ranitidine (ZANTAC) 300 MG capsule Take 1 capsule by mouth every evening. 01/19/18  Yes [provider]  rifaximin (XIFAXAN) 550 MG TABS tablet Take 550 mg by mouth 2 (two) times daily.   Yes [provider]  sevelamer carbonate (RENVELA) 800 MG tablet Take 800-2,400 mg by mouth See admin instructions. Take 2400  mg by mouth 3 times daily with meals and take 800 mg by mouth with snacks   Yes [provider]  HYDROcodone-acetaminophen (NORCO/VICODIN) 5-325 MG tablet Take 1 tablet by mouth every 6 (six) hours as needed for moderate pain or severe pain. Patient not taking: Reported on 06/17/2018 05/05/17   Nicholes Mango, MD  hydrocortisone cream 0.5 % Apply topically 3 (three) times daily. Patient not taking: Reported on 06/17/2018 01/21/18   Loletha Grayer, MD      VITAL SIGNS:  Blood pressure (!) 127/52, pulse 67, temperature 97.8 F (36.6 C), temperature source Oral, resp. rate 14, height 5\' 5"  (1.651 m), weight 78.9 kg, SpO2 97 %.  PHYSICAL EXAMINATION:  Physical Exam  Constitutional: She is oriented to person, place, and time. She appears well-developed and well-nourished. She is active and cooperative.  Non-toxic appearance. She does not have a sickly appearance. She does not appear ill. No distress. She is not intubated.  HENT:  Head: Normocephalic and atraumatic.  Mouth/Throat: Oropharynx is clear and moist. No oropharyngeal exudate.  Eyes: Conjunctivae, EOM and lids are normal. No scleral icterus.  Neck: Neck supple. No JVD present. No thyromegaly present.  Cardiovascular: Normal rate, regular rhythm, S1 normal and S2 normal.  No extrasystoles are present. Exam reveals no gallop, no S3, no S4, no distant heart sounds and no friction rub.  Murmur heard.  Systolic murmur is present with a grade of 2/6. Pulmonary/Chest: Effort normal. No accessory muscle usage or stridor. No apnea, no tachypnea and no bradypnea. She is not intubated. No respiratory distress. She has no decreased breath sounds. She has no wheezes. She has no rhonchi. She has no rales.  Abdominal: Soft. She exhibits no distension. There is no tenderness. There is no rigidity, no rebound and no guarding.  Musculoskeletal: Normal range of motion. She exhibits no edema or tenderness.  Lymphadenopathy:    She has no cervical  adenopathy.  Neurological: She is alert and oriented to person, place, and time. She is not disoriented.  Skin: Skin is warm and dry. No rash noted. She is not diaphoretic. No erythema. There is pallor.  Psychiatric: She has a normal mood and affect. Her behavior is normal. Judgment and thought content normal.   (+) LUE AVG. LABORATORY PANEL:   CBC Recent Labs  Lab 06/18/18 0220  WBC 5.9  HGB 6.8*  HCT 19.6*  PLT 127*   ------------------------------------------------------------------------------------------------------------------  Chemistries  Recent Labs  Lab 06/18/18 0220  NA 138  K 3.3*  CL 98  CO2 29  GLUCOSE 86  BUN 22*  CREATININE 4.12*  CALCIUM 8.3*  MG 2.1  AST 19  ALT 13  ALKPHOS 88  BILITOT 0.4   ------------------------------------------------------------------------------------------------------------------  Cardiac Enzymes No results for input(s): TROPONINI in the last 168 hours. ------------------------------------------------------------------------------------------------------------------  RADIOLOGY:  Dg Thoracic Spine 2 View  Result Date: 06/17/2018 CLINICAL DATA:  [place] backwards yesterday landing onto back with mid to low back pain. EXAM: THORACIC SPINE 2 VIEWS COMPARISON:  Chest x-ray 06/17/2018 and 02/20/2018 FINDINGS: Vertebral body alignment and heights are within normal. There is no acute compression fracture or subluxation. Pedicles are intact. Remainder of the exam is unchanged. IMPRESSION: No acute findings. Electronically Signed   By: Marin Olp M.D.   On: 06/17/2018 18:05   Dg Lumbar Spine Complete  Result Date: 06/17/2018 CLINICAL DATA:  Fall backwards yesterday landing onto back with low back pain. EXAM: LUMBAR SPINE - COMPLETE 4+ VIEW COMPARISON:  KUB 05/05/2018 and CT 03/02/2018 FINDINGS: Vertebral body alignment, heights and disc space heights are normal. There is minimal spondylosis present. No evidence of compression fracture  or subluxation. Calcified plaque over the abdominal aorta. IMPRESSION: No acute findings. Electronically Signed   By: Marin Olp M.D.   On: 06/17/2018 18:08   US Venous Img Lower Bilateral  Result Date: 06/18/2018 CLINICAL DATA:  57 year old female with bilateral leg swelling and cramping. EXAM: BILATERAL LOWER EXTREMITY VENOUS DOPPLER ULTRASOUND TECHNIQUE: Gray-scale sonography with graded compression, as well as color Doppler and duplex ultrasound were performed to evaluate the lower extremity deep venous systems from the level of the common femoral vein and including the common femoral, femoral, profunda femoral, popliteal and calf veins including the posterior tibial, peroneal and gastrocnemius veins when visible. The superficial great saphenous vein was also interrogated. Spectral Doppler was utilized to evaluate flow at rest and with distal augmentation maneuvers in the common femoral, femoral and popliteal veins. COMPARISON:  None. FINDINGS: RIGHT LOWER EXTREMITY Common Femoral Vein: No evidence of thrombus. Normal compressibility, respiratory phasicity and response to augmentation. Saphenofemoral Junction: No evidence of thrombus. Normal compressibility and flow on color Doppler imaging. Profunda Femoral Vein: No evidence of thrombus. Normal compressibility and flow on color Doppler imaging. Femoral Vein: No evidence of thrombus. Normal compressibility, respiratory phasicity and response to augmentation. Popliteal Vein: No evidence of thrombus. Normal compressibility, respiratory phasicity and response to augmentation. Calf Veins: No evidence of thrombus. Normal compressibility and flow on color Doppler imaging. Superficial Great Saphenous Vein: No evidence of thrombus. Normal compressibility. Venous Reflux:  None. Other Findings:  None. LEFT LOWER EXTREMITY Common Femoral Vein: No evidence of thrombus. Normal compressibility, respiratory phasicity and response to augmentation. Saphenofemoral  Junction: No evidence of thrombus. Normal compressibility and flow on color Doppler imaging. Profunda Femoral Vein: No evidence of thrombus. Normal compressibility and flow on color Doppler imaging. Femoral Vein: No evidence of thrombus. Normal compressibility, respiratory phasicity and response to augmentation. Popliteal Vein: No evidence of thrombus. Normal compressibility, respiratory phasicity and response to augmentation.  Calf Veins: No evidence of thrombus. Normal compressibility and flow on color Doppler imaging. Superficial Great Saphenous Vein: No evidence of thrombus. Normal compressibility. Venous Reflux:  None. Other Findings:  None. IMPRESSION: No evidence of deep venous thrombosis. Electronically Signed   By: Anner Crete M.D.   On: 06/18/2018 02:14   Dg Chest Portable 1 View  Result Date: 06/17/2018 CLINICAL DATA:  Rectal bleeding with abdominal bloating.  Dialysis. EXAM: PORTABLE CHEST 1 VIEW COMPARISON:  05/05/2018 FINDINGS: Lungs are adequately inflated without focal consolidation or effusion. Cardiomediastinal silhouette and remainder of the exam is unchanged. IMPRESSION: No active disease. Electronically Signed   By: Marin Olp M.D.   On: 06/17/2018 16:57   IMPRESSION AND PLAN:   A/P: 69F w/ PMHx ESRD (HD T/T/S via LUE AVG), GERD/gastritis/esophagitis/hiatal hernia, chronic pancreatitis p/w symptomatic (normocytic) anemia, melena, suspected upper GI bleed. Hypokalemia, ESRD, hypocalcemia. -Symptomatic anemia, melena, suspected upper GI bleed: Pt p/w Hx GERD/gastritis/esophagitis/hiatal hernia, chronic pancreatitis p/w normocytic anemia, Hgb 6.8. Appears pale. Endorses fatigue/malaise/generalized weakness. Endorses dark stools/melena x2wks. 02/2016 EGD (+) esophagitis (LA grade A) w/o bleeding @ GE junction, gastritis. Suspect UGIB w/ chronic gastrointestinal blood loss anemia on present admission. 1u pRBC txfn ordered in ED. Monitor Hgb. Protonix 40mg  IV BID. GI  consult. -Hypokalemia: Replete K+, monitor BMP. Mag WNL. -ESRD: Had HD on Thursday 09/26. Nephrology consult for HD if pt to stay until Saturday 06/19/2018. c/w Renvela. -Hypocalcemia: Ionized calcium. -c/w other home meds. -FEN/GI: Renal diabetic free-water restricted diet. -DVT PPx: SCDs, no pharmacological DVT PPx (GI bleed, anemia). -Code status: Full code. -Disposition: Admission, > 2 midnights.   All the records are reviewed and case discussed with ED provider. Management plans discussed with the patient, family and they are in agreement.  CODE STATUS: Full code.  TOTAL TIME TAKING CARE OF THIS PATIENT: 75 minutes.    Arta Silence M.D on 06/18/2018 at 8:30 AM  Between 7am to 6pm - Pager - [phone]  After 6pm go to [email or web address] - Proofreader  [facility]  Office  [phone]  CC: Primary care physician; Freddy Finner, NP   Note: This dictation was prepared with Dragon dictation along with smaller phrase technology. Any transcriptional errors that result from this process are unintentional.

## 2018-06-18 NOTE — Progress Notes (Signed)
GI consult called and discussed with Dr. Vicente Males. Nephrology consult called and discussed with Dr. Candiss Norse.

## 2018-06-19 LAB — BASIC METABOLIC PANEL
Anion gap: 11 (ref 5–15)
BUN: 45 mg/dL — ABNORMAL HIGH (ref 6–20)
CO2: 27 mmol/L (ref 22–32)
Calcium: 8.3 mg/dL — ABNORMAL LOW (ref 8.9–10.3)
Chloride: 95 mmol/L — ABNORMAL LOW (ref 98–111)
Creatinine, Ser: 6.34 mg/dL — ABNORMAL HIGH (ref 0.44–1.00)
GFR calc Af Amer: 8 mL/min — ABNORMAL LOW (ref >60)
GFR calc non Af Amer: 6 mL/min — ABNORMAL LOW (ref >60)
Glucose, Bld: 183 mg/dL — ABNORMAL HIGH (ref 70–99)
Potassium: 5.1 mmol/L (ref 3.5–5.1)
Sodium: 133 mmol/L — ABNORMAL LOW (ref 135–145)

## 2018-06-19 LAB — CBC
HCT: 23 % — ABNORMAL LOW (ref 35.0–47.0)
Hemoglobin: 7.9 g/dL — ABNORMAL LOW (ref 12.0–16.0)
MCH: 31 pg (ref 26.0–34.0)
MCHC: 34.5 g/dL (ref 32.0–36.0)
MCV: 89.9 fL (ref 80.0–100.0)
Platelets: 132 10*3/uL — ABNORMAL LOW (ref 150–440)
RBC: 2.56 MIL/uL — ABNORMAL LOW (ref 3.80–5.20)
RDW: 15.3 % — ABNORMAL HIGH (ref 11.5–14.5)
WBC: 7.7 10*3/uL (ref 3.6–11.0)

## 2018-06-19 LAB — GLUCOSE, CAPILLARY
Glucose-Capillary: 181 mg/dL — ABNORMAL HIGH (ref 70–99)
Glucose-Capillary: 116 mg/dL — ABNORMAL HIGH (ref 70–99)
Glucose-Capillary: 227 mg/dL — ABNORMAL HIGH (ref 70–99)
Glucose-Capillary: 216 mg/dL — ABNORMAL HIGH (ref 70–99)

## 2018-06-19 LAB — PHOSPHORUS: Phosphorus: 2.8 mg/dL (ref 2.5–4.6)

## 2018-06-19 LAB — HIV ANTIBODY (ROUTINE TESTING W REFLEX): HIV Screen 4th Generation wRfx: NONREACTIVE

## 2018-06-19 LAB — CALCIUM, IONIZED: Calcium, Ionized, Serum: 4.3 mg/dL — ABNORMAL LOW (ref 4.5–5.6)

## 2018-06-19 MED ORDER — ALTEPLASE 2 MG IJ SOLR: 2.0000 mg | Freq: Once | INTRAMUSCULAR | Status: DC | PRN

## 2018-06-19 MED ORDER — HYDROCORTISONE 1 % EX CREA
TOPICAL_CREAM | CUTANEOUS | Status: DC | PRN
  Filled 2018-06-19: qty 28

## 2018-06-19 MED ORDER — SODIUM CHLORIDE 0.9 % IV SOLN: 100.0000 mL | INTRAVENOUS | Status: DC | PRN

## 2018-06-19 MED ORDER — EPOETIN ALFA 10000 UNIT/ML IJ SOLN: 4000.0000 [IU] | INTRAMUSCULAR | Status: DC

## 2018-06-19 MED ORDER — HEPARIN SODIUM (PORCINE) 1000 UNIT/ML DIALYSIS: 1000.0000 [IU] | INTRAMUSCULAR | Status: DC | PRN

## 2018-06-19 MED ORDER — LIDOCAINE HCL (PF) 1 % IJ SOLN
5.0000 mL | INTRAMUSCULAR | Status: DC | PRN
  Filled 2018-06-19: qty 5

## 2018-06-19 MED ORDER — CHLORHEXIDINE GLUCONATE CLOTH 2 % EX PADS: 6.0000 | MEDICATED_PAD | Freq: Every day | CUTANEOUS | Status: DC

## 2018-06-19 MED ORDER — HEPARIN SODIUM (PORCINE) 1000 UNIT/ML DIALYSIS: 20.0000 [IU]/kg | INTRAMUSCULAR | Status: DC | PRN

## 2018-06-19 MED ORDER — LIDOCAINE-PRILOCAINE 2.5-2.5 % EX CREA
1.0000 "application " | TOPICAL_CREAM | CUTANEOUS | Status: DC | PRN
  Filled 2018-06-19: qty 5

## 2018-06-19 MED ORDER — EPOETIN ALFA 10000 UNIT/ML IJ SOLN
4000.0000 [IU] | INTRAMUSCULAR | Status: DC
  Administered 2018-06-19: 4000 [IU] via INTRAVENOUS

## 2018-06-19 MED ORDER — PENTAFLUOROPROP-TETRAFLUOROETH EX AERO
1.0000 "application " | INHALATION_SPRAY | CUTANEOUS | Status: DC | PRN
  Filled 2018-06-19: qty 30

## 2018-06-19 NOTE — Progress Notes (Signed)
[name] at [place] NAME: [name]    MR#:  [id number]  DATE OF BIRTH:  April 18, 1961  SUBJECTIVE:  Patient seen while in hemodialysis.  No acute events overnight.  REVIEW OF SYSTEMS:    Review of Systems  Constitutional: Negative for fever, chills weight loss HENT: Negative for ear pain, nosebleeds, congestion, facial swelling, rhinorrhea, neck pain, neck stiffness and ear discharge.   Respiratory: Negative for cough, shortness of breath, wheezing  Cardiovascular: Negative for chest pain, palpitations and leg swelling.  Gastrointestinal: Negative for heartburn, abdominal pain, vomiting, diarrhea or consitpation Genitourinary: Negative for dysuria, urgency, frequency, hematuria Musculoskeletal: Negative for back pain or joint pain Neurological: Negative for dizziness, seizures, syncope, focal weakness,  numbness and headaches.  Hematological: Does not bruise/bleed easily.  Psychiatric/Behavioral: Negative for hallucinations, confusion, dysphoric mood    Tolerating Diet: yes      DRUG ALLERGIES:   Allergies  Allergen Reactions  . Ace Inhibitors Swelling and Anaphylaxis  . Ativan [Lorazepam] Other (See Comments)    Reaction:Hallucinations and headaches  . Compazine [Prochlorperazine Edisylate] Anaphylaxis, Nausea And Vomiting and Other (See Comments)    Other reaction(s): dystonia from this vs. Reglan, 23 Jul - patient relates that she takes promethazine frequently with no problems  . Sumatriptan Succinate Other (See Comments)    Other reaction(s): delirium and hallucinations per [facility] records  . Dilaudid [Hydromorphone Hcl] Other (See Comments)    Delirium   . Ondansetron Other (See Comments)    hallucinations    . Zofran [Ondansetron Hcl] Other (See Comments)    Reaction:  hallucinations   . Codeine Nausea And Vomiting  . Gabapentin Other (See Comments)    Reaction:  Unknown    . [place] Nausea And Vomiting  . Losartan  Nausea Only  . Oxycodone Anxiety  . Prochlorperazine Other (See Comments)    Reaction:  Unknown . Patient does not remember reaction but she does have vertigo and anxiety along with n and v at times. Could be used to treat any of these   . Reglan [Metoclopramide] Other (See Comments)    Per patient her Dr. Evelina Bucy her off it   . Scopolamine Other (See Comments)    Dizziness, also has vertigo already  . Tape Rash  . Tapentadol Rash    VITALS:  Blood pressure (!) 172/62, pulse 67, temperature 98.3 F (36.8 C), temperature source Oral, resp. rate 17, height 5\' 5"  (1.651 m), weight 88.8 kg, SpO2 100 %.  PHYSICAL EXAMINATION:  Constitutional: Appears well-developed and well-nourished. No distress. HENT: Normocephalic. Marland Kitchen Oropharynx is clear and moist.  Eyes: Conjunctivae and EOM are normal. PERRLA, no scleral icterus.  Neck: Normal ROM. Neck supple. No JVD. No tracheal deviation. CVS: RRR, S1/S2 +, no murmurs, no gallops, no carotid bruit.  Pulmonary: Effort and breath sounds normal, no stridor, rhonchi, wheezes, rales.  Abdominal: Soft. BS +,  no distension, tenderness, rebound or guarding.  Musculoskeletal: Normal range of motion. No edema and no tenderness.  Neuro: Alert. CN 2-12 grossly intact. No focal deficits. Skin: Skin is warm and dry. No rash noted. Psychiatric: Normal mood and affect.      LABORATORY PANEL:   CBC Recent Labs  Lab 06/19/18 1025  WBC 7.7  HGB 7.9*  HCT 23.0*  PLT 132*   ------------------------------------------------------------------------------------------------------------------  Chemistries  Recent Labs  Lab 06/18/18 0220 06/19/18 1025  NA 138 133*  K 3.3* 5.1  CL 98 95*  CO2 29 27  GLUCOSE 86 183*  BUN 22* 45*  CREATININE 4.12* 6.34*  CALCIUM 8.3* 8.3*  MG 2.1  --   AST 19  --   ALT 13  --   ALKPHOS 88  --   BILITOT 0.4  --     ------------------------------------------------------------------------------------------------------------------  Cardiac Enzymes No results for input(s): TROPONINI in the last 168 hours. ------------------------------------------------------------------------------------------------------------------  RADIOLOGY:  Dg Thoracic Spine 2 View  Result Date: 06/17/2018 CLINICAL DATA:  [place] backwards yesterday landing onto back with mid to low back pain. EXAM: THORACIC SPINE 2 VIEWS COMPARISON:  Chest x-ray 06/17/2018 and 02/20/2018 FINDINGS: Vertebral body alignment and heights are within normal. There is no acute compression fracture or subluxation. Pedicles are intact. Remainder of the exam is unchanged. IMPRESSION: No acute findings. Electronically Signed   By: Marin Olp M.D.   On: 06/17/2018 18:05   Dg Lumbar Spine Complete  Result Date: 06/17/2018 CLINICAL DATA:  Fall backwards yesterday landing onto back with low back pain. EXAM: LUMBAR SPINE - COMPLETE 4+ VIEW COMPARISON:  KUB 05/05/2018 and CT 03/02/2018 FINDINGS: Vertebral body alignment, heights and disc space heights are normal. There is minimal spondylosis present. No evidence of compression fracture or subluxation. Calcified plaque over the abdominal aorta. IMPRESSION: No acute findings. Electronically Signed   By: Marin Olp M.D.   On: 06/17/2018 18:08   US Venous Img Lower Bilateral  Result Date: 06/18/2018 CLINICAL DATA:  57 year old female with bilateral leg swelling and cramping. EXAM: BILATERAL LOWER EXTREMITY VENOUS DOPPLER ULTRASOUND TECHNIQUE: Gray-scale sonography with graded compression, as well as color Doppler and duplex ultrasound were performed to evaluate the lower extremity deep venous systems from the level of the common femoral vein and including the common femoral, femoral, profunda femoral, popliteal and calf veins including the posterior tibial, peroneal and gastrocnemius veins when visible. The superficial  great saphenous vein was also interrogated. Spectral Doppler was utilized to evaluate flow at rest and with distal augmentation maneuvers in the common femoral, femoral and popliteal veins. COMPARISON:  None. FINDINGS: RIGHT LOWER EXTREMITY Common Femoral Vein: No evidence of thrombus. Normal compressibility, respiratory phasicity and response to augmentation. Saphenofemoral Junction: No evidence of thrombus. Normal compressibility and flow on color Doppler imaging. Profunda Femoral Vein: No evidence of thrombus. Normal compressibility and flow on color Doppler imaging. Femoral Vein: No evidence of thrombus. Normal compressibility, respiratory phasicity and response to augmentation. Popliteal Vein: No evidence of thrombus. Normal compressibility, respiratory phasicity and response to augmentation. Calf Veins: No evidence of thrombus. Normal compressibility and flow on color Doppler imaging. Superficial Great Saphenous Vein: No evidence of thrombus. Normal compressibility. Venous Reflux:  None. Other Findings:  None. LEFT LOWER EXTREMITY Common Femoral Vein: No evidence of thrombus. Normal compressibility, respiratory phasicity and response to augmentation. Saphenofemoral Junction: No evidence of thrombus. Normal compressibility and flow on color Doppler imaging. Profunda Femoral Vein: No evidence of thrombus. Normal compressibility and flow on color Doppler imaging. Femoral Vein: No evidence of thrombus. Normal compressibility, respiratory phasicity and response to augmentation. Popliteal Vein: No evidence of thrombus. Normal compressibility, respiratory phasicity and response to augmentation. Calf Veins: No evidence of thrombus. Normal compressibility and flow on color Doppler imaging. Superficial Great Saphenous Vein: No evidence of thrombus. Normal compressibility. Venous Reflux:  None. Other Findings:  None. IMPRESSION: No evidence of deep venous thrombosis. Electronically Signed   By: Anner Crete M.D.    On: 06/18/2018 02:14   Dg Chest Portable 1 View  Result Date: 06/17/2018 CLINICAL DATA:  Rectal bleeding  with abdominal bloating.  Dialysis. EXAM: PORTABLE CHEST 1 VIEW COMPARISON:  05/05/2018 FINDINGS: Lungs are adequately inflated without focal consolidation or effusion. Cardiomediastinal silhouette and remainder of the exam is unchanged. IMPRESSION: No active disease. Electronically Signed   By: Marin Olp M.D.   On: 06/17/2018 16:57     ASSESSMENT AND PLAN:   57 year old female with end-stage renal disease on hemodialysis, diabetes and CAD who presented to the emergency room due to weakness and noted to have low hemoglobin.   1.  Acute on chronic anemia status post 1 unit PRBC GI consultation pending  2.  End-stage renal disease on hemodialysis: Continue dialysis as per nephrology.  3.  Renal cell carcinoma: Patient will follow-up with urology  4.  Hypokalemia this was repleted  5.  Diabetes: Continue ADA diet with sliding scale  6.  CAD: Continue aspirin and atorvastatin  7.  Essential hypertension: Continue Norvasc  8.  Depression: Continue Prozac      Management plans discussed with the patient and she is in agreement.  CODE STATUS: full  TOTAL TIME TAKING CARE OF THIS PATIENT: 30 minutes.     POSSIBLE D/C 1-2 days, DEPENDING ON CLINICAL CONDITION.   Nelline Lio M.D on 06/19/2018 at 12:08 PM  Between 7am to 6pm - Pager - [phone] After 6pm go to [email or web address] - password [email or web address] [facility]  Office  [phone]  CC: Primary care physician; Freddy Finner, NP  Note: This dictation was prepared with Dragon dictation along with smaller phrase technology. Any transcriptional errors that result from this process are unintentional.

## 2018-06-19 NOTE — Progress Notes (Signed)
HD tx start    06/19/18 1003  Vital Signs  Pulse Rate 65  Pulse Rate Source Monitor  Resp 13  BP (!) 147/57  BP Location Right Arm  BP Method Automatic  Patient Position (if appropriate) Lying  Oxygen Therapy  SpO2 100 %  O2 Device Room Air  During Hemodialysis Assessment  Blood Flow Rate (mL/min) 400 mL/min  Arterial Pressure (mmHg) -130 mmHg  Venous Pressure (mmHg) 170 mmHg  Transmembrane Pressure (mmHg) 60 mmHg  Ultrafiltration Rate (mL/min) 430 mL/min  Dialysate Flow Rate (mL/min) 500 ml/min  Conductivity: Machine  14  HD Safety Checks Performed Yes  Dialysis Fluid Bolus Normal Saline  Bolus Amount (mL) 250 mL  Intra-Hemodialysis Comments Tx initiated  Fistula / Graft Right Upper arm Arteriovenous fistula  No Placement Date or Time found.   Placed prior to admission: Yes  Orientation: Right  Access Location: Upper arm  Access Type: Arteriovenous fistula  Status Accessed  Needle Size 15

## 2018-06-19 NOTE — Progress Notes (Signed)
This note also relates to the following rows which could not be included: Pulse Rate - Cannot attach notes to unvalidated device data Resp - Cannot attach notes to unvalidated device data BP - Cannot attach notes to unvalidated device data SpO2 - Cannot attach notes to unvalidated device data  Hd completed  

## 2018-06-19 NOTE — Consult Note (Signed)
[facility] GI Inpatient Consult Note   [name], M.D.  Reason for Consult: acute on chronic anemia, suspect GI bleeding   Attending Requesting Consult: Bettey Costa, M.D.   History of Present Illness: [name] is a 57 y.o. female with a history of end-stage renal disease on hemodialysis recently found to have enlarging masses on the right kidney.  For this reason the patient has had erythropoietin withheld on several hemodialysis sessions.  It appears the patient's hemoglobin has drifted down as a result down to around 6.8 requiring 1 unit blood transfusion.  Patient reportedly mentioned something about "dark stools" in the emergency room, however, tells me today that she is never seen any blood or black stool. Patient underwent EGD in March 18, 2016 by Dr. Allen Norris revealing reactive gastropathy on biopsies of apparent gastritis.  Patient also had grade a esophagitis.  Colonoscopy performed 08/12/2016 by Dr. Loistine Simas revealed multiple adenomatous and hyperplastic polyps as well as diverticulosis of the colon.  Biopsies were negative for microscopic colitis.  Past Medical History:  Past Medical History:  Diagnosis Date  . Anemia   . Anginal pain ([place])   . Anxiety   . Arthritis   . Asthma   . Broken wrist   . Bronchitis   . chronic diastolic CHF 4/81/8563  . COPD (chronic obstructive pulmonary disease) ([place])   . Coronary artery disease    a. cath 2013: stenting to RCA (report not available); b. cath 2014: LM nl, pLAD 40%, mLAD nl, ost LCx 40%, mid LCx nl, pRCA 30% @ site of prior stent, mRCA 50%  . Depression   . Diabetes mellitus without complication ([place])   . Diabetic neuropathy ([place])   . dialysis 2006  . Diverticulosis   . Dizziness   . Dyspnea   . Elevated lipids   . Environmental and seasonal allergies   . ESRD (end stage renal disease) on dialysis ([place])    M-W-F  . GERD (gastroesophageal reflux disease)   . Headache   . History of hiatal hernia   .  HOH (hard of hearing)   . Hx of pancreatitis 2015  . Hypertension   . Lower extremity edema   . Mitral regurgitation    a. echo 10/2013: EF 62%, noWMA, mildly dilated LA, mild to mod MR/TR, GR1DD  . Myocardial infarction ([place])   . Orthopnea   . Pneumonia   . Renal cancer ([place])   . Renal insufficiency    Pt is on dialysis on M,W + F.  . Wheezing     Problem List: Patient Active Problem List   Diagnosis Date Noted  . Symptomatic anemia 06/18/2018  . Gastroparesis due to DM ([place]) 01/18/2018  . Complication of vascular access for dialysis 12/04/2017  . Osteomyelitis ([place]) 09/30/2017  . Carotid stenosis 06/18/2017  . Shortness of breath 05/04/2017  . Cellulitis of lower extremity 07/29/2016  . Chronic venous insufficiency 07/29/2016  . Lymphedema 07/29/2016  . TIA (transient ischemic attack) 04/21/2016  . Altered mental status 04/08/2016  . Hyperammonemia ([place]) 04/08/2016  . Elevated troponin 04/08/2016  . Depression 04/08/2016  . Depression, major, recurrent, severe with psychosis ([place]) 04/08/2016  . Blood in stool   . Intractable cyclical vomiting with nausea   . Reflux esophagitis   . Gastritis   . Generalized abdominal pain   . Uncontrollable vomiting   . Major depressive disorder, recurrent episode, moderate ([place]) 03/15/2016  . Adjustment disorder with mixed anxiety and depressed mood 03/15/2016  .  Malnutrition of moderate degree 12/01/2015  . Renal mass   . Dyspnea   . Acute renal failure ([place])   . Respiratory failure ([place])   . High temperature 11/14/2015  . Pulmonary edema   . Encounter for central line placement   . Encounter for orogastric (OG) tube placement   . Nausea 11/12/2015  . Hyperkalemia 10/03/2015  . Diarrhea, unspecified 07/22/2015  . Pneumonia 05/21/2015  . Hypoglycemia 04/24/2015  . Unresponsiveness 04/24/2015  . Bradycardia 04/24/2015  . Hypothermia 04/24/2015  . Acute respiratory failure ([place]) 04/24/2015  . Acute diastolic CHF (congestive  heart failure) ([place]) 04/05/2015  . Diabetic gastroparesis ([place]) 04/05/2015  . Hypokalemia 04/05/2015  . Generalized weakness 04/05/2015  . Acute pulmonary edema ([place]) 04/03/2015  . Nausea and vomiting 04/03/2015  . Hypoglycemia associated with diabetes ([place]) 04/03/2015  . Anemia of chronic disease 04/03/2015  . Secondary hyperparathyroidism ([place]) 04/03/2015  . Pressure ulcer 04/02/2015  . Acute respiratory failure with hypoxia ([place]) 04/01/2015  . Adjustment disorder with anxiety 03/14/2015  . Somatic symptom disorder, mild 03/08/2015  . Coronary artery disease involving native coronary artery of native heart without angina pectoris   . Nausea & vomiting 03/06/2015  . Abdominal pain 03/06/2015  . DM (diabetes mellitus) ([place]) 03/06/2015  . HTN (hypertension) 03/06/2015  . Gastroparesis 02/24/2015  . Pleural effusion 02/19/2015  . HCAP (healthcare-associated pneumonia) 02/19/2015  . End-stage renal disease on hemodialysis ([place]) 02/19/2015    Past Surgical History: Past Surgical History:  Procedure Laterality Date  . A/V SHUNTOGRAM Left 01/20/2018   Procedure: A/V SHUNTOGRAM;  Surgeon: Algernon Huxley, MD;  Location: [facility];  Service: Cardiovascular;  Laterality: Left;  . ABDOMINAL HYSTERECTOMY  1992  . AMPUTATION TOE Left 10/02/2017   Procedure: AMPUTATION TOE-LEFT GREAT TOE;  Surgeon: Albertine Patricia, DPM;  Location: [facility];  Service: Podiatry;  Laterality: Left;  . APPENDECTOMY    . CARDIAC CATHETERIZATION Left 07/26/2015   Procedure: Left Heart Cath and Coronary Angiography;  Surgeon: Dionisio David, MD;  Location: [facility];  Service: Cardiovascular;  Laterality: Left;  . CATARACT EXTRACTION W/ INTRAOCULAR LENS IMPLANT Right   . CATARACT EXTRACTION W/PHACO Left 03/10/2017   Procedure: CATARACT EXTRACTION PHACO AND INTRAOCULAR LENS PLACEMENT (IOC);  Surgeon: Birder Robson, MD;  Location: [facility]S;  Service: Ophthalmology;  Laterality: Left;  [place] 00:51.9 AP%  14.2 CDE 7.39 fluid pack lot # [id number]  . CHOLECYSTECTOMY    . COLONOSCOPY WITH PROPOFOL N/A 08/12/2016   Procedure: COLONOSCOPY WITH PROPOFOL;  Surgeon: Lollie Sails, MD;  Location[facility] END[facility]COPY;  Service: Endoscopy;  Laterality: N/A;  . DIALYSIS FISTULA CREATION Left    upper arm  . dialysis grafts    . ESOPHAGOGASTRODUODENOSCOPY N/A 03/08/2015   Procedure: ESOPHAGOGASTRODUODENOSCOPY (EGD);  Surgeon: Manya Silvas, MD;  Location: [facility] ENDOSCOPY;  Service: Endoscopy;  Laterality: N/A;  . ESOPHAGOGASTRODUODENOSCOPY (EGD) WITH PROPOFOL N/A 03/18/2016   Procedure: ESOPHAGOGASTRODUODENOSCOPY (EGD) WITH PROPOFOL;  Surgeon: [name], MD;  Location: [facility] ENDOSCOPY;  Service: Endoscopy;  Laterality: N/A;  . EYE SURGERY Right 2018  . FECAL TRANSPLANT N/A 08/23/2015   Procedure: FECAL TRANSPLANT;  Surgeon: Manya Silvas, MD;  Location[facility] ENDOSCOPY;  Service: Endoscopy;  Laterality: N/A;  . HAND SURGERY Bilateral   . PERIPHERAL VASCULAR CATHETERIZATION N/A 12/20/2015   Procedure: Thrombectomy of dialysis access versus permcath placement;  Surgeon: Algernon Huxley, MD;  Location: [facility];  Service: Cardiovascular;  Laterality: N/A;  . PERIPHERAL VASCULAR  CATHETERIZATION N/A 12/20/2015   Procedure: A/V Shunt Intervention;  Surgeon: Algernon Huxley, MD;  Location: [facility];  Service: Cardiovascular;  Laterality: N/A;  . PERIPHERAL VASCULAR CATHETERIZATION N/A 12/20/2015   Procedure: A/V Shuntogram/Fistulagram;  Surgeon: Algernon Huxley, MD;  Location: [facility];  Service: Cardiovascular;  Laterality: N/A;  . PERIPHERAL VASCULAR CATHETERIZATION N/A 01/02/2016   Procedure: A/V Shuntogram/Fistulagram;  Surgeon: Algernon Huxley, MD;  Location: [facility];  Service: Cardiovascular;  Laterality: N/A;  . PERIPHERAL VASCULAR CATHETERIZATION N/A 01/02/2016   Procedure: A/V Shunt Intervention;  Surgeon: Algernon Huxley, MD;  Location: [facility];  Service:  Cardiovascular;  Laterality: N/A;    Allergies: Allergies  Allergen Reactions  . Ace Inhibitors Swelling and Anaphylaxis  . Ativan [Lorazepam] Other (See Comments)    Reaction:Hallucinations and headaches  . Compazine [Prochlorperazine Edisylate] Anaphylaxis, Nausea And Vomiting and Other (See Comments)    Other reaction(s): dystonia from this vs. Reglan, 23 Jul - patient relates that she takes promethazine frequently with no problems  . Sumatriptan Succinate Other (See Comments)    Other reaction(s): delirium and hallucinations per [facility] records  . Dilaudid [Hydromorphone Hcl] Other (See Comments)    Delirium   . Ondansetron Other (See Comments)    hallucinations    . Zofran [Ondansetron Hcl] Other (See Comments)    Reaction:  hallucinations   . Codeine Nausea And Vomiting  . Gabapentin Other (See Comments)    Reaction:  Unknown    . [place] Nausea And Vomiting  . Losartan Nausea Only  . Oxycodone Anxiety  . Prochlorperazine Other (See Comments)    Reaction:  Unknown . Patient does not remember reaction but she does have vertigo and anxiety along with n and v at times. Could be used to treat any of these   . Reglan [Metoclopramide] Other (See Comments)    Per patient her Dr. Evelina Bucy her off it   . Scopolamine Other (See Comments)    Dizziness, also has vertigo already  . Tape Rash  . Tapentadol Rash    Home Medications: Medications Prior to Admission  Medication Sig Dispense Refill Last Dose  . albuterol (PROVENTIL HFA;VENTOLIN HFA) 108 (90 Base) MCG/ACT inhaler Inhale 2 puffs into the lungs every 6 (six) hours as needed for wheezing or shortness of breath. 1 Inhaler 2 prn at prn  . albuterol (PROVENTIL) (2.5 MG/3ML) 0.083% nebulizer solution Take 2.5 mg by nebulization every 4 (four) hours as needed for wheezing or shortness of breath.   prn at prn  . amLODipine (NORVASC) 10 MG tablet Take 10 mg by mouth daily.   06/17/2018 at Unknown time  . aspirin EC 81 MG tablet Take  81 mg by mouth daily.   06/17/2018 at Unknown time  . atorvastatin (LIPITOR) 20 MG tablet Take 20 mg by mouth every evening.    06/17/2018 at Unknown time  . Biotin 1 MG CAPS Take 1 mg by mouth daily.   06/17/2018 at Unknown time  . cetirizine (ZYRTEC) 10 MG tablet Take 10 mg by mouth daily.    06/17/2018 at Unknown time  . cholecalciferol (VITAMIN D) 400 units TABS tablet Take 400 Units by mouth daily.   06/17/2018 at Unknown time  . cyanocobalamin 1000 MCG tablet Take 1,000 mcg by mouth daily.    06/17/2018 at Unknown time  . diclofenac sodium (VOLTAREN) 1 % GEL Apply 1 application topically daily as needed (for rash).   06/17/2018 at  Unknown time  . FLUoxetine (PROZAC) 20 MG capsule Take 1 capsule (20 mg total) by mouth daily. (Patient taking differently: Take 20 mg by mouth at bedtime. ) 30 capsule 0 06/17/2018 at Unknown time  . insulin aspart (NOVOLOG FLEXPEN) 100 UNIT/ML FlexPen Inject 4-15 Units into the skin 4 (four) times daily as needed for high blood sugar (> 100, per sliding scale).   06/17/2018 at Unknown time  . lidocaine (LIDODERM) 5 % Place 1 patch onto the skin every 12 (twelve) hours. Remove & Discard patch within 12 hours or as directed by MD 10 patch 0 06/17/2018 at Unknown time  . lipase/protease/amylase (CREON) 12000 UNITS CPEP capsule Take 36000 mg in the morning, 24000 midday & 36000 in the evening   06/17/2018 at Unknown time  . multivitamin (RENA-VIT) TABS tablet Take 1 tablet by mouth daily.    06/17/2018 at Unknown time  . nitroGLYCERIN (NITROSTAT) 0.4 MG SL tablet Place 0.4 mg under the tongue every 5 (five) minutes as needed for chest pain.    06/17/2018 at Unknown time  . ranitidine (ZANTAC) 300 MG capsule Take 1 capsule by mouth every evening.  1 06/17/2018 at Unknown time  . rifaximin (XIFAXAN) 550 MG TABS tablet Take 550 mg by mouth 2 (two) times daily.   06/17/2018 at Unknown time  . sevelamer carbonate (RENVELA) 800 MG tablet Take 800-2,400 mg by mouth See admin instructions.  Take 2400 mg by mouth 3 times daily with meals and take 800 mg by mouth with snacks   06/17/2018 at Unknown time  . HYDROcodone-acetaminophen (NORCO/VICODIN) 5-325 MG tablet Take 1 tablet by mouth every 6 (six) hours as needed for moderate pain or severe pain. (Patient not taking: Reported on 06/17/2018) 15 tablet 0 Not Taking at Unknown time  . hydrocortisone cream 0.5 % Apply topically 3 (three) times daily. (Patient not taking: Reported on 06/17/2018) 30 g 0 Not Taking at Unknown time   Home medication reconciliation was completed with the patient.   Scheduled Inpatient Medications:   . amLODipine  10 mg Oral Daily  . aspirin EC  81 mg Oral Daily  . atorvastatin  20 mg Oral QPM  . Chlorhexidine Gluconate Cloth  6 each Topical Q0600  . cholecalciferol  400 Units Oral Daily  . epoetin (EPOGEN/PROCRIT) injection  4,000 Units Intravenous Q T,Th,Sa-HD  . famotidine  20 mg Oral BID  . FLUoxetine  20 mg Oral QHS  . insulin aspart  0-15 Units Subcutaneous TID WC  . insulin aspart  0-5 Units Subcutaneous QHS  . lipase/protease/amylase  36,000 Units Oral BID WC   And  . lipase/protease/amylase  24,000 Units Oral Q lunch  . multivitamin  1 tablet Oral Daily  . pantoprazole (PROTONIX) IV  40 mg Intravenous Q12H  . rifaximin  550 mg Oral BID  . sevelamer carbonate  2,400 mg Oral TID WC  . sevelamer carbonate  800 mg Oral With snacks  . cyanocobalamin  1,000 mcg Oral Daily    Continuous Inpatient Infusions:   . sodium chloride Stopped (06/18/18 1139)    PRN Inpatient Medications:  sodium chloride, acetaminophen **OR** acetaminophen, albuterol, bisacodyl, diazepam, hydrocortisone cream, nitroGLYCERIN, senna-docusate  Family History: family history includes Cancer in her father; Diabetes in her mother; Kidney disease in her mother and sister.   GI Family History: Negative  Social History:   reports that she quit smoking about 3 years ago. Her smoking use included cigarettes. She has a  20.00 pack-year smoking history. She has  never used smokeless tobacco. She reports that she drank alcohol. She reports that she has current or past drug history. Drug: Marijuana. The patient denies ETOH, tobacco, or drug use.    Review of Systems: Review of Systems - History obtained from the patient General ROS: positive for  - fatigue negative for - night sweats or sleep disturbance Psychological ROS: negative ENT ROS: negative Hematological and Lymphatic ROS: negative Endocrine ROS: negative for - palpitations, skin changes or temperature intolerance Respiratory ROS: no cough, shortness of breath, or wheezing Cardiovascular ROS: no chest pain or dyspnea on exertion Genito-Urinary ROS: no dysuria, trouble voiding, or hematuria Neurological ROS: no TIA or stroke symptoms Dermatological ROS: negative  Physical Examination: BP (!) 154/65 (BP Location: Right Arm)   Pulse 76   Temp 98.7 F (37.1 C) (Oral)   Resp 18   Ht 5\' 5"  (1.651 m)   Wt 85 kg   SpO2 97%   BMI 31.18 kg/m  Physical Exam  Data: Lab Results  Component Value Date   WBC 7.7 06/19/2018   HGB 7.9 (L) 06/19/2018   HCT 23.0 (L) 06/19/2018   MCV 89.9 06/19/2018   PLT 132 (L) 06/19/2018   Recent Labs  Lab 06/18/18 0220 06/18/18 1245 06/19/18 1025  HGB 6.8* 8.1* 7.9*   Lab Results  Component Value Date   NA 133 (L) 06/19/2018   K 5.1 06/19/2018   CL 95 (L) 06/19/2018   CO2 27 06/19/2018   BUN 45 (H) 06/19/2018   CREATININE 6.34 (H) 06/19/2018   Lab Results  Component Value Date   ALT 13 06/18/2018   AST 19 06/18/2018   ALKPHOS 88 06/18/2018   BILITOT 0.4 06/18/2018   No results for input(s): APTT, INR, PTT in the last 168 hours. CBC Latest Ref Rng & Units 06/19/2018 06/18/2018 06/18/2018  WBC 3.6 - 11.0 K/uL 7.7 - 5.9  Hemoglobin 12.0 - 16.0 g/dL 7.9(L) 8.1(L) 6.8(L)  Hematocrit 35.0 - 47.0 % 23.0(L) 24.0(L) 19.6(L)  Platelets 150 - 440 K/uL 132(L) - 127(L)    STUDIES: US Venous Img Lower  Bilateral  Result Date: 06/18/2018 CLINICAL DATA:  57 year old female with bilateral leg swelling and cramping. EXAM: BILATERAL LOWER EXTREMITY VENOUS DOPPLER ULTRASOUND TECHNIQUE: Gray-scale sonography with graded compression, as well as color Doppler and duplex ultrasound were performed to evaluate the lower extremity deep venous systems from the level of the common femoral vein and including the common femoral, femoral, profunda femoral, popliteal and calf veins including the posterior tibial, peroneal and gastrocnemius veins when visible. The superficial great saphenous vein was also interrogated. Spectral Doppler was utilized to evaluate flow at rest and with distal augmentation maneuvers in the common femoral, femoral and popliteal veins. COMPARISON:  None. FINDINGS: RIGHT LOWER EXTREMITY Common Femoral Vein: No evidence of thrombus. Normal compressibility, respiratory phasicity and response to augmentation. Saphenofemoral Junction: No evidence of thrombus. Normal compressibility and flow on color Doppler imaging. Profunda Femoral Vein: No evidence of thrombus. Normal compressibility and flow on color Doppler imaging. Femoral Vein: No evidence of thrombus. Normal compressibility, respiratory phasicity and response to augmentation. Popliteal Vein: No evidence of thrombus. Normal compressibility, respiratory phasicity and response to augmentation. Calf Veins: No evidence of thrombus. Normal compressibility and flow on color Doppler imaging. Superficial Great Saphenous Vein: No evidence of thrombus. Normal compressibility. Venous Reflux:  None. Other Findings:  None. LEFT LOWER EXTREMITY Common Femoral Vein: No evidence of thrombus. Normal compressibility, respiratory phasicity and response to augmentation. Saphenofemoral Junction: No evidence of  thrombus. Normal compressibility and flow on color Doppler imaging. Profunda Femoral Vein: No evidence of thrombus. Normal compressibility and flow on color Doppler  imaging. Femoral Vein: No evidence of thrombus. Normal compressibility, respiratory phasicity and response to augmentation. Popliteal Vein: No evidence of thrombus. Normal compressibility, respiratory phasicity and response to augmentation. Calf Veins: No evidence of thrombus. Normal compressibility and flow on color Doppler imaging. Superficial Great Saphenous Vein: No evidence of thrombus. Normal compressibility. Venous Reflux:  None. Other Findings:  None. IMPRESSION: No evidence of deep venous thrombosis. Electronically Signed   By: Anner Crete M.D.   On: 06/18/2018 02:14   @IMAGES @  Assessment: 1.  Symptomatic anemia-status post 1 unit of packed red blood cell transfusion.  I reviewed nephrology notes suggesting that possibly erythropoietin will be restarted thereby increasing the patient's hemoglobin again.  At this time I am not convinced the patient had any significant gastrointestinal bleeding, however we will continue to monitor.  No GI symptoms.  2.  Right renal masses-per oncology and urology.  3.  End-stage renal disease-on hemodialysis per Dr. Candiss Norse and nephrology  Recommendations:  1.  Follow hemoglobin hematocrit and for signs of overt GI bleeding. 2.  No plans for endoscopy colonoscopy at this time.  Plans may be altered based upon patient's response to therapy  Thank you for the consult. Please call with questions or concerns.  Olean Ree, "Lanny Hurst MD [facility] [place],  [id number] [phone]  06/19/2018 6:00 PM

## 2018-06-19 NOTE — Progress Notes (Signed)
[place], [place] 06/19/18  Subjective:   Patient known to our practice from outpatient dialysis.  She was admitted for low hemoglobin, blood in the stool.  She also had severe bilateral cramps in her thighs.  She came to ER for examination.  She received blood transfusion yesterday.  Nephrology consult has been requested to arrange for dialysis while inpatient.  At present she denies any acute complaints of shortness of breath.   HEMODIALYSIS FLOWSHEET:  Blood Flow Rate (mL/min): 400 mL/min Arterial Pressure (mmHg): -150 mmHg Venous Pressure (mmHg): 190 mmHg Transmembrane Pressure (mmHg): 60 mmHg Ultrafiltration Rate (mL/min): 430 mL/min Dialysate Flow Rate (mL/min): 500 ml/min Conductivity: Machine : 13.9 Conductivity: Machine : 13.9 Dialysis Fluid Bolus: Normal Saline Bolus Amount (mL): 250 mL    Objective:  Vital signs in last 24 hours:  Temp:  [98 F (36.7 C)-98.4 F (36.9 C)] 98 F (36.7 C) (09/28 1345) Pulse Rate:  [58-73] 70 (09/28 1354) Resp:  [10-19] 13 (09/28 1354) BP: (128-172)/(53-64) 170/58 (09/28 1330) SpO2:  [97 %-100 %] 99 % (09/28 1354) Weight:  [85 kg-88.8 kg] 85 kg (09/28 1345)  Weight change:  Filed Weights   06/17/18 2343 06/19/18 0951 06/19/18 1345  Weight: 78.9 kg 88.8 kg 85 kg    Intake/Output:    Intake/Output Summary (Last 24 hours) at 06/19/2018 1428 Last data filed at 06/19/2018 1345 Gross per 24 hour  Intake -  Output 1000 ml  Net -1000 ml     Physical Exam: General:  Lying in the bed, no acute distress  HEENT  anicteric, moist oral mucous membranes  Neck  supple, no masses  Pulm/lungs  normal breathing effort, clear  CVS/Heart  no rub or gallop  Abdomen:   Soft, nontender  Extremities:  Trace edema  Neurologic:  Alert, oriented  Skin:  No acute rashes  Access:  AV graft       Basic Metabolic Panel:  Recent Labs  Lab 06/17/18 1634 06/18/18 0220 06/18/18 0601 06/19/18 1025  NA 135 138   --  133*  K 3.9 3.3*  --  5.1  CL 96* 98  --  95*  CO2 28 29  --  27  GLUCOSE 285* 86  --  183*  BUN 17 22*  --  45*  CREATININE 2.83* 4.12*  --  6.34*  CALCIUM 8.2* 8.3*  --  8.3*  MG  --  2.1  --   --   PHOS  --   --  3.3 2.8     CBC: Recent Labs  Lab 06/17/18 1634 06/18/18 0220 06/18/18 1245 06/19/18 1025  WBC 5.4 5.9  --  7.7  NEUTROABS  --  2.6  --   --   HGB 7.5* 6.8* 8.1* 7.9*  HCT 21.5* 19.6* 24.0* 23.0*  MCV 89.1 89.4  --  89.9  PLT 135* 127*  --  132*      Lab Results  Component Value Date   HEPBSAG Negative 05/04/2017   HEPBIGM  06/12/2010    NEGATIVE (NOTE) High levels of Hepatitis B Core IgM antibody are detectable during the acute stage of Hepatitis B. This antibody is used to differentiate current from past HBV infection.       Microbiology:  No results found for this or any previous visit (from the past 240 hour(s)).  Coagulation Studies: No results for input(s): LABPROT, INR in the last 72 hours.  Urinalysis: No results for input(s): COLORURINE, LABSPEC, [place], [place], [place], BILIRUBINUR, KETONESUR,  PROTEINUR, UROBILINOGEN, NITRITE, LEUKOCYTESUR in the last 72 hours.  Invalid input(s): APPERANCEUR    Imaging: Dg Thoracic Spine 2 View  Result Date: 06/17/2018 CLINICAL DATA:  [place] backwards yesterday landing onto back with mid to low back pain. EXAM: THORACIC SPINE 2 VIEWS COMPARISON:  Chest x-ray 06/17/2018 and 02/20/2018 FINDINGS: Vertebral body alignment and heights are within normal. There is no acute compression fracture or subluxation. Pedicles are intact. Remainder of the exam is unchanged. IMPRESSION: No acute findings. Electronically Signed   By: Marin Olp M.D.   On: 06/17/2018 18:05   Dg Lumbar Spine Complete  Result Date: 06/17/2018 CLINICAL DATA:  Fall backwards yesterday landing onto back with low back pain. EXAM: LUMBAR SPINE - COMPLETE 4+ VIEW COMPARISON:  KUB 05/05/2018 and CT 03/02/2018 FINDINGS: Vertebral body  alignment, heights and disc space heights are normal. There is minimal spondylosis present. No evidence of compression fracture or subluxation. Calcified plaque over the abdominal aorta. IMPRESSION: No acute findings. Electronically Signed   By: Marin Olp M.D.   On: 06/17/2018 18:08   US Venous Img Lower Bilateral  Result Date: 06/18/2018 CLINICAL DATA:  57 year old female with bilateral leg swelling and cramping. EXAM: BILATERAL LOWER EXTREMITY VENOUS DOPPLER ULTRASOUND TECHNIQUE: Gray-scale sonography with graded compression, as well as color Doppler and duplex ultrasound were performed to evaluate the lower extremity deep venous systems from the level of the common femoral vein and including the common femoral, femoral, profunda femoral, popliteal and calf veins including the posterior tibial, peroneal and gastrocnemius veins when visible. The superficial great saphenous vein was also interrogated. Spectral Doppler was utilized to evaluate flow at rest and with distal augmentation maneuvers in the common femoral, femoral and popliteal veins. COMPARISON:  None. FINDINGS: RIGHT LOWER EXTREMITY Common Femoral Vein: No evidence of thrombus. Normal compressibility, respiratory phasicity and response to augmentation. Saphenofemoral Junction: No evidence of thrombus. Normal compressibility and flow on color Doppler imaging. Profunda Femoral Vein: No evidence of thrombus. Normal compressibility and flow on color Doppler imaging. Femoral Vein: No evidence of thrombus. Normal compressibility, respiratory phasicity and response to augmentation. Popliteal Vein: No evidence of thrombus. Normal compressibility, respiratory phasicity and response to augmentation. Calf Veins: No evidence of thrombus. Normal compressibility and flow on color Doppler imaging. Superficial Great Saphenous Vein: No evidence of thrombus. Normal compressibility. Venous Reflux:  None. Other Findings:  None. LEFT LOWER EXTREMITY Common Femoral  Vein: No evidence of thrombus. Normal compressibility, respiratory phasicity and response to augmentation. Saphenofemoral Junction: No evidence of thrombus. Normal compressibility and flow on color Doppler imaging. Profunda Femoral Vein: No evidence of thrombus. Normal compressibility and flow on color Doppler imaging. Femoral Vein: No evidence of thrombus. Normal compressibility, respiratory phasicity and response to augmentation. Popliteal Vein: No evidence of thrombus. Normal compressibility, respiratory phasicity and response to augmentation. Calf Veins: No evidence of thrombus. Normal compressibility and flow on color Doppler imaging. Superficial Great Saphenous Vein: No evidence of thrombus. Normal compressibility. Venous Reflux:  None. Other Findings:  None. IMPRESSION: No evidence of deep venous thrombosis. Electronically Signed   By: Anner Crete M.D.   On: 06/18/2018 02:14   Dg Chest Portable 1 View  Result Date: 06/17/2018 CLINICAL DATA:  Rectal bleeding with abdominal bloating.  Dialysis. EXAM: PORTABLE CHEST 1 VIEW COMPARISON:  05/05/2018 FINDINGS: Lungs are adequately inflated without focal consolidation or effusion. Cardiomediastinal silhouette and remainder of the exam is unchanged. IMPRESSION: No active disease. Electronically Signed   By: Marin Olp M.D.   On: 06/17/2018  16:57     Medications:   . sodium chloride Stopped (06/18/18 1139)   . amLODipine  10 mg Oral Daily  . aspirin EC  81 mg Oral Daily  . atorvastatin  20 mg Oral QPM  . Chlorhexidine Gluconate Cloth  6 each Topical Q0600  . cholecalciferol  400 Units Oral Daily  . epoetin (EPOGEN/PROCRIT) injection  4,000 Units Intravenous Q T,Th,Sa-HD  . famotidine  20 mg Oral BID  . FLUoxetine  20 mg Oral QHS  . insulin aspart  0-15 Units Subcutaneous TID WC  . insulin aspart  0-5 Units Subcutaneous QHS  . lipase/protease/amylase  36,000 Units Oral BID WC   And  . lipase/protease/amylase  24,000 Units Oral Q lunch  .  multivitamin  1 tablet Oral Daily  . pantoprazole (PROTONIX) IV  40 mg Intravenous Q12H  . rifaximin  550 mg Oral BID  . sevelamer carbonate  2,400 mg Oral TID WC  . sevelamer carbonate  800 mg Oral With snacks  . cyanocobalamin  1,000 mcg Oral Daily   sodium chloride, acetaminophen **OR** acetaminophen, albuterol, bisacodyl, diazepam, nitroGLYCERIN, senna-docusate  Assessment/ Plan:  57 y.o. female with diabetes, hypertension, hyperlipidemia, diabetic retinopathy, diabetic neuropathy, diabetic gastroparesis, pancreatic insufficiency, depression, GERD. Renal masses, M-spike   MWF/ [facility] Payor, left arm AVG  1.  End-stage renal disease 2.  Anemia of chronic kidney disease and blood loss 3.  Secondary hyperparathyroidism 4.  Renal masses/followed by Dr. Erlene Quan urologist  Patient seen during dialysis today.  She is tolerating it well.  She will continue on phosphorus binders.  As far as renal masses are concerned, she was evaluated by Dr. Erlene Quan on September 20.  It was felt that her renal masses are chronic and they have not changed over the last 2 years.  Continued surveillance was recommended.  Patient reports decreased energy, fatigue and tiredness.  As such as risks and benefits of EPO were discussed with patient under current circumstances.  Given that she would be closely followed by urology for surveillance of renal masses, it was felt that it would be safe to give her Epogen for prevention and treatment of symptomatic anemia.  Patient agreed with this plan.  We will continue Epogen with hemodialysis as well as outpatient.   LOS: [place] 9/28/20192:28 PM  [place], [place]  Note: This note was prepared with Dragon dictation. Any transcription errors are unintentional

## 2018-06-19 NOTE — Progress Notes (Signed)
Pt returned from dialysis. VS wdl, pt resting in chair and visiting with family. Medications given, delayed due to treatment.

## 2018-06-19 NOTE — Progress Notes (Signed)
Pre HD assessment    06/19/18 0952  Neurological  Level of Consciousness Alert  Orientation Level Oriented X4  Respiratory  Respiratory Pattern Regular;Unlabored  Chest Assessment Chest expansion symmetrical  Cardiac  ECG Monitor Yes  Cardiac Rhythm NSR  Vascular  R Radial Pulse +2  L Radial Pulse +2  Edema Generalized  Integumentary  Integumentary (WDL) X  Skin Color Appropriate for ethnicity  Musculoskeletal  Musculoskeletal (WDL) X  Generalized Weakness Yes  Assistive Device None  GU Assessment  Genitourinary (WDL) X  Genitourinary Symptoms  (HD)  Psychosocial  Psychosocial (WDL) WDL

## 2018-06-19 NOTE — Progress Notes (Signed)
Pharmacy consult noted for Protonix 40 mg IV BID to start now. Order entered. Pharmacy will sign off. GI consult pending - please consider oral therapy as soon as appropriate.  Binyamin Nelis A. [place], [place].D., BCPS Clinical Pharmacist 06/19/2018 11:07

## 2018-06-19 NOTE — Progress Notes (Signed)
Pre HD assessment    06/19/18 0951  Vital Signs  Temp 98.3 F (36.8 C)  Temp Source Oral  Pulse Rate 65  Pulse Rate Source Monitor  Resp 15  BP (!) 142/53  BP Location Right Arm  BP Method Automatic  Patient Position (if appropriate) Lying  Oxygen Therapy  SpO2 100 %  O2 Device Room Air  Pain Assessment  Pain Scale 0-10  Pain Score 0  Dialysis Weight  Weight 88.8 kg  Type of Weight Pre-Dialysis  Time-Out for Hemodialysis  What Procedure? HD   Pt Identifiers(min of two) First/Last Name;MRN/Account#  Correct Site? Yes  Correct Side? Yes  Correct Procedure? Yes  Consents Verified? Yes  Rad Studies Available? N/A  Safety Precautions Reviewed? Yes  Engineer, civil (consulting) Number  (1A)  Station Number 4  UF/Alarm Test Passed  Conductivity: Meter 14  Conductivity: Machine  13.9  pH 7.6  Reverse Osmosis main  Normal Saline Lot Number [id number]  Dialyzer Lot Number [id number]  Disposable Set Lot Number [id number]  Machine Temperature 98.6 F (37 C)  Musician and Audible Yes  Blood Lines Intact and Secured Yes  Pre Treatment Patient Checks  Vascular access used during treatment Graft  Hepatitis B Surface Antigen Results Negative  Date Hepatitis B Surface Antigen Drawn 03/09/18  Hepatitis B Surface Antibody  (>10)  Date Hepatitis B Surface Antibody Drawn 03/09/18  Hemodialysis Consent Verified Yes  Hemodialysis Standing Orders Initiated Yes  ECG (Telemetry) Monitor On Yes  Prime Ordered Normal Saline  Length of  DialysisTreatment -hour(s) 3.5 Hour(s)  Dialyzer Elisio 17H NR  Dialysate 3K, 2.5 Ca  Dialysis Anticoagulant None  Dialysate Flow Ordered 800  Blood Flow Rate Ordered 400 mL/min  Ultrafiltration Goal 0.5 Liters  Pre Treatment Labs Phosphorus (CBC)  Dialysis Blood Pressure Support Ordered Normal Saline  Education / Care Plan  Dialysis Education Provided Yes  Documented Education in Care Plan Yes  Fistula / Graft Right Upper arm Arteriovenous fistula   No Placement Date or Time found.   Placed prior to admission: Yes  Orientation: Right  Access Location: Upper arm  Access Type: Arteriovenous fistula  Site Condition No complications  Fistula / Graft Assessment Present;Thrill;Bruit  Drainage Description None

## 2018-06-20 LAB — CBC
HCT: 22.4 % — ABNORMAL LOW (ref 35.0–47.0)
Hemoglobin: 7.7 g/dL — ABNORMAL LOW (ref 12.0–16.0)
MCH: 30.7 pg (ref 26.0–34.0)
MCHC: 34.6 g/dL (ref 32.0–36.0)
MCV: 88.8 fL (ref 80.0–100.0)
Platelets: 123 10*3/uL — ABNORMAL LOW (ref 150–440)
RBC: 2.52 MIL/uL — ABNORMAL LOW (ref 3.80–5.20)
RDW: 14.8 % — ABNORMAL HIGH (ref 11.5–14.5)
WBC: 5.7 10*3/uL (ref 3.6–11.0)

## 2018-06-20 LAB — GLUCOSE, CAPILLARY: Glucose-Capillary: 178 mg/dL — ABNORMAL HIGH (ref 70–99)

## 2018-06-20 IMAGING — CR DG CHEST 2V
2 series · 2 of 2 positions shown · non-contrast
Comparison: 01/17/2018

CLINICAL DATA: Cough and shortness of breath

EXAM:
CHEST - 2 VIEW

[chest lat]
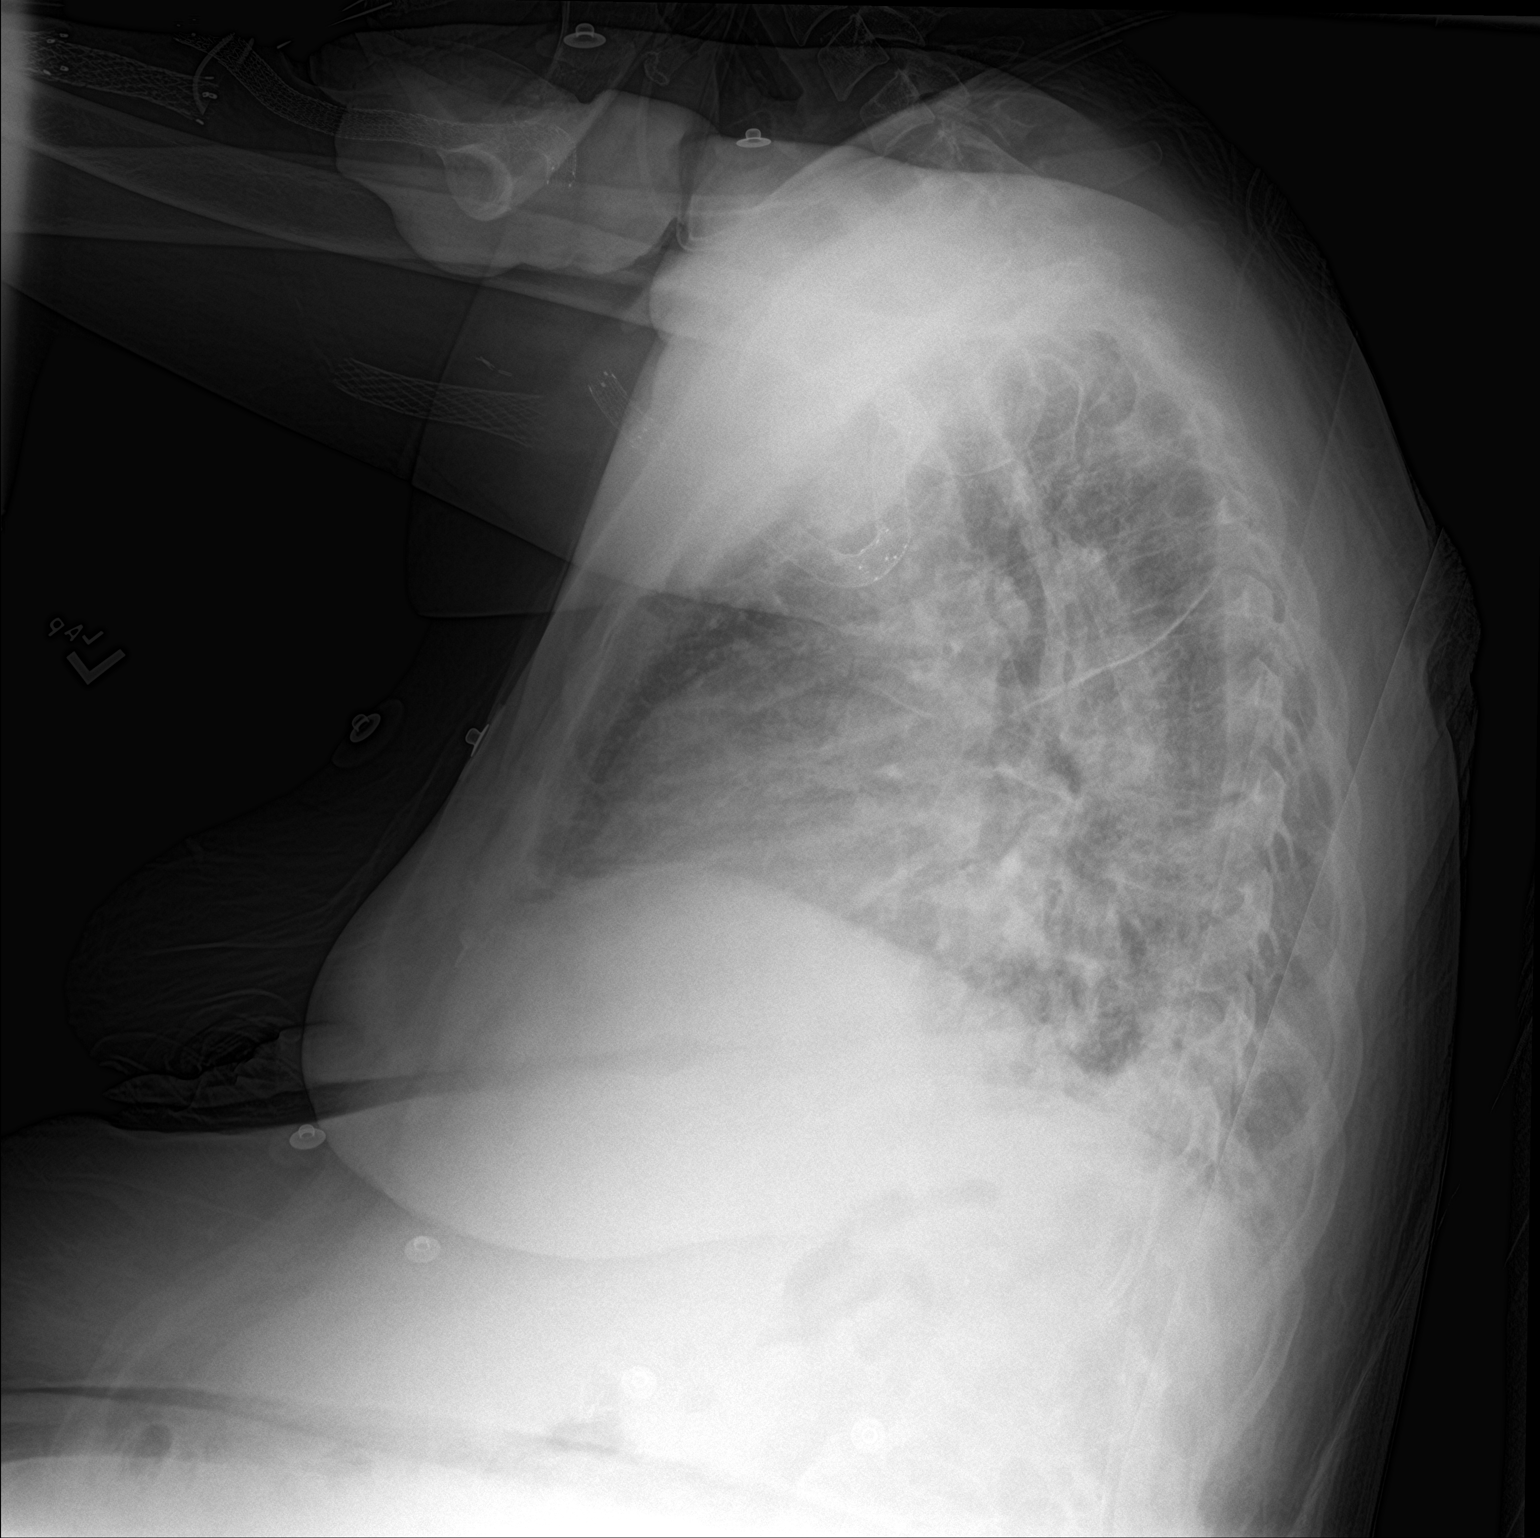

[chest ap]
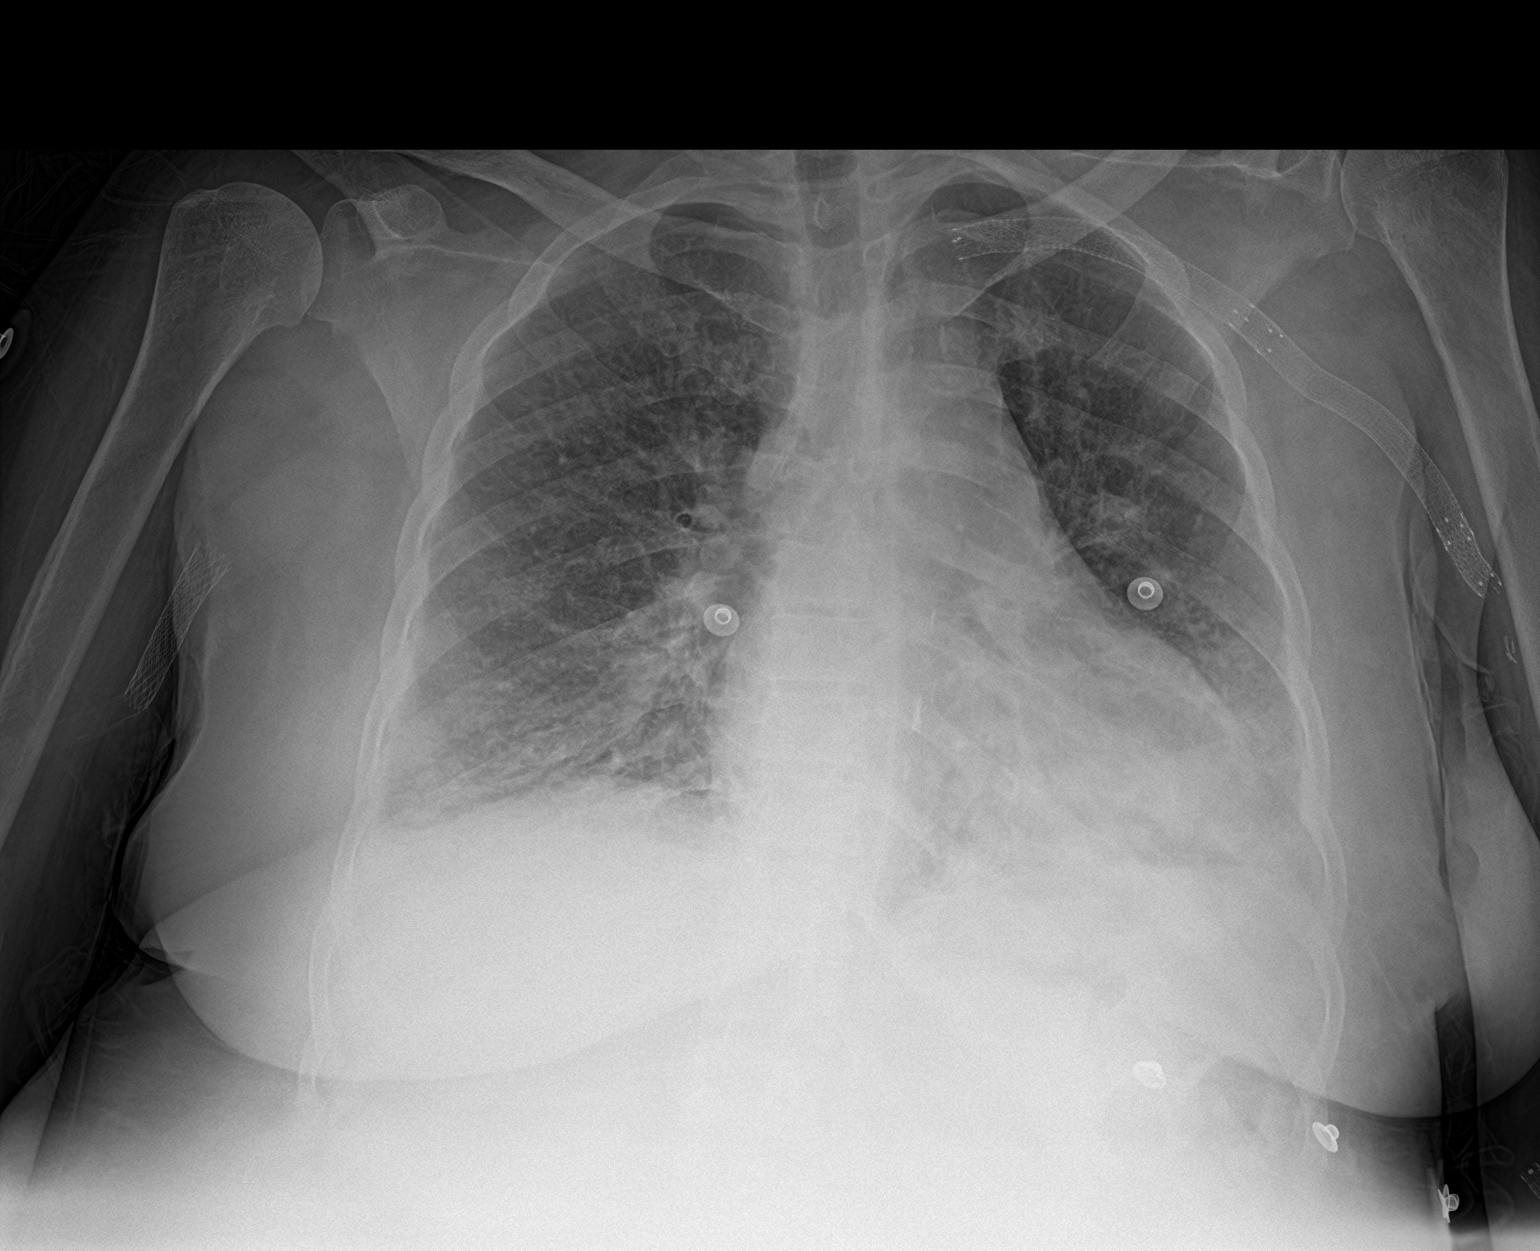

[2 of 2 positions shown; findings below may reference images not displayed]

FINDINGS: Cardiac shadow is stable. Diffuse venous stenting is noted in the
left upper extremity related to dialysis shunt. Lungs are well
aerated bilaterally. Patchy bibasilar infiltrates are noted left
greater than right new from the prior exam. No sizable effusion is
seen.
IMPRESSION: New bibasilar patchy infiltrates.

## 2018-06-20 MED ORDER — PANTOPRAZOLE SODIUM 40 MG PO TBEC
40.0000 mg | DELAYED_RELEASE_TABLET | Freq: Every day | ORAL | Status: DC
  Administered 2018-06-20: 40 mg via ORAL
  Filled 2018-06-20: qty 1

## 2018-06-20 NOTE — Discharge Summary (Signed)
[facility] NAME: [name]    MR#:  [id number]  DATE OF BIRTH:  [birth year]  DATE OF ADMISSION:  06/17/2018 ADMITTING PHYSICIAN: Arta Silence, MD  DATE OF DISCHARGE: 06/20/2018  PRIMARY CARE PHYSICIAN: Freddy Finner, NP    ADMISSION DIAGNOSIS:  ESRD (end stage renal disease) on dialysis ([place]) [N18.6, Z99.2] Muscle spasms of both lower extremities [M62.838] Gastrointestinal hemorrhage, unspecified gastrointestinal hemorrhage type [K92.2] Anemia, unspecified type [D64.9]  DISCHARGE DIAGNOSIS:  Active Problems:   Symptomatic anemia   SECONDARY DIAGNOSIS:   Past Medical History:  Diagnosis Date  . Anemia   . Anginal pain ([place])   . Anxiety   . Arthritis   . Asthma   . Broken wrist   . Bronchitis   . chronic diastolic CHF 0/16/0109  . COPD (chronic obstructive pulmonary disease) ([place])   . Coronary artery disease    a. cath 2013: stenting to RCA (report not available); b. cath 2014: LM nl, pLAD 40%, mLAD nl, ost LCx 40%, mid LCx nl, pRCA 30% @ site of prior stent, mRCA 50%  . Depression   . Diabetes mellitus without complication ([place])   . Diabetic neuropathy ([place])   . dialysis 2006  . Diverticulosis   . Dizziness   . Dyspnea   . Elevated lipids   . Environmental and seasonal allergies   . ESRD (end stage renal disease) on dialysis ([place])    M-W-F  . GERD (gastroesophageal reflux disease)   . Headache   . History of hiatal hernia   . HOH (hard of hearing)   . Hx of pancreatitis 2015  . Hypertension   . Lower extremity edema   . Mitral regurgitation    a. echo 10/2013: EF 62%, noWMA, mildly dilated LA, mild to mod MR/TR, GR1DD  . Myocardial infarction ([place])   . Orthopnea   . Pneumonia   . Renal cancer ([place])   . Renal insufficiency    Pt is on dialysis on M,W + F.  . Wheezing     HOSPITAL COURSE:   57 year old female with end-stage renal disease on hemodialysis, diabetes and CAD who presented to the  emergency room due to weakness and noted to have low hemoglobin.   1.  Acute on chronic anemia status post 1 unit PRBC: She received 1 unit of PRBC while in the hospital.  Her hemoglobin remains relatively stable. Epogen was temporarily discontinued due to history of renal cell carcinoma however as per Dr.Singh E. Pridgen will be restarted.  She was evaluated by GI during this hospital stay. There is no convincing evidence of GI bleeding at this time.  Patient underwent EGD in March 18, 2016 by Dr. Allen Norris revealing reactive gastropathy on biopsies of apparent gastritis.  Patient also had grade a esophagitis.  Colonoscopy performed 08/12/2016 by Dr. Loistine Simas revealed multiple adenomatous and hyperplastic polyps as well as diverticulosis of the colon.  Biopsies were negative for microscopic colitis.  She can follow-up with GI as an outpatient  2.  End-stage renal disease on hemodialysis: Continue dialysis as per her normal routine Monday, Wednesday and Friday.  3.  Renal cell carcinoma: Patient will follow-up with urology Epogen will be restarted as these renal masses have been stable.  4.  Hypokalemia this was repleted  5.  Diabetes: Continue ADA diet   6.  CAD: Continue aspirin and atorvastatin  7.  Essential hypertension: Continue Norvasc  8.  Depression: Continue Prozac   DISCHARGE  CONDITIONS AND DIET:   Stable for discharge on diabetic heart healthy diet and renal diet  CONSULTS OBTAINED:  Treatment Team:  Arta Silence, MD Jonathon Bellows, MD  DRUG ALLERGIES:   Allergies  Allergen Reactions  . Ace Inhibitors Swelling and Anaphylaxis  . Ativan [Lorazepam] Other (See Comments)    Reaction:Hallucinations and headaches  . Compazine [Prochlorperazine Edisylate] Anaphylaxis, Nausea And Vomiting and Other (See Comments)    Other reaction(s): dystonia from this vs. Reglan, 23 Jul - patient relates that she takes promethazine frequently with no problems  .  Sumatriptan Succinate Other (See Comments)    Other reaction(s): delirium and hallucinations per [facility] records  . Dilaudid [Hydromorphone Hcl] Other (See Comments)    Delirium   . Ondansetron Other (See Comments)    hallucinations    . Zofran [Ondansetron Hcl] Other (See Comments)    Reaction:  hallucinations   . Codeine Nausea And Vomiting  . Gabapentin Other (See Comments)    Reaction:  Unknown    . Lac Bovis Nausea And Vomiting  . Losartan Nausea Only  . Oxycodone Anxiety  . Prochlorperazine Other (See Comments)    Reaction:  Unknown . Patient does not remember reaction but she does have vertigo and anxiety along with n and v at times. Could be used to treat any of these   . Reglan [Metoclopramide] Other (See Comments)    Per patient her Dr. Evelina Bucy her off it   . Scopolamine Other (See Comments)    Dizziness, also has vertigo already  . Tape Rash  . Tapentadol Rash    DISCHARGE MEDICATIONS:   Allergies as of 06/20/2018      Reactions   Ace Inhibitors Swelling, Anaphylaxis   Ativan [lorazepam] Other (See Comments)   Reaction:Hallucinations and headaches   Compazine [prochlorperazine Edisylate] Anaphylaxis, Nausea And Vomiting, Other (See Comments)   Other reaction(s): dystonia from this vs. Reglan, 23 Jul - patient relates that she takes promethazine frequently with no problems   Sumatriptan Succinate Other (See Comments)   Other reaction(s): delirium and hallucinations per [facility] records   Dilaudid [hydromorphone Hcl] Other (See Comments)   Delirium   Ondansetron Other (See Comments)   hallucinations    Zofran [ondansetron Hcl] Other (See Comments)   Reaction:  hallucinations    Codeine Nausea And Vomiting   Gabapentin Other (See Comments)   Reaction:  Unknown    Lac Bovis Nausea And Vomiting   Losartan Nausea Only   Oxycodone Anxiety   Prochlorperazine Other (See Comments)   Reaction:  Unknown . Patient does not remember reaction but she does have vertigo and  anxiety along with n and v at times. Could be used to treat any of these   Reglan [metoclopramide] Other (See Comments)   Per patient her Dr. Evelina Bucy her off it    Scopolamine Other (See Comments)   Dizziness, also has vertigo already   Tape Rash   Tapentadol Rash      Medication List    STOP taking these medications   HYDROcodone-acetaminophen 5-325 MG tablet Commonly known as:  NORCO/VICODIN     TAKE these medications   albuterol (2.5 MG/3ML) 0.083% nebulizer solution Commonly known as:  PROVENTIL Take 2.5 mg by nebulization every 4 (four) hours as needed for wheezing or shortness of breath.   albuterol 108 (90 Base) MCG/ACT inhaler Commonly known as:  PROVENTIL HFA;VENTOLIN HFA Inhale 2 puffs into the lungs every 6 (six) hours as needed for wheezing or shortness of breath.  amLODipine 10 MG tablet Commonly known as:  NORVASC Take 10 mg by mouth daily.   aspirin EC 81 MG tablet Take 81 mg by mouth daily.   atorvastatin 20 MG tablet Commonly known as:  LIPITOR Take 20 mg by mouth every evening.   Biotin 1 MG Caps Take 1 mg by mouth daily.   cetirizine 10 MG tablet Commonly known as:  ZYRTEC Take 10 mg by mouth daily.   cholecalciferol 400 units Tabs tablet Commonly known as:  VITAMIN D Take 400 Units by mouth daily.   cyanocobalamin 1000 MCG tablet Take 1,000 mcg by mouth daily.   diclofenac sodium 1 % Gel Commonly known as:  VOLTAREN Apply 1 application topically daily as needed (for rash).   FLUoxetine 20 MG capsule Commonly known as:  PROZAC Take 1 capsule (20 mg total) by mouth daily. What changed:  when to take this   hydrocortisone cream 0.5 % Apply topically 3 (three) times daily.   lidocaine 5 % Commonly known as:  LIDODERM Place 1 patch onto the skin every 12 (twelve) hours. Remove & Discard patch within 12 hours or as directed by MD   lipase/protease/amylase 12000 units Cpep capsule Commonly known as:  CREON Take 36000 mg in the morning,  24000 midday & 36000 in the evening   multivitamin Tabs tablet Take 1 tablet by mouth daily.   nitroGLYCERIN 0.4 MG SL tablet Commonly known as:  NITROSTAT Place 0.4 mg under the tongue every 5 (five) minutes as needed for chest pain.   NOVOLOG FLEXPEN 100 UNIT/ML FlexPen Generic drug:  insulin aspart Inject 4-15 Units into the skin 4 (four) times daily as needed for high blood sugar (> 100, per sliding scale).   ranitidine 300 MG capsule Commonly known as:  ZANTAC Take 1 capsule by mouth every evening.   rifaximin 550 MG Tabs tablet Commonly known as:  XIFAXAN Take 550 mg by mouth 2 (two) times daily.   sevelamer carbonate 800 MG tablet Commonly known as:  RENVELA Take 800-2,400 mg by mouth See admin instructions. Take 2400 mg by mouth 3 times daily with meals and take 800 mg by mouth with snacks         Today   CHIEF COMPLAINT:   Patient denies rectal bleeding or hematochezia or hematemesis.  Patient denies melena.  Patient denies abdominal pain.  Patient is ready to be discharged home.   VITAL SIGNS:  Blood pressure (!) 147/59, pulse 68, temperature 98.7 F (37.1 C), temperature source Oral, resp. rate 18, height 5\' 5"  (1.651 m), weight 85 kg, SpO2 97 %.   REVIEW OF SYSTEMS:  Review of Systems  Constitutional: Negative.  Negative for chills, fever and malaise/fatigue.  HENT: Negative.  Negative for ear discharge, ear pain, hearing loss, nosebleeds and sore throat.   Eyes: Negative.  Negative for blurred vision and pain.  Respiratory: Negative.  Negative for cough, hemoptysis, shortness of breath and wheezing.   Cardiovascular: Negative.  Negative for chest pain, palpitations and leg swelling.  Gastrointestinal: Negative.  Negative for abdominal pain, blood in stool, diarrhea, nausea and vomiting.  Genitourinary: Negative.  Negative for dysuria.  Musculoskeletal: Negative.  Negative for back pain.  Skin: Negative.   Neurological: Negative for dizziness,  tremors, speech change, focal weakness, seizures and headaches.  Endo/Heme/Allergies: Negative.  Does not bruise/bleed easily.  Psychiatric/Behavioral: Negative.  Negative for depression, hallucinations and suicidal ideas.     PHYSICAL EXAMINATION:  GENERAL:  57 y.o.-year-old patient lying in the bed  with no acute distress.  NECK:  Supple, no jugular venous distention. No thyroid enlargement, no tenderness.  LUNGS: Normal breath sounds bilaterally, no wheezing, rales,rhonchi  No use of accessory muscles of respiration.  CARDIOVASCULAR: S1, S2 normal. No murmurs, rubs, or gallops.  ABDOMEN: Soft, non-tender, non-distended. Bowel sounds present. No organomegaly or mass.  EXTREMITIES: No pedal edema, cyanosis, or clubbing.  PSYCHIATRIC: The patient is alert and oriented x 3.  SKIN: No obvious rash, lesion, or ulcer.   DATA REVIEW:   CBC Recent Labs  Lab 06/20/18 0538  WBC 5.7  HGB 7.7*  HCT 22.4*  PLT 123*    Chemistries  Recent Labs  Lab 06/18/18 0220 06/19/18 1025  NA 138 133*  K 3.3* 5.1  CL 98 95*  CO2 29 27  GLUCOSE 86 183*  BUN 22* 45*  CREATININE 4.12* 6.34*  CALCIUM 8.3* 8.3*  MG 2.1  --   AST 19  --   ALT 13  --   ALKPHOS 88  --   BILITOT 0.4  --     Cardiac Enzymes No results for input(s): TROPONINI in the last 168 hours.  Microbiology Results  @MICRORSLT48 @  RADIOLOGY:  No results found.    Allergies as of 06/20/2018      Reactions   Ace Inhibitors Swelling, Anaphylaxis   Ativan [lorazepam] Other (See Comments)   Reaction:Hallucinations and headaches   Compazine [prochlorperazine Edisylate] Anaphylaxis, Nausea And Vomiting, Other (See Comments)   Other reaction(s): dystonia from this vs. Reglan, 23 Jul - patient relates that she takes promethazine frequently with no problems   Sumatriptan Succinate Other (See Comments)   Other reaction(s): delirium and hallucinations per [facility] records   Dilaudid [hydromorphone Hcl] Other (See Comments)    Delirium   Ondansetron Other (See Comments)   hallucinations    Zofran [ondansetron Hcl] Other (See Comments)   Reaction:  hallucinations    Codeine Nausea And Vomiting   Gabapentin Other (See Comments)   Reaction:  Unknown    Lac Bovis Nausea And Vomiting   Losartan Nausea Only   Oxycodone Anxiety   Prochlorperazine Other (See Comments)   Reaction:  Unknown . Patient does not remember reaction but she does have vertigo and anxiety along with n and v at times. Could be used to treat any of these   Reglan [metoclopramide] Other (See Comments)   Per patient her Dr. Evelina Bucy her off it    Scopolamine Other (See Comments)   Dizziness, also has vertigo already   Tape Rash   Tapentadol Rash      Medication List    STOP taking these medications   HYDROcodone-acetaminophen 5-325 MG tablet Commonly known as:  NORCO/VICODIN     TAKE these medications   albuterol (2.5 MG/3ML) 0.083% nebulizer solution Commonly known as:  PROVENTIL Take 2.5 mg by nebulization every 4 (four) hours as needed for wheezing or shortness of breath.   albuterol 108 (90 Base) MCG/ACT inhaler Commonly known as:  PROVENTIL HFA;VENTOLIN HFA Inhale 2 puffs into the lungs every 6 (six) hours as needed for wheezing or shortness of breath.   amLODipine 10 MG tablet Commonly known as:  NORVASC Take 10 mg by mouth daily.   aspirin EC 81 MG tablet Take 81 mg by mouth daily.   atorvastatin 20 MG tablet Commonly known as:  LIPITOR Take 20 mg by mouth every evening.   Biotin 1 MG Caps Take 1 mg by mouth daily.   cetirizine 10 MG tablet Commonly  known as:  ZYRTEC Take 10 mg by mouth daily.   cholecalciferol 400 units Tabs tablet Commonly known as:  VITAMIN D Take 400 Units by mouth daily.   cyanocobalamin 1000 MCG tablet Take 1,000 mcg by mouth daily.   diclofenac sodium 1 % Gel Commonly known as:  VOLTAREN Apply 1 application topically daily as needed (for rash).   FLUoxetine 20 MG capsule Commonly  known as:  PROZAC Take 1 capsule (20 mg total) by mouth daily. What changed:  when to take this   hydrocortisone cream 0.5 % Apply topically 3 (three) times daily.   lidocaine 5 % Commonly known as:  LIDODERM Place 1 patch onto the skin every 12 (twelve) hours. Remove & Discard patch within 12 hours or as directed by MD   lipase/protease/amylase 12000 units Cpep capsule Commonly known as:  CREON Take 36000 mg in the morning, 24000 midday & 36000 in the evening   multivitamin Tabs tablet Take 1 tablet by mouth daily.   nitroGLYCERIN 0.4 MG SL tablet Commonly known as:  NITROSTAT Place 0.4 mg under the tongue every 5 (five) minutes as needed for chest pain.   NOVOLOG FLEXPEN 100 UNIT/ML FlexPen Generic drug:  insulin aspart Inject 4-15 Units into the skin 4 (four) times daily as needed for high blood sugar (> 100, per sliding scale).   ranitidine 300 MG capsule Commonly known as:  ZANTAC Take 1 capsule by mouth every evening.   rifaximin 550 MG Tabs tablet Commonly known as:  XIFAXAN Take 550 mg by mouth 2 (two) times daily.   sevelamer carbonate 800 MG tablet Commonly known as:  RENVELA Take 800-2,400 mg by mouth See admin instructions. Take 2400 mg by mouth 3 times daily with meals and take 800 mg by mouth with snacks          Management plans discussed with the patient and she is in agreement. Stable for discharge home  Patient should follow up with pcp  CODE STATUS:     Code Status Orders  (From admission, onward)         Start     Ordered   06/18/18 0449  Full code  Continuous     06/18/18 0449        Code Status History    Date Active Date Inactive Code Status Order ID Comments User Context   02/12/2018 1312 02/16/2018 0050 Full Code [id number]  Epifanio Lesches, MD ED   01/17/2018 1848 01/21/2018 1341 Full Code [id number]  Vaughan Basta, MD Inpatient   09/30/2017 1804 10/05/2017 2238 Full Code [id number]  Vaughan Basta, MD Inpatient    09/03/2017 2243 09/05/2017 1901 Full Code [id number]  Lance Coon, MD Inpatient   05/04/2017 1833 05/05/2017 2330 Full Code [id number]  Henreitta Leber, MD Inpatient   03/23/2017 1551 03/25/2017 1739 Full Code [id number]  Gladstone Lighter, MD Inpatient   04/21/2016 1559 04/21/2016 1717 Full Code [id number]  Nicholes Mango, MD Inpatient   04/08/2016 1415 04/11/2016 1947 Full Code [id number]  Theodoro Grist, MD Inpatient   03/15/2016 1322 03/20/2016 0019 Full Code [id number]  Bettey Costa, MD Inpatient   02/16/2016 1409 02/19/2016 2238 Full Code [id number]  Bettey Costa, MD ED   12/17/2015 1139 12/21/2015 2209 Full Code [id number]  Dustin Flock, MD ED   11/12/2015 1602 12/01/2015 1943 Full Code [id number]  Bettey Costa, MD Inpatient   10/07/2015 0937 10/12/2015 1959 Full Code [id number]  Fritzi Mandes, MD ED   10/03/2015 0355 10/05/2015 2230 Full Code [id number]  Jannifer Franklin,  Shanon Brow, MD Inpatient   08/31/2015 1600 09/03/2015 2209 Full Code [id number]  Demetrios Loll, MD ED   07/26/2015 1528 07/26/2015 2136 Full Code [id number]  Dionisio David, MD Inpatient   07/22/2015 1736 07/26/2015 1528 Full Code [id number]  Vaughan Basta, MD Inpatient   06/05/2015 2039 06/07/2015 2003 Full Code [id number]  Bettey Costa, MD Inpatient   05/21/2015 2142 05/24/2015 1626 Full Code [id number]  Henreitta Leber, MD Inpatient   04/24/2015 0855 04/26/2015 2211 Full Code [id number]  Demetrios Loll, MD Inpatient   04/01/2015 1558 04/05/2015 2007 Full Code [id number]  Epifanio Lesches, MD ED   03/06/2015 0805 03/09/2015 2111 Full Code [id number]  Juluis Mire, MD Inpatient   02/19/2015 1614 02/24/2015 1741 Full Code [id number]  Aldean Jewett, MD Inpatient      TOTAL TIME TAKING CARE OF THIS PATIENT: 38 minutes.    Note: This dictation was prepared with Dragon dictation along with smaller phrase technology. Any transcriptional errors that result from this process are unintentional.  Lien Lyman M.D on 06/20/2018 at 10:52 AM  Between 7am to 6pm - Pager -  [phone] After 6pm go to [email or web address] - password [email or web address] [facility] [place]  Office  [phone]  CC: Primary care physician; Freddy Finner, NP

## 2018-06-20 NOTE — Progress Notes (Signed)
Pt discharged to home with family. Reviewed D/C instructions with pt and family member. Pt to schedule follow up for 1 week with Dr. Vira Agar.

## 2018-06-21 LAB — TYPE AND SCREEN
ABO/RH(D): O POS
Antibody Screen: NEGATIVE
Unit division: 0
Unit division: 0
Unit division: 0

## 2018-06-21 LAB — BPAM RBC
Blood Product Expiration Date: 201910192359
Blood Product Expiration Date: 201910192359
Blood Product Expiration Date: 201910222359
ISSUE DATE / TIME: 201909270147
ISSUE DATE / TIME: 201909270147
ISSUE DATE / TIME: 201909270828
Unit Type and Rh: 5100
Unit Type and Rh: 5100
Unit Type and Rh: 5100

## 2018-06-21 LAB — PREPARE RBC (CROSSMATCH)

## 2018-06-21 LAB — GLUCOSE, CAPILLARY: Glucose-Capillary: 174 mg/dL — ABNORMAL HIGH (ref 70–99)

## 2018-06-28 IMAGING — CR DG CHEST 2V
2 series · 2 of 2 positions shown · non-contrast
Comparison: PA and lateral chest 02/12/2018 and 02/17/2018.
Single-view of the chest 05/15/2017.

CLINICAL DATA: Onset of productive cough yesterday.

EXAM:
CHEST - 2 VIEW

[chest lat]
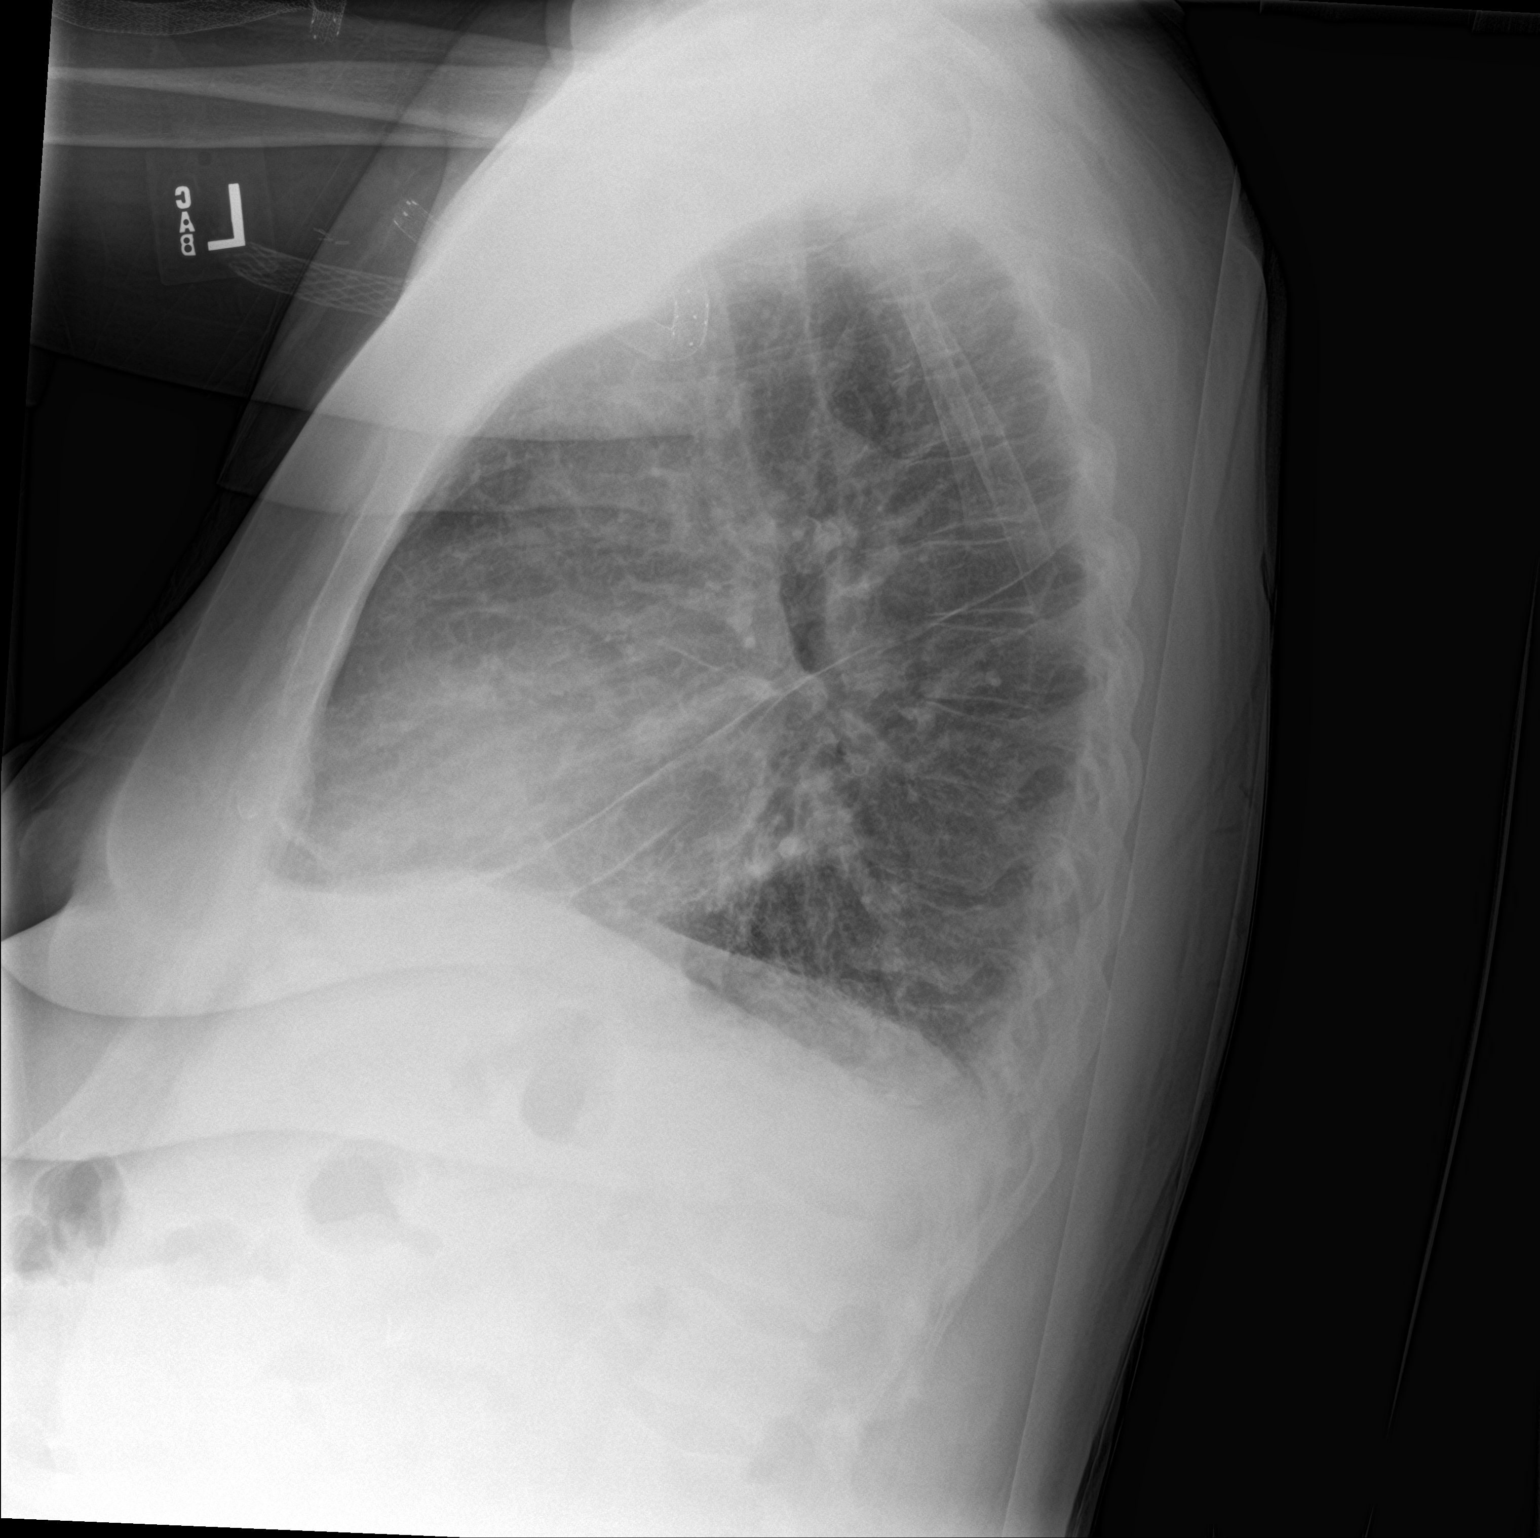

[chest ap]
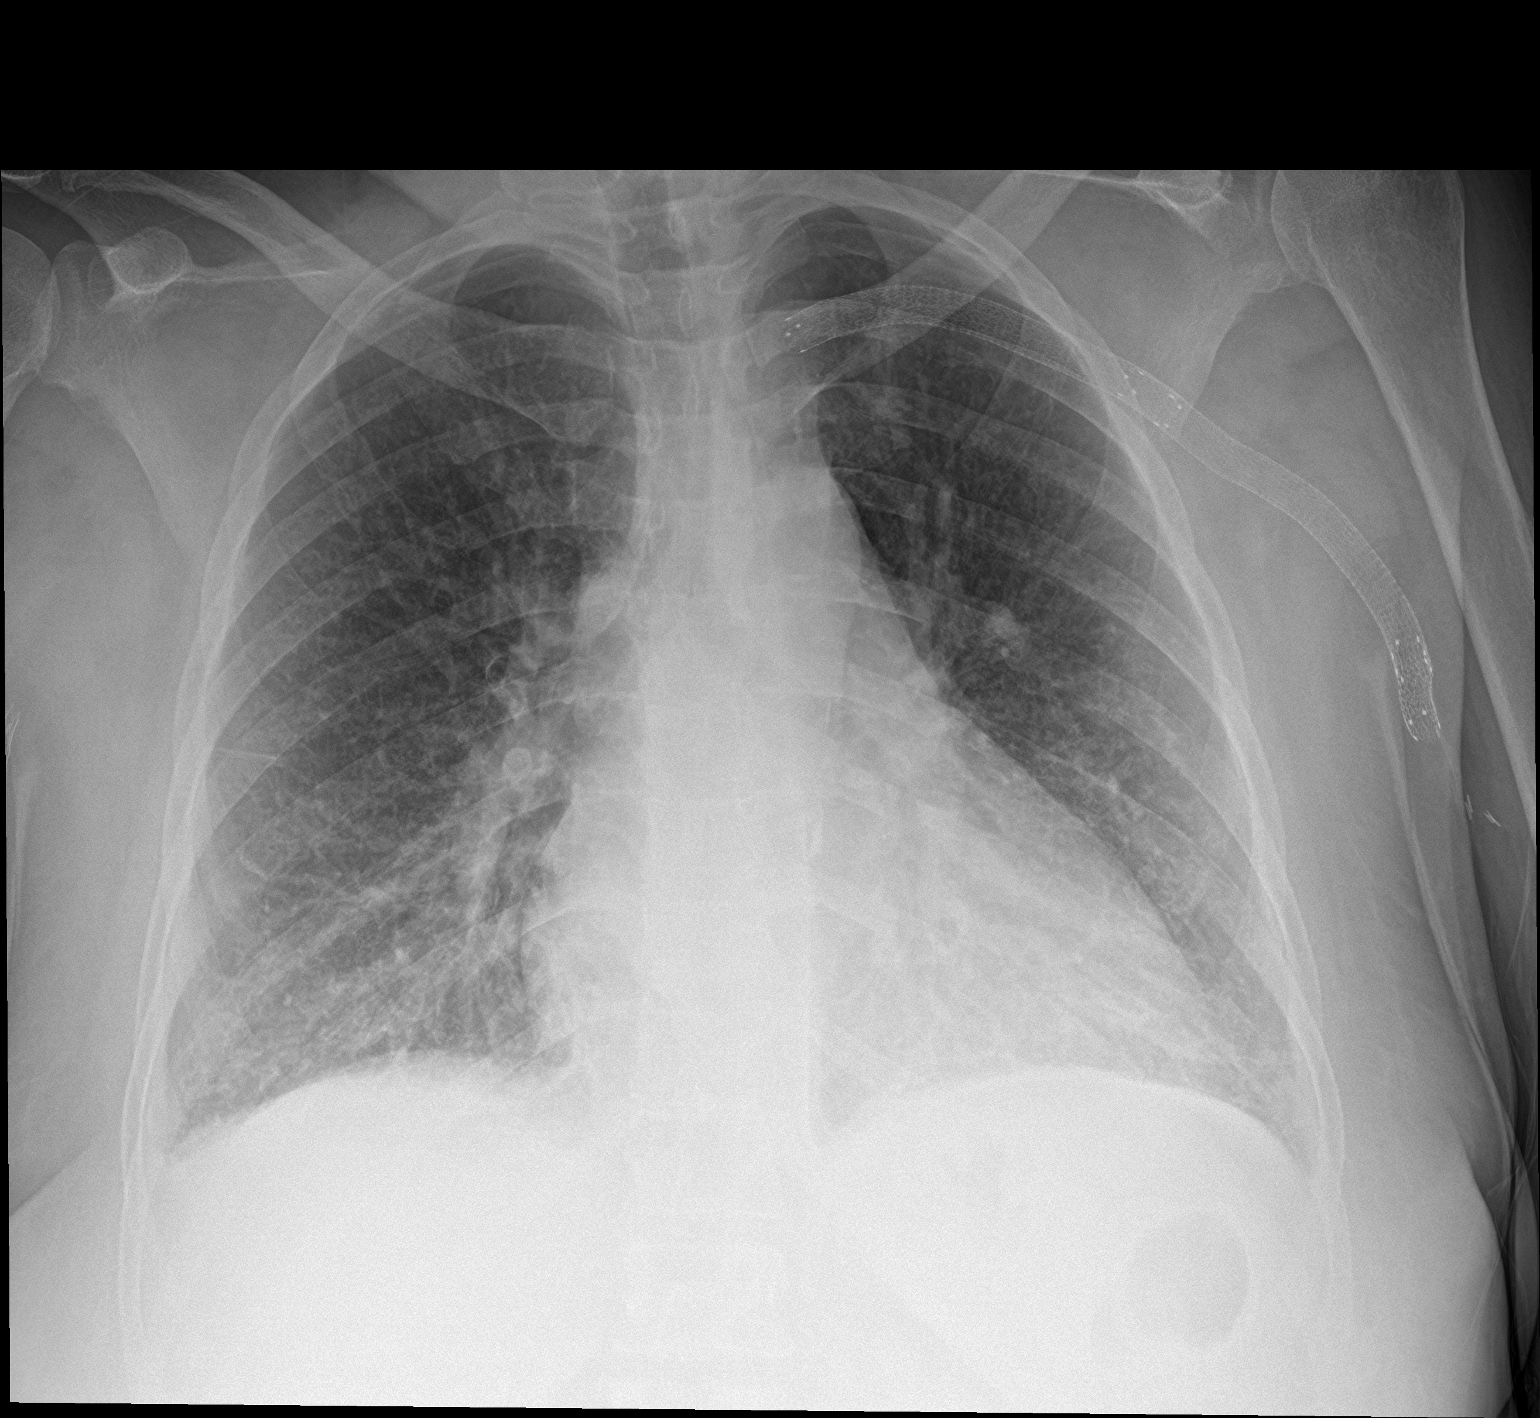

[2 of 2 positions shown; findings below may reference images not displayed]

FINDINGS: Cardiomegaly and chronic vascular congestion appear unchanged. No
consolidative process, pneumothorax or effusion. Vascular stent in
the left axilla is noted.
IMPRESSION: No acute disease.

Cardiomegaly and chronic vascular congestion.

## 2018-07-06 IMAGING — US US RENAL
1 series · 14 of 25 positions shown · non-contrast
Comparison: CT 09/03/2017

CLINICAL DATA: Right flank pain

EXAM:
RENAL / URINARY TRACT ULTRASOUND COMPLETE

[Series 1: us renal · 0.23mm/px · 14 of 31 slices shown]
[im 1/31]
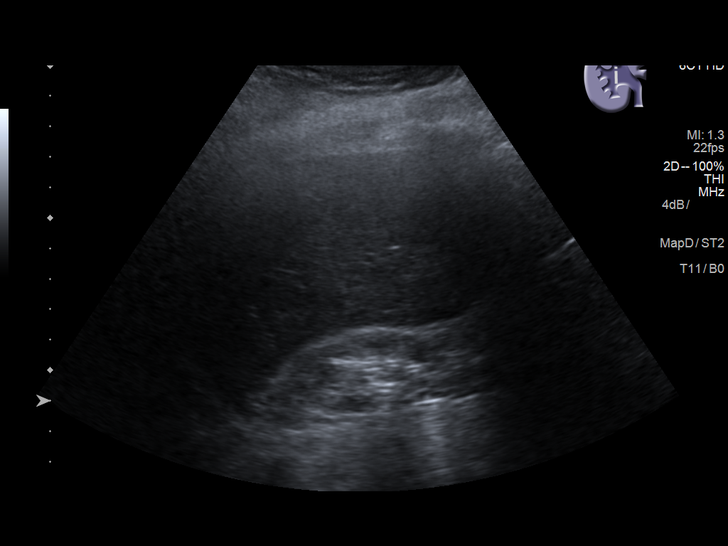
[im 3/31]
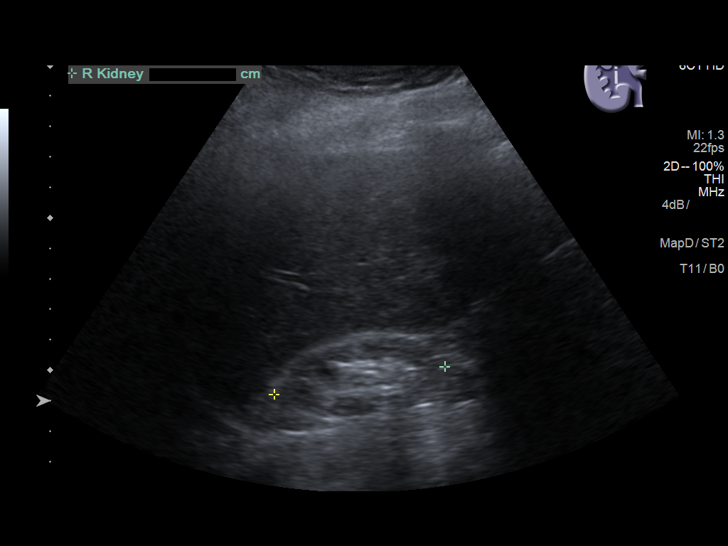
[im 6/31]
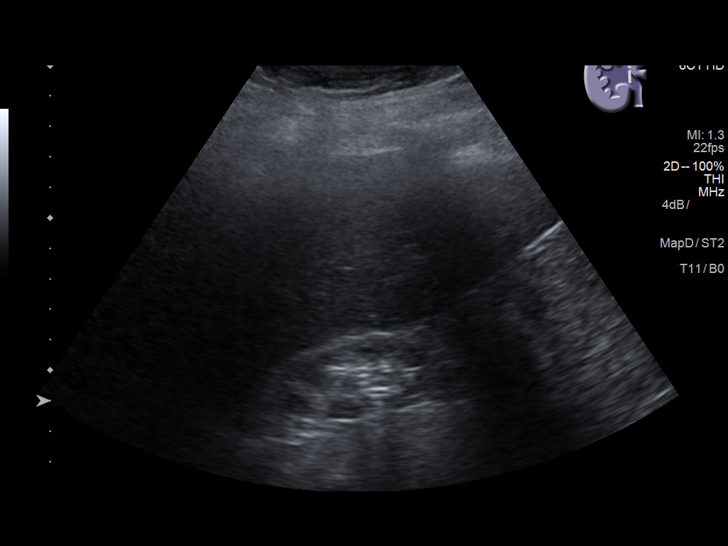
[im 8/31]
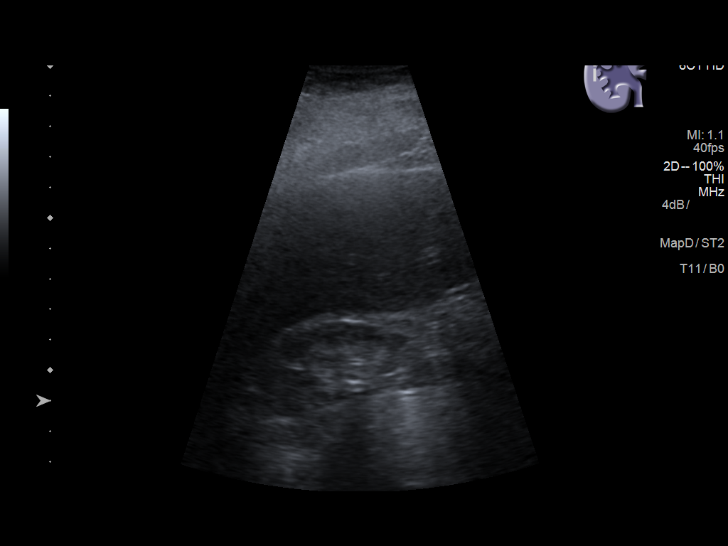
[im 11/31]
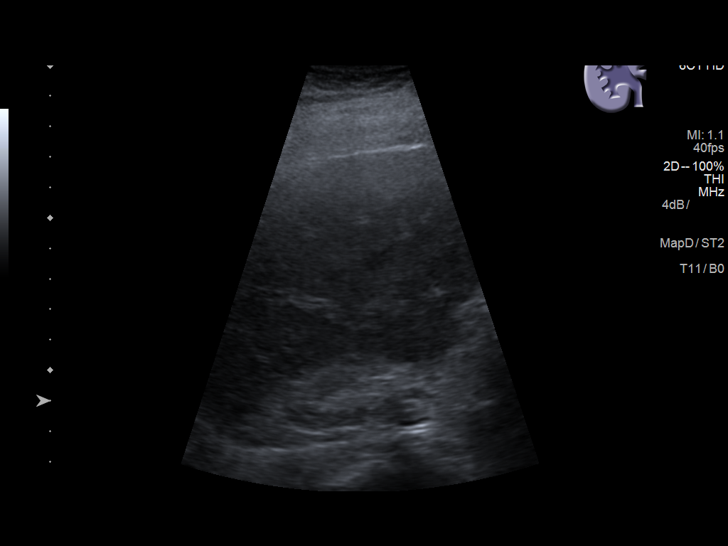
[im 12/31]
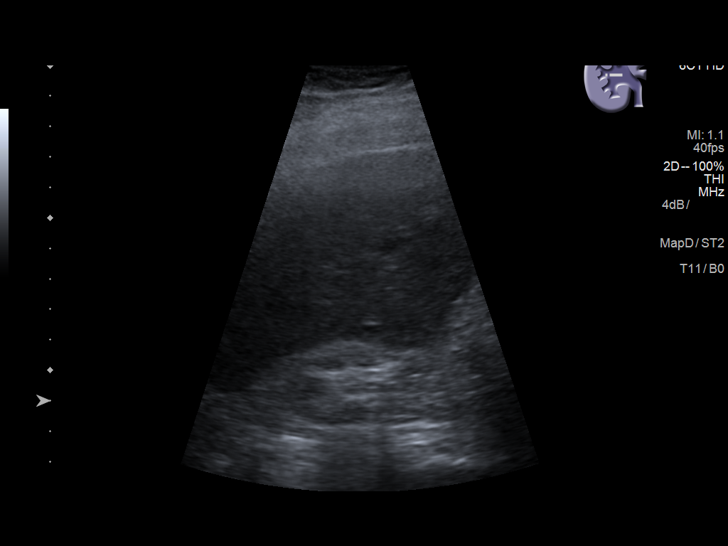
[im 14/31]
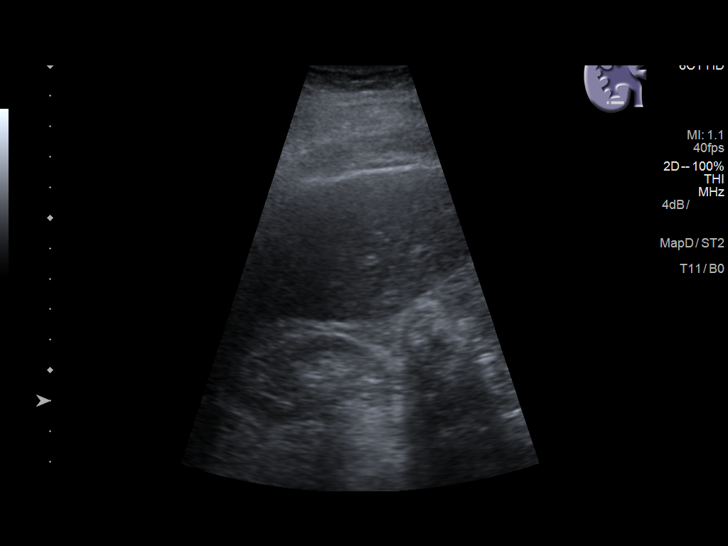
[im 17/31]
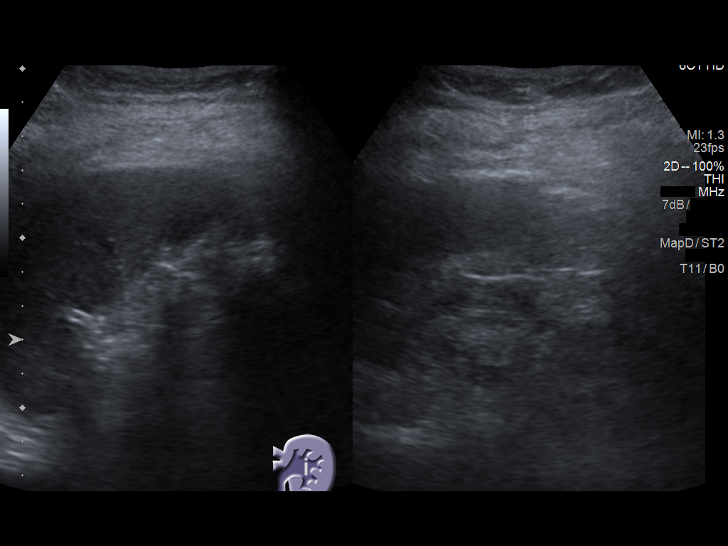
[im 19/31]
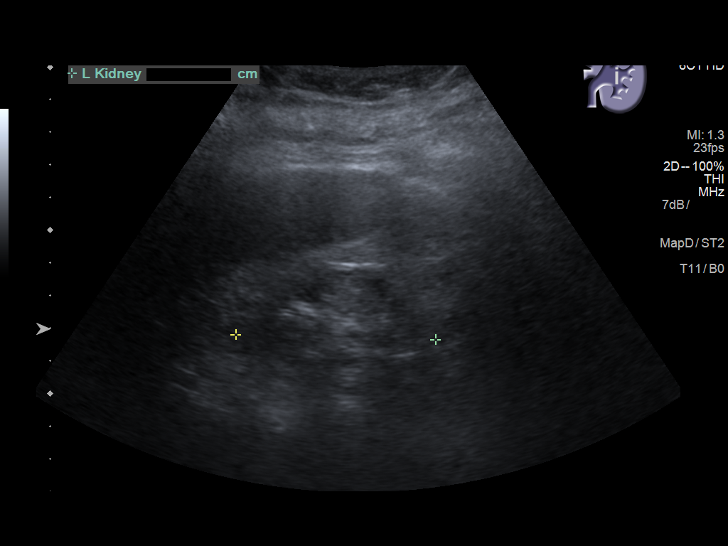
[im 21/31]
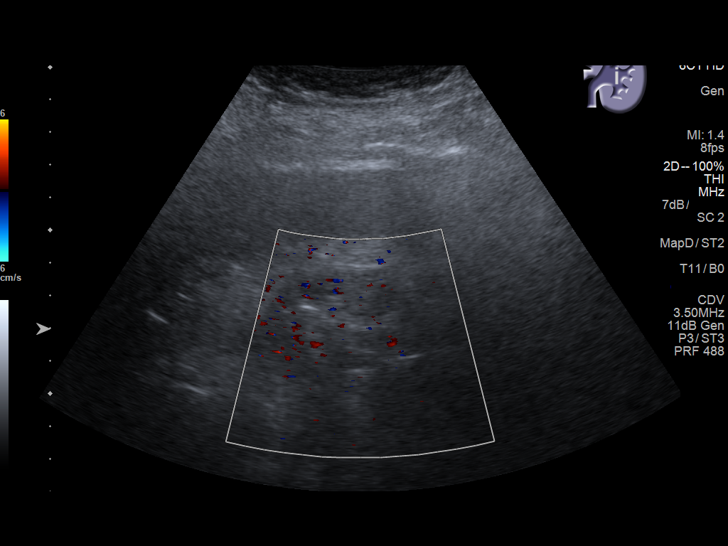
[im 23/31]
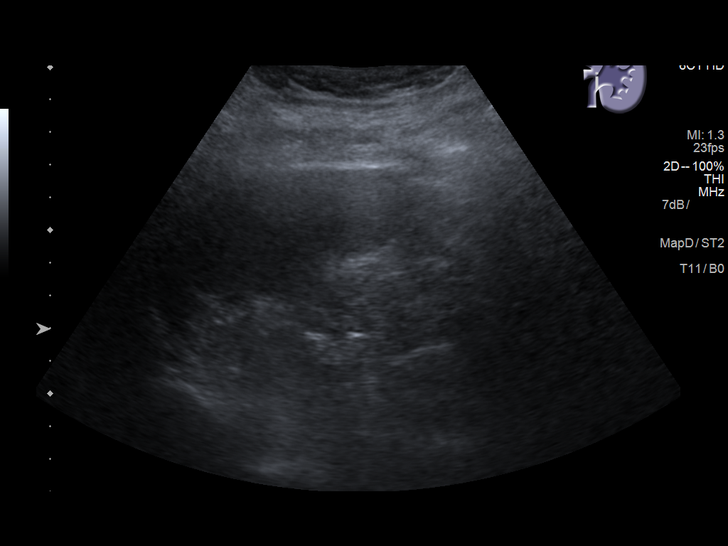
[im 26/31]
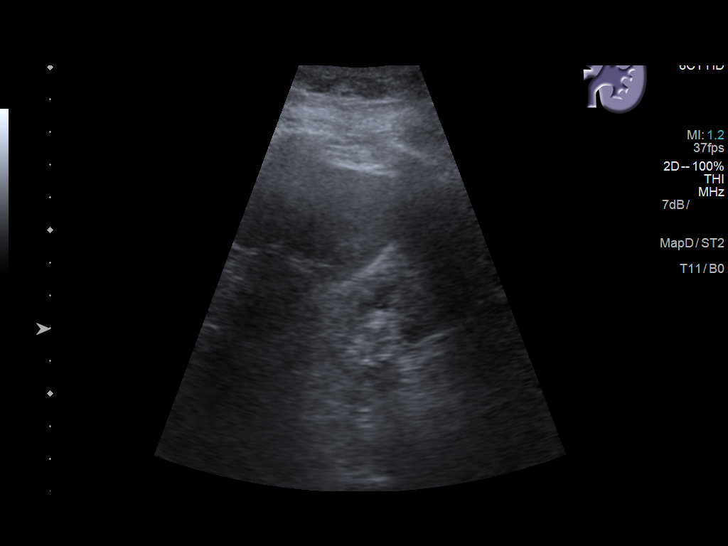
[im 28/31]
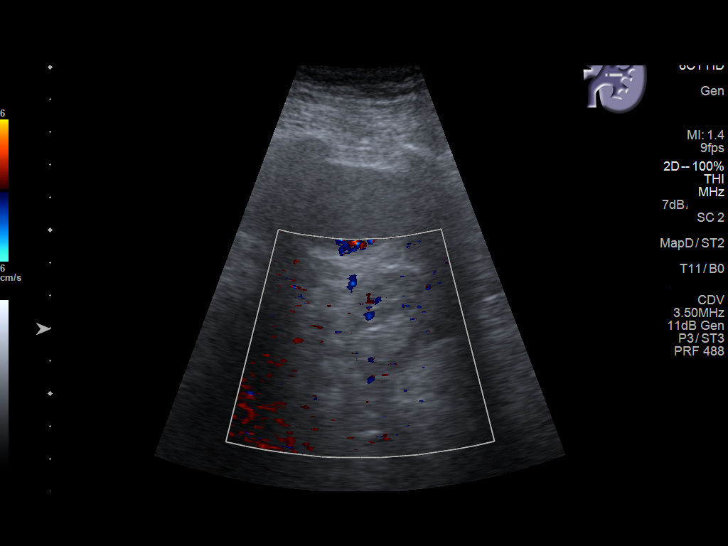
[im 31/31]
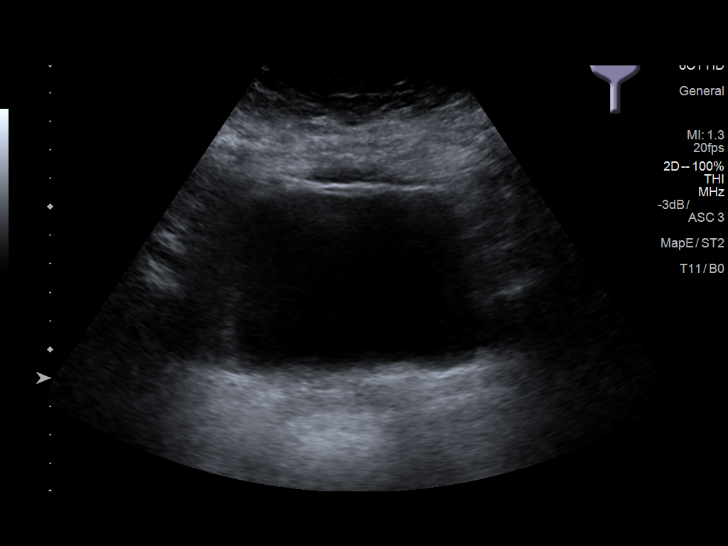

[14 of 25 positions shown; findings below may reference images not displayed]

FINDINGS: Right Kidney:

Length: Atrophic, 5.7 cm. Increased echotexture throughout the right
kidney. 8 mm echogenic focus in the region of the renal pelvis. No
hydronephrosis.

Left Kidney:

Length: Atrophic, 6.1 cm. Increased echotexture throughout the left
kidney. No mass or hydronephrosis.

Bladder:

Appears normal for degree of bladder distention.
IMPRESSION: Small, echogenic kidneys bilaterally compatible with chronic medical
renal disease.

Suspect 8 mm right renal pelvic stone.

No hydronephrosis.

## 2018-07-08 IMAGING — CT CT ABD-PEL WO/W CM
2 of 12 series · 11 of 46 positions shown, 17 images · IV contrast (iopamidol)
Comparison: 01/17/2018

ADDENDUM:
Comparison with multiple earlier CTs shows at the 6 mm hypervascular
lesion in the anterior upper pole right kidney was visible on the
11/12/2015 exam. This lesion has not significant changed since that
time, but remains suspicious for low-grade renal cell carcinoma.
This was discussed by telephone with Dr. Gunko at the time of this
addendum.
CLINICAL DATA: Followup right renal mass.

EXAM:
CT ABDOMEN AND PELVIS WITHOUT AND WITH CONTRAST
TECHNIQUE: Multidetector CT imaging of the abdomen and pelvis was performed
following the standard protocol before and following the bolus
administration of intravenous contrast.
CONTRAST:  100mL 8PG2AC-100 IOPAMIDOL (8PG2AC-100) INJECTION 61%

[Series 4: coronal without renal without · coronal · non-contrast · 0.55mm/px · 2 of 152 slices shown]
[im 51/152  soft-tissue]
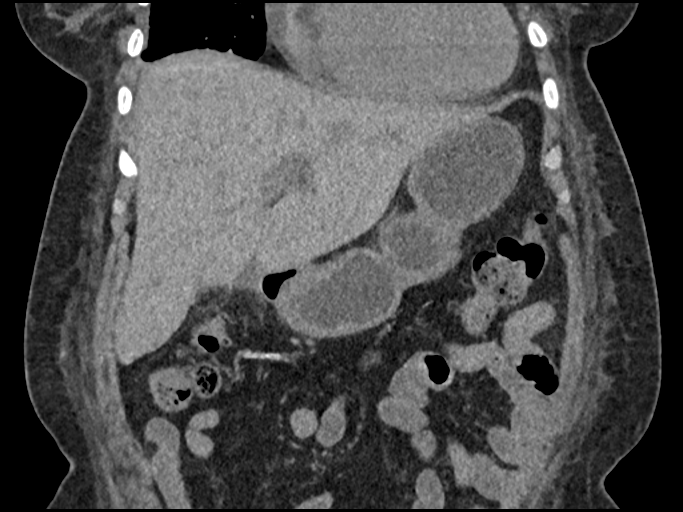
[im 101/152  soft-tissue]
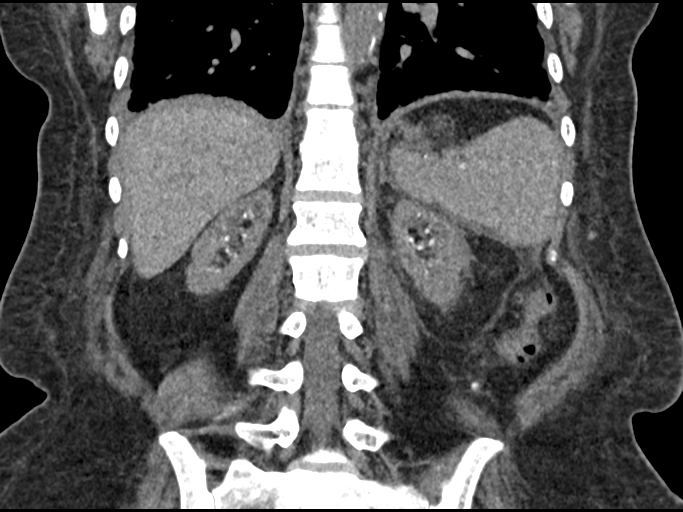

[Series 15: axial nephrographic renal nephrographic · axial · 0.73mm/px · z∈[-1523,-1187]mm · 9 of 210 slices shown, 15 images]
[im 21/210  soft-tissue]
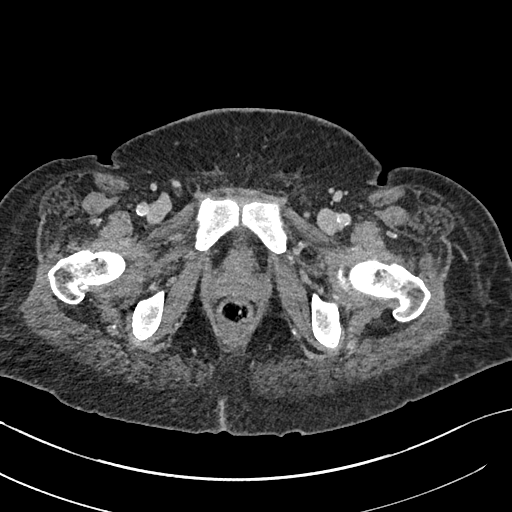
[im 21/210  bone]
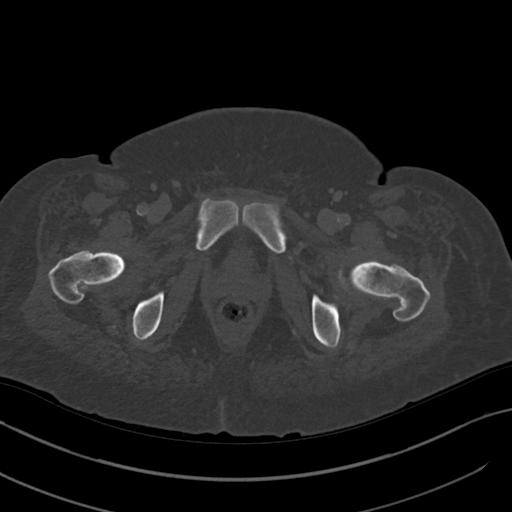
[im 42/210  soft-tissue]
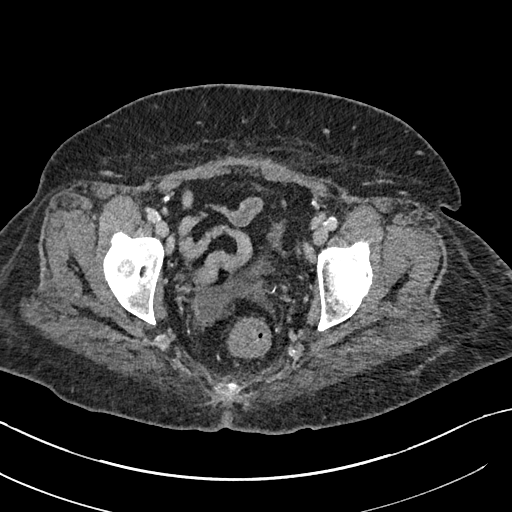
[im 63/210  soft-tissue]
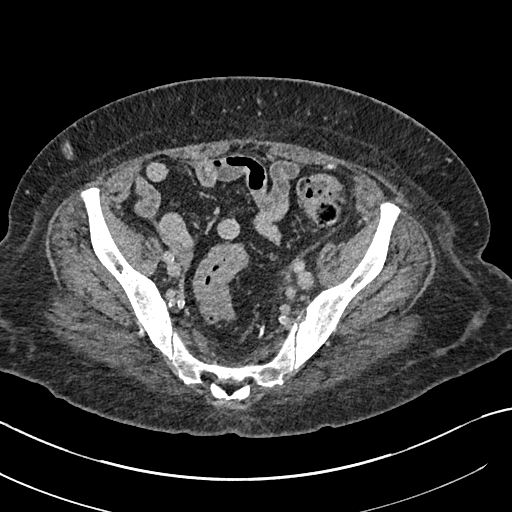
[im 84/210  soft-tissue]
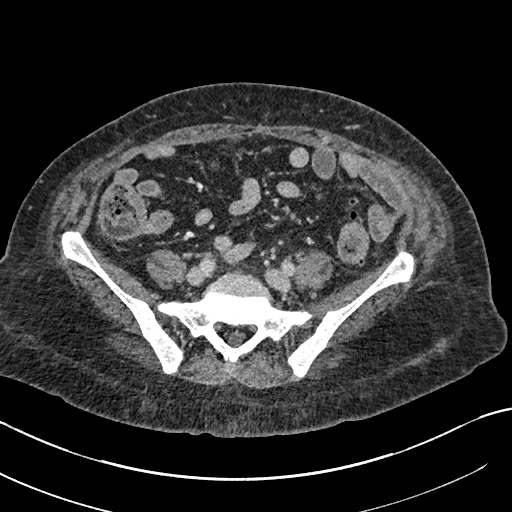
[im 105/210  soft-tissue]
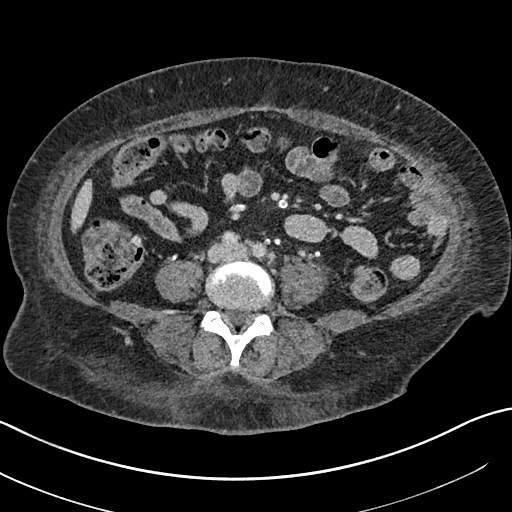
[im 126/210  soft-tissue]
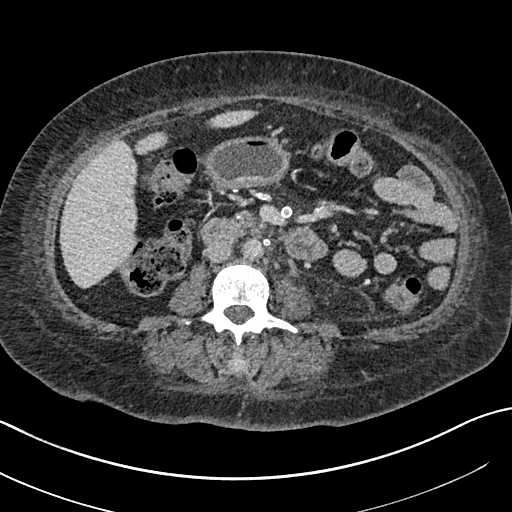
[im 126/210  lung]
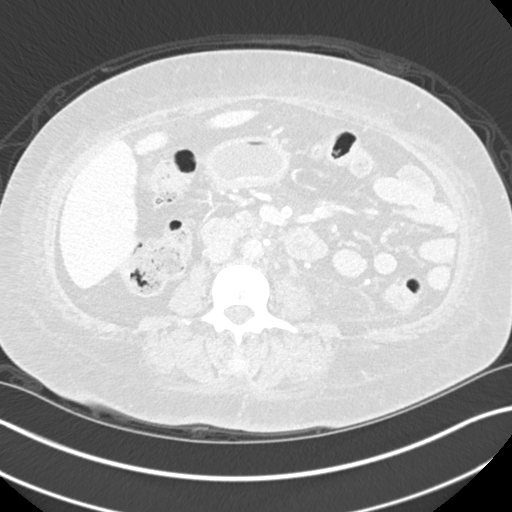
[im 147/210  soft-tissue]
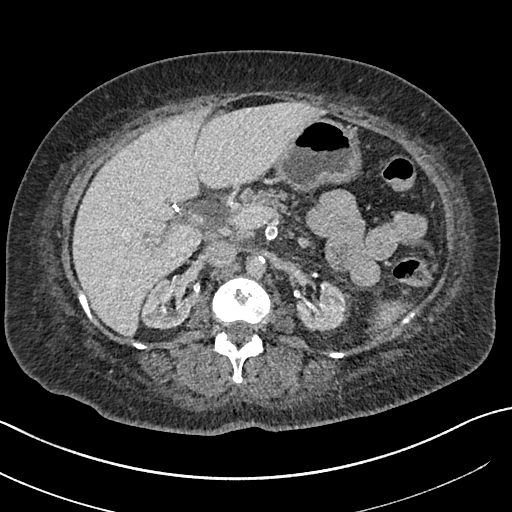
[im 147/210  lung]
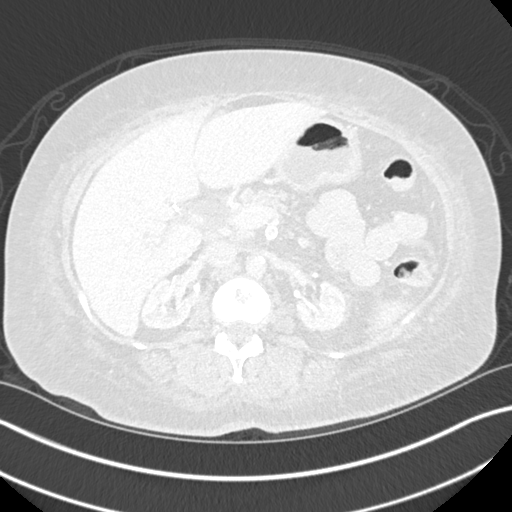
[im 168/210  soft-tissue]
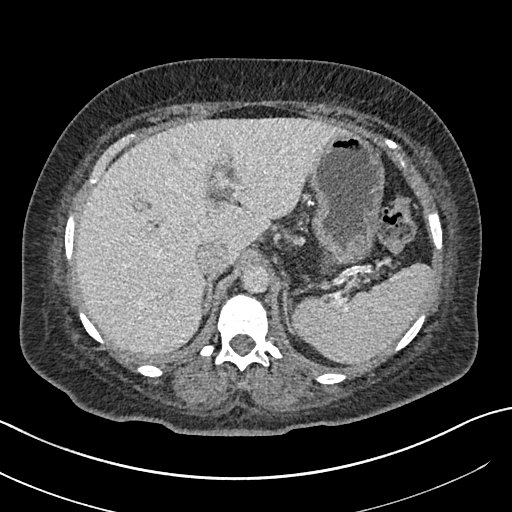
[im 168/210  lung]
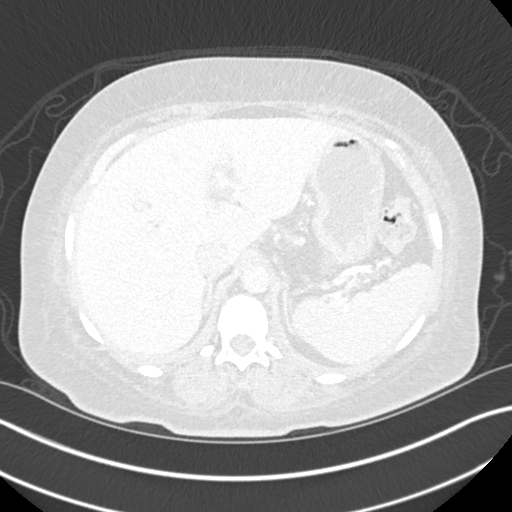
[im 189/210  soft-tissue]
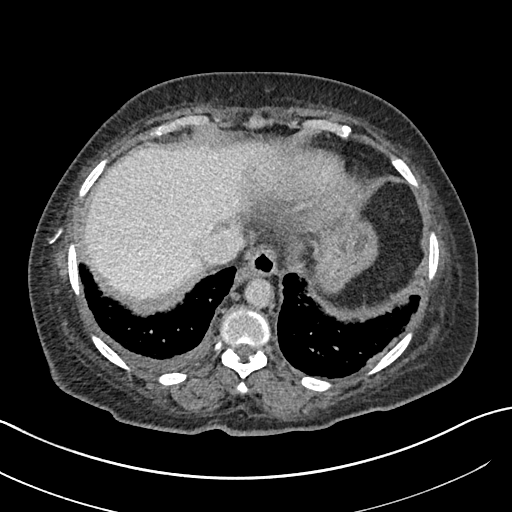
[im 189/210  lung]
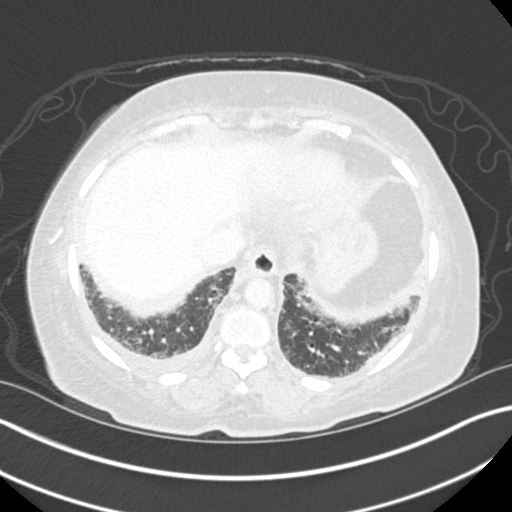
[im 189/210  bone]
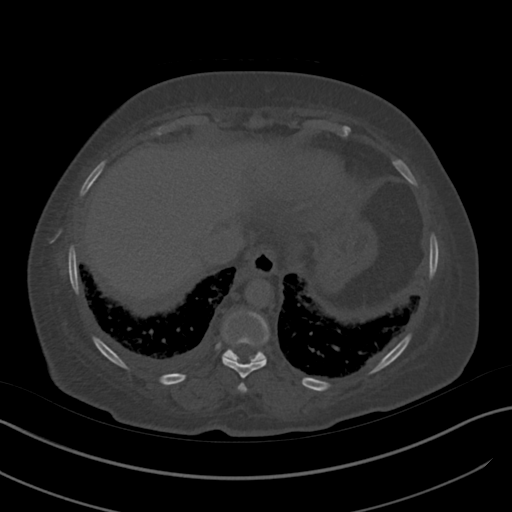

[11 of 46 positions shown; findings below may reference images not displayed]

FINDINGS: Lower Chest: No acute findings. Stable mild right pleural
thickening.

Hepatobiliary: No hepatic masses identified. Prior cholecystectomy.
Mild diffuse biliary ductal dilatation remains stable.

Pancreas:  No mass or inflammatory changes.

Spleen: Within normal limits in size and appearance.

Adrenals/Urinary Tract: Normal adrenal glands. Diffuse bilateral
renal parenchymal atrophy again seen with extensive renal vascular
calcification. No definite renal calculi are identified. No evidence
of ureteral calculi or hydronephrosis.

A 12 x 11 mm hypervascular mass in the posterior midpole of the
right kidney (image 68/series 8) is stable in size since previous
study. A 6 mm enhancing subcapsular mass is seen in the anterior
upper pole of the right kidney (image 63/series 8), which was not
seen on prior exam. Both of these are suspicious for renal cell
carcinoma. A tiny sub-cm cysts is again seen in the anterior lower
pole of the right kidney. No evidence of left renal mass.
Unremarkable urinary bladder for degree of bladder distention.

Stomach/Bowel: No evidence of obstruction, inflammatory process or
abnormal fluid collections. Colonic diverticulosis again noted,
without evidence of diverticulitis.

Vascular/Lymphatic: No pathologically enlarged lymph nodes. No
abdominal aortic aneurysm. Aortic atherosclerosis. Extensive
visceral vascular calcification also noted.

Reproductive: Prior hysterectomy noted. Adnexal regions are
unremarkable in appearance. Trace amount of free fluid noted in
pelvis.

Other:  None.

Musculoskeletal:  No suspicious bone lesions identified.
IMPRESSION: No significant change in 12 mm hypervascular mass in posterior
midpole of right kidney, suspicious for renal cell carcinoma.

New 6 mm enhancing subcapsular mass in anterior upper pole of right
kidney, also suspicious for a small renal cell carcinoma.

No evidence of metastatic disease.

Diffuse bilateral renal parenchymal atrophy.

Colonic diverticulosis, without radiographic evidence of
diverticulitis.

## 2018-07-09 ENCOUNTER — Inpatient Hospital Stay: Payer: Medicare Other

## 2018-07-09 ENCOUNTER — Inpatient Hospital Stay: Payer: Medicare Other | Admitting: Oncology

## 2018-07-19 ENCOUNTER — Other Ambulatory Visit: Payer: Self-pay

## 2018-07-19 DIAGNOSIS — R778 Other specified abnormalities of plasma proteins: Secondary | ICD-10-CM

## 2018-07-20 ENCOUNTER — Telehealth: Payer: Self-pay | Admitting: *Deleted

## 2018-07-20 ENCOUNTER — Inpatient Hospital Stay (HOSPITAL_BASED_OUTPATIENT_CLINIC_OR_DEPARTMENT_OTHER): Payer: Medicare Other | Admitting: Oncology

## 2018-07-20 ENCOUNTER — Inpatient Hospital Stay: Payer: Medicare Other | Attending: Oncology

## 2018-07-20 ENCOUNTER — Encounter: Payer: Self-pay | Admitting: Oncology

## 2018-07-20 ENCOUNTER — Inpatient Hospital Stay: Payer: Medicare Other

## 2018-07-20 ENCOUNTER — Other Ambulatory Visit: Payer: Self-pay

## 2018-07-20 ENCOUNTER — Other Ambulatory Visit: Payer: Self-pay | Admitting: *Deleted

## 2018-07-20 VITALS — BP 109/46 | HR 57 | Resp 18 | Ht 65.0 in | Wt 180.2 lb

## 2018-07-20 DIAGNOSIS — Z85528 Personal history of other malignant neoplasm of kidney: Secondary | ICD-10-CM | POA: Insufficient documentation

## 2018-07-20 DIAGNOSIS — I252 Old myocardial infarction: Secondary | ICD-10-CM

## 2018-07-20 DIAGNOSIS — L089 Local infection of the skin and subcutaneous tissue, unspecified: Secondary | ICD-10-CM | POA: Insufficient documentation

## 2018-07-20 DIAGNOSIS — E785 Hyperlipidemia, unspecified: Secondary | ICD-10-CM

## 2018-07-20 DIAGNOSIS — M545 Low back pain: Secondary | ICD-10-CM | POA: Insufficient documentation

## 2018-07-20 DIAGNOSIS — R778 Other specified abnormalities of plasma proteins: Secondary | ICD-10-CM

## 2018-07-20 DIAGNOSIS — F1210 Cannabis abuse, uncomplicated: Secondary | ICD-10-CM

## 2018-07-20 DIAGNOSIS — I5032 Chronic diastolic (congestive) heart failure: Secondary | ICD-10-CM | POA: Insufficient documentation

## 2018-07-20 DIAGNOSIS — E1140 Type 2 diabetes mellitus with diabetic neuropathy, unspecified: Secondary | ICD-10-CM | POA: Diagnosis not present

## 2018-07-20 DIAGNOSIS — R5381 Other malaise: Secondary | ICD-10-CM | POA: Diagnosis not present

## 2018-07-20 DIAGNOSIS — Z794 Long term (current) use of insulin: Secondary | ICD-10-CM

## 2018-07-20 DIAGNOSIS — Z7982 Long term (current) use of aspirin: Secondary | ICD-10-CM | POA: Insufficient documentation

## 2018-07-20 DIAGNOSIS — I2510 Atherosclerotic heart disease of native coronary artery without angina pectoris: Secondary | ICD-10-CM | POA: Insufficient documentation

## 2018-07-20 DIAGNOSIS — K210 Gastro-esophageal reflux disease with esophagitis: Secondary | ICD-10-CM | POA: Insufficient documentation

## 2018-07-20 DIAGNOSIS — M8580 Other specified disorders of bone density and structure, unspecified site: Secondary | ICD-10-CM | POA: Diagnosis not present

## 2018-07-20 DIAGNOSIS — N186 End stage renal disease: Secondary | ICD-10-CM | POA: Diagnosis not present

## 2018-07-20 DIAGNOSIS — I132 Hypertensive heart and chronic kidney disease with heart failure and with stage 5 chronic kidney disease, or end stage renal disease: Secondary | ICD-10-CM | POA: Insufficient documentation

## 2018-07-20 DIAGNOSIS — M1990 Unspecified osteoarthritis, unspecified site: Secondary | ICD-10-CM | POA: Insufficient documentation

## 2018-07-20 DIAGNOSIS — D472 Monoclonal gammopathy: Secondary | ICD-10-CM | POA: Diagnosis not present

## 2018-07-20 DIAGNOSIS — R5383 Other fatigue: Secondary | ICD-10-CM | POA: Insufficient documentation

## 2018-07-20 DIAGNOSIS — J449 Chronic obstructive pulmonary disease, unspecified: Secondary | ICD-10-CM

## 2018-07-20 DIAGNOSIS — D631 Anemia in chronic kidney disease: Secondary | ICD-10-CM | POA: Diagnosis not present

## 2018-07-20 DIAGNOSIS — Z79899 Other long term (current) drug therapy: Secondary | ICD-10-CM

## 2018-07-20 DIAGNOSIS — Z992 Dependence on renal dialysis: Secondary | ICD-10-CM | POA: Diagnosis not present

## 2018-07-20 DIAGNOSIS — E1122 Type 2 diabetes mellitus with diabetic chronic kidney disease: Secondary | ICD-10-CM

## 2018-07-20 DIAGNOSIS — Z87891 Personal history of nicotine dependence: Secondary | ICD-10-CM | POA: Insufficient documentation

## 2018-07-20 DIAGNOSIS — N189 Chronic kidney disease, unspecified: Principal | ICD-10-CM

## 2018-07-20 LAB — CBC WITH DIFFERENTIAL/PLATELET
Abs Immature Granulocytes: 0.02 10*3/uL (ref 0.00–0.07)
Basophils Absolute: 0 10*3/uL (ref 0.0–0.1)
Basophils Relative: 0 %
Eosinophils Absolute: 0.4 10*3/uL (ref 0.0–0.5)
Eosinophils Relative: 5 %
HCT: 28.7 % — ABNORMAL LOW (ref 36.0–46.0)
Hemoglobin: 8.8 g/dL — ABNORMAL LOW (ref 12.0–15.0)
Immature Granulocytes: 0 %
Lymphocytes Relative: 16 %
Lymphs Abs: 1.2 10*3/uL (ref 0.7–4.0)
MCH: 29.9 pg (ref 26.0–34.0)
MCHC: 30.7 g/dL (ref 30.0–36.0)
MCV: 97.6 fL (ref 80.0–100.0)
Monocytes Absolute: 0.9 10*3/uL (ref 0.1–1.0)
Monocytes Relative: 12 %
Neutro Abs: 4.9 10*3/uL (ref 1.7–7.7)
Neutrophils Relative %: 67 %
Platelets: 167 10*3/uL (ref 150–400)
RBC: 2.94 MIL/uL — ABNORMAL LOW (ref 3.87–5.11)
RDW: 15.9 % — ABNORMAL HIGH (ref 11.5–15.5)
WBC: 7.5 10*3/uL (ref 4.0–10.5)
nRBC: 0 % (ref 0.0–0.2)

## 2018-07-20 LAB — SAMPLE TO BLOOD BANK

## 2018-07-20 MED ORDER — DOXYCYCLINE HYCLATE 100 MG PO TABS: 100.0000 mg | ORAL_TABLET | Freq: Two times a day (BID) | ORAL | 0 refills | Status: DC

## 2018-07-20 NOTE — Progress Notes (Signed)
Hematology/Oncology Consult note [facility]  [phone] [phone]  Patient Care Team: Freddy Finner, NP as PCP - General (Nurse Practitioner) Lavonia Dana, MD as Consulting Physician (Internal Medicine)   Name of the patient: [name]  [id number]  [birth year]   Date of visit: 07/20/18  Diagnosis- 1. Anemia of chronic kidney disease  2. MGUS  Chief complaint/ Reason for visit- routine f/u of anemia  Heme/Onc history: patient is a 57 year old female with a past medical history significant for coronary artery disease, type 2 diabetes with neuropathy, hypertension hyperlipidemia and osteopenia arthritis. She has ESRD secondary to HTN/DM and has been on dialysis for about 14 years. She was seen by rheumatology for complaints of low back pain. She also had painful subcutaneous skin rashes and was seen by dermatology and biopsy of one of the lesions showed mild septal panicky like this. As a part of workup by rheumatology doctor Dr. Lezlie Lye- Meda Coffee she had SPEP done which came back abnormal with the presence of M spike and hence has been referred to [place]. There was still some ongoing concern about possible lupus and she follows up with rheumatology. Her last colonoscopy was in November 2017 which showed tubular adenoma and a repeat colonoscopy was recommended in 5 years.  CBC from 01/08/2017 showed white count of 6.7, H&H of 13.9/42.8 and a platelet count of 161. CMP showed elevated BUN of 43 and creatinine of 5.22. Calcium was normal at 9.7. Total protein was mildly elevated at 8.6. An albumin was normal at 3.9. SPEP from 11/06/2016 did not reveal any M spike. M spike of 4.2% was noted on 24-hour urine protein.A lambda free light chain ratio was normal although both And lambda were elevated due to chronic kidney disease.she gets epo through dialysis. She also has kidney mass suspicious for RCC being followed by Dr. Erlene Quan  Interval history-she  feels fatigued.  Reports pain in her right flank and lower back.  Denies any burning during urination.  Denies any fever.  She has developed a cluster of pustules over her bilateral arms as well as the need for neck over the last 2 days.  ECOG PS- 2 Pain scale- 0 Opioid associated constipation- no  Review of systems- Review of Systems  Constitutional: Positive for malaise/fatigue. Negative for chills, fever and weight loss.  HENT: Negative for congestion, ear discharge and nosebleeds.   Eyes: Negative for blurred vision.  Respiratory: Negative for cough, hemoptysis, sputum production, shortness of breath and wheezing.   Cardiovascular: Negative for chest pain, palpitations, orthopnea and claudication.  Gastrointestinal: Negative for abdominal pain, blood in stool, constipation, diarrhea, heartburn, melena, nausea and vomiting.  Genitourinary: Negative for dysuria, flank pain, frequency, hematuria and urgency.  Musculoskeletal: Positive for back pain. Negative for joint pain and myalgias.  Skin: Positive for rash.  Neurological: Negative for dizziness, tingling, focal weakness, seizures, weakness and headaches.  Endo/Heme/Allergies: Does not bruise/bleed easily.  Psychiatric/Behavioral: Negative for depression and suicidal ideas. The patient does not have insomnia.       Allergies  Allergen Reactions  . Ace Inhibitors Swelling and Anaphylaxis  . Ativan [Lorazepam] Other (See Comments)    Reaction:Hallucinations and headaches  . Compazine [Prochlorperazine Edisylate] Anaphylaxis, Nausea And Vomiting and Other (See Comments)    Other reaction(s): dystonia from this vs. Reglan, 23 Jul - patient relates that she takes promethazine frequently with no problems  . Sumatriptan Succinate Other (See Comments)    Other reaction(s): delirium and hallucinations per [facility]  records  . Dilaudid [Hydromorphone Hcl] Other (See Comments)    Delirium   . Ondansetron Other (See Comments)     hallucinations    . Zofran [Ondansetron Hcl] Other (See Comments)    Reaction:  hallucinations   . Codeine Nausea And Vomiting  . Gabapentin Other (See Comments)    Reaction:  Unknown    . Lac Bovis Nausea And Vomiting  . Losartan Nausea Only  . Oxycodone Anxiety  . Prochlorperazine Other (See Comments)    Reaction:  Unknown . Patient does not remember reaction but she does have vertigo and anxiety along with n and v at times. Could be used to treat any of these   . Reglan [Metoclopramide] Other (See Comments)    Per patient her Dr. Evelina Bucy her off it   . Scopolamine Other (See Comments)    Dizziness, also has vertigo already  . Tape Rash  . Tapentadol Rash     Past Medical History:  Diagnosis Date  . Anemia   . Anginal pain ([place])   . Anxiety   . Arthritis   . Asthma   . Broken wrist   . Bronchitis   . chronic diastolic CHF 6/44/0347  . COPD (chronic obstructive pulmonary disease) ([place])   . Coronary artery disease    a. cath 2013: stenting to RCA (report not available); b. cath 2014: LM nl, pLAD 40%, mLAD nl, ost LCx 40%, mid LCx nl, pRCA 30% @ site of prior stent, mRCA 50%  . Depression   . Diabetes mellitus without complication ([place])   . Diabetic neuropathy ([place])   . dialysis 2006  . Diverticulosis   . Dizziness   . Dyspnea   . Elevated lipids   . Environmental and seasonal allergies   . ESRD (end stage renal disease) on dialysis ([place])    M-W-F  . GERD (gastroesophageal reflux disease)   . Headache   . History of hiatal hernia   . HOH (hard of hearing)   . Hx of pancreatitis 2015  . Hypertension   . Lower extremity edema   . Mitral regurgitation    a. echo 10/2013: EF 62%, noWMA, mildly dilated LA, mild to mod MR/TR, GR1DD  . Myocardial infarction ([place])   . Orthopnea   . Pneumonia   . Renal cancer ([place])   . Renal insufficiency    Pt is on dialysis on M,W + F.  . Wheezing      Past Surgical History:  Procedure Laterality Date  . A/V SHUNTOGRAM Left  01/20/2018   Procedure: A/V SHUNTOGRAM;  Surgeon: Algernon Huxley, MD;  Location: [facility] CV LAB;  Service: Cardiovascular;  Laterality: Left;  . ABDOMINAL HYSTERECTOMY  1992  . AMPUTATION TOE Left 10/02/2017   Procedure: AMPUTATION TOE-LEFT GREAT TOE;  Surgeon: Albertine Patricia, DPM;  Location: [facility] ORS;  Service: Podiatry;  Laterality: Left;  . APPENDECTOMY    . CARDIAC CATHETERIZATION Left 07/26/2015   Procedure: Left Heart Cath and Coronary Angiography;  Surgeon: Dionisio David, MD;  Location: [place] CV LAB;  Service: Cardiovascular;  Laterality: Left;  . CATARACT EXTRACTION W/ INTRAOCULAR LENS IMPLANT Right   . CATARACT EXTRACTION W/PHACO Left 03/10/2017   Procedure: CATARACT EXTRACTION PHACO AND INTRAOCULAR LENS PLACEMENT (IOC);  Surgeon: Birder Robson, MD;  Location: [facility] ORS;  Service: Ophthalmology;  Laterality: Left;  [place] 00:51.9 AP% 14.2 CDE 7.39 fluid pack lot # [id number]  . CHOLECYSTECTOMY    . COLONOSCOPY WITH PROPOFOL N/A 08/12/2016  Procedure: COLONOSCOPY WITH PROPOFOL;  Surgeon: Lollie Sails, MD;  Location: [facility] ENDOSCOPY;  Service: Endoscopy;  Laterality: N/A;  . DIALYSIS FISTULA CREATION Left    upper arm  . dialysis grafts    . ESOPHAGOGASTRODUODENOSCOPY N/A 03/08/2015   Procedure: ESOPHAGOGASTRODUODENOSCOPY (EGD);  Surgeon: Manya Silvas, MD;  Location: [facility] ENDOSCOPY;  Service: Endoscopy;  Laterality: N/A;  . ESOPHAGOGASTRODUODENOSCOPY (EGD) WITH PROPOFOL N/A 03/18/2016   Procedure: ESOPHAGOGASTRODUODENOSCOPY (EGD) WITH PROPOFOL;  Surgeon: [name], MD;  Location: [facility] ENDOSCOPY;  Service: Endoscopy;  Laterality: N/A;  . EYE SURGERY Right 2018  . FECAL TRANSPLANT N/A 08/23/2015   Procedure: FECAL TRANSPLANT;  Surgeon: Manya Silvas, MD;  Location: [facility] ENDOSCOPY;  Service: Endoscopy;  Laterality: N/A;  . HAND SURGERY Bilateral   . PERIPHERAL VASCULAR CATHETERIZATION N/A 12/20/2015   Procedure: Thrombectomy of dialysis access versus permcath  placement;  Surgeon: Algernon Huxley, MD;  Location: [place] [facility];  Service: Cardiovascular;  Laterality: N/A;  . PERIPHERAL VASCULAR CATHETERIZATION N/A 12/20/2015   Procedure: A/V Shunt Intervention;  Surgeon: Algernon Huxley, MD;  Location: [place] CV LAB;  Service: Cardiovascular;  Laterality: N/A;  . PERIPHERAL VASCULAR CATHETERIZATION N/A 12/20/2015   Procedure: A/V Shuntogram/Fistulagram;  Surgeon: Algernon Huxley, MD;  Location: [place] CV LAB;  Service: Cardiovascular;  Laterality: N/A;  . PERIPHERAL VASCULAR CATHETERIZATION N/A 01/02/2016   Procedure: A/V Shuntogram/Fistulagram;  Surgeon: Algernon Huxley, MD;  Location: [place] CV LAB;  Service: Cardiovascular;  Laterality: N/A;  . PERIPHERAL VASCULAR CATHETERIZATION N/A 01/02/2016   Procedure: A/V Shunt Intervention;  Surgeon: Algernon Huxley, MD;  Location: [place] CV LAB;  Service: Cardiovascular;  Laterality: N/A;    Social History   Socioeconomic History  . Marital status: Divorced    Spouse name: Not on file  . Number of children: Not on file  . Years of education: Not on file  . Highest education level: Not on file  Occupational History  . Not on file  Social Needs  . Financial resource strain: Not hard at all  . Food insecurity:    Worry: Never true    Inability: Never true  . Transportation needs:    Medical: No    Non-medical: No  Tobacco Use  . Smoking status: Former Smoker    Packs/day: 0.50    Years: 40.00    Pack years: 20.00    Types: Cigarettes    Last attempt to quit: 02/13/2015    Years since quitting: 3.4  . Smokeless tobacco: Never Used  Substance and Sexual Activity  . Alcohol use: Not Currently    Comment: glass wine week per pt  . Drug use: Yes    Types: Marijuana    Comment: once a day  . Sexual activity: Never  Lifestyle  . Physical activity:    Days per week: 7 days    Minutes per session: 10 min  . Stress: Very much  Relationships  . Social connections:    Talks on phone:  More than three times a week    Gets together: More than three times a week    Attends religious service: More than 4 times per year    Active member of club or organization: Yes    Attends meetings of clubs or organizations: Never    Relationship status: Divorced  . Intimate partner violence:    Fear of current or ex partner: No    Emotionally abused: No    Physically abused:  No    Forced sexual activity: No  Other Topics Concern  . Not on file  Social History Narrative  . Not on file    Family History  Problem Relation Age of Onset  . Kidney disease Mother   . Diabetes Mother   . Cancer Father   . Kidney disease Sister      Current Outpatient Medications:  .  albuterol (PROVENTIL HFA;VENTOLIN HFA) 108 (90 Base) MCG/ACT inhaler, Inhale 2 puffs into the lungs every 6 (six) hours as needed for wheezing or shortness of breath., Disp: 1 Inhaler, Rfl: 2 .  albuterol (PROVENTIL) (2.5 MG/3ML) 0.083% nebulizer solution, Take 2.5 mg by nebulization every 4 (four) hours as needed for wheezing or shortness of breath., Disp: , Rfl:  .  amLODipine (NORVASC) 10 MG tablet, Take 10 mg by mouth daily., Disp: , Rfl:  .  aspirin EC 81 MG tablet, Take 81 mg by mouth daily., Disp: , Rfl:  .  atorvastatin (LIPITOR) 20 MG tablet, Take 20 mg by mouth every evening. , Disp: , Rfl:  .  Biotin 1 MG CAPS, Take 1 mg by mouth daily., Disp: , Rfl:  .  cetirizine (ZYRTEC) 10 MG tablet, Take 10 mg by mouth daily. , Disp: , Rfl:  .  cholecalciferol (VITAMIN D) 400 units TABS tablet, Take 400 Units by mouth daily., Disp: , Rfl:  .  cyanocobalamin 1000 MCG tablet, Take 1,000 mcg by mouth daily. , Disp: , Rfl:  .  diclofenac sodium (VOLTAREN) 1 % GEL, Apply 1 application topically daily as needed (for rash)., Disp: , Rfl:  .  FLUoxetine (PROZAC) 20 MG capsule, Take 1 capsule (20 mg total) by mouth daily. (Patient taking differently: Take 20 mg by mouth at bedtime. ), Disp: 30 capsule, Rfl: 0 .  hydrocortisone  cream 0.5 %, Apply topically 3 (three) times daily. (Patient not taking: Reported on 06/17/2018), Disp: 30 g, Rfl: 0 .  insulin aspart (NOVOLOG FLEXPEN) 100 UNIT/ML FlexPen, Inject 4-15 Units into the skin 4 (four) times daily as needed for high blood sugar (> 100, per sliding scale)., Disp: , Rfl:  .  lidocaine (LIDODERM) 5 %, Place 1 patch onto the skin every 12 (twelve) hours. Remove & Discard patch within 12 hours or as directed by MD, Disp: 10 patch, Rfl: 0 .  lipase/protease/amylase (CREON) 12000 UNITS CPEP capsule, Take 36000 mg in the morning, 24000 midday & 36000 in the evening, Disp: , Rfl:  .  multivitamin (RENA-VIT) TABS tablet, Take 1 tablet by mouth daily. , Disp: , Rfl:  .  nitroGLYCERIN (NITROSTAT) 0.4 MG SL tablet, Place 0.4 mg under the tongue every 5 (five) minutes as needed for chest pain. , Disp: , Rfl:  .  ranitidine (ZANTAC) 300 MG capsule, Take 1 capsule by mouth every evening., Disp: , Rfl: 1 .  rifaximin (XIFAXAN) 550 MG TABS tablet, Take 550 mg by mouth 2 (two) times daily., Disp: , Rfl:  .  sevelamer carbonate (RENVELA) 800 MG tablet, Take 800-2,400 mg by mouth See admin instructions. Take 2400 mg by mouth 3 times daily with meals and take 800 mg by mouth with snacks, Disp: , Rfl:   Physical exam:  Vitals:   07/20/18 1027  BP: (!) 109/46  Pulse: (!) 57  Resp: 18  TempSrc: Tympanic  SpO2: 97%  Weight: 180 lb 3.2 oz (81.7 kg)  Height: 5\' 5"  (1.651 m)   Physical Exam  Constitutional: She is oriented to person, place, and time.  She appears fatigued and is sitting in a wheelchair.  HENT:  Head: Normocephalic and atraumatic.  Eyes: Pupils are equal, round, and reactive to light. EOM are normal.  Neck: Normal range of motion.  Cardiovascular: Normal rate, regular rhythm and normal heart sounds.  Pulmonary/Chest: Effort normal and breath sounds normal.  Abdominal: Soft. Bowel sounds are normal.  Neurological: She is alert and oriented to person, place, and time.    Skin: Skin is warm and dry.  Scattered cluster of pustular lesions with fluid-filled blisters seen over bilateral arms over the shoulders as well as nape of the neck     CMP Latest Ref Rng & Units 06/19/2018  Glucose 70 - 99 mg/dL 183(H)  BUN 6 - 20 mg/dL 45(H)  Creatinine 0.44 - 1.00 mg/dL 6.34(H)  Sodium 135 - 145 mmol/L 133(L)  Potassium 3.5 - 5.1 mmol/L 5.1  Chloride 98 - 111 mmol/L 95(L)  CO2 22 - 32 mmol/L 27  Calcium 8.9 - 10.3 mg/dL 8.3(L)  Total Protein 6.5 - 8.1 g/dL -  Total Bilirubin 0.3 - 1.2 mg/dL -  Alkaline Phos 38 - 126 U/L -  AST 15 - 41 U/L -  ALT 0 - 44 U/L -   CBC Latest Ref Rng & Units 07/20/2018  WBC 4.0 - 10.5 K/uL 7.5  Hemoglobin 12.0 - 15.0 g/dL 8.8(L)  Hematocrit 36.0 - 46.0 % 28.7(L)  Platelets 150 - 400 K/uL 167     Assessment and plan- Patient is a 57 y.o. female with following issues:  1. Normocytic anemia: secondary to anemia of CKD.  Anemia work-up including iron studies TSH B12 and folate were unremarkable.  She has a normal free light chain ratio and no M spike in the serum.  She has been unable to give [place] 24-hour urine sample in the past.  She has restarted E Po with nephrology and her hemoglobin is improved to 8.8 today.  No need for blood transfusion today.  I will continue to see how she responds to people as her hemoglobin was close to 9-10 in the past.  Repeat CBC with differential in 1 month and 2 months and I will see her back in 2 months.  2.  Pustular lesions over nape of the neck and bilateral arms.  Etiology is unclear.  Clinically this does not look herpetic.  We have obtained a swab from 1 of these lesions and sent for cultures.  I will empirically start her on doxycycline 100 mg twice daily for 7 days.  Doxycycline but does not require dose adjustment with dialysis.  I did explain to her the possible side effects of doxycycline including all but not limited to photosensitivity and cell esophagitis.  Patient understands and agrees to  proceed.  If her lesions worsen or do not improve after 3 to 4 days of doxycycline she should get in touch with her primary care doctor review.  She has an appointment with her about 2 weeks from now but I have asked her to call them and expedite her appointment     Visit Diagnosis 1. Anemia of chronic kidney failure, unspecified stage   2. Pustule      Dr. Randa Evens, MD, MPH [facility] [phone] 07/20/2018 12:44 PM

## 2018-07-20 NOTE — Progress Notes (Signed)
No new changes noted today 

## 2018-07-20 NOTE — Telephone Encounter (Signed)
Called Dr. Sherri Sear office at [facility].  Told secretary that patient had pustules on both arms shoulders back and neck and hair.  Got a wound culture from 1 of the places and will let you know when we get final results.  Also started her on doxycycline 100 mg twice a day for 7 days.  But Dr. Janese Banks wanted Dr. reviewed to see the patient because she does not feel like this is shingles.  Was given an appointment for October 31 at 12 AM.  Called son to let him know that we got an appointment with Dr. Elsworth Soho and he will make sure she goes to.  If anything develops with her culture we will notify the patient and son.

## 2018-07-25 LAB — AEROBIC/ANAEROBIC CULTURE W GRAM STAIN (SURGICAL/DEEP WOUND)
Culture: NO GROWTH
Gram Stain: NONE SEEN

## 2018-08-23 ENCOUNTER — Inpatient Hospital Stay: Payer: Medicare Other

## 2018-08-23 ENCOUNTER — Other Ambulatory Visit: Payer: Self-pay

## 2018-08-23 ENCOUNTER — Inpatient Hospital Stay: Payer: Medicare Other | Attending: Oncology

## 2018-08-23 DIAGNOSIS — N186 End stage renal disease: Secondary | ICD-10-CM | POA: Insufficient documentation

## 2018-08-23 DIAGNOSIS — N189 Chronic kidney disease, unspecified: Principal | ICD-10-CM

## 2018-08-23 DIAGNOSIS — D631 Anemia in chronic kidney disease: Secondary | ICD-10-CM | POA: Insufficient documentation

## 2018-08-23 DIAGNOSIS — E1122 Type 2 diabetes mellitus with diabetic chronic kidney disease: Secondary | ICD-10-CM | POA: Diagnosis not present

## 2018-08-23 DIAGNOSIS — I5032 Chronic diastolic (congestive) heart failure: Secondary | ICD-10-CM | POA: Insufficient documentation

## 2018-08-23 DIAGNOSIS — D472 Monoclonal gammopathy: Secondary | ICD-10-CM | POA: Insufficient documentation

## 2018-08-23 DIAGNOSIS — Z794 Long term (current) use of insulin: Secondary | ICD-10-CM | POA: Diagnosis not present

## 2018-08-23 DIAGNOSIS — I132 Hypertensive heart and chronic kidney disease with heart failure and with stage 5 chronic kidney disease, or end stage renal disease: Secondary | ICD-10-CM | POA: Insufficient documentation

## 2018-08-23 LAB — CBC WITH DIFFERENTIAL/PLATELET
Abs Immature Granulocytes: 0.01 10*3/uL (ref 0.00–0.07)
Basophils Absolute: 0 10*3/uL (ref 0.0–0.1)
Basophils Relative: 1 %
Eosinophils Absolute: 0.6 10*3/uL — ABNORMAL HIGH (ref 0.0–0.5)
Eosinophils Relative: 11 %
HCT: 35.6 % — ABNORMAL LOW (ref 36.0–46.0)
Hemoglobin: 11.6 g/dL — ABNORMAL LOW (ref 12.0–15.0)
Immature Granulocytes: 0 %
Lymphocytes Relative: 38 %
Lymphs Abs: 2 10*3/uL (ref 0.7–4.0)
MCH: 30.4 pg (ref 26.0–34.0)
MCHC: 32.6 g/dL (ref 30.0–36.0)
MCV: 93.2 fL (ref 80.0–100.0)
Monocytes Absolute: 0.6 10*3/uL (ref 0.1–1.0)
Monocytes Relative: 12 %
Neutro Abs: 1.9 10*3/uL (ref 1.7–7.7)
Neutrophils Relative %: 38 %
Platelets: 131 10*3/uL — ABNORMAL LOW (ref 150–400)
RBC: 3.82 MIL/uL — ABNORMAL LOW (ref 3.87–5.11)
RDW: 14 % (ref 11.5–15.5)
WBC: 5.2 10*3/uL (ref 4.0–10.5)
nRBC: 0 % (ref 0.0–0.2)

## 2018-09-10 IMAGING — CR DG ABDOMEN ACUTE W/ 1V CHEST
3 series · 3 of 3 positions shown · non-contrast
Comparison: CT scan of the abdomen dated 03/02/2018 and chest x-ray
dated 02/20/2018

CLINICAL DATA: Severe left-sided abdominal pain. Vomiting.
Diarrhea.

EXAM:
DG ABDOMEN ACUTE W/ 1V CHEST

[chest ap]
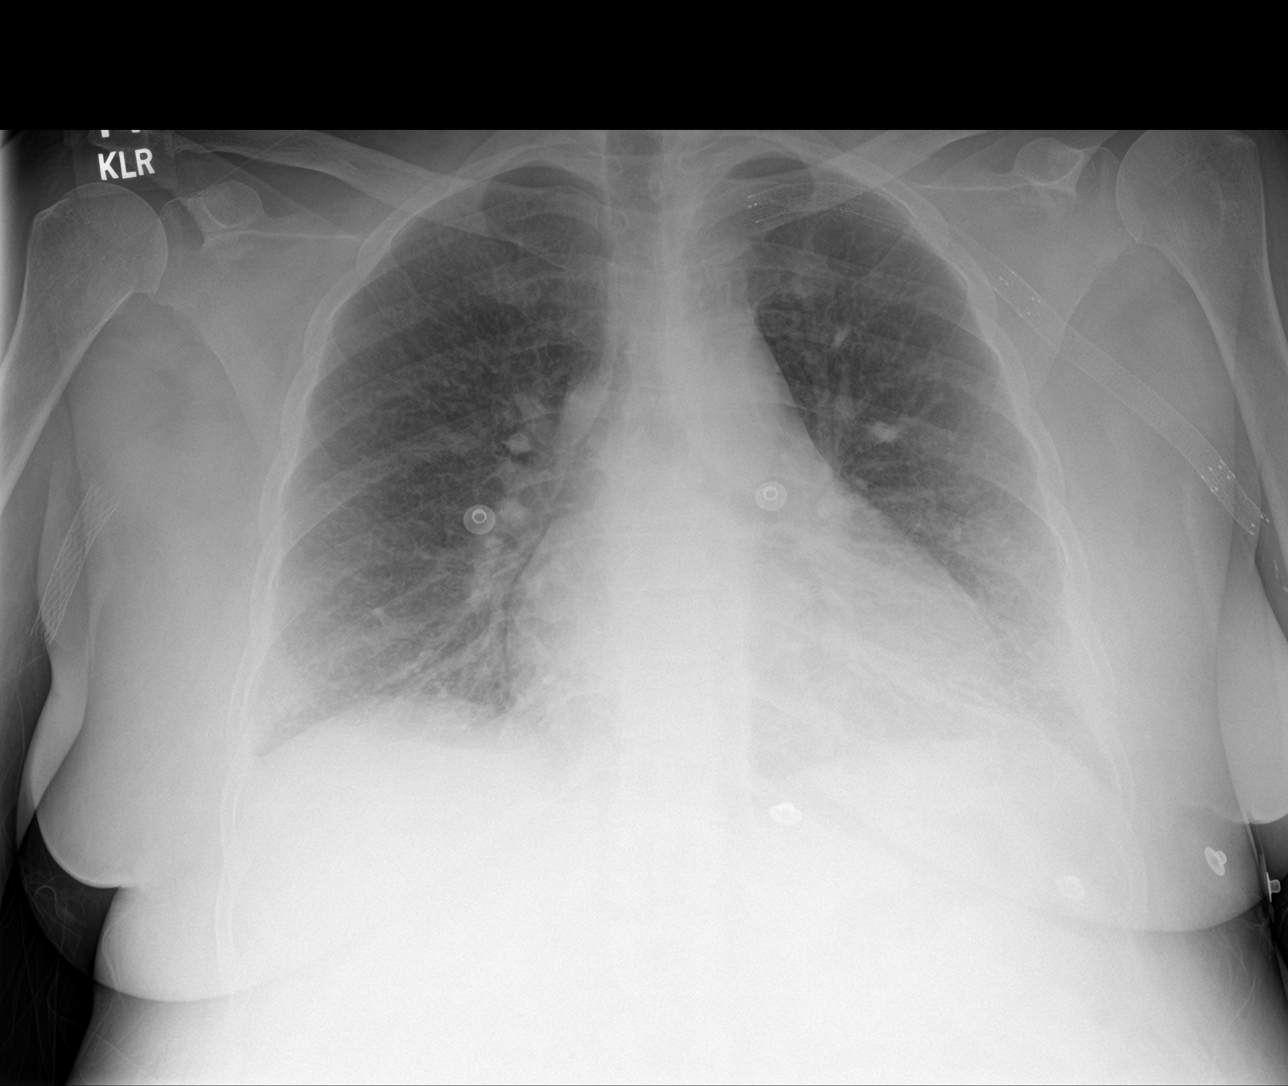

[abdomen erect]
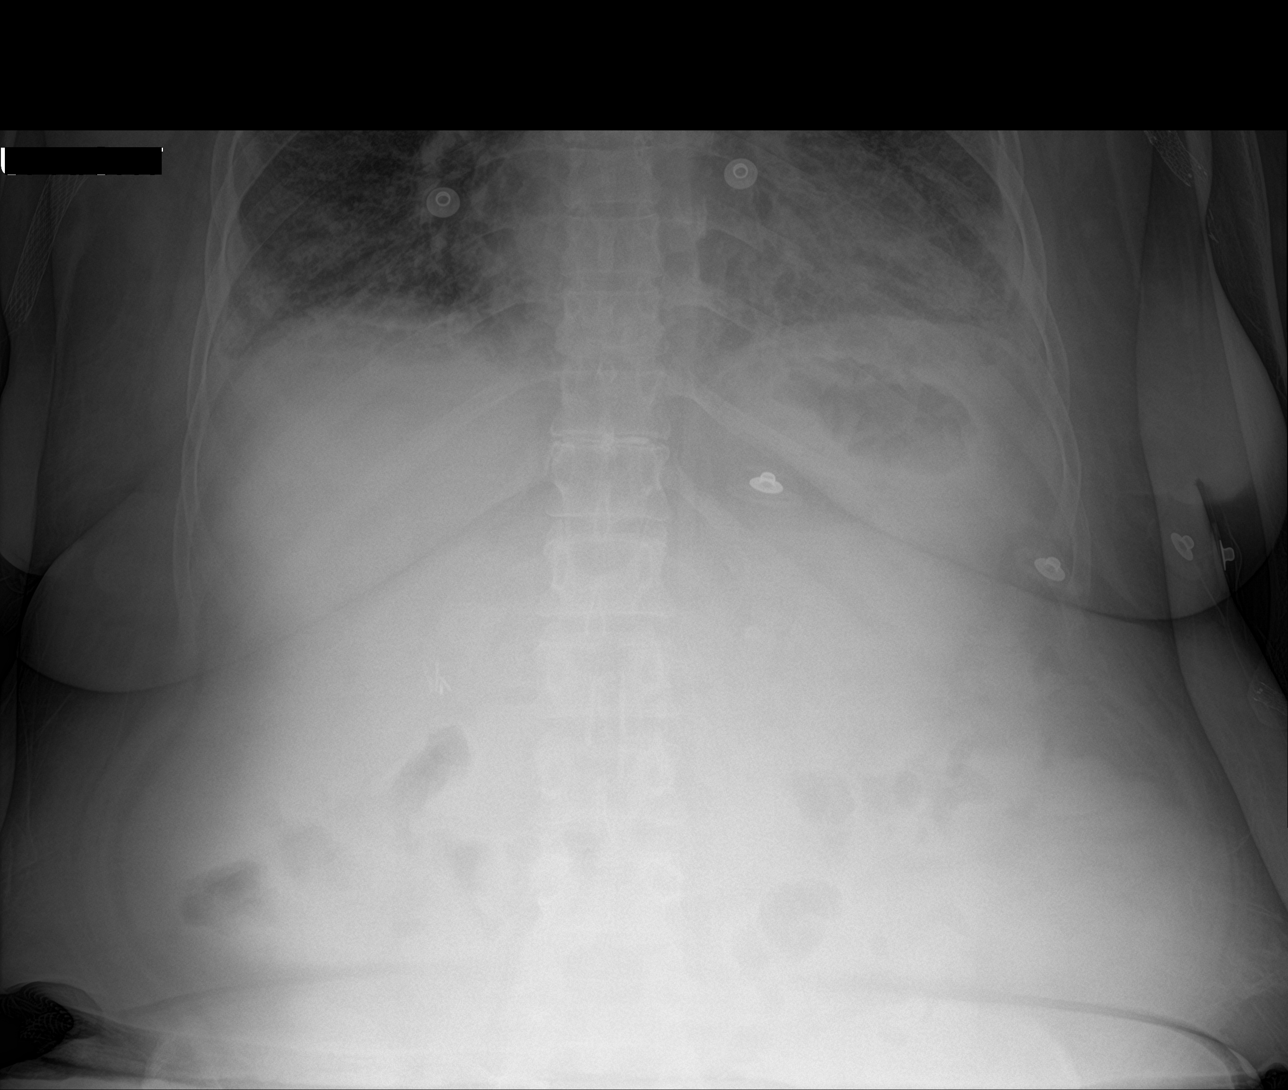

[abdomen supine]
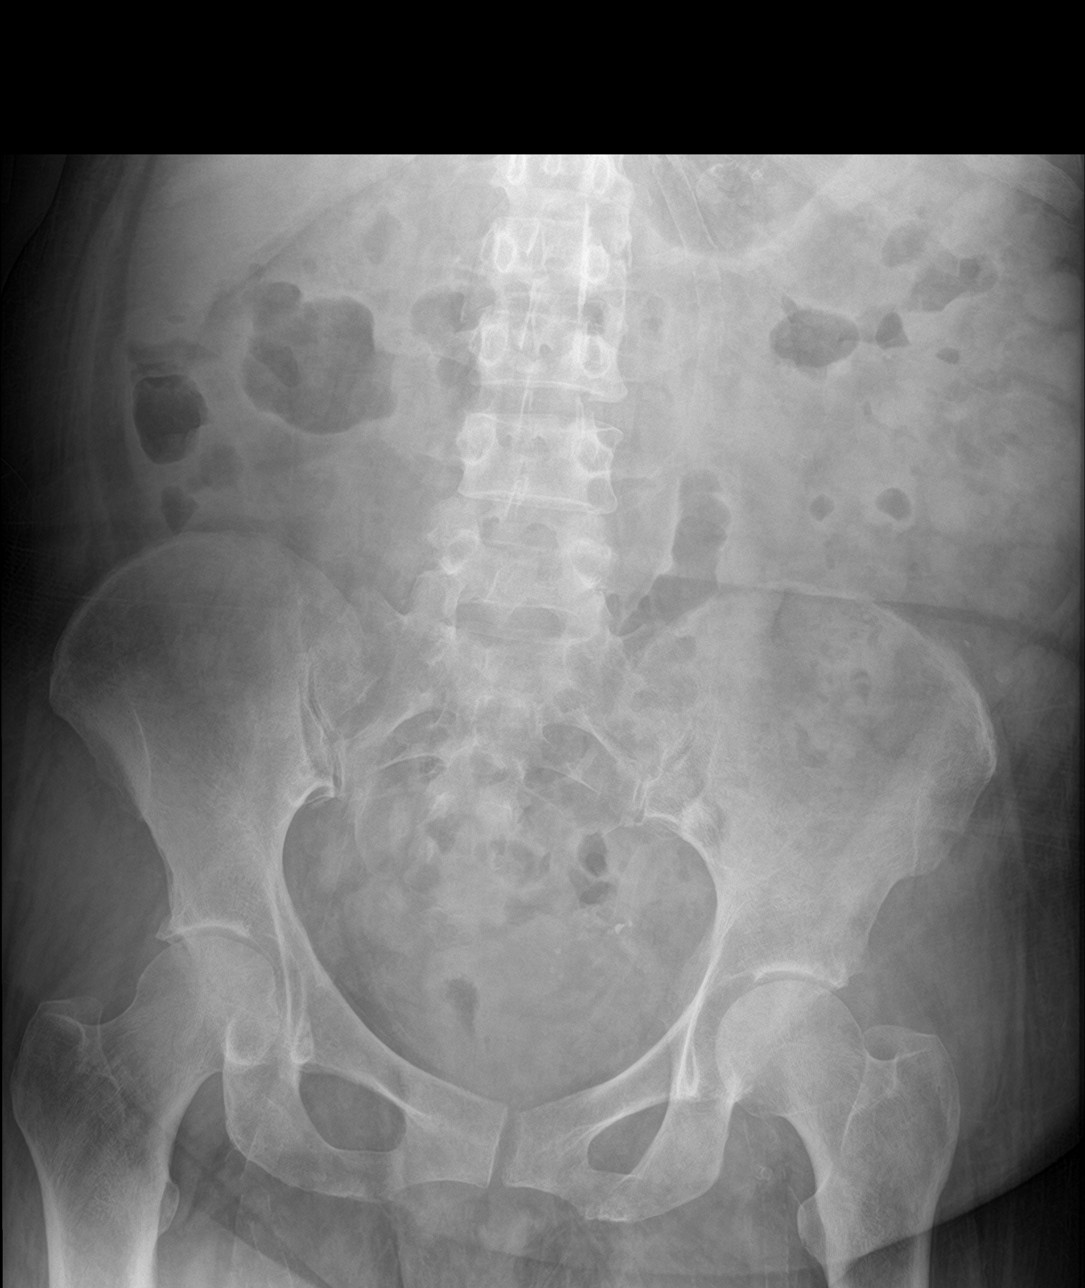

[3 of 3 positions shown; findings below may reference images not displayed]

FINDINGS: Heart size is normal. Minimal distention of the azygos vein with
slight prominence of the pulmonary vascularity. Slight haziness at
both lung bases may represent mild edema. Probable tiny right
pleural effusion. Vascular stent in the left axilla.

No free air in the abdomen. Bowel gas pattern is normal. Extensive
vascular calcifications in the abdomen. Aortic atherosclerosis.
Surgical clips from previous cholecystectomy. No bone abnormality.
IMPRESSION: No acute abnormality of the abdomen.

Slight pulmonary vascular congestion and mild interstitial edema at
the lung bases with a tiny right effusion.

## 2018-09-28 ENCOUNTER — Other Ambulatory Visit: Payer: Self-pay

## 2018-09-28 ENCOUNTER — Inpatient Hospital Stay: Payer: Medicare Other | Attending: Oncology | Admitting: Oncology

## 2018-09-28 ENCOUNTER — Inpatient Hospital Stay: Payer: Medicare Other

## 2018-09-28 ENCOUNTER — Encounter: Payer: Self-pay | Admitting: Oncology

## 2018-09-28 VITALS — BP 144/65 | HR 73 | Temp 97.3°F | Resp 16 | Ht 65.0 in | Wt 192.3 lb

## 2018-09-28 DIAGNOSIS — J449 Chronic obstructive pulmonary disease, unspecified: Secondary | ICD-10-CM | POA: Insufficient documentation

## 2018-09-28 DIAGNOSIS — Z79899 Other long term (current) drug therapy: Secondary | ICD-10-CM | POA: Diagnosis not present

## 2018-09-28 DIAGNOSIS — Z992 Dependence on renal dialysis: Secondary | ICD-10-CM | POA: Diagnosis not present

## 2018-09-28 DIAGNOSIS — R21 Rash and other nonspecific skin eruption: Secondary | ICD-10-CM | POA: Insufficient documentation

## 2018-09-28 DIAGNOSIS — Z794 Long term (current) use of insulin: Secondary | ICD-10-CM | POA: Diagnosis not present

## 2018-09-28 DIAGNOSIS — I2510 Atherosclerotic heart disease of native coronary artery without angina pectoris: Secondary | ICD-10-CM | POA: Insufficient documentation

## 2018-09-28 DIAGNOSIS — M545 Low back pain: Secondary | ICD-10-CM | POA: Insufficient documentation

## 2018-09-28 DIAGNOSIS — I5032 Chronic diastolic (congestive) heart failure: Secondary | ICD-10-CM | POA: Diagnosis not present

## 2018-09-28 DIAGNOSIS — I132 Hypertensive heart and chronic kidney disease with heart failure and with stage 5 chronic kidney disease, or end stage renal disease: Secondary | ICD-10-CM | POA: Insufficient documentation

## 2018-09-28 DIAGNOSIS — D472 Monoclonal gammopathy: Secondary | ICD-10-CM | POA: Insufficient documentation

## 2018-09-28 DIAGNOSIS — L989 Disorder of the skin and subcutaneous tissue, unspecified: Secondary | ICD-10-CM | POA: Insufficient documentation

## 2018-09-28 DIAGNOSIS — R5381 Other malaise: Secondary | ICD-10-CM | POA: Insufficient documentation

## 2018-09-28 DIAGNOSIS — Z7982 Long term (current) use of aspirin: Secondary | ICD-10-CM | POA: Diagnosis not present

## 2018-09-28 DIAGNOSIS — Z85528 Personal history of other malignant neoplasm of kidney: Secondary | ICD-10-CM | POA: Diagnosis not present

## 2018-09-28 DIAGNOSIS — Z87891 Personal history of nicotine dependence: Secondary | ICD-10-CM | POA: Diagnosis not present

## 2018-09-28 DIAGNOSIS — D631 Anemia in chronic kidney disease: Secondary | ICD-10-CM

## 2018-09-28 DIAGNOSIS — K219 Gastro-esophageal reflux disease without esophagitis: Secondary | ICD-10-CM | POA: Insufficient documentation

## 2018-09-28 DIAGNOSIS — R5383 Other fatigue: Secondary | ICD-10-CM | POA: Insufficient documentation

## 2018-09-28 DIAGNOSIS — I252 Old myocardial infarction: Secondary | ICD-10-CM | POA: Diagnosis not present

## 2018-09-28 DIAGNOSIS — N189 Chronic kidney disease, unspecified: Principal | ICD-10-CM

## 2018-09-28 DIAGNOSIS — E1122 Type 2 diabetes mellitus with diabetic chronic kidney disease: Secondary | ICD-10-CM

## 2018-09-28 DIAGNOSIS — N185 Chronic kidney disease, stage 5: Secondary | ICD-10-CM | POA: Diagnosis not present

## 2018-09-28 LAB — CBC WITH DIFFERENTIAL/PLATELET
Abs Immature Granulocytes: 0.01 10*3/uL (ref 0.00–0.07)
Basophils Absolute: 0 10*3/uL (ref 0.0–0.1)
Basophils Relative: 1 %
Eosinophils Absolute: 0.5 10*3/uL (ref 0.0–0.5)
Eosinophils Relative: 9 %
HCT: 33 % — ABNORMAL LOW (ref 36.0–46.0)
Hemoglobin: 10.3 g/dL — ABNORMAL LOW (ref 12.0–15.0)
Immature Granulocytes: 0 %
Lymphocytes Relative: 24 %
Lymphs Abs: 1.3 10*3/uL (ref 0.7–4.0)
MCH: 28.9 pg (ref 26.0–34.0)
MCHC: 31.2 g/dL (ref 30.0–36.0)
MCV: 92.4 fL (ref 80.0–100.0)
Monocytes Absolute: 0.7 10*3/uL (ref 0.1–1.0)
Monocytes Relative: 12 %
Neutro Abs: 3 10*3/uL (ref 1.7–7.7)
Neutrophils Relative %: 54 %
Platelets: 269 10*3/uL (ref 150–400)
RBC: 3.57 MIL/uL — ABNORMAL LOW (ref 3.87–5.11)
RDW: 14.4 % (ref 11.5–15.5)
WBC: 5.6 10*3/uL (ref 4.0–10.5)
nRBC: 0 % (ref 0.0–0.2)

## 2018-09-28 NOTE — Progress Notes (Signed)
Patient here for follow up. She reports feeling tired, and having mild nasal congestion.

## 2018-09-28 NOTE — Progress Notes (Signed)
Hematology/Oncology Consult note [facility]  [phone] [phone]  Patient Care Team: Freddy Finner, NP as PCP - General (Nurse Practitioner) Lavonia Dana, MD as Consulting Physician (Internal Medicine)   Name of the patient: [name]  [id number]  11-Mar-1961   Date of visit: 09/28/18  Diagnosis- 1. Anemia of chronic kidney disease  2. MGUS  Chief complaint/ Reason for visit- routine f/u of anemia  Heme/Onc history:  patient is a 58 year old female with a past medical history significant for coronary artery disease, type 2 diabetes with neuropathy, hypertension hyperlipidemia and osteopenia arthritis. She has ESRD secondary to HTN/DM and has been on dialysis for about 14 years. She was seen by rheumatology for complaints of low back pain. She also had painful subcutaneous skin rashes and was seen by dermatology and biopsy of one of the lesions showed mild septal panicky like this. As a part of workup by rheumatology doctor Dr. Lezlie Lye- Meda Coffee she had SPEP done which came back abnormal with the presence of M spike and hence has been referred to [place]. There was still some ongoing concern about possible lupus and she follows up with rheumatology. Her last colonoscopy was in November 2017 which showed tubular adenoma and a repeat colonoscopy was recommended in 5 years.  CBC from 01/08/2017 showed white count of 6.7, H&H of 13.9/42.8 and a platelet count of 161. CMP showed elevated BUN of 43 and creatinine of 5.22. Calcium was normal at 9.7. Total protein was mildly elevated at 8.6. An albumin was normal at 3.9. SPEP from 11/06/2016 did not reveal any M spike. M spike of 4.2% was noted on 24-hour urine protein.A lambda free light chain ratio was normal although both And lambda were elevated due to chronic kidney disease.she gets epo through dialysis. She also has kidney mass suspicious for RCC being followed by Dr. Erlene Quan   Interval  history-overall patient feels well today.  Her energy levels are at baseline.  She continues with dialysis and gets EPO through dialysis.  Skin lesions over her left arm have healed.  ECOG PS- 1 Pain scale- 0 Opioid associated constipation- no  Review of systems- Review of Systems  Constitutional: Positive for malaise/fatigue. Negative for chills, fever and weight loss.  HENT: Negative for congestion, ear discharge and nosebleeds.   Eyes: Negative for blurred vision.  Respiratory: Negative for cough, hemoptysis, sputum production, shortness of breath and wheezing.   Cardiovascular: Negative for chest pain, palpitations, orthopnea and claudication.  Gastrointestinal: Negative for abdominal pain, blood in stool, constipation, diarrhea, heartburn, melena, nausea and vomiting.  Genitourinary: Negative for dysuria, flank pain, frequency, hematuria and urgency.  Musculoskeletal: Negative for back pain, joint pain and myalgias.  Skin: Negative for rash.  Neurological: Negative for dizziness, tingling, focal weakness, seizures, weakness and headaches.  Endo/Heme/Allergies: Does not bruise/bleed easily.  Psychiatric/Behavioral: Negative for depression and suicidal ideas. The patient does not have insomnia.       Allergies  Allergen Reactions  . Ace Inhibitors Swelling and Anaphylaxis  . Ativan [Lorazepam] Other (See Comments)    Reaction:Hallucinations and headaches  . Compazine [Prochlorperazine Edisylate] Anaphylaxis, Nausea And Vomiting and Other (See Comments)    Other reaction(s): dystonia from this vs. Reglan, 23 Jul - patient relates that she takes promethazine frequently with no problems  . Sumatriptan Succinate Other (See Comments)    Other reaction(s): delirium and hallucinations per [facility] records  . Dilaudid [Hydromorphone Hcl] Other (See Comments)    Delirium   .  Ondansetron Other (See Comments)    hallucinations    . Zofran [Ondansetron Hcl] Other (See Comments)     Reaction:  hallucinations   . Codeine Nausea And Vomiting  . Gabapentin Other (See Comments)    Reaction:  Unknown    . Lac Bovis Nausea And Vomiting  . Losartan Nausea Only  . Oxycodone Anxiety  . Prochlorperazine Other (See Comments)    Reaction:  Unknown . Patient does not remember reaction but she does have vertigo and anxiety along with n and v at times. Could be used to treat any of these   . Reglan [Metoclopramide] Other (See Comments)    Per patient her Dr. Evelina Bucy her off it   . Scopolamine Other (See Comments)    Dizziness, also has vertigo already  . Tape Rash  . Tapentadol Rash     Past Medical History:  Diagnosis Date  . Anemia   . Anginal pain ([place])   . Anxiety   . Arthritis   . Asthma   . Broken wrist   . Bronchitis   . chronic diastolic CHF 05/03/7516  . COPD (chronic obstructive pulmonary disease) ([place])   . Coronary artery disease    a. cath 2013: stenting to RCA (report not available); b. cath 2014: LM nl, pLAD 40%, mLAD nl, ost LCx 40%, mid LCx nl, pRCA 30% @ site of prior stent, mRCA 50%  . Depression   . Diabetes mellitus without complication ([place])   . Diabetic neuropathy ([place])   . dialysis 2006  . Diverticulosis   . Dizziness   . Dyspnea   . Elevated lipids   . Environmental and seasonal allergies   . ESRD (end stage renal disease) on dialysis ([place])    M-W-F  . GERD (gastroesophageal reflux disease)   . Headache   . History of hiatal hernia   . HOH (hard of hearing)   . Hx of pancreatitis 2015  . Hypertension   . Lower extremity edema   . Mitral regurgitation    a. echo 10/2013: EF 62%, noWMA, mildly dilated LA, mild to mod MR/TR, GR1DD  . Myocardial infarction ([place])   . Orthopnea   . Pneumonia   . Renal cancer ([place])   . Renal insufficiency    Pt is on dialysis on M,W + F.  . Wheezing      Past Surgical History:  Procedure Laterality Date  . A/V SHUNTOGRAM Left 01/20/2018   Procedure: A/V SHUNTOGRAM;  Surgeon: Algernon Huxley, MD;   Location: [place] [facility];  Service: Cardiovascular;  Laterality: Left;  . ABDOMINAL HYSTERECTOMY  1992  . AMPUTATION TOE Left 10/02/2017   Procedure: AMPUTATION TOE-LEFT GREAT TOE;  Surgeon: Albertine Patricia, DPM;  Location: [facility];  Service: Podiatry;  Laterality: Left;  . APPENDECTOMY    . CARDIAC CATHETERIZATION Left 07/26/2015   Procedure: Left Heart Cath and Coronary Angiography;  Surgeon: Dionisio David, MD;  Location: [place] [facility];  Service: Cardiovascular;  Laterality: Left;  . CATARACT EXTRACTION W/ INTRAOCULAR LENS IMPLANT Right   . CATARACT EXTRACTION W/PHACO Left 03/10/2017   Procedure: CATARACT EXTRACTION PHACO AND INTRAOCULAR LENS PLACEMENT (IOC);  Surgeon: Birder Robson, MD;  Location: [facility];  Service: Ophthalmology;  Laterality: Left;  [place] 00:51.9 AP% 14.2 CDE 7.39 fluid pack lot # [id number]  . CHOLECYSTECTOMY    . COLONOSCOPY WITH PROPOFOL N/A 08/12/2016   Procedure: COLONOSCOPY WITH PROPOFOL;  Surgeon: Lollie Sails, MD;  Location: [facility];  Service:  Endoscopy;  Laterality: N/A;  . DIALYSIS FISTULA CREATION Left    upper arm  . dialysis grafts    . ESOPHAGOGASTRODUODENOSCOPY N/A 03/08/2015   Procedure: ESOPHAGOGASTRODUODENOSCOPY (EGD);  Surgeon: Manya Silvas, MD;  Location: [facility] ENDOSCOPY;  Service: Endoscopy;  Laterality: N/A;  . ESOPHAGOGASTRODUODENOSCOPY (EGD) WITH PROPOFOL N/A 03/18/2016   Procedure: ESOPHAGOGASTRODUODENOSCOPY (EGD) WITH PROPOFOL;  Surgeon: [name], MD;  Location: [facility] ENDOSCOPY;  Service: Endoscopy;  Laterality: N/A;  . EYE SURGERY Right 2018  . FECAL TRANSPLANT N/A 08/23/2015   Procedure: FECAL TRANSPLANT;  Surgeon: Manya Silvas, MD;  Location: [facility] ENDOSCOPY;  Service: Endoscopy;  Laterality: N/A;  . HAND SURGERY Bilateral   . PERIPHERAL VASCULAR CATHETERIZATION N/A 12/20/2015   Procedure: Thrombectomy of dialysis access versus permcath placement;  Surgeon: Algernon Huxley, MD;  Location: [place] [facility];   Service: Cardiovascular;  Laterality: N/A;  . PERIPHERAL VASCULAR CATHETERIZATION N/A 12/20/2015   Procedure: A/V Shunt Intervention;  Surgeon: Algernon Huxley, MD;  Location: [place] [facility];  Service: Cardiovascular;  Laterality: N/A;  . PERIPHERAL VASCULAR CATHETERIZATION N/A 12/20/2015   Procedure: A/V Shuntogram/Fistulagram;  Surgeon: Algernon Huxley, MD;  Location: [place] [facility];  Service: Cardiovascular;  Laterality: N/A;  . PERIPHERAL VASCULAR CATHETERIZATION N/A 01/02/2016   Procedure: A/V Shuntogram/Fistulagram;  Surgeon: Algernon Huxley, MD;  Location: [place] [facility];  Service: Cardiovascular;  Laterality: N/A;  . PERIPHERAL VASCULAR CATHETERIZATION N/A 01/02/2016   Procedure: A/V Shunt Intervention;  Surgeon: Algernon Huxley, MD;  Location: [place] [facility];  Service: Cardiovascular;  Laterality: N/A;    Social History   Socioeconomic History  . Marital status: Divorced    Spouse name: Not on file  . Number of children: Not on file  . Years of education: Not on file  . Highest education level: Not on file  Occupational History  . Not on file  Social Needs  . Financial resource strain: Not hard at all  . Food insecurity:    Worry: Never true    Inability: Never true  . Transportation needs:    Medical: No    Non-medical: No  Tobacco Use  . Smoking status: Former Smoker    Packs/day: 0.50    Years: 40.00    Pack years: 20.00    Types: Cigarettes    Last attempt to quit: 02/13/2015    Years since quitting: 3.6  . Smokeless tobacco: Never Used  Substance and Sexual Activity  . Alcohol use: Not Currently    Comment: glass wine week per pt  . Drug use: Yes    Types: Marijuana    Comment: once a day  . Sexual activity: Never  Lifestyle  . Physical activity:    Days per week: 7 days    Minutes per session: 10 min  . Stress: Very much  Relationships  . Social connections:    Talks on phone: More than three times a week    Gets together: More than three times a  week    Attends religious service: More than 4 times per year    Active member of club or organization: Yes    Attends meetings of clubs or organizations: Never    Relationship status: Divorced  . Intimate partner violence:    Fear of current or ex partner: No    Emotionally abused: No    Physically abused: No    Forced sexual activity: No  Other Topics Concern  . Not on  file  Social History Narrative  . Not on file    Family History  Problem Relation Age of Onset  . Kidney disease Mother   . Diabetes Mother   . Cancer Father   . Kidney disease Sister      Current Outpatient Medications:  .  albuterol (PROVENTIL HFA;VENTOLIN HFA) 108 (90 Base) MCG/ACT inhaler, Inhale 2 puffs into the lungs every 6 (six) hours as needed for wheezing or shortness of breath., Disp: 1 Inhaler, Rfl: 2 .  albuterol (PROVENTIL) (2.5 MG/3ML) 0.083% nebulizer solution, Take 2.5 mg by nebulization every 4 (four) hours as needed for wheezing or shortness of breath., Disp: , Rfl:  .  amLODipine (NORVASC) 10 MG tablet, Take 10 mg by mouth daily., Disp: , Rfl:  .  aspirin EC 81 MG tablet, Take 81 mg by mouth daily., Disp: , Rfl:  .  atorvastatin (LIPITOR) 20 MG tablet, Take 20 mg by mouth every evening. , Disp: , Rfl:  .  Biotin 1 MG CAPS, Take 1 mg by mouth daily., Disp: , Rfl:  .  cetirizine (ZYRTEC) 10 MG tablet, Take 10 mg by mouth daily. , Disp: , Rfl:  .  cholecalciferol (VITAMIN D) 400 units TABS tablet, Take 400 Units by mouth daily., Disp: , Rfl:  .  cyanocobalamin 1000 MCG tablet, Take 1,000 mcg by mouth daily. , Disp: , Rfl:  .  diclofenac sodium (VOLTAREN) 1 % GEL, Apply 1 application topically daily as needed (for rash)., Disp: , Rfl:  .  ferrous sulfate ([employer] IRON) 325 (65 FE) MG tablet, Take 325 mg by mouth daily with breakfast., Disp: , Rfl:  .  FLUoxetine (PROZAC) 20 MG capsule, Take 1 capsule (20 mg total) by mouth daily. (Patient taking differently: Take 20 mg by mouth at bedtime. ), Disp: 30  capsule, Rfl: 0 .  hydrocortisone cream 0.5 %, Apply topically 3 (three) times daily., Disp: 30 g, Rfl: 0 .  insulin aspart (NOVOLOG FLEXPEN) 100 UNIT/ML FlexPen, Inject 4-15 Units into the skin 4 (four) times daily as needed for high blood sugar (> 100, per sliding scale)., Disp: , Rfl:  .  lidocaine (LIDODERM) 5 %, Place 1 patch onto the skin every 12 (twelve) hours. Remove & Discard patch within 12 hours or as directed by MD, Disp: 10 patch, Rfl: 0 .  lipase/protease/amylase (CREON) 12000 UNITS CPEP capsule, Take 36000 mg in the morning, 24000 midday & 36000 in the evening, Disp: , Rfl:  .  multivitamin (RENA-VIT) TABS tablet, Take 1 tablet by mouth daily. , Disp: , Rfl:  .  nitroGLYCERIN (NITROSTAT) 0.4 MG SL tablet, Place 0.4 mg under the tongue every 5 (five) minutes as needed for chest pain. , Disp: , Rfl:  .  ranitidine (ZANTAC) 300 MG capsule, Take 1 capsule by mouth every evening., Disp: , Rfl: 1 .  rifaximin (XIFAXAN) 550 MG TABS tablet, Take 550 mg by mouth 2 (two) times daily., Disp: , Rfl:  .  sevelamer carbonate (RENVELA) 800 MG tablet, Take 800-2,400 mg by mouth See admin instructions. Take 2400 mg by mouth 3 times daily with meals and take 800 mg by mouth with snacks, Disp: , Rfl:   Physical exam:  Vitals:   09/28/18 0836  BP: (!) 144/65  Pulse: 73  Resp: 16  Temp: (!) 97.3 F (36.3 C)  TempSrc: Tympanic  SpO2: 100%  Weight: 192 lb 4.8 oz (87.2 kg)  Height: 5\' 5"  (1.651 m)   Physical Exam Constitutional:  General: She is not in acute distress. HENT:     Head: Normocephalic and atraumatic.  Eyes:     Pupils: Pupils are equal, round, and reactive to light.  Neck:     Musculoskeletal: Normal range of motion.  Cardiovascular:     Rate and Rhythm: Normal rate and regular rhythm.     Heart sounds: Normal heart sounds.  Pulmonary:     Effort: Pulmonary effort is normal.     Breath sounds: Normal breath sounds.  Abdominal:     General: Bowel sounds are normal.      Palpations: Abdomen is soft.  Skin:    General: Skin is warm and dry.  Neurological:     Mental Status: She is alert and oriented to person, place, and time.      CMP Latest Ref Rng & Units 06/19/2018  Glucose 70 - 99 mg/dL 183(H)  BUN 6 - 20 mg/dL 45(H)  Creatinine 0.44 - 1.00 mg/dL 6.34(H)  Sodium 135 - 145 mmol/L 133(L)  Potassium 3.5 - 5.1 mmol/L 5.1  Chloride 98 - 111 mmol/L 95(L)  CO2 22 - 32 mmol/L 27  Calcium 8.9 - 10.3 mg/dL 8.3(L)  Total Protein 6.5 - 8.1 g/dL -  Total Bilirubin 0.3 - 1.2 mg/dL -  Alkaline Phos 38 - 126 U/L -  AST 15 - 41 U/L -  ALT 0 - 44 U/L -   CBC Latest Ref Rng & Units 09/28/2018  WBC 4.0 - 10.5 K/uL 5.6  Hemoglobin 12.0 - 15.0 g/dL 10.3(L)  Hematocrit 36.0 - 46.0 % 33.0(L)  Platelets 150 - 400 K/uL 269      Assessment and plan- Patient is a 58 y.o. female with following issues:  1.  Anemia of chronic kidney disease: Currently patient continues to get EPO through dialysis.  Her hemoglobin is stable around 10.  There was a concern in the past to give her Epogen given her renal mass.  However this is been followed by Dr. Erlene Quan and overall her renal mass is stable and it was okay to give EPO.  Repeat CBC with differential in 3 in 6 months and I will see her back in 6 months  2.  MGUS: Noted on outside labs.  However when we repeated our labs here there was no evidence of M protein on SPEP.  Both kappa and lambda light chains were elevated with a normal free light chain ratio.  I will repeat these labs in September 2020.   Visit Diagnosis 1. Anemia of chronic kidney failure, stage 5 (HCC)      Dr. Randa Evens, MD, MPH [facility] [phone] 09/28/2018 8:59 AM

## 2018-09-30 ENCOUNTER — Ambulatory Visit (INDEPENDENT_AMBULATORY_CARE_PROVIDER_SITE_OTHER): Payer: Medicare Other | Admitting: Vascular Surgery

## 2018-09-30 ENCOUNTER — Encounter (INDEPENDENT_AMBULATORY_CARE_PROVIDER_SITE_OTHER): Payer: Self-pay | Admitting: Vascular Surgery

## 2018-09-30 VITALS — BP 145/52 | HR 73 | Resp 16 | Ht 65.0 in | Wt 189.4 lb

## 2018-09-30 DIAGNOSIS — I890 Lymphedema, not elsewhere classified: Secondary | ICD-10-CM | POA: Diagnosis not present

## 2018-09-30 DIAGNOSIS — I10 Essential (primary) hypertension: Secondary | ICD-10-CM

## 2018-09-30 DIAGNOSIS — I6523 Occlusion and stenosis of bilateral carotid arteries: Secondary | ICD-10-CM

## 2018-09-30 DIAGNOSIS — I872 Venous insufficiency (chronic) (peripheral): Secondary | ICD-10-CM

## 2018-09-30 DIAGNOSIS — N186 End stage renal disease: Secondary | ICD-10-CM

## 2018-09-30 DIAGNOSIS — T829XXS Unspecified complication of cardiac and vascular prosthetic device, implant and graft, sequela: Secondary | ICD-10-CM

## 2018-09-30 DIAGNOSIS — I2510 Atherosclerotic heart disease of native coronary artery without angina pectoris: Secondary | ICD-10-CM | POA: Diagnosis not present

## 2018-09-30 DIAGNOSIS — Z992 Dependence on renal dialysis: Secondary | ICD-10-CM

## 2018-09-30 NOTE — Progress Notes (Signed)
MRN : [id number]  [name] is a 58 y.o. (1961-09-02) female who presents with chief complaint of  Chief Complaint  Patient presents with  . Follow-up  .  History of Present Illness:   The patient is seen for follow up evaluation of carotid stenosis. The carotid stenosis followed by ultrasound but she is overdue for a study.   The patient denies amaurosis fugax. There is no recent history of TIA symptoms or focal motor deficits. There is no prior documented CVA.  The patient is taking enteric-coated aspirin 81 mg daily.  There is no history of migraine headaches. There is no history of seizures.  Currently the patient is maintained via a left brachial axillary graft.   The patient notes no increase in bleeding time after decannulation.  The patient has not been informed that there is any problem with increased recirculation.  The patient denies hand pain or other symptoms consistent with steal phenomena.  No significant arm swelling.  The patient denies redness or swelling at the access site. The patient denies fever or chills at home or while on dialysis.  The patient has a history of coronary artery disease, no recent episodes of angina or shortness of breath. The patient denies PAD or claudication symptoms. There is a history of hyperlipidemia which is being treated with a statin.   Duplex ultrasound of the AV access shows a patent access.  The previously noted stenosis is unchanged compared to last study.   Previous duplex ultrasound of the carotid arteries shows 50-69% bilateral ICA stenosis.   Current Meds  Medication Sig  . albuterol (PROVENTIL HFA;VENTOLIN HFA) 108 (90 Base) MCG/ACT inhaler Inhale 2 puffs into the lungs every 6 (six) hours as needed for wheezing or shortness of breath.  Marland Kitchen albuterol (PROVENTIL) (2.5 MG/3ML) 0.083% nebulizer solution Take 2.5 mg by nebulization every 4 (four) hours as needed for wheezing or shortness of breath.  Marland Kitchen amLODipine  (NORVASC) 10 MG tablet Take 10 mg by mouth daily.  Marland Kitchen aspirin EC 81 MG tablet Take 81 mg by mouth daily.  Marland Kitchen atorvastatin (LIPITOR) 20 MG tablet Take 20 mg by mouth every evening.   . Biotin 1 MG CAPS Take 1 mg by mouth daily.  . cetirizine (ZYRTEC) 10 MG tablet Take 10 mg by mouth daily.   . cholecalciferol (VITAMIN D) 400 units TABS tablet Take 400 Units by mouth daily.  . cyanocobalamin 1000 MCG tablet Take 1,000 mcg by mouth daily.   . diclofenac sodium (VOLTAREN) 1 % GEL Apply 1 application topically daily as needed (for rash).  . ferrous sulfate ([employer] IRON) 325 (65 FE) MG tablet Take 325 mg by mouth daily with breakfast.  . FLUoxetine (PROZAC) 20 MG capsule Take 1 capsule (20 mg total) by mouth daily. (Patient taking differently: Take 20 mg by mouth at bedtime. )  . hydrocortisone cream 0.5 % Apply topically 3 (three) times daily.  . insulin aspart (NOVOLOG FLEXPEN) 100 UNIT/ML FlexPen Inject 4-15 Units into the skin 4 (four) times daily as needed for high blood sugar (> 100, per sliding scale).  Marland Kitchen lidocaine (LIDODERM) 5 % Place 1 patch onto the skin every 12 (twelve) hours. Remove & Discard patch within 12 hours or as directed by MD  . lipase/protease/amylase (CREON) 12000 UNITS CPEP capsule Take 36000 mg in the morning, 24000 midday & 36000 in the evening  . LORazepam (ATIVAN PO) Take by mouth.  . multivitamin (RENA-VIT) TABS tablet Take 1 tablet by mouth  daily.   . nitroGLYCERIN (NITROSTAT) 0.4 MG SL tablet Place 0.4 mg under the tongue every 5 (five) minutes as needed for chest pain.   . ranitidine (ZANTAC) 300 MG capsule Take 1 capsule by mouth every evening.  . sevelamer carbonate (RENVELA) 800 MG tablet Take 800-2,400 mg by mouth See admin instructions. Take 2400 mg by mouth 3 times daily with meals and take 800 mg by mouth with snacks    Past Medical History:  Diagnosis Date  . Anemia   . Anginal pain ([place])   . Anxiety   . Arthritis   . Asthma   . Broken wrist   . Bronchitis     . chronic diastolic CHF 0/25/8527  . COPD (chronic obstructive pulmonary disease) ([place])   . Coronary artery disease    a. cath 2013: stenting to RCA (report not available); b. cath 2014: LM nl, pLAD 40%, mLAD nl, ost LCx 40%, mid LCx nl, pRCA 30% @ site of prior stent, mRCA 50%  . Depression   . Diabetes mellitus without complication ([place])   . Diabetic neuropathy ([place])   . dialysis 2006  . Diverticulosis   . Dizziness   . Dyspnea   . Elevated lipids   . Environmental and seasonal allergies   . ESRD (end stage renal disease) on dialysis ([place])    M-W-F  . GERD (gastroesophageal reflux disease)   . Headache   . History of hiatal hernia   . HOH (hard of hearing)   . Hx of pancreatitis 2015  . Hypertension   . Lower extremity edema   . Mitral regurgitation    a. echo 10/2013: EF 62%, noWMA, mildly dilated LA, mild to mod MR/TR, GR1DD  . Myocardial infarction ([place])   . Orthopnea   . Pneumonia   . Renal cancer ([place])   . Renal insufficiency    Pt is on dialysis on M,W + F.  . Wheezing     Past Surgical History:  Procedure Laterality Date  . A/V SHUNTOGRAM Left 01/20/2018   Procedure: A/V SHUNTOGRAM;  Surgeon: Algernon Huxley, MD;  Location: [place] [facility];  Service: Cardiovascular;  Laterality: Left;  . ABDOMINAL HYSTERECTOMY  1992  . AMPUTATION TOE Left 10/02/2017   Procedure: AMPUTATION TOE-LEFT GREAT TOE;  Surgeon: Albertine Patricia, DPM;  Location: [facility];  Service: Podiatry;  Laterality: Left;  . APPENDECTOMY    . CARDIAC CATHETERIZATION Left 07/26/2015   Procedure: Left Heart Cath and Coronary Angiography;  Surgeon: Dionisio David, MD;  Location: [place] [facility];  Service: Cardiovascular;  Laterality: Left;  . CATARACT EXTRACTION W/ INTRAOCULAR LENS IMPLANT Right   . CATARACT EXTRACTION W/PHACO Left 03/10/2017   Procedure: CATARACT EXTRACTION PHACO AND INTRAOCULAR LENS PLACEMENT (IOC);  Surgeon: Birder Robson, MD;  Location: [facility];  Service: Ophthalmology;   Laterality: Left;  [place] 00:51.9 AP% 14.2 CDE 7.39 fluid pack lot # [id number]  . CHOLECYSTECTOMY    . COLONOSCOPY WITH PROPOFOL N/A 08/12/2016   Procedure: COLONOSCOPY WITH PROPOFOL;  Surgeon: Lollie Sails, MD;  Location: [facility] ENDOSCOPY;  Service: Endoscopy;  Laterality: N/A;  . DIALYSIS FISTULA CREATION Left    upper arm  . dialysis grafts    . ESOPHAGOGASTRODUODENOSCOPY N/A 03/08/2015   Procedure: ESOPHAGOGASTRODUODENOSCOPY (EGD);  Surgeon: Manya Silvas, MD;  Location: [facility] ENDOSCOPY;  Service: Endoscopy;  Laterality: N/A;  . ESOPHAGOGASTRODUODENOSCOPY (EGD) WITH PROPOFOL N/A 03/18/2016   Procedure: ESOPHAGOGASTRODUODENOSCOPY (EGD) WITH PROPOFOL;  Surgeon: [name], MD;  Location: [facility]  ENDOSCOPY;  Service: Endoscopy;  Laterality: N/A;  . EYE SURGERY Right 2018  . FECAL TRANSPLANT N/A 08/23/2015   Procedure: FECAL TRANSPLANT;  Surgeon: Manya Silvas, MD;  Location[facility];  Service: Endoscopy;  Laterality: N/A;  . HAND SURGERY Bilateral   . PERIPHERAL VASCULAR CATHETERIZATION N/A 12/20/2015   Procedure: Thrombectomy of dialysis access versus permcath placement;  Surgeon: Algernon Huxley, MD;  Location: [place] [facility];  Service: Cardiovascular;  Laterality: N/A;  . PERIPHERAL VASCULAR CATHETERIZATION N/A 12/20/2015   Procedure: A/V Shunt Intervention;  Surgeon: Algernon Huxley, MD;  Location: [place] [facility];  Service: Cardiovascular;  Laterality: N/A;  . PERIPHERAL VASCULAR CATHETERIZATION N/A 12/20/2015   Procedure: A/V Shuntogram/Fistulagram;  Surgeon: Algernon Huxley, MD;  Location: [place] [facility];  Service: Cardiovascular;  Laterality: N/A;  . PERIPHERAL VASCULAR CATHETERIZATION N/A 01/02/2016   Procedure: A/V Shuntogram/Fistulagram;  Surgeon: Algernon Huxley, MD;  Location: [place] [facility];  Service: Cardiovascular;  Laterality: N/A;  . PERIPHERAL VASCULAR CATHETERIZATION N/A 01/02/2016   Procedure: A/V Shunt Intervention;  Surgeon: Algernon Huxley, MD;  Location: [place] [facility];  Service: Cardiovascular;  Laterality: N/A;    Social History Social History   Tobacco Use  . Smoking status: Former Smoker    Packs/day: 0.50    Years: 40.00    Pack years: 20.00    Types: Cigarettes    Last attempt to quit: 02/13/2015    Years since quitting: 3.6  . Smokeless tobacco: Never Used  Substance Use Topics  . Alcohol use: Not Currently    Comment: glass wine week per pt  . Drug use: Yes    Types: Marijuana    Comment: once a day    Family History Family History  Problem Relation Age of Onset  . Kidney disease Mother   . Diabetes Mother   . Cancer Father   . Kidney disease Sister     Allergies  Allergen Reactions  . Ace Inhibitors Swelling and Anaphylaxis  . Ativan [Lorazepam] Other (See Comments)    Reaction:Hallucinations and headaches  . Compazine [Prochlorperazine Edisylate] Anaphylaxis, Nausea And Vomiting and Other (See Comments)    Other reaction(s): dystonia from this vs. Reglan, 23 Jul - patient relates that she takes promethazine frequently with no problems  . Sumatriptan Succinate Other (See Comments)    Other reaction(s): delirium and hallucinations per [facility] records  . Dilaudid [Hydromorphone Hcl] Other (See Comments)    Delirium   . Ondansetron Other (See Comments)    hallucinations    . Zofran [Ondansetron Hcl] Other (See Comments)    Reaction:  hallucinations   . Codeine Nausea And Vomiting  . Gabapentin Other (See Comments)    Reaction:  Unknown    . [place] Nausea And Vomiting  . Losartan Nausea Only  . Oxycodone Anxiety  . Prochlorperazine Other (See Comments)    Reaction:  Unknown . Patient does not remember reaction but she does have vertigo and anxiety along with n and v at times. Could be used to treat any of these   . Reglan [Metoclopramide] Other (See Comments)    Per patient her Dr. Evelina Bucy her off it   . Scopolamine Other (See Comments)    Dizziness, also has vertigo already  . Tape Rash  .  Tapentadol Rash     REVIEW OF SYSTEMS (Negative unless checked)  Constitutional: [] Weight loss  [] Fever  [] Chills Cardiac: [] Chest pain   [] Chest pressure   []   Palpitations   [] Shortness of breath when laying flat   [] Shortness of breath with exertion. Vascular:  [] Pain in legs with walking   [] Pain in legs at rest  [] History of DVT   [] Phlebitis   [x] Swelling in legs   [] Varicose veins   [] Non-healing ulcers Pulmonary:   [] Uses home oxygen   [] Productive cough   [] Hemoptysis   [] Wheeze  [] COPD   [] Asthma Neurologic:  [] Dizziness   [] Seizures   [] History of stroke   [] History of TIA  [] Aphasia   [] Vissual changes   [] Weakness or numbness in arm   [] Weakness or numbness in leg Musculoskeletal:   [] Joint swelling   [] Joint pain   [] Low back pain Hematologic:  [] Easy bruising  [] Easy bleeding   [] Hypercoagulable state   [] Anemic Gastrointestinal:  [] Diarrhea   [] Vomiting  [] Gastroesophageal reflux/heartburn   [] Difficulty swallowing. Genitourinary:  [x] Chronic kidney disease   [] Difficult urination  [] Frequent urination   [] Blood in urine Skin:  [] Rashes   [] Ulcers  Psychological:  [] History of anxiety   []  History of major depression.  Physical Examination  Vitals:   09/30/18 1328  BP: (!) 145/52  Pulse: 73  Resp: 16  Weight: 189 lb 6.4 oz (85.9 kg)  Height: 5\' 5"  (1.651 m)   Body mass index is 31.52 kg/m. Gen: WD/WN, NAD Head: [place]/AT, No temporalis wasting.  Ear/Nose/Throat: Hearing grossly intact, nares w/o erythema or drainage Eyes: PER, EOMI, sclera nonicteric.  Neck: Supple, no large masses.   Pulmonary:  Good air movement, no audible wheezing bilaterally, no use of accessory muscles.  Cardiac: RRR, no JVD Vascular:  Left arm graft good thrill good bruit Vessel Right Left  Radial Palpable Palpable  Brachial Palpable Palpable  PT Trace Palpable Trace Palpable  DP 2+ Palpable 2+ Palpable  Gastrointestinal: Non-distended. No guarding/no peritoneal signs.  Musculoskeletal:  M/S 5/5 throughout.  No deformity or atrophy.  Neurologic: CN 2-12 intact. Symmetrical.  Speech is fluent. Motor exam as listed above. Psychiatric: Judgment intact, Mood & affect appropriate for pt's clinical situation. Dermatologic: mild venous rashes no ulcers noted.  No changes consistent with cellulitis. Lymph : No lichenification or skin changes of chronic lymphedema.  CBC Lab Results  Component Value Date   WBC 5.6 09/28/2018   HGB 10.3 (L) 09/28/2018   HCT 33.0 (L) 09/28/2018   MCV 92.4 09/28/2018   PLT 269 09/28/2018    BMET    Component Value Date/Time   NA 133 (L) 06/19/2018 1025   NA 135 (L) 09/15/2014 0948   K 5.1 06/19/2018 1025   K 4.8 09/15/2014 0948   CL 95 (L) 06/19/2018 1025   CL 99 09/15/2014 0948   CO2 27 06/19/2018 1025   CO2 26 09/15/2014 0948   GLUCOSE 183 (H) 06/19/2018 1025   GLUCOSE 118 (H) 09/15/2014 0948   BUN 45 (H) 06/19/2018 1025   BUN 19 (H) 09/15/2014 0948   CREATININE 6.34 (H) 06/19/2018 1025   CREATININE 6.79 (H) 09/15/2014 0948   CALCIUM 8.3 (L) 06/19/2018 1025   CALCIUM 8.3 (L) 09/15/2014 0948   GFRNONAA 6 (L) 06/19/2018 1025   GFRNONAA 7 (L) 09/15/2014 0948   GFRNONAA 6 (L) 05/31/2014 0432   GFRAA 8 (L) 06/19/2018 1025   GFRAA 8 (L) 09/15/2014 0948   GFRAA 7 (L) 05/31/2014 0432   CrCl cannot be calculated (Patient's most recent lab result is older than the maximum 21 days allowed.).  COAG Lab Results  Component Value Date   INR 0.93 09/30/2017  INR 1.08 04/21/2016   INR 0.97 12/28/2015    Radiology No results found.   Assessment/Plan 1. Complication of vascular access for dialysis, sequela Recommend:  The patient is doing well and currently has adequate dialysis access. The patient's dialysis center is not reporting any access issues. Flow pattern is stable when compared to the prior ultrasound.  The patient should have a duplex ultrasound of the dialysis access in 3 months. The patient will follow-up with me in  the office after each ultrasound   - VAS [place] (AVF, AVG); Future  2. Bilateral carotid artery stenosis Recommend:  Given the patient's asymptomatic subcritical stenosis no further invasive testing or surgery at this time.  Duplex ultrasound shows 50-69% stenosis bilaterally.  Continue antiplatelet therapy as prescribed Continue management of CAD, HTN and Hyperlipidemia Healthy heart diet,  encouraged exercise at least 4 times per week  Follow up every 12 months with duplex ultrasound and physical exam   3. Chronic venous insufficiency No surgery or intervention at this point in time.    I have reviewed my discussion with the patient regarding venous insufficiency and secondary lymph edema and why it  causes symptoms. I have discussed with the patient the chronic skin changes that accompany these problems and the long term sequela such as ulceration and infection.  Patient will continue wearing graduated compression stockings class 1 (20-30 mmHg) on a daily basis a prescription was given to the patient to keep this updated. The patient will  put the stockings on first thing in the morning and removing them in the evening. The patient is instructed specifically not to sleep in the stockings.  In addition, behavioral modification including elevation during the day will be continued.  Diet and salt restriction was also discussed.  Previous duplex ultrasound of the lower extremities shows normal deep venous system, superficial reflux was not present.   Following the review of the ultrasound the patient will follow up in 12 months to reassess the degree of swelling and the control that graduated compression is offering.   The patient can be assessed for a Lymph Pump at that time.  However, at this time the patient states they are satisfied with the control compression and elevation is yielding.    4. Lymphedema No surgery or intervention at this point in time.    I have  reviewed my discussion with the patient regarding venous insufficiency and secondary lymph edema and why it  causes symptoms. I have discussed with the patient the chronic skin changes that accompany these problems and the long term sequela such as ulceration and infection.  Patient will continue wearing graduated compression stockings class 1 (20-30 mmHg) on a daily basis a prescription was given to the patient to keep this updated. The patient will  put the stockings on first thing in the morning and removing them in the evening. The patient is instructed specifically not to sleep in the stockings.  In addition, behavioral modification including elevation during the day will be continued.  Diet and salt restriction was also discussed.  Previous duplex ultrasound of the lower extremities shows normal deep venous system, superficial reflux was not present.   Following the review of the ultrasound the patient will follow up in 12 months to reassess the degree of swelling and the control that graduated compression is offering.   The patient can be assessed for a Lymph Pump at that time.  However, at this time the patient states they are  satisfied with the control compression and elevation is yielding.    5. End-stage renal disease on hemodialysis ([place]) Continue dialysis without interruption   6. Essential hypertension Continue antihypertensive medications as already ordered, these medications have been reviewed and there are no changes at this time.   7. Coronary artery disease involving native coronary artery of native heart without angina pectoris Continue cardiac and antihypertensive medications as already ordered and reviewed, no changes at this time.  Continue statin as ordered and reviewed, no changes at this time  Nitrates PRN for chest pain     Hortencia Pilar, MD  09/30/2018 1:51 PM

## 2018-10-02 ENCOUNTER — Emergency Department: Payer: Medicare Other

## 2018-10-02 ENCOUNTER — Emergency Department
Admission: EM | Admit: 2018-10-02 | Discharge: 2018-10-02 | Disposition: A | Discharge status: Home / Self Care | Payer: Medicare Other | Attending: Emergency Medicine | Admitting: Emergency Medicine

## 2018-10-02 DIAGNOSIS — Z79899 Other long term (current) drug therapy: Secondary | ICD-10-CM | POA: Diagnosis not present

## 2018-10-02 DIAGNOSIS — E1122 Type 2 diabetes mellitus with diabetic chronic kidney disease: Secondary | ICD-10-CM | POA: Insufficient documentation

## 2018-10-02 DIAGNOSIS — N186 End stage renal disease: Secondary | ICD-10-CM | POA: Insufficient documentation

## 2018-10-02 DIAGNOSIS — Z7982 Long term (current) use of aspirin: Secondary | ICD-10-CM | POA: Diagnosis not present

## 2018-10-02 DIAGNOSIS — I110 Hypertensive heart disease with heart failure: Secondary | ICD-10-CM | POA: Insufficient documentation

## 2018-10-02 DIAGNOSIS — J449 Chronic obstructive pulmonary disease, unspecified: Secondary | ICD-10-CM | POA: Insufficient documentation

## 2018-10-02 DIAGNOSIS — I120 Hypertensive chronic kidney disease with stage 5 chronic kidney disease or end stage renal disease: Secondary | ICD-10-CM | POA: Diagnosis not present

## 2018-10-02 DIAGNOSIS — I5032 Chronic diastolic (congestive) heart failure: Secondary | ICD-10-CM | POA: Diagnosis not present

## 2018-10-02 DIAGNOSIS — R112 Nausea with vomiting, unspecified: Secondary | ICD-10-CM | POA: Diagnosis not present

## 2018-10-02 DIAGNOSIS — Z794 Long term (current) use of insulin: Secondary | ICD-10-CM | POA: Insufficient documentation

## 2018-10-02 DIAGNOSIS — Z992 Dependence on renal dialysis: Secondary | ICD-10-CM | POA: Insufficient documentation

## 2018-10-02 DIAGNOSIS — I2510 Atherosclerotic heart disease of native coronary artery without angina pectoris: Secondary | ICD-10-CM | POA: Insufficient documentation

## 2018-10-02 DIAGNOSIS — R110 Nausea: Secondary | ICD-10-CM | POA: Diagnosis present

## 2018-10-02 DIAGNOSIS — K529 Noninfective gastroenteritis and colitis, unspecified: Secondary | ICD-10-CM | POA: Diagnosis not present

## 2018-10-02 DIAGNOSIS — R197 Diarrhea, unspecified: Secondary | ICD-10-CM

## 2018-10-02 DIAGNOSIS — Z87891 Personal history of nicotine dependence: Secondary | ICD-10-CM | POA: Insufficient documentation

## 2018-10-02 LAB — CBC WITH DIFFERENTIAL/PLATELET
Abs Immature Granulocytes: 0.01 10*3/uL (ref 0.00–0.07)
Basophils Absolute: 0 10*3/uL (ref 0.0–0.1)
Basophils Relative: 0 %
Eosinophils Absolute: 0.3 10*3/uL (ref 0.0–0.5)
Eosinophils Relative: 6 %
HCT: 32.9 % — ABNORMAL LOW (ref 36.0–46.0)
Hemoglobin: 10.1 g/dL — ABNORMAL LOW (ref 12.0–15.0)
Immature Granulocytes: 0 %
Lymphocytes Relative: 17 %
Lymphs Abs: 0.9 10*3/uL (ref 0.7–4.0)
MCH: 29 pg (ref 26.0–34.0)
MCHC: 30.7 g/dL (ref 30.0–36.0)
MCV: 94.5 fL (ref 80.0–100.0)
Monocytes Absolute: 0.5 10*3/uL (ref 0.1–1.0)
Monocytes Relative: 9 %
Neutro Abs: 3.6 10*3/uL (ref 1.7–7.7)
Neutrophils Relative %: 68 %
Platelets: 189 10*3/uL (ref 150–400)
RBC: 3.48 MIL/uL — ABNORMAL LOW (ref 3.87–5.11)
RDW: 15.3 % (ref 11.5–15.5)
WBC: 5.3 10*3/uL (ref 4.0–10.5)
nRBC: 0 % (ref 0.0–0.2)

## 2018-10-02 LAB — COMPREHENSIVE METABOLIC PANEL
ALT: 15 U/L (ref 0–44)
AST: 24 U/L (ref 15–41)
Albumin: 4 g/dL (ref 3.5–5.0)
Alkaline Phosphatase: 136 U/L — ABNORMAL HIGH (ref 38–126)
Anion gap: 13 (ref 5–15)
BUN: 21 mg/dL — ABNORMAL HIGH (ref 6–20)
CO2: 24 mmol/L (ref 22–32)
Calcium: 8.4 mg/dL — ABNORMAL LOW (ref 8.9–10.3)
Chloride: 97 mmol/L — ABNORMAL LOW (ref 98–111)
Creatinine, Ser: 5.73 mg/dL — ABNORMAL HIGH (ref 0.44–1.00)
GFR calc Af Amer: 9 mL/min — ABNORMAL LOW (ref >60)
GFR calc non Af Amer: 7 mL/min — ABNORMAL LOW (ref >60)
Glucose, Bld: 221 mg/dL — ABNORMAL HIGH (ref 70–99)
Potassium: 4.3 mmol/L (ref 3.5–5.1)
Sodium: 134 mmol/L — ABNORMAL LOW (ref 135–145)
Total Bilirubin: 0.8 mg/dL (ref 0.3–1.2)
Total Protein: 8.3 g/dL — ABNORMAL HIGH (ref 6.5–8.1)

## 2018-10-02 LAB — LIPASE, BLOOD: Lipase: 45 U/L (ref 11–51)

## 2018-10-02 LAB — GLUCOSE, CAPILLARY: Glucose-Capillary: 200 mg/dL — ABNORMAL HIGH (ref 70–99)

## 2018-10-02 MED ORDER — DIPHENHYDRAMINE HCL 12.5 MG/5ML PO ELIX: 25.0000 mg | ORAL_SOLUTION | Freq: Once | ORAL | Status: DC

## 2018-10-02 MED ORDER — FENTANYL CITRATE (PF) 100 MCG/2ML IJ SOLN: 50.0000 ug | Freq: Once | INTRAMUSCULAR | Status: DC

## 2018-10-02 MED ORDER — SODIUM CHLORIDE 0.9 % IV BOLUS
500.0000 mL | Freq: Once | INTRAVENOUS | Status: AC
  Administered 2018-10-02: 500 mL via INTRAVENOUS

## 2018-10-02 MED ORDER — FENTANYL CITRATE (PF) 100 MCG/2ML IJ SOLN
75.0000 ug | Freq: Once | INTRAMUSCULAR | Status: AC
  Administered 2018-10-02: 75 ug via INTRAMUSCULAR
  Filled 2018-10-02: qty 2

## 2018-10-02 MED ORDER — METOCLOPRAMIDE HCL 5 MG/ML IJ SOLN
10.0000 mg | Freq: Once | INTRAMUSCULAR | Status: AC
  Administered 2018-10-02: 10 mg via INTRAVENOUS

## 2018-10-02 NOTE — ED Provider Notes (Signed)
[facility] Emergency Department Provider Note  ____________________________________________  Time seen: Approximately 12:56 PM  I have reviewed the triage vital signs and the nursing notes.   HISTORY  Chief Complaint Nausea   HPI [name] is a 58 y.o. female with a history of ESRD on HD (MWF), anemia, COPD, diastolic CHF, CAD, diabetes, hypertension presents for evaluation of abdominal pain vomiting and diarrhea.  Patient reports that she was up all night with vomiting and diarrhea.  No melena, coffee-ground emesis, hematochezia, or hematemesis.  She reports more than 10 episodes of vomiting and diarrhea.  She is complaining of diffuse severe cramping abdominal pain.  She reports having a fever at dialysis yesterday with a T-max of 52F.  No chills, no chest pain or shortness of breath, no URI symptoms.  Patient reports prior history of C. difficile.  Past Medical History:  Diagnosis Date  . Anemia   . Anginal pain ([place])   . Anxiety   . Arthritis   . Asthma   . Broken wrist   . Bronchitis   . chronic diastolic CHF 7/34/1937  . COPD (chronic obstructive pulmonary disease) ([place])   . Coronary artery disease    a. cath 2013: stenting to RCA (report not available); b. cath 2014: LM nl, pLAD 40%, mLAD nl, ost LCx 40%, mid LCx nl, pRCA 30% @ site of prior stent, mRCA 50%  . Depression   . Diabetes mellitus without complication ([place])   . Diabetic neuropathy ([place])   . dialysis 2006  . Diverticulosis   . Dizziness   . Dyspnea   . Elevated lipids   . Environmental and seasonal allergies   . ESRD (end stage renal disease) on dialysis ([place])    M-W-F  . GERD (gastroesophageal reflux disease)   . Headache   . History of hiatal hernia   . HOH (hard of hearing)   . Hx of pancreatitis 2015  . Hypertension   . Lower extremity edema   . Mitral regurgitation    a. echo 10/2013: EF 62%, noWMA, mildly dilated LA, mild to mod MR/TR, GR1DD  . Myocardial  infarction ([place])   . Orthopnea   . Pneumonia   . Renal cancer ([place])   . Renal insufficiency    Pt is on dialysis on M,W + F.  . Wheezing     Patient Active Problem List   Diagnosis Date Noted  . Symptomatic anemia 06/18/2018  . Gastroparesis due to DM ([place]) 01/18/2018  . Complication of vascular access for dialysis 12/04/2017  . Osteomyelitis ([place]) 09/30/2017  . Carotid stenosis 06/18/2017  . Shortness of breath 05/04/2017  . Cellulitis of lower extremity 07/29/2016  . Chronic venous insufficiency 07/29/2016  . Lymphedema 07/29/2016  . TIA (transient ischemic attack) 04/21/2016  . Altered mental status 04/08/2016  . Hyperammonemia ([place]) 04/08/2016  . Elevated troponin 04/08/2016  . Depression 04/08/2016  . Depression, major, recurrent, severe with psychosis ([place]) 04/08/2016  . Blood in stool   . Intractable cyclical vomiting with nausea   . Reflux esophagitis   . Gastritis   . Generalized abdominal pain   . Uncontrollable vomiting   . Major depressive disorder, recurrent episode, moderate ([place]) 03/15/2016  . Adjustment disorder with mixed anxiety and depressed mood 03/15/2016  . Malnutrition of moderate degree 12/01/2015  . Renal mass   . Dyspnea   . Acute renal failure ([place])   . Respiratory failure ([place])   . High temperature 11/14/2015  . Pulmonary  edema   . Encounter for central line placement   . Encounter for orogastric (OG) tube placement   . Nausea 11/12/2015  . Hyperkalemia 10/03/2015  . Diarrhea, unspecified 07/22/2015  . Pneumonia 05/21/2015  . Hypoglycemia 04/24/2015  . Unresponsiveness 04/24/2015  . Bradycardia 04/24/2015  . Hypothermia 04/24/2015  . Acute respiratory failure ([place]) 04/24/2015  . Acute diastolic CHF (congestive heart failure) ([place]) 04/05/2015  . Diabetic gastroparesis ([place]) 04/05/2015  . Hypokalemia 04/05/2015  . Generalized weakness 04/05/2015  . Acute pulmonary edema ([place]) 04/03/2015  . Nausea and vomiting 04/03/2015  .  Hypoglycemia associated with diabetes ([place]) 04/03/2015  . Anemia of chronic disease 04/03/2015  . Secondary hyperparathyroidism ([place]) 04/03/2015  . Pressure ulcer 04/02/2015  . Acute respiratory failure with hypoxia ([place]) 04/01/2015  . Adjustment disorder with anxiety 03/14/2015  . Somatic symptom disorder, mild 03/08/2015  . Coronary artery disease involving native coronary artery of native heart without angina pectoris   . Nausea & vomiting 03/06/2015  . Abdominal pain 03/06/2015  . DM (diabetes mellitus) ([place]) 03/06/2015  . HTN (hypertension) 03/06/2015  . Gastroparesis 02/24/2015  . Pleural effusion 02/19/2015  . HCAP (healthcare-associated pneumonia) 02/19/2015  . End-stage renal disease on hemodialysis ([place]) 02/19/2015    Past Surgical History:  Procedure Laterality Date  . A/V SHUNTOGRAM Left 01/20/2018   Procedure: A/V SHUNTOGRAM;  Surgeon: Algernon Huxley, MD;  Location: [place] [facility];  Service: Cardiovascular;  Laterality: Left;  . ABDOMINAL HYSTERECTOMY  1992  . AMPUTATION TOE Left 10/02/2017   Procedure: AMPUTATION TOE-LEFT GREAT TOE;  Surgeon: Albertine Patricia, DPM;  Location: [facility] ORS;  Service: Podiatry;  Laterality: Left;  . APPENDECTOMY    . CARDIAC CATHETERIZATION Left 07/26/2015   Procedure: Left Heart Cath and Coronary Angiography;  Surgeon: Dionisio David, MD;  Location: [place] [facility];  Service: Cardiovascular;  Laterality: Left;  . CATARACT EXTRACTION W/ INTRAOCULAR LENS IMPLANT Right   . CATARACT EXTRACTION W/PHACO Left 03/10/2017   Procedure: CATARACT EXTRACTION PHACO AND INTRAOCULAR LENS PLACEMENT (IOC);  Surgeon: Birder Robson, MD;  Location: [facility] ORS;  Service: Ophthalmology;  Laterality: Left;  [place] 00:51.9 AP% 14.2 CDE 7.39 fluid pack lot # [id number]  . CHOLECYSTECTOMY    . COLONOSCOPY WITH PROPOFOL N/A 08/12/2016   Procedure: COLONOSCOPY WITH PROPOFOL;  Surgeon: Lollie Sails, MD;  Location: [facility] ENDOSCOPY;  Service: Endoscopy;  Laterality:  N/A;  . DIALYSIS FISTULA CREATION Left    upper arm  . dialysis grafts    . ESOPHAGOGASTRODUODENOSCOPY N/A 03/08/2015   Procedure: ESOPHAGOGASTRODUODENOSCOPY (EGD);  Surgeon: Manya Silvas, MD;  Location: [facility] ENDOSCOPY;  Service: Endoscopy;  Laterality: N/A;  . ESOPHAGOGASTRODUODENOSCOPY (EGD) WITH PROPOFOL N/A 03/18/2016   Procedure: ESOPHAGOGASTRODUODENOSCOPY (EGD) WITH PROPOFOL;  Surgeon: [name], MD;  Location: [facility] ENDOSCOPY;  Service: Endoscopy;  Laterality: N/A;  . EYE SURGERY Right 2018  . FECAL TRANSPLANT N/A 08/23/2015   Procedure: FECAL TRANSPLANT;  Surgeon: Manya Silvas, MD;  Location: [facility] ENDOSCOPY;  Service: Endoscopy;  Laterality: N/A;  . HAND SURGERY Bilateral   . PERIPHERAL VASCULAR CATHETERIZATION N/A 12/20/2015   Procedure: Thrombectomy of dialysis access versus permcath placement;  Surgeon: Algernon Huxley, MD;  Location: [place] [facility];  Service: Cardiovascular;  Laterality: N/A;  . PERIPHERAL VASCULAR CATHETERIZATION N/A 12/20/2015   Procedure: A/V Shunt Intervention;  Surgeon: Algernon Huxley, MD;  Location: [place] [facility];  Service: Cardiovascular;  Laterality: N/A;  . PERIPHERAL VASCULAR CATHETERIZATION N/A 12/20/2015   Procedure: A/V  Shuntogram/Fistulagram;  Surgeon: Algernon Huxley, MD;  Location: [place] [facility];  Service: Cardiovascular;  Laterality: N/A;  . PERIPHERAL VASCULAR CATHETERIZATION N/A 01/02/2016   Procedure: A/V Shuntogram/Fistulagram;  Surgeon: Algernon Huxley, MD;  Location: [place] [facility] LAB;  Service: Cardiovascular;  Laterality: N/A;  . PERIPHERAL VASCULAR CATHETERIZATION N/A 01/02/2016   Procedure: A/V Shunt Intervention;  Surgeon: Algernon Huxley, MD;  Location: [place] [facility];  Service: Cardiovascular;  Laterality: N/A;    Prior to Admission medications   Medication Sig Start Date End Date Taking? Authorizing Provider  albuterol (PROVENTIL HFA;VENTOLIN HFA) 108 (90 Base) MCG/ACT inhaler Inhale 2 puffs into the lungs every 6 (six)  hours as needed for wheezing or shortness of breath. 02/17/18   Merlyn Lot, MD  albuterol (PROVENTIL) (2.5 MG/3ML) 0.083% nebulizer solution Take 2.5 mg by nebulization every 4 (four) hours as needed for wheezing or shortness of breath.    [provider]  amLODipine (NORVASC) 10 MG tablet Take 10 mg by mouth daily.    [provider]  aspirin EC 81 MG tablet Take 81 mg by mouth daily. 09/08/16   [provider]  atorvastatin (LIPITOR) 20 MG tablet Take 20 mg by mouth every evening.  12/02/16   [provider]  Biotin 1 MG CAPS Take 1 mg by mouth daily.    [provider]  cetirizine (ZYRTEC) 10 MG tablet Take 10 mg by mouth daily.     [provider]  cholecalciferol (VITAMIN D) 400 units TABS tablet Take 400 Units by mouth daily.    [provider]  cyanocobalamin 1000 MCG tablet Take 1,000 mcg by mouth daily.     [provider]  diclofenac sodium (VOLTAREN) 1 % GEL Apply 1 application topically daily as needed (for rash).    [provider]  ferrous sulfate (CVS IRON) 325 (65 FE) MG tablet Take 325 mg by mouth daily with breakfast.    [provider]  FLUoxetine (PROZAC) 20 MG capsule Take 1 capsule (20 mg total) by mouth daily. Patient taking differently: Take 20 mg by mouth at bedtime.  04/10/16   Hower, Aaron Mose, MD  hydrocortisone cream 0.5 % Apply topically 3 (three) times daily. 01/21/18   Wieting, Richard, MD  insulin aspart (NOVOLOG FLEXPEN) 100 UNIT/ML FlexPen Inject 4-15 Units into the skin 4 (four) times daily as needed for high blood sugar (> 100, per sliding scale).    [provider]  lidocaine (LIDODERM) 5 % Place 1 patch onto the skin every 12 (twelve) hours. Remove & Discard patch within 12 hours or as directed by MD 02/20/18 02/20/19  Nance Pear, MD  lipase/protease/amylase (CREON) 12000 UNITS CPEP capsule Take 36000 mg in the morning, 24000 midday & 36000 in the evening     [provider]  LORazepam (ATIVAN PO) Take by mouth.    [provider]  multivitamin (RENA-VIT) TABS tablet Take 1 tablet by mouth daily.     [provider]  nitroGLYCERIN (NITROSTAT) 0.4 MG SL tablet Place 0.4 mg under the tongue every 5 (five) minutes as needed for chest pain.     [provider]  ranitidine (ZANTAC) 300 MG capsule Take 1 capsule by mouth every evening. 01/19/18   [provider]  sevelamer carbonate (RENVELA) 800 MG tablet Take 800-2,400 mg by mouth See admin instructions. Take 2400 mg by mouth 3 times daily with meals and take 800 mg by mouth with snacks  [provider]    Allergies Ace inhibitors; Ativan [lorazepam]; Compazine [prochlorperazine edisylate]; Sumatriptan succinate; Dilaudid [hydromorphone hcl]; Ondansetron; Zofran [ondansetron hcl]; Codeine; Gabapentin; [place]; Losartan; Oxycodone; Prochlorperazine; Reglan [metoclopramide]; Scopolamine; Tape; and Tapentadol  Family History  Problem Relation Age of Onset  . Kidney disease Mother   . Diabetes Mother   . Cancer Father   . Kidney disease Sister     Social History Social History   Tobacco Use  . Smoking status: Former Smoker    Packs/day: 0.50    Years: 40.00    Pack years: 20.00    Types: Cigarettes    Last attempt to quit: 02/13/2015    Years since quitting: 3.6  . Smokeless tobacco: Never Used  Substance Use Topics  . Alcohol use: Not Currently    Comment: glass wine week per pt  . Drug use: Yes    Types: Marijuana    Comment: once a day    Review of Systems  Constitutional: Negative for fever. + chills Eyes: Negative for visual changes. ENT: Negative for sore throat. Neck: No neck pain  Cardiovascular: Negative for chest pain. Respiratory: Negative for shortness of breath. Gastrointestinal: + cramping abdominal pain, vomiting and diarrhea. Genitourinary: Negative for dysuria. Musculoskeletal: Negative for back pain. Skin:  Negative for rash. Neurological: Negative for headaches, weakness or numbness. Psych: No SI or HI  ____________________________________________   PHYSICAL EXAM:  VITAL SIGNS: ED Triage Vitals [10/02/18 1149]  Enc Vitals Group     BP (!) 122/59     Pulse Rate 74     Resp      Temp (!) 97.5 F (36.4 C)     Temp Source Oral     SpO2 96 %     Weight 189 lb 6 oz (85.9 kg)     Height      Head Circumference      Peak Flow      Pain Score 10     Pain Loc      Pain Edu?      Excl. in [place]?     Constitutional: Alert and oriented, patient is obviously uncomfortable due to pain  HEENT:      Head: Normocephalic and atraumatic.         Eyes: Conjunctivae are normal. Sclera is non-icteric.       Mouth/Throat: Mucous membranes are moist.       Neck: Supple with no signs of meningismus. Cardiovascular: Regular rate and rhythm. No murmurs, gallops, or rubs. 2+ symmetrical distal pulses are present in all extremities. No JVD. Respiratory: Normal respiratory effort. Lungs are clear to auscultation bilaterally. No wheezes, crackles, or rhonchi.  Gastrointestinal: Soft, diffuse tenderness to palpation, and non distended with positive bowel sounds. No rebound or guarding. Musculoskeletal: Nontender with normal range of motion in all extremities. No edema, cyanosis, or erythema of extremities. Neurologic: Normal speech and language. Face is symmetric. Moving all extremities. No gross focal neurologic deficits are appreciated. Skin: Skin is warm, dry and intact. No rash noted. Psychiatric: Mood and affect are normal. Speech and behavior are normal.  ____________________________________________   LABS (all labs ordered are listed, but only abnormal results are displayed)  Labs Reviewed  COMPREHENSIVE METABOLIC PANEL - Abnormal; Notable for the following components:      Result Value   Sodium 134 (*)    Chloride 97 (*)    Glucose, Bld 221 (*)    BUN 21 (*)    Creatinine, Ser 5.73 (*)  Calcium 8.4 (*)    Total Protein 8.3 (*)    Alkaline Phosphatase 136 (*)    GFR calc non Af Amer 7 (*)    GFR calc Af Amer 9 (*)    All other components within normal limits  GLUCOSE, CAPILLARY - Abnormal; Notable for the following components:   Glucose-Capillary 200 (*)    All other components within normal limits  CBC WITH DIFFERENTIAL/PLATELET - Abnormal; Notable for the following components:   RBC 3.48 (*)    Hemoglobin 10.1 (*)    HCT 32.9 (*)    All other components within normal limits  C DIFFICILE QUICK SCREEN W PCR REFLEX  LIPASE, BLOOD  URINALYSIS, COMPLETE (UACMP) WITH MICROSCOPIC   ____________________________________________  EKG  ED ECG REPORT I, Rudene Re, the attending physician, personally viewed and interpreted this ECG.  Normal sinus rhythm rate of 72, normal intervals, normal axis, no ST elevations or depressions.  Normal EKG.  Unchanged from prior. ____________________________________________  RADIOLOGY   I have personally reviewed the images performed during this visit and I agree with the Radiologist's read.   Interpretation by Radiologist:  Ct Abdomen Pelvis Wo Contrast  Result Date: 10/02/2018 CLINICAL DATA:  Generalized abdominal pain with nausea, vomiting, and diarrhea. EXAM: CT ABDOMEN AND PELVIS WITHOUT CONTRAST TECHNIQUE: Multidetector CT imaging of the abdomen and pelvis was performed following the standard protocol without IV contrast. COMPARISON:  CT abdomen dated March 02, 2018. CT abdomen pelvis dated January 17, 2018. FINDINGS: Lower chest: No acute abnormality. Mild subsegmental atelectasis at the lung bases. Unchanged trace right pleural effusion. Unchanged mild cardiomegaly. Hepatobiliary: No focal liver abnormality is seen. No gallstones, gallbladder wall thickening, or biliary dilatation. Pancreas: Unremarkable. No pancreatic ductal dilatation or surrounding inflammatory changes. Spleen: Normal in size without focal abnormality.  Adrenals/Urinary Tract: The adrenal glands are unremarkable. Unchanged severe bilateral renal atrophy. Known small right renal masses are not well evaluated without intravenous contrast. No renal calculi or hydronephrosis. The bladder is unremarkable. Stomach/Bowel: Stomach is within normal limits. Appendix is surgically absent. No evidence of bowel wall thickening, distention, or inflammatory changes. Mild colonic diverticulosis. Vascular/Lymphatic: Aortic atherosclerosis. No enlarged abdominal or pelvic lymph nodes. Reproductive: Status post hysterectomy. No adnexal masses. Other: No abdominal wall hernia or abnormality. No abdominopelvic ascites. No pneumoperitoneum. Musculoskeletal: No acute or significant osseous findings. IMPRESSION: 1.  No acute intra-abdominal process. 2. Unchanged severe bilateral renal atrophy. Known small right renal masses are not well evaluated without intravenous contrast. 3. Unchanged trace right pleural effusion. 4.  Aortic atherosclerosis (ICD10-I70.0). Electronically Signed   By: Titus Dubin M.D.   On: 10/02/2018 16:04    ____________________________________________   PROCEDURES  Procedure(s) performed: None Procedures Critical Care performed:  None ____________________________________________   INITIAL IMPRESSION / ASSESSMENT AND PLAN / ED COURSE  58 y.o. female with a history of ESRD on HD (MWF), anemia, COPD, diastolic CHF, CAD, diabetes, hypertension presents for evaluation of diffuse cramping abdominal pain, vomiting, and diarrhea overnight.  Patient looks uncomfortable but in no obvious distress, she has normal vital signs, abdomen is soft with diffuse tenderness.  Ddx: C. difficile versus colitis versus viral gastroenteritis.  Less likely flu with no fever upper respiratory symptoms. No localizing tenderness rebound or guarding.  Will check labs, send stool for C. difficile.  Will give gentle hydration, fentanyl for pain and Reglan for nausea  vomiting.    _________________________ 3:02 PM on 10/02/2018 -----------------------------------------  Labs within normal limits.  No anion gap acidosis, no indication  for emergent dialysis, stable hemoglobin, no leukocytosis.  Patient has been observed in the emergency room for over 3 hours and no further episodes of vomiting or diarrhea. Patient has been sleeping peacefully with no further episodes of vomiting diarrhea.  After I woke her up she started complaining of severe abdominal pain again.  We will send patient for CT abdomen pelvis.   _________________________ 4:24 PM on 10/02/2018 -----------------------------------------  CT negative for intra-abdominal pathology.  Patient remains with no episodes of vomiting or diarrhea.  Unable to provide stool sample in spite of being in the emergency room for 5 hours.  Therefore low suspicion for C. difficile.  Most likely gastroenteritis.  Patient be discharged home on supportive care follow-up with primary care doctor.  As part of my medical decision making, I reviewed the following data within the [place] notes reviewed and incorporated, Labs reviewed , Old chart reviewed, Radiograph reviewed , Notes from prior ED visits and [place] Controlled Substance Database    Pertinent labs & imaging results that were available during my care of the patient were reviewed by me and considered in my medical decision making (see chart for details).    ____________________________________________   FINAL CLINICAL IMPRESSION(S) / ED DIAGNOSES  Final diagnoses:  Nausea vomiting and diarrhea  Gastroenteritis      NEW MEDICATIONS STARTED DURING THIS VISIT:  ED Discharge Orders    None       Note:  This document was prepared using Dragon voice recognition software and may include unintentional dictation errors.    Alfred Levins, [place], MD 10/02/18 [phone]

## 2018-10-02 NOTE — ED Notes (Signed)
Patient transported to CT 

## 2018-10-02 NOTE — ED Notes (Signed)
.   Pt is resting, Respirations even and unlabored, NAD. Stretcher lowest postion and locked. Call bell within reach. Denies any needs at this time RN will continue to monitor.    

## 2018-10-02 NOTE — ED Triage Notes (Signed)
arrives via [facility] from home for NVD started last night  nothing able to hold down. Pt was diaylsis yesterday full treatment. Pt was given 3 doses of zithromyocin she has had 1 dose for tx opf PNA. Pt states she has not had any home meds today   CBG-230 Hx:  DM HTN MI  Kidney failure

## 2018-10-02 NOTE — ED Notes (Signed)
Pt placed on bedpan but unable to provide stool sample at this time. Peri-care performed and pt repositioned in bed. MD notified.

## 2018-10-04 ENCOUNTER — Emergency Department: Payer: Medicare Other

## 2018-10-04 ENCOUNTER — Inpatient Hospital Stay
Admission: EM | Admit: 2018-10-04 | Discharge: 2018-10-11 | DRG: 515 | Disposition: A | Discharge status: Home Health | Payer: Medicare Other | Attending: Internal Medicine | Admitting: Internal Medicine

## 2018-10-04 ENCOUNTER — Encounter: Payer: Self-pay | Admitting: Medical Oncology

## 2018-10-04 ENCOUNTER — Other Ambulatory Visit: Payer: Self-pay

## 2018-10-04 DIAGNOSIS — I959 Hypotension, unspecified: Secondary | ICD-10-CM | POA: Diagnosis present

## 2018-10-04 DIAGNOSIS — K219 Gastro-esophageal reflux disease without esophagitis: Secondary | ICD-10-CM | POA: Diagnosis present

## 2018-10-04 DIAGNOSIS — Z9841 Cataract extraction status, right eye: Secondary | ICD-10-CM

## 2018-10-04 DIAGNOSIS — Z91048 Other nonmedicinal substance allergy status: Secondary | ICD-10-CM

## 2018-10-04 DIAGNOSIS — J302 Other seasonal allergic rhinitis: Secondary | ICD-10-CM | POA: Diagnosis present

## 2018-10-04 DIAGNOSIS — E1151 Type 2 diabetes mellitus with diabetic peripheral angiopathy without gangrene: Secondary | ICD-10-CM | POA: Diagnosis present

## 2018-10-04 DIAGNOSIS — R05 Cough: Secondary | ICD-10-CM

## 2018-10-04 DIAGNOSIS — Z955 Presence of coronary angioplasty implant and graft: Secondary | ICD-10-CM

## 2018-10-04 DIAGNOSIS — Z841 Family history of disorders of kidney and ureter: Secondary | ICD-10-CM

## 2018-10-04 DIAGNOSIS — E1122 Type 2 diabetes mellitus with diabetic chronic kidney disease: Secondary | ICD-10-CM | POA: Diagnosis present

## 2018-10-04 DIAGNOSIS — M316 Other giant cell arteritis: Secondary | ICD-10-CM | POA: Diagnosis not present

## 2018-10-04 DIAGNOSIS — Z833 Family history of diabetes mellitus: Secondary | ICD-10-CM

## 2018-10-04 DIAGNOSIS — M1990 Unspecified osteoarthritis, unspecified site: Secondary | ICD-10-CM | POA: Diagnosis present

## 2018-10-04 DIAGNOSIS — R51 Headache: Secondary | ICD-10-CM | POA: Diagnosis present

## 2018-10-04 DIAGNOSIS — R059 Cough, unspecified: Secondary | ICD-10-CM

## 2018-10-04 DIAGNOSIS — E11319 Type 2 diabetes mellitus with unspecified diabetic retinopathy without macular edema: Secondary | ICD-10-CM | POA: Diagnosis present

## 2018-10-04 DIAGNOSIS — I252 Old myocardial infarction: Secondary | ICD-10-CM

## 2018-10-04 DIAGNOSIS — R519 Headache, unspecified: Secondary | ICD-10-CM

## 2018-10-04 DIAGNOSIS — Z87891 Personal history of nicotine dependence: Secondary | ICD-10-CM

## 2018-10-04 DIAGNOSIS — T380X5A Adverse effect of glucocorticoids and synthetic analogues, initial encounter: Secondary | ICD-10-CM | POA: Diagnosis not present

## 2018-10-04 DIAGNOSIS — E1143 Type 2 diabetes mellitus with diabetic autonomic (poly)neuropathy: Secondary | ICD-10-CM | POA: Diagnosis present

## 2018-10-04 DIAGNOSIS — Z79899 Other long term (current) drug therapy: Secondary | ICD-10-CM

## 2018-10-04 DIAGNOSIS — I340 Nonrheumatic mitral (valve) insufficiency: Secondary | ICD-10-CM | POA: Diagnosis present

## 2018-10-04 DIAGNOSIS — D631 Anemia in chronic kidney disease: Secondary | ICD-10-CM | POA: Diagnosis present

## 2018-10-04 DIAGNOSIS — F329 Major depressive disorder, single episode, unspecified: Secondary | ICD-10-CM | POA: Diagnosis present

## 2018-10-04 DIAGNOSIS — I6523 Occlusion and stenosis of bilateral carotid arteries: Secondary | ICD-10-CM

## 2018-10-04 DIAGNOSIS — I5032 Chronic diastolic (congestive) heart failure: Secondary | ICD-10-CM | POA: Diagnosis present

## 2018-10-04 DIAGNOSIS — N2889 Other specified disorders of kidney and ureter: Secondary | ICD-10-CM | POA: Diagnosis present

## 2018-10-04 DIAGNOSIS — R55 Syncope and collapse: Secondary | ICD-10-CM

## 2018-10-04 DIAGNOSIS — I132 Hypertensive heart and chronic kidney disease with heart failure and with stage 5 chronic kidney disease, or end stage renal disease: Secondary | ICD-10-CM | POA: Diagnosis present

## 2018-10-04 DIAGNOSIS — R531 Weakness: Secondary | ICD-10-CM

## 2018-10-04 DIAGNOSIS — Z9842 Cataract extraction status, left eye: Secondary | ICD-10-CM

## 2018-10-04 DIAGNOSIS — N186 End stage renal disease: Secondary | ICD-10-CM | POA: Diagnosis present

## 2018-10-04 DIAGNOSIS — J441 Chronic obstructive pulmonary disease with (acute) exacerbation: Secondary | ICD-10-CM | POA: Diagnosis present

## 2018-10-04 DIAGNOSIS — F411 Generalized anxiety disorder: Secondary | ICD-10-CM | POA: Diagnosis present

## 2018-10-04 DIAGNOSIS — E1165 Type 2 diabetes mellitus with hyperglycemia: Secondary | ICD-10-CM | POA: Diagnosis present

## 2018-10-04 DIAGNOSIS — Z794 Long term (current) use of insulin: Secondary | ICD-10-CM

## 2018-10-04 DIAGNOSIS — H9190 Unspecified hearing loss, unspecified ear: Secondary | ICD-10-CM | POA: Diagnosis present

## 2018-10-04 DIAGNOSIS — K8689 Other specified diseases of pancreas: Secondary | ICD-10-CM | POA: Diagnosis present

## 2018-10-04 DIAGNOSIS — J9601 Acute respiratory failure with hypoxia: Secondary | ICD-10-CM | POA: Diagnosis present

## 2018-10-04 DIAGNOSIS — Z961 Presence of intraocular lens: Secondary | ICD-10-CM | POA: Diagnosis present

## 2018-10-04 DIAGNOSIS — K449 Diaphragmatic hernia without obstruction or gangrene: Secondary | ICD-10-CM | POA: Diagnosis present

## 2018-10-04 DIAGNOSIS — R404 Transient alteration of awareness: Secondary | ICD-10-CM | POA: Diagnosis not present

## 2018-10-04 DIAGNOSIS — H8309 Labyrinthitis, unspecified ear: Secondary | ICD-10-CM | POA: Diagnosis present

## 2018-10-04 DIAGNOSIS — K3184 Gastroparesis: Secondary | ICD-10-CM | POA: Diagnosis present

## 2018-10-04 DIAGNOSIS — Z85528 Personal history of other malignant neoplasm of kidney: Secondary | ICD-10-CM

## 2018-10-04 DIAGNOSIS — Z7982 Long term (current) use of aspirin: Secondary | ICD-10-CM

## 2018-10-04 DIAGNOSIS — I2510 Atherosclerotic heart disease of native coronary artery without angina pectoris: Secondary | ICD-10-CM | POA: Diagnosis present

## 2018-10-04 DIAGNOSIS — N2581 Secondary hyperparathyroidism of renal origin: Secondary | ICD-10-CM | POA: Diagnosis present

## 2018-10-04 DIAGNOSIS — Z888 Allergy status to other drugs, medicaments and biological substances status: Secondary | ICD-10-CM

## 2018-10-04 DIAGNOSIS — J449 Chronic obstructive pulmonary disease, unspecified: Secondary | ICD-10-CM | POA: Diagnosis present

## 2018-10-04 DIAGNOSIS — Z992 Dependence on renal dialysis: Secondary | ICD-10-CM

## 2018-10-04 DIAGNOSIS — Z885 Allergy status to narcotic agent status: Secondary | ICD-10-CM

## 2018-10-04 DIAGNOSIS — E782 Mixed hyperlipidemia: Secondary | ICD-10-CM | POA: Diagnosis present

## 2018-10-04 LAB — CBC WITH DIFFERENTIAL/PLATELET
Abs Immature Granulocytes: 0.03 10*3/uL (ref 0.00–0.07)
Basophils Absolute: 0 10*3/uL (ref 0.0–0.1)
Basophils Relative: 0 %
Eosinophils Absolute: 0.2 10*3/uL (ref 0.0–0.5)
Eosinophils Relative: 4 %
HCT: 30.7 % — ABNORMAL LOW (ref 36.0–46.0)
Hemoglobin: 9.7 g/dL — ABNORMAL LOW (ref 12.0–15.0)
Immature Granulocytes: 1 %
Lymphocytes Relative: 34 %
Lymphs Abs: 1.6 10*3/uL (ref 0.7–4.0)
MCH: 29.6 pg (ref 26.0–34.0)
MCHC: 31.6 g/dL (ref 30.0–36.0)
MCV: 93.6 fL (ref 80.0–100.0)
Monocytes Absolute: 0.4 10*3/uL (ref 0.1–1.0)
Monocytes Relative: 8 %
Neutro Abs: 2.6 10*3/uL (ref 1.7–7.7)
Neutrophils Relative %: 53 %
Platelets: 209 10*3/uL (ref 150–400)
RBC: 3.28 MIL/uL — ABNORMAL LOW (ref 3.87–5.11)
RDW: 15.8 % — ABNORMAL HIGH (ref 11.5–15.5)
Smear Review: NORMAL
WBC: 4.8 10*3/uL (ref 4.0–10.5)
nRBC: 0 % (ref 0.0–0.2)

## 2018-10-04 LAB — SALICYLATE LEVEL: Salicylate Lvl: <7 mg/dL (ref 2.8–30.0)

## 2018-10-04 LAB — COMPREHENSIVE METABOLIC PANEL
ALT: 15 U/L (ref 0–44)
AST: 25 U/L (ref 15–41)
Albumin: 3.6 g/dL (ref 3.5–5.0)
Alkaline Phosphatase: 112 U/L (ref 38–126)
Anion gap: 14 (ref 5–15)
BUN: 38 mg/dL — ABNORMAL HIGH (ref 6–20)
CO2: 23 mmol/L (ref 22–32)
Calcium: 8.1 mg/dL — ABNORMAL LOW (ref 8.9–10.3)
Chloride: 98 mmol/L (ref 98–111)
Creatinine, Ser: 9.04 mg/dL — ABNORMAL HIGH (ref 0.44–1.00)
GFR calc Af Amer: 5 mL/min — ABNORMAL LOW (ref >60)
GFR calc non Af Amer: 4 mL/min — ABNORMAL LOW (ref >60)
Glucose, Bld: 159 mg/dL — ABNORMAL HIGH (ref 70–99)
Potassium: 4.6 mmol/L (ref 3.5–5.1)
Sodium: 135 mmol/L (ref 135–145)
Total Bilirubin: 0.9 mg/dL (ref 0.3–1.2)
Total Protein: 7.5 g/dL (ref 6.5–8.1)

## 2018-10-04 LAB — GLUCOSE, CAPILLARY
Glucose-Capillary: 142 mg/dL — ABNORMAL HIGH (ref 70–99)
Glucose-Capillary: 116 mg/dL — ABNORMAL HIGH (ref 70–99)
Glucose-Capillary: 100 mg/dL — ABNORMAL HIGH (ref 70–99)
Glucose-Capillary: 119 mg/dL — ABNORMAL HIGH (ref 70–99)

## 2018-10-04 LAB — TSH: TSH: 1.385 u[IU]/mL (ref 0.350–4.500)

## 2018-10-04 LAB — LACTIC ACID, PLASMA: Lactic Acid, Venous: 0.8 mmol/L (ref 0.5–1.9)

## 2018-10-04 LAB — MRSA PCR SCREENING: MRSA by PCR: NEGATIVE

## 2018-10-04 LAB — LIPASE, BLOOD: Lipase: 83 U/L — ABNORMAL HIGH (ref 11–51)

## 2018-10-04 LAB — INFLUENZA PANEL BY PCR (TYPE A & B)
Influenza A By PCR: NEGATIVE
Influenza B By PCR: NEGATIVE

## 2018-10-04 LAB — BRAIN NATRIURETIC PEPTIDE: B Natriuretic Peptide: 348 pg/mL — ABNORMAL HIGH (ref 0.0–100.0)

## 2018-10-04 LAB — ACETAMINOPHEN LEVEL: Acetaminophen (Tylenol), Serum: <10 ug/mL — ABNORMAL LOW (ref 10–30)

## 2018-10-04 LAB — SEDIMENTATION RATE: Sed Rate: 83 mm/hr — ABNORMAL HIGH (ref 0–30)

## 2018-10-04 LAB — TROPONIN I
Troponin I: <0.03 ng/mL (ref <0.03)
Troponin I: <0.03 ng/mL (ref <0.03)

## 2018-10-04 LAB — AMMONIA: Ammonia: 17 umol/L (ref 9–35)

## 2018-10-04 LAB — ETHANOL: Alcohol, Ethyl (B): <10 mg/dL (ref <10)

## 2018-10-04 MED ORDER — SEVELAMER CARBONATE 800 MG PO TABS
2400.0000 mg | ORAL_TABLET | Freq: Three times a day (TID) | ORAL | Status: DC
  Administered 2018-10-04 – 2018-10-11 (×16): 2400 mg via ORAL
  Filled 2018-10-04 (×17): qty 3

## 2018-10-04 MED ORDER — FAMOTIDINE 20 MG PO TABS
20.0000 mg | ORAL_TABLET | Freq: Every day | ORAL | Status: DC
  Administered 2018-10-04 – 2018-10-11 (×8): 20 mg via ORAL
  Filled 2018-10-04 (×8): qty 1

## 2018-10-04 MED ORDER — SODIUM CHLORIDE 0.9% FLUSH
3.0000 mL | Freq: Two times a day (BID) | INTRAVENOUS | Status: DC
  Administered 2018-10-04 – 2018-10-11 (×12): 3 mL via INTRAVENOUS

## 2018-10-04 MED ORDER — PROMETHAZINE HCL 25 MG/ML IJ SOLN: 25.0000 mg | Freq: Four times a day (QID) | INTRAMUSCULAR | Status: DC | PRN

## 2018-10-04 MED ORDER — HYDROCODONE-ACETAMINOPHEN 5-325 MG PO TABS
1.0000 | ORAL_TABLET | Freq: Four times a day (QID) | ORAL | Status: DC | PRN
  Administered 2018-10-04 – 2018-10-09 (×10): 1 via ORAL
  Filled 2018-10-04 (×12): qty 1

## 2018-10-04 MED ORDER — ALBUTEROL SULFATE (2.5 MG/3ML) 0.083% IN NEBU
2.5000 mg | INHALATION_SOLUTION | Freq: Four times a day (QID) | RESPIRATORY_TRACT | Status: DC | PRN
  Administered 2018-10-06 – 2018-10-07 (×2): 2.5 mg via RESPIRATORY_TRACT
  Filled 2018-10-04 (×2): qty 3

## 2018-10-04 MED ORDER — PANCRELIPASE (LIP-PROT-AMYL) 12000-38000 UNITS PO CPEP
36000.0000 [IU] | ORAL_CAPSULE | Freq: Two times a day (BID) | ORAL | Status: DC
  Administered 2018-10-04 – 2018-10-11 (×12): 36000 [IU] via ORAL
  Filled 2018-10-04 (×19): qty 3

## 2018-10-04 MED ORDER — FLUOXETINE HCL 20 MG PO CAPS
20.0000 mg | ORAL_CAPSULE | Freq: Every day | ORAL | Status: DC
  Administered 2018-10-04 – 2018-10-11 (×8): 20 mg via ORAL
  Filled 2018-10-04 (×8): qty 1

## 2018-10-04 MED ORDER — SODIUM CHLORIDE 0.9 % IV SOLN
250.0000 mL | INTRAVENOUS | Status: DC | PRN
  Administered 2018-10-11: 10:00:00 via INTRAVENOUS

## 2018-10-04 MED ORDER — INSULIN ASPART 100 UNIT/ML ~~LOC~~ SOLN: 0.0000 [IU] | Freq: Three times a day (TID) | SUBCUTANEOUS | Status: DC

## 2018-10-04 MED ORDER — ONDANSETRON HCL 4 MG PO TABS
4.0000 mg | ORAL_TABLET | Freq: Four times a day (QID) | ORAL | Status: DC | PRN
  Filled 2018-10-04: qty 1

## 2018-10-04 MED ORDER — AMLODIPINE BESYLATE 10 MG PO TABS
10.0000 mg | ORAL_TABLET | Freq: Every day | ORAL | Status: DC
  Administered 2018-10-04 – 2018-10-11 (×7): 10 mg via ORAL
  Filled 2018-10-04 (×8): qty 1

## 2018-10-04 MED ORDER — RENA-VITE PO TABS
1.0000 | ORAL_TABLET | Freq: Every day | ORAL | Status: DC
  Administered 2018-10-04 – 2018-10-11 (×8): 1 via ORAL
  Filled 2018-10-04 (×8): qty 1

## 2018-10-04 MED ORDER — LORATADINE 10 MG PO TABS
10.0000 mg | ORAL_TABLET | Freq: Every day | ORAL | Status: DC
  Administered 2018-10-04 – 2018-10-11 (×7): 10 mg via ORAL
  Filled 2018-10-04 (×8): qty 1

## 2018-10-04 MED ORDER — HEPARIN SODIUM (PORCINE) 5000 UNIT/ML IJ SOLN
5000.0000 [IU] | Freq: Three times a day (TID) | INTRAMUSCULAR | Status: DC
  Administered 2018-10-04 – 2018-10-11 (×15): 5000 [IU] via SUBCUTANEOUS
  Filled 2018-10-04 (×13): qty 1

## 2018-10-04 MED ORDER — MORPHINE SULFATE (PF) 4 MG/ML IV SOLN
4.0000 mg | Freq: Once | INTRAVENOUS | Status: AC
  Administered 2018-10-04: 4 mg via INTRAMUSCULAR

## 2018-10-04 MED ORDER — IOHEXOL 350 MG/ML SOLN
75.0000 mL | Freq: Once | INTRAVENOUS | Status: AC | PRN
  Administered 2018-10-04: 75 mL via INTRAVENOUS

## 2018-10-04 MED ORDER — ASPIRIN 81 MG PO CHEW
81.0000 mg | CHEWABLE_TABLET | Freq: Every day | ORAL | Status: DC
  Administered 2018-10-04 – 2018-10-11 (×9): 81 mg via ORAL
  Filled 2018-10-04 (×9): qty 1

## 2018-10-04 MED ORDER — PANCRELIPASE (LIP-PROT-AMYL) 12000-38000 UNITS PO CPEP
24000.0000 [IU] | ORAL_CAPSULE | Freq: Every day | ORAL | Status: DC
  Administered 2018-10-05 – 2018-10-11 (×7): 24000 [IU] via ORAL
  Filled 2018-10-04 (×8): qty 2

## 2018-10-04 MED ORDER — ONDANSETRON HCL 4 MG/2ML IJ SOLN: 4.0000 mg | Freq: Four times a day (QID) | INTRAMUSCULAR | Status: DC | PRN

## 2018-10-04 MED ORDER — VITAMIN B-12 1000 MCG PO TABS
1000.0000 ug | ORAL_TABLET | Freq: Every day | ORAL | Status: DC
  Administered 2018-10-04 – 2018-10-11 (×8): 1000 ug via ORAL
  Filled 2018-10-04 (×8): qty 1

## 2018-10-04 MED ORDER — CHOLECALCIFEROL 10 MCG (400 UNIT) PO TABS
400.0000 [IU] | ORAL_TABLET | Freq: Every day | ORAL | Status: DC
  Administered 2018-10-04 – 2018-10-11 (×7): 400 [IU] via ORAL
  Filled 2018-10-04 (×8): qty 1

## 2018-10-04 MED ORDER — SEVELAMER CARBONATE 800 MG PO TABS
800.0000 mg | ORAL_TABLET | Freq: Two times a day (BID) | ORAL | Status: DC
  Administered 2018-10-07 – 2018-10-08 (×3): 800 mg via ORAL
  Filled 2018-10-04 (×4): qty 1

## 2018-10-04 MED ORDER — ATORVASTATIN CALCIUM 20 MG PO TABS
20.0000 mg | ORAL_TABLET | Freq: Every evening | ORAL | Status: DC
  Administered 2018-10-04 – 2018-10-11 (×8): 20 mg via ORAL
  Filled 2018-10-04 (×8): qty 1

## 2018-10-04 MED ORDER — ACETAMINOPHEN 650 MG RE SUPP: 650.0000 mg | Freq: Four times a day (QID) | RECTAL | Status: DC | PRN

## 2018-10-04 MED ORDER — FERROUS SULFATE 325 (65 FE) MG PO TABS
325.0000 mg | ORAL_TABLET | Freq: Every day | ORAL | Status: DC
  Administered 2018-10-05 – 2018-10-10 (×4): 325 mg via ORAL
  Filled 2018-10-04 (×5): qty 1

## 2018-10-04 MED ORDER — INSULIN ASPART 100 UNIT/ML ~~LOC~~ SOLN
0.0000 [IU] | Freq: Three times a day (TID) | SUBCUTANEOUS | Status: DC
  Administered 2018-10-05 – 2018-10-06 (×3): 2 [IU] via SUBCUTANEOUS
  Filled 2018-10-04 (×3): qty 1

## 2018-10-04 MED ORDER — NITROGLYCERIN 0.4 MG SL SUBL: 0.4000 mg | SUBLINGUAL_TABLET | SUBLINGUAL | Status: DC | PRN

## 2018-10-04 MED ORDER — SODIUM CHLORIDE 0.9% FLUSH
3.0000 mL | INTRAVENOUS | Status: DC | PRN
  Administered 2018-10-07: 3 mL via INTRAVENOUS
  Filled 2018-10-04: qty 3

## 2018-10-04 MED ORDER — ACETAMINOPHEN 325 MG PO TABS
650.0000 mg | ORAL_TABLET | Freq: Four times a day (QID) | ORAL | Status: DC | PRN
  Administered 2018-10-04 – 2018-10-08 (×2): 650 mg via ORAL
  Filled 2018-10-04 (×2): qty 2

## 2018-10-04 MED ORDER — ASPIRIN EC 81 MG PO TBEC: 81.0000 mg | DELAYED_RELEASE_TABLET | Freq: Every day | ORAL | Status: DC

## 2018-10-04 MED ORDER — ALBUTEROL SULFATE (2.5 MG/3ML) 0.083% IN NEBU: 2.5000 mg | INHALATION_SOLUTION | RESPIRATORY_TRACT | Status: DC | PRN

## 2018-10-04 MED ORDER — MORPHINE SULFATE (PF) 4 MG/ML IV SOLN
4.0000 mg | Freq: Once | INTRAVENOUS | Status: DC
  Filled 2018-10-04: qty 1

## 2018-10-04 MED ORDER — PROMETHAZINE HCL 25 MG PO TABS
12.5000 mg | ORAL_TABLET | Freq: Three times a day (TID) | ORAL | Status: DC | PRN
  Administered 2018-10-04 – 2018-10-05 (×2): 12.5 mg via ORAL
  Filled 2018-10-04 (×4): qty 1

## 2018-10-04 NOTE — ED Notes (Signed)
ED TO INPATIENT HANDOFF REPORT  Name/Age/Gender [name] 58 y.o. female  Code Status Code Status History    Date Active Date Inactive Code Status Order ID Comments User Context   06/18/2018 0449 06/20/2018 1642 Full Code [id number]  Arta Silence, MD ED   02/12/2018 1312 02/16/2018 0050 Full Code [id number]  Epifanio Lesches, MD ED   01/17/2018 1848 01/21/2018 1341 Full Code [id number]  Vaughan Basta, MD Inpatient   09/30/2017 1804 10/20/2017 2238 Full Code [id number]  Vaughan Basta, MD Inpatient   09/03/2017 2243 09/05/2017 1901 Full Code [id number]  Lance Coon, MD Inpatient   05/04/2017 1833 05/05/2017 2330 Full Code [id number]  Henreitta Leber, MD Inpatient   03/23/2017 1551 03/25/2017 1739 Full Code [id number]  Gladstone Lighter, MD Inpatient   04/21/2016 1559 04/21/2016 1717 Full Code [id number]  Nicholes Mango, MD Inpatient   04/08/2016 1415 04/11/2016 1947 Full Code [id number]  Theodoro Grist, MD Inpatient   03/15/2016 1322 03/20/2016 0019 Full Code [id number]  Bettey Costa, MD Inpatient   02/16/2016 1409 02/19/2016 2238 Full Code [id number]  Bettey Costa, MD ED   12/17/2015 1139 12/21/2015 2209 Full Code [id number]  Dustin Flock, MD ED   11/12/2015 1602 12/01/2015 1943 Full Code [id number]  Bettey Costa, MD Inpatient   10/07/2015 0937 10/12/2015 1959 Full Code [id number]  Fritzi Mandes, MD ED   10/03/2015 0355 10/05/2015 2230 Full Code [id number]  Lance Coon, MD Inpatient   08/31/2015 1600 09/03/2015 2209 Full Code [id number]  Demetrios Loll, MD ED   07/26/2015 1528 07/26/2015 2136 Full Code [id number]  Dionisio David, MD Inpatient   07/22/2015 1736 07/26/2015 1528 Full Code [id number]  Vaughan Basta, MD Inpatient   06/05/2015 2039 06/07/2015 2003 Full Code [id number]  Bettey Costa, MD Inpatient   05/21/2015 2142 05/24/2015 1626 Full Code [id number]  Henreitta Leber, MD Inpatient   04/24/2015 0855 04/26/2015 2211 Full Code [id number]  Demetrios Loll, MD Inpatient   04/01/2015 1558 04/05/2015 2007 Full Code  [id number]  Epifanio Lesches, MD ED   03/06/2015 0805 03/09/2015 2111 Full Code [id number]  Juluis Mire, MD Inpatient   02/19/2015 1614 02/24/2015 1741 Full Code [id number]  Aldean Jewett, MD Inpatient      Home/SNF/Other Home  Chief Complaint unresponsive  Level of Care/Admitting Diagnosis ED Disposition    ED Disposition Condition [place]: [place] [100120]  Level of Care: Telemetry [5]  Diagnosis: Syncope [092330]  Admitting Physician: Dustin Flock [[id number]]  Attending Physician: Dustin Flock [[id number]]  PT Class (Do Not Modify): Observation [104]  PT Acc Code (Do Not Modify): Observation [10022]       Medical History Past Medical History:  Diagnosis Date  . Anemia   . Anginal pain ([place])   . Anxiety   . Arthritis   . Asthma   . Broken wrist   . Bronchitis   . chronic diastolic CHF 03/16/6388  . COPD (chronic obstructive pulmonary disease) ([place])   . Coronary artery disease    a. cath 2013: stenting to RCA (report not available); b. cath 2014: LM nl, pLAD 40%, mLAD nl, ost LCx 40%, mid LCx nl, pRCA 30% @ site of prior stent, mRCA 50%  . Depression   . Diabetes mellitus without complication ([place])   . Diabetic neuropathy ([place])   . dialysis 2006  . Diverticulosis   . Dizziness   . Dyspnea   . Elevated lipids   . Environmental and seasonal allergies   . ESRD (  end stage renal disease) on dialysis ([place])    M-W-F  . GERD (gastroesophageal reflux disease)   . Headache   . History of hiatal hernia   . HOH (hard of hearing)   . Hx of pancreatitis 2015  . Hypertension   . Lower extremity edema   . Mitral regurgitation    a. echo 10/2013: EF 62%, noWMA, mildly dilated LA, mild to mod MR/TR, GR1DD  . Myocardial infarction ([place])   . Orthopnea   . Pneumonia   . Renal cancer ([place])   . Renal insufficiency    Pt is on dialysis on M,W + F.  . Wheezing     Allergies Allergies  Allergen Reactions  . Ace  Inhibitors Swelling and Anaphylaxis  . Ativan [Lorazepam] Other (See Comments)    Reaction:Hallucinations and headaches  . Compazine [Prochlorperazine Edisylate] Anaphylaxis, Nausea And Vomiting and Other (See Comments)    Other reaction(s): dystonia from this vs. Reglan, 23 Jul - patient relates that she takes promethazine frequently with no problems  . Sumatriptan Succinate Other (See Comments)    Other reaction(s): delirium and hallucinations per [facility] records  . Dilaudid [Hydromorphone Hcl] Other (See Comments)    Delirium   . Ondansetron Other (See Comments)    hallucinations    . Zofran [Ondansetron Hcl] Other (See Comments)    Reaction:  hallucinations   . Codeine Nausea And Vomiting  . Gabapentin Other (See Comments)    Reaction:  Unknown    . [place] Nausea And Vomiting  . Losartan Nausea Only  . Oxycodone Anxiety  . Prochlorperazine Other (See Comments)    Reaction:  Unknown . Patient does not remember reaction but she does have vertigo and anxiety along with n and v at times. Could be used to treat any of these   . Reglan [Metoclopramide] Other (See Comments)    Per patient her Dr. Evelina Bucy her off it   . Scopolamine Other (See Comments)    Dizziness, also has vertigo already  . Tape Rash  . Tapentadol Rash    IV Location/Drains/Wounds Patient Lines/Drains/Airways Status   Active Line/Drains/Airways    Name:   Placement date:   Placement time:   Site:   Days:   Peripheral IV 10/04/18 Right;Anterior Forearm   10/04/18    1241    Forearm   less than 1   Fistula / Graft Left Upper arm Arteriovenous vein graft   -    -    Upper arm      Fistula / Graft Right Upper arm Arteriovenous fistula   -    -    Upper arm             Labs/Imaging Results for orders placed or performed during the hospital encounter of 10/04/18 (from the past 48 hour(s))  Comprehensive metabolic panel     Status: Abnormal   Collection Time: 10/04/18 11:40 AM  Result Value Ref Range   Sodium  135 135 - 145 mmol/L   Potassium 4.6 3.5 - 5.1 mmol/L   Chloride 98 98 - 111 mmol/L   CO2 23 22 - 32 mmol/L   Glucose, Bld 159 (H) 70 - 99 mg/dL   BUN 38 (H) 6 - 20 mg/dL   Creatinine, Ser 9.04 (H) 0.44 - 1.00 mg/dL   Calcium 8.1 (L) 8.9 - 10.3 mg/dL   Total Protein 7.5 6.5 - 8.1 g/dL   Albumin 3.6 3.5 - 5.0 g/dL   AST 25 15 -  41 U/L   ALT 15 0 - 44 U/L   Alkaline Phosphatase 112 38 - 126 U/L   Total Bilirubin 0.9 0.3 - 1.2 mg/dL   GFR calc non Af Amer 4 (L) >60 mL/min   GFR calc Af Amer 5 (L) >60 mL/min   Anion gap 14 5 - 15    Comment: Performed at [facility], [address] [place], [place] [address]  Ethanol     Status: None   Collection Time: 10/04/18 11:40 AM  Result Value Ref Range   Alcohol, Ethyl (B) <10 <10 mg/dL    Comment: (NOTE) Lowest detectable limit for serum alcohol is 10 mg/dL. For medical purposes only. Performed at [facility], [address] [place], [place] [address]   Acetaminophen level     Status: Abnormal   Collection Time: 10/04/18 11:40 AM  Result Value Ref Range   Acetaminophen (Tylenol), Serum <10 (L) 10 - 30 ug/mL    Comment: (NOTE) Therapeutic concentrations vary significantly. A range of 10-30 ug/mL  may be an effective concentration for many patients. However, some  are best treated at concentrations outside of this range. Acetaminophen concentrations >150 ug/mL at 4 hours after ingestion  and >50 ug/mL at 12 hours after ingestion are often associated with  toxic reactions. Performed at [facility], [address] [place], [place] [address]   Lipase, blood     Status: Abnormal   Collection Time: 10/04/18 11:40 AM  Result Value Ref Range   Lipase 83 (H) 11 - 51 U/L    Comment: Performed at [facility], [address] [place], [place] [address]  Troponin I - Once     Status: None   Collection Time: 10/04/18 11:40 AM  Result Value Ref Range   Troponin I <0.03 <0.03 ng/mL    Comment:  Performed at [facility], [address] [place], [place] [address]  Lactic acid, plasma     Status: None   Collection Time: 10/04/18 11:40 AM  Result Value Ref Range   Lactic Acid, Venous 0.8 0.5 - 1.9 mmol/L    Comment: Performed at [facility], [address] [place], [place] [address]  Salicylate level     Status: None   Collection Time: 10/04/18 11:40 AM  Result Value Ref Range   Salicylate Lvl <1.2 2.8 - 30.0 mg/dL    Comment: Performed at [facility], [address] [place], [place] [address]  CBC with Differential     Status: Abnormal   Collection Time: 10/04/18 11:40 AM  Result Value Ref Range   WBC 4.8 4.0 - 10.5 K/uL   RBC 3.28 (L) 3.87 - 5.11 MIL/uL   Hemoglobin 9.7 (L) 12.0 - 15.0 g/dL   HCT 30.7 (L) 36.0 - 46.0 %   MCV 93.6 80.0 - 100.0 fL   MCH 29.6 26.0 - 34.0 pg   MCHC 31.6 30.0 - 36.0 g/dL   RDW 15.8 (H) 11.5 - 15.5 %   Platelets 209 150 - 400 K/uL   nRBC 0.0 0.0 - 0.2 %   Neutrophils Relative % 53 %   Neutro Abs 2.6 1.7 - 7.7 K/uL   Lymphocytes Relative 34 %   Lymphs Abs 1.6 0.7 - 4.0 K/uL   Monocytes Relative 8 %   Monocytes Absolute 0.4 0.1 - 1.0 K/uL   Eosinophils Relative 4 %   Eosinophils Absolute 0.2 0.0 - 0.5 K/uL   Basophils Relative 0 %   Basophils  Absolute 0.0 0.0 - 0.1 K/uL   WBC Morphology MORPHOLOGY UNREMARKABLE    RBC Morphology MORPHOLOGY UNREMARKABLE    Smear Review Normal platelet morphology    Immature Granulocytes 1 %   Abs Immature Granulocytes 0.03 0.00 - 0.07 K/uL    Comment: Performed at [facility], [address]  [place], [place] [address]  Ammonia     Status: None   Collection Time: 10/04/18 11:40 AM  Result Value Ref Range   Ammonia 17 9 - 35 umol/L    Comment: Performed at [facility], [address] [place], [place] [address]  Brain natriuretic peptide     Status: Abnormal   Collection Time: 10/04/18 11:40 AM  Result Value Ref Range   B Natriuretic Peptide 348.0  (H) 0.0 - 100.0 pg/mL    Comment: Performed at [facility], [address] [place], [place] [address]  Glucose, capillary     Status: Abnormal   Collection Time: 10/04/18  2:55 PM  Result Value Ref Range   Glucose-Capillary 142 (H) 70 - 99 mg/dL  Sedimentation rate     Status: Abnormal   Collection Time: 10/04/18  4:48 PM  Result Value Ref Range   Sed Rate 83 (H) 0 - 30 mm/hr    Comment: Performed at [facility], [address] [place], [place] [address]  Glucose, capillary     Status: Abnormal   Collection Time: 10/04/18  5:31 PM  Result Value Ref Range   Glucose-Capillary 116 (H) 70 - 99 mg/dL   Ct Angio Head W Or Wo Contrast  Result Date: 10/04/2018 CLINICAL DATA:  Sudden onset of unresponsiveness beginning this morning. Negative head CT. EXAM: CT ANGIOGRAPHY HEAD AND NECK TECHNIQUE: Multidetector CT imaging of the head and neck was performed using the standard protocol during bolus administration of intravenous contrast. Multiplanar CT image reconstructions and MIPs were obtained to evaluate the vascular anatomy. Carotid stenosis measurements (when applicable) are obtained utilizing NASCET criteria, using the distal internal carotid diameter as the denominator. CONTRAST:  84mL OMNIPAQUE IOHEXOL 350 MG/ML SOLN COMPARISON:  Head CT earlier same day. MRI 04/22/2016. FINDINGS: CTA NECK FINDINGS Aortic arch: Mild aortic atherosclerosis. No aneurysm or dissection. Branching pattern of the brachiocephalic vessels is normal. 20% stenosis of the left subclavian artery origin. Right carotid system: Common carotid artery widely patent to the bifurcation. Soft and calcified plaque at the bifurcation and ICA bulb. Minimal diameter in the ICA bulb is 1.8 mm. Compared to a more distal cervical ICA diameter of 4.6 mm, this indicates a 60% stenosis. Left carotid system: Common carotid artery widely patent to the bifurcation. Soft and calcified plaque at the carotid bifurcation and ICA  bulb. Minimal diameter of the ICA bulb is 2 mm. Compared to a more distal cervical ICA diameter of 4 mm, this indicates a 50% stenosis. Vertebral arteries: Both vertebral artery origins are widely patent. Both vertebral arteries are widely patent through the cervical region to the foramen magnum. Skeleton: Normal Other neck: Normal Upper chest: Normal Review of the MIP images confirms the above findings CTA HEAD FINDINGS Anterior circulation: Both internal carotid arteries are patent through the skull base and siphon regions. There is atherosclerotic calcification throughout the carotid siphons but without stenosis greater than 50% suspected. Supraclinoid ICA stenosis likely approaches 70%. The anterior and middle cerebral vessels show flow. No large or medium vessel occlusion identified. Atherosclerotic narrowing of the left A1 segment. Posterior circulation: Both vertebral arteries are patent to the basilar.  No basilar stenosis. Posterior circulation branch vessels are patent. Venous sinuses: Patent and normal. Anatomic variants: None Delayed phase: No abnormal enhancement. Review of the MIP images confirms the above findings IMPRESSION: 1. Atherosclerotic disease at both carotid bifurcations. 60% stenosis of the proximal ICA on the right. 50% stenosis of the proximal ICA on the left. 2. Atherosclerotic disease throughout the carotid siphons but without stenosis greater than 50%. More severe stenosis of the supraclinoid internal carotid arteries on each side, approximately 70%. 3. No intracranial large or medium vessel occlusion or correctable proximal stenosis. No significant posterior circulation pathology. Electronically Signed   By: Nelson Chimes M.D.   On: 10/04/2018 13:54   Ct Head Wo Contrast  Result Date: 10/04/2018 CLINICAL DATA:  Headache and altered level of consciousness. EXAM: CT HEAD WITHOUT CONTRAST TECHNIQUE: Contiguous axial images were obtained from the base of the skull through the vertex  without intravenous contrast. COMPARISON:  08/14/2017 FINDINGS: Brain: The brain shows a normal appearance without evidence of malformation, atrophy, old or acute small or large vessel infarction, mass lesion, hemorrhage, hydrocephalus or extra-axial collection. Vascular: No hyperdense vessel. No evidence of atherosclerotic calcification. Skull: Normal. No traumatic finding. No focal bone lesion. Sinuses/Orbits: Sinuses are clear. Orbits appear normal. Mastoids are clear. Other: None significant IMPRESSION: Normal head CT. Electronically Signed   By: Nelson Chimes M.D.   On: 10/04/2018 11:27   Ct Angio Neck W And/or Wo Contrast  Result Date: 10/04/2018 CLINICAL DATA:  Sudden onset of unresponsiveness beginning this morning. Negative head CT. EXAM: CT ANGIOGRAPHY HEAD AND NECK TECHNIQUE: Multidetector CT imaging of the head and neck was performed using the standard protocol during bolus administration of intravenous contrast. Multiplanar CT image reconstructions and MIPs were obtained to evaluate the vascular anatomy. Carotid stenosis measurements (when applicable) are obtained utilizing NASCET criteria, using the distal internal carotid diameter as the denominator. CONTRAST:  12mL OMNIPAQUE IOHEXOL 350 MG/ML SOLN COMPARISON:  Head CT earlier same day. MRI 04/22/2016. FINDINGS: CTA NECK FINDINGS Aortic arch: Mild aortic atherosclerosis. No aneurysm or dissection. Branching pattern of the brachiocephalic vessels is normal. 20% stenosis of the left subclavian artery origin. Right carotid system: Common carotid artery widely patent to the bifurcation. Soft and calcified plaque at the bifurcation and ICA bulb. Minimal diameter in the ICA bulb is 1.8 mm. Compared to a more distal cervical ICA diameter of 4.6 mm, this indicates a 60% stenosis. Left carotid system: Common carotid artery widely patent to the bifurcation. Soft and calcified plaque at the carotid bifurcation and ICA bulb. Minimal diameter of the ICA bulb is  2 mm. Compared to a more distal cervical ICA diameter of 4 mm, this indicates a 50% stenosis. Vertebral arteries: Both vertebral artery origins are widely patent. Both vertebral arteries are widely patent through the cervical region to the foramen magnum. Skeleton: Normal Other neck: Normal Upper chest: Normal Review of the MIP images confirms the above findings CTA HEAD FINDINGS Anterior circulation: Both internal carotid arteries are patent through the skull base and siphon regions. There is atherosclerotic calcification throughout the carotid siphons but without stenosis greater than 50% suspected. Supraclinoid ICA stenosis likely approaches 70%. The anterior and middle cerebral vessels show flow. No large or medium vessel occlusion identified. Atherosclerotic narrowing of the left A1 segment. Posterior circulation: Both vertebral arteries are patent to the basilar. No basilar stenosis. Posterior circulation branch vessels are patent. Venous sinuses: Patent and normal. Anatomic variants: None Delayed phase: No abnormal enhancement. Review of the MIP  images confirms the above findings IMPRESSION: 1. Atherosclerotic disease at both carotid bifurcations. 60% stenosis of the proximal ICA on the right. 50% stenosis of the proximal ICA on the left. 2. Atherosclerotic disease throughout the carotid siphons but without stenosis greater than 50%. More severe stenosis of the supraclinoid internal carotid arteries on each side, approximately 70%. 3. No intracranial large or medium vessel occlusion or correctable proximal stenosis. No significant posterior circulation pathology. Electronically Signed   By: Nelson Chimes M.D.   On: 10/04/2018 13:54   Dg Chest Portable 1 View  Result Date: 10/04/2018 CLINICAL DATA:  Sudden onset of mental status change. EXAM: PORTABLE CHEST 1 VIEW COMPARISON:  06/17/2018 FINDINGS: Heart size is normal. There is a poor inspiration. There may be venous hypertension but there is no frank  edema, infiltrate, collapse or effusion. Vascular stent present on both sides, presumably venous. IMPRESSION: Poor inspiration. Possible venous hypertension without frank edema. Electronically Signed   By: Nelson Chimes M.D.   On: 10/04/2018 12:09    Pending Labs Unresulted Labs (From admission, onward)    Start     Ordered   10/04/18 1651  Influenza panel by PCR (type A & B)  ONCE - STAT,   STAT     10/04/18 1650   10/04/18 1103  Urinalysis, Complete w Microscopic  ONCE - STAT,   STAT     10/04/18 1106   10/04/18 1103  Blood gas, venous  Once,   STAT     10/04/18 1106   Signed and Held  CBC  (heparin)  Once,   R    Comments:  Baseline for heparin therapy IF NOT ALREADY DRAWN.  Notify MD if PLT < 100 K.    Signed and Held   Signed and Held  Creatinine, serum  (heparin)  Once,   R    Comments:  Baseline for heparin therapy IF NOT ALREADY DRAWN.    Signed and Held   Signed and Held  TSH  Once,   R     Signed and Held   Signed and Held  Troponin I - Now Then Q6H  Now then every 6 hours,   R     Signed and Held   Signed and Held  CBC  Tomorrow morning,   R     Signed and Held   Signed and Held  Basic metabolic panel  Tomorrow morning,   R     Signed and Held          Vitals/Pain Today's Vitals   10/04/18 1700 10/04/18 1730 10/04/18 1800 10/04/18 1814  BP: (!) 164/54 (!) 163/65 (!) 147/48   Pulse: 60 61 60   Resp: 16 14 15    Temp:      TempSrc:      SpO2: 98% 97% 95%   Weight:      Height:      PainSc:    10-Worst pain ever    Isolation Precautions No active isolations  Medications Medications  acetaminophen (TYLENOL) tablet 650 mg (650 mg Oral Given 10/04/18 1728)    Or  acetaminophen (TYLENOL) suppository 650 mg ( Rectal See Alternative 10/04/18 1728)  aspirin chewable tablet 81 mg (81 mg Oral Given 10/04/18 1728)  insulin aspart (novoLOG) injection 0-9 Units (has no administration in time range)  morphine 4 MG/ML injection 4 mg (4 mg Intramuscular Given 10/04/18  1130)  iohexol (OMNIPAQUE) 350 MG/ML injection 75 mL (75 mLs Intravenous Contrast Given 10/04/18 1324)  Mobility Pt able to stand with two staff assist but was not able to take a step forward

## 2018-10-04 NOTE — ED Notes (Signed)
Dr. Cinda Quest confirmed pt would nee dialysis today. Dialysis nurse updated.

## 2018-10-04 NOTE — ED Notes (Signed)
RN and tech at bedside with walker to attempt to ambulate pt. PT able to sit on sit of bed and bear weight on feet but pt has to work very hard to get to a standing position. Pt started to breath heavier and reports feeling dizzy. No change in heart rate noted and no change in oxygenation. Pt unable to take steps forward and started shaking and became tearful reporting she was very weak.

## 2018-10-04 NOTE — ED Notes (Signed)
Attempting IV access twice without success.

## 2018-10-04 NOTE — H&P (Signed)
[facility] [place] NAME: [name]    MR#:  [id number]  DATE OF BIRTH:  02/08/1961  DATE OF ADMISSION:  10/04/2018  PRIMARY CARE PHYSICIAN: Freddy Finner, NP   REQUESTING/REFERRING PHYSICIAN: Nena Polio, MD  CHIEF COMPLAINT:   Chief Complaint  Patient presents with  . Altered Mental Status    HISTORY OF PRESENT ILLNESS: [name]  is a 58 y.o. female with a known history of end-stage renal disease, peripheral vascular disease, COPD, chronic diastolic CHF, coronary artery disease, diabetes type 2, diabetic neuropathy, hyperlipidemia, GERD who is presenting to the hospital with syncopal episodes.  Patient was seen in the emergency room a few days ago for abdominal pain and diarrhea.  At that time CT scan of the abdomen was negative.  Further work-up was negative and she was discharged home.  Patient states that she started having a right-sided headache and then apparently passed out.  Work-up so far in the emergency room has been negative.  She has been having some fevers and has been having some chills as well.  PAST MEDICAL HISTORY:   Past Medical History:  Diagnosis Date  . Anemia   . Anginal pain ([place])   . Anxiety   . Arthritis   . Asthma   . Broken wrist   . Bronchitis   . chronic diastolic CHF 2/67/1245  . COPD (chronic obstructive pulmonary disease) ([place])   . Coronary artery disease    a. cath 2013: stenting to RCA (report not available); b. cath 2014: LM nl, pLAD 40%, mLAD nl, ost LCx 40%, mid LCx nl, pRCA 30% @ site of prior stent, mRCA 50%  . Depression   . Diabetes mellitus without complication ([place])   . Diabetic neuropathy ([place])   . dialysis 2006  . Diverticulosis   . Dizziness   . Dyspnea   . Elevated lipids   . Environmental and seasonal allergies   . ESRD (end stage renal disease) on dialysis ([place])    M-W-F  . GERD (gastroesophageal reflux disease)   . Headache   . History of hiatal hernia   . HOH (hard of  hearing)   . Hx of pancreatitis 2015  . Hypertension   . Lower extremity edema   . Mitral regurgitation    a. echo 10/2013: EF 62%, noWMA, mildly dilated LA, mild to mod MR/TR, GR1DD  . Myocardial infarction ([place])   . Orthopnea   . Pneumonia   . Renal cancer ([place])   . Renal insufficiency    Pt is on dialysis on M,W + F.  . Wheezing     PAST SURGICAL HISTORY:  Past Surgical History:  Procedure Laterality Date  . A/V SHUNTOGRAM Left 01/20/2018   Procedure: A/V SHUNTOGRAM;  Surgeon: Algernon Huxley, MD;  Location: [facility];  Service: Cardiovascular;  Laterality: Left;  . ABDOMINAL HYSTERECTOMY  1992  . AMPUTATION TOE Left 10/02/2017   Procedure: AMPUTATION TOE-LEFT GREAT TOE;  Surgeon: Albertine Patricia, DPM;  Location: [facility];  Service: Podiatry;  Laterality: Left;  . APPENDECTOMY    . CARDIAC CATHETERIZATION Left 07/26/2015   Procedure: Left Heart Cath and Coronary Angiography;  Surgeon: Dionisio David, MD;  Location: [place];  Service: Cardiovascular;  Laterality: Left;  . CATARACT EXTRACTION W/ INTRAOCULAR LENS IMPLANT Right   . CATARACT EXTRACTION W/PHACO Left 03/10/2017   Procedure: CATARACT EXTRACTION PHACO AND INTRAOCULAR LENS PLACEMENT (IOC);  Surgeon: Birder Robson, MD;  Location: [facility];  Service: Ophthalmology;  Laterality: Left;  [place] 00:51.9 AP% 14.2 CDE 7.39 fluid pack lot # [id number]  . CHOLECYSTECTOMY    . COLONOSCOPY WITH PROPOFOL N/A 08/12/2016   Procedure: COLONOSCOPY WITH PROPOFOL;  Surgeon: Lollie Sails, MD;  Location: [facility] ENDOSCOPY;  Service: Endoscopy;  Laterality: N/A;  . DIALYSIS FISTULA CREATION Left    upper arm  . dialysis grafts    . ESOPHAGOGASTRODUODENOSCOPY N/A 03/08/2015   Procedure: ESOPHAGOGASTRODUODENOSCOPY (EGD);  Surgeon: Manya Silvas, MD;  Location: [facility] ENDOSCOPY;  Service: Endoscopy;  Laterality: N/A;  . ESOPHAGOGASTRODUODENOSCOPY (EGD) WITH PROPOFOL N/A 03/18/2016   Procedure: ESOPHAGOGASTRODUODENOSCOPY (EGD)  WITH PROPOFOL;  Surgeon: [name], MD;  Location: [facility] ENDOSCOPY;  Service: Endoscopy;  Laterality: N/A;  . EYE SURGERY Right 2018  . FECAL TRANSPLANT N/A 08/23/2015   Procedure: FECAL TRANSPLANT;  Surgeon: Manya Silvas, MD;  Location: [facility] ENDOSCOPY;  Service: Endoscopy;  Laterality: N/A;  . HAND SURGERY Bilateral   . PERIPHERAL VASCULAR CATHETERIZATION N/A 12/20/2015   Procedure: Thrombectomy of dialysis access versus permcath placement;  Surgeon: Algernon Huxley, MD;  Location: [place] [facility];  Service: Cardiovascular;  Laterality: N/A;  . PERIPHERAL VASCULAR CATHETERIZATION N/A 12/20/2015   Procedure: A/V Shunt Intervention;  Surgeon: Algernon Huxley, MD;  Location: [place] [facility];  Service: Cardiovascular;  Laterality: N/A;  . PERIPHERAL VASCULAR CATHETERIZATION N/A 12/20/2015   Procedure: A/V Shuntogram/Fistulagram;  Surgeon: Algernon Huxley, MD;  Location: [place] [facility];  Service: Cardiovascular;  Laterality: N/A;  . PERIPHERAL VASCULAR CATHETERIZATION N/A 01/02/2016   Procedure: A/V Shuntogram/Fistulagram;  Surgeon: Algernon Huxley, MD;  Location: [place] [facility];  Service: Cardiovascular;  Laterality: N/A;  . PERIPHERAL VASCULAR CATHETERIZATION N/A 01/02/2016   Procedure: A/V Shunt Intervention;  Surgeon: Algernon Huxley, MD;  Location: [place] [facility];  Service: Cardiovascular;  Laterality: N/A;    SOCIAL HISTORY:  Social History   Tobacco Use  . Smoking status: Former Smoker    Packs/day: 0.50    Years: 40.00    Pack years: 20.00    Types: Cigarettes    Last attempt to quit: 02/13/2015    Years since quitting: 3.6  . Smokeless tobacco: Never Used  Substance Use Topics  . Alcohol use: Not Currently    Comment: glass wine week per pt    FAMILY HISTORY:  Family History  Problem Relation Age of Onset  . Kidney disease Mother   . Diabetes Mother   . Cancer Father   . Kidney disease Sister     DRUG ALLERGIES:  Allergies  Allergen Reactions  . Ace Inhibitors  Swelling and Anaphylaxis  . Ativan [Lorazepam] Other (See Comments)    Reaction:Hallucinations and headaches  . Compazine [Prochlorperazine Edisylate] Anaphylaxis, Nausea And Vomiting and Other (See Comments)    Other reaction(s): dystonia from this vs. Reglan, 23 Jul - patient relates that she takes promethazine frequently with no problems  . Sumatriptan Succinate Other (See Comments)    Other reaction(s): delirium and hallucinations per [facility] records  . Dilaudid [Hydromorphone Hcl] Other (See Comments)    Delirium   . Ondansetron Other (See Comments)    hallucinations    . Zofran [Ondansetron Hcl] Other (See Comments)    Reaction:  hallucinations   . Codeine Nausea And Vomiting  . Gabapentin Other (See Comments)    Reaction:  Unknown    . [place] Nausea And Vomiting  . Losartan Nausea Only  . Oxycodone  Anxiety  . Prochlorperazine Other (See Comments)    Reaction:  Unknown . Patient does not remember reaction but she does have vertigo and anxiety along with n and v at times. Could be used to treat any of these   . Reglan [Metoclopramide] Other (See Comments)    Per patient her Dr. Evelina Bucy her off it   . Scopolamine Other (See Comments)    Dizziness, also has vertigo already  . Tape Rash  . Tapentadol Rash    REVIEW OF SYSTEMS:   CONSTITUTIONAL: No fever, fatigue or weakness.  EYES: No blurred or double vision.  EARS, NOSE, AND THROAT: No tinnitus or ear pain.  RESPIRATORY: No cough, shortness of breath, wheezing or hemoptysis.  CARDIOVASCULAR: No chest pain, orthopnea, edema.  GASTROINTESTINAL: No nausea, vomiting, diarrhea or abdominal pain.  GENITOURINARY: No dysuria, hematuria.  ENDOCRINE: No polyuria, nocturia,  HEMATOLOGY: No anemia, easy bruising or bleeding SKIN: No rash or lesion. MUSCULOSKELETAL: No joint pain or arthritis.   NEUROLOGIC: No tingling, numbness, weakness.  Positive syncope PSYCHIATRY: No anxiety or depression.   MEDICATIONS AT HOME:  Prior to  Admission medications   Medication Sig Start Date End Date Taking? Authorizing Provider  albuterol (PROVENTIL HFA;VENTOLIN HFA) 108 (90 Base) MCG/ACT inhaler Inhale 2 puffs into the lungs every 6 (six) hours as needed for wheezing or shortness of breath. 02/17/18  Yes Merlyn Lot, MD  albuterol (PROVENTIL) (2.5 MG/3ML) 0.083% nebulizer solution Take 2.5 mg by nebulization every 4 (four) hours as needed for wheezing or shortness of breath.   Yes [provider]  amLODipine (NORVASC) 10 MG tablet Take 10 mg by mouth daily.   Yes [provider]  aspirin EC 81 MG tablet Take 81 mg by mouth daily. 09/08/16  Yes [provider]  atorvastatin (LIPITOR) 20 MG tablet Take 20 mg by mouth every evening.  12/02/16  Yes [provider]  Biotin 1 MG CAPS Take 1 mg by mouth daily.   Yes [provider]  cetirizine (ZYRTEC) 10 MG tablet Take 10 mg by mouth daily.    Yes [provider]  cholecalciferol (VITAMIN D) 400 units TABS tablet Take 400 Units by mouth daily.   Yes [provider]  cyanocobalamin 1000 MCG tablet Take 1,000 mcg by mouth daily.    Yes [provider]  diclofenac sodium (VOLTAREN) 1 % GEL Apply 1 application topically daily as needed (for rash).   Yes [provider]  ferrous sulfate ([employer] IRON) 325 (65 FE) MG tablet Take 325 mg by mouth daily with breakfast.   Yes [provider]  FLUoxetine (PROZAC) 20 MG capsule Take 1 capsule (20 mg total) by mouth daily. Patient taking differently: Take 20 mg by mouth at bedtime.  04/10/16  Yes Hower, Aaron Mose, MD  hydrocortisone cream 0.5 % Apply topically 3 (three) times daily. 01/21/18  Yes Wieting, Richard, MD  insulin aspart (NOVOLOG FLEXPEN) 100 UNIT/ML FlexPen Inject 4-15 Units into the skin 4 (four) times daily as needed for high blood sugar (> 100, per sliding scale).   Yes [provider]  lidocaine (LIDODERM) 5 % Place 1 patch onto the skin every  12 (twelve) hours. Remove & Discard patch within 12 hours or as directed by MD 02/20/18 02/20/19 Yes Nance Pear, MD  lipase/protease/amylase (CREON) 12000 UNITS CPEP capsule Take 36000 mg in the morning, 24000 midday & 36000 in the evening   Yes [provider]  LORazepam (ATIVAN PO) Take by mouth.  Yes [provider]  multivitamin (RENA-VIT) TABS tablet Take 1 tablet by mouth daily.    Yes [provider]  nitroGLYCERIN (NITROSTAT) 0.4 MG SL tablet Place 0.4 mg under the tongue every 5 (five) minutes as needed for chest pain.    Yes [provider]  ranitidine (ZANTAC) 300 MG capsule Take 1 capsule by mouth every evening. 01/19/18  Yes [provider]  sevelamer carbonate (RENVELA) 800 MG tablet Take 800-2,400 mg by mouth See admin instructions. Take 2400 mg by mouth 3 times daily with meals and take 800 mg by mouth with snacks   Yes [provider]      PHYSICAL EXAMINATION:   VITAL SIGNS: Blood pressure (!) 171/63, pulse 70, temperature 98.5 F (36.9 C), temperature source Oral, resp. rate 15, height 5\' 5"  (1.651 m), weight 85 kg, SpO2 97 %.  GENERAL:  58 y.o.-year-old patient lying in the bed with no acute distress.  EYES: Pupils equal, round, reactive to light and accommodation. No scleral icterus. Extraocular muscles intact.  HEENT: Head atraumatic, normocephalic. Oropharynx and nasopharynx clear.  NECK:  Supple, no jugular venous distention. No thyroid enlargement, no tenderness.  LUNGS: Normal breath sounds bilaterally, no wheezing, rales,rhonchi or crepitation. No use of accessory muscles of respiration.  CARDIOVASCULAR: S1, S2 normal. No murmurs, rubs, or gallops.  ABDOMEN: Soft, nontender, nondistended. Bowel sounds present. No organomegaly or mass.  EXTREMITIES: No pedal edema, cyanosis, or clubbing.  NEUROLOGIC: Cranial nerves II through XII are intact. Muscle strength 5/5 in all extremities. Sensation intact. Gait not  checked.  PSYCHIATRIC: The patient is alert and oriented x 3.  SKIN: No obvious rash, lesion, or ulcer.   LABORATORY PANEL:   CBC Recent Labs  Lab 09/28/18 0824 10/02/18 1151 10/04/18 1140  WBC 5.6 5.3 4.8  HGB 10.3* 10.1* 9.7*  HCT 33.0* 32.9* 30.7*  PLT 269 189 209  MCV 92.4 94.5 93.6  MCH 28.9 29.0 29.6  MCHC 31.2 30.7 31.6  RDW 14.4 15.3 15.8*  LYMPHSABS 1.3 0.9 1.6  MONOABS 0.7 0.5 0.4  EOSABS 0.5 0.3 0.2  BASOSABS 0.0 0.0 0.0   ------------------------------------------------------------------------------------------------------------------  Chemistries  Recent Labs  Lab 10/02/18 1151 10/04/18 1140  NA 134* 135  K 4.3 4.6  CL 97* 98  CO2 24 23  GLUCOSE 221* 159*  BUN 21* 38*  CREATININE 5.73* 9.04*  CALCIUM 8.4* 8.1*  AST 24 25  ALT 15 15  ALKPHOS 136* 112  BILITOT 0.8 0.9   ------------------------------------------------------------------------------------------------------------------ estimated creatinine clearance is 7.2 mL/min (A) (by C-G formula based on SCr of 9.04 mg/dL (H)). ------------------------------------------------------------------------------------------------------------------ No results for input(s): TSH, T4TOTAL, T3FREE, THYROIDAB in the last 72 hours.  Invalid input(s): FREET3   Coagulation profile No results for input(s): INR, PROTIME in the last 168 hours. ------------------------------------------------------------------------------------------------------------------- No results for input(s): DDIMER in the last 72 hours. -------------------------------------------------------------------------------------------------------------------  Cardiac Enzymes Recent Labs  Lab 10/04/18 1140  TROPONINI <0.03   ------------------------------------------------------------------------------------------------------------------ Invalid input(s):  POCBNP  ---------------------------------------------------------------------------------------------------------------  Urinalysis    Component Value Date/Time   COLORURINE YELLOW (A) 06/09/2017 1039   APPEARANCEUR CLEAR (A) 06/09/2017 1039   APPEARANCEUR Clear 09/15/2014 0947   LABSPEC 1.014 06/09/2017 1039   LABSPEC 1.008 09/15/2014 0947   PHURINE 8.0 06/09/2017 1039   GLUCOSEU 150 (A) 06/09/2017 1039   GLUCOSEU 150 mg/dL 09/15/2014 0947   HGBUR SMALL (A) 06/09/2017 1039   BILIRUBINUR NEGATIVE 06/09/2017 1039   BILIRUBINUR Negative 09/15/2014 [place] 06/09/2017 1039   PROTEINUR >=  300 (A) 06/09/2017 1039   UROBILINOGEN 0.2 06/30/2010 2130   NITRITE NEGATIVE 06/09/2017 1039   LEUKOCYTESUR NEGATIVE 06/09/2017 1039   LEUKOCYTESUR Negative 09/15/2014 0947     RADIOLOGY: Ct Angio Head W Or Wo Contrast  Result Date: 10/04/2018 CLINICAL DATA:  Sudden onset of unresponsiveness beginning this morning. Negative head CT. EXAM: CT ANGIOGRAPHY HEAD AND NECK TECHNIQUE: Multidetector CT imaging of the head and neck was performed using the standard protocol during bolus administration of intravenous contrast. Multiplanar CT image reconstructions and MIPs were obtained to evaluate the vascular anatomy. Carotid stenosis measurements (when applicable) are obtained utilizing NASCET criteria, using the distal internal carotid diameter as the denominator. CONTRAST:  64mL OMNIPAQUE IOHEXOL 350 MG/ML SOLN COMPARISON:  Head CT earlier same day. MRI 04/22/2016. FINDINGS: CTA NECK FINDINGS Aortic arch: Mild aortic atherosclerosis. No aneurysm or dissection. Branching pattern of the brachiocephalic vessels is normal. 20% stenosis of the left subclavian artery origin. Right carotid system: Common carotid artery widely patent to the bifurcation. Soft and calcified plaque at the bifurcation and ICA bulb. Minimal diameter in the ICA bulb is 1.8 mm. Compared to a more distal cervical ICA diameter of  4.6 mm, this indicates a 60% stenosis. Left carotid system: Common carotid artery widely patent to the bifurcation. Soft and calcified plaque at the carotid bifurcation and ICA bulb. Minimal diameter of the ICA bulb is 2 mm. Compared to a more distal cervical ICA diameter of 4 mm, this indicates a 50% stenosis. Vertebral arteries: Both vertebral artery origins are widely patent. Both vertebral arteries are widely patent through the cervical region to the foramen magnum. Skeleton: Normal Other neck: Normal Upper chest: Normal Review of the MIP images confirms the above findings CTA HEAD FINDINGS Anterior circulation: Both internal carotid arteries are patent through the skull base and siphon regions. There is atherosclerotic calcification throughout the carotid siphons but without stenosis greater than 50% suspected. Supraclinoid ICA stenosis likely approaches 70%. The anterior and middle cerebral vessels show flow. No large or medium vessel occlusion identified. Atherosclerotic narrowing of the left A1 segment. Posterior circulation: Both vertebral arteries are patent to the basilar. No basilar stenosis. Posterior circulation branch vessels are patent. Venous sinuses: Patent and normal. Anatomic variants: None Delayed phase: No abnormal enhancement. Review of the MIP images confirms the above findings IMPRESSION: 1. Atherosclerotic disease at both carotid bifurcations. 60% stenosis of the proximal ICA on the right. 50% stenosis of the proximal ICA on the left. 2. Atherosclerotic disease throughout the carotid siphons but without stenosis greater than 50%. More severe stenosis of the supraclinoid internal carotid arteries on each side, approximately 70%. 3. No intracranial large or medium vessel occlusion or correctable proximal stenosis. No significant posterior circulation pathology. Electronically Signed   By: Nelson Chimes M.D.   On: 10/04/2018 13:54   Ct Head Wo Contrast  Result Date: 10/04/2018 CLINICAL  DATA:  Headache and altered level of consciousness. EXAM: CT HEAD WITHOUT CONTRAST TECHNIQUE: Contiguous axial images were obtained from the base of the skull through the vertex without intravenous contrast. COMPARISON:  08/14/2017 FINDINGS: Brain: The brain shows a normal appearance without evidence of malformation, atrophy, old or acute small or large vessel infarction, mass lesion, hemorrhage, hydrocephalus or extra-axial collection. Vascular: No hyperdense vessel. No evidence of atherosclerotic calcification. Skull: Normal. No traumatic finding. No focal bone lesion. Sinuses/Orbits: Sinuses are clear. Orbits appear normal. Mastoids are clear. Other: None significant IMPRESSION: Normal head CT. Electronically Signed   By: Jan Fireman.D.  On: 10/04/2018 11:27   Ct Angio Neck W And/or Wo Contrast  Result Date: 10/04/2018 CLINICAL DATA:  Sudden onset of unresponsiveness beginning this morning. Negative head CT. EXAM: CT ANGIOGRAPHY HEAD AND NECK TECHNIQUE: Multidetector CT imaging of the head and neck was performed using the standard protocol during bolus administration of intravenous contrast. Multiplanar CT image reconstructions and MIPs were obtained to evaluate the vascular anatomy. Carotid stenosis measurements (when applicable) are obtained utilizing NASCET criteria, using the distal internal carotid diameter as the denominator. CONTRAST:  41mL OMNIPAQUE IOHEXOL 350 MG/ML SOLN COMPARISON:  Head CT earlier same day. MRI 04/22/2016. FINDINGS: CTA NECK FINDINGS Aortic arch: Mild aortic atherosclerosis. No aneurysm or dissection. Branching pattern of the brachiocephalic vessels is normal. 20% stenosis of the left subclavian artery origin. Right carotid system: Common carotid artery widely patent to the bifurcation. Soft and calcified plaque at the bifurcation and ICA bulb. Minimal diameter in the ICA bulb is 1.8 mm. Compared to a more distal cervical ICA diameter of 4.6 mm, this indicates a 60% stenosis.  Left carotid system: Common carotid artery widely patent to the bifurcation. Soft and calcified plaque at the carotid bifurcation and ICA bulb. Minimal diameter of the ICA bulb is 2 mm. Compared to a more distal cervical ICA diameter of 4 mm, this indicates a 50% stenosis. Vertebral arteries: Both vertebral artery origins are widely patent. Both vertebral arteries are widely patent through the cervical region to the foramen magnum. Skeleton: Normal Other neck: Normal Upper chest: Normal Review of the MIP images confirms the above findings CTA HEAD FINDINGS Anterior circulation: Both internal carotid arteries are patent through the skull base and siphon regions. There is atherosclerotic calcification throughout the carotid siphons but without stenosis greater than 50% suspected. Supraclinoid ICA stenosis likely approaches 70%. The anterior and middle cerebral vessels show flow. No large or medium vessel occlusion identified. Atherosclerotic narrowing of the left A1 segment. Posterior circulation: Both vertebral arteries are patent to the basilar. No basilar stenosis. Posterior circulation branch vessels are patent. Venous sinuses: Patent and normal. Anatomic variants: None Delayed phase: No abnormal enhancement. Review of the MIP images confirms the above findings IMPRESSION: 1. Atherosclerotic disease at both carotid bifurcations. 60% stenosis of the proximal ICA on the right. 50% stenosis of the proximal ICA on the left. 2. Atherosclerotic disease throughout the carotid siphons but without stenosis greater than 50%. More severe stenosis of the supraclinoid internal carotid arteries on each side, approximately 70%. 3. No intracranial large or medium vessel occlusion or correctable proximal stenosis. No significant posterior circulation pathology. Electronically Signed   By: Nelson Chimes M.D.   On: 10/04/2018 13:54   Dg Chest Portable 1 View  Result Date: 10/04/2018 CLINICAL DATA:  Sudden onset of mental status  change. EXAM: PORTABLE CHEST 1 VIEW COMPARISON:  06/17/2018 FINDINGS: Heart size is normal. There is a poor inspiration. There may be venous hypertension but there is no frank edema, infiltrate, collapse or effusion. Vascular stent present on both sides, presumably venous. IMPRESSION: Poor inspiration. Possible venous hypertension without frank edema. Electronically Signed   By: Nelson Chimes M.D.   On: 10/04/2018 12:09    EKG: Orders placed or performed during the hospital encounter of 10/04/18  . ED EKG  . ED EKG    IMPRESSION AND PLAN: Patient is 58 year old end-stage renal disease patient presenting with syncopal episode  1.  Syncope will place under observation Obtain echocardiogram of the heart Monitor on telemetry Check orthostatic blood pressure  2.  Carotid artery stenosis unclear if that had anything to do with the syncope will ask vascular surgery to see treat with aspirin I have sent message to Dr. Delana Meyer  3.  Hypertension continue amlodipine  4.  Diabetes type 2 sliding scale insulin hold her NovoLog  5.  End-stage renal disease Dr. Zollie Scale has been notified of patient's admission for dialysis needed  6.  Hyperlipidemia continue Lipitor  7.  Miscellaneous heparin for DVT prophylaxis  All the records are reviewed and case discussed with ED provider. Management plans discussed with the patient, family and they are in agreement.  CODE STATUS: Code Status History    Date Active Date Inactive Code Status Order ID Comments User Context   06/18/2018 0449 06/20/2018 1642 Full Code [id number]  Arta Silence, MD ED   02/12/2018 1312 02/16/2018 0050 Full Code [id number]  Epifanio Lesches, MD ED   01/17/2018 1848 01/21/2018 1341 Full Code [id number]  Vaughan Basta, MD Inpatient   09/30/2017 1804 10/10/2017 2238 Full Code [id number]  Vaughan Basta, MD Inpatient   09/03/2017 2243 09/05/2017 1901 Full Code [id number]  Lance Coon, MD Inpatient   05/04/2017 1833  05/05/2017 2330 Full Code [id number]  Henreitta Leber, MD Inpatient   03/23/2017 1551 03/25/2017 1739 Full Code [id number]  Gladstone Lighter, MD Inpatient   04/21/2016 1559 04/21/2016 1717 Full Code [id number]  Nicholes Mango, MD Inpatient   04/08/2016 1415 04/11/2016 1947 Full Code [id number]  Theodoro Grist, MD Inpatient   03/15/2016 1322 03/20/2016 0019 Full Code [id number]  Bettey Costa, MD Inpatient   02/16/2016 1409 02/19/2016 2238 Full Code [id number]  Bettey Costa, MD ED   12/17/2015 1139 12/21/2015 2209 Full Code [id number]  Dustin Flock, MD ED   11/12/2015 1602 12/01/2015 1943 Full Code [id number]  Bettey Costa, MD Inpatient   10/07/2015 0937 10/12/2015 1959 Full Code [id number]  Fritzi Mandes, MD ED   10/03/2015 0355 10/05/2015 2230 Full Code [id number]  Lance Coon, MD Inpatient   08/31/2015 1600 09/03/2015 2209 Full Code [id number]  Demetrios Loll, MD ED   07/26/2015 1528 07/26/2015 2136 Full Code [id number]  Dionisio David, MD Inpatient   07/22/2015 1736 07/26/2015 1528 Full Code [id number]  Vaughan Basta, MD Inpatient   06/05/2015 2039 06/07/2015 2003 Full Code [id number]  Bettey Costa, MD Inpatient   05/21/2015 2142 05/24/2015 1626 Full Code [id number]  Henreitta Leber, MD Inpatient   04/24/2015 0855 04/26/2015 2211 Full Code [id number]  Demetrios Loll, MD Inpatient   04/01/2015 1558 04/05/2015 2007 Full Code [id number]  Epifanio Lesches, MD ED   03/06/2015 0805 03/09/2015 2111 Full Code [id number]  Juluis Mire, MD Inpatient   02/19/2015 1614 02/24/2015 1741 Full Code [id number]  Aldean Jewett, MD Inpatient       TOTAL TIME TAKING CARE OF THIS PATIENT: 35minutes.    Dustin Flock M.D on 10/04/2018 at 4:37 PM  Between 7am to 6pm - Pager - [phone]  After 6pm go to [email or web address] - password [email or web address] [facility]  [phone]  CC: Primary care physician; Freddy Finner, NP

## 2018-10-04 NOTE — ED Notes (Signed)
Admitting MD at bedside.

## 2018-10-04 NOTE — ED Notes (Signed)
Pts son is at bedside. PT resting in bed. Vitals stable and RN able to update son as best as possible.

## 2018-10-04 NOTE — ED Triage Notes (Signed)
Pt from home via ems- pt was talking with son around 20 when she went unresponsive suddenly. Pt was unresponsive for approx 20 min. When ems arrived pt began yelling and flailing arms around. Pt yelling upon arrival stating that her head is hurting.

## 2018-10-04 NOTE — ED Notes (Signed)
Attempted to call report without success

## 2018-10-04 NOTE — ED Provider Notes (Addendum)
[facility] Emergency Department Provider Note   ____________________________________________   First MD Initiated Contact with Patient 10/04/18 1101     (approximate)  I have reviewed the triage vital signs and the nursing notes.   HISTORY  Chief Complaint Altered Mental Status   HPILinda D [name] is a 58 y.o. female EMS reports patient was speaking with her son when she suddenly collapsed.  When she woke up she was confused.  She is complaining of severe headache.  She is moving all of her extremities.  She cannot give me any other history.  EMS reports she gets dialysis had dialysis on Friday.  Her son is on the way here.  Past Medical History:  Diagnosis Date  . Anemia   . Anginal pain ([place])   . Anxiety   . Arthritis   . Asthma   . Broken wrist   . Bronchitis   . chronic diastolic CHF 0/99/8338  . COPD (chronic obstructive pulmonary disease) ([place])   . Coronary artery disease    a. cath 2013: stenting to RCA (report not available); b. cath 2014: LM nl, pLAD 40%, mLAD nl, ost LCx 40%, mid LCx nl, pRCA 30% @ site of prior stent, mRCA 50%  . Depression   . Diabetes mellitus without complication ([place])   . Diabetic neuropathy ([place])   . dialysis 2006  . Diverticulosis   . Dizziness   . Dyspnea   . Elevated lipids   . Environmental and seasonal allergies   . ESRD (end stage renal disease) on dialysis ([place])    M-W-F  . GERD (gastroesophageal reflux disease)   . Headache   . History of hiatal hernia   . HOH (hard of hearing)   . Hx of pancreatitis 2015  . Hypertension   . Lower extremity edema   . Mitral regurgitation    a. echo 10/2013: EF 62%, noWMA, mildly dilated LA, mild to mod MR/TR, GR1DD  . Myocardial infarction ([place])   . Orthopnea   . Pneumonia   . Renal cancer ([place])   . Renal insufficiency    Pt is on dialysis on M,W + F.  . Wheezing     Patient Active Problem List   Diagnosis Date Noted  . Symptomatic anemia 06/18/2018  .  Gastroparesis due to DM ([place]) 01/18/2018  . Complication of vascular access for dialysis 12/04/2017  . Osteomyelitis ([place]) 09/30/2017  . Carotid stenosis 06/18/2017  . Shortness of breath 05/04/2017  . Cellulitis of lower extremity 07/29/2016  . Chronic venous insufficiency 07/29/2016  . Lymphedema 07/29/2016  . TIA (transient ischemic attack) 04/21/2016  . Altered mental status 04/08/2016  . Hyperammonemia ([place]) 04/08/2016  . Elevated troponin 04/08/2016  . Depression 04/08/2016  . Depression, major, recurrent, severe with psychosis ([place]) 04/08/2016  . Blood in stool   . Intractable cyclical vomiting with nausea   . Reflux esophagitis   . Gastritis   . Generalized abdominal pain   . Uncontrollable vomiting   . Major depressive disorder, recurrent episode, moderate ([place]) 03/15/2016  . Adjustment disorder with mixed anxiety and depressed mood 03/15/2016  . Malnutrition of moderate degree 12/01/2015  . Renal mass   . Dyspnea   . Acute renal failure ([place])   . Respiratory failure ([place])   . High temperature 11/14/2015  . Pulmonary edema   . Encounter for central line placement   . Encounter for orogastric (OG) tube placement   . Nausea 11/12/2015  . Hyperkalemia 10/03/2015  .  Diarrhea, unspecified 07/22/2015  . Pneumonia 05/21/2015  . Hypoglycemia 04/24/2015  . Unresponsiveness 04/24/2015  . Bradycardia 04/24/2015  . Hypothermia 04/24/2015  . Acute respiratory failure ([place]) 04/24/2015  . Acute diastolic CHF (congestive heart failure) ([place]) 04/05/2015  . Diabetic gastroparesis ([place]) 04/05/2015  . Hypokalemia 04/05/2015  . Generalized weakness 04/05/2015  . Acute pulmonary edema ([place]) 04/03/2015  . Nausea and vomiting 04/03/2015  . Hypoglycemia associated with diabetes ([place]) 04/03/2015  . Anemia of chronic disease 04/03/2015  . Secondary hyperparathyroidism ([place]) 04/03/2015  . Pressure ulcer 04/02/2015  . Acute respiratory failure with hypoxia ([place]) 04/01/2015  .  Adjustment disorder with anxiety 03/14/2015  . Somatic symptom disorder, mild 03/08/2015  . Coronary artery disease involving native coronary artery of native heart without angina pectoris   . Nausea & vomiting 03/06/2015  . Abdominal pain 03/06/2015  . DM (diabetes mellitus) ([place]) 03/06/2015  . HTN (hypertension) 03/06/2015  . Gastroparesis 02/24/2015  . Pleural effusion 02/19/2015  . HCAP (healthcare-associated pneumonia) 02/19/2015  . End-stage renal disease on hemodialysis ([place]) 02/19/2015    Past Surgical History:  Procedure Laterality Date  . A/V SHUNTOGRAM Left 01/20/2018   Procedure: A/V SHUNTOGRAM;  Surgeon: Algernon Huxley, MD;  Location: [place] [facility];  Service: Cardiovascular;  Laterality: Left;  . ABDOMINAL HYSTERECTOMY  1992  . AMPUTATION TOE Left 10/02/2017   Procedure: AMPUTATION TOE-LEFT GREAT TOE;  Surgeon: Albertine Patricia, DPM;  Location: [facility] ORS;  Service: Podiatry;  Laterality: Left;  . APPENDECTOMY    . CARDIAC CATHETERIZATION Left 07/26/2015   Procedure: Left Heart Cath and Coronary Angiography;  Surgeon: Dionisio David, MD;  Location: [place] [facility];  Service: Cardiovascular;  Laterality: Left;  . CATARACT EXTRACTION W/ INTRAOCULAR LENS IMPLANT Right   . CATARACT EXTRACTION W/PHACO Left 03/10/2017   Procedure: CATARACT EXTRACTION PHACO AND INTRAOCULAR LENS PLACEMENT (IOC);  Surgeon: Birder Robson, MD;  Location: [facility] ORS;  Service: Ophthalmology;  Laterality: Left;  [place] 00:51.9 AP% 14.2 CDE 7.39 fluid pack lot # [id number]  . CHOLECYSTECTOMY    . COLONOSCOPY WITH PROPOFOL N/A 08/12/2016   Procedure: COLONOSCOPY WITH PROPOFOL;  Surgeon: Lollie Sails, MD;  Location[facility] ENDOSCOPY;  Service: Endoscopy;  Laterality: N/A;  . DIALYSIS FISTULA CREATION Left    upper arm  . dialysis grafts    . ESOPHAGOGASTRODUODENOSCOPY N/A 03/08/2015   Procedure: ESOPHAGOGASTRODUODENOSCOPY (EGD);  Surgeon: Manya Silvas, MD;  Location[facility];  Service:  Endoscopy;  Laterality: N/A;  . ESOPHAGOGASTRODUODENOSCOPY (EGD) WITH PROPOFOL N/A 03/18/2016   Procedure: ESOPHAGOGASTRODUODENOSCOPY (EGD) WITH PROPOFOL;  Surgeon: [name], MD;  Location: [facility] ENDOSCOPY;  Service: Endoscopy;  Laterality: N/A;  . EYE SURGERY Right 2018  . FECAL TRANSPLANT N/A 08/23/2015   Procedure: FECAL TRANSPLANT;  Surgeon: Manya Silvas, MD;  Location: [facility] ENDOSCOPY;  Service: Endoscopy;  Laterality: N/A;  . HAND SURGERY Bilateral   . PERIPHERAL VASCULAR CATHETERIZATION N/A 12/20/2015   Procedure: Thrombectomy of dialysis access versus permcath placement;  Surgeon: Algernon Huxley, MD;  Location: [place] [facility];  Service: Cardiovascular;  Laterality: N/A;  . PERIPHERAL VASCULAR CATHETERIZATION N/A 12/20/2015   Procedure: A/V Shunt Intervention;  Surgeon: Algernon Huxley, MD;  Location: [place] [facility];  Service: Cardiovascular;  Laterality: N/A;  . PERIPHERAL VASCULAR CATHETERIZATION N/A 12/20/2015   Procedure: A/V Shuntogram/Fistulagram;  Surgeon: Algernon Huxley, MD;  Location: [place] [facility];  Service: Cardiovascular;  Laterality: N/A;  . PERIPHERAL VASCULAR CATHETERIZATION N/A 01/02/2016   Procedure:  A/V Shuntogram/Fistulagram;  Surgeon: Algernon Huxley, MD;  Location: [place] [facility];  Service: Cardiovascular;  Laterality: N/A;  . PERIPHERAL VASCULAR CATHETERIZATION N/A 01/02/2016   Procedure: A/V Shunt Intervention;  Surgeon: Algernon Huxley, MD;  Location: [place] [facility];  Service: Cardiovascular;  Laterality: N/A;    Prior to Admission medications   Medication Sig Start Date End Date Taking? Authorizing Provider  albuterol (PROVENTIL HFA;VENTOLIN HFA) 108 (90 Base) MCG/ACT inhaler Inhale 2 puffs into the lungs every 6 (six) hours as needed for wheezing or shortness of breath. 02/17/18   Merlyn Lot, MD  albuterol (PROVENTIL) (2.5 MG/3ML) 0.083% nebulizer solution Take 2.5 mg by nebulization every 4 (four) hours as needed for wheezing or shortness of  breath.    [provider]  amLODipine (NORVASC) 10 MG tablet Take 10 mg by mouth daily.    [provider]  aspirin EC 81 MG tablet Take 81 mg by mouth daily. 09/08/16   [provider]  atorvastatin (LIPITOR) 20 MG tablet Take 20 mg by mouth every evening.  12/02/16   [provider]  Biotin 1 MG CAPS Take 1 mg by mouth daily.    [provider]  cetirizine (ZYRTEC) 10 MG tablet Take 10 mg by mouth daily.     [provider]  cholecalciferol (VITAMIN D) 400 units TABS tablet Take 400 Units by mouth daily.    [provider]  cyanocobalamin 1000 MCG tablet Take 1,000 mcg by mouth daily.     [provider]  diclofenac sodium (VOLTAREN) 1 % GEL Apply 1 application topically daily as needed (for rash).    [provider]  ferrous sulfate (CVS IRON) 325 (65 FE) MG tablet Take 325 mg by mouth daily with breakfast.    [provider]  FLUoxetine (PROZAC) 20 MG capsule Take 1 capsule (20 mg total) by mouth daily. Patient taking differently: Take 20 mg by mouth at bedtime.  04/10/16   Hower, Aaron Mose, MD  hydrocortisone cream 0.5 % Apply topically 3 (three) times daily. 01/21/18   Wieting, Richard, MD  insulin aspart (NOVOLOG FLEXPEN) 100 UNIT/ML FlexPen Inject 4-15 Units into the skin 4 (four) times daily as needed for high blood sugar (> 100, per sliding scale).    [provider]  lidocaine (LIDODERM) 5 % Place 1 patch onto the skin every 12 (twelve) hours. Remove & Discard patch within 12 hours or as directed by MD 02/20/18 02/20/19  Nance Pear, MD  lipase/protease/amylase (CREON) 12000 UNITS CPEP capsule Take 36000 mg in the morning, 24000 midday & 36000 in the evening    [provider]  LORazepam (ATIVAN PO) Take by mouth.    [provider]  multivitamin (RENA-VIT) TABS tablet Take 1 tablet by mouth daily.     [provider]  nitroGLYCERIN (NITROSTAT) 0.4 MG SL tablet Place  0.4 mg under the tongue every 5 (five) minutes as needed for chest pain.     [provider]  ranitidine (ZANTAC) 300 MG capsule Take 1 capsule by mouth every evening. 01/19/18   [provider]  sevelamer carbonate (RENVELA) 800 MG tablet Take 800-2,400 mg by mouth See admin instructions. Take 2400 mg by mouth 3 times daily with meals and take 800 mg by mouth with snacks    [provider]    Allergies Ace inhibitors; Ativan [lorazepam]; Compazine [prochlorperazine edisylate]; Sumatriptan succinate; Dilaudid [hydromorphone hcl]; Ondansetron; Zofran [ondansetron hcl]; Codeine; Gabapentin; Lac bovis; Losartan; Oxycodone;  Prochlorperazine; Reglan [metoclopramide]; Scopolamine; Tape; and Tapentadol  Family History  Problem Relation Age of Onset  . Kidney disease Mother   . Diabetes Mother   . Cancer Father   . Kidney disease Sister     Social History Social History   Tobacco Use  . Smoking status: Former Smoker    Packs/day: 0.50    Years: 40.00    Pack years: 20.00    Types: Cigarettes    Last attempt to quit: 02/13/2015    Years since quitting: 3.6  . Smokeless tobacco: Never Used  Substance Use Topics  . Alcohol use: Not Currently    Comment: glass wine week per pt  . Drug use: Yes    Types: Marijuana    Comment: once a day    Review of Systems Unable to obtain ____________________________________________   PHYSICAL EXAM:  VITAL SIGNS: ED Triage Vitals [10/04/18 1104]  Enc Vitals Group     BP      Pulse      Resp      Temp      Temp src      SpO2      Weight 187 lb 6.3 oz (85 kg)     Height 5\' 5"  (1.651 m)     Head Circumference      Peak Flow      Pain Score 10     Pain Loc      Pain Edu?      Excl. in [place]?     Constitutional: Awake but confused keeps saying her head hurts tries to get out of the bed. Eyes: Conjunctivae are normal. PERRL. EOMI. Head: Atraumatic. Nose: No congestion/rhinnorhea. Mouth/Throat: Mucous membranes  are moist.  Oropharynx non-erythematous. Neck: No stridor. Cardiovascular: Normal rate, regular rhythm. Grossly normal heart sounds.  Good peripheral circulation. Respiratory: Normal respiratory effort.  No retractions. Lungs CTAB. Gastrointestinal: Soft and nontender. No distention. No abdominal bruits. No CVA tenderness. Musculoskeletal: No lower extremity tenderness nor edema.  No joint effusions. Neurologic: Speaks clearly does make sounds on occasion does not want to follow commands that she can squeeze my hand she cannot move her legs but then moves her legs and turns around and grabs things purposefully.  Is moving all extremities equally and well Skin:  Skin is warm, dry and intact. No rash noted.   ____________________________________________   LABS (all labs ordered are listed, but only abnormal results are displayed)  Labs Reviewed  COMPREHENSIVE METABOLIC PANEL - Abnormal; Notable for the following components:      Result Value   Glucose, Bld 159 (*)    BUN 38 (*)    Creatinine, Ser 9.04 (*)    Calcium 8.1 (*)    GFR calc non Af Amer 4 (*)    GFR calc Af Amer 5 (*)    All other components within normal limits  ACETAMINOPHEN LEVEL - Abnormal; Notable for the following components:   Acetaminophen (Tylenol), Serum <10 (*)    All other components within normal limits  LIPASE, BLOOD - Abnormal; Notable for the following components:   Lipase 83 (*)    All other components within normal limits  CBC WITH DIFFERENTIAL/PLATELET - Abnormal; Notable for the following components:   RBC 3.28 (*)    Hemoglobin 9.7 (*)    HCT 30.7 (*)    RDW 15.8 (*)    All other components within normal limits  BRAIN NATRIURETIC PEPTIDE - Abnormal; Notable for the following components:   B  Natriuretic Peptide 348.0 (*)    All other components within normal limits  GLUCOSE, CAPILLARY - Abnormal; Notable for the following components:   Glucose-Capillary 142 (*)    All other components within  normal limits  ETHANOL  TROPONIN I  LACTIC ACID, PLASMA  SALICYLATE LEVEL  AMMONIA  LACTIC ACID, PLASMA  URINALYSIS, COMPLETE (UACMP) WITH MICROSCOPIC  BLOOD GAS, ARTERIAL  BLOOD GAS, VENOUS  SEDIMENTATION RATE   ____________________________________________  EKG  EKG read and interpreted by me shows normal sinus rhythm rate of 66 normal axis essentially normal EKG ____________________________________________  RADIOLOGY  ED MD interpretation: CT shows no acute pathology there are a number of blockages on the angiogram chest x-ray does not show any CHF.  Films were read by radiology I reviewed the x-rays and the CT scan not the angiograms.  Official radiology report(s): Ct Angio Head W Or Wo Contrast  Result Date: 10/04/2018 CLINICAL DATA:  Sudden onset of unresponsiveness beginning this morning. Negative head CT. EXAM: CT ANGIOGRAPHY HEAD AND NECK TECHNIQUE: Multidetector CT imaging of the head and neck was performed using the standard protocol during bolus administration of intravenous contrast. Multiplanar CT image reconstructions and MIPs were obtained to evaluate the vascular anatomy. Carotid stenosis measurements (when applicable) are obtained utilizing NASCET criteria, using the distal internal carotid diameter as the denominator. CONTRAST:  43mL OMNIPAQUE IOHEXOL 350 MG/ML SOLN COMPARISON:  Head CT earlier same day. MRI 04/22/2016. FINDINGS: CTA NECK FINDINGS Aortic arch: Mild aortic atherosclerosis. No aneurysm or dissection. Branching pattern of the brachiocephalic vessels is normal. 20% stenosis of the left subclavian artery origin. Right carotid system: Common carotid artery widely patent to the bifurcation. Soft and calcified plaque at the bifurcation and ICA bulb. Minimal diameter in the ICA bulb is 1.8 mm. Compared to a more distal cervical ICA diameter of 4.6 mm, this indicates a 60% stenosis. Left carotid system: Common carotid artery widely patent to the bifurcation. Soft  and calcified plaque at the carotid bifurcation and ICA bulb. Minimal diameter of the ICA bulb is 2 mm. Compared to a more distal cervical ICA diameter of 4 mm, this indicates a 50% stenosis. Vertebral arteries: Both vertebral artery origins are widely patent. Both vertebral arteries are widely patent through the cervical region to the foramen magnum. Skeleton: Normal Other neck: Normal Upper chest: Normal Review of the MIP images confirms the above findings CTA HEAD FINDINGS Anterior circulation: Both internal carotid arteries are patent through the skull base and siphon regions. There is atherosclerotic calcification throughout the carotid siphons but without stenosis greater than 50% suspected. Supraclinoid ICA stenosis likely approaches 70%. The anterior and middle cerebral vessels show flow. No large or medium vessel occlusion identified. Atherosclerotic narrowing of the left A1 segment. Posterior circulation: Both vertebral arteries are patent to the basilar. No basilar stenosis. Posterior circulation branch vessels are patent. Venous sinuses: Patent and normal. Anatomic variants: None Delayed phase: No abnormal enhancement. Review of the MIP images confirms the above findings IMPRESSION: 1. Atherosclerotic disease at both carotid bifurcations. 60% stenosis of the proximal ICA on the right. 50% stenosis of the proximal ICA on the left. 2. Atherosclerotic disease throughout the carotid siphons but without stenosis greater than 50%. More severe stenosis of the supraclinoid internal carotid arteries on each side, approximately 70%. 3. No intracranial large or medium vessel occlusion or correctable proximal stenosis. No significant posterior circulation pathology. Electronically Signed   By: Nelson Chimes M.D.   On: 10/04/2018 13:54   Ct Head Wo  Contrast  Result Date: 10/04/2018 CLINICAL DATA:  Headache and altered level of consciousness. EXAM: CT HEAD WITHOUT CONTRAST TECHNIQUE: Contiguous axial images were  obtained from the base of the skull through the vertex without intravenous contrast. COMPARISON:  08/14/2017 FINDINGS: Brain: The brain shows a normal appearance without evidence of malformation, atrophy, old or acute small or large vessel infarction, mass lesion, hemorrhage, hydrocephalus or extra-axial collection. Vascular: No hyperdense vessel. No evidence of atherosclerotic calcification. Skull: Normal. No traumatic finding. No focal bone lesion. Sinuses/Orbits: Sinuses are clear. Orbits appear normal. Mastoids are clear. Other: None significant IMPRESSION: Normal head CT. Electronically Signed   By: Nelson Chimes M.D.   On: 10/04/2018 11:27   Ct Angio Neck W And/or Wo Contrast  Result Date: 10/04/2018 CLINICAL DATA:  Sudden onset of unresponsiveness beginning this morning. Negative head CT. EXAM: CT ANGIOGRAPHY HEAD AND NECK TECHNIQUE: Multidetector CT imaging of the head and neck was performed using the standard protocol during bolus administration of intravenous contrast. Multiplanar CT image reconstructions and MIPs were obtained to evaluate the vascular anatomy. Carotid stenosis measurements (when applicable) are obtained utilizing NASCET criteria, using the distal internal carotid diameter as the denominator. CONTRAST:  61mL OMNIPAQUE IOHEXOL 350 MG/ML SOLN COMPARISON:  Head CT earlier same day. MRI 04/22/2016. FINDINGS: CTA NECK FINDINGS Aortic arch: Mild aortic atherosclerosis. No aneurysm or dissection. Branching pattern of the brachiocephalic vessels is normal. 20% stenosis of the left subclavian artery origin. Right carotid system: Common carotid artery widely patent to the bifurcation. Soft and calcified plaque at the bifurcation and ICA bulb. Minimal diameter in the ICA bulb is 1.8 mm. Compared to a more distal cervical ICA diameter of 4.6 mm, this indicates a 60% stenosis. Left carotid system: Common carotid artery widely patent to the bifurcation. Soft and calcified plaque at the carotid  bifurcation and ICA bulb. Minimal diameter of the ICA bulb is 2 mm. Compared to a more distal cervical ICA diameter of 4 mm, this indicates a 50% stenosis. Vertebral arteries: Both vertebral artery origins are widely patent. Both vertebral arteries are widely patent through the cervical region to the foramen magnum. Skeleton: Normal Other neck: Normal Upper chest: Normal Review of the MIP images confirms the above findings CTA HEAD FINDINGS Anterior circulation: Both internal carotid arteries are patent through the skull base and siphon regions. There is atherosclerotic calcification throughout the carotid siphons but without stenosis greater than 50% suspected. Supraclinoid ICA stenosis likely approaches 70%. The anterior and middle cerebral vessels show flow. No large or medium vessel occlusion identified. Atherosclerotic narrowing of the left A1 segment. Posterior circulation: Both vertebral arteries are patent to the basilar. No basilar stenosis. Posterior circulation branch vessels are patent. Venous sinuses: Patent and normal. Anatomic variants: None Delayed phase: No abnormal enhancement. Review of the MIP images confirms the above findings IMPRESSION: 1. Atherosclerotic disease at both carotid bifurcations. 60% stenosis of the proximal ICA on the right. 50% stenosis of the proximal ICA on the left. 2. Atherosclerotic disease throughout the carotid siphons but without stenosis greater than 50%. More severe stenosis of the supraclinoid internal carotid arteries on each side, approximately 70%. 3. No intracranial large or medium vessel occlusion or correctable proximal stenosis. No significant posterior circulation pathology. Electronically Signed   By: Nelson Chimes M.D.   On: 10/04/2018 13:54   Dg Chest Portable 1 View  Result Date: 10/04/2018 CLINICAL DATA:  Sudden onset of mental status change. EXAM: PORTABLE CHEST 1 VIEW COMPARISON:  06/17/2018 FINDINGS: Heart size  is normal. There is a poor  inspiration. There may be venous hypertension but there is no frank edema, infiltrate, collapse or effusion. Vascular stent present on both sides, presumably venous. IMPRESSION: Poor inspiration. Possible venous hypertension without frank edema. Electronically Signed   By: Nelson Chimes M.D.   On: 10/04/2018 12:09    ____________________________________________   PROCEDURES  Procedure(s) performed:   Procedures  Critical Care performed: Critical care time 38 minutes.  This includes examining the patient several times discussing the patient with her son trying repeatedly to get a history out of her and discussing the patient with the hospitalist as well as reviewing the films  ____________________________________________   INITIAL IMPRESSION / ASSESSMENT AND PLAN / ED COURSE  ----------------------------------------- 3:33 PM on 10/04/2018 -----------------------------------------  Patient had woke up earlier and said that her headache was gone.  Now her son is arrived and her headache is back again and somewhat tender to palpation on the right temple.  I will send a sed rate.  Her son does not want to take her back home because he says she cannot walk by herself.  She is too weak.  I will try and see if the patient can walk.  If she cannot walk she may need to go to a nursing home for rehab.  I told her this.  We will see what develops with the walking with the walker and go from there.    ----------------------------------------- 3:47 PM on 10/04/2018 -----------------------------------------  Patient stands with walker pulse does not change she says she is dizzy and will not walk.  She did have syncope while sitting today.  I will plan on admitting her overnight and working on nursing home placement for rehab tomorrow.  We will get the sed rate back also.   ____________________________________________   FINAL CLINICAL IMPRESSION(S) / ED DIAGNOSES  Final diagnoses:  Right-sided  headache  Transient alteration of awareness  Syncope, unspecified syncope type  Weakness     ED Discharge Orders    None       Note:  This document was prepared using Dragon voice recognition software and may include unintentional dictation errors.    Nena Polio, MD 10/04/18 1534    Nena Polio, MD 10/04/18 1548    Nena Polio, MD 10/14/18 7001    Nena Polio, MD 10/14/18 [phone]

## 2018-10-04 NOTE — Progress Notes (Signed)
Patient admitted at 77. No complaints of pain. patient requested food. Admission profile completed. Blood pressure elevated, patient did not take medication at home. Patient was very dizzy upon standing. Orthostatic performed.

## 2018-10-04 NOTE — Progress Notes (Signed)
Advanced care plan.  Purpose of the Encounter: CODE STATUS  Parties in Attendance: Patient herself  Patient's Decision Capacity: Intact  Subjective/Patient's story: [name]  is a 58 y.o. female with a known history of end-stage renal disease, peripheral vascular disease, COPD, chronic diastolic CHF, coronary artery disease, diabetes type 2, diabetic neuropathy, hyperlipidemia, GERD who is presenting to the hospital with syncopal    Objective/Medical story Patient with multiple chronic medical problems I discussed with her regarding her desires for cardiac and pulmonary resuscitation   Goals of care determination:   Patient states that she would like everything to be done and be a full code  CODE STATUS: Full code   Time spent discussing advanced care planning: 16 minutes

## 2018-10-05 ENCOUNTER — Encounter (INDEPENDENT_AMBULATORY_CARE_PROVIDER_SITE_OTHER): Payer: Self-pay | Admitting: Vascular Surgery

## 2018-10-05 LAB — CBC
HCT: 28.8 % — ABNORMAL LOW (ref 36.0–46.0)
Hemoglobin: 9.5 g/dL — ABNORMAL LOW (ref 12.0–15.0)
MCH: 31.3 pg (ref 26.0–34.0)
MCHC: 33 g/dL (ref 30.0–36.0)
MCV: 94.7 fL (ref 80.0–100.0)
Platelets: 198 10*3/uL (ref 150–400)
RBC: 3.04 MIL/uL — ABNORMAL LOW (ref 3.87–5.11)
RDW: 17 % — ABNORMAL HIGH (ref 11.5–15.5)
WBC: 5.8 10*3/uL (ref 4.0–10.5)
nRBC: 0 % (ref 0.0–0.2)

## 2018-10-05 LAB — BASIC METABOLIC PANEL
Anion gap: 12 (ref 5–15)
BUN: 41 mg/dL — ABNORMAL HIGH (ref 6–20)
CO2: 21 mmol/L — ABNORMAL LOW (ref 22–32)
Calcium: 7.8 mg/dL — ABNORMAL LOW (ref 8.9–10.3)
Chloride: 102 mmol/L (ref 98–111)
Creatinine, Ser: 10.03 mg/dL — ABNORMAL HIGH (ref 0.44–1.00)
GFR calc Af Amer: 4 mL/min — ABNORMAL LOW (ref >60)
GFR calc non Af Amer: 4 mL/min — ABNORMAL LOW (ref >60)
Glucose, Bld: 97 mg/dL (ref 70–99)
Potassium: 5.1 mmol/L (ref 3.5–5.1)
Sodium: 135 mmol/L (ref 135–145)

## 2018-10-05 LAB — GLUCOSE, CAPILLARY
Glucose-Capillary: 93 mg/dL (ref 70–99)
Glucose-Capillary: 169 mg/dL — ABNORMAL HIGH (ref 70–99)
Glucose-Capillary: 98 mg/dL (ref 70–99)
Glucose-Capillary: 169 mg/dL — ABNORMAL HIGH (ref 70–99)

## 2018-10-05 LAB — PHOSPHORUS: Phosphorus: 5.3 mg/dL — ABNORMAL HIGH (ref 2.5–4.6)

## 2018-10-05 LAB — TROPONIN I
Troponin I: <0.03 ng/mL (ref <0.03)
Troponin I: <0.03 ng/mL (ref <0.03)

## 2018-10-05 MED ORDER — ALTEPLASE 2 MG IJ SOLR: 2.0000 mg | Freq: Once | INTRAMUSCULAR | Status: DC | PRN

## 2018-10-05 MED ORDER — HEPARIN SODIUM (PORCINE) 1000 UNIT/ML DIALYSIS
1000.0000 [IU] | INTRAMUSCULAR | Status: DC | PRN
  Filled 2018-10-05: qty 1

## 2018-10-05 MED ORDER — SODIUM CHLORIDE 0.9 % IV SOLN: 100.0000 mL | INTRAVENOUS | Status: DC | PRN

## 2018-10-05 MED ORDER — IPRATROPIUM-ALBUTEROL 0.5-2.5 (3) MG/3ML IN SOLN
3.0000 mL | Freq: Four times a day (QID) | RESPIRATORY_TRACT | Status: DC
  Administered 2018-10-05 – 2018-10-06 (×4): 3 mL via RESPIRATORY_TRACT
  Filled 2018-10-05 (×4): qty 3

## 2018-10-05 MED ORDER — MECLIZINE HCL 25 MG PO TABS
25.0000 mg | ORAL_TABLET | Freq: Two times a day (BID) | ORAL | Status: DC
  Administered 2018-10-05 – 2018-10-11 (×12): 25 mg via ORAL
  Filled 2018-10-05 (×15): qty 1

## 2018-10-05 MED ORDER — LIDOCAINE-PRILOCAINE 2.5-2.5 % EX CREA
1.0000 "application " | TOPICAL_CREAM | CUTANEOUS | Status: DC | PRN
  Filled 2018-10-05: qty 5

## 2018-10-05 MED ORDER — PENTAFLUOROPROP-TETRAFLUOROETH EX AERO
1.0000 "application " | INHALATION_SPRAY | CUTANEOUS | Status: DC | PRN
  Filled 2018-10-05: qty 30

## 2018-10-05 MED ORDER — METHYLPREDNISOLONE SODIUM SUCC 125 MG IJ SOLR: 60.0000 mg | INTRAMUSCULAR | Status: DC

## 2018-10-05 MED ORDER — PROMETHAZINE HCL 25 MG PO TABS
25.0000 mg | ORAL_TABLET | Freq: Four times a day (QID) | ORAL | Status: DC | PRN
  Administered 2018-10-05 – 2018-10-06 (×3): 25 mg via ORAL
  Filled 2018-10-05 (×4): qty 1

## 2018-10-05 MED ORDER — CHLORHEXIDINE GLUCONATE CLOTH 2 % EX PADS
6.0000 | MEDICATED_PAD | Freq: Every day | CUTANEOUS | Status: DC
  Administered 2018-10-05 – 2018-10-10 (×3): 6 via TOPICAL

## 2018-10-05 MED ORDER — LIDOCAINE HCL (PF) 1 % IJ SOLN
5.0000 mL | INTRAMUSCULAR | Status: DC | PRN
  Filled 2018-10-05: qty 5

## 2018-10-05 NOTE — Progress Notes (Signed)
Post HD assessment. Pt tolerated tx well without c/o or complication. Net UF 1507, goal met.    10/05/18 1814  Vital Signs  Temp 98.5 F (36.9 C)  Temp Source Oral  Pulse Rate 66  Pulse Rate Source Monitor  Resp 17  BP (!) 172/69  BP Location Right Arm  BP Method Automatic  Patient Position (if appropriate) Lying  Oxygen Therapy  SpO2 98 %  O2 Device Room Air  Dialysis Weight  Weight 86.9 kg  Type of Weight Post-Dialysis  Post-Hemodialysis Assessment  Rinseback Volume (mL) 250 mL  KECN 60.2 V  Dialyzer Clearance Lightly streaked  Duration of HD Treatment -hour(s) 3.5 hour(s)  Hemodialysis Intake (mL) 500 mL  UF Total -Machine (mL) 2007 mL  Net UF (mL) 1507 mL  Tolerated HD Treatment Yes  AVG/AVF Arterial Site Held (minutes) 10 minutes  AVG/AVF Venous Site Held (minutes) 15 minutes  Education / Care Plan  Dialysis Education Provided Yes  Documented Education in Care Plan Yes

## 2018-10-05 NOTE — Evaluation (Signed)
Physical Therapy Evaluation Patient Details Name: [name] MRN: [id number] DOB: November 19, 1960 Today's Date: 10/05/2018   History of Present Illness  Pt is a 58 year old female admitted s/p syncopal episode and c/o vomiting and diarrhea.  PMH includes DM II, ESRD (currently on dialysis), Htn and HLD  Clinical Impression  Pt is a 58 year old female who lives alone and ambulates limited community distances with a SPC at baseline.  She has just awakened upon PT arrival but was otherwise alert and oriented.  Pt able to perform bed mobility mod I and sit at EOB without assistance.  She stood with CGA and use of RW but reported sharp pain increase in low back (chronic LBP) and feeling of weakness.  Pt did appear unsteady on feet and reported feelings of dizziness following 4-5 steps.  She was able to back to the bed and return to bed.  Pt's BP remained WNL to borderline hypertensive throughout.  Pt presented with fair strength of LE's and poor strength of UE's.  She will continue to benefit from skilled PT with focus on strength, tolerance to activity and balance.  Pt will benefit from home health PT following discharge and will need supervision for OOB mobility due to recent fall hx and current weakness.    Follow Up Recommendations Home health PT;Supervision - Intermittent;Supervision for mobility/OOB    Equipment Recommendations  None recommended by PT    Recommendations for Other Services       Precautions / Restrictions Precautions Precautions: Fall Restrictions Weight Bearing Restrictions: No      Mobility  Bed Mobility Overal bed mobility: Modified Independent             General bed mobility comments: Pt very slow to move but able to perform bed mobility, including scooting up in bed, on her own.  Transfers Overall transfer level: Needs assistance Equipment used: Rolling walker (2 wheeled) Transfers: Sit to/from Stand Sit to Stand: Min guard         General transfer  comment: Pt able to rise from bedside without assistance; close monitoring provided by PT due to pt reported sudden onset of pain and pt reporting that she felt very weak.  Ambulation/Gait Ambulation/Gait assistance: Min guard Gait Distance (Feet): 5 Feet Assistive device: Rolling walker (2 wheeled)     Gait velocity interpretation: <1.8 ft/sec, indicate of risk for recurrent falls General Gait Details: Low foot clearance and step length, pt stopped and stated that she felt the "floor was moving" and that she "was not".  Pt slowly returned back to bed, eyes remaining closed throughout.  RN notified.  Stairs            Wheelchair Mobility    Modified Rankin (Stroke Patients Only)       Balance Overall balance assessment: Needs assistance Sitting-balance support: Feet supported Sitting balance-Leahy Scale: Fair     Standing balance support: Bilateral upper extremity supported Standing balance-Leahy Scale: Fair Standing balance comment: Pt reliant on RW at this time as compared to normal use of SPC.                             Pertinent Vitals/Pain Pain Assessment: Faces Faces Pain Scale: Hurts even more Pain Location: Reported 0/10 pain at rest but later reported onset of a HA and LBP when standing.  Pt stated that the LBP is chronic. Pain Descriptors / Indicators: Moaning;Aching Pain Intervention(s): Limited activity within  patient's tolerance;Monitored during session;Patient requesting pain meds-RN notified    Home Living Family/patient expects to be discharged to:: Private residence Living Arrangements: Alone Available Help at Discharge: Family;Available PRN/intermittently(Son and sister) Type of Home: House Home Access: Level entry     Home Layout: One level Home Equipment: Walker - 2 wheels;Cane - single point      Prior Function Level of Independence: Independent with assistive device(s)         Comments: Pt able to manage ADL's without  assistance; uses [facility] [address] for household and limited community mobility and does not drive herself to appointments.     Hand Dominance        Extremity/Trunk Assessment   Upper Extremity Assessment Upper Extremity Assessment: Generalized weakness;RUE deficits/detail(Grip strength 4/5, pt participated minimally in UE MMT beyond that.)    Lower Extremity Assessment Lower Extremity Assessment: Overall WFL for tasks assessed(Grossly 4-/5 with pt reported DM neuropathy in "feet and legs".)    Cervical / Trunk Assessment Cervical / Trunk Assessment: Kyphotic  Communication   Communication: HOH  Cognition Arousal/Alertness: Awake/alert Behavior During Therapy: WFL for tasks assessed/performed Overall Cognitive Status: Within Functional Limits for tasks assessed                                        General Comments      Exercises Other Exercises Other Exercises: Time taken to monitor BP.  Pt's BP remained borderline hypertensive and pt did not appear to become orthostatic despite report of dizziness.   Assessment/Plan    PT Assessment Patient needs continued PT services  PT Problem List Decreased strength;Decreased mobility;Decreased activity tolerance       PT Treatment Interventions DME instruction;Functional mobility training;Balance training;Patient/family education;Gait training;Therapeutic activities;Stair training;Therapeutic exercise    PT Goals (Current goals can be found in the Care Plan section)  Acute Rehab PT Goals Patient Stated Goal: To return home and continue with hemodialysis. PT Goal Formulation: With patient Time For Goal Achievement: 10/19/18 Potential to Achieve Goals: Good    Frequency Min 2X/week   Barriers to discharge        Co-evaluation               AM-PAC PT "6 Clicks" Mobility  Outcome Measure Help needed turning from your back to your side while in a flat bed without using bedrails?: None Help needed moving from  lying on your back to sitting on the side of a flat bed without using bedrails?: None Help needed moving to and from a bed to a chair (including a wheelchair)?: A Little Help needed standing up from a chair using your arms (e.g., wheelchair or bedside chair)?: A Little Help needed to walk in hospital room?: A Little Help needed climbing 3-5 steps with a railing? : A Lot 6 Click Score: 19    End of Session Equipment Utilized During Treatment: Gait belt Activity Tolerance: Patient limited by fatigue;Patient limited by pain Patient left: in bed;with call bell/phone within reach;with bed alarm set Nurse Communication: Mobility status PT Visit Diagnosis: Unsteadiness on feet (R26.81);Muscle weakness (generalized) (M62.81);History of falling (Z91.81)    Time: 3903-0092 PT Time Calculation (min) (ACUTE ONLY): 21 min   Charges:   PT Evaluation $PT Eval Low Complexity: 1 Low          Roxanne Gates, PT, DPT   Roxanne Gates 10/05/2018, 9:18 AM

## 2018-10-05 NOTE — Progress Notes (Signed)
HD tx end    10/05/18 1803  Vital Signs  Pulse Rate 66  Pulse Rate Source Monitor  Resp 18  BP (!) 184/70  BP Location Right Arm  BP Method Automatic  Patient Position (if appropriate) Lying  Oxygen Therapy  SpO2 96 %  O2 Device Room Air  During Hemodialysis Assessment  Dialysis Fluid Bolus Normal Saline  Bolus Amount (mL) 250 mL  Intra-Hemodialysis Comments Tx completed

## 2018-10-05 NOTE — Progress Notes (Signed)
[facility] [place] NAME: [name]    MR#:  [id number]  DATE OF BIRTH:  1961-03-15  SUBJECTIVE:  CHIEF COMPLAINT:   Chief Complaint  Patient presents with  . Altered Mental Status   Sitting at side of the bed.   Complains of cough, sinus congestion, headache, dizziness No further syncopal episodes  REVIEW OF SYSTEMS:    Review of Systems  Constitutional: Positive for malaise/fatigue. Negative for chills and fever.  HENT: Positive for sinus pain. Negative for sore throat.   Eyes: Negative for blurred vision, double vision and pain.  Respiratory: Positive for cough and wheezing. Negative for hemoptysis and shortness of breath.   Cardiovascular: Negative for chest pain, palpitations, orthopnea and leg swelling.  Gastrointestinal: Negative for abdominal pain, constipation, diarrhea, heartburn, nausea and vomiting.  Genitourinary: Negative for dysuria and hematuria.  Musculoskeletal: Negative for back pain and joint pain.  Skin: Negative for rash.  Neurological: Negative for sensory change, speech change, focal weakness and headaches.  Endo/Heme/Allergies: Does not bruise/bleed easily.  Psychiatric/Behavioral: Negative for depression. The patient is not nervous/anxious.     DRUG ALLERGIES:   Allergies  Allergen Reactions  . Ace Inhibitors Swelling and Anaphylaxis  . Ativan [Lorazepam] Other (See Comments)    Reaction:Hallucinations and headaches  . Compazine [Prochlorperazine Edisylate] Anaphylaxis, Nausea And Vomiting and Other (See Comments)    Other reaction(s): dystonia from this vs. Reglan, 23 Jul - patient relates that she takes promethazine frequently with no problems  . Sumatriptan Succinate Other (See Comments)    Other reaction(s): delirium and hallucinations per [facility] records  . Dilaudid [Hydromorphone Hcl] Other (See Comments)    Delirium   . Ondansetron Other (See Comments)    hallucinations    . Zofran [Ondansetron  Hcl] Other (See Comments)    Reaction:  hallucinations   . Codeine Nausea And Vomiting  . Gabapentin Other (See Comments)    Reaction:  Unknown    . [place]is Nausea And Vomiting  . Losartan Nausea Only  . Oxycodone Anxiety  . Prochlorperazine Other (See Comments)    Reaction:  Unknown . Patient does not remember reaction but she does have vertigo and anxiety along with n and v at times. Could be used to treat any of these   . Reglan [Metoclopramide] Other (See Comments)    Per patient her Dr. Evelina Bucy her off it   . Scopolamine Other (See Comments)    Dizziness, also has vertigo already  . Tape Rash  . Tapentadol Rash    VITALS:  Blood pressure (!) 142/57, pulse 61, temperature 98.3 F (36.8 C), temperature source Oral, resp. rate 16, height 5\' 5"  (1.651 m), weight 84.9 kg, SpO2 93 %.  PHYSICAL EXAMINATION:   Physical Exam  GENERAL:  58 y.o.-year-old patient lying in the bed. EYES: Pupils equal, round, reactive to light and accommodation. No scleral icterus. Extraocular muscles intact.  HEENT: Head atraumatic, normocephalic. Oropharynx and nasopharynx clear.  NECK:  Supple, no jugular venous distention. No thyroid enlargement, no tenderness.  LUNGS: Bilateral wheezing CARDIOVASCULAR: S1, S2 normal. No murmurs, rubs, or gallops.  ABDOMEN: Soft, nontender, nondistended. Bowel sounds present. No organomegaly or mass.  EXTREMITIES: No cyanosis, clubbing or edema b/l.    NEUROLOGIC: Cranial nerves II through XII are intact. No focal Motor or sensory deficits b/l.   PSYCHIATRIC: The patient is alert and oriented x 3.  SKIN: No obvious rash, lesion, or ulcer.   LABORATORY PANEL:  CBC Recent Labs  Lab 10/05/18 0641  WBC 5.8  HGB 9.5*  HCT 28.8*  PLT 198   ------------------------------------------------------------------------------------------------------------------ Chemistries  Recent Labs  Lab 10/04/18 1140 10/05/18 0641  NA 135 135  K 4.6 5.1  CL 98 102  CO2 23  21*  GLUCOSE 159* 97  BUN 38* 41*  CREATININE 9.04* 10.03*  CALCIUM 8.1* 7.8*  AST 25  --   ALT 15  --   ALKPHOS 112  --   BILITOT 0.9  --    ------------------------------------------------------------------------------------------------------------------  Cardiac Enzymes Recent Labs  Lab 10/05/18 0641  TROPONINI <0.03   ------------------------------------------------------------------------------------------------------------------  RADIOLOGY:  Ct Angio Head W Or Wo Contrast  Result Date: 10/04/2018 CLINICAL DATA:  Sudden onset of unresponsiveness beginning this morning. Negative head CT. EXAM: CT ANGIOGRAPHY HEAD AND NECK TECHNIQUE: Multidetector CT imaging of the head and neck was performed using the standard protocol during bolus administration of intravenous contrast. Multiplanar CT image reconstructions and MIPs were obtained to evaluate the vascular anatomy. Carotid stenosis measurements (when applicable) are obtained utilizing NASCET criteria, using the distal internal carotid diameter as the denominator. CONTRAST:  80mL OMNIPAQUE IOHEXOL 350 MG/ML SOLN COMPARISON:  Head CT earlier same day. MRI 04/22/2016. FINDINGS: CTA NECK FINDINGS Aortic arch: Mild aortic atherosclerosis. No aneurysm or dissection. Branching pattern of the brachiocephalic vessels is normal. 20% stenosis of the left subclavian artery origin. Right carotid system: Common carotid artery widely patent to the bifurcation. Soft and calcified plaque at the bifurcation and ICA bulb. Minimal diameter in the ICA bulb is 1.8 mm. Compared to a more distal cervical ICA diameter of 4.6 mm, this indicates a 60% stenosis. Left carotid system: Common carotid artery widely patent to the bifurcation. Soft and calcified plaque at the carotid bifurcation and ICA bulb. Minimal diameter of the ICA bulb is 2 mm. Compared to a more distal cervical ICA diameter of 4 mm, this indicates a 50% stenosis. Vertebral arteries: Both vertebral  artery origins are widely patent. Both vertebral arteries are widely patent through the cervical region to the foramen magnum. Skeleton: Normal Other neck: Normal Upper chest: Normal Review of the MIP images confirms the above findings CTA HEAD FINDINGS Anterior circulation: Both internal carotid arteries are patent through the skull base and siphon regions. There is atherosclerotic calcification throughout the carotid siphons but without stenosis greater than 50% suspected. Supraclinoid ICA stenosis likely approaches 70%. The anterior and middle cerebral vessels show flow. No large or medium vessel occlusion identified. Atherosclerotic narrowing of the left A1 segment. Posterior circulation: Both vertebral arteries are patent to the basilar. No basilar stenosis. Posterior circulation branch vessels are patent. Venous sinuses: Patent and normal. Anatomic variants: None Delayed phase: No abnormal enhancement. Review of the MIP images confirms the above findings IMPRESSION: 1. Atherosclerotic disease at both carotid bifurcations. 60% stenosis of the proximal ICA on the right. 50% stenosis of the proximal ICA on the left. 2. Atherosclerotic disease throughout the carotid siphons but without stenosis greater than 50%. More severe stenosis of the supraclinoid internal carotid arteries on each side, approximately 70%. 3. No intracranial large or medium vessel occlusion or correctable proximal stenosis. No significant posterior circulation pathology. Electronically Signed   By: Nelson Chimes M.D.   On: 10/04/2018 13:54   Ct Head Wo Contrast  Result Date: 10/04/2018 CLINICAL DATA:  Headache and altered level of consciousness. EXAM: CT HEAD WITHOUT CONTRAST TECHNIQUE: Contiguous axial images were obtained from the base of the skull through the vertex without intravenous  contrast. COMPARISON:  08/14/2017 FINDINGS: Brain: The brain shows a normal appearance without evidence of malformation, atrophy, old or acute small or  large vessel infarction, mass lesion, hemorrhage, hydrocephalus or extra-axial collection. Vascular: No hyperdense vessel. No evidence of atherosclerotic calcification. Skull: Normal. No traumatic finding. No focal bone lesion. Sinuses/Orbits: Sinuses are clear. Orbits appear normal. Mastoids are clear. Other: None significant IMPRESSION: Normal head CT. Electronically Signed   By: Nelson Chimes M.D.   On: 10/04/2018 11:27   Ct Angio Neck W And/or Wo Contrast  Result Date: 10/04/2018 CLINICAL DATA:  Sudden onset of unresponsiveness beginning this morning. Negative head CT. EXAM: CT ANGIOGRAPHY HEAD AND NECK TECHNIQUE: Multidetector CT imaging of the head and neck was performed using the standard protocol during bolus administration of intravenous contrast. Multiplanar CT image reconstructions and MIPs were obtained to evaluate the vascular anatomy. Carotid stenosis measurements (when applicable) are obtained utilizing NASCET criteria, using the distal internal carotid diameter as the denominator. CONTRAST:  64mL OMNIPAQUE IOHEXOL 350 MG/ML SOLN COMPARISON:  Head CT earlier same day. MRI 04/22/2016. FINDINGS: CTA NECK FINDINGS Aortic arch: Mild aortic atherosclerosis. No aneurysm or dissection. Branching pattern of the brachiocephalic vessels is normal. 20% stenosis of the left subclavian artery origin. Right carotid system: Common carotid artery widely patent to the bifurcation. Soft and calcified plaque at the bifurcation and ICA bulb. Minimal diameter in the ICA bulb is 1.8 mm. Compared to a more distal cervical ICA diameter of 4.6 mm, this indicates a 60% stenosis. Left carotid system: Common carotid artery widely patent to the bifurcation. Soft and calcified plaque at the carotid bifurcation and ICA bulb. Minimal diameter of the ICA bulb is 2 mm. Compared to a more distal cervical ICA diameter of 4 mm, this indicates a 50% stenosis. Vertebral arteries: Both vertebral artery origins are widely patent. Both  vertebral arteries are widely patent through the cervical region to the foramen magnum. Skeleton: Normal Other neck: Normal Upper chest: Normal Review of the MIP images confirms the above findings CTA HEAD FINDINGS Anterior circulation: Both internal carotid arteries are patent through the skull base and siphon regions. There is atherosclerotic calcification throughout the carotid siphons but without stenosis greater than 50% suspected. Supraclinoid ICA stenosis likely approaches 70%. The anterior and middle cerebral vessels show flow. No large or medium vessel occlusion identified. Atherosclerotic narrowing of the left A1 segment. Posterior circulation: Both vertebral arteries are patent to the basilar. No basilar stenosis. Posterior circulation branch vessels are patent. Venous sinuses: Patent and normal. Anatomic variants: None Delayed phase: No abnormal enhancement. Review of the MIP images confirms the above findings IMPRESSION: 1. Atherosclerotic disease at both carotid bifurcations. 60% stenosis of the proximal ICA on the right. 50% stenosis of the proximal ICA on the left. 2. Atherosclerotic disease throughout the carotid siphons but without stenosis greater than 50%. More severe stenosis of the supraclinoid internal carotid arteries on each side, approximately 70%. 3. No intracranial large or medium vessel occlusion or correctable proximal stenosis. No significant posterior circulation pathology. Electronically Signed   By: Nelson Chimes M.D.   On: 10/04/2018 13:54   Dg Chest Portable 1 View  Result Date: 10/04/2018 CLINICAL DATA:  Sudden onset of mental status change. EXAM: PORTABLE CHEST 1 VIEW COMPARISON:  06/17/2018 FINDINGS: Heart size is normal. There is a poor inspiration. There may be venous hypertension but there is no frank edema, infiltrate, collapse or effusion. Vascular stent present on both sides, presumably venous. IMPRESSION: Poor inspiration. Possible venous hypertension  without frank  edema. Electronically Signed   By: Nelson Chimes M.D.   On: 10/04/2018 12:09     ASSESSMENT AND PLAN:   * Syncope vs seizure Clinical encounter concerning for seizures.  Will consult neurology. No seizure medications at this time Telemetry monitoring.  Echocardiogram ordered.  Troponin normal.  *Carotid stenosis.  Could be playing a part in her symptoms.  Vascular surgery consulted.  *COPD exacerbation.  Will start steroids and scheduled nebulizers. Oxygen as needed.  *Dizziness.  Likely labyrinthitis from her sinus congestion and bronchitis. Add meclizine.  *End-stage renal disease on hemodialysis.  Hemodialysis later today  *Diabetes mellitus.  Sliding scale insulin.  All the records are reviewed and case discussed with Care Management/Social Worker Management plans discussed with the patient, family and they are in agreement.  CODE STATUS:FULL CODE  DVT Prophylaxis: SCDs  TOTAL TIME TAKING CARE OF THIS PATIENT: 35 minutes.   POSSIBLE D/C IN 1-2 DAYS, DEPENDING ON CLINICAL CONDITION.  Neita Carp M.D on 10/05/2018 at 2:18 PM  Between 7am to 6pm - Pager - [phone]  After 6pm go to [email or web address] - password [email or web address] [name]  [employer] [place] [facility]  Office  [phone]  CC: Primary care physician; Freddy Finner, NP  Note: This dictation was prepared with Dragon dictation along with smaller phrase technology. Any transcriptional errors that result from this process are unintentional.

## 2018-10-05 NOTE — Care Management Obs Status (Signed)
[place] NOTIFICATION   Patient Details  Name: [name] MRN: [id number] Date of Birth: 26-Jul-1961   Medicare Observation Status Notification Given:  Yes    Elza Rafter, RN 10/05/2018, 3:52 PM

## 2018-10-05 NOTE — Progress Notes (Signed)
Pt returns from HD, VSS, complaints of headache and feeling like her sugar is low, asking for creon and phenerghan etc CBG will be taken. I will address pt concerns. I will continue to assess.

## 2018-10-05 NOTE — Clinical Social Work Note (Signed)
CSW received consult for patient possibly needing SNF, however patient is under observation.  Patient will not be able to use her Medicare benefits, PT worked with patient and are recommending home with home health.  CSW to sign off please reconsult if patient needs social work needs.  Jones Broom. [place], MSW, [place]  10/05/2018 10:07 AM

## 2018-10-05 NOTE — Progress Notes (Signed)
Pre HD assessment    10/05/18 1418  Neurological  Level of Consciousness Alert  Orientation Level Oriented X4  Respiratory  Respiratory Pattern Regular;Unlabored  Chest Assessment Chest expansion symmetrical  Cough Productive  Cardiac  Pulse Regular  ECG Monitor Yes  Cardiac Rhythm NSR;SB  Vascular  R Radial Pulse +2  L Radial Pulse +2  Edema Generalized;Right upper extremity;Left upper extremity;Right lower extremity;Left lower extremity  Integumentary  Integumentary (WDL) X  Skin Color Appropriate for ethnicity  Musculoskeletal  Musculoskeletal (WDL) X  Generalized Weakness Yes  Assistive Device None  GU Assessment  Genitourinary (WDL) X  Genitourinary Symptoms  (HD)  Psychosocial  Psychosocial (WDL) WDL

## 2018-10-05 NOTE — Progress Notes (Signed)
HD tx start    10/05/18 1427  Vital Signs  Pulse Rate 63  Pulse Rate Source Monitor  Resp 13  BP (!) 170/68  BP Location Right Arm  BP Method Automatic  Patient Position (if appropriate) Lying  Oxygen Therapy  SpO2 98 %  O2 Device Room Air  During Hemodialysis Assessment  Blood Flow Rate (mL/min) 300 mL/min  Arterial Pressure (mmHg) -110 mmHg  Venous Pressure (mmHg) 130 mmHg  Transmembrane Pressure (mmHg) 50 mmHg  Ultrafiltration Rate (mL/min) 570 mL/min  Dialysate Flow Rate (mL/min) 600 ml/min  Conductivity: Machine  13.5  HD Safety Checks Performed Yes  Dialysis Fluid Bolus Normal Saline  Bolus Amount (mL) 250 mL  Intra-Hemodialysis Comments Tx initiated

## 2018-10-05 NOTE — Progress Notes (Signed)
[facility]  ROUNDING NOTE   Subjective:  Patient well-known to [name]. She came in with syncopal event yesterday. She did not undergo hemodialysis yesterday. Therefore we are planning for dialysis today.   Objective:  Vital signs in last 24 hours:  Temp:  [97.6 F (36.4 C)-98.4 F (36.9 C)] 98.3 F (36.8 C) (01/14 1417) Pulse Rate:  [54-70] 56 (01/14 1445) Resp:  [13-20] 15 (01/14 1445) BP: (129-176)/(48-70) 171/70 (01/14 1445) SpO2:  [93 %-100 %] 100 % (01/14 1445) Weight:  [84.5 kg-88.4 kg] 88.4 kg (01/14 1417)  Weight change:  Filed Weights   10/04/18 1847 10/05/18 0514 10/05/18 1417  Weight: 84.5 kg 84.9 kg 88.4 kg    Intake/Output: No intake/output data recorded.   Intake/Output this shift:  Total I/O In: 480 [P.O.:480] Out: -   Physical Exam: General: No acute distress  Head: Normocephalic, atraumatic. Moist oral mucosal membranes  Eyes: Anicteric  Neck: Supple, trachea midline  Lungs:  Clear to auscultation, normal effort  Heart: S1S2 no rubs  Abdomen:  Soft, nontender, bowel sounds present  Extremities: Trace peripheral edema.  Neurologic: Awake, alert, following commands  Skin: No lesions  Access: AV graft    Basic Metabolic Panel: Recent Labs  Lab 10/02/18 1151 10/04/18 1140 10/05/18 0641 10/05/18 1241  NA 134* 135 135  --   K 4.3 4.6 5.1  --   CL 97* 98 102  --   CO2 24 23 21*  --   GLUCOSE 221* 159* 97  --   BUN 21* 38* 41*  --   CREATININE 5.73* 9.04* 10.03*  --   CALCIUM 8.4* 8.1* 7.8*  --   PHOS  --   --   --  5.3*    Liver Function Tests: Recent Labs  Lab 10/02/18 1151 10/04/18 1140  AST 24 25  ALT 15 15  ALKPHOS 136* 112  BILITOT 0.8 0.9  PROT 8.3* 7.5  ALBUMIN 4.0 3.6   Recent Labs  Lab 10/02/18 1151 10/04/18 1140  LIPASE 45 83*   Recent Labs  Lab 10/04/18 1140  AMMONIA 17    CBC: Recent Labs  Lab 10/02/18 1151 10/04/18 1140 10/05/18 0641  WBC 5.3 4.8 5.8  NEUTROABS 3.6 2.6  --   HGB 10.1*  9.7* 9.5*  HCT 32.9* 30.7* 28.8*  MCV 94.5 93.6 94.7  PLT 189 209 198    Cardiac Enzymes: Recent Labs  Lab 10/04/18 1140 10/04/18 1906 10/05/18 0023 10/05/18 0641  TROPONINI <0.03 <0.03 <0.03 <0.03    BNP: Invalid input(s): POCBNP  CBG: Recent Labs  Lab 10/04/18 1731 10/04/18 1902 10/04/18 2107 10/05/18 0744 10/05/18 1155  GLUCAP 116* 100* 13* 29 169*    Microbiology: Results for orders placed or performed during the hospital encounter of 10/04/18  MRSA PCR Screening     Status: None   Collection Time: 10/04/18  7:10 PM  Result Value Ref Range Status   MRSA by PCR NEGATIVE NEGATIVE Final    Comment:        The GeneXpert MRSA Assay (FDA approved for NASAL specimens only), is one component of a comprehensive MRSA colonization surveillance program. It is not intended to diagnose MRSA infection nor to guide or monitor treatment for MRSA infections. Performed at [facility], [address] [place], [place] [address]     Coagulation Studies: No results for input(s): LABPROT, INR in the last 72 hours.  Urinalysis: No results for input(s): COLORURINE, LABSPEC, [place], [place], [place], [place], [place], [place], [place],  NITRITE, LEUKOCYTESUR in the last 72 hours.  Invalid input(s): APPERANCEUR    Imaging: Ct Angio Head W Or Wo Contrast  Result Date: 10/04/2018 CLINICAL DATA:  Sudden onset of unresponsiveness beginning this morning. Negative head CT. EXAM: CT ANGIOGRAPHY HEAD AND NECK TECHNIQUE: Multidetector CT imaging of the head and neck was performed using the standard protocol during bolus administration of intravenous contrast. Multiplanar CT image reconstructions and MIPs were obtained to evaluate the vascular anatomy. Carotid stenosis measurements (when applicable) are obtained utilizing NASCET criteria, using the distal internal carotid diameter as the denominator. CONTRAST:  60mL OMNIPAQUE IOHEXOL 350 MG/ML SOLN COMPARISON:   Head CT earlier same day. MRI 04/22/2016. FINDINGS: CTA NECK FINDINGS Aortic arch: Mild aortic atherosclerosis. No aneurysm or dissection. Branching pattern of the brachiocephalic vessels is normal. 20% stenosis of the left subclavian artery origin. Right carotid system: Common carotid artery widely patent to the bifurcation. Soft and calcified plaque at the bifurcation and ICA bulb. Minimal diameter in the ICA bulb is 1.8 mm. Compared to a more distal cervical ICA diameter of 4.6 mm, this indicates a 60% stenosis. Left carotid system: Common carotid artery widely patent to the bifurcation. Soft and calcified plaque at the carotid bifurcation and ICA bulb. Minimal diameter of the ICA bulb is 2 mm. Compared to a more distal cervical ICA diameter of 4 mm, this indicates a 50% stenosis. Vertebral arteries: Both vertebral artery origins are widely patent. Both vertebral arteries are widely patent through the cervical region to the foramen magnum. Skeleton: Normal Other neck: Normal Upper chest: Normal Review of the MIP images confirms the above findings CTA HEAD FINDINGS Anterior circulation: Both internal carotid arteries are patent through the skull base and siphon regions. There is atherosclerotic calcification throughout the carotid siphons but without stenosis greater than 50% suspected. Supraclinoid ICA stenosis likely approaches 70%. The anterior and middle cerebral vessels show flow. No large or medium vessel occlusion identified. Atherosclerotic narrowing of the left A1 segment. Posterior circulation: Both vertebral arteries are patent to the basilar. No basilar stenosis. Posterior circulation branch vessels are patent. Venous sinuses: Patent and normal. Anatomic variants: None Delayed phase: No abnormal enhancement. Review of the MIP images confirms the above findings IMPRESSION: 1. Atherosclerotic disease at both carotid bifurcations. 60% stenosis of the proximal ICA on the right. 50% stenosis of the proximal  ICA on the left. 2. Atherosclerotic disease throughout the carotid siphons but without stenosis greater than 50%. More severe stenosis of the supraclinoid internal carotid arteries on each side, approximately 70%. 3. No intracranial large or medium vessel occlusion or correctable proximal stenosis. No significant posterior circulation pathology. Electronically Signed   By: Nelson Chimes M.D.   On: 10/04/2018 13:54   Ct Head Wo Contrast  Result Date: 10/04/2018 CLINICAL DATA:  Headache and altered level of consciousness. EXAM: CT HEAD WITHOUT CONTRAST TECHNIQUE: Contiguous axial images were obtained from the base of the skull through the vertex without intravenous contrast. COMPARISON:  08/14/2017 FINDINGS: Brain: The brain shows a normal appearance without evidence of malformation, atrophy, old or acute small or large vessel infarction, mass lesion, hemorrhage, hydrocephalus or extra-axial collection. Vascular: No hyperdense vessel. No evidence of atherosclerotic calcification. Skull: Normal. No traumatic finding. No focal bone lesion. Sinuses/Orbits: Sinuses are clear. Orbits appear normal. Mastoids are clear. Other: None significant IMPRESSION: Normal head CT. Electronically Signed   By: Nelson Chimes M.D.   On: 10/04/2018 11:27   Ct Angio Neck W And/or Wo Contrast  Result Date: 10/04/2018  CLINICAL DATA:  Sudden onset of unresponsiveness beginning this morning. Negative head CT. EXAM: CT ANGIOGRAPHY HEAD AND NECK TECHNIQUE: Multidetector CT imaging of the head and neck was performed using the standard protocol during bolus administration of intravenous contrast. Multiplanar CT image reconstructions and MIPs were obtained to evaluate the vascular anatomy. Carotid stenosis measurements (when applicable) are obtained utilizing NASCET criteria, using the distal internal carotid diameter as the denominator. CONTRAST:  56mL OMNIPAQUE IOHEXOL 350 MG/ML SOLN COMPARISON:  Head CT earlier same day. MRI 04/22/2016.  FINDINGS: CTA NECK FINDINGS Aortic arch: Mild aortic atherosclerosis. No aneurysm or dissection. Branching pattern of the brachiocephalic vessels is normal. 20% stenosis of the left subclavian artery origin. Right carotid system: Common carotid artery widely patent to the bifurcation. Soft and calcified plaque at the bifurcation and ICA bulb. Minimal diameter in the ICA bulb is 1.8 mm. Compared to a more distal cervical ICA diameter of 4.6 mm, this indicates a 60% stenosis. Left carotid system: Common carotid artery widely patent to the bifurcation. Soft and calcified plaque at the carotid bifurcation and ICA bulb. Minimal diameter of the ICA bulb is 2 mm. Compared to a more distal cervical ICA diameter of 4 mm, this indicates a 50% stenosis. Vertebral arteries: Both vertebral artery origins are widely patent. Both vertebral arteries are widely patent through the cervical region to the foramen magnum. Skeleton: Normal Other neck: Normal Upper chest: Normal Review of the MIP images confirms the above findings CTA HEAD FINDINGS Anterior circulation: Both internal carotid arteries are patent through the skull base and siphon regions. There is atherosclerotic calcification throughout the carotid siphons but without stenosis greater than 50% suspected. Supraclinoid ICA stenosis likely approaches 70%. The anterior and middle cerebral vessels show flow. No large or medium vessel occlusion identified. Atherosclerotic narrowing of the left A1 segment. Posterior circulation: Both vertebral arteries are patent to the basilar. No basilar stenosis. Posterior circulation branch vessels are patent. Venous sinuses: Patent and normal. Anatomic variants: None Delayed phase: No abnormal enhancement. Review of the MIP images confirms the above findings IMPRESSION: 1. Atherosclerotic disease at both carotid bifurcations. 60% stenosis of the proximal ICA on the right. 50% stenosis of the proximal ICA on the left. 2. Atherosclerotic  disease throughout the carotid siphons but without stenosis greater than 50%. More severe stenosis of the supraclinoid internal carotid arteries on each side, approximately 70%. 3. No intracranial large or medium vessel occlusion or correctable proximal stenosis. No significant posterior circulation pathology. Electronically Signed   By: Nelson Chimes M.D.   On: 10/04/2018 13:54   Dg Chest Portable 1 View  Result Date: 10/04/2018 CLINICAL DATA:  Sudden onset of mental status change. EXAM: PORTABLE CHEST 1 VIEW COMPARISON:  06/17/2018 FINDINGS: Heart size is normal. There is a poor inspiration. There may be venous hypertension but there is no frank edema, infiltrate, collapse or effusion. Vascular stent present on both sides, presumably venous. IMPRESSION: Poor inspiration. Possible venous hypertension without frank edema. Electronically Signed   By: Nelson Chimes M.D.   On: 10/04/2018 12:09     Medications:   . sodium chloride    . sodium chloride    . sodium chloride     . amLODipine  10 mg Oral Daily  . aspirin  81 mg Oral Daily  . atorvastatin  20 mg Oral QPM  . Chlorhexidine Gluconate Cloth  6 each Topical Q0600  . cholecalciferol  400 Units Oral Daily  . famotidine  20 mg Oral Daily  .  ferrous sulfate  325 mg Oral Q breakfast  . FLUoxetine  20 mg Oral QHS  . heparin  5,000 Units Subcutaneous Q8H  . insulin aspart  0-9 Units Subcutaneous TID WC  . lipase/protease/amylase  24,000 Units Oral Q lunch  . lipase/protease/amylase  36,000 Units Oral BID AC  . loratadine  10 mg Oral Daily  . meclizine  25 mg Oral BID  . multivitamin  1 tablet Oral Daily  . sevelamer carbonate  2,400 mg Oral TID WC  . sevelamer carbonate  800 mg Oral BID BM  . sodium chloride flush  3 mL Intravenous Q12H  . cyanocobalamin  1,000 mcg Oral Daily   sodium chloride, sodium chloride, sodium chloride, acetaminophen **OR** acetaminophen, albuterol, alteplase, heparin, HYDROcodone-acetaminophen, lidocaine (PF),  lidocaine-prilocaine, nitroGLYCERIN, ondansetron **OR** ondansetron (ZOFRAN) IV, pentafluoroprop-tetrafluoroeth, promethazine, sodium chloride flush  Assessment/ Plan:  58 y.o. female with diabetes, hypertension, hyperlipidemia, diabetic retinopathy, diabetic neuropathy, diabetic gastroparesis, pancreatic insufficiency, depression, GERD. Renal masses, M-spike   MWF/ [facility] Payor, left arm AVG  1.  ESRD on HD MWF.  Dialysis was held yesterday she was a bit hypotensive in the emergency department.  Patient due for dialysis today.  Orders have been prepared.  We will plan for dialysis again tomorrow as well.  2.  Anemia chronic kidney disease.  Hemoglobin currently 9.5.  She has history of renal masses however we previously discussed with the patient to use of Epogen in this setting.  Patient was agreeable to use of Epogen.  3.  Secondary hyperparathyroidism.  Check phosphorus today and maintain the patient on Renvela otherwise.  4.  Renal masses.  Followed by Dr. Erlene Quan.  CT scan was performed on 10/02/18.   LOS: 0 Majesty Stehlin 1/14/20202:56 PM

## 2018-10-05 NOTE — Progress Notes (Signed)
Pre HD Assessment     10/05/18 1417  Vital Signs  Temp 98.3 F (36.8 C)  Temp Source Oral  Pulse Rate (!) 58  Pulse Rate Source Monitor  Resp 15  BP (!) 165/64  BP Location Right Arm  BP Method Automatic  Patient Position (if appropriate) Lying  Oxygen Therapy  SpO2 98 %  O2 Device Room Air  Pain Assessment  Pain Scale 0-10  Pain Score 0  Dialysis Weight  Weight 88.4 kg  Type of Weight Pre-Dialysis  Time-Out for Hemodialysis  What Procedure? HD  Pt Identifiers(min of two) First/Last Name;MRN/Account#  Correct Site? Yes  Correct Side? Yes  Correct Procedure? Yes  Consents Verified? Yes  Rad Studies Available? N/A  Safety Precautions Reviewed? Yes  Engineer, civil (consulting) Number  (4A)  Station Number 2  UF/Alarm Test Passed  Conductivity: Meter 13.8  Conductivity: Machine  13.9  pH 7.4  Reverse Osmosis main  Normal Saline Lot Number [id number]  Dialyzer Lot Number [id number]  Disposable Set Lot Number [id number]  Machine Temperature 98.6 F (37 C)  Musician and Audible Yes  Blood Lines Intact and Secured Yes  Pre Treatment Patient Checks  Vascular access used during treatment Graft  Hepatitis B Surface Antigen Results Negative  Date Hepatitis B Surface Antigen Drawn 03/09/18  Hepatitis B Surface Antibody  (>10)  Date Hepatitis B Surface Antibody Drawn 03/10/19  Hemodialysis Consent Verified Yes  Hemodialysis Standing Orders Initiated Yes  ECG (Telemetry) Monitor On Yes  Prime Ordered Normal Saline  Length of  DialysisTreatment -hour(s) 3.5 Hour(s)  Dialyzer Elisio 17H NR  Dialysate 2K, 2.5 Ca  Dialysis Anticoagulant None  Dialysate Flow Ordered 600  Blood Flow Rate Ordered 400 mL/min  Ultrafiltration Goal 1.5 Liters  Pre Treatment Labs Phosphorus (PTH)  Dialysis Blood Pressure Support Ordered Normal Saline  Education / Care Plan  Dialysis Education Provided Yes  Documented Education in Care Plan Yes

## 2018-10-05 NOTE — Progress Notes (Signed)
Post HD assessment    10/05/18 1813  Neurological  Level of Consciousness Alert  Orientation Level Oriented X4  Respiratory  Respiratory Pattern Regular;Unlabored  Chest Assessment Chest expansion symmetrical  Cough Productive  Cardiac  Pulse Regular  ECG Monitor Yes  Cardiac Rhythm NSR;SB  Vascular  R Radial Pulse +2  L Radial Pulse +2  Edema Generalized;Right upper extremity;Left upper extremity;Right lower extremity;Left lower extremity  Integumentary  Integumentary (WDL) X  Skin Color Appropriate for ethnicity  Musculoskeletal  Musculoskeletal (WDL) X  Generalized Weakness Yes  Assistive Device None  GU Assessment  Genitourinary (WDL) X  Genitourinary Symptoms  (HD)  Psychosocial  Psychosocial (WDL) WDL

## 2018-10-06 ENCOUNTER — Observation Stay
Admit: 2018-10-06 | Discharge: 2018-10-06 | Disposition: A | Discharge status: Home / Self Care | Payer: Medicare Other | Attending: Internal Medicine | Admitting: Internal Medicine

## 2018-10-06 ENCOUNTER — Observation Stay: Payer: Medicare Other

## 2018-10-06 DIAGNOSIS — I5032 Chronic diastolic (congestive) heart failure: Secondary | ICD-10-CM | POA: Diagnosis present

## 2018-10-06 DIAGNOSIS — N186 End stage renal disease: Secondary | ICD-10-CM | POA: Diagnosis present

## 2018-10-06 DIAGNOSIS — K219 Gastro-esophageal reflux disease without esophagitis: Secondary | ICD-10-CM | POA: Diagnosis present

## 2018-10-06 DIAGNOSIS — E1122 Type 2 diabetes mellitus with diabetic chronic kidney disease: Secondary | ICD-10-CM | POA: Diagnosis present

## 2018-10-06 DIAGNOSIS — R55 Syncope and collapse: Secondary | ICD-10-CM | POA: Diagnosis not present

## 2018-10-06 DIAGNOSIS — N2581 Secondary hyperparathyroidism of renal origin: Secondary | ICD-10-CM | POA: Diagnosis present

## 2018-10-06 DIAGNOSIS — R51 Headache: Secondary | ICD-10-CM | POA: Diagnosis not present

## 2018-10-06 DIAGNOSIS — K3184 Gastroparesis: Secondary | ICD-10-CM | POA: Diagnosis present

## 2018-10-06 DIAGNOSIS — M316 Other giant cell arteritis: Secondary | ICD-10-CM | POA: Diagnosis present

## 2018-10-06 DIAGNOSIS — J449 Chronic obstructive pulmonary disease, unspecified: Secondary | ICD-10-CM | POA: Diagnosis present

## 2018-10-06 DIAGNOSIS — H9190 Unspecified hearing loss, unspecified ear: Secondary | ICD-10-CM | POA: Diagnosis present

## 2018-10-06 DIAGNOSIS — F329 Major depressive disorder, single episode, unspecified: Secondary | ICD-10-CM | POA: Diagnosis present

## 2018-10-06 DIAGNOSIS — I340 Nonrheumatic mitral (valve) insufficiency: Secondary | ICD-10-CM | POA: Diagnosis present

## 2018-10-06 DIAGNOSIS — K449 Diaphragmatic hernia without obstruction or gangrene: Secondary | ICD-10-CM | POA: Diagnosis present

## 2018-10-06 DIAGNOSIS — I2510 Atherosclerotic heart disease of native coronary artery without angina pectoris: Secondary | ICD-10-CM | POA: Diagnosis present

## 2018-10-06 DIAGNOSIS — R404 Transient alteration of awareness: Secondary | ICD-10-CM | POA: Diagnosis present

## 2018-10-06 DIAGNOSIS — E1165 Type 2 diabetes mellitus with hyperglycemia: Secondary | ICD-10-CM | POA: Diagnosis present

## 2018-10-06 DIAGNOSIS — F411 Generalized anxiety disorder: Secondary | ICD-10-CM | POA: Diagnosis present

## 2018-10-06 DIAGNOSIS — M1990 Unspecified osteoarthritis, unspecified site: Secondary | ICD-10-CM | POA: Diagnosis present

## 2018-10-06 DIAGNOSIS — N2889 Other specified disorders of kidney and ureter: Secondary | ICD-10-CM | POA: Diagnosis present

## 2018-10-06 DIAGNOSIS — E782 Mixed hyperlipidemia: Secondary | ICD-10-CM | POA: Diagnosis present

## 2018-10-06 DIAGNOSIS — J441 Chronic obstructive pulmonary disease with (acute) exacerbation: Secondary | ICD-10-CM | POA: Diagnosis present

## 2018-10-06 DIAGNOSIS — I132 Hypertensive heart and chronic kidney disease with heart failure and with stage 5 chronic kidney disease, or end stage renal disease: Secondary | ICD-10-CM | POA: Diagnosis present

## 2018-10-06 DIAGNOSIS — D631 Anemia in chronic kidney disease: Secondary | ICD-10-CM | POA: Diagnosis present

## 2018-10-06 DIAGNOSIS — E1143 Type 2 diabetes mellitus with diabetic autonomic (poly)neuropathy: Secondary | ICD-10-CM | POA: Diagnosis present

## 2018-10-06 DIAGNOSIS — E1151 Type 2 diabetes mellitus with diabetic peripheral angiopathy without gangrene: Secondary | ICD-10-CM | POA: Diagnosis present

## 2018-10-06 DIAGNOSIS — J9601 Acute respiratory failure with hypoxia: Secondary | ICD-10-CM | POA: Diagnosis present

## 2018-10-06 DIAGNOSIS — E11319 Type 2 diabetes mellitus with unspecified diabetic retinopathy without macular edema: Secondary | ICD-10-CM | POA: Diagnosis present

## 2018-10-06 LAB — GLUCOSE, CAPILLARY
Glucose-Capillary: 145 mg/dL — ABNORMAL HIGH (ref 70–99)
Glucose-Capillary: 160 mg/dL — ABNORMAL HIGH (ref 70–99)
Glucose-Capillary: 166 mg/dL — ABNORMAL HIGH (ref 70–99)
Glucose-Capillary: 179 mg/dL — ABNORMAL HIGH (ref 70–99)
Glucose-Capillary: 69 mg/dL — ABNORMAL LOW (ref 70–99)
Glucose-Capillary: 92 mg/dL (ref 70–99)

## 2018-10-06 LAB — PHOSPHORUS: Phosphorus: 4.5 mg/dL (ref 2.5–4.6)

## 2018-10-06 LAB — PARATHYROID HORMONE, INTACT (NO CA): PTH: 357 pg/mL — ABNORMAL HIGH (ref 15–65)

## 2018-10-06 LAB — HEPATITIS B SURFACE ANTIGEN: Hepatitis B Surface Ag: NEGATIVE

## 2018-10-06 LAB — HEPATITIS B SURFACE ANTIBODY, QUANTITATIVE: Hepatitis B-Post: >1000 m[IU]/mL (ref >9.9)

## 2018-10-06 MED ORDER — IPRATROPIUM-ALBUTEROL 0.5-2.5 (3) MG/3ML IN SOLN
3.0000 mL | Freq: Four times a day (QID) | RESPIRATORY_TRACT | Status: DC
  Administered 2018-10-06 – 2018-10-07 (×3): 3 mL via RESPIRATORY_TRACT
  Filled 2018-10-06 (×3): qty 3

## 2018-10-06 MED ORDER — ALUM & MAG HYDROXIDE-SIMETH 200-200-20 MG/5ML PO SUSP
30.0000 mL | ORAL | Status: DC | PRN
  Administered 2018-10-06: 30 mL via ORAL
  Filled 2018-10-06: qty 30

## 2018-10-06 MED ORDER — METHYLPREDNISOLONE SODIUM SUCC 125 MG IJ SOLR
80.0000 mg | Freq: Every day | INTRAMUSCULAR | Status: DC
  Administered 2018-10-06 – 2018-10-11 (×6): 80 mg via INTRAVENOUS
  Filled 2018-10-06 (×6): qty 2

## 2018-10-06 MED ORDER — CEFAZOLIN SODIUM-DEXTROSE 2-4 GM/100ML-% IV SOLN
2.0000 g | INTRAVENOUS | Status: AC
  Filled 2018-10-06: qty 100

## 2018-10-06 MED ORDER — EPOETIN ALFA 10000 UNIT/ML IJ SOLN
10000.0000 [IU] | INTRAMUSCULAR | Status: DC
  Administered 2018-10-06 – 2018-10-11 (×3): 10000 [IU] via INTRAVENOUS

## 2018-10-06 NOTE — Progress Notes (Deleted)
Pt experiencing increased bleeding at both HD access sites, venous and arterial,MD aware.

## 2018-10-06 NOTE — Progress Notes (Signed)
eeg completed ° °

## 2018-10-06 NOTE — Plan of Care (Signed)
  Problem: Fluid Volume: Goal: Fluid volume balance will be maintained or improved Outcome: Not Progressing  Non compliant with fluid restriction

## 2018-10-06 NOTE — Progress Notes (Signed)
Dialysate bath changed r/t a potassium of 5.1, MD aware.    10/06/18 1015  Vital Signs  Pulse Rate 65  Pulse Rate Source Monitor  Resp 15  BP (!) 153/65  BP Location Right Arm  BP Method Automatic  Patient Position (if appropriate) Lying  Oxygen Therapy  SpO2 100 %  O2 Device Nasal Cannula  O2 Flow Rate (L/min) 2 L/min  Pre Treatment Patient Checks  Dialysate 2K, 2.5 Ca (changed r/t a potassium of 5.1, MD aware)  During Hemodialysis Assessment  Blood Flow Rate (mL/min) 400 mL/min  Arterial Pressure (mmHg) -150 mmHg  Venous Pressure (mmHg) 230 mmHg  Transmembrane Pressure (mmHg) 60 mmHg  Ultrafiltration Rate (mL/min) 430 mL/min  Dialysate Flow Rate (mL/min) 800 ml/min  Conductivity: Machine  13.6  HD Safety Checks Performed Yes  Intra-Hemodialysis Comments Progressing as prescribed (330)

## 2018-10-06 NOTE — Progress Notes (Signed)
Pre HD assessment    10/06/18 0924  Neurological  Level of Consciousness Alert  Orientation Level Oriented X4  Respiratory  Respiratory Pattern Regular;Unlabored  Chest Assessment Chest expansion symmetrical  Cardiac  ECG Monitor Yes  Cardiac Rhythm NSR  Vascular  R Radial Pulse +2  L Radial Pulse +2  Edema Generalized;Right upper extremity;Left upper extremity;Right lower extremity;Left lower extremity  Integumentary  Integumentary (WDL) X  Skin Color Appropriate for ethnicity  Musculoskeletal  Musculoskeletal (WDL) X  Generalized Weakness Yes  Assistive Device None  GU Assessment  Genitourinary (WDL) X  Genitourinary Symptoms  (HD)  Psychosocial  Psychosocial (WDL) WDL

## 2018-10-06 NOTE — Progress Notes (Signed)
Pt experiencing increased bleeding at both HD access sites, venous and arterial,MD aware.    10/06/18 1336  Hand-Off documentation  Report received from (Full Name) Stark Bray  Post-Hemodialysis Assessment  AVG/AVF Arterial Site Held (minutes) 15 minutes  AVG/AVF Venous Site Held (minutes) 15 minutes

## 2018-10-06 NOTE — Progress Notes (Signed)
[facility]  ROUNDING NOTE   Subjective:  Patient seen and evaluated during hemodialysis. Tolerating well.    Objective:  Vital signs in last 24 hours:  Temp:  [98 F (36.7 C)-98.5 F (36.9 C)] 98.5 F (36.9 C) (01/15 0923) Pulse Rate:  [56-75] 64 (01/15 1200) Resp:  [10-23] 10 (01/15 1200) BP: (133-187)/(54-80) 165/66 (01/15 1200) SpO2:  [86 %-100 %] 100 % (01/15 1200) Weight:  [82.6 kg-88.4 kg] 87.3 kg (01/15 0923)  Weight change: 3.4 kg Filed Weights   10/05/18 1814 10/06/18 0510 10/06/18 0923  Weight: 86.9 kg 82.6 kg 87.3 kg    Intake/Output: I/O last 3 completed shifts: In: 480 [P.O.:480] Out: 1507 [[id number]]   Intake/Output this shift:  No intake/output data recorded.  Physical Exam: General: No acute distress  Head: Normocephalic, atraumatic. Moist oral mucosal membranes  Eyes: Anicteric  Neck: Supple, trachea midline  Lungs:  Clear to auscultation, normal effort  Heart: S1S2 no rubs  Abdomen:  Soft, nontender, bowel sounds present  Extremities: Trace peripheral edema.  Neurologic: Awake, alert, following commands  Skin: No lesions  Access: AV graft    Basic Metabolic Panel: Recent Labs  Lab 10/02/18 1151 10/04/18 1140 10/05/18 0641 10/05/18 1241 10/06/18 0851  NA 134* 135 135  --   --   K 4.3 4.6 5.1  --   --   CL 97* 98 102  --   --   CO2 24 23 21*  --   --   GLUCOSE 221* 159* 97  --   --   BUN 21* 38* 41*  --   --   CREATININE 5.73* 9.04* 10.03*  --   --   CALCIUM 8.4* 8.1* 7.8*  --   --   PHOS  --   --   --  5.3* 4.5    Liver Function Tests: Recent Labs  Lab 10/02/18 1151 10/04/18 1140  AST 24 25  ALT 15 15  ALKPHOS 136* 112  BILITOT 0.8 0.9  PROT 8.3* 7.5  ALBUMIN 4.0 3.6   Recent Labs  Lab 10/02/18 1151 10/04/18 1140  LIPASE 45 83*   Recent Labs  Lab 10/04/18 1140  AMMONIA 17    CBC: Recent Labs  Lab 10/02/18 1151 10/04/18 1140 10/05/18 0641  WBC 5.3 4.8 5.8  NEUTROABS 3.6 2.6  --   HGB  10.1* 9.7* 9.5*  HCT 32.9* 30.7* 28.8*  MCV 94.5 93.6 94.7  PLT 189 209 198    Cardiac Enzymes: Recent Labs  Lab 10/04/18 1140 10/04/18 1906 10/05/18 0023 10/05/18 0641  TROPONINI <0.03 <0.03 <0.03 <0.03    BNP: Invalid input(s): POCBNP  CBG: Recent Labs  Lab 10/05/18 1921 10/05/18 1948 10/05/18 2139 10/06/18 0005 10/06/18 0747  GLUCAP 37* 75 169* 145* 83*    Microbiology: Results for orders placed or performed during the hospital encounter of 10/04/18  MRSA PCR Screening     Status: None   Collection Time: 10/04/18  7:10 PM  Result Value Ref Range Status   MRSA by PCR NEGATIVE NEGATIVE Final    Comment:        The GeneXpert MRSA Assay (FDA approved for NASAL specimens only), is one component of a comprehensive MRSA colonization surveillance program. It is not intended to diagnose MRSA infection nor to guide or monitor treatment for MRSA infections. Performed at [facility], [address] [place], [place] [address]     Coagulation Studies: No results for input(s): LABPROT, INR in the last 72  hours.  Urinalysis: No results for input(s): COLORURINE, LABSPEC, PHURINE, GLUCOSEU, HGBUR, BILIRUBINUR, KETONESUR, PROTEINUR, UROBILINOGEN, NITRITE, LEUKOCYTESUR in the last 72 hours.  Invalid input(s): APPERANCEUR    Imaging: Ct Angio Head W Or Wo Contrast  Result Date: 10/04/2018 CLINICAL DATA:  Sudden onset of unresponsiveness beginning this morning. Negative head CT. EXAM: CT ANGIOGRAPHY HEAD AND NECK TECHNIQUE: Multidetector CT imaging of the head and neck was performed using the standard protocol during bolus administration of intravenous contrast. Multiplanar CT image reconstructions and MIPs were obtained to evaluate the vascular anatomy. Carotid stenosis measurements (when applicable) are obtained utilizing NASCET criteria, using the distal internal carotid diameter as the denominator. CONTRAST:  30mL OMNIPAQUE IOHEXOL 350 MG/ML SOLN  COMPARISON:  Head CT earlier same day. MRI 04/22/2016. FINDINGS: CTA NECK FINDINGS Aortic arch: Mild aortic atherosclerosis. No aneurysm or dissection. Branching pattern of the brachiocephalic vessels is normal. 20% stenosis of the left subclavian artery origin. Right carotid system: Common carotid artery widely patent to the bifurcation. Soft and calcified plaque at the bifurcation and ICA bulb. Minimal diameter in the ICA bulb is 1.8 mm. Compared to a more distal cervical ICA diameter of 4.6 mm, this indicates a 60% stenosis. Left carotid system: Common carotid artery widely patent to the bifurcation. Soft and calcified plaque at the carotid bifurcation and ICA bulb. Minimal diameter of the ICA bulb is 2 mm. Compared to a more distal cervical ICA diameter of 4 mm, this indicates a 50% stenosis. Vertebral arteries: Both vertebral artery origins are widely patent. Both vertebral arteries are widely patent through the cervical region to the foramen magnum. Skeleton: Normal Other neck: Normal Upper chest: Normal Review of the MIP images confirms the above findings CTA HEAD FINDINGS Anterior circulation: Both internal carotid arteries are patent through the skull base and siphon regions. There is atherosclerotic calcification throughout the carotid siphons but without stenosis greater than 50% suspected. Supraclinoid ICA stenosis likely approaches 70%. The anterior and middle cerebral vessels show flow. No large or medium vessel occlusion identified. Atherosclerotic narrowing of the left A1 segment. Posterior circulation: Both vertebral arteries are patent to the basilar. No basilar stenosis. Posterior circulation branch vessels are patent. Venous sinuses: Patent and normal. Anatomic variants: None Delayed phase: No abnormal enhancement. Review of the MIP images confirms the above findings IMPRESSION: 1. Atherosclerotic disease at both carotid bifurcations. 60% stenosis of the proximal ICA on the right. 50% stenosis of  the proximal ICA on the left. 2. Atherosclerotic disease throughout the carotid siphons but without stenosis greater than 50%. More severe stenosis of the supraclinoid internal carotid arteries on each side, approximately 70%. 3. No intracranial large or medium vessel occlusion or correctable proximal stenosis. No significant posterior circulation pathology. Electronically Signed   By: Nelson Chimes M.D.   On: 10/04/2018 13:54   Ct Angio Neck W And/or Wo Contrast  Result Date: 10/04/2018 CLINICAL DATA:  Sudden onset of unresponsiveness beginning this morning. Negative head CT. EXAM: CT ANGIOGRAPHY HEAD AND NECK TECHNIQUE: Multidetector CT imaging of the head and neck was performed using the standard protocol during bolus administration of intravenous contrast. Multiplanar CT image reconstructions and MIPs were obtained to evaluate the vascular anatomy. Carotid stenosis measurements (when applicable) are obtained utilizing NASCET criteria, using the distal internal carotid diameter as the denominator. CONTRAST:  108mL OMNIPAQUE IOHEXOL 350 MG/ML SOLN COMPARISON:  Head CT earlier same day. MRI 04/22/2016. FINDINGS: CTA NECK FINDINGS Aortic arch: Mild aortic atherosclerosis. No aneurysm or dissection. Branching pattern of the  brachiocephalic vessels is normal. 20% stenosis of the left subclavian artery origin. Right carotid system: Common carotid artery widely patent to the bifurcation. Soft and calcified plaque at the bifurcation and ICA bulb. Minimal diameter in the ICA bulb is 1.8 mm. Compared to a more distal cervical ICA diameter of 4.6 mm, this indicates a 60% stenosis. Left carotid system: Common carotid artery widely patent to the bifurcation. Soft and calcified plaque at the carotid bifurcation and ICA bulb. Minimal diameter of the ICA bulb is 2 mm. Compared to a more distal cervical ICA diameter of 4 mm, this indicates a 50% stenosis. Vertebral arteries: Both vertebral artery origins are widely patent. Both  vertebral arteries are widely patent through the cervical region to the foramen magnum. Skeleton: Normal Other neck: Normal Upper chest: Normal Review of the MIP images confirms the above findings CTA HEAD FINDINGS Anterior circulation: Both internal carotid arteries are patent through the skull base and siphon regions. There is atherosclerotic calcification throughout the carotid siphons but without stenosis greater than 50% suspected. Supraclinoid ICA stenosis likely approaches 70%. The anterior and middle cerebral vessels show flow. No large or medium vessel occlusion identified. Atherosclerotic narrowing of the left A1 segment. Posterior circulation: Both vertebral arteries are patent to the basilar. No basilar stenosis. Posterior circulation branch vessels are patent. Venous sinuses: Patent and normal. Anatomic variants: None Delayed phase: No abnormal enhancement. Review of the MIP images confirms the above findings IMPRESSION: 1. Atherosclerotic disease at both carotid bifurcations. 60% stenosis of the proximal ICA on the right. 50% stenosis of the proximal ICA on the left. 2. Atherosclerotic disease throughout the carotid siphons but without stenosis greater than 50%. More severe stenosis of the supraclinoid internal carotid arteries on each side, approximately 70%. 3. No intracranial large or medium vessel occlusion or correctable proximal stenosis. No significant posterior circulation pathology. Electronically Signed   By: Nelson Chimes M.D.   On: 10/04/2018 13:54     Medications:   . sodium chloride     . amLODipine  10 mg Oral Daily  . aspirin  81 mg Oral Daily  . atorvastatin  20 mg Oral QPM  . Chlorhexidine Gluconate Cloth  6 each Topical Q0600  . cholecalciferol  400 Units Oral Daily  . famotidine  20 mg Oral Daily  . ferrous sulfate  325 mg Oral Q breakfast  . FLUoxetine  20 mg Oral QHS  . heparin  5,000 Units Subcutaneous Q8H  . insulin aspart  0-9 Units Subcutaneous TID WC  .  ipratropium-albuterol  3 mL Nebulization Q6H  . lipase/protease/amylase  24,000 Units Oral Q lunch  . lipase/protease/amylase  36,000 Units Oral BID AC  . loratadine  10 mg Oral Daily  . meclizine  25 mg Oral BID  . methylPREDNISolone (SOLU-MEDROL) injection  60 mg Intravenous Q24H  . multivitamin  1 tablet Oral Daily  . sevelamer carbonate  2,400 mg Oral TID WC  . sevelamer carbonate  800 mg Oral BID BM  . sodium chloride flush  3 mL Intravenous Q12H  . cyanocobalamin  1,000 mcg Oral Daily   sodium chloride, acetaminophen **OR** acetaminophen, albuterol, alum & mag hydroxide-simeth, HYDROcodone-acetaminophen, nitroGLYCERIN, ondansetron **OR** ondansetron (ZOFRAN) IV, promethazine, sodium chloride flush  Assessment/ Plan:  58 y.o. female with diabetes, hypertension, hyperlipidemia, diabetic retinopathy, diabetic neuropathy, diabetic gastroparesis, pancreatic insufficiency, depression, GERD. Renal masses, M-spike   MWF/ [facility], left arm AVG  1.  ESRD on HD MWF.  Patient seen and evaluated during hemodialysis  today.  Tolerating well.  We plan to complete dialysis treatment today.  Next dialysis treatment on Friday if still here.  2.  Anemia chronic kidney disease.  Hemoglobin currently 9.5.  She has history of renal masses however we previously discussed with the patient to use of Epogen in this setting.  Patient was agreeable to use of Epogen.  Will start epogen 4000 IV with HD today.   3.  Secondary hyperparathyroidism.  Phos at target at 4.0, continue renvela.   4.  Renal masses.  Followed by Dr. Erlene Quan.  CT scan was performed on 10/02/18.   LOS: 0 Shaqueta Casady 1/15/202012:25 PM

## 2018-10-06 NOTE — Consult Note (Addendum)
Reason for Consult: Syncope and collapse Referring Physician: [name] MD  CC: Syncope and collapse  HPI: [name] is an 58 y.o. female with multiple medical issues including ESRD on dialysis M-W-F, diabetes mellitus, COPD, CAD status post stent, diabetic neuropathy, diverticulitis, dizziness, chronic headaches, pancreatitis, hypertension, depression, hyperlipidemia, and CHF presenting to the ED on 10/04/2018 with syncopal episode.  Per ED reports patient was talking with son around 36 yesterday when she suddenly became unresponsive and collapsed.  When EMS arrived patient began yelling and flailing arms around, she appeared altered and was complaining of a headache.  Patient state she has been having right temporal headache for a while now  With worsening symptoms starting on Monday. Reports associated symptoms of right shoulder pain. Patient denied associated symptoms preceding the syncope of aura, nausea and vomiting, feeling cold or clammy, visual auras or blurry vision, palpitations shortness of breath chest pain. She also denies loss of bladder control or tongue biting. Denies associated altered sensorium, speech abnormality, cranial nerve deficit, seizures, focal motor or sensory deficits, diplopia, or vomiting, ipsilateral or contralateral paralysis/weakness, numbness or tingling, involuntary movements, tremor. No reports of  recent head injury or trauma, infection or starting new medication.  She was recently seen in the ED a few days ago for abdominal pain and diarrhea.  Work-up at that time with CT of the abdomen and pelvis was negative.  Recent CT head done on 10/04/2018 was negative for acute intracranial abnormality.  Chest x-ray showed poor inspiration with possible venous hypertension without frank edema.  CTA head and neck showed atherosclerotic disease with no intracranial large vessel occlusion or significant stenosis.  Initial labs on admission revealed BNP of 348, ammonia 17,  normal white count with decreased H&H, lactic acid levels 0.8, creatinine 9.04, calcium 8.1, decreased kidney functions from baseline, negative troponins, ESR 83, TSH 1.38.  patient was admitted for further work-up, evaluation and management.    Past Medical History:  Diagnosis Date  . Anemia   . Anginal pain ([place])   . Anxiety   . Arthritis   . Asthma   . Broken wrist   . Bronchitis   . chronic diastolic CHF 2/67/1245  . COPD (chronic obstructive pulmonary disease) ([place])   . Coronary artery disease    a. cath 2013: stenting to RCA (report not available); b. cath 2014: LM nl, pLAD 40%, mLAD nl, ost LCx 40%, mid LCx nl, pRCA 30% @ site of prior stent, mRCA 50%  . Depression   . Diabetes mellitus without complication ([place])   . Diabetic neuropathy ([place])   . dialysis 2006  . Diverticulosis   . Dizziness   . Dyspnea   . Elevated lipids   . Environmental and seasonal allergies   . ESRD (end stage renal disease) on dialysis ([place])    M-W-F  . GERD (gastroesophageal reflux disease)   . Headache   . History of hiatal hernia   . HOH (hard of hearing)   . Hx of pancreatitis 2015  . Hypertension   . Lower extremity edema   . Mitral regurgitation    a. echo 10/2013: EF 62%, noWMA, mildly dilated LA, mild to mod MR/TR, GR1DD  . Myocardial infarction ([place])   . Orthopnea   . Pneumonia   . Renal cancer ([place])   . Renal insufficiency    Pt is on dialysis on M,W + F.  . Wheezing     Past Surgical History:  Procedure Laterality Date  . A/V  SHUNTOGRAM Left 01/20/2018   Procedure: A/V SHUNTOGRAM;  Surgeon: Algernon Huxley, MD;  Location: [place] [facility];  Service: Cardiovascular;  Laterality: Left;  . ABDOMINAL HYSTERECTOMY  1992  . AMPUTATION TOE Left 10/02/2017   Procedure: AMPUTATION TOE-LEFT GREAT TOE;  Surgeon: Albertine Patricia, DPM;  Location: [facility];  Service: Podiatry;  Laterality: Left;  . APPENDECTOMY    . CARDIAC CATHETERIZATION Left 07/26/2015   Procedure: Left Heart Cath and  Coronary Angiography;  Surgeon: Dionisio David, MD;  Location: [facility];  Service: Cardiovascular;  Laterality: Left;  . CATARACT EXTRACTION W/ INTRAOCULAR LENS IMPLANT Right   . CATARACT EXTRACTION W/PHACO Left 03/10/2017   Procedure: CATARACT EXTRACTION PHACO AND INTRAOCULAR LENS PLACEMENT (IOC);  Surgeon: Birder Robson, MD;  Location: [facility];  Service: Ophthalmology;  Laterality: Left;  [place] 00:51.9 AP% 14.2 CDE 7.39 fluid pack lot # [id number]  . CHOLECYSTECTOMY    . COLONOSCOPY WITH PROPOFOL N/A 08/12/2016   Procedure: COLONOSCOPY WITH PROPOFOL;  Surgeon: Lollie Sails, MD;  Location: [facility] ENDOSCOPY;  Service: Endoscopy;  Laterality: N/A;  . DIALYSIS FISTULA CREATION Left    upper arm  . dialysis grafts    . ESOPHAGOGASTRODUODENOSCOPY N/A 03/08/2015   Procedure: ESOPHAGOGASTRODUODENOSCOPY (EGD);  Surgeon: Manya Silvas, MD;  Location: [facility];  Service: Endoscopy;  Laterality: N/A;  . ESOPHAGOGASTRODUODENOSCOPY (EGD) WITH PROPOFOL N/A 03/18/2016   Procedure: ESOPHAGOGASTRODUODENOSCOPY (EGD) WITH PROPOFOL;  Surgeon: [name], MD;  Location: [facility] ENDOSCOPY;  Service: Endoscopy;  Laterality: N/A;  . EYE SURGERY Right 2018  . FECAL TRANSPLANT N/A 08/23/2015   Procedure: FECAL TRANSPLANT;  Surgeon: Manya Silvas, MD;  Location: [facility] ENDOSCOPY;  Service: Endoscopy;  Laterality: N/A;  . HAND SURGERY Bilateral   . PERIPHERAL VASCULAR CATHETERIZATION N/A 12/20/2015   Procedure: Thrombectomy of dialysis access versus permcath placement;  Surgeon: Algernon Huxley, MD;  Location: [place] [facility];  Service: Cardiovascular;  Laterality: N/A;  . PERIPHERAL VASCULAR CATHETERIZATION N/A 12/20/2015   Procedure: A/V Shunt Intervention;  Surgeon: Algernon Huxley, MD;  Location: [place] [facility];  Service: Cardiovascular;  Laterality: N/A;  . PERIPHERAL VASCULAR CATHETERIZATION N/A 12/20/2015   Procedure: A/V Shuntogram/Fistulagram;  Surgeon: Algernon Huxley, MD;  Location: [place] [facility];  Service: Cardiovascular;  Laterality: N/A;  . PERIPHERAL VASCULAR CATHETERIZATION N/A 01/02/2016   Procedure: A/V Shuntogram/Fistulagram;  Surgeon: Algernon Huxley, MD;  Location: [place] [facility];  Service: Cardiovascular;  Laterality: N/A;  . PERIPHERAL VASCULAR CATHETERIZATION N/A 01/02/2016   Procedure: A/V Shunt Intervention;  Surgeon: Algernon Huxley, MD;  Location: [place] [facility];  Service: Cardiovascular;  Laterality: N/A;    Family History  Problem Relation Age of Onset  . Kidney disease Mother   . Diabetes Mother   . Cancer Father   . Kidney disease Sister     Social History:  reports that she quit smoking about 3 years ago. Her smoking use included cigarettes. She has a 20.00 pack-year smoking history. She has never used smokeless tobacco. She reports previous alcohol use. She reports current drug use. Drug: Marijuana.  Allergies  Allergen Reactions  . Ace Inhibitors Swelling and Anaphylaxis  . Ativan [Lorazepam] Other (See Comments)    Reaction:Hallucinations and headaches  . Compazine [Prochlorperazine Edisylate] Anaphylaxis, Nausea And Vomiting and Other (See Comments)    Other reaction(s): dystonia from this vs. Reglan, 23 Jul - patient relates that she takes promethazine frequently with no problems  . Sumatriptan  Succinate Other (See Comments)    Other reaction(s): delirium and hallucinations per [facility] records  . Dilaudid [Hydromorphone Hcl] Other (See Comments)    Delirium   . Ondansetron Other (See Comments)    hallucinations    . Zofran [Ondansetron Hcl] Other (See Comments)    Reaction:  hallucinations   . Codeine Nausea And Vomiting  . Gabapentin Other (See Comments)    Reaction:  Unknown    . [place] Nausea And Vomiting  . Losartan Nausea Only  . Oxycodone Anxiety  . Prochlorperazine Other (See Comments)    Reaction:  Unknown . Patient does not remember reaction but she does have vertigo and anxiety along with n and v at times.  Could be used to treat any of these   . Reglan [Metoclopramide] Other (See Comments)    Per patient her Dr. Evelina Bucy her off it   . Scopolamine Other (See Comments)    Dizziness, also has vertigo already  . Tape Rash  . Tapentadol Rash    Medications:  I have reviewed the patient's current medications. Prior to Admission:  Medications Prior to Admission  Medication Sig Dispense Refill Last Dose  . albuterol (PROVENTIL HFA;VENTOLIN HFA) 108 (90 Base) MCG/ACT inhaler Inhale 2 puffs into the lungs every 6 (six) hours as needed for wheezing or shortness of breath. 1 Inhaler 2 prn at prn  . albuterol (PROVENTIL) (2.5 MG/3ML) 0.083% nebulizer solution Take 2.5 mg by nebulization every 4 (four) hours as needed for wheezing or shortness of breath.   unknown at unknown  . amLODipine (NORVASC) 10 MG tablet Take 10 mg by mouth daily.   unknown at unknown  . aspirin EC 81 MG tablet Take 81 mg by mouth daily.   unknown at unknown  . atorvastatin (LIPITOR) 20 MG tablet Take 20 mg by mouth every evening.    unknown at unknown  . Biotin 1 MG CAPS Take 1 mg by mouth daily.   unknown at unknown  . cetirizine (ZYRTEC) 10 MG tablet Take 10 mg by mouth daily.    unknown at unknown  . cholecalciferol (VITAMIN D) 400 units TABS tablet Take 400 Units by mouth daily.   unknown at unknown  . cyanocobalamin 1000 MCG tablet Take 1,000 mcg by mouth daily.    unknown at unknown  . diclofenac sodium (VOLTAREN) 1 % GEL Apply 1 application topically daily as needed (for rash).   unknown at unknown  . ferrous sulfate ([employer] IRON) 325 (65 FE) MG tablet Take 325 mg by mouth daily with breakfast.   unknown at unknown  . FLUoxetine (PROZAC) 20 MG capsule Take 1 capsule (20 mg total) by mouth daily. (Patient taking differently: Take 20 mg by mouth at bedtime. ) 30 capsule 0 unknown at unknown  . hydrocortisone cream 0.5 % Apply topically 3 (three) times daily. 30 g 0 unknown at unknown  . insulin aspart (NOVOLOG FLEXPEN) 100 UNIT/ML  FlexPen Inject 4-15 Units into the skin 4 (four) times daily as needed for high blood sugar (> 100, per sliding scale).   unknown at unknown  . lidocaine (LIDODERM) 5 % Place 1 patch onto the skin every 12 (twelve) hours. Remove & Discard patch within 12 hours or as directed by MD 10 patch 0 unknown at unknown  . lipase/protease/amylase (CREON) 12000 UNITS CPEP capsule Take 36000 mg in the morning, 24000 midday & 36000 in the evening   unknown at unknown  . LORazepam (ATIVAN PO) Take by mouth.  unknown at unknown  . multivitamin (RENA-VIT) TABS tablet Take 1 tablet by mouth daily.    unknown at unknown  . nitroGLYCERIN (NITROSTAT) 0.4 MG SL tablet Place 0.4 mg under the tongue every 5 (five) minutes as needed for chest pain.    prn at prn  . ranitidine (ZANTAC) 300 MG capsule Take 1 capsule by mouth every evening.  1 unknown at unknown  . sevelamer carbonate (RENVELA) 800 MG tablet Take 800-2,400 mg by mouth See admin instructions. Take 2400 mg by mouth 3 times daily with meals and take 800 mg by mouth with snacks   unknown at unknown   Scheduled: . amLODipine  10 mg Oral Daily  . aspirin  81 mg Oral Daily  . atorvastatin  20 mg Oral QPM  . Chlorhexidine Gluconate Cloth  6 each Topical Q0600  . cholecalciferol  400 Units Oral Daily  . [START ON 10/08/2018] epoetin (EPOGEN/PROCRIT) injection  10,000 Units Intravenous Q M,W,F-HD  . famotidine  20 mg Oral Daily  . ferrous sulfate  325 mg Oral Q breakfast  . FLUoxetine  20 mg Oral QHS  . heparin  5,000 Units Subcutaneous Q8H  . insulin aspart  0-9 Units Subcutaneous TID WC  . ipratropium-albuterol  3 mL Nebulization Q6H  . lipase/protease/amylase  24,000 Units Oral Q lunch  . lipase/protease/amylase  36,000 Units Oral BID AC  . loratadine  10 mg Oral Daily  . meclizine  25 mg Oral BID  . methylPREDNISolone (SOLU-MEDROL) injection  60 mg Intravenous Q24H  . multivitamin  1 tablet Oral Daily  . sevelamer carbonate  2,400 mg Oral TID WC  .  sevelamer carbonate  800 mg Oral BID BM  . sodium chloride flush  3 mL Intravenous Q12H  . cyanocobalamin  1,000 mcg Oral Daily    ROS: History obtained from the patient   General ROS: negative for - chills, fatigue, fever, night sweats, weight gain or weight loss Psychological ROS: negative for - behavioral disorder, hallucinations, memory difficulties, mood swings or suicidal ideation Ophthalmic ROS: negative for - blurry vision, double vision, eye pain or loss of vision ENT ROS: negative for - epistaxis, nasal discharge, oral lesions, sore throat, tinnitus or vertigo Allergy and Immunology ROS: negative for - hives or itchy/watery eyes Hematological and Lymphatic ROS: negative for - bleeding problems, bruising or swollen lymph nodes Endocrine ROS: negative for - galactorrhea, hair pattern changes, polydipsia/polyuria or temperature intolerance Respiratory ROS: negative for - cough, hemoptysis, shortness of breath or wheezing Cardiovascular ROS: negative for - chest pain, dyspnea on exertion, edema or irregular heartbeat Gastrointestinal ROS: negative for - abdominal pain, diarrhea, hematemesis, nausea/vomiting or stool incontinence Genito-Urinary ROS: negative for - dysuria, hematuria, incontinence or urinary frequency/urgency Musculoskeletal ROS: negative for - joint swelling or muscular weakness Neurological ROS: as noted in HPI Dermatological ROS: negative for rash and skin lesion changes  Physical Examination: Blood pressure (!) 169/56, pulse 70, temperature 98.7 F (37.1 C), temperature source Oral, resp. rate 20, height 5' 5"  (1.651 m), weight 86.7 kg, SpO2 100 %.  HEENT-  Normocephalic, no lesions, without obvious abnormality.  Normal external eye and conjunctiva.  Normal TM's bilaterally.  Normal auditory canals and external ears. Normal external nose, mucus membranes and septum.  Normal pharynx. Cardiovascular- S1, S2 normal, pulses palpable throughout   Lungs- chest clear,  no wheezing, rales, normal symmetric air entry Abdomen- soft, non-tender; bowel sounds normal; no masses,  no organomegaly Extremities- no edema Lymph-no adenopathy palpable Musculoskeletal-no joint tenderness,  deformity or swelling Skin-warm and dry, no hyperpigmentation, vitiligo, or suspicious lesions  Neurological Exam   Mental Status: Alert, oriented, thought content appropriate.  Speech fluent without evidence of aphasia.  Able to follow 3 step commands without difficulty. Attention span and concentration seemed appropriate  Cranial Nerves: II: Discs flat bilaterally; Visual fields grossly normal, pupils equal, round, reactive to light and accommodation III,IV, VI: ptosis not present, extra-ocular motions intact bilaterally V,VII: smile symmetric, facial light touch sensation intact VIII: hearing normal bilaterally IX,X: gag reflex present XI: bilateral shoulder shrug XII: midline tongue extension Motor: Right :  Upper extremity   5/5 Without pronator drift      Left: Upper extremity   5/5 without pronator drift Right:   Lower extremity   5/5                                          Left: Lower extremity   5/5 Tone and bulk:normal tone throughout; no atrophy noted Sensory: Pinprick and light touch intact bilaterally Deep Tendon Reflexes: 2+ and symmetric throughout Plantars: Right: mute                              Left: mute Cerebellar: Finger-to-nose testing intact bilaterally. Heel to shin testing normal bilaterally Gait: not tested due to safety concerns  Data Reviewed  Laboratory Studies:   Basic Metabolic Panel: Recent Labs  Lab 10/02/18 1151 10/04/18 1140 10/05/18 0641 10/05/18 1241 10/06/18 0851  NA 134* 135 135  --   --   K 4.3 4.6 5.1  --   --   CL 97* 98 102  --   --   CO2 24 23 21*  --   --   GLUCOSE 221* 159* 97  --   --   BUN 21* 38* 41*  --   --   CREATININE 5.73* 9.04* 10.03*  --   --   CALCIUM 8.4* 8.1* 7.8*  --   --   PHOS  --   --   --  5.3*  4.5    Liver Function Tests: Recent Labs  Lab 10/02/18 1151 10/04/18 1140  AST 24 25  ALT 15 15  ALKPHOS 136* 112  BILITOT 0.8 0.9  PROT 8.3* 7.5  ALBUMIN 4.0 3.6   Recent Labs  Lab 10/02/18 1151 10/04/18 1140  LIPASE 45 83*   Recent Labs  Lab 10/04/18 1140  AMMONIA 17    CBC: Recent Labs  Lab 10/02/18 1151 10/04/18 1140 10/05/18 0641  WBC 5.3 4.8 5.8  NEUTROABS 3.6 2.6  --   HGB 10.1* 9.7* 9.5*  HCT 32.9* 30.7* 28.8*  MCV 94.5 93.6 94.7  PLT 189 209 198    Cardiac Enzymes: Recent Labs  Lab 10/04/18 1140 10/04/18 1906 10/05/18 0023 10/05/18 0641  TROPONINI <0.03 <0.03 <0.03 <0.03    BNP: Invalid input(s): POCBNP  CBG: Recent Labs  Lab 10/05/18 1921 10/05/18 1948 10/05/18 2139 10/06/18 0005 10/06/18 0747  GLUCAP 14* 47 169* 145* 66*    Microbiology: Results for orders placed or performed during the hospital encounter of 10/04/18  MRSA PCR Screening     Status: None   Collection Time: 10/04/18  7:10 PM  Result Value Ref Range Status   MRSA by PCR NEGATIVE NEGATIVE Final    Comment:  The GeneXpert MRSA Assay (FDA approved for NASAL specimens only), is one component of a comprehensive MRSA colonization surveillance program. It is not intended to diagnose MRSA infection nor to guide or monitor treatment for MRSA infections. Performed at [facility], [address] [place], [place] [address]     Coagulation Studies: No results for input(s): LABPROT, INR in the last 72 hours.  Urinalysis: No results for input(s): COLORURINE, LABSPEC, PHURINE, GLUCOSEU, HGBUR, BILIRUBINUR, KETONESUR, PROTEINUR, UROBILINOGEN, NITRITE, LEUKOCYTESUR in the last 168 hours.  Invalid input(s): APPERANCEUR  Lipid Panel:     Component Value Date/Time   CHOL 106 04/22/2016 0442   CHOL 85 05/31/2014 0432   TRIG 59 04/22/2016 0442   TRIG 104 05/31/2014 0432   HDL 72 04/22/2016 0442   HDL 43 05/31/2014 0432   CHOLHDL 1.5 04/22/2016  0442   VLDL 12 04/22/2016 0442   VLDL 21 05/31/2014 0432   LDLCALC 22 04/22/2016 0442   LDLCALC 21 05/31/2014 0432    HgbA1C:  Lab Results  Component Value Date   HGBA1C 6.5 (H) 09/04/2017    Urine Drug Screen:      Component Value Date/Time   LABOPIA NONE DETECTED 06/09/2017 1039   COCAINSCRNUR NONE DETECTED 06/09/2017 1039   LABBENZ NONE DETECTED 06/09/2017 1039   AMPHETMU NONE DETECTED 06/09/2017 1039   THCU NONE DETECTED 06/09/2017 1039   LABBARB NONE DETECTED 06/09/2017 1039    Alcohol Level:  Recent Labs  Lab 10/04/18 1140  ETH <10   Other results: EKG: normal EKG, normal sinus rhythm, unchanged from previous tracings. Vent. rate 66 BPM PR interval * ms QRS duration 90 ms QT/QTc 467/490 ms P-R-T axes 72 50 63  Imaging: Ct Angio Head W Or Wo Contrast  Result Date: 10/04/2018 CLINICAL DATA:  Sudden onset of unresponsiveness beginning this morning. Negative head CT. EXAM: CT ANGIOGRAPHY HEAD AND NECK TECHNIQUE: Multidetector CT imaging of the head and neck was performed using the standard protocol during bolus administration of intravenous contrast. Multiplanar CT image reconstructions and MIPs were obtained to evaluate the vascular anatomy. Carotid stenosis measurements (when applicable) are obtained utilizing NASCET criteria, using the distal internal carotid diameter as the denominator. CONTRAST:  67m OMNIPAQUE IOHEXOL 350 MG/ML SOLN COMPARISON:  Head CT earlier same day. MRI 04/22/2016. FINDINGS: CTA NECK FINDINGS Aortic arch: Mild aortic atherosclerosis. No aneurysm or dissection. Branching pattern of the brachiocephalic vessels is normal. 20% stenosis of the left subclavian artery origin. Right carotid system: Common carotid artery widely patent to the bifurcation. Soft and calcified plaque at the bifurcation and ICA bulb. Minimal diameter in the ICA bulb is 1.8 mm. Compared to a more distal cervical ICA diameter of 4.6 mm, this indicates a 60% stenosis. Left carotid  system: Common carotid artery widely patent to the bifurcation. Soft and calcified plaque at the carotid bifurcation and ICA bulb. Minimal diameter of the ICA bulb is 2 mm. Compared to a more distal cervical ICA diameter of 4 mm, this indicates a 50% stenosis. Vertebral arteries: Both vertebral artery origins are widely patent. Both vertebral arteries are widely patent through the cervical region to the foramen magnum. Skeleton: Normal Other neck: Normal Upper chest: Normal Review of the MIP images confirms the above findings CTA HEAD FINDINGS Anterior circulation: Both internal carotid arteries are patent through the skull base and siphon regions. There is atherosclerotic calcification throughout the carotid siphons but without stenosis greater than 50% suspected. Supraclinoid ICA stenosis likely approaches 70%. The anterior and middle cerebral  vessels show flow. No large or medium vessel occlusion identified. Atherosclerotic narrowing of the left A1 segment. Posterior circulation: Both vertebral arteries are patent to the basilar. No basilar stenosis. Posterior circulation branch vessels are patent. Venous sinuses: Patent and normal. Anatomic variants: None Delayed phase: No abnormal enhancement. Review of the MIP images confirms the above findings IMPRESSION: 1. Atherosclerotic disease at both carotid bifurcations. 60% stenosis of the proximal ICA on the right. 50% stenosis of the proximal ICA on the left. 2. Atherosclerotic disease throughout the carotid siphons but without stenosis greater than 50%. More severe stenosis of the supraclinoid internal carotid arteries on each side, approximately 70%. 3. No intracranial large or medium vessel occlusion or correctable proximal stenosis. No significant posterior circulation pathology. Electronically Signed   By: Nelson Chimes M.D.   On: 10/04/2018 13:54   Ct Angio Neck W And/or Wo Contrast  Result Date: 10/04/2018 CLINICAL DATA:  Sudden onset of unresponsiveness  beginning this morning. Negative head CT. EXAM: CT ANGIOGRAPHY HEAD AND NECK TECHNIQUE: Multidetector CT imaging of the head and neck was performed using the standard protocol during bolus administration of intravenous contrast. Multiplanar CT image reconstructions and MIPs were obtained to evaluate the vascular anatomy. Carotid stenosis measurements (when applicable) are obtained utilizing NASCET criteria, using the distal internal carotid diameter as the denominator. CONTRAST:  54m OMNIPAQUE IOHEXOL 350 MG/ML SOLN COMPARISON:  Head CT earlier same day. MRI 04/22/2016. FINDINGS: CTA NECK FINDINGS Aortic arch: Mild aortic atherosclerosis. No aneurysm or dissection. Branching pattern of the brachiocephalic vessels is normal. 20% stenosis of the left subclavian artery origin. Right carotid system: Common carotid artery widely patent to the bifurcation. Soft and calcified plaque at the bifurcation and ICA bulb. Minimal diameter in the ICA bulb is 1.8 mm. Compared to a more distal cervical ICA diameter of 4.6 mm, this indicates a 60% stenosis. Left carotid system: Common carotid artery widely patent to the bifurcation. Soft and calcified plaque at the carotid bifurcation and ICA bulb. Minimal diameter of the ICA bulb is 2 mm. Compared to a more distal cervical ICA diameter of 4 mm, this indicates a 50% stenosis. Vertebral arteries: Both vertebral artery origins are widely patent. Both vertebral arteries are widely patent through the cervical region to the foramen magnum. Skeleton: Normal Other neck: Normal Upper chest: Normal Review of the MIP images confirms the above findings CTA HEAD FINDINGS Anterior circulation: Both internal carotid arteries are patent through the skull base and siphon regions. There is atherosclerotic calcification throughout the carotid siphons but without stenosis greater than 50% suspected. Supraclinoid ICA stenosis likely approaches 70%. The anterior and middle cerebral vessels show flow. No  large or medium vessel occlusion identified. Atherosclerotic narrowing of the left A1 segment. Posterior circulation: Both vertebral arteries are patent to the basilar. No basilar stenosis. Posterior circulation branch vessels are patent. Venous sinuses: Patent and normal. Anatomic variants: None Delayed phase: No abnormal enhancement. Review of the MIP images confirms the above findings IMPRESSION: 1. Atherosclerotic disease at both carotid bifurcations. 60% stenosis of the proximal ICA on the right. 50% stenosis of the proximal ICA on the left. 2. Atherosclerotic disease throughout the carotid siphons but without stenosis greater than 50%. More severe stenosis of the supraclinoid internal carotid arteries on each side, approximately 70%. 3. No intracranial large or medium vessel occlusion or correctable proximal stenosis. No significant posterior circulation pathology. Electronically Signed   By: MNelson ChimesM.D.   On: 10/04/2018 13:54   Assessment: 58y.o female  with multiple medical issues including ESRD on dialysis M-W-F, diabetes mellitus, COPD, CAD status post stent, diabetic neuropathy, diverticulitis, dizziness, chronic headaches, pancreatitis, hypertension, depression, hyperlipidemia, and CHF presenting to the ED on 10/04/2018 with syncopal episode.  Presentation initially concerning for vertebrobasilar insufficiency, seizures, vasovagal syncope, and orthostatic hypotension, carotid sinus syncope,  TIA or CVA (although LOC requires significant CNS involvement, for which resolution would be delayed). CTA head and neck showed no intracranial large vessel occlusion, significant stenosis or posterior circulation pathology.  CT head also reviewed and shows no acute intracranial abnormality to explain symptoms.  EEG currently pending for further evaluation of possible seizures. Patient still complains of right temporal area pain and tenderness 8/10. With elevated ESR and other associated joint pains concerns  remains for possible temporal arteritis.  Plan: 1.  EEG pending 2.  Start Solumedrol 80 mg IV daily and plan for temporal artery biopsy 2.  Check orthostatics vital signs   This patient was staffed with Dr. Arnaldo Natal who personally evaluated patient, reviewed documentation and agreed with assessment and plan of care as above.  Rufina Falco, DNP, FNP-BC Board certified Nurse Practitioner Neurology Department    10/06/2018, 1:32 PM

## 2018-10-06 NOTE — Progress Notes (Signed)
HD tx end    10/06/18 1312  Vital Signs  Pulse Rate 64  Pulse Rate Source Monitor  Resp 14  BP (!) 174/63  BP Location Right Arm  BP Method Automatic  Patient Position (if appropriate) Lying  Oxygen Therapy  SpO2 100 %  O2 Device Nasal Cannula  O2 Flow Rate (L/min) 2 L/min  During Hemodialysis Assessment  Dialysis Fluid Bolus Normal Saline  Bolus Amount (mL) 250 mL  Intra-Hemodialysis Comments Tx completed

## 2018-10-06 NOTE — Consult Note (Signed)
SURGICAL CONSULTATION NOTE (initial) - cpt: 36644  HISTORY OF PRESENT ILLNESS (HPI):  58 y.o. female presented to [facility] ED 2 days ago (1/15) for evaluation of syncope. Patient's son at that time reported that patient experienced an episode of syncope earlier that day, which prompted him to call EMS. Patient reports she began experiencing new onset of persistent unrelenting Right-sided headache with Right shoulder pain and pain while chewing, for which she has avoided eating foods she needs to chew. Patient also reports she developed a "lazy Right eye" along with her headaches, but denies any blurred or double-vision. She adds that her headache has improved somewhat today after having been administered steroids and pain medication. She otherwise denies any unilateral weakness, slurred speech, vision abnormalities otherwise, or CP. Patient also complains of recent SOB, chest "congestion", and wheezing with generalized weakness, underwent HD today.  Surgery is consulted by hospitalist medical physician Dr. Darvin Neighbours in this context for Right temporal artery biopsy.  PAST MEDICAL HISTORY (PMH):  Past Medical History:  Diagnosis Date  . Anemia   . Anginal pain ([place])   . Anxiety   . Arthritis   . Asthma   . Broken wrist   . Bronchitis   . chronic diastolic CHF 0/34/7425  . COPD (chronic obstructive pulmonary disease) ([place])   . Coronary artery disease    a. cath 2013: stenting to RCA (report not available); b. cath 2014: LM nl, pLAD 40%, mLAD nl, ost LCx 40%, mid LCx nl, pRCA 30% @ site of prior stent, mRCA 50%  . Depression   . Diabetes mellitus without complication ([place])   . Diabetic neuropathy ([place])   . dialysis 2006  . Diverticulosis   . Dizziness   . Dyspnea   . Elevated lipids   . Environmental and seasonal allergies   . ESRD (end stage renal disease) on dialysis ([place])    M-W-F  . GERD (gastroesophageal reflux disease)   . Headache   . History of hiatal hernia   . HOH (hard of  hearing)   . Hx of pancreatitis 2015  . Hypertension   . Lower extremity edema   . Mitral regurgitation    a. echo 10/2013: EF 62%, noWMA, mildly dilated LA, mild to mod MR/TR, GR1DD  . Myocardial infarction ([place])   . Orthopnea   . Pneumonia   . Renal cancer ([place])   . Renal insufficiency    Pt is on dialysis on M,W + F.  . Wheezing     PAST SURGICAL HISTORY ([place]):  Past Surgical History:  Procedure Laterality Date  . A/V SHUNTOGRAM Left 01/20/2018   Procedure: A/V SHUNTOGRAM;  Surgeon: Algernon Huxley, MD;  Location: [place] [facility];  Service: Cardiovascular;  Laterality: Left;  . ABDOMINAL HYSTERECTOMY  1992  . AMPUTATION TOE Left 10/02/2017   Procedure: AMPUTATION TOE-LEFT GREAT TOE;  Surgeon: Albertine Patricia, DPM;  Location: [facility] ORS;  Service: Podiatry;  Laterality: Left;  . APPENDECTOMY    . CARDIAC CATHETERIZATION Left 07/26/2015   Procedure: Left Heart Cath and Coronary Angiography;  Surgeon: Dionisio David, MD;  Location: [place] [facility];  Service: Cardiovascular;  Laterality: Left;  . CATARACT EXTRACTION W/ INTRAOCULAR LENS IMPLANT Right   . CATARACT EXTRACTION W/PHACO Left 03/10/2017   Procedure: CATARACT EXTRACTION PHACO AND INTRAOCULAR LENS PLACEMENT (IOC);  Surgeon: Birder Robson, MD;  Location: [facility] OR[facility];  Service: Ophthalmology;  Laterality: Left;  [place] 00:51.9 AP% 14.2 CDE 7.39 fluid pack lot # [id number]  . CHOLECYSTECTOMY    .  COLONOSCOPY WITH PROPOFOL N/A 08/12/2016   Procedure: COLONOSCOPY WITH PROPOFOL;  Surgeon: Lollie Sails, MD;  Location: [facility] ENDOSCOPY;  Service: Endoscopy;  Laterality: N/A;  . DIALYSIS FISTULA CREATION Left    upper arm  . dialysis grafts    . ESOPHAGOGASTRODUODENOSCOPY N/A 03/08/2015   Procedure: ESOPHAGOGASTRODUODENOSCOPY (EGD);  Surgeon: Manya Silvas, MD;  Location: [facility] ENDOSCOPY;  Service: Endoscopy;  Laterality: N/A;  . ESOPHAGOGASTRODUODENOSCOPY (EGD) WITH PROPOFOL N/A 03/18/2016   Procedure: ESOPHAGOGASTRODUODENOSCOPY  (EGD) WITH PROPOFOL;  Surgeon: [name], MD;  Location: [facility] ENDOSCOPY;  Service: Endoscopy;  Laterality: N/A;  . EYE SURGERY Right 2018  . FECAL TRANSPLANT N/A 08/23/2015   Procedure: FECAL TRANSPLANT;  Surgeon: Manya Silvas, MD;  Location: [facility] ENDOSCOPY;  Service: Endoscopy;  Laterality: N/A;  . HAND SURGERY Bilateral   . PERIPHERAL VASCULAR CATHETERIZATION N/A 12/20/2015   Procedure: Thrombectomy of dialysis access versus permcath placement;  Surgeon: Algernon Huxley, MD;  Location: [place] [facility];  Service: Cardiovascular;  Laterality: N/A;  . PERIPHERAL VASCULAR CATHETERIZATION N/A 12/20/2015   Procedure: A/V Shunt Intervention;  Surgeon: Algernon Huxley, MD;  Location: [place] [facility];  Service: Cardiovascular;  Laterality: N/A;  . PERIPHERAL VASCULAR CATHETERIZATION N/A 12/20/2015   Procedure: A/V Shuntogram/Fistulagram;  Surgeon: Algernon Huxley, MD;  Location: [place] [facility];  Service: Cardiovascular;  Laterality: N/A;  . PERIPHERAL VASCULAR CATHETERIZATION N/A 01/02/2016   Procedure: A/V Shuntogram/Fistulagram;  Surgeon: Algernon Huxley, MD;  Location: [place] [facility];  Service: Cardiovascular;  Laterality: N/A;  . PERIPHERAL VASCULAR CATHETERIZATION N/A 01/02/2016   Procedure: A/V Shunt Intervention;  Surgeon: Algernon Huxley, MD;  Location: [place] [facility];  Service: Cardiovascular;  Laterality: N/A;     MEDICATIONS:  Prior to Admission medications   Medication Sig Start Date End Date Taking? Authorizing Provider  albuterol (PROVENTIL HFA;VENTOLIN HFA) 108 (90 Base) MCG/ACT inhaler Inhale 2 puffs into the lungs every 6 (six) hours as needed for wheezing or shortness of breath. 02/17/18  Yes Merlyn Lot, MD  albuterol (PROVENTIL) (2.5 MG/3ML) 0.083% nebulizer solution Take 2.5 mg by nebulization every 4 (four) hours as needed for wheezing or shortness of breath.   Yes [provider]  amLODipine (NORVASC) 10 MG tablet Take 10 mg by mouth daily.   Yes [provider]  aspirin EC 81 MG tablet Take 81 mg by mouth daily. 09/08/16  Yes [provider]  atorvastatin (LIPITOR) 20 MG tablet Take 20 mg by mouth every evening.  12/02/16  Yes [provider]  Biotin 1 MG CAPS Take 1 mg by mouth daily.   Yes [provider]  cetirizine (ZYRTEC) 10 MG tablet Take 10 mg by mouth daily.    Yes [provider]  cholecalciferol (VITAMIN D) 400 units TABS tablet Take 400 Units by mouth daily.   Yes [provider]  cyanocobalamin 1000 MCG tablet Take 1,000 mcg by mouth daily.    Yes [provider]  diclofenac sodium (VOLTAREN) 1 % GEL Apply 1 application topically daily as needed (for rash).   Yes [provider]  ferrous sulfate ([employer] IRON) 325 (65 FE) MG tablet Take 325 mg by mouth daily with breakfast.   Yes [provider]  FLUoxetine (PROZAC) 20 MG capsule Take 1 capsule (20 mg total) by mouth daily. Patient taking differently: Take 20 mg by mouth at bedtime.  04/10/16  Yes Hower, Aaron Mose, MD  hydrocortisone cream 0.5 % Apply topically  3 (three) times daily. 01/21/18  Yes Wieting, Richard, MD  insulin aspart (NOVOLOG FLEXPEN) 100 UNIT/ML FlexPen Inject 4-15 Units into the skin 4 (four) times daily as needed for high blood sugar (> 100, per sliding scale).   Yes [provider]  lidocaine (LIDODERM) 5 % Place 1 patch onto the skin every 12 (twelve) hours. Remove & Discard patch within 12 hours or as directed by MD 02/20/18 02/20/19 Yes Nance Pear, MD  lipase/protease/amylase (CREON) 12000 UNITS CPEP capsule Take 36000 mg in the morning, 24000 midday & 36000 in the evening   Yes [provider]  LORazepam (ATIVAN PO) Take by mouth.   Yes [provider]  multivitamin (RENA-VIT) TABS tablet Take 1 tablet by mouth daily.    Yes [provider]  nitroGLYCERIN (NITROSTAT) 0.4 MG SL tablet Place 0.4 mg under the tongue every 5 (five) minutes as needed for chest  pain.    Yes [provider]  ranitidine (ZANTAC) 300 MG capsule Take 1 capsule by mouth every evening. 01/19/18  Yes [provider]  sevelamer carbonate (RENVELA) 800 MG tablet Take 800-2,400 mg by mouth See admin instructions. Take 2400 mg by mouth 3 times daily with meals and take 800 mg by mouth with snacks   Yes [provider]     ALLERGIES:  Allergies  Allergen Reactions  . Ace Inhibitors Swelling and Anaphylaxis  . Ativan [Lorazepam] Other (See Comments)    Reaction:Hallucinations and headaches  . Compazine [Prochlorperazine Edisylate] Anaphylaxis, Nausea And Vomiting and Other (See Comments)    Other reaction(s): dystonia from this vs. Reglan, 23 Jul - patient relates that she takes promethazine frequently with no problems  . Sumatriptan Succinate Other (See Comments)    Other reaction(s): delirium and hallucinations per [facility] records  . Dilaudid [Hydromorphone Hcl] Other (See Comments)    Delirium   . Ondansetron Other (See Comments)    hallucinations    . Zofran [Ondansetron Hcl] Other (See Comments)    Reaction:  hallucinations   . Codeine Nausea And Vomiting  . Gabapentin Other (See Comments)    Reaction:  Unknown    . [place] Nausea And Vomiting  . Losartan Nausea Only  . Oxycodone Anxiety  . Prochlorperazine Other (See Comments)    Reaction:  Unknown . Patient does not remember reaction but she does have vertigo and anxiety along with n and v at times. Could be used to treat any of these   . Reglan [Metoclopramide] Other (See Comments)    Per patient her Dr. Evelina Bucy her off it   . Scopolamine Other (See Comments)    Dizziness, also has vertigo already  . Tape Rash  . Tapentadol Rash     SOCIAL HISTORY:  Social History   Socioeconomic History  . Marital status: Divorced    Spouse name: Not on file  . Number of children: Not on file  . Years of education: Not on file  . Highest education level: Not on file  Occupational History   . Not on file  Social Needs  . Financial resource strain: Not hard at all  . Food insecurity:    Worry: Never true    Inability: Never true  . Transportation needs:    Medical: No    Non-medical: No  Tobacco Use  . Smoking status: Former Smoker    Packs/day: 0.50    Years: 40.00    Pack years: 20.00    Types: Cigarettes    Last  attempt to quit: 02/13/2015    Years since quitting: 3.6  . Smokeless tobacco: Never Used  Substance and Sexual Activity  . Alcohol use: Not Currently    Comment: glass wine week per pt  . Drug use: Yes    Types: Marijuana    Comment: once a day  . Sexual activity: Never  Lifestyle  . Physical activity:    Days per week: 7 days    Minutes per session: 10 min  . Stress: Very much  Relationships  . Social connections:    Talks on phone: More than three times a week    Gets together: More than three times a week    Attends religious service: More than 4 times per year    Active member of club or organization: Yes    Attends meetings of clubs or organizations: Never    Relationship status: Divorced  . Intimate partner violence:    Fear of current or ex partner: No    Emotionally abused: No    Physically abused: No    Forced sexual activity: No  Other Topics Concern  . Not on file  Social History Narrative  . Not on file    The patient currently resides (home / rehab facility / nursing home): Home The patient normally is (ambulatory / bedbound): Ambulatory   FAMILY HISTORY:  Family History  Problem Relation Age of Onset  . Kidney disease Mother   . Diabetes Mother   . Cancer Father   . Kidney disease Sister     REVIEW OF SYSTEMS:  Constitutional: denies weight loss, fever, chills, or sweats  Eyes: denies any other vision changes, except as per HPI, or history of eye injury  ENT: denies sore throat, hearing problems  Respiratory: shortness of breath and wheezing as per HPI  Cardiovascular: denies chest pain, palpitations   Gastrointestinal: denies abdominal pain, N/V, or diarrhea Genitourinary: denies burning with urination or urinary frequency Musculoskeletal: denies any other joint pains or cramps except as per HPI Skin: denies any other rashes or skin discolorations  Neurological: headaches and weakness as per HPI, denies any dizziness  Psychiatric: denies any other depression, anxiety   All other review of systems were negative   VITAL SIGNS:  Temp:  [98 F (36.7 C)-98.7 F (37.1 C)] 98.7 F (37.1 C) (01/15 1322) Pulse Rate:  [60-77] 77 (01/15 1719) Resp:  [10-23] 20 (01/15 1322) BP: (133-190)/(49-80) 165/49 (01/15 1719) SpO2:  [86 %-100 %] 100 % (01/15 1719) Weight:  [82.6 kg-87.3 kg] 86.7 kg (01/15 1322)     Height: 5\' 5"  (165.1 cm) Weight: 86.7 kg BMI (Calculated): 31.81   INTAKE/OUTPUT:  This shift: Total I/O In: -  Out: 1004 [Other:1004]  Last 2 shifts: @IOLAST2SHIFTS @   PHYSICAL EXAM:  Constitutional:  -- Normal body habitus  -- Awake, alert, and oriented x3, no apparent distress Eyes:  -- Pupils equally round and reactive to light  -- No scleral icterus, B/L no occular discharge Ear, nose, throat: -- Neck is FROM WNL -- No jugular venous distension  Pulmonary:  -- B/L wheezing -- Breathing non-labored at rest with 2L supplemental O2 via  -- Equal breath sounds bilaterally Cardiovascular:  -- S1, S2 present  -- No pericardial rubs  -- Easily palpable B/L temporal arterial pulses Gastrointestinal:  -- Abdomen soft, nontender, non-distended, no guarding or rebound tenderness -- No abdominal masses appreciated, pulsatile or otherwise  Musculoskeletal and Integumentary:  -- Wounds or skin discoloration: None appreciated -- Extremities: B/L  UE and LE FROM, hands and feet warm  Neurologic:  -- Motor function: Intact and symmetric -- Sensation: Intact and symmetric Psychiatric:  -- Mood and affect WNL  Labs:  CBC Latest Ref Rng & Units 10/05/2018 10/04/2018 10/02/2018  WBC  4.0 - 10.5 K/uL 5.8 4.8 5.3  Hemoglobin 12.0 - 15.0 g/dL 9.5(L) 9.7(L) 10.1(L)  Hematocrit 36.0 - 46.0 % 28.8(L) 30.7(L) 32.9(L)  Platelets 150 - 400 K/uL 198 209 189   CMP Latest Ref Rng & Units 10/05/2018 10/04/2018 10/02/2018  Glucose 70 - 99 mg/dL 97 159(H) 221(H)  BUN 6 - 20 mg/dL 41(H) 38(H) 21(H)  Creatinine 0.44 - 1.00 mg/dL 10.03(H) 9.04(H) 5.73(H)  Sodium 135 - 145 mmol/L 135 135 134(L)  Potassium 3.5 - 5.1 mmol/L 5.1 4.6 4.3  Chloride 98 - 111 mmol/L 102 98 97(L)  CO2 22 - 32 mmol/L 21(L) 23 24  Calcium 8.9 - 10.3 mg/dL 7.8(L) 8.1(L) 8.4(L)  Total Protein 6.5 - 8.1 g/dL - 7.5 8.3(H)  Total Bilirubin 0.3 - 1.2 mg/dL - 0.9 0.8  Alkaline Phos 38 - 126 U/L - 112 136(H)  AST 15 - 41 U/L - 25 24  ALT 0 - 44 U/L - 15 15   Imaging studies:  CTA Head and Neck (10/04/2018) 1. Atherosclerotic disease at both carotid bifurcations. 60% stenosis of the proximal ICA on the right. 50% stenosis of the proximal ICA on the left. 2. Atherosclerotic disease throughout the carotid siphons but without stenosis greater than 50%. More severe stenosis of the supraclinoid internal carotid arteries on each side, approximately 70%. 3. No intracranial large or medium vessel occlusion or correctable proximal stenosis. No significant posterior circulation pathology.  Assessment/Plan: (ICD-10's: R26) 58 y.o. female with concern for temporal arteritis s/p syncopal episode, complicated by comorbidities including DM, HTN, HLD, CAD s/p PCI for MI with chronic diastolic CHF and mitral regurgitation, ESRD on MWF HD, carotid artery stenoses, COPD, history of renal cell CA, peripheral edema, GERD, osteoarthritis, major depression disorder, and generalized anxiety disorder.   - indication for Right temporal artery biopsy discussed with patient and her son (via phone)   - all risks, benefits, and alternatives to excisional Right temporal artery biopsy were discussed with the patient and her son (via phone), all of  their questions were answered to their expressed satisfaction, patient expresses she wishes to proceed, and informed consent was obtained.   - will plan for Right temporal artery biopsy in OR tomorrow pending anesthesia and OR availability   - DVT prophylaxis, ambulation encouraged  All of the above findings and recommendations were discussed with the patient and her family, and all of patient's and her son's questions were answered to their expressed satisfaction.  Thank you for the opportunity to participate in this patient's care.   -- Marilynne Drivers [name], MD, [place]: [facility] - Partnering for exceptional care. Office: [phone]

## 2018-10-06 NOTE — Progress Notes (Signed)
*  PRELIMINARY RESULTS* Echocardiogram 2D Echocardiogram has been performed.  [name] 10/06/2018, 3:36 PM

## 2018-10-06 NOTE — Progress Notes (Signed)
HD tx start    10/06/18 0935  Vital Signs  Pulse Rate 73  Pulse Rate Source Monitor  Resp 18  BP (!) 180/62  BP Location Right Arm  BP Method Automatic  Patient Position (if appropriate) Lying  Oxygen Therapy  SpO2 100 %  O2 Device Nasal Cannula  O2 Flow Rate (L/min) 2 L/min  During Hemodialysis Assessment  Blood Flow Rate (mL/min) 400 mL/min  Arterial Pressure (mmHg) -130 mmHg  Venous Pressure (mmHg) 230 mmHg  Transmembrane Pressure (mmHg) 60 mmHg  Ultrafiltration Rate (mL/min) 430 mL/min  Dialysate Flow Rate (mL/min) 800 ml/min  Conductivity: Machine  13.6  HD Safety Checks Performed Yes  Dialysis Fluid Bolus Normal Saline  Bolus Amount (mL) 250 mL  Intra-Hemodialysis Comments Tx initiated

## 2018-10-06 NOTE — Progress Notes (Addendum)
[place] at [place] NAME: [name]    MR#:  [id number]  DATE OF BIRTH:  [birth year]  SUBJECTIVE:  CHIEF COMPLAINT:   Chief Complaint  Patient presents with  . Altered Mental Status   Laying in bed.  Had hemodialysis earlier. Continues to have shortness of breath, chest congestion and wheezing.  On 2 L oxygen. Continues to complain of right-sided headache  REVIEW OF SYSTEMS:    Review of Systems  Constitutional: Positive for malaise/fatigue. Negative for chills and fever.  HENT: Positive for sinus pain. Negative for sore throat.   Eyes: Negative for blurred vision, double vision and pain.  Respiratory: Positive for cough and wheezing. Negative for hemoptysis and shortness of breath.   Cardiovascular: Negative for chest pain, palpitations, orthopnea and leg swelling.  Gastrointestinal: Negative for abdominal pain, constipation, diarrhea, heartburn, nausea and vomiting.  Genitourinary: Negative for dysuria and hematuria.  Musculoskeletal: Negative for back pain and joint pain.  Skin: Negative for rash.  Neurological: Negative for sensory change, speech change, focal weakness and headaches.  Endo/Heme/Allergies: Does not bruise/bleed easily.  Psychiatric/Behavioral: Negative for depression. The patient is not nervous/anxious.     DRUG ALLERGIES:   Allergies  Allergen Reactions  . Ace Inhibitors Swelling and Anaphylaxis  . Ativan [Lorazepam] Other (See Comments)    Reaction:Hallucinations and headaches  . Compazine [Prochlorperazine Edisylate] Anaphylaxis, Nausea And Vomiting and Other (See Comments)    Other reaction(s): dystonia from this vs. Reglan, 23 Jul - patient relates that she takes promethazine frequently with no problems  . Sumatriptan Succinate Other (See Comments)    Other reaction(s): delirium and hallucinations per [facility] records  . Dilaudid [Hydromorphone Hcl] Other (See Comments)    Delirium   . Ondansetron Other  (See Comments)    hallucinations    . Zofran [Ondansetron Hcl] Other (See Comments)    Reaction:  hallucinations   . Codeine Nausea And Vomiting  . Gabapentin Other (See Comments)    Reaction:  Unknown    . Lac Bovis Nausea And Vomiting  . Losartan Nausea Only  . Oxycodone Anxiety  . Prochlorperazine Other (See Comments)    Reaction:  Unknown . Patient does not remember reaction but she does have vertigo and anxiety along with n and v at times. Could be used to treat any of these   . Reglan [Metoclopramide] Other (See Comments)    Per patient her Dr. Evelina Bucy her off it   . Scopolamine Other (See Comments)    Dizziness, also has vertigo already  . Tape Rash  . Tapentadol Rash    VITALS:  Blood pressure (!) 160/57, pulse 67, temperature 98.7 F (37.1 C), temperature source Oral, resp. rate 20, height 5\' 5"  (1.651 m), weight 86.7 kg, SpO2 100 %.  PHYSICAL EXAMINATION:   Physical Exam  GENERAL:  58 y.o.-year-old patient lying in the bed. EYES: Pupils equal, round, reactive to light and accommodation. No scleral icterus. Extraocular muscles intact.  HEENT: Head atraumatic, normocephalic. Oropharynx and nasopharynx clear.  NECK:  Supple, no jugular venous distention. No thyroid enlargement, no tenderness.  LUNGS: Bilateral wheezing CARDIOVASCULAR: S1, S2 normal. No murmurs, rubs, or gallops.  ABDOMEN: Soft, nontender, nondistended. Bowel sounds present. No organomegaly or mass.  EXTREMITIES: No cyanosis, clubbing or edema b/l.    NEUROLOGIC: Cranial nerves II through XII are intact. No focal Motor or sensory deficits b/l.   PSYCHIATRIC: The patient is alert and oriented x 3.  SKIN: No obvious  rash, lesion, or ulcer.   LABORATORY PANEL:   CBC Recent Labs  Lab 10/05/18 0641  WBC 5.8  HGB 9.5*  HCT 28.8*  PLT 198   ------------------------------------------------------------------------------------------------------------------ Chemistries  Recent Labs  Lab 10/04/18 1140  10/05/18 0641  NA 135 135  K 4.6 5.1  CL 98 102  CO2 23 21*  GLUCOSE 159* 97  BUN 38* 41*  CREATININE 9.04* 10.03*  CALCIUM 8.1* 7.8*  AST 25  --   ALT 15  --   ALKPHOS 112  --   BILITOT 0.9  --    ------------------------------------------------------------------------------------------------------------------  Cardiac Enzymes Recent Labs  Lab 10/05/18 0641  TROPONINI <0.03   ------------------------------------------------------------------------------------------------------------------  RADIOLOGY:  No results found.   ASSESSMENT AND PLAN:   *Right-sided headache.  Discussed with neurology team.  Concern regarding temporal arteritis.  IV Solu-Medrol started.  Discussed with surgery Dr. [name].  Biopsy planned.  * Syncope vs seizure Consulted neurology. No seizure medications at this time Telemetry monitoring.  Echocardiogram done and pending.  Troponin normal.  * Carotid stenosis.  Could be playing a part in her symptoms.  Vascular surgery consulted, awaiting input  * COPD exacerbation with acute hypoxic respiratory failure.  On steroids and scheduled nebulizers. Oxygen as needed.  * Dizziness.  Likely labyrinthitis from her sinus congestion and bronchitis. Added meclizine.  * End-stage renal disease on hemodialysis.  Hemodialysis done today  * Diabetes mellitus.  Sliding scale insulin.  All the records are reviewed and case discussed with Care Management/Social Worker Management plans discussed with the patient, family and they are in agreement.  CODE STATUS:FULL CODE  DVT Prophylaxis: SCDs  TOTAL TIME TAKING CARE OF THIS PATIENT: 35 minutes.   POSSIBLE D/C IN 1-2 DAYS, DEPENDING ON CLINICAL CONDITION.  Leia Alf Annamarie Yamaguchi M.D on 10/06/2018 at 3:17 PM  Between 7am to 6pm - Pager - [phone]  After 6pm go to [email or web address] - password [email or web address] [facility]  [facility] [place] [facility]  Office  [phone]  CC: Primary care physician; Freddy Finner,  NP  Note: This dictation was prepared with Dragon dictation along with smaller phrase technology. Any transcriptional errors that result from this process are unintentional.

## 2018-10-06 NOTE — Progress Notes (Signed)
Patient SAT on room air was 86%, patient placed on 2L  post breathing treatment. Patient does not appear nor states to be in distress. Patient resting comfortably in bed.

## 2018-10-06 NOTE — Progress Notes (Signed)
Pre HD assessment    10/06/18 0923  Vital Signs  Temp 98.5 F (36.9 C)  Temp Source Oral  Pulse Rate 72  Pulse Rate Source Monitor  Resp 17  BP (!) 166/62  BP Location Right Arm  BP Method Automatic  Patient Position (if appropriate) Lying  Oxygen Therapy  SpO2 100 %  O2 Device Nasal Cannula  O2 Flow Rate (L/min) 2 L/min  Pain Assessment  Pain Scale 0-10  Pain Score 0  Dialysis Weight  Weight 87.3 kg  Type of Weight Pre-Dialysis  Time-Out for Hemodialysis  What Procedure? HD  Pt Identifiers(min of two) First/Last Name;MRN/Account#  Correct Site? Yes  Correct Side? Yes  Correct Procedure? Yes  Consents Verified? Yes  Rad Studies Available? N/A  Safety Precautions Reviewed? Yes  Engineer, civil (consulting) Number  ([id number])  Station Number 3  UF/Alarm Test Passed  Conductivity: Meter 13.6  Conductivity: Machine  13.8  pH 7.6  Reverse Osmosis main  Normal Saline Lot Number [id number]  Dialyzer Lot Number [id number]  Disposable Set Lot Number [id number]  Machine Temperature 98.6 F (37 C)  Musician and Audible Yes  Blood Lines Intact and Secured Yes  Pre Treatment Patient Checks  Vascular access used during treatment Graft  Hepatitis B Surface Antigen Results Negative  Date Hepatitis B Surface Antigen Drawn 10/05/18  Hepatitis B Surface Antibody  (>10)  Date Hepatitis B Surface Antibody Drawn 03/09/18  Hemodialysis Consent Verified Yes  Hemodialysis Standing Orders Initiated Yes  ECG (Telemetry) Monitor On Yes  Prime Ordered Normal Saline  Length of  DialysisTreatment -hour(s) 3.5 Hour(s)  Dialyzer Elisio 17H NR  Dialysate 3K, 2.5 Ca  Dialysis Anticoagulant None  Dialysate Flow Ordered 800  Blood Flow Rate Ordered 400 mL/min  Ultrafiltration Goal 1 Liters  Pre Treatment Labs Phosphorus  Dialysis Blood Pressure Support Ordered Normal Saline  Education / Care Plan  Dialysis Education Provided Yes  Documented Education in Care Plan Yes

## 2018-10-06 NOTE — Progress Notes (Signed)
Post HD assessment. Pt tolerated tx well without c/o. Pt experienced increased bleeding at venous access site, MD aware. Net UF 1004, goal met.    10/06/18 1322  Vital Signs  Temp 98.7 F (37.1 C)  Temp Source Oral  Pulse Rate 70  Pulse Rate Source Monitor  Resp 20  BP (!) 169/56  BP Location Right Arm  BP Method Automatic  Patient Position (if appropriate) Lying  Oxygen Therapy  SpO2 100 %  O2 Device Nasal Cannula  O2 Flow Rate (L/min) 2 L/min  Dialysis Weight  Weight 86.7 kg  Type of Weight Post-Dialysis  Post-Hemodialysis Assessment  Rinseback Volume (mL) 250 mL  KECN 77.2 V  Dialyzer Clearance Lightly streaked  Duration of HD Treatment -hour(s) 3.5 hour(s)  Hemodialysis Intake (mL) 500 mL  UF Total -Machine (mL) 1504 mL  Net UF (mL) 1004 mL  Tolerated HD Treatment Yes  AVG/AVF Arterial Site Held (minutes) 10 minutes  AVG/AVF Venous Site Held (minutes) 15 minutes (increased bleeding at venous access site,MD aware)  Education / Care Plan  Dialysis Education Provided Yes  Documented Education in Care Plan Yes   

## 2018-10-06 NOTE — Progress Notes (Signed)
Pt with complaints of a "hurting feeling" that is in the middle of chest radiating to back. This Rn suggested trying something for indigestion. Standing order placed. Will continue to monitor and assess.

## 2018-10-06 NOTE — Procedures (Signed)
History: 58 year old female being evaluated for syncope versus seizure  Sedation: None  Technique: This is a 21 channel routine scalp EEG performed at the bedside with bipolar and monopolar montages arranged in accordance to the international 10/20 system of electrode placement. One channel was dedicated to EKG recording.    Background: The background consists of intermixed alpha and beta activities. There is a well defined posterior dominant rhythm of 9 hz that attenuates with eye opening. Sleep is recorded with normal appearing structures.   Photic stimulation: Physiologic driving is present  EEG Abnormalities: None  Clinical Interpretation: This normal EEG is recorded in the waking and sleep state. There was no seizure or seizure predisposition recorded on this study. Please note that lack of epileptiform activity on EEG does not preclude the possibility of epilepsy.   Roland Rack, MD [facility] [phone]  If 7pm- 7am, please page neurology on call as listed in [place].

## 2018-10-06 NOTE — Progress Notes (Signed)
Post HD assessment    10/06/18 1321  Neurological  Level of Consciousness Responds to Voice  Orientation Level Oriented X4  Respiratory  Respiratory Pattern Regular;Unlabored  Chest Assessment Chest expansion symmetrical  Cardiac  ECG Monitor Yes  Cardiac Rhythm NSR  Vascular  R Radial Pulse +2  L Radial Pulse +2  Edema Generalized;Right upper extremity;Left upper extremity;Right lower extremity;Left lower extremity  Integumentary  Integumentary (WDL) X  Skin Color Appropriate for ethnicity  Musculoskeletal  Musculoskeletal (WDL) X  Generalized Weakness Yes  Assistive Device None  GU Assessment  Genitourinary (WDL) X  Genitourinary Symptoms  (HD)  Psychosocial  Psychosocial (WDL) WDL

## 2018-10-06 NOTE — Progress Notes (Signed)
RN unavailable to give report   10/06/18 0836  Hand-Off documentation  Report given to (Full Name) Stark Bray  Report received from (Full Name) Serenity Chevis Pretty

## 2018-10-07 ENCOUNTER — Encounter
Admission: EM | Disposition: A | Discharge status: Home / Self Care | Payer: Self-pay | Source: Home / Self Care | Attending: Internal Medicine

## 2018-10-07 ENCOUNTER — Encounter: Payer: Self-pay | Admitting: Anesthesiology

## 2018-10-07 LAB — GLUCOSE, CAPILLARY
Glucose-Capillary: 512 mg/dL (ref 70–99)
Glucose-Capillary: 469 mg/dL — ABNORMAL HIGH (ref 70–99)
Glucose-Capillary: 285 mg/dL — ABNORMAL HIGH (ref 70–99)
Glucose-Capillary: 170 mg/dL — ABNORMAL HIGH (ref 70–99)
Glucose-Capillary: 219 mg/dL — ABNORMAL HIGH (ref 70–99)

## 2018-10-07 LAB — BASIC METABOLIC PANEL
Anion gap: 13 (ref 5–15)
BUN: 19 mg/dL (ref 6–20)
CO2: 27 mmol/L (ref 22–32)
Calcium: 8.1 mg/dL — ABNORMAL LOW (ref 8.9–10.3)
Chloride: 89 mmol/L — ABNORMAL LOW (ref 98–111)
Creatinine, Ser: 4.68 mg/dL — ABNORMAL HIGH (ref 0.44–1.00)
GFR calc Af Amer: 11 mL/min — ABNORMAL LOW (ref >60)
GFR calc non Af Amer: 10 mL/min — ABNORMAL LOW (ref >60)
Glucose, Bld: 499 mg/dL — ABNORMAL HIGH (ref 70–99)
Potassium: 4.7 mmol/L (ref 3.5–5.1)
Sodium: 129 mmol/L — ABNORMAL LOW (ref 135–145)

## 2018-10-07 LAB — ECHOCARDIOGRAM COMPLETE
Height: 65 in
Weight: 3058.22 oz

## 2018-10-07 LAB — HEMOGLOBIN A1C
Hgb A1c MFr Bld: 7.8 % — ABNORMAL HIGH (ref 4.8–5.6)
Mean Plasma Glucose: 177.16 mg/dL

## 2018-10-07 SURGERY — BIOPSY TEMPORAL ARTERY
Anesthesia: Choice | Laterality: Right

## 2018-10-07 MED ORDER — INSULIN ASPART 100 UNIT/ML ~~LOC~~ SOLN
20.0000 [IU] | Freq: Once | SUBCUTANEOUS | Status: AC
  Administered 2018-10-07: 20 [IU] via SUBCUTANEOUS
  Filled 2018-10-07: qty 1

## 2018-10-07 MED ORDER — INSULIN GLARGINE 100 UNIT/ML ~~LOC~~ SOLN
20.0000 [IU] | Freq: Once | SUBCUTANEOUS | Status: AC
  Administered 2018-10-07: 20 [IU] via SUBCUTANEOUS
  Filled 2018-10-07: qty 0.2

## 2018-10-07 MED ORDER — LIDOCAINE 4 % EX CREA
TOPICAL_CREAM | Freq: Three times a day (TID) | CUTANEOUS | Status: DC | PRN
  Administered 2018-10-07: 1 via TOPICAL
  Filled 2018-10-07: qty 5

## 2018-10-07 MED ORDER — IPRATROPIUM-ALBUTEROL 0.5-2.5 (3) MG/3ML IN SOLN
3.0000 mL | Freq: Two times a day (BID) | RESPIRATORY_TRACT | Status: DC
  Administered 2018-10-07 – 2018-10-11 (×9): 3 mL via RESPIRATORY_TRACT
  Filled 2018-10-07 (×9): qty 3

## 2018-10-07 MED ORDER — INSULIN ASPART 100 UNIT/ML ~~LOC~~ SOLN
0.0000 [IU] | Freq: Every day | SUBCUTANEOUS | Status: DC
  Administered 2018-10-07: 2 [IU] via SUBCUTANEOUS
  Administered 2018-10-08 – 2018-10-09 (×2): 3 [IU] via SUBCUTANEOUS
  Administered 2018-10-10: 2 [IU] via SUBCUTANEOUS
  Filled 2018-10-07 (×4): qty 1

## 2018-10-07 MED ORDER — GUAIFENESIN-DM 100-10 MG/5ML PO SYRP
5.0000 mL | ORAL_SOLUTION | ORAL | Status: DC | PRN
  Administered 2018-10-07 – 2018-10-09 (×4): 5 mL via ORAL
  Filled 2018-10-07 (×4): qty 5

## 2018-10-07 MED ORDER — INSULIN ASPART 100 UNIT/ML ~~LOC~~ SOLN
0.0000 [IU] | Freq: Three times a day (TID) | SUBCUTANEOUS | Status: DC
  Administered 2018-10-07: 11 [IU] via SUBCUTANEOUS
  Administered 2018-10-07: 4 [IU] via SUBCUTANEOUS
  Administered 2018-10-08: 7 [IU] via SUBCUTANEOUS
  Administered 2018-10-09: 11 [IU] via SUBCUTANEOUS
  Administered 2018-10-09: 3 [IU] via SUBCUTANEOUS
  Administered 2018-10-10 (×3): 4 [IU] via SUBCUTANEOUS
  Administered 2018-10-11: 7 [IU] via SUBCUTANEOUS
  Filled 2018-10-07 (×8): qty 1

## 2018-10-07 SURGICAL SUPPLY — 30 items
ADHESIVE MASTISOL STRL (MISCELLANEOUS) ×2 IMPLANT
APPLIER CLIP 9.375 SM OPEN (CLIP)
BLADE SURG 15 STRL LF DISP TIS (BLADE) ×1 IMPLANT
BLADE SURG 15 STRL SS (BLADE) ×1
CANISTER SUCT 1200ML W/VALVE (MISCELLANEOUS) ×2 IMPLANT
CHLORAPREP W/TINT 26ML (MISCELLANEOUS) ×2 IMPLANT
CLIP APPLIE 9.375 SM OPEN (CLIP) IMPLANT
COVER WAND RF STERILE (DRAPES) ×2 IMPLANT
DERMABOND ADVANCED (GAUZE/BANDAGES/DRESSINGS)
DERMABOND ADVANCED .7 DNX12 (GAUZE/BANDAGES/DRESSINGS) IMPLANT
DRAPE LAPAROTOMY 77X122 PED (DRAPES) ×2 IMPLANT
ELECT REM PT RETURN 9FT ADLT (ELECTROSURGICAL) ×2
ELECTRODE REM PT RTRN 9FT ADLT (ELECTROSURGICAL) ×1 IMPLANT
GAUZE SPONGE 4X4 12PLY STRL (GAUZE/BANDAGES/DRESSINGS) ×2 IMPLANT
GLOVE BIO SURGEON STRL SZ8 (GLOVE) ×4 IMPLANT
GOWN STRL REUS W/ TWL LRG LVL3 (GOWN DISPOSABLE) ×2 IMPLANT
GOWN STRL REUS W/TWL LRG LVL3 (GOWN DISPOSABLE) ×2
KIT TURNOVER KIT A (KITS) ×2 IMPLANT
LABEL OR SOLS (LABEL) ×2 IMPLANT
NEEDLE HYPO 25X1 1.5 SAFETY (NEEDLE) ×2 IMPLANT
NS IRRIG 500ML POUR BTL (IV SOLUTION) ×2 IMPLANT
PACK BASIN MINOR ARMC (MISCELLANEOUS) ×2 IMPLANT
SPONGE LAP 18X18 RF (DISPOSABLE) ×2 IMPLANT
STRIP CLOSURE SKIN 1/2X4 (GAUZE/BANDAGES/DRESSINGS) ×2 IMPLANT
SUT MNCRL 4-0 (SUTURE) ×1
SUT MNCRL 4-0 27XMFL (SUTURE) ×1
SUT VIC AB 3-0 SH 27 (SUTURE) ×2
SUT VIC AB 3-0 SH 27X BRD (SUTURE) ×2 IMPLANT
SUTURE MNCRL 4-0 27XMF (SUTURE) ×1 IMPLANT
SYR 10ML LL (SYRINGE) ×2 IMPLANT

## 2018-10-07 NOTE — Progress Notes (Signed)
[place] at [place] NAME: [name]    MR#:  [id number]  DATE OF BIRTH:  [birth year]  SUBJECTIVE:  CHIEF COMPLAINT:   Chief Complaint  Patient presents with  . Altered Mental Status   Sitting at side of the bed.  Feels improved.  Less congestion in chest.  Headache is still present.  On 2 L oxygen  REVIEW OF SYSTEMS:    Review of Systems  Constitutional: Positive for malaise/fatigue. Negative for chills and fever.  HENT: Positive for sinus pain. Negative for sore throat.   Eyes: Negative for blurred vision, double vision and pain.  Respiratory: Positive for cough and wheezing. Negative for hemoptysis and shortness of breath.   Cardiovascular: Negative for chest pain, palpitations, orthopnea and leg swelling.  Gastrointestinal: Negative for abdominal pain, constipation, diarrhea, heartburn, nausea and vomiting.  Genitourinary: Negative for dysuria and hematuria.  Musculoskeletal: Negative for back pain and joint pain.  Skin: Negative for rash.  Neurological: Negative for sensory change, speech change, focal weakness and headaches.  Endo/Heme/Allergies: Does not bruise/bleed easily.  Psychiatric/Behavioral: Negative for depression. The patient is not nervous/anxious.     DRUG ALLERGIES:   Allergies  Allergen Reactions  . Ace Inhibitors Swelling and Anaphylaxis  . Ativan [Lorazepam] Other (See Comments)    Reaction:Hallucinations and headaches  . Compazine [Prochlorperazine Edisylate] Anaphylaxis, Nausea And Vomiting and Other (See Comments)    Other reaction(s): dystonia from this vs. Reglan, 23 Jul - patient relates that she takes promethazine frequently with no problems  . Sumatriptan Succinate Other (See Comments)    Other reaction(s): delirium and hallucinations per [facility] records  . Dilaudid [Hydromorphone Hcl] Other (See Comments)    Delirium   . Ondansetron Other (See Comments)    hallucinations    . Zofran [Ondansetron  Hcl] Other (See Comments)    Reaction:  hallucinations   . Codeine Nausea And Vomiting  . Gabapentin Other (See Comments)    Reaction:  Unknown    . Lac Bovis Nausea And Vomiting  . Losartan Nausea Only  . Oxycodone Anxiety  . Prochlorperazine Other (See Comments)    Reaction:  Unknown . Patient does not remember reaction but she does have vertigo and anxiety along with n and v at times. Could be used to treat any of these   . Reglan [Metoclopramide] Other (See Comments)    Per patient her Dr. Evelina Bucy her off it   . Scopolamine Other (See Comments)    Dizziness, also has vertigo already  . Tape Rash  . Tapentadol Rash    VITALS:  Blood pressure (!) 170/66, pulse 80, temperature 98.8 F (37.1 C), temperature source Oral, resp. rate 20, height 5\' 5"  (1.651 m), weight 81.5 kg, SpO2 100 %.  PHYSICAL EXAMINATION:   Physical Exam  GENERAL:  58 y.o.-year-old patient lying in the bed. EYES: Pupils equal, round, reactive to light and accommodation. No scleral icterus. Extraocular muscles intact.  HEENT: Head atraumatic, normocephalic. Oropharynx and nasopharynx clear.  Bilateral temporal tenderness NECK:  Supple, no jugular venous distention. No thyroid enlargement, no tenderness.  LUNGS: Bilateral wheezing.  Good air entry CARDIOVASCULAR: S1, S2 normal. No murmurs, rubs, or gallops.  ABDOMEN: Soft, nontender, nondistended. Bowel sounds present. No organomegaly or mass.  EXTREMITIES: No cyanosis, clubbing or edema b/l.    NEUROLOGIC: Cranial nerves II through XII are intact. No focal Motor or sensory deficits b/l.   PSYCHIATRIC: The patient is alert and oriented x 3.  SKIN: No obvious rash, lesion, or ulcer.   LABORATORY PANEL:   CBC Recent Labs  Lab 10/05/18 0641  WBC 5.8  HGB 9.5*  HCT 28.8*  PLT 198   ------------------------------------------------------------------------------------------------------------------ Chemistries  Recent Labs  Lab 10/04/18 1140   10/07/18 0753  NA 135   < > 129*  K 4.6   < > 4.7  CL 98   < > 89*  CO2 23   < > 27  GLUCOSE 159*   < > 499*  BUN 38*   < > 19  CREATININE 9.04*   < > 4.68*  CALCIUM 8.1*   < > 8.1*  AST 25  --   --   ALT 15  --   --   ALKPHOS 112  --   --   BILITOT 0.9  --   --    < > = values in this interval not displayed.   ------------------------------------------------------------------------------------------------------------------  Cardiac Enzymes Recent Labs  Lab 10/05/18 0641  TROPONINI <0.03   ------------------------------------------------------------------------------------------------------------------  RADIOLOGY:  No results found.   ASSESSMENT AND PLAN:   *Severe hyperglycemia due to IV steroids.  Stat dose of 20 units NovoLog given.  Stat dose of Lantus.  Sliding scale insulin increased to resistant scale.  Check hemoglobin A1c. Likely remain uncontrolled due to being on IV steroids.  *Right-sided headache.  Discussed with neurology team.  Concern regarding temporal arteritis.  IV Solu-Medrol started.  Biopsy planned by surgery once blood sugars improved  * Syncope vs seizure  Consulted neurology  No seizure medications at this time  Telemetry monitoring.  Echocardiogram done and nothing acute troponin normal. EEG checked and normal.  * Carotid stenosis.  Could be playing a part in her symptoms.  Vascular surgery consulted, advised outpatient follow-up  * COPD exacerbation with acute hypoxic respiratory failure.  On steroids and scheduled nebulizers. Oxygen as needed.  * Dizziness.  Likely labyrinthitis from her sinus congestion and bronchitis. Added meclizine.  Improving  * End-stage renal disease on hemodialysis.  Hemodialysis done today  All the records are reviewed and case discussed with Care Management/Social Worker Management plans discussed with the patient, family and they are in agreement.  CODE STATUS:FULL CODE  DVT Prophylaxis: SCDs  TOTAL  TIME TAKING CARE OF THIS PATIENT: 35 minutes.   POSSIBLE D/C IN 1-2 DAYS, DEPENDING ON CLINICAL CONDITION.  Neita Carp M.D on 10/07/2018 at 2:08 PM  Between 7am to 6pm - Pager - [phone]  After 6pm go to [email or web address] - password [email or web address] [facility]  [facility] [place] [facility]  Office  [phone]  CC: Primary care physician; Freddy Finner, NP  Note: This dictation was prepared with Dragon dictation along with smaller phrase technology. Any transcriptional errors that result from this process are unintentional.

## 2018-10-07 NOTE — Progress Notes (Signed)
Subjective: Patient continues to complain of right side headache.  She is currently being managed with IV steroids and Norco.  Denies other associated or new symptoms.  Pending right temporal artery biopsy.  Objective: Current vital signs: BP (!) 170/66 (BP Location: Right Arm)   Pulse 80   Temp 98.8 F (37.1 C) (Oral)   Resp 20   Ht 5\' 5"  (1.651 m)   Wt 81.5 kg   SpO2 100%   BMI 29.90 kg/m  Vital signs in last 24 hours: Temp:  [98 F (36.7 C)-98.8 F (37.1 C)] 98.8 F (37.1 C) (01/16 0725) Pulse Rate:  [77-93] 80 (01/16 0725) Resp:  [20] 20 (01/16 0544) BP: (157-170)/(49-67) 170/66 (01/16 0725) SpO2:  [100 %] 100 % (01/16 0725) FiO2 (%):  [28 %] 28 % (01/16 0252) Weight:  [81.5 kg] 81.5 kg (01/16 0544)  Intake/Output from previous day: 01/15 0701 - 01/16 0700 In: 3 [I.V.:3] Out: 1004  Intake/Output this shift: No intake/output data recorded. Nutritional status:  Diet Order            Diet heart healthy/carb modified Room service appropriate? Yes; Fluid consistency: Thin; Fluid restriction: 1200 mL Fluid  Diet effective now             Neurological Exam  Mental Status: Alert, oriented, thought content appropriate. Speech fluent without evidence of aphasia. Able to follow 3 step commands without difficulty. Attention span and concentration seemed appropriate  Cranial Nerves: II: Discs flat bilaterally; Visual fields grossly normal, pupils equal, round, reactive to light and accommodation III,IV, VI: ptosis not present, extra-ocular motions intact bilaterally V,VII: smile symmetric, facial light touch sensationintact VIII: hearing normal bilaterally IX,X: gag reflex present XI: bilateral shoulder shrug XII: midline tongue extension Motor: Right :Upper extremity 5/5Without pronator driftLeft: Upper extremity 5/5 without pronator drift Right:Lower extremity 5/5Left: Lower extremity 5/5 Tone and  bulk:normal tone throughout; no atrophy noted Sensory: Pinprick and light touchintact bilaterally Deep Tendon Reflexes: 2+ and symmetric throughout Plantars: Right:muteLeft: mute Cerebellar: Finger-to-nosetesting intact bilaterally.Heel to shin testing normal bilaterally Gait: not tested due to safety concerns  Data Reviewed  Lab Results: Basic Metabolic Panel: Recent Labs  Lab 10/02/18 1151 10/04/18 1140 10/05/18 0641 10/05/18 1241 10/06/18 0851 10/07/18 0753  NA 134* 135 135  --   --  129*  K 4.3 4.6 5.1  --   --  4.7  CL 97* 98 102  --   --  89*  CO2 24 23 21*  --   --  27  GLUCOSE 221* 159* 97  --   --  499*  BUN 21* 38* 41*  --   --  19  CREATININE 5.73* 9.04* 10.03*  --   --  4.68*  CALCIUM 8.4* 8.1* 7.8*  --   --  8.1*  PHOS  --   --   --  5.3* 4.5  --     Liver Function Tests: Recent Labs  Lab 10/02/18 1151 10/04/18 1140  AST 24 25  ALT 15 15  ALKPHOS 136* 112  BILITOT 0.8 0.9  PROT 8.3* 7.5  ALBUMIN 4.0 3.6   Recent Labs  Lab 10/02/18 1151 10/04/18 1140  LIPASE 45 83*   Recent Labs  Lab 10/04/18 1140  AMMONIA 17    CBC: Recent Labs  Lab 10/02/18 1151 10/04/18 1140 10/05/18 0641  WBC 5.3 4.8 5.8  NEUTROABS 3.6 2.6  --   HGB 10.1* 9.7* 9.5*  HCT 32.9* 30.7* 28.8*  MCV 94.5 93.6 94.7  PLT 189 209 198    Cardiac Enzymes: Recent Labs  Lab 10/04/18 1140 10/04/18 1906 10/05/18 0023 10/05/18 0641  TROPONINI <0.03 <0.03 <0.03 <0.03    Lipid Panel: No results for input(s): CHOL, TRIG, HDL, CHOLHDL, VLDL, LDLCALC in the last 168 hours.  CBG: Recent Labs  Lab 10/06/18 1720 10/06/18 2102 10/07/18 0727 10/07/18 0732 10/07/18 1151  GLUCAP 160* 179* 48* 469* 46*    Microbiology: Results for orders placed or performed during the hospital encounter of 10/04/18  MRSA PCR Screening     Status: None   Collection Time: 10/04/18  7:10 PM  Result Value Ref Range Status   MRSA by PCR NEGATIVE  NEGATIVE Final    Comment:        The GeneXpert MRSA Assay (FDA approved for NASAL specimens only), is one component of a comprehensive MRSA colonization surveillance program. It is not intended to diagnose MRSA infection nor to guide or monitor treatment for MRSA infections. Performed at [facility], [address] [place], [place] [address]     Coagulation Studies: No results for input(s): LABPROT, INR in the last 72 hours.  Imaging: No results found.  Medications:  I have reviewed the patient's current medications. Prior to Admission:  Medications Prior to Admission  Medication Sig Dispense Refill Last Dose  . albuterol (PROVENTIL HFA;VENTOLIN HFA) 108 (90 Base) MCG/ACT inhaler Inhale 2 puffs into the lungs every 6 (six) hours as needed for wheezing or shortness of breath. 1 Inhaler 2 prn at prn  . albuterol (PROVENTIL) (2.5 MG/3ML) 0.083% nebulizer solution Take 2.5 mg by nebulization every 4 (four) hours as needed for wheezing or shortness of breath.   unknown at unknown  . amLODipine (NORVASC) 10 MG tablet Take 10 mg by mouth daily.   unknown at unknown  . aspirin EC 81 MG tablet Take 81 mg by mouth daily.   unknown at unknown  . atorvastatin (LIPITOR) 20 MG tablet Take 20 mg by mouth every evening.    unknown at unknown  . Biotin 1 MG CAPS Take 1 mg by mouth daily.   unknown at unknown  . cetirizine (ZYRTEC) 10 MG tablet Take 10 mg by mouth daily.    unknown at unknown  . cholecalciferol (VITAMIN D) 400 units TABS tablet Take 400 Units by mouth daily.   unknown at unknown  . cyanocobalamin 1000 MCG tablet Take 1,000 mcg by mouth daily.    unknown at unknown  . diclofenac sodium (VOLTAREN) 1 % GEL Apply 1 application topically daily as needed (for rash).   unknown at unknown  . ferrous sulfate ([employer] IRON) 325 (65 FE) MG tablet Take 325 mg by mouth daily with breakfast.   unknown at unknown  . FLUoxetine (PROZAC) 20 MG capsule Take 1 capsule (20 mg total) by mouth  daily. (Patient taking differently: Take 20 mg by mouth at bedtime. ) 30 capsule 0 unknown at unknown  . hydrocortisone cream 0.5 % Apply topically 3 (three) times daily. 30 g 0 unknown at unknown  . insulin aspart (NOVOLOG FLEXPEN) 100 UNIT/ML FlexPen Inject 4-15 Units into the skin 4 (four) times daily as needed for high blood sugar (> 100, per sliding scale).   unknown at unknown  . lidocaine (LIDODERM) 5 % Place 1 patch onto the skin every 12 (twelve) hours. Remove & Discard patch within 12 hours or as directed by MD 10 patch 0 unknown at unknown  . lipase/protease/amylase (CREON) 12000 UNITS CPEP capsule Take 36000 mg  in the morning, 24000 midday & 36000 in the evening   unknown at unknown  . LORazepam (ATIVAN PO) Take by mouth.   unknown at unknown  . multivitamin (RENA-VIT) TABS tablet Take 1 tablet by mouth daily.    unknown at unknown  . nitroGLYCERIN (NITROSTAT) 0.4 MG SL tablet Place 0.4 mg under the tongue every 5 (five) minutes as needed for chest pain.    prn at prn  . ranitidine (ZANTAC) 300 MG capsule Take 1 capsule by mouth every evening.  1 unknown at unknown  . sevelamer carbonate (RENVELA) 800 MG tablet Take 800-2,400 mg by mouth See admin instructions. Take 2400 mg by mouth 3 times daily with meals and take 800 mg by mouth with snacks   unknown at unknown   Scheduled: . amLODipine  10 mg Oral Daily  . aspirin  81 mg Oral Daily  . atorvastatin  20 mg Oral QPM  . Chlorhexidine Gluconate Cloth  6 each Topical Q0600  . cholecalciferol  400 Units Oral Daily  . [START ON 10/08/2018] epoetin (EPOGEN/PROCRIT) injection  10,000 Units Intravenous Q M,W,F-HD  . famotidine  20 mg Oral Daily  . ferrous sulfate  325 mg Oral Q breakfast  . FLUoxetine  20 mg Oral QHS  . heparin  5,000 Units Subcutaneous Q8H  . insulin aspart  0-20 Units Subcutaneous TID WC  . insulin aspart  0-5 Units Subcutaneous QHS  . ipratropium-albuterol  3 mL Nebulization BID  . lipase/protease/amylase  24,000  Units Oral Q lunch  . lipase/protease/amylase  36,000 Units Oral BID AC  . loratadine  10 mg Oral Daily  . meclizine  25 mg Oral BID  . methylPREDNISolone (SOLU-MEDROL) injection  80 mg Intravenous Daily  . multivitamin  1 tablet Oral Daily  . sevelamer carbonate  2,400 mg Oral TID WC  . sevelamer carbonate  800 mg Oral BID BM  . sodium chloride flush  3 mL Intravenous Q12H  . cyanocobalamin  1,000 mcg Oral Daily   Assessment: 58 y.o female with multiple medical issues including ESRD on dialysis M-W-F, diabetes mellitus, COPD, CAD status post stent, diabetic neuropathy, diverticulitis, dizziness, chronic headaches, pancreatitis, hypertension, depression, hyperlipidemia, and CHF presenting to the ED on 10/04/2018 with syncopal episode. Presentation initially concerning for vertebrobasilar insufficiency, seizures, vasovagal syncope, and orthostatic hypotension, carotid sinus syncope,  TIA or CVA (although LOC requires significant CNS involvement, for which resolution would be delayed). CTA head and neck showed no intracranial large vessel occlusion, significant stenosis or posterior circulation pathology.  CT head also reviewed and shows no acute intracranial abnormality to explain symptoms.  EEG was normal with no epileptiform discharges noted.  Consult possible temporal arteritis.  Pending biopsy.  Patient continues complains of right temporal area pain and tenderness.  IV steroids started with patient reporting slight improvement.    Plan: 1.  Continue Solumedrol 80 mg IV daily pending temporal artery biopsy 2.  Agree with current pain management.  This patient was staffed with Dr. Arnaldo Natal who personally evaluated patient, reviewed documentation and agreed with assessment and plan of care as above.  Rufina Falco, DNP, FNP-BC Board certified Nurse Practitioner Neurology Department     LOS: 1 day   10/07/2018  3:39 PM

## 2018-10-07 NOTE — Progress Notes (Signed)
[facility]  ROUNDING NOTE   Subjective:  Overall patient has improved as compared to admission. Continues dialysis on and we have scheduled.  Objective:  Vital signs in last 24 hours:  Temp:  [98 F (36.7 C)-98.8 F (37.1 C)] 98.8 F (37.1 C) (01/16 0725) Pulse Rate:  [67-93] 80 (01/16 0725) Resp:  [20] 20 (01/16 0544) BP: (157-170)/(49-67) 170/66 (01/16 0725) SpO2:  [100 %] 100 % (01/16 0725) FiO2 (%):  [28 %] 28 % (01/16 0252) Weight:  [81.5 kg] 81.5 kg (01/16 0544)  Weight change: -1.1 kg Filed Weights   10/06/18 0923 10/06/18 1322 10/07/18 0544  Weight: 87.3 kg 86.7 kg 81.5 kg    Intake/Output: I/O last 3 completed shifts: In: 3 [I.V.:3] Out: 1004 [[id number]]   Intake/Output this shift:  No intake/output data recorded.  Physical Exam: General: No acute distress  Head: Normocephalic, atraumatic. Moist oral mucosal membranes  Eyes: Anicteric  Neck: Supple, trachea midline  Lungs:  Clear to auscultation, normal effort  Heart: S1S2 no rubs  Abdomen:  Soft, nontender, bowel sounds present  Extremities: Trace peripheral edema.  Neurologic: Awake, alert, following commands  Skin: No lesions  Access: AV graft    Basic Metabolic Panel: Recent Labs  Lab 10/02/18 1151 10/04/18 1140 10/05/18 0641 10/05/18 1241 10/06/18 0851 10/07/18 0753  NA 134* 135 135  --   --  129*  K 4.3 4.6 5.1  --   --  4.7  CL 97* 98 102  --   --  89*  CO2 24 23 21*  --   --  27  GLUCOSE 221* 159* 97  --   --  499*  BUN 21* 38* 41*  --   --  19  CREATININE 5.73* 9.04* 10.03*  --   --  4.68*  CALCIUM 8.4* 8.1* 7.8*  --   --  8.1*  PHOS  --   --   --  5.3* 4.5  --     Liver Function Tests: Recent Labs  Lab 10/02/18 1151 10/04/18 1140  AST 24 25  ALT 15 15  ALKPHOS 136* 112  BILITOT 0.8 0.9  PROT 8.3* 7.5  ALBUMIN 4.0 3.6   Recent Labs  Lab 10/02/18 1151 10/04/18 1140  LIPASE 45 83*   Recent Labs  Lab 10/04/18 1140  AMMONIA 17    CBC: Recent  Labs  Lab 10/02/18 1151 10/04/18 1140 10/05/18 0641  WBC 5.3 4.8 5.8  NEUTROABS 3.6 2.6  --   HGB 10.1* 9.7* 9.5*  HCT 32.9* 30.7* 28.8*  MCV 94.5 93.6 94.7  PLT 189 209 198    Cardiac Enzymes: Recent Labs  Lab 10/04/18 1140 10/04/18 1906 10/05/18 0023 10/05/18 0641  TROPONINI <0.03 <0.03 <0.03 <0.03    BNP: Invalid input(s): POCBNP  CBG: Recent Labs  Lab 10/06/18 1720 10/06/18 2102 10/07/18 0727 10/07/18 0732 10/07/18 1151  GLUCAP 160* 179* 74* 30* 85*    Microbiology: Results for orders placed or performed during the hospital encounter of 10/04/18  MRSA PCR Screening     Status: None   Collection Time: 10/04/18  7:10 PM  Result Value Ref Range Status   MRSA by PCR NEGATIVE NEGATIVE Final    Comment:        The GeneXpert MRSA Assay (FDA approved for NASAL specimens only), is one component of a comprehensive MRSA colonization surveillance program. It is not intended to diagnose MRSA infection nor to guide or monitor treatment for MRSA infections. Performed at [facility], [address] [place], [place] [address]     Coagulation Studies: No results for input(s): LABPROT, INR in the last 72 hours.  Urinalysis: No results for input(s): COLORURINE, LABSPEC, PHURINE, GLUCOSEU, HGBUR, BILIRUBINUR, KETONESUR, PROTEINUR, UROBILINOGEN, NITRITE, LEUKOCYTESUR in the last 72 hours.  Invalid input(s): APPERANCEUR    Imaging: No results found.   Medications:   . sodium chloride    .  ceFAZolin (ANCEF) IV     . amLODipine  10 mg Oral Daily  . aspirin  81 mg Oral Daily  . atorvastatin  20 mg Oral QPM  . Chlorhexidine Gluconate Cloth  6 each Topical Q0600  . cholecalciferol  400 Units Oral Daily  . [START ON 10/08/2018] epoetin (EPOGEN/PROCRIT) injection  10,000 Units Intravenous Q M,W,F-HD  . famotidine  20 mg Oral Daily  . ferrous sulfate  325 mg Oral Q breakfast  . FLUoxetine  20 mg Oral QHS  . heparin  5,000 Units Subcutaneous Q8H   . insulin aspart  0-20 Units Subcutaneous TID WC  . insulin aspart  0-5 Units Subcutaneous QHS  . ipratropium-albuterol  3 mL Nebulization BID  . lipase/protease/amylase  24,000 Units Oral Q lunch  . lipase/protease/amylase  36,000 Units Oral BID AC  . loratadine  10 mg Oral Daily  . meclizine  25 mg Oral BID  . methylPREDNISolone (SOLU-MEDROL) injection  80 mg Intravenous Daily  . multivitamin  1 tablet Oral Daily  . sevelamer carbonate  2,400 mg Oral TID WC  . sevelamer carbonate  800 mg Oral BID BM  . sodium chloride flush  3 mL Intravenous Q12H  . cyanocobalamin  1,000 mcg Oral Daily   sodium chloride, acetaminophen **OR** acetaminophen, albuterol, alum & mag hydroxide-simeth, HYDROcodone-acetaminophen, nitroGLYCERIN, ondansetron **OR** ondansetron (ZOFRAN) IV, promethazine, sodium chloride flush  Assessment/ Plan:  58 y.o. female with diabetes, hypertension, hyperlipidemia, diabetic retinopathy, diabetic neuropathy, diabetic gastroparesis, pancreatic insufficiency, depression, GERD. Renal masses, M-spike   MWF/ [facility], left arm AVG  1.  ESRD on HD MWF.  Patient completed dialysis yesterday.  No acute indication for dialysis today.  We will plan for dialysis again tomorrow.  2.  Anemia chronic kidney disease.  Hemoglobin currently 9.5.  She has history of renal masses however we previously discussed with the patient to use of Epogen in this setting.   -  Continue Epogen at this time.   3.  Secondary hyperparathyroidism.  Maintain the patient on current dosage of Renvela with meals and snacks.  4.  Renal masses.  Followed by Dr. Erlene Quan.  CT scan was performed on 10/02/18.   LOS: 1 Thuan Tippett 1/16/20202:10 PM

## 2018-10-07 NOTE — Progress Notes (Signed)
Dr Darvin Neighbours states that patient's procedure his been cancelled for now, patient can have diet of heart healthy/renal.

## 2018-10-07 NOTE — Progress Notes (Signed)
[name] MRN: [id number] DOB: August 29, 1961   Cancelled Treatment:    Reason Eval/Treat Not Completed: Other (comment);Patient declined, no reason specified(Patient sitting EOB at start of session, [place] not in place, RN notified. [name] and asked PT to try later. PT will follow up as able.)  Lieutenant Diego PT, DPT 1:54 PM,10/07/18 [phone]

## 2018-10-07 NOTE — Progress Notes (Signed)
Dr Darvin Neighbours notified of patient's complaint of right neck pain patient states that she uses a lidocaine cream at home and would like to see if she can get it.  Patient had already received PRN norco earlier today for joints pain.  MD states "let me talk to Pharmacy and see if we can get her anything"

## 2018-10-07 NOTE — Consult Note (Signed)
[facility] Cardiology Consultation Note  Patient ID: [name], MRN: [id number], DOB/AGE: 04/11/1961 58 y.o. Admit date: 10/04/2018   Date of Consult: 10/07/2018 Primary Physician: Freddy Finner, NP Primary Cardiologist: None  Chief Complaint:  Chief Complaint  Patient presents with  . Altered Mental Status   Reason for Consult: Known cardiovascular disease  HPI: 58 y.o. female with known essential hypertension mixed hyperlipidemia diabetes with complication and significant chronic kidney disease stage V on dialysis.  The patient has had appropriate medication management for these chronic conditions and has been very stable.  She has had apparently a previous minimal inferior myocardial infarction PCI and stent placement of right coronary artery in 2013 which has been stable.  She is doing physical activity with stable shortness of breath consistent with COPD and decreased exercise tolerance with no evidence of heart failure or true angina at this time.  The patient also has appropriate lipid management with atorvastatin hypertension control with medication management including amlodipine.  The patient has had recent issues with right-sided headache and right-sided shoulder pain.  This is correlating to possibility of temporal arteritis.  The patient has been on prednisone with slight improvements but not completely resolved.  The patient has had recent angiogram from the neck showing 50 to 60% carotid atherosclerosis but stable.  There is been no evidence of myocardial infarction and current EKG shows normal sinus rhythm.  Therefore the patient is at low risk for cardiovascular complication with treatment and/or biopsy of temporal arteritis  Past Medical History:  Diagnosis Date  . Anemia   . Anginal pain ([place])   . Anxiety   . Arthritis   . Asthma   . Broken wrist   . Bronchitis   . chronic diastolic CHF 8/00/3491  . COPD (chronic obstructive pulmonary disease) ([place])   . Coronary  artery disease    a. cath 2013: stenting to RCA (report not available); b. cath 2014: LM nl, pLAD 40%, mLAD nl, ost LCx 40%, mid LCx nl, pRCA 30% @ site of prior stent, mRCA 50%  . Depression   . Diabetes mellitus without complication ([place])   . Diabetic neuropathy ([place])   . dialysis 2006  . Diverticulosis   . Dizziness   . Dyspnea   . Elevated lipids   . Environmental and seasonal allergies   . ESRD (end stage renal disease) on dialysis ([place])    M-W-F  . GERD (gastroesophageal reflux disease)   . Headache   . History of hiatal hernia   . HOH (hard of hearing)   . Hx of pancreatitis 2015  . Hypertension   . Lower extremity edema   . Mitral regurgitation    a. echo 10/2013: EF 62%, noWMA, mildly dilated LA, mild to mod MR/TR, GR1DD  . Myocardial infarction ([place])   . Orthopnea   . Pneumonia   . Renal cancer ([place])   . Renal insufficiency    Pt is on dialysis on M,W + F.  . Wheezing       Surgical History:  Past Surgical History:  Procedure Laterality Date  . A/V SHUNTOGRAM Left 01/20/2018   Procedure: A/V SHUNTOGRAM;  Surgeon: Algernon Huxley, MD;  Location: [place] [facility];  Service: Cardiovascular;  Laterality: Left;  . ABDOMINAL HYSTERECTOMY  1992  . AMPUTATION TOE Left 10/02/2017   Procedure: AMPUTATION TOE-LEFT GREAT TOE;  Surgeon: Albertine Patricia, DPM;  Location: [facility];  Service: Podiatry;  Laterality: Left;  . APPENDECTOMY    .  CARDIAC CATHETERIZATION Left 07/26/2015   Procedure: Left Heart Cath and Coronary Angiography;  Surgeon: Dionisio David, MD;  Location: [facility];  Service: Cardiovascular;  Laterality: Left;  . CATARACT EXTRACTION W/ INTRAOCULAR LENS IMPLANT Right   . CATARACT EXTRACTION W/PHACO Left 03/10/2017   Procedure: CATARACT EXTRACTION PHACO AND INTRAOCULAR LENS PLACEMENT (IOC);  Surgeon: Birder Robson, MD;  Location: [facility];  Service: Ophthalmology;  Laterality: Left;  [place] 00:51.9 AP% 14.2 CDE 7.39 fluid pack lot # [id number]  .  CHOLECYSTECTOMY    . COLONOSCOPY WITH PROPOFOL N/A 08/12/2016   Procedure: COLONOSCOPY WITH PROPOFOL;  Surgeon: Lollie Sails, MD;  Location: [facility];  Service: Endoscopy;  Laterality: N/A;  . DIALYSIS FISTULA CREATION Left    upper arm  . dialysis grafts    . ESOPHAGOGASTRODUODENOSCOPY N/A 03/08/2015   Procedure: ESOPHAGOGASTRODUODENOSCOPY (EGD);  Surgeon: Manya Silvas, MD;  Location: [facility] ENDOS[facility];  Service: Endoscopy;  Laterality: N/A;  . ESOPHAGOGASTRODUODENOSCOPY (EGD) WITH PROPOFOL N/A 03/18/2016   Procedure: ESOPHAGOGASTRODUODENOSCOPY (EGD) WITH PROPOFOL;  Surgeon: [name], MD;  Location: [facility] ENDOSCOPY;  Service: Endoscopy;  Laterality: N/A;  . EYE SURGERY Right 2018  . FECAL TRANSPLANT N/A 08/23/2015   Procedure: FECAL TRANSPLANT;  Surgeon: Manya Silvas, MD;  Location: [facility] ENDOSCOPY;  Service: Endoscopy;  Laterality: N/A;  . HAND SURGERY Bilateral   . PERIPHERAL VASCULAR CATHETERIZATION N/A 12/20/2015   Procedure: Thrombectomy of dialysis access versus permcath placement;  Surgeon: Algernon Huxley, MD;  Location: [place] [facility];  Service: Cardiovascular;  Laterality: N/A;  . PERIPHERAL VASCULAR CATHETERIZATION N/A 12/20/2015   Procedure: A/V Shunt Intervention;  Surgeon: Algernon Huxley, MD;  Location: [place] [facility];  Service: Cardiovascular;  Laterality: N/A;  . PERIPHERAL VASCULAR CATHETERIZATION N/A 12/20/2015   Procedure: A/V Shuntogram/Fistulagram;  Surgeon: Algernon Huxley, MD;  Location: [place] [facility];  Service: Cardiovascular;  Laterality: N/A;  . PERIPHERAL VASCULAR CATHETERIZATION N/A 01/02/2016   Procedure: A/V Shuntogram/Fistulagram;  Surgeon: Algernon Huxley, MD;  Location: [place] [facility];  Service: Cardiovascular;  Laterality: N/A;  . PERIPHERAL VASCULAR CATHETERIZATION N/A 01/02/2016   Procedure: A/V Shunt Intervention;  Surgeon: Algernon Huxley, MD;  Location: [place] [facility];  Service: Cardiovascular;  Laterality: N/A;     Home  Meds: Prior to Admission medications   Medication Sig Start Date End Date Taking? Authorizing Provider  albuterol (PROVENTIL HFA;VENTOLIN HFA) 108 (90 Base) MCG/ACT inhaler Inhale 2 puffs into the lungs every 6 (six) hours as needed for wheezing or shortness of breath. 02/17/18  Yes Merlyn Lot, MD  albuterol (PROVENTIL) (2.5 MG/3ML) 0.083% nebulizer solution Take 2.5 mg by nebulization every 4 (four) hours as needed for wheezing or shortness of breath.   Yes [provider]  amLODipine (NORVASC) 10 MG tablet Take 10 mg by mouth daily.   Yes [provider]  aspirin EC 81 MG tablet Take 81 mg by mouth daily. 09/08/16  Yes [provider]  atorvastatin (LIPITOR) 20 MG tablet Take 20 mg by mouth every evening.  12/02/16  Yes [provider]  Biotin 1 MG CAPS Take 1 mg by mouth daily.   Yes [provider]  cetirizine (ZYRTEC) 10 MG tablet Take 10 mg by mouth daily.    Yes [provider]  cholecalciferol (VITAMIN D) 400 units TABS tablet Take 400 Units by mouth daily.   Yes [provider]  cyanocobalamin 1000 MCG tablet Take 1,000 mcg by mouth daily.  Yes [provider]  diclofenac sodium (VOLTAREN) 1 % GEL Apply 1 application topically daily as needed (for rash).   Yes [provider]  ferrous sulfate ([employer] IRON) 325 (65 FE) MG tablet Take 325 mg by mouth daily with breakfast.   Yes [provider]  FLUoxetine (PROZAC) 20 MG capsule Take 1 capsule (20 mg total) by mouth daily. Patient taking differently: Take 20 mg by mouth at bedtime.  04/10/16  Yes Hower, Aaron Mose, MD  hydrocortisone cream 0.5 % Apply topically 3 (three) times daily. 01/21/18  Yes Wieting, Richard, MD  insulin aspart (NOVOLOG FLEXPEN) 100 UNIT/ML FlexPen Inject 4-15 Units into the skin 4 (four) times daily as needed for high blood sugar (> 100, per sliding scale).   Yes [provider]  lidocaine (LIDODERM) 5 % Place 1 patch onto  the skin every 12 (twelve) hours. Remove & Discard patch within 12 hours or as directed by MD 02/20/18 02/20/19 Yes Nance Pear, MD  lipase/protease/amylase (CREON) 12000 UNITS CPEP capsule Take 36000 mg in the morning, 24000 midday & 36000 in the evening   Yes [provider]  LORazepam (ATIVAN PO) Take by mouth.   Yes [provider]  multivitamin (RENA-VIT) TABS tablet Take 1 tablet by mouth daily.    Yes [provider]  nitroGLYCERIN (NITROSTAT) 0.4 MG SL tablet Place 0.4 mg under the tongue every 5 (five) minutes as needed for chest pain.    Yes [provider]  ranitidine (ZANTAC) 300 MG capsule Take 1 capsule by mouth every evening. 01/19/18  Yes [provider]  sevelamer carbonate (RENVELA) 800 MG tablet Take 800-2,400 mg by mouth See admin instructions. Take 2400 mg by mouth 3 times daily with meals and take 800 mg by mouth with snacks   Yes [provider]    Inpatient Medications:  . amLODipine  10 mg Oral Daily  . aspirin  81 mg Oral Daily  . atorvastatin  20 mg Oral QPM  . Chlorhexidine Gluconate Cloth  6 each Topical Q0600  . cholecalciferol  400 Units Oral Daily  . [START ON 10/08/2018] epoetin (EPOGEN/PROCRIT) injection  10,000 Units Intravenous Q M,W,F-HD  . famotidine  20 mg Oral Daily  . ferrous sulfate  325 mg Oral Q breakfast  . FLUoxetine  20 mg Oral QHS  . heparin  5,000 Units Subcutaneous Q8H  . insulin aspart  0-20 Units Subcutaneous TID WC  . insulin aspart  0-5 Units Subcutaneous QHS  . insulin glargine  20 Units Subcutaneous Once  . ipratropium-albuterol  3 mL Nebulization Q6H WA  . lipase/protease/amylase  24,000 Units Oral Q lunch  . lipase/protease/amylase  36,000 Units Oral BID AC  . loratadine  10 mg Oral Daily  . meclizine  25 mg Oral BID  . methylPREDNISolone (SOLU-MEDROL) injection  80 mg Intravenous Daily  . multivitamin  1 tablet Oral Daily  . sevelamer carbonate  2,400 mg Oral TID WC  .  sevelamer carbonate  800 mg Oral BID BM  . sodium chloride flush  3 mL Intravenous Q12H  . cyanocobalamin  1,000 mcg Oral Daily   . sodium chloride    .  ceFAZolin (ANCEF) IV      Allergies:  Allergies  Allergen Reactions  . Ace Inhibitors Swelling and Anaphylaxis  . Ativan [Lorazepam] Other (See Comments)    Reaction:Hallucinations and headaches  . Compazine [Prochlorperazine Edisylate] Anaphylaxis, Nausea And Vomiting and Other (See Comments)    Other reaction(s): dystonia from  this vs. Reglan, 23 Jul - patient relates that she takes promethazine frequently with no problems  . Sumatriptan Succinate Other (See Comments)    Other reaction(s): delirium and hallucinations per [facility] records  . Dilaudid [Hydromorphone Hcl] Other (See Comments)    Delirium   . Ondansetron Other (See Comments)    hallucinations    . Zofran [Ondansetron Hcl] Other (See Comments)    Reaction:  hallucinations   . Codeine Nausea And Vomiting  . Gabapentin Other (See Comments)    Reaction:  Unknown    . [place] Nausea And Vomiting  . Losartan Nausea Only  . Oxycodone Anxiety  . Prochlorperazine Other (See Comments)    Reaction:  Unknown . Patient does not remember reaction but she does have vertigo and anxiety along with n and v at times. Could be used to treat any of these   . Reglan [Metoclopramide] Other (See Comments)    Per patient her Dr. Evelina Bucy her off it   . Scopolamine Other (See Comments)    Dizziness, also has vertigo already  . Tape Rash  . Tapentadol Rash    Social History   Socioeconomic History  . Marital status: Divorced    Spouse name: Not on file  . Number of children: Not on file  . Years of education: Not on file  . Highest education level: Not on file  Occupational History  . Not on file  Social Needs  . Financial resource strain: Not hard at all  . Food insecurity:    Worry: Never true    Inability: Never true  . Transportation needs:    Medical: No     Non-medical: No  Tobacco Use  . Smoking status: Former Smoker    Packs/day: 0.50    Years: 40.00    Pack years: 20.00    Types: Cigarettes    Last attempt to quit: 02/13/2015    Years since quitting: 3.6  . Smokeless tobacco: Never Used  Substance and Sexual Activity  . Alcohol use: Not Currently    Comment: glass wine week per pt  . Drug use: Yes    Types: Marijuana    Comment: once a day  . Sexual activity: Never  Lifestyle  . Physical activity:    Days per week: 7 days    Minutes per session: 10 min  . Stress: Very much  Relationships  . Social connections:    Talks on phone: More than three times a week    Gets together: More than three times a week    Attends religious service: More than 4 times per year    Active member of club or organization: Yes    Attends meetings of clubs or organizations: Never    Relationship status: Divorced  . Intimate partner violence:    Fear of current or ex partner: No    Emotionally abused: No    Physically abused: No    Forced sexual activity: No  Other Topics Concern  . Not on file  Social History Narrative  . Not on file     Family History  Problem Relation Age of Onset  . Kidney disease Mother   . Diabetes Mother   . Cancer Father   . Kidney disease Sister      Review of Systems Positive for headache visual issues Negative for: General:  chills, fever, night sweats or weight changes.  Cardiovascular: PND orthopnea syncope dizziness  Dermatological skin lesions rashes Respiratory: Cough congestion Urologic:  Frequent urination urination at night and hematuria Abdominal: negative for nausea, vomiting, diarrhea, bright red blood per rectum, melena, or hematemesis Neurologic: Positive for visual changes, and/or hearing changes  All other systems reviewed and are otherwise negative except as noted above.  Labs: Recent Labs    10/04/18 1140 10/04/18 1906 10/05/18 0023 10/05/18 0641  TROPONINI <0.03 <0.03 <0.03 <0.03    Lab Results  Component Value Date   WBC 5.8 10/05/2018   HGB 9.5 (L) 10/05/2018   HCT 28.8 (L) 10/05/2018   MCV 94.7 10/05/2018   PLT 198 10/05/2018    Recent Labs  Lab 10/04/18 1140  10/07/18 0753  NA 135   < > 129*  K 4.6   < > 4.7  CL 98   < > 89*  CO2 23   < > 27  BUN 38*   < > 19  CREATININE 9.04*   < > 4.68*  CALCIUM 8.1*   < > 8.1*  PROT 7.5  --   --   BILITOT 0.9  --   --   ALKPHOS 112  --   --   ALT 15  --   --   AST 25  --   --   GLUCOSE 159*   < > 499*   < > = values in this interval not displayed.   Lab Results  Component Value Date   CHOL 106 04/22/2016   HDL 72 04/22/2016   LDLCALC 22 04/22/2016   TRIG 59 04/22/2016   No results found for: DDIMER  Radiology/Studies:  Ct Abdomen Pelvis Wo Contrast  Result Date: 10/02/2018 CLINICAL DATA:  Generalized abdominal pain with nausea, vomiting, and diarrhea. EXAM: CT ABDOMEN AND PELVIS WITHOUT CONTRAST TECHNIQUE: Multidetector CT imaging of the abdomen and pelvis was performed following the standard protocol without IV contrast. COMPARISON:  CT abdomen dated March 02, 2018. CT abdomen pelvis dated January 17, 2018. FINDINGS: Lower chest: No acute abnormality. Mild subsegmental atelectasis at the lung bases. Unchanged trace right pleural effusion. Unchanged mild cardiomegaly. Hepatobiliary: No focal liver abnormality is seen. No gallstones, gallbladder wall thickening, or biliary dilatation. Pancreas: Unremarkable. No pancreatic ductal dilatation or surrounding inflammatory changes. Spleen: Normal in size without focal abnormality. Adrenals/Urinary Tract: The adrenal glands are unremarkable. Unchanged severe bilateral renal atrophy. Known small right renal masses are not well evaluated without intravenous contrast. No renal calculi or hydronephrosis. The bladder is unremarkable. Stomach/Bowel: Stomach is within normal limits. Appendix is surgically absent. No evidence of bowel wall thickening, distention, or inflammatory  changes. Mild colonic diverticulosis. Vascular/Lymphatic: Aortic atherosclerosis. No enlarged abdominal or pelvic lymph nodes. Reproductive: Status post hysterectomy. No adnexal masses. Other: No abdominal wall hernia or abnormality. No abdominopelvic ascites. No pneumoperitoneum. Musculoskeletal: No acute or significant osseous findings. IMPRESSION: 1.  No acute intra-abdominal process. 2. Unchanged severe bilateral renal atrophy. Known small right renal masses are not well evaluated without intravenous contrast. 3. Unchanged trace right pleural effusion. 4.  Aortic atherosclerosis (ICD10-I70.0). Electronically Signed   By: Titus Dubin M.D.   On: 10/02/2018 16:04   Ct Angio Head W Or Wo Contrast  Result Date: 10/04/2018 CLINICAL DATA:  Sudden onset of unresponsiveness beginning this morning. Negative head CT. EXAM: CT ANGIOGRAPHY HEAD AND NECK TECHNIQUE: Multidetector CT imaging of the head and neck was performed using the standard protocol during bolus administration of intravenous contrast. Multiplanar CT image reconstructions and MIPs were obtained to evaluate the vascular anatomy. Carotid stenosis measurements (when applicable) are obtained utilizing NASCET  criteria, using the distal internal carotid diameter as the denominator. CONTRAST:  43mL OMNIPAQUE IOHEXOL 350 MG/ML SOLN COMPARISON:  Head CT earlier same day. MRI 04/22/2016. FINDINGS: CTA NECK FINDINGS Aortic arch: Mild aortic atherosclerosis. No aneurysm or dissection. Branching pattern of the brachiocephalic vessels is normal. 20% stenosis of the left subclavian artery origin. Right carotid system: Common carotid artery widely patent to the bifurcation. Soft and calcified plaque at the bifurcation and ICA bulb. Minimal diameter in the ICA bulb is 1.8 mm. Compared to a more distal cervical ICA diameter of 4.6 mm, this indicates a 60% stenosis. Left carotid system: Common carotid artery widely patent to the bifurcation. Soft and calcified plaque  at the carotid bifurcation and ICA bulb. Minimal diameter of the ICA bulb is 2 mm. Compared to a more distal cervical ICA diameter of 4 mm, this indicates a 50% stenosis. Vertebral arteries: Both vertebral artery origins are widely patent. Both vertebral arteries are widely patent through the cervical region to the foramen magnum. Skeleton: Normal Other neck: Normal Upper chest: Normal Review of the MIP images confirms the above findings CTA HEAD FINDINGS Anterior circulation: Both internal carotid arteries are patent through the skull base and siphon regions. There is atherosclerotic calcification throughout the carotid siphons but without stenosis greater than 50% suspected. Supraclinoid ICA stenosis likely approaches 70%. The anterior and middle cerebral vessels show flow. No large or medium vessel occlusion identified. Atherosclerotic narrowing of the left A1 segment. Posterior circulation: Both vertebral arteries are patent to the basilar. No basilar stenosis. Posterior circulation branch vessels are patent. Venous sinuses: Patent and normal. Anatomic variants: None Delayed phase: No abnormal enhancement. Review of the MIP images confirms the above findings IMPRESSION: 1. Atherosclerotic disease at both carotid bifurcations. 60% stenosis of the proximal ICA on the right. 50% stenosis of the proximal ICA on the left. 2. Atherosclerotic disease throughout the carotid siphons but without stenosis greater than 50%. More severe stenosis of the supraclinoid internal carotid arteries on each side, approximately 70%. 3. No intracranial large or medium vessel occlusion or correctable proximal stenosis. No significant posterior circulation pathology. Electronically Signed   By: Nelson Chimes M.D.   On: 10/04/2018 13:54   Ct Head Wo Contrast  Result Date: 10/04/2018 CLINICAL DATA:  Headache and altered level of consciousness. EXAM: CT HEAD WITHOUT CONTRAST TECHNIQUE: Contiguous axial images were obtained from the base  of the skull through the vertex without intravenous contrast. COMPARISON:  08/14/2017 FINDINGS: Brain: The brain shows a normal appearance without evidence of malformation, atrophy, old or acute small or large vessel infarction, mass lesion, hemorrhage, hydrocephalus or extra-axial collection. Vascular: No hyperdense vessel. No evidence of atherosclerotic calcification. Skull: Normal. No traumatic finding. No focal bone lesion. Sinuses/Orbits: Sinuses are clear. Orbits appear normal. Mastoids are clear. Other: None significant IMPRESSION: Normal head CT. Electronically Signed   By: Nelson Chimes M.D.   On: 10/04/2018 11:27   Ct Angio Neck W And/or Wo Contrast  Result Date: 10/04/2018 CLINICAL DATA:  Sudden onset of unresponsiveness beginning this morning. Negative head CT. EXAM: CT ANGIOGRAPHY HEAD AND NECK TECHNIQUE: Multidetector CT imaging of the head and neck was performed using the standard protocol during bolus administration of intravenous contrast. Multiplanar CT image reconstructions and MIPs were obtained to evaluate the vascular anatomy. Carotid stenosis measurements (when applicable) are obtained utilizing NASCET criteria, using the distal internal carotid diameter as the denominator. CONTRAST:  30mL OMNIPAQUE IOHEXOL 350 MG/ML SOLN COMPARISON:  Head CT earlier same day. MRI  04/22/2016. FINDINGS: CTA NECK FINDINGS Aortic arch: Mild aortic atherosclerosis. No aneurysm or dissection. Branching pattern of the brachiocephalic vessels is normal. 20% stenosis of the left subclavian artery origin. Right carotid system: Common carotid artery widely patent to the bifurcation. Soft and calcified plaque at the bifurcation and ICA bulb. Minimal diameter in the ICA bulb is 1.8 mm. Compared to a more distal cervical ICA diameter of 4.6 mm, this indicates a 60% stenosis. Left carotid system: Common carotid artery widely patent to the bifurcation. Soft and calcified plaque at the carotid bifurcation and ICA bulb.  Minimal diameter of the ICA bulb is 2 mm. Compared to a more distal cervical ICA diameter of 4 mm, this indicates a 50% stenosis. Vertebral arteries: Both vertebral artery origins are widely patent. Both vertebral arteries are widely patent through the cervical region to the foramen magnum. Skeleton: Normal Other neck: Normal Upper chest: Normal Review of the MIP images confirms the above findings CTA HEAD FINDINGS Anterior circulation: Both internal carotid arteries are patent through the skull base and siphon regions. There is atherosclerotic calcification throughout the carotid siphons but without stenosis greater than 50% suspected. Supraclinoid ICA stenosis likely approaches 70%. The anterior and middle cerebral vessels show flow. No large or medium vessel occlusion identified. Atherosclerotic narrowing of the left A1 segment. Posterior circulation: Both vertebral arteries are patent to the basilar. No basilar stenosis. Posterior circulation branch vessels are patent. Venous sinuses: Patent and normal. Anatomic variants: None Delayed phase: No abnormal enhancement. Review of the MIP images confirms the above findings IMPRESSION: 1. Atherosclerotic disease at both carotid bifurcations. 60% stenosis of the proximal ICA on the right. 50% stenosis of the proximal ICA on the left. 2. Atherosclerotic disease throughout the carotid siphons but without stenosis greater than 50%. More severe stenosis of the supraclinoid internal carotid arteries on each side, approximately 70%. 3. No intracranial large or medium vessel occlusion or correctable proximal stenosis. No significant posterior circulation pathology. Electronically Signed   By: Nelson Chimes M.D.   On: 10/04/2018 13:54   Dg Chest Portable 1 View  Result Date: 10/04/2018 CLINICAL DATA:  Sudden onset of mental status change. EXAM: PORTABLE CHEST 1 VIEW COMPARISON:  06/17/2018 FINDINGS: Heart size is normal. There is a poor inspiration. There may be venous  hypertension but there is no frank edema, infiltrate, collapse or effusion. Vascular stent present on both sides, presumably venous. IMPRESSION: Poor inspiration. Possible venous hypertension without frank edema. Electronically Signed   By: Nelson Chimes M.D.   On: 10/04/2018 12:09    EKG: Normal sinus rhythm otherwise normal EKG  Weights: Filed Weights   10/06/18 0923 10/06/18 1322 10/07/18 0544  Weight: 87.3 kg 86.7 kg 81.5 kg     Physical Exam: Blood pressure (!) 170/66, pulse 80, temperature 98.8 F (37.1 C), temperature source Oral, resp. rate 20, height 5\' 5"  (1.651 m), weight 81.5 kg, SpO2 100 %. Body mass index is 29.9 kg/m. General: Well developed, well nourished, in no acute distress. Head eyes ears nose throat: Normocephalic, atraumatic, sclera non-icteric, no xanthomas, nares are without discharge. No apparent thyromegaly and/or mass  Lungs: Normal respiratory effort.  no wheezes, no rales, no rhonchi.  Heart: RRR with normal S1 S2.  With left upper sternal and clavicular murmur consistent with fistula murmur gallop, no rub, PMI is normal size and placement, carotid upstroke normal without bruit, jugular venous pressure is normal Abdomen: Soft, non-tender, non-distended with normoactive bowel sounds. No hepatomegaly. No rebound/guarding. No obvious abdominal masses.  Abdominal aorta is normal size without bruit Extremities: Trace edema. no cyanosis, no clubbing, no ulcers  Peripheral : 2+ bilateral upper extremity pulses with left upper arm fistula, 2+ bilateral femoral pulses, 2+ bilateral dorsal pedal pulse Neuro: Alert and oriented. No facial asymmetry. No focal deficit. Moves all extremities spontaneously. Musculoskeletal: Normal muscle tone without kyphosis Psych:  Responds to questions appropriately with a normal affect.    Assessment: 58 year old female with diabetes with complication essential hypertension mixed hyperlipidemia chronic kidney disease stage V on dialysis  with known history of coronary artery disease on appropriate medication management without evidence of congestive heart failure or angina at lowest risk possible for cardiovascular complication with surgical intervention and/or biopsy for temporal arteritis  Plan: 1.  Proceed to temporal arteritis biopsy and treatment options without restriction 2.  Continue hypertension control with a goal systolic blood pressure below 130 mm including amlodipine 3.  High intensity cholesterol therapy 4.  No further cardiac diagnostics necessary at this time with normal EKG and no evidence of chest pain anginal equivalent and/or congestive heart failure with known coronary artery atherosclerosis 5.  Ambulation after biopsy and treatment and follow-up in 1 to 2 weeks as an outpatient with Nehemiah Massed for further adjustments and risk management of cardiovascular disease  Signed, Corey Skains M.D. [facility] 10/07/2018, 9:40 AM

## 2018-10-07 NOTE — Progress Notes (Signed)
[facility] Daily Progress Note   Patient known to our service. Last seen on 09/30/18. CTA reviewed with Dr. Lucky Cowboy. Moderate carotid artery disease. No indication for carotid intervention at this time. We will continue to follow her carotid disease as an out patient. Patient to keep her already scheduled follow up visit.   Vascular surgical risk would be minimal with her current disease.   Discussed with Dr. Ellis Parents Ahsley Attwood PA-C 10/07/2018 11:55 AM

## 2018-10-07 NOTE — Progress Notes (Addendum)
SURGICAL PROGRESS NOTE (cpt: [phone])  Patient seen and examined as described below by surgical PA-C, Ardell Isaacs.  Assessment/Plan: (ICD-10's: R59) 58 y.o. female with concern for temporal arteritis s/p syncopal episode, complicated by comorbidities including DM, HTN, HLD, CAD s/p PCI for MI with chronic diastolic CHF and mitral regurgitation, ESRD on MWF HD, carotid artery stenoses, COPD, history of renal cell CA, peripheral edema, GERD, osteoarthritis, major depression disorder, and generalized anxiety disorder.   - will proceed with Right temporal artery biopsy pending anesthesia and OR availability  - Right temporal artery biopsy scheduled for today has been cancelled pending medicine, cardiology, and vascular surgery clearances per anesthesia requirement despite procedure able to be performed with local anesthetic   - will again make NPO after midnight tonight if Right temporal artery biopsy can be coordinated between patient's scheduled hemodialysis tomorrow and OR availability  - NPO order discontinued and patient's diet was resumed accordingly  - medical management as per primary team  I have personally reviewed the patient's chart, evaluated/examined the patient, proposed the recommended management, and discussed these recommendations with the patientto her expressed satisfaction as well as with patient's RN and medical physician.  Thank you for the opportunity to participate in this patient's care.  -- Marilynne Drivers [name], MD, [place]: [facility] - Partnering for exceptional care. Office: [facility] SURGICAL PROGRESS NOTE (cpt [phone])  Hospital Day(s): 1.   Post op day(s): Day of Surgery.   Interval History: Patient seen and examined, no acute events or new complaints overnight. Patient reports mild right sided headache but this is not changed from her other right-sided headaches. She denied any fever,  chills, nausea, emesis, or vision changes. No other complaints this morning.   Review of Systems:  Constitutional: denies fever, chills  Respiratory: denies any shortness of breath  Cardiovascular: denies chest pain or palpitations  Gastrointestinal: denies abdominal pain, N/V, or diarrhea/and bowel function as per interval history Neurological: + HA, denied vision/hearing changes   Vital signs in last 24 hours: [min-max] current  Temp:  [98 F (36.7 C)-98.8 F (37.1 C)] 98.8 F (37.1 C) (01/16 0725) Pulse Rate:  [64-93] 80 (01/16 0725) Resp:  [10-23] 20 (01/16 0544) BP: (133-190)/(49-67) 170/66 (01/16 0725) SpO2:  [100 %] 100 % (01/16 0725) FiO2 (%):  [28 %] 28 % (01/16 0252) Weight:  [81.5 kg-87.3 kg] 81.5 kg (01/16 0544)     Height: 5\' 5"  (845.3 cm) Weight: 81.5 kg BMI (Calculated): 29.9   Intake/Output this shift:  No intake/output data recorded.   Intake/Output last 2 shifts:  @IOLAST2SHIFTS @   Physical Exam:  Constitutional: alert, cooperative and no distress  HENT: normocephalic without obvious abnormality  Eyes: PERRL, EOM's grossly intact and symmetric  Neuro: CN II - XII grossly intact and symmetric without deficit  Respiratory: breathing non-labored at rest  Musculoskeletal: no edema or wounds, motor and sensation grossly intact, NT   Labs:  CBC Latest Ref Rng & Units 10/05/2018 10/04/2018 10/02/2018  WBC 4.0 - 10.5 K/uL 5.8 4.8 5.3  Hemoglobin 12.0 - 15.0 g/dL 9.5(L) 9.7(L) 10.1(L)  Hematocrit 36.0 - 46.0 % 28.8(L) 30.7(L) 32.9(L)  Platelets 150 - 400 K/uL 198 209 189   CMP Latest Ref Rng & Units 10/07/2018 10/05/2018 10/04/2018  Glucose 70 - 99 mg/dL 499(H) 97 159(H)  BUN 6 - 20 mg/dL 19 41(H) 38(H)  Creatinine 0.44 - 1.00 mg/dL 4.68(H) 10.03(H) 9.04(H)  Sodium 135 - 145 mmol/L 129(L) 135 135  Potassium 3.5 - 5.1 mmol/L 4.7 5.1 4.6  Chloride 98 - 111 mmol/L 89(L) 102 98  CO2 22 - 32 mmol/L 27 21(L) 23  Calcium 8.9 - 10.3 mg/dL 8.1(L) 7.8(L) 8.1(L)  Total  Protein 6.5 - 8.1 g/dL - - 7.5  Total Bilirubin 0.3 - 1.2 mg/dL - - 0.9  Alkaline Phos 38 - 126 U/L - - 112  AST 15 - 41 U/L - - 25  ALT 0 - 44 U/L - - 15   Assessment/Plan: (ICD-10's: R71) 58 y.o. female with concern for temporal arteritis s/p syncopal episode, complicated by comorbidities including DM, HTN, HLD, CAD s/p PCI for MI with chronic diastolic CHF and mitral regurgitation, ESRD on MWF HD, carotid artery stenoses, COPD, history of renal cell CA, peripheral edema, GERD, osteoarthritis, major depression disorder, and generalized anxiety disorder.   - Right temporal artery biopsy on hold pending medicine, cardiology, and vascular surgery clearances per anesthesia request.   - Will proceed with surgery once patient is cleared as described above which patient was understanding of.  - Okay to resume diet  - Medical management per primary team.   All of the above findings and recommendations were discussed with the patient, and the medical team, and all of patient's questions were answered to her expressed satisfaction.  -- Edison Simon, PA-C [facility] 10/07/2018, 8:48 AM [phone] M-F: 7am - 4pm

## 2018-10-08 ENCOUNTER — Encounter: Payer: Self-pay | Admitting: Anesthesiology

## 2018-10-08 ENCOUNTER — Encounter
Admission: EM | Disposition: A | Discharge status: Home / Self Care | Payer: Self-pay | Source: Home / Self Care | Attending: Internal Medicine

## 2018-10-08 LAB — GLUCOSE, CAPILLARY
Glucose-Capillary: 210 mg/dL — ABNORMAL HIGH (ref 70–99)
Glucose-Capillary: 54 mg/dL — ABNORMAL LOW (ref 70–99)
Glucose-Capillary: 58 mg/dL — ABNORMAL LOW (ref 70–99)
Glucose-Capillary: 61 mg/dL — ABNORMAL LOW (ref 70–99)
Glucose-Capillary: 55 mg/dL — ABNORMAL LOW (ref 70–99)
Glucose-Capillary: 64 mg/dL — ABNORMAL LOW (ref 70–99)
Glucose-Capillary: 68 mg/dL — ABNORMAL LOW (ref 70–99)
Glucose-Capillary: 68 mg/dL — ABNORMAL LOW (ref 70–99)
Glucose-Capillary: 104 mg/dL — ABNORMAL HIGH (ref 70–99)
Glucose-Capillary: 271 mg/dL — ABNORMAL HIGH (ref 70–99)

## 2018-10-08 LAB — PHOSPHORUS: Phosphorus: 3.1 mg/dL (ref 2.5–4.6)

## 2018-10-08 SURGERY — BIOPSY TEMPORAL ARTERY
Anesthesia: Choice | Laterality: Right

## 2018-10-08 MED ORDER — ACETAMINOPHEN 500 MG PO TABS: 1000.0000 mg | ORAL_TABLET | ORAL | Status: AC

## 2018-10-08 MED ORDER — CHLORHEXIDINE GLUCONATE CLOTH 2 % EX PADS
6.0000 | MEDICATED_PAD | Freq: Once | CUTANEOUS | Status: AC
  Administered 2018-10-08: 6 via TOPICAL

## 2018-10-08 MED ORDER — DEXTROSE 50 % IV SOLN
INTRAVENOUS | Status: AC
  Administered 2018-10-08: 50 mL via INTRAVENOUS_CENTRAL
  Filled 2018-10-08: qty 50

## 2018-10-08 MED ORDER — INSULIN GLARGINE 100 UNIT/ML ~~LOC~~ SOLN
20.0000 [IU] | Freq: Once | SUBCUTANEOUS | Status: AC
  Administered 2018-10-08: 20 [IU] via SUBCUTANEOUS
  Filled 2018-10-08: qty 0.2

## 2018-10-08 MED ORDER — CARVEDILOL 3.125 MG PO TABS
3.1250 mg | ORAL_TABLET | Freq: Two times a day (BID) | ORAL | Status: DC
  Administered 2018-10-08 – 2018-10-11 (×6): 3.125 mg via ORAL
  Filled 2018-10-08 (×6): qty 1

## 2018-10-08 SURGICAL SUPPLY — 30 items
ADHESIVE MASTISOL STRL (MISCELLANEOUS) ×2 IMPLANT
APPLIER CLIP 9.375 SM OPEN (CLIP)
BLADE SURG 15 STRL LF DISP TIS (BLADE) ×1 IMPLANT
BLADE SURG 15 STRL SS (BLADE) ×1
CANISTER SUCT 1200ML W/VALVE (MISCELLANEOUS) ×2 IMPLANT
CHLORAPREP W/TINT 26ML (MISCELLANEOUS) ×2 IMPLANT
CLIP APPLIE 9.375 SM OPEN (CLIP) IMPLANT
COVER WAND RF STERILE (DRAPES) ×2 IMPLANT
DERMABOND ADVANCED (GAUZE/BANDAGES/DRESSINGS)
DERMABOND ADVANCED .7 DNX12 (GAUZE/BANDAGES/DRESSINGS) IMPLANT
DRAPE LAPAROTOMY 77X122 PED (DRAPES) ×2 IMPLANT
ELECT REM PT RETURN 9FT ADLT (ELECTROSURGICAL) ×2
ELECTRODE REM PT RTRN 9FT ADLT (ELECTROSURGICAL) ×1 IMPLANT
GAUZE SPONGE 4X4 12PLY STRL (GAUZE/BANDAGES/DRESSINGS) ×2 IMPLANT
GLOVE BIO SURGEON STRL SZ8 (GLOVE) ×4 IMPLANT
GOWN STRL REUS W/ TWL LRG LVL3 (GOWN DISPOSABLE) ×2 IMPLANT
GOWN STRL REUS W/TWL LRG LVL3 (GOWN DISPOSABLE) ×2
KIT TURNOVER KIT A (KITS) ×2 IMPLANT
LABEL OR SOLS (LABEL) ×2 IMPLANT
NEEDLE HYPO 25X1 1.5 SAFETY (NEEDLE) ×2 IMPLANT
NS IRRIG 500ML POUR BTL (IV SOLUTION) ×2 IMPLANT
PACK BASIN MINOR ARMC (MISCELLANEOUS) ×2 IMPLANT
SPONGE LAP 18X18 RF (DISPOSABLE) ×2 IMPLANT
STRIP CLOSURE SKIN 1/2X4 (GAUZE/BANDAGES/DRESSINGS) ×2 IMPLANT
SUT MNCRL 4-0 (SUTURE) ×1
SUT MNCRL 4-0 27XMFL (SUTURE) ×1
SUT VIC AB 3-0 SH 27 (SUTURE) ×2
SUT VIC AB 3-0 SH 27X BRD (SUTURE) ×2 IMPLANT
SUTURE MNCRL 4-0 27XMF (SUTURE) ×1 IMPLANT
SYR 10ML LL (SYRINGE) ×2 IMPLANT

## 2018-10-08 NOTE — Progress Notes (Signed)
[facility]  ROUNDING NOTE   Subjective:  Patient seen at bedside. Due for temporal artery biopsy today. Also due for dialysis today.   Objective:  Vital signs in last 24 hours:  Temp:  [97.7 F (36.5 C)-98.7 F (37.1 C)] 98.7 F (37.1 C) (01/17 1110) Pulse Rate:  [75-79] 77 (01/17 1130) Resp:  [14-22] 22 (01/17 1130) BP: (141-168)/(51-69) 141/52 (01/17 1130) SpO2:  [98 %-100 %] 100 % (01/17 1110) FiO2 (%):  [28 %] 28 % (01/16 2048) Weight:  [87.1 kg-89.9 kg] 89.9 kg (01/17 1110)  Weight change: -0.2 kg Filed Weights   10/07/18 0544 10/08/18 0500 10/08/18 1110  Weight: 81.5 kg 87.1 kg 89.9 kg    Intake/Output: I/O last 3 completed shifts: In: 3 [I.V.:3] Out: 0    Intake/Output this shift:  No intake/output data recorded.  Physical Exam: General: No acute distress  Head: Normocephalic, atraumatic. Moist oral mucosal membranes  Eyes: Anicteric  Neck: Supple, trachea midline  Lungs:  Clear to auscultation, normal effort  Heart: S1S2 no rubs  Abdomen:  Soft, nontender, bowel sounds present  Extremities: Trace peripheral edema.  Neurologic: Awake, alert, following commands  Skin: No lesions  Access: AV graft    Basic Metabolic Panel: Recent Labs  Lab 10/02/18 1151 10/04/18 1140 10/05/18 0641 10/05/18 1241 10/06/18 0851 10/07/18 0753  NA 134* 135 135  --   --  129*  K 4.3 4.6 5.1  --   --  4.7  CL 97* 98 102  --   --  89*  CO2 24 23 21*  --   --  27  GLUCOSE 221* 159* 97  --   --  499*  BUN 21* 38* 41*  --   --  19  CREATININE 5.73* 9.04* 10.03*  --   --  4.68*  CALCIUM 8.4* 8.1* 7.8*  --   --  8.1*  PHOS  --   --   --  5.3* 4.5  --     Liver Function Tests: Recent Labs  Lab 10/02/18 1151 10/04/18 1140  AST 24 25  ALT 15 15  ALKPHOS 136* 112  BILITOT 0.8 0.9  PROT 8.3* 7.5  ALBUMIN 4.0 3.6   Recent Labs  Lab 10/02/18 1151 10/04/18 1140  LIPASE 45 83*   Recent Labs  Lab 10/04/18 1140  AMMONIA 17    CBC: Recent Labs   Lab 10/02/18 1151 10/04/18 1140 10/05/18 0641  WBC 5.3 4.8 5.8  NEUTROABS 3.6 2.6  --   HGB 10.1* 9.7* 9.5*  HCT 32.9* 30.7* 28.8*  MCV 94.5 93.6 94.7  PLT 189 209 198    Cardiac Enzymes: Recent Labs  Lab 10/04/18 1140 10/04/18 1906 10/05/18 0023 10/05/18 0641  TROPONINI <0.03 <0.03 <0.03 <0.03    BNP: Invalid input(s): POCBNP  CBG: Recent Labs  Lab 10/07/18 0732 10/07/18 1151 10/07/18 1656 10/07/18 2109 10/08/18 0809  GLUCAP 7* 71* 170* 59* 210*    Microbiology: Results for orders placed or performed during the hospital encounter of 10/04/18  MRSA PCR Screening     Status: None   Collection Time: 10/04/18  7:10 PM  Result Value Ref Range Status   MRSA by PCR NEGATIVE NEGATIVE Final    Comment:        The GeneXpert MRSA Assay (FDA approved for NASAL specimens only), is one component of a comprehensive MRSA colonization surveillance program. It is not intended to diagnose MRSA infection nor to guide or monitor treatment for MRSA infections.  Performed at [facility], [address] [place], [place] [address]     Coagulation Studies: No results for input(s): LABPROT, INR in the last 72 hours.  Urinalysis: No results for input(s): COLORURINE, LABSPEC, PHURINE, GLUCOSEU, HGBUR, BILIRUBINUR, KETONESUR, PROTEINUR, UROBILINOGEN, NITRITE, LEUKOCYTESUR in the last 72 hours.  Invalid input(s): APPERANCEUR    Imaging: No results found.   Medications:   . sodium chloride     . acetaminophen  1,000 mg Oral On Call to OR  . amLODipine  10 mg Oral Daily  . aspirin  81 mg Oral Daily  . atorvastatin  20 mg Oral QPM  . carvedilol  3.125 mg Oral BID WC  . Chlorhexidine Gluconate Cloth  6 each Topical Q0600  . cholecalciferol  400 Units Oral Daily  . epoetin (EPOGEN/PROCRIT) injection  10,000 Units Intravenous Q M,W,F-HD  . famotidine  20 mg Oral Daily  . ferrous sulfate  325 mg Oral Q breakfast  . FLUoxetine  20 mg Oral QHS  . heparin   5,000 Units Subcutaneous Q8H  . insulin aspart  0-20 Units Subcutaneous TID WC  . insulin aspart  0-5 Units Subcutaneous QHS  . ipratropium-albuterol  3 mL Nebulization BID  . lipase/protease/amylase  24,000 Units Oral Q lunch  . lipase/protease/amylase  36,000 Units Oral BID AC  . loratadine  10 mg Oral Daily  . meclizine  25 mg Oral BID  . methylPREDNISolone (SOLU-MEDROL) injection  80 mg Intravenous Daily  . multivitamin  1 tablet Oral Daily  . sevelamer carbonate  2,400 mg Oral TID WC  . sevelamer carbonate  800 mg Oral BID BM  . sodium chloride flush  3 mL Intravenous Q12H  . cyanocobalamin  1,000 mcg Oral Daily   sodium chloride, acetaminophen **OR** acetaminophen, albuterol, alum & mag hydroxide-simeth, guaiFENesin-dextromethorphan, HYDROcodone-acetaminophen, lidocaine, nitroGLYCERIN, ondansetron **OR** ondansetron (ZOFRAN) IV, promethazine, sodium chloride flush  Assessment/ Plan:  58 y.o. female with diabetes, hypertension, hyperlipidemia, diabetic retinopathy, diabetic neuropathy, diabetic gastroparesis, pancreatic insufficiency, depression, GERD. Renal masses, M-spike   MWF/ [facility] Payor, left arm AVG  1.  ESRD on HD MWF.  Patient due for dialysis today.  Orders have been prepared.  2.  Anemia chronic kidney disease.  Hemoglobin currently 9.5.  She has history of renal masses however we previously discussed with the patient to use of Epogen in this setting.   - Continue epogen with HD.   3.  Secondary hyperparathyroidism.  Repeat serum phosphorus today.  Otherwise maintain the patient on Renvela 3 tablets p.o. 3 times daily with meals and 1 tablet p.o. twice daily with snacks.  4.  Renal masses.  Followed by Dr. Erlene Quan.  CT scan was performed on 10/02/18.  5.  Temporal pain: pt going for temporal artery biopsy today.    LOS: 2 Glema Takaki 1/17/202011:39 AM

## 2018-10-08 NOTE — Progress Notes (Signed)
Post dialysis assessment 

## 2018-10-08 NOTE — Progress Notes (Signed)
[place] [facility] [place] NAME: [name]    MR#:  [id number]  DATE OF BIRTH:  August 17, 1961  SUBJECTIVE:  CHIEF COMPLAINT:   Chief Complaint  Patient presents with  . Altered Mental Status   NPO for procedure later today Continues to have cough and congestion in chest.  On 2 L oxygen.  REVIEW OF SYSTEMS:    Review of Systems  Constitutional: Positive for malaise/fatigue. Negative for chills and fever.  HENT: Positive for sinus pain. Negative for sore throat.   Eyes: Negative for blurred vision, double vision and pain.  Respiratory: Positive for cough and wheezing. Negative for hemoptysis and shortness of breath.   Cardiovascular: Negative for chest pain, palpitations, orthopnea and leg swelling.  Gastrointestinal: Negative for abdominal pain, constipation, diarrhea, heartburn, nausea and vomiting.  Genitourinary: Negative for dysuria and hematuria.  Musculoskeletal: Negative for back pain and joint pain.  Skin: Negative for rash.  Neurological: Negative for sensory change, speech change, focal weakness and headaches.  Endo/Heme/Allergies: Does not bruise/bleed easily.  Psychiatric/Behavioral: Negative for depression. The patient is not nervous/anxious.     DRUG ALLERGIES:   Allergies  Allergen Reactions  . Ace Inhibitors Swelling and Anaphylaxis  . Ativan [Lorazepam] Other (See Comments)    Reaction:Hallucinations and headaches  . Compazine [Prochlorperazine Edisylate] Anaphylaxis, Nausea And Vomiting and Other (See Comments)    Other reaction(s): dystonia from this vs. Reglan, 23 Jul - patient relates that she takes promethazine frequently with no problems  . Sumatriptan Succinate Other (See Comments)    Other reaction(s): delirium and hallucinations per [facility] records  . Dilaudid [Hydromorphone Hcl] Other (See Comments)    Delirium   . Ondansetron Other (See Comments)    hallucinations    . Zofran [Ondansetron Hcl] Other (See  Comments)    Reaction:  hallucinations   . Codeine Nausea And Vomiting  . Gabapentin Other (See Comments)    Reaction:  Unknown    . Lac Bovis Nausea And Vomiting  . Losartan Nausea Only  . Oxycodone Anxiety  . Prochlorperazine Other (See Comments)    Reaction:  Unknown . Patient does not remember reaction but she does have vertigo and anxiety along with n and v at times. Could be used to treat any of these   . Reglan [Metoclopramide] Other (See Comments)    Per patient her Dr. Evelina Bucy her off it   . Scopolamine Other (See Comments)    Dizziness, also has vertigo already  . Tape Rash  . Tapentadol Rash    VITALS:  Blood pressure (!) 155/45, pulse 80, temperature 98.6 F (37 C), temperature source Oral, resp. rate 19, height 5\' 5"  (1.651 m), weight 89.9 kg, SpO2 100 %.  PHYSICAL EXAMINATION:   Physical Exam  GENERAL:  58 y.o.-year-old patient lying in the bed. EYES: Pupils equal, round, reactive to light and accommodation. No scleral icterus. Extraocular muscles intact.  HEENT: Head atraumatic, normocephalic. Oropharynx and nasopharynx clear.  Bilateral temporal tenderness NECK:  Supple, no jugular venous distention. No thyroid enlargement, no tenderness.  LUNGS: Bilateral wheezing.  Good air entry CARDIOVASCULAR: S1, S2 normal. No murmurs, rubs, or gallops.  ABDOMEN: Soft, nontender, nondistended. Bowel sounds present. No organomegaly or mass.  EXTREMITIES: No cyanosis, clubbing or edema b/l.    NEUROLOGIC: Cranial nerves II through XII are intact. No focal Motor or sensory deficits b/l.   PSYCHIATRIC: The patient is alert and oriented x 3.  SKIN: No obvious rash, lesion, or  ulcer.   LABORATORY PANEL:   CBC Recent Labs  Lab 10/05/18 0641  WBC 5.8  HGB 9.5*  HCT 28.8*  PLT 198   ------------------------------------------------------------------------------------------------------------------ Chemistries  Recent Labs  Lab 10/04/18 1140  10/07/18 0753  NA 135   < >  129*  K 4.6   < > 4.7  CL 98   < > 89*  CO2 23   < > 27  GLUCOSE 159*   < > 499*  BUN 38*   < > 19  CREATININE 9.04*   < > 4.68*  CALCIUM 8.1*   < > 8.1*  AST 25  --   --   ALT 15  --   --   ALKPHOS 112  --   --   BILITOT 0.9  --   --    < > = values in this interval not displayed.   ------------------------------------------------------------------------------------------------------------------  Cardiac Enzymes Recent Labs  Lab 10/05/18 0641  TROPONINI <0.03   ------------------------------------------------------------------------------------------------------------------  RADIOLOGY:  No results found.   ASSESSMENT AND PLAN:   *Uncontrolled diabetes mellitus with hyperglycemia.  Have given her 1 more dose of Lantus today.  Can re-dose tomorrow if continues to be elevated.  Sliding scale insulin.  Uncontrolled due to steroids.  *Right-sided headache.  Discussed with neurology team.  Concern regarding temporal arteritis.  IV Solu-Medrol started.  Biopsy planned by surgery later today  * Syncope vs seizure  Consulted neurology  No seizure medications at this time  Telemetry monitoring. Echocardiogram done and nothing acute troponin normal. EEG checked and normal.  * Carotid stenosis.  Could be playing a part in her symptoms.  Vascular surgery consulted, advised outpatient follow-up  * COPD exacerbation with acute hypoxic respiratory failure.  On steroids and scheduled nebulizers. Oxygen as needed.  * Dizziness.  Likely labyrinthitis from her sinus congestion and bronchitis. Added meclizine.  Improving  * End-stage renal disease on hemodialysis.  Hemodialysis done today  Likely discharge tomorrow  All the records are reviewed and case discussed with Care Management/Social Worker Management plans discussed with the patient, family and they are in agreement.  CODE STATUS:FULL CODE  DVT Prophylaxis: SCDs  TOTAL TIME TAKING CARE OF THIS PATIENT: 35 minutes.    POSSIBLE D/C IN 1-2 DAYS, DEPENDING ON CLINICAL CONDITION.  Leia Alf Toula Miyasaki M.D on 10/08/2018 at 3:50 PM  Between 7am to 6pm - Pager - [phone]  After 6pm go to [email or web address] - password [email or web address] [facility]  [facility]  Office  [phone]  CC: Primary care physician; Freddy Finner, NP  Note: This dictation was prepared with Dragon dictation along with smaller phrase technology. Any transcriptional errors that result from this process are unintentional.

## 2018-10-08 NOTE — Progress Notes (Signed)
Subjective: Patient feels better, stated that her headaches are gone, for right temporal artery biopsy today. Objective: Current vital signs: BP (!) 168/69 (BP Location: Right Arm)   Pulse 77   Temp 98.2 F (36.8 C) (Oral)   Resp 20   Ht 5\' 5"  (1.651 m)   Wt 87.1 kg   SpO2 100%   BMI 31.95 kg/m  Vital signs in last 24 hours: Temp:  [97.7 F (36.5 C)-98.5 F (36.9 C)] 98.2 F (36.8 C) (01/17 0808) Pulse Rate:  [75-79] 77 (01/17 0808) Resp:  [17-20] 20 (01/17 0808) BP: (142-168)/(52-69) 168/69 (01/17 0808) SpO2:  [98 %-100 %] 100 % (01/17 0808) FiO2 (%):  [28 %] 28 % (01/16 2048) Weight:  [87.1 kg] 87.1 kg (01/17 0500)  Intake/Output from previous day: No intake/output data recorded. Intake/Output this shift: No intake/output data recorded. Nutritional status:  Diet Order            Diet NPO time specified  Diet effective midnight             Neurological Exam  Mental Status: Alert, oriented, thought content appropriate. Speech fluent without evidence of aphasia. Able to follow 3 step commands without difficulty. Attention span and concentration seemed appropriate  Cranial Nerves: II: Discs flat bilaterally; Visual fields grossly normal, pupils equal, round, reactive to light and accommodation III,IV, VI: ptosis not present, extra-ocular motions intact bilaterally V,VII: smile symmetric, facial light touch sensationintact VIII: hearing normal bilaterally IX,X: gag reflex present XI: bilateral shoulder shrug XII: midline tongue extension Motor: Right :Upper extremity 5/5Without pronator driftLeft: Upper extremity 5/5 without pronator drift Right:Lower extremity 5/5Left: Lower extremity 5/5 Tone and bulk:normal tone throughout; no atrophy noted Sensory: Pinprick and light touchintact bilaterally Deep Tendon Reflexes: 2+ and symmetric  throughout Plantars: Right:muteLeft: mute Cerebellar: Finger-to-nosetesting intact bilaterally.Heel to shin testing normal bilaterally Gait: not tested due to safety concerns  Data Reviewed  Lab Results: Basic Metabolic Panel: Recent Labs  Lab 10/02/18 1151 10/04/18 1140 10/05/18 0641 10/05/18 1241 10/06/18 0851 10/07/18 0753  NA 134* 135 135  --   --  129*  K 4.3 4.6 5.1  --   --  4.7  CL 97* 98 102  --   --  89*  CO2 24 23 21*  --   --  27  GLUCOSE 221* 159* 97  --   --  499*  BUN 21* 38* 41*  --   --  19  CREATININE 5.73* 9.04* 10.03*  --   --  4.68*  CALCIUM 8.4* 8.1* 7.8*  --   --  8.1*  PHOS  --   --   --  5.3* 4.5  --     Liver Function Tests: Recent Labs  Lab 10/02/18 1151 10/04/18 1140  AST 24 25  ALT 15 15  ALKPHOS 136* 112  BILITOT 0.8 0.9  PROT 8.3* 7.5  ALBUMIN 4.0 3.6   Recent Labs  Lab 10/02/18 1151 10/04/18 1140  LIPASE 45 83*   Recent Labs  Lab 10/04/18 1140  AMMONIA 17    CBC: Recent Labs  Lab 10/02/18 1151 10/04/18 1140 10/05/18 0641  WBC 5.3 4.8 5.8  NEUTROABS 3.6 2.6  --   HGB 10.1* 9.7* 9.5*  HCT 32.9* 30.7* 28.8*  MCV 94.5 93.6 94.7  PLT 189 209 198    Cardiac Enzymes: Recent Labs  Lab 10/04/18 1140 10/04/18 1906 10/05/18 0023 10/05/18 0641  TROPONINI <0.03 <0.03 <0.03 <0.03    Lipid Panel: No results for  input(s): CHOL, TRIG, HDL, CHOLHDL, VLDL, LDLCALC in the last 168 hours.  CBG: Recent Labs  Lab 10/07/18 0732 10/07/18 1151 10/07/18 1656 10/07/18 2109 10/08/18 0809  GLUCAP 469* 285* 170* 219* 210*    Microbiology: Results for orders placed or performed during the hospital encounter of 10/04/18  MRSA PCR Screening     Status: None   Collection Time: 10/04/18  7:10 PM  Result Value Ref Range Status   MRSA by PCR NEGATIVE NEGATIVE Final    Comment:        The GeneXpert MRSA Assay (FDA approved for NASAL specimens only), is one component of a comprehensive MRSA  colonization surveillance program. It is not intended to diagnose MRSA infection nor to guide or monitor treatment for MRSA infections. Performed at [facility], [address] [place], [place] [address]     Coagulation Studies: No results for input(s): LABPROT, INR in the last 72 hours.  Imaging: No results found.  Medications:  I have reviewed the patient's current medications. Prior to Admission:  Medications Prior to Admission  Medication Sig Dispense Refill Last Dose  . albuterol (PROVENTIL HFA;VENTOLIN HFA) 108 (90 Base) MCG/ACT inhaler Inhale 2 puffs into the lungs every 6 (six) hours as needed for wheezing or shortness of breath. 1 Inhaler 2 prn at prn  . albuterol (PROVENTIL) (2.5 MG/3ML) 0.083% nebulizer solution Take 2.5 mg by nebulization every 4 (four) hours as needed for wheezing or shortness of breath.   unknown at unknown  . amLODipine (NORVASC) 10 MG tablet Take 10 mg by mouth daily.   unknown at unknown  . aspirin EC 81 MG tablet Take 81 mg by mouth daily.   unknown at unknown  . atorvastatin (LIPITOR) 20 MG tablet Take 20 mg by mouth every evening.    unknown at unknown  . Biotin 1 MG CAPS Take 1 mg by mouth daily.   unknown at unknown  . cetirizine (ZYRTEC) 10 MG tablet Take 10 mg by mouth daily.    unknown at unknown  . cholecalciferol (VITAMIN D) 400 units TABS tablet Take 400 Units by mouth daily.   unknown at unknown  . cyanocobalamin 1000 MCG tablet Take 1,000 mcg by mouth daily.    unknown at unknown  . diclofenac sodium (VOLTAREN) 1 % GEL Apply 1 application topically daily as needed (for rash).   unknown at unknown  . ferrous sulfate ([employer] IRON) 325 (65 FE) MG tablet Take 325 mg by mouth daily with breakfast.   unknown at unknown  . FLUoxetine (PROZAC) 20 MG capsule Take 1 capsule (20 mg total) by mouth daily. (Patient taking differently: Take 20 mg by mouth at bedtime. ) 30 capsule 0 unknown at unknown  . hydrocortisone cream 0.5 % Apply  topically 3 (three) times daily. 30 g 0 unknown at unknown  . insulin aspart (NOVOLOG FLEXPEN) 100 UNIT/ML FlexPen Inject 4-15 Units into the skin 4 (four) times daily as needed for high blood sugar (> 100, per sliding scale).   unknown at unknown  . lidocaine (LIDODERM) 5 % Place 1 patch onto the skin every 12 (twelve) hours. Remove & Discard patch within 12 hours or as directed by MD 10 patch 0 unknown at unknown  . lipase/protease/amylase (CREON) 12000 UNITS CPEP capsule Take 36000 mg in the morning, 24000 midday & 36000 in the evening   unknown at unknown  . LORazepam (ATIVAN PO) Take by mouth.   unknown at unknown  . multivitamin (RENA-VIT) TABS tablet Take  1 tablet by mouth daily.    unknown at unknown  . nitroGLYCERIN (NITROSTAT) 0.4 MG SL tablet Place 0.4 mg under the tongue every 5 (five) minutes as needed for chest pain.    prn at prn  . ranitidine (ZANTAC) 300 MG capsule Take 1 capsule by mouth every evening.  1 unknown at unknown  . sevelamer carbonate (RENVELA) 800 MG tablet Take 800-2,400 mg by mouth See admin instructions. Take 2400 mg by mouth 3 times daily with meals and take 800 mg by mouth with snacks   unknown at unknown   Scheduled: . acetaminophen  1,000 mg Oral On Call to OR  . amLODipine  10 mg Oral Daily  . aspirin  81 mg Oral Daily  . atorvastatin  20 mg Oral QPM  . carvedilol  3.125 mg Oral BID WC  . Chlorhexidine Gluconate Cloth  6 each Topical Q0600  . cholecalciferol  400 Units Oral Daily  . epoetin (EPOGEN/PROCRIT) injection  10,000 Units Intravenous Q M,W,F-HD  . famotidine  20 mg Oral Daily  . ferrous sulfate  325 mg Oral Q breakfast  . FLUoxetine  20 mg Oral QHS  . heparin  5,000 Units Subcutaneous Q8H  . insulin aspart  0-20 Units Subcutaneous TID WC  . insulin aspart  0-5 Units Subcutaneous QHS  . ipratropium-albuterol  3 mL Nebulization BID  . lipase/protease/amylase  24,000 Units Oral Q lunch  . lipase/protease/amylase  36,000 Units Oral BID AC  .  loratadine  10 mg Oral Daily  . meclizine  25 mg Oral BID  . methylPREDNISolone (SOLU-MEDROL) injection  80 mg Intravenous Daily  . multivitamin  1 tablet Oral Daily  . sevelamer carbonate  2,400 mg Oral TID WC  . sevelamer carbonate  800 mg Oral BID BM  . sodium chloride flush  3 mL Intravenous Q12H  . cyanocobalamin  1,000 mcg Oral Daily   Assessment: 58 y.o female with multiple medical issues including ESRD on dialysis M-W-F, diabetes mellitus, COPD, CAD status post stent, diabetic neuropathy, diverticulitis, dizziness, chronic headaches, pancreatitis, hypertension, depression, hyperlipidemia, and CHF presenting to the ED on 10/04/2018 with syncopal episode. Presentation initially concerning for vertebrobasilar insufficiency, seizures, vasovagal syncope, and orthostatic hypotension, carotid sinus syncope,  TIA or CVA (although LOC requires significant CNS involvement, for which resolution would be delayed). CTA head and neck showed no intracranial large vessel occlusion, significant stenosis or posterior circulation pathology.  CT head also reviewed and shows no acute intracranial abnormality to explain symptoms.  EEG was normal with no epileptiform discharges noted.  Consult possible temporal arteritis.  Pending biopsy.  Patient continues complains of right temporal area pain and tenderness.  IV steroids started with patient reporting slight improvement.    Plan: 1.  Continue Solumedrol 80 mg IV daily pending temporal artery biopsy today 2.  Agree with current pain management.      LOS: 2 days   10/08/2018  10:47 AM

## 2018-10-08 NOTE — Progress Notes (Addendum)
SURGICAL PROGRESS NOTE (cpt: [phone])  Patient seen and examined as described below by surgical PA-C, Ardell Isaacs.  Assessment/Plan: (ICD-10's: R51) 58 y.o.femalewith concern for temporal arteritis considering persistent Right-sided headache and jaw pain while chewing s/p syncopal episode, complicated by comorbidities includingDM, HTN, HLD, CAD s/p PCI for MI with chronic diastolic CHF and mitral regurgitation, ESRD on MWF HD, carotid artery stenoses, COPD, history of renal cell CA, peripheral edema, GERD, osteoarthritis, major depression disorder, and generalized anxiety disorder.   - medicine, cardiology, and vascular surgery notes per anesthesia request appreciated             - Right temporal artery biopsy again cancelled by anesthesia today after had been scheduled, this time stating patient can't undergo procedure until at least 3 hours following full hemodialysis and that no OR's would by that time be available for non-emergent surgeries which likewise cannot be done over weeken             - considering barriers to performing patient's Right temporal artery biopsy, even with only local anesthetic, in OR during her current admission, primary medical team planning to discharge home tomorrow  - outpatient surgical follow-up appointment made for this Tuesday and may consider in-office temporal artery biopsy as soon as possible, likewise with local anesthetic, if procedure in OR is not feasible and no minor procedure room, particularly considering increased false negative biopsy risk with ongoing steroids             - NPO order discontinued and patient's diet was resumed accordingly             - medical management as per primary team  I have personally reviewed the patient's chart, evaluated/examined the patient, proposed the recommended management, and discussed these recommendations with the patientto her expressed satisfaction as well as with patient's RN and medical  physician.  Thank you for the opportunity to participate in this patient's care.  -- Marilynne Drivers [name], MD, [place]: [facility] - Partnering for exceptional care. Office: [facility] SURGICAL PROGRESS NOTE (cpt [phone])  Hospital Day(s): 2.   Post op day(s): 1 Day Post-Op.   Interval History: Patient seen and examined, no acute events or new complaints overnight. Patient reports that she had a right sided headache overnight but this resolved. She denies fever, chills, vision changes, nausea, or emesis. She is scheduled for dialysis today. Cleared by cardiology and vascular surgery yesterday.   Review of Systems:  Constitutional: denies fever, chills  Gastrointestinal: denies N/V, or diarrhea/and bowel function as per interval history Musculoskeletal: denies pain, decreased motor or sensation Neurological: + HA, denied vision/hearing changes   Vital signs in last 24 hours: [min-max] current  Temp:  [97.7 F (36.5 C)-98.7 F (37.1 C)] 98.7 F (37.1 C) (01/17 1110) Pulse Rate:  [69-79] 73 (01/17 1300) Resp:  [14-26] 18 (01/17 1300) BP: (89-168)/(45-69) 132/53 (01/17 1300) SpO2:  [98 %-100 %] 100 % (01/17 1110) FiO2 (%):  [28 %] 28 % (01/16 2048) Weight:  [87.1 kg-89.9 kg] 89.9 kg (01/17 1110)     Height: 5\' 5"  (165.1 cm) Weight: 89.9 kg BMI (Calculated): 32.98   Intake/Output this shift:  No intake/output data recorded.   Intake/Output last 2 shifts:  @IOLAST2SHIFTS @   Physical Exam:  Constitutional: alert, cooperative and no distress  HENT: normocephalic without obvious abnormality  Eyes: PERRL, EOM's grossly intact and symmetric  Neuro: CN II - XII grossly intact and symmetric without deficit  Respiratory: breathing non-labored at rest  Musculoskeletal: no edema or wounds, motor and sensation grossly intact, NT    Labs:  CBC Latest Ref Rng & Units 10/05/2018 10/04/2018 10/02/2018  WBC 4.0 - 10.5  K/uL 5.8 4.8 5.3  Hemoglobin 12.0 - 15.0 g/dL 9.5(L) 9.7(L) 10.1(L)  Hematocrit 36.0 - 46.0 % 28.8(L) 30.7(L) 32.9(L)  Platelets 150 - 400 K/uL 198 209 189   CMP Latest Ref Rng & Units 10/07/2018 10/05/2018 10/04/2018  Glucose 70 - 99 mg/dL 499(H) 97 159(H)  BUN 6 - 20 mg/dL 19 41(H) 38(H)  Creatinine 0.44 - 1.00 mg/dL 4.68(H) 10.03(H) 9.04(H)  Sodium 135 - 145 mmol/L 129(L) 135 135  Potassium 3.5 - 5.1 mmol/L 4.7 5.1 4.6  Chloride 98 - 111 mmol/L 89(L) 102 98  CO2 22 - 32 mmol/L 27 21(L) 23  Calcium 8.9 - 10.3 mg/dL 8.1(L) 7.8(L) 8.1(L)  Total Protein 6.5 - 8.1 g/dL - - 7.5  Total Bilirubin 0.3 - 1.2 mg/dL - - 0.9  Alkaline Phos 38 - 126 U/L - - 112  AST 15 - 41 U/L - - 25  ALT 0 - 44 U/L - - 15     Assessment/Plan: (ICD-10's: R73) 58 y.o. female with with concern for temporal arteritis s/p syncopal episode, complicated by comorbidities includingDM, HTN, HLD, CAD s/p PCI for MI with chronic diastolic CHF and mitral regurgitation, ESRD on MWF HD, carotid artery stenoses, COPD, history of renal cell CA, peripheral edema, GERD, osteoarthritis, major depression disorder, and generalized anxiety disorder.   - Right temporal artery biopsy scheduled for today however this is being cancelled given the patients need for dialysis today after Dr [name] discussed with OR/Anesthesia.   - If the patient is still in the hospital on Monday, Dr [name] will try and complete right temporal artery biopsy  - If the patient can be discharged, she can follow up with [place] as an outpatient for completion of biopsy.   - Continue medical management otherwise  - Okay for diet.    All of the above findings and recommendations were discussed with the patient, and the medical team, and all of patient's questions were answered to her expressed satisfaction.  -- Edison Simon, PA-C [facility] 10/08/2018, 1:08 PM [phone] M-F: 7am - 4pm

## 2018-10-08 NOTE — Progress Notes (Signed)
Hd started  

## 2018-10-08 NOTE — Progress Notes (Signed)
Inpatient Diabetes Program Recommendations  AACE/ADA: New Consensus Statement on Inpatient Glycemic Control   Target Ranges:  Prepandial:   less than 140 mg/dL      Peak postprandial:   less than 180 mg/dL (1-2 hours)      Critically ill patients:  140 - 180 mg/dL   Results for [name], [name] (MRN [id number]) as of 10/08/2018 10:28  Ref. Range 10/07/2018 07:32 10/07/2018 11:51 10/07/2018 16:56 10/07/2018 21:09 10/08/2018 08:09  Glucose-Capillary Latest Ref Range: 70 - 99 mg/dL 469 (H) 285 (H) 170 (H) 219 (H) 210 (H)  Results for [name], [name] (MRN [id number]) as of 10/08/2018 10:28  Ref. Range 10/07/2018 07:53  Hemoglobin A1C Latest Ref Range: 4.8 - 5.6 % 7.8 (H)   Review of Glycemic Control  Diabetes history: DM2 Outpatient Diabetes medications: Novolog 4-15 units QID Current orders for Inpatient glycemic control: Novolog 0-20 units TID with meals, Novolog 0-5 units QHS; Solumedrol 80 mg daily  Inpatient Diabetes Program Recommendations:   Insulin - Basal: Noted Lantus 20 units x 1 given on 10/07/18 and 10/08/18. Agree with adding basal insulin on a daily basis if needed while ordered steroids.  Insulin - Meal Coverage: If steroids are continued, please consider ordering Novolog 4 units TID with meals for meal coverage if patient eats at least 50% of meals.  Thanks, Barnie Alderman, RN, MSN, CDE Diabetes Coordinator Inpatient Diabetes Program [phone] (Team Pager from 8am to 5pm)

## 2018-10-08 NOTE — Care Management Important Message (Signed)
Copy of signed Medicare IM left in patient's room (out for dialysis).

## 2018-10-08 NOTE — Progress Notes (Signed)
Hd completed 

## 2018-10-08 NOTE — Progress Notes (Signed)
[name] MRN: [id number] DOB: 08-28-1961   Cancelled Treatment:    Reason Eval/Treat Not Completed: Other (comment)(Upon entering room, pt asleep, easily woken. Requested for PT to try later since she was trying to rest before her procedure. PT will follow up as able.)   Lieutenant Diego PT, DPT 9:57 AM,10/08/18 [phone]

## 2018-10-08 NOTE — H&P (View-Only) (Signed)
SURGICAL PROGRESS NOTE (cpt: [phone])  Patient seen and examined as described below by surgical PA-C, Ardell Isaacs.  Assessment/Plan: (ICD-10's: R51) 58 y.o.femalewith concern for temporal arteritis considering persistent Right-sided headache and jaw pain while chewing s/p syncopal episode, complicated by comorbidities includingDM, HTN, HLD, CAD s/p PCI for MI with chronic diastolic CHF and mitral regurgitation, ESRD on MWF HD, carotid artery stenoses, COPD, history of renal cell CA, peripheral edema, GERD, osteoarthritis, major depression disorder, and generalized anxiety disorder.   - medicine, cardiology, and vascular surgery notes per anesthesia request appreciated             - Right temporal artery biopsy again cancelled by anesthesia today after had been scheduled, this time stating patient can't undergo procedure until at least 3 hours following full hemodialysis and that no OR's would by that time be available for non-emergent surgeries which likewise cannot be done over weeken             - considering barriers to performing patient's Right temporal artery biopsy, even with only local anesthetic, in OR during her current admission, primary medical team planning to discharge home tomorrow  - outpatient surgical follow-up appointment made for this Tuesday and may consider in-office temporal artery biopsy as soon as possible, likewise with local anesthetic, if procedure in OR is not feasible and no minor procedure room, particularly considering increased false negative biopsy risk with ongoing steroids             - NPO order discontinued and patient's diet was resumed accordingly             - medical management as per primary team  I have personally reviewed the patient's chart, evaluated/examined the patient, proposed the recommended management, and discussed these recommendations with the patientto her expressed satisfaction as well as with patient's RN and medical  physician.  Thank you for the opportunity to participate in this patient's care.  -- Marilynne Drivers [name], MD, [place]: [facility] - Partnering for exceptional care. Office: [facility] SURGICAL PROGRESS NOTE (cpt [phone])  Hospital Day(s): 2.   Post op day(s): 1 Day Post-Op.   Interval History: Patient seen and examined, no acute events or new complaints overnight. Patient reports that she had a right sided headache overnight but this resolved. She denies fever, chills, vision changes, nausea, or emesis. She is scheduled for dialysis today. Cleared by cardiology and vascular surgery yesterday.   Review of Systems:  Constitutional: denies fever, chills  Gastrointestinal: denies N/V, or diarrhea/and bowel function as per interval history Musculoskeletal: denies pain, decreased motor or sensation Neurological: + HA, denied vision/hearing changes   Vital signs in last 24 hours: [min-max] current  Temp:  [97.7 F (36.5 C)-98.7 F (37.1 C)] 98.7 F (37.1 C) (01/17 1110) Pulse Rate:  [69-79] 73 (01/17 1300) Resp:  [14-26] 18 (01/17 1300) BP: (89-168)/(45-69) 132/53 (01/17 1300) SpO2:  [98 %-100 %] 100 % (01/17 1110) FiO2 (%):  [28 %] 28 % (01/16 2048) Weight:  [87.1 kg-89.9 kg] 89.9 kg (01/17 1110)     Height: 5\' 5"  (165.1 cm) Weight: 89.9 kg BMI (Calculated): 32.98   Intake/Output this shift:  No intake/output data recorded.   Intake/Output last 2 shifts:  @IOLAST2SHIFTS @   Physical Exam:  Constitutional: alert, cooperative and no distress  HENT: normocephalic without obvious abnormality  Eyes: PERRL, EOM's grossly intact and symmetric  Neuro: CN II - XII grossly intact and symmetric without deficit  Respiratory: breathing non-labored at rest  Musculoskeletal: no edema or wounds, motor and sensation grossly intact, NT    Labs:  CBC Latest Ref Rng & Units 10/05/2018 10/04/2018 10/02/2018  WBC 4.0 - 10.5  K/uL 5.8 4.8 5.3  Hemoglobin 12.0 - 15.0 g/dL 9.5(L) 9.7(L) 10.1(L)  Hematocrit 36.0 - 46.0 % 28.8(L) 30.7(L) 32.9(L)  Platelets 150 - 400 K/uL 198 209 189   CMP Latest Ref Rng & Units 10/07/2018 10/05/2018 10/04/2018  Glucose 70 - 99 mg/dL 499(H) 97 159(H)  BUN 6 - 20 mg/dL 19 41(H) 38(H)  Creatinine 0.44 - 1.00 mg/dL 4.68(H) 10.03(H) 9.04(H)  Sodium 135 - 145 mmol/L 129(L) 135 135  Potassium 3.5 - 5.1 mmol/L 4.7 5.1 4.6  Chloride 98 - 111 mmol/L 89(L) 102 98  CO2 22 - 32 mmol/L 27 21(L) 23  Calcium 8.9 - 10.3 mg/dL 8.1(L) 7.8(L) 8.1(L)  Total Protein 6.5 - 8.1 g/dL - - 7.5  Total Bilirubin 0.3 - 1.2 mg/dL - - 0.9  Alkaline Phos 38 - 126 U/L - - 112  AST 15 - 41 U/L - - 25  ALT 0 - 44 U/L - - 15     Assessment/Plan: (ICD-10's: R48) 58 y.o. female with with concern for temporal arteritis s/p syncopal episode, complicated by comorbidities includingDM, HTN, HLD, CAD s/p PCI for MI with chronic diastolic CHF and mitral regurgitation, ESRD on MWF HD, carotid artery stenoses, COPD, history of renal cell CA, peripheral edema, GERD, osteoarthritis, major depression disorder, and generalized anxiety disorder.   - Right temporal artery biopsy scheduled for today however this is being cancelled given the patients need for dialysis today after Dr [name] discussed with OR/Anesthesia.   - If the patient is still in the hospital on Monday, Dr [name] will try and complete right temporal artery biopsy  - If the patient can be discharged, she can follow up with [place] as an outpatient for completion of biopsy.   - Continue medical management otherwise  - Okay for diet.    All of the above findings and recommendations were discussed with the patient, and the medical team, and all of patient's questions were answered to her expressed satisfaction.  -- Edison Simon, PA-C [facility] 10/08/2018, 1:08 PM [phone] M-F: 7am - 4pm

## 2018-10-08 NOTE — Progress Notes (Signed)
[name] MRN: [id number] DOB: [birth year]   Cancelled Treatment:    Reason Eval/Treat Not Completed: Other (comment);Patient at procedure or test/unavailable(Pt out of room at this time, PT will follow up as able.)   Lieutenant Diego PT, DPT 2:59 PM,10/08/18 [phone]

## 2018-10-09 ENCOUNTER — Inpatient Hospital Stay: Payer: Medicare Other

## 2018-10-09 LAB — GLUCOSE, CAPILLARY
Glucose-Capillary: 69 mg/dL — ABNORMAL LOW (ref 70–99)
Glucose-Capillary: 57 mg/dL — ABNORMAL LOW (ref 70–99)
Glucose-Capillary: 201 mg/dL — ABNORMAL HIGH (ref 70–99)
Glucose-Capillary: 271 mg/dL — ABNORMAL HIGH (ref 70–99)
Glucose-Capillary: 264 mg/dL — ABNORMAL HIGH (ref 70–99)

## 2018-10-09 IMAGING — CR DG WRIST COMPLETE 3+V*L*
1 series · 4 of 4 positions shown · non-contrast
Comparison: CT wrist 07/14/2017 radiographs 02/19/2017

CLINICAL DATA: Pain and swelling at LEFT wrist, prior fracture

EXAM:
LEFT WRIST - COMPLETE 3+ VIEW

[Series 1: dg wrist complete left · 0.14mm/px · 4 of 4 slices shown]
[im 1/4]
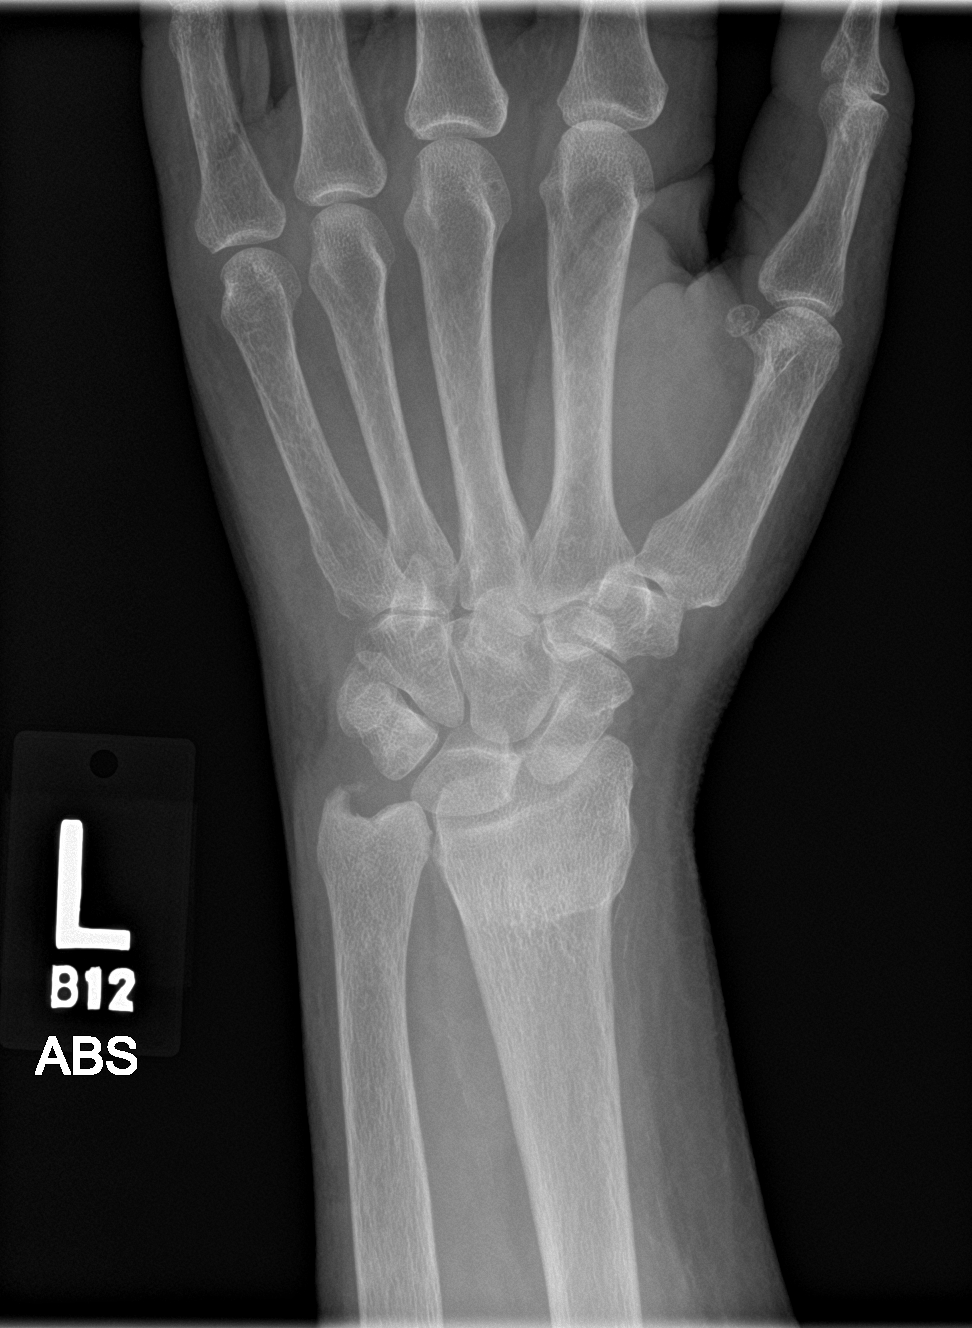
[im 2/4]
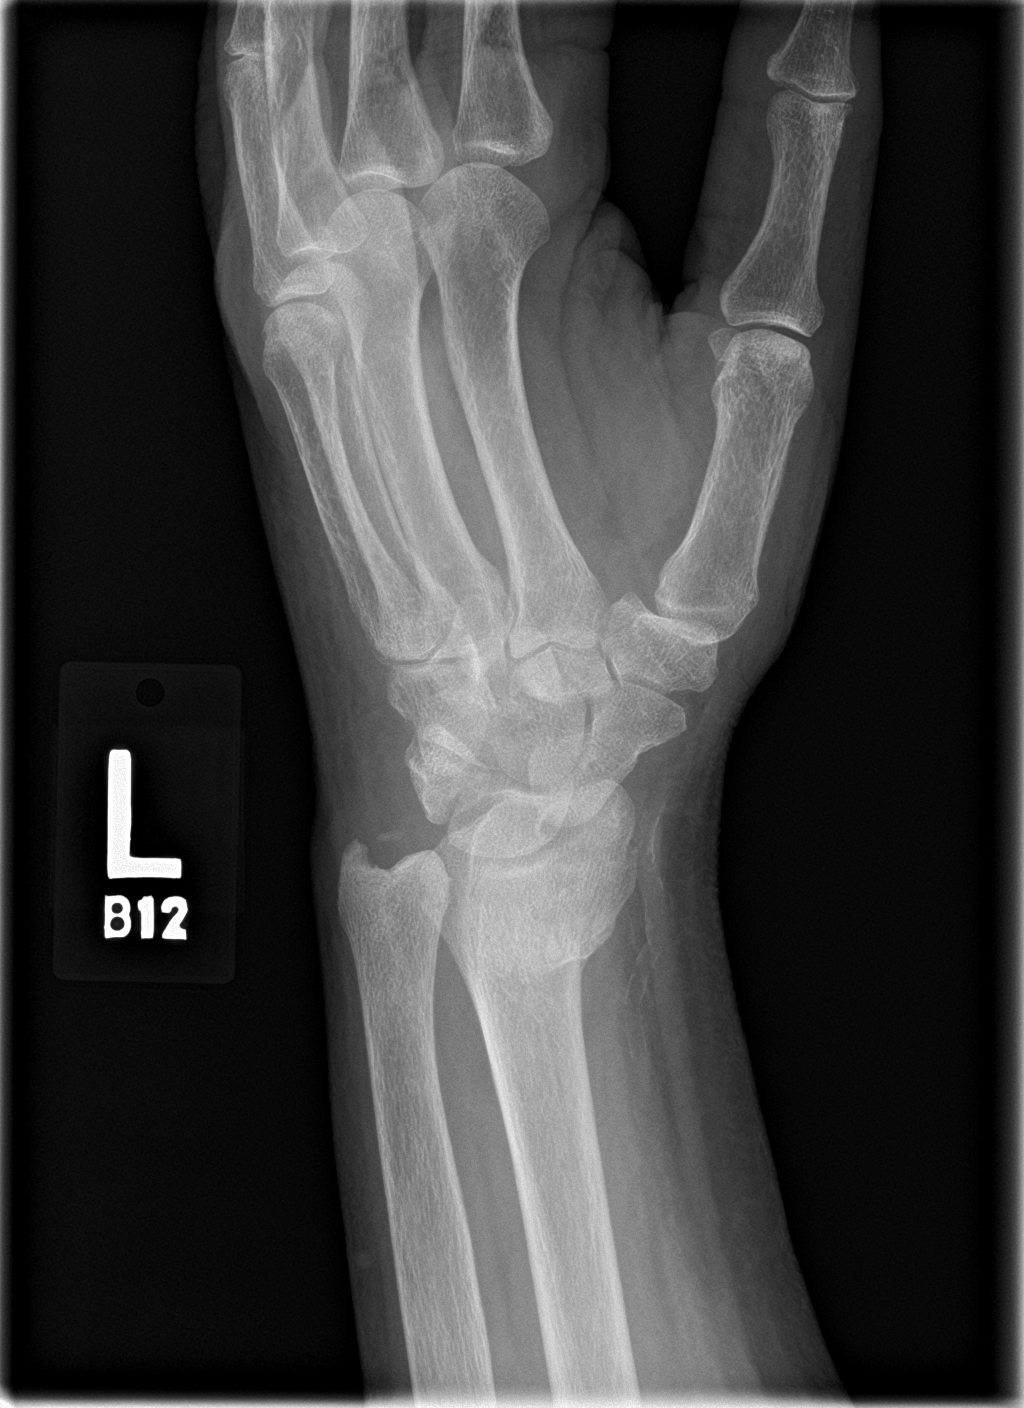
[im 3/4]
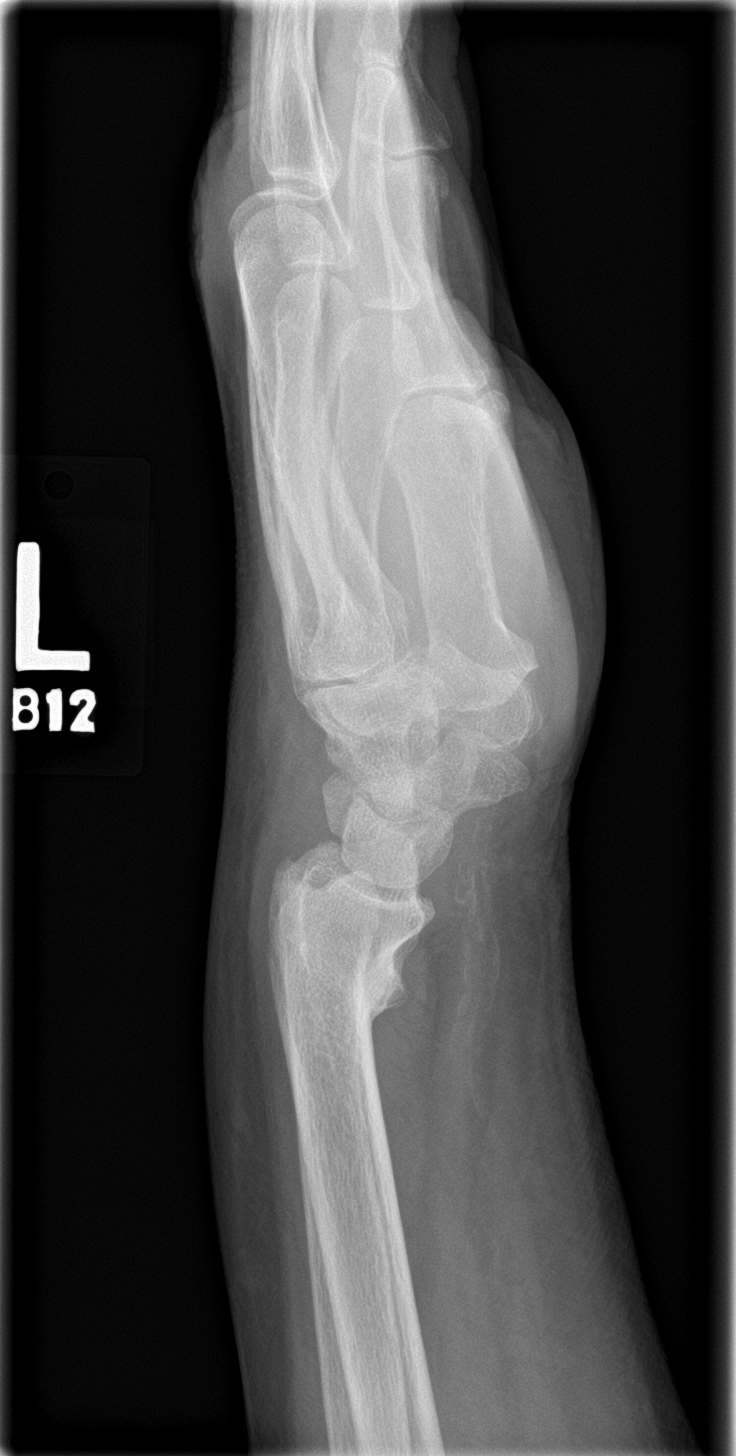
[im 4/4]
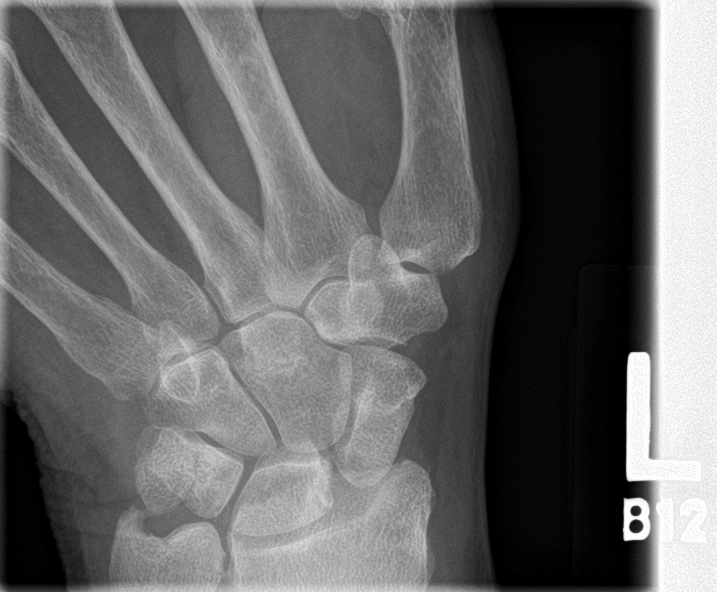

[4 of 4 positions shown; findings below may reference images not displayed]

FINDINGS: Osseous mineralization diffusely decreased.

Joint spaces preserved.

Tiny old non fused fracture fragment adjacent to the ulnar styloid.

Deformity of the distal LEFT radius secondary to old healed
metaphyseal fracture.

Fracture line is no longer visible.

Significant volar tilt of distal radial articular surface.

No acute fracture, dislocation, or bone destruction.

Small vessel vascular calcifications at radial artery.
IMPRESSION: Nonunion of old ulnar styloid fracture fragment.

Deformity of distal LEFT radius from old healed metaphyseal fracture
with significant volar tilt of distal radial articular surface.

## 2018-10-09 MED ORDER — GUAIFENESIN-DM 100-10 MG/5ML PO SYRP
5.0000 mL | ORAL_SOLUTION | ORAL | Status: DC | PRN
  Administered 2018-10-09 – 2018-10-10 (×2): 5 mL via ORAL
  Filled 2018-10-09 (×2): qty 5

## 2018-10-09 NOTE — Progress Notes (Signed)
PT Cancellation Note  Patient Details Name: DWAYNE BEGAY MRN: [id number] DOB: [birth year]   Cancelled Treatment:    Reason Eval/Treat Not Completed: Patient declined, no reason specified   Offered and encouraged session.  Pt in bed, reports feeling better and recently taken pain medication but declines session.  Stated she is getting up to bathroom with nursing assist and dizziness is better.  She declined interventions today stating "Maybe tomorrow."     Chesley Noon 10/09/2018, 11:10 AM

## 2018-10-09 NOTE — Progress Notes (Signed)
[facility] [place] NAME: [name]    MR#:  [id number]  DATE OF BIRTH:  03-08-1961  SUBJECTIVE:  CHIEF COMPLAINT:   Chief Complaint  Patient presents with  . Altered Mental Status   Complains of right-sided headache, cough,/ REVIEW OF SYSTEMS:    Review of Systems  Constitutional: Positive for malaise/fatigue. Negative for chills and fever.  HENT: Negative for sinus pain and sore throat.   Eyes: Negative for blurred vision, double vision and pain.  Respiratory: Positive for cough and wheezing. Negative for hemoptysis and shortness of breath.   Cardiovascular: Negative for chest pain, palpitations, orthopnea and leg swelling.  Gastrointestinal: Negative for abdominal pain, constipation, diarrhea, heartburn, nausea and vomiting.  Genitourinary: Negative for dysuria and hematuria.  Musculoskeletal: Negative for back pain and joint pain.  Skin: Negative for rash.  Neurological: Positive for headaches. Negative for sensory change, speech change and focal weakness.  Endo/Heme/Allergies: Does not bruise/bleed easily.  Psychiatric/Behavioral: Negative for depression. The patient is not nervous/anxious.     DRUG ALLERGIES:   Allergies  Allergen Reactions  . Ace Inhibitors Swelling and Anaphylaxis  . Ativan [Lorazepam] Other (See Comments)    Reaction:Hallucinations and headaches  . Compazine [Prochlorperazine Edisylate] Anaphylaxis, Nausea And Vomiting and Other (See Comments)    Other reaction(s): dystonia from this vs. Reglan, 23 Jul - patient relates that she takes promethazine frequently with no problems  . Sumatriptan Succinate Other (See Comments)    Other reaction(s): delirium and hallucinations per [facility] records  . Dilaudid [Hydromorphone Hcl] Other (See Comments)    Delirium   . Ondansetron Other (See Comments)    hallucinations    . Zofran [Ondansetron Hcl] Other (See Comments)    Reaction:  hallucinations   . Codeine  Nausea And Vomiting  . Gabapentin Other (See Comments)    Reaction:  Unknown    . [place] Bovis Nausea And Vomiting  . Losartan Nausea Only  . Oxycodone Anxiety  . Prochlorperazine Other (See Comments)    Reaction:  Unknown . Patient does not remember reaction but she does have vertigo and anxiety along with n and v at times. Could be used to treat any of these   . Reglan [Metoclopramide] Other (See Comments)    Per patient her Dr. Evelina Bucy her off it   . Scopolamine Other (See Comments)    Dizziness, also has vertigo already  . Tape Rash  . Tapentadol Rash    VITALS:  Blood pressure (!) 149/59, pulse 66, temperature 98.5 F (36.9 C), temperature source Oral, resp. rate 16, height 5' 5"  (1.651 m), weight 83.7 kg, SpO2 100 %.  PHYSICAL EXAMINATION:   Physical Exam  GENERAL:  58 y.o.-year-old patient lying in the bed. EYES: Pupils equal, round, reactive to light and accommodation. No scleral icterus. Extraocular muscles intact.  HEENT: Head atraumatic, normocephalic. Oropharynx and nasopharynx clear.  Bilateral temporal tenderness, right-sided headache NECK:  Supple, no jugular venous distention. No thyroid enlargement, no tenderness.  LUNGS: Bilateral wheezing.  Good air entry CARDIOVASCULAR: S1, S2 normal. No murmurs, rubs, or gallops.  ABDOMEN: Soft, nontender, nondistended. Bowel sounds present. No organomegaly or mass.  EXTREMITIES: No cyanosis, clubbing or edema b/l.    NEUROLOGIC: Cranial nerves II through XII are intact. No focal Motor or sensory deficits b/l.   PSYCHIATRIC: The patient is alert and oriented x 3.  SKIN: No obvious rash, lesion, or ulcer.   LABORATORY PANEL:   CBC Recent Labs  Lab 10/05/18 0641  WBC 5.8  HGB 9.5*  HCT 28.8*  PLT 198   ------------------------------------------------------------------------------------------------------------------ Chemistries  Recent Labs  Lab 10/04/18 1140  10/07/18 0753  NA 135   < > 129*  K 4.6   < > 4.7  CL  98   < > 89*  CO2 23   < > 27  GLUCOSE 159*   < > 499*  BUN 38*   < > 19  CREATININE 9.04*   < > 4.68*  CALCIUM 8.1*   < > 8.1*  AST 25  --   --   ALT 15  --   --   ALKPHOS 112  --   --   BILITOT 0.9  --   --    < > = values in this interval not displayed.   ------------------------------------------------------------------------------------------------------------------  Cardiac Enzymes Recent Labs  Lab 10/05/18 0641  TROPONINI <0.03   ------------------------------------------------------------------------------------------------------------------  RADIOLOGY:  No results found.   ASSESSMENT AND PLAN:   *Uncontrolled diabetes mellitus with hyperglycemia.  Have given her 1 more dose of Lantus today.  Can re-dose tomorrow if continues to be elevated.  Sliding scale insulin.  Uncontrolled due to steroids.  *Right-sided headache.    Concern regarding temporal arteritis.  continue high-dose Solu-Medrol, biopsy postponed because of dialysis and the timing of biopsy by surgery yesterday.  Patient prefers to have biopsy while inpatient. ESR is  83.  * Syncope vs seizure  Consulted neurology  No seizure medications at this time  Telemetry monitoring. Echocardiogram done and nothing acute troponin normal. EEG checked and normal. Out of bed to chair.* Carotid stenosis.  Could be playing a part in her symptoms.  Vascular surgery consulted, advised outpatient follow-up  * COPD exacerbation with acute hypoxic respiratory failure.  On steroids and scheduled nebulizers. patient still has cough, repeat chest x-ray again. Oxygen as needed.  * Dizziness.  Likely labyrinthitis from her sinus congestion and bronchitis. Added meclizine.  Improving  * End-stage renal disease on hemodialysis.,  Wednesday, Friday, appreciate nephrology following. Renal mass, followed by Dr. Erlene Quan.  All the records are reviewed and case discussed with Care Management/Social Worker Management plans  discussed with the patient, family and they are in agreement.  CODE STATUS:FULL CODE  DVT Prophylaxis: SCDs  TOTAL TIME TAKING CARE OF THIS PATIENT: 35 minutes.   POSSIBLE D/C IN 1-2 DAYS, DEPENDING ON CLINICAL CONDITION.  Epifanio Lesches M.D on 10/09/2018 at 11:56 AM  Between 7am to 6pm - Pager - [phone]  After 6pm go to [email or web address] - password [email or web address] [facility]  [facility] [place] [facility]  Office  [phone]  CC: Primary care physician; Freddy Finner, NP  Note: This dictation was prepared with Dragon dictation along with smaller phrase technology. Any transcriptional errors that result from this process are unintentional.

## 2018-10-09 NOTE — Progress Notes (Signed)
[facility]  ROUNDING NOTE   Subjective:  Patient completed dialysis yesterday. Did not have temporal artery biopsy yesterday.   Objective:  Vital signs in last 24 hours:  Temp:  [98.2 F (36.8 C)-99 F (37.2 C)] 98.5 F (36.9 C) (01/18 0810) Pulse Rate:  [66-84] 66 (01/18 0810) Resp:  [16-26] 16 (01/18 0457) BP: (89-165)/(45-69) 149/59 (01/18 0810) SpO2:  [99 %-100 %] 100 % (01/18 0810) Weight:  [83.7 kg] 83.7 kg (01/18 0457)  Weight change: 2.8 kg Filed Weights   10/08/18 0500 10/08/18 1110 10/09/18 0457  Weight: 87.1 kg 89.9 kg 83.7 kg    Intake/Output: I/O last 3 completed shifts: In: 0  Out: 1000 [Other:1000]   Intake/Output this shift:  No intake/output data recorded.  Physical Exam: General: No acute distress  Head: Normocephalic, atraumatic. Moist oral mucosal membranes  Eyes: Anicteric  Neck: Supple, trachea midline  Lungs:  Clear to auscultation, normal effort  Heart: S1S2 no rubs  Abdomen:  Soft, nontender, bowel sounds present  Extremities: Trace peripheral edema.  Neurologic: Awake, alert, following commands  Skin: No lesions  Access: AV graft    Basic Metabolic Panel: Recent Labs  Lab 10/04/18 1140 10/05/18 0641 10/05/18 1241 10/06/18 0851 10/07/18 0753 10/08/18 1119  NA 135 135  --   --  129*  --   K 4.6 5.1  --   --  4.7  --   CL 98 102  --   --  89*  --   CO2 23 21*  --   --  27  --   GLUCOSE 159* 97  --   --  499*  --   BUN 38* 41*  --   --  19  --   CREATININE 9.04* 10.03*  --   --  4.68*  --   CALCIUM 8.1* 7.8*  --   --  8.1*  --   PHOS  --   --  5.3* 4.5  --  3.1    Liver Function Tests: Recent Labs  Lab 10/04/18 1140  AST 25  ALT 15  ALKPHOS 112  BILITOT 0.9  PROT 7.5  ALBUMIN 3.6   Recent Labs  Lab 10/04/18 1140  LIPASE 83*   Recent Labs  Lab 10/04/18 1140  AMMONIA 17    CBC: Recent Labs  Lab 10/04/18 1140 10/05/18 0641  WBC 4.8 5.8  NEUTROABS 2.6  --   HGB 9.7* 9.5*  HCT 30.7* 28.8*   MCV 93.6 94.7  PLT 209 198    Cardiac Enzymes: Recent Labs  Lab 10/04/18 1140 10/04/18 1906 10/05/18 0023 10/05/18 0641  TROPONINI <0.03 <0.03 <0.03 <0.03    BNP: Invalid input(s): POCBNP  CBG: Recent Labs  Lab 10/08/18 1333 10/08/18 1637 10/08/18 2109 10/09/18 0811 10/09/18 0831  GLUCAP 68* 104* 271* 85* 97*    Microbiology: Results for orders placed or performed during the hospital encounter of 10/04/18  MRSA PCR Screening     Status: None   Collection Time: 10/04/18  7:10 PM  Result Value Ref Range Status   MRSA by PCR NEGATIVE NEGATIVE Final    Comment:        The GeneXpert MRSA Assay (FDA approved for NASAL specimens only), is one component of a comprehensive MRSA colonization surveillance program. It is not intended to diagnose MRSA infection nor to guide or monitor treatment for MRSA infections. Performed at [facility], [address], [place], [place] [address]     Coagulation Studies: No  results for input(s): LABPROT, INR in the last 72 hours.  Urinalysis: No results for input(s): COLORURINE, LABSPEC, PHURINE, GLUCOSEU, HGBUR, BILIRUBINUR, KETONESUR, PROTEINUR, UROBILINOGEN, NITRITE, LEUKOCYTESUR in the last 72 hours.  Invalid input(s): APPERANCEUR    Imaging: No results found.   Medications:   . sodium chloride     . amLODipine  10 mg Oral Daily  . aspirin  81 mg Oral Daily  . atorvastatin  20 mg Oral QPM  . carvedilol  3.125 mg Oral BID WC  . Chlorhexidine Gluconate Cloth  6 each Topical Q0600  . cholecalciferol  400 Units Oral Daily  . epoetin (EPOGEN/PROCRIT) injection  10,000 Units Intravenous Q M,W,F-HD  . famotidine  20 mg Oral Daily  . ferrous sulfate  325 mg Oral Q breakfast  . FLUoxetine  20 mg Oral QHS  . heparin  5,000 Units Subcutaneous Q8H  . insulin aspart  0-20 Units Subcutaneous TID WC  . insulin aspart  0-5 Units Subcutaneous QHS  . ipratropium-albuterol  3 mL Nebulization BID  .  lipase/protease/amylase  24,000 Units Oral Q lunch  . lipase/protease/amylase  36,000 Units Oral BID AC  . loratadine  10 mg Oral Daily  . meclizine  25 mg Oral BID  . methylPREDNISolone (SOLU-MEDROL) injection  80 mg Intravenous Daily  . multivitamin  1 tablet Oral Daily  . sevelamer carbonate  2,400 mg Oral TID WC  . sevelamer carbonate  800 mg Oral BID BM  . sodium chloride flush  3 mL Intravenous Q12H  . cyanocobalamin  1,000 mcg Oral Daily   sodium chloride, acetaminophen **OR** acetaminophen, albuterol, alum & mag hydroxide-simeth, guaiFENesin-dextromethorphan, HYDROcodone-acetaminophen, lidocaine, nitroGLYCERIN, ondansetron **OR** ondansetron (ZOFRAN) IV, promethazine, sodium chloride flush  Assessment/ Plan:  58 y.o. female with diabetes, hypertension, hyperlipidemia, diabetic retinopathy, diabetic neuropathy, diabetic gastroparesis, pancreatic insufficiency, depression, GERD. Renal masses, M-spike   MWF/ [facility], left arm AVG  1.  ESRD on HD MWF.  Patient completed dialysis yesterday.  No acute indication for dialysis today.  Next also scheduled for Monday.  2.  Anemia chronic kidney disease.  Hemoglobin currently 9.5.  She has history of renal masses however we previously discussed with the patient to use of Epogen in this setting.   -Maintain Epogen 10,000 units IV with dialysis.  3.  Secondary hyperparathyroidism.  Phosphorus was 3.1 yesterday.  Maintain the patient on current dosage of Renvela.  4.  Renal masses.  Followed by Dr. Erlene Quan.  CT scan was performed on 10/02/18.  5.  Temporal pain: Temporal artery biopsy deferred till next week.   LOS: 3 Eriberto Felch 1/18/202011:52 AM

## 2018-10-10 LAB — CBC
HCT: 31.8 % — ABNORMAL LOW (ref 36.0–46.0)
Hemoglobin: 9.7 g/dL — ABNORMAL LOW (ref 12.0–15.0)
MCH: 29.2 pg (ref 26.0–34.0)
MCHC: 30.5 g/dL (ref 30.0–36.0)
MCV: 95.8 fL (ref 80.0–100.0)
Platelets: 259 10*3/uL (ref 150–400)
RBC: 3.32 MIL/uL — ABNORMAL LOW (ref 3.87–5.11)
RDW: 16.4 % — ABNORMAL HIGH (ref 11.5–15.5)
WBC: 6.9 10*3/uL (ref 4.0–10.5)
nRBC: 0.4 % — ABNORMAL HIGH (ref 0.0–0.2)

## 2018-10-10 LAB — GLUCOSE, CAPILLARY
Glucose-Capillary: 187 mg/dL — ABNORMAL HIGH (ref 70–99)
Glucose-Capillary: 178 mg/dL — ABNORMAL HIGH (ref 70–99)
Glucose-Capillary: 166 mg/dL — ABNORMAL HIGH (ref 70–99)
Glucose-Capillary: 234 mg/dL — ABNORMAL HIGH (ref 70–99)

## 2018-10-10 MED ORDER — AZITHROMYCIN 250 MG PO TABS
500.0000 mg | ORAL_TABLET | Freq: Every day | ORAL | Status: AC
  Administered 2018-10-10: 500 mg via ORAL
  Filled 2018-10-10: qty 2

## 2018-10-10 MED ORDER — BENZONATATE 100 MG PO CAPS
200.0000 mg | ORAL_CAPSULE | Freq: Three times a day (TID) | ORAL | Status: DC | PRN
  Administered 2018-10-10 (×2): 200 mg via ORAL
  Filled 2018-10-10 (×2): qty 2

## 2018-10-10 MED ORDER — HYDROCOD POLST-CPM POLST ER 10-8 MG/5ML PO SUER
5.0000 mL | Freq: Two times a day (BID) | ORAL | Status: DC
  Administered 2018-10-10 – 2018-10-11 (×3): 5 mL via ORAL
  Filled 2018-10-10 (×3): qty 5

## 2018-10-10 MED ORDER — AZITHROMYCIN 250 MG PO TABS
250.0000 mg | ORAL_TABLET | Freq: Every day | ORAL | Status: DC
  Administered 2018-10-11: 250 mg via ORAL
  Filled 2018-10-10: qty 1

## 2018-10-10 NOTE — Plan of Care (Signed)
Her headache is at it's worse when she coughs, which is frequently.  Cough has improved, but is not gone after her first dose of Tussonex

## 2018-10-10 NOTE — Progress Notes (Signed)
[facility]  ROUNDING NOTE   Subjective:  Patient due for temporal artery biopsy tomorrow. Resting comfortably in bed at the moment. Patient also due for dialysis tomorrow.   Objective:  Vital signs in last 24 hours:  Temp:  [97.5 F (36.4 C)-98.6 F (37 C)] 97.6 F (36.4 C) (01/19 0758) Pulse Rate:  [66-74] 71 (01/19 0758) Resp:  [16-17] 16 (01/19 0758) BP: (136-175)/(49-65) 175/63 (01/19 0758) SpO2:  [93 %-100 %] 100 % (01/19 0758) Weight:  [85.9 kg] 85.9 kg (01/19 0445)  Weight change: -3.989 kg Filed Weights   10/08/18 1110 10/09/18 0457 10/10/18 0445  Weight: 89.9 kg 83.7 kg 85.9 kg    Intake/Output: I/O last 3 completed shifts: In: 240 [P.O.:240] Out: 0    Intake/Output this shift:  Total I/O In: 240 [P.O.:240] Out: 0   Physical Exam: General: No acute distress  Head: Normocephalic, atraumatic. Moist oral mucosal membranes  Eyes: Anicteric  Neck: Supple, trachea midline  Lungs:  Clear to auscultation, normal effort  Heart: S1S2 no rubs  Abdomen:  Soft, nontender, bowel sounds present  Extremities: Trace peripheral edema.  Neurologic: Awake, alert, following commands  Skin: No lesions  Access: AV graft    Basic Metabolic Panel: Recent Labs  Lab 10/04/18 1140 10/05/18 0641 10/05/18 1241 10/06/18 0851 10/07/18 0753 10/08/18 1119  NA 135 135  --   --  129*  --   K 4.6 5.1  --   --  4.7  --   CL 98 102  --   --  89*  --   CO2 23 21*  --   --  27  --   GLUCOSE 159* 97  --   --  499*  --   BUN 38* 41*  --   --  19  --   CREATININE 9.04* 10.03*  --   --  4.68*  --   CALCIUM 8.1* 7.8*  --   --  8.1*  --   PHOS  --   --  5.3* 4.5  --  3.1    Liver Function Tests: Recent Labs  Lab 10/04/18 1140  AST 25  ALT 15  ALKPHOS 112  BILITOT 0.9  PROT 7.5  ALBUMIN 3.6   Recent Labs  Lab 10/04/18 1140  LIPASE 83*   Recent Labs  Lab 10/04/18 1140  AMMONIA 17    CBC: Recent Labs  Lab 10/04/18 1140 10/05/18 0641  10/10/18 0529  WBC 4.8 5.8 6.9  NEUTROABS 2.6  --   --   HGB 9.7* 9.5* 9.7*  HCT 30.7* 28.8* 31.8*  MCV 93.6 94.7 95.8  PLT 209 198 259    Cardiac Enzymes: Recent Labs  Lab 10/04/18 1140 10/04/18 1906 10/05/18 0023 10/05/18 0641  TROPONINI <0.03 <0.03 <0.03 <0.03    BNP: Invalid input(s): POCBNP  CBG: Recent Labs  Lab 10/09/18 1158 10/09/18 1644 10/09/18 2120 10/10/18 0801 10/10/18 1149  GLUCAP 201* 271* 264* 187* 178*    Microbiology: Results for orders placed or performed during the hospital encounter of 10/04/18  MRSA PCR Screening     Status: None   Collection Time: 10/04/18  7:10 PM  Result Value Ref Range Status   MRSA by PCR NEGATIVE NEGATIVE Final    Comment:        The GeneXpert MRSA Assay (FDA approved for NASAL specimens only), is one component of a comprehensive MRSA colonization surveillance program. It is not intended to diagnose MRSA infection nor to guide or monitor  treatment for MRSA infections. Performed at [facility], [address] [place], [place] [address]     Coagulation Studies: No results for input(s): LABPROT, INR in the last 72 hours.  Urinalysis: No results for input(s): COLORURINE, LABSPEC, PHURINE, GLUCOSEU, HGBUR, BILIRUBINUR, KETONESUR, PROTEINUR, UROBILINOGEN, NITRITE, LEUKOCYTESUR in the last 72 hours.  Invalid input(s): APPERANCEUR    Imaging: Dg Chest 2 View  Result Date: 10/09/2018 CLINICAL DATA:  Cough, wheezing, altered mental status, end-stage renal disease EXAM: CHEST - 2 VIEW COMPARISON:  10/04/2018 FINDINGS: Left chest and axillary vascular stents noted related to prior dialysis access intervention. Heart is enlarged with vascular congestion and diffuse interstitial opacities suggesting component of early CHF. Minor associated basilar atelectasis. No large effusion. No pneumothorax. Trachea is midline. No acute osseous finding. IMPRESSION: Cardiomegaly with mild interstitial edema pattern and  basilar atelectasis. Electronically Signed   By: Jerilynn Mages.  Shick M.D.   On: 10/09/2018 14:08     Medications:   . sodium chloride     . amLODipine  10 mg Oral Daily  . aspirin  81 mg Oral Daily  . atorvastatin  20 mg Oral QPM  . [START ON 10/11/2018] azithromycin  250 mg Oral Daily  . carvedilol  3.125 mg Oral BID WC  . Chlorhexidine Gluconate Cloth  6 each Topical Q0600  . chlorpheniramine-HYDROcodone  5 mL Oral Q12H  . cholecalciferol  400 Units Oral Daily  . epoetin (EPOGEN/PROCRIT) injection  10,000 Units Intravenous Q M,W,F-HD  . famotidine  20 mg Oral Daily  . ferrous sulfate  325 mg Oral Q breakfast  . FLUoxetine  20 mg Oral QHS  . heparin  5,000 Units Subcutaneous Q8H  . insulin aspart  0-20 Units Subcutaneous TID WC  . insulin aspart  0-5 Units Subcutaneous QHS  . ipratropium-albuterol  3 mL Nebulization BID  . lipase/protease/amylase  24,000 Units Oral Q lunch  . lipase/protease/amylase  36,000 Units Oral BID AC  . loratadine  10 mg Oral Daily  . meclizine  25 mg Oral BID  . methylPREDNISolone (SOLU-MEDROL) injection  80 mg Intravenous Daily  . multivitamin  1 tablet Oral Daily  . sevelamer carbonate  2,400 mg Oral TID WC  . sevelamer carbonate  800 mg Oral BID BM  . sodium chloride flush  3 mL Intravenous Q12H  . cyanocobalamin  1,000 mcg Oral Daily   sodium chloride, acetaminophen **OR** acetaminophen, albuterol, alum & mag hydroxide-simeth, benzonatate, HYDROcodone-acetaminophen, lidocaine, nitroGLYCERIN, ondansetron **OR** ondansetron (ZOFRAN) IV, promethazine, sodium chloride flush  Assessment/ Plan:  58 y.o. female with diabetes, hypertension, hyperlipidemia, diabetic retinopathy, diabetic neuropathy, diabetic gastroparesis, pancreatic insufficiency, depression, GERD. Renal masses, M-spike   MWF/ [facility], left arm AVG  1.  ESRD on HD MWF.  Patient due for dialysis again tomorrow.  Orders to be prepared.  2.  Anemia chronic kidney disease.  Hemoglobin  currently 9.7.  She has history of renal masses however we previously discussed with the patient to use of Epogen in this setting.   -Continue Epogen with dialysis.  3.  Secondary hyperparathyroidism.  Repeat serum phosphorus tomorrow.  Otherwise continue Renvela with meals and snacks.  4.  Renal masses.  Followed by Dr. Erlene Quan.  CT scan was performed on 10/02/18.  5.  Temporal pain: Temporal artery biopsy deferred till tomorrow.   LOS: 4 Asjah Rauda 1/19/202012:41 PM

## 2018-10-10 NOTE — Progress Notes (Signed)
[name] NAME: [name]    MR#:  [id number]  DATE OF BIRTH:  September 22, 1961  SUBJECTIVE: Patient still has lots of cough, unable to rest at night due to cough.  CHIEF COMPLAINT:   Chief Complaint  Patient presents with  . Altered Mental Status   Complains of right-sided headache, cough,/and want to have biopsy done while in the hospital. REVIEW OF SYSTEMS:    Review of Systems  Constitutional: Positive for malaise/fatigue. Negative for chills and fever.  HENT: Negative for sinus pain and sore throat.   Eyes: Negative for blurred vision, double vision and pain.  Respiratory: Positive for cough and wheezing. Negative for hemoptysis and shortness of breath.   Cardiovascular: Negative for chest pain, palpitations, orthopnea and leg swelling.  Gastrointestinal: Negative for abdominal pain, constipation, diarrhea, heartburn, nausea and vomiting.  Genitourinary: Negative for dysuria and hematuria.  Musculoskeletal: Negative for back pain and joint pain.  Skin: Negative for rash.  Neurological: Positive for headaches. Negative for sensory change, speech change and focal weakness.  Endo/Heme/Allergies: Does not bruise/bleed easily.  Psychiatric/Behavioral: Negative for depression. The patient is not nervous/anxious.     DRUG ALLERGIES:   Allergies  Allergen Reactions  . Ace Inhibitors Swelling and Anaphylaxis  . Ativan [Lorazepam] Other (See Comments)    Reaction:Hallucinations and headaches  . Compazine [Prochlorperazine Edisylate] Anaphylaxis, Nausea And Vomiting and Other (See Comments)    Other reaction(s): dystonia from this vs. Reglan, 23 Jul - patient relates that she takes promethazine frequently with no problems  . Sumatriptan Succinate Other (See Comments)    Other reaction(s): delirium and hallucinations per [facility] records  . Dilaudid [Hydromorphone Hcl] Other (See Comments)    Delirium   . Ondansetron Other (See  Comments)    hallucinations    . Zofran [Ondansetron Hcl] Other (See Comments)    Reaction:  hallucinations   . Codeine Nausea And Vomiting  . Gabapentin Other (See Comments)    Reaction:  Unknown    . Lac Bovis Nausea And Vomiting  . Losartan Nausea Only  . Oxycodone Anxiety  . Prochlorperazine Other (See Comments)    Reaction:  Unknown . Patient does not remember reaction but she does have vertigo and anxiety along with n and v at times. Could be used to treat any of these   . Reglan [Metoclopramide] Other (See Comments)    Per patient her Dr. Evelina Bucy her off it   . Scopolamine Other (See Comments)    Dizziness, also has vertigo already  . Tape Rash  . Tapentadol Rash    VITALS:  Blood pressure (!) 175/63, pulse 71, temperature 97.6 F (36.4 C), temperature source Axillary, resp. rate 16, height 5' 5"  (1.651 m), weight 85.9 kg, SpO2 100 %.  PHYSICAL EXAMINATION:   Physical Exam  GENERAL:  58 y.o.-year-old patient lying in the bed.  Appears uncomfortable because of nonstop cough EYES: Pupils equal, round, reactive to light and accommodation. No scleral icterus. Extraocular muscles intact.  HEENT: Head atraumatic, normocephalic. Oropharynx and nasopharynx clear.  Bilateral temporal tenderness, right-sided headache NECK:  Supple, no jugular venous distention. No thyroid enlargement, no tenderness.  LUNGS: Bilateral wheezing.  Good air entry CARDIOVASCULAR: S1, S2 normal. No murmurs, rubs, or gallops.  ABDOMEN: Soft, nontender, nondistended. Bowel sounds present. No organomegaly or mass.  EXTREMITIES: No cyanosis, clubbing or edema b/l.    NEUROLOGIC: Cranial nerves II through XII are intact. No focal Motor or sensory deficits  b/l.   PSYCHIATRIC: The patient is alert and oriented x 3.  SKIN: No obvious rash, lesion, or ulcer.   LABORATORY PANEL:   CBC Recent Labs  Lab 10/10/18 0529  WBC 6.9  HGB 9.7*  HCT 31.8*  PLT 259    ------------------------------------------------------------------------------------------------------------------ Chemistries  Recent Labs  Lab 10/04/18 1140  10/07/18 0753  NA 135   < > 129*  K 4.6   < > 4.7  CL 98   < > 89*  CO2 23   < > 27  GLUCOSE 159*   < > 499*  BUN 38*   < > 19  CREATININE 9.04*   < > 4.68*  CALCIUM 8.1*   < > 8.1*  AST 25  --   --   ALT 15  --   --   ALKPHOS 112  --   --   BILITOT 0.9  --   --    < > = values in this interval not displayed.   ------------------------------------------------------------------------------------------------------------------  Cardiac Enzymes Recent Labs  Lab 10/05/18 0641  TROPONINI <0.03   ------------------------------------------------------------------------------------------------------------------  RADIOLOGY:  Dg Chest 2 View  Result Date: 10/09/2018 CLINICAL DATA:  Cough, wheezing, altered mental status, end-stage renal disease EXAM: CHEST - 2 VIEW COMPARISON:  10/04/2018 FINDINGS: Left chest and axillary vascular stents noted related to prior dialysis access intervention. Heart is enlarged with vascular congestion and diffuse interstitial opacities suggesting component of early CHF. Minor associated basilar atelectasis. No large effusion. No pneumothorax. Trachea is midline. No acute osseous finding. IMPRESSION: Cardiomegaly with mild interstitial edema pattern and basilar atelectasis. Electronically Signed   By: Jerilynn Mages.  Shick M.D.   On: 10/09/2018 14:08     ASSESSMENT AND PLAN:   *Uncontrolled diabetes mellitus with hyperglycemia.  Have given her 1 more dose of Lantus today.  Can re-dose tomorrow if continues to be elevated.  Sliding scale insulin.  Uncontrolled due to steroids.  *Right-sided headache.    Concern regarding temporal arteritis.  continue high-dose Solu-Medrol, biopsy postponed because of dialysis and the timing of biopsy by surgery yesterday.  Patient prefers to have biopsy while inpatient.  Does  not want to go home without biopsy. ESR is  83.   * Syncope vs seizure  Consulted neurology  No seizure medications at this time  Telemetry monitoring. Echocardiogram done and nothing acute troponin normal. EEG checked and normal. Out of bed to chair.* Carotid stenosis.  Could be playing a part in her symptoms.  Vascular surgery consulted, advised outpatient follow-up  * COPD exacerbation with acute hypoxic respiratory failure.  On steroids and scheduled nebulizers. patient still has cough, r chest x-ray done showed bilateral atelectasis, pulmonary edema.  Patient will get dialysis tomorrow, start empiric antibiotics, Cough; changed to Tussionex, discontinue Robitussin. * Dizziness.  Likely labyrinthitis from her sinus congestion and bronchitis. Added meclizine.  Improving  * End-stage renal disease on hemodialysis.,  Wednesday, Friday, appreciate nephrology following. Renal mass, followed by Dr. Erlene Quan.  All the records are reviewed and case discussed with Care Management/Social Worker Management plans discussed with the patient, family and they are in agreement.  CODE STATUS:FULL CODE  DVT Prophylaxis: SCDs  TOTAL TIME TAKING CARE OF THIS PATIENT: 35 minutes.   POSSIBLE D/C IN 1-2 DAYS, DEPENDING ON CLINICAL CONDITION.  Epifanio Lesches M.D on 10/10/2018 at 10:39 AM  Between 7am to 6pm - Pager - [phone]  After 6pm go to [email or web address] - password [email or web address] [facility]  Office  [phone]  CC: Primary care physician; Freddy Finner, NP  Note: This dictation was prepared with Dragon dictation along with smaller phrase technology. Any transcriptional errors that result from this process are unintentional.

## 2018-10-11 ENCOUNTER — Encounter: Payer: Self-pay | Admitting: *Deleted

## 2018-10-11 ENCOUNTER — Encounter
Admission: EM | Disposition: A | Discharge status: Home / Self Care | Payer: Self-pay | Source: Home / Self Care | Attending: Internal Medicine

## 2018-10-11 ENCOUNTER — Inpatient Hospital Stay: Payer: Medicare Other | Admitting: Certified Registered Nurse Anesthetist

## 2018-10-11 HISTORY — PX: ARTERY BIOPSY: SHX891

## 2018-10-11 LAB — GLUCOSE, CAPILLARY
Glucose-Capillary: 223 mg/dL — ABNORMAL HIGH (ref 70–99)
Glucose-Capillary: 59 mg/dL — ABNORMAL LOW (ref 70–99)
Glucose-Capillary: 62 mg/dL — ABNORMAL LOW (ref 70–99)
Glucose-Capillary: 99 mg/dL (ref 70–99)

## 2018-10-11 LAB — BASIC METABOLIC PANEL
Anion gap: 11 (ref 5–15)
BUN: 72 mg/dL — ABNORMAL HIGH (ref 6–20)
CO2: 25 mmol/L (ref 22–32)
Calcium: 8.1 mg/dL — ABNORMAL LOW (ref 8.9–10.3)
Chloride: 92 mmol/L — ABNORMAL LOW (ref 98–111)
Creatinine, Ser: 7.48 mg/dL — ABNORMAL HIGH (ref 0.44–1.00)
GFR calc Af Amer: 6 mL/min — ABNORMAL LOW (ref >60)
GFR calc non Af Amer: 5 mL/min — ABNORMAL LOW (ref >60)
Glucose, Bld: 218 mg/dL — ABNORMAL HIGH (ref 70–99)
Potassium: 4.6 mmol/L (ref 3.5–5.1)
Sodium: 128 mmol/L — ABNORMAL LOW (ref 135–145)

## 2018-10-11 LAB — PHOSPHORUS: Phosphorus: 3.7 mg/dL (ref 2.5–4.6)

## 2018-10-11 SURGERY — BIOPSY TEMPORAL ARTERY
Anesthesia: General | Laterality: Right

## 2018-10-11 MED ORDER — DEXTROSE 50 % IV SOLN
12.5000 g | Freq: Once | INTRAVENOUS | Status: AC
  Administered 2018-10-11: 12.5 g via INTRAVENOUS

## 2018-10-11 MED ORDER — CEFAZOLIN SODIUM 1 G IJ SOLR
INTRAMUSCULAR | Status: AC
  Filled 2018-10-11: qty 20

## 2018-10-11 MED ORDER — PREDNISONE 20 MG PO TABS: ORAL_TABLET | ORAL | 0 refills | Status: DC

## 2018-10-11 MED ORDER — HYDROCOD POLST-CPM POLST ER 10-8 MG/5ML PO SUER: 5.0000 mL | Freq: Two times a day (BID) | ORAL | 0 refills | Status: DC

## 2018-10-11 MED ORDER — PROPOFOL 10 MG/ML IV BOLUS
INTRAVENOUS | Status: AC
  Filled 2018-10-11: qty 20

## 2018-10-11 MED ORDER — PREDNISONE 10 MG PO TABS: 60.0000 mg | ORAL_TABLET | Freq: Every day | ORAL | 0 refills | Status: DC

## 2018-10-11 MED ORDER — LIDOCAINE HCL 1 % IJ SOLN
INTRAMUSCULAR | Status: DC | PRN
  Administered 2018-10-11: 7 mL

## 2018-10-11 MED ORDER — AZITHROMYCIN 250 MG PO TABS: ORAL_TABLET | ORAL | 0 refills | Status: DC

## 2018-10-11 MED ORDER — DEXAMETHASONE SODIUM PHOSPHATE 10 MG/ML IJ SOLN
INTRAMUSCULAR | Status: AC
  Filled 2018-10-11: qty 1

## 2018-10-11 MED ORDER — LIDOCAINE HCL (PF) 1 % IJ SOLN
INTRAMUSCULAR | Status: AC
  Filled 2018-10-11: qty 30

## 2018-10-11 MED ORDER — DEXTROSE 50 % IV SOLN
INTRAVENOUS | Status: AC
  Administered 2018-10-11: 12.5 g via INTRAVENOUS
  Filled 2018-10-11: qty 50

## 2018-10-11 MED ORDER — MIDAZOLAM HCL 2 MG/2ML IJ SOLN
INTRAMUSCULAR | Status: AC
  Filled 2018-10-11: qty 2

## 2018-10-11 MED ORDER — CARVEDILOL 3.125 MG PO TABS: 3.1250 mg | ORAL_TABLET | Freq: Two times a day (BID) | ORAL | 0 refills | Status: DC

## 2018-10-11 MED ORDER — FENTANYL CITRATE (PF) 100 MCG/2ML IJ SOLN
INTRAMUSCULAR | Status: AC
  Filled 2018-10-11: qty 2

## 2018-10-11 MED ORDER — FENTANYL CITRATE (PF) 100 MCG/2ML IJ SOLN: 25.0000 ug | INTRAMUSCULAR | Status: DC | PRN

## 2018-10-11 MED ORDER — CEFAZOLIN SODIUM-DEXTROSE 2-3 GM-%(50ML) IV SOLR
INTRAVENOUS | Status: DC | PRN
  Administered 2018-10-11: 2 g via INTRAVENOUS

## 2018-10-11 MED ORDER — ONDANSETRON HCL 4 MG/2ML IJ SOLN
INTRAMUSCULAR | Status: AC
  Filled 2018-10-11: qty 2

## 2018-10-11 MED ORDER — BUPIVACAINE HCL (PF) 0.5 % IJ SOLN
INTRAMUSCULAR | Status: AC
  Filled 2018-10-11: qty 30

## 2018-10-11 MED ORDER — FENTANYL CITRATE (PF) 100 MCG/2ML IJ SOLN
INTRAMUSCULAR | Status: DC | PRN
  Administered 2018-10-11: 25 ug via INTRAVENOUS

## 2018-10-11 MED ORDER — SODIUM CHLORIDE 0.9 % IV SOLN
INTRAVENOUS | Status: DC
  Administered 2018-10-11: 13:00:00 via INTRAVENOUS

## 2018-10-11 MED ORDER — LIDOCAINE HCL (PF) 2 % IJ SOLN
INTRAMUSCULAR | Status: AC
  Filled 2018-10-11: qty 10

## 2018-10-11 MED ORDER — MIDAZOLAM HCL 2 MG/2ML IJ SOLN
INTRAMUSCULAR | Status: DC | PRN
  Administered 2018-10-11 (×2): 0.5 mg via INTRAVENOUS

## 2018-10-11 SURGICAL SUPPLY — 35 items
ADHESIVE MASTISOL STRL (MISCELLANEOUS) ×2 IMPLANT
APPLIER CLIP 9.375 SM OPEN (CLIP)
BLADE CLIPPER SURG (BLADE) ×1 IMPLANT
BLADE SURG 15 STRL LF DISP TIS (BLADE) ×1 IMPLANT
BLADE SURG 15 STRL SS (BLADE) ×1
CANISTER SUCT 1200ML W/VALVE (MISCELLANEOUS) ×2 IMPLANT
CHLORAPREP W/TINT 26ML (MISCELLANEOUS) ×2 IMPLANT
CLIP APPLIE 9.375 SM OPEN (CLIP) IMPLANT
COVER WAND RF STERILE (DRAPES) ×1 IMPLANT
DERMABOND ADVANCED (GAUZE/BANDAGES/DRESSINGS) ×1
DERMABOND ADVANCED .7 DNX12 (GAUZE/BANDAGES/DRESSINGS) IMPLANT
DRAPE LAPAROTOMY 77X122 PED (DRAPES) ×2 IMPLANT
ELECT REM PT RETURN 9FT ADLT (ELECTROSURGICAL) ×2
ELECTRODE REM PT RTRN 9FT ADLT (ELECTROSURGICAL) ×1 IMPLANT
GAUZE SPONGE 4X4 12PLY STRL (GAUZE/BANDAGES/DRESSINGS) ×1 IMPLANT
GLOVE BIO SURGEON STRL SZ8 (GLOVE) ×5 IMPLANT
GOWN STRL REUS W/ TWL LRG LVL3 (GOWN DISPOSABLE) ×2 IMPLANT
GOWN STRL REUS W/TWL LRG LVL3 (GOWN DISPOSABLE) ×3
KIT TURNOVER KIT A (KITS) ×2 IMPLANT
LABEL OR SOLS (LABEL) ×2 IMPLANT
MARKER SKIN DUAL TIP RULER LAB (MISCELLANEOUS) ×1 IMPLANT
NDL HYPO 25X1 1.5 SAFETY (NEEDLE) ×1 IMPLANT
NEEDLE HYPO 25X1 1.5 SAFETY (NEEDLE) ×2 IMPLANT
NS IRRIG 500ML POUR BTL (IV SOLUTION) ×2 IMPLANT
PACK BASIN MINOR ARMC (MISCELLANEOUS) ×2 IMPLANT
SPONGE LAP 18X18 RF (DISPOSABLE) ×1 IMPLANT
STRIP CLOSURE SKIN 1/2X4 (GAUZE/BANDAGES/DRESSINGS) ×1 IMPLANT
SUT MNCRL 4-0 (SUTURE) ×1
SUT MNCRL 4-0 27XMFL (SUTURE) ×1
SUT SILK 3 0 (SUTURE) ×1
SUT SILK 3-0 18XBRD TIE 12 (SUTURE) IMPLANT
SUT VIC AB 3-0 SH 27 (SUTURE) ×2
SUT VIC AB 3-0 SH 27X BRD (SUTURE) ×2 IMPLANT
SUTURE MNCRL 4-0 27XMF (SUTURE) ×1 IMPLANT
SYR 10ML LL (SYRINGE) ×3 IMPLANT

## 2018-10-11 NOTE — Plan of Care (Signed)
  Problem: Activity: Goal: Risk for activity intolerance will decrease Outcome: Progressing Note:  Standby assist to [facility] , tolerating well   Problem: Pain Managment: Goal: General experience of comfort will improve Outcome: Progressing Note:  No complaints of pain this shift, did receive tussinex for a cough which helped

## 2018-10-11 NOTE — Op Note (Signed)
SURGICAL OPERATIVE REPORT  DATE OF PROCEDURE: 10/11/2018  ATTENDING Surgeon(s): Vickie Epley, MD  ANESTHESIA: Local with light IV sedation  PRE-OPERATIVE DIAGNOSIS: Clinical suspicion for temporal arteritis (icd-10's: M31.6)  POST-OPERATIVE DIAGNOSIS: Clinical suspicion for temporal arteritis (icd-10's: M31.6)  PROCEDURE(S):  1.) Right temporal artery ligation and excisional biopsy (cpt: 18299)  INTRAOPERATIVE FINDINGS: Hardened cord-like temporal artery  INTRAVENOUS FLUIDS: 50 mL crystalloid   ESTIMATED BLOOD LOSS: Minimal (<10 mL)  URINE OUTPUT: No Foley catheter   SPECIMENS: 3 cm segment of Right temporal artery  IMPLANTS: None  DRAINS: None  COMPLICATIONS: None apparent  CONDITION AT END OF PROCEDURE: Hemodynamically stable and awake  DISPOSITION OF PATIENT: PACU  INDICATIONS FOR PROCEDURE:  Patient is a 58 y.o. female who presented with unilateral Right-sided headache and jaw claudication with elevated erythrocyte sedimentation rate (>50 mm/hr) without recognized recent vision changes. All risks, benefits, and alternatives to above elective procedures were discussed with the patient, who elected to proceed, and informed consent was accordingly obtained at that time. All of patient's questions were answered to her expressed satisfaction.  DETAILS OF PROCEDURE: Patient was brought to the operating suite and appropriately identified. Light IV sedation was administered by anesthetist along with appropriate pre-operative antibiotics. In supine position, operative site was prepped and draped in the usual sterile fashion, and following a brief time out, local anesthetic without epinephrine was injected into the subcutaneous tissue over site of planned incision. 2.5 cm linear incision was then made over palpable temporal artery pulse and parallel to shaved hair line using a #15 blade scalpel and extended deep through superficial subcutaneous tissue using sharp/blunt  dissection and minimal electrocautery. The thin superficial fascia overlying the visualized temporal artery was sharply divided with scissors. Proximal and distal ends of the isolated temporal artery segment were dissected free of surrounding tissue and ligated using 2-0 silk suture ties, small arterial branches were identified and ligated using 3-0 silk suture ties, and the temporal artery was divided on the specimen side at each end of the isolated segment. Specimen was then removed and handed off of the field for pathology.  Hemostasis was confirmed, additional local anesthetic was once more injected, and skin was re-approximated using buried interrupted 3-0 Vicryl suture. Skin was then cleaned and dried, and sterile Dermabond skin glue was applied. Patient was then safely able to be awakened and transferred to recovery for post-operative monitoring and care.  I was present for all aspects of the above procedure, and no complications were apparent.

## 2018-10-11 NOTE — Progress Notes (Signed)
[name] MRN: [id number] DOB: Dec 24, 1960   Cancelled Treatment:    Reason Eval/Treat Not Completed: Fatigue/lethargy limiting ability to participate;Patient declined, no reason specified;Other (comment). Pt recently returned from surgery for R temporal artery biopsy. Pt currently eating lunch and refuses due to days events and feeling tired. Re attempt at a date.    Larae Grooms, PTA 10/11/2018, 2:24 PM

## 2018-10-11 NOTE — Progress Notes (Signed)
Fingerstick blood sugar 99 , Dr. Kayleen Memos notified , no new orders.

## 2018-10-11 NOTE — Progress Notes (Signed)
Pre HD assessment    10/11/18 1458  Neurological  Level of Consciousness Responds to Voice  Orientation Level Oriented X4  Respiratory  Respiratory Pattern Regular;Unlabored  Chest Assessment Chest expansion symmetrical  Cardiac  ECG Monitor Yes  Cardiac Rhythm NSR;SB  Vascular  R Radial Pulse +2  L Radial Pulse +2  Edema Generalized  Integumentary  Integumentary (WDL) X  Skin Color Appropriate for ethnicity  Musculoskeletal  Musculoskeletal (WDL) X  Generalized Weakness Yes  Assistive Device None  GU Assessment  Genitourinary (WDL) X  Genitourinary Symptoms  (HD)  Psychosocial  Psychosocial (WDL) WDL

## 2018-10-11 NOTE — Progress Notes (Signed)
Spoke with Dr. Vianne Bulls, she states pt was supposed to be discharged after she came back from dialysis, however per day shift RN, jancy, no orders were placed and no discharge summary put in. But Dr. Vianne Bulls called back and gave verbal orders to place Discharge order. Pt will call daughter to come get her. Dr. Vianne Bulls has already placed prescriptions on chart. Conley Simmonds, RN, BSN

## 2018-10-11 NOTE — Progress Notes (Signed)
Pt stable for discharge,recived temporal artery biopsy ,HD,now  She has a ride.she can go home on prednisone 60 mg daily until biopsy result coe back,advise she need to see pcp,also have regular Hd. In 3-5 days for biopsy results

## 2018-10-11 NOTE — Interval H&P Note (Signed)
History and Physical Interval Note:  10/11/2018 10:14 AM  [name]  has presented today for surgery, with the diagnosis of HEADACHES ON RIGHT SIDE  The various methods of treatment have been discussed with the patient and family. After consideration of risks, benefits and other options for treatment, the patient has consented to  Procedure(s): BIOPSY TEMPORAL ARTERY (Right) as a surgical intervention .  The patient's history has been reviewed, patient examined, no change in status, stable for surgery.  I have reviewed the patient's chart and labs.  Questions were answered to the patient's satisfaction.     Vickie Epley

## 2018-10-11 NOTE — Progress Notes (Signed)
Post HD assessment. Pt tolerated tx well without c/o or complication. Increased post HD bleeding, MD aware. Net UF 5465, goal met.    10/11/18 1838  Vital Signs  Temp 98.5 F (36.9 C)  Temp Source Oral  Pulse Rate 77  Pulse Rate Source Monitor  Resp 17  BP (!) 156/59  BP Location Right Arm  BP Method Automatic  Patient Position (if appropriate) Lying  Oxygen Therapy  SpO2 97 %  O2 Device Room Air  Dialysis Weight  Weight 91.2 kg  Type of Weight Post-Dialysis  Post-Hemodialysis Assessment  Rinseback Volume (mL) 250 mL  KECN 68.4 V  Dialyzer Clearance Lightly streaked  Duration of HD Treatment -hour(s) 3.25 hour(s)  Hemodialysis Intake (mL) 500 mL  UF Total -Machine (mL) 2014 mL  Net UF (mL) 1514 mL  Tolerated HD Treatment Yes  AVG/AVF Arterial Site Held (minutes) 10 minutes  AVG/AVF Venous Site Held (minutes) 15 minutes  Education / Care Plan  Dialysis Education Provided Yes  Documented Education in Care Plan Yes  Fistula / Graft Left Upper arm Arteriovenous vein graft  Placement Date: (c)   Placed prior to admission: Yes  Orientation: Left  Access Location: Upper arm  Access Type: Arteriovenous vein graft  Site Condition No complications  Fistula / Graft Assessment Present;Thrill;Bruit  Status Deaccessed  Drainage Description None

## 2018-10-11 NOTE — Anesthesia Postprocedure Evaluation (Signed)
Anesthesia Post Note  Patient: [name]  Procedure(s) Performed: BIOPSY TEMPORAL ARTERY (Right )  Patient location during evaluation: PACU Level of consciousness: awake and alert and oriented Pain management: pain level controlled Vital Signs Assessment: post-procedure vital signs reviewed and stable Respiratory status: spontaneous breathing Cardiovascular status: blood pressure returned to baseline Anesthetic complications: no     Last Vitals:  Vitals:   10/11/18 1208 10/11/18 1223  BP: (!) 160/58 (!) 160/57  Pulse: (!) 59 (!) 59  Resp: 16 15  Temp:  36.6 C  SpO2: 99% 97%    Last Pain:  Vitals:   10/11/18 1223  TempSrc:   PainSc: 0-No pain                 Alonnah Lampkins

## 2018-10-11 NOTE — Progress Notes (Signed)
Pre HD assessment.    10/11/18 1457  Vital Signs  Temp 98 F (36.7 C)  Temp Source Oral  Pulse Rate 64  Pulse Rate Source Monitor  Resp 17  BP (!) 167/68  BP Location Right Arm  BP Method Automatic  Patient Position (if appropriate) Lying  Oxygen Therapy  SpO2 98 %  O2 Device Room Air  Pain Assessment  Pain Scale 0-10  Pain Score 0  Dialysis Weight  Weight 92.1 kg  Type of Weight Pre-Dialysis  Time-Out for Hemodialysis  What Procedure? HD  Pt Identifiers(min of two) First/Last Name;MRN/Account#  Correct Site? Yes  Correct Side? Yes  Correct Procedure? Yes  Consents Verified? Yes  Rad Studies Available? N/A  Safety Precautions Reviewed? Yes  Engineer, civil (consulting) Number  (1A)  Station Number 1  UF/Alarm Test Passed  Conductivity: Meter 14  Conductivity: Machine  13.9  pH 7.4  Reverse Osmosis main  Normal Saline Lot Number [id number]  Dialyzer Lot Number [id number]  Disposable Set Lot Number [id number]  Machine Temperature 98.6 F (37 C)  Musician and Audible Yes  Blood Lines Intact and Secured Yes  Pre Treatment Patient Checks  Vascular access used during treatment Graft  Hepatitis B Surface Antigen Results Negative  Date Hepatitis B Surface Antigen Drawn 10/05/18  Hepatitis B Surface Antibody  (>10)  Date Hepatitis B Surface Antibody Drawn 10/05/18  Hemodialysis Consent Verified Yes  Hemodialysis Standing Orders Initiated Yes  ECG (Telemetry) Monitor On Yes  Prime Ordered Normal Saline  Length of  DialysisTreatment -hour(s) 3.25 Hour(s)  Dialyzer Elisio 17H NR  Dialysate 2K, 2.5 Ca  Dialysis Anticoagulant None  Dialysate Flow Ordered 800  Blood Flow Rate Ordered 400 mL/min  Ultrafiltration Goal 1.5 Liters  Pre Treatment Labs Phosphorus  Dialysis Blood Pressure Support Ordered Normal Saline  Education / Care Plan  Dialysis Education Provided Yes  Documented Education in Care Plan Yes  Fistula / Graft Left Upper arm Arteriovenous vein graft   Placement Date: (c)   Placed prior to admission: Yes  Orientation: Left  Access Location: Upper arm  Access Type: Arteriovenous vein graft  Site Condition No complications  Fistula / Graft Assessment Present;Thrill;Bruit  Drainage Description None

## 2018-10-11 NOTE — Anesthesia Post-op Follow-up Note (Signed)
Anesthesia QCDR form completed.        

## 2018-10-11 NOTE — Progress Notes (Signed)
HD tx start    10/11/18 1509  Vital Signs  Pulse Rate 64  Pulse Rate Source Monitor  Resp 19  BP (!) 162/61  BP Location Right Arm  BP Method Automatic  Patient Position (if appropriate) Lying  Oxygen Therapy  SpO2 97 %  O2 Device Room Air  During Hemodialysis Assessment  Blood Flow Rate (mL/min) 400 mL/min  Arterial Pressure (mmHg) -120 mmHg  Venous Pressure (mmHg) 190 mmHg  Transmembrane Pressure (mmHg) 70 mmHg  Ultrafiltration Rate (mL/min) 620 mL/min  Dialysate Flow Rate (mL/min) 800 ml/min  Conductivity: Machine  13.9  HD Safety Checks Performed Yes  Dialysis Fluid Bolus Normal Saline  Bolus Amount (mL) 250 mL  Intra-Hemodialysis Comments Tx initiated  Fistula / Graft Left Upper arm Arteriovenous vein graft  Placement Date: (c)   Placed prior to admission: Yes  Orientation: Left  Access Location: Upper arm  Access Type: Arteriovenous vein graft  Status Accessed  Needle Size 15

## 2018-10-11 NOTE — Care Management Important Message (Signed)
Copy of signed Medicare IM left in patient's room (out for procedure).  

## 2018-10-11 NOTE — Anesthesia Preprocedure Evaluation (Addendum)
Anesthesia Evaluation  Patient identified by MRN, date of birth, ID band Patient awake    Airway Mallampati: II       Dental  (+) Caps   Pulmonary shortness of breath, asthma , pneumonia, COPD, former smoker,    Pulmonary exam normal        Cardiovascular hypertension, + angina + CAD, + Past MI, + Peripheral Vascular Disease, +CHF and + Orthopnea  Normal cardiovascular exam     Neuro/Psych  Headaches, PSYCHIATRIC DISORDERS Anxiety Depression TIA   GI/Hepatic hiatal hernia, GERD  ,  Endo/Other  diabetes  Renal/GU Renal disease     Musculoskeletal  (+) Arthritis ,   Abdominal Normal abdominal exam  (+)   Peds  Hematology  (+) anemia ,   Anesthesia Other Findings Past Medical History: No date: Anemia No date: Anginal pain ([place]) No date: Anxiety No date: Arthritis No date: Asthma No date: Broken wrist No date: Bronchitis 9/89/2119: chronic diastolic CHF No date: COPD (chronic obstructive pulmonary disease) (H[facility]) No date: Coronary artery disease     Comment:  a. cath 2013: stenting to RCA (report not available); b.              cath 2014: LM nl, pLAD 40%, mLAD nl, ost LCx 40%, mid LCx              nl, pRCA 30% @ site of prior stent, mRCA 50% No date: Depression No date: Diabetes mellitus without complication ([place]) No date: Diabetic neuropathy ([place]) 2006: dialysis No date: Diverticulosis No date: Dizziness No date: Dyspnea No date: Elevated lipids No date: Environmental and seasonal allergies No date: ESRD (end stage renal disease) on dialysis ([place])     Comment:  M-W-F No date: GERD (gastroesophageal reflux disease) No date: Headache No date: History of hiatal hernia No date: HOH (hard of hearing) 5: Hx of pancreatitis No date: Hypertension No date: Lower extremity edema No date: Mitral regurgitation     Comment:  a. echo 10/2013: EF 62%, noWMA, mildly dilated LA, mild               to mod MR/TR,  GR1DD No date: Myocardial infarction ([place]) No date: Orthopnea No date: Pneumonia No date: Renal cancer ([place]) No date: Renal insufficiency     Comment:  Pt is on dialysis on M,W + F. No date: Wheezing   Reproductive/Obstetrics                                                              Anesthesia Evaluation  Patient identified by MRN, date of birth, ID band Patient awake    Reviewed: Allergy & Precautions, NPO status , Patient's Chart, lab work & pertinent test results  Airway Mallampati: II       Dental  (+) Upper Dentures, Lower Dentures   Pulmonary shortness of breath and with exertion, asthma , COPD, former smoker,     + decreased breath sounds      Cardiovascular Exercise Tolerance: Poor hypertension, + angina with exertion + CAD, + Past MI, + Peripheral Vascular Disease, +CHF and + Orthopnea   Rhythm:Regular     Neuro/Psych  Headaches, PSYCHIATRIC DISORDERS Anxiety Depression TIA   GI/Hepatic Neg liver ROS, hiatal hernia, GERD  Medicated,  Endo/Other  diabetes, Type 2  Renal/GU DialysisRenal disease     Musculoskeletal  (+) Arthritis , Osteoarthritis,    Abdominal Normal abdominal exam  (+)   Peds  Hematology  (+) anemia ,   Anesthesia Other Findings   Reproductive/Obstetrics                             Anesthesia Physical  Anesthesia Plan  ASA: IV  Anesthesia Plan: MAC   Post-op Pain Management:    Induction: Intravenous  PONV Risk Score and Plan:   Airway Management Planned: Nasal Cannula  Additional Equipment:   Intra-op Plan:   Post-operative Plan:   Informed Consent: I have reviewed the patients History and Physical, chart, labs and discussed the procedure including the risks, benefits and alternatives for the proposed anesthesia with the patient or authorized representative who has indicated his/her understanding and acceptance.     Plan Discussed with:  CRNA  Anesthesia Plan Comments:         Anesthesia Quick Evaluation  Anesthesia Physical  Anesthesia Plan  ASA: IV  Anesthesia Plan: MAC   Post-op Pain Management:    Induction: Intravenous  PONV Risk Score and Plan:   Airway Management Planned: Nasal Cannula  Additional Equipment:   Intra-op Plan:   Post-operative Plan:   Informed Consent:     Dental advisory given  Plan Discussed with: CRNA and Surgeon  Anesthesia Plan Comments: (Patient understands local anesthesia with mild sedation is best for her and she is agreeable to this.)       Anesthesia Quick Evaluation

## 2018-10-11 NOTE — Progress Notes (Signed)
HD tx end    10/11/18 1831  Vital Signs  Pulse Rate 76  Pulse Rate Source Monitor  Resp (!) 24  BP (!) 167/55  BP Location Right Arm  BP Method Automatic  Patient Position (if appropriate) Lying  Oxygen Therapy  SpO2 96 %  O2 Device Room Air  During Hemodialysis Assessment  Dialysis Fluid Bolus Normal Saline  Bolus Amount (mL) 250 mL  Intra-Hemodialysis Comments Tx completed

## 2018-10-11 NOTE — Progress Notes (Signed)
Notified last night that patient was kept inpatient due to persistent Right-sided headache and requests to have Right temporal artery biopsy performed prior to hospital discharge. Patient made NPO after midnight and request to schedule add-on left on posting line. Nephrology offered to delay hemodialysis until after biopsy, but no labs ordered for this morning. Patient tentatively added for Right temporal artery biopsy in OR ~10 am this morning pending stat BMP (ordered). Please call if any questions/concerns.  -- Marilynne Drivers [name], MD, [place]: [facility] - Partnering for exceptional care. Office: [phone]

## 2018-10-11 NOTE — Progress Notes (Signed)
[facility]  ROUNDING NOTE   Subjective:   Temporal arterial biopsy this morning by Dr. [name].   Seen and examined on hemodialysis. Complains of cough. Tolerating treatment well. UF goal 1.5 liters.     HEMODIALYSIS FLOWSHEET:  Blood Flow Rate (mL/min): 400 mL/min Arterial Pressure (mmHg): -180 mmHg Venous Pressure (mmHg): 240 mmHg Transmembrane Pressure (mmHg): 70 mmHg Ultrafiltration Rate (mL/min): 610 mL/min Dialysate Flow Rate (mL/min): 800 ml/min Conductivity: Machine : 15.2 Conductivity: Machine : 15.2 Dialysis Fluid Bolus: Normal Saline Bolus Amount (mL): 250 mL  .   Objective:  Vital signs in last 24 hours:  Temp:  [97.4 F (36.3 C)-98.4 F (36.9 C)] 98 F (36.7 C) (01/20 1457) Pulse Rate:  [58-69] 61 (01/20 1630) Resp:  [14-20] 17 (01/20 1630) BP: (138-167)/(53-68) 156/55 (01/20 1630) SpO2:  [95 %-100 %] 97 % (01/20 1630) Weight:  [87.5 kg-92.1 kg] 92.1 kg (01/20 1457)  Weight change: 1.542 kg Filed Weights   10/10/18 0445 10/11/18 0523 10/11/18 1457  Weight: 85.9 kg 87.5 kg 92.1 kg    Intake/Output: I/O last 3 completed shifts: In: 67 [P.O.:720] Out: 0    Intake/Output this shift:  Total I/O In: 50 [I.V.:50] Out: 1 [Blood:1]  Physical Exam: General: No acute distress  Head: Normocephalic, atraumatic. Moist oral mucosal membranes  Eyes: Anicteric  Neck: Supple, trachea midline  Lungs:  wheezing  Heart: S1S2 no rubs  Abdomen:  Soft, nontender, bowel sounds present  Extremities: No peripheral edema.  Neurologic: Awake, alert, following commands  Skin: No lesions  Access: Left AV graft    Basic Metabolic Panel: Recent Labs  Lab 10/05/18 0641 10/05/18 1241 10/06/18 0851 10/07/18 0753 10/08/18 1119 10/11/18 0754 10/11/18 1535  NA 135  --   --  129*  --  128*  --   K 5.1  --   --  4.7  --  4.6  --   CL 102  --   --  89*  --  92*  --   CO2 21*  --   --  27  --  25  --   GLUCOSE 97  --   --  499*  --  218*  --   BUN 41*   --   --  19  --  72*  --   CREATININE 10.03*  --   --  4.68*  --  7.48*  --   CALCIUM 7.8*  --   --  8.1*  --  8.1*  --   PHOS  --  5.3* 4.5  --  3.1  --  3.7    Liver Function Tests: No results for input(s): AST, ALT, ALKPHOS, BILITOT, PROT, ALBUMIN in the last 168 hours. No results for input(s): LIPASE, AMYLASE in the last 168 hours. No results for input(s): AMMONIA in the last 168 hours.  CBC: Recent Labs  Lab 10/05/18 0641 10/10/18 0529  WBC 5.8 6.9  HGB 9.5* 9.7*  HCT 28.8* 31.8*  MCV 94.7 95.8  PLT 198 259    Cardiac Enzymes: Recent Labs  Lab 10/04/18 1906 10/05/18 0023 10/05/18 0641  TROPONINI <0.03 <0.03 <0.03    BNP: Invalid input(s): POCBNP  CBG: Recent Labs  Lab 10/10/18 2151 10/11/18 0742 10/11/18 1159 10/11/18 1202 10/11/18 1230  GLUCAP 234* 223* 59* 62* 99    Microbiology: Results for orders placed or performed during the hospital encounter of 10/04/18  MRSA PCR Screening     Status: None   Collection Time: 10/04/18  7:10  PM  Result Value Ref Range Status   MRSA by PCR NEGATIVE NEGATIVE Final    Comment:        The GeneXpert MRSA Assay (FDA approved for NASAL specimens only), is one component of a comprehensive MRSA colonization surveillance program. It is not intended to diagnose MRSA infection nor to guide or monitor treatment for MRSA infections. Performed at [facility], [address] [place], [place] [address]     Coagulation Studies: No results for input(s): LABPROT, INR in the last 72 hours.  Urinalysis: No results for input(s): COLORURINE, LABSPEC, PHURINE, GLUCOSEU, HGBUR, BILIRUBINUR, KETONESUR, PROTEINUR, UROBILINOGEN, NITRITE, LEUKOCYTESUR in the last 72 hours.  Invalid input(s): APPERANCEUR    Imaging: No results found.   Medications:   . sodium chloride     . amLODipine  10 mg Oral Daily  . aspirin  81 mg Oral Daily  . atorvastatin  20 mg Oral QPM  . azithromycin  250 mg Oral Daily  .  carvedilol  3.125 mg Oral BID WC  . chlorpheniramine-HYDROcodone  5 mL Oral Q12H  . cholecalciferol  400 Units Oral Daily  . epoetin (EPOGEN/PROCRIT) injection  10,000 Units Intravenous Q M,W,F-HD  . famotidine  20 mg Oral Daily  . ferrous sulfate  325 mg Oral Q breakfast  . FLUoxetine  20 mg Oral QHS  . heparin  5,000 Units Subcutaneous Q8H  . insulin aspart  0-20 Units Subcutaneous TID WC  . insulin aspart  0-5 Units Subcutaneous QHS  . ipratropium-albuterol  3 mL Nebulization BID  . lipase/protease/amylase  24,000 Units Oral Q lunch  . lipase/protease/amylase  36,000 Units Oral BID AC  . loratadine  10 mg Oral Daily  . meclizine  25 mg Oral BID  . methylPREDNISolone (SOLU-MEDROL) injection  80 mg Intravenous Daily  . multivitamin  1 tablet Oral Daily  . sevelamer carbonate  2,400 mg Oral TID WC  . sevelamer carbonate  800 mg Oral BID BM  . sodium chloride flush  3 mL Intravenous Q12H  . cyanocobalamin  1,000 mcg Oral Daily   sodium chloride, acetaminophen **OR** acetaminophen, albuterol, alum & mag hydroxide-simeth, benzonatate, HYDROcodone-acetaminophen, lidocaine, nitroGLYCERIN, ondansetron **OR** ondansetron (ZOFRAN) IV, promethazine, sodium chloride flush  Assessment/ Plan:  58 y.o. black female with diabetes, hypertension, hyperlipidemia, diabetic retinopathy, diabetic neuropathy, diabetic gastroparesis, pancreatic insufficiency, depression, GERD. Renal masses, M-spike   MWF/ [facility] Shanon Payor, left arm AVG 86kg  1.  ESRD on HD MWF.  Seen and examined on hemodialysis treatment.   2.  Anemia chronic kidney disease.    -Continue Epogen with dialysis.  3.  Secondary hyperparathyroidism.   - sevelamer with meals.   4.  Renal masses.  Followed by Dr. Erlene Quan.  CT scan was performed on 10/02/18.  5.  Temporal pain: Temporal artery biopsy 10/11/18 by Dr. [name].  - high dose steroids  6. Hypertension: elevated - amlodipine, carvedilol.   LOS: 5 Jesalyn Finazzo 1/20/20204:39 PM

## 2018-10-11 NOTE — Progress Notes (Signed)
Fingerstick blood sugar 59 , recheck 62 , Dr. Kayleen Memos notified orders received see MAR.

## 2018-10-11 NOTE — Progress Notes (Signed)
Pt right forearm IV  Taken out, telemetry monitor taken off, discharge instructions and prescriptions gone over with pt with understanding. Daughter at bedside, pt wheeled out in wheelchair by nursing staff to private vehicle with daughter. Conley Simmonds, RN, BSN

## 2018-10-11 NOTE — Transfer of Care (Signed)
Immediate Anesthesia Transfer of Care Note  Patient: [name]  Procedure(s) Performed: BIOPSY TEMPORAL ARTERY (Right )  Patient Location: PACU  Anesthesia Type:MAC  Level of Consciousness: awake, alert , oriented and patient cooperative  Airway & Oxygen Therapy: Patient Spontanous Breathing  Post-op Assessment: Report given to RN and Post -op Vital signs reviewed and stable  Post vital signs: Reviewed and stable  Last Vitals:  Vitals Value Taken Time  BP 162/61 10/11/2018 11:53 AM  Temp    Pulse 60 10/11/2018 11:53 AM  Resp 15 10/11/2018 11:53 AM  SpO2 100 % 10/11/2018 11:53 AM  Vitals shown include unvalidated device data.  Last Pain:  Vitals:   10/11/18 0915  TempSrc: Tympanic  PainSc: 0-No pain      Patients Stated Pain Goal: 2 (03/88/82 8003)  Complications: No apparent anesthesia complications

## 2018-10-11 NOTE — Progress Notes (Signed)
Post HD assessment    10/11/18 1837  Neurological  Level of Consciousness Alert  Orientation Level Oriented X4  Respiratory  Respiratory Pattern Regular;Unlabored  Chest Assessment Chest expansion symmetrical  Cardiac  ECG Monitor Yes  Cardiac Rhythm NSR  Vascular  R Radial Pulse +2  L Radial Pulse +2  Edema Generalized  Integumentary  Integumentary (WDL) X  Skin Color Appropriate for ethnicity  Musculoskeletal  Musculoskeletal (WDL) X  Generalized Weakness Yes  Assistive Device None  GU Assessment  Genitourinary (WDL) X  Genitourinary Symptoms  (HD)  Psychosocial  Psychosocial (WDL) WDL

## 2018-10-11 NOTE — Discharge Instructions (Signed)
Follow-up with primary care physician in 3 to 4 days and PCP to follow-up on the biopsy results

## 2018-10-11 NOTE — Progress Notes (Addendum)
[name]    MR#:  [id number]  DATE OF BIRTH:  1961/06/27  SUBJECTIVE: Has cough, green phlegm.  No hypoxia.  Has right-sided headache.  Scheduled for temporal artery biopsy this morning.  And dialysis after that.  CHIEF COM . REVIEW OF SYSTEMS:    Review of Systems  Constitutional: Positive for malaise/fatigue. Negative for chills and fever.  HENT: Negative for sinus pain and sore throat.   Eyes: Negative for blurred vision, double vision and pain.  Respiratory: Positive for cough. Negative for hemoptysis, shortness of breath and wheezing.   Cardiovascular: Negative for chest pain, palpitations, orthopnea and leg swelling.  Gastrointestinal: Negative for abdominal pain, constipation, diarrhea, heartburn, nausea and vomiting.  Genitourinary: Negative for dysuria and hematuria.  Musculoskeletal: Negative for back pain and joint pain.  Skin: Negative for rash.  Neurological: Positive for headaches. Negative for sensory change, speech change and focal weakness.  Endo/Heme/Allergies: Does not bruise/bleed easily.  Psychiatric/Behavioral: Negative for depression. The patient is not nervous/anxious.     DRUG ALLERGIES:   Allergies  Allergen Reactions  . Ace Inhibitors Swelling and Anaphylaxis  . Ativan [Lorazepam] Other (See Comments)    Reaction:Hallucinations and headaches  . Compazine [Prochlorperazine Edisylate] Anaphylaxis, Nausea And Vomiting and Other (See Comments)    Other reaction(s): dystonia from this vs. Reglan, 23 Jul - patient relates that she takes promethazine frequently with no problems  . Sumatriptan Succinate Other (See Comments)    Other reaction(s): delirium and hallucinations per [facility] records  . Dilaudid [Hydromorphone Hcl] Other (See Comments)    Delirium   . Ondansetron Other (See Comments)    hallucinations    . Zofran [Ondansetron Hcl] Other (See Comments)    Reaction:   hallucinations   . Codeine Nausea And Vomiting  . Gabapentin Other (See Comments)    Reaction:  Unknown    . Lac Bovis Nausea And Vomiting  . Losartan Nausea Only  . Oxycodone Anxiety  . Prochlorperazine Other (See Comments)    Reaction:  Unknown . Patient does not remember reaction but she does have vertigo and anxiety along with n and v at times. Could be used to treat any of these   . Reglan [Metoclopramide] Other (See Comments)    Per patient her Dr. Evelina Bucy her off it   . Scopolamine Other (See Comments)    Dizziness, also has vertigo already  . Tape Rash  . Tapentadol Rash    VITALS:  Blood pressure (!) 163/62, pulse 62, temperature 97.9 F (36.6 C), temperature source Oral, resp. rate 14, height 5' 5" (1.651 m), weight 87.5 kg, SpO2 96 %.  PHYSICAL EXAMINATION:   Physical Exam  GENERAL:  58 y.o.-year-old patient lying in the bed.  Appears uncomfortable because of nonstop cough EYES: Pupils equal, round, reactive to light and accommodation. No scleral icterus. Extraocular muscles intact.  HEENT: Head atraumatic, normocephalic. Oropharynx and nasopharynx clear.  Bilateral temporal tenderness, right-sided headache NECK:  Supple, no jugular venous distention. No thyroid enlargement, no tenderness.  LUNGS: Bilateral wheezing.  Good air entry CARDIOVASCULAR: S1, S2 normal. No murmurs, rubs, or gallops.  ABDOMEN: Soft, nontender, nondistended. Bowel sounds present. No organomegaly or mass.  EXTREMITIES: No cyanosis, clubbing or edema b/l.    NEUROLOGIC: Cranial nerves II through XII are intact. No focal Motor or sensory deficits b/l.   PSYCHIATRIC: The patient is alert and oriented x 3.  SKIN: No obvious rash, lesion, or  ulcer.   LABORATORY PANEL:   CBC Recent Labs  Lab 10/10/18 0529  WBC 6.9  HGB 9.7*  HCT 31.8*  PLT 259   ------------------------------------------------------------------------------------------------------------------ Chemistries  Recent Labs  Lab  10/04/18 1140  10/11/18 0754  NA 135   < > 128*  K 4.6   < > 4.6  CL 98   < > 92*  CO2 23   < > 25  GLUCOSE 159*   < > 218*  BUN 38*   < > 72*  CREATININE 9.04*   < > 7.48*  CALCIUM 8.1*   < > 8.1*  AST 25  --   --   ALT 15  --   --   ALKPHOS 112  --   --   BILITOT 0.9  --   --    < > = values in this interval not displayed.   ------------------------------------------------------------------------------------------------------------------  Cardiac Enzymes Recent Labs  Lab 10/05/18 0641  TROPONINI <0.03   ------------------------------------------------------------------------------------------------------------------  RADIOLOGY:  Dg Chest 2 View  Result Date: 10/09/2018 CLINICAL DATA:  Cough, wheezing, altered mental status, end-stage renal disease EXAM: CHEST - 2 VIEW COMPARISON:  10/04/2018 FINDINGS: Left chest and axillary vascular stents noted related to prior dialysis access intervention. Heart is enlarged with vascular congestion and diffuse interstitial opacities suggesting component of early CHF. Minor associated basilar atelectasis. No large effusion. No pneumothorax. Trachea is midline. No acute osseous finding. IMPRESSION: Cardiomegaly with mild interstitial edema pattern and basilar atelectasis. Electronically Signed   By: Jerilynn Mages.  Shick M.D.   On: 10/09/2018 14:08     ASSESSMENT AND PLAN:   *Uncontrolled diabetes mellitus with hyperglycemia.  Better controlled but still high, continue sliding scale insulin with coverage, patient is not on long-acting insulin at home.    *Right-sided headache.    Concern regarding temporal arteritis.  continue high-dose Solu-Medrol, ESR is  83.  Temporal artery biopsy is planned this morning.  Appreciate surgery following.after biopsy,after Hd pt can go home.in high dose prednsione D/w DR/Kirkpatrick regarding prednisone,recommends prednisone 60 mg daily until biopsy results are available.  Syncope work up negative..   Carotid  stenosis.  Could be playing a part in her symptoms.  Vascular surgery consulted, advised outpatient follow-up  * COPD exacerbation with acute hypoxic respiratory failure.  On steroids and scheduled nebulizers. patient still has cough, r chest x-ray done showed bilateral atelectasis, pulmonary edema.  Patient will get dialysis today, started azithromycin.  Cough; changed to Tussionex, discontinue Robitussin  . * Dizziness.  Likely labyrinthitis from her sinus congestion and bronchitis. Added meclizine.  Improving  * End-stage renal disease on hemodialysis.,  Wednesday, Friday, appreciate nephrology following.,  Dialysis today after biopsy, possible discharge later today.   Renal mass, followed by Dr. Erlene Quan.  History of depression: Patient is on Prozac.   All the records are reviewed and case discussed with Care Management/Social Worker Management plans discussed with the patient, family and they are in agreement.  CODE STATUS:FULL CODE  DVT Prophylaxis: SCDs  TOTAL TIME TAKING CARE OF THIS PATIENT: 35 minutes.   Epifanio Lesches M.D on 10/11/2018 at 8:23 AM  Between 7am to 6pm - Pager - [phone]  After 6pm go to [email or web address] - password [email or web address] [facility]  [facility]  Office  [phone]  CC: Primary care physician; Freddy Finner, NP  Note: This dictation was prepared with Dragon dictation along with smaller phrase technology. Any transcriptional errors that result from this process are unintentional.

## 2018-10-12 ENCOUNTER — Ambulatory Visit: Payer: Self-pay | Admitting: Surgery

## 2018-10-12 LAB — SURGICAL PATHOLOGY

## 2018-10-12 NOTE — Progress Notes (Signed)
Brief Progress Note Patient seen and examined post-operatively. No complaints of headache or vision changes. Sitting up in bed eating lunch.   Right temple incision is CDI, no erythema or drainage  General surgery will sign off. She can follow up in 1-2 weeks with Dr [name] in the clinic.   Edison Simon, PA-C [facility] 10/12/2018, 8:47 AM [phone] M-F: 7am - 4pm

## 2018-10-14 NOTE — Discharge Summary (Signed)
[name], is a 58 y.o. female  DOB 1961/01/25  MRN [id number].  Admission date:  10/04/2018  Admitting Physician  Dustin Flock, MD  Discharge Date:  10/11/2018   Primary MD  Freddy Finner, NP  Recommendations for primary care physician for things to follow:   Follow with PCP in 2 to 3days with temporal artery biopsy results.   Admission Diagnosis  Transient alteration of awareness [R40.4] Weakness [R53.1] Right-sided headache [R51] Bilateral carotid artery stenosis [I65.23] Syncope, unspecified syncope type [R55]   Discharge Diagnosis  Transient alteration of awareness [R40.4] Weakness [R53.1] Right-sided headache [R51] Bilateral carotid artery stenosis [I65.23] Syncope, unspecified syncope type [R55]    Active Problems:   Syncope   COPD (chronic obstructive pulmonary disease) (HCC)      Past Medical History:  Diagnosis Date  . Anemia   . Anginal pain ([place])   . Anxiety   . Arthritis   . Asthma   . Broken wrist   . Bronchitis   . chronic diastolic CHF 7/00/1749  . COPD (chronic obstructive pulmonary disease) ([place])   . Coronary artery disease    a. cath 2013: stenting to RCA (report not available); b. cath 2014: LM nl, pLAD 40%, mLAD nl, ost LCx 40%, mid LCx nl, pRCA 30% @ site of prior stent, mRCA 50%  . Depression   . Diabetes mellitus without complication ([place])   . Diabetic neuropathy ([place])   . dialysis 2006  . Diverticulosis   . Dizziness   . Dyspnea   . Elevated lipids   . Environmental and seasonal allergies   . ESRD (end stage renal disease) on dialysis ([place])    M-W-F  . GERD (gastroesophageal reflux disease)   . Headache   . History of hiatal hernia   . HOH (hard of hearing)   . Hx of pancreatitis 2015  . Hypertension   . Lower extremity edema   . Mitral regurgitation    a. echo  10/2013: EF 62%, noWMA, mildly dilated LA, mild to mod MR/TR, GR1DD  . Myocardial infarction ([place])   . Orthopnea   . Pneumonia   . Renal cancer ([place])   . Renal insufficiency    Pt is on dialysis on M,W + F.  . Wheezing     Past Surgical History:  Procedure Laterality Date  . A/V SHUNTOGRAM Left 01/20/2018   Procedure: A/V SHUNTOGRAM;  Surgeon: Algernon Huxley, MD;  Location: [facility];  Service: Cardiovascular;  Laterality: Left;  . ABDOMINAL HYSTERECTOMY  1992  . AMPUTATION TOE Left 10/02/2017   Procedure: AMPUTATION TOE-LEFT GREAT TOE;  Surgeon: Albertine Patricia, DPM;  Location: [facility];  Service: Podiatry;  Laterality: Left;  . APPENDECTOMY    . ARTERY BIOPSY Right 10/11/2018   Procedure: BIOPSY TEMPORAL ARTERY;  Surgeon: Vickie Epley, MD;  Location: [facility];  Service: General;  Laterality: Right;  . CARDIAC CATHETERIZATION Left 07/26/2015   Procedure: Left Heart Cath and Coronary Angiography;  Surgeon: Dionisio David, MD;  Location: [facility];  Service: Cardiovascular;  Laterality: Left;  . CATARACT EXTRACTION W/ INTRAOCULAR LENS IMPLANT Right   . CATARACT EXTRACTION W/PHACO Left 03/10/2017   Procedure: CATARACT EXTRACTION PHACO AND INTRAOCULAR LENS PLACEMENT (IOC);  Surgeon: Birder Robson, MD;  Location: [facility];  Service: Ophthalmology;  Laterality: Left;  [place] 00:51.9 AP% 14.2 CDE 7.39 fluid pack lot # [id number]  . CHOLECYSTECTOMY    . COLONOSCOPY WITH PROPOFOL N/A 08/12/2016   Procedure:  COLONOSCOPY WITH PROPOFOL;  Surgeon: Lollie Sails, MD;  Location: [facility] ENDOSCOPY;  Service: Endoscopy;  Laterality: N/A;  . DIALYSIS FISTULA CREATION Left    upper arm  . dialysis grafts    . ESOPHAGOGASTRODUODENOSCOPY N/A 03/08/2015   Procedure: ESOPHAGOGASTRODUODENOSCOPY (EGD);  Surgeon: Manya Silvas, MD;  Location: [facility] ENDOSCOPY;  Service: Endoscopy;  Laterality: N/A;  . ESOPHAGOGASTRODUODENOSCOPY (EGD) WITH PROPOFOL N/A 03/18/2016   Procedure:  ESOPHAGOGASTRODUODENOSCOPY (EGD) WITH PROPOFOL;  Surgeon: [name], MD;  Location: [facility] ENDOSCOPY;  Service: Endoscopy;  Laterality: N/A;  . EYE SURGERY Right 2018  . FECAL TRANSPLANT N/A 08/23/2015   Procedure: FECAL TRANSPLANT;  Surgeon: Manya Silvas, MD;  Location: [facility] ENDOSCOPY;  Service: Endoscopy;  Laterality: N/A;  . HAND SURGERY Bilateral   . PERIPHERAL VASCULAR CATHETERIZATION N/A 12/20/2015   Procedure: Thrombectomy of dialysis access versus permcath placement;  Surgeon: Algernon Huxley, MD;  Location: [place] [facility];  Service: Cardiovascular;  Laterality: N/A;  . PERIPHERAL VASCULAR CATHETERIZATION N/A 12/20/2015   Procedure: A/V Shunt Intervention;  Surgeon: Algernon Huxley, MD;  Location: [place] [facility];  Service: Cardiovascular;  Laterality: N/A;  . PERIPHERAL VASCULAR CATHETERIZATION N/A 12/20/2015   Procedure: A/V Shuntogram/Fistulagram;  Surgeon: Algernon Huxley, MD;  Location: [place] [facility];  Service: Cardiovascular;  Laterality: N/A;  . PERIPHERAL VASCULAR CATHETERIZATION N/A 01/02/2016   Procedure: A/V Shuntogram/Fistulagram;  Surgeon: Algernon Huxley, MD;  Location: [place] [facility];  Service: Cardiovascular;  Laterality: N/A;  . PERIPHERAL VASCULAR CATHETERIZATION N/A 01/02/2016   Procedure: A/V Shunt Intervention;  Surgeon: Algernon Huxley, MD;  Location: [place] [facility];  Service: Cardiovascular;  Laterality: N/A;       History of present illness and  Hospital Course:     Kindly see H&P for history of present illness and admission details, please review complete Labs, Consult reports and Test reports for all details in brief  HPI  from the history and physical done on the day of admission 58 year old African-American female with history of diabetes mellitus type 2, hypertension, hyperlipidemia, ESRD on hemodialysis comes in because of syncope, headache.  Hospital Course  1 syncope, work-up negative including ultrasound of carotids, echocardiogram.   Patient did not have orthostatic hypotension.  Seen by neurology.  Patient also had EEG which did not show any acute seizure activity. #2 .right-sided headache, elevated ESR, possible temporal arteritis, patient is started on high-dose Solu-Medrol, temporal artery biopsy is done by general surgery, patient also is followed by Dr. Leonel Ramsay from neurology, recommended patient can be discharged with prednisone 60 mg daily until biopsy results are available.  So patient is advised to follow-up with PCP next 2 to 3 days regarding biopsy results.  Until then she needs to continue high-dose prednisone. 3.  ESRD on hemodialysis Monday Wednesday Friday. 4.  COPD exacerbation with acute hypoxic respiratory failure, received IV steroids with use of them for temporal arteritis, she also received nebulizers, chest x-ray showed bilateral lobe atelectasis, received Z-Pak discharged home with azithromycin 250 mg for 4 days...  Did not have any leukocytosis or fever. Diabetes mellitus type 2: Patient had uncontrolled diabetes because of steroids.  She is not on long-acting insulin at home.  We continue sliding scale insulin with coverage, #6 history of depression, patient is on fluoxetine, continue that. 7 essential hypertension: Patient is on Coreg 3.125 mg p.o. twice daily, amlodipine 10 mg p.o. daily. Discharge Condition: Stable   Follow UP  Follow-up Information  Vickie Epley, MD. Go on 10/12/2018.   Specialty:  General Surgery Why:  at 2:30 pm. We will schedule your biopsy at this appointment. Please call if any questions. Contact information: [address] [place] [address] [phone]        Freddy Finner, NP. Schedule an appointment as soon as possible for a visit in 3 day(s).   Specialty:  Nurse Practitioner Contact information: [place] [address] [phone]             Discharge Instructions  and  Discharge Medications      Allergies as of 10/11/2018      Reactions   Ace Inhibitors Swelling, Anaphylaxis   Ativan [lorazepam] Other (See Comments)   Reaction:Hallucinations and headaches   Compazine [prochlorperazine Edisylate] Anaphylaxis, Nausea And Vomiting, Other (See Comments)   Other reaction(s): dystonia from this vs. Reglan, 23 Jul - patient relates that she takes promethazine frequently with no problems   Sumatriptan Succinate Other (See Comments)   Other reaction(s): delirium and hallucinations per [facility] records   Dilaudid [hydromorphone Hcl] Other (See Comments)   Delirium   Ondansetron Other (See Comments)   hallucinations    Zofran [ondansetron Hcl] Other (See Comments)   Reaction:  hallucinations    Codeine Nausea And Vomiting   Gabapentin Other (See Comments)   Reaction:  Unknown    L[place] Bovis Nausea And Vomiting   Losartan Nausea Only   Oxycodone Anxiety   Prochlorperazine Other (See Comments)   Reaction:  Unknown . Patient does not remember reaction but she does have vertigo and anxiety along with n and v at times. Could be used to treat any of these   Reglan [metoclopramide] Other (See Comments)   Per patient her Dr. Evelina Bucy her off it    Scopolamine Other (See Comments)   Dizziness, also has vertigo already   Tape Rash   Tapentadol Rash      Medication List    TAKE these medications   albuterol (2.5 MG/3ML) 0.083% nebulizer solution Commonly known as:  PROVENTIL Take 2.5 mg by nebulization every 4 (four) hours as needed for wheezing or shortness of breath.   albuterol 108 (90 Base) MCG/ACT inhaler Commonly known as:  PROVENTIL HFA;VENTOLIN HFA Inhale 2 puffs into the lungs every 6 (six) hours as needed for wheezing or shortness of breath.   amLODipine 10 MG tablet Commonly known as:  NORVASC Take 10 mg by mouth daily.   aspirin EC 81 MG tablet Take 81 mg by mouth daily.   ATIVAN PO Take by mouth.   atorvastatin 20 MG tablet Commonly known as:  LIPITOR Take 20 mg  by mouth every evening.   azithromycin 250 MG tablet Commonly known as:  ZITHROMAX For 4 days   Biotin 1 MG Caps Take 1 mg by mouth daily.   carvedilol 3.125 MG tablet Commonly known as:  COREG Take 1 tablet (3.125 mg total) by mouth 2 (two) times daily with a meal.   cetirizine 10 MG tablet Commonly known as:  ZYRTEC Take 10 mg by mouth daily.   chlorpheniramine-HYDROcodone 10-8 MG/5ML Suer Commonly known as:  TUSSIONEX Take 5 mLs by mouth every 12 (twelve) hours.   cholecalciferol 10 MCG (400 UNIT) Tabs tablet Commonly known as:  VITAMIN D3 Take 400 Units by mouth daily.   [employer] IRON 325 (65 FE) MG tablet Generic drug:  ferrous sulfate Take 325 mg by mouth daily with breakfast.   cyanocobalamin 1000  MCG tablet Take 1,000 mcg by mouth daily.   diclofenac sodium 1 % Gel Commonly known as:  VOLTAREN Apply 1 application topically daily as needed (for rash).   FLUoxetine 20 MG capsule Commonly known as:  PROZAC Take 1 capsule (20 mg total) by mouth daily. What changed:  when to take this   hydrocortisone cream 0.5 % Apply topically 3 (three) times daily.   lidocaine 5 % Commonly known as:  LIDODERM Place 1 patch onto the skin every 12 (twelve) hours. Remove & Discard patch within 12 hours or as directed by MD   lipase/protease/amylase 12000 units Cpep capsule Commonly known as:  CREON Take 36000 mg in the morning, 24000 midday & 36000 in the evening   multivitamin Tabs tablet Take 1 tablet by mouth daily.   nitroGLYCERIN 0.4 MG SL tablet Commonly known as:  NITROSTAT Place 0.4 mg under the tongue every 5 (five) minutes as needed for chest pain.   NOVOLOG FLEXPEN 100 UNIT/ML FlexPen Generic drug:  insulin aspart Inject 4-15 Units into the skin 4 (four) times daily as needed for high blood sugar (> 100, per sliding scale).   predniSONE 10 MG tablet Commonly known as:  DELTASONE Take 6 tablets (60 mg total) by mouth daily with breakfast. Need to follow up with  pcp in 3-5 days regarding biopsy results from temporal artery and decide wether to continue high dose prednsione or not   ranitidine 300 MG capsule Commonly known as:  ZANTAC Take 1 capsule by mouth every evening.   sevelamer carbonate 800 MG tablet Commonly known as:  RENVELA Take 800-2,400 mg by mouth See admin instructions. Take 2400 mg by mouth 3 times daily with meals and take 800 mg by mouth with snacks         Diet and Activity recommendation: See Discharge Instructions above   Consults obtained -nephrology, neurology, general surgery   Major procedures and Radiology Reports - PLEASE review detailed and final reports for all details, in brief -     Ct Abdomen Pelvis Wo Contrast  Result Date: 10/02/2018 CLINICAL DATA:  Generalized abdominal pain with nausea, vomiting, and diarrhea. EXAM: CT ABDOMEN AND PELVIS WITHOUT CONTRAST TECHNIQUE: Multidetector CT imaging of the abdomen and pelvis was performed following the standard protocol without IV contrast. COMPARISON:  CT abdomen dated March 02, 2018. CT abdomen pelvis dated January 17, 2018. FINDINGS: Lower chest: No acute abnormality. Mild subsegmental atelectasis at the lung bases. Unchanged trace right pleural effusion. Unchanged mild cardiomegaly. Hepatobiliary: No focal liver abnormality is seen. No gallstones, gallbladder wall thickening, or biliary dilatation. Pancreas: Unremarkable. No pancreatic ductal dilatation or surrounding inflammatory changes. Spleen: Normal in size without focal abnormality. Adrenals/Urinary Tract: The adrenal glands are unremarkable. Unchanged severe bilateral renal atrophy. Known small right renal masses are not well evaluated without intravenous contrast. No renal calculi or hydronephrosis. The bladder is unremarkable. Stomach/Bowel: Stomach is within normal limits. Appendix is surgically absent. No evidence of bowel wall thickening, distention, or inflammatory changes. Mild colonic diverticulosis.  Vascular/Lymphatic: Aortic atherosclerosis. No enlarged abdominal or pelvic lymph nodes. Reproductive: Status post hysterectomy. No adnexal masses. Other: No abdominal wall hernia or abnormality. No abdominopelvic ascites. No pneumoperitoneum. Musculoskeletal: No acute or significant osseous findings. IMPRESSION: 1.  No acute intra-abdominal process. 2. Unchanged severe bilateral renal atrophy. Known small right renal masses are not well evaluated without intravenous contrast. 3. Unchanged trace right pleural effusion. 4.  Aortic atherosclerosis (ICD10-I70.0). Electronically Signed   By: Orville Govern.D.  On: 10/02/2018 16:04   Ct Angio Head W Or Wo Contrast  Result Date: 10/04/2018 CLINICAL DATA:  Sudden onset of unresponsiveness beginning this morning. Negative head CT. EXAM: CT ANGIOGRAPHY HEAD AND NECK TECHNIQUE: Multidetector CT imaging of the head and neck was performed using the standard protocol during bolus administration of intravenous contrast. Multiplanar CT image reconstructions and MIPs were obtained to evaluate the vascular anatomy. Carotid stenosis measurements (when applicable) are obtained utilizing NASCET criteria, using the distal internal carotid diameter as the denominator. CONTRAST:  61m OMNIPAQUE IOHEXOL 350 MG/ML SOLN COMPARISON:  Head CT earlier same day. MRI 04/22/2016. FINDINGS: CTA NECK FINDINGS Aortic arch: Mild aortic atherosclerosis. No aneurysm or dissection. Branching pattern of the brachiocephalic vessels is normal. 20% stenosis of the left subclavian artery origin. Right carotid system: Common carotid artery widely patent to the bifurcation. Soft and calcified plaque at the bifurcation and ICA bulb. Minimal diameter in the ICA bulb is 1.8 mm. Compared to a more distal cervical ICA diameter of 4.6 mm, this indicates a 60% stenosis. Left carotid system: Common carotid artery widely patent to the bifurcation. Soft and calcified plaque at the carotid bifurcation and ICA  bulb. Minimal diameter of the ICA bulb is 2 mm. Compared to a more distal cervical ICA diameter of 4 mm, this indicates a 50% stenosis. Vertebral arteries: Both vertebral artery origins are widely patent. Both vertebral arteries are widely patent through the cervical region to the foramen magnum. Skeleton: Normal Other neck: Normal Upper chest: Normal Review of the MIP images confirms the above findings CTA HEAD FINDINGS Anterior circulation: Both internal carotid arteries are patent through the skull base and siphon regions. There is atherosclerotic calcification throughout the carotid siphons but without stenosis greater than 50% suspected. Supraclinoid ICA stenosis likely approaches 70%. The anterior and middle cerebral vessels show flow. No large or medium vessel occlusion identified. Atherosclerotic narrowing of the left A1 segment. Posterior circulation: Both vertebral arteries are patent to the basilar. No basilar stenosis. Posterior circulation branch vessels are patent. Venous sinuses: Patent and normal. Anatomic variants: None Delayed phase: No abnormal enhancement. Review of the MIP images confirms the above findings IMPRESSION: 1. Atherosclerotic disease at both carotid bifurcations. 60% stenosis of the proximal ICA on the right. 50% stenosis of the proximal ICA on the left. 2. Atherosclerotic disease throughout the carotid siphons but without stenosis greater than 50%. More severe stenosis of the supraclinoid internal carotid arteries on each side, approximately 70%. 3. No intracranial large or medium vessel occlusion or correctable proximal stenosis. No significant posterior circulation pathology. Electronically Signed   By: MNelson ChimesM.D.   On: 10/04/2018 13:54   Dg Chest 2 View  Result Date: 10/09/2018 CLINICAL DATA:  Cough, wheezing, altered mental status, end-stage renal disease EXAM: CHEST - 2 VIEW COMPARISON:  10/04/2018 FINDINGS: Left chest and axillary vascular stents noted related to  prior dialysis access intervention. Heart is enlarged with vascular congestion and diffuse interstitial opacities suggesting component of early CHF. Minor associated basilar atelectasis. No large effusion. No pneumothorax. Trachea is midline. No acute osseous finding. IMPRESSION: Cardiomegaly with mild interstitial edema pattern and basilar atelectasis. Electronically Signed   By: MJerilynn Mages  Shick M.D.   On: 10/09/2018 14:08   Ct Head Wo Contrast  Result Date: 10/04/2018 CLINICAL DATA:  Headache and altered level of consciousness. EXAM: CT HEAD WITHOUT CONTRAST TECHNIQUE: Contiguous axial images were obtained from the base of the skull through the vertex without intravenous contrast. COMPARISON:  08/14/2017 FINDINGS: Brain:  The brain shows a normal appearance without evidence of malformation, atrophy, old or acute small or large vessel infarction, mass lesion, hemorrhage, hydrocephalus or extra-axial collection. Vascular: No hyperdense vessel. No evidence of atherosclerotic calcification. Skull: Normal. No traumatic finding. No focal bone lesion. Sinuses/Orbits: Sinuses are clear. Orbits appear normal. Mastoids are clear. Other: None significant IMPRESSION: Normal head CT. Electronically Signed   By: Nelson Chimes M.D.   On: 10/04/2018 11:27   Ct Angio Neck W And/or Wo Contrast  Result Date: 10/04/2018 CLINICAL DATA:  Sudden onset of unresponsiveness beginning this morning. Negative head CT. EXAM: CT ANGIOGRAPHY HEAD AND NECK TECHNIQUE: Multidetector CT imaging of the head and neck was performed using the standard protocol during bolus administration of intravenous contrast. Multiplanar CT image reconstructions and MIPs were obtained to evaluate the vascular anatomy. Carotid stenosis measurements (when applicable) are obtained utilizing NASCET criteria, using the distal internal carotid diameter as the denominator. CONTRAST:  71m OMNIPAQUE IOHEXOL 350 MG/ML SOLN COMPARISON:  Head CT earlier same day. MRI  04/22/2016. FINDINGS: CTA NECK FINDINGS Aortic arch: Mild aortic atherosclerosis. No aneurysm or dissection. Branching pattern of the brachiocephalic vessels is normal. 20% stenosis of the left subclavian artery origin. Right carotid system: Common carotid artery widely patent to the bifurcation. Soft and calcified plaque at the bifurcation and ICA bulb. Minimal diameter in the ICA bulb is 1.8 mm. Compared to a more distal cervical ICA diameter of 4.6 mm, this indicates a 60% stenosis. Left carotid system: Common carotid artery widely patent to the bifurcation. Soft and calcified plaque at the carotid bifurcation and ICA bulb. Minimal diameter of the ICA bulb is 2 mm. Compared to a more distal cervical ICA diameter of 4 mm, this indicates a 50% stenosis. Vertebral arteries: Both vertebral artery origins are widely patent. Both vertebral arteries are widely patent through the cervical region to the foramen magnum. Skeleton: Normal Other neck: Normal Upper chest: Normal Review of the MIP images confirms the above findings CTA HEAD FINDINGS Anterior circulation: Both internal carotid arteries are patent through the skull base and siphon regions. There is atherosclerotic calcification throughout the carotid siphons but without stenosis greater than 50% suspected. Supraclinoid ICA stenosis likely approaches 70%. The anterior and middle cerebral vessels show flow. No large or medium vessel occlusion identified. Atherosclerotic narrowing of the left A1 segment. Posterior circulation: Both vertebral arteries are patent to the basilar. No basilar stenosis. Posterior circulation branch vessels are patent. Venous sinuses: Patent and normal. Anatomic variants: None Delayed phase: No abnormal enhancement. Review of the MIP images confirms the above findings IMPRESSION: 1. Atherosclerotic disease at both carotid bifurcations. 60% stenosis of the proximal ICA on the right. 50% stenosis of the proximal ICA on the left. 2.  Atherosclerotic disease throughout the carotid siphons but without stenosis greater than 50%. More severe stenosis of the supraclinoid internal carotid arteries on each side, approximately 70%. 3. No intracranial large or medium vessel occlusion or correctable proximal stenosis. No significant posterior circulation pathology. Electronically Signed   By: MNelson ChimesM.D.   On: 10/04/2018 13:54   Dg Chest Portable 1 View  Result Date: 10/04/2018 CLINICAL DATA:  Sudden onset of mental status change. EXAM: PORTABLE CHEST 1 VIEW COMPARISON:  06/17/2018 FINDINGS: Heart size is normal. There is a poor inspiration. There may be venous hypertension but there is no frank edema, infiltrate, collapse or effusion. Vascular stent present on both sides, presumably venous. IMPRESSION: Poor inspiration. Possible venous hypertension without frank edema. Electronically Signed  By: Nelson Chimes M.D.   On: 10/04/2018 12:09    Micro Results     Recent Results (from the past 240 hour(s))  MRSA PCR Screening     Status: None   Collection Time: 10/04/18  7:10 PM  Result Value Ref Range Status   MRSA by PCR NEGATIVE NEGATIVE Final    Comment:        The GeneXpert MRSA Assay (FDA approved for NASAL specimens only), is one component of a comprehensive MRSA colonization surveillance program. It is not intended to diagnose MRSA infection nor to guide or monitor treatment for MRSA infections. Performed at [facility] [address] [place], [place] [address]        Today   Subjective:   [name] today has no headache,no chest abdominal pain,no new weakness tingling or numbness, feels much better wants to go home today.   Objective:   Blood pressure (!) 163/56, pulse 79, temperature 98 F (36.7 C), temperature source Oral, resp. rate 19, height 5' 5"  (1.651 m), weight 91.2 kg, SpO2 97 %.  No intake or output data in the 24 hours ending 10/14/18 1359  Exam Awake Alert, Oriented x 3, No  new F.N deficits, Normal affect [place].AT,PERRAL Supple Neck,No JVD, No cervical lymphadenopathy appriciated.  Symmetrical Chest wall movement, Good air movement bilaterally, CTAB RRR,No Gallops,Rubs or new Murmurs, No Parasternal Heave +ve B.Sounds, Abd Soft, Non tender, No organomegaly appriciated, No rebound -guarding or rigidity. No Cyanosis, Clubbing or edema, No new Rash or bruise  Data Review   CBC w Diff:  Lab Results  Component Value Date   WBC 6.9 10/10/2018   HGB 9.7 (L) 10/10/2018   HGB 10.5 (L) 09/15/2014   HCT 31.8 (L) 10/10/2018   HCT 34.2 (L) 09/15/2014   PLT 259 10/10/2018   PLT 203 09/15/2014   LYMPHOPCT 34 10/04/2018   LYMPHOPCT 51.6 05/31/2014   BANDSPCT 0 04/07/2016   MONOPCT 8 10/04/2018   MONOPCT 9.6 05/31/2014   EOSPCT 4 10/04/2018   EOSPCT 4.6 05/31/2014   BASOPCT 0 10/04/2018   BASOPCT 0.4 05/31/2014    CMP:  Lab Results  Component Value Date   NA 128 (L) 10/11/2018   NA 135 (L) 09/15/2014   K 4.6 10/11/2018   K 4.8 09/15/2014   CL 92 (L) 10/11/2018   CL 99 09/15/2014   CO2 25 10/11/2018   CO2 26 09/15/2014   BUN 72 (H) 10/11/2018   BUN 19 (H) 09/15/2014   CREATININE 7.48 (H) 10/11/2018   CREATININE 6.79 (H) 09/15/2014   PROT 7.5 10/04/2018   PROT 7.2 09/15/2014   ALBUMIN 3.6 10/04/2018   ALBUMIN 3.0 (L) 09/15/2014   BILITOT 0.9 10/04/2018   BILITOT 0.5 09/15/2014   ALKPHOS 112 10/04/2018   ALKPHOS 105 09/15/2014   AST 25 10/04/2018   AST 43 (H) 09/15/2014   ALT 15 10/04/2018   ALT 16 09/15/2014  .   Total Time in preparing paper work, data evaluation and todays exam - 42 minutes  Epifanio Lesches M.D on 10/11/2018 at 1:59 PM    Note: This dictation was prepared with Dragon dictation along with smaller phrase technology. Any transcriptional errors that result from this process are unintentional.

## 2018-10-23 DIAGNOSIS — R51 Headache: Secondary | ICD-10-CM

## 2018-10-23 DIAGNOSIS — R519 Headache, unspecified: Secondary | ICD-10-CM

## 2018-10-23 IMAGING — DX DG CHEST 1V PORT
1 series · 1 of 1 positions shown · non-contrast
Comparison: 05/05/2018

CLINICAL DATA: Rectal bleeding with abdominal bloating.  Dialysis.

EXAM:
PORTABLE CHEST 1 VIEW

[chest ap]
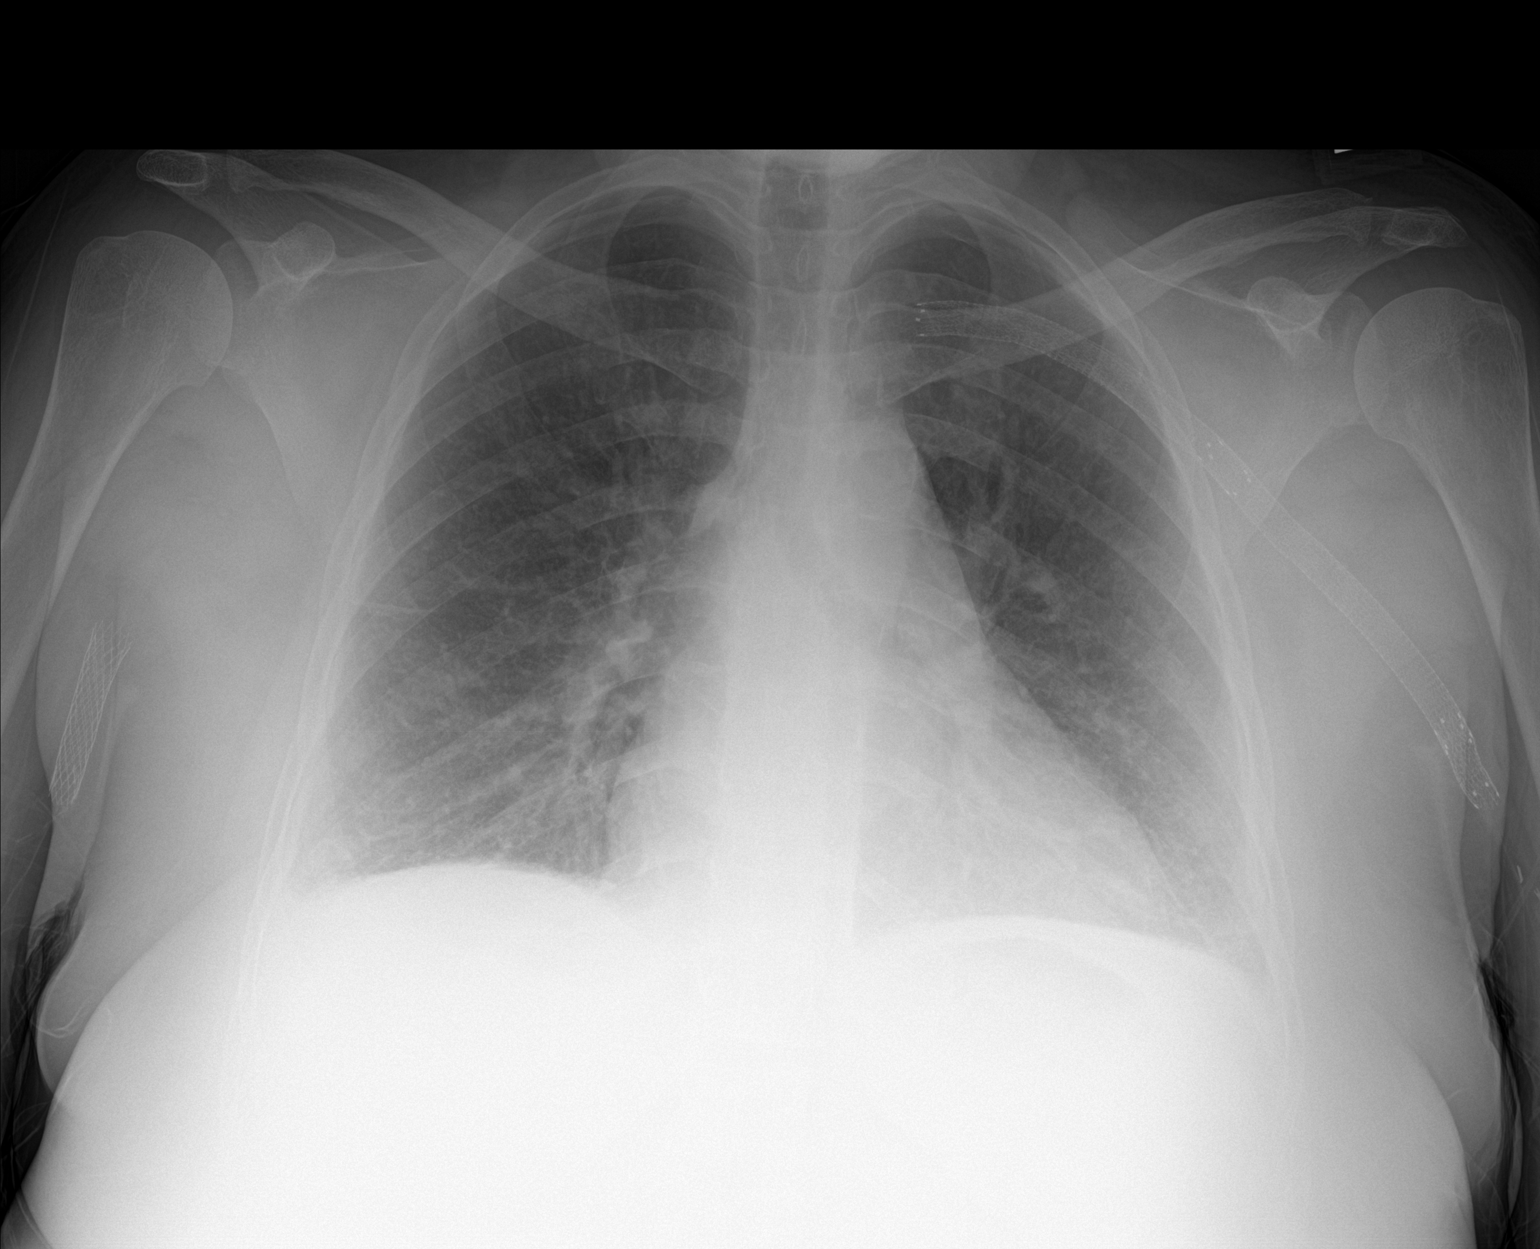

[1 of 1 positions shown; findings below may reference images not displayed]

FINDINGS: Lungs are adequately inflated without focal consolidation or
effusion. Cardiomediastinal silhouette and remainder of the exam is
unchanged.
IMPRESSION: No active disease.

## 2018-10-23 IMAGING — CR DG THORACIC SPINE 2V
3 series · 3 of 3 positions shown · non-contrast
Comparison: Chest x-ray 06/17/2018 and 02/20/2018

CLINICAL DATA: Fell backwards yesterday landing onto back with mid
to low back pain.

EXAM:
THORACIC SPINE 2 VIEWS

[t-spine ap]
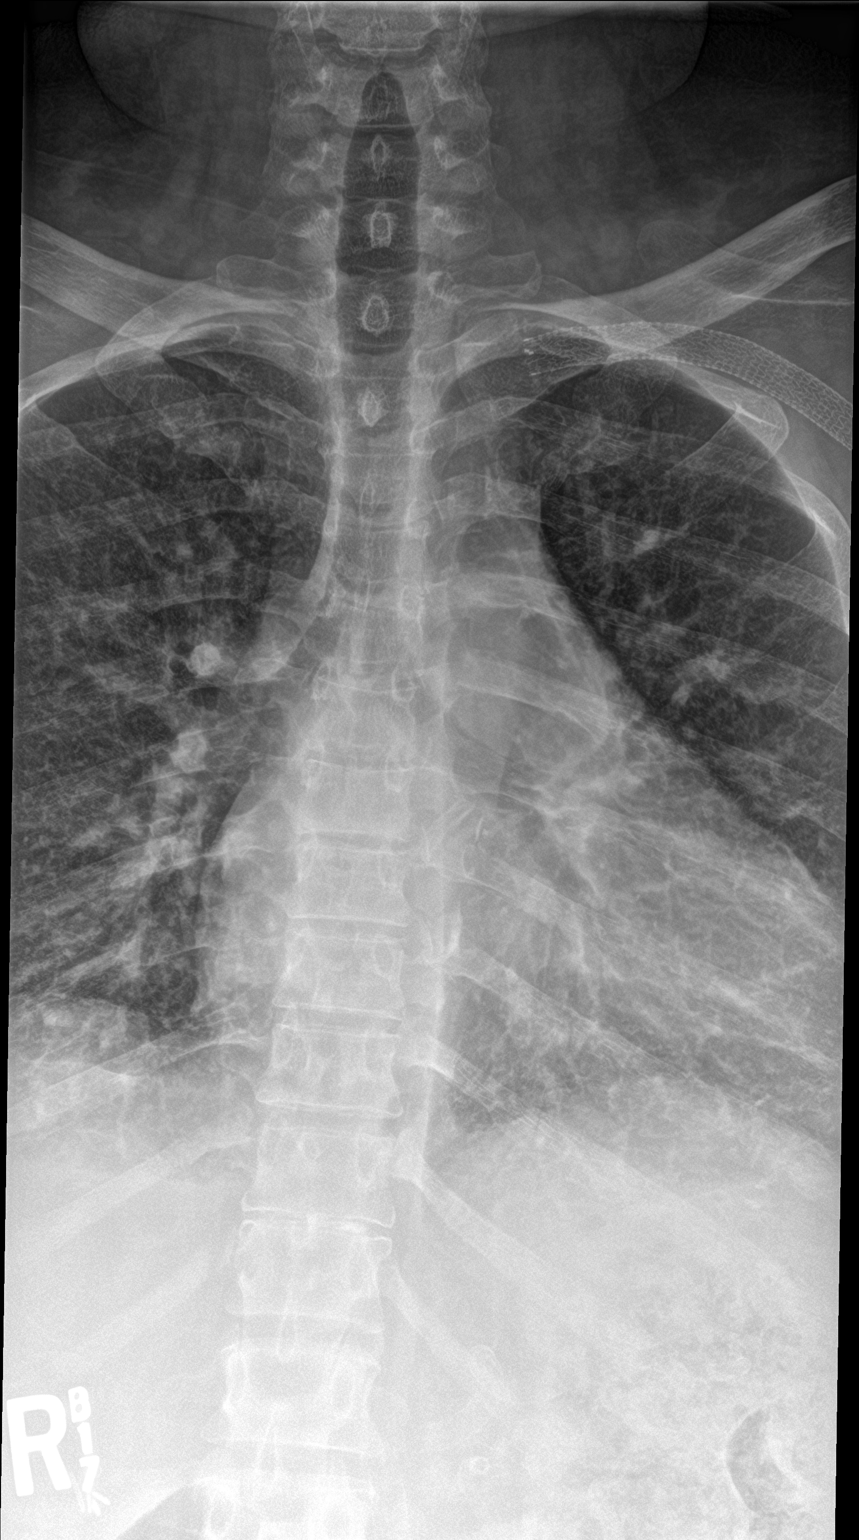

[t-spine swimmers]
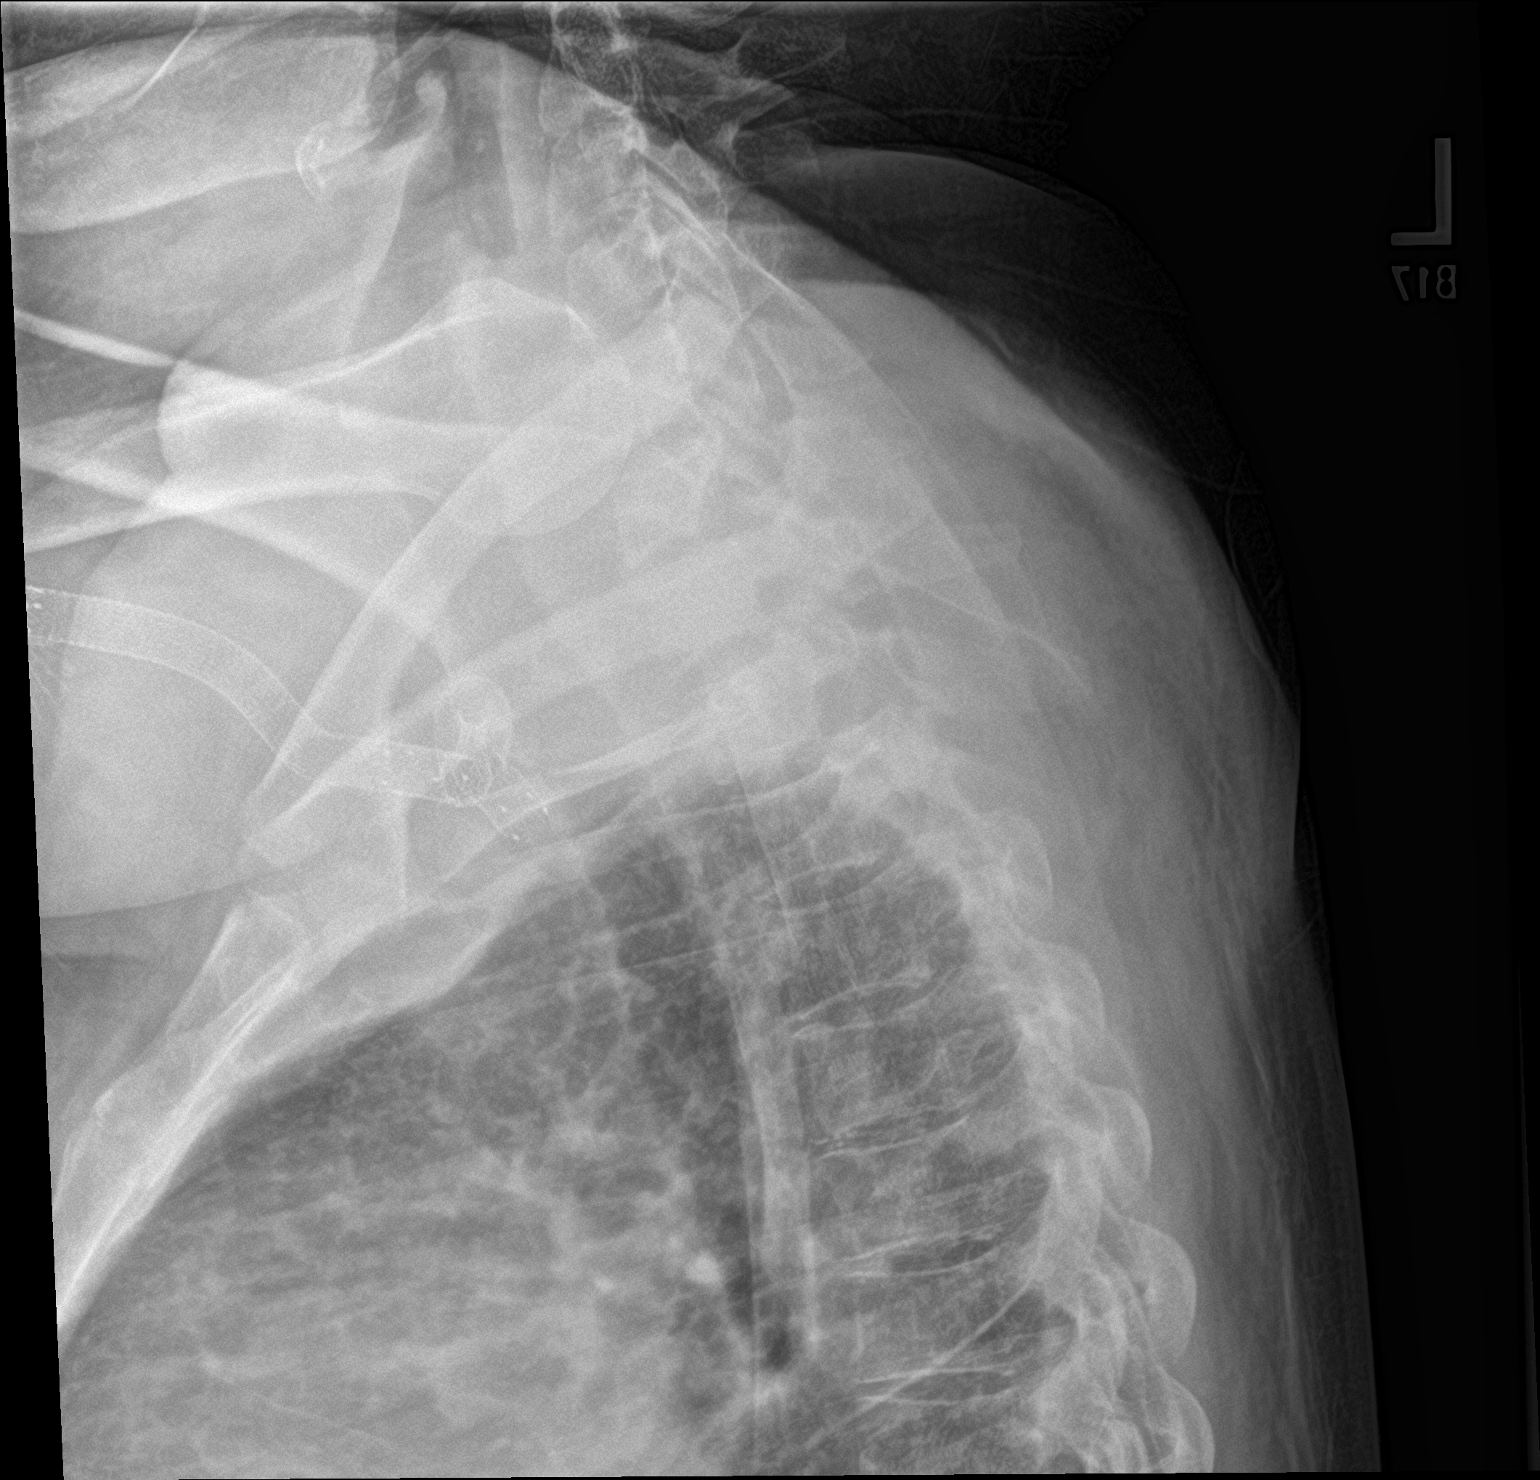

[t-spine lat]
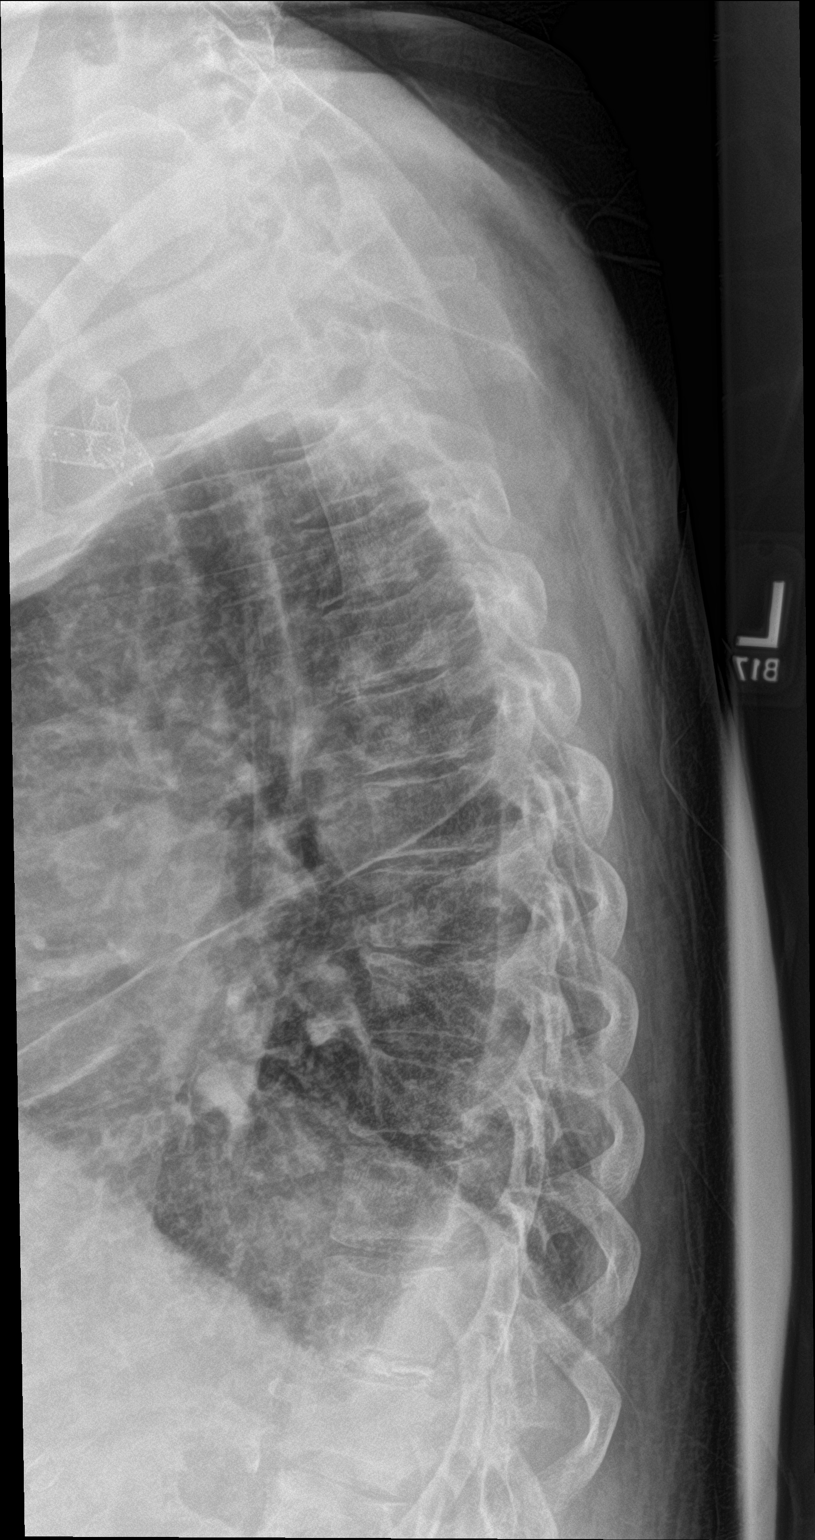

[3 of 3 positions shown; findings below may reference images not displayed]

FINDINGS: Vertebral body alignment and heights are within normal. There is no
acute compression fracture or subluxation. Pedicles are intact.
Remainder of the exam is unchanged.
IMPRESSION: No acute findings.

## 2018-10-23 IMAGING — CR DG LUMBAR SPINE COMPLETE 4+V
5 series · 5 of 5 positions shown · non-contrast
Comparison: KUB 05/05/2018 and CT 03/02/2018

CLINICAL DATA: Fall backwards yesterday landing onto back with low
back pain.

EXAM:
LUMBAR SPINE - COMPLETE 4+ VIEW

[l-spine ap]
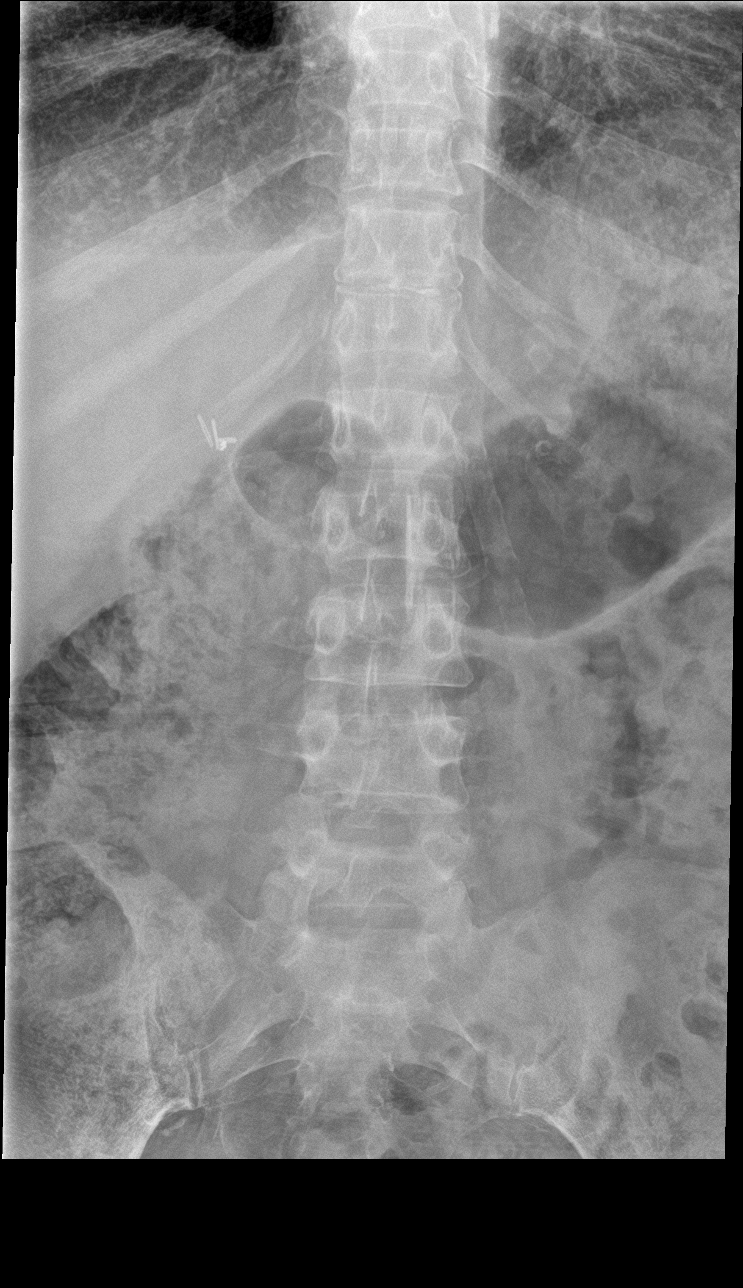

[l-spine obl (1 of 2)]
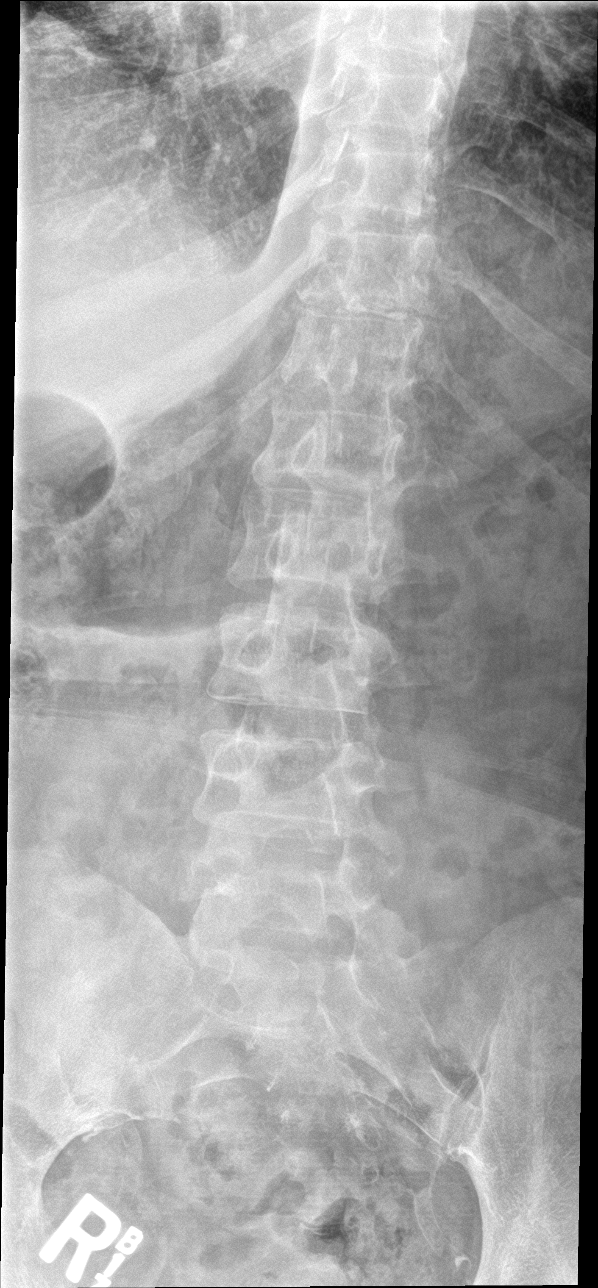

[l-spine obl (2 of 2)]
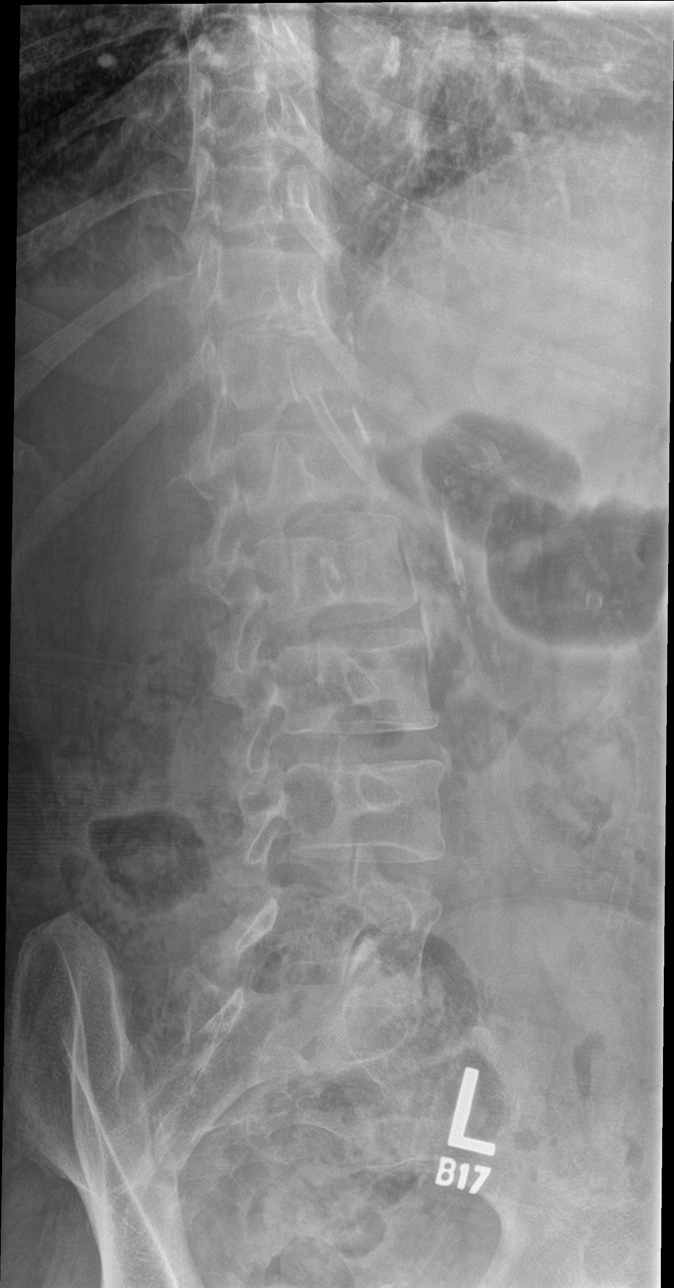

[l-spine lat]
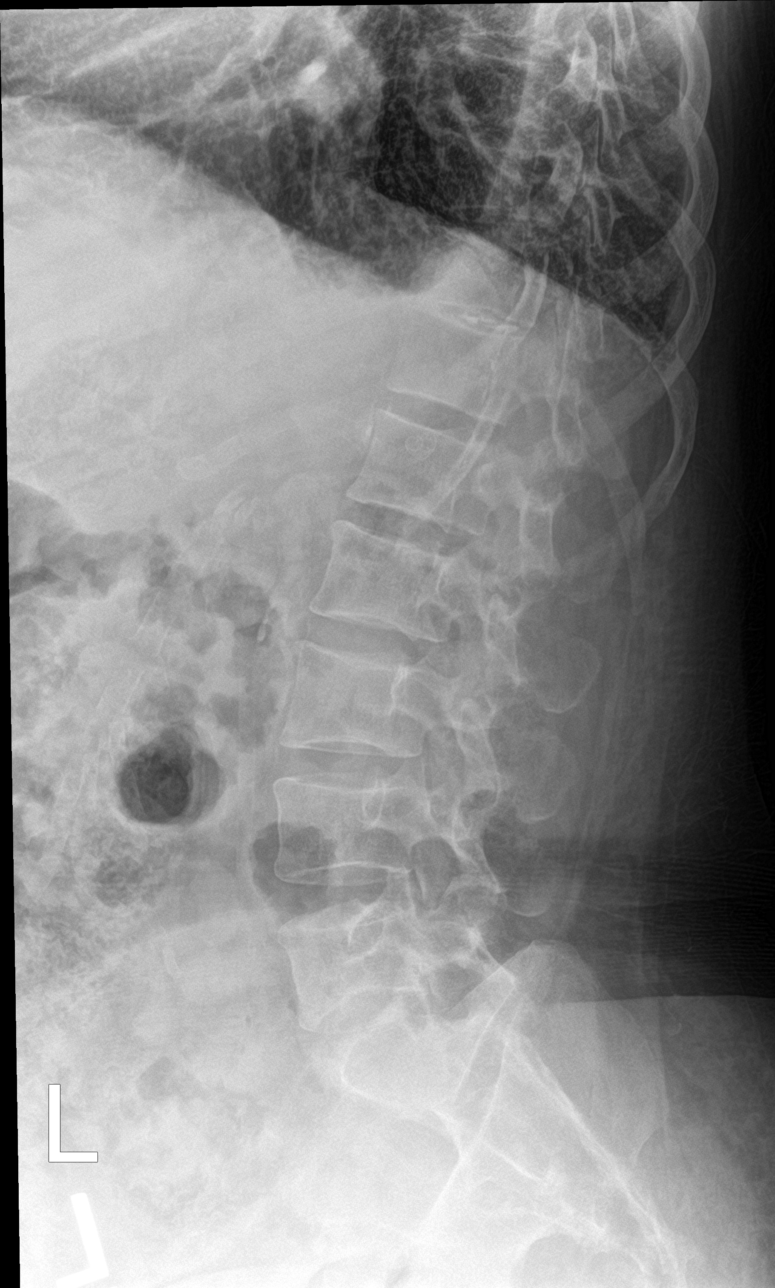

[l-spine spot]
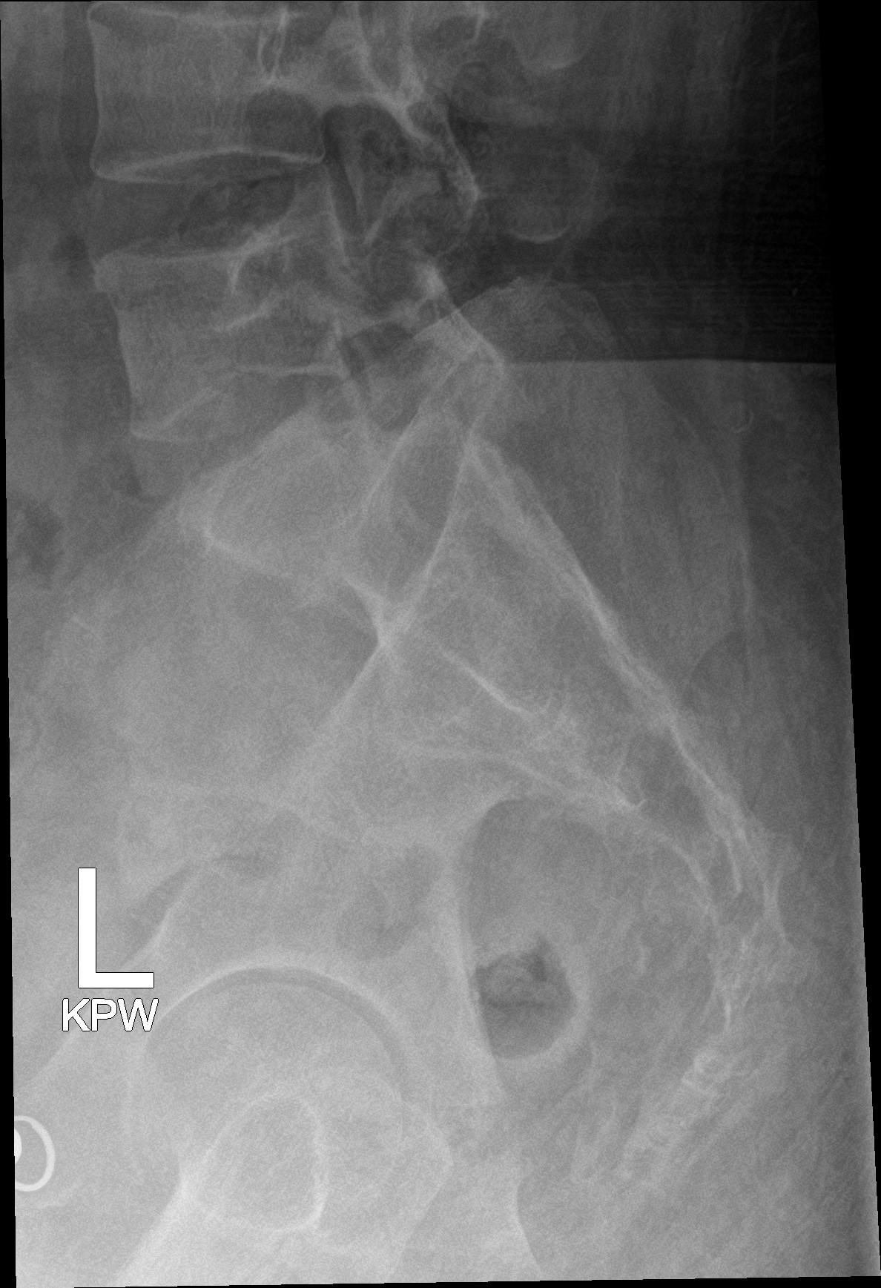

[5 of 5 positions shown; findings below may reference images not displayed]

FINDINGS: Vertebral body alignment, heights and disc space heights are normal.
There is minimal spondylosis present. No evidence of compression
fracture or subluxation. Calcified plaque over the abdominal aorta.
IMPRESSION: No acute findings.

## 2018-10-26 ENCOUNTER — Other Ambulatory Visit: Payer: Self-pay

## 2018-10-26 ENCOUNTER — Ambulatory Visit (INDEPENDENT_AMBULATORY_CARE_PROVIDER_SITE_OTHER): Payer: Medicare Other | Admitting: General Surgery

## 2018-10-26 VITALS — BP 169/69 | HR 83 | Temp 97.5°F | Resp 14 | Ht 65.0 in | Wt 192.8 lb

## 2018-10-26 DIAGNOSIS — R519 Headache, unspecified: Secondary | ICD-10-CM

## 2018-10-26 DIAGNOSIS — R51 Headache: Secondary | ICD-10-CM | POA: Diagnosis not present

## 2018-10-26 NOTE — Progress Notes (Signed)
Patient ID: [name], female   DOB: [birth year], 58 y.o.   MRN: [id number]  Chief Complaint  Patient presents with  . Follow-up    Hospital follow up Right temporal artery/biopsy    HPI [name] is a 58 y.o. female here today to follow up for right temporal artery biopsy done by Dr [name] on 10-11-18.Patient she is feeling well considering and has had a few slight headaches.     HPI  Past Medical History:  Diagnosis Date  . Anemia   . Anginal pain ([place])   . Anxiety   . Arthritis   . Asthma   . Broken wrist   . Bronchitis   . chronic diastolic CHF 7/34/2876  . COPD (chronic obstructive pulmonary disease) ([place])   . Coronary artery disease    a. cath 2013: stenting to RCA (report not available); b. cath 2014: LM nl, pLAD 40%, mLAD nl, ost LCx 40%, mid LCx nl, pRCA 30% @ site of prior stent, mRCA 50%  . Depression   . Diabetes mellitus without complication ([place])   . Diabetic neuropathy ([place])   . dialysis 2006  . Diverticulosis   . Dizziness   . Dyspnea   . Elevated lipids   . Environmental and seasonal allergies   . ESRD (end stage renal disease) on dialysis ([place])    M-W-F  . GERD (gastroesophageal reflux disease)   . Headache   . History of hiatal hernia   . HOH (hard of hearing)   . Hx of pancreatitis 2015  . Hypertension   . Lower extremity edema   . Mitral regurgitation    a. echo 10/2013: EF 62%, noWMA, mildly dilated LA, mild to mod MR/TR, GR1DD  . Myocardial infarction ([place])   . Orthopnea   . Pneumonia   . Renal cancer ([place])   . Renal insufficiency    Pt is on dialysis on M,W + F.  . Wheezing     Past Surgical History:  Procedure Laterality Date  . A/V SHUNTOGRAM Left 01/20/2018   Procedure: A/V SHUNTOGRAM;  Surgeon: Algernon Huxley, MD;  Location: [place] [facility];  Service: Cardiovascular;  Laterality: Left;  . ABDOMINAL HYSTERECTOMY  1992  . AMPUTATION TOE Left 10/02/2017   Procedure: AMPUTATION TOE-LEFT GREAT TOE;  Surgeon: Albertine Patricia, DPM;   Location: [facility];  Service: Podiatry;  Laterality: Left;  . APPENDECTOMY    . ARTERY BIOPSY Right 10/11/2018   Procedure: BIOPSY TEMPORAL ARTERY;  Surgeon: Vickie Epley, MD;  Location: [facility];  Service: General;  Laterality: Right;  . CARDIAC CATHETERIZATION Left 07/26/2015   Procedure: Left Heart Cath and Coronary Angiography;  Surgeon: Dionisio David, MD;  Location: [place] [facility];  Service: Cardiovascular;  Laterality: Left;  . CATARACT EXTRACTION W/ INTRAOCULAR LENS IMPLANT Right   . CATARACT EXTRACTION W/PHACO Left 03/10/2017   Procedure: CATARACT EXTRACTION PHACO AND INTRAOCULAR LENS PLACEMENT (IOC);  Surgeon: Birder Robson, MD;  Location: [facility];  Service: Ophthalmology;  Laterality: Left;  [place] 00:51.9 AP% 14.2 CDE 7.39 fluid pack lot # [id number]  . CHOLECYSTECTOMY    . COLONOSCOPY WITH PROPOFOL N/A 08/12/2016   Procedure: COLONOSCOPY WITH PROPOFOL;  Surgeon: Lollie Sails, MD;  Location: [facility]OSCOPY;  Service: Endoscopy;  Laterality: N/A;  . DIALYSIS FISTULA CREATION Left    upper arm  . dialysis grafts    . ESOPHAGOGASTRODUODENOSCOPY N/A 03/08/2015   Procedure: ESOPHAGOGASTRODUODENOSCOPY (EGD);  Surgeon: Manya Silvas, MD;  Location: [facility]  ENDOSCOPY;  Service: Endoscopy;  Laterality: N/A;  . ESOPHAGOGASTRODUODENOSCOPY (EGD) WITH PROPOFOL N/A 03/18/2016   Procedure: ESOPHAGOGASTRODUODENOSCOPY (EGD) WITH PROPOFOL;  Surgeon: [name], MD;  Location: [facility] ENDOSCOPY;  Service: Endoscopy;  Laterality: N/A;  . EYE SURGERY Right 2018  . FECAL TRANSPLANT N/A 08/23/2015   Procedure: FECAL TRANSPLANT;  Surgeon: Manya Silvas, MD;  Location: [facility] ENDOSCOPY;  Service: Endoscopy;  Laterality: N/A;  . HAND SURGERY Bilateral   . PERIPHERAL VASCULAR CATHETERIZATION N/A 12/20/2015   Procedure: Thrombectomy of dialysis access versus permcath placement;  Surgeon: Algernon Huxley, MD;  Location: [place] [facility];  Service: Cardiovascular;  Laterality: N/A;  . PERIPHERAL  VASCULAR CATHETERIZATION N/A 12/20/2015   Procedure: A/V Shunt Intervention;  Surgeon: Algernon Huxley, MD;  Location: [place] [facility];  Service: Cardiovascular;  Laterality: N/A;  . PERIPHERAL VASCULAR CATHETERIZATION N/A 12/20/2015   Procedure: A/V Shuntogram/Fistulagram;  Surgeon: Algernon Huxley, MD;  Location: [place] [facility];  Service: Cardiovascular;  Laterality: N/A;  . PERIPHERAL VASCULAR CATHETERIZATION N/A 01/02/2016   Procedure: A/V Shuntogram/Fistulagram;  Surgeon: Algernon Huxley, MD;  Location: [place] [facility];  Service: Cardiovascular;  Laterality: N/A;  . PERIPHERAL VASCULAR CATHETERIZATION N/A 01/02/2016   Procedure: A/V Shunt Intervention;  Surgeon: Algernon Huxley, MD;  Location: [place] [facility];  Service: Cardiovascular;  Laterality: N/A;    Family History  Problem Relation Age of Onset  . Kidney disease Mother   . Diabetes Mother   . Cancer Father   . Kidney disease Sister     Social History Social History   Tobacco Use  . Smoking status: Former Smoker    Packs/day: 0.50    Years: 40.00    Pack years: 20.00    Types: Cigarettes    Last attempt to quit: 02/13/2015    Years since quitting: 3.7  . Smokeless tobacco: Never Used  Substance Use Topics  . Alcohol use: Not Currently    Comment: glass wine week per pt  . Drug use: Yes    Types: Marijuana    Comment: once a day    Allergies  Allergen Reactions  . Ace Inhibitors Swelling and Anaphylaxis  . Ativan [Lorazepam] Other (See Comments)    Reaction:Hallucinations and headaches  . Compazine [Prochlorperazine Edisylate] Anaphylaxis, Nausea And Vomiting and Other (See Comments)    Other reaction(s): dystonia from this vs. Reglan, 23 Jul - patient relates that she takes promethazine frequently with no problems  . Sumatriptan Succinate Other (See Comments)    Other reaction(s): delirium and hallucinations per [facility] records  . Dilaudid [Hydromorphone Hcl] Other (See Comments)    Delirium   . Ondansetron  Other (See Comments)    hallucinations    . Zofran [Ondansetron Hcl] Other (See Comments)    Reaction:  hallucinations   . Codeine Nausea And Vomiting  . Gabapentin Other (See Comments)    Reaction:  Unknown    . [place] Nausea And Vomiting  . Losartan Nausea Only  . Oxycodone Anxiety  . Prochlorperazine Other (See Comments)    Reaction:  Unknown . Patient does not remember reaction but she does have vertigo and anxiety along with n and v at times. Could be used to treat any of these   . Reglan [Metoclopramide] Other (See Comments)    Per patient her Dr. Evelina Bucy her off it   . Scopolamine Other (See Comments)    Dizziness, also has vertigo already  . Tape Rash  .  Tapentadol Rash    Current Outpatient Medications  Medication Sig Dispense Refill  . albuterol (PROVENTIL HFA;VENTOLIN HFA) 108 (90 Base) MCG/ACT inhaler Inhale 2 puffs into the lungs every 6 (six) hours as needed for wheezing or shortness of breath. 1 Inhaler 2  . albuterol (PROVENTIL) (2.5 MG/3ML) 0.083% nebulizer solution Take 2.5 mg by nebulization every 4 (four) hours as needed for wheezing or shortness of breath.    Marland Kitchen amLODipine (NORVASC) 10 MG tablet Take 10 mg by mouth daily.    Marland Kitchen aspirin EC 81 MG tablet Take 81 mg by mouth daily.    Marland Kitchen atorvastatin (LIPITOR) 20 MG tablet Take 20 mg by mouth every evening.     Marland Kitchen azithromycin (ZITHROMAX) 250 MG tablet For 4 days 4 each 0  . Biotin 1 MG CAPS Take 1 mg by mouth daily.    . carvedilol (COREG) 3.125 MG tablet Take 1 tablet (3.125 mg total) by mouth 2 (two) times daily with a meal. 30 tablet 0  . cetirizine (ZYRTEC) 10 MG tablet Take 10 mg by mouth daily.     . chlorpheniramine-HYDROcodone (TUSSIONEX) 10-8 MG/5ML SUER Take 5 mLs by mouth every 12 (twelve) hours. 70 mL 0  . cholecalciferol (VITAMIN D) 400 units TABS tablet Take 400 Units by mouth daily.    . cyanocobalamin 1000 MCG tablet Take 1,000 mcg by mouth daily.     . diclofenac sodium (VOLTAREN) 1 % GEL Apply 1  application topically daily as needed (for rash).    . ferrous sulfate ([employer] IRON) 325 (65 FE) MG tablet Take 325 mg by mouth daily with breakfast.    . FLUoxetine (PROZAC) 20 MG capsule Take 1 capsule (20 mg total) by mouth daily. (Patient taking differently: Take 20 mg by mouth at bedtime. ) 30 capsule 0  . hydrocortisone cream 0.5 % Apply topically 3 (three) times daily. 30 g 0  . insulin aspart (NOVOLOG FLEXPEN) 100 UNIT/ML FlexPen Inject 4-15 Units into the skin 4 (four) times daily as needed for high blood sugar (> 100, per sliding scale).    Marland Kitchen lidocaine (LIDODERM) 5 % Place 1 patch onto the skin every 12 (twelve) hours. Remove & Discard patch within 12 hours or as directed by MD 10 patch 0  . lipase/protease/amylase (CREON) 12000 UNITS CPEP capsule Take 36000 mg in the morning, 24000 midday & 36000 in the evening    . LORazepam (ATIVAN PO) Take by mouth.    . multivitamin (RENA-VIT) TABS tablet Take 1 tablet by mouth daily.     . nitroGLYCERIN (NITROSTAT) 0.4 MG SL tablet Place 0.4 mg under the tongue every 5 (five) minutes as needed for chest pain.     . predniSONE (DELTASONE) 10 MG tablet Take 6 tablets (60 mg total) by mouth daily with breakfast. Need to follow up with pcp in 3-5 days regarding biopsy results from temporal artery and decide wether to continue high dose prednsione or not 10 tablet 0  . ranitidine (ZANTAC) 300 MG capsule Take 1 capsule by mouth every evening.  1  . sevelamer carbonate (RENVELA) 800 MG tablet Take 800-2,400 mg by mouth See admin instructions. Take 2400 mg by mouth 3 times daily with meals and take 800 mg by mouth with snacks     No current facility-administered medications for this visit.     Review of Systems Review of Systems  Constitutional: Negative.   Respiratory: Negative.   Cardiovascular: Negative.   Neurological: Positive for headaches.    Blood  pressure (!) 169/69, pulse 83, temperature (!) 97.5 F (36.4 C), temperature source Temporal,  resp. rate 14, height 5\' 5"  (1.651 m), weight 192 lb 12.8 oz (87.5 kg), SpO2 97 %.  Physical Exam Physical Exam Constitutional:      Appearance: She is well-developed.  HENT:     Head:      Comments: dermabond intact Eyes:     General: No scleral icterus.    Conjunctiva/sclera: Conjunctivae normal.  Neck:     Musculoskeletal: Normal range of motion.  Cardiovascular:     Rate and Rhythm: Normal rate and regular rhythm.     Heart sounds: Normal heart sounds.  Pulmonary:     Effort: Pulmonary effort is normal.     Breath sounds: Normal breath sounds.  Skin:    General: Skin is warm and dry.  Neurological:     Mental Status: She is alert and oriented to person, place, and time.     Data Reviewed DIAGNOSIS:  A. TEMPORAL ARTERY, RIGHT; BIOPSY:  - NEGATIVE FOR ARTERITIS.  - EARLY MEDIAL CALCIFICATION.   Assessment    Doing well post temporal artery biopsy.    Plan  Follow-up will be on an as-needed basis.  The patient was advised that the Dermabond placed the time of surgery will loosen over the coming days to week and come off without difficulty.   HPI, Physical Exam, Assessment and Plan have been scribed under the direction and in the presence of Hervey Ard, Md.  Eudelia Bunch R. Bobette Mo, CMA  HPI and physical exam has been scribed under the direction and in the presence of Robert Bellow, MD. Karie Fetch, RN  I have completed the exam and reviewed the above documentation for accuracy and completeness.  I agree with the above.  Haematologist has been used and any errors in dictation or transcription are unintentional.  Hervey Ard, M.D., F.A.C.S.  Forest Gleason Mikolaj Woolstenhulme 10/27/2018, 3:33 PM

## 2018-10-26 NOTE — Patient Instructions (Signed)
The patient is aware to call back for any questions or new concerns.  

## 2018-11-05 ENCOUNTER — Other Ambulatory Visit: Payer: Self-pay | Admitting: Primary Care

## 2018-11-05 DIAGNOSIS — Z1231 Encounter for screening mammogram for malignant neoplasm of breast: Secondary | ICD-10-CM

## 2018-11-09 ENCOUNTER — Telehealth (INDEPENDENT_AMBULATORY_CARE_PROVIDER_SITE_OTHER): Payer: Self-pay | Admitting: Vascular Surgery

## 2018-11-09 NOTE — Telephone Encounter (Signed)
Patient should see her PCP or dermatologist to assess these spots on her legs.

## 2018-11-09 NOTE — Telephone Encounter (Signed)
Patient was last seen on 09/30/2018.

## 2018-11-18 ENCOUNTER — Ambulatory Visit (INDEPENDENT_AMBULATORY_CARE_PROVIDER_SITE_OTHER): Payer: Medicare Other | Admitting: Vascular Surgery

## 2018-11-22 ENCOUNTER — Emergency Department
Admission: EM | Admit: 2018-11-22 | Discharge: 2018-11-22 | Disposition: A | Discharge status: Home / Self Care | Payer: Medicare Other | Attending: Emergency Medicine | Admitting: Emergency Medicine

## 2018-11-22 ENCOUNTER — Emergency Department: Payer: Medicare Other

## 2018-11-22 ENCOUNTER — Encounter: Payer: Self-pay | Admitting: Intensive Care

## 2018-11-22 ENCOUNTER — Other Ambulatory Visit: Payer: Self-pay

## 2018-11-22 DIAGNOSIS — M79604 Pain in right leg: Secondary | ICD-10-CM | POA: Insufficient documentation

## 2018-11-22 DIAGNOSIS — W19XXXA Unspecified fall, initial encounter: Secondary | ICD-10-CM

## 2018-11-22 DIAGNOSIS — Y999 Unspecified external cause status: Secondary | ICD-10-CM | POA: Diagnosis not present

## 2018-11-22 DIAGNOSIS — J449 Chronic obstructive pulmonary disease, unspecified: Secondary | ICD-10-CM | POA: Insufficient documentation

## 2018-11-22 DIAGNOSIS — I2510 Atherosclerotic heart disease of native coronary artery without angina pectoris: Secondary | ICD-10-CM | POA: Diagnosis not present

## 2018-11-22 DIAGNOSIS — E1122 Type 2 diabetes mellitus with diabetic chronic kidney disease: Secondary | ICD-10-CM | POA: Diagnosis not present

## 2018-11-22 DIAGNOSIS — W0110XA Fall on same level from slipping, tripping and stumbling with subsequent striking against unspecified object, initial encounter: Secondary | ICD-10-CM | POA: Insufficient documentation

## 2018-11-22 DIAGNOSIS — Y9301 Activity, walking, marching and hiking: Secondary | ICD-10-CM | POA: Insufficient documentation

## 2018-11-22 DIAGNOSIS — Z7982 Long term (current) use of aspirin: Secondary | ICD-10-CM | POA: Insufficient documentation

## 2018-11-22 DIAGNOSIS — I5031 Acute diastolic (congestive) heart failure: Secondary | ICD-10-CM | POA: Diagnosis not present

## 2018-11-22 DIAGNOSIS — Z992 Dependence on renal dialysis: Secondary | ICD-10-CM | POA: Diagnosis not present

## 2018-11-22 DIAGNOSIS — Y929 Unspecified place or not applicable: Secondary | ICD-10-CM | POA: Diagnosis not present

## 2018-11-22 DIAGNOSIS — I132 Hypertensive heart and chronic kidney disease with heart failure and with stage 5 chronic kidney disease, or end stage renal disease: Secondary | ICD-10-CM | POA: Diagnosis not present

## 2018-11-22 DIAGNOSIS — Z794 Long term (current) use of insulin: Secondary | ICD-10-CM | POA: Insufficient documentation

## 2018-11-22 DIAGNOSIS — N186 End stage renal disease: Secondary | ICD-10-CM | POA: Insufficient documentation

## 2018-11-22 MED ORDER — MORPHINE SULFATE (PF) 4 MG/ML IV SOLN
4.0000 mg | Freq: Once | INTRAVENOUS | Status: AC
  Administered 2018-11-22: 4 mg via INTRAVENOUS
  Filled 2018-11-22: qty 1

## 2018-11-22 MED ORDER — CYCLOBENZAPRINE HCL 10 MG PO TABS: 10.0000 mg | ORAL_TABLET | Freq: Three times a day (TID) | ORAL | 0 refills | Status: DC | PRN

## 2018-11-22 NOTE — Discharge Instructions (Addendum)
Continue previous narcotic pain medication and take Flexeril to help with the muscle ache.

## 2018-11-22 NOTE — ED Provider Notes (Signed)
[facility] Emergency Department Provider Note   ____________________________________________   First MD Initiated Contact with Patient 11/22/18 1058     (approximate)  I have reviewed the triage vital signs and the nursing notes.   HISTORY  Chief Complaint Knee Pain (right) and Fall    HPI [name] is a 58 y.o. female patient complain of posterior neck pain, right hip pain, and right knee pain secondary to fall.  Patient states she was boarding a transport bus and tripped over rib causing her to fall landed on her right knee.  Initially patient did not complain of neck pain but states she is having neck pain at this time.  Patient also complained of pain to the right hip.  Patient denies LOC or head injury.  Patient rates the pain as a 10/10.          Past Medical History:  Diagnosis Date  . Anemia   . Anginal pain ([place])   . Anxiety   . Arthritis   . Asthma   . Broken wrist   . Bronchitis   . chronic diastolic CHF 4/48/1856  . COPD (chronic obstructive pulmonary disease) ([place])   . Coronary artery disease    a. cath 2013: stenting to RCA (report not available); b. cath 2014: LM nl, pLAD 40%, mLAD nl, ost LCx 40%, mid LCx nl, pRCA 30% @ site of prior stent, mRCA 50%  . Depression   . Diabetes mellitus without complication ([place])   . Diabetic neuropathy ([place])   . dialysis 2006  . Diverticulosis   . Dizziness   . Dyspnea   . Elevated lipids   . Environmental and seasonal allergies   . ESRD (end stage renal disease) on dialysis ([place])    M-W-F  . GERD (gastroesophageal reflux disease)   . Headache   . History of hiatal hernia   . HOH (hard of hearing)   . Hx of pancreatitis 2015  . Hypertension   . Lower extremity edema   . Mitral regurgitation    a. echo 10/2013: EF 62%, noWMA, mildly dilated LA, mild to mod MR/TR, GR1DD  . Myocardial infarction ([place])   . Orthopnea   . Pneumonia   . Renal cancer ([place])   . Renal insufficiency    Pt  is on dialysis on M,W + F.  . Wheezing     Patient Active Problem List   Diagnosis Date Noted  . Right-sided headache   . COPD (chronic obstructive pulmonary disease) ([place]) 10/06/2018  . Syncope 10/04/2018  . Symptomatic anemia 06/18/2018  . Gastroparesis due to DM ([place]) 01/18/2018  . Complication of vascular access for dialysis 12/04/2017  . Osteomyelitis ([place]) 09/30/2017  . Carotid stenosis 06/18/2017  . Shortness of breath 05/04/2017  . Cellulitis of lower extremity 07/29/2016  . Chronic venous insufficiency 07/29/2016  . Lymphedema 07/29/2016  . TIA (transient ischemic attack) 04/21/2016  . Altered mental status 04/08/2016  . Hyperammonemia ([place]) 04/08/2016  . Elevated troponin 04/08/2016  . Depression 04/08/2016  . Depression, major, recurrent, severe with psychosis ([place]) 04/08/2016  . Blood in stool   . Intractable cyclical vomiting with nausea   . Reflux esophagitis   . Gastritis   . Generalized abdominal pain   . Uncontrollable vomiting   . Major depressive disorder, recurrent episode, moderate ([place]) 03/15/2016  . Adjustment disorder with mixed anxiety and depressed mood 03/15/2016  . Malnutrition of moderate degree 12/01/2015  . Renal mass   . Dyspnea   .  Acute renal failure ([place])   . Respiratory failure ([place])   . High temperature 11/14/2015  . Pulmonary edema   . Encounter for central line placement   . Encounter for orogastric (OG) tube placement   . Nausea 11/12/2015  . Hyperkalemia 10/03/2015  . Diarrhea, unspecified 07/22/2015  . Pneumonia 05/21/2015  . Hypoglycemia 04/24/2015  . Unresponsiveness 04/24/2015  . Bradycardia 04/24/2015  . Hypothermia 04/24/2015  . Acute respiratory failure ([place]) 04/24/2015  . Acute diastolic CHF (congestive heart failure) ([place]) 04/05/2015  . Diabetic gastroparesis ([place]) 04/05/2015  . Hypokalemia 04/05/2015  . Generalized weakness 04/05/2015  . Acute pulmonary edema ([place]) 04/03/2015  . Nausea and vomiting 04/03/2015    . Hypoglycemia associated with diabetes ([place]) 04/03/2015  . Anemia of chronic disease 04/03/2015  . Secondary hyperparathyroidism ([place]) 04/03/2015  . Pressure ulcer 04/02/2015  . Acute respiratory failure with hypoxia ([place]) 04/01/2015  . Adjustment disorder with anxiety 03/14/2015  . Somatic symptom disorder, mild 03/08/2015  . Coronary artery disease involving native coronary artery of native heart without angina pectoris   . Nausea & vomiting 03/06/2015  . Abdominal pain 03/06/2015  . DM (diabetes mellitus) ([place]) 03/06/2015  . HTN (hypertension) 03/06/2015  . Gastroparesis 02/24/2015  . Pleural effusion 02/19/2015  . HCAP (healthcare-associated pneumonia) 02/19/2015  . End-stage renal disease on hemodialysis ([place]) 02/19/2015    Past Surgical History:  Procedure Laterality Date  . A/V SHUNTOGRAM Left 01/20/2018   Procedure: A/V SHUNTOGRAM;  Surgeon: Algernon Huxley, MD;  Location: [facility];  Service: Cardiovascular;  Laterality: Left;  . ABDOMINAL HYSTERECTOMY  1992  . AMPUTATION TOE Left 10/02/2017   Procedure: AMPUTATION TOE-LEFT GREAT TOE;  Surgeon: Albertine Patricia, DPM;  Location: [facility] OR[facility];  Service: Podiatry;  Laterality: Left;  . APPENDECTOMY    . ARTERY BIOPSY Right 10/11/2018   Procedure: BIOPSY TEMPORAL ARTERY;  Surgeon: Vickie Epley, MD;  Location: [facility];  Service: General;  Laterality: Right;  . CARDIAC CATHETERIZATION Left 07/26/2015   Procedure: Left Heart Cath and Coronary Angiography;  Surgeon: Dionisio David, MD;  Location: [facility];  Service: Cardiovascular;  Laterality: Left;  . CATARACT EXTRACTION W/ INTRAOCULAR LENS IMPLANT Right   . CATARACT EXTRACTION W/PHACO Left 03/10/2017   Procedure: CATARACT EXTRACTION PHACO AND INTRAOCULAR LENS PLACEMENT (IOC);  Surgeon: Birder Robson, MD;  Location: [facility];  Service: Ophthalmology;  Laterality: Left;  [place] 00:51.9 AP% 14.2 CDE 7.39 fluid pack lot # [id number]  . CHOLECYSTECTOMY    .  COLONOSCOPY WITH PROPOFOL N/A 08/12/2016   Procedure: COLONOSCOPY WITH PROPOFOL;  Surgeon: Lollie Sails, MD;  Location: [facility]OSCOPY;  Service: Endoscopy;  Laterality: N/A;  . DIALYSIS FISTULA CREATION Left    upper arm  . dialysis grafts    . ESOPHAGOGASTRODUODENOSCOPY N/A 03/08/2015   Procedure: ESOPHAGOGASTRODUODENOSCOPY (EGD);  Surgeon: Manya Silvas, MD;  Location[facility];  Service: Endoscopy;  Laterality: N/A;  . ESOPHAGOGASTRODUODENOSCOPY (EGD) WITH PROPOFOL N/A 03/18/2016   Procedure: ESOPHAGOGASTRODUODENOSCOPY (EGD) WITH PROPOFOL;  Surgeon: [name], MD;  Location: [facility] ENDOSCOPY;  Service: Endoscopy;  Laterality: N/A;  . EYE SURGERY Right 2018  . FECAL TRANSPLANT N/A 08/23/2015   Procedure: FECAL TRANSPLANT;  Surgeon: Manya Silvas, MD;  Location: [facility] ENDOSCOPY;  Service: Endoscopy;  Laterality: N/A;  . HAND SURGERY Bilateral   . PERIPHERAL VASCULAR CATHETERIZATION N/A 12/20/2015   Procedure: Thrombectomy of dialysis access versus permcath placement;  Surgeon: Algernon Huxley, MD;  Location: [place] [facility];  Service: Cardiovascular;  Laterality: N/A;  . PERIPHERAL VASCULAR CATHETERIZATION N/A 12/20/2015   Procedure: A/V Shunt Intervention;  Surgeon: Algernon Huxley, MD;  Location: [place];  Service: Cardiovascular;  Laterality: N/A;  . PERIPHERAL VASCULAR CATHETERIZATION N/A 12/20/2015   Procedure: A/V Shuntogram/Fistulagram;  Surgeon: Algernon Huxley, MD;  Location: [place] [facility];  Service: Cardiovascular;  Laterality: N/A;  . PERIPHERAL VASCULAR CATHETERIZATION N/A 01/02/2016   Procedure: A/V Shuntogram/Fistulagram;  Surgeon: Algernon Huxley, MD;  Location: [place] [facility];  Service: Cardiovascular;  Laterality: N/A;  . PERIPHERAL VASCULAR CATHETERIZATION N/A 01/02/2016   Procedure: A/V Shunt Intervention;  Surgeon: Algernon Huxley, MD;  Location: [place] [facility];  Service: Cardiovascular;  Laterality: N/A;    Prior to Admission medications     Medication Sig Start Date End Date Taking? Authorizing Provider  albuterol (PROVENTIL HFA;VENTOLIN HFA) 108 (90 Base) MCG/ACT inhaler Inhale 2 puffs into the lungs every 6 (six) hours as needed for wheezing or shortness of breath. 02/17/18   Merlyn Lot, MD  albuterol (PROVENTIL) (2.5 MG/3ML) 0.083% nebulizer solution Take 2.5 mg by nebulization every 4 (four) hours as needed for wheezing or shortness of breath.    [provider]  amLODipine (NORVASC) 10 MG tablet Take 10 mg by mouth daily.    [provider]  aspirin EC 81 MG tablet Take 81 mg by mouth daily. 09/08/16   [provider]  atorvastatin (LIPITOR) 20 MG tablet Take 20 mg by mouth every evening.  12/02/16   [provider]  azithromycin (ZITHROMAX) 250 MG tablet For 4 days 10/11/18   Epifanio Lesches, MD  Biotin 1 MG CAPS Take 1 mg by mouth daily.    [provider]  carvedilol (COREG) 3.125 MG tablet Take 1 tablet (3.125 mg total) by mouth 2 (two) times daily with a meal. 10/11/18   Epifanio Lesches, MD  cetirizine (ZYRTEC) 10 MG tablet Take 10 mg by mouth daily.     [provider]  chlorpheniramine-HYDROcodone (TUSSIONEX) 10-8 MG/5ML SUER Take 5 mLs by mouth every 12 (twelve) hours. 10/11/18   Epifanio Lesches, MD  cholecalciferol (VITAMIN D) 400 units TABS tablet Take 400 Units by mouth daily.    [provider]  cyanocobalamin 1000 MCG tablet Take 1,000 mcg by mouth daily.     [provider]  cyclobenzaprine (FLEXERIL) 10 MG tablet Take 1 tablet (10 mg total) by mouth 3 (three) times daily as needed. 11/22/18   Sable Feil, PA-C  diclofenac sodium (VOLTAREN) 1 % GEL Apply 1 application topically daily as needed (for rash).    [provider]  ferrous sulfate ([employer] IRON) 325 (65 FE) MG tablet Take 325 mg by mouth daily with breakfast.    [provider]  FLUoxetine (PROZAC) 20 MG capsule Take 1 capsule (20 mg total) by mouth  daily. Patient taking differently: Take 20 mg by mouth at bedtime.  04/10/16   Hower, Aaron Mose, MD  hydrocortisone cream 0.5 % Apply topically 3 (three) times daily. 01/21/18   Wieting, Richard, MD  insulin aspart (NOVOLOG FLEXPEN) 100 UNIT/ML FlexPen Inject 4-15 Units into the skin 4 (four) times daily as needed for high blood sugar (> 100, per sliding scale).    [provider]  lidocaine (LIDODERM) 5 % Place 1 patch onto the skin every 12 (twelve) hours. Remove & Discard patch within 12 hours or as directed by MD 02/20/18 02/20/19  Nance Pear, MD  lipase/protease/amylase (CREON)  12000 UNITS CPEP capsule Take 36000 mg in the morning, 24000 midday & 36000 in the evening    [provider]  LORazepam (ATIVAN PO) Take by mouth.    [provider]  multivitamin (RENA-VIT) TABS tablet Take 1 tablet by mouth daily.     [provider]  nitroGLYCERIN (NITROSTAT) 0.4 MG SL tablet Place 0.4 mg under the tongue every 5 (five) minutes as needed for chest pain.     [provider]  predniSONE (DELTASONE) 10 MG tablet Take 6 tablets (60 mg total) by mouth daily with breakfast. Need to follow up with pcp in 3-5 days regarding biopsy results from temporal artery and decide wether to continue high dose prednsione or not 10/11/18   Epifanio Lesches, MD  ranitidine (ZANTAC) 300 MG capsule Take 1 capsule by mouth every evening. 01/19/18   [provider]  sevelamer carbonate (RENVELA) 800 MG tablet Take 800-2,400 mg by mouth See admin instructions. Take 2400 mg by mouth 3 times daily with meals and take 800 mg by mouth with snacks    [provider]    Allergies Ace inhibitors; Ativan [lorazepam]; Compazine [prochlorperazine edisylate]; Sumatriptan succinate; Dilaudid [hydromorphone hcl]; Ondansetron; Zofran [ondansetron hcl]; Codeine; Gabapentin; [place]; Losartan; Oxycodone; Prochlorperazine; Reglan [metoclopramide]; Scopolamine; Tape; and  Tapentadol  Family History  Problem Relation Age of Onset  . Kidney disease Mother   . Diabetes Mother   . Cancer Father   . Kidney disease Sister     Social History Social History   Tobacco Use  . Smoking status: Former Smoker    Packs/day: 0.50    Years: 40.00    Pack years: 20.00    Types: Cigarettes    Last attempt to quit: 02/13/2015    Years since quitting: 3.7  . Smokeless tobacco: Never Used  Substance Use Topics  . Alcohol use: Not Currently    Comment: glass wine week per pt  . Drug use: Yes    Types: Marijuana    Comment: once a day    Review of Systems Constitutional: No fever/chills Eyes: No visual changes. ENT: No sore throat. Cardiovascular: Denies chest pain. Respiratory: Denies shortness of breath. Gastrointestinal: No abdominal pain.  No nausea, no vomiting.  No diarrhea.  No constipation. Genitourinary: Negative for dysuria. Musculoskeletal: Neck, right hip, right knee pain. Skin: Negative for rash. Neurological: Negative for headaches, focal weakness or numbness. Psychiatric:  Diabetes and hypertension. Endocrine: Anxiety  and depression  ____________________________________________   PHYSICAL EXAM:  VITAL SIGNS: ED Triage Vitals  Enc Vitals Group     BP --      Pulse --      Resp --      Temp 11/22/18 1059 97.6 F (36.4 C)     Temp Source 11/22/18 1059 Oral     SpO2 --      Weight 11/22/18 1101 182 lb (82.6 kg)     Height 11/22/18 1101 5' 5.5" (1.664 m)     Head Circumference --      Peak Flow --      Pain Score 11/22/18 1101 10     Pain Loc --      Pain Edu? --      Excl. in [place]? --     Constitutional: Alert and oriented. Well appearing and in no acute distress. Neck: No stridor.  No cervical spine tenderness to palpation. Hematological/Lymphatic/Immunilogical: No cervical lymphadenopathy. Cardiovascular: Normal rate, regular rhythm. Grossly normal heart sounds.  Good peripheral circulation.  Respiratory: Normal respiratory  effort.  No retractions. Lungs CTAB. Gastrointestinal: Soft and nontender. No distention. No abdominal bruits. No CVA tenderness. Musculoskeletal: No obvious deformity of the cervical spine, noted leg length discrepancy, and no deformity to the right knee.  Patient has moderate guarding palpation of the right hip and right knee.  Patient refused range of motion of the hip or the knee.  Neurologic:  Normal speech and language. No gross focal neurologic deficits are appreciated. No gait instability. Skin:  Skin is warm, dry and intact. No rash noted.  Abrasion anterior right knee Psychiatric: Mood and affect are normal. Speech and behavior are normal.  ____________________________________________   LABS (all labs ordered are listed, but only abnormal results are displayed)  Labs Reviewed - No data to display ____________________________________________  EKG   ____________________________________________  RADIOLOGY  ED MD interpretation:    Official radiology report(s): Dg Cervical Spine 2-3 Views  Result Date: 11/22/2018 CLINICAL DATA:  Pain post fall EXAM: CERVICAL SPINE - 2-3 VIEW COMPARISON:  None FINDINGS: Prevertebral soft tissues normal thickness. Osseous mineralization normal. Vertebral body and disc space heights maintained. No fracture, subluxation, or bone destruction. C1-C2 alignment normal. Vascular stents identified at the LEFT subclavian and axillary region. IMPRESSION: No acute osseous abnormalities. Electronically Signed   By: Lavonia Dana M.D.   On: 11/22/2018 12:30   Dg Knee Complete 4 Views Right  Result Date: 11/22/2018 CLINICAL DATA:  Fall, right knee pain EXAM: RIGHT KNEE - COMPLETE 4+ VIEW COMPARISON:  09/30/2017 FINDINGS: Joint space narrowing. No acute bony abnormality. Specifically, no fracture, subluxation, or dislocation. No joint effusion. IMPRESSION: No acute bony abnormality. Electronically Signed   By: Rolm Baptise M.D.   On: 11/22/2018 12:29   Dg Hip Unilat  W Or Wo Pelvis 2-3 Views Right  Result Date: 11/22/2018 CLINICAL DATA:  Right hip pain, fall EXAM: DG HIP (WITH OR WITHOUT PELVIS) 2-3V RIGHT COMPARISON:  None. FINDINGS: Mild joint space narrowing and early spurring in the hip joints bilaterally. SI joints symmetric and unremarkable. No acute bony abnormality. Specifically, no fracture, subluxation, or dislocation. IMPRESSION: Early osteoarthritis.  No acute bony abnormality. Electronically Signed   By: Rolm Baptise M.D.   On: 11/22/2018 12:29    ____________________________________________   PROCEDURES  Procedure(s) performed (including Critical Care):  Procedures   ____________________________________________   INITIAL IMPRESSION / ASSESSMENT AND PLAN / ED COURSE  As part of my medical decision making, I reviewed the following data within the [facility]         Patient presents with neck pain, right hip pain, and right knee pain secondary to a fall.  Discussed x-ray findings with patient showing no acute abnormalities.  Patient complains consistent musculoskeletal pain.  Patient advised continue prescribed narcotic pain medication and start Flexeril as directed.  Follow-up with PCP.      ____________________________________________   FINAL CLINICAL IMPRESSION(S) / ED DIAGNOSES  Final diagnoses:  Fall, initial encounter  Musculoskeletal pain of lower extremity, right     ED Discharge Orders         Ordered    cyclobenzaprine (FLEXERIL) 10 MG tablet  3 times daily PRN     11/22/18 1255           Note:  This document was prepared using Dragon voice recognition software and may include unintentional dictation errors.    Sable Feil, PA-C 11/22/18 1257    Carrie Mew, MD 11/26/18 2143

## 2018-11-22 NOTE — ED Triage Notes (Signed)
Arrived by EMS. Patient was walking with cane to get onto transport bus to dialysis and tripped over ramp and caused her to fall, landing on Right knee. No neck pain or back pain. HX diabetes, HTN. Access in Left arm. Denies hitting head on fall. No LOC. EMS vitals148/61b/p, 77HR, 20RR, 100% RA

## 2018-12-07 ENCOUNTER — Other Ambulatory Visit: Payer: Self-pay

## 2018-12-07 ENCOUNTER — Ambulatory Visit
Admission: RE | Admit: 2018-12-07 | Discharge: 2018-12-07 | Disposition: A | Discharge status: Home / Self Care | Payer: Medicare Other | Source: Ambulatory Visit | Attending: Urology | Admitting: Urology

## 2018-12-07 DIAGNOSIS — N2889 Other specified disorders of kidney and ureter: Secondary | ICD-10-CM | POA: Diagnosis not present

## 2018-12-07 MED ORDER — IOPAMIDOL (ISOVUE-300) INJECTION 61%: 100.0000 mL | Freq: Once | INTRAVENOUS | Status: DC | PRN

## 2018-12-07 MED ORDER — IOHEXOL 300 MG/ML  SOLN
100.0000 mL | Freq: Once | INTRAMUSCULAR | Status: AC | PRN
  Administered 2018-12-07: 100 mL via INTRAVENOUS

## 2018-12-14 ENCOUNTER — Telehealth (INDEPENDENT_AMBULATORY_CARE_PROVIDER_SITE_OTHER): Payer: Medicare Other | Admitting: Urology

## 2018-12-14 ENCOUNTER — Other Ambulatory Visit: Payer: Self-pay

## 2018-12-14 ENCOUNTER — Ambulatory Visit: Payer: Self-pay | Admitting: Urology

## 2018-12-14 DIAGNOSIS — N2889 Other specified disorders of kidney and ureter: Secondary | ICD-10-CM

## 2018-12-14 DIAGNOSIS — N186 End stage renal disease: Secondary | ICD-10-CM | POA: Diagnosis not present

## 2018-12-14 DIAGNOSIS — J449 Chronic obstructive pulmonary disease, unspecified: Secondary | ICD-10-CM | POA: Diagnosis not present

## 2018-12-14 DIAGNOSIS — I509 Heart failure, unspecified: Secondary | ICD-10-CM | POA: Diagnosis not present

## 2018-12-14 NOTE — Progress Notes (Signed)
Virtual Visit via Telephone Note  I connected with SHELDON AMARA on 12/14/18 at  9:30 AM EDT by telephone and verified that I am speaking with the correct person using two identifiers.   I discussed the limitations, risks, security and privacy concerns of performing an evaluation and management service by telephone and the availability of in person appointments. I also discussed with the patient that there may be a patient responsible charge related to this service. The patient expressed understanding and agreed to proceed.   History of Present Illness: 58 year old female with multiple medical comorbidities including end-stage renal disease on dialysis, COPD and CHF is being followed for right renal mass x2.  The purpose of the visit today is to discuss follow-up imaging in the form of CT abdomen with and without contrast performed on 12/07/2018.  Most recent CT scan from 12/07/2018 shows interval growth of the lesion since 2018, the right medial upper pole lesion now measures 14 x 13 x 12.5 mm, up from 11.5 x 10.5 x 9.5 in 2019.  In addition, an enhancing upper mid pole posterior lesion now measures 12.0 x 10.5 mm x 15 mm, up from 11.5 x 10.0 x 13 mm.  Images were personally reviewed today, agree with radiologic interpretation.  This is also compared to numerous previous CT scans.  Denies any flank pain or gross hematuria.   Assessment and Plan:  1. Renal mass Small right enhancing renal mass x2, approximately ~53mm/year growth rate We discussed that these are in fact likely small renal cell carcinomas, no evidence of metastatic disease at this point Again today, given her multiple medical comorbidities and relatively slow growth rate, I would favor continued surveillance for the time being especially in light of current situation where all elective/nonurgent procedures are being postponed I recommended that she follow-up again in 6 months with a CT abdomen with and without contrast to  continue to survey and assess growth rate of these lesions May consider right nephrectomy down the road if the lesions continue to grow and she is deemed appropriate surgical candidate at that time She is agreeable with this plan   Follow Up Instructions: F/u 6 month with CT abd with and without   I discussed the assessment and treatment plan with the patient. The patient was provided an opportunity to ask questions and all were answered. The patient agreed with the plan and demonstrated an understanding of the instructions.   The patient was advised to call back or seek an in-person evaluation if the symptoms worsen or if the condition fails to improve as anticipated.  I provided 15 minutes of non-face-to-face time during this encounter.   Hollice Espy, MD

## 2018-12-28 ENCOUNTER — Other Ambulatory Visit: Payer: Self-pay

## 2018-12-28 ENCOUNTER — Inpatient Hospital Stay: Payer: Medicare Other | Attending: Oncology

## 2018-12-28 DIAGNOSIS — I5032 Chronic diastolic (congestive) heart failure: Secondary | ICD-10-CM | POA: Diagnosis not present

## 2018-12-28 DIAGNOSIS — D631 Anemia in chronic kidney disease: Secondary | ICD-10-CM | POA: Insufficient documentation

## 2018-12-28 DIAGNOSIS — E1122 Type 2 diabetes mellitus with diabetic chronic kidney disease: Secondary | ICD-10-CM | POA: Diagnosis not present

## 2018-12-28 DIAGNOSIS — Z794 Long term (current) use of insulin: Secondary | ICD-10-CM | POA: Insufficient documentation

## 2018-12-28 DIAGNOSIS — Z992 Dependence on renal dialysis: Secondary | ICD-10-CM | POA: Insufficient documentation

## 2018-12-28 DIAGNOSIS — I132 Hypertensive heart and chronic kidney disease with heart failure and with stage 5 chronic kidney disease, or end stage renal disease: Secondary | ICD-10-CM | POA: Insufficient documentation

## 2018-12-28 DIAGNOSIS — N185 Chronic kidney disease, stage 5: Secondary | ICD-10-CM | POA: Insufficient documentation

## 2018-12-28 LAB — CBC WITH DIFFERENTIAL/PLATELET
Abs Immature Granulocytes: 0.01 10*3/uL (ref 0.00–0.07)
Basophils Absolute: 0 10*3/uL (ref 0.0–0.1)
Basophils Relative: 1 %
Eosinophils Absolute: 0.5 10*3/uL (ref 0.0–0.5)
Eosinophils Relative: 11 %
HCT: 39.1 % (ref 36.0–46.0)
Hemoglobin: 12.3 g/dL (ref 12.0–15.0)
Immature Granulocytes: 0 %
Lymphocytes Relative: 39 %
Lymphs Abs: 2 10*3/uL (ref 0.7–4.0)
MCH: 30.7 pg (ref 26.0–34.0)
MCHC: 31.5 g/dL (ref 30.0–36.0)
MCV: 97.5 fL (ref 80.0–100.0)
Monocytes Absolute: 0.6 10*3/uL (ref 0.1–1.0)
Monocytes Relative: 13 %
Neutro Abs: 1.8 10*3/uL (ref 1.7–7.7)
Neutrophils Relative %: 36 %
Platelets: 213 10*3/uL (ref 150–400)
RBC: 4.01 MIL/uL (ref 3.87–5.11)
RDW: 15.5 % (ref 11.5–15.5)
WBC: 5 10*3/uL (ref 4.0–10.5)
nRBC: 0 % (ref 0.0–0.2)

## 2019-02-07 IMAGING — CT CT ABD-PELV W/O
2 of 4 series · 16 of 46 positions shown, 18 images · non-contrast
Comparison: CT abdomen dated March 02, 2018. CT abdomen pelvis dated
January 17, 2018.

CLINICAL DATA: Generalized abdominal pain with nausea, vomiting,
and diarrhea.

EXAM:
CT ABDOMEN AND PELVIS WITHOUT CONTRAST
TECHNIQUE: Multidetector CT imaging of the abdomen and pelvis was performed
following the standard protocol without IV contrast.

[Series 2: axial st · axial · 0.77mm/px · z∈[-944,-514]mm · 13 of 94 slices shown, 15 images]
[im 4/94  soft-tissue]
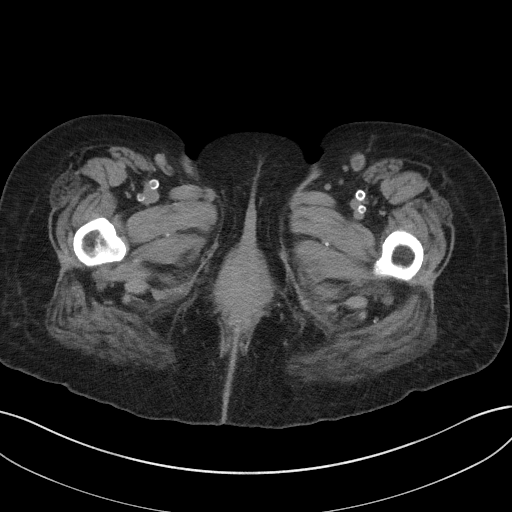
[im 4/94  bone]
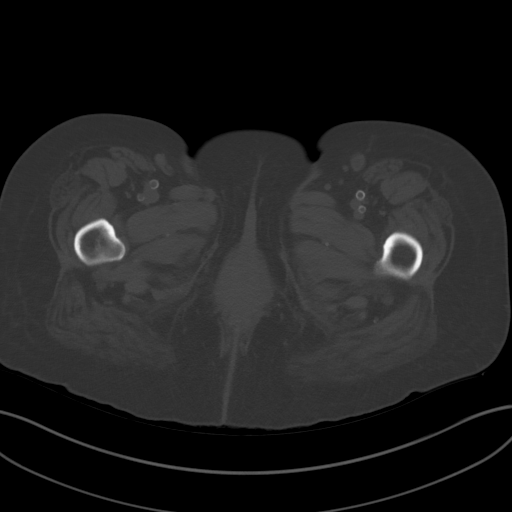
[im 12/94  soft-tissue]
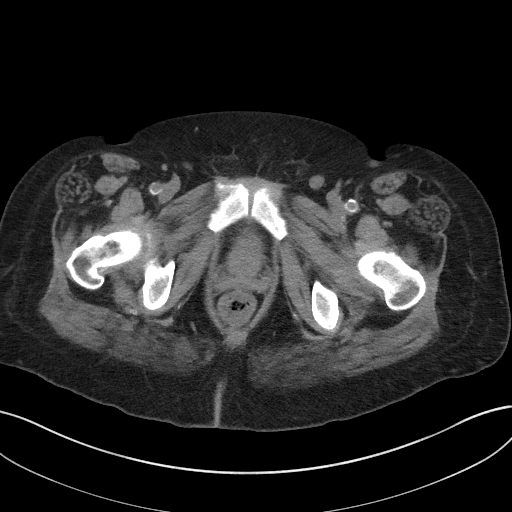
[im 19/94  soft-tissue]
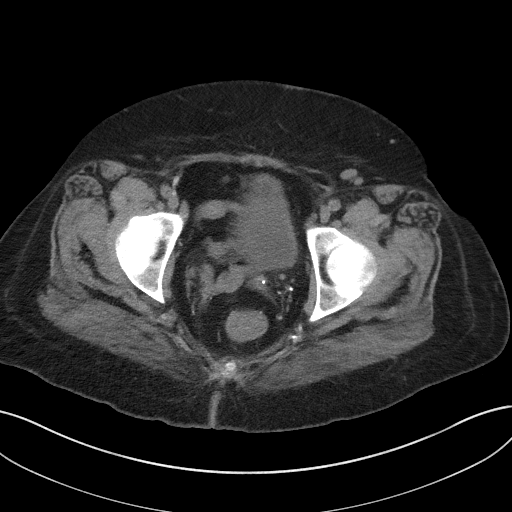
[im 27/94  soft-tissue]
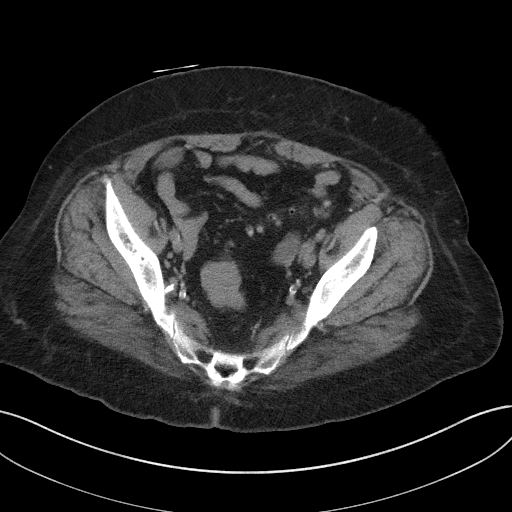
[im 34/94  soft-tissue]
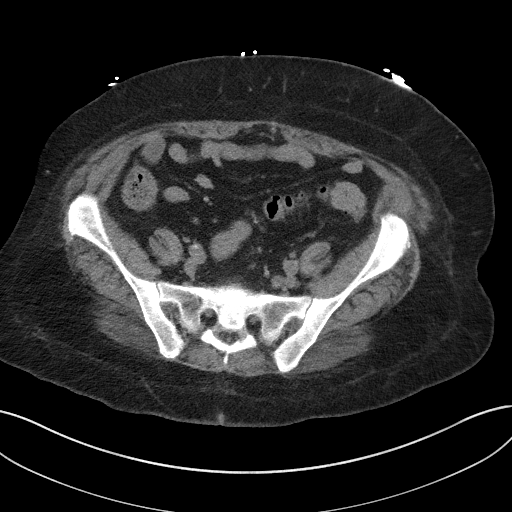
[im 41/94  soft-tissue]
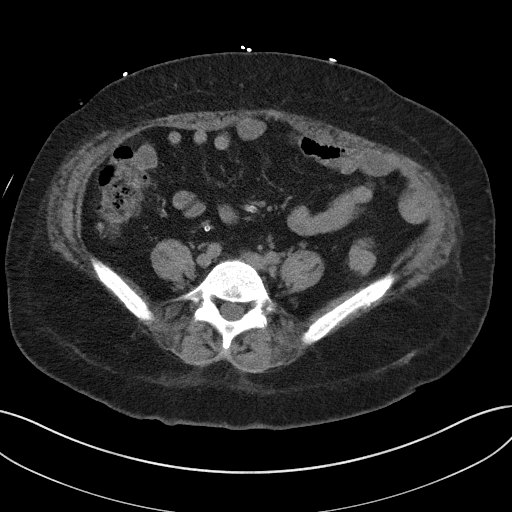
[im 49/94  soft-tissue]
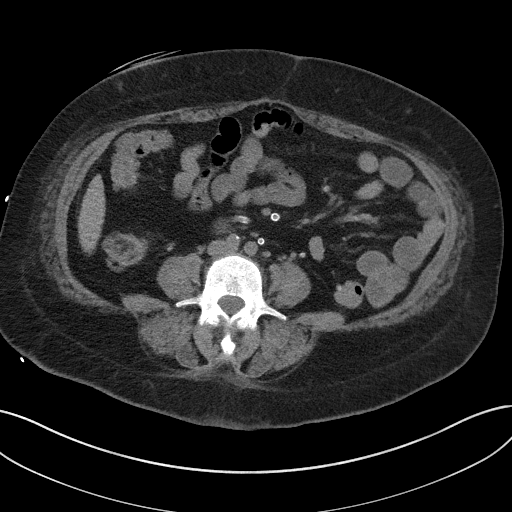
[im 53/94  soft-tissue]
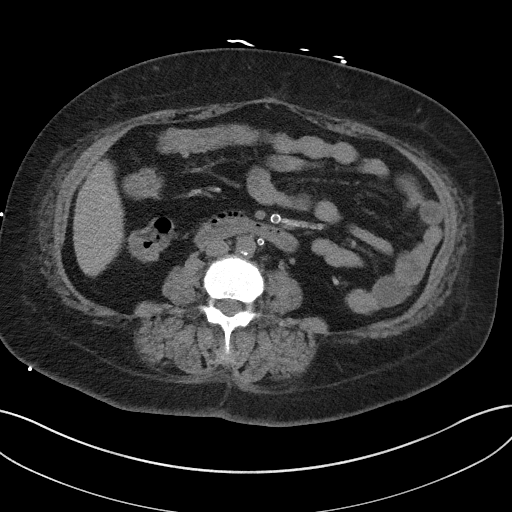
[im 60/94  soft-tissue]
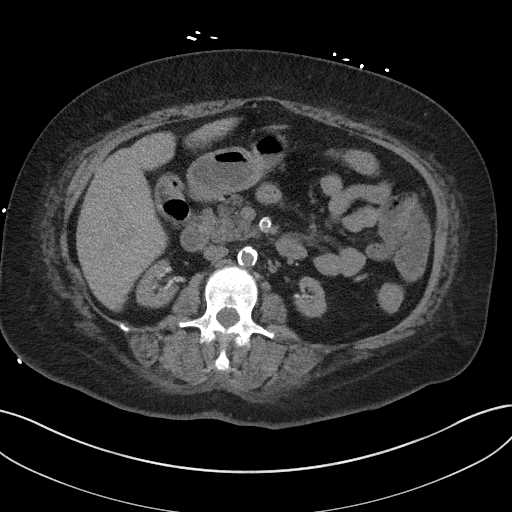
[im 60/94  bone]
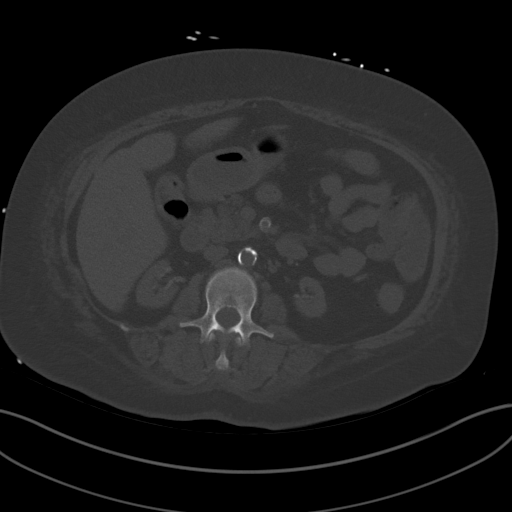
[im 67/94  soft-tissue]
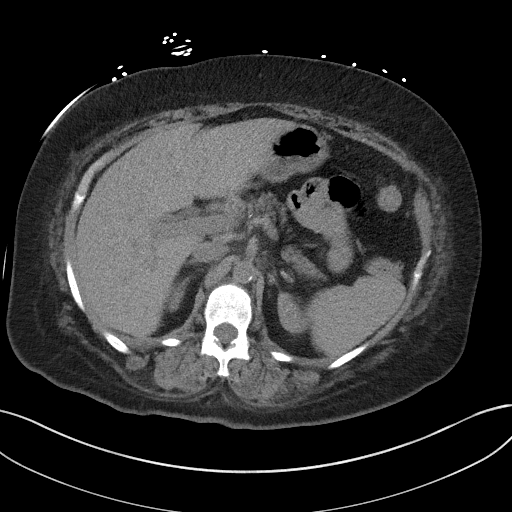
[im 75/94  soft-tissue]
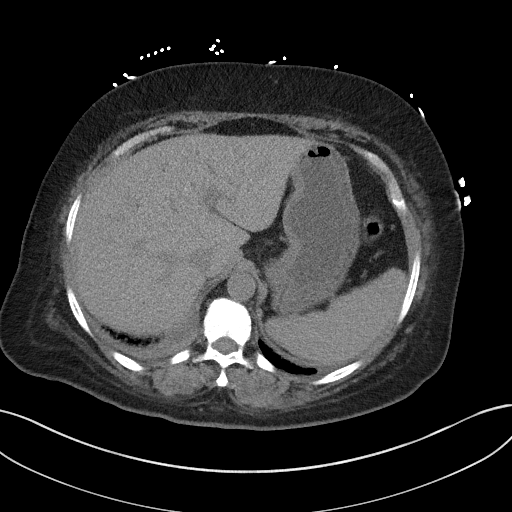
[im 82/94  soft-tissue]
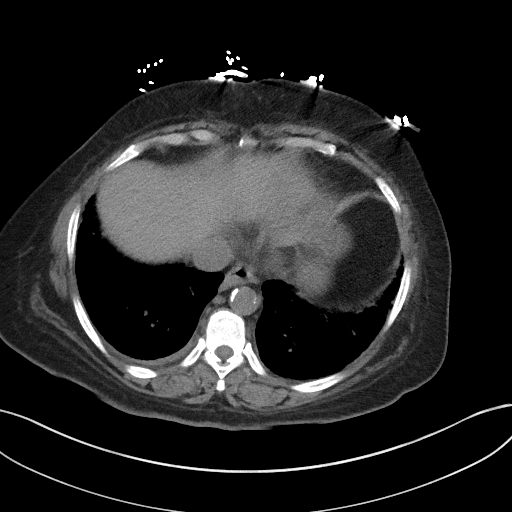
[im 90/94  soft-tissue]
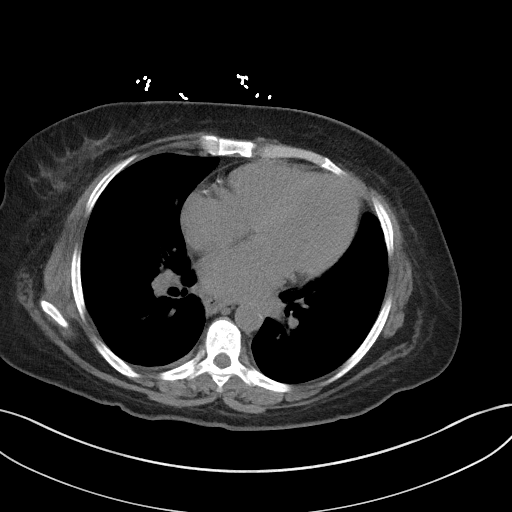

[Series 5: coronal st · coronal · 0.75mm/px · 3 of 86 slices shown]
[im 29/86  soft-tissue]
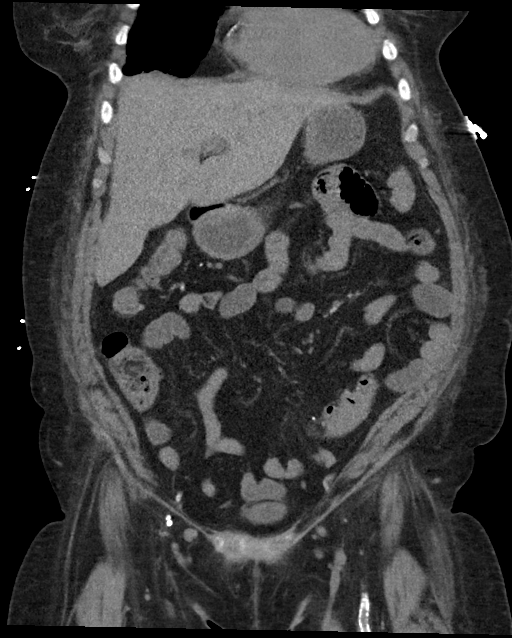
[im 38/86  soft-tissue]
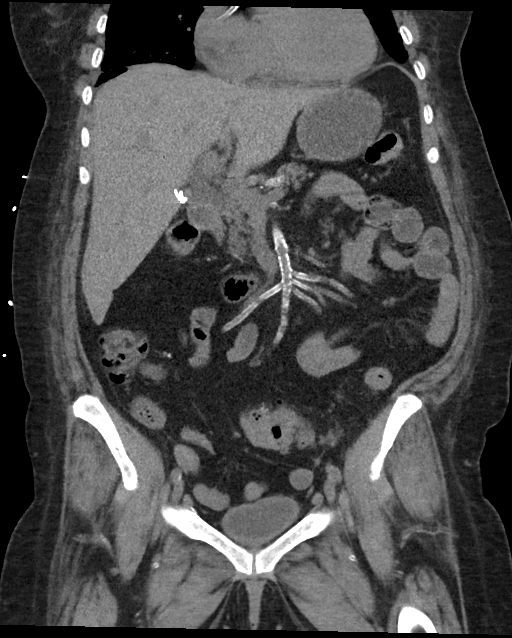
[im 48/86  soft-tissue]
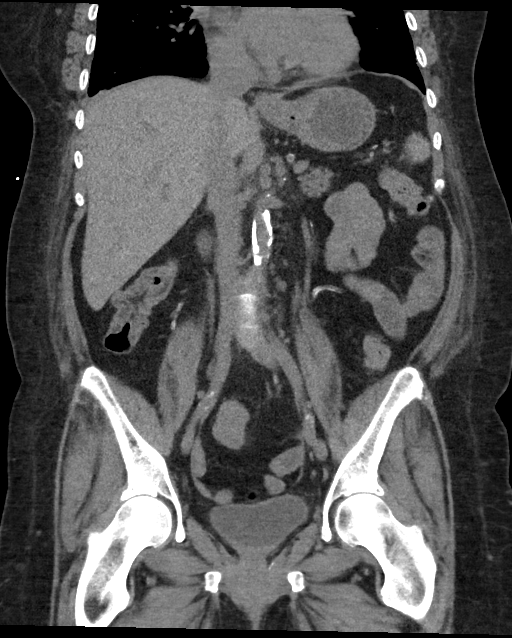

[16 of 46 positions shown; findings below may reference images not displayed]

FINDINGS: Lower chest: No acute abnormality. Mild subsegmental atelectasis at
the lung bases. Unchanged trace right pleural effusion. Unchanged
mild cardiomegaly.

Hepatobiliary: No focal liver abnormality is seen. No gallstones,
gallbladder wall thickening, or biliary dilatation.

Pancreas: Unremarkable. No pancreatic ductal dilatation or
surrounding inflammatory changes.

Spleen: Normal in size without focal abnormality.

Adrenals/Urinary Tract: The adrenal glands are unremarkable.
Unchanged severe bilateral renal atrophy. Known small right renal
masses are not well evaluated without intravenous contrast. No renal
calculi or hydronephrosis. The bladder is unremarkable.

Stomach/Bowel: Stomach is within normal limits. Appendix is
surgically absent. No evidence of bowel wall thickening, distention,
or inflammatory changes. Mild colonic diverticulosis.

Vascular/Lymphatic: Aortic atherosclerosis. No enlarged abdominal or
pelvic lymph nodes.

Reproductive: Status post hysterectomy. No adnexal masses.

Other: No abdominal wall hernia or abnormality. No abdominopelvic
ascites. No pneumoperitoneum.

Musculoskeletal: No acute or significant osseous findings.
IMPRESSION: 1.  No acute intra-abdominal process.
2. Unchanged severe bilateral renal atrophy. Known small right renal
masses are not well evaluated without intravenous contrast.
3. Unchanged trace right pleural effusion.
4.  Aortic atherosclerosis ([id number]).

## 2019-02-09 IMAGING — CT CT HEAD W/O CM
3 series · 15 of 47 positions shown, 18 images · non-contrast
Comparison: 08/14/2017

CLINICAL DATA: Headache and altered level of consciousness.

EXAM:
CT HEAD WITHOUT CONTRAST
TECHNIQUE: Contiguous axial images were obtained from the base of the skull
through the vertex without intravenous contrast.

[Series 3: head wo · axial · 0.42mm/px · z∈[-98,+27]mm · 9 of 31 slices shown, 12 images]
[im 3/31  brain]
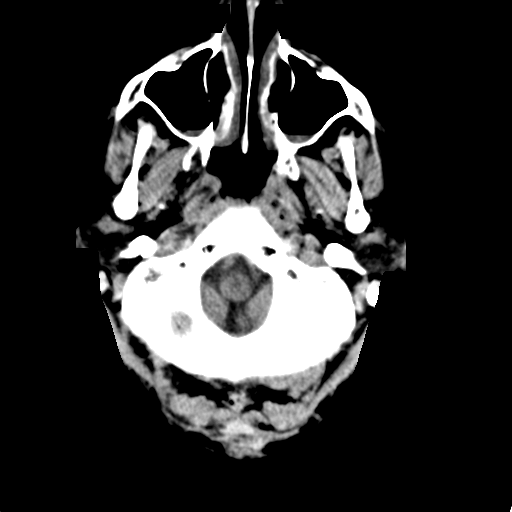
[im 3/31  bone]
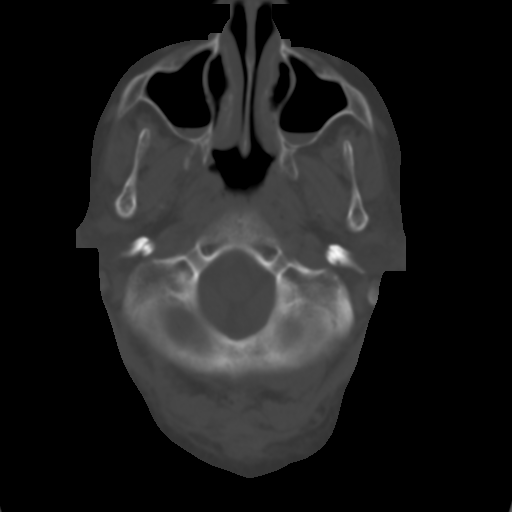
[im 6/31  brain]
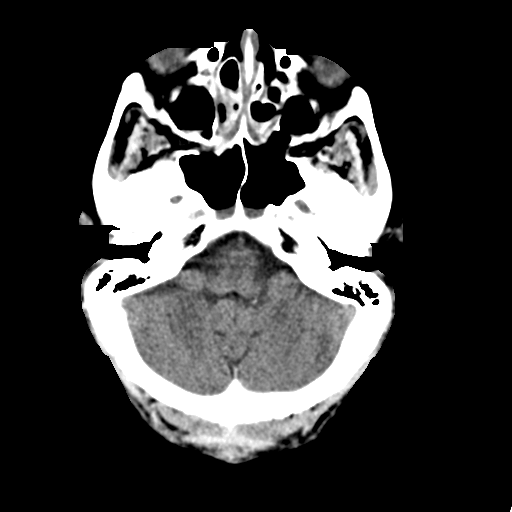
[im 9/31  brain]
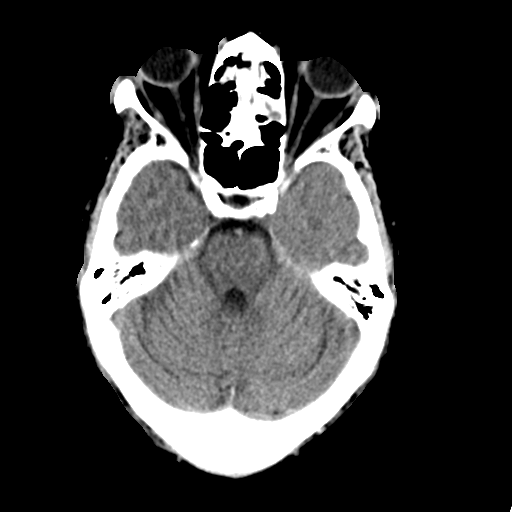
[im 12/31  brain]
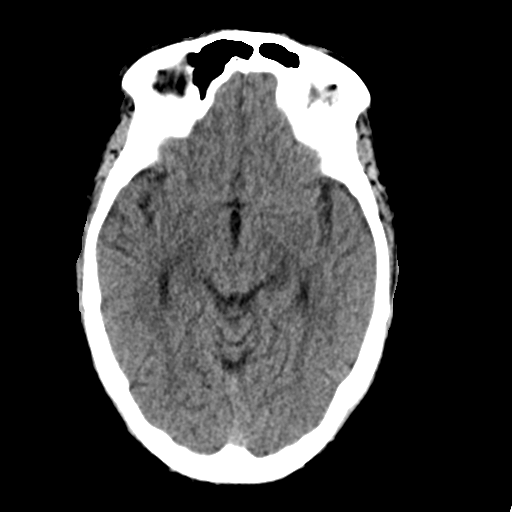
[im 16/31  brain]
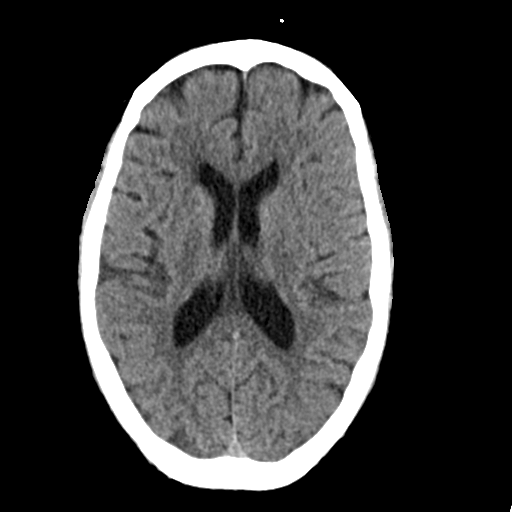
[im 16/31  bone]
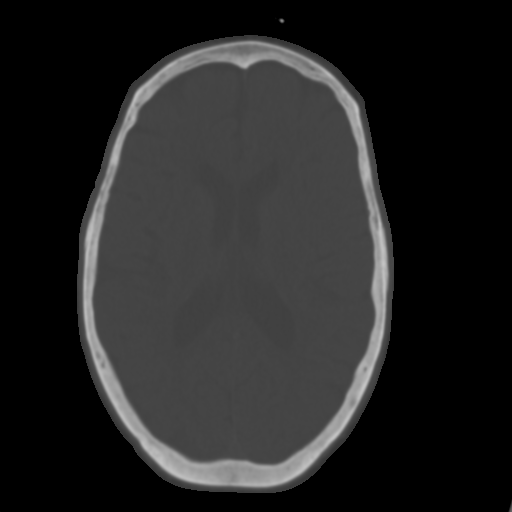
[im 19/31  brain]
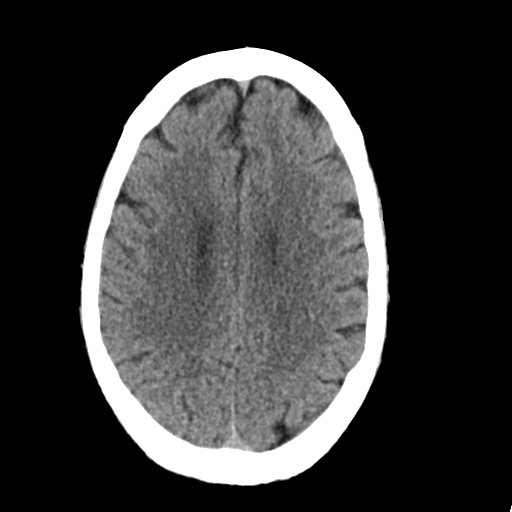
[im 22/31  brain]
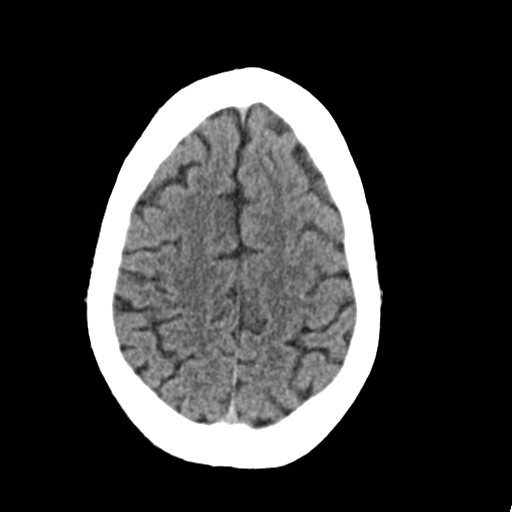
[im 25/31  brain]
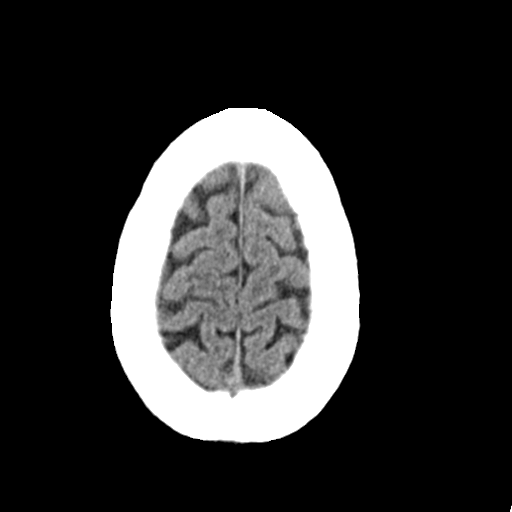
[im 28/31  brain]
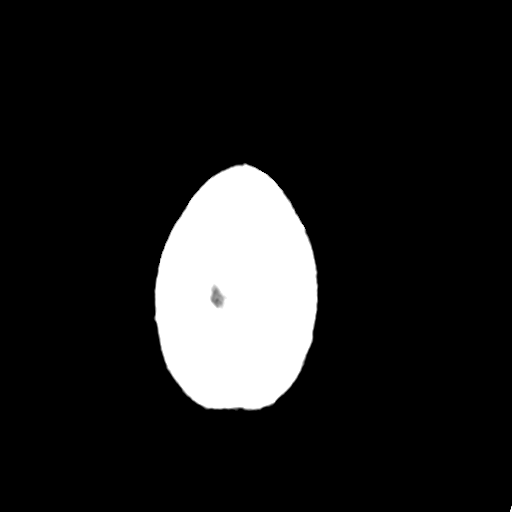
[im 28/31  bone]
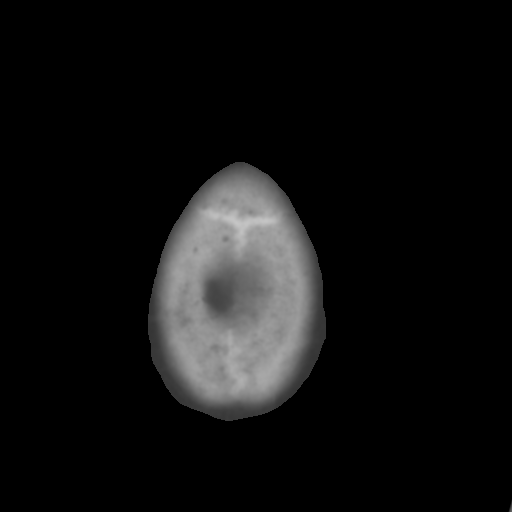

[Series 4: coronal soft tissue · coronal · 0.30mm/px · 3 of 72 slices shown]
[im 24/72  brain]
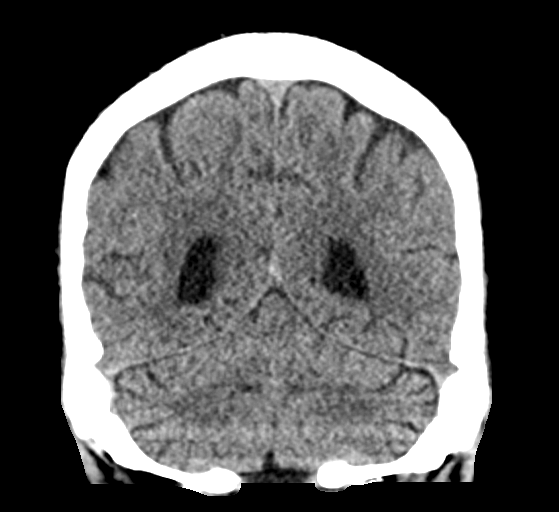
[im 32/72  brain]
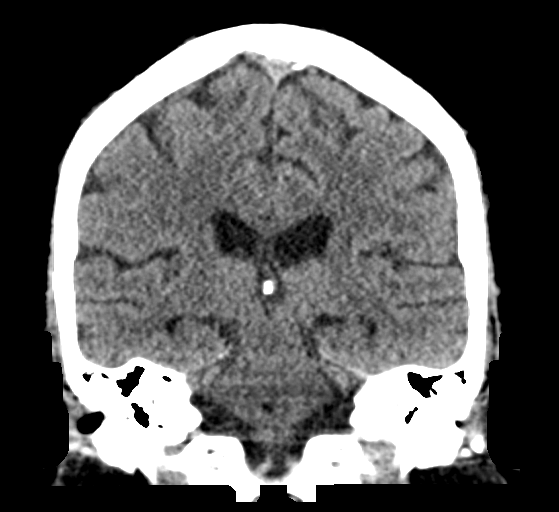
[im 40/72  brain]
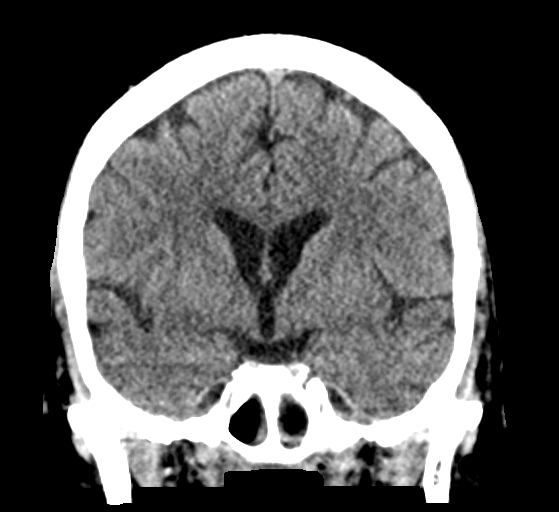

[Series 5: sagittal soft tissue · sagittal · 0.30mm/px · 3 of 57 slices shown]
[im 19/57  brain]
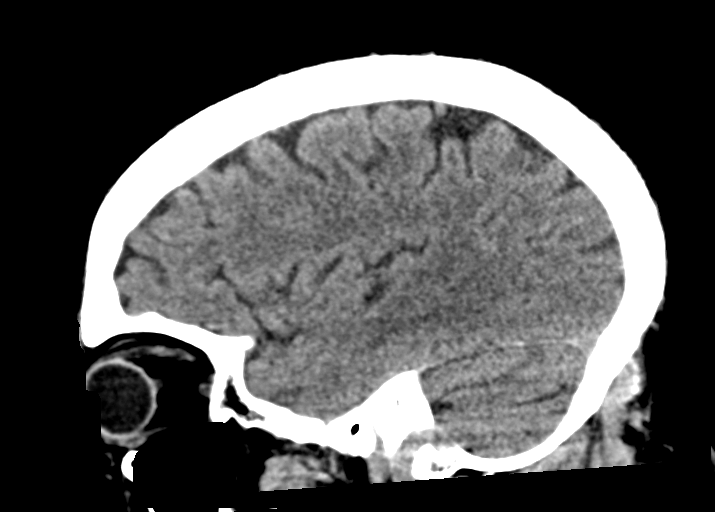
[im 29/57  brain]
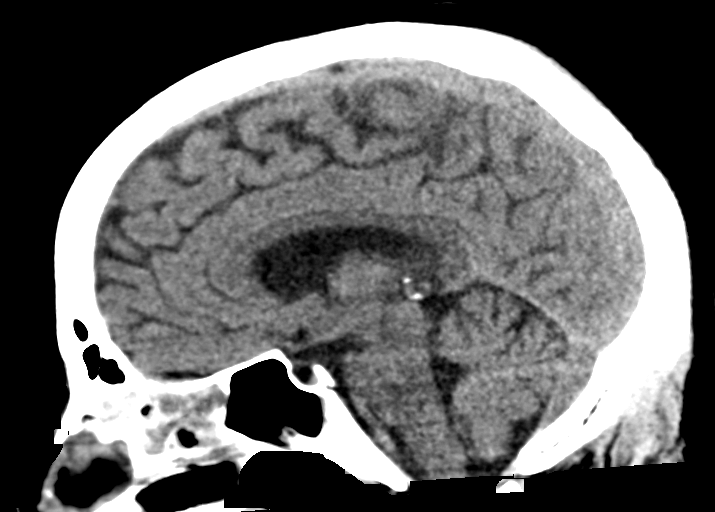
[im 38/57  brain]
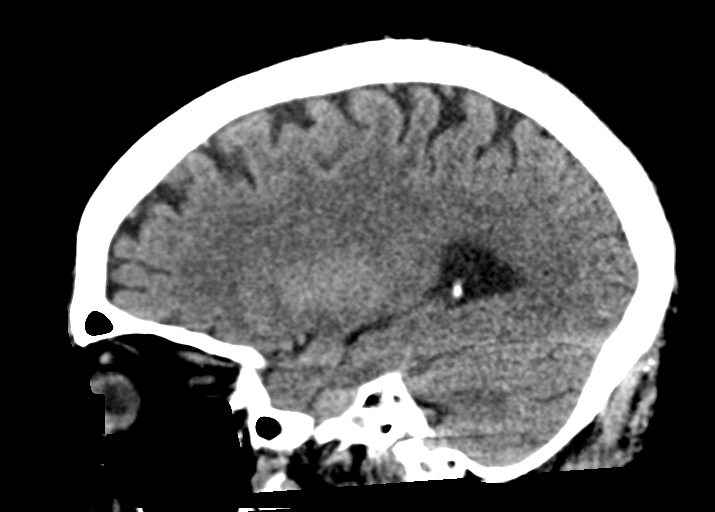

[15 of 47 positions shown; findings below may reference images not displayed]

FINDINGS: Brain: The brain shows a normal appearance without evidence of
malformation, atrophy, old or acute small or large vessel
infarction, mass lesion, hemorrhage, hydrocephalus or extra-axial
collection.

Vascular: No hyperdense vessel. No evidence of atherosclerotic
calcification.

Skull: Normal. No traumatic finding. No focal bone lesion.

Sinuses/Orbits: Sinuses are clear. Orbits appear normal. Mastoids
are clear.

Other: None significant
IMPRESSION: Normal head CT.

## 2019-02-09 IMAGING — CT CT ANGIO NECK
1 of 8 series · 6 of 33 positions shown · IV contrast (APPLIED)
Comparison: Head CT earlier same day. MRI 04/22/2016.

CLINICAL DATA: Sudden onset of unresponsiveness beginning this
morning. Negative head CT.

EXAM:
CT ANGIOGRAPHY HEAD AND NECK
TECHNIQUE: Multidetector CT imaging of the head and neck was performed using
the standard protocol during bolus administration of intravenous
contrast. Multiplanar CT image reconstructions and MIPs were
obtained to evaluate the vascular anatomy. Carotid stenosis
measurements (when applicable) are obtained utilizing NASCET
criteria, using the distal internal carotid diameter as the
denominator.
CONTRAST:  75mL OMNIPAQUE IOHEXOL 350 MG/ML SOLN

[Series 6: ax thin · axial · 0.55mm/px · z∈[-348,-112]mm · 6 of 322 slices shown]
[im 46/322  soft-tissue]
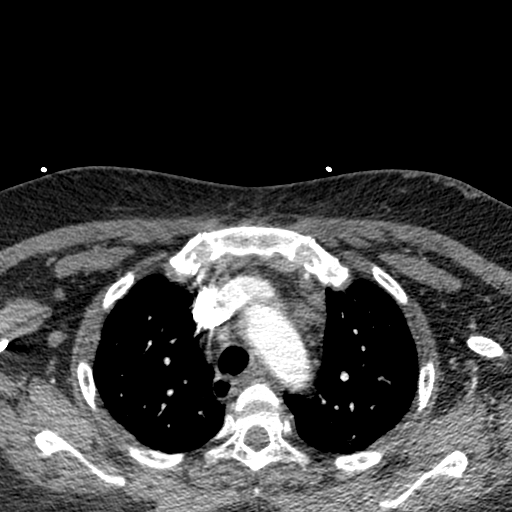
[im 92/322  bone]
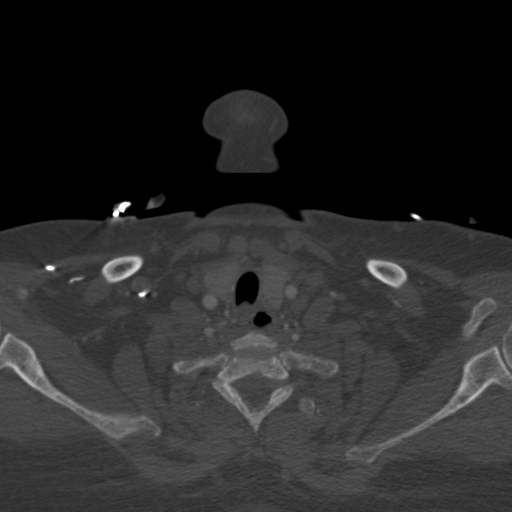
[im 138/322  soft-tissue]
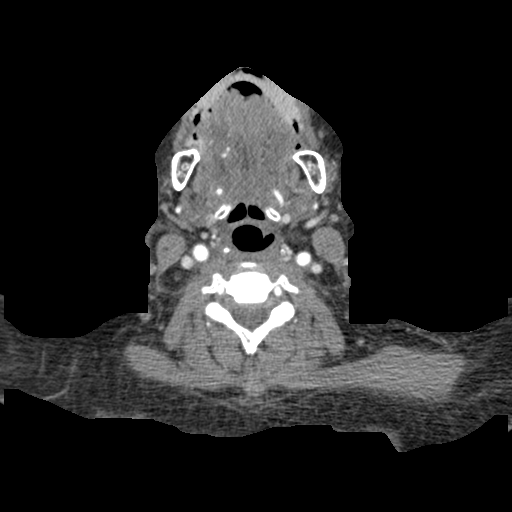
[im 184/322  bone]
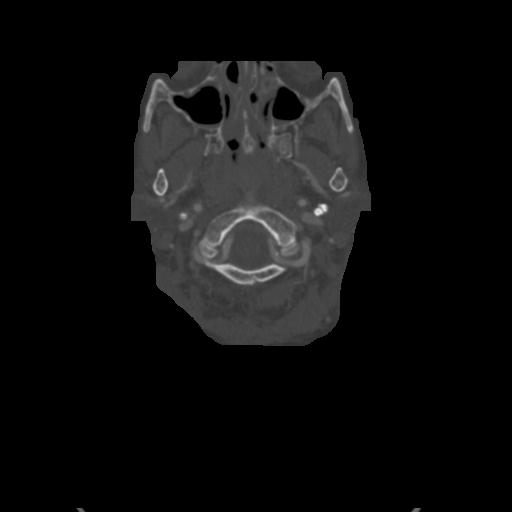
[im 230/322  soft-tissue]
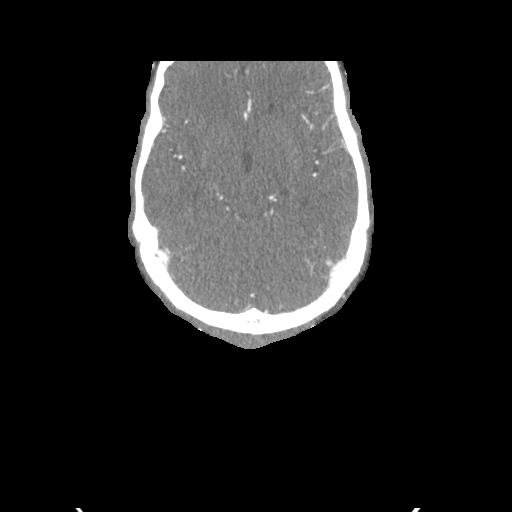
[im 276/322  bone]
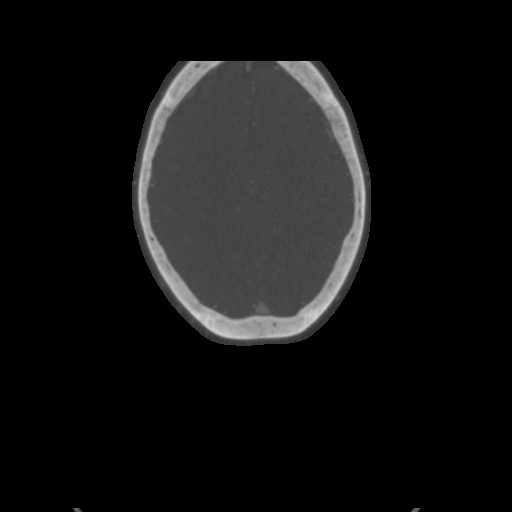

[6 of 33 positions shown; findings below may reference images not displayed]

FINDINGS: CTA NECK FINDINGS

Aortic arch: Mild aortic atherosclerosis. No aneurysm or dissection.
Branching pattern of the brachiocephalic vessels is normal. 20%
stenosis of the left subclavian artery origin.

Right carotid system: Common carotid artery widely patent to the
bifurcation. Soft and calcified plaque at the bifurcation and ICA
bulb. Minimal diameter in the ICA bulb is 1.8 mm. Compared to a more
distal cervical ICA diameter of 4.6 mm, this indicates a 60%
stenosis.

Left carotid system: Common carotid artery widely patent to the
bifurcation. Soft and calcified plaque at the carotid bifurcation
and ICA bulb. Minimal diameter of the ICA bulb is 2 mm. Compared to
a more distal cervical ICA diameter of 4 mm, this indicates a 50%
stenosis.

Vertebral arteries: Both vertebral artery origins are widely patent.
Both vertebral arteries are widely patent through the cervical
region to the foramen magnum.

Skeleton: Normal

Other neck: Normal

Upper chest: Normal

Review of the MIP images confirms the above findings

CTA HEAD FINDINGS

Anterior circulation: Both internal carotid arteries are patent
through the skull base and siphon regions. There is atherosclerotic
calcification throughout the carotid siphons but without stenosis
greater than 50% suspected. Supraclinoid ICA stenosis likely
approaches 70%. The anterior and middle cerebral vessels show flow.
No large or medium vessel occlusion identified. Atherosclerotic
narrowing of the left A1 segment.

Posterior circulation: Both vertebral arteries are patent to the
basilar. No basilar stenosis. Posterior circulation branch vessels
are patent.

Venous sinuses: Patent and normal.

Anatomic variants: None

Delayed phase: No abnormal enhancement.

Review of the MIP images confirms the above findings
IMPRESSION: 1. Atherosclerotic disease at both carotid bifurcations. 60%
stenosis of the proximal ICA on the right. 50% stenosis of the
proximal ICA on the left.
2. Atherosclerotic disease throughout the carotid siphons but
without stenosis greater than 50%. More severe stenosis of the
supraclinoid internal carotid arteries on each side, approximately
70%.
3. No intracranial large or medium vessel occlusion or correctable
proximal stenosis. No significant posterior circulation pathology.

## 2019-02-09 IMAGING — DX DG CHEST 1V PORT
1 series · 1 of 1 positions shown · non-contrast
Comparison: 06/17/2018

CLINICAL DATA: Sudden onset of mental status change.

EXAM:
PORTABLE CHEST 1 VIEW

[chest ap]
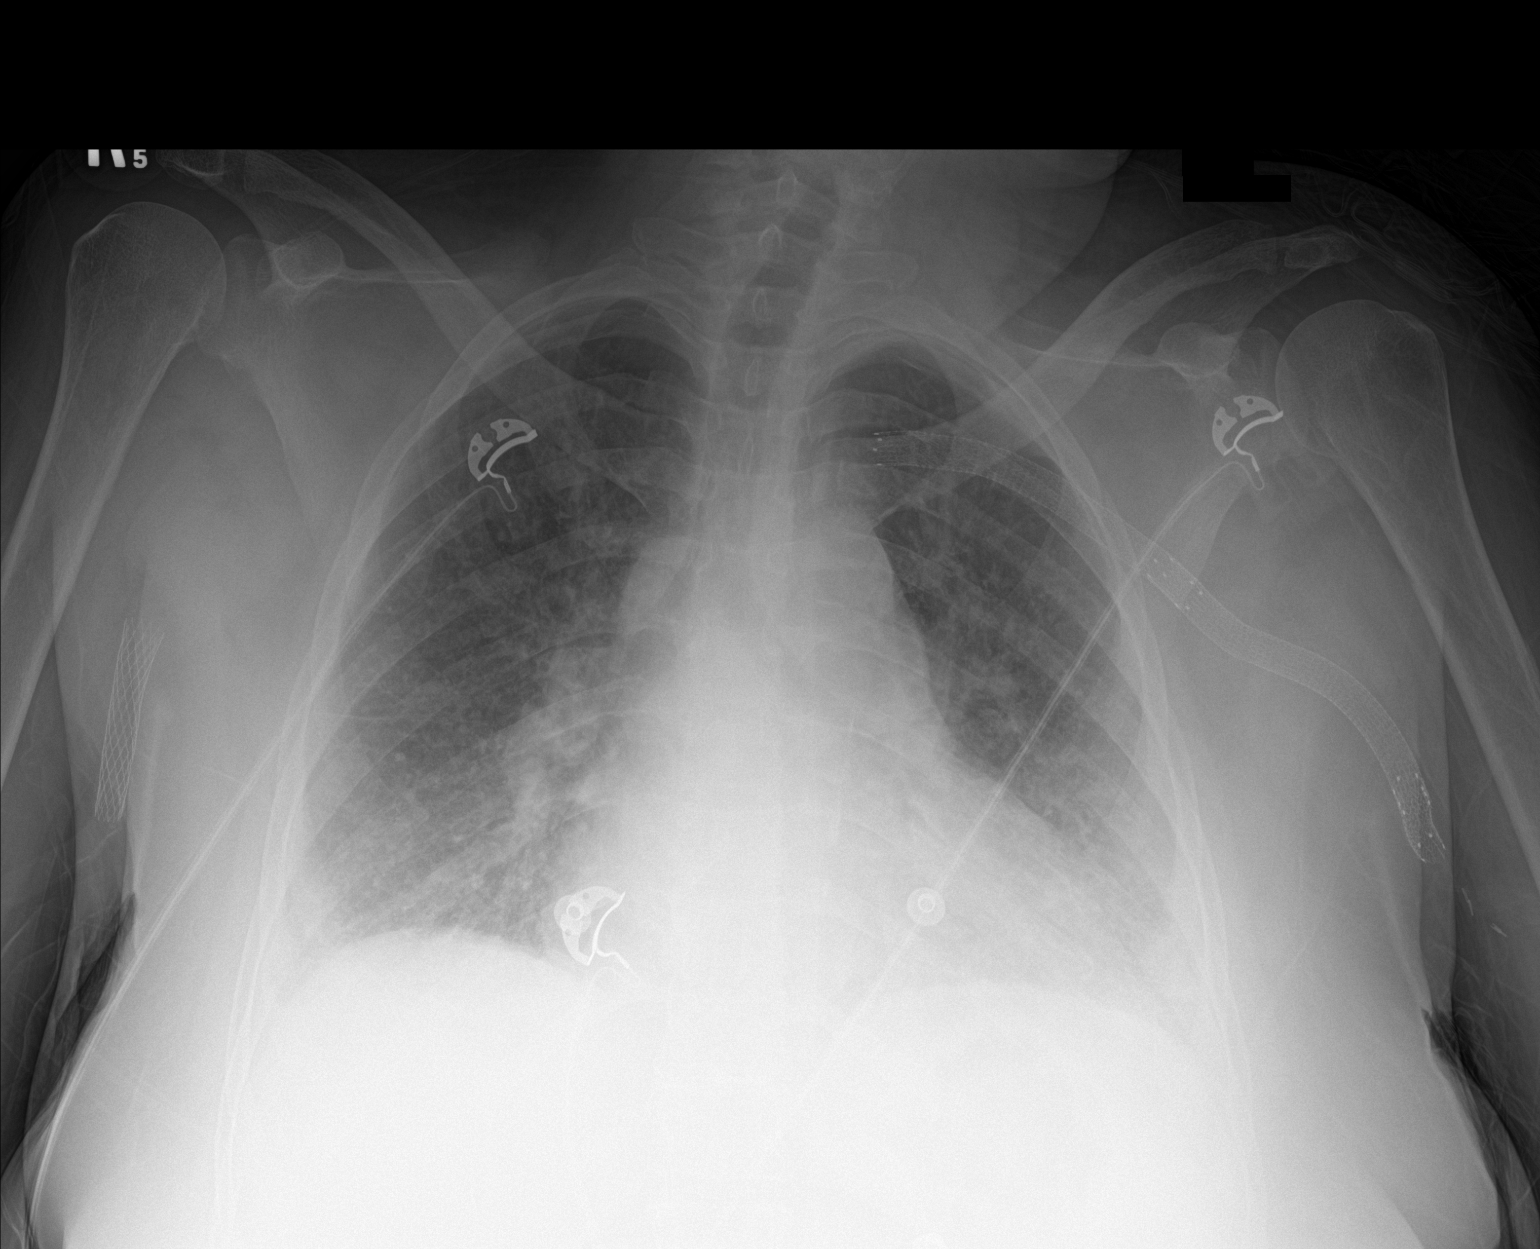

[1 of 1 positions shown; findings below may reference images not displayed]

FINDINGS: Heart size is normal. There is a poor inspiration. There may be
venous hypertension but there is no frank edema, infiltrate,
collapse or effusion. Vascular stent present on both sides,
presumably venous.
IMPRESSION: Poor inspiration. Possible venous hypertension without frank edema.

## 2019-02-14 IMAGING — CR DG CHEST 2V
1 series · 2 of 2 positions shown · non-contrast
Comparison: 10/04/2018

CLINICAL DATA: Cough, wheezing, altered mental status, end-stage
renal disease

EXAM:
CHEST - 2 VIEW

[Series 1: dg chest 2 view · 0.14mm/px · 2 of 2 slices shown]
[im 1/2]
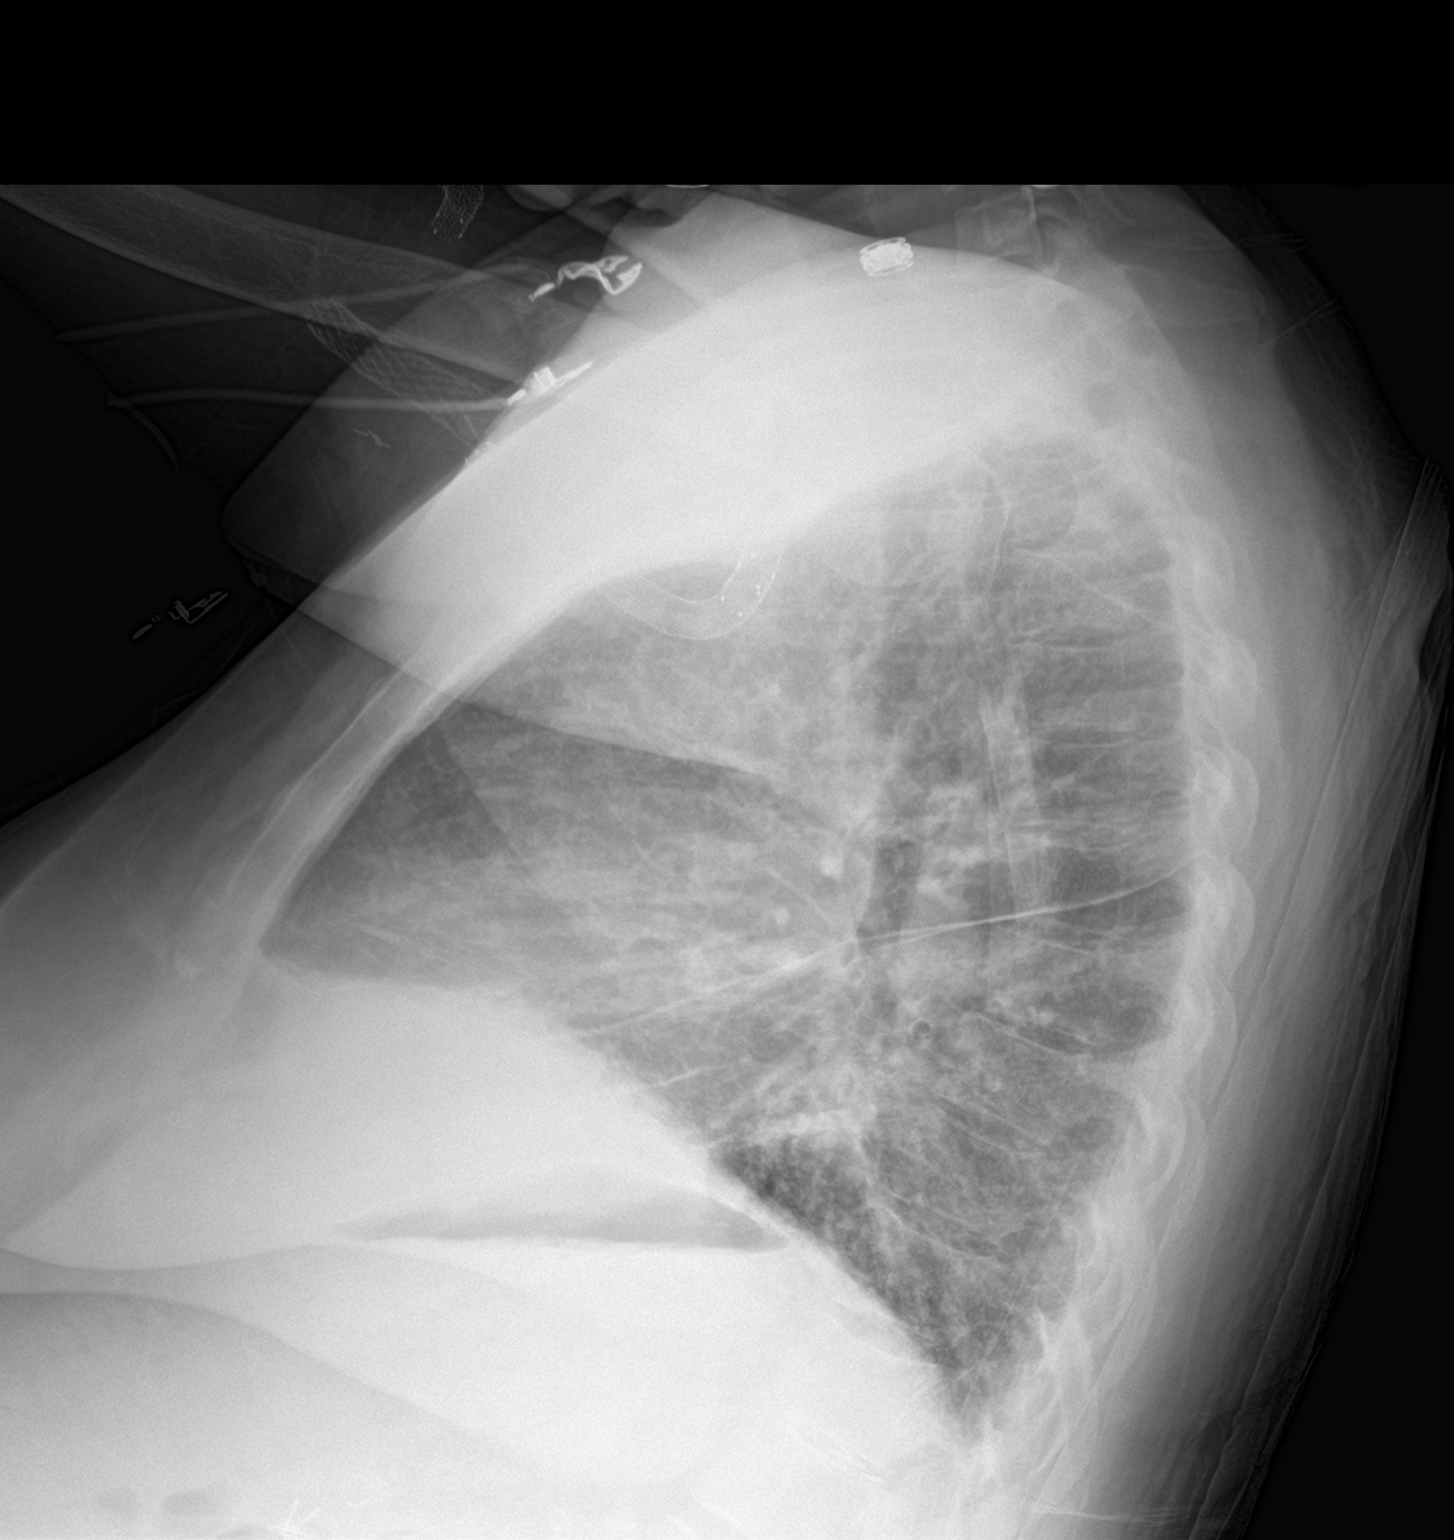
[im 2/2]
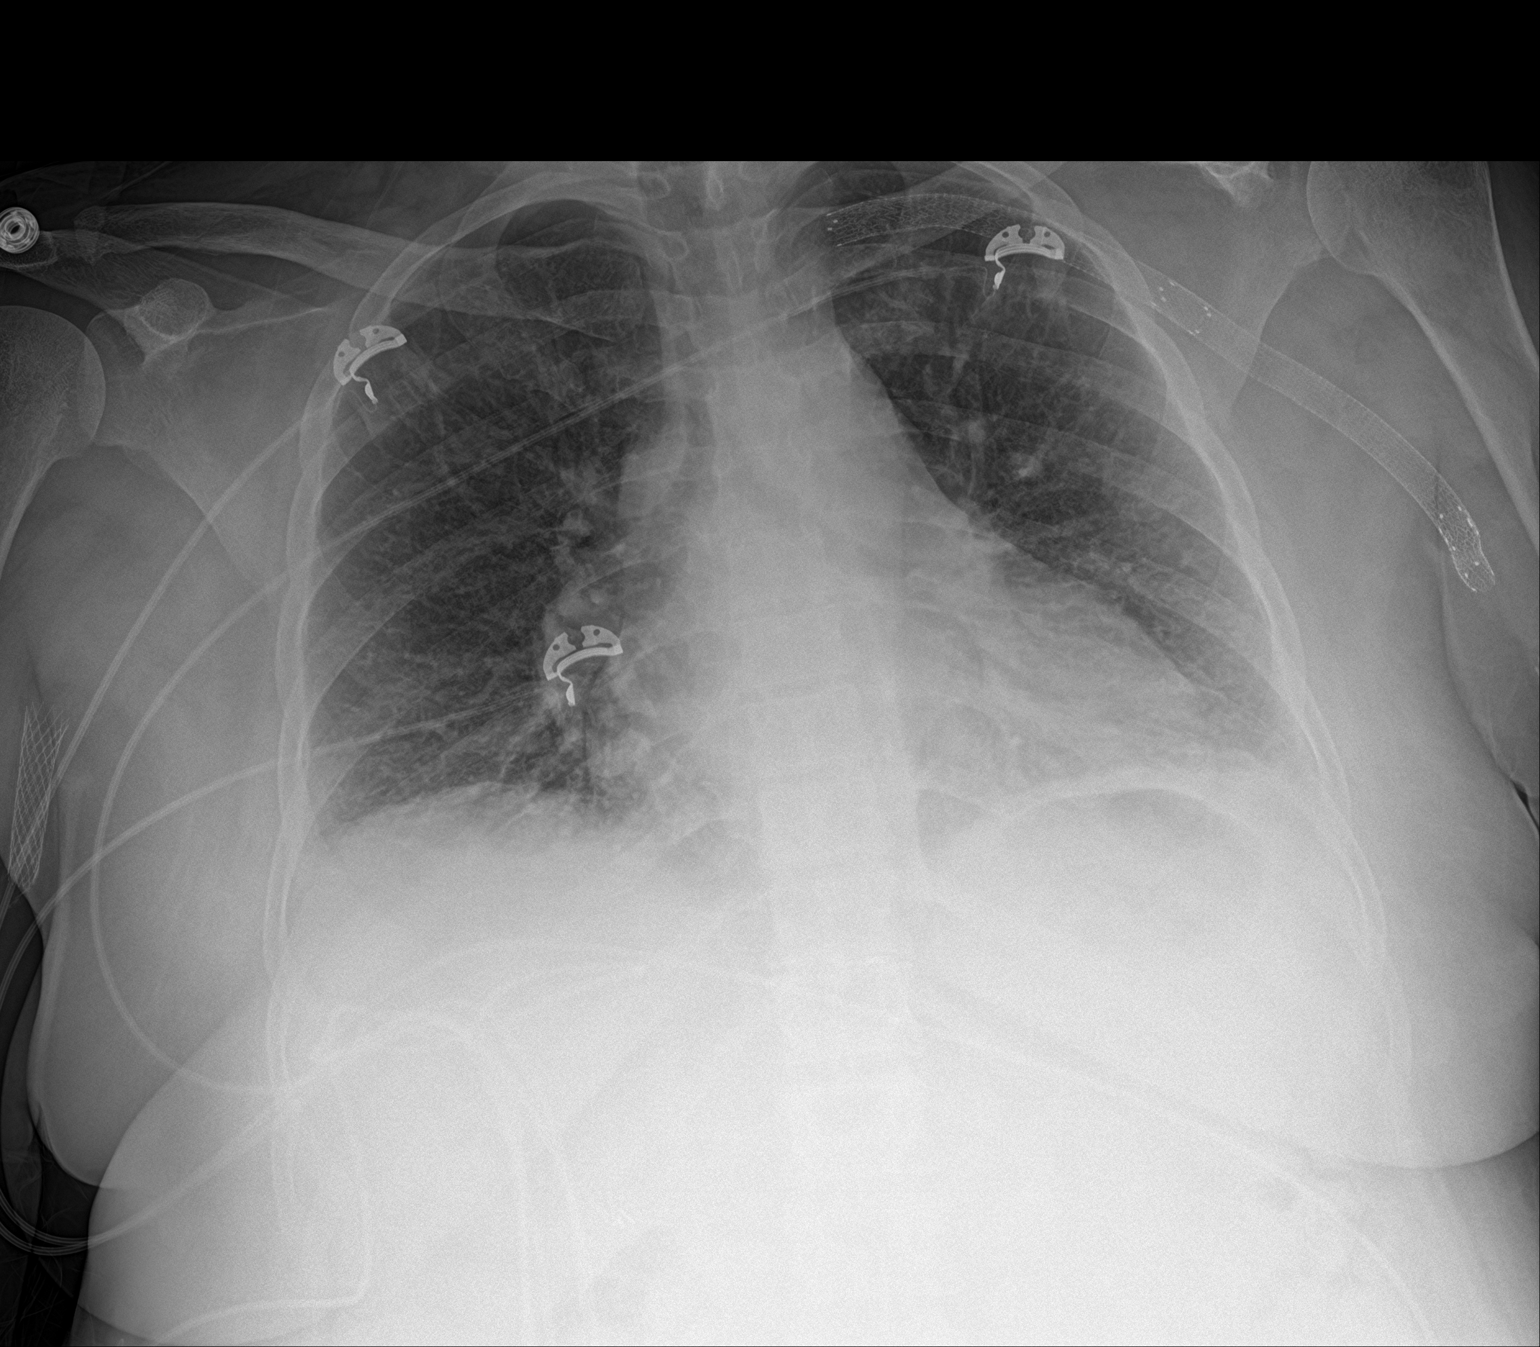

[2 of 2 positions shown; findings below may reference images not displayed]

FINDINGS: Left chest and axillary vascular stents noted related to prior
dialysis access intervention.

Heart is enlarged with vascular congestion and diffuse interstitial
opacities suggesting component of early CHF. Minor associated
basilar atelectasis. No large effusion. No pneumothorax. Trachea is
midline. No acute osseous finding.
IMPRESSION: Cardiomegaly with mild interstitial edema pattern and basilar
atelectasis.

## 2019-03-01 IMAGING — US US EXTREM LOW VENOUS BILAT
1 series · 13 of 24 positions shown · non-contrast
Comparison: None.

CLINICAL DATA: 57-year-old female with bilateral leg swelling and
cramping.

EXAM:
BILATERAL LOWER EXTREMITY VENOUS DOPPLER ULTRASOUND
TECHNIQUE: Gray-scale sonography with graded compression, as well as color
Doppler and duplex ultrasound were performed to evaluate the lower
extremity deep venous systems from the level of the common femoral
vein and including the common femoral, femoral, profunda femoral,
popliteal and calf veins including the posterior tibial, peroneal
and gastrocnemius veins when visible. The superficial great
saphenous vein was also interrogated. Spectral Doppler was utilized
to evaluate flow at rest and with distal augmentation maneuvers in
the common femoral, femoral and popliteal veins.

[Series 1: us extrem low venous bilat · 0.07mm/px · 13 of 57 slices shown]
[im 1/57]
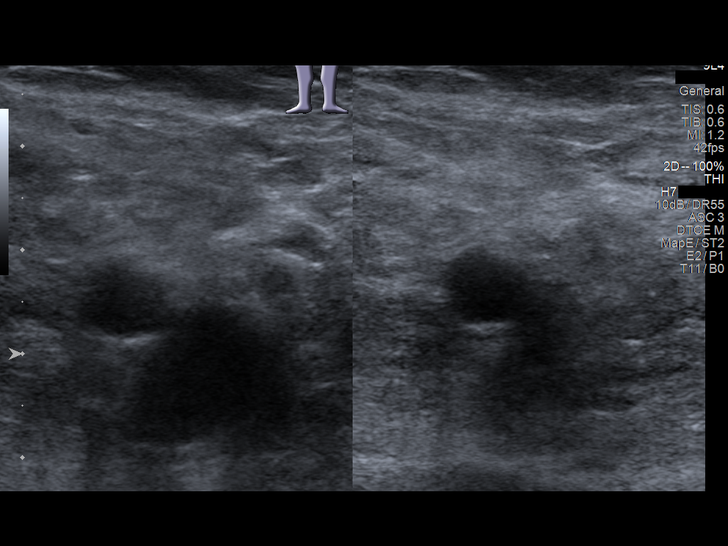
[im 5/57]
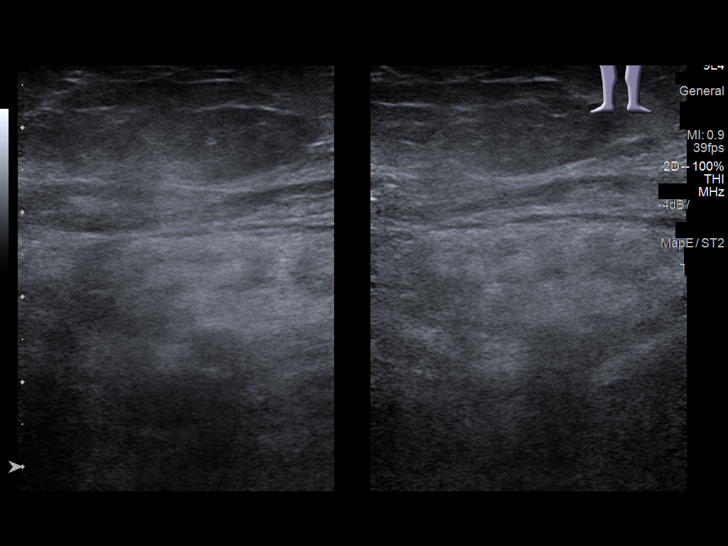
[im 10/57]
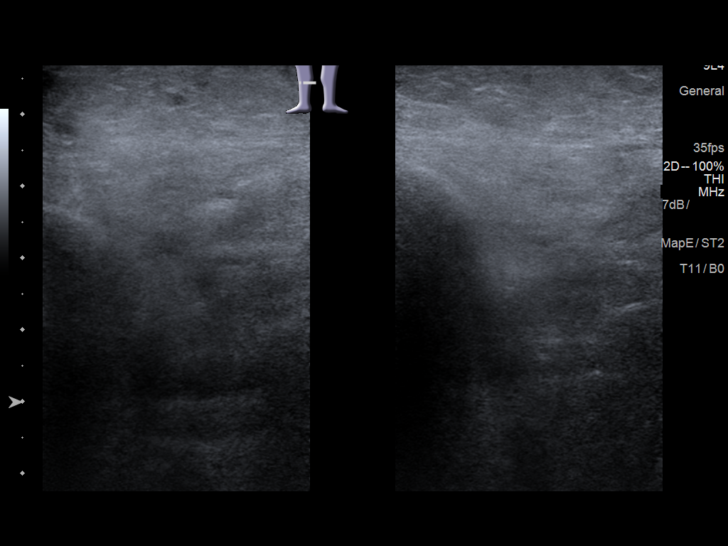
[im 15/57]
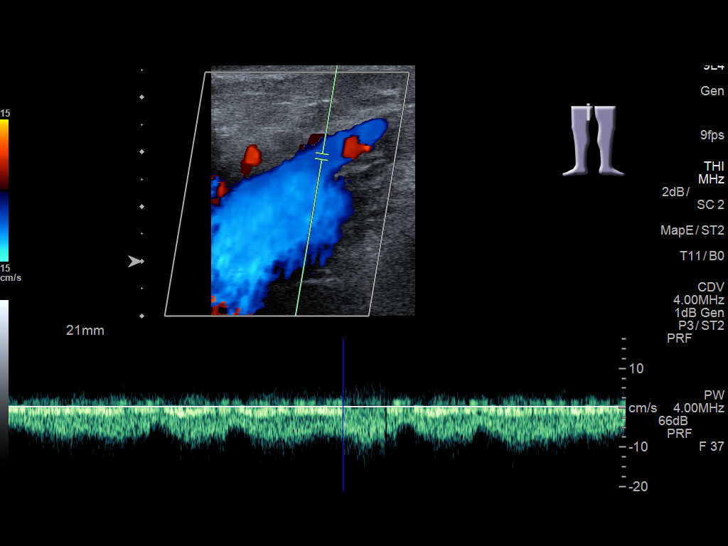
[im 20/57]
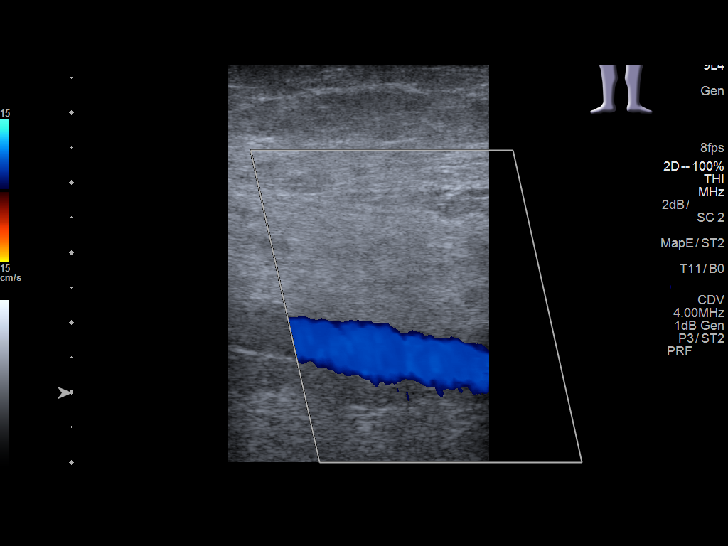
[im 25/57]
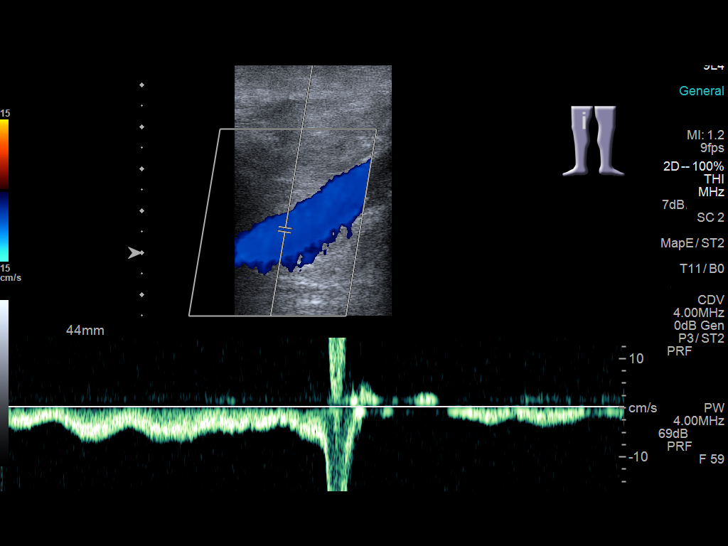
[im 30/57]
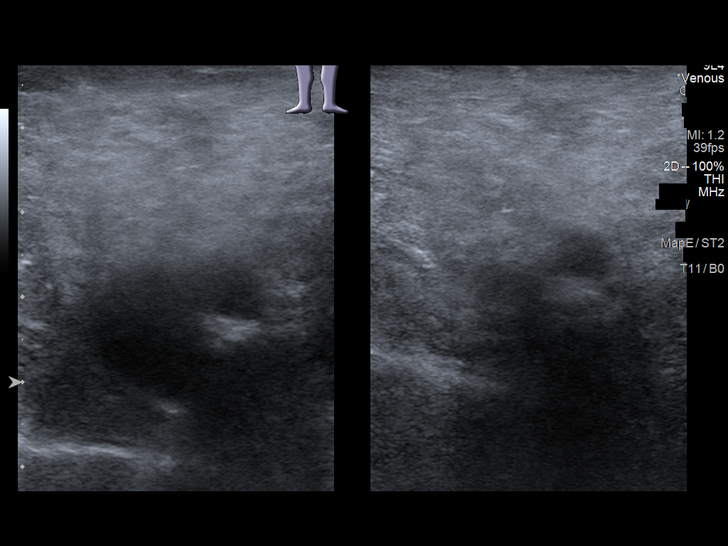
[im 32/57]
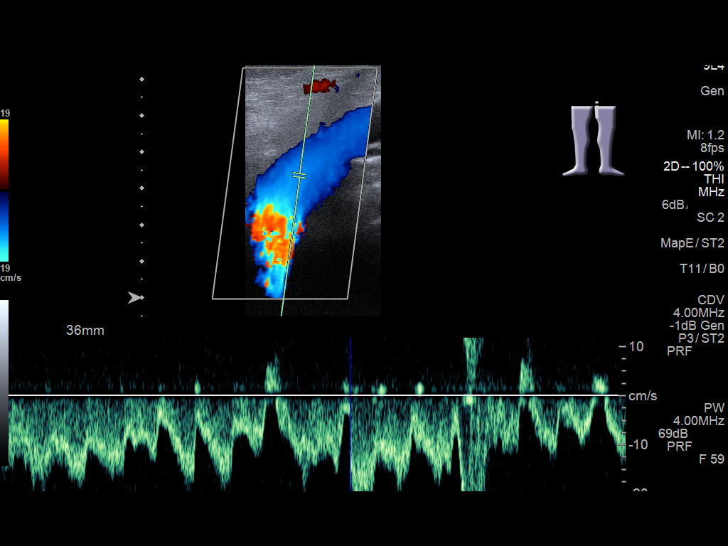
[im 37/57]
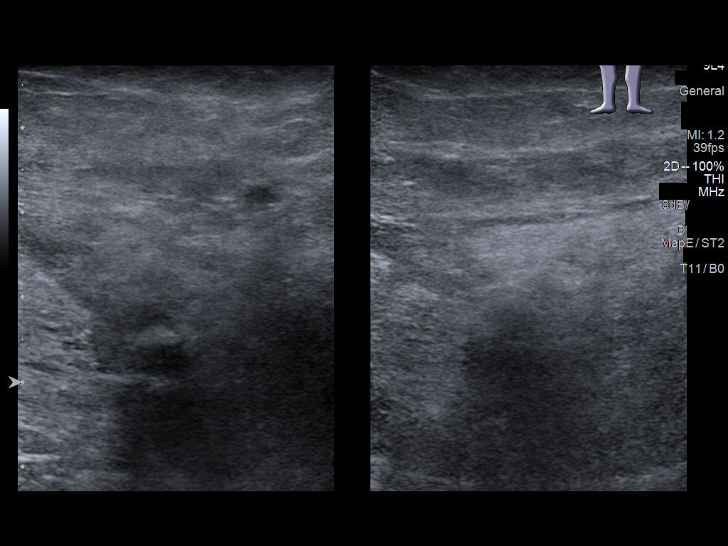
[im 42/57]
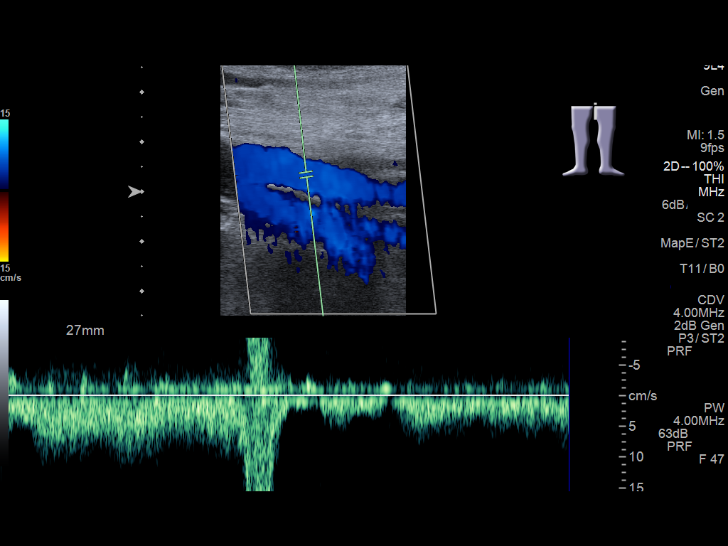
[im 47/57]
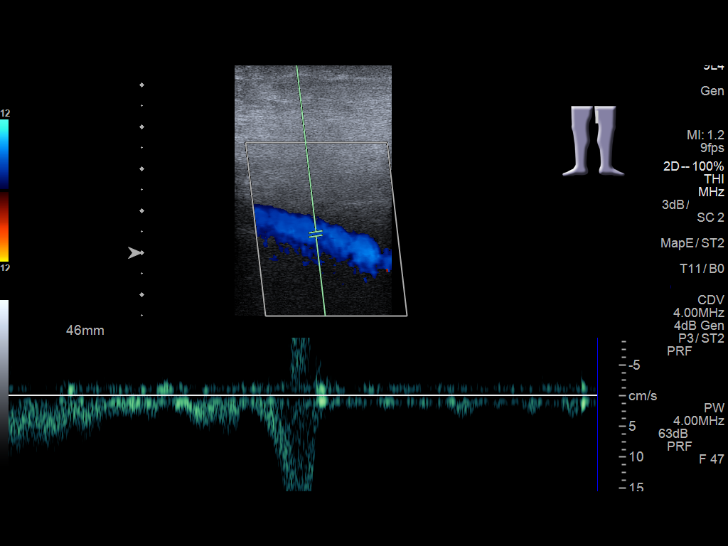
[im 52/57]
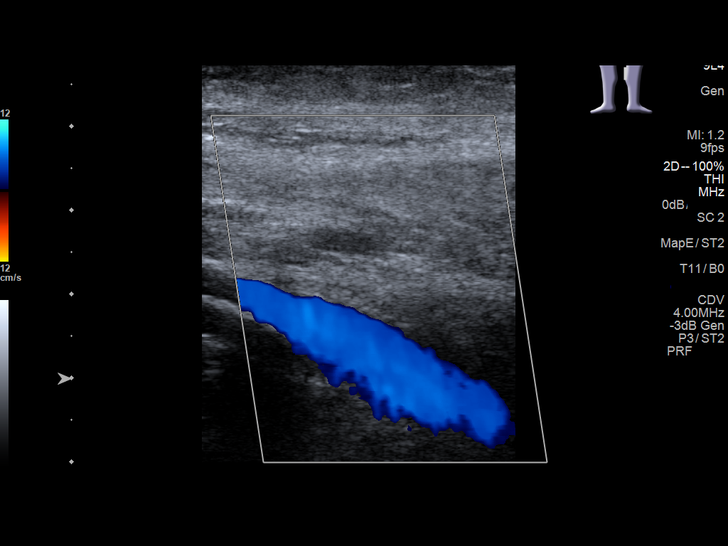
[im 57/57]
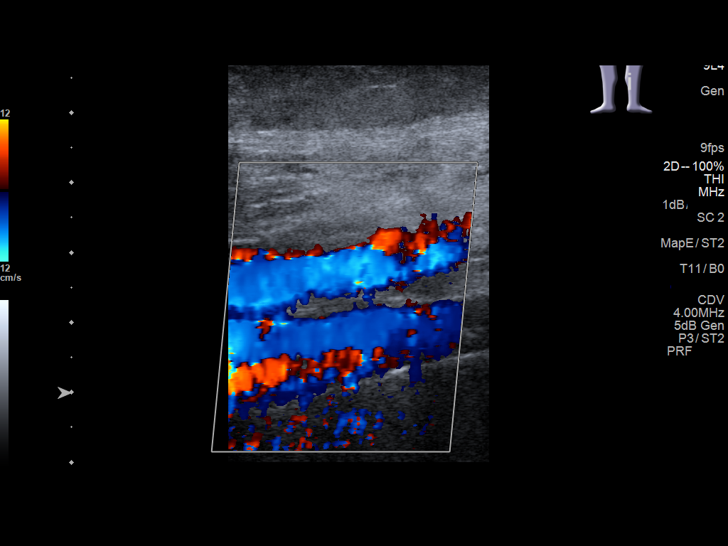

[13 of 24 positions shown; findings below may reference images not displayed]

FINDINGS: RIGHT LOWER EXTREMITY

Common Femoral Vein: No evidence of thrombus. Normal
compressibility, respiratory phasicity and response to augmentation.

Saphenofemoral Junction: No evidence of thrombus. Normal
compressibility and flow on color Doppler imaging.

Profunda Femoral Vein: No evidence of thrombus. Normal
compressibility and flow on color Doppler imaging.

Femoral Vein: No evidence of thrombus. Normal compressibility,
respiratory phasicity and response to augmentation.

Popliteal Vein: No evidence of thrombus. Normal compressibility,
respiratory phasicity and response to augmentation.

Calf Veins: No evidence of thrombus. Normal compressibility and flow
on color Doppler imaging.

Superficial Great Saphenous Vein: No evidence of thrombus. Normal
compressibility.

Venous Reflux:  None.

Other Findings:  None.

LEFT LOWER EXTREMITY

Common Femoral Vein: No evidence of thrombus. Normal
compressibility, respiratory phasicity and response to augmentation.

Saphenofemoral Junction: No evidence of thrombus. Normal
compressibility and flow on color Doppler imaging.

Profunda Femoral Vein: No evidence of thrombus. Normal
compressibility and flow on color Doppler imaging.

Femoral Vein: No evidence of thrombus. Normal compressibility,
respiratory phasicity and response to augmentation.

Popliteal Vein: No evidence of thrombus. Normal compressibility,
respiratory phasicity and response to augmentation.

Calf Veins: No evidence of thrombus. Normal compressibility and flow
on color Doppler imaging.

Superficial Great Saphenous Vein: No evidence of thrombus. Normal
compressibility.

Venous Reflux:  None.

Other Findings:  None.
IMPRESSION: No evidence of deep venous thrombosis.

## 2019-03-29 ENCOUNTER — Inpatient Hospital Stay (HOSPITAL_BASED_OUTPATIENT_CLINIC_OR_DEPARTMENT_OTHER): Payer: Medicare Other | Admitting: Oncology

## 2019-03-29 ENCOUNTER — Inpatient Hospital Stay: Payer: Medicare Other | Attending: Oncology

## 2019-03-29 ENCOUNTER — Encounter: Payer: Self-pay | Admitting: Oncology

## 2019-03-29 ENCOUNTER — Other Ambulatory Visit: Payer: Self-pay

## 2019-03-29 VITALS — BP 159/72 | HR 61 | Temp 97.9°F | Resp 18 | Ht 65.5 in | Wt 194.0 lb

## 2019-03-29 DIAGNOSIS — Z79899 Other long term (current) drug therapy: Secondary | ICD-10-CM | POA: Diagnosis not present

## 2019-03-29 DIAGNOSIS — I5032 Chronic diastolic (congestive) heart failure: Secondary | ICD-10-CM | POA: Diagnosis not present

## 2019-03-29 DIAGNOSIS — J449 Chronic obstructive pulmonary disease, unspecified: Secondary | ICD-10-CM | POA: Insufficient documentation

## 2019-03-29 DIAGNOSIS — K219 Gastro-esophageal reflux disease without esophagitis: Secondary | ICD-10-CM | POA: Diagnosis not present

## 2019-03-29 DIAGNOSIS — E785 Hyperlipidemia, unspecified: Secondary | ICD-10-CM | POA: Diagnosis not present

## 2019-03-29 DIAGNOSIS — Z992 Dependence on renal dialysis: Secondary | ICD-10-CM | POA: Diagnosis not present

## 2019-03-29 DIAGNOSIS — R5381 Other malaise: Secondary | ICD-10-CM | POA: Insufficient documentation

## 2019-03-29 DIAGNOSIS — N186 End stage renal disease: Secondary | ICD-10-CM | POA: Diagnosis not present

## 2019-03-29 DIAGNOSIS — Z7982 Long term (current) use of aspirin: Secondary | ICD-10-CM | POA: Diagnosis not present

## 2019-03-29 DIAGNOSIS — I2510 Atherosclerotic heart disease of native coronary artery without angina pectoris: Secondary | ICD-10-CM

## 2019-03-29 DIAGNOSIS — M1990 Unspecified osteoarthritis, unspecified site: Secondary | ICD-10-CM

## 2019-03-29 DIAGNOSIS — D472 Monoclonal gammopathy: Secondary | ICD-10-CM | POA: Insufficient documentation

## 2019-03-29 DIAGNOSIS — Z87891 Personal history of nicotine dependence: Secondary | ICD-10-CM | POA: Insufficient documentation

## 2019-03-29 DIAGNOSIS — Z794 Long term (current) use of insulin: Secondary | ICD-10-CM | POA: Diagnosis not present

## 2019-03-29 DIAGNOSIS — F1290 Cannabis use, unspecified, uncomplicated: Secondary | ICD-10-CM

## 2019-03-29 DIAGNOSIS — I132 Hypertensive heart and chronic kidney disease with heart failure and with stage 5 chronic kidney disease, or end stage renal disease: Secondary | ICD-10-CM | POA: Diagnosis present

## 2019-03-29 DIAGNOSIS — R5383 Other fatigue: Secondary | ICD-10-CM | POA: Insufficient documentation

## 2019-03-29 DIAGNOSIS — Z791 Long term (current) use of non-steroidal anti-inflammatories (NSAID): Secondary | ICD-10-CM | POA: Diagnosis not present

## 2019-03-29 DIAGNOSIS — E1122 Type 2 diabetes mellitus with diabetic chronic kidney disease: Secondary | ICD-10-CM | POA: Insufficient documentation

## 2019-03-29 DIAGNOSIS — D631 Anemia in chronic kidney disease: Secondary | ICD-10-CM

## 2019-03-29 LAB — CBC WITH DIFFERENTIAL/PLATELET
Abs Immature Granulocytes: 0.01 10*3/uL (ref 0.00–0.07)
Basophils Absolute: 0 10*3/uL (ref 0.0–0.1)
Basophils Relative: 1 %
Eosinophils Absolute: 0.4 10*3/uL (ref 0.0–0.5)
Eosinophils Relative: 8 %
HCT: 33.6 % — ABNORMAL LOW (ref 36.0–46.0)
Hemoglobin: 10.9 g/dL — ABNORMAL LOW (ref 12.0–15.0)
Immature Granulocytes: 0 %
Lymphocytes Relative: 39 %
Lymphs Abs: 2.1 10*3/uL (ref 0.7–4.0)
MCH: 30.2 pg (ref 26.0–34.0)
MCHC: 32.4 g/dL (ref 30.0–36.0)
MCV: 93.1 fL (ref 80.0–100.0)
Monocytes Absolute: 0.8 10*3/uL (ref 0.1–1.0)
Monocytes Relative: 15 %
Neutro Abs: 2 10*3/uL (ref 1.7–7.7)
Neutrophils Relative %: 37 %
Platelets: 197 10*3/uL (ref 150–400)
RBC: 3.61 MIL/uL — ABNORMAL LOW (ref 3.87–5.11)
RDW: 15.4 % (ref 11.5–15.5)
WBC: 5.4 10*3/uL (ref 4.0–10.5)
nRBC: 0 % (ref 0.0–0.2)

## 2019-03-29 NOTE — Progress Notes (Signed)
Hematology/Oncology Consult note [facility]  [phone] [phone]  Patient Care Team: Freddy Finner, NP as PCP - General (Nurse Practitioner) Lavonia Dana, MD as Consulting Physician (Internal Medicine)   Name of the patient: [name]  [id number]  09-12-1961   Date of visit: 03/29/19  Diagnosis-anemia of chronic kidney disease MGUS Chief complaint/ Reason for visit-routine follow-up of anemia of chronic kidney disease  Heme/Onc history: patient is a 58 year old female with a past medical history significant for coronary artery disease, type 2 diabetes with neuropathy, hypertension hyperlipidemia and osteopenia arthritis. She has ESRD secondary to HTN/DM and has been on dialysis for about 14 years. She was seen by rheumatology for complaints of low back pain. She also had painful subcutaneous skin rashes and was seen by dermatology and biopsy of one of the lesions showed mild septal panicky like this. As a part of workup by rheumatology doctor Dr. Lezlie Lye- Meda Coffee she had SPEP done which came back abnormal with the presence of M spike and hence has been referred to [place]. There was still some ongoing concern about possible lupus and she follows up with rheumatology. Her last colonoscopy was in November 2017 which showed tubular adenoma and a repeat colonoscopy was recommended in 5 years.  CBC from 01/08/2017 showed white count of 6.7, H&H of 13.9/42.8 and a platelet count of 161. CMP showed elevated BUN of 43 and creatinine of 5.22. Calcium was normal at 9.7. Total protein was mildly elevated at 8.6. An albumin was normal at 3.9. SPEP from 11/06/2016 did not reveal any M spike. M spike of 4.2% was noted on 24-hour urine protein.A lambda free light chain ratio was normal although both And lambda were elevated due to chronic kidney disease.she gets epo through dialysis. She also has kidney mass suspicious for RCC being followed by Dr. Erlene Quan   Interval history-overall she is doing well and other than chronic fatigue she denies any new complaints at this time.  She has been getting Procrit through her dialysis. ECOG PS- 1 Pain scale- 0 Opioid associated constipation- no  Review of systems- Review of Systems  Constitutional: Positive for malaise/fatigue. Negative for chills, fever and weight loss.  HENT: Negative for congestion, ear discharge and nosebleeds.   Eyes: Negative for blurred vision.  Respiratory: Negative for cough, hemoptysis, sputum production, shortness of breath and wheezing.   Cardiovascular: Negative for chest pain, palpitations, orthopnea and claudication.  Gastrointestinal: Negative for abdominal pain, blood in stool, constipation, diarrhea, heartburn, melena, nausea and vomiting.  Genitourinary: Negative for dysuria, flank pain, frequency, hematuria and urgency.  Musculoskeletal: Negative for back pain, joint pain and myalgias.  Skin: Negative for rash.  Neurological: Negative for dizziness, tingling, focal weakness, seizures, weakness and headaches.  Endo/Heme/Allergies: Does not bruise/bleed easily.  Psychiatric/Behavioral: Negative for depression and suicidal ideas. The patient does not have insomnia.      Allergies  Allergen Reactions  . Ace Inhibitors Swelling and Anaphylaxis  . Ativan [Lorazepam] Other (See Comments)    Reaction:Hallucinations and headaches  . Compazine [Prochlorperazine Edisylate] Anaphylaxis, Nausea And Vomiting and Other (See Comments)    Other reaction(s): dystonia from this vs. Reglan, 23 Jul - patient relates that she takes promethazine frequently with no problems  . Sumatriptan Succinate Other (See Comments)    Other reaction(s): delirium and hallucinations per [facility] records  . Dilaudid [Hydromorphone Hcl] Other (See Comments)    Delirium   . Ondansetron Other (See Comments)    hallucinations    .  Zofran [Ondansetron Hcl] Other (See Comments)    Reaction:   hallucinations   . Codeine Nausea And Vomiting  . Gabapentin Other (See Comments)    Reaction:  Unknown    . Lac Bovis Nausea And Vomiting  . Losartan Nausea Only  . Oxycodone Anxiety  . Prochlorperazine Other (See Comments)    Reaction:  Unknown . Patient does not remember reaction but she does have vertigo and anxiety along with n and v at times. Could be used to treat any of these   . Reglan [Metoclopramide] Other (See Comments)    Per patient her Dr. Evelina Bucy her off it   . Scopolamine Other (See Comments)    Dizziness, also has vertigo already  . Tape Rash  . Tapentadol Rash     Past Medical History:  Diagnosis Date  . Anemia   . Anginal pain ([place])   . Anxiety   . Arthritis   . Asthma   . Broken wrist   . Bronchitis   . chronic diastolic CHF 1/60/1093  . COPD (chronic obstructive pulmonary disease) ([facility])   . Coronary artery disease    a. cath 2013: stenting to RCA (report not available); b. cath 2014: LM nl, pLAD 40%, mLAD nl, ost LCx 40%, mid LCx nl, pRCA 30% @ site of prior stent, mRCA 50%  . Depression   . Diabetes mellitus without complication ([place])   . Diabetic neuropathy ([place])   . dialysis 2006  . Diverticulosis   . Dizziness   . Dyspnea   . Elevated lipids   . Environmental and seasonal allergies   . ESRD (end stage renal disease) on dialysis ([place])    M-W-F  . GERD (gastroesophageal reflux disease)   . Headache   . History of hiatal hernia   . HOH (hard of hearing)   . Hx of pancreatitis 2015  . Hypertension   . Lower extremity edema   . Mitral regurgitation    a. echo 10/2013: EF 62%, noWMA, mildly dilated LA, mild to mod MR/TR, GR1DD  . Myocardial infarction ([place])   . Orthopnea   . Pneumonia   . Renal cancer ([place])   . Renal insufficiency    Pt is on dialysis on M,W + F.  . Wheezing      Past Surgical History:  Procedure Laterality Date  . A/V SHUNTOGRAM Left 01/20/2018   Procedure: A/V SHUNTOGRAM;  Surgeon: Algernon Huxley, MD;  Location: [place] [facility];  Service: Cardiovascular;  Laterality: Left;  . ABDOMINAL HYSTERECTOMY  1992  . AMPUTATION TOE Left 10/02/2017   Procedure: AMPUTATION TOE-LEFT GREAT TOE;  Surgeon: Albertine Patricia, DPM;  Location: [facility] ORS;  Service: Podiatry;  Laterality: Left;  . APPENDECTOMY    . ARTERY BIOPSY Right 10/11/2018   Procedure: BIOPSY TEMPORAL ARTERY;  Surgeon: Vickie Epley, MD;  Location: [facility] ORS;  Service: General;  Laterality: Right;  . CARDIAC CATHETERIZATION Left 07/26/2015   Procedure: Left Heart Cath and Coronary Angiography;  Surgeon: Dionisio David, MD;  Location: [place] CV [facility];  Service: Cardiovascular;  Laterality: Left;  . CATARACT EXTRACTION W/ INTRAOCULAR LENS IMPLANT Right   . CATARACT EXTRACTION W/PHACO Left 03/10/2017   Procedure: CATARACT EXTRACTION PHACO AND INTRAOCULAR LENS PLACEMENT (IOC);  Surgeon: Birder Robson, MD;  Location: [facility] ORS;  Service: Ophthalmology;  Laterality: Left;  [place] 00:51.9 AP% 14.2 CDE 7.39 fluid pack lot # [id number]  . CHOLECYSTECTOMY    . COLONOSCOPY WITH PROPOFOL N/A 08/12/2016  Procedure: COLONOSCOPY WITH PROPOFOL;  Surgeon: Lollie Sails, MD;  Location: [facility] ENDOSCOPY;  Service: Endoscopy;  Laterality: N/A;  . DIALYSIS FISTULA CREATION Left    upper arm  . dialysis grafts    . ESOPHAGOGASTRODUODENOSCOPY N/A 03/08/2015   Procedure: ESOPHAGOGASTRODUODENOSCOPY (EGD);  Surgeon: Manya Silvas, MD;  Location: [facility] ENDOSCOPY;  Service: Endoscopy;  Laterality: N/A;  . ESOPHAGOGASTRODUODENOSCOPY (EGD) WITH PROPOFOL N/A 03/18/2016   Procedure: ESOPHAGOGASTRODUODENOSCOPY (EGD) WITH PROPOFOL;  Surgeon: [name], MD;  Location: [facility] ENDOSCOPY;  Service: Endoscopy;  Laterality: N/A;  . EYE SURGERY Right 2018  . FECAL TRANSPLANT N/A 08/23/2015   Procedure: FECAL TRANSPLANT;  Surgeon: Manya Silvas, MD;  [facility];  Service: Endoscopy;  Laterality: N/A;  . HAND SURGERY Bilateral   . PERIPHERAL VASCULAR CATHETERIZATION  N/A 12/20/2015   Procedure: Thrombectomy of dialysis access versus permcath placement;  Surgeon: Algernon Huxley, MD;  Location: [place] [facility];  Service: Cardiovascular;  Laterality: N/A;  . PERIPHERAL VASCULAR CATHETERIZATION N/A 12/20/2015   Procedure: A/V Shunt Intervention;  Surgeon: Algernon Huxley, MD;  Location: [place] CV LAB;  Service: Cardiovascular;  Laterality: N/A;  . PERIPHERAL VASCULAR CATHETERIZATION N/A 12/20/2015   Procedure: A/V Shuntogram/Fistulagram;  Surgeon: Algernon Huxley, MD;  Location: [place] CV LAB;  Service: Cardiovascular;  Laterality: N/A;  . PERIPHERAL VASCULAR CATHETERIZATION N/A 01/02/2016   Procedure: A/V Shuntogram/Fistulagram;  Surgeon: Algernon Huxley, MD;  Location: [place] CV LAB;  Service: Cardiovascular;  Laterality: N/A;  . PERIPHERAL VASCULAR CATHETERIZATION N/A 01/02/2016   Procedure: A/V Shunt Intervention;  Surgeon: Algernon Huxley, MD;  Location: [place] CV LAB;  Service: Cardiovascular;  Laterality: N/A;    Social History   Socioeconomic History  . Marital status: Divorced    Spouse name: Not on file  . Number of children: Not on file  . Years of education: Not on file  . Highest education level: Not on file  Occupational History  . Not on file  Social Needs  . Financial resource strain: Not hard at all  . Food insecurity    Worry: Never true    Inability: Never true  . Transportation needs    Medical: No    Non-medical: No  Tobacco Use  . Smoking status: Former Smoker    Packs/day: 0.50    Years: 40.00    Pack years: 20.00    Types: Cigarettes    Quit date: 02/13/2015    Years since quitting: 4.1  . Smokeless tobacco: Never Used  Substance and Sexual Activity  . Alcohol use: Not Currently    Comment: glass wine week per pt  . Drug use: Yes    Types: Marijuana    Comment: once a day  . Sexual activity: Never  Lifestyle  . Physical activity    Days per week: 7 days    Minutes per session: 10 min  . Stress: Very  much  Relationships  . Social connections    Talks on phone: More than three times a week    Gets together: More than three times a week    Attends religious service: More than 4 times per year    Active member of club or organization: Yes    Attends meetings of clubs or organizations: Never    Relationship status: Divorced  . Intimate partner violence    Fear of current or ex partner: No    Emotionally abused: No    Physically abused: No  Forced sexual activity: No  Other Topics Concern  . Not on file  Social History Narrative  . Not on file    Family History  Problem Relation Age of Onset  . Kidney disease Mother   . Diabetes Mother   . Cancer Father   . Kidney disease Sister      Current Outpatient Medications:  .  amLODipine (NORVASC) 10 MG tablet, Take 10 mg by mouth daily., Disp: , Rfl:  .  aspirin EC 81 MG tablet, Take 81 mg by mouth daily., Disp: , Rfl:  .  atorvastatin (LIPITOR) 20 MG tablet, Take 20 mg by mouth every evening. , Disp: , Rfl:  .  Biotin 1 MG CAPS, Take 1 mg by mouth daily., Disp: , Rfl:  .  carvedilol (COREG) 3.125 MG tablet, Take 1 tablet (3.125 mg total) by mouth 2 (two) times daily with a meal., Disp: 30 tablet, Rfl: 0 .  cetirizine (ZYRTEC) 10 MG tablet, Take 10 mg by mouth daily. , Disp: , Rfl:  .  cholecalciferol (VITAMIN D) 400 units TABS tablet, Take 400 Units by mouth daily., Disp: , Rfl:  .  cyanocobalamin 1000 MCG tablet, Take 1,000 mcg by mouth daily. , Disp: , Rfl:  .  cyclobenzaprine (FLEXERIL) 10 MG tablet, Take 1 tablet (10 mg total) by mouth 3 (three) times daily as needed., Disp: 15 tablet, Rfl: 0 .  ferrous sulfate ([employer] IRON) 325 (65 FE) MG tablet, Take 325 mg by mouth daily with breakfast., Disp: , Rfl:  .  FLUoxetine (PROZAC) 20 MG capsule, Take 1 capsule (20 mg total) by mouth daily. (Patient taking differently: Take 20 mg by mouth at bedtime. ), Disp: 30 capsule, Rfl: 0 .  hydrocortisone cream 0.5 %, Apply topically 3 (three)  times daily., Disp: 30 g, Rfl: 0 .  insulin aspart (NOVOLOG FLEXPEN) 100 UNIT/ML FlexPen, Inject 4-15 Units into the skin 4 (four) times daily as needed for high blood sugar (> 100, per sliding scale)., Disp: , Rfl:  .  lipase/protease/amylase (CREON) 12000 UNITS CPEP capsule, Take 36000 mg in the morning, 24000 midday & 36000 in the evening, Disp: , Rfl:  .  LORazepam (ATIVAN PO), Take by mouth., Disp: , Rfl:  .  multivitamin (RENA-VIT) TABS tablet, Take 1 tablet by mouth daily. , Disp: , Rfl:  .  predniSONE (DELTASONE) 10 MG tablet, Take 6 tablets (60 mg total) by mouth daily with breakfast. Need to follow up with pcp in 3-5 days regarding biopsy results from temporal artery and decide wether to continue high dose prednsione or not, Disp: 10 tablet, Rfl: 0 .  sevelamer carbonate (RENVELA) 800 MG tablet, Take 800-2,400 mg by mouth See admin instructions. Take 2400 mg by mouth 3 times daily with meals and take 800 mg by mouth with snacks, Disp: , Rfl:  .  albuterol (PROVENTIL HFA;VENTOLIN HFA) 108 (90 Base) MCG/ACT inhaler, Inhale 2 puffs into the lungs every 6 (six) hours as needed for wheezing or shortness of breath. (Patient not taking: Reported on 03/29/2019), Disp: 1 Inhaler, Rfl: 2 .  albuterol (PROVENTIL) (2.5 MG/3ML) 0.083% nebulizer solution, Take 2.5 mg by nebulization every 4 (four) hours as needed for wheezing or shortness of breath., Disp: , Rfl:  .  azithromycin (ZITHROMAX) 250 MG tablet, For 4 days (Patient not taking: Reported on 03/29/2019), Disp: 4 each, Rfl: 0 .  chlorpheniramine-HYDROcodone (TUSSIONEX) 10-8 MG/5ML SUER, Take 5 mLs by mouth every 12 (twelve) hours. (Patient not taking: Reported on 03/29/2019), Disp:  70 mL, Rfl: 0 .  diclofenac sodium (VOLTAREN) 1 % GEL, Apply 1 application topically daily as needed (for rash)., Disp: , Rfl:  .  nitroGLYCERIN (NITROSTAT) 0.4 MG SL tablet, Place 0.4 mg under the tongue every 5 (five) minutes as needed for chest pain. , Disp: , Rfl:  .   ranitidine (ZANTAC) 300 MG capsule, Take 1 capsule by mouth every evening., Disp: , Rfl: 1  Physical exam:  Vitals:   03/29/19 1007  BP: (!) 159/72  Pulse: 61  Resp: 18  Temp: 97.9 F (36.6 C)  TempSrc: Tympanic  SpO2: 98%  Weight: 194 lb (88 kg)  Height: 5' 5.5" (1.664 m)   Physical Exam Constitutional:      General: She is not in acute distress. HENT:     Head: Normocephalic and atraumatic.  Eyes:     Pupils: Pupils are equal, round, and reactive to light.  Neck:     Musculoskeletal: Normal range of motion.  Cardiovascular:     Rate and Rhythm: Normal rate and regular rhythm.     Heart sounds: Normal heart sounds.  Pulmonary:     Effort: Pulmonary effort is normal.     Breath sounds: Normal breath sounds.  Abdominal:     General: Bowel sounds are normal.     Palpations: Abdomen is soft.  Musculoskeletal:     Comments: Trace bilateral leg edema  Skin:    General: Skin is warm and dry.  Neurological:     Mental Status: She is alert and oriented to person, place, and time.      CMP Latest Ref Rng & Units 10/11/2018  Glucose 70 - 99 mg/dL 218(H)  BUN 6 - 20 mg/dL 72(H)  Creatinine 0.44 - 1.00 mg/dL 7.48(H)  Sodium 135 - 145 mmol/L 128(L)  Potassium 3.5 - 5.1 mmol/L 4.6  Chloride 98 - 111 mmol/L 92(L)  CO2 22 - 32 mmol/L 25  Calcium 8.9 - 10.3 mg/dL 8.1(L)  Total Protein 6.5 - 8.1 g/dL -  Total Bilirubin 0.3 - 1.2 mg/dL -  Alkaline Phos 38 - 126 U/L -  AST 15 - 41 U/L -  ALT 0 - 44 U/L -   CBC Latest Ref Rng & Units 03/29/2019  WBC 4.0 - 10.5 K/uL 5.4  Hemoglobin 12.0 - 15.0 g/dL 10.9(L)  Hematocrit 36.0 - 46.0 % 33.6(L)  Platelets 150 - 400 K/uL 197      Assessment and plan- Patient is a 58 y.o. female with anemia of chronic kidney disease here for routine follow-up  1.  Hemoglobins remained stable around 11.  She has been getting Procrit through dialysis.  I will continue to monitor her anemia.  Repeat CBC with differential ferritin and iron studies in  3 months and I will see her back in 6 months.  She also has a history of MGUS I will be repeating myeloma panel and serum free light chains in 6 months   Visit Diagnosis 1. Anemia of chronic kidney failure, stage 5 ([place])   2. MGUS (monoclonal gammopathy of unknown significance)      Dr. Randa Evens, MD, MPH [facility] [phone] 03/29/2019 2:14 PM

## 2019-03-30 IMAGING — CR DG KNEE COMPLETE 4+V*R*
4 series · 4 of 4 positions shown · non-contrast
Comparison: 09/30/2017

CLINICAL DATA: Fall, right knee pain

EXAM:
RIGHT KNEE - COMPLETE 4+ VIEW

[knee ap]
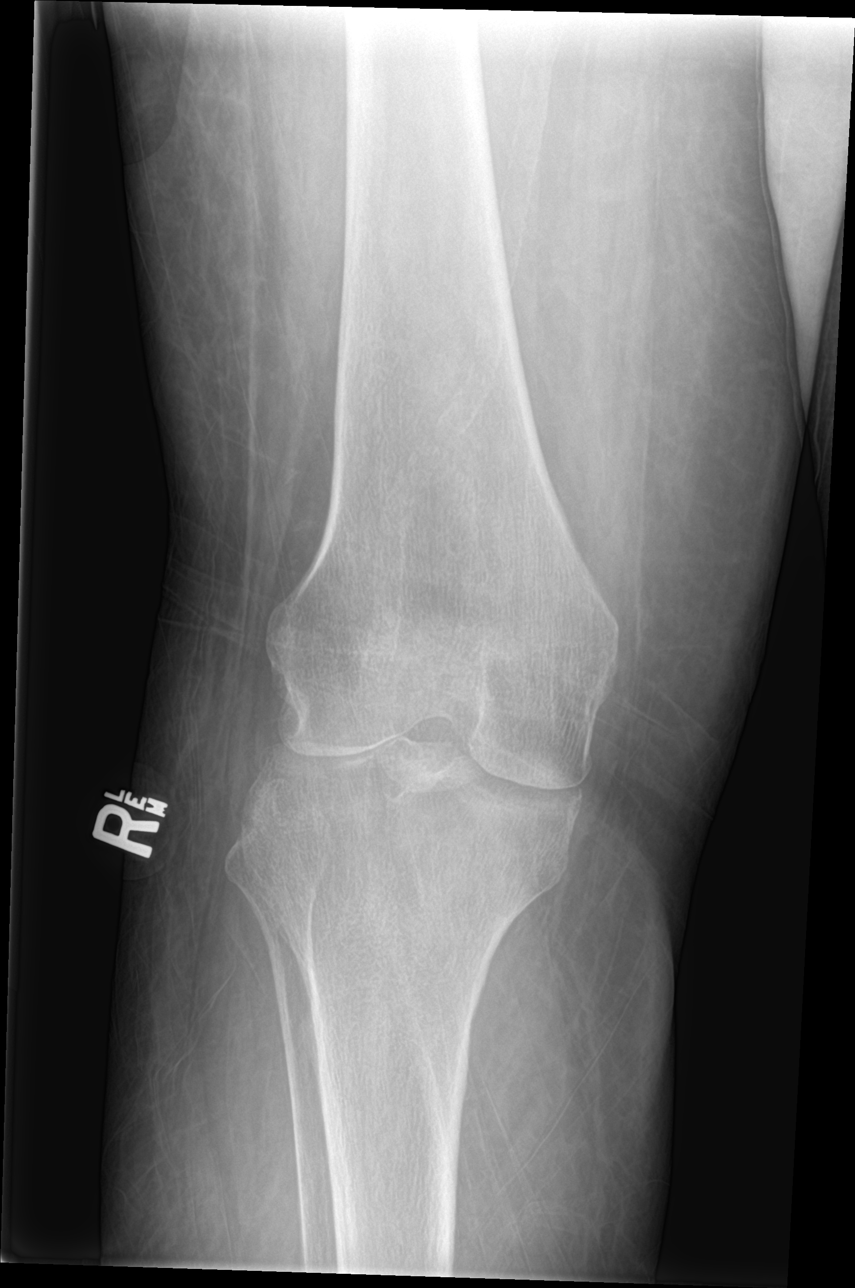

[knee obl (1 of 2)]
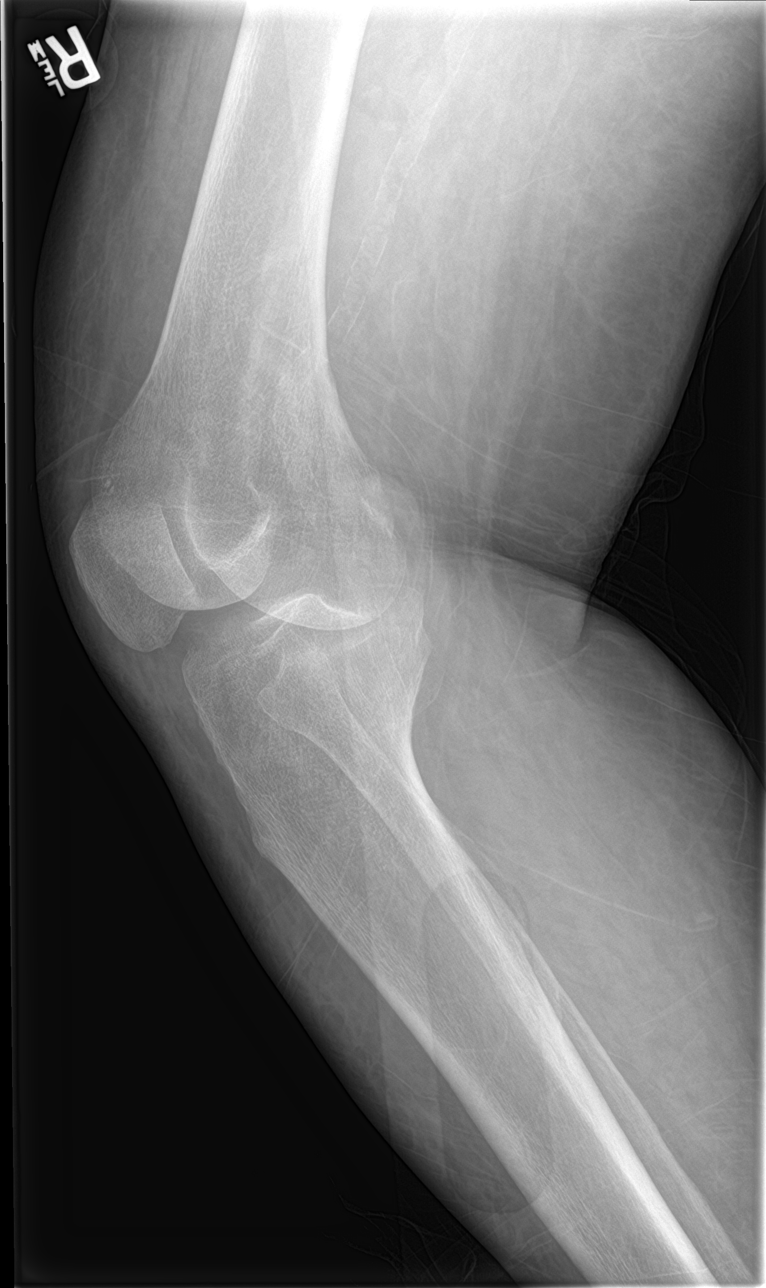

[knee obl (2 of 2)]
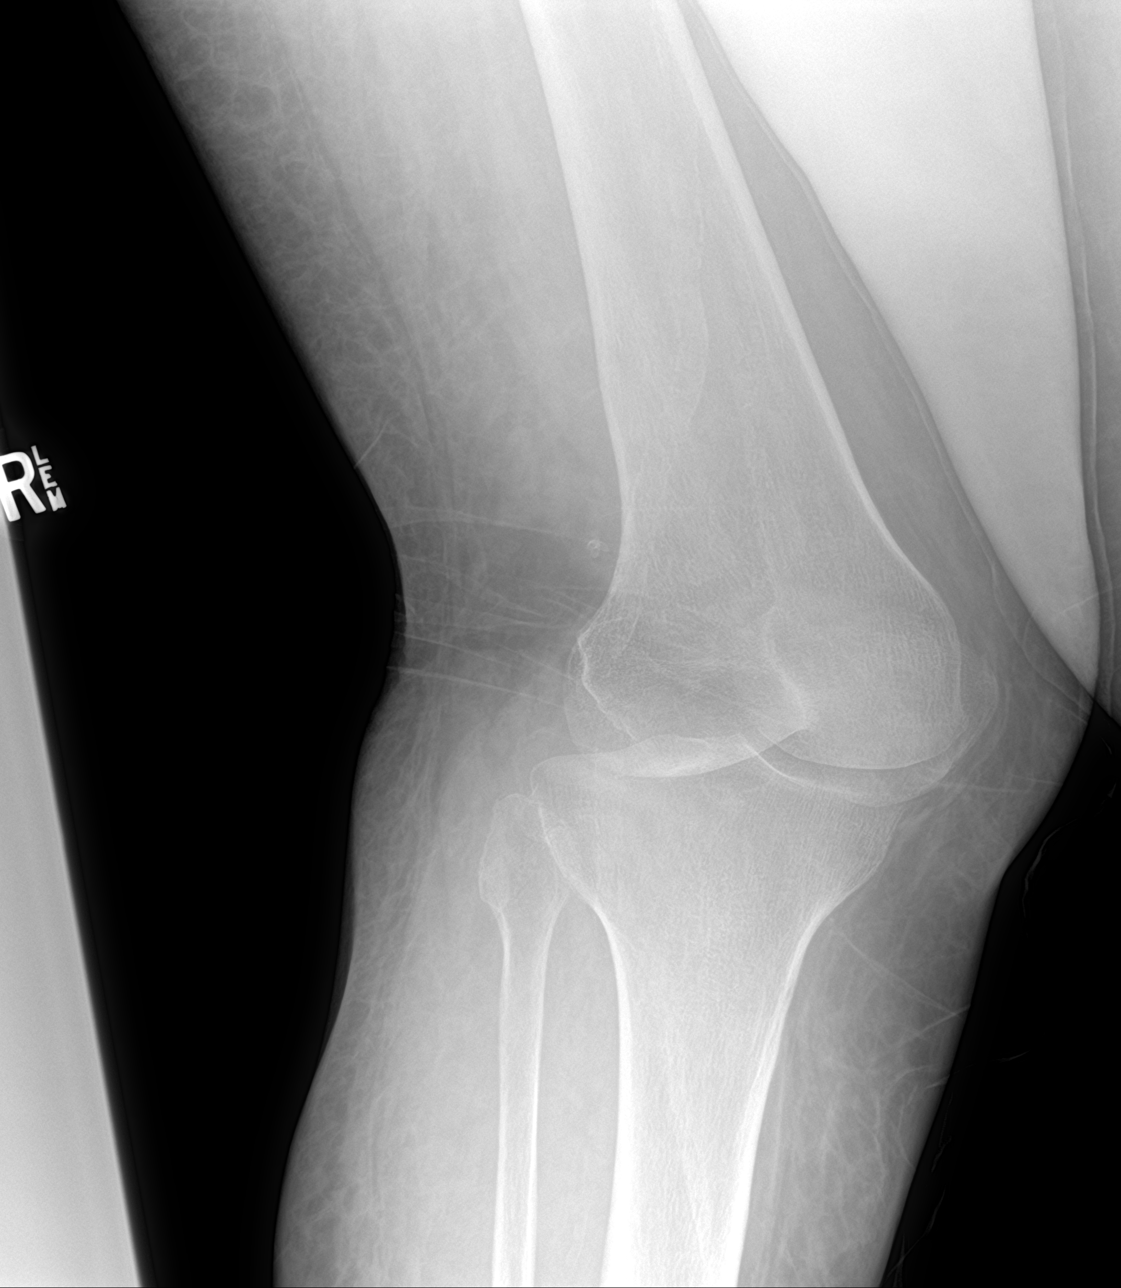

[knee lat]
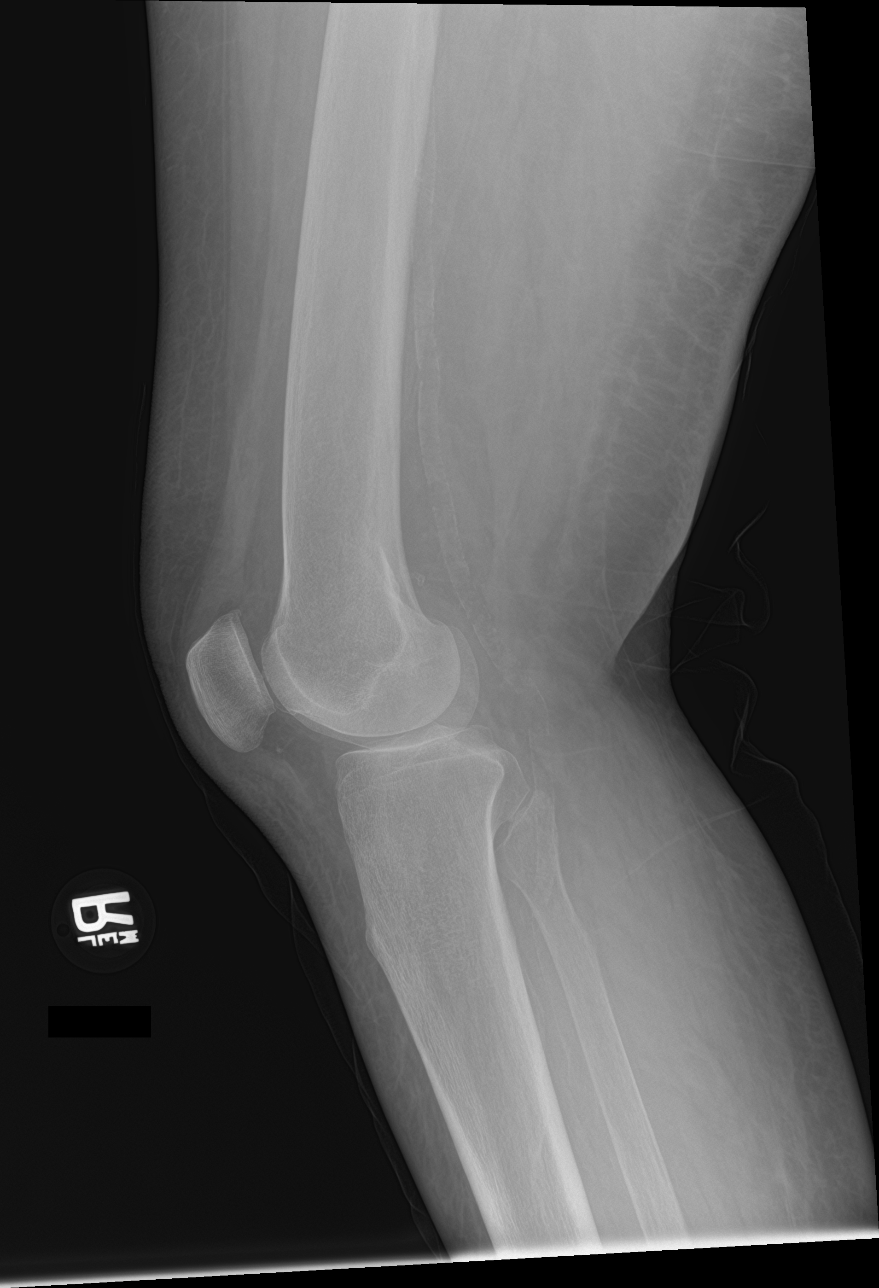

[4 of 4 positions shown; findings below may reference images not displayed]

FINDINGS: Joint space narrowing. No acute bony abnormality. Specifically, no
fracture, subluxation, or dislocation. No joint effusion.
IMPRESSION: No acute bony abnormality.

## 2019-03-30 IMAGING — CR DG CERVICAL SPINE 2 OR 3 VIEWS
4 series · 4 of 4 positions shown · non-contrast
Comparison: None

CLINICAL DATA: Pain post fall

EXAM:
CERVICAL SPINE - 2-3 VIEW

[c-spine lat]
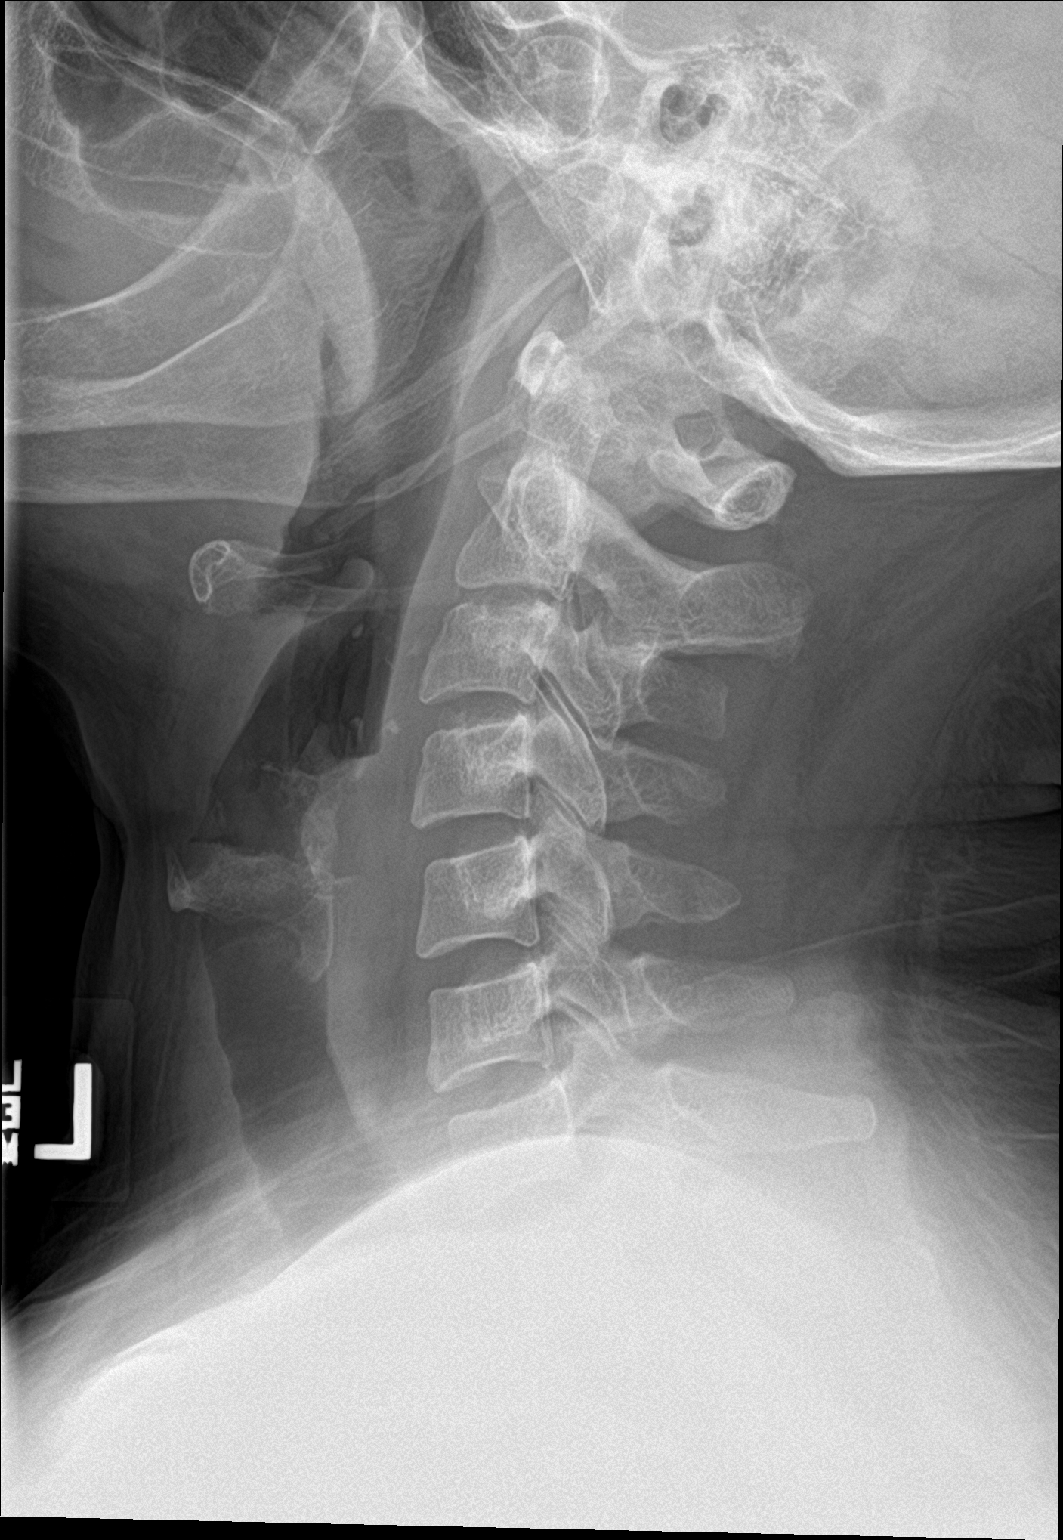

[c-spine ap]
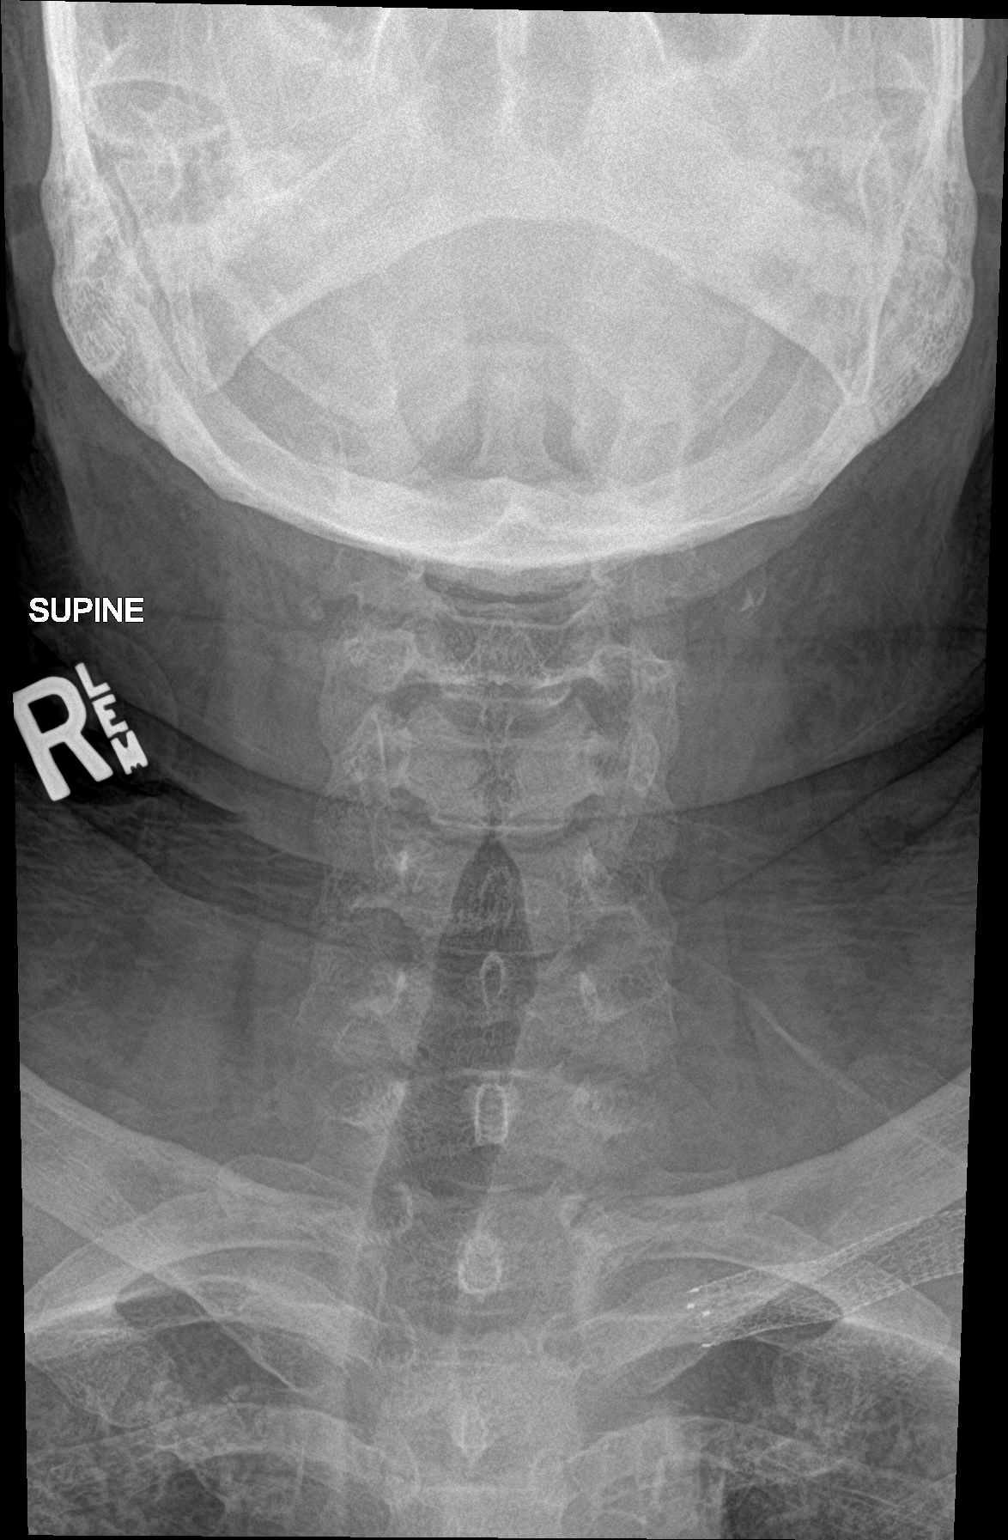

[c-spine open mouth]
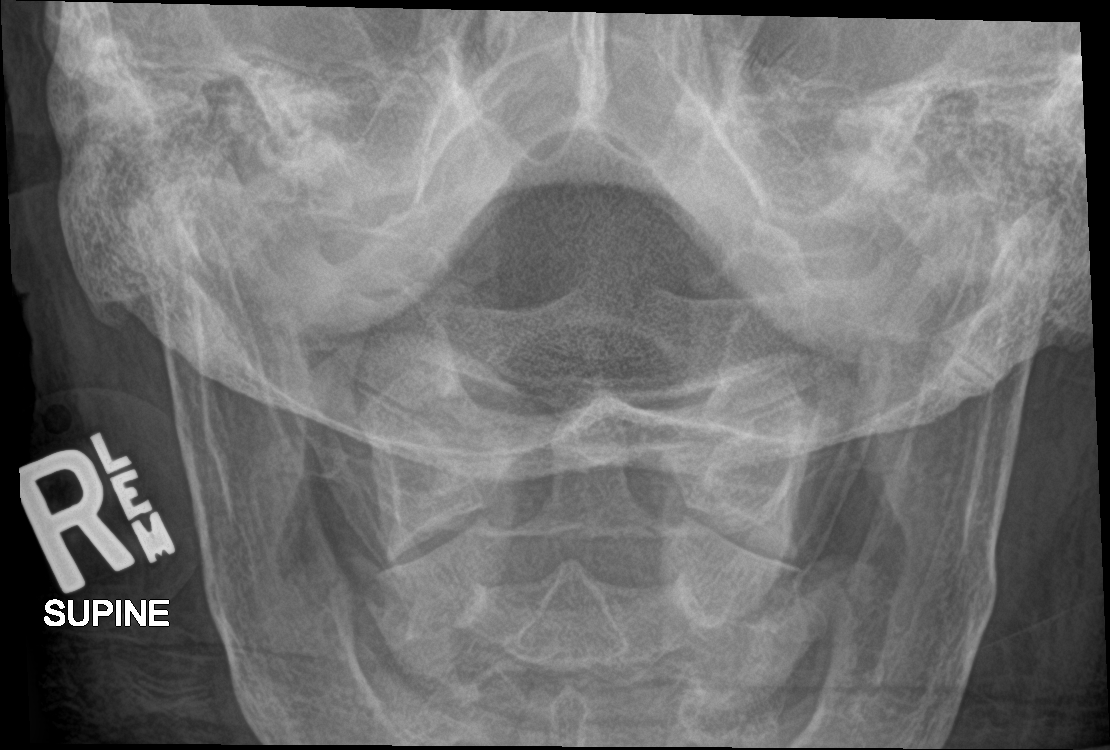

[c-spine swimmers]
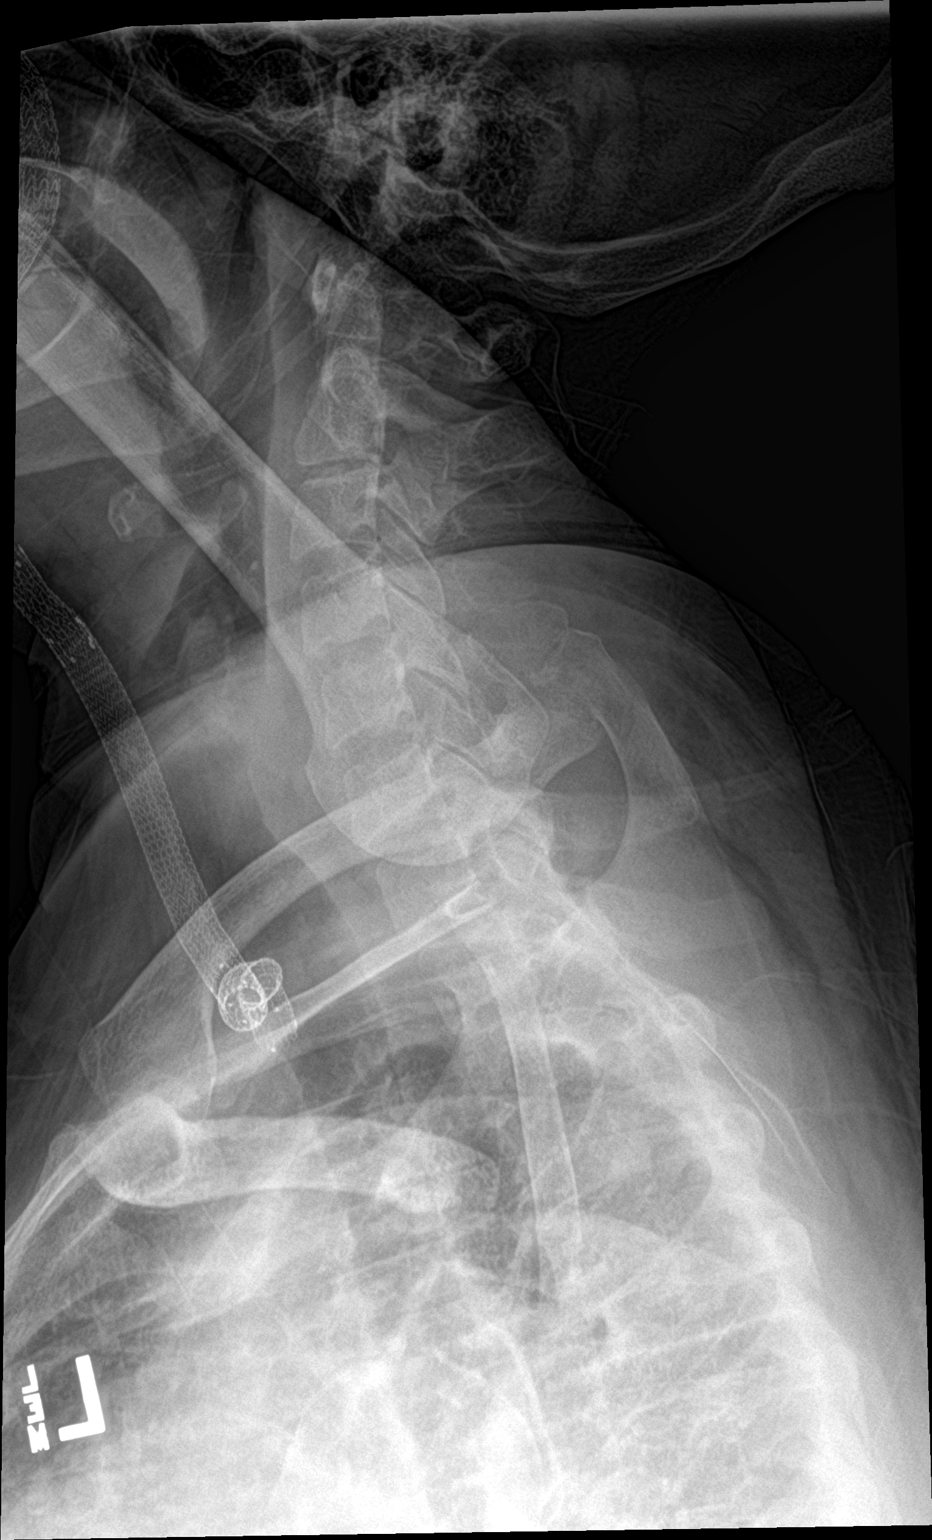

[4 of 4 positions shown; findings below may reference images not displayed]

FINDINGS: Prevertebral soft tissues normal thickness.

Osseous mineralization normal.

Vertebral body and disc space heights maintained.

No fracture, subluxation, or bone destruction.

C1-C2 alignment normal.

Vascular stents identified at the LEFT subclavian and axillary
region.
IMPRESSION: No acute osseous abnormalities.

## 2019-03-30 IMAGING — CR DG HIP (WITH OR WITHOUT PELVIS) 2-3V*R*
3 series · 3 of 3 positions shown · non-contrast
Comparison: None.

CLINICAL DATA: Right hip pain, fall

EXAM:
DG HIP (WITH OR WITHOUT PELVIS) 2-3V RIGHT

[pelvis ap]
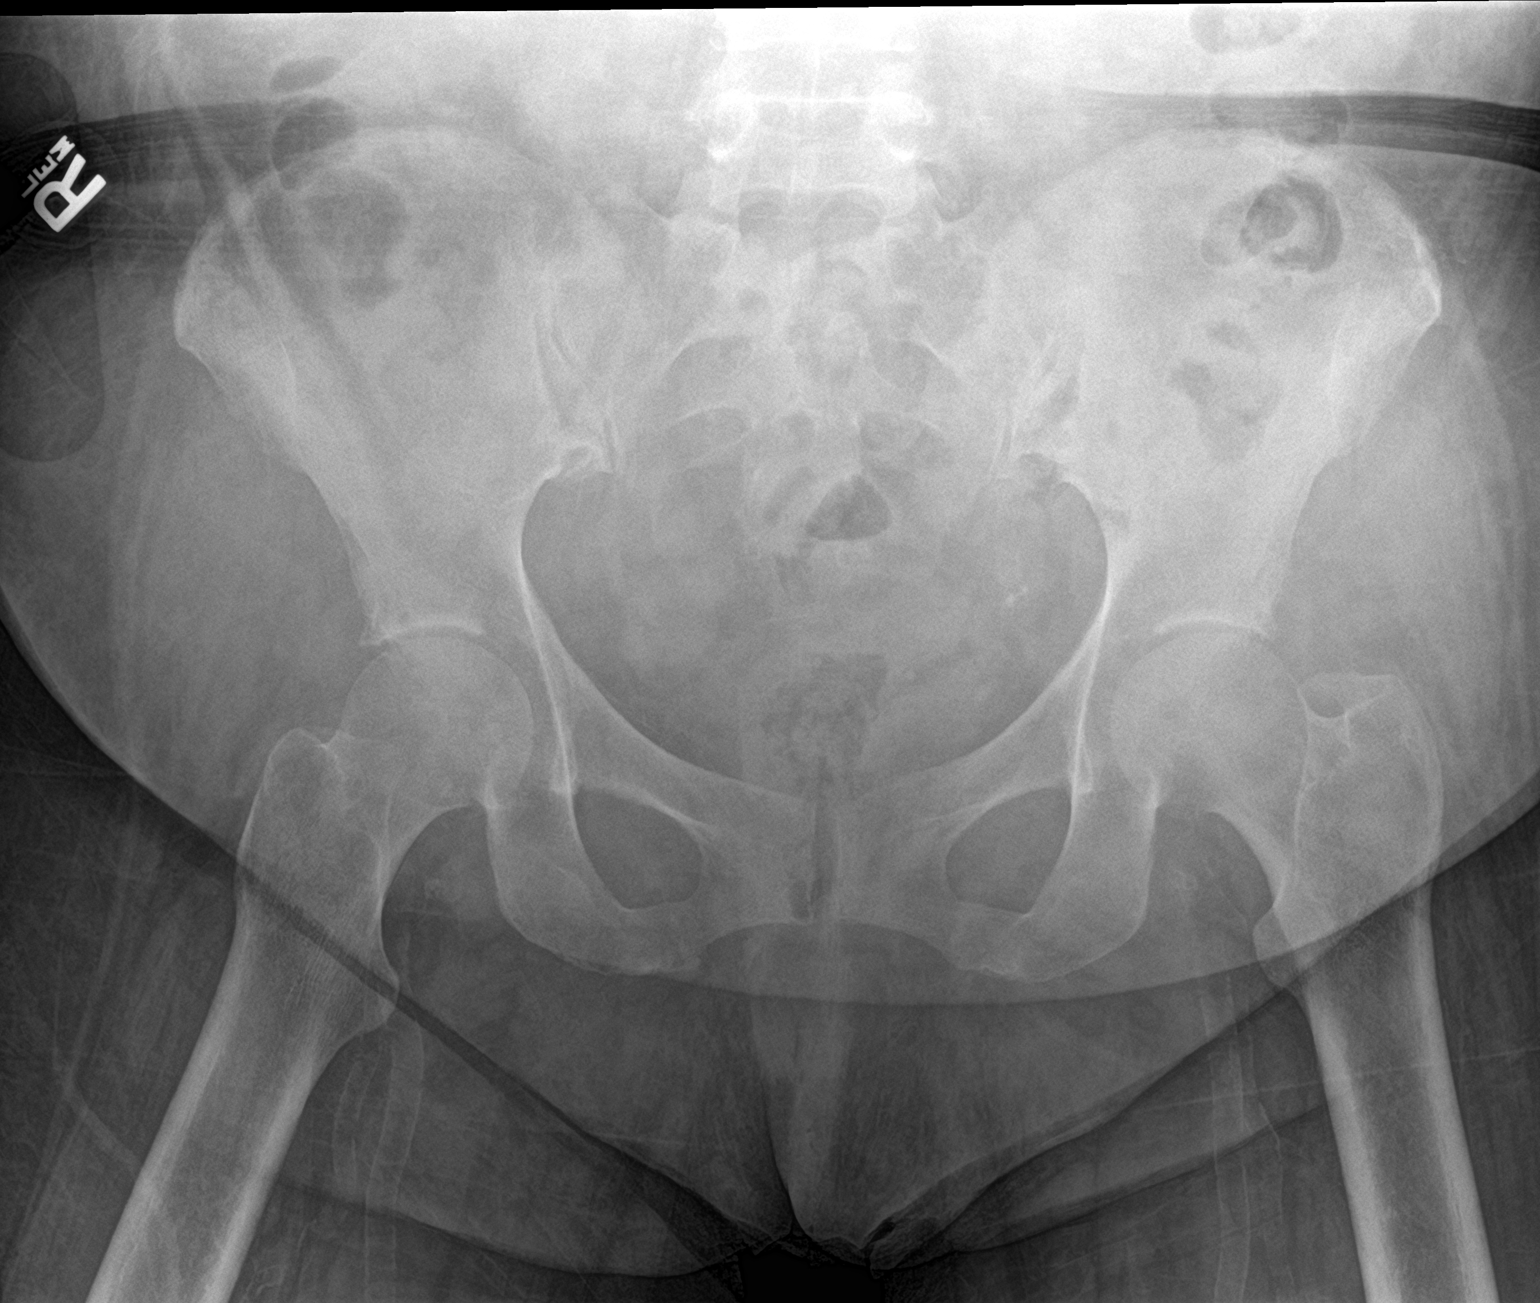

[hip ap]
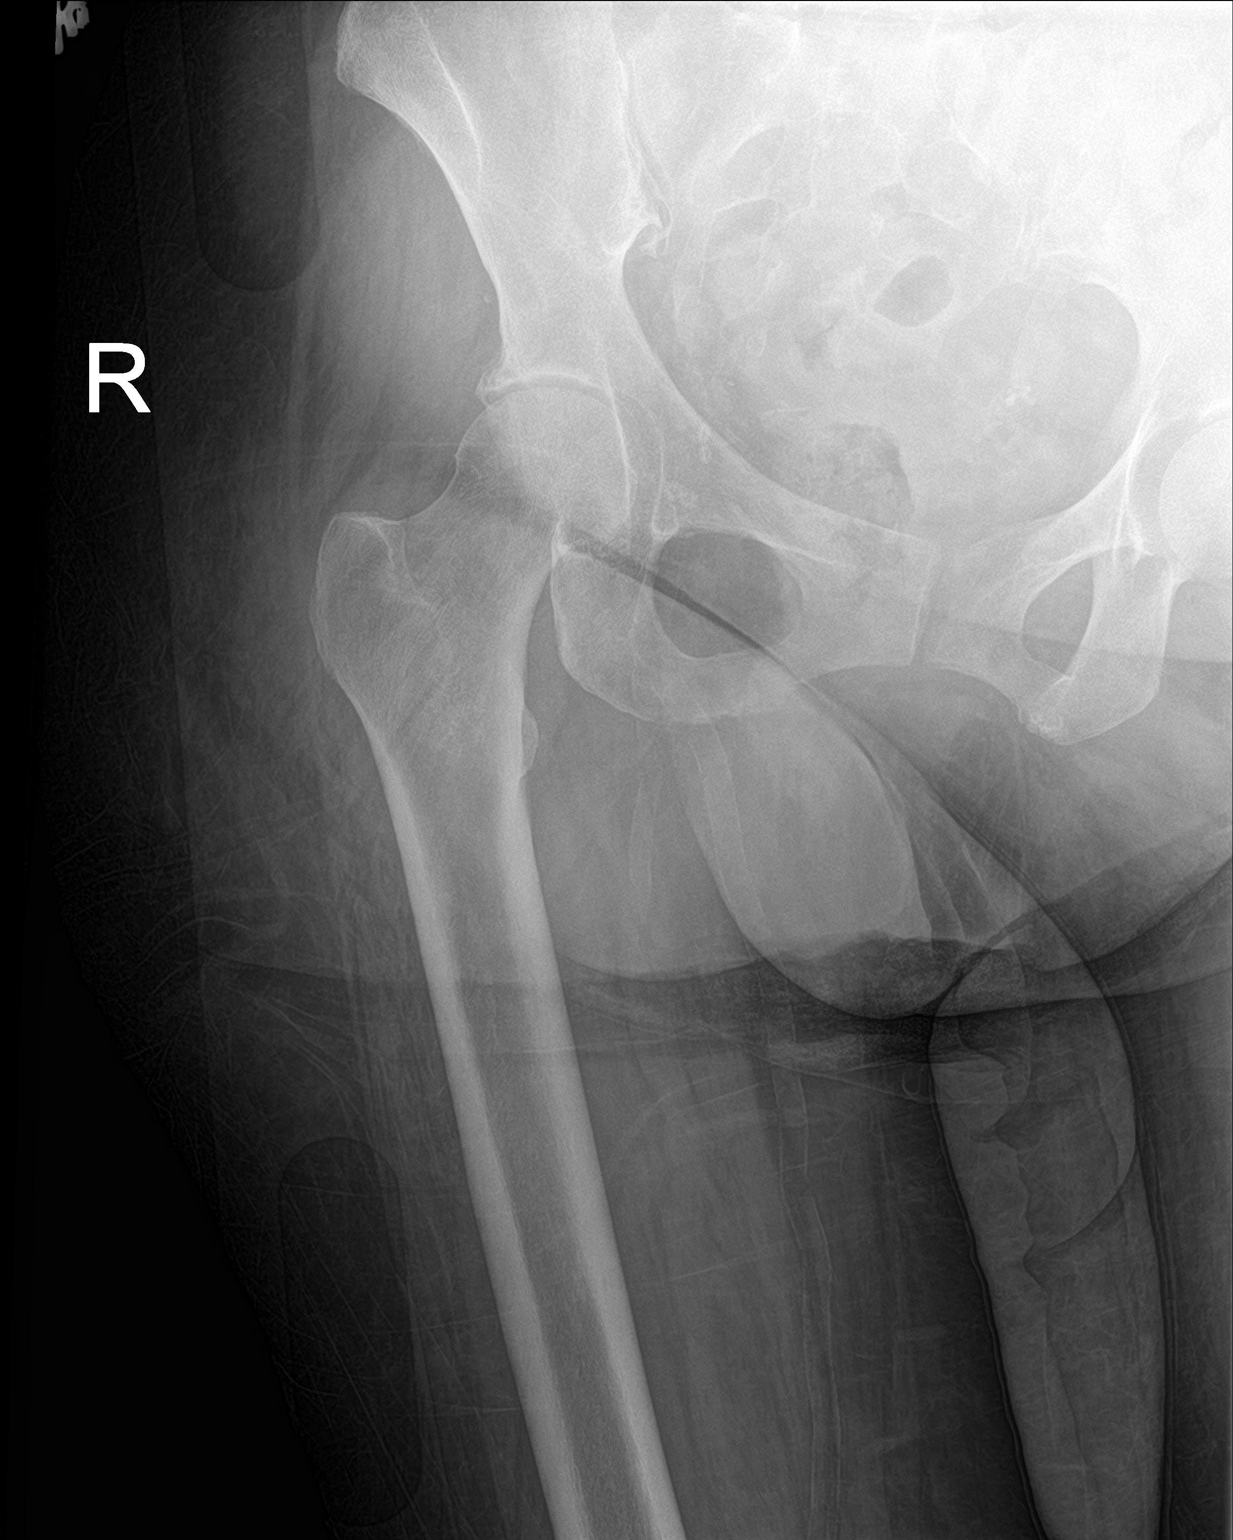

[hip lat]
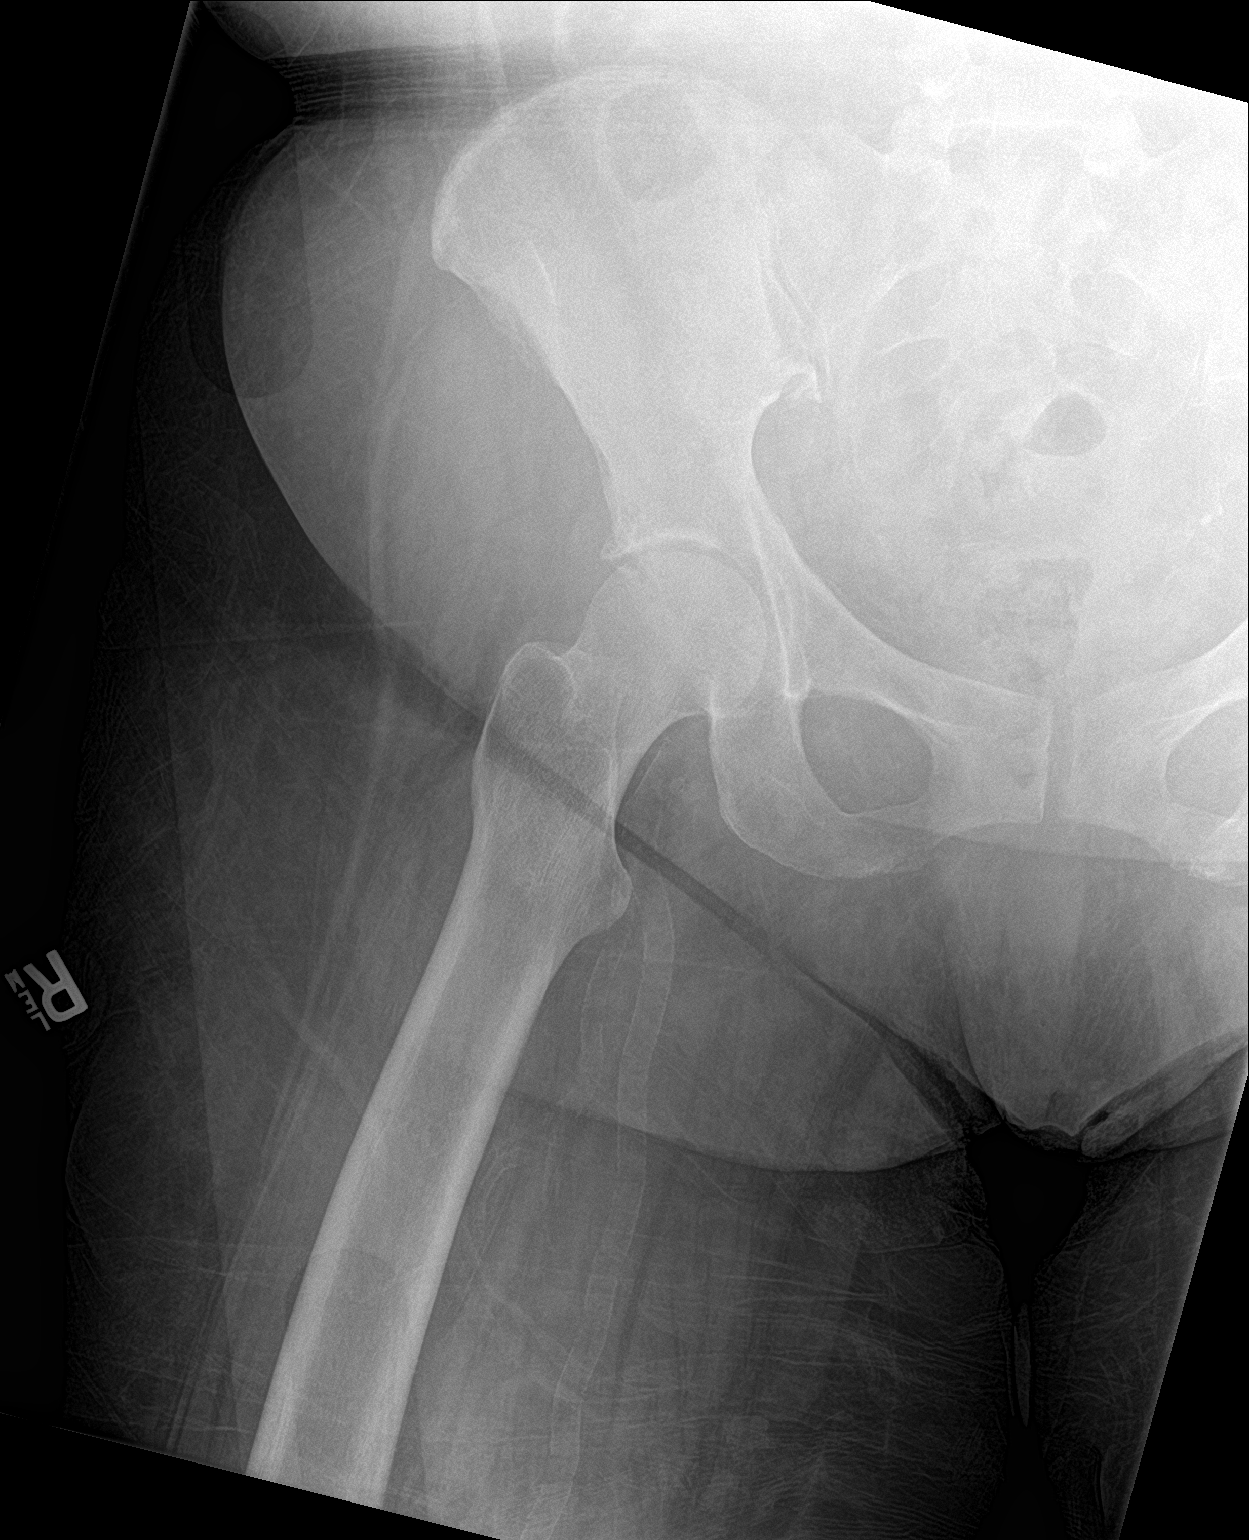

[3 of 3 positions shown; findings below may reference images not displayed]

FINDINGS: Mild joint space narrowing and early spurring in the hip joints
bilaterally. SI joints symmetric and unremarkable. No acute bony
abnormality. Specifically, no fracture, subluxation, or dislocation.
IMPRESSION: Early osteoarthritis.  No acute bony abnormality.

## 2019-03-31 ENCOUNTER — Other Ambulatory Visit: Payer: Self-pay

## 2019-03-31 ENCOUNTER — Encounter (INDEPENDENT_AMBULATORY_CARE_PROVIDER_SITE_OTHER): Payer: Self-pay | Admitting: Nurse Practitioner

## 2019-03-31 ENCOUNTER — Ambulatory Visit (INDEPENDENT_AMBULATORY_CARE_PROVIDER_SITE_OTHER): Payer: Medicare Other | Admitting: Nurse Practitioner

## 2019-03-31 ENCOUNTER — Ambulatory Visit (INDEPENDENT_AMBULATORY_CARE_PROVIDER_SITE_OTHER): Payer: Medicare Other

## 2019-03-31 VITALS — BP 175/75 | HR 68 | Resp 12 | Ht 65.5 in | Wt 191.0 lb

## 2019-03-31 DIAGNOSIS — N186 End stage renal disease: Secondary | ICD-10-CM

## 2019-03-31 DIAGNOSIS — Z9889 Other specified postprocedural states: Secondary | ICD-10-CM | POA: Diagnosis not present

## 2019-03-31 DIAGNOSIS — M79604 Pain in right leg: Secondary | ICD-10-CM | POA: Diagnosis not present

## 2019-03-31 DIAGNOSIS — E1159 Type 2 diabetes mellitus with other circulatory complications: Secondary | ICD-10-CM | POA: Diagnosis not present

## 2019-03-31 DIAGNOSIS — Z992 Dependence on renal dialysis: Secondary | ICD-10-CM

## 2019-03-31 DIAGNOSIS — T829XXS Unspecified complication of cardiac and vascular prosthetic device, implant and graft, sequela: Secondary | ICD-10-CM

## 2019-03-31 DIAGNOSIS — M79605 Pain in left leg: Secondary | ICD-10-CM

## 2019-03-31 DIAGNOSIS — I6523 Occlusion and stenosis of bilateral carotid arteries: Secondary | ICD-10-CM | POA: Diagnosis not present

## 2019-04-05 ENCOUNTER — Encounter (INDEPENDENT_AMBULATORY_CARE_PROVIDER_SITE_OTHER): Payer: Self-pay | Admitting: Nurse Practitioner

## 2019-04-05 NOTE — Progress Notes (Signed)
SUBJECTIVE:  Patient ID: [name], female    DOB: 02/20/1961, 58 y.o.   MRN: [id number] Chief Complaint  Patient presents with  . Follow-up    HPI  [name] is a 58 y.o. female The patient returns to the office for followup status post intervention of the dialysis access left brachial axillary graft. Following the intervention the access function has significantly improved, with better flow rates and improved KT/V. The patient has not been experiencing increased bleeding times following decannulation and the patient denies increased recirculation. The patient denies an increase in arm swelling. At the present time the patient denies hand pain.  The patient denies amaurosis fugax or recent TIA symptoms. There are no recent neurological changes noted. The patient denies claudication symptoms or rest pain symptoms. The patient denies history of DVT, PE or superficial thrombophlebitis. The patient denies recent episodes of angina or shortness of breath.   The patient has a flow volume of 1342 with a patent Dril throughout.  There is some mild narrowing in the mid graft with no significant velocities increased.   The patient is seen for evaluation of painful lower extremities. Patient notes the pain is variable and not always associated with activity.  The pain is somewhat consistent day to day occurring on most days. The patient notes the pain also occurs with standing and routinely seems worse as the day wears on. The pain has been progressive over the past several years. The patient states these symptoms are causing  a profound negative impact on quality of life and daily activities.  The patient denies rest pain or dangling of an extremity off the side of the bed during the night for relief. No open wounds or sores at this time. No history of DVT or phlebitis. No prior interventions or surgeries.       Past Medical History:  Diagnosis Date  . Anemia   . Anginal pain ([place])   .  Anxiety   . Arthritis   . Asthma   . Broken wrist   . Bronchitis   . chronic diastolic CHF 6/64/4034  . COPD (chronic obstructive pulmonary disease) ([place])   . Coronary artery disease    a. cath 2013: stenting to RCA (report not available); b. cath 2014: LM nl, pLAD 40%, mLAD nl, ost LCx 40%, mid LCx nl, pRCA 30% @ site of prior stent, mRCA 50%  . Depression   . Diabetes mellitus without complication ([place])   . Diabetic neuropathy ([place])   . dialysis 2006  . Diverticulosis   . Dizziness   . Dyspnea   . Elevated lipids   . Environmental and seasonal allergies   . ESRD (end stage renal disease) on dialysis ([place])    M-W-F  . GERD (gastroesophageal reflux disease)   . Headache   . History of hiatal hernia   . HOH (hard of hearing)   . Hx of pancreatitis 2015  . Hypertension   . Lower extremity edema   . Mitral regurgitation    a. echo 10/2013: EF 62%, noWMA, mildly dilated LA, mild to mod MR/TR, GR1DD  . Myocardial infarction ([place])   . Orthopnea   . Pneumonia   . Renal cancer ([place])   . Renal insufficiency    Pt is on dialysis on M,W + F.  . Wheezing     Past Surgical History:  Procedure Laterality Date  . A/V SHUNTOGRAM Left 01/20/2018   Procedure: A/V SHUNTOGRAM;  Surgeon: Algernon Huxley,  MD;  Location: [facility];  Service: Cardiovascular;  Laterality: Left;  . ABDOMINAL HYSTERECTOMY  1992  . AMPUTATION TOE Left 10/02/2017   Procedure: AMPUTATION TOE-LEFT GREAT TOE;  Surgeon: Albertine Patricia, DPM;  Location: [facility];  Service: Podiatry;  Laterality: Left;  . APPENDECTOMY    . ARTERY BIOPSY Right 10/11/2018   Procedure: BIOPSY TEMPORAL ARTERY;  Surgeon: Vickie Epley, MD;  Location: [facility];  Service: General;  Laterality: Right;  . CARDIAC CATHETERIZATION Left 07/26/2015   Procedure: Left Heart Cath and Coronary Angiography;  Surgeon: Dionisio David, MD;  Location: [facility];  Service: Cardiovascular;  Laterality: Left;  . CATARACT EXTRACTION W/  INTRAOCULAR LENS IMPLANT Right   . CATARACT EXTRACTION W/PHACO Left 03/10/2017   Procedure: CATARACT EXTRACTION PHACO AND INTRAOCULAR LENS PLACEMENT (IOC);  Surgeon: Birder Robson, MD;  Location: [facility];  Service: Ophthalmology;  Laterality: Left;  [place] 00:51.9 AP% 14.2 CDE 7.39 fluid pack lot # [id number]  . CHOLECYSTECTOMY    . COLONOSCOPY WITH PROPOFOL N/A 08/12/2016   Procedure: COLONOSCOPY WITH PROPOFOL;  Surgeon: Lollie Sails, MD;  Location: [facility];  Service: Endoscopy;  Laterality: N/A;  . DIALYSIS FISTULA CREATION Left    upper arm  . dialysis grafts    . ESOPHAGOGASTRODUODENOSCOPY N/A 03/08/2015   Procedure: ESOPHAGOGASTRODUODENOSCOPY (EGD);  Surgeon: Manya Silvas, MD;  Location: [facility] ENDOSCOPY;  Service: Endoscopy;  Laterality: N/A;  . ESOPHAGOGASTRODUODENOSCOPY (EGD) WITH PROPOFOL N/A 03/18/2016   Procedure: ESOPHAGOGASTRODUODENOSCOPY (EGD) WITH PROPOFOL;  Surgeon: [name], MD;  Location: [facility] ENDOSCOPY;  Service: Endoscopy;  Laterality: N/A;  . EYE SURGERY Right 2018  . FECAL TRANSPLANT N/A 08/23/2015   Procedure: FECAL TRANSPLANT;  Surgeon: Manya Silvas, MD;  Location: [facility] ENDOSCOPY;  Service: Endoscopy;  Laterality: N/A;  . HAND SURGERY Bilateral   . PERIPHERAL VASCULAR CATHETERIZATION N/A 12/20/2015   Procedure: Thrombectomy of dialysis access versus permcath placement;  Surgeon: Algernon Huxley, MD;  Location: [place];  Service: Cardiovascular;  Laterality: N/A;  . PERIPHERAL VASCULAR CATHETERIZATION N/A 12/20/2015   Procedure: A/V Shunt Intervention;  Surgeon: Algernon Huxley, MD;  Location: [place] [facility];  Service: Cardiovascular;  Laterality: N/A;  . PERIPHERAL VASCULAR CATHETERIZATION N/A 12/20/2015   Procedure: A/V Shuntogram/Fistulagram;  Surgeon: Algernon Huxley, MD;  Location: [place] [facility];  Service: Cardiovascular;  Laterality: N/A;  . PERIPHERAL VASCULAR CATHETERIZATION N/A 01/02/2016   Procedure: A/V Shuntogram/Fistulagram;   Surgeon: Algernon Huxley, MD;  Location: [place] [facility];  Service: Cardiovascular;  Laterality: N/A;  . PERIPHERAL VASCULAR CATHETERIZATION N/A 01/02/2016   Procedure: A/V Shunt Intervention;  Surgeon: Algernon Huxley, MD;  Location: [place] [facility];  Service: Cardiovascular;  Laterality: N/A;    Social History   Socioeconomic History  . Marital status: Divorced    Spouse name: Not on file  . Number of children: Not on file  . Years of education: Not on file  . Highest education level: Not on file  Occupational History  . Not on file  Social Needs  . Financial resource strain: Not hard at all  . Food insecurity    Worry: Never true    Inability: Never true  . Transportation needs    Medical: No    Non-medical: No  Tobacco Use  . Smoking status: Former Smoker    Packs/day: 0.50    Years: 40.00    Pack years: 20.00    Types: Cigarettes  Quit date: 02/13/2015    Years since quitting: 4.1  . Smokeless tobacco: Never Used  Substance and Sexual Activity  . Alcohol use: Not Currently    Comment: glass wine week per pt  . Drug use: Yes    Types: Marijuana    Comment: once a day  . Sexual activity: Never  Lifestyle  . Physical activity    Days per week: 7 days    Minutes per session: 10 min  . Stress: Very much  Relationships  . Social connections    Talks on phone: More than three times a week    Gets together: More than three times a week    Attends religious service: More than 4 times per year    Active member of club or organization: Yes    Attends meetings of clubs or organizations: Never    Relationship status: Divorced  . Intimate partner violence    Fear of current or ex partner: No    Emotionally abused: No    Physically abused: No    Forced sexual activity: No  Other Topics Concern  . Not on file  Social History Narrative  . Not on file    Family History  Problem Relation Age of Onset  . Kidney disease Mother   . Diabetes Mother   . Cancer Father    . Kidney disease Sister     Allergies  Allergen Reactions  . Ace Inhibitors Swelling and Anaphylaxis  . Ativan [Lorazepam] Other (See Comments)    Reaction:Hallucinations and headaches  . Compazine [Prochlorperazine Edisylate] Anaphylaxis, Nausea And Vomiting and Other (See Comments)    Other reaction(s): dystonia from this vs. Reglan, 23 Jul - patient relates that she takes promethazine frequently with no problems  . Sumatriptan Succinate Other (See Comments)    Other reaction(s): delirium and hallucinations per [facility] records  . Dilaudid [Hydromorphone Hcl] Other (See Comments)    Delirium   . Ondansetron Other (See Comments)    hallucinations    . Zofran [Ondansetron Hcl] Other (See Comments)    Reaction:  hallucinations   . Codeine Nausea And Vomiting  . Gabapentin Other (See Comments)    Reaction:  Unknown    . [place] Nausea And Vomiting  . Losartan Nausea Only  . Oxycodone Anxiety  . Prochlorperazine Other (See Comments)    Reaction:  Unknown . Patient does not remember reaction but she does have vertigo and anxiety along with n and v at times. Could be used to treat any of these   . Reglan [Metoclopramide] Other (See Comments)    Per patient her Dr. Evelina Bucy her off it   . Scopolamine Other (See Comments)    Dizziness, also has vertigo already  . Tape Rash  . Tapentadol Rash     Review of Systems   Review of Systems: Negative Unless Checked Constitutional: [] Weight loss  [] Fever  [] Chills Cardiac: [] Chest pain   []  Atrial Fibrillation  [] Palpitations   [] Shortness of breath when laying flat   [x] Shortness of breath with exertion. [] Shortness of breath at rest Vascular:  [] Pain in legs with walking   [x] Pain in legs with standing [] Pain in legs when laying flat   [x] Claudication    [] Pain in feet when laying flat    [] History of DVT   [] Phlebitis   [x] Swelling in legs   [] Varicose veins   [] Non-healing ulcers Pulmonary:   [] Uses home oxygen   [] Productive cough    [] Hemoptysis   [] Wheeze  []   COPD   [] Asthma Neurologic:  [] Dizziness   [] Seizures  [] Blackouts [] History of stroke   [] History of TIA  [] Aphasia   [] Temporary Blindness   [] Weakness or numbness in arm   [] Weakness or numbness in leg Musculoskeletal:   [] Joint swelling   [] Joint pain   [] Low back pain  []  History of Knee Replacement [] Arthritis [] back Surgeries  []  Spinal Stenosis    Hematologic:  [] Easy bruising  [] Easy bleeding   [] Hypercoagulable state   [x] Anemic Gastrointestinal:  [] Diarrhea   [] Vomiting  [] Gastroesophageal reflux/heartburn   [] Difficulty swallowing. [] Abdominal pain Genitourinary:  [x] Chronic kidney disease   [] Difficult urination  [] Anuric   [] Blood in urine [] Frequent urination  [] Burning with urination   [] Hematuria Skin:  [] Rashes   [] Ulcers [] Wounds Psychological:  [] History of anxiety   []  History of major depression  []  Memory Difficulties      OBJECTIVE:   Physical Exam  BP (!) 175/75 (BP Location: Right Wrist, Patient Position: Sitting, Cuff Size: Normal)   Pulse 68   Resp 12   Ht 5' 5.5" (1.664 m)   Wt 191 lb (86.6 kg)   BMI 31.30 kg/m   Gen: WD/WN, NAD Head: [employer]/AT, No temporalis wasting.  Ear/Nose/Throat: Hearing grossly intact, nares w/o erythema or drainage Eyes: PER, EOMI, sclera nonicteric.  Neck: Supple, no masses.  No JVD.  Pulmonary:  Good air movement, no use of accessory muscles.  Cardiac: RRR Vascular: good thrill and bruit  Vessel Right Left  Radial Palpable Palpable  Dorsalis Pedis Trace Palpable Trace Palpable  Posterior Tibial Trace Palpable Trace Palpable   Gastrointestinal: soft, non-distended. No guarding/no peritoneal signs.  Musculoskeletal: M/S 5/5 throughout.  No deformity or atrophy.  Neurologic: Pain and light touch intact in extremities.  Symmetrical.  Speech is fluent. Motor exam as listed above. Psychiatric: Judgment intact, Mood & affect appropriate for pt's clinical situation. Dermatologic: No Venous rashes. No Ulcers  Noted.  No changes consistent with cellulitis. Lymph : No Cervical lymphadenopathy, no lichenification or skin changes of chronic lymphedema.       ASSESSMENT AND PLAN:  1. ESRD (end stage renal disease) ([place]) Recommend:  The patient is doing well and currently has adequate dialysis access. The patient's dialysis center is not reporting any access issues. Flow pattern is stable when compared to the prior ultrasound.  The patient should have a duplex ultrasound of the dialysis access in 6 months. The patient will follow-up with me in the office after each ultrasound    - VAS [place] (AVF, AVG); Future  2. Bilateral carotid artery stenosis Previously checked in January with a 50-69% bilateral stenosis, patient will follow up in 6 months.  - VAS US CAROTID; Future  3. Type 2 diabetes mellitus with other circulatory complication, unspecified whether long term insulin use (HCC) Continue hypoglycemic medications as already ordered, these medications have been reviewed and there are no changes at this time.  Hgb A1C to be monitored as already arranged by primary service   4. Pain in both lower extremities Patient describes claudication and pain that could be consistent with PAD, especially given her atherosclerotic risk factors.  We will have her return at her earliest convenience for non invasive studies.  - VAS [place] ABI WITH/WO TBI; Future   Current Outpatient Medications on File Prior to Visit  Medication Sig Dispense Refill  . amLODipine (NORVASC) 10 MG tablet Take 10 mg by mouth daily.    Marland Kitchen aspirin EC 81 MG tablet Take 81  mg by mouth daily.    Marland Kitchen atorvastatin (LIPITOR) 20 MG tablet Take 20 mg by mouth every evening.     . Biotin 1 MG CAPS Take 1 mg by mouth daily.    . carvedilol (COREG) 3.125 MG tablet Take 1 tablet (3.125 mg total) by mouth 2 (two) times daily with a meal. 30 tablet 0  . cholecalciferol (VITAMIN D) 400 units TABS tablet Take 400 Units by mouth  daily.    . cyanocobalamin 1000 MCG tablet Take 1,000 mcg by mouth daily.     . cyclobenzaprine (FLEXERIL) 10 MG tablet Take 1 tablet (10 mg total) by mouth 3 (three) times daily as needed. 15 tablet 0  . diclofenac sodium (VOLTAREN) 1 % GEL Apply 1 application topically daily as needed (for rash).    . ferrous sulfate ([employer] IRON) 325 (65 FE) MG tablet Take 325 mg by mouth daily with breakfast.    . FLUoxetine (PROZAC) 20 MG capsule Take 1 capsule (20 mg total) by mouth daily. (Patient taking differently: Take 20 mg by mouth at bedtime. ) 30 capsule 0  . hydrocortisone cream 0.5 % Apply topically 3 (three) times daily. 30 g 0  . insulin aspart (NOVOLOG FLEXPEN) 100 UNIT/ML FlexPen Inject 4-15 Units into the skin 4 (four) times daily as needed for high blood sugar (> 100, per sliding scale).    Marland Kitchen lipase/protease/amylase (CREON) 12000 UNITS CPEP capsule Take 36000 mg in the morning, 24000 midday & 36000 in the evening    . LORazepam (ATIVAN PO) Take by mouth.    . multivitamin (RENA-VIT) TABS tablet Take 1 tablet by mouth daily.     . nitroGLYCERIN (NITROSTAT) 0.4 MG SL tablet Place 0.4 mg under the tongue every 5 (five) minutes as needed for chest pain.     . sevelamer carbonate (RENVELA) 800 MG tablet Take 800-2,400 mg by mouth See admin instructions. Take 2400 mg by mouth 3 times daily with meals and take 800 mg by mouth with snacks    . albuterol (PROVENTIL HFA;VENTOLIN HFA) 108 (90 Base) MCG/ACT inhaler Inhale 2 puffs into the lungs every 6 (six) hours as needed for wheezing or shortness of breath. (Patient not taking: Reported on 03/29/2019) 1 Inhaler 2  . albuterol (PROVENTIL) (2.5 MG/3ML) 0.083% nebulizer solution Take 2.5 mg by nebulization every 4 (four) hours as needed for wheezing or shortness of breath.    Marland Kitchen azithromycin (ZITHROMAX) 250 MG tablet For 4 days (Patient not taking: Reported on 03/29/2019) 4 each 0  . cetirizine (ZYRTEC) 10 MG tablet Take 10 mg by mouth daily.     .  chlorpheniramine-HYDROcodone (TUSSIONEX) 10-8 MG/5ML SUER Take 5 mLs by mouth every 12 (twelve) hours. (Patient not taking: Reported on 03/29/2019) 70 mL 0  . predniSONE (DELTASONE) 10 MG tablet Take 6 tablets (60 mg total) by mouth daily with breakfast. Need to follow up with pcp in 3-5 days regarding biopsy results from temporal artery and decide wether to continue high dose prednsione or not (Patient not taking: Reported on 03/31/2019) 10 tablet 0  . ranitidine (ZANTAC) 300 MG capsule Take 1 capsule by mouth every evening.  1   No current facility-administered medications on file prior to visit.     There are no Patient Instructions on file for this visit. Return in about 6 months (around 10/01/2019) for Carotid stenosis, HDA.   Kris Hartmann, NP  This note was completed with Sales executive.  Any errors are purely unintentional.

## 2019-04-14 ENCOUNTER — Other Ambulatory Visit: Payer: Self-pay | Admitting: Neurology

## 2019-04-14 DIAGNOSIS — G44099 Other trigeminal autonomic cephalgias (TAC), not intractable: Secondary | ICD-10-CM

## 2019-04-15 ENCOUNTER — Other Ambulatory Visit
Admission: RE | Admit: 2019-04-15 | Discharge: 2019-04-15 | Disposition: A | Discharge status: Home / Self Care | Payer: Medicare Other | Source: Ambulatory Visit | Attending: Nephrology | Admitting: Nephrology

## 2019-04-15 DIAGNOSIS — N186 End stage renal disease: Secondary | ICD-10-CM | POA: Diagnosis present

## 2019-04-15 LAB — SEDIMENTATION RATE: Sed Rate: 32 mm/hr — ABNORMAL HIGH (ref 0–30)

## 2019-04-24 ENCOUNTER — Ambulatory Visit: Payer: Medicare Other

## 2019-04-28 ENCOUNTER — Encounter (INDEPENDENT_AMBULATORY_CARE_PROVIDER_SITE_OTHER): Payer: Self-pay

## 2019-04-28 ENCOUNTER — Ambulatory Visit (INDEPENDENT_AMBULATORY_CARE_PROVIDER_SITE_OTHER): Payer: Medicare Other

## 2019-04-28 ENCOUNTER — Encounter (INDEPENDENT_AMBULATORY_CARE_PROVIDER_SITE_OTHER): Payer: Self-pay | Admitting: Vascular Surgery

## 2019-04-28 ENCOUNTER — Ambulatory Visit (INDEPENDENT_AMBULATORY_CARE_PROVIDER_SITE_OTHER): Payer: Medicare Other | Admitting: Vascular Surgery

## 2019-04-28 ENCOUNTER — Other Ambulatory Visit: Payer: Self-pay

## 2019-04-28 VITALS — BP 167/72 | HR 63 | Resp 16 | Wt 189.0 lb

## 2019-04-28 DIAGNOSIS — I70213 Atherosclerosis of native arteries of extremities with intermittent claudication, bilateral legs: Secondary | ICD-10-CM | POA: Diagnosis not present

## 2019-04-28 DIAGNOSIS — I10 Essential (primary) hypertension: Secondary | ICD-10-CM

## 2019-04-28 DIAGNOSIS — I890 Lymphedema, not elsewhere classified: Secondary | ICD-10-CM | POA: Diagnosis not present

## 2019-04-28 DIAGNOSIS — I872 Venous insufficiency (chronic) (peripheral): Secondary | ICD-10-CM

## 2019-04-28 DIAGNOSIS — E1159 Type 2 diabetes mellitus with other circulatory complications: Secondary | ICD-10-CM

## 2019-04-28 DIAGNOSIS — M79604 Pain in right leg: Secondary | ICD-10-CM

## 2019-04-28 DIAGNOSIS — I6523 Occlusion and stenosis of bilateral carotid arteries: Secondary | ICD-10-CM

## 2019-04-28 DIAGNOSIS — M79605 Pain in left leg: Secondary | ICD-10-CM | POA: Diagnosis not present

## 2019-04-28 DIAGNOSIS — N186 End stage renal disease: Secondary | ICD-10-CM

## 2019-04-28 DIAGNOSIS — Z992 Dependence on renal dialysis: Secondary | ICD-10-CM

## 2019-04-28 NOTE — Progress Notes (Signed)
MRN : [id number]  [name] is a 58 y.o. (11-09-1960) female who presents with chief complaint of No chief complaint on file. Marland Kitchen  History of Present Illness:   The patient is seen for follow up evaluation of carotid stenosis. The carotid stenosis followed by ultrasoundbut she is overdue for a study.   The patient denies amaurosis fugax. There is no recent history of TIA symptoms or focal motor deficits. There is no prior documented CVA.  The patient is taking enteric-coated aspirin 81 mg daily.  There is no history of migraine headaches. There is no history of seizures.  The patient also note worsening leg swelling.  The swelling has persisted and the pain associated with swelling continues. There have not been any interval development of a ulcerations or wounds.  Since the previous visit the patient has been wearing graduated compression stockings and has noted little if any improvement in the lymphedema. The patient has been using compression routinely morning until night.  The patient also states elevation during the day and exercise is being done too.  Currently the patient is maintained via aleft brachial axillary graft. The patient notes no increase in bleeding time after decannulation. The patient has not been informed that there is any problem with increased recirculation.  The patient denies hand pain or other symptoms consistent with steal phenomena. No significant arm swelling.  The patient denies redness or swelling at the access site. The patient denies fever or chills at home or while on dialysis.  The patient has a history of coronary artery disease, no recent episodes of angina or shortness of breath. The patient denies PAD or claudication symptoms. There is a history of hyperlipidemia which is being treated with a statin.  Previous duplex ultrasound of the AV access shows a patent access.  The previously noted stenosis is unchanged compared to last  study.  The patient has a flow volume of 1342 with a patent Dril throughout.  There is some mild narrowing in the mid graft with no significant velocities increased.   No outpatient medications have been marked as taking for the 04/28/19 encounter (Appointment) with Delana Meyer, Dolores Lory, MD.    Past Medical History:  Diagnosis Date   Anemia    Anginal pain ([place])    Anxiety    Arthritis    Asthma    Broken wrist    Bronchitis    chronic diastolic CHF 5/62/1308   COPD (chronic obstructive pulmonary disease) ([place])    Coronary artery disease    a. cath 2013: stenting to RCA (report not available); b. cath 2014: LM nl, pLAD 40%, mLAD nl, ost LCx 40%, mid LCx nl, pRCA 30% @ site of prior stent, mRCA 50%   Depression    Diabetes mellitus without complication ([place])    Diabetic neuropathy ([place])    dialysis 2006   Diverticulosis    Dizziness    Dyspnea    Elevated lipids    Environmental and seasonal allergies    ESRD (end stage renal disease) on dialysis ([place])    M-W-F   GERD (gastroesophageal reflux disease)    Headache    History of hiatal hernia    HOH (hard of hearing)    Hx of pancreatitis 2015   Hypertension    Lower extremity edema    Mitral regurgitation    a. echo 10/2013: EF 62%, noWMA, mildly dilated LA, mild to mod MR/TR, GR1DD   Myocardial infarction ([place])    Orthopnea  Pneumonia    Renal cancer ([place])    Renal insufficiency    Pt is on dialysis on M,W + F.   Wheezing     Past Surgical History:  Procedure Laterality Date   A/V SHUNTOGRAM Left 01/20/2018   Procedure: A/V SHUNTOGRAM;  Surgeon: Algernon Huxley, MD;  Location: [place] [facility];  Service: Cardiovascular;  Laterality: Left;   ABDOMINAL HYSTERECTOMY  1992   AMPUTATION TOE Left 10/02/2017   Procedure: AMPUTATION TOE-LEFT GREAT TOE;  Surgeon: Albertine Patricia, DPM;  Location: [facility];  Service: Podiatry;  Laterality: Left;   APPENDECTOMY     ARTERY BIOPSY Right  10/11/2018   Procedure: BIOPSY TEMPORAL ARTERY;  Surgeon: Vickie Epley, MD;  Location: [facility];  Service: General;  Laterality: Right;   CARDIAC CATHETERIZATION Left 07/26/2015   Procedure: Left Heart Cath and Coronary Angiography;  Surgeon: Dionisio David, MD;  Location: [place] [facility];  Service: Cardiovascular;  Laterality: Left;   CATARACT EXTRACTION W/ INTRAOCULAR LENS IMPLANT Right    CATARACT EXTRACTION W/PHACO Left 03/10/2017   Procedure: CATARACT EXTRACTION PHACO AND INTRAOCULAR LENS PLACEMENT ([place]);  Surgeon: Birder Robson, MD;  Location: [facility];  Service: Ophthalmology;  Laterality: Left;  [place] 00:51.9 AP% 14.2 CDE 7.39 fluid pack lot # [id number]   CHOLECYSTECTOMY     COLONOSCOPY WITH PROPOFOL N/A 08/12/2016   Procedure: COLONOSCOPY WITH PROPOFOL;  Surgeon: Lollie Sails, MD;  Location: [facility];  Service: Endoscopy;  Laterality: N/A;   DIALYSIS FISTULA CREATION Left    upper arm   dialysis grafts     ESOPHAGOGASTRODUODENOSCOPY N/A 03/08/2015   Procedure: ESOPHAGOGASTRODUODENOSCOPY (EGD);  Surgeon: Manya Silvas, MD;  Location: [facility] ENDOSCOPY;  Service: Endoscopy;  Laterality: N/A;   ESOPHAGOGASTRODUODENOSCOPY (EGD) WITH PROPOFOL N/A 03/18/2016   Procedure: ESOPHAGOGASTRODUODENOSCOPY (EGD) WITH PROPOFOL;  Surgeon: [name], MD;  Location: [facility] ENDOSCOPY;  Service: Endoscopy;  Laterality: N/A;   EYE SURGERY Right 2018   FECAL TRANSPLANT N/A 08/23/2015   Procedure: FECAL TRANSPLANT;  Surgeon: Manya Silvas, MD;  Location: [facility] ENDOSCOPY;  Service: Endoscopy;  Laterality: N/A;   HAND SURGERY Bilateral    PERIPHERAL VASCULAR CATHETERIZATION N/A 12/20/2015   Procedure: Thrombectomy of dialysis access versus permcath placement;  Surgeon: Algernon Huxley, MD;  Location: [place] [facility];  Service: Cardiovascular;  Laterality: N/A;   PERIPHERAL VASCULAR CATHETERIZATION N/A 12/20/2015   Procedure: A/V Shunt Intervention;  Surgeon: Algernon Huxley, MD;   Location: [place] [facility];  Service: Cardiovascular;  Laterality: N/A;   PERIPHERAL VASCULAR CATHETERIZATION N/A 12/20/2015   Procedure: A/V Shuntogram/Fistulagram;  Surgeon: Algernon Huxley, MD;  Location: [place] [facility];  Service: Cardiovascular;  Laterality: N/A;   PERIPHERAL VASCULAR CATHETERIZATION N/A 01/02/2016   Procedure: A/V Shuntogram/Fistulagram;  Surgeon: Algernon Huxley, MD;  Location: [place] [facility];  Service: Cardiovascular;  Laterality: N/A;   PERIPHERAL VASCULAR CATHETERIZATION N/A 01/02/2016   Procedure: A/V Shunt Intervention;  Surgeon: Algernon Huxley, MD;  Location: [place] [facility];  Service: Cardiovascular;  Laterality: N/A;    Social History Social History   Tobacco Use   Smoking status: Former Smoker    Packs/day: 0.50    Years: 40.00    Pack years: 20.00    Types: Cigarettes    Quit date: 02/13/2015    Years since quitting: 4.2   Smokeless tobacco: Never Used  Substance Use Topics   Alcohol use: Not Currently    Comment: glass wine week  per pt   Drug use: Yes    Types: Marijuana    Comment: once a day    Family History Family History  Problem Relation Age of Onset   Kidney disease Mother    Diabetes Mother    Cancer Father    Kidney disease Sister     Allergies  Allergen Reactions   Ace Inhibitors Swelling and Anaphylaxis   Ativan [Lorazepam] Other (See Comments)    Reaction:Hallucinations and headaches   Compazine [Prochlorperazine Edisylate] Anaphylaxis, Nausea And Vomiting and Other (See Comments)    Other reaction(s): dystonia from this vs. Reglan, 23 Jul - patient relates that she takes promethazine frequently with no problems   Sumatriptan Succinate Other (See Comments)    Other reaction(s): delirium and hallucinations per [facility]   Dilaudid [Hydromorphone Hcl] Other (See Comments)    Delirium    Ondansetron Other (See Comments)    hallucinations     Zofran [Ondansetron Hcl] Other (See Comments)     Reaction:  hallucinations    Codeine Nausea And Vomiting   Gabapentin Other (See Comments)    Reaction:  Unknown     Lac Bovis Nausea And Vomiting   Losartan Nausea Only   Oxycodone Anxiety   Prochlorperazine Other (See Comments)    Reaction:  Unknown . Patient does not remember reaction but she does have vertigo and anxiety along with n and v at times. Could be used to treat any of these    Reglan [Metoclopramide] Other (See Comments)    Per patient her Dr. Evelina Bucy her off it    Scopolamine Other (See Comments)    Dizziness, also has vertigo already   Tape Rash   Tapentadol Rash     REVIEW OF SYSTEMS (Negative unless checked)  Constitutional: [] Weight loss  [] Fever  [] Chills Cardiac: [] Chest pain   [] Chest pressure   [] Palpitations   [] Shortness of breath when laying flat   [] Shortness of breath with exertion. Vascular:  [] Pain in legs with walking   [x] Pain in legs at rest  [] History of DVT   [] Phlebitis   [x] Swelling in legs   [] Varicose veins   [] Non-healing ulcers Pulmonary:   [] Uses home oxygen   [] Productive cough   [] Hemoptysis   [] Wheeze  [] COPD   [] Asthma Neurologic:  [] Dizziness   [] Seizures   [] History of stroke   [] History of TIA  [] Aphasia   [] Vissual changes   [] Weakness or numbness in arm   [] Weakness or numbness in leg Musculoskeletal:   [] Joint swelling   [] Joint pain   [] Low back pain Hematologic:  [] Easy bruising  [] Easy bleeding   [] Hypercoagulable state   [] Anemic Gastrointestinal:  [] Diarrhea   [] Vomiting  [x] Gastroesophageal reflux/heartburn   [] Difficulty swallowing. Genitourinary:  [x] Chronic kidney disease   [] Difficult urination  [] Frequent urination   [] Blood in urine Skin:  [] Rashes   [] Ulcers  Psychological:  [] History of anxiety   []  History of major depression.  Physical Examination  There were no vitals filed for this visit. There is no height or weight on file to calculate BMI. Gen: WD/WN, NAD Head: [place]/AT, No temporalis wasting.    Ear/Nose/Throat: Hearing grossly intact, nares w/o erythema or drainage Eyes: PER, EOMI, sclera nonicteric.  Neck: Supple, no large masses.   Pulmonary:  Good air movement, no audible wheezing bilaterally, no use of accessory muscles.  Cardiac: RRR, no JVD Vascular: bilateral carotid bruit; left arm AV access good thrill good bruit Vessel Right Left  Radial Palpable Palpable  Brachial Palpable Palpable  Carotid Palpable Palpable  PT Trace Palpable Not Palpable  DP Palpable Not Palpable  Gastrointestinal: Non-distended. No guarding/no peritoneal signs.  Musculoskeletal: M/S 5/5 throughout.  No deformity or atrophy.  Neurologic: CN 2-12 intact. Symmetrical.  Speech is fluent. Motor exam as listed above. Psychiatric: Judgment intact, Mood & affect appropriate for pt's clinical situation. Dermatologic: No rashes or ulcers noted.  No changes consistent with cellulitis. Lymph : No lichenification or skin changes of chronic lymphedema.  CBC Lab Results  Component Value Date   WBC 5.4 03/29/2019   HGB 10.9 (L) 03/29/2019   HCT 33.6 (L) 03/29/2019   MCV 93.1 03/29/2019   PLT 197 03/29/2019    BMET    Component Value Date/Time   NA 128 (L) 10/11/2018 0754   NA 135 (L) 09/15/2014 0948   K 4.6 10/11/2018 0754   K 4.8 09/15/2014 0948   CL 92 (L) 10/11/2018 0754   CL 99 09/15/2014 0948   CO2 25 10/11/2018 0754   CO2 26 09/15/2014 0948   GLUCOSE 218 (H) 10/11/2018 0754   GLUCOSE 118 (H) 09/15/2014 0948   BUN 72 (H) 10/11/2018 0754   BUN 19 (H) 09/15/2014 0948   CREATININE 7.48 (H) 10/11/2018 0754   CREATININE 6.79 (H) 09/15/2014 0948   CALCIUM 8.1 (L) 10/11/2018 0754   CALCIUM 8.3 (L) 09/15/2014 0948   GFRNONAA 5 (L) 10/11/2018 0754   GFRNONAA 7 (L) 09/15/2014 0948   GFRNONAA 6 (L) 05/31/2014 0432   GFRAA 6 (L) 10/11/2018 0754   GFRAA 8 (L) 09/15/2014 0948   GFRAA 7 (L) 05/31/2014 0432   CrCl cannot be calculated (Patient's most recent lab result is older than the maximum 21  days allowed.).  COAG Lab Results  Component Value Date   INR 0.93 09/30/2017   INR 1.08 04/21/2016   INR 0.97 12/28/2015    Radiology Vas US Duplex Dialysis Access (avf, Avg)  Result Date: 03/31/2019 DIALYSIS ACCESS Access Site: Left Upper Extremity. History: Left brachial-axillary AVG on 08/24/13; H/O left AVG & outflow vein          stents as well as DRIL. Comparison Study: 01/07/2018 Performing Technologist: Almira Coaster RVS  Examination Guidelines: A complete evaluation includes B-mode imaging, spectral Doppler, color Doppler, and power Doppler as needed of all accessible portions of each vessel. Unilateral testing is considered an integral part of a complete examination. Limited examinations for reoccurring indications may be performed as noted.  Findings:   +--------------------+----------+-----------------+--------------+  AVG                  PSV (cm/s) Flow Vol (mL/min)    Describe     +--------------------+----------+-----------------+--------------+  Native artery inflow    229           1342                        +--------------------+----------+-----------------+--------------+  Arterial anastomosis    495                                       +--------------------+----------+-----------------+--------------+  Prox graft              465                                       +--------------------+----------+-----------------+--------------+  Mid graft               259                       Mild Narrowing  +--------------------+----------+-----------------+--------------+  Distal graft            242                                       +--------------------+----------+-----------------+--------------+  Venous anastomosis      166                                       +--------------------+----------+-----------------+--------------+ +---------------+-------------+----------+---------+----------+----------------+                  Diameter (cm) Depth (cm) Branching PSV (cm/s)   Flow  Volume                                                                        (ml/min)      +---------------+-------------+----------+---------+----------+----------------+  Lt Rad Art Dist                                        82                       +---------------+-------------+----------+---------+----------+----------------+  Lt Arm Dril                                            85                        Forearm Prox                                                                    +---------------+-------------+----------+---------+----------+----------------+  Lt Arm Dril ACF                                       235                       +---------------+-------------+----------+---------+----------+----------------+  Lt Arm Dril                                            71  Dist U/Arm                                                                      +---------------+-------------+----------+---------+----------+----------------+  Lt Arm Dril Mid                                        76                        Upper Arm                                                                       +---------------+-------------+----------+---------+----------+----------------+  Lt Arm Dril                                           224                        Prox U/Arm                                                                      +---------------+-------------+----------+---------+----------+----------------+  Summary: The Left upper AVG with Dril appears to be patent throughout. Mild narrowing seen with no excessive Velocities.  *See table(s) above for measurements and observations.  Diagnosing physician: Hortencia Pilar MD Electronically signed by Hortencia Pilar MD on 03/31/2019 at 5:52:39 PM.    --------------------------------------------------------------------------------   Final       Assessment/Plan 1. Lymphedema Recommend:  No surgery or intervention at this point  in time.    I have reviewed my previous discussion with the patient regarding swelling and why it causes symptoms.  Patient will continue wearing graduated compression stockings class 1 (20-30 mmHg) on a daily basis. The patient will  beginning wearing the stockings first thing in the morning and removing them in the evening. The patient is instructed specifically not to sleep in the stockings.    In addition, behavioral modification including several periods of elevation of the lower extremities during the day will be continued.  This was reviewed with the patient during the initial visit.  The patient will also continue routine exercise, especially walking on a daily basis as was discussed during the initial visit.    Despite conservative treatments including graduated compression therapy class 1 and behavioral modification including exercise and elevation the patient  has not obtained adequate control of the lymphedema.  The patient still has stage 3 lymphedema and therefore, I believe that a lymph pump should be added to improve the control  of the patient's lymphedema.  Additionally, a lymph pump is warranted because it will reduce the risk of cellulitis and ulceration in the future.  Patient should follow-up in six months    2. Atherosclerosis of native artery of both lower extremities with intermittent claudication (HCC)  Recommend:  The patient has evidence of atherosclerosis of the lower extremities with claudication.  The patient does not voice lifestyle limiting changes at this point in time.  Noninvasive studies do not suggest clinically significant change.  No invasive studies, angiography or surgery at this time The patient should continue walking and begin a more formal exercise program.  The patient should continue antiplatelet therapy and aggressive treatment of the lipid abnormalities  No changes in the patient's medications at this time  The patient should continue wearing  graduated compression socks 10-15 mmHg strength to control the mild edema.    3. Bilateral carotid artery stenosis Recommend:  Given the patient's asymptomatic subcritical stenosis no further invasive testing or surgery at this time.  Previous duplex ultrasound shows <70% stenosis bilaterally.  Continue antiplatelet therapy as prescribed Continue management of CAD, HTN and Hyperlipidemia Healthy heart diet,  encouraged exercise at least 4 times per week Follow up in 6 months with duplex ultrasound and physical exam   - VAS US CAROTID; Future  4. Chronic venous insufficiency No surgery or intervention at this point in time.    I have had a long discussion with the patient regarding venous insufficiency and why it  causes symptoms. I have discussed with the patient the chronic skin changes that accompany venous insufficiency and the long term sequela such as infection and ulceration.  Patient will begin wearing graduated compression stockings class 1 (20-30 mmHg) or compression wraps on a daily basis a prescription was given. The patient will put the stockings on first thing in the morning and removing them in the evening. The patient is instructed specifically not to sleep in the stockings.    In addition, behavioral modification including several periods of elevation of the lower extremities during the day will be continued. I have demonstrated that proper elevation is a position with the ankles at heart level.  The patient is instructed to begin routine exercise, especially walking on a daily basis   5. End-stage renal disease on hemodialysis [facility]) Recommend:  The patient is doing well and currently has adequate dialysis access. The patient's dialysis center is not reporting any access issues. Flow pattern is stable when compared to the prior ultrasound.  The patient should have a duplex ultrasound of the dialysis access in 6 months. The patient will follow-up with me in the  office after each ultrasound     6. Type 2 diabetes mellitus with other circulatory complication, unspecified whether long term insulin use (HCC) Continue hypoglycemic medications as already ordered, these medications have been reviewed and there are no changes at this time.  Hgb A1C to be monitored as already arranged by primary service   7. Essential hypertension Continue antihypertensive medications as already ordered, these medications have been reviewed and there are no changes at this time.      Hortencia Pilar, MD  04/28/2019 10:24 AM

## 2019-05-01 ENCOUNTER — Encounter (INDEPENDENT_AMBULATORY_CARE_PROVIDER_SITE_OTHER): Payer: Self-pay | Admitting: Vascular Surgery

## 2019-05-01 DIAGNOSIS — I70219 Atherosclerosis of native arteries of extremities with intermittent claudication, unspecified extremity: Secondary | ICD-10-CM | POA: Insufficient documentation

## 2019-05-03 ENCOUNTER — Other Ambulatory Visit: Payer: Self-pay

## 2019-05-03 ENCOUNTER — Ambulatory Visit
Admission: RE | Admit: 2019-05-03 | Discharge: 2019-05-03 | Disposition: A | Discharge status: Home / Self Care | Payer: Medicare Other | Source: Ambulatory Visit | Attending: Neurology | Admitting: Neurology

## 2019-05-03 DIAGNOSIS — G44099 Other trigeminal autonomic cephalgias (TAC), not intractable: Secondary | ICD-10-CM | POA: Diagnosis not present

## 2019-06-16 ENCOUNTER — Ambulatory Visit
Admission: RE | Admit: 2019-06-16 | Discharge: 2019-06-16 | Disposition: A | Discharge status: Home / Self Care | Payer: Medicare Other | Source: Ambulatory Visit | Attending: Urology | Admitting: Urology

## 2019-06-16 ENCOUNTER — Other Ambulatory Visit: Payer: Self-pay

## 2019-06-16 DIAGNOSIS — N2889 Other specified disorders of kidney and ureter: Secondary | ICD-10-CM | POA: Insufficient documentation

## 2019-06-16 MED ORDER — IOHEXOL 300 MG/ML  SOLN: 100.0000 mL | Freq: Once | INTRAMUSCULAR | Status: DC | PRN

## 2019-06-20 ENCOUNTER — Telehealth: Payer: Self-pay | Admitting: Urology

## 2019-06-20 ENCOUNTER — Telehealth: Payer: Self-pay | Admitting: *Deleted

## 2019-06-20 DIAGNOSIS — N2889 Other specified disorders of kidney and ureter: Secondary | ICD-10-CM

## 2019-06-20 NOTE — Telephone Encounter (Signed)
Received a secure message last week while out of the office that they were unable to obtain IV access for this patient for CT scan.  I do not recall the details of this as it was sent to her message which has since disappeared from the messages (3 days).  With start with a renal ultrasound instead for now and see if we can see these lesions.  If not, we will figure out how to get IV access for future scan.  Hollice Espy, MD

## 2019-06-20 NOTE — Telephone Encounter (Signed)
Left VM to get appointment rescheduled-did not have CT scan completed.

## 2019-06-21 ENCOUNTER — Ambulatory Visit: Payer: Medicare Other | Admitting: Urology

## 2019-06-28 ENCOUNTER — Ambulatory Visit: Payer: Medicare Other

## 2019-06-29 ENCOUNTER — Inpatient Hospital Stay: Payer: Medicare Other | Attending: Oncology

## 2019-07-07 ENCOUNTER — Other Ambulatory Visit: Payer: Self-pay

## 2019-07-07 ENCOUNTER — Ambulatory Visit
Admission: RE | Admit: 2019-07-07 | Discharge: 2019-07-07 | Disposition: A | Discharge status: Home / Self Care | Payer: Medicare Other | Source: Ambulatory Visit | Attending: Urology | Admitting: Urology

## 2019-07-07 DIAGNOSIS — N2889 Other specified disorders of kidney and ureter: Secondary | ICD-10-CM | POA: Insufficient documentation

## 2019-07-14 ENCOUNTER — Other Ambulatory Visit: Payer: Self-pay

## 2019-07-14 ENCOUNTER — Other Ambulatory Visit: Payer: Self-pay | Admitting: Radiology

## 2019-07-14 ENCOUNTER — Ambulatory Visit (INDEPENDENT_AMBULATORY_CARE_PROVIDER_SITE_OTHER): Payer: Medicare Other | Admitting: Urology

## 2019-07-14 ENCOUNTER — Encounter: Payer: Self-pay | Admitting: Urology

## 2019-07-14 VITALS — BP 166/73 | HR 59 | Ht 65.5 in | Wt 189.0 lb

## 2019-07-14 DIAGNOSIS — I6523 Occlusion and stenosis of bilateral carotid arteries: Secondary | ICD-10-CM | POA: Diagnosis not present

## 2019-07-14 DIAGNOSIS — N2889 Other specified disorders of kidney and ureter: Secondary | ICD-10-CM

## 2019-07-14 NOTE — Progress Notes (Signed)
07/14/2019 11:04 AM   [name] 15-Mar-1961 [id number]  Referring provider: Freddy Finner, NP [place],  [place] [address]  Chief Complaint  Patient presents with  . Follow-up    HPI: 58 year old female with multiple medical comorbidities including end-stage renal disease on dialysis, COPD and CHF is being followed for right renal mass x2.  She is last seen virtually in 11/2018.  She returns today with interval imaging.  Unfortunately, despite innumerable attempts, IV access for CT scan with and without contrast with this and able to be performed thus the CT abdomen with and without contrast was canceled.  Renal ultrasound was ordered which unfortunately did not clearly demonstrate the lesions in question.  Most recent CT scan from 12/07/2018 shows interval growth of the lesion since 2018, the right medial upper pole lesion now measures 14 x 13 x 12.5 mm, up from 11.5 x 10.5 x 9.5 in 2019.  In addition, an enhancing upper mid pole posterior lesion now measures 12.0 x 10.5 mm x 15 mm, up from 11.5 x 10.0 x 13 mm.   She reports that overall, she is doing significantly better over the past several years than previously.  Her only complaint right now is sleep apnea causing fatigue but she is back to doing most of her ADLs and overall is clinically improving despite her multiple medical comorbidities.  No flank pain.  Voids variable amounts.  No voiding complaints.   PMH: Past Medical History:  Diagnosis Date  . Anemia   . Anginal pain ([place])   . Anxiety   . Arthritis   . Asthma   . Broken wrist   . Bronchitis   . chronic diastolic CHF 2/68/3419  . COPD (chronic obstructive pulmonary disease) ([place])   . Coronary artery disease    a. cath 2013: stenting to RCA (report not available); b. cath 2014: LM nl, pLAD 40%, mLAD nl, ost LCx 40%, mid LCx nl, pRCA 30% @ site of prior stent, mRCA 50%  . Depression   . Diabetes mellitus without complication ([place])   .  Diabetic neuropathy ([place])   . dialysis 2006  . Diverticulosis   . Dizziness   . Dyspnea   . Elevated lipids   . Environmental and seasonal allergies   . ESRD (end stage renal disease) on dialysis ([place])    M-W-F  . GERD (gastroesophageal reflux disease)   . Headache   . History of hiatal hernia   . HOH (hard of hearing)   . Hx of pancreatitis 2015  . Hypertension   . Lower extremity edema   . Mitral regurgitation    a. echo 10/2013: EF 62%, noWMA, mildly dilated LA, mild to mod MR/TR, GR1DD  . Myocardial infarction ([place])   . Orthopnea   . Pneumonia   . Renal cancer ([place])   . Renal insufficiency    Pt is on dialysis on M,W + F.  . Wheezing     Surgical History: Past Surgical History:  Procedure Laterality Date  . A/V SHUNTOGRAM Left 01/20/2018   Procedure: A/V SHUNTOGRAM;  Surgeon: Algernon Huxley, MD;  Location: [facility];  Service: Cardiovascular;  Laterality: Left;  . ABDOMINAL HYSTERECTOMY  1992  . AMPUTATION TOE Left 10/02/2017   Procedure: AMPUTATION TOE-LEFT GREAT TOE;  Surgeon: Albertine Patricia, DPM;  Location: [facility];  Service: Podiatry;  Laterality: Left;  . APPENDECTOMY    . ARTERY BIOPSY Right 10/11/2018   Procedure: BIOPSY TEMPORAL ARTERY;  Surgeon: Vickie Epley, MD;  Location: [facility];  Service: General;  Laterality: Right;  . CARDIAC CATHETERIZATION Left 07/26/2015   Procedure: Left Heart Cath and Coronary Angiography;  Surgeon: Dionisio David, MD;  Location: [facility];  Service: Cardiovascular;  Laterality: Left;  . CATARACT EXTRACTION W/ INTRAOCULAR LENS IMPLANT Right   . CATARACT EXTRACTION W/PHACO Left 03/10/2017   Procedure: CATARACT EXTRACTION PHACO AND INTRAOCULAR LENS PLACEMENT (IOC);  Surgeon: Birder Robson, MD;  Location: [facility];  Service: Ophthalmology;  Laterality: Left;  [place] 00:51.9 AP% 14.2 CDE 7.39 fluid pack lot # [id number]  . CHOLECYSTECTOMY    . COLONOSCOPY WITH PROPOFOL N/A 08/12/2016   Procedure: COLONOSCOPY WITH  PROPOFOL;  Surgeon: Lollie Sails, MD;  Location: [facility];  Service: Endoscopy;  Laterality: N/A;  . DIALYSIS FISTULA CREATION Left    upper arm  . dialysis grafts    . ESOPHAGOGASTRODUODENOSCOPY N/A 03/08/2015   Procedure: ESOPHAGOGASTRODUODENOSCOPY (EGD);  Surgeon: Manya Silvas, MD;  Location: [facility] ENDOSCOPY;  Service: Endoscopy;  Laterality: N/A;  . ESOPHAGOGASTRODUODENOSCOPY (EGD) WITH PROPOFOL N/A 03/18/2016   Procedure: ESOPHAGOGASTRODUODENOSCOPY (EGD) WITH PROPOFOL;  Surgeon: [name], MD;  Location: [facility] ENDOSCOPY;  Service: Endoscopy;  Laterality: N/A;  . EYE SURGERY Right 2018  . FECAL TRANSPLANT N/A 08/23/2015   Procedure: FECAL TRANSPLANT;  Surgeon: Manya Silvas, MD;  Location: [facility] ENDOSCOPY;  Service: Endoscopy;  Laterality: N/A;  . HAND SURGERY Bilateral   . PERIPHERAL VASCULAR CATHETERIZATION N/A 12/20/2015   Procedure: Thrombectomy of dialysis access versus permcath placement;  Surgeon: Algernon Huxley, MD;  Location: [place] [facility];  Service: Cardiovascular;  Laterality: N/A;  . PERIPHERAL VASCULAR CATHETERIZATION N/A 12/20/2015   Procedure: A/V Shunt Intervention;  Surgeon: Algernon Huxley, MD;  Location: [place] [facility];  Service: Cardiovascular;  Laterality: N/A;  . PERIPHERAL VASCULAR CATHETERIZATION N/A 12/20/2015   Procedure: A/V Shuntogram/Fistulagram;  Surgeon: Algernon Huxley, MD;  Location: [place] [facility];  Service: Cardiovascular;  Laterality: N/A;  . PERIPHERAL VASCULAR CATHETERIZATION N/A 01/02/2016   Procedure: A/V Shuntogram/Fistulagram;  Surgeon: Algernon Huxley, MD;  Location: [place] [facility];  Service: Cardiovascular;  Laterality: N/A;  . PERIPHERAL VASCULAR CATHETERIZATION N/A 01/02/2016   Procedure: A/V Shunt Intervention;  Surgeon: Algernon Huxley, MD;  Location: [place] [facility];  Service: Cardiovascular;  Laterality: N/A;    Home Medications:  Allergies as of 07/14/2019      Reactions   Ace Inhibitors Swelling, Anaphylaxis    Ativan [lorazepam] Other (See Comments)   Reaction:Hallucinations and headaches   Compazine [prochlorperazine Edisylate] Anaphylaxis, Nausea And Vomiting, Other (See Comments)   Other reaction(s): dystonia from this vs. Reglan, 23 Jul - patient relates that she takes promethazine frequently with no problems   Sumatriptan Succinate Other (See Comments)   Other reaction(s): delirium and hallucinations per [facility] records   Dilaudid [hydromorphone Hcl] Other (See Comments)   Delirium   Ondansetron Other (See Comments)   hallucinations    Zofran [ondansetron Hcl] Other (See Comments)   Reaction:  hallucinations    Codeine Nausea And Vomiting   Gabapentin Other (See Comments)   Reaction:  Unknown    Lac Bovis Nausea And Vomiting   Losartan Nausea Only   Oxycodone Anxiety   Prochlorperazine Other (See Comments)   Reaction:  Unknown . Patient does not remember reaction but she does have vertigo and anxiety along with n and v at times. Could be used to treat any of  these   Reglan [metoclopramide] Other (See Comments)   Per patient her Dr. Evelina Bucy her off it    Scopolamine Other (See Comments)   Dizziness, also has vertigo already   Tape Rash   Tapentadol Rash      Medication List       Accurate as of July 14, 2019 11:04 AM. If you have any questions, ask your nurse or doctor.        STOP taking these medications   azithromycin 250 MG tablet Commonly known as: ZITHROMAX Stopped by: Hollice Espy, MD     TAKE these medications   albuterol (2.5 MG/3ML) 0.083% nebulizer solution Commonly known as: PROVENTIL Take 2.5 mg by nebulization every 4 (four) hours as needed for wheezing or shortness of breath.   albuterol 108 (90 Base) MCG/ACT inhaler Commonly known as: VENTOLIN HFA Inhale 2 puffs into the lungs every 6 (six) hours as needed for wheezing or shortness of breath.   amLODipine 10 MG tablet Commonly known as: NORVASC Take 10 mg by mouth daily.   aspirin EC 81 MG tablet  Take 81 mg by mouth daily.   ATIVAN PO Take by mouth.   atorvastatin 20 MG tablet Commonly known as: LIPITOR Take 20 mg by mouth every evening.   Biotin 1 MG Caps Take 1 mg by mouth daily.   carvedilol 3.125 MG tablet Commonly known as: COREG Take 1 tablet (3.125 mg total) by mouth 2 (two) times daily with a meal.   cetirizine 10 MG tablet Commonly known as: ZYRTEC Take 10 mg by mouth daily.   chlorpheniramine-HYDROcodone 10-8 MG/5ML Suer Commonly known as: TUSSIONEX Take 5 mLs by mouth every 12 (twelve) hours.   cholecalciferol 10 MCG (400 UNIT) Tabs tablet Commonly known as: VITAMIN D3 Take 400 Units by mouth daily.   clopidogrel 75 MG tablet Commonly known as: PLAVIX Take 75 mg by mouth daily.   [employer] Iron 325 (65 FE) MG tablet Generic drug: ferrous sulfate Take 325 mg by mouth daily with breakfast.   cyanocobalamin 1000 MCG tablet Take 1,000 mcg by mouth daily.   cyclobenzaprine 10 MG tablet Commonly known as: FLEXERIL Take 1 tablet (10 mg total) by mouth 3 (three) times daily as needed.   diclofenac sodium 1 % Gel Commonly known as: VOLTAREN Apply 1 application topically daily as needed (for rash).   FLUoxetine 20 MG capsule Commonly known as: PROZAC Take 1 capsule (20 mg total) by mouth daily. What changed: when to take this   furosemide 80 MG tablet Commonly known as: LASIX Take 80 mg by mouth daily.   hydrocortisone cream 0.5 % Apply topically 3 (three) times daily.   lipase/protease/amylase 12000 units Cpep capsule Commonly known as: CREON Take 36000 mg in the morning, 24000 midday & 36000 in the evening   multivitamin Tabs tablet Take 1 tablet by mouth daily.   nitroGLYCERIN 0.4 MG SL tablet Commonly known as: NITROSTAT Place 0.4 mg under the tongue every 5 (five) minutes as needed for chest pain.   NovoLOG FlexPen 100 UNIT/ML FlexPen Generic drug: insulin aspart Inject 4-15 Units into the skin 4 (four) times daily as needed for high  blood sugar (> 100, per sliding scale).   predniSONE 10 MG tablet Commonly known as: DELTASONE Take 6 tablets (60 mg total) by mouth daily with breakfast. Need to follow up with pcp in 3-5 days regarding biopsy results from temporal artery and decide wether to continue high dose prednsione or not   ranitidine 300  MG capsule Commonly known as: ZANTAC Take 1 capsule by mouth every evening.   sevelamer carbonate 800 MG tablet Commonly known as: RENVELA Take 800-2,400 mg by mouth See admin instructions. Take 2400 mg by mouth 3 times daily with meals and take 800 mg by mouth with snacks       Allergies:  Allergies  Allergen Reactions  . Ace Inhibitors Swelling and Anaphylaxis  . Ativan [Lorazepam] Other (See Comments)    Reaction:Hallucinations and headaches  . Compazine [Prochlorperazine Edisylate] Anaphylaxis, Nausea And Vomiting and Other (See Comments)    Other reaction(s): dystonia from this vs. Reglan, 23 Jul - patient relates that she takes promethazine frequently with no problems  . Sumatriptan Succinate Other (See Comments)    Other reaction(s): delirium and hallucinations per [facility] records  . Dilaudid [Hydromorphone Hcl] Other (See Comments)    Delirium   . Ondansetron Other (See Comments)    hallucinations    . Zofran [Ondansetron Hcl] Other (See Comments)    Reaction:  hallucinations   . Codeine Nausea And Vomiting  . Gabapentin Other (See Comments)    Reaction:  Unknown    . Lac Bovis Nausea And Vomiting  . Losartan Nausea Only  . Oxycodone Anxiety  . Prochlorperazine Other (See Comments)    Reaction:  Unknown . Patient does not remember reaction but she does have vertigo and anxiety along with n and v at times. Could be used to treat any of these   . Reglan [Metoclopramide] Other (See Comments)    Per patient her Dr. Evelina Bucy her off it   . Scopolamine Other (See Comments)    Dizziness, also has vertigo already  . Tape Rash  . Tapentadol Rash    Family  History: Family History  Problem Relation Age of Onset  . Kidney disease Mother   . Diabetes Mother   . Cancer Father   . Kidney disease Sister     Social History:  reports that she quit smoking about 4 years ago. Her smoking use included cigarettes. She has a 20.00 pack-year smoking history. She has never used smokeless tobacco. She reports previous alcohol use. She reports current drug use. Drug: Marijuana.  ROS: UROLOGY Frequent Urination?: No Hard to postpone urination?: No Burning/pain with urination?: Yes Get up at night to urinate?: No Leakage of urine?: No Urine stream starts and stops?: No Trouble starting stream?: No Do you have to strain to urinate?: Yes Blood in urine?: No Urinary tract infection?: No Sexually transmitted disease?: No Injury to kidneys or bladder?: No Painful intercourse?: No Weak stream?: No Currently pregnant?: No Vaginal bleeding?: No Last menstrual period?: n  Gastrointestinal Nausea?: No Vomiting?: No Indigestion/heartburn?: No Diarrhea?: No Constipation?: No  Constitutional Fever: No Night sweats?: No Weight loss?: No Fatigue?: No  Skin Skin rash/lesions?: No Itching?: No  Eyes Blurred vision?: No Double vision?: No  Ears/Nose/Throat Sore throat?: No Sinus problems?: No  Hematologic/Lymphatic Swollen glands?: No Easy bruising?: No  Cardiovascular Leg swelling?: No Chest pain?: No  Respiratory Cough?: No Shortness of breath?: Yes  Endocrine Excessive thirst?: No  Musculoskeletal Back pain?: No Joint pain?: No  Neurological Headaches?: Yes Dizziness?: Yes  Psychologic Depression?: Yes Anxiety?: Yes  Physical Exam: BP (!) 166/73   Pulse (!) 59   Ht 5' 5.5" (1.664 m)   Wt 189 lb (85.7 kg)   BMI 30.97 kg/m   Constitutional:  Alert and oriented, No acute distress.  Ambulating with cane.  Appears spry today. HEENT: [place] AT,  moist mucus membranes.  Trachea midline, no masses. Cardiovascular: No  clubbing, cyanosis, or edema. Respiratory: Normal respiratory effort, no increased work of breathing. Skin: No rashes, bruises or suspicious lesions. Neurologic: Grossly intact, no focal deficits, moving all 4 extremities. Psychiatric: Normal mood and affect.  Laboratory Data: Lab Results  Component Value Date   WBC 5.4 03/29/2019   HGB 10.9 (L) 03/29/2019   HCT 33.6 (L) 03/29/2019   MCV 93.1 03/29/2019   PLT 197 03/29/2019    Lab Results  Component Value Date   CREATININE 7.48 (H) 10/11/2018    Lab Results  Component Value Date   HGBA1C 7.8 (H) 10/07/2018    Pertinent Imaging: Results for orders placed during the hospital encounter of 07/07/19  US RENAL   Narrative CLINICAL DATA:  Renal mass.  EXAM: RENAL / URINARY TRACT ULTRASOUND COMPLETE  COMPARISON:  CT 10/02/2018, 03/02/2018.  Ultrasound 10/23/2017  FINDINGS: Right Kidney:  Renal measurements: 8.9 x 2.7 x 3.1 cm = volume: 38.1 mL. Increased echogenicity consistent chronic renal disease. Severe cortical thinning consistent with atrophy. No hydronephrosis. A 1.1 cm thinly septated cystic mass is noted the right upper renal pole. No definite solid lesions identified. Two other known small right renal masses not identified by ultrasound. Renal MRI can be obtained to further evaluate as needed.  Left Kidney:  Renal measurements: 7.1 x 3.3 x 3.3 cm = volume: 40.3 mL. Increased echogenicity consistent chronic renal disease. Severe cortical thinning consistent with atrophy. No hydronephrosis. No left renal mass.  Bladder:  Appears normal for degree of bladder distention.  IMPRESSION: 1. Bilateral increased echogenicity consistent chronic renal disease. Severe bilateral renal cortical atrophy. No hydronephrosis or bladder distention.  2. 1 cm thinly septated cystic mass is noted the right upper renal pole. No definite solid lesions identified. Two other known small right renal mass is not identified by  ultrasound. Renal MRI can be obtained to further evaluate as needed.   Electronically Signed   By: Marcello Moores  Register   On: 07/08/2019 07:19    Renal ultrasound was personally reviewed today.  This is compared to previous CT abdomen with and without contrast on 12/2018.  Unfortunately, lesions are not able to be well identified.  Assessment & Plan:    1. Renal mass Small right-sided renal mass with slow interval growth  Unfortunately, unable to place IV for further CT scans with and without.  Lesions not easily visible on renal ultrasound thus will not build to follow this modality.  I recommended pursuing MR without contrast for further surveillance/assessment.  At this point, she is feeling more clinically well than previously and somewhat anxious about these lesions.  She would like to consider definitive intervention for them at this time.  She strongly against surgery as she is concerned about her ability to recover and pain from the procedure.  She may be interested in percutaneous intervention if she is deemed a reasonable candidate.  We will plan to get the MRI and refer to interventional radiology to discuss the option of cryoablation x2 for these lesions.  Did briefly discuss the risk and benefits of this treatment modality for which she would likely be a reasonable candidate.  We will plan to follow-up after she is seen in consultation.  All questions answered.  - MR Abdomen Wo Contrast; Future    Return in about 1 month (around 08/14/2019) for MRI, referral to IR for possible cryoablation.  Hollice Espy, MD  [facility] [address],  [place], [place] [address] [phone]  I spent 25 min with this patient of which greater than 50% was spent in counseling and coordination of care with the patient.

## 2019-07-28 ENCOUNTER — Encounter: Payer: Self-pay | Admitting: *Deleted

## 2019-07-28 ENCOUNTER — Ambulatory Visit
Admission: RE | Admit: 2019-07-28 | Discharge: 2019-07-28 | Disposition: A | Discharge status: Home / Self Care | Payer: Medicare Other | Source: Ambulatory Visit | Attending: Urology | Admitting: Urology

## 2019-07-28 ENCOUNTER — Other Ambulatory Visit: Payer: Self-pay | Admitting: Interventional Radiology

## 2019-07-28 ENCOUNTER — Other Ambulatory Visit: Payer: Self-pay

## 2019-07-28 DIAGNOSIS — N2889 Other specified disorders of kidney and ureter: Secondary | ICD-10-CM

## 2019-07-28 HISTORY — PX: IR RADIOLOGIST EVAL & MGMT: IMG5224

## 2019-07-28 NOTE — Consult Note (Signed)
Chief Complaint: Indeterminate right-sided renal lesions  Referring Physician(s): Brandon,Ashley  History of Present Illness: [name] is a 58 y.o. female with multiple medical comorbidities including chronic diastolic CHF, COPD, CAD (post coronary stent placement), diabetes, end-stage renal disease, on dialysis Monday, Wednesday and Friday, hypertension and hyperlipidemia who is seen today in consultation via telemedicine for potential percutaneous management of indeterminate enlarging right-sided renal lesions.  Patient remains asymptomatic in regards to her right-sided renal lesions.  She admits to baseline level of depression associated with her dialysis as well as feeling overwhelmed with the number of doctors appointments she has to maintain.  The patient would like to undergo treatment of the right-sided renal lesions if able, though does not wish to pursue surgical intervention.  Patient is scheduled to go undergo a presumably noncontrast (given history of end-stage renal disease on dialysis) abdominal MRI on November 19.  Past Medical History:  Diagnosis Date   Anemia    Anginal pain (HCC)    Anxiety    Arthritis    Asthma    Broken wrist    Bronchitis    chronic diastolic CHF 7/40/8144   COPD (chronic obstructive pulmonary disease) ([place])    Coronary artery disease    a. cath 2013: stenting to RCA (report not available); b. cath 2014: LM nl, pLAD 40%, mLAD nl, ost LCx 40%, mid LCx nl, pRCA 30% @ site of prior stent, mRCA 50%   Depression    Diabetes mellitus without complication ([place])    Diabetic neuropathy ([place])    dialysis 2006   Diverticulosis    Dizziness    Dyspnea    Elevated lipids    Environmental and seasonal allergies    ESRD (end stage renal disease) on dialysis ([place])    M-W-F   GERD (gastroesophageal reflux disease)    Headache    History of hiatal hernia    HOH (hard of hearing)    Hx of pancreatitis 2015    Hypertension    Lower extremity edema    Mitral regurgitation    a. echo 10/2013: EF 62%, noWMA, mildly dilated LA, mild to mod MR/TR, GR1DD   Myocardial infarction ([place])    Orthopnea    Pneumonia    Renal cancer ([place])    Renal insufficiency    Pt is on dialysis on M,W + F.   Wheezing     Past Surgical History:  Procedure Laterality Date   A/V SHUNTOGRAM Left 01/20/2018   Procedure: A/V SHUNTOGRAM;  Surgeon: Algernon Huxley, MD;  Location: [facility];  Service: Cardiovascular;  Laterality: Left;   ABDOMINAL HYSTERECTOMY  1992   AMPUTATION TOE Left 10/02/2017   Procedure: AMPUTATION TOE-LEFT GREAT TOE;  Surgeon: Albertine Patricia, DPM;  Location: [facility];  Service: Podiatry;  Laterality: Left;   APPENDECTOMY     ARTERY BIOPSY Right 10/11/2018   Procedure: BIOPSY TEMPORAL ARTERY;  Surgeon: Vickie Epley, MD;  Location: [facility];  Service: General;  Laterality: Right;   CARDIAC CATHETERIZATION Left 07/26/2015   Procedure: Left Heart Cath and Coronary Angiography;  Surgeon: Dionisio David, MD;  Location: [facility];  Service: Cardiovascular;  Laterality: Left;   CATARACT EXTRACTION W/ INTRAOCULAR LENS IMPLANT Right    CATARACT EXTRACTION W/PHACO Left 03/10/2017   Procedure: CATARACT EXTRACTION PHACO AND INTRAOCULAR LENS PLACEMENT ([place]);  Surgeon: Birder Robson, MD;  Location: [facility];  Service: Ophthalmology;  Laterality: Left;  [place] 00:51.9 AP% 14.2 CDE 7.39 fluid  pack lot # [id number]   CHOLECYSTECTOMY     COLONOSCOPY WITH PROPOFOL N/A 08/12/2016   Procedure: COLONOSCOPY WITH PROPOFOL;  Surgeon: Lollie Sails, MD;  Location: [facility];  Service: Endoscopy;  Laterality: N/A;   DIALYSIS FISTULA CREATION Left    upper arm   dialysis grafts     ESOPHAGOGASTRODUODENOSCOPY N/A 03/08/2015   Procedure: ESOPHAGOGASTRODUODENOSCOPY (EGD);  Surgeon: Manya Silvas, MD;  Location: [facility] ENDOSCOPY;  Service: Endoscopy;  Laterality: N/A;    ESOPHAGOGASTRODUODENOSCOPY (EGD) WITH PROPOFOL N/A 03/18/2016   Procedure: ESOPHAGOGASTRODUODENOSCOPY (EGD) WITH PROPOFOL;  Surgeon: [name], MD;  Location: [facility] ENDOSCOPY;  Service: Endoscopy;  Laterality: N/A;   EYE SURGERY Right 2018   FECAL TRANSPLANT N/A 08/23/2015   Procedure: FECAL TRANSPLANT;  Surgeon: Manya Silvas, MD;  Location: [facility];  Service: Endoscopy;  Laterality: N/A;   HAND SURGERY Bilateral    PERIPHERAL VASCULAR CATHETERIZATION N/A 12/20/2015   Procedure: Thrombectomy of dialysis access versus permcath placement;  Surgeon: Algernon Huxley, MD;  Location: [place] [facility];  Service: Cardiovascular;  Laterality: N/A;   PERIPHERAL VASCULAR CATHETERIZATION N/A 12/20/2015   Procedure: A/V Shunt Intervention;  Surgeon: Algernon Huxley, MD;  Location: [place] [facility];  Service: Cardiovascular;  Laterality: N/A;   PERIPHERAL VASCULAR CATHETERIZATION N/A 12/20/2015   Procedure: A/V Shuntogram/Fistulagram;  Surgeon: Algernon Huxley, MD;  Location: [place] [facility];  Service: Cardiovascular;  Laterality: N/A;   PERIPHERAL VASCULAR CATHETERIZATION N/A 01/02/2016   Procedure: A/V Shuntogram/Fistulagram;  Surgeon: Algernon Huxley, MD;  Location: [place] [facility];  Service: Cardiovascular;  Laterality: N/A;   PERIPHERAL VASCULAR CATHETERIZATION N/A 01/02/2016   Procedure: A/V Shunt Intervention;  Surgeon: Algernon Huxley, MD;  Location: [place] [facility];  Service: Cardiovascular;  Laterality: N/A;    Allergies: Ace inhibitors, Ativan [lorazepam], Compazine [prochlorperazine edisylate], Sumatriptan succinate, Dilaudid [hydromorphone hcl], Ondansetron, Zofran [ondansetron hcl], Codeine, Gabapentin, [place], Losartan, Oxycodone, Prochlorperazine, Reglan [metoclopramide], Scopolamine, Tape, and Tapentadol  Medications: Prior to Admission medications   Medication Sig Start Date End Date Taking? Authorizing Provider  albuterol (PROVENTIL HFA;VENTOLIN HFA) 108 (90 Base)  MCG/ACT inhaler Inhale 2 puffs into the lungs every 6 (six) hours as needed for wheezing or shortness of breath. 02/17/18   Merlyn Lot, MD  albuterol (PROVENTIL) (2.5 MG/3ML) 0.083% nebulizer solution Take 2.5 mg by nebulization every 4 (four) hours as needed for wheezing or shortness of breath.    [provider]  amLODipine (NORVASC) 10 MG tablet Take 10 mg by mouth daily.    [provider]  aspirin EC 81 MG tablet Take 81 mg by mouth daily. 09/08/16   [provider]  atorvastatin (LIPITOR) 20 MG tablet Take 20 mg by mouth every evening.  12/02/16   [provider]  Biotin 1 MG CAPS Take 1 mg by mouth daily.    [provider]  carvedilol (COREG) 3.125 MG tablet Take 1 tablet (3.125 mg total) by mouth 2 (two) times daily with a meal. 10/11/18   Epifanio Lesches, MD  cetirizine (ZYRTEC) 10 MG tablet Take 10 mg by mouth daily.     [provider]  chlorpheniramine-HYDROcodone (TUSSIONEX) 10-8 MG/5ML SUER Take 5 mLs by mouth every 12 (twelve) hours. 10/11/18   Epifanio Lesches, MD  cholecalciferol (VITAMIN D) 400 units TABS tablet Take 400 Units by mouth daily.    [provider]  clopidogrel (PLAVIX) 75 MG tablet Take 75 mg by mouth daily. 07/01/19   [provider]  cyanocobalamin 1000 MCG tablet Take 1,000 mcg by mouth daily.     [provider]  cyclobenzaprine (FLEXERIL) 10 MG tablet Take 1 tablet (10 mg total) by mouth 3 (three) times daily as needed. 11/22/18   Sable Feil, PA-C  diclofenac sodium (VOLTAREN) 1 % GEL Apply 1 application topically daily as needed (for rash).    [provider]  ferrous sulfate ([facility] IRON) 325 (65 FE) MG tablet Take 325 mg by mouth daily with breakfast.    [provider]  FLUoxetine (PROZAC) 20 MG capsule Take 1 capsule (20 mg total) by mouth daily. Patient taking differently: Take 20 mg by mouth at bedtime.  04/10/16   Hower, Aaron Mose, MD  furosemide  (LASIX) 80 MG tablet Take 80 mg by mouth daily. 07/09/19   [provider]  hydrocortisone cream 0.5 % Apply topically 3 (three) times daily. 01/21/18   Wieting, Richard, MD  insulin aspart (NOVOLOG FLEXPEN) 100 UNIT/ML FlexPen Inject 4-15 Units into the skin 4 (four) times daily as needed for high blood sugar (> 100, per sliding scale).    [provider]  lipase/protease/amylase (CREON) 12000 UNITS CPEP capsule Take 36000 mg in the morning, 24000 midday & 36000 in the evening    [provider]  LORazepam (ATIVAN PO) Take by mouth.    [provider]  multivitamin (RENA-VIT) TABS tablet Take 1 tablet by mouth daily.     [provider]  nitroGLYCERIN (NITROSTAT) 0.4 MG SL tablet Place 0.4 mg under the tongue every 5 (five) minutes as needed for chest pain.     [provider]  predniSONE (DELTASONE) 10 MG tablet Take 6 tablets (60 mg total) by mouth daily with breakfast. Need to follow up with pcp in 3-5 days regarding biopsy results from temporal artery and decide wether to continue high dose prednsione or not 10/11/18   Epifanio Lesches, MD  ranitidine (ZANTAC) 300 MG capsule Take 1 capsule by mouth every evening. 01/19/18   [provider]  sevelamer carbonate (RENVELA) 800 MG tablet Take 800-2,400 mg by mouth See admin instructions. Take 2400 mg by mouth 3 times daily with meals and take 800 mg by mouth with snacks    [provider]     Family History  Problem Relation Age of Onset   Kidney disease Mother    Diabetes Mother    Cancer Father    Kidney disease Sister     Social History   Socioeconomic History   Marital status: Divorced    Spouse name: Not on file   Number of children: Not on file   Years of education: Not on file   Highest education level: Not on file  Occupational History   Not on file  Social Needs   Financial resource strain: Not hard at all   Food insecurity    Worry: Never  true    Inability: Never true   Transportation needs    Medical: No    Non-medical: No  Tobacco Use   Smoking status: Former Smoker    Packs/day: 0.50    Years: 40.00    Pack years: 20.00    Types: Cigarettes    Quit date: 02/13/2015    Years since quitting: 4.4   Smokeless tobacco: Never Used  Substance and Sexual Activity   Alcohol use: Not Currently    Comment: glass wine week per pt   Drug use: Yes    Types: Marijuana  Comment: once a day   Sexual activity: Never  Lifestyle   Physical activity    Days per week: 7 days    Minutes per session: 10 min   Stress: Very much  Relationships   Social connections    Talks on phone: More than three times a week    Gets together: More than three times a week    Attends religious service: More than 4 times per year    Active member of club or organization: Yes    Attends meetings of clubs or organizations: Never    Relationship status: Divorced  Other Topics Concern   Not on file  Social History Narrative   Not on file    ECOG Status: 2 - Symptomatic, <50% confined to bed  Review of Systems: A 12 point ROS discussed and pertinent positives are indicated in the HPI above.  All other systems are negative.  Review of Systems  Vital Signs: There were no vitals taken for this visit.  Physical Exam  Mallampati Score:     Imaging:  CT abdomen pelvis - 12/07/2018 - there is an approximately 1.2 x 1.1 x 1.2 cm hyperenhancing lesion involving the posterior superior pole of the right kidney as well as an approximately 1.3 x 1.3 x 1.0 cm lesion involving the superior medial aspect of the right kidney, both of which appear grossly unchanged compared to remote abdominal CT performed 05/2017, previously, 1.4 x 1.2 x 1.2 cm and 1.2 x 1.0 x 1.0 cm, respectively, though note, evaluation somewhat degraded on the previous examination secondary to phase of enhancement and patient's baseline renal insufficiency/end-stage renal  disease.  US Renal  Result Date: 07/08/2019 CLINICAL DATA:  Renal mass. EXAM: RENAL / URINARY TRACT ULTRASOUND COMPLETE COMPARISON:  CT 10/02/2018, 03/02/2018.  Ultrasound 10/23/2017 FINDINGS: Right Kidney: Renal measurements: 8.9 x 2.7 x 3.1 cm = volume: 38.1 mL. Increased echogenicity consistent chronic renal disease. Severe cortical thinning consistent with atrophy. No hydronephrosis. A 1.1 cm thinly septated cystic mass is noted the right upper renal pole. No definite solid lesions identified. Two other known small right renal masses not identified by ultrasound. Renal MRI can be obtained to further evaluate as needed. Left Kidney: Renal measurements: 7.1 x 3.3 x 3.3 cm = volume: 40.3 mL. Increased echogenicity consistent chronic renal disease. Severe cortical thinning consistent with atrophy. No hydronephrosis. No left renal mass. Bladder: Appears normal for degree of bladder distention. IMPRESSION: 1. Bilateral increased echogenicity consistent chronic renal disease. Severe bilateral renal cortical atrophy. No hydronephrosis or bladder distention. 2. 1 cm thinly septated cystic mass is noted the right upper renal pole. No definite solid lesions identified. Two other known small right renal mass is not identified by ultrasound. Renal MRI can be obtained to further evaluate as needed. Electronically Signed   By: Marcello Moores  Register   On: 07/08/2019 07:19    Labs:  CBC: Recent Labs    10/05/18 0641 10/10/18 0529 12/28/18 0851 03/29/19 0923  WBC 5.8 6.9 5.0 5.4  HGB 9.5* 9.7* 12.3 10.9*  HCT 28.8* 31.8* 39.1 33.6*  PLT 198 259 213 197    COAGS: No results for input(s): INR, APTT in the last 8760 hours.  BMP: Recent Labs    10/04/18 1140 10/05/18 0641 10/07/18 0753 10/11/18 0754  NA 135 135 129* 128*  K 4.6 5.1 4.7 4.6  CL 98 102 89* 92*  CO2 23 21* 27 25  GLUCOSE 159* 97 499* 218*  BUN 38* 41* 19 72*  CALCIUM 8.1* 7.8* 8.1* 8.1*  CREATININE 9.04* 10.03* 4.68* 7.48*  GFRNONAA  4* 4* 10* 5*  GFRAA 5* 4* 11* 6*    LIVER FUNCTION TESTS: Recent Labs    10/02/18 1151 10/04/18 1140  BILITOT 0.8 0.9  AST 24 25  ALT 15 15  ALKPHOS 136* 112  PROT 8.3* 7.5  ALBUMIN 4.0 3.6    TUMOR MARKERS: No results for input(s): AFPTM, CEA, CA199, CHROMGRNA in the last 8760 hours.  Assessment and Plan:  HAVAH AMMON is a 58 y.o. female with multiple medical comorbidities including chronic diastolic CHF, COPD, CAD (post coronary stent placement), diabetes, end-stage renal disease, on dialysis Monday, Wednesday and Friday, hypertension and hyperlipidemia who is seen today in consultation via telemedicine for potential percutaneous management of indeterminate enlarging right-sided renal lesions.  Patient remains asymptomatic in regards to her right-sided renal lesions.    Both hyperenhancing right-sided renal lesions appear grossly unchanged when comparing to the most recent abdominal CT performed 12/07/18 to prior contrast enhanced CT performed 06/09/2017 with dominant lesion involving the superior medial aspect of the right kidney measuring approximately 1.3 x 1.3 x 1.0 cm, previously, 1.2 x 1.0 x 1.0 cm and the additional lesion involving the posterior superior pole of the right kidney measuring approximately 1.2 x 1.1 x 1.2 cm, previously, 1.4 x 1.2 x 1.2 cm.  Despite the small size and relative stability of the right sided renal lesions, the patient is interested in undergoing potential treatment, if able, as she is feeling overwhelmed in regards to the number of MD appointments she is required to attend, though again does NOT want surgical resection.  I explained that even following a technically successful cryoablation, the patient would require close follow-up with q 3 month CT or MRI for the first year, followed by q 6 month for the subsequent year and an annual exam the year following.  Realizing this, the patient is still interesting in potentially pursuing cryoablation.  As  such, I feel that it is prudent to formulate a definitive plan of either to pursue cryoablation versus continued surveillance following the patient's abdominal MRI scheduled for later this month.  The patient is in agreement with this plan of care.  PLAN: - Follow-up telemedicine consultation following acquisition of abdominal MRI scheduled for 08/11/19.  Patient knows to call the interventional radiology clinic with any interval questions or concerns.  Thank you for this interesting consult.  I greatly enjoyed meeting TEMEKA PORE and look forward to participating in their care.  A copy of this report was sent to the requesting provider on this date.  Electronically Signed: Sandi Mariscal 07/28/2019, 11:50 AM   I spent a total of 15 Minutes in remote  clinical consultation, greater than 50% of which was counseling/coordinating care for indeterminate right sided renal lesions.    Visit type: Audio only (telephone). Audio (no video) only due to patient's lack of internet/smartphone capability. Alternative for in-person consultation at [facility], [address] [place], [place], [place]. This visit type was conducted due to national recommendations for restrictions regarding the COVID-19 Pandemic (e.g. social distancing).  This format is felt to be most appropriate for this patient at this time.  All issues noted in this document were discussed and addressed.

## 2019-08-11 ENCOUNTER — Other Ambulatory Visit: Payer: Self-pay

## 2019-08-11 ENCOUNTER — Ambulatory Visit
Admission: RE | Admit: 2019-08-11 | Discharge: 2019-08-11 | Disposition: A | Discharge status: Home / Self Care | Payer: Medicare Other | Source: Ambulatory Visit | Attending: Urology | Admitting: Urology

## 2019-08-11 ENCOUNTER — Ambulatory Visit
Admission: RE | Admit: 2019-08-11 | Discharge: 2019-08-11 | Disposition: A | Discharge status: Home / Self Care | Payer: Medicare Other | Source: Ambulatory Visit | Attending: Interventional Radiology | Admitting: Interventional Radiology

## 2019-08-11 DIAGNOSIS — N2889 Other specified disorders of kidney and ureter: Secondary | ICD-10-CM | POA: Diagnosis not present

## 2019-08-11 HISTORY — PX: IR RADIOLOGIST EVAL & MGMT: IMG5224

## 2019-08-11 NOTE — Progress Notes (Signed)
Patient ID: [name], female   DOB: 1961-03-27, 58 y.o.   MRN: [id number]         Chief Complaint: Indeterminate right-sided renal lesion   Referring Physician(s): Erlene Quan [facility])  History of Present Illness: [name] is a 58 y.o. female with multiple medical comorbidities including chronic diastolic CHF, COPD, CAD (post coronary stent placement), diabetes, and end-stage renal disease (on dialysis Monday, Wednesday and Friday), hypertension and hyperlipidemia who was initially seen in consultation for consideration of percutaneous management of indeterminate right-sided renal lesions via telemedicine consultation on 07/28/2019.  Patient is seen in repeat telemedicine consultation today following acquisition of noncontrast abdominal MRI performed 08/11/2019.  Patient remains asymptomatic in regards to her right-sided renal lesions.  She admits to baseline level of depression associated with her dialysis as well as feeling overwhelmed with the number of doctors appointments she has to maintain.   Past Medical History:  Diagnosis Date  . Anemia   . Anginal pain ([place])   . Anxiety   . Arthritis   . Asthma   . Broken wrist   . Bronchitis   . chronic diastolic CHF 01/13/5360  . COPD (chronic obstructive pulmonary disease) ([place])   . Coronary artery disease    a. cath 2013: stenting to RCA (report not available); b. cath 2014: LM nl, pLAD 40%, mLAD nl, ost LCx 40%, mid LCx nl, pRCA 30% @ site of prior stent, mRCA 50%  . Depression   . Diabetes mellitus without complication ([place])   . Diabetic neuropathy ([place])   . dialysis 2006  . Diverticulosis   . Dizziness   . Dyspnea   . Elevated lipids   . Environmental and seasonal allergies   . ESRD (end stage renal disease) on dialysis ([place])    M-W-F  . GERD (gastroesophageal reflux disease)   . Headache   . History of hiatal hernia   . HOH (hard of hearing)   . Hx of pancreatitis 2015  . Hypertension   . Lower extremity edema    . Mitral regurgitation    a. echo 10/2013: EF 62%, noWMA, mildly dilated LA, mild to mod MR/TR, GR1DD  . Myocardial infarction ([place])   . Orthopnea   . Pneumonia   . Renal cancer ([place])   . Renal insufficiency    Pt is on dialysis on M,W + F.  . Wheezing     Past Surgical History:  Procedure Laterality Date  . A/V SHUNTOGRAM Left 01/20/2018   Procedure: A/V SHUNTOGRAM;  Surgeon: Algernon Huxley, MD;  Location: [place] [facility];  Service: Cardiovascular;  Laterality: Left;  . ABDOMINAL HYSTERECTOMY  1992  . AMPUTATION TOE Left 10/02/2017   Procedure: AMPUTATION TOE-LEFT GREAT TOE;  Surgeon: Albertine Patricia, DPM;  Location: [facility] OR[facility];  Service: Podiatry;  Laterality: Left;  . APPENDECTOMY    . ARTERY BIOPSY Right 10/11/2018   Procedure: BIOPSY TEMPORAL ARTERY;  Surgeon: Vickie Epley, MD;  Location: [facility];  Service: General;  Laterality: Right;  . CARDIAC CATHETERIZATION Left 07/26/2015   Procedure: Left Heart Cath and Coronary Angiography;  Surgeon: Dionisio David, MD;  Location: [place] [facility];  Service: Cardiovascular;  Laterality: Left;  . CATARACT EXTRACTION W/ INTRAOCULAR LENS IMPLANT Right   . CATARACT EXTRACTION W/PHACO Left 03/10/2017   Procedure: CATARACT EXTRACTION PHACO AND INTRAOCULAR LENS PLACEMENT (IOC);  Surgeon: Birder Robson, MD;  Location: [facility];  Service: Ophthalmology;  Laterality: Left;  [place] 00:51.9 AP% 14.2 CDE 7.39  fluid pack lot # [id number]  . CHOLECYSTECTOMY    . COLONOSCOPY WITH PROPOFOL N/A 08/12/2016   Procedure: COLONOSCOPY WITH PROPOFOL;  Surgeon: Lollie Sails, MD;  Location: [facility];  Service: Endoscopy;  Laterality: N/A;  . DIALYSIS FISTULA CREATION Left    upper arm  . dialysis grafts    . ESOPHAGOGASTRODUODENOSCOPY N/A 03/08/2015   Procedure: ESOPHAGOGASTRODUODENOSCOPY (EGD);  Surgeon: Manya Silvas, MD;  Location: [facility];  Service: Endoscopy;  Laterality: N/A;  . ESOPHAGOGASTRODUODENOSCOPY (EGD) WITH PROPOFOL N/A  03/18/2016   Procedure: ESOPHAGOGASTRODUODENOSCOPY (EGD) WITH PROPOFOL;  Surgeon: [name], MD;  Location: [facility] ENDOSCOPY;  Service: Endoscopy;  Laterality: N/A;  . EYE SURGERY Right 2018  . FECAL TRANSPLANT N/A 08/23/2015   Procedure: FECAL TRANSPLANT;  Surgeon: Manya Silvas, MD;  Location: [facility];  Service: Endoscopy;  Laterality: N/A;  . HAND SURGERY Bilateral   . IR RADIOLOGIST EVAL & MGMT  07/28/2019  . PERIPHERAL VASCULAR CATHETERIZATION N/A 12/20/2015   Procedure: Thrombectomy of dialysis access versus permcath placement;  Surgeon: Algernon Huxley, MD;  Location: [place] [facility];  Service: Cardiovascular;  Laterality: N/A;  . PERIPHERAL VASCULAR CATHETERIZATION N/A 12/20/2015   Procedure: A/V Shunt Intervention;  Surgeon: Algernon Huxley, MD;  Location: [place] [facility];  Service: Cardiovascular;  Laterality: N/A;  . PERIPHERAL VASCULAR CATHETERIZATION N/A 12/20/2015   Procedure: A/V Shuntogram/Fistulagram;  Surgeon: Algernon Huxley, MD;  Location: [place] [facility];  Service: Cardiovascular;  Laterality: N/A;  . PERIPHERAL VASCULAR CATHETERIZATION N/A 01/02/2016   Procedure: A/V Shuntogram/Fistulagram;  Surgeon: Algernon Huxley, MD;  Location: [place] [facility];  Service: Cardiovascular;  Laterality: N/A;  . PERIPHERAL VASCULAR CATHETERIZATION N/A 01/02/2016   Procedure: A/V Shunt Intervention;  Surgeon: Algernon Huxley, MD;  Location: [place] [facility];  Service: Cardiovascular;  Laterality: N/A;    Allergies: Ace inhibitors, Ativan [lorazepam], Compazine [prochlorperazine edisylate], Sumatriptan succinate, Dilaudid [hydromorphone hcl], Ondansetron, Zofran [ondansetron hcl], Codeine, Gabapentin, Lac bovis, Losartan, Oxycodone, Prochlorperazine, Reglan [metoclopramide], Scopolamine, Tape, and Tapentadol  Medications: Prior to Admission medications   Medication Sig Start Date End Date Taking? Authorizing Provider  albuterol (PROVENTIL HFA;VENTOLIN HFA) 108 (90 Base) MCG/ACT  inhaler Inhale 2 puffs into the lungs every 6 (six) hours as needed for wheezing or shortness of breath. 02/17/18   Merlyn Lot, MD  albuterol (PROVENTIL) (2.5 MG/3ML) 0.083% nebulizer solution Take 2.5 mg by nebulization every 4 (four) hours as needed for wheezing or shortness of breath.    [provider]  amLODipine (NORVASC) 10 MG tablet Take 10 mg by mouth daily.    [provider]  aspirin EC 81 MG tablet Take 81 mg by mouth daily. 09/08/16   [provider]  atorvastatin (LIPITOR) 20 MG tablet Take 20 mg by mouth every evening.  12/02/16   [provider]  Biotin 1 MG CAPS Take 1 mg by mouth daily.    [provider]  carvedilol (COREG) 3.125 MG tablet Take 1 tablet (3.125 mg total) by mouth 2 (two) times daily with a meal. 10/11/18   Epifanio Lesches, MD  cetirizine (ZYRTEC) 10 MG tablet Take 10 mg by mouth daily.     [provider]  chlorpheniramine-HYDROcodone (TUSSIONEX) 10-8 MG/5ML SUER Take 5 mLs by mouth every 12 (twelve) hours. 10/11/18   Epifanio Lesches, MD  cholecalciferol (VITAMIN D) 400 units TABS tablet Take 400 Units by mouth daily.    [provider]  clopidogrel (PLAVIX) 75 MG tablet  Take 75 mg by mouth daily. 07/01/19   [provider]  cyanocobalamin 1000 MCG tablet Take 1,000 mcg by mouth daily.     [provider]  cyclobenzaprine (FLEXERIL) 10 MG tablet Take 1 tablet (10 mg total) by mouth 3 (three) times daily as needed. 11/22/18   Sable Feil, PA-C  diclofenac sodium (VOLTAREN) 1 % GEL Apply 1 application topically daily as needed (for rash).    [provider]  ferrous sulfate ([facility] IRON) 325 (65 FE) MG tablet Take 325 mg by mouth daily with breakfast.    [provider]  FLUoxetine (PROZAC) 20 MG capsule Take 1 capsule (20 mg total) by mouth daily. Patient taking differently: Take 20 mg by mouth at bedtime.  04/10/16   Hower, Aaron Mose, MD  furosemide (LASIX) 80  MG tablet Take 80 mg by mouth daily. 07/09/19   [provider]  hydrocortisone cream 0.5 % Apply topically 3 (three) times daily. 01/21/18   Wieting, Richard, MD  insulin aspart (NOVOLOG FLEXPEN) 100 UNIT/ML FlexPen Inject 4-15 Units into the skin 4 (four) times daily as needed for high blood sugar (> 100, per sliding scale).    [provider]  lipase/protease/amylase (CREON) 12000 UNITS CPEP capsule Take 36000 mg in the morning, 24000 midday & 36000 in the evening    [provider]  LORazepam (ATIVAN PO) Take by mouth.    [provider]  multivitamin (RENA-VIT) TABS tablet Take 1 tablet by mouth daily.     [provider]  nitroGLYCERIN (NITROSTAT) 0.4 MG SL tablet Place 0.4 mg under the tongue every 5 (five) minutes as needed for chest pain.     [provider]  predniSONE (DELTASONE) 10 MG tablet Take 6 tablets (60 mg total) by mouth daily with breakfast. Need to follow up with pcp in 3-5 days regarding biopsy results from temporal artery and decide wether to continue high dose prednsione or not 10/11/18   Epifanio Lesches, MD  ranitidine (ZANTAC) 300 MG capsule Take 1 capsule by mouth every evening. 01/19/18   [provider]  sevelamer carbonate (RENVELA) 800 MG tablet Take 800-2,400 mg by mouth See admin instructions. Take 2400 mg by mouth 3 times daily with meals and take 800 mg by mouth with snacks    [provider]     Family History  Problem Relation Age of Onset  . Kidney disease Mother   . Diabetes Mother   . Cancer Father   . Kidney disease Sister     Social History   Socioeconomic History  . Marital status: Divorced    Spouse name: Not on file  . Number of children: Not on file  . Years of education: Not on file  . Highest education level: Not on file  Occupational History  . Not on file  Social Needs  . Financial resource strain: Not hard at all  . Food insecurity    Worry: Never true     Inability: Never true  . Transportation needs    Medical: No    Non-medical: No  Tobacco Use  . Smoking status: Former Smoker    Packs/day: 0.50    Years: 40.00    Pack years: 20.00    Types: Cigarettes    Quit date: 02/13/2015    Years since quitting: 4.4  . Smokeless tobacco: Never Used  Substance and Sexual Activity  . Alcohol use: Not Currently    Comment: glass wine week per pt  .  Drug use: Yes    Types: Marijuana    Comment: once a day  . Sexual activity: Never  Lifestyle  . Physical activity    Days per week: 7 days    Minutes per session: 10 min  . Stress: Very much  Relationships  . Social connections    Talks on phone: More than three times a week    Gets together: More than three times a week    Attends religious service: More than 4 times per year    Active member of club or organization: Yes    Attends meetings of clubs or organizations: Never    Relationship status: Divorced  Other Topics Concern  . Not on file  Social History Narrative  . Not on file    ECOG Status: 2 - Symptomatic, <50% confined to bed  Review of Systems  Review of Systems: A 12 point ROS discussed and pertinent positives are indicated in the HPI above.  All other systems are negative.  Physical Exam No direct physical exam was performed (except for noted visual exam findings with Video Visits).   Vital Signs: There were no vitals taken for this visit.  Imaging: Mr Abdomen Wo Contrast  Result Date: 08/11/2019 CLINICAL DATA:  History of enhancing right renal masses, presenting for follow-up. Stage renal disease on dialysis. EXAM: MRI ABDOMEN WITHOUT CONTRAST TECHNIQUE: Multiplanar multisequence MR imaging was performed without the administration of intravenous contrast. COMPARISON:  12/07/2018 CT abdomen without and with IV contrast. 07/07/2019 renal sonogram. FINDINGS: Lower chest: Trace dependent right pleural effusion. Hepatobiliary: Normal liver size and configuration. Diffuse  liver hemosiderosis. No liver mass. Cholecystectomy. Bile ducts are stable and within normal post cholecystectomy limits. Common bile duct diameter 8 mm. No evidence of choledocholithiasis. No biliary strictures or beading. Pancreas: No pancreatic mass or duct dilation.  No pancreas divisum. Spleen: Normal size spleen. Diffuse splenic hemosiderosis. No splenic mass. Adrenals/Urinary Tract: Normal adrenals. No hydronephrosis. Symmetric severe bilateral renal atrophy, unchanged. Medial upper right kidney 1.5 x 1.3 cm cortical mass (series 10/image 15) is similar in signal intensity to the renal cortex on T1 and T2 sequences, poorly visualized without IV contrast, previously 1.5 x 1.3 cm on 12/07/2018 CT, not appreciably changed in size. Posterior upper right kidney 1.3 x 1.2 cm cortical mass (series 10/image 16) is similar in signal intensity to the renal cortex on T1 and T2 sequences, poorly visualized without IV contrast, previously 1.3 x 1.2 cm on 12/07/2018 CT, not appreciably changed in size. Previously visualized 0.6 cm enhancing renal cortical lesion in the anterior upper right kidney is occult on today's noncontrast MRI study. Subcentimeter renal cortical cysts scattered in both kidneys. No new suspicious renal masses on this noncontrast study. Stomach/Bowel: Normal non-distended stomach. Visualized small and large bowel is normal caliber, with no bowel wall thickening. Colonic diverticulosis. Vascular/Lymphatic: Normal caliber abdominal aorta. No pathologically enlarged lymph nodes in the abdomen. Other: No abdominal ascites or focal fluid collection. Musculoskeletal: No aggressive appearing focal osseous lesions. IMPRESSION: 1. Known enhancing renal cortical masses in the medial upper right kidney and posterior upper right kidney are stable in size since 12/07/2018 CT. Separate known enhancing subcentimeter renal cortical mass in the anterior upper right kidney is occult on today's study. No appreciable new  suspicious renal masses. 2. No adenopathy or other findings of metastatic disease in the abdomen on this noncontrast study. 3. Trace dependent right pleural effusion. 4. Diffuse hemosiderosis in the liver and spleen, presumably related to prior blood  transfusions. 5. Colonic diverticulosis. Electronically Signed   By: Ilona Sorrel M.D.   On: 08/11/2019 10:04   Ir Radiologist Eval & Mgmt  Result Date: 07/28/2019 Please refer to notes tab for details about interventional procedure. (Op Note)   Labs:  CBC: Recent Labs    10/05/18 0641 10/10/18 0529 12/28/18 0851 03/29/19 0923  WBC 5.8 6.9 5.0 5.4  HGB 9.5* 9.7* 12.3 10.9*  HCT 28.8* 31.8* 39.1 33.6*  PLT 198 259 213 197    COAGS: No results for input(s): INR, APTT in the last 8760 hours.  BMP: Recent Labs    10/04/18 1140 10/05/18 0641 10/07/18 0753 10/11/18 0754  NA 135 135 129* 128*  K 4.6 5.1 4.7 4.6  CL 98 102 89* 92*  CO2 23 21* 27 25  GLUCOSE 159* 97 499* 218*  BUN 38* 41* 19 72*  CALCIUM 8.1* 7.8* 8.1* 8.1*  CREATININE 9.04* 10.03* 4.68* 7.48*  GFRNONAA 4* 4* 10* 5*  GFRAA 5* 4* 11* 6*    LIVER FUNCTION TESTS: Recent Labs    10/02/18 1151 10/04/18 1140  BILITOT 0.8 0.9  AST 24 25  ALT 15 15  ALKPHOS 136* 112  PROT 8.3* 7.5  ALBUMIN 4.0 3.6    TUMOR MARKERS: No results for input(s): AFPTM, CEA, CA199, CHROMGRNA in the last 8760 hours.  Assessment and Plan:  MASSA PE is a 58 y.o. female with multiple medical comorbidities including chronic diastolic CHF, COPD, CAD (post coronary stent placement), diabetes and end-stage renal disease (on dialysis Monday, Wednesday and Friday), hypertension and hyperlipidemia who is seen in repeat telemedicine consultation today following acquisition of noncontrast abdominal MRI performed 08/11/2019.  Patient remains asymptomatic in regards to her right-sided renal lesions.   The abdominal MRI is limited due to lack of intravenous contrast (patient has history of  end-stage renal disease, on dialysis and thus is not a candidate for MRI contrast), however the MRI demonstrates relative stability of both dominant hyperenhancing right-sided renal lesions as follows:  Right superior medial lesion - 1.5 x 1.0 cm, previously 1.4 x 1.2 cm and 1.6 x 1.2 (on the 3/17 and 9/18 examinations respectively). Right posterior superior pole - 1.3 x 1.1 cm, previously, 1.2 x 1.1 cm and 1.4 x 1.1 cm (on the 3/17 and 9/18 examinations respectively).  Given size and location of these lesions, the patient is technically a candidate for image guided renal cryoablation though relative stability of these lesions since 9/18 would suggest a very indolent process.  As such, prolonged conversations were held with the patient regarding proceeding with image guided cryoablation versus continued surveillance.  Given her multiple medical co-morbidities as well as our current healthcare environment with a surge of new COVID-19 infections, I encouraged that patient to pursue surveillance.  Following this detailed conversation the patient elected to pursue surveillance.  As such we will obtain a multi phase renal protocol CT in 6 months (mid May).  Additionally, we will arrange for the patient to undergo an US guided temporary IV placement by IR prior to the CT given her history of ESRD and poor venous access.  Patient is in agreement with the above plan of care and knows to call the IR clinic with any interval questions or concerns.  PLAN: - Renal protocol CT in 6 months (mid May 2021) with US guided temporary IV to be placed by IR prior to the CT.  A copy of this report was sent to the requesting provider on this date.  Electronically Signed: Sandi Mariscal 08/11/2019, 3:07 PM   I spent a total of f15 Minutes in remote  clinical consultation, greater than 50% of which was counseling/coordinating care for right sided renal lesions.    Visit type: Audio only (telephone). Audio (no video)  only due to patient's lack of internet/smartphone capability. Alternative for in-person consultation at [facility], [place] [address], [place], [place]. This visit type was conducted due to national recommendations for restrictions regarding the COVID-19 Pandemic (e.g. social distancing).  This format is felt to be most appropriate for this patient at this time.  All issues noted in this document were discussed and addressed.

## 2019-08-16 ENCOUNTER — Ambulatory Visit: Payer: Medicare Other | Admitting: Urology

## 2019-08-24 ENCOUNTER — Other Ambulatory Visit: Payer: Self-pay

## 2019-08-24 ENCOUNTER — Emergency Department: Payer: Medicare Other

## 2019-08-24 ENCOUNTER — Emergency Department
Admission: EM | Admit: 2019-08-24 | Discharge: 2019-08-24 | Disposition: A | Discharge status: Home / Self Care | Payer: Medicare Other | Attending: Student | Admitting: Student

## 2019-08-24 DIAGNOSIS — N186 End stage renal disease: Secondary | ICD-10-CM | POA: Insufficient documentation

## 2019-08-24 DIAGNOSIS — I2510 Atherosclerotic heart disease of native coronary artery without angina pectoris: Secondary | ICD-10-CM | POA: Insufficient documentation

## 2019-08-24 DIAGNOSIS — Z7982 Long term (current) use of aspirin: Secondary | ICD-10-CM | POA: Diagnosis not present

## 2019-08-24 DIAGNOSIS — Z79899 Other long term (current) drug therapy: Secondary | ICD-10-CM | POA: Insufficient documentation

## 2019-08-24 DIAGNOSIS — R1011 Right upper quadrant pain: Secondary | ICD-10-CM | POA: Diagnosis not present

## 2019-08-24 DIAGNOSIS — Z992 Dependence on renal dialysis: Secondary | ICD-10-CM | POA: Diagnosis not present

## 2019-08-24 DIAGNOSIS — E1122 Type 2 diabetes mellitus with diabetic chronic kidney disease: Secondary | ICD-10-CM | POA: Diagnosis not present

## 2019-08-24 DIAGNOSIS — I132 Hypertensive heart and chronic kidney disease with heart failure and with stage 5 chronic kidney disease, or end stage renal disease: Secondary | ICD-10-CM | POA: Insufficient documentation

## 2019-08-24 DIAGNOSIS — Z89412 Acquired absence of left great toe: Secondary | ICD-10-CM | POA: Insufficient documentation

## 2019-08-24 DIAGNOSIS — Z87891 Personal history of nicotine dependence: Secondary | ICD-10-CM | POA: Diagnosis not present

## 2019-08-24 DIAGNOSIS — J45909 Unspecified asthma, uncomplicated: Secondary | ICD-10-CM | POA: Diagnosis not present

## 2019-08-24 DIAGNOSIS — Z7902 Long term (current) use of antithrombotics/antiplatelets: Secondary | ICD-10-CM | POA: Insufficient documentation

## 2019-08-24 DIAGNOSIS — Z794 Long term (current) use of insulin: Secondary | ICD-10-CM | POA: Diagnosis not present

## 2019-08-24 DIAGNOSIS — I5032 Chronic diastolic (congestive) heart failure: Secondary | ICD-10-CM | POA: Diagnosis not present

## 2019-08-24 DIAGNOSIS — R109 Unspecified abdominal pain: Secondary | ICD-10-CM

## 2019-08-24 LAB — COMPREHENSIVE METABOLIC PANEL
ALT: 17 U/L (ref 0–44)
AST: 24 U/L (ref 15–41)
Albumin: 3.8 g/dL (ref 3.5–5.0)
Alkaline Phosphatase: 82 U/L (ref 38–126)
Anion gap: 13 (ref 5–15)
BUN: 67 mg/dL — ABNORMAL HIGH (ref 6–20)
CO2: 23 mmol/L (ref 22–32)
Calcium: 8.8 mg/dL — ABNORMAL LOW (ref 8.9–10.3)
Chloride: 95 mmol/L — ABNORMAL LOW (ref 98–111)
Creatinine, Ser: 10.05 mg/dL — ABNORMAL HIGH (ref 0.44–1.00)
GFR calc Af Amer: 4 mL/min — ABNORMAL LOW (ref >60)
GFR calc non Af Amer: 4 mL/min — ABNORMAL LOW (ref >60)
Glucose, Bld: 90 mg/dL (ref 70–99)
Potassium: 5.6 mmol/L — ABNORMAL HIGH (ref 3.5–5.1)
Sodium: 131 mmol/L — ABNORMAL LOW (ref 135–145)
Total Bilirubin: 0.7 mg/dL (ref 0.3–1.2)
Total Protein: 7.1 g/dL (ref 6.5–8.1)

## 2019-08-24 LAB — CBC
HCT: 26.4 % — ABNORMAL LOW (ref 36.0–46.0)
Hemoglobin: 8.6 g/dL — ABNORMAL LOW (ref 12.0–15.0)
MCH: 29.2 pg (ref 26.0–34.0)
MCHC: 32.6 g/dL (ref 30.0–36.0)
MCV: 89.5 fL (ref 80.0–100.0)
Platelets: 120 10*3/uL — ABNORMAL LOW (ref 150–400)
RBC: 2.95 MIL/uL — ABNORMAL LOW (ref 3.87–5.11)
RDW: 12.8 % (ref 11.5–15.5)
WBC: 6.6 10*3/uL (ref 4.0–10.5)
nRBC: 0 % (ref 0.0–0.2)

## 2019-08-24 LAB — LIPASE, BLOOD: Lipase: 39 U/L (ref 11–51)

## 2019-08-24 MED ORDER — SODIUM CHLORIDE 0.9% FLUSH: 3.0000 mL | Freq: Once | INTRAVENOUS | Status: DC

## 2019-08-24 MED ORDER — HYDROCODONE-ACETAMINOPHEN 5-325 MG PO TABS
1.0000 | ORAL_TABLET | Freq: Once | ORAL | Status: AC
  Administered 2019-08-24: 1 via ORAL
  Filled 2019-08-24: qty 1

## 2019-08-24 MED ORDER — FENTANYL CITRATE (PF) 100 MCG/2ML IJ SOLN
50.0000 ug | Freq: Once | INTRAMUSCULAR | Status: DC
  Filled 2019-08-24: qty 2

## 2019-08-24 NOTE — ED Provider Notes (Addendum)
[facility] Emergency Department Provider Note  ____________________________________________   First MD Initiated Contact with Patient 08/24/19 1502     (approximate)  I have reviewed the triage vital signs and the nursing notes.  History  Chief Complaint Abdominal Pain    HPI [name] is a 58 y.o. female with hx of ESRD (MWF), s/p chole, PMHx as below, who presents for right sided abdominal pain.  Patient states she woke up this morning and was feeling well.  She went to dialysis, and while in the waiting room she started to develop right-sided abdominal pain.  Sharp/cramping.  10/10 in severity at the time. No radiation. No alleviating/aggravating factors.  She attempted to use the restroom, and had a bowel movement x 2 without significant relief.  She denies any associated fevers, nausea, vomiting, diarrhea.  Because of her pain, her dialysis center called EMS.  She did not undergo a dialysis session today, but does have one scheduled for tomorrow.   Past Medical Hx Past Medical History:  Diagnosis Date  . Anemia   . Anginal pain ([place])   . Anxiety   . Arthritis   . Asthma   . Broken wrist   . Bronchitis   . chronic diastolic CHF 05/24/4096  . COPD (chronic obstructive pulmonary disease) ([place])   . Coronary artery disease    a. cath 2013: stenting to RCA (report not available); b. cath 2014: LM nl, pLAD 40%, mLAD nl, ost LCx 40%, mid LCx nl, pRCA 30% @ site of prior stent, mRCA 50%  . Depression   . Diabetes mellitus without complication ([place])   . Diabetic neuropathy ([place])   . dialysis 2006  . Diverticulosis   . Dizziness   . Dyspnea   . Elevated lipids   . Environmental and seasonal allergies   . ESRD (end stage renal disease) on dialysis ([place])    M-W-F  . GERD (gastroesophageal reflux disease)   . Headache   . History of hiatal hernia   . HOH (hard of hearing)   . Hx of pancreatitis 2015  . Hypertension   . Lower extremity edema   .  Mitral regurgitation    a. echo 10/2013: EF 62%, noWMA, mildly dilated LA, mild to mod MR/TR, GR1DD  . Myocardial infarction ([place])   . Orthopnea   . Pneumonia   . Renal cancer ([place])   . Renal insufficiency    Pt is on dialysis on M,W + F.  . Wheezing     Problem List Patient Active Problem List   Diagnosis Date Noted  . Atherosclerosis of native arteries of extremity with intermittent claudication ([place]) 05/01/2019  . Right-sided headache   . COPD (chronic obstructive pulmonary disease) ([place]) 10/06/2018  . Syncope 10/04/2018  . Symptomatic anemia 06/18/2018  . Gastroparesis due to DM ([place]) 01/18/2018  . Complication of vascular access for dialysis 12/04/2017  . Osteomyelitis ([place]) 09/30/2017  . Carotid stenosis 06/18/2017  . Shortness of breath 05/04/2017  . Cellulitis of lower extremity 07/29/2016  . Chronic venous insufficiency 07/29/2016  . Lymphedema 07/29/2016  . TIA (transient ischemic attack) 04/21/2016  . Altered mental status 04/08/2016  . Hyperammonemia ([place]) 04/08/2016  . Elevated troponin 04/08/2016  . Depression 04/08/2016  . Depression, major, recurrent, severe with psychosis ([place]) 04/08/2016  . Blood in stool   . Intractable cyclical vomiting with nausea   . Reflux esophagitis   . Gastritis   . Generalized abdominal pain   . Uncontrollable vomiting   .  Major depressive disorder, recurrent episode, moderate ([place]) 03/15/2016  . Adjustment disorder with mixed anxiety and depressed mood 03/15/2016  . Malnutrition of moderate degree 12/01/2015  . Renal mass   . Dyspnea   . Acute renal failure ([place])   . Respiratory failure ([place])   . High temperature 11/14/2015  . Pulmonary edema   . Encounter for central line placement   . Encounter for orogastric (OG) tube placement   . Nausea 11/12/2015  . Hyperkalemia 10/03/2015  . Diarrhea, unspecified 07/22/2015  . Pneumonia 05/21/2015  . Hypoglycemia 04/24/2015  . Unresponsiveness 04/24/2015  . Bradycardia  04/24/2015  . Hypothermia 04/24/2015  . Acute respiratory failure ([place]) 04/24/2015  . Acute diastolic CHF (congestive heart failure) ([place]) 04/05/2015  . Diabetic gastroparesis ([place]) 04/05/2015  . Hypokalemia 04/05/2015  . Generalized weakness 04/05/2015  . Acute pulmonary edema ([place]) 04/03/2015  . Nausea and vomiting 04/03/2015  . Hypoglycemia associated with diabetes ([place]) 04/03/2015  . Anemia of chronic disease 04/03/2015  . Secondary hyperparathyroidism ([place]) 04/03/2015  . Pressure ulcer 04/02/2015  . Acute respiratory failure with hypoxia ([place]) 04/01/2015  . Adjustment disorder with anxiety 03/14/2015  . Somatic symptom disorder, mild 03/08/2015  . Coronary artery disease involving native coronary artery of native heart without angina pectoris   . Nausea & vomiting 03/06/2015  . Abdominal pain 03/06/2015  . DM (diabetes mellitus) ([place]) 03/06/2015  . HTN (hypertension) 03/06/2015  . Gastroparesis 02/24/2015  . Pleural effusion 02/19/2015  . HCAP (healthcare-associated pneumonia) 02/19/2015  . End-stage renal disease on hemodialysis ([place]) 02/19/2015    Past Surgical Hx Past Surgical History:  Procedure Laterality Date  . A/V SHUNTOGRAM Left 01/20/2018   Procedure: A/V SHUNTOGRAM;  Surgeon: Algernon Huxley, MD;  Location: [place] [facility];  Service: Cardiovascular;  Laterality: Left;  . ABDOMINAL HYSTERECTOMY  1992  . AMPUTATION TOE Left 10/02/2017   Procedure: AMPUTATION TOE-LEFT GREAT TOE;  Surgeon: Albertine Patricia, DPM;  Location: [facility] ORS;  Service: Podiatry;  Laterality: Left;  . APPENDECTOMY    . ARTERY BIOPSY Right 10/11/2018   Procedure: BIOPSY TEMPORAL ARTERY;  Surgeon: Vickie Epley, MD;  Location: [facility] ORS;  Service: General;  Laterality: Right;  . CARDIAC CATHETERIZATION Left 07/26/2015   Procedure: Left Heart Cath and Coronary Angiography;  Surgeon: Dionisio David, MD;  Location: [place] [facility];  Service: Cardiovascular;  Laterality: Left;  . CATARACT  EXTRACTION W/ INTRAOCULAR LENS IMPLANT Right   . CATARACT EXTRACTION W/PHACO Left 03/10/2017   Procedure: CATARACT EXTRACTION PHACO AND INTRAOCULAR LENS PLACEMENT (IOC);  Surgeon: Birder Robson, MD;  Location: [facility] ORS;  Service: Ophthalmology;  Laterality: Left;  [place] 00:51.9 AP% 14.2 CDE 7.39 fluid pack lot # [id number]  . CHOLECYSTECTOMY    . COLONOSCOPY WITH PROPOFOL N/A 08/12/2016   Procedure: COLONOSCOPY WITH PROPOFOL;  Surgeon: Lollie Sails, MD;  Location: [facility] ENDOSCOPY;  Service: Endoscopy;  Laterality: N/A;  . DIALYSIS FISTULA CREATION Left    upper arm  . dialysis grafts    . ESOPHAGOGASTRODUODENOSCOPY N/A 03/08/2015   Procedure: ESOPHAGOGASTRODUODENOSCOPY (EGD);  Surgeon: Manya Silvas, MD;  Location: [facility] ENDOSCOPY;  Service: Endoscopy;  Laterality: N/A;  . ESOPHAGOGASTRODUODENOSCOPY (EGD) WITH PROPOFOL N/A 03/18/2016   Procedure: ESOPHAGOGASTRODUODENOSCOPY (EGD) WITH PROPOFOL;  Surgeon: [name], MD;  Location: [facility] ENDOSCOPY;  Service: Endoscopy;  Laterality: N/A;  . EYE SURGERY Right 2018  . FECAL TRANSPLANT N/A 08/23/2015   Procedure: FECAL TRANSPLANT;  Surgeon: Manya Silvas, MD;  Location: [facility] ENDOSCOPY;  Service:  Endoscopy;  Laterality: N/A;  . HAND SURGERY Bilateral   . IR RADIOLOGIST EVAL & MGMT  07/28/2019  . IR RADIOLOGIST EVAL & MGMT  08/11/2019  . PERIPHERAL VASCULAR CATHETERIZATION N/A 12/20/2015   Procedure: Thrombectomy of dialysis access versus permcath placement;  Surgeon: Algernon Huxley, MD;  Location: [place] [facility];  Service: Cardiovascular;  Laterality: N/A;  . PERIPHERAL VASCULAR CATHETERIZATION N/A 12/20/2015   Procedure: A/V Shunt Intervention;  Surgeon: Algernon Huxley, MD;  Location: [place] [facility];  Service: Cardiovascular;  Laterality: N/A;  . PERIPHERAL VASCULAR CATHETERIZATION N/A 12/20/2015   Procedure: A/V Shuntogram/Fistulagram;  Surgeon: Algernon Huxley, MD;  Location: [place] [facility];  Service: Cardiovascular;  Laterality: N/A;   . PERIPHERAL VASCULAR CATHETERIZATION N/A 01/02/2016   Procedure: A/V Shuntogram/Fistulagram;  Surgeon: Algernon Huxley, MD;  Location: [place] [facility];  Service: Cardiovascular;  Laterality: N/A;  . PERIPHERAL VASCULAR CATHETERIZATION N/A 01/02/2016   Procedure: A/V Shunt Intervention;  Surgeon: Algernon Huxley, MD;  Location: [place] [facility];  Service: Cardiovascular;  Laterality: N/A;    Medications Prior to Admission medications   Medication Sig Start Date End Date Taking? Authorizing Provider  albuterol (PROVENTIL HFA;VENTOLIN HFA) 108 (90 Base) MCG/ACT inhaler Inhale 2 puffs into the lungs every 6 (six) hours as needed for wheezing or shortness of breath. 02/17/18   Merlyn Lot, MD  albuterol (PROVENTIL) (2.5 MG/3ML) 0.083% nebulizer solution Take 2.5 mg by nebulization every 4 (four) hours as needed for wheezing or shortness of breath.    [provider]  amLODipine (NORVASC) 10 MG tablet Take 10 mg by mouth daily.    [provider]  aspirin EC 81 MG tablet Take 81 mg by mouth daily. 09/08/16   [provider]  atorvastatin (LIPITOR) 20 MG tablet Take 20 mg by mouth every evening.  12/02/16   [provider]  Biotin 1 MG CAPS Take 1 mg by mouth daily.    [provider]  carvedilol (COREG) 3.125 MG tablet Take 1 tablet (3.125 mg total) by mouth 2 (two) times daily with a meal. 10/11/18   Epifanio Lesches, MD  cetirizine (ZYRTEC) 10 MG tablet Take 10 mg by mouth daily.     [provider]  chlorpheniramine-HYDROcodone (TUSSIONEX) 10-8 MG/5ML SUER Take 5 mLs by mouth every 12 (twelve) hours. 10/11/18   Epifanio Lesches, MD  cholecalciferol (VITAMIN D) 400 units TABS tablet Take 400 Units by mouth daily.    [provider]  clopidogrel (PLAVIX) 75 MG tablet Take 75 mg by mouth daily. 07/01/19   [provider]  cyanocobalamin 1000 MCG tablet Take 1,000 mcg by mouth daily.     [provider]   cyclobenzaprine (FLEXERIL) 10 MG tablet Take 1 tablet (10 mg total) by mouth 3 (three) times daily as needed. 11/22/18   Sable Feil, PA-C  diclofenac sodium (VOLTAREN) 1 % GEL Apply 1 application topically daily as needed (for rash).    [provider]  ferrous sulfate (CVS IRON) 325 (65 FE) MG tablet Take 325 mg by mouth daily with breakfast.    [provider]  FLUoxetine (PROZAC) 20 MG capsule Take 1 capsule (20 mg total) by mouth daily. Patient taking differently: Take 20 mg by mouth at bedtime.  04/10/16   Hower, Aaron Mose, MD  furosemide (LASIX) 80 MG tablet Take 80 mg by mouth daily. 07/09/19   [provider]  hydrocortisone cream 0.5 % Apply topically 3 (three)  times daily. 01/21/18   Wieting, Richard, MD  insulin aspart (NOVOLOG FLEXPEN) 100 UNIT/ML FlexPen Inject 4-15 Units into the skin 4 (four) times daily as needed for high blood sugar (> 100, per sliding scale).    [provider]  lipase/protease/amylase (CREON) 12000 UNITS CPEP capsule Take 36000 mg in the morning, 24000 midday & 36000 in the evening    [provider]  LORazepam (ATIVAN PO) Take by mouth.    [provider]  multivitamin (RENA-VIT) TABS tablet Take 1 tablet by mouth daily.     [provider]  nitroGLYCERIN (NITROSTAT) 0.4 MG SL tablet Place 0.4 mg under the tongue every 5 (five) minutes as needed for chest pain.     [provider]  predniSONE (DELTASONE) 10 MG tablet Take 6 tablets (60 mg total) by mouth daily with breakfast. Need to follow up with pcp in 3-5 days regarding biopsy results from temporal artery and decide wether to continue high dose prednsione or not 10/11/18   Epifanio Lesches, MD  ranitidine (ZANTAC) 300 MG capsule Take 1 capsule by mouth every evening. 01/19/18   [provider]  sevelamer carbonate (RENVELA) 800 MG tablet Take 800-2,400 mg by mouth See admin instructions. Take 2400 mg by mouth 3 times daily with  meals and take 800 mg by mouth with snacks    [provider]    Allergies Ace inhibitors, Ativan [lorazepam], Compazine [prochlorperazine edisylate], Sumatriptan succinate, Dilaudid [hydromorphone hcl], Ondansetron, Zofran [ondansetron hcl], Codeine, Gabapentin, [place], Losartan, Oxycodone, Prochlorperazine, Reglan [metoclopramide], Scopolamine, Tape, and Tapentadol  Family Hx Family History  Problem Relation Age of Onset  . Kidney disease Mother   . Diabetes Mother   . Cancer Father   . Kidney disease Sister     Social Hx Social History   Tobacco Use  . Smoking status: Former Smoker    Packs/day: 0.50    Years: 40.00    Pack years: 20.00    Types: Cigarettes    Quit date: 02/13/2015    Years since quitting: 4.5  . Smokeless tobacco: Never Used  Substance Use Topics  . Alcohol use: Not Currently    Comment: glass wine week per pt  . Drug use: Yes    Types: Marijuana    Comment: once a day     Review of Systems  Constitutional: Negative for fever, chills. Eyes: Negative for visual changes. ENT: Negative for sore throat. Cardiovascular: Negative for chest pain. Respiratory: Negative for shortness of breath. Gastrointestinal: + R sided abdominal pain Genitourinary: Negative for dysuria. Musculoskeletal: Negative for leg swelling. Skin: Negative for rash. Neurological: Negative for for headaches.   Physical Exam  Vital Signs: ED Triage Vitals  Enc Vitals Group     BP 08/24/19 1148 (!) 120/49     Pulse Rate 08/24/19 1148 (!) 56     Resp 08/24/19 1148 20     Temp 08/24/19 1148 97.6 F (36.4 C)     Temp Source 08/24/19 1148 Oral     SpO2 08/24/19 1148 100 %     Weight 08/24/19 1148 189 lb (85.7 kg)     Height 08/24/19 1148 5\' 5"  (1.651 m)     Head Circumference --      Peak Flow --      Pain Score 08/24/19 1157 10     Pain Loc --      Pain Edu? --      Excl. in [place]? --     Constitutional: Alert  and oriented. Resting/sleeping comfortably on  initially evaluation.  Head: Normocephalic. Atraumatic. Eyes: Conjunctivae clear. Sclera anicteric. Nose: No congestion. No rhinorrhea. Mouth/Throat: Wearing mask.  Neck: No stridor.   Cardiovascular: Normal rate, regular rhythm. Extremities well perfused. Fistula site in LUE with palpable thrill. Respiratory: Normal respiratory effort.  Lungs CTAB. Oxygen 100% on RA. Gastrointestinal: Soft. Minimal right mid abdominal tenderness, distractable. No rebound or guarding. Non-distended. Rectal: RN chaperone present. Dark black stool, guaiac negative.  Musculoskeletal: No lower extremity edema. No deformities. Neurologic:  Normal speech and language. No gross focal neurologic deficits are appreciated.  Skin: Skin is warm, dry and intact. No rash noted. Psychiatric: Mood and affect are appropriate for situation.  EKG  Personally reviewed.   Rate: 55 Rhythm: sinus Axis: normal Intervals: WNL No peaked T waves No STEMI    Radiology  CT A/P: IMPRESSION:  No acute abnormality abdomen or pelvis. No finding to explain the  patient's symptoms.  Small right pleural effusion and basilar atelectasis.  Atherosclerosis.  Diverticulosis without diverticulitis.    Procedures  Procedure(s) performed (including critical care):  Procedures   Initial Impression / Assessment and Plan / ED Course  58 y.o. female who presents to the ED for right sided abdominal pain, as above.  Ddx: gastritis/GERD, pancreatitis, nephrolithiasis. Less likely obstruction given normal BM today. Doubt biliary given she is s/p cholecystectomy.   Will obtain labs, imaging, symptom control and reassess.  Potassium minimally elvated at 5.6, though hemolyzed, so likely actually lower. No peaked T waves on EKG. Hemoglobin 8.6, anemia likely 2/2 kidney disease, though lower than 4 months ago. She denies any obvious sources of bleeding.  She does take iron pills, and therefore has dark stools at baseline.  Rectal exam  performed, guaiac negative.  CT without acute abnormalities.  Patient reports feeling improved after dose of oral pain medication, and has tolerated by mouth here.  As such, given negative work-up, patient is stable for discharge with outpatient follow-up.  Given she is not in any acute respiratory distress, K near norma w/o EKG changes, do not feel she needs emergent dialysis here. She already has a seat scheduled for dialysis tomorrow. Advised adherence to this. Patient voices understanding and is comfortable with the plan and discharge. Given return precautions.    Final Clinical Impression(s) / ED Diagnosis  Final diagnoses:  Right sided abdominal pain       Note:  This document was prepared using Dragon voice recognition software and may include unintentional dictation errors.     Lilia Pro., MD 08/24/19 [id number]

## 2019-08-24 NOTE — ED Notes (Signed)
Full rainbow sent to lab.  

## 2019-08-24 NOTE — ED Triage Notes (Signed)
Pt comes into the ED via EMS from dialysis, pt states she had sudden onset RUQ pain that started when she arrived to dialysis today, states she did not receive any of her treatment today. Denies N/V, pt is post gall bladder.

## 2019-08-24 NOTE — Discharge Instructions (Addendum)
Thank you for letting us take care of you in the emergency department today.   Please continue to take any regular, prescribed medications. Please be sure to go to your dialysis session tomorrow.   Please follow up with: - Your primary care doctor to review your ER visit and follow up on your symptoms.   Please return to the ER for any new or worsening symptoms.

## 2019-08-24 NOTE — ED Notes (Signed)
First Nurse Note:  Patient in via [facility] from Dialysis with complaints of severe RUQ abdominal pain.  Moaning in pain upon arrival.  Pt did not receive Dialysis treatment due to pain.  Hx of Pancreatitis.

## 2019-08-25 ENCOUNTER — Other Ambulatory Visit: Payer: Medicare Other

## 2019-08-31 ENCOUNTER — Other Ambulatory Visit: Payer: Self-pay

## 2019-08-31 ENCOUNTER — Other Ambulatory Visit: Payer: Self-pay | Admitting: *Deleted

## 2019-08-31 DIAGNOSIS — N185 Chronic kidney disease, stage 5: Secondary | ICD-10-CM

## 2019-08-31 DIAGNOSIS — D631 Anemia in chronic kidney disease: Secondary | ICD-10-CM

## 2019-09-01 ENCOUNTER — Other Ambulatory Visit: Payer: Self-pay

## 2019-09-01 ENCOUNTER — Inpatient Hospital Stay: Payer: Medicare Other | Attending: Oncology

## 2019-09-01 DIAGNOSIS — E1122 Type 2 diabetes mellitus with diabetic chronic kidney disease: Secondary | ICD-10-CM | POA: Insufficient documentation

## 2019-09-01 DIAGNOSIS — I5032 Chronic diastolic (congestive) heart failure: Secondary | ICD-10-CM | POA: Insufficient documentation

## 2019-09-01 DIAGNOSIS — I132 Hypertensive heart and chronic kidney disease with heart failure and with stage 5 chronic kidney disease, or end stage renal disease: Secondary | ICD-10-CM | POA: Diagnosis present

## 2019-09-01 DIAGNOSIS — N186 End stage renal disease: Secondary | ICD-10-CM | POA: Diagnosis not present

## 2019-09-01 DIAGNOSIS — Z794 Long term (current) use of insulin: Secondary | ICD-10-CM | POA: Insufficient documentation

## 2019-09-01 DIAGNOSIS — D472 Monoclonal gammopathy: Secondary | ICD-10-CM | POA: Insufficient documentation

## 2019-09-01 DIAGNOSIS — D631 Anemia in chronic kidney disease: Secondary | ICD-10-CM | POA: Diagnosis not present

## 2019-09-01 DIAGNOSIS — N185 Chronic kidney disease, stage 5: Secondary | ICD-10-CM

## 2019-09-01 DIAGNOSIS — Z992 Dependence on renal dialysis: Secondary | ICD-10-CM | POA: Insufficient documentation

## 2019-09-01 LAB — CBC WITH DIFFERENTIAL/PLATELET
Abs Immature Granulocytes: 0.01 10*3/uL (ref 0.00–0.07)
Basophils Absolute: 0 10*3/uL (ref 0.0–0.1)
Basophils Relative: 1 %
Eosinophils Absolute: 0.5 10*3/uL (ref 0.0–0.5)
Eosinophils Relative: 8 %
HCT: 28.4 % — ABNORMAL LOW (ref 36.0–46.0)
Hemoglobin: 8.8 g/dL — ABNORMAL LOW (ref 12.0–15.0)
Immature Granulocytes: 0 %
Lymphocytes Relative: 42 %
Lymphs Abs: 2.4 10*3/uL (ref 0.7–4.0)
MCH: 28.9 pg (ref 26.0–34.0)
MCHC: 31 g/dL (ref 30.0–36.0)
MCV: 93.4 fL (ref 80.0–100.0)
Monocytes Absolute: 0.6 10*3/uL (ref 0.1–1.0)
Monocytes Relative: 10 %
Neutro Abs: 2.1 10*3/uL (ref 1.7–7.7)
Neutrophils Relative %: 39 %
Platelets: 123 10*3/uL — ABNORMAL LOW (ref 150–400)
RBC: 3.04 MIL/uL — ABNORMAL LOW (ref 3.87–5.11)
RDW: 12.7 % (ref 11.5–15.5)
WBC: 5.6 10*3/uL (ref 4.0–10.5)
nRBC: 0 % (ref 0.0–0.2)

## 2019-09-01 LAB — FERRITIN: Ferritin: 977 ng/mL — ABNORMAL HIGH (ref 11–307)

## 2019-09-01 LAB — IRON AND TIBC
Iron: 175 ug/dL — ABNORMAL HIGH (ref 28–170)
Saturation Ratios: 93 % — ABNORMAL HIGH (ref 10.4–31.8)
TIBC: 188 ug/dL — ABNORMAL LOW (ref 250–450)
UIBC: 13 ug/dL

## 2019-09-08 IMAGING — MR MRI HEAD WITHOUT CONTRAST
13 series · 43 of 48 positions shown · non-contrast
Comparison: MRI of the head 04/22/2016.

CLINICAL DATA: Headaches and dizziness for several years.

EXAM:
MRI HEAD WITHOUT CONTRAST
TECHNIQUE: Multiplanar, multiecho pulse sequences of the brain and surrounding
structures were obtained without intravenous contrast.

[Series 5: ax dwi_tracew · axial · 3.0mm · 0.60mm/px · z∈[-91,+61]mm · 2 of 48 slices shown]
[im 1/48]
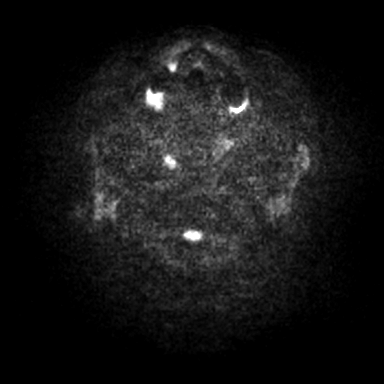
[im 48/48]
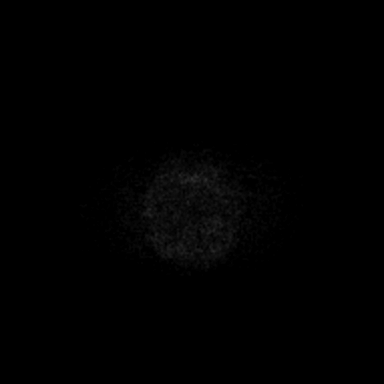

[Series 6: ax dwi_adc · axial · 3.0mm · 0.60mm/px · z∈[-91,+61]mm · 3 of 48 slices shown]
[im 1/48]
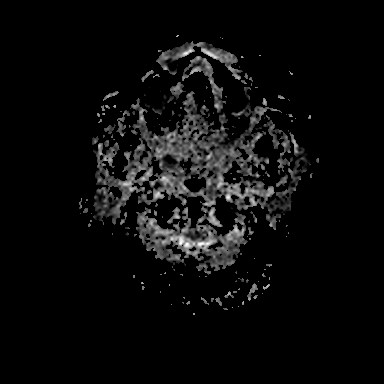
[im 24/48]
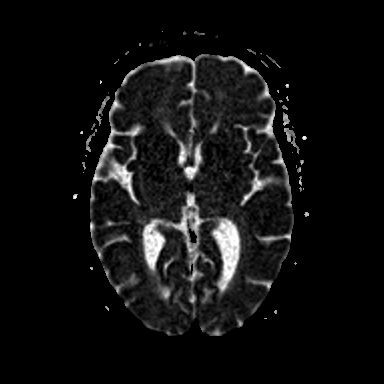
[im 48/48]
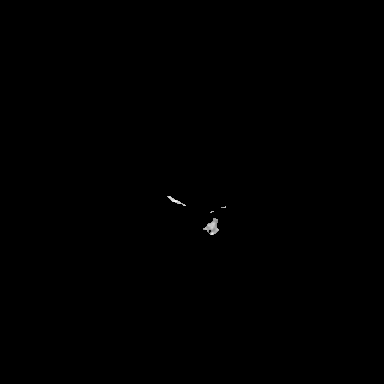

[Series 7: cor dwi_tracew · coronal · 5.0mm · 0.60mm/px · 3 of 38 slices shown (1 of 2)]
[im 1/38]
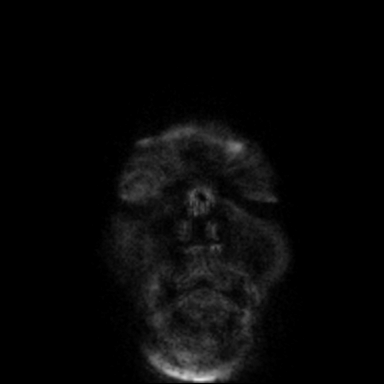
[im 19/38]
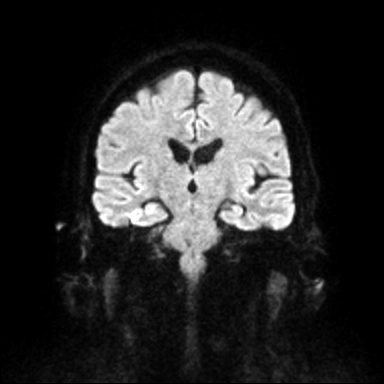
[im 38/38]
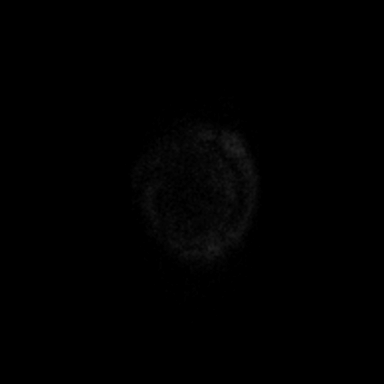

[Series 7: cor dwi_tracew · coronal · 5.0mm · 0.60mm/px · 3 of 38 slices shown (2 of 2)]
[im 1/38]
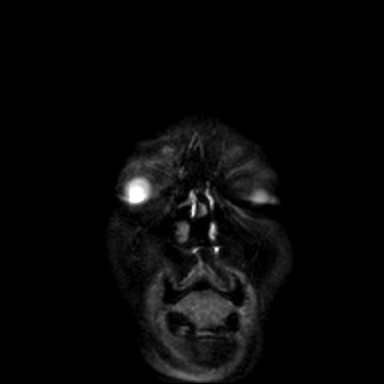
[im 19/38]
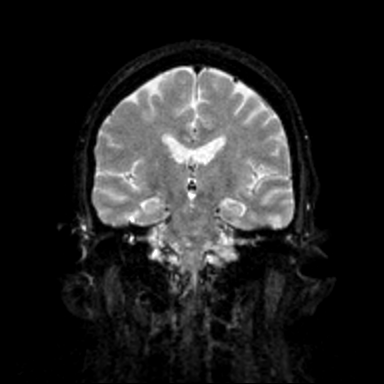
[im 38/38]
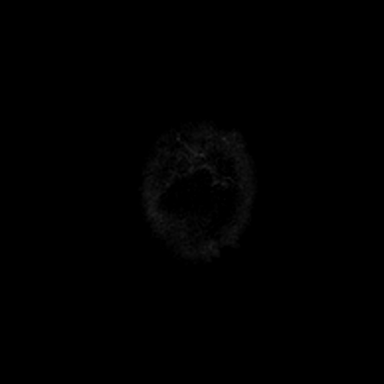

[Series 8: cor dwi_adc · coronal · 5.0mm · 0.60mm/px · 3 of 37 slices shown]
[im 1/37]
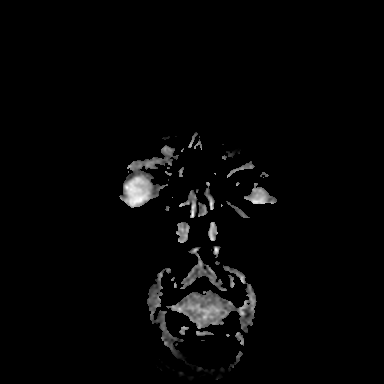
[im 19/37]
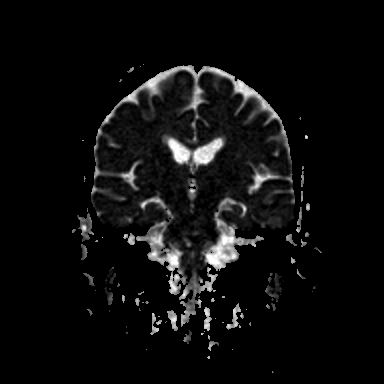
[im 37/37]
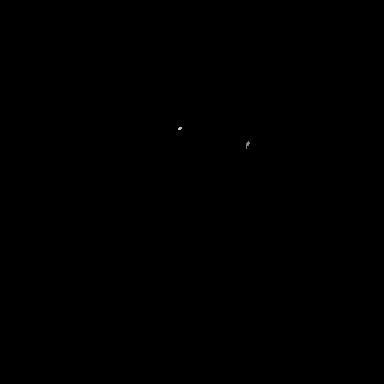

[Series 9: T1 · sagittal · 5.0mm · 0.62mm/px · 2 of 25 slices shown (1 of 2)]
[im 1/25]
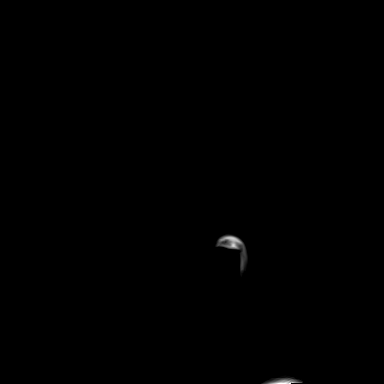
[im 25/25]
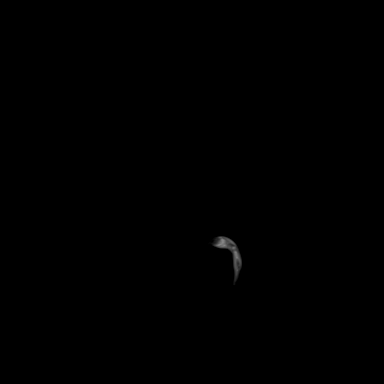

[Series 10: T2 · axial · 5.0mm · 0.53mm/px · z∈[-87,+56]mm · 2 of 25 slices shown (1 of 2)]
[im 1/25]
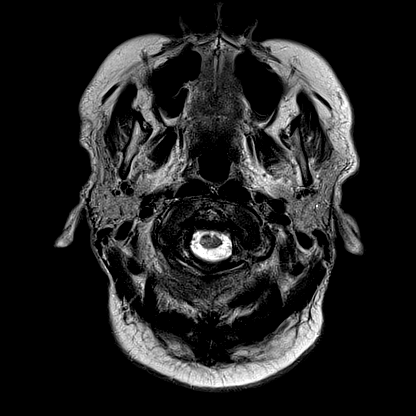
[im 25/25]
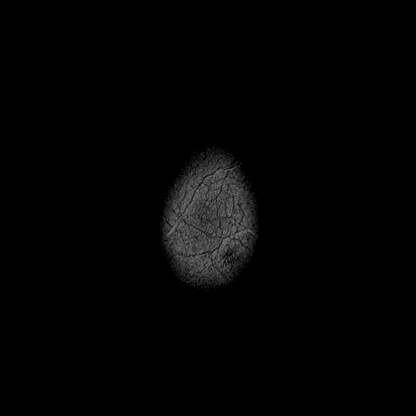

[Series 11: mag_images · axial · 3.0mm · 0.90mm/px · z∈[-102,+73]mm · 4 of 60 slices shown]
[im 1/60]
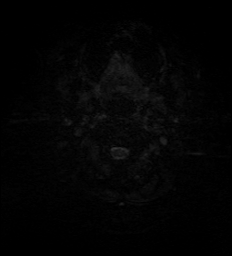
[im 20/60]
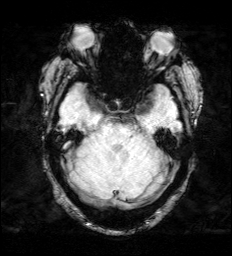
[im 40/60]
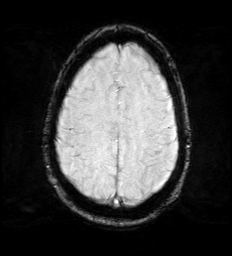
[im 60/60]
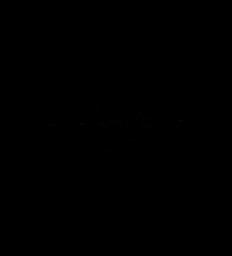

[Series 12: pha_images · axial · 3.0mm · 0.90mm/px · z∈[-102,+73]mm · 4 of 58 slices shown]
[im 1/58]
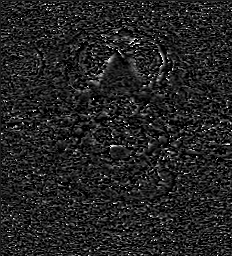
[im 20/58]
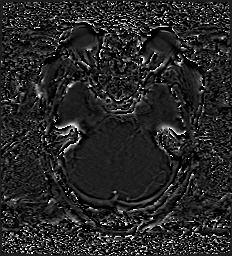
[im 39/58]
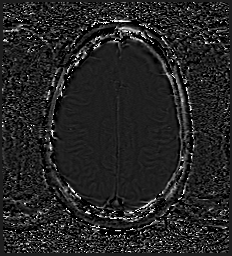
[im 58/58]
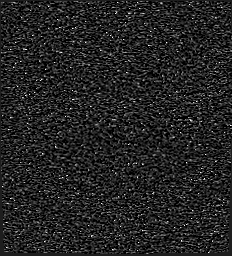

[Series 13: swi_images · axial · 3.0mm · 0.90mm/px · z∈[-102,+14]mm · 3 of 60 slices shown]
[im 1/60]
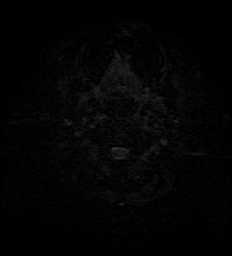
[im 20/60]
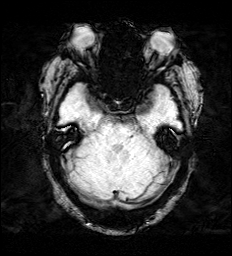
[im 40/60]
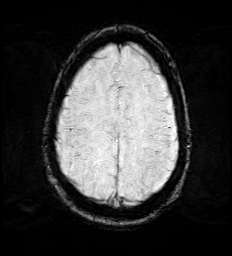

[Series 15: FLAIR · axial · 3.0mm · 0.53mm/px · z∈[-95,+65]mm · 4 of 55 slices shown]
[im 1/55]
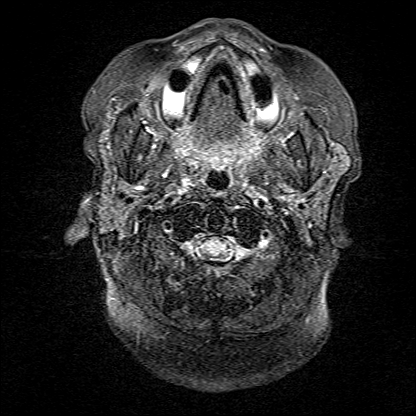
[im 19/55]
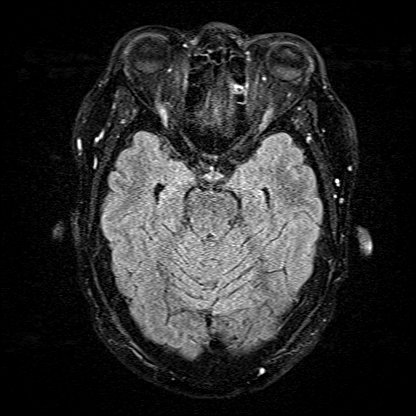
[im 37/55]
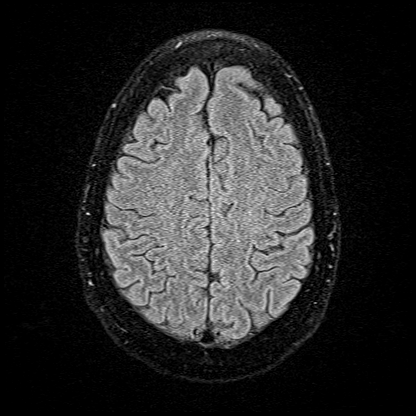
[im 55/55]
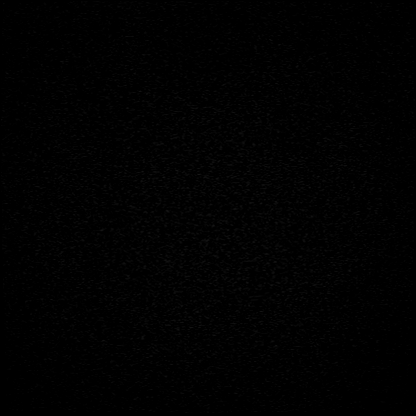

[Series 16: T1 · axial · 1.0mm · 0.98mm/px · z∈[-112,+62]mm · 8 of 176 slices shown (2 of 2)]
[im 1/176]
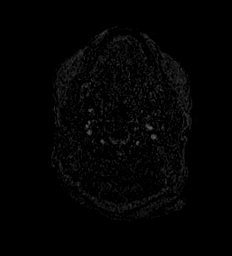
[im 32/176]
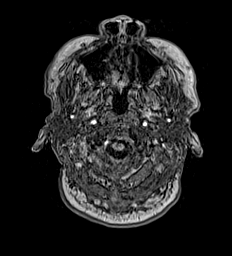
[im 48/176]
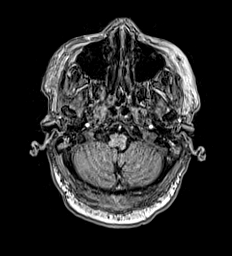
[im 80/176]
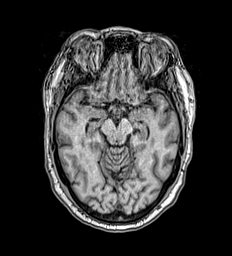
[im 96/176]
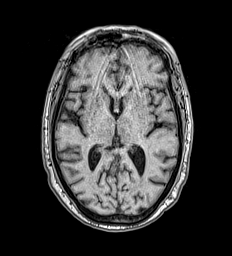
[im 128/176]
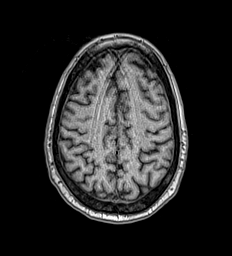
[im 144/176]
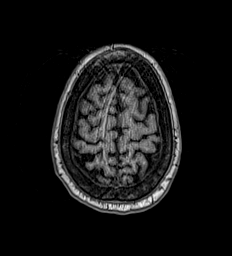
[im 176/176]
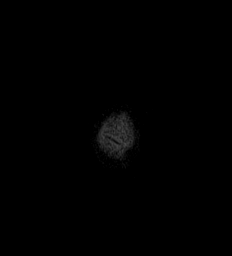

[Series 17: T2 · coronal · 5.0mm · 0.57mm/px · 2 of 29 slices shown (2 of 2)]
[im 1/29]
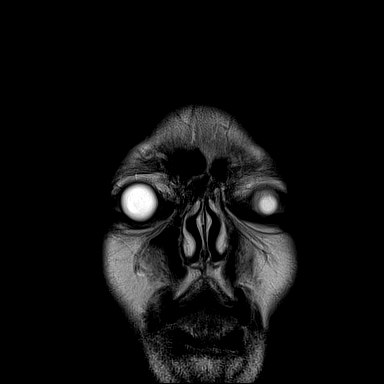
[im 29/29]
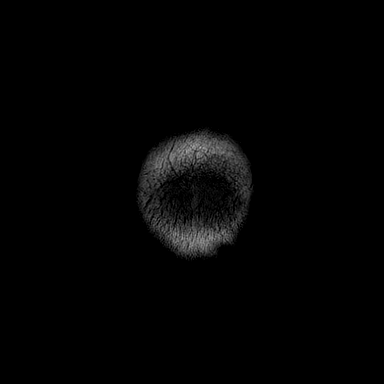

[43 of 48 positions shown; findings below may reference images not displayed]

FINDINGS: Brain: No acute infarct, hemorrhage, or mass lesion is present.
Atrophy and periventricular white matter changes are mildly advanced
for age. The ventricles are of normal size. No significant
extraaxial fluid collection is present. Basal ganglia are within
normal limits. Remote lacunar infarcts are present in the brainstem.
Cerebellum is unremarkable. Internal auditory canals are within
normal limits bilaterally.

Vascular: Flow is present in the major intracranial arteries.

Skull and upper cervical spine: Mild degenerative changes are
present at C2-3 and C3-4. Craniocervical junction is normal.

Sinuses/Orbits: A single left ethmoid air cell is chronically
opacified. The paranasal sinuses and mastoid air cells are otherwise
clear. Bilateral lens replacements are noted. Globes and orbits are
otherwise unremarkable.
IMPRESSION: 1. Stable atrophy and white matter disease, mildly advanced for age.
2. Remote lacunar infarcts of the pons.
3. No acute or focal lesion to explain the patient's headaches.

## 2019-09-14 ENCOUNTER — Other Ambulatory Visit
Admission: RE | Admit: 2019-09-14 | Discharge: 2019-09-14 | Disposition: A | Discharge status: Home / Self Care | Payer: Medicare Other | Source: Ambulatory Visit | Attending: Nephrology | Admitting: Nephrology

## 2019-09-14 DIAGNOSIS — N186 End stage renal disease: Secondary | ICD-10-CM | POA: Insufficient documentation

## 2019-09-14 LAB — HEMOGLOBIN: Hemoglobin: 6.3 g/dL — ABNORMAL LOW (ref 12.0–15.0)

## 2019-09-19 ENCOUNTER — Ambulatory Visit
Admission: RE | Admit: 2019-09-19 | Discharge: 2019-09-19 | Disposition: A | Discharge status: Home / Self Care | Payer: Medicare Other | Source: Ambulatory Visit | Attending: Nephrology | Admitting: Nephrology

## 2019-09-19 ENCOUNTER — Other Ambulatory Visit: Payer: Self-pay

## 2019-09-19 DIAGNOSIS — D649 Anemia, unspecified: Secondary | ICD-10-CM | POA: Diagnosis present

## 2019-09-19 LAB — PREPARE RBC (CROSSMATCH)

## 2019-09-19 MED ORDER — SODIUM CHLORIDE 0.9% IV SOLUTION: Freq: Once | INTRAVENOUS | Status: DC

## 2019-09-20 LAB — BPAM RBC
Blood Product Expiration Date: 202101022359
ISSUE DATE / TIME: 202012281204
Unit Type and Rh: 9500

## 2019-09-20 LAB — TYPE AND SCREEN
ABO/RH(D): O POS
Antibody Screen: NEGATIVE
Unit division: 0

## 2019-09-21 ENCOUNTER — Other Ambulatory Visit
Admission: RE | Admit: 2019-09-21 | Discharge: 2019-09-21 | Disposition: A | Discharge status: Home / Self Care | Payer: Medicare Other | Source: Ambulatory Visit | Attending: Nephrology | Admitting: Nephrology

## 2019-09-21 DIAGNOSIS — N186 End stage renal disease: Secondary | ICD-10-CM | POA: Insufficient documentation

## 2019-09-21 LAB — HEMOGLOBIN: Hemoglobin: 7.8 g/dL — ABNORMAL LOW (ref 12.0–15.0)

## 2019-09-29 ENCOUNTER — Ambulatory Visit (INDEPENDENT_AMBULATORY_CARE_PROVIDER_SITE_OTHER): Payer: Medicare Other

## 2019-09-29 ENCOUNTER — Inpatient Hospital Stay: Payer: Medicaid Other

## 2019-09-29 ENCOUNTER — Ambulatory Visit (INDEPENDENT_AMBULATORY_CARE_PROVIDER_SITE_OTHER): Payer: Medicare Other | Admitting: Vascular Surgery

## 2019-09-29 ENCOUNTER — Ambulatory Visit: Payer: Medicare Other | Admitting: Oncology

## 2019-09-29 ENCOUNTER — Encounter (INDEPENDENT_AMBULATORY_CARE_PROVIDER_SITE_OTHER): Payer: Self-pay | Admitting: Vascular Surgery

## 2019-09-29 ENCOUNTER — Other Ambulatory Visit: Payer: Self-pay

## 2019-09-29 VITALS — BP 161/66 | HR 60 | Resp 15 | Wt 196.2 lb

## 2019-09-29 DIAGNOSIS — N186 End stage renal disease: Secondary | ICD-10-CM | POA: Diagnosis not present

## 2019-09-29 DIAGNOSIS — I6523 Occlusion and stenosis of bilateral carotid arteries: Secondary | ICD-10-CM

## 2019-09-29 DIAGNOSIS — I70213 Atherosclerosis of native arteries of extremities with intermittent claudication, bilateral legs: Secondary | ICD-10-CM | POA: Diagnosis not present

## 2019-09-29 DIAGNOSIS — T829XXS Unspecified complication of cardiac and vascular prosthetic device, implant and graft, sequela: Secondary | ICD-10-CM | POA: Diagnosis not present

## 2019-09-29 DIAGNOSIS — M79605 Pain in left leg: Secondary | ICD-10-CM

## 2019-09-29 DIAGNOSIS — M79604 Pain in right leg: Secondary | ICD-10-CM

## 2019-09-29 DIAGNOSIS — I2510 Atherosclerotic heart disease of native coronary artery without angina pectoris: Secondary | ICD-10-CM

## 2019-09-29 DIAGNOSIS — Z992 Dependence on renal dialysis: Secondary | ICD-10-CM

## 2019-09-29 DIAGNOSIS — I872 Venous insufficiency (chronic) (peripheral): Secondary | ICD-10-CM

## 2019-09-29 NOTE — Progress Notes (Signed)
MRN : [id number]  [name] is a 59 y.o. (10-29-1960) female who presents with chief complaint of No chief complaint on file. Marland Kitchen  History of Present Illness:   The patient is seen for follow up evaluation of carotid stenosis. The carotid stenosis followed by ultrasound.   The patient denies amaurosis fugax. There is no recent history of TIA symptoms or focal motor deficits. There is no prior documented CVA.  The patient is taking enteric-coated aspirin 81 mg daily.  There is no history of migraine headaches. There is no history of seizures.  The patient also note worsening leg weakness.    She notes she has pain just trying to stand up.  She describes sitting at a table while creating a collage as a family memento and as she gets a little tired and leans back to relax she is unable to lean forward because of pain she has problems shifting from side to side at night in bed because of pain.  She does have a history of degenerative spine disease.    There have not been any interval development of a ulcerations or wounds.  Since the previous visit the patient has been wearing graduated compression stockings and has noted some improvement in the lymphedema. The patient has been using compression routinely morning until night.  The patient also states elevation during the day and exercise is being done too.  Currently the patient is maintained via aleft brachial axillary graft. The patient notesnoincrease in bleeding time after decannulation. The patient hasnotbeen informed that there is any problem withincreased recirculation.  The patient denies hand pain or other symptoms consistent with steal phenomena. No significant arm swelling.  The patient denies redness or swelling at the access site. The patient denies fever or chills at home or while on dialysis.  The patient has a history of coronary artery disease, no recent episodes of angina or shortness of breath. The  patient denies PAD or claudication symptoms. There is a history of hyperlipidemia which is being treated with a statin.  Duplex ultrasound of the carotid arteries performed today demonstrates 80 to 99% RICA and 40 to 59% diameter reduction in the LICA  Previous duplex ultrasound of the AV access shows a patent access. The previously noted stenosis is unchanged compared to last study.  The patient has a flow volume of 1251 with a patent Dril throughout. There is some mild narrowing in the mid graft with no significant velocities increased.   No outpatient medications have been marked as taking for the 09/29/19 encounter (Appointment) with Delana Meyer, Dolores Lory, MD.    Past Medical History:  Diagnosis Date  . Anemia   . Anginal pain ([place])   . Anxiety   . Arthritis   . Asthma   . Broken wrist   . Bronchitis   . chronic diastolic CHF 12/29/8117  . COPD (chronic obstructive pulmonary disease) ([place])   . Coronary artery disease    a. cath 2013: stenting to RCA (report not available); b. cath 2014: LM nl, pLAD 40%, mLAD nl, ost LCx 40%, mid LCx nl, pRCA 30% @ site of prior stent, mRCA 50%  . Depression   . Diabetes mellitus without complication ([place])   . Diabetic neuropathy ([place])   . dialysis 2006  . Diverticulosis   . Dizziness   . Dyspnea   . Elevated lipids   . Environmental and seasonal allergies   . ESRD (end stage renal disease) on dialysis ([place])  M-W-F  . GERD (gastroesophageal reflux disease)   . Headache   . History of hiatal hernia   . HOH (hard of hearing)   . Hx of pancreatitis 2015  . Hypertension   . Lower extremity edema   . Mitral regurgitation    a. echo 10/2013: EF 62%, noWMA, mildly dilated LA, mild to mod MR/TR, GR1DD  . Myocardial infarction ([place])   . Orthopnea   . Pneumonia   . Renal cancer ([place])   . Renal insufficiency    Pt is on dialysis on M,W + F.  . Wheezing     Past Surgical History:  Procedure Laterality Date  . A/V SHUNTOGRAM Left 01/20/2018    Procedure: A/V SHUNTOGRAM;  Surgeon: Algernon Huxley, MD;  Location: [facility];  Service: Cardiovascular;  Laterality: Left;  . ABDOMINAL HYSTERECTOMY  1992  . AMPUTATION TOE Left 10/02/2017   Procedure: AMPUTATION TOE-LEFT GREAT TOE;  Surgeon: Albertine Patricia, DPM;  Location: [facility];  Service: Podiatry;  Laterality: Left;  . APPENDECTOMY    . ARTERY BIOPSY Right 10/11/2018   Procedure: BIOPSY TEMPORAL ARTERY;  Surgeon: Vickie Epley, MD;  Location: [facility];  Service: General;  Laterality: Right;  . CARDIAC CATHETERIZATION Left 07/26/2015   Procedure: Left Heart Cath and Coronary Angiography;  Surgeon: Dionisio David, MD;  Location: [facility];  Service: Cardiovascular;  Laterality: Left;  . CATARACT EXTRACTION W/ INTRAOCULAR LENS IMPLANT Right   . CATARACT EXTRACTION W/PHACO Left 03/10/2017   Procedure: CATARACT EXTRACTION PHACO AND INTRAOCULAR LENS PLACEMENT (IOC);  Surgeon: Birder Robson, MD;  Location: [facility];  Service: Ophthalmology;  Laterality: Left;  [place] 00:51.9 AP% 14.2 CDE 7.39 fluid pack lot # [id number]  . CHOLECYSTECTOMY    . COLONOSCOPY WITH PROPOFOL N/A 08/12/2016   Procedure: COLONOSCOPY WITH PROPOFOL;  Surgeon: Lollie Sails, MD;  Location: [facility];  Service: Endoscopy;  Laterality: N/A;  . DIALYSIS FISTULA CREATION Left    upper arm  . dialysis grafts    . ESOPHAGOGASTRODUODENOSCOPY N/A 03/08/2015   Procedure: ESOPHAGOGASTRODUODENOSCOPY (EGD);  Surgeon: Manya Silvas, MD;  Location: [facility] ENDOSCOPY;  Service: Endoscopy;  Laterality: N/A;  . ESOPHAGOGASTRODUODENOSCOPY (EGD) WITH PROPOFOL N/A 03/18/2016   Procedure: ESOPHAGOGASTRODUODENOSCOPY (EGD) WITH PROPOFOL;  Surgeon: [name], MD;  Location: [facility] ENDOSCOPY;  Service: Endoscopy;  Laterality: N/A;  . EYE SURGERY Right 2018  . FECAL TRANSPLANT N/A 08/23/2015   Procedure: FECAL TRANSPLANT;  Surgeon: Manya Silvas, MD;  Location: [facility] ENDOSCOPY;  Service: Endoscopy;  Laterality:  N/A;  . HAND SURGERY Bilateral   . IR RADIOLOGIST EVAL & MGMT  07/28/2019  . IR RADIOLOGIST EVAL & MGMT  08/11/2019  . PERIPHERAL VASCULAR CATHETERIZATION N/A 12/20/2015   Procedure: Thrombectomy of dialysis access versus permcath placement;  Surgeon: Algernon Huxley, MD;  Location: [facility];  Service: Cardiovascular;  Laterality: N/A;  . PERIPHERAL VASCULAR CATHETERIZATION N/A 12/20/2015   Procedure: A/V Shunt Intervention;  Surgeon: Algernon Huxley, MD;  Location: [facility];  Service: Cardiovascular;  Laterality: N/A;  . PERIPHERAL VASCULAR CATHETERIZATION N/A 12/20/2015   Procedure: A/V Shuntogram/Fistulagram;  Surgeon: Algernon Huxley, MD;  Location: [facility];  Service: Cardiovascular;  Laterality: N/A;  . PERIPHERAL VASCULAR CATHETERIZATION N/A 01/02/2016   Procedure: A/V Shuntogram/Fistulagram;  Surgeon: Algernon Huxley, MD;  Location: [facility];  Service: Cardiovascular;  Laterality: N/A;  . PERIPHERAL VASCULAR CATHETERIZATION N/A 01/02/2016   Procedure: A/V Shunt  Intervention;  Surgeon: Algernon Huxley, MD;  Location: [facility];  Service: Cardiovascular;  Laterality: N/A;    Social History Social History   Tobacco Use  . Smoking status: Former Smoker    Packs/day: 0.50    Years: 40.00    Pack years: 20.00    Types: Cigarettes    Quit date: 02/13/2015    Years since quitting: 4.6  . Smokeless tobacco: Never Used  Substance Use Topics  . Alcohol use: Not Currently    Comment: glass wine week per pt  . Drug use: Yes    Types: Marijuana    Comment: once a day    Family History Family History  Problem Relation Age of Onset  . Kidney disease Mother   . Diabetes Mother   . Cancer Father   . Kidney disease Sister     Allergies  Allergen Reactions  . Ace Inhibitors Swelling and Anaphylaxis  . Ativan [Lorazepam] Other (See Comments)    Reaction:Hallucinations and headaches  . Compazine [Prochlorperazine Edisylate] Anaphylaxis, Nausea And  Vomiting and Other (See Comments)    Other reaction(s): dystonia from this vs. Reglan, 23 Jul - patient relates that she takes promethazine frequently with no problems  . Sumatriptan Succinate Other (See Comments)    Other reaction(s): delirium and hallucinations per [facility] records  . Dilaudid [Hydromorphone Hcl] Other (See Comments)    Delirium   . Ondansetron Other (See Comments)    hallucinations    . Zofran [Ondansetron Hcl] Other (See Comments)    Reaction:  hallucinations   . Codeine Nausea And Vomiting  . Gabapentin Other (See Comments)    Reaction:  Unknown    . Lac [place] Nausea And Vomiting  . Losartan Nausea Only  . Oxycodone Anxiety  . Prochlorperazine Other (See Comments)    Reaction:  Unknown . Patient does not remember reaction but she does have vertigo and anxiety along with n and v at times. Could be used to treat any of these   . Reglan [Metoclopramide] Other (See Comments)    Per patient her Dr. Evelina Bucy her off it   . Scopolamine Other (See Comments)    Dizziness, also has vertigo already  . Tape Rash  . Tapentadol Rash     REVIEW OF SYSTEMS (Negative unless checked)  Constitutional: [] Weight loss  [] Fever  [] Chills Cardiac: [] Chest pain   [] Chest pressure   [] Palpitations   [] Shortness of breath when laying flat   [] Shortness of breath with exertion. Vascular:  [] Pain in legs with walking   [x] Pain in legs at rest  [] History of DVT   [] Phlebitis   [x] Swelling in legs   [] Varicose veins   [] Non-healing ulcers Pulmonary:   [] Uses home oxygen   [] Productive cough   [] Hemoptysis   [] Wheeze  [x] COPD   [] Asthma Neurologic:  [] Dizziness   [] Seizures   [] History of stroke   [] History of TIA  [] Aphasia   [] Vissual changes   [] Weakness or numbness in arm   [x] Weakness or numbness in leg Musculoskeletal:   [] Joint swelling   [x] Joint pain   [] Low back pain Hematologic:  [] Easy bruising  [] Easy bleeding   [] Hypercoagulable state   [] Anemic Gastrointestinal:  [] Diarrhea    [] Vomiting  [] Gastroesophageal reflux/heartburn   [] Difficulty swallowing. Genitourinary:  [] Chronic kidney disease   [] Difficult urination  [] Frequent urination   [] Blood in urine Skin:  [] Rashes   [] Ulcers  Psychological:  [] History of anxiety   []  History of major depression.  Physical Examination  There were no vitals filed for this visit. There is no height or weight on file to calculate BMI. Gen: WD/WN, NAD Head: [place]/AT, No temporalis wasting.  Ear/Nose/Throat: Hearing grossly intact, nares w/o erythema or drainage Eyes: PER, EOMI, sclera nonicteric.  Neck: Supple, no large masses.   Pulmonary:  Good air movement, no audible wheezing bilaterally, no use of accessory muscles.  Cardiac: RRR, no JVD Vascular: right carotid bruit;  scattered varicosities present bilaterally.  Mild venous stasis changes to the legs bilaterally.  2-3+ soft pitting edema Vessel Right Left  Radial Palpable Palpable  Carotid Palpable Palpable  PT Not Palpable Not Palpable  DP Not Palpable Not Palpable  Gastrointestinal: Non-distended. No guarding/no peritoneal signs.  Musculoskeletal: M/S 5/5 throughout.  No deformity or atrophy.  Neurologic: CN 2-12 intact. Symmetrical.  Speech is fluent. Motor exam as listed above. Psychiatric: Judgment intact, Mood & affect appropriate for pt's clinical situation. Dermatologic: No rashes or ulcers noted.  No changes consistent with cellulitis. Lymph : No lichenification or skin changes of chronic lymphedema.  CBC Lab Results  Component Value Date   WBC 5.6 09/01/2019   HGB 7.8 (L) 09/21/2019   HCT 28.4 (L) 09/01/2019   MCV 93.4 09/01/2019   PLT 123 (L) 09/01/2019    BMET    Component Value Date/Time   NA 131 (L) 08/24/2019 1200   NA 135 (L) 09/15/2014 0948   K 5.6 (H) 08/24/2019 1200   K 4.8 09/15/2014 0948   CL 95 (L) 08/24/2019 1200   CL 99 09/15/2014 0948   CO2 23 08/24/2019 1200   CO2 26 09/15/2014 0948   GLUCOSE 90 08/24/2019 1200   GLUCOSE 118  (H) 09/15/2014 0948   BUN 67 (H) 08/24/2019 1200   BUN 19 (H) 09/15/2014 0948   CREATININE 10.05 (H) 08/24/2019 1200   CREATININE 6.79 (H) 09/15/2014 0948   CALCIUM 8.8 (L) 08/24/2019 1200   CALCIUM 8.3 (L) 09/15/2014 0948   GFRNONAA 4 (L) 08/24/2019 1200   GFRNONAA 7 (L) 09/15/2014 0948   GFRNONAA 6 (L) 05/31/2014 0432   GFRAA 4 (L) 08/24/2019 1200   GFRAA 8 (L) 09/15/2014 0948   GFRAA 7 (L) 05/31/2014 0432   CrCl cannot be calculated (Patient's most recent lab result is older than the maximum 21 days allowed.).  COAG Lab Results  Component Value Date   INR 0.93 09/30/2017   INR 1.08 04/21/2016   INR 0.97 12/28/2015    Radiology No results found.   Assessment/Plan 1. Bilateral carotid artery stenosis The patient remains asymptomatic with respect to the carotid stenosis.  However, the patient has now progressed and has a lesion the is >70%.  Patient should undergo CT angiography of the carotid arteries to define the degree of stenosis of the internal carotid arteries bilaterally and the anatomic suitability for surgery vs. intervention.  If the patient does indeed need surgery cardiac clearance will be required, once cleared the patient will be scheduled for surgery.  The risks, benefits and alternative therapies were reviewed in detail with the patient.  All questions were answered.  The patient agrees to proceed with imaging.  Continue antiplatelet therapy as prescribed. Continue management of CAD, HTN and Hyperlipidemia. Healthy heart diet, encouraged exercise at least 4 times per week.   - CT ANGIO NECK W OR WO CONTRAST; Future  2. Pain in both lower extremities  Recommend:  The patient has atypical pain symptoms for pure atherosclerotic disease. However, on physical exam there is  evidence of mixed venous and arterial disease, given the diminished pulses and the edema associated with venous changes of the legs.  Noninvasive studies including ABI's and venous  ultrasound of the legs will be obtained and the patient will follow up with me to review these studies.  I suspect the patient is c/o pseudoclaudication.  Patient should have an evaluation of his LS spine which I defer to the primary service.  The patient should continue walking and begin a more formal exercise program. The patient should continue his antiplatelet therapy and aggressive treatment of the lipid abnormalities.  The patient should begin wearing graduated compression socks 15-20 mmHg strength to control edema.  - Ambulatory referral to Neurosurgery - IVC/ILIACS DUPLEX; Future - ABI; Future  3. Atherosclerosis of native artery of both lower extremities with intermittent claudication (HCC)  Recommend:  The patient has atypical pain symptoms for pure atherosclerotic disease. However, on physical exam there is evidence of mixed venous and arterial disease, given the diminished pulses and the edema associated with venous changes of the legs.  Noninvasive studies including ABI's and venous ultrasound of the legs will be obtained and the patient will follow up with me to review these studies.  I suspect the patient is c/o pseudoclaudication.  Patient should have an evaluation of his LS spine which I defer to the primary service.  The patient should continue walking and begin a more formal exercise program. The patient should continue his antiplatelet therapy and aggressive treatment of the lipid abnormalities.  The patient should begin wearing graduated compression socks 15-20 mmHg strength to control edema.  - IVC/ILIACS DUPLEX; Future - ABI; Future  4. Complication of vascular access for dialysis, sequela Recommend:  The patient is doing well and currently has adequate dialysis access. The patient's dialysis center is not reporting any access issues. Flow pattern is stable when compared to the prior ultrasound.  The patient should have a duplex ultrasound of the dialysis  access in 6 months. The patient will follow-up with me in the office after each ultrasound     5. End-stage renal disease on hemodialysis ([place]) At the present time the patient has adequate dialysis access.  Continue hemodialysis as ordered without interruption.  Avoid nephrotoxic medications and dehydration.  Further plans per nephrology  6. Coronary artery disease involving native coronary artery of native heart without angina pectoris Continue cardiac and antihypertensive medications as already ordered and reviewed, no changes at this time.  Continue statin as ordered and reviewed, no changes at this time  Nitrates PRN for chest pain   7. Chronic venous insufficiency Recommend:  No surgery or intervention at this point in time.    I have reviewed my previous discussion with the patient regarding swelling and why it causes symptoms.  Patient will continue wearing graduated compression stockings class 1 (20-30 mmHg) on a daily basis. The patient will  beginning wearing the stockings first thing in the morning and removing them in the evening. The patient is instructed specifically not to sleep in the stockings.    In addition, behavioral modification including several periods of elevation of the lower extremities during the day will be continued.  This was reviewed with the patient during the initial visit.  The patient will also continue routine exercise, especially walking on a daily basis as was discussed during the initial visit.    Despite conservative treatments including graduated compression therapy class 1 and behavioral modification including exercise and elevation the patient  has not  obtained adequate control of the lymphedema.  The patient still has stage 3 lymphedema and therefore, I believe that a lymph pump should be added to improve the control of the patient's lymphedema.  Additionally, a lymph pump is warranted because it will reduce the risk of cellulitis and  ulceration in the future.  Patient should follow-up in six months with respect to this problem     Hortencia Pilar, MD  09/29/2019 10:07 AM

## 2019-09-30 ENCOUNTER — Observation Stay
Admission: EM | Admit: 2019-09-30 | Discharge: 2019-10-01 | Disposition: A | Discharge status: Home / Self Care | Payer: Medicare Other | Attending: Internal Medicine | Admitting: Internal Medicine

## 2019-09-30 ENCOUNTER — Emergency Department: Payer: Medicare Other

## 2019-09-30 ENCOUNTER — Other Ambulatory Visit: Payer: Self-pay

## 2019-09-30 ENCOUNTER — Encounter: Payer: Self-pay | Admitting: Emergency Medicine

## 2019-09-30 DIAGNOSIS — K3184 Gastroparesis: Secondary | ICD-10-CM | POA: Diagnosis not present

## 2019-09-30 DIAGNOSIS — I340 Nonrheumatic mitral (valve) insufficiency: Secondary | ICD-10-CM | POA: Insufficient documentation

## 2019-09-30 DIAGNOSIS — K449 Diaphragmatic hernia without obstruction or gangrene: Secondary | ICD-10-CM | POA: Diagnosis not present

## 2019-09-30 DIAGNOSIS — I2510 Atherosclerotic heart disease of native coronary artery without angina pectoris: Secondary | ICD-10-CM | POA: Diagnosis not present

## 2019-09-30 DIAGNOSIS — D631 Anemia in chronic kidney disease: Secondary | ICD-10-CM | POA: Insufficient documentation

## 2019-09-30 DIAGNOSIS — J189 Pneumonia, unspecified organism: Secondary | ICD-10-CM | POA: Diagnosis present

## 2019-09-30 DIAGNOSIS — N186 End stage renal disease: Secondary | ICD-10-CM | POA: Diagnosis present

## 2019-09-30 DIAGNOSIS — R519 Headache, unspecified: Secondary | ICD-10-CM | POA: Insufficient documentation

## 2019-09-30 DIAGNOSIS — R0989 Other specified symptoms and signs involving the circulatory and respiratory systems: Secondary | ICD-10-CM | POA: Insufficient documentation

## 2019-09-30 DIAGNOSIS — Z791 Long term (current) use of non-steroidal anti-inflammatories (NSAID): Secondary | ICD-10-CM | POA: Insufficient documentation

## 2019-09-30 DIAGNOSIS — Z7982 Long term (current) use of aspirin: Secondary | ICD-10-CM | POA: Insufficient documentation

## 2019-09-30 DIAGNOSIS — N2581 Secondary hyperparathyroidism of renal origin: Secondary | ICD-10-CM | POA: Insufficient documentation

## 2019-09-30 DIAGNOSIS — Z89412 Acquired absence of left great toe: Secondary | ICD-10-CM | POA: Insufficient documentation

## 2019-09-30 DIAGNOSIS — Z888 Allergy status to other drugs, medicaments and biological substances status: Secondary | ICD-10-CM | POA: Insufficient documentation

## 2019-09-30 DIAGNOSIS — K921 Melena: Secondary | ICD-10-CM | POA: Insufficient documentation

## 2019-09-30 DIAGNOSIS — Z794 Long term (current) use of insulin: Secondary | ICD-10-CM | POA: Insufficient documentation

## 2019-09-30 DIAGNOSIS — I252 Old myocardial infarction: Secondary | ICD-10-CM | POA: Insufficient documentation

## 2019-09-30 DIAGNOSIS — I5032 Chronic diastolic (congestive) heart failure: Secondary | ICD-10-CM | POA: Insufficient documentation

## 2019-09-30 DIAGNOSIS — R0789 Other chest pain: Secondary | ICD-10-CM | POA: Insufficient documentation

## 2019-09-30 DIAGNOSIS — F419 Anxiety disorder, unspecified: Secondary | ICD-10-CM | POA: Diagnosis not present

## 2019-09-30 DIAGNOSIS — Y95 Nosocomial condition: Secondary | ICD-10-CM | POA: Insufficient documentation

## 2019-09-30 DIAGNOSIS — Z9842 Cataract extraction status, left eye: Secondary | ICD-10-CM | POA: Insufficient documentation

## 2019-09-30 DIAGNOSIS — N179 Acute kidney failure, unspecified: Secondary | ICD-10-CM | POA: Insufficient documentation

## 2019-09-30 DIAGNOSIS — R0602 Shortness of breath: Secondary | ICD-10-CM | POA: Insufficient documentation

## 2019-09-30 DIAGNOSIS — E46 Unspecified protein-calorie malnutrition: Secondary | ICD-10-CM | POA: Insufficient documentation

## 2019-09-30 DIAGNOSIS — F329 Major depressive disorder, single episode, unspecified: Secondary | ICD-10-CM | POA: Diagnosis not present

## 2019-09-30 DIAGNOSIS — I132 Hypertensive heart and chronic kidney disease with heart failure and with stage 5 chronic kidney disease, or end stage renal disease: Secondary | ICD-10-CM | POA: Insufficient documentation

## 2019-09-30 DIAGNOSIS — E1122 Type 2 diabetes mellitus with diabetic chronic kidney disease: Secondary | ICD-10-CM | POA: Diagnosis not present

## 2019-09-30 DIAGNOSIS — J449 Chronic obstructive pulmonary disease, unspecified: Secondary | ICD-10-CM | POA: Insufficient documentation

## 2019-09-30 DIAGNOSIS — Z87891 Personal history of nicotine dependence: Secondary | ICD-10-CM | POA: Insufficient documentation

## 2019-09-30 DIAGNOSIS — Z7952 Long term (current) use of systemic steroids: Secondary | ICD-10-CM | POA: Insufficient documentation

## 2019-09-30 DIAGNOSIS — E1140 Type 2 diabetes mellitus with diabetic neuropathy, unspecified: Secondary | ICD-10-CM | POA: Insufficient documentation

## 2019-09-30 DIAGNOSIS — K2100 Gastro-esophageal reflux disease with esophagitis, without bleeding: Secondary | ICD-10-CM | POA: Insufficient documentation

## 2019-09-30 DIAGNOSIS — Z90710 Acquired absence of both cervix and uterus: Secondary | ICD-10-CM | POA: Insufficient documentation

## 2019-09-30 DIAGNOSIS — Z992 Dependence on renal dialysis: Secondary | ICD-10-CM | POA: Diagnosis present

## 2019-09-30 DIAGNOSIS — E1143 Type 2 diabetes mellitus with diabetic autonomic (poly)neuropathy: Secondary | ICD-10-CM | POA: Diagnosis not present

## 2019-09-30 DIAGNOSIS — R918 Other nonspecific abnormal finding of lung field: Secondary | ICD-10-CM | POA: Insufficient documentation

## 2019-09-30 DIAGNOSIS — Z9049 Acquired absence of other specified parts of digestive tract: Secondary | ICD-10-CM | POA: Insufficient documentation

## 2019-09-30 DIAGNOSIS — Z79899 Other long term (current) drug therapy: Secondary | ICD-10-CM | POA: Insufficient documentation

## 2019-09-30 DIAGNOSIS — E871 Hypo-osmolality and hyponatremia: Secondary | ICD-10-CM | POA: Insufficient documentation

## 2019-09-30 DIAGNOSIS — Z20822 Contact with and (suspected) exposure to covid-19: Secondary | ICD-10-CM | POA: Diagnosis not present

## 2019-09-30 DIAGNOSIS — Z9841 Cataract extraction status, right eye: Secondary | ICD-10-CM | POA: Insufficient documentation

## 2019-09-30 DIAGNOSIS — Z7902 Long term (current) use of antithrombotics/antiplatelets: Secondary | ICD-10-CM | POA: Insufficient documentation

## 2019-09-30 DIAGNOSIS — R197 Diarrhea, unspecified: Secondary | ICD-10-CM | POA: Insufficient documentation

## 2019-09-30 DIAGNOSIS — Z8673 Personal history of transient ischemic attack (TIA), and cerebral infarction without residual deficits: Secondary | ICD-10-CM | POA: Insufficient documentation

## 2019-09-30 DIAGNOSIS — Z91048 Other nonmedicinal substance allergy status: Secondary | ICD-10-CM | POA: Insufficient documentation

## 2019-09-30 DIAGNOSIS — I70219 Atherosclerosis of native arteries of extremities with intermittent claudication, unspecified extremity: Secondary | ICD-10-CM | POA: Insufficient documentation

## 2019-09-30 DIAGNOSIS — M1990 Unspecified osteoarthritis, unspecified site: Secondary | ICD-10-CM | POA: Diagnosis not present

## 2019-09-30 DIAGNOSIS — E785 Hyperlipidemia, unspecified: Secondary | ICD-10-CM | POA: Diagnosis not present

## 2019-09-30 DIAGNOSIS — Z85528 Personal history of other malignant neoplasm of kidney: Secondary | ICD-10-CM | POA: Insufficient documentation

## 2019-09-30 DIAGNOSIS — R112 Nausea with vomiting, unspecified: Secondary | ICD-10-CM | POA: Insufficient documentation

## 2019-09-30 DIAGNOSIS — R001 Bradycardia, unspecified: Secondary | ICD-10-CM | POA: Insufficient documentation

## 2019-09-30 DIAGNOSIS — Z885 Allergy status to narcotic agent status: Secondary | ICD-10-CM | POA: Insufficient documentation

## 2019-09-30 DIAGNOSIS — R0600 Dyspnea, unspecified: Secondary | ICD-10-CM

## 2019-09-30 LAB — CBC
HCT: 28.4 % — ABNORMAL LOW (ref 36.0–46.0)
Hemoglobin: 9.1 g/dL — ABNORMAL LOW (ref 12.0–15.0)
MCH: 28.7 pg (ref 26.0–34.0)
MCHC: 32 g/dL (ref 30.0–36.0)
MCV: 89.6 fL (ref 80.0–100.0)
Platelets: 174 10*3/uL (ref 150–400)
RBC: 3.17 MIL/uL — ABNORMAL LOW (ref 3.87–5.11)
RDW: 15.6 % — ABNORMAL HIGH (ref 11.5–15.5)
WBC: 5.1 10*3/uL (ref 4.0–10.5)
nRBC: 0 % (ref 0.0–0.2)

## 2019-09-30 LAB — BASIC METABOLIC PANEL
Anion gap: 13 (ref 5–15)
BUN: 19 mg/dL (ref 6–20)
CO2: 25 mmol/L (ref 22–32)
Calcium: 8.4 mg/dL — ABNORMAL LOW (ref 8.9–10.3)
Chloride: 93 mmol/L — ABNORMAL LOW (ref 98–111)
Creatinine, Ser: 6.57 mg/dL — ABNORMAL HIGH (ref 0.44–1.00)
GFR calc Af Amer: 7 mL/min — ABNORMAL LOW (ref >60)
GFR calc non Af Amer: 6 mL/min — ABNORMAL LOW (ref >60)
Glucose, Bld: 54 mg/dL — ABNORMAL LOW (ref 70–99)
Potassium: 4.1 mmol/L (ref 3.5–5.1)
Sodium: 131 mmol/L — ABNORMAL LOW (ref 135–145)

## 2019-09-30 LAB — RESPIRATORY PANEL BY RT PCR (FLU A&B, COVID)
Influenza A by PCR: NEGATIVE
Influenza B by PCR: NEGATIVE
SARS Coronavirus 2 by RT PCR: NEGATIVE

## 2019-09-30 LAB — HEMOGLOBIN A1C
Hgb A1c MFr Bld: 5.7 % — ABNORMAL HIGH (ref 4.8–5.6)
Mean Plasma Glucose: 116.89 mg/dL

## 2019-09-30 LAB — GLUCOSE, CAPILLARY
Glucose-Capillary: 61 mg/dL — ABNORMAL LOW (ref 70–99)
Glucose-Capillary: 72 mg/dL (ref 70–99)

## 2019-09-30 LAB — TROPONIN I (HIGH SENSITIVITY)
Troponin I (High Sensitivity): 4 ng/L (ref <18)
Troponin I (High Sensitivity): 3 ng/L (ref <18)

## 2019-09-30 MED ORDER — AMLODIPINE BESYLATE 10 MG PO TABS
10.0000 mg | ORAL_TABLET | Freq: Every day | ORAL | Status: DC
  Administered 2019-09-30: 10 mg via ORAL
  Filled 2019-09-30: qty 2

## 2019-09-30 MED ORDER — SEVELAMER CARBONATE 800 MG PO TABS
2400.0000 mg | ORAL_TABLET | Freq: Three times a day (TID) | ORAL | Status: DC
  Administered 2019-10-01: 2400 mg via ORAL
  Filled 2019-09-30 (×3): qty 3

## 2019-09-30 MED ORDER — LEVOFLOXACIN IN D5W 250 MG/50ML IV SOLN: 250.0000 mg | INTRAVENOUS | Status: DC

## 2019-09-30 MED ORDER — SEVELAMER CARBONATE 800 MG PO TABS: 800.0000 mg | ORAL_TABLET | ORAL | Status: DC

## 2019-09-30 MED ORDER — DIAZEPAM 5 MG PO TABS
5.0000 mg | ORAL_TABLET | Freq: Once | ORAL | Status: AC
  Administered 2019-09-30: 5 mg via ORAL
  Filled 2019-09-30: qty 1

## 2019-09-30 MED ORDER — RENA-VITE PO TABS
1.0000 | ORAL_TABLET | Freq: Every day | ORAL | Status: DC
  Administered 2019-10-01: 1 via ORAL
  Filled 2019-09-30 (×2): qty 1

## 2019-09-30 MED ORDER — SEVELAMER CARBONATE 800 MG PO TABS
800.0000 mg | ORAL_TABLET | ORAL | Status: DC
  Filled 2019-09-30 (×3): qty 1

## 2019-09-30 MED ORDER — FUROSEMIDE 40 MG PO TABS
80.0000 mg | ORAL_TABLET | Freq: Every day | ORAL | Status: DC
  Administered 2019-09-30: 80 mg via ORAL
  Filled 2019-09-30: qty 2

## 2019-09-30 MED ORDER — CLOPIDOGREL BISULFATE 75 MG PO TABS
75.0000 mg | ORAL_TABLET | Freq: Every day | ORAL | Status: DC
  Administered 2019-09-30: 75 mg via ORAL
  Filled 2019-09-30: qty 1

## 2019-09-30 MED ORDER — ASPIRIN EC 81 MG PO TBEC
81.0000 mg | DELAYED_RELEASE_TABLET | Freq: Every day | ORAL | Status: DC
  Administered 2019-09-30: 81 mg via ORAL
  Filled 2019-09-30: qty 1

## 2019-09-30 MED ORDER — ENOXAPARIN SODIUM 30 MG/0.3ML ~~LOC~~ SOLN
30.0000 mg | SUBCUTANEOUS | Status: DC
  Administered 2019-10-01: 30 mg via SUBCUTANEOUS
  Filled 2019-09-30: qty 0.3

## 2019-09-30 MED ORDER — SODIUM CHLORIDE 0.9% FLUSH: 3.0000 mL | Freq: Once | INTRAVENOUS | Status: DC

## 2019-09-30 MED ORDER — FERROUS SULFATE 325 (65 FE) MG PO TABS
325.0000 mg | ORAL_TABLET | Freq: Every day | ORAL | Status: DC
  Filled 2019-09-30: qty 1

## 2019-09-30 MED ORDER — INSULIN ASPART 100 UNIT/ML ~~LOC~~ SOLN: 0.0000 [IU] | Freq: Every day | SUBCUTANEOUS | Status: DC

## 2019-09-30 MED ORDER — CARVEDILOL 3.125 MG PO TABS
3.1250 mg | ORAL_TABLET | Freq: Two times a day (BID) | ORAL | Status: DC
  Administered 2019-10-01: 3.125 mg via ORAL
  Filled 2019-09-30: qty 1

## 2019-09-30 MED ORDER — ATORVASTATIN CALCIUM 20 MG PO TABS
20.0000 mg | ORAL_TABLET | Freq: Every evening | ORAL | Status: DC
  Administered 2019-09-30 – 2019-10-01 (×2): 20 mg via ORAL
  Filled 2019-09-30 (×2): qty 1

## 2019-09-30 MED ORDER — LEVOFLOXACIN IN D5W 500 MG/100ML IV SOLN: 500.0000 mg | INTRAVENOUS | Status: DC

## 2019-09-30 MED ORDER — INSULIN ASPART 100 UNIT/ML ~~LOC~~ SOLN
0.0000 [IU] | Freq: Three times a day (TID) | SUBCUTANEOUS | Status: DC
  Filled 2019-09-30: qty 1

## 2019-09-30 MED ORDER — LEVOFLOXACIN 500 MG PO TABS
500.0000 mg | ORAL_TABLET | Freq: Once | ORAL | Status: AC
  Administered 2019-09-30: 500 mg via ORAL
  Filled 2019-09-30: qty 1

## 2019-09-30 MED ORDER — ALBUTEROL SULFATE HFA 108 (90 BASE) MCG/ACT IN AERS
2.0000 | INHALATION_SPRAY | Freq: Four times a day (QID) | RESPIRATORY_TRACT | Status: DC | PRN
  Filled 2019-09-30: qty 6.7

## 2019-09-30 MED ORDER — NITROGLYCERIN 0.4 MG SL SUBL: 0.4000 mg | SUBLINGUAL_TABLET | SUBLINGUAL | Status: DC | PRN

## 2019-09-30 MED ORDER — FLUOXETINE HCL 20 MG PO CAPS
20.0000 mg | ORAL_CAPSULE | Freq: Every day | ORAL | Status: DC
  Filled 2019-09-30 (×2): qty 1

## 2019-09-30 NOTE — ED Notes (Signed)
IV team consulted- 

## 2019-09-30 NOTE — ED Notes (Signed)
Spoke with son [name] at this time- update provided. Son requesting to speak with doctor- MD notified.

## 2019-09-30 NOTE — ED Notes (Signed)
IV team RN states she was unable to get IV- states to reorder IV team after seven.

## 2019-09-30 NOTE — ED Notes (Signed)
Pt requests to speak w/ family member- pt provided mobile to call.

## 2019-09-30 NOTE — ED Triage Notes (Signed)
Pt states was supposed to have dialysis today, but did not go, was referred by primary doctor due to SOB. EMS reports pt has had increasing SOB x months, suddenly worse yesterday. VSS in triage. Pt 100% on RA.   Pt also c/o chest tightness at this time. Pt states she feels like something is stuck in her throat and won't go down.

## 2019-09-30 NOTE — ED Triage Notes (Signed)
First RN Note: Pt presents to ED via [facility] with c/o SOB x several months, suddenly worse yesterday. EMS states CP with deep breath that is reproducible with palpation.   55-57HR 100% RA 161/64

## 2019-09-30 NOTE — ED Notes (Signed)
Pt desaturated to 85% while asleep- 3 L oxygen applied via [place].

## 2019-09-30 NOTE — ED Notes (Signed)
IV team nurse with pt.

## 2019-09-30 NOTE — ED Notes (Signed)
Pt provided fourth blanket per request.

## 2019-09-30 NOTE — ED Notes (Signed)
Robin Cancel, MD per bed management to request d/c of airborne precautions for negative resp panel. Awaiting response.

## 2019-09-30 NOTE — Progress Notes (Signed)
PIV consult: L arm restricted - dialysis access. Assess R arm with ultrasound. Unable to locate suitable vein. Please consult phlebotomy for lab draw. Pt can be assessed again by IV Team night shift if IV meds are ordered.

## 2019-09-30 NOTE — Progress Notes (Signed)
PHARMACY NOTE:  ANTIMICROBIAL RENAL DOSAGE ADJUSTMENT  Current antimicrobial regimen includes a mismatch between antimicrobial dosage and estimated renal function.  As per policy approved by the Pharmacy & Therapeutics and Medical Executive Committees, the antimicrobial dosage will be adjusted accordingly.  Current antimicrobial dosage:  Levaquin 500mg  IV q24h  Indication: CAP  Renal Function:  Estimated Creatinine Clearance: 10.6 mL/min (A) (by C-G formula based on SCr of 6.57 mg/dL (H)). [x]      On intermittent HD, scheduled: []      On CRRT    Antimicrobial dosage has been changed to:  Levaquin 250mg  IV q48h  Additional comments:   Thank you for allowing pharmacy to be a part of this patient's care.  Paulina Fusi, PharmD, BCPS 09/30/2019 11:32 PM

## 2019-09-30 NOTE — ED Notes (Signed)
Pt provided apple juice.

## 2019-09-30 NOTE — ED Notes (Signed)
Pt is a hard stick- attempted IV without success at this time. Will request ultrasound IV.

## 2019-09-30 NOTE — ED Notes (Signed)
Pt provided phone to call sister per her request.

## 2019-09-30 NOTE — ED Notes (Signed)
Report given to Butch, RN. 

## 2019-09-30 NOTE — ED Notes (Signed)
Pt's O2 rechecked by Janett Billow, RN. Pt calling out intermittently when she awakens that she can't breathe. Pt stated to Janett Billow, RN that she was supposed to be put on O2 when she got back from X-ray. O2 sats 98% on RA at this time. Pt visualized intermittently asleep in the lobby.

## 2019-09-30 NOTE — ED Notes (Signed)
Assumed care at this time

## 2019-09-30 NOTE — ED Notes (Addendum)
Gwynneth Albright, MD signed off- Joellyn Quails, MD to request d/c of airborne precautions- MD discontinued per request.

## 2019-09-30 NOTE — ED Notes (Signed)
IV team with pt.

## 2019-09-30 NOTE — H&P (Addendum)
History and Physical    [name] IOM:[id number] DOB: 1961/02/20 DOA: 09/30/2019  PCP: Freddy Finner, NP  Patient coming from: home   Chief Complaint: sob  HPI: [name] is a 59 y.o. female with medical history significant  of diabetes mellitus type 2, hypertension, hyperlipidemia, ESRD on hemodialysis resents complaining of shortness of breath.  He does note that patient is somewhat a poor historian as she keeps falling asleep during my history taking.  She reports are not she has been having shortness of breath but today was worse.  She was unable to get her dialysis done today.  She does state at times she misses dialysis on certain days but makes up for the following day.  Denies any chest pain, reports chronic on and off diarrhea, no fever or chills. ED Course: Oxygen saturation was 100% room air.  Chest x-ray revealed possible small focus of pneumonia in the lateral right base.  Was given levofloxacin by ER 500 mg p.o. with hopes of discharging her home but nephrology wanted to observe patient so patient can have hemodialysis in a.m.  Also found with sodium of 131 which is chronic.  Her blood pressure in ED is 169-176/76.  Patient reports she takes her medications for her blood pressure in the evenings.  Denies any headaches or vision changes.  Review of Systems: All systems reviewed and otherwise negative.    Past Medical History:  Diagnosis Date  . Anemia   . Anginal pain ([place])   . Anxiety   . Arthritis   . Asthma   . Broken wrist   . Bronchitis   . chronic diastolic CHF 8/45/3646  . COPD (chronic obstructive pulmonary disease) ([place])   . Coronary artery disease    a. cath 2013: stenting to RCA (report not available); b. cath 2014: LM nl, pLAD 40%, mLAD nl, ost LCx 40%, mid LCx nl, pRCA 30% @ site of prior stent, mRCA 50%  . Depression   . Diabetes mellitus without complication ([place])   . Diabetic neuropathy ([place])   . dialysis 2006  . Diverticulosis   . Dizziness   .  Dyspnea   . Elevated lipids   . Environmental and seasonal allergies   . ESRD (end stage renal disease) on dialysis ([place])    M-W-F  . GERD (gastroesophageal reflux disease)   . Headache   . History of hiatal hernia   . HOH (hard of hearing)   . Hx of pancreatitis 2015  . Hypertension   . Lower extremity edema   . Mitral regurgitation    a. echo 10/2013: EF 62%, noWMA, mildly dilated LA, mild to mod MR/TR, GR1DD  . Myocardial infarction ([place])   . Orthopnea   . Pneumonia   . Renal cancer ([place])   . Renal insufficiency    Pt is on dialysis on M,W + F.  . Wheezing     Past Surgical History:  Procedure Laterality Date  . A/V SHUNTOGRAM Left 01/20/2018   Procedure: A/V SHUNTOGRAM;  Surgeon: Algernon Huxley, MD;  Location: [place] [facility];  Service: Cardiovascular;  Laterality: Left;  . ABDOMINAL HYSTERECTOMY  1992  . AMPUTATION TOE Left 10/02/2017   Procedure: AMPUTATION TOE-LEFT GREAT TOE;  Surgeon: Albertine Patricia, DPM;  Location: [facility];  Service: Podiatry;  Laterality: Left;  . APPENDECTOMY    . ARTERY BIOPSY Right 10/11/2018   Procedure: BIOPSY TEMPORAL ARTERY;  Surgeon: Vickie Epley, MD;  Location: [facility];  Service: General;  Laterality: Right;  . CARDIAC CATHETERIZATION Left 07/26/2015   Procedure: Left Heart Cath and Coronary Angiography;  Surgeon: Dionisio David, MD;  Location: [place] [facility];  Service: Cardiovascular;  Laterality: Left;  . CATARACT EXTRACTION W/ INTRAOCULAR LENS IMPLANT Right   . CATARACT EXTRACTION W/PHACO Left 03/10/2017   Procedure: CATARACT EXTRACTION PHACO AND INTRAOCULAR LENS PLACEMENT (IOC);  Surgeon: Birder Robson, MD;  Location: [facility];  Service: Ophthalmology;  Laterality: Left;  [place] 00:51.9 AP% 14.2 CDE 7.39 fluid pack lot # [id number]  . CHOLECYSTECTOMY    . COLONOSCOPY WITH PROPOFOL N/A 08/12/2016   Procedure: COLONOSCOPY WITH PROPOFOL;  Surgeon: Lollie Sails, MD;  Location: [facility];  Service: Endoscopy;   Laterality: N/A;  . DIALYSIS FISTULA CREATION Left    upper arm  . dialysis grafts    . ESOPHAGOGASTRODUODENOSCOPY N/A 03/08/2015   Procedure: ESOPHAGOGASTRODUODENOSCOPY (EGD);  Surgeon: Manya Silvas, MD;  Location: [facility] ENDOSCOPY;  Service: Endoscopy;  Laterality: N/A;  . ESOPHAGOGASTRODUODENOSCOPY (EGD) WITH PROPOFOL N/A 03/18/2016   Procedure: ESOPHAGOGASTRODUODENOSCOPY (EGD) WITH PROPOFOL;  Surgeon: [name], MD;  Location: [facility] ENDOSCOPY;  Service: Endoscopy;  Laterality: N/A;  . EYE SURGERY Right 2018  . FECAL TRANSPLANT N/A 08/23/2015   Procedure: FECAL TRANSPLANT;  Surgeon: Manya Silvas, MD;  Location: [facility];  Service: Endoscopy;  Laterality: N/A;  . HAND SURGERY Bilateral   . IR RADIOLOGIST EVAL & MGMT  07/28/2019  . IR RADIOLOGIST EVAL & MGMT  08/11/2019  . PERIPHERAL VASCULAR CATHETERIZATION N/A 12/20/2015   Procedure: Thrombectomy of dialysis access versus permcath placement;  Surgeon: Algernon Huxley, MD;  Location: [place] [facility];  Service: Cardiovascular;  Laterality: N/A;  . PERIPHERAL VASCULAR CATHETERIZATION N/A 12/20/2015   Procedure: A/V Shunt Intervention;  Surgeon: Algernon Huxley, MD;  Location: [place] [facility];  Service: Cardiovascular;  Laterality: N/A;  . PERIPHERAL VASCULAR CATHETERIZATION N/A 12/20/2015   Procedure: A/V Shuntogram/Fistulagram;  Surgeon: Algernon Huxley, MD;  Location: [place] [facility];  Service: Cardiovascular;  Laterality: N/A;  . PERIPHERAL VASCULAR CATHETERIZATION N/A 01/02/2016   Procedure: A/V Shuntogram/Fistulagram;  Surgeon: Algernon Huxley, MD;  Location: [place] [facility];  Service: Cardiovascular;  Laterality: N/A;  . PERIPHERAL VASCULAR CATHETERIZATION N/A 01/02/2016   Procedure: A/V Shunt Intervention;  Surgeon: Algernon Huxley, MD;  Location: [place] [facility];  Service: Cardiovascular;  Laterality: N/A;     reports that she quit smoking about 4 years ago. Her smoking use included cigarettes. She has a 20.00 pack-year  smoking history. She has never used smokeless tobacco. She reports previous alcohol use. She reports current drug use. Drug: Marijuana.  Allergies  Allergen Reactions  . Ace Inhibitors Swelling and Anaphylaxis  . Ativan [Lorazepam] Other (See Comments)    Reaction:Hallucinations and headaches  . Compazine [Prochlorperazine Edisylate] Anaphylaxis, Nausea And Vomiting and Other (See Comments)    Other reaction(s): dystonia from this vs. Reglan, 23 Jul - patient relates that she takes promethazine frequently with no problems  . Sumatriptan Succinate Other (See Comments)    Other reaction(s): delirium and hallucinations per [facility] records  . Dilaudid [Hydromorphone Hcl] Other (See Comments)    Delirium   . Ondansetron Other (See Comments)    hallucinations    . Zofran [Ondansetron Hcl] Other (See Comments)    Reaction:  hallucinations   . Codeine Nausea And Vomiting  . Gabapentin Other (See Comments)    Reaction:  Unknown    . [place] Nausea  And Vomiting  . Losartan Nausea Only  . Oxycodone Anxiety  . Prochlorperazine Other (See Comments)    Reaction:  Unknown . Patient does not remember reaction but she does have vertigo and anxiety along with n and v at times. Could be used to treat any of these   . Reglan [Metoclopramide] Other (See Comments)    Per patient her Dr. Evelina Bucy her off it   . Scopolamine Other (See Comments)    Dizziness, also has vertigo already  . Tape Rash  . Tapentadol Rash    Family History  Problem Relation Age of Onset  . Kidney disease Mother   . Diabetes Mother   . Cancer Father   . Kidney disease Sister      Prior to Admission medications   Medication Sig Start Date End Date Taking? Authorizing Provider  amLODipine (NORVASC) 10 MG tablet Take 10 mg by mouth daily.   Yes [provider]  atorvastatin (LIPITOR) 20 MG tablet Take 20 mg by mouth every evening.  12/02/16  Yes [provider]  carvedilol (COREG) 3.125 MG tablet Take 1  tablet (3.125 mg total) by mouth 2 (two) times daily with a meal. 10/11/18  Yes Epifanio Lesches, MD  albuterol (PROVENTIL HFA;VENTOLIN HFA) 108 (90 Base) MCG/ACT inhaler Inhale 2 puffs into the lungs every 6 (six) hours as needed for wheezing or shortness of breath. 02/17/18   Merlyn Lot, MD  albuterol (PROVENTIL) (2.5 MG/3ML) 0.083% nebulizer solution Take 2.5 mg by nebulization every 4 (four) hours as needed for wheezing or shortness of breath.    [provider]  aspirin EC 81 MG tablet Take 81 mg by mouth daily. 09/08/16   [provider]  Biotin 1 MG CAPS Take 1 mg by mouth daily.    [provider]  cetirizine (ZYRTEC) 10 MG tablet Take 10 mg by mouth daily.     [provider]  chlorpheniramine-HYDROcodone (TUSSIONEX) 10-8 MG/5ML SUER Take 5 mLs by mouth every 12 (twelve) hours. 10/11/18   Epifanio Lesches, MD  cholecalciferol (VITAMIN D) 400 units TABS tablet Take 400 Units by mouth daily.    [provider]  clopidogrel (PLAVIX) 75 MG tablet Take 75 mg by mouth daily. 07/01/19   [provider]  cyanocobalamin 1000 MCG tablet Take 1,000 mcg by mouth daily.     [provider]  cyclobenzaprine (FLEXERIL) 10 MG tablet Take 1 tablet (10 mg total) by mouth 3 (three) times daily as needed. 11/22/18   Sable Feil, PA-C  diclofenac sodium (VOLTAREN) 1 % GEL Apply 1 application topically daily as needed (for rash).    [provider]  ferrous sulfate ([employer] IRON) 325 (65 FE) MG tablet Take 325 mg by mouth daily with breakfast.    [provider]  FLUoxetine (PROZAC) 20 MG capsule Take 1 capsule (20 mg total) by mouth daily. Patient taking differently: Take 20 mg by mouth at bedtime.  04/10/16   Hower, Aaron Mose, MD  furosemide (LASIX) 80 MG tablet Take 80 mg by mouth daily. 07/09/19   [provider]  hydrocortisone cream 0.5 % Apply topically 3 (three) times daily. 01/21/18   Wieting, Richard, MD    insulin aspart (NOVOLOG FLEXPEN) 100 UNIT/ML FlexPen Inject 4-15 Units into the skin 4 (four) times daily as needed for high blood sugar (> 100, per sliding scale).    [provider]  lipase/protease/amylase (CREON) 12000 UNITS CPEP capsule Take 36000 mg in the morning, 24000 midday &  36000 in the evening    [provider]  LORazepam (ATIVAN PO) Take by mouth.    [provider]  multivitamin (RENA-VIT) TABS tablet Take 1 tablet by mouth daily.     [provider]  nitroGLYCERIN (NITROSTAT) 0.4 MG SL tablet Place 0.4 mg under the tongue every 5 (five) minutes as needed for chest pain.     [provider]  predniSONE (DELTASONE) 10 MG tablet Take 6 tablets (60 mg total) by mouth daily with breakfast. Need to follow up with pcp in 3-5 days regarding biopsy results from temporal artery and decide wether to continue high dose prednsione or not 10/11/18   Epifanio Lesches, MD  ranitidine (ZANTAC) 300 MG capsule Take 1 capsule by mouth every evening. 01/19/18   [provider]  sevelamer carbonate (RENVELA) 800 MG tablet Take 800-2,400 mg by mouth See admin instructions. Take 2400 mg by mouth 3 times daily with meals and take 800 mg by mouth with snacks    [provider]    Physical Exam: Vitals:   09/30/19 1445 09/30/19 1500 09/30/19 1515 09/30/19 1530  BP: (!) 185/62  (!) 176/61 (!) 169/70  Pulse: (!) 55 (!) 52 (!) 52 (!) 53  Resp:  17 14   Temp:      TempSrc:      SpO2: 100% 100% 100% 99%  Weight:      Height:        Constitutional: NAD, calm, comfortable Vitals:   09/30/19 1445 09/30/19 1500 09/30/19 1515 09/30/19 1530  BP: (!) 185/62  (!) 176/61 (!) 169/70  Pulse: (!) 55 (!) 52 (!) 52 (!) 53  Resp:  17 14   Temp:      TempSrc:      SpO2: 100% 100% 100% 99%  Weight:      Height:       Eyes: EOMI ENMT: wearing mask  Neck: normal, supple Respiratory: clear to auscultation bilaterally, no wheezing, no crackles.  Normal respiratory effort. No accessory muscle use.  Cardiovascular: Regular rate and rhythm, no murmurs / rubs / gallops. Abdomen: no tenderness, no masses palpated. No hepatosplenomegaly. Bowel sounds positive.   Lower extremities. + b/l edema, chronic skin changes Skin: Warm dry Neurologic: CN 2-12 grossly intact. Psychiatric: Normal judgment and insight. Alert and oriented x 3. Normal mood.    Labs on Admission: I have personally reviewed following labs and imaging studies  CBC: Recent Labs  Lab 09/30/19 1100  WBC 5.1  HGB 9.1*  HCT 28.4*  MCV 89.6  PLT 765   Basic Metabolic Panel: Recent Labs  Lab 09/30/19 1100  NA 131*  K 4.1  CL 93*  CO2 25  GLUCOSE 54*  BUN 19  CREATININE 6.57*  CALCIUM 8.4*   GFR: Estimated Creatinine Clearance: 10.6 mL/min (A) (by C-G formula based on SCr of 6.57 mg/dL (H)). Liver Function Tests: No results for input(s): AST, ALT, ALKPHOS, BILITOT, PROT, ALBUMIN in the last 168 hours. No results for input(s): LIPASE, AMYLASE in the last 168 hours. No results for input(s): AMMONIA in the last 168 hours. Coagulation Profile: No results for input(s): INR, PROTIME in the last 168 hours. Cardiac Enzymes: No results for input(s): CKTOTAL, CKMB, CKMBINDEX, TROPONINI in the last 168 hours. BNP (last 3 results) No results for input(s): PROBNP in the last 8760 hours. HbA1C: No results for input(s): HGBA1C in the last 72 hours. CBG: No results for input(s): GLUCAP in the last 168 hours. Lipid Profile: No results  for input(s): CHOL, HDL, LDLCALC, TRIG, CHOLHDL, LDLDIRECT in the last 72 hours. Thyroid Function Tests: No results for input(s): TSH, T4TOTAL, FREET4, T3FREE, THYROIDAB in the last 72 hours. Anemia Panel: No results for input(s): VITAMINB12, FOLATE, FERRITIN, TIBC, IRON, RETICCTPCT in the last 72 hours. Urine analysis:    Component Value Date/Time   COLORURINE YELLOW (A) 06/09/2017 1039   APPEARANCEUR CLEAR (A) 06/09/2017 1039    APPEARANCEUR Clear 09/15/2014 0947   LABSPEC 1.014 06/09/2017 1039   LABSPEC 1.008 09/15/2014 0947   PHURINE 8.0 06/09/2017 1039   GLUCOSEU 150 (A) 06/09/2017 1039   GLUCOSEU 150 mg/dL 09/15/2014 0947   HGBUR SMALL (A) 06/09/2017 1039   BILIRUBINUR NEGATIVE 06/09/2017 1039   BILIRUBINUR Negative 09/15/2014 [place] 06/09/2017 1039   PROTEINUR >=300 (A) 06/09/2017 1039   UROBILINOGEN 0.2 06/30/2010 2130   NITRITE NEGATIVE 06/09/2017 1039   LEUKOCYTESUR NEGATIVE 06/09/2017 1039   LEUKOCYTESUR Negative 09/15/2014 0947    Radiological Exams on Admission: DG Chest 2 View  Result Date: 09/30/2019 CLINICAL DATA:  Chest pain and shortness of breath.  Renal failure EXAM: CHEST - 2 VIEW COMPARISON:  October 09, 2018 FINDINGS: There is ill-defined airspace opacity in the lateral right base. No other airspace opacity evident. There remains interstitial prominence with questionable interstitial edema. There is cardiomegaly with mild pulmonary venous hypertension. No adenopathy. Extensive stent from the left subclavian to the left brachial region noted. No bone lesions. No pneumothorax. IMPRESSION: Suspect small focus of pneumonia in the lateral right base. No other airspace opacity evident. Stable cardiomegaly with a degree of pulmonary vascular congestion. Interstitial prominence may represent mild chronic interstitial edema. This appearance is stable from 1 year prior. Extensive stent placement on the left. Electronically Signed   By: Lowella Grip III M.D.   On: 09/30/2019 12:19   VAS US DUPLEX DIALYSIS ACCESS (AVF, AVG)  Result Date: 09/29/2019 DIALYSIS ACCESS Reason for Exam: Routine follow up. Access Site: Left Upper Extremity. Access Type: Brachial-axillary AVG. History: 08/24/13: Left brachial-axillary AVG placement;          H/O AVG & outflow vein stents as well as DRIL;. Performing Technologist: Blondell Reveal RT, RDMS, RVT  Examination Guidelines: A complete evaluation  includes B-mode imaging, spectral Doppler, color Doppler, and power Doppler as needed of all accessible portions of each vessel. Unilateral testing is considered an integral part of a complete examination. Limited examinations for reoccurring indications may be performed as noted.  Findings:  +--------------------+----------+-----------------+--------+ AVG                 PSV (cm/s)Flow Vol (mL/min)Describe +--------------------+----------+-----------------+--------+ Native artery inflow   232          1251                +--------------------+----------+-----------------+--------+ Arterial anastomosis   459                              +--------------------+----------+-----------------+--------+ Prox graft             307                      stent   +--------------------+----------+-----------------+--------+ Mid graft              281                      stent   +--------------------+----------+-----------------+--------+ Distal graft  225                              +--------------------+----------+-----------------+--------+ Venous anastomosis     370                      stent   +--------------------+----------+-----------------+--------+ Venous outflow         142                      stent   +--------------------+----------+-----------------+--------+ Antegrade, biphasic flow in the left distal radial artery with velocities of 98cm/s at rest and 105cm/s during access compression. No significant velocity increase consistent with significant access steal. Patent DRIL graft noted.  Summary: Patent left brachial-axillary AVG with a normal Flow Volume and no significant stenosis.  *See table(s) above for measurements and observations.  Diagnosing physician: Hortencia Pilar MD Electronically signed by Hortencia Pilar MD on 09/29/2019 at 5:21:36 PM.   --------------------------------------------------------------------------------   Final    VAS US  CAROTID  Result Date: 09/29/2019 Carotid Arterial Duplex Study Indications:   Carotid artery disease. Other Factors: Current left arm AVG. Performing Technologist: Blondell Reveal RT, RDMS, RVT  Examination Guidelines: A complete evaluation includes B-mode imaging, spectral Doppler, color Doppler, and power Doppler as needed of all accessible portions of each vessel. Bilateral testing is considered an integral part of a complete examination. Limited examinations for reoccurring indications may be performed as noted.  Right Carotid Findings: +----------+--------+--------+--------+------------------+-----------------+           PSV cm/sEDV cm/sStenosisPlaque DescriptionComments          +----------+--------+--------+--------+------------------+-----------------+ CCA Prox  81      0                                                   +----------+--------+--------+--------+------------------+-----------------+ CCA Mid   73      11                                                  +----------+--------+--------+--------+------------------+-----------------+ CCA Distal51      10              heterogenous                        +----------+--------+--------+--------+------------------+-----------------+ ICA Prox  434     87      80-99%  heterogenous      ICA/CCA ratio=5.9 +----------+--------+--------+--------+------------------+-----------------+ ICA Mid   174     29                                                  +----------+--------+--------+--------+------------------+-----------------+ ICA Distal98      16                                                  +----------+--------+--------+--------+------------------+-----------------+ ECA  127     0               heterogenous                        +----------+--------+--------+--------+------------------+-----------------+ +----------+--------+-------+----------------+-------------------+           PSV cm/sEDV  cmsDescribe        Arm Pressure (mmHG) +----------+--------+-------+----------------+-------------------+ [id number]            Multiphasic, WNL                    +----------+--------+-------+----------------+-------------------+ +---------+--------+--+--------+---------+ VertebralPSV cm/s87EDV cm/sAntegrade +---------+--------+--+--------+---------+  Left Carotid Findings: +----------+--------+--------+--------+------------------+-----------------+           PSV cm/sEDV cm/sStenosisPlaque DescriptionComments          +----------+--------+--------+--------+------------------+-----------------+ CCA Prox  87      10                                                  +----------+--------+--------+--------+------------------+-----------------+ CCA Mid   69      11                                                  +----------+--------+--------+--------+------------------+-----------------+ CCA Distal55      11                                                  +----------+--------+--------+--------+------------------+-----------------+ ICA Prox  213     43      40-59%  heterogenous      ICA/CCA ratio=3.1 +----------+--------+--------+--------+------------------+-----------------+ ICA Mid   124     24                                                  +----------+--------+--------+--------+------------------+-----------------+ ICA Distal71      18                                                  +----------+--------+--------+--------+------------------+-----------------+ ECA       103     0                                                   +----------+--------+--------+--------+------------------+-----------------+ +----------+--------+--------+-------------------------+-------------------+           PSV cm/sEDV cm/sDescribe                 Arm Pressure (mmHG) +----------+--------+--------+-------------------------+-------------------+  [id number]             Monophasic, fistular flow                    +----------+--------+--------+-------------------------+-------------------+ +---------+--------+--+--------+---------+ VertebralPSV cm/s57EDV cm/sAntegrade +---------+--------+--+--------+---------+  Summary: Right Carotid: Velocities in the right ICA are consistent with a 80-99%                stenosis. Left Carotid: Velocities in the left ICA are consistent with a 40-59% stenosis.                No significant change noted when compared to the previous exam on               01/07/18. *See table(s) above for measurements and observations. Electronically signed by Hortencia Pilar MD on 09/29/2019 at 5:21:32 PM.    Final     EKG: Independently reviewed.  Sinus bradycardia with no acute ST changes  Assessment/Plan Active Problems:   Pneumonia    1. CAP- will continue levaquin iv 2. ESRD- on dialysis.  Did not have dialysis today (MWF) Nephrology consulted and plan for dialysis in a.m. 3.  Hyponatremia-appears to be chronic based on previous sodium levels 4.  Essential hypertension-currently uncontrolled.  Asymptomatic.  She takes her medications in p.m. we will resume her home meds. 5.  CAD-continue aspirin, Plavix, statin, beta blk 6. DM 2- hold home meds. Ck fs, riss  DVT prophylaxis: Lovenox Code Status: Full Family Communication: None at bedside Disposition Plan: Hemodialysis in a.m. and discharge home with p.o. antibiotics Consults called: Nephrology Admission status: Observation as patient requires less than 2 midnight stays   Nolberto Hanlon MD [facility] Pager [phone]   If 7PM-7AM, please contact night-coverage [email or web address] Password [facility]  09/30/2019, 5:38 PM

## 2019-09-30 NOTE — ED Notes (Signed)
Pt c/o increased SOB. Pt sat up to 90 degree angle. Pulse oximetry reading 99% on 2 Liters [place]. Pt updated on admission status/process.

## 2019-09-30 NOTE — ED Provider Notes (Signed)
[facility] Emergency Department Provider Note       Time seen: ----------------------------------------- 3:24 PM on 09/30/2019 ----------------------------------------- I have reviewed the triage vital signs and the nursing notes.  HISTORY   Chief Complaint Shortness of Breath   HPI [name] is a 59 y.o. female with a history of anemia, angina, arthritis, bronchitis, CHF, COPD, coronary disease, diabetes, end-stage renal disease on dialysis who presents to the ED for shortness of breath.  Patient was placed at dialysis today but she states she did not feel well enough to go.  She has had increased shortness of breath that was suddenly worse today.  Oxygen saturations were 100% on room air, also complaining of chest tightness.  Past Medical History:  Diagnosis Date  . Anemia   . Anginal pain ([place])   . Anxiety   . Arthritis   . Asthma   . Broken wrist   . Bronchitis   . chronic diastolic CHF 6/43/3295  . COPD (chronic obstructive pulmonary disease) ([place])   . Coronary artery disease    a. cath 2013: stenting to RCA (report not available); b. cath 2014: LM nl, pLAD 40%, mLAD nl, ost LCx 40%, mid LCx nl, pRCA 30% @ site of prior stent, mRCA 50%  . Depression   . Diabetes mellitus without complication ([place])   . Diabetic neuropathy ([place])   . dialysis 2006  . Diverticulosis   . Dizziness   . Dyspnea   . Elevated lipids   . Environmental and seasonal allergies   . ESRD (end stage renal disease) on dialysis ([place])    M-W-F  . GERD (gastroesophageal reflux disease)   . Headache   . History of hiatal hernia   . HOH (hard of hearing)   . Hx of pancreatitis 2015  . Hypertension   . Lower extremity edema   . Mitral regurgitation    a. echo 10/2013: EF 62%, noWMA, mildly dilated LA, mild to mod MR/TR, GR1DD  . Myocardial infarction ([place])   . Orthopnea   . Pneumonia   . Renal cancer ([place])   . Renal insufficiency    Pt is on dialysis on M,W + F.  .  Wheezing     Patient Active Problem List   Diagnosis Date Noted  . Atherosclerosis of native arteries of extremity with intermittent claudication ([place]) 05/01/2019  . Right-sided headache   . COPD (chronic obstructive pulmonary disease) ([place]) 10/06/2018  . Syncope 10/04/2018  . Symptomatic anemia 06/18/2018  . Gastroparesis due to DM ([place]) 01/18/2018  . Complication of vascular access for dialysis 12/04/2017  . Osteomyelitis ([place]) 09/30/2017  . Carotid stenosis 06/18/2017  . Shortness of breath 05/04/2017  . Cellulitis of lower extremity 07/29/2016  . Chronic venous insufficiency 07/29/2016  . Lymphedema 07/29/2016  . TIA (transient ischemic attack) 04/21/2016  . Altered mental status 04/08/2016  . Hyperammonemia ([place]) 04/08/2016  . Elevated troponin 04/08/2016  . Depression 04/08/2016  . Depression, major, recurrent, severe with psychosis ([place]) 04/08/2016  . Blood in stool   . Intractable cyclical vomiting with nausea   . Reflux esophagitis   . Gastritis   . Generalized abdominal pain   . Uncontrollable vomiting   . Major depressive disorder, recurrent episode, moderate ([place]) 03/15/2016  . Adjustment disorder with mixed anxiety and depressed mood 03/15/2016  . Malnutrition of moderate degree 12/01/2015  . Renal mass   . Dyspnea   . Acute renal failure ([place])   . Respiratory failure ([place])   .  High temperature 11/14/2015  . Pulmonary edema   . Encounter for central line placement   . Encounter for orogastric (OG) tube placement   . Nausea 11/12/2015  . Hyperkalemia 10/03/2015  . Diarrhea, unspecified 07/22/2015  . Pneumonia 05/21/2015  . Hypoglycemia 04/24/2015  . Unresponsiveness 04/24/2015  . Bradycardia 04/24/2015  . Hypothermia 04/24/2015  . Acute respiratory failure ([place]) 04/24/2015  . Acute diastolic CHF (congestive heart failure) ([place]) 04/05/2015  . Diabetic gastroparesis ([place]) 04/05/2015  . Hypokalemia 04/05/2015  . Generalized weakness 04/05/2015  . Acute  pulmonary edema ([place]) 04/03/2015  . Nausea and vomiting 04/03/2015  . Hypoglycemia associated with diabetes ([place]) 04/03/2015  . Anemia of chronic disease 04/03/2015  . Secondary hyperparathyroidism ([place]) 04/03/2015  . Pressure ulcer 04/02/2015  . Acute respiratory failure with hypoxia ([place] ) 04/01/2015  . Adjustment disorder with anxiety 03/14/2015  . Somatic symptom disorder, mild 03/08/2015  . Coronary artery disease involving native coronary artery of native heart without angina pectoris   . Nausea & vomiting 03/06/2015  . Abdominal pain 03/06/2015  . DM (diabetes mellitus) ([place]) 03/06/2015  . HTN (hypertension) 03/06/2015  . Gastroparesis 02/24/2015  . Pleural effusion 02/19/2015  . HCAP (healthcare-associated pneumonia) 02/19/2015  . End-stage renal disease on hemodialysis ([place]) 02/19/2015    Past Surgical History:  Procedure Laterality Date  . A/V SHUNTOGRAM Left 01/20/2018   Procedure: A/V SHUNTOGRAM;  Surgeon: Algernon Huxley, MD;  Location: [place] [facility];  Service: Cardiovascular;  Laterality: Left;  . ABDOMINAL HYSTERECTOMY  1992  . AMPUTATION TOE Left 10/02/2017   Procedure: AMPUTATION TOE-LEFT GREAT TOE;  Surgeon: Albertine Patricia, DPM;  Location: [facility] ORS;  Service: Podiatry;  Laterality: Left;  . APPENDECTOMY    . ARTERY BIOPSY Right 10/11/2018   Procedure: BIOPSY TEMPORAL ARTERY;  Surgeon: Vickie Epley, MD;  Location: [facility] ORS;  Service: General;  Laterality: Right;  . CARDIAC CATHETERIZATION Left 07/26/2015   Procedure: Left Heart Cath and Coronary Angiography;  Surgeon: Dionisio David, MD;  Location: [place] [facility];  Service: Cardiovascular;  Laterality: Left;  . CATARACT EXTRACTION W/ INTRAOCULAR LENS IMPLANT Right   . CATARACT EXTRACTION W/PHACO Left 03/10/2017   Procedure: CATARACT EXTRACTION PHACO AND INTRAOCULAR LENS PLACEMENT (IOC);  Surgeon: Birder Robson, MD;  Location: [facility] ORS;  Service: Ophthalmology;  Laterality: Left;  [place] 00:51.9 AP%  14.2 CDE 7.39 fluid pack lot # [id number]  . CHOLECYSTECTOMY    . COLONOSCOPY WITH PROPOFOL N/A 08/12/2016   Procedure: COLONOSCOPY WITH PROPOFOL;  Surgeon: Lollie Sails, MD;  Location: [facility]COPY;  Service: Endoscopy;  Laterality: N/A;  . DIALYSIS FISTULA CREATION Left    upper arm  . dialysis grafts    . ESOPHAGOGASTRODUODENOSCOPY N/A 03/08/2015   Procedure: ESOPHAGOGASTRODUODENOSCOPY (EGD);  Surgeon: Manya Silvas, MD;  Location: [facility] ENDOSCOPY;  Service: Endoscopy;  Laterality: N/A;  . ESOPHAGOGASTRODUODENOSCOPY (EGD) WITH PROPOFOL N/A 03/18/2016   Procedure: ESOPHAGOGASTRODUODENOSCOPY (EGD) WITH PROPOFOL;  Surgeon: [name], MD;  Location: [facility] ENDOSCOPY;  Service: Endoscopy;  Laterality: N/A;  . EYE SURGERY Right 2018  . FECAL TRANSPLANT N/A 08/23/2015   Procedure: FECAL TRANSPLANT;  Surgeon: Manya Silvas, MD;  Location: [facility] ENDOSCOPY;  Service: Endoscopy;  Laterality: N/A;  . HAND SURGERY Bilateral   . IR RADIOLOGIST EVAL & MGMT  07/28/2019  . IR RADIOLOGIST EVAL & MGMT  08/11/2019  . PERIPHERAL VASCULAR CATHETERIZATION N/A 12/20/2015   Procedure: Thrombectomy of dialysis access versus permcath placement;  Surgeon: Algernon Huxley, MD;  Location:  [place] [facility];  Service: Cardiovascular;  Laterality: N/A;  . PERIPHERAL VASCULAR CATHETERIZATION N/A 12/20/2015   Procedure: A/V Shunt Intervention;  Surgeon: Algernon Huxley, MD;  Location: [place] [facility];  Service: Cardiovascular;  Laterality: N/A;  . PERIPHERAL VASCULAR CATHETERIZATION N/A 12/20/2015   Procedure: A/V Shuntogram/Fistulagram;  Surgeon: Algernon Huxley, MD;  Location: [place] [facility];  Service: Cardiovascular;  Laterality: N/A;  . PERIPHERAL VASCULAR CATHETERIZATION N/A 01/02/2016   Procedure: A/V Shuntogram/Fistulagram;  Surgeon: Algernon Huxley, MD;  Location: [place] [facility];  Service: Cardiovascular;  Laterality: N/A;  . PERIPHERAL VASCULAR CATHETERIZATION N/A 01/02/2016   Procedure: A/V Shunt  Intervention;  Surgeon: Algernon Huxley, MD;  Location: [place] [facility];  Service: Cardiovascular;  Laterality: N/A;    Allergies Ace inhibitors, Ativan [lorazepam], Compazine [prochlorperazine edisylate], Sumatriptan succinate, Dilaudid [hydromorphone hcl], Ondansetron, Zofran [ondansetron hcl], Codeine, Gabapentin, [place] bovis, Losartan, Oxycodone, Prochlorperazine, Reglan [metoclopramide], Scopolamine, Tape, and Tapentadol  Social History Social History   Tobacco Use  . Smoking status: Former Smoker    Packs/day: 0.50    Years: 40.00    Pack years: 20.00    Types: Cigarettes    Quit date: 02/13/2015    Years since quitting: 4.6  . Smokeless tobacco: Never Used  Substance Use Topics  . Alcohol use: Not Currently    Comment: glass wine week per pt  . Drug use: Yes    Types: Marijuana    Comment: once a day   Review of Systems Constitutional: Negative for fever. Cardiovascular: Negative for chest pain. Respiratory: Positive for shortness of breath Gastrointestinal: Negative for abdominal pain, vomiting and diarrhea. Musculoskeletal: Negative for back pain. Skin: Negative for rash. Neurological: Negative for headaches, focal weakness or numbness.  All systems negative/normal/unremarkable except as stated in the HPI  ____________________________________________   PHYSICAL EXAM:  VITAL SIGNS: ED Triage Vitals  Enc Vitals Group     BP 09/30/19 1052 (!) 138/56     Pulse Rate 09/30/19 1052 (!) 56     Resp 09/30/19 1052 (!) 22     Temp 09/30/19 1052 97.6 F (36.4 C)     Temp Source 09/30/19 1052 Oral     SpO2 09/30/19 1052 100 %     Weight 09/30/19 1053 196 lb 3.2 oz (89 kg)     Height 09/30/19 1053 5\' 8"  (1.727 m)     Head Circumference --      Peak Flow --      Pain Score 09/30/19 1052 7     Pain Loc --      Pain Edu? --      Excl. in [place]? --    Constitutional: Alert and oriented. Well appearing and in no distress. Eyes: Conjunctivae are normal. Normal extraocular  movements. Cardiovascular: Normal rate, regular rhythm. No murmurs, rubs, or gallops. Respiratory: Normal respiratory effort without tachypnea nor retractions. Breath sounds are clear and equal bilaterally. No wheezes/rales/rhonchi. Gastrointestinal: Soft and nontender. Normal bowel sounds Musculoskeletal: Nontender with normal range of motion in extremities. No lower extremity tenderness nor edema. Neurologic:  Normal speech and language. No gross focal neurologic deficits are appreciated.  Skin:  Skin is warm, dry and intact. No rash noted. Psychiatric: Mood and affect are normal. Speech and behavior are normal.  ____________________________________________  EKG: Interpreted by me.  Sinus bradycardia with rate of 52 bpm, normal PR interval, normal QRS size, normal QT  ____________________________________________  ED COURSE:  As part of my medical decision making,  I reviewed the following data within the [place] History obtained from family if available, nursing notes, old chart and ekg, as well as notes from prior ED visits. Patient presented for shortness of breath, we will assess with labs and imaging as indicated at this time.   Procedures  ARGENTINA KOSCH was evaluated in Emergency Department on 09/30/2019 for the symptoms described in the history of present illness. She was evaluated in the context of the global COVID-19 pandemic, which necessitated consideration that the patient might be at risk for infection with the SARS-CoV-2 virus that causes COVID-19. Institutional protocols and algorithms that pertain to the evaluation of patients at risk for COVID-19 are in a state of rapid change based on information released by regulatory bodies including the CDC and federal and state organizations. These policies and algorithms were followed during the patient's care in the ED.  ____________________________________________   LABS (pertinent positives/negatives)  Labs Reviewed   BASIC METABOLIC PANEL - Abnormal; Notable for the following components:      Result Value   Sodium 131 (*)    Chloride 93 (*)    Glucose, Bld 54 (*)    Creatinine, Ser 6.57 (*)    Calcium 8.4 (*)    GFR calc non Af Amer 6 (*)    GFR calc Af Amer 7 (*)    All other components within normal limits  CBC - Abnormal; Notable for the following components:   RBC 3.17 (*)    Hemoglobin 9.1 (*)    HCT 28.4 (*)    RDW 15.6 (*)    All other components within normal limits  RESPIRATORY PANEL BY RT PCR (FLU A&B, COVID)  POC URINE PREG, ED  TROPONIN I (HIGH SENSITIVITY)  TROPONIN I (HIGH SENSITIVITY)    RADIOLOGY Images were viewed by me CXR IMPRESSION: Suspect small focus of pneumonia in the lateral right base. No other airspace opacity evident.  Stable cardiomegaly with a degree of pulmonary vascular congestion. Interstitial prominence may represent mild chronic interstitial edema. This appearance is stable from 1 year prior.  Extensive stent placement on the left.  ____________________________________________   DIFFERENTIAL DIAGNOSIS   Volume overload, end-stage renal disease on dialysis, pneumonia, COVID-19  FINAL ASSESSMENT AND PLAN  Shortness of breath, possible pneumonia, ESRD on dialysis   Plan: The patient had presented for shortness of breath with possible pneumonia. Patient's labs were grossly unremarkable. Patient's imaging again revealed a small focus in the lateral right base.  Her oxygen saturation has remained adequate.  I have discussed with nephrology who would prefer to admit her and dialyze her in the hospital.   Laurence Aly, MD    Note: This note was generated in part or whole with voice recognition software. Voice recognition is usually quite accurate but there are transcription errors that can and very often do occur. I apologize for any typographical errors that were not detected and corrected.     Earleen Newport, MD 09/30/19  [phone]

## 2019-09-30 NOTE — ED Triage Notes (Signed)
Pt began yelling out that she can't breathe repeatedly. Pt taken back to triage room by Edison Nasuti, EDT.

## 2019-10-01 DIAGNOSIS — N186 End stage renal disease: Secondary | ICD-10-CM | POA: Diagnosis not present

## 2019-10-01 DIAGNOSIS — I10 Essential (primary) hypertension: Secondary | ICD-10-CM

## 2019-10-01 DIAGNOSIS — E871 Hypo-osmolality and hyponatremia: Secondary | ICD-10-CM | POA: Diagnosis not present

## 2019-10-01 DIAGNOSIS — J189 Pneumonia, unspecified organism: Secondary | ICD-10-CM | POA: Diagnosis not present

## 2019-10-01 LAB — GLUCOSE, CAPILLARY
Glucose-Capillary: 141 mg/dL — ABNORMAL HIGH (ref 70–99)
Glucose-Capillary: 121 mg/dL — ABNORMAL HIGH (ref 70–99)

## 2019-10-01 LAB — BASIC METABOLIC PANEL
Anion gap: 12 (ref 5–15)
BUN: 21 mg/dL — ABNORMAL HIGH (ref 6–20)
CO2: 24 mmol/L (ref 22–32)
Calcium: 7.9 mg/dL — ABNORMAL LOW (ref 8.9–10.3)
Chloride: 95 mmol/L — ABNORMAL LOW (ref 98–111)
Creatinine, Ser: 7.37 mg/dL — ABNORMAL HIGH (ref 0.44–1.00)
GFR calc Af Amer: 6 mL/min — ABNORMAL LOW (ref >60)
GFR calc non Af Amer: 5 mL/min — ABNORMAL LOW (ref >60)
Glucose, Bld: 201 mg/dL — ABNORMAL HIGH (ref 70–99)
Potassium: 4.3 mmol/L (ref 3.5–5.1)
Sodium: 131 mmol/L — ABNORMAL LOW (ref 135–145)

## 2019-10-01 LAB — HIV ANTIBODY (ROUTINE TESTING W REFLEX): HIV Screen 4th Generation wRfx: NONREACTIVE

## 2019-10-01 LAB — INFLUENZA PANEL BY PCR (TYPE A & B)
Influenza A By PCR: NEGATIVE
Influenza B By PCR: NEGATIVE

## 2019-10-01 MED ORDER — LEVOFLOXACIN 250 MG PO TABS: 250.0000 mg | ORAL_TABLET | ORAL | 0 refills | Status: AC

## 2019-10-01 MED ORDER — TRAMADOL HCL 50 MG PO TABS
50.0000 mg | ORAL_TABLET | Freq: Four times a day (QID) | ORAL | Status: DC | PRN
  Administered 2019-10-01: 50 mg via ORAL
  Filled 2019-10-01: qty 1

## 2019-10-01 MED ORDER — EPOETIN ALFA 10000 UNIT/ML IJ SOLN
10000.0000 [IU] | Freq: Once | INTRAMUSCULAR | Status: AC
  Administered 2019-10-01: 10000 [IU] via INTRAVENOUS
  Filled 2019-10-01: qty 1

## 2019-10-01 MED ORDER — HEPARIN SODIUM (PORCINE) 5000 UNIT/ML IJ SOLN
5000.0000 [IU] | Freq: Three times a day (TID) | INTRAMUSCULAR | Status: DC
  Filled 2019-10-01: qty 1

## 2019-10-01 NOTE — Plan of Care (Signed)
  Problem: Clinical Measurements: Goal: Complications related to the disease process, condition or treatment will be avoided or minimized Outcome: Progressing 2621ml of fluids removed during HD Tx

## 2019-10-01 NOTE — Discharge Summary (Signed)
JAQUIA BENEDICTO MEQ:[id number] DOB: October 01, 1960 DOA: 09/30/2019  PCP: Freddy Finner, NP  Admit date: 09/30/2019 Discharge date: 10/01/2019  Admitted From: home Disposition:  home  Recommendations for Outpatient Follow-up:  1. Follow up with PCP in 1 week 2. Please obtain BMP/CBC in one week 3. Please follow up on the following pending results:none  Home Health:none   Discharge Condition:Stable CODE STATUS:full  Diet recommendation: Heart Healthy/renal  Brief/Interim Summary: [name] is a 59 y.o. female with medical history significant  of diabetes mellitus type 2, hypertension, hyperlipidemia, ESRD on hemodialysis resents complaining of shortness of breath.  She missed dialysis on the day of admission .she does state at times she misses dialysis on certain days but makes up for the following day.  Denies any chest pain, reports chronic on and off diarrhea, no fever or chills. ED Course: Oxygen saturation was 100% room air.  Chest x-ray revealed possible small focus of pneumonia in the lateral right base.  Was given levofloxacin by ER 500 mg p.o. with hopes of discharging her home but nephrology wanted to observe patient so patient can have hemodialysis in a.m. Today she had dialysis.  She has chronic hyponatremia which is mild. Also found with sodium of 131 which is chronic.    On admission her blood pressure in ED was 169-176/76 but it was due to patient not taking her blood pressure medication as she takes it only at nighttime.  Today she is feeling better.  Discharge Diagnoses:  Active Problems:   Pneumonia   CAP (community acquired pneumonia)    Discharge Instructions  Discharge Instructions    Call MD for:  difficulty breathing, headache or visual disturbances   Complete by: As directed    Call MD for:  temperature >100.4   Complete by: As directed    Diet - low sodium heart healthy   Complete by: As directed    Increase activity slowly   Complete by: As directed       Allergies as of 10/01/2019      Reactions   Ace Inhibitors Swelling, Anaphylaxis   Ativan [lorazepam] Other (See Comments)   Reaction:Hallucinations and headaches   Compazine [prochlorperazine Edisylate] Anaphylaxis, Nausea And Vomiting, Other (See Comments)   Other reaction(s): dystonia from this vs. Reglan, 23 Jul - patient relates that she takes promethazine frequently with no problems   Sumatriptan Succinate Other (See Comments)   Other reaction(s): delirium and hallucinations per [facility] records   Dilaudid [hydromorphone Hcl] Other (See Comments)   Delirium   Ondansetron Other (See Comments)   hallucinations    Zofran [ondansetron Hcl] Other (See Comments)   Reaction:  hallucinations    Codeine Nausea And Vomiting   Gabapentin Other (See Comments)   Reaction:  Unknown    Lac Bovis Nausea And Vomiting   Losartan Nausea Only   Oxycodone Anxiety   Prochlorperazine Other (See Comments)   Reaction:  Unknown . Patient does not remember reaction but she does have vertigo and anxiety along with n and v at times. Could be used to treat any of these   Reglan [metoclopramide] Other (See Comments)   Per patient her Dr. Evelina Bucy her off it    Scopolamine Other (See Comments)   Dizziness, also has vertigo already   Tape Rash   Tapentadol Rash      Medication List    TAKE these medications   albuterol (2.5 MG/3ML) 0.083% nebulizer solution Commonly known as: PROVENTIL Take 2.5 mg by nebulization every  4 (four) hours as needed for wheezing or shortness of breath.   albuterol 108 (90 Base) MCG/ACT inhaler Commonly known as: VENTOLIN HFA Inhale 2 puffs into the lungs every 6 (six) hours as needed for wheezing or shortness of breath.   amLODipine 10 MG tablet Commonly known as: NORVASC Take 10 mg by mouth daily.   aspirin EC 81 MG tablet Take 81 mg by mouth daily.   ATIVAN PO Take by mouth.   atorvastatin 20 MG tablet Commonly known as: LIPITOR Take 20 mg by mouth every  evening.   Biotin 1 MG Caps Take 1 mg by mouth daily.   carvedilol 3.125 MG tablet Commonly known as: COREG Take 1 tablet (3.125 mg total) by mouth 2 (two) times daily with a meal.   cetirizine 10 MG tablet Commonly known as: ZYRTEC Take 10 mg by mouth daily.   chlorpheniramine-HYDROcodone 10-8 MG/5ML Suer Commonly known as: TUSSIONEX Take 5 mLs by mouth every 12 (twelve) hours.   cholecalciferol 10 MCG (400 UNIT) Tabs tablet Commonly known as: VITAMIN D3 Take 400 Units by mouth daily.   clopidogrel 75 MG tablet Commonly known as: PLAVIX Take 75 mg by mouth daily.   [employer] Iron 325 (65 FE) MG tablet Generic drug: ferrous sulfate Take 325 mg by mouth daily with breakfast.   cyanocobalamin 1000 MCG tablet Take 1,000 mcg by mouth daily.   cyclobenzaprine 10 MG tablet Commonly known as: FLEXERIL Take 1 tablet (10 mg total) by mouth 3 (three) times daily as needed.   diclofenac sodium 1 % Gel Commonly known as: VOLTAREN Apply 1 application topically daily as needed (for rash).   FLUoxetine 20 MG capsule Commonly known as: PROZAC Take 1 capsule (20 mg total) by mouth daily. What changed: when to take this   furosemide 80 MG tablet Commonly known as: LASIX Take 80 mg by mouth daily.   hydrocortisone cream 0.5 % Apply topically 3 (three) times daily.   levofloxacin 250 MG tablet Commonly known as: Levaquin Take 1 tablet (250 mg total) by mouth every other day for 4 doses.   lipase/protease/amylase 12000 units Cpep capsule Commonly known as: CREON Take 36000 mg in the morning, 24000 midday & 36000 in the evening   multivitamin Tabs tablet Take 1 tablet by mouth daily.   nitroGLYCERIN 0.4 MG SL tablet Commonly known as: NITROSTAT Place 0.4 mg under the tongue every 5 (five) minutes as needed for chest pain.   NovoLOG FlexPen 100 UNIT/ML FlexPen Generic drug: insulin aspart Inject 4-15 Units into the skin 4 (four) times daily as needed for high blood sugar (>  100, per sliding scale).   ranitidine 300 MG capsule Commonly known as: ZANTAC Take 1 capsule by mouth every evening.   sevelamer carbonate 800 MG tablet Commonly known as: RENVELA Take 800-2,400 mg by mouth See admin instructions. Take 2400 mg by mouth 3 times daily with meals and take 800 mg by mouth with snacks      Follow-up Information    Freddy Finner, NP Follow up in 1 week(s).   Specialty: Nurse Practitioner Contact information: [address] [place] [address] [phone]          Allergies  Allergen Reactions  . Ace Inhibitors Swelling and Anaphylaxis  . Ativan [Lorazepam] Other (See Comments)    Reaction:Hallucinations and headaches  . Compazine [Prochlorperazine Edisylate] Anaphylaxis, Nausea And Vomiting and Other (See Comments)    Other reaction(s): dystonia from this vs. Reglan, 23 Jul - patient  relates that she takes promethazine frequently with no problems  . Sumatriptan Succinate Other (See Comments)    Other reaction(s): delirium and hallucinations per [facility] records  . Dilaudid [Hydromorphone Hcl] Other (See Comments)    Delirium   . Ondansetron Other (See Comments)    hallucinations    . Zofran [Ondansetron Hcl] Other (See Comments)    Reaction:  hallucinations   . Codeine Nausea And Vomiting  . Gabapentin Other (See Comments)    Reaction:  Unknown    . Lac Bovis Nausea And Vomiting  . Losartan Nausea Only  . Oxycodone Anxiety  . Prochlorperazine Other (See Comments)    Reaction:  Unknown . Patient does not remember reaction but she does have vertigo and anxiety along with n and v at times. Could be used to treat any of these   . Reglan [Metoclopramide] Other (See Comments)    Per patient her Dr. Evelina Bucy her off it   . Scopolamine Other (See Comments)    Dizziness, also has vertigo already  . Tape Rash  . Tapentadol Rash    Consultations:  Nephrology   Procedures/Studies: DG Chest 2 View  Result Date:  09/30/2019 CLINICAL DATA:  Chest pain and shortness of breath.  Renal failure EXAM: CHEST - 2 VIEW COMPARISON:  October 09, 2018 FINDINGS: There is ill-defined airspace opacity in the lateral right base. No other airspace opacity evident. There remains interstitial prominence with questionable interstitial edema. There is cardiomegaly with mild pulmonary venous hypertension. No adenopathy. Extensive stent from the left subclavian to the left brachial region noted. No bone lesions. No pneumothorax. IMPRESSION: Suspect small focus of pneumonia in the lateral right base. No other airspace opacity evident. Stable cardiomegaly with a degree of pulmonary vascular congestion. Interstitial prominence may represent mild chronic interstitial edema. This appearance is stable from 1 year prior. Extensive stent placement on the left. Electronically Signed   By: Lowella Grip III M.D.   On: 09/30/2019 12:19   VAS US DUPLEX DIALYSIS ACCESS (AVF, AVG)  Result Date: 09/29/2019 DIALYSIS ACCESS Reason for Exam: Routine follow up. Access Site: Left Upper Extremity. Access Type: Brachial-axillary AVG. History: 08/24/13: Left brachial-axillary AVG placement;          H/O AVG & outflow vein stents as well as DRIL;. Performing Technologist: Blondell Reveal RT, RDMS, RVT  Examination Guidelines: A complete evaluation includes B-mode imaging, spectral Doppler, color Doppler, and power Doppler as needed of all accessible portions of each vessel. Unilateral testing is considered an integral part of a complete examination. Limited examinations for reoccurring indications may be performed as noted.  Findings:  +--------------------+----------+-----------------+--------+ AVG                 PSV (cm/s)Flow Vol (mL/min)Describe +--------------------+----------+-----------------+--------+ Native artery inflow   232          1251                +--------------------+----------+-----------------+--------+ Arterial anastomosis   459                               +--------------------+----------+-----------------+--------+ Prox graft             307                      stent   +--------------------+----------+-----------------+--------+ Mid graft              281  stent   +--------------------+----------+-----------------+--------+ Distal graft           225                              +--------------------+----------+-----------------+--------+ Venous anastomosis     370                      stent   +--------------------+----------+-----------------+--------+ Venous outflow         142                      stent   +--------------------+----------+-----------------+--------+ Antegrade, biphasic flow in the left distal radial artery with velocities of 98cm/s at rest and 105cm/s during access compression. No significant velocity increase consistent with significant access steal. Patent DRIL graft noted.  Summary: Patent left brachial-axillary AVG with a normal Flow Volume and no significant stenosis.  *See table(s) above for measurements and observations.  Diagnosing physician: Hortencia Pilar MD Electronically signed by Hortencia Pilar MD on 09/29/2019 at 5:21:36 PM.   --------------------------------------------------------------------------------   Final    VAS US CAROTID  Result Date: 09/29/2019 Carotid Arterial Duplex Study Indications:   Carotid artery disease. Other Factors: Current left arm AVG. Performing Technologist: Blondell Reveal RT, RDMS, RVT  Examination Guidelines: A complete evaluation includes B-mode imaging, spectral Doppler, color Doppler, and power Doppler as needed of all accessible portions of each vessel. Bilateral testing is considered an integral part of a complete examination. Limited examinations for reoccurring indications may be performed as noted.  Right Carotid Findings: +----------+--------+--------+--------+------------------+-----------------+           PSV  cm/sEDV cm/sStenosisPlaque DescriptionComments          +----------+--------+--------+--------+------------------+-----------------+ CCA Prox  81      0                                                   +----------+--------+--------+--------+------------------+-----------------+ CCA Mid   73      11                                                  +----------+--------+--------+--------+------------------+-----------------+ CCA Distal51      10              heterogenous                        +----------+--------+--------+--------+------------------+-----------------+ ICA Prox  434     87      80-99%  heterogenous      ICA/CCA ratio=5.9 +----------+--------+--------+--------+------------------+-----------------+ ICA Mid   174     29                                                  +----------+--------+--------+--------+------------------+-----------------+ ICA Distal98      16                                                  +----------+--------+--------+--------+------------------+-----------------+  ECA       127     0               heterogenous                        +----------+--------+--------+--------+------------------+-----------------+ +----------+--------+-------+----------------+-------------------+           PSV cm/sEDV cmsDescribe        Arm Pressure (mmHG) +----------+--------+-------+----------------+-------------------+ [id number]            Multiphasic, WNL                    +----------+--------+-------+----------------+-------------------+ +---------+--------+--+--------+---------+ VertebralPSV cm/s87EDV cm/sAntegrade +---------+--------+--+--------+---------+  Left Carotid Findings: +----------+--------+--------+--------+------------------+-----------------+           PSV cm/sEDV cm/sStenosisPlaque DescriptionComments          +----------+--------+--------+--------+------------------+-----------------+ CCA Prox  87       10                                                  +----------+--------+--------+--------+------------------+-----------------+ CCA Mid   69      11                                                  +----------+--------+--------+--------+------------------+-----------------+ CCA Distal55      11                                                  +----------+--------+--------+--------+------------------+-----------------+ ICA Prox  213     43      40-59%  heterogenous      ICA/CCA ratio=3.1 +----------+--------+--------+--------+------------------+-----------------+ ICA Mid   124     24                                                  +----------+--------+--------+--------+------------------+-----------------+ ICA Distal71      18                                                  +----------+--------+--------+--------+------------------+-----------------+ ECA       103     0                                                   +----------+--------+--------+--------+------------------+-----------------+ +----------+--------+--------+-------------------------+-------------------+           PSV cm/sEDV cm/sDescribe                 Arm Pressure (mmHG) +----------+--------+--------+-------------------------+-------------------+ [id number]             Monophasic, fistular flow                    +----------+--------+--------+-------------------------+-------------------+ +---------+--------+--+--------+---------+  VertebralPSV cm/s57EDV cm/sAntegrade +---------+--------+--+--------+---------+  Summary: Right Carotid: Velocities in the right ICA are consistent with a 80-99%                stenosis. Left Carotid: Velocities in the left ICA are consistent with a 40-59% stenosis.                No significant change noted when compared to the previous exam on               01/07/18. *See table(s) above for measurements and observations. Electronically signed by  Hortencia Pilar MD on 09/29/2019 at 5:21:32 PM.    Final       Subjective: Denies shortness of breath, chest pain, fever or chills Discharge Exam: Vitals:   10/01/19 1445 10/01/19 1500  BP: (!) 165/58 (!) 149/59  Pulse: 66 68  Resp: (!) 29 17  Temp:    SpO2: 100% 100%   Vitals:   10/01/19 1415 10/01/19 1430 10/01/19 1445 10/01/19 1500  BP: (!) 171/60 (!) 158/53 (!) 165/58 (!) 149/59  Pulse: 70 67 66 68  Resp: 16 18 (!) 29 17  Temp:      TempSrc:      SpO2: 100% 100% 100% 100%  Weight:      Height:        General: Pt is alert, awake, not in acute distress, looks better, more interactive today Cardiovascular: RRR, S1/S2 +, no rubs, no gallops Respiratory: CTA bilaterally, no wheezing, no rhonchi Abdominal: Soft, NT, ND, bowel sounds + Extremities: no edema, no cyanosis Neuro: aaxox3, no focal deficits noted    The results of significant diagnostics from this hospitalization (including imaging, microbiology, ancillary and laboratory) are listed below for reference.     Microbiology: Recent Results (from the past 240 hour(s))  Respiratory Panel by RT PCR (Flu A&B, Covid) - Nasopharyngeal Swab     Status: None   Collection Time: 09/30/19  5:30 PM   Specimen: Nasopharyngeal Swab  Result Value Ref Range Status   SARS Coronavirus 2 by RT PCR NEGATIVE NEGATIVE Final    Comment: (NOTE) SARS-CoV-2 target nucleic acids are NOT DETECTED. The SARS-CoV-2 RNA is generally detectable in upper respiratoy specimens during the acute phase of infection. The lowest concentration of SARS-CoV-2 viral copies this assay can detect is 131 copies/mL. A negative result does not preclude SARS-Cov-2 infection and should not be used as the sole basis for treatment or other patient management decisions. A negative result may occur with  improper specimen collection/handling, submission of specimen other than nasopharyngeal swab, presence of viral mutation(s) within the areas targeted by this  assay, and inadequate number of viral copies (<131 copies/mL). A negative result must be combined with clinical observations, patient history, and epidemiological information. The expected result is Negative. Fact Sheet for Patients:  PinkCheek.be Fact Sheet for Healthcare Providers:  GravelBags.it This test is not yet ap proved or cleared by the [place] FDA and  has been authorized for detection and/or diagnosis of SARS-CoV-2 by FDA under an Emergency Use Authorization (EUA). This EUA will remain  in effect (meaning this test can be used) for the duration of the COVID-19 declaration under Section 564(b)(1) of the Act, 21 U.S.C. section 360bbb-3(b)(1), unless the authorization is terminated or revoked sooner.    Influenza A by PCR NEGATIVE NEGATIVE Final   Influenza B by PCR NEGATIVE NEGATIVE Final    Comment: (NOTE) The Xpert Xpress SARS-CoV-2/FLU/RSV assay is intended as an aid in  the  diagnosis of influenza from Nasopharyngeal swab specimens and  should not be used as a sole basis for treatment. Nasal washings and  aspirates are unacceptable for Xpert Xpress SARS-CoV-2/FLU/RSV  testing. Fact Sheet for Patients: PinkCheek.be Fact Sheet for Healthcare Providers: GravelBags.it This test is not yet approved or cleared by the [place] FDA and  has been authorized for detection and/or diagnosis of SARS-CoV-2 by  FDA under an Emergency Use Authorization (EUA). This EUA will remain  in effect (meaning this test can be used) for the duration of the  Covid-19 declaration under Section 564(b)(1) of the Act, 21  U.S.C. section 360bbb-3(b)(1), unless the authorization is  terminated or revoked. Performed at [facility], [address] [place], [place] [address]      Labs: BNP (last 3 results) Recent Labs    10/04/18 1140  BNP 348.0*   Basic  Metabolic Panel: Recent Labs  Lab 09/30/19 1100 10/01/19 0531  NA 131* 131*  K 4.1 4.3  CL 93* 95*  CO2 25 24  GLUCOSE 54* 201*  BUN 19 21*  CREATININE 6.57* 7.37*  CALCIUM 8.4* 7.9*   Liver Function Tests: No results for input(s): AST, ALT, ALKPHOS, BILITOT, PROT, ALBUMIN in the last 168 hours. No results for input(s): LIPASE, AMYLASE in the last 168 hours. No results for input(s): AMMONIA in the last 168 hours. CBC: Recent Labs  Lab 09/30/19 1100  WBC 5.1  HGB 9.1*  HCT 28.4*  MCV 89.6  PLT 174   Cardiac Enzymes: No results for input(s): CKTOTAL, CKMB, CKMBINDEX, TROPONINI in the last 168 hours. BNP: Invalid input(s): POCBNP CBG: Recent Labs  Lab 09/30/19 1813 09/30/19 2113 10/01/19 0810  GLUCAP 61* 72 141*   D-Dimer No results for input(s): DDIMER in the last 72 hours. Hgb A1c Recent Labs    09/30/19 1100  HGBA1C 5.7*   Lipid Profile No results for input(s): CHOL, HDL, LDLCALC, TRIG, CHOLHDL, LDLDIRECT in the last 72 hours. Thyroid function studies No results for input(s): TSH, T4TOTAL, T3FREE, THYROIDAB in the last 72 hours.  Invalid input(s): FREET3 Anemia work up No results for input(s): VITAMINB12, FOLATE, FERRITIN, TIBC, IRON, RETICCTPCT in the last 72 hours. Urinalysis    Component Value Date/Time   COLORURINE YELLOW (A) 06/09/2017 1039   APPEARANCEUR CLEAR (A) 06/09/2017 1039   APPEARANCEUR Clear 09/15/2014 0947   LABSPEC 1.014 06/09/2017 1039   LABSPEC 1.008 09/15/2014 0947   PHURINE 8.0 06/09/2017 1039   GLUCOSEU 150 (A) 06/09/2017 1039   GLUCOSEU 150 mg/dL 09/15/2014 0947   HGBUR SMALL (A) 06/09/2017 1039   BILIRUBINUR NEGATIVE 06/09/2017 1039   BILIRUBINUR Negative 09/15/2014 0947   KETONESUR NEGATIVE 06/09/2017 1039   PROTEINUR >=300 (A) 06/09/2017 1039   UROBILINOGEN 0.2 06/30/2010 2130   NITRITE NEGATIVE 06/09/2017 1039   LEUKOCYTESUR NEGATIVE 06/09/2017 1039   LEUKOCYTESUR Negative 09/15/2014 0947   Sepsis Labs Invalid  input(s): PROCALCITONIN,  WBC,  LACTICIDVEN Microbiology Recent Results (from the past 240 hour(s))  Respiratory Panel by RT PCR (Flu A&B, Covid) - Nasopharyngeal Swab     Status: None   Collection Time: 09/30/19  5:30 PM   Specimen: Nasopharyngeal Swab  Result Value Ref Range Status   SARS Coronavirus 2 by RT PCR NEGATIVE NEGATIVE Final    Comment: (NOTE) SARS-CoV-2 target nucleic acids are NOT DETECTED. The SARS-CoV-2 RNA is generally detectable in upper respiratoy specimens during the acute phase of infection. The lowest concentration of SARS-CoV-2 viral copies this assay can detect is 131  copies/mL. A negative result does not preclude SARS-Cov-2 infection and should not be used as the sole basis for treatment or other patient management decisions. A negative result may occur with  improper specimen collection/handling, submission of specimen other than nasopharyngeal swab, presence of viral mutation(s) within the areas targeted by this assay, and inadequate number of viral copies (<131 copies/mL). A negative result must be combined with clinical observations, patient history, and epidemiological information. The expected result is Negative. Fact Sheet for Patients:  PinkCheek.be Fact Sheet for Healthcare Providers:  GravelBags.it This test is not yet ap proved or cleared by the [place] FDA and  has been authorized for detection and/or diagnosis of SARS-CoV-2 by FDA under an Emergency Use Authorization (EUA). This EUA will remain  in effect (meaning this test can be used) for the duration of the COVID-19 declaration under Section 564(b)(1) of the Act, 21 U.S.C. section 360bbb-3(b)(1), unless the authorization is terminated or revoked sooner.    Influenza A by PCR NEGATIVE NEGATIVE Final   Influenza B by PCR NEGATIVE NEGATIVE Final    Comment: (NOTE) The Xpert Xpress SARS-CoV-2/FLU/RSV assay is intended as an aid in   the diagnosis of influenza from Nasopharyngeal swab specimens and  should not be used as a sole basis for treatment. Nasal washings and  aspirates are unacceptable for Xpert Xpress SARS-CoV-2/FLU/RSV  testing. Fact Sheet for Patients: PinkCheek.be Fact Sheet for Healthcare Providers: GravelBags.it This test is not yet approved or cleared by the [place] FDA and  has been authorized for detection and/or diagnosis of SARS-CoV-2 by  FDA under an Emergency Use Authorization (EUA). This EUA will remain  in effect (meaning this test can be used) for the duration of the  Covid-19 declaration under Section 564(b)(1) of the Act, 21  U.S.C. section 360bbb-3(b)(1), unless the authorization is  terminated or revoked. Performed at [facility], [address], [place], [place] [address]      Time coordinating discharge: Over 30 minutes  SIGNED:   Nolberto Hanlon, MD  [facility] 10/01/2019, 3:40 PM Pager   If 7PM-7AM, please contact night-coverage [email or web address] Password [email or web address]

## 2019-10-01 NOTE — Plan of Care (Signed)

## 2019-10-01 NOTE — Progress Notes (Signed)
HD Tx Completed    10/01/19 1515  Vital Signs  Pulse Rate 72  Resp 17  BP (!) 154/59  Oxygen Therapy  SpO2 95 %  O2 Device Room Air  Pain Assessment  Pain Scale 0-10  Pain Score 0  During Hemodialysis Assessment  Blood Flow Rate (mL/min) 350 mL/min  Arterial Pressure (mmHg) -150 mmHg  Venous Pressure (mmHg) 220 mmHg  Transmembrane Pressure (mmHg) 40 mmHg  Ultrafiltration Rate (mL/min) 100 mL/min  Dialysate Flow Rate (mL/min) 600 ml/min  Conductivity: Machine  14  HD Safety Checks Performed Yes  Intra-Hemodialysis Comments Tolerated well;Tx completed

## 2019-10-01 NOTE — Progress Notes (Signed)
Pre HD tx note   Pt's AVG is difficult to access. BFR could not exceed 300. Nephrologist is aware and notified. Pt Tx is planned to run for 3hrs. Pt reports no chest pain or SOB, BP WDL to start Tx. Will keep monitoring  10/01/19 1230  Hand-Off documentation  Report given to (Full Name) Newt Minion RN   Report received from (Full Name) Mallie Snooks RN  Vital Signs  Temp 98.6 F (37 C)  Temp Source Oral  Pulse Rate 63  Resp 14  BP (!) 166/60  BP Location Right Arm  BP Method Automatic  Patient Position (if appropriate) Lying  Oxygen Therapy  SpO2 99 %  O2 Device Nasal Cannula  O2 Flow Rate (L/min) 3 L/min  Pain Assessment  Pain Scale 0-10  Pain Score 0  Time-Out for Hemodialysis  What Procedure? HD  Pt Identifiers(min of two) First/Last Name;MRN/Account#  Correct Site? Yes  Correct Side? Yes  Correct Procedure? Yes  Consents Verified? Yes  Safety Precautions Reviewed? Yes  Engineer, civil (consulting) Number 7  Station Number 1  UF/Alarm Test Passed  Conductivity: Meter 14  Conductivity: Machine  13.8  pH 7.2  Reverse Osmosis Main  Normal Saline Lot Number [id number]  Dialyzer Lot Number [id number]  Disposable Set Lot Number [id number]  Machine Temperature 98.6 F (37 C)  Musician and Audible Yes  Blood Lines Intact and Secured Yes  Pre Treatment Patient Checks  Vascular access used during treatment Graft  HD catheter dressing before treatment WDL  Patient is receiving dialysis in a chair  (In Bed)  Hepatitis B Surface Antigen Results Negative  Date Hepatitis B Surface Antigen Drawn 09/21/19  Hepatitis B Surface Antibody  (<10)  Date Hepatitis B Surface Antibody Drawn 09/21/19  Hemodialysis Consent Verified Yes  Hemodialysis Standing Orders Initiated Yes  ECG (Telemetry) Monitor On Yes  Prime Ordered Normal Saline  Length of  DialysisTreatment -hour(s) 3 Hour(s)  Dialysis Treatment Comments  (Na140)  Dialyzer Elisio 17H NR  Dialysate 2K;2.5 Ca  Dialysis  Anticoagulant None  Dialysate Flow Ordered 600  Blood Flow Rate Ordered 400 mL/min  Ultrafiltration Goal 3 Liters  Dialysis Blood Pressure Support Ordered Albumin  Education / Care Plan  Dialysis Education Provided Yes  Documented Education in Care Plan Yes  Fistula / Graft Left Upper arm Arteriovenous vein graft  Placement Date: (c)   Placed prior to admission: Yes  Orientation: Left  Access Location: Upper arm  Access Type: Arteriovenous vein graft  Site Condition No complications  Fistula / Graft Assessment Thrill;Present;Bruit  Status Accessed  Needle Size 15  Drainage Description None

## 2019-10-01 NOTE — Plan of Care (Signed)

## 2019-10-01 NOTE — Progress Notes (Signed)
Post HD Tx Note  Pt tolerated well the Tx. Pt kept moving her access arm until venous and arterial pressure got higher than usual and treatment had to be ended 17mn earlier. Prescribed UF of 3500 ml was not met actual UF [id number] reported no chest pain or SOB. Primary physician Dr. saNolberto Hanlonalled to ask pt to be waened off O2. Pt on RA was 95-97%.    10/01/19 1530  Hand-Off documentation  Report given to (Full Name) AmHelen HashimotoN   Report received from (Full Name) DiNewt MinionN  Vital Signs  Temp 98.4 F (36.9 C)  Temp Source Oral  Pulse Rate 76  Resp 15  BP (!) 138/59  BP Location Right Arm  BP Method Automatic  Patient Position (if appropriate) Lying  Oxygen Therapy  SpO2 97 %  O2 Device Room Air  Pain Assessment  Pain Scale 0-10  Pain Score 0  Post-Hemodialysis Assessment  Rinseback Volume (mL) 250 mL  KECN 44.1 V  Dialyzer Clearance Lightly streaked  Duration of HD Treatment -hour(s) 3 hour(s)  Hemodialysis Intake (mL) 500 mL  UF Total -Machine (mL) 2650 mL  Net UF (mL) 2150 mL  Tolerated HD Treatment Yes  Fistula / Graft Left Upper arm Arteriovenous vein graft  Placement Date: (c)   Placed prior to admission: Yes  Orientation: Left  Access Location: Upper arm  Access Type: Arteriovenous vein graft  Site Condition No complications  Fistula / Graft Assessment Thrill;Present;Bruit  Status Deaccessed  Needle Size 15  Drainage Description None

## 2019-10-01 NOTE — Progress Notes (Signed)
Brief Pharmacy Note  Patient has been switched from Lovenox to [facility] for VTE prophylaxis for CrCl < 15 ml/min per [facility] protocol.  Dorena Bodo, PharmD

## 2019-10-01 NOTE — Progress Notes (Signed)
HD Tx Started    10/01/19 1231  Vital Signs  Pulse Rate 65  Resp 16  BP (!) 166/60  Oxygen Therapy  SpO2 99 %  O2 Device Nasal Cannula  O2 Flow Rate (L/min) 3 L/min  Pain Assessment  Pain Scale 0-10  Pain Score 0  During Hemodialysis Assessment  Blood Flow Rate (mL/min) 300 mL/min  Arterial Pressure (mmHg) -150 mmHg  Venous Pressure (mmHg) 200 mmHg  Transmembrane Pressure (mmHg) 40 mmHg  Ultrafiltration Rate (mL/min) 1000 mL/min  Dialysate Flow Rate (mL/min) 600 ml/min  Conductivity: Machine  13.8  HD Safety Checks Performed Yes  Dialysis Fluid Bolus Normal Saline  Bolus Amount (mL) 250 mL  Intra-Hemodialysis Comments Tx initiated

## 2019-10-01 NOTE — Progress Notes (Signed)
Pre HD Tx Assessment   10/01/19 1230  Neurological  Level of Consciousness Alert  Orientation Level Oriented X4  Respiratory  Respiratory Pattern Regular;Unlabored  Chest Assessment Chest expansion symmetrical  Bilateral Breath Sounds Clear;Diminished  Cardiac  Pulse Regular  Heart Sounds S1, S2  Jugular Venous Distention (JVD) No  ECG Monitor Yes  Cardiac Rhythm NSR  Antiarrhythmic device No  Vascular  R Radial Pulse +2  L Radial Pulse +2  R Dorsalis Pedis Pulse +2  L Dorsalis Pedis Pulse +2  Integumentary  Integumentary (WDL) X  Skin Color Appropriate for ethnicity  Skin Condition Dry;Flaky  Skin Integrity Intact  Musculoskeletal  Musculoskeletal (WDL) X  Generalized Weakness Yes  Gastrointestinal  Bowel Sounds Assessment Active  Last BM Date 09/30/19  GU Assessment  Genitourinary (WDL) X  Genitourinary Symptoms Anuria (ESRD Pt)  Psychosocial  Psychosocial (WDL) X  Patient Behaviors Uncooperative  Needs Expressed Emotional  Emotional support given Given to patient

## 2019-10-01 NOTE — Care Management Obs Status (Signed)
[place] NOTIFICATION   Patient Details  Name: [name] MRN: [id number] Date of Birth: 21-Apr-1961   Medicare Observation Status Notification Given:  Yes    Aliza Moret I Melah Ebling, LCSW 10/01/2019, 4:26 PM

## 2019-10-01 NOTE — Care Management CC44 (Signed)
Condition Code 44 Documentation Completed  Patient Details  Name: [name] MRN: [id number] Date of Birth: 04-25-1961   Condition Code 44 given:  Yes Patient signature on Condition Code 44 notice:  Yes Documentation of 2 MD's agreement:  Yes Code 44 added to claim:  Yes    Parthena Fergeson I Jonhatan Hearty, LCSW 10/01/2019, 4:27 PM

## 2019-10-01 NOTE — Progress Notes (Signed)
Patient discharging home, instructions given, verbalized understanding.

## 2019-10-01 NOTE — ED Notes (Signed)
ED TO INPATIENT HANDOFF REPORT  ED Nurse Name and Phone #: Tracie Harrier Name/Age/Gender [name] 59 y.o. female Room/Bed: ED17A/ED17A  Code Status   Code Status: Full Code  Home/SNF/Other Home Patient oriented to: self, place, time and situation Is this baseline? Yes   Triage Complete: Triage complete  Chief Complaint Pneumonia [J18.9] CAP (community acquired pneumonia) [J18.9]  Triage Note First RN Note: Pt presents to ED via [facility] with c/o SOB x several months, suddenly worse yesterday. EMS states CP with deep breath that is reproducible with palpation.   55-57HR 100% RA 161/64   Pt began yelling out that she can't breathe repeatedly. Pt taken back to triage room by Edison Nasuti, EDT.   Pt states was supposed to have dialysis today, but did not go, was referred by primary doctor due to SOB. EMS reports pt has had increasing SOB x months, suddenly worse yesterday. VSS in triage. Pt 100% on RA.   Pt also c/o chest tightness at this time. Pt states she feels like something is stuck in her throat and won't go down.     Allergies Allergies  Allergen Reactions  . Ace Inhibitors Swelling and Anaphylaxis  . Ativan [Lorazepam] Other (See Comments)    Reaction:Hallucinations and headaches  . Compazine [Prochlorperazine Edisylate] Anaphylaxis, Nausea And Vomiting and Other (See Comments)    Other reaction(s): dystonia from this vs. Reglan, 23 Jul - patient relates that she takes promethazine frequently with no problems  . Sumatriptan Succinate Other (See Comments)    Other reaction(s): delirium and hallucinations per [facility] records  . Dilaudid [Hydromorphone Hcl] Other (See Comments)    Delirium   . Ondansetron Other (See Comments)    hallucinations    . Zofran [Ondansetron Hcl] Other (See Comments)    Reaction:  hallucinations   . Codeine Nausea And Vomiting  . Gabapentin Other (See Comments)    Reaction:  Unknown    . Lac Bovis Nausea And Vomiting  . Losartan  Nausea Only  . Oxycodone Anxiety  . Prochlorperazine Other (See Comments)    Reaction:  Unknown . Patient does not remember reaction but she does have vertigo and anxiety along with n and v at times. Could be used to treat any of these   . Reglan [Metoclopramide] Other (See Comments)    Per patient her Dr. Evelina Bucy her off it   . Scopolamine Other (See Comments)    Dizziness, also has vertigo already  . Tape Rash  . Tapentadol Rash    Level of Care/Admitting Diagnosis ED Disposition    ED Disposition Condition [facility] Area: [place] [[id number]]  Level of Care: Med-Surg [16]  Covid Evaluation: Confirmed COVID Negative  Date Laboratory Confirmed COVID Negative: 10/01/2019  Diagnosis: CAP (community acquired pneumonia) [182993]  Admitting Physician: Ellin Saba  Attending Physician: Nolberto Hanlon [[id number]]  Estimated length of stay: 3 - 4 days  Certification:: I certify this patient will need inpatient services for at least 2 midnights       B Medical/Surgery History Past Medical History:  Diagnosis Date  . Anemia   . Anginal pain ([place])   . Anxiety   . Arthritis   . Asthma   . Broken wrist   . Bronchitis   . chronic diastolic CHF 9/38/1017  . COPD (chronic obstructive pulmonary disease) ([place])   . Coronary artery disease    a. cath 2013: stenting to RCA (report not available); b.  cath 2014: LM nl, pLAD 40%, mLAD nl, ost LCx 40%, mid LCx nl, pRCA 30% @ site of prior stent, mRCA 50%  . Depression   . Diabetes mellitus without complication ([place])   . Diabetic neuropathy ([place])   . dialysis 2006  . Diverticulosis   . Dizziness   . Dyspnea   . Elevated lipids   . Environmental and seasonal allergies   . ESRD (end stage renal disease) on dialysis ([place])    M-W-F  . GERD (gastroesophageal reflux disease)   . Headache   . History of hiatal hernia   . HOH (hard of hearing)   . Hx of pancreatitis 2015  . Hypertension   . Lower  extremity edema   . Mitral regurgitation    a. echo 10/2013: EF 62%, noWMA, mildly dilated LA, mild to mod MR/TR, GR1DD  . Myocardial infarction ([place])   . Orthopnea   . Pneumonia   . Renal cancer ([place])   . Renal insufficiency    Pt is on dialysis on M,W + F.  . Wheezing    Past Surgical History:  Procedure Laterality Date  . A/V SHUNTOGRAM Left 01/20/2018   Procedure: A/V SHUNTOGRAM;  Surgeon: Algernon Huxley, MD;  Location: [place] [facility];  Service: Cardiovascular;  Laterality: Left;  . ABDOMINAL HYSTERECTOMY  1992  . AMPUTATION TOE Left 10/02/2017   Procedure: AMPUTATION TOE-LEFT GREAT TOE;  Surgeon: Albertine Patricia, DPM;  Location: [facility] ORS;  Service: Podiatry;  Laterality: Left;  . APPENDECTOMY    . ARTERY BIOPSY Right 10/11/2018   Procedure: BIOPSY TEMPORAL ARTERY;  Surgeon: Vickie Epley, MD;  Location: [facility] OR[facility];  Service: General;  Laterality: Right;  . CARDIAC CATHETERIZATION Left 07/26/2015   Procedure: Left Heart Cath and Coronary Angiography;  Surgeon: Dionisio David, MD;  Location: [place] [facility];  Service: Cardiovascular;  Laterality: Left;  . CATARACT EXTRACTION W/ INTRAOCULAR LENS IMPLANT Right   . CATARACT EXTRACTION W/PHACO Left 03/10/2017   Procedure: CATARACT EXTRACTION PHACO AND INTRAOCULAR LENS PLACEMENT (IOC);  Surgeon: Birder Robson, MD;  Location: [facility];  Service: Ophthalmology;  Laterality: Left;  [place] 00:51.9 AP% 14.2 CDE 7.39 fluid pack lot # [id number]  . CHOLECYSTECTOMY    . COLONOSCOPY WITH PROPOFOL N/A 08/12/2016   Procedure: COLONOSCOPY WITH PROPOFOL;  Surgeon: Lollie Sails, MD;  Location: [facility] ENDOSCOPY;  Service: Endoscopy;  Laterality: N/A;  . DIALYSIS FISTULA CREATION Left    upper arm  . dialysis grafts    . ESOPHAGOGASTRODUODENOSCOPY N/A 03/08/2015   Procedure: ESOPHAGOGASTRODUODENOSCOPY (EGD);  Surgeon: Manya Silvas, MD;  Location: [facility] ENDOSCOPY;  Service: Endoscopy;  Laterality: N/A;  . ESOPHAGOGASTRODUODENOSCOPY (EGD)  WITH PROPOFOL N/A 03/18/2016   Procedure: ESOPHAGOGASTRODUODENOSCOPY (EGD) WITH PROPOFOL;  Surgeon: [name], MD;  Location: [facility] ENDOSCOPY;  Service: Endoscopy;  Laterality: N/A;  . EYE SURGERY Right 2018  . FECAL TRANSPLANT N/A 08/23/2015   Procedure: FECAL TRANSPLANT;  Surgeon: Manya Silvas, MD;  Location[facility] ENDOSCOPY;  Service: Endoscopy;  Laterality: N/A;  . HAND SURGERY Bilateral   . IR RADIOLOGIST EVAL & MGMT  07/28/2019  . IR RADIOLOGIST EVAL & MGMT  08/11/2019  . PERIPHERAL VASCULAR CATHETERIZATION N/A 12/20/2015   Procedure: Thrombectomy of dialysis access versus permcath placement;  Surgeon: Algernon Huxley, MD;  Location: [place] [facility];  Service: Cardiovascular;  Laterality: N/A;  . PERIPHERAL VASCULAR CATHETERIZATION N/A 12/20/2015   Procedure: A/V Shunt Intervention;  Surgeon: Algernon Huxley, MD;  Location: [facility]  INVASIVE CV LAB;  Service: Cardiovascular;  Laterality: N/A;  . PERIPHERAL VASCULAR CATHETERIZATION N/A 12/20/2015   Procedure: A/V Shuntogram/Fistulagram;  Surgeon: Algernon Huxley, MD;  Location: [place] [facility];  Service: Cardiovascular;  Laterality: N/A;  . PERIPHERAL VASCULAR CATHETERIZATION N/A 01/02/2016   Procedure: A/V Shuntogram/Fistulagram;  Surgeon: Algernon Huxley, MD;  Location: [place] [facility];  Service: Cardiovascular;  Laterality: N/A;  . PERIPHERAL VASCULAR CATHETERIZATION N/A 01/02/2016   Procedure: A/V Shunt Intervention;  Surgeon: Algernon Huxley, MD;  Location: [place] [facility];  Service: Cardiovascular;  Laterality: N/A;     A IV Location/Drains/Wounds Patient Lines/Drains/Airways Status   Active Line/Drains/Airways    Name:   Placement date:   Placement time:   Site:   Days:   Peripheral IV 09/30/19 Right Forearm   09/30/19    2115    Forearm   1   Peripheral IV 09/30/19 Anterior;Right Forearm   09/30/19    2128    Forearm   1   Fistula / Graft Left Upper arm Arteriovenous vein graft   --    --    Upper arm      Fistula / Graft Right Upper  arm Arteriovenous fistula   --    --    Upper arm      Airway   10/11/18    1023     355   Incision (Closed) 10/11/18 Face Right   10/11/18    1055     355          Intake/Output Last 24 hours No intake or output data in the 24 hours ending 10/01/19 6160  Labs/Imaging Results for orders placed or performed during the hospital encounter of 09/30/19 (from the past 48 hour(s))  Basic metabolic panel     Status: Abnormal   Collection Time: 09/30/19 11:00 AM  Result Value Ref Range   Sodium 131 (L) 135 - 145 mmol/L   Potassium 4.1 3.5 - 5.1 mmol/L   Chloride 93 (L) 98 - 111 mmol/L   CO2 25 22 - 32 mmol/L   Glucose, Bld 54 (L) 70 - 99 mg/dL   BUN 19 6 - 20 mg/dL   Creatinine, Ser 6.57 (H) 0.44 - 1.00 mg/dL   Calcium 8.4 (L) 8.9 - 10.3 mg/dL   GFR calc non Af Amer 6 (L) >60 mL/min   GFR calc Af Amer 7 (L) >60 mL/min   Anion gap 13 5 - 15    Comment: Performed at [facility], [address] [place], [place] [address]  CBC     Status: Abnormal   Collection Time: 09/30/19 11:00 AM  Result Value Ref Range   WBC 5.1 4.0 - 10.5 K/uL   RBC 3.17 (L) 3.87 - 5.11 MIL/uL   Hemoglobin 9.1 (L) 12.0 - 15.0 g/dL   HCT 28.4 (L) 36.0 - 46.0 %   MCV 89.6 80.0 - 100.0 fL   MCH 28.7 26.0 - 34.0 pg   MCHC 32.0 30.0 - 36.0 g/dL   RDW 15.6 (H) 11.5 - 15.5 %   Platelets 174 150 - 400 K/uL   nRBC 0.0 0.0 - 0.2 %    Comment: Performed at [facility], [place], [place] [address]  Troponin I (High Sensitivity)     Status: None   Collection Time: 09/30/19 11:00 AM  Result Value Ref Range   Troponin I (High Sensitivity) 4 <18 ng/L    Comment: (NOTE) Elevated  high sensitivity troponin I (hsTnI) values and significant  changes across serial measurements may suggest ACS but many other  chronic and acute conditions are known to elevate hsTnI results.  Refer to the "Links" section for chest pain algorithms and additional  guidance. Performed at [facility],  [address] [place], [place] [address]   Hemoglobin A1c     Status: Abnormal   Collection Time: 09/30/19 11:00 AM  Result Value Ref Range   Hgb A1c MFr Bld 5.7 (H) 4.8 - 5.6 %    Comment: (NOTE) Pre diabetes:          5.7%-6.4% Diabetes:              >6.4% Glycemic control for   <7.0% adults with diabetes    Mean Plasma Glucose 116.89 mg/dL    Comment: Performed at [facility] [address], [place], [place] [address]  Respiratory Panel by RT PCR (Flu A&B, Covid) - Nasopharyngeal Swab     Status: None   Collection Time: 09/30/19  5:30 PM   Specimen: Nasopharyngeal Swab  Result Value Ref Range   SARS Coronavirus 2 by RT PCR NEGATIVE NEGATIVE    Comment: (NOTE) SARS-CoV-2 target nucleic acids are NOT DETECTED. The SARS-CoV-2 RNA is generally detectable in upper respiratoy specimens during the acute phase of infection. The lowest concentration of SARS-CoV-2 viral copies this assay can detect is 131 copies/mL. A negative result does not preclude SARS-Cov-2 infection and should not be used as the sole basis for treatment or other patient management decisions. A negative result may occur with  improper specimen collection/handling, submission of specimen other than nasopharyngeal swab, presence of viral mutation(s) within the areas targeted by this assay, and inadequate number of viral copies (<131 copies/mL). A negative result must be combined with clinical observations, patient history, and epidemiological information. The expected result is Negative. Fact Sheet for Patients:  PinkCheek.be Fact Sheet for Healthcare Providers:  GravelBags.it This test is not yet ap proved or cleared by the [place] FDA and  has been authorized for detection and/or diagnosis of SARS-CoV-2 by FDA under an Emergency Use Authorization (EUA). This EUA will remain  in effect (meaning this test can be used) for the duration of  the COVID-19 declaration under Section 564(b)(1) of the Act, 21 U.S.C. section 360bbb-3(b)(1), unless the authorization is terminated or revoked sooner.    Influenza A by PCR NEGATIVE NEGATIVE   Influenza B by PCR NEGATIVE NEGATIVE    Comment: (NOTE) The Xpert Xpress SARS-CoV-2/FLU/RSV assay is intended as an aid in  the diagnosis of influenza from Nasopharyngeal swab specimens and  should not be used as a sole basis for treatment. Nasal washings and  aspirates are unacceptable for Xpert Xpress SARS-CoV-2/FLU/RSV  testing. Fact Sheet for Patients: PinkCheek.be Fact Sheet for Healthcare Providers: GravelBags.it This test is not yet approved or cleared by the [place] FDA and  has been authorized for detection and/or diagnosis of SARS-CoV-2 by  FDA under an Emergency Use Authorization (EUA). This EUA will remain  in effect (meaning this test can be used) for the duration of the  Covid-19 declaration under Section 564(b)(1) of the Act, 21  U.S.C. section 360bbb-3(b)(1), unless the authorization is  terminated or revoked. Performed at [facility], [address] [place], [place] [address]   Glucose, capillary     Status: Abnormal   Collection Time: 09/30/19  6:13 PM  Result Value Ref Range   Glucose-Capillary 61 (L)  70 - 99 mg/dL  Glucose, capillary     Status: None   Collection Time: 09/30/19  9:13 PM  Result Value Ref Range   Glucose-Capillary 72 70 - 99 mg/dL  Troponin I (High Sensitivity)     Status: None   Collection Time: 09/30/19  9:17 PM  Result Value Ref Range   Troponin I (High Sensitivity) 3 <18 ng/L    Comment: (NOTE) Elevated high sensitivity troponin I (hsTnI) values and significant  changes across serial measurements may suggest ACS but many other  chronic and acute conditions are known to elevate hsTnI results.  Refer to the "Links" section for chest pain algorithms and additional   guidance. Performed at [facility], [address] [place], [place] [address]   Influenza panel by PCR (type A & B)     Status: None   Collection Time: 09/30/19  9:26 PM  Result Value Ref Range   Influenza A By PCR NEGATIVE NEGATIVE   Influenza B By PCR NEGATIVE NEGATIVE    Comment: (NOTE) The Xpert Xpress Flu assay is intended as an aid in the diagnosis of  influenza and should not be used as a sole basis for treatment.  This  assay is FDA approved for nasopharyngeal swab specimens only. Nasal  washings and aspirates are unacceptable for Xpert Xpress Flu testing. Performed at [facility], [address] [place],  [address]    DG Chest 2 View  Result Date: 09/30/2019 CLINICAL DATA:  Chest pain and shortness of breath.  Renal failure EXAM: CHEST - 2 VIEW COMPARISON:  October 09, 2018 FINDINGS: There is ill-defined airspace opacity in the lateral right base. No other airspace opacity evident. There remains interstitial prominence with questionable interstitial edema. There is cardiomegaly with mild pulmonary venous hypertension. No adenopathy. Extensive stent from the left subclavian to the left brachial region noted. No bone lesions. No pneumothorax. IMPRESSION: Suspect small focus of pneumonia in the lateral right base. No other airspace opacity evident. Stable cardiomegaly with a degree of pulmonary vascular congestion. Interstitial prominence may represent mild chronic interstitial edema. This appearance is stable from 1 year prior. Extensive stent placement on the left. Electronically Signed   By: Lowella Grip III M.D.   On: 09/30/2019 12:19   VAS US DUPLEX DIALYSIS ACCESS (AVF, AVG)  Result Date: 09/29/2019 DIALYSIS ACCESS Reason for Exam: Routine follow up. Access Site: Left Upper Extremity. Access Type: Brachial-axillary AVG. History: 08/24/13: Left brachial-axillary AVG placement;          H/O AVG & outflow vein stents as well as DRIL;. Performing  Technologist: Blondell Reveal RT, RDMS, RVT  Examination Guidelines: A complete evaluation includes B-mode imaging, spectral Doppler, color Doppler, and power Doppler as needed of all accessible portions of each vessel. Unilateral testing is considered an integral part of a complete examination. Limited examinations for reoccurring indications may be performed as noted.  Findings:  +--------------------+----------+-----------------+--------+ AVG                 PSV (cm/s)Flow Vol (mL/min)Describe +--------------------+----------+-----------------+--------+ Native artery inflow   232          1251                +--------------------+----------+-----------------+--------+ Arterial anastomosis   459                              +--------------------+----------+-----------------+--------+ Prox graft  307                      stent   +--------------------+----------+-----------------+--------+ Mid graft              281                      stent   +--------------------+----------+-----------------+--------+ Distal graft           225                              +--------------------+----------+-----------------+--------+ Venous anastomosis     370                      stent   +--------------------+----------+-----------------+--------+ Venous outflow         142                      stent   +--------------------+----------+-----------------+--------+ Antegrade, biphasic flow in the left distal radial artery with velocities of 98cm/s at rest and 105cm/s during access compression. No significant velocity increase consistent with significant access steal. Patent DRIL graft noted.  Summary: Patent left brachial-axillary AVG with a normal Flow Volume and no significant stenosis.  *See table(s) above for measurements and observations.  Diagnosing physician: Hortencia Pilar MD Electronically signed by Hortencia Pilar MD on 09/29/2019 at 5:21:36 PM.    --------------------------------------------------------------------------------   Final    VAS US CAROTID  Result Date: 09/29/2019 Carotid Arterial Duplex Study Indications:   Carotid artery disease. Other Factors: Current left arm AVG. Performing Technologist: Blondell Reveal RT, RDMS, RVT  Examination Guidelines: A complete evaluation includes B-mode imaging, spectral Doppler, color Doppler, and power Doppler as needed of all accessible portions of each vessel. Bilateral testing is considered an integral part of a complete examination. Limited examinations for reoccurring indications may be performed as noted.  Right Carotid Findings: +----------+--------+--------+--------+------------------+-----------------+           PSV cm/sEDV cm/sStenosisPlaque DescriptionComments          +----------+--------+--------+--------+------------------+-----------------+ CCA Prox  81      0                                                   +----------+--------+--------+--------+------------------+-----------------+ CCA Mid   73      11                                                  +----------+--------+--------+--------+------------------+-----------------+ CCA Distal51      10              heterogenous                        +----------+--------+--------+--------+------------------+-----------------+ ICA Prox  434     87      80-99%  heterogenous      ICA/CCA ratio=5.9 +----------+--------+--------+--------+------------------+-----------------+ ICA Mid   174     29                                                  +----------+--------+--------+--------+------------------+-----------------+  ICA Distal98      16                                                  +----------+--------+--------+--------+------------------+-----------------+ ECA       127     0               heterogenous                         +----------+--------+--------+--------+------------------+-----------------+ +----------+--------+-------+----------------+-------------------+           PSV cm/sEDV cmsDescribe        Arm Pressure (mmHG) +----------+--------+-------+----------------+-------------------+ [id number]            Multiphasic, WNL                    +----------+--------+-------+----------------+-------------------+ +---------+--------+--+--------+---------+ VertebralPSV cm/s87EDV cm/sAntegrade +---------+--------+--+--------+---------+  Left Carotid Findings: +----------+--------+--------+--------+------------------+-----------------+           PSV cm/sEDV cm/sStenosisPlaque DescriptionComments          +----------+--------+--------+--------+------------------+-----------------+ CCA Prox  87      10                                                  +----------+--------+--------+--------+------------------+-----------------+ CCA Mid   69      11                                                  +----------+--------+--------+--------+------------------+-----------------+ CCA Distal55      11                                                  +----------+--------+--------+--------+------------------+-----------------+ ICA Prox  213     43      40-59%  heterogenous      ICA/CCA ratio=3.1 +----------+--------+--------+--------+------------------+-----------------+ ICA Mid   124     24                                                  +----------+--------+--------+--------+------------------+-----------------+ ICA Distal71      18                                                  +----------+--------+--------+--------+------------------+-----------------+ ECA       103     0                                                   +----------+--------+--------+--------+------------------+-----------------+  +----------+--------+--------+-------------------------+-------------------+  PSV cm/sEDV cm/sDescribe                 Arm Pressure (mmHG) +----------+--------+--------+-------------------------+-------------------+ Subclavian188             Monophasic, fistular flow                    +----------+--------+--------+-------------------------+-------------------+ +---------+--------+--+--------+---------+ VertebralPSV cm/s57EDV cm/sAntegrade +---------+--------+--+--------+---------+  Summary: Right Carotid: Velocities in the right ICA are consistent with a 80-99%                stenosis. Left Carotid: Velocities in the left ICA are consistent with a 40-59% stenosis.                No significant change noted when compared to the previous exam on               01/07/18. *See table(s) above for measurements and observations. Electronically signed by Hortencia Pilar MD on 09/29/2019 at 5:21:32 PM.    Final     Pending Labs Unresulted Labs (From admission, onward)    Start     Ordered   10/07/19 0500  Creatinine, serum  (enoxaparin (LOVENOX)    CrCl < 30 ml/min)  Weekly,   STAT    Comments: while on enoxaparin therapy.    09/30/19 2125   10/01/19 5009  Basic metabolic panel  Tomorrow morning,   STAT     09/30/19 1737   09/30/19 2126  HIV Antibody (routine testing w rflx)  (HIV Antibody (Routine testing w reflex) panel)  Once,   STAT     09/30/19 2125          Vitals/Pain Today's Vitals   09/30/19 2236 09/30/19 2321 10/01/19 0000 10/01/19 0018  BP:  (!) 153/54 (!) 172/59 (!) 172/59  Pulse: 67 (!) 59 (!) 104 61  Resp:   18   Temp:  98.7 F (37.1 C)  98 F (36.7 C)  TempSrc:  Oral  Oral  SpO2: 99% 100% 100% 100%  Weight:      Height:      PainSc:  5   5     Isolation Precautions No active isolations  Medications Medications  aspirin EC tablet 81 mg (81 mg Oral Given 09/30/19 2142)  amLODipine (NORVASC) tablet 10 mg (10 mg Oral Given 09/30/19 2141)  atorvastatin  (LIPITOR) tablet 20 mg (20 mg Oral Given 09/30/19 2141)  carvedilol (COREG) tablet 3.125 mg (has no administration in time range)  furosemide (LASIX) tablet 80 mg (80 mg Oral Given 09/30/19 2142)  nitroGLYCERIN (NITROSTAT) SL tablet 0.4 mg (has no administration in time range)  FLUoxetine (PROZAC) capsule 20 mg (20 mg Oral Refused 09/30/19 2149)  clopidogrel (PLAVIX) tablet 75 mg (75 mg Oral Given 09/30/19 2141)  ferrous sulfate tablet 325 mg (has no administration in time range)  multivitamin (RENA-VIT) tablet 1 tablet (1 tablet Oral Given 10/01/19 0017)  albuterol (VENTOLIN HFA) 108 (90 Base) MCG/ACT inhaler 2 puff (has no administration in time range)  enoxaparin (LOVENOX) injection 30 mg (30 mg Subcutaneous Given 10/01/19 0018)  insulin aspart (novoLOG) injection 0-5 Units (0 Units Subcutaneous Not Given 09/30/19 2308)  insulin aspart (novoLOG) injection 0-6 Units (has no administration in time range)  sevelamer carbonate (RENVELA) tablet 800 mg (has no administration in time range)  sevelamer carbonate (RENVELA) tablet 2,400 mg (has no administration in time range)  Levofloxacin (LEVAQUIN) IVPB 250 mg (has no administration in time range)  traMADol (ULTRAM) tablet 50 mg (has no administration  in time range)  diazepam (VALIUM) tablet 5 mg (5 mg Oral Given 09/30/19 1547)  levofloxacin (LEVAQUIN) tablet 500 mg (500 mg Oral Given 09/30/19 1547)    Mobility walks with device Moderate fall risk   Focused Assessments Cardiac Assessment Handoff:  Cardiac Rhythm: Other (Comment)(sinus brady) Lab Results  Component Value Date   CKTOTAL 58 06/18/2018   CKMB 0.9 12/02/2013   TROPONINI <0.03 10/05/2018   No results found for: DDIMER Does the Patient currently have chest pain? No  , Pulmonary Assessment Handoff:  Lung sounds: Bilateral Breath Sounds: Diminished, Expiratory wheezes O2 Device: Nasal Cannula O2 Flow Rate (L/min): 3 L/min      R Recommendations: See Admitting Provider Note  Report  given to:   Additional Notes: c/o back pain(chronic). Provider aware.

## 2019-10-01 NOTE — ED Notes (Signed)
Called and gave report to incoming RN. States pt is going to [facility]

## 2019-10-01 NOTE — Progress Notes (Signed)
Patient resting in bed. Patient to go to Hemodialysis today, consent signed. Bed low, alarm on, call bell in reach.

## 2019-10-01 NOTE — Progress Notes (Signed)
Post HD Tx Assessment   10/01/19 1520  Neurological  Level of Consciousness Alert  Orientation Level Oriented X4  Respiratory  Respiratory Pattern Regular;Unlabored  Chest Assessment Chest expansion symmetrical  Bilateral Breath Sounds Clear;Diminished  Cardiac  Pulse Regular  Heart Sounds S1, S2  Jugular Venous Distention (JVD) No  ECG Monitor Yes  Cardiac Rhythm NSR  Antiarrhythmic device No  Vascular  R Radial Pulse +2  L Radial Pulse +2  R Dorsalis Pedis Pulse +2  L Dorsalis Pedis Pulse +2  Integumentary  Integumentary (WDL) X  Skin Color Appropriate for ethnicity  Skin Condition Dry;Flaky  Skin Integrity Intact  Musculoskeletal  Musculoskeletal (WDL) X  Generalized Weakness Yes  Gastrointestinal  Bowel Sounds Assessment Active  Last BM Date 09/30/19  GU Assessment  Genitourinary (WDL) X  Genitourinary Symptoms Anuria (ESRD Pt)  Psychosocial  Psychosocial (WDL) X  Patient Behaviors Uncooperative  Needs Expressed Emotional  Emotional support given Given to patient

## 2019-10-01 NOTE — Progress Notes (Signed)
[facility]  ROUNDING NOTE   Subjective:   [name] admitted to [facility] on 09/30/2019 for Pneumonia [J18.9] CAP (community acquired pneumonia) [J18.9] ESRD on dialysis ([place]) [N18.6, Z99.2] HCAP (healthcare-associated pneumonia) [J18.9] Dyspnea, unspecified type [R06.00]  Last hemodialysis treatment was 1/6. Missed treatment yesterday as outpatient.   Placed on hemodialysis treatment today. Tolerating treatment well.     HEMODIALYSIS FLOWSHEET:  Blood Flow Rate (mL/min): 350 mL/min Arterial Pressure (mmHg): -150 mmHg Venous Pressure (mmHg): 220 mmHg Transmembrane Pressure (mmHg): 40 mmHg Ultrafiltration Rate (mL/min): 1000 mL/min Dialysate Flow Rate (mL/min): 600 ml/min Conductivity: Machine : 14 Conductivity: Machine : 14 Dialysis Fluid Bolus: Normal Saline Bolus Amount (mL): 250 mL    Objective:  Vital signs in last 24 hours:  Temp:  [97.5 F (36.4 C)-98.7 F (37.1 C)] 98.6 F (37 C) (01/09 1230) Pulse Rate:  [49-104] 68 (01/09 1500) Resp:  [10-29] 17 (01/09 1500) BP: (136-211)/(43-121) 149/59 (01/09 1500) SpO2:  [85 %-100 %] 100 % (01/09 1500) Weight:  [89.5 kg] 89.5 kg (01/09 0413)  Weight change:  Filed Weights   09/30/19 1053 10/01/19 0413  Weight: 89 kg 89.5 kg    Intake/Output: No intake/output data recorded.   Intake/Output this shift:  No intake/output data recorded.  Physical Exam: General: NAD,   Head: Normocephalic, atraumatic. Moist oral mucosal membranes  Eyes: Anicteric, PERRL  Neck: Supple, trachea midline  Lungs:  Bilateral crackles  Heart: Regular rate and rhythm  Abdomen:  Soft, nontender,   Extremities: + peripheral edema.  Neurologic: Nonfocal, moving all four extremities  Skin: No lesions  Access: Left AVF    Basic Metabolic Panel: Recent Labs  Lab 09/30/19 1100 10/01/19 0531  NA 131* 131*  K 4.1 4.3  CL 93* 95*  CO2 25 24  GLUCOSE 54* 201*  BUN 19 21*  CREATININE 6.57* 7.37*  CALCIUM 8.4* 7.9*     Liver Function Tests: No results for input(s): AST, ALT, ALKPHOS, BILITOT, PROT, ALBUMIN in the last 168 hours. No results for input(s): LIPASE, AMYLASE in the last 168 hours. No results for input(s): AMMONIA in the last 168 hours.  CBC: Recent Labs  Lab 09/30/19 1100  WBC 5.1  HGB 9.1*  HCT 28.4*  MCV 89.6  PLT 174    Cardiac Enzymes: No results for input(s): CKTOTAL, CKMB, CKMBINDEX, TROPONINI in the last 168 hours.  BNP: Invalid input(s): POCBNP  CBG: Recent Labs  Lab 09/30/19 1813 09/30/19 2113 10/01/19 0810  GLUCAP 61* 72 141*    Microbiology: Results for orders placed or performed during the hospital encounter of 09/30/19  Respiratory Panel by RT PCR (Flu A&B, Covid) - Nasopharyngeal Swab     Status: None   Collection Time: 09/30/19  5:30 PM   Specimen: Nasopharyngeal Swab  Result Value Ref Range Status   SARS Coronavirus 2 by RT PCR NEGATIVE NEGATIVE Final    Comment: (NOTE) SARS-CoV-2 target nucleic acids are NOT DETECTED. The SARS-CoV-2 RNA is generally detectable in upper respiratoy specimens during the acute phase of infection. The lowest concentration of SARS-CoV-2 viral copies this assay can detect is 131 copies/mL. A negative result does not preclude SARS-Cov-2 infection and should not be used as the sole basis for treatment or other patient management decisions. A negative result may occur with  improper specimen collection/handling, submission of specimen other than nasopharyngeal swab, presence of viral mutation(s) within the areas targeted by this assay, and inadequate number of viral copies (<131 copies/mL). A negative result must  be combined with clinical observations, patient history, and epidemiological information. The expected result is Negative. Fact Sheet for Patients:  PinkCheek.be Fact Sheet for Healthcare Providers:  GravelBags.it This test is not yet ap proved or  cleared by the [place] FDA and  has been authorized for detection and/or diagnosis of SARS-CoV-2 by FDA under an Emergency Use Authorization (EUA). This EUA will remain  in effect (meaning this test can be used) for the duration of the COVID-19 declaration under Section 564(b)(1) of the Act, 21 U.S.C. section 360bbb-3(b)(1), unless the authorization is terminated or revoked sooner.    Influenza A by PCR NEGATIVE NEGATIVE Final   Influenza B by PCR NEGATIVE NEGATIVE Final    Comment: (NOTE) The Xpert Xpress SARS-CoV-2/FLU/RSV assay is intended as an aid in  the diagnosis of influenza from Nasopharyngeal swab specimens and  should not be used as a sole basis for treatment. Nasal washings and  aspirates are unacceptable for Xpert Xpress SARS-CoV-2/FLU/RSV  testing. Fact Sheet for Patients: PinkCheek.be Fact Sheet for Healthcare Providers: GravelBags.it This test is not yet approved or cleared by the [place] FDA and  has been authorized for detection and/or diagnosis of SARS-CoV-2 by  FDA under an Emergency Use Authorization (EUA). This EUA will remain  in effect (meaning this test can be used) for the duration of the  Covid-19 declaration under Section 564(b)(1) of the Act, 21  U.S.C. section 360bbb-3(b)(1), unless the authorization is  terminated or revoked. Performed at [facility], [address] [place], [place] [address]     Coagulation Studies: No results for input(s): LABPROT, INR in the last 72 hours.  Urinalysis: No results for input(s): COLORURINE, LABSPEC, PHURINE, GLUCOSEU, HGBUR, BILIRUBINUR, KETONESUR, PROTEINUR, UROBILINOGEN, NITRITE, LEUKOCYTESUR in the last 72 hours.  Invalid input(s): APPERANCEUR    Imaging: DG Chest 2 View  Result Date: 09/30/2019 CLINICAL DATA:  Chest pain and shortness of breath.  Renal failure EXAM: CHEST - 2 VIEW COMPARISON:  October 09, 2018 FINDINGS: There  is ill-defined airspace opacity in the lateral right base. No other airspace opacity evident. There remains interstitial prominence with questionable interstitial edema. There is cardiomegaly with mild pulmonary venous hypertension. No adenopathy. Extensive stent from the left subclavian to the left brachial region noted. No bone lesions. No pneumothorax. IMPRESSION: Suspect small focus of pneumonia in the lateral right base. No other airspace opacity evident. Stable cardiomegaly with a degree of pulmonary vascular congestion. Interstitial prominence may represent mild chronic interstitial edema. This appearance is stable from 1 year prior. Extensive stent placement on the left. Electronically Signed   By: Lowella Grip III M.D.   On: 09/30/2019 12:19     Medications:   . [START ON 10/02/2019] levofloxacin (LEVAQUIN) IV     . amLODipine  10 mg Oral Daily  . aspirin EC  81 mg Oral Daily  . atorvastatin  20 mg Oral QPM  . carvedilol  3.125 mg Oral BID WC  . clopidogrel  75 mg Oral Daily  . ferrous sulfate  325 mg Oral Q breakfast  . FLUoxetine  20 mg Oral QHS  . furosemide  80 mg Oral Daily  . heparin injection (subcutaneous)  5,000 Units Subcutaneous Q8H  . insulin aspart  0-5 Units Subcutaneous QHS  . insulin aspart  0-6 Units Subcutaneous TID WC  . multivitamin  1 tablet Oral Daily  . sevelamer carbonate  2,400 mg Oral TID WC  . sevelamer carbonate  800 mg Oral With snacks  albuterol, nitroGLYCERIN, traMADol  Assessment/ Plan:  [name] is a 59 y.o. black female with end stage renal disease on hemodialysis for 14 years, hypertension, hyperlipidemia, diabetes mellitus type II insulin dependent, depression, diabetic gastroparesis, diabetic neuropathy, COPD, coronary artery disease who is admitted to [facility] on 09/30/2019 for Pneumonia [J18.9] CAP (community acquired pneumonia) [J18.9] ESRD on dialysis ([place]) [N18.6, Z99.2] HCAP (healthcare-associated pneumonia) [J18.9] Dyspnea,  unspecified type [R06.00]  [facility] left AVG 89.5kg  1. End Stage Renal Disease: place on hemodialysis today. Seen and examined, tolerating treatment well. Some difficulty with AVG access at first. Missed treatment on Friday.  Next treatment for Monday. Resume MWF schedule.   2. Hypertension: 149/59 - carvedilol and amlodipine.   3. Anemia of chronic kidney disease: hemoglobin 9.1. EPO and iron supplements  4. Secondary Hyperparathyroidism:  - sevelamer with meals.    LOS: 0 Kylena Mole 1/9/20213:43 PM

## 2019-10-02 DIAGNOSIS — M79606 Pain in leg, unspecified: Secondary | ICD-10-CM | POA: Insufficient documentation

## 2019-10-11 ENCOUNTER — Other Ambulatory Visit (INDEPENDENT_AMBULATORY_CARE_PROVIDER_SITE_OTHER): Payer: Self-pay | Admitting: Vascular Surgery

## 2019-10-11 ENCOUNTER — Other Ambulatory Visit: Payer: Self-pay

## 2019-10-11 ENCOUNTER — Telehealth: Payer: Self-pay | Admitting: *Deleted

## 2019-10-11 ENCOUNTER — Inpatient Hospital Stay: Payer: Medicare Other | Attending: Oncology

## 2019-10-11 ENCOUNTER — Inpatient Hospital Stay (HOSPITAL_BASED_OUTPATIENT_CLINIC_OR_DEPARTMENT_OTHER): Payer: Medicare Other | Admitting: Oncology

## 2019-10-11 ENCOUNTER — Encounter: Payer: Self-pay | Admitting: Oncology

## 2019-10-11 VITALS — BP 144/53 | HR 64 | Temp 98.0°F | Resp 18 | Wt 196.0 lb

## 2019-10-11 DIAGNOSIS — I70213 Atherosclerosis of native arteries of extremities with intermittent claudication, bilateral legs: Secondary | ICD-10-CM

## 2019-10-11 DIAGNOSIS — E1122 Type 2 diabetes mellitus with diabetic chronic kidney disease: Secondary | ICD-10-CM | POA: Diagnosis not present

## 2019-10-11 DIAGNOSIS — D631 Anemia in chronic kidney disease: Secondary | ICD-10-CM

## 2019-10-11 DIAGNOSIS — Z79899 Other long term (current) drug therapy: Secondary | ICD-10-CM | POA: Insufficient documentation

## 2019-10-11 DIAGNOSIS — Z7982 Long term (current) use of aspirin: Secondary | ICD-10-CM | POA: Insufficient documentation

## 2019-10-11 DIAGNOSIS — R5381 Other malaise: Secondary | ICD-10-CM | POA: Insufficient documentation

## 2019-10-11 DIAGNOSIS — R5383 Other fatigue: Secondary | ICD-10-CM | POA: Insufficient documentation

## 2019-10-11 DIAGNOSIS — D472 Monoclonal gammopathy: Secondary | ICD-10-CM

## 2019-10-11 DIAGNOSIS — Z87891 Personal history of nicotine dependence: Secondary | ICD-10-CM | POA: Diagnosis not present

## 2019-10-11 DIAGNOSIS — N189 Chronic kidney disease, unspecified: Secondary | ICD-10-CM

## 2019-10-11 DIAGNOSIS — Z992 Dependence on renal dialysis: Secondary | ICD-10-CM | POA: Insufficient documentation

## 2019-10-11 DIAGNOSIS — I252 Old myocardial infarction: Secondary | ICD-10-CM | POA: Insufficient documentation

## 2019-10-11 DIAGNOSIS — I129 Hypertensive chronic kidney disease with stage 1 through stage 4 chronic kidney disease, or unspecified chronic kidney disease: Secondary | ICD-10-CM | POA: Insufficient documentation

## 2019-10-11 DIAGNOSIS — N185 Chronic kidney disease, stage 5: Secondary | ICD-10-CM

## 2019-10-11 NOTE — Telephone Encounter (Signed)
Dr. Janese Banks wanted to know what her last 3 hgb were and it was : 1/13 8.6, 12/30- 8.3, 12/16 6.9. She receives epogen 7,800 units three times a week.

## 2019-10-11 NOTE — Progress Notes (Signed)
Pt was unable to get blood draw today-she has fistula old one on one side and new one on the other arm.

## 2019-10-13 ENCOUNTER — Other Ambulatory Visit: Payer: Self-pay

## 2019-10-13 ENCOUNTER — Encounter (INDEPENDENT_AMBULATORY_CARE_PROVIDER_SITE_OTHER): Payer: Self-pay | Admitting: Vascular Surgery

## 2019-10-13 ENCOUNTER — Ambulatory Visit (INDEPENDENT_AMBULATORY_CARE_PROVIDER_SITE_OTHER): Payer: Medicare Other

## 2019-10-13 ENCOUNTER — Ambulatory Visit (INDEPENDENT_AMBULATORY_CARE_PROVIDER_SITE_OTHER): Payer: Medicare Other | Admitting: Vascular Surgery

## 2019-10-13 VITALS — BP 155/69 | HR 65 | Resp 16 | Wt 193.0 lb

## 2019-10-13 DIAGNOSIS — I70213 Atherosclerosis of native arteries of extremities with intermittent claudication, bilateral legs: Secondary | ICD-10-CM

## 2019-10-13 DIAGNOSIS — M79604 Pain in right leg: Secondary | ICD-10-CM | POA: Diagnosis not present

## 2019-10-13 DIAGNOSIS — I872 Venous insufficiency (chronic) (peripheral): Secondary | ICD-10-CM | POA: Diagnosis not present

## 2019-10-13 DIAGNOSIS — M79605 Pain in left leg: Secondary | ICD-10-CM

## 2019-10-13 DIAGNOSIS — I6523 Occlusion and stenosis of bilateral carotid arteries: Secondary | ICD-10-CM

## 2019-10-13 DIAGNOSIS — I10 Essential (primary) hypertension: Secondary | ICD-10-CM

## 2019-10-13 DIAGNOSIS — I890 Lymphedema, not elsewhere classified: Secondary | ICD-10-CM

## 2019-10-13 DIAGNOSIS — J449 Chronic obstructive pulmonary disease, unspecified: Secondary | ICD-10-CM

## 2019-10-13 NOTE — Progress Notes (Signed)
MRN : [id number]  [name] is a 59 y.o. (July 11, 1961) female who presents with chief complaint of No chief complaint on file. Marland Kitchen  History of Present Illness:  The patient returns to the office for followup and review of the noninvasive studies. There have been no interval changes in lower extremity symptoms. No interval shortening of the patient's claudication distance or development of rest pain symptoms. No new ulcers or wounds have occurred since the last visit.  There have been no significant changes to the patient's overall health care.  The patient denies amaurosis fugax or recent TIA symptoms. There are no recent neurological changes noted. The patient denies history of DVT, PE or superficial thrombophlebitis. The patient denies recent episodes of angina or shortness of breath.   ABI are [place] TBI's Rt=0.99 and Lt=0.94  (no change from previous study) Duplex ultrasound of the aorta iliac system does not show any hemodynamically significant stenosis.   No outpatient medications have been marked as taking for the 10/13/19 encounter (Appointment) with Delana Meyer, Dolores Lory, MD.    Past Medical History:  Diagnosis Date  . Anemia   . Anginal pain ([place])   . Anxiety   . Arthritis   . Asthma   . Broken wrist   . Bronchitis   . chronic diastolic CHF 1/32/4401  . COPD (chronic obstructive pulmonary disease) ([place])   . Coronary artery disease    a. cath 2013: stenting to RCA (report not available); b. cath 2014: LM nl, pLAD 40%, mLAD nl, ost LCx 40%, mid LCx nl, pRCA 30% @ site of prior stent, mRCA 50%  . Depression   . Diabetes mellitus without complication ([place])   . Diabetic neuropathy ([place])   . dialysis 2006  . Diverticulosis   . Dizziness   . Dyspnea   . Elevated lipids   . Environmental and seasonal allergies   . ESRD (end stage renal disease) on dialysis ([place])    M-W-F  . GERD (gastroesophageal reflux disease)   . Headache   . History of anemia due to chronic kidney  disease   . History of hiatal hernia   . HOH (hard of hearing)   . Hx of pancreatitis 2015  . Hypertension   . Lower extremity edema   . Mitral regurgitation    a. echo 10/2013: EF 62%, noWMA, mildly dilated LA, mild to mod MR/TR, GR1DD  . Myocardial infarction ([place])   . Orthopnea   . Pneumonia   . Renal cancer ([place])   . Renal insufficiency    Pt is on dialysis on M,W + F.  . Wheezing     Past Surgical History:  Procedure Laterality Date  . A/V SHUNTOGRAM Left 01/20/2018   Procedure: A/V SHUNTOGRAM;  Surgeon: Algernon Huxley, MD;  Location: [place] [facility];  Service: Cardiovascular;  Laterality: Left;  . ABDOMINAL HYSTERECTOMY  1992  . AMPUTATION TOE Left 10/02/2017   Procedure: AMPUTATION TOE-LEFT GREAT TOE;  Surgeon: Albertine Patricia, DPM;  Location: [facility];  Service: Podiatry;  Laterality: Left;  . APPENDECTOMY    . ARTERY BIOPSY Right 10/11/2018   Procedure: BIOPSY TEMPORAL ARTERY;  Surgeon: Vickie Epley, MD;  Location: [facility];  Service: General;  Laterality: Right;  . CARDIAC CATHETERIZATION Left 07/26/2015   Procedure: Left Heart Cath and Coronary Angiography;  Surgeon: Dionisio David, MD;  Location: [place] [facility];  Service: Cardiovascular;  Laterality: Left;  . CATARACT EXTRACTION W/ INTRAOCULAR LENS IMPLANT Right   .  CATARACT EXTRACTION W/PHACO Left 03/10/2017   Procedure: CATARACT EXTRACTION PHACO AND INTRAOCULAR LENS PLACEMENT (IOC);  Surgeon: Birder Robson, MD;  Location: [facility];  Service: Ophthalmology;  Laterality: Left;  [place] 00:51.9 AP% 14.2 CDE 7.39 fluid pack lot # [id number]  . CHOLECYSTECTOMY    . COLONOSCOPY WITH PROPOFOL N/A 08/12/2016   Procedure: COLONOSCOPY WITH PROPOFOL;  Surgeon: Lollie Sails, MD;  Location: [facility]OSCOPY;  Service: Endoscopy;  Laterality: N/A;  . DIALYSIS FISTULA CREATION Left    upper arm  . dialysis grafts    . ESOPHAGOGASTRODUODENOSCOPY N/A 03/08/2015   Procedure: ESOPHAGOGASTRODUODENOSCOPY (EGD);  Surgeon:  Manya Silvas, MD;  Location: [facility];  Service: Endoscopy;  Laterality: N/A;  . ESOPHAGOGASTRODUODENOSCOPY (EGD) WITH PROPOFOL N/A 03/18/2016   Procedure: ESOPHAGOGASTRODUODENOSCOPY (EGD) WITH PROPOFOL;  Surgeon: [name], MD;  Location: [facility] ENDOSCOPY;  Service: Endoscopy;  Laterality: N/A;  . EYE SURGERY Right 2018  . FECAL TRANSPLANT N/A 08/23/2015   Procedure: FECAL TRANSPLANT;  Surgeon: Manya Silvas, MD;  Location: [facility] ENDOSCOPY;  Service: Endoscopy;  Laterality: N/A;  . HAND SURGERY Bilateral   . IR RADIOLOGIST EVAL & MGMT  07/28/2019  . IR RADIOLOGIST EVAL & MGMT  08/11/2019  . PERIPHERAL VASCULAR CATHETERIZATION N/A 12/20/2015   Procedure: Thrombectomy of dialysis access versus permcath placement;  Surgeon: Algernon Huxley, MD;  Location: [place] [facility];  Service: Cardiovascular;  Laterality: N/A;  . PERIPHERAL VASCULAR CATHETERIZATION N/A 12/20/2015   Procedure: A/V Shunt Intervention;  Surgeon: Algernon Huxley, MD;  Location: [place] [facility];  Service: Cardiovascular;  Laterality: N/A;  . PERIPHERAL VASCULAR CATHETERIZATION N/A 12/20/2015   Procedure: A/V Shuntogram/Fistulagram;  Surgeon: Algernon Huxley, MD;  Location: [place] [facility];  Service: Cardiovascular;  Laterality: N/A;  . PERIPHERAL VASCULAR CATHETERIZATION N/A 01/02/2016   Procedure: A/V Shuntogram/Fistulagram;  Surgeon: Algernon Huxley, MD;  Location: [place] [facility];  Service: Cardiovascular;  Laterality: N/A;  . PERIPHERAL VASCULAR CATHETERIZATION N/A 01/02/2016   Procedure: A/V Shunt Intervention;  Surgeon: Algernon Huxley, MD;  Location: [place] [facility];  Service: Cardiovascular;  Laterality: N/A;    Social History Social History   Tobacco Use  . Smoking status: Former Smoker    Packs/day: 0.50    Years: 40.00    Pack years: 20.00    Types: Cigarettes    Quit date: 02/13/2015    Years since quitting: 4.6  . Smokeless tobacco: Never Used  Substance Use Topics  . Alcohol use: Not Currently     Comment: glass wine week per pt  . Drug use: Yes    Types: Marijuana    Comment: once a day    Family History Family History  Problem Relation Age of Onset  . Kidney disease Mother   . Diabetes Mother   . Cancer Father   . Kidney disease Sister     Allergies  Allergen Reactions  . Ace Inhibitors Swelling and Anaphylaxis  . Ativan [Lorazepam] Other (See Comments)    Reaction:Hallucinations and headaches  . Compazine [Prochlorperazine Edisylate] Anaphylaxis, Nausea And Vomiting and Other (See Comments)    Other reaction(s): dystonia from this vs. Reglan, 23 Jul - patient relates that she takes promethazine frequently with no problems  . Sumatriptan Succinate Other (See Comments)    Other reaction(s): delirium and hallucinations per [facility] records  . Dilaudid [Hydromorphone Hcl] Other (See Comments)    Delirium   . Ondansetron Other (See Comments)    hallucinations    .  Zofran [Ondansetron Hcl] Other (See Comments)    Reaction:  hallucinations   . Codeine Nausea And Vomiting  . Gabapentin Other (See Comments)    Reaction:  Unknown    . [place] Nausea And Vomiting  . Losartan Nausea Only  . Oxycodone Anxiety  . Prochlorperazine Other (See Comments)    Reaction:  Unknown . Patient does not remember reaction but she does have vertigo and anxiety along with n and v at times. Could be used to treat any of these   . Reglan [Metoclopramide] Other (See Comments)    Per patient her Dr. Evelina Bucy her off it   . Scopolamine Other (See Comments)    Dizziness, also has vertigo already  . Tape Rash  . Tapentadol Rash     REVIEW OF SYSTEMS (Negative unless checked)  Constitutional: [] Weight loss  [] Fever  [] Chills Cardiac: [] Chest pain   [] Chest pressure   [] Palpitations   [] Shortness of breath when laying flat   [] Shortness of breath with exertion. Vascular:  [] Pain in legs with walking   [] Pain in legs at rest  [] History of DVT   [] Phlebitis   [x] Swelling in legs   [] Varicose  veins   [] Non-healing ulcers Pulmonary:   [] Uses home oxygen   [] Productive cough   [] Hemoptysis   [] Wheeze  [x] COPD   [] Asthma Neurologic:  [] Dizziness   [] Seizures   [] History of stroke   [] History of TIA  [] Aphasia   [] Vissual changes   [] Weakness or numbness in arm   [] Weakness or numbness in leg Musculoskeletal:   [] Joint swelling   [x] Joint pain   [x] Low back pain Hematologic:  [] Easy bruising  [] Easy bleeding   [] Hypercoagulable state   [] Anemic Gastrointestinal:  [] Diarrhea   [] Vomiting  [] Gastroesophageal reflux/heartburn   [] Difficulty swallowing. Genitourinary:  [x] Chronic kidney disease   [] Difficult urination  [] Frequent urination   [] Blood in urine Skin:  [] Rashes   [] Ulcers  Psychological:  [] History of anxiety   []  History of major depression.  Physical Examination  There were no vitals filed for this visit. There is no height or weight on file to calculate BMI. Gen: WD/WN, NAD Head: [place]/AT, No temporalis wasting.  Ear/Nose/Throat: Hearing grossly intact, nares w/o erythema or drainage Eyes: PER, EOMI, sclera nonicteric.  Neck: Supple, no large masses.   Pulmonary:  Good air movement, no audible wheezing bilaterally, no use of accessory muscles.  Cardiac: RRR, no JVD Vascular: right carotid bruit; scattered varicosities present bilaterally.  Mild venous stasis changes to the legs bilaterally.  2-3+ soft pitting edema with compression socks on today Vessel Right Left  Radial Palpable Palpable  PT Not Palpable Not Palpable  DP Not Palpable Not Palpable  Gastrointestinal: Non-distended. No guarding/no peritoneal signs.  Musculoskeletal: M/S 5/5 throughout.  No deformity or atrophy.  Neurologic: CN 2-12 intact. Symmetrical.  Speech is fluent. Motor exam as listed above. Psychiatric: Judgment intact, Mood & affect appropriate for pt's clinical situation. Dermatologic: No rashes or ulcers noted.  No changes consistent with cellulitis. Lymph : No lichenification or skin changes  of chronic lymphedema.  CBC Lab Results  Component Value Date   WBC 5.1 09/30/2019   HGB 9.1 (L) 09/30/2019   HCT 28.4 (L) 09/30/2019   MCV 89.6 09/30/2019   PLT 174 09/30/2019    BMET    Component Value Date/Time   NA 131 (L) 10/01/2019 0531   NA 135 (L) 09/15/2014 0948   K 4.3 10/01/2019 0531   K 4.8 09/15/2014 4650  CL 95 (L) 10/01/2019 0531   CL 99 09/15/2014 0948   CO2 24 10/01/2019 0531   CO2 26 09/15/2014 0948   GLUCOSE 201 (H) 10/01/2019 0531   GLUCOSE 118 (H) 09/15/2014 0948   BUN 21 (H) 10/01/2019 0531   BUN 19 (H) 09/15/2014 0948   CREATININE 7.37 (H) 10/01/2019 0531   CREATININE 6.79 (H) 09/15/2014 0948   CALCIUM 7.9 (L) 10/01/2019 0531   CALCIUM 8.3 (L) 09/15/2014 0948   GFRNONAA 5 (L) 10/01/2019 0531   GFRNONAA 7 (L) 09/15/2014 0948   GFRNONAA 6 (L) 05/31/2014 0432   GFRAA 6 (L) 10/01/2019 0531   GFRAA 8 (L) 09/15/2014 0948   GFRAA 7 (L) 05/31/2014 0432   Estimated Creatinine Clearance: 9 mL/min (A) (by C-G formula based on SCr of 7.37 mg/dL (H)).  COAG Lab Results  Component Value Date   INR 0.93 09/30/2017   INR 1.08 04/21/2016   INR 0.97 12/28/2015    Radiology DG Chest 2 View  Result Date: 09/30/2019 CLINICAL DATA:  Chest pain and shortness of breath.  Renal failure EXAM: CHEST - 2 VIEW COMPARISON:  October 09, 2018 FINDINGS: There is ill-defined airspace opacity in the lateral right base. No other airspace opacity evident. There remains interstitial prominence with questionable interstitial edema. There is cardiomegaly with mild pulmonary venous hypertension. No adenopathy. Extensive stent from the left subclavian to the left brachial region noted. No bone lesions. No pneumothorax. IMPRESSION: Suspect small focus of pneumonia in the lateral right base. No other airspace opacity evident. Stable cardiomegaly with a degree of pulmonary vascular congestion. Interstitial prominence may represent mild chronic interstitial edema. This appearance is  stable from 1 year prior. Extensive stent placement on the left. Electronically Signed   By: Lowella Grip III M.D.   On: 09/30/2019 12:19   VAS US DUPLEX DIALYSIS ACCESS (AVF, AVG)  Result Date: 09/29/2019 DIALYSIS ACCESS Reason for Exam: Routine follow up. Access Site: Left Upper Extremity. Access Type: Brachial-axillary AVG. History: 08/24/13: Left brachial-axillary AVG placement;          H/O AVG & outflow vein stents as well as DRIL;. Performing Technologist: Blondell Reveal RT, RDMS, RVT  Examination Guidelines: A complete evaluation includes B-mode imaging, spectral Doppler, color Doppler, and power Doppler as needed of all accessible portions of each vessel. Unilateral testing is considered an integral part of a complete examination. Limited examinations for reoccurring indications may be performed as noted.  Findings:  +--------------------+----------+-----------------+--------+ AVG                 PSV (cm/s)Flow Vol (mL/min)Describe +--------------------+----------+-----------------+--------+ Native artery inflow   232          1251                +--------------------+----------+-----------------+--------+ Arterial anastomosis   459                              +--------------------+----------+-----------------+--------+ Prox graft             307                      stent   +--------------------+----------+-----------------+--------+ Mid graft              281                      stent   +--------------------+----------+-----------------+--------+ Distal graft  225                              +--------------------+----------+-----------------+--------+ Venous anastomosis     370                      stent   +--------------------+----------+-----------------+--------+ Venous outflow         142                      stent   +--------------------+----------+-----------------+--------+ Antegrade, biphasic flow in the left distal radial artery with  velocities of 98cm/s at rest and 105cm/s during access compression. No significant velocity increase consistent with significant access steal. Patent DRIL graft noted.  Summary: Patent left brachial-axillary AVG with a normal Flow Volume and no significant stenosis.  *See table(s) above for measurements and observations.  Diagnosing physician: Hortencia Pilar MD Electronically signed by Hortencia Pilar MD on 09/29/2019 at 5:21:36 PM.   --------------------------------------------------------------------------------   Final    VAS US CAROTID  Result Date: 09/29/2019 Carotid Arterial Duplex Study Indications:   Carotid artery disease. Other Factors: Current left arm AVG. Performing Technologist: Blondell Reveal RT, RDMS, RVT  Examination Guidelines: A complete evaluation includes B-mode imaging, spectral Doppler, color Doppler, and power Doppler as needed of all accessible portions of each vessel. Bilateral testing is considered an integral part of a complete examination. Limited examinations for reoccurring indications may be performed as noted.  Right Carotid Findings: +----------+--------+--------+--------+------------------+-----------------+           PSV cm/sEDV cm/sStenosisPlaque DescriptionComments          +----------+--------+--------+--------+------------------+-----------------+ CCA Prox  81      0                                                   +----------+--------+--------+--------+------------------+-----------------+ CCA Mid   73      11                                                  +----------+--------+--------+--------+------------------+-----------------+ CCA Distal51      10              heterogenous                        +----------+--------+--------+--------+------------------+-----------------+ ICA Prox  434     87      80-99%  heterogenous      ICA/CCA ratio=5.9 +----------+--------+--------+--------+------------------+-----------------+ ICA Mid   174      29                                                  +----------+--------+--------+--------+------------------+-----------------+ ICA Distal98      16                                                  +----------+--------+--------+--------+------------------+-----------------+ ECA  127     0               heterogenous                        +----------+--------+--------+--------+------------------+-----------------+ +----------+--------+-------+----------------+-------------------+           PSV cm/sEDV cmsDescribe        Arm Pressure (mmHG) +----------+--------+-------+----------------+-------------------+ [id number]            Multiphasic, WNL                    +----------+--------+-------+----------------+-------------------+ +---------+--------+--+--------+---------+ VertebralPSV cm/s87EDV cm/sAntegrade +---------+--------+--+--------+---------+  Left Carotid Findings: +----------+--------+--------+--------+------------------+-----------------+           PSV cm/sEDV cm/sStenosisPlaque DescriptionComments          +----------+--------+--------+--------+------------------+-----------------+ CCA Prox  87      10                                                  +----------+--------+--------+--------+------------------+-----------------+ CCA Mid   69      11                                                  +----------+--------+--------+--------+------------------+-----------------+ CCA Distal55      11                                                  +----------+--------+--------+--------+------------------+-----------------+ ICA Prox  213     43      40-59%  heterogenous      ICA/CCA ratio=3.1 +----------+--------+--------+--------+------------------+-----------------+ ICA Mid   124     24                                                  +----------+--------+--------+--------+------------------+-----------------+ ICA Distal71       18                                                  +----------+--------+--------+--------+------------------+-----------------+ ECA       103     0                                                   +----------+--------+--------+--------+------------------+-----------------+ +----------+--------+--------+-------------------------+-------------------+           PSV cm/sEDV cm/sDescribe                 Arm Pressure (mmHG) +----------+--------+--------+-------------------------+-------------------+ [id number]             Monophasic, fistular flow                    +----------+--------+--------+-------------------------+-------------------+ +---------+--------+--+--------+---------+ VertebralPSV cm/s57EDV cm/sAntegrade +---------+--------+--+--------+---------+  Summary: Right Carotid: Velocities in the right ICA are consistent with a 80-99%                stenosis. Left Carotid: Velocities in the left ICA are consistent with a 40-59% stenosis.                No significant change noted when compared to the previous exam on               01/07/18. *See table(s) above for measurements and observations. Electronically signed by Hortencia Pilar MD on 09/29/2019 at 5:21:32 PM.    Final       Assessment/Plan 1. Pain in both lower extremities The patient's noninvasive studies demonstrate more than adequate perfusion.  It does not appear that her atherosclerotic occlusive disease is contributing significantly to her leg weakness.  I would move forward with evaluation by a spine specialist  2. Bilateral carotid artery stenosis Patient has not had her CT scans yet as she did not get a call from scheduling.  We will move forward with scheduling and then she will follow-up with me to review her CT scan  3. Chronic venous insufficiency She is now wearing graduated compression socks and has already noted a significant improvement in marked decrease in the pain and tightness of her  ankles.  4. Lymphedema She has been contacted by the lymphedema pump company and has set up a meeting with the representative for later today or tomorrow  5. Chronic obstructive pulmonary disease, unspecified COPD type ([place]) Continue pulmonary medications and aerosols as already ordered, these medications have been reviewed and there are no changes at this time.    6. Essential hypertension Continue antihypertensive medications as already ordered, these medications have been reviewed and there are no changes at this time.    Hortencia Pilar, MD  10/13/2019 8:54 AM

## 2019-10-24 ENCOUNTER — Other Ambulatory Visit: Payer: Self-pay

## 2019-10-24 DIAGNOSIS — I6523 Occlusion and stenosis of bilateral carotid arteries: Secondary | ICD-10-CM

## 2019-10-24 NOTE — Telephone Encounter (Signed)
Please send my note to nephrology and ask them to do the labs mentioned in my note. Also ask them if they can increase her epo dose. I would like repeat cbc in 3 months followed by video visit. If video is not possible then in person visit. If she remains anemic despite increasing epo dose, I need to discuss bone marrow biopsy

## 2019-10-24 NOTE — Progress Notes (Signed)
Hematology/Oncology Consult note [facility]  [phone] [phone]  Patient Care Team: Freddy Finner, NP as PCP - General (Nurse Practitioner) Lavonia Dana, MD as Consulting Physician (Internal Medicine)   Name of the patient: [name]  [id number]  1960/10/04   Date of visit: 10/24/19  Diagnosis- anemia of chronic kidney disease MGUS  Chief complaint/ Reason for visit- routine f/u of anemia of CKD  Heme/Onc history: patient is a 59 year-old female with a past medical history significant for coronary artery disease, type 2 diabetes with neuropathy, hypertension hyperlipidemia and osteopenia arthritis. She has ESRD secondary to HTN/DM and has been on dialysis for about 14 years. She was seen by rheumatology for complaints of low back pain. She also had painful subcutaneous skin rashes and was seen by dermatology and biopsy of one of the lesions showed mild septal panicky like this. As a part of workup by rheumatology doctor Dr. Lezlie Lye- Meda Coffee she had SPEP done which came back abnormal with the presence of M spike and hence has been referred to [place]. There was still some ongoing concern about possible lupus and she follows up with rheumatology. Her last colonoscopy was in November 2017 which showed tubular adenoma and a repeat colonoscopy was recommended in 5 years.  CBC from 01/08/2017 showed white count of 6.7, H&H of 13.9/42.8 and a platelet count of 161. CMP showed elevated BUN of 43 and creatinine of 5.22. Calcium was normal at 9.7. Total protein was mildly elevated at 8.6. An albumin was normal at 3.9. SPEP from 11/06/2016 did not reveal any M spike. M spike of 4.2% was noted on 24-hour urine protein.A lambda free light chain ratio was normal although both And lambda were elevated due to chronic kidney disease.she gets epo through dialysis. She also has kidney mass suspicious for RCC being followed by Dr. Erlene Quan   Interval  history-patient reports ongoing fatigue.  Her hemoglobin did drop down Recently and she needed a blood transfusion a month ago when it was 6.3.  ECOG PS- 2 Pain scale- 0   Review of systems- Review of Systems  Constitutional: Positive for malaise/fatigue. Negative for chills, fever and weight loss.  HENT: Negative for congestion, ear discharge and nosebleeds.   Eyes: Negative for blurred vision.  Respiratory: Negative for cough, hemoptysis, sputum production, shortness of breath and wheezing.   Cardiovascular: Negative for chest pain, palpitations, orthopnea and claudication.  Gastrointestinal: Negative for abdominal pain, blood in stool, constipation, diarrhea, heartburn, melena, nausea and vomiting.  Genitourinary: Negative for dysuria, flank pain, frequency, hematuria and urgency.  Musculoskeletal: Negative for back pain, joint pain and myalgias.  Skin: Negative for rash.  Neurological: Negative for dizziness, tingling, focal weakness, seizures, weakness and headaches.  Endo/Heme/Allergies: Does not bruise/bleed easily.  Psychiatric/Behavioral: Negative for depression and suicidal ideas. The patient does not have insomnia.       Allergies  Allergen Reactions   Ace Inhibitors Swelling and Anaphylaxis   Ativan [Lorazepam] Other (See Comments)    Reaction:Hallucinations and headaches   Compazine [Prochlorperazine Edisylate] Anaphylaxis, Nausea And Vomiting and Other (See Comments)    Other reaction(s): dystonia from this vs. Reglan, 23 Jul - patient relates that she takes promethazine frequently with no problems   Sumatriptan Succinate Other (See Comments)    Other reaction(s): delirium and hallucinations per [facility] records   Dilaudid [Hydromorphone Hcl] Other (See Comments)    Delirium    Ondansetron Other (See Comments)    hallucinations  Zofran [Ondansetron Hcl] Other (See Comments)    Reaction:  hallucinations    Codeine Nausea And Vomiting   Gabapentin Other  (See Comments)    Reaction:  Unknown     Lac Bovis Nausea And Vomiting   Losartan Nausea Only   Oxycodone Anxiety   Prochlorperazine Other (See Comments)    Reaction:  Unknown . Patient does not remember reaction but she does have vertigo and anxiety along with n and v at times. Could be used to treat any of these    Reglan [Metoclopramide] Other (See Comments)    Per patient her Dr. Evelina Bucy her off it    Scopolamine Other (See Comments)    Dizziness, also has vertigo already   Tape Rash   Tapentadol Rash     Past Medical History:  Diagnosis Date   Anemia    Anginal pain ([place])    Anxiety    Arthritis    Asthma    Broken wrist    Bronchitis    chronic diastolic CHF 6/81/2751   COPD (chronic obstructive pulmonary disease) ([place])    Coronary artery disease    a. cath 2013: stenting to RCA (report not available); b. cath 2014: LM nl, pLAD 40%, mLAD nl, ost LCx 40%, mid LCx nl, pRCA 30% @ site of prior stent, mRCA 50%   Depression    Diabetes mellitus without complication ([place])    Diabetic neuropathy ([place])    dialysis 2006   Diverticulosis    Dizziness    Dyspnea    Elevated lipids    Environmental and seasonal allergies    ESRD (end stage renal disease) on dialysis ([place])    M-W-F   GERD (gastroesophageal reflux disease)    Headache    History of anemia due to chronic kidney disease    History of hiatal hernia    HOH (hard of hearing)    Hx of pancreatitis 2015   Hypertension    Lower extremity edema    Mitral regurgitation    a. echo 10/2013: EF 62%, noWMA, mildly dilated LA, mild to mod MR/TR, GR1DD   Myocardial infarction ([place])    Orthopnea    Pneumonia    Renal cancer ([place])    Renal insufficiency    Pt is on dialysis on M,W + F.   Wheezing      Past Surgical History:  Procedure Laterality Date   A/V SHUNTOGRAM Left 01/20/2018   Procedure: A/V SHUNTOGRAM;  Surgeon: Algernon Huxley, MD;  Location: [place] [facility];   Service: Cardiovascular;  Laterality: Left;   ABDOMINAL HYSTERECTOMY  1992   AMPUTATION TOE Left 10/02/2017   Procedure: AMPUTATION TOE-LEFT GREAT TOE;  Surgeon: Albertine Patricia, DPM;  Location: [facility];  Service: Podiatry;  Laterality: Left;   APPENDECTOMY     ARTERY BIOPSY Right 10/11/2018   Procedure: BIOPSY TEMPORAL ARTERY;  Surgeon: Vickie Epley, MD;  Location: [facility];  Service: General;  Laterality: Right;   CARDIAC CATHETERIZATION Left 07/26/2015   Procedure: Left Heart Cath and Coronary Angiography;  Surgeon: Dionisio David, MD;  Location: [place] [facility];  Service: Cardiovascular;  Laterality: Left;   CATARACT EXTRACTION W/ INTRAOCULAR LENS IMPLANT Right    CATARACT EXTRACTION W/PHACO Left 03/10/2017   Procedure: CATARACT EXTRACTION PHACO AND INTRAOCULAR LENS PLACEMENT ([place]);  Surgeon: Birder Robson, MD;  Location: [facility];  Service: Ophthalmology;  Laterality: Left;  [place] 00:51.9 AP% 14.2 CDE 7.39 fluid pack lot # [id number]   CHOLECYSTECTOMY  COLONOSCOPY WITH PROPOFOL N/A 08/12/2016   Procedure: COLONOSCOPY WITH PROPOFOL;  Surgeon: Lollie Sails, MD;  Location[facility];  Service: Endoscopy;  Laterality: N/A;   DIALYSIS FISTULA CREATION Left    upper arm   dialysis grafts     ESOPHAGOGASTRODUODENOSCOPY N/A 03/08/2015   Procedure: ESOPHAGOGASTRODUODENOSCOPY (EGD);  Surgeon: Manya Silvas, MD;  Location: [facility] ENDOSCOPY;  Service: Endoscopy;  Laterality: N/A;   ESOPHAGOGASTRODUODENOSCOPY (EGD) WITH PROPOFOL N/A 03/18/2016   Procedure: ESOPHAGOGASTRODUODENOSCOPY (EGD) WITH PROPOFOL;  Surgeon: [name], MD;  Location: [facility] ENDOSCOPY;  Service: Endoscopy;  Laterality: N/A;   EYE SURGERY Right 2018   FECAL TRANSPLANT N/A 08/23/2015   Procedure: FECAL TRANSPLANT;  Surgeon: Manya Silvas, MD;  Location: [facility] ENDOSCOPY;  Service: Endoscopy;  Laterality: N/A;   HAND SURGERY Bilateral    IR RADIOLOGIST EVAL & MGMT  07/28/2019   IR  RADIOLOGIST EVAL & MGMT  08/11/2019   PERIPHERAL VASCULAR CATHETERIZATION N/A 12/20/2015   Procedure: Thrombectomy of dialysis access versus permcath placement;  Surgeon: Algernon Huxley, MD;  Location: [place] [facility];  Service: Cardiovascular;  Laterality: N/A;   PERIPHERAL VASCULAR CATHETERIZATION N/A 12/20/2015   Procedure: A/V Shunt Intervention;  Surgeon: Algernon Huxley, MD;  Location: [place] [facility];  Service: Cardiovascular;  Laterality: N/A;   PERIPHERAL VASCULAR CATHETERIZATION N/A 12/20/2015   Procedure: A/V Shuntogram/Fistulagram;  Surgeon: Algernon Huxley, MD;  Location: [place] [facility];  Service: Cardiovascular;  Laterality: N/A;   PERIPHERAL VASCULAR CATHETERIZATION N/A 01/02/2016   Procedure: A/V Shuntogram/Fistulagram;  Surgeon: Algernon Huxley, MD;  Location: [place] [facility];  Service: Cardiovascular;  Laterality: N/A;   PERIPHERAL VASCULAR CATHETERIZATION N/A 01/02/2016   Procedure: A/V Shunt Intervention;  Surgeon: Algernon Huxley, MD;  Location: [place] [facility];  Service: Cardiovascular;  Laterality: N/A;    Social History   Socioeconomic History   Marital status: Divorced    Spouse name: Not on file   Number of children: Not on file   Years of education: Not on file   Highest education level: Not on file  Occupational History   Not on file  Tobacco Use   Smoking status: Former Smoker    Packs/day: 0.50    Years: 40.00    Pack years: 20.00    Types: Cigarettes    Quit date: 02/13/2015    Years since quitting: 4.6   Smokeless tobacco: Never Used  Substance and Sexual Activity   Alcohol use: Not Currently    Comment: glass wine week per pt   Drug use: Yes    Types: Marijuana    Comment: once a day   Sexual activity: Never  Other Topics Concern   Not on file  Social History Narrative   Not on file   Social Determinants of Health   Financial Resource Strain:    Difficulty of Paying Living Expenses: Not on file  Food Insecurity:     Worried About Charity fundraiser in the Last Year: Not on file   [employer] of Food in the Last Year: Not on file  Transportation Needs:    Lack of Transportation (Medical): Not on file   Lack of Transportation (Non-Medical): Not on file  Physical Activity:    Days of Exercise per Week: Not on file   Minutes of Exercise per Session: Not on file  Stress:    Feeling of Stress : Not on file  Social Connections:    Frequency of Communication  with Friends and Family: Not on file   Frequency of Social Gatherings with Friends and Family: Not on file   Attends Religious Services: Not on file   Active Member of Clubs or Organizations: Not on file   Attends Archivist Meetings: Not on file   Marital Status: Not on file  Intimate Partner Violence:    Fear of Current or Ex-Partner: Not on file   Emotionally Abused: Not on file   Physically Abused: Not on file   Sexually Abused: Not on file    Family History  Problem Relation Age of Onset   Kidney disease Mother    Diabetes Mother    Cancer Father    Kidney disease Sister      Current Outpatient Medications:    albuterol (PROVENTIL HFA;VENTOLIN HFA) 108 (90 Base) MCG/ACT inhaler, Inhale 2 puffs into the lungs every 6 (six) hours as needed for wheezing or shortness of breath., Disp: 1 Inhaler, Rfl: 2   albuterol (PROVENTIL) (2.5 MG/3ML) 0.083% nebulizer solution, Take 2.5 mg by nebulization every 4 (four) hours as needed for wheezing or shortness of breath., Disp: , Rfl:    amLODipine (NORVASC) 10 MG tablet, Take 10 mg by mouth daily., Disp: , Rfl:    aspirin EC 81 MG tablet, Take 81 mg by mouth daily., Disp: , Rfl:    atorvastatin (LIPITOR) 20 MG tablet, Take 20 mg by mouth every evening. , Disp: , Rfl:    carvedilol (COREG) 3.125 MG tablet, Take 1 tablet (3.125 mg total) by mouth 2 (two) times daily with a meal., Disp: 30 tablet, Rfl: 0   chlorpheniramine-HYDROcodone (TUSSIONEX) 10-8 MG/5ML SUER, Take 5  mLs by mouth every 12 (twelve) hours., Disp: 70 mL, Rfl: 0   cholecalciferol (VITAMIN D) 400 units TABS tablet, Take 400 Units by mouth daily., Disp: , Rfl:    clopidogrel (PLAVIX) 75 MG tablet, Take 75 mg by mouth daily., Disp: , Rfl:    cyanocobalamin 1000 MCG tablet, Take 1,000 mcg by mouth daily. , Disp: , Rfl:    cyclobenzaprine (FLEXERIL) 10 MG tablet, Take 1 tablet (10 mg total) by mouth 3 (three) times daily as needed., Disp: 15 tablet, Rfl: 0   diclofenac sodium (VOLTAREN) 1 % GEL, Apply 1 application topically daily as needed (for rash)., Disp: , Rfl:    ferrous sulfate ([employer] IRON) 325 (65 FE) MG tablet, Take 325 mg by mouth daily with breakfast., Disp: , Rfl:    FLUoxetine (PROZAC) 20 MG capsule, Take 1 capsule (20 mg total) by mouth daily. (Patient taking differently: Take 20 mg by mouth at bedtime. ), Disp: 30 capsule, Rfl: 0   furosemide (LASIX) 80 MG tablet, Take 80 mg by mouth daily., Disp: , Rfl:    hydrocortisone cream 0.5 %, Apply topically 3 (three) times daily., Disp: 30 g, Rfl: 0   insulin aspart (NOVOLOG FLEXPEN) 100 UNIT/ML FlexPen, Inject 4-15 Units into the skin 4 (four) times daily as needed for high blood sugar (> 100, per sliding scale)., Disp: , Rfl:    lipase/protease/amylase (CREON) 12000 UNITS CPEP capsule, Take 36000 mg in the morning, 24000 midday & 36000 in the evening, Disp: , Rfl:    LORazepam (ATIVAN PO), Take 1 Dose by mouth as needed. , Disp: , Rfl:    multivitamin (RENA-VIT) TABS tablet, Take 1 tablet by mouth daily. , Disp: , Rfl:    nitroGLYCERIN (NITROSTAT) 0.4 MG SL tablet, Place 0.4 mg under the tongue every 5 (five) minutes as  needed for chest pain. , Disp: , Rfl:    sevelamer carbonate (RENVELA) 800 MG tablet, Take 800-2,400 mg by mouth See admin instructions. Take 2400 mg by mouth 3 times daily with meals and take 800 mg by mouth with snacks, Disp: , Rfl:   Physical exam:  Vitals:   10/11/19 0956  BP: (!) 144/53  Pulse: 64  Resp:  18  Temp: 98 F (36.7 C)  TempSrc: Tympanic  SpO2: 97%  Weight: 196 lb (88.9 kg)   Physical Exam Constitutional:      Comments: Sitting in a wheelchair.  Appears fatigued  HENT:     Head: Normocephalic and atraumatic.  Eyes:     Pupils: Pupils are equal, round, and reactive to light.  Cardiovascular:     Rate and Rhythm: Normal rate and regular rhythm.     Heart sounds: Normal heart sounds.  Pulmonary:     Effort: Pulmonary effort is normal.     Breath sounds: Normal breath sounds.  Abdominal:     General: Bowel sounds are normal.     Palpations: Abdomen is soft.  Musculoskeletal:     Cervical back: Normal range of motion.  Skin:    General: Skin is warm and dry.  Neurological:     Mental Status: She is alert and oriented to person, place, and time.      CMP Latest Ref Rng & Units 10/01/2019  Glucose 70 - 99 mg/dL 201(H)  BUN 6 - 20 mg/dL 21(H)  Creatinine 0.44 - 1.00 mg/dL 7.37(H)  Sodium 135 - 145 mmol/L 131(L)  Potassium 3.5 - 5.1 mmol/L 4.3  Chloride 98 - 111 mmol/L 95(L)  CO2 22 - 32 mmol/L 24  Calcium 8.9 - 10.3 mg/dL 7.9(L)  Total Protein 6.5 - 8.1 g/dL -  Total Bilirubin 0.3 - 1.2 mg/dL -  Alkaline Phos 38 - 126 U/L -  AST 15 - 41 U/L -  ALT 0 - 44 U/L -   CBC Latest Ref Rng & Units 09/30/2019  WBC 4.0 - 10.5 K/uL 5.1  Hemoglobin 12.0 - 15.0 g/dL 9.1(L)  Hematocrit 36.0 - 46.0 % 28.4(L)  Platelets 150 - 400 K/uL 174    No images are attached to the encounter.  DG Chest 2 View  Result Date: 09/30/2019 CLINICAL DATA:  Chest pain and shortness of breath.  Renal failure EXAM: CHEST - 2 VIEW COMPARISON:  October 09, 2018 FINDINGS: There is ill-defined airspace opacity in the lateral right base. No other airspace opacity evident. There remains interstitial prominence with questionable interstitial edema. There is cardiomegaly with mild pulmonary venous hypertension. No adenopathy. Extensive stent from the left subclavian to the left brachial region noted. No  bone lesions. No pneumothorax. IMPRESSION: Suspect small focus of pneumonia in the lateral right base. No other airspace opacity evident. Stable cardiomegaly with a degree of pulmonary vascular congestion. Interstitial prominence may represent mild chronic interstitial edema. This appearance is stable from 1 year prior. Extensive stent placement on the left. Electronically Signed   By: Lowella Grip III M.D.   On: 09/30/2019 12:19   ABI  Result Date: 10/13/2019 LOWER EXTREMITY DOPPLER STUDY Indications: Rest pain. High Risk Factors: Hypertension.  Comparison Study: 04/28/2019 Performing Technologist: Almira Coaster RVS  Examination Guidelines: A complete evaluation includes at minimum, Doppler waveform signals and systolic blood pressure reading at the level of bilateral brachial, anterior tibial, and posterior tibial arteries, when vessel segments are accessible. Bilateral testing is considered an integral part of a complete  examination. Photoelectric Plethysmograph (PPG) waveforms and toe systolic pressure readings are included as required and additional duplex testing as needed. Limited examinations for reoccurring indications may be performed as noted.  ABI Findings: +---------+------------------+-----+---------+--------+  Right     Rt Pressure (mmHg) Index Waveform  Comment   +---------+------------------+-----+---------+--------+  Brachial  161                                          +---------+------------------+-----+---------+--------+  ATA                                triphasic           +---------+------------------+-----+---------+--------+  PTA                                triphasic           +---------+------------------+-----+---------+--------+  Great Toe 159                0.99  Normal              +---------+------------------+-----+---------+--------+ +---------+------------------+-----+---------+-------+  Left      Lt Pressure (mmHg) Index Waveform  Comment   +---------+------------------+-----+---------+-------+  ATA                                triphasic          +---------+------------------+-----+---------+-------+  PTA                                biphasic           +---------+------------------+-----+---------+-------+  Great Toe 152                0.94  Normal             +---------+------------------+-----+---------+-------+ +-------+------------+-----------+------------+------------+  ABI/TBI Today's ABI  Today's TBI Previous ABI Previous TBI  +-------+------------+-----------+------------+------------+  Right   Not obtained .99         1.05         .85           +-------+------------+-----------+------------+------------+  Left    Not obtained .94         1.09         .76           +-------+------------+-----------+------------+------------+ Bilateral TBIs appear increased compared to prior study on 04/28/2019.  Summary: Right: Resting right ankle-brachial index is within normal range. No evidence of significant right lower extremity arterial disease. The right toe-brachial index is normal. Unable to obtained ABI's due to Patient's inability to withstand Pressure Cuff. Left: Resting left ankle-brachial index is within normal range. No evidence of significant left lower extremity arterial disease. The left toe-brachial index is normal. Unable to obtained ABI's due to Patient's inability to withstand Pressure Cuff.  *See table(s) above for measurements and observations.  Electronically signed by Hortencia Pilar MD on 10/13/2019 at 4:33:45 PM.    Final    VAS [place] (AVF, AVG)  Result Date: 09/29/2019 DIALYSIS ACCESS Reason for Exam: Routine follow up. Access Site: Left Upper Extremity. Access Type: Brachial-axillary AVG. History: 08/24/13: Left brachial-axillary AVG placement;  H/O AVG & outflow vein stents as well as DRIL;. Performing Technologist: Blondell Reveal RT, RDMS, RVT  Examination Guidelines: A complete evaluation  includes B-mode imaging, spectral Doppler, color Doppler, and power Doppler as needed of all accessible portions of each vessel. Unilateral testing is considered an integral part of a complete examination. Limited examinations for reoccurring indications may be performed as noted.  Findings:  +--------------------+----------+-----------------+--------+  AVG                  PSV (cm/s) Flow Vol (mL/min) Describe  +--------------------+----------+-----------------+--------+  Native artery inflow    232           1251                  +--------------------+----------+-----------------+--------+  Arterial anastomosis    459                                 +--------------------+----------+-----------------+--------+  Prox graft              307                        stent    +--------------------+----------+-----------------+--------+  Mid graft               281                        stent    +--------------------+----------+-----------------+--------+  Distal graft            225                                 +--------------------+----------+-----------------+--------+  Venous anastomosis      370                        stent    +--------------------+----------+-----------------+--------+  Venous outflow          142                        stent    +--------------------+----------+-----------------+--------+ Antegrade, biphasic flow in the left distal radial artery with velocities of 98cm/s at rest and 105cm/s during access compression. No significant velocity increase consistent with significant access steal. Patent DRIL graft noted.  Summary: Patent left brachial-axillary AVG with a normal Flow Volume and no significant stenosis.  *See table(s) above for measurements and observations.  Diagnosing physician: Hortencia Pilar MD Electronically signed by Hortencia Pilar MD on 09/29/2019 at 5:21:36 PM.   --------------------------------------------------------------------------------   Final    VAS US  CAROTID  Result Date: 09/29/2019 Carotid Arterial Duplex Study Indications:   Carotid artery disease. Other Factors: Current left arm AVG. Performing Technologist: Blondell Reveal RT, RDMS, RVT  Examination Guidelines: A complete evaluation includes B-mode imaging, spectral Doppler, color Doppler, and power Doppler as needed of all accessible portions of each vessel. Bilateral testing is considered an integral part of a complete examination. Limited examinations for reoccurring indications may be performed as noted.  Right Carotid Findings: +----------+--------+--------+--------+------------------+-----------------+             PSV cm/s EDV cm/s Stenosis Plaque Description Comments           +----------+--------+--------+--------+------------------+-----------------+  CCA Prox   81       0                                                       +----------+--------+--------+--------+------------------+-----------------+  CCA Mid    73       11                                                      +----------+--------+--------+--------+------------------+-----------------+  CCA Distal 51       10                heterogenous                          +----------+--------+--------+--------+------------------+-----------------+  ICA Prox   434      87       80-99%   heterogenous       ICA/CCA ratio=5.9  +----------+--------+--------+--------+------------------+-----------------+  ICA Mid    174      29                                                      +----------+--------+--------+--------+------------------+-----------------+  ICA Distal 98       16                                                      +----------+--------+--------+--------+------------------+-----------------+  ECA        127      0                 heterogenous                          +----------+--------+--------+--------+------------------+-----------------+ +----------+--------+-------+----------------+-------------------+             PSV cm/s EDV  cms Describe         Arm Pressure (mmHG)  +----------+--------+-------+----------------+-------------------+  Subclavian 128              Multiphasic, WNL                      +----------+--------+-------+----------------+-------------------+ +---------+--------+--+--------+---------+  Vertebral PSV cm/s 87 EDV cm/s Antegrade  +---------+--------+--+--------+---------+  Left Carotid Findings: +----------+--------+--------+--------+------------------+-----------------+             PSV cm/s EDV cm/s Stenosis Plaque Description Comments           +----------+--------+--------+--------+------------------+-----------------+  CCA Prox   87       10                                                      +----------+--------+--------+--------+------------------+-----------------+  CCA Mid    69       11                                                      +----------+--------+--------+--------+------------------+-----------------+  CCA Distal  55       11                                                      +----------+--------+--------+--------+------------------+-----------------+  ICA Prox   213      43       40-59%   heterogenous       ICA/CCA ratio=3.1  +----------+--------+--------+--------+------------------+-----------------+  ICA Mid    124      24                                                      +----------+--------+--------+--------+------------------+-----------------+  ICA Distal 71       18                                                      +----------+--------+--------+--------+------------------+-----------------+  ECA        103      0                                                       +----------+--------+--------+--------+------------------+-----------------+ +----------+--------+--------+-------------------------+-------------------+             PSV cm/s EDV cm/s Describe                  Arm Pressure (mmHG)  +----------+--------+--------+-------------------------+-------------------+   Subclavian 188               Monophasic, fistular flow                      +----------+--------+--------+-------------------------+-------------------+ +---------+--------+--+--------+---------+  Vertebral PSV cm/s 57 EDV cm/s Antegrade  +---------+--------+--+--------+---------+  Summary: Right Carotid: Velocities in the right ICA are consistent with a 80-99%                stenosis. Left Carotid: Velocities in the left ICA are consistent with a 40-59% stenosis.                No significant change noted when compared to the previous exam on               01/07/18. *See table(s) above for measurements and observations. Electronically signed by Hortencia Pilar MD on 09/29/2019 at 5:21:32 PM.    Final    VAS US AORTA/IVC/ILIACS  Result Date: 10/13/2019 ABDOMINAL AORTA STUDY Performing Technologist: Almira Coaster RVS  Examination Guidelines: A complete evaluation includes B-mode imaging, spectral Doppler, color Doppler, and power Doppler as needed of all accessible portions of each vessel. Bilateral testing is considered an integral part of a complete examination. Limited examinations for reoccurring indications may be performed as noted.  Abdominal Aorta Findings: +-------------+-------+----------+----------+--------+--------+--------+  Location      AP (cm) Trans (cm) PSV (cm/s) Waveform Thrombus Comments  +-------------+-------+----------+----------+--------+--------+--------+  Proximal      2.10    1.97  104        biphasic                    +-------------+-------+----------+----------+--------+--------+--------+  Mid           2.00    1.92       115        biphasic                    +-------------+-------+----------+----------+--------+--------+--------+  Distal        2.14    2.01       122        biphasic                    +-------------+-------+----------+----------+--------+--------+--------+  RT CIA Prox   1.2     1.2        124        biphasic                     +-------------+-------+----------+----------+--------+--------+--------+  RT CIA Distal 0.8     0.8        125        biphasic                    +-------------+-------+----------+----------+--------+--------+--------+  RT EIA Prox   0.8     1.0        104        biphasic                    +-------------+-------+----------+----------+--------+--------+--------+  RT EIA Distal 0.9     0.9        137        biphasic                    +-------------+-------+----------+----------+--------+--------+--------+  LT CIA Prox   1.1     1.2        148        biphasic                    +-------------+-------+----------+----------+--------+--------+--------+  LT CIA Distal 1.1     1.1        175        biphasic                    +-------------+-------+----------+----------+--------+--------+--------+  LT EIA Prox   0.8     0.9        131        biphasic                    +-------------+-------+----------+----------+--------+--------+--------+  LT EIA Distal 0.7     0.9        124        biphasic                    +-------------+-------+----------+----------+--------+--------+--------+  Summary: Abdominal Aorta: No evidence of an abdominal aortic aneurysm was visualized. The largest aortic measurement is 2.1 cm.  *See table(s) above for measurements and observations.  Electronically signed by Hortencia Pilar MD on 10/13/2019 at 4:33:52 PM.   Final      Assessment and plan- Patient is a 59 y.o. female with MGUS and anemia of chronic kidney disease here for routine follow-up  Despite repeated attempts we were unable to get a good blood draw from the patient and do any labs today.  She has  been receiving 7800 units of EPO through dialysis 3 times a week.  We will get in touch with nephrology to see if they are able to get her's labs including CBC, B12, folate, myeloma panel, serum free light chains, reticulocyte count at the time of dialysis.  She did have iron studies on 09/01/2019 which showed a ferritin of 977 and a  low TIBC consistent with anemia of chronic disease.  Previously patient's hemoglobin was close to 9 with epo.  We will see if equal doses can be increased further by nephrology.  If patient continues to have significant anemia despite increased doses of EPO, I will talk to the patient about a bone marrow biopsy   Visit Diagnosis 1. Anemia of chronic kidney failure, unspecified stage   2. MGUS (monoclonal gammopathy of unknown significance)      Dr. Randa Evens, MD, MPH [facility] at [facility] [phone] 10/24/2019 8:18 AM

## 2019-10-25 ENCOUNTER — Other Ambulatory Visit: Payer: Self-pay

## 2019-10-25 ENCOUNTER — Ambulatory Visit: Admission: RE | Admit: 2019-10-25 | Payer: Medicare Other | Source: Ambulatory Visit

## 2019-10-27 ENCOUNTER — Ambulatory Visit (INDEPENDENT_AMBULATORY_CARE_PROVIDER_SITE_OTHER): Payer: Medicare Other | Admitting: Vascular Surgery

## 2019-10-28 ENCOUNTER — Other Ambulatory Visit
Admission: RE | Admit: 2019-10-28 | Discharge: 2019-10-28 | Disposition: A | Discharge status: Home / Self Care | Payer: Medicare Other | Source: Ambulatory Visit | Attending: Nephrology | Admitting: Nephrology

## 2019-10-28 DIAGNOSIS — N189 Chronic kidney disease, unspecified: Secondary | ICD-10-CM | POA: Diagnosis present

## 2019-10-28 DIAGNOSIS — D631 Anemia in chronic kidney disease: Secondary | ICD-10-CM | POA: Insufficient documentation

## 2019-10-28 LAB — CBC
HCT: 27.4 % — ABNORMAL LOW (ref 36.0–46.0)
Hemoglobin: 8.8 g/dL — ABNORMAL LOW (ref 12.0–15.0)
MCH: 28.1 pg (ref 26.0–34.0)
MCHC: 32.1 g/dL (ref 30.0–36.0)
MCV: 87.5 fL (ref 80.0–100.0)
Platelets: 239 10*3/uL (ref 150–400)
RBC: 3.13 MIL/uL — ABNORMAL LOW (ref 3.87–5.11)
RDW: 16.9 % — ABNORMAL HIGH (ref 11.5–15.5)
WBC: 4.8 10*3/uL (ref 4.0–10.5)
nRBC: 0 % (ref 0.0–0.2)

## 2019-10-28 LAB — RETICULOCYTES
Immature Retic Fract: 27.7 % — ABNORMAL HIGH (ref 2.3–15.9)
RBC.: 3.14 MIL/uL — ABNORMAL LOW (ref 3.87–5.11)
Retic Count, Absolute: 98 10*3/uL (ref 19.0–186.0)
Retic Ct Pct: 3.1 % (ref 0.4–3.1)

## 2019-10-28 LAB — VITAMIN B12: Vitamin B-12: 1499 pg/mL — ABNORMAL HIGH (ref 180–914)

## 2019-10-28 LAB — FOLATE: Folate: 21.9 ng/mL (ref >5.9)

## 2019-10-31 LAB — PROTEIN ELECTROPHORESIS, SERUM
A/G Ratio: 1.3 (ref 0.7–1.7)
Albumin ELP: 3.5 g/dL (ref 2.9–4.4)
Alpha-1-Globulin: 0.2 g/dL (ref 0.0–0.4)
Alpha-2-Globulin: 0.5 g/dL (ref 0.4–1.0)
Beta Globulin: 0.8 g/dL (ref 0.7–1.3)
Gamma Globulin: 1.2 g/dL (ref 0.4–1.8)
Globulin, Total: 2.8 g/dL (ref 2.2–3.9)
Total Protein ELP: 6.3 g/dL (ref 6.0–8.5)

## 2019-10-31 LAB — KAPPA/LAMBDA LIGHT CHAINS
Kappa free light chain: 153.4 mg/L — ABNORMAL HIGH (ref 3.3–19.4)
Kappa, lambda light chain ratio: 0.8 (ref 0.26–1.65)
Lambda free light chains: 191 mg/L — ABNORMAL HIGH (ref 5.7–26.3)

## 2019-11-03 ENCOUNTER — Other Ambulatory Visit: Payer: Self-pay | Admitting: Vascular Surgery

## 2019-11-03 ENCOUNTER — Other Ambulatory Visit (INDEPENDENT_AMBULATORY_CARE_PROVIDER_SITE_OTHER): Payer: Self-pay | Admitting: Vascular Surgery

## 2019-11-03 ENCOUNTER — Other Ambulatory Visit: Payer: Self-pay

## 2019-11-03 ENCOUNTER — Ambulatory Visit
Admission: RE | Admit: 2019-11-03 | Discharge: 2019-11-03 | Disposition: A | Discharge status: Home / Self Care | Payer: Medicare Other | Source: Ambulatory Visit | Attending: Vascular Surgery | Admitting: Vascular Surgery

## 2019-11-03 DIAGNOSIS — I6523 Occlusion and stenosis of bilateral carotid arteries: Secondary | ICD-10-CM

## 2019-11-03 MED ORDER — IOHEXOL 350 MG/ML SOLN: 75.0000 mL | Freq: Once | INTRAVENOUS | Status: DC | PRN

## 2019-11-09 ENCOUNTER — Emergency Department
Admission: EM | Admit: 2019-11-09 | Discharge: 2019-11-09 | Disposition: A | Discharge status: Home / Self Care | Payer: Medicare Other | Attending: Emergency Medicine | Admitting: Emergency Medicine

## 2019-11-09 ENCOUNTER — Encounter: Payer: Self-pay | Admitting: Emergency Medicine

## 2019-11-09 ENCOUNTER — Other Ambulatory Visit: Payer: Self-pay

## 2019-11-09 ENCOUNTER — Emergency Department: Payer: Medicare Other

## 2019-11-09 DIAGNOSIS — I120 Hypertensive chronic kidney disease with stage 5 chronic kidney disease or end stage renal disease: Secondary | ICD-10-CM | POA: Diagnosis not present

## 2019-11-09 DIAGNOSIS — L02414 Cutaneous abscess of left upper limb: Secondary | ICD-10-CM

## 2019-11-09 DIAGNOSIS — Z79899 Other long term (current) drug therapy: Secondary | ICD-10-CM | POA: Insufficient documentation

## 2019-11-09 DIAGNOSIS — Y69 Unspecified misadventure during surgical and medical care: Secondary | ICD-10-CM | POA: Insufficient documentation

## 2019-11-09 DIAGNOSIS — I770 Arteriovenous fistula, acquired: Secondary | ICD-10-CM

## 2019-11-09 DIAGNOSIS — N186 End stage renal disease: Secondary | ICD-10-CM | POA: Diagnosis not present

## 2019-11-09 DIAGNOSIS — Z794 Long term (current) use of insulin: Secondary | ICD-10-CM | POA: Diagnosis not present

## 2019-11-09 DIAGNOSIS — E1122 Type 2 diabetes mellitus with diabetic chronic kidney disease: Secondary | ICD-10-CM | POA: Insufficient documentation

## 2019-11-09 DIAGNOSIS — Z992 Dependence on renal dialysis: Secondary | ICD-10-CM | POA: Insufficient documentation

## 2019-11-09 DIAGNOSIS — T148XXA Other injury of unspecified body region, initial encounter: Secondary | ICD-10-CM

## 2019-11-09 DIAGNOSIS — T829XXA Unspecified complication of cardiac and vascular prosthetic device, implant and graft, initial encounter: Secondary | ICD-10-CM | POA: Diagnosis not present

## 2019-11-09 DIAGNOSIS — M79602 Pain in left arm: Secondary | ICD-10-CM | POA: Insufficient documentation

## 2019-11-09 DIAGNOSIS — M25122 Fistula, left elbow: Secondary | ICD-10-CM

## 2019-11-09 DIAGNOSIS — M7989 Other specified soft tissue disorders: Secondary | ICD-10-CM

## 2019-11-09 LAB — CBC WITH DIFFERENTIAL/PLATELET
Abs Immature Granulocytes: 0.05 10*3/uL (ref 0.00–0.07)
Basophils Absolute: 0 10*3/uL (ref 0.0–0.1)
Basophils Relative: 1 %
Eosinophils Absolute: 0.4 10*3/uL (ref 0.0–0.5)
Eosinophils Relative: 8 %
HCT: 31.2 % — ABNORMAL LOW (ref 36.0–46.0)
Hemoglobin: 10.1 g/dL — ABNORMAL LOW (ref 12.0–15.0)
Immature Granulocytes: 1 %
Lymphocytes Relative: 32 %
Lymphs Abs: 1.7 10*3/uL (ref 0.7–4.0)
MCH: 27.5 pg (ref 26.0–34.0)
MCHC: 32.4 g/dL (ref 30.0–36.0)
MCV: 85 fL (ref 80.0–100.0)
Monocytes Absolute: 0.5 10*3/uL (ref 0.1–1.0)
Monocytes Relative: 10 %
Neutro Abs: 2.6 10*3/uL (ref 1.7–7.7)
Neutrophils Relative %: 48 %
Platelets: 199 10*3/uL (ref 150–400)
RBC: 3.67 MIL/uL — ABNORMAL LOW (ref 3.87–5.11)
RDW: 17.2 % — ABNORMAL HIGH (ref 11.5–15.5)
WBC: 5.3 10*3/uL (ref 4.0–10.5)
nRBC: 0 % (ref 0.0–0.2)

## 2019-11-09 LAB — BASIC METABOLIC PANEL
Anion gap: 13 (ref 5–15)
BUN: 18 mg/dL (ref 6–20)
CO2: 23 mmol/L (ref 22–32)
Calcium: 8.4 mg/dL — ABNORMAL LOW (ref 8.9–10.3)
Chloride: 95 mmol/L — ABNORMAL LOW (ref 98–111)
Creatinine, Ser: 5.75 mg/dL — ABNORMAL HIGH (ref 0.44–1.00)
GFR calc Af Amer: 9 mL/min — ABNORMAL LOW (ref >60)
GFR calc non Af Amer: 7 mL/min — ABNORMAL LOW (ref >60)
Glucose, Bld: 95 mg/dL (ref 70–99)
Potassium: 4.4 mmol/L (ref 3.5–5.1)
Sodium: 131 mmol/L — ABNORMAL LOW (ref 135–145)

## 2019-11-09 MED ORDER — MUPIROCIN 2 % EX OINT: TOPICAL_OINTMENT | CUTANEOUS | 0 refills | Status: DC

## 2019-11-09 MED ORDER — DOXYCYCLINE HYCLATE 100 MG PO CAPS: 100.0000 mg | ORAL_CAPSULE | Freq: Two times a day (BID) | ORAL | 0 refills | Status: DC

## 2019-11-09 MED ORDER — DOXYCYCLINE HYCLATE 100 MG PO CAPS: 100.0000 mg | ORAL_CAPSULE | Freq: Two times a day (BID) | ORAL | 0 refills | Status: AC

## 2019-11-09 MED ORDER — CEFTRIAXONE SODIUM 1 G IJ SOLR
1.0000 g | Freq: Once | INTRAMUSCULAR | Status: AC
  Administered 2019-11-09: 1 g via INTRAMUSCULAR
  Filled 2019-11-09: qty 10

## 2019-11-09 NOTE — ED Triage Notes (Signed)
Pt to ER via EMS from dialysis with c/o pus from her fistula.  Pt states it was noted on Monday, but they continued with her treatment.  Today continued pus noted and they did not do her treatment.  Pt states some pain on palpation.  Dr. Ellender Hose to room to assess pt.

## 2019-11-09 NOTE — ED Notes (Signed)
PT to US

## 2019-11-09 NOTE — ED Provider Notes (Signed)
[facility] Emergency Department Provider Note  ____________________________________________   First MD Initiated Contact with Patient 11/09/19 1137     (approximate)  I have reviewed the triage vital signs and the nursing notes.   HISTORY  Chief Complaint Vascular Access Problem    HPI [name] is a 59 y.o. female  With extensive PMHx below including ESRD here with left AV fistula pain. Pt reports that for the last week, she's had increasing pain and difficulty with accessing her left AV fistula.  She states the dialysis center has had to try multiple times and has been having a significant amount of bleeding and passage of clots when they remove the needle.  On Monday when she went, she had more pain, and reportedly had some purulence coming from the wound.  She is noticed some increasing redness where they have been accessing her catheter site.  Denies any fevers or chills.  No nausea.  No vomiting.  No other complaints.  No history of infection of previous AV fistulas.  Recurrent fistula was placed here with [facility].        Past Medical History:  Diagnosis Date  . Anemia   . Anginal pain ([place])   . Anxiety   . Arthritis   . Asthma   . Broken wrist   . Bronchitis   . chronic diastolic CHF 8/56/3149  . COPD (chronic obstructive pulmonary disease) ([place])   . Coronary artery disease    a. cath 2013: stenting to RCA (report not available); b. cath 2014: LM nl, pLAD 40%, mLAD nl, ost LCx 40%, mid LCx nl, pRCA 30% @ site of prior stent, mRCA 50%  . Depression   . Diabetes mellitus without complication ([place])   . Diabetic neuropathy ([place])   . dialysis 2006  . Diverticulosis   . Dizziness   . Dyspnea   . Elevated lipids   . Environmental and seasonal allergies   . ESRD (end stage renal disease) on dialysis ([place])    M-W-F  . GERD (gastroesophageal reflux disease)   . Headache   . History of anemia due to chronic kidney disease     . History of hiatal hernia   . HOH (hard of hearing)   . Hx of pancreatitis 2015  . Hypertension   . Lower extremity edema   . Mitral regurgitation    a. echo 10/2013: EF 62%, noWMA, mildly dilated LA, mild to mod MR/TR, GR1DD  . Myocardial infarction ([place])   . Orthopnea   . Pneumonia   . Renal cancer ([place])   . Renal insufficiency    Pt is on dialysis on M,W + F.  . Wheezing     Patient Active Problem List   Diagnosis Date Noted  . Leg pain 10/02/2019  . CAP (community acquired pneumonia) 10/01/2019  . Atherosclerosis of native arteries of extremity with intermittent claudication ([place]) 05/01/2019  . Right-sided headache   . COPD (chronic obstructive pulmonary disease) ([place]) 10/06/2018  . Syncope 10/04/2018  . Symptomatic anemia 06/18/2018  . Gastroparesis due to DM ([place]) 01/18/2018  . Complication of vascular access for dialysis 12/04/2017  . Osteomyelitis ([place]) 09/30/2017  . Carotid stenosis 06/18/2017  . Shortness of breath 05/04/2017  . Cellulitis of lower extremity 07/29/2016  . Chronic venous insufficiency 07/29/2016  . Lymphedema 07/29/2016  . TIA (transient ischemic attack) 04/21/2016  . Altered mental status 04/08/2016  . Hyperammonemia ([place]) 04/08/2016  . Elevated troponin 04/08/2016  .  Depression 04/08/2016  . Depression, major, recurrent, severe with psychosis ([place]) 04/08/2016  . Blood in stool   . Intractable cyclical vomiting with nausea   . Reflux esophagitis   . Gastritis   . Generalized abdominal pain   . Uncontrollable vomiting   . Major depressive disorder, recurrent episode, moderate ([place]) 03/15/2016  . Adjustment disorder with mixed anxiety and depressed mood 03/15/2016  . Malnutrition of moderate degree 12/01/2015  . Renal mass   . Dyspnea   . Acute renal failure ([place])   . Respiratory failure ([place])   . High temperature 11/14/2015  . Pulmonary edema   . Encounter for central line placement   . Encounter for orogastric (OG) tube placement    . Nausea 11/12/2015  . Hyperkalemia 10/03/2015  . Diarrhea, unspecified 07/22/2015  . Pneumonia 05/21/2015  . Hypoglycemia 04/24/2015  . Unresponsiveness 04/24/2015  . Bradycardia 04/24/2015  . Hypothermia 04/24/2015  . Acute respiratory failure ([place]) 04/24/2015  . Acute diastolic CHF (congestive heart failure) ([place]) 04/05/2015  . Diabetic gastroparesis ([place]) 04/05/2015  . Hypokalemia 04/05/2015  . Generalized weakness 04/05/2015  . Acute pulmonary edema ([place]) 04/03/2015  . Nausea and vomiting 04/03/2015  . Hypoglycemia associated with diabetes ([place]) 04/03/2015  . Anemia of chronic disease 04/03/2015  . Secondary hyperparathyroidism ([place]) 04/03/2015  . Pressure ulcer 04/02/2015  . Acute respiratory failure with hypoxia ([place]) 04/01/2015  . Adjustment disorder with anxiety 03/14/2015  . Somatic symptom disorder, mild 03/08/2015  . Coronary artery disease involving native coronary artery of native heart without angina pectoris   . Nausea & vomiting 03/06/2015  . Abdominal pain 03/06/2015  . DM (diabetes mellitus) ([place]) 03/06/2015  . HTN (hypertension) 03/06/2015  . Gastroparesis 02/24/2015  . Pleural effusion 02/19/2015  . HCAP (healthcare-associated pneumonia) 02/19/2015  . End-stage renal disease on hemodialysis ([place]) 02/19/2015    Past Surgical History:  Procedure Laterality Date  . A/V SHUNTOGRAM Left 01/20/2018   Procedure: A/V SHUNTOGRAM;  Surgeon: Algernon Huxley, MD;  Location: [place] [facility];  Service: Cardiovascular;  Laterality: Left;  . ABDOMINAL HYSTERECTOMY  1992  . AMPUTATION TOE Left 10/02/2017   Procedure: AMPUTATION TOE-LEFT GREAT TOE;  Surgeon: Albertine Patricia, DPM;  Location: [facility] ORS;  Service: Podiatry;  Laterality: Left;  . APPENDECTOMY    . ARTERY BIOPSY Right 10/11/2018   Procedure: BIOPSY TEMPORAL ARTERY;  Surgeon: Vickie Epley, MD;  Location: [facility];  Service: General;  Laterality: Right;  . CARDIAC CATHETERIZATION Left 07/26/2015    Procedure: Left Heart Cath and Coronary Angiography;  Surgeon: Dionisio David, MD;  Location: [place] [facility];  Service: Cardiovascular;  Laterality: Left;  . CATARACT EXTRACTION W/ INTRAOCULAR LENS IMPLANT Right   . CATARACT EXTRACTION W/PHACO Left 03/10/2017   Procedure: CATARACT EXTRACTION PHACO AND INTRAOCULAR LENS PLACEMENT (IOC);  Surgeon: Birder Robson, MD;  Location: [facility];  Service: Ophthalmology;  Laterality: Left;  [place] 00:51.9 AP% 14.2 CDE 7.39 fluid pack lot # [id number]  . CHOLECYSTECTOMY    . COLONOSCOPY WITH PROPOFOL N/A 08/12/2016   Procedure: COLONOSCOPY WITH PROPOFOL;  Surgeon: Lollie Sails, MD;  Location: [facility];  Service: Endoscopy;  Laterality: N/A;  . DIALYSIS FISTULA CREATION Left    upper arm  . dialysis grafts    . ESOPHAGOGASTRODUODENOSCOPY N/A 03/08/2015   Procedure: ESOPHAGOGASTRODUODENOSCOPY (EGD);  Surgeon: Manya Silvas, MD;  Location: [facility] ENDOSCOPY;  Service: Endoscopy;  Laterality: N/A;  . ESOPHAGOGASTRODUODENOSCOPY (EGD) WITH PROPOFOL N/A 03/18/2016   Procedure: ESOPHAGOGASTRODUODENOSCOPY (EGD) WITH  PROPOFOL;  Surgeon: [name], MD;  Location[facility] END[facility]COPY;  Service: Endoscopy;  Laterality: N/A;  . EYE SURGERY Right 2018  . FECAL TRANSPLANT N/A 08/23/2015   Procedure: FECAL TRANSPLANT;  Surgeon: Manya Silvas, MD;  Location: [facility] ENDOSCOPY;  Service: Endoscopy;  Laterality: N/A;  . HAND SURGERY Bilateral   . IR RADIOLOGIST EVAL & MGMT  07/28/2019  . IR RADIOLOGIST EVAL & MGMT  08/11/2019  . PERIPHERAL VASCULAR CATHETERIZATION N/A 12/20/2015   Procedure: Thrombectomy of dialysis access versus permcath placement;  Surgeon: Algernon Huxley, MD;  Location: [place] [facility];  Service: Cardiovascular;  Laterality: N/A;  . PERIPHERAL VASCULAR CATHETERIZATION N/A 12/20/2015   Procedure: A/V Shunt Intervention;  Surgeon: Algernon Huxley, MD;  Location: [place] [facility];  Service: Cardiovascular;  Laterality: N/A;  . PERIPHERAL VASCULAR  CATHETERIZATION N/A 12/20/2015   Procedure: A/V Shuntogram/Fistulagram;  Surgeon: Algernon Huxley, MD;  Location: [place] [facility];  Service: Cardiovascular;  Laterality: N/A;  . PERIPHERAL VASCULAR CATHETERIZATION N/A 01/02/2016   Procedure: A/V Shuntogram/Fistulagram;  Surgeon: Algernon Huxley, MD;  Location: [place] [facility];  Service: Cardiovascular;  Laterality: N/A;  . PERIPHERAL VASCULAR CATHETERIZATION N/A 01/02/2016   Procedure: A/V Shunt Intervention;  Surgeon: Algernon Huxley, MD;  Location: [place] [facility];  Service: Cardiovascular;  Laterality: N/A;    Prior to Admission medications   Medication Sig Start Date End Date Taking? Authorizing Provider  albuterol (PROVENTIL HFA;VENTOLIN HFA) 108 (90 Base) MCG/ACT inhaler Inhale 2 puffs into the lungs every 6 (six) hours as needed for wheezing or shortness of breath. 02/17/18   Merlyn Lot, MD  albuterol (PROVENTIL) (2.5 MG/3ML) 0.083% nebulizer solution Take 2.5 mg by nebulization every 4 (four) hours as needed for wheezing or shortness of breath.    [provider]  amLODipine (NORVASC) 10 MG tablet Take 10 mg by mouth daily.    [provider]  aspirin EC 81 MG tablet Take 81 mg by mouth daily. 09/08/16   [provider]  atorvastatin (LIPITOR) 20 MG tablet Take 20 mg by mouth every evening.  12/02/16   [provider]  carvedilol (COREG) 3.125 MG tablet Take 1 tablet (3.125 mg total) by mouth 2 (two) times daily with a meal. 10/11/18   Epifanio Lesches, MD  chlorpheniramine-HYDROcodone (TUSSIONEX) 10-8 MG/5ML SUER Take 5 mLs by mouth every 12 (twelve) hours. 10/11/18   Epifanio Lesches, MD  cholecalciferol (VITAMIN D) 400 units TABS tablet Take 400 Units by mouth daily.    [provider]  clopidogrel (PLAVIX) 75 MG tablet Take 75 mg by mouth daily. 07/01/19   [provider]  cyanocobalamin 1000 MCG tablet Take 1,000 mcg by mouth daily.     [provider]    cyclobenzaprine (FLEXERIL) 10 MG tablet Take 1 tablet (10 mg total) by mouth 3 (three) times daily as needed. 11/22/18   Sable Feil, PA-C  diclofenac sodium (VOLTAREN) 1 % GEL Apply 1 application topically daily as needed (for rash).    [provider]  doxycycline (VIBRAMYCIN) 100 MG capsule Take 1 capsule (100 mg total) by mouth 2 (two) times daily for 7 days. 11/09/19 11/16/19  Duffy Bruce, MD  ferrous sulfate ([facility] IRON) 325 (65 FE) MG tablet Take 325 mg by mouth daily with breakfast.    [provider]  FLUoxetine (PROZAC) 20 MG capsule Take 1 capsule (20 mg total) by mouth daily. Patient taking differently: Take 20 mg by mouth at  bedtime.  04/10/16   Hower, Aaron Mose, MD  furosemide (LASIX) 80 MG tablet Take 80 mg by mouth daily. 07/09/19   [provider]  hydrocortisone cream 0.5 % Apply topically 3 (three) times daily. 01/21/18   Wieting, Richard, MD  insulin aspart (NOVOLOG FLEXPEN) 100 UNIT/ML FlexPen Inject 4-15 Units into the skin 4 (four) times daily as needed for high blood sugar (> 100, per sliding scale).    [provider]  lipase/protease/amylase (CREON) 12000 UNITS CPEP capsule Take 36000 mg in the morning, 24000 midday & 36000 in the evening    [provider]  LORazepam (ATIVAN PO) Take 1 Dose by mouth as needed.     [provider]  multivitamin (RENA-VIT) TABS tablet Take 1 tablet by mouth daily.     [provider]  mupirocin ointment (BACTROBAN) 2 % Apply to affected area twice daily 11/09/19 11/08/20  Duffy Bruce, MD  nitroGLYCERIN (NITROSTAT) 0.4 MG SL tablet Place 0.4 mg under the tongue every 5 (five) minutes as needed for chest pain.     [provider]  sevelamer carbonate (RENVELA) 800 MG tablet Take 800-2,400 mg by mouth See admin instructions. Take 2400 mg by mouth 3 times daily with meals and take 800 mg by mouth with snacks    [provider]    Allergies Ace inhibitors, Ativan  [lorazepam], Compazine [prochlorperazine edisylate], Sumatriptan succinate, Dilaudid [hydromorphone hcl], Ondansetron, Zofran [ondansetron hcl], Codeine, Gabapentin, [place] bovis, Losartan, Oxycodone, Prochlorperazine, Reglan [metoclopramide], Scopolamine, Tape, and Tapentadol  Family History  Problem Relation Age of Onset  . Kidney disease Mother   . Diabetes Mother   . Cancer Father   . Kidney disease Sister     Social History Social History   Tobacco Use  . Smoking status: Former Smoker    Packs/day: 0.50    Years: 40.00    Pack years: 20.00    Types: Cigarettes    Quit date: 02/13/2015    Years since quitting: 4.7  . Smokeless tobacco: Never Used  Substance Use Topics  . Alcohol use: Not Currently    Comment: glass wine week per pt  . Drug use: Yes    Types: Marijuana    Comment: once a day    Review of Systems  Review of Systems  Constitutional: Negative for chills and fever.  HENT: Negative for sore throat.   Respiratory: Negative for shortness of breath.   Cardiovascular: Negative for chest pain.  Gastrointestinal: Negative for abdominal pain.  Genitourinary: Negative for flank pain.  Musculoskeletal: Positive for arthralgias. Negative for neck pain.  Skin: Positive for wound. Negative for rash.  Allergic/Immunologic: Negative for immunocompromised state.  Neurological: Negative for weakness and numbness.  Hematological: Does not bruise/bleed easily.  All other systems reviewed and are negative.    ____________________________________________  PHYSICAL EXAM:      VITAL SIGNS: ED Triage Vitals  Enc Vitals Group     BP 11/09/19 1139 (!) 167/63     Pulse Rate 11/09/19 1139 61     Resp 11/09/19 1139 18     Temp 11/09/19 1139 97.7 F (36.5 C)     Temp src --      SpO2 11/09/19 1139 100 %     Weight 11/09/19 1131 194 lb 3.6 oz (88.1 kg)     Height 11/09/19 1139 5\' 6"  (1.676 m)     Head Circumference --      Peak Flow --      Pain Score  11/09/19 1134 0      Pain Loc --      Pain Edu? --      Excl. in [place]? --      Physical Exam Vitals and nursing note reviewed.  Constitutional:      General: She is not in acute distress.    Appearance: She is well-developed.  HENT:     Head: Normocephalic and atraumatic.  Eyes:     Conjunctiva/sclera: Conjunctivae normal.  Cardiovascular:     Rate and Rhythm: Normal rate and regular rhythm.     Heart sounds: Normal heart sounds.  Pulmonary:     Effort: Pulmonary effort is normal. No respiratory distress.     Breath sounds: No wheezing.  Abdominal:     General: There is no distension.  Musculoskeletal:     Cervical back: Neck supple.  Skin:    General: Skin is warm.     Capillary Refill: Capillary refill takes less than 2 seconds.     Findings: No rash.  Neurological:     Mental Status: She is alert and oriented to person, place, and time.     Motor: No abnormal muscle tone.      UPPER EXTREMITY EXAM: Left  INSPECTION & PALPATION: Left upper extremity AV fistula noted.  There is palpable thrill throughout the fistula.  On the superior aspect of the lateral upper portion, there appears to be a small, punctate wound with mild surrounding skin redness and thickening.  No expressible purulence.  SENSORY: Sensation is intact to light touch in:  Superficial radial nerve distribution (dorsal first web space) Median nerve distribution (tip of index finger)   Ulnar nerve distribution (tip of small finger)     MOTOR:  + Motor posterior interosseous nerve (thumb IP extension) + Anterior interosseous nerve (thumb IP flexion, index finger DIP flexion) + Radial nerve (wrist extension) + Median nerve (palpable firing thenar mass) + Ulnar nerve (palpable firing of first dorsal interosseous muscle)  VASCULAR: 2+ radial pulse Brisk capillary refill < 2 sec, fingers warm and well-perfused      ____________________________________________   LABS (all labs ordered are listed, but only abnormal  results are displayed)  Labs Reviewed  BASIC METABOLIC PANEL - Abnormal; Notable for the following components:      Result Value   Sodium 131 (*)    Chloride 95 (*)    Creatinine, Ser 5.75 (*)    Calcium 8.4 (*)    GFR calc non Af Amer 7 (*)    GFR calc Af Amer 9 (*)    All other components within normal limits  CBC WITH DIFFERENTIAL/PLATELET - Abnormal; Notable for the following components:   RBC 3.67 (*)    Hemoglobin 10.1 (*)    HCT 31.2 (*)    RDW 17.2 (*)    All other components within normal limits  CBC WITH DIFFERENTIAL/PLATELET    ____________________________________________  EKG: None ________________________________________  RADIOLOGY All imaging, including plain films, CT scans, and ultrasounds, independently reviewed by me, and interpretations confirmed via formal radiology reads.  ED MD interpretation:   [place]: No acute abnormality  Official radiology report(s): [place] Dialysis Access  Result Date: 11/09/2019 CLINICAL DATA:  Status post prior left brachial artery to axillary vein dialysis graft placement. Apparent area clinical infection with purulent drainage at the level of the dialysis graft in the left upper arm. EXAM: ULTRASOUND DIALYSIS ACCESS TECHNIQUE: Sonographic evaluation of the dialysis graft and immediate surrounding soft tissues was performed. COMPARISON:  None. FINDINGS: Arterial anastomosis between the brachial artery and dialysis graft is normally patent. The graft itself demonstrates patent flow. There are some areas of intragraft irregularity at the level of a previously placed stent and irregularity at the venous anastomosis at the level of a previously placed stent. No focal abscess or significant perigraft fluid is demonstrated by ultrasound. IMPRESSION: Patent left upper arm straight dialysis graft between brachial artery and axillary vein including previously stented segments. No focal abscess or evidence of significant fluid around the graft by  ultrasound. Electronically Signed   By: Aletta Edouard M.D.   On: 11/09/2019 13:25    ____________________________________________  PROCEDURES   Procedure(s) performed (including Critical Care):  Procedures  ____________________________________________  INITIAL IMPRESSION / MDM / [place] / ED COURSE  As part of my medical decision making, I reviewed the following data within the [place] notes reviewed and incorporated, Old chart reviewed, Notes from prior ED visits, and [place] Controlled Substance Database       *LADEANA LAPLANT was evaluated in Emergency Department on 11/09/2019 for the symptoms described in the history of present illness. She was evaluated in the context of the global COVID-19 pandemic, which necessitated consideration that the patient might be at risk for infection with the SARS-CoV-2 virus that causes COVID-19. Institutional protocols and algorithms that pertain to the evaluation of patients at risk for COVID-19 are in a state of rapid change based on information released by regulatory bodies including the CDC and federal and state organizations. These policies and algorithms were followed during the patient's care in the ED.  Some ED evaluations and interventions may be delayed as a result of limited staffing during the pandemic.*     Medical Decision Making:  59 yo F with PMHx as above here for evaluation of possible wound infection overlying left upper arm AVF. Exam as above (see image). There is no significant warmth, erythema, or evidence suggest significant cellulitis, abscess, or deep infection. U/S obtained shows patent AVF with no secondary signs of infection. There is no edema clinically or on U/S. WBC is normal. She does have a punctate superficial wound from her multiple sticks, which I suspect has a mild skin reaction around it. Discussed case with Dr. Delana Meyer, will give Rocephin, start on empiric ABX to be safe, and have her  f/u in clinic. No indication for urgent intervention or admission.  ____________________________________________  FINAL CLINICAL IMPRESSION(S) / ED DIAGNOSES  Final diagnoses:  Wound of skin  A-V fistula (HCC)     MEDICATIONS GIVEN DURING THIS VISIT:  Medications  cefTRIAXone (ROCEPHIN) injection 1 g (1 g Intramuscular Given 11/09/19 1502)     ED Discharge Orders         Ordered    doxycycline (VIBRAMYCIN) 100 MG capsule  2 times daily,   Status:  Discontinued     11/09/19 1439    mupirocin ointment (BACTROBAN) 2 %  Status:  Discontinued     11/09/19 1439    doxycycline (VIBRAMYCIN) 100 MG capsule  2 times daily     11/09/19 1545    mupirocin ointment (BACTROBAN) 2 %     11/09/19 1545           Note:  This document was prepared using Dragon voice recognition software and may include unintentional dictation errors.   Duffy Bruce, MD 11/09/19 [phone]

## 2019-11-09 NOTE — Discharge Instructions (Addendum)
FOR THE OPEN AREA ON YOUR FISTULA: -Wash the area at least twice daily -After cleaning, apply antibiotic ointment over the wound  -Then, cover with a non-stick dressing  -Keep covered at all times  FOR DIALYSIS: -DO NOT access the fistula anywhere near the current wound/redness -Follow-up with Dr. Delana Meyer or your regular doc in the next week

## 2019-11-10 ENCOUNTER — Ambulatory Visit (INDEPENDENT_AMBULATORY_CARE_PROVIDER_SITE_OTHER): Payer: Medicare Other | Admitting: Vascular Surgery

## 2019-11-11 ENCOUNTER — Encounter: Payer: Self-pay | Admitting: Pulmonary Disease

## 2019-11-11 ENCOUNTER — Other Ambulatory Visit: Payer: Self-pay

## 2019-11-11 ENCOUNTER — Encounter
Admission: EM | Disposition: A | Discharge status: Skilled Nursing Facility | Payer: Self-pay | Source: Home / Self Care | Attending: Internal Medicine

## 2019-11-11 ENCOUNTER — Inpatient Hospital Stay (HOSPITAL_COMMUNITY)
Admit: 2019-11-11 | Discharge: 2019-11-11 | Disposition: A | Discharge status: Home / Self Care | Payer: Medicare Other | Attending: Pulmonary Disease | Admitting: Pulmonary Disease

## 2019-11-11 ENCOUNTER — Inpatient Hospital Stay: Payer: Medicare Other | Admitting: Anesthesiology

## 2019-11-11 ENCOUNTER — Inpatient Hospital Stay
Admission: EM | Admit: 2019-11-11 | Discharge: 2019-11-18 | DRG: 252 | Disposition: A | Discharge status: Skilled Nursing Facility | Payer: Medicare Other | Attending: Internal Medicine | Admitting: Internal Medicine

## 2019-11-11 ENCOUNTER — Encounter: Payer: Self-pay | Admitting: *Deleted

## 2019-11-11 ENCOUNTER — Emergency Department: Payer: Medicare Other

## 2019-11-11 ENCOUNTER — Inpatient Hospital Stay: Payer: Medicare Other

## 2019-11-11 DIAGNOSIS — I2510 Atherosclerotic heart disease of native coronary artery without angina pectoris: Secondary | ICD-10-CM | POA: Diagnosis present

## 2019-11-11 DIAGNOSIS — K449 Diaphragmatic hernia without obstruction or gangrene: Secondary | ICD-10-CM | POA: Diagnosis present

## 2019-11-11 DIAGNOSIS — H9190 Unspecified hearing loss, unspecified ear: Secondary | ICD-10-CM | POA: Diagnosis present

## 2019-11-11 DIAGNOSIS — Z87891 Personal history of nicotine dependence: Secondary | ICD-10-CM

## 2019-11-11 DIAGNOSIS — Z7902 Long term (current) use of antithrombotics/antiplatelets: Secondary | ICD-10-CM

## 2019-11-11 DIAGNOSIS — Z992 Dependence on renal dialysis: Secondary | ICD-10-CM

## 2019-11-11 DIAGNOSIS — E1142 Type 2 diabetes mellitus with diabetic polyneuropathy: Secondary | ICD-10-CM | POA: Diagnosis present

## 2019-11-11 DIAGNOSIS — T827XXA Infection and inflammatory reaction due to other cardiac and vascular devices, implants and grafts, initial encounter: Secondary | ICD-10-CM | POA: Diagnosis present

## 2019-11-11 DIAGNOSIS — T829XXD Unspecified complication of cardiac and vascular prosthetic device, implant and graft, subsequent encounter: Secondary | ICD-10-CM | POA: Diagnosis not present

## 2019-11-11 DIAGNOSIS — J449 Chronic obstructive pulmonary disease, unspecified: Secondary | ICD-10-CM | POA: Diagnosis present

## 2019-11-11 DIAGNOSIS — D631 Anemia in chronic kidney disease: Secondary | ICD-10-CM | POA: Diagnosis present

## 2019-11-11 DIAGNOSIS — T829XXA Unspecified complication of cardiac and vascular prosthetic device, implant and graft, initial encounter: Secondary | ICD-10-CM | POA: Diagnosis not present

## 2019-11-11 DIAGNOSIS — R0602 Shortness of breath: Secondary | ICD-10-CM | POA: Diagnosis not present

## 2019-11-11 DIAGNOSIS — T82838A Hemorrhage of vascular prosthetic devices, implants and grafts, initial encounter: Secondary | ICD-10-CM | POA: Diagnosis present

## 2019-11-11 DIAGNOSIS — J9601 Acute respiratory failure with hypoxia: Secondary | ICD-10-CM | POA: Diagnosis not present

## 2019-11-11 DIAGNOSIS — I081 Rheumatic disorders of both mitral and tricuspid valves: Secondary | ICD-10-CM | POA: Diagnosis present

## 2019-11-11 DIAGNOSIS — D649 Anemia, unspecified: Secondary | ICD-10-CM | POA: Diagnosis present

## 2019-11-11 DIAGNOSIS — Z9911 Dependence on respirator [ventilator] status: Secondary | ICD-10-CM

## 2019-11-11 DIAGNOSIS — Z9049 Acquired absence of other specified parts of digestive tract: Secondary | ICD-10-CM

## 2019-11-11 DIAGNOSIS — Z20822 Contact with and (suspected) exposure to covid-19: Secondary | ICD-10-CM | POA: Diagnosis present

## 2019-11-11 DIAGNOSIS — E1151 Type 2 diabetes mellitus with diabetic peripheral angiopathy without gangrene: Secondary | ICD-10-CM | POA: Diagnosis present

## 2019-11-11 DIAGNOSIS — Z885 Allergy status to narcotic agent status: Secondary | ICD-10-CM

## 2019-11-11 DIAGNOSIS — F1290 Cannabis use, unspecified, uncomplicated: Secondary | ICD-10-CM | POA: Diagnosis present

## 2019-11-11 DIAGNOSIS — F419 Anxiety disorder, unspecified: Secondary | ICD-10-CM | POA: Diagnosis present

## 2019-11-11 DIAGNOSIS — K861 Other chronic pancreatitis: Secondary | ICD-10-CM | POA: Diagnosis present

## 2019-11-11 DIAGNOSIS — E1122 Type 2 diabetes mellitus with diabetic chronic kidney disease: Secondary | ICD-10-CM | POA: Diagnosis present

## 2019-11-11 DIAGNOSIS — D62 Acute posthemorrhagic anemia: Secondary | ICD-10-CM | POA: Diagnosis present

## 2019-11-11 DIAGNOSIS — Z79899 Other long term (current) drug therapy: Secondary | ICD-10-CM

## 2019-11-11 DIAGNOSIS — I739 Peripheral vascular disease, unspecified: Secondary | ICD-10-CM | POA: Diagnosis present

## 2019-11-11 DIAGNOSIS — I6523 Occlusion and stenosis of bilateral carotid arteries: Secondary | ICD-10-CM | POA: Diagnosis present

## 2019-11-11 DIAGNOSIS — Z833 Family history of diabetes mellitus: Secondary | ICD-10-CM

## 2019-11-11 DIAGNOSIS — G8929 Other chronic pain: Secondary | ICD-10-CM | POA: Diagnosis present

## 2019-11-11 DIAGNOSIS — D696 Thrombocytopenia, unspecified: Secondary | ICD-10-CM | POA: Diagnosis not present

## 2019-11-11 DIAGNOSIS — N186 End stage renal disease: Secondary | ICD-10-CM

## 2019-11-11 DIAGNOSIS — L8992 Pressure ulcer of unspecified site, stage 2: Secondary | ICD-10-CM | POA: Insufficient documentation

## 2019-11-11 DIAGNOSIS — Z23 Encounter for immunization: Secondary | ICD-10-CM

## 2019-11-11 DIAGNOSIS — I132 Hypertensive heart and chronic kidney disease with heart failure and with stage 5 chronic kidney disease, or end stage renal disease: Secondary | ICD-10-CM | POA: Diagnosis present

## 2019-11-11 DIAGNOSIS — Z841 Family history of disorders of kidney and ureter: Secondary | ICD-10-CM

## 2019-11-11 DIAGNOSIS — N2581 Secondary hyperparathyroidism of renal origin: Secondary | ICD-10-CM | POA: Diagnosis present

## 2019-11-11 DIAGNOSIS — T82898A Other specified complication of vascular prosthetic devices, implants and grafts, initial encounter: Secondary | ICD-10-CM

## 2019-11-11 DIAGNOSIS — Z6835 Body mass index (BMI) 35.0-35.9, adult: Secondary | ICD-10-CM

## 2019-11-11 DIAGNOSIS — Z90710 Acquired absence of both cervix and uterus: Secondary | ICD-10-CM

## 2019-11-11 DIAGNOSIS — R001 Bradycardia, unspecified: Secondary | ICD-10-CM | POA: Diagnosis not present

## 2019-11-11 DIAGNOSIS — J9690 Respiratory failure, unspecified, unspecified whether with hypoxia or hypercapnia: Secondary | ICD-10-CM

## 2019-11-11 DIAGNOSIS — I5032 Chronic diastolic (congestive) heart failure: Secondary | ICD-10-CM | POA: Diagnosis present

## 2019-11-11 DIAGNOSIS — R578 Other shock: Secondary | ICD-10-CM

## 2019-11-11 DIAGNOSIS — I371 Nonrheumatic pulmonary valve insufficiency: Secondary | ICD-10-CM | POA: Diagnosis not present

## 2019-11-11 DIAGNOSIS — I272 Pulmonary hypertension, unspecified: Secondary | ICD-10-CM | POA: Diagnosis present

## 2019-11-11 DIAGNOSIS — F329 Major depressive disorder, single episode, unspecified: Secondary | ICD-10-CM | POA: Diagnosis present

## 2019-11-11 DIAGNOSIS — Y832 Surgical operation with anastomosis, bypass or graft as the cause of abnormal reaction of the patient, or of later complication, without mention of misadventure at the time of the procedure: Secondary | ICD-10-CM | POA: Diagnosis present

## 2019-11-11 DIAGNOSIS — L89151 Pressure ulcer of sacral region, stage 1: Secondary | ICD-10-CM | POA: Diagnosis not present

## 2019-11-11 DIAGNOSIS — I252 Old myocardial infarction: Secondary | ICD-10-CM

## 2019-11-11 DIAGNOSIS — K3184 Gastroparesis: Secondary | ICD-10-CM | POA: Diagnosis present

## 2019-11-11 DIAGNOSIS — Z7982 Long term (current) use of aspirin: Secondary | ICD-10-CM

## 2019-11-11 DIAGNOSIS — E785 Hyperlipidemia, unspecified: Secondary | ICD-10-CM | POA: Diagnosis present

## 2019-11-11 DIAGNOSIS — E1143 Type 2 diabetes mellitus with diabetic autonomic (poly)neuropathy: Secondary | ICD-10-CM | POA: Diagnosis present

## 2019-11-11 DIAGNOSIS — L8990 Pressure ulcer of unspecified site, unspecified stage: Secondary | ICD-10-CM | POA: Insufficient documentation

## 2019-11-11 DIAGNOSIS — Z955 Presence of coronary angioplasty implant and graft: Secondary | ICD-10-CM

## 2019-11-11 DIAGNOSIS — Z89422 Acquired absence of other left toe(s): Secondary | ICD-10-CM

## 2019-11-11 DIAGNOSIS — I361 Nonrheumatic tricuspid (valve) insufficiency: Secondary | ICD-10-CM | POA: Diagnosis not present

## 2019-11-11 DIAGNOSIS — Z888 Allergy status to other drugs, medicaments and biological substances status: Secondary | ICD-10-CM

## 2019-11-11 DIAGNOSIS — K219 Gastro-esophageal reflux disease without esophagitis: Secondary | ICD-10-CM | POA: Diagnosis present

## 2019-11-11 DIAGNOSIS — Z91048 Other nonmedicinal substance allergy status: Secondary | ICD-10-CM

## 2019-11-11 DIAGNOSIS — Z8673 Personal history of transient ischemic attack (TIA), and cerebral infarction without residual deficits: Secondary | ICD-10-CM

## 2019-11-11 HISTORY — PX: CENTRAL LINE INSERTION: CATH118232

## 2019-11-11 HISTORY — DX: Peripheral vascular disease, unspecified: I73.9

## 2019-11-11 HISTORY — DX: Gastroparesis: K31.84

## 2019-11-11 HISTORY — DX: Disorder of parathyroid gland, unspecified: E21.5

## 2019-11-11 HISTORY — PX: LIGATION OF ARTERIOVENOUS  FISTULA: SHX5948

## 2019-11-11 HISTORY — DX: Type 2 diabetes mellitus without complications: E11.9

## 2019-11-11 HISTORY — DX: Chronic kidney disease, unspecified: N18.9

## 2019-11-11 LAB — CBC WITH DIFFERENTIAL/PLATELET
Abs Immature Granulocytes: 0.03 10*3/uL (ref 0.00–0.07)
Abs Immature Granulocytes: 0.02 10*3/uL (ref 0.00–0.07)
Abs Immature Granulocytes: 0.03 10*3/uL (ref 0.00–0.07)
Abs Immature Granulocytes: 0.03 10*3/uL (ref 0.00–0.07)
Basophils Absolute: 0 10*3/uL (ref 0.0–0.1)
Basophils Absolute: 0 10*3/uL (ref 0.0–0.1)
Basophils Absolute: 0 10*3/uL (ref 0.0–0.1)
Basophils Absolute: 0 10*3/uL (ref 0.0–0.1)
Basophils Relative: 0 %
Basophils Relative: 1 %
Basophils Relative: 0 %
Basophils Relative: 0 %
Eosinophils Absolute: 0.4 10*3/uL (ref 0.0–0.5)
Eosinophils Absolute: 0.3 10*3/uL (ref 0.0–0.5)
Eosinophils Absolute: 0.2 10*3/uL (ref 0.0–0.5)
Eosinophils Absolute: 0.2 10*3/uL (ref 0.0–0.5)
Eosinophils Relative: 8 %
Eosinophils Relative: 4 %
Eosinophils Relative: 3 %
Eosinophils Relative: 4 %
HCT: 17.6 % — ABNORMAL LOW (ref 36.0–46.0)
HCT: 28.2 % — ABNORMAL LOW (ref 36.0–46.0)
HCT: 26.5 % — ABNORMAL LOW (ref 36.0–46.0)
HCT: 22 % — ABNORMAL LOW (ref 36.0–46.0)
Hemoglobin: 5.5 g/dL — ABNORMAL LOW (ref 12.0–15.0)
Hemoglobin: 9.3 g/dL — ABNORMAL LOW (ref 12.0–15.0)
Hemoglobin: 8.6 g/dL — ABNORMAL LOW (ref 12.0–15.0)
Hemoglobin: 7.5 g/dL — ABNORMAL LOW (ref 12.0–15.0)
Immature Granulocytes: 1 %
Immature Granulocytes: 0 %
Immature Granulocytes: 0 %
Immature Granulocytes: 1 %
Lymphocytes Relative: 38 %
Lymphocytes Relative: 22 %
Lymphocytes Relative: 15 %
Lymphocytes Relative: 17 %
Lymphs Abs: 1.8 10*3/uL (ref 0.7–4.0)
Lymphs Abs: 1.5 10*3/uL (ref 0.7–4.0)
Lymphs Abs: 1.3 10*3/uL (ref 0.7–4.0)
Lymphs Abs: 1 10*3/uL (ref 0.7–4.0)
MCH: 28.1 pg (ref 26.0–34.0)
MCH: 29 pg (ref 26.0–34.0)
MCH: 26.7 pg (ref 26.0–34.0)
MCH: 27.4 pg (ref 26.0–34.0)
MCHC: 31.3 g/dL (ref 30.0–36.0)
MCHC: 33 g/dL (ref 30.0–36.0)
MCHC: 32.5 g/dL (ref 30.0–36.0)
MCHC: 34.1 g/dL (ref 30.0–36.0)
MCV: 89.8 fL (ref 80.0–100.0)
MCV: 87.9 fL (ref 80.0–100.0)
MCV: 82.3 fL (ref 80.0–100.0)
MCV: 80.3 fL (ref 80.0–100.0)
Monocytes Absolute: 0.4 10*3/uL (ref 0.1–1.0)
Monocytes Absolute: 0.5 10*3/uL (ref 0.1–1.0)
Monocytes Absolute: 0.4 10*3/uL (ref 0.1–1.0)
Monocytes Absolute: 0.4 10*3/uL (ref 0.1–1.0)
Monocytes Relative: 9 %
Monocytes Relative: 8 %
Monocytes Relative: 5 %
Monocytes Relative: 7 %
Neutro Abs: 2 10*3/uL (ref 1.7–7.7)
Neutro Abs: 4.2 10*3/uL (ref 1.7–7.7)
Neutro Abs: 6.5 10*3/uL (ref 1.7–7.7)
Neutro Abs: 4.5 10*3/uL (ref 1.7–7.7)
Neutrophils Relative %: 44 %
Neutrophils Relative %: 65 %
Neutrophils Relative %: 77 %
Neutrophils Relative %: 71 %
Platelets: 161 10*3/uL (ref 150–400)
Platelets: 131 10*3/uL — ABNORMAL LOW (ref 150–400)
Platelets: 102 10*3/uL — ABNORMAL LOW (ref 150–400)
Platelets: 98 10*3/uL — ABNORMAL LOW (ref 150–400)
RBC: 1.96 MIL/uL — ABNORMAL LOW (ref 3.87–5.11)
RBC: 3.21 MIL/uL — ABNORMAL LOW (ref 3.87–5.11)
RBC: 3.22 MIL/uL — ABNORMAL LOW (ref 3.87–5.11)
RBC: 2.74 MIL/uL — ABNORMAL LOW (ref 3.87–5.11)
RDW: 17.4 % — ABNORMAL HIGH (ref 11.5–15.5)
RDW: 15.5 % (ref 11.5–15.5)
RDW: 17.6 % — ABNORMAL HIGH (ref 11.5–15.5)
RDW: 18.2 % — ABNORMAL HIGH (ref 11.5–15.5)
WBC: 4.6 10*3/uL (ref 4.0–10.5)
WBC: 6.6 10*3/uL (ref 4.0–10.5)
WBC: 8.4 10*3/uL (ref 4.0–10.5)
WBC: 6.2 10*3/uL (ref 4.0–10.5)
nRBC: 0 % (ref 0.0–0.2)
nRBC: 0 % (ref 0.0–0.2)
nRBC: 0 % (ref 0.0–0.2)
nRBC: 0 % (ref 0.0–0.2)

## 2019-11-11 LAB — GLUCOSE, CAPILLARY
Glucose-Capillary: 116 mg/dL — ABNORMAL HIGH (ref 70–99)
Glucose-Capillary: 106 mg/dL — ABNORMAL HIGH (ref 70–99)

## 2019-11-11 LAB — COMPREHENSIVE METABOLIC PANEL
ALT: 9 U/L (ref 0–44)
ALT: 9 U/L (ref 0–44)
AST: 13 U/L — ABNORMAL LOW (ref 15–41)
AST: 15 U/L (ref 15–41)
Albumin: 2.2 g/dL — ABNORMAL LOW (ref 3.5–5.0)
Albumin: 2.8 g/dL — ABNORMAL LOW (ref 3.5–5.0)
Alkaline Phosphatase: 49 U/L (ref 38–126)
Alkaline Phosphatase: 41 U/L (ref 38–126)
Anion gap: 12 (ref 5–15)
Anion gap: 10 (ref 5–15)
BUN: 27 mg/dL — ABNORMAL HIGH (ref 6–20)
BUN: 27 mg/dL — ABNORMAL HIGH (ref 6–20)
CO2: 23 mmol/L (ref 22–32)
CO2: 24 mmol/L (ref 22–32)
Calcium: 7 mg/dL — ABNORMAL LOW (ref 8.9–10.3)
Calcium: 6.8 mg/dL — ABNORMAL LOW (ref 8.9–10.3)
Chloride: 97 mmol/L — ABNORMAL LOW (ref 98–111)
Chloride: 98 mmol/L (ref 98–111)
Creatinine, Ser: 7.24 mg/dL — ABNORMAL HIGH (ref 0.44–1.00)
Creatinine, Ser: 6.89 mg/dL — ABNORMAL HIGH (ref 0.44–1.00)
GFR calc Af Amer: 6 mL/min — ABNORMAL LOW (ref >60)
GFR calc Af Amer: 7 mL/min — ABNORMAL LOW (ref >60)
GFR calc non Af Amer: 6 mL/min — ABNORMAL LOW (ref >60)
GFR calc non Af Amer: 6 mL/min — ABNORMAL LOW (ref >60)
Glucose, Bld: 151 mg/dL — ABNORMAL HIGH (ref 70–99)
Glucose, Bld: 200 mg/dL — ABNORMAL HIGH (ref 70–99)
Potassium: 4.2 mmol/L (ref 3.5–5.1)
Potassium: 4.1 mmol/L (ref 3.5–5.1)
Sodium: 132 mmol/L — ABNORMAL LOW (ref 135–145)
Sodium: 132 mmol/L — ABNORMAL LOW (ref 135–145)
Total Bilirubin: 0.8 mg/dL (ref 0.3–1.2)
Total Bilirubin: 1.1 mg/dL (ref 0.3–1.2)
Total Protein: 4.4 g/dL — ABNORMAL LOW (ref 6.5–8.1)
Total Protein: 4.7 g/dL — ABNORMAL LOW (ref 6.5–8.1)

## 2019-11-11 LAB — PREPARE RBC (CROSSMATCH)

## 2019-11-11 LAB — APTT
aPTT: 42 seconds — ABNORMAL HIGH (ref 24–36)
aPTT: 43 seconds — ABNORMAL HIGH (ref 24–36)

## 2019-11-11 LAB — ABO/RH: ABO/RH(D): O POS

## 2019-11-11 LAB — BLOOD GAS, ARTERIAL
Acid-base deficit: 0.6 mmol/L (ref 0.0–2.0)
Bicarbonate: 24.9 mmol/L (ref 20.0–28.0)
FIO2: 0.3
MECHVT: 450 mL
O2 Saturation: 96.7 %
PEEP: 5 cmH2O
Patient temperature: 37
RATE: 15 resp/min
pCO2 arterial: 43 mmHg (ref 32.0–48.0)
pH, Arterial: 7.37 (ref 7.350–7.450)
pO2, Arterial: 90 mmHg (ref 83.0–108.0)

## 2019-11-11 LAB — PROTIME-INR
INR: 1.4 — ABNORMAL HIGH (ref 0.8–1.2)
INR: 1.5 — ABNORMAL HIGH (ref 0.8–1.2)
Prothrombin Time: 17.2 seconds — ABNORMAL HIGH (ref 11.4–15.2)
Prothrombin Time: 18.1 seconds — ABNORMAL HIGH (ref 11.4–15.2)

## 2019-11-11 LAB — MRSA PCR SCREENING: MRSA by PCR: NEGATIVE

## 2019-11-11 LAB — RESPIRATORY PANEL BY RT PCR (FLU A&B, COVID)
Influenza A by PCR: NEGATIVE
Influenza B by PCR: NEGATIVE
SARS Coronavirus 2 by RT PCR: NEGATIVE

## 2019-11-11 LAB — MAGNESIUM: Magnesium: 2 mg/dL (ref 1.7–2.4)

## 2019-11-11 LAB — TROPONIN I (HIGH SENSITIVITY)
Troponin I (High Sensitivity): 6 ng/L (ref <18)
Troponin I (High Sensitivity): 6 ng/L (ref <18)

## 2019-11-11 LAB — FIBRINOGEN: Fibrinogen: 178 mg/dL — ABNORMAL LOW (ref 210–475)

## 2019-11-11 LAB — T4, FREE: Free T4: 0.98 ng/dL (ref 0.61–1.12)

## 2019-11-11 LAB — TSH: TSH: 1.333 u[IU]/mL (ref 0.350–4.500)

## 2019-11-11 SURGERY — LIGATION OF ARTERIOVENOUS  FISTULA
Anesthesia: General | Laterality: Right

## 2019-11-11 MED ORDER — CEFAZOLIN SODIUM-DEXTROSE 1-4 GM/50ML-% IV SOLN
1.0000 g | INTRAVENOUS | Status: AC
  Filled 2019-11-11 (×2): qty 50

## 2019-11-11 MED ORDER — SODIUM CHLORIDE 0.9 % IV SOLN: INTRAVENOUS | Status: DC | PRN

## 2019-11-11 MED ORDER — FENTANYL CITRATE (PF) 100 MCG/2ML IJ SOLN
25.0000 ug | INTRAMUSCULAR | Status: DC | PRN
  Administered 2019-11-11 – 2019-11-14 (×5): 25 ug via INTRAVENOUS
  Filled 2019-11-11 (×5): qty 2

## 2019-11-11 MED ORDER — LIDOCAINE HCL (CARDIAC) PF 100 MG/5ML IV SOSY
PREFILLED_SYRINGE | INTRAVENOUS | Status: DC | PRN
  Administered 2019-11-11: 100 mg via INTRAVENOUS

## 2019-11-11 MED ORDER — ACETAMINOPHEN 10 MG/ML IV SOLN
1000.0000 mg | Freq: Once | INTRAVENOUS | Status: AC
  Administered 2019-11-11: 1000 mg via INTRAVENOUS
  Filled 2019-11-11: qty 100

## 2019-11-11 MED ORDER — ONDANSETRON HCL 4 MG/2ML IJ SOLN
4.0000 mg | Freq: Four times a day (QID) | INTRAMUSCULAR | Status: DC | PRN
  Administered 2019-11-13 – 2019-11-16 (×2): 4 mg via INTRAVENOUS
  Filled 2019-11-11 (×2): qty 2

## 2019-11-11 MED ORDER — CHLORHEXIDINE GLUCONATE CLOTH 2 % EX PADS
6.0000 | MEDICATED_PAD | Freq: Every day | CUTANEOUS | Status: DC
  Administered 2019-11-12: 6 via TOPICAL

## 2019-11-11 MED ORDER — SUCCINYLCHOLINE CHLORIDE 20 MG/ML IJ SOLN
INTRAMUSCULAR | Status: DC | PRN
  Administered 2019-11-11: 100 mg via INTRAVENOUS

## 2019-11-11 MED ORDER — ALBUMIN HUMAN 5 % IV SOLN: INTRAVENOUS | Status: DC | PRN

## 2019-11-11 MED ORDER — GLYCOPYRROLATE 0.2 MG/ML IJ SOLN
INTRAMUSCULAR | Status: DC | PRN
  Administered 2019-11-11: .2 mg via INTRAVENOUS

## 2019-11-11 MED ORDER — ROCURONIUM BROMIDE 100 MG/10ML IV SOLN
INTRAVENOUS | Status: DC | PRN
  Administered 2019-11-11: 50 mg via INTRAVENOUS
  Administered 2019-11-11: 20 mg via INTRAVENOUS

## 2019-11-11 MED ORDER — EPINEPHRINE 1 MG/10ML IJ SOSY
PREFILLED_SYRINGE | INTRAMUSCULAR | Status: DC | PRN
  Administered 2019-11-11: .01 mg via INTRAVENOUS
  Administered 2019-11-11: .005 mg via INTRAVENOUS
  Administered 2019-11-11: .01 mg via INTRAVENOUS

## 2019-11-11 MED ORDER — PHENOL 1.4 % MT LIQD
1.0000 | OROMUCOSAL | Status: DC | PRN
  Administered 2019-11-11: 1 via OROMUCOSAL
  Filled 2019-11-11: qty 177

## 2019-11-11 MED ORDER — VANCOMYCIN HCL 1000 MG IV SOLR
INTRAVENOUS | Status: DC | PRN
  Administered 2019-11-11: 1000 mg via INTRAVENOUS

## 2019-11-11 MED ORDER — GLUCAGON HCL RDNA (DIAGNOSTIC) 1 MG IJ SOLR
3.0000 mg | Freq: Once | INTRAMUSCULAR | Status: AC
  Administered 2019-11-11: 3 mg via INTRAVENOUS
  Filled 2019-11-11 (×2): qty 3

## 2019-11-11 MED ORDER — PANTOPRAZOLE SODIUM 40 MG IV SOLR
40.0000 mg | Freq: Every day | INTRAVENOUS | Status: DC
  Administered 2019-11-11 – 2019-11-13 (×3): 40 mg via INTRAVENOUS
  Filled 2019-11-11 (×3): qty 40

## 2019-11-11 MED ORDER — FENTANYL CITRATE (PF) 100 MCG/2ML IJ SOLN
INTRAMUSCULAR | Status: AC
  Filled 2019-11-11: qty 2

## 2019-11-11 MED ORDER — ALBUMIN HUMAN 5 % IV SOLN
INTRAVENOUS | Status: AC
  Filled 2019-11-11: qty 250

## 2019-11-11 MED ORDER — ETOMIDATE 2 MG/ML IV SOLN
INTRAVENOUS | Status: DC | PRN
  Administered 2019-11-11: 16 mg via INTRAVENOUS

## 2019-11-11 MED ORDER — VANCOMYCIN HCL 1000 MG IV SOLR
INTRAVENOUS | Status: AC
  Filled 2019-11-11: qty 1000

## 2019-11-11 MED ORDER — INFLUENZA VAC SPLIT QUAD 0.5 ML IM SUSY: 0.5000 mL | PREFILLED_SYRINGE | INTRAMUSCULAR | Status: DC

## 2019-11-11 MED ORDER — IPRATROPIUM-ALBUTEROL 0.5-2.5 (3) MG/3ML IN SOLN: 3.0000 mL | Freq: Four times a day (QID) | RESPIRATORY_TRACT | Status: DC | PRN

## 2019-11-11 MED ORDER — MIDAZOLAM HCL 2 MG/2ML IJ SOLN
INTRAMUSCULAR | Status: AC
  Filled 2019-11-11: qty 2

## 2019-11-11 MED ORDER — CEFAZOLIN SODIUM-DEXTROSE 1-4 GM/50ML-% IV SOLN
INTRAVENOUS | Status: DC | PRN
  Administered 2019-11-11: 1 g via INTRAVENOUS

## 2019-11-11 MED ORDER — ATROPINE SULFATE 0.4 MG/ML IJ SOLN
INTRAMUSCULAR | Status: DC | PRN
  Administered 2019-11-11: .4 mg via INTRAVENOUS

## 2019-11-11 MED ORDER — MIDAZOLAM HCL 2 MG/2ML IJ SOLN
1.0000 mg | Freq: Once | INTRAMUSCULAR | Status: AC
  Administered 2019-11-11: 1 mg via INTRAVENOUS

## 2019-11-11 MED ORDER — IPRATROPIUM-ALBUTEROL 0.5-2.5 (3) MG/3ML IN SOLN
3.0000 mL | Freq: Four times a day (QID) | RESPIRATORY_TRACT | Status: DC
  Administered 2019-11-11: 3 mL via RESPIRATORY_TRACT
  Filled 2019-11-11: qty 3

## 2019-11-11 MED ORDER — SODIUM CHLORIDE 0.9% IV SOLUTION
Freq: Once | INTRAVENOUS | Status: AC
  Filled 2019-11-11: qty 250

## 2019-11-11 MED ORDER — PNEUMOCOCCAL VAC POLYVALENT 25 MCG/0.5ML IJ INJ: 0.5000 mL | INJECTION | INTRAMUSCULAR | Status: DC

## 2019-11-11 MED FILL — Medication: Qty: 1 | Status: AC

## 2019-11-11 SURGICAL SUPPLY — 64 items
BAG DECANTER FOR FLEXI CONT (MISCELLANEOUS) ×3 IMPLANT
BIOPATCH WHT 1IN DISK W/4.0 H (GAUZE/BANDAGES/DRESSINGS) ×3 IMPLANT
BLADE SURG SZ10 CARB STEEL (BLADE) ×3 IMPLANT
BLADE SURG SZ11 CARB STEEL (BLADE) ×3 IMPLANT
BOOT SUTURE AID YELLOW STND (SUTURE) IMPLANT
CANISTER SUCT 1200ML W/VALVE (MISCELLANEOUS) ×3 IMPLANT
CHLORAPREP W/TINT 26 (MISCELLANEOUS) ×12 IMPLANT
CLIP VESOCCLUDE MED 6/CT (CLIP) IMPLANT
CLIP VESOCCLUDE SM WIDE 6/CT (CLIP) IMPLANT
COVER PROBE FLX POLY STRL (MISCELLANEOUS) ×3 IMPLANT
COVER WAND RF STERILE (DRAPES) ×3 IMPLANT
DERMABOND ADVANCED (GAUZE/BANDAGES/DRESSINGS) ×1
DERMABOND ADVANCED .7 DNX12 (GAUZE/BANDAGES/DRESSINGS) ×2 IMPLANT
DRAPE 3/4 80X56 (DRAPES) ×6 IMPLANT
DRAPE IMP U-DRAPE 54X76 (DRAPES) ×3 IMPLANT
DRAPE WOUND VAC 10X15X1CM (MISCELLANEOUS) ×3 IMPLANT
DRESSING SURGICEL FIBRLLR 1X2 (HEMOSTASIS) ×4 IMPLANT
DRSG SURGICEL FIBRILLAR 1X2 (HEMOSTASIS) ×6
DRSG TEGADERM 4X4.75 (GAUZE/BANDAGES/DRESSINGS) ×3 IMPLANT
ELECT CAUTERY BLADE 6.4 (BLADE) ×6 IMPLANT
ELECT REM PT RETURN 9FT ADLT (ELECTROSURGICAL) ×3
ELECTRODE REM PT RTRN 9FT ADLT (ELECTROSURGICAL) ×2 IMPLANT
GLOVE SURG SYN 8.0 (GLOVE) ×3 IMPLANT
GOWN STRL REUS W/ TWL LRG LVL3 (GOWN DISPOSABLE) ×2 IMPLANT
GOWN STRL REUS W/ TWL XL LVL3 (GOWN DISPOSABLE) ×2 IMPLANT
GOWN STRL REUS W/TWL LRG LVL3 (GOWN DISPOSABLE) ×1
GOWN STRL REUS W/TWL XL LVL3 (GOWN DISPOSABLE) ×1
IV CONNECTOR ONE LINK NDLESS (IV SETS) ×9 IMPLANT
IV NS 500ML (IV SOLUTION) ×1
IV NS 500ML BAXH (IV SOLUTION) ×2 IMPLANT
KIT CATH CVC 3 LUMEN 7FR 8IN (MISCELLANEOUS) ×3 IMPLANT
KIT TURNOVER KIT A (KITS) ×3 IMPLANT
LABEL OR SOLS (LABEL) ×3 IMPLANT
LOOP RED MAXI  1X406MM (MISCELLANEOUS)
LOOP VESSEL MAXI 1X406 RED (MISCELLANEOUS) IMPLANT
LOOP VESSEL MINI 0.8X406 BLUE (MISCELLANEOUS) IMPLANT
LOOPS BLUE MINI 0.8X406MM (MISCELLANEOUS)
NEEDLE FILTER BLUNT 18X 1/2SAF (NEEDLE) ×1
NEEDLE FILTER BLUNT 18X1 1/2 (NEEDLE) ×2 IMPLANT
PACK EXTREMITY ARMC (MISCELLANEOUS) ×3 IMPLANT
PAD NEG PRESSURE SENSATRAC (MISCELLANEOUS) ×3 IMPLANT
PAD PREP 24X41 OB/GYN DISP (PERSONAL CARE ITEMS) ×3 IMPLANT
PENCIL ELECTRO HAND CTR (MISCELLANEOUS) ×3 IMPLANT
SCALPEL PROTECTED #11 DISP (BLADE) ×3 IMPLANT
SPONGE LAP 18X18 RF (DISPOSABLE) ×3 IMPLANT
STOCKINETTE STRL 4IN 9604848 (GAUZE/BANDAGES/DRESSINGS) ×3 IMPLANT
SUT ETHIBOND 0 36 GRN (SUTURE) ×6 IMPLANT
SUT ETHIBOND CT1 BRD #0 30IN (SUTURE) ×6 IMPLANT
SUT GORETEX 6.0 TT9 (SUTURE) ×3 IMPLANT
SUT GORETEX CV-6TTC-13 36IN (SUTURE) ×3 IMPLANT
SUT MNCRL+ 5-0 UNDYED PC-3 (SUTURE) ×2 IMPLANT
SUT MONOCRYL 5-0 (SUTURE) ×1
SUT PROLENE 6 0 BV (SUTURE) ×18 IMPLANT
SUT SILK 2 0 (SUTURE)
SUT SILK 2-0 18XBRD TIE 12 (SUTURE) IMPLANT
SUT SILK 3 0 (SUTURE)
SUT SILK 3-0 18XBRD TIE 12 (SUTURE) IMPLANT
SUT SILK 4 0 (SUTURE)
SUT SILK 4-0 18XBRD TIE 12 (SUTURE) IMPLANT
SUT VIC AB 3-0 SH 27 (SUTURE) ×4
SUT VIC AB 3-0 SH 27X BRD (SUTURE) ×8 IMPLANT
SYR 20ML LL LF (SYRINGE) ×3 IMPLANT
SYR 3ML LL SCALE MARK (SYRINGE) ×3 IMPLANT
TUBING CONNECTING 10 (TUBING) ×6 IMPLANT

## 2019-11-11 NOTE — ED Notes (Signed)
Family updated as to patient's status.

## 2019-11-11 NOTE — Progress Notes (Signed)
[facility]  ROUNDING NOTE   Subjective:  Patient well-known to [place] from prior admissions. Comes in with bleeding from her left upper extremity AV fistula. Upon admission she was hypotensive and lethargic. She received emergency transfusion while in the emergency department. She was subsequently taken to the operating room for ligation of her access. She is seen in the critical care unit and remains intubated at the moment. Postoperatively patient has been bradycardic.  Objective:  Vital signs in last 24 hours:  Temp:  [91.2 F (32.9 C)-96.2 F (35.7 C)] 95 F (35 C) (02/19 1500) Pulse Rate:  [43-65] 50 (02/19 1500) Resp:  [10-23] 10 (02/19 1500) BP: (56-161)/(35-77) 105/49 (02/19 1500) SpO2:  [97 %-100 %] 100 % (02/19 1500) FiO2 (%):  [30 %] 30 % (02/19 1400) Weight:  [88.1 kg] 88.1 kg (02/19 0808)  Weight change:  Filed Weights   11/11/19 0808  Weight: 88.1 kg    Intake/Output: No intake/output data recorded.   Intake/Output this shift:  Total I/O In: 3171 [I.V.:1500; Blood:911; IV Piggyback:760] Out: 75 [Blood:75]  Physical Exam: General: Critically ill-appearing  Head: Facial swelling noted  Eyes: Eyes closed  Neck: Supple, trachea midline  Lungs:  Clear to auscultation, vent assisted  Heart: S1S2 bradycardia noted  Abdomen:  Soft, nontender, bowel sounds present  Extremities: 1+ peripheral edema.  Neurologic: Intubated, not following commands  Skin: No lesions  Access: Ligated AV fistula in left upper extremity with wound VAC in place    Basic Metabolic Panel: Recent Labs  Lab 11/11/19 0612 11/11/19 1325  NA 132* 132*  K 4.2 4.1  CL 97* 98  CO2 23 24  GLUCOSE 151* 200*  BUN 27* 27*  CREATININE 7.24* 6.89*  CALCIUM 7.0* 6.8*  MG  --  2.0    Liver Function Tests: Recent Labs  Lab 11/11/19 0612 11/11/19 1325  AST 13* 15  ALT 9 9  ALKPHOS 49 41  BILITOT 0.8 1.1  PROT 4.4* 4.7*  ALBUMIN 2.2* 2.8*   No results for input(s):  LIPASE, AMYLASE in the last 168 hours. No results for input(s): AMMONIA in the last 168 hours.  CBC: Recent Labs  Lab 11/11/19 0612 11/11/19 0820 11/11/19 1325  WBC 4.6 6.6 8.4  NEUTROABS 2.0 4.2 6.5  HGB 5.5* 9.3* 8.6*  HCT 17.6* 28.2* 26.5*  MCV 89.8 87.9 82.3  PLT 161 131* 102*    Cardiac Enzymes: No results for input(s): CKTOTAL, CKMB, CKMBINDEX, TROPONINI in the last 168 hours.  BNP: Invalid input(s): POCBNP  CBG: Recent Labs  Lab 11/11/19 0808 11/11/19 1100  GLUCAP 116* 106*    Microbiology: Results for orders placed or performed during the hospital encounter of 11/11/19  Respiratory Panel by RT PCR (Flu A&B, Covid) - Nasopharyngeal Swab     Status: None   Collection Time: 11/11/19  7:00 AM   Specimen: Nasopharyngeal Swab  Result Value Ref Range Status   SARS Coronavirus 2 by RT PCR NEGATIVE NEGATIVE Final    Comment: (NOTE) SARS-CoV-2 target nucleic acids are NOT DETECTED. The SARS-CoV-2 RNA is generally detectable in upper respiratoy specimens during the acute phase of infection. The lowest concentration of SARS-CoV-2 viral copies this assay can detect is 131 copies/mL. A negative result does not preclude SARS-Cov-2 infection and should not be used as the sole basis for treatment or other patient management decisions. A negative result may occur with  improper specimen collection/handling, submission of specimen other than nasopharyngeal swab, presence of viral mutation(s) within the  areas targeted by this assay, and inadequate number of viral copies (<131 copies/mL). A negative result must be combined with clinical observations, patient history, and epidemiological information. The expected result is Negative. Fact Sheet for Patients:  PinkCheek.be Fact Sheet for Healthcare Providers:  GravelBags.it This test is not yet ap proved or cleared by the [place] FDA and  has been authorized for  detection and/or diagnosis of SARS-CoV-2 by FDA under an Emergency Use Authorization (EUA). This EUA will remain  in effect (meaning this test can be used) for the duration of the COVID-19 declaration under Section 564(b)(1) of the Act, 21 U.S.C. section 360bbb-3(b)(1), unless the authorization is terminated or revoked sooner.    Influenza A by PCR NEGATIVE NEGATIVE Final   Influenza B by PCR NEGATIVE NEGATIVE Final    Comment: (NOTE) The Xpert Xpress SARS-CoV-2/FLU/RSV assay is intended as an aid in  the diagnosis of influenza from Nasopharyngeal swab specimens and  should not be used as a sole basis for treatment. Nasal washings and  aspirates are unacceptable for Xpert Xpress SARS-CoV-2/FLU/RSV  testing. Fact Sheet for Patients: PinkCheek.be Fact Sheet for Healthcare Providers: GravelBags.it This test is not yet approved or cleared by the [place] FDA and  has been authorized for detection and/or diagnosis of SARS-CoV-2 by  FDA under an Emergency Use Authorization (EUA). This EUA will remain  in effect (meaning this test can be used) for the duration of the  Covid-19 declaration under Section 564(b)(1) of the Act, 21  U.S.C. section 360bbb-3(b)(1), unless the authorization is  terminated or revoked. Performed at [facility], [address] [place], [place] [address]   MRSA PCR Screening     Status: None   Collection Time: 11/11/19 11:31 AM   Specimen: Nasopharyngeal  Result Value Ref Range Status   MRSA by PCR NEGATIVE NEGATIVE Final    Comment:        The GeneXpert MRSA Assay (FDA approved for NASAL specimens only), is one component of a comprehensive MRSA colonization surveillance program. It is not intended to diagnose MRSA infection nor to guide or monitor treatment for MRSA infections. Performed at [facility], [address] [place], [place] [address]     Coagulation  Studies: Recent Labs    11/11/19 0612 11/11/19 1132  LABPROT 17.2* 18.1*  INR 1.4* 1.5*    Urinalysis: No results for input(s): COLORURINE, LABSPEC, PHURINE, GLUCOSEU, HGBUR, BILIRUBINUR, KETONESUR, PROTEINUR, UROBILINOGEN, NITRITE, LEUKOCYTESUR in the last 72 hours.  Invalid input(s): APPERANCEUR    Imaging: DG Chest Port 1 View  Result Date: 11/11/2019 CLINICAL DATA:  Respiratory failure. EXAM: PORTABLE CHEST 1 VIEW COMPARISON:  09/30/2019. FINDINGS: Endotracheal tube noted with tip 5 cm above the carina. Heart size normal. Bilateral pulmonary infiltrates/edema. Small bilateral pleural effusion. No pneumothorax. Left subclavian stents noted. Surgical staples noted over the left chest. IMPRESSION: 1.  Endotracheal tube noted with tip 5 cm above the carina. 2.  Diffuse bilateral pulmonary infiltrates/edema. Electronically Signed   By: Marcello Moores  Register   On: 11/11/2019 12:00     Medications:   . acetaminophen 1,000 mg (11/11/19 1501)  . [START ON 11/12/2019]  ceFAZolin (ANCEF) IV     . ipratropium-albuterol  3 mL Nebulization QID  . midazolam      . pantoprazole (PROTONIX) IV  40 mg Intravenous Daily   fentaNYL (SUBLIMAZE) injection, ondansetron (ZOFRAN) IV  Assessment/ Plan:  59 y.o. female with past medical history of ESRD on HD MWF, anemia of chronic kidney  disease, secondary hyperparathyroidism who presents now with hemorrhage from her left upper extremity AV fistula with development of hypovolemic shock and is status post ligation of left upper extremity AV fistula.  [facility]MWF2/86.5  1.  ESRD on HD MWF.  Patient just suffered hypovolemic shock from ruptured hemodialysis access.  She is status post ligation of left upper extremity AV fistula with wound VAC placed.  She has no dialysis access at the moment.  We will await stabilization of the patient's condition before considering dialysis.  No immediate need for dialysis at the moment.  2.  Hypovolemic shock.  Blood  pressure was as low as 56/35 upon admission.  Patient's hemoglobin was 5.5 upon admission.  She has received blood transfusion.  Continue to monitor blood pressure closely.  Not requiring pressors at the moment.  3.  Anemia of chronic kidney disease.  Patient has received blood transfusion this admission.  Continue to monitor hemoglobin closely.  4.  Secondary hyperparathyroidism.  Evaluate bone metabolism parameters once she starts dialysis.  5.  Acute respiratory failure.  Still on the ventilator at the moment.  Weaning as per pulmonary/critical care.     LOS: 0 Norabelle Kondo 2/19/20213:13 PM

## 2019-11-11 NOTE — ED Notes (Signed)
Patient extremely edematous in lower extremities

## 2019-11-11 NOTE — Transfer of Care (Signed)
Immediate Anesthesia Transfer of Care Note  Patient: [name]  Procedure(s) Performed: LIGATION OF ARTERIOVENOUS  FISTULA (Left ) CENTRAL LINE INSERTION (Right )  Patient Location: ICU  Anesthesia Type:General  Level of Consciousness: Patient remains intubated per anesthesia plan  Airway & Oxygen Therapy: Patient remains intubated per anesthesia plan  Post-op Assessment: Report given to RN and Post -op Vital signs reviewed and stable  Post vital signs: Reviewed and stable  Last Vitals:  Vitals Value Taken Time  BP    Temp    Pulse 47 11/11/19 1100  Resp 19 11/11/19 1100  SpO2 100 % 11/11/19 1100  Vitals shown include unvalidated device data.  Last Pain: There were no vitals filed for this visit.       Complications: No apparent anesthesia complications

## 2019-11-11 NOTE — Anesthesia Preprocedure Evaluation (Addendum)
Anesthesia Evaluation  Patient identified by MRN, date of birth, ID band Patient awake    Reviewed: Allergy & Precautions, H&P , NPO status , Patient's Chart, lab work & pertinent test results  History of Anesthesia Complications Negative for: history of anesthetic complications  Airway Mallampati: III  TM Distance: >3 FB Neck ROM: limited    Dental  (+) Poor Dentition, Upper Dentures, Lower Dentures   Pulmonary neg shortness of breath, COPD,           Cardiovascular + CAD and + Cardiac Stents  (-) Past MI      Neuro/Psych CVA negative psych ROS   GI/Hepatic negative GI ROS, Neg liver ROS, neg GERD  ,  Endo/Other  negative endocrine ROS  Renal/GU DialysisRenal disease     Musculoskeletal   Abdominal   Peds  Hematology  (+) Blood dyscrasia, anemia ,   Anesthesia Other Findings   Reproductive/Obstetrics negative OB ROS                           Anesthesia Physical Anesthesia Plan  ASA: V and emergent  Anesthesia Plan: General ETT and Rapid Sequence   Post-op Pain Management:    Induction: Intravenous  PONV Risk Score and Plan: Ondansetron, Dexamethasone, Midazolam and Treatment may vary due to age or medical condition  Airway Management Planned: Oral ETT and Video Laryngoscope Planned  Additional Equipment:   Intra-op Plan:   Post-operative Plan: Extubation in OR and Possible Post-op intubation/ventilation  Informed Consent: I have reviewed the patients History and Physical, chart, labs and discussed the procedure including the risks, benefits and alternatives for the proposed anesthesia with the patient or authorized representative who has indicated his/her understanding and acceptance.     Dental Advisory Given  Plan Discussed with: Anesthesiologist, CRNA and Surgeon  Anesthesia Plan Comments: (Actively bleeding brought directly from ED to PreOp, History and consent from  the patients son  Son consented for risks of anesthesia including but not limited to:  - adverse reactions to medications - damage to teeth, lips or other oral mucosa - sore throat or hoarseness - Damage to heart, brain, lungs or loss of life  He voiced understanding.)     Anesthesia Quick Evaluation

## 2019-11-11 NOTE — Consult Note (Addendum)
Reason for Consult: Shock, assistance with ventilator management Referring Physician: Dr. Delana Meyer  Note that history is taken from available records and from discussion with Dr. Delana Meyer and ED physician as the patient is currently intubated and unable to provide history.  Also note that the patient was to be seen initially however full evaluation was delayed due to the patient having been taken to the OR.   [name] is an 59 y.o. female.  HPI: 59 year old with a history of ESRD on dialysis MWF who presented to the emergency room due to bleeding left upper extremity dialysis access.  The bleeding was excessive and the patient was unable to control it.  The patient lives alone so is hard to determine how long she tried to control the bleeding herself.  It is believed that probably was an hour.  EMS was activated and upon arrival they reported copious amounts of blood in the home.  They estimate was a couple of liters.  When they arrived the patient was hypotensive, pale and lethargic.  She was brought immediately to the emergency room and was noted to be minimally responsive upon arrival.  Initial H&H was 5.5 and 17.6 patient was in hemorrhagic shock.  She was evaluated by vascular and emergency transfusion x2 was administered.  She was taken to the OR emergently for management of the bleeding.  Patient required excision of infected left arm brachial artery AV graft and placement of a wound VAC.  The patient was noted to be bradycardic during the procedure and it was elected to leave her intubated.  She presents to the ICU intubated and mechanically ventilated.  Heart rate is in the 44 beats per minute range however hemodynamically she is stable.  Of note she had been on the emergency room on 09 November 2019 with a complaint of pain of her left arm fistula and associated redness and purulent drainage.  Evaluation at that time showed findings consistent with superficial changes only.  Photographs document  this.  She was placed on doxycycline and topical Bactroban.  She has apparently been having difficulties with the AV fistula.    Past Medical History:  Diagnosis Date  . Anemia   . Anginal pain ([place])   . Anxiety   . Arthritis   . Asthma   . Broken wrist   . Bronchitis   . chronic diastolic CHF 5/95/6387  . COPD (chronic obstructive pulmonary disease) ([place])   . Coronary artery disease    a. cath 2013: stenting to RCA (report not available); b. cath 2014: LM nl, pLAD 40%, mLAD nl, ost LCx 40%, mid LCx nl, pRCA 30% @ site of prior stent, mRCA 50%  . Depression   . Diabetes mellitus without complication ([place])   . Diabetic neuropathy ([place])   . dialysis 2006  . Diverticulosis   . Dizziness   . Dyspnea   . Elevated lipids   . Environmental and seasonal allergies   . ESRD (end stage renal disease) on dialysis ([place])    M-W-F  . GERD (gastroesophageal reflux disease)   . Headache   . History of anemia due to chronic kidney disease   . History of hiatal hernia   . HOH (hard of hearing)   . Hx of pancreatitis 2015  . Hypertension   . Lower extremity edema   . Mitral regurgitation    a. echo 10/2013: EF 62%, noWMA, mildly dilated LA, mild to mod MR/TR, GR1DD  . Myocardial infarction ([place])   .  Orthopnea   . Pneumonia   . Renal cancer ([place])   . Renal insufficiency    Pt is on dialysis on M,W + F.  . Wheezing         Patient Active Problem List   Diagnosis Date Noted  . Leg pain 10/02/2019  . CAP (community acquired pneumonia) 10/01/2019  . Atherosclerosis of native arteries of extremity with intermittent claudication ([place]) 05/01/2019  . Right-sided headache   . COPD (chronic obstructive pulmonary disease) ([place]) 10/06/2018  . Syncope 10/04/2018  . Symptomatic anemia 06/18/2018  . Gastroparesis due to DM ([place]) 01/18/2018  . Complication of vascular access for dialysis 12/04/2017  . Osteomyelitis ([place]) 09/30/2017  . Carotid stenosis  06/18/2017  . Shortness of breath 05/04/2017  . Cellulitis of lower extremity 07/29/2016  . Chronic venous insufficiency 07/29/2016  . Lymphedema 07/29/2016  . TIA (transient ischemic attack) 04/21/2016  . Altered mental status 04/08/2016  . Hyperammonemia ([place]) 04/08/2016  . Elevated troponin 04/08/2016  . Depression 04/08/2016  . Depression, major, recurrent, severe with psychosis ([place]) 04/08/2016  . Blood in stool   . Intractable cyclical vomiting with nausea   . Reflux esophagitis   . Gastritis   . Generalized abdominal pain   . Uncontrollable vomiting   . Major depressive disorder, recurrent episode, moderate ([place]) 03/15/2016  . Adjustment disorder with mixed anxiety and depressed mood 03/15/2016  . Malnutrition of moderate degree 12/01/2015  . Renal mass   . Dyspnea   . Acute renal failure ([place])   . Respiratory failure ([place])   . High temperature 11/14/2015  . Pulmonary edema   . Encounter for central line placement   . Encounter for orogastric (OG) tube placement   . Nausea 11/12/2015  . Hyperkalemia 10/03/2015  . Diarrhea, unspecified 07/22/2015  . Pneumonia 05/21/2015  . Hypoglycemia 04/24/2015  . Unresponsiveness 04/24/2015  . Bradycardia 04/24/2015  . Hypothermia 04/24/2015  . Acute respiratory failure ([place]) 04/24/2015  . Acute diastolic CHF (congestive heart failure) ([place]) 04/05/2015  . Diabetic gastroparesis ([place]) 04/05/2015  . Hypokalemia 04/05/2015  . Generalized weakness 04/05/2015  . Acute pulmonary edema ([place]) 04/03/2015  . Nausea and vomiting 04/03/2015  . Hypoglycemia associated with diabetes ([place]) 04/03/2015  . Anemia of chronic disease 04/03/2015  . Secondary hyperparathyroidism ([place]) 04/03/2015  . Pressure ulcer 04/02/2015  . Acute respiratory failure with hypoxia ([place]) 04/01/2015  . Adjustment disorder with anxiety 03/14/2015  . Somatic symptom disorder, mild 03/08/2015  . Coronary artery disease involving native coronary artery of  native heart without angina pectoris   . Nausea & vomiting 03/06/2015  . Abdominal pain 03/06/2015  . DM (diabetes mellitus) ([place]) 03/06/2015  . HTN (hypertension) 03/06/2015  . Gastroparesis 02/24/2015  . Pleural effusion 02/19/2015  . HCAP (healthcare-associated pneumonia) 02/19/2015  . End-stage renal disease on hemodialysis ([place]) 02/19/2015         Past Surgical History:  Procedure Laterality Date  . A/V SHUNTOGRAM Left 01/20/2018   Procedure: A/V SHUNTOGRAM;  Surgeon: Algernon Huxley, MD;  Location: [facility];  Service: Cardiovascular;  Laterality: Left;  . ABDOMINAL HYSTERECTOMY  1992  . AMPUTATION TOE Left 10/02/2017   Procedure: AMPUTATION TOE-LEFT GREAT TOE;  Surgeon: Albertine Patricia, DPM;  Location: [facility];  Service: Podiatry;  Laterality: Left;  . APPENDECTOMY    . ARTERY BIOPSY Right 10/11/2018   Procedure: BIOPSY TEMPORAL ARTERY;  Surgeon: Vickie Epley, MD;  Location: [facility];  Service: General;  Laterality: Right;  . CARDIAC CATHETERIZATION  Left 07/26/2015   Procedure: Left Heart Cath and Coronary Angiography;  Surgeon: Dionisio David, MD;  Location: [facility];  Service: Cardiovascular;  Laterality: Left;  . CATARACT EXTRACTION W/ INTRAOCULAR LENS IMPLANT Right   . CATARACT EXTRACTION W/PHACO Left 03/10/2017   Procedure: CATARACT EXTRACTION PHACO AND INTRAOCULAR LENS PLACEMENT (IOC);  Surgeon: Birder Robson, MD;  Location: [facility];  Service: Ophthalmology;  Laterality: Left;  [place] 00:51.9 AP% 14.2 CDE 7.39 fluid pack lot # [id number]  . CHOLECYSTECTOMY    . COLONOSCOPY WITH PROPOFOL N/A 08/12/2016   Procedure: COLONOSCOPY WITH PROPOFOL;  Surgeon: Lollie Sails, MD;  Location: [facility] ENDOSCOPY;  Service: Endoscopy;  Laterality: N/A;  . DIALYSIS FISTULA CREATION Left    upper arm  . dialysis grafts    . ESOPHAGOGASTRODUODENOSCOPY N/A 03/08/2015   Procedure: ESOPHAGOGASTRODUODENOSCOPY (EGD);  Surgeon: Manya Silvas, MD;   Location: [facility] ENDOSCOPY;  Service: Endoscopy;  Laterality: N/A;  . ESOPHAGOGASTRODUODENOSCOPY (EGD) WITH PROPOFOL N/A 03/18/2016   Procedure: ESOPHAGOGASTRODUODENOSCOPY (EGD) WITH PROPOFOL;  Surgeon: [name], MD;  Location: [facility] ENDOSCOPY;  Service: Endoscopy;  Laterality: N/A;  . EYE SURGERY Right 2018  . FECAL TRANSPLANT N/A 08/23/2015   Procedure: FECAL TRANSPLANT;  Surgeon: Manya Silvas, MD;  [facility];  Service: Endoscopy;  Laterality: N/A;  . HAND SURGERY Bilateral   . IR RADIOLOGIST EVAL & MGMT  07/28/2019  . IR RADIOLOGIST EVAL & MGMT  08/11/2019  . PERIPHERAL VASCULAR CATHETERIZATION N/A 12/20/2015   Procedure: Thrombectomy of dialysis access versus permcath placement;  Surgeon: Algernon Huxley, MD;  Location: [facility];  Service: Cardiovascular;  Laterality: N/A;  . PERIPHERAL VASCULAR CATHETERIZATION N/A 12/20/2015   Procedure: A/V Shunt Intervention;  Surgeon: Algernon Huxley, MD;  Location: [facility];  Service: Cardiovascular;  Laterality: N/A;  . PERIPHERAL VASCULAR CATHETERIZATION N/A 12/20/2015   Procedure: A/V Shuntogram/Fistulagram;  Surgeon: Algernon Huxley, MD;  Location: [facility];  Service: Cardiovascular;  Laterality: N/A;  . PERIPHERAL VASCULAR CATHETERIZATION N/A 01/02/2016   Procedure: A/V Shuntogram/Fistulagram;  Surgeon: Algernon Huxley, MD;  Location: [facility];  Service: Cardiovascular;  Laterality: N/A;  . PERIPHERAL VASCULAR CATHETERIZATION N/A 01/02/2016   Procedure: A/V Shunt Intervention;  Surgeon: Algernon Huxley, MD;  Location: [facility];  Service: Cardiovascular;  Laterality:    Past Surgical History:  Procedure Laterality Date  . LIGATION OF ARTERIOVENOUS  FISTULA Left 11/11/2019   Allergies Ace inhibitors, Ativan [lorazepam], Compazine [prochlorperazine edisylate], Sumatriptan succinate, Dilaudid [hydromorphone hcl], Ondansetron, Zofran [ondansetron hcl], Codeine, Gabapentin, Lac bovis,  Losartan, Oxycodone, Prochlorperazine, Reglan [metoclopramide], Scopolamine, Tape, and Tapentadol       Family History  Problem Relation Age of Onset  . Kidney disease Mother   . Diabetes Mother   . Cancer Father   . Kidney disease Sister     Social History        Tobacco Use  . Smoking status: Former Smoker    Packs/day: 0.50    Years: 40.00    Pack years: 20.00    Types: Cigarettes    Quit date: 02/13/2015    Years since quitting: 4.7  . Smokeless tobacco: Never Used  Substance Use Topics  . Alcohol use: Not Currently    Comment: glass wine week per pt  . Drug use: Yes    Types: Marijuana    Comment: once a day     Medications:  I have reviewed the patient's  current medications. Prior to Admission:  Medications Prior to Admission  Medication Sig Dispense Refill Last Dose  . albuterol (VENTOLIN HFA) 108 (90 Base) MCG/ACT inhaler Inhale 2 puffs into the lungs every 4 (four) hours as needed for wheezing or shortness of breath.   Unknown at PRN  . amLODipine (NORVASC) 10 MG tablet Take 10 mg by mouth daily.     Marland Kitchen aspirin EC 81 MG tablet Take 81 mg by mouth daily.     Marland Kitchen atorvastatin (LIPITOR) 20 MG tablet Take 20 mg by mouth daily.     . carvedilol (COREG) 6.25 MG tablet Take 6.25 mg by mouth 2 (two) times daily.     . citalopram (CELEXA) 20 MG tablet Take 20 mg by mouth daily.     . clopidogrel (PLAVIX) 75 MG tablet Take 75 mg by mouth daily.     Marland Kitchen CREON 12000 units CPEP capsule Take 12,000-36,000 Units by mouth See admin instructions. Take 3 capsules (36000u) by mouth three times daily with meals and take 1 to 2 capsules (12000u-24000u) by mouth daily with snacks     . doxycycline (VIBRAMYCIN) 100 MG capsule Take 100 mg by mouth 2 (two) times daily.     . fluticasone (FLONASE) 50 MCG/ACT nasal spray Place 2 sprays into both nostrils daily.     . furosemide (LASIX) 80 MG tablet Take 80 mg by mouth daily.     Marland Kitchen gabapentin (NEURONTIN) 100 MG  capsule Take 200 mg by mouth at bedtime.      Marland Kitchen HYDROcodone-acetaminophen (NORCO/VICODIN) 5-325 MG tablet Take 1 tablet by mouth 4 (four) times daily as needed for pain.   Past Week at PRN  . meclizine (ANTIVERT) 25 MG tablet Take 25 mg by mouth every 6 (six) hours as needed for dizziness.   Past Week at PRN  . mupirocin ointment (BACTROBAN) 2 % Apply 1 application topically 3 (three) times daily.     . pantoprazole (PROTONIX) 40 MG tablet Take 40 mg by mouth daily.       Results for orders placed or performed during the hospital encounter of 11/11/19 (from the past 48 hour(s))  CBC with Differential     Status: Abnormal   Collection Time: 11/11/19  6:12 AM  Result Value Ref Range   WBC 4.6 4.0 - 10.5 K/uL   RBC 1.96 (L) 3.87 - 5.11 MIL/uL   Hemoglobin 5.5 (L) 12.0 - 15.0 g/dL   HCT 17.6 (L) 36.0 - 46.0 %   MCV 89.8 80.0 - 100.0 fL   MCH 28.1 26.0 - 34.0 pg   MCHC 31.3 30.0 - 36.0 g/dL   RDW 17.4 (H) 11.5 - 15.5 %   Platelets 161 150 - 400 K/uL   nRBC 0.0 0.0 - 0.2 %   Neutrophils Relative % 44 %   Neutro Abs 2.0 1.7 - 7.7 K/uL   Lymphocytes Relative 38 %   Lymphs Abs 1.8 0.7 - 4.0 K/uL   Monocytes Relative 9 %   Monocytes Absolute 0.4 0.1 - 1.0 K/uL   Eosinophils Relative 8 %   Eosinophils Absolute 0.4 0.0 - 0.5 K/uL   Basophils Relative 0 %   Basophils Absolute 0.0 0.0 - 0.1 K/uL   Immature Granulocytes 1 %   Abs Immature Granulocytes 0.03 0.00 - 0.07 K/uL    Comment: Performed at [facility], [address] [place], [place] [address]  Comprehensive metabolic panel     Status: Abnormal   Collection Time: 11/11/19  6:12  AM  Result Value Ref Range   Sodium 132 (L) 135 - 145 mmol/L   Potassium 4.2 3.5 - 5.1 mmol/L   Chloride 97 (L) 98 - 111 mmol/L   CO2 23 22 - 32 mmol/L   Glucose, Bld 151 (H) 70 - 99 mg/dL   BUN 27 (H) 6 - 20 mg/dL   Creatinine, Ser 7.24 (H) 0.44 - 1.00 mg/dL   Calcium 7.0 (L) 8.9 - 10.3 mg/dL   Total Protein 4.4 (L) 6.5 - 8.1 g/dL   Albumin  2.2 (L) 3.5 - 5.0 g/dL   AST 13 (L) 15 - 41 U/L   ALT 9 0 - 44 U/L   Alkaline Phosphatase 49 38 - 126 U/L   Total Bilirubin 0.8 0.3 - 1.2 mg/dL   GFR calc non Af Amer 6 (L) >60 mL/min   GFR calc Af Amer 6 (L) >60 mL/min   Anion gap 12 5 - 15    Comment: Performed at [facility], [address] [place], [place] [address]  Protime-INR     Status: Abnormal   Collection Time: 11/11/19  6:12 AM  Result Value Ref Range   Prothrombin Time 17.2 (H) 11.4 - 15.2 seconds   INR 1.4 (H) 0.8 - 1.2    Comment: (NOTE) INR goal varies based on device and disease states. Performed at [facility], [address] [place], [place] [address]   APTT     Status: Abnormal   Collection Time: 11/11/19  6:12 AM  Result Value Ref Range   aPTT 42 (H) 24 - 36 seconds    Comment:        IF BASELINE aPTT IS ELEVATED, SUGGEST PATIENT RISK ASSESSMENT BE USED TO DETERMINE APPROPRIATE ANTICOAGULANT THERAPY. Performed at [facility], [address] [place], [place] [address]   Type and screen [place]     Status: None (Preliminary result)   Collection Time: 11/11/19  6:15 AM  Result Value Ref Range   ABO/RH(D) O POS    Antibody Screen NEG    Sample Expiration      11/14/2019,2359 Performed at [facility], [address], [place], [place] [address]    Unit Number [id number]    Blood Component Type RED CELLS,LR    Unit division 00    Status of Unit ISSUED    Transfusion Status OK TO TRANSFUSE    Crossmatch Result COMPATIBLE    Unit tag comment EMERGENCY RELEASE    Unit Number [id number]    Blood Component Type RED CELLS,LR    Unit division 00    Status of Unit ISSUED    Transfusion Status OK TO TRANSFUSE    Crossmatch Result COMPATIBLE    Unit tag comment EMERGENCY RELEASE    Unit Number [id number]    Blood Component Type RED CELLS,LR    Unit division 00    Status of Unit ALLOCATED    Transfusion Status OK TO TRANSFUSE     Crossmatch Result COMPATIBLE    Unit Number [id number]    Blood Component Type RED CELLS,LR    Unit division 00    Status of Unit ALLOCATED    Transfusion Status OK TO TRANSFUSE    Crossmatch Result COMPATIBLE    Unit Number [id number]    Blood Component Type RED CELLS,LR    Unit division 00    Status of Unit ALLOCATED    Transfusion Status OK TO TRANSFUSE    Crossmatch Result COMPATIBLE  Unit Number [id number]    Blood Component Type RED CELLS,LR    Unit division 00    Status of Unit ALLOCATED    Transfusion Status OK TO TRANSFUSE    Crossmatch Result COMPATIBLE   Respiratory Panel by RT PCR (Flu A&B, Covid) - Nasopharyngeal Swab     Status: None   Collection Time: 11/11/19  7:00 AM   Specimen: Nasopharyngeal Swab  Result Value Ref Range   SARS Coronavirus 2 by RT PCR NEGATIVE NEGATIVE    Comment: (NOTE) SARS-CoV-2 target nucleic acids are NOT DETECTED. The SARS-CoV-2 RNA is generally detectable in upper respiratoy specimens during the acute phase of infection. The lowest concentration of SARS-CoV-2 viral copies this assay can detect is 131 copies/mL. A negative result does not preclude SARS-Cov-2 infection and should not be used as the sole basis for treatment or other patient management decisions. A negative result may occur with  improper specimen collection/handling, submission of specimen other than nasopharyngeal swab, presence of viral mutation(s) within the areas targeted by this assay, and inadequate number of viral copies (<131 copies/mL). A negative result must be combined with clinical observations, patient history, and epidemiological information. The expected result is Negative. Fact Sheet for Patients:  PinkCheek.be Fact Sheet for Healthcare Providers:  GravelBags.it This test is not yet ap proved or cleared by the [place] FDA and  has been authorized for detection and/or diagnosis  of SARS-CoV-2 by FDA under an Emergency Use Authorization (EUA). This EUA will remain  in effect (meaning this test can be used) for the duration of the COVID-19 declaration under Section 564(b)(1) of the Act, 21 U.S.C. section 360bbb-3(b)(1), unless the authorization is terminated or revoked sooner.    Influenza A by PCR NEGATIVE NEGATIVE   Influenza B by PCR NEGATIVE NEGATIVE    Comment: (NOTE) The Xpert Xpress SARS-CoV-2/FLU/RSV assay is intended as an aid in  the diagnosis of influenza from Nasopharyngeal swab specimens and  should not be used as a sole basis for treatment. Nasal washings and  aspirates are unacceptable for Xpert Xpress SARS-CoV-2/FLU/RSV  testing. Fact Sheet for Patients: PinkCheek.be Fact Sheet for Healthcare Providers: GravelBags.it This test is not yet approved or cleared by the [place] FDA and  has been authorized for detection and/or diagnosis of SARS-CoV-2 by  FDA under an Emergency Use Authorization (EUA). This EUA will remain  in effect (meaning this test can be used) for the duration of the  Covid-19 declaration under Section 564(b)(1) of the Act, 21  U.S.C. section 360bbb-3(b)(1), unless the authorization is  terminated or revoked. Performed at [facility], [address] [place], [place] [address]   Prepare RBC (crossmatch)     Status: None   Collection Time: 11/11/19  7:02 AM  Result Value Ref Range   Order Confirmation      ORDER PROCESSED BY BLOOD BANK Performed at [facility], [address] [place], [place] [address]   Prepare RBC     Status: None   Collection Time: 11/11/19  7:20 AM  Result Value Ref Range   Order Confirmation      ORDER PROCESSED BY BLOOD BANK Performed at [facility], [address] [place], [place] [address]   Glucose, capillary     Status: Abnormal   Collection Time: 11/11/19  8:08 AM  Result Value Ref Range    Glucose-Capillary 116 (H) 70 - 99 mg/dL    No results found.  Review of Systems  Unable to perform  ROS: Intubated   Blood pressure 136/64, pulse (!) 43, temperature (!) 96.2 F (35.7 C), resp. rate (!) 23, height 5\' 3"  (1.6 m), weight 88.1 kg, SpO2 100 %. Physical Exam GENERAL: Pale, chronically ill-appearing woman looks older than stated age.  Intubated, mechanically ventilated, still with effects of general anesthesia, no sedation. HEAD: Normocephalic, atraumatic.  EYES: Pupils equal, round, reactive to light.  No scleral icterus.  Conjunctiva pale. MOUTH: Orotracheally intubated.  Oral mucosa dry. NECK: Supple. No thyromegaly. No nodules. No JVD.  Trachea midline, no crepitus PULMONARY: Lungs clear to auscultation bilaterally.  Good air entry bilaterally. CARDIOVASCULAR: S1 and S2.  Bradycardic rate with regular rhythm.  No murmurs appreciated. GASTROINTESTINAL: Protuberant/obese abdomen, soft, nondistended, normoactive bowel sounds.  Cannot elicit any tenderness (grimacing) on palpation. MUSCULOSKELETAL: No joint deformity, no clubbing, no edema.  Wound on the left upper extremity with wound VAC in place. NEUROLOGIC: Still sedated further assessment can be made. SKIN: Intact,warm,dry.  Nailbeds pale. PSYCH: Cannot assess due to sedation.  Assessment/Plan:  Acute respiratory failure, ventilator dependent Post surgical intervention To new ventilator support for now Wean off vent as tolerated, assess for SBT later today Ventilator settings reviewed with RT VAP prevention protocol Nebulizers for mucociliary clearance  Profound bradycardia Hemodynamically stable Check thyroid function Check 2D echo Consulted with cardiology, Dr. Rockey Situ, appreciate input Trial of glucagon  Hemorrhagic shock Dialysis access hemorrhage Acute blood loss anemia superimposed on chronic anemia Patient was transfused Infected ulcer at dialysis access site Excision of the left arm graft per  vascular Volume resuscitate as necessary Withhold dialysis today Discussed with Dr. Delana Meyer  ESRD Consultation with nephrology  Will need temporary access for dialysis per vascular Withhold dialysis today Discussed with Dr. Holley Raring  Infected dialysis graft Removed by vascular surgery On Ancef Cultures pending   Critical care time: 60 minutes.  Renold Don, MD [facility] 11/11/2019, 8:14 AM    *This note was dictated using voice recognition software/Dragon.  Despite best efforts to proofread, errors can occur which can change the meaning.  Any change was purely unintentional.

## 2019-11-11 NOTE — ED Notes (Signed)
IV fluid bolus started for BP, emergency blood ordered

## 2019-11-11 NOTE — H&P (Signed)
[facility] Admission History & Physical  MRN : [id number]  [name] is a 59 y.o. (07/19/1961) female who presents with chief complaint of  Chief Complaint  Patient presents with  . Vascular Access Problem   History of Present Illness:  The patient is a 59 year old female with a known history of end-stage renal disease with a chief complaint of " bleeding dialysis access".  Patient endorses a history of an acute bleeding episode involving her left upper extremity dialysis access.  Patient notes the bleeding was excessive and she was unable to control it on her own prompting her to seek medical attention emergency department.  Patient presented to the emergency department hypotensive and lethargic.  Emergency x2 transfusion administered.  Patient denies any fever, nausea vomiting.  Denies any chest pain or shortness of breath.  Vascular surgery was consulted by Dr. Karma Greaser emergently for hemorrhaging dialysis access.  Current Facility-Administered Medications  Medication Dose Route Frequency Provider Last Rate Last Admin  . 0.9 %  sodium chloride infusion (Manually program via Guardrails IV Fluids)   Intravenous Once Awilda Bill, NP      . ondansetron [facility]) injection 4 mg  4 mg Intravenous Q6H PRN Awilda Bill, NP       No current outpatient medications on file.   No past medical history on file.  Social History Social History   Tobacco Use  . Smoking status: Not on file  Substance Use Topics  . Alcohol use: Not on file  . Drug use: Not on file   Family History No family history on file.  Denies family history of peripheral artery disease, venous disease or renal disease.  Not on File  REVIEW OF SYSTEMS (Negative unless checked)  Constitutional: [] Weight loss  [] Fever  [] Chills Cardiac: [] Chest pain   [] Chest pressure   [] Palpitations   [] Shortness of breath when laying flat   [] Shortness of breath at rest   [] Shortness of breath with  exertion. Vascular:  [] Pain in legs with walking   [] Pain in legs at rest   [] Pain in legs when laying flat   [] Claudication   [] Pain in feet when walking  [] Pain in feet at rest  [] Pain in feet when laying flat   [] History of DVT   [] Phlebitis   [x] Swelling in legs   [] Varicose veins   [] Non-healing ulcers Pulmonary:   [] Uses home oxygen   [] Productive cough   [] Hemoptysis   [] Wheeze  [] COPD   [] Asthma Neurologic:  [] Dizziness  [] Blackouts   [] Seizures   [] History of stroke   [] History of TIA  [] Aphasia   [] Temporary blindness   [] Dysphagia   [] Weakness or numbness in arms   [] Weakness or numbness in legs Musculoskeletal:  [] Arthritis   [] Joint swelling   [] Joint pain   [] Low back pain Hematologic:  [] Easy bruising  [] Easy bleeding   [] Hypercoagulable state   [] Anemic  [] Hepatitis Gastrointestinal:  [] Blood in stool   [] Vomiting blood  [] Gastroesophageal reflux/heartburn   [] Difficulty swallowing. Genitourinary:  [x] Chronic kidney disease   [] Difficult urination  [] Frequent urination  [] Burning with urination   [] Blood in urine Skin:  [] Rashes   [] Ulcers   [] Wounds Psychological:  [] History of anxiety   []  History of major depression.  Bleeding from dialysis access site  Physical Examination  Vitals:   11/11/19 0652 11/11/19 0653 11/11/19 0700 11/11/19 0715  BP:  (!) 118/50 (!) 124/44 (!) 119/50  Pulse: (!) 51 (!) 50 (!) 49 (!) 46  Resp: 12 12 12 11   SpO2: 100% 100% 100% 100%  Height:       There is no height or weight on file to calculate BMI. Gen: WD/WN, NAD Head: [place]/AT, No temporalis wasting.  Ear/Nose/Throat: Hearing grossly intact, nares w/o erythema or drainage, oropharynx w/o Erythema/Exudate, Eyes: Sclera non-icteric, conjunctiva clear Neck: Supple, no nuchal rigidity.  No JVD.  Pulmonary:  Good air movement, no increased work of respiration or use of accessory muscles  Cardiac: RRR, normal S1, S2, no Murmurs, rubs or gallops. Vascular:  Vessel Right Left  Radial Palpable  Palpable  Ulnar Palpable Palpable  Brachial Palpable Palpable                           Left Upper Extremity: Soft. Warm distally to fingers.  Bruit and thrill noted to access.  Gastrointestinal: soft, non-tender/non-distended. No guarding/reflex. No masses, surgical incisions, or scars. Musculoskeletal: M/S 5/5 throughout.  No deformity or atrophy.  Moderate edema Neurologic: Sensation grossly intact in extremities.  Symmetrical.  Speech is fluent. Motor exam as listed above. Psychiatric: Judgment intact, Mood & affect appropriate for pt's clinical situation. Dermatologic: No rashes or ulcers noted.  No cellulitis or open wounds. Lymph : No Cervical, Axillary, or Inguinal lymphadenopathy.  CBC Lab Results  Component Value Date   WBC 4.6 11/11/2019   HGB 5.5 (L) 11/11/2019   HCT 17.6 (L) 11/11/2019   MCV 89.8 11/11/2019   PLT 161 11/11/2019   BMET    Component Value Date/Time   NA 132 (L) 11/11/2019 0612   K 4.2 11/11/2019 0612   CL 97 (L) 11/11/2019 0612   CO2 23 11/11/2019 0612   GLUCOSE 151 (H) 11/11/2019 0612   BUN 27 (H) 11/11/2019 0612   CREATININE 7.24 (H) 11/11/2019 0612   CALCIUM 7.0 (L) 11/11/2019 0612   GFRNONAA 6 (L) 11/11/2019 0612   GFRAA 6 (L) 11/11/2019 0612   CrCl cannot be calculated (Unknown ideal weight.).  COAG Lab Results  Component Value Date   INR 1.4 (H) 11/11/2019   Radiology No results found.  Assessment/Plan The patient is a 59 year old female with a known history of end-stage renal disease with a chief complaint of " bleeding dialysis access".  1. Dialysis Access Hemorrhage:  Patient with uncontrollable bleeding from left upper extremity hero graft.  Will take patient emergently to the operating room for ligation and attempt to control bleeding.  Since the patient will be without access we will place a PermCath.  Procedure, risks and benefits explained to the patient.  All questions answered.  Patient wishes to proceed.  2.  PAD: Known peripheral artery disease of the bilateral lower extremity.  Asymptomatic at this time.  No indication for intervention at this time.  3.  Hypertension: On appropriate medications. Encouraged good control as its slows the progression of atherosclerotic disease  Discussed with Dr. Francene Castle, PA-C  11/11/2019 7:38 AM

## 2019-11-11 NOTE — ED Notes (Signed)
Patient affirms to this RN and charge RN that she would like to be a full code

## 2019-11-11 NOTE — Anesthesia Postprocedure Evaluation (Signed)
Anesthesia Post Note  Patient: [name]  Procedure(s) Performed: LIGATION OF ARTERIOVENOUS  FISTULA (Left ) CENTRAL LINE INSERTION (Right )  Patient location during evaluation: SICU Anesthesia Type: General Level of consciousness: sedated Pain management: pain level controlled Vital Signs Assessment: post-procedure vital signs reviewed and stable Respiratory status: patient remains intubated per anesthesia plan Cardiovascular status: stable Postop Assessment: no apparent nausea or vomiting Anesthetic complications: no     Last Vitals:  Vitals:   11/11/19 1056 11/11/19 1100  BP: (!) 156/77   Pulse:    Resp:    Temp:    SpO2:  100%    Last Pain: There were no vitals filed for this visit.               Precious Haws Ally Knodel

## 2019-11-11 NOTE — ED Notes (Signed)
MD attempting to place CVC

## 2019-11-11 NOTE — ED Notes (Signed)
MD at bedside to perform ultrasound IV

## 2019-11-11 NOTE — ED Provider Notes (Signed)
[facility] Emergency Department Provider Note  ____________________________________________   First MD Initiated Contact with Patient 11/11/19 [phone]     (approximate)  I have reviewed the triage vital signs and the nursing notes.   HISTORY  Chief Complaint Vascular Access Problem  Level 5 caveat:  history/ROS limited by acute/critical illness  HPI [name] is a 59 y.o. female with past medical history as listed below (see the patient's actual chart for more details) who presents as emergency traffic by EMS for acute bleeding from her left arm dialysis fistula.  Details are minimal but it appears that she lives alone at home and began bleeding from the fistula last night.  She tried wrapping the wound but was unsuccessful in stopping the bleeding.  It is unclear for how long she bled but the thought is that it has been at least an hour.  She was able to call 911 and EMS reported that there was a copious amount of blood at her home; they were estimating a couple of liters.   When they arrived the patient was hypotensive, pale, and lethargic.  They brought her immediately to the ED and upon arrival to our facility she is minimally responsive.       PMH: Date Unknown Anemia Date Unknown Anginal pain ([place]) Date Unknown Anxiety Date Unknown Arthritis Date Unknown Asthma Date Unknown Broken wrist Date Unknown Bronchitis Date Unknown COPD (chronic obstructive pulmonary disease) ([place]) Date Unknown Coronary artery disease Date Unknown Depression Date Unknown Diabetes mellitus without complication ([place]) Date Unknown Diabetic neuropathy ([place]) Date Unknown Diverticulosis Date Unknown Dizziness Date Unknown Dyspnea Date Unknown Elevated lipids Date Unknown Environmental and seasonal allergies Date Unknown ESRD (end stage renal disease) on dialysis ([place]) Date Unknown GERD (gastroesophageal reflux disease) Date Unknown Headache Date  Unknown History of anemia due to chronic kidney disease Date Unknown History of hiatal hernia Date Unknown HOH (hard of hearing) Date Unknown Hypertension Date Unknown Lower extremity edema Date Unknown Mitral regurgitation Date Unknown Myocardial infarction [facility]) Date Unknown Orthopnea Date Unknown Pneumonia Date Unknown Renal cancer ([place]) Date Unknown Renal insufficiency Date Unknown Wheezing  Surgical History   24 items 08/11/2019 Ir radiologist eval & mgmt 07/28/2019 Ir radiologist eval & mgmt 10/11/2018 Artery biopsy(Right) 01/20/2018 A/v shuntogram(Left) 10/02/2017 Amputation toe(Left) 03/10/2017 Cataract extraction w/phaco(Left) 2018 Eye surgery(Right) 08/12/2016 Colonoscopy with propofol(N/A) 03/18/2016 Esophagogastroduodenoscopy (egd) with propofol(N/A) 01/02/2016 Peripheral vascular catheterization(N/A) 01/02/2016 Peripheral vascular catheterization(N/A) 12/20/2015 Peripheral vascular catheterization(N/A) 12/20/2015 Peripheral vascular catheterization(N/A) 12/20/2015 Peripheral vascular catheterization(N/A) 08/23/2015 Fecal transplant(N/A) 07/26/2015 Cardiac catheterization(Left) 03/08/2015 Esophagogastroduodenoscopy(N/A) 1992 Abdominal hysterectomy Date Unknown Appendectomy Date Unknown Cataract extraction w/ intraocular lens implant(Right) Date Unknown Cholecystectomy Date Unknown Dialysis fistula creation(Left) Date Unknown dialysis grafts [Other] Date Unknown Hand surgery(Bilateral)    Prior to Admission medications   Medication Sig Start Date End Date Taking? Authorizing Provider  albuterol (VENTOLIN HFA) 108 (90 Base) MCG/ACT inhaler Inhale 2 puffs into the lungs every 4 (four) hours as needed for wheezing or shortness of breath. 11/09/19  Yes [provider]  amLODipine (NORVASC) 10 MG tablet Take 10 mg by mouth daily. 10/24/19  Yes [provider]  aspirin EC 81 MG tablet Take 81 mg by mouth daily.   Yes [provider]  atorvastatin (LIPITOR) 20 MG tablet Take 20 mg by mouth daily. 10/24/19  Yes [provider]  carvedilol (COREG) 6.25 MG tablet Take 6.25 mg by mouth 2 (two) times daily. 10/24/19  Yes [provider]  citalopram (CELEXA) 20 MG  tablet Take 20 mg by mouth daily. 10/29/19  Yes [provider]  clopidogrel (PLAVIX) 75 MG tablet Take 75 mg by mouth daily. 10/24/19  Yes [provider]  CREON 12000 units CPEP capsule Take 12,000-36,000 Units by mouth See admin instructions. Take 3 capsules (36000u) by mouth three times daily with meals and take 1 to 2 capsules (12000u-24000u) by mouth daily with snacks 10/21/19  Yes [provider]  doxycycline (VIBRAMYCIN) 100 MG capsule Take 100 mg by mouth 2 (two) times daily. 11/09/19 11/16/19 Yes [provider]  fluticasone (FLONASE) 50 MCG/ACT nasal spray Place 2 sprays into both nostrils daily. 09/28/19  Yes [provider]  furosemide (LASIX) 80 MG tablet Take 80 mg by mouth daily. 10/15/19  Yes [provider]  gabapentin (NEURONTIN) 100 MG capsule Take 200 mg by mouth at bedtime.  10/29/19  Yes [provider]  HYDROcodone-acetaminophen (NORCO/VICODIN) 5-325 MG tablet Take 1 tablet by mouth 4 (four) times daily as needed for pain. 10/20/19  Yes [provider]  meclizine (ANTIVERT) 25 MG tablet Take 25 mg by mouth every 6 (six) hours as needed for dizziness. 11/01/19  Yes [provider]  mupirocin ointment (BACTROBAN) 2 % Apply 1 application topically 3 (three) times daily. 11/09/19  Yes [provider]  pantoprazole (PROTONIX) 40 MG tablet Take 40 mg by mouth daily. 08/28/19  Yes [provider]    Allergies Patient has no allergy information on record.  No family history on file.  Social History Social History   Tobacco Use  . Smoking status: Not on file  Substance Use Topics  . Alcohol use: Not on file  . Drug use: Not on file     Review of Systems Level 5 caveat:  history/ROS limited by acute/critical illness   ____________________________________________   PHYSICAL EXAM:  VITAL SIGNS: ED Triage Vitals  Enc Vitals Group     BP 11/11/19 0600 (!) 56/35     Pulse Rate 11/11/19 0558 65     Resp 11/11/19 0558 16     Temp --      Temp src --      SpO2 11/11/19 0555 99 %     Weight --      Height 11/11/19 0559 1.6 m (5\' 3" )     Head Circumference --      Peak Flow --      Pain Score --      Pain Loc --      Pain Edu? --      Excl. in [place]? --     Constitutional: Awake but obtunded.  Critically ill appearance. Eyes: Conjunctivae are normal.  Head: Atraumatic. Nose: No congestion/rhinnorhea. Neck: No stridor.  No meningeal signs.   Cardiovascular: Initial heart rate was normal, then became bradycardic, regular rhythm.  Poor peripheral circulation. Grossly normal heart sounds.  Patient has a dialysis fistula in the left arm that has dried blood and clots on it and is wrapped with gauze but is not actively bleeding at this time.  Left radial pulse is faintly palpated if at all.  Cannot palpate femoral pulse.  However the distal extremities are warm and feel perfused. Respiratory: Normal respiratory effort.  No retractions. Gastrointestinal: Obese.  Soft and nontender. No distention.  Musculoskeletal: No lower extremity tenderness nor edema. No gross deformities of extremities except for the dialysis fistula in the left arm. Neurologic: Slurred speech and language. No gross focal neurologic deficits are appreciated.  Skin:  Skin  is warm, clammy, and except for the wound currently covered by gauze on the left arm.  ____________________________________________   LABS (all labs ordered are listed, but only abnormal results are displayed)  Labs Reviewed  CBC WITH DIFFERENTIAL/PLATELET - Abnormal; Notable for the following components:      Result Value   RBC 1.96 (*)    Hemoglobin 5.5 (*)    HCT 17.6 (*)     RDW 17.4 (*)    All other components within normal limits  COMPREHENSIVE METABOLIC PANEL - Abnormal; Notable for the following components:   Sodium 132 (*)    Chloride 97 (*)    Glucose, Bld 151 (*)    BUN 27 (*)    Creatinine, Ser 7.24 (*)    Calcium 7.0 (*)    Total Protein 4.4 (*)    Albumin 2.2 (*)    AST 13 (*)    GFR calc non Af Amer 6 (*)    GFR calc Af Amer 6 (*)    All other components within normal limits  PROTIME-INR - Abnormal; Notable for the following components:   Prothrombin Time 17.2 (*)    INR 1.4 (*)    All other components within normal limits  APTT - Abnormal; Notable for the following components:   aPTT 42 (*)    All other components within normal limits  RESPIRATORY PANEL BY RT PCR (FLU A&B, COVID)  HIV ANTIBODY (ROUTINE TESTING W REFLEX)  CBC WITH DIFFERENTIAL/PLATELET  CBC WITH DIFFERENTIAL/PLATELET  CBC WITH DIFFERENTIAL/PLATELET  TYPE AND SCREEN  PREPARE RBC (CROSSMATCH)  PREPARE RBC (CROSSMATCH)  ABO/RH   ____________________________________________  EKG  ED ECG REPORT I, Hinda Kehr, the attending physician, personally viewed and interpreted this ECG.  Date: 11/11/2019 EKG Time: 6:20 AM Rate: 50 Rhythm: Sinus bradycardia QRS Axis: normal Intervals: normal ST/T Wave abnormalities: No ST segment abnormalities noted. Narrative Interpretation: no definitive evidence of acute ischemia; does not meet STEMI criteria.  No peaked T waves.   ____________________________________________  RADIOLOGY Ursula Alert, personally viewed and evaluated these images (plain radiographs) as part of my medical decision making, as well as reviewing the written report by the radiologist.  ED MD interpretation: No indication for emergent imaging  Official radiology report(s): No results found.  ____________________________________________   PROCEDURES   Procedure(s) performed (including Critical Care):  .Critical Care Performed by: Hinda Kehr, MD Authorized by: Hinda Kehr, MD   Critical care provider statement:    Critical care time (minutes):  90   Critical care time was exclusive of:  Separately billable procedures and treating other patients   Critical care was necessary to treat or prevent imminent or life-threatening deterioration of the following conditions:  Shock and circulatory failure   Critical care was time spent personally by me on the following activities:  Development of treatment plan with patient or surrogate, discussions with consultants, evaluation of patient's response to treatment, examination of patient, obtaining history from patient or surrogate, ordering and performing treatments and interventions, ordering and review of laboratory studies, ordering and review of radiographic studies, pulse oximetry, re-evaluation of patient's condition and review of old charts     ____________________________________________   [place] / MDM / [place] / ED COURSE  As part of my medical decision making, I reviewed the following data within the electronic MEDICAL RECORD NUMBER History obtained from family, Nursing notes reviewed and incorporated, Labs reviewed , EKG interpreted , Old chart reviewed, Discussed with admitting physician , A  consult was requested and obtained from this/these consultant(s) Vascular surgery and Notes from prior ED visits   Differential diagnosis includes, but is not limited to, hemorrhagic shock, sepsis or acute infection, metabolic or electrolyte abnormality.  The patient typically gets dialysis on Mondays, Wednesdays, and Fridays, which means that she was due for dialysis this morning.  However she is in no respiratory distress.  Upon arrival to the ED she had a blood pressure of about 50/30 and was lethargic.  I spent an extended period of time attempting to resuscitate her.  I authorized 2 units of emergency release blood given her obvious hemorrhagic or hypovolemic  shock.  I also authorized normal saline fluid bolus while we were awaiting the blood.  She was placed in Trendelenburg position.  EJ was placed as well as a peripheral IV and both sites were used administer blood to resuscitate her.  She had an episode where she became less responsive and increasingly bradycardic.  Pads were placed anteriorly and posteriorly and she was monitored on the [place], but she never went apneic nor unresponsive and never coded.  I ordered atropine 0.5 mg IV which somewhat improved her heart rate to about 60 for a period of time.  I also ordered an ampule of sodium bicarbonate given her dialysis plans and that I did not know at the time whether or not she was hyperkalemic even though her EKG was not consistent with hyperkalemia.  Her mentation improved after her blood pressure improved and by approximately 7 AM she had a blood pressure of about 120/70.  At that point I called Dr. Delana Meyer with vascular surgery and let him know about the situation.  He is notifying the operating room and once she is relatively resuscitated and stable he will take her to the OR.  He also asked that I contact the ICU for admission.  Of note, during the time that she appeared most critically ill, I attempted to place a right femoral triple-lumen central line, but experienced a failure of the guidewire during the placement after introducing the needle into the femoral vein.  After an aborted attempt we placed pressure on the femoral wound for about 5 minutes and no bleeding or hematoma is observed.  By this time the patient was mentating well and no additional central access was needed nor attempted.      Clinical Course as of Nov 10 798  Fri Nov 11, 2019  0656 Awaiting callback from ICU provider   [CF]  [phone] Documentation delayed due to critical patient care: I spoke with Marda Stalker from the ICU who will admit the patient and discussed the patient with Dr. Patsey Berthold.  I then spoke with Dr. Delana Meyer  with vascular surgery who has already spoken with the operating room and is on the way to the hospital to take her to the OR.  The patient is currently hemodynamically stable, still bradycardic but with a blood pressure of about 120/50.   [CF]  [phone] The nurse informed me about 15 minutes ago that the patient was bleeding again from her dialysis fistula now that she is volume resuscitated.  I took down the dressing that was in place and there was a long steady arc of blood shooting from a small wound that appears most consistent with a puncture wound.  I placed a dialysis clamp over the site and observed it for a couple of minutes and there was no additional bleeding or swelling from around the site.  This was  the recommended intervention by Dr. Delana Meyer when I spoke with him by phone previously.   [CF]    Clinical Course User Index [CF] Hinda Kehr, MD     ____________________________________________  FINAL CLINICAL IMPRESSION(S) / ED DIAGNOSES  Final diagnoses:  Complication of arteriovenous dialysis fistula, initial encounter  Hemorrhagic shock ([place])  ESRD on hemodialysis ([place])     MEDICATIONS GIVEN DURING THIS VISIT:  Medications  ondansetron (ZOFRAN) injection 4 mg (has no administration in time range)  0.9 %  sodium chloride infusion (Manually program via Guardrails IV Fluids) (has no administration in time range)  ceFAZolin (ANCEF) IVPB 1 g/50 mL premix (has no administration in time range)     ED Discharge Orders    None      *Please note:  [name] was evaluated in Emergency Department on 11/11/2019 for the symptoms described in the history of present illness. She was evaluated in the context of the global COVID-19 pandemic, which necessitated consideration that the patient might be at risk for infection with the SARS-CoV-2 virus that causes COVID-19. Institutional protocols and algorithms that pertain to the evaluation of patients at risk for COVID-19 are in a state of  rapid change based on information released by regulatory bodies including the CDC and federal and state organizations. These policies and algorithms were followed during the patient's care in the ED.  Some ED evaluations and interventions may be delayed as a result of limited staffing during the pandemic.*  Note:  This document was prepared using Dragon voice recognition software and may include unintentional dictation errors.   Hinda Kehr, MD 11/11/19 0800

## 2019-11-11 NOTE — Progress Notes (Signed)
*  PRELIMINARY RESULTS* Echocardiogram 2D Echocardiogram has been performed.  [name] [place] 11/11/2019, 8:13 PM

## 2019-11-11 NOTE — ED Notes (Signed)
First unit emergency blood transfusing as of [phone]

## 2019-11-11 NOTE — ED Triage Notes (Signed)
Patient arrives via EMS for bleeding from dialysis access. Patient has L upper arm fistula that is currently pressure wrapped, but excessive blood loss is evident. Patient is pale and lethargic but able to answer some questions, says she had an infection in her dialysis cath earlier this month. Initial BP by EMS was 72/58, responds to pain

## 2019-11-11 NOTE — Anesthesia Procedure Notes (Signed)
Procedure Name: Intubation Date/Time: 11/11/2019 9:00 AM Performed by: Nelda Marseille, CRNA Pre-anesthesia Checklist: Patient identified, Patient being monitored, Timeout performed, Emergency Drugs available and Suction available Patient Re-evaluated:Patient Re-evaluated prior to induction Oxygen Delivery Method: Circle system utilized Preoxygenation: Pre-oxygenation with 100% oxygen Induction Type: IV induction Ventilation: Mask ventilation without difficulty Laryngoscope Size: Mac, 3 and McGraph Grade View: Grade II Tube type: Oral Tube size: 7.0 mm Number of attempts: 1 Airway Equipment and Method: Stylet and Video-laryngoscopy Placement Confirmation: ETT inserted through vocal cords under direct vision,  positive ETCO2 and breath sounds checked- equal and bilateral Secured at: 21 cm Tube secured with: Tape Dental Injury: Teeth and Oropharynx as per pre-operative assessment

## 2019-11-11 NOTE — ED Notes (Addendum)
0632 pt and this RN speaking with patient's son, this RN advised him to come in and gave him update  [phone] Patient became increasingly somnolent and dropped HR to 48, periods of apnea. EDP notified  [phone] MD at bedside, 0.5mg  atropine given with HR increase to 65  0635 HR 63, 102/53  0636 HR 54 117/51  0639 first emergent blood unit finished  0642 MD attempting to place femoral CVC  [phone] second emergent unit finished

## 2019-11-11 NOTE — Op Note (Signed)
OPERATIVE NOTE   PROCEDURE: 1.  Insertion of triple-lumen catheter left femoral vein with ultrasound guidance. 2.  Excision of infected left arm brachial axillary AV graft  PRE-OPERATIVE DIAGNOSIS: Hemorrhagic shock; ulceration with localized infection left arm brachial axillary AV graft; end-stage renal disease on hemodialysis  POST-OPERATIVE DIAGNOSIS: Same  SURGEON: Hortencia Pilar  ASSISTANT(S): Ms. Hezzie Bump  ANESTHESIA: general  ESTIMATED BLOOD LOSS: 200 cc  FINDING(S): Ulceration within the graft with free bleeding from the AV graft.  SPECIMEN(S): Resected graft as well as intravascular stent  INDICATIONS:   [name] is a 59 y.o. female who presents with hemorrhagic shock with a blood pressure of 50 over palp.  Bleeding from her AV graft is identified.  This is controlled with a tourniquet and then a clamp.  She is emergently taken to the operating room for resection of her graft for control of hemorrhage.  DESCRIPTION: After full informed written consent was obtained from the patient, the patient was brought back to the operating room and placed supine upon the operating table.  Prior to induction, the patient received IV antibiotics.   After obtaining adequate anesthesia, the patient was then prepped and draped in the standard fashion for a right femoral triple-lumen catheter.  Ultrasound is placed in a sterile sleeve ultrasound was utilized to identify the common femoral vein which is echolucent and compressible indicating patency images recorded for the permanent record.  Seldinger needle was inserted under direct ultrasound visualization.  J-wire was advanced without difficulty.  Dilators passed over the wire and the triple-lumen catheter is threaded.  All 3 lm aspirated flushed easily.  Catheter secured to the skin of the thigh with 2-0 silk.  Biopatch and sterile dressing is applied.  Attention was then turned to the left forearm which is prepped and draped  in a sterile fashion.  Control of the hemorrhages maintained with direct pressure.  Initially an 0 Ethibond is used in a figure-of-eight fashion to ligate the ulcerated area and control the hemorrhage.  Attention was then turned to the arterial anastomosis where the previous incisional scar was reopened with a 15 blade scalpel and dissection is carried down to expose the graft.  Graft is well incorporated.  The arterial anastomosis dissected out circumferentially.  The graft is then dissected upwards away from the actual anastomosis for a distance of approximately 5 cm.  It is ligated with an 0 Ethibond at this level.  The graft is then clamped at the arterial anastomosis with a profunda clamp.  A segment of the PTFE is then resected and the tied off and is pushed up into the wound.  A pursestring suture of 3-0 Vicryl was then used to seal this.  A small sliver of PTFE is left and a CV 6 Gore suture is used to close this in a horizontal mattress followed by an over under.  Clamp was removed and flow was reestablished to the hand.  Wound is then irrigated fibrillar was placed along the artery and the wound is then closed in 2 layers.  Skin is left open at this time.  Attention was then turned to the axillary incision which is carried down through the soft tissues again exposing the graft as a courses deep toward the axillary vein.  Following the graft down the actual graft venous anastomosis is clearly identified.  A previously placed intravascular stent is also now palpable within the vein.  This necessitates dissecting the axillary vein more proximally for a distance  of approximately 4 cm toward the clavicle.  At this point 0 Ethibond is used to create a pursestring suture around the proximal axillary vein.  Distal to the graft venous anastomosis a 2-0 silk ties used to ligate the distal vein which appears sclerotic.  The graft within the axillary area again is well incorporated and adherent to the surrounding  tissues.  Given this finding the graft is taken down off the vein.  It is dissected back for a distance of 5 or so centimeters at which point 0 Ethibond is used to ligate the graft.  This segment is then passed off the field as specimen.  The stump of the graft is then oversewn in 2 layers with 3-0 Vicryl isolating it from the axillary incision.  The stent is then grasped and using blunt dissection developing a plane between the stent and the vein the stent is removed intact.  The pursestring suture was then used to ligate the proximal axillary vein.  The wound was then irrigated bleeding controlled Bovie cautery and 3-0 Vicryl suture ligatures.  It is then closed in 2 layers leaving the skin open.  Elliptical incision is then made around the graft itself in the mid arm.  This incorporates the ulcerated area.  The graft is identified at the arterial and it is grasped with a Claiborne Billings and then dissected free.  The previously noted Ethibond suture is readily identified ensuring that the entire graft has been removed.  Circumferential dissection is then created using both Metzenbaum scissors as well as Bovie cautery.  Working our way to the upper arm more proximally again we are able to remove the graft identifying the Ethibond suture ensuring that the entire graft has been intact.  Hemostasis is then obtained using Bovie cautery and 3-0 Vicryl.  The wound is irrigated fibrillar and Vistaseal are placed and then the wound is reapproximated in the deeper layer leaving the skin open.  For this incision a VAC is now placed.  For the 2 earlier incisions the skin is closed with 4-0 Monocryl subcuticular and Dermabond.   The patient tolerated this procedure well and remained hemodynamically stable throughout the entire surgery.  She is taken to the intensive care unit intubated..   COMPLICATIONS: None  CONDITION: [name] [facility]  Office: [phone]   11/11/2019, 11:36  AM

## 2019-11-11 NOTE — Consult Note (Signed)
Cardiology Consultation:   Patient ID: [name] MRN: [id number]; DOB: 11/03/1960  Admit date: 11/11/2019 Date of Consult: 11/11/2019  Primary Care Provider: Freddy Finner, NP Primary Cardiologist: new to [facility] Physician requesting consult: Dr. Patsey Berthold Reason for consult: Bradycardia   Patient Profile:   [name] is a 59 y.o. female with a hx of end-stage renal disease, on dialysis, PAD.  Presenting with uncontrollable bleeding from left upper extremity hero graft.  History of Present Illness:   [name] presented via EMS November 11, 2019 for bleeding from dialysis access, left upper arm fistula and pressure wrap on arrival excessive blood loss noted She was pale, lethargic, blood pressure 72/58 Note indicating  infection of dialysis catheter , presenting to the emergency room 2 days ago Case discussed with nephrology, pus and abnormal appearing graft   This admission, in the setting of profound anemia, hemorrhagic shock, She was given IV fluid bolus Emergency blood ordered Transfusion 6 AM  630 patient increasingly somnolent, heart rate down to 48 with periods of apnea Given atropine heart rate up to 65 Note indicating she is full code  Seen by vascular, plan to place a PermCath given graft not accessible She was intubated this morning for airway protection  Plan to take to the emergency room for ligation and attempt to control the bleeding   Operating notes reviewed by Dr. Delana Meyer Excision of infected left arm brachial axillary AV graft  She is back in the ICU, intubated, Heart rate in the 40s, blood pressure 409 up to 811 systolic     Heart Pathway Score:     No past medical history on file.  Past medical history Diabetes type 2 PAD, carotid stenosis On hemodialysis with end-stage renal disease   No current facility-administered medications on file prior to encounter.   Current Outpatient Medications on File Prior to Encounter  Medication Sig Dispense  Refill  . albuterol (VENTOLIN HFA) 108 (90 Base) MCG/ACT inhaler Inhale 2 puffs into the lungs every 4 (four) hours as needed for wheezing or shortness of breath.    Marland Kitchen amLODipine (NORVASC) 10 MG tablet Take 10 mg by mouth daily.    Marland Kitchen aspirin EC 81 MG tablet Take 81 mg by mouth daily.    Marland Kitchen atorvastatin (LIPITOR) 20 MG tablet Take 20 mg by mouth daily.    . carvedilol (COREG) 6.25 MG tablet Take 6.25 mg by mouth 2 (two) times daily.    . citalopram (CELEXA) 20 MG tablet Take 20 mg by mouth daily.    . clopidogrel (PLAVIX) 75 MG tablet Take 75 mg by mouth daily.    Marland Kitchen CREON 12000 units CPEP capsule Take 12,000-36,000 Units by mouth See admin instructions. Take 3 capsules (36000u) by mouth three times daily with meals and take 1 to 2 capsules (12000u-24000u) by mouth daily with snacks    . doxycycline (VIBRAMYCIN) 100 MG capsule Take 100 mg by mouth 2 (two) times daily.    . fluticasone (FLONASE) 50 MCG/ACT nasal spray Place 2 sprays into both nostrils daily.    . furosemide (LASIX) 80 MG tablet Take 80 mg by mouth daily.    Marland Kitchen gabapentin (NEURONTIN) 100 MG capsule Take 200 mg by mouth at bedtime.     Marland Kitchen HYDROcodone-acetaminophen (NORCO/VICODIN) 5-325 MG tablet Take 1 tablet by mouth 4 (four) times daily as needed for pain.    . meclizine (ANTIVERT) 25 MG tablet Take 25 mg by mouth every 6 (six) hours as needed for dizziness.    Marland Kitchen  mupirocin ointment (BACTROBAN) 2 % Apply 1 application topically 3 (three) times daily.    . pantoprazole (PROTONIX) 40 MG tablet Take 40 mg by mouth daily.     Inpatient Medications: Scheduled Meds: . sodium chloride   Intravenous Once   Continuous Infusions: . [START ON 11/12/2019]  ceFAZolin (ANCEF) IV     PRN Meds: ondansetron (ZOFRAN) IV  Allergies:    Allergies  Allergen Reactions  . Dilaudid [Hydromorphone]   . Ondansetron     Social History:   Social History   Socioeconomic History  . Marital status: Divorced    Spouse name: Not on file  . Number of  children: Not on file  . Years of education: Not on file  . Highest education level: Not on file  Occupational History  . Not on file  Tobacco Use  . Smoking status: Not on file  Substance and Sexual Activity  . Alcohol use: Not on file  . Drug use: Not on file  . Sexual activity: Not on file  Other Topics Concern  . Not on file  Social History Narrative  . Not on file   Social Determinants of Health   Financial Resource Strain:   . Difficulty of Paying Living Expenses: Not on file  Food Insecurity:   . Worried About Charity fundraiser in the Last Year: Not on file  . Ran Out of Food in the Last Year: Not on file  Transportation Needs:   . Lack of Transportation (Medical): Not on file  . Lack of Transportation (Non-Medical): Not on file  Physical Activity:   . Days of Exercise per Week: Not on file  . Minutes of Exercise per Session: Not on file  Stress:   . Feeling of Stress : Not on file  Social Connections:   . Frequency of Communication with Friends and Family: Not on file  . Frequency of Social Gatherings with Friends and Family: Not on file  . Attends Religious Services: Not on file  . Active Member of Clubs or Organizations: Not on file  . Attends Archivist Meetings: Not on file  . Marital Status: Not on file  Intimate Partner Violence:   . Fear of Current or Ex-Partner: Not on file  . Emotionally Abused: Not on file  . Physically Abused: Not on file  . Sexually Abused: Not on file    Family History:   *No family history on file.   ROS:  Please see the history of present illness.  Unable to provide review of systems as she is intubated and sedated  Physical Exam/Data:   Vitals:   11/11/19 0730 11/11/19 0808 11/11/19 1056 11/11/19 1100  BP: (!) 126/55 136/64 (!) 156/77   Pulse: (!) 47 (!) 43    Resp: 10 (!) 23    Temp:  (!) 96.2 F (35.7 C)    SpO2: 100% 100%  100%  Weight:  88.1 kg    Height:  5\' 3"  (1.6 m)      Intake/Output Summary  (Last 24 hours) at 11/11/2019 1126 Last data filed at 11/11/2019 1057 Gross per 24 hour  Intake 3171 ml  Output 75 ml  Net 3096 ml   Last 3 Weights 11/11/2019  Weight (lbs) 194 lb 3.6 oz  Weight (kg) 88.1 kg     Body mass index is 34.41 kg/m.  General:  Well nourished, well developed, intubated sedated, unresponsive HEENT: normal Lymph: no adenopathy Neck: no JVD Endocrine:  No thryomegaly Vascular:  No carotid bruits; FA pulses 2+ bilaterally without bruits  Cardiac: Bradycardic, normal S1, S2; RRR; no murmur  Lungs:  clear to auscultation bilaterally, no wheezing, rhonchi or rales  Abd: soft, nontender, no hepatomegaly  Ext: no edema Musculoskeletal:  No deformities, unable to test Skin: warm and dry  Neuro: Unable to test Psych: Sedated  EKG:  The EKG was personally reviewed and demonstrates:   Sinus bradycardia rate 50 bpm no significant ST-T wave changes Time of study 6: 20 a.m.  Telemetry:  Telemetry was personally reviewed and demonstrates:   Sinus bradycardia rate in the 40s  Relevant CV Studies: Echocardiogram pending this admission  Echocardiogram January 2020 Left ventricle: The cavity size was normal. Systolic function was  normal. The estimated ejection fraction was in the range of 60%  to 65%.   Laboratory Data:  High Sensitivity Troponin:  No results for input(s): TROPONINIHS in the last 720 hours.   Chemistry Recent Labs  Lab 11/11/19 0612  NA 132*  K 4.2  CL 97*  CO2 23  GLUCOSE 151*  BUN 27*  CREATININE 7.24*  CALCIUM 7.0*  GFRNONAA 6*  GFRAA 6*  ANIONGAP 12    Recent Labs  Lab 11/11/19 0612  PROT 4.4*  ALBUMIN 2.2*  AST 13*  ALT 9  ALKPHOS 49  BILITOT 0.8   Hematology Recent Labs  Lab 11/11/19 0612 11/11/19 0820  WBC 4.6 6.6  RBC 1.96* 3.21*  HGB 5.5* 9.3*  HCT 17.6* 28.2*  MCV 89.8 87.9  MCH 28.1 29.0  MCHC 31.3 33.0  RDW 17.4* 15.5  PLT 161 131*   BNPNo results for input(s): BNP, PROBNP in the last 168 hours.    DDimer No results for input(s): DDIMER in the last 168 hours.   Radiology/Studies:  No results found. {  Assessment and Plan:    1.  Hemorrhagic shock Bleeding from AV fistula left arm, taken to the OR, Excision of infected left arm brachial axillary AV graft Profound anemia on arrival with hypotension and shock requiring fluid and transfusion resuscitation --Most recent hemoglobin 9.3, up from 5.5 --Plan for temporary dialysis catheter for now  2.  Bradycardia No evidence of prior bradycardia history Rate in the 50s on arrival, now in the 40s intubated sedated but maintaining adequate blood pressure --For further hemodynamic instability could start on dopamine infusion -It does not appear to be beta-blocker on her outpatient medication list -Echocardiogram pending -Possible that rate may improve after extubation We will continue to monitor  3.  End-stage renal disease on hemodialysis Case discussed with nephrology We will need new access, vascular aware  4. + PAD, carotid stenosis Right Carotid: Velocities in the right ICA are consistent with a 80-99% stenosis.  Left Carotid: Velocities in the left ICA are consistent with a 40-59%  stenosis.  Followed by Dr. Delana Meyer --Once clinically stable, aspirin statin  5.  Infected AV graft Seen in emergency room November 09, 2019 Noted to have some purulence, pain, difficulty accessing site Surgery today as detailed above   Total encounter time more than 110 minutes  Greater than 50% was spent in counseling and coordination of care with the patient   For questions or updates, please contact [facility] Please consult [email or web address] for contact info under     Signed, Ida Rogue, MD  11/11/2019 11:26 AM

## 2019-11-12 DIAGNOSIS — N186 End stage renal disease: Secondary | ICD-10-CM

## 2019-11-12 DIAGNOSIS — Z992 Dependence on renal dialysis: Secondary | ICD-10-CM

## 2019-11-12 LAB — ECHOCARDIOGRAM COMPLETE
Height: 63 in
Weight: 3107.6 oz

## 2019-11-12 LAB — CBC WITH DIFFERENTIAL/PLATELET
Abs Immature Granulocytes: 0.03 10*3/uL (ref 0.00–0.07)
Abs Immature Granulocytes: 0.02 10*3/uL (ref 0.00–0.07)
Abs Immature Granulocytes: 0.02 10*3/uL (ref 0.00–0.07)
Basophils Absolute: 0 10*3/uL (ref 0.0–0.1)
Basophils Absolute: 0 10*3/uL (ref 0.0–0.1)
Basophils Absolute: 0 10*3/uL (ref 0.0–0.1)
Basophils Relative: 0 %
Basophils Relative: 0 %
Basophils Relative: 0 %
Eosinophils Absolute: 0.3 10*3/uL (ref 0.0–0.5)
Eosinophils Absolute: 0.4 10*3/uL (ref 0.0–0.5)
Eosinophils Absolute: 0.2 10*3/uL (ref 0.0–0.5)
Eosinophils Relative: 6 %
Eosinophils Relative: 7 %
Eosinophils Relative: 5 %
HCT: 22.3 % — ABNORMAL LOW (ref 36.0–46.0)
HCT: 21.2 % — ABNORMAL LOW (ref 36.0–46.0)
HCT: 20.5 % — ABNORMAL LOW (ref 36.0–46.0)
Hemoglobin: 7.6 g/dL — ABNORMAL LOW (ref 12.0–15.0)
Hemoglobin: 7 g/dL — ABNORMAL LOW (ref 12.0–15.0)
Hemoglobin: 6.9 g/dL — ABNORMAL LOW (ref 12.0–15.0)
Immature Granulocytes: 1 %
Immature Granulocytes: 0 %
Immature Granulocytes: 0 %
Lymphocytes Relative: 18 %
Lymphocytes Relative: 21 %
Lymphocytes Relative: 21 %
Lymphs Abs: 1.1 10*3/uL (ref 0.7–4.0)
Lymphs Abs: 1.3 10*3/uL (ref 0.7–4.0)
Lymphs Abs: 1 10*3/uL (ref 0.7–4.0)
MCH: 27.6 pg (ref 26.0–34.0)
MCH: 27.5 pg (ref 26.0–34.0)
MCH: 27.5 pg (ref 26.0–34.0)
MCHC: 34.1 g/dL (ref 30.0–36.0)
MCHC: 33 g/dL (ref 30.0–36.0)
MCHC: 33.7 g/dL (ref 30.0–36.0)
MCV: 81.1 fL (ref 80.0–100.0)
MCV: 83.1 fL (ref 80.0–100.0)
MCV: 81.7 fL (ref 80.0–100.0)
Monocytes Absolute: 0.5 10*3/uL (ref 0.1–1.0)
Monocytes Absolute: 0.5 10*3/uL (ref 0.1–1.0)
Monocytes Absolute: 0.2 10*3/uL (ref 0.1–1.0)
Monocytes Relative: 8 %
Monocytes Relative: 9 %
Monocytes Relative: 4 %
Neutro Abs: 4 10*3/uL (ref 1.7–7.7)
Neutro Abs: 3.7 10*3/uL (ref 1.7–7.7)
Neutro Abs: 3.2 10*3/uL (ref 1.7–7.7)
Neutrophils Relative %: 67 %
Neutrophils Relative %: 63 %
Neutrophils Relative %: 70 %
Platelets: 105 10*3/uL — ABNORMAL LOW (ref 150–400)
Platelets: 103 10*3/uL — ABNORMAL LOW (ref 150–400)
Platelets: 97 10*3/uL — ABNORMAL LOW (ref 150–400)
RBC: 2.75 MIL/uL — ABNORMAL LOW (ref 3.87–5.11)
RBC: 2.55 MIL/uL — ABNORMAL LOW (ref 3.87–5.11)
RBC: 2.51 MIL/uL — ABNORMAL LOW (ref 3.87–5.11)
RDW: 18.6 % — ABNORMAL HIGH (ref 11.5–15.5)
RDW: 18.9 % — ABNORMAL HIGH (ref 11.5–15.5)
RDW: 18.6 % — ABNORMAL HIGH (ref 11.5–15.5)
WBC: 5.9 10*3/uL (ref 4.0–10.5)
WBC: 5.9 10*3/uL (ref 4.0–10.5)
WBC: 4.6 10*3/uL (ref 4.0–10.5)
nRBC: 0 % (ref 0.0–0.2)
nRBC: 0 % (ref 0.0–0.2)
nRBC: 0 % (ref 0.0–0.2)

## 2019-11-12 LAB — BASIC METABOLIC PANEL
Anion gap: 10 (ref 5–15)
BUN: 31 mg/dL — ABNORMAL HIGH (ref 6–20)
CO2: 24 mmol/L (ref 22–32)
Calcium: 7.2 mg/dL — ABNORMAL LOW (ref 8.9–10.3)
Chloride: 101 mmol/L (ref 98–111)
Creatinine, Ser: 7.71 mg/dL — ABNORMAL HIGH (ref 0.44–1.00)
GFR calc Af Amer: 6 mL/min — ABNORMAL LOW (ref >60)
GFR calc non Af Amer: 5 mL/min — ABNORMAL LOW (ref >60)
Glucose, Bld: 58 mg/dL — ABNORMAL LOW (ref 70–99)
Potassium: 4.3 mmol/L (ref 3.5–5.1)
Sodium: 135 mmol/L (ref 135–145)

## 2019-11-12 LAB — BPAM FFP
Blood Product Expiration Date: 202102242359
ISSUE DATE / TIME: 202102191024
Unit Type and Rh: 5100

## 2019-11-12 LAB — PREPARE FRESH FROZEN PLASMA: Unit division: 0

## 2019-11-12 LAB — PROTIME-INR
INR: 1.4 — ABNORMAL HIGH (ref 0.8–1.2)
Prothrombin Time: 17.1 seconds — ABNORMAL HIGH (ref 11.4–15.2)

## 2019-11-12 LAB — MAGNESIUM: Magnesium: 2 mg/dL (ref 1.7–2.4)

## 2019-11-12 LAB — GLUCOSE, CAPILLARY
Glucose-Capillary: 115 mg/dL — ABNORMAL HIGH (ref 70–99)
Glucose-Capillary: 35 mg/dL — CL (ref 70–99)
Glucose-Capillary: 64 mg/dL — ABNORMAL LOW (ref 70–99)
Glucose-Capillary: 72 mg/dL (ref 70–99)
Glucose-Capillary: 79 mg/dL (ref 70–99)
Glucose-Capillary: 80 mg/dL (ref 70–99)
Glucose-Capillary: 86 mg/dL (ref 70–99)

## 2019-11-12 LAB — PHOSPHORUS: Phosphorus: 5.2 mg/dL — ABNORMAL HIGH (ref 2.5–4.6)

## 2019-11-12 IMAGING — US US RENAL
1 series · 13 of 25 positions shown · non-contrast
Comparison: CT 10/02/2018, 03/02/2018.  Ultrasound 10/23/2017

CLINICAL DATA: Renal mass.

EXAM:
RENAL / URINARY TRACT ULTRASOUND COMPLETE

[Series 1: us renal · 0.26mm/px · 13 of 31 slices shown]
[im 1/31]
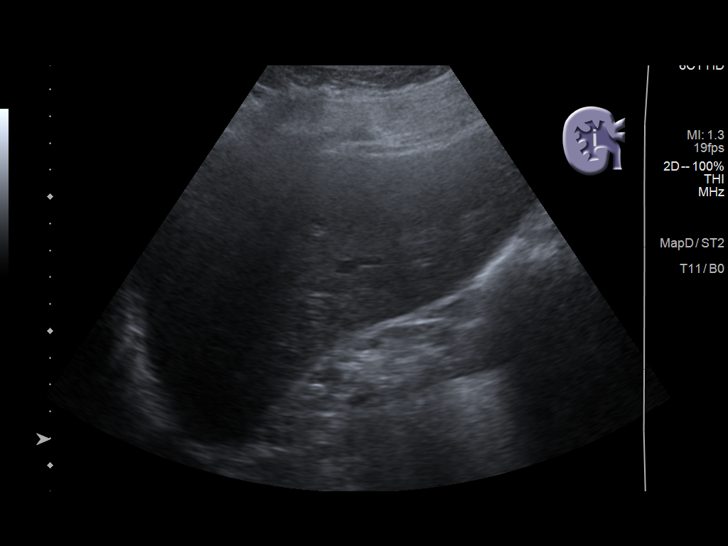
[im 3/31]
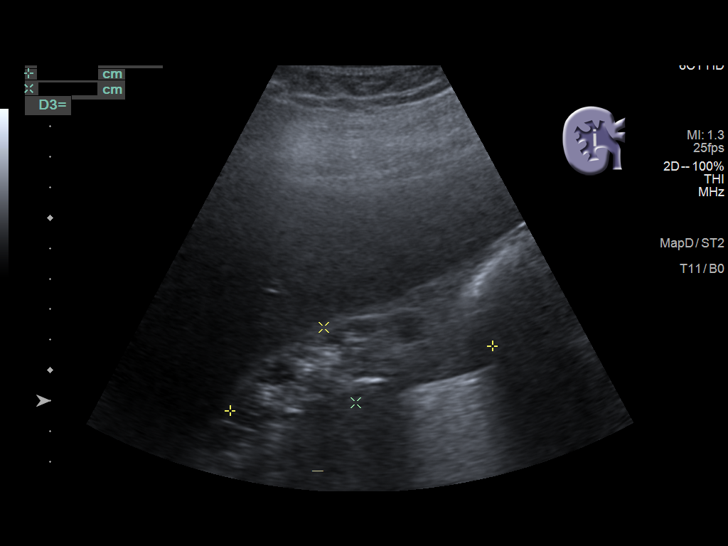
[im 6/31]
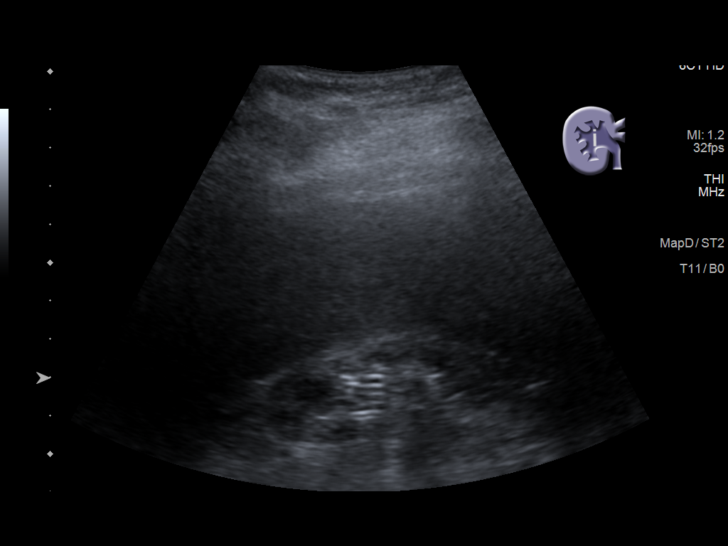
[im 8/31]
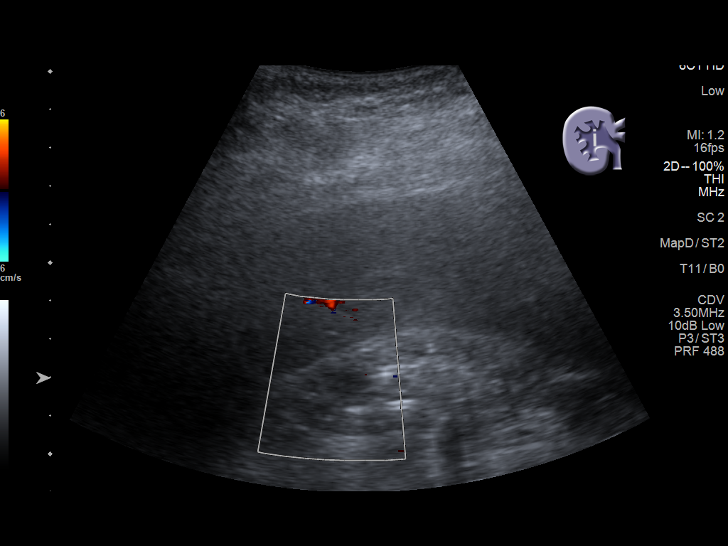
[im 11/31]
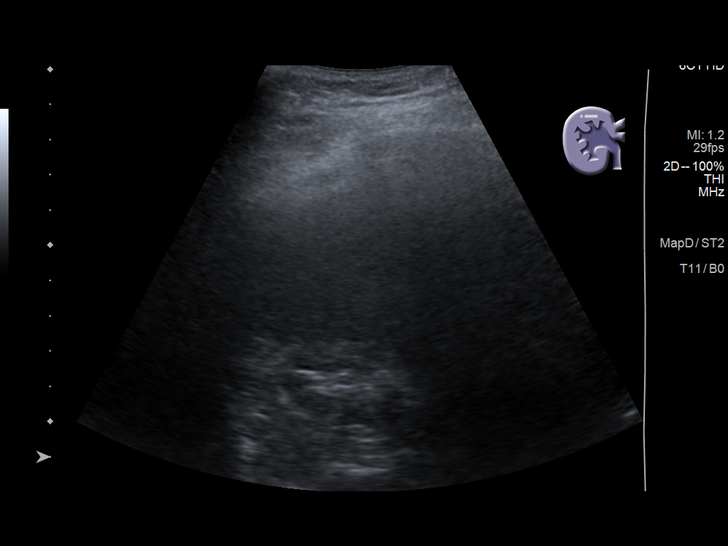
[im 13/31]
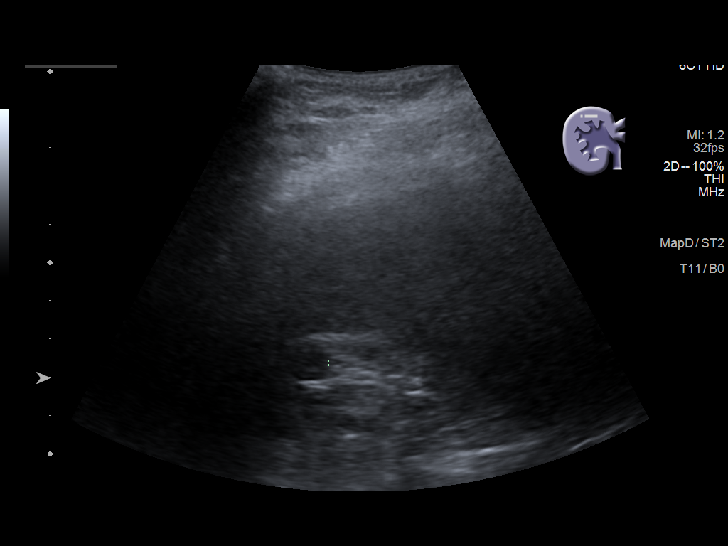
[im 16/31]
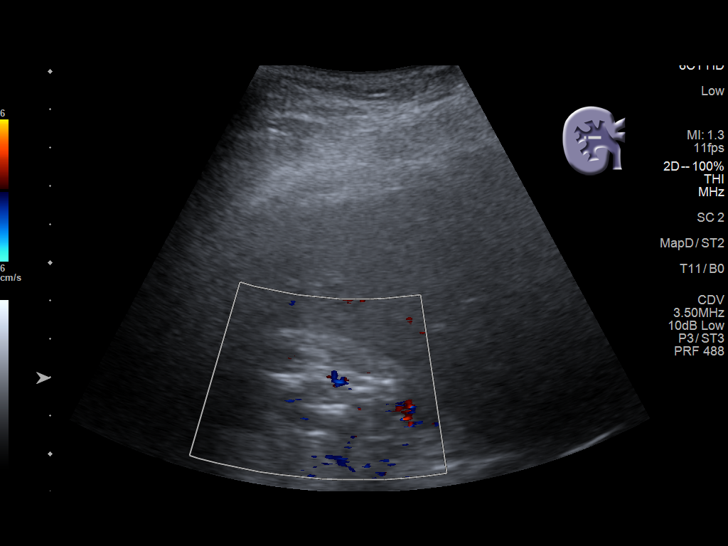
[im 18/31]
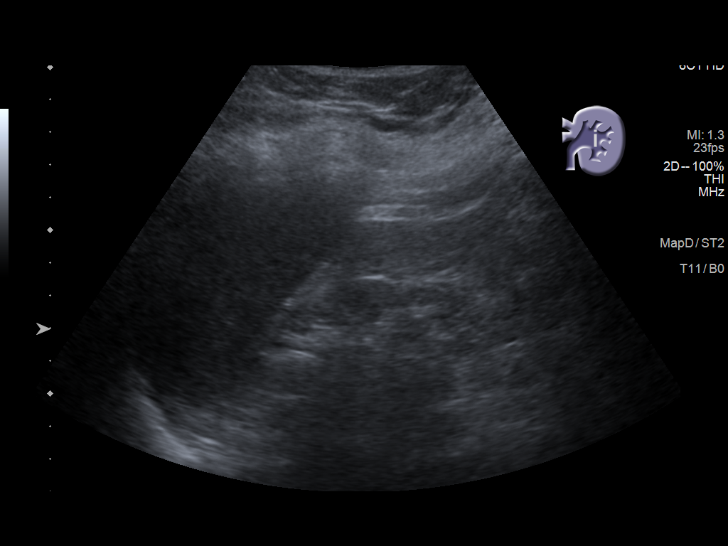
[im 21/31]
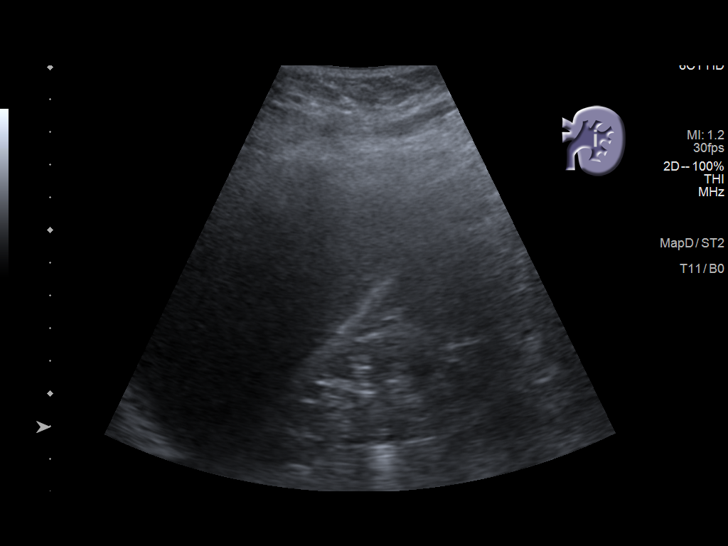
[im 23/31]
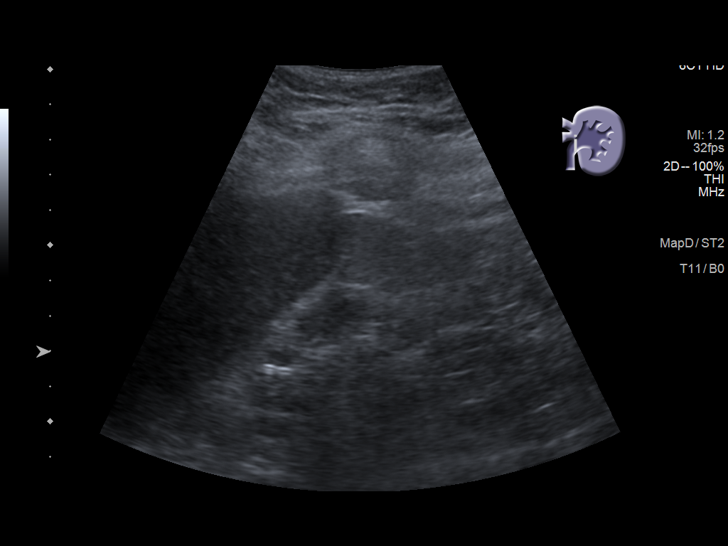
[im 26/31]
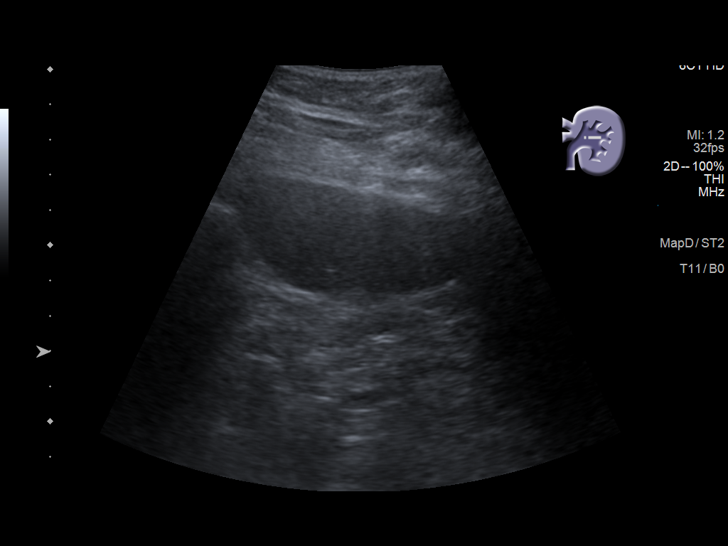
[im 28/31]
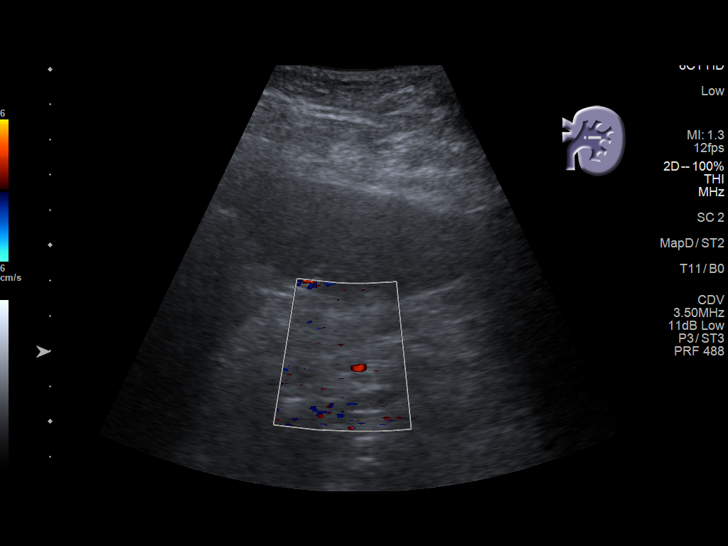
[im 31/31]
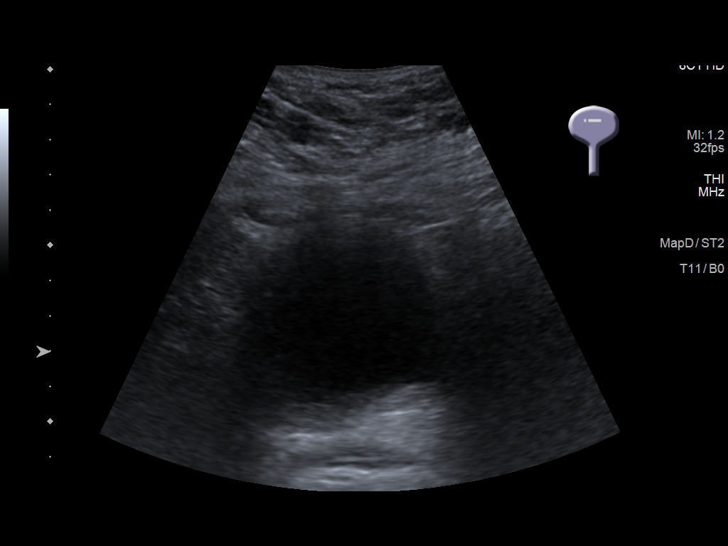

[13 of 25 positions shown; findings below may reference images not displayed]

FINDINGS: Right Kidney:

Renal measurements: 8.9 x 2.7 x 3.1 cm = volume: 38.1 mL. Increased
echogenicity consistent chronic renal disease. Severe cortical
thinning consistent with atrophy. No hydronephrosis. A 1.1 cm thinly
septated cystic mass is noted the right upper renal pole. No
definite solid lesions identified. Two other known small right renal
masses not identified by ultrasound. Renal MRI can be obtained to
further evaluate as needed.

Left Kidney:

Renal measurements: 7.1 x 3.3 x 3.3 cm = volume: 40.3 mL. Increased
echogenicity consistent chronic renal disease. Severe cortical
thinning consistent with atrophy. No hydronephrosis. No left renal
mass.

Bladder:

Appears normal for degree of bladder distention.
IMPRESSION: 1. Bilateral increased echogenicity consistent chronic renal
disease. Severe bilateral renal cortical atrophy. No hydronephrosis
or bladder distention.

2. 1 cm thinly septated cystic mass is noted the right upper renal
pole. No definite solid lesions identified. Two other known small
right renal mass is not identified by ultrasound. Renal MRI can be
obtained to further evaluate as needed.

## 2019-11-12 MED ORDER — DEXTROSE 50 % IV SOLN
INTRAVENOUS | Status: AC
  Filled 2019-11-12: qty 50

## 2019-11-12 MED ORDER — ACETAMINOPHEN 10 MG/ML IV SOLN
1000.0000 mg | Freq: Once | INTRAVENOUS | Status: AC
  Administered 2019-11-12: 1000 mg via INTRAVENOUS
  Filled 2019-11-12: qty 100

## 2019-11-12 MED ORDER — SODIUM CHLORIDE 0.9% FLUSH: 10.0000 mL | INTRAVENOUS | Status: DC | PRN

## 2019-11-12 MED ORDER — HYDROCODONE-ACETAMINOPHEN 7.5-325 MG/15ML PO SOLN
10.0000 mL | Freq: Four times a day (QID) | ORAL | Status: DC | PRN
  Administered 2019-11-12 (×2): 10 mL via ORAL
  Filled 2019-11-12 (×2): qty 15

## 2019-11-12 MED ORDER — SODIUM CHLORIDE 0.9% FLUSH
10.0000 mL | Freq: Two times a day (BID) | INTRAVENOUS | Status: DC
  Administered 2019-11-12 – 2019-11-18 (×11): 10 mL

## 2019-11-12 MED ORDER — DEXTROSE 50 % IV SOLN
INTRAVENOUS | Status: AC
  Administered 2019-11-12: 50 mL
  Filled 2019-11-12: qty 50

## 2019-11-12 NOTE — Progress Notes (Signed)
Post HD Assessment    11/12/19 2044  Neurological  Level of Consciousness Alert  Orientation Level Oriented X4  Respiratory  Respiratory Pattern Regular;Unlabored  Chest Assessment Chest expansion symmetrical  Bilateral Breath Sounds Clear;Diminished  Cardiac  Pulse Regular  Heart Sounds S1, S2  Cardiac Rhythm NSR  Vascular  R Radial Pulse +2  L Radial Pulse +1  Edema Left upper extremity  Musculoskeletal  Musculoskeletal (WDL) X  Generalized Weakness Yes  Psychosocial  Psychosocial (WDL) WDL  Patient Behaviors Calm;Cooperative  Needs Expressed Physical  Emotional support given Given to patient

## 2019-11-12 NOTE — Progress Notes (Signed)
Post HD    11/12/19 2045  Vital Signs  Temp 97.7 F (36.5 C)  Temp Source Rectal  Pulse Rate 70  Pulse Rate Source Monitor  Resp 16  BP (!) 155/49  BP Location Right Arm  BP Method Automatic  Patient Position (if appropriate) Lying  Oxygen Therapy  SpO2 100 %  O2 Device Nasal Cannula  O2 Flow Rate (L/min) 2 L/min  Post-Hemodialysis Assessment  Rinseback Volume (mL) 250 mL  KECN 61.6 V  Dialyzer Clearance Lightly streaked  Duration of HD Treatment -hour(s) 3.5 hour(s)  Hemodialysis Intake (mL) 500 mL  UF Total -Machine (mL) 1114 mL  Net UF (mL) 614 mL  Tolerated HD Treatment Yes  AVG/AVF Arterial Site Held (minutes)  (n/a)  AVG/AVF Venous Site Held (minutes)  (n/a)  Education / Care Plan  Dialysis Education Provided Yes  Documented Education in Care Plan Yes  Outpatient Plan of Care Reviewed and on Chart Yes  Hemodialysis Catheter Right Femoral vein Triple lumen Temporary (Non-Tunneled)  Placement Date/Time: 11/12/19 1350   Placed prior to admission: No  Time Out: Correct patient;Correct site;Correct procedure  Maximum sterile barrier precautions: Hand hygiene;Sterile gown;Mask;Cap;Sterile gloves;Large sterile sheet  Site Prep: Chlorh...  Site Condition No complications  Blue Lumen Status Heparin locked  Red Lumen Status Heparin locked  Purple Lumen Status N/A  Catheter fill solution Heparin 1000 units/ml  Catheter fill volume (Arterial) 1.8 cc  Catheter fill volume (Venous) 1.8  Dressing Type Biopatch;Occlusive  Dressing Status Clean;Dry;Intact  Interventions  (n/a)  Drainage Description None  Dressing Change Due 11/19/19  Post treatment catheter status Capped and Clamped

## 2019-11-12 NOTE — Progress Notes (Signed)
HD Completed    11/12/19 2030  Hand-Off documentation  Report given to (Full Name) A. Gordy Savers, RN  Report received from (Full Name) S.Couch-Bah, RN  Vital Signs  Temp 97.9 F (36.6 C)  Temp Source Rectal  Pulse Rate 68  Pulse Rate Source Monitor  Resp 11  BP (!) 159/53  BP Location Right Arm  BP Method Automatic  Patient Position (if appropriate) Lying  Oxygen Therapy  SpO2 100 %  O2 Device Nasal Cannula  O2 Flow Rate (L/min) 2 L/min  Dialysis Weight  Weight 98.7 kg  Type of Weight Post-Dialysis  During Hemodialysis Assessment  HD Safety Checks Performed Yes  KECN 61.6 KECN  Dialysis Fluid Bolus Normal Saline  Bolus Amount (mL) 250 mL  Intra-Hemodialysis Comments Tx completed

## 2019-11-12 NOTE — Progress Notes (Signed)
Spoke with ICU primary care nurse Meghan Wall regarding pt schedule for HD tx today. Pt does not have HD access per nurse. Will follow with Nephrologist regarding pt access and BG results.

## 2019-11-12 NOTE — Progress Notes (Signed)
    Subjective  - POD #1  Feels weak this am   Physical Exam:  Vac in place to left arm       Assessment/Plan:  POD #1  Acute blood loss anemia:  Hb up to 7 this am Trialysis catheter placed to right groin, for HD today  [facility] Echo Propp 11/12/2019 1:30 PM --  Vitals:   11/12/19 1000 11/12/19 1100  BP: (!) 141/50 (!) 141/45  Pulse: 70 71  Resp: 11 14  Temp: 99.7 F (37.6 C) 99.7 F (37.6 C)  SpO2:  100%    Intake/Output Summary (Last 24 hours) at 11/12/2019 1330 Last data filed at 11/12/2019 4944 Gross per 24 hour  Intake 87.52 ml  Output -  Net 87.52 ml     Laboratory CBC    Component Value Date/Time   WBC 5.9 11/12/2019 0600   HGB 7.6 (L) 11/12/2019 0600   HCT 22.3 (L) 11/12/2019 0600   PLT 105 (L) 11/12/2019 0600    BMET    Component Value Date/Time   NA 135 11/12/2019 0600   K 4.3 11/12/2019 0600   CL 101 11/12/2019 0600   CO2 24 11/12/2019 0600   GLUCOSE 58 (L) 11/12/2019 0600   BUN 31 (H) 11/12/2019 0600   CREATININE 7.71 (H) 11/12/2019 0600   CALCIUM 7.2 (L) 11/12/2019 0600   GFRNONAA 5 (L) 11/12/2019 0600   GFRAA 6 (L) 11/12/2019 0600    COAG Lab Results  Component Value Date   INR 1.4 (H) 11/12/2019   INR 1.5 (H) 11/11/2019   INR 1.4 (H) 11/11/2019   No results found for: PTT  Antibiotics Anti-infectives (From admission, onward)   Start     Dose/Rate Route Frequency Ordered Stop   11/12/19 0000  ceFAZolin (ANCEF) IVPB 1 g/50 mL premix    Note to Pharmacy: Send with pt to OR   1 g 100 mL/hr over 30 Minutes Intravenous On call 11/11/19 0751 11/13/19 0000       V. Leia Alf, M.D., [facility] [place] Office: [phone] Pager:  [phone]

## 2019-11-12 NOTE — Progress Notes (Signed)
Follow up - Critical Care Medicine Note  Patient Details:    [name] is an 59 y.o. female with ESRD on dialysis MWF presented with hemorrhagic shock due to bleeding left upper extremity dialysis access.  Had to be taken to the OR for excision of the left arm graft.  She returned intubated and mechanically ventilated.  She was able to be extubated later on the day of admission to ICU.  Lines, Airways, Drains: Negative Pressure Wound Therapy Arm Anterior;Left;Upper (Active)  Site / Wound Assessment Bleeding;Dressing in place / Unable to assess 11/11/19 1954  Cycle Continuous 11/12/19 1200  Dressing Status Intact 11/12/19 1200  Drainage Amount Minimal 11/12/19 1200  Drainage Description Serosanguineous 11/12/19 1200    Anti-infectives:  Anti-infectives (From admission, onward)   Start     Dose/Rate Route Frequency Ordered Stop   11/12/19 0000  ceFAZolin (ANCEF) IVPB 1 g/50 mL premix    Note to Pharmacy: Send with pt to OR   1 g 100 mL/hr over 30 Minutes Intravenous On call 11/11/19 0751 11/13/19 0000      Microbiology: Results for orders placed or performed during the hospital encounter of 11/11/19  Respiratory Panel by RT PCR (Flu A&B, Covid) - Nasopharyngeal Swab     Status: None   Collection Time: 11/11/19  7:00 AM   Specimen: Nasopharyngeal Swab  Result Value Ref Range Status   SARS Coronavirus 2 by RT PCR NEGATIVE NEGATIVE Final    Comment: (NOTE) SARS-CoV-2 target nucleic acids are NOT DETECTED. The SARS-CoV-2 RNA is generally detectable in upper respiratoy specimens during the acute phase of infection. The lowest concentration of SARS-CoV-2 viral copies this assay can detect is 131 copies/mL. A negative result does not preclude SARS-Cov-2 infection and should not be used as the sole basis for treatment or other patient management decisions. A negative result may occur with  improper specimen collection/handling, submission of specimen other than nasopharyngeal swab,  presence of viral mutation(s) within the areas targeted by this assay, and inadequate number of viral copies (<131 copies/mL). A negative result must be combined with clinical observations, patient history, and epidemiological information. The expected result is Negative. Fact Sheet for Patients:  PinkCheek.be Fact Sheet for Healthcare Providers:  GravelBags.it This test is not yet ap proved or cleared by the [place] FDA and  has been authorized for detection and/or diagnosis of SARS-CoV-2 by FDA under an Emergency Use Authorization (EUA). This EUA will remain  in effect (meaning this test can be used) for the duration of the COVID-19 declaration under Section 564(b)(1) of the Act, 21 U.S.C. section 360bbb-3(b)(1), unless the authorization is terminated or revoked sooner.    Influenza A by PCR NEGATIVE NEGATIVE Final   Influenza B by PCR NEGATIVE NEGATIVE Final    Comment: (NOTE) The Xpert Xpress SARS-CoV-2/FLU/RSV assay is intended as an aid in  the diagnosis of influenza from Nasopharyngeal swab specimens and  should not be used as a sole basis for treatment. Nasal washings and  aspirates are unacceptable for Xpert Xpress SARS-CoV-2/FLU/RSV  testing. Fact Sheet for Patients: PinkCheek.be Fact Sheet for Healthcare Providers: GravelBags.it This test is not yet approved or cleared by the [place] FDA and  has been authorized for detection and/or diagnosis of SARS-CoV-2 by  FDA under an Emergency Use Authorization (EUA). This EUA will remain  in effect (meaning this test can be used) for the duration of the  Covid-19 declaration under Section 564(b)(1) of the Act, 21  U.S.C. section 360bbb-3(b)(1),  unless the authorization is  terminated or revoked. Performed at [facility], [address] [place], [place] [address]   MRSA PCR Screening      Status: None   Collection Time: 11/11/19 11:31 AM   Specimen: Nasopharyngeal  Result Value Ref Range Status   MRSA by PCR NEGATIVE NEGATIVE Final    Comment:        The GeneXpert MRSA Assay (FDA approved for NASAL specimens only), is one component of a comprehensive MRSA colonization surveillance program. It is not intended to diagnose MRSA infection nor to guide or monitor treatment for MRSA infections. Performed at [facility], [address] [place], [place] [address]     Best Practice/Protocols:  VTE Prophylaxis: Mechanical GI Prophylaxis: Proton Pump Inhibitor Hyperglycemia (ICU)  Events: 02/19: Admitted to ICU after excision of of left arm graft due to hemorrhage.  Transient need for mechanical ventilation: Resolved. 02/20: Vascular surgery has placed new temporary dialysis catheter, restart dialysis  Studies: DG Chest Port 1 View  Result Date: 11/11/2019 CLINICAL DATA:  Respiratory failure. EXAM: PORTABLE CHEST 1 VIEW COMPARISON:  09/30/2019. FINDINGS: Endotracheal tube noted with tip 5 cm above the carina. Heart size normal. Bilateral pulmonary infiltrates/edema. Small bilateral pleural effusion. No pneumothorax. Left subclavian stents noted. Surgical staples noted over the left chest. IMPRESSION: 1.  Endotracheal tube noted with tip 5 cm above the carina. 2.  Diffuse bilateral pulmonary infiltrates/edema. Electronically Signed   By: Marcello Moores  Register   On: 11/11/2019 12:00   ECHOCARDIOGRAM COMPLETE  Result Date: 11/12/2019    ECHOCARDIOGRAM REPORT   Patient Name:   [name] Date of Exam: 11/11/2019 Medical Rec #:  [id number]   Height:       63.0 in Accession #:    [id number]  Weight:       194.2 lb Date of Birth:  May 29, 1961    BSA:          1.91 m Patient Age:    53 years    BP:           105/50 mmHg Patient Gender: F           HR:           49 bpm. Exam Location:  [facility] Procedure: 2D Echo, Cardiac Doppler and Color Doppler Indications:     Acute Resp.  Insufficiency  History:         Patient has no prior history of Echocardiogram examinations.  Sonographer:     Alyse Low Roar Referring Phys:  [id number] Homer Miller Veda Canning Diagnosing Phys: Mertie Moores MD IMPRESSIONS  1. Left ventricular ejection fraction, by estimation, is 55 to 60%. The left ventricle has normal function. The left ventricle has no regional wall motion abnormalities. Left ventricular diastolic parameters are consistent with Grade II diastolic dysfunction (pseudonormalization).  2. Right ventricular systolic function is normal. The right ventricular size is normal. There is moderately elevated pulmonary artery systolic pressure.  3. Left atrial size was mildly dilated.  4. The mitral valve is normal in structure and function. No evidence of mitral valve regurgitation. No evidence of mitral stenosis.  5. The aortic valve is normal in structure and function. Aortic valve regurgitation is not visualized. No aortic stenosis is present. FINDINGS  Left Ventricle: Left ventricular ejection fraction, by estimation, is 55 to 60%. The left ventricle has normal function. The left ventricle has no regional wall motion abnormalities. The left ventricular internal cavity size was small. There is no left ventricular  hypertrophy. Left ventricular diastolic parameters are consistent with Grade II diastolic dysfunction (pseudonormalization). Right Ventricle: The right ventricular size is normal. No increase in right ventricular wall thickness. Right ventricular systolic function is normal. There is moderately elevated pulmonary artery systolic pressure. The tricuspid regurgitant velocity is 3.14 m/s, and with an assumed right atrial pressure of 10 mmHg, the estimated right ventricular systolic pressure is 75.9 mmHg. Left Atrium: Left atrial size was mildly dilated. Right Atrium: Right atrial size was normal in size. Pericardium: There is no evidence of pericardial effusion. Mitral Valve: The mitral valve is normal in  structure and function. No evidence of mitral valve regurgitation. No evidence of mitral valve stenosis. Tricuspid Valve: The tricuspid valve is normal in structure. Tricuspid valve regurgitation is mild. Aortic Valve: The aortic valve is normal in structure and function. Aortic valve regurgitation is not visualized. No aortic stenosis is present. Aortic valve mean gradient measures 4.0 mmHg. Aortic valve peak gradient measures 9.4 mmHg. Aortic valve area, by VTI measures 1.94 cm. Pulmonic Valve: The pulmonic valve was normal in structure. Pulmonic valve regurgitation is mild. Aorta: The aortic root and ascending aorta are structurally normal, with no evidence of dilitation. IAS/Shunts: The atrial septum is grossly normal.  LEFT VENTRICLE PLAX 2D LVIDd:         4.45 cm  Diastology LVIDs:         3.34 cm  LV e' lateral:   7.29 cm/s LV PW:         1.04 cm  LV E/e' lateral: 14.7 LV IVS:        1.23 cm  LV e' medial:    5.77 cm/s LVOT diam:     1.80 cm  LV E/e' medial:  18.5 LV SV:         64.64 ml LV SV Index:   22.12 LVOT Area:     2.54 cm  RIGHT VENTRICLE RV Mid diam:    2.99 cm RV S prime:     11.20 cm/s LEFT ATRIUM           Index LA diam:      4.40 cm 2.30 cm/m LA Vol (A4C): 58.5 ml 30.63 ml/m  AORTIC VALVE                   PULMONIC VALVE AV Area (Vmax):    1.81 cm    PV Vmax:        0.91 m/s AV Area (Vmean):   2.14 cm    PV Peak grad:   3.3 mmHg AV Area (VTI):     1.94 cm    RVOT Peak grad: 2 mmHg AV Vmax:           153.00 cm/s AV Vmean:          93.700 cm/s AV VTI:            0.334 m AV Peak Grad:      9.4 mmHg AV Mean Grad:      4.0 mmHg LVOT Vmax:         109.00 cm/s LVOT Vmean:        78.900 cm/s LVOT VTI:          0.254 m LVOT/AV VTI ratio: 0.76  AORTA Ao Root diam: 2.60 cm MITRAL VALVE                TRICUSPID VALVE MV Area (PHT): 2.55 cm     TR Peak grad:   39.4 mmHg MV  Decel Time: 298 msec     TR Vmax:        314.00 cm/s MV E velocity: 107.00 cm/s MV A velocity: 110.00 cm/s  SHUNTS MV E/A ratio:   0.97         Systemic VTI:  0.25 m                             Systemic Diam: 1.80 cm Mertie Moores MD Electronically signed by Mertie Moores MD Signature Date/Time: 11/12/2019/2:05:31 PM    Final     Consults: Treatment Team:  Anthonette Legato, MD  Hortencia Pilar, MD Ida Rogue, MD  Subjective:    Overnight Issues: Tolerated extubation yesterday without difficulty.  Main complaint is of chronic back pain for which she takes Vicodin.  She also states that her tongue is sore and she has a sore throat.  Objective:  Vital signs for last 24 hours: Temp:  [96.1 F (35.6 C)-99.7 F (37.6 C)] 99.7 F (37.6 C) (02/20 1500) Pulse Rate:  [49-72] 70 (02/20 1500) Resp:  [10-24] 12 (02/20 1500) BP: (105-167)/(44-109) 141/54 (02/20 1500) SpO2:  [96 %-100 %] 100 % (02/20 1500) Weight:  [95.4 kg-96.3 kg] 96.3 kg (02/20 0400)  Hemodynamic parameters for last 24 hours:    Intake/Output from previous day: 02/19 0701 - 02/20 0700 In: 3258.5 [I.V.:1500; Blood:911; IV Piggyback:847.5] Out: 75 [Blood:75]  Intake/Output this shift: No intake/output data recorded.  Vent settings for last 24 hours:    Physical Exam:  GENERAL: Pale, chronically ill-appearing woman looks older than stated age.  Weak voice, no respiratory distress. HEAD: Normocephalic, atraumatic.  EYES: Pupils equal, round, reactive to light.  No scleral icterus.  Conjunctiva pale. MOUTH: Oral mucosa moist. NECK: Supple. No thyromegaly. No nodules. No JVD.  Trachea midline, no crepitus PULMONARY: Lungs clear to auscultation bilaterally.  Good air entry bilaterally. CARDIOVASCULAR: S1 and S2.    Regular rate and rhythm.  No murmurs appreciated.  No further episodes of bradycardia GASTROINTESTINAL: Protuberant/obese abdomen, soft, nondistended, normoactive bowel sounds. MUSCULOSKELETAL: No joint deformity, no clubbing, no edema.  Wound on the left upper extremity with wound VAC in place. NEUROLOGIC:  No overt focal deficits,  somnolent but arousable. SKIN: Intact,warm,dry.  Nailbeds pale. PSYCH:  Affect somewhat labile.  Assessment/Plan:   Acute respiratory failure, ventilator dependent Post surgical intervention Resolved Extubated yesterday without sequela Nebulizers as needed   Profound bradycardia Hemodynamically stable Check thyroid function 2D echo: LVEF 55 to 85%, grade 2 diastolic dysfunction Moderate pulmonary hypertension by echo likely due to diastolic dysfunction Consulted with cardiology: likely Bezold-Jarisch reflex (akin of vasovagal syncope/response) in the setting of acute blood loss/hemorrhagic shock; resolved   Hemorrhagic shock Dialysis access hemorrhage Acute blood loss anemia superimposed on chronic anemia Patient was transfused yesterday Consider transfusion of 1 unit of blood with dialysis today Infected ulcer at dialysis access site cause erosion Excision of the left arm graft per vascular Dialysis catheter placement today per vascular  ESRD Resuming dialysis today Discussed with Dr. Juleen China  Infected dialysis graft Removed by vascular surgery Received Ancef x1 Wound VAC in place    LOS: 1 day   Additional comments: Patient stable for transfer to [place] floor once dialysis is completed.  We will have hospitalist service assume care.  Critical Care Total Time*:     C. Derrill Kay, MD  PCCM 11/12/2019  *Care during the described time interval was provided by me and/or other providers on  the critical care team.  I have reviewed this patient's available data, including medical history, events of note, physical examination and test results as part of my evaluation.

## 2019-11-12 NOTE — Progress Notes (Signed)
Pre HD     11/12/19 1650  Vital Signs  Temp 99.7 F (37.6 C)  Pulse Rate 68  Resp 18  BP (!) 141/54  Oxygen Therapy  SpO2 100 %  O2 Device Nasal Cannula  Pain Assessment  Pain Scale 0-10  Pain Score 0  Dialysis Weight  Weight 96.7 kg  Type of Weight Pre-Dialysis  Time-Out for Hemodialysis  What Procedure? HD   Pt Identifiers(min of two) First/Last Name;MRN/Account#;Pt's DOB(use if MRN/Acct# not available  Correct Site? Yes  Correct Side? Yes  Correct Procedure? Yes  Consents Verified? Yes  Rad Studies Available? N/A  Safety Precautions Reviewed? Yes  Engineer, civil (consulting) Number 1  Station Number  (icu 19)  UF/Alarm Test Passed  Conductivity: Meter 14  Conductivity: Machine  14  pH 7.4  Reverse Osmosis 3  Normal Saline Lot Number [id number]  Dialyzer Lot Number [id number]  Disposable Set Lot Number [id number]  Machine Temperature 98.6 F (37 C)  Musician and Audible Yes  Blood Lines Intact and Secured Yes  Pre Treatment Patient Checks  Vascular access used during treatment Catheter  HD catheter dressing before treatment  (n/a)  Patient is receiving dialysis in a chair  (no)  Hemodialysis Consent Verified Yes  Hemodialysis Standing Orders Initiated Yes  ECG (Telemetry) Monitor On Yes  Prime Ordered Normal Saline  Length of  DialysisTreatment -hour(s) 3.5 Hour(s)  Dialysis Treatment Comments  (Na 140)  Dialyzer Elisio 17H NR  Dialysate 2K;2.5 Ca  Dialysate Flow Ordered 800  Blood Flow Rate Ordered 400 mL/min  Ultrafiltration Goal 0.5 Liters  Dialysis Blood Pressure Support Ordered Normal Saline  Education / Care Plan  Dialysis Education Provided Yes  Documented Education in Care Plan Yes  Outpatient Plan of Care Reviewed and on Chart Yes  Hemodialysis Catheter Right Femoral vein Triple lumen Temporary (Non-Tunneled)  Placement Date/Time: 11/12/19 1350   Placed prior to admission: No  Time Out: Correct patient;Correct site;Correct procedure  Maximum  sterile barrier precautions: Hand hygiene;Sterile gown;Mask;Cap;Sterile gloves;Large sterile sheet  Site Prep: Chlorh...  Site Condition No complications  Blue Lumen Status Blood return noted  Red Lumen Status Blood return noted  Purple Lumen Status N/A  Dressing Type Biopatch;Occlusive  Dressing Status Clean;Dry;Intact  Drainage Description None  Dressing Change Due 11/13/19

## 2019-11-12 NOTE — Progress Notes (Signed)
Patient ID: [name], female   DOB: 1960-11-23, 59 y.o.   MRN: [id number]  Have been asked to pick up patient from ICU service in a.m. In summary patient is a 59 year old female with end-stage renal disease on dialysis presented with bleeding left upper extremity dialysis access.  He has end-stage renal disease on dialysis Monday Wednesday Friday.  Bleeding was apparently excessive and unable to control it.  EMS was activated and they had reported copious amount of blood at home.  They estimated about a couple of liters.  When they arrived patient was hypotensive, pale and lethargic.  She was minimally responsive.  H&H was 5.5 and 17.6 with hemorrhagic shock.  She was evaluated by a vascular surgery.  She was transfused 2 units and was taken to the OR emergently for management of the bleeding.  Patient required excision of the infected left arm brachial artery AV graft and placement of a wound VAC.  Patient was noted to be bradycardic during the procedure and it was elected to leave her intubated.  She was transferred to ICU for mechanical ventilation.  Was extubated on 11/11/2019.  Since she had profound bradycardia in the 40s cardiology was consulted.  They obtain echo.  They thought possibly rate may improve after extubation.  Cards thought this was a reflex in the setting of acute blood loss and hemorrhagic shock.  No indication for pacemaker.  Heart rate has improved.  Patient will be transferred to the hospitalist service on 11/13/2019

## 2019-11-12 NOTE — Progress Notes (Signed)
Progress Note  Patient Name: [name] Date of Encounter: 11/12/2019  Primary Cardiologist:   Rockey Situ   Subjective   59 year old female with a history of end-stage renal disease currently on dialysis.  She has a history of peripheral arterial disease.  She presented with uncontrolled bleeding from her left upper extremity hemodialysis graft.  We are asked to see her yesterday for bradycardia.   ECG from yesterday reveals sinus bradycardia.  PR interval is normal. HR is normal.  She is feeling better.  I suspect this bradycardia was due to [employer] reflex.    Inpatient Medications    Scheduled Meds: . Chlorhexidine Gluconate Cloth  6 each Topical Daily  . influenza vac split quadrivalent PF  0.5 mL Intramuscular Tomorrow-1000  . pantoprazole (PROTONIX) IV  40 mg Intravenous Daily  . pneumococcal 23 valent vaccine  0.5 mL Intramuscular Tomorrow-1000  . sodium chloride flush  10-40 mL Intracatheter Q12H   Continuous Infusions: .  ceFAZolin (ANCEF) IV     PRN Meds: fentaNYL (SUBLIMAZE) injection, HYDROcodone-acetaminophen, ipratropium-albuterol, ondansetron (ZOFRAN) IV, phenol, sodium chloride flush   Vital Signs    Vitals:   11/12/19 0800 11/12/19 0900 11/12/19 1000 11/12/19 1100  BP: (!) 139/55 (!) 143/54 (!) 141/50 (!) 141/45  Pulse: 72 71 70 71  Resp: 16 (!) 24 11 14   Temp: 98.8 F (37.1 C) 99.5 F (37.5 C) 99.7 F (37.6 C) 99.7 F (37.6 C)  TempSrc:      SpO2: 100% 100%  100%  Weight:      Height:        Intake/Output Summary (Last 24 hours) at 11/12/2019 1153 Last data filed at 11/12/2019 0925 Gross per 24 hour  Intake 87.52 ml  Output --  Net 87.52 ml   Last 3 Weights 11/12/2019 11/11/2019 11/11/2019  Weight (lbs) 212 lb 4.9 oz 210 lb 5.1 oz 194 lb 3.6 oz  Weight (kg) 96.3 kg 95.4 kg 88.1 kg      Telemetry    NSR at 68  - Personally Reviewed  ECG     - Personally Reviewed  Physical Exam   GEN: No acute distress.  Middle age female,  NAD  . somulent  Neck: No JVD Cardiac: RRR, no murmurs, rubs, or gallops.  Respiratory: Clear to auscultation bilaterally. GI: Soft, nontender, non-distended  MS:  1-2 + edema in left hand ,  luft upper arm is bandaged.  Neuro:  Nonfocal  Psych: Normal affect   Labs    High Sensitivity Troponin:   Recent Labs  Lab 11/11/19 1132 11/11/19 1325  TROPONINIHS 6 6      Chemistry Recent Labs  Lab 11/11/19 0612 11/11/19 1325 11/12/19 0600  NA 132* 132* 135  K 4.2 4.1 4.3  CL 97* 98 101  CO2 23 24 24   GLUCOSE 151* 200* 58*  BUN 27* 27* 31*  CREATININE 7.24* 6.89* 7.71*  CALCIUM 7.0* 6.8* 7.2*  PROT 4.4* 4.7*  --   ALBUMIN 2.2* 2.8*  --   AST 13* 15  --   ALT 9 9  --   ALKPHOS 49 41  --   BILITOT 0.8 1.1  --   GFRNONAA 6* 6* 5*  GFRAA 6* 7* 6*  ANIONGAP 12 10 10      Hematology Recent Labs  Lab 11/11/19 1325 11/11/19 1958 11/12/19 0600  WBC 8.4 6.2 5.9  RBC 3.22* 2.74* 2.75*  HGB 8.6* 7.5* 7.6*  HCT 26.5* 22.0* 22.3*  MCV 82.3 80.3 81.1  MCH 26.7 27.4 27.6  MCHC 32.5 34.1 34.1  RDW 17.6* 18.2* 18.6*  PLT 102* 98* 105*    BNPNo results for input(s): BNP, PROBNP in the last 168 hours.   DDimer No results for input(s): DDIMER in the last 168 hours.   Radiology    DG Chest Port 1 View  Result Date: 11/11/2019 CLINICAL DATA:  Respiratory failure. EXAM: PORTABLE CHEST 1 VIEW COMPARISON:  09/30/2019. FINDINGS: Endotracheal tube noted with tip 5 cm above the carina. Heart size normal. Bilateral pulmonary infiltrates/edema. Small bilateral pleural effusion. No pneumothorax. Left subclavian stents noted. Surgical staples noted over the left chest. IMPRESSION: 1.  Endotracheal tube noted with tip 5 cm above the carina. 2.  Diffuse bilateral pulmonary infiltrates/edema. Electronically Signed   By: Marcello Moores  Register   On: 11/11/2019 12:00    Cardiac Studies      Patient Profile     59 y.o. female with ESRD admitted with infected dialysis graft and uncontrolled bleeding    Assessment & Plan    1.   Sinus bradycardia in the setting of acute hemorrhagic shock and ongoing bleeding.  I suspect the bradycardia was due to a Bezold Jarish  Reflex in the setting of acute blood loss /hemorrhagic shock .   HR is better.   No indication for pacer at this time .  [facility] will sign off.   Medication Recommendations:  Cont current meds.  Other recommendations (labs, testing, etc):   Follow up as an outpatient:   With Dr. Rockey Situ if needed.   For questions or updates, please contact [place] Please consult [email or web address] for contact info under        Signed, Mertie Moores, MD  11/12/2019, 11:53 AM

## 2019-11-12 NOTE — Progress Notes (Signed)
[facility]  ROUNDING NOTE   Subjective:   Ligation of AVG yesterday due to rupture.   Currently with no dialysis access. Dialysis is scheduled for today.   Objective:  Vital signs in last 24 hours:  Temp:  [91.2 F (32.9 C)-99.7 F (37.6 C)] 99.7 F (37.6 C) (02/20 1100) Pulse Rate:  [43-72] 71 (02/20 1100) Resp:  [10-24] 14 (02/20 1100) BP: (105-167)/(44-109) 141/45 (02/20 1100) SpO2:  [96 %-100 %] 100 % (02/20 1100) FiO2 (%):  [30 %] 30 % (02/19 1400) Weight:  [95.4 kg-96.3 kg] 96.3 kg (02/20 0400)  Weight change:  Filed Weights   11/11/19 0808 11/11/19 1954 11/12/19 0400  Weight: 88.1 kg 95.4 kg 96.3 kg    Intake/Output: I/O last 3 completed shifts: In: 3258.5 [I.V.:1500; Blood:911; IV Piggyback:847.5] Out: 75 [Blood:75]   Intake/Output this shift:  No intake/output data recorded.  Physical Exam: General: NAD  Head: [place]/AT  Eyes: PERRL  Neck: Supple, trachea midline  Lungs:  Clear to auscultation , vent assisted  Heart: regular  Abdomen:  Soft, nontender, bowel sounds present  Extremities: 1+ peripheral edema, left arm with clean dressings.  Neurologic: Alert and oriented  Skin: No lesions  Access: none    Basic Metabolic Panel: Recent Labs  Lab 11/11/19 0612 11/11/19 1325 11/12/19 0600  NA 132* 132* 135  K 4.2 4.1 4.3  CL 97* 98 101  CO2 23 24 24   GLUCOSE 151* 200* 58*  BUN 27* 27* 31*  CREATININE 7.24* 6.89* 7.71*  CALCIUM 7.0* 6.8* 7.2*  MG  --  2.0 2.0  PHOS  --   --  5.2*    Liver Function Tests: Recent Labs  Lab 11/11/19 0612 11/11/19 1325  AST 13* 15  ALT 9 9  ALKPHOS 49 41  BILITOT 0.8 1.1  PROT 4.4* 4.7*  ALBUMIN 2.2* 2.8*   No results for input(s): LIPASE, AMYLASE in the last 168 hours. No results for input(s): AMMONIA in the last 168 hours.  CBC: Recent Labs  Lab 11/11/19 0612 11/11/19 0820 11/11/19 1325 11/11/19 1958 11/12/19 0600  WBC 4.6 6.6 8.4 6.2 5.9  NEUTROABS 2.0 4.2 6.5 4.5 4.0  HGB 5.5*  9.3* 8.6* 7.5* 7.6*  HCT 17.6* 28.2* 26.5* 22.0* 22.3*  MCV 89.8 87.9 82.3 80.3 81.1  PLT 161 131* 102* 98* 105*    Cardiac Enzymes: No results for input(s): CKTOTAL, CKMB, CKMBINDEX, TROPONINI in the last 168 hours.  BNP: Invalid input(s): POCBNP  CBG: Recent Labs  Lab 11/11/19 0808 11/11/19 1100 11/12/19 1053 11/12/19 1115  GLUCAP 116* 106* 35* 115*    Microbiology: Results for orders placed or performed during the hospital encounter of 11/11/19  Respiratory Panel by RT PCR (Flu A&B, Covid) - Nasopharyngeal Swab     Status: None   Collection Time: 11/11/19  7:00 AM   Specimen: Nasopharyngeal Swab  Result Value Ref Range Status   SARS Coronavirus 2 by RT PCR NEGATIVE NEGATIVE Final    Comment: (NOTE) SARS-CoV-2 target nucleic acids are NOT DETECTED. The SARS-CoV-2 RNA is generally detectable in upper respiratoy specimens during the acute phase of infection. The lowest concentration of SARS-CoV-2 viral copies this assay can detect is 131 copies/mL. A negative result does not preclude SARS-Cov-2 infection and should not be used as the sole basis for treatment or other patient management decisions. A negative result may occur with  improper specimen collection/handling, submission of specimen other than nasopharyngeal swab, presence of viral mutation(s) within the areas targeted by  this assay, and inadequate number of viral copies (<131 copies/mL). A negative result must be combined with clinical observations, patient history, and epidemiological information. The expected result is Negative. Fact Sheet for Patients:  PinkCheek.be Fact Sheet for Healthcare Providers:  GravelBags.it This test is not yet ap proved or cleared by the [place] FDA and  has been authorized for detection and/or diagnosis of SARS-CoV-2 by FDA under an Emergency Use Authorization (EUA). This EUA will remain  in effect (meaning this  test can be used) for the duration of the COVID-19 declaration under Section 564(b)(1) of the Act, 21 U.S.C. section 360bbb-3(b)(1), unless the authorization is terminated or revoked sooner.    Influenza A by PCR NEGATIVE NEGATIVE Final   Influenza B by PCR NEGATIVE NEGATIVE Final    Comment: (NOTE) The Xpert Xpress SARS-CoV-2/FLU/RSV assay is intended as an aid in  the diagnosis of influenza from Nasopharyngeal swab specimens and  should not be used as a sole basis for treatment. Nasal washings and  aspirates are unacceptable for Xpert Xpress SARS-CoV-2/FLU/RSV  testing. Fact Sheet for Patients: PinkCheek.be Fact Sheet for Healthcare Providers: GravelBags.it This test is not yet approved or cleared by the [place] FDA and  has been authorized for detection and/or diagnosis of SARS-CoV-2 by  FDA under an Emergency Use Authorization (EUA). This EUA will remain  in effect (meaning this test can be used) for the duration of the  Covid-19 declaration under Section 564(b)(1) of the Act, 21  U.S.C. section 360bbb-3(b)(1), unless the authorization is  terminated or revoked. Performed at [facility], [address] [place], [place] [address]   MRSA PCR Screening     Status: None   Collection Time: 11/11/19 11:31 AM   Specimen: Nasopharyngeal  Result Value Ref Range Status   MRSA by PCR NEGATIVE NEGATIVE Final    Comment:        The GeneXpert MRSA Assay (FDA approved for NASAL specimens only), is one component of a comprehensive MRSA colonization surveillance program. It is not intended to diagnose MRSA infection nor to guide or monitor treatment for MRSA infections. Performed at [facility], [address] [place],  [address]     Coagulation Studies: Recent Labs    11/11/19 0612 11/11/19 1132 11/12/19 0600  LABPROT 17.2* 18.1* 17.1*  INR 1.4* 1.5* 1.4*    Urinalysis: No  results for input(s): COLORURINE, LABSPEC, PHURINE, GLUCOSEU, HGBUR, BILIRUBINUR, KETONESUR, PROTEINUR, UROBILINOGEN, NITRITE, LEUKOCYTESUR in the last 72 hours.  Invalid input(s): APPERANCEUR    Imaging: DG Chest Port 1 View  Result Date: 11/11/2019 CLINICAL DATA:  Respiratory failure. EXAM: PORTABLE CHEST 1 VIEW COMPARISON:  09/30/2019. FINDINGS: Endotracheal tube noted with tip 5 cm above the carina. Heart size normal. Bilateral pulmonary infiltrates/edema. Small bilateral pleural effusion. No pneumothorax. Left subclavian stents noted. Surgical staples noted over the left chest. IMPRESSION: 1.  Endotracheal tube noted with tip 5 cm above the carina. 2.  Diffuse bilateral pulmonary infiltrates/edema. Electronically Signed   By: Marcello Moores  Register   On: 11/11/2019 12:00     Medications:   .  ceFAZolin (ANCEF) IV     . Chlorhexidine Gluconate Cloth  6 each Topical Daily  . influenza vac split quadrivalent PF  0.5 mL Intramuscular Tomorrow-1000  . pantoprazole (PROTONIX) IV  40 mg Intravenous Daily  . pneumococcal 23 valent vaccine  0.5 mL Intramuscular Tomorrow-1000  . sodium chloride flush  10-40 mL Intracatheter Q12H   fentaNYL (SUBLIMAZE) injection, HYDROcodone-acetaminophen, ipratropium-albuterol, ondansetron (ZOFRAN)  IV, phenol, sodium chloride flush  Assessment/ Plan:  58 y.o. female  [name] is a 59 y.o. black female with end stage renal disease on hemodialysis for last fifteen years, pancreatic insufficiency, hypertension, peripheral vascular disease, GERD, diabetes mellitus type II,  with past medical history of ESRD on HD MWF, anemia of chronic kidney disease, COPD, hyperlipidemia who is admitted to [facility] on 11/11/2019 for Hemorrhagic shock ([place]) [R57.8] ESRD on hemodialysis ([place]) [T24.4, Q28.6] Complication of arteriovenous dialysis fistula, initial encounter [T82.9XXA]  [facility] MWF [facility] 86.5kg  1.  ESRD on HD MWF.  With complication of dialysis device. Rupture  of AVG. Status post ligation by Dr. Delana Meyer on 2/20 - Dialysis access to be placed today and then patient scheduled for dialysis treatment - Appreciate ICU and vascular input  2.  Hypovolemic shock with hypotension. Status post 2 units PRBC and 1 unit FFP on 2/19.  Hemodynamically stable at this time. No longer requiring vasopressors.   3.  Anemia of chronic kidney disease with acute blood loss Status post transfusion as above - EPO with HD treatment  4.  Secondary hyperparathyroidism: currently holding binders.    LOS: 1 Roosvelt [place] 2/20/202111:29 AM

## 2019-11-12 NOTE — Progress Notes (Signed)
Pre HD Assessment    11/12/19 1651  Neurological  Level of Consciousness Alert  Orientation Level Oriented X4  Respiratory  Respiratory Pattern Regular;Unlabored  Chest Assessment Chest expansion symmetrical  Bilateral Breath Sounds Clear;Diminished  Cardiac  Pulse Regular  Heart Sounds S1, S2  Cardiac Rhythm NSR  Vascular  R Radial Pulse +2  L Radial Pulse +1  Edema Left upper extremity  Musculoskeletal  Musculoskeletal (WDL) X  Generalized Weakness Yes  Psychosocial  Psychosocial (WDL) WDL  Patient Behaviors Calm;Cooperative

## 2019-11-12 NOTE — Progress Notes (Signed)
    Patient name: [name] MRN: [id number] DOB: 1960-11-01 Sex: female  11/11/2019 Pre-operative Diagnosis: End-stage renal disease Post-operative diagnosis:  Same Surgeon:  Annamarie Major Procedure:   Insertion of nontunneled Trialysis catheter Blood Loss: Minimal Indications: The patient presented with bleeding from her left upper arm graft yesterday which was removed.  She is now in need of dialysis access.  She currently has a right femoral triple-lumen catheter which will be exchanged for a dialysis catheter  Procedure: I discussed the procedure with the patient at her bedside.  The right groin was prepped and draped in the usual sterile fashion including the existing triple-lumen catheter.  A 035 wire was threaded into the triple-lumen catheter which was then removed.  I used 1% lidocaine for local anesthesia.  A dilator was then inserted to dilate the subcutaneous tract and a 30 cm Trialysis catheter was inserted.  All 3 ports flushed and aspirated without difficulty.  The catheter was sutured into position.  Sterile dressings were applied.  There were no complications.  The catheter is ready for use    V. Annamarie Major, M.D., [facility] [place] Office: [phone] Pager:  [phone]

## 2019-11-12 NOTE — Progress Notes (Signed)
HD Initiated   11/12/19 1700  Vital Signs  Temp 99.7 F (37.6 C)  Pulse Rate 69  BP (!) 152/62  Oxygen Therapy  SpO2 100 %  O2 Device Nasal Cannula  During Hemodialysis Assessment  Blood Flow Rate (mL/min) 250 mL/min  Arterial Pressure (mmHg) -110 mmHg  Venous Pressure (mmHg) 90 mmHg  Transmembrane Pressure (mmHg) 60 mmHg  Ultrafiltration Rate (mL/min) 290 mL/min  Dialysate Flow Rate (mL/min) 800 ml/min  Conductivity: Machine  14  HD Safety Checks Performed Yes  Dialysis Fluid Bolus Normal Saline  Bolus Amount (mL) 250 mL  Intra-Hemodialysis Comments Tx initiated

## 2019-11-13 ENCOUNTER — Encounter: Payer: Self-pay | Admitting: Pulmonary Disease

## 2019-11-13 DIAGNOSIS — I739 Peripheral vascular disease, unspecified: Secondary | ICD-10-CM

## 2019-11-13 DIAGNOSIS — T829XXA Unspecified complication of cardiac and vascular prosthetic device, implant and graft, initial encounter: Secondary | ICD-10-CM

## 2019-11-13 DIAGNOSIS — D649 Anemia, unspecified: Secondary | ICD-10-CM | POA: Diagnosis present

## 2019-11-13 DIAGNOSIS — L8992 Pressure ulcer of unspecified site, stage 2: Secondary | ICD-10-CM | POA: Insufficient documentation

## 2019-11-13 DIAGNOSIS — L8990 Pressure ulcer of unspecified site, unspecified stage: Secondary | ICD-10-CM | POA: Insufficient documentation

## 2019-11-13 LAB — CBC WITH DIFFERENTIAL/PLATELET
Abs Immature Granulocytes: 0.01 10*3/uL (ref 0.00–0.07)
Abs Immature Granulocytes: 0.01 10*3/uL (ref 0.00–0.07)
Abs Immature Granulocytes: 0.01 10*3/uL (ref 0.00–0.07)
Abs Immature Granulocytes: 0.02 10*3/uL (ref 0.00–0.07)
Basophils Absolute: 0 10*3/uL (ref 0.0–0.1)
Basophils Absolute: 0 10*3/uL (ref 0.0–0.1)
Basophils Absolute: 0 10*3/uL (ref 0.0–0.1)
Basophils Absolute: 0 10*3/uL (ref 0.0–0.1)
Basophils Relative: 0 %
Basophils Relative: 0 %
Basophils Relative: 0 %
Basophils Relative: 0 %
Eosinophils Absolute: 0.4 10*3/uL (ref 0.0–0.5)
Eosinophils Absolute: 0.5 10*3/uL (ref 0.0–0.5)
Eosinophils Absolute: 0.6 10*3/uL — ABNORMAL HIGH (ref 0.0–0.5)
Eosinophils Absolute: 0.7 10*3/uL — ABNORMAL HIGH (ref 0.0–0.5)
Eosinophils Relative: 7 %
Eosinophils Relative: 10 %
Eosinophils Relative: 10 %
Eosinophils Relative: 11 %
HCT: 20.6 % — ABNORMAL LOW (ref 36.0–46.0)
HCT: 20.5 % — ABNORMAL LOW (ref 36.0–46.0)
HCT: 20.7 % — ABNORMAL LOW (ref 36.0–46.0)
HCT: 23.4 % — ABNORMAL LOW (ref 36.0–46.0)
Hemoglobin: 6.7 g/dL — ABNORMAL LOW (ref 12.0–15.0)
Hemoglobin: 6.6 g/dL — ABNORMAL LOW (ref 12.0–15.0)
Hemoglobin: 6.6 g/dL — ABNORMAL LOW (ref 12.0–15.0)
Hemoglobin: 7.5 g/dL — ABNORMAL LOW (ref 12.0–15.0)
Immature Granulocytes: 0 %
Immature Granulocytes: 0 %
Immature Granulocytes: 0 %
Immature Granulocytes: 0 %
Lymphocytes Relative: 23 %
Lymphocytes Relative: 20 %
Lymphocytes Relative: 20 %
Lymphocytes Relative: 22 %
Lymphs Abs: 1.4 10*3/uL (ref 0.7–4.0)
Lymphs Abs: 1 10*3/uL (ref 0.7–4.0)
Lymphs Abs: 1.2 10*3/uL (ref 0.7–4.0)
Lymphs Abs: 1.3 10*3/uL (ref 0.7–4.0)
MCH: 27 pg (ref 26.0–34.0)
MCH: 27 pg (ref 26.0–34.0)
MCH: 27.2 pg (ref 26.0–34.0)
MCH: 27 pg (ref 26.0–34.0)
MCHC: 32.5 g/dL (ref 30.0–36.0)
MCHC: 32.2 g/dL (ref 30.0–36.0)
MCHC: 31.9 g/dL (ref 30.0–36.0)
MCHC: 32.1 g/dL (ref 30.0–36.0)
MCV: 83.1 fL (ref 80.0–100.0)
MCV: 84 fL (ref 80.0–100.0)
MCV: 85.2 fL (ref 80.0–100.0)
MCV: 84.2 fL (ref 80.0–100.0)
Monocytes Absolute: 0.5 10*3/uL (ref 0.1–1.0)
Monocytes Absolute: 0.5 10*3/uL (ref 0.1–1.0)
Monocytes Absolute: 0.5 10*3/uL (ref 0.1–1.0)
Monocytes Absolute: 0.6 10*3/uL (ref 0.1–1.0)
Monocytes Relative: 9 %
Monocytes Relative: 9 %
Monocytes Relative: 9 %
Monocytes Relative: 11 %
Neutro Abs: 3.6 10*3/uL (ref 1.7–7.7)
Neutro Abs: 3.1 10*3/uL (ref 1.7–7.7)
Neutro Abs: 3.6 10*3/uL (ref 1.7–7.7)
Neutro Abs: 3.3 10*3/uL (ref 1.7–7.7)
Neutrophils Relative %: 61 %
Neutrophils Relative %: 61 %
Neutrophils Relative %: 61 %
Neutrophils Relative %: 56 %
Platelets: 99 10*3/uL — ABNORMAL LOW (ref 150–400)
Platelets: 104 10*3/uL — ABNORMAL LOW (ref 150–400)
Platelets: 104 10*3/uL — ABNORMAL LOW (ref 150–400)
Platelets: 102 10*3/uL — ABNORMAL LOW (ref 150–400)
RBC: 2.48 MIL/uL — ABNORMAL LOW (ref 3.87–5.11)
RBC: 2.44 MIL/uL — ABNORMAL LOW (ref 3.87–5.11)
RBC: 2.43 MIL/uL — ABNORMAL LOW (ref 3.87–5.11)
RBC: 2.78 MIL/uL — ABNORMAL LOW (ref 3.87–5.11)
RDW: 18.6 % — ABNORMAL HIGH (ref 11.5–15.5)
RDW: 18.6 % — ABNORMAL HIGH (ref 11.5–15.5)
RDW: 18.6 % — ABNORMAL HIGH (ref 11.5–15.5)
RDW: 18.6 % — ABNORMAL HIGH (ref 11.5–15.5)
WBC: 5.9 10*3/uL (ref 4.0–10.5)
WBC: 5.1 10*3/uL (ref 4.0–10.5)
WBC: 5.8 10*3/uL (ref 4.0–10.5)
WBC: 5.9 10*3/uL (ref 4.0–10.5)
nRBC: 0 % (ref 0.0–0.2)
nRBC: 0 % (ref 0.0–0.2)
nRBC: 0 % (ref 0.0–0.2)
nRBC: 0 % (ref 0.0–0.2)

## 2019-11-13 LAB — TYPE AND SCREEN
ABO/RH(D): O POS
Antibody Screen: NEGATIVE
Unit division: 0
Unit division: 0
Unit division: 0
Unit division: 0
Unit division: 0
Unit division: 0

## 2019-11-13 LAB — BPAM RBC
Blood Product Expiration Date: 202103022359
Blood Product Expiration Date: 202103032359
Blood Product Expiration Date: 202103172359
Blood Product Expiration Date: 202103172359
Blood Product Expiration Date: 202103172359
Blood Product Expiration Date: 202103192359
ISSUE DATE / TIME: 202102190606
ISSUE DATE / TIME: 202102190606
ISSUE DATE / TIME: 202102190822
ISSUE DATE / TIME: 202102190822
ISSUE DATE / TIME: 202102190907
ISSUE DATE / TIME: 202102190907
Unit Type and Rh: 5100
Unit Type and Rh: 5100
Unit Type and Rh: 5100
Unit Type and Rh: 5100
Unit Type and Rh: 9500
Unit Type and Rh: 9500

## 2019-11-13 LAB — PREPARE RBC (CROSSMATCH)

## 2019-11-13 LAB — GLUCOSE, CAPILLARY
Glucose-Capillary: 105 mg/dL — ABNORMAL HIGH (ref 70–99)
Glucose-Capillary: 66 mg/dL — ABNORMAL LOW (ref 70–99)
Glucose-Capillary: 70 mg/dL (ref 70–99)
Glucose-Capillary: 76 mg/dL (ref 70–99)
Glucose-Capillary: 92 mg/dL (ref 70–99)
Glucose-Capillary: 94 mg/dL (ref 70–99)
Glucose-Capillary: 95 mg/dL (ref 70–99)

## 2019-11-13 LAB — CORTISOL: Cortisol, Plasma: 6.1 ug/dL

## 2019-11-13 MED ORDER — PANCRELIPASE (LIP-PROT-AMYL) 12000-38000 UNITS PO CPEP: 3.0000 [IU] | ORAL_CAPSULE | Freq: Three times a day (TID) | ORAL | Status: DC

## 2019-11-13 MED ORDER — CITALOPRAM HYDROBROMIDE 20 MG PO TABS
20.0000 mg | ORAL_TABLET | Freq: Every day | ORAL | Status: DC
  Filled 2019-11-13 (×5): qty 1

## 2019-11-13 MED ORDER — SODIUM CHLORIDE 0.9% IV SOLUTION: Freq: Once | INTRAVENOUS | Status: DC

## 2019-11-13 MED ORDER — AMLODIPINE BESYLATE 10 MG PO TABS
10.0000 mg | ORAL_TABLET | Freq: Every day | ORAL | Status: DC
  Administered 2019-11-13 – 2019-11-14 (×2): 10 mg via ORAL
  Filled 2019-11-13 (×2): qty 1

## 2019-11-13 MED ORDER — EPOETIN ALFA 10000 UNIT/ML IJ SOLN
10000.0000 [IU] | INTRAMUSCULAR | Status: DC
  Administered 2019-11-14: 10000 [IU] via INTRAVENOUS

## 2019-11-13 MED ORDER — SEVELAMER CARBONATE 800 MG PO TABS
2400.0000 mg | ORAL_TABLET | Freq: Three times a day (TID) | ORAL | Status: DC
  Administered 2019-11-13 – 2019-11-18 (×12): 2400 mg via ORAL
  Filled 2019-11-13 (×12): qty 3

## 2019-11-13 MED ORDER — CEFAZOLIN SODIUM-DEXTROSE 1-4 GM/50ML-% IV SOLN
1.0000 g | INTRAVENOUS | Status: AC
  Administered 2019-11-14 (×2): 1 g via INTRAVENOUS
  Filled 2019-11-13 (×3): qty 50

## 2019-11-13 MED ORDER — FLUTICASONE PROPIONATE 50 MCG/ACT NA SUSP
2.0000 | Freq: Every day | NASAL | Status: DC
  Filled 2019-11-13: qty 16

## 2019-11-13 MED ORDER — SODIUM CHLORIDE 0.9% IV SOLUTION: Freq: Once | INTRAVENOUS | Status: AC

## 2019-11-13 MED ORDER — PANTOPRAZOLE SODIUM 40 MG PO TBEC
40.0000 mg | DELAYED_RELEASE_TABLET | Freq: Every day | ORAL | Status: DC
  Administered 2019-11-14 – 2019-11-18 (×4): 40 mg via ORAL
  Filled 2019-11-13 (×4): qty 1

## 2019-11-13 MED ORDER — ALBUTEROL SULFATE HFA 108 (90 BASE) MCG/ACT IN AERS: 2.0000 | INHALATION_SPRAY | RESPIRATORY_TRACT | Status: DC | PRN

## 2019-11-13 MED ORDER — ATORVASTATIN CALCIUM 20 MG PO TABS
20.0000 mg | ORAL_TABLET | Freq: Every day | ORAL | Status: DC
  Administered 2019-11-13 – 2019-11-18 (×6): 20 mg via ORAL
  Filled 2019-11-13 (×6): qty 1

## 2019-11-13 MED ORDER — GABAPENTIN 100 MG PO CAPS
200.0000 mg | ORAL_CAPSULE | Freq: Every day | ORAL | Status: DC
  Administered 2019-11-13 – 2019-11-15 (×3): 200 mg via ORAL
  Filled 2019-11-13 (×3): qty 2

## 2019-11-13 MED ORDER — HYDROCODONE-ACETAMINOPHEN 5-325 MG PO TABS
1.0000 | ORAL_TABLET | Freq: Four times a day (QID) | ORAL | Status: DC | PRN
  Administered 2019-11-13 – 2019-11-14 (×2): 1 via ORAL
  Filled 2019-11-13 (×2): qty 1

## 2019-11-13 MED ORDER — PANCRELIPASE (LIP-PROT-AMYL) 12000-38000 UNITS PO CPEP
36000.0000 [IU] | ORAL_CAPSULE | Freq: Three times a day (TID) | ORAL | Status: DC
  Administered 2019-11-13 – 2019-11-18 (×11): 36000 [IU] via ORAL
  Filled 2019-11-13 (×17): qty 3

## 2019-11-13 MED ORDER — CARVEDILOL 6.25 MG PO TABS
6.2500 mg | ORAL_TABLET | Freq: Two times a day (BID) | ORAL | Status: DC
  Administered 2019-11-13 – 2019-11-14 (×2): 6.25 mg via ORAL
  Filled 2019-11-13 (×2): qty 1

## 2019-11-13 MED ORDER — PANCRELIPASE (LIP-PROT-AMYL) 12000-38000 UNITS PO CPEP
24000.0000 [IU] | ORAL_CAPSULE | Freq: Three times a day (TID) | ORAL | Status: DC
  Administered 2019-11-13: 24000 [IU] via ORAL
  Filled 2019-11-13 (×2): qty 2

## 2019-11-13 NOTE — Progress Notes (Signed)
Pt transferred to RM #[id number] at this time. VSS prior to transfer. Pt alert and oriented X4. Report given to receiving RN.

## 2019-11-13 NOTE — Progress Notes (Addendum)
Progress Note    [name]  AGT:[id number] DOB: 06/05/1961  DOA: 11/11/2019 PCP: Freddy Finner, NP      Brief Narrative:    Medical records reviewed and are as summarized below:  [name] is an 59 y.o. female with end-stage renal disease on dialysis presented with bleeding from left upper extremity dialysis access.  She has end-stage renal disease on dialysis Monday Wednesday Friday.  Bleeding was apparently excessive and unable to control it.  EMS was activated and they had reported copious amount of blood at home.  They estimated about a couple of liters.  When they arrived patient was hypotensive, pale and lethargic.  She was minimally responsive.  H&H was 5.5 and 17.6 with hemorrhagic shock.  She was evaluated by a vascular surgery.  She was transfused 2 units and was taken to the OR emergently for management of the bleeding.  Patient required excision of the infected left arm brachial artery AV graft and placement of a wound VAC.  Patient was noted to be bradycardic during the procedure and it was elected to leave her intubated.  She was transferred to ICU for mechanical ventilation.  She was extubated on 11/11/2019.  Since she had profound bradycardia in the 40s cardiology was consulted.  2D echo showed EF estimated at 55 to 60% and grade 2 diastolic dysfunction. They thought possibly rate may improve after extubation.  Cardiologist thought this was a reflex in the setting of acute blood loss and hemorrhagic shock.  No indication for pacemaker.  Heart rate has improved.  Patient was subsequently transferred to the hospitalist service on 11/13/2019.      Assessment/Plan:   Active Problems:   Hemorrhagic shock (HCC)   ESRD on hemodialysis ([place])   Complication of vascular access for dialysis   PVD (peripheral vascular disease) (HCC)   Severe anemia  Acute hypoxemic respiratory failure: Resolved.  She was liberated from the ventilator on 11/11/2019.  Hemorrhagic shock:  Resolved  Acute blood loss anemia on chronic anemia: s/p transfusion with 2 units of packed red blood cells on 11/11/2019.  Patient will be transfused with total of 2 units of packed red blood cells today.  Monitor H&H.  Ruptured and infected left AV graft/ bleeding from left AV graft: S/p excision of left arm AVA graft on 11/12/2019.  Wound VAC has been applied to surgical wound.  Follow-up with vascular surgeon.  ESRD on hemodialysis on Mondays, Wednesdays and Fridays: Patient has a new nontunneled hemodialysis catheter in the right femoral vein.  PVD: Aspirin and Plavix on hold because of severe anemia  Hypertension: BP is elevated.  Resume home antihypertensives.  Type 2 diabetes mellitus with peripheral neuropathy: NovoLog as needed for hyperglycemia.  Continue gabapentin.  Body mass index is 35.18 kg/m.  (Morbid obesity)   Family Communication/Anticipated D/C date and plan/Code Status   DVT prophylaxis: SCDs Code Status: Full code Family Communication: Plan discussed with the patient Disposition Plan: Possible discharge to home in 1 to 2 days when anemia improves and she is cleared by vascular surgery.       Subjective:   C/o pain in the left arm.  No dizziness, shortness of breath  Objective:    Vitals:   11/13/19 0800 11/13/19 0900 11/13/19 1000 11/13/19 1109  BP: (!) 169/57 (!) 144/36 (!) 141/42 (!) 162/58  Pulse: 62 65 64 64  Resp: 14 10  18   Temp: 98.8 F (37.1 C) 99 F (37.2 C) 98.1 F (  36.7 C) 98.8 F (37.1 C)  TempSrc: Rectal   Oral  SpO2: 100% 100% 100% 100%  Weight:      Height:        Intake/Output Summary (Last 24 hours) at 11/13/2019 1342 Last data filed at 11/13/2019 0800 Gross per 24 hour  Intake 0 ml  Output 714 ml  Net -714 ml   Filed Weights   11/12/19 1954 11/12/19 2030 11/13/19 0324  Weight: 98.7 kg 98.7 kg 95.9 kg    Exam:  GEN: NAD SKIN: Wound VAC applied to surgical wound on left arm EYES: EOMI ENT: MMM CV: RRR PULM: CTA  B ABD: soft, ND, NT, +BS CNS: AAO x 3, non focal EXT: Left arm swelling and tenderness.  Bilateral leg swelling and tenderness (patient said this is chronic from neuropathy)   Data Reviewed:   I have personally reviewed following labs and imaging studies:  Labs: Labs show the following:   Basic Metabolic Panel: Recent Labs  Lab 11/11/19 0612 11/11/19 0612 11/11/19 1325 11/12/19 0600  NA 132*  --  132* 135  K 4.2   < > 4.1 4.3  CL 97*  --  98 101  CO2 23  --  24 24  GLUCOSE 151*  --  200* 58*  BUN 27*  --  27* 31*  CREATININE 7.24*  --  6.89* 7.71*  CALCIUM 7.0*  --  6.8* 7.2*  MG  --   --  2.0 2.0  PHOS  --   --   --  5.2*   < > = values in this interval not displayed.   GFR Estimated Creatinine Clearance: 8.9 mL/min (A) (by C-G formula based on SCr of 7.71 mg/dL (H)). Liver Function Tests: Recent Labs  Lab 11/11/19 0612 11/11/19 1325  AST 13* 15  ALT 9 9  ALKPHOS 49 41  BILITOT 0.8 1.1  PROT 4.4* 4.7*  ALBUMIN 2.2* 2.8*   No results for input(s): LIPASE, AMYLASE in the last 168 hours. No results for input(s): AMMONIA in the last 168 hours. Coagulation profile Recent Labs  Lab 11/11/19 0612 11/11/19 1132 11/12/19 0600  INR 1.4* 1.5* 1.4*    CBC: Recent Labs  Lab 11/12/19 0600 11/12/19 1400 11/12/19 1944 11/13/19 0149 11/13/19 0814  WBC 5.9 5.9 4.6 5.9 5.1  NEUTROABS 4.0 3.7 3.2 3.6 3.1  HGB 7.6* 7.0* 6.9* 6.7* 6.6*  HCT 22.3* 21.2* 20.5* 20.6* 20.5*  MCV 81.1 83.1 81.7 83.1 84.0  PLT 105* 103* 97* 99* 104*   Cardiac Enzymes: No results for input(s): CKTOTAL, CKMB, CKMBINDEX, TROPONINI in the last 168 hours. BNP (last 3 results) No results for input(s): PROBNP in the last 8760 hours. CBG: Recent Labs  Lab 11/12/19 2333 11/13/19 0358 11/13/19 0429 11/13/19 0814 11/13/19 1123  GLUCAP 79 66* 76 70 94   D-Dimer: No results for input(s): DDIMER in the last 72 hours. Hgb A1c: No results for input(s): HGBA1C in the last 72 hours. Lipid  Profile: No results for input(s): CHOL, HDL, LDLCALC, TRIG, CHOLHDL, LDLDIRECT in the last 72 hours. Thyroid function studies: Recent Labs    11/11/19 1325  TSH 1.333   Anemia work up: No results for input(s): VITAMINB12, FOLATE, FERRITIN, TIBC, IRON, RETICCTPCT in the last 72 hours. Sepsis Labs: Recent Labs  Lab 11/12/19 1400 11/12/19 1944 11/13/19 0149 11/13/19 0814  WBC 5.9 4.6 5.9 5.1    Microbiology Recent Results (from the past 240 hour(s))  Respiratory Panel by RT PCR (Flu A&B, Covid) -  Nasopharyngeal Swab     Status: None   Collection Time: 11/11/19  7:00 AM   Specimen: Nasopharyngeal Swab  Result Value Ref Range Status   SARS Coronavirus 2 by RT PCR NEGATIVE NEGATIVE Final    Comment: (NOTE) SARS-CoV-2 target nucleic acids are NOT DETECTED. The SARS-CoV-2 RNA is generally detectable in upper respiratoy specimens during the acute phase of infection. The lowest concentration of SARS-CoV-2 viral copies this assay can detect is 131 copies/mL. A negative result does not preclude SARS-Cov-2 infection and should not be used as the sole basis for treatment or other patient management decisions. A negative result may occur with  improper specimen collection/handling, submission of specimen other than nasopharyngeal swab, presence of viral mutation(s) within the areas targeted by this assay, and inadequate number of viral copies (<131 copies/mL). A negative result must be combined with clinical observations, patient history, and epidemiological information. The expected result is Negative. Fact Sheet for Patients:  PinkCheek.be Fact Sheet for Healthcare Providers:  GravelBags.it This test is not yet ap proved or cleared by the [place] FDA and  has been authorized for detection and/or diagnosis of SARS-CoV-2 by FDA under an Emergency Use Authorization (EUA). This EUA will remain  in effect (meaning this test  can be used) for the duration of the COVID-19 declaration under Section 564(b)(1) of the Act, 21 U.S.C. section 360bbb-3(b)(1), unless the authorization is terminated or revoked sooner.    Influenza A by PCR NEGATIVE NEGATIVE Final   Influenza B by PCR NEGATIVE NEGATIVE Final    Comment: (NOTE) The Xpert Xpress SARS-CoV-2/FLU/RSV assay is intended as an aid in  the diagnosis of influenza from Nasopharyngeal swab specimens and  should not be used as a sole basis for treatment. Nasal washings and  aspirates are unacceptable for Xpert Xpress SARS-CoV-2/FLU/RSV  testing. Fact Sheet for Patients: PinkCheek.be Fact Sheet for Healthcare Providers: GravelBags.it This test is not yet approved or cleared by the [place] FDA and  has been authorized for detection and/or diagnosis of SARS-CoV-2 by  FDA under an Emergency Use Authorization (EUA). This EUA will remain  in effect (meaning this test can be used) for the duration of the  Covid-19 declaration under Section 564(b)(1) of the Act, 21  U.S.C. section 360bbb-3(b)(1), unless the authorization is  terminated or revoked. Performed at [facility], [address] [place], [place] [address]   MRSA PCR Screening     Status: None   Collection Time: 11/11/19 11:31 AM   Specimen: Nasopharyngeal  Result Value Ref Range Status   MRSA by PCR NEGATIVE NEGATIVE Final    Comment:        The GeneXpert MRSA Assay (FDA approved for NASAL specimens only), is one component of a comprehensive MRSA colonization surveillance program. It is not intended to diagnose MRSA infection nor to guide or monitor treatment for MRSA infections. Performed at [facility], [address] [place], [place] [address]     Procedures and diagnostic studies:  ECHOCARDIOGRAM COMPLETE  Result Date: 11/12/2019    ECHOCARDIOGRAM REPORT   Patient Name:   SHANON SEAWRIGHT Date of Exam:  11/11/2019 Medical Rec #:  [id number]   Height:       63.0 in Accession #:    [id number]  Weight:       194.2 lb Date of Birth:  April 23, 1961    BSA:          1.91 m Patient Age:    1 years  BP:           105/50 mmHg Patient Gender: F           HR:           49 bpm. Exam Location:  [facility] Procedure: 2D Echo, Cardiac Doppler and Color Doppler Indications:     Acute Resp. Insufficiency  History:         Patient has no prior history of Echocardiogram examinations.  Sonographer:     Alyse Low Roar Referring Phys:  [id number] CARMEN Veda Canning Diagnosing Phys: Mertie Moores MD IMPRESSIONS  1. Left ventricular ejection fraction, by estimation, is 55 to 60%. The left ventricle has normal function. The left ventricle has no regional wall motion abnormalities. Left ventricular diastolic parameters are consistent with Grade II diastolic dysfunction (pseudonormalization).  2. Right ventricular systolic function is normal. The right ventricular size is normal. There is moderately elevated pulmonary artery systolic pressure.  3. Left atrial size was mildly dilated.  4. The mitral valve is normal in structure and function. No evidence of mitral valve regurgitation. No evidence of mitral stenosis.  5. The aortic valve is normal in structure and function. Aortic valve regurgitation is not visualized. No aortic stenosis is present. FINDINGS  Left Ventricle: Left ventricular ejection fraction, by estimation, is 55 to 60%. The left ventricle has normal function. The left ventricle has no regional wall motion abnormalities. The left ventricular internal cavity size was small. There is no left ventricular hypertrophy. Left ventricular diastolic parameters are consistent with Grade II diastolic dysfunction (pseudonormalization). Right Ventricle: The right ventricular size is normal. No increase in right ventricular wall thickness. Right ventricular systolic function is normal. There is moderately elevated pulmonary artery systolic pressure. The  tricuspid regurgitant velocity is 3.14 m/s, and with an assumed right atrial pressure of 10 mmHg, the estimated right ventricular systolic pressure is 20.2 mmHg. Left Atrium: Left atrial size was mildly dilated. Right Atrium: Right atrial size was normal in size. Pericardium: There is no evidence of pericardial effusion. Mitral Valve: The mitral valve is normal in structure and function. No evidence of mitral valve regurgitation. No evidence of mitral valve stenosis. Tricuspid Valve: The tricuspid valve is normal in structure. Tricuspid valve regurgitation is mild. Aortic Valve: The aortic valve is normal in structure and function. Aortic valve regurgitation is not visualized. No aortic stenosis is present. Aortic valve mean gradient measures 4.0 mmHg. Aortic valve peak gradient measures 9.4 mmHg. Aortic valve area, by VTI measures 1.94 cm. Pulmonic Valve: The pulmonic valve was normal in structure. Pulmonic valve regurgitation is mild. Aorta: The aortic root and ascending aorta are structurally normal, with no evidence of dilitation. IAS/Shunts: The atrial septum is grossly normal.  LEFT VENTRICLE PLAX 2D LVIDd:         4.45 cm  Diastology LVIDs:         3.34 cm  LV e' lateral:   7.29 cm/s LV PW:         1.04 cm  LV E/e' lateral: 14.7 LV IVS:        1.23 cm  LV e' medial:    5.77 cm/s LVOT diam:     1.80 cm  LV E/e' medial:  18.5 LV SV:         64.64 ml LV SV Index:   22.12 LVOT Area:     2.54 cm  RIGHT VENTRICLE RV Mid diam:    2.99 cm RV S prime:     11.20 cm/s LEFT ATRIUM  Index LA diam:      4.40 cm 2.30 cm/m LA Vol (A4C): 58.5 ml 30.63 ml/m  AORTIC VALVE                   PULMONIC VALVE AV Area (Vmax):    1.81 cm    PV Vmax:        0.91 m/s AV Area (Vmean):   2.14 cm    PV Peak grad:   3.3 mmHg AV Area (VTI):     1.94 cm    RVOT Peak grad: 2 mmHg AV Vmax:           153.00 cm/s AV Vmean:          93.700 cm/s AV VTI:            0.334 m AV Peak Grad:      9.4 mmHg AV Mean Grad:      4.0 mmHg LVOT  Vmax:         109.00 cm/s LVOT Vmean:        78.900 cm/s LVOT VTI:          0.254 m LVOT/AV VTI ratio: 0.76  AORTA Ao Root diam: 2.60 cm MITRAL VALVE                TRICUSPID VALVE MV Area (PHT): 2.55 cm     TR Peak grad:   39.4 mmHg MV Decel Time: 298 msec     TR Vmax:        314.00 cm/s MV E velocity: 107.00 cm/s MV A velocity: 110.00 cm/s  SHUNTS MV E/A ratio:  0.97         Systemic VTI:  0.25 m                             Systemic Diam: 1.80 cm Mertie Moores MD Electronically signed by Mertie Moores MD Signature Date/Time: 11/12/2019/2:05:31 PM    Final     Medications:   . sodium chloride   Intravenous Once  . sodium chloride   Intravenous Once  . amLODipine  10 mg Oral Daily  . atorvastatin  20 mg Oral Daily  . carvedilol  6.25 mg Oral BID  . Chlorhexidine Gluconate Cloth  6 each Topical Daily  . [START ON 11/14/2019] citalopram  20 mg Oral Daily  . fluticasone  2 spray Each Nare Daily  . gabapentin  200 mg Oral QHS  . influenza vac split quadrivalent PF  0.5 mL Intramuscular Tomorrow-1000  . lipase/protease/amylase  36,000 Units Oral TID WC  . [START ON 11/14/2019] pantoprazole  40 mg Oral Daily  . pneumococcal 23 valent vaccine  0.5 mL Intramuscular Tomorrow-1000  . sevelamer carbonate  2,400 mg Oral TID WC  . sodium chloride flush  10-40 mL Intracatheter Q12H   Continuous Infusions:   LOS: 2 days   Nyeisha Goodall  [facility]     11/13/2019, 1:42 PM

## 2019-11-13 NOTE — Progress Notes (Signed)
Patient complaining of feeling the urge to urinate. Patient has purewick and has not voided. Bladder scan performed. 168ml found in bladder. MD messaged for monitoring vs possible In/Out cath if recommended.

## 2019-11-13 NOTE — Progress Notes (Signed)
[facility]  ROUNDING NOTE   Subjective:   Left femoral temp HD catheter placed yesterday by Dr. Trula Slade.   Hemodialysis treatment yesterday. UF of 684mL. Tolerated treatment well.  Patient sitting up eating breakfast this morning.   Objective:  Vital signs in last 24 hours:  Temp:  [97.3 F (36.3 C)-99.7 F (37.6 C)] 98.8 F (37.1 C) (02/21 0800) Pulse Rate:  [57-71] 62 (02/21 0800) Resp:  [9-23] 14 (02/21 0800) BP: (122-169)/(43-73) 169/57 (02/21 0800) SpO2:  [89 %-100 %] 100 % (02/21 0800) Weight:  [95.9 kg-98.7 kg] 95.9 kg (02/21 0324)  Weight change: 8.6 kg Filed Weights   11/12/19 1954 11/12/19 2030 11/13/19 0324  Weight: 98.7 kg 98.7 kg 95.9 kg    Intake/Output: I/O last 3 completed shifts: In: 0  Out: 614 [Other:614]   Intake/Output this shift:  Total I/O In: -  Out: 100 [Drains:100]  Physical Exam: General: NAD, sitting up in bed  Head: [place]/AT, adentulous  Eyes: PERRL  Neck: Supple, trachea midline  Lungs:  Clear to auscultation     Heart: regular  Abdomen:  Soft, nontender, bowel sounds present  Extremities: 1+ peripheral edema, left arm with clean dressings. + wound vac  Neurologic: Alert and oriented  Skin: No lesions  Access: Left femoral temp HD catheter 2/20 Dr. Trula Slade    Basic Metabolic Panel: Recent Labs  Lab 11/11/19 0612 11/11/19 1325 11/12/19 0600  NA 132* 132* 135  K 4.2 4.1 4.3  CL 97* 98 101  CO2 23 24 24   GLUCOSE 151* 200* 58*  BUN 27* 27* 31*  CREATININE 7.24* 6.89* 7.71*  CALCIUM 7.0* 6.8* 7.2*  MG  --  2.0 2.0  PHOS  --   --  5.2*    Liver Function Tests: Recent Labs  Lab 11/11/19 0612 11/11/19 1325  AST 13* 15  ALT 9 9  ALKPHOS 49 41  BILITOT 0.8 1.1  PROT 4.4* 4.7*  ALBUMIN 2.2* 2.8*   No results for input(s): LIPASE, AMYLASE in the last 168 hours. No results for input(s): AMMONIA in the last 168 hours.  CBC: Recent Labs  Lab 11/12/19 0600 11/12/19 1400 11/12/19 1944 11/13/19 0149  11/13/19 0814  WBC 5.9 5.9 4.6 5.9 5.1  NEUTROABS 4.0 3.7 3.2 3.6 3.1  HGB 7.6* 7.0* 6.9* 6.7* 6.6*  HCT 22.3* 21.2* 20.5* 20.6* 20.5*  MCV 81.1 83.1 81.7 83.1 84.0  PLT 105* 103* 97* 99* 104*    Cardiac Enzymes: No results for input(s): CKTOTAL, CKMB, CKMBINDEX, TROPONINI in the last 168 hours.  BNP: Invalid input(s): POCBNP  CBG: Recent Labs  Lab 11/12/19 1925 11/12/19 2333 11/13/19 0358 11/13/19 0429 11/13/19 0814  GLUCAP 86 79 66* 76 108    Microbiology: Results for orders placed or performed during the hospital encounter of 11/11/19  Respiratory Panel by RT PCR (Flu A&B, Covid) - Nasopharyngeal Swab     Status: None   Collection Time: 11/11/19  7:00 AM   Specimen: Nasopharyngeal Swab  Result Value Ref Range Status   SARS Coronavirus 2 by RT PCR NEGATIVE NEGATIVE Final    Comment: (NOTE) SARS-CoV-2 target nucleic acids are NOT DETECTED. The SARS-CoV-2 RNA is generally detectable in upper respiratoy specimens during the acute phase of infection. The lowest concentration of SARS-CoV-2 viral copies this assay can detect is 131 copies/mL. A negative result does not preclude SARS-Cov-2 infection and should not be used as the sole basis for treatment or other patient management decisions. A negative result may occur  with  improper specimen collection/handling, submission of specimen other than nasopharyngeal swab, presence of viral mutation(s) within the areas targeted by this assay, and inadequate number of viral copies (<131 copies/mL). A negative result must be combined with clinical observations, patient history, and epidemiological information. The expected result is Negative. Fact Sheet for Patients:  PinkCheek.be Fact Sheet for Healthcare Providers:  GravelBags.it This test is not yet ap proved or cleared by the [place] FDA and  has been authorized for detection and/or diagnosis of SARS-CoV-2  by FDA under an Emergency Use Authorization (EUA). This EUA will remain  in effect (meaning this test can be used) for the duration of the COVID-19 declaration under Section 564(b)(1) of the Act, 21 U.S.C. section 360bbb-3(b)(1), unless the authorization is terminated or revoked sooner.    Influenza A by PCR NEGATIVE NEGATIVE Final   Influenza B by PCR NEGATIVE NEGATIVE Final    Comment: (NOTE) The Xpert Xpress SARS-CoV-2/FLU/RSV assay is intended as an aid in  the diagnosis of influenza from Nasopharyngeal swab specimens and  should not be used as a sole basis for treatment. Nasal washings and  aspirates are unacceptable for Xpert Xpress SARS-CoV-2/FLU/RSV  testing. Fact Sheet for Patients: PinkCheek.be Fact Sheet for Healthcare Providers: GravelBags.it This test is not yet approved or cleared by the [place] FDA and  has been authorized for detection and/or diagnosis of SARS-CoV-2 by  FDA under an Emergency Use Authorization (EUA). This EUA will remain  in effect (meaning this test can be used) for the duration of the  Covid-19 declaration under Section 564(b)(1) of the Act, 21  U.S.C. section 360bbb-3(b)(1), unless the authorization is  terminated or revoked. Performed at [facility], [address] [place], [place] [address]   MRSA PCR Screening     Status: None   Collection Time: 11/11/19 11:31 AM   Specimen: Nasopharyngeal  Result Value Ref Range Status   MRSA by PCR NEGATIVE NEGATIVE Final    Comment:        The GeneXpert MRSA Assay (FDA approved for NASAL specimens only), is one component of a comprehensive MRSA colonization surveillance program. It is not intended to diagnose MRSA infection nor to guide or monitor treatment for MRSA infections. Performed at [facility], [address] [place], [place] [address]     Coagulation Studies: Recent Labs    11/11/19 0612  11/11/19 1132 11/12/19 0600  LABPROT 17.2* 18.1* 17.1*  INR 1.4* 1.5* 1.4*    Urinalysis: No results for input(s): COLORURINE, LABSPEC, PHURINE, GLUCOSEU, HGBUR, BILIRUBINUR, KETONESUR, PROTEINUR, UROBILINOGEN, NITRITE, LEUKOCYTESUR in the last 72 hours.  Invalid input(s): APPERANCEUR    Imaging: DG Chest Port 1 View  Result Date: 11/11/2019 CLINICAL DATA:  Respiratory failure. EXAM: PORTABLE CHEST 1 VIEW COMPARISON:  09/30/2019. FINDINGS: Endotracheal tube noted with tip 5 cm above the carina. Heart size normal. Bilateral pulmonary infiltrates/edema. Small bilateral pleural effusion. No pneumothorax. Left subclavian stents noted. Surgical staples noted over the left chest. IMPRESSION: 1.  Endotracheal tube noted with tip 5 cm above the carina. 2.  Diffuse bilateral pulmonary infiltrates/edema. Electronically Signed   By: Marcello Moores  Register   On: 11/11/2019 12:00   ECHOCARDIOGRAM COMPLETE  Result Date: 11/12/2019    ECHOCARDIOGRAM REPORT   Patient Name:   [name] Date of Exam: 11/11/2019 Medical Rec #:  [id number]   Height:       63.0 in Accession #:    [id number]  Weight:  194.2 lb Date of Birth:  Jun 17, 1961    BSA:          1.91 m Patient Age:    58 years    BP:           105/50 mmHg Patient Gender: F           HR:           49 bpm. Exam Location:  [facility] Procedure: 2D Echo, Cardiac Doppler and Color Doppler Indications:     Acute Resp. Insufficiency  History:         Patient has no prior history of Echocardiogram examinations.  Sonographer:     Alyse Low Roar Referring Phys:  [id number] CARMEN Veda Canning Diagnosing Phys: Mertie Moores MD IMPRESSIONS  1. Left ventricular ejection fraction, by estimation, is 55 to 60%. The left ventricle has normal function. The left ventricle has no regional wall motion abnormalities. Left ventricular diastolic parameters are consistent with Grade II diastolic dysfunction (pseudonormalization).  2. Right ventricular systolic function is normal. The right ventricular  size is normal. There is moderately elevated pulmonary artery systolic pressure.  3. Left atrial size was mildly dilated.  4. The mitral valve is normal in structure and function. No evidence of mitral valve regurgitation. No evidence of mitral stenosis.  5. The aortic valve is normal in structure and function. Aortic valve regurgitation is not visualized. No aortic stenosis is present. FINDINGS  Left Ventricle: Left ventricular ejection fraction, by estimation, is 55 to 60%. The left ventricle has normal function. The left ventricle has no regional wall motion abnormalities. The left ventricular internal cavity size was small. There is no left ventricular hypertrophy. Left ventricular diastolic parameters are consistent with Grade II diastolic dysfunction (pseudonormalization). Right Ventricle: The right ventricular size is normal. No increase in right ventricular wall thickness. Right ventricular systolic function is normal. There is moderately elevated pulmonary artery systolic pressure. The tricuspid regurgitant velocity is 3.14 m/s, and with an assumed right atrial pressure of 10 mmHg, the estimated right ventricular systolic pressure is 37.8 mmHg. Left Atrium: Left atrial size was mildly dilated. Right Atrium: Right atrial size was normal in size. Pericardium: There is no evidence of pericardial effusion. Mitral Valve: The mitral valve is normal in structure and function. No evidence of mitral valve regurgitation. No evidence of mitral valve stenosis. Tricuspid Valve: The tricuspid valve is normal in structure. Tricuspid valve regurgitation is mild. Aortic Valve: The aortic valve is normal in structure and function. Aortic valve regurgitation is not visualized. No aortic stenosis is present. Aortic valve mean gradient measures 4.0 mmHg. Aortic valve peak gradient measures 9.4 mmHg. Aortic valve area, by VTI measures 1.94 cm. Pulmonic Valve: The pulmonic valve was normal in structure. Pulmonic valve  regurgitation is mild. Aorta: The aortic root and ascending aorta are structurally normal, with no evidence of dilitation. IAS/Shunts: The atrial septum is grossly normal.  LEFT VENTRICLE PLAX 2D LVIDd:         4.45 cm  Diastology LVIDs:         3.34 cm  LV e' lateral:   7.29 cm/s LV PW:         1.04 cm  LV E/e' lateral: 14.7 LV IVS:        1.23 cm  LV e' medial:    5.77 cm/s LVOT diam:     1.80 cm  LV E/e' medial:  18.5 LV SV:         64.64 ml LV SV Index:  22.12 LVOT Area:     2.54 cm  RIGHT VENTRICLE RV Mid diam:    2.99 cm RV S prime:     11.20 cm/s LEFT ATRIUM           Index LA diam:      4.40 cm 2.30 cm/m LA Vol (A4C): 58.5 ml 30.63 ml/m  AORTIC VALVE                   PULMONIC VALVE AV Area (Vmax):    1.81 cm    PV Vmax:        0.91 m/s AV Area (Vmean):   2.14 cm    PV Peak grad:   3.3 mmHg AV Area (VTI):     1.94 cm    RVOT Peak grad: 2 mmHg AV Vmax:           153.00 cm/s AV Vmean:          93.700 cm/s AV VTI:            0.334 m AV Peak Grad:      9.4 mmHg AV Mean Grad:      4.0 mmHg LVOT Vmax:         109.00 cm/s LVOT Vmean:        78.900 cm/s LVOT VTI:          0.254 m LVOT/AV VTI ratio: 0.76  AORTA Ao Root diam: 2.60 cm MITRAL VALVE                TRICUSPID VALVE MV Area (PHT): 2.55 cm     TR Peak grad:   39.4 mmHg MV Decel Time: 298 msec     TR Vmax:        314.00 cm/s MV E velocity: 107.00 cm/s MV A velocity: 110.00 cm/s  SHUNTS MV E/A ratio:  0.97         Systemic VTI:  0.25 m                             Systemic Diam: 1.80 cm Mertie Moores MD Electronically signed by Mertie Moores MD Signature Date/Time: 11/12/2019/2:05:31 PM    Final      Medications:    . sodium chloride   Intravenous Once  . Chlorhexidine Gluconate Cloth  6 each Topical Daily  . influenza vac split quadrivalent PF  0.5 mL Intramuscular Tomorrow-1000  . pantoprazole (PROTONIX) IV  40 mg Intravenous Daily  . pneumococcal 23 valent vaccine  0.5 mL Intramuscular Tomorrow-1000  . sodium chloride flush  10-40 mL  Intracatheter Q12H   fentaNYL (SUBLIMAZE) injection, HYDROcodone-acetaminophen, ipratropium-albuterol, ondansetron (ZOFRAN) IV, phenol, sodium chloride flush  Assessment/ Plan:  [name] is a 59 y.o. black female with end stage renal disease on hemodialysis for last fifteen years, pancreatic insufficiency, hypertension, peripheral vascular disease, GERD, diabetes mellitus type II,  with past medical history of ESRD on HD MWF, anemia of chronic kidney disease, COPD, hyperlipidemia who is admitted to [facility] on 11/11/2019 for Hemorrhagic shock ([place]) [R57.8] ESRD on hemodialysis ([place]) [X79.3, J03.0] Complication of arteriovenous dialysis fistula, initial encounter [T82.9XXA]  [facility] MWF [facility] 86.5kg  1.  ESRD on HD MWF.  With complication of dialysis device. Rupture of AVG ([place]). Status post ligation by Dr. Delana Meyer on 2/20. Suspected underlying graft infection, patient reports purulent drainage prior to admission.  - Dialysis treatment scheduled for tomorrow.  - Will need tunneled catheter  to be placed.  - Appreciate ICU and vascular input  2.  Hypovolemic shock with hypotension. Status post 2 units PRBC and 1 unit FFP on 2/19.  Scheduled for 1 unit PRBC today.  Hemodynamically stable at this time. No longer requiring vasopressors.   3.  Anemia of chronic kidney disease with acute blood loss Status post transfusions as above - EPO with HD treatment  4.  Secondary hyperparathyroidism:  - restart binders.   5. Pancreatic insufficiency: - resume Creon   LOS: 2 [name] 2/21/20219:30 AM

## 2019-11-13 NOTE — Progress Notes (Signed)
    Subjective  - POD #2  Complaining of pain in both of her arms Tolerated dialysis yesterday   Physical Exam:  Wound VAC is in place to the left upper arm.  Both arms are warm and well-perfused. She has a functioning catheter in the right groin   Assessment/Plan:  POD #2  We will plan for wound VAC change tomorrow Discussed proceeding with tunneled dialysis catheter tomorrow.  She will be n.p.o. after midnight  [name] 11/13/2019 3:02 PM --  Vitals:   11/13/19 1404 11/13/19 1440  BP: (!) 151/51 (!) 163/55  Pulse: 65 67  Resp: 19 17  Temp: 98.6 F (37 C) 98.5 F (36.9 C)  SpO2: 100% 100%    Intake/Output Summary (Last 24 hours) at 11/13/2019 1502 Last data filed at 11/13/2019 0800 Gross per 24 hour  Intake 0 ml  Output 714 ml  Net -714 ml     Laboratory CBC    Component Value Date/Time   WBC 5.8 11/13/2019 1334   HGB 6.6 (L) 11/13/2019 1334   HCT 20.7 (L) 11/13/2019 1334   PLT 104 (L) 11/13/2019 1334    BMET    Component Value Date/Time   NA 135 11/12/2019 0600   K 4.3 11/12/2019 0600   CL 101 11/12/2019 0600   CO2 24 11/12/2019 0600   GLUCOSE 58 (L) 11/12/2019 0600   BUN 31 (H) 11/12/2019 0600   CREATININE 7.71 (H) 11/12/2019 0600   CALCIUM 7.2 (L) 11/12/2019 0600   GFRNONAA 5 (L) 11/12/2019 0600   GFRAA 6 (L) 11/12/2019 0600    COAG Lab Results  Component Value Date   INR 1.4 (H) 11/12/2019   INR 1.5 (H) 11/11/2019   INR 1.4 (H) 11/11/2019   No results found for: PTT  Antibiotics Anti-infectives (From admission, onward)   Start     Dose/Rate Route Frequency Ordered Stop   11/12/19 0000  ceFAZolin (ANCEF) IVPB 1 g/50 mL premix    Note to Pharmacy: Send with pt to OR   1 g 100 mL/hr over 30 Minutes Intravenous On call 11/11/19 0751 11/13/19 0000       V. Leia Alf, M.D., [facility] [place] Office: [phone] Pager:  [phone]

## 2019-11-13 NOTE — Progress Notes (Signed)
CBG 66   Intervention: 8 oz apple juice PO  CBG 76

## 2019-11-14 ENCOUNTER — Encounter
Admission: EM | Disposition: A | Discharge status: Skilled Nursing Facility | Payer: Self-pay | Source: Home / Self Care | Attending: Internal Medicine

## 2019-11-14 ENCOUNTER — Other Ambulatory Visit (INDEPENDENT_AMBULATORY_CARE_PROVIDER_SITE_OTHER): Payer: Self-pay | Admitting: Vascular Surgery

## 2019-11-14 DIAGNOSIS — N186 End stage renal disease: Secondary | ICD-10-CM

## 2019-11-14 DIAGNOSIS — Z992 Dependence on renal dialysis: Secondary | ICD-10-CM

## 2019-11-14 HISTORY — PX: DIALYSIS/PERMA CATHETER INSERTION: CATH118288

## 2019-11-14 LAB — CBC
HCT: 23.2 % — ABNORMAL LOW (ref 36.0–46.0)
Hemoglobin: 7.5 g/dL — ABNORMAL LOW (ref 12.0–15.0)
MCH: 27.1 pg (ref 26.0–34.0)
MCHC: 32.3 g/dL (ref 30.0–36.0)
MCV: 83.8 fL (ref 80.0–100.0)
Platelets: 105 10*3/uL — ABNORMAL LOW (ref 150–400)
RBC: 2.77 MIL/uL — ABNORMAL LOW (ref 3.87–5.11)
RDW: 18.9 % — ABNORMAL HIGH (ref 11.5–15.5)
WBC: 5.9 10*3/uL (ref 4.0–10.5)
nRBC: 0 % (ref 0.0–0.2)

## 2019-11-14 LAB — CBC WITH DIFFERENTIAL/PLATELET
Abs Immature Granulocytes: 0.02 10*3/uL (ref 0.00–0.07)
Abs Immature Granulocytes: 0.02 10*3/uL (ref 0.00–0.07)
Abs Immature Granulocytes: 0.01 10*3/uL (ref 0.00–0.07)
Basophils Absolute: 0 10*3/uL (ref 0.0–0.1)
Basophils Absolute: 0 10*3/uL (ref 0.0–0.1)
Basophils Absolute: 0 10*3/uL (ref 0.0–0.1)
Basophils Relative: 0 %
Basophils Relative: 0 %
Basophils Relative: 0 %
Eosinophils Absolute: 0.6 10*3/uL — ABNORMAL HIGH (ref 0.0–0.5)
Eosinophils Absolute: 0.7 10*3/uL — ABNORMAL HIGH (ref 0.0–0.5)
Eosinophils Absolute: 0.6 10*3/uL — ABNORMAL HIGH (ref 0.0–0.5)
Eosinophils Relative: 11 %
Eosinophils Relative: 12 %
Eosinophils Relative: 11 %
HCT: 22.9 % — ABNORMAL LOW (ref 36.0–46.0)
HCT: 23.2 % — ABNORMAL LOW (ref 36.0–46.0)
HCT: 23.7 % — ABNORMAL LOW (ref 36.0–46.0)
Hemoglobin: 7.4 g/dL — ABNORMAL LOW (ref 12.0–15.0)
Hemoglobin: 7.3 g/dL — ABNORMAL LOW (ref 12.0–15.0)
Hemoglobin: 7.5 g/dL — ABNORMAL LOW (ref 12.0–15.0)
Immature Granulocytes: 0 %
Immature Granulocytes: 0 %
Immature Granulocytes: 0 %
Lymphocytes Relative: 22 %
Lymphocytes Relative: 21 %
Lymphocytes Relative: 19 %
Lymphs Abs: 1.3 10*3/uL (ref 0.7–4.0)
Lymphs Abs: 1.2 10*3/uL (ref 0.7–4.0)
Lymphs Abs: 1.1 10*3/uL (ref 0.7–4.0)
MCH: 27 pg (ref 26.0–34.0)
MCH: 26.6 pg (ref 26.0–34.0)
MCH: 26.9 pg (ref 26.0–34.0)
MCHC: 32.3 g/dL (ref 30.0–36.0)
MCHC: 31.5 g/dL (ref 30.0–36.0)
MCHC: 31.6 g/dL (ref 30.0–36.0)
MCV: 83.6 fL (ref 80.0–100.0)
MCV: 84.7 fL (ref 80.0–100.0)
MCV: 84.9 fL (ref 80.0–100.0)
Monocytes Absolute: 0.6 10*3/uL (ref 0.1–1.0)
Monocytes Absolute: 0.5 10*3/uL (ref 0.1–1.0)
Monocytes Absolute: 0.5 10*3/uL (ref 0.1–1.0)
Monocytes Relative: 11 %
Monocytes Relative: 9 %
Monocytes Relative: 8 %
Neutro Abs: 3.2 10*3/uL (ref 1.7–7.7)
Neutro Abs: 3.3 10*3/uL (ref 1.7–7.7)
Neutro Abs: 3.6 10*3/uL (ref 1.7–7.7)
Neutrophils Relative %: 56 %
Neutrophils Relative %: 58 %
Neutrophils Relative %: 62 %
Platelets: 102 10*3/uL — ABNORMAL LOW (ref 150–400)
Platelets: 103 10*3/uL — ABNORMAL LOW (ref 150–400)
Platelets: 103 10*3/uL — ABNORMAL LOW (ref 150–400)
RBC: 2.74 MIL/uL — ABNORMAL LOW (ref 3.87–5.11)
RBC: 2.74 MIL/uL — ABNORMAL LOW (ref 3.87–5.11)
RBC: 2.79 MIL/uL — ABNORMAL LOW (ref 3.87–5.11)
RDW: 18.9 % — ABNORMAL HIGH (ref 11.5–15.5)
RDW: 19.1 % — ABNORMAL HIGH (ref 11.5–15.5)
RDW: 18.6 % — ABNORMAL HIGH (ref 11.5–15.5)
WBC: 5.8 10*3/uL (ref 4.0–10.5)
WBC: 5.7 10*3/uL (ref 4.0–10.5)
WBC: 5.8 10*3/uL (ref 4.0–10.5)
nRBC: 0 % (ref 0.0–0.2)
nRBC: 0 % (ref 0.0–0.2)
nRBC: 0 % (ref 0.0–0.2)

## 2019-11-14 LAB — RENAL FUNCTION PANEL
Albumin: 2.4 g/dL — ABNORMAL LOW (ref 3.5–5.0)
Anion gap: 9 (ref 5–15)
BUN: 18 mg/dL (ref 6–20)
CO2: 32 mmol/L (ref 22–32)
Calcium: 7.7 mg/dL — ABNORMAL LOW (ref 8.9–10.3)
Chloride: 94 mmol/L — ABNORMAL LOW (ref 98–111)
Creatinine, Ser: 5.73 mg/dL — ABNORMAL HIGH (ref 0.44–1.00)
GFR calc Af Amer: 9 mL/min — ABNORMAL LOW (ref >60)
GFR calc non Af Amer: 7 mL/min — ABNORMAL LOW (ref >60)
Glucose, Bld: 105 mg/dL — ABNORMAL HIGH (ref 70–99)
Phosphorus: 4.5 mg/dL (ref 2.5–4.6)
Potassium: 4.4 mmol/L (ref 3.5–5.1)
Sodium: 135 mmol/L (ref 135–145)

## 2019-11-14 LAB — GLUCOSE, CAPILLARY
Glucose-Capillary: 71 mg/dL (ref 70–99)
Glucose-Capillary: 114 mg/dL — ABNORMAL HIGH (ref 70–99)
Glucose-Capillary: 68 mg/dL — ABNORMAL LOW (ref 70–99)
Glucose-Capillary: 146 mg/dL — ABNORMAL HIGH (ref 70–99)
Glucose-Capillary: 65 mg/dL — ABNORMAL LOW (ref 70–99)
Glucose-Capillary: 85 mg/dL (ref 70–99)
Glucose-Capillary: 121 mg/dL — ABNORMAL HIGH (ref 70–99)
Glucose-Capillary: 164 mg/dL — ABNORMAL HIGH (ref 70–99)

## 2019-11-14 LAB — PROTIME-INR
INR: 1.4 — ABNORMAL HIGH (ref 0.8–1.2)
Prothrombin Time: 17.4 seconds — ABNORMAL HIGH (ref 11.4–15.2)

## 2019-11-14 LAB — SURGICAL PATHOLOGY

## 2019-11-14 SURGERY — DIALYSIS/PERMA CATHETER INSERTION
Anesthesia: Moderate Sedation

## 2019-11-14 MED ORDER — IODIXANOL 320 MG/ML IV SOLN
INTRAVENOUS | Status: DC | PRN
  Administered 2019-11-14: 17:00:00 10 mL

## 2019-11-14 MED ORDER — OXYCODONE HCL 5 MG PO TABS
5.0000 mg | ORAL_TABLET | ORAL | Status: DC | PRN
  Administered 2019-11-15 – 2019-11-18 (×7): 5 mg via ORAL
  Filled 2019-11-14 (×8): qty 1

## 2019-11-14 MED ORDER — DEXTROSE 50 % IV SOLN
12.5000 g | INTRAVENOUS | Status: AC
  Administered 2019-11-14: 12.5 g via INTRAVENOUS
  Filled 2019-11-14: qty 50

## 2019-11-14 MED ORDER — MIDAZOLAM HCL 2 MG/2ML IJ SOLN
INTRAMUSCULAR | Status: DC | PRN
  Administered 2019-11-14: 0.5 mg via INTRAVENOUS
  Administered 2019-11-14: 1 mg via INTRAVENOUS

## 2019-11-14 MED ORDER — DEXTROSE 50 % IV SOLN
12.5000 g | INTRAVENOUS | Status: AC
  Administered 2019-11-14: 12.5 g via INTRAVENOUS

## 2019-11-14 MED ORDER — FENTANYL CITRATE (PF) 100 MCG/2ML IJ SOLN
INTRAMUSCULAR | Status: AC
  Filled 2019-11-14: qty 2

## 2019-11-14 MED ORDER — HEPARIN SODIUM (PORCINE) 5000 UNIT/ML IJ SOLN
INTRAMUSCULAR | Status: AC
  Filled 2019-11-14: qty 2

## 2019-11-14 MED ORDER — DEXTROSE 50 % IV SOLN
INTRAVENOUS | Status: AC
  Filled 2019-11-14: qty 50

## 2019-11-14 MED ORDER — FENTANYL CITRATE (PF) 100 MCG/2ML IJ SOLN
INTRAMUSCULAR | Status: DC | PRN
  Administered 2019-11-14: 25 ug via INTRAVENOUS
  Administered 2019-11-14: 50 ug via INTRAVENOUS

## 2019-11-14 MED ORDER — MIDAZOLAM HCL 5 MG/5ML IJ SOLN
INTRAMUSCULAR | Status: AC
  Filled 2019-11-14: qty 5

## 2019-11-14 MED ORDER — HEPARIN SODIUM (PORCINE) 10000 UNIT/ML IJ SOLN
INTRAMUSCULAR | Status: AC
  Filled 2019-11-14: qty 1

## 2019-11-14 MED ORDER — CHLORHEXIDINE GLUCONATE CLOTH 2 % EX PADS
6.0000 | MEDICATED_PAD | Freq: Every day | CUTANEOUS | Status: DC
  Administered 2019-11-14 – 2019-11-15 (×2): 6 via TOPICAL

## 2019-11-14 SURGICAL SUPPLY — 11 items
CANNULA 5F STIFF (CANNULA) ×2 IMPLANT
CATH PALINDROME RT-P 15FX19CM (CATHETERS) ×2 IMPLANT
COVER PROBE U/S 5X48 (MISCELLANEOUS) ×2 IMPLANT
DERMABOND ADVANCED (GAUZE/BANDAGES/DRESSINGS) ×1
DERMABOND ADVANCED .7 DNX12 (GAUZE/BANDAGES/DRESSINGS) ×1 IMPLANT
GUIDEWIRE SUPER STIFF .035X180 (WIRE) ×2 IMPLANT
PACK ANGIOGRAPHY (CUSTOM PROCEDURE TRAY) ×2 IMPLANT
SUT MNCRL 4-0 (SUTURE) ×1
SUT MNCRL 4-0 27XMFL (SUTURE) ×1
SUT PROLENE 0 CT 1 30 (SUTURE) ×2 IMPLANT
SUTURE MNCRL 4-0 27XMF (SUTURE) ×1 IMPLANT

## 2019-11-14 NOTE — Progress Notes (Signed)
[facility]  Daily Progress Note   Subjective: 11/11/19: 1.  Insertion of triple-lumen catheter left femoral vein with ultrasound guidance. 2.  Excision of infected left arm brachial axillary AV graft  Patient with left upper extremity incisional discomfort.   Objective: Vitals:   11/14/19 1640 11/14/19 1645 11/14/19 1650 11/14/19 1655  BP:      Pulse:      Resp:      Temp:      TempSrc:      SpO2: 100% 100% 100% 100%  Weight:      Height:        Intake/Output Summary (Last 24 hours) at 11/14/2019 1714 Last data filed at 11/14/2019 1240 Gross per 24 hour  Intake 10 ml  Output 1500 ml  Net -1490 ml   Physical Exam: A&Ox3, NAD CV: RRR Pulmonary: CTA Bilaterally Abdomen: Soft, Nontender, Nondistended Vascular:  Left Upper Extremity: OR VAC removed. Healthy granulation tissue noted in wound. Staples clean, dry and intact.     Document Information Photos    11/14/2019 17:07  Attached To:  Hospital Encounter on 11/11/19  Source Information Johnjoseph Rolfe, Abbe Amsterdam  [facility]      Document Information Photos    11/14/2019 17:07  Attached To:  Hospital Encounter on 11/11/19  Source Information Leonides Schanz  [facility]   Laboratory: CBC    Component Value Date/Time   WBC 5.8 11/14/2019 1439   HGB 7.5 (L) 11/14/2019 1439   HCT 23.7 (L) 11/14/2019 1439   PLT 103 (L) 11/14/2019 1439   BMET    Component Value Date/Time   NA 135 11/14/2019 0900   K 4.4 11/14/2019 0900   CL 94 (L) 11/14/2019 0900   CO2 32 11/14/2019 0900   GLUCOSE 105 (H) 11/14/2019 0900   BUN 18 11/14/2019 0900   CREATININE 5.73 (H) 11/14/2019 0900   CALCIUM 7.7 (L) 11/14/2019 0900   GFRNONAA 7 (L) 11/14/2019 0900   GFRAA 9 (L) 11/14/2019 0900   Assessment/Planning: The patient is a 59 year old female who presented to the [facility] emergency department on November 11, 2019 with an infected and hemorrhaging left  upper extremity dialysis access graft.  This was removed - POD#3  1) left upper extremity wound: OR Wound VAC removed today.  New VAC placed. Changes M-W-F.   2) ESRD: RIJ permcath to be placed today.  Discussed with Dr. Eber Hong Persephonie Hegwood PA-C 11/14/2019 5:14 PM

## 2019-11-14 NOTE — Progress Notes (Signed)
PT Cancellation Note  Patient Details Name: MARQUIS DILES MRN: [id number] DOB: [birth year]   Cancelled Treatment:    Reason Eval/Treat Not Completed: Other (comment)(Pt out of room at this time, PT to follow up as able.)  Lieutenant Diego PT, DPT 9:51 AM,11/14/19

## 2019-11-14 NOTE — Progress Notes (Signed)
[facility]  ROUNDING NOTE   Subjective:   Seen and examined on hemodialysis treatment. Tolerating treatment well.     HEMODIALYSIS FLOWSHEET:  Blood Flow Rate (mL/min): 350 mL/min Arterial Pressure (mmHg): -150 mmHg Venous Pressure (mmHg): 110 mmHg Transmembrane Pressure (mmHg): 70 mmHg Ultrafiltration Rate (mL/min): 730 mL/min Dialysate Flow Rate (mL/min): 600 ml/min Conductivity: Machine : 13.5 Conductivity: Machine : 13.5 Dialysis Fluid Bolus: Normal Saline Bolus Amount (mL): 250 mL Dialysate Change: 2K    Objective:  Vital signs in last 24 hours:  Temp:  [96.6 F (35.9 C)-98.6 F (37 C)] 96.6 F (35.9 C) (02/22 0855) Pulse Rate:  [55-67] 57 (02/22 1145) Resp:  [12-21] 12 (02/22 1145) BP: (131-163)/(42-59) 162/59 (02/22 1145) SpO2:  [100 %] 100 % (02/22 0855) Weight:  [88.8 kg] 88.8 kg (02/22 0500)  Weight change: -7.9 kg Filed Weights   11/12/19 2030 11/13/19 0324 11/14/19 0500  Weight: 98.7 kg 95.9 kg 88.8 kg    Intake/Output: I/O last 3 completed shifts: In: 320 [Blood:320] Out: 427 [Drains:110; Other:614]   Intake/Output this shift:  No intake/output data recorded.  Physical Exam: General: NAD, laying in bed  Head: [place]/AT, adentulous  Eyes: PERRL  Neck: Supple, trachea midline  Lungs:  Clear to auscultation     Heart: regular  Abdomen:  Soft, nontender, bowel sounds present  Extremities: 1+ peripheral edema, left arm with clean dressings. + wound vac  Neurologic: Alert and oriented  Skin: No lesions  Access: Left femoral temp HD catheter 2/20 Dr. Trula Slade    Basic Metabolic Panel: Recent Labs  Lab 11/11/19 0612 11/11/19 0612 11/11/19 1325 11/12/19 0600 11/14/19 0900  NA 132*  --  132* 135 135  K 4.2  --  4.1 4.3 4.4  CL 97*  --  98 101 94*  CO2 23  --  24 24 32  GLUCOSE 151*  --  200* 58* 105*  BUN 27*  --  27* 31* 18  CREATININE 7.24*  --  6.89* 7.71* 5.73*  CALCIUM 7.0*   < > 6.8* 7.2* 7.7*  MG  --   --  2.0 2.0  --    PHOS  --   --   --  5.2* 4.5   < > = values in this interval not displayed.    Liver Function Tests: Recent Labs  Lab 11/11/19 0612 11/11/19 1325 11/14/19 0900  AST 13* 15  --   ALT 9 9  --   ALKPHOS 49 41  --   BILITOT 0.8 1.1  --   PROT 4.4* 4.7*  --   ALBUMIN 2.2* 2.8* 2.4*   No results for input(s): LIPASE, AMYLASE in the last 168 hours. No results for input(s): AMMONIA in the last 168 hours.  CBC: Recent Labs  Lab 11/13/19 0814 11/13/19 0814 11/13/19 1334 11/13/19 2017 11/14/19 0308 11/14/19 0502 11/14/19 0827  WBC 5.1   < > 5.8 5.9 5.8 5.9 5.7  NEUTROABS 3.1  --  3.6 3.3 3.2  --  3.3  HGB 6.6*   < > 6.6* 7.5* 7.4* 7.5* 7.3*  HCT 20.5*   < > 20.7* 23.4* 22.9* 23.2* 23.2*  MCV 84.0   < > 85.2 84.2 83.6 83.8 84.7  PLT 104*   < > 104* 102* 102* 105* 103*   < > = values in this interval not displayed.    Cardiac Enzymes: No results for input(s): CKTOTAL, CKMB, CKMBINDEX, TROPONINI in the last 168 hours.  BNP: Invalid input(s): POCBNP  CBG: Recent Labs  Lab 11/13/19 1944 11/13/19 2348 11/14/19 0431 11/14/19 0733 11/14/19 0822  GLUCAP 92 95 71 68* 114*    Microbiology: Results for orders placed or performed during the hospital encounter of 11/11/19  Respiratory Panel by RT PCR (Flu A&B, Covid) - Nasopharyngeal Swab     Status: None   Collection Time: 11/11/19  7:00 AM   Specimen: Nasopharyngeal Swab  Result Value Ref Range Status   SARS Coronavirus 2 by RT PCR NEGATIVE NEGATIVE Final    Comment: (NOTE) SARS-CoV-2 target nucleic acids are NOT DETECTED. The SARS-CoV-2 RNA is generally detectable in upper respiratoy specimens during the acute phase of infection. The lowest concentration of SARS-CoV-2 viral copies this assay can detect is 131 copies/mL. A negative result does not preclude SARS-Cov-2 infection and should not be used as the sole basis for treatment or other patient management decisions. A negative result may occur with  improper  specimen collection/handling, submission of specimen other than nasopharyngeal swab, presence of viral mutation(s) within the areas targeted by this assay, and inadequate number of viral copies (<131 copies/mL). A negative result must be combined with clinical observations, patient history, and epidemiological information. The expected result is Negative. Fact Sheet for Patients:  PinkCheek.be Fact Sheet for Healthcare Providers:  GravelBags.it This test is not yet ap proved or cleared by the [place] FDA and  has been authorized for detection and/or diagnosis of SARS-CoV-2 by FDA under an Emergency Use Authorization (EUA). This EUA will remain  in effect (meaning this test can be used) for the duration of the COVID-19 declaration under Section 564(b)(1) of the Act, 21 U.S.C. section 360bbb-3(b)(1), unless the authorization is terminated or revoked sooner.    Influenza A by PCR NEGATIVE NEGATIVE Final   Influenza B by PCR NEGATIVE NEGATIVE Final    Comment: (NOTE) The Xpert Xpress SARS-CoV-2/FLU/RSV assay is intended as an aid in  the diagnosis of influenza from Nasopharyngeal swab specimens and  should not be used as a sole basis for treatment. Nasal washings and  aspirates are unacceptable for Xpert Xpress SARS-CoV-2/FLU/RSV  testing. Fact Sheet for Patients: PinkCheek.be Fact Sheet for Healthcare Providers: GravelBags.it This test is not yet approved or cleared by the [place] FDA and  has been authorized for detection and/or diagnosis of SARS-CoV-2 by  FDA under an Emergency Use Authorization (EUA). This EUA will remain  in effect (meaning this test can be used) for the duration of the  Covid-19 declaration under Section 564(b)(1) of the Act, 21  U.S.C. section 360bbb-3(b)(1), unless the authorization is  terminated or revoked. Performed at [facility], [address] [place], [place] [address]   MRSA PCR Screening     Status: None   Collection Time: 11/11/19 11:31 AM   Specimen: Nasopharyngeal  Result Value Ref Range Status   MRSA by PCR NEGATIVE NEGATIVE Final    Comment:        The GeneXpert MRSA Assay (FDA approved for NASAL specimens only), is one component of a comprehensive MRSA colonization surveillance program. It is not intended to diagnose MRSA infection nor to guide or monitor treatment for MRSA infections. Performed at [facility], [address] [place],  [address]     Coagulation Studies: Recent Labs    11/12/19 0600 11/14/19 0502  LABPROT 17.1* 17.4*  INR 1.4* 1.4*    Urinalysis: No results for input(s): COLORURINE, LABSPEC, PHURINE, GLUCOSEU, HGBUR, BILIRUBINUR, KETONESUR, PROTEINUR, UROBILINOGEN, NITRITE, LEUKOCYTESUR in the last 72  hours.  Invalid input(s): APPERANCEUR    Imaging: No results found.   Medications:   .  ceFAZolin (ANCEF) IV     . amLODipine  10 mg Oral Daily  . atorvastatin  20 mg Oral Daily  . carvedilol  6.25 mg Oral BID  . citalopram  20 mg Oral Daily  . epoetin (EPOGEN/PROCRIT) injection  10,000 Units Intravenous Q M,W,F-HD  . fluticasone  2 spray Each Nare Daily  . gabapentin  200 mg Oral QHS  . influenza vac split quadrivalent PF  0.5 mL Intramuscular Tomorrow-1000  . lipase/protease/amylase  36,000 Units Oral TID WC  . pantoprazole  40 mg Oral Daily  . pneumococcal 23 valent vaccine  0.5 mL Intramuscular Tomorrow-1000  . sevelamer carbonate  2,400 mg Oral TID WC  . sodium chloride flush  10-40 mL Intracatheter Q12H   fentaNYL (SUBLIMAZE) injection, HYDROcodone-acetaminophen, ipratropium-albuterol, ondansetron (ZOFRAN) IV, phenol, sodium chloride flush  Assessment/ Plan:  [name] is a 59 y.o. black female with end stage renal disease on hemodialysis for last fifteen years, pancreatic insufficiency, hypertension, peripheral  vascular disease, GERD, diabetes mellitus type II,  with past medical history of ESRD on HD MWF, anemia of chronic kidney disease, COPD, hyperlipidemia who is admitted to [facility] on 11/11/2019 for Hemorrhagic shock ([place]) [R57.8] ESRD on hemodialysis ([place]) [P37.9, K24.0] Complication of arteriovenous dialysis fistula, initial encounter [T82.9XXA]  [facility] MWF [facility] 86.5kg  1.  ESRD on HD MWF: seen and examined on hemodialysis treatment. Tolerating treatment well.  With complication of dialysis device. Rupture of AVG ([place]). Status post ligation by Dr. Delana Meyer on 2/20. Suspected underlying graft infection, patient reports purulent drainage prior to admission. No cultures were sent - Will need tunneled catheter to be placed.  - Appreciate ICU and vascular input  2.  Hypertension. Weaned off vasopressors. Restarted amlodipine and  Carvedilol.    3.  Anemia of chronic kidney disease with acute blood loss. Status post 2 units PRBC and 1 unit FFP on 2/19. 1 unit PRBC on 2/21. Status post transfusions as above - EPO with HD treatment  4.  Secondary hyperparathyroidism:  - sevelamer    LOS: 3 [name] 2/22/202111:55 AM

## 2019-11-14 NOTE — Progress Notes (Signed)
Hemodialysis patient known at [facility] MWF 11:00. Spoke with patient today and stated that she would like to transfer back to old to clinic. DC will start working on this transfer.

## 2019-11-14 NOTE — Progress Notes (Signed)
Of note, the patient has stents in her left subclavian and innominate vein which would likely preclude left internal jugular PermCath placement.  Her right internal jugular vein was small and appeared to be occluded.  An external jugular PermCath was placed, but if this fails she may need a femoral access in the future.

## 2019-11-14 NOTE — Progress Notes (Signed)
Progress Note    [name]  IRJ:[id number] DOB: Nov 02, 1960  DOA: 11/11/2019 PCP: Freddy Finner, NP      Brief Narrative:    Medical records reviewed and are as summarized below:  [name] is an 59 y.o. female with end-stage renal disease on dialysis presented with bleeding from left upper extremity dialysis access.  She has end-stage renal disease on dialysis Monday Wednesday Friday.  Bleeding was apparently excessive and unable to control it.  EMS was activated and they had reported copious amount of blood at home.  They estimated about a couple of liters.  When they arrived patient was hypotensive, pale and lethargic.  She was minimally responsive.  H&H was 5.5 and 17.6 with hemorrhagic shock.  She was evaluated by a vascular surgery.  She was transfused 2 units and was taken to the OR emergently for management of the bleeding.  Patient required excision of the infected left arm brachial artery AV graft and placement of a wound VAC.  Patient was noted to be bradycardic during the procedure and it was elected to leave her intubated.  She was transferred to ICU for mechanical ventilation.  She was extubated on 11/11/2019.  Since she had profound bradycardia in the 40s cardiology was consulted.  2D echo showed EF estimated at 55 to 60% and grade 2 diastolic dysfunction. They thought possibly rate may improve after extubation.  Cardiologist thought this was a reflex in the setting of acute blood loss and hemorrhagic shock.  No indication for pacemaker.  Heart rate has improved.  Patient was subsequently transferred to the hospitalist service on 11/13/2019.      Assessment/Plan:   Active Problems:   Hemorrhagic shock (HCC)   ESRD on hemodialysis ([place])   Complication of vascular access for dialysis   PVD (peripheral vascular disease) (HCC)   Severe anemia   Pressure injury of skin  Acute hypoxemic respiratory failure: Resolved.  She was liberated from the ventilator on  11/11/2019.  Hemorrhagic shock: Resolved  Acute blood loss anemia on chronic anemia: s/p transfusion with total of 4 units of packed red blood cells.  H&H is stable.  Continue to monitor.  Ruptured and infected left AV graft/ bleeding from left AV graft: S/p excision of left arm AVA graft on 11/12/2019.  Wound VAC has been applied to surgical wound.  Follow-up with vascular surgeon.  ESRD on hemodialysis on Mondays, Wednesdays and Fridays: Patient has a new nontunneled hemodialysis catheter in the right femoral vein.  Plan for tunneled hemodialysis catheter today.  PVD: Aspirin and Plavix on hold because of severe anemia  Hypertension: Continue antihypertensives.  Type 2 diabetes mellitus with peripheral neuropathy: NovoLog as needed for hyperglycemia.  Continue gabapentin.  Body mass index is 32.58 kg/m.  (Morbid obesity)   Family Communication/Anticipated D/C date and plan/Code Status   DVT prophylaxis: SCDs Code Status: Full code Family Communication: Plan discussed with the patient Disposition Plan: Possible discharge to home in 1 to 2 days when anemia improves and she is cleared by vascular surgery.       Subjective:   She complains of hunger and she is frustrated about the delay in surgery for new hemodialysis vascular access.  She still has pain in her left arm.     Objective:    Vitals:   11/14/19 1130 11/14/19 1145 11/14/19 1201 11/14/19 1250  BP: (!) 156/51 (!) 162/59 (!) 154/53 (!) 146/43  Pulse: (!) 59 (!) 57 (!) 56 63  Resp: 16 12 16 14   Temp:   98.2 F (36.8 C) 98.6 F (37 C)  TempSrc:   Oral Oral  SpO2:   100% 100%  Weight:      Height:        Intake/Output Summary (Last 24 hours) at 11/14/2019 1504 Last data filed at 11/14/2019 1240 Gross per 24 hour  Intake 330 ml  Output 1510 ml  Net -1180 ml   Filed Weights   11/12/19 2030 11/13/19 0324 11/14/19 0500  Weight: 98.7 kg 95.9 kg 88.8 kg    Exam:  GEN: NAD SKIN: Wound VAC applied to  surgical wound on left arm EYES: No pallor or icterus ENT: MMM CV: RRR PULM: No wheezing or rales heard ABD: soft, ND, NT, +BS CNS: AAO x 3, non focal EXT: Left arm swelling and tenderness.  Bilateral leg swelling and tenderness (patient said this is chronic from neuropathy)   Data Reviewed:   I have personally reviewed following labs and imaging studies:  Labs: Labs show the following:   Basic Metabolic Panel: Recent Labs  Lab 11/11/19 0612 11/11/19 0612 11/11/19 1325 11/11/19 1325 11/12/19 0600 11/14/19 0900  NA 132*  --  132*  --  135 135  K 4.2   < > 4.1   < > 4.3 4.4  CL 97*  --  98  --  101 94*  CO2 23  --  24  --  24 32  GLUCOSE 151*  --  200*  --  58* 105*  BUN 27*  --  27*  --  31* 18  CREATININE 7.24*  --  6.89*  --  7.71* 5.73*  CALCIUM 7.0*  --  6.8*  --  7.2* 7.7*  MG  --   --  2.0  --  2.0  --   PHOS  --   --   --   --  5.2* 4.5   < > = values in this interval not displayed.   GFR Estimated Creatinine Clearance: 11.5 mL/min (A) (by C-G formula based on SCr of 5.73 mg/dL (H)). Liver Function Tests: Recent Labs  Lab 11/11/19 0612 11/11/19 1325 11/14/19 0900  AST 13* 15  --   ALT 9 9  --   ALKPHOS 49 41  --   BILITOT 0.8 1.1  --   PROT 4.4* 4.7*  --   ALBUMIN 2.2* 2.8* 2.4*   No results for input(s): LIPASE, AMYLASE in the last 168 hours. No results for input(s): AMMONIA in the last 168 hours. Coagulation profile Recent Labs  Lab 11/11/19 0612 11/11/19 1132 11/12/19 0600 11/14/19 0502  INR 1.4* 1.5* 1.4* 1.4*    CBC: Recent Labs  Lab 11/13/19 0814 11/13/19 0814 11/13/19 1334 11/13/19 2017 11/14/19 0308 11/14/19 0502 11/14/19 0827  WBC 5.1   < > 5.8 5.9 5.8 5.9 5.7  NEUTROABS 3.1  --  3.6 3.3 3.2  --  3.3  HGB 6.6*   < > 6.6* 7.5* 7.4* 7.5* 7.3*  HCT 20.5*   < > 20.7* 23.4* 22.9* 23.2* 23.2*  MCV 84.0   < > 85.2 84.2 83.6 83.8 84.7  PLT 104*   < > 104* 102* 102* 105* 103*   < > = values in this interval not displayed.    Cardiac Enzymes: No results for input(s): CKTOTAL, CKMB, CKMBINDEX, TROPONINI in the last 168 hours. BNP (last 3 results) No results for input(s): PROBNP in the last 8760 hours. CBG: Recent Labs  Lab 11/14/19 0431  11/14/19 0733 11/14/19 0822 11/14/19 1231 11/14/19 1304  GLUCAP 71 68* 114* 65* 146*   D-Dimer: No results for input(s): DDIMER in the last 72 hours. Hgb A1c: No results for input(s): HGBA1C in the last 72 hours. Lipid Profile: No results for input(s): CHOL, HDL, LDLCALC, TRIG, CHOLHDL, LDLDIRECT in the last 72 hours. Thyroid function studies: No results for input(s): TSH, T4TOTAL, T3FREE, THYROIDAB in the last 72 hours.  Invalid input(s): FREET3 Anemia work up: No results for input(s): VITAMINB12, FOLATE, FERRITIN, TIBC, IRON, RETICCTPCT in the last 72 hours. Sepsis Labs: Recent Labs  Lab 11/13/19 2017 11/14/19 0308 11/14/19 0502 11/14/19 0827  WBC 5.9 5.8 5.9 5.7    Microbiology Recent Results (from the past 240 hour(s))  Respiratory Panel by RT PCR (Flu A&B, Covid) - Nasopharyngeal Swab     Status: None   Collection Time: 11/11/19  7:00 AM   Specimen: Nasopharyngeal Swab  Result Value Ref Range Status   SARS Coronavirus 2 by RT PCR NEGATIVE NEGATIVE Final    Comment: (NOTE) SARS-CoV-2 target nucleic acids are NOT DETECTED. The SARS-CoV-2 RNA is generally detectable in upper respiratoy specimens during the acute phase of infection. The lowest concentration of SARS-CoV-2 viral copies this assay can detect is 131 copies/mL. A negative result does not preclude SARS-Cov-2 infection and should not be used as the sole basis for treatment or other patient management decisions. A negative result may occur with  improper specimen collection/handling, submission of specimen other than nasopharyngeal swab, presence of viral mutation(s) within the areas targeted by this assay, and inadequate number of viral copies (<131 copies/mL). A negative result must be  combined with clinical observations, patient history, and epidemiological information. The expected result is Negative. Fact Sheet for Patients:  PinkCheek.be Fact Sheet for Healthcare Providers:  GravelBags.it This test is not yet ap proved or cleared by the [place] FDA and  has been authorized for detection and/or diagnosis of SARS-CoV-2 by FDA under an Emergency Use Authorization (EUA). This EUA will remain  in effect (meaning this test can be used) for the duration of the COVID-19 declaration under Section 564(b)(1) of the Act, 21 U.S.C. section 360bbb-3(b)(1), unless the authorization is terminated or revoked sooner.    Influenza A by PCR NEGATIVE NEGATIVE Final   Influenza B by PCR NEGATIVE NEGATIVE Final    Comment: (NOTE) The Xpert Xpress SARS-CoV-2/FLU/RSV assay is intended as an aid in  the diagnosis of influenza from Nasopharyngeal swab specimens and  should not be used as a sole basis for treatment. Nasal washings and  aspirates are unacceptable for Xpert Xpress SARS-CoV-2/FLU/RSV  testing. Fact Sheet for Patients: PinkCheek.be Fact Sheet for Healthcare Providers: GravelBags.it This test is not yet approved or cleared by the [place] FDA and  has been authorized for detection and/or diagnosis of SARS-CoV-2 by  FDA under an Emergency Use Authorization (EUA). This EUA will remain  in effect (meaning this test can be used) for the duration of the  Covid-19 declaration under Section 564(b)(1) of the Act, 21  U.S.C. section 360bbb-3(b)(1), unless the authorization is  terminated or revoked. Performed at [facility], [address] [place], [place] [address]   MRSA PCR Screening     Status: None   Collection Time: 11/11/19 11:31 AM   Specimen: Nasopharyngeal  Result Value Ref Range Status   MRSA by PCR NEGATIVE NEGATIVE Final     Comment:        The GeneXpert MRSA Assay (FDA approved for  NASAL specimens only), is one component of a comprehensive MRSA colonization surveillance program. It is not intended to diagnose MRSA infection nor to guide or monitor treatment for MRSA infections. Performed at [facility], [address] [place], [place] [address]     Procedures and diagnostic studies:  No results found.  Medications:   . amLODipine  10 mg Oral Daily  . atorvastatin  20 mg Oral Daily  . carvedilol  6.25 mg Oral BID  . Chlorhexidine Gluconate Cloth  6 each Topical Daily  . citalopram  20 mg Oral Daily  . epoetin (EPOGEN/PROCRIT) injection  10,000 Units Intravenous Q M,W,F-HD  . fluticasone  2 spray Each Nare Daily  . gabapentin  200 mg Oral QHS  . influenza vac split quadrivalent PF  0.5 mL Intramuscular Tomorrow-1000  . lipase/protease/amylase  36,000 Units Oral TID WC  . pantoprazole  40 mg Oral Daily  . pneumococcal 23 valent vaccine  0.5 mL Intramuscular Tomorrow-1000  . sevelamer carbonate  2,400 mg Oral TID WC  . sodium chloride flush  10-40 mL Intracatheter Q12H   Continuous Infusions: .  ceFAZolin (ANCEF) IV       LOS: 3 days   Davarion Cuffee  [facility]     11/14/2019, 3:04 PM

## 2019-11-14 NOTE — Op Note (Signed)
OPERATIVE NOTE    PRE-OPERATIVE DIAGNOSIS: 1. ESRD 2.  Recent removal of left arm AV graft for bleeding  POST-OPERATIVE DIAGNOSIS: same as above  PROCEDURE: 1. Ultrasound guidance for vascular access to the right external jugular vein 2. Right jugular venogram and superior venacavogram 3. Fluoroscopic guidance for placement of catheter 4. Placement of a 19 cm tip to cuff tunneled hemodialysis catheter via the right external jugular vein  SURGEON: Leotis Pain, MD  ANESTHESIA:  Local with Moderate conscious sedation for approximately 30 minutes using 1.5 mg of Versed and 75 mcg of Fentanyl  ESTIMATED BLOOD LOSS: 75 cc  FLUORO TIME: 1.4 minutes  CONTRAST: 10 cc  FINDING(S): 1.  Patent right external jugular vein which drained down into the right innominate vein and superior vena cava which were patent.  The right internal jugular vein was occluded.  The left innominate and subclavian veins had stents placed which would preclude a left internal jugular catheter  SPECIMEN(S):  None  INDICATIONS:   [name] is a 59 y.o.female who presents with a bleeding AV graft that had to be resected a few days ago.  She no longer has adequate extremity access.  The patient needs long term dialysis access for their ESRD, and a Permcath is necessary.  Risks and benefits are discussed and informed consent is obtained.    DESCRIPTION: After obtaining full informed written consent, the patient was brought back to the vascular suited. The patient's right neck and chest were sterilely prepped and draped in a sterile surgical field was created. Moderate conscious sedation was administered during a face to face encounter with the patient throughout the procedure with my supervision of the RN administering medicines and monitoring the patient's vital signs, pulse oximetry, telemetry and mental status throughout from the start of the procedure until the patient was taken to the recovery room.  The right  internal jugular vein was small and appeared to occlude in the neck.  She had a previous PermCath on the side in the past.  The right external jugular vein was visualized with ultrasound and found to be patent. It was then accessed under direct ultrasound guidance with a micropuncture needle and a micropuncture wire was placed and a permanent image was recorded.  A micropuncture sheath was placed over the wire and the wire was removed.  I elected to perform a venogram through the micropuncture sheath which demonstrated the right external jugular vein to be widely patent and draining directly down and somewhat straight into the innominate vein and then superior vena cava which were widely patent.  Of note, the left innominate vein and subclavian veins had stents which would likely preclude any options for a left jugular PermCath.  This did appear to be a usable conduit for PermCath.  A wire was placed through the micropuncture sheath and down through the atrium into the IVC. After skin nick and dilatation, the peel-away sheath was placed over the wire. I then turned my attention to an area under the clavicle. Approximately 1-2 fingerbreadths below the clavicle a small counterincision was created and tunneled from the subclavicular incision to the access site. Using fluoroscopic guidance, a 19 centimeter tip to cuff tunneled hemodialysis catheter was selected, and tunneled from the subclavicular incision to the access site. It was then placed through the peel-away sheath and the peel-away sheath was removed. Using fluoroscopic guidance the catheter tips were parked in the right atrium. The appropriate distal connectors were placed. It withdrew blood well and  flushed easily with heparinized saline and a concentrated heparin solution was then placed. It was secured to the chest wall with 2 Prolene sutures. The access incision was closed single 4-0 Monocryl. A 4-0 Monocryl pursestring suture was placed around the exit  site. Sterile dressings were placed. The patient tolerated the procedure well and was taken to the recovery room in stable condition.  COMPLICATIONS: None  CONDITION: Stable  Leotis Pain, MD 11/14/2019 5:12 PM   This note was created with Dragon Medical transcription system. Any errors in dictation are purely unintentional.

## 2019-11-15 ENCOUNTER — Encounter: Payer: Self-pay | Admitting: Cardiology

## 2019-11-15 ENCOUNTER — Inpatient Hospital Stay: Payer: Medicare Other

## 2019-11-15 DIAGNOSIS — D62 Acute posthemorrhagic anemia: Secondary | ICD-10-CM

## 2019-11-15 LAB — HEPATITIS B SURFACE ANTIGEN: Hepatitis B Surface Ag: NEGATIVE — AB

## 2019-11-15 LAB — GLUCOSE, CAPILLARY
Glucose-Capillary: 173 mg/dL — ABNORMAL HIGH (ref 70–99)
Glucose-Capillary: 133 mg/dL — ABNORMAL HIGH (ref 70–99)
Glucose-Capillary: 115 mg/dL — ABNORMAL HIGH (ref 70–99)
Glucose-Capillary: 70 mg/dL (ref 70–99)
Glucose-Capillary: 114 mg/dL — ABNORMAL HIGH (ref 70–99)
Glucose-Capillary: 149 mg/dL — ABNORMAL HIGH (ref 70–99)

## 2019-11-15 LAB — RENAL FUNCTION PANEL
Albumin: 2.5 g/dL — ABNORMAL LOW (ref 3.5–5.0)
Anion gap: 7 (ref 5–15)
BUN: 11 mg/dL (ref 6–20)
CO2: 32 mmol/L (ref 22–32)
Calcium: 7.9 mg/dL — ABNORMAL LOW (ref 8.9–10.3)
Chloride: 97 mmol/L — ABNORMAL LOW (ref 98–111)
Creatinine, Ser: 3.98 mg/dL — ABNORMAL HIGH (ref 0.44–1.00)
GFR calc Af Amer: 13 mL/min — ABNORMAL LOW (ref >60)
GFR calc non Af Amer: 12 mL/min — ABNORMAL LOW (ref >60)
Glucose, Bld: 115 mg/dL — ABNORMAL HIGH (ref 70–99)
Phosphorus: 3.2 mg/dL (ref 2.5–4.6)
Potassium: 3.9 mmol/L (ref 3.5–5.1)
Sodium: 136 mmol/L (ref 135–145)

## 2019-11-15 LAB — CBC WITH DIFFERENTIAL/PLATELET
Abs Immature Granulocytes: 0.02 10*3/uL (ref 0.00–0.07)
Basophils Absolute: 0 10*3/uL (ref 0.0–0.1)
Basophils Relative: 0 %
Eosinophils Absolute: 0.7 10*3/uL — ABNORMAL HIGH (ref 0.0–0.5)
Eosinophils Relative: 12 %
HCT: 22.8 % — ABNORMAL LOW (ref 36.0–46.0)
Hemoglobin: 7.1 g/dL — ABNORMAL LOW (ref 12.0–15.0)
Immature Granulocytes: 0 %
Lymphocytes Relative: 20 %
Lymphs Abs: 1.2 10*3/uL (ref 0.7–4.0)
MCH: 27 pg (ref 26.0–34.0)
MCHC: 31.1 g/dL (ref 30.0–36.0)
MCV: 86.7 fL (ref 80.0–100.0)
Monocytes Absolute: 0.4 10*3/uL (ref 0.1–1.0)
Monocytes Relative: 7 %
Neutro Abs: 3.6 10*3/uL (ref 1.7–7.7)
Neutrophils Relative %: 61 %
Platelets: 108 10*3/uL — ABNORMAL LOW (ref 150–400)
RBC: 2.63 MIL/uL — ABNORMAL LOW (ref 3.87–5.11)
RDW: 18.2 % — ABNORMAL HIGH (ref 11.5–15.5)
WBC: 5.9 10*3/uL (ref 4.0–10.5)
nRBC: 0 % (ref 0.0–0.2)

## 2019-11-15 LAB — CBC
HCT: 23.1 % — ABNORMAL LOW (ref 36.0–46.0)
Hemoglobin: 7.1 g/dL — ABNORMAL LOW (ref 12.0–15.0)
MCH: 26.8 pg (ref 26.0–34.0)
MCHC: 30.7 g/dL (ref 30.0–36.0)
MCV: 87.2 fL (ref 80.0–100.0)
Platelets: 109 10*3/uL — ABNORMAL LOW (ref 150–400)
RBC: 2.65 MIL/uL — ABNORMAL LOW (ref 3.87–5.11)
RDW: 18.3 % — ABNORMAL HIGH (ref 11.5–15.5)
WBC: 5.2 10*3/uL (ref 4.0–10.5)
nRBC: 0 % (ref 0.0–0.2)

## 2019-11-15 LAB — HEMOGLOBIN A1C
Hgb A1c MFr Bld: 4.7 % — ABNORMAL LOW (ref 4.8–5.6)
Mean Plasma Glucose: 88.19 mg/dL

## 2019-11-15 LAB — HIV ANTIBODY (ROUTINE TESTING W REFLEX): HIV Screen 4th Generation wRfx: NONREACTIVE — AB

## 2019-11-15 MED ORDER — CEFAZOLIN SODIUM-DEXTROSE 2-4 GM/100ML-% IV SOLN: 2.0000 g | INTRAVENOUS | Status: DC

## 2019-11-15 MED ORDER — INSULIN ASPART 100 UNIT/ML ~~LOC~~ SOLN: 0.0000 [IU] | Freq: Three times a day (TID) | SUBCUTANEOUS | Status: DC

## 2019-11-15 MED ORDER — INSULIN ASPART 100 UNIT/ML ~~LOC~~ SOLN: 0.0000 [IU] | Freq: Every day | SUBCUTANEOUS | Status: DC

## 2019-11-15 MED ORDER — EPOETIN ALFA 10000 UNIT/ML IJ SOLN
10000.0000 [IU] | INTRAMUSCULAR | Status: DC
  Administered 2019-11-15: 10000 [IU] via INTRAVENOUS

## 2019-11-15 MED ORDER — SODIUM CHLORIDE 0.9 % IV SOLN
INTRAVENOUS | Status: DC | PRN
  Administered 2019-11-15: 30 mL via INTRAVENOUS
  Administered 2019-11-16: 250 mL via INTRAVENOUS

## 2019-11-15 MED ORDER — CEFAZOLIN SODIUM-DEXTROSE 1-4 GM/50ML-% IV SOLN
1.0000 g | INTRAVENOUS | Status: DC
  Administered 2019-11-15 – 2019-11-18 (×4): 1 g via INTRAVENOUS
  Filled 2019-11-15 (×5): qty 50

## 2019-11-15 MED ORDER — CHLORHEXIDINE GLUCONATE CLOTH 2 % EX PADS
6.0000 | MEDICATED_PAD | Freq: Every day | CUTANEOUS | Status: DC
  Administered 2019-11-16 – 2019-11-17 (×2): 6 via TOPICAL

## 2019-11-15 NOTE — Progress Notes (Signed)
Pre HD Tx Assessment   11/15/19 1035  Neurological  Level of Consciousness Alert  Orientation Level Oriented to person;Oriented to place  Respiratory  Respiratory Pattern Regular  Chest Assessment Chest expansion symmetrical  Bilateral Breath Sounds Clear;Fine crackles  R Upper  Breath Sounds Clear  L Upper Breath Sounds Clear  R Lower Breath Sounds Fine crackles  L Lower Breath Sounds Fine crackles  Cough None  Cardiac  Pulse Regular  Heart Sounds S1, S2  Jugular Venous Distention (JVD) No  ECG Monitor Yes  Vascular  R Radial Pulse +2  L Radial Pulse +2  R Dorsalis Pedis Pulse +1  L Dorsalis Pedis Pulse +1  Edema Right upper extremity;Left upper extremity;Right lower extremity;Left lower extremity  RUE Edema +2  LUE Edema +2  RLE Edema +2  LLE Edema +2  Integumentary  Integumentary (WDL) X  Skin Color Appropriate for ethnicity  Skin Condition Dry  Skin Integrity Cracking;Fissure  Cracking Location Leg  Cracking Location Orientation Bilateral  Musculoskeletal  Musculoskeletal (WDL) X  Generalized Weakness Yes  Gastrointestinal  Bowel Sounds Assessment Hypoactive  Last BM Date 11/11/19  GU Assessment  Genitourinary (WDL) X  Genitourinary Symptoms Oliguria (ESRD Pt)  Psychosocial  Psychosocial (WDL) WDL

## 2019-11-15 NOTE — Care Management Important Message (Signed)
Important Message  Patient Details  Name: [name] MRN: [id number] Date of Birth: [birth year]   Medicare Important Message Given:  Yes  Initial Medicare IM given by Patient Access Associate on 11/14/2019 at 12:41pm.     Dannette Barbara 11/15/2019, 8:27 AM

## 2019-11-15 NOTE — Progress Notes (Signed)
Hd Tx Started W/out Complications   78/37/54 1046  Vital Signs  Pulse Rate (!) 54  Resp (!) 9  BP (!) 149/56  Oxygen Therapy  SpO2 100 %  O2 Device Nasal Cannula  O2 Flow Rate (L/min) 2 L/min  During Hemodialysis Assessment  Blood Flow Rate (mL/min) 400 mL/min  Arterial Pressure (mmHg) -150 mmHg  Venous Pressure (mmHg) 140 mmHg  Transmembrane Pressure (mmHg) 60 mmHg  Ultrafiltration Rate (mL/min) 670 mL/min  Dialysate Flow Rate (mL/min) 600 ml/min  Conductivity: Machine  13.9  HD Safety Checks Performed Yes  Dialysis Fluid Bolus Normal Saline  Bolus Amount (mL) 250 mL  Intra-Hemodialysis Comments Tx initiated

## 2019-11-15 NOTE — Progress Notes (Signed)
Pharmacy Antibiotic Note  [name] is a 59 y.o. female admitted on 11/11/2019. Concern for infection of AV graft. Patient s/p removal of graft. Pharmacy has been consulted for cefazolin dosing.  Plan: Cefazolin 1 g IV q24h until HD schedule returns to normal. Then can consider switching to dosing with each HD session for easier administration especially if patient to continue on antibiotics for extended time.  Height: 5\' 5"  (165.1 cm) Weight: 195 lb 12.3 oz (88.8 kg) IBW/kg (Calculated) : 57  Temp (24hrs), Avg:97.9 F (36.6 C), Min:97.4 F (36.3 C), Max:98.5 F (36.9 C)  Recent Labs  Lab 11/11/19 0612 11/11/19 0820 11/11/19 1325 11/11/19 1958 11/12/19 0600 11/12/19 1400 11/14/19 0502 11/14/19 0827 11/14/19 0900 11/14/19 1439 11/15/19 0518 11/15/19 0957  WBC 4.6   < > 8.4   < > 5.9   < > 5.9 5.7  --  5.8 5.9 5.2  CREATININE 7.24*  --  6.89*  --  7.71*  --   --   --  5.73*  --   --  3.98*   < > = values in this interval not displayed.    Estimated Creatinine Clearance: 16.5 mL/min (A) (by C-G formula based on SCr of 3.98 mg/dL (H)).    Allergies  Allergen Reactions  . Dilaudid [Hydromorphone]   . Ondansetron     Antimicrobials this admission: Cefazolin 2/22 >>  Dose adjustments this admission: NA  Microbiology results: 2/19 MRSA PCR: negative  Thank you for allowing pharmacy to be a part of this patient's care.  Tawnya Crook, PharmD 11/15/2019 2:55 PM

## 2019-11-15 NOTE — Progress Notes (Signed)
[name] MRN: [id number] DOB: 12/10/1960   Cancelled Treatment:    Reason Eval/Treat Not Completed: Other (comment). Evaluation attempted however pt just starting delayed lunch due to HD schedule and prefers to eat at this time. Also reports being very fatigued, having back to back HD sessions this week and another scheduled for tomorrow. Politely refuses therapy this date. Of note, R temp HD cath still intact, OOB mobility contraindicated until removal. Will re-attempt next date if appropriate.   Caliann Leckrone 11/15/2019, 4:10 PM  [name], PT, DPT [phone]

## 2019-11-15 NOTE — Consult Note (Signed)
WOC Nurse Consult Note: Reason for Consult:NPWT VAC therapy dressing change to left arm brachial axillary AV graft site that was infectious.  Wound type: infectious/surgical Pressure Injury POA: NA Measurement: not assessed  Dressing change 11/16/19 and will be M/W/F going forward Wound bed:not assessed Drainage (amount, consistency, odor) serosanguinous in canister Message left for vascular team to page [place] nurse to coordinate [facility] dressing change tomorrow.  PLEASE page number below to coordinate time for 11/16/19 AM.  [place] team will follow   Domenic Moras MSN, RN, FNP-BC CWON Wound, Ostomy, Continence Nurse Pager [phone]

## 2019-11-15 NOTE — Progress Notes (Signed)
Pre HD TX Note Pt arrived form her room to receive routine HD Tx. Pt is A*2 on 2L O2 SPO2 100%. BP WDL. Right Perm CVC WDL. HD Tx is planned to run for 3hrs on 2K2.5Ca. Will keep Monitoring  11/15/19 1045  Hand-Off documentation  Report given to (Full Name) Newt Minion Rn   Report received from (Full Name) Darylene Price RN   Vital Signs  Temp 98.2 F (36.8 C)  Temp Source Oral  Pulse Rate (!) 54  Pulse Rate Source Monitor  Resp 10  BP (!) 149/56  BP Location Right Arm  BP Method Automatic  Patient Position (if appropriate) Lying  Oxygen Therapy  SpO2 100 %  O2 Device Nasal Cannula  O2 Flow Rate (L/min) 2 L/min  Pain Assessment  Pain Scale 0-10  Pain Score 0  Dialysis Weight  Weight 88.8 kg  Type of Weight Pre-Dialysis  Time-Out for Hemodialysis  What Procedure? Hd  Pt Identifiers(min of two) MRN/Account#;First/Last Name  Correct Site? Yes  Correct Side? Yes  Correct Procedure? Yes  Consents Verified? Yes  Safety Precautions Reviewed? Yes  Engineer, civil (consulting) Number 2  Station Number 2  UF/Alarm Test Passed  Conductivity: Meter 14  Conductivity: Machine  13.8  pH 7.2  Reverse Osmosis Main  Normal Saline Lot Number [id number]  Dialyzer Lot Number [id number]  Disposable Set Lot Number [id number]  Dialysate Acid Bath Lot Number [id number]  Dialysate HCO3 Bath Lot Number [id number]  Machine Temperature 98.6 F (37 C)  Musician and Audible Yes  Blood Lines Intact and Secured Yes  Pre Treatment Patient Checks  Vascular access used during treatment Catheter  HD catheter dressing before treatment WDL  Patient is receiving dialysis in a chair  (In BEd)  Hepatitis B Surface Antigen Results Negative  Date Hepatitis B Surface Antigen Drawn 10/18/19  Hepatitis B Surface Antibody  (<10)  Date Hepatitis B Surface Antibody Drawn 10/18/19  Hemodialysis Consent Verified Yes  Hemodialysis Standing Orders Initiated Yes  ECG (Telemetry) Monitor On Yes  Prime Ordered Normal Saline   Length of  DialysisTreatment -hour(s) 3 Hour(s)  Dialysis Treatment Comments  (Na140)  Dialyzer Elisio 17H NR  Dialysate 2K;2.5 Ca  Dialysis Anticoagulant None  Dialysate Flow Ordered 600  Blood Flow Rate Ordered 400 mL/min  Ultrafiltration Goal 2.5 Liters  Pre Treatment Labs Renal panel;CBC;Hepatitis B Surface Antigen  Dialysis Blood Pressure Support Ordered Albumin  Hemodialysis Catheter Right Subclavian  Placement Date/Time: 11/14/19 1648   Time Out: Correct patient;Correct site;Correct procedure  Maximum sterile barrier precautions: Hand hygiene;Cap;Mask;Sterile gown;Sterile gloves;Large sterile sheet  Site Prep: Chlorhexidine (preferred)  Local Anes...  Site Condition No complications  Blue Lumen Status Infusing;Flushed;Blood return noted  Red Lumen Status Infusing;Flushed;Blood return noted  Purple Lumen Status N/A  Catheter fill solution Heparin 1000 units/ml  Dressing Type Occlusive  Dressing Status Clean;Dry;Intact  Drainage Description None  Dressing Change Due 11/16/19

## 2019-11-15 NOTE — Progress Notes (Signed)
[facility] Daily Progress Note   Subjective:   11/11/19: 1.  Insertion of triple-lumen catheter left femoral vein with ultrasound guidance. 2.  Excision of infected left arm brachial axillary AV graft  11/14/19: 1. Ultrasound guidance for vascular access to the right external jugular vein 2. Right jugular venogram and superior venacavogram 3. Fluoroscopic guidance for placement of catheter 4. Placement of a 19 cm tip to cuff tunneled hemodialysis catheter via the right external jugular vein  Without complaint this AM. Continued left upper extremity wound discomfort.   Objective: Vitals:   11/14/19 2054 11/15/19 0407 11/15/19 0811 11/15/19 0824  BP: (!) 139/54 (!) 141/46 (!) 135/31 (!) 145/58  Pulse: (!) 56 (!) 55 (!) 55 (!) 55  Resp: 18 18 16    Temp: 98 F (36.7 C) (!) 97.4 F (36.3 C)    TempSrc: Oral Oral    SpO2: 100% 100% 100% 100%  Weight:      Height:        Intake/Output Summary (Last 24 hours) at 11/15/2019 1020 Last data filed at 11/15/2019 1003 Gross per 24 hour  Intake 20 ml  Output 1500 ml  Net -1480 ml   Physical Exam: A&Ox3, NAD Chest:   Right External Jugular Permcath: Intact, clean and dry.  CV: RRR Pulmonary: CTA Bilaterally Abdomen: Soft, Nontender, Nondistended Vascular:  Left Upper Extremity: Arm soft, VAC intact and to suction. Proximal and distal incision - staples clean and dry.    Laboratory: CBC    Component Value Date/Time   WBC 5.9 11/15/2019 0518   HGB 7.1 (L) 11/15/2019 0518   HCT 22.8 (L) 11/15/2019 0518   PLT 108 (L) 11/15/2019 0518   BMET    Component Value Date/Time   NA 135 11/14/2019 0900   K 4.4 11/14/2019 0900   CL 94 (L) 11/14/2019 0900   CO2 32 11/14/2019 0900   GLUCOSE 105 (H) 11/14/2019 0900   BUN 18 11/14/2019 0900   CREATININE 5.73 (H) 11/14/2019 0900   CALCIUM 7.7 (L) 11/14/2019 0900   GFRNONAA 7 (L) 11/14/2019 0900   GFRAA 9 (L) 11/14/2019 0900   Assessment/Planning: The patient  is a 59 year old female who presented to the [facility] emergency department on November 11, 2019 with an infected and hemorrhaging left upper extremity dialysis access graft.  This was removed - (11/11/19), right external jugular permcath placed - (11/14/19)  1) Left Upper Extremity Wound: VAC changes M-W-F Will plan on meeting wound nurse at 1pm tomorrow  2) ESRD: Permcath placed in right external jugular vein If fails, will need femoral permcath  Discussed with Dr. Eber Hong Morse Brueggemann PA-C 11/15/2019 10:20 AM

## 2019-11-15 NOTE — Progress Notes (Signed)
OT Cancellation Note  Patient Details Name: [name] MRN: [id number] DOB: 09-Apr-1961   Cancelled Treatment:    Reason Eval/Treat Not Completed: Medical issues which prohibited therapy Thank you for the OT consult. Order received and chart reviewed. Per chart review, pt noted to have downtrending hemoglobin (7.1) and hematocrit (22.8) lab values this AM. Per therapy protocols, will hold until medically stable and able to participate in therapy services. Will follow acutely and initiate services as pt medically appropriate.    Dessie Coma, M.S. OTR/L  11/15/19, 8:44 AM

## 2019-11-15 NOTE — Progress Notes (Signed)
[facility]  ROUNDING NOTE   Subjective:   Short of breath this morning. Extra treatment for today.   Permcath placed yesterday afternoon    HEMODIALYSIS FLOWSHEET:  Blood Flow Rate (mL/min): 400 mL/min Arterial Pressure (mmHg): -140 mmHg Venous Pressure (mmHg): 140 mmHg Transmembrane Pressure (mmHg): 60 mmHg Ultrafiltration Rate (mL/min): 1000 mL/min Dialysate Flow Rate (mL/min): 600 ml/min Conductivity: Machine : 13.9 Conductivity: Machine : 13.9 Dialysis Fluid Bolus: Normal Saline Bolus Amount (mL): 250 mL Dialysate Change: 2K    Objective:  Vital signs in last 24 hours:  Temp:  [97.4 F (36.3 C)-98.6 F (37 C)] 98.2 F (36.8 C) (02/23 1045) Pulse Rate:  [50-63] 53 (02/23 1215) Resp:  [9-18] 11 (02/23 1215) BP: (127-163)/(31-82) 127/44 (02/23 1215) SpO2:  [93 %-100 %] 100 % (02/23 1215) Weight:  [88.8 kg] 88.8 kg (02/23 1045)  Weight change:  Filed Weights   11/13/19 0324 11/14/19 0500 11/15/19 1045  Weight: 95.9 kg 88.8 kg 88.8 kg    Intake/Output: I/O last 3 completed shifts: In: 10 [I.V.:10] Out: 1500 [Other:1500]   Intake/Output this shift:  Total I/O In: 10 [I.V.:10] Out: -   Physical Exam: General: NAD, laying in bed  Head: [place]/AT, adentulous  Eyes: PERRL  Neck: Supple, trachea midline  Lungs:  Clear to auscultation     Heart: regular  Abdomen:  Soft, nontender, bowel sounds present  Extremities: 1+ peripheral edema, left arm with clean dressings. + wound vac  Neurologic: Alert and oriented  Skin: No lesions  Access: Left femoral temp HD catheter 2/20 Dr. Nickola Major permcath 2/22 Dr. Lucky Cowboy    Basic Metabolic Panel: Recent Labs  Lab 11/11/19 0612 11/11/19 0612 11/11/19 1325 11/11/19 1325 11/12/19 0600 11/14/19 0900 11/15/19 0957  NA 132*  --  132*  --  135 135 136  K 4.2  --  4.1  --  4.3 4.4 3.9  CL 97*  --  98  --  101 94* 97*  CO2 23  --  24  --  24 32 32  GLUCOSE 151*  --  200*  --  58* 105* 115*  BUN 27*  --   27*  --  31* 18 11  CREATININE 7.24*  --  6.89*  --  7.71* 5.73* 3.98*  CALCIUM 7.0*   < > 6.8*   < > 7.2* 7.7* 7.9*  MG  --   --  2.0  --  2.0  --   --   PHOS  --   --   --   --  5.2* 4.5 3.2   < > = values in this interval not displayed.    Liver Function Tests: Recent Labs  Lab 11/11/19 0612 11/11/19 1325 11/14/19 0900 11/15/19 0957  AST 13* 15  --   --   ALT 9 9  --   --   ALKPHOS 49 41  --   --   BILITOT 0.8 1.1  --   --   PROT 4.4* 4.7*  --   --   ALBUMIN 2.2* 2.8* 2.4* 2.5*   No results for input(s): LIPASE, AMYLASE in the last 168 hours. No results for input(s): AMMONIA in the last 168 hours.  CBC: Recent Labs  Lab 11/13/19 2017 11/13/19 2017 11/14/19 0308 11/14/19 0308 11/14/19 0502 11/14/19 0827 11/14/19 1439 11/15/19 0518 11/15/19 0957  WBC 5.9   < > 5.8   < > 5.9 5.7 5.8 5.9 5.2  NEUTROABS 3.3  --  3.2  --   --  3.3 3.6 3.6  --   HGB 7.5*   < > 7.4*   < > 7.5* 7.3* 7.5* 7.1* 7.1*  HCT 23.4*   < > 22.9*   < > 23.2* 23.2* 23.7* 22.8* 23.1*  MCV 84.2   < > 83.6   < > 83.8 84.7 84.9 86.7 87.2  PLT 102*   < > 102*   < > 105* 103* 103* 108* 109*   < > = values in this interval not displayed.    Cardiac Enzymes: No results for input(s): CKTOTAL, CKMB, CKMBINDEX, TROPONINI in the last 168 hours.  BNP: Invalid input(s): POCBNP  CBG: Recent Labs  Lab 11/14/19 1823 11/14/19 2050 11/15/19 0011 11/15/19 0407 11/15/19 0803  GLUCAP 121* 164* 173* 133* 115*    Microbiology: Results for orders placed or performed during the hospital encounter of 11/11/19  Respiratory Panel by RT PCR (Flu A&B, Covid) - Nasopharyngeal Swab     Status: None   Collection Time: 11/11/19  7:00 AM   Specimen: Nasopharyngeal Swab  Result Value Ref Range Status   SARS Coronavirus 2 by RT PCR NEGATIVE NEGATIVE Final    Comment: (NOTE) SARS-CoV-2 target nucleic acids are NOT DETECTED. The SARS-CoV-2 RNA is generally detectable in upper respiratoy specimens during the acute  phase of infection. The lowest concentration of SARS-CoV-2 viral copies this assay can detect is 131 copies/mL. A negative result does not preclude SARS-Cov-2 infection and should not be used as the sole basis for treatment or other patient management decisions. A negative result may occur with  improper specimen collection/handling, submission of specimen other than nasopharyngeal swab, presence of viral mutation(s) within the areas targeted by this assay, and inadequate number of viral copies (<131 copies/mL). A negative result must be combined with clinical observations, patient history, and epidemiological information. The expected result is Negative. Fact Sheet for Patients:  PinkCheek.be Fact Sheet for Healthcare Providers:  GravelBags.it This test is not yet ap proved or cleared by the [place] FDA and  has been authorized for detection and/or diagnosis of SARS-CoV-2 by FDA under an Emergency Use Authorization (EUA). This EUA will remain  in effect (meaning this test can be used) for the duration of the COVID-19 declaration under Section 564(b)(1) of the Act, 21 U.S.C. section 360bbb-3(b)(1), unless the authorization is terminated or revoked sooner.    Influenza A by PCR NEGATIVE NEGATIVE Final   Influenza B by PCR NEGATIVE NEGATIVE Final    Comment: (NOTE) The Xpert Xpress SARS-CoV-2/FLU/RSV assay is intended as an aid in  the diagnosis of influenza from Nasopharyngeal swab specimens and  should not be used as a sole basis for treatment. Nasal washings and  aspirates are unacceptable for Xpert Xpress SARS-CoV-2/FLU/RSV  testing. Fact Sheet for Patients: PinkCheek.be Fact Sheet for Healthcare Providers: GravelBags.it This test is not yet approved or cleared by the [place] FDA and  has been authorized for detection and/or diagnosis of SARS-CoV-2 by   FDA under an Emergency Use Authorization (EUA). This EUA will remain  in effect (meaning this test can be used) for the duration of the  Covid-19 declaration under Section 564(b)(1) of the Act, 21  U.S.C. section 360bbb-3(b)(1), unless the authorization is  terminated or revoked. Performed at [facility], [address] [place], [place] [address]   MRSA PCR Screening     Status: None   Collection Time: 11/11/19 11:31 AM   Specimen: Nasopharyngeal  Result Value Ref Range Status   MRSA by PCR  NEGATIVE NEGATIVE Final    Comment:        The GeneXpert MRSA Assay (FDA approved for NASAL specimens only), is one component of a comprehensive MRSA colonization surveillance program. It is not intended to diagnose MRSA infection nor to guide or monitor treatment for MRSA infections. Performed at [facility], [address] [place], [place] [address]     Coagulation Studies: Recent Labs    11/14/19 0502  LABPROT 17.4*  INR 1.4*    Urinalysis: No results for input(s): COLORURINE, LABSPEC, PHURINE, GLUCOSEU, HGBUR, BILIRUBINUR, KETONESUR, PROTEINUR, UROBILINOGEN, NITRITE, LEUKOCYTESUR in the last 72 hours.  Invalid input(s): APPERANCEUR    Imaging: PERIPHERAL VASCULAR CATHETERIZATION  Result Date: 11/14/2019 See op note  DG Chest Port 1 View  Result Date: 11/15/2019 CLINICAL DATA:  Short of breath. EXAM: PORTABLE CHEST 1 VIEW COMPARISON:  11/11/2019 FINDINGS: Right chest wall dialysis catheter is identified with tip at the cavoatrial junction. Heart size normal. Trace pleural effusions and mild interstitial edema identified. No airspace consolidation. IMPRESSION: Suspect mild CHF. Electronically Signed   By: Kerby Moors M.D.   On: 11/15/2019 10:53     Medications:    . atorvastatin  20 mg Oral Daily  . citalopram  20 mg Oral Daily  . epoetin (EPOGEN/PROCRIT) injection  10,000 Units Intravenous Q T,Th,Sa-HD  . fluticasone  2 spray Each Nare Daily   . gabapentin  200 mg Oral QHS  . influenza vac split quadrivalent PF  0.5 mL Intramuscular Tomorrow-1000  . lipase/protease/amylase  36,000 Units Oral TID WC  . pantoprazole  40 mg Oral Daily  . pneumococcal 23 valent vaccine  0.5 mL Intramuscular Tomorrow-1000  . sevelamer carbonate  2,400 mg Oral TID WC  . sodium chloride flush  10-40 mL Intracatheter Q12H   ipratropium-albuterol, ondansetron (ZOFRAN) IV, oxyCODONE, phenol, sodium chloride flush  Assessment/ Plan:  [name] is a 59 y.o. black female with end stage renal disease on hemodialysis for last fifteen years, pancreatic insufficiency, hypertension, peripheral vascular disease, GERD, diabetes mellitus type II,  with past medical history of ESRD on HD MWF, anemia of chronic kidney disease, COPD, hyperlipidemia who is admitted to [facility] on 11/11/2019 for Hemorrhagic shock (Angie) [R57.8] ESRD on hemodialysis (Enders) [O11.5, B26.2] Complication of arteriovenous dialysis fistula, initial encounter [T82.9XXA]  [facility] MWF [facility] 86.5kg  1.  ESRD on HD MWF: seen and examined on hemodialysis treatment. Tolerating treatment well.  With complication of dialysis device. Rupture of AVG (Hero). Status post ligation by Dr. Delana Meyer on 2/20. Suspected underlying graft infection, patient reports purulent drainage prior to admission. No cultures were sent - transition to TTS schedule. Patient wants to move dialysis clinics.   2.  Hypertension. 127/44 - amlodipine and  Carvedilol.   3.  Anemia of chronic kidney disease with acute blood loss. Hemoglobin 7.1 Status post 2 units PRBC and 1 unit FFP on 2/19. 1 unit PRBC on 2/21. Status post transfusions as above - EPO with HD treatment  4.  Secondary hyperparathyroidism:  - sevelamer    LOS: 4 [name] 2/23/202112:21 PM

## 2019-11-15 NOTE — Care Management Important Message (Signed)
Important Message  Patient Details  Name: [name] MRN: [id number] Date of Birth: 04/07/1961   Medicare Important Message Given:  Yes     Dannette Barbara 11/15/2019, 8:27 AM

## 2019-11-15 NOTE — Progress Notes (Signed)
Post HD Tx Note Pt tolerated well the Tx. Pt reports no chest pain or SOB. Pt continues to receive 2LO2 per [place] SPO2 100%. Pt BP continues to be stable throughout dialysis. Pt received 10K EPO as per Nephrologist order    11/15/19 1350  Hand-Off documentation  Report given to (Full Name) [place] RN   Report received from (Full Name) Newt Minion   Vital Signs  Temp 98.2 F (36.8 C)  Temp Source Oral  Pulse Rate (!) 56  Pulse Rate Source Monitor  Resp (!) 22  BP (!) 118/41  BP Location Right Arm  BP Method Automatic  Patient Position (if appropriate) Lying  Oxygen Therapy  SpO2 100 %  O2 Device Nasal Cannula  O2 Flow Rate (L/min) 2 L/min  Post-Hemodialysis Assessment  Rinseback Volume (mL) 250 mL  KECN 68.7 V  Dialyzer Clearance Lightly streaked  Duration of HD Treatment -hour(s) 3 hour(s)  Hemodialysis Intake (mL) 500 mL  UF Total -Machine (mL) 3000 mL  Net UF (mL) 2500 mL  Tolerated HD Treatment Yes  Hemodialysis Catheter Right Subclavian  Placement Date/Time: 11/14/19 1648   Time Out: Correct patient;Correct site;Correct procedure  Maximum sterile barrier precautions: Hand hygiene;Cap;Mask;Sterile gown;Sterile gloves;Large sterile sheet  Site Prep: Chlorhexidine (preferred)  Local Anes...  Site Condition No complications  Blue Lumen Status Heparin locked  Red Lumen Status Heparin locked  Purple Lumen Status N/A  Catheter fill solution Heparin 1000 units/ml  Catheter fill volume (Arterial) 1.6 cc  Catheter fill volume (Venous) 1.6  Dressing Type Biopatch  Dressing Status Clean;Dry;Intact  Drainage Description None  Dressing Change Due 11/18/19  Post treatment catheter status Capped and Clamped

## 2019-11-15 NOTE — Progress Notes (Signed)
Post HD Tx Assessment   11/15/19 1335  Neurological  Level of Consciousness Alert  Orientation Level Oriented to person;Oriented to place  Respiratory  Respiratory Pattern Regular  Chest Assessment Chest expansion symmetrical  Bilateral Breath Sounds Clear;Diminished;Fine crackles  R Upper  Breath Sounds Clear;Diminished  L Upper Breath Sounds Clear;Diminished  R Lower Breath Sounds Fine crackles  L Lower Breath Sounds Fine crackles  Cardiac  Pulse Regular  Heart Sounds S1, S2  Jugular Venous Distention (JVD) No  ECG Monitor Yes  Vascular  R Radial Pulse +2  L Radial Pulse +2  R Dorsalis Pedis Pulse +1  L Dorsalis Pedis Pulse +1  Edema Right upper extremity;Left upper extremity;Right lower extremity;Left lower extremity  RUE Edema +2  LUE Edema +2  RLE Edema +2  LLE Edema +2  Integumentary  Integumentary (WDL) X  Skin Color Appropriate for ethnicity  Skin Condition Dry  Skin Integrity Cracking;Fissure  Cracking Location Leg  Cracking Location Orientation Bilateral  Musculoskeletal  Musculoskeletal (WDL) X  Generalized Weakness Yes  Gastrointestinal  Bowel Sounds Assessment Hypoactive  Last BM Date 11/11/19  GU Assessment  Genitourinary (WDL) X  Genitourinary Symptoms Oliguria (ESRD Pt)  Psychosocial  Psychosocial (WDL) WDL

## 2019-11-15 NOTE — Progress Notes (Signed)
HD Tx Completed w/out complications   06/20/56 1345  Vital Signs  Pulse Rate (!) 56  Resp (!) 22  BP (!) 118/41  Oxygen Therapy  SpO2 100 %  O2 Device Nasal Cannula  O2 Flow Rate (L/min) 2 L/min  During Hemodialysis Assessment  Blood Flow Rate (mL/min) 400 mL/min  Arterial Pressure (mmHg) -150 mmHg  Venous Pressure (mmHg) 110 mmHg  Transmembrane Pressure (mmHg) 60 mmHg  Ultrafiltration Rate (mL/min) 1000 mL/min  Dialysate Flow Rate (mL/min) 600 ml/min  Conductivity: Machine  13.9  HD Safety Checks Performed Yes  Intra-Hemodialysis Comments Tx completed;Tolerated well

## 2019-11-15 NOTE — Progress Notes (Addendum)
PROGRESS NOTE                                                                                                                                                                                                             Patient Demographics:    [name], is a 59 y.o. female, DOB - 09-17-1961, WUJ:[id number]  Admit date - 11/11/2019   Admitting Physician Jennye Boroughs, MD  Outpatient Primary MD for the patient is Freddy Finner, NP  LOS - 4    Chief Complaint  Patient presents with  . Vascular Access Problem       Brief Narrative   59 year old female with end-stage renal disease on hemodialysis (M, W, F), peripheral vascular disease, hypertension, type 2 diabetes mellitus with peripheral neuropathy and obesity presented with uncontrollable bleeding from left upper extremity AV fistula.  When EMS arrived they noticed copious amount of blood.  She was found to be minimally responsive and in hemorrhagic shock.  Her hemoglobin was 5.5.  She received 2 unit PRBC and taken to the OR by vascular surgeon and underwent excision of the infected left arm brachial artery AV graft and placement of wound VAC.  She was also found to be bradycardic in the 40s during the procedure and she was intubated and transferred to ICU. 2D echo done showed EF of 55-60% with grade 2 diastolic dysfunction.  Cardiology consult was called and suggested this was reflex bradycardia in the setting of hemorrhagic shock and no indication for a pacemaker. Heart rate has remained stable and patient transferred to hospital service on 2/21.   Subjective:   Patient complains of feeling tired and having some difficulty breathing.  Denies any chest pain or palpitations.  No nausea or vomiting.  CBG stable.  Assessment  & Plan :   Principal problem Acute blood loss anemia with hemorrhagic shock Secondary to bleeding from the AV graft.  H&H stable after receiving total  4 unit PRBC.  Hemoglobin improved but slight drop this morning at 7.1.  Monitor closely and transfuse as needed. Noted for low diastolic blood pressure this morning which subsequently improved without intervention.  Monitor closely.  Active problems Ruptured and infected left AV graft with bleeding. S/p excision of left arm AV graft on 2/20 and wound VAC applied.  Nontunneled HD catheter in the right femoral vein initially  placed followed by an EJ PermCath placed (due to stents in the left subclavian and innominate vein and right IJ being small and occluded). Vascular recommends she may need a femoral access in the future Wound care following. I will place her on empiric IV Ancef for infected AV graft.  Follow surgical pathology  ESRD on hemodialysis (M, W, F) Received HD from right IJ permacath on 2/22.  Shortness of breath Sats stable on 1 L via nasal cannula.  Has mild bibasilar crackles.  Check chest x-ray.   Hypotension Low diastolic blood pressure this morning.  I will hold both her amlodipine and Coreg.  Chronic pancreatitis Continue Creon  Peripheral neuropathy Continue gabapentin  Thrombocytopenia Mild.  Monitor for now.  Code Status : Full code  Family Communication  : None  Disposition Plan  : Possibly home pending hospital course in the next 48 hours, monitor for further bleeding from AV fistula site, need for further PRBC transfusion, inpatient wound dressing and inpatient femoral access for dialysis.  Barriers For Discharge : As outlined above.  Consults  : Vascular surgery, renal,  Procedures  : Excision of left arm AV fistula and wound VAC application, right IJ permacath, 2D echo  DVT Prophylaxis  : SCDs  Lab Results  Component Value Date   PLT 108 (L) 11/15/2019    Antibiotics  :   Anti-infectives (From admission, onward)   Start     Dose/Rate Route Frequency Ordered Stop   11/14/19 0000  ceFAZolin (ANCEF) IVPB 1 g/50 mL premix    Note to Pharmacy:  Send with pt to OR   1 g 100 mL/hr over 30 Minutes Intravenous On call 11/13/19 1503 11/14/19 1658   11/12/19 0000  ceFAZolin (ANCEF) IVPB 1 g/50 mL premix    Note to Pharmacy: Send with pt to OR   1 g 100 mL/hr over 30 Minutes Intravenous On call 11/11/19 0751 11/13/19 0000        Objective:   Vitals:   11/14/19 2054 11/15/19 0407 11/15/19 0811 11/15/19 0824  BP: (!) 139/54 (!) 141/46 (!) 135/31 (!) 145/58  Pulse: (!) 56 (!) 55 (!) 55 (!) 55  Resp: 18 18 16    Temp: 98 F (36.7 C) (!) 97.4 F (36.3 C)    TempSrc: Oral Oral    SpO2: 100% 100% 100% 100%  Weight:      Height:        Wt Readings from Last 3 Encounters:  11/14/19 88.8 kg     Intake/Output Summary (Last 24 hours) at 11/15/2019 0957 Last data filed at 11/14/2019 2100 Gross per 24 hour  Intake 10 ml  Output 1500 ml  Net -1490 ml     Physical Exam  Gen: Fatigued and sleepy, not in acute distress HEENT: Pallor present moist mucosa, supple neck Chest: Right-sided permacath, fine bibasilar crackles CVS: N S1&S2, no murmurs,  GI: soft, NT, ND, BS+ Musculoskeletal: warm, no edema, dressing over left arm with wound VAC in place     Data Review:    CBC Recent Labs  Lab 11/13/19 2017 11/13/19 2017 11/14/19 0308 11/14/19 0502 11/14/19 0827 11/14/19 1439 11/15/19 0518  WBC 5.9   < > 5.8 5.9 5.7 5.8 5.9  HGB 7.5*   < > 7.4* 7.5* 7.3* 7.5* 7.1*  HCT 23.4*   < > 22.9* 23.2* 23.2* 23.7* 22.8*  PLT 102*   < > 102* 105* 103* 103* 108*  MCV 84.2   < > 83.6 83.8 84.7 84.9  86.7  MCH 27.0   < > 27.0 27.1 26.6 26.9 27.0  MCHC 32.1   < > 32.3 32.3 31.5 31.6 31.1  RDW 18.6*   < > 18.9* 18.9* 19.1* 18.6* 18.2*  LYMPHSABS 1.3  --  1.3  --  1.2 1.1 1.2  MONOABS 0.6  --  0.6  --  0.5 0.5 0.4  EOSABS 0.7*  --  0.6*  --  0.7* 0.6* 0.7*  BASOSABS 0.0  --  0.0  --  0.0 0.0 0.0   < > = values in this interval not displayed.    Chemistries  Recent Labs  Lab 11/11/19 0612 11/11/19 1325 11/12/19 0600  11/14/19 0900  NA 132* 132* 135 135  K 4.2 4.1 4.3 4.4  CL 97* 98 101 94*  CO2 23 24 24  32  GLUCOSE 151* 200* 58* 105*  BUN 27* 27* 31* 18  CREATININE 7.24* 6.89* 7.71* 5.73*  CALCIUM 7.0* 6.8* 7.2* 7.7*  MG  --  2.0 2.0  --   AST 13* 15  --   --   ALT 9 9  --   --   ALKPHOS 49 41  --   --   BILITOT 0.8 1.1  --   --    ------------------------------------------------------------------------------------------------------------------ No results for input(s): CHOL, HDL, LDLCALC, TRIG, CHOLHDL, LDLDIRECT in the last 72 hours.  No results found for: HGBA1C ------------------------------------------------------------------------------------------------------------------ No results for input(s): TSH, T4TOTAL, T3FREE, THYROIDAB in the last 72 hours.  Invalid input(s): FREET3 ------------------------------------------------------------------------------------------------------------------ No results for input(s): VITAMINB12, FOLATE, FERRITIN, TIBC, IRON, RETICCTPCT in the last 72 hours.  Coagulation profile Recent Labs  Lab 11/11/19 0612 11/11/19 1132 11/12/19 0600 11/14/19 0502  INR 1.4* 1.5* 1.4* 1.4*    No results for input(s): DDIMER in the last 72 hours.  Cardiac Enzymes No results for input(s): CKMB, TROPONINI, MYOGLOBIN in the last 168 hours.  Invalid input(s): CK ------------------------------------------------------------------------------------------------------------------ No results found for: BNP  Inpatient Medications  Scheduled Meds: . amLODipine  10 mg Oral Daily  . atorvastatin  20 mg Oral Daily  . carvedilol  6.25 mg Oral BID  . Chlorhexidine Gluconate Cloth  6 each Topical Daily  . citalopram  20 mg Oral Daily  . epoetin (EPOGEN/PROCRIT) injection  10,000 Units Intravenous Q T,Th,Sa-HD  . fluticasone  2 spray Each Nare Daily  . gabapentin  200 mg Oral QHS  . influenza vac split quadrivalent PF  0.5 mL Intramuscular Tomorrow-1000  .  lipase/protease/amylase  36,000 Units Oral TID WC  . pantoprazole  40 mg Oral Daily  . pneumococcal 23 valent vaccine  0.5 mL Intramuscular Tomorrow-1000  . sevelamer carbonate  2,400 mg Oral TID WC  . sodium chloride flush  10-40 mL Intracatheter Q12H   Continuous Infusions: PRN Meds:.fentaNYL (SUBLIMAZE) injection, ipratropium-albuterol, ondansetron (ZOFRAN) IV, oxyCODONE, phenol, sodium chloride flush  Micro Results Recent Results (from the past 240 hour(s))  Respiratory Panel by RT PCR (Flu A&B, Covid) - Nasopharyngeal Swab     Status: None   Collection Time: 11/11/19  7:00 AM   Specimen: Nasopharyngeal Swab  Result Value Ref Range Status   SARS Coronavirus 2 by RT PCR NEGATIVE NEGATIVE Final    Comment: (NOTE) SARS-CoV-2 target nucleic acids are NOT DETECTED. The SARS-CoV-2 RNA is generally detectable in upper respiratoy specimens during the acute phase of infection. The lowest concentration of SARS-CoV-2 viral copies this assay can detect is 131 copies/mL. A negative result does not preclude SARS-Cov-2 infection and should not be used as  the sole basis for treatment or other patient management decisions. A negative result may occur with  improper specimen collection/handling, submission of specimen other than nasopharyngeal swab, presence of viral mutation(s) within the areas targeted by this assay, and inadequate number of viral copies (<131 copies/mL). A negative result must be combined with clinical observations, patient history, and epidemiological information. The expected result is Negative. Fact Sheet for Patients:  PinkCheek.be Fact Sheet for Healthcare Providers:  GravelBags.it This test is not yet ap proved or cleared by the [place] FDA and  has been authorized for detection and/or diagnosis of SARS-CoV-2 by FDA under an Emergency Use Authorization (EUA). This EUA will remain  in effect (meaning this  test can be used) for the duration of the COVID-19 declaration under Section 564(b)(1) of the Act, 21 U.S.C. section 360bbb-3(b)(1), unless the authorization is terminated or revoked sooner.    Influenza A by PCR NEGATIVE NEGATIVE Final   Influenza B by PCR NEGATIVE NEGATIVE Final    Comment: (NOTE) The Xpert Xpress SARS-CoV-2/FLU/RSV assay is intended as an aid in  the diagnosis of influenza from Nasopharyngeal swab specimens and  should not be used as a sole basis for treatment. Nasal washings and  aspirates are unacceptable for Xpert Xpress SARS-CoV-2/FLU/RSV  testing. Fact Sheet for Patients: PinkCheek.be Fact Sheet for Healthcare Providers: GravelBags.it This test is not yet approved or cleared by the [place] FDA and  has been authorized for detection and/or diagnosis of SARS-CoV-2 by  FDA under an Emergency Use Authorization (EUA). This EUA will remain  in effect (meaning this test can be used) for the duration of the  Covid-19 declaration under Section 564(b)(1) of the Act, 21  U.S.C. section 360bbb-3(b)(1), unless the authorization is  terminated or revoked. Performed at [facility], [address] [place], [place] [address]   MRSA PCR Screening     Status: None   Collection Time: 11/11/19 11:31 AM   Specimen: Nasopharyngeal  Result Value Ref Range Status   MRSA by PCR NEGATIVE NEGATIVE Final    Comment:        The GeneXpert MRSA Assay (FDA approved for NASAL specimens only), is one component of a comprehensive MRSA colonization surveillance program. It is not intended to diagnose MRSA infection nor to guide or monitor treatment for MRSA infections. Performed at [facility], [address] [place], [place] [address]     Radiology Reports PERIPHERAL VASCULAR CATHETERIZATION  Result Date: 11/14/2019 See op note  DG Chest Port 1 View  Result Date: 11/11/2019 CLINICAL DATA:   Respiratory failure. EXAM: PORTABLE CHEST 1 VIEW COMPARISON:  09/30/2019. FINDINGS: Endotracheal tube noted with tip 5 cm above the carina. Heart size normal. Bilateral pulmonary infiltrates/edema. Small bilateral pleural effusion. No pneumothorax. Left subclavian stents noted. Surgical staples noted over the left chest. IMPRESSION: 1.  Endotracheal tube noted with tip 5 cm above the carina. 2.  Diffuse bilateral pulmonary infiltrates/edema. Electronically Signed   By: Marcello Moores  Register   On: 11/11/2019 12:00   ECHOCARDIOGRAM COMPLETE  Result Date: 11/12/2019    ECHOCARDIOGRAM REPORT   Patient Name:   [name] Date of Exam: 11/11/2019 Medical Rec #:  [id number]   Height:       63.0 in Accession #:    [id number]  Weight:       194.2 lb Date of Birth:  1961-02-04    BSA:          1.91 m Patient Age:  58 years    BP:           105/50 mmHg Patient Gender: F           HR:           49 bpm. Exam Location:  [facility] Procedure: 2D Echo, Cardiac Doppler and Color Doppler Indications:     Acute Resp. Insufficiency  History:         Patient has no prior history of Echocardiogram examinations.  Sonographer:     Alyse Low Roar Referring Phys:  [id number] CARMEN Veda Canning Diagnosing Phys: Mertie Moores MD IMPRESSIONS  1. Left ventricular ejection fraction, by estimation, is 55 to 60%. The left ventricle has normal function. The left ventricle has no regional wall motion abnormalities. Left ventricular diastolic parameters are consistent with Grade II diastolic dysfunction (pseudonormalization).  2. Right ventricular systolic function is normal. The right ventricular size is normal. There is moderately elevated pulmonary artery systolic pressure.  3. Left atrial size was mildly dilated.  4. The mitral valve is normal in structure and function. No evidence of mitral valve regurgitation. No evidence of mitral stenosis.  5. The aortic valve is normal in structure and function. Aortic valve regurgitation is not visualized. No aortic  stenosis is present. FINDINGS  Left Ventricle: Left ventricular ejection fraction, by estimation, is 55 to 60%. The left ventricle has normal function. The left ventricle has no regional wall motion abnormalities. The left ventricular internal cavity size was small. There is no left ventricular hypertrophy. Left ventricular diastolic parameters are consistent with Grade II diastolic dysfunction (pseudonormalization). Right Ventricle: The right ventricular size is normal. No increase in right ventricular wall thickness. Right ventricular systolic function is normal. There is moderately elevated pulmonary artery systolic pressure. The tricuspid regurgitant velocity is 3.14 m/s, and with an assumed right atrial pressure of 10 mmHg, the estimated right ventricular systolic pressure is 10.2 mmHg. Left Atrium: Left atrial size was mildly dilated. Right Atrium: Right atrial size was normal in size. Pericardium: There is no evidence of pericardial effusion. Mitral Valve: The mitral valve is normal in structure and function. No evidence of mitral valve regurgitation. No evidence of mitral valve stenosis. Tricuspid Valve: The tricuspid valve is normal in structure. Tricuspid valve regurgitation is mild. Aortic Valve: The aortic valve is normal in structure and function. Aortic valve regurgitation is not visualized. No aortic stenosis is present. Aortic valve mean gradient measures 4.0 mmHg. Aortic valve peak gradient measures 9.4 mmHg. Aortic valve area, by VTI measures 1.94 cm. Pulmonic Valve: The pulmonic valve was normal in structure. Pulmonic valve regurgitation is mild. Aorta: The aortic root and ascending aorta are structurally normal, with no evidence of dilitation. IAS/Shunts: The atrial septum is grossly normal.  LEFT VENTRICLE PLAX 2D LVIDd:         4.45 cm  Diastology LVIDs:         3.34 cm  LV e' lateral:   7.29 cm/s LV PW:         1.04 cm  LV E/e' lateral: 14.7 LV IVS:        1.23 cm  LV e' medial:    5.77 cm/s  LVOT diam:     1.80 cm  LV E/e' medial:  18.5 LV SV:         64.64 ml LV SV Index:   22.12 LVOT Area:     2.54 cm  RIGHT VENTRICLE RV Mid diam:    2.99 cm RV S prime:  11.20 cm/s LEFT ATRIUM           Index LA diam:      4.40 cm 2.30 cm/m LA Vol (A4C): 58.5 ml 30.63 ml/m  AORTIC VALVE                   PULMONIC VALVE AV Area (Vmax):    1.81 cm    PV Vmax:        0.91 m/s AV Area (Vmean):   2.14 cm    PV Peak grad:   3.3 mmHg AV Area (VTI):     1.94 cm    RVOT Peak grad: 2 mmHg AV Vmax:           153.00 cm/s AV Vmean:          93.700 cm/s AV VTI:            0.334 m AV Peak Grad:      9.4 mmHg AV Mean Grad:      4.0 mmHg LVOT Vmax:         109.00 cm/s LVOT Vmean:        78.900 cm/s LVOT VTI:          0.254 m LVOT/AV VTI ratio: 0.76  AORTA Ao Root diam: 2.60 cm MITRAL VALVE                TRICUSPID VALVE MV Area (PHT): 2.55 cm     TR Peak grad:   39.4 mmHg MV Decel Time: 298 msec     TR Vmax:        314.00 cm/s MV E velocity: 107.00 cm/s MV A velocity: 110.00 cm/s  SHUNTS MV E/A ratio:  0.97         Systemic VTI:  0.25 m                             Systemic Diam: 1.80 cm Mertie Moores MD Electronically signed by Mertie Moores MD Signature Date/Time: 11/12/2019/2:05:31 PM    Final     Time Spent in minutes  25   Yordi Krager M.D on 11/15/2019 at 9:57 AM  Between 7am to 7pm - Pager - [phone]  After 7pm go to [email or web address] - password [facility]  [facility] -  Office  [phone]

## 2019-11-15 NOTE — TOC Initial Note (Signed)
Transition of Care [facility]) - Initial/Assessment Note    Patient Details  Name: [name] MRN: [id number] Date of Birth: 1960/09/24  Transition of Care [facility] CM/SW Contact:    Candie Chroman, LCSW Phone Number: 11/15/2019, 3:20 PM  Clinical Narrative: CSW met with patient. No supports at bedside. CSW introduced role and explained that discharge planning would be discussed. Patient will discharge home with a wound vac with MWF dressing changes. Patient agreeable to home health. First preference agency is [facility]. She has worked with them in the past and been very pleased with their services. Referral made and accepted by Sharmon Revere. They are hopeful for discharge Friday because they are unsure if they could get a nurse to her home before then. Patient has canes and walkers at home. She also has a 3-in-1 that she uses as an elevated toilet seat. Patient stated that her bed is steps away from her bathroom. No further concerns. CSW encouraged patient to contact CSW as needed. CSW will continue to follow patient for support and facilitate return home when stable.                 Expected Discharge Plan: [place] Barriers to Discharge: Continued Medical Work up   Patient Goals and CMS Choice        Expected Discharge Plan and Services Expected Discharge Plan: [place] Choice: Home Health, Durable Medical Equipment Living arrangements for the past 2 months: Apartment                           HH Arranged: RN, PT HH Agency: [facility] Date [place]: 11/15/19   Representative spoke with at [place]: Sharmon Revere  Prior Living Arrangements/Services Living arrangements for the past 2 months: Apartment Lives with:: Self Patient language and need for interpreter reviewed:: Yes Do you feel safe going back to the place where you live?: Yes      Need for Family Participation in Patient Care: Yes  (Comment) Care giver support system in place?: No (comment) Current home services: DME Criminal Activity/Legal Involvement Pertinent to Current Situation/Hospitalization: No - Comment as needed  Activities of Daily Living Home Assistive Devices/Equipment: None ADL Screening (condition at time of admission) Patient's cognitive ability adequate to safely complete daily activities?: Yes Is the patient deaf or have difficulty hearing?: No Does the patient have difficulty seeing, even when wearing glasses/contacts?: No Does the patient have difficulty concentrating, remembering, or making decisions?: No Patient able to express need for assistance with ADLs?: No Does the patient have difficulty dressing or bathing?: No Independently performs ADLs?: Yes (appropriate for developmental age) Does the patient have difficulty walking or climbing stairs?: No Weakness of Legs: Both Weakness of Arms/Hands: Both  Permission Sought/Granted Permission sought to share information with : Facility Art therapist granted to share information with : Yes, Verbal Permission Granted     Permission granted to share info w AGENCY: [facility]        Emotional Assessment Appearance:: Appears stated age Attitude/Demeanor/Rapport: Engaged, Gracious Affect (typically observed): Accepting, Appropriate, Calm, Pleasant Orientation: : Oriented to Self, Oriented to Place, Oriented to  Time, Oriented to Situation Alcohol / Substance Use: Not Applicable Psych Involvement: No (comment)  Admission diagnosis:  Hemorrhagic shock ([place]) [R57.8] ESRD on hemodialysis ([place]) [W88.8, B16.9] Complication of arteriovenous dialysis fistula, initial encounter [T82.9XXA]  Patient Active Problem List   Diagnosis Date Noted  . Complication of vascular access for dialysis 11/13/2019  . PVD (peripheral vascular disease) ([place]) 11/13/2019  . Severe anemia 11/13/2019  . Pressure injury of skin 11/13/2019  .  ESRD on hemodialysis ([place])   . Hemorrhagic shock ([place]) 11/11/2019   PCP:  Freddy Finner, NP Pharmacy:   [id number] [id number]: [phone]: [phone]    Social Determinants of Health (SDOH) Interventions    Readmission Risk Interventions No flowsheet data found.

## 2019-11-16 DIAGNOSIS — R0602 Shortness of breath: Secondary | ICD-10-CM

## 2019-11-16 DIAGNOSIS — D649 Anemia, unspecified: Secondary | ICD-10-CM

## 2019-11-16 DIAGNOSIS — T829XXD Unspecified complication of cardiac and vascular prosthetic device, implant and graft, subsequent encounter: Secondary | ICD-10-CM

## 2019-11-16 LAB — BASIC METABOLIC PANEL
Anion gap: 5 (ref 5–15)
BUN: 6 mg/dL (ref 6–20)
CO2: 34 mmol/L — ABNORMAL HIGH (ref 22–32)
Calcium: 7.9 mg/dL — ABNORMAL LOW (ref 8.9–10.3)
Chloride: 99 mmol/L (ref 98–111)
Creatinine, Ser: 2.89 mg/dL — ABNORMAL HIGH (ref 0.44–1.00)
GFR calc Af Amer: 20 mL/min — ABNORMAL LOW (ref >60)
GFR calc non Af Amer: 17 mL/min — ABNORMAL LOW (ref >60)
Glucose, Bld: 83 mg/dL (ref 70–99)
Potassium: 3.5 mmol/L (ref 3.5–5.1)
Sodium: 138 mmol/L (ref 135–145)

## 2019-11-16 LAB — IRON AND TIBC
Iron: 34 ug/dL (ref 28–170)
Saturation Ratios: 34 % — ABNORMAL HIGH (ref 10.4–31.8)
TIBC: 99 ug/dL — ABNORMAL LOW (ref 250–450)
UIBC: 65 ug/dL

## 2019-11-16 LAB — CBC
HCT: 22.3 % — ABNORMAL LOW (ref 36.0–46.0)
Hemoglobin: 6.9 g/dL — ABNORMAL LOW (ref 12.0–15.0)
MCH: 27.1 pg (ref 26.0–34.0)
MCHC: 30.9 g/dL (ref 30.0–36.0)
MCV: 87.5 fL (ref 80.0–100.0)
Platelets: 132 10*3/uL — ABNORMAL LOW (ref 150–400)
RBC: 2.55 MIL/uL — ABNORMAL LOW (ref 3.87–5.11)
RDW: 17.9 % — ABNORMAL HIGH (ref 11.5–15.5)
WBC: 5.3 10*3/uL (ref 4.0–10.5)
nRBC: 0 % (ref 0.0–0.2)

## 2019-11-16 LAB — GLUCOSE, CAPILLARY
Glucose-Capillary: 88 mg/dL (ref 70–99)
Glucose-Capillary: 81 mg/dL (ref 70–99)
Glucose-Capillary: 112 mg/dL — ABNORMAL HIGH (ref 70–99)
Glucose-Capillary: 75 mg/dL (ref 70–99)

## 2019-11-16 LAB — HEMOGLOBIN AND HEMATOCRIT, BLOOD
HCT: 28 % — ABNORMAL LOW (ref 36.0–46.0)
Hemoglobin: 8.8 g/dL — ABNORMAL LOW (ref 12.0–15.0)

## 2019-11-16 LAB — PREPARE RBC (CROSSMATCH)

## 2019-11-16 MED ORDER — EPOETIN ALFA 10000 UNIT/ML IJ SOLN
10000.0000 [IU] | INTRAMUSCULAR | Status: DC
  Administered 2019-11-16 – 2019-11-18 (×2): 10000 [IU] via INTRAVENOUS

## 2019-11-16 MED ORDER — SODIUM CHLORIDE 0.9% IV SOLUTION: Freq: Once | INTRAVENOUS | Status: DC

## 2019-11-16 MED ORDER — PROMETHAZINE HCL 25 MG/ML IJ SOLN
12.5000 mg | Freq: Four times a day (QID) | INTRAMUSCULAR | Status: DC | PRN
  Administered 2019-11-17 – 2019-11-18 (×3): 12.5 mg via INTRAVENOUS
  Filled 2019-11-16 (×3): qty 1

## 2019-11-16 MED ORDER — PROMETHAZINE HCL 25 MG PO TABS
12.5000 mg | ORAL_TABLET | Freq: Three times a day (TID) | ORAL | Status: DC | PRN
  Administered 2019-11-16 – 2019-11-18 (×2): 12.5 mg via ORAL
  Filled 2019-11-16 (×2): qty 1

## 2019-11-16 NOTE — Consult Note (Addendum)
[name]: Pt is preparing to go to dialysis at 11:00 and will be there most of the afternoon.  Vac dressing change was previously scheduled to be performed at 1:00 with the Vascular team present to assess the wound appearance of the left arm full thickness post-op wound.  Domenic Moras, [name], communicated with the Vascular team and they will reschedule the Vac dressing to tomorrow (Thurs) morning at 10:00.  [name] will perform the dressing change as requested at that time. Supplies at the bedside and informed patient of the plan of care.  Julien Girt MSN, RN, [place], [name], [place]

## 2019-11-16 NOTE — Progress Notes (Signed)
Pre HD Tx    11/16/19 0945  Vital Signs  Temp 98 F (36.7 C)  Temp Source Oral  Pulse Rate 81  Pulse Rate Source Monitor  Resp 16  BP 113/76  BP Location Right Arm  BP Method Automatic  Patient Position (if appropriate) Lying  Oxygen Therapy  SpO2 100 %  O2 Device Room Air  Pain Assessment  Pain Scale 0-10  Pain Score 0  Dialysis Weight  Weight 92.2 kg  Type of Weight Pre-Dialysis  Time-Out for Hemodialysis  What Procedure? HD   Pt Identifiers(min of two) First/Last Name;MRN/Account#  Correct Site? Yes  Correct Side? Yes  Correct Procedure? Yes  Consents Verified? Yes  Rad Studies Available? N/A  Safety Precautions Reviewed? Yes  Engineer, civil (consulting) Number 6  Station Number 4  UF/Alarm Test Passed  Conductivity: Meter 14  Conductivity: Machine  14  pH 7.2  Reverse Osmosis Main  Normal Saline Lot Number [id number]  Dialyzer Lot Number [id number]  Disposable Set Lot Number [id number]  Machine Temperature 98.6 F (37 C)  Blood Lines Intact and Secured Yes  Pre Treatment Patient Checks  Vascular access used during treatment Catheter  HD catheter dressing before treatment WDL  Hepatitis B Surface Antigen Results Negative  Date Hepatitis B Surface Antigen Drawn 10/18/19  Hepatitis B Surface Antibody  (<10)  Date Hepatitis B Surface Antibody Drawn 10/18/19  Hemodialysis Standing Orders Initiated Yes  ECG (Telemetry) Monitor On Yes  Prime Ordered Normal Saline  Length of  DialysisTreatment -hour(s) 2.5 Hour(s)  Dialysis Treatment Comments Na 140  Dialyzer Elisio 17H NR  Dialysate 3K;2.5 Ca  Dialysis Anticoagulant None  Dialysate Flow Ordered 600  Blood Flow Rate Ordered 400 mL/min  Ultrafiltration Goal 0.5 Liters  Dialysis Blood Pressure Support Ordered Normal Saline  Education / Care Plan  Dialysis Education Provided Yes  Documented Education in Care Plan Yes  Hemodialysis Catheter Right Subclavian  Placement Date/Time: 11/14/19 1648   Time Out: Correct  patient;Correct site;Correct procedure  Maximum sterile barrier precautions: Hand hygiene;Cap;Mask;Sterile gown;Sterile gloves;Large sterile sheet  Site Prep: Chlorhexidine (preferred)  Local Anes...  Site Condition No complications  Blue Lumen Status Blood return noted  Red Lumen Status Blood return noted  Purple Lumen Status N/A     11/16/19 0945  Vital Signs  Temp 98 F (36.7 C)  Temp Source Oral  Pulse Rate 81  Pulse Rate Source Monitor  Resp 16  BP 113/76  BP Location Right Arm  BP Method Automatic  Patient Position (if appropriate) Lying  Oxygen Therapy  SpO2 100 %  O2 Device Room Air  Pain Assessment  Pain Scale 0-10  Pain Score 0  Dialysis Weight  Weight 92.2 kg  Type of Weight Pre-Dialysis  Time-Out for Hemodialysis  What Procedure? HD   Pt Identifiers(min of two) First/Last Name;MRN/Account#  Correct Site? Yes  Correct Side? Yes  Correct Procedure? Yes  Consents Verified? Yes  Rad Studies Available? N/A  Safety Precautions Reviewed? Yes  Engineer, civil (consulting) Number 6  Station Number 4  UF/Alarm Test Passed  Conductivity: Meter 14  Conductivity: Machine  14  pH 7.2  Reverse Osmosis Main  Normal Saline Lot Number [id number]  Dialyzer Lot Number [id number]  Disposable Set Lot Number [id number]  Machine Temperature 98.6 F (37 C)  Blood Lines Intact and Secured Yes  Pre Treatment Patient Checks  Vascular access used during treatment Catheter  HD catheter dressing before treatment WDL  Hepatitis B Surface Antigen Results  Negative  Date Hepatitis B Surface Antigen Drawn 10/18/19  Hepatitis B Surface Antibody  (<10)  Date Hepatitis B Surface Antibody Drawn 10/18/19  Hemodialysis Standing Orders Initiated Yes  ECG (Telemetry) Monitor On Yes  Prime Ordered Normal Saline  Length of  DialysisTreatment -hour(s) 2.5 Hour(s)  Dialysis Treatment Comments Na 140  Dialyzer Elisio 17H NR  Dialysate 3K;2.5 Ca  Dialysis Anticoagulant None  Dialysate Flow Ordered 600   Blood Flow Rate Ordered 400 mL/min  Ultrafiltration Goal 0.5 Liters  Dialysis Blood Pressure Support Ordered Normal Saline  Education / Care Plan  Dialysis Education Provided Yes  Documented Education in Care Plan Yes  Hemodialysis Catheter Right Subclavian  Placement Date/Time: 11/14/19 1648   Time Out: Correct patient;Correct site;Correct procedure  Maximum sterile barrier precautions: Hand hygiene;Cap;Mask;Sterile gown;Sterile gloves;Large sterile sheet  Site Prep: Chlorhexidine (preferred)  Local Anes...  Site Condition No complications  Blue Lumen Status Blood return noted  Red Lumen Status Blood return noted  Purple Lumen Status N/A

## 2019-11-16 NOTE — TOC Progression Note (Signed)
Transition of Care [facility]) - Progression Note    Patient Details  Name: [name] MRN: [id number] Date of Birth: 1961-07-16  Transition of [facility] CM/SW Contact  Candie Chroman, LCSW Phone Number: 11/16/2019, 2:20 PM  Clinical Narrative: Notified patient that [facility] can accept her for home health nursing. Notified [facility] and [employer] representative that patient will likely discharge home tomorrow.   Expected Discharge Plan: [place] Barriers to Discharge: Continued Medical Work up  Expected Discharge Plan and Services Expected Discharge Plan: [place], Durable Medical Equipment Living arrangements for the past 2 months: Apartment                           HH Arranged: RN, PT [facility] Agency: [place] Date [place]: 11/15/19   Representative spoke with at [place]: [place] (SDOH) Interventions    Readmission Risk Interventions No flowsheet data found.

## 2019-11-16 NOTE — Progress Notes (Signed)
[facility] Daily Progress Note   Subjective: 1. Insertion of triple-lumen catheter left femoral vein with ultrasound guidance. 2.Excision of infected left arm brachial axillary AV graft  11/14/19: 1. Ultrasound guidance for vascular access to the rightexternaljugular vein 2. Right jugular venogram and superior venacavogram 3. Fluoroscopic guidance for placement of catheter 4. Placement of a19cm tip to cuff tunneled hemodialysis catheter via the right externaljugular vein  Patient with continued generalized weakness this AM.   Objective: Vitals:   11/16/19 1200 11/16/19 1215 11/16/19 1230 11/16/19 1245  BP: (!) 162/66 (!) 172/57 (!) 169/59 (!) 173/58  Pulse:      Resp: (!) 8 12 12 11   Temp:      TempSrc:      SpO2:      Weight:      Height:        Intake/Output Summary (Last 24 hours) at 11/16/2019 1248 Last data filed at 11/16/2019 1220 Gross per 24 hour  Intake 958 ml  Output 2500 ml  Net -1542 ml   Physical Exam: A&Ox3, NAD Chest:              Right External Jugular Permcath: Intact, clean and dry.  CV: RRR Pulmonary: CTA Bilaterally Abdomen: Soft, Nontender, Nondistended Vascular:             Left Upper Extremity: Arm soft, VAC intact and to suction. Proximal and distal incision - staples clean and dry.   Laboratory: CBC    Component Value Date/Time   WBC 5.3 11/16/2019 0417   HGB 6.9 (L) 11/16/2019 0417   HCT 22.3 (L) 11/16/2019 0417   PLT 132 (L) 11/16/2019 0417   BMET    Component Value Date/Time   NA 138 11/16/2019 0417   K 3.5 11/16/2019 0417   CL 99 11/16/2019 0417   CO2 34 (H) 11/16/2019 0417   GLUCOSE 83 11/16/2019 0417   BUN 6 11/16/2019 0417   CREATININE 2.89 (H) 11/16/2019 0417   CALCIUM 7.9 (L) 11/16/2019 0417   GFRNONAA 17 (L) 11/16/2019 0417   GFRAA 20 (L) 11/16/2019 0417   Assessment/Planning: The patient is a 59 year old female who presented to the [facility] emergency  department on November 11, 2019 with an infected and hemorrhaging left upper extremity dialysis access graft. This was removed - (11/11/19), right external jugular permcath placed - (11/14/19)  1) Left Upper Extremity Wound: VAC changes T-Th-S Will plan on meeting wound nurse at [place] work has arranged visit nurse services for wound care as outpatient  2) ESRD: Permcath placed in right external jugular vein If fails, will need femoral permcath  Discussed with Dr. Eber Hong Wilhelmina Hark PA-C 11/16/2019 12:48 PM

## 2019-11-16 NOTE — Progress Notes (Signed)
[facility]  ROUNDING NOTE   Subjective:   Extra dialysis treatment yesterday. Tolerated treatment well. UF of 2.5 liters.  Patient feels very weak and tired this morning.   Objective:  Vital signs in last 24 hours:  Temp:  [98 F (36.7 C)-98.6 F (37 C)] 98 F (36.7 C) (02/24 1105) Pulse Rate:  [50-88] 88 (02/24 1105) Resp:  [9-28] 11 (02/24 1105) BP: (104-156)/(30-82) 156/74 (02/24 1105) SpO2:  [100 %] 100 % (02/24 0938) Weight:  [92 kg] 92 kg (02/24 0420)  Weight change:  Filed Weights   11/14/19 0500 11/15/19 1045 11/16/19 0420  Weight: 88.8 kg 88.8 kg 92 kg    Intake/Output: I/O last 3 completed shifts: In: 238 [P.O.:120; I.V.:68; IV Piggyback:50] Out: 2500 [Other:2500]   Intake/Output this shift:  No intake/output data recorded.  Physical Exam: General: NAD, laying in bed  Head: [place]/AT, adentulous  Eyes: PERRL  Neck: Supple, trachea midline  Lungs:  Clear to auscultation     Heart: regular  Abdomen:  Soft, nontender, bowel sounds present  Extremities: left arm with clean dressings. + wound vac  Neurologic: Alert and oriented  Skin: No lesions  Access: Left femoral temp HD catheter 2/20 Dr. Nickola Major permcath 2/22 Dr. Lucky Cowboy    Basic Metabolic Panel: Recent Labs  Lab 11/11/19 1325 11/11/19 1325 11/12/19 0600 11/12/19 0600 11/14/19 0900 11/15/19 0957 11/16/19 0417  NA 132*  --  135  --  135 136 138  K 4.1  --  4.3  --  4.4 3.9 3.5  CL 98  --  101  --  94* 97* 99  CO2 24  --  24  --  32 32 34*  GLUCOSE 200*  --  58*  --  105* 115* 83  BUN 27*  --  31*  --  18 11 6   CREATININE 6.89*  --  7.71*  --  5.73* 3.98* 2.89*  CALCIUM 6.8*   < > 7.2*   < > 7.7* 7.9* 7.9*  MG 2.0  --  2.0  --   --   --   --   PHOS  --   --  5.2*  --  4.5 3.2  --    < > = values in this interval not displayed.    Liver Function Tests: Recent Labs  Lab 11/11/19 0612 11/11/19 1325 11/14/19 0900 11/15/19 0957  AST 13* 15  --   --   ALT 9 9  --   --    ALKPHOS 49 41  --   --   BILITOT 0.8 1.1  --   --   PROT 4.4* 4.7*  --   --   ALBUMIN 2.2* 2.8* 2.4* 2.5*   No results for input(s): LIPASE, AMYLASE in the last 168 hours. No results for input(s): AMMONIA in the last 168 hours.  CBC: Recent Labs  Lab 11/13/19 2017 11/13/19 2017 11/14/19 0308 11/14/19 0502 11/14/19 0827 11/14/19 1439 11/15/19 0518 11/15/19 0957 11/16/19 0417  WBC 5.9   < > 5.8   < > 5.7 5.8 5.9 5.2 5.3  NEUTROABS 3.3  --  3.2  --  3.3 3.6 3.6  --   --   HGB 7.5*   < > 7.4*   < > 7.3* 7.5* 7.1* 7.1* 6.9*  HCT 23.4*   < > 22.9*   < > 23.2* 23.7* 22.8* 23.1* 22.3*  MCV 84.2   < > 83.6   < > 84.7 84.9 86.7 87.2  87.5  PLT 102*   < > 102*   < > 103* 103* 108* 109* 132*   < > = values in this interval not displayed.    Cardiac Enzymes: No results for input(s): CKTOTAL, CKMB, CKMBINDEX, TROPONINI in the last 168 hours.  BNP: Invalid input(s): POCBNP  CBG: Recent Labs  Lab 11/15/19 0803 11/15/19 1456 11/15/19 1638 11/15/19 2125 11/16/19 0849  GLUCAP 115* 70 114* 149* 88    Microbiology: Results for orders placed or performed during the hospital encounter of 11/11/19  Respiratory Panel by RT PCR (Flu A&B, Covid) - Nasopharyngeal Swab     Status: None   Collection Time: 11/11/19  7:00 AM   Specimen: Nasopharyngeal Swab  Result Value Ref Range Status   SARS Coronavirus 2 by RT PCR NEGATIVE NEGATIVE Final    Comment: (NOTE) SARS-CoV-2 target nucleic acids are NOT DETECTED. The SARS-CoV-2 RNA is generally detectable in upper respiratoy specimens during the acute phase of infection. The lowest concentration of SARS-CoV-2 viral copies this assay can detect is 131 copies/mL. A negative result does not preclude SARS-Cov-2 infection and should not be used as the sole basis for treatment or other patient management decisions. A negative result may occur with  improper specimen collection/handling, submission of specimen other than nasopharyngeal swab,  presence of viral mutation(s) within the areas targeted by this assay, and inadequate number of viral copies (<131 copies/mL). A negative result must be combined with clinical observations, patient history, and epidemiological information. The expected result is Negative. Fact Sheet for Patients:  PinkCheek.be Fact Sheet for Healthcare Providers:  GravelBags.it This test is not yet ap proved or cleared by the [place] FDA and  has been authorized for detection and/or diagnosis of SARS-CoV-2 by FDA under an Emergency Use Authorization (EUA). This EUA will remain  in effect (meaning this test can be used) for the duration of the COVID-19 declaration under Section 564(b)(1) of the Act, 21 U.S.C. section 360bbb-3(b)(1), unless the authorization is terminated or revoked sooner.    Influenza A by PCR NEGATIVE NEGATIVE Final   Influenza B by PCR NEGATIVE NEGATIVE Final    Comment: (NOTE) The Xpert Xpress SARS-CoV-2/FLU/RSV assay is intended as an aid in  the diagnosis of influenza from Nasopharyngeal swab specimens and  should not be used as a sole basis for treatment. Nasal washings and  aspirates are unacceptable for Xpert Xpress SARS-CoV-2/FLU/RSV  testing. Fact Sheet for Patients: PinkCheek.be Fact Sheet for Healthcare Providers: GravelBags.it This test is not yet approved or cleared by the [place] FDA and  has been authorized for detection and/or diagnosis of SARS-CoV-2 by  FDA under an Emergency Use Authorization (EUA). This EUA will remain  in effect (meaning this test can be used) for the duration of the  Covid-19 declaration under Section 564(b)(1) of the Act, 21  U.S.C. section 360bbb-3(b)(1), unless the authorization is  terminated or revoked. Performed at [facility], [address] [place], [place] [address]   MRSA PCR Screening      Status: None   Collection Time: 11/11/19 11:31 AM   Specimen: Nasopharyngeal  Result Value Ref Range Status   MRSA by PCR NEGATIVE NEGATIVE Final    Comment:        The GeneXpert MRSA Assay (FDA approved for NASAL specimens only), is one component of a comprehensive MRSA colonization surveillance program. It is not intended to diagnose MRSA infection nor to guide or monitor treatment for MRSA infections. Performed at [facility],  [place], [place] [address]     Coagulation Studies: Recent Labs    11/14/19 0502  LABPROT 17.4*  INR 1.4*    Urinalysis: No results for input(s): COLORURINE, LABSPEC, PHURINE, GLUCOSEU, HGBUR, BILIRUBINUR, KETONESUR, PROTEINUR, UROBILINOGEN, NITRITE, LEUKOCYTESUR in the last 72 hours.  Invalid input(s): APPERANCEUR    Imaging: PERIPHERAL VASCULAR CATHETERIZATION  Result Date: 11/14/2019 See op note  DG Chest Port 1 View  Result Date: 11/15/2019 CLINICAL DATA:  Short of breath. EXAM: PORTABLE CHEST 1 VIEW COMPARISON:  11/11/2019 FINDINGS: Right chest wall dialysis catheter is identified with tip at the cavoatrial junction. Heart size normal. Trace pleural effusions and mild interstitial edema identified. No airspace consolidation. IMPRESSION: Suspect mild CHF. Electronically Signed   By: Kerby Moors M.D.   On: 11/15/2019 10:53     Medications:   . sodium chloride Stopped (11/15/19 1812)  .  ceFAZolin (ANCEF) IV 1 g (11/15/19 1812)   . sodium chloride   Intravenous Once  . atorvastatin  20 mg Oral Daily  . Chlorhexidine Gluconate Cloth  6 each Topical Daily  . citalopram  20 mg Oral Daily  . epoetin (EPOGEN/PROCRIT) injection  10,000 Units Intravenous Q M,W,F-HD  . fluticasone  2 spray Each Nare Daily  . gabapentin  200 mg Oral QHS  . influenza vac split quadrivalent PF  0.5 mL Intramuscular Tomorrow-1000  . insulin aspart  0-5 Units Subcutaneous QHS  . insulin aspart  0-9 Units Subcutaneous TID WC  .  lipase/protease/amylase  36,000 Units Oral TID WC  . pantoprazole  40 mg Oral Daily  . pneumococcal 23 valent vaccine  0.5 mL Intramuscular Tomorrow-1000  . sevelamer carbonate  2,400 mg Oral TID WC  . sodium chloride flush  10-40 mL Intracatheter Q12H   sodium chloride, ipratropium-albuterol, ondansetron (ZOFRAN) IV, oxyCODONE, phenol, sodium chloride flush  Assessment/ Plan:  [name] is a 59 y.o. black female with end stage renal disease on hemodialysis for last fifteen years, pancreatic insufficiency, hypertension, peripheral vascular disease, GERD, diabetes mellitus type II,  with past medical history of ESRD on HD MWF, anemia of chronic kidney disease, COPD, hyperlipidemia who is admitted to [facility] on 11/11/2019 for Hemorrhagic shock ([place]) [R57.8] ESRD on hemodialysis ([place]) [B63.8, L37.3] Complication of arteriovenous dialysis fistula, initial encounter [T82.9XXA]  [facility] MWF [facility] 86.5kg  1.  ESRD on HD MWF:   With complication of dialysis device. Rupture of AVG ([place]). Status post ligation by Dr. Delana Meyer on 2/20. Suspected underlying graft infection, patient reports purulent drainage prior to admission. No cultures were sent - dialysis for today, keep MWF schedule  2.  Hypertension. 156/74 - amlodipine and carvedilol.   3.  Anemia of chronic kidney disease with acute blood loss. Hemoglobin 6.9 Status post 2 units PRBC and 1 unit FFP on 2/19. 1 unit PRBC on 2/21. PRBC transfusion for today.  - EPO with HD treatment  4.  Secondary hyperparathyroidism:  - sevelamer    LOS: 5 Gailene Youkhana 2/24/202111:17 AM

## 2019-11-16 NOTE — Progress Notes (Signed)
[name] MRN: [id number] DOB: 1961-07-14   Cancelled Treatment:    Reason Eval/Treat Not Completed: Other (comment). Pt with Hgb at 6.9 this date this date pending blood transfusion. Will hold at this time and re-attempt one final time next date if medically stable.   Nunzio Banet 11/16/2019, 8:08 AM  [name], PT, DPT [phone]

## 2019-11-16 NOTE — Progress Notes (Signed)
Pt c/o of nausea and notified this RN that she is allergic to Zofran and tjhat she was taking Phenergan at home. MD was notfied and gave me a verbal order  of phenergan oral 12.5 mg PO. PRN Q8H

## 2019-11-16 NOTE — Progress Notes (Signed)
Post HD Tx    11/16/19 1320  Hand-Off documentation  Report given to (Full Name) Jacquelin Hawking, RN   Report received from (Full Name) Beatris Ship, RN   Vital Signs  Temp 98.1 F (36.7 C)  Temp Source Oral  Pulse Rate 80  Pulse Rate Source Monitor  Resp (!) 9  BP (!) 166/51  BP Location Right Arm  BP Method Automatic  Patient Position (if appropriate) Lying  Oxygen Therapy  SpO2 100 %  O2 Device Room Air  Pain Assessment  Pain Scale 0-10  Pain Score 0  Dialysis Weight  Weight 91.8 kg  Type of Weight Post-Dialysis  Post-Hemodialysis Assessment  Rinseback Volume (mL) 250 mL  KECN 38.2 V  Dialyzer Clearance Clear  Duration of HD Treatment -hour(s) 2.5 hour(s)  Hemodialysis Intake (mL) 500 mL  UF Total -Machine (mL) 1500 mL  Net UF (mL) 1000 mL  Tolerated HD Treatment Yes  Hemodialysis Catheter Right Subclavian  Placement Date/Time: 11/14/19 1648   Time Out: Correct patient;Correct site;Correct procedure  Maximum sterile barrier precautions: Hand hygiene;Cap;Mask;Sterile gown;Sterile gloves;Large sterile sheet  Site Prep: Chlorhexidine (preferred)  Local Anes...  Site Condition No complications  Blue Lumen Status Heparin locked  Red Lumen Status Heparin locked  Post treatment catheter status Capped and Clamped

## 2019-11-16 NOTE — Progress Notes (Signed)
OT Cancellation Note  Patient Details Name: [name] MRN: [id number] DOB: Oct 07, 1960   Cancelled Treatment:    Reason Eval/Treat Not Completed: Medical issues which prohibited therapy(Pt with Hgb at 6.9 this date this date pending blood transfusion. Will hold at this time and re-attempt next date if medically stable.)   Shara Blazing, M.S., OTR/L Ascom: [phone] 11/16/19, 8:38 AM

## 2019-11-16 NOTE — Progress Notes (Signed)
Pt requested to have surgeon remove the trialysis access. Night RN made aware.

## 2019-11-16 NOTE — Progress Notes (Signed)
PROGRESS NOTE    [name]  IZT:[id number] DOB: 04/15/1961 DOA: 11/11/2019 PCP: Freddy Finner, NP   Brief Narrative:  59 year old female with end-stage renal disease on hemodialysis (M, W, F), peripheral vascular disease, hypertension, type 2 diabetes mellitus with peripheral neuropathy and obesity presented with uncontrollable bleeding from left upper extremity AV fistula.  When EMS arrived they noticed copious amount of blood.  She was found to be minimally responsive and in hemorrhagic shock.  Her hemoglobin was 5.5.  She received 2 unit PRBC and taken to the OR by vascular surgeon and underwent excision of the infected left arm brachial artery AV graft and placement of wound VAC.  She was also found to be bradycardic in the 40s during the procedure and she was intubated and transferred to ICU. 2D echo done showed EF of 55-60% with grade 2 diastolic dysfunction.  Cardiology consult was called and suggested this was reflex bradycardia in the setting of hemorrhagic shock and no indication for a pacemaker. Heart rate has remained stable and patient transferred to hospital service on 2/21.  Subjective: Patient was feeling tired and complaining of pain in the left arm when seen this morning.  Assessment & Plan:   Active Problems:   Hemorrhagic shock ([place])   ESRD on hemodialysis ([place])   Complication of vascular access for dialysis   PVD (peripheral vascular disease) (H[facility])   Severe anemia   Pressure injury of skin  Ruptured and infected left AV graft with bleeding. S/p excision of left arm AV graft on 2/20 and wound VAC applied.  Nontunneled HD catheter in the right femoral vein initially placed followed by an EJ PermCath placed (due to stents in the left subclavian and innominate vein and right IJ being small and occluded). Vascular recommends she may need a femoral access in the future Wound care following. Surgical pathology with inflammatory changes but no cultures report. -Continue  empiric IV Ancef for infected AV graft for 2 more days.  Anemia.  Multifactorial with renal disease and blood loss. Hemoglobin 6.9 this morning.  Patient received total of 3 units packed RBC and 1 units of FFP during current hospitalization. -Transfuse 1 unit PRBC. -Check iron studies-iron within normal limit. -Continue to monitor.  ESRD on hemodialysis (M, W, F) Received HD from right IJ permacath on 2/22. Going for dialysis today.  Shortness of breath.  Improved. Chest x-ray with mild pulmonary vascular congestion. She was saturating well on room air today. Going for dialysis.   Hypotension.  Resolved.  Blood pressure elevated.  Hypertension.  Restart home dose of Coreg and amlodipine as her blood pressure are elevated.  Chronic pancreatitis Continue Creon  Peripheral neuropathy Continue gabapentin  Thrombocytopenia Mild.  Monitor for now.  Objective: Vitals:   11/15/19 2127 11/15/19 2129 11/16/19 0420 11/16/19 0453  BP: (!) 147/30 (!) 133/40  (!) 146/48  Pulse: 60 (!) 58  (!) 58  Resp: 20   18  Temp: 98.6 F (37 C)   98.4 F (36.9 C)  TempSrc: Oral   Oral  SpO2: 100%   100%  Weight:   92 kg   Height:        Intake/Output Summary (Last 24 hours) at 11/16/2019 0740 Last data filed at 11/16/2019 0500 Gross per 24 hour  Intake 238 ml  Output 2500 ml  Net -2262 ml   Filed Weights   11/14/19 0500 11/15/19 1045 11/16/19 0420  Weight: 88.8 kg 88.8 kg 92 kg    Examination:  General  exam: Chronically ill-appearing lady, appears calm and comfortable  Respiratory system: Clear to auscultation. Respiratory effort normal. Cardiovascular system: S1 & S2 heard, RRR. No JVD, murmurs, rubs, gallops or clicks. Gastrointestinal system: Soft, nontender, nondistended, bowel sounds positive. Central nervous system: Alert and oriented. No focal neurological deficits.Symmetric 5 x 5 power. Extremities: Left upper extremity with clean bandage, extensive bruising and a  wound VAC with bloody secretions, bilateral lower extremity with trace edema, dry scaly skin with chronic venous dermatitis, pulses intact and symmetrical. Psychiatry: Judgement and insight appear normal.   DVT prophylaxis: SCDs Code Status: Full Family Communication: No Family at bedside Disposition Plan: Pending improvement and establishing home health care for wound care.  Most likely can go home tomorrow if everything got set up.  Consultants:   Nephrology  Vascular surgery  Procedures: Excision of left arm AV fistula and wound VAC application,  right IJ permacath,  2D echo   Antimicrobials:  Cefazolin  Data Reviewed: I have personally reviewed following labs and imaging studies  CBC: Recent Labs  Lab 11/13/19 2017 11/13/19 2017 11/14/19 0308 11/14/19 0502 11/14/19 0827 11/14/19 1439 11/15/19 0518 11/15/19 0957 11/16/19 0417  WBC 5.9   < > 5.8   < > 5.7 5.8 5.9 5.2 5.3  NEUTROABS 3.3  --  3.2  --  3.3 3.6 3.6  --   --   HGB 7.5*   < > 7.4*   < > 7.3* 7.5* 7.1* 7.1* 6.9*  HCT 23.4*   < > 22.9*   < > 23.2* 23.7* 22.8* 23.1* 22.3*  MCV 84.2   < > 83.6   < > 84.7 84.9 86.7 87.2 87.5  PLT 102*   < > 102*   < > 103* 103* 108* 109* 132*   < > = values in this interval not displayed.   Basic Metabolic Panel: Recent Labs  Lab 11/11/19 1325 11/12/19 0600 11/14/19 0900 11/15/19 0957 11/16/19 0417  NA 132* 135 135 136 138  K 4.1 4.3 4.4 3.9 3.5  CL 98 101 94* 97* 99  CO2 24 24 32 32 34*  GLUCOSE 200* 58* 105* 115* 83  BUN 27* 31* 18 11 6   CREATININE 6.89* 7.71* 5.73* 3.98* 2.89*  CALCIUM 6.8* 7.2* 7.7* 7.9* 7.9*  MG 2.0 2.0  --   --   --   PHOS  --  5.2* 4.5 3.2  --    GFR: Estimated Creatinine Clearance: 23.2 mL/min (A) (by C-G formula based on SCr of 2.89 mg/dL (H)). Liver Function Tests: Recent Labs  Lab 11/11/19 0612 11/11/19 1325 11/14/19 0900 11/15/19 0957  AST 13* 15  --   --   ALT 9 9  --   --   ALKPHOS 49 41  --   --   BILITOT 0.8 1.1  --    --   PROT 4.4* 4.7*  --   --   ALBUMIN 2.2* 2.8* 2.4* 2.5*   No results for input(s): LIPASE, AMYLASE in the last 168 hours. No results for input(s): AMMONIA in the last 168 hours. Coagulation Profile: Recent Labs  Lab 11/11/19 0612 11/11/19 1132 11/12/19 0600 11/14/19 0502  INR 1.4* 1.5* 1.4* 1.4*   Cardiac Enzymes: No results for input(s): CKTOTAL, CKMB, CKMBINDEX, TROPONINI in the last 168 hours. BNP (last 3 results) No results for input(s): PROBNP in the last 8760 hours. HbA1C: Recent Labs    11/15/19 0957  HGBA1C 4.7*   CBG: Recent Labs  Lab 11/15/19 0407 11/15/19 5329  11/15/19 1456 11/15/19 1638 11/15/19 2125  GLUCAP 133* 115* 70 114* 149*   Lipid Profile: No results for input(s): CHOL, HDL, LDLCALC, TRIG, CHOLHDL, LDLDIRECT in the last 72 hours. Thyroid Function Tests: No results for input(s): TSH, T4TOTAL, FREET4, T3FREE, THYROIDAB in the last 72 hours. Anemia Panel: No results for input(s): VITAMINB12, FOLATE, FERRITIN, TIBC, IRON, RETICCTPCT in the last 72 hours. Sepsis Labs: No results for input(s): PROCALCITON, LATICACIDVEN in the last 168 hours.  Recent Results (from the past 240 hour(s))  Respiratory Panel by RT PCR (Flu A&B, Covid) - Nasopharyngeal Swab     Status: None   Collection Time: 11/11/19  7:00 AM   Specimen: Nasopharyngeal Swab  Result Value Ref Range Status   SARS Coronavirus 2 by RT PCR NEGATIVE NEGATIVE Final    Comment: (NOTE) SARS-CoV-2 target nucleic acids are NOT DETECTED. The SARS-CoV-2 RNA is generally detectable in upper respiratoy specimens during the acute phase of infection. The lowest concentration of SARS-CoV-2 viral copies this assay can detect is 131 copies/mL. A negative result does not preclude SARS-Cov-2 infection and should not be used as the sole basis for treatment or other patient management decisions. A negative result may occur with  improper specimen collection/handling, submission of specimen other than  nasopharyngeal swab, presence of viral mutation(s) within the areas targeted by this assay, and inadequate number of viral copies (<131 copies/mL). A negative result must be combined with clinical observations, patient history, and epidemiological information. The expected result is Negative. Fact Sheet for Patients:  PinkCheek.be Fact Sheet for Healthcare Providers:  GravelBags.it This test is not yet ap proved or cleared by the [place] FDA and  has been authorized for detection and/or diagnosis of SARS-CoV-2 by FDA under an Emergency Use Authorization (EUA). This EUA will remain  in effect (meaning this test can be used) for the duration of the COVID-19 declaration under Section 564(b)(1) of the Act, 21 U.S.C. section 360bbb-3(b)(1), unless the authorization is terminated or revoked sooner.    Influenza A by PCR NEGATIVE NEGATIVE Final   Influenza B by PCR NEGATIVE NEGATIVE Final    Comment: (NOTE) The Xpert Xpress SARS-CoV-2/FLU/RSV assay is intended as an aid in  the diagnosis of influenza from Nasopharyngeal swab specimens and  should not be used as a sole basis for treatment. Nasal washings and  aspirates are unacceptable for Xpert Xpress SARS-CoV-2/FLU/RSV  testing. Fact Sheet for Patients: PinkCheek.be Fact Sheet for Healthcare Providers: GravelBags.it This test is not yet approved or cleared by the [place] FDA and  has been authorized for detection and/or diagnosis of SARS-CoV-2 by  FDA under an Emergency Use Authorization (EUA). This EUA will remain  in effect (meaning this test can be used) for the duration of the  Covid-19 declaration under Section 564(b)(1) of the Act, 21  U.S.C. section 360bbb-3(b)(1), unless the authorization is  terminated or revoked. Performed at [facility], [address] [place], [place] [address]   MRSA  PCR Screening     Status: None   Collection Time: 11/11/19 11:31 AM   Specimen: Nasopharyngeal  Result Value Ref Range Status   MRSA by PCR NEGATIVE NEGATIVE Final    Comment:        The GeneXpert MRSA Assay (FDA approved for NASAL specimens only), is one component of a comprehensive MRSA colonization surveillance program. It is not intended to diagnose MRSA infection nor to guide or monitor treatment for MRSA infections. Performed at [facility], [address],  [place], [place] [address]      Radiology Studies: PERIPHERAL VASCULAR CATHETERIZATION  Result Date: 11/14/2019 See op note  DG Chest Port 1 View  Result Date: 11/15/2019 CLINICAL DATA:  Short of breath. EXAM: PORTABLE CHEST 1 VIEW COMPARISON:  11/11/2019 FINDINGS: Right chest wall dialysis catheter is identified with tip at the cavoatrial junction. Heart size normal. Trace pleural effusions and mild interstitial edema identified. No airspace consolidation. IMPRESSION: Suspect mild CHF. Electronically Signed   By: Kerby Moors M.D.   On: 11/15/2019 10:53    Scheduled Meds: . atorvastatin  20 mg Oral Daily  . Chlorhexidine Gluconate Cloth  6 each Topical Daily  . citalopram  20 mg Oral Daily  . fluticasone  2 spray Each Nare Daily  . gabapentin  200 mg Oral QHS  . influenza vac split quadrivalent PF  0.5 mL Intramuscular Tomorrow-1000  . insulin aspart  0-5 Units Subcutaneous QHS  . insulin aspart  0-9 Units Subcutaneous TID WC  . lipase/protease/amylase  36,000 Units Oral TID WC  . pantoprazole  40 mg Oral Daily  . pneumococcal 23 valent vaccine  0.5 mL Intramuscular Tomorrow-1000  . sevelamer carbonate  2,400 mg Oral TID WC  . sodium chloride flush  10-40 mL Intracatheter Q12H   Continuous Infusions: . sodium chloride Stopped (11/15/19 1812)  .  ceFAZolin (ANCEF) IV 1 g (11/15/19 1812)     LOS: 5 days   Time spent: 40 minutes  Lorella Nimrod, MD [facility]  If 7PM-7AM, please  contact night-coverage [email or web address]  11/16/2019, 7:40 AM   This record has been created using Systems analyst. Errors have been sought and corrected,but may not always be located. Such creation errors do not reflect on the standard of care.

## 2019-11-16 NOTE — Progress Notes (Signed)
HD Tx started w/o complication    09/32/35 1000  Vital Signs  Pulse Rate 80  Resp 19  BP 124/79  BP Location Right Arm  BP Method Automatic  Patient Position (if appropriate) Lying  Oxygen Therapy  SpO2 100 %  O2 Device Room Air  During Hemodialysis Assessment  Blood Flow Rate (mL/min) 400 mL/min  Arterial Pressure (mmHg) -180 mmHg  Venous Pressure (mmHg) 160 mmHg  Transmembrane Pressure (mmHg) 60 mmHg  Ultrafiltration Rate (mL/min) 560 mL/min  Dialysate Flow Rate (mL/min) 600 ml/min  Conductivity: Machine  14  HD Safety Checks Performed Yes  Dialysis Fluid Bolus Normal Saline  Bolus Amount (mL) 250 mL  Intra-Hemodialysis Comments Tx initiated

## 2019-11-16 NOTE — Progress Notes (Signed)
Patient is requesting a clinic transfer due to last few weeks experience at home clinic, however the clinic that the patient is requesting states that they can not take her until April. Attempted to speak with patient about this today to figure out a new plan, but patient is too drowsy. Will try to speak with her again tomorrow.

## 2019-11-17 LAB — CBC
HCT: 29.5 % — ABNORMAL LOW (ref 36.0–46.0)
Hemoglobin: 9.3 g/dL — ABNORMAL LOW (ref 12.0–15.0)
MCH: 27.4 pg (ref 26.0–34.0)
MCHC: 31.5 g/dL (ref 30.0–36.0)
MCV: 87 fL (ref 80.0–100.0)
Platelets: 167 10*3/uL (ref 150–400)
RBC: 3.39 MIL/uL — ABNORMAL LOW (ref 3.87–5.11)
RDW: 16.6 % — ABNORMAL HIGH (ref 11.5–15.5)
WBC: 5.7 10*3/uL (ref 4.0–10.5)
nRBC: 0 % (ref 0.0–0.2)

## 2019-11-17 LAB — RENAL FUNCTION PANEL
Albumin: 2.5 g/dL — ABNORMAL LOW (ref 3.5–5.0)
Anion gap: 8 (ref 5–15)
BUN: 5 mg/dL — ABNORMAL LOW (ref 6–20)
CO2: 33 mmol/L — ABNORMAL HIGH (ref 22–32)
Calcium: 8 mg/dL — ABNORMAL LOW (ref 8.9–10.3)
Chloride: 96 mmol/L — ABNORMAL LOW (ref 98–111)
Creatinine, Ser: 3.1 mg/dL — ABNORMAL HIGH (ref 0.44–1.00)
GFR calc Af Amer: 18 mL/min — ABNORMAL LOW (ref >60)
GFR calc non Af Amer: 16 mL/min — ABNORMAL LOW (ref >60)
Glucose, Bld: 70 mg/dL (ref 70–99)
Phosphorus: 2.1 mg/dL — ABNORMAL LOW (ref 2.5–4.6)
Potassium: 3.2 mmol/L — ABNORMAL LOW (ref 3.5–5.1)
Sodium: 137 mmol/L (ref 135–145)

## 2019-11-17 LAB — GLUCOSE, CAPILLARY
Glucose-Capillary: 59 mg/dL — ABNORMAL LOW (ref 70–99)
Glucose-Capillary: 59 mg/dL — ABNORMAL LOW (ref 70–99)
Glucose-Capillary: 128 mg/dL — ABNORMAL HIGH (ref 70–99)
Glucose-Capillary: 80 mg/dL (ref 70–99)
Glucose-Capillary: 92 mg/dL (ref 70–99)
Glucose-Capillary: 102 mg/dL — ABNORMAL HIGH (ref 70–99)

## 2019-11-17 LAB — BPAM RBC
Blood Product Expiration Date: 202103172359
Blood Product Expiration Date: 202103262359
ISSUE DATE / TIME: 202102211415
ISSUE DATE / TIME: 202102241103
Unit Type and Rh: 5100
Unit Type and Rh: 5100

## 2019-11-17 LAB — TYPE AND SCREEN
ABO/RH(D): O POS
Antibody Screen: NEGATIVE
Unit division: 0
Unit division: 0

## 2019-11-17 LAB — SARS CORONAVIRUS 2 (TAT 6-24 HRS): SARS Coronavirus 2: NEGATIVE

## 2019-11-17 MED ORDER — DEXTROSE 50 % IV SOLN
12.5000 g | Freq: Once | INTRAVENOUS | Status: AC
  Administered 2019-11-17: 12.5 g via INTRAVENOUS
  Filled 2019-11-17: qty 50

## 2019-11-17 MED ORDER — PROMETHAZINE HCL 12.5 MG PO TABS: 12.5000 mg | ORAL_TABLET | Freq: Three times a day (TID) | ORAL | 0 refills | Status: DC | PRN

## 2019-11-17 MED ORDER — OXYCODONE HCL 5 MG PO TABS
5.0000 mg | ORAL_TABLET | Freq: Once | ORAL | Status: AC
  Administered 2019-11-17: 5 mg via ORAL
  Filled 2019-11-17: qty 1

## 2019-11-17 MED ORDER — CHLORHEXIDINE GLUCONATE CLOTH 2 % EX PADS
6.0000 | MEDICATED_PAD | Freq: Every day | CUTANEOUS | Status: DC
  Administered 2019-11-18: 6 via TOPICAL

## 2019-11-17 MED ORDER — SEVELAMER CARBONATE 800 MG PO TABS: 2400.0000 mg | ORAL_TABLET | Freq: Three times a day (TID) | ORAL | 0 refills | Status: DC

## 2019-11-17 NOTE — TOC Progression Note (Signed)
Transition of Care [facility]) - Progression Note    Patient Details  Name: [name] MRN: [id number] Date of Birth: [birth year]  Transition of Care [facility]) CM/SW [place], LCSW Phone Number: 11/17/2019, 3:38 PM  Clinical Narrative: PT recommending SNF placement. Patient stated she does not want to go but understands she is not safe to be alone at home right now. First preference is [facility]. Admissions coordinator is aware and will review. Second preference is [facility]. Called and updated patient's son. He is agreeable.  Expected Discharge Plan: [place] Barriers to Discharge: Barriers Resolved  Expected Discharge Plan and Services Expected Discharge Plan: [place], Durable Medical Equipment Living arrangements for the past 2 months: Apartment Expected Discharge Date: 11/17/19               DME Arranged: Harlin Heys DME Agency: [employer] Date DME Agency Contacted: 11/17/19   Representative spoke with at DME Agency: [facility] HH Arranged: RN, PT, OT, Social Work [employer] Agency: [place] Date [place]: 11/17/19   Representative spoke with at [place]: [place] ([place]) Interventions    Readmission Risk Interventions No flowsheet data found.

## 2019-11-17 NOTE — NC FL2 (Signed)
[place] LEVEL OF CARE SCREENING TOOL     IDENTIFICATION  Patient Name: [name] Birthdate: March 04, 1961 Sex: female Admission Date (Current Location): 11/11/2019  [place] and [place] Number:  Engineering geologist and Address:  [facility], [address] [place], [place] [address]      Provider Number: [id number]  Attending Physician Name and Address:  Lorella Nimrod, MD  Relative Name and Phone Number:       Current Level of Care: Hospital Recommended Level of Care: [place] Prior Approval Number:    Date Approved/Denied:   PASRR Number: [id number]  Discharge Plan: SNF    Current Diagnoses: Patient Active Problem List   Diagnosis Date Noted  . Shortness of breath   . Complication of arteriovenous dialysis fistula 11/13/2019  . PVD (peripheral vascular disease) ([place]) 11/13/2019  . Severe anemia 11/13/2019  . Pressure injury of skin 11/13/2019  . ESRD on hemodialysis ([place])   . Hemorrhagic shock ([place]) 11/11/2019    Orientation RESPIRATION BLADDER Height & Weight     Self, Time, Situation, Place  Normal Continent Weight: 192 lb 10.9 oz (87.4 kg) Height:  5\' 5"  (165.1 cm)  BEHAVIORAL SYMPTOMS/MOOD NEUROLOGICAL BOWEL NUTRITION STATUS  (None) (None) Continent Diet(Renal/carb modified with fluid restriction: 1200 mL.)  AMBULATORY STATUS COMMUNICATION OF NEEDS Skin   Extensive Assist Verbally Wound Vac, Other (Comment), Surgical wounds, PU Stage and Appropriate Care(Cracking, fissure. Vac on left anterior upper arm: 9 cm x 4 cm x 0.4 cm) PU Stage 1 Dressing: (Medial Sacrum: Silicone Dressing.)                     Personal Care Assistance Level of Assistance  Bathing, Feeding, Dressing Bathing Assistance: Maximum assistance Feeding assistance: Limited assistance Dressing Assistance: Maximum assistance     Functional Limitations Info  Sight, Hearing, Speech Sight Info: Adequate Hearing Info:  Adequate Speech Info: Adequate    SPECIAL CARE FACTORS FREQUENCY  PT (By licensed PT), OT (By licensed OT)     PT Frequency: 5 x week OT Frequency: 5 x week            Contractures Contractures Info: Not present    Additional Factors Info  Code Status, Allergies Code Status Info: Full code Allergies Info: Dilaudid (Hydromorphone), Zofran (Ondansetron)           Current Medications (11/17/2019):  This is the current hospital active medication list Current Facility-Administered Medications  Medication Dose Route Frequency Provider Last Rate Last Admin  . 0.9 %  sodium chloride infusion (Manually program via Guardrails IV Fluids)   Intravenous Once Lorella Nimrod, MD      . 0.9 %  sodium chloride infusion   Intravenous PRN Dhungel, Nishant, MD 10 mL/hr at 11/16/19 1707 250 mL at 11/16/19 1707  . atorvastatin (LIPITOR) tablet 20 mg  20 mg Oral Daily Algernon Huxley, MD   20 mg at 11/17/19 1517  . ceFAZolin (ANCEF) IVPB 1 g/50 mL premix  1 g Intravenous Q24H Dhungel, Nishant, MD 100 mL/hr at 11/16/19 1709 1 g at 11/16/19 1709  . citalopram (CELEXA) tablet 20 mg  20 mg Oral Daily Algernon Huxley, MD      . epoetin alfa (EPOGEN) injection 10,000 Units  10,000 Units Intravenous Q M,W,F-HD Lavonia Dana, MD   10,000 Units at 11/16/19 1154  . fluticasone (FLONASE) 50 MCG/ACT nasal spray 2 spray  2 spray Each Nare Daily Dew, Erskine Squibb, MD      .  gabapentin (NEURONTIN) capsule 200 mg  200 mg Oral QHS Algernon Huxley, MD   200 mg at 11/15/19 2139  . influenza vac split quadrivalent PF (FLUARIX) injection 0.5 mL  0.5 mL Intramuscular Tomorrow-1000 Vernard Gambles L, MD      . insulin aspart (novoLOG) injection 0-5 Units  0-5 Units Subcutaneous QHS Ouma, Bing Neighbors, NP      . insulin aspart (novoLOG) injection 0-9 Units  0-9 Units Subcutaneous TID WC Ouma, Bing Neighbors, NP      . ipratropium-albuterol (DUONEB) 0.5-2.5 (3) MG/3ML nebulizer solution 3 mL  3 mL Nebulization Q6H PRN Algernon Huxley, MD      . lipase/protease/amylase (CREON) capsule 36,000 Units  36,000 Units Oral TID WC Algernon Huxley, MD   36,000 Units at 11/17/19 1216  . oxyCODONE (Oxy IR/ROXICODONE) immediate release tablet 5 mg  5 mg Oral Q4H PRN Algernon Huxley, MD   5 mg at 11/17/19 0953  . pantoprazole (PROTONIX) EC tablet 40 mg  40 mg Oral Daily Algernon Huxley, MD   40 mg at 11/17/19 0827  . phenol (CHLORASEPTIC) mouth spray 1 spray  1 spray Mouth/Throat PRN Algernon Huxley, MD   1 spray at 11/11/19 2102  . pneumococcal 23 valent vaccine (PNEUMOVAX-23) injection 0.5 mL  0.5 mL Intramuscular Tomorrow-1000 Tyler Pita, MD      . promethazine (PHENERGAN) injection 12.5 mg  12.5 mg Intravenous Q6H PRN Lorella Nimrod, MD   12.5 mg at 11/17/19 1209  . promethazine (PHENERGAN) tablet 12.5 mg  12.5 mg Oral Q8H PRN Lorella Nimrod, MD   12.5 mg at 11/16/19 1539  . sevelamer carbonate (RENVELA) tablet 2,400 mg  2,400 mg Oral TID WC Algernon Huxley, MD   2,400 mg at 11/17/19 1215  . sodium chloride flush (NS) 0.9 % injection 10-40 mL  10-40 mL Intracatheter Q12H Algernon Huxley, MD   10 mL at 11/17/19 0828  . sodium chloride flush (NS) 0.9 % injection 10-40 mL  10-40 mL Intracatheter PRN Lucky Cowboy, Erskine Squibb, MD         Discharge Medications: Please see discharge summary for a list of discharge medications.  Relevant Imaging Results:  Relevant Lab Results:   Additional Information SS#: [id number]. HD DVA [place] MWF 11:00.  Candie Chroman, LCSW

## 2019-11-17 NOTE — Progress Notes (Signed)
[facility] Daily Progress Note   Subjective: 11/11/19: 1. Insertion of triple-lumen catheter left femoral vein with ultrasound guidance. 2.Excision of infected left arm brachial axillary AV graft  11/14/19: 1. Ultrasound guidance for vascular access to the rightexternaljugular vein 2. Right jugular venogram and superior venacavogram 3. Fluoroscopic guidance for placement of catheter 4. Placement of a19cm tip to cuff tunneled hemodialysis catheter via the right externaljugular vein  No issues overnight. Continued LUE wound discomfort.   Objective: Vitals:   11/16/19 2119 11/16/19 2233 11/17/19 0500 11/17/19 0545  BP: (!) 167/57 (!) 167/54  (!) 162/56  Pulse: 61 66  61  Resp: 20   12  Temp: 98.9 F (37.2 C)   97.9 F (36.6 C)  TempSrc: Oral   Oral  SpO2: 100% 95%  100%  Weight:   87.4 kg   Height:        Intake/Output Summary (Last 24 hours) at 11/17/2019 1034 Last data filed at 11/16/2019 2233 Gross per 24 hour  Intake 730 ml  Output 1000 ml  Net -270 ml   Physical Exam: A&Ox3, NAD Chest:              Right External Jugular Permcath: Intact, clean and dry.  CV: RRR Pulmonary: CTA Bilaterally Abdomen: Soft, Nontender, Nondistended Groin:  Right Cental Line Removed Vascular:             Left Upper Extremity: Arm soft, VAC intact and to suction. Proximal and distal incision - staples clean and dry.    Laboratory: CBC    Component Value Date/Time   WBC 5.7 11/17/2019 0538   HGB 9.3 (L) 11/17/2019 0538   HCT 29.5 (L) 11/17/2019 0538   PLT 167 11/17/2019 0538   BMET    Component Value Date/Time   NA 137 11/17/2019 0538   K 3.2 (L) 11/17/2019 0538   CL 96 (L) 11/17/2019 0538   CO2 33 (H) 11/17/2019 0538   GLUCOSE 70 11/17/2019 0538   BUN 5 (L) 11/17/2019 0538   CREATININE 3.10 (H) 11/17/2019 0538   CALCIUM 8.0 (L) 11/17/2019 0538   GFRNONAA 16 (L) 11/17/2019 0538   GFRAA 18 (L) 11/17/2019 0538   Assessment/Planning: The  patient is a 59 year old female who presented to the [facility] emergency department on November 11, 2019 with an infected and hemorrhaging left upper extremity dialysis access graft. This was removed - (11/11/19), right external jugular permcath placed - (11/14/19), Right Groin Central Line Removed - 11/17/19  1) Left Upper Extremity Wound: VAC changes T-TH-S Wound healing.  Continue changes in outpatient setting  2) ESRD: Permcath placed in right external jugular vein If fails, will need femoral permcath Right Groin Central line removed this AM without issues  3) Ambulation: Spoke with patient about OOB and ambulation today STAT PT ordered  Discussed with [employer] PA-C 11/17/2019 10:34 AM

## 2019-11-17 NOTE — Care Management Important Message (Signed)
Important Message  Patient Details  Name: [name] MRN: [id number] Date of Birth: Sep 01, 1961   Medicare Important Message Given:  Yes     Dannette Barbara 11/17/2019, 2:32 PM

## 2019-11-17 NOTE — Discharge Summary (Addendum)
Physician Discharge Summary  JACLYNN LAUMANN BTD:[id number] DOB: 1961-07-04 DOA: 11/11/2019  PCP: Freddy Finner, NP  Admit date: 11/11/2019 Discharge date: 11/17/2019  Admitted From: Home Disposition: Home  Recommendations for Outpatient Follow-up:  1. Follow up with PCP in 1-2 weeks 2. Follow-up with vascular surgery. 3. Please obtain BMP/CBC in one week 4. Please follow up on the following pending results: None  Home Health: Yes Equipment/Devices: Wound VAC Discharge Condition: Stable CODE STATUS: Full Diet recommendation: Heart Healthy / Carb Modified   Brief/Interim Summary: 59 year old female with end-stage renal disease on hemodialysis (M, W, F), peripheral vascular disease, hypertension, type 2 diabetes mellitus with peripheral neuropathy and obesity presented with uncontrollable bleeding from left upper extremity AV fistula. When EMS arrived they noticed copious amount of blood. She was found to be minimally responsive and in hemorrhagic shock. Her hemoglobin was 5.5. She received 2 unit PRBC and taken to the OR by vascular surgeon and underwent excision of the infected left arm brachial artery AV graft and placement of wound VAC. She was also found to be bradycardic in the 40s during the procedure and she was intubated and transferred to ICU.  Hemoglobin and heart rate stabilizes and she was transferred out of ICU on 11/13/2019. She received initial dialysis with femoral access, later she was placed a right EJ permanent catheter by vascular surgery(due to stents in the left subclavian and innominate vein and right IJ being small and occluded).  Surgical pathology was with inflammatory changes but no culture was done.  She was treated with empiric antibiotics and completed the course during hospitalization. Patient was discharged with wound VAC, and will follow up with vascular surgery as an outpatient.  For her blood loss anemia she received total of 4 units of PRBC and 1 unit of  FFP. Hemoglobin was stable at 9.5 on discharge.  Patient was on aspirin and Plavix at home.  She do have an history of carotid stenosis, could not find any recommendations regarding dual antiplatelet.  We discontinued the Plavix due to current bleeding.  She can resume aspirin.  Her cardiologist or vascular surgeon can restart Plavix if needed.  She will continue her routine dialysis on Monday, Wednesday and Friday through right EJ permanent catheter.  She will continue with rest of her home meds.  Initially patient was supposed to go home with home health services.  Later decided to go to SNF as patient lives alone and cannot take care of herself.  Discharge Diagnoses:  Active Problems:   Hemorrhagic shock ([place])   ESRD on hemodialysis ([place])   Complication of arteriovenous dialysis fistula   PVD (peripheral vascular disease) ([facility])   Severe anemia   Pressure injury of skin   Shortness of breath  Discharge Instructions  Discharge Instructions    Diet - low sodium heart healthy   Complete by: As directed    Discharge instructions   Complete by: As directed    It was pleasure taking care of you. I am discontinuing Plavix, could not find the reason why you have to take aspirin and Plavix both as they increase the chance of bleeding.  You can discuss with your cardiologist or vascular surgeon regarding restarting if needed. Please follow-up with your vascular surgeon according to your appointment. Continue to get dialysis according to your schedule. Follow-up with your PCP.   Increase activity slowly   Complete by: As directed      Allergies as of 11/17/2019      Reactions  Dilaudid [hydromorphone]    Zofran [ondansetron]    Per pt. she is allergic to zofran or will experience adverse reaction like hallucination       Medication List    STOP taking these medications   clopidogrel 75 MG tablet Commonly known as: PLAVIX   doxycycline 100 MG capsule Commonly known as:  VIBRAMYCIN   furosemide 80 MG tablet Commonly known as: LASIX     TAKE these medications   albuterol 108 (90 Base) MCG/ACT inhaler Commonly known as: VENTOLIN HFA Inhale 2 puffs into the lungs every 4 (four) hours as needed for wheezing or shortness of breath.   amLODipine 10 MG tablet Commonly known as: NORVASC Take 10 mg by mouth daily.   aspirin EC 81 MG tablet Take 81 mg by mouth daily.   atorvastatin 20 MG tablet Commonly known as: LIPITOR Take 20 mg by mouth daily.   carvedilol 6.25 MG tablet Commonly known as: COREG Take 6.25 mg by mouth 2 (two) times daily.   citalopram 20 MG tablet Commonly known as: CELEXA Take 20 mg by mouth daily.   Creon 12000 units Cpep capsule Generic drug: lipase/protease/amylase Take 3 Units by mouth 3 (three) times daily.   fluticasone 50 MCG/ACT nasal spray Commonly known as: FLONASE Place 2 sprays into both nostrils daily.   gabapentin 100 MG capsule Commonly known as: NEURONTIN Take 200 mg by mouth at bedtime.   HYDROcodone-acetaminophen 5-325 MG tablet Commonly known as: NORCO/VICODIN Take 1 tablet by mouth 4 (four) times daily as needed for pain.   meclizine 25 MG tablet Commonly known as: ANTIVERT Take 25 mg by mouth every 6 (six) hours as needed for dizziness.   mupirocin ointment 2 % Commonly known as: BACTROBAN Apply 1 application topically 3 (three) times daily.   pantoprazole 40 MG tablet Commonly known as: PROTONIX Take 40 mg by mouth daily.   promethazine 12.5 MG tablet Commonly known as: PHENERGAN Take 1 tablet (12.5 mg total) by mouth every 8 (eight) hours as needed for nausea.   sevelamer carbonate 800 MG tablet Commonly known as: RENVELA Take 3 tablets (2,400 mg total) by mouth 3 (three) times daily with meals.      Follow-up Information    [facility] Follow up.   Why: They will follow up with you for your home health needs. Contact information: [address] [place]  [place] [address] [phone]        Kris Hartmann, NP Follow up in 2 week(s).   Specialty: Vascular Surgery Why: First post-op wound check. Has VAC. No studies.  Contact information: [place] [address] [phone]          Allergies  Allergen Reactions  . Dilaudid [Hydromorphone]   . Zofran [Ondansetron]     Per pt. she is allergic to zofran or will experience adverse reaction like hallucination     Consultations:  Nephrology  Vascular surgery  Procedures/Studies: Excision of left arm AV fistula and wound VAC application,  right IJ permacath,  2D echo  PERIPHERAL VASCULAR CATHETERIZATION  Result Date: 11/14/2019 See op note  DG Chest Port 1 View  Result Date: 11/15/2019 CLINICAL DATA:  Short of breath. EXAM: PORTABLE CHEST 1 VIEW COMPARISON:  11/11/2019 FINDINGS: Right chest wall dialysis catheter is identified with tip at the cavoatrial junction. Heart size normal. Trace pleural effusions and mild interstitial edema identified. No airspace consolidation. IMPRESSION: Suspect mild CHF. Electronically Signed   By: Queen Slough.D.  On: 11/15/2019 10:53   DG Chest Port 1 View  Result Date: 11/11/2019 CLINICAL DATA:  Respiratory failure. EXAM: PORTABLE CHEST 1 VIEW COMPARISON:  09/30/2019. FINDINGS: Endotracheal tube noted with tip 5 cm above the carina. Heart size normal. Bilateral pulmonary infiltrates/edema. Small bilateral pleural effusion. No pneumothorax. Left subclavian stents noted. Surgical staples noted over the left chest. IMPRESSION: 1.  Endotracheal tube noted with tip 5 cm above the carina. 2.  Diffuse bilateral pulmonary infiltrates/edema. Electronically Signed   By: Marcello Moores  Register   On: 11/11/2019 12:00   ECHOCARDIOGRAM COMPLETE  Result Date: 11/12/2019    ECHOCARDIOGRAM REPORT   Patient Name:   FRANCENE MCERLEAN Date of Exam: 11/11/2019 Medical Rec #:  [id number]   Height:       63.0 in Accession #:    [id number]  Weight:       194.2 lb Date  of Birth:  [birth year]    BSA:          1.91 m Patient Age:    59 years    BP:           105/50 mmHg Patient Gender: F           HR:           49 bpm. Exam Location:  [facility] Procedure: 2D Echo, Cardiac Doppler and Color Doppler Indications:     Acute Resp. Insufficiency  History:         Patient has no prior history of Echocardiogram examinations.  Sonographer:     Alyse Low Roar Referring Phys:  [id number] CARMEN Veda Canning Diagnosing Phys: Mertie Moores MD IMPRESSIONS  1. Left ventricular ejection fraction, by estimation, is 55 to 60%. The left ventricle has normal function. The left ventricle has no regional wall motion abnormalities. Left ventricular diastolic parameters are consistent with Grade II diastolic dysfunction (pseudonormalization).  2. Right ventricular systolic function is normal. The right ventricular size is normal. There is moderately elevated pulmonary artery systolic pressure.  3. Left atrial size was mildly dilated.  4. The mitral valve is normal in structure and function. No evidence of mitral valve regurgitation. No evidence of mitral stenosis.  5. The aortic valve is normal in structure and function. Aortic valve regurgitation is not visualized. No aortic stenosis is present. FINDINGS  Left Ventricle: Left ventricular ejection fraction, by estimation, is 55 to 60%. The left ventricle has normal function. The left ventricle has no regional wall motion abnormalities. The left ventricular internal cavity size was small. There is no left ventricular hypertrophy. Left ventricular diastolic parameters are consistent with Grade II diastolic dysfunction (pseudonormalization). Right Ventricle: The right ventricular size is normal. No increase in right ventricular wall thickness. Right ventricular systolic function is normal. There is moderately elevated pulmonary artery systolic pressure. The tricuspid regurgitant velocity is 3.14 m/s, and with an assumed right atrial pressure of 10 mmHg, the estimated right  ventricular systolic pressure is 04.8 mmHg. Left Atrium: Left atrial size was mildly dilated. Right Atrium: Right atrial size was normal in size. Pericardium: There is no evidence of pericardial effusion. Mitral Valve: The mitral valve is normal in structure and function. No evidence of mitral valve regurgitation. No evidence of mitral valve stenosis. Tricuspid Valve: The tricuspid valve is normal in structure. Tricuspid valve regurgitation is mild. Aortic Valve: The aortic valve is normal in structure and function. Aortic valve regurgitation is not visualized. No aortic stenosis is present. Aortic valve mean gradient measures 4.0 mmHg. Aortic valve peak gradient measures  9.4 mmHg. Aortic valve area, by VTI measures 1.94 cm. Pulmonic Valve: The pulmonic valve was normal in structure. Pulmonic valve regurgitation is mild. Aorta: The aortic root and ascending aorta are structurally normal, with no evidence of dilitation. IAS/Shunts: The atrial septum is grossly normal.  LEFT VENTRICLE PLAX 2D LVIDd:         4.45 cm  Diastology LVIDs:         3.34 cm  LV e' lateral:   7.29 cm/s LV PW:         1.04 cm  LV E/e' lateral: 14.7 LV IVS:        1.23 cm  LV e' medial:    5.77 cm/s LVOT diam:     1.80 cm  LV E/e' medial:  18.5 LV SV:         64.64 ml LV SV Index:   22.12 LVOT Area:     2.54 cm  RIGHT VENTRICLE RV Mid diam:    2.99 cm RV S prime:     11.20 cm/s LEFT ATRIUM           Index LA diam:      4.40 cm 2.30 cm/m LA Vol (A4C): 58.5 ml 30.63 ml/m  AORTIC VALVE                   PULMONIC VALVE AV Area (Vmax):    1.81 cm    PV Vmax:        0.91 m/s AV Area (Vmean):   2.14 cm    PV Peak grad:   3.3 mmHg AV Area (VTI):     1.94 cm    RVOT Peak grad: 2 mmHg AV Vmax:           153.00 cm/s AV Vmean:          93.700 cm/s AV VTI:            0.334 m AV Peak Grad:      9.4 mmHg AV Mean Grad:      4.0 mmHg LVOT Vmax:         109.00 cm/s LVOT Vmean:        78.900 cm/s LVOT VTI:          0.254 m LVOT/AV VTI ratio: 0.76  AORTA Ao  Root diam: 2.60 cm MITRAL VALVE                TRICUSPID VALVE MV Area (PHT): 2.55 cm     TR Peak grad:   39.4 mmHg MV Decel Time: 298 msec     TR Vmax:        314.00 cm/s MV E velocity: 107.00 cm/s MV A velocity: 110.00 cm/s  SHUNTS MV E/A ratio:  0.97         Systemic VTI:  0.25 m                             Systemic Diam: 1.80 cm Mertie Moores MD Electronically signed by Mertie Moores MD Signature Date/Time: 11/12/2019/2:05:31 PM    Final     Subjective: Was seen in dialysis during morning rounds.  She did experience some chills earlier today but remained afebrile.  She was feeling better during dialysis, stating that today is her better day.  We discussed about her discharge to SNF which she agrees.  Discharge Exam: Vitals:   11/16/19 2233 11/17/19 0545  BP: (!) 167/54 (!) 162/56  Pulse:  66 61  Resp:  12  Temp:  97.9 F (36.6 C)  SpO2: 95% 100%   Vitals:   11/16/19 2119 11/16/19 2233 11/17/19 0500 11/17/19 0545  BP: (!) 167/57 (!) 167/54  (!) 162/56  Pulse: 61 66  61  Resp: 20   12  Temp: 98.9 F (37.2 C)   97.9 F (36.6 C)  TempSrc: Oral   Oral  SpO2: 100% 95%  100%  Weight:   87.4 kg   Height:        General: Pt is alert, awake, not in acute distress Cardiovascular: RRR, S1/S2 +, no rubs, no gallops Respiratory: CTA bilaterally, no wheezing, no rhonchi Abdominal: Soft, NT, ND, bowel sounds + Extremities: no edema, no cyanosis. LUE with clean bandage and wound VAC which was replaced yesterday.   The results of significant diagnostics from this hospitalization (including imaging, microbiology, ancillary and laboratory) are listed below for reference.    Microbiology: Recent Results (from the past 240 hour(s))  Respiratory Panel by RT PCR (Flu A&B, Covid) - Nasopharyngeal Swab     Status: None   Collection Time: 11/11/19  7:00 AM   Specimen: Nasopharyngeal Swab  Result Value Ref Range Status   SARS Coronavirus 2 by RT PCR NEGATIVE NEGATIVE Final    Comment:  (NOTE) SARS-CoV-2 target nucleic acids are NOT DETECTED. The SARS-CoV-2 RNA is generally detectable in upper respiratoy specimens during the acute phase of infection. The lowest concentration of SARS-CoV-2 viral copies this assay can detect is 131 copies/mL. A negative result does not preclude SARS-Cov-2 infection and should not be used as the sole basis for treatment or other patient management decisions. A negative result may occur with  improper specimen collection/handling, submission of specimen other than nasopharyngeal swab, presence of viral mutation(s) within the areas targeted by this assay, and inadequate number of viral copies (<131 copies/mL). A negative result must be combined with clinical observations, patient history, and epidemiological information. The expected result is Negative. Fact Sheet for Patients:  PinkCheek.be Fact Sheet for Healthcare Providers:  GravelBags.it This test is not yet ap proved or cleared by the [place] FDA and  has been authorized for detection and/or diagnosis of SARS-CoV-2 by FDA under an Emergency Use Authorization (EUA). This EUA will remain  in effect (meaning this test can be used) for the duration of the COVID-19 declaration under Section 564(b)(1) of the Act, 21 U.S.C. section 360bbb-3(b)(1), unless the authorization is terminated or revoked sooner.    Influenza A by PCR NEGATIVE NEGATIVE Final   Influenza B by PCR NEGATIVE NEGATIVE Final    Comment: (NOTE) The Xpert Xpress SARS-CoV-2/FLU/RSV assay is intended as an aid in  the diagnosis of influenza from Nasopharyngeal swab specimens and  should not be used as a sole basis for treatment. Nasal washings and  aspirates are unacceptable for Xpert Xpress SARS-CoV-2/FLU/RSV  testing. Fact Sheet for Patients: PinkCheek.be Fact Sheet for Healthcare  Providers: GravelBags.it This test is not yet approved or cleared by the [place] FDA and  has been authorized for detection and/or diagnosis of SARS-CoV-2 by  FDA under an Emergency Use Authorization (EUA). This EUA will remain  in effect (meaning this test can be used) for the duration of the  Covid-19 declaration under Section 564(b)(1) of the Act, 21  U.S.C. section 360bbb-3(b)(1), unless the authorization is  terminated or revoked. Performed at [facility], [address], [place], [place] [address]   MRSA PCR Screening     Status:  None   Collection Time: 11/11/19 11:31 AM   Specimen: Nasopharyngeal  Result Value Ref Range Status   MRSA by PCR NEGATIVE NEGATIVE Final    Comment:        The GeneXpert MRSA Assay (FDA approved for NASAL specimens only), is one component of a comprehensive MRSA colonization surveillance program. It is not intended to diagnose MRSA infection nor to guide or monitor treatment for MRSA infections. Performed at [facility], [address] [place], [place] [address]      Labs: BNP (last 3 results) No results for input(s): BNP in the last 8760 hours. Basic Metabolic Panel: Recent Labs  Lab 11/11/19 1325 11/11/19 1325 11/12/19 0600 11/14/19 0900 11/15/19 0957 11/16/19 0417 11/17/19 0538  NA 132*   < > 135 135 136 138 137  K 4.1   < > 4.3 4.4 3.9 3.5 3.2*  CL 98   < > 101 94* 97* 99 96*  CO2 24   < > 24 32 32 34* 33*  GLUCOSE 200*   < > 58* 105* 115* 83 70  BUN 27*   < > 31* 18 11 6  5*  CREATININE 6.89*   < > 7.71* 5.73* 3.98* 2.89* 3.10*  CALCIUM 6.8*   < > 7.2* 7.7* 7.9* 7.9* 8.0*  MG 2.0  --  2.0  --   --   --   --   PHOS  --   --  5.2* 4.5 3.2  --  2.1*   < > = values in this interval not displayed.   Liver Function Tests: Recent Labs  Lab 11/11/19 0612 11/11/19 1325 11/14/19 0900 11/15/19 0957 11/17/19 0538  AST 13* 15  --   --   --   ALT 9 9  --   --   --    ALKPHOS 49 41  --   --   --   BILITOT 0.8 1.1  --   --   --   PROT 4.4* 4.7*  --   --   --   ALBUMIN 2.2* 2.8* 2.4* 2.5* 2.5*   No results for input(s): LIPASE, AMYLASE in the last 168 hours. No results for input(s): AMMONIA in the last 168 hours. CBC: Recent Labs  Lab 11/13/19 2017 11/13/19 2017 11/14/19 0308 11/14/19 0502 11/14/19 0827 11/14/19 0827 11/14/19 1439 11/14/19 1439 11/15/19 0518 11/15/19 0957 11/16/19 0417 11/16/19 1841 11/17/19 0538  WBC 5.9   < > 5.8   < > 5.7   < > 5.8  --  5.9 5.2 5.3  --  5.7  NEUTROABS 3.3  --  3.2  --  3.3  --  3.6  --  3.6  --   --   --   --   HGB 7.5*   < > 7.4*   < > 7.3*   < > 7.5*   < > 7.1* 7.1* 6.9* 8.8* 9.3*  HCT 23.4*   < > 22.9*   < > 23.2*   < > 23.7*   < > 22.8* 23.1* 22.3* 28.0* 29.5*  MCV 84.2   < > 83.6   < > 84.7   < > 84.9  --  86.7 87.2 87.5  --  87.0  PLT 102*   < > 102*   < > 103*   < > 103*  --  108* 109* 132*  --  167   < > = values in this interval not displayed.   Cardiac Enzymes: No results  for input(s): CKTOTAL, CKMB, CKMBINDEX, TROPONINI in the last 168 hours. BNP: Invalid input(s): POCBNP CBG: Recent Labs  Lab 11/16/19 2113 11/17/19 0749 11/17/19 0811 11/17/19 0851 11/17/19 1159  GLUCAP 75 59* 59* 128* 80   D-Dimer No results for input(s): DDIMER in the last 72 hours. Hgb A1c Recent Labs    11/15/19 0957  HGBA1C 4.7*   Lipid Profile No results for input(s): CHOL, HDL, LDLCALC, TRIG, CHOLHDL, LDLDIRECT in the last 72 hours. Thyroid function studies No results for input(s): TSH, T4TOTAL, T3FREE, THYROIDAB in the last 72 hours.  Invalid input(s): FREET3 Anemia work up Recent Labs    11/16/19 0417  TIBC 99*  IRON 34   Urinalysis No results found for: COLORURINE, [place], LABSPEC, [place], GLUCOSEU, [place], [place], [place], PROTEINUR, UROBILINOGEN, NITRITE, LEUKOCYTESUR Sepsis Labs Invalid input(s): PROCALCITONIN,  WBC,  LACTICIDVEN Microbiology Recent Results (from the  past 240 hour(s))  Respiratory Panel by RT PCR (Flu A&B, Covid) - Nasopharyngeal Swab     Status: None   Collection Time: 11/11/19  7:00 AM   Specimen: Nasopharyngeal Swab  Result Value Ref Range Status   SARS Coronavirus 2 by RT PCR NEGATIVE NEGATIVE Final    Comment: (NOTE) SARS-CoV-2 target nucleic acids are NOT DETECTED. The SARS-CoV-2 RNA is generally detectable in upper respiratoy specimens during the acute phase of infection. The lowest concentration of SARS-CoV-2 viral copies this assay can detect is 131 copies/mL. A negative result does not preclude SARS-Cov-2 infection and should not be used as the sole basis for treatment or other patient management decisions. A negative result may occur with  improper specimen collection/handling, submission of specimen other than nasopharyngeal swab, presence of viral mutation(s) within the areas targeted by this assay, and inadequate number of viral copies (<131 copies/mL). A negative result must be combined with clinical observations, patient history, and epidemiological information. The expected result is Negative. Fact Sheet for Patients:  PinkCheek.be Fact Sheet for Healthcare Providers:  GravelBags.it This test is not yet ap proved or cleared by the [place] FDA and  has been authorized for detection and/or diagnosis of SARS-CoV-2 by FDA under an Emergency Use Authorization (EUA). This EUA will remain  in effect (meaning this test can be used) for the duration of the COVID-19 declaration under Section 564(b)(1) of the Act, 21 U.S.C. section 360bbb-3(b)(1), unless the authorization is terminated or revoked sooner.    Influenza A by PCR NEGATIVE NEGATIVE Final   Influenza B by PCR NEGATIVE NEGATIVE Final    Comment: (NOTE) The Xpert Xpress SARS-CoV-2/FLU/RSV assay is intended as an aid in  the diagnosis of influenza from Nasopharyngeal swab specimens and  should not be  used as a sole basis for treatment. Nasal washings and  aspirates are unacceptable for Xpert Xpress SARS-CoV-2/FLU/RSV  testing. Fact Sheet for Patients: PinkCheek.be Fact Sheet for Healthcare Providers: GravelBags.it This test is not yet approved or cleared by the [place] FDA and  has been authorized for detection and/or diagnosis of SARS-CoV-2 by  FDA under an Emergency Use Authorization (EUA). This EUA will remain  in effect (meaning this test can be used) for the duration of the  Covid-19 declaration under Section 564(b)(1) of the Act, 21  U.S.C. section 360bbb-3(b)(1), unless the authorization is  terminated or revoked. Performed at [facility], [address], [place], [place] [address]   MRSA PCR Screening     Status: None   Collection Time: 11/11/19 11:31 AM   Specimen: Nasopharyngeal  Result Value Ref Range  Status   MRSA by PCR NEGATIVE NEGATIVE Final    Comment:        The GeneXpert MRSA Assay (FDA approved for NASAL specimens only), is one component of a comprehensive MRSA colonization surveillance program. It is not intended to diagnose MRSA infection nor to guide or monitor treatment for MRSA infections. Performed at [facility], [address] [place], [place] [address]     Time coordinating discharge: Over 30 minutes  SIGNED:  Lorella Nimrod, MD  [facility] 11/17/2019, 1:13 PM  If 7PM-7AM, please contact night-coverage [email or web address]  This record has been created using Systems analyst. Errors have been sought and corrected,but may not always be located. Such creation errors do not reflect on the standard of care.

## 2019-11-17 NOTE — TOC Transition Note (Signed)
Transition of Care [facility]) - CM/SW Discharge Note   Patient Details  Name: [name] MRN: [id number] Date of Birth: 05/20/1961  Transition of Care [facility]) CM/SW Contact:  Candie Chroman, LCSW Phone Number: 11/17/2019, 1:11 PM   Clinical Narrative: Patient has orders to discharge home today. [facility] is aware. No further concerns. CSW signing off.    Final next level of care: [place] Barriers to Discharge: Barriers Resolved   Patient Goals and CMS Choice     Choice offered to / list presented to : Patient  Discharge Placement                    Patient and family notified of of transfer: 11/17/19  Discharge Plan and Services     Post Acute Care Choice: Home Health, Durable Medical Equipment          DME Arranged: Vac DME Agency: [facility] Date DME Agency Contacted: 11/17/19   Representative spoke with at DME Agency: [place]: RN, PT, OT, Social Work [employer] Agency: [place] Date [facility] Agency Contacted: 11/17/19   Representative spoke with at [place]: [place] Interventions     Readmission Risk Interventions No flowsheet data found.

## 2019-11-17 NOTE — Evaluation (Signed)
Physical Therapy Evaluation Patient Details Name: [name] MRN: [id number] DOB: 05/13/1961 Today's Date: 11/17/2019   History of Present Illness  59 year old female with end-stage renal disease on hemodialysis (M, W, F), peripheral vascular disease, hypertension, type 2 diabetes mellitus with peripheral neuropathy and obesity presented with uncontrollable bleeding from left upper extremity AV fistula. When EMS arrived they noticed copious amount of blood. She was found to be minimally responsive and in hemorrhagic shock. Her hemoglobin was 5.5. She received 2 unit PRBC and taken to the OR by vascular surgeon and underwent excision of the infected left arm brachial artery AV graft and placement of wound VAC. She was also found to be bradycardic in the 40s during the procedure and she was intubated and transferred to ICU.  Hemoglobin and heart rate stabilizes and she was transferred out of ICU on 11/13/2019.  Clinical Impression  Pt was willing to participate with PT but ultimately was extremely limited with mobility and unable to do any actual ambulation.  She managed a few side steps along EOB with a lot of extra time and moaning in pain.  She was apologetic that she could not do more, but again was highly limited.  She c/o pain in b/l UEs (R>L) as well as R groin all associated with vascular work/surgeries.  She needed excessive time just getting to standing all the while moaning in pain and needing assist.  Pt absolutely unsafe to go home despite desire to, PT recommending STR.     Follow Up Recommendations SNF    Equipment Recommendations  None recommended by PT    Recommendations for Other Services       Precautions / Restrictions Precautions Precautions: Fall Restrictions Weight Bearing Restrictions: No      Mobility  Bed Mobility Overal bed mobility: Needs Assistance Bed Mobility: Sit to Supine       Sit to supine: Max assist   General bed mobility comments: Pt calling  out in pain, unable to even attempt getting herself into bed  Transfers Overall transfer level: Needs assistance Equipment used: Rolling walker (2 wheeled) Transfers: Sit to/from Stand Sit to Stand: Mod assist         General transfer comment: Stood x2 during session, pt moaning and calling out in pain t/o the effort.  Very slow to rise, needed elevated bed height and direct assist to keep hips slowly shifting forward along with heavy cuing for UE use  Ambulation/Gait             General Gait Details: no true ambulation this session. 2 attempts: she took only one side on the first attempt, reported feeling dizzy and we sat back down (BP was fine).  On second attempt she took a 1/2 small step (over the course of about 2 minutes) attempting to "walk" and then for the sake of safety we aborted forward ambulation and she managed ~2 side steps along EOB.  All the while groaning in pain and apologizing that she could not do more.  Stairs            Wheelchair Mobility    Modified Rankin (Stroke Patients Only)       Balance Overall balance assessment: Needs assistance Sitting-balance support: Bilateral upper extremity supported;Feet supported Sitting balance-Leahy Scale: Fair Sitting balance - Comments: Pt leaning back and forth in stting, but able to maintain balance     Standing balance-Leahy Scale: Poor Standing balance comment: highly reliant on UEs (though in too much pain to  effectively use them) and needing constant encouragement just to remain upright                             Pertinent Vitals/Pain Pain Assessment: 0-10 Pain Score: 8  Pain Location: L arm, R groin,     Home Living Family/patient expects to be discharged to:: Skilled nursing facility Living Arrangements: Alone   Type of Home: Apartment Home Access: Level entry     Home Layout: One level Home Equipment: Walker - 2 wheels;Walker - 4 wheels;Cane - single point;Cane -  quad;Wheelchair - manual      Prior Function Level of Independence: Independent with assistive device(s)         Comments: Pt reports that son does her grocery shopping, checks in daily (either phone or in person) but that with a cane she is generally able to manage in the home     Hand Dominance        Extremity/Trunk Assessment   Upper Extremity Assessment Upper Extremity Assessment: Generalized weakness(very guarded/pain limited, no shld elev > 60 b/l, funct weak)    Lower Extremity Assessment Lower Extremity Assessment: RLE deficits/detail(apart from R hip grossly 4-/5 t/o b/l, pain hesistant t/o R) RLE Deficits / Details: pain limited with all R hip movements (temp cath removed earlier today) limited tolerance and AROM RLE: Unable to fully assess due to pain       Communication   Communication: No difficulties  Cognition Arousal/Alertness: Suspect due to medications;Lethargic Behavior During Therapy: [facility] for tasks assessed/performed;Anxious Overall Cognitive Status: Within Functional Limits for tasks assessed                                 General Comments: Pt sleepy but willing to participate with PT.  She was able answer most questions appropriate, though often needed extra encouragement/cuing      General Comments General comments (skin integrity, edema, etc.): Pt miserable t/o the session, motivated to try but simply unable to manage much in the way of function.  She very much wants to go home, but simply was unable to manage any real functional mobility    Exercises     Assessment/Plan    PT Assessment Patient needs continued PT services  PT Problem List Decreased strength;Decreased range of motion;Decreased activity tolerance;Decreased balance;Decreased mobility;Decreased coordination;Decreased knowledge of use of DME;Decreased safety awareness;Decreased knowledge of precautions;Pain       PT Treatment Interventions DME instruction;Gait  training;Stair training;Functional mobility training;Therapeutic activities;Therapeutic exercise;Balance training;Neuromuscular re-education;Patient/family education    PT Goals (Current goals can be found in the Care Plan section)  Acute Rehab PT Goals Patient Stated Goal: Pt wanting to go home but realizes that it would be unsafe PT Goal Formulation: With patient Time For Goal Achievement: 12/01/19 Potential to Achieve Goals: Fair    Frequency Min 2X/week   Barriers to discharge        Co-evaluation               AM-PAC PT "6 Clicks" Mobility  Outcome Measure Help needed turning from your back to your side while in a flat bed without using bedrails?: Total Help needed moving from lying on your back to sitting on the side of a flat bed without using bedrails?: Total Help needed moving to and from a bed to a chair (including a wheelchair)?: Total Help needed standing up from a  chair using your arms (e.g., wheelchair or bedside chair)?: A Lot Help needed to walk in hospital room?: Total Help needed climbing 3-5 steps with a railing? : Total 6 Click Score: 7    End of Session Equipment Utilized During Treatment: Gait belt Activity Tolerance: Patient limited by pain;Patient limited by fatigue Patient left: with bed alarm set;with call bell/phone within reach Nurse Communication: Mobility status PT Visit Diagnosis: Muscle weakness (generalized) (M62.81);Difficulty in walking, not elsewhere classified (R26.2);Pain;Unsteadiness on feet (R26.81) Pain - Right/Left: Left Pain - part of body: Arm    Time: 3794-4461 PT Time Calculation (min) (ACUTE ONLY): 48 min   Charges:   PT Evaluation $PT Eval Moderate Complexity: 1 Mod PT Treatments $Therapeutic Activity: 8-22 mins        Kreg Shropshire, DPT  11/17/2019, 3:09 PM

## 2019-11-17 NOTE — Progress Notes (Signed)
OT Cancellation Note  Patient Details Name: [name] MRN: [id number] DOB: September 15, 1961   Cancelled Treatment:    Reason Eval/Treat Not Completed: Patient declined, no reason specified Order received and chart reviewed.  Pt had R groin central line removed this morning and Permcath placed in right external jugular vein for ESRD.  Received clearance from RN for OOB activity but pt stating pain is 8/10 and NSG to inquire about further pain management to be able to participate in therapies and will update PT as well.  Will attempt again later today or tomorrow.  Chrys Racer, OTR/L, NTMTC ascom [phone] 11/17/19, 11:17 AM

## 2019-11-17 NOTE — Consult Note (Signed)
[facility] Nurse Consult Note: Reason for Consult:Left Brachial axillary AV graft site.  Infection and bleeding  Of AV graft, subsequent removal Wound type:infectious Pressure Injury POA: NA Measurement: 9 cm x 4 cm x 0.4 cm  3 sutures present to proximal end near axilla.  Minimal bleeding present and 125 cc bloody effluent in canister in 3 days.  Wound bed: Ruddy red Drainage (amount, consistency, odor) minimal serosanguinous bleeding with dressing change.   Periwound: Induration at AV graft site  No increased warmth or fluctuance Dressing procedure/placement/frequency: mepitel to wound bed. And 1 piece black foam.  Covered with drape. Change Tues/Thurs/Saturday.  [place] unit delivered and patient connected Will not follow at this time.  Please re-consult if needed.  Domenic Moras MSN, RN, FNP-BC CWON Wound, Ostomy, Continence Nurse Pager [phone]

## 2019-11-17 NOTE — Progress Notes (Signed)
PROGRESS NOTE    [name]  MLY:[id number] DOB: 1961-02-27 DOA: 11/11/2019 PCP: Freddy Finner, NP   Brief Narrative:  59 year old female with end-stage renal disease on hemodialysis (M, W, F), peripheral vascular disease, hypertension, type 2 diabetes mellitus with peripheral neuropathy and obesity presented with uncontrollable bleeding from left upper extremity AV fistula.  When EMS arrived they noticed copious amount of blood.  She was found to be minimally responsive and in hemorrhagic shock.  Her hemoglobin was 5.5.  She received 2 unit PRBC and taken to the OR by vascular surgeon and underwent excision of the infected left arm brachial artery AV graft and placement of wound VAC.  She was also found to be bradycardic in the 40s during the procedure and she was intubated and transferred to ICU. 2D echo done showed EF of 55-60% with grade 2 diastolic dysfunction.  Cardiology consult was called and suggested this was reflex bradycardia in the setting of hemorrhagic shock and no indication for a pacemaker. Heart rate has remained stable and patient transferred to hospital service on 2/21.  Subjective: Patient was very reluctant for discharge this morning.  Stating that she lives alone and cannot take care of herself.  Assessment & Plan:   Active Problems:   Hemorrhagic shock ([place])   ESRD on hemodialysis ([place])   Complication of arteriovenous dialysis fistula   PVD (peripheral vascular disease) ([facility])   Severe anemia   Pressure injury of skin   Shortness of breath  Ruptured and infected left AV graft with bleeding. S/p excision of left arm AV graft on 2/20 and wound VAC applied.  Nontunneled HD catheter in the right femoral vein initially placed followed by an EJ PermCath placed (due to stents in the left subclavian and innominate vein and right IJ being small and occluded). Vascular recommends she may need a femoral access in the future Wound care following. Surgical pathology with  inflammatory changes but no cultures report. -Continue empiric IV Ancef for infected AV graft for 1 more days. -Reevaluation by PT are recommending SNF placement.  Anemia.  Multifactorial with renal disease and blood loss. Hemoglobin improved to 9.3 this morning.  Patient received total of 4 units packed RBC and 1 units of FFP during current hospitalization. -Check iron studies-iron within normal limit. -Continue to monitor.  ESRD on hemodialysis (M, W, F) Received HD from right EJ permacath on 2/22. Going for dialysis today.  Shortness of breath.  Improved. Chest x-ray with mild pulmonary vascular congestion. She was saturating well on room air today. Going for dialysis.   Hypotension.  Resolved.  Blood pressure elevated.  Hypertension.  Continue home dose of Coreg and amlodipine as her blood pressure are elevated.  Chronic pancreatitis Continue Creon  Peripheral neuropathy Continue gabapentin  Thrombocytopenia Mild.  Monitor for now.  Objective: Vitals:   11/16/19 2233 11/17/19 0500 11/17/19 0545 11/17/19 1432  BP: (!) 167/54  (!) 162/56 (!) 163/56  Pulse: 66  61 (!) 56  Resp:   12 16  Temp:   97.9 F (36.6 C) 98.5 F (36.9 C)  TempSrc:   Oral Oral  SpO2: 95%  100% 100%  Weight:  87.4 kg    Height:        Intake/Output Summary (Last 24 hours) at 11/17/2019 1505 Last data filed at 11/16/2019 2233 Gross per 24 hour  Intake --  Output 0 ml  Net 0 ml   Filed Weights   11/16/19 0945 11/16/19 1320 11/17/19 0500  Weight: 92.2  kg 91.8 kg 87.4 kg    Examination:  General exam: Chronically ill-appearing lady, appears calm and comfortable  Respiratory system: Clear to auscultation. Respiratory effort normal. Cardiovascular system: S1 & S2 heard, RRR. No JVD, murmurs, rubs, gallops or clicks. Gastrointestinal system: Soft, nontender, nondistended, bowel sounds positive. Central nervous system: Alert and oriented. No focal neurological deficits.Symmetric 5 x 5  power. Extremities: Left upper extremity with clean bandage, extensive bruising and a wound VAC with bloody secretions, bilateral lower extremity with trace edema, dry scaly skin with chronic venous dermatitis, pulses intact and symmetrical. Psychiatry: Judgement and insight appear normal.   DVT prophylaxis: SCDs Code Status: Full Family Communication: Discussed with son on phone Disposition Plan: Medically stable.  Patient was not feeling safe going home with home health as lives alone and cannot take care of herself.  Reevaluation with PT are recommending SNF placement.  Social worker working on getting a bed.  Consultants:   Nephrology  Vascular surgery  Procedures: Excision of left arm AV fistula and wound VAC application,  right IJ permacath,  2D echo   Antimicrobials:  Cefazolin  Data Reviewed: I have personally reviewed following labs and imaging studies  CBC: Recent Labs  Lab 11/13/19 2017 11/13/19 2017 11/14/19 0308 11/14/19 0502 11/14/19 0827 11/14/19 0827 11/14/19 1439 11/14/19 1439 11/15/19 0518 11/15/19 0957 11/16/19 0417 11/16/19 1841 11/17/19 0538  WBC 5.9   < > 5.8   < > 5.7   < > 5.8  --  5.9 5.2 5.3  --  5.7  NEUTROABS 3.3  --  3.2  --  3.3  --  3.6  --  3.6  --   --   --   --   HGB 7.5*   < > 7.4*   < > 7.3*   < > 7.5*   < > 7.1* 7.1* 6.9* 8.8* 9.3*  HCT 23.4*   < > 22.9*   < > 23.2*   < > 23.7*   < > 22.8* 23.1* 22.3* 28.0* 29.5*  MCV 84.2   < > 83.6   < > 84.7   < > 84.9  --  86.7 87.2 87.5  --  87.0  PLT 102*   < > 102*   < > 103*   < > 103*  --  108* 109* 132*  --  167   < > = values in this interval not displayed.   Basic Metabolic Panel: Recent Labs  Lab 11/11/19 1325 11/11/19 1325 11/12/19 0600 11/14/19 0900 11/15/19 0957 11/16/19 0417 11/17/19 0538  NA 132*   < > 135 135 136 138 137  K 4.1   < > 4.3 4.4 3.9 3.5 3.2*  CL 98   < > 101 94* 97* 99 96*  CO2 24   < > 24 32 32 34* 33*  GLUCOSE 200*   < > 58* 105* 115* 83 70  BUN 27*    < > 31* 18 11 6  5*  CREATININE 6.89*   < > 7.71* 5.73* 3.98* 2.89* 3.10*  CALCIUM 6.8*   < > 7.2* 7.7* 7.9* 7.9* 8.0*  MG 2.0  --  2.0  --   --   --   --   PHOS  --   --  5.2* 4.5 3.2  --  2.1*   < > = values in this interval not displayed.   GFR: Estimated Creatinine Clearance: 21.1 mL/min (A) (by C-G formula based on SCr of 3.1 mg/dL (H)). Liver  Function Tests: Recent Labs  Lab 11/11/19 0612 11/11/19 1325 11/14/19 0900 11/15/19 0957 11/17/19 0538  AST 13* 15  --   --   --   ALT 9 9  --   --   --   ALKPHOS 49 41  --   --   --   BILITOT 0.8 1.1  --   --   --   PROT 4.4* 4.7*  --   --   --   ALBUMIN 2.2* 2.8* 2.4* 2.5* 2.5*   No results for input(s): LIPASE, AMYLASE in the last 168 hours. No results for input(s): AMMONIA in the last 168 hours. Coagulation Profile: Recent Labs  Lab 11/11/19 0612 11/11/19 1132 11/12/19 0600 11/14/19 0502  INR 1.4* 1.5* 1.4* 1.4*   Cardiac Enzymes: No results for input(s): CKTOTAL, CKMB, CKMBINDEX, TROPONINI in the last 168 hours. BNP (last 3 results) No results for input(s): PROBNP in the last 8760 hours. HbA1C: Recent Labs    11/15/19 0957  HGBA1C 4.7*   CBG: Recent Labs  Lab 11/16/19 2113 11/17/19 0749 11/17/19 0811 11/17/19 0851 11/17/19 1159  GLUCAP 75 59* 59* 128* 80   Lipid Profile: No results for input(s): CHOL, HDL, LDLCALC, TRIG, CHOLHDL, LDLDIRECT in the last 72 hours. Thyroid Function Tests: No results for input(s): TSH, T4TOTAL, FREET4, T3FREE, THYROIDAB in the last 72 hours. Anemia Panel: Recent Labs    11/16/19 0417  TIBC 99*  IRON 34   Sepsis Labs: No results for input(s): PROCALCITON, LATICACIDVEN in the last 168 hours.  Recent Results (from the past 240 hour(s))  Respiratory Panel by RT PCR (Flu A&B, Covid) - Nasopharyngeal Swab     Status: None   Collection Time: 11/11/19  7:00 AM   Specimen: Nasopharyngeal Swab  Result Value Ref Range Status   SARS Coronavirus 2 by RT PCR NEGATIVE NEGATIVE  Final    Comment: (NOTE) SARS-CoV-2 target nucleic acids are NOT DETECTED. The SARS-CoV-2 RNA is generally detectable in upper respiratoy specimens during the acute phase of infection. The lowest concentration of SARS-CoV-2 viral copies this assay can detect is 131 copies/mL. A negative result does not preclude SARS-Cov-2 infection and should not be used as the sole basis for treatment or other patient management decisions. A negative result may occur with  improper specimen collection/handling, submission of specimen other than nasopharyngeal swab, presence of viral mutation(s) within the areas targeted by this assay, and inadequate number of viral copies (<131 copies/mL). A negative result must be combined with clinical observations, patient history, and epidemiological information. The expected result is Negative. Fact Sheet for Patients:  PinkCheek.be Fact Sheet for Healthcare Providers:  GravelBags.it This test is not yet ap proved or cleared by the [place] FDA and  has been authorized for detection and/or diagnosis of SARS-CoV-2 by FDA under an Emergency Use Authorization (EUA). This EUA will remain  in effect (meaning this test can be used) for the duration of the COVID-19 declaration under Section 564(b)(1) of the Act, 21 U.S.C. section 360bbb-3(b)(1), unless the authorization is terminated or revoked sooner.    Influenza A by PCR NEGATIVE NEGATIVE Final   Influenza B by PCR NEGATIVE NEGATIVE Final    Comment: (NOTE) The Xpert Xpress SARS-CoV-2/FLU/RSV assay is intended as an aid in  the diagnosis of influenza from Nasopharyngeal swab specimens and  should not be used as a sole basis for treatment. Nasal washings and  aspirates are unacceptable for Xpert Xpress SARS-CoV-2/FLU/RSV  testing. Fact Sheet for Patients: PinkCheek.be Fact  Sheet for Healthcare  Providers: GravelBags.it This test is not yet approved or cleared by the [place] and  has been authorized for detection and/or diagnosis of SARS-CoV-2 by  FDA under an Emergency Use Authorization (EUA). This EUA will remain  in effect (meaning this test can be used) for the duration of the  Covid-19 declaration under Section 564(b)(1) of the Act, 21  U.S.C. section 360bbb-3(b)(1), unless the authorization is  terminated or revoked. Performed at [facility], [address] [place], [place] [address]   MRSA PCR Screening     Status: None   Collection Time: 11/11/19 11:31 AM   Specimen: Nasopharyngeal  Result Value Ref Range Status   MRSA by PCR NEGATIVE NEGATIVE Final    Comment:        The GeneXpert MRSA Assay (FDA approved for NASAL specimens only), is one component of a comprehensive MRSA colonization surveillance program. It is not intended to diagnose MRSA infection nor to guide or monitor treatment for MRSA infections. Performed at [facility], [address] [place], [place] [address]      Radiology Studies: No results found.  Scheduled Meds: . sodium chloride   Intravenous Once  . atorvastatin  20 mg Oral Daily  . citalopram  20 mg Oral Daily  . epoetin (EPOGEN/PROCRIT) injection  10,000 Units Intravenous Q M,W,F-HD  . fluticasone  2 spray Each Nare Daily  . gabapentin  200 mg Oral QHS  . influenza vac split quadrivalent PF  0.5 mL Intramuscular Tomorrow-1000  . insulin aspart  0-5 Units Subcutaneous QHS  . insulin aspart  0-9 Units Subcutaneous TID WC  . lipase/protease/amylase  36,000 Units Oral TID WC  . pantoprazole  40 mg Oral Daily  . pneumococcal 23 valent vaccine  0.5 mL Intramuscular Tomorrow-1000  . sevelamer carbonate  2,400 mg Oral TID WC  . sodium chloride flush  10-40 mL Intracatheter Q12H   Continuous Infusions: . sodium chloride 250 mL (11/16/19 1707)  .  ceFAZolin (ANCEF) IV 1 g  (11/16/19 1709)     LOS: 6 days   Time spent: 40 minutes  Lorella Nimrod, MD [facility]  If 7PM-7AM, please contact night-coverage [email or web address]  11/17/2019, 3:05 PM   This record has been created using Systems analyst. Errors have been sought and corrected,but may not always be located. Such creation errors do not reflect on the standard of care.

## 2019-11-17 NOTE — Progress Notes (Signed)
Patient will resume same schedule at [facility] MWF 11:00. Patient understands that [facility] does not have an available chair until April. Patient is agreeable to return to home clinic. Clinic is aware and ready for patient to return tomorrow.

## 2019-11-17 NOTE — Progress Notes (Signed)
[facility]  ROUNDING NOTE   Subjective:   Patient did not have herright femoral dialysis catheter removed yesterday. Patient with no other IV access.   Hemodialysis treatment yesterday. Tolerated treatment well. UF of 1 liter.  1 Unit PRBC transfusion yesterday.   Objective:  Vital signs in last 24 hours:  Temp:  [97.9 F (36.6 C)-98.9 F (37.2 C)] 97.9 F (36.6 C) (02/25 0545) Pulse Rate:  [61-88] 61 (02/25 0545) Resp:  [8-25] 12 (02/25 0545) BP: (105-173)/(40-82) 162/56 (02/25 0545) SpO2:  [95 %-100 %] 100 % (02/25 0545) Weight:  [87.4 kg-91.8 kg] 87.4 kg (02/25 0500)  Weight change: 3.4 kg Filed Weights   11/16/19 0945 11/16/19 1320 11/17/19 0500  Weight: 92.2 kg 91.8 kg 87.4 kg    Intake/Output: I/O last 3 completed shifts: In: 957.4 [P.O.:120; I.V.:57.4; Blood:730; IV Piggyback:50] Out: 1000 [Other:1000]   Intake/Output this shift:  No intake/output data recorded.  Physical Exam: General: NAD, laying in bed  Head: [place]/AT  Eyes: PERRL  Neck: Supple, trachea midline  Lungs:  Clear to auscultation     Heart: regular  Abdomen:  Soft, nontender, bowel sounds present  Extremities: left arm with clean dressings. + wound vac, + upper extremity edema   Neurologic: Alert and oriented  Skin: No lesions  Access: Left femoral temp HD catheter 2/20 Dr. Nickola Major permcath 2/22 Dr. Lucky Cowboy    Basic Metabolic Panel: Recent Labs  Lab 11/11/19 1325 11/11/19 1325 11/12/19 0600 11/12/19 0600 11/14/19 0900 11/14/19 0900 11/15/19 0957 11/16/19 0417 11/17/19 0538  NA 132*   < > 135  --  135  --  136 138 137  K 4.1   < > 4.3  --  4.4  --  3.9 3.5 3.2*  CL 98   < > 101  --  94*  --  97* 99 96*  CO2 24   < > 24  --  32  --  32 34* 33*  GLUCOSE 200*   < > 58*  --  105*  --  115* 83 70  BUN 27*   < > 31*  --  18  --  11 6 5*  CREATININE 6.89*   < > 7.71*  --  5.73*  --  3.98* 2.89* 3.10*  CALCIUM 6.8*   < > 7.2*   < > 7.7*   < > 7.9* 7.9* 8.0*  MG 2.0  --   2.0  --   --   --   --   --   --   PHOS  --   --  5.2*  --  4.5  --  3.2  --  2.1*   < > = values in this interval not displayed.    Liver Function Tests: Recent Labs  Lab 11/11/19 0612 11/11/19 1325 11/14/19 0900 11/15/19 0957 11/17/19 0538  AST 13* 15  --   --   --   ALT 9 9  --   --   --   ALKPHOS 49 41  --   --   --   BILITOT 0.8 1.1  --   --   --   PROT 4.4* 4.7*  --   --   --   ALBUMIN 2.2* 2.8* 2.4* 2.5* 2.5*   No results for input(s): LIPASE, AMYLASE in the last 168 hours. No results for input(s): AMMONIA in the last 168 hours.  CBC: Recent Labs  Lab 11/13/19 2017 11/13/19 2017 11/14/19 0308 11/14/19 0502 11/14/19 0827  11/14/19 0827 11/14/19 1439 11/14/19 1439 11/15/19 0518 11/15/19 0957 11/16/19 0417 11/16/19 1841 11/17/19 0538  WBC 5.9   < > 5.8   < > 5.7   < > 5.8  --  5.9 5.2 5.3  --  5.7  NEUTROABS 3.3  --  3.2  --  3.3  --  3.6  --  3.6  --   --   --   --   HGB 7.5*   < > 7.4*   < > 7.3*   < > 7.5*   < > 7.1* 7.1* 6.9* 8.8* 9.3*  HCT 23.4*   < > 22.9*   < > 23.2*   < > 23.7*   < > 22.8* 23.1* 22.3* 28.0* 29.5*  MCV 84.2   < > 83.6   < > 84.7   < > 84.9  --  86.7 87.2 87.5  --  87.0  PLT 102*   < > 102*   < > 103*   < > 103*  --  108* 109* 132*  --  167   < > = values in this interval not displayed.    Cardiac Enzymes: No results for input(s): CKTOTAL, CKMB, CKMBINDEX, TROPONINI in the last 168 hours.  BNP: Invalid input(s): POCBNP  CBG: Recent Labs  Lab 11/16/19 1645 11/16/19 2113 11/17/19 0749 11/17/19 0811 11/17/19 0851  GLUCAP 112* 75 59* 72* 128*    Microbiology: Results for orders placed or performed during the hospital encounter of 11/11/19  Respiratory Panel by RT PCR (Flu A&B, Covid) - Nasopharyngeal Swab     Status: None   Collection Time: 11/11/19  7:00 AM   Specimen: Nasopharyngeal Swab  Result Value Ref Range Status   SARS Coronavirus 2 by RT PCR NEGATIVE NEGATIVE Final    Comment: (NOTE) SARS-CoV-2 target nucleic  acids are NOT DETECTED. The SARS-CoV-2 RNA is generally detectable in upper respiratoy specimens during the acute phase of infection. The lowest concentration of SARS-CoV-2 viral copies this assay can detect is 131 copies/mL. A negative result does not preclude SARS-Cov-2 infection and should not be used as the sole basis for treatment or other patient management decisions. A negative result may occur with  improper specimen collection/handling, submission of specimen other than nasopharyngeal swab, presence of viral mutation(s) within the areas targeted by this assay, and inadequate number of viral copies (<131 copies/mL). A negative result must be combined with clinical observations, patient history, and epidemiological information. The expected result is Negative. Fact Sheet for Patients:  PinkCheek.be Fact Sheet for Healthcare Providers:  GravelBags.it This test is not yet ap proved or cleared by the [place] FDA and  has been authorized for detection and/or diagnosis of SARS-CoV-2 by FDA under an Emergency Use Authorization (EUA). This EUA will remain  in effect (meaning this test can be used) for the duration of the COVID-19 declaration under Section 564(b)(1) of the Act, 21 U.S.C. section 360bbb-3(b)(1), unless the authorization is terminated or revoked sooner.    Influenza A by PCR NEGATIVE NEGATIVE Final   Influenza B by PCR NEGATIVE NEGATIVE Final    Comment: (NOTE) The Xpert Xpress SARS-CoV-2/FLU/RSV assay is intended as an aid in  the diagnosis of influenza from Nasopharyngeal swab specimens and  should not be used as a sole basis for treatment. Nasal washings and  aspirates are unacceptable for Xpert Xpress SARS-CoV-2/FLU/RSV  testing. Fact Sheet for Patients: PinkCheek.be Fact Sheet for Healthcare Providers: GravelBags.it This test is not yet  approved or  cleared by the [place] and  has been authorized for detection and/or diagnosis of SARS-CoV-2 by  FDA under an Emergency Use Authorization (EUA). This EUA will remain  in effect (meaning this test can be used) for the duration of the  Covid-19 declaration under Section 564(b)(1) of the Act, 21  U.S.C. section 360bbb-3(b)(1), unless the authorization is  terminated or revoked. Performed at [facility], [address] [place], [place] [address]   MRSA PCR Screening     Status: None   Collection Time: 11/11/19 11:31 AM   Specimen: Nasopharyngeal  Result Value Ref Range Status   MRSA by PCR NEGATIVE NEGATIVE Final    Comment:        The GeneXpert MRSA Assay (FDA approved for NASAL specimens only), is one component of a comprehensive MRSA colonization surveillance program. It is not intended to diagnose MRSA infection nor to guide or monitor treatment for MRSA infections. Performed at [facility], [address] [place], [place] [address]     Coagulation Studies: No results for input(s): LABPROT, INR in the last 72 hours.  Urinalysis: No results for input(s): COLORURINE, LABSPEC, PHURINE, GLUCOSEU, HGBUR, BILIRUBINUR, KETONESUR, PROTEINUR, UROBILINOGEN, NITRITE, LEUKOCYTESUR in the last 72 hours.  Invalid input(s): APPERANCEUR    Imaging: No results found.   Medications:   . sodium chloride 250 mL (11/16/19 1707)  .  ceFAZolin (ANCEF) IV 1 g (11/16/19 1709)   . sodium chloride   Intravenous Once  . atorvastatin  20 mg Oral Daily  . Chlorhexidine Gluconate Cloth  6 each Topical Daily  . citalopram  20 mg Oral Daily  . epoetin (EPOGEN/PROCRIT) injection  10,000 Units Intravenous Q M,W,F-HD  . fluticasone  2 spray Each Nare Daily  . gabapentin  200 mg Oral QHS  . influenza vac split quadrivalent PF  0.5 mL Intramuscular Tomorrow-1000  . insulin aspart  0-5 Units Subcutaneous QHS  . insulin aspart  0-9 Units Subcutaneous TID  WC  . lipase/protease/amylase  36,000 Units Oral TID WC  . pantoprazole  40 mg Oral Daily  . pneumococcal 23 valent vaccine  0.5 mL Intramuscular Tomorrow-1000  . sevelamer carbonate  2,400 mg Oral TID WC  . sodium chloride flush  10-40 mL Intracatheter Q12H   sodium chloride, ipratropium-albuterol, oxyCODONE, phenol, promethazine, promethazine, sodium chloride flush  Assessment/ Plan:  Ms. AIREANNA LUELLEN is a 59 y.o. black female with end stage renal disease on hemodialysis for last fifteen years, pancreatic insufficiency, hypertension, peripheral vascular disease, GERD, diabetes mellitus type II,  with past medical history of ESRD on HD MWF, anemia of chronic kidney disease, COPD, hyperlipidemia who is admitted to [facility] on 11/11/2019 for Hemorrhagic shock ([place]) [R57.8] ESRD on hemodialysis ([place]) [U63.3, H54.5] Complication of arteriovenous dialysis fistula, initial encounter [T82.9XXA]  [facility] MWF [facility] 86.5kg  1.  ESRD on HD MWF:   With complication of dialysis device. Rupture of AVG (Hero). Status post ligation by Dr. Delana Meyer on 2/20. Suspected underlying graft infection, patient reports purulent drainage prior to admission. No cultures were sent - Continue MWF schedule - Use RIJ permcath - Remove right femoral temp catheter.  - Appreciate vascular input.  2.  Hypertension. 162/56 - not at goal.  - amlodipine and carvedilol.   3.  Anemia of chronic kidney disease with acute blood loss. Hemoglobin 9.3 Status post 2 units PRBC and 1 unit FFP on 2/19. 1 unit PRBC on 2/21. 1 unit PRBC 2/24 - EPO with HD treatment  4.  Secondary hyperparathyroidism:  - sevelamer    LOS: 6 Zarin Hagmann 2/25/20219:58 AM

## 2019-11-18 DIAGNOSIS — J9690 Respiratory failure, unspecified, unspecified whether with hypoxia or hypercapnia: Secondary | ICD-10-CM

## 2019-11-18 LAB — CBC
HCT: 29.4 % — ABNORMAL LOW (ref 36.0–46.0)
Hemoglobin: 9.5 g/dL — ABNORMAL LOW (ref 12.0–15.0)
MCH: 28.3 pg (ref 26.0–34.0)
MCHC: 32.3 g/dL (ref 30.0–36.0)
MCV: 87.5 fL (ref 80.0–100.0)
Platelets: 205 10*3/uL (ref 150–400)
RBC: 3.36 MIL/uL — ABNORMAL LOW (ref 3.87–5.11)
RDW: 16.7 % — ABNORMAL HIGH (ref 11.5–15.5)
WBC: 10.6 10*3/uL — ABNORMAL HIGH (ref 4.0–10.5)
nRBC: 0 % (ref 0.0–0.2)

## 2019-11-18 LAB — GLUCOSE, CAPILLARY
Glucose-Capillary: 68 mg/dL — ABNORMAL LOW (ref 70–99)
Glucose-Capillary: 77 mg/dL (ref 70–99)
Glucose-Capillary: 63 mg/dL — ABNORMAL LOW (ref 70–99)
Glucose-Capillary: 75 mg/dL (ref 70–99)
Glucose-Capillary: 120 mg/dL — ABNORMAL HIGH (ref 70–99)

## 2019-11-18 LAB — RENAL FUNCTION PANEL
Albumin: 2.6 g/dL — ABNORMAL LOW (ref 3.5–5.0)
Anion gap: 9 (ref 5–15)
BUN: 10 mg/dL (ref 6–20)
CO2: 27 mmol/L (ref 22–32)
Calcium: 7.9 mg/dL — ABNORMAL LOW (ref 8.9–10.3)
Chloride: 95 mmol/L — ABNORMAL LOW (ref 98–111)
Creatinine, Ser: 4.61 mg/dL — ABNORMAL HIGH (ref 0.44–1.00)
GFR calc Af Amer: 11 mL/min — ABNORMAL LOW (ref >60)
GFR calc non Af Amer: 10 mL/min — ABNORMAL LOW (ref >60)
Glucose, Bld: 94 mg/dL (ref 70–99)
Phosphorus: 2.2 mg/dL — ABNORMAL LOW (ref 2.5–4.6)
Potassium: 3.7 mmol/L (ref 3.5–5.1)
Sodium: 131 mmol/L — ABNORMAL LOW (ref 135–145)

## 2019-11-18 NOTE — TOC Transition Note (Signed)
Transition of Care [facility]) - CM/SW Discharge Note   Patient Details  Name: [name] MRN: [id number] Date of Birth: Nov 16, 1960  Transition of Care [facility]) CM/SW Contact:  Candie Chroman, LCSW Phone Number: 11/18/2019, 4:58 PM   Clinical Narrative: Patient has orders to discharge to [facility] today. Per SNF, insurance waiver still in effect until 2/28. CSW set up EMS transport. RN will call report to [phone] (Room 807). CSW called son to update him. RN updated patient. No further concerns. CSW signing off.    Final next level of care: [place] Barriers to Discharge: Barriers Resolved   Patient Goals and CMS Choice     Choice offered to / list presented to : Patient, Adult Children  Discharge Placement   Existing PASRR number confirmed : 11/17/19          Patient chooses bed at: [facility] [place] Patient to be transferred to facility by: EMS Name of family member notified: Maysie Parkhill Patient and family notified of of transfer: 11/18/19  Discharge Plan and Services     Post Acute Care Choice: Home Health, Durable Medical Equipment          DME Arranged: Vac DME Agency: [employer] Date DME Agency Contacted: 11/17/19   Representative spoke with at DME Agency: [place]: RN, PT, OT, Social Work [employer] Agency: [place] [facility] Agency Contacted: 11/17/19   Representative spoke with at [place]: [place] ([place]) Interventions     Readmission Risk Interventions No flowsheet data found.

## 2019-11-18 NOTE — Progress Notes (Signed)
Hypoglycemic Event  CBG: 68  Treatment: juice   Symptoms: Chills  Follow-up CBG: Time: 4445  CBG Result:77  Possible Reasons for Event: Minimal oral intake  Comments/MD notified:yes    Alveria Apley [place]

## 2019-11-18 NOTE — Progress Notes (Deleted)
OT Cancellation Note  Patient Details Name: [name] MRN: [id number] DOB: 04/27/1961   Cancelled Treatment:    Reason Eval/Treat Not Completed: Patient at procedure or test/ unavailable   Attempted to see patient for evaluation, however, patient at dialysis and out of room.  Will follow up as able.  Oren Binet 11/18/2019, 11:05 AM

## 2019-11-18 NOTE — Progress Notes (Signed)
[facility]  ROUNDING NOTE   Subjective:   Seen and examined on hemodialysis treatment. Tolerating treatment well. UF goal of 2.5 liters. Using right IJ permcath.   Temporary femoral catheter removed.    HEMODIALYSIS FLOWSHEET:  Blood Flow Rate (mL/min): 350 mL/min Arterial Pressure (mmHg): -140 mmHg Venous Pressure (mmHg): 100 mmHg Transmembrane Pressure (mmHg): 60 mmHg Ultrafiltration Rate (mL/min): 860 mL/min Dialysate Flow Rate (mL/min): 600 ml/min Conductivity: Machine : 13.8 Conductivity: Machine : 13.8 Dialysis Fluid Bolus: Normal Saline Bolus Amount (mL): 250 mL Dialysate Change: 2K    Objective:  Vital signs in last 24 hours:  Temp:  [98.2 F (36.8 C)-99.2 F (37.3 C)] 99.2 F (37.3 C) (02/26 0930) Pulse Rate:  [56-85] 73 (02/26 1100) Resp:  [12-25] 14 (02/26 1100) BP: (95-180)/(56-81) 171/72 (02/26 1100) SpO2:  [96 %-100 %] 100 % (02/26 1100) Weight:  [87.4 kg] 87.4 kg (02/26 0449)  Weight change: -4.8 kg Filed Weights   11/16/19 1320 11/17/19 0500 11/18/19 0449  Weight: 91.8 kg 87.4 kg 87.4 kg    Intake/Output: I/O last 3 completed shifts: In: 237.2 [I.V.:69.7; IV Piggyback:167.5] Out: 50 [Drains:50]   Intake/Output this shift:  No intake/output data recorded.  Physical Exam: General: NAD, laying in bed  Head: [place]/AT  Eyes: PERRL  Neck: Supple, trachea midline  Lungs:  Clear to auscultation     Heart: regular  Abdomen:  Soft, nontender, bowel sounds present  Extremities: left arm with clean dressings. + wound vac, + bilateral upper extremity edema   Neurologic: Alert and oriented  Skin: No lesions  Access: RIJ permcath 2/22 Dr. Lucky Cowboy    Basic Metabolic Panel: Recent Labs  Lab 11/11/19 1325 11/11/19 1325 11/12/19 0600 11/12/19 0600 11/14/19 0900 11/14/19 0900 11/15/19 0957 11/16/19 0417 11/17/19 0538  NA 132*   < > 135  --  135  --  136 138 137  K 4.1   < > 4.3  --  4.4  --  3.9 3.5 3.2*  CL 98   < > 101  --  94*  --   97* 99 96*  CO2 24   < > 24  --  32  --  32 34* 33*  GLUCOSE 200*   < > 58*  --  105*  --  115* 83 70  BUN 27*   < > 31*  --  18  --  11 6 5*  CREATININE 6.89*   < > 7.71*  --  5.73*  --  3.98* 2.89* 3.10*  CALCIUM 6.8*   < > 7.2*   < > 7.7*   < > 7.9* 7.9* 8.0*  MG 2.0  --  2.0  --   --   --   --   --   --   PHOS  --   --  5.2*  --  4.5  --  3.2  --  2.1*   < > = values in this interval not displayed.    Liver Function Tests: Recent Labs  Lab 11/11/19 1325 11/14/19 0900 11/15/19 0957 11/17/19 0538  AST 15  --   --   --   ALT 9  --   --   --   ALKPHOS 41  --   --   --   BILITOT 1.1  --   --   --   PROT 4.7*  --   --   --   ALBUMIN 2.8* 2.4* 2.5* 2.5*   No results for input(s): LIPASE,  AMYLASE in the last 168 hours. No results for input(s): AMMONIA in the last 168 hours.  CBC: Recent Labs  Lab 11/13/19 2017 11/13/19 2017 11/14/19 0308 11/14/19 0502 11/14/19 0827 11/14/19 0827 11/14/19 1439 11/14/19 1439 11/15/19 0518 11/15/19 0518 11/15/19 0957 11/16/19 0417 11/16/19 1841 11/17/19 0538 11/18/19 0930  WBC 5.9   < > 5.8   < > 5.7   < > 5.8   < > 5.9  --  5.2 5.3  --  5.7 10.6*  NEUTROABS 3.3  --  3.2  --  3.3  --  3.6  --  3.6  --   --   --   --   --   --   HGB 7.5*   < > 7.4*   < > 7.3*   < > 7.5*   < > 7.1*   < > 7.1* 6.9* 8.8* 9.3* 9.5*  HCT 23.4*   < > 22.9*   < > 23.2*   < > 23.7*   < > 22.8*   < > 23.1* 22.3* 28.0* 29.5* 29.4*  MCV 84.2   < > 83.6   < > 84.7   < > 84.9   < > 86.7  --  87.2 87.5  --  87.0 87.5  PLT 102*   < > 102*   < > 103*   < > 103*   < > 108*  --  109* 132*  --  167 205   < > = values in this interval not displayed.    Cardiac Enzymes: No results for input(s): CKTOTAL, CKMB, CKMBINDEX, TROPONINI in the last 168 hours.  BNP: Invalid input(s): POCBNP  CBG: Recent Labs  Lab 11/17/19 1159 11/17/19 1745 11/17/19 2101 11/18/19 0814 11/18/19 0844  GLUCAP 80 92 102* 68* 77    Microbiology: Results for orders placed or performed  during the hospital encounter of 11/11/19  Respiratory Panel by RT PCR (Flu A&B, Covid) - Nasopharyngeal Swab     Status: None   Collection Time: 11/11/19  7:00 AM   Specimen: Nasopharyngeal Swab  Result Value Ref Range Status   SARS Coronavirus 2 by RT PCR NEGATIVE NEGATIVE Final    Comment: (NOTE) SARS-CoV-2 target nucleic acids are NOT DETECTED. The SARS-CoV-2 RNA is generally detectable in upper respiratoy specimens during the acute phase of infection. The lowest concentration of SARS-CoV-2 viral copies this assay can detect is 131 copies/mL. A negative result does not preclude SARS-Cov-2 infection and should not be used as the sole basis for treatment or other patient management decisions. A negative result may occur with  improper specimen collection/handling, submission of specimen other than nasopharyngeal swab, presence of viral mutation(s) within the areas targeted by this assay, and inadequate number of viral copies (<131 copies/mL). A negative result must be combined with clinical observations, patient history, and epidemiological information. The expected result is Negative. Fact Sheet for Patients:  PinkCheek.be Fact Sheet for Healthcare Providers:  GravelBags.it This test is not yet ap proved or cleared by the [place] FDA and  has been authorized for detection and/or diagnosis of SARS-CoV-2 by FDA under an Emergency Use Authorization (EUA). This EUA will remain  in effect (meaning this test can be used) for the duration of the COVID-19 declaration under Section 564(b)(1) of the Act, 21 U.S.C. section 360bbb-3(b)(1), unless the authorization is terminated or revoked sooner.    Influenza A by PCR NEGATIVE NEGATIVE Final   Influenza B by PCR NEGATIVE NEGATIVE Final  Comment: (NOTE) The Xpert Xpress SARS-CoV-2/FLU/RSV assay is intended as an aid in  the diagnosis of influenza from Nasopharyngeal swab  specimens and  should not be used as a sole basis for treatment. Nasal washings and  aspirates are unacceptable for Xpert Xpress SARS-CoV-2/FLU/RSV  testing. Fact Sheet for Patients: PinkCheek.be Fact Sheet for Healthcare Providers: GravelBags.it This test is not yet approved or cleared by the [place] FDA and  has been authorized for detection and/or diagnosis of SARS-CoV-2 by  FDA under an Emergency Use Authorization (EUA). This EUA will remain  in effect (meaning this test can be used) for the duration of the  Covid-19 declaration under Section 564(b)(1) of the Act, 21  U.S.C. section 360bbb-3(b)(1), unless the authorization is  terminated or revoked. Performed at [facility], [address] [place], [place] [address]   MRSA PCR Screening     Status: None   Collection Time: 11/11/19 11:31 AM   Specimen: Nasopharyngeal  Result Value Ref Range Status   MRSA by PCR NEGATIVE NEGATIVE Final    Comment:        The GeneXpert MRSA Assay (FDA approved for NASAL specimens only), is one component of a comprehensive MRSA colonization surveillance program. It is not intended to diagnose MRSA infection nor to guide or monitor treatment for MRSA infections. Performed at [facility], [place], [place] [address]   SARS CORONAVIRUS 2 (TAT 6-24 HRS) Nasopharyngeal Nasopharyngeal Swab     Status: None   Collection Time: 11/17/19  6:22 PM   Specimen: Nasopharyngeal Swab  Result Value Ref Range Status   SARS Coronavirus 2 NEGATIVE NEGATIVE Final    Comment: (NOTE) SARS-CoV-2 target nucleic acids are NOT DETECTED. The SARS-CoV-2 RNA is generally detectable in upper and lower respiratory specimens during the acute phase of infection. Negative results do not preclude SARS-CoV-2 infection, do not rule out co-infections with other pathogens, and should not be used as the sole basis for treatment  or other patient management decisions. Negative results must be combined with clinical observations, patient history, and epidemiological information. The expected result is Negative. Fact Sheet for Patients: SugarRoll.be Fact Sheet for Healthcare Providers: https://www.woods-mathews.com/ This test is not yet approved or cleared by the [place] FDA and  has been authorized for detection and/or diagnosis of SARS-CoV-2 by FDA under an Emergency Use Authorization (EUA). This EUA will remain  in effect (meaning this test can be used) for the duration of the COVID-19 declaration under Section 56 4(b)(1) of the Act, 21 U.S.C. section 360bbb-3(b)(1), unless the authorization is terminated or revoked sooner. Performed at [facility], [place] [address], [place], [place] [address]     Coagulation Studies: No results for input(s): LABPROT, INR in the last 72 hours.  Urinalysis: No results for input(s): COLORURINE, LABSPEC, PHURINE, GLUCOSEU, HGBUR, BILIRUBINUR, KETONESUR, PROTEINUR, UROBILINOGEN, NITRITE, LEUKOCYTESUR in the last 72 hours.  Invalid input(s): APPERANCEUR    Imaging: No results found.   Medications:   . sodium chloride Stopped (11/17/19 1945)  .  ceFAZolin (ANCEF) IV Stopped (11/17/19 1909)   . sodium chloride   Intravenous Once  . atorvastatin  20 mg Oral Daily  . Chlorhexidine Gluconate Cloth  6 each Topical Daily  . citalopram  20 mg Oral Daily  . epoetin (EPOGEN/PROCRIT) injection  10,000 Units Intravenous Q M,W,F-HD  . fluticasone  2 spray Each Nare Daily  . gabapentin  200 mg Oral QHS  . influenza vac split quadrivalent PF  0.5 mL Intramuscular Tomorrow-1000  . insulin aspart  0-5 Units Subcutaneous QHS  . insulin aspart  0-9 Units Subcutaneous TID WC  . lipase/protease/amylase  36,000 Units Oral TID WC  . pantoprazole  40 mg Oral Daily  . pneumococcal 23 valent vaccine  0.5 mL Intramuscular Tomorrow-1000  .  sevelamer carbonate  2,400 mg Oral TID WC  . sodium chloride flush  10-40 mL Intracatheter Q12H   sodium chloride, ipratropium-albuterol, oxyCODONE, phenol, promethazine, promethazine, sodium chloride flush  Assessment/ Plan:  [name] is a 59 y.o. black female with end stage renal disease on hemodialysis for last fifteen years, pancreatic insufficiency, hypertension, peripheral vascular disease, GERD, diabetes mellitus type II,  with past medical history of ESRD on HD MWF, anemia of chronic kidney disease, COPD, hyperlipidemia who is admitted to [facility] on 11/11/2019 for Hemorrhagic shock ([place]) [R57.8] ESRD on hemodialysis ([place]) [F68.1, E75.1] Complication of arteriovenous dialysis fistula, initial encounter [T82.9XXA]  [facility] MWF [facility] 86.5kg  1.  ESRD on HD MWF:   With complication of dialysis device. Rupture of AVG (Hero). Status post ligation by Dr. Delana Meyer on 2/20. Suspected underlying graft infection, patient reports purulent drainage prior to admission. No cultures were sent - Continue MWF schedule - Using RIJ permcath - Appreciate vascular input.  2.  Hypertension. 171/72 - not at goal.  - amlodipine and carvedilol.   3.  Anemia of chronic kidney disease with acute blood loss. Hemoglobin 9.5 Status post 2 units PRBC and 1 unit FFP on 2/19. 1 unit PRBC on 2/21. 2 unit PRBC 2/24 - EPO with HD treatment  4.  Secondary hyperparathyroidism:  - sevelamer    LOS: 7 [name] 2/26/202111:10 AM

## 2019-11-18 NOTE — Progress Notes (Signed)
[name] MRN: [id number] DOB: 06-19-1961   Cancelled Treatment:    Reason Eval/Treat Not Completed: Patient at procedure or test/unavailable Pt out of room all morning for dialysis, will attempt later as time allows and pt is appropriate.  Kreg Shropshire, DPT 11/18/2019, 12:12 PM

## 2019-11-18 NOTE — Progress Notes (Signed)
OT Cancellation Note  Patient Details Name: [name] MRN: [id number] DOB: 1961/01/13   Cancelled Treatment:    Reason Eval/Treat Not Completed: Other (comment)   Approached pt this afternoon for therapy session.  Patient refused stating, "I'm looking to get out of here". Will follow up as appropriate and able.  Oren Binet 11/18/2019, 3:28 PM

## 2019-11-18 NOTE — Plan of Care (Signed)
The patient has been transfer to [facility] via EMS. The patient's son has been notified of the patient being transfer to SNF. IV removed. Wound vac has been taken down and wet to dry dressing has been placed. New wound vac will be place at [facility].  Problem: Education: Goal: Knowledge of General Education information will improve Description: Including pain rating scale, medication(s)/side effects and non-pharmacologic comfort measures Outcome: Completed/Met   Problem: Health Behavior/Discharge Planning: Goal: Ability to manage health-related needs will improve Outcome: Completed/Met   Problem: Clinical Measurements: Goal: Ability to maintain clinical measurements within normal limits will improve Outcome: Completed/Met Goal: Will remain free from infection Outcome: Completed/Met Goal: Diagnostic test results will improve Outcome: Completed/Met Goal: Respiratory complications will improve Outcome: Completed/Met Goal: Cardiovascular complication will be avoided Outcome: Completed/Met   Problem: Activity: Goal: Risk for activity intolerance will decrease Outcome: Completed/Met   Problem: Nutrition: Goal: Adequate nutrition will be maintained Outcome: Completed/Met   Problem: Coping: Goal: Level of anxiety will decrease Outcome: Completed/Met   Problem: Elimination: Goal: Will not experience complications related to bowel motility Outcome: Completed/Met Goal: Will not experience complications related to urinary retention Outcome: Completed/Met   Problem: Safety: Goal: Ability to remain free from injury will improve Outcome: Completed/Met   Problem: Skin Integrity: Goal: Risk for impaired skin integrity will decrease Outcome: Completed/Met   Problem: Acute Rehab PT Goals(only PT should resolve) Goal: Pt Will Go Supine/Side To Sit Outcome: Completed/Met Goal: Pt Will Go Sit To Supine/Side Outcome: Completed/Met Goal: Patient Will Transfer Sit To/From  Stand Outcome: Completed/Met Goal: Pt Will Ambulate Outcome: Completed/Met

## 2019-11-18 NOTE — TOC Progression Note (Addendum)
Transition of Care [facility]) - Progression Note    Patient Details  Name: [name] MRN: [id number] Date of Birth: Sep 24, 1960  [facility] CM/SW Contact  Candie Chroman, LCSW Phone Number: 11/18/2019, 8:26 AM  Clinical Narrative: [facility] can offer patient a bed. They started ordering wound vac yesterday afternoon. COVID test done yesterday and results are negative. Faxed clinicals to [facility] for insurance authorization review.    9:31 am: Updated patient and her son. They are agreeable.  2:26 pm: [facility] has not received clinicals that have been faxed over twice. CSW attempted routing through Epic.  3:11 pm: Confirmed [facility] received clinicals that were faxed over. Wound vac is ready at the SNF.  4:14 pm: Case manager at [facility] just got assigned to case. She said she will hopefully have information reviewed within the next hour.  Expected Discharge Plan: [place] Barriers to Discharge: Barriers Resolved  Expected Discharge Plan and Services Expected Discharge Plan: [place], Durable Medical Equipment Living arrangements for the past 2 months: Apartment Expected Discharge Date: 11/17/19               DME Arranged: Harlin Heys DME Agency: [employer] Date DME Agency Contacted: 11/17/19   Representative spoke with at DME Agency: [facility] HH Arranged: RN, PT, OT, Social Work [employer] Agency: [place] Date [place]: 11/17/19   Representative spoke with at [place]: [place] ([place]) Interventions    Readmission Risk Interventions No flowsheet data found.

## 2019-11-18 NOTE — Progress Notes (Signed)
OT Cancellation Note  Patient Details Name: [name] MRN: [id number] DOB: 1960/12/12   Cancelled Treatment:    Reason Eval/Treat Not Completed: Patient at procedure or test/ unavailable   Therapist attempted to see pt for evaluation this date.  Patient unavailable due to dialysis.  Will follow up as able.  Oren Binet 11/18/2019, 11:03 AM

## 2019-11-21 ENCOUNTER — Other Ambulatory Visit
Admission: RE | Admit: 2019-11-21 | Discharge: 2019-11-21 | Disposition: A | Discharge status: Home / Self Care | Payer: Medicare Other | Source: Ambulatory Visit | Attending: Nephrology | Admitting: Nephrology

## 2019-11-21 ENCOUNTER — Encounter: Payer: Self-pay | Admitting: *Deleted

## 2019-11-21 DIAGNOSIS — S40022A Contusion of left upper arm, initial encounter: Secondary | ICD-10-CM | POA: Diagnosis not present

## 2019-11-21 DIAGNOSIS — M79602 Pain in left arm: Secondary | ICD-10-CM | POA: Diagnosis not present

## 2019-11-21 DIAGNOSIS — N186 End stage renal disease: Secondary | ICD-10-CM | POA: Insufficient documentation

## 2019-11-21 LAB — CBC WITH DIFFERENTIAL/PLATELET
Abs Immature Granulocytes: 0.02 10*3/uL (ref 0.00–0.07)
Basophils Absolute: 0.1 10*3/uL (ref 0.0–0.1)
Basophils Relative: 1 %
Eosinophils Absolute: 1.1 10*3/uL — ABNORMAL HIGH (ref 0.0–0.5)
Eosinophils Relative: 15 %
HCT: 32.4 % — ABNORMAL LOW (ref 36.0–46.0)
Hemoglobin: 10.4 g/dL — ABNORMAL LOW (ref 12.0–15.0)
Immature Granulocytes: 0 %
Lymphocytes Relative: 20 %
Lymphs Abs: 1.4 10*3/uL (ref 0.7–4.0)
MCH: 27.4 pg (ref 26.0–34.0)
MCHC: 32.1 g/dL (ref 30.0–36.0)
MCV: 85.3 fL (ref 80.0–100.0)
Monocytes Absolute: 0.5 10*3/uL (ref 0.1–1.0)
Monocytes Relative: 8 %
Neutro Abs: 3.9 10*3/uL (ref 1.7–7.7)
Neutrophils Relative %: 56 %
Platelets: 247 10*3/uL (ref 150–400)
RBC: 3.8 MIL/uL — ABNORMAL LOW (ref 3.87–5.11)
RDW: 17.2 % — ABNORMAL HIGH (ref 11.5–15.5)
WBC: 7 10*3/uL (ref 4.0–10.5)
nRBC: 0 % (ref 0.0–0.2)

## 2019-11-21 LAB — COMPREHENSIVE METABOLIC PANEL
ALT: <5 U/L (ref 0–44)
AST: 19 U/L (ref 15–41)
Albumin: 2.7 g/dL — ABNORMAL LOW (ref 3.5–5.0)
Alkaline Phosphatase: 88 U/L (ref 38–126)
Anion gap: 10 (ref 5–15)
BUN: 15 mg/dL (ref 6–20)
CO2: 33 mmol/L — ABNORMAL HIGH (ref 22–32)
Calcium: 8.3 mg/dL — ABNORMAL LOW (ref 8.9–10.3)
Chloride: 90 mmol/L — ABNORMAL LOW (ref 98–111)
Creatinine, Ser: 5.9 mg/dL — ABNORMAL HIGH (ref 0.44–1.00)
GFR calc Af Amer: 8 mL/min — ABNORMAL LOW (ref >60)
GFR calc non Af Amer: 7 mL/min — ABNORMAL LOW (ref >60)
Glucose, Bld: 89 mg/dL (ref 70–99)
Potassium: 3.6 mmol/L (ref 3.5–5.1)
Sodium: 133 mmol/L — ABNORMAL LOW (ref 135–145)
Total Bilirubin: 0.7 mg/dL (ref 0.3–1.2)
Total Protein: 6.3 g/dL — ABNORMAL LOW (ref 6.5–8.1)

## 2019-11-24 ENCOUNTER — Other Ambulatory Visit: Payer: Self-pay

## 2019-11-24 ENCOUNTER — Inpatient Hospital Stay
Admission: EM | Admit: 2019-11-24 | Discharge: 2019-11-30 | DRG: 604 | Disposition: A | Discharge status: Skilled Nursing Facility | Payer: Medicare Other | Attending: Internal Medicine | Admitting: Internal Medicine

## 2019-11-24 ENCOUNTER — Emergency Department: Payer: Medicare Other

## 2019-11-24 ENCOUNTER — Encounter: Payer: Self-pay | Admitting: Internal Medicine

## 2019-11-24 DIAGNOSIS — Z9109 Other allergy status, other than to drugs and biological substances: Secondary | ICD-10-CM

## 2019-11-24 DIAGNOSIS — F419 Anxiety disorder, unspecified: Secondary | ICD-10-CM | POA: Diagnosis present

## 2019-11-24 DIAGNOSIS — I252 Old myocardial infarction: Secondary | ICD-10-CM

## 2019-11-24 DIAGNOSIS — M1990 Unspecified osteoarthritis, unspecified site: Secondary | ICD-10-CM | POA: Diagnosis present

## 2019-11-24 DIAGNOSIS — N2581 Secondary hyperparathyroidism of renal origin: Secondary | ICD-10-CM | POA: Diagnosis present

## 2019-11-24 DIAGNOSIS — Z6834 Body mass index (BMI) 34.0-34.9, adult: Secondary | ICD-10-CM | POA: Diagnosis not present

## 2019-11-24 DIAGNOSIS — I340 Nonrheumatic mitral (valve) insufficiency: Secondary | ICD-10-CM | POA: Diagnosis present

## 2019-11-24 DIAGNOSIS — L7632 Postprocedural hematoma of skin and subcutaneous tissue following other procedure: Secondary | ICD-10-CM | POA: Diagnosis not present

## 2019-11-24 DIAGNOSIS — M79602 Pain in left arm: Secondary | ICD-10-CM | POA: Diagnosis present

## 2019-11-24 DIAGNOSIS — K8689 Other specified diseases of pancreas: Secondary | ICD-10-CM | POA: Diagnosis present

## 2019-11-24 DIAGNOSIS — R609 Edema, unspecified: Secondary | ICD-10-CM | POA: Diagnosis not present

## 2019-11-24 DIAGNOSIS — Z885 Allergy status to narcotic agent status: Secondary | ICD-10-CM

## 2019-11-24 DIAGNOSIS — E1143 Type 2 diabetes mellitus with diabetic autonomic (poly)neuropathy: Secondary | ICD-10-CM | POA: Diagnosis present

## 2019-11-24 DIAGNOSIS — E1122 Type 2 diabetes mellitus with diabetic chronic kidney disease: Secondary | ICD-10-CM | POA: Diagnosis present

## 2019-11-24 DIAGNOSIS — E1142 Type 2 diabetes mellitus with diabetic polyneuropathy: Secondary | ICD-10-CM | POA: Diagnosis present

## 2019-11-24 DIAGNOSIS — J449 Chronic obstructive pulmonary disease, unspecified: Secondary | ICD-10-CM | POA: Diagnosis present

## 2019-11-24 DIAGNOSIS — F329 Major depressive disorder, single episode, unspecified: Secondary | ICD-10-CM | POA: Diagnosis present

## 2019-11-24 DIAGNOSIS — D631 Anemia in chronic kidney disease: Secondary | ICD-10-CM | POA: Diagnosis present

## 2019-11-24 DIAGNOSIS — Z888 Allergy status to other drugs, medicaments and biological substances status: Secondary | ICD-10-CM

## 2019-11-24 DIAGNOSIS — Y838 Other surgical procedures as the cause of abnormal reaction of the patient, or of later complication, without mention of misadventure at the time of the procedure: Secondary | ICD-10-CM | POA: Diagnosis present

## 2019-11-24 DIAGNOSIS — N186 End stage renal disease: Secondary | ICD-10-CM

## 2019-11-24 DIAGNOSIS — K3184 Gastroparesis: Secondary | ICD-10-CM | POA: Diagnosis present

## 2019-11-24 DIAGNOSIS — K219 Gastro-esophageal reflux disease without esophagitis: Secondary | ICD-10-CM | POA: Diagnosis present

## 2019-11-24 DIAGNOSIS — I132 Hypertensive heart and chronic kidney disease with heart failure and with stage 5 chronic kidney disease, or end stage renal disease: Secondary | ICD-10-CM | POA: Diagnosis present

## 2019-11-24 DIAGNOSIS — Z20822 Contact with and (suspected) exposure to covid-19: Secondary | ICD-10-CM | POA: Diagnosis present

## 2019-11-24 DIAGNOSIS — H9190 Unspecified hearing loss, unspecified ear: Secondary | ICD-10-CM | POA: Diagnosis present

## 2019-11-24 DIAGNOSIS — Z992 Dependence on renal dialysis: Secondary | ICD-10-CM | POA: Diagnosis not present

## 2019-11-24 DIAGNOSIS — Z85528 Personal history of other malignant neoplasm of kidney: Secondary | ICD-10-CM

## 2019-11-24 DIAGNOSIS — Z841 Family history of disorders of kidney and ureter: Secondary | ICD-10-CM

## 2019-11-24 DIAGNOSIS — I2510 Atherosclerotic heart disease of native coronary artery without angina pectoris: Secondary | ICD-10-CM | POA: Diagnosis present

## 2019-11-24 DIAGNOSIS — Z7902 Long term (current) use of antithrombotics/antiplatelets: Secondary | ICD-10-CM

## 2019-11-24 DIAGNOSIS — Z79899 Other long term (current) drug therapy: Secondary | ICD-10-CM

## 2019-11-24 DIAGNOSIS — E1151 Type 2 diabetes mellitus with diabetic peripheral angiopathy without gangrene: Secondary | ICD-10-CM | POA: Diagnosis present

## 2019-11-24 DIAGNOSIS — M7989 Other specified soft tissue disorders: Secondary | ICD-10-CM | POA: Diagnosis not present

## 2019-11-24 DIAGNOSIS — S40022D Contusion of left upper arm, subsequent encounter: Secondary | ICD-10-CM | POA: Diagnosis not present

## 2019-11-24 DIAGNOSIS — E669 Obesity, unspecified: Secondary | ICD-10-CM | POA: Diagnosis present

## 2019-11-24 DIAGNOSIS — M79606 Pain in leg, unspecified: Secondary | ICD-10-CM | POA: Diagnosis not present

## 2019-11-24 DIAGNOSIS — Z7982 Long term (current) use of aspirin: Secondary | ICD-10-CM

## 2019-11-24 DIAGNOSIS — Z833 Family history of diabetes mellitus: Secondary | ICD-10-CM

## 2019-11-24 DIAGNOSIS — I5032 Chronic diastolic (congestive) heart failure: Secondary | ICD-10-CM | POA: Diagnosis present

## 2019-11-24 DIAGNOSIS — Z794 Long term (current) use of insulin: Secondary | ICD-10-CM

## 2019-11-24 DIAGNOSIS — M60022 Infective myositis, left upper arm: Secondary | ICD-10-CM

## 2019-11-24 DIAGNOSIS — I97638 Postprocedural hematoma of a circulatory system organ or structure following other circulatory system procedure: Secondary | ICD-10-CM | POA: Diagnosis not present

## 2019-11-24 DIAGNOSIS — S40022A Contusion of left upper arm, initial encounter: Secondary | ICD-10-CM | POA: Diagnosis present

## 2019-11-24 LAB — CBC WITH DIFFERENTIAL/PLATELET
Abs Immature Granulocytes: 0.02 10*3/uL (ref 0.00–0.07)
Basophils Absolute: 0.1 10*3/uL (ref 0.0–0.1)
Basophils Relative: 1 %
Eosinophils Absolute: 1.3 10*3/uL — ABNORMAL HIGH (ref 0.0–0.5)
Eosinophils Relative: 16 %
HCT: 32.1 % — ABNORMAL LOW (ref 36.0–46.0)
Hemoglobin: 10.2 g/dL — ABNORMAL LOW (ref 12.0–15.0)
Immature Granulocytes: 0 %
Lymphocytes Relative: 30 %
Lymphs Abs: 2.4 10*3/uL (ref 0.7–4.0)
MCH: 27.8 pg (ref 26.0–34.0)
MCHC: 31.8 g/dL (ref 30.0–36.0)
MCV: 87.5 fL (ref 80.0–100.0)
Monocytes Absolute: 0.6 10*3/uL (ref 0.1–1.0)
Monocytes Relative: 7 %
Neutro Abs: 3.6 10*3/uL (ref 1.7–7.7)
Neutrophils Relative %: 46 %
Platelets: 209 10*3/uL (ref 150–400)
RBC: 3.67 MIL/uL — ABNORMAL LOW (ref 3.87–5.11)
RDW: 17.6 % — ABNORMAL HIGH (ref 11.5–15.5)
WBC: 7.9 10*3/uL (ref 4.0–10.5)
nRBC: 0 % (ref 0.0–0.2)

## 2019-11-24 LAB — PROTIME-INR
INR: 1.2 (ref 0.8–1.2)
Prothrombin Time: 14.9 seconds (ref 11.4–15.2)

## 2019-11-24 LAB — COMPREHENSIVE METABOLIC PANEL
ALT: <5 U/L (ref 0–44)
AST: 22 U/L (ref 15–41)
Albumin: 2.8 g/dL — ABNORMAL LOW (ref 3.5–5.0)
Alkaline Phosphatase: 91 U/L (ref 38–126)
Anion gap: 7 (ref 5–15)
BUN: 5 mg/dL — ABNORMAL LOW (ref 6–20)
CO2: 29 mmol/L (ref 22–32)
Calcium: 8.1 mg/dL — ABNORMAL LOW (ref 8.9–10.3)
Chloride: 95 mmol/L — ABNORMAL LOW (ref 98–111)
Creatinine, Ser: 3.49 mg/dL — ABNORMAL HIGH (ref 0.44–1.00)
GFR calc Af Amer: 16 mL/min — ABNORMAL LOW (ref >60)
GFR calc non Af Amer: 14 mL/min — ABNORMAL LOW (ref >60)
Glucose, Bld: 106 mg/dL — ABNORMAL HIGH (ref 70–99)
Potassium: 3.3 mmol/L — ABNORMAL LOW (ref 3.5–5.1)
Sodium: 131 mmol/L — ABNORMAL LOW (ref 135–145)
Total Bilirubin: 0.5 mg/dL (ref 0.3–1.2)
Total Protein: 6.2 g/dL — ABNORMAL LOW (ref 6.5–8.1)

## 2019-11-24 LAB — MRSA PCR SCREENING: MRSA by PCR: NEGATIVE

## 2019-11-24 LAB — TYPE AND SCREEN
ABO/RH(D): O POS
Antibody Screen: NEGATIVE

## 2019-11-24 LAB — RESPIRATORY PANEL BY RT PCR (FLU A&B, COVID)
Influenza A by PCR: NEGATIVE
Influenza B by PCR: NEGATIVE
SARS Coronavirus 2 by RT PCR: NEGATIVE

## 2019-11-24 LAB — APTT: aPTT: 46 seconds — ABNORMAL HIGH (ref 24–36)

## 2019-11-24 LAB — GLUCOSE, CAPILLARY: Glucose-Capillary: 74 mg/dL (ref 70–99)

## 2019-11-24 MED ORDER — HYDRALAZINE HCL 20 MG/ML IJ SOLN
10.0000 mg | Freq: Four times a day (QID) | INTRAMUSCULAR | Status: DC | PRN
  Filled 2019-11-24 (×2): qty 1

## 2019-11-24 MED ORDER — PANTOPRAZOLE SODIUM 40 MG PO TBEC
40.0000 mg | DELAYED_RELEASE_TABLET | Freq: Every day | ORAL | Status: DC
  Administered 2019-11-25 – 2019-11-30 (×6): 40 mg via ORAL
  Filled 2019-11-24 (×6): qty 1

## 2019-11-24 MED ORDER — PIPERACILLIN-TAZOBACTAM 3.375 G IVPB 30 MIN: 3.3750 g | Freq: Three times a day (TID) | INTRAVENOUS | Status: DC

## 2019-11-24 MED ORDER — GABAPENTIN 100 MG PO CAPS
200.0000 mg | ORAL_CAPSULE | Freq: Every day | ORAL | Status: DC
  Administered 2019-11-24 – 2019-11-29 (×6): 200 mg via ORAL
  Filled 2019-11-24 (×6): qty 2

## 2019-11-24 MED ORDER — PIPERACILLIN-TAZOBACTAM 3.375 G IVPB
3.3750 g | Freq: Two times a day (BID) | INTRAVENOUS | Status: DC
  Administered 2019-11-24 – 2019-11-27 (×6): 3.375 g via INTRAVENOUS
  Filled 2019-11-24 (×6): qty 50

## 2019-11-24 MED ORDER — HYDROCORTISONE 0.5 % EX CREA
TOPICAL_CREAM | Freq: Three times a day (TID) | CUTANEOUS | Status: DC
  Filled 2019-11-24: qty 28.35

## 2019-11-24 MED ORDER — PIPERACILLIN-TAZOBACTAM 3.375 G IVPB 30 MIN
3.3750 g | Freq: Once | INTRAVENOUS | Status: DC
  Filled 2019-11-24: qty 50

## 2019-11-24 MED ORDER — VITAMIN B-12 1000 MCG PO TABS
1000.0000 ug | ORAL_TABLET | Freq: Every day | ORAL | Status: DC
  Administered 2019-11-26 – 2019-11-30 (×5): 1000 ug via ORAL
  Filled 2019-11-24 (×5): qty 1

## 2019-11-24 MED ORDER — VANCOMYCIN HCL 1250 MG/250ML IV SOLN: 1250.0000 mg | INTRAVENOUS | Status: DC

## 2019-11-24 MED ORDER — FLUOXETINE HCL 20 MG PO CAPS
20.0000 mg | ORAL_CAPSULE | Freq: Every day | ORAL | Status: DC
  Administered 2019-11-24 – 2019-11-29 (×2): 20 mg via ORAL
  Filled 2019-11-24 (×7): qty 1

## 2019-11-24 MED ORDER — ACETAMINOPHEN 500 MG PO TABS: 1000.0000 mg | ORAL_TABLET | Freq: Once | ORAL | Status: DC

## 2019-11-24 MED ORDER — CARVEDILOL 6.25 MG PO TABS
6.2500 mg | ORAL_TABLET | Freq: Two times a day (BID) | ORAL | Status: DC
  Administered 2019-11-24 – 2019-11-30 (×10): 6.25 mg via ORAL
  Filled 2019-11-24 (×11): qty 1

## 2019-11-24 MED ORDER — MECLIZINE HCL 12.5 MG PO TABS
12.5000 mg | ORAL_TABLET | Freq: Two times a day (BID) | ORAL | Status: DC
  Administered 2019-11-24 – 2019-11-30 (×11): 12.5 mg via ORAL
  Filled 2019-11-24 (×13): qty 1

## 2019-11-24 MED ORDER — ATORVASTATIN CALCIUM 20 MG PO TABS
20.0000 mg | ORAL_TABLET | Freq: Every day | ORAL | Status: DC
  Administered 2019-11-24 – 2019-11-30 (×6): 20 mg via ORAL
  Filled 2019-11-24 (×4): qty 1
  Filled 2019-11-24: qty 2
  Filled 2019-11-24: qty 1

## 2019-11-24 MED ORDER — FLUTICASONE PROPIONATE 50 MCG/ACT NA SUSP
2.0000 | Freq: Every day | NASAL | Status: DC
  Filled 2019-11-24: qty 16

## 2019-11-24 MED ORDER — CITALOPRAM HYDROBROMIDE 20 MG PO TABS
20.0000 mg | ORAL_TABLET | Freq: Every day | ORAL | Status: DC
  Administered 2019-11-26 – 2019-11-28 (×3): 20 mg via ORAL
  Filled 2019-11-24 (×4): qty 1

## 2019-11-24 MED ORDER — HYDROCODONE-ACETAMINOPHEN 5-325 MG PO TABS: 1.0000 | ORAL_TABLET | Freq: Four times a day (QID) | ORAL | Status: DC | PRN

## 2019-11-24 MED ORDER — FENTANYL CITRATE (PF) 100 MCG/2ML IJ SOLN
75.0000 ug | Freq: Once | INTRAMUSCULAR | Status: AC
  Administered 2019-11-24: 75 ug via INTRAVENOUS
  Filled 2019-11-24: qty 2

## 2019-11-24 MED ORDER — VANCOMYCIN HCL IN DEXTROSE 1-5 GM/200ML-% IV SOLN
1000.0000 mg | INTRAVENOUS | Status: DC
  Administered 2019-11-25: 1000 mg via INTRAVENOUS
  Filled 2019-11-24 (×3): qty 200

## 2019-11-24 MED ORDER — AMLODIPINE BESYLATE 10 MG PO TABS
10.0000 mg | ORAL_TABLET | Freq: Every day | ORAL | Status: DC
  Administered 2019-11-26 – 2019-11-30 (×5): 10 mg via ORAL
  Filled 2019-11-24 (×5): qty 1

## 2019-11-24 MED ORDER — SEVELAMER CARBONATE 800 MG PO TABS
2400.0000 mg | ORAL_TABLET | Freq: Three times a day (TID) | ORAL | Status: DC
  Administered 2019-11-26 – 2019-11-30 (×12): 2400 mg via ORAL
  Filled 2019-11-24 (×14): qty 3

## 2019-11-24 MED ORDER — GABAPENTIN 100 MG PO CAPS
100.0000 mg | ORAL_CAPSULE | Freq: Two times a day (BID) | ORAL | Status: DC
  Administered 2019-11-25 – 2019-11-29 (×8): 100 mg via ORAL
  Filled 2019-11-24 (×8): qty 1

## 2019-11-24 MED ORDER — ALBUTEROL SULFATE (2.5 MG/3ML) 0.083% IN NEBU: 2.5000 mg | INHALATION_SOLUTION | RESPIRATORY_TRACT | Status: DC | PRN

## 2019-11-24 MED ORDER — INSULIN ASPART 100 UNIT/ML ~~LOC~~ SOLN
0.0000 [IU] | Freq: Three times a day (TID) | SUBCUTANEOUS | Status: DC
  Administered 2019-11-26: 2 [IU] via SUBCUTANEOUS
  Administered 2019-11-26 – 2019-11-29 (×2): 1 [IU] via SUBCUTANEOUS
  Filled 2019-11-24 (×4): qty 1

## 2019-11-24 MED ORDER — LORAZEPAM 0.5 MG PO TABS
0.5000 mg | ORAL_TABLET | Freq: Four times a day (QID) | ORAL | Status: DC | PRN
  Administered 2019-11-27: 0.5 mg via ORAL
  Filled 2019-11-24: qty 1

## 2019-11-24 MED ORDER — SODIUM CHLORIDE 0.9 % IV SOLN
INTRAVENOUS | Status: DC | PRN
  Administered 2019-11-24 – 2019-11-26 (×2): 250 mL via INTRAVENOUS

## 2019-11-24 MED ORDER — ACETAMINOPHEN 650 MG RE SUPP: 650.0000 mg | Freq: Four times a day (QID) | RECTAL | Status: DC | PRN

## 2019-11-24 MED ORDER — VANCOMYCIN HCL IN DEXTROSE 1-5 GM/200ML-% IV SOLN
1000.0000 mg | Freq: Once | INTRAVENOUS | Status: DC
  Filled 2019-11-24: qty 200

## 2019-11-24 MED ORDER — HYDRALAZINE HCL 10 MG PO TABS
10.0000 mg | ORAL_TABLET | Freq: Three times a day (TID) | ORAL | Status: DC
  Administered 2019-11-24 – 2019-11-30 (×14): 10 mg via ORAL
  Filled 2019-11-24 (×19): qty 1

## 2019-11-24 MED ORDER — FENTANYL CITRATE (PF) 100 MCG/2ML IJ SOLN
50.0000 ug | Freq: Once | INTRAMUSCULAR | Status: AC
  Administered 2019-11-24: 50 ug via INTRAVENOUS
  Filled 2019-11-24: qty 2

## 2019-11-24 MED ORDER — VANCOMYCIN HCL 2000 MG/400ML IV SOLN
2000.0000 mg | Freq: Once | INTRAVENOUS | Status: AC
  Administered 2019-11-24: 2000 mg via INTRAVENOUS
  Filled 2019-11-24 (×2): qty 400

## 2019-11-24 MED ORDER — PANCRELIPASE (LIP-PROT-AMYL) 12000-38000 UNITS PO CPEP
36000.0000 [IU] | ORAL_CAPSULE | Freq: Three times a day (TID) | ORAL | Status: DC
  Administered 2019-11-26 – 2019-11-30 (×12): 36000 [IU] via ORAL
  Filled 2019-11-24 (×18): qty 3

## 2019-11-24 MED ORDER — SODIUM CHLORIDE 0.9 % IV SOLN
INTRAVENOUS | Status: DC | PRN
  Administered 2019-11-24: 250 mL via INTRAVENOUS

## 2019-11-24 MED ORDER — RENA-VITE PO TABS
1.0000 | ORAL_TABLET | Freq: Every day | ORAL | Status: DC
  Administered 2019-11-26 – 2019-11-30 (×5): 1 via ORAL
  Filled 2019-11-24 (×6): qty 1

## 2019-11-24 MED ORDER — SODIUM CHLORIDE 0.9% FLUSH
3.0000 mL | Freq: Two times a day (BID) | INTRAVENOUS | Status: DC
  Administered 2019-11-24 – 2019-11-27 (×4): 3 mL via INTRAVENOUS

## 2019-11-24 MED ORDER — ACETAMINOPHEN 325 MG PO TABS: 650.0000 mg | ORAL_TABLET | Freq: Four times a day (QID) | ORAL | Status: DC | PRN

## 2019-11-24 MED ORDER — NITROGLYCERIN 0.4 MG SL SUBL: 0.4000 mg | SUBLINGUAL_TABLET | SUBLINGUAL | Status: DC | PRN

## 2019-11-24 NOTE — H&P (Signed)
Chief Complaint: I have left arm swelling and pain HPI: [name] is an 59 y.o. female with medical history significant for end stage renal disease on hemodialysis for last fifteen years,MWF, pancreatic insufficiency,chronic essential hypertension, peripheral vascular disease, GERD, diabetes mellitus type II, anemia of chronic kidney disease and COPD. Patient was recently on admission and discharged on 02/25 on account of hemorraghic shock as a result  Infected left AV fistula graft.Patient underwent emergent graft removal and currently has a perm cath on right anterior chest for dialysis. She is currently at the nursing home and receives wound dressing daily and has a wound vac.She returns to the ED on account of complaints of worsening left upper extremity swelling and pain, worse over the past 24 hours. Denied any associated tingling sensation or loss of sensation. Admits to subjective chills but denies fever , nausea or vomiting.Denies any bleeding however from site of wound.she admits to being compliant with her medications including ASA and plavix.   Past Medical History:  Diagnosis Date  . Anemia   . Anginal pain ([place])   . Anxiety   . Arthritis   . Asthma   . Broken wrist   . Bronchitis   . chronic diastolic CHF 1/54/0086  . Chronic kidney disease   . COPD (chronic obstructive pulmonary disease) ([place])   . Coronary artery disease    a. cath 2013: stenting to RCA (report not available); b. cath 2014: LM nl, pLAD 40%, mLAD nl, ost LCx 40%, mid LCx nl, pRCA 30% @ site of prior stent, mRCA 50%  . Depression   . Diabetes mellitus ([place])   . Diabetes mellitus without complication ([place])   . Diabetic neuropathy ([place])   . dialysis 2006  . Diverticulosis   . Dizziness   . Dyspnea   . Elevated lipids   . Environmental and seasonal allergies   . ESRD (end stage renal disease) on dialysis ([place])    M-W-F  . Gastroparesis   . GERD (gastroesophageal reflux disease)   . Headache   .  History of anemia due to chronic kidney disease   . History of hiatal hernia   . HOH (hard of hearing)   . Hx of pancreatitis 2015  . Hypertension   . Lower extremity edema   . Mitral regurgitation    a. echo 10/2013: EF 62%, noWMA, mildly dilated LA, mild to mod MR/TR, GR1DD  . Myocardial infarction ([place])   . Orthopnea   . Parathyroid abnormality ([place])   . Peripheral arterial disease ([place])   . Pneumonia   . Renal cancer ([place])   . Renal insufficiency    Pt is on dialysis on M,W + F.  . Wheezing     Past Surgical History:  Procedure Laterality Date  . A/V SHUNTOGRAM Left 01/20/2018   Procedure: A/V SHUNTOGRAM;  Surgeon: Algernon Huxley, MD;  Location: [facility];  Service: Cardiovascular;  Laterality: Left;  . ABDOMINAL HYSTERECTOMY  1992  . AMPUTATION TOE Left 10/02/2017   Procedure: AMPUTATION TOE-LEFT GREAT TOE;  Surgeon: Albertine Patricia, DPM;  Location: [facility];  Service: Podiatry;  Laterality: Left;  . APPENDECTOMY    . ARTERY BIOPSY Right 10/11/2018   Procedure: BIOPSY TEMPORAL ARTERY;  Surgeon: Vickie Epley, MD;  Location: [facility];  Service: General;  Laterality: Right;  . CARDIAC CATHETERIZATION Left 07/26/2015   Procedure: Left Heart Cath and Coronary Angiography;  Surgeon: Dionisio David, MD;  Location: [facility];  Service: Cardiovascular;  Laterality: Left;  . CATARACT EXTRACTION W/ INTRAOCULAR LENS IMPLANT Right   . CATARACT EXTRACTION W/PHACO Left 03/10/2017   Procedure: CATARACT EXTRACTION PHACO AND INTRAOCULAR LENS PLACEMENT (IOC);  Surgeon: Birder Robson, MD;  Location: [facility];  Service: Ophthalmology;  Laterality: Left;  [place] 00:51.9 AP% 14.2 CDE 7.39 fluid pack lot # [id number]  . CENTRAL LINE INSERTION Right 11/11/2019   Procedure: CENTRAL LINE INSERTION;  Surgeon: Katha Cabal, MD;  Location: [facility];  Service: Vascular;  Laterality: Right;  . CHOLECYSTECTOMY    . COLONOSCOPY WITH PROPOFOL N/A 08/12/2016   Procedure: COLONOSCOPY  WITH PROPOFOL;  Surgeon: Lollie Sails, MD;  Location: [facility];  Service: Endoscopy;  Laterality: N/A;  . DIALYSIS FISTULA CREATION Left    upper arm  . dialysis grafts    . DIALYSIS/PERMA CATHETER INSERTION N/A 11/14/2019   Procedure: DIALYSIS/PERMA CATHETER INSERTION;  Surgeon: Algernon Huxley, MD;  Location: [facility];  Service: Cardiovascular;  Laterality: N/A;  . ESOPHAGOGASTRODUODENOSCOPY N/A 03/08/2015   Procedure: ESOPHAGOGASTRODUODENOSCOPY (EGD);  Surgeon: Manya Silvas, MD;  Location: [facility]COPY;  Service: Endoscopy;  Laterality: N/A;  . ESOPHAGOGASTRODUODENOSCOPY (EGD) WITH PROPOFOL N/A 03/18/2016   Procedure: ESOPHAGOGASTRODUODENOSCOPY (EGD) WITH PROPOFOL;  Surgeon: [name], MD;  Location: [facility] ENDOSCOPY;  Service: Endoscopy;  Laterality: N/A;  . EYE SURGERY Right 2018  . FECAL TRANSPLANT N/A 08/23/2015   Procedure: FECAL TRANSPLANT;  Surgeon: Manya Silvas, MD;  Location: [facility] ENDOSCOPY;  Service: Endoscopy;  Laterality: N/A;  . HAND SURGERY Bilateral   . IR RADIOLOGIST EVAL & MGMT  07/28/2019  . IR RADIOLOGIST EVAL & MGMT  08/11/2019  . LIGATION OF ARTERIOVENOUS  FISTULA Left 11/11/2019  . LIGATION OF ARTERIOVENOUS  FISTULA Left 11/11/2019   Procedure: LIGATION OF ARTERIOVENOUS  FISTULA;  Surgeon: Katha Cabal, MD;  Location: [facility];  Service: Vascular;  Laterality: Left;  . PERIPHERAL VASCULAR CATHETERIZATION N/A 12/20/2015   Procedure: Thrombectomy of dialysis access versus permcath placement;  Surgeon: Algernon Huxley, MD;  Location: [place];  Service: Cardiovascular;  Laterality: N/A;  . PERIPHERAL VASCULAR CATHETERIZATION N/A 12/20/2015   Procedure: A/V Shunt Intervention;  Surgeon: Algernon Huxley, MD;  Location: [facility];  Service: Cardiovascular;  Laterality: N/A;  . PERIPHERAL VASCULAR CATHETERIZATION N/A 12/20/2015   Procedure: A/V Shuntogram/Fistulagram;  Surgeon: Algernon Huxley, MD;  Location: [place] [facility];   Service: Cardiovascular;  Laterality: N/A;  . PERIPHERAL VASCULAR CATHETERIZATION N/A 01/02/2016   Procedure: A/V Shuntogram/Fistulagram;  Surgeon: Algernon Huxley, MD;  Location: [place] [facility];  Service: Cardiovascular;  Laterality: N/A;  . PERIPHERAL VASCULAR CATHETERIZATION N/A 01/02/2016   Procedure: A/V Shunt Intervention;  Surgeon: Algernon Huxley, MD;  Location: [place] [facility];  Service: Cardiovascular;  Laterality: N/A;    Family History  Problem Relation Age of Onset  . Kidney disease Mother   . Diabetes Mother   . Cancer Father   . Kidney disease Sister    Social History:  reports that she has never smoked. She has never used smokeless tobacco. She reports previous alcohol use. She reports current drug use. Drug: Marijuana.  Allergies:  Allergies  Allergen Reactions  . Ace Inhibitors Swelling and Anaphylaxis  . Ativan [Lorazepam] Other (See Comments)    Reaction:Hallucinations and headaches  . Compazine [Prochlorperazine Edisylate] Anaphylaxis, Nausea And Vomiting and Other (See Comments)    Other reaction(s): dystonia from this vs. Reglan, 23 Jul -  patient relates that she takes promethazine frequently with no problems  . Sumatriptan Succinate Other (See Comments)    Other reaction(s): delirium and hallucinations per [facility] records  . Dilaudid [Hydromorphone Hcl] Other (See Comments)    Delirium   . Ondansetron Other (See Comments)    hallucinations    . Zofran [Ondansetron Hcl] Other (See Comments)    Reaction:  hallucinations   . Dilaudid [Hydromorphone]   . Zofran [Ondansetron]     Per pt. she is allergic to zofran or will experience adverse reaction like hallucination   . Codeine Nausea And Vomiting  . Gabapentin Other (See Comments)    Reaction:  Unknown    . Lac Bovis Nausea And Vomiting  . Losartan Nausea Only  . Oxycodone Anxiety  . Prochlorperazine Other (See Comments)    Reaction:  Unknown . Patient does not remember reaction but she does have  vertigo and anxiety along with n and v at times. Could be used to treat any of these   . Reglan [Metoclopramide] Other (See Comments)    Per patient her Dr. Evelina Bucy her off it   . Scopolamine Other (See Comments)    Dizziness, also has vertigo already  . Tape Rash  . Tapentadol Rash    (Not in a hospital admission)   Results for orders placed or performed during the hospital encounter of 11/24/19 (from the past 48 hour(s))  CBC with Differential     Status: Abnormal   Collection Time: 11/24/19  2:37 PM  Result Value Ref Range   WBC 7.9 4.0 - 10.5 K/uL   RBC 3.67 (L) 3.87 - 5.11 MIL/uL   Hemoglobin 10.2 (L) 12.0 - 15.0 g/dL   HCT 32.1 (L) 36.0 - 46.0 %   MCV 87.5 80.0 - 100.0 fL   MCH 27.8 26.0 - 34.0 pg   MCHC 31.8 30.0 - 36.0 g/dL   RDW 17.6 (H) 11.5 - 15.5 %   Platelets 209 150 - 400 K/uL   nRBC 0.0 0.0 - 0.2 %   Neutrophils Relative % 46 %   Neutro Abs 3.6 1.7 - 7.7 K/uL   Lymphocytes Relative 30 %   Lymphs Abs 2.4 0.7 - 4.0 K/uL   Monocytes Relative 7 %   Monocytes Absolute 0.6 0.1 - 1.0 K/uL   Eosinophils Relative 16 %   Eosinophils Absolute 1.3 (H) 0.0 - 0.5 K/uL   Basophils Relative 1 %   Basophils Absolute 0.1 0.0 - 0.1 K/uL   Immature Granulocytes 0 %   Abs Immature Granulocytes 0.02 0.00 - 0.07 K/uL    Comment: Performed at [facility], [address] [place], [place] [address]  Comprehensive metabolic panel     Status: Abnormal   Collection Time: 11/24/19  2:37 PM  Result Value Ref Range   Sodium 131 (L) 135 - 145 mmol/L   Potassium 3.3 (L) 3.5 - 5.1 mmol/L   Chloride 95 (L) 98 - 111 mmol/L   CO2 29 22 - 32 mmol/L   Glucose, Bld 106 (H) 70 - 99 mg/dL    Comment: Glucose reference range applies only to samples taken after fasting for at least 8 hours.   BUN 5 (L) 6 - 20 mg/dL   Creatinine, Ser 3.49 (H) 0.44 - 1.00 mg/dL   Calcium 8.1 (L) 8.9 - 10.3 mg/dL   Total Protein 6.2 (L) 6.5 - 8.1 g/dL   Albumin 2.8 (L) 3.5 - 5.0 g/dL   AST 22 15 - 41  U/L   ALT <5 0 - 44 U/L   Alkaline Phosphatase 91 38 - 126 U/L   Total Bilirubin 0.5 0.3 - 1.2 mg/dL   GFR calc non Af Amer 14 (L) >60 mL/min   GFR calc Af Amer 16 (L) >60 mL/min   Anion gap 7 5 - 15    Comment: Performed at [facility], [address] [place], [place] [address]  Protime-INR     Status: None   Collection Time: 11/24/19  2:37 PM  Result Value Ref Range   Prothrombin Time 14.9 11.4 - 15.2 seconds   INR 1.2 0.8 - 1.2    Comment: (NOTE) INR goal varies based on device and disease states. Performed at [facility], [address] [place], [place] [address]   APTT     Status: Abnormal   Collection Time: 11/24/19  2:37 PM  Result Value Ref Range   aPTT 46 (H) 24 - 36 seconds    Comment:        IF BASELINE aPTT IS ELEVATED, SUGGEST PATIENT RISK ASSESSMENT BE USED TO DETERMINE APPROPRIATE ANTICOAGULANT THERAPY. Performed at [facility] [address] [place], [place] [address]   Type and screen     Status: None   Collection Time: 11/24/19  2:37 PM  Result Value Ref Range   ABO/RH(D) O POS    Antibody Screen NEG    Sample Expiration      11/27/2019,2359 Performed at [facility], [address] [place], [place] [address]   Respiratory Panel by RT PCR (Flu A&B, Covid) - Nasopharyngeal Swab     Status: None   Collection Time: 11/24/19  6:19 PM   Specimen: Nasopharyngeal Swab  Result Value Ref Range   SARS Coronavirus 2 by RT PCR NEGATIVE NEGATIVE    Comment: (NOTE) SARS-CoV-2 target nucleic acids are NOT DETECTED. The SARS-CoV-2 RNA is generally detectable in upper respiratoy specimens during the acute phase of infection. The lowest concentration of SARS-CoV-2 viral copies this assay can detect is 131 copies/mL. A negative result does not preclude SARS-Cov-2 infection and should not be used as the sole basis for treatment or other patient management decisions. A negative result may occur with  improper specimen  collection/handling, submission of specimen other than nasopharyngeal swab, presence of viral mutation(s) within the areas targeted by this assay, and inadequate number of viral copies (<131 copies/mL). A negative result must be combined with clinical observations, patient history, and epidemiological information. The expected result is Negative. Fact Sheet for Patients:  PinkCheek.be Fact Sheet for Healthcare Providers:  GravelBags.it This test is not yet ap proved or cleared by the [place] FDA and  has been authorized for detection and/or diagnosis of SARS-CoV-2 by FDA under an Emergency Use Authorization (EUA). This EUA will remain  in effect (meaning this test can be used) for the duration of the COVID-19 declaration under Section 564(b)(1) of the Act, 21 U.S.C. section 360bbb-3(b)(1), unless the authorization is terminated or revoked sooner.    Influenza A by PCR NEGATIVE NEGATIVE   Influenza B by PCR NEGATIVE NEGATIVE    Comment: (NOTE) The Xpert Xpress SARS-CoV-2/FLU/RSV assay is intended as an aid in  the diagnosis of influenza from Nasopharyngeal swab specimens and  should not be used as a sole basis for treatment. Nasal washings and  aspirates are unacceptable for Xpert Xpress SARS-CoV-2/FLU/RSV  testing. Fact Sheet for Patients: PinkCheek.be Fact Sheet for Healthcare Providers: GravelBags.it This test is not yet approved  or cleared by the [place] and  has been authorized for detection and/or diagnosis of SARS-CoV-2 by  FDA under an Emergency Use Authorization (EUA). This EUA will remain  in effect (meaning this test can be used) for the duration of the  Covid-19 declaration under Section 564(b)(1) of the Act, 21  U.S.C. section 360bbb-3(b)(1), unless the authorization is  terminated or revoked. Performed at [facility], [address] [place], [place] [address]    CT HUMERUS LEFT WO CONTRAST  Result Date: 11/24/2019 CLINICAL DATA:  End-stage renal disease on hemodialysis with soft tissue swelling involving the left upper extremity. Patient had an infected left arm brachial artery AV graft EXAM: CT OF THE UPPER LEFT EXTREMITY WITHOUT CONTRAST TECHNIQUE: Multidetector CT imaging of the upper left extremity was performed according to the standard protocol. COMPARISON:  None. FINDINGS: There is a moderate size subcutaneous hematoma noted in the anterior aspect of the left upper arm. This measures 4.5 x 3.0 cm. There is a small hematoma just below this. Open wound is noted along the anterior aspect of the upper arm. There is diffuse subcutaneous soft tissue swelling/edema/fluid involving the entire left upper extremity. I do not see a discrete drainable soft tissue abscess. Suspect diffuse myofasciitis without definite findings for pyomyositis. No findings suspicious for septic arthritis or osteomyelitis. Suspect remote distal radius fracture with volar impaction. IMPRESSION: 1. Diffuse subcutaneous soft tissue swelling/edema/fluid involving the entire left upper extremity without discrete drainable soft tissue abscess. 2. Diffuse myofasciitis without definite findings for pyomyositis. 3. Moderate-sized subcutaneous hematoma involving the anterior aspect of the upper arm. Electronically Signed   By: Marijo Sanes M.D.   On: 11/24/2019 16:57   CT FOREARM LEFT WO CONTRAST  Result Date: 11/24/2019 CLINICAL DATA:  End-stage renal disease on hemodialysis with soft tissue swelling involving the left upper extremity. Patient had an infected left arm brachial artery AV graft EXAM: CT OF THE UPPER LEFT EXTREMITY WITHOUT CONTRAST TECHNIQUE: Multidetector CT imaging of the upper left extremity was performed according to the standard protocol. COMPARISON:  None. FINDINGS: There is a moderate size subcutaneous hematoma noted in the anterior  aspect of the left upper arm. This measures 4.5 x 3.0 cm. There is a small hematoma just below this. Open wound is noted along the anterior aspect of the upper arm. There is diffuse subcutaneous soft tissue swelling/edema/fluid involving the entire left upper extremity. I do not see a discrete drainable soft tissue abscess. Suspect diffuse myofasciitis without definite findings for pyomyositis. No findings suspicious for septic arthritis or osteomyelitis. Suspect remote distal radius fracture with volar impaction. IMPRESSION: 1. Diffuse subcutaneous soft tissue swelling/edema/fluid involving the entire left upper extremity without discrete drainable soft tissue abscess. 2. Diffuse myofasciitis without definite findings for pyomyositis. 3. Moderate-sized subcutaneous hematoma involving the anterior aspect of the upper arm. Electronically Signed   By: Marijo Sanes M.D.   On: 11/24/2019 16:57   CT HAND LEFT WO CONTRAST  Result Date: 11/24/2019 CLINICAL DATA:  End-stage renal disease on hemodialysis with soft tissue swelling involving the left upper extremity. Patient had an infected left arm brachial artery AV graft EXAM: CT OF THE UPPER LEFT EXTREMITY WITHOUT CONTRAST TECHNIQUE: Multidetector CT imaging of the upper left extremity was performed according to the standard protocol. COMPARISON:  None. FINDINGS: There is a moderate size subcutaneous hematoma noted in the anterior aspect of the left upper arm. This measures 4.5 x 3.0 cm. There is a small hematoma just below this.  Open wound is noted along the anterior aspect of the upper arm. There is diffuse subcutaneous soft tissue swelling/edema/fluid involving the entire left upper extremity. I do not see a discrete drainable soft tissue abscess. Suspect diffuse myofasciitis without definite findings for pyomyositis. No findings suspicious for septic arthritis or osteomyelitis. Suspect remote distal radius fracture with volar impaction. IMPRESSION: 1. Diffuse  subcutaneous soft tissue swelling/edema/fluid involving the entire left upper extremity without discrete drainable soft tissue abscess. 2. Diffuse myofasciitis without definite findings for pyomyositis. 3. Moderate-sized subcutaneous hematoma involving the anterior aspect of the upper arm. Electronically Signed   By: Marijo Sanes M.D.   On: 11/24/2019 16:57    Review of Systems  All other systems reviewed and are negative.   Blood pressure (!) 177/74, pulse (!) 55, temperature 97.9 F (36.6 C), temperature source Oral, resp. rate (!) 0, height 5\' 5"  (1.651 m), weight 81.6 kg, SpO2 97 %. Physical Exam  Constitutional: She is oriented to person, place, and time. She appears well-developed and well-nourished.  HENT:  Head: Normocephalic.  Eyes: Pupils are equal, round, and reactive to light.  Cardiovascular: Normal rate, regular rhythm and normal heart sounds.  Respiratory: Effort normal and breath sounds normal.  GI: Soft. Bowel sounds are normal.  Musculoskeletal:        General: Edema (left upper extremity) present.     Cervical back: Normal range of motion.  Neurological: She is alert and oriented to person, place, and time. She has normal reflexes.  Skin:     LUE swelling and warmth, dressing in placeLeft radial pulse palpable but weak. No evidence of cyanosis.Sensory intact. Palpable left medial arm , lesion, tender to touch , no fluctuant or pulsatile. RUE Old AV graft noted.  Psychiatric: She has a normal mood and affect. Her speech is normal and behavior is normal.     Assessment/Plan 1.  Left arm swelling: New and present on admission. Patient is s/p AV graft removal recently and uses wound vac at the facility. CT scan suggests diffuse LUE tissue sweliing and edema, differentials include myofascitis.Evidence of hematoma also noted. Patient was assessed in the ED by Vascular surgery with plans for surgical intervention in a.m.Vascular surgery PA's notes appreciated.Blood cultures  were sent from the ED.  2. ESRD on HD MWF:  Via Perm catheter. Nephrology consult advised for further management. Continue MWF schedule using RIJ permcath  3.  Chronic Essential Hypertension. BP accelerated this pm. Optimize with Hydralazine prn. Continue with home medications  4.  Anemia of chronic kidney disease . Hemoglobin remains stable. Continue with CBC monitoring daily. Type and screen against blood.Hold Plavix and ASA due to active risk of bleeding  5.  Secondary hyperparathyroidism: Continue with home dose of sevelamer   6. History of bradycardia: Maintain on telemetry monitoring over the next 24 hours.   7. Poor peripheral IV access- Patient will temporarily be considered for EJ line. Consideration for port A catheter may be warranted in the future due to extremely poor peripheral IV access bilaterally.  VTE prophylaxis with SCD's only due to hematoma.  [place], MD 11/24/2019, 7:15 PM

## 2019-11-24 NOTE — ED Notes (Signed)
Antibiotics ok to start without blood cultures being drawn first per [place].

## 2019-11-24 NOTE — Progress Notes (Signed)
Pharmacy Antibiotic Note  [name] is a 59 y.o. female admitted on 11/24/2019 with wound infection.  Pharmacy has been consulted for Vancomycin , Zosyn dosing.  Pt on HD every MWF.   Plan: Zosyn 3.375 gm IV Q12H EI ordered to start on 3/4 @ 2200.   Vancomycin 2 gm IV X 1 ordered for loading dose on 3/4 @ 2200. Vancomycin 1 gm IV Q MWF - HD ordered to start 3/5  @ 1000.  Will draw 1st trough before 3rd HD session on 3/10.   Height: 5\' 5"  (165.1 cm) Weight: 180 lb (81.6 kg) IBW/kg (Calculated) : 57  Temp (24hrs), Avg:97.9 F (36.6 C), Min:97.8 F (36.6 C), Max:97.9 F (36.6 C)  Recent Labs  Lab 11/18/19 0930 11/21/19 1100 11/24/19 1437  WBC 10.6* 7.0 7.9  CREATININE 4.61* 5.90* 3.49*    Estimated Creatinine Clearance: 18.1 mL/min (A) (by C-G formula based on SCr of 3.49 mg/dL (H)).    Allergies  Allergen Reactions  . Ace Inhibitors Swelling and Anaphylaxis  . Ativan [Lorazepam] Other (See Comments)    Reaction:Hallucinations and headaches  . Compazine [Prochlorperazine Edisylate] Anaphylaxis, Nausea And Vomiting and Other (See Comments)    Other reaction(s): dystonia from this vs. Reglan, 23 Jul - patient relates that she takes promethazine frequently with no problems  . Sumatriptan Succinate Other (See Comments)    Other reaction(s): delirium and hallucinations per [facility] records  . Dilaudid [Hydromorphone Hcl] Other (See Comments)    Delirium   . Ondansetron Other (See Comments)    hallucinations    . Zofran [Ondansetron Hcl] Other (See Comments)    Reaction:  hallucinations   . Dilaudid [Hydromorphone]   . Zofran [Ondansetron]     Per pt. she is allergic to zofran or will experience adverse reaction like hallucination   . Codeine Nausea And Vomiting  . Gabapentin Other (See Comments)    Reaction:  Unknown    . Lac Bovis Nausea And Vomiting  . Losartan Nausea Only  . Oxycodone Anxiety  . Prochlorperazine Other (See Comments)    Reaction:  Unknown . Patient  does not remember reaction but she does have vertigo and anxiety along with n and v at times. Could be used to treat any of these   . Reglan [Metoclopramide] Other (See Comments)    Per patient her Dr. Evelina Bucy her off it   . Scopolamine Other (See Comments)    Dizziness, also has vertigo already  . Tape Rash  . Tapentadol Rash    Antimicrobials this admission:   >>    >>   Dose adjustments this admission:   Microbiology results:  BCx:   UCx:    Sputum:    MRSA PCR:   Thank you for allowing pharmacy to be a part of this patient's care.  Scotti Motter D 11/24/2019 9:24 PM

## 2019-11-24 NOTE — ED Triage Notes (Signed)
Pt to ED from [facility] with left arm pain from a procedure on 11/11/19. Pt has a fistula in her left arm. Pt has significant swelling to left arm all the way to fingers. Hx of DM, HTN, ESRD.Pt last had percocet at 0900.

## 2019-11-24 NOTE — Progress Notes (Signed)
[facility]  Communication Note Based on CT scan results possible hematoma / abscess / fluid collection in LUE Plan for I&D and washout in OR in the AM Will pre-op.  Discussed with Dr. Eber Hong Kearsten Ginther PA-C 11/24/2019 5:46 PM

## 2019-11-24 NOTE — ED Provider Notes (Signed)
[facility] ([place]) Emergency Department Provider Note  ____________________________________________   First MD Initiated Contact with Patient 11/24/19 1238     (approximate)  I have reviewed the triage vital signs and the nursing notes.   HISTORY  Chief Complaint Extremity Pain    HPI [name] is a 59 y.o. female end-stage renal disease on hemodialysis (M, W, F), peripheral vascular disease, hypertension, type 2 diabetes mellitus with peripheral neuropathy and obesity presented with uncontrollable bleeding from left upper extremity AV fistula. On review of records during this admissionshe was found to be minimally responsive and in hemorrhagic shock. Her hemoglobin was 5.5. She received 2 unit PRBC and taken to the OR by vascular surgeon and underwent excision of the infected left arm brachial artery AV graft and placement of wound VAC.   Patient states that yesterday they noticed a little bit of bleeding coming out of the wound VAC tubing.  I removed the wound VAC and put some dressings on the wound.  Today she had increasing swelling that was severe, constant, nothing made it better, nothing made it worse.  Has been taking oxycodone with no relief.          Past Medical History:  Diagnosis Date  . Anemia   . Anginal pain ([place])   . Anxiety   . Arthritis   . Asthma   . Broken wrist   . Bronchitis   . chronic diastolic CHF 9/76/7341  . Chronic kidney disease   . COPD (chronic obstructive pulmonary disease) ([place])   . Coronary artery disease    a. cath 2013: stenting to RCA (report not available); b. cath 2014: LM nl, pLAD 40%, mLAD nl, ost LCx 40%, mid LCx nl, pRCA 30% @ site of prior stent, mRCA 50%  . Depression   . Diabetes mellitus ([place])   . Diabetes mellitus without complication ([place])   . Diabetic neuropathy ([place])   . dialysis 2006  . Diverticulosis   . Dizziness   . Dyspnea   . Elevated lipids   . Environmental and seasonal allergies     . ESRD (end stage renal disease) on dialysis ([place])    M-W-F  . Gastroparesis   . GERD (gastroesophageal reflux disease)   . Headache   . History of anemia due to chronic kidney disease   . History of hiatal hernia   . HOH (hard of hearing)   . Hx of pancreatitis 2015  . Hypertension   . Lower extremity edema   . Mitral regurgitation    a. echo 10/2013: EF 62%, noWMA, mildly dilated LA, mild to mod MR/TR, GR1DD  . Myocardial infarction ([place])   . Orthopnea   . Parathyroid abnormality ([place])   . Peripheral arterial disease ([place])   . Pneumonia   . Renal cancer ([place])   . Renal insufficiency    Pt is on dialysis on M,W + F.  . Wheezing     Patient Active Problem List   Diagnosis Date Noted  . Respiratory failure ([place])   . Shortness of breath   . Complication of arteriovenous dialysis fistula 11/13/2019  . PVD (peripheral vascular disease) ([place]) 11/13/2019  . Severe anemia 11/13/2019  . Pressure injury of skin 11/13/2019  . ESRD on hemodialysis ([place])   . Hemorrhagic shock ([place]) 11/11/2019  . Leg pain 10/02/2019  . CAP (community acquired pneumonia) 10/01/2019  . Atherosclerosis of native arteries of extremity with intermittent claudication ([place]) 05/01/2019  . Right-sided headache   .  COPD (chronic obstructive pulmonary disease) ([place]) 10/06/2018  . Syncope 10/04/2018  . Symptomatic anemia 06/18/2018  . Gastroparesis due to DM ([place]) 01/18/2018  . Complication of vascular access for dialysis 12/04/2017  . Osteomyelitis ([place]) 09/30/2017  . Carotid stenosis 06/18/2017  . Shortness of breath 05/04/2017  . Cellulitis of lower extremity 07/29/2016  . Chronic venous insufficiency 07/29/2016  . Lymphedema 07/29/2016  . TIA (transient ischemic attack) 04/21/2016  . Altered mental status 04/08/2016  . Hyperammonemia ([place]) 04/08/2016  . Elevated troponin 04/08/2016  . Depression 04/08/2016  . Depression, major, recurrent, severe with psychosis ([place]) 04/08/2016  . Blood in stool    . Intractable cyclical vomiting with nausea   . Reflux esophagitis   . Gastritis   . Generalized abdominal pain   . Uncontrollable vomiting   . Major depressive disorder, recurrent episode, moderate ([place]) 03/15/2016  . Adjustment disorder with mixed anxiety and depressed mood 03/15/2016  . Malnutrition of moderate degree 12/01/2015  . Renal mass   . Dyspnea   . Acute renal failure ([place])   . Respiratory failure ([place])   . High temperature 11/14/2015  . Pulmonary edema   . Encounter for central line placement   . Encounter for orogastric (OG) tube placement   . Nausea 11/12/2015  . Hyperkalemia 10/03/2015  . Diarrhea, unspecified 07/22/2015  . Pneumonia 05/21/2015  . Hypoglycemia 04/24/2015  . Unresponsiveness 04/24/2015  . Bradycardia 04/24/2015  . Hypothermia 04/24/2015  . Acute respiratory failure ([place]) 04/24/2015  . Acute diastolic CHF (congestive heart failure) ([place]) 04/05/2015  . Diabetic gastroparesis ([place]) 04/05/2015  . Hypokalemia 04/05/2015  . Generalized weakness 04/05/2015  . Acute pulmonary edema ([place]) 04/03/2015  . Nausea and vomiting 04/03/2015  . Hypoglycemia associated with diabetes ([place]) 04/03/2015  . Anemia of chronic disease 04/03/2015  . Secondary hyperparathyroidism ([place]) 04/03/2015  . Pressure ulcer 04/02/2015  . Acute respiratory failure with hypoxia ([place]) 04/01/2015  . Adjustment disorder with anxiety 03/14/2015  . Somatic symptom disorder, mild 03/08/2015  . Coronary artery disease involving native coronary artery of native heart without angina pectoris   . Nausea & vomiting 03/06/2015  . Abdominal pain 03/06/2015  . DM (diabetes mellitus) ([place]) 03/06/2015  . HTN (hypertension) 03/06/2015  . Gastroparesis 02/24/2015  . Pleural effusion 02/19/2015  . HCAP (healthcare-associated pneumonia) 02/19/2015  . End-stage renal disease on hemodialysis ([place]) 02/19/2015    Past Surgical History:  Procedure Laterality Date  . A/V SHUNTOGRAM Left 01/20/2018    Procedure: A/V SHUNTOGRAM;  Surgeon: Algernon Huxley, MD;  Location: [place] [facility];  Service: Cardiovascular;  Laterality: Left;  . ABDOMINAL HYSTERECTOMY  1992  . AMPUTATION TOE Left 10/02/2017   Procedure: AMPUTATION TOE-LEFT GREAT TOE;  Surgeon: Albertine Patricia, DPM;  Location: [facility] ORS;  Service: Podiatry;  Laterality: Left;  . APPENDECTOMY    . ARTERY BIOPSY Right 10/11/2018   Procedure: BIOPSY TEMPORAL ARTERY;  Surgeon: Vickie Epley, MD;  Location: [facility]S;  Service: General;  Laterality: Right;  . CARDIAC CATHETERIZATION Left 07/26/2015   Procedure: Left Heart Cath and Coronary Angiography;  Surgeon: Dionisio David, MD;  Location: [place] [facility];  Service: Cardiovascular;  Laterality: Left;  . CATARACT EXTRACTION W/ INTRAOCULAR LENS IMPLANT Right   . CATARACT EXTRACTION W/PHACO Left 03/10/2017   Procedure: CATARACT EXTRACTION PHACO AND INTRAOCULAR LENS PLACEMENT (IOC);  Surgeon: Birder Robson, MD;  Location: [facility];  Service: Ophthalmology;  Laterality: Left;  [place] 00:51.9 AP% 14.2 CDE 7.39 fluid pack lot # [id number]  .  CENTRAL LINE INSERTION Right 11/11/2019   Procedure: CENTRAL LINE INSERTION;  Surgeon: Katha Cabal, MD;  Location: [facility];  Service: Vascular;  Laterality: Right;  . CHOLECYSTECTOMY    . COLONOSCOPY WITH PROPOFOL N/A 08/12/2016   Procedure: COLONOSCOPY WITH PROPOFOL;  Surgeon: Lollie Sails, MD;  Location: [facility] ENDOSCOPY;  Service: Endoscopy;  Laterality: N/A;  . DIALYSIS FISTULA CREATION Left    upper arm  . dialysis grafts    . DIALYSIS/PERMA CATHETER INSERTION N/A 11/14/2019   Procedure: DIALYSIS/PERMA CATHETER INSERTION;  Surgeon: Algernon Huxley, MD;  Location: [facility];  Service: Cardiovascular;  Laterality: N/A;  . ESOPHAGOGASTRODUODENOSCOPY N/A 03/08/2015   Procedure: ESOPHAGOGASTRODUODENOSCOPY (EGD);  Surgeon: Manya Silvas, MD;  Location: [facility] ENDOSCOPY;  Service: Endoscopy;  Laterality: N/A;  .  ESOPHAGOGASTRODUODENOSCOPY (EGD) WITH PROPOFOL N/A 03/18/2016   Procedure: ESOPHAGOGASTRODUODENOSCOPY (EGD) WITH PROPOFOL;  Surgeon: [name], MD;  Location: [facility] ENDOSCOPY;  Service: Endoscopy;  Laterality: N/A;  . EYE SURGERY Right 2018  . FECAL TRANSPLANT N/A 08/23/2015   Procedure: FECAL TRANSPLANT;  Surgeon: Manya Silvas, MD;  Location: [facility] ENDOSCOPY;  Service: Endoscopy;  Laterality: N/A;  . HAND SURGERY Bilateral   . IR RADIOLOGIST EVAL & MGMT  07/28/2019  . IR RADIOLOGIST EVAL & MGMT  08/11/2019  . LIGATION OF ARTERIOVENOUS  FISTULA Left 11/11/2019  . LIGATION OF ARTERIOVENOUS  FISTULA Left 11/11/2019   Procedure: LIGATION OF ARTERIOVENOUS  FISTULA;  Surgeon: Katha Cabal, MD;  Location: [facility] OR[facility];  Service: Vascular;  Laterality: Left;  . PERIPHERAL VASCULAR CATHETERIZATION N/A 12/20/2015   Procedure: Thrombectomy of dialysis access versus permcath placement;  Surgeon: Algernon Huxley, MD;  Location: [place] [facility];  Service: Cardiovascular;  Laterality: N/A;  . PERIPHERAL VASCULAR CATHETERIZATION N/A 12/20/2015   Procedure: A/V Shunt Intervention;  Surgeon: Algernon Huxley, MD;  Location: [place] [facility];  Service: Cardiovascular;  Laterality: N/A;  . PERIPHERAL VASCULAR CATHETERIZATION N/A 12/20/2015   Procedure: A/V Shuntogram/Fistulagram;  Surgeon: Algernon Huxley, MD;  Location: [place] [facility];  Service: Cardiovascular;  Laterality: N/A;  . PERIPHERAL VASCULAR CATHETERIZATION N/A 01/02/2016   Procedure: A/V Shuntogram/Fistulagram;  Surgeon: Algernon Huxley, MD;  Location: [place] [facility];  Service: Cardiovascular;  Laterality: N/A;  . PERIPHERAL VASCULAR CATHETERIZATION N/A 01/02/2016   Procedure: A/V Shunt Intervention;  Surgeon: Algernon Huxley, MD;  Location: [place] [facility];  Service: Cardiovascular;  Laterality: N/A;    Prior to Admission medications   Medication Sig Start Date End Date Taking? Authorizing Provider  albuterol (PROVENTIL HFA;VENTOLIN HFA) 108 (90  Base) MCG/ACT inhaler Inhale 2 puffs into the lungs every 6 (six) hours as needed for wheezing or shortness of breath. 02/17/18   Merlyn Lot, MD  albuterol (PROVENTIL) (2.5 MG/3ML) 0.083% nebulizer solution Take 2.5 mg by nebulization every 4 (four) hours as needed for wheezing or shortness of breath.    [provider]  albuterol (VENTOLIN HFA) 108 (90 Base) MCG/ACT inhaler Inhale 2 puffs into the lungs every 4 (four) hours as needed for wheezing or shortness of breath. 11/09/19   [provider]  amLODipine (NORVASC) 10 MG tablet Take 10 mg by mouth daily.    [provider]  amLODipine (NORVASC) 10 MG tablet Take 10 mg by mouth daily. 10/24/19   [provider]  aspirin EC 81 MG tablet Take 81 mg by mouth daily. 09/08/16   [provider]  aspirin EC 81 MG tablet Take  81 mg by mouth daily.    [provider]  atorvastatin (LIPITOR) 20 MG tablet Take 20 mg by mouth every evening.  12/02/16   [provider]  atorvastatin (LIPITOR) 20 MG tablet Take 20 mg by mouth daily. 10/24/19   [provider]  carvedilol (COREG) 3.125 MG tablet Take 1 tablet (3.125 mg total) by mouth 2 (two) times daily with a meal. 10/11/18   Epifanio Lesches, MD  carvedilol (COREG) 6.25 MG tablet Take 6.25 mg by mouth 2 (two) times daily. 10/24/19   [provider]  chlorpheniramine-HYDROcodone (TUSSIONEX) 10-8 MG/5ML SUER Take 5 mLs by mouth every 12 (twelve) hours. 10/11/18   Epifanio Lesches, MD  cholecalciferol (VITAMIN D) 400 units TABS tablet Take 400 Units by mouth daily.    [provider]  citalopram (CELEXA) 20 MG tablet Take 20 mg by mouth daily. 10/29/19   [provider]  clopidogrel (PLAVIX) 75 MG tablet Take 75 mg by mouth daily. 07/01/19   [provider]  CREON 12000 units CPEP capsule Take 3 Units by mouth 3 (three) times daily.  10/21/19   [provider]  cyanocobalamin 1000 MCG tablet Take  1,000 mcg by mouth daily.     [provider]  cyclobenzaprine (FLEXERIL) 10 MG tablet Take 1 tablet (10 mg total) by mouth 3 (three) times daily as needed. 11/22/18   Sable Feil, PA-C  diclofenac sodium (VOLTAREN) 1 % GEL Apply 1 application topically daily as needed (for rash).    [provider]  ferrous sulfate ([employer]S IRON) 325 (65 FE) MG tablet Take 325 mg by mouth daily with breakfast.    [provider]  FLUoxetine (PROZAC) 20 MG capsule Take 1 capsule (20 mg total) by mouth daily. Patient taking differently: Take 20 mg by mouth at bedtime.  04/10/16   Hower, Aaron Mose, MD  fluticasone (FLONASE) 50 MCG/ACT nasal spray Place 2 sprays into both nostrils daily. 09/28/19   [provider]  furosemide (LASIX) 80 MG tablet Take 80 mg by mouth daily. 07/09/19   [provider]  gabapentin (NEURONTIN) 100 MG capsule Take 200 mg by mouth at bedtime.  10/29/19   [provider]  HYDROcodone-acetaminophen (NORCO/VICODIN) 5-325 MG tablet Take 1 tablet by mouth 4 (four) times daily as needed for pain. 10/20/19   [provider]  hydrocortisone cream 0.5 % Apply topically 3 (three) times daily. 01/21/18   Wieting, Richard, MD  insulin aspart (NOVOLOG FLEXPEN) 100 UNIT/ML FlexPen Inject 4-15 Units into the skin 4 (four) times daily as needed for high blood sugar (> 100, per sliding scale).    [provider]  lipase/protease/amylase (CREON) 12000 UNITS CPEP capsule Take 36000 mg in the morning, 24000 midday & 36000 in the evening    [provider]  LORazepam (ATIVAN PO) Take 1 Dose by mouth as needed.     [provider]  meclizine (ANTIVERT) 25 MG tablet Take 25 mg by mouth every 6 (six) hours as needed for dizziness. 11/01/19   [provider]  multivitamin (RENA-VIT) TABS tablet Take 1 tablet by mouth daily.     [provider]  mupirocin ointment (BACTROBAN) 2 % Apply to affected area twice daily 11/09/19  11/08/20  Duffy Bruce, MD  mupirocin ointment (BACTROBAN) 2 % Apply 1 application topically 3 (three) times daily. 11/09/19   [provider]  nitroGLYCERIN (NITROSTAT) 0.4 MG SL tablet Place 0.4 mg under the tongue every 5 (five) minutes as  needed for chest pain.     [provider]  pantoprazole (PROTONIX) 40 MG tablet Take 40 mg by mouth daily. 08/28/19   [provider]  promethazine (PHENERGAN) 12.5 MG tablet Take 1 tablet (12.5 mg total) by mouth every 8 (eight) hours as needed for nausea. 11/17/19   Lorella Nimrod, MD  sevelamer carbonate (RENVELA) 800 MG tablet Take 800-2,400 mg by mouth See admin instructions. Take 2400 mg by mouth 3 times daily with meals and take 800 mg by mouth with snacks    [provider]  sevelamer carbonate (RENVELA) 800 MG tablet Take 3 tablets (2,400 mg total) by mouth 3 (three) times daily with meals. 11/17/19   Lorella Nimrod, MD    Allergies Ace inhibitors, Ativan [lorazepam], Compazine [prochlorperazine edisylate], Sumatriptan succinate, Dilaudid [hydromorphone hcl], Ondansetron, Zofran [ondansetron hcl], Dilaudid [hydromorphone], Zofran [ondansetron], Codeine, Gabapentin, [place], Losartan, Oxycodone, Prochlorperazine, Reglan [metoclopramide], Scopolamine, Tape, and Tapentadol  Family History  Problem Relation Age of Onset  . Kidney disease Mother   . Diabetes Mother   . Cancer Father   . Kidney disease Sister     Social History Social History   Tobacco Use  . Smoking status: Never Smoker  . Smokeless tobacco: Never Used  Substance Use Topics  . Alcohol use: Not Currently    Comment: glass wine week per pt  . Drug use: Yes    Types: Marijuana    Comment: once a day      Review of Systems Constitutional: No fever/chills Eyes: No visual changes. ENT: No sore throat. Cardiovascular: Denies chest pain. Respiratory: Denies shortness of breath. Gastrointestinal: No abdominal pain.  No nausea, no vomiting.   No diarrhea.  No constipation. Genitourinary: Negative for dysuria. Musculoskeletal: Negative for back pain.  Positive left arm swelling Skin: Negative for rash. Neurological: Negative for headaches, focal weakness or numbness. All other ROS negative ____________________________________________   PHYSICAL EXAM:  VITAL SIGNS: Vitals:   11/24/19 1246  BP: (!) 171/87  Pulse: 60  Resp: 16  Temp: 97.9 F (36.6 C)  SpO2: 100%    Constitutional: Ale rt and oriented.  But does appear to be in pain. Eyes: Conjunctivae are normal. EOMI. Head: Atraumatic. Nose: No congestion/rhinnorhea. Mouth/Throat: Mucous membranes are moist.   Neck: No stridor. Trachea Midline. FROM Cardiovascular: Normal rate, regular rhythm. Grossly normal heart sounds.  Good peripheral circulation. Respiratory: Normal respiratory effort.  No retractions. Lungs CTAB. Gastrointestinal: Soft and nontender. No distention. No abdominal bruits.  Musculoskeletal: Left arm with significant swelling throughout.  Good distal pulse.  Area open wound with some dressing on top of the.  Some old sutures in place as well as some old staples lower down the arm. Neurologic:  Normal speech and language. No gross focal neurologic deficits are appreciated.  Skin:  Skin is warm, dry and intact. No rash noted. Psychiatric: Mood and affect are normal. Speech and behavior are normal. GU: Deferred   ____________________________________________   LABS (all labs ordered are listed, but only abnormal results are displayed)  Labs Reviewed  CBC WITH DIFFERENTIAL/PLATELET - Abnormal; Notable for the following components:      Result Value   RBC 3.67 (*)    Hemoglobin 10.2 (*)    HCT 32.1 (*)    RDW 17.6 (*)    Eosinophils Absolute 1.3 (*)    All other components within normal limits  COMPREHENSIVE METABOLIC PANEL  PROTIME-INR  APTT  TYPE AND SCREEN   ____________________________________________  RADIOLOGY Jon Billings  Ayuub Penley,  personally viewed and evaluated these images (plain radiographs) as part of my medical decision making, as well as reviewing the written report by the radiologist.  ED MD interpretation: Pending  Official radiology report(s): No results found.  ____________________________________________   PROCEDURES  Procedure(s) performed (including Critical Care):  Ultrasound ED Peripheral IV (Provider)  Date/Time: 11/24/2019 2:49 PM Performed by: Vanessa [place], MD Authorized by: Vanessa [place], MD   Procedure details:    Indications: multiple failed IV attempts     Skin Prep: chlorhexidine gluconate     Location:  Right forearm   Angiocath:  20 G   Bedside Ultrasound Guided: Yes     Images: not archived     Patient tolerated procedure without complications: Yes     Dressing applied: Yes   Ultrasound ED Peripheral IV (Provider)  Date/Time: 11/24/2019 2:49 PM Performed by: Vanessa [place], MD Authorized by: Vanessa [place], MD   Procedure details:    Indications: multiple failed IV attempts     Skin Prep: chlorhexidine gluconate     Location: right upper arm    Angiocath:  20 G   Images: not archived     Patient tolerated procedure without complications: Yes     Dressing applied: Yes       ____________________________________________   INITIAL IMPRESSION / ASSESSMENT AND PLAN / ED COURSE  [name] was evaluated in Emergency Department on 11/24/2019 for the symptoms described in the history of present illness. She was evaluated in the context of the global COVID-19 pandemic, which necessitated consideration that the patient might be at risk for infection with the SARS-CoV-2 virus that causes COVID-19. Institutional protocols and algorithms that pertain to the evaluation of patients at risk for COVID-19 are in a state of rapid change based on information released by regulatory bodies including the CDC and federal and state organizations. These policies and algorithms were followed during  the patient's care in the ED.    Patient is a 59 year old who comes in with significant left arm swelling and pain status post dialysis ligation secondary to severe bleed from her fistula.  Discussed with Dr. Delana Meyer from vascular.  Stated that a venous ultrasound would not be beneficial for her.  Recommended a CT angiogram to further evaluate for abscess, edema, arterial issues.  Patient did not miss any dialysis but will get labs to make sure her kidney function looks good.  IV was placed but it blew.  Repeat IV was placed.  Patient handed off to oncoming team pending labs, CT scan and rediscussion with Dr. Delana Meyer   ____________________________________________   FINAL CLINICAL IMPRESSION(S) / ED DIAGNOSES   Final diagnoses:  RUQ pain  Left arm swelling      MEDICATIONS GIVEN DURING THIS VISIT:  Medications  fentaNYL (SUBLIMAZE) injection 75 mcg (75 mcg Intravenous Given 11/24/19 1433)     ED Discharge Orders    None       Note:  This document was prepared using Dragon voice recognition software and may include unintentional dictation errors.   Vanessa Anthem, MD 11/24/19 [phone]

## 2019-11-24 NOTE — ED Provider Notes (Signed)
-----------------------------------------   6:10 PM on 11/24/2019 -----------------------------------------  Dr. Delana Meyer has reviewed the CT images, given that there is a possibility of pyomyositis along with a small fluid collection we will admit the patient for IV antibiotics.  Dr. Guadlupe Spanish states he will likely attempt to drain the fluid collection tomorrow.  I spoke to the hospitalist who is agreeable to admit.   Harvest Dark, MD 11/24/19 1810

## 2019-11-24 NOTE — Progress Notes (Signed)
[facility]  Communication Note: Patient awaiting CT scan of LUE. LUE wound is healing well. No signs of infection noted. Medial / Proximal hematoma - stable in size and appearance when compared to exam on discharge. No signs of infection on exam. Forearm with some swelling. Recommend elevation.  Extremity is soft. Hand warm. No acute vascular compromise noted to extremity.  Please refer to 11/24/19 @ 15:56 photo taken in ED. Will follow up CT Most likely replace VAC - doesnt seem to need any vascular intervention at this time.   Discussed with Dr. Eber Hong Keldric Poyer PA-C 11/24/2019 4:09 PM

## 2019-11-25 ENCOUNTER — Inpatient Hospital Stay: Payer: Medicare Other | Admitting: Anesthesiology

## 2019-11-25 ENCOUNTER — Inpatient Hospital Stay: Payer: Medicare Other

## 2019-11-25 ENCOUNTER — Encounter: Payer: Self-pay | Admitting: Internal Medicine

## 2019-11-25 ENCOUNTER — Encounter
Admission: EM | Disposition: A | Discharge status: Skilled Nursing Facility | Payer: Self-pay | Source: Home / Self Care | Attending: Internal Medicine

## 2019-11-25 DIAGNOSIS — R609 Edema, unspecified: Secondary | ICD-10-CM

## 2019-11-25 DIAGNOSIS — N186 End stage renal disease: Secondary | ICD-10-CM

## 2019-11-25 DIAGNOSIS — M79606 Pain in leg, unspecified: Secondary | ICD-10-CM

## 2019-11-25 DIAGNOSIS — M7989 Other specified soft tissue disorders: Secondary | ICD-10-CM

## 2019-11-25 DIAGNOSIS — L7632 Postprocedural hematoma of skin and subcutaneous tissue following other procedure: Secondary | ICD-10-CM

## 2019-11-25 DIAGNOSIS — I97638 Postprocedural hematoma of a circulatory system organ or structure following other circulatory system procedure: Secondary | ICD-10-CM

## 2019-11-25 DIAGNOSIS — M60022 Infective myositis, left upper arm: Secondary | ICD-10-CM

## 2019-11-25 DIAGNOSIS — Z992 Dependence on renal dialysis: Secondary | ICD-10-CM

## 2019-11-25 HISTORY — PX: HEMATOMA EVACUATION: SHX5118

## 2019-11-25 HISTORY — PX: CENTRAL LINE INSERTION: CATH118232

## 2019-11-25 HISTORY — PX: APPLICATION OF WOUND VAC: SHX5189

## 2019-11-25 HISTORY — PX: I & D EXTREMITY: SHX5045

## 2019-11-25 LAB — CBC
HCT: 31.7 % — ABNORMAL LOW (ref 36.0–46.0)
Hemoglobin: 10 g/dL — ABNORMAL LOW (ref 12.0–15.0)
MCH: 27.9 pg (ref 26.0–34.0)
MCHC: 31.5 g/dL (ref 30.0–36.0)
MCV: 88.3 fL (ref 80.0–100.0)
Platelets: 212 10*3/uL (ref 150–400)
RBC: 3.59 MIL/uL — ABNORMAL LOW (ref 3.87–5.11)
RDW: 17.8 % — ABNORMAL HIGH (ref 11.5–15.5)
WBC: 8.5 10*3/uL (ref 4.0–10.5)
nRBC: 0 % (ref 0.0–0.2)

## 2019-11-25 LAB — MAGNESIUM: Magnesium: 1.8 mg/dL (ref 1.7–2.4)

## 2019-11-25 LAB — GLUCOSE, CAPILLARY
Glucose-Capillary: 109 mg/dL — ABNORMAL HIGH (ref 70–99)
Glucose-Capillary: 117 mg/dL — ABNORMAL HIGH (ref 70–99)
Glucose-Capillary: 171 mg/dL — ABNORMAL HIGH (ref 70–99)
Glucose-Capillary: 176 mg/dL — ABNORMAL HIGH (ref 70–99)
Glucose-Capillary: 65 mg/dL — ABNORMAL LOW (ref 70–99)
Glucose-Capillary: 70 mg/dL (ref 70–99)

## 2019-11-25 LAB — BASIC METABOLIC PANEL
Anion gap: 12 (ref 5–15)
BUN: 10 mg/dL (ref 6–20)
CO2: 23 mmol/L (ref 22–32)
Calcium: 7.9 mg/dL — ABNORMAL LOW (ref 8.9–10.3)
Chloride: 97 mmol/L — ABNORMAL LOW (ref 98–111)
Creatinine, Ser: 4.06 mg/dL — ABNORMAL HIGH (ref 0.44–1.00)
GFR calc Af Amer: 13 mL/min — ABNORMAL LOW (ref >60)
GFR calc non Af Amer: 11 mL/min — ABNORMAL LOW (ref >60)
Glucose, Bld: 71 mg/dL (ref 70–99)
Potassium: 3.6 mmol/L (ref 3.5–5.1)
Sodium: 132 mmol/L — ABNORMAL LOW (ref 135–145)

## 2019-11-25 LAB — PROTIME-INR
INR: 1.3 — ABNORMAL HIGH (ref 0.8–1.2)
Prothrombin Time: 16.5 seconds — ABNORMAL HIGH (ref 11.4–15.2)

## 2019-11-25 SURGERY — IRRIGATION AND DEBRIDEMENT EXTREMITY
Anesthesia: General | Site: Arm Upper

## 2019-11-25 MED ORDER — CHLORHEXIDINE GLUCONATE CLOTH 2 % EX PADS
6.0000 | MEDICATED_PAD | Freq: Every day | CUTANEOUS | Status: DC
  Administered 2019-11-26 – 2019-11-29 (×4): 6 via TOPICAL

## 2019-11-25 MED ORDER — PROPOFOL 10 MG/ML IV BOLUS
INTRAVENOUS | Status: AC
  Filled 2019-11-25: qty 20

## 2019-11-25 MED ORDER — PRO-STAT SUGAR FREE PO LIQD
30.0000 mL | Freq: Two times a day (BID) | ORAL | Status: DC
  Administered 2019-11-26: 30 mL via ORAL
  Filled 2019-11-25: qty 30

## 2019-11-25 MED ORDER — OXYCODONE HCL 5 MG PO TABS
5.0000 mg | ORAL_TABLET | ORAL | Status: DC | PRN
  Administered 2019-11-25 – 2019-11-29 (×9): 5 mg via ORAL
  Filled 2019-11-25 (×10): qty 1

## 2019-11-25 MED ORDER — MIDAZOLAM HCL 2 MG/2ML IJ SOLN
INTRAMUSCULAR | Status: DC | PRN
  Administered 2019-11-25 (×2): 1 mg via INTRAVENOUS

## 2019-11-25 MED ORDER — DEXTROSE-NACL 5-0.45 % IV SOLN: INTRAVENOUS | Status: DC

## 2019-11-25 MED ORDER — LIDOCAINE HCL (CARDIAC) PF 100 MG/5ML IV SOSY
PREFILLED_SYRINGE | INTRAVENOUS | Status: DC | PRN
  Administered 2019-11-25: 100 mg via INTRAVENOUS

## 2019-11-25 MED ORDER — SODIUM CHLORIDE FLUSH 0.9 % IV SOLN
INTRAVENOUS | Status: AC
  Filled 2019-11-25: qty 10

## 2019-11-25 MED ORDER — FENTANYL CITRATE (PF) 100 MCG/2ML IJ SOLN
25.0000 ug | INTRAMUSCULAR | Status: DC | PRN
  Administered 2019-11-25 (×4): 25 ug via INTRAVENOUS

## 2019-11-25 MED ORDER — EPOETIN ALFA 10000 UNIT/ML IJ SOLN
4000.0000 [IU] | INTRAMUSCULAR | Status: DC
  Administered 2019-11-30: 4000 [IU] via INTRAVENOUS

## 2019-11-25 MED ORDER — DEXAMETHASONE SODIUM PHOSPHATE 10 MG/ML IJ SOLN
INTRAMUSCULAR | Status: DC | PRN
  Administered 2019-11-25: 10 mg via INTRAVENOUS

## 2019-11-25 MED ORDER — CHLORHEXIDINE GLUCONATE CLOTH 2 % EX PADS: 6.0000 | MEDICATED_PAD | Freq: Every day | CUTANEOUS | Status: DC

## 2019-11-25 MED ORDER — FENTANYL CITRATE (PF) 100 MCG/2ML IJ SOLN
INTRAMUSCULAR | Status: AC
  Administered 2019-11-25: 25 ug via INTRAVENOUS
  Filled 2019-11-25: qty 2

## 2019-11-25 MED ORDER — EPHEDRINE SULFATE 50 MG/ML IJ SOLN
INTRAMUSCULAR | Status: DC | PRN
  Administered 2019-11-25 (×8): 5 mg via INTRAVENOUS

## 2019-11-25 MED ORDER — ONDANSETRON HCL 4 MG/2ML IJ SOLN: 4.0000 mg | Freq: Once | INTRAMUSCULAR | Status: DC | PRN

## 2019-11-25 MED ORDER — MIDAZOLAM HCL 2 MG/2ML IJ SOLN
INTRAMUSCULAR | Status: AC
  Filled 2019-11-25: qty 2

## 2019-11-25 MED ORDER — FENTANYL CITRATE (PF) 100 MCG/2ML IJ SOLN
INTRAMUSCULAR | Status: AC
  Filled 2019-11-25: qty 2

## 2019-11-25 MED ORDER — ASCORBIC ACID 500 MG PO TABS
500.0000 mg | ORAL_TABLET | Freq: Two times a day (BID) | ORAL | Status: DC
  Administered 2019-11-26 – 2019-11-30 (×8): 500 mg via ORAL
  Filled 2019-11-25 (×8): qty 1

## 2019-11-25 MED ORDER — GLYCOPYRROLATE 0.2 MG/ML IJ SOLN
INTRAMUSCULAR | Status: DC | PRN
  Administered 2019-11-25 (×2): .2 mg via INTRAVENOUS

## 2019-11-25 MED ORDER — ACETAMINOPHEN 10 MG/ML IV SOLN
INTRAVENOUS | Status: AC
  Filled 2019-11-25: qty 100

## 2019-11-25 MED ORDER — PROPOFOL 10 MG/ML IV BOLUS
INTRAVENOUS | Status: DC | PRN
  Administered 2019-11-25: 10 mg via INTRAVENOUS
  Administered 2019-11-25: 130 mg via INTRAVENOUS
  Administered 2019-11-25 (×4): 10 mg via INTRAVENOUS

## 2019-11-25 SURGICAL SUPPLY — 27 items
BNDG CONFORM 2 STRL LF (GAUZE/BANDAGES/DRESSINGS) IMPLANT
BNDG ELASTIC 6X5.8 VLCR STR LF (GAUZE/BANDAGES/DRESSINGS) ×5 IMPLANT
BRUSH SCRUB EZ  4% CHG (MISCELLANEOUS) ×5
BRUSH SCRUB EZ 4% CHG (MISCELLANEOUS) ×4 IMPLANT
CANISTER SUCT 1200ML W/VALVE (MISCELLANEOUS) ×5 IMPLANT
CANISTER WOUND CARE 500ML ATS (WOUND CARE) ×15 IMPLANT
COVER WAND RF STERILE (DRAPES) ×5 IMPLANT
DRAPE INCISE IOBAN 66X45 STRL (DRAPES) ×10 IMPLANT
DRSG VAC ATS MED SENSATRAC (GAUZE/BANDAGES/DRESSINGS) ×5 IMPLANT
DURAPREP 26ML APPLICATOR (WOUND CARE) ×5 IMPLANT
ELECT REM PT RETURN 9FT ADLT (ELECTROSURGICAL) ×5
ELECTRODE REM PT RTRN 9FT ADLT (ELECTROSURGICAL) ×4 IMPLANT
GAUZE SPONGE 4X4 12PLY STRL (GAUZE/BANDAGES/DRESSINGS) IMPLANT
GLOVE SURG SYN 8.0 (GLOVE) ×25 IMPLANT
GOWN STRL REUS W/ TWL LRG LVL3 (GOWN DISPOSABLE) ×8 IMPLANT
GOWN STRL REUS W/ TWL XL LVL3 (GOWN DISPOSABLE) ×4 IMPLANT
GOWN STRL REUS W/TWL LRG LVL3 (GOWN DISPOSABLE) ×10
GOWN STRL REUS W/TWL XL LVL3 (GOWN DISPOSABLE) ×5
KIT CATH CVC 3 LUMEN 7FR 8IN (MISCELLANEOUS) ×5 IMPLANT
KIT TURNOVER KIT A (KITS) ×5 IMPLANT
LABEL OR SOLS (LABEL) ×5 IMPLANT
NS IRRIG 500ML POUR BTL (IV SOLUTION) ×5 IMPLANT
PACK EXTREMITY ARMC (MISCELLANEOUS) ×5 IMPLANT
PAD NEG PRESSURE SENSATRAC (MISCELLANEOUS) ×5 IMPLANT
STOCKINETTE STRL 4IN 9604848 (GAUZE/BANDAGES/DRESSINGS) ×5 IMPLANT
SUT ETHILON 3-0 KS 30 BLK (SUTURE) ×5 IMPLANT
SWAB CULTURE AMIES ANAERIB BLU (MISCELLANEOUS) ×5 IMPLANT

## 2019-11-25 NOTE — Plan of Care (Signed)
Sleeping after meds for left arm pain.  Problem: Pain Managment: Goal: General experience of comfort will improve Outcome: Progressing

## 2019-11-25 NOTE — Op Note (Signed)
OPERATIVE NOTE   PROCEDURE: 1. Insertion of triple-lumen central venous catheter right femoral approach.  PRE-OPERATIVE DIAGNOSIS: Recurrent hematoma left arm; end-stage renal disease on hemodialysis  POST-OPERATIVE DIAGNOSIS: Same  SURGEON: Katha Cabal M.D.  ASSISTANT: Ms. Hezzie Bump  ANESTHESIA: 1% lidocaine local infiltration  ESTIMATED BLOOD LOSS: Minimal cc  INDICATIONS:   [name] is a 59 y.o. female who presents with increasing left arm pain and some swelling associated with a hematoma of the axilla associated with her recent resection of her infected dialysis graft.  She also has very poor IV access was unable to get a contrast-enhanced CT secondary to lack of IV access.  Given these findings triple-lumen catheter is being inserted and she is then undergoing evacuation of the hematoma.  DESCRIPTION: After obtaining full informed written consent, the patient was positioned supine. The right groin was prepped and draped in a sterile fashion. Ultrasound was placed in a sterile sleeve. Ultrasound was utilized to identify the right common femoral vein which is noted to be echolucent and compressible indicating patency. Images recorded for the permanent record. Under real-time visualization a Seldinger needle is inserted into the vein and the guidewires advanced without difficulty. Small counterincision was made at the wire insertion site. Dilators passed over the wire and the triple-lumen catheter is fed without difficulty.  All 3 lm aspirate and flush easily and are packed with heparin saline. Catheter secured to the skin of the right thigh with 2-0 silk. A sterile dressing is applied with Biopatch.  COMPLICATIONS: None  CONDITION: Unchanged  Katha Cabal, M.D. [facility]. Office:  [phone]   11/25/2019, 1:26 PM

## 2019-11-25 NOTE — OR Nursing (Signed)
New wound vac placed.  Disregard previous wound vac #  [id number]

## 2019-11-25 NOTE — Progress Notes (Signed)
Pharmacy Antibiotic Note  [name] is a 59 y.o. female admitted on 11/24/2019 with wound infection.  Pharmacy has been consulted for Vancomycin , Zosyn dosing.  Pt on HD every MWF.   Plan: Continue Zosyn 3.375 g IV Q12H EI   Vancomycin 2 gm IV X 1 given Continue Vancomycin 1 g IV Q MWF  Will draw 1st trough before 3rd HD session on 3/10.   Height: 5\' 5"  (165.1 cm) Weight: 186 lb 4.8 oz (84.5 kg) IBW/kg (Calculated) : 57  Temp (24hrs), Avg:97.5 F (36.4 C), Min:97 F (36.1 C), Max:97.9 F (36.6 C)  Recent Labs  Lab 11/21/19 1100 11/24/19 1437 11/25/19 0830  WBC 7.0 7.9 8.5  CREATININE 5.90* 3.49* 4.06*    Estimated Creatinine Clearance: 15.8 mL/min (A) (by C-G formula based on SCr of 4.06 mg/dL (H)).    Allergies  Allergen Reactions  . Ace Inhibitors Swelling and Anaphylaxis  . Ativan [Lorazepam] Other (See Comments)    Reaction:Hallucinations and headaches  . Compazine [Prochlorperazine Edisylate] Anaphylaxis, Nausea And Vomiting and Other (See Comments)    Other reaction(s): dystonia from this vs. Reglan, 23 Jul - patient relates that she takes promethazine frequently with no problems  . Sumatriptan Succinate Other (See Comments)    Other reaction(s): delirium and hallucinations per [facility] records  . Dilaudid [Hydromorphone Hcl] Other (See Comments)    Delirium   . Ondansetron Other (See Comments)    hallucinations    . Zofran [Ondansetron Hcl] Other (See Comments)    Reaction:  hallucinations   . Dilaudid [Hydromorphone]   . Zofran [Ondansetron]     Per pt. she is allergic to zofran or will experience adverse reaction like hallucination   . Codeine Nausea And Vomiting  . Gabapentin Other (See Comments)    Reaction:  Unknown    . [place] Nausea And Vomiting  . Losartan Nausea Only  . Oxycodone Anxiety  . Prochlorperazine Other (See Comments)    Reaction:  Unknown . Patient does not remember reaction but she does have vertigo and anxiety along with n and v  at times. Could be used to treat any of these   . Reglan [Metoclopramide] Other (See Comments)    Per patient her Dr. Evelina Bucy her off it   . Scopolamine Other (See Comments)    Dizziness, also has vertigo already  . Tape Rash  . Tapentadol Rash    Antimicrobials this admission: Vanc/Zosyn 3/4 >>  Dose adjustments this admission:   Microbiology results: 3/5 WCx (OR) collected 3/4 BCx x2 NGTD MRSA PCR neg  Thank you for allowing pharmacy to be a part of this patient's care.  Rocky Morel 11/25/2019 12:38 PM

## 2019-11-25 NOTE — Anesthesia Preprocedure Evaluation (Addendum)
Anesthesia Evaluation  Patient identified by MRN, date of birth, ID band Patient awake    Reviewed: Allergy & Precautions, NPO status , Patient's Chart, lab work & pertinent test results  History of Anesthesia Complications Negative for: history of anesthetic complications  Airway Mallampati: III       Dental   Pulmonary asthma , neg sleep apnea, COPD,  COPD inhaler,           Cardiovascular hypertension, + angina + Past MI, + Peripheral Vascular Disease and +CHF  (-) Valvular Problems/Murmurs     Neuro/Psych neg Seizures Anxiety Depression TIA   GI/Hepatic Neg liver ROS, hiatal hernia, GERD  Medicated and Controlled,  Endo/Other  diabetes, Type 2, Oral Hypoglycemic Agents  Renal/GU Dialysis and ESRFRenal disease     Musculoskeletal   Abdominal   Peds  Hematology  (+) anemia ,   Anesthesia Other Findings   Reproductive/Obstetrics                                                             Anesthesia Evaluation  Patient identified by MRN, date of birth, ID band Patient awake    Reviewed: Allergy & Precautions, H&P , NPO status , Patient's Chart, lab work & pertinent test results  History of Anesthesia Complications Negative for: history of anesthetic complications  Airway Mallampati: III  TM Distance: >3 FB Neck ROM: limited    Dental  (+) Poor Dentition, Upper Dentures, Lower Dentures   Pulmonary neg shortness of breath, COPD,           Cardiovascular + CAD and + Cardiac Stents  (-) Past MI      Neuro/Psych CVA negative psych ROS   GI/Hepatic negative GI ROS, Neg liver ROS, neg GERD  ,  Endo/Other  negative endocrine ROS  Renal/GU DialysisRenal disease     Musculoskeletal   Abdominal   Peds  Hematology  (+) Blood dyscrasia, anemia ,   Anesthesia Other Findings   Reproductive/Obstetrics negative OB ROS                            Anesthesia Physical Anesthesia Plan  ASA: V and emergent  Anesthesia Plan: General ETT and Rapid Sequence   Post-op Pain Management:    Induction: Intravenous  PONV Risk Score and Plan: Ondansetron, Dexamethasone, Midazolam and Treatment may vary due to age or medical condition  Airway Management Planned: Oral ETT and Video Laryngoscope Planned  Additional Equipment:   Intra-op Plan:   Post-operative Plan: Extubation in OR and Possible Post-op intubation/ventilation  Informed Consent: I have reviewed the patients History and Physical, chart, labs and discussed the procedure including the risks, benefits and alternatives for the proposed anesthesia with the patient or authorized representative who has indicated his/her understanding and acceptance.     Dental Advisory Given  Plan Discussed with: Anesthesiologist, CRNA and Surgeon  Anesthesia Plan Comments: (Actively bleeding brought directly from ED to PreOp, History and consent from the patients son  Son consented for risks of anesthesia including but not limited to:  - adverse reactions to medications - damage to teeth, lips or other oral mucosa - sore throat or hoarseness - Damage to heart, brain, lungs or loss of life  He voiced understanding.)  Anesthesia Quick Evaluation  Anesthesia Physical Anesthesia Plan  ASA: IV  Anesthesia Plan: General   Post-op Pain Management:    Induction: Intravenous  PONV Risk Score and Plan: 3 and Ondansetron, Dexamethasone and Midazolam  Airway Management Planned: LMA  Additional Equipment:   Intra-op Plan:   Post-operative Plan:   Informed Consent: I have reviewed the patients History and Physical, chart, labs and discussed the procedure including the risks, benefits and alternatives for the proposed anesthesia with the patient or authorized representative who has indicated his/her understanding and acceptance.       Plan Discussed with:    Anesthesia Plan Comments:         Anesthesia Quick Evaluation

## 2019-11-25 NOTE — Progress Notes (Signed)
Hemodialysis patient known at [facility] MWF 11:00am. Patient was recently discharged to [facility]. Please contact me with any dialysis placement concerns.

## 2019-11-25 NOTE — Anesthesia Procedure Notes (Signed)
Procedure Name: LMA Insertion Performed by: Kelton Pillar, CRNA Pre-anesthesia Checklist: Patient identified, Emergency Drugs available, Suction available and Patient being monitored Patient Re-evaluated:Patient Re-evaluated prior to induction Oxygen Delivery Method: Circle system utilized Preoxygenation: Pre-oxygenation with 100% oxygen Induction Type: IV induction Ventilation: Mask ventilation without difficulty LMA: LMA inserted LMA Size: 4.0 Number of attempts: 1 Placement Confirmation: CO2 detector,  positive ETCO2 and breath sounds checked- equal and bilateral Tube secured with: Tape Dental Injury: Teeth and Oropharynx as per pre-operative assessment

## 2019-11-25 NOTE — Progress Notes (Signed)
HD Tx Completed w/out complications   16/10/96 2205  Vital Signs  Pulse Rate 66  Resp 11  BP (!) 169/60  Oxygen Therapy  SpO2 99 %  O2 Device Room Air  During Hemodialysis Assessment  Blood Flow Rate (mL/min) 300 mL/min  Arterial Pressure (mmHg) -130 mmHg  Venous Pressure (mmHg) 90 mmHg  Transmembrane Pressure (mmHg) 50 mmHg  Ultrafiltration Rate (mL/min) 330 mL/min  Dialysate Flow Rate (mL/min) 500 ml/min  Conductivity: Machine  14.1  HD Safety Checks Performed Yes  Intra-Hemodialysis Comments Tx completed;Tolerated well

## 2019-11-25 NOTE — Progress Notes (Signed)
[facility], [place] 11/25/19  Subjective:   LOS: 1 03/04 0701 - 03/05 0700 In: 467 [I.V.:16.9; IV Piggyback:450] Out: -   Last HD was   Objective:  Vital signs in last 24 hours:  Temp:  [97 F (36.1 C)-97.8 F (36.6 C)] 97.8 F (36.6 C) (03/05 1305) Pulse Rate:  [46-57] 52 (03/05 1350) Resp:  [11-27] 14 (03/05 1350) BP: (145-176)/(49-74) 154/70 (03/05 1350) SpO2:  [95 %-100 %] 100 % (03/05 1350) Weight:  [83.6 kg-84.5 kg] 84.5 kg (03/05 0542)  Weight change:  Filed Weights   11/24/19 1243 11/24/19 2042 11/25/19 0542  Weight: 81.6 kg 83.6 kg 84.5 kg    Intake/Output:    Intake/Output Summary (Last 24 hours) at 11/25/2019 1628 Last data filed at 11/25/2019 1300 Gross per 24 hour  Intake 1016.97 ml  Output 25 ml  Net 991.97 ml     Physical Exam: General:   HEENT   Pulm/lungs   CVS/Heart   Abdomen:    Extremities:   Neurologic:   Skin:   Access:        Basic Metabolic Panel:  Recent Labs  Lab 11/21/19 1100 11/24/19 1437 11/25/19 0830  NA 133* 131* 132*  K 3.6 3.3* 3.6  CL 90* 95* 97*  CO2 33* 29 23  GLUCOSE 89 106* 71  BUN 15 5* 10  CREATININE 5.90* 3.49* 4.06*  CALCIUM 8.3* 8.1* 7.9*  MG  --   --  1.8     CBC: Recent Labs  Lab 11/21/19 1100 11/24/19 1437 11/25/19 0830  WBC 7.0 7.9 8.5  NEUTROABS 3.9 3.6  --   HGB 10.4* 10.2* 10.0*  HCT 32.4* 32.1* 31.7*  MCV 85.3 87.5 88.3  PLT 247 209 212      Lab Results  Component Value Date   HEPBSAG Negative (A) 11/13/2019   HEPBIGM  06/12/2010    NEGATIVE (NOTE) High levels of Hepatitis B Core IgM antibody are detectable during the acute stage of Hepatitis B. This antibody is used to differentiate current from past HBV infection.       Microbiology:  Recent Results (from the past 240 hour(s))  SARS CORONAVIRUS 2 (TAT 6-24 HRS) Nasopharyngeal Nasopharyngeal Swab     Status: None   Collection Time: 11/17/19  6:22 PM   Specimen: Nasopharyngeal Swab   Result Value Ref Range Status   SARS Coronavirus 2 NEGATIVE NEGATIVE Final    Comment: (NOTE) SARS-CoV-2 target nucleic acids are NOT DETECTED. The SARS-CoV-2 RNA is generally detectable in upper and lower respiratory specimens during the acute phase of infection. Negative results do not preclude SARS-CoV-2 infection, do not rule out co-infections with other pathogens, and should not be used as the sole basis for treatment or other patient management decisions. Negative results must be combined with clinical observations, patient history, and epidemiological information. The expected result is Negative. Fact Sheet for Patients: SugarRoll.be Fact Sheet for Healthcare Providers: https://www.woods-mathews.com/ This test is not yet approved or cleared by the [place] FDA and  has been authorized for detection and/or diagnosis of SARS-CoV-2 by FDA under an Emergency Use Authorization (EUA). This EUA will remain  in effect (meaning this test can be used) for the duration of the COVID-19 declaration under Section 56 4(b)(1) of the Act, 21 U.S.C. section 360bbb-3(b)(1), unless the authorization is terminated or revoked sooner. Performed at [facility], [place] [address], [place], [place] [address]   Respiratory Panel by RT PCR (Flu A&B, Covid) - Nasopharyngeal  Swab     Status: None   Collection Time: 11/24/19  6:19 PM   Specimen: Nasopharyngeal Swab  Result Value Ref Range Status   SARS Coronavirus 2 by RT PCR NEGATIVE NEGATIVE Final    Comment: (NOTE) SARS-CoV-2 target nucleic acids are NOT DETECTED. The SARS-CoV-2 RNA is generally detectable in upper respiratoy specimens during the acute phase of infection. The lowest concentration of SARS-CoV-2 viral copies this assay can detect is 131 copies/mL. A negative result does not preclude SARS-Cov-2 infection and should not be used as the sole basis for treatment or other patient management  decisions. A negative result may occur with  improper specimen collection/handling, submission of specimen other than nasopharyngeal swab, presence of viral mutation(s) within the areas targeted by this assay, and inadequate number of viral copies (<131 copies/mL). A negative result must be combined with clinical observations, patient history, and epidemiological information. The expected result is Negative. Fact Sheet for Patients:  PinkCheek.be Fact Sheet for Healthcare Providers:  GravelBags.it This test is not yet ap proved or cleared by the [place] FDA and  has been authorized for detection and/or diagnosis of SARS-CoV-2 by FDA under an Emergency Use Authorization (EUA). This EUA will remain  in effect (meaning this test can be used) for the duration of the COVID-19 declaration under Section 564(b)(1) of the Act, 21 U.S.C. section 360bbb-3(b)(1), unless the authorization is terminated or revoked sooner.    Influenza A by PCR NEGATIVE NEGATIVE Final   Influenza B by PCR NEGATIVE NEGATIVE Final    Comment: (NOTE) The Xpert Xpress SARS-CoV-2/FLU/RSV assay is intended as an aid in  the diagnosis of influenza from Nasopharyngeal swab specimens and  should not be used as a sole basis for treatment. Nasal washings and  aspirates are unacceptable for Xpert Xpress SARS-CoV-2/FLU/RSV  testing. Fact Sheet for Patients: PinkCheek.be Fact Sheet for Healthcare Providers: GravelBags.it This test is not yet approved or cleared by the [place] FDA and  has been authorized for detection and/or diagnosis of SARS-CoV-2 by  FDA under an Emergency Use Authorization (EUA). This EUA will remain  in effect (meaning this test can be used) for the duration of the  Covid-19 declaration under Section 564(b)(1) of the Act, 21  U.S.C. section 360bbb-3(b)(1), unless the authorization  is  terminated or revoked. Performed at [facility], [address] [place], [place] [address]   Blood culture (routine x 2)     Status: None (Preliminary result)   Collection Time: 11/24/19  7:46 PM   Specimen: BLOOD  Result Value Ref Range Status   Specimen Description BLOOD LEFT JUGULAR  Final   Special Requests   Final    BOTTLES DRAWN AEROBIC AND ANAEROBIC Blood Culture results may not be optimal due to an inadequate volume of blood received in culture bottles   Culture   Final    NO GROWTH < 12 HOURS Performed at [facility], [address] [place], [place] [address]    Report Status PENDING  Incomplete  Blood culture (routine x 2)     Status: None (Preliminary result)   Collection Time: 11/24/19  7:46 PM   Specimen: BLOOD  Result Value Ref Range Status   Specimen Description BLOOD LEFT JUGULAR  Final   Special Requests   Final    BOTTLES DRAWN AEROBIC AND ANAEROBIC Blood Culture adequate volume   Culture   Final    NO GROWTH < 12 HOURS Performed at [facility], [address] [place], [place] [address]    Report Status PENDING  Incomplete  MRSA PCR Screening     Status: None   Collection Time: 11/24/19  9:02 PM   Specimen: Nasal Mucosa; Nasopharyngeal  Result Value Ref Range Status   MRSA by PCR NEGATIVE NEGATIVE Final    Comment:        The GeneXpert MRSA Assay (FDA approved for NASAL specimens only), is one component of a comprehensive MRSA colonization surveillance program. It is not intended to diagnose MRSA infection nor to guide or monitor treatment for MRSA infections. Performed at [facility], [address] [place], [place] [address]   Aerobic/Anaerobic Culture (surgical/deep wound)     Status: None (Preliminary result)   Collection Time: 11/25/19 11:08 AM   Specimen: PATH Other; Tissue  Result Value Ref Range Status   Specimen Description   Final    WOUND LEFT AXILLA Performed at [facility], [address], [place], [place] [address]    Special Requests   Final    NONE Performed at [facility], [address] [place], [place] [address]    Gram Stain PENDING  Incomplete   Culture PENDING  Incomplete   Report Status PENDING  Incomplete    Coagulation Studies: Recent Labs    11/24/19 1437 11/25/19 0830  LABPROT 14.9 16.5*  INR 1.2 1.3*    Urinalysis: No results for input(s): COLORURINE, LABSPEC, PHURINE, GLUCOSEU, HGBUR, BILIRUBINUR, KETONESUR, PROTEINUR, UROBILINOGEN, NITRITE, LEUKOCYTESUR in the last 72 hours.  Invalid input(s): APPERANCEUR    Imaging: CT HUMERUS LEFT WO CONTRAST  Result Date: 11/24/2019 CLINICAL DATA:  End-stage renal disease on hemodialysis with soft tissue swelling involving the left upper extremity. Patient had an infected left arm brachial artery AV graft EXAM: CT OF THE UPPER LEFT EXTREMITY WITHOUT CONTRAST TECHNIQUE: Multidetector CT imaging of the upper left extremity was performed according to the standard protocol. COMPARISON:  None. FINDINGS: There is a moderate size subcutaneous hematoma noted in the anterior aspect of the left upper arm. This measures 4.5 x 3.0 cm. There is a small hematoma just below this. Open wound is noted along the anterior aspect of the upper arm. There is diffuse subcutaneous soft tissue swelling/edema/fluid involving the entire left upper extremity. I do not see a discrete drainable soft tissue abscess. Suspect diffuse myofasciitis without definite findings for pyomyositis. No findings suspicious for septic arthritis or osteomyelitis. Suspect remote distal radius fracture with volar impaction. IMPRESSION: 1. Diffuse subcutaneous soft tissue swelling/edema/fluid involving the entire left upper extremity without discrete drainable soft tissue abscess. 2. Diffuse myofasciitis without definite findings for pyomyositis. 3. Moderate-sized subcutaneous hematoma involving the anterior aspect of the upper arm.  Electronically Signed   By: Marijo Sanes M.D.   On: 11/24/2019 16:57   CT FOREARM LEFT WO CONTRAST  Result Date: 11/24/2019 CLINICAL DATA:  End-stage renal disease on hemodialysis with soft tissue swelling involving the left upper extremity. Patient had an infected left arm brachial artery AV graft EXAM: CT OF THE UPPER LEFT EXTREMITY WITHOUT CONTRAST TECHNIQUE: Multidetector CT imaging of the upper left extremity was performed according to the standard protocol. COMPARISON:  None. FINDINGS: There is a moderate size subcutaneous hematoma noted in the anterior aspect of the left upper arm. This measures 4.5 x 3.0 cm. There is a small hematoma just below this. Open wound is noted along the anterior aspect of the upper arm. There is diffuse subcutaneous soft tissue swelling/edema/fluid involving the entire left upper  extremity. I do not see a discrete drainable soft tissue abscess. Suspect diffuse myofasciitis without definite findings for pyomyositis. No findings suspicious for septic arthritis or osteomyelitis. Suspect remote distal radius fracture with volar impaction. IMPRESSION: 1. Diffuse subcutaneous soft tissue swelling/edema/fluid involving the entire left upper extremity without discrete drainable soft tissue abscess. 2. Diffuse myofasciitis without definite findings for pyomyositis. 3. Moderate-sized subcutaneous hematoma involving the anterior aspect of the upper arm. Electronically Signed   By: Marijo Sanes M.D.   On: 11/24/2019 16:57   CT HAND LEFT WO CONTRAST  Result Date: 11/24/2019 CLINICAL DATA:  End-stage renal disease on hemodialysis with soft tissue swelling involving the left upper extremity. Patient had an infected left arm brachial artery AV graft EXAM: CT OF THE UPPER LEFT EXTREMITY WITHOUT CONTRAST TECHNIQUE: Multidetector CT imaging of the upper left extremity was performed according to the standard protocol. COMPARISON:  None. FINDINGS: There is a moderate size subcutaneous hematoma  noted in the anterior aspect of the left upper arm. This measures 4.5 x 3.0 cm. There is a small hematoma just below this. Open wound is noted along the anterior aspect of the upper arm. There is diffuse subcutaneous soft tissue swelling/edema/fluid involving the entire left upper extremity. I do not see a discrete drainable soft tissue abscess. Suspect diffuse myofasciitis without definite findings for pyomyositis. No findings suspicious for septic arthritis or osteomyelitis. Suspect remote distal radius fracture with volar impaction. IMPRESSION: 1. Diffuse subcutaneous soft tissue swelling/edema/fluid involving the entire left upper extremity without discrete drainable soft tissue abscess. 2. Diffuse myofasciitis without definite findings for pyomyositis. 3. Moderate-sized subcutaneous hematoma involving the anterior aspect of the upper arm. Electronically Signed   By: Marijo Sanes M.D.   On: 11/24/2019 16:57     Medications:   . sodium chloride Stopped (11/24/19 2207)  . piperacillin-tazobactam (ZOSYN)  IV Stopped (11/25/19 0207)  . vancomycin     . amLODipine  10 mg Oral Daily  . [START ON 11/26/2019] vitamin C  500 mg Oral BID  . atorvastatin  20 mg Oral Daily  . carvedilol  6.25 mg Oral BID  . citalopram  20 mg Oral Daily  . [START ON 11/26/2019] feeding supplement (PRO-STAT SUGAR FREE 64)  30 mL Oral BID  . fentaNYL      . FLUoxetine  20 mg Oral QHS  . fluticasone  2 spray Each Nare Daily  . gabapentin  100 mg Oral BID  . gabapentin  200 mg Oral QHS  . hydrALAZINE  10 mg Oral TID  . insulin aspart  0-6 Units Subcutaneous TID WC  . lipase/protease/amylase  36,000 Units Oral TID WC  . meclizine  12.5 mg Oral BID  . multivitamin  1 tablet Oral Daily  . pantoprazole  40 mg Oral Daily  . sevelamer carbonate  2,400 mg Oral TID WC  . sodium chloride flush  3 mL Intravenous Q12H  . sodium chloride flush      . cyanocobalamin  1,000 mcg Oral Daily   sodium chloride, acetaminophen **OR**  acetaminophen, albuterol, hydrALAZINE, LORazepam, nitroGLYCERIN, oxyCODONE  Assessment/ Plan:  59 y.o. female with end stage renal disease on hemodialysis for last fifteen years, pancreatic insufficiency, hypertension, peripheral vascular disease, GERD, diabetes mellitus type II, COPD  Active Problems:   Hematoma of arm, left, subsequent encounter  [facility] MWF [facility]   #. ESRD We will arrange for hemodialysis Monday, Wednesday, Friday shift Patient had recent rupture of AV graft.  Required ligation and removal of  graft. Underwent irrigation and debridement of hematoma and insertion of wound VAC Try to draw labs at dialysis as much as possible and avoid peripheral sticks  #. Anemia of CKD  Lab Results  Component Value Date   HGB 10.0 (L) 11/25/2019   Low dose EPO with HD  #. SHPTH     Component Value Date/Time   PTH 357 (H) 10/05/2018 1241   Lab Results  Component Value Date   PHOS 2.2 (L) 11/18/2019   Monitor calcium and phos level during this admission  #. Diabetes type 2 with CKD Hemoglobin A1C (%)  Date Value  07/19/2013 9.3 (H)   Hgb A1c MFr Bld (%)  Date Value  11/15/2019 4.7 (L)     LOS: [place] 3/5/20214:28 PM  [facility] [place], [place]

## 2019-11-25 NOTE — Progress Notes (Signed)
Pre HD Tx Assessment   11/25/19 1905  Neurological  Level of Consciousness Alert  Orientation Level Oriented X4  Respiratory  Respiratory Pattern Regular;Unlabored  Chest Assessment Chest expansion symmetrical  Bilateral Breath Sounds Clear;Diminished  Cardiac  Pulse Regular  Heart Sounds S1, S2  Jugular Venous Distention (JVD) No  ECG Monitor Yes  Cardiac Rhythm SB  Antiarrhythmic device No  Vascular  R Radial Pulse +2  L Radial Pulse +1  Integumentary  Integumentary (WDL) X  Skin Color Appropriate for ethnicity  Skin Condition Dry  Skin Integrity Surgical Incision (see LDA)  Musculoskeletal  Musculoskeletal (WDL) X  Generalized Weakness Yes  Gastrointestinal  Bowel Sounds Assessment Hypoactive  Last BM Date  (Pt unable to tell)  GU Assessment  Genitourinary (WDL) X  Genitourinary Symptoms Oliguria (ESRD)  Psychosocial  Psychosocial (WDL) WDL  Incision (Closed) 11/25/19 Arm Left  Date First Assessed/Time First Assessed: 11/25/19 1121   Location: Arm  Location Orientation: Left  Dressing Type  (see woundvac)  Negative Pressure Wound Therapy Arm Left;Upper  Placement Date/Time: 11/25/19 1118   Wound Type: Surgical (Open wound)  Location: Arm  Location Orientation: Left;Upper  Last dressing change 11/25/19  Site / Wound Assessment Clean;Dry  Peri-wound Assessment Intact  Cycle Continuous  Target Pressure (mmHg) 125  Drainage Amount Minimal  Drainage Description Serosanguineous

## 2019-11-25 NOTE — Progress Notes (Addendum)
Post HD Tx Note  Pt tolerated well the Tx and received 1G of Vancomycin in 271ml as prescribed. Pt continues to be on RA SPO2 100%. Pt reports no chest pain or SOB. Pt tx run for 3 hrs on 3K2.5Ca. Pt continues to have her left arm wrapped and wound vac connected to the left arm wound    11/25/19 2215  Hand-Off documentation  Report received from (Full Name) Newt Minion RN   Vital Signs  Temp 98.6 F (37 C)  Temp Source Oral  Pulse Rate 71  Pulse Rate Source Monitor  Resp 12  BP (!) 175/59  BP Location Right Arm  BP Method Automatic  Patient Position (if appropriate) Lying  Oxygen Therapy  SpO2 99 %  O2 Device Room Air  Post-Hemodialysis Assessment  Rinseback Volume (mL) 250 mL  KECN 55.5 V  Dialyzer Clearance Lightly streaked  Duration of HD Treatment -hour(s) 3 hour(s)  Hemodialysis Intake (mL) 500 mL  UF Total -Machine (mL) 1505 mL  Net UF (mL) 1005 mL  Tolerated HD Treatment Yes  Hemodialysis Catheter Right Subclavian  Placement Date/Time: 11/14/19 1648   Time Out: Correct patient;Correct site;Correct procedure  Maximum sterile barrier precautions: Hand hygiene;Cap;Mask;Sterile gown;Sterile gloves;Large sterile sheet  Site Prep: Chlorhexidine (preferred)  Local Anes...  Site Condition No complications  Blue Lumen Status Heparin locked  Red Lumen Status Heparin locked  Purple Lumen Status N/A  Catheter fill solution Heparin 1000 units/ml  Catheter fill volume (Arterial) 1.6 cc  Catheter fill volume (Venous) 1.6  Dressing Type Biopatch  Dressing Status Dressing changed;Antimicrobial disc changed  Interventions New dressing;Dressing changed  Drainage Description None  Dressing Change Due 11/29/19  Post treatment catheter status Capped and Clamped

## 2019-11-25 NOTE — Consult Note (Addendum)
WOC consult requested for left arm wound prior to vascular team involvement.  They are now following for assessment and plan of care. Their progress notes indicate: Based on CT scan results possible hematoma / abscess / fluid collection in LUE;  plan for I&D and washout in OR in the AM.  Please refer to the vascular team for further questions.  Please re-consult if further assistance is needed.  Thank-you,  Julien Girt MSN, [place], [place], [place], [place]

## 2019-11-25 NOTE — OR Nursing (Signed)
Wound vac applied to left arm. Wound vac # [id number]

## 2019-11-25 NOTE — Progress Notes (Signed)
Initial Nutrition Assessment  DOCUMENTATION CODES:   Obesity unspecified  INTERVENTION:   Prostat liquid protein PO 30 ml BID with meals, each supplement provides 100 kcal, 15 grams protein.  Rena-vite daily   Vitamin C 500mg  po BID  NUTRITION DIAGNOSIS:   Increased nutrient needs related to chronic illness(ESRD on HD, COPD) as evidenced by increased estimated needs.  GOAL:   Patient will meet greater than or equal to 90% of their needs  MONITOR:   PO intake, Supplement acceptance, Weight trends, Skin, I & O's  REASON FOR ASSESSMENT:   Malnutrition Screening Tool    ASSESSMENT:   59 year old female with PMHx of asthma, DM type 2, HTN, CAD, diabetic neuropathy, diabetic gastroparesis, ESRD on HD, GERD, pancreatitis/pancreatic insufficiency, hx MI, mitral regurgitation, COPD, depression, anxiety, diverticulosis, hx of hiatal hernia and PNA who is admitted with significant left arm swelling and pain status post dialysis ligation secondary to severe bleed from her fistula.  Unable to see patient today as pt in procedure at time of RD visit. Pt is well known to the nutrition department from multiple previous admits. Pt with fairly good appetite and oral intake at baseline. Pt does not like supplements as she reports they give her diarrhea. Pt currently NPO for I & D today. RD will add vitamins and prostat to help pt meet her estimated needs and support losses from HD. Per chart, pt is fairly weight stable at baseline. RD will obtain nutrition related history and exam at follow-up.   Medications reviewed and include: celexa, insulin, creon, meclizine, rena-vite, protonix, renvela, B12, 5% dextrose in NaCl @100ml /hr, zosyn, vancomycin   Labs reviewed: Na 132(L), creat 4.06(H), Mg 1.8 wnl Hgb 10.0(L), Hct 31.7(L) cbgs- 70, 65, 109 x 24 hrs AIC 4.7(L)- 2/23 iPTH 357(H)- 1/14  Unable to complete Nutrition-Focused physical exam at this time.   Diet Order:   Diet Order            Diet NPO time specified  Diet effective midnight             EDUCATION NEEDS:   Not appropriate for education at this time  Skin:  Skin Assessment: Reviewed RN Assessment(hematoma L arm)  Last BM:  pta  Height:   Ht Readings from Last 1 Encounters:  11/24/19 5\' 5"  (1.651 m)    Weight:   Wt Readings from Last 1 Encounters:  11/25/19 84.5 kg    Ideal Body Weight:  56.8 kg  BMI:  Body mass index is 31 kg/m.  Estimated Nutritional Needs:   Kcal:  1800-2100kcal/day  Protein:  90-105g/day  Fluid:  UOP +1L  Koleen Distance MS, RD, LDN Contact information available in Amion

## 2019-11-25 NOTE — Op Note (Signed)
    OPERATIVE NOTE   PROCEDURE: Incision drainage left axillary hematoma  PRE-OPERATIVE DIAGNOSIS: Symptomatic left axillary hematoma  POST-OPERATIVE DIAGNOSIS: Same  SURGEON: Hortencia Pilar  ASSISTANT(S): Ms. Hezzie Bump  ANESTHESIA: general  ESTIMATED BLOOD LOSS: 30 cc  FINDING(S): Hematoma no purulence was encountered.  Deep wound culture was made.    SPECIMEN(S): Hematoma  INDICATIONS:   [name] is a 59 y.o. female who presents with increasing left arm pain and some swelling associated with a hematoma of the axilla associated with her recent resection of her infected dialysis graft.  She also has very poor IV access was unable to get a contrast-enhanced CT secondary to lack of IV access.  Given these findings triple-lumen catheter is being inserted and she is then undergoing evacuation of the hematoma. .  DESCRIPTION: After full informed written consent was obtained from the patient, the patient was brought back to the operating room and placed supine upon the operating table.  Prior to induction, the patient received IV antibiotics.   After obtaining adequate anesthesia, the patient was then prepped and draped in the standard fashion for a evacuation of the axillary hematoma.  Curvilinear incision is made along the course of the previously excised graft and carried down over the palpable hematoma.  Bovie cautery is used can to control bleeding in the subcutaneous tissues.  The hematoma cavity is then entered and normal-appearing thrombus is encountered.  No evidence of purulence.  This is evacuated both digitally as well as with irrigation.  Deep wound culture is then made and the specimen sent for aerobic anaerobic.  The material removed from the cavity is then sent for specimen.  The wound in the axilla as well as the arm open wound are then connected to small skin bridge is excised to connect the 2 and a VAC dressing is placed.  Unfortunately the VAC filled with blood  requiring it to be removed and a bleeding vessel was identified in the open mid arm wound which was odd because we had not treated that area or been working in that area.  Nevertheless this was controlled with a figure-of-eight 3-0 nylon.  The VAC was replaced.   The patient tolerated this procedure well and remained hemodynamically stable throughout the procedure.   COMPLICATIONS: None  CONDITION: [name]  Office: [phone]   11/25/2019, 1:30 PM

## 2019-11-25 NOTE — Progress Notes (Signed)
Post HD Tx Assessment   11/25/19 2215  Neurological  Level of Consciousness Alert  Orientation Level Oriented X4  Respiratory  Respiratory Pattern Regular;Unlabored  Chest Assessment Chest expansion symmetrical  Bilateral Breath Sounds Clear;Diminished  Cardiac  Pulse Regular  Heart Sounds S1, S2  Jugular Venous Distention (JVD) No  ECG Monitor Yes  Cardiac Rhythm SB  Antiarrhythmic device No  Vascular  R Radial Pulse +2  L Radial Pulse +1  Integumentary  Integumentary (WDL) X  Skin Color Appropriate for ethnicity  Skin Condition Dry  Skin Integrity Surgical Incision (see LDA)  Musculoskeletal  Musculoskeletal (WDL) X  Generalized Weakness Yes  Gastrointestinal  Bowel Sounds Assessment Hypoactive  GU Assessment  Genitourinary (WDL) X  Genitourinary Symptoms Oliguria (ESRD)  Psychosocial  Psychosocial (WDL) WDL  Incision (Closed) 11/25/19 Arm Left  Date First Assessed/Time First Assessed: 11/25/19 1121   Location: Arm  Location Orientation: Left  Dressing Type  (see woundvac)  Negative Pressure Wound Therapy Arm Left;Upper  Placement Date/Time: 11/25/19 1118   Wound Type: Surgical (Open wound)  Location: Arm  Location Orientation: Left;Upper  Last dressing change 11/25/19  Site / Wound Assessment Clean;Dry  Peri-wound Assessment Intact  Cycle Continuous  Target Pressure (mmHg) 125  Drainage Amount Minimal  Drainage Description Serosanguineous

## 2019-11-25 NOTE — Progress Notes (Addendum)
Progress Note    [name]  YQM:[id number] DOB: 08-Jun-1961  DOA: 11/24/2019 PCP: Freddy Finner, NP      Brief Narrative:    Medical records reviewed and are as summarized below:  [name] is an 59 y.o. female with medical history significant for end stage renal disease on hemodialysis for last fifteen years,MWF, pancreatic insufficiency,chronic essential hypertension, peripheral vascular disease, GERD, diabetes mellitus type II, anemia of chronic kidney disease and COPD. Patient was recently on admission and discharged on 02/25 on account of hemorraghic shock as a result  Infected left AV fistula graft.Patient underwent emergent graft removal and currently has a perm cath on right anterior chest for dialysis. She is currently at the nursing home and receives wound dressing daily and has a wound vac.She returns to the ED on account of complaints of worsening left upper extremity swelling and pain, worse over the past 24 hours. Denied any associated tingling sensation or loss of sensation. Admits to subjective chills but denies fever , nausea or vomiting.Denies any bleeding however from site of wound.she admits to being compliant with her medications including ASA and plavix.      Assessment/Plan:   Active Problems:   End-stage renal disease on hemodialysis (HCC)   Hematoma of arm, left, subsequent encounter   1. Left arm swelling/left upper extremity wound/left axillary hematoma:  S/P incision and drainage of left axillary hematoma by vascular surgeon on 11/25/2019.  Continue empiric antibiotics and local wound care  2. ESRD on HD MWF: Right femoral triple-lumen catheter was placed by vascular surgeon today for hemodialysis.  Follow-up with nephrologist.  3. Chronic Essential Hypertension.  Continue antihypertensives.  4. Anemia of chronic kidney disease . Hemoglobin remains stable. Continue with CBC monitoring daily.  Aspirin and Plavix on hold.  5. Secondary  hyperparathyroidism: Continue with home dose of sevelamer   6. History of bradycardia: Maintain on telemetry monitoring over the next 24 hours.         Body mass index is 31 kg/m.  (Obesity)   Family Communication/Anticipated D/C date and plan/Code Status   DVT prophylaxis: SCDs Code Status: Full code Family Communication: Plan discussed with patient Disposition Plan: Patient is from SNF and plan to discharge to SNF when cleared by vascular surgeon.      Subjective:   C/o pain in the left arm  Objective:    Vitals:   11/25/19 1255 11/25/19 1305 11/25/19 1321 11/25/19 1350  BP:  (!) 149/68 (!) 149/71 (!) 154/70  Pulse: (!) 52 (!) 53 (!) 52 (!) 52  Resp: 12 14 14 14   Temp:  97.8 F (36.6 C)    TempSrc:      SpO2: 99% 95% 95% 100%  Weight:      Height:        Intake/Output Summary (Last 24 hours) at 11/25/2019 1638 Last data filed at 11/25/2019 1300 Gross per 24 hour  Intake 1016.97 ml  Output 25 ml  Net 991.97 ml   Filed Weights   11/24/19 1243 11/24/19 2042 11/25/19 0542  Weight: 81.6 kg 83.6 kg 84.5 kg    Exam:  GEN: NAD SKIN: Wound on left arm with dressing. EYES: EOMI ENT: MMM CV: RRR PULM: CTA B ABD: soft, ND, NT, +BS CNS: AAO x 3, non focal EXT: Left arm swelling and tenderness   Data Reviewed:   I have personally reviewed following labs and imaging studies:  Labs: Labs show the following:   Basic Metabolic Panel:  Recent Labs  Lab 11/21/19 1100 11/21/19 1100 11/24/19 1437 11/25/19 0830  NA 133*  --  131* 132*  K 3.6   < > 3.3* 3.6  CL 90*  --  95* 97*  CO2 33*  --  29 23  GLUCOSE 89  --  106* 71  BUN 15  --  5* 10  CREATININE 5.90*  --  3.49* 4.06*  CALCIUM 8.3*  --  8.1* 7.9*  MG  --   --   --  1.8   < > = values in this interval not displayed.   GFR Estimated Creatinine Clearance: 15.8 mL/min (A) (by C-G formula based on SCr of 4.06 mg/dL (H)). Liver Function Tests: Recent Labs  Lab 11/21/19 1100 11/24/19 1437    AST 19 22  ALT <5 <5  ALKPHOS 88 91  BILITOT 0.7 0.5  PROT 6.3* 6.2*  ALBUMIN 2.7* 2.8*   No results for input(s): LIPASE, AMYLASE in the last 168 hours. No results for input(s): AMMONIA in the last 168 hours. Coagulation profile Recent Labs  Lab 11/24/19 1437 11/25/19 0830  INR 1.2 1.3*    CBC: Recent Labs  Lab 11/21/19 1100 11/24/19 1437 11/25/19 0830  WBC 7.0 7.9 8.5  NEUTROABS 3.9 3.6  --   HGB 10.4* 10.2* 10.0*  HCT 32.4* 32.1* 31.7*  MCV 85.3 87.5 88.3  PLT 247 209 212   Cardiac Enzymes: No results for input(s): CKTOTAL, CKMB, CKMBINDEX, TROPONINI in the last 168 hours. BNP (last 3 results) No results for input(s): PROBNP in the last 8760 hours. CBG: Recent Labs  Lab 11/24/19 2049 11/25/19 0736 11/25/19 0915 11/25/19 0941 11/25/19 1212  GLUCAP 74 70 65* 109* 117*   D-Dimer: No results for input(s): DDIMER in the last 72 hours. Hgb A1c: No results for input(s): HGBA1C in the last 72 hours. Lipid Profile: No results for input(s): CHOL, HDL, LDLCALC, TRIG, CHOLHDL, LDLDIRECT in the last 72 hours. Thyroid function studies: No results for input(s): TSH, T4TOTAL, T3FREE, THYROIDAB in the last 72 hours.  Invalid input(s): FREET3 Anemia work up: No results for input(s): VITAMINB12, FOLATE, FERRITIN, TIBC, IRON, RETICCTPCT in the last 72 hours. Sepsis Labs: Recent Labs  Lab 11/21/19 1100 11/24/19 1437 11/25/19 0830  WBC 7.0 7.9 8.5    Microbiology Recent Results (from the past 240 hour(s))  SARS CORONAVIRUS 2 (TAT 6-24 HRS) Nasopharyngeal Nasopharyngeal Swab     Status: None   Collection Time: 11/17/19  6:22 PM   Specimen: Nasopharyngeal Swab  Result Value Ref Range Status   SARS Coronavirus 2 NEGATIVE NEGATIVE Final    Comment: (NOTE) SARS-CoV-2 target nucleic acids are NOT DETECTED. The SARS-CoV-2 RNA is generally detectable in upper and lower respiratory specimens during the acute phase of infection. Negative results do not preclude  SARS-CoV-2 infection, do not rule out co-infections with other pathogens, and should not be used as the sole basis for treatment or other patient management decisions. Negative results must be combined with clinical observations, patient history, and epidemiological information. The expected result is Negative. Fact Sheet for Patients: SugarRoll.be Fact Sheet for Healthcare Providers: https://www.woods-mathews.com/ This test is not yet approved or cleared by the [place] FDA and  has been authorized for detection and/or diagnosis of SARS-CoV-2 by FDA under an Emergency Use Authorization (EUA). This EUA will remain  in effect (meaning this test can be used) for the duration of the COVID-19 declaration under Section 56 4(b)(1) of the Act, 21 U.S.C. section 360bbb-3(b)(1), unless the authorization  is terminated or revoked sooner. Performed at [facility], [place] [address] [place], [place] [address]   Respiratory Panel by RT PCR (Flu A&B, Covid) - Nasopharyngeal Swab     Status: None   Collection Time: 11/24/19  6:19 PM   Specimen: Nasopharyngeal Swab  Result Value Ref Range Status   SARS Coronavirus 2 by RT PCR NEGATIVE NEGATIVE Final    Comment: (NOTE) SARS-CoV-2 target nucleic acids are NOT DETECTED. The SARS-CoV-2 RNA is generally detectable in upper respiratoy specimens during the acute phase of infection. The lowest concentration of SARS-CoV-2 viral copies this assay can detect is 131 copies/mL. A negative result does not preclude SARS-Cov-2 infection and should not be used as the sole basis for treatment or other patient management decisions. A negative result may occur with  improper specimen collection/handling, submission of specimen other than nasopharyngeal swab, presence of viral mutation(s) within the areas targeted by this assay, and inadequate number of viral copies (<131 copies/mL). A negative result must be combined  with clinical observations, patient history, and epidemiological information. The expected result is Negative. Fact Sheet for Patients:  PinkCheek.be Fact Sheet for Healthcare Providers:  GravelBags.it This test is not yet ap proved or cleared by the [place] FDA and  has been authorized for detection and/or diagnosis of SARS-CoV-2 by FDA under an Emergency Use Authorization (EUA). This EUA will remain  in effect (meaning this test can be used) for the duration of the COVID-19 declaration under Section 564(b)(1) of the Act, 21 U.S.C. section 360bbb-3(b)(1), unless the authorization is terminated or revoked sooner.    Influenza A by PCR NEGATIVE NEGATIVE Final   Influenza B by PCR NEGATIVE NEGATIVE Final    Comment: (NOTE) The Xpert Xpress SARS-CoV-2/FLU/RSV assay is intended as an aid in  the diagnosis of influenza from Nasopharyngeal swab specimens and  should not be used as a sole basis for treatment. Nasal washings and  aspirates are unacceptable for Xpert Xpress SARS-CoV-2/FLU/RSV  testing. Fact Sheet for Patients: PinkCheek.be Fact Sheet for Healthcare Providers: GravelBags.it This test is not yet approved or cleared by the [place] FDA and  has been authorized for detection and/or diagnosis of SARS-CoV-2 by  FDA under an Emergency Use Authorization (EUA). This EUA will remain  in effect (meaning this test can be used) for the duration of the  Covid-19 declaration under Section 564(b)(1) of the Act, 21  U.S.C. section 360bbb-3(b)(1), unless the authorization is  terminated or revoked. Performed at [facility], [address] [place], [place] [address]   Blood culture (routine x 2)     Status: None (Preliminary result)   Collection Time: 11/24/19  7:46 PM   Specimen: BLOOD  Result Value Ref Range Status   Specimen Description BLOOD LEFT  JUGULAR  Final   Special Requests   Final    BOTTLES DRAWN AEROBIC AND ANAEROBIC Blood Culture results may not be optimal due to an inadequate volume of blood received in culture bottles   Culture   Final    NO GROWTH < 12 HOURS Performed at [facility], [address] [place], [place] [address]    Report Status PENDING  Incomplete  Blood culture (routine x 2)     Status: None (Preliminary result)   Collection Time: 11/24/19  7:46 PM   Specimen: BLOOD  Result Value Ref Range Status   Specimen Description BLOOD LEFT JUGULAR  Final   Special Requests   Final    BOTTLES  DRAWN AEROBIC AND ANAEROBIC Blood Culture adequate volume   Culture   Final    NO GROWTH < 12 HOURS Performed at [facility], [address] [place], [place] [address]    Report Status PENDING  Incomplete  MRSA PCR Screening     Status: None   Collection Time: 11/24/19  9:02 PM   Specimen: Nasal Mucosa; Nasopharyngeal  Result Value Ref Range Status   MRSA by PCR NEGATIVE NEGATIVE Final    Comment:        The GeneXpert MRSA Assay (FDA approved for NASAL specimens only), is one component of a comprehensive MRSA colonization surveillance program. It is not intended to diagnose MRSA infection nor to guide or monitor treatment for MRSA infections. Performed at [facility], [address] [place], [place] [address]   Aerobic/Anaerobic Culture (surgical/deep wound)     Status: None (Preliminary result)   Collection Time: 11/25/19 11:08 AM   Specimen: PATH Other; Tissue  Result Value Ref Range Status   Specimen Description   Final    WOUND LEFT AXILLA Performed at [facility], [address], [place], [place] [address]    Special Requests   Final    NONE Performed at [facility], [address] [place], [place] [address]    Gram Stain PENDING  Incomplete   Culture PENDING  Incomplete   Report Status PENDING  Incomplete    Procedures and diagnostic  studies:  CT HUMERUS LEFT WO CONTRAST  Result Date: 11/24/2019 CLINICAL DATA:  End-stage renal disease on hemodialysis with soft tissue swelling involving the left upper extremity. Patient had an infected left arm brachial artery AV graft EXAM: CT OF THE UPPER LEFT EXTREMITY WITHOUT CONTRAST TECHNIQUE: Multidetector CT imaging of the upper left extremity was performed according to the standard protocol. COMPARISON:  None. FINDINGS: There is a moderate size subcutaneous hematoma noted in the anterior aspect of the left upper arm. This measures 4.5 x 3.0 cm. There is a small hematoma just below this. Open wound is noted along the anterior aspect of the upper arm. There is diffuse subcutaneous soft tissue swelling/edema/fluid involving the entire left upper extremity. I do not see a discrete drainable soft tissue abscess. Suspect diffuse myofasciitis without definite findings for pyomyositis. No findings suspicious for septic arthritis or osteomyelitis. Suspect remote distal radius fracture with volar impaction. IMPRESSION: 1. Diffuse subcutaneous soft tissue swelling/edema/fluid involving the entire left upper extremity without discrete drainable soft tissue abscess. 2. Diffuse myofasciitis without definite findings for pyomyositis. 3. Moderate-sized subcutaneous hematoma involving the anterior aspect of the upper arm. Electronically Signed   By: Marijo Sanes M.D.   On: 11/24/2019 16:57   CT FOREARM LEFT WO CONTRAST  Result Date: 11/24/2019 CLINICAL DATA:  End-stage renal disease on hemodialysis with soft tissue swelling involving the left upper extremity. Patient had an infected left arm brachial artery AV graft EXAM: CT OF THE UPPER LEFT EXTREMITY WITHOUT CONTRAST TECHNIQUE: Multidetector CT imaging of the upper left extremity was performed according to the standard protocol. COMPARISON:  None. FINDINGS: There is a moderate size subcutaneous hematoma noted in the anterior aspect of the left upper arm. This  measures 4.5 x 3.0 cm. There is a small hematoma just below this. Open wound is noted along the anterior aspect of the upper arm. There is diffuse subcutaneous soft tissue swelling/edema/fluid involving the entire left upper extremity. I do not see a discrete drainable soft tissue abscess. Suspect diffuse myofasciitis without definite  findings for pyomyositis. No findings suspicious for septic arthritis or osteomyelitis. Suspect remote distal radius fracture with volar impaction. IMPRESSION: 1. Diffuse subcutaneous soft tissue swelling/edema/fluid involving the entire left upper extremity without discrete drainable soft tissue abscess. 2. Diffuse myofasciitis without definite findings for pyomyositis. 3. Moderate-sized subcutaneous hematoma involving the anterior aspect of the upper arm. Electronically Signed   By: Marijo Sanes M.D.   On: 11/24/2019 16:57   CT HAND LEFT WO CONTRAST  Result Date: 11/24/2019 CLINICAL DATA:  End-stage renal disease on hemodialysis with soft tissue swelling involving the left upper extremity. Patient had an infected left arm brachial artery AV graft EXAM: CT OF THE UPPER LEFT EXTREMITY WITHOUT CONTRAST TECHNIQUE: Multidetector CT imaging of the upper left extremity was performed according to the standard protocol. COMPARISON:  None. FINDINGS: There is a moderate size subcutaneous hematoma noted in the anterior aspect of the left upper arm. This measures 4.5 x 3.0 cm. There is a small hematoma just below this. Open wound is noted along the anterior aspect of the upper arm. There is diffuse subcutaneous soft tissue swelling/edema/fluid involving the entire left upper extremity. I do not see a discrete drainable soft tissue abscess. Suspect diffuse myofasciitis without definite findings for pyomyositis. No findings suspicious for septic arthritis or osteomyelitis. Suspect remote distal radius fracture with volar impaction. IMPRESSION: 1. Diffuse subcutaneous soft tissue  swelling/edema/fluid involving the entire left upper extremity without discrete drainable soft tissue abscess. 2. Diffuse myofasciitis without definite findings for pyomyositis. 3. Moderate-sized subcutaneous hematoma involving the anterior aspect of the upper arm. Electronically Signed   By: Marijo Sanes M.D.   On: 11/24/2019 16:57    Medications:   . amLODipine  10 mg Oral Daily  . [START ON 11/26/2019] vitamin C  500 mg Oral BID  . atorvastatin  20 mg Oral Daily  . carvedilol  6.25 mg Oral BID  . citalopram  20 mg Oral Daily  . [START ON 11/26/2019] feeding supplement (PRO-STAT SUGAR FREE 64)  30 mL Oral BID  . fentaNYL      . FLUoxetine  20 mg Oral QHS  . fluticasone  2 spray Each Nare Daily  . gabapentin  100 mg Oral BID  . gabapentin  200 mg Oral QHS  . hydrALAZINE  10 mg Oral TID  . insulin aspart  0-6 Units Subcutaneous TID WC  . lipase/protease/amylase  36,000 Units Oral TID WC  . meclizine  12.5 mg Oral BID  . multivitamin  1 tablet Oral Daily  . pantoprazole  40 mg Oral Daily  . sevelamer carbonate  2,400 mg Oral TID WC  . sodium chloride flush  3 mL Intravenous Q12H  . sodium chloride flush      . cyanocobalamin  1,000 mcg Oral Daily   Continuous Infusions: . sodium chloride Stopped (11/24/19 2207)  . piperacillin-tazobactam (ZOSYN)  IV Stopped (11/25/19 0207)  . vancomycin       LOS: 1 day   Maude Gloor  [facility]     11/25/2019, 4:38 PM

## 2019-11-25 NOTE — Progress Notes (Signed)
[facility] Daily Progress Note   Subjective: Presents with increased pain and swelling in left upper extremity.   Objective: Vitals:   11/25/19 0404 11/25/19 0542 11/25/19 0730 11/25/19 0909  BP: (!) 147/61  (!) 145/49 (!) 155/57  Pulse: (!) 49  (!) 46 (!) 50  Resp: 20  16 14   Temp: (!) 97.5 F (36.4 C)  97.7 F (36.5 C) (!) 97 F (36.1 C)  TempSrc: Oral  Oral Tympanic  SpO2: 99%  100% 100%  Weight:  84.5 kg    Height:        Intake/Output Summary (Last 24 hours) at 11/25/2019 0954 Last data filed at 11/25/2019 5277 Gross per 24 hour  Intake 466.97 ml  Output --  Net 466.97 ml   Physical Exam: A&Ox3, NAD CV: RRR Pulmonary: CTA Bilaterally Abdomen: Soft, Nontender, Nondistended Vascular: LUE wound is healing well. No signs of infection noted. Medial / Proximal hematoma - stable in size and appearance when compared to exam on discharge. No signs of infection on exam. Forearm with some swelling. Recommend elevation.  Extremity is soft. Hand warm. No acute vascular compromise noted to extremity.     Document Information Photos    11/24/2019 15:56  Attached To:  Hospital Encounter on 11/24/19  Source Information Maisey Deandrade, Abbe [place]  [facility]    Laboratory: CBC    Component Value Date/Time   WBC 8.5 11/25/2019 0830   HGB 10.0 (L) 11/25/2019 0830   HGB 10.5 (L) 09/15/2014 0948   HCT 31.7 (L) 11/25/2019 0830   HCT 34.2 (L) 09/15/2014 0948   PLT 212 11/25/2019 0830   PLT 203 09/15/2014 0948   BMET    Component Value Date/Time   NA 132 (L) 11/25/2019 0830   NA 135 (L) 09/15/2014 0948   K 3.6 11/25/2019 0830   K 4.8 09/15/2014 0948   CL 97 (L) 11/25/2019 0830   CL 99 09/15/2014 0948   CO2 23 11/25/2019 0830   CO2 26 09/15/2014 0948   GLUCOSE 71 11/25/2019 0830   GLUCOSE 118 (H) 09/15/2014 0948   BUN 10 11/25/2019 0830   BUN 19 (H) 09/15/2014 0948   CREATININE 4.06 (H) 11/25/2019 0830   CREATININE 6.79 (H)  09/15/2014 0948   CALCIUM 7.9 (L) 11/25/2019 0830   CALCIUM 8.3 (L) 09/15/2014 0948   GFRNONAA 11 (L) 11/25/2019 0830   GFRNONAA 7 (L) 09/15/2014 0948   GFRNONAA 6 (L) 05/31/2014 0432   GFRAA 13 (L) 11/25/2019 0830   GFRAA 8 (L) 09/15/2014 0948   GFRAA 7 (L) 05/31/2014 0432   Assessment/Planning:  1) Left Upper Extremity Wound: Based on CT scan results possible hematoma / abscess / fluid collection in LUE Plan for I&D and washout in OR in the AM Will pre-op.  Discussed with Dr. Eber Hong Cory Kitt PA-C 11/25/2019 9:54 AM

## 2019-11-25 NOTE — Consult Note (Addendum)
WOC consult requested for Vac dressing change on Mon with Vascular team present at that time to assess wound appearance. Reviewed EMR: pt went to the OR today for debridement of left arm full thickness wound and Vac was applied; she is still in the perioperative setting at this time.  [place] team will contact vascular team to arrange a time to perform the dressing change on Mon as requested.  Thank-you,  Julien Girt MSN, [place], [place], [place], [place]

## 2019-11-25 NOTE — Progress Notes (Signed)
Inpatient Diabetes Program Recommendations  AACE/ADA: New Consensus Statement on Inpatient Glycemic Control (2015)  Target Ranges:  Prepandial:   less than 140 mg/dL      Peak postprandial:   less than 180 mg/dL (1-2 hours)      Critically ill patients:  140 - 180 mg/dL   Results for [name], [name] (MRN [id number]) as of 11/25/2019 14:00  Ref. Range 11/24/2019 20:49 11/25/2019 07:36 11/25/2019 09:15 11/25/2019 09:41 11/25/2019 12:12  Glucose-Capillary Latest Ref Range: 70 - 99 mg/dL 74 70 65 (L) 109 (H)  10 mg Decadron 117 (H)    Admit with: Symptomatic left axillary hematoma  History: DM, ESRD, COPD  Home DM Meds: None listed  Current Orders: Novolog 0-6 units TID AC     Underwent I&D of axillary hematoma this AM--Got Decadron 10 mg X 1 dose at 10am.  CBGs controlled so far in the hospital setting.  Agree with current Novolog orders.     --Will follow patient during hospitalization--  Wyn Quaker RN, MSN, CDE Diabetes Coordinator Inpatient Glycemic Control Team Team Pager: [phone] (8a-5p)

## 2019-11-25 NOTE — Anesthesia Postprocedure Evaluation (Signed)
Anesthesia Post Note  Patient: ASERET HOFFMAN  Procedure(s) Performed: IRRIGATION AND DEBRIDEMENT EXTREMITY (Left ) APPLICATION OF WOUND VAC (N/A ) CENTRAL LINE INSERTION EVACUATION HEMATOMA (Left Arm Upper)  Patient location during evaluation: PACU Anesthesia Type: General Level of consciousness: awake and alert Pain management: pain level controlled Vital Signs Assessment: post-procedure vital signs reviewed and stable Respiratory status: spontaneous breathing and respiratory function stable Cardiovascular status: stable Anesthetic complications: no     Last Vitals:  Vitals:   11/25/19 1205 11/25/19 1220  BP: (!) 169/72 (!) 170/74  Pulse: (!) 50 (!) 53  Resp: 11 16  Temp: (!) 36.2 C   SpO2: 100% 100%    Last Pain:  Vitals:   11/25/19 1220  TempSrc:   PainSc: 10-Worst pain ever                 Miliani Deike K

## 2019-11-25 NOTE — Progress Notes (Signed)
HD Tx Started w/o complications    02/[birth year] 1915  Vital Signs  Pulse Rate 61  Resp 11  BP (!) 183/76  Oxygen Therapy  SpO2 100 %  O2 Device Room Air  Pain Assessment  Pain Scale 0-10  Pain Score 3  Pain Intervention(s) Medication (See eMAR)  During Hemodialysis Assessment  Blood Flow Rate (mL/min) 300 mL/min  Arterial Pressure (mmHg) -120 mmHg  Venous Pressure (mmHg) 90 mmHg  Transmembrane Pressure (mmHg) 60 mmHg  Ultrafiltration Rate (mL/min) 330 mL/min  Dialysate Flow Rate (mL/min) 500 ml/min  Conductivity: Machine  14  HD Safety Checks Performed Yes  Dialysis Fluid Bolus Normal Saline  Bolus Amount (mL) 250 mL  Intra-Hemodialysis Comments Tx initiated

## 2019-11-25 NOTE — Transfer of Care (Signed)
Immediate Anesthesia Transfer of Care Note  Patient: [name]  Procedure(s) Performed: IRRIGATION AND DEBRIDEMENT EXTREMITY (Left ) APPLICATION OF WOUND VAC (N/A ) CENTRAL LINE INSERTION EVACUATION HEMATOMA (Left Arm Upper)  Patient Location: PACU  Anesthesia Type:General  Level of Consciousness: awake, alert  and oriented  Airway & Oxygen Therapy: Patient Spontanous Breathing and Patient connected to face mask oxygen  Post-op Assessment: Report given to RN and Post -op Vital signs reviewed and stable  Post vital signs: Reviewed and stable  Last Vitals:  Vitals Value Taken Time  BP 169/72 11/25/19 1205  Temp    Pulse 52 11/25/19 1205  Resp 10 11/25/19 1205  SpO2 100 % 11/25/19 1205  Vitals shown include unvalidated device data.  Last Pain:  Vitals:   11/25/19 1205  TempSrc:   PainSc: 0-No pain      Patients Stated Pain Goal: 1 (45/03/88 8280)  Complications: No apparent anesthesia complications

## 2019-11-25 NOTE — Progress Notes (Signed)
Pre HD Tx Note  Pt arrived from her room to receive HD Tx. Pt left arm continues to be wrapped with occlusive bandage. Woun vac. Continues to be in place, pt reports soreness and pain on left arm but refused interventions. Pt reports no chest pain or SOB. Pt continues to be on RA SPO2 100%. Tx should run for 3 hrs on 3K2.5Ca. CVC dressing is changed   11/25/19 1900  Hand-Off documentation  Report given to (Full Name) Newt Minion RN   Report received from (Full Name) Langley Gauss RN  Vital Signs  Temp 98.3 F (36.8 C)  Temp Source Oral  Pulse Rate 60  Resp 12  BP (!) 164/72  BP Location Right Arm  BP Method Automatic  Patient Position (if appropriate) Lying  Oxygen Therapy  SpO2 100 %  O2 Device Room Air  Pain Assessment  Pain Scale 0-10  Pain Score 3  Pain Intervention(s) Medication (See eMAR)  Dialysis Weight  Weight 84.5 kg  Type of Weight Pre-Dialysis  Time-Out for Hemodialysis  What Procedure? HD  Pt Identifiers(min of two) First/Last Name;MRN/Account#  Correct Site? Yes  Correct Side? Yes  Correct Procedure? Yes  Consents Verified? Yes  Safety Precautions Reviewed? Yes  Engineer, civil (consulting) Number 2  Station Number 1  UF/Alarm Test Passed  Conductivity: Meter 14  Conductivity: Machine  13.8  pH 7.4  Reverse Osmosis Main  Normal Saline Lot Number [id number]  Dialyzer Lot Number [id number]  Disposable Set Lot Number [id number]  Machine Temperature 97.7 F (36.5 C)  Musician and Audible Yes  Blood Lines Intact and Secured Yes  Pre Treatment Patient Checks  Vascular access used during treatment Catheter  HD catheter dressing before treatment WDL  Patient is receiving dialysis in a chair  (In Bed)  Hepatitis B Surface Antigen Results Negative  Date Hepatitis B Surface Antigen Drawn 11/13/19  Hepatitis B Surface Antibody  (<10)  Date Hepatitis B Surface Antibody Drawn 11/13/19  Hemodialysis Consent Verified Yes  Hemodialysis Standing Orders Initiated Yes  ECG  (Telemetry) Monitor On Yes  Prime Ordered Normal Saline  Length of  DialysisTreatment -hour(s) 3 Hour(s)  Dialysis Treatment Comments  (Na140)  Dialyzer Elisio 17H NR  Dialysate 3K;2.5 Ca  Dialysis Anticoagulant None  Dialysate Flow Ordered 500  Blood Flow Rate Ordered 300 mL/min  Ultrafiltration Goal  (0.5)  Dialysis Blood Pressure Support Ordered Albumin  Education / Care Plan  Dialysis Education Provided Yes  Documented Education in Care Plan Yes  Hemodialysis Catheter Right Subclavian  Placement Date/Time: 11/14/19 1648   Time Out: Correct patient;Correct site;Correct procedure  Maximum sterile barrier precautions: Hand hygiene;Cap;Mask;Sterile gown;Sterile gloves;Large sterile sheet  Site Prep: Chlorhexidine (preferred)  Local Anes...  Site Condition No complications  Blue Lumen Status Infusing;Flushed;Blood return noted  Red Lumen Status Infusing;Flushed;Blood return noted  Purple Lumen Status N/A  Catheter fill solution Heparin 1000 units/ml  Catheter fill volume (Arterial) 1.6 cc  Catheter fill volume (Venous) 1.6  Dressing Type Biopatch  Dressing Status Dressing changed;Antimicrobial disc changed  Interventions New dressing;Dressing changed  Drainage Description None  Dressing Change Due 11/29/19

## 2019-11-26 LAB — CBC
HCT: 26.4 % — ABNORMAL LOW (ref 36.0–46.0)
Hemoglobin: 8.6 g/dL — ABNORMAL LOW (ref 12.0–15.0)
MCH: 27.8 pg (ref 26.0–34.0)
MCHC: 32.6 g/dL (ref 30.0–36.0)
MCV: 85.4 fL (ref 80.0–100.0)
Platelets: 203 10*3/uL (ref 150–400)
RBC: 3.09 MIL/uL — ABNORMAL LOW (ref 3.87–5.11)
RDW: 17.2 % — ABNORMAL HIGH (ref 11.5–15.5)
WBC: 5.7 10*3/uL (ref 4.0–10.5)
nRBC: 0 % (ref 0.0–0.2)

## 2019-11-26 LAB — GLUCOSE, CAPILLARY
Glucose-Capillary: 206 mg/dL — ABNORMAL HIGH (ref 70–99)
Glucose-Capillary: 150 mg/dL — ABNORMAL HIGH (ref 70–99)
Glucose-Capillary: 182 mg/dL — ABNORMAL HIGH (ref 70–99)
Glucose-Capillary: 141 mg/dL — ABNORMAL HIGH (ref 70–99)

## 2019-11-26 LAB — BASIC METABOLIC PANEL
Anion gap: 8 (ref 5–15)
BUN: 7 mg/dL (ref 6–20)
CO2: 28 mmol/L (ref 22–32)
Calcium: 8 mg/dL — ABNORMAL LOW (ref 8.9–10.3)
Chloride: 96 mmol/L — ABNORMAL LOW (ref 98–111)
Creatinine, Ser: 2.89 mg/dL — ABNORMAL HIGH (ref 0.44–1.00)
GFR calc Af Amer: 20 mL/min — ABNORMAL LOW (ref >60)
GFR calc non Af Amer: 17 mL/min — ABNORMAL LOW (ref >60)
Glucose, Bld: 235 mg/dL — ABNORMAL HIGH (ref 70–99)
Potassium: 3.8 mmol/L (ref 3.5–5.1)
Sodium: 132 mmol/L — ABNORMAL LOW (ref 135–145)

## 2019-11-26 LAB — MAGNESIUM: Magnesium: 1.8 mg/dL (ref 1.7–2.4)

## 2019-11-26 MED ORDER — SODIUM CHLORIDE 0.9% FLUSH
10.0000 mL | Freq: Two times a day (BID) | INTRAVENOUS | Status: DC
  Administered 2019-11-26 – 2019-11-29 (×7): 10 mL

## 2019-11-26 MED ORDER — SODIUM CHLORIDE 0.9% FLUSH: 10.0000 mL | INTRAVENOUS | Status: DC | PRN

## 2019-11-26 MED ORDER — CLOPIDOGREL BISULFATE 75 MG PO TABS
75.0000 mg | ORAL_TABLET | Freq: Every day | ORAL | Status: DC
  Administered 2019-11-26 – 2019-11-30 (×5): 75 mg via ORAL
  Filled 2019-11-26 (×5): qty 1

## 2019-11-26 MED ORDER — ASPIRIN EC 81 MG PO TBEC
81.0000 mg | DELAYED_RELEASE_TABLET | Freq: Every day | ORAL | Status: DC
  Administered 2019-11-26 – 2019-11-30 (×5): 81 mg via ORAL
  Filled 2019-11-26 (×5): qty 1

## 2019-11-26 NOTE — Plan of Care (Signed)
  Problem: Education: Goal: Knowledge of General Education information will improve Description: Including pain rating scale, medication(s)/side effects and non-pharmacologic comfort measures Outcome: Progressing   Problem: Health Behavior/Discharge Planning: Goal: Ability to manage health-related needs will improve Outcome: Not Progressing Note: Patient remains with a wound vac on. To be changed again on Monday morning. Will continue to monitor status / drainage for the remainder of the shift. Wenda Low [facility]

## 2019-11-26 NOTE — Progress Notes (Signed)
Subjective  - POD #1, status post placement of a central line in the right groin and dressing change to her left arm wound  She is currently walking with physical therapy   Physical Exam:  VAC remains in place.       Assessment/Plan:  POD #1  Stable from vascular perspective, we will plan on wound VAC change on Monday.  Rare WBCs seen on wound culture with no organisms.  Continue antibiotics.  [name] 11/26/2019 11:26 AM --  Vitals:   11/26/19 0635 11/26/19 0841  BP: (!) 151/54 (!) 155/46  Pulse: 70 70  Resp:  16  Temp:  97.7 F (36.5 C)  SpO2:  100%    Intake/Output Summary (Last 24 hours) at 11/26/2019 1126 Last data filed at 11/26/2019 0700 Gross per 24 hour  Intake 727 ml  Output 1130 ml  Net -403 ml     Laboratory CBC    Component Value Date/Time   WBC 5.7 11/26/2019 0426   HGB 8.6 (L) 11/26/2019 0426   HGB 10.5 (L) 09/15/2014 0948   HCT 26.4 (L) 11/26/2019 0426   HCT 34.2 (L) 09/15/2014 0948   PLT 203 11/26/2019 0426   PLT 203 09/15/2014 0948    BMET    Component Value Date/Time   NA 132 (L) 11/26/2019 0426   NA 135 (L) 09/15/2014 0948   K 3.8 11/26/2019 0426   K 4.8 09/15/2014 0948   CL 96 (L) 11/26/2019 0426   CL 99 09/15/2014 0948   CO2 28 11/26/2019 0426   CO2 26 09/15/2014 0948   GLUCOSE 235 (H) 11/26/2019 0426   GLUCOSE 118 (H) 09/15/2014 0948   BUN 7 11/26/2019 0426   BUN 19 (H) 09/15/2014 0948   CREATININE 2.89 (H) 11/26/2019 0426   CREATININE 6.79 (H) 09/15/2014 0948   CALCIUM 8.0 (L) 11/26/2019 0426   CALCIUM 8.3 (L) 09/15/2014 0948   GFRNONAA 17 (L) 11/26/2019 0426   GFRNONAA 7 (L) 09/15/2014 0948   GFRNONAA 6 (L) 05/31/2014 0432   GFRAA 20 (L) 11/26/2019 0426   GFRAA 8 (L) 09/15/2014 0948   GFRAA 7 (L) 05/31/2014 0432    COAG Lab Results  Component Value Date   INR 1.3 (H) 11/25/2019   INR 1.2 11/24/2019   INR 1.4 (H) 11/14/2019   No results found for: PTT  Antibiotics Anti-infectives (From  admission, onward)   Start     Dose/Rate Route Frequency Ordered Stop   11/25/19 1200  vancomycin (VANCOCIN) IVPB 1000 mg/200 mL premix     1,000 mg 200 mL/hr over 60 Minutes Intravenous Every M-W-F (Hemodialysis) 11/24/19 2122     11/24/19 2200  piperacillin-tazobactam (ZOSYN) IVPB 3.375 g  Status:  Discontinued     3.375 g 100 mL/hr over 30 Minutes Intravenous Every 8 hours 11/24/19 1913 11/24/19 2116   11/24/19 2200  piperacillin-tazobactam (ZOSYN) IVPB 3.375 g     3.375 g 12.5 mL/hr over 240 Minutes Intravenous Every 12 hours 11/24/19 2116     11/24/19 2130  vancomycin (VANCOREADY) IVPB 2000 mg/400 mL     2,000 mg 200 mL/hr over 120 Minutes Intravenous  Once 11/24/19 2117 11/25/19 0033   11/24/19 1915  vancomycin (VANCOREADY) IVPB 1250 mg/250 mL  Status:  Discontinued     1,250 mg 166.7 mL/hr over 90 Minutes Intravenous Every 24 hours 11/24/19 1913 11/24/19 2116   11/24/19 1745  vancomycin (VANCOCIN) IVPB 1000 mg/200 mL premix  Status:  Discontinued     1,000 mg  200 mL/hr over 60 Minutes Intravenous  Once 11/24/19 1735 11/24/19 2116   11/24/19 1745  piperacillin-tazobactam (ZOSYN) IVPB 3.375 g  Status:  Discontinued     3.375 g 100 mL/hr over 30 Minutes Intravenous  Once 11/24/19 1735 11/24/19 2115       V. Leia Alf, M.D., [facility] [place] Office: [phone] Pager:  [phone]

## 2019-11-26 NOTE — Plan of Care (Signed)
  Problem: Education: Goal: Knowledge of General Education information will improve Description: Including pain rating scale, medication(s)/side effects and non-pharmacologic comfort measures Outcome: Progressing   Problem: Clinical Measurements: Goal: Will remain free from infection Outcome: Progressing   Problem: Pain Managment: Goal: General experience of comfort will improve Outcome: Progressing   Problem: Safety: Goal: Ability to remain free from injury will improve Outcome: Progressing   

## 2019-11-26 NOTE — Evaluation (Addendum)
Physical Therapy Evaluation Patient Details Name: [name] MRN: [id number] DOB: [birth year] Today's Date: 11/26/2019   History of Present Illness  59 year old female with end-stage renal disease on hemodialysis (M, W, F), peripheral vascular disease, hypertension, type 2 diabetes mellitus with peripheral neuropathy and obesity presented with uncontrollable bleeding from left upper extremity AV fistula.  When EMS arrived they noticed copious amount of blood.  She was found to be minimally responsive and in hemorrhagic shock.  Her hemoglobin was 5.5.  She received 2 unit PRBC and taken to the OR by vascular surgeon and underwent excision of the infected left arm brachial artery AV graft and placement of wound VAC.  She was also found to be bradycardic in the 40s during the procedure and she was intubated and transferred to ICU. Hemoglobin and heart rate stabilizes and she was transferred out of ICU on 11/13/2019.    Clinical Impression  Patient was hyperverbal which interfered with obtaining history, but she was able to provide most information about her prior level of function before recent hospitalization. Most recently she was at [facility] facility but her usual home is a first floor apartment where she lives alone. She takes a Printmaker to HD and her family picks her up to go shopping. She states she was I with ADLs and ambulated household and short community distances with a SPC. At the nursing home she states she walked 5 feet with a RW. She states she has a rollator, w/c, elevated toilet seat, shower chair, and SPC at home and uses a tub/shower combo. Upon evaluation, she demo bed mobility with supervision and elevated HOB, transferred sit <> stand with RW and CGA or hand held assist min A, and ambulated 385 feet with RW and CGA. Chair follow provided initially for safety. Patient ambulated very slowly and was unable to find her room and needed repeated instructions on where to walk. She  demonstrated signs of slight cognitive deficit and her conversation was occasionally not appropriate for situation. Patient appears to have experienced a significant decline in functional mobility/independence and would benefit from short term rehab prior to returning home safely. Patient would benefit from skilled physical therapy to address remaining impairments and functional limitations (see PT Problem list below)  to work towards stated goals and return to PLOF or maximal functional independence.       Follow Up Recommendations SNF;Supervision for mobility/OOB    Equipment Recommendations  None recommended by PT(please confirm she has RW. If not she needs one.)    Recommendations for Other Services OT consult     Precautions / Restrictions Precautions Precautions: Fall Restrictions Weight Bearing Restrictions: No Other Position/Activity Restrictions: R femoral triple lumen      Mobility  Bed Mobility Overal bed mobility: Needs Assistance Bed Mobility: Supine to Sit;Sit to Supine     Supine to sit: HOB elevated;Supervision Sit to supine: HOB elevated;Supervision   General bed mobility comments: patien required assistance with blankes and pillows and line management  Transfers Overall transfer level: Needs assistance Equipment used: Rolling walker (2 wheeled) Transfers: Sit to/from Stand Sit to Stand: Min guard;Min assist;+2 safety/equipment         General transfer comment: Patient transfered bed to chair and chair <> chair with safe technique using RW and CGA for safety. Also completed chair to bed with hand held assist, and required min guard for safety. Required assistance for line management. Moves very slowly.  Ambulation/Gait Ambulation/Gait assistance: Min guard;+2 safety/equipment Gait Distance (Feet):  385 Feet Assistive device: Rolling walker (2 wheeled) Gait Pattern/deviations: Step-through pattern;Decreased stride length Gait velocity: very slow    General Gait Details: Patient reported that ambulation felt very good and she continued to want to walk further. She ambulated very slowly with RW and CGA for safety twice around B pod and once around nursing station. Required cuing for turns. Stated she thought her legs might not hold her for a transfer at the end of the ambulation, but was able to transfer without evidence of LE instability. Chair follow for first half of ambulation.  Stairs            Wheelchair Mobility    Modified Rankin (Stroke Patients Only)       Balance Overall balance assessment: Needs assistance Sitting-balance support: Single extremity supported;Feet supported Sitting balance-Leahy Scale: Good Sitting balance - Comments: steady sitting at EOB and edge of chair.   Standing balance support: Bilateral upper extremity supported Standing balance-Leahy Scale: Poor Standing balance comment: Patient reliant on RW or hand held assist for support.                             Pertinent Vitals/Pain Pain Assessment: No/denies pain Pain Score: 3  Pain Location: 2nd to taking pain medication Pain Descriptors / Indicators: Aching Pain Intervention(s): Repositioned;Monitored during session    Home Living Family/patient expects to be discharged to:: Skilled nursing facility Living Arrangements: Alone   Type of Home: Apartment Home Access: Level entry     Home Layout: One level Home Equipment: Walker - 4 wheels;Cane - single point;Cane - quad;Wheelchair - manual;Toilet riser Additional Comments: SPC out to [place] or car that takes her to HD or shopping    Prior Function Level of Independence: Needs assistance   Gait / Transfers Assistance Needed: Used RW for mobility in SNF, previously used [facility]  ADL's / Homemaking Assistance Needed: Pt requires help for IADLs such as transport and shopping. Family or Lucianne Lei assists with transportation. Pt reports being I with ADLs prior to past hospitalization. OT  reports required setup for dressing/baithng.  Comments: states she has family that lives close by but no one can stay with her. Lives in a a handicapped apartment but recently got a letter stating that someone has to be w/c bound to stay in handicapped apartment.     Hand Dominance   Dominant Hand: Left    Extremity/Trunk Assessment   Upper Extremity Assessment Upper Extremity Assessment: Defer to OT evaluation RUE Deficits / Details: R shoulder flexion AROM ~90*. Grip 4/5 RUE Coordination: WNL LUE Deficits / Details: L forearm wrapped in ace wrap, moderate edema in L hand - unable to make full fist. L shoulder AROM limited 2/2 pain. Grip 3/5 LUE Sensation: (Pt endorses tingling in LUE) LUE Coordination: WNL(increased time to open food packages)    Lower Extremity Assessment Lower Extremity Assessment: Generalized weakness RLE Deficits / Details: does not seem bothered by triple lumen at R femoral vein RLE Sensation: history of peripheral neuropathy(diabetic neuropathy) LLE Sensation: history of peripheral neuropathy(diabetic neuropathy)    Cervical / Trunk Assessment Cervical / Trunk Assessment: Kyphotic  Communication   Communication: No difficulties  Cognition Arousal/Alertness: Awake/alert Behavior During Therapy: WFL for tasks assessed/performed Overall Cognitive Status: Within Functional Limits for tasks assessed  General Comments: Patient is hyperverbal and requires extended time to communicate with her due to lack of pauses and desire to lead interaction. Does not remember where to turn to get to her room while ambulating.      General Comments General comments (skin integrity, edema, etc.): Mild edema in L hand, L forearm wrapped in ace wrap    Exercises Other Exercises Other Exercises: educated about role of PT in acute care setting. Cuing for finding room. Other Exercises: Self-feeding/drinking (including opening  packages, cutting food, preparing eggs/oatmeal), LUE HEP, positioning for edema management, sup<>sit, bed mobility, don/doff gown, don/doff B socks   Assessment/Plan    PT Assessment Patient needs continued PT services  PT Problem List Decreased strength;Decreased range of motion;Decreased activity tolerance;Decreased balance;Decreased mobility;Decreased coordination;Decreased knowledge of use of DME;Decreased safety awareness;Decreased knowledge of precautions;Decreased cognition       PT Treatment Interventions DME instruction;Gait training;Stair training;Functional mobility training;Therapeutic activities;Therapeutic exercise;Balance training;Neuromuscular re-education;Patient/family education;Cognitive remediation    PT Goals (Current goals can be found in the Care Plan section)  Acute Rehab PT Goals Patient Stated Goal: To be independent PT Goal Formulation: With patient Time For Goal Achievement: 12/10/19 Potential to Achieve Goals: Fair    Frequency Min 2X/week   Barriers to discharge Decreased caregiver support patient lacks supervision at home    Co-evaluation               AM-PAC PT "6 Clicks" Mobility  Outcome Measure Help needed turning from your back to your side while in a flat bed without using bedrails?: A Little Help needed moving from lying on your back to sitting on the side of a flat bed without using bedrails?: A Little Help needed moving to and from a bed to a chair (including a wheelchair)?: A Little Help needed standing up from a chair using your arms (e.g., wheelchair or bedside chair)?: A Little Help needed to walk in hospital room?: Total Help needed climbing 3-5 steps with a railing? : A Lot 6 Click Score: 15    End of Session Equipment Utilized During Treatment: Gait belt Activity Tolerance: Patient limited by pain;Patient limited by fatigue Patient left: with bed alarm set;with call bell/phone within reach;in bed Nurse Communication:  Mobility status PT Visit Diagnosis: Muscle weakness (generalized) (M62.81);Difficulty in walking, not elsewhere classified (R26.2);Unsteadiness on feet (R26.81)    Time: 1010-1055 PT Time Calculation (min) (ACUTE ONLY): 45 min   Charges:   PT Evaluation $PT Eval Moderate Complexity: 1 Mod PT Treatments $Gait Training: 8-22 mins $Therapeutic Activity: 8-22 mins       Everlean Alstrom. Graylon Good, PT, DPT 11/26/19, 11:27 AM

## 2019-11-26 NOTE — Progress Notes (Signed)
Pt was admitted in the floor with no signs of distress from pt dailysis. Pt has a wound vac and on continuous cycle with a target pressure of 125 mmhg. Pt was alert x 4. VSS. Will continue to monitor.

## 2019-11-26 NOTE — Progress Notes (Signed)
Pt was admitted on the floor with no signs of distress. Pt alert x4. VSS. Pt has a wound vac for pt left upper which is working well on continuous cycle with a target pressure 125 mmhg. Will continue to monitor.

## 2019-11-26 NOTE — Evaluation (Signed)
Occupational Therapy Evaluation Patient Details Name: [name] MRN: [id number] DOB: [birth year] Today's Date: 11/26/2019    History of Present Illness 59 year old female with end-stage renal disease on hemodialysis (M, W, F), peripheral vascular disease, hypertension, type 2 diabetes mellitus with peripheral neuropathy and obesity presented with uncontrollable bleeding from left upper extremity AV fistula. When EMS arrived they noticed copious amount of blood. She was found to be minimally responsive and in hemorrhagic shock. Her hemoglobin was 5.5. She received 2 unit PRBC and taken to the OR by vascular surgeon and underwent excision of the infected left arm brachial artery AV graft and placement of wound VAC. She was also found to be bradycardic in the 40s during the procedure and she was intubated and transferred to ICU.  Hemoglobin and heart rate stabilizes and she was transferred out of ICU on 11/13/2019.   Clinical Impression   [name] was seen for OT evaluation this date. Prior to hospital admission, pt was in a SNF requiring setup -MIN assist for ADLs and assist for iADLs. Pt used a RW for mobility at facility. Currently pt demonstrates impairments as described below (See OT problem list) which functionally limit her ability to perform ADL/self-care tasks. Pt currently requires MAX A for LB access seated EOB and MOD A don/doff gown. Pt is SUP + VCs for bimanual integration to self feed seated EOB. Pt would benefit from skilled OT to address noted impairments and functional limitations (see below for any additional details) in order to maximize safety and independence while minimizing falls risk and caregiver burden. Upon hospital discharge, recommend STR to maximize pt safety and return to PLOF.      Follow Up Recommendations  SNF    Equipment Recommendations       Recommendations for Other Services       Precautions / Restrictions Precautions Precautions:  Fall Restrictions Weight Bearing Restrictions: No      Mobility Bed Mobility Overal bed mobility: Needs Assistance Bed Mobility: Supine to Sit;Sit to Supine     Supine to sit: Min guard;HOB elevated Sit to supine: Min guard;HOB elevated      Transfers                 General transfer comment: OOB deferred 2/2 no RW in room and pt desiring to rest at end of session    Balance Overall balance assessment: Needs assistance Sitting-balance support: Single extremity supported;Feet supported Sitting balance-Leahy Scale: Fair Sitting balance - Comments: Posterior lean when attempting LB access seated EOB                                   ADL either performed or assessed with clinical judgement   ADL Overall ADL's : Needs assistance/impaired                                       General ADL Comments: Setup + increased time self-feeding with non-dominant R hand increasing to SUP c VCs to use L hand while seated EOB. MAX A  for LB access seated EOB. MOD A don/doff gown seated EOB     Vision Baseline Vision/History: Wears glasses Wears Glasses: Reading only Patient Visual Report: No change from baseline       Perception     Praxis      Pertinent Vitals/Pain Pain  Assessment: 0-10 Pain Score: 3  Pain Location: L hand, L shoulder Pain Descriptors / Indicators: Aching Pain Intervention(s): Repositioned;Monitored during session     Hand Dominance Left   Extremity/Trunk Assessment Upper Extremity Assessment Upper Extremity Assessment: LUE deficits/detail;RUE deficits/detail RUE Deficits / Details: R shoulder flexion AROM ~90*. Grip 4/5 RUE Coordination: WNL LUE Deficits / Details: L forearm wrapped in ace wrap, moderate edema in L hand - unable to make full fist. L shoulder AROM limited 2/2 pain. Grip 3/5 LUE Sensation: (Pt endorses tingling in LUE) LUE Coordination: WNL(increased time to open food packages)   Lower Extremity  Assessment Lower Extremity Assessment: LLE deficits/detail;RLE deficits/detail RLE Sensation: history of peripheral neuropathy(diabetic neuropathy) LLE Sensation: history of peripheral neuropathy(diabetic neuropathy)   Cervical / Trunk Assessment Cervical / Trunk Assessment: Kyphotic   Communication Communication Communication: No difficulties   Cognition Arousal/Alertness: Awake/alert Behavior During Therapy: WFL for tasks assessed/performed Overall Cognitive Status: Within Functional Limits for tasks assessed                                 General Comments: Pt is pleasant but perseverative on situation and talkative this session. Pt asks inappropriate questions at times.   General Comments  Mild edema in L hand, L forearm wrapped in ace wrap    Exercises Exercises: Other exercises Other Exercises Other Exercises: Pt educated re: falls prevention, edema management, importance of bimanual integration for improved fine motor coordination, AE recomendations, home/routine modifications Other Exercises: Self-feeding/drinking (including opening packages, cutting food, preparing eggs/oatmeal), LUE HEP, positioning for edema management, sup<>sit, bed mobility, don/doff gown, don/doff B socks   Shoulder Instructions      Home Living Family/patient expects to be discharged to:: Skilled nursing facility Living Arrangements: Alone   Type of Home: Apartment Home Access: Level entry     Home Layout: One level               Home Equipment: Walker - 2 wheels;Walker - 4 wheels;Cane - single point;Cane - quad;Wheelchair - manual          Prior Functioning/Environment Level of Independence: Needs assistance  Gait / Transfers Assistance Needed: Used RW for mobility in SNF ADL's / Homemaking Assistance Needed: Pt reports requiring setup for dressing/bathing, required assist for iADLs.    Comments: Pt enjoys cross stitch but has been unable to manage         OT  Problem List: Decreased strength;Decreased range of motion;Decreased activity tolerance;Decreased cognition;Decreased safety awareness;Decreased knowledge of use of DME or AE;Impaired sensation;Impaired UE functional use;Increased edema      OT Treatment/Interventions: Self-care/ADL training;Therapeutic exercise;Neuromuscular education;Energy conservation;DME and/or AE instruction;Therapeutic activities;Cognitive remediation/compensation;Patient/family education;Balance training    OT Goals(Current goals can be found in the care plan section) Acute Rehab OT Goals Patient Stated Goal: To be independent OT Goal Formulation: With patient Time For Goal Achievement: 12/10/19 Potential to Achieve Goals: Good ADL Goals Pt Will Perform Lower Body Dressing: with min guard assist;bed level;with adaptive equipment(c AE and LRAD PRN) Pt Will Transfer to Toilet: with min assist;ambulating(c LRAD PRN) Pt Will Perform Toileting - Clothing Manipulation and hygiene: with min guard assist;sit to/from stand(c LRAD & AE PRN) Pt/caregiver will Perform Home Exercise Program: Increased ROM;Increased strength;Left upper extremity;Independently  OT Frequency: Min 1X/week   Barriers to D/C: Decreased caregiver support          Co-evaluation  AM-PAC OT "6 Clicks" Daily Activity     Outcome Measure Help from another person eating meals?: A Little Help from another person taking care of personal grooming?: A Little Help from another person toileting, which includes using toliet, bedpan, or urinal?: A Lot Help from another person bathing (including washing, rinsing, drying)?: A Lot Help from another person to put on and taking off regular upper body clothing?: A Lot Help from another person to put on and taking off regular lower body clothing?: A Lot 6 Click Score: 14   End of Session    Activity Tolerance: Patient tolerated treatment well Patient left: in bed;with call bell/phone within  reach;with bed alarm set  OT Visit Diagnosis: Other abnormalities of gait and mobility (R26.89)                Time: 0813-8871 OT Time Calculation (min): 59 min Charges:  OT General Charges $OT Visit: 1 Visit OT Evaluation $OT Eval Moderate Complexity: 1 Mod OT Treatments $Self Care/Home Management : 38-52 mins $Therapeutic Exercise: 8-22 mins  Dessie Coma, M.S. OTR/L  11/26/19, 10:23 AM

## 2019-11-26 NOTE — Progress Notes (Addendum)
Progress Note    [name]  YVO:[id number] DOB: 06/04/1961  DOA: 11/24/2019 PCP: Freddy Finner, NP      Brief Narrative:    Medical records reviewed and are as summarized below:  [name] is an 59 y.o. female with medical history significant for end stage renal disease on hemodialysis for last fifteen years,MWF, pancreatic insufficiency,chronic essential hypertension, peripheral vascular disease, GERD, diabetes mellitus type II, anemia of chronic kidney disease and COPD. Patient was recently on admission and discharged on 02/25 on account of hemorraghic shock as a result  Infected left AV fistula graft.Patient underwent emergent graft removal and currently has a perm cath on right anterior chest for dialysis. She is currently at the nursing home and receives wound dressing daily and has a wound vac.She returns to the ED on account of complaints of worsening left upper extremity swelling and pain, worse over the past 24 hours. Denied any associated tingling sensation or loss of sensation. Admits to subjective chills but denies fever , nausea or vomiting.Denies any bleeding however from site of wound.she admits to being compliant with her medications including ASA and plavix.      Assessment/Plan:   Principal Problem:   Hematoma of arm, left, subsequent encounter Active Problems:   End-stage renal disease on hemodialysis (HCC)   1Left arm swelling/left upper extremity wound/left axillary hematoma:  S/P incision and drainage of left axillary hematoma by vascular surgeon on 11/25/2019.  Continue empiric antibiotics and local wound care.  Intraoperative cultures pending.  ESRD on HD MWF: Right femoral triple-lumen catheter was placed by vascular surgeon today for hemodialysis.  Follow-up with nephrologist.  Chronic diastolic CHF: Compensated  Chronic Essential Hypertension.  Continue antihypertensives.  Anemia of chronic kidney disease . Hemoglobin remains stable. Continue  with CBC monitoring daily.  CAD/PVD: Resume aspirin and Plavix.  Discussed with vascular surgeon, Dr. Trula Slade.  Secondary hyperparathyroidism: Continue with home dose of sevelamer   History of bradycardia: Heart rate is better     Body mass index is 32.21 kg/m.  (Obesity)   Family Communication/Anticipated D/C date and plan/Code Status   DVT prophylaxis: SCDs Code Status: Full code Family Communication: Plan discussed with patient Disposition Plan: Patient is from SNF and plan to discharge to SNF when cleared by vascular surgeon.      Subjective:   She still has some pain in the left arm.  No shortness of breath or chest pain.  She was able to ambulate with PT today.  Objective:    Vitals:   11/26/19 0504 11/26/19 0635 11/26/19 0841 11/26/19 1128  BP: (!) 171/68 (!) 151/54 (!) 155/46 123/90  Pulse: 72 70 70 64  Resp: 20  16 18   Temp: 98.4 F (36.9 C)  97.7 F (36.5 C)   TempSrc: Oral     SpO2: 100%  100% 100%  Weight: 87.8 kg     Height:        Intake/Output Summary (Last 24 hours) at 11/26/2019 1415 Last data filed at 11/26/2019 0700 Gross per 24 hour  Intake 677 ml  Output 1130 ml  Net -453 ml   Filed Weights   11/25/19 0542 11/25/19 1900 11/26/19 0504  Weight: 84.5 kg 84.5 kg 87.8 kg    Exam:  GEN: NAD SKIN: Wound vac on left arm wound. EYES: EOMI ENT: MMM CV: RRR PULM: CTA B ABD: soft, ND, NT, +BS CNS: AAO x 3, non focal EXT: Left arm swelling and tenderness. Mild swelling of  b/l legs   Data Reviewed:   I have personally reviewed following labs and imaging studies:  Labs: Labs show the following:   Basic Metabolic Panel: Recent Labs  Lab 11/21/19 1100 11/21/19 1100 11/24/19 1437 11/24/19 1437 11/25/19 0830 11/26/19 0426  NA 133*  --  131*  --  132* 132*  K 3.6   < > 3.3*   < > 3.6 3.8  CL 90*  --  95*  --  97* 96*  CO2 33*  --  29  --  23 28  GLUCOSE 89  --  106*  --  71 235*  BUN 15  --  5*  --  10 7  CREATININE 5.90*   --  3.49*  --  4.06* 2.89*  CALCIUM 8.3*  --  8.1*  --  7.9* 8.0*  MG  --   --   --   --  1.8 1.8   < > = values in this interval not displayed.   GFR Estimated Creatinine Clearance: 22.6 mL/min (A) (by C-G formula based on SCr of 2.89 mg/dL (H)). Liver Function Tests: Recent Labs  Lab 11/21/19 1100 11/24/19 1437  AST 19 22  ALT <5 <5  ALKPHOS 88 91  BILITOT 0.7 0.5  PROT 6.3* 6.2*  ALBUMIN 2.7* 2.8*   No results for input(s): LIPASE, AMYLASE in the last 168 hours. No results for input(s): AMMONIA in the last 168 hours. Coagulation profile Recent Labs  Lab 11/24/19 1437 11/25/19 0830  INR 1.2 1.3*    CBC: Recent Labs  Lab 11/21/19 1100 11/24/19 1437 11/25/19 0830 11/26/19 0426  WBC 7.0 7.9 8.5 5.7  NEUTROABS 3.9 3.6  --   --   HGB 10.4* 10.2* 10.0* 8.6*  HCT 32.4* 32.1* 31.7* 26.4*  MCV 85.3 87.5 88.3 85.4  PLT 247 209 212 203   Cardiac Enzymes: No results for input(s): CKTOTAL, CKMB, CKMBINDEX, TROPONINI in the last 168 hours. BNP (last 3 results) No results for input(s): PROBNP in the last 8760 hours. CBG: Recent Labs  Lab 11/25/19 1212 11/25/19 1637 11/25/19 2353 11/26/19 0834 11/26/19 1129  GLUCAP 117* 176* 171* 206* 150*   D-Dimer: No results for input(s): DDIMER in the last 72 hours. Hgb A1c: No results for input(s): HGBA1C in the last 72 hours. Lipid Profile: No results for input(s): CHOL, HDL, LDLCALC, TRIG, CHOLHDL, LDLDIRECT in the last 72 hours. Thyroid function studies: No results for input(s): TSH, T4TOTAL, T3FREE, THYROIDAB in the last 72 hours.  Invalid input(s): FREET3 Anemia work up: No results for input(s): VITAMINB12, FOLATE, FERRITIN, TIBC, IRON, RETICCTPCT in the last 72 hours. Sepsis Labs: Recent Labs  Lab 11/21/19 1100 11/24/19 1437 11/25/19 0830 11/26/19 0426  WBC 7.0 7.9 8.5 5.7    Microbiology Recent Results (from the past 240 hour(s))  SARS CORONAVIRUS 2 (TAT 6-24 HRS) Nasopharyngeal Nasopharyngeal Swab      Status: None   Collection Time: 11/17/19  6:22 PM   Specimen: Nasopharyngeal Swab  Result Value Ref Range Status   SARS Coronavirus 2 NEGATIVE NEGATIVE Final    Comment: (NOTE) SARS-CoV-2 target nucleic acids are NOT DETECTED. The SARS-CoV-2 RNA is generally detectable in upper and lower respiratory specimens during the acute phase of infection. Negative results do not preclude SARS-CoV-2 infection, do not rule out co-infections with other pathogens, and should not be used as the sole basis for treatment or other patient management decisions. Negative results must be combined with clinical observations, patient history, and epidemiological information. The  expected result is Negative. Fact Sheet for Patients: SugarRoll.be Fact Sheet for Healthcare Providers: https://www.woods-mathews.com/ This test is not yet approved or cleared by the [place] FDA and  has been authorized for detection and/or diagnosis of SARS-CoV-2 by FDA under an Emergency Use Authorization (EUA). This EUA will remain  in effect (meaning this test can be used) for the duration of the COVID-19 declaration under Section 56 4(b)(1) of the Act, 21 U.S.C. section 360bbb-3(b)(1), unless the authorization is terminated or revoked sooner. Performed at [facility], [place] [address] [place], [place] [address]   Respiratory Panel by RT PCR (Flu A&B, Covid) - Nasopharyngeal Swab     Status: None   Collection Time: 11/24/19  6:19 PM   Specimen: Nasopharyngeal Swab  Result Value Ref Range Status   SARS Coronavirus 2 by RT PCR NEGATIVE NEGATIVE Final    Comment: (NOTE) SARS-CoV-2 target nucleic acids are NOT DETECTED. The SARS-CoV-2 RNA is generally detectable in upper respiratoy specimens during the acute phase of infection. The lowest concentration of SARS-CoV-2 viral copies this assay can detect is 131 copies/mL. A negative result does not preclude SARS-Cov-2 infection  and should not be used as the sole basis for treatment or other patient management decisions. A negative result may occur with  improper specimen collection/handling, submission of specimen other than nasopharyngeal swab, presence of viral mutation(s) within the areas targeted by this assay, and inadequate number of viral copies (<131 copies/mL). A negative result must be combined with clinical observations, patient history, and epidemiological information. The expected result is Negative. Fact Sheet for Patients:  PinkCheek.be Fact Sheet for Healthcare Providers:  GravelBags.it This test is not yet ap proved or cleared by the [place] FDA and  has been authorized for detection and/or diagnosis of SARS-CoV-2 by FDA under an Emergency Use Authorization (EUA). This EUA will remain  in effect (meaning this test can be used) for the duration of the COVID-19 declaration under Section 564(b)(1) of the Act, 21 U.S.C. section 360bbb-3(b)(1), unless the authorization is terminated or revoked sooner.    Influenza A by PCR NEGATIVE NEGATIVE Final   Influenza B by PCR NEGATIVE NEGATIVE Final    Comment: (NOTE) The Xpert Xpress SARS-CoV-2/FLU/RSV assay is intended as an aid in  the diagnosis of influenza from Nasopharyngeal swab specimens and  should not be used as a sole basis for treatment. Nasal washings and  aspirates are unacceptable for Xpert Xpress SARS-CoV-2/FLU/RSV  testing. Fact Sheet for Patients: PinkCheek.be Fact Sheet for Healthcare Providers: GravelBags.it This test is not yet approved or cleared by the [place] FDA and  has been authorized for detection and/or diagnosis of SARS-CoV-2 by  FDA under an Emergency Use Authorization (EUA). This EUA will remain  in effect (meaning this test can be used) for the duration of the  Covid-19 declaration under Section  564(b)(1) of the Act, 21  U.S.C. section 360bbb-3(b)(1), unless the authorization is  terminated or revoked. Performed at [facility], [address] [place], [place] [address]   Blood culture (routine x 2)     Status: None (Preliminary result)   Collection Time: 11/24/19  7:46 PM   Specimen: BLOOD  Result Value Ref Range Status   Specimen Description BLOOD LEFT JUGULAR  Final   Special Requests   Final    BOTTLES DRAWN AEROBIC AND ANAEROBIC Blood Culture results may not be optimal due to an inadequate volume of blood received in culture bottles   Culture  Final    NO GROWTH 2 DAYS Performed at [facility], [address] [place], [place] [address]    Report Status PENDING  Incomplete  Blood culture (routine x 2)     Status: None (Preliminary result)   Collection Time: 11/24/19  7:46 PM   Specimen: BLOOD  Result Value Ref Range Status   Specimen Description BLOOD LEFT JUGULAR  Final   Special Requests   Final    BOTTLES DRAWN AEROBIC AND ANAEROBIC Blood Culture adequate volume   Culture   Final    NO GROWTH 2 DAYS Performed at [facility], [address], [place], [place] [address]    Report Status PENDING  Incomplete  MRSA PCR Screening     Status: None   Collection Time: 11/24/19  9:02 PM   Specimen: Nasal Mucosa; Nasopharyngeal  Result Value Ref Range Status   MRSA by PCR NEGATIVE NEGATIVE Final    Comment:        The GeneXpert MRSA Assay (FDA approved for NASAL specimens only), is one component of a comprehensive MRSA colonization surveillance program. It is not intended to diagnose MRSA infection nor to guide or monitor treatment for MRSA infections. Performed at [facility], [address] [place], [place] [address]   Aerobic/Anaerobic Culture (surgical/deep wound)     Status: None (Preliminary result)   Collection Time: 11/25/19 11:08 AM   Specimen: PATH Other; Tissue  Result Value Ref Range Status   Specimen  Description   Final    WOUND LEFT AXILLA Performed at [facility], [address] [place], [place] [address]    Special Requests   Final    NONE Performed at [facility], [place], [place] [address]    Gram Stain   Final    RARE WBC PRESENT,BOTH PMN AND MONONUCLEAR NO ORGANISMS SEEN    Culture   Final    NO GROWTH < 24 HOURS Performed at [facility], [place] [address], [place], [place] [address]    Report Status PENDING  Incomplete    Procedures and diagnostic studies:  CT HUMERUS LEFT WO CONTRAST  Result Date: 11/24/2019 CLINICAL DATA:  End-stage renal disease on hemodialysis with soft tissue swelling involving the left upper extremity. Patient had an infected left arm brachial artery AV graft EXAM: CT OF THE UPPER LEFT EXTREMITY WITHOUT CONTRAST TECHNIQUE: Multidetector CT imaging of the upper left extremity was performed according to the standard protocol. COMPARISON:  None. FINDINGS: There is a moderate size subcutaneous hematoma noted in the anterior aspect of the left upper arm. This measures 4.5 x 3.0 cm. There is a small hematoma just below this. Open wound is noted along the anterior aspect of the upper arm. There is diffuse subcutaneous soft tissue swelling/edema/fluid involving the entire left upper extremity. I do not see a discrete drainable soft tissue abscess. Suspect diffuse myofasciitis without definite findings for pyomyositis. No findings suspicious for septic arthritis or osteomyelitis. Suspect remote distal radius fracture with volar impaction. IMPRESSION: 1. Diffuse subcutaneous soft tissue swelling/edema/fluid involving the entire left upper extremity without discrete drainable soft tissue abscess. 2. Diffuse myofasciitis without definite findings for pyomyositis. 3. Moderate-sized subcutaneous hematoma involving the anterior aspect of the upper arm. Electronically Signed   By: Marijo Sanes M.D.   On: 11/24/2019 16:57   CT FOREARM  LEFT WO CONTRAST  Result Date: 11/24/2019 CLINICAL DATA:  End-stage renal disease on hemodialysis with soft tissue swelling involving  the left upper extremity. Patient had an infected left arm brachial artery AV graft EXAM: CT OF THE UPPER LEFT EXTREMITY WITHOUT CONTRAST TECHNIQUE: Multidetector CT imaging of the upper left extremity was performed according to the standard protocol. COMPARISON:  None. FINDINGS: There is a moderate size subcutaneous hematoma noted in the anterior aspect of the left upper arm. This measures 4.5 x 3.0 cm. There is a small hematoma just below this. Open wound is noted along the anterior aspect of the upper arm. There is diffuse subcutaneous soft tissue swelling/edema/fluid involving the entire left upper extremity. I do not see a discrete drainable soft tissue abscess. Suspect diffuse myofasciitis without definite findings for pyomyositis. No findings suspicious for septic arthritis or osteomyelitis. Suspect remote distal radius fracture with volar impaction. IMPRESSION: 1. Diffuse subcutaneous soft tissue swelling/edema/fluid involving the entire left upper extremity without discrete drainable soft tissue abscess. 2. Diffuse myofasciitis without definite findings for pyomyositis. 3. Moderate-sized subcutaneous hematoma involving the anterior aspect of the upper arm. Electronically Signed   By: Marijo Sanes M.D.   On: 11/24/2019 16:57   CT HAND LEFT WO CONTRAST  Result Date: 11/24/2019 CLINICAL DATA:  End-stage renal disease on hemodialysis with soft tissue swelling involving the left upper extremity. Patient had an infected left arm brachial artery AV graft EXAM: CT OF THE UPPER LEFT EXTREMITY WITHOUT CONTRAST TECHNIQUE: Multidetector CT imaging of the upper left extremity was performed according to the standard protocol. COMPARISON:  None. FINDINGS: There is a moderate size subcutaneous hematoma noted in the anterior aspect of the left upper arm. This measures 4.5 x 3.0 cm. There  is a small hematoma just below this. Open wound is noted along the anterior aspect of the upper arm. There is diffuse subcutaneous soft tissue swelling/edema/fluid involving the entire left upper extremity. I do not see a discrete drainable soft tissue abscess. Suspect diffuse myofasciitis without definite findings for pyomyositis. No findings suspicious for septic arthritis or osteomyelitis. Suspect remote distal radius fracture with volar impaction. IMPRESSION: 1. Diffuse subcutaneous soft tissue swelling/edema/fluid involving the entire left upper extremity without discrete drainable soft tissue abscess. 2. Diffuse myofasciitis without definite findings for pyomyositis. 3. Moderate-sized subcutaneous hematoma involving the anterior aspect of the upper arm. Electronically Signed   By: Marijo Sanes M.D.   On: 11/24/2019 16:57   US Venous Img Lower Bilateral (DVT)  Result Date: 11/25/2019 CLINICAL DATA:  Leg pain, swelling EXAM: BILATERAL LOWER EXTREMITY VENOUS DOPPLER ULTRASOUND TECHNIQUE: Gray-scale sonography with compression, as well as color and duplex ultrasound, were performed to evaluate the deep venous system(s) from the level of the common femoral vein through the popliteal and proximal calf veins. COMPARISON:  None. FINDINGS: VENOUS Normal compressibility of the common femoral, superficial femoral, and popliteal veins, as well as the visualized calf veins. Visualized portions of profunda femoral vein and great saphenous vein unremarkable. No filling defects to suggest DVT on grayscale or color Doppler imaging. Doppler waveforms show normal direction of venous flow, normal respiratory phasicity and response to augmentation. OTHER None. Limitations: none IMPRESSION: No evidence of lower extremity DVT. Electronically Signed   By: Rolm Baptise M.D.   On: 11/25/2019 16:57    Medications:   . amLODipine  10 mg Oral Daily  . vitamin C  500 mg Oral BID  . atorvastatin  20 mg Oral Daily  . carvedilol   6.25 mg Oral BID  . Chlorhexidine Gluconate Cloth  6 each Topical Q0600  . citalopram  20 mg Oral Daily  . [  START ON 11/28/2019] epoetin (EPOGEN/PROCRIT) injection  4,000 Units Intravenous Q M,W,F-HD  . feeding supplement (PRO-STAT SUGAR FREE 64)  30 mL Oral BID  . FLUoxetine  20 mg Oral QHS  . fluticasone  2 spray Each Nare Daily  . gabapentin  100 mg Oral BID  . gabapentin  200 mg Oral QHS  . hydrALAZINE  10 mg Oral TID  . insulin aspart  0-6 Units Subcutaneous TID WC  . lipase/protease/amylase  36,000 Units Oral TID WC  . meclizine  12.5 mg Oral BID  . multivitamin  1 tablet Oral Daily  . pantoprazole  40 mg Oral Daily  . sevelamer carbonate  2,400 mg Oral TID WC  . sodium chloride flush  10-40 mL Intracatheter Q12H  . sodium chloride flush  3 mL Intravenous Q12H  . cyanocobalamin  1,000 mcg Oral Daily   Continuous Infusions: . sodium chloride Stopped (11/24/19 2207)  . piperacillin-tazobactam (ZOSYN)  IV 3.375 g (11/26/19 0943)  . vancomycin Stopped (11/25/19 2346)     LOS: 2 days   Shamiah Kahler  [facility]     11/26/2019, 2:15 PM

## 2019-11-26 NOTE — Progress Notes (Signed)
[facility], [place] 11/26/19  Subjective:  Patient still has swelling in her left upper extremity. Due for dialysis again on Monday. Pain control is fair.    Objective:  Vital signs in last 24 hours:  Temp:  [97.1 F (36.2 C)-98.6 F (37 C)] 97.7 F (36.5 C) (03/06 0841) Pulse Rate:  [50-72] 70 (03/06 0841) Resp:  [11-27] 16 (03/06 0841) BP: (148-183)/(46-78) 155/46 (03/06 0841) SpO2:  [95 %-100 %] 100 % (03/06 0841) Weight:  [84.5 kg-87.8 kg] 87.8 kg (03/06 0504)  Weight change: 2.853 kg Filed Weights   11/25/19 0542 11/25/19 1900 11/26/19 0504  Weight: 84.5 kg 84.5 kg 87.8 kg    Intake/Output:    Intake/Output Summary (Last 24 hours) at 11/26/2019 1022 Last data filed at 11/26/2019 0700 Gross per 24 hour  Intake 1227 ml  Output 1155 ml  Net 72 ml     Physical Exam: General:  No acute distress  HEENT  normocephalic atraumatic hearing intact  Pulm/lungs  clear to auscultation bilateral, normal effort  CVS/Heart  regular, no rubs  Abdomen:   Soft, nontender, nondistended, bowel sounds present  Extremities:  Trace lower extremity edema  Neurologic:  Awake, alert, follows commands  Skin:  Warm, dry  Access:  IJ PermCath.  Wound VAC in old access in left upper extremity.       Basic Metabolic Panel:  Recent Labs  Lab 11/21/19 1100 11/21/19 1100 11/24/19 1437 11/25/19 0830 11/26/19 0426  NA 133*  --  131* 132* 132*  K 3.6  --  3.3* 3.6 3.8  CL 90*  --  95* 97* 96*  CO2 33*  --  29 23 28   GLUCOSE 89  --  106* 71 235*  BUN 15  --  5* 10 7  CREATININE 5.90*  --  3.49* 4.06* 2.89*  CALCIUM 8.3*   < > 8.1* 7.9* 8.0*  MG  --   --   --  1.8 1.8   < > = values in this interval not displayed.     CBC: Recent Labs  Lab 11/21/19 1100 11/24/19 1437 11/25/19 0830 11/26/19 0426  WBC 7.0 7.9 8.5 5.7  NEUTROABS 3.9 3.6  --   --   HGB 10.4* 10.2* 10.0* 8.6*  HCT 32.4* 32.1* 31.7* 26.4*  MCV 85.3 87.5 88.3 85.4  PLT 247 209 212  203      Lab Results  Component Value Date   HEPBSAG Negative (A) 11/13/2019   HEPBIGM  06/12/2010    NEGATIVE (NOTE) High levels of Hepatitis B Core IgM antibody are detectable during the acute stage of Hepatitis B. This antibody is used to differentiate current from past HBV infection.       Microbiology:  Recent Results (from the past 240 hour(s))  SARS CORONAVIRUS 2 (TAT 6-24 HRS) Nasopharyngeal Nasopharyngeal Swab     Status: None   Collection Time: 11/17/19  6:22 PM   Specimen: Nasopharyngeal Swab  Result Value Ref Range Status   SARS Coronavirus 2 NEGATIVE NEGATIVE Final    Comment: (NOTE) SARS-CoV-2 target nucleic acids are NOT DETECTED. The SARS-CoV-2 RNA is generally detectable in upper and lower respiratory specimens during the acute phase of infection. Negative results do not preclude SARS-CoV-2 infection, do not rule out co-infections with other pathogens, and should not be used as the sole basis for treatment or other patient management decisions. Negative results must be combined with clinical observations, patient history, and epidemiological information. The expected result is Negative.  Fact Sheet for Patients: SugarRoll.be Fact Sheet for Healthcare Providers: https://www.woods-mathews.com/ This test is not yet approved or cleared by the [place] FDA and  has been authorized for detection and/or diagnosis of SARS-CoV-2 by FDA under an Emergency Use Authorization (EUA). This EUA will remain  in effect (meaning this test can be used) for the duration of the COVID-19 declaration under Section 56 4(b)(1) of the Act, 21 U.S.C. section 360bbb-3(b)(1), unless the authorization is terminated or revoked sooner. Performed at [facility], [place] [address] [place], [place] [address]   Respiratory Panel by RT PCR (Flu A&B, Covid) - Nasopharyngeal Swab     Status: None   Collection Time: 11/24/19  6:19 PM    Specimen: Nasopharyngeal Swab  Result Value Ref Range Status   SARS Coronavirus 2 by RT PCR NEGATIVE NEGATIVE Final    Comment: (NOTE) SARS-CoV-2 target nucleic acids are NOT DETECTED. The SARS-CoV-2 RNA is generally detectable in upper respiratoy specimens during the acute phase of infection. The lowest concentration of SARS-CoV-2 viral copies this assay can detect is 131 copies/mL. A negative result does not preclude SARS-Cov-2 infection and should not be used as the sole basis for treatment or other patient management decisions. A negative result may occur with  improper specimen collection/handling, submission of specimen other than nasopharyngeal swab, presence of viral mutation(s) within the areas targeted by this assay, and inadequate number of viral copies (<131 copies/mL). A negative result must be combined with clinical observations, patient history, and epidemiological information. The expected result is Negative. Fact Sheet for Patients:  PinkCheek.be Fact Sheet for Healthcare Providers:  GravelBags.it This test is not yet ap proved or cleared by the [place] FDA and  has been authorized for detection and/or diagnosis of SARS-CoV-2 by FDA under an Emergency Use Authorization (EUA). This EUA will remain  in effect (meaning this test can be used) for the duration of the COVID-19 declaration under Section 564(b)(1) of the Act, 21 U.S.C. section 360bbb-3(b)(1), unless the authorization is terminated or revoked sooner.    Influenza A by PCR NEGATIVE NEGATIVE Final   Influenza B by PCR NEGATIVE NEGATIVE Final    Comment: (NOTE) The Xpert Xpress SARS-CoV-2/FLU/RSV assay is intended as an aid in  the diagnosis of influenza from Nasopharyngeal swab specimens and  should not be used as a sole basis for treatment. Nasal washings and  aspirates are unacceptable for Xpert Xpress SARS-CoV-2/FLU/RSV  testing. Fact Sheet  for Patients: PinkCheek.be Fact Sheet for Healthcare Providers: GravelBags.it This test is not yet approved or cleared by the [place] FDA and  has been authorized for detection and/or diagnosis of SARS-CoV-2 by  FDA under an Emergency Use Authorization (EUA). This EUA will remain  in effect (meaning this test can be used) for the duration of the  Covid-19 declaration under Section 564(b)(1) of the Act, 21  U.S.C. section 360bbb-3(b)(1), unless the authorization is  terminated or revoked. Performed at [facility], [address] [place], [place] [address]   Blood culture (routine x 2)     Status: None (Preliminary result)   Collection Time: 11/24/19  7:46 PM   Specimen: BLOOD  Result Value Ref Range Status   Specimen Description BLOOD LEFT JUGULAR  Final   Special Requests   Final    BOTTLES DRAWN AEROBIC AND ANAEROBIC Blood Culture results may not be optimal due to an inadequate volume of blood received in culture bottles   Culture   Final  NO GROWTH 2 DAYS Performed at [facility], [address] [place], [place] [address]    Report Status PENDING  Incomplete  Blood culture (routine x 2)     Status: None (Preliminary result)   Collection Time: 11/24/19  7:46 PM   Specimen: BLOOD  Result Value Ref Range Status   Specimen Description BLOOD LEFT JUGULAR  Final   Special Requests   Final    BOTTLES DRAWN AEROBIC AND ANAEROBIC Blood Culture adequate volume   Culture   Final    NO GROWTH 2 DAYS Performed at [facility], [address] [place], [place] [address]    Report Status PENDING  Incomplete  MRSA PCR Screening     Status: None   Collection Time: 11/24/19  9:02 PM   Specimen: Nasal Mucosa; Nasopharyngeal  Result Value Ref Range Status   MRSA by PCR NEGATIVE NEGATIVE Final    Comment:        The GeneXpert MRSA Assay (FDA approved for NASAL specimens only), is one  component of a comprehensive MRSA colonization surveillance program. It is not intended to diagnose MRSA infection nor to guide or monitor treatment for MRSA infections. Performed at [facility], [address] [place], [place] [address]   Aerobic/Anaerobic Culture (surgical/deep wound)     Status: None (Preliminary result)   Collection Time: 11/25/19 11:08 AM   Specimen: PATH Other; Tissue  Result Value Ref Range Status   Specimen Description   Final    WOUND LEFT AXILLA Performed at [facility], [address] [place], [place] [address]    Special Requests   Final    NONE Performed at [facility], [address] [place], [place] [address]    Gram Stain   Final    RARE WBC PRESENT,BOTH PMN AND MONONUCLEAR NO ORGANISMS SEEN Performed at [facility], [place] [address], [place],  [address]    Culture PENDING  Incomplete   Report Status PENDING  Incomplete    Coagulation Studies: Recent Labs    11/24/19 1437 11/25/19 0830  LABPROT 14.9 16.5*  INR 1.2 1.3*    Urinalysis: No results for input(s): COLORURINE, LABSPEC, PHURINE, GLUCOSEU, HGBUR, BILIRUBINUR, KETONESUR, PROTEINUR, UROBILINOGEN, NITRITE, LEUKOCYTESUR in the last 72 hours.  Invalid input(s): APPERANCEUR    Imaging: CT HUMERUS LEFT WO CONTRAST  Result Date: 11/24/2019 CLINICAL DATA:  End-stage renal disease on hemodialysis with soft tissue swelling involving the left upper extremity. Patient had an infected left arm brachial artery AV graft EXAM: CT OF THE UPPER LEFT EXTREMITY WITHOUT CONTRAST TECHNIQUE: Multidetector CT imaging of the upper left extremity was performed according to the standard protocol. COMPARISON:  None. FINDINGS: There is a moderate size subcutaneous hematoma noted in the anterior aspect of the left upper arm. This measures 4.5 x 3.0 cm. There is a small hematoma just below this. Open wound is noted along the anterior aspect of the upper arm. There is  diffuse subcutaneous soft tissue swelling/edema/fluid involving the entire left upper extremity. I do not see a discrete drainable soft tissue abscess. Suspect diffuse myofasciitis without definite findings for pyomyositis. No findings suspicious for septic arthritis or osteomyelitis. Suspect remote distal radius fracture with volar impaction. IMPRESSION: 1. Diffuse subcutaneous soft tissue swelling/edema/fluid involving the entire left upper extremity without discrete drainable soft tissue abscess. 2. Diffuse myofasciitis without definite findings for pyomyositis. 3. Moderate-sized subcutaneous hematoma involving the anterior aspect of the upper arm. Electronically Signed   By:  Marijo Sanes M.D.   On: 11/24/2019 16:57   CT FOREARM LEFT WO CONTRAST  Result Date: 11/24/2019 CLINICAL DATA:  End-stage renal disease on hemodialysis with soft tissue swelling involving the left upper extremity. Patient had an infected left arm brachial artery AV graft EXAM: CT OF THE UPPER LEFT EXTREMITY WITHOUT CONTRAST TECHNIQUE: Multidetector CT imaging of the upper left extremity was performed according to the standard protocol. COMPARISON:  None. FINDINGS: There is a moderate size subcutaneous hematoma noted in the anterior aspect of the left upper arm. This measures 4.5 x 3.0 cm. There is a small hematoma just below this. Open wound is noted along the anterior aspect of the upper arm. There is diffuse subcutaneous soft tissue swelling/edema/fluid involving the entire left upper extremity. I do not see a discrete drainable soft tissue abscess. Suspect diffuse myofasciitis without definite findings for pyomyositis. No findings suspicious for septic arthritis or osteomyelitis. Suspect remote distal radius fracture with volar impaction. IMPRESSION: 1. Diffuse subcutaneous soft tissue swelling/edema/fluid involving the entire left upper extremity without discrete drainable soft tissue abscess. 2. Diffuse myofasciitis without definite  findings for pyomyositis. 3. Moderate-sized subcutaneous hematoma involving the anterior aspect of the upper arm. Electronically Signed   By: Marijo Sanes M.D.   On: 11/24/2019 16:57   CT HAND LEFT WO CONTRAST  Result Date: 11/24/2019 CLINICAL DATA:  End-stage renal disease on hemodialysis with soft tissue swelling involving the left upper extremity. Patient had an infected left arm brachial artery AV graft EXAM: CT OF THE UPPER LEFT EXTREMITY WITHOUT CONTRAST TECHNIQUE: Multidetector CT imaging of the upper left extremity was performed according to the standard protocol. COMPARISON:  None. FINDINGS: There is a moderate size subcutaneous hematoma noted in the anterior aspect of the left upper arm. This measures 4.5 x 3.0 cm. There is a small hematoma just below this. Open wound is noted along the anterior aspect of the upper arm. There is diffuse subcutaneous soft tissue swelling/edema/fluid involving the entire left upper extremity. I do not see a discrete drainable soft tissue abscess. Suspect diffuse myofasciitis without definite findings for pyomyositis. No findings suspicious for septic arthritis or osteomyelitis. Suspect remote distal radius fracture with volar impaction. IMPRESSION: 1. Diffuse subcutaneous soft tissue swelling/edema/fluid involving the entire left upper extremity without discrete drainable soft tissue abscess. 2. Diffuse myofasciitis without definite findings for pyomyositis. 3. Moderate-sized subcutaneous hematoma involving the anterior aspect of the upper arm. Electronically Signed   By: Marijo Sanes M.D.   On: 11/24/2019 16:57   US Venous Img Lower Bilateral (DVT)  Result Date: 11/25/2019 CLINICAL DATA:  Leg pain, swelling EXAM: BILATERAL LOWER EXTREMITY VENOUS DOPPLER ULTRASOUND TECHNIQUE: Gray-scale sonography with compression, as well as color and duplex ultrasound, were performed to evaluate the deep venous system(s) from the level of the common femoral vein through the  popliteal and proximal calf veins. COMPARISON:  None. FINDINGS: VENOUS Normal compressibility of the common femoral, superficial femoral, and popliteal veins, as well as the visualized calf veins. Visualized portions of profunda femoral vein and great saphenous vein unremarkable. No filling defects to suggest DVT on grayscale or color Doppler imaging. Doppler waveforms show normal direction of venous flow, normal respiratory phasicity and response to augmentation. OTHER None. Limitations: none IMPRESSION: No evidence of lower extremity DVT. Electronically Signed   By: Rolm Baptise M.D.   On: 11/25/2019 16:57     Medications:   . sodium chloride Stopped (11/24/19 2207)  . piperacillin-tazobactam (ZOSYN)  IV 3.375 g (11/26/19 0943)  .  vancomycin Stopped (11/25/19 2346)   . amLODipine  10 mg Oral Daily  . vitamin C  500 mg Oral BID  . atorvastatin  20 mg Oral Daily  . carvedilol  6.25 mg Oral BID  . Chlorhexidine Gluconate Cloth  6 each Topical Q0600  . citalopram  20 mg Oral Daily  . [START ON 11/28/2019] epoetin (EPOGEN/PROCRIT) injection  4,000 Units Intravenous Q M,W,F-HD  . feeding supplement (PRO-STAT SUGAR FREE 64)  30 mL Oral BID  . FLUoxetine  20 mg Oral QHS  . fluticasone  2 spray Each Nare Daily  . gabapentin  100 mg Oral BID  . gabapentin  200 mg Oral QHS  . hydrALAZINE  10 mg Oral TID  . insulin aspart  0-6 Units Subcutaneous TID WC  . lipase/protease/amylase  36,000 Units Oral TID WC  . meclizine  12.5 mg Oral BID  . multivitamin  1 tablet Oral Daily  . pantoprazole  40 mg Oral Daily  . sevelamer carbonate  2,400 mg Oral TID WC  . sodium chloride flush  10-40 mL Intracatheter Q12H  . sodium chloride flush  3 mL Intravenous Q12H  . cyanocobalamin  1,000 mcg Oral Daily   sodium chloride, acetaminophen **OR** acetaminophen, albuterol, hydrALAZINE, LORazepam, nitroGLYCERIN, oxyCODONE, sodium chloride flush  Assessment/ Plan:  59 y.o. female with end stage renal disease on  hemodialysis for last fifteen years, pancreatic insufficiency, hypertension, peripheral vascular disease, GERD, diabetes mellitus type II, COPD  Active Problems:   End-stage renal disease on hemodialysis ([place])   Hematoma of arm, left, subsequent encounter  [facility]   #. ESRD Patient due for dialysis again on Monday.  We will prepare orders.  #. Anemia of CKD  Lab Results  Component Value Date   HGB 8.6 (L) 11/26/2019   Continue Epogen with dialysis treatments.  #. SHPTH     Component Value Date/Time   PTH 357 (H) 10/05/2018 1241   Lab Results  Component Value Date   PHOS 2.2 (L) 11/18/2019   Monitor calcium and phos level during this admission  #. Diabetes type 2 with CKD Hemoglobin A1C (%)  Date Value  07/19/2013 9.3 (H)   Hgb A1c MFr Bld (%)  Date Value  11/15/2019 4.7 (L)   Glycemic control as per hospitalist.   LOS: 2 Lanasia Porras 3/6/202110:22 [place], [place]

## 2019-11-27 LAB — GLUCOSE, CAPILLARY
Glucose-Capillary: 100 mg/dL — ABNORMAL HIGH (ref 70–99)
Glucose-Capillary: 126 mg/dL — ABNORMAL HIGH (ref 70–99)
Glucose-Capillary: 118 mg/dL — ABNORMAL HIGH (ref 70–99)
Glucose-Capillary: 100 mg/dL — ABNORMAL HIGH (ref 70–99)

## 2019-11-27 NOTE — Progress Notes (Addendum)
Progress Note    [name]  WFU:[id number] DOB: 1960-10-05  DOA: 11/24/2019 PCP: Freddy Finner, NP      Brief Narrative:    Medical records reviewed and are as summarized below:  [name] is an 59 y.o. female with medical history significant for end stage renal disease on hemodialysis for last fifteen years,MWF, pancreatic insufficiency,chronic essential hypertension, peripheral vascular disease, GERD, diabetes mellitus type II, anemia of chronic kidney disease and COPD. Patient was recently on admission and discharged on 02/25 on account of hemorraghic shock as a result  Infected left AV fistula graft.Patient underwent emergent graft removal and currently has a perm cath on right anterior chest for dialysis. She is currently at the nursing home and receives wound dressing daily and has a wound vac.She returns to the ED on account of complaints of worsening left upper extremity swelling and pain, worse over the past 24 hours. Denied any associated tingling sensation or loss of sensation. Admits to subjective chills but denies fever , nausea or vomiting.Denies any bleeding however from site of wound.she admits to being compliant with her medications including ASA and plavix.      Assessment/Plan:   Principal Problem:   Hematoma of arm, left, subsequent encounter Active Problems:   End-stage renal disease on hemodialysis (HCC)   Left arm swelling/left upper extremity wound/left axillary hematoma:  S/P incision and drainage of left axillary hematoma by vascular surgeon on 11/25/2019.  Continue empiric antibiotics and local wound care.  Intraoperative cultures pending.  ESRD on HD MWF: Right femoral triple-lumen catheter was placed by vascular surgeon on 11/25/2019 for hemodialysis.  Follow-up with nephrologist.  Chronic diastolic CHF: Compensated  Chronic Essential Hypertension.  Continue antihypertensives.  Anemia of chronic kidney disease . Hemoglobin remains stable.    CAD/PVD: Continue aspirin Plavix.  Secondary hyperparathyroidism: Continue with home dose of sevelamer   Sinus bradycardia: Asymptomatic.  Heart rate is stable.        Body mass index is 31.37 kg/m.  (Obesity)   Family Communication/Anticipated D/C date and plan/Code Status   DVT prophylaxis: SCDs Code Status: Full code Family Communication: Plan discussed with patient Disposition Plan: Patient is from SNF and plan to discharge to SNF when cleared by vascular surgeon.      Subjective:   She feels better today.  No chest pain or shortness of breath.  Pain in left arm is a little better.  Objective:    Vitals:   11/27/19 0517 11/27/19 0538 11/27/19 0750 11/27/19 1151  BP: 124/79  (!) 153/54 (!) 153/60  Pulse: (!) 54 (!) 55 (!) 58 (!) 57  Resp: 20  18   Temp: 98 F (36.7 C)  98.4 F (36.9 C)   TempSrc:      SpO2: 100%  100% 100%  Weight:      Height:        Intake/Output Summary (Last 24 hours) at 11/27/2019 1208 Last data filed at 11/27/2019 0515 Gross per 24 hour  Intake 50 ml  Output 50 ml  Net 0 ml   Filed Weights   11/25/19 1900 11/26/19 0504 11/27/19 0515  Weight: 84.5 kg 87.8 kg 85.5 kg    Exam:  GEN: NAD SKIN: Wound vac on left arm wound. EYES: EOMI ENT: MMM CV: RRR PULM: CTA B ABD: soft, ND, NT, +BS CNS: AAO x 3, non focal EXT: Left arm swelling and tenderness improving. Mild swelling and tenderness of b/l legs   Data Reviewed:  I have personally reviewed following labs and imaging studies:  Labs: Labs show the following:   Basic Metabolic Panel: Recent Labs  Lab 11/21/19 1100 11/21/19 1100 11/24/19 1437 11/24/19 1437 11/25/19 0830 11/26/19 0426  NA 133*  --  131*  --  132* 132*  K 3.6   < > 3.3*   < > 3.6 3.8  CL 90*  --  95*  --  97* 96*  CO2 33*  --  29  --  23 28  GLUCOSE 89  --  106*  --  71 235*  BUN 15  --  5*  --  10 7  CREATININE 5.90*  --  3.49*  --  4.06* 2.89*  CALCIUM 8.3*  --  8.1*  --  7.9* 8.0*  MG   --   --   --   --  1.8 1.8   < > = values in this interval not displayed.   GFR Estimated Creatinine Clearance: 22.4 mL/min (A) (by C-G formula based on SCr of 2.89 mg/dL (H)). Liver Function Tests: Recent Labs  Lab 11/21/19 1100 11/24/19 1437  AST 19 22  ALT <5 <5  ALKPHOS 88 91  BILITOT 0.7 0.5  PROT 6.3* 6.2*  ALBUMIN 2.7* 2.8*   No results for input(s): LIPASE, AMYLASE in the last 168 hours. No results for input(s): AMMONIA in the last 168 hours. Coagulation profile Recent Labs  Lab 11/24/19 1437 11/25/19 0830  INR 1.2 1.3*    CBC: Recent Labs  Lab 11/21/19 1100 11/24/19 1437 11/25/19 0830 11/26/19 0426  WBC 7.0 7.9 8.5 5.7  NEUTROABS 3.9 3.6  --   --   HGB 10.4* 10.2* 10.0* 8.6*  HCT 32.4* 32.1* 31.7* 26.4*  MCV 85.3 87.5 88.3 85.4  PLT 247 209 212 203   Cardiac Enzymes: No results for input(s): CKTOTAL, CKMB, CKMBINDEX, TROPONINI in the last 168 hours. BNP (last 3 results) No results for input(s): PROBNP in the last 8760 hours. CBG: Recent Labs  Lab 11/26/19 1129 11/26/19 1706 11/26/19 2043 11/27/19 0747 11/27/19 1151  GLUCAP 150* 182* 141* 100* 126*   D-Dimer: No results for input(s): DDIMER in the last 72 hours. Hgb A1c: No results for input(s): HGBA1C in the last 72 hours. Lipid Profile: No results for input(s): CHOL, HDL, LDLCALC, TRIG, CHOLHDL, LDLDIRECT in the last 72 hours. Thyroid function studies: No results for input(s): TSH, T4TOTAL, T3FREE, THYROIDAB in the last 72 hours.  Invalid input(s): FREET3 Anemia work up: No results for input(s): VITAMINB12, FOLATE, FERRITIN, TIBC, IRON, RETICCTPCT in the last 72 hours. Sepsis Labs: Recent Labs  Lab 11/21/19 1100 11/24/19 1437 11/25/19 0830 11/26/19 0426  WBC 7.0 7.9 8.5 5.7    Microbiology Recent Results (from the past 240 hour(s))  SARS CORONAVIRUS 2 (TAT 6-24 HRS) Nasopharyngeal Nasopharyngeal Swab     Status: None   Collection Time: 11/17/19  6:22 PM   Specimen:  Nasopharyngeal Swab  Result Value Ref Range Status   SARS Coronavirus 2 NEGATIVE NEGATIVE Final    Comment: (NOTE) SARS-CoV-2 target nucleic acids are NOT DETECTED. The SARS-CoV-2 RNA is generally detectable in upper and lower respiratory specimens during the acute phase of infection. Negative results do not preclude SARS-CoV-2 infection, do not rule out co-infections with other pathogens, and should not be used as the sole basis for treatment or other patient management decisions. Negative results must be combined with clinical observations, patient history, and epidemiological information. The expected result is Negative. Fact Sheet for Patients:  SugarRoll.be Fact Sheet for Healthcare Providers: https://www.woods-mathews.com/ This test is not yet approved or cleared by the [place] FDA and  has been authorized for detection and/or diagnosis of SARS-CoV-2 by FDA under an Emergency Use Authorization (EUA). This EUA will remain  in effect (meaning this test can be used) for the duration of the COVID-19 declaration under Section 56 4(b)(1) of the Act, 21 U.S.C. section 360bbb-3(b)(1), unless the authorization is terminated or revoked sooner. Performed at [facility], [place] [address], [place], [place] [address]   Respiratory Panel by RT PCR (Flu A&B, Covid) - Nasopharyngeal Swab     Status: None   Collection Time: 11/24/19  6:19 PM   Specimen: Nasopharyngeal Swab  Result Value Ref Range Status   SARS Coronavirus 2 by RT PCR NEGATIVE NEGATIVE Final    Comment: (NOTE) SARS-CoV-2 target nucleic acids are NOT DETECTED. The SARS-CoV-2 RNA is generally detectable in upper respiratoy specimens during the acute phase of infection. The lowest concentration of SARS-CoV-2 viral copies this assay can detect is 131 copies/mL. A negative result does not preclude SARS-Cov-2 infection and should not be used as the sole basis for treatment  or other patient management decisions. A negative result may occur with  improper specimen collection/handling, submission of specimen other than nasopharyngeal swab, presence of viral mutation(s) within the areas targeted by this assay, and inadequate number of viral copies (<131 copies/mL). A negative result must be combined with clinical observations, patient history, and epidemiological information. The expected result is Negative. Fact Sheet for Patients:  PinkCheek.be Fact Sheet for Healthcare Providers:  GravelBags.it This test is not yet ap proved or cleared by the [place] FDA and  has been authorized for detection and/or diagnosis of SARS-CoV-2 by FDA under an Emergency Use Authorization (EUA). This EUA will remain  in effect (meaning this test can be used) for the duration of the COVID-19 declaration under Section 564(b)(1) of the Act, 21 U.S.C. section 360bbb-3(b)(1), unless the authorization is terminated or revoked sooner.    Influenza A by PCR NEGATIVE NEGATIVE Final   Influenza B by PCR NEGATIVE NEGATIVE Final    Comment: (NOTE) The Xpert Xpress SARS-CoV-2/FLU/RSV assay is intended as an aid in  the diagnosis of influenza from Nasopharyngeal swab specimens and  should not be used as a sole basis for treatment. Nasal washings and  aspirates are unacceptable for Xpert Xpress SARS-CoV-2/FLU/RSV  testing. Fact Sheet for Patients: PinkCheek.be Fact Sheet for Healthcare Providers: GravelBags.it This test is not yet approved or cleared by the [place] FDA and  has been authorized for detection and/or diagnosis of SARS-CoV-2 by  FDA under an Emergency Use Authorization (EUA). This EUA will remain  in effect (meaning this test can be used) for the duration of the  Covid-19 declaration under Section 564(b)(1) of the Act, 21  U.S.C. section  360bbb-3(b)(1), unless the authorization is  terminated or revoked. Performed at [facility], [address] [place], [place] [address]   Blood culture (routine x 2)     Status: None (Preliminary result)   Collection Time: 11/24/19  7:46 PM   Specimen: BLOOD  Result Value Ref Range Status   Specimen Description BLOOD LEFT JUGULAR  Final   Special Requests   Final    BOTTLES DRAWN AEROBIC AND ANAEROBIC Blood Culture results may not be optimal due to an inadequate volume of blood received in culture bottles   Culture   Final    NO GROWTH 3 DAYS  Performed at [facility], [address] [place], [place] [address]    Report Status PENDING  Incomplete  Blood culture (routine x 2)     Status: None (Preliminary result)   Collection Time: 11/24/19  7:46 PM   Specimen: BLOOD  Result Value Ref Range Status   Specimen Description BLOOD LEFT JUGULAR  Final   Special Requests   Final    BOTTLES DRAWN AEROBIC AND ANAEROBIC Blood Culture adequate volume   Culture   Final    NO GROWTH 3 DAYS Performed at [facility], [address] [place], [place] [address]    Report Status PENDING  Incomplete  MRSA PCR Screening     Status: None   Collection Time: 11/24/19  9:02 PM   Specimen: Nasal Mucosa; Nasopharyngeal  Result Value Ref Range Status   MRSA by PCR NEGATIVE NEGATIVE Final    Comment:        The GeneXpert MRSA Assay (FDA approved for NASAL specimens only), is one component of a comprehensive MRSA colonization surveillance program. It is not intended to diagnose MRSA infection nor to guide or monitor treatment for MRSA infections. Performed at [facility], [address] [place], [place] [address]   Aerobic/Anaerobic Culture (surgical/deep wound)     Status: None (Preliminary result)   Collection Time: 11/25/19 11:08 AM   Specimen: PATH Other; Tissue  Result Value Ref Range Status   Specimen Description   Final    WOUND LEFT  AXILLA Performed at [facility], [address], [place], [place] [address]    Special Requests   Final    NONE Performed at [facility], [place], [place] [address]    Gram Stain   Final    RARE WBC PRESENT,BOTH PMN AND MONONUCLEAR NO ORGANISMS SEEN    Culture   Final    NO GROWTH 2 DAYS NO ANAEROBES ISOLATED; CULTURE IN PROGRESS FOR 5 DAYS Performed at [facility], [place] [address], [place], [place] [address]    Report Status PENDING  Incomplete    Procedures and diagnostic studies:  US Venous Img Lower Bilateral (DVT)  Result Date: 11/25/2019 CLINICAL DATA:  Leg pain, swelling EXAM: BILATERAL LOWER EXTREMITY VENOUS DOPPLER ULTRASOUND TECHNIQUE: Gray-scale sonography with compression, as well as color and duplex ultrasound, were performed to evaluate the deep venous system(s) from the level of the common femoral vein through the popliteal and proximal calf veins. COMPARISON:  None. FINDINGS: VENOUS Normal compressibility of the common femoral, superficial femoral, and popliteal veins, as well as the visualized calf veins. Visualized portions of profunda femoral vein and great saphenous vein unremarkable. No filling defects to suggest DVT on grayscale or color Doppler imaging. Doppler waveforms show normal direction of venous flow, normal respiratory phasicity and response to augmentation. OTHER None. Limitations: none IMPRESSION: No evidence of lower extremity DVT. Electronically Signed   By: Rolm Baptise M.D.   On: 11/25/2019 16:57    Medications:   . amLODipine  10 mg Oral Daily  . vitamin C  500 mg Oral BID  . aspirin EC  81 mg Oral Daily  . atorvastatin  20 mg Oral Daily  . carvedilol  6.25 mg Oral BID  . Chlorhexidine Gluconate Cloth  6 each Topical Q0600  . citalopram  20 mg Oral Daily  . clopidogrel  75 mg Oral Daily  . [START ON 11/28/2019] epoetin (EPOGEN/PROCRIT) injection  4,000 Units Intravenous Q M,W,F-HD  .  feeding supplement  (PRO-STAT SUGAR FREE 64)  30 mL Oral BID  . FLUoxetine  20 mg Oral QHS  . fluticasone  2 spray Each Nare Daily  . gabapentin  100 mg Oral BID  . gabapentin  200 mg Oral QHS  . hydrALAZINE  10 mg Oral TID  . insulin aspart  0-6 Units Subcutaneous TID WC  . lipase/protease/amylase  36,000 Units Oral TID WC  . meclizine  12.5 mg Oral BID  . multivitamin  1 tablet Oral Daily  . pantoprazole  40 mg Oral Daily  . sevelamer carbonate  2,400 mg Oral TID WC  . sodium chloride flush  10-40 mL Intracatheter Q12H  . sodium chloride flush  3 mL Intravenous Q12H  . cyanocobalamin  1,000 mcg Oral Daily   Continuous Infusions: . sodium chloride Stopped (11/26/19 2246)  . piperacillin-tazobactam (ZOSYN)  IV 3.375 g (11/27/19 1000)  . vancomycin Stopped (11/25/19 2346)     LOS: 3 days   Boneta Standre  [facility]     11/27/2019, 12:08 PM

## 2019-11-27 NOTE — Plan of Care (Signed)
?  Problem: Education: ?Goal: Knowledge of General Education information will improve ?Description: Including pain rating scale, medication(s)/side effects and non-pharmacologic comfort measures ?Outcome: Progressing ?  ?Problem: Health Behavior/Discharge Planning: ?Goal: Ability to manage health-related needs will improve ?Outcome: Progressing ?  ?Problem: Activity: ?Goal: Risk for activity intolerance will decrease ?Outcome: Progressing ?  ?Problem: Pain Managment: ?Goal: General experience of comfort will improve ?Outcome: Progressing ?  ?Problem: Safety: ?Goal: Ability to remain free from injury will improve ?Outcome: Progressing ?  ?

## 2019-11-27 NOTE — Progress Notes (Signed)
[name], [place] 11/27/19  Subjective:  Patient seen and evaluated at bedside. Swelling in the left upper extremity has improved significantly. Due for dialysis again tomorrow.    Objective:  Vital signs in last 24 hours:  Temp:  [98 F (36.7 C)-98.4 F (36.9 C)] 98.4 F (36.9 C) (03/07 0750) Pulse Rate:  [54-65] 57 (03/07 1151) Resp:  [18-20] 18 (03/07 0750) BP: (124-161)/(52-89) 153/60 (03/07 1151) SpO2:  [100 %] 100 % (03/07 1151) Weight:  [85.5 kg] 85.5 kg (03/07 0515)  Weight change: 1.003 kg Filed Weights   11/25/19 1900 11/26/19 0504 11/27/19 0515  Weight: 84.5 kg 87.8 kg 85.5 kg    Intake/Output:    Intake/Output Summary (Last 24 hours) at 11/27/2019 1222 Last data filed at 11/27/2019 0515 Gross per 24 hour  Intake 50 ml  Output 50 ml  Net 0 ml     Physical Exam: General:  No acute distress  HEENT  normocephalic atraumatic hearing intact  Pulm/lungs  clear to auscultation bilateral, normal effort  CVS/Heart  regular, no rubs  Abdomen:   Soft, nontender, nondistended, bowel sounds present  Extremities:  Trace lower extremity edema, mild swelling in left upper extremity.  Neurologic:  Awake, alert, follows commands  Skin:  Warm, dry  Access:  IJ PermCath.  Wound VAC in old access in left upper extremity.       Basic Metabolic Panel:  Recent Labs  Lab 11/21/19 1100 11/21/19 1100 11/24/19 1437 11/25/19 0830 11/26/19 0426  NA 133*  --  131* 132* 132*  K 3.6  --  3.3* 3.6 3.8  CL 90*  --  95* 97* 96*  CO2 33*  --  29 23 28   GLUCOSE 89  --  106* 71 235*  BUN 15  --  5* 10 7  CREATININE 5.90*  --  3.49* 4.06* 2.89*  CALCIUM 8.3*   < > 8.1* 7.9* 8.0*  MG  --   --   --  1.8 1.8   < > = values in this interval not displayed.     CBC: Recent Labs  Lab 11/21/19 1100 11/24/19 1437 11/25/19 0830 11/26/19 0426  WBC 7.0 7.9 8.5 5.7  NEUTROABS 3.9 3.6  --   --   HGB 10.4* 10.2* 10.0* 8.6*  HCT 32.4* 32.1* 31.7*  26.4*  MCV 85.3 87.5 88.3 85.4  PLT 247 209 212 203      Lab Results  Component Value Date   HEPBSAG Negative (A) 11/13/2019   HEPBIGM  06/12/2010    NEGATIVE (NOTE) High levels of Hepatitis B Core IgM antibody are detectable during the acute stage of Hepatitis B. This antibody is used to differentiate current from past HBV infection.       Microbiology:  Recent Results (from the past 240 hour(s))  SARS CORONAVIRUS 2 (TAT 6-24 HRS) Nasopharyngeal Nasopharyngeal Swab     Status: None   Collection Time: 11/17/19  6:22 PM   Specimen: Nasopharyngeal Swab  Result Value Ref Range Status   SARS Coronavirus 2 NEGATIVE NEGATIVE Final    Comment: (NOTE) SARS-CoV-2 target nucleic acids are NOT DETECTED. The SARS-CoV-2 RNA is generally detectable in upper and lower respiratory specimens during the acute phase of infection. Negative results do not preclude SARS-CoV-2 infection, do not rule out co-infections with other pathogens, and should not be used as the sole basis for treatment or other patient management decisions. Negative results must be combined with clinical observations, patient history, and epidemiological information.  The expected result is Negative. Fact Sheet for Patients: SugarRoll.be Fact Sheet for Healthcare Providers: https://www.woods-mathews.com/ This test is not yet approved or cleared by the [place] FDA and  has been authorized for detection and/or diagnosis of SARS-CoV-2 by FDA under an Emergency Use Authorization (EUA). This EUA will remain  in effect (meaning this test can be used) for the duration of the COVID-19 declaration under Section 56 4(b)(1) of the Act, 21 U.S.C. section 360bbb-3(b)(1), unless the authorization is terminated or revoked sooner. Performed at [facility], [place] [address], [place], [place] [address]   Respiratory Panel by RT PCR (Flu A&B, Covid) - Nasopharyngeal Swab     Status: None    Collection Time: 11/24/19  6:19 PM   Specimen: Nasopharyngeal Swab  Result Value Ref Range Status   SARS Coronavirus 2 by RT PCR NEGATIVE NEGATIVE Final    Comment: (NOTE) SARS-CoV-2 target nucleic acids are NOT DETECTED. The SARS-CoV-2 RNA is generally detectable in upper respiratoy specimens during the acute phase of infection. The lowest concentration of SARS-CoV-2 viral copies this assay can detect is 131 copies/mL. A negative result does not preclude SARS-Cov-2 infection and should not be used as the sole basis for treatment or other patient management decisions. A negative result may occur with  improper specimen collection/handling, submission of specimen other than nasopharyngeal swab, presence of viral mutation(s) within the areas targeted by this assay, and inadequate number of viral copies (<131 copies/mL). A negative result must be combined with clinical observations, patient history, and epidemiological information. The expected result is Negative. Fact Sheet for Patients:  PinkCheek.be Fact Sheet for Healthcare Providers:  GravelBags.it This test is not yet ap proved or cleared by the [place] FDA and  has been authorized for detection and/or diagnosis of SARS-CoV-2 by FDA under an Emergency Use Authorization (EUA). This EUA will remain  in effect (meaning this test can be used) for the duration of the COVID-19 declaration under Section 564(b)(1) of the Act, 21 U.S.C. section 360bbb-3(b)(1), unless the authorization is terminated or revoked sooner.    Influenza A by PCR NEGATIVE NEGATIVE Final   Influenza B by PCR NEGATIVE NEGATIVE Final    Comment: (NOTE) The Xpert Xpress SARS-CoV-2/FLU/RSV assay is intended as an aid in  the diagnosis of influenza from Nasopharyngeal swab specimens and  should not be used as a sole basis for treatment. Nasal washings and  aspirates are unacceptable for Xpert Xpress  SARS-CoV-2/FLU/RSV  testing. Fact Sheet for Patients: PinkCheek.be Fact Sheet for Healthcare Providers: GravelBags.it This test is not yet approved or cleared by the [place] FDA and  has been authorized for detection and/or diagnosis of SARS-CoV-2 by  FDA under an Emergency Use Authorization (EUA). This EUA will remain  in effect (meaning this test can be used) for the duration of the  Covid-19 declaration under Section 564(b)(1) of the Act, 21  U.S.C. section 360bbb-3(b)(1), unless the authorization is  terminated or revoked. Performed at [facility], [address] [place], [place] [address]   Blood culture (routine x 2)     Status: None (Preliminary result)   Collection Time: 11/24/19  7:46 PM   Specimen: BLOOD  Result Value Ref Range Status   Specimen Description BLOOD LEFT JUGULAR  Final   Special Requests   Final    BOTTLES DRAWN AEROBIC AND ANAEROBIC Blood Culture results may not be optimal due to an inadequate volume of blood received in culture bottles   Culture  Final    NO GROWTH 3 DAYS Performed at [facility], [address] [place], [place] [address]    Report Status PENDING  Incomplete  Blood culture (routine x 2)     Status: None (Preliminary result)   Collection Time: 11/24/19  7:46 PM   Specimen: BLOOD  Result Value Ref Range Status   Specimen Description BLOOD LEFT JUGULAR  Final   Special Requests   Final    BOTTLES DRAWN AEROBIC AND ANAEROBIC Blood Culture adequate volume   Culture   Final    NO GROWTH 3 DAYS Performed at [facility], [address], [place], [place] [address]    Report Status PENDING  Incomplete  MRSA PCR Screening     Status: None   Collection Time: 11/24/19  9:02 PM   Specimen: Nasal Mucosa; Nasopharyngeal  Result Value Ref Range Status   MRSA by PCR NEGATIVE NEGATIVE Final    Comment:        The GeneXpert MRSA Assay (FDA approved  for NASAL specimens only), is one component of a comprehensive MRSA colonization surveillance program. It is not intended to diagnose MRSA infection nor to guide or monitor treatment for MRSA infections. Performed at [facility], [address] [place], [place] [address]   Aerobic/Anaerobic Culture (surgical/deep wound)     Status: None (Preliminary result)   Collection Time: 11/25/19 11:08 AM   Specimen: PATH Other; Tissue  Result Value Ref Range Status   Specimen Description   Final    WOUND LEFT AXILLA Performed at [facility], [address], [place], [place] [address]    Special Requests   Final    NONE Performed at [facility], [place], [place] [address]    Gram Stain   Final    RARE WBC PRESENT,BOTH PMN AND MONONUCLEAR NO ORGANISMS SEEN    Culture   Final    NO GROWTH 2 DAYS NO ANAEROBES ISOLATED; CULTURE IN PROGRESS FOR 5 DAYS Performed at [facility], [place] [address], [place], [place] [address]    Report Status PENDING  Incomplete    Coagulation Studies: Recent Labs    11/24/19 1437 11/25/19 0830  LABPROT 14.9 16.5*  INR 1.2 1.3*    Urinalysis: No results for input(s): COLORURINE, LABSPEC, PHURINE, GLUCOSEU, HGBUR, BILIRUBINUR, KETONESUR, PROTEINUR, UROBILINOGEN, NITRITE, LEUKOCYTESUR in the last 72 hours.  Invalid input(s): APPERANCEUR    Imaging: US Venous Img Lower Bilateral (DVT)  Result Date: 11/25/2019 CLINICAL DATA:  Leg pain, swelling EXAM: BILATERAL LOWER EXTREMITY VENOUS DOPPLER ULTRASOUND TECHNIQUE: Gray-scale sonography with compression, as well as color and duplex ultrasound, were performed to evaluate the deep venous system(s) from the level of the common femoral vein through the popliteal and proximal calf veins. COMPARISON:  None. FINDINGS: VENOUS Normal compressibility of the common femoral, superficial femoral, and popliteal veins, as well as the visualized calf veins. Visualized portions  of profunda femoral vein and great saphenous vein unremarkable. No filling defects to suggest DVT on grayscale or color Doppler imaging. Doppler waveforms show normal direction of venous flow, normal respiratory phasicity and response to augmentation. OTHER None. Limitations: none IMPRESSION: No evidence of lower extremity DVT. Electronically Signed   By: Rolm Baptise M.D.   On: 11/25/2019 16:57     Medications:   . sodium chloride Stopped (11/26/19 2246)  . piperacillin-tazobactam (ZOSYN)  IV 3.375 g (11/27/19 1000)  . vancomycin Stopped (11/25/19 2346)   . amLODipine  10 mg Oral Daily  . vitamin C  500 mg Oral BID  . aspirin EC  81 mg Oral Daily  . atorvastatin  20 mg Oral Daily  . carvedilol  6.25 mg Oral BID  . Chlorhexidine Gluconate Cloth  6 each Topical Q0600  . citalopram  20 mg Oral Daily  . clopidogrel  75 mg Oral Daily  . [START ON 11/28/2019] epoetin (EPOGEN/PROCRIT) injection  4,000 Units Intravenous Q M,W,F-HD  . feeding supplement (PRO-STAT SUGAR FREE 64)  30 mL Oral BID  . FLUoxetine  20 mg Oral QHS  . fluticasone  2 spray Each Nare Daily  . gabapentin  100 mg Oral BID  . gabapentin  200 mg Oral QHS  . hydrALAZINE  10 mg Oral TID  . insulin aspart  0-6 Units Subcutaneous TID WC  . lipase/protease/amylase  36,000 Units Oral TID WC  . meclizine  12.5 mg Oral BID  . multivitamin  1 tablet Oral Daily  . pantoprazole  40 mg Oral Daily  . sevelamer carbonate  2,400 mg Oral TID WC  . sodium chloride flush  10-40 mL Intracatheter Q12H  . sodium chloride flush  3 mL Intravenous Q12H  . cyanocobalamin  1,000 mcg Oral Daily   sodium chloride, acetaminophen **OR** acetaminophen, albuterol, hydrALAZINE, LORazepam, nitroGLYCERIN, oxyCODONE, sodium chloride flush  Assessment/ Plan:  59 y.o. female with end stage renal disease on hemodialysis for last fifteen years, pancreatic insufficiency, hypertension, peripheral vascular disease, GERD, diabetes mellitus type II,  COPD  Principal Problem:   Hematoma of arm, left, subsequent encounter Active Problems:   End-stage renal disease on hemodialysis ([place])  [facility] MWF [facility]   #. ESRD No urgent indication for dialysis today.  We will plan for hemodialysis again tomorrow.  #. Anemia of CKD  Lab Results  Component Value Date   HGB 8.6 (L) 11/26/2019   Maintain the patient on Epogen 4000 IV with dialysis treatments.  #. SHPTH     Component Value Date/Time   PTH 357 (H) 10/05/2018 1241   Lab Results  Component Value Date   PHOS 2.2 (L) 11/18/2019   Recheck serum phosphorus tomorrow.  #. Diabetes type 2 with CKD Hemoglobin A1C (%)  Date Value  07/19/2013 9.3 (H)   Hgb A1c MFr Bld (%)  Date Value  11/15/2019 4.7 (L)   Glycemic control as per hospitalist.   LOS: 3 Elisandra Deshmukh 3/7/202112:22 PM  [facility] [place], [place]

## 2019-11-28 LAB — RESPIRATORY PANEL BY RT PCR (FLU A&B, COVID)
Influenza A by PCR: NEGATIVE
Influenza B by PCR: NEGATIVE
SARS Coronavirus 2 by RT PCR: NEGATIVE

## 2019-11-28 LAB — GLUCOSE, CAPILLARY
Glucose-Capillary: 105 mg/dL — ABNORMAL HIGH (ref 70–99)
Glucose-Capillary: 70 mg/dL (ref 70–99)
Glucose-Capillary: 88 mg/dL (ref 70–99)
Glucose-Capillary: 105 mg/dL — ABNORMAL HIGH (ref 70–99)

## 2019-11-28 LAB — VANCOMYCIN, RANDOM: Vancomycin Rm: 22

## 2019-11-28 LAB — PHOSPHORUS: Phosphorus: 2.8 mg/dL (ref 2.5–4.6)

## 2019-11-28 MED ORDER — PROMETHAZINE HCL 25 MG PO TABS
25.0000 mg | ORAL_TABLET | Freq: Three times a day (TID) | ORAL | Status: DC | PRN
  Administered 2019-11-28 – 2019-11-29 (×3): 25 mg via ORAL
  Filled 2019-11-28 (×5): qty 1

## 2019-11-28 MED ORDER — DIPHENHYDRAMINE HCL 25 MG PO CAPS: 25.0000 mg | ORAL_CAPSULE | Freq: Four times a day (QID) | ORAL | Status: DC | PRN

## 2019-11-28 NOTE — Progress Notes (Signed)
Pharmacy Antibiotic Note  [name] is a 59 y.o. female admitted on 11/24/2019 with wound infection.  Pharmacy has been consulted for Vancomycin , Zosyn dosing.  Pt on HD every MWF.   Plan: Continue Zosyn 3.375 g IV Q12H EI   Vancomycin 2 gm IV X 1 given Continue Vancomycin 1 g IV Q MWF  Vancomycin random at 22. Goal range is 15-25 for HD pt per [facility] health policy.   Today is day 5 of total abx. Will recommend to potentially d/c abx as culture have remain negative for 3 days.   Height: 5\' 5"  (165.1 cm) Weight: 194 lb (88 kg) IBW/kg (Calculated) : 57  Temp (24hrs), Avg:97.8 F (36.6 C), Min:97.5 F (36.4 C), Max:98 F (36.7 C)  Recent Labs  Lab 11/24/19 1437 11/25/19 0830 11/26/19 0426 11/28/19 0520  WBC 7.9 8.5 5.7  --   CREATININE 3.49* 4.06* 2.89*  --   VANCORANDOM  --   --   --  22    Estimated Creatinine Clearance: 22.7 mL/min (A) (by C-G formula based on SCr of 2.89 mg/dL (H)).    Allergies  Allergen Reactions  . Ace Inhibitors Swelling and Anaphylaxis  . Ativan [Lorazepam] Other (See Comments)    Reaction:Hallucinations and headaches  . Compazine [Prochlorperazine Edisylate] Anaphylaxis, Nausea And Vomiting and Other (See Comments)    Other reaction(s): dystonia from this vs. Reglan, 23 Jul - patient relates that she takes promethazine frequently with no problems  . Sumatriptan Succinate Other (See Comments)    Other reaction(s): delirium and hallucinations per [facility] records  . Dilaudid [Hydromorphone Hcl] Other (See Comments)    Delirium   . Ondansetron Other (See Comments)    hallucinations    . Zofran [Ondansetron Hcl] Other (See Comments)    Reaction:  hallucinations   . Dilaudid [Hydromorphone]   . Zofran [Ondansetron]     Per pt. she is allergic to zofran or will experience adverse reaction like hallucination   . Codeine Nausea And Vomiting  . Gabapentin Other (See Comments)    Reaction:  Unknown    . [place] Nausea And Vomiting  . Losartan  Nausea Only  . Oxycodone Anxiety  . Prochlorperazine Other (See Comments)    Reaction:  Unknown . Patient does not remember reaction but she does have vertigo and anxiety along with n and v at times. Could be used to treat any of these   . Reglan [Metoclopramide] Other (See Comments)    Per patient her Dr. Evelina Bucy her off it   . Scopolamine Other (See Comments)    Dizziness, also has vertigo already  . Tape Rash  . Tapentadol Rash    Antimicrobials this admission: Vanc/Zosyn 3/4 >>  Dose adjustments this admission:   Microbiology results: 3/5 WCx (OR) collected 3/4 BCx x2 NGTD MRSA PCR neg  Thank you for allowing pharmacy to be a part of this patient's care.  Oswald Hillock 11/28/2019 12:32 PM

## 2019-11-28 NOTE — TOC Initial Note (Signed)
Transition of Care [facility]) - Initial/Assessment Note    Patient Details  Name: [name] MRN: [id number] Date of Birth: 1961-09-17  Transition of Care [facility]) CM/SW Contact:    Eileen Stanford, LCSW Phone Number: 11/28/2019, 3:14 PM  Clinical Narrative:      Pt is a resident at [facility]. Pt will return at d/c.   Facility has ordered wound vac however, facility had to order--should arrive in the morning of 3/9. COVID test ordered, will be good for d/c tomorrow. Still awaiting auth. MD notified.             Expected Discharge Plan: Skilled Nursing Facility Barriers to Discharge: Insurance Authorization   Patient Goals and CMS Choice Patient states their goals for this hospitalization and ongoing recovery are:: to return to snf      Expected Discharge Plan and Services Expected Discharge Plan: [place] In-house Referral: NA   Post Acute Care Choice: [place] Living arrangements for the past 2 months: Single Family Home                                      Prior Living Arrangements/Services Living arrangements for the past 2 months: Single Family Home Lives with:: Self Patient language and need for interpreter reviewed:: Yes Do you feel safe going back to the place where you live?: Yes      Need for Family Participation in Patient Care: Yes (Comment) Care giver support system in place?: Yes (comment)   Criminal Activity/Legal Involvement Pertinent to Current Situation/Hospitalization: No - Comment as needed  Activities of Daily Living Home Assistive Devices/Equipment: Wheelchair, Environmental consultant (specify type), Dentures (specify type) ADL Screening (condition at time of admission) Patient's cognitive ability adequate to safely complete daily activities?: Yes Is the patient deaf or have difficulty hearing?: No Does the patient have difficulty seeing, even when wearing glasses/contacts?: No Does the patient have difficulty concentrating,  remembering, or making decisions?: No Patient able to express need for assistance with ADLs?: Yes Does the patient have difficulty dressing or bathing?: Yes Independently performs ADLs?: No Communication: Independent Dressing (OT): Needs assistance Is this a change from baseline?: Change from baseline, expected to last >3 days Grooming: Needs assistance Is this a change from baseline?: Change from baseline, expected to last >3 days Feeding: Needs assistance Is this a change from baseline?: Change from baseline, expected to last >3 days Bathing: Needs assistance Is this a change from baseline?: Change from baseline, expected to last >3 days Toileting: Needs assistance Is this a change from baseline?: Change from baseline, expected to last >3days In/Out Bed: Needs assistance Is this a change from baseline?: Change from baseline, expected to last >3 days Walks in Home: Needs assistance Is this a change from baseline?: Change from baseline, expected to last >3 days Does the patient have difficulty walking or climbing stairs?: Yes Weakness of Legs: Both Weakness of Arms/Hands: Both  Permission Sought/Granted Permission sought to share information with : Family Supports    Share Information with NAME: carl     Permission granted to share info w Relationship: son     Emotional Assessment Appearance:: Appears stated age     Orientation: : Oriented to Self, Oriented to Place, Oriented to  Time, Oriented to Situation Alcohol / Substance Use: Not Applicable Psych Involvement: No (comment)  Admission diagnosis:  Infective myositis of left upper arm [M60.022] Left arm  swelling [M79.89] Hematoma of arm, left, subsequent encounter [S40.022D] Patient Active Problem List   Diagnosis Date Noted  . Hematoma of arm, left, subsequent encounter 11/24/2019  . Respiratory failure ([place])   . Shortness of breath   . Complication of arteriovenous dialysis fistula 11/13/2019  . PVD (peripheral  vascular disease) ([place]) 11/13/2019  . Severe anemia 11/13/2019  . Pressure injury of skin 11/13/2019  . ESRD on hemodialysis ([place])   . Hemorrhagic shock ([place]) 11/11/2019  . Leg pain 10/02/2019  . CAP (community acquired pneumonia) 10/01/2019  . Atherosclerosis of native arteries of extremity with intermittent claudication ([place]) 05/01/2019  . Right-sided headache   . COPD (chronic obstructive pulmonary disease) ([place]) 10/06/2018  . Syncope 10/04/2018  . Symptomatic anemia 06/18/2018  . Gastroparesis due to DM ([place]) 01/18/2018  . Complication of vascular access for dialysis 12/04/2017  . Osteomyelitis ([place]) 09/30/2017  . Carotid stenosis 06/18/2017  . Shortness of breath 05/04/2017  . Cellulitis of lower extremity 07/29/2016  . Chronic venous insufficiency 07/29/2016  . Lymphedema 07/29/2016  . TIA (transient ischemic attack) 04/21/2016  . Altered mental status 04/08/2016  . Hyperammonemia ([place]) 04/08/2016  . Elevated troponin 04/08/2016  . Depression 04/08/2016  . Depression, major, recurrent, severe with psychosis ([place]) 04/08/2016  . Blood in stool   . Intractable cyclical vomiting with nausea   . Reflux esophagitis   . Gastritis   . Generalized abdominal pain   . Uncontrollable vomiting   . Major depressive disorder, recurrent episode, moderate ([place]) 03/15/2016  . Adjustment disorder with mixed anxiety and depressed mood 03/15/2016  . Malnutrition of moderate degree 12/01/2015  . Renal mass   . Dyspnea   . Acute renal failure ([place])   . Respiratory failure ([place])   . High temperature 11/14/2015  . Pulmonary edema   . Encounter for central line placement   . Encounter for orogastric (OG) tube placement   . Nausea 11/12/2015  . Hyperkalemia 10/03/2015  . Diarrhea, unspecified 07/22/2015  . Pneumonia 05/21/2015  . Hypoglycemia 04/24/2015  . Unresponsiveness 04/24/2015  . Bradycardia 04/24/2015  . Hypothermia 04/24/2015  . Acute respiratory failure ([place]) 04/24/2015  .  Acute diastolic CHF (congestive heart failure) ([place]) 04/05/2015  . Diabetic gastroparesis ([place]) 04/05/2015  . Hypokalemia 04/05/2015  . Generalized weakness 04/05/2015  . Acute pulmonary edema ([place]) 04/03/2015  . Nausea and vomiting 04/03/2015  . Hypoglycemia associated with diabetes ([place]) 04/03/2015  . Anemia of chronic disease 04/03/2015  . Secondary hyperparathyroidism ([place]) 04/03/2015  . Pressure ulcer 04/02/2015  . Acute respiratory failure with hypoxia ([place]) 04/01/2015  . Adjustment disorder with anxiety 03/14/2015  . Somatic symptom disorder, mild 03/08/2015  . Coronary artery disease involving native coronary artery of native heart without angina pectoris   . Nausea & vomiting 03/06/2015  . Abdominal pain 03/06/2015  . DM (diabetes mellitus) ([place]) 03/06/2015  . HTN (hypertension) 03/06/2015  . Gastroparesis 02/24/2015  . Pleural effusion 02/19/2015  . HCAP (healthcare-associated pneumonia) 02/19/2015  . End-stage renal disease on hemodialysis ([place]) 02/19/2015   PCP:  Freddy Finner, NP Pharmacy:   [facility], [place] [address] Phone: [phone] Fax: [phone]  [id number] - [address] [place] [address] Phone: [phone] Fax: [phone]     Social Determinants of Health (SDOH) Interventions    Readmission Risk Interventions No flowsheet data found.

## 2019-11-28 NOTE — Progress Notes (Signed)
[name], [name] 11/28/19  Subjective:  Patient seen and evaluated during HD.  Tolerating well.  BFR 400, UF target 2kg.     Objective:  Vital signs in last 24 hours:  Temp:  [97.5 F (36.4 C)-98 F (36.7 C)] 97.8 F (36.6 C) (03/08 0733) Pulse Rate:  [57-59] 58 (03/08 0733) Resp:  [18-20] 18 (03/08 0733) BP: (153-161)/(57-61) 153/57 (03/08 0733) SpO2:  [97 %-100 %] 97 % (03/08 0733) Weight:  [88 kg] 88 kg (03/08 0507)  Weight change: 2.495 kg Filed Weights   11/26/19 0504 11/27/19 0515 11/28/19 0507  Weight: 87.8 kg 85.5 kg 88 kg    Intake/Output:    Intake/Output Summary (Last 24 hours) at 11/28/2019 6644 Last data filed at 11/28/2019 0646 Gross per 24 hour  Intake 220.45 ml  Output 50 ml  Net 170.45 ml     Physical Exam: General:  No acute distress  HEENT  normocephalic atraumatic hearing intact  Pulm/lungs  clear to auscultation bilateral, normal effort  CVS/Heart  regular, no rubs  Abdomen:   Soft, nontender, nondistended, bowel sounds present  Extremities:  Trace lower extremity edema, mild swelling in left upper   extremity.  Neurologic:  Awake, alert, follows commands  Skin:  Warm, dry  Access:  IJ PermCath.  Wound VAC in old access in left upper extremity.       Basic Metabolic Panel:  Recent Labs  Lab 11/21/19 1100 11/21/19 1100 11/24/19 1437 11/25/19 0830 11/26/19 0426  NA 133*  --  131* 132* 132*  K 3.6  --  3.3* 3.6 3.8  CL 90*  --  95* 97* 96*  CO2 33*  --  29 23 28   GLUCOSE 89  --  106* 71 235*  BUN 15  --  5* 10 7  CREATININE 5.90*  --  3.49* 4.06* 2.89*  CALCIUM 8.3*   < > 8.1* 7.9* 8.0*  MG  --   --   --  1.8 1.8   < > = values in this interval not displayed.     CBC: Recent Labs  Lab 11/21/19 1100 11/24/19 1437 11/25/19 0830 11/26/19 0426  WBC 7.0 7.9 8.5 5.7  NEUTROABS 3.9 3.6  --   --   HGB 10.4* 10.2* 10.0* 8.6*  HCT 32.4* 32.1* 31.7* 26.4*  MCV 85.3 87.5 88.3 85.4  PLT 247 209 212  203      Lab Results  Component Value Date   HEPBSAG Negative (A) 11/13/2019   HEPBIGM  06/12/2010    NEGATIVE (NOTE) High levels of Hepatitis B Core IgM antibody are detectable during the acute stage of Hepatitis B. This antibody is used to differentiate current from past HBV infection.       Microbiology:  Recent Results (from the past 240 hour(s))  Respiratory Panel by RT PCR (Flu A&B, Covid) - Nasopharyngeal Swab     Status: None   Collection Time: 11/24/19  6:19 PM   Specimen: Nasopharyngeal Swab  Result Value Ref Range Status   SARS Coronavirus 2 by RT PCR NEGATIVE NEGATIVE Final    Comment: (NOTE) SARS-CoV-2 target nucleic acids are NOT DETECTED. The SARS-CoV-2 RNA is generally detectable in upper respiratoy specimens during the acute phase of infection. The lowest concentration of SARS-CoV-2 viral copies this assay can detect is 131 copies/mL. A negative result does not preclude SARS-Cov-2 infection and should not be used as the sole basis for treatment or other patient management decisions. A negative result may  occur with  improper specimen collection/handling, submission of specimen other than nasopharyngeal swab, presence of viral mutation(s) within the areas targeted by this assay, and inadequate number of viral copies (<131 copies/mL). A negative result must be combined with clinical observations, patient history, and epidemiological information. The expected result is Negative. Fact Sheet for Patients:  PinkCheek.be Fact Sheet for Healthcare Providers:  GravelBags.it This test is not yet ap proved or cleared by the [place] FDA and  has been authorized for detection and/or diagnosis of SARS-CoV-2 by FDA under an Emergency Use Authorization (EUA). This EUA will remain  in effect (meaning this test can be used) for the duration of the COVID-19 declaration under Section 564(b)(1) of the Act, 21  U.S.C. section 360bbb-3(b)(1), unless the authorization is terminated or revoked sooner.    Influenza A by PCR NEGATIVE NEGATIVE Final   Influenza B by PCR NEGATIVE NEGATIVE Final    Comment: (NOTE) The Xpert Xpress SARS-CoV-2/FLU/RSV assay is intended as an aid in  the diagnosis of influenza from Nasopharyngeal swab specimens and  should not be used as a sole basis for treatment. Nasal washings and  aspirates are unacceptable for Xpert Xpress SARS-CoV-2/FLU/RSV  testing. Fact Sheet for Patients: PinkCheek.be Fact Sheet for Healthcare Providers: GravelBags.it This test is not yet approved or cleared by the [place] FDA and  has been authorized for detection and/or diagnosis of SARS-CoV-2 by  FDA under an Emergency Use Authorization (EUA). This EUA will remain  in effect (meaning this test can be used) for the duration of the  Covid-19 declaration under Section 564(b)(1) of the Act, 21  U.S.C. section 360bbb-3(b)(1), unless the authorization is  terminated or revoked. Performed at [facility], [address] [place], [place] [address]   Blood culture (routine x 2)     Status: None (Preliminary result)   Collection Time: 11/24/19  7:46 PM   Specimen: BLOOD  Result Value Ref Range Status   Specimen Description BLOOD LEFT JUGULAR  Final   Special Requests   Final    BOTTLES DRAWN AEROBIC AND ANAEROBIC Blood Culture results may not be optimal due to an inadequate volume of blood received in culture bottles   Culture   Final    NO GROWTH 4 DAYS Performed at [facility], [address], [place], [place] [address]    Report Status PENDING  Incomplete  Blood culture (routine x 2)     Status: None (Preliminary result)   Collection Time: 11/24/19  7:46 PM   Specimen: BLOOD  Result Value Ref Range Status   Specimen Description BLOOD LEFT JUGULAR  Final   Special Requests   Final    BOTTLES DRAWN  AEROBIC AND ANAEROBIC Blood Culture adequate volume   Culture   Final    NO GROWTH 4 DAYS Performed at [facility], [address], [place], [place] [address]    Report Status PENDING  Incomplete  MRSA PCR Screening     Status: None   Collection Time: 11/24/19  9:02 PM   Specimen: Nasal Mucosa; Nasopharyngeal  Result Value Ref Range Status   MRSA by PCR NEGATIVE NEGATIVE Final    Comment:        The GeneXpert MRSA Assay (FDA approved for NASAL specimens only), is one component of a comprehensive MRSA colonization surveillance program. It is not intended to diagnose MRSA infection nor to guide or monitor treatment for MRSA infections. Performed at [facility], [place], [name]  [address]   Aerobic/Anaerobic Culture (surgical/deep wound)     Status: None (Preliminary result)   Collection Time: 11/25/19 11:08 AM   Specimen: PATH Other; Tissue  Result Value Ref Range Status   Specimen Description   Final    WOUND LEFT AXILLA Performed at [facility], [address], [place], [place] [address]    Special Requests   Final    NONE Performed at [facility], [place], [place] [address]    Gram Stain   Final    RARE WBC PRESENT,BOTH PMN AND MONONUCLEAR NO ORGANISMS SEEN    Culture   Final    NO GROWTH 2 DAYS NO ANAEROBES ISOLATED; CULTURE IN PROGRESS FOR 5 DAYS Performed at [facility], [place] [address] [place], [place] [address]    Report Status PENDING  Incomplete    Coagulation Studies: No results for input(s): LABPROT, INR in the last 72 hours.  Urinalysis: No results for input(s): COLORURINE, LABSPEC, PHURINE, GLUCOSEU, HGBUR, BILIRUBINUR, KETONESUR, PROTEINUR, UROBILINOGEN, NITRITE, LEUKOCYTESUR in the last 72 hours.  Invalid input(s): APPERANCEUR    Imaging: No results found.   Medications:   . sodium chloride Stopped (11/26/19 2149)  . piperacillin-tazobactam (ZOSYN)  IV Stopped  (11/28/19 0204)  . vancomycin Stopped (11/25/19 2346)   . amLODipine  10 mg Oral Daily  . vitamin C  500 mg Oral BID  . aspirin EC  81 mg Oral Daily  . atorvastatin  20 mg Oral Daily  . carvedilol  6.25 mg Oral BID  . Chlorhexidine Gluconate Cloth  6 each Topical Q0600  . citalopram  20 mg Oral Daily  . clopidogrel  75 mg Oral Daily  . epoetin (EPOGEN/PROCRIT) injection  4,000 Units Intravenous Q M,W,F-HD  . feeding supplement (PRO-STAT SUGAR FREE 64)  30 mL Oral BID  . FLUoxetine  20 mg Oral QHS  . fluticasone  2 spray Each Nare Daily  . gabapentin  100 mg Oral BID  . gabapentin  200 mg Oral QHS  . hydrALAZINE  10 mg Oral TID  . insulin aspart  0-6 Units Subcutaneous TID WC  . lipase/protease/amylase  36,000 Units Oral TID WC  . meclizine  12.5 mg Oral BID  . multivitamin  1 tablet Oral Daily  . pantoprazole  40 mg Oral Daily  . sevelamer carbonate  2,400 mg Oral TID WC  . sodium chloride flush  10-40 mL Intracatheter Q12H  . sodium chloride flush  3 mL Intravenous Q12H  . cyanocobalamin  1,000 mcg Oral Daily   sodium chloride, acetaminophen **OR** acetaminophen, albuterol, diphenhydrAMINE, hydrALAZINE, LORazepam, nitroGLYCERIN, oxyCODONE, sodium chloride flush  Assessment/ Plan:  59 y.o. female with end stage renal disease on hemodialysis for last fifteen years, pancreatic insufficiency, hypertension, peripheral vascular disease, GERD, diabetes mellitus type II, COPD  Principal Problem:   Hematoma of arm, left, subsequent encounter Active Problems:   End-stage renal disease on hemodialysis ([place])  [facility] MWF [facility]   #. ESRD Seen and evaluated during HD, BFR 400, UF target 2kg.   #. Anemia of CKD  Lab Results  Component Value Date   HGB 8.6 (L) 11/26/2019   Administer epogen 4000 units IV today.   #. SHPTH     Component Value Date/Time   PTH 357 (H) 10/05/2018 1241   Lab Results  Component Value Date   PHOS 2.2 (L) 11/18/2019   Check serum  phosphorous today.   #. Diabetes type 2 with CKD Hemoglobin  A1C (%)  Date Value  07/19/2013 9.3 (H)   Hgb A1c MFr Bld (%)  Date Value  11/15/2019 4.7 (L)   Glycemic control as per hospitalist.   LOS: 4 [name] 3/8/20219:27 [place], [place]

## 2019-11-28 NOTE — Care Management Important Message (Signed)
Important Message  Patient Details  Name: [name] MRN: [id number] Date of Birth: 1960-11-09   Medicare Important Message Given:  Yes     Dannette Barbara 11/28/2019, 11:35 AM

## 2019-11-28 NOTE — Consult Note (Addendum)
WOC consult requested for Vac dressing change this morning with Vascular team present; secure chat message sent to the  Vascular service PA to determine time to meet and perform dressing change.  Please contact me at [phone].   [address] MSN, [place], [place], [place], [place]

## 2019-11-28 NOTE — NC FL2 (Signed)
[place] LEVEL OF CARE SCREENING TOOL     IDENTIFICATION  Patient Name: [name] Birthdate: [birth year] Sex: female Admission Date (Current Location): 11/24/2019  [place] and [place] Number:  Engineering geologist and Address:  [facility], [address], [place], [place] [address]      Provider Number: [id number]  Attending Physician Name and Address:  Jennye Boroughs, MD  Relative Name and Phone Number:       Current Level of Care: Hospital Recommended Level of Care: [place] Prior Approval Number:    Date Approved/Denied:   PASRR Number: [id number]  Discharge Plan: SNF    Current Diagnoses: Patient Active Problem List   Diagnosis Date Noted  . Hematoma of arm, left, subsequent encounter 11/24/2019  . Respiratory failure ([place])   . Shortness of breath   . Complication of arteriovenous dialysis fistula 11/13/2019  . PVD (peripheral vascular disease) ([place]) 11/13/2019  . Severe anemia 11/13/2019  . Pressure injury of skin 11/13/2019  . ESRD on hemodialysis ([place])   . Hemorrhagic shock ([place]) 11/11/2019  . Leg pain 10/02/2019  . CAP (community acquired pneumonia) 10/01/2019  . Atherosclerosis of native arteries of extremity with intermittent claudication ([place]) 05/01/2019  . Right-sided headache   . COPD (chronic obstructive pulmonary disease) ([place]) 10/06/2018  . Syncope 10/04/2018  . Symptomatic anemia 06/18/2018  . Gastroparesis due to DM ([place]) 01/18/2018  . Complication of vascular access for dialysis 12/04/2017  . Osteomyelitis ([place]) 09/30/2017  . Carotid stenosis 06/18/2017  . Shortness of breath 05/04/2017  . Cellulitis of lower extremity 07/29/2016  . Chronic venous insufficiency 07/29/2016  . Lymphedema 07/29/2016  . TIA (transient ischemic attack) 04/21/2016  . Altered mental status 04/08/2016  . Hyperammonemia ([place]) 04/08/2016  . Elevated troponin 04/08/2016  . Depression 04/08/2016  . Depression,  major, recurrent, severe with psychosis ([place]) 04/08/2016  . Blood in stool   . Intractable cyclical vomiting with nausea   . Reflux esophagitis   . Gastritis   . Generalized abdominal pain   . Uncontrollable vomiting   . Major depressive disorder, recurrent episode, moderate ([place]) 03/15/2016  . Adjustment disorder with mixed anxiety and depressed mood 03/15/2016  . Malnutrition of moderate degree 12/01/2015  . Renal mass   . Dyspnea   . Acute renal failure ([place])   . Respiratory failure ([place])   . High temperature 11/14/2015  . Pulmonary edema   . Encounter for central line placement   . Encounter for orogastric (OG) tube placement   . Nausea 11/12/2015  . Hyperkalemia 10/03/2015  . Diarrhea, unspecified 07/22/2015  . Pneumonia 05/21/2015  . Hypoglycemia 04/24/2015  . Unresponsiveness 04/24/2015  . Bradycardia 04/24/2015  . Hypothermia 04/24/2015  . Acute respiratory failure ([place]) 04/24/2015  . Acute diastolic CHF (congestive heart failure) ([place]) 04/05/2015  . Diabetic gastroparesis ([place]) 04/05/2015  . Hypokalemia 04/05/2015  . Generalized weakness 04/05/2015  . Acute pulmonary edema ([place]) 04/03/2015  . Nausea and vomiting 04/03/2015  . Hypoglycemia associated with diabetes ([place]) 04/03/2015  . Anemia of chronic disease 04/03/2015  . Secondary hyperparathyroidism ([place]) 04/03/2015  . Pressure ulcer 04/02/2015  . Acute respiratory failure with hypoxia ([place]) 04/01/2015  . Adjustment disorder with anxiety 03/14/2015  . Somatic symptom disorder, mild 03/08/2015  . Coronary artery disease involving native coronary artery of native heart without angina pectoris   . Nausea & vomiting 03/06/2015  . Abdominal pain 03/06/2015  . DM (diabetes mellitus) ([place]) 03/06/2015  . HTN (hypertension)  03/06/2015  . Gastroparesis 02/24/2015  . Pleural effusion 02/19/2015  . HCAP (healthcare-associated pneumonia) 02/19/2015  . End-stage renal disease on hemodialysis ([place]) 02/19/2015     Orientation RESPIRATION BLADDER Height & Weight     Self, Situation, Time, Place  Normal Continent Weight: 194 lb (88 kg) Height:  5\' 5"  (165.1 cm)  BEHAVIORAL SYMPTOMS/MOOD NEUROLOGICAL BOWEL NUTRITION STATUS      Continent Diet(renal with fluid restriction Fluid restriction: 1200 mL Fluid)  AMBULATORY STATUS COMMUNICATION OF NEEDS Skin   Limited Assist Verbally Wound Vac(left arm wound, wound vac applied)                       Personal Care Assistance Level of Assistance  Bathing, Feeding, Dressing Bathing Assistance: Limited assistance Feeding assistance: Independent Dressing Assistance: Limited assistance     Functional Limitations Info  Sight, Hearing, Speech Sight Info: Adequate Hearing Info: Adequate Speech Info: Adequate    SPECIAL CARE FACTORS FREQUENCY  PT (By licensed PT), OT (By licensed OT)     PT Frequency: 5x OT Frequency: 5x            Contractures Contractures Info: Not present    Additional Factors Info  Code Status, Allergies Code Status Info: Full Allergies Info: Ace Inhibitors, Ativan (Lorazepam), Compazine (Prochlorperazine Edisylate), Sumatriptan Succinate, Dilaudid (Hydromorphone Hcl), Ondansetron, Zofran (Ondansetron Hcl), Dilaudid (Hydromorphone), Zofran (Ondansetron), Codeine, Gabapentin, [place], Losartan, Oxycodone, Prochlorperazine, Reglan (Metoclopramide), Scopolamine, Tape, Tapentadol           Current Medications (11/28/2019):  This is the current hospital active medication list Current Facility-Administered Medications  Medication Dose Route Frequency Provider Last Rate Last Admin  . 0.9 %  sodium chloride infusion   Intravenous PRN Schnier, Dolores Lory, MD   Stopped at 11/26/19 2149  . acetaminophen (TYLENOL) tablet 650 mg  650 mg Oral Q6H PRN Schnier, Dolores Lory, MD       Or  . acetaminophen (TYLENOL) suppository 650 mg  650 mg Rectal Q6H PRN Schnier, Dolores Lory, MD      . albuterol (PROVENTIL) (2.5 MG/3ML) 0.083%  nebulizer solution 2.5 mg  2.5 mg Nebulization Q4H PRN Schnier, Dolores Lory, MD      . amLODipine (NORVASC) tablet 10 mg  10 mg Oral Daily Schnier, Dolores Lory, MD   10 mg at 11/27/19 0939  . ascorbic acid (VITAMIN C) tablet 500 mg  500 mg Oral BID Delana Meyer Dolores Lory, MD   500 mg at 11/27/19 2049  . aspirin EC tablet 81 mg  81 mg Oral Daily Jennye Boroughs, MD   81 mg at 11/27/19 0939  . atorvastatin (LIPITOR) tablet 20 mg  20 mg Oral Daily Schnier, Dolores Lory, MD   20 mg at 11/27/19 0939  . carvedilol (COREG) tablet 6.25 mg  6.25 mg Oral BID Delana Meyer Dolores Lory, MD   6.25 mg at 11/27/19 2049  . Chlorhexidine Gluconate Cloth 2 % PADS 6 each  6 each Topical Q0600 Murlean Iba, MD   6 each at 11/28/19 [phone]  . citalopram (CELEXA) tablet 20 mg  20 mg Oral Daily Schnier, Dolores Lory, MD   20 mg at 11/27/19 0939  . clopidogrel (PLAVIX) tablet 75 mg  75 mg Oral Daily Jennye Boroughs, MD   75 mg at 11/27/19 0940  . diphenhydrAMINE (BENADRYL) capsule 25 mg  25 mg Oral Q6H PRN Lang Snow, NP      . epoetin alfa (EPOGEN) injection 4,000 Units  4,000 Units Intravenous Q M,W,F-HD  Murlean Iba, MD      . feeding supplement (PRO-STAT SUGAR FREE 64) liquid 30 mL  30 mL Oral BID Delana Meyer Dolores Lory, MD   30 mL at 11/26/19 2150  . FLUoxetine (PROZAC) capsule 20 mg  20 mg Oral QHS Schnier, Dolores Lory, MD   20 mg at 11/24/19 2147  . fluticasone (FLONASE) 50 MCG/ACT nasal spray 2 spray  2 spray Each Nare Daily Schnier, Dolores Lory, MD      . gabapentin (NEURONTIN) capsule 100 mg  100 mg Oral BID Schnier, Dolores Lory, MD   100 mg at 11/27/19 1540  . gabapentin (NEURONTIN) capsule 200 mg  200 mg Oral QHS Schnier, Dolores Lory, MD   200 mg at 11/27/19 2049  . hydrALAZINE (APRESOLINE) injection 10 mg  10 mg Intravenous Q6H PRN Schnier, Dolores Lory, MD      . hydrALAZINE (APRESOLINE) tablet 10 mg  10 mg Oral TID Delana Meyer Dolores Lory, MD   10 mg at 11/27/19 2050  . insulin aspart (novoLOG) injection 0-6 Units  0-6 Units  Subcutaneous TID WC Schnier, Dolores Lory, MD   1 Units at 11/26/19 1727  . lipase/protease/amylase (CREON) capsule 36,000 Units  36,000 Units Oral TID WC Schnier, Dolores Lory, MD   36,000 Units at 11/27/19 1738  . LORazepam (ATIVAN) tablet 0.5 mg  0.5 mg Oral Q6H PRN Schnier, Dolores Lory, MD   0.5 mg at 11/27/19 1534  . meclizine (ANTIVERT) tablet 12.5 mg  12.5 mg Oral BID Schnier, Dolores Lory, MD   12.5 mg at 11/27/19 2050  . multivitamin (RENA-VIT) tablet 1 tablet  1 tablet Oral Daily Schnier, Dolores Lory, MD   1 tablet at 11/27/19 0940  . nitroGLYCERIN (NITROSTAT) SL tablet 0.4 mg  0.4 mg Sublingual Q5 min PRN Schnier, Dolores Lory, MD      . oxyCODONE (Oxy IR/ROXICODONE) immediate release tablet 5 mg  5 mg Oral Q4H PRN Stegmayer, Kimberly A, PA-C   5 mg at 11/28/19 0029  . pantoprazole (PROTONIX) EC tablet 40 mg  40 mg Oral Daily Schnier, Dolores Lory, MD   40 mg at 11/27/19 0939  . piperacillin-tazobactam (ZOSYN) IVPB 3.375 g  3.375 g Intravenous Q12H Schnier, Dolores Lory, MD   Stopped at 11/28/19 0204  . sevelamer carbonate (RENVELA) tablet 2,400 mg  2,400 mg Oral TID WC Schnier, Dolores Lory, MD   2,400 mg at 11/27/19 1738  . sodium chloride flush (NS) 0.9 % injection 10-40 mL  10-40 mL Intracatheter Q12H Jennye Boroughs, MD   10 mL at 11/27/19 2105  . sodium chloride flush (NS) 0.9 % injection 10-40 mL  10-40 mL Intracatheter PRN Jennye Boroughs, MD      . sodium chloride flush (NS) 0.9 % injection 3 mL  3 mL Intravenous Q12H Schnier, Dolores Lory, MD   3 mL at 11/27/19 0941  . vancomycin (VANCOCIN) IVPB 1000 mg/200 mL premix  1,000 mg Intravenous Q M,W,F-HD Schnier, Dolores Lory, MD   Stopped at 11/25/19 2346  . vitamin B-12 (CYANOCOBALAMIN) tablet 1,000 mcg  1,000 mcg Oral Daily Schnier, Dolores Lory, MD   1,000 mcg at 11/27/19 4196     Discharge Medications: Please see discharge summary for a list of discharge medications.  Relevant Imaging Results:  Relevant Lab Results:   Additional  Information SSN:[id number]. HD [facility] MWF 11:00.  Augustina Braddock A Vermelle Cammarata, LCSW

## 2019-11-28 NOTE — Consult Note (Signed)
WOC Nurse Consult Note: Reason for Consult: Vac dressing change performed with vascular PA at the bedside to assess wound appearance. Wound type: Left arm with full thickness post-op wound Measurement: 10X4X3cm; deeper area at top of wound, lower wound is approx .3cm Wound bed: beefy red Drainage (amount, consistency, odor) small amt bloody drainage from upper deeper area of wound at 12:00 o'clock.  Mod amt in previous cannister and new cannister applied.  Periwound: Intact skin surrounding.  Staples in place and well approximated to left inner upper arm Dressing procedure/placement/frequency: Applied Mepitel contact layer over lower more shallow wound, and one piece black foam inserted deeper into top of wound and over lower wound. Pt tolerated with minimal amt discomfort.  Cont suction on at 192mm.  Gauze placed over staples and ace wrap applied to arm.  [place] team will plan to change dressing on Wed if patient is still in the hospital at that time.  Julien Girt MSN, RN, [place], [place], [place]

## 2019-11-28 NOTE — Progress Notes (Signed)
Pt is complaining of itching. Notify prime. Will continue to monitor.

## 2019-11-28 NOTE — Progress Notes (Addendum)
Progress Note    [name]  GEZ:[id number] DOB: 07-11-1961  DOA: 11/24/2019 PCP: Freddy Finner, NP      Brief Narrative:    Medical records reviewed and are as summarized below:  [name] is an 59 y.o. female with medical history significant for end stage renal disease on hemodialysis for last fifteen years,MWF, pancreatic insufficiency,chronic essential hypertension, peripheral vascular disease, GERD, diabetes mellitus type II, anemia of chronic kidney disease and COPD. Patient was recently on admission and discharged on 02/25 on account of hemorraghic shock as a result infected left AV fistula graft. Patient underwent emergent graft removal and currently has a perm cath on right anterior chest for dialysis. She is currently at the nursing home and receives wound dressing daily and has a wound vac.She returned to the ED on account of complaints of worsening left upper extremity swelling and pain prior 24 hours prior to admission.   She was found to have left axillary hematoma.   She was seen by vascular surgeon, Dr. Evelina Bucy, and she underwent incision and drainage of left axillary hematoma.  She was treated with empiric IV antibiotics.  Wound culture did not show any growth.  Wound VAC therapy was applied to her wound.  She will continue with wound VAC therapy and local wound care at the nursing home.  A right triple-lumen catheter was placed in the right femoral vein because of poor peripheral IV access.  She was seen by nephrologist for hemodialysis.     Assessment/Plan:   Principal Problem:   Hematoma of arm, left, subsequent encounter Active Problems:   End-stage renal disease on hemodialysis (HCC)   Left arm swelling/left upper extremity wound/left axillary hematoma:  S/P incision and drainage of left axillary hematoma by vascular surgeon on 11/25/2019.  Discontinue empiric antibiotics and local wound care.  No growth from blood and wound cultures thus far.  ESRD on  HD MWF: Right femoral triple-lumen catheter was placed by vascular surgeon 11/25/2019 because of poor peripheral IV access.  Follow-up with nephrologist. Scheduled for hemodialysis today via perm cath on right upper chest  Chronic diastolic CHF: Compensated  Chronic Essential Hypertension.  Continue antihypertensives.  Anemia of chronic kidney disease . Hemoglobin remains stable.   CAD/PVD: Continue aspirin Plavix.  Secondary hyperparathyroidism: Continue with home dose of sevelamer   Sinus bradycardia: Asymptomatic.  Heart rate is stable.        Body mass index is 32.28 kg/m.  (Obesity)   Family Communication/Anticipated D/C date and plan/Code Status   DVT prophylaxis: SCDs Code Status: Full code Family Communication: Plan discussed with patient Disposition Plan: Patient is from SNF and plan to discharge to SNF tomorrow.  She is stable for discharge but nursing home for has to order wound VAC prior to discharge.    Subjective:   No new complaints.  Left arm feels a little sore but swollen in her left upper extremity has improved significantly.  Objective:    Vitals:   11/27/19 1537 11/27/19 1919 11/28/19 0507 11/28/19 0733  BP: (!) 161/61 (!) 156/58 (!) 159/59 (!) 153/57  Pulse: (!) 58 (!) 59 (!) 57 (!) 58  Resp: 19 20 20 18   Temp: (!) 97.5 F (36.4 C) 97.7 F (36.5 C) 98 F (36.7 C) 97.8 F (36.6 C)  TempSrc: Oral Oral Oral Oral  SpO2: 98% 100% 100% 97%  Weight:   88 kg   Height:        Intake/Output Summary (Last 24  hours) at 11/28/2019 1055 Last data filed at 11/28/2019 0646 Gross per 24 hour  Intake 220.45 ml  Output 50 ml  Net 170.45 ml   Filed Weights   11/26/19 0504 11/27/19 0515 11/28/19 0507  Weight: 87.8 kg 85.5 kg 88 kg    Exam:  GEN: NAD SKIN: Wound vac on left arm wound. Right femoral catheter in place EYES: EOMI ENT: MMM CV: RRR PULM: clear to auscultation bilaterally ABD: soft, ND, NT, +BS CNS: AAO x 3, non focal EXT: Left arm  swelling and tenderness improved.    Data Reviewed:   I have personally reviewed following labs and imaging studies:  Labs: Labs show the following:   Basic Metabolic Panel: Recent Labs  Lab 11/21/19 1100 11/21/19 1100 11/24/19 1437 11/24/19 1437 11/25/19 0830 11/26/19 0426  NA 133*  --  131*  --  132* 132*  K 3.6   < > 3.3*   < > 3.6 3.8  CL 90*  --  95*  --  97* 96*  CO2 33*  --  29  --  23 28  GLUCOSE 89  --  106*  --  71 235*  BUN 15  --  5*  --  10 7  CREATININE 5.90*  --  3.49*  --  4.06* 2.89*  CALCIUM 8.3*  --  8.1*  --  7.9* 8.0*  MG  --   --   --   --  1.8 1.8   < > = values in this interval not displayed.   GFR Estimated Creatinine Clearance: 22.7 mL/min (A) (by C-G formula based on SCr of 2.89 mg/dL (H)). Liver Function Tests: Recent Labs  Lab 11/21/19 1100 11/24/19 1437  AST 19 22  ALT <5 <5  ALKPHOS 88 91  BILITOT 0.7 0.5  PROT 6.3* 6.2*  ALBUMIN 2.7* 2.8*   No results for input(s): LIPASE, AMYLASE in the last 168 hours. No results for input(s): AMMONIA in the last 168 hours. Coagulation profile Recent Labs  Lab 11/24/19 1437 11/25/19 0830  INR 1.2 1.3*    CBC: Recent Labs  Lab 11/21/19 1100 11/24/19 1437 11/25/19 0830 11/26/19 0426  WBC 7.0 7.9 8.5 5.7  NEUTROABS 3.9 3.6  --   --   HGB 10.4* 10.2* 10.0* 8.6*  HCT 32.4* 32.1* 31.7* 26.4*  MCV 85.3 87.5 88.3 85.4  PLT 247 209 212 203   Cardiac Enzymes: No results for input(s): CKTOTAL, CKMB, CKMBINDEX, TROPONINI in the last 168 hours. BNP (last 3 results) No results for input(s): PROBNP in the last 8760 hours. CBG: Recent Labs  Lab 11/27/19 0747 11/27/19 1151 11/27/19 1658 11/27/19 2050 11/28/19 0731  GLUCAP 100* 126* 118* 100* 105*   D-Dimer: No results for input(s): DDIMER in the last 72 hours. Hgb A1c: No results for input(s): HGBA1C in the last 72 hours. Lipid Profile: No results for input(s): CHOL, HDL, LDLCALC, TRIG, CHOLHDL, LDLDIRECT in the last 72  hours. Thyroid function studies: No results for input(s): TSH, T4TOTAL, T3FREE, THYROIDAB in the last 72 hours.  Invalid input(s): FREET3 Anemia work up: No results for input(s): VITAMINB12, FOLATE, FERRITIN, TIBC, IRON, RETICCTPCT in the last 72 hours. Sepsis Labs: Recent Labs  Lab 11/21/19 1100 11/24/19 1437 11/25/19 0830 11/26/19 0426  WBC 7.0 7.9 8.5 5.7    Microbiology Recent Results (from the past 240 hour(s))  Respiratory Panel by RT PCR (Flu A&B, Covid) - Nasopharyngeal Swab     Status: None   Collection Time: 11/24/19  6:19  PM   Specimen: Nasopharyngeal Swab  Result Value Ref Range Status   SARS Coronavirus 2 by RT PCR NEGATIVE NEGATIVE Final    Comment: (NOTE) SARS-CoV-2 target nucleic acids are NOT DETECTED. The SARS-CoV-2 RNA is generally detectable in upper respiratoy specimens during the acute phase of infection. The lowest concentration of SARS-CoV-2 viral copies this assay can detect is 131 copies/mL. A negative result does not preclude SARS-Cov-2 infection and should not be used as the sole basis for treatment or other patient management decisions. A negative result may occur with  improper specimen collection/handling, submission of specimen other than nasopharyngeal swab, presence of viral mutation(s) within the areas targeted by this assay, and inadequate number of viral copies (<131 copies/mL). A negative result must be combined with clinical observations, patient history, and epidemiological information. The expected result is Negative. Fact Sheet for Patients:  PinkCheek.be Fact Sheet for Healthcare Providers:  GravelBags.it This test is not yet ap proved or cleared by the [place] FDA and  has been authorized for detection and/or diagnosis of SARS-CoV-2 by FDA under an Emergency Use Authorization (EUA). This EUA will remain  in effect (meaning this test can be used) for the duration of  the COVID-19 declaration under Section 564(b)(1) of the Act, 21 U.S.C. section 360bbb-3(b)(1), unless the authorization is terminated or revoked sooner.    Influenza A by PCR NEGATIVE NEGATIVE Final   Influenza B by PCR NEGATIVE NEGATIVE Final    Comment: (NOTE) The Xpert Xpress SARS-CoV-2/FLU/RSV assay is intended as an aid in  the diagnosis of influenza from Nasopharyngeal swab specimens and  should not be used as a sole basis for treatment. Nasal washings and  aspirates are unacceptable for Xpert Xpress SARS-CoV-2/FLU/RSV  testing. Fact Sheet for Patients: PinkCheek.be Fact Sheet for Healthcare Providers: GravelBags.it This test is not yet approved or cleared by the [place] FDA and  has been authorized for detection and/or diagnosis of SARS-CoV-2 by  FDA under an Emergency Use Authorization (EUA). This EUA will remain  in effect (meaning this test can be used) for the duration of the  Covid-19 declaration under Section 564(b)(1) of the Act, 21  U.S.C. section 360bbb-3(b)(1), unless the authorization is  terminated or revoked. Performed at [facility], [address] [place], [place] [address]   Blood culture (routine x 2)     Status: None (Preliminary result)   Collection Time: 11/24/19  7:46 PM   Specimen: BLOOD  Result Value Ref Range Status   Specimen Description BLOOD LEFT JUGULAR  Final   Special Requests   Final    BOTTLES DRAWN AEROBIC AND ANAEROBIC Blood Culture results may not be optimal due to an inadequate volume of blood received in culture bottles   Culture   Final    NO GROWTH 4 DAYS Performed at [facility], [address], [place], [place] [address]    Report Status PENDING  Incomplete  Blood culture (routine x 2)     Status: None (Preliminary result)   Collection Time: 11/24/19  7:46 PM   Specimen: BLOOD  Result Value Ref Range Status   Specimen Description BLOOD LEFT  JUGULAR  Final   Special Requests   Final    BOTTLES DRAWN AEROBIC AND ANAEROBIC Blood Culture adequate volume   Culture   Final    NO GROWTH 4 DAYS Performed at [facility], [address], [place], [place] [address]    Report Status PENDING  Incomplete  MRSA PCR Screening  Status: None   Collection Time: 11/24/19  9:02 PM   Specimen: Nasal Mucosa; Nasopharyngeal  Result Value Ref Range Status   MRSA by PCR NEGATIVE NEGATIVE Final    Comment:        The GeneXpert MRSA Assay (FDA approved for NASAL specimens only), is one component of a comprehensive MRSA colonization surveillance program. It is not intended to diagnose MRSA infection nor to guide or monitor treatment for MRSA infections. Performed at [facility], [address] [place], [place] [address]   Aerobic/Anaerobic Culture (surgical/deep wound)     Status: None (Preliminary result)   Collection Time: 11/25/19 11:08 AM   Specimen: PATH Other; Tissue  Result Value Ref Range Status   Specimen Description   Final    WOUND LEFT AXILLA Performed at [facility], [address] [place], [place] [address]    Special Requests   Final    NONE Performed at [facility], [place],  [address]    Gram Stain   Final    RARE WBC PRESENT,BOTH PMN AND MONONUCLEAR NO ORGANISMS SEEN    Culture   Final    NO GROWTH 2 DAYS NO ANAEROBES ISOLATED; CULTURE IN PROGRESS FOR 5 DAYS Performed at [facility], [place] [address], [place],  [address]    Report Status PENDING  Incomplete    Procedures and diagnostic studies:  No results found.  Medications:   . amLODipine  10 mg Oral Daily  . vitamin C  500 mg Oral BID  . aspirin EC  81 mg Oral Daily  . atorvastatin  20 mg Oral Daily  . carvedilol  6.25 mg Oral BID  . Chlorhexidine Gluconate Cloth  6 each Topical Q0600  . citalopram  20 mg Oral Daily  . clopidogrel  75 mg Oral Daily  . epoetin  (EPOGEN/PROCRIT) injection  4,000 Units Intravenous Q M,W,F-HD  . feeding supplement (PRO-STAT SUGAR FREE 64)  30 mL Oral BID  . FLUoxetine  20 mg Oral QHS  . fluticasone  2 spray Each Nare Daily  . gabapentin  100 mg Oral BID  . gabapentin  200 mg Oral QHS  . hydrALAZINE  10 mg Oral TID  . insulin aspart  0-6 Units Subcutaneous TID WC  . lipase/protease/amylase  36,000 Units Oral TID WC  . meclizine  12.5 mg Oral BID  . multivitamin  1 tablet Oral Daily  . pantoprazole  40 mg Oral Daily  . sevelamer carbonate  2,400 mg Oral TID WC  . sodium chloride flush  10-40 mL Intracatheter Q12H  . sodium chloride flush  3 mL Intravenous Q12H  . cyanocobalamin  1,000 mcg Oral Daily   Continuous Infusions: . sodium chloride Stopped (11/26/19 2149)  . piperacillin-tazobactam (ZOSYN)  IV Stopped (11/28/19 0204)  . vancomycin Stopped (11/25/19 2346)     LOS: 4 days   October Peery  [facility]     11/28/2019, 10:55 AM

## 2019-11-28 NOTE — Progress Notes (Signed)
[name] MRN: [id number] DOB: 01/02/1961   Cancelled Treatment:    Reason Eval/Treat Not Completed: Other (comment). Pt currently out of room for HD. Will re-attempt another date.   Johanna Matto 11/28/2019, 9:59 AM [name], PT, DPT [phone]

## 2019-11-28 NOTE — Progress Notes (Addendum)
[name]   Subjective: 11/25/19:  1. Incision drainage left axillary hematoma with VAC placement 2. Insertion of triple-lumen central venous catheter right femoral approach.  Patient without complaint. No issues overnight.   Objective: Vitals:   11/27/19 1537 11/27/19 1919 11/28/19 0507 11/28/19 0733  BP: (!) 161/61 (!) 156/58 (!) 159/59 (!) 153/57  Pulse: (!) 58 (!) 59 (!) 57 (!) 58  Resp: 19 20 20 18   Temp: (!) 97.5 F (36.4 C) 97.7 F (36.5 C) 98 F (36.7 C) 97.8 F (36.6 C)  TempSrc: Oral Oral Oral Oral  SpO2: 98% 100% 100% 97%  Weight:   88 kg   Height:        Intake/Output Summary (Last 24 hours) at 11/28/2019 1223 Last data filed at 11/28/2019 8182 Gross per 24 hour  Intake 160.45 ml  Output 50 ml  Net 110.45 ml   Physical Exam: A&Ox3, NAD CV: RRR Pulmonary: CTA Bilaterally Abdomen: Soft, Nontender, Nondistended Right Groin:  Central Line: Intact, clean and dry. No signs of infection of swelling.  Vascular:  Left Upper Extremity:    OR VAC removed.    Wound healing well. Granulation tissue noted.    VAC replaced.   Media Information   Photos    11/28/2019 12:14  Attached To:  Hospital Encounter on 11/24/19   [name], Janalyn Harder, PA-C  [facility]-Telemetry (2a)   Laboratory: CBC    Component Value Date/Time   WBC 5.7 11/26/2019 0426   HGB 8.6 (L) 11/26/2019 0426   HGB 10.5 (L) 09/15/2014 0948   HCT 26.4 (L) 11/26/2019 0426   HCT 34.2 (L) 09/15/2014 0948   PLT 203 11/26/2019 0426   PLT 203 09/15/2014 0948   BMET    Component Value Date/Time   NA 132 (L) 11/26/2019 0426   NA 135 (L) 09/15/2014 0948   K 3.8 11/26/2019 0426   K 4.8 09/15/2014 0948   CL 96 (L) 11/26/2019 0426   CL 99 09/15/2014 0948   CO2 28 11/26/2019 0426   CO2 26 09/15/2014 0948   GLUCOSE 235 (H) 11/26/2019 0426   GLUCOSE 118 (H) 09/15/2014 0948   BUN 7 11/26/2019 0426   BUN 19 (H) 09/15/2014 0948   CREATININE 2.89 (H) 11/26/2019  0426   CREATININE 6.79 (H) 09/15/2014 0948   CALCIUM 8.0 (L) 11/26/2019 0426   CALCIUM 8.3 (L) 09/15/2014 0948   GFRNONAA 17 (L) 11/26/2019 0426   GFRNONAA 7 (L) 09/15/2014 0948   GFRNONAA 6 (L) 05/31/2014 0432   GFRAA 20 (L) 11/26/2019 0426   GFRAA 8 (L) 09/15/2014 0948   GFRAA 7 (L) 05/31/2014 0432   Assessment/Planning: The patient is a 59 year old female with a known history of end-stage renal disease status post excision of an infected left upper extremity graft approximately 2 weeks ago.  Patient was taken back to the operating room on Friday for I&D of abscess, washout, debridement with VAC placement - POD#3  1) LUE Wound: OR VAC dressing removed with wound nurse. Wound is healing well.  Granulation tissue noted.  Some bloody discharge from proximal old hematoma site. Surrounding skin is less erythema and induration. Forearm with less swelling. New VAC placed.  2) VAC therapy: Patient to continue VAC changes M-W-F This can be changed to accommodate discharge planning Settings: 150mmhg, continuous  3) ESRD: Dialysis as per nephrology  4) Discharge Planning: From a vascular surgery standpoint the patient can be discharged in medically stable We will reach out to  the team to start [place] with Dr. Eber Hong [facility] PA-C 11/28/2019 12:23 PM

## 2019-11-29 LAB — CULTURE, BLOOD (ROUTINE X 2)
Culture: NO GROWTH
Culture: NO GROWTH
Special Requests: ADEQUATE

## 2019-11-29 LAB — GLUCOSE, CAPILLARY
Glucose-Capillary: 81 mg/dL (ref 70–99)
Glucose-Capillary: 154 mg/dL — ABNORMAL HIGH (ref 70–99)
Glucose-Capillary: 147 mg/dL — ABNORMAL HIGH (ref 70–99)
Glucose-Capillary: 77 mg/dL (ref 70–99)

## 2019-11-29 LAB — HEPATITIS B SURFACE ANTIBODY, QUANTITATIVE: Hep B S AB Quant (Post): >1000 m[IU]/mL (ref >9.9)

## 2019-11-29 LAB — SURGICAL PATHOLOGY

## 2019-11-29 NOTE — Discharge Summary (Addendum)
Physician Discharge Summary  [name] RUE:[id number] DOB: [birth year] DOA: 11/24/2019  PCP: Freddy Finner, NP  Admit date: 11/24/2019 Discharge date: 11/30/2019  Discharge disposition: SNF   Recommendations for Outpatient Follow-Up:   Follow up with physician at the nursing home within 3 days of discharge Follow up with nephrologist for hemodialysis as scheduled Follow up with Dr. Delana Meyer, vascular surgeon, as scheduled   Discharge Diagnosis:   Principal Problem:   Hematoma of arm, left, subsequent encounter Active Problems:   End-stage renal disease on hemodialysis [facility])    Discharge Condition: Stable.  Diet recommendation: Renal diet  Code status: Full code    Hospital Course:   [name] is an 59 y.o. female withmedical history significant forend stage renal disease on hemodialysis for last fifteen years,MWF,pancreatic insufficiency,chronic essentialhypertension, peripheral vascular disease, GERD, diabetes mellitus type II, anemia of chronic kidney disease andCOPD. Patient was recently on admission and discharged on 02/25 on account of hemorraghic shock as a result infected left AV fistula graft. Patient underwent emergent graft removal and currently has a perm cath on right anterior chest for dialysis. She is currently at the nursing home and receives wound dressing daily and has a wound vac.She returned to the ED on account of complaints of worsening left upper extremity swelling and pain prior 24 hours prior to admission.   She was found to have left axillary hematoma.   She was seen by vascular surgeon, Dr. Evelina Bucy, and she underwent incision and drainage of left axillary hematoma.  She was treated with empiric IV antibiotics.  Wound culture did not show any growth.  Wound VAC therapy was applied to her wound.  She will continue with wound VAC therapy and local wound care at the nursing home.  A right triple-lumen catheter was placed in the right femoral vein  because of poor peripheral IV access.  She was seen by nephrologist for hemodialysis. Her condition has improved and she's deemed stable for discharge today. She said she no longer takes Prozac so it has been discontinued from her home medicines   Discharge Exam:   Vitals:   11/29/19 0326 11/29/19 0721  BP: (!) 146/57 (!) 140/44  Pulse: 63 64  Resp: 20 16  Temp: 98.2 F (36.8 C) 98.4 F (36.9 C)  SpO2: 96% 98%   Vitals:   11/28/19 1917 11/29/19 0326 11/29/19 0326 11/29/19 0721  BP: (!) 174/81 (!) 146/57 (!) 146/57 (!) 140/44  Pulse: 65 63 63 64  Resp: 20 20 20 16   Temp: 98.1 F (36.7 C) 98.2 F (36.8 C) 98.2 F (36.8 C) 98.4 F (36.9 C)  TempSrc: Oral Oral Oral Oral  SpO2: 97% 95% 96% 98%  Weight:   92.8 kg   Height:         GEN: NAD SKIN: No rash EYES: EOMI ENT: MMM CV: RRR PULM: No wheezing but she has mild bibasilar rales ABD: soft, obese, NT, +BS CNS: AAO x 3, non focal EXT: No edema or tenderness. Left arm wound with wound vac   The results of significant diagnostics from this hospitalization (including imaging, microbiology, ancillary and laboratory) are listed below for reference.     Procedures and Diagnostic Studies:   CT HUMERUS LEFT WO CONTRAST  Result Date: 11/24/2019 CLINICAL DATA:  End-stage renal disease on hemodialysis with soft tissue swelling involving the left upper extremity. Patient had an infected left arm brachial artery AV graft EXAM: CT OF THE UPPER LEFT EXTREMITY WITHOUT CONTRAST TECHNIQUE: Multidetector CT  imaging of the upper left extremity was performed according to the standard protocol. COMPARISON:  None. FINDINGS: There is a moderate size subcutaneous hematoma noted in the anterior aspect of the left upper arm. This measures 4.5 x 3.0 cm. There is a small hematoma just below this. Open wound is noted along the anterior aspect of the upper arm. There is diffuse subcutaneous soft tissue swelling/edema/fluid involving the entire left upper  extremity. I do not see a discrete drainable soft tissue abscess. Suspect diffuse myofasciitis without definite findings for pyomyositis. No findings suspicious for septic arthritis or osteomyelitis. Suspect remote distal radius fracture with volar impaction. IMPRESSION: 1. Diffuse subcutaneous soft tissue swelling/edema/fluid involving the entire left upper extremity without discrete drainable soft tissue abscess. 2. Diffuse myofasciitis without definite findings for pyomyositis. 3. Moderate-sized subcutaneous hematoma involving the anterior aspect of the upper arm. Electronically Signed   By: Marijo Sanes M.D.   On: 11/24/2019 16:57   CT FOREARM LEFT WO CONTRAST  Result Date: 11/24/2019 CLINICAL DATA:  End-stage renal disease on hemodialysis with soft tissue swelling involving the left upper extremity. Patient had an infected left arm brachial artery AV graft EXAM: CT OF THE UPPER LEFT EXTREMITY WITHOUT CONTRAST TECHNIQUE: Multidetector CT imaging of the upper left extremity was performed according to the standard protocol. COMPARISON:  None. FINDINGS: There is a moderate size subcutaneous hematoma noted in the anterior aspect of the left upper arm. This measures 4.5 x 3.0 cm. There is a small hematoma just below this. Open wound is noted along the anterior aspect of the upper arm. There is diffuse subcutaneous soft tissue swelling/edema/fluid involving the entire left upper extremity. I do not see a discrete drainable soft tissue abscess. Suspect diffuse myofasciitis without definite findings for pyomyositis. No findings suspicious for septic arthritis or osteomyelitis. Suspect remote distal radius fracture with volar impaction. IMPRESSION: 1. Diffuse subcutaneous soft tissue swelling/edema/fluid involving the entire left upper extremity without discrete drainable soft tissue abscess. 2. Diffuse myofasciitis without definite findings for pyomyositis. 3. Moderate-sized subcutaneous hematoma involving the  anterior aspect of the upper arm. Electronically Signed   By: Marijo Sanes M.D.   On: 11/24/2019 16:57   CT HAND LEFT WO CONTRAST  Result Date: 11/24/2019 CLINICAL DATA:  End-stage renal disease on hemodialysis with soft tissue swelling involving the left upper extremity. Patient had an infected left arm brachial artery AV graft EXAM: CT OF THE UPPER LEFT EXTREMITY WITHOUT CONTRAST TECHNIQUE: Multidetector CT imaging of the upper left extremity was performed according to the standard protocol. COMPARISON:  None. FINDINGS: There is a moderate size subcutaneous hematoma noted in the anterior aspect of the left upper arm. This measures 4.5 x 3.0 cm. There is a small hematoma just below this. Open wound is noted along the anterior aspect of the upper arm. There is diffuse subcutaneous soft tissue swelling/edema/fluid involving the entire left upper extremity. I do not see a discrete drainable soft tissue abscess. Suspect diffuse myofasciitis without definite findings for pyomyositis. No findings suspicious for septic arthritis or osteomyelitis. Suspect remote distal radius fracture with volar impaction. IMPRESSION: 1. Diffuse subcutaneous soft tissue swelling/edema/fluid involving the entire left upper extremity without discrete drainable soft tissue abscess. 2. Diffuse myofasciitis without definite findings for pyomyositis. 3. Moderate-sized subcutaneous hematoma involving the anterior aspect of the upper arm. Electronically Signed   By: Marijo Sanes M.D.   On: 11/24/2019 16:57   US Venous Img Lower Bilateral (DVT)  Result Date: 11/25/2019 CLINICAL DATA:  Leg pain, swelling  EXAM: BILATERAL LOWER EXTREMITY VENOUS DOPPLER ULTRASOUND TECHNIQUE: Gray-scale sonography with compression, as well as color and duplex ultrasound, were performed to evaluate the deep venous system(s) from the level of the common femoral vein through the popliteal and proximal calf veins. COMPARISON:  None. FINDINGS: VENOUS Normal  compressibility of the common femoral, superficial femoral, and popliteal veins, as well as the visualized calf veins. Visualized portions of profunda femoral vein and great saphenous vein unremarkable. No filling defects to suggest DVT on grayscale or color Doppler imaging. Doppler waveforms show normal direction of venous flow, normal respiratory phasicity and response to augmentation. OTHER None. Limitations: none IMPRESSION: No evidence of lower extremity DVT. Electronically Signed   By: Rolm Baptise M.D.   On: 11/25/2019 16:57     Labs:   Basic Metabolic Panel: Recent Labs  Lab 11/24/19 1437 11/24/19 1437 11/25/19 0830 11/26/19 0426 11/28/19 1011  NA 131*  --  132* 132*  --   K 3.3*   < > 3.6 3.8  --   CL 95*  --  97* 96*  --   CO2 29  --  23 28  --   GLUCOSE 106*  --  71 235*  --   BUN 5*  --  10 7  --   CREATININE 3.49*  --  4.06* 2.89*  --   CALCIUM 8.1*  --  7.9* 8.0*  --   MG  --   --  1.8 1.8  --   PHOS  --   --   --   --  2.8   < > = values in this interval not displayed.   GFR Estimated Creatinine Clearance: 23.3 mL/min (A) (by C-G formula based on SCr of 2.89 mg/dL (H)). Liver Function Tests: Recent Labs  Lab 11/24/19 1437  AST 22  ALT <5  ALKPHOS 91  BILITOT 0.5  PROT 6.2*  ALBUMIN 2.8*   No results for input(s): LIPASE, AMYLASE in the last 168 hours. No results for input(s): AMMONIA in the last 168 hours. Coagulation profile Recent Labs  Lab 11/24/19 1437 11/25/19 0830  INR 1.2 1.3*    CBC: Recent Labs  Lab 11/24/19 1437 11/25/19 0830 11/26/19 0426  WBC 7.9 8.5 5.7  NEUTROABS 3.6  --   --   HGB 10.2* 10.0* 8.6*  HCT 32.1* 31.7* 26.4*  MCV 87.5 88.3 85.4  PLT 209 212 203   Cardiac Enzymes: No results for input(s): CKTOTAL, CKMB, CKMBINDEX, TROPONINI in the last 168 hours. BNP: Invalid input(s): POCBNP CBG: Recent Labs  Lab 11/28/19 0731 11/28/19 1225 11/28/19 1701 11/28/19 2201 11/29/19 0722  GLUCAP 105* 70 88 105* 81    D-Dimer No results for input(s): DDIMER in the last 72 hours. Hgb A1c No results for input(s): HGBA1C in the last 72 hours. Lipid Profile No results for input(s): CHOL, HDL, LDLCALC, TRIG, CHOLHDL, LDLDIRECT in the last 72 hours. Thyroid function studies No results for input(s): TSH, T4TOTAL, T3FREE, THYROIDAB in the last 72 hours.  Invalid input(s): FREET3 Anemia work up No results for input(s): VITAMINB12, FOLATE, FERRITIN, TIBC, IRON, RETICCTPCT in the last 72 hours. Microbiology Recent Results (from the past 240 hour(s))  Respiratory Panel by RT PCR (Flu A&B, Covid) - Nasopharyngeal Swab     Status: None   Collection Time: 11/24/19  6:19 PM   Specimen: Nasopharyngeal Swab  Result Value Ref Range Status   SARS Coronavirus 2 by RT PCR NEGATIVE NEGATIVE Final    Comment: (NOTE) SARS-CoV-2 target nucleic  acids are NOT DETECTED. The SARS-CoV-2 RNA is generally detectable in upper respiratoy specimens during the acute phase of infection. The lowest concentration of SARS-CoV-2 viral copies this assay can detect is 131 copies/mL. A negative result does not preclude SARS-Cov-2 infection and should not be used as the sole basis for treatment or other patient management decisions. A negative result may occur with  improper specimen collection/handling, submission of specimen other than nasopharyngeal swab, presence of viral mutation(s) within the areas targeted by this assay, and inadequate number of viral copies (<131 copies/mL). A negative result must be combined with clinical observations, patient history, and epidemiological information. The expected result is Negative. Fact Sheet for Patients:  PinkCheek.be Fact Sheet for Healthcare Providers:  GravelBags.it This test is not yet ap proved or cleared by the [place] FDA and  has been authorized for detection and/or diagnosis of SARS-CoV-2 by FDA under an Emergency  Use Authorization (EUA). This EUA will remain  in effect (meaning this test can be used) for the duration of the COVID-19 declaration under Section 564(b)(1) of the Act, 21 U.S.C. section 360bbb-3(b)(1), unless the authorization is terminated or revoked sooner.    Influenza A by PCR NEGATIVE NEGATIVE Final   Influenza B by PCR NEGATIVE NEGATIVE Final    Comment: (NOTE) The Xpert Xpress SARS-CoV-2/FLU/RSV assay is intended as an aid in  the diagnosis of influenza from Nasopharyngeal swab specimens and  should not be used as a sole basis for treatment. Nasal washings and  aspirates are unacceptable for Xpert Xpress SARS-CoV-2/FLU/RSV  testing. Fact Sheet for Patients: PinkCheek.be Fact Sheet for Healthcare Providers: GravelBags.it This test is not yet approved or cleared by the [place] FDA and  has been authorized for detection and/or diagnosis of SARS-CoV-2 by  FDA under an Emergency Use Authorization (EUA). This EUA will remain  in effect (meaning this test can be used) for the duration of the  Covid-19 declaration under Section 564(b)(1) of the Act, 21  U.S.C. section 360bbb-3(b)(1), unless the authorization is  terminated or revoked. Performed at [facility], [address] [place], [place] [address]   Blood culture (routine x 2)     Status: None   Collection Time: 11/24/19  7:46 PM   Specimen: BLOOD  Result Value Ref Range Status   Specimen Description BLOOD LEFT JUGULAR  Final   Special Requests   Final    BOTTLES DRAWN AEROBIC AND ANAEROBIC Blood Culture results may not be optimal due to an inadequate volume of blood received in culture bottles   Culture   Final    NO GROWTH 5 DAYS Performed at [facility], [address], [place], [place] [address]    Report Status 11/29/2019 FINAL  Final  Blood culture (routine x 2)     Status: None   Collection Time: 11/24/19  7:46 PM   Specimen:  BLOOD  Result Value Ref Range Status   Specimen Description BLOOD LEFT JUGULAR  Final   Special Requests   Final    BOTTLES DRAWN AEROBIC AND ANAEROBIC Blood Culture adequate volume   Culture   Final    NO GROWTH 5 DAYS Performed at [facility], [address] [place],  [address]    Report Status 11/29/2019 FINAL  Final  MRSA PCR Screening     Status: None   Collection Time: 11/24/19  9:02 PM   Specimen: Nasal Mucosa; Nasopharyngeal  Result Value Ref Range Status   MRSA by PCR NEGATIVE NEGATIVE  Final    Comment:        The GeneXpert MRSA Assay (FDA approved for NASAL specimens only), is one component of a comprehensive MRSA colonization surveillance program. It is not intended to diagnose MRSA infection nor to guide or monitor treatment for MRSA infections. Performed at [facility], [address] [place], [place] [address]   Aerobic/Anaerobic Culture (surgical/deep wound)     Status: None (Preliminary result)   Collection Time: 11/25/19 11:08 AM   Specimen: PATH Other; Tissue  Result Value Ref Range Status   Specimen Description   Final    WOUND LEFT AXILLA Performed at [facility], [address] [place], [place] [address]    Special Requests   Final    NONE Performed at [facility], [place], [place] [address]    Gram Stain   Final    RARE WBC PRESENT,BOTH PMN AND MONONUCLEAR NO ORGANISMS SEEN    Culture   Final    NO GROWTH 3 DAYS NO ANAEROBES ISOLATED; CULTURE IN PROGRESS FOR 5 DAYS Performed at [facility], [address], [place], [place] [address]    Report Status PENDING  Incomplete  Respiratory Panel by RT PCR (Flu A&B, Covid) - Nasopharyngeal Swab     Status: None   Collection Time: 11/28/19  4:07 PM   Specimen: Nasopharyngeal Swab  Result Value Ref Range Status   SARS Coronavirus 2 by RT PCR NEGATIVE NEGATIVE Final    Comment: (NOTE) SARS-CoV-2 target nucleic acids are NOT  DETECTED. The SARS-CoV-2 RNA is generally detectable in upper respiratoy specimens during the acute phase of infection. The lowest concentration of SARS-CoV-2 viral copies this assay can detect is 131 copies/mL. A negative result does not preclude SARS-Cov-2 infection and should not be used as the sole basis for treatment or other patient management decisions. A negative result may occur with  improper specimen collection/handling, submission of specimen other than nasopharyngeal swab, presence of viral mutation(s) within the areas targeted by this assay, and inadequate number of viral copies (<131 copies/mL). A negative result must be combined with clinical observations, patient history, and epidemiological information. The expected result is Negative. Fact Sheet for Patients:  PinkCheek.be Fact Sheet for Healthcare Providers:  GravelBags.it This test is not yet ap proved or cleared by the [place] FDA and  has been authorized for detection and/or diagnosis of SARS-CoV-2 by FDA under an Emergency Use Authorization (EUA). This EUA will remain  in effect (meaning this test can be used) for the duration of the COVID-19 declaration under Section 564(b)(1) of the Act, 21 U.S.C. section 360bbb-3(b)(1), unless the authorization is terminated or revoked sooner.    Influenza A by PCR NEGATIVE NEGATIVE Final   Influenza B by PCR NEGATIVE NEGATIVE Final    Comment: (NOTE) The Xpert Xpress SARS-CoV-2/FLU/RSV assay is intended as an aid in  the diagnosis of influenza from Nasopharyngeal swab specimens and  should not be used as a sole basis for treatment. Nasal washings and  aspirates are unacceptable for Xpert Xpress SARS-CoV-2/FLU/RSV  testing. Fact Sheet for Patients: PinkCheek.be Fact Sheet for Healthcare Providers: GravelBags.it This test is not yet approved or cleared  by the [place] FDA and  has been authorized for detection and/or diagnosis of SARS-CoV-2 by  FDA under an Emergency Use Authorization (EUA). This EUA will remain  in effect (meaning this test can be used) for the duration of the  Covid-19 declaration under Section 564(b)(1)  of the Act, 21  U.S.C. section 360bbb-3(b)(1), unless the authorization is  terminated or revoked. Performed at [facility], [address] [place], [place] [address]      Discharge Instructions:   Discharge Instructions    Diet renal [id number]   Complete by: As directed    Discharge instructions   Complete by: As directed    Follow up with nephrologist for hemodialysis as scheduled. Follow up with physician at the nursing home within 3 days of discharge.   Increase activity slowly   Complete by: As directed      Allergies as of 11/29/2019      Reactions   Ace Inhibitors Swelling, Anaphylaxis   Ativan [lorazepam] Other (See Comments)   Reaction:Hallucinations and headaches   Compazine [prochlorperazine Edisylate] Anaphylaxis, Nausea And Vomiting, Other (See Comments)   Other reaction(s): dystonia from this vs. Reglan, 23 Jul - patient relates that she takes promethazine frequently with no problems   Sumatriptan Succinate Other (See Comments)   Other reaction(s): delirium and hallucinations per [facility] records   Dilaudid [hydromorphone Hcl] Other (See Comments)   Delirium   Ondansetron Other (See Comments)   hallucinations    Zofran [ondansetron Hcl] Other (See Comments)   Reaction:  hallucinations    Dilaudid [hydromorphone]    Zofran [ondansetron]    Per pt. she is allergic to zofran or will experience adverse reaction like hallucination    Codeine Nausea And Vomiting   Gabapentin Other (See Comments)   Reaction:  Unknown    Lac Bovis Nausea And Vomiting   Losartan Nausea Only   Oxycodone Anxiety   Prochlorperazine Other (See Comments)   Reaction:  Unknown . Patient does not  remember reaction but she does have vertigo and anxiety along with n and v at times. Could be used to treat any of these   Reglan [metoclopramide] Other (See Comments)   Per patient her Dr. Evelina Bucy her off it    Scopolamine Other (See Comments)   Dizziness, also has vertigo already   Tape Rash   Tapentadol Rash      Medication List    STOP taking these medications   ATIVAN PO   FLUoxetine 20 MG capsule Commonly known as: PROZAC     TAKE these medications   albuterol (2.5 MG/3ML) 0.083% nebulizer solution Commonly known as: PROVENTIL Take 2.5 mg by nebulization every 4 (four) hours as needed for wheezing or shortness of breath.   albuterol 108 (90 Base) MCG/ACT inhaler Commonly known as: VENTOLIN HFA Inhale 2 puffs into the lungs every 4 (four) hours as needed for wheezing or shortness of breath.   amLODipine 10 MG tablet Commonly known as: NORVASC Take 10 mg by mouth daily.   aspirin EC 81 MG tablet Take 81 mg by mouth daily.   atorvastatin 20 MG tablet Commonly known as: LIPITOR Take 20 mg by mouth daily.   carvedilol 6.25 MG tablet Commonly known as: COREG Take 6.25 mg by mouth 2 (two) times daily.   cholecalciferol 10 MCG (400 UNIT) Tabs tablet Commonly known as: VITAMIN D3 Take 400 Units by mouth daily.   citalopram 20 MG tablet Commonly known as: CELEXA Take 20 mg by mouth daily.   clopidogrel 75 MG tablet Commonly known as: PLAVIX Take 75 mg by mouth daily.   Creon 12000 units Cpep capsule Generic drug: lipase/protease/amylase Take 3 Units by mouth 3 (three) times daily. What changed: Another medication with the same name was removed. Continue taking this medication,  and follow the directions you see here.   cyanocobalamin 1000 MCG tablet Take 1,000 mcg by mouth daily.   diclofenac sodium 1 % Gel Commonly known as: VOLTAREN Apply 1 application topically daily as needed (for rash).   fluticasone 50 MCG/ACT nasal spray Commonly known as:  FLONASE Place 2 sprays into both nostrils daily.   gabapentin 100 MG capsule Commonly known as: NEURONTIN Take 100-200 mg by mouth 3 (three) times daily. Take one capsule (100 mg) twice daily at 0800 and 1430 and two capsules (200 mg) at bedtime   hydrALAZINE 10 MG tablet Commonly known as: APRESOLINE Take 10 mg by mouth 3 (three) times daily.   HYDROcodone-acetaminophen 5-325 MG tablet Commonly known as: NORCO/VICODIN Take 1 tablet by mouth 4 (four) times daily as needed for pain.   hydrocortisone cream 0.5 % Apply topically 3 (three) times daily.   meclizine 25 MG tablet Commonly known as: ANTIVERT Take 12.5 mg by mouth 2 (two) times daily.   multivitamin Tabs tablet Take 1 tablet by mouth daily.   mupirocin ointment 2 % Commonly known as: BACTROBAN Apply 1 application topically 3 (three) times daily.   nitroGLYCERIN 0.4 MG SL tablet Commonly known as: NITROSTAT Place 0.4 mg under the tongue every 5 (five) minutes as needed for chest pain.   pantoprazole 40 MG tablet Commonly known as: PROTONIX Take 40 mg by mouth daily.   promethazine 12.5 MG tablet Commonly known as: PHENERGAN Take 1 tablet (12.5 mg total) by mouth every 8 (eight) hours as needed for nausea.   sevelamer carbonate 800 MG tablet Commonly known as: RENVELA Take 800-2,400 mg by mouth See admin instructions. Take 2400 mg by mouth 3 times daily with meals and take 800 mg by mouth with snacks What changed: Another medication with the same name was removed. Continue taking this medication, and follow the directions you see here.       Contact information for follow-up providers    Schedule an appointment as soon as possible for a visit  with Freddy Finner, NP.   Specialty: Nurse Practitioner Contact information: [place] [address] [phone]        Kris Hartmann, NP Follow up on 12/06/2019.   Specialty: Vascular Surgery Why: Please keep your already scheduled  follow up on 12/06/19.  Contact information: [place] [address] [phone]            Contact information for after-discharge care    Destination    [facility] Preferred SNF .   Service: Skilled Nursing Contact information: [address] [place] [phone]                  Time coordinating discharge: 32 minutes  Signed:  Lorella Nimrod [facility] 11/30/2019, 11:22 AM

## 2019-11-29 NOTE — TOC Progression Note (Signed)
Transition of Care [facility]) - Progression Note    Patient Details  Name: [name] MRN: [id number] Date of Birth: [birth year]  [facility] CM/SW Contact  Eileen Stanford, LCSW Phone Number: 11/29/2019, 3:53 PM  Clinical Narrative:   CSW faxed clinicals yesterday afternoon to start insurance authorization. CSW called this AM to follow up and they state they do not see where a authorization was started-- a authorization was then started via phone--representative states the clincials were received however, someone had added them to the hospital admission file, representative fixed this. CSW called back at 3:00 to determine if authorization had been granted--still under review.    Expected Discharge Plan: Skilled Nursing Facility Barriers to Discharge: Insurance Authorization  Expected Discharge Plan and Services Expected Discharge Plan: [place] In-house Referral: NA   Post Acute Care Choice: [place] Living arrangements for the past 2 months: Single Family Home Expected Discharge Date: 11/29/19                                     Social Determinants of Health (SDOH) Interventions    Readmission Risk Interventions No flowsheet data found.

## 2019-11-29 NOTE — Progress Notes (Signed)
Patient's right femoral site clean and dry. No bleeding noted from removal of central line.

## 2019-11-29 NOTE — Progress Notes (Signed)
[facility], [place] 11/29/19  Subjective:  Patient resting comfortably in bed. Due for dialysis again tomorrow. Left arm swelling is improved significantly.    Objective:  Vital signs in last 24 hours:  Temp:  [97.8 F (36.6 C)-98.4 F (36.9 C)] 97.8 F (36.6 C) (03/09 1119) Pulse Rate:  [61-65] 61 (03/09 1119) Resp:  [16-20] 18 (03/09 1119) BP: (140-174)/(44-81) 156/56 (03/09 1119) SpO2:  [95 %-98 %] 98 % (03/09 1119) Weight:  [92.8 kg] 92.8 kg (03/09 0326)  Weight change: 4.808 kg Filed Weights   11/27/19 0515 11/28/19 0507 11/29/19 0326  Weight: 85.5 kg 88 kg 92.8 kg    Intake/Output:    Intake/Output Summary (Last 24 hours) at 11/29/2019 1534 Last data filed at 11/29/2019 1004 Gross per 24 hour  Intake 1190 ml  Output --  Net 1190 ml     Physical Exam: General:  No acute distress  HEENT  normocephalic atraumatic hearing intact  Pulm/lungs  clear to auscultation bilateral, normal effort  CVS/Heart  regular, no rubs  Abdomen:   Soft, nontender, nondistended, bowel sounds present  Extremities:  Trace lower extremity edema, mild swelling in left upper     extremity.  Neurologic:  Awake, alert, follows commands  Skin:  Warm, dry  Access:  IJ PermCath.  Wound VAC in old access in left upper extremity.       Basic Metabolic Panel:  Recent Labs  Lab 11/24/19 1437 11/25/19 0830 11/26/19 0426 11/28/19 1011  NA 131* 132* 132*  --   K 3.3* 3.6 3.8  --   CL 95* 97* 96*  --   CO2 29 23 28   --   GLUCOSE 106* 71 235*  --   BUN 5* 10 7  --   CREATININE 3.49* 4.06* 2.89*  --   CALCIUM 8.1* 7.9* 8.0*  --   MG  --  1.8 1.8  --   PHOS  --   --   --  2.8     CBC: Recent Labs  Lab 11/24/19 1437 11/25/19 0830 11/26/19 0426  WBC 7.9 8.5 5.7  NEUTROABS 3.6  --   --   HGB 10.2* 10.0* 8.6*  HCT 32.1* 31.7* 26.4*  MCV 87.5 88.3 85.4  PLT 209 212 203      Lab Results  Component Value Date   HEPBSAG Negative (A) 11/13/2019    HEPBIGM  06/12/2010    NEGATIVE (NOTE) High levels of Hepatitis B Core IgM antibody are detectable during the acute stage of Hepatitis B. This antibody is used to differentiate current from past HBV infection.       Microbiology:  Recent Results (from the past 240 hour(s))  Respiratory Panel by RT PCR (Flu A&B, Covid) - Nasopharyngeal Swab     Status: None   Collection Time: 11/24/19  6:19 PM   Specimen: Nasopharyngeal Swab  Result Value Ref Range Status   SARS Coronavirus 2 by RT PCR NEGATIVE NEGATIVE Final    Comment: (NOTE) SARS-CoV-2 target nucleic acids are NOT DETECTED. The SARS-CoV-2 RNA is generally detectable in upper respiratoy specimens during the acute phase of infection. The lowest concentration of SARS-CoV-2 viral copies this assay can detect is 131 copies/mL. A negative result does not preclude SARS-Cov-2 infection and should not be used as the sole basis for treatment or other patient management decisions. A negative result may occur with  improper specimen collection/handling, submission of specimen other than nasopharyngeal swab, presence of viral mutation(s) within the areas  targeted by this assay, and inadequate number of viral copies (<131 copies/mL). A negative result must be combined with clinical observations, patient history, and epidemiological information. The expected result is Negative. Fact Sheet for Patients:  PinkCheek.be Fact Sheet for Healthcare Providers:  GravelBags.it This test is not yet ap proved or cleared by the [place] FDA and  has been authorized for detection and/or diagnosis of SARS-CoV-2 by FDA under an Emergency Use Authorization (EUA). This EUA will remain  in effect (meaning this test can be used) for the duration of the COVID-19 declaration under Section 564(b)(1) of the Act, 21 U.S.C. section 360bbb-3(b)(1), unless the authorization is terminated or revoked  sooner.    Influenza A by PCR NEGATIVE NEGATIVE Final   Influenza B by PCR NEGATIVE NEGATIVE Final    Comment: (NOTE) The Xpert Xpress SARS-CoV-2/FLU/RSV assay is intended as an aid in  the diagnosis of influenza from Nasopharyngeal swab specimens and  should not be used as a sole basis for treatment. Nasal washings and  aspirates are unacceptable for Xpert Xpress SARS-CoV-2/FLU/RSV  testing. Fact Sheet for Patients: PinkCheek.be Fact Sheet for Healthcare Providers: GravelBags.it This test is not yet approved or cleared by the [place] FDA and  has been authorized for detection and/or diagnosis of SARS-CoV-2 by  FDA under an Emergency Use Authorization (EUA). This EUA will remain  in effect (meaning this test can be used) for the duration of the  Covid-19 declaration under Section 564(b)(1) of the Act, 21  U.S.C. section 360bbb-3(b)(1), unless the authorization is  terminated or revoked. Performed at [facility], [address] [place], [place] [address]   Blood culture (routine x 2)     Status: None   Collection Time: 11/24/19  7:46 PM   Specimen: BLOOD  Result Value Ref Range Status   Specimen Description BLOOD LEFT JUGULAR  Final   Special Requests   Final    BOTTLES DRAWN AEROBIC AND ANAEROBIC Blood Culture results may not be optimal due to an inadequate volume of blood received in culture bottles   Culture   Final    NO GROWTH 5 DAYS Performed at [facility], [address] [place], [place] [address]    Report Status 11/29/2019 FINAL  Final  Blood culture (routine x 2)     Status: None   Collection Time: 11/24/19  7:46 PM   Specimen: BLOOD  Result Value Ref Range Status   Specimen Description BLOOD LEFT JUGULAR  Final   Special Requests   Final    BOTTLES DRAWN AEROBIC AND ANAEROBIC Blood Culture adequate volume   Culture   Final    NO GROWTH 5 DAYS Performed at [facility], [address] [place], [place] [address]    Report Status 11/29/2019 FINAL  Final  MRSA PCR Screening     Status: None   Collection Time: 11/24/19  9:02 PM   Specimen: Nasal Mucosa; Nasopharyngeal  Result Value Ref Range Status   MRSA by PCR NEGATIVE NEGATIVE Final    Comment:        The GeneXpert MRSA Assay (FDA approved for NASAL specimens only), is one component of a comprehensive MRSA colonization surveillance program. It is not intended to diagnose MRSA infection nor to guide or monitor treatment for MRSA infections. Performed at [facility], [address] [place], [place] [address]   Aerobic/Anaerobic Culture (surgical/deep wound)     Status: None (Preliminary result)   Collection Time: 11/25/19 11:08 AM  Specimen: PATH Other; Tissue  Result Value Ref Range Status   Specimen Description   Final    WOUND LEFT AXILLA Performed at [facility], [place] [address] [place], [place] [address]    Special Requests   Final    NONE Performed at [facility], [place], [place] [address]    Gram Stain   Final    RARE WBC PRESENT,BOTH PMN AND MONONUCLEAR NO ORGANISMS SEEN    Culture   Final    NO GROWTH 4 DAYS Performed at [facility], [place] [address] [place], [place] [address]    Report Status PENDING  Incomplete  Respiratory Panel by RT PCR (Flu A&B, Covid) - Nasopharyngeal Swab     Status: None   Collection Time: 11/28/19  4:07 PM   Specimen: Nasopharyngeal Swab  Result Value Ref Range Status   SARS Coronavirus 2 by RT PCR NEGATIVE NEGATIVE Final    Comment: (NOTE) SARS-CoV-2 target nucleic acids are NOT DETECTED. The SARS-CoV-2 RNA is generally detectable in upper respiratoy specimens during the acute phase of infection. The lowest concentration of SARS-CoV-2 viral copies this assay can detect is 131 copies/mL. A negative result does not preclude SARS-Cov-2 infection and should not be used as the sole basis for  treatment or other patient management decisions. A negative result may occur with  improper specimen collection/handling, submission of specimen other than nasopharyngeal swab, presence of viral mutation(s) within the areas targeted by this assay, and inadequate number of viral copies (<131 copies/mL). A negative result must be combined with clinical observations, patient history, and epidemiological information. The expected result is Negative. Fact Sheet for Patients:  PinkCheek.be Fact Sheet for Healthcare Providers:  GravelBags.it This test is not yet ap proved or cleared by the [place] FDA and  has been authorized for detection and/or diagnosis of SARS-CoV-2 by FDA under an Emergency Use Authorization (EUA). This EUA will remain  in effect (meaning this test can be used) for the duration of the COVID-19 declaration under Section 564(b)(1) of the Act, 21 U.S.C. section 360bbb-3(b)(1), unless the authorization is terminated or revoked sooner.    Influenza A by PCR NEGATIVE NEGATIVE Final   Influenza B by PCR NEGATIVE NEGATIVE Final    Comment: (NOTE) The Xpert Xpress SARS-CoV-2/FLU/RSV assay is intended as an aid in  the diagnosis of influenza from Nasopharyngeal swab specimens and  should not be used as a sole basis for treatment. Nasal washings and  aspirates are unacceptable for Xpert Xpress SARS-CoV-2/FLU/RSV  testing. Fact Sheet for Patients: PinkCheek.be Fact Sheet for Healthcare Providers: GravelBags.it This test is not yet approved or cleared by the [place] FDA and  has been authorized for detection and/or diagnosis of SARS-CoV-2 by  FDA under an Emergency Use Authorization (EUA). This EUA will remain  in effect (meaning this test can be used) for the duration of the  Covid-19 declaration under Section 564(b)(1) of the Act, 21  U.S.C. section  360bbb-3(b)(1), unless the authorization is  terminated or revoked. Performed at [facility], [address] [place], [place] [address]     Coagulation Studies: No results for input(s): LABPROT, INR in the last 72 hours.  Urinalysis: No results for input(s): COLORURINE, LABSPEC, PHURINE, GLUCOSEU, HGBUR, BILIRUBINUR, KETONESUR, PROTEINUR, UROBILINOGEN, NITRITE, LEUKOCYTESUR in the last 72 hours.  Invalid input(s): APPERANCEUR    Imaging: No results found.   Medications:   . sodium chloride Stopped (11/26/19 2149)   . amLODipine  10 mg Oral Daily  . vitamin C  500 mg Oral BID  . aspirin EC  81 mg Oral Daily  . atorvastatin  20 mg Oral Daily  . carvedilol  6.25 mg Oral BID  . Chlorhexidine Gluconate Cloth  6 each Topical Q0600  . citalopram  20 mg Oral Daily  . clopidogrel  75 mg Oral Daily  . epoetin (EPOGEN/PROCRIT) injection  4,000 Units Intravenous Q M,W,F-HD  . feeding supplement (PRO-STAT SUGAR FREE 64)  30 mL Oral BID  . FLUoxetine  20 mg Oral QHS  . fluticasone  2 spray Each Nare Daily  . gabapentin  100 mg Oral BID  . gabapentin  200 mg Oral QHS  . hydrALAZINE  10 mg Oral TID  . insulin aspart  0-6 Units Subcutaneous TID WC  . lipase/protease/amylase  36,000 Units Oral TID WC  . meclizine  12.5 mg Oral BID  . multivitamin  1 tablet Oral Daily  . pantoprazole  40 mg Oral Daily  . sevelamer carbonate  2,400 mg Oral TID WC  . sodium chloride flush  10-40 mL Intracatheter Q12H  . sodium chloride flush  3 mL Intravenous Q12H  . cyanocobalamin  1,000 mcg Oral Daily   sodium chloride, acetaminophen **OR** acetaminophen, albuterol, diphenhydrAMINE, hydrALAZINE, LORazepam, nitroGLYCERIN, oxyCODONE, promethazine, sodium chloride flush  Assessment/ Plan:  59 y.o. female with end stage renal disease on hemodialysis for last fifteen years, pancreatic insufficiency, hypertension, peripheral vascular disease, GERD, diabetes mellitus type II, COPD  Principal  Problem:   Hematoma of arm, left, subsequent encounter Active Problems:   End-stage renal disease on hemodialysis ([place])  [facility] M[facility]   #. ESRD Patient completed dialysis yesterday.  No urgent indication for dialysis today.  We will plan for dialysis again tomorrow.  #. Anemia of CKD  Lab Results  Component Value Date   HGB 8.6 (L) 11/26/2019   Continue Epogen on dialysis days.  #. SHPTH     Component Value Date/Time   PTH 357 (H) 10/05/2018 1241   Lab Results  Component Value Date   PHOS 2.8 11/28/2019   Phosphorus under good control.  Repeat serum phosphorus tomorrow.  #. Diabetes type 2 with CKD Hemoglobin A1C (%)  Date Value  07/19/2013 9.3 (H)   Hgb A1c MFr Bld (%)  Date Value  11/15/2019 4.7 (L)   Glycemic control as per hospitalist.   LOS: 5 Eugune Sine 3/9/20213:34 PM  [place], [place]

## 2019-11-29 NOTE — Progress Notes (Addendum)
Progress Note    [name]  GEX:[id number] DOB: [birth year]  DOA: 11/24/2019 PCP: Freddy Finner, NP      Brief Narrative:    Medical records reviewed and are as summarized below:   [name] an 59 y.o.femalewithmedical history significant forend stage renal disease on hemodialysis for last fifteen years,MWF,pancreatic insufficiency,chronic essentialhypertension, peripheral vascular disease, GERD, diabetes mellitus type II, anemia of chronic kidney disease andCOPD. Patient was recently on admission and discharged on 02/25 on account of hemorraghic shock as a resultinfected left AV fistula graft. Patient underwent emergent graft removal and currently has a perm cath on right anterior chest for dialysis. She is currently at the nursing home and receives wound dressing daily and has a wound vac.She returnedto the ED on account of complaints of worsening left upper extremity swelling and pain prior 24 hours prior to admission.   She was found to have left axillary hematoma.She was seen by vascular surgeon, Dr. Evelina Bucy, and she underwent incision and drainage of left axillary hematoma. She was treated with empiric IV antibiotics. Wound culture did not show any growth. Wound VAC therapy was applied to her wound. She will continue with wound VAC therapy and local wound care at the nursing home.  A right triple-lumen catheter was placed in the right femoral veinbecause of poor peripheral IV access. She was seen by nephrologist for hemodialysis. Her condition has improved and she's deemed stable for discharge today. She said she no longer takes Prozac so it has been discontinued from her home medicines   Assessment/Plan:   Principal Problem:   Hematoma of arm, left, subsequent encounter Active Problems:   End-stage renal disease on hemodialysis (HCC)   Left arm swelling/left upper extremity wound/left axillary hematoma:  S/P incision and drainage of left axillary  hematoma by vascular surgeon on 11/25/2019. No growth from blood and wound cultures thus far.  ESRD on HD MWF: Right femoral triple-lumen catheter was placed by vascular surgeon 11/25/2019 because of poor peripheral IV access.  This will be removed prior to discharge.  Follow-up with nephrologist.   Chronic diastolic CHF: Compensated  Chronic Essential Hypertension.  Continue antihypertensives.  Anemia of chronic kidney disease . Hemoglobin remains stable.   CAD/PVD: Continue aspirin and Plavix.  Secondary hyperparathyroidism: Continue with home dose of sevelamer   Sinus bradycardia: Asymptomatic.  Heart rate is stable.      Body mass index is 34.05 kg/m.  (Obesity)   Family Communication/Anticipated D/C date and plan/Code Status   DVT prophylaxis: SCDs Code Status: Full code Family Communication: Plan discussed with patient Disposition Plan: Patient is from SNF and plan to discharge to SNF today or tomorrow.  She is stable for discharge but awaiting insurance authorization.    Subjective:   No complaints.  Left pain and swelling have improved.  Objective:    Vitals:   11/29/19 0326 11/29/19 0721 11/29/19 1119 11/29/19 1559  BP: (!) 146/57 (!) 140/44 (!) 156/56 (!) 149/59  Pulse: 63 64 61 61  Resp: 20 16 18 18   Temp: 98.2 F (36.8 C) 98.4 F (36.9 C) 97.8 F (36.6 C) 98.4 F (36.9 C)  TempSrc: Oral Oral Oral Oral  SpO2: 96% 98% 98% 100%  Weight: 92.8 kg     Height:        Intake/Output Summary (Last 24 hours) at 11/29/2019 1641 Last data filed at 11/29/2019 1004 Gross per 24 hour  Intake 1190 ml  Output --  Net 1190 ml  Filed Weights   11/27/19 0515 11/28/19 0507 11/29/19 0326  Weight: 85.5 kg 88 kg 92.8 kg    Exam:  GEN: NAD SKIN: No rash EYES: EOMI ENT: MMM CV: RRR PULM: No wheezing but she has mild bibasilar rales ABD: soft, obese, NT, +BS CNS: AAO x 3, non focal EXT: No edema or tenderness. Left arm wound with wound vac    Data  Reviewed:   I have personally reviewed following labs and imaging studies:  Labs: Labs show the following:   Basic Metabolic Panel: Recent Labs  Lab 11/24/19 1437 11/24/19 1437 11/25/19 0830 11/26/19 0426 11/28/19 1011  NA 131*  --  132* 132*  --   K 3.3*   < > 3.6 3.8  --   CL 95*  --  97* 96*  --   CO2 29  --  23 28  --   GLUCOSE 106*  --  71 235*  --   BUN 5*  --  10 7  --   CREATININE 3.49*  --  4.06* 2.89*  --   CALCIUM 8.1*  --  7.9* 8.0*  --   MG  --   --  1.8 1.8  --   PHOS  --   --   --   --  2.8   < > = values in this interval not displayed.   GFR Estimated Creatinine Clearance: 23.3 mL/min (A) (by C-G formula based on SCr of 2.89 mg/dL (H)). Liver Function Tests: Recent Labs  Lab 11/24/19 1437  AST 22  ALT <5  ALKPHOS 91  BILITOT 0.5  PROT 6.2*  ALBUMIN 2.8*   No results for input(s): LIPASE, AMYLASE in the last 168 hours. No results for input(s): AMMONIA in the last 168 hours. Coagulation profile Recent Labs  Lab 11/24/19 1437 11/25/19 0830  INR 1.2 1.3*    CBC: Recent Labs  Lab 11/24/19 1437 11/25/19 0830 11/26/19 0426  WBC 7.9 8.5 5.7  NEUTROABS 3.6  --   --   HGB 10.2* 10.0* 8.6*  HCT 32.1* 31.7* 26.4*  MCV 87.5 88.3 85.4  PLT 209 212 203   Cardiac Enzymes: No results for input(s): CKTOTAL, CKMB, CKMBINDEX, TROPONINI in the last 168 hours. BNP (last 3 results) No results for input(s): PROBNP in the last 8760 hours. CBG: Recent Labs  Lab 11/28/19 1701 11/28/19 2201 11/29/19 0722 11/29/19 1226 11/29/19 1633  GLUCAP 88 105* 81 154* 147*   D-Dimer: No results for input(s): DDIMER in the last 72 hours. Hgb A1c: No results for input(s): HGBA1C in the last 72 hours. Lipid Profile: No results for input(s): CHOL, HDL, LDLCALC, TRIG, CHOLHDL, LDLDIRECT in the last 72 hours. Thyroid function studies: No results for input(s): TSH, T4TOTAL, T3FREE, THYROIDAB in the last 72 hours.  Invalid input(s): FREET3 Anemia work up: No  results for input(s): VITAMINB12, FOLATE, FERRITIN, TIBC, IRON, RETICCTPCT in the last 72 hours. Sepsis Labs: Recent Labs  Lab 11/24/19 1437 11/25/19 0830 11/26/19 0426  WBC 7.9 8.5 5.7    Microbiology Recent Results (from the past 240 hour(s))  Respiratory Panel by RT PCR (Flu A&B, Covid) - Nasopharyngeal Swab     Status: None   Collection Time: 11/24/19  6:19 PM   Specimen: Nasopharyngeal Swab  Result Value Ref Range Status   SARS Coronavirus 2 by RT PCR NEGATIVE NEGATIVE Final    Comment: (NOTE) SARS-CoV-2 target nucleic acids are NOT DETECTED. The SARS-CoV-2 RNA is generally detectable in upper respiratoy specimens during the  acute phase of infection. The lowest concentration of SARS-CoV-2 viral copies this assay can detect is 131 copies/mL. A negative result does not preclude SARS-Cov-2 infection and should not be used as the sole basis for treatment or other patient management decisions. A negative result may occur with  improper specimen collection/handling, submission of specimen other than nasopharyngeal swab, presence of viral mutation(s) within the areas targeted by this assay, and inadequate number of viral copies (<131 copies/mL). A negative result must be combined with clinical observations, patient history, and epidemiological information. The expected result is Negative. Fact Sheet for Patients:  PinkCheek.be Fact Sheet for Healthcare Providers:  GravelBags.it This test is not yet ap proved or cleared by the [place] FDA and  has been authorized for detection and/or diagnosis of SARS-CoV-2 by FDA under an Emergency Use Authorization (EUA). This EUA will remain  in effect (meaning this test can be used) for the duration of the COVID-19 declaration under Section 564(b)(1) of the Act, 21 U.S.C. section 360bbb-3(b)(1), unless the authorization is terminated or revoked sooner.    Influenza A by PCR  NEGATIVE NEGATIVE Final   Influenza B by PCR NEGATIVE NEGATIVE Final    Comment: (NOTE) The Xpert Xpress SARS-CoV-2/FLU/RSV assay is intended as an aid in  the diagnosis of influenza from Nasopharyngeal swab specimens and  should not be used as a sole basis for treatment. Nasal washings and  aspirates are unacceptable for Xpert Xpress SARS-CoV-2/FLU/RSV  testing. Fact Sheet for Patients: PinkCheek.be Fact Sheet for Healthcare Providers: GravelBags.it This test is not yet approved or cleared by the [place] FDA and  has been authorized for detection and/or diagnosis of SARS-CoV-2 by  FDA under an Emergency Use Authorization (EUA). This EUA will remain  in effect (meaning this test can be used) for the duration of the  Covid-19 declaration under Section 564(b)(1) of the Act, 21  U.S.C. section 360bbb-3(b)(1), unless the authorization is  terminated or revoked. Performed at [facility], [address] [place], [place] [address]   Blood culture (routine x 2)     Status: None   Collection Time: 11/24/19  7:46 PM   Specimen: BLOOD  Result Value Ref Range Status   Specimen Description BLOOD LEFT JUGULAR  Final   Special Requests   Final    BOTTLES DRAWN AEROBIC AND ANAEROBIC Blood Culture results may not be optimal due to an inadequate volume of blood received in culture bottles   Culture   Final    NO GROWTH 5 DAYS Performed at [facility], [address] [place], [place] [address]    Report Status 11/29/2019 FINAL  Final  Blood culture (routine x 2)     Status: None   Collection Time: 11/24/19  7:46 PM   Specimen: BLOOD  Result Value Ref Range Status   Specimen Description BLOOD LEFT JUGULAR  Final   Special Requests   Final    BOTTLES DRAWN AEROBIC AND ANAEROBIC Blood Culture adequate volume   Culture   Final    NO GROWTH 5 DAYS Performed at [facility], [address],  [place], [place] [address]    Report Status 11/29/2019 FINAL  Final  MRSA PCR Screening     Status: None   Collection Time: 11/24/19  9:02 PM   Specimen: Nasal Mucosa; Nasopharyngeal  Result Value Ref Range Status   MRSA by PCR NEGATIVE NEGATIVE Final    Comment:        The GeneXpert MRSA Assay (  FDA approved for NASAL specimens only), is one component of a comprehensive MRSA colonization surveillance program. It is not intended to diagnose MRSA infection nor to guide or monitor treatment for MRSA infections. Performed at [facility], [address] [place], [place] [address]   Aerobic/Anaerobic Culture (surgical/deep wound)     Status: None (Preliminary result)   Collection Time: 11/25/19 11:08 AM   Specimen: PATH Other; Tissue  Result Value Ref Range Status   Specimen Description   Final    WOUND LEFT AXILLA Performed at [facility], [address] [place], [place] [address]    Special Requests   Final    NONE Performed at [facility], [place], [place] [address]    Gram Stain   Final    RARE WBC PRESENT,BOTH PMN AND MONONUCLEAR NO ORGANISMS SEEN    Culture   Final    NO GROWTH 4 DAYS Performed at [facility], [place] [address], [place],  [address]    Report Status PENDING  Incomplete  Respiratory Panel by RT PCR (Flu A&B, Covid) - Nasopharyngeal Swab     Status: None   Collection Time: 11/28/19  4:07 PM   Specimen: Nasopharyngeal Swab  Result Value Ref Range Status   SARS Coronavirus 2 by RT PCR NEGATIVE NEGATIVE Final    Comment: (NOTE) SARS-CoV-2 target nucleic acids are NOT DETECTED. The SARS-CoV-2 RNA is generally detectable in upper respiratoy specimens during the acute phase of infection. The lowest concentration of SARS-CoV-2 viral copies this assay can detect is 131 copies/mL. A negative result does not preclude SARS-Cov-2 infection and should not be used as the sole basis for treatment or other patient  management decisions. A negative result may occur with  improper specimen collection/handling, submission of specimen other than nasopharyngeal swab, presence of viral mutation(s) within the areas targeted by this assay, and inadequate number of viral copies (<131 copies/mL). A negative result must be combined with clinical observations, patient history, and epidemiological information. The expected result is Negative. Fact Sheet for Patients:  PinkCheek.be Fact Sheet for Healthcare Providers:  GravelBags.it This test is not yet ap proved or cleared by the [place] FDA and  has been authorized for detection and/or diagnosis of SARS-CoV-2 by FDA under an Emergency Use Authorization (EUA). This EUA will remain  in effect (meaning this test can be used) for the duration of the COVID-19 declaration under Section 564(b)(1) of the Act, 21 U.S.C. section 360bbb-3(b)(1), unless the authorization is terminated or revoked sooner.    Influenza A by PCR NEGATIVE NEGATIVE Final   Influenza B by PCR NEGATIVE NEGATIVE Final    Comment: (NOTE) The Xpert Xpress SARS-CoV-2/FLU/RSV assay is intended as an aid in  the diagnosis of influenza from Nasopharyngeal swab specimens and  should not be used as a sole basis for treatment. Nasal washings and  aspirates are unacceptable for Xpert Xpress SARS-CoV-2/FLU/RSV  testing. Fact Sheet for Patients: PinkCheek.be Fact Sheet for Healthcare Providers: GravelBags.it This test is not yet approved or cleared by the [place] FDA and  has been authorized for detection and/or diagnosis of SARS-CoV-2 by  FDA under an Emergency Use Authorization (EUA). This EUA will remain  in effect (meaning this test can be used) for the duration of the  Covid-19 declaration under Section 564(b)(1) of the Act, 21  U.S.C. section 360bbb-3(b)(1), unless the  authorization is  terminated or revoked. Performed at [facility], [place],  [place], [place] [address]     Procedures and diagnostic studies:  No results found.  Medications:   . amLODipine  10 mg Oral Daily  . vitamin C  500 mg Oral BID  . aspirin EC  81 mg Oral Daily  . atorvastatin  20 mg Oral Daily  . carvedilol  6.25 mg Oral BID  . Chlorhexidine Gluconate Cloth  6 each Topical Q0600  . citalopram  20 mg Oral Daily  . clopidogrel  75 mg Oral Daily  . epoetin (EPOGEN/PROCRIT) injection  4,000 Units Intravenous Q M,W,F-HD  . feeding supplement (PRO-STAT SUGAR FREE 64)  30 mL Oral BID  . FLUoxetine  20 mg Oral QHS  . fluticasone  2 spray Each Nare Daily  . gabapentin  100 mg Oral BID  . gabapentin  200 mg Oral QHS  . hydrALAZINE  10 mg Oral TID  . insulin aspart  0-6 Units Subcutaneous TID WC  . lipase/protease/amylase  36,000 Units Oral TID WC  . meclizine  12.5 mg Oral BID  . multivitamin  1 tablet Oral Daily  . pantoprazole  40 mg Oral Daily  . sevelamer carbonate  2,400 mg Oral TID WC  . sodium chloride flush  10-40 mL Intracatheter Q12H  . sodium chloride flush  3 mL Intravenous Q12H  . cyanocobalamin  1,000 mcg Oral Daily   Continuous Infusions: . sodium chloride Stopped (11/26/19 2149)     LOS: 5 days   Lekeya Rollings  [facility]     11/29/2019, 4:41 PM

## 2019-11-29 NOTE — Progress Notes (Signed)
[name] MRN: [id number] DOB: [birth year]   Cancelled Treatment:    Reason Eval/Treat Not Completed: Other (comment). Treatment attempted, however pt very nauseated and reports mobility increases nausea symptoms. Politely refuses therapy at this time. Of note, plans to dc this date. Will re-attempt next date if pt still hospitalized.   Krishika Bugge 11/29/2019, 9:12 AM [name], PT, DPT [phone]

## 2019-11-29 NOTE — Progress Notes (Signed)
OT Cancellation Note  Patient Details Name: MILTON STREICHER MRN: [id number] DOB: 08-23-1961   Cancelled Treatment:    Reason Eval/Treat Not Completed: Patient declined, no reason specified Pt sitting at EOB with NSG in room and stating that she was very nauseated and had pain 9/10 and declined participation in OT at this time due to nausea and pain.  Will attempt again later today or tomorrow.  Chrys Racer, OTR/L, NTMTC ascom [phone] 11/29/19, 9:39 AM

## 2019-11-30 DIAGNOSIS — Z992 Dependence on renal dialysis: Secondary | ICD-10-CM

## 2019-11-30 DIAGNOSIS — S40022D Contusion of left upper arm, subsequent encounter: Secondary | ICD-10-CM

## 2019-11-30 DIAGNOSIS — N186 End stage renal disease: Secondary | ICD-10-CM

## 2019-11-30 LAB — AEROBIC/ANAEROBIC CULTURE W GRAM STAIN (SURGICAL/DEEP WOUND): Culture: NO GROWTH

## 2019-11-30 LAB — GLUCOSE, CAPILLARY
Glucose-Capillary: 60 mg/dL — ABNORMAL LOW (ref 70–99)
Glucose-Capillary: 72 mg/dL (ref 70–99)

## 2019-11-30 LAB — PHOSPHORUS: Phosphorus: 1.9 mg/dL — ABNORMAL LOW (ref 2.5–4.6)

## 2019-11-30 NOTE — Progress Notes (Signed)
Nutrition Follow Up Note   DOCUMENTATION CODES:   Obesity unspecified  INTERVENTION:   Discontinue Prostat liquid protein as pt is refusing   Add Magic cup TID with meals, each supplement provides 290 kcal and 9 grams of protein  Rena-vite daily   Vitamin C 500mg po BID  Liberalize diet   NUTRITION DIAGNOSIS:   Increased nutrient needs related to chronic illness(ESRD on HD, COPD) as evidenced by increased estimated needs.  GOAL:   Patient will meet greater than or equal to 90% of their needs  -not met  MONITOR:   PO intake, Supplement acceptance, Weight trends, Skin, I & O's  ASSESSMENT:   59 year old female with PMHx of asthma, DM type 2, HTN, CAD, diabetic neuropathy, diabetic gastroparesis, ESRD on HD, GERD, pancreatitis/pancreatic insufficiency, hx MI, mitral regurgitation, COPD, depression, anxiety, diverticulosis, hx of hiatal hernia and PNA who is admitted with significant left arm swelling and pain status post dialysis ligation secondary to severe bleed from her fistula.   Pt in HD at time of RD visit today. Pt continues to have poor appetite and oral intake; per chart, pt eating only sips and bites of meals. Pt is refusing Prostat supplements. Pt does not drink Nepro or Ensure as she reports it gives her diarrhea. RD will liberalize pt's diet to try and encourage increased oral intake. RD will also add Magic Cups to meal trays. Per chart, pt up ~10lbs since admit but appears back to her UBW.   Medications reviewed and include: vitamin C, celexa, plavix, epoetin,  insulin, creon, meclizine, rena-vite, protonix, renvela, B12  Labs reviewed: Na 132(L), K 3.8 wnl, creat 2.89(H), P 2.8 wnl, Mg 1.8 wnl Hgb 8.6(L), Hct 26.4(L) cbgs- 81, 154, 147, 77 x 24 hrs  Diet Order:   Diet Order            Diet regular Room service appropriate? Yes; Fluid consistency: Thin; Fluid restriction: 1200 mL Fluid  Diet effective now        Diet renal 60/70-10-24-1198              EDUCATION NEEDS:   Not appropriate for education at this time  Skin:  Skin Assessment: Reviewed RN Assessment(hematoma L arm)  Last BM:  3/9- type 5  Height:   Ht Readings from Last 1 Encounters:  11/24/19 5' 5" (1.651 m)    Weight:   Wt Readings from Last 1 Encounters:  11/30/19 88.2 kg    Ideal Body Weight:  56.8 kg  BMI:  Body mass index is 32.35 kg/m.  Estimated Nutritional Needs:   Kcal:  1800-2100kcal/day  Protein:  90-105g/day  Fluid:  UOP +1L    MS, RD, LDN Contact information available in Amion  

## 2019-11-30 NOTE — Consult Note (Signed)
[facility] Nurse Consult Note: Reason for Consult: Vac dressing change performed Wound type: Left arm with full thickness post-op wound Wound bed: beefy red Drainage (amount, consistency, odor) small amt bloody drainage from upper deeper area of wound at 12:00 o'clock.  Periwound: Intact skin surrounding.  Staples in place and well approximated to left inner upper arm Dressing procedure/placement/frequency: Applied Mepitel contact layer over lower more shallow wound, and one piece black foam inserted deeper into top of wound and one more piece over lower wound. Pt tolerated with minimal amt discomfort.  Cont suction on at 179mm.  Gauze placed over staples and ace wrap applied to arm.  [place] team will plan to change dressing on Fri if patient is still in the hospital at that time.  Julien Girt MSN, RN, [place], [place], [place]

## 2019-11-30 NOTE — Progress Notes (Addendum)
Hypoglycemic Event  CBG: 60  Treatment: 4 oz juice  Symptoms: asymptomatic   Follow-up CBG: Time:1220 CBG Result:72 pt eating lunch now  Possible Reasons for Event: pt just back from dialysis,did not eat breakfast.   Comments/MD notified: none     Feliberto Gottron

## 2019-11-30 NOTE — Progress Notes (Signed)
[facility], [place] 11/30/19  Subjective:  Patient in good spirits. Seen and evaluated during dialysis treatment. Tolerating well.    HEMODIALYSIS FLOWSHEET:  Blood Flow Rate (mL/min): 300 mL/min Arterial Pressure (mmHg): -130 mmHg Venous Pressure (mmHg): 90 mmHg Transmembrane Pressure (mmHg): 50 mmHg Ultrafiltration Rate (mL/min): 330 mL/min Dialysate Flow Rate (mL/min): 500 ml/min Conductivity: Machine : 14.1 Conductivity: Machine : 14.1 Dialysis Fluid Bolus: Normal Saline Bolus Amount (mL): 250 mL      Objective:  Vital signs in last 24 hours:  Temp:  [97.7 F (36.5 C)-98.4 F (36.9 C)] 97.7 F (36.5 C) (03/10 0422) Pulse Rate:  [59-64] 62 (03/10 0718) Resp:  [11-19] 11 (03/10 0718) BP: (141-163)/(53-77) 158/59 (03/10 0715) SpO2:  [96 %-100 %] 97 % (03/10 0718) Weight:  [88.2 kg] 88.2 kg (03/10 0422)  Weight change: -4.627 kg Filed Weights   11/28/19 0507 11/29/19 0326 11/30/19 0422  Weight: 88 kg 92.8 kg 88.2 kg    Intake/Output:    Intake/Output Summary (Last 24 hours) at 11/30/2019 0951 Last data filed at 11/30/2019 0430 Gross per 24 hour  Intake 1190 ml  Output 0 ml  Net 1190 ml     Physical Exam: General:  No acute distress  HEENT  normocephalic atraumatic hearing intact  Pulm/lungs  clear to auscultation bilateral, normal effort  CVS/Heart  regular, no rubs  Abdomen:   Soft, nontender, nondistended, bowel sounds present  Extremities:  Trace lower extremity edema, mild swelling in left upper      extremity.  Neurologic:  Awake, alert, follows commands  Skin:  Warm, dry  Access:  IJ PermCath.  Wound VAC in old access in left upper extremity.       Basic Metabolic Panel:  Recent Labs  Lab 11/24/19 1437 11/25/19 0830 11/26/19 0426 11/28/19 1011  NA 131* 132* 132*  --   K 3.3* 3.6 3.8  --   CL 95* 97* 96*  --   CO2 29 23 28   --   GLUCOSE 106* 71 235*  --   BUN 5* 10 7  --   CREATININE 3.49* 4.06* 2.89*   --   CALCIUM 8.1* 7.9* 8.0*  --   MG  --  1.8 1.8  --   PHOS  --   --   --  2.8     CBC: Recent Labs  Lab 11/24/19 1437 11/25/19 0830 11/26/19 0426  WBC 7.9 8.5 5.7  NEUTROABS 3.6  --   --   HGB 10.2* 10.0* 8.6*  HCT 32.1* 31.7* 26.4*  MCV 87.5 88.3 85.4  PLT 209 212 203      Lab Results  Component Value Date   HEPBSAG Negative (A) 11/13/2019   HEPBIGM  06/12/2010    NEGATIVE (NOTE) High levels of Hepatitis B Core IgM antibody are detectable during the acute stage of Hepatitis B. This antibody is used to differentiate current from past HBV infection.       Microbiology:  Recent Results (from the past 240 hour(s))  Respiratory Panel by RT PCR (Flu A&B, Covid) - Nasopharyngeal Swab     Status: None   Collection Time: 11/24/19  6:19 PM   Specimen: Nasopharyngeal Swab  Result Value Ref Range Status   SARS Coronavirus 2 by RT PCR NEGATIVE NEGATIVE Final    Comment: (NOTE) SARS-CoV-2 target nucleic acids are NOT DETECTED. The SARS-CoV-2 RNA is generally detectable in upper respiratoy specimens during the acute phase of infection. The lowest concentration of SARS-CoV-2 viral  copies this assay can detect is 131 copies/mL. A negative result does not preclude SARS-Cov-2 infection and should not be used as the sole basis for treatment or other patient management decisions. A negative result may occur with  improper specimen collection/handling, submission of specimen other than nasopharyngeal swab, presence of viral mutation(s) within the areas targeted by this assay, and inadequate number of viral copies (<131 copies/mL). A negative result must be combined with clinical observations, patient history, and epidemiological information. The expected result is Negative. Fact Sheet for Patients:  PinkCheek.be Fact Sheet for Healthcare Providers:  GravelBags.it This test is not yet ap proved or cleared by the [place] FDA and  has been authorized for detection and/or diagnosis of SARS-CoV-2 by FDA under an Emergency Use Authorization (EUA). This EUA will remain  in effect (meaning this test can be used) for the duration of the COVID-19 declaration under Section 564(b)(1) of the Act, 21 U.S.C. section 360bbb-3(b)(1), unless the authorization is terminated or revoked sooner.    Influenza A by PCR NEGATIVE NEGATIVE Final   Influenza B by PCR NEGATIVE NEGATIVE Final    Comment: (NOTE) The Xpert Xpress SARS-CoV-2/FLU/RSV assay is intended as an aid in  the diagnosis of influenza from Nasopharyngeal swab specimens and  should not be used as a sole basis for treatment. Nasal washings and  aspirates are unacceptable for Xpert Xpress SARS-CoV-2/FLU/RSV  testing. Fact Sheet for Patients: PinkCheek.be Fact Sheet for Healthcare Providers: GravelBags.it This test is not yet approved or cleared by the [place] FDA and  has been authorized for detection and/or diagnosis of SARS-CoV-2 by  FDA under an Emergency Use Authorization (EUA). This EUA will remain  in effect (meaning this test can be used) for the duration of the  Covid-19 declaration under Section 564(b)(1) of the Act, 21  U.S.C. section 360bbb-3(b)(1), unless the authorization is  terminated or revoked. Performed at [facility], [address] [place], [place] [address]   Blood culture (routine x 2)     Status: None   Collection Time: 11/24/19  7:46 PM   Specimen: BLOOD  Result Value Ref Range Status   Specimen Description BLOOD LEFT JUGULAR  Final   Special Requests   Final    BOTTLES DRAWN AEROBIC AND ANAEROBIC Blood Culture results may not be optimal due to an inadequate volume of blood received in culture bottles   Culture   Final    NO GROWTH 5 DAYS Performed at [facility], [address], [place], [place] [address]    Report Status 11/29/2019  FINAL  Final  Blood culture (routine x 2)     Status: None   Collection Time: 11/24/19  7:46 PM   Specimen: BLOOD  Result Value Ref Range Status   Specimen Description BLOOD LEFT JUGULAR  Final   Special Requests   Final    BOTTLES DRAWN AEROBIC AND ANAEROBIC Blood Culture adequate volume   Culture   Final    NO GROWTH 5 DAYS Performed at [facility], [address] [place], [place] [address]    Report Status 11/29/2019 FINAL  Final  MRSA PCR Screening     Status: None   Collection Time: 11/24/19  9:02 PM   Specimen: Nasal Mucosa; Nasopharyngeal  Result Value Ref Range Status   MRSA by PCR NEGATIVE NEGATIVE Final    Comment:        The GeneXpert MRSA Assay (FDA approved for NASAL specimens only), is one component of  a comprehensive MRSA colonization surveillance program. It is not intended to diagnose MRSA infection nor to guide or monitor treatment for MRSA infections. Performed at [facility], [address] [place], [place] [address]   Aerobic/Anaerobic Culture (surgical/deep wound)     Status: None (Preliminary result)   Collection Time: 11/25/19 11:08 AM   Specimen: PATH Other; Tissue  Result Value Ref Range Status   Specimen Description   Final    WOUND LEFT AXILLA Performed at [facility], [address], [place], [place] [address]    Special Requests   Final    NONE Performed at [facility], [place], [place] [address]    Gram Stain   Final    RARE WBC PRESENT,BOTH PMN AND MONONUCLEAR NO ORGANISMS SEEN    Culture   Final    NO GROWTH 4 DAYS Performed at [facility], [place] [address], [place], [place] [address]    Report Status PENDING  Incomplete  Respiratory Panel by RT PCR (Flu A&B, Covid) - Nasopharyngeal Swab     Status: None   Collection Time: 11/28/19  4:07 PM   Specimen: Nasopharyngeal Swab  Result Value Ref Range Status   SARS Coronavirus 2 by RT PCR NEGATIVE NEGATIVE Final     Comment: (NOTE) SARS-CoV-2 target nucleic acids are NOT DETECTED. The SARS-CoV-2 RNA is generally detectable in upper respiratoy specimens during the acute phase of infection. The lowest concentration of SARS-CoV-2 viral copies this assay can detect is 131 copies/mL. A negative result does not preclude SARS-Cov-2 infection and should not be used as the sole basis for treatment or other patient management decisions. A negative result may occur with  improper specimen collection/handling, submission of specimen other than nasopharyngeal swab, presence of viral mutation(s) within the areas targeted by this assay, and inadequate number of viral copies (<131 copies/mL). A negative result must be combined with clinical observations, patient history, and epidemiological information. The expected result is Negative. Fact Sheet for Patients:  PinkCheek.be Fact Sheet for Healthcare Providers:  GravelBags.it This test is not yet ap proved or cleared by the [place] FDA and  has been authorized for detection and/or diagnosis of SARS-CoV-2 by FDA under an Emergency Use Authorization (EUA). This EUA will remain  in effect (meaning this test can be used) for the duration of the COVID-19 declaration under Section 564(b)(1) of the Act, 21 U.S.C. section 360bbb-3(b)(1), unless the authorization is terminated or revoked sooner.    Influenza A by PCR NEGATIVE NEGATIVE Final   Influenza B by PCR NEGATIVE NEGATIVE Final    Comment: (NOTE) The Xpert Xpress SARS-CoV-2/FLU/RSV assay is intended as an aid in  the diagnosis of influenza from Nasopharyngeal swab specimens and  should not be used as a sole basis for treatment. Nasal washings and  aspirates are unacceptable for Xpert Xpress SARS-CoV-2/FLU/RSV  testing. Fact Sheet for Patients: PinkCheek.be Fact Sheet for Healthcare  Providers: GravelBags.it This test is not yet approved or cleared by the [place] FDA and  has been authorized for detection and/or diagnosis of SARS-CoV-2 by  FDA under an Emergency Use Authorization (EUA). This EUA will remain  in effect (meaning this test can be used) for the duration of the  Covid-19 declaration under Section 564(b)(1) of the Act, 21  U.S.C. section 360bbb-3(b)(1), unless the authorization is  terminated or revoked. Performed at [facility], [address], [place], [place] [address]     Coagulation Studies: No  results for input(s): LABPROT, INR in the last 72 hours.  Urinalysis: No results for input(s): COLORURINE, LABSPEC, PHURINE, GLUCOSEU, HGBUR, BILIRUBINUR, KETONESUR, PROTEINUR, UROBILINOGEN, NITRITE, LEUKOCYTESUR in the last 72 hours.  Invalid input(s): APPERANCEUR    Imaging: No results found.   Medications:   . sodium chloride Stopped (11/26/19 2149)   . amLODipine  10 mg Oral Daily  . vitamin C  500 mg Oral BID  . aspirin EC  81 mg Oral Daily  . atorvastatin  20 mg Oral Daily  . carvedilol  6.25 mg Oral BID  . Chlorhexidine Gluconate Cloth  6 each Topical Q0600  . citalopram  20 mg Oral Daily  . clopidogrel  75 mg Oral Daily  . epoetin (EPOGEN/PROCRIT) injection  4,000 Units Intravenous Q M,W,F-HD  . feeding supplement (PRO-STAT SUGAR FREE 64)  30 mL Oral BID  . FLUoxetine  20 mg Oral QHS  . fluticasone  2 spray Each Nare Daily  . gabapentin  100 mg Oral BID  . gabapentin  200 mg Oral QHS  . hydrALAZINE  10 mg Oral TID  . insulin aspart  0-6 Units Subcutaneous TID WC  . lipase/protease/amylase  36,000 Units Oral TID WC  . meclizine  12.5 mg Oral BID  . multivitamin  1 tablet Oral Daily  . pantoprazole  40 mg Oral Daily  . sevelamer carbonate  2,400 mg Oral TID WC  . sodium chloride flush  10-40 mL Intracatheter Q12H  . sodium chloride flush  3 mL Intravenous Q12H  . cyanocobalamin  1,000  mcg Oral Daily   sodium chloride, acetaminophen **OR** acetaminophen, albuterol, diphenhydrAMINE, hydrALAZINE, LORazepam, nitroGLYCERIN, oxyCODONE, promethazine, sodium chloride flush  Assessment/ Plan:  59 y.o. female with end stage renal disease on hemodialysis for last fifteen years, pancreatic insufficiency, hypertension, peripheral vascular disease, GERD, diabetes mellitus type II, COPD  Principal Problem:   Hematoma of arm, left, subsequent encounter Active Problems:   End-stage renal disease on hemodialysis ([place])  [facility] M[facility]   #. ESRD Patient seen and evaluated during dialysis treatment.  Tolerating well.  We plan to complete dialysis treatment today.  #. Anemia of CKD  Lab Results  Component Value Date   HGB 8.6 (L) 11/26/2019   Administer Epogen 4000 IV with dialysis today.  #. SHPTH     Component Value Date/Time   PTH 357 (H) 10/05/2018 1241   Lab Results  Component Value Date   PHOS 2.8 11/28/2019   Repeat serum phosphorus today..  #. Diabetes type 2 with CKD Hemoglobin A1C (%)  Date Value  07/19/2013 9.3 (H)   Hgb A1c MFr Bld (%)  Date Value  11/15/2019 4.7 (L)   Glycemic control as per hospitalist.   LOS: 6 [name] 3/10/20219:51 AM  [place], [place]

## 2019-11-30 NOTE — Progress Notes (Signed)
Report given to Kim at [facility]. EMS here to transport patient. Discharge instructions gone over. Tele monitor off. Patient does not have peripheral IV access nor central line any longer.

## 2019-11-30 NOTE — Progress Notes (Signed)
HD Tx started w/o complication    98/06/99 0718  Vital Signs  Pulse Rate 62  Pulse Rate Source Monitor  Resp 11  BP (!) 156/62  BP Location Right Arm  BP Method Automatic  Patient Position (if appropriate) Lying  Oxygen Therapy  SpO2 97 %  O2 Device Room Air  During Hemodialysis Assessment  Blood Flow Rate (mL/min) 400 mL/min  Arterial Pressure (mmHg) -130 mmHg  Venous Pressure (mmHg) 110 mmHg  Transmembrane Pressure (mmHg) 40 mmHg  Ultrafiltration Rate (mL/min) 710 mL/min  Dialysate Flow Rate (mL/min) 800 ml/min  Conductivity: Machine  13.6  HD Safety Checks Performed Yes  Dialysis Fluid Bolus Normal Saline  Bolus Amount (mL) 250 mL  Intra-Hemodialysis Comments Progressing as prescribed

## 2019-11-30 NOTE — TOC Transition Note (Signed)
Transition of Care [facility]) - CM/SW Discharge Note   Patient Details  Name: CAMRYNN MCCLINTIC MRN: [id number] Date of Birth: [birth year]  [facility] CM/SW Contact:  Eileen Stanford, LCSW Phone Number: 11/30/2019, 12:04 PM   Clinical Narrative:   Clinical Social Worker facilitated patient discharge including contacting patient family and facility to confirm patient discharge plans.  Clinical information faxed to facility and family agreeable with plan.  CSW arranged ambulance transport via [facility] to [place].  RN to call for report prior to discharge.    Final next level of care: Skilled Nursing Facility Barriers to Discharge: No Barriers Identified   Patient Goals and CMS Choice Patient states their goals for this hospitalization and ongoing recovery are:: to return to snf      Discharge Placement              Patient chooses bed at: [facility] [place] Patient to be transferred to facility by: [facility] Name of family member notified: Pt alert and oriented Patient and family notified of of transfer: 11/30/19  Discharge Plan and Services In-house Referral: NA   Post Acute Care Choice: [place]                               Social Determinants of Health (SDOH) Interventions     Readmission Risk Interventions No flowsheet data found.

## 2019-11-30 NOTE — Progress Notes (Signed)
Pre HD Tx   11/30/19 0715  Hand-Off documentation  Report given to (Full Name) Robin Ship, RN   Report received from (Full Name) Rhys Martini, RN  Vital Signs  Temp 97.6 F (36.4 C)  Temp Source Oral  Pulse Rate 62  Pulse Rate Source Monitor  BP (!) 158/59  BP Location Right Arm  BP Method Automatic  Patient Position (if appropriate) Lying  Oxygen Therapy  SpO2 97 %  O2 Device Room Air  Pain Assessment  Pain Scale 0-10  Pain Score 0  Dialysis Weight  Weight 88.1 kg  Type of Weight Pre-Dialysis  Time-Out for Hemodialysis  What Procedure? HD  Pt Identifiers(min of two) First/Last Name;MRN/Account#  Correct Site? Yes  Correct Side? Yes  Correct Procedure? Yes  Consents Verified? Yes  Rad Studies Available? N/A  Safety Precautions Reviewed? Yes  Engineer, civil (consulting) Number [place] Number 2  UF/Alarm Test Passed  Conductivity: Meter 13.6  Conductivity: Machine  13.6  pH 7.2  Reverse Osmosis Main  Normal Saline Lot Number [id number]  Dialyzer Lot Number [id number]  Machine Temperature 98.6 F (37 C)  Musician and Audible Yes  Blood Lines Intact and Secured Yes  Pre Treatment Patient Checks  Vascular access used during treatment Catheter  HD catheter dressing before treatment WDL  Hepatitis B Surface Antigen Results Negative  Date Hepatitis B Surface Antigen Drawn 11/13/19  Hepatitis B Surface Antibody 1000  Date Hepatitis B Surface Antibody Drawn 11/28/19  Hemodialysis Consent Verified Yes  Hemodialysis Standing Orders Initiated Yes  ECG (Telemetry) Monitor On Yes  Prime Ordered Normal Saline  Length of  DialysisTreatment -hour(s) 3.5 Hour(s)  Dialysis Treatment Comments Na 140  Dialyzer Elisio 17H NR  Dialysate 2K;2.5 Ca  Dialysis Anticoagulant None  Dialysate Flow Ordered 800  Blood Flow Rate Ordered 400 mL/min  Ultrafiltration Goal 2 Liters  Dialysis Blood Pressure Support Ordered Normal Saline  Education / Care Plan  Dialysis Education  Provided Yes  Documented Education in Care Plan Yes  Hemodialysis Catheter Right Subclavian  Placement Date/Time: 11/14/19 1648   Time Out: Correct patient;Correct site;Correct procedure  Maximum sterile barrier precautions: Hand hygiene;Cap;Mask;Sterile gown;Sterile gloves;Large sterile sheet  Site Prep: Chlorhexidine (preferred)  Local Anes...  Site Condition No complications  Blue Lumen Status Blood return noted  Red Lumen Status Blood return noted  Purple Lumen Status N/A  Dressing Type Biopatch;Occlusive  Dressing Status Clean;Dry;Intact

## 2019-11-30 NOTE — Progress Notes (Signed)
OT Cancellation Note  Patient Details Name: [name] MRN: [id number] DOB: 08/28/1961   Cancelled Treatment:    Reason Eval/Treat Not Completed: Patient at procedure or test/ unavailable  Pt off floor at HD at this time. Will f/u as able for OT tx. Thank you.  Gerrianne Scale, [place], OTR/L ascom [phone] 11/30/19, 8:13 AM

## 2019-11-30 NOTE — Progress Notes (Deleted)
Right groin trialysis catheter dressing noted to be saturated, including gauze wrapped around dialysis ports (blue and red). Blood seeping into perineal area. Area cleaned and dressing changed. Site unremarkable, except suture site is oozing. Pt without complaints. No hematoma or bleeding from actual catheter site noted. Dialysis personal updated regarding catheter dressing change. Pt is scheduled for hemodialysis today. Transporter in to transport pt to dialysis.

## 2019-11-30 NOTE — Plan of Care (Signed)

## 2019-12-06 ENCOUNTER — Ambulatory Visit (INDEPENDENT_AMBULATORY_CARE_PROVIDER_SITE_OTHER): Payer: Medicare Other | Admitting: Nurse Practitioner

## 2019-12-06 ENCOUNTER — Other Ambulatory Visit: Payer: Self-pay

## 2019-12-06 ENCOUNTER — Encounter (INDEPENDENT_AMBULATORY_CARE_PROVIDER_SITE_OTHER): Payer: Self-pay | Admitting: Nurse Practitioner

## 2019-12-06 ENCOUNTER — Telehealth (INDEPENDENT_AMBULATORY_CARE_PROVIDER_SITE_OTHER): Payer: Self-pay

## 2019-12-06 VITALS — BP 170/71 | HR 58 | Ht 65.0 in | Wt 185.0 lb

## 2019-12-06 DIAGNOSIS — T829XXD Unspecified complication of cardiac and vascular prosthetic device, implant and graft, subsequent encounter: Secondary | ICD-10-CM

## 2019-12-06 DIAGNOSIS — S40022D Contusion of left upper arm, subsequent encounter: Secondary | ICD-10-CM

## 2019-12-06 NOTE — Telephone Encounter (Signed)
The patient had a previous order to have her wound vac changed every other day.  I would like for them to continue with that order.  It was taken off today because the wound vac didn't have any suction and blood was beginning to pool under her dressing, increasing risk of infection.

## 2019-12-06 NOTE — Telephone Encounter (Signed)
Pt's Social worker called from [facility] wanting to get more information on the wound vac I called to see just what information he need and I had to leave him a voicemail to call back with more details.

## 2019-12-07 ENCOUNTER — Other Ambulatory Visit
Admission: RE | Admit: 2019-12-07 | Discharge: 2019-12-07 | Disposition: A | Discharge status: Home / Self Care | Payer: Medicare Other | Source: Ambulatory Visit | Attending: Nephrology | Admitting: Nephrology

## 2019-12-07 DIAGNOSIS — N186 End stage renal disease: Secondary | ICD-10-CM | POA: Insufficient documentation

## 2019-12-07 LAB — CBC WITH DIFFERENTIAL/PLATELET
Abs Immature Granulocytes: 0.04 10*3/uL (ref 0.00–0.07)
Basophils Absolute: 0.1 10*3/uL (ref 0.0–0.1)
Basophils Relative: 1 %
Eosinophils Absolute: 1.9 10*3/uL — ABNORMAL HIGH (ref 0.0–0.5)
Eosinophils Relative: 22 %
HCT: 25.6 % — ABNORMAL LOW (ref 36.0–46.0)
Hemoglobin: 8.3 g/dL — ABNORMAL LOW (ref 12.0–15.0)
Immature Granulocytes: 1 %
Lymphocytes Relative: 15 %
Lymphs Abs: 1.3 10*3/uL (ref 0.7–4.0)
MCH: 28 pg (ref 26.0–34.0)
MCHC: 32.4 g/dL (ref 30.0–36.0)
MCV: 86.5 fL (ref 80.0–100.0)
Monocytes Absolute: 0.7 10*3/uL (ref 0.1–1.0)
Monocytes Relative: 9 %
Neutro Abs: 4.5 10*3/uL (ref 1.7–7.7)
Neutrophils Relative %: 52 %
Platelets: 181 10*3/uL (ref 150–400)
RBC: 2.96 MIL/uL — ABNORMAL LOW (ref 3.87–5.11)
RDW: 17.1 % — ABNORMAL HIGH (ref 11.5–15.5)
WBC: 8.5 10*3/uL (ref 4.0–10.5)
nRBC: 0 % (ref 0.0–0.2)

## 2019-12-07 LAB — COMPREHENSIVE METABOLIC PANEL
ALT: 5 U/L (ref 0–44)
AST: 17 U/L (ref 15–41)
Albumin: 2.6 g/dL — ABNORMAL LOW (ref 3.5–5.0)
Alkaline Phosphatase: 78 U/L (ref 38–126)
Anion gap: 8 (ref 5–15)
BUN: 10 mg/dL (ref 6–20)
CO2: 26 mmol/L (ref 22–32)
Calcium: 8.1 mg/dL — ABNORMAL LOW (ref 8.9–10.3)
Chloride: 92 mmol/L — ABNORMAL LOW (ref 98–111)
Creatinine, Ser: 4.21 mg/dL — ABNORMAL HIGH (ref 0.44–1.00)
GFR calc Af Amer: 12 mL/min — ABNORMAL LOW (ref >60)
GFR calc non Af Amer: 11 mL/min — ABNORMAL LOW (ref >60)
Glucose, Bld: 113 mg/dL — ABNORMAL HIGH (ref 70–99)
Potassium: 4.3 mmol/L (ref 3.5–5.1)
Sodium: 126 mmol/L — ABNORMAL LOW (ref 135–145)
Total Bilirubin: 0.6 mg/dL (ref 0.3–1.2)
Total Protein: 5.6 g/dL — ABNORMAL LOW (ref 6.5–8.1)

## 2019-12-07 NOTE — Telephone Encounter (Signed)
Shawn franks from [facility] called back requesting for code for wound vac and the cpt code [phone] has been giving.

## 2019-12-08 ENCOUNTER — Encounter (INDEPENDENT_AMBULATORY_CARE_PROVIDER_SITE_OTHER): Payer: Self-pay | Admitting: Nurse Practitioner

## 2019-12-08 NOTE — Progress Notes (Signed)
SUBJECTIVE:  Patient ID: [name], female    DOB: [birth year], 59 y.o.   MRN: [id number] Chief Complaint  Patient presents with  . Follow-up    2 week [facility] post op    HPI  [name] is a 59 y.o. female the presents today after an extended hospitalization due to bleeding from her left brachial axillary graft.  This occurred on 11/14/2019.  The patient also ultimately needed to have a hematoma drained from her left axillary area on 11/25/2019.  Today the patient states that the wounds are doing better however she has more complaints about the nursing facility and the care there versus the actual wound.  There is some concern that her current wound VAC is not placed properly due to the fact that there is blood pooling under the dressing and none is draining.  The patient states that the swelling is gone down well and and that her wound has gotten smaller in size.  She denies any fever, chills, nausea, vomiting or diarrhea.  The patient states that she has been having some difficulties with pain control, mostly because she is not getting her pain medication on a regular basis.  Temporary dialysis access is doing well.  Overall as far as the status of her wound it is doing well.  Past Medical History:  Diagnosis Date  . Anemia   . Anginal pain ([place])   . Anxiety   . Arthritis   . Asthma   . Broken wrist   . Bronchitis   . chronic diastolic CHF 4/43/1540  . Chronic kidney disease   . COPD (chronic obstructive pulmonary disease) ([place])   . Coronary artery disease    a. cath 2013: stenting to RCA (report not available); b. cath 2014: LM nl, pLAD 40%, mLAD nl, ost LCx 40%, mid LCx nl, pRCA 30% @ site of prior stent, mRCA 50%  . Depression   . Diabetes mellitus ([place])   . Diabetes mellitus without complication ([place])   . Diabetic neuropathy ([place])   . dialysis 2006  . Diverticulosis   . Dizziness   . Dyspnea   . Elevated lipids   . Environmental and seasonal allergies   . ESRD (end stage  renal disease) on dialysis ([place])    M-W-F  . Gastroparesis   . GERD (gastroesophageal reflux disease)   . Headache   . History of anemia due to chronic kidney disease   . History of hiatal hernia   . HOH (hard of hearing)   . Hx of pancreatitis 2015  . Hypertension   . Lower extremity edema   . Mitral regurgitation    a. echo 10/2013: EF 62%, noWMA, mildly dilated LA, mild to mod MR/TR, GR1DD  . Myocardial infarction ([place])   . Orthopnea   . Parathyroid abnormality ([place])   . Peripheral arterial disease ([place])   . Pneumonia   . Renal cancer ([place])   . Renal insufficiency    Pt is on dialysis on M,W + F.  . Wheezing     Past Surgical History:  Procedure Laterality Date  . A/V SHUNTOGRAM Left 01/20/2018   Procedure: A/V SHUNTOGRAM;  Surgeon: Algernon Huxley, MD;  Location: [place] [facility];  Service: Cardiovascular;  Laterality: Left;  . ABDOMINAL HYSTERECTOMY  1992  . AMPUTATION TOE Left 10/02/2017   Procedure: AMPUTATION TOE-LEFT GREAT TOE;  Surgeon: Albertine Patricia, DPM;  Location: [facility];  Service: Podiatry;  Laterality: Left;  . APPENDECTOMY    .  APPLICATION OF WOUND VAC N/A 11/25/2019   Procedure: APPLICATION OF WOUND VAC;  Surgeon: Katha Cabal, MD;  Location: [facility];  Service: Vascular;  Laterality: N/A;  . ARTERY BIOPSY Right 10/11/2018   Procedure: BIOPSY TEMPORAL ARTERY;  Surgeon: Vickie Epley, MD;  Location: [facility];  Service: General;  Laterality: Right;  . CARDIAC CATHETERIZATION Left 07/26/2015   Procedure: Left Heart Cath and Coronary Angiography;  Surgeon: Dionisio David, MD;  Location: [place] [facility];  Service: Cardiovascular;  Laterality: Left;  . CATARACT EXTRACTION W/ INTRAOCULAR LENS IMPLANT Right   . CATARACT EXTRACTION W/PHACO Left 03/10/2017   Procedure: CATARACT EXTRACTION PHACO AND INTRAOCULAR LENS PLACEMENT (IOC);  Surgeon: Birder Robson, MD;  Location: [facility];  Service: Ophthalmology;  Laterality: Left;  [place] 00:51.9 AP% 14.2 CDE  7.39 fluid pack lot # [id number]  . CENTRAL LINE INSERTION Right 11/11/2019   Procedure: CENTRAL LINE INSERTION;  Surgeon: Katha Cabal, MD;  Location: [facility];  Service: Vascular;  Laterality: Right;  . CENTRAL LINE INSERTION  11/25/2019   Procedure: CENTRAL LINE INSERTION;  Surgeon: Katha Cabal, MD;  Location: [facility];  Service: Vascular;;  . CHOLECYSTECTOMY    . COLONOSCOPY WITH PROPOFOL N/A 08/12/2016   Procedure: COLONOSCOPY WITH PROPOFOL;  Surgeon: Lollie Sails, MD;  Location: [facility];  Service: Endoscopy;  Laterality: N/A;  . DIALYSIS FISTULA CREATION Left    upper arm  . dialysis grafts    . DIALYSIS/PERMA CATHETER INSERTION N/A 11/14/2019   Procedure: DIALYSIS/PERMA CATHETER INSERTION;  Surgeon: Algernon Huxley, MD;  Location: [place] [facility];  Service: Cardiovascular;  Laterality: N/A;  . ESOPHAGOGASTRODUODENOSCOPY N/A 03/08/2015   Procedure: ESOPHAGOGASTRODUODENOSCOPY (EGD);  Surgeon: Manya Silvas, MD;  Location: [facility] ENDOSCOPY;  Service: Endoscopy;  Laterality: N/A;  . ESOPHAGOGASTRODUODENOSCOPY (EGD) WITH PROPOFOL N/A 03/18/2016   Procedure: ESOPHAGOGASTRODUODENOSCOPY (EGD) WITH PROPOFOL;  Surgeon: [name], MD;  Location: [facility] ENDOSCOPY;  Service: Endoscopy;  Laterality: N/A;  . EYE SURGERY Right 2018  . FECAL TRANSPLANT N/A 08/23/2015   Procedure: FECAL TRANSPLANT;  Surgeon: Manya Silvas, MD;  Location: [facility] ENDOSCOPY;  Service: Endoscopy;  Laterality: N/A;  . HAND SURGERY Bilateral   . HEMATOMA EVACUATION Left 11/25/2019   Procedure: EVACUATION HEMATOMA;  Surgeon: Katha Cabal, MD;  Location: [facility];  Service: Vascular;  Laterality: Left;  . I & D EXTREMITY Left 11/25/2019   Procedure: IRRIGATION AND DEBRIDEMENT EXTREMITY;  Surgeon: Katha Cabal, MD;  Location: [facility];  Service: Vascular;  Laterality: Left;  . IR RADIOLOGIST EVAL & MGMT  07/28/2019  . IR RADIOLOGIST EVAL & MGMT  08/11/2019  . LIGATION OF ARTERIOVENOUS  FISTULA Left  11/11/2019  . LIGATION OF ARTERIOVENOUS  FISTULA Left 11/11/2019   Procedure: LIGATION OF ARTERIOVENOUS  FISTULA;  Surgeon: Katha Cabal, MD;  Location: [facility];  Service: Vascular;  Laterality: Left;  . PERIPHERAL VASCULAR CATHETERIZATION N/A 12/20/2015   Procedure: Thrombectomy of dialysis access versus permcath placement;  Surgeon: Algernon Huxley, MD;  Location: [place] [facility];  Service: Cardiovascular;  Laterality: N/A;  . PERIPHERAL VASCULAR CATHETERIZATION N/A 12/20/2015   Procedure: A/V Shunt Intervention;  Surgeon: Algernon Huxley, MD;  Location: [place] [facility];  Service: Cardiovascular;  Laterality: N/A;  . PERIPHERAL VASCULAR CATHETERIZATION N/A 12/20/2015   Procedure: A/V Shuntogram/Fistulagram;  Surgeon: Algernon Huxley, MD;  Location: [place] [facility];  Service: Cardiovascular;  Laterality: N/A;  . PERIPHERAL VASCULAR CATHETERIZATION N/A 01/02/2016  Procedure: A/V Shuntogram/Fistulagram;  Surgeon: Algernon Huxley, MD;  Location: [place] [facility];  Service: Cardiovascular;  Laterality: N/A;  . PERIPHERAL VASCULAR CATHETERIZATION N/A 01/02/2016   Procedure: A/V Shunt Intervention;  Surgeon: Algernon Huxley, MD;  Location: [place] [facility];  Service: Cardiovascular;  Laterality: N/A;    Social History   Socioeconomic History  . Marital status: Divorced    Spouse name: Not on file  . Number of children: Not on file  . Years of education: Not on file  . Highest education level: Not on file  Occupational History  . Not on file  Tobacco Use  . Smoking status: Never Smoker  . Smokeless tobacco: Never Used  Substance and Sexual Activity  . Alcohol use: Not Currently    Comment: glass wine week per pt  . Drug use: Yes    Types: Marijuana    Comment: once a day  . Sexual activity: Never  Other Topics Concern  . Not on file  Social History Narrative   ** Merged History Encounter **       Social Determinants of Health   Financial Resource Strain:   . Difficulty of  Paying Living Expenses:   Food Insecurity:   . Worried About Charity fundraiser in the Last Year:   . Arboriculturist in the Last Year:   Transportation Needs:   . Film/video editor (Medical):   Marland Kitchen Lack of Transportation (Non-Medical):   Physical Activity:   . Days of Exercise per Week:   . Minutes of Exercise per Session:   Stress:   . Feeling of Stress :   Social Connections:   . Frequency of Communication with Friends and Family:   . Frequency of Social Gatherings with Friends and Family:   . Attends Religious Services:   . Active Member of Clubs or Organizations:   . Attends Archivist Meetings:   Marland Kitchen Marital Status:   Intimate Partner Violence:   . Fear of Current or Ex-Partner:   . Emotionally Abused:   Marland Kitchen Physically Abused:   . Sexually Abused:     Family History  Problem Relation Age of Onset  . Kidney disease Mother   . Diabetes Mother   . Cancer Father   . Kidney disease Sister     Allergies  Allergen Reactions  . Ace Inhibitors Swelling and Anaphylaxis  . Ativan [Lorazepam] Other (See Comments)    Reaction:Hallucinations and headaches  . Compazine [Prochlorperazine Edisylate] Anaphylaxis, Nausea And Vomiting and Other (See Comments)    Other reaction(s): dystonia from this vs. Reglan, 23 Jul - patient relates that she takes promethazine frequently with no problems  . Sumatriptan Succinate Other (See Comments)    Other reaction(s): delirium and hallucinations per [facility] records  . Dilaudid [Hydromorphone Hcl] Other (See Comments)    Delirium   . Ondansetron Other (See Comments)    hallucinations    . Zofran [Ondansetron Hcl] Other (See Comments)    Reaction:  hallucinations   . Dilaudid [Hydromorphone]   . Zofran [Ondansetron]     Per pt. she is allergic to zofran or will experience adverse reaction like hallucination   . Codeine Nausea And Vomiting  . Gabapentin Other (See Comments)    Reaction:  Unknown    . [place] Nausea And  Vomiting  . Losartan Nausea Only  . Oxycodone Anxiety  . Prochlorperazine Other (See Comments)    Reaction:  Unknown . Patient does not  remember reaction but she does have vertigo and anxiety along with n and v at times. Could be used to treat any of these   . Reglan [Metoclopramide] Other (See Comments)    Per patient her Dr. Evelina Bucy her off it   . Scopolamine Other (See Comments)    Dizziness, also has vertigo already  . Tape Rash  . Tapentadol Rash     Review of Systems   Review of Systems: Negative Unless Checked Constitutional: [] Weight loss  [] Fever  [] Chills Cardiac: [] Chest pain   []  Atrial Fibrillation  [] Palpitations   [] Shortness of breath when laying flat   [] Shortness of breath with exertion. [] Shortness of breath at rest Vascular:  [] Pain in legs with walking   [] Pain in legs with standing [] Pain in legs when laying flat   [] Claudication    [] Pain in feet when laying flat    [] History of DVT   [] Phlebitis   [x] Swelling in legs   [] Varicose veins   [] Non-healing ulcers Pulmonary:   [] Uses home oxygen   [] Productive cough   [] Hemoptysis   [] Wheeze  [] COPD   [] Asthma Neurologic:  [] Dizziness   [] Seizures  [] Blackouts [] History of stroke   [x] History of TIA  [] Aphasia   [] Temporary Blindness   [] Weakness or numbness in arm   [x] Weakness or numbness in leg Musculoskeletal:   [] Joint swelling   [] Joint pain   [] Low back pain  []  History of Knee Replacement [] Arthritis [] back Surgeries  []  Spinal Stenosis    Hematologic:  [] Easy bruising  [] Easy bleeding   [] Hypercoagulable state   [x] Anemic Gastrointestinal:  [] Diarrhea   [] Vomiting  [] Gastroesophageal reflux/heartburn   [] Difficulty swallowing. [] Abdominal pain Genitourinary:  [x] Chronic kidney disease   [] Difficult urination  [] Anuric   [] Blood in urine [] Frequent urination  [] Burning with urination   [] Hematuria Skin:  [] Rashes   [] Ulcers [x] Wounds Psychological:  [] History of anxiety   [x]  History of major depression  []  Memory  Difficulties      OBJECTIVE:   Physical Exam  BP (!) 170/71   Pulse (!) 58   Ht 5\' 5"  (1.651 m)   Wt 185 lb (83.9 kg)   BMI 30.79 kg/m   Gen: WD/WN, NAD Head: [place]/AT, No temporalis wasting.  Ear/Nose/Throat: Hearing grossly intact, nares w/o erythema or drainage Eyes: PER, EOMI, sclera nonicteric.  Neck: Supple, no masses.  No JVD.  Pulmonary:  Good air movement, no use of accessory muscles.  Cardiac: RRR Vascular:  Incision and axillary area well approximated healing well.  Larger incision in upper arm has good wound bed looks clean dry and intact with no evidence of infection.  Becoming smaller from previous pictures. Vessel Right Left  Radial Palpable Palpable   Gastrointestinal: soft, non-distended. No guarding/no peritoneal signs.  Musculoskeletal: M/S 5/5 throughout.  No deformity or atrophy.  Neurologic: Pain and light touch intact in extremities.  Symmetrical.  Speech is fluent. Motor exam as listed above. Psychiatric: Judgment intact, Mood & affect appropriate for pt's clinical situation.       ASSESSMENT AND PLAN:  1. Hematoma of arm, left, subsequent encounter Hematoma that was evacuated from her axillary region in the left arm appears to be doing well.  Every other staple removed without issue.  The patient had a wound VAC placed to the area where her graft was removed.  However, the wound VAC was not placed properly and blood was pooling under the wound.  This placed her great risk for infection so it was removed  today.  The nursing facility was sent instructions to replace the wound VAC and continue with every other day dressing changes as previously prescribed.  There were also sent instructions of the wound VAC was placed improperly.  We placed a wet-to-dry dressing on the wound today prior to sending the patient back to her facility.  When the patient returns in 2 weeks we will remove the rest of her staples as well as the sutures.  2. Complication of  arteriovenous dialysis fistula, subsequent encounter Currently the patient does have access via PermCath.  To her knowledge the patient has not had any access in the right.  However given her central vein stenosis of the left it would be in the patient's best option to undergo a right upper extremity venogram in order to determine the best possible placement for next access.  In the event that she does not have adequate access for a right upper extremity fistula/graft, she could possibly be a candidate for a left thigh graft but that may also be difficult given the patient's body habitus.  We will plan on doing a venogram once the patient has healed from her previous surgical incisions.   Current Outpatient Medications on File Prior to Visit  Medication Sig Dispense Refill  . amLODipine (NORVASC) 10 MG tablet Take 10 mg by mouth daily.    Marland Kitchen aspirin EC 81 MG tablet Take 81 mg by mouth daily.    Marland Kitchen atorvastatin (LIPITOR) 20 MG tablet Take 20 mg by mouth daily.    . cholecalciferol (VITAMIN D) 400 units TABS tablet Take 400 Units by mouth daily.    . citalopram (CELEXA) 20 MG tablet Take 20 mg by mouth daily.    . clopidogrel (PLAVIX) 75 MG tablet Take 75 mg by mouth daily.    Marland Kitchen CREON 12000 units CPEP capsule Take 3 Units by mouth 3 (three) times daily.     . cyanocobalamin 1000 MCG tablet Take 1,000 mcg by mouth daily.     . diclofenac sodium (VOLTAREN) 1 % GEL Apply 1 application topically daily as needed (for rash).    . fluticasone (FLONASE) 50 MCG/ACT nasal spray Place 2 sprays into both nostrils daily.    Marland Kitchen gabapentin (NEURONTIN) 100 MG capsule Take 100-200 mg by mouth 3 (three) times daily. Take one capsule (100 mg) twice daily at 0800 and 1430 and two capsules (200 mg) at bedtime    . hydrALAZINE (APRESOLINE) 10 MG tablet Take 10 mg by mouth 3 (three) times daily.    . hydrocortisone cream 0.5 % Apply topically 3 (three) times daily. 30 g 0  . meclizine (ANTIVERT) 25 MG tablet Take 12.5 mg by  mouth 2 (two) times daily.     . multivitamin (RENA-VIT) TABS tablet Take 1 tablet by mouth daily.     . mupirocin ointment (BACTROBAN) 2 % Apply 1 application topically 3 (three) times daily.    . pantoprazole (PROTONIX) 40 MG tablet Take 40 mg by mouth daily.    . promethazine (PHENERGAN) 12.5 MG tablet Take 1 tablet (12.5 mg total) by mouth every 8 (eight) hours as needed for nausea. 30 tablet 0  . sevelamer carbonate (RENVELA) 800 MG tablet Take 800-2,400 mg by mouth See admin instructions. Take 2400 mg by mouth 3 times daily with meals and take 800 mg by mouth with snacks    . albuterol (PROVENTIL) (2.5 MG/3ML) 0.083% nebulizer solution Take 2.5 mg by nebulization every 4 (four) hours as needed for wheezing or  shortness of breath.    Marland Kitchen albuterol (VENTOLIN HFA) 108 (90 Base) MCG/ACT inhaler Inhale 2 puffs into the lungs every 4 (four) hours as needed for wheezing or shortness of breath.    . carvedilol (COREG) 6.25 MG tablet Take 6.25 mg by mouth 2 (two) times daily.    Marland Kitchen HYDROcodone-acetaminophen (NORCO/VICODIN) 5-325 MG tablet Take 1 tablet by mouth 4 (four) times daily as needed for pain.    . nitroGLYCERIN (NITROSTAT) 0.4 MG SL tablet Place 0.4 mg under the tongue every 5 (five) minutes as needed for chest pain.      No current facility-administered medications on file prior to visit.    There are no Patient Instructions on file for this visit. No follow-ups on file.   Kris Hartmann, NP  This note was completed with Sales executive.  Any errors are purely unintentional.

## 2019-12-17 IMAGING — MR MR ABDOMEN W/O CM
10 series · 48 of 48 positions shown · non-contrast
Comparison: 12/07/2018 CT abdomen without and with IV contrast.
07/07/2019 renal sonogram.

CLINICAL DATA: History of enhancing right renal masses, presenting
for follow-up. Stage renal disease on dialysis.

EXAM:
MRI ABDOMEN WITHOUT CONTRAST
TECHNIQUE: Multiplanar multisequence MR imaging was performed without the
administration of intravenous contrast.

[Series 2: cor haste · coronal · 6.0mm · 1.19mm/px · 3 of 34 slices shown]
[im 1/34]
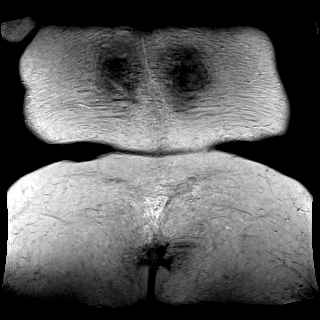
[im 17/34]
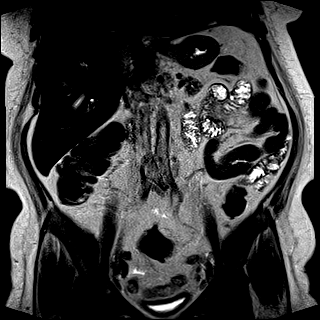
[im 34/34]
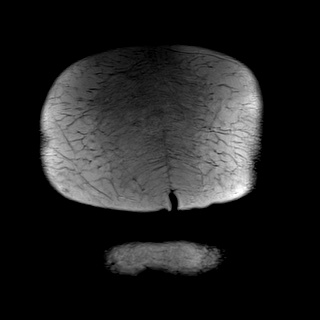

[Series 4: T2 fat-sat · axial · 6.0mm · 1.19mm/px · z∈[-108,+115]mm · 3 of 32 slices shown]
[im 1/32]
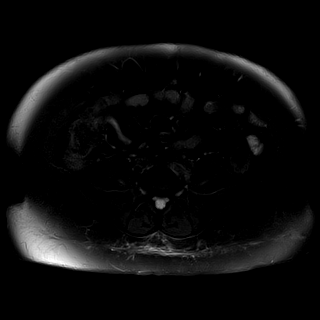
[im 16/32]
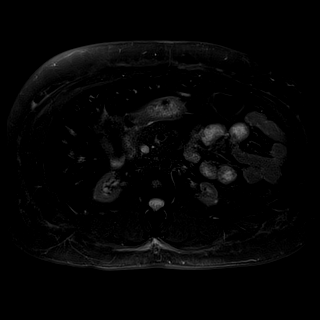
[im 32/32]
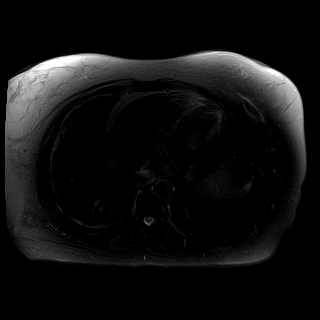

[Series 6: DWI · axial · 6.0mm · 1.42mm/px · z∈[-108,+115]mm · 4 of 32 slices shown (1 of 4)]
[im 1/32]
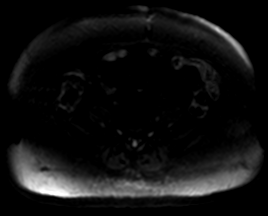
[im 11/32]
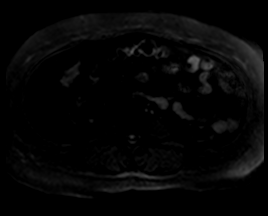
[im 21/32]
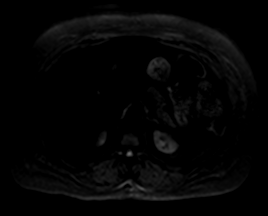
[im 32/32]
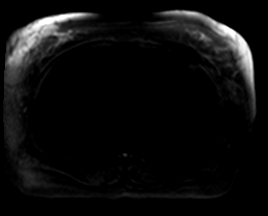

[Series 6: DWI · axial · 6.0mm · 1.42mm/px · z∈[-108,+115]mm · 4 of 32 slices shown (2 of 4)]
[im 1/32]
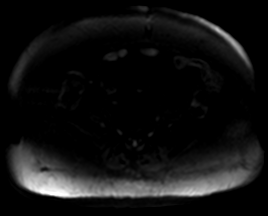
[im 11/32]
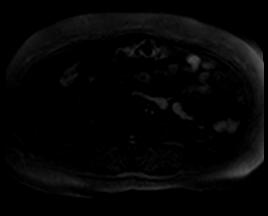
[im 21/32]
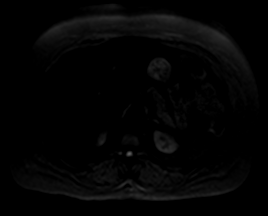
[im 32/32]
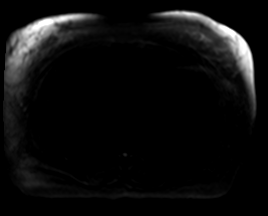

[Series 6: DWI · axial · 6.0mm · 1.42mm/px · z∈[-108,+115]mm · 4 of 32 slices shown (3 of 4)]
[im 1/32]
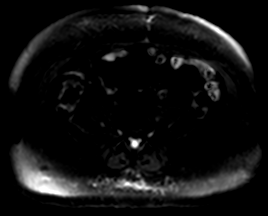
[im 11/32]
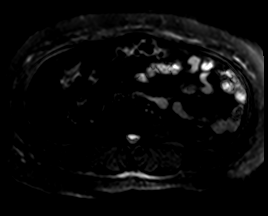
[im 21/32]
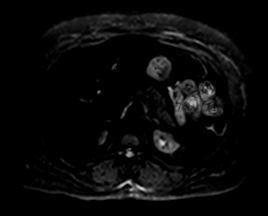
[im 32/32]
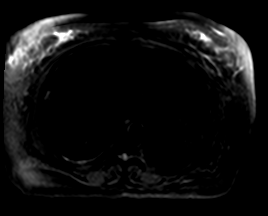

[Series 7: DWI · axial · 6.0mm · 1.42mm/px · z∈[-108,+115]mm · 4 of 32 slices shown (4 of 4)]
[im 1/32]
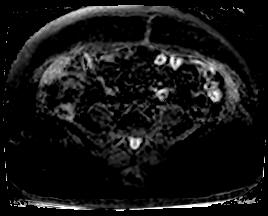
[im 11/32]
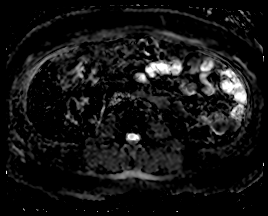
[im 21/32]
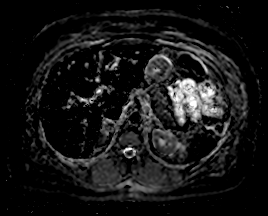
[im 32/32]
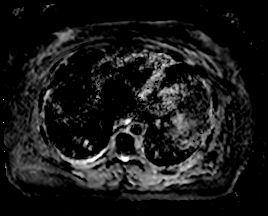

[Series 8: ax in & · axial · 3.0mm · 1.19mm/px · z∈[-103,+110]mm · 9 of 72 slices shown (1 of 2)]
[im 1/72]
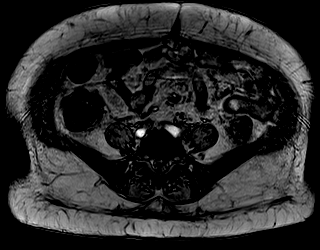
[im 9/72]
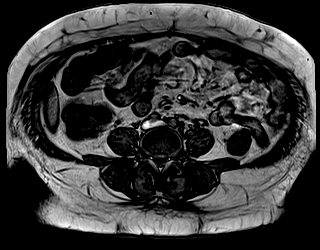
[im 18/72]
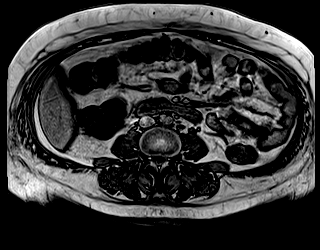
[im 27/72]
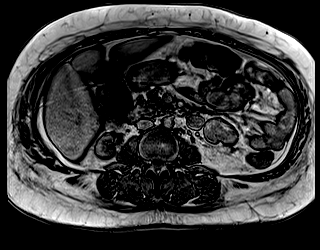
[im 36/72]
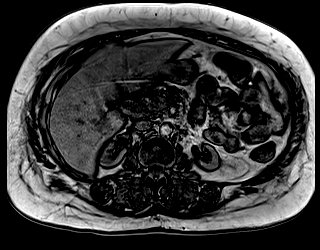
[im 45/72]
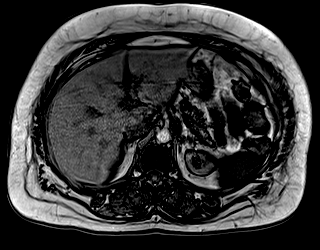
[im 54/72]
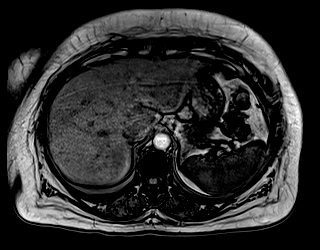
[im 63/72]
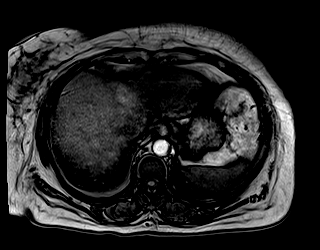
[im 72/72]
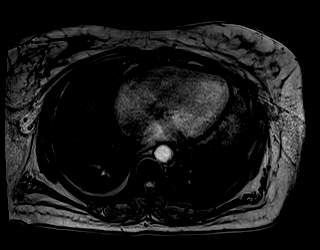

[Series 8: ax in & · axial · 3.0mm · 1.19mm/px · z∈[-103,+110]mm · 9 of 72 slices shown (2 of 2)]
[im 1/72]
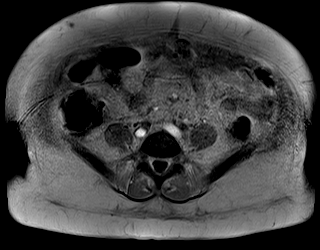
[im 9/72]
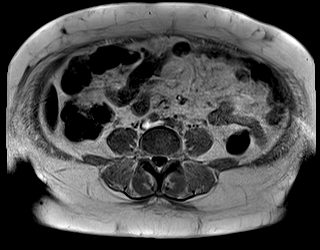
[im 18/72]
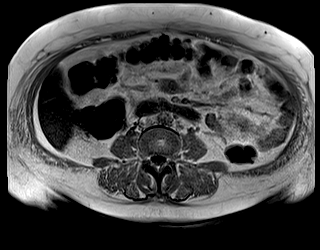
[im 27/72]
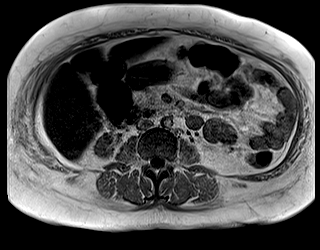
[im 36/72]
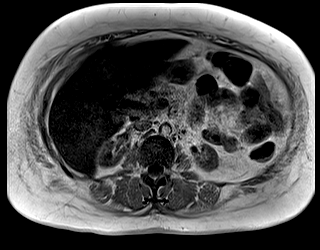
[im 45/72]
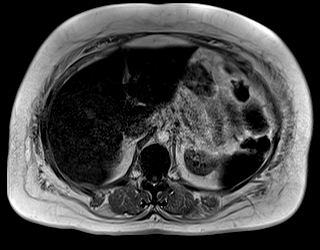
[im 54/72]
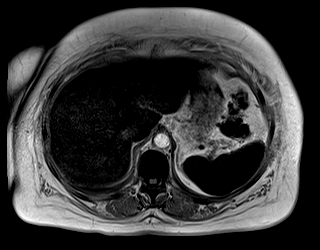
[im 63/72]
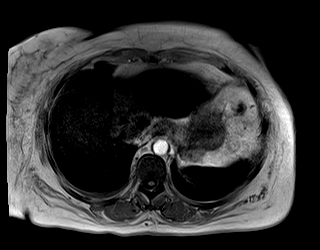
[im 72/72]
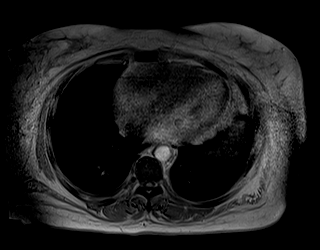

[Series 9: bSSFP · axial · 6.0mm · 0.74mm/px · z∈[-108,+115]mm · 4 of 32 slices shown]
[im 1/32]
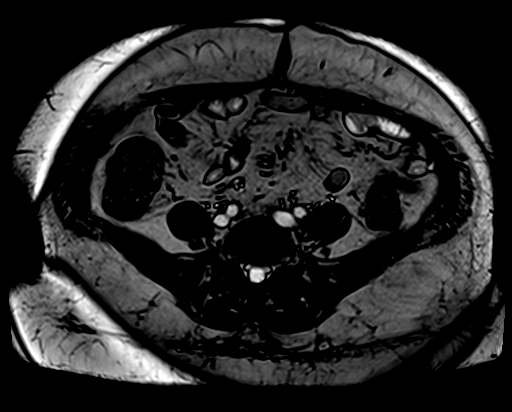
[im 11/32]
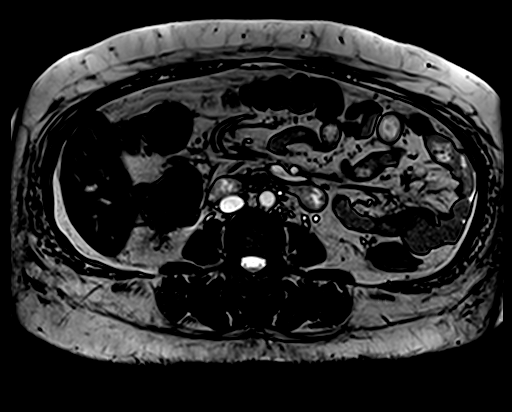
[im 21/32]
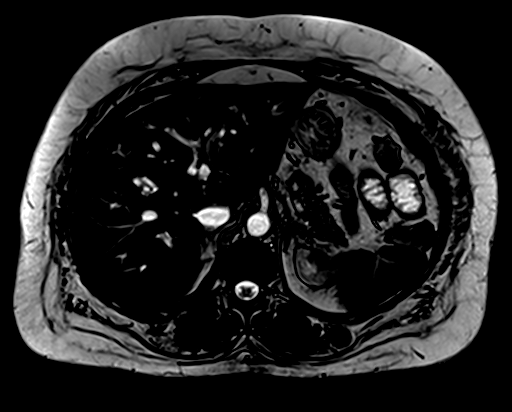
[im 32/32]
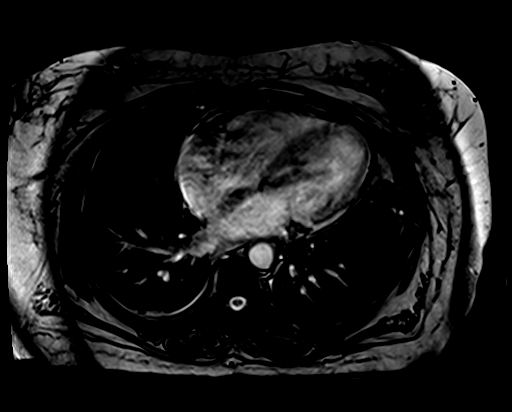

[Series 10: T2 · axial · 6.0mm · 1.19mm/px · z∈[-108,+115]mm · 4 of 32 slices shown]
[im 1/32]
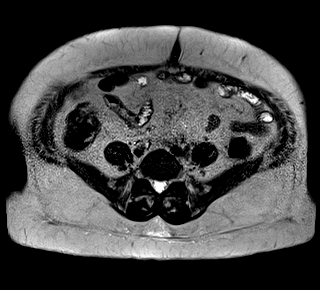
[im 11/32]
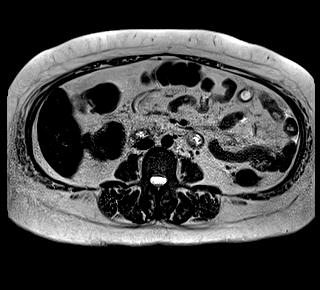
[im 21/32]
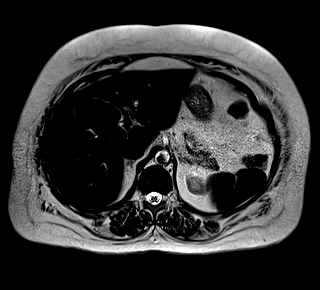
[im 32/32]
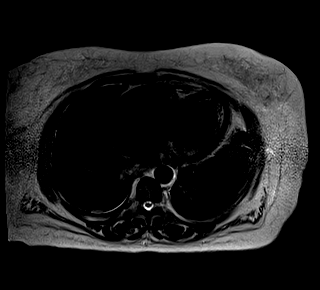

[48 of 48 positions shown; findings below may reference images not displayed]

FINDINGS: Lower chest: Trace dependent right pleural effusion.

Hepatobiliary: Normal liver size and configuration. Diffuse liver
hemosiderosis. No liver mass. Cholecystectomy. Bile ducts are stable
and within normal post cholecystectomy limits. Common bile duct
diameter 8 mm. No evidence of choledocholithiasis. No biliary
strictures or beading.

Pancreas: No pancreatic mass or duct dilation.  No pancreas divisum.

Spleen: Normal size spleen. Diffuse splenic hemosiderosis. No
splenic mass.

Adrenals/Urinary Tract: Normal adrenals. No hydronephrosis.
Symmetric severe bilateral renal atrophy, unchanged. Medial upper
right kidney 1.5 x 1.3 cm cortical mass (series 10/image 15) is
similar in signal intensity to the renal cortex on T1 and T2
sequences, poorly visualized without IV contrast, previously 1.5 x
1.3 cm on 12/07/2018 CT, not appreciably changed in size. Posterior
upper right kidney 1.3 x 1.2 cm cortical mass (series 10/image 16)
is similar in signal intensity to the renal cortex on T1 and T2
sequences, poorly visualized without IV contrast, previously 1.3 x
1.2 cm on 12/07/2018 CT, not appreciably changed in size. Previously
visualized 0.6 cm enhancing renal cortical lesion in the anterior
upper right kidney is occult on today's noncontrast MRI study.
Subcentimeter renal cortical cysts scattered in both kidneys. No new
suspicious renal masses on this noncontrast study.

Stomach/Bowel: Normal non-distended stomach. Visualized small and
large bowel is normal caliber, with no bowel wall thickening.
Colonic diverticulosis.

Vascular/Lymphatic: Normal caliber abdominal aorta. No
pathologically enlarged lymph nodes in the abdomen.

Other: No abdominal ascites or focal fluid collection.

Musculoskeletal: No aggressive appearing focal osseous lesions.
IMPRESSION: 1. Known enhancing renal cortical masses in the medial upper right
kidney and posterior upper right kidney are stable in size since
12/07/2018 CT. Separate known enhancing subcentimeter renal cortical
mass in the anterior upper right kidney is occult on today's study.
No appreciable new suspicious renal masses.
2. No adenopathy or other findings of metastatic disease in the
abdomen on this noncontrast study.
3. Trace dependent right pleural effusion.
4. Diffuse hemosiderosis in the liver and spleen, presumably related
to prior blood transfusions.
5. Colonic diverticulosis.

## 2019-12-22 ENCOUNTER — Ambulatory Visit (INDEPENDENT_AMBULATORY_CARE_PROVIDER_SITE_OTHER): Payer: Medicare Other | Admitting: Vascular Surgery

## 2019-12-29 ENCOUNTER — Encounter (INDEPENDENT_AMBULATORY_CARE_PROVIDER_SITE_OTHER): Payer: Self-pay | Admitting: Vascular Surgery

## 2019-12-29 ENCOUNTER — Other Ambulatory Visit: Payer: Self-pay

## 2019-12-29 ENCOUNTER — Telehealth (INDEPENDENT_AMBULATORY_CARE_PROVIDER_SITE_OTHER): Payer: Self-pay

## 2019-12-29 ENCOUNTER — Ambulatory Visit (INDEPENDENT_AMBULATORY_CARE_PROVIDER_SITE_OTHER): Payer: Medicare Other | Admitting: Vascular Surgery

## 2019-12-29 VITALS — BP 156/68 | HR 62 | Resp 16 | Ht 65.0 in | Wt 176.0 lb

## 2019-12-29 DIAGNOSIS — T829XXD Unspecified complication of cardiac and vascular prosthetic device, implant and graft, subsequent encounter: Secondary | ICD-10-CM

## 2019-12-29 NOTE — Telephone Encounter (Signed)
Patient was seen today and is now scheduled with Dr. Delana Meyer for a right arm venogram on 01/10/20 with a 9:00 am arrival time to the [facility]. Patient will do covid testing on 01/06/20 between 8-1 pm at the [place]. Spoke with Celeste at [facility] and the pre-procedure instructions will be faxed to [facility] for the patient.

## 2019-12-29 NOTE — Progress Notes (Signed)
Patient ID: [name], female   DOB: 04-25-1961, 59 y.o.   MRN: [id number]  Chief Complaint  Patient presents with  . Follow-up    2wks staple removal     HPI [name] is a 59 y.o. female.    [name] has done well status post excision of her infected left arm graft.  Her left arm pain has resolved.  She is no longer using the VAC as the wound is healed.  She has been struggling with worsening of her lymphedema and did sustain a superficial scrape of the right shin.  She has been treating this with elevation and antibiotics.   Past Medical History:  Diagnosis Date  . Anemia   . Anginal pain ([place])   . Anxiety   . Arthritis   . Asthma   . Broken wrist   . Bronchitis   . chronic diastolic CHF 3/35/4562  . Chronic kidney disease   . COPD (chronic obstructive pulmonary disease) ([place])   . Coronary artery disease    a. cath 2013: stenting to RCA (report not available); b. cath 2014: LM nl, pLAD 40%, mLAD nl, ost LCx 40%, mid LCx nl, pRCA 30% @ site of prior stent, mRCA 50%  . Depression   . Diabetes mellitus ([place])   . Diabetes mellitus without complication ([place])   . Diabetic neuropathy ([place])   . dialysis 2006  . Diverticulosis   . Dizziness   . Dyspnea   . Elevated lipids   . Environmental and seasonal allergies   . ESRD (end stage renal disease) on dialysis ([place])    M-W-F  . Gastroparesis   . GERD (gastroesophageal reflux disease)   . Headache   . History of anemia due to chronic kidney disease   . History of hiatal hernia   . HOH (hard of hearing)   . Hx of pancreatitis 2015  . Hypertension   . Lower extremity edema   . Mitral regurgitation    a. echo 10/2013: EF 62%, noWMA, mildly dilated LA, mild to mod MR/TR, GR1DD  . Myocardial infarction ([place])   . Orthopnea   . Parathyroid abnormality ([place])   . Peripheral arterial disease ([place])   . Pneumonia   . Renal cancer ([place])   . Renal insufficiency    Pt is on dialysis on M,W + F.  . Wheezing     Past  Surgical History:  Procedure Laterality Date  . A/V SHUNTOGRAM Left 01/20/2018   Procedure: A/V SHUNTOGRAM;  Surgeon: Algernon Huxley, MD;  Location: [facility];  Service: Cardiovascular;  Laterality: Left;  . ABDOMINAL HYSTERECTOMY  1992  . AMPUTATION TOE Left 10/02/2017   Procedure: AMPUTATION TOE-LEFT GREAT TOE;  Surgeon: Albertine Patricia, DPM;  Location: [facility];  Service: Podiatry;  Laterality: Left;  . APPENDECTOMY    . APPLICATION OF WOUND VAC N/A 11/25/2019   Procedure: APPLICATION OF WOUND VAC;  Surgeon: Katha Cabal, MD;  Location: [facility];  Service: Vascular;  Laterality: N/A;  . ARTERY BIOPSY Right 10/11/2018   Procedure: BIOPSY TEMPORAL ARTERY;  Surgeon: Vickie Epley, MD;  Location: [facility];  Service: General;  Laterality: Right;  . CARDIAC CATHETERIZATION Left 07/26/2015   Procedure: Left Heart Cath and Coronary Angiography;  Surgeon: Dionisio David, MD;  Location: [facility];  Service: Cardiovascular;  Laterality: Left;  . CATARACT EXTRACTION W/ INTRAOCULAR LENS IMPLANT Right   . CATARACT EXTRACTION W/PHACO Left 03/10/2017  Procedure: CATARACT EXTRACTION PHACO AND INTRAOCULAR LENS PLACEMENT (IOC);  Surgeon: Birder Robson, MD;  Location: [facility];  Service: Ophthalmology;  Laterality: Left;  [place] 00:51.9 AP% 14.2 CDE 7.39 fluid pack lot # [id number]  . CENTRAL LINE INSERTION Right 11/11/2019   Procedure: CENTRAL LINE INSERTION;  Surgeon: Katha Cabal, MD;  Location: [facility];  Service: Vascular;  Laterality: Right;  . CENTRAL LINE INSERTION  11/25/2019   Procedure: CENTRAL LINE INSERTION;  Surgeon: Katha Cabal, MD;  Location: [facility];  Service: Vascular;;  . CHOLECYSTECTOMY    . COLONOSCOPY WITH PROPOFOL N/A 08/12/2016   Procedure: COLONOSCOPY WITH PROPOFOL;  Surgeon: Lollie Sails, MD;  Location: [facility];  Service: Endoscopy;  Laterality: N/A;  . DIALYSIS FISTULA CREATION Left    upper arm  . dialysis grafts    .  DIALYSIS/PERMA CATHETER INSERTION N/A 11/14/2019   Procedure: DIALYSIS/PERMA CATHETER INSERTION;  Surgeon: Algernon Huxley, MD;  Location: [facility];  Service: Cardiovascular;  Laterality: N/A;  . ESOPHAGOGASTRODUODENOSCOPY N/A 03/08/2015   Procedure: ESOPHAGOGASTRODUODENOSCOPY (EGD);  Surgeon: Manya Silvas, MD;  Location: [facility] ENDOSCOPY;  Service: Endoscopy;  Laterality: N/A;  . ESOPHAGOGASTRODUODENOSCOPY (EGD) WITH PROPOFOL N/A 03/18/2016   Procedure: ESOPHAGOGASTRODUODENOSCOPY (EGD) WITH PROPOFOL;  Surgeon: [name], MD;  Location: [facility]OSCOPY;  Service: Endoscopy;  Laterality: N/A;  . EYE SURGERY Right 2018  . FECAL TRANSPLANT N/A 08/23/2015   Procedure: FECAL TRANSPLANT;  Surgeon: Manya Silvas, MD;  Location: [facility];  Service: Endoscopy;  Laterality: N/A;  . HAND SURGERY Bilateral   . HEMATOMA EVACUATION Left 11/25/2019   Procedure: EVACUATION HEMATOMA;  Surgeon: Katha Cabal, MD;  Location: [facility];  Service: Vascular;  Laterality: Left;  . I & D EXTREMITY Left 11/25/2019   Procedure: IRRIGATION AND DEBRIDEMENT EXTREMITY;  Surgeon: Katha Cabal, MD;  Location: [facility];  Service: Vascular;  Laterality: Left;  . IR RADIOLOGIST EVAL & MGMT  07/28/2019  . IR RADIOLOGIST EVAL & MGMT  08/11/2019  . LIGATION OF ARTERIOVENOUS  FISTULA Left 11/11/2019  . LIGATION OF ARTERIOVENOUS  FISTULA Left 11/11/2019   Procedure: LIGATION OF ARTERIOVENOUS  FISTULA;  Surgeon: Katha Cabal, MD;  Location: [facility];  Service: Vascular;  Laterality: Left;  . PERIPHERAL VASCULAR CATHETERIZATION N/A 12/20/2015   Procedure: Thrombectomy of dialysis access versus permcath placement;  Surgeon: Algernon Huxley, MD;  Location: [facility];  Service: Cardiovascular;  Laterality: N/A;  . PERIPHERAL VASCULAR CATHETERIZATION N/A 12/20/2015   Procedure: A/V Shunt Intervention;  Surgeon: Algernon Huxley, MD;  Location: [facility];  Service: Cardiovascular;  Laterality: N/A;  .  PERIPHERAL VASCULAR CATHETERIZATION N/A 12/20/2015   Procedure: A/V Shuntogram/Fistulagram;  Surgeon: Algernon Huxley, MD;  Location: [facility];  Service: Cardiovascular;  Laterality: N/A;  . PERIPHERAL VASCULAR CATHETERIZATION N/A 01/02/2016   Procedure: A/V Shuntogram/Fistulagram;  Surgeon: Algernon Huxley, MD;  Location: [facility];  Service: Cardiovascular;  Laterality: N/A;  . PERIPHERAL VASCULAR CATHETERIZATION N/A 01/02/2016   Procedure: A/V Shunt Intervention;  Surgeon: Algernon Huxley, MD;  Location: [facility];  Service: Cardiovascular;  Laterality: N/A;      Allergies  Allergen Reactions  . Ace Inhibitors Swelling and Anaphylaxis  . Ativan [Lorazepam] Other (See Comments)    Reaction:Hallucinations and headaches  . Compazine [Prochlorperazine Edisylate] Anaphylaxis, Nausea And Vomiting and Other (See Comments)    Other reaction(s): dystonia from this vs. Reglan, 23 Jul -  patient relates that she takes promethazine frequently with no problems  . Sumatriptan Succinate Other (See Comments)    Other reaction(s): delirium and hallucinations per [facility] records  . Dilaudid [Hydromorphone Hcl] Other (See Comments)    Delirium   . Ondansetron Other (See Comments)    hallucinations    . Zofran [Ondansetron Hcl] Other (See Comments)    Reaction:  hallucinations   . Dilaudid [Hydromorphone]   . Zofran [Ondansetron]     Per pt. she is allergic to zofran or will experience adverse reaction like hallucination   . Codeine Nausea And Vomiting  . Gabapentin Other (See Comments)    Reaction:  Unknown    . [place] Nausea And Vomiting  . Losartan Nausea Only  . Oxycodone Anxiety  . Prochlorperazine Other (See Comments)    Reaction:  Unknown . Patient does not remember reaction but she does have vertigo and anxiety along with n and v at times. Could be used to treat any of these   . Reglan [Metoclopramide] Other (See Comments)    Per patient her Dr. Evelina Bucy her off it     . Scopolamine Other (See Comments)    Dizziness, also has vertigo already  . Tape Rash  . Tapentadol Rash    Current Outpatient Medications  Medication Sig Dispense Refill  . albuterol (PROVENTIL) (2.5 MG/3ML) 0.083% nebulizer solution Take 2.5 mg by nebulization every 4 (four) hours as needed for wheezing or shortness of breath.    Marland Kitchen albuterol (VENTOLIN HFA) 108 (90 Base) MCG/ACT inhaler Inhale 2 puffs into the lungs every 4 (four) hours as needed for wheezing or shortness of breath.    Marland Kitchen amLODipine (NORVASC) 10 MG tablet Take 10 mg by mouth daily.    Marland Kitchen aspirin EC 81 MG tablet Take 81 mg by mouth daily.    Marland Kitchen atorvastatin (LIPITOR) 20 MG tablet Take 20 mg by mouth daily.    . carvedilol (COREG) 6.25 MG tablet Take 6.25 mg by mouth 2 (two) times daily.    . cholecalciferol (VITAMIN D) 400 units TABS tablet Take 400 Units by mouth daily.    . citalopram (CELEXA) 20 MG tablet Take 20 mg by mouth daily.    . clopidogrel (PLAVIX) 75 MG tablet Take 75 mg by mouth daily.    Marland Kitchen CREON 12000 units CPEP capsule Take 3 Units by mouth 3 (three) times daily.     . cyanocobalamin 1000 MCG tablet Take 1,000 mcg by mouth daily.     . diclofenac sodium (VOLTAREN) 1 % GEL Apply 1 application topically daily as needed (for rash).    . fluticasone (FLONASE) 50 MCG/ACT nasal spray Place 2 sprays into both nostrils daily.    Marland Kitchen gabapentin (NEURONTIN) 100 MG capsule Take 100-200 mg by mouth 3 (three) times daily. Take one capsule (100 mg) twice daily at 0800 and 1430 and two capsules (200 mg) at bedtime    . hydrALAZINE (APRESOLINE) 10 MG tablet Take 10 mg by mouth 3 (three) times daily.    Marland Kitchen HYDROcodone-acetaminophen (NORCO/VICODIN) 5-325 MG tablet Take 1 tablet by mouth 4 (four) times daily as needed for pain.    . hydrocortisone cream 0.5 % Apply topically 3 (three) times daily. 30 g 0  . meclizine (ANTIVERT) 25 MG tablet Take 12.5 mg by mouth 2 (two) times daily.     . multivitamin (RENA-VIT) TABS tablet Take  1 tablet by mouth daily.     . mupirocin ointment (BACTROBAN) 2 % Apply 1  application topically 3 (three) times daily.    . nitroGLYCERIN (NITROSTAT) 0.4 MG SL tablet Place 0.4 mg under the tongue every 5 (five) minutes as needed for chest pain.     . pantoprazole (PROTONIX) 40 MG tablet Take 40 mg by mouth daily.    . promethazine (PHENERGAN) 12.5 MG tablet Take 1 tablet (12.5 mg total) by mouth every 8 (eight) hours as needed for nausea. 30 tablet 0  . sevelamer carbonate (RENVELA) 800 MG tablet Take 800-2,400 mg by mouth See admin instructions. Take 2400 mg by mouth 3 times daily with meals and take 800 mg by mouth with snacks     No current facility-administered medications for this visit.        Physical Exam BP (!) 156/68 (BP Location: Right Arm)   Pulse 62   Resp 16   Ht 5\' 5"  (1.651 m)   Wt 176 lb (79.8 kg)   BMI 29.29 kg/m  Gen:  WD/WN, NAD Skin: incision C/D it is now completely healed.  The catheter is clean dry and intact right EJ.  Old right arm AV graft palpable.     Assessment/Plan: 1. Complication of arteriovenous dialysis fistula, subsequent encounter Recommend:  The patient is s/p successful excision of the left arm AV graft and this is now healed.  Patient should have a central venogram of the right arm with the intention for intervention.  The intention for intervention is to suitable venous outflow for a right arm AV graft.  As well as improve the quality of dialysis therapy.  The risks, benefits and alternative therapies were reviewed in detail with the patient.  All questions were answered.  The patient agrees to proceed with angio/intervention.          [name] 12/29/2019, 11:16 AM   This note was created with Dragon medical transcription system.  Any errors from dictation are unintentional.

## 2019-12-30 IMAGING — CT CT ABD-PELV W/O CM
2 of 4 series · 16 of 46 positions shown, 18 images · non-contrast
Comparison: CT abdomen and pelvis 10/02/2018.

CLINICAL DATA: Onset severe right upper quadrant pain today.
History of renal failure.

EXAM:
CT ABDOMEN AND PELVIS WITHOUT CONTRAST
TECHNIQUE: Multidetector CT imaging of the abdomen and pelvis was performed
following the standard protocol without IV contrast.

[Series 2: routine abd/pel wo · axial · 0.75mm/px · z∈[-1023,-638]mm · 13 of 85 slices shown, 15 images]
[im 4/85  soft-tissue]
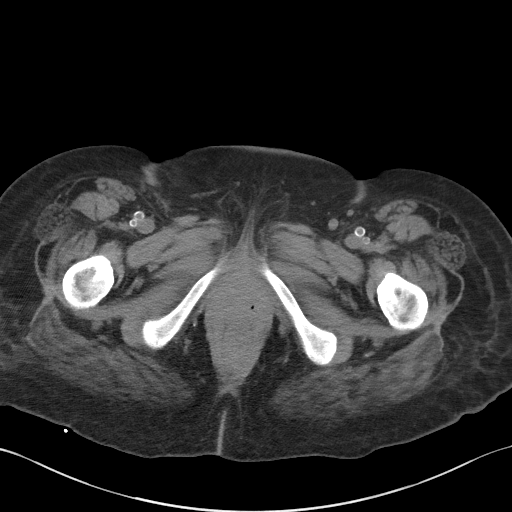
[im 4/85  bone]
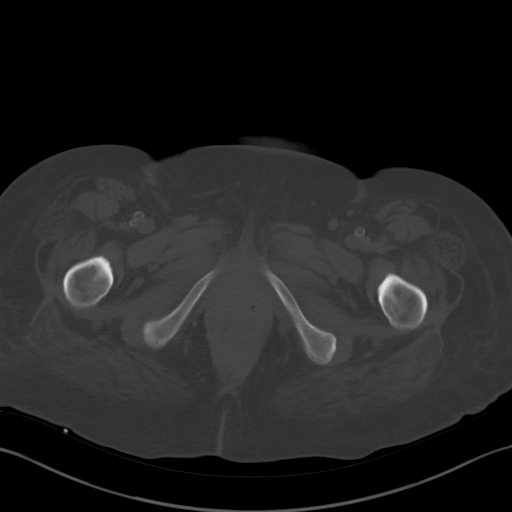
[im 11/85  soft-tissue]
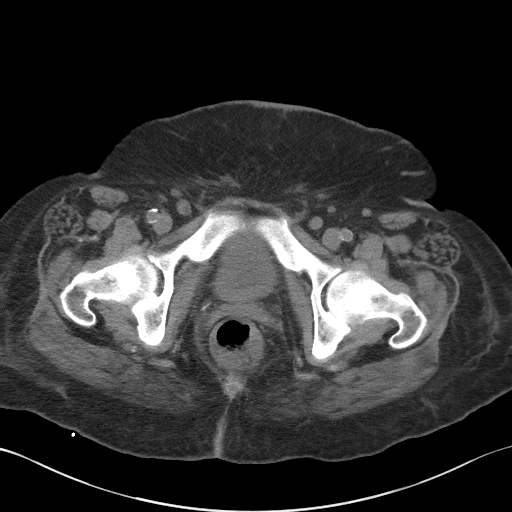
[im 17/85  soft-tissue]
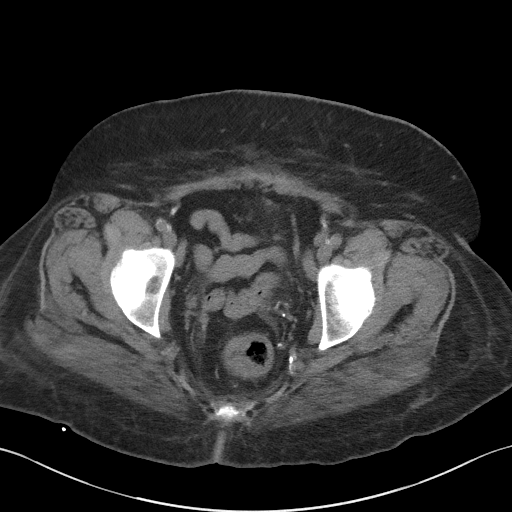
[im 24/85  soft-tissue]
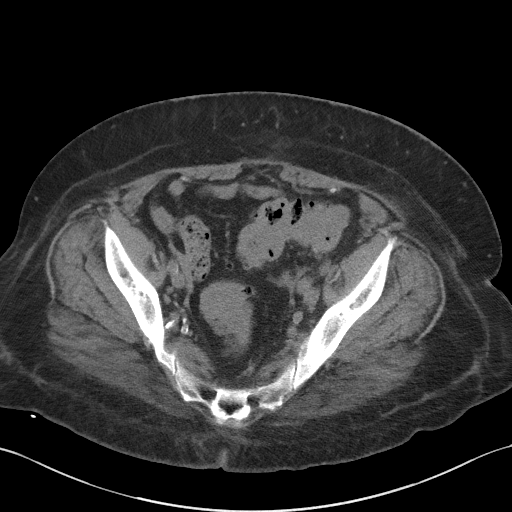
[im 31/85  soft-tissue]
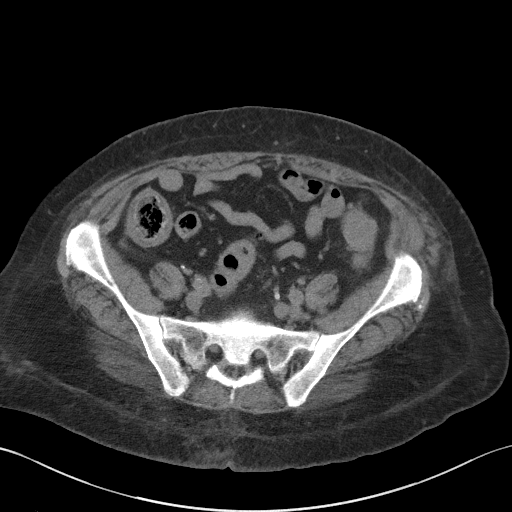
[im 37/85  soft-tissue]
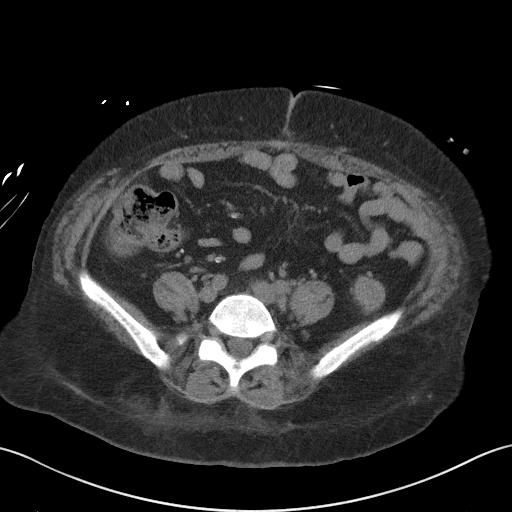
[im 44/85  soft-tissue]
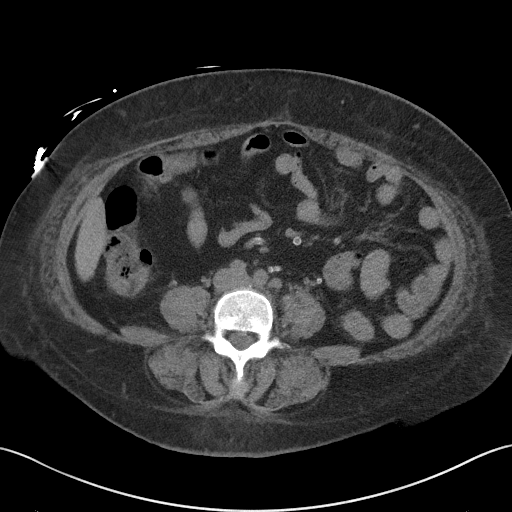
[im 48/85  soft-tissue]
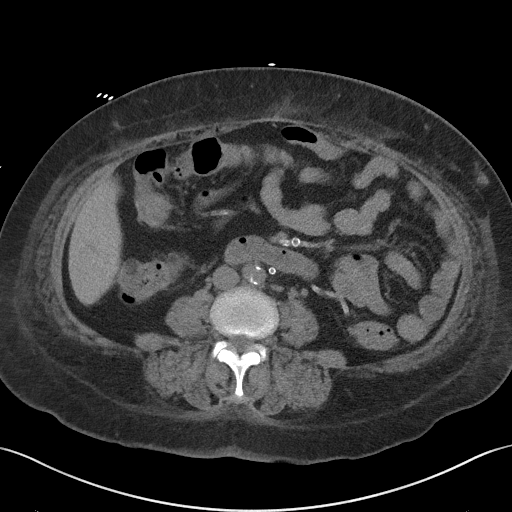
[im 54/85  soft-tissue]
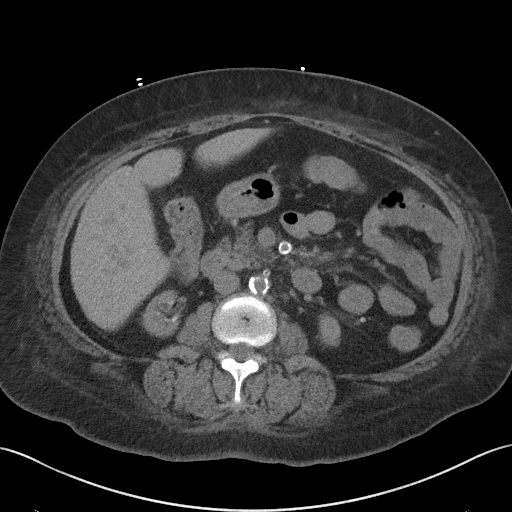
[im 54/85  bone]
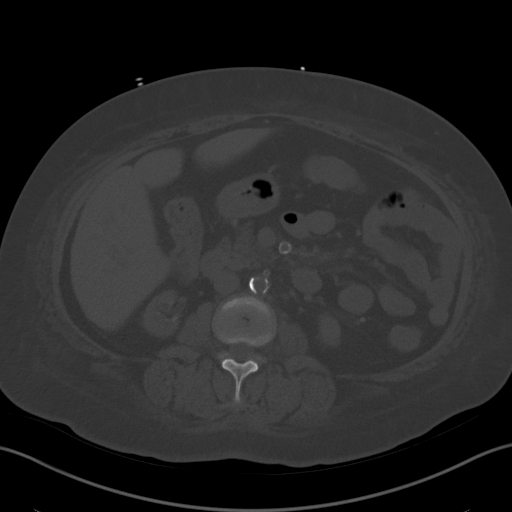
[im 61/85  soft-tissue]
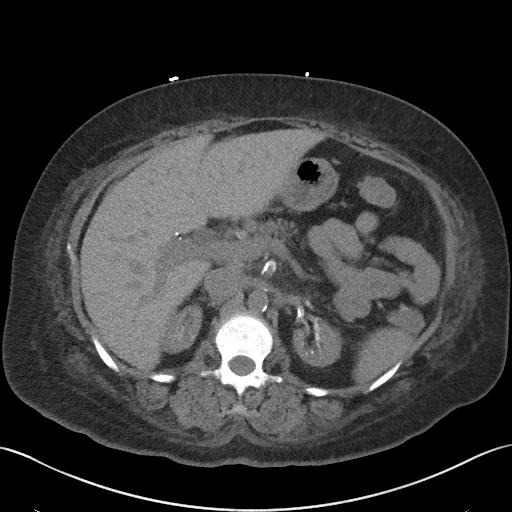
[im 68/85  soft-tissue]
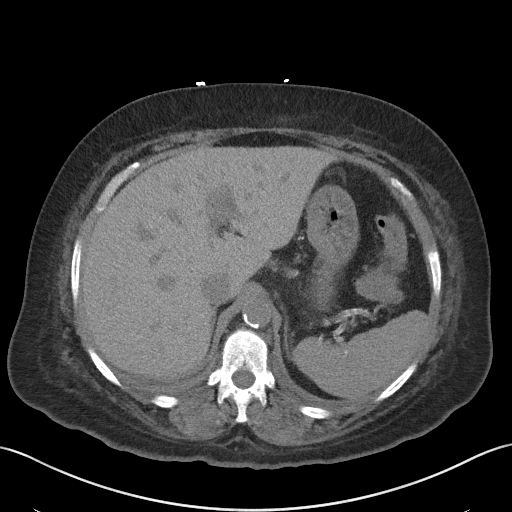
[im 74/85  soft-tissue]
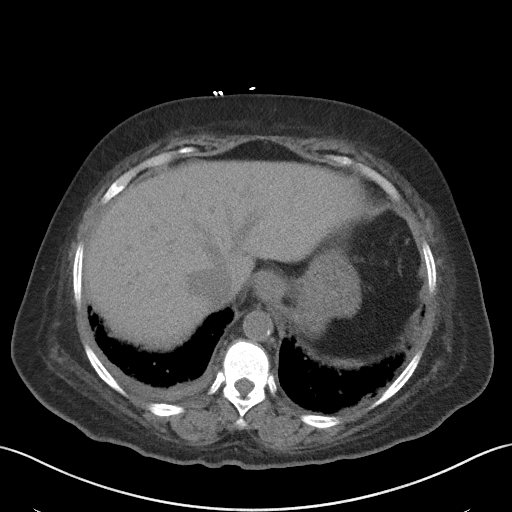
[im 81/85  soft-tissue]
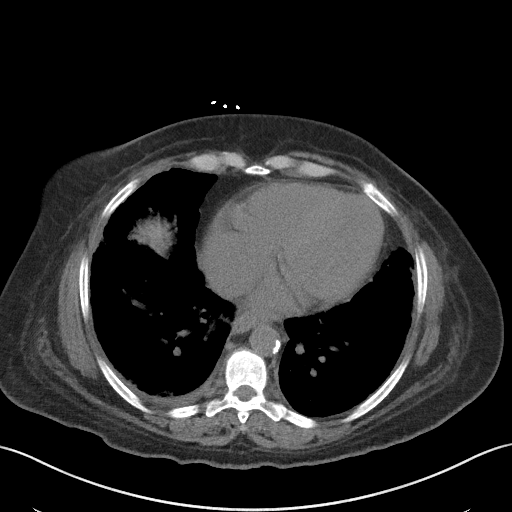

[Series 5: coronal st · coronal · 0.75mm/px · 3 of 103 slices shown]
[im 35/103  soft-tissue]
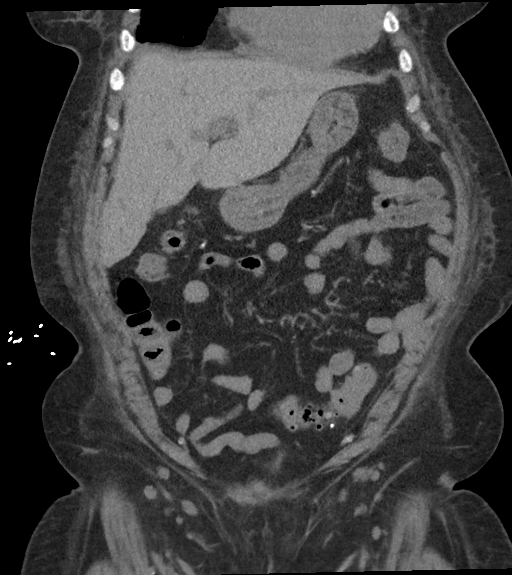
[im 46/103  soft-tissue]
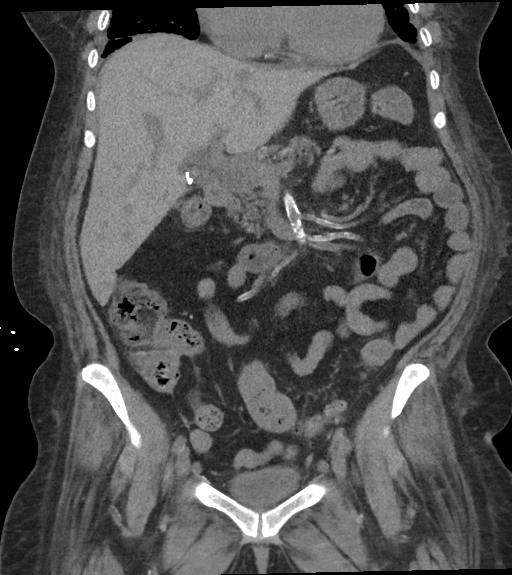
[im 57/103  soft-tissue]
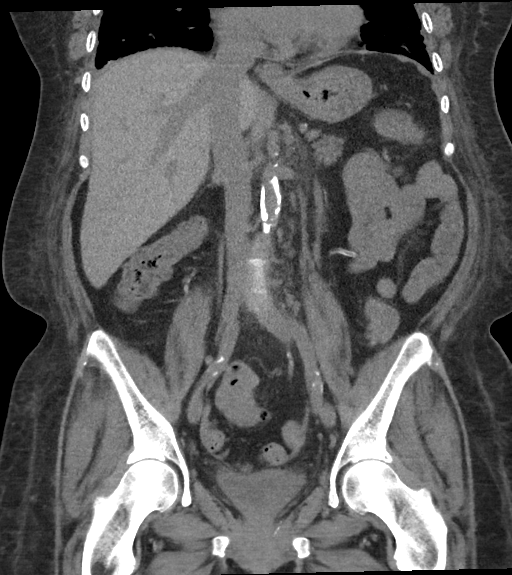

[16 of 46 positions shown; findings below may reference images not displayed]

FINDINGS: Lower chest: Heart size is upper normal. Very small right pleural
effusion is seen and there is some dependent atelectasis present.

Hepatobiliary: No focal liver abnormality is seen. Status post
cholecystectomy. No biliary dilatation.

Pancreas: Unremarkable. No pancreatic ductal dilatation or
surrounding inflammatory changes.

Spleen: Normal in size without focal abnormality.

Adrenals/Urinary Tract: The adrenal glands appear normal. Marked
atrophy of the kidneys consistent with chronic renal failure is
unchanged. Ureters and urinary bladder are unremarkable.

Stomach/Bowel: Stomach is within normal limits. Status post
appendectomy. No evidence of bowel wall thickening, distention, or
inflammatory changes. Diverticulosis without diverticulitis is most
notable in the sigmoid colon.

Vascular/Lymphatic: Aortic atherosclerosis. No enlarged abdominal or
pelvic lymph nodes.

Reproductive: Status post hysterectomy. No adnexal masses.

Other: None.

Musculoskeletal: Increased density in all imaged bones is consistent
with renal osteodystrophy. No acute or focal abnormality.
IMPRESSION: No acute abnormality abdomen or pelvis. No finding to explain the
patient's symptoms.

Small right pleural effusion and basilar atelectasis.

Atherosclerosis.

Diverticulosis without diverticulitis.

## 2020-01-05 ENCOUNTER — Telehealth (INDEPENDENT_AMBULATORY_CARE_PROVIDER_SITE_OTHER): Payer: Self-pay

## 2020-01-05 NOTE — Telephone Encounter (Signed)
Patient's angio will be rescheduled because she is about to change her dialysis days.

## 2020-01-06 ENCOUNTER — Encounter (INDEPENDENT_AMBULATORY_CARE_PROVIDER_SITE_OTHER): Payer: Self-pay

## 2020-01-06 ENCOUNTER — Other Ambulatory Visit: Admission: RE | Admit: 2020-01-06 | Payer: Medicare Other | Source: Ambulatory Visit

## 2020-01-06 NOTE — Telephone Encounter (Signed)
Celeste from [facility] called wanting to let me know the patient will be transferring to T/TH/SAT starting on 01/12/20. Patent has been rescheduled for her venogram with Dr. Delana Meyer to 01/18/20 with a 10:15 am arrival time to the [facility]. Patient will do covid testing on 04/46/21 between 8-1 pm at the [facility]. Pre-procedure instructions will be faxed to [facility] at [facility].

## 2020-01-16 ENCOUNTER — Other Ambulatory Visit
Admission: RE | Admit: 2020-01-16 | Discharge: 2020-01-16 | Disposition: A | Discharge status: Home / Self Care | Payer: Medicare Other | Source: Ambulatory Visit | Attending: Vascular Surgery | Admitting: Vascular Surgery

## 2020-01-16 ENCOUNTER — Other Ambulatory Visit: Payer: Self-pay

## 2020-01-16 DIAGNOSIS — Z01812 Encounter for preprocedural laboratory examination: Secondary | ICD-10-CM | POA: Diagnosis present

## 2020-01-16 DIAGNOSIS — Z20822 Contact with and (suspected) exposure to covid-19: Secondary | ICD-10-CM | POA: Insufficient documentation

## 2020-01-16 LAB — SARS CORONAVIRUS 2 (TAT 6-24 HRS): SARS Coronavirus 2: NEGATIVE

## 2020-01-17 ENCOUNTER — Other Ambulatory Visit (INDEPENDENT_AMBULATORY_CARE_PROVIDER_SITE_OTHER): Payer: Self-pay | Admitting: Nurse Practitioner

## 2020-01-18 ENCOUNTER — Ambulatory Visit
Admission: RE | Admit: 2020-01-18 | Discharge: 2020-01-18 | Disposition: A | Discharge status: Home / Self Care | Payer: Medicare Other | Attending: Vascular Surgery | Admitting: Vascular Surgery

## 2020-01-18 ENCOUNTER — Encounter
Admission: RE | Disposition: A | Discharge status: Home / Self Care | Payer: Self-pay | Source: Home / Self Care | Attending: Vascular Surgery

## 2020-01-18 ENCOUNTER — Other Ambulatory Visit: Payer: Self-pay

## 2020-01-18 DIAGNOSIS — N186 End stage renal disease: Secondary | ICD-10-CM | POA: Insufficient documentation

## 2020-01-18 DIAGNOSIS — E1122 Type 2 diabetes mellitus with diabetic chronic kidney disease: Secondary | ICD-10-CM | POA: Insufficient documentation

## 2020-01-18 DIAGNOSIS — E1143 Type 2 diabetes mellitus with diabetic autonomic (poly)neuropathy: Secondary | ICD-10-CM | POA: Insufficient documentation

## 2020-01-18 DIAGNOSIS — Y841 Kidney dialysis as the cause of abnormal reaction of the patient, or of later complication, without mention of misadventure at the time of the procedure: Secondary | ICD-10-CM | POA: Diagnosis not present

## 2020-01-18 DIAGNOSIS — I5032 Chronic diastolic (congestive) heart failure: Secondary | ICD-10-CM | POA: Diagnosis not present

## 2020-01-18 DIAGNOSIS — F329 Major depressive disorder, single episode, unspecified: Secondary | ICD-10-CM | POA: Diagnosis not present

## 2020-01-18 DIAGNOSIS — Z79899 Other long term (current) drug therapy: Secondary | ICD-10-CM | POA: Insufficient documentation

## 2020-01-18 DIAGNOSIS — Z7902 Long term (current) use of antithrombotics/antiplatelets: Secondary | ICD-10-CM | POA: Diagnosis not present

## 2020-01-18 DIAGNOSIS — Z7982 Long term (current) use of aspirin: Secondary | ICD-10-CM | POA: Insufficient documentation

## 2020-01-18 DIAGNOSIS — Z955 Presence of coronary angioplasty implant and graft: Secondary | ICD-10-CM | POA: Insufficient documentation

## 2020-01-18 DIAGNOSIS — E1140 Type 2 diabetes mellitus with diabetic neuropathy, unspecified: Secondary | ICD-10-CM | POA: Diagnosis not present

## 2020-01-18 DIAGNOSIS — I132 Hypertensive heart and chronic kidney disease with heart failure and with stage 5 chronic kidney disease, or end stage renal disease: Secondary | ICD-10-CM | POA: Diagnosis not present

## 2020-01-18 DIAGNOSIS — D631 Anemia in chronic kidney disease: Secondary | ICD-10-CM | POA: Insufficient documentation

## 2020-01-18 DIAGNOSIS — I2510 Atherosclerotic heart disease of native coronary artery without angina pectoris: Secondary | ICD-10-CM | POA: Insufficient documentation

## 2020-01-18 DIAGNOSIS — K3184 Gastroparesis: Secondary | ICD-10-CM | POA: Diagnosis not present

## 2020-01-18 DIAGNOSIS — Z992 Dependence on renal dialysis: Secondary | ICD-10-CM | POA: Insufficient documentation

## 2020-01-18 DIAGNOSIS — F419 Anxiety disorder, unspecified: Secondary | ICD-10-CM | POA: Insufficient documentation

## 2020-01-18 DIAGNOSIS — J449 Chronic obstructive pulmonary disease, unspecified: Secondary | ICD-10-CM | POA: Diagnosis not present

## 2020-01-18 DIAGNOSIS — I871 Compression of vein: Secondary | ICD-10-CM

## 2020-01-18 DIAGNOSIS — E1151 Type 2 diabetes mellitus with diabetic peripheral angiopathy without gangrene: Secondary | ICD-10-CM | POA: Diagnosis not present

## 2020-01-18 DIAGNOSIS — T82510D Breakdown (mechanical) of surgically created arteriovenous fistula, subsequent encounter: Secondary | ICD-10-CM | POA: Insufficient documentation

## 2020-01-18 DIAGNOSIS — K219 Gastro-esophageal reflux disease without esophagitis: Secondary | ICD-10-CM | POA: Diagnosis not present

## 2020-01-18 DIAGNOSIS — T82898A Other specified complication of vascular prosthetic devices, implants and grafts, initial encounter: Secondary | ICD-10-CM | POA: Diagnosis not present

## 2020-01-18 HISTORY — PX: UPPER EXTREMITY VENOGRAPHY: CATH118272

## 2020-01-18 LAB — POTASSIUM (ARMC VASCULAR LAB ONLY): Potassium (ARMC vascular lab): 4.5 (ref 3.5–5.1)

## 2020-01-18 LAB — GLUCOSE, CAPILLARY: Glucose-Capillary: 122 mg/dL — ABNORMAL HIGH (ref 70–99)

## 2020-01-18 SURGERY — UPPER EXTREMITY VENOGRAPHY
Anesthesia: Moderate Sedation | Laterality: Right

## 2020-01-18 MED ORDER — CEFAZOLIN SODIUM-DEXTROSE 1-4 GM/50ML-% IV SOLN: 1.0000 g | Freq: Once | INTRAVENOUS | Status: AC

## 2020-01-18 MED ORDER — DIPHENHYDRAMINE HCL 50 MG/ML IJ SOLN: 50.0000 mg | Freq: Once | INTRAMUSCULAR | Status: DC | PRN

## 2020-01-18 MED ORDER — HEPARIN SODIUM (PORCINE) 1000 UNIT/ML IJ SOLN
INTRAMUSCULAR | Status: AC
  Filled 2020-01-18: qty 1

## 2020-01-18 MED ORDER — FENTANYL CITRATE (PF) 100 MCG/2ML IJ SOLN
INTRAMUSCULAR | Status: DC | PRN
  Administered 2020-01-18: 50 ug via INTRAVENOUS

## 2020-01-18 MED ORDER — CEFAZOLIN SODIUM-DEXTROSE 1-4 GM/50ML-% IV SOLN
INTRAVENOUS | Status: AC
  Administered 2020-01-18: 17:00:00 1 g via INTRAVENOUS
  Filled 2020-01-18: qty 50

## 2020-01-18 MED ORDER — MIDAZOLAM HCL 5 MG/5ML IJ SOLN
INTRAMUSCULAR | Status: AC
  Filled 2020-01-18: qty 5

## 2020-01-18 MED ORDER — FENTANYL CITRATE (PF) 100 MCG/2ML IJ SOLN: 12.5000 ug | Freq: Once | INTRAMUSCULAR | Status: DC | PRN

## 2020-01-18 MED ORDER — SODIUM CHLORIDE 0.9 % IV SOLN
INTRAVENOUS | Status: DC
  Administered 2020-01-18: 11:00:00 1000 mL via INTRAVENOUS

## 2020-01-18 MED ORDER — METHYLPREDNISOLONE SODIUM SUCC 125 MG IJ SOLR: 125.0000 mg | Freq: Once | INTRAMUSCULAR | Status: DC | PRN

## 2020-01-18 MED ORDER — MIDAZOLAM HCL 2 MG/2ML IJ SOLN
INTRAMUSCULAR | Status: DC | PRN
  Administered 2020-01-18: 2 mg via INTRAVENOUS

## 2020-01-18 MED ORDER — FAMOTIDINE 20 MG PO TABS: 40.0000 mg | ORAL_TABLET | Freq: Once | ORAL | Status: DC | PRN

## 2020-01-18 MED ORDER — MIDAZOLAM HCL 2 MG/ML PO SYRP: 8.0000 mg | ORAL_SOLUTION | Freq: Once | ORAL | Status: DC | PRN

## 2020-01-18 MED ORDER — FENTANYL CITRATE (PF) 100 MCG/2ML IJ SOLN
INTRAMUSCULAR | Status: AC
  Filled 2020-01-18: qty 2

## 2020-01-18 SURGICAL SUPPLY — 8 items
CATH BEACON 5 .035 65 KMP TIP (CATHETERS) ×2 IMPLANT
DRAPE BRACHIAL (DRAPES) ×2 IMPLANT
GUIDEWIRE ANGLED .035 180CM (WIRE) ×2 IMPLANT
NEEDLE ENTRY 21GA 7CM ECHOTIP (NEEDLE) ×2 IMPLANT
PACK ANGIOGRAPHY (CUSTOM PROCEDURE TRAY) ×2 IMPLANT
SET INTRO CAPELLA COAXIAL (SET/KITS/TRAYS/PACK) ×2 IMPLANT
SHEATH BRITE TIP 6FRX5.5 (SHEATH) ×2 IMPLANT
TOWEL OR 17X26 4PK STRL BLUE (TOWEL DISPOSABLE) ×2 IMPLANT

## 2020-01-18 NOTE — H&P (Signed)
[facility] History & Physical Update  The patient was interviewed and re-examined.  The patient's previous History and Physical has been reviewed and is unchanged.  There is no change in the plan of care. We plan to proceed with the scheduled procedure.  Hortencia Pilar, MD  01/18/2020, 4:41 PM

## 2020-01-19 ENCOUNTER — Encounter: Payer: Self-pay | Admitting: Cardiology

## 2020-01-19 NOTE — Op Note (Signed)
[facility]  Percutaneous Study/Intervention Procedural Note   Date of Surgery: 01/19/2020,7:43 AM  Surgeon:Basha Krygier, Dolores Lory   Pre-operative Diagnosis: Superior vena cava syndrome; Complication AV vascular device with multiple past accesses; and stage renal disease requiring hemodialysis  Post-operative diagnosis:  Same  Procedure(s) Performed:  1.  Ultrasound-guided access to the right brachial vein  2.  Introduction catheter into superior vena cava right arm approach  3.  Right upper extremity venogram  4.  Superior venacavogram  Anesthesia: Conscious sedation was administered under my direct supervision by the interventional radiology RN. IV Versed plus fentanyl were utilized. Continuous ECG, pulse oximetry and blood pressure was monitored throughout the entire procedure. Conscious sedation was administered for a total of 30 minutes.  Sheath: 6 Fr Pinnacle sheath right brachial vein antegrade  Contrast: 20 cc   Fluoroscopy Time: 1.0 minutes  Indications:  Patient presents for evaluation for possible hero graft placement. The patient has had multiple access disease in both arms and there is prior documentation of central venous stenosis. Venography is being performed to determine whether placement of a hero graft is feasible. The risks and benefits of been reviewed all questions answered patient agrees to proceed.  Procedure:  [name] a 59 y.o. female who was identified and appropriate procedural time out was performed.  The patient was then placed supine on the table and prepped and draped in the usual sterile fashion.  Ultrasound was used to evaluate the right brachial vein.  It was patent .  A digital ultrasound image was acquired.  A micro-puncture needle was used to access the right brachial vein under direct ultrasound guidance and a permanent image was saved for the record.  The microwire was then advanced under fluoroscopic guidance followed by  micro-sheath.  6 French Pinnacle sheath was then exchanged for the microsheath.  Hand injection of contrast was then performed to demonstrate the venous anatomy of the right upper arm.  The central images were not adequate and therefore a floppy Glidewire was advanced through the micro-sheath and negotiated into the central venous system. The micro-sheath was removed and a Kumpe catheter advanced over the wire. Wire was then removed and with the catheter positioned in the central venous anatomy and the superior vena cava further imaging was obtained by hand injection.  The Glidewire and Kumpe catheter were then negotiated into the left distal subclavian vein and imaging of the left subclavian and innominate veins was performed.    Findings:    Right upper extremity: The brachial vein is patent however the axillary vein appears occluded and there is a collateral tributary which fills the subclavian vein on the right.  Subclavian vein appears patent but quite small.  Existing tunneled catheter is placed through the external jugular vein into the subclavian.  There is a 50 to 60% stenosis at the confluence of the subclavian and innominate veins.   Left upper extremity:   There are multiple stents noted in the left innominate and subclavian vein.  These veins remain patent although there is diffuse 50 to 60% stenosis.  Summary: Patient appears to be a good candidate for a hero graft on the right.  Options would include using the existing catheter as the conduit for the venous outflow and placing a femoral catheter or attempting to access the subclavian vein from a subclavian approach.  This would have the disadvantage of having both the venous outflow and the catheter within a already narrowed innominate vein.  Disposition: Patient was taken to the recovery room in stable condition having tolerated the procedure well.  Belenda Cruise Pol[place] Minor 01/19/2020,7:43 AM

## 2020-01-27 ENCOUNTER — Encounter (INDEPENDENT_AMBULATORY_CARE_PROVIDER_SITE_OTHER): Payer: Self-pay | Admitting: Nurse Practitioner

## 2020-01-27 ENCOUNTER — Other Ambulatory Visit: Payer: Self-pay

## 2020-01-27 ENCOUNTER — Ambulatory Visit (INDEPENDENT_AMBULATORY_CARE_PROVIDER_SITE_OTHER): Payer: Medicare Other | Admitting: Nurse Practitioner

## 2020-01-27 VITALS — BP 185/77 | HR 65 | Resp 16 | Wt 173.0 lb

## 2020-01-27 DIAGNOSIS — I10 Essential (primary) hypertension: Secondary | ICD-10-CM

## 2020-01-27 DIAGNOSIS — J449 Chronic obstructive pulmonary disease, unspecified: Secondary | ICD-10-CM | POA: Diagnosis not present

## 2020-01-27 DIAGNOSIS — Z992 Dependence on renal dialysis: Secondary | ICD-10-CM

## 2020-01-27 DIAGNOSIS — N186 End stage renal disease: Secondary | ICD-10-CM

## 2020-01-27 DIAGNOSIS — E1159 Type 2 diabetes mellitus with other circulatory complications: Secondary | ICD-10-CM

## 2020-01-27 NOTE — Progress Notes (Signed)
Subjective:    Patient ID: [name], female    DOB: 1961/06/20, 59 y.o.   MRN: [id number] Chief Complaint  Patient presents with  . Follow-up    [facility] 1week post ue venography     The patient is seen for evaluation of dialysis access.  The patient has a history of multiple failed accesses.  The patient recently underwent a bilateral upper extremity venogram which show that she has adequate access for right hero graft creation.  Current access is via a catheter which is functioning poorly.  Patient states that she is not completely dialyzing due to PermCath issues.  The patient denies amaurosis fugax or recent TIA symptoms. There are no recent neurological changes noted. The patient denies claudication symptoms or rest pain symptoms. The patient denies history of DVT, PE or superficial thrombophlebitis. The patient denies recent episodes of angina or shortness of breath.    Review of Systems  Musculoskeletal: Positive for back pain.  Skin: Positive for wound.  Neurological: Positive for weakness.  All other systems reviewed and are negative.      Objective:   Physical Exam Vitals reviewed.  Constitutional:      Appearance: Normal appearance.  Cardiovascular:     Rate and Rhythm: Normal rate and regular rhythm.     Pulses: Normal pulses.     Comments: Right chest permacath Pulmonary:     Effort: Pulmonary effort is normal.     Breath sounds: Normal breath sounds.  Musculoskeletal:        General: Tenderness (Left upper extremity around incision) present.  Neurological:     Mental Status: She is alert and oriented to person, place, and time. Mental status is at baseline.  Psychiatric:        Mood and Affect: Mood normal.        Behavior: Behavior normal.        Thought Content: Thought content normal.        Judgment: Judgment normal.     BP (!) 185/77 (BP Location: Right Arm)   Pulse 65   Resp 16   Wt 173 lb (78.5 kg)   BMI 28.79 kg/m   Past Medical History:   Diagnosis Date  . Anemia   . Anginal pain ([place])   . Anxiety   . Arthritis   . Asthma   . Broken wrist   . Bronchitis   . chronic diastolic CHF 4/43/1540  . Chronic kidney disease   . COPD (chronic obstructive pulmonary disease) ([place])   . Coronary artery disease    a. cath 2013: stenting to RCA (report not available); b. cath 2014: LM nl, pLAD 40%, mLAD nl, ost LCx 40%, mid LCx nl, pRCA 30% @ site of prior stent, mRCA 50%  . Depression   . Diabetes mellitus ([place])   . Diabetes mellitus without complication ([place])   . Diabetic neuropathy ([place])   . dialysis 2006  . Diverticulosis   . Dizziness   . Dyspnea   . Elevated lipids   . Environmental and seasonal allergies   . ESRD (end stage renal disease) on dialysis ([place])    M-W-F  . Gastroparesis   . GERD (gastroesophageal reflux disease)   . Headache   . History of anemia due to chronic kidney disease   . History of hiatal hernia   . HOH (hard of hearing)   . Hx of pancreatitis 2015  . Hypertension   . Lower extremity edema   . Mitral regurgitation  a. echo 10/2013: EF 62%, noWMA, mildly dilated LA, mild to mod MR/TR, GR1DD  . Myocardial infarction ([place])   . Orthopnea   . Parathyroid abnormality ([place])   . Peripheral arterial disease ([place])   . Pneumonia   . Renal cancer ([place])   . Renal insufficiency    Pt is on dialysis on M,W + F.  . Wheezing     Social History   Socioeconomic History  . Marital status: Divorced    Spouse name: Not on file  . Number of children: Not on file  . Years of education: Not on file  . Highest education level: Not on file  Occupational History  . Not on file  Tobacco Use  . Smoking status: Never Smoker  . Smokeless tobacco: Never Used  Substance and Sexual Activity  . Alcohol use: Not Currently    Comment: glass wine week per pt  . Drug use: Yes    Types: Marijuana    Comment: once a day  . Sexual activity: Never  Other Topics Concern  . Not on file  Social History Narrative    ** Merged History Encounter **       Social Determinants of Health   Financial Resource Strain:   . Difficulty of Paying Living Expenses:   Food Insecurity:   . Worried About Charity fundraiser in the Last Year:   . Arboriculturist in the Last Year:   Transportation Needs:   . Film/video editor (Medical):   Marland Kitchen Lack of Transportation (Non-Medical):   Physical Activity:   . Days of Exercise per Week:   . Minutes of Exercise per Session:   Stress:   . Feeling of Stress :   Social Connections:   . Frequency of Communication with Friends and Family:   . Frequency of Social Gatherings with Friends and Family:   . Attends Religious Services:   . Active Member of Clubs or Organizations:   . Attends Archivist Meetings:   Marland Kitchen Marital Status:   Intimate Partner Violence:   . Fear of Current or Ex-Partner:   . Emotionally Abused:   Marland Kitchen Physically Abused:   . Sexually Abused:     Past Surgical History:  Procedure Laterality Date  . A/V SHUNTOGRAM Left 01/20/2018   Procedure: A/V SHUNTOGRAM;  Surgeon: Algernon Huxley, MD;  Location: [place] [facility];  Service: Cardiovascular;  Laterality: Left;  . ABDOMINAL HYSTERECTOMY  1992  . AMPUTATION TOE Left 10/02/2017   Procedure: AMPUTATION TOE-LEFT GREAT TOE;  Surgeon: Albertine Patricia, DPM;  Location: [facility];  Service: Podiatry;  Laterality: Left;  . APPENDECTOMY    . APPLICATION OF WOUND VAC N/A 11/25/2019   Procedure: APPLICATION OF WOUND VAC;  Surgeon: Katha Cabal, MD;  Location: [facility];  Service: Vascular;  Laterality: N/A;  . ARTERY BIOPSY Right 10/11/2018   Procedure: BIOPSY TEMPORAL ARTERY;  Surgeon: Vickie Epley, MD;  Location: [facility];  Service: General;  Laterality: Right;  . CARDIAC CATHETERIZATION Left 07/26/2015   Procedure: Left Heart Cath and Coronary Angiography;  Surgeon: Dionisio David, MD;  Location:  CV LAB;  Service: Cardiovascular;  Laterality: Left;  . CATARACT EXTRACTION W/  INTRAOCULAR LENS IMPLANT Right   . CATARACT EXTRACTION W/PHACO Left 03/10/2017   Procedure: CATARACT EXTRACTION PHACO AND INTRAOCULAR LENS PLACEMENT (IOC);  Surgeon: Birder Robson, MD;  Location: [facility];  Service: Ophthalmology;  Laterality: Left;  [place] 00:51.9 AP% 14.2 CDE 7.39  fluid pack lot # [id number]  . CENTRAL LINE INSERTION Right 11/11/2019   Procedure: CENTRAL LINE INSERTION;  Surgeon: Katha Cabal, MD;  Location: [facility];  Service: Vascular;  Laterality: Right;  . CENTRAL LINE INSERTION  11/25/2019   Procedure: CENTRAL LINE INSERTION;  Surgeon: Katha Cabal, MD;  Location: [facility];  Service: Vascular;;  . CHOLECYSTECTOMY    . COLONOSCOPY WITH PROPOFOL N/A 08/12/2016   Procedure: COLONOSCOPY WITH PROPOFOL;  Surgeon: Lollie Sails, MD;  Location[facility];  Service: Endoscopy;  Laterality: N/A;  . DIALYSIS FISTULA CREATION Left    upper arm  . dialysis grafts    . DIALYSIS/PERMA CATHETER INSERTION N/A 11/14/2019   Procedure: DIALYSIS/PERMA CATHETER INSERTION;  Surgeon: Algernon Huxley, MD;  Location: [place] [facility];  Service: Cardiovascular;  Laterality: N/A;  . ESOPHAGOGASTRODUODENOSCOPY N/A 03/08/2015   Procedure: ESOPHAGOGASTRODUODENOSCOPY (EGD);  Surgeon: Manya Silvas, MD;  Location: [facility] ENDOSCOPY;  Service: Endoscopy;  Laterality: N/A;  . ESOPHAGOGASTRODUODENOSCOPY (EGD) WITH PROPOFOL N/A 03/18/2016   Procedure: ESOPHAGOGASTRODUODENOSCOPY (EGD) WITH PROPOFOL;  Surgeon: [name], MD;  Location: [facility] ENDOSCOPY;  Service: Endoscopy;  Laterality: N/A;  . EYE SURGERY Right 2018  . FECAL TRANSPLANT N/A 08/23/2015   Procedure: FECAL TRANSPLANT;  Surgeon: Manya Silvas, MD;  Location: [facility] ENDOSCOPY;  Service: Endoscopy;  Laterality: N/A;  . HAND SURGERY Bilateral   . HEMATOMA EVACUATION Left 11/25/2019   Procedure: EVACUATION HEMATOMA;  Surgeon: Katha Cabal, MD;  Location: [facility];  Service: Vascular;  Laterality: Left;  . I & D EXTREMITY Left  11/25/2019   Procedure: IRRIGATION AND DEBRIDEMENT EXTREMITY;  Surgeon: Katha Cabal, MD;  Location: [facility];  Service: Vascular;  Laterality: Left;  . IR RADIOLOGIST EVAL & MGMT  07/28/2019  . IR RADIOLOGIST EVAL & MGMT  08/11/2019  . LIGATION OF ARTERIOVENOUS  FISTULA Left 11/11/2019  . LIGATION OF ARTERIOVENOUS  FISTULA Left 11/11/2019   Procedure: LIGATION OF ARTERIOVENOUS  FISTULA;  Surgeon: Katha Cabal, MD;  Location: [facility];  Service: Vascular;  Laterality: Left;  . PERIPHERAL VASCULAR CATHETERIZATION N/A 12/20/2015   Procedure: Thrombectomy of dialysis access versus permcath placement;  Surgeon: Algernon Huxley, MD;  Location: [place] [facility];  Service: Cardiovascular;  Laterality: N/A;  . PERIPHERAL VASCULAR CATHETERIZATION N/A 12/20/2015   Procedure: A/V Shunt Intervention;  Surgeon: Algernon Huxley, MD;  Location: [place] [facility];  Service: Cardiovascular;  Laterality: N/A;  . PERIPHERAL VASCULAR CATHETERIZATION N/A 12/20/2015   Procedure: A/V Shuntogram/Fistulagram;  Surgeon: Algernon Huxley, MD;  Location: [place] [facility];  Service: Cardiovascular;  Laterality: N/A;  . PERIPHERAL VASCULAR CATHETERIZATION N/A 01/02/2016   Procedure: A/V Shuntogram/Fistulagram;  Surgeon: Algernon Huxley, MD;  Location: [place] [facility];  Service: Cardiovascular;  Laterality: N/A;  . PERIPHERAL VASCULAR CATHETERIZATION N/A 01/02/2016   Procedure: A/V Shunt Intervention;  Surgeon: Algernon Huxley, MD;  Location: [place] [facility];  Service: Cardiovascular;  Laterality: N/A;  . UPPER EXTREMITY VENOGRAPHY Right 01/18/2020   Procedure: UPPER EXTREMITY VENOGRAPHY;  Surgeon: Katha Cabal, MD;  Location: [place] [facility];  Service: Cardiovascular;  Laterality: Right;    Family History  Problem Relation Age of Onset  . Kidney disease Mother   . Diabetes Mother   . Cancer Father   . Kidney disease Sister     Allergies  Allergen Reactions  . Ace Inhibitors Swelling and Anaphylaxis  .  Ativan [Lorazepam] Other (See Comments)  Reaction:Hallucinations and headaches  . Compazine [Prochlorperazine Edisylate] Anaphylaxis, Nausea And Vomiting and Other (See Comments)    Other reaction(s): dystonia from this vs. Reglan, 23 Jul - patient relates that she takes promethazine frequently with no problems  . Sumatriptan Succinate Other (See Comments)    Other reaction(s): delirium and hallucinations per [facility] records  . Dilaudid [Hydromorphone Hcl] Other (See Comments)    Delirium   . Ondansetron Other (See Comments)    hallucinations    . Zofran [Ondansetron Hcl] Other (See Comments)    Reaction:  hallucinations   . Dilaudid [Hydromorphone]     Unknown reaction  . Morphine And Related     Unknown reaction  . Oxycontin [Oxycodone Hcl]     Unknown reaction  . Zofran [Ondansetron]     Per pt. she is allergic to zofran or will experience adverse reaction like hallucination   . Codeine Nausea And Vomiting  . Gabapentin Other (See Comments)    Reaction:  Unknown    . [place] Nausea And Vomiting  . Losartan Nausea Only  . Oxycodone Anxiety  . Prochlorperazine Other (See Comments)    Reaction:  Unknown . Patient does not remember reaction but she does have vertigo and anxiety along with n and v at times. Could be used to treat any of these   . Reglan [Metoclopramide] Other (See Comments)    Per patient her Dr. Evelina Bucy her off it   . Scopolamine Other (See Comments)    Dizziness, also has vertigo already  . Tape Rash  . Tapentadol Rash       Assessment & Plan:   1. End-stage renal disease on hemodialysis ([place]) Recommend:  At this time the patient does not have appropriate extremity access for dialysis  Patient should have a right Hero Graft created.  The risks, benefits and alternative therapies were reviewed in detail with the patient.  All questions were answered.  The patient agrees to proceed with surgery.   The patient is also reporting issues with her  PermCath.  We will arrange for PermCath exchange to be done separately from the right hero graft insertion.  Discussed the risk, benefits and alternatives of the PermCath exchange and patient does agree to proceed.   2. Essential hypertension Continue antihypertensive medications as already ordered, these medications have been reviewed and there are no changes at this time.   3. Type 2 diabetes mellitus with other circulatory complication, unspecified whether long term insulin use (HCC) Continue hypoglycemic medications as already ordered, these medications have been reviewed and there are no changes at this time.  Hgb A1C to be monitored as already arranged by primary service   4. Chronic obstructive pulmonary disease, unspecified COPD type ([place]) Continue pulmonary medications and aerosols as already ordered, these medications have been reviewed and there are no changes at this time.     Current Outpatient Medications on File Prior to Visit  Medication Sig Dispense Refill  . albuterol (PROVENTIL) (2.5 MG/3ML) 0.083% nebulizer solution Take 2.5 mg by nebulization every 4 (four) hours as needed for wheezing or shortness of breath.    Marland Kitchen albuterol (VENTOLIN HFA) 108 (90 Base) MCG/ACT inhaler Inhale 2 puffs into the lungs every 4 (four) hours as needed for wheezing or shortness of breath.    Marland Kitchen amLODipine (NORVASC) 10 MG tablet Take 10 mg by mouth daily.     Marland Kitchen aspirin EC 81 MG tablet Take 81 mg by mouth daily.    Marland Kitchen atorvastatin (LIPITOR)  20 MG tablet Take 20 mg by mouth daily.    . carvedilol (COREG) 6.25 MG tablet Take 6.25 mg by mouth 2 (two) times daily.    . cholecalciferol (VITAMIN D) 400 units TABS tablet Take 400 Units by mouth daily.    . Cholecalciferol (VITAMIN D3) 25 MCG (1000 UT) CAPS Take 1 capsule by mouth daily.    . cinacalcet (SENSIPAR) 30 MG tablet Take 30 mg by mouth daily.    . citalopram (CELEXA) 20 MG tablet Take 20 mg by mouth daily.    . clopidogrel (PLAVIX) 75 MG  tablet Take 75 mg by mouth daily.    . COMBIVENT RESPIMAT 20-100 MCG/ACT AERS respimat Inhale 2 puffs into the lungs every 6 (six) hours as needed for wheezing or shortness of breath.     . CREON 12000 units CPEP capsule Take 12,000 Units by mouth 3 (three) times daily before meals. And twice a day with snacks    . cyanocobalamin 1000 MCG tablet Take 1,000 mcg by mouth daily.     . diclofenac sodium (VOLTAREN) 1 % GEL Apply 1 application topically daily as needed (for rash).    . Ferrous Sulfate (IRON) 325 (65 Fe) MG TABS Take 1 tablet by mouth daily.    . fluticasone (FLONASE) 50 MCG/ACT nasal spray Place 2 sprays into both nostrils daily.    Marland Kitchen gabapentin (NEURONTIN) 100 MG capsule Take 100-200 mg by mouth See admin instructions. Take one capsule (100 mg) twice daily at 0800 and 1430 and two capsules (200 mg) at bedtime    . hydrALAZINE (APRESOLINE) 10 MG tablet Take 10 mg by mouth 3 (three) times daily.    Marland Kitchen HYDROcodone-acetaminophen (NORCO/VICODIN) 5-325 MG tablet Take 1 tablet by mouth 4 (four) times daily as needed for pain.    . hydrocortisone cream 0.5 % Apply topically 3 (three) times daily. 30 g 0  . hydrOXYzine (ATARAX/VISTARIL) 25 MG tablet Take 25 mg by mouth daily. On tues, thurs, and sat    . insulin aspart (NOVOLOG) 100 UNIT/ML injection Inject 4-15 Units into the skin in the morning, at noon, in the evening, and at bedtime.    . meclizine (ANTIVERT) 25 MG tablet Take 12.5 mg by mouth every 6 (six) hours.     . multivitamin (RENA-VIT) TABS tablet Take 1 tablet by mouth daily.     . mupirocin ointment (BACTROBAN) 2 % Apply 1 application topically 3 (three) times daily.    . nitroGLYCERIN (NITROSTAT) 0.4 MG SL tablet Place 0.4 mg under the tongue every 5 (five) minutes as needed for chest pain.     . Omega-3 300 MG CAPS Take 1 capsule by mouth 2 (two) times daily.    . pantoprazole (PROTONIX) 40 MG tablet Take 40 mg by mouth daily.    . Probiotic Product (PROBIOTIC PO) Take 1 capsule  by mouth daily.    . sevelamer carbonate (RENVELA) 800 MG tablet Take 800-2,400 mg by mouth See admin instructions. Take 2400 mg by mouth 3 times daily with meals and take 800 mg by mouth with snacks    . Turmeric (QC TUMERIC COMPLEX PO) Take 250 mg by mouth 1 day or 1 dose.     Marland Kitchen Zinc 50 MG CAPS Take 1 capsule by mouth daily.    . promethazine (PHENERGAN) 12.5 MG tablet Take 1 tablet (12.5 mg total) by mouth every 8 (eight) hours as needed for nausea. (Patient not taking: Reported on 01/18/2020) 30 tablet 0   No  current facility-administered medications on file prior to visit.    There are no Patient Instructions on file for this visit. No follow-ups on file.   Kris Hartmann, NP

## 2020-01-31 ENCOUNTER — Telehealth (INDEPENDENT_AMBULATORY_CARE_PROVIDER_SITE_OTHER): Payer: Self-pay

## 2020-01-31 NOTE — Telephone Encounter (Signed)
Spoke with the patient and she is now scheduled with Dr. Delana Meyer for a permcath exchange on 02/03/20 with a 9:00 am arrival time to the [facility]. Patient will do covid testing on 02/01/20 between 8-1 pm at the [place]. Pre-procedure instructions were discussed and will be faxed to [facility].

## 2020-02-01 ENCOUNTER — Other Ambulatory Visit
Admission: RE | Admit: 2020-02-01 | Discharge: 2020-02-01 | Disposition: A | Discharge status: Home / Self Care | Payer: Medicare Other | Source: Ambulatory Visit | Attending: Vascular Surgery | Admitting: Vascular Surgery

## 2020-02-01 DIAGNOSIS — Z01812 Encounter for preprocedural laboratory examination: Secondary | ICD-10-CM | POA: Insufficient documentation

## 2020-02-01 DIAGNOSIS — Z20822 Contact with and (suspected) exposure to covid-19: Secondary | ICD-10-CM | POA: Diagnosis not present

## 2020-02-01 LAB — SARS CORONAVIRUS 2 (TAT 6-24 HRS): SARS Coronavirus 2: NEGATIVE

## 2020-02-02 ENCOUNTER — Other Ambulatory Visit (INDEPENDENT_AMBULATORY_CARE_PROVIDER_SITE_OTHER): Payer: Self-pay | Admitting: Nurse Practitioner

## 2020-02-03 ENCOUNTER — Ambulatory Visit
Admission: RE | Admit: 2020-02-03 | Discharge: 2020-02-03 | Disposition: A | Discharge status: Home / Self Care | Payer: Medicare Other | Attending: Vascular Surgery | Admitting: Vascular Surgery

## 2020-02-03 ENCOUNTER — Encounter
Admission: RE | Disposition: A | Discharge status: Home / Self Care | Payer: Self-pay | Source: Home / Self Care | Attending: Vascular Surgery

## 2020-02-03 ENCOUNTER — Encounter: Payer: Self-pay | Admitting: Vascular Surgery

## 2020-02-03 ENCOUNTER — Other Ambulatory Visit: Payer: Self-pay

## 2020-02-03 DIAGNOSIS — Z794 Long term (current) use of insulin: Secondary | ICD-10-CM | POA: Insufficient documentation

## 2020-02-03 DIAGNOSIS — Z7982 Long term (current) use of aspirin: Secondary | ICD-10-CM | POA: Diagnosis not present

## 2020-02-03 DIAGNOSIS — Z79899 Other long term (current) drug therapy: Secondary | ICD-10-CM | POA: Insufficient documentation

## 2020-02-03 DIAGNOSIS — E1122 Type 2 diabetes mellitus with diabetic chronic kidney disease: Secondary | ICD-10-CM | POA: Insufficient documentation

## 2020-02-03 DIAGNOSIS — N186 End stage renal disease: Secondary | ICD-10-CM | POA: Insufficient documentation

## 2020-02-03 DIAGNOSIS — Z992 Dependence on renal dialysis: Secondary | ICD-10-CM

## 2020-02-03 DIAGNOSIS — Z7902 Long term (current) use of antithrombotics/antiplatelets: Secondary | ICD-10-CM | POA: Diagnosis not present

## 2020-02-03 DIAGNOSIS — I120 Hypertensive chronic kidney disease with stage 5 chronic kidney disease or end stage renal disease: Secondary | ICD-10-CM | POA: Insufficient documentation

## 2020-02-03 DIAGNOSIS — T82898A Other specified complication of vascular prosthetic devices, implants and grafts, initial encounter: Secondary | ICD-10-CM

## 2020-02-03 DIAGNOSIS — J449 Chronic obstructive pulmonary disease, unspecified: Secondary | ICD-10-CM | POA: Insufficient documentation

## 2020-02-03 DIAGNOSIS — Z4901 Encounter for fitting and adjustment of extracorporeal dialysis catheter: Secondary | ICD-10-CM | POA: Diagnosis present

## 2020-02-03 HISTORY — PX: DIALYSIS/PERMA CATHETER INSERTION: CATH118288

## 2020-02-03 LAB — GLUCOSE, CAPILLARY: Glucose-Capillary: 99 mg/dL (ref 70–99)

## 2020-02-03 LAB — POTASSIUM (ARMC VASCULAR LAB ONLY): Potassium (ARMC vascular lab): 3.7 (ref 3.5–5.1)

## 2020-02-03 SURGERY — DIALYSIS/PERMA CATHETER INSERTION
Anesthesia: Moderate Sedation

## 2020-02-03 MED ORDER — FENTANYL CITRATE (PF) 100 MCG/2ML IJ SOLN
INTRAMUSCULAR | Status: AC
  Filled 2020-02-03: qty 2

## 2020-02-03 MED ORDER — SODIUM CHLORIDE 0.9 % IV SOLN: INTRAVENOUS | Status: DC

## 2020-02-03 MED ORDER — MIDAZOLAM HCL 2 MG/ML PO SYRP: 8.0000 mg | ORAL_SOLUTION | Freq: Once | ORAL | Status: DC | PRN

## 2020-02-03 MED ORDER — FENTANYL CITRATE (PF) 100 MCG/2ML IJ SOLN: 12.5000 ug | Freq: Once | INTRAMUSCULAR | Status: DC | PRN

## 2020-02-03 MED ORDER — FAMOTIDINE 20 MG PO TABS: 40.0000 mg | ORAL_TABLET | Freq: Once | ORAL | Status: DC | PRN

## 2020-02-03 MED ORDER — METHYLPREDNISOLONE SODIUM SUCC 125 MG IJ SOLR: 125.0000 mg | Freq: Once | INTRAMUSCULAR | Status: DC | PRN

## 2020-02-03 MED ORDER — DIPHENHYDRAMINE HCL 50 MG/ML IJ SOLN: 50.0000 mg | Freq: Once | INTRAMUSCULAR | Status: DC | PRN

## 2020-02-03 MED ORDER — CEFAZOLIN SODIUM-DEXTROSE 1-4 GM/50ML-% IV SOLN
1.0000 g | Freq: Once | INTRAVENOUS | Status: AC
  Administered 2020-02-03: 1 g via INTRAVENOUS

## 2020-02-03 MED ORDER — FENTANYL CITRATE (PF) 100 MCG/2ML IJ SOLN
INTRAMUSCULAR | Status: DC | PRN
  Administered 2020-02-03: 50 ug via INTRAVENOUS
  Administered 2020-02-03 (×2): 25 ug via INTRAVENOUS

## 2020-02-03 MED ORDER — BACITRACIN-NEOMYCIN-POLYMYXIN 400-5-5000 EX OINT
TOPICAL_OINTMENT | CUTANEOUS | Status: AC
  Filled 2020-02-03: qty 1

## 2020-02-03 MED ORDER — HEPARIN SODIUM (PORCINE) 10000 UNIT/ML IJ SOLN
INTRAMUSCULAR | Status: AC
  Filled 2020-02-03: qty 1

## 2020-02-03 MED ORDER — MIDAZOLAM HCL 5 MG/5ML IJ SOLN
INTRAMUSCULAR | Status: AC
  Filled 2020-02-03: qty 5

## 2020-02-03 MED ORDER — MIDAZOLAM HCL 2 MG/2ML IJ SOLN
INTRAMUSCULAR | Status: DC | PRN
  Administered 2020-02-03: 1 mg via INTRAVENOUS
  Administered 2020-02-03: 2 mg via INTRAVENOUS
  Administered 2020-02-03: 1 mg via INTRAVENOUS

## 2020-02-03 SURGICAL SUPPLY — 13 items
BALLN ULTRVRSE 10X40X75 (BALLOONS) ×2
BALLOON ULTRVRSE 10X40X75 (BALLOONS) ×1 IMPLANT
CATH CANNON HEMO 15FR 19 (HEMODIALYSIS SUPPLIES) ×2 IMPLANT
CATH PALIN MAXID VT KIT 19CM (CATHETERS) ×4 IMPLANT
DERMABOND ADVANCED (GAUZE/BANDAGES/DRESSINGS) ×1
DERMABOND ADVANCED .7 DNX12 (GAUZE/BANDAGES/DRESSINGS) ×1 IMPLANT
DEVICE PRESTO INFLATION (MISCELLANEOUS) ×2 IMPLANT
GUIDEWIRE SUPER STIFF .035X180 (WIRE) ×2 IMPLANT
PACK ANGIOGRAPHY (CUSTOM PROCEDURE TRAY) ×2 IMPLANT
SHEATH PINNACLE 11FRX10 (SHEATH) ×2 IMPLANT
SUT MNCRL 4-0 (SUTURE) ×2
SUT MNCRL 4-0 27XMFL (SUTURE) ×1
SUTURE MNCRL 4-0 27XMF (SUTURE) ×1 IMPLANT

## 2020-02-03 NOTE — Op Note (Signed)
OPERATIVE NOTE   PROCEDURE: 1. Insertion of tunneled dialysis catheter right IJ approach same venous access.  PRE-OPERATIVE DIAGNOSIS: Nonfunction of existing tunneled dialysis catheter, and stage renal disease requiring hemodialysis   POST-OPERATIVE DIAGNOSIS: Same SURGEON: Hortencia Pilar  ANESTHESIA: Conscious sedation was administered under my direct supervision by the interventional radiology RN.  IV Versed plus fentanyl were utilized. Continuous ECG, pulse oximetry and blood pressure was monitored throughout the entire procedure.  Conscious sedation was for a total of 45 minutes.  ESTIMATED BLOOD LOSS: Minimal cc  CONTRAST USED:  None  FLUOROSCOPY TIME: 2.1 minutes  INDICATIONS:   [name] is a 59 y.o.y.o. female who presents with poor flow and nonfunction of the tunneled dialysis catheter.  Adequate dialysis has not been possible.  DESCRIPTION: After obtaining full informed written consent, the patient was positioned supine. The right neck and chest wall was prepped and draped in a sterile fashion. The cuff is localized and using blunt and sharp dissection it is freed from the surrounding adhesions.  The existing catheter is then transected proximal to the cuff.  The guidewire is advanced without difficulty under fluoroscopy.  Dilators are passed over the wire as needed and the tunneled dialysis catheter is fed into the central venous system without difficulty.  Under fluoroscopy the catheter tip positioned at the atrial caval junction however this resulted in the cuff being exposed and therefore I tried a second catheter, a Mahurkar but again the cuff was exposed.  This necessitated creating a separate longer tunnel.  A small incision was made at the base of the neck and the catheter was grasped and pulled through it was then transected.  Exit site was selected and a small incision was made.  A wire was then fed from the exit site into the existing catheter and the catheter was  removed.  A 19 cm tip to cuff Mahurkar catheter was then fed through the new tunnel over the Amplatz wire and positioned with the tip in the mid atrium.  Both lumens aspirate and flush easily. After verification of smooth contour with proper tip position under fluoroscopy the catheter is packed with 5000 units of heparin per lumen.  Catheter secured to the skin of the right chest wall with 0 silk. A sterile dressing is applied with a Biopatch.  COMPLICATIONS: None  CONDITION: Good  Hortencia Pilar [facility] Office:  [phone]   02/03/2020,1:31 PM

## 2020-02-03 NOTE — H&P (Signed)
[facility] History & Physical Update  The patient was interviewed and re-examined.  The patient's previous History and Physical has been reviewed and is unchanged.  There is no change in the plan of care. We plan to proceed with the scheduled procedure.  Hortencia Pilar, MD  02/03/2020, 12:28 PM

## 2020-02-05 IMAGING — CR DG CHEST 2V
1 series · 2 of 2 positions shown · non-contrast
Comparison: October 09, 2018

CLINICAL DATA: Chest pain and shortness of breath.  Renal failure

EXAM:
CHEST - 2 VIEW

[Series 1: dg chest 2 view · 0.14mm/px · 2 of 2 slices shown]
[im 1/2]
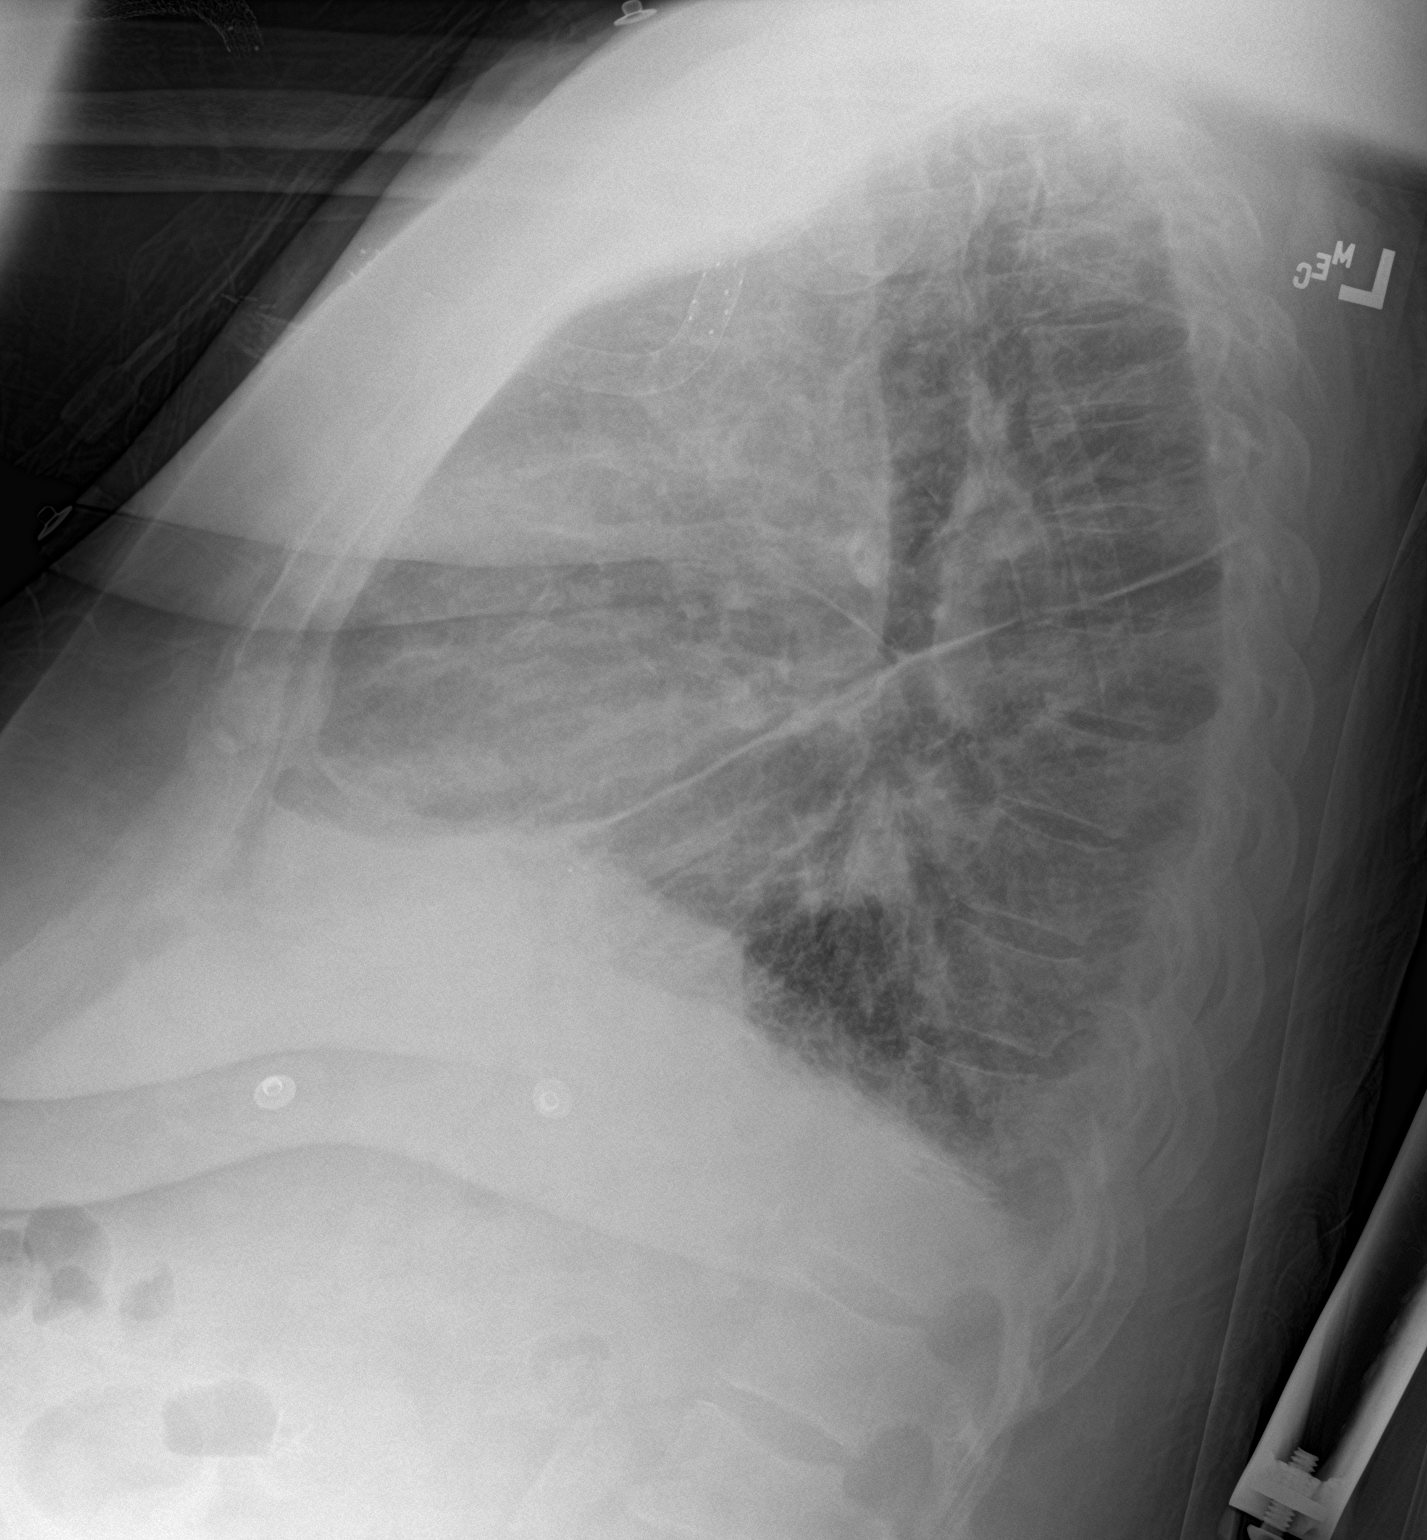
[im 2/2]
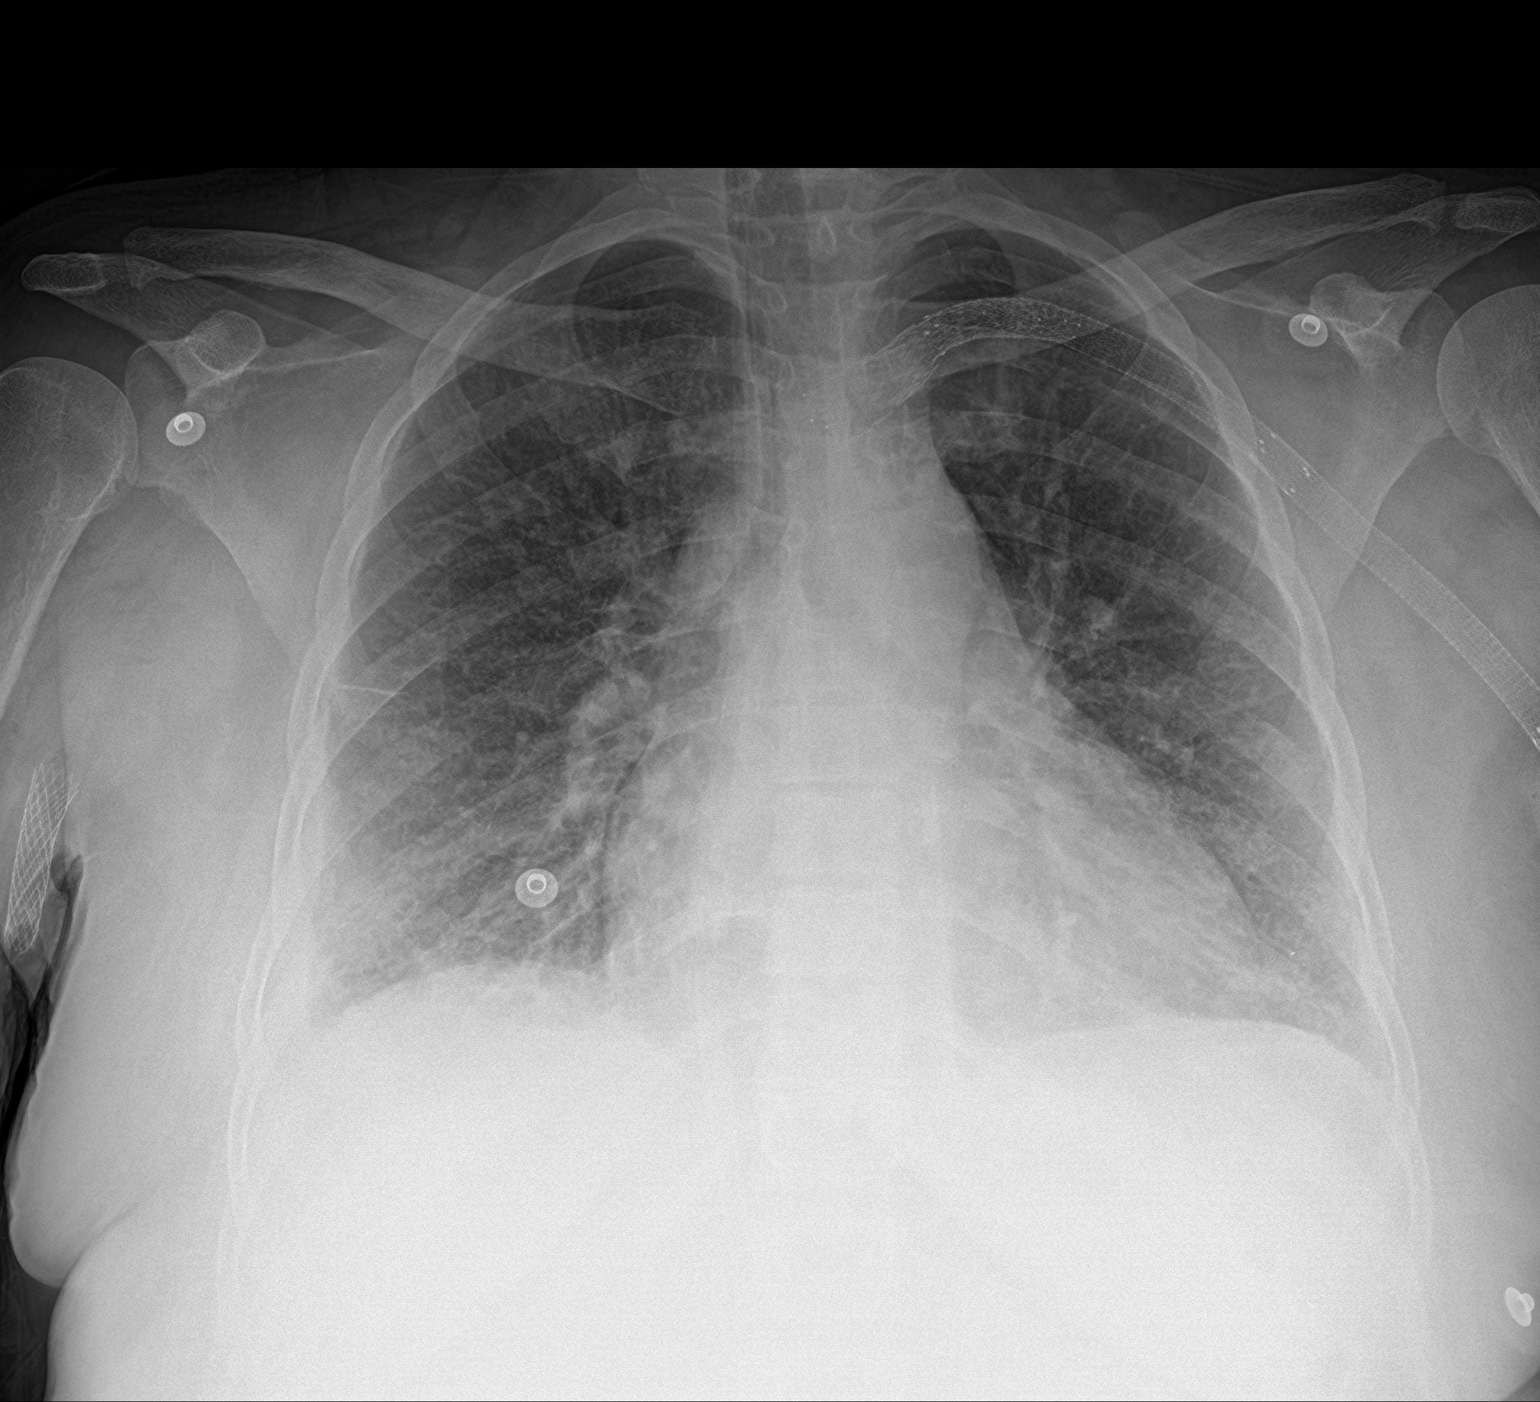

[2 of 2 positions shown; findings below may reference images not displayed]

FINDINGS: There is ill-defined airspace opacity in the lateral right base. No
other airspace opacity evident. There remains interstitial
prominence with questionable interstitial edema. There is
cardiomegaly with mild pulmonary venous hypertension. No adenopathy.
Extensive stent from the left subclavian to the left brachial region
noted. No bone lesions. No pneumothorax.
IMPRESSION: Suspect small focus of pneumonia in the lateral right base. No other
airspace opacity evident.

Stable cardiomegaly with a degree of pulmonary vascular congestion.
Interstitial prominence may represent mild chronic interstitial
edema. This appearance is stable from 1 year prior.

Extensive stent placement on the left.

## 2020-02-06 ENCOUNTER — Encounter: Payer: Self-pay | Admitting: Cardiology

## 2020-02-08 DIAGNOSIS — R0602 Shortness of breath: Secondary | ICD-10-CM | POA: Insufficient documentation

## 2020-02-09 ENCOUNTER — Other Ambulatory Visit: Payer: Self-pay | Admitting: Internal Medicine

## 2020-02-09 DIAGNOSIS — R0602 Shortness of breath: Secondary | ICD-10-CM

## 2020-02-14 ENCOUNTER — Ambulatory Visit: Payer: Medicare Other | Admitting: Urology

## 2020-02-22 ENCOUNTER — Encounter (INDEPENDENT_AMBULATORY_CARE_PROVIDER_SITE_OTHER): Payer: Self-pay | Admitting: Nurse Practitioner

## 2020-02-22 ENCOUNTER — Other Ambulatory Visit: Payer: Self-pay

## 2020-02-22 ENCOUNTER — Ambulatory Visit (INDEPENDENT_AMBULATORY_CARE_PROVIDER_SITE_OTHER): Payer: Medicare Other | Admitting: Nurse Practitioner

## 2020-02-22 VITALS — BP 184/76 | HR 64 | Resp 16 | Wt 181.2 lb

## 2020-02-22 DIAGNOSIS — N186 End stage renal disease: Secondary | ICD-10-CM | POA: Diagnosis not present

## 2020-02-22 DIAGNOSIS — Z992 Dependence on renal dialysis: Secondary | ICD-10-CM

## 2020-02-22 DIAGNOSIS — E1159 Type 2 diabetes mellitus with other circulatory complications: Secondary | ICD-10-CM

## 2020-02-22 DIAGNOSIS — I10 Essential (primary) hypertension: Secondary | ICD-10-CM

## 2020-02-22 DIAGNOSIS — S40022D Contusion of left upper arm, subsequent encounter: Secondary | ICD-10-CM

## 2020-02-24 ENCOUNTER — Other Ambulatory Visit: Payer: Self-pay

## 2020-02-24 ENCOUNTER — Encounter
Admission: RE | Admit: 2020-02-24 | Discharge: 2020-02-24 | Disposition: A | Discharge status: Home / Self Care | Payer: Medicare Other | Source: Ambulatory Visit | Attending: Internal Medicine | Admitting: Internal Medicine

## 2020-02-24 ENCOUNTER — Ambulatory Visit
Admission: RE | Admit: 2020-02-24 | Discharge: 2020-02-24 | Disposition: A | Discharge status: Home / Self Care | Payer: Medicare Other | Source: Ambulatory Visit | Attending: Internal Medicine | Admitting: Internal Medicine

## 2020-02-24 DIAGNOSIS — J449 Chronic obstructive pulmonary disease, unspecified: Secondary | ICD-10-CM | POA: Diagnosis not present

## 2020-02-24 DIAGNOSIS — R0602 Shortness of breath: Secondary | ICD-10-CM | POA: Diagnosis not present

## 2020-02-24 DIAGNOSIS — E1122 Type 2 diabetes mellitus with diabetic chronic kidney disease: Secondary | ICD-10-CM | POA: Insufficient documentation

## 2020-02-24 DIAGNOSIS — R0600 Dyspnea, unspecified: Secondary | ICD-10-CM | POA: Diagnosis not present

## 2020-02-24 DIAGNOSIS — N186 End stage renal disease: Secondary | ICD-10-CM | POA: Insufficient documentation

## 2020-02-24 LAB — NM MYOCAR MULTI W/SPECT W/WALL MOTION / EF
Estimated workload: 1 METS
Exercise duration (min): 1 min
Exercise duration (sec): 0 s
LV dias vol: 111 mL (ref 46–106)
LV sys vol: 43 mL
MPHR: 160 {beats}/min
Peak HR: 69 {beats}/min
Percent HR: 43 %
Rest HR: 67 {beats}/min
SDS: 0
SRS: 1
SSS: 0
TID: 0.96

## 2020-02-24 MED ORDER — TECHNETIUM TC 99M TETROFOSMIN IV KIT
10.6900 | PACK | Freq: Once | INTRAVENOUS | Status: AC | PRN
  Administered 2020-02-24: 10.69 via INTRAVENOUS

## 2020-02-24 MED ORDER — TECHNETIUM TC 99M TETROFOSMIN IV KIT
30.4200 | PACK | Freq: Once | INTRAVENOUS | Status: AC | PRN
  Administered 2020-02-24: 30.42 via INTRAVENOUS

## 2020-02-24 MED ORDER — REGADENOSON 0.4 MG/5ML IV SOLN
0.4000 mg | Freq: Once | INTRAVENOUS | Status: AC
  Administered 2020-02-24: 0.4 mg via INTRAVENOUS

## 2020-02-24 NOTE — Progress Notes (Signed)
*  PRELIMINARY RESULTS* Echocardiogram 2D Echocardiogram has been performed.  Sherrie Sport 02/24/2020, 10:12 AM

## 2020-02-27 ENCOUNTER — Encounter (INDEPENDENT_AMBULATORY_CARE_PROVIDER_SITE_OTHER): Payer: Self-pay | Admitting: Nurse Practitioner

## 2020-02-27 NOTE — Progress Notes (Signed)
Subjective:    Patient ID: [name], female    DOB: Jan 10, 1961, 59 y.o.   MRN: [id number] Chief Complaint  Patient presents with  . Follow-up    [facility] 2week post perm cath insertion     The patient is seen for evaluation of dialysis access.  The patient has a history of multiple failed accesses.  The patient recently underwent a bilateral upper extremity venogram which show that she has adequate access for right hero graft creation.  The patient recently had her PermCath replaced and is functioning fine.  The patient denies any current issues with it during dialysis..  The patient denies amaurosis fugax or recent TIA symptoms. There are no recent neurological changes noted. The patient denies claudication symptoms or rest pain symptoms. The patient denies history of DVT, PE or superficial thrombophlebitis. The patient denies recent episodes of angina or shortness of breath.    Review of Systems  Neurological: Positive for weakness.       Objective:   Physical Exam Vitals reviewed.  HENT:     Head: Normocephalic.  Cardiovascular:     Rate and Rhythm: Normal rate and regular rhythm.     Pulses: Normal pulses.          Radial pulses are 2+ on the right side and 2+ on the left side.     Heart sounds: Normal heart sounds.     Comments: PermCath right chest Pulmonary:     Effort: Pulmonary effort is normal.     Breath sounds: Normal breath sounds.  Neurological:     Mental Status: She is alert and oriented to person, place, and time.     Motor: Weakness present.     Gait: Gait abnormal.  Psychiatric:        Mood and Affect: Mood normal.        Behavior: Behavior normal.        Thought Content: Thought content normal.        Judgment: Judgment normal.     BP (!) 184/76 (BP Location: Right Arm)   Pulse 64   Resp 16   Wt 181 lb 3.2 oz (82.2 kg)   BMI 30.15 kg/m   Past Medical History:  Diagnosis Date  . Anemia   . Anginal pain ([place])   . Anxiety   . Arthritis    . Asthma   . Broken wrist   . Bronchitis   . chronic diastolic CHF 8/50/2774  . Chronic kidney disease   . COPD (chronic obstructive pulmonary disease) ([place])   . Coronary artery disease    a. cath 2013: stenting to RCA (report not available); b. cath 2014: LM nl, pLAD 40%, mLAD nl, ost LCx 40%, mid LCx nl, pRCA 30% @ site of prior stent, mRCA 50%  . Depression   . Diabetes mellitus ([place])   . Diabetes mellitus without complication ([place])   . Diabetic neuropathy ([place])   . dialysis 2006  . Diverticulosis   . Dizziness   . Dyspnea   . Elevated lipids   . Environmental and seasonal allergies   . ESRD (end stage renal disease) on dialysis ([place])    M-W-F  . Gastroparesis   . GERD (gastroesophageal reflux disease)   . Headache   . History of anemia due to chronic kidney disease   . History of hiatal hernia   . HOH (hard of hearing)   . Hx of pancreatitis 2015  . Hypertension   . Lower extremity  edema   . Mitral regurgitation    a. echo 10/2013: EF 62%, noWMA, mildly dilated LA, mild to mod MR/TR, GR1DD  . Myocardial infarction ([place])   . Orthopnea   . Parathyroid abnormality ([place])   . Peripheral arterial disease ([place])   . Pneumonia   . Renal cancer ([place])   . Renal insufficiency    Pt is on dialysis on M,W + F.  . Wheezing     Social History   Socioeconomic History  . Marital status: Divorced    Spouse name: Not on file  . Number of children: Not on file  . Years of education: Not on file  . Highest education level: Not on file  Occupational History  . Not on file  Tobacco Use  . Smoking status: Never Smoker  . Smokeless tobacco: Never Used  Substance and Sexual Activity  . Alcohol use: Not Currently    Comment: glass wine week per pt  . Drug use: Yes    Types: Marijuana    Comment: once a day  . Sexual activity: Never  Other Topics Concern  . Not on file  Social History Narrative   ** Merged History Encounter **       Social Determinants of Health    Financial Resource Strain:   . Difficulty of Paying Living Expenses:   Food Insecurity:   . Worried About Charity fundraiser in the Last Year:   . Arboriculturist in the Last Year:   Transportation Needs:   . Film/video editor (Medical):   Marland Kitchen Lack of Transportation (Non-Medical):   Physical Activity:   . Days of Exercise per Week:   . Minutes of Exercise per Session:   Stress:   . Feeling of Stress :   Social Connections:   . Frequency of Communication with Friends and Family:   . Frequency of Social Gatherings with Friends and Family:   . Attends Religious Services:   . Active Member of Clubs or Organizations:   . Attends Archivist Meetings:   Marland Kitchen Marital Status:   Intimate Partner Violence:   . Fear of Current or Ex-Partner:   . Emotionally Abused:   Marland Kitchen Physically Abused:   . Sexually Abused:     Past Surgical History:  Procedure Laterality Date  . A/V SHUNTOGRAM Left 01/20/2018   Procedure: A/V SHUNTOGRAM;  Surgeon: Algernon Huxley, MD;  Location: [place] [facility];  Service: Cardiovascular;  Laterality: Left;  . ABDOMINAL HYSTERECTOMY  1992  . AMPUTATION TOE Left 10/02/2017   Procedure: AMPUTATION TOE-LEFT GREAT TOE;  Surgeon: Albertine Patricia, DPM;  Location: [facility];  Service: Podiatry;  Laterality: Left;  . APPENDECTOMY    . APPLICATION OF WOUND VAC N/A 11/25/2019   Procedure: APPLICATION OF WOUND VAC;  Surgeon: Katha Cabal, MD;  Location: [facility];  Service: Vascular;  Laterality: N/A;  . ARTERY BIOPSY Right 10/11/2018   Procedure: BIOPSY TEMPORAL ARTERY;  Surgeon: Vickie Epley, MD;  Location: [facility];  Service: General;  Laterality: Right;  . CARDIAC CATHETERIZATION Left 07/26/2015   Procedure: Left Heart Cath and Coronary Angiography;  Surgeon: Dionisio David, MD;  Location: [place] [facility];  Service: Cardiovascular;  Laterality: Left;  . CATARACT EXTRACTION W/ INTRAOCULAR LENS IMPLANT Right   . CATARACT EXTRACTION W/PHACO Left  03/10/2017   Procedure: CATARACT EXTRACTION PHACO AND INTRAOCULAR LENS PLACEMENT (IOC);  Surgeon: Birder Robson, MD;  Location: [facility];  Service: Ophthalmology;  Laterality: Left;  [place] 00:51.9 AP% 14.2 CDE 7.39 fluid pack lot # [id number]  . CENTRAL LINE INSERTION Right 11/11/2019   Procedure: CENTRAL LINE INSERTION;  Surgeon: Katha Cabal, MD;  Location: [facility];  Service: Vascular;  Laterality: Right;  . CENTRAL LINE INSERTION  11/25/2019   Procedure: CENTRAL LINE INSERTION;  Surgeon: Katha Cabal, MD;  Location: [facility];  Service: Vascular;;  . CHOLECYSTECTOMY    . COLONOSCOPY WITH PROPOFOL N/A 08/12/2016   Procedure: COLONOSCOPY WITH PROPOFOL;  Surgeon: Lollie Sails, MD;  Location: [facility];  Service: Endoscopy;  Laterality: N/A;  . DIALYSIS FISTULA CREATION Left    upper arm  . dialysis grafts    . DIALYSIS/PERMA CATHETER INSERTION N/A 11/14/2019   Procedure: DIALYSIS/PERMA CATHETER INSERTION;  Surgeon: Algernon Huxley, MD;  Location: [place] [facility];  Service: Cardiovascular;  Laterality: N/A;  . DIALYSIS/PERMA CATHETER INSERTION N/A 02/03/2020   Procedure: DIALYSIS/PERMA CATHETER INSERTION;  Surgeon: Katha Cabal, MD;  Location: [place] [facility];  Service: Cardiovascular;  Laterality: N/A;  . ESOPHAGOGASTRODUODENOSCOPY N/A 03/08/2015   Procedure: ESOPHAGOGASTRODUODENOSCOPY (EGD);  Surgeon: Manya Silvas, MD;  Location[facility];  Service: Endoscopy;  Laterality: N/A;  . ESOPHAGOGASTRODUODENOSCOPY (EGD) WITH PROPOFOL N/A 03/18/2016   Procedure: ESOPHAGOGASTRODUODENOSCOPY (EGD) WITH PROPOFOL;  Surgeon: [name], MD;  Location: [facility];  Service: Endoscopy;  Laterality: N/A;  . EYE SURGERY Right 2018  . FECAL TRANSPLANT N/A 08/23/2015   Procedure: FECAL TRANSPLANT;  Surgeon: Manya Silvas, MD;  Location: [facility]OSCOPY;  Service: Endoscopy;  Laterality: N/A;  . HAND SURGERY Bilateral   . HEMATOMA EVACUATION Left 11/25/2019    Procedure: EVACUATION HEMATOMA;  Surgeon: Katha Cabal, MD;  Location: [facility];  Service: Vascular;  Laterality: Left;  . I & D EXTREMITY Left 11/25/2019   Procedure: IRRIGATION AND DEBRIDEMENT EXTREMITY;  Surgeon: Katha Cabal, MD;  Location: [facility];  Service: Vascular;  Laterality: Left;  . IR RADIOLOGIST EVAL & MGMT  07/28/2019  . IR RADIOLOGIST EVAL & MGMT  08/11/2019  . LIGATION OF ARTERIOVENOUS  FISTULA Left 11/11/2019  . LIGATION OF ARTERIOVENOUS  FISTULA Left 11/11/2019   Procedure: LIGATION OF ARTERIOVENOUS  FISTULA;  Surgeon: Katha Cabal, MD;  Location: [facility];  Service: Vascular;  Laterality: Left;  . PERIPHERAL VASCULAR CATHETERIZATION N/A 12/20/2015   Procedure: Thrombectomy of dialysis access versus permcath placement;  Surgeon: Algernon Huxley, MD;  Location: [place] [facility];  Service: Cardiovascular;  Laterality: N/A;  . PERIPHERAL VASCULAR CATHETERIZATION N/A 12/20/2015   Procedure: A/V Shunt Intervention;  Surgeon: Algernon Huxley, MD;  Location: [place] [facility];  Service: Cardiovascular;  Laterality: N/A;  . PERIPHERAL VASCULAR CATHETERIZATION N/A 12/20/2015   Procedure: A/V Shuntogram/Fistulagram;  Surgeon: Algernon Huxley, MD;  Location: [place] [facility];  Service: Cardiovascular;  Laterality: N/A;  . PERIPHERAL VASCULAR CATHETERIZATION N/A 01/02/2016   Procedure: A/V Shuntogram/Fistulagram;  Surgeon: Algernon Huxley, MD;  Location: [place] [facility];  Service: Cardiovascular;  Laterality: N/A;  . PERIPHERAL VASCULAR CATHETERIZATION N/A 01/02/2016   Procedure: A/V Shunt Intervention;  Surgeon: Algernon Huxley, MD;  Location: [place] [facility];  Service: Cardiovascular;  Laterality: N/A;  . UPPER EXTREMITY VENOGRAPHY Right 01/18/2020   Procedure: UPPER EXTREMITY VENOGRAPHY;  Surgeon: Katha Cabal, MD;  Location: [place] [facility];  Service: Cardiovascular;  Laterality: Right;    Family History  Problem Relation Age of Onset  . Kidney disease Mother    .  Diabetes Mother   . Cancer Father   . Kidney disease Sister     Allergies  Allergen Reactions  . Ace Inhibitors Swelling and Anaphylaxis  . Ativan [Lorazepam] Other (See Comments)    Reaction:Hallucinations and headaches  . Compazine [Prochlorperazine Edisylate] Anaphylaxis, Nausea And Vomiting and Other (See Comments)    Other reaction(s): dystonia from this vs. Reglan, 23 Jul - patient relates that she takes promethazine frequently with no problems  . Sumatriptan Succinate Other (See Comments)    Other reaction(s): delirium and hallucinations per [facility] records  . Zofran [Ondansetron Hcl] Other (See Comments)    Reaction:  hallucinations   . Zofran [Ondansetron]     Per pt. she is allergic to zofran or will experience adverse reaction like hallucination   . [place] Nausea And Vomiting  . Losartan Nausea Only  . Prochlorperazine Other (See Comments)    Reaction:  Unknown . Patient does not remember reaction but she does have vertigo and anxiety along with n and v at times. Could be used to treat any of these   . Reglan [Metoclopramide] Other (See Comments)    Per patient her Dr. Evelina Bucy her off it   . Scopolamine Other (See Comments)    Dizziness, also has vertigo already  . Tape Rash    Plastic tape causes rash  . Tapentadol Rash       Assessment & Plan:   1. ESRD on hemodialysis ([place]) Recommend:  At this time the patient does not have appropriate extremity access for dialysis  Patient should have a right Hero Graft created.  The risks, benefits and alternative therapies were reviewed in detail with the patient.  All questions were answered.  The patient agrees to proceed with surgery.    2. Hematoma of arm, left, subsequent encounter This was resolved and left upper extremity is doing well.  3. Essential hypertension Continue antihypertensive medications as already ordered, these medications have been reviewed and there are no changes at this time.   4.  Type 2 diabetes mellitus with other circulatory complication, unspecified whether long term insulin use (HCC) Continue hypoglycemic medications as already ordered, these medications have been reviewed and there are no changes at this time.  Hgb A1C to be monitored as already arranged by primary service    Current Outpatient Medications on File Prior to Visit  Medication Sig Dispense Refill  . albuterol (PROVENTIL) (2.5 MG/3ML) 0.083% nebulizer solution Take 2.5 mg by nebulization every 4 (four) hours as needed for wheezing or shortness of breath.    Marland Kitchen albuterol (VENTOLIN HFA) 108 (90 Base) MCG/ACT inhaler Inhale 2 puffs into the lungs every 4 (four) hours as needed for wheezing or shortness of breath.    Marland Kitchen amLODipine (NORVASC) 10 MG tablet Take 10 mg by mouth daily.     Marland Kitchen aspirin EC 81 MG tablet Take 81 mg by mouth daily.    Marland Kitchen atorvastatin (LIPITOR) 20 MG tablet Take 20 mg by mouth daily.    . carvedilol (COREG) 6.25 MG tablet Take 6.25 mg by mouth 2 (two) times daily.    . Cholecalciferol (VITAMIN D3) 25 MCG (1000 UT) CAPS Take 1 capsule by mouth daily.    . cinacalcet (SENSIPAR) 30 MG tablet Take 30 mg by mouth daily.    . citalopram (CELEXA) 20 MG tablet Take 20 mg by mouth daily.    . clopidogrel (PLAVIX) 75 MG tablet Take 75 mg by mouth daily.    . COMBIVENT RESPIMAT 20-100  MCG/ACT AERS respimat Inhale 2 puffs into the lungs every 6 (six) hours as needed for wheezing or shortness of breath.     . cyanocobalamin 1000 MCG tablet Take 1,000 mcg by mouth daily.     . Ferrous Sulfate (IRON) 325 (65 Fe) MG TABS Take 1 tablet by mouth daily.    . fluticasone (FLONASE) 50 MCG/ACT nasal spray Place 2 sprays into both nostrils daily.    Marland Kitchen gabapentin (NEURONTIN) 100 MG capsule Take 100-200 mg by mouth See admin instructions. Take one capsule (100 mg) twice daily at 0800 and 1430 and two capsules (200 mg) at bedtime    . hydrALAZINE (APRESOLINE) 10 MG tablet Take 10 mg by mouth 3 (three) times  daily.    Marland Kitchen HYDROcodone-acetaminophen (NORCO/VICODIN) 5-325 MG tablet Take 1 tablet by mouth 4 (four) times daily as needed for pain.    . hydrocortisone cream 0.5 % Apply topically 3 (three) times daily. 30 g 0  . hydrOXYzine (ATARAX/VISTARIL) 25 MG tablet Take 25 mg by mouth daily. On tues, thurs, and sat    . insulin aspart (NOVOLOG) 100 UNIT/ML injection Inject 4-15 Units into the skin in the morning, at noon, in the evening, and at bedtime.    . meclizine (ANTIVERT) 25 MG tablet Take 12.5 mg by mouth every 6 (six) hours.     . multivitamin (RENA-VIT) TABS tablet Take 1 tablet by mouth daily.     . mupirocin ointment (BACTROBAN) 2 % Apply 1 application topically 3 (three) times daily.    . nitroGLYCERIN (NITROSTAT) 0.4 MG SL tablet Place 0.4 mg under the tongue every 5 (five) minutes as needed for chest pain.     . Omega-3 300 MG CAPS Take 1 capsule by mouth 2 (two) times daily.    . pantoprazole (PROTONIX) 40 MG tablet Take 40 mg by mouth daily.    . Probiotic Product (PROBIOTIC PO) Take 1 capsule by mouth daily.    . sevelamer carbonate (RENVELA) 800 MG tablet Take 800-2,400 mg by mouth See admin instructions. Take 2400 mg by mouth 3 times daily with meals and take 800 mg by mouth with snacks    . Turmeric (QC TUMERIC COMPLEX PO) Take 250 mg by mouth 1 day or 1 dose.     Marland Kitchen Zinc 50 MG CAPS Take 1 capsule by mouth daily.    . cholecalciferol (VITAMIN D) 400 units TABS tablet Take 400 Units by mouth daily.    Marland Kitchen CREON 12000 units CPEP capsule Take 12,000 Units by mouth 3 (three) times daily before meals. And twice a day with snacks    . diclofenac sodium (VOLTAREN) 1 % GEL Apply 1 application topically daily as needed (for rash).    . promethazine (PHENERGAN) 12.5 MG tablet Take 1 tablet (12.5 mg total) by mouth every 8 (eight) hours as needed for nausea. (Patient not taking: Reported on 01/18/2020) 30 tablet 0   No current facility-administered medications on file prior to visit.    There  are no Patient Instructions on file for this visit. No follow-ups on file.   Kris Hartmann, NP

## 2020-02-29 ENCOUNTER — Ambulatory Visit: Payer: Medicare Other | Admitting: Urology

## 2020-03-07 ENCOUNTER — Ambulatory Visit: Payer: Medicare Other | Admitting: Urology

## 2020-03-07 ENCOUNTER — Telehealth (INDEPENDENT_AMBULATORY_CARE_PROVIDER_SITE_OTHER): Payer: Self-pay

## 2020-03-07 NOTE — Telephone Encounter (Signed)
Spoke with the patient regarding surgery on 03/06/20, patient was given the surgery day of 03/30/20 for her Right HERO Graft surgery. Patient will do a phone call pre-op on 03/21/20 between 1-5 pm and covid testing on 03/28/20 between 8-1 pm at the [place]. Pre-surgical instructions were discussed and will be mailed.

## 2020-03-08 ENCOUNTER — Encounter (INDEPENDENT_AMBULATORY_CARE_PROVIDER_SITE_OTHER): Payer: Self-pay

## 2020-03-12 ENCOUNTER — Other Ambulatory Visit: Payer: Self-pay

## 2020-03-12 ENCOUNTER — Inpatient Hospital Stay: Payer: Medicare Other | Attending: Oncology | Admitting: Oncology

## 2020-03-12 ENCOUNTER — Encounter: Payer: Self-pay | Admitting: Oncology

## 2020-03-12 VITALS — BP 186/76 | HR 64 | Temp 97.3°F | Resp 16 | Wt 182.9 lb

## 2020-03-12 DIAGNOSIS — E1122 Type 2 diabetes mellitus with diabetic chronic kidney disease: Secondary | ICD-10-CM | POA: Insufficient documentation

## 2020-03-12 DIAGNOSIS — D472 Monoclonal gammopathy: Secondary | ICD-10-CM | POA: Insufficient documentation

## 2020-03-12 DIAGNOSIS — N186 End stage renal disease: Secondary | ICD-10-CM | POA: Diagnosis not present

## 2020-03-12 DIAGNOSIS — I129 Hypertensive chronic kidney disease with stage 1 through stage 4 chronic kidney disease, or unspecified chronic kidney disease: Secondary | ICD-10-CM | POA: Diagnosis not present

## 2020-03-12 DIAGNOSIS — R5383 Other fatigue: Secondary | ICD-10-CM | POA: Diagnosis not present

## 2020-03-12 DIAGNOSIS — Z992 Dependence on renal dialysis: Secondary | ICD-10-CM | POA: Diagnosis not present

## 2020-03-12 DIAGNOSIS — Z79899 Other long term (current) drug therapy: Secondary | ICD-10-CM | POA: Diagnosis not present

## 2020-03-12 DIAGNOSIS — E785 Hyperlipidemia, unspecified: Secondary | ICD-10-CM | POA: Diagnosis not present

## 2020-03-12 DIAGNOSIS — N189 Chronic kidney disease, unspecified: Secondary | ICD-10-CM | POA: Diagnosis not present

## 2020-03-12 DIAGNOSIS — M8580 Other specified disorders of bone density and structure, unspecified site: Secondary | ICD-10-CM | POA: Insufficient documentation

## 2020-03-12 DIAGNOSIS — K219 Gastro-esophageal reflux disease without esophagitis: Secondary | ICD-10-CM | POA: Insufficient documentation

## 2020-03-12 DIAGNOSIS — E1142 Type 2 diabetes mellitus with diabetic polyneuropathy: Secondary | ICD-10-CM | POA: Diagnosis not present

## 2020-03-12 DIAGNOSIS — J449 Chronic obstructive pulmonary disease, unspecified: Secondary | ICD-10-CM | POA: Diagnosis not present

## 2020-03-12 DIAGNOSIS — R5381 Other malaise: Secondary | ICD-10-CM | POA: Insufficient documentation

## 2020-03-12 DIAGNOSIS — D631 Anemia in chronic kidney disease: Secondary | ICD-10-CM

## 2020-03-12 DIAGNOSIS — M1990 Unspecified osteoarthritis, unspecified site: Secondary | ICD-10-CM | POA: Insufficient documentation

## 2020-03-12 NOTE — Progress Notes (Signed)
Pt says her left hip has started bothering her over last 2 weeks, she has appt with PCP 6/24. She did get a clot in left arm where she had catheter for dialysis and it had to be removed, b/p elevated tdy

## 2020-03-14 ENCOUNTER — Ambulatory Visit: Payer: Medicare Other | Admitting: Urology

## 2020-03-15 NOTE — Progress Notes (Signed)
Hematology/Oncology Consult note [facility]  [phone] [phone]  Patient Care Team: Center, [facility] as PCP - General (General Practice) Lavonia Dana, MD as Consulting Physician (Internal Medicine) Freddy Finner, NP (Nurse Practitioner)   Name of the patient: [name]  [id number]  [birth year]   Date of visit: 03/15/20  Diagnosis- anemia of chronic kidney disease MGUS  Chief complaint/ Reason for visit- routine f/u of anemia  Heme/Onc history: patient is a 59 year-old female with a past medical history significant for coronary artery disease, type 2 diabetes with neuropathy, hypertension hyperlipidemia and osteopenia arthritis. She has ESRD secondary to HTN/DM and has been on dialysis for about 14 years. She was seen by rheumatology for complaints of low back pain. She also had painful subcutaneous skin rashes and was seen by dermatology and biopsy of one of the lesions showed mild septal panicky like this. As a part of workup by rheumatology doctor Dr. Lezlie Lye- Meda Coffee she had SPEP done which came back abnormal with the presence of M spike and hence has been referred to [place]. There was still some ongoing concern about possible lupus and she follows up with rheumatology. Her last colonoscopy was in November 2017 which showed tubular adenoma and a repeat colonoscopy was recommended in 5 years.  CBC from 01/08/2017 showed white count of 6.7, H&H of 13.9/42.8 and a platelet count of 161. CMP showed elevated BUN of 43 and creatinine of 5.22. Calcium was normal at 9.7. Total protein was mildly elevated at 8.6. An albumin was normal at 3.9. SPEP from 11/06/2016 did not reveal any M spike. M spike of 4.2% was noted on 24-hour urine protein.A lambda free light chain ratio was normal although both And lambda were elevated due to chronic kidney disease.she gets epo through dialysis. She also has kidney mass suspicious for RCC being  followed by Dr. Erlene Quan   Interval history- left arm dialysis catheter was taken out after a clot. She now has a chest wall vasc cath in place  Reports left hip pain since 2 weeks and will be talking to her pcp  ECOG PS-2 Pain scale-3  Review of systems- Review of Systems  Constitutional: Positive for malaise/fatigue. Negative for chills, fever and weight loss.  HENT: Negative for congestion, ear discharge and nosebleeds.   Eyes: Negative for blurred vision.  Respiratory: Negative for cough, hemoptysis, sputum production, shortness of breath and wheezing.   Cardiovascular: Negative for chest pain, palpitations, orthopnea and claudication.  Gastrointestinal: Negative for abdominal pain, blood in stool, constipation, diarrhea, heartburn, melena, nausea and vomiting.  Genitourinary: Negative for dysuria, flank pain, frequency, hematuria and urgency.  Musculoskeletal: Negative for back pain, joint pain and myalgias.  Skin: Negative for rash.  Neurological: Negative for dizziness, tingling, focal weakness, seizures, weakness and headaches.  Endo/Heme/Allergies: Does not bruise/bleed easily.  Psychiatric/Behavioral: Negative for depression and suicidal ideas. The patient does not have insomnia.        Allergies  Allergen Reactions  . Ace Inhibitors Swelling and Anaphylaxis  . Ativan [Lorazepam] Other (See Comments)    Reaction:Hallucinations and headaches  . Compazine [Prochlorperazine Edisylate] Anaphylaxis, Nausea And Vomiting and Other (See Comments)    Other reaction(s): dystonia from this vs. Reglan, 23 Jul - patient relates that she takes promethazine frequently with no problems  . Sumatriptan Succinate Other (See Comments)    Other reaction(s): delirium and hallucinations per [facility] records  . Zofran [Ondansetron] Nausea And Vomiting    Per  pt. she is allergic to zofran or will experience adverse reaction like hallucination   . Lac Bovis Nausea And Vomiting  . Losartan Nausea  Only  . Prochlorperazine Other (See Comments)    Reaction:  Unknown . Patient does not remember reaction but she does have vertigo and anxiety along with n and v at times. Could be used to treat any of these   . Reglan [Metoclopramide] Other (See Comments)    Per patient her Dr. Evelina Bucy her off it   . Scopolamine Other (See Comments)    Dizziness, also has vertigo already  . Tape Rash    Plastic tape causes rash  . Tapentadol Rash     Past Medical History:  Diagnosis Date  . Anemia   . Anginal pain ([place])   . Anxiety   . Arthritis   . Asthma   . Broken wrist   . Bronchitis   . chronic diastolic CHF 1/47/8295  . Chronic kidney disease   . COPD (chronic obstructive pulmonary disease) ([place])   . Coronary artery disease    a. cath 2013: stenting to RCA (report not available); b. cath 2014: LM nl, pLAD 40%, mLAD nl, ost LCx 40%, mid LCx nl, pRCA 30% @ site of prior stent, mRCA 50%  . Depression   . Diabetes mellitus ([place])   . Diabetes mellitus without complication ([place])   . Diabetic neuropathy ([place])   . dialysis 2006  . Diverticulosis   . Dizziness   . Dyspnea   . Elevated lipids   . Environmental and seasonal allergies   . ESRD (end stage renal disease) on dialysis ([place])    M-W-F  . Gastroparesis   . GERD (gastroesophageal reflux disease)   . Headache   . History of anemia due to chronic kidney disease   . History of hiatal hernia   . HOH (hard of hearing)   . Hx of pancreatitis 2015  . Hypertension   . Lower extremity edema   . Mitral regurgitation    a. echo 10/2013: EF 62%, noWMA, mildly dilated LA, mild to mod MR/TR, GR1DD  . Myocardial infarction ([place])   . Orthopnea   . Parathyroid abnormality ([place])   . Peripheral arterial disease ([place])   . Pneumonia   . Renal cancer ([place])   . Renal insufficiency    Pt is on dialysis on M,W + F.  . Wheezing      Past Surgical History:  Procedure Laterality Date  . A/V SHUNTOGRAM Left 01/20/2018   Procedure: A/V SHUNTOGRAM;   Surgeon: Algernon Huxley, MD;  Location: [place] [facility];  Service: Cardiovascular;  Laterality: Left;  . ABDOMINAL HYSTERECTOMY  1992  . AMPUTATION TOE Left 10/02/2017   Procedure: AMPUTATION TOE-LEFT GREAT TOE;  Surgeon: Albertine Patricia, DPM;  Location: [facility];  Service: Podiatry;  Laterality: Left;  . APPENDECTOMY    . APPLICATION OF WOUND VAC N/A 11/25/2019   Procedure: APPLICATION OF WOUND VAC;  Surgeon: Katha Cabal, MD;  Location: [facility];  Service: Vascular;  Laterality: N/A;  . ARTERY BIOPSY Right 10/11/2018   Procedure: BIOPSY TEMPORAL ARTERY;  Surgeon: Vickie Epley, MD;  Location: [facility];  Service: General;  Laterality: Right;  . CARDIAC CATHETERIZATION Left 07/26/2015   Procedure: Left Heart Cath and Coronary Angiography;  Surgeon: Dionisio David, MD;  Location: [place] [facility];  Service: Cardiovascular;  Laterality: Left;  . CATARACT EXTRACTION W/ INTRAOCULAR LENS IMPLANT Right   . CATARACT EXTRACTION  W/PHACO Left 03/10/2017   Procedure: CATARACT EXTRACTION PHACO AND INTRAOCULAR LENS PLACEMENT (IOC);  Surgeon: Birder Robson, MD;  Location: [facility];  Service: Ophthalmology;  Laterality: Left;  [place] 00:51.9 AP% 14.2 CDE 7.39 fluid pack lot # [id number]  . CENTRAL LINE INSERTION Right 11/11/2019   Procedure: CENTRAL LINE INSERTION;  Surgeon: Katha Cabal, MD;  Location: [facility];  Service: Vascular;  Laterality: Right;  . CENTRAL LINE INSERTION  11/25/2019   Procedure: CENTRAL LINE INSERTION;  Surgeon: Katha Cabal, MD;  Location: [facility];  Service: Vascular;;  . CHOLECYSTECTOMY    . COLONOSCOPY WITH PROPOFOL N/A 08/12/2016   Procedure: COLONOSCOPY WITH PROPOFOL;  Surgeon: Lollie Sails, MD;  Location: [facility] ENDOSCOPY;  Service: Endoscopy;  Laterality: N/A;  . DIALYSIS FISTULA CREATION Left    upper arm  . dialysis grafts    . DIALYSIS/PERMA CATHETER INSERTION N/A 11/14/2019   Procedure: DIALYSIS/PERMA CATHETER INSERTION;  Surgeon: Algernon Huxley,  MD;  Location: [place] [facility];  Service: Cardiovascular;  Laterality: N/A;  . DIALYSIS/PERMA CATHETER INSERTION N/A 02/03/2020   Procedure: DIALYSIS/PERMA CATHETER INSERTION;  Surgeon: Katha Cabal, MD;  Location: [place] [facility];  Service: Cardiovascular;  Laterality: N/A;  . ESOPHAGOGASTRODUODENOSCOPY N/A 03/08/2015   Procedure: ESOPHAGOGASTRODUODENOSCOPY (EGD);  Surgeon: Manya Silvas, MD;  Location: [facility] ENDOSCOPY;  Service: Endoscopy;  Laterality: N/A;  . ESOPHAGOGASTRODUODENOSCOPY (EGD) WITH PROPOFOL N/A 03/18/2016   Procedure: ESOPHAGOGASTRODUODENOSCOPY (EGD) WITH PROPOFOL;  Surgeon: [name], MD;  Location: [facility] ENDOSCOPY;  Service: Endoscopy;  Laterality: N/A;  . EYE SURGERY Right 2018  . FECAL TRANSPLANT N/A 08/23/2015   Procedure: FECAL TRANSPLANT;  Surgeon: Manya Silvas, MD;  Location: [facility] ENDOSCOPY;  Service: Endoscopy;  Laterality: N/A;  . HAND SURGERY Bilateral   . HEMATOMA EVACUATION Left 11/25/2019   Procedure: EVACUATION HEMATOMA;  Surgeon: Katha Cabal, MD;  Location: [facility];  Service: Vascular;  Laterality: Left;  . I & D EXTREMITY Left 11/25/2019   Procedure: IRRIGATION AND DEBRIDEMENT EXTREMITY;  Surgeon: Katha Cabal, MD;  Location: [facility];  Service: Vascular;  Laterality: Left;  . IR RADIOLOGIST EVAL & MGMT  07/28/2019  . IR RADIOLOGIST EVAL & MGMT  08/11/2019  . LIGATION OF ARTERIOVENOUS  FISTULA Left 11/11/2019  . LIGATION OF ARTERIOVENOUS  FISTULA Left 11/11/2019   Procedure: LIGATION OF ARTERIOVENOUS  FISTULA;  Surgeon: Katha Cabal, MD;  Location: [facility];  Service: Vascular;  Laterality: Left;  . PERIPHERAL VASCULAR CATHETERIZATION N/A 12/20/2015   Procedure: Thrombectomy of dialysis access versus permcath placement;  Surgeon: Algernon Huxley, MD;  Location: [place] [facility];  Service: Cardiovascular;  Laterality: N/A;  . PERIPHERAL VASCULAR CATHETERIZATION N/A 12/20/2015   Procedure: A/V Shunt Intervention;  Surgeon: Algernon Huxley, MD;  Location: [place] [facility];  Service: Cardiovascular;  Laterality: N/A;  . PERIPHERAL VASCULAR CATHETERIZATION N/A 12/20/2015   Procedure: A/V Shuntogram/Fistulagram;  Surgeon: Algernon Huxley, MD;  Location: [place] [facility];  Service: Cardiovascular;  Laterality: N/A;  . PERIPHERAL VASCULAR CATHETERIZATION N/A 01/02/2016   Procedure: A/V Shuntogram/Fistulagram;  Surgeon: Algernon Huxley, MD;  Location: [place] [facility];  Service: Cardiovascular;  Laterality: N/A;  . PERIPHERAL VASCULAR CATHETERIZATION N/A 01/02/2016   Procedure: A/V Shunt Intervention;  Surgeon: Algernon Huxley, MD;  Location: [place] [facility];  Service: Cardiovascular;  Laterality: N/A;  . UPPER EXTREMITY VENOGRAPHY Right 01/18/2020   Procedure: UPPER EXTREMITY VENOGRAPHY;  Surgeon: Katha Cabal, MD;  Location: [place] [facility];  Service: Cardiovascular;  Laterality: Right;    Social History   Socioeconomic History  . Marital status: Divorced    Spouse name: Not on file  . Number of children: Not on file  . Years of education: Not on file  . Highest education level: Not on file  Occupational History  . Not on file  Tobacco Use  . Smoking status: Never Smoker  . Smokeless tobacco: Never Used  Vaping Use  . Vaping Use: Never used  Substance and Sexual Activity  . Alcohol use: Not Currently    Comment: glass wine week per pt  . Drug use: Yes    Types: Marijuana    Comment: once a day  . Sexual activity: Never  Other Topics Concern  . Not on file  Social History Narrative   ** Merged History Encounter **       Social Determinants of Health   Financial Resource Strain:   . Difficulty of Paying Living Expenses:   Food Insecurity:   . Worried About Charity fundraiser in the Last Year:   . Arboriculturist in the Last Year:   Transportation Needs:   . Film/video editor (Medical):   Marland Kitchen Lack of Transportation (Non-Medical):   Physical Activity:   . Days of Exercise per Week:   .  Minutes of Exercise per Session:   Stress:   . Feeling of Stress :   Social Connections:   . Frequency of Communication with Friends and Family:   . Frequency of Social Gatherings with Friends and Family:   . Attends Religious Services:   . Active Member of Clubs or Organizations:   . Attends Archivist Meetings:   Marland Kitchen Marital Status:   Intimate Partner Violence:   . Fear of Current or Ex-Partner:   . Emotionally Abused:   Marland Kitchen Physically Abused:   . Sexually Abused:     Family History  Problem Relation Age of Onset  . Kidney disease Mother   . Diabetes Mother   . Cancer Father   . Kidney disease Sister      Current Outpatient Medications:  .  albuterol (PROVENTIL) (2.5 MG/3ML) 0.083% nebulizer solution, Take 2.5 mg by nebulization every 4 (four) hours as needed for wheezing or shortness of breath., Disp: , Rfl:  .  albuterol (VENTOLIN HFA) 108 (90 Base) MCG/ACT inhaler, Inhale 2 puffs into the lungs every 4 (four) hours as needed for wheezing or shortness of breath., Disp: , Rfl:  .  amLODipine (NORVASC) 10 MG tablet, Take 10 mg by mouth daily. , Disp: , Rfl:  .  aspirin EC 81 MG tablet, Take 81 mg by mouth daily., Disp: , Rfl:  .  atorvastatin (LIPITOR) 20 MG tablet, Take 20 mg by mouth daily., Disp: , Rfl:  .  carvedilol (COREG) 6.25 MG tablet, Take 6.25 mg by mouth 2 (two) times daily., Disp: , Rfl:  .  Cholecalciferol (VITAMIN D3) 25 MCG (1000 UT) CAPS, Take 1,000 Units by mouth daily. , Disp: , Rfl:  .  cinacalcet (SENSIPAR) 30 MG tablet, Take 30 mg by mouth daily., Disp: , Rfl:  .  citalopram (CELEXA) 20 MG tablet, Take 20 mg by mouth daily., Disp: , Rfl:  .  clopidogrel (PLAVIX) 75 MG tablet, Take 75 mg by mouth at bedtime. , Disp: , Rfl:  .  CREON 12000 units CPEP capsule, Take 12,000 Units by mouth See admin instructions. Take 36,000 with  meals and 24,000 with snacks three times a day, Disp: , Rfl:  .  cyanocobalamin 1000 MCG tablet, Take 1,000 mcg by mouth daily.  , Disp: , Rfl:  .  diclofenac sodium (VOLTAREN) 1 % GEL, Apply 1 application topically in the morning and at bedtime. , Disp: , Rfl:  .  Ferrous Sulfate (IRON) 325 (65 Fe) MG TABS, Take 325 mg by mouth daily. , Disp: , Rfl:  .  gabapentin (NEURONTIN) 100 MG capsule, Take 100 mg by mouth 2 (two) times daily. , Disp: , Rfl:  .  HYDROcodone-acetaminophen (NORCO/VICODIN) 5-325 MG tablet, Take 1 tablet by mouth 4 (four) times daily as needed for moderate pain. , Disp: , Rfl:  .  hydrOXYzine (ATARAX/VISTARIL) 25 MG tablet, Take 25 mg by mouth See admin instructions. Take on Tues, Thurs, and sat after dialysis Monday Wednesday Friday and Sunday daily in the morning, Disp: , Rfl:  .  insulin aspart (NOVOLOG) 100 UNIT/ML injection, Inject 4-15 Units into the skin in the morning, at noon, in the evening, and at bedtime. Sliding scale, Disp: , Rfl:  .  meclizine (ANTIVERT) 25 MG tablet, Take 12.5 mg by mouth every 6 (six) hours. , Disp: , Rfl:  .  multivitamin (RENA-VIT) TABS tablet, Take 1 tablet by mouth daily. , Disp: , Rfl:  .  mupirocin ointment (BACTROBAN) 2 %, Apply 1 application topically daily as needed (leg rash). , Disp: , Rfl:  .  nitroGLYCERIN (NITROSTAT) 0.4 MG SL tablet, Place 0.4 mg under the tongue every 5 (five) minutes as needed for chest pain. , Disp: , Rfl:  .  Omega-3 300 MG CAPS, Take 300 mg by mouth 2 (two) times daily. Every other day opposite omega XL, Disp: , Rfl:  .  pantoprazole (PROTONIX) 40 MG tablet, Take 40 mg by mouth daily., Disp: , Rfl:  .  Probiotic Product (PROBIOTIC PO), Take 1 capsule by mouth daily., Disp: , Rfl:  .  promethazine (PHENERGAN) 12.5 MG tablet, Take 1 tablet (12.5 mg total) by mouth every 8 (eight) hours as needed for nausea., Disp: 30 tablet, Rfl: 0 .  sevelamer carbonate (RENVELA) 800 MG tablet, Take 800-2,400 mg by mouth See admin instructions. Take 2400 mg by mouth 3 times daily with meals and take [phone] mg by mouth with snacks, Disp: , Rfl:  .   Turmeric (QC TUMERIC COMPLEX PO), Take 250 mg by mouth daily. , Disp: , Rfl:  .  Zinc 50 MG CAPS, Take 50 mg by mouth daily. , Disp: , Rfl:  .  ammonium lactate (LAC-HYDRIN) 12 % lotion, Apply 1 application topically in the morning and at bedtime., Disp: , Rfl:  .  Ascorbic Acid (VITA-C PO), Take 1 tablet by mouth daily., Disp: , Rfl:  .  COMBIVENT RESPIMAT 20-100 MCG/ACT AERS respimat, Inhale 2 puffs into the lungs every 6 (six) hours as needed for wheezing or shortness of breath. Shortness of breath, Disp: , Rfl:  .  diphenhydrAMINE (BENADRYL) 25 MG tablet, Take 25 mg by mouth every 6 (six) hours as needed for itching., Disp: , Rfl:  .  hydrALAZINE (APRESOLINE) 10 MG tablet, Take 10 mg by mouth 3 (three) times daily. , Disp: , Rfl:  .  hydrocortisone cream 0.5 %, Apply topically 3 (three) times daily. (Patient not taking: Reported on 03/12/2020), Disp: 30 g, Rfl: 0 .  insulin detemir (LEVEMIR) 100 UNIT/ML injection, Inject 67 Units into the skin at bedtime., Disp: , Rfl:  .  OVER THE COUNTER  MEDICATION, Take 1 tablet by mouth in the morning and at bedtime. Omega XL/ every other day opposite omega 3, Disp: , Rfl:   Physical exam:  Vitals:   03/12/20 1041  BP: (!) 186/76  Pulse: 64  Resp: 16  Temp: (!) 97.3 F (36.3 C)  TempSrc: Tympanic  Weight: 182 lb 14.4 oz (83 kg)   Physical Exam Constitutional:      General: She is not in acute distress. Cardiovascular:     Rate and Rhythm: Normal rate and regular rhythm.     Heart sounds: Normal heart sounds.  Pulmonary:     Effort: Pulmonary effort is normal.     Breath sounds: Normal breath sounds.  Abdominal:     General: Bowel sounds are normal.     Palpations: Abdomen is soft.  Skin:    General: Skin is warm and dry.  Neurological:     Mental Status: She is alert and oriented to person, place, and time.      CMP Latest Ref Rng & Units 12/07/2019  Glucose 70 - 99 mg/dL 113(H)  BUN 6 - 20 mg/dL 10  Creatinine 0.44 - 1.00 mg/dL  4.21(H)  Sodium 135 - 145 mmol/L 126(L)  Potassium 3.5 - 5.1 mmol/L 4.3  Chloride 98 - 111 mmol/L 92(L)  CO2 22 - 32 mmol/L 26  Calcium 8.9 - 10.3 mg/dL 8.1(L)  Total Protein 6.5 - 8.1 g/dL 5.6(L)  Total Bilirubin 0.3 - 1.2 mg/dL 0.6  Alkaline Phos 38 - 126 U/L 78  AST 15 - 41 U/L 17  ALT 0 - 44 U/L 5   CBC Latest Ref Rng & Units 12/07/2019  WBC 4.0 - 10.5 K/uL 8.5  Hemoglobin 12.0 - 15.0 g/dL 8.3(L)  Hematocrit 36 - 46 % 25.6(L)  Platelets 150 - 400 K/uL 181    No images are attached to the encounter.  NM Myocar Multi W/Spect W/Wall Motion / EF  Result Date: 02/24/2020  The study is normal.  This is a low risk study.  The left ventricular ejection fraction is normal (55-65%).  There was no ST segment deviation noted during stress.  Negative lexiscan stress LV function normal Low risk study with no reversible ischemia noted.   ECHOCARDIOGRAM COMPLETE  Result Date: 02/24/2020    ECHOCARDIOGRAM REPORT   Patient Name:   SOPHONIE GOFORTH Date of Exam: 02/24/2020 Medical Rec #:  [id number]   Height:       65.0 in Accession #:    [id number]  Weight:       181.2 lb Date of Birth:  [birth year]    BSA:          1.897 m Patient Age:    60 years    BP:           184/76 mmHg Patient Gender: F           HR:           64 bpm. Exam Location:  [facility] Procedure: 2D Echo, Cardiac Doppler and Color Doppler Indications:     Dyspnea 786.09  History:         Patient has prior history of Echocardiogram examinations, most                  recent 11/11/2019. COPD, Signs/Symptoms:Dyspnea; Risk                  Factors:Diabetes. ESRD.  Sonographer:     Sherrie Sport RDCS (AE) Referring Phys:  [id number]  Corey Skains Diagnosing Phys: Bartholome Bill MD IMPRESSIONS  1. Left ventricular ejection fraction, by estimation, is 55 to 60%. Left ventricular ejection fraction by PLAX is 54 %. The left ventricle has normal function. The left ventricle has no regional wall motion abnormalities. There is mild left ventricular hypertrophy. Left  ventricular diastolic parameters were normal.  2. Right ventricular systolic function is normal. The right ventricular size is mildly enlarged. There is severely elevated pulmonary artery systolic pressure.  3. Left atrial size was mildly dilated.  4. The mitral valve is abnormal. Trivial mitral valve regurgitation.  5. Tricuspid valve regurgitation is mild to moderate.  6. The aortic valve is tricuspid. Aortic valve regurgitation is not visualized. FINDINGS  Left Ventricle: Left ventricular ejection fraction, by estimation, is 55 to 60%. Left ventricular ejection fraction by PLAX is 54 %. The left ventricle has normal function. The left ventricle has no regional wall motion abnormalities. The left ventricular internal cavity size was normal in size. There is mild left ventricular hypertrophy. Left ventricular diastolic parameters were normal. Right Ventricle: The right ventricular size is mildly enlarged. No increase in right ventricular wall thickness. Right ventricular systolic function is normal. There is severely elevated pulmonary artery systolic pressure. The tricuspid regurgitant velocity is 3.71 m/s, and with an assumed right atrial pressure of 10 mmHg, the estimated right ventricular systolic pressure is 07.3 mmHg. Left Atrium: Left atrial size was mildly dilated. Right Atrium: Right atrial size was normal in size. Pericardium: There is no evidence of pericardial effusion. Mitral Valve: The mitral valve is abnormal. There is mild prolapse of the medial segment of the anterior leaflet of the mitral valve. Trivial mitral valve regurgitation. Tricuspid Valve: The tricuspid valve is normal in structure. Tricuspid valve regurgitation is mild to moderate. Aortic Valve: The aortic valve is tricuspid. Aortic valve regurgitation is not visualized. Aortic valve mean gradient measures 3.5 mmHg. Aortic valve peak gradient measures 6.8 mmHg. Aortic valve area, by VTI measures 2.06 cm. Pulmonic Valve: The pulmonic  valve was not well visualized. Pulmonic valve regurgitation is not visualized. Aorta: The aortic root is normal in size and structure. IAS/Shunts: The interatrial septum was not assessed.  LEFT VENTRICLE PLAX 2D LV EF:         Left            Diastology                ventricular     LV e' lateral:   7.62 cm/s                ejection        LV E/e' lateral: 14.8                fraction by     LV e' medial:    5.55 cm/s                PLAX is 54      LV E/e' medial:  20.4                %. LVIDd:         4.79 cm LVIDs:         3.45 cm LV PW:         0.95 cm LV IVS:        0.97 cm LVOT diam:     2.00 cm LV SV:         72 LV SV Index:   38 LVOT  Area:     3.14 cm  RIGHT VENTRICLE RV Basal diam:  4.06 cm RV S prime:     11.00 cm/s TAPSE (M-mode): 3.7 cm LEFT ATRIUM            Index       RIGHT ATRIUM           Index LA diam:      3.90 cm  2.06 cm/m  RA Area:     18.90 cm LA Vol (A2C): 103.0 ml 54.30 ml/m RA Volume:   60.00 ml  31.63 ml/m LA Vol (A4C): 44.4 ml  23.41 ml/m  AORTIC VALVE                   PULMONIC VALVE AV Area (Vmax):    1.81 cm    PV Vmax:        0.72 m/s AV Area (Vmean):   2.00 cm    PV Peak grad:   2.1 mmHg AV Area (VTI):     2.06 cm    RVOT Peak grad: 4 mmHg AV Vmax:           130.50 cm/s AV Vmean:          88.500 cm/s AV VTI:            0.349 m AV Peak Grad:      6.8 mmHg AV Mean Grad:      3.5 mmHg LVOT Vmax:         75.10 cm/s LVOT Vmean:        56.400 cm/s LVOT VTI:          0.229 m LVOT/AV VTI ratio: 0.66  AORTA Ao Root diam: 2.60 cm MITRAL VALVE                TRICUSPID VALVE MV Area (PHT): 3.76 cm     TR Peak grad:   55.1 mmHg MV Decel Time: 202 msec     TR Vmax:        371.00 cm/s MV E velocity: 113.00 cm/s MV A velocity: 90.80 cm/s   SHUNTS MV E/A ratio:  1.24         Systemic VTI:  0.23 m                             Systemic Diam: 2.00 cm Bartholome Bill MD Electronically signed by Bartholome Bill MD Signature Date/Time: 02/24/2020/1:15:21 PM    Final      Assessment and plan- Patient  is a 59 y.o. female with following issues;  1. Anemia of CKD: last hemoglobin on 03/06/20 was 11. Iron studies in may 2021 were done and were acceptable.  she is a difficult blood draw and gets  Labs through dialysis  2. MGUS- unable to get blood draw at this time. Dialysis will not draw blood for [place]. Given her multiple comorbidities and stable hb, we will check that as able  I will see her in 6 months    Visit Diagnosis 1. MGUS (monoclonal gammopathy of unknown significance)   2. Anemia in chronic kidney disease, unspecified CKD stage      Dr. Randa Evens, MD, MPH [facility] [phone] 03/15/2020 11:58 AM

## 2020-03-15 NOTE — Progress Notes (Signed)
03/16/20 12:38 PM   [name] Feb 10, 1961 [id number]  Referring provider: Freddy Finner, NP [place],  [place] [address] Chief Complaint  Patient presents with  . Follow-up    Discuss MRI    HPI: [name] is a 59 y.o. F with multiple medical comorbidities including end-stage renal disease on dialysis, COPD and CHF is being followed for right renal mass x2.   CT scan from 12/07/2018 shows interval growth of the lesion since 2018, the right medial upper pole lesion now measures 14 x 13 x 12.5 mm, up from 11.5 x 10.5 x 9.5 in 2019. In addition, an enhancing upper mid pole posterior lesion now measures 12.0 x 10.5 mm x 15 mm, up from 11.5 x 10.0 x 13 mm.   Most recent MR Abdomen w/o cotnrast from 08/11/19 revealed known enhancing renal cortical masses in the medial upper right kidney and posterior upper right kidney are stable in size since 12/07/2018 CT. Separate known enhancing subcentimeter renal cortical mass in the anterior upper right kidney is occult on today's study.  No flank pain.  Voids variable amounts.  No voiding complaints.  Since last visit she is interested in ablative therapy however she has been in and out of hospital w/ various issues including AV fistula w/ complications. She is currently on dialysis.   She reports of increased frequency, urgency, and burning w/ urination onset recently.   PMH: Past Medical History:  Diagnosis Date  . Anemia   . Anginal pain ([place])   . Anxiety   . Arthritis   . Asthma   . Broken wrist   . Bronchitis   . chronic diastolic CHF 8/54/6270  . Chronic kidney disease   . COPD (chronic obstructive pulmonary disease) ([place])   . Coronary artery disease    a. cath 2013: stenting to RCA (report not available); b. cath 2014: LM nl, pLAD 40%, mLAD nl, ost LCx 40%, mid LCx nl, pRCA 30% @ site of prior stent, mRCA 50%  . Depression   . Diabetes mellitus ([place])   . Diabetes mellitus without complication ([place])   .  Diabetic neuropathy ([place])   . dialysis 2006  . Diverticulosis   . Dizziness   . Dyspnea   . Elevated lipids   . Environmental and seasonal allergies   . ESRD (end stage renal disease) on dialysis ([place])    M-W-F  . Gastroparesis   . GERD (gastroesophageal reflux disease)   . Headache   . History of anemia due to chronic kidney disease   . History of hiatal hernia   . HOH (hard of hearing)   . Hx of pancreatitis 2015  . Hypertension   . Lower extremity edema   . Mitral regurgitation    a. echo 10/2013: EF 62%, noWMA, mildly dilated LA, mild to mod MR/TR, GR1DD  . Myocardial infarction ([place])   . Orthopnea   . Parathyroid abnormality ([place])   . Peripheral arterial disease ([place])   . Pneumonia   . Renal cancer ([place])   . Renal insufficiency    Pt is on dialysis on M,W + F.  . Wheezing     Surgical History: Past Surgical History:  Procedure Laterality Date  . A/V SHUNTOGRAM Left 01/20/2018   Procedure: A/V SHUNTOGRAM;  Surgeon: Algernon Huxley, MD;  Location: [place] [facility];  Service: Cardiovascular;  Laterality: Left;  . ABDOMINAL HYSTERECTOMY  1992  . AMPUTATION TOE Left 10/02/2017   Procedure:  AMPUTATION TOE-LEFT GREAT TOE;  Surgeon: Albertine Patricia, DPM;  Location: [facility];  Service: Podiatry;  Laterality: Left;  . APPENDECTOMY    . APPLICATION OF WOUND VAC N/A 11/25/2019   Procedure: APPLICATION OF WOUND VAC;  Surgeon: Katha Cabal, MD;  Location: [facility];  Service: Vascular;  Laterality: N/A;  . ARTERY BIOPSY Right 10/11/2018   Procedure: BIOPSY TEMPORAL ARTERY;  Surgeon: Vickie Epley, MD;  Location: [facility];  Service: General;  Laterality: Right;  . CARDIAC CATHETERIZATION Left 07/26/2015   Procedure: Left Heart Cath and Coronary Angiography;  Surgeon: Dionisio David, MD;  Location: [place] [facility];  Service: Cardiovascular;  Laterality: Left;  . CATARACT EXTRACTION W/ INTRAOCULAR LENS IMPLANT Right   . CATARACT EXTRACTION W/PHACO Left 03/10/2017    Procedure: CATARACT EXTRACTION PHACO AND INTRAOCULAR LENS PLACEMENT (IOC);  Surgeon: Birder Robson, MD;  Location: [facility];  Service: Ophthalmology;  Laterality: Left;  [place] 00:51.9 AP% 14.2 CDE 7.39 fluid pack lot # [id number]  . CENTRAL LINE INSERTION Right 11/11/2019   Procedure: CENTRAL LINE INSERTION;  Surgeon: Katha Cabal, MD;  Location: [facility];  Service: Vascular;  Laterality: Right;  . CENTRAL LINE INSERTION  11/25/2019   Procedure: CENTRAL LINE INSERTION;  Surgeon: Katha Cabal, MD;  Location: [facility];  Service: Vascular;;  . CHOLECYSTECTOMY    . COLONOSCOPY WITH PROPOFOL N/A 08/12/2016   Procedure: COLONOSCOPY WITH PROPOFOL;  Surgeon: Lollie Sails, MD;  Location: [facility];  Service: Endoscopy;  Laterality: N/A;  . DIALYSIS FISTULA CREATION Left    upper arm  . dialysis grafts    . DIALYSIS/PERMA CATHETER INSERTION N/A 11/14/2019   Procedure: DIALYSIS/PERMA CATHETER INSERTION;  Surgeon: Algernon Huxley, MD;  Location: [place] [facility];  Service: Cardiovascular;  Laterality: N/A;  . DIALYSIS/PERMA CATHETER INSERTION N/A 02/03/2020   Procedure: DIALYSIS/PERMA CATHETER INSERTION;  Surgeon: Katha Cabal, MD;  Location: [place] [facility];  Service: Cardiovascular;  Laterality: N/A;  . ESOPHAGOGASTRODUODENOSCOPY N/A 03/08/2015   Procedure: ESOPHAGOGASTRODUODENOSCOPY (EGD);  Surgeon: Manya Silvas, MD;  Location: [facility] ENDOSCOPY;  Service: Endoscopy;  Laterality: N/A;  . ESOPHAGOGASTRODUODENOSCOPY (EGD) WITH PROPOFOL N/A 03/18/2016   Procedure: ESOPHAGOGASTRODUODENOSCOPY (EGD) WITH PROPOFOL;  Surgeon: [name], MD;  Location: [facility];  Service: Endoscopy;  Laterality: N/A;  . EYE SURGERY Right 2018  . FECAL TRANSPLANT N/A 08/23/2015   Procedure: FECAL TRANSPLANT;  Surgeon: Manya Silvas, MD;  Location: [facility];  Service: Endoscopy;  Laterality: N/A;  . HAND SURGERY Bilateral   . HEMATOMA EVACUATION Left 11/25/2019   Procedure: EVACUATION  HEMATOMA;  Surgeon: Katha Cabal, MD;  Location: [facility];  Service: Vascular;  Laterality: Left;  . I & D EXTREMITY Left 11/25/2019   Procedure: IRRIGATION AND DEBRIDEMENT EXTREMITY;  Surgeon: Katha Cabal, MD;  Location: [facility];  Service: Vascular;  Laterality: Left;  . IR RADIOLOGIST EVAL & MGMT  07/28/2019  . IR RADIOLOGIST EVAL & MGMT  08/11/2019  . LIGATION OF ARTERIOVENOUS  FISTULA Left 11/11/2019  . LIGATION OF ARTERIOVENOUS  FISTULA Left 11/11/2019   Procedure: LIGATION OF ARTERIOVENOUS  FISTULA;  Surgeon: Katha Cabal, MD;  Location: [facility];  Service: Vascular;  Laterality: Left;  . PERIPHERAL VASCULAR CATHETERIZATION N/A 12/20/2015   Procedure: Thrombectomy of dialysis access versus permcath placement;  Surgeon: Algernon Huxley, MD;  Location: [place] [facility];  Service: Cardiovascular;  Laterality: N/A;  . PERIPHERAL VASCULAR CATHETERIZATION N/A 12/20/2015   Procedure: A/V  Shunt Intervention;  Surgeon: Algernon Huxley, MD;  Location: [place] [facility];  Service: Cardiovascular;  Laterality: N/A;  . PERIPHERAL VASCULAR CATHETERIZATION N/A 12/20/2015   Procedure: A/V Shuntogram/Fistulagram;  Surgeon: Algernon Huxley, MD;  Location: [place] [facility];  Service: Cardiovascular;  Laterality: N/A;  . PERIPHERAL VASCULAR CATHETERIZATION N/A 01/02/2016   Procedure: A/V Shuntogram/Fistulagram;  Surgeon: Algernon Huxley, MD;  Location: [place] [facility];  Service: Cardiovascular;  Laterality: N/A;  . PERIPHERAL VASCULAR CATHETERIZATION N/A 01/02/2016   Procedure: A/V Shunt Intervention;  Surgeon: Algernon Huxley, MD;  Location: [place] [facility];  Service: Cardiovascular;  Laterality: N/A;  . UPPER EXTREMITY VENOGRAPHY Right 01/18/2020   Procedure: UPPER EXTREMITY VENOGRAPHY;  Surgeon: Katha Cabal, MD;  Location: [place] [facility];  Service: Cardiovascular;  Laterality: Right;    Home Medications:  Allergies as of 03/16/2020      Reactions   Ace Inhibitors Swelling,  Anaphylaxis   Ativan [lorazepam] Other (See Comments)   Reaction:Hallucinations and headaches   Compazine [prochlorperazine Edisylate] Anaphylaxis, Nausea And Vomiting, Other (See Comments)   Other reaction(s): dystonia from this vs. Reglan, 23 Jul - patient relates that she takes promethazine frequently with no problems   Sumatriptan Succinate Other (See Comments)   Other reaction(s): delirium and hallucinations per [facility] records   Zofran [ondansetron] Nausea And Vomiting   Per pt. she is allergic to zofran or will experience adverse reaction like hallucination    Lac Bovis Nausea And Vomiting   Losartan Nausea Only   Prochlorperazine Other (See Comments)   Reaction:  Unknown . Patient does not remember reaction but she does have vertigo and anxiety along with n and v at times. Could be used to treat any of these   Reglan [metoclopramide] Other (See Comments)   Per patient her Dr. Evelina Bucy her off it    Scopolamine Other (See Comments)   Dizziness, also has vertigo already   Tape Rash   Plastic tape causes rash   Tapentadol Rash      Medication List       Accurate as of March 16, 2020 11:59 PM. If you have any questions, ask your nurse or doctor.        STOP taking these medications   hydrocortisone cream 0.5 % Stopped by: Hollice Espy, MD     TAKE these medications   albuterol (2.5 MG/3ML) 0.083% nebulizer solution Commonly known as: PROVENTIL Take 2.5 mg by nebulization every 4 (four) hours as needed for wheezing or shortness of breath.   albuterol 108 (90 Base) MCG/ACT inhaler Commonly known as: VENTOLIN HFA Inhale 2 puffs into the lungs every 4 (four) hours as needed for wheezing or shortness of breath.   amLODipine 10 MG tablet Commonly known as: NORVASC Take 10 mg by mouth daily.   ammonium lactate 12 % lotion Commonly known as: LAC-HYDRIN Apply 1 application topically in the morning and at bedtime.   aspirin EC 81 MG tablet Take 81 mg by mouth daily.     atorvastatin 20 MG tablet Commonly known as: LIPITOR Take 20 mg by mouth daily.   carvedilol 6.25 MG tablet Commonly known as: COREG Take 6.25 mg by mouth 2 (two) times daily.   cinacalcet 30 MG tablet Commonly known as: SENSIPAR Take 30 mg by mouth daily.   citalopram 20 MG tablet Commonly known as: CELEXA Take 20 mg by mouth daily.   clopidogrel 75 MG tablet Commonly known as: PLAVIX Take 75 mg  by mouth at bedtime.   Combivent Respimat 20-100 MCG/ACT Aers respimat Generic drug: Ipratropium-Albuterol Inhale 2 puffs into the lungs every 6 (six) hours as needed for wheezing or shortness of breath. Shortness of breath   Creon 12000-38000 units Cpep capsule Generic drug: lipase/protease/amylase Take 12,000 Units by mouth See admin instructions. Take 36,000 with meals and 24,000 with snacks three times a day   cyanocobalamin 1000 MCG tablet Take 1,000 mcg by mouth daily.   diclofenac sodium 1 % Gel Commonly known as: VOLTAREN Apply 1 application topically in the morning and at bedtime.   diphenhydrAMINE 25 MG tablet Commonly known as: BENADRYL Take 25 mg by mouth every 6 (six) hours as needed for itching.   gabapentin 100 MG capsule Commonly known as: NEURONTIN Take 100 mg by mouth 2 (two) times daily.   hydrALAZINE 10 MG tablet Commonly known as: APRESOLINE Take 10 mg by mouth 3 (three) times daily.   HYDROcodone-acetaminophen 5-325 MG tablet Commonly known as: NORCO/VICODIN Take 1 tablet by mouth 4 (four) times daily as needed for moderate pain.   hydrOXYzine 25 MG tablet Commonly known as: ATARAX/VISTARIL Take 25 mg by mouth See admin instructions. Take on Tues, Thurs, and sat after dialysis Monday Wednesday Friday and Sunday daily in the morning   insulin aspart 100 UNIT/ML injection Commonly known as: novoLOG Inject 4-15 Units into the skin in the morning, at noon, in the evening, and at bedtime. Sliding scale   Iron 325 (65 Fe) MG Tabs Take 325 mg by  mouth daily.   Levemir 100 UNIT/ML injection Generic drug: insulin detemir Inject 67 Units into the skin at bedtime.   meclizine 25 MG tablet Commonly known as: ANTIVERT Take 12.5 mg by mouth every 6 (six) hours.   multivitamin Tabs tablet Take 1 tablet by mouth daily.   mupirocin ointment 2 % Commonly known as: BACTROBAN Apply 1 application topically daily as needed (leg rash).   nitroGLYCERIN 0.4 MG SL tablet Commonly known as: NITROSTAT Place 0.4 mg under the tongue every 5 (five) minutes as needed for chest pain.   Omega-3 300 MG Caps Take 300 mg by mouth 2 (two) times daily. Every other day opposite omega XL   OVER THE COUNTER MEDICATION Take 1 tablet by mouth in the morning and at bedtime. Omega XL/ every other day opposite omega 3   pantoprazole 40 MG tablet Commonly known as: PROTONIX Take 40 mg by mouth daily.   PROBIOTIC PO Take 1 capsule by mouth daily.   promethazine 12.5 MG tablet Commonly known as: PHENERGAN Take 1 tablet (12.5 mg total) by mouth every 8 (eight) hours as needed for nausea.   QC TUMERIC COMPLEX PO Take 250 mg by mouth daily.   sevelamer carbonate 800 MG tablet Commonly known as: RENVELA Take 800-2,400 mg by mouth See admin instructions. Take 2400 mg by mouth 3 times daily with meals and take [phone] mg by mouth with snacks   VITA-C PO Take 1 tablet by mouth daily.   Vitamin D3 25 MCG (1000 UT) Caps Take 1,000 Units by mouth daily.   Zinc 50 MG Caps Take 50 mg by mouth daily.       Allergies:  Allergies  Allergen Reactions  . Ace Inhibitors Swelling and Anaphylaxis  . Ativan [Lorazepam] Other (See Comments)    Reaction:Hallucinations and headaches  . Compazine [Prochlorperazine Edisylate] Anaphylaxis, Nausea And Vomiting and Other (See Comments)    Other reaction(s): dystonia from this vs. Reglan, 23 Jul - patient  relates that she takes promethazine frequently with no problems  . Sumatriptan Succinate Other (See  Comments)    Other reaction(s): delirium and hallucinations per [facility] records  . Zofran [Ondansetron] Nausea And Vomiting    Per pt. she is allergic to zofran or will experience adverse reaction like hallucination   . Lac Bovis Nausea And Vomiting  . Losartan Nausea Only  . Prochlorperazine Other (See Comments)    Reaction:  Unknown . Patient does not remember reaction but she does have vertigo and anxiety along with n and v at times. Could be used to treat any of these   . Reglan [Metoclopramide] Other (See Comments)    Per patient her Dr. Evelina Bucy her off it   . Scopolamine Other (See Comments)    Dizziness, also has vertigo already  . Tape Rash    Plastic tape causes rash  . Tapentadol Rash    Family History: Family History  Problem Relation Age of Onset  . Kidney disease Mother   . Diabetes Mother   . Cancer Father   . Kidney disease Sister     Social History:  reports that she has never smoked. She has never used smokeless tobacco. She reports previous alcohol use. She reports current drug use. Drug: Marijuana.   Physical Exam: BP (!) 178/97   Pulse 60   Ht 5\' 5"  (1.651 m)   Wt 182 lb (82.6 kg)   BMI 30.29 kg/m   Constitutional:  Alert and oriented, No acute distress. HEENT: [place] AT, moist mucus membranes.  Trachea midline, no masses. Cardiovascular: No clubbing, cyanosis, or edema. Respiratory: Normal respiratory effort, no increased work of breathing. Skin: No rashes, bruises or suspicious lesions. Neurologic: Grossly intact, no focal deficits, moving all 4 extremities. Psychiatric: Normal mood and affect.  Laboratory Data:  Lab Results  Component Value Date   CREATININE 4.21 (H) 12/07/2019   Lab Results  Component Value Date   HGBA1C 4.7 (L) 11/15/2019    Urinalysis UA today- pending   Pertinent Imaging: CLINICAL DATA:  History of enhancing right renal masses, presenting for follow-up. Stage renal disease on dialysis.  EXAM: MRI ABDOMEN WITHOUT  CONTRAST  TECHNIQUE: Multiplanar multisequence MR imaging was performed without the administration of intravenous contrast.  COMPARISON:  12/07/2018 CT abdomen without and with IV contrast. 07/07/2019 renal sonogram.  FINDINGS: Lower chest: Trace dependent right pleural effusion.  Hepatobiliary: Normal liver size and configuration. Diffuse liver hemosiderosis. No liver mass. Cholecystectomy. Bile ducts are stable and within normal post cholecystectomy limits. Common bile duct diameter 8 mm. No evidence of choledocholithiasis. No biliary strictures or beading.  Pancreas: No pancreatic mass or duct dilation.  No pancreas divisum.  Spleen: Normal size spleen. Diffuse splenic hemosiderosis. No splenic mass.  Adrenals/Urinary Tract: Normal adrenals. No hydronephrosis. Symmetric severe bilateral renal atrophy, unchanged. Medial upper right kidney 1.5 x 1.3 cm cortical mass (series 10/image 15) is similar in signal intensity to the renal cortex on T1 and T2 sequences, poorly visualized without IV contrast, previously 1.5 x 1.3 cm on 12/07/2018 CT, not appreciably changed in size. Posterior upper right kidney 1.3 x 1.2 cm cortical mass (series 10/image 16) is similar in signal intensity to the renal cortex on T1 and T2 sequences, poorly visualized without IV contrast, previously 1.3 x 1.2 cm on 12/07/2018 CT, not appreciably changed in size. Previously visualized 0.6 cm enhancing renal cortical lesion in the anterior upper right kidney is occult on today's noncontrast MRI study. Subcentimeter renal cortical cysts scattered  in both kidneys. No new suspicious renal masses on this noncontrast study.  Stomach/Bowel: Normal non-distended stomach. Visualized small and large bowel is normal caliber, with no bowel wall thickening. Colonic diverticulosis.  Vascular/Lymphatic: Normal caliber abdominal aorta. No pathologically enlarged lymph nodes in the abdomen.  Other: No  abdominal ascites or focal fluid collection.  Musculoskeletal: No aggressive appearing focal osseous lesions.  IMPRESSION: 1. Known enhancing renal cortical masses in the medial upper right kidney and posterior upper right kidney are stable in size since 12/07/2018 CT. Separate known enhancing subcentimeter renal cortical mass in the anterior upper right kidney is occult on today's study. No appreciable new suspicious renal masses. 2. No adenopathy or other findings of metastatic disease in the abdomen on this noncontrast study. 3. Trace dependent right pleural effusion. 4. Diffuse hemosiderosis in the liver and spleen, presumably related to prior blood transfusions. 5. Colonic diverticulosis.   Electronically Signed   By: Ilona Sorrel M.D.   On: 08/11/2019 10:04  I have personally reviewed the images and agree with radiologist interpretation.   Assessment & Plan:    1. Renal mass Given small right-sided renal mass with slow interval growth indicated on MRI in 07/2019 and  multiple medical issues, will plan to repeat MRI in November    Agrees with continued surveillance  Extensive chart review today of recent admissions  2. Dysuria/urinary urgency  Will obtain UA/culture to rule out UTI  Will call w/ results, sent to lab after visit to provide sample  F/u nov with MR  [facility] [address] [place], [place] [address] [phone]  I, Lucas Mallow, am acting as a scribe for Dr. Hollice Espy,  I have reviewed the above documentation for accuracy and completeness, and I agree with the above.   Hollice Espy, MD    I spent 30 total minutes on the day of the encounter including pre-visit review of the medical record, face-to-face time with the patient, and post visit ordering of labs/imaging/tests.

## 2020-03-16 ENCOUNTER — Ambulatory Visit (INDEPENDENT_AMBULATORY_CARE_PROVIDER_SITE_OTHER): Payer: Medicare Other | Admitting: Urology

## 2020-03-16 ENCOUNTER — Other Ambulatory Visit: Payer: Self-pay

## 2020-03-16 ENCOUNTER — Other Ambulatory Visit
Admission: RE | Admit: 2020-03-16 | Discharge: 2020-03-16 | Disposition: A | Discharge status: Home / Self Care | Payer: Medicare Other | Attending: Urology | Admitting: Urology

## 2020-03-16 ENCOUNTER — Encounter: Payer: Self-pay | Admitting: Urology

## 2020-03-16 VITALS — BP 178/97 | HR 60 | Ht 65.0 in | Wt 182.0 lb

## 2020-03-16 DIAGNOSIS — N2889 Other specified disorders of kidney and ureter: Secondary | ICD-10-CM

## 2020-03-16 IMAGING — US US DIALYSIS ACCESS
1 series · 14 of 16 positions shown · non-contrast
Comparison: None.

CLINICAL DATA: Status post prior left brachial artery to axillary
vein dialysis graft placement. Apparent area clinical infection with
purulent drainage at the level of the dialysis graft in the left
upper arm.

EXAM:
ULTRASOUND DIALYSIS ACCESS
TECHNIQUE: Sonographic evaluation of the dialysis graft and immediate
surrounding soft tissues was performed.

[Series 1: us dialysis access · 14 of 52 slices shown]
[im 1/52]
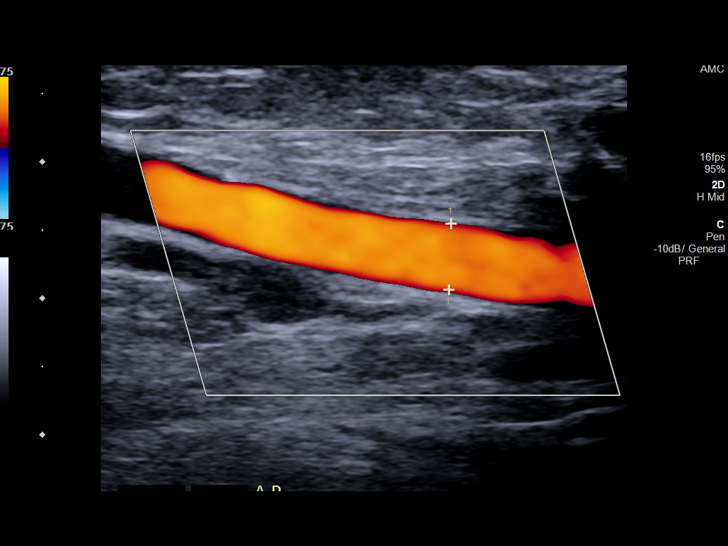
[im 4/52]
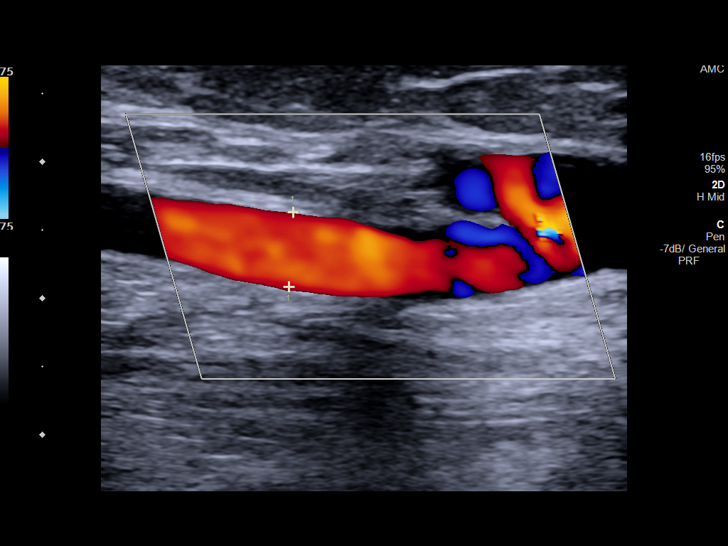
[im 7/52]
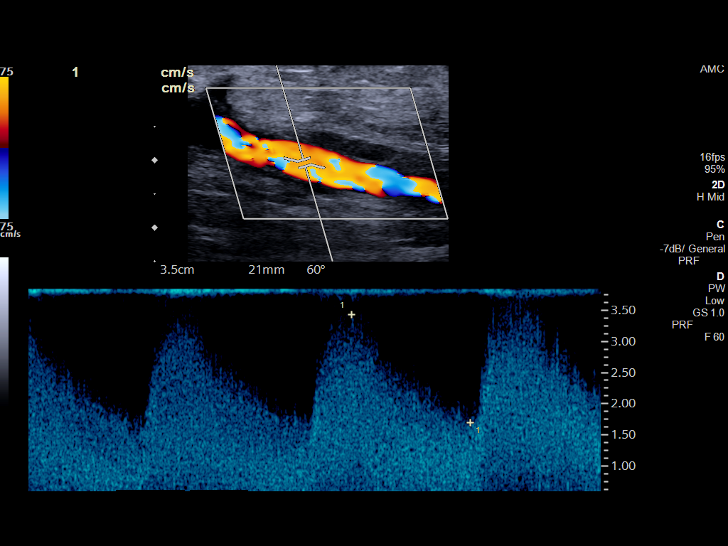
[im 14/52]
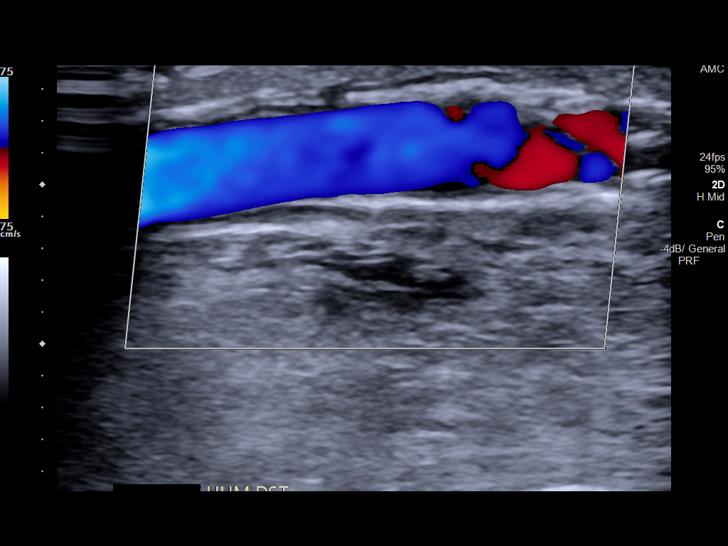
[im 18/52]
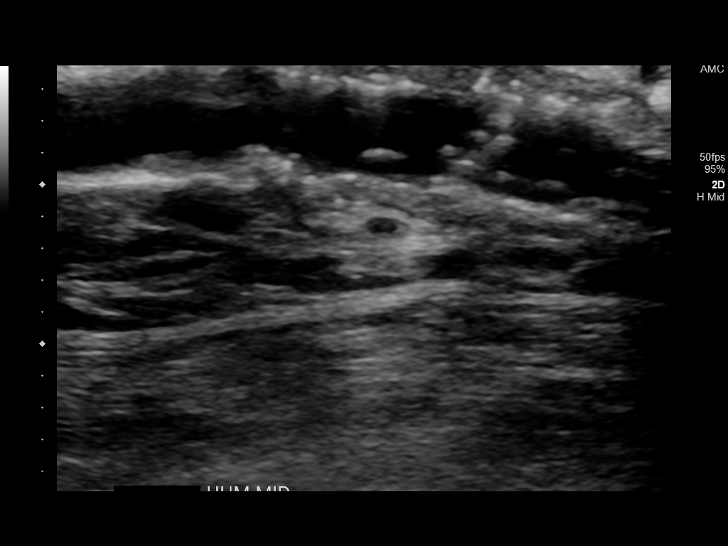
[im 21/52]
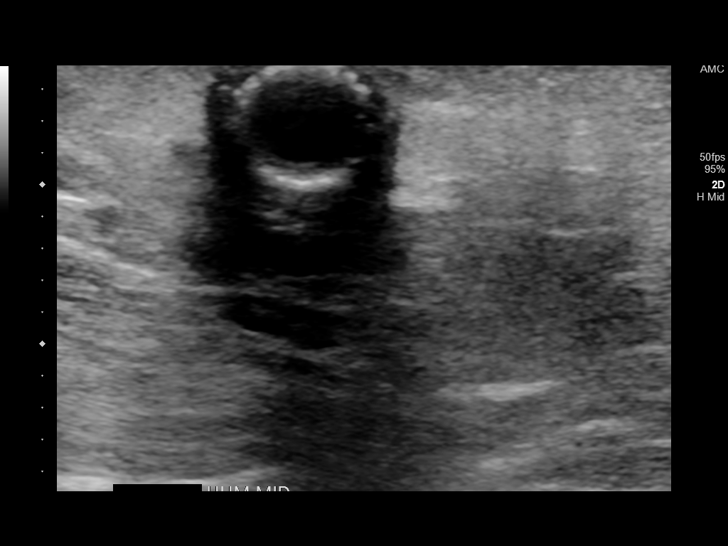
[im 24/52]
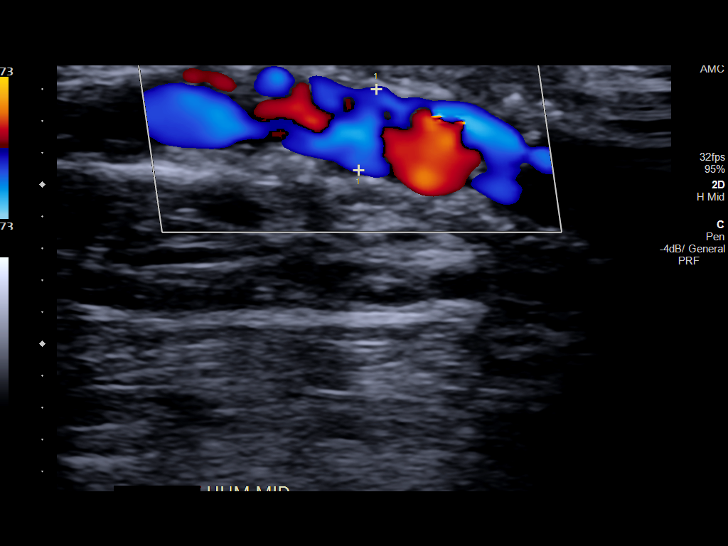
[im 28/52]
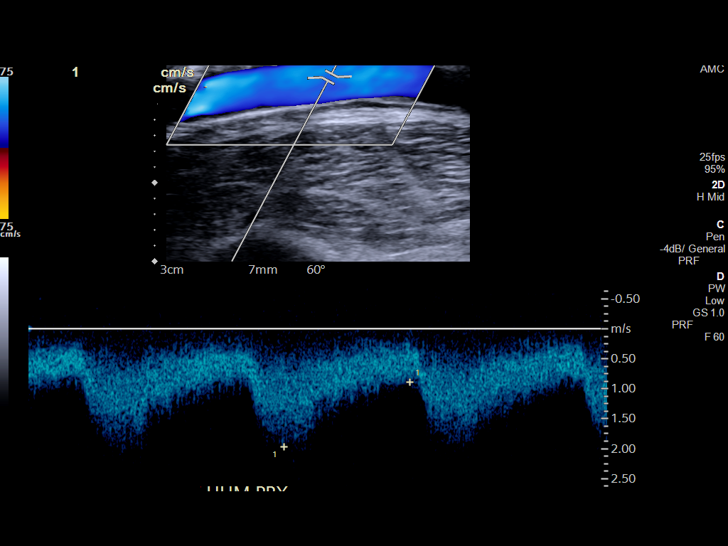
[im 31/52]
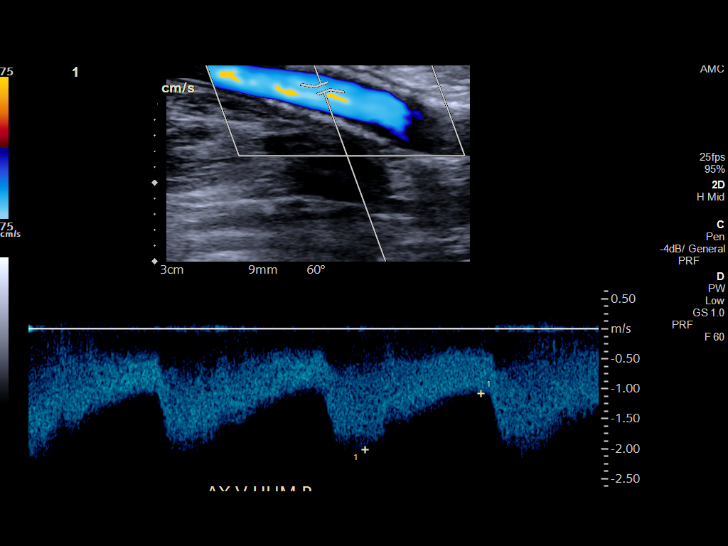
[im 35/52]
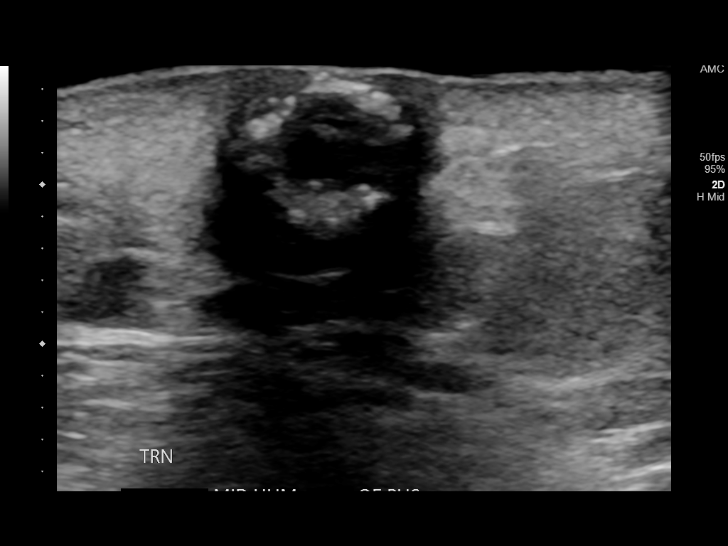
[im 41/52]
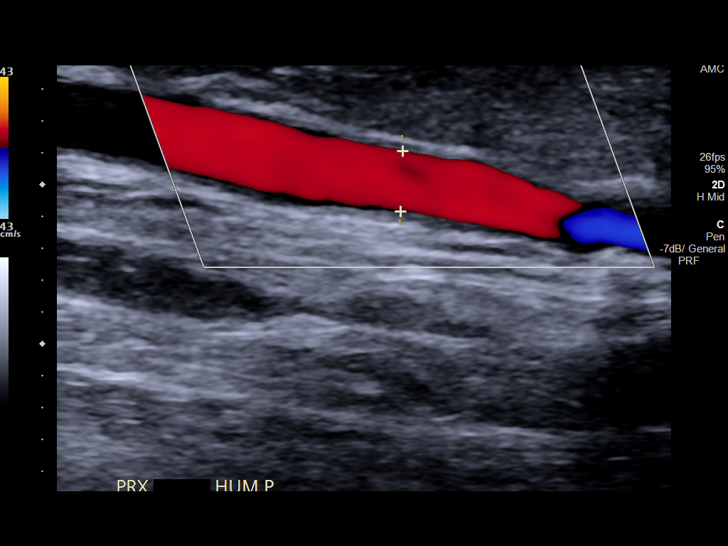
[im 45/52]
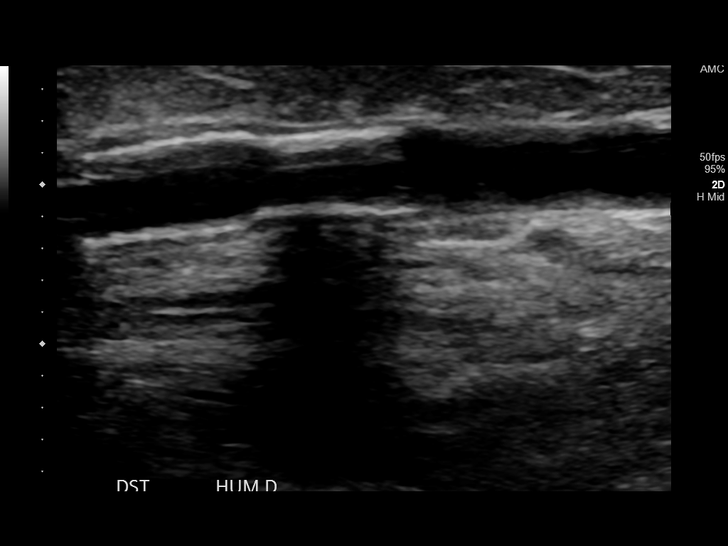
[im 48/52]
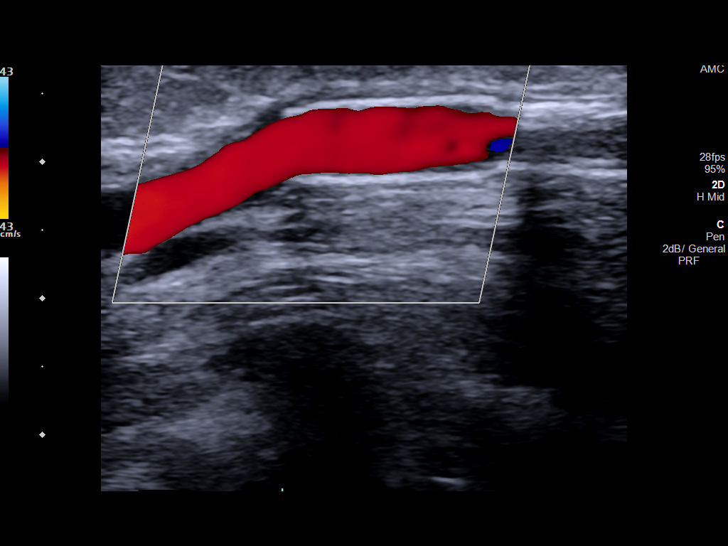
[im 52/52]
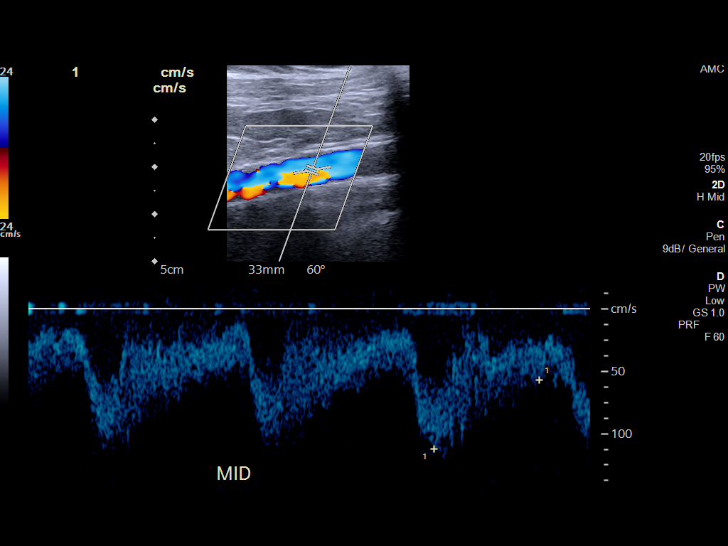

[14 of 16 positions shown; findings below may reference images not displayed]

FINDINGS: Arterial anastomosis between the brachial artery and dialysis graft
is normally patent. The graft itself demonstrates patent flow. There
are some areas of intragraft irregularity at the level of a
previously placed stent and irregularity at the venous anastomosis
at the level of a previously placed stent. No focal abscess or
significant perigraft fluid is demonstrated by ultrasound.
IMPRESSION: Patent left upper arm straight dialysis graft between brachial
artery and axillary vein including previously stented segments. No
focal abscess or evidence of significant fluid around the graft by
ultrasound.

## 2020-03-18 IMAGING — DX DG CHEST 1V PORT
1 series · 1 of 1 positions shown · non-contrast
Comparison: 09/30/2019.

CLINICAL DATA: Respiratory failure.

EXAM:
PORTABLE CHEST 1 VIEW

[chest ap]
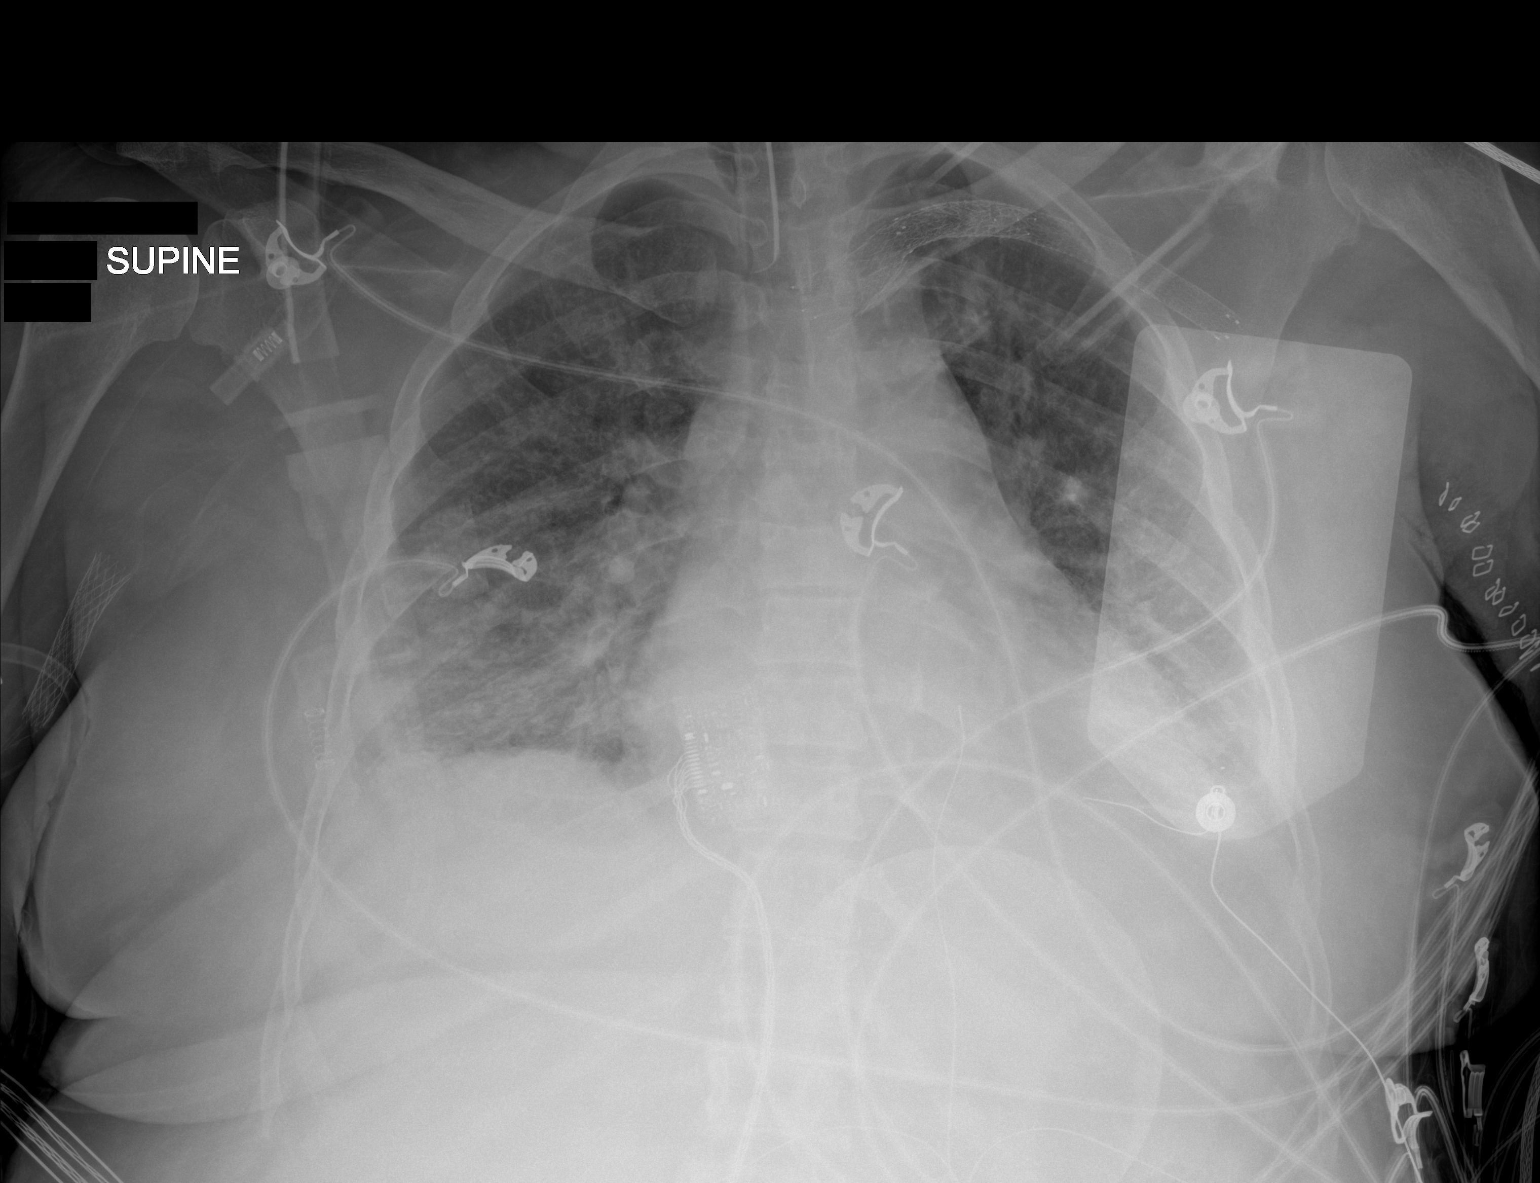

[1 of 1 positions shown; findings below may reference images not displayed]

FINDINGS: Endotracheal tube noted with tip 5 cm above the carina. Heart size
normal. Bilateral pulmonary infiltrates/edema. Small bilateral
pleural effusion. No pneumothorax. Left subclavian stents noted.
Surgical staples noted over the left chest.
IMPRESSION: 1.  Endotracheal tube noted with tip 5 cm above the carina.

2.  Diffuse bilateral pulmonary infiltrates/edema.

## 2020-03-20 ENCOUNTER — Other Ambulatory Visit (INDEPENDENT_AMBULATORY_CARE_PROVIDER_SITE_OTHER): Payer: Self-pay | Admitting: Nurse Practitioner

## 2020-03-21 ENCOUNTER — Telehealth: Payer: Self-pay | Admitting: Physician Assistant

## 2020-03-21 ENCOUNTER — Other Ambulatory Visit: Payer: Self-pay

## 2020-03-21 ENCOUNTER — Encounter
Admission: RE | Admit: 2020-03-21 | Discharge: 2020-03-21 | Disposition: A | Discharge status: Home / Self Care | Payer: Medicare Other | Source: Ambulatory Visit | Attending: Vascular Surgery | Admitting: Vascular Surgery

## 2020-03-21 LAB — URINE CULTURE: Culture: >=100000 — AB

## 2020-03-21 MED ORDER — SULFAMETHOXAZOLE-TRIMETHOPRIM 400-80 MG PO TABS: ORAL_TABLET | ORAL | 0 refills | Status: DC

## 2020-03-21 MED ORDER — SULFAMETHOXAZOLE-TRIMETHOPRIM 800-160 MG PO TABS: ORAL_TABLET | ORAL | 0 refills | Status: DC

## 2020-03-21 NOTE — Patient Instructions (Signed)
Your procedure is scheduled on: 03/30/20 Report to [place]. To find out your arrival time please call [phone] between 1PM - 3PM on 03/29/20.  Remember: Instructions that are not followed completely may result in serious medical risk, up to and including death, or upon the discretion of your surgeon and anesthesiologist your surgery may need to be rescheduled.     _X__ 1. Do not eat food after midnight the night before your procedure.                 No gum chewing or hard candies. You may drink clear liquids up to 2 hours                 before you are scheduled to arrive for your surgery- DO not drink clear                 liquids within 2 hours of the start of your surgery.                 Clear Liquids include:  water, apple juice without pulp, clear carbohydrate                 drink such as Clearfast or Gatorade, Black Coffee or Tea (Do not add                 anything to coffee or tea). Diabetics water only  __X__2.  On the morning of surgery brush your teeth with toothpaste and water, you                 may rinse your mouth with mouthwash if you wish.  Do not swallow any              toothpaste of mouthwash.     _X__ 3.  No Alcohol for 24 hours before or after surgery.   _X__ 4.  Do Not Smoke or use e-cigarettes For 24 Hours Prior to Your Surgery.                 Do not use any chewable tobacco products for at least 6 hours prior to                 surgery.  ____  5.  Bring all medications with you on the day of surgery if instructed.   __X__  6.  Notify your doctor if there is any change in your medical condition      (cold, fever, infections).     Do not wear jewelry, make-up, hairpins, clips or nail polish. Do not wear lotions, powders, or perfumes.  Do not shave 48 hours prior to surgery. Men may shave face and neck. Do not bring valuables to the hospital.    [employer] is not responsible for any belongings or  valuables.  Contacts, dentures/partials or body piercings may not be worn into surgery. Bring a case for your contacts, glasses or hearing aids, a denture cup will be supplied. Leave your suitcase in the car. After surgery it may be brought to your room. For patients admitted to the hospital, discharge time is determined by your treatment team.   Patients discharged the day of surgery will not be allowed to drive home.   Please read over the following fact sheets that you were given:   MRSA Information  __X__ Take these medicines the morning of surgery with A SIP OF WATER:  1. amLODipine (NORVASC) 10 MG tablet  2. carvedilol (COREG) 6.25 MG tablet  3. citalopram (CELEXA) 20 MG tablet  4. gabapentin (NEURONTIN) 100 MG capsule  5. hydrALAZINE (APRESOLINE) 10 MG tablet  6. hydrOXYzine (ATARAX/VISTARIL) 25 MG tablet  7. pantoprazole (PROTONIX) 40 MG tablet  8.    ____ Fleet Enema (as directed)   __X__ Use CHG Soap/SAGE wipes as directed  __X__ Use inhalers on the day of surgery USE YOUR NEBULIZER   ____ Stop metformin/Janumet/Farxiga 2 days prior to surgery    __X__ Take 1/2 of usual insulin dose the night before surgery. No insulin the morning          of surgery.   __X__ Stop Blood Thinners Coumadin/Plavix/Xarelto/Pleta/Pradaxa/Eliquis/Effient/Aspirin  on   Or contact your Surgeon, Cardiologist or Medical Doctor regarding  ability to stop your blood thinners  __X__ Stop Anti-inflammatories 7 days before surgery such as Advil, Ibuprofen, Motrin,  BC or Goodies Powder, Naprosyn, Naproxen, Aleve, Aspirin    __X__ Stop all herbal supplements, fish oil or vitamin E until after surgery.    ____ Bring C-Pap to the hospital.    HOLD YOUR PLAVIX, ZINC/VITAMIN C/VITAMIN B, TURMERIC, OMEGA XL

## 2020-03-21 NOTE — Telephone Encounter (Signed)
Prescribing Bactrim DSx1 + SS daily x4 for treatment of UTI (dialysis dosing). Attempted to contact patient via telephone; mailbox full. I spoke with her son via telephone to ask him to have her call us back at clinic. He expressed understanding.

## 2020-03-22 ENCOUNTER — Other Ambulatory Visit: Payer: Self-pay | Admitting: Interventional Radiology

## 2020-03-22 DIAGNOSIS — N2889 Other specified disorders of kidney and ureter: Secondary | ICD-10-CM

## 2020-03-22 DIAGNOSIS — Z87898 Personal history of other specified conditions: Secondary | ICD-10-CM

## 2020-03-22 IMAGING — DX DG CHEST 1V PORT
1 series · 1 of 1 positions shown · non-contrast
Comparison: 11/11/2019

CLINICAL DATA: Short of breath.

EXAM:
PORTABLE CHEST 1 VIEW

[chest ap]
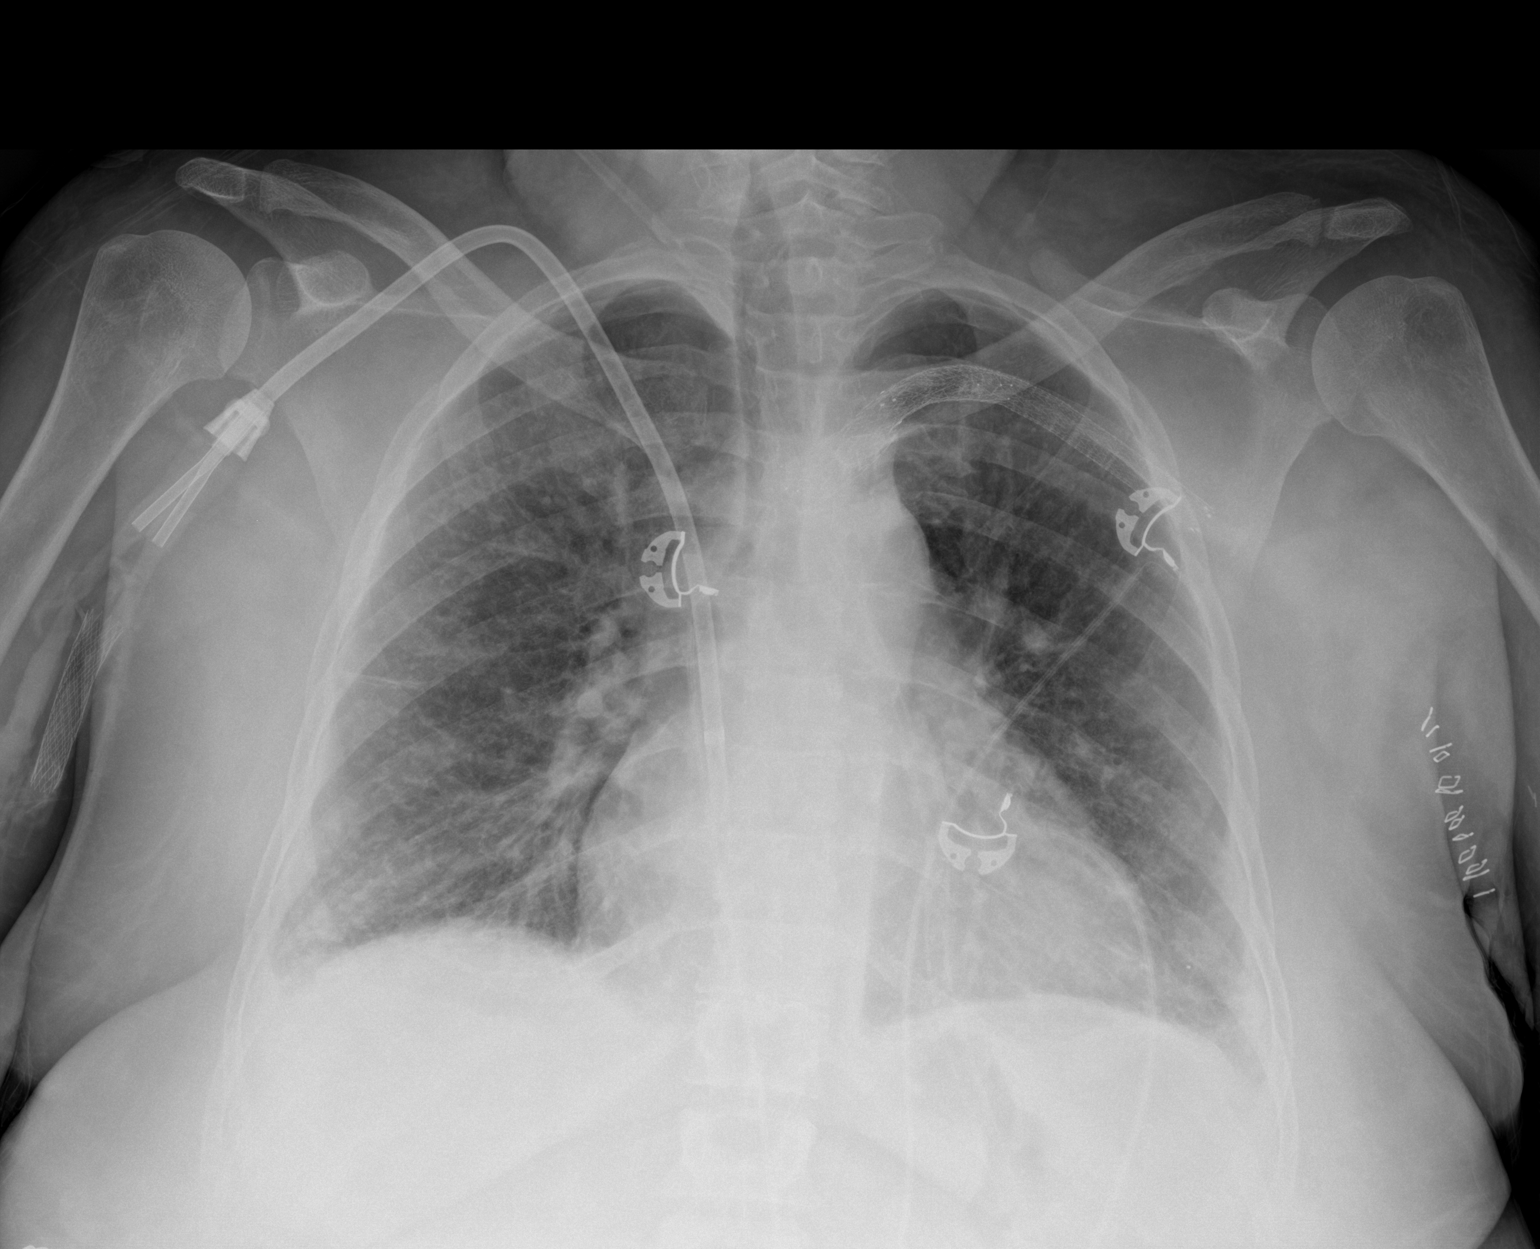

[1 of 1 positions shown; findings below may reference images not displayed]

FINDINGS: Right chest wall dialysis catheter is identified with tip at the
cavoatrial junction. Heart size normal. Trace pleural effusions and
mild interstitial edema identified. No airspace consolidation.
IMPRESSION: Suspect mild CHF.

## 2020-03-22 NOTE — Telephone Encounter (Signed)
I just spoke with the patient via telephone. She picked up her abx yesterday. I reviewed her dosing instructions to take the single DS dose on the first day followed by the SS doses once daily thereafter, and to reserve abx for after dialysis on dialysis days. She expressed understanding.

## 2020-03-23 ENCOUNTER — Encounter: Payer: Self-pay | Admitting: Urgent Care

## 2020-03-27 ENCOUNTER — Encounter: Payer: Self-pay | Admitting: Urgent Care

## 2020-03-27 NOTE — Progress Notes (Signed)
[facility] Perioperative Services  Pre-Admission/Anesthesia Testing Clinical Review  Date: 03/27/20  Patient Demographics:  Name: [name] DOB:   Feb 28, 1961 MRN:   [id number]  Planned Surgical Procedure(s):    Case: [id number] Date/Time: 03/30/20 0715   Procedure: INSERTION OF HERO VASCULAR ACCESS DEVICE (GRAFT) (Right )   Anesthesia type: General   Pre-op diagnosis: ESRD   Location: [facility] OR ROOM 07 / [facility]S FOR ANESTHESIA GROUP   Surgeons: Katha Cabal, MD     NOTE: Available PAT nursing documentation and vital signs have been reviewed. Clinical nursing staff has updated patient's PMH/PSHx, current medication list, and drug allergies/intolerances to ensure comprehensive history available to assist in medical decision making as it pertains to the aforementioned surgical procedure and anticipated anesthetic course.   Clinical Discussion:  [name] is a 59 y.o. female who is submitted for pre-surgical anesthesia review and clearance prior to her undergoing the above procedure. Patient has never been a smoker. Pertinent PMH includes: CAD, MI, anginal pain, diastolic heart failure, MV regurgitation, HTN, HLD, ESRD (on HD on M-W-F), orthopnea, COPD, respiratory failure, pulmonary edema, GERD, hiatal hernia, gastroparesis, DM, anemia of chronic disease, peripheral edema, PVD, lymphedema,  anxiety/depression, TIA, recurrent pneumonia.   Patient is followed by cardiology Nehemiah Massed, MD). She was last seen on 03/05/2020; noted reviewed. At that time, patient complained of shortness of breath and fatigue. She denied chest pain. Functional status documented as < 4 METS. Recent echocardiogram revealed an LVEF of 55-60% (see below for full results). Dr. Nehemiah Massed issued clearance for patient's upcoming vascular surgery noting that the patient should "proceed with surgery. Overall risk of cardiac complication with surgery and/or procedure is low; < 1%".  She denies  previous intra-operative complications with anesthesia. Patient underwent procedure here on 11/25/2019 (ASA IV) with no documented complications. This patient is on daily anticoagulation and anti-platelet therapies. She has been instructed on recommendations for holding her clopidogrel and ASA for 7 days prior to her procedure. The patient has been instructed that her last dose of her anticoagulant will be on 03/23/2020.  Vitals with BMI 03/21/2020 03/16/2020 03/12/2020  Height 5' 5.5" 5\' 5"  -  Weight 182 lbs 182 lbs 182 lbs 14 oz  BMI 02.63 78.58 -  Systolic - 850 277  Diastolic - 97 76  Pulse - 60 64  Some encounter information is confidential and restricted. Go to Review Flowsheets activity to see all data.    Providers/Specialists:   PROVIDER ROLE LAST Anne Hahn, Dolores Lory, MD Vascular (Surgeon) 02/22/2020  Center, [place] Primary Care Provider ?  Serafina Royals, MD Cardiology 03/05/2020   Allergies:  Ace inhibitors, Ativan [lorazepam], Compazine [prochlorperazine edisylate], Sumatriptan succinate, Zofran [ondansetron], Lac bovis, Losartan, Prochlorperazine, Reglan [metoclopramide], Scopolamine, Tape, and Tapentadol  Current Home Medications:   No current facility-administered medications for this encounter.   Marland Kitchen albuterol (PROVENTIL) (2.5 MG/3ML) 0.083% nebulizer solution  . albuterol (VENTOLIN HFA) 108 (90 Base) MCG/ACT inhaler  . amLODipine (NORVASC) 10 MG tablet  . ammonium lactate (LAC-HYDRIN) 12 % lotion  . Ascorbic Acid (VITA-C PO)  . aspirin EC 81 MG tablet  . atorvastatin (LIPITOR) 20 MG tablet  . carvedilol (COREG) 6.25 MG tablet  . Cholecalciferol (VITAMIN D3) 25 MCG (1000 UT) CAPS  . cinacalcet (SENSIPAR) 30 MG tablet  . citalopram (CELEXA) 20 MG tablet  . clopidogrel (PLAVIX) 75 MG tablet  . COMBIVENT RESPIMAT 20-100 MCG/ACT AERS respimat  . CREON 12000 units CPEP capsule  .  cyanocobalamin 1000 MCG tablet  . diclofenac sodium (VOLTAREN) 1  % GEL  . diphenhydrAMINE (BENADRYL) 25 MG tablet  . Ferrous Sulfate (IRON) 325 (65 Fe) MG TABS  . gabapentin (NEURONTIN) 100 MG capsule  . hydrALAZINE (APRESOLINE) 10 MG tablet  . HYDROcodone-acetaminophen (NORCO/VICODIN) 5-325 MG tablet  . hydrOXYzine (ATARAX/VISTARIL) 25 MG tablet  . insulin aspart (NOVOLOG) 100 UNIT/ML injection  . insulin detemir (LEVEMIR) 100 UNIT/ML injection  . meclizine (ANTIVERT) 25 MG tablet  . multivitamin (RENA-VIT) TABS tablet  . mupirocin ointment (BACTROBAN) 2 %  . nitroGLYCERIN (NITROSTAT) 0.4 MG SL tablet  . Omega-3 300 MG CAPS  . pantoprazole (PROTONIX) 40 MG tablet  . Probiotic Product (PROBIOTIC PO)  . promethazine (PHENERGAN) 12.5 MG tablet  . sevelamer carbonate (RENVELA) 800 MG tablet  . Turmeric (QC TUMERIC COMPLEX PO)  . Zinc 50 MG CAPS  . sulfamethoxazole-trimethoprim (BACTRIM DS) 800-160 MG tablet  . sulfamethoxazole-trimethoprim (BACTRIM) 400-80 MG tablet   History:   Past Medical History:  Diagnosis Date  . Anemia   . Anginal pain ([place])   . Anxiety   . Arthritis   . Asthma   . Broken wrist   . Bronchitis   . chronic diastolic CHF 01/30/2584  . Chronic kidney disease   . COPD (chronic obstructive pulmonary disease) ([place])   . Coronary artery disease    a. cath 2013: stenting to RCA (report not available); b. cath 2014: LM nl, pLAD 40%, mLAD nl, ost LCx 40%, mid LCx nl, pRCA 30% @ site of prior stent, mRCA 50%  . Depression   . Diabetes mellitus ([place])   . Diabetes mellitus without complication ([place])   . Diabetic neuropathy ([place])   . dialysis 2006  . Diverticulosis   . Dizziness   . Dyspnea   . Elevated lipids   . Environmental and seasonal allergies   . ESRD (end stage renal disease) on dialysis ([place])    M-W-F  . Gastroparesis   . GERD (gastroesophageal reflux disease)   . Headache   . History of anemia due to chronic kidney disease   . History of hiatal hernia   . HOH (hard of hearing)   . Hx of pancreatitis 2015  .  Hypertension   . Lower extremity edema   . Mitral regurgitation    a. echo 10/2013: EF 62%, noWMA, mildly dilated LA, mild to mod MR/TR, GR1DD  . Myocardial infarction ([place])   . Orthopnea   . Parathyroid abnormality ([place])   . Peripheral arterial disease ([place])   . Pneumonia   . Renal cancer ([place])   . Renal insufficiency    Pt is on dialysis on M,W + F.  . Wheezing    Past Surgical History:  Procedure Laterality Date  . A/V SHUNTOGRAM Left 01/20/2018   Procedure: A/V SHUNTOGRAM;  Surgeon: Algernon Huxley, MD;  Location: [place] CV LAB;  Service: Cardiovascular;  Laterality: Left;  . ABDOMINAL HYSTERECTOMY  1992  . AMPUTATION TOE Left 10/02/2017   Procedure: AMPUTATION TOE-LEFT GREAT TOE;  Surgeon: Albertine Patricia, DPM;  Location: [facility] ORS;  Service: Podiatry;  Laterality: Left;  . APPENDECTOMY    . APPLICATION OF WOUND VAC N/A 11/25/2019   Procedure: APPLICATION OF WOUND VAC;  Surgeon: Katha Cabal, MD;  Location: [facility] ORS;  Service: Vascular;  Laterality: N/A;  . ARTERY BIOPSY Right 10/11/2018   Procedure: BIOPSY TEMPORAL ARTERY;  Surgeon: Vickie Epley, MD;  Location: [facility] ORS;  Service: General;  Laterality: Right;  .  CARDIAC CATHETERIZATION Left 07/26/2015   Procedure: Left Heart Cath and Coronary Angiography;  Surgeon: Dionisio David, MD;  Location: [place] [facility] LAB;  Service: Cardiovascular;  Laterality: Left;  . CATARACT EXTRACTION W/ INTRAOCULAR LENS IMPLANT Right   . CATARACT EXTRACTION W/PHACO Left 03/10/2017   Procedure: CATARACT EXTRACTION PHACO AND INTRAOCULAR LENS PLACEMENT (IOC);  Surgeon: Birder Robson, MD;  Location: [facility] ORS;  Service: Ophthalmology;  Laterality: Left;  [place] 00:51.9 AP% 14.2 CDE 7.39 fluid pack lot # [id number]  . CENTRAL LINE INSERTION Right 11/11/2019   Procedure: CENTRAL LINE INSERTION;  Surgeon: Katha Cabal, MD;  Location: [facility] ORS;  Service: Vascular;  Laterality: Right;  . CENTRAL LINE INSERTION  11/25/2019   Procedure: CENTRAL  LINE INSERTION;  Surgeon: Katha Cabal, MD;  Location: [facility] ORS;  Service: Vascular;;  . CHOLECYSTECTOMY    . COLONOSCOPY WITH PROPOFOL N/A 08/12/2016   Procedure: COLONOSCOPY WITH PROPOFOL;  Surgeon: Lollie Sails, MD;  Location: [facility] ENDOSCOPY;  Service: Endoscopy;  Laterality: N/A;  . DIALYSIS FISTULA CREATION Left    upper arm  . dialysis grafts    . DIALYSIS/PERMA CATHETER INSERTION N/A 11/14/2019   Procedure: DIALYSIS/PERMA CATHETER INSERTION;  Surgeon: Algernon Huxley, MD;  Location: [place] [facility] LAB;  Service: Cardiovascular;  Laterality: N/A;  . DIALYSIS/PERMA CATHETER INSERTION N/A 02/03/2020   Procedure: DIALYSIS/PERMA CATHETER INSERTION;  Surgeon: Katha Cabal, MD;  Location: [place] [facility] LAB;  Service: Cardiovascular;  Laterality: N/A;  . ESOPHAGOGASTRODUODENOSCOPY N/A 03/08/2015   Procedure: ESOPHAGOGASTRODUODENOSCOPY (EGD);  Surgeon: Manya Silvas, MD;  Location: [facility] ENDOSCOPY;  Service: Endoscopy;  Laterality: N/A;  . ESOPHAGOGASTRODUODENOSCOPY (EGD) WITH PROPOFOL N/A 03/18/2016   Procedure: ESOPHAGOGASTRODUODENOSCOPY (EGD) WITH PROPOFOL;  Surgeon: [name], MD;  Location: [facility] ENDOSCOPY;  Service: Endoscopy;  Laterality: N/A;  . EYE SURGERY Right 2018  . FECAL TRANSPLANT N/A 08/23/2015   Procedure: FECAL TRANSPLANT;  Surgeon: Manya Silvas, MD;  Location: [facility] ENDOSCOPY;  Service: Endoscopy;  Laterality: N/A;  . HAND SURGERY Bilateral   . HEMATOMA EVACUATION Left 11/25/2019   Procedure: EVACUATION HEMATOMA;  Surgeon: Katha Cabal, MD;  Location: [facility] ORS;  Service: Vascular;  Laterality: Left;  . I & D EXTREMITY Left 11/25/2019   Procedure: IRRIGATION AND DEBRIDEMENT EXTREMITY;  Surgeon: Katha Cabal, MD;  Location: [facility] ORS;  Service: Vascular;  Laterality: Left;  . IR RADIOLOGIST EVAL & MGMT  07/28/2019  . IR RADIOLOGIST EVAL & MGMT  08/11/2019  . LIGATION OF ARTERIOVENOUS  FISTULA Left 11/11/2019  . LIGATION OF ARTERIOVENOUS  FISTULA Left  11/11/2019   Procedure: LIGATION OF ARTERIOVENOUS  FISTULA;  Surgeon: Katha Cabal, MD;  Location: [facility] ORS;  Service: Vascular;  Laterality: Left;  . PERIPHERAL VASCULAR CATHETERIZATION N/A 12/20/2015   Procedure: Thrombectomy of dialysis access versus permcath placement;  Surgeon: Algernon Huxley, MD;  Location: [place] [facility] LAB;  Service: Cardiovascular;  Laterality: N/A;  . PERIPHERAL VASCULAR CATHETERIZATION N/A 12/20/2015   Procedure: A/V Shunt Intervention;  Surgeon: Algernon Huxley, MD;  Location: [place] [facility] LAB;  Service: Cardiovascular;  Laterality: N/A;  . PERIPHERAL VASCULAR CATHETERIZATION N/A 12/20/2015   Procedure: A/V Shuntogram/Fistulagram;  Surgeon: Algernon Huxley, MD;  Location: [place] [facility];  Service: Cardiovascular;  Laterality: N/A;  . PERIPHERAL VASCULAR CATHETERIZATION N/A 01/02/2016   Procedure: A/V Shuntogram/Fistulagram;  Surgeon: Algernon Huxley, MD;  Location: [place] [facility] LAB;  Service: Cardiovascular;  Laterality: N/A;  . PERIPHERAL VASCULAR CATHETERIZATION  N/A 01/02/2016   Procedure: A/V Shunt Intervention;  Surgeon: Algernon Huxley, MD;  Location: [place] [facility] LAB;  Service: Cardiovascular;  Laterality: N/A;  . UPPER EXTREMITY VENOGRAPHY Right 01/18/2020   Procedure: UPPER EXTREMITY VENOGRAPHY;  Surgeon: Katha Cabal, MD;  Location: [place] [facility];  Service: Cardiovascular;  Laterality: Right;   Family History  Problem Relation Age of Onset  . Kidney disease Mother   . Diabetes Mother   . Cancer Father   . Kidney disease Sister    Social History   Tobacco Use  . Smoking status: Never Smoker  . Smokeless tobacco: Never Used  Vaping Use  . Vaping Use: Never used  Substance Use Topics  . Alcohol use: Not Currently    Comment: glass wine week per pt  . Drug use: Yes    Types: Marijuana    Comment: once a day    Pertinent Clinical Results:  LABS: Labs reviewed: Acceptable for surgery.   NOTE: Below labs obtained from dialysis  center. Will need T&S, PT/PTT, and BMP on day of surgery.   WBC 5.1  Hemoglobin 11.1  Hematocrit 36.2  MCV 93.0  MCH 28.4  Platelets 133   Potassium 5.0  Calcium 7.8  BUN 39  Creatinine 6.93   EKG: Date: 02/08/2020 Rate: 65 bpm Rhythm: NSR Axis (leads I and aVF): Normal Intervals: PR 150 ms. QTc 438 ms. NOTE: Performed at [facility]; tracing not visible  IMAGING / PROCEDURES: LEXISCAN done on 02/24/2020 1. The study is normal. Negative lexiscan stress 2. This is a low risk study with no reversible ischemia noted 3. The left ventricular ejection fraction is normal (55-65%). 4. There was no ST segment deviation noted during stress.  ECHOCARDIOGRAM done on 02/24/2020 1. Left ventricular ejection fraction, by estimation, is 55 to 60%. Left ventricular ejection fraction by PLAX is 54 %. The left ventricle has normal function. The left ventricle has no regional wall motion abnormalities. There is mild left ventricular  hypertrophy. Left ventricular diastolic parameters were normal.  2. Right ventricular systolic function is normal. The right ventricular size is mildly enlarged. There is severely elevated pulmonary artery systolic pressure.  3. Left atrial size was mildly dilated.  4. The mitral valve is abnormal. Trivial mitral valve regurgitation.  5. Tricuspid valve regurgitation is mild to moderate.  6. The aortic valve is tricuspid. Aortic valve regurgitation is not visualized.   LEFT HEART CATHETERIZATION done on 07/26/2015 1. Ost RCA to Prox RCA lesion, 30% stenosed. The lesion was previously treated with a stent (unknown type). 2. RPDA lesion, 40% stenosed. 3. Mid Cx lesion, 30% stenosed. 4. Mid LAD to Dist LAD lesion, 30% stenosed. 5. Stent in RCA patent, no significant disease in LAD/LCX and normal LVEF  Impression and Plan:  [name] has been referred for pre-anesthesia review and clearance prior her undergoing the planned anesthetic and procedural  courses. Available labs, pertinent testing, and imaging results were personally reviewed by me. This patient has been appropriately cleared by cardiology.   Based on clinical review performed today (03/27/20), barring and significant acute changes in the patient's overall condition, it is anticipated that she will be able to proceed with the planned surgical intervention. Any acute changes in clinical condition may necessitate her procedure being postponed and/or cancelled. Pre-surgical instructions were reviewed with the patient during her PAT appointment and questions were fielded by PAT clinical staff.  Honor Loh, MSN, APRN, FNP-C, CEN [facility]  Peri-operative  Services Nurse Practitioner Phone: [phone] 03/27/20 2:02 PM  NOTE: This note has been prepared using Dragon dictation software. Despite my best ability to proofread, there is always the potential that unintentional transcriptional errors may still occur from this process.

## 2020-03-28 ENCOUNTER — Other Ambulatory Visit: Payer: Self-pay

## 2020-03-28 ENCOUNTER — Other Ambulatory Visit
Admission: RE | Admit: 2020-03-28 | Discharge: 2020-03-28 | Disposition: A | Discharge status: Home / Self Care | Payer: Medicare Other | Source: Ambulatory Visit | Attending: Vascular Surgery | Admitting: Vascular Surgery

## 2020-03-28 DIAGNOSIS — Z20822 Contact with and (suspected) exposure to covid-19: Secondary | ICD-10-CM | POA: Diagnosis not present

## 2020-03-28 DIAGNOSIS — Z01812 Encounter for preprocedural laboratory examination: Secondary | ICD-10-CM | POA: Diagnosis present

## 2020-03-28 LAB — SARS CORONAVIRUS 2 (TAT 6-24 HRS): SARS Coronavirus 2: NEGATIVE

## 2020-03-28 LAB — SURGICAL PCR SCREEN
MRSA, PCR: NEGATIVE
Staphylococcus aureus: NEGATIVE

## 2020-03-29 ENCOUNTER — Other Ambulatory Visit
Admission: RE | Admit: 2020-03-29 | Discharge: 2020-03-29 | Disposition: A | Discharge status: Home / Self Care | Payer: Medicare Other | Source: Other Acute Inpatient Hospital | Attending: Nephrology | Admitting: Nephrology

## 2020-03-29 LAB — PROTIME-INR
INR: 1.1 (ref 0.8–1.2)
Prothrombin Time: 13.3 seconds (ref 11.4–15.2)

## 2020-03-29 LAB — APTT: aPTT: 40 seconds — ABNORMAL HIGH (ref 24–36)

## 2020-03-29 LAB — BASIC METABOLIC PANEL
Anion gap: 13 (ref 5–15)
BUN: 27 mg/dL — ABNORMAL HIGH (ref 6–20)
CO2: 25 mmol/L (ref 22–32)
Calcium: 8.2 mg/dL — ABNORMAL LOW (ref 8.9–10.3)
Chloride: 95 mmol/L — ABNORMAL LOW (ref 98–111)
Creatinine, Ser: 4.53 mg/dL — ABNORMAL HIGH (ref 0.44–1.00)
GFR calc Af Amer: 11 mL/min — ABNORMAL LOW (ref >60)
GFR calc non Af Amer: 10 mL/min — ABNORMAL LOW (ref >60)
Glucose, Bld: 125 mg/dL — ABNORMAL HIGH (ref 70–99)
Potassium: 3.9 mmol/L (ref 3.5–5.1)
Sodium: 133 mmol/L — ABNORMAL LOW (ref 135–145)

## 2020-03-29 LAB — CBC WITH DIFFERENTIAL/PLATELET
Abs Immature Granulocytes: 0.01 10*3/uL (ref 0.00–0.07)
Basophils Absolute: 0 10*3/uL (ref 0.0–0.1)
Basophils Relative: 1 %
Eosinophils Absolute: 0.6 10*3/uL — ABNORMAL HIGH (ref 0.0–0.5)
Eosinophils Relative: 14 %
HCT: 32.7 % — ABNORMAL LOW (ref 36.0–46.0)
Hemoglobin: 10.8 g/dL — ABNORMAL LOW (ref 12.0–15.0)
Immature Granulocytes: 0 %
Lymphocytes Relative: 40 %
Lymphs Abs: 1.8 10*3/uL (ref 0.7–4.0)
MCH: 28.5 pg (ref 26.0–34.0)
MCHC: 33 g/dL (ref 30.0–36.0)
MCV: 86.3 fL (ref 80.0–100.0)
Monocytes Absolute: 0.4 10*3/uL (ref 0.1–1.0)
Monocytes Relative: 10 %
Neutro Abs: 1.5 10*3/uL — ABNORMAL LOW (ref 1.7–7.7)
Neutrophils Relative %: 35 %
Platelets: 148 10*3/uL — ABNORMAL LOW (ref 150–400)
RBC: 3.79 MIL/uL — ABNORMAL LOW (ref 3.87–5.11)
RDW: 16.2 % — ABNORMAL HIGH (ref 11.5–15.5)
WBC: 4.4 10*3/uL (ref 4.0–10.5)
nRBC: 0 % (ref 0.0–0.2)

## 2020-03-30 ENCOUNTER — Ambulatory Visit
Admission: RE | Admit: 2020-03-30 | Discharge: 2020-03-30 | Disposition: A | Discharge status: Home / Self Care | Payer: Medicare Other | Attending: Vascular Surgery | Admitting: Vascular Surgery

## 2020-03-31 IMAGING — CT CT HAND*L* W/O CM
2 of 3 series · 12 of 33 positions shown, 15 images · non-contrast
Comparison: None.

CLINICAL DATA: End-stage renal disease on hemodialysis with soft
tissue swelling involving the left upper extremity. Patient had an
infected left arm brachial artery AV graft

EXAM:
CT OF THE UPPER LEFT EXTREMITY WITHOUT CONTRAST
TECHNIQUE: Multidetector CT imaging of the upper left extremity was performed
according to the standard protocol.

[Series 9: ax st · axial · 0.55mm/px · z∈[-502,+86]mm · 9 of 464 slices shown, 12 images]
[im 36/464  soft-tissue]
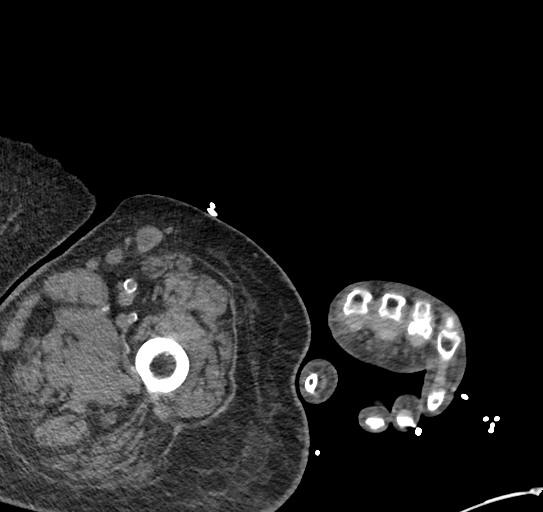
[im 36/464  bone]
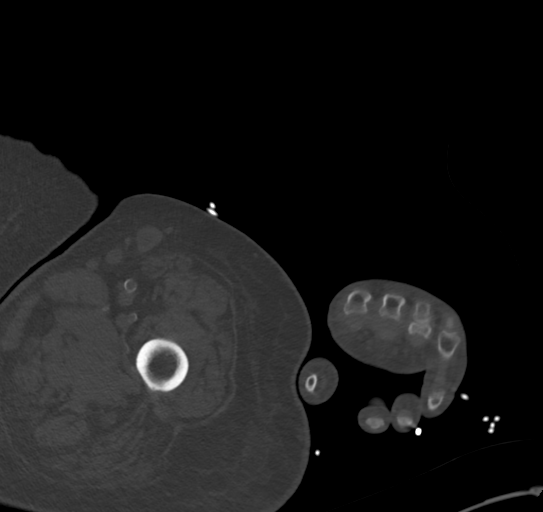
[im 107/464  bone]
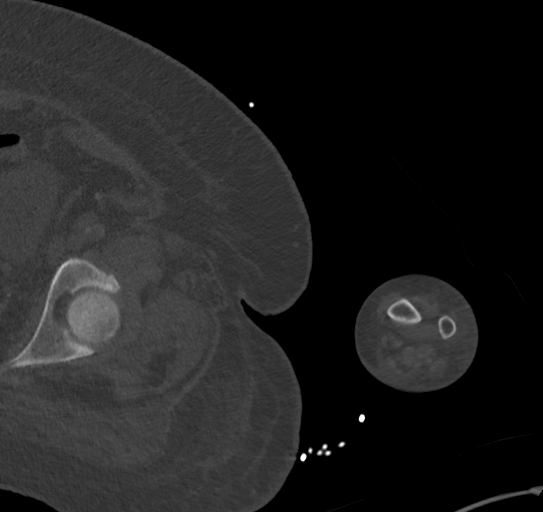
[im 143/464  bone]
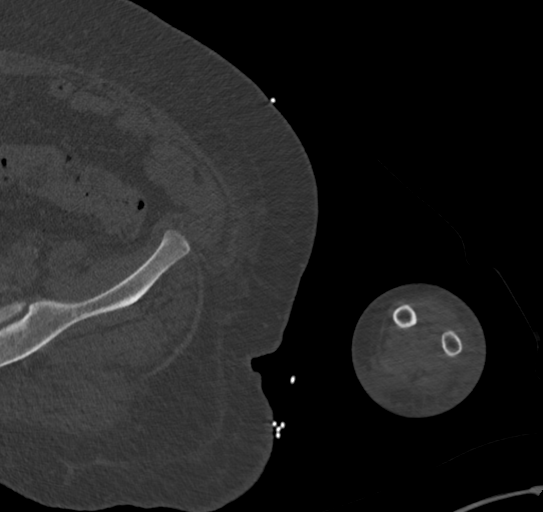
[im 179/464  bone]
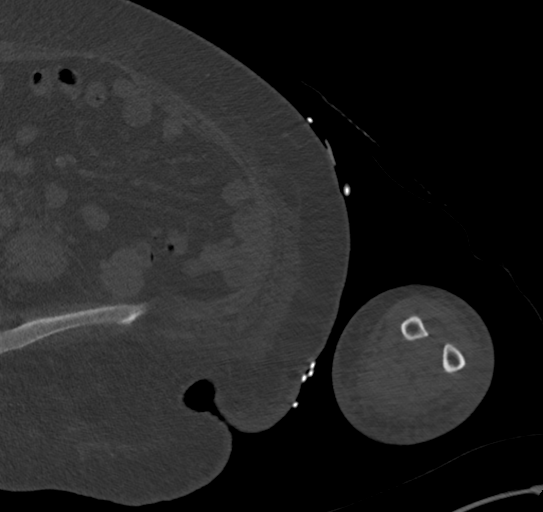
[im 250/464  soft-tissue]
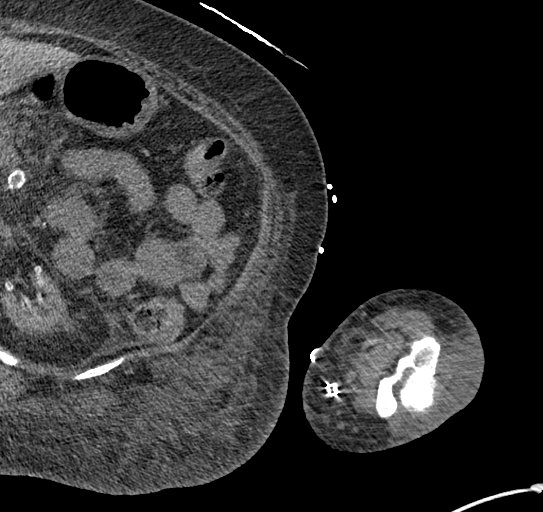
[im 250/464  bone]
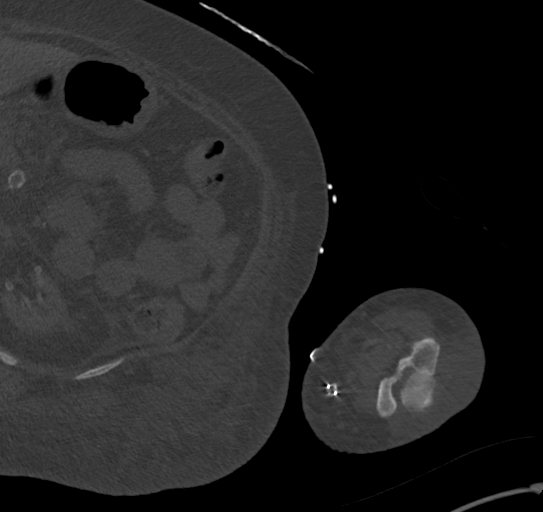
[im 285/464  bone]
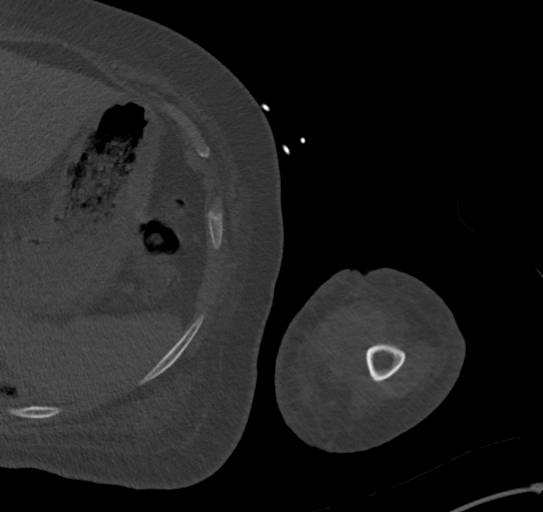
[im 321/464  bone]
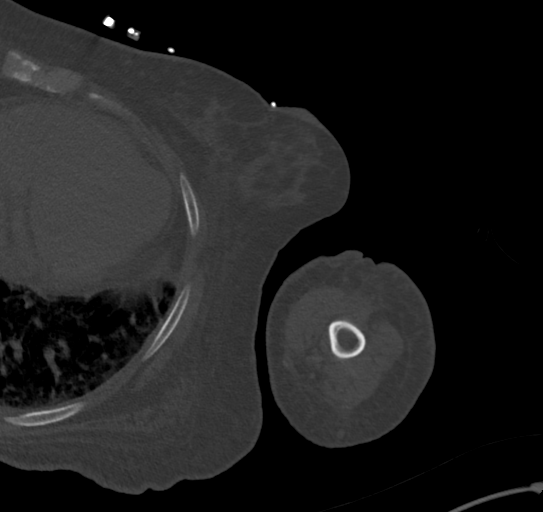
[im 392/464  bone]
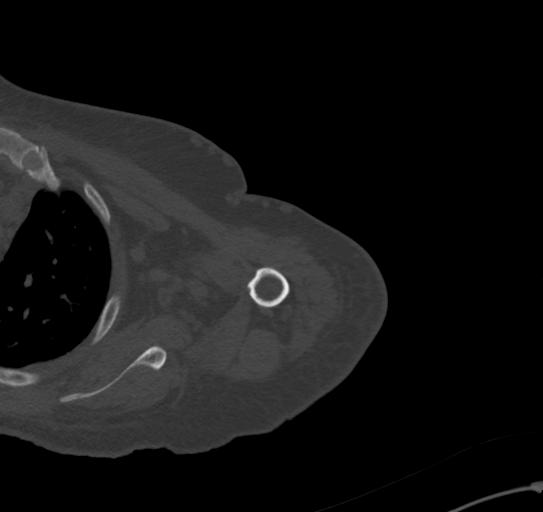
[im 428/464  soft-tissue]
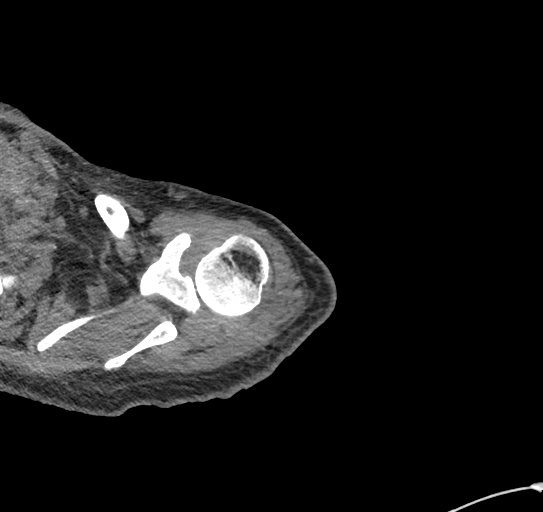
[im 428/464  bone]
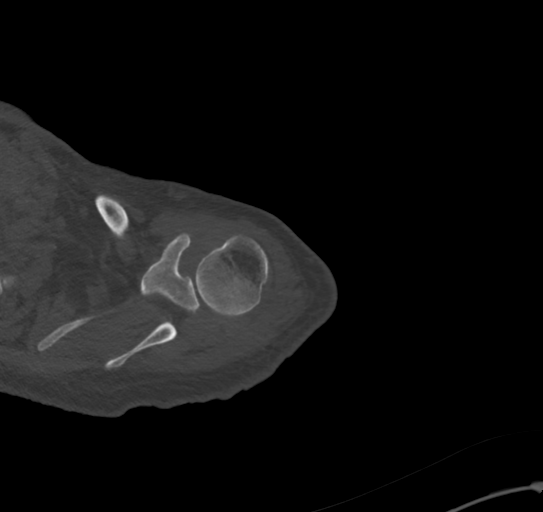

[Series 10: cor st · coronal · 0.59mm/px · 3 of 189 slices shown]
[im 38/189  bone]
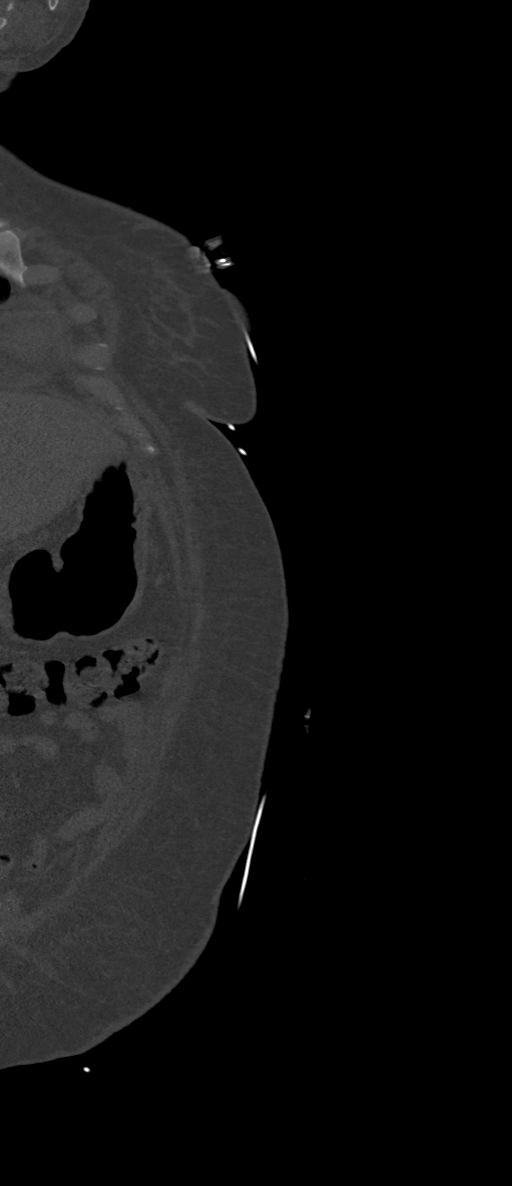
[im 76/189  bone]
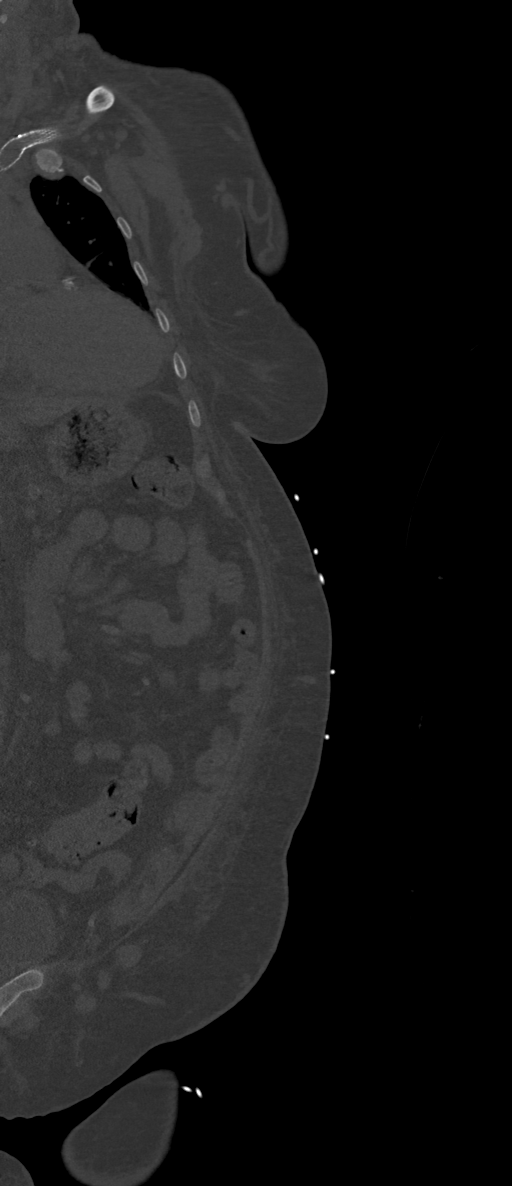
[im 113/189  bone]
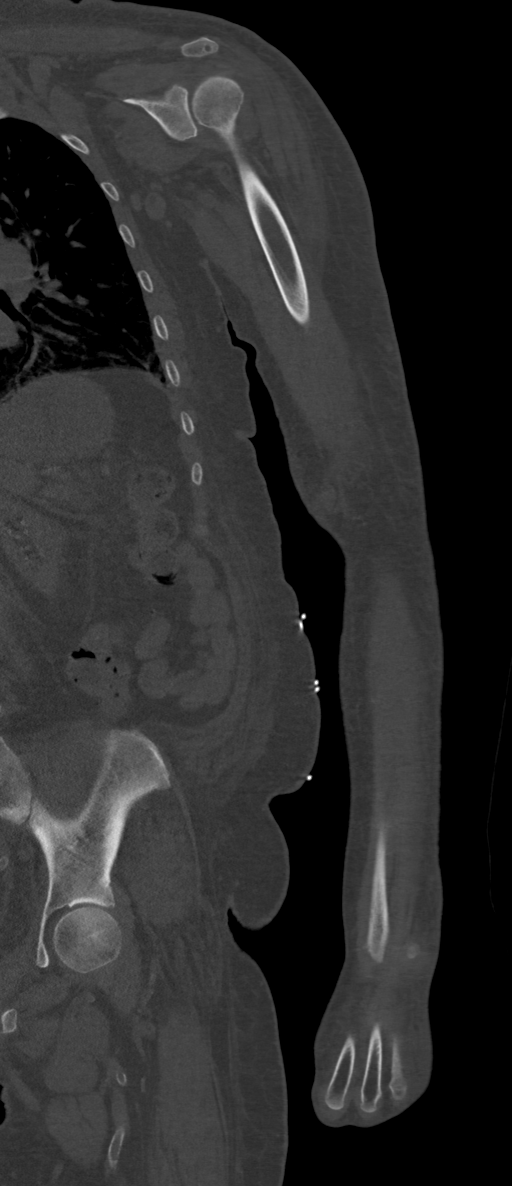

[12 of 33 positions shown; findings below may reference images not displayed]

FINDINGS: There is a moderate size subcutaneous hematoma noted in the anterior
aspect of the left upper arm. This measures 4.5 x 3.0 cm. There is a
small hematoma just below this.

Open wound is noted along the anterior aspect of the upper arm.
There is diffuse subcutaneous soft tissue swelling/edema/fluid
involving the entire left upper extremity. I do not see a discrete
drainable soft tissue abscess. Suspect diffuse myofasciitis without
definite findings for pyomyositis.

No findings suspicious for septic arthritis or osteomyelitis.
Suspect remote distal radius fracture with volar impaction.
IMPRESSION: 1. Diffuse subcutaneous soft tissue swelling/edema/fluid involving
the entire left upper extremity without discrete drainable soft
tissue abscess.
2. Diffuse myofasciitis without definite findings for pyomyositis.
3. Moderate-sized subcutaneous hematoma involving the anterior
aspect of the upper arm.

## 2020-03-31 IMAGING — CT CT FOREARM*L* W/O CM
2 of 3 series · 12 of 33 positions shown, 15 images · non-contrast
Comparison: None.

CLINICAL DATA: End-stage renal disease on hemodialysis with soft
tissue swelling involving the left upper extremity. Patient had an
infected left arm brachial artery AV graft

EXAM:
CT OF THE UPPER LEFT EXTREMITY WITHOUT CONTRAST
TECHNIQUE: Multidetector CT imaging of the upper left extremity was performed
according to the standard protocol.

[Series 506: ax st · axial · 0.55mm/px · z∈[-502,+86]mm · 9 of 464 slices shown, 12 images]
[im 36/464  soft-tissue]
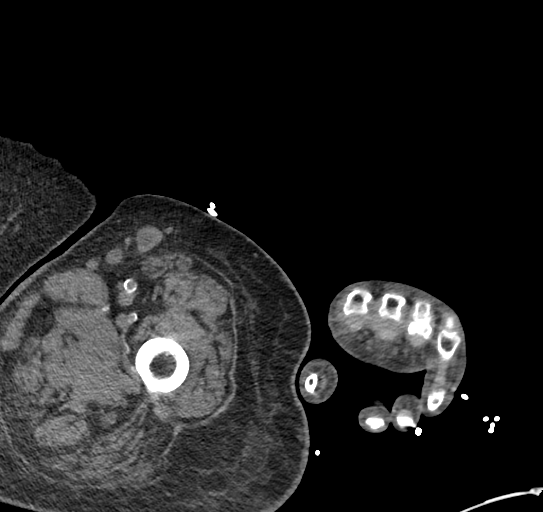
[im 36/464  bone]
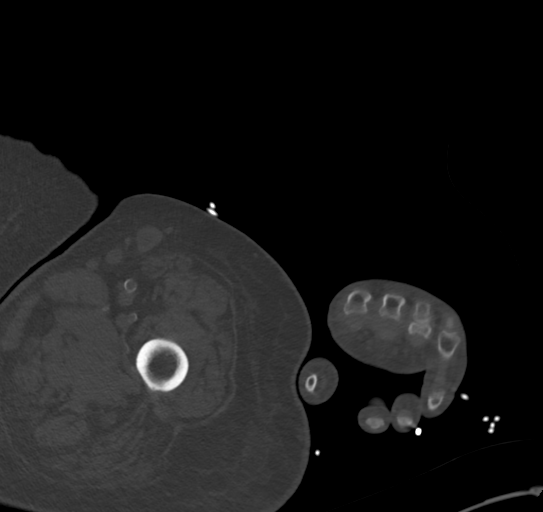
[im 107/464  bone]
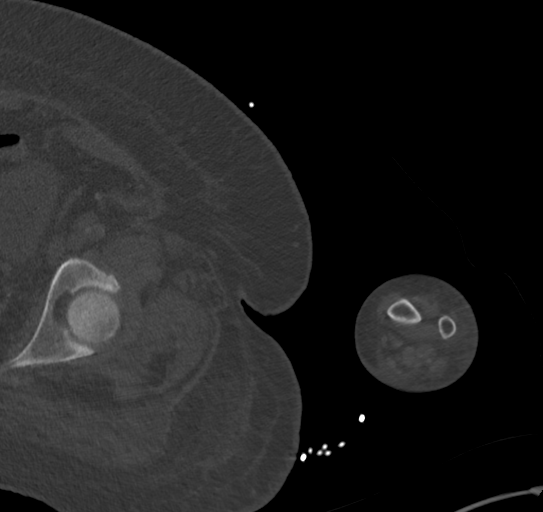
[im 143/464  bone]
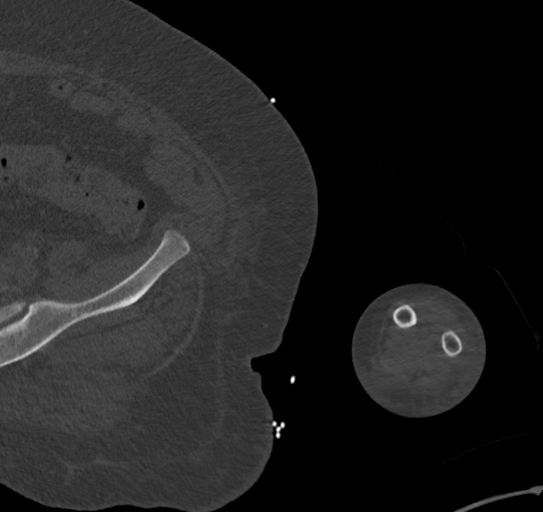
[im 179/464  bone]
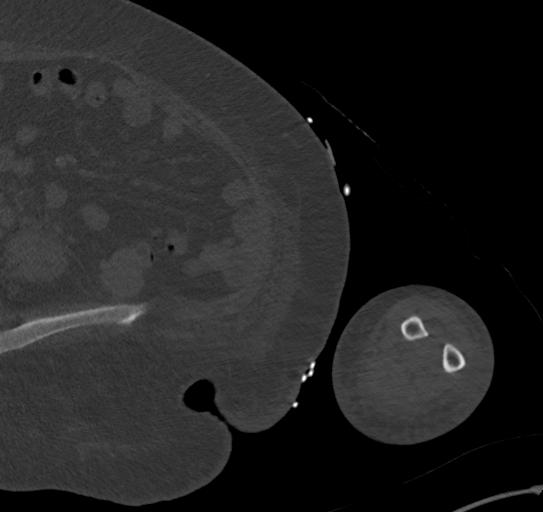
[im 250/464  soft-tissue]
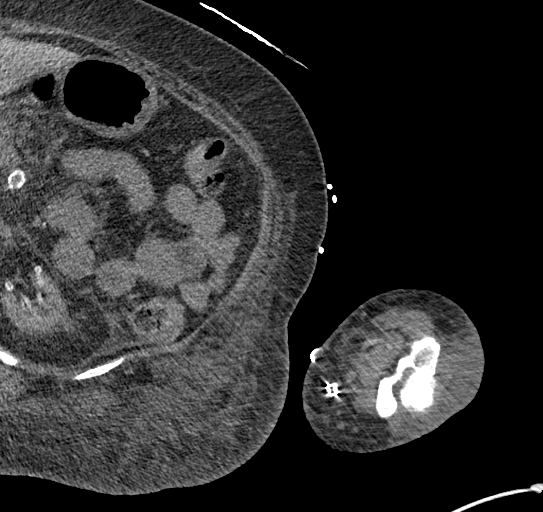
[im 250/464  bone]
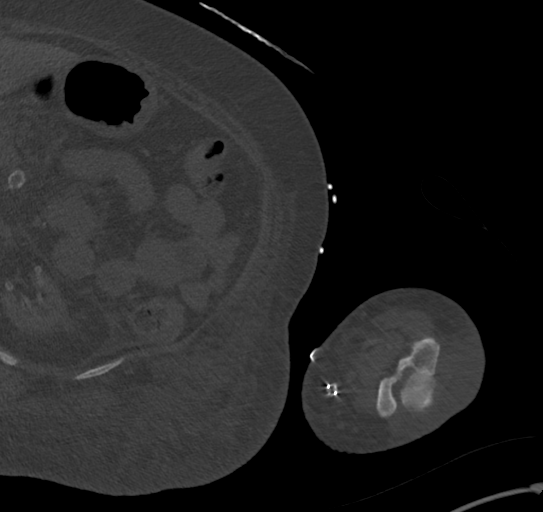
[im 285/464  bone]
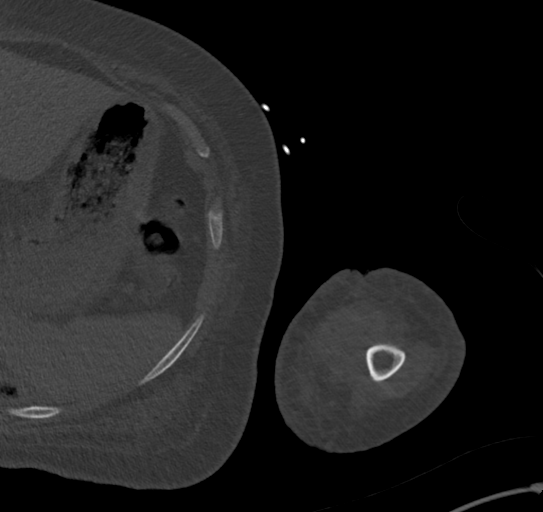
[im 321/464  bone]
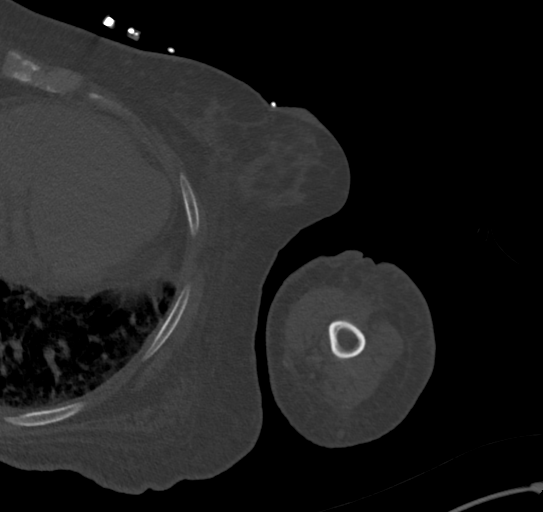
[im 392/464  bone]
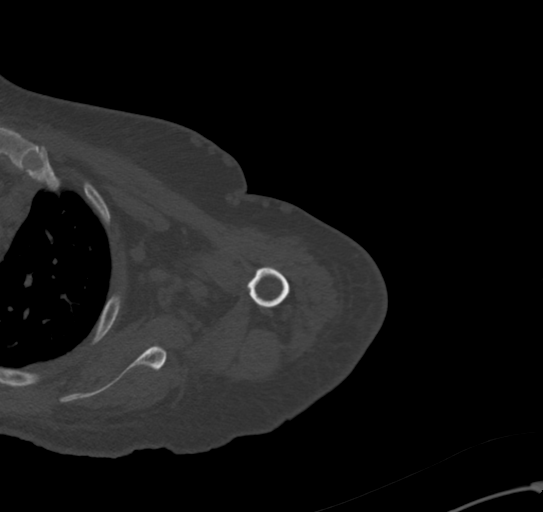
[im 428/464  soft-tissue]
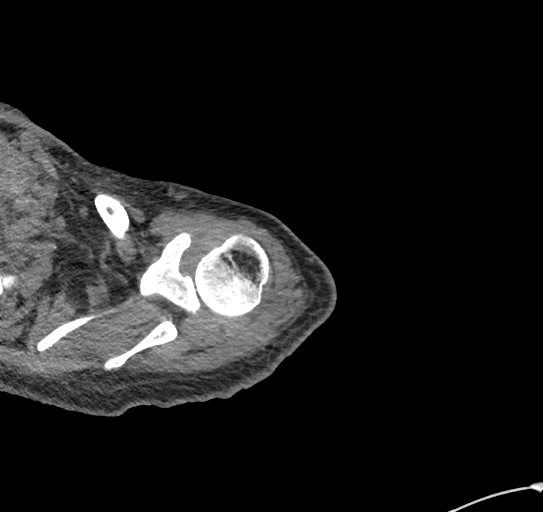
[im 428/464  bone]
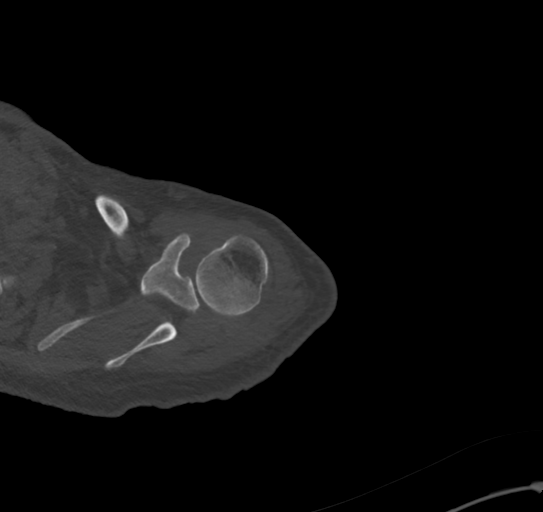

[Series 507: cor st · coronal · 0.59mm/px · 3 of 189 slices shown]
[im 38/189  bone]
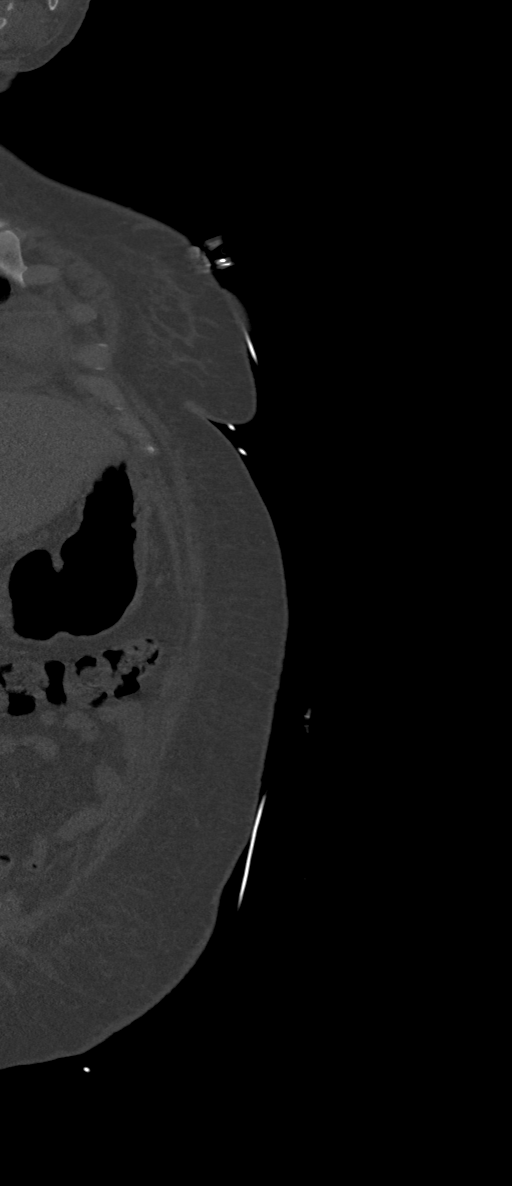
[im 76/189  bone]
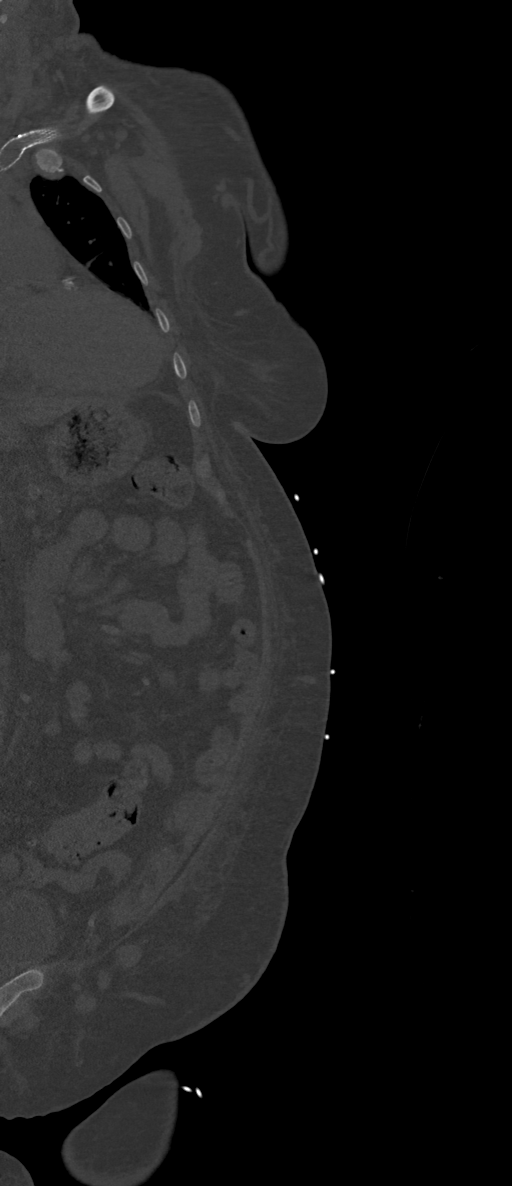
[im 113/189  bone]
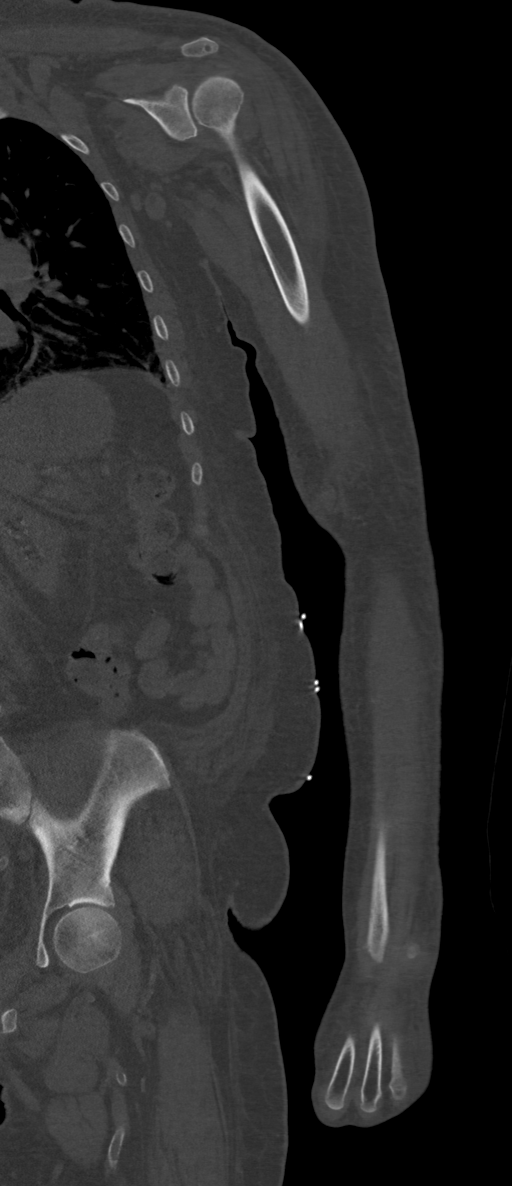

[12 of 33 positions shown; findings below may reference images not displayed]

FINDINGS: There is a moderate size subcutaneous hematoma noted in the anterior
aspect of the left upper arm. This measures 4.5 x 3.0 cm. There is a
small hematoma just below this.

Open wound is noted along the anterior aspect of the upper arm.
There is diffuse subcutaneous soft tissue swelling/edema/fluid
involving the entire left upper extremity. I do not see a discrete
drainable soft tissue abscess. Suspect diffuse myofasciitis without
definite findings for pyomyositis.

No findings suspicious for septic arthritis or osteomyelitis.
Suspect remote distal radius fracture with volar impaction.
IMPRESSION: 1. Diffuse subcutaneous soft tissue swelling/edema/fluid involving
the entire left upper extremity without discrete drainable soft
tissue abscess.
2. Diffuse myofasciitis without definite findings for pyomyositis.
3. Moderate-sized subcutaneous hematoma involving the anterior
aspect of the upper arm.

## 2020-04-02 IMAGING — US US EXTREM LOW VENOUS
1 series · 14 of 24 positions shown · non-contrast
Comparison: None.

CLINICAL DATA: Leg pain, swelling

EXAM:
BILATERAL LOWER EXTREMITY VENOUS DOPPLER ULTRASOUND
TECHNIQUE: Gray-scale sonography with compression, as well as color and duplex
ultrasound, were performed to evaluate the deep venous system(s)
from the level of the common femoral vein through the popliteal and
proximal calf veins.

[Series 1: us venous img lower bilat (dvt) · portal-venous · 14 of 58 slices shown]
[im 1/58]
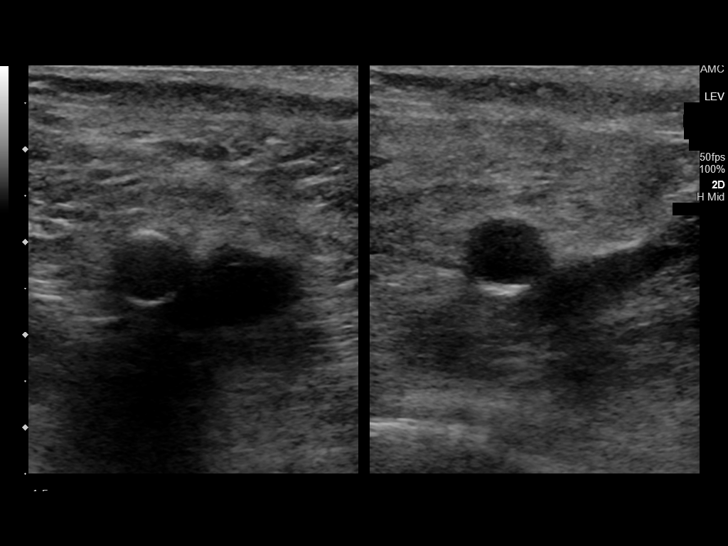
[im 5/58]
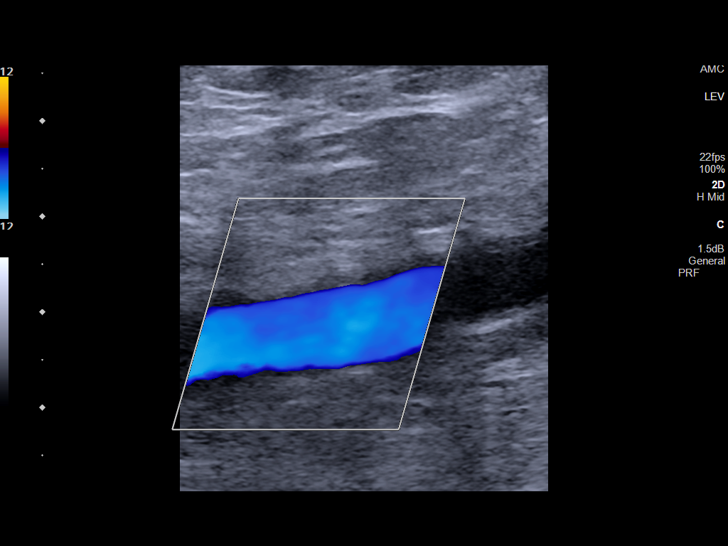
[im 10/58]
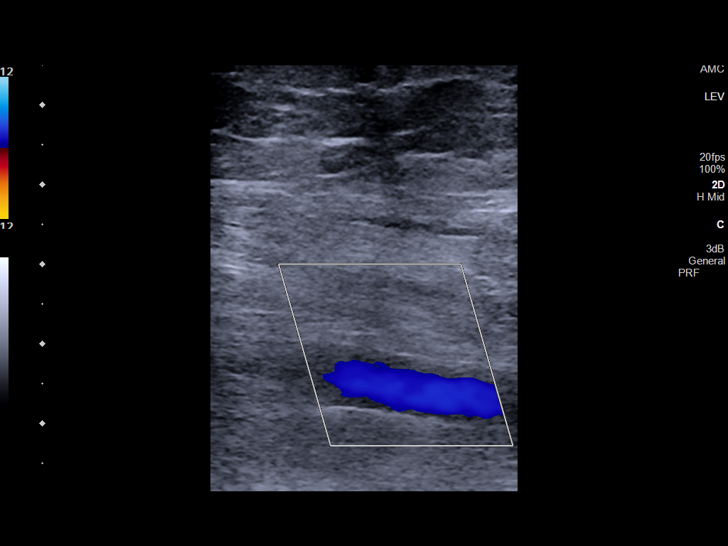
[im 15/58]
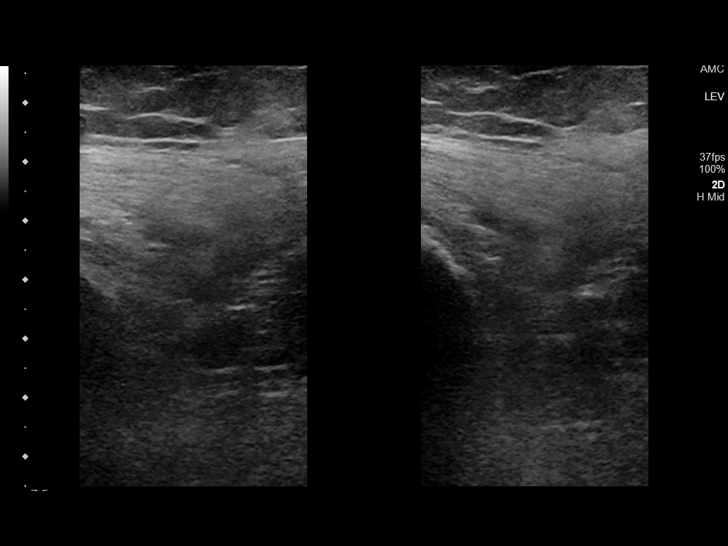
[im 18/58]
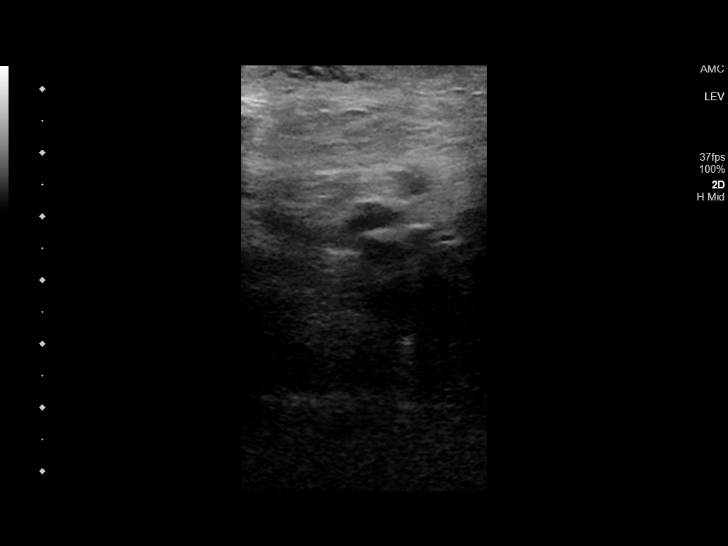
[im 23/58]
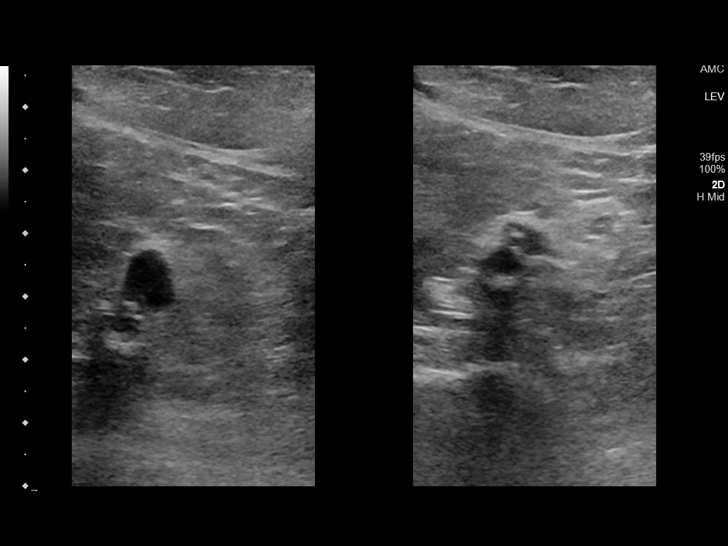
[im 28/58]
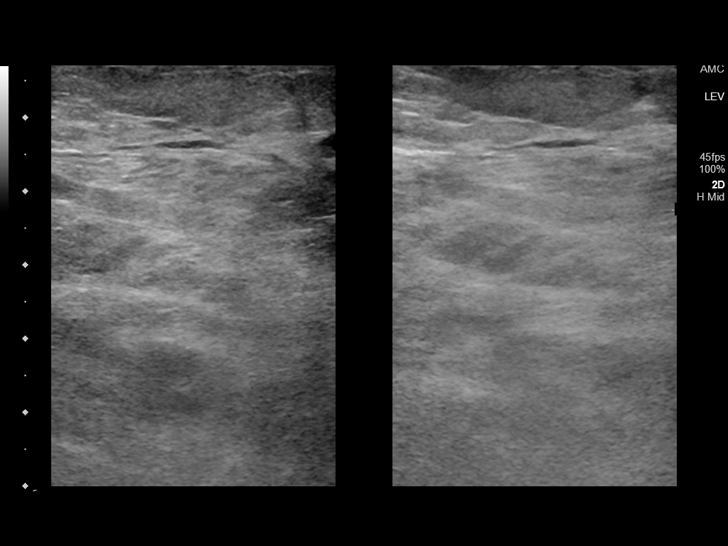
[im 30/58]
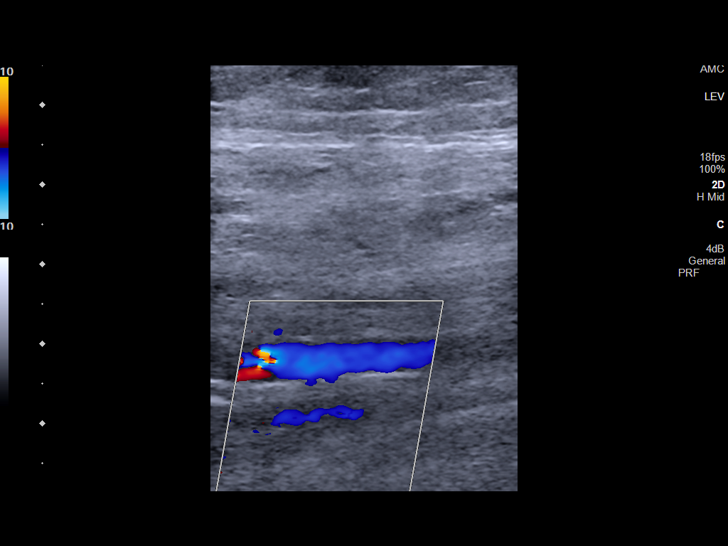
[im 35/58]
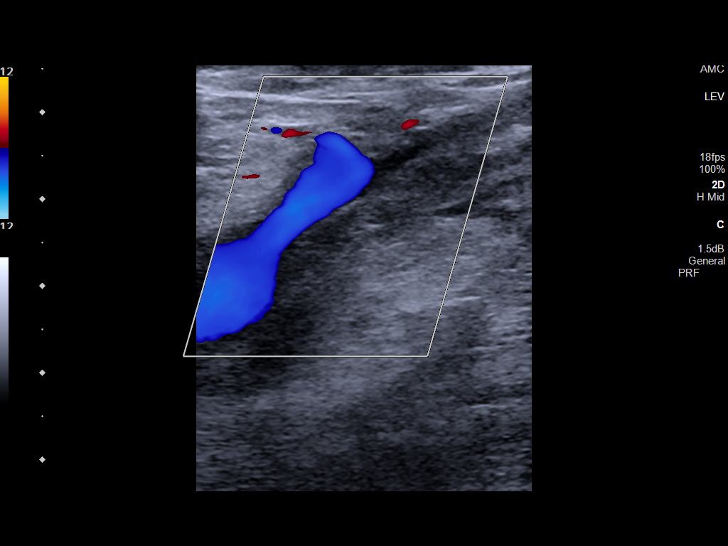
[im 40/58]
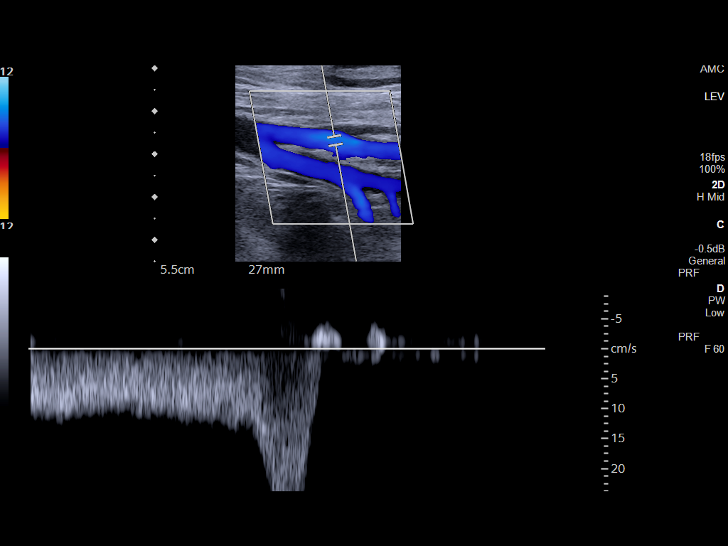
[im 45/58]
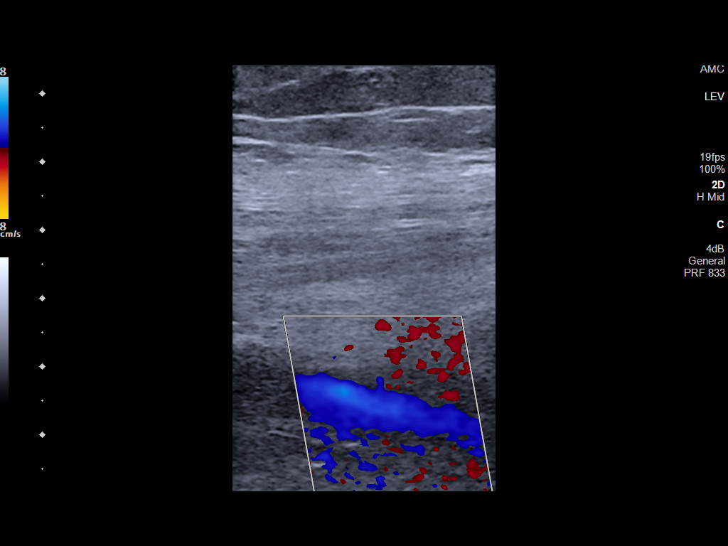
[im 48/58]
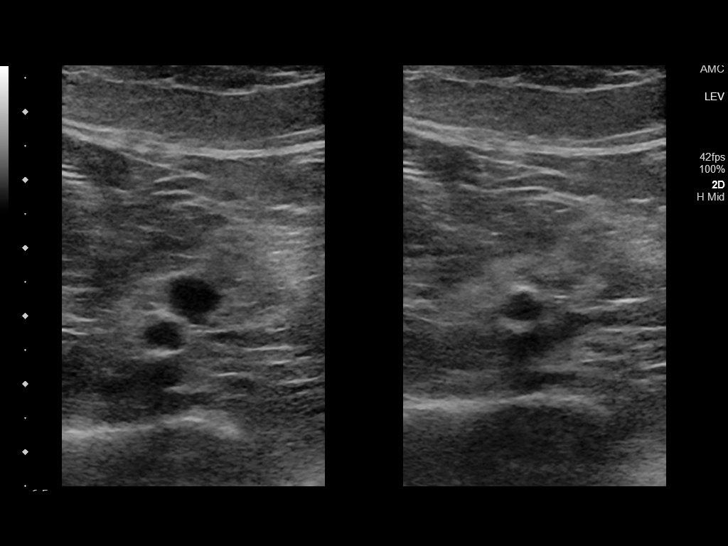
[im 53/58]
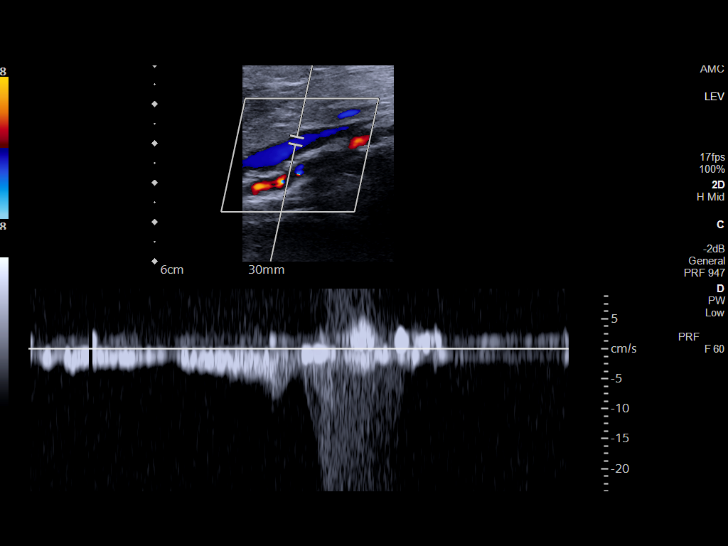
[im 58/58]
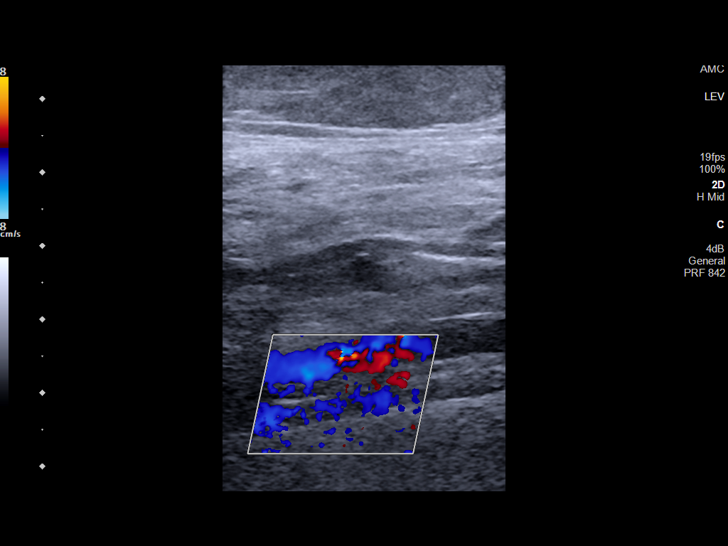

[14 of 24 positions shown; findings below may reference images not displayed]

FINDINGS: VENOUS

Normal compressibility of the common femoral, superficial femoral,
and popliteal veins, as well as the visualized calf veins.
Visualized portions of profunda femoral vein and great saphenous
vein unremarkable. No filling defects to suggest DVT on grayscale or
color Doppler imaging. Doppler waveforms show normal direction of
venous flow, normal respiratory phasicity and response to
augmentation.

OTHER

None.

Limitations: none
IMPRESSION: No evidence of lower extremity DVT.

## 2020-04-03 ENCOUNTER — Telehealth (INDEPENDENT_AMBULATORY_CARE_PROVIDER_SITE_OTHER): Payer: Self-pay

## 2020-04-03 NOTE — Telephone Encounter (Signed)
Spoke with the patient and she has been rescheduled for her right HERO graft insertion with Dr. Delana Meyer on 04/13/20. Patient will do covid testing on 04/10/20 between 8-1 pm, this is due to her transportation needs.

## 2020-04-06 ENCOUNTER — Ambulatory Visit
Admission: RE | Admit: 2020-04-06 | Discharge: 2020-04-06 | Disposition: A | Discharge status: Home / Self Care | Payer: Medicare Other | Source: Ambulatory Visit | Attending: Interventional Radiology | Admitting: Interventional Radiology

## 2020-04-06 ENCOUNTER — Other Ambulatory Visit: Payer: Self-pay

## 2020-04-06 DIAGNOSIS — Z87898 Personal history of other specified conditions: Secondary | ICD-10-CM

## 2020-04-06 DIAGNOSIS — N2889 Other specified disorders of kidney and ureter: Secondary | ICD-10-CM | POA: Insufficient documentation

## 2020-04-06 DIAGNOSIS — I878 Other specified disorders of veins: Secondary | ICD-10-CM | POA: Insufficient documentation

## 2020-04-06 HISTORY — PX: IR FLUORO GUIDE CV LINE RIGHT: IMG2283

## 2020-04-06 MED ORDER — IOHEXOL 300 MG/ML  SOLN
100.0000 mL | Freq: Once | INTRAMUSCULAR | Status: AC | PRN
  Administered 2020-04-06: 100 mL via INTRAVENOUS

## 2020-04-06 NOTE — Procedures (Signed)
Pt scheduled for CT with IV contrast today. Poor IV access due to numerous UE AVF/Graft procedures. Most recently (L)UE AVG explantation. Scheduled for new (R)UE Hero graft next week. (L)UE chosen for Iv access to avoid potential damage/DVT of (R)UE venous system prior to surgery.  Successful US guided placement of 5 Fr micropuncture cathter to (L)brachial vein. Excellent return and flush.   Ascencion Dike PA-C Interventional Radiology 04/06/2020 8:42 AM

## 2020-04-09 ENCOUNTER — Other Ambulatory Visit (INDEPENDENT_AMBULATORY_CARE_PROVIDER_SITE_OTHER): Payer: Self-pay | Admitting: Nurse Practitioner

## 2020-04-10 ENCOUNTER — Other Ambulatory Visit: Payer: Self-pay

## 2020-04-10 ENCOUNTER — Telehealth: Payer: Self-pay | Admitting: Interventional Radiology

## 2020-04-10 ENCOUNTER — Telehealth: Payer: Medicare Other

## 2020-04-10 ENCOUNTER — Other Ambulatory Visit
Admission: RE | Admit: 2020-04-10 | Discharge: 2020-04-10 | Disposition: A | Discharge status: Home / Self Care | Payer: Medicare Other | Source: Ambulatory Visit | Attending: Vascular Surgery | Admitting: Vascular Surgery

## 2020-04-10 ENCOUNTER — Ambulatory Visit
Admission: RE | Admit: 2020-04-10 | Discharge: 2020-04-10 | Disposition: A | Discharge status: Home / Self Care | Payer: Medicare Other | Source: Ambulatory Visit | Attending: Interventional Radiology | Admitting: Interventional Radiology

## 2020-04-10 DIAGNOSIS — N2889 Other specified disorders of kidney and ureter: Secondary | ICD-10-CM

## 2020-04-10 DIAGNOSIS — Z20822 Contact with and (suspected) exposure to covid-19: Secondary | ICD-10-CM | POA: Diagnosis not present

## 2020-04-10 DIAGNOSIS — Z01812 Encounter for preprocedural laboratory examination: Secondary | ICD-10-CM | POA: Diagnosis present

## 2020-04-10 LAB — SARS CORONAVIRUS 2 (TAT 6-24 HRS): SARS Coronavirus 2: NEGATIVE

## 2020-04-10 NOTE — Telephone Encounter (Signed)
Telemedicine consultation was arranged for today at 1300 following acquisition of surveillance renal protocol CT scan of the abdomen pelvis performed 04/06/2020 however patient did not answer her phone.   (Chart review demonstraes she is scheduled to undergo hero graft implantation this week.)  Telephone encounter for documentation purposes as follows:  Chief Complaint: Indeterminate right-sided renal lesion  Referring Physician(s): Erlene Quan [facility])  History of Present Illness: [name] a 59 y.o.femalewith multiple medical comorbidities including chronic diastolic CHF, COPD, CAD (post coronary stent placement), diabetes,and end-stage renal disease (on dialysis Monday, Wednesday and Friday), hypertension and hyperlipidemia who was initially seen in consultation for consideration of percutaneous management of indeterminate right-sided renal lesions via telemedicine consultation on 07/28/2019.   Personal review of CT scan of the abdomen pelvis performed 04/06/2020 and remote contrast-enhanced abdominal CT performed 07/31/2016 demonstrates the following:  Right superior medial lesion - 1.3 x 1.2 cm, previously, 1.3 x 1.1 cm (when compared to the 07/2016 examination).  Right posterior superior lesion - 1.2 x 1.0 cm, previously, 1.1 x 0.9 cm (when compared to the 07/2016 examination).  Additional previously questioned punctate (approximate 0.6 cm) hyperenhancing lesion involving the more anterior aspect of the superior pole of the right kidney is not definitively seen on the present examination potentially secondary to different phase of arterial enhancement.  There is no evidence of metastatic disease within the abdomen or pelvis. _____________________________________________________  While technically both lesions are amenable cryoablation given continued stability of these lesions since at least 07/2016 as well as the patient's multiple medical comorbidities, I would defer  dedicated intervention at this time and recommend continued surveillance.    As such, would proceed with contrast-enhanced renal protocol CT scan in 1 year (July 2022).  Again, given patient's poor intravenous access, would again arrange for the patient to undergo ultrasound-guided temporary IV access prior to the contrast-enhanced abdominal CT.  Plan:  - Renal protocol CT scan in 1 year (July 2022), with ultrasound-guided temporary IV to be placed by IR prior to the CT.  Ronny Bacon, MD Pager #: [phone]

## 2020-04-13 ENCOUNTER — Ambulatory Visit: Payer: Medicare Other | Admitting: Anesthesiology

## 2020-04-13 ENCOUNTER — Ambulatory Visit: Payer: Medicare Other

## 2020-04-13 ENCOUNTER — Other Ambulatory Visit: Payer: Self-pay

## 2020-04-13 ENCOUNTER — Encounter: Payer: Self-pay | Admitting: Vascular Surgery

## 2020-04-13 ENCOUNTER — Observation Stay
Admission: RE | Admit: 2020-04-13 | Discharge: 2020-04-14 | Disposition: A | Discharge status: Home / Self Care | Payer: Medicare Other | Attending: Vascular Surgery | Admitting: Vascular Surgery

## 2020-04-13 ENCOUNTER — Encounter
Admission: RE | Disposition: A | Discharge status: Home / Self Care | Payer: Self-pay | Source: Home / Self Care | Attending: Vascular Surgery

## 2020-04-13 DIAGNOSIS — Z79899 Other long term (current) drug therapy: Secondary | ICD-10-CM | POA: Insufficient documentation

## 2020-04-13 DIAGNOSIS — I5032 Chronic diastolic (congestive) heart failure: Secondary | ICD-10-CM | POA: Insufficient documentation

## 2020-04-13 DIAGNOSIS — N186 End stage renal disease: Secondary | ICD-10-CM | POA: Diagnosis present

## 2020-04-13 DIAGNOSIS — E119 Type 2 diabetes mellitus without complications: Secondary | ICD-10-CM | POA: Diagnosis not present

## 2020-04-13 DIAGNOSIS — I2510 Atherosclerotic heart disease of native coronary artery without angina pectoris: Secondary | ICD-10-CM | POA: Diagnosis not present

## 2020-04-13 DIAGNOSIS — J45909 Unspecified asthma, uncomplicated: Secondary | ICD-10-CM | POA: Insufficient documentation

## 2020-04-13 DIAGNOSIS — Z992 Dependence on renal dialysis: Secondary | ICD-10-CM

## 2020-04-13 DIAGNOSIS — I132 Hypertensive heart and chronic kidney disease with heart failure and with stage 5 chronic kidney disease, or end stage renal disease: Secondary | ICD-10-CM | POA: Diagnosis present

## 2020-04-13 DIAGNOSIS — Z794 Long term (current) use of insulin: Secondary | ICD-10-CM | POA: Diagnosis not present

## 2020-04-13 DIAGNOSIS — J449 Chronic obstructive pulmonary disease, unspecified: Secondary | ICD-10-CM | POA: Diagnosis not present

## 2020-04-13 DIAGNOSIS — I871 Compression of vein: Secondary | ICD-10-CM | POA: Insufficient documentation

## 2020-04-13 DIAGNOSIS — Z7982 Long term (current) use of aspirin: Secondary | ICD-10-CM | POA: Insufficient documentation

## 2020-04-13 DIAGNOSIS — Z419 Encounter for procedure for purposes other than remedying health state, unspecified: Secondary | ICD-10-CM

## 2020-04-13 DIAGNOSIS — Z7901 Long term (current) use of anticoagulants: Secondary | ICD-10-CM | POA: Diagnosis not present

## 2020-04-13 DIAGNOSIS — T82898A Other specified complication of vascular prosthetic devices, implants and grafts, initial encounter: Secondary | ICD-10-CM | POA: Diagnosis not present

## 2020-04-13 HISTORY — PX: VASCULAR ACCESS DEVICE INSERTION: SHX5158

## 2020-04-13 LAB — BASIC METABOLIC PANEL
Anion gap: 14 (ref 5–15)
BUN: 21 mg/dL — ABNORMAL HIGH (ref 6–20)
CO2: 24 mmol/L (ref 22–32)
Calcium: 8.4 mg/dL — ABNORMAL LOW (ref 8.9–10.3)
Chloride: 99 mmol/L (ref 98–111)
Creatinine, Ser: 5.3 mg/dL — ABNORMAL HIGH (ref 0.44–1.00)
GFR calc Af Amer: 9 mL/min — ABNORMAL LOW (ref >60)
GFR calc non Af Amer: 8 mL/min — ABNORMAL LOW (ref >60)
Glucose, Bld: 67 mg/dL — ABNORMAL LOW (ref 70–99)
Potassium: 4.8 mmol/L (ref 3.5–5.1)
Sodium: 137 mmol/L (ref 135–145)

## 2020-04-13 LAB — GLUCOSE, CAPILLARY
Glucose-Capillary: 65 mg/dL — ABNORMAL LOW (ref 70–99)
Glucose-Capillary: 111 mg/dL — ABNORMAL HIGH (ref 70–99)
Glucose-Capillary: 235 mg/dL — ABNORMAL HIGH (ref 70–99)
Glucose-Capillary: 237 mg/dL — ABNORMAL HIGH (ref 70–99)
Glucose-Capillary: 285 mg/dL — ABNORMAL HIGH (ref 70–99)

## 2020-04-13 LAB — TYPE AND SCREEN
ABO/RH(D): O POS
Antibody Screen: NEGATIVE

## 2020-04-13 LAB — APTT: aPTT: 35 seconds (ref 24–36)

## 2020-04-13 LAB — PROTIME-INR
INR: 1 (ref 0.8–1.2)
Prothrombin Time: 12.6 seconds (ref 11.4–15.2)

## 2020-04-13 SURGERY — INSERTION, CATHETER, HERO
Anesthesia: General | Laterality: Right

## 2020-04-13 MED ORDER — PANCRELIPASE (LIP-PROT-AMYL) 12000-38000 UNITS PO CPEP
36000.0000 [IU] | ORAL_CAPSULE | Freq: Three times a day (TID) | ORAL | Status: DC
  Administered 2020-04-13 – 2020-04-14 (×3): 36000 [IU] via ORAL
  Filled 2020-04-13 (×5): qty 3

## 2020-04-13 MED ORDER — DEXTROSE-NACL 5-0.9 % IV SOLN: INTRAVENOUS | Status: DC

## 2020-04-13 MED ORDER — FENTANYL CITRATE (PF) 100 MCG/2ML IJ SOLN
INTRAMUSCULAR | Status: DC | PRN
  Administered 2020-04-13 (×2): 25 ug via INTRAVENOUS

## 2020-04-13 MED ORDER — CHLORHEXIDINE GLUCONATE CLOTH 2 % EX PADS: 6.0000 | MEDICATED_PAD | Freq: Once | CUTANEOUS | Status: DC

## 2020-04-13 MED ORDER — CHLORHEXIDINE GLUCONATE CLOTH 2 % EX PADS
6.0000 | MEDICATED_PAD | Freq: Every day | CUTANEOUS | Status: DC
  Administered 2020-04-14: 6 via TOPICAL

## 2020-04-13 MED ORDER — PANTOPRAZOLE SODIUM 40 MG PO TBEC
40.0000 mg | DELAYED_RELEASE_TABLET | Freq: Every day | ORAL | Status: DC
  Administered 2020-04-13 – 2020-04-14 (×2): 40 mg via ORAL
  Filled 2020-04-13 (×2): qty 1

## 2020-04-13 MED ORDER — FAMOTIDINE 20 MG PO TABS
ORAL_TABLET | ORAL | Status: AC
  Administered 2020-04-13: 20 mg via ORAL
  Filled 2020-04-13: qty 1

## 2020-04-13 MED ORDER — BUPIVACAINE HCL (PF) 0.5 % IJ SOLN
INTRAMUSCULAR | Status: AC
  Filled 2020-04-13: qty 30

## 2020-04-13 MED ORDER — LIDOCAINE HCL (CARDIAC) PF 100 MG/5ML IV SOSY
PREFILLED_SYRINGE | INTRAVENOUS | Status: DC | PRN
  Administered 2020-04-13: 100 mg via INTRAVENOUS

## 2020-04-13 MED ORDER — ROCURONIUM BROMIDE 10 MG/ML (PF) SYRINGE
PREFILLED_SYRINGE | INTRAVENOUS | Status: AC
  Filled 2020-04-13: qty 10

## 2020-04-13 MED ORDER — DEXAMETHASONE SODIUM PHOSPHATE 10 MG/ML IJ SOLN
INTRAMUSCULAR | Status: DC | PRN
  Administered 2020-04-13: 10 mg via INTRAVENOUS

## 2020-04-13 MED ORDER — HYDRALAZINE HCL 10 MG PO TABS
10.0000 mg | ORAL_TABLET | Freq: Three times a day (TID) | ORAL | Status: DC
  Administered 2020-04-13 – 2020-04-14 (×2): 10 mg via ORAL
  Filled 2020-04-13 (×5): qty 1

## 2020-04-13 MED ORDER — PANCRELIPASE (LIP-PROT-AMYL) 12000-38000 UNITS PO CPEP
24000.0000 [IU] | ORAL_CAPSULE | Freq: Two times a day (BID) | ORAL | Status: DC | PRN
  Filled 2020-04-13: qty 2

## 2020-04-13 MED ORDER — SODIUM CHLORIDE 0.9 % IV SOLN
INTRAVENOUS | Status: DC | PRN
Start: 2020-04-13 — End: 2020-04-13

## 2020-04-13 MED ORDER — FENTANYL CITRATE (PF) 100 MCG/2ML IJ SOLN
25.0000 ug | INTRAMUSCULAR | Status: DC | PRN
  Administered 2020-04-13 (×3): 25 ug via INTRAVENOUS

## 2020-04-13 MED ORDER — SUGAMMADEX SODIUM 200 MG/2ML IV SOLN
INTRAVENOUS | Status: DC | PRN
  Administered 2020-04-13: 200 mg via INTRAVENOUS
  Administered 2020-04-13: 100 mg via INTRAVENOUS

## 2020-04-13 MED ORDER — LIDOCAINE-EPINEPHRINE 1 %-1:100000 IJ SOLN
INTRAMUSCULAR | Status: AC
  Filled 2020-04-13: qty 1

## 2020-04-13 MED ORDER — AMLODIPINE BESYLATE 10 MG PO TABS
10.0000 mg | ORAL_TABLET | Freq: Every day | ORAL | Status: DC
  Administered 2020-04-13 – 2020-04-14 (×2): 10 mg via ORAL
  Filled 2020-04-13 (×2): qty 1

## 2020-04-13 MED ORDER — SODIUM CHLORIDE 0.9 % IV SOLN: INTRAVENOUS | Status: DC

## 2020-04-13 MED ORDER — DEXAMETHASONE SODIUM PHOSPHATE 10 MG/ML IJ SOLN
INTRAMUSCULAR | Status: AC
  Filled 2020-04-13: qty 1

## 2020-04-13 MED ORDER — ATORVASTATIN CALCIUM 20 MG PO TABS
20.0000 mg | ORAL_TABLET | Freq: Every evening | ORAL | Status: DC
  Administered 2020-04-13: 20 mg via ORAL
  Filled 2020-04-13: qty 1

## 2020-04-13 MED ORDER — INSULIN ASPART 100 UNIT/ML ~~LOC~~ SOLN
0.0000 [IU] | Freq: Three times a day (TID) | SUBCUTANEOUS | Status: DC
  Administered 2020-04-13: 2 [IU] via SUBCUTANEOUS
  Administered 2020-04-14 (×2): 1 [IU] via SUBCUTANEOUS
  Filled 2020-04-13 (×3): qty 1

## 2020-04-13 MED ORDER — ROCURONIUM BROMIDE 100 MG/10ML IV SOLN
INTRAVENOUS | Status: DC | PRN
  Administered 2020-04-13: 50 mg via INTRAVENOUS
  Administered 2020-04-13 (×2): 10 mg via INTRAVENOUS

## 2020-04-13 MED ORDER — MORPHINE SULFATE (PF) 2 MG/ML IV SOLN
2.0000 mg | INTRAVENOUS | Status: DC | PRN
  Administered 2020-04-13: 2 mg via INTRAVENOUS
  Filled 2020-04-13: qty 1

## 2020-04-13 MED ORDER — ALBUTEROL SULFATE (2.5 MG/3ML) 0.083% IN NEBU: 2.5000 mg | INHALATION_SOLUTION | RESPIRATORY_TRACT | Status: DC | PRN

## 2020-04-13 MED ORDER — CITALOPRAM HYDROBROMIDE 20 MG PO TABS
20.0000 mg | ORAL_TABLET | Freq: Every day | ORAL | Status: DC
  Administered 2020-04-13 – 2020-04-14 (×2): 20 mg via ORAL
  Filled 2020-04-13 (×2): qty 1

## 2020-04-13 MED ORDER — FENTANYL CITRATE (PF) 100 MCG/2ML IJ SOLN
INTRAMUSCULAR | Status: AC
  Administered 2020-04-13: 25 ug via INTRAVENOUS
  Filled 2020-04-13: qty 2

## 2020-04-13 MED ORDER — INSULIN ASPART 100 UNIT/ML ~~LOC~~ SOLN
0.0000 [IU] | Freq: Every day | SUBCUTANEOUS | Status: DC
  Administered 2020-04-13: 3 [IU] via SUBCUTANEOUS
  Filled 2020-04-13: qty 1

## 2020-04-13 MED ORDER — CLOPIDOGREL BISULFATE 75 MG PO TABS: 75.0000 mg | ORAL_TABLET | Freq: Every day | ORAL | Status: DC

## 2020-04-13 MED ORDER — PROPOFOL 10 MG/ML IV BOLUS
INTRAVENOUS | Status: DC | PRN
  Administered 2020-04-13: 140 mg via INTRAVENOUS

## 2020-04-13 MED ORDER — FENTANYL CITRATE (PF) 100 MCG/2ML IJ SOLN
INTRAMUSCULAR | Status: AC
  Filled 2020-04-13: qty 2

## 2020-04-13 MED ORDER — LIDOCAINE HCL (PF) 2 % IJ SOLN
INTRAMUSCULAR | Status: AC
  Filled 2020-04-13: qty 5

## 2020-04-13 MED ORDER — GLYCOPYRROLATE 0.2 MG/ML IJ SOLN
INTRAMUSCULAR | Status: DC | PRN
  Administered 2020-04-13 (×2): .2 mg via INTRAVENOUS

## 2020-04-13 MED ORDER — DEXMEDETOMIDINE HCL IN NACL 200 MCG/50ML IV SOLN
INTRAVENOUS | Status: DC | PRN
  Administered 2020-04-13 (×2): 4 ug via INTRAVENOUS

## 2020-04-13 MED ORDER — SEVELAMER CARBONATE 800 MG PO TABS: 800.0000 mg | ORAL_TABLET | Freq: Two times a day (BID) | ORAL | Status: DC | PRN

## 2020-04-13 MED ORDER — HEPARIN SODIUM (PORCINE) 5000 UNIT/ML IJ SOLN
5000.0000 [IU] | Freq: Three times a day (TID) | INTRAMUSCULAR | Status: DC
Start: 2020-04-13 — End: 2020-04-13

## 2020-04-13 MED ORDER — CINACALCET HCL 30 MG PO TABS
30.0000 mg | ORAL_TABLET | Freq: Every day | ORAL | Status: DC
  Administered 2020-04-14: 30 mg via ORAL
  Filled 2020-04-13 (×2): qty 1

## 2020-04-13 MED ORDER — OXYCODONE HCL 5 MG PO TABS
5.0000 mg | ORAL_TABLET | ORAL | Status: DC | PRN
  Administered 2020-04-13 – 2020-04-14 (×3): 5 mg via ORAL
  Filled 2020-04-13 (×3): qty 1

## 2020-04-13 MED ORDER — EPOETIN ALFA 10000 UNIT/ML IJ SOLN
10000.0000 [IU] | INTRAMUSCULAR | Status: DC
  Administered 2020-04-14: 10000 [IU] via INTRAVENOUS

## 2020-04-13 MED ORDER — CEFAZOLIN SODIUM-DEXTROSE 1-4 GM/50ML-% IV SOLN: 1.0000 g | Freq: Two times a day (BID) | INTRAVENOUS | Status: DC

## 2020-04-13 MED ORDER — CARVEDILOL 6.25 MG PO TABS
6.2500 mg | ORAL_TABLET | Freq: Two times a day (BID) | ORAL | Status: DC
  Administered 2020-04-13 – 2020-04-14 (×2): 6.25 mg via ORAL
  Filled 2020-04-13 (×2): qty 1

## 2020-04-13 MED ORDER — CHLORHEXIDINE GLUCONATE 0.12 % MT SOLN
OROMUCOSAL | Status: AC
  Administered 2020-04-13: 15 mL via OROMUCOSAL
  Filled 2020-04-13: qty 15

## 2020-04-13 MED ORDER — GABAPENTIN 100 MG PO CAPS
100.0000 mg | ORAL_CAPSULE | Freq: Two times a day (BID) | ORAL | Status: DC
  Administered 2020-04-13 – 2020-04-14 (×2): 100 mg via ORAL
  Filled 2020-04-13 (×2): qty 1

## 2020-04-13 MED ORDER — GLYCOPYRROLATE 0.2 MG/ML IJ SOLN
INTRAMUSCULAR | Status: AC
  Filled 2020-04-13: qty 2

## 2020-04-13 MED ORDER — ASPIRIN EC 81 MG PO TBEC
81.0000 mg | DELAYED_RELEASE_TABLET | Freq: Every day | ORAL | Status: DC
  Administered 2020-04-14: 81 mg via ORAL
  Filled 2020-04-13: qty 1

## 2020-04-13 MED ORDER — CHLORHEXIDINE GLUCONATE 0.12 % MT SOLN: 15.0000 mL | Freq: Once | OROMUCOSAL | Status: AC

## 2020-04-13 MED ORDER — NITROGLYCERIN 0.4 MG SL SUBL: 0.4000 mg | SUBLINGUAL_TABLET | SUBLINGUAL | Status: DC | PRN

## 2020-04-13 MED ORDER — IPRATROPIUM-ALBUTEROL 20-100 MCG/ACT IN AERS
2.0000 | INHALATION_SPRAY | Freq: Four times a day (QID) | RESPIRATORY_TRACT | Status: DC | PRN
  Filled 2020-04-13: qty 4

## 2020-04-13 MED ORDER — CEFAZOLIN SODIUM-DEXTROSE 1-4 GM/50ML-% IV SOLN
INTRAVENOUS | Status: AC
  Filled 2020-04-13: qty 50

## 2020-04-13 MED ORDER — HEPARIN SODIUM (PORCINE) 5000 UNIT/ML IJ SOLN
INTRAMUSCULAR | Status: AC
  Filled 2020-04-13: qty 1

## 2020-04-13 MED ORDER — FAMOTIDINE 20 MG PO TABS: 20.0000 mg | ORAL_TABLET | Freq: Once | ORAL | Status: AC

## 2020-04-13 MED ORDER — MIDAZOLAM HCL 2 MG/2ML IJ SOLN
INTRAMUSCULAR | Status: AC
  Filled 2020-04-13: qty 2

## 2020-04-13 MED ORDER — OXYCODONE-ACETAMINOPHEN 5-325 MG PO TABS: 1.0000 | ORAL_TABLET | Freq: Four times a day (QID) | ORAL | 0 refills | Status: DC | PRN

## 2020-04-13 MED ORDER — PHENYLEPHRINE HCL (PRESSORS) 10 MG/ML IV SOLN
INTRAVENOUS | Status: DC | PRN
  Administered 2020-04-13: 100 ug via INTRAVENOUS

## 2020-04-13 MED ORDER — SEVELAMER CARBONATE 800 MG PO TABS
2400.0000 mg | ORAL_TABLET | Freq: Three times a day (TID) | ORAL | Status: DC
  Administered 2020-04-13 – 2020-04-14 (×3): 2400 mg via ORAL
  Filled 2020-04-13: qty 3

## 2020-04-13 MED ORDER — ORAL CARE MOUTH RINSE: 15.0000 mL | Freq: Once | OROMUCOSAL | Status: AC

## 2020-04-13 MED ORDER — BUPIVACAINE LIPOSOME 1.3 % IJ SUSP
INTRAMUSCULAR | Status: AC
  Filled 2020-04-13: qty 20

## 2020-04-13 MED ORDER — PHENYLEPHRINE HCL-NACL 20-0.9 MG/250ML-% IV SOLN
INTRAVENOUS | Status: DC | PRN
  Administered 2020-04-13: 15 ug/min via INTRAVENOUS

## 2020-04-13 MED ORDER — HEPARIN SODIUM (PORCINE) 1000 UNIT/ML IJ SOLN
INTRAMUSCULAR | Status: AC
  Filled 2020-04-13: qty 1

## 2020-04-13 MED ORDER — MIDAZOLAM HCL 2 MG/2ML IJ SOLN
INTRAMUSCULAR | Status: DC | PRN
  Administered 2020-04-13: 2 mg via INTRAVENOUS

## 2020-04-13 MED ORDER — CEFAZOLIN SODIUM-DEXTROSE 1-4 GM/50ML-% IV SOLN
1.0000 g | INTRAVENOUS | Status: AC
  Administered 2020-04-13: 1 g via INTRAVENOUS

## 2020-04-13 MED ORDER — ALBUTEROL SULFATE HFA 108 (90 BASE) MCG/ACT IN AERS
2.0000 | INHALATION_SPRAY | RESPIRATORY_TRACT | Status: DC | PRN
  Filled 2020-04-13: qty 6.7

## 2020-04-13 SURGICAL SUPPLY — 77 items
ADH SKN CLS APL DERMABOND .7 (GAUZE/BANDAGES/DRESSINGS) ×1
APL PRP STRL LF DISP 70% ISPRP (MISCELLANEOUS) ×2
APPLIER CLIP 9.375 SM OPEN (CLIP)
APR CLP SM 9.3 20 MLT OPN (CLIP)
BAG DECANTER FOR FLEXI CONT (MISCELLANEOUS) ×2 IMPLANT
BLADE SURG 15 STRL LF DISP TIS (BLADE) ×1 IMPLANT
BLADE SURG 15 STRL SS (BLADE) ×2
BLADE SURG SZ11 CARB STEEL (BLADE) ×2 IMPLANT
BOOT SUTURE AID YELLOW STND (SUTURE) ×2 IMPLANT
BRUSH SCRUB EZ  4% CHG (MISCELLANEOUS) ×2
BRUSH SCRUB EZ 4% CHG (MISCELLANEOUS) ×1 IMPLANT
CANISTER SUCT 1200ML W/VALVE (MISCELLANEOUS) ×2 IMPLANT
CANNULA 5F STIFF (CANNULA) ×2 IMPLANT
CATH PALINDROME-P 44CM KIT (CATHETERS) ×2
CHLORAPREP W/TINT 26 (MISCELLANEOUS) ×4 IMPLANT
CLIP APPLIE 9.375 SM OPEN (CLIP) IMPLANT
COMPONENT HERO ACCESSORY KIT (VASCULAR PRODUCTS) ×2 IMPLANT
COMPONENT HERO ARTERIAL GRAFT (Vascular Products) ×2 IMPLANT
COMPONENT HERO VENOUS OVERFLOW (Vascular Products) ×2 IMPLANT
COVER PROBE FLX POLY STRL (MISCELLANEOUS) ×2 IMPLANT
COVER WAND RF STERILE (DRAPES) ×2 IMPLANT
DERMABOND ADVANCED (GAUZE/BANDAGES/DRESSINGS) ×1
DERMABOND ADVANCED .7 DNX12 (GAUZE/BANDAGES/DRESSINGS) ×1 IMPLANT
DRAPE 3/4 80X56 (DRAPES) ×4 IMPLANT
DRAPE C-ARM XRAY 36X54 (DRAPES) ×2 IMPLANT
DRAPE INCISE IOBAN 66X45 STRL (DRAPES) IMPLANT
DRESSING SURGICEL FIBRLLR 1X2 (HEMOSTASIS) ×1 IMPLANT
DRSG SURGICEL FIBRILLAR 1X2 (HEMOSTASIS) ×2
ELECT CAUTERY BLADE 6.4 (BLADE) ×2 IMPLANT
ELECT REM PT RETURN 9FT ADLT (ELECTROSURGICAL) ×2
ELECTRODE REM PT RTRN 9FT ADLT (ELECTROSURGICAL) ×1 IMPLANT
GLOVE BIO SURGEON STRL SZ7 (GLOVE) ×2 IMPLANT
GLOVE INDICATOR 7.5 STRL GRN (GLOVE) ×2 IMPLANT
GLOVE SURG SYN 8.0 (GLOVE) ×2 IMPLANT
GOWN STRL REUS W/ TWL LRG LVL3 (GOWN DISPOSABLE) ×2 IMPLANT
GOWN STRL REUS W/ TWL XL LVL3 (GOWN DISPOSABLE) ×1 IMPLANT
GOWN STRL REUS W/TWL LRG LVL3 (GOWN DISPOSABLE) ×4
GOWN STRL REUS W/TWL XL LVL3 (GOWN DISPOSABLE) ×2
IV NS 500ML (IV SOLUTION) ×2
IV NS 500ML BAXH (IV SOLUTION) ×1 IMPLANT
KIT CATH CHRNC PALINDROME PRCS (CATHETERS) ×1 IMPLANT
KIT TURNOVER KIT A (KITS) ×2 IMPLANT
LABEL OR SOLS (LABEL) ×2 IMPLANT
LOOP RED MAXI  1X406MM (MISCELLANEOUS) ×1
LOOP VESSEL MAXI  1X406 RED (MISCELLANEOUS) ×1
LOOP VESSEL MAXI 1X406 RED (MISCELLANEOUS) ×1 IMPLANT
LOOP VESSEL MINI 0.8X406 BLUE (MISCELLANEOUS) ×1 IMPLANT
LOOPS BLUE MINI 0.8X406MM (MISCELLANEOUS) ×1
NEEDLE FILTER BLUNT 18X 1/2SAF (NEEDLE) ×1
NEEDLE FILTER BLUNT 18X1 1/2 (NEEDLE) ×1 IMPLANT
NEEDLE HYPO 25X1 1.5 SAFETY (NEEDLE) IMPLANT
NS IRRIG 500ML POUR BTL (IV SOLUTION) ×2 IMPLANT
PACK ANGIOGRAPHY (CUSTOM PROCEDURE TRAY) ×2 IMPLANT
PACK BASIN MAJOR ARMC (MISCELLANEOUS) ×2 IMPLANT
PACK UNIVERSAL (MISCELLANEOUS) IMPLANT
PAD PREP 24X41 OB/GYN DISP (PERSONAL CARE ITEMS) IMPLANT
SHEATH BRITE TIP 8FRX11 (SHEATH) ×2 IMPLANT
STOCKINETTE STRL 4IN 9604848 (GAUZE/BANDAGES/DRESSINGS) ×2 IMPLANT
SUT ETHIBOND 0 (SUTURE) IMPLANT
SUT GTX CV-6 30 (SUTURE) ×4 IMPLANT
SUT MNCRL AB 4-0 PS2 18 (SUTURE) ×2 IMPLANT
SUT MNCRL+ 5-0 UNDYED PC-3 (SUTURE) ×1 IMPLANT
SUT MONOCRYL 5-0 (SUTURE) ×2
SUT PROLENE 6 0 BV (SUTURE) ×4 IMPLANT
SUT SILK 2 0 (SUTURE) ×2
SUT SILK 2 0 SH (SUTURE) ×2 IMPLANT
SUT SILK 2-0 18XBRD TIE 12 (SUTURE) ×1 IMPLANT
SUT SILK 3 0 (SUTURE) ×2
SUT SILK 3-0 18XBRD TIE 12 (SUTURE) ×1 IMPLANT
SUT SILK 4 0 (SUTURE) ×2
SUT SILK 4-0 18XBRD TIE 12 (SUTURE) ×1 IMPLANT
SUT VIC AB 3-0 SH 27 (SUTURE) ×2
SUT VIC AB 3-0 SH 27X BRD (SUTURE) ×1 IMPLANT
SYR 10ML LL (SYRINGE) IMPLANT
SYR 20ML LL LF (SYRINGE) ×2 IMPLANT
SYR 5ML LL (SYRINGE) ×2 IMPLANT
WIRE J 3MM .035X145CM (WIRE) ×2 IMPLANT

## 2020-04-13 NOTE — Progress Notes (Signed)
[facility]  Patient with AM labs notable for hemoglobin 6.5 and potassium 6.5 Improvement in oozing to right groin however will admit for observation. Discussed with nephrology who will dialyze in the AM (as well as transfuse 2 units packed red blood cells). CBC and BMP in AM.  Discussed with Dr. Eber Hong Nichalas Coin PA-C 04/13/2020 3:16 PM

## 2020-04-13 NOTE — Progress Notes (Signed)
Pt's son called in voicing concerns of pt being discharged home this evening due to problems with postop oozing and her having to go to dialysis tomorrow.  Waiting on decision to admit.

## 2020-04-13 NOTE — Progress Notes (Signed)
Crackers, diet soda, water, juice offered.  Pt refuses everything but fussing because we're "not feeding her or letting her have anything to drink".  Explained renal carb-modified diet to her.  Waiting on room assignment and food tray to be delivered.

## 2020-04-13 NOTE — Progress Notes (Signed)
Pt awake requesting something to eat.  CBG 235. Ordered diet and called dietary.  She is contacting family and informing them she'll be here over night. Agitated with having to stay.  Support given.

## 2020-04-13 NOTE — Anesthesia Procedure Notes (Signed)
Procedure Name: Intubation Performed by: Fletcher-Harrison, Demani Mcbrien, CRNA Pre-anesthesia Checklist: Patient identified, Emergency Drugs available, Suction available and Patient being monitored Patient Re-evaluated:Patient Re-evaluated prior to induction Oxygen Delivery Method: Circle system utilized Preoxygenation: Pre-oxygenation with 100% oxygen Induction Type: IV induction Ventilation: Mask ventilation without difficulty Laryngoscope Size: McGraph and 3 Grade View: Grade I Tube type: Oral Tube size: 6.5 mm Number of attempts: 1 Airway Equipment and Method: Stylet and Oral airway Placement Confirmation: ETT inserted through vocal cords under direct vision,  positive ETCO2,  breath sounds checked- equal and bilateral and CO2 detector Secured at: 21 cm Tube secured with: Tape Dental Injury: Teeth and Oropharynx as per pre-operative assessment        

## 2020-04-13 NOTE — Progress Notes (Addendum)
   04/13/20 0715  Clinical Encounter Type  Visited With Patient  Visit Type Initial  Referral From Chaplain  Consult/Referral To [place] visited with Patient, who seem to be in an upbeat mood. Patient talked about helping others and said that people question what she does, but they must realize this is what Jesus did. He served people and did it in love. Patient did say that as she serves others, she tends to forget about herself. She went on to say that she prayers for others and fits herself in the prayer somewhere, usually at the end. Patient said she gets out of line every now and then, "because I am not perfect" God puts her back in place. Patient talked a lot about the love and companion of God. Patient also shared how she has learned to leave her children in God's hands. Chaplain asked if she could pray with her and Patient grabbed Chaplain's hand. As she was getting ready to pray, staff headed toward the room and Chaplain mentioned they were coming and Patient said" they can wait until we pray". Not even hearing the conversation, the nurse quietly waited until Chaplain finished praying.   As nurse was wheeling Patient down the hall, she said to [place] "Thank you and God bless you."

## 2020-04-13 NOTE — Plan of Care (Signed)
Continue with plan of care.  

## 2020-04-13 NOTE — Anesthesia Postprocedure Evaluation (Signed)
Anesthesia Post Note  Patient: [name]  Procedure(s) Performed: INSERTION OF HERO VASCULAR ACCESS DEVICE (GRAFT) (Right )  Patient location during evaluation: PACU Anesthesia Type: General Level of consciousness: awake and alert Pain management: pain level controlled Vital Signs Assessment: post-procedure vital signs reviewed and stable Respiratory status: spontaneous breathing and respiratory function stable Cardiovascular status: stable Anesthetic complications: no   No complications documented.   Last Vitals:  Vitals:   04/13/20 1057 04/13/20 1112  BP: (!) 137/55 (!) 149/61  Pulse: 65 66  Resp: 12 12  Temp: (!) 35.9 C   SpO2: 100% 100%    Last Pain:  Vitals:   04/13/20 1125  TempSrc:   PainSc: 7                  Chenika Nevils K

## 2020-04-13 NOTE — Progress Notes (Signed)
[facility]   Called to bedside to assess oozing from newly placed right groin PermCath. OR dressing removed oozing noted from skin line Lidocaine with epinephrine treated to skin line. Two 2-0 Monocryl sutures used to approximate skin line. Hemostasis achieved Sterile dressing reapplied  Discussed with Dr. Eber Hong Maidie Streight PA-C 04/13/2020 2:14 PM

## 2020-04-13 NOTE — Progress Notes (Addendum)
   04/13/20 2000  Clinical Encounter Type  Visited With Patient  Visit Type Follow-up  Referral From Chaplain  Consult/Referral To Chaplain  Once Chaplain discovered patient had been admitted she checked in on patient. When chaplain entered the room, patient was eating. Chaplain told patient that she was checking to see how she was doing. Patient said she read her Scripture, said her prayers, and talked to her family. Today is her grandson, sister, and nephew's birthday. As we continued to talk, patient told chaplain that she has a soft voice and that is why God called her to do this work. Chaplain wished patient well and patient told chaplain to come back to see her.

## 2020-04-13 NOTE — Anesthesia Preprocedure Evaluation (Signed)
Anesthesia Evaluation  Patient identified by MRN, date of birth, ID band Patient awake    Reviewed: Allergy & Precautions, NPO status , Patient's Chart, lab work & pertinent test results  History of Anesthesia Complications Negative for: history of anesthetic complications  Airway Mallampati: II       Dental  (+) Upper Dentures, Lower Dentures   Pulmonary asthma , neg sleep apnea, COPD,  COPD inhaler, Not current smoker,           Cardiovascular hypertension, Pt. on medications + CAD, + Past MI and +CHF  (-) dysrhythmias (-) Valvular Problems/Murmurs     Neuro/Psych neg Seizures Anxiety Depression    GI/Hepatic Neg liver ROS, hiatal hernia, GERD  Medicated and Controlled,  Endo/Other  diabetes, Type 2, Oral Hypoglycemic Agents  Renal/GU ESRF and DialysisRenal disease     Musculoskeletal   Abdominal   Peds  Hematology   Anesthesia Other Findings   Reproductive/Obstetrics                             Anesthesia Physical Anesthesia Plan  ASA: IV  Anesthesia Plan: General   Post-op Pain Management:    Induction: Intravenous  PONV Risk Score and Plan: 3 and Ondansetron, Dexamethasone and Treatment may vary due to age or medical condition  Airway Management Planned: Oral ETT and LMA  Additional Equipment:   Intra-op Plan:   Post-operative Plan:   Informed Consent: I have reviewed the patients History and Physical, chart, labs and discussed the procedure including the risks, benefits and alternatives for the proposed anesthesia with the patient or authorized representative who has indicated his/her understanding and acceptance.       Plan Discussed with:   Anesthesia Plan Comments:         Anesthesia Quick Evaluation

## 2020-04-13 NOTE — Progress Notes (Signed)
[facility]  ROUNDING NOTE   Subjective:   [name] admitted to [facility] on 04/13/2020 for ESRD (end stage renal disease) [facility]) [N18.6] Patient underwent creation of right AVG (HERO) by Dr. Delana [name]. Found to have hemoglobin of 6.5 and was decided to admit patient.   Objective:  Vital signs in last 24 hours:  Temp:  [96.7 F (35.9 C)-96.9 F (36.1 C)] (P) 96.9 F (36.1 C) (07/23 1330) Pulse Rate:  [57-66] 62 (07/23 1430) Resp:  [8-16] 11 (07/23 1330) BP: (122-164)/(53-66) 154/61 (07/23 1430) SpO2:  [93 %-100 %] 97 % (07/23 1430) Weight:  [81.1 kg] 81.1 kg (07/23 0650)  Weight change:  Filed Weights   04/13/20 0650  Weight: 81.1 kg    Intake/Output: No intake/output data recorded.   Intake/Output this shift:  Total I/O In: 325 [I.V.:325] Out: -   Physical Exam: General: NAD,   Head: Normocephalic, atraumatic. Moist oral mucosal membranes  Eyes: Anicteric, PERRL  Neck: Supple, trachea midline  Lungs:  Clear to auscultation  Heart: Regular rate and rhythm  Abdomen:  Soft, nontender,   Extremities:  trace peripheral edema.  Neurologic: Nonfocal, moving all four extremities  Skin: No lesions  Access: R femoral permcath    Basic Metabolic Panel: Recent Labs  Lab 04/13/20 0703 04/13/20 0705  NA 137 136  K 4.8 6.5*  CL 99 97*  CO2 24  --   GLUCOSE 67* 64*  BUN 21* 26*  CREATININE 5.30* 5.70*  CALCIUM 8.4*  --     Liver Function Tests: No results for input(s): AST, ALT, ALKPHOS, BILITOT, PROT, ALBUMIN in the last 168 hours. No results for input(s): LIPASE, AMYLASE in the last 168 hours. No results for input(s): AMMONIA in the last 168 hours.  CBC: Recent Labs  Lab 04/13/20 0705  HGB 6.5*  HCT 19.0*    Cardiac Enzymes: No results for input(s): CKTOTAL, CKMB, CKMBINDEX, TROPONINI in the last 168 hours.  BNP: Invalid input(s): POCBNP  CBG: Recent Labs  Lab 04/13/20 0714 04/13/20 0910  GLUCAP 65* 111*     Microbiology: Results for orders placed or performed during the hospital encounter of 04/10/20  SARS CORONAVIRUS 2 (TAT 6-24 HRS) Nasopharyngeal Nasopharyngeal Swab     Status: None   Collection Time: 04/10/20 10:49 AM   Specimen: Nasopharyngeal Swab  Result Value Ref Range Status   SARS Coronavirus 2 NEGATIVE NEGATIVE Final    Comment: (NOTE) SARS-CoV-2 target nucleic acids are NOT DETECTED.  The SARS-CoV-2 RNA is generally detectable in upper and lower respiratory specimens during the acute phase of infection. Negative results do not preclude SARS-CoV-2 infection, do not rule out co-infections with other pathogens, and should not be used as the sole basis for treatment or other patient management decisions. Negative results must be combined with clinical observations, patient history, and epidemiological information. The expected result is Negative.  Fact Sheet for Patients: SugarRoll.be  Fact Sheet for Healthcare Providers: https://www.woods-mathews.com/  This test is not yet approved or cleared by the [place] FDA and  has been authorized for detection and/or diagnosis of SARS-CoV-2 by FDA under an Emergency Use Authorization (EUA). This EUA will remain  in effect (meaning this test can be used) for the duration of the COVID-19 declaration under Se ction 564(b)(1) of the Act, 21 U.S.C. section 360bbb-3(b)(1), unless the authorization is terminated or revoked sooner.  Performed at [facility], [place] [address], [place], [place] [address]     Coagulation Studies: Recent Labs  04/13/20 0703  LABPROT 12.6  INR 1.0    Urinalysis: No results for input(s): COLORURINE, LABSPEC, PHURINE, GLUCOSEU, HGBUR, BILIRUBINUR, KETONESUR, PROTEINUR, UROBILINOGEN, NITRITE, LEUKOCYTESUR in the last 72 hours.  Invalid input(s): APPERANCEUR    Imaging: DG C-Arm 1-60 Min-No Report  Result Date: 04/13/2020 Fluoroscopy was  utilized by the requesting physician.  No radiographic interpretation.   DG C-Arm 1-60 Min-No Report  Result Date: 04/13/2020 Fluoroscopy was utilized by the requesting physician.  No radiographic interpretation.     Medications:   .  ceFAZolin (ANCEF) IV     . amLODipine  10 mg Oral Daily  . [START ON 04/14/2020] aspirin EC  81 mg Oral Daily  . atorvastatin  20 mg Oral QPM  . carvedilol  6.25 mg Oral BID  . Chlorhexidine Gluconate Cloth  6 each Topical Once   And  . Chlorhexidine Gluconate Cloth  6 each Topical Once  . cinacalcet  30 mg Oral Daily  . citalopram  20 mg Oral Daily  . [START ON 04/14/2020] clopidogrel  75 mg Oral QHS  . gabapentin  100 mg Oral BID  . hydrALAZINE  10 mg Oral TID  . insulin aspart  0-5 Units Subcutaneous QHS  . insulin aspart  0-6 Units Subcutaneous TID WC  . lipase/protease/amylase  12,000 Units Oral See admin instructions  . pantoprazole  40 mg Oral Daily  . sevelamer carbonate  800-2,400 mg Oral See admin instructions   albuterol, albuterol, Ipratropium-Albuterol, nitroGLYCERIN, oxyCODONE  Assessment/ Plan:  [name] is a 59 y.o. black female with end stage renal disease on hemodialysis for 14 years, diabetes mellitus type II insulin dependent, pancreatic deficiency, hypertension, history of renal cancer, peripheral vascular disease, coronary artery disease, GERD, COPD who has been admitted on 04/13/2020 post op for AVG placement by Dr. Delana [name]  [facility] [name] TTS RIJ permcath 81kg  1. End Stage Renal Disease:  - hemodialysis for tomorrow.   2. Hypertension: home regimen of amlodipine, carvedilol, and hydralazine. History of difficult to control.   3. Anemia of chronic kidney disease: hemoglobin with acute blood loss anemia. 6.5 - scheduled for 2 units PRBC  4. Secondary Hyperparathyroidism: outpatient labs on 7/13 PTH 979, phos 6.6, calcium 8.2 - sevelamer with meals  - cinacalcet   LOS: 0 [name] 7/23/20213:16 PM

## 2020-04-13 NOTE — Op Note (Signed)
OPERATIVE NOTE   PROCEDURE: 1. Creation of right arm prosthetic A-V graft, HERO graft 2. Placement of venous outflow, intravascular portion of HERO graft same venous access 3. Introduction catheter into superior vena cava 4. Removal of existing permacath right external jugular vein with placement of a new tunneled catheter right common femoral vein 5. 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 Hezzie Bump  ANESTHESIA: general  ESTIMATED BLOOD LOSS: 100 cc  FINDING(S): 1.  Central venous stricture noted greater than 70%  SPECIMEN(S):  None  INDICATIONS:   [name] is a 59 y.o. y.o. female who presents with catheter based access. The patient has undergone evaluation in preparation for a upper arm access. The patient appears to be a candidate for a HERO graft. The risks and benefits as well as alternatives have been reviewed in detail with the patient. The patient has had multiple upper extremity access in the past. All questions are answered. Patient voices understanding and wishes to proceed with right arm hero graft area  DESCRIPTION: After obtaining full informed written consent, the patient was brought back to the operating room and placed supine upon the operating table.  The patient received IV antibiotics prior to induction.  After obtaining adequate anesthesia, the patient was positioned supine with his neck extended slightly and rotated to the right the left neck chest wall and entire left arm is then prepped and draped in the standard fashion appropriate time out is called.    A first assistant is required in order to allow for a safe and more efficient operation.  Duties  include retraction of tissues to allow for optimal exposure, assisting with suture ligation of vessels as well as maintaining a clear field of view with suction as needed.  Further duties include assisting with patient positioning during the surgery as well as wound closure.  I believe that this procedure requires a first assistant in order for it to be performed at a level in keeping with the high standards of this institution.   Ultrasound is placed in a sterile sleeve. Ultrasound is utilized secondary to lack of appropriate landmarks and to avoid vascular injury. Under real-time visualization the right common femoral vein.  It is noted to be echo lucent and compressible indicating patency.  Image is recorded for the record.  Under real time visualization a microneedle is inserted without difficulty, micro-sheath is then placed over the microwire.  Under fluoroscopic guidance the microwire was advanced into the venous system.  J-wire is then advanced and serial dilators were advanced over the J-wire.  Dilator and peel-away sheath were then inserted.  A 44 cm tip to cuff unibody tunneled catheter is then approximated to the abdomen and lower chest and an exit site is selected.  A small incision is made with 11 blade scalpel and the tunneling device is used to pull the catheter from the exit site to the wire insertion site.  Wire and dilator were removed and the catheter is in third inserted through the peel-away sheath.  Peel-away sheath is removed.  Catheter is then inspected under fluoroscopic guidance the tip of the catheter is noted to be approximately 2 cm below the atrial inferior vena caval junction.  The catheter is contour is smooth and is free of kinks.  Both lumens aspirate and flush easily and both lumens were then packed with 5000 units of heparin per 2.5 cc.  The wire insertion site incision is then closed with a 4-0 Monocryl and Dermabond.  Pursestring suture of 4-0 Monocryl was placed around the  exit site.  A Biopatch is then placed the catheter secured to the thigh with 0 silk suture and then a sterile dressing is placed.  Attention is then turned to the right arm and right neck and chest wall.  Small cutdown is made at the base of the neck overlying the palpable catheter.  Catheter is isolated delivered into the surgical field transected and an Amplatz Super Stiff wire is advanced under fluoroscopic guidance.  Once the wire is positioned in the inferior vena cava the remaining segment of the catheter in its external portion is then dissected free removed and passed off the field.  The venous outflow component and the associated sheath and dilators are then opened on the sterile field and prepped in the usual fashion. 8 French sheath was removed and the dilators were advanced over the wire and subsequently the dilator and peel-away sheath is inserted under direct fluoroscopic guidance. The venous outflow portion of the HERO graft is then advanced over the wire through the peel-away sheath without difficulty and under fluoroscopic guidance positioned with its tip well into the inferior vena cava. Peel-away sheath is removed as is the dilator wire and the outflow component is flushed copiously with heparinized saline and clamped.  The venous outflow component and the associated sheath and dilators are then opened on the sterile field and prepped in the usual fashion. 8 French sheath was removed and the dilators were advanced over the wire and subsequently the dilator and peel-away sheath is inserted under direct fluoroscopic guidance. The venous outflow portion of the HERO graft is then advanced over the wire through the peel-away sheath without difficulty and under fluoroscopic guidance positioned with its tip well into the inferior vena cava. Peel-away sheath is removed as is the dilator wire and the outflow component is flushed copiously with heparinized saline and clamped.  Attention is then  turned to the brachial artery. A linear incision is made overlying the brachial impulse approximately 1 fingerbreadth above the antecubital crease.  Dissection is carried down through the soft tissues.  Fascia is opened linearly and the neurovascular bundle is identified.  Brachial artery is then dissected free from the surrounding veins and tissues and looped proximally and distally with Silastic Vesseloops.  The stump of the previous PTFE graft is used as a landmark and this is also dissected circumferentially.  The graft itself is transected and the arteriotomy is created using the previous anastomosis.  A second incision is then made in the deltopectoral groove and the dissection is carried down through the soft tissues to expose the fascia covering the pectoralis muscle. Using the Bard tunneling device a tunnel was then created from the brachial artery exposure to the deltopectoral incision. The PTFE portion of the hero is then pulled subcutaneously and the grommet is positioned in the center of the deltopectoral incision.  The intravascular portion is then pulled subcutaneously from the neck insertion site to the deltopectoral incision using a large Kelly clamp.  Radiology is returned to the OR and under real-time visualization the venous outflow component is pulled back so that the tip is in the mid atrium it is then tunneled from the neck counterincision to the deltopectoral incision and connected to the PTFE  portion of the hero graft without difficulty. 0 Ethibond is used to secure this connection.    Graft is approximated to the brachial artery and then trimmed to an appropriate bevel. Brachial artery was then controlled with the vessel loops. 6-0 Prolene stay suture is placed and an end graft to side brachial artery anastomosis fashioned with CV 6 Gore suture. Flushing maneuvers were performed and flow is reestablished to the hand.    Flow was now established through the graft and an excellent  thrill is noted. Radial pulses maintain.  All 3 wounds were then irrigated with sterile saline the brachial as well as the deltopectoral incisions are closed in layers using 3-0 Vicryl for the subcutaneous layers followed by 4-0 Monocryl subcuticular to close skin. Neck counterincision was closed with 4-0 Monocryl subcuticular. Dermabond is applied to all 3 incisions.  Patient tolerated procedure well there were no immediate complications. Taken to the recovery room in good condition.  COMPLICATIONS: None  CONDITION: Good  Hortencia Pilar. [facility] Office: [phone]  04/13/2020,10:49 AM

## 2020-04-13 NOTE — H&P (Signed)
[facility] Admission History & Physical  MRN : [id number]  [name] is a 59 y.o. (Jul 02, 1961) female who presents with chief complaint of No chief complaint on file. Marland Kitchen  History of Present Illness:    The patient is seen for evaluation of dialysis access.  The patient has a history of multiple failed accesses.  There have been accesses in both arms.  Venogram from 01/18/2020 shows anatomy favorable for a right HERO graft    Current access is via a catheter which is functioning well.  There have not been any episodes of catheter infection.  The patient denies amaurosis fugax or recent TIA symptoms. There are no recent neurological changes noted. The patient denies claudication symptoms or rest pain symptoms. The patient denies history of DVT, PE or superficial thrombophlebitis. The patient denies recent episodes of angina or shortness of breath.    Current Facility-Administered Medications  Medication Dose Route Frequency Provider Last Rate Last Admin  . 0.9 %  sodium chloride infusion   Intravenous Continuous Alvin Critchley, MD 10 mL/hr at 04/13/20 0711 New Bag at 04/13/20 0711  . ceFAZolin (ANCEF) 1-4 GM/50ML-% IVPB           . ceFAZolin (ANCEF) IVPB 1 g/50 mL premix  1 g Intravenous On Call to [place], [place], NP      . Chlorhexidine Gluconate Cloth 2 % PADS 6 each  6 each Topical Once Kris Hartmann, NP       And  . Chlorhexidine Gluconate Cloth 2 % PADS 6 each  6 each Topical Once Kris Hartmann, NP        Past Medical History:  Diagnosis Date  . Anemia   . Anginal pain ([place])   . Anxiety   . Arthritis   . Asthma   . Broken wrist   . Bronchitis   . chronic diastolic CHF 05/16/36  . Chronic kidney disease   . COPD (chronic obstructive pulmonary disease) ([place])   . Coronary artery disease    a. cath 2013: stenting to RCA (report not available); b. cath 2014: LM nl, pLAD 40%, mLAD nl, ost LCx 40%, mid LCx nl, pRCA 30% @ site of prior stent,  mRCA 50%  . Depression   . Diabetes mellitus ([place])   . Diabetes mellitus without complication ([place])   . Diabetic neuropathy ([place])   . dialysis 2006  . Diverticulosis   . Dizziness   . Dyspnea   . Elevated lipids   . Environmental and seasonal allergies   . ESRD (end stage renal disease) on dialysis ([place])    M-W-F  . Gastroparesis   . GERD (gastroesophageal reflux disease)   . Headache   . History of anemia due to chronic kidney disease   . History of hiatal hernia   . HOH (hard of hearing)   . Hx of pancreatitis 2015  . Hypertension   . Lower extremity edema   . Mitral regurgitation    a. echo 10/2013: EF 62%, noWMA, mildly dilated LA, mild to mod MR/TR, GR1DD  . Myocardial infarction ([place])   . Orthopnea   . Parathyroid abnormality ([place])   . Peripheral arterial disease ([place])   . Pneumonia   . Renal cancer ([place])   . Renal insufficiency    Pt is on dialysis on M,W + F.  . Wheezing     Past Surgical History:  Procedure Laterality Date  . A/V SHUNTOGRAM Left 01/20/2018   Procedure:  A/V SHUNTOGRAM;  Surgeon: Algernon Huxley, MD;  Location: [place] [facility];  Service: Cardiovascular;  Laterality: Left;  . ABDOMINAL HYSTERECTOMY  1992  . AMPUTATION TOE Left 10/02/2017   Procedure: AMPUTATION TOE-LEFT GREAT TOE;  Surgeon: Albertine Patricia, DPM;  Location: [facility];  Service: Podiatry;  Laterality: Left;  . APPENDECTOMY    . APPLICATION OF WOUND VAC N/A 11/25/2019   Procedure: APPLICATION OF WOUND VAC;  Surgeon: Katha Cabal, MD;  Location: [facility];  Service: Vascular;  Laterality: N/A;  . ARTERY BIOPSY Right 10/11/2018   Procedure: BIOPSY TEMPORAL ARTERY;  Surgeon: Vickie Epley, MD;  Location: [facility];  Service: General;  Laterality: Right;  . CARDIAC CATHETERIZATION Left 07/26/2015   Procedure: Left Heart Cath and Coronary Angiography;  Surgeon: Dionisio David, MD;  Location: [place] [facility];  Service: Cardiovascular;  Laterality: Left;  . CATARACT EXTRACTION W/  INTRAOCULAR LENS IMPLANT Right   . CATARACT EXTRACTION W/PHACO Left 03/10/2017   Procedure: CATARACT EXTRACTION PHACO AND INTRAOCULAR LENS PLACEMENT (IOC);  Surgeon: Birder Robson, MD;  Location: [facility];  Service: Ophthalmology;  Laterality: Left;  [place] 00:51.9 AP% 14.2 CDE 7.39 fluid pack lot # [id number]  . CENTRAL LINE INSERTION Right 11/11/2019   Procedure: CENTRAL LINE INSERTION;  Surgeon: Katha Cabal, MD;  Location: [facility];  Service: Vascular;  Laterality: Right;  . CENTRAL LINE INSERTION  11/25/2019   Procedure: CENTRAL LINE INSERTION;  Surgeon: Katha Cabal, MD;  Location: [facility];  Service: Vascular;;  . CHOLECYSTECTOMY    . COLONOSCOPY WITH PROPOFOL N/A 08/12/2016   Procedure: COLONOSCOPY WITH PROPOFOL;  Surgeon: Lollie Sails, MD;  Location: [facility];  Service: Endoscopy;  Laterality: N/A;  . DIALYSIS FISTULA CREATION Left    upper arm  . dialysis grafts    . DIALYSIS/PERMA CATHETER INSERTION N/A 11/14/2019   Procedure: DIALYSIS/PERMA CATHETER INSERTION;  Surgeon: Algernon Huxley, MD;  Location: [place] [facility];  Service: Cardiovascular;  Laterality: N/A;  . DIALYSIS/PERMA CATHETER INSERTION N/A 02/03/2020   Procedure: DIALYSIS/PERMA CATHETER INSERTION;  Surgeon: Katha Cabal, MD;  Location: [place] [facility];  Service: Cardiovascular;  Laterality: N/A;  . ESOPHAGOGASTRODUODENOSCOPY N/A 03/08/2015   Procedure: ESOPHAGOGASTRODUODENOSCOPY (EGD);  Surgeon: Manya Silvas, MD;  Location: [facility]COPY;  Service: Endoscopy;  Laterality: N/A;  . ESOPHAGOGASTRODUODENOSCOPY (EGD) WITH PROPOFOL N/A 03/18/2016   Procedure: ESOPHAGOGASTRODUODENOSCOPY (EGD) WITH PROPOFOL;  Surgeon: [name], MD;  Location: [facility];  Service: Endoscopy;  Laterality: N/A;  . EYE SURGERY Right 2018  . FECAL TRANSPLANT N/A 08/23/2015   Procedure: FECAL TRANSPLANT;  Surgeon: Manya Silvas, MD;  Location: [facility];  Service: Endoscopy;  Laterality: N/A;  . HAND  SURGERY Bilateral   . HEMATOMA EVACUATION Left 11/25/2019   Procedure: EVACUATION HEMATOMA;  Surgeon: Katha Cabal, MD;  Location: [facility];  Service: Vascular;  Laterality: Left;  . I & D EXTREMITY Left 11/25/2019   Procedure: IRRIGATION AND DEBRIDEMENT EXTREMITY;  Surgeon: Katha Cabal, MD;  Location: [facility];  Service: Vascular;  Laterality: Left;  . IR FLUORO GUIDE CV LINE RIGHT  04/06/2020  . IR RADIOLOGIST EVAL & MGMT  07/28/2019  . IR RADIOLOGIST EVAL & MGMT  08/11/2019  . LIGATION OF ARTERIOVENOUS  FISTULA Left 11/11/2019  . LIGATION OF ARTERIOVENOUS  FISTULA Left 11/11/2019   Procedure: LIGATION OF ARTERIOVENOUS  FISTULA;  Surgeon: Katha Cabal, MD;  Location: [facility];  Service: Vascular;  Laterality: Left;  .  PERIPHERAL VASCULAR CATHETERIZATION N/A 12/20/2015   Procedure: Thrombectomy of dialysis access versus permcath placement;  Surgeon: Algernon Huxley, MD;  Location: [place] [facility];  Service: Cardiovascular;  Laterality: N/A;  . PERIPHERAL VASCULAR CATHETERIZATION N/A 12/20/2015   Procedure: A/V Shunt Intervention;  Surgeon: Algernon Huxley, MD;  Location: [place] [facility];  Service: Cardiovascular;  Laterality: N/A;  . PERIPHERAL VASCULAR CATHETERIZATION N/A 12/20/2015   Procedure: A/V Shuntogram/Fistulagram;  Surgeon: Algernon Huxley, MD;  Location: [place] [facility];  Service: Cardiovascular;  Laterality: N/A;  . PERIPHERAL VASCULAR CATHETERIZATION N/A 01/02/2016   Procedure: A/V Shuntogram/Fistulagram;  Surgeon: Algernon Huxley, MD;  Location: [place] [facility];  Service: Cardiovascular;  Laterality: N/A;  . PERIPHERAL VASCULAR CATHETERIZATION N/A 01/02/2016   Procedure: A/V Shunt Intervention;  Surgeon: Algernon Huxley, MD;  Location: [place] [facility];  Service: Cardiovascular;  Laterality: N/A;  . UPPER EXTREMITY VENOGRAPHY Right 01/18/2020   Procedure: UPPER EXTREMITY VENOGRAPHY;  Surgeon: Katha Cabal, MD;  Location: [place] [facility];  Service:  Cardiovascular;  Laterality: Right;    Social History Social History   Tobacco Use  . Smoking status: Never Smoker  . Smokeless tobacco: Never Used  Vaping Use  . Vaping Use: Never used  Substance Use Topics  . Alcohol use: Not Currently    Comment: glass wine week per pt  . Drug use: Yes    Types: Marijuana    Comment: once a day    Family History Family History  Problem Relation Age of Onset  . Kidney disease Mother   . Diabetes Mother   . Cancer Father   . Kidney disease Sister     No family history of bleeding or clotting disorders, autoimmune disease or porphyria  Allergies  Allergen Reactions  . Ace Inhibitors Swelling and Anaphylaxis  . Ativan [Lorazepam] Other (See Comments)    Reaction:Hallucinations and headaches  . Compazine [Prochlorperazine Edisylate] Anaphylaxis, Nausea And Vomiting and Other (See Comments)    Other reaction(s): dystonia from this vs. Reglan, 23 Jul - patient relates that she takes promethazine frequently with no problems  . Sumatriptan Succinate Other (See Comments)    Other reaction(s): delirium and hallucinations per [facility] records  . Zofran [Ondansetron] Nausea And Vomiting    Per pt. she is allergic to zofran or will experience adverse reaction like hallucination   . [place] Nausea And Vomiting  . Losartan Nausea Only  . Prochlorperazine Other (See Comments)    Reaction:  Unknown . Patient does not remember reaction but she does have vertigo and anxiety along with n and v at times. Could be used to treat any of these   . Reglan [Metoclopramide] Other (See Comments)    Per patient her Dr. Evelina Bucy her off it   . Scopolamine Other (See Comments)    Dizziness, also has vertigo already  . Tape Rash    Plastic tape causes rash  . Tapentadol Rash     REVIEW OF SYSTEMS (Negative unless checked)  Constitutional: [] Weight loss  [] Fever  [] Chills Cardiac: [] Chest pain   [] Chest pressure   [] Palpitations   [] Shortness of breath when  laying flat   [] Shortness of breath at rest   [x] Shortness of breath with exertion. Vascular:  [] Pain in legs with walking   [] Pain in legs at rest   [] Pain in legs when laying flat   [] Claudication   [] Pain in feet when walking  [] Pain in feet at rest  []   Pain in feet when laying flat   [] History of DVT   [] Phlebitis   [] Swelling in legs   [] Varicose veins   [] Non-healing ulcers Pulmonary:   [] Uses home oxygen   [] Productive cough   [] Hemoptysis   [] Wheeze  [] COPD   [] Asthma Neurologic:  [] Dizziness  [] Blackouts   [] Seizures   [] History of stroke   [] History of TIA  [] Aphasia   [] Temporary blindness   [] Dysphagia   [] Weakness or numbness in arms   [] Weakness or numbness in legs Musculoskeletal:  [] Arthritis   [] Joint swelling   [] Joint pain   [] Low back pain Hematologic:  [] Easy bruising  [] Easy bleeding   [] Hypercoagulable state   [] Anemic  [] Hepatitis Gastrointestinal:  [] Blood in stool   [] Vomiting blood  [] Gastroesophageal reflux/heartburn   [] Difficulty swallowing. Genitourinary:  [x] Chronic kidney disease   [] Difficult urination  [] Frequent urination  [] Burning with urination   [] Blood in urine Skin:  [] Rashes   [] Ulcers   [] Wounds Psychological:  [] History of anxiety   []  History of major depression.  Physical Examination  Vitals:   04/13/20 0650  BP: (!) 164/65  Pulse: 61  Resp: 16  Temp: (!) 96.9 F (36.1 C)  TempSrc: Tympanic  SpO2: 99%  Weight: 81.1 kg  Height: 5' 5.5" (1.664 m)   Body mass index is 29.3 kg/m. Gen: WD/WN, NAD Head: [place]/AT, No temporalis wasting. Prominent temp pulse not noted. Ear/Nose/Throat: Hearing grossly intact, nares w/o erythema or drainage, oropharynx w/o Erythema/Exudate,  Eyes: Conjunctiva clear, sclera non-icteric Neck: Trachea midline.  No JVD.  Pulmonary:  Good air movement, respirations not labored, no use of accessory muscles.  Cardiac: RRR, normal S1, S2. Vascular: right IJ catheter CD&I Vessel Right Left  Radial Palpable Palpable  Ulnar  Not Palpable Not Palpable  Brachial Palpable Palpable  Carotid Palpable, without bruit Palpable, without bruit  Gastrointestinal: soft, non-tender/non-distended. No guarding/reflex.  Musculoskeletal: M/S 5/5 throughout.  Extremities without ischemic changes.  No deformity or atrophy.  Neurologic: Sensation grossly intact in extremities.  Symmetrical.  Speech is fluent. Motor exam as listed above. Psychiatric: Judgment intact, Mood & affect appropriate for pt's clinical situation. Dermatologic: No rashes or ulcers noted.  No cellulitis or open wounds. Lymph : No Cervical, Axillary, or Inguinal lymphadenopathy.   CBC Lab Results  Component Value Date   WBC 4.4 03/29/2020   HGB 6.5 (LL) 04/13/2020   HCT 19.0 (L) 04/13/2020   MCV 86.3 03/29/2020   PLT 148 (L) 03/29/2020    BMET    Component Value Date/Time   NA 136 04/13/2020 0705   NA 135 (L) 09/15/2014 0948   K 6.5 (HH) 04/13/2020 0705   K 4.8 09/15/2014 0948   CL 97 (L) 04/13/2020 0705   CL 99 09/15/2014 0948   CO2 24 04/13/2020 0703   CO2 26 09/15/2014 0948   GLUCOSE 64 (L) 04/13/2020 0705   GLUCOSE 118 (H) 09/15/2014 0948   BUN 26 (H) 04/13/2020 0705   BUN 19 (H) 09/15/2014 0948   CREATININE 5.70 (H) 04/13/2020 0705   CREATININE 6.79 (H) 09/15/2014 0948   CALCIUM 8.4 (L) 04/13/2020 0703   CALCIUM 8.3 (L) 09/15/2014 0948   GFRNONAA 8 (L) 04/13/2020 0703   GFRNONAA 7 (L) 09/15/2014 0948   GFRNONAA 6 (L) 05/31/2014 0432   GFRAA 9 (L) 04/13/2020 0703   GFRAA 8 (L) 09/15/2014 0948   GFRAA 7 (L) 05/31/2014 0432   Estimated Creatinine Clearance: 11.2 mL/min (A) (by C-G formula based on SCr of 5.7 mg/dL (  H)).  COAG Lab Results  Component Value Date   INR 1.0 04/13/2020   INR 1.1 03/29/2020   INR 1.3 (H) 11/25/2019    Radiology CT ABDOMEN PELVIS W WO CONTRAST  Result Date: 04/06/2020 CLINICAL DATA:  59 year old female with end-stage renal disease on hemodialysis for the past 14 years and a reported history of right  renal mass. EXAM: CT ABDOMEN AND PELVIS WITHOUT AND WITH CONTRAST TECHNIQUE: Multidetector CT imaging of the abdomen and pelvis was performed following the standard protocol before and following the bolus administration of intravenous contrast. CONTRAST:  162m OMNIPAQUE IOHEXOL 300 MG/ML  SOLN COMPARISON:  MRI 08/11/2019; CT scan of the abdomen and pelvis 12/07/2018 FINDINGS: Lower chest: Stable 2 mm right lower lobe pulmonary nodule dating back to at least March of 2020 (image 4 series 3). Mild peripheral subpleural reticulation and architectural distortion consistent with pulmonary fibrosis. Trace right-sided pleural effusion. Mild cardiomegaly. No pericardial effusion. There is a small hiatal hernia. Hepatobiliary: No focal liver abnormality is seen. Status post cholecystectomy. No biliary dilatation. Pancreas: Punctate calcifications in the uncinate process of the pancreas likely reflects sequelae from prior pancreatitis. No mass lesion or inflammatory changes. Spleen: Normal in size without focal abnormality. Adrenals/Urinary Tract: The adrenal glands are unremarkable. The known right upper pole enhancing renal masses are significantly more difficult to see on today's examination likely due to subtle differences in contrast bolus timing. However, the lesion in the posterior right upper pole remains unchanged in size at approximately 1.2 x 1.0 cm. Similarly, the lesion in the medial right upper pole slightly more inferior is also grossly unchanged at 1.2 x 1.1 cm. Additional small subcentimeter low-attenuation lesions in both kidneys remain too small for accurate characterization but statistically likely to represent benign cysts. Both kidneys are atrophied. No hydronephrosis or nephrolithiasis. Stomach/Bowel: Stomach is within normal limits. Appendix appears normal. No evidence of bowel wall thickening, distention, or inflammatory changes. Vascular/Lymphatic: No suspicious lymphadenopathy. Extensive  atherosclerotic calcifications throughout the aorta and its branch vessels. Both renal arteries appear to be patent without significant stenosis. Slight beaded appearance of the splenic artery may be secondary to atherosclerotic plaque along the tortuous vessel versus fibromuscular dysplasia. This finding is of dubious clinical significance. Other: No abdominal wall hernia or abnormality. No abdominopelvic ascites. Musculoskeletal: No acute fracture or aggressive appearing lytic or blastic osseous lesion. IMPRESSION: 1. No significant interval change in the size or morphology of the 2 small enhancing lesions in the upper pole of the right kidney compared to prior CT imaging from March of 2020. Of note, the lesions are more difficult to visualize on the present study likely due to differences in contrast bolus timing. 2.  Aortic Atherosclerosis (ICD10-170.0). 3. Additional ancillary findings as above. Signed, HCriselda Peaches MD, RStantonVascular and Interventional Radiology Specialists [facility] Hollywood[facility] Electronically Signed   By: HJacqulynn CadetM.D.   On: 04/06/2020 10:22   IR Fluoro Guide CV Line Right  Result Date: 04/06/2020 CLINICAL DATA:  Poor venous access. Request IV access for contrast-enhanced imaging study today. EXAM: ULTRASOUND GUIDED VENIPUNCTURE BY MD CONTRAST:  None COMPLICATIONS: None immediate TECHNIQUE: The right upper arm was prepped with chlorhexidine in a sterile fashion, and a sterile drape was applied covering the operative field. Local anesthesia was provided with 1% lidocaine. Under direct ultrasound guidance, the right brachial vein was accessed with a 5 [place] after the overlying soft tissues were anesthetized with 1% lidocaine. An ultrasound image was saved for documentation purposes. The  micropuncture sheath easily aspirated and flushed and was secured in place. A dressing was placed. The patient tolerated the procedure well without immediate post procedural  complication. IMPRESSION: Successful ultrasound guided placement of a right brachial vein approach micropuncture sheath for temporary venous access. Read by: Ascencion Dike PA-C Electronically Signed   By: Aletta Edouard M.D.   On: 04/06/2020 09:57    Assessment/Plan 1.  Complication dialysis device with thrombosis AV access:  Patient's catheter based access is being utilized.  However right arm HERO graft is planned to transition away from catheter based dialysis.   The risks and benefits were described to the patient.  All questions were answered.  The patient agrees to proceed with angiography and intervention. Potassium will be drawn to ensure that it is an appropriate level prior to performing intervention. 2.  End-stage renal disease requiring hemodialysis:  Patient will continue dialysis therapy without further interruption if a successful intervention is not achieved then a tunneled catheter will be placed. Dialysis has already been arranged. 3.  Hypertension:  Patient will continue medical management; nephrology is following no changes in oral medications. 4. Diabetes mellitus:  Glucose will be monitored and oral medications been held this morning once the patient has undergone the patient's procedure po intake will be reinitiated and again Accu-Cheks will be used to assess the blood glucose level and treat as needed. The patient will be restarted on the patient's usual hypoglycemic regime 5.  Coronary artery disease:  EKG will be monitored. Nitrates will be used if needed. The patient's oral cardiac medications will be continued.    Hortencia Pilar, MD  04/13/2020 7:24 AM

## 2020-04-13 NOTE — Transfer of Care (Signed)
Immediate Anesthesia Transfer of Care Note  Patient: [name]  Procedure(s) Performed: INSERTION OF HERO VASCULAR ACCESS DEVICE (GRAFT) (Right )  Patient Location: PACU  Anesthesia Type:General  Level of Consciousness: drowsy and responds to stimulation  Airway & Oxygen Therapy: Patient Spontanous Breathing and Patient connected to face mask oxygen  Post-op Assessment: Report given to RN and Post -op Vital signs reviewed and stable  Post vital signs: Reviewed and stable  Last Vitals:  Vitals Value Taken Time  BP    Temp    Pulse    Resp    SpO2      Last Pain:  Vitals:   04/13/20 0650  TempSrc: Tympanic  PainSc: 0-No pain         Complications: No complications documented.

## 2020-04-14 DIAGNOSIS — I132 Hypertensive heart and chronic kidney disease with heart failure and with stage 5 chronic kidney disease, or end stage renal disease: Secondary | ICD-10-CM | POA: Diagnosis not present

## 2020-04-14 LAB — CBC
HCT: 24.9 % — ABNORMAL LOW (ref 36.0–46.0)
Hemoglobin: 8 g/dL — ABNORMAL LOW (ref 12.0–15.0)
MCH: 29.1 pg (ref 26.0–34.0)
MCHC: 32.1 g/dL (ref 30.0–36.0)
MCV: 90.5 fL (ref 80.0–100.0)
Platelets: 108 10*3/uL — ABNORMAL LOW (ref 150–400)
RBC: 2.75 MIL/uL — ABNORMAL LOW (ref 3.87–5.11)
RDW: 17.2 % — ABNORMAL HIGH (ref 11.5–15.5)
WBC: 5.6 10*3/uL (ref 4.0–10.5)
nRBC: 0.5 % — ABNORMAL HIGH (ref 0.0–0.2)

## 2020-04-14 LAB — GLUCOSE, CAPILLARY
Glucose-Capillary: 181 mg/dL — ABNORMAL HIGH (ref 70–99)
Glucose-Capillary: 163 mg/dL — ABNORMAL HIGH (ref 70–99)

## 2020-04-14 LAB — BASIC METABOLIC PANEL
Anion gap: 11 (ref 5–15)
BUN: 31 mg/dL — ABNORMAL HIGH (ref 6–20)
CO2: 25 mmol/L (ref 22–32)
Calcium: 8.3 mg/dL — ABNORMAL LOW (ref 8.9–10.3)
Chloride: 97 mmol/L — ABNORMAL LOW (ref 98–111)
Creatinine, Ser: 6.06 mg/dL — ABNORMAL HIGH (ref 0.44–1.00)
GFR calc Af Amer: 8 mL/min — ABNORMAL LOW (ref >60)
GFR calc non Af Amer: 7 mL/min — ABNORMAL LOW (ref >60)
Glucose, Bld: 226 mg/dL — ABNORMAL HIGH (ref 70–99)
Potassium: 4.8 mmol/L (ref 3.5–5.1)
Sodium: 133 mmol/L — ABNORMAL LOW (ref 135–145)

## 2020-04-14 LAB — MAGNESIUM: Magnesium: 1.9 mg/dL (ref 1.7–2.4)

## 2020-04-14 NOTE — Progress Notes (Signed)
HD started. 

## 2020-04-14 NOTE — Plan of Care (Signed)
Continuing with plan of care. 

## 2020-04-14 NOTE — Progress Notes (Signed)
HD Assessment

## 2020-04-14 NOTE — Plan of Care (Signed)
Discharge teaching completed with patient who is in stable condition. 

## 2020-04-14 NOTE — Discharge Summary (Signed)
Physician Discharge Summary  Patient ID: [name] MRN: [id number] DOB/AGE: 1961/01/11 59 y.o.  Admit date: 04/13/2020 Discharge date: 04/14/2020  Admission Diagnoses:ESRD Discharge Diagnoses:  Active Problems:   ESRD (end stage renal disease) ([place])   Discharged Condition: good  1. Hospital Course:  2. Admitted forCreation of right arm prosthetic A-V graft, HERO graft 3. Placement of venous outflow, intravascular portion of HERO graft same venous access 4. Introduction catheter into superior vena cava 5. Removal of existing permacath right external jugular vein with placement of a new tunneled catheter right common femoral vein Ultrasound-guided placement of 8 French venous sheath, right neck  Consults: nephrology  Significant Diagnostic Studies: none  6. Treatments: surgery: Creation of right arm prosthetic A-V graft, HERO graft 7. Placement of venous outflow, intravascular portion of HERO graft same venous access 8. Introduction catheter into superior vena cava 9. Removal of existing permacath right external jugular vein with placement of a new tunneled catheter right common femoral vein Ultrasound-guided placement of 8 French venous sheath, right neck  Discharge Exam: Blood pressure (!) 152/58, pulse 69, temperature 98.4 F (36.9 C), temperature source Oral, resp. rate 20, height 5' 5.5" (1.664 m), weight 82.5 kg, SpO2 100 %. General appearance: alert and no distress Resp: clear to auscultation bilaterally Cardio: regular rate and rhythm Extremities: RIGHT Upper extremity- warm, incision, C/D/I palpable radial pulse, mild ecchymosis, appropriately tender RIGHT lower extremity: Catheter site- dry, no evidence of bleeding  Disposition: Discharge disposition: 01-Home or Self Care       Discharge Instructions    Call MD for:  redness, tenderness, or signs of infection (pain, swelling, bleeding, redness, odor or green/yellow discharge around incision site)   Complete by:  As directed    Call MD for:  redness, tenderness, or signs of infection (pain, swelling, bleeding, redness, odor or green/yellow discharge around incision site)   Complete by: As directed    Call MD for:  severe or increased pain, loss or decreased feeling  in affected limb(s)   Complete by: As directed    Call MD for:  severe or increased pain, loss or decreased feeling  in affected limb(s)   Complete by: As directed    Call MD for:  temperature >100.5   Complete by: As directed    Call MD for:  temperature >100.5   Complete by: As directed    Discharge instructions   Complete by: As directed    Please use the right femoral tunneled catheter for dialysis   Discharge instructions   Complete by: As directed    . Keep incision Clean and dry. Call/return for increased pain, redness or bleeding from incision or catheter.   Driving Restrictions   Complete by: As directed    No driving   Increase activity slowly   Complete by: As directed    Walk with assistance use walker or cane as needed   Lifting restrictions   Complete by: As directed    No lifting greater than 15 pounds left arm for 2 weeks   Lifting restrictions   Complete by: As directed    No lifting for 4 weeks   No dressing needed   Complete by: As directed    Replace only if drainage present   No dressing needed   Complete by: As directed    Replace only if drainage present   Resume previous diet   Complete by: As directed    Resume previous diet   Complete by: As directed  Allergies as of 04/14/2020      Reactions   Ace Inhibitors Swelling, Anaphylaxis   Ativan [lorazepam] Other (See Comments)   Reaction:Hallucinations and headaches   Compazine [prochlorperazine Edisylate] Anaphylaxis, Nausea And Vomiting, Other (See Comments)   Other reaction(s): dystonia from this vs. Reglan, 23 Jul - patient relates that she takes promethazine frequently with no problems   Sumatriptan Succinate Other (See Comments)    Other reaction(s): delirium and hallucinations per [facility] records   Zofran [ondansetron] Nausea And Vomiting   Per pt. she is allergic to zofran or will experience adverse reaction like hallucination    [place] Nausea And Vomiting   Losartan Nausea Only   Prochlorperazine Other (See Comments)   Reaction:  Unknown . Patient does not remember reaction but she does have vertigo and anxiety along with n and v at times. Could be used to treat any of these   Reglan [metoclopramide] Other (See Comments)   Per patient her Dr. Evelina Bucy her off it    Scopolamine Other (See Comments)   Dizziness, also has vertigo already   Tape Rash   Plastic tape causes rash   Tapentadol Rash      Medication List    TAKE these medications   albuterol (2.5 MG/3ML) 0.083% nebulizer solution Commonly known as: PROVENTIL Take 2.5 mg by nebulization every 4 (four) hours as needed for wheezing or shortness of breath.   albuterol 108 (90 Base) MCG/ACT inhaler Commonly known as: VENTOLIN HFA Inhale 2 puffs into the lungs every 4 (four) hours as needed for wheezing or shortness of breath.   amLODipine 10 MG tablet Commonly known as: NORVASC Take 10 mg by mouth daily.   ammonium lactate 12 % lotion Commonly known as: LAC-HYDRIN Apply 1 application topically in the morning and at bedtime.   aspirin EC 81 MG tablet Take 81 mg by mouth daily.   atorvastatin 20 MG tablet Commonly known as: LIPITOR Take 20 mg by mouth every evening.   carvedilol 6.25 MG tablet Commonly known as: COREG Take 6.25 mg by mouth 2 (two) times daily.   cinacalcet 30 MG tablet Commonly known as: SENSIPAR Take 30 mg by mouth daily.   citalopram 20 MG tablet Commonly known as: CELEXA Take 20 mg by mouth daily.   clopidogrel 75 MG tablet Commonly known as: PLAVIX Take 75 mg by mouth at bedtime.   Combivent Respimat 20-100 MCG/ACT Aers respimat Generic drug: Ipratropium-Albuterol Inhale 2 puffs into the lungs every 6 (six) hours  as needed for wheezing or shortness of breath. Shortness of breath   Creon 12000-38000 units Cpep capsule Generic drug: lipase/protease/amylase Take 12,000 Units by mouth See admin instructions. Take 36,000 with meals and 24,000 with snacks three times a day   cyanocobalamin 1000 MCG tablet Take 1,000 mcg by mouth daily.   diclofenac sodium 1 % Gel Commonly known as: VOLTAREN Apply 1 application topically in the morning and at bedtime.   diphenhydrAMINE 25 MG tablet Commonly known as: BENADRYL Take 25 mg by mouth every 6 (six) hours as needed for itching.   gabapentin 100 MG capsule Commonly known as: NEURONTIN Take 100 mg by mouth 2 (two) times daily.   hydrALAZINE 10 MG tablet Commonly known as: APRESOLINE Take 10 mg by mouth 3 (three) times daily.   HYDROcodone-acetaminophen 5-325 MG tablet Commonly known as: NORCO/VICODIN Take 1 tablet by mouth 4 (four) times daily as needed for moderate pain.   hydrOXYzine 25 MG tablet Commonly known as: ATARAX/VISTARIL Take  25 mg by mouth See admin instructions. Take on Tues, Thurs, and sat after dialysis Monday Wednesday Friday and Sunday daily in the morning   insulin aspart 100 UNIT/ML injection Commonly known as: novoLOG Inject 4-15 Units into the skin in the morning, at noon, in the evening, and at bedtime. Sliding scale   Iron 325 (65 Fe) MG Tabs Take 325 mg by mouth daily.   Levemir 100 UNIT/ML injection Generic drug: insulin detemir Inject 67 Units into the skin at bedtime.   meclizine 25 MG tablet Commonly known as: ANTIVERT Take 12.5 mg by mouth every 6 (six) hours.   multivitamin Tabs tablet Take 1 tablet by mouth daily.   mupirocin ointment 2 % Commonly known as: BACTROBAN Apply 1 application topically daily as needed (leg rash).   nitroGLYCERIN 0.4 MG SL tablet Commonly known as: NITROSTAT Place 0.4 mg under the tongue every 5 (five) minutes as needed for chest pain.   Omega-3 300 MG Caps Take 300 mg by  mouth 2 (two) times daily. Every other day opposite omega XL   oxyCODONE-acetaminophen 5-325 MG tablet Commonly known as: Percocet Take 1-2 tablets by mouth every 6 (six) hours as needed for moderate pain or severe pain.   pantoprazole 40 MG tablet Commonly known as: PROTONIX Take 40 mg by mouth daily.   PROBIOTIC PO Take 1 capsule by mouth daily.   promethazine 12.5 MG tablet Commonly known as: PHENERGAN Take 1 tablet (12.5 mg total) by mouth every 8 (eight) hours as needed for nausea.   QC TUMERIC COMPLEX PO Take 250 mg by mouth daily.   sevelamer carbonate 800 MG tablet Commonly known as: RENVELA Take 800-2,400 mg by mouth See admin instructions. Take 2400 mg by mouth 3 times daily with meals and take [phone] mg by mouth with snacks   VITA-C PO Take 1 tablet by mouth daily.   Vitamin D3 25 MCG (1000 UT) Caps Take 1,000 Units by mouth daily.   Zinc 50 MG Caps Take 50 mg by mouth daily.       Follow-up Information    Schnier, Dolores Lory, MD Follow up in 2 week(s).   Specialties: Vascular Surgery, Cardiology, Radiology, Vascular Surgery Why: with an HDA Contact information: [place] [id number] [phone]               Signed: Evaristo Bury 04/14/2020, 8:56 AM

## 2020-04-14 NOTE — Progress Notes (Signed)
[facility]  ROUNDING NOTE   Subjective:   Underwent HD today. Tolerated well. Hero graft was placed yesterday. Hgb up to 8.    Objective:  Vital signs in last 24 hours:  Temp:  [97.6 F (36.4 C)-98.4 F (36.9 C)] 98.1 F (36.7 C) (07/24 1345) Pulse Rate:  [59-71] 69 (07/24 1345) Resp:  [14-20] 16 (07/24 1345) BP: (116-170)/(51-76) 157/59 (07/24 1345) SpO2:  [97 %-100 %] 100 % (07/24 1345) Weight:  [82.5 kg] 82.5 kg (07/24 0500)  Weight change: 1.364 kg Filed Weights   04/13/20 0650 04/14/20 0500  Weight: 81.1 kg 82.5 kg    Intake/Output: I/O last 3 completed shifts: In: 64 [P.O.:240; I.V.:325] Out: 0    Intake/Output this shift:  Total I/O In: -  Out: 1900 [Other:1900]  Physical Exam: General: NAD,   Head: Normocephalic, atraumatic. Moist oral mucosal membranes  Eyes: Anicteric  Neck: Supple, trachea midline  Lungs:  Clear to auscultation, normal effort  Heart: Regular rate and rhythm  Abdomen:  Soft, nontender, BS present  Extremities: trace peripheral edema.  Neurologic: Nonfocal, moving all four extremities  Skin: No lesions  Access: R femoral permcath    Basic Metabolic Panel: Recent Labs  Lab 04/13/20 0703 04/13/20 0705 04/14/20 1049  NA 137 136 133*  K 4.8 6.5* 4.8  CL 99 97* 97*  CO2 24  --  25  GLUCOSE 67* 64* 226*  BUN 21* 26* 31*  CREATININE 5.30* 5.70* 6.06*  CALCIUM 8.4*  --  8.3*  MG  --   --  1.9    Liver Function Tests: No results for input(s): AST, ALT, ALKPHOS, BILITOT, PROT, ALBUMIN in the last 168 hours. No results for input(s): LIPASE, AMYLASE in the last 168 hours. No results for input(s): AMMONIA in the last 168 hours.  CBC: Recent Labs  Lab 04/13/20 0705 04/14/20 1049  WBC  --  5.6  HGB 6.5* 8.0*  HCT 19.0* 24.9*  MCV  --  90.5  PLT  --  108*    Cardiac Enzymes: No results for input(s): CKTOTAL, CKMB, CKMBINDEX, TROPONINI in the last 168 hours.  BNP: Invalid input(s):  POCBNP  CBG: Recent Labs  Lab 04/13/20 1620 04/13/20 1759 04/13/20 2149 04/14/20 0811 04/14/20 1430  GLUCAP 235* 237* 285* 181* 163*    Microbiology: Results for orders placed or performed during the hospital encounter of 04/10/20  SARS CORONAVIRUS 2 (TAT 6-24 HRS) Nasopharyngeal Nasopharyngeal Swab     Status: None   Collection Time: 04/10/20 10:49 AM   Specimen: Nasopharyngeal Swab  Result Value Ref Range Status   SARS Coronavirus 2 NEGATIVE NEGATIVE Final    Comment: (NOTE) SARS-CoV-2 target nucleic acids are NOT DETECTED.  The SARS-CoV-2 RNA is generally detectable in upper and lower respiratory specimens during the acute phase of infection. Negative results do not preclude SARS-CoV-2 infection, do not rule out co-infections with other pathogens, and should not be used as the sole basis for treatment or other patient management decisions. Negative results must be combined with clinical observations, patient history, and epidemiological information. The expected result is Negative.  Fact Sheet for Patients: SugarRoll.be  Fact Sheet for Healthcare Providers: https://www.woods-mathews.com/  This test is not yet approved or cleared by the [place] FDA and  has been authorized for detection and/or diagnosis of SARS-CoV-2 by FDA under an Emergency Use Authorization (EUA). This EUA will remain  in effect (meaning this test can be used) for the duration of the COVID-19 declaration under Se  ction 564(b)(1) of the Act, 21 U.S.C. section 360bbb-3(b)(1), unless the authorization is terminated or revoked sooner.  Performed at [facility], [place] [address] [place], [place] [address]     Coagulation Studies: Recent Labs    04/13/20 0703  LABPROT 12.6  INR 1.0    Urinalysis: No results for input(s): COLORURINE, LABSPEC, PHURINE, GLUCOSEU, HGBUR, BILIRUBINUR, KETONESUR, PROTEINUR, UROBILINOGEN, NITRITE, LEUKOCYTESUR in  the last 72 hours.  Invalid input(s): APPERANCEUR    Imaging: DG C-Arm 1-60 Min-No Report  Result Date: 04/13/2020 Fluoroscopy was utilized by the requesting physician.  No radiographic interpretation.   DG C-Arm 1-60 Min-No Report  Result Date: 04/13/2020 Fluoroscopy was utilized by the requesting physician.  No radiographic interpretation.     Medications:    . amLODipine  10 mg Oral Daily  . aspirin EC  81 mg Oral Daily  . atorvastatin  20 mg Oral QPM  . carvedilol  6.25 mg Oral BID  . Chlorhexidine Gluconate Cloth  6 each Topical Q0600  . cinacalcet  30 mg Oral Daily  . citalopram  20 mg Oral Daily  . clopidogrel  75 mg Oral QHS  . epoetin (EPOGEN/PROCRIT) injection  10,000 Units Intravenous Q T,Th,Sa-HD  . gabapentin  100 mg Oral BID  . hydrALAZINE  10 mg Oral TID  . insulin aspart  0-5 Units Subcutaneous QHS  . insulin aspart  0-6 Units Subcutaneous TID WC  . lipase/protease/amylase  36,000 Units Oral TID with meals  . pantoprazole  40 mg Oral Daily  . sevelamer carbonate  2,400 mg Oral TID with meals   albuterol, albuterol, Ipratropium-Albuterol, lipase/protease/amylase, morphine injection, nitroGLYCERIN, oxyCODONE, sevelamer carbonate  Assessment/ Plan:  [name] is a 59 y.o. black female with end stage renal disease on hemodialysis for 14 years, diabetes mellitus type II insulin dependent, pancreatic deficiency, hypertension, history of renal cancer, peripheral vascular disease, coronary artery disease, GERD, COPD who has been admitted on 04/13/2020 post op for AVG placement by Dr. Delana Meyer  [facility] RIJ permcath 81kg  1. End Stage Renal Disease:  - Pt had HD, tolerated well, next HD on Tuesday.  2. Hypertension: Continue amlodipine, carvedilol, and hydralazine.    3. Anemia of chronic kidney disease: Hgb up to 8 now, no further need for transfusion.   4. Secondary Hyperparathyroidism: outpatient labs on 7/13 PTH 979, phos 6.6, calcium  8.2 - Continue renvela and sensipar.    LOS: 0 Jayvyn Haselton 7/24/20212:45 PM

## 2020-04-14 NOTE — Progress Notes (Signed)
HD Assessment.

## 2020-04-15 LAB — HEPATITIS B SURFACE ANTIGEN: Hepatitis B Surface Ag: NONREACTIVE

## 2020-04-16 ENCOUNTER — Encounter: Payer: Self-pay | Admitting: Vascular Surgery

## 2020-04-16 DIAGNOSIS — Z7951 Long term (current) use of inhaled steroids: Secondary | ICD-10-CM | POA: Diagnosis not present

## 2020-04-16 DIAGNOSIS — T82838A Hemorrhage of vascular prosthetic devices, implants and grafts, initial encounter: Secondary | ICD-10-CM | POA: Insufficient documentation

## 2020-04-16 DIAGNOSIS — F1290 Cannabis use, unspecified, uncomplicated: Secondary | ICD-10-CM | POA: Insufficient documentation

## 2020-04-16 DIAGNOSIS — I132 Hypertensive heart and chronic kidney disease with heart failure and with stage 5 chronic kidney disease, or end stage renal disease: Secondary | ICD-10-CM | POA: Insufficient documentation

## 2020-04-16 DIAGNOSIS — I5031 Acute diastolic (congestive) heart failure: Secondary | ICD-10-CM | POA: Insufficient documentation

## 2020-04-16 DIAGNOSIS — Y638 Failure in dosage during other surgical and medical care: Secondary | ICD-10-CM | POA: Diagnosis not present

## 2020-04-16 DIAGNOSIS — E1140 Type 2 diabetes mellitus with diabetic neuropathy, unspecified: Secondary | ICD-10-CM | POA: Insufficient documentation

## 2020-04-16 DIAGNOSIS — Z79899 Other long term (current) drug therapy: Secondary | ICD-10-CM | POA: Insufficient documentation

## 2020-04-16 DIAGNOSIS — N186 End stage renal disease: Secondary | ICD-10-CM | POA: Diagnosis not present

## 2020-04-16 DIAGNOSIS — I2510 Atherosclerotic heart disease of native coronary artery without angina pectoris: Secondary | ICD-10-CM | POA: Insufficient documentation

## 2020-04-16 DIAGNOSIS — Z7982 Long term (current) use of aspirin: Secondary | ICD-10-CM | POA: Diagnosis not present

## 2020-04-16 DIAGNOSIS — J449 Chronic obstructive pulmonary disease, unspecified: Secondary | ICD-10-CM | POA: Diagnosis not present

## 2020-04-16 DIAGNOSIS — Z452 Encounter for adjustment and management of vascular access device: Secondary | ICD-10-CM | POA: Diagnosis present

## 2020-04-16 DIAGNOSIS — Z794 Long term (current) use of insulin: Secondary | ICD-10-CM | POA: Insufficient documentation

## 2020-04-16 LAB — POCT I-STAT, CHEM 8
BUN: 26 mg/dL — ABNORMAL HIGH (ref 6–20)
Calcium, Ion: 1.09 mmol/L — ABNORMAL LOW (ref 1.15–1.40)
Chloride: 97 mmol/L — ABNORMAL LOW (ref 98–111)
Creatinine, Ser: 5.7 mg/dL — ABNORMAL HIGH (ref 0.44–1.00)
Glucose, Bld: 64 mg/dL — ABNORMAL LOW (ref 70–99)
HCT: 19 % — ABNORMAL LOW (ref 36.0–46.0)
Hemoglobin: 6.5 g/dL — CL (ref 12.0–15.0)
Potassium: 6.5 mmol/L (ref 3.5–5.1)
Sodium: 136 mmol/L (ref 135–145)
TCO2: 32 mmol/L (ref 22–32)

## 2020-04-16 LAB — HEPATITIS B SURFACE ANTIBODY, QUANTITATIVE: Hep B S AB Quant (Post): >1000 m[IU]/mL (ref >9.9)

## 2020-04-16 NOTE — ED Triage Notes (Signed)
Pt arrived via EMS from home where she called out for the second time tonight due to bleeding from RT femoral port. Port placed on Friday 7/23. Pt refused EMS 1st due to blood under control. Pt called due to bleeding again post 1st EMS house call. Bleeding controlled at this time. Bandage is clean.

## 2020-04-17 ENCOUNTER — Other Ambulatory Visit: Payer: Self-pay

## 2020-04-17 ENCOUNTER — Emergency Department
Admission: EM | Admit: 2020-04-17 | Discharge: 2020-04-17 | Disposition: A | Discharge status: Home / Self Care | Payer: Medicare Other | Attending: Emergency Medicine | Admitting: Emergency Medicine

## 2020-04-17 DIAGNOSIS — T82838A Hemorrhage of vascular prosthetic devices, implants and grafts, initial encounter: Secondary | ICD-10-CM

## 2020-04-17 NOTE — Discharge Instructions (Signed)
We have contacted your dialysis center and they will arrange for you to have dialysis later today

## 2020-04-17 NOTE — ED Notes (Signed)
See triage note  States she noticed some bleeding from her vascular access   Had new access paled last week  Dried blood noted on arrival

## 2020-04-17 NOTE — ED Provider Notes (Signed)
[facility] Emergency Department Provider Note   ____________________________________________    I have reviewed the triage vital signs and the nursing notes.   HISTORY  Chief Complaint Vascular Access Problem     HPI [name] is a 59 y.o. female with extensive past medical history below including end-stage renal disease who recently had right leg dialysis catheter placed.  She reports started bleeding from around catheter last night and she was able to stop it on its own however it started again later in the morning and she presents now.  She reports she put gauze on it and Tegaderm and it is no longer bleeding.  She has no other complaints.  She is due for dialysis today.  Past Medical History:  Diagnosis Date  . Anemia   . Anginal pain ([place])   . Anxiety   . Arthritis   . Asthma   . Broken wrist   . Bronchitis   . chronic diastolic CHF 12/23/4740  . Chronic kidney disease   . COPD (chronic obstructive pulmonary disease) ([place])   . Coronary artery disease    a. cath 2013: stenting to RCA (report not available); b. cath 2014: LM nl, pLAD 40%, mLAD nl, ost LCx 40%, mid LCx nl, pRCA 30% @ site of prior stent, mRCA 50%  . Depression   . Diabetes mellitus ([place])   . Diabetes mellitus without complication ([place])   . Diabetic neuropathy ([place])   . dialysis 2006  . Diverticulosis   . Dizziness   . Dyspnea   . Elevated lipids   . Environmental and seasonal allergies   . ESRD (end stage renal disease) on dialysis ([place])    M-W-F  . Gastroparesis   . GERD (gastroesophageal reflux disease)   . Headache   . History of anemia due to chronic kidney disease   . History of hiatal hernia   . HOH (hard of hearing)   . Hx of pancreatitis 2015  . Hypertension   . Lower extremity edema   . Mitral regurgitation    a. echo 10/2013: EF 62%, noWMA, mildly dilated LA, mild to mod MR/TR, GR1DD  . Myocardial infarction ([place])   . Orthopnea   . Parathyroid  abnormality ([place])   . Peripheral arterial disease ([place])   . Pneumonia   . Renal cancer ([place])   . Renal insufficiency    Pt is on dialysis on M,W + F.  . Wheezing     Patient Active Problem List   Diagnosis Date Noted  . ESRD (end stage renal disease) ([place]) 04/13/2020  . Hematoma of arm, left, subsequent encounter 11/24/2019  . Respiratory failure ([place])   . Shortness of breath   . Complication of arteriovenous dialysis fistula 11/13/2019  . PVD (peripheral vascular disease) ([place]) 11/13/2019  . Severe anemia 11/13/2019  . Pressure injury of skin 11/13/2019  . ESRD on hemodialysis ([place])   . Hemorrhagic shock ([place]) 11/11/2019  . Leg pain 10/02/2019  . CAP (community acquired pneumonia) 10/01/2019  . Atherosclerosis of native arteries of extremity with intermittent claudication ([place]) 05/01/2019  . Right-sided headache   . COPD (chronic obstructive pulmonary disease) ([place]) 10/06/2018  . Syncope 10/04/2018  . Symptomatic anemia 06/18/2018  . Gastroparesis due to DM ([place]) 01/18/2018  . Complication of vascular access for dialysis 12/04/2017  . Osteomyelitis ([place]) 09/30/2017  . Carotid stenosis 06/18/2017  . Shortness of breath 05/04/2017  . Cellulitis of lower extremity 07/29/2016  . Chronic venous insufficiency  07/29/2016  . Lymphedema 07/29/2016  . TIA (transient ischemic attack) 04/21/2016  . Altered mental status 04/08/2016  . Hyperammonemia ([place]) 04/08/2016  . Elevated troponin 04/08/2016  . Depression 04/08/2016  . Depression, major, recurrent, severe with psychosis ([place]) 04/08/2016  . Blood in stool   . Intractable cyclical vomiting with nausea   . Reflux esophagitis   . Gastritis   . Generalized abdominal pain   . Uncontrollable vomiting   . Major depressive disorder, recurrent episode, moderate ([place]) 03/15/2016  . Adjustment disorder with mixed anxiety and depressed mood 03/15/2016  . Malnutrition of moderate degree 12/01/2015  . Renal mass   . Dyspnea   . Acute  renal failure ([place])   . Respiratory failure ([place])   . High temperature 11/14/2015  . Pulmonary edema   . Encounter for central line placement   . Encounter for orogastric (OG) tube placement   . Nausea 11/12/2015  . Hyperkalemia 10/03/2015  . Diarrhea, unspecified 07/22/2015  . Pneumonia 05/21/2015  . Hypoglycemia 04/24/2015  . Unresponsiveness 04/24/2015  . Bradycardia 04/24/2015  . Hypothermia 04/24/2015  . Acute respiratory failure ([place]) 04/24/2015  . Acute diastolic CHF (congestive heart failure) ([place]) 04/05/2015  . Diabetic gastroparesis ([place]) 04/05/2015  . Hypokalemia 04/05/2015  . Generalized weakness 04/05/2015  . Acute pulmonary edema ([place]) 04/03/2015  . Nausea and vomiting 04/03/2015  . Hypoglycemia associated with diabetes ([place]) 04/03/2015  . Anemia of chronic disease 04/03/2015  . Secondary hyperparathyroidism ([place]) 04/03/2015  . Pressure ulcer 04/02/2015  . Acute respiratory failure with hypoxia ([place]) 04/01/2015  . Adjustment disorder with anxiety 03/14/2015  . Somatic symptom disorder, mild 03/08/2015  . Coronary artery disease involving native coronary artery of native heart without angina pectoris   . Nausea & vomiting 03/06/2015  . Abdominal pain 03/06/2015  . DM (diabetes mellitus) ([place]) 03/06/2015  . HTN (hypertension) 03/06/2015  . Gastroparesis 02/24/2015  . Pleural effusion 02/19/2015  . HCAP (healthcare-associated pneumonia) 02/19/2015  . End-stage renal disease on hemodialysis ([place]) 02/19/2015    Past Surgical History:  Procedure Laterality Date  . A/V SHUNTOGRAM Left 01/20/2018   Procedure: A/V SHUNTOGRAM;  Surgeon: Algernon Huxley, MD;  Location: [facility];  Service: Cardiovascular;  Laterality: Left;  . ABDOMINAL HYSTERECTOMY  1992  . AMPUTATION TOE Left 10/02/2017   Procedure: AMPUTATION TOE-LEFT GREAT TOE;  Surgeon: Albertine Patricia, DPM;  Location: [facility];  Service: Podiatry;  Laterality: Left;  . APPENDECTOMY    . APPLICATION OF  WOUND VAC N/A 11/25/2019   Procedure: APPLICATION OF WOUND VAC;  Surgeon: Katha Cabal, MD;  Location: [facility];  Service: Vascular;  Laterality: N/A;  . ARTERY BIOPSY Right 10/11/2018   Procedure: BIOPSY TEMPORAL ARTERY;  Surgeon: Vickie Epley, MD;  Location: [facility];  Service: General;  Laterality: Right;  . CARDIAC CATHETERIZATION Left 07/26/2015   Procedure: Left Heart Cath and Coronary Angiography;  Surgeon: Dionisio David, MD;  Location: [facility];  Service: Cardiovascular;  Laterality: Left;  . CATARACT EXTRACTION W/ INTRAOCULAR LENS IMPLANT Right   . CATARACT EXTRACTION W/PHACO Left 03/10/2017   Procedure: CATARACT EXTRACTION PHACO AND INTRAOCULAR LENS PLACEMENT (IOC);  Surgeon: Birder Robson, MD;  Location: [facility];  Service: Ophthalmology;  Laterality: Left;  [place] 00:51.9 AP% 14.2 CDE 7.39 fluid pack lot # [id number]  . CENTRAL LINE INSERTION Right 11/11/2019   Procedure: CENTRAL LINE INSERTION;  Surgeon: Katha Cabal, MD;  Location: [facility];  Service: Vascular;  Laterality: Right;  .  CENTRAL LINE INSERTION  11/25/2019   Procedure: CENTRAL LINE INSERTION;  Surgeon: Katha Cabal, MD;  Location: [facility];  Service: Vascular;;  . CHOLECYSTECTOMY    . COLONOSCOPY WITH PROPOFOL N/A 08/12/2016   Procedure: COLONOSCOPY WITH PROPOFOL;  Surgeon: Lollie Sails, MD;  Location[facility]OSCOPY;  Service: Endoscopy;  Laterality: N/A;  . DIALYSIS FISTULA CREATION Left    upper arm  . dialysis grafts    . DIALYSIS/PERMA CATHETER INSERTION N/A 11/14/2019   Procedure: DIALYSIS/PERMA CATHETER INSERTION;  Surgeon: Algernon Huxley, MD;  Location: [facility];  Service: Cardiovascular;  Laterality: N/A;  . DIALYSIS/PERMA CATHETER INSERTION N/A 02/03/2020   Procedure: DIALYSIS/PERMA CATHETER INSERTION;  Surgeon: Katha Cabal, MD;  Location: [facility];  Service: Cardiovascular;  Laterality: N/A;  . ESOPHAGOGASTRODUODENOSCOPY N/A 03/08/2015    Procedure: ESOPHAGOGASTRODUODENOSCOPY (EGD);  Surgeon: Manya Silvas, MD;  Location: [facility]OSCOPY;  Service: Endoscopy;  Laterality: N/A;  . ESOPHAGOGASTRODUODENOSCOPY (EGD) WITH PROPOFOL N/A 03/18/2016   Procedure: ESOPHAGOGASTRODUODENOSCOPY (EGD) WITH PROPOFOL;  Surgeon: [name], MD;  Location: [facility] ENDOSCOPY;  Service: Endoscopy;  Laterality: N/A;  . EYE SURGERY Right 2018  . FECAL TRANSPLANT N/A 08/23/2015   Procedure: FECAL TRANSPLANT;  Surgeon: Manya Silvas, MD;  Location: [facility] ENDOSCOPY;  Service: Endoscopy;  Laterality: N/A;  . HAND SURGERY Bilateral   . HEMATOMA EVACUATION Left 11/25/2019   Procedure: EVACUATION HEMATOMA;  Surgeon: Katha Cabal, MD;  Location: [facility];  Service: Vascular;  Laterality: Left;  . I & D EXTREMITY Left 11/25/2019   Procedure: IRRIGATION AND DEBRIDEMENT EXTREMITY;  Surgeon: Katha Cabal, MD;  Location: [facility];  Service: Vascular;  Laterality: Left;  . IR FLUORO GUIDE CV LINE RIGHT  04/06/2020  . IR RADIOLOGIST EVAL & MGMT  07/28/2019  . IR RADIOLOGIST EVAL & MGMT  08/11/2019  . LIGATION OF ARTERIOVENOUS  FISTULA Left 11/11/2019  . LIGATION OF ARTERIOVENOUS  FISTULA Left 11/11/2019   Procedure: LIGATION OF ARTERIOVENOUS  FISTULA;  Surgeon: Katha Cabal, MD;  Location: [facility];  Service: Vascular;  Laterality: Left;  . PERIPHERAL VASCULAR CATHETERIZATION N/A 12/20/2015   Procedure: Thrombectomy of dialysis access versus permcath placement;  Surgeon: Algernon Huxley, MD;  Location: [facility];  Service: Cardiovascular;  Laterality: N/A;  . PERIPHERAL VASCULAR CATHETERIZATION N/A 12/20/2015   Procedure: A/V Shunt Intervention;  Surgeon: Algernon Huxley, MD;  Location: [facility];  Service: Cardiovascular;  Laterality: N/A;  . PERIPHERAL VASCULAR CATHETERIZATION N/A 12/20/2015   Procedure: A/V Shuntogram/Fistulagram;  Surgeon: Algernon Huxley, MD;  Location: [facility];  Service: Cardiovascular;  Laterality: N/A;  .  PERIPHERAL VASCULAR CATHETERIZATION N/A 01/02/2016   Procedure: A/V Shuntogram/Fistulagram;  Surgeon: Algernon Huxley, MD;  Location: [facility];  Service: Cardiovascular;  Laterality: N/A;  . PERIPHERAL VASCULAR CATHETERIZATION N/A 01/02/2016   Procedure: A/V Shunt Intervention;  Surgeon: Algernon Huxley, MD;  Location: [facility];  Service: Cardiovascular;  Laterality: N/A;  . UPPER EXTREMITY VENOGRAPHY Right 01/18/2020   Procedure: UPPER EXTREMITY VENOGRAPHY;  Surgeon: Katha Cabal, MD;  Location: [facility];  Service: Cardiovascular;  Laterality: Right;  Marland Kitchen VASCULAR ACCESS DEVICE INSERTION Right 04/13/2020   Procedure: INSERTION OF HERO VASCULAR ACCESS DEVICE (GRAFT);  Surgeon: Katha Cabal, MD;  Location: [facility];  Service: Vascular;  Laterality: Right;    Prior to Admission medications   Medication Sig Start Date End Date Taking?  Authorizing Provider  albuterol (PROVENTIL) (2.5 MG/3ML) 0.083% nebulizer solution Take 2.5 mg by nebulization every 4 (four) hours as needed for wheezing or shortness of breath.    [provider]  albuterol (VENTOLIN HFA) 108 (90 Base) MCG/ACT inhaler Inhale 2 puffs into the lungs every 4 (four) hours as needed for wheezing or shortness of breath. 11/09/19   [provider]  amLODipine (NORVASC) 10 MG tablet Take 10 mg by mouth daily.  10/24/19   [provider]  ammonium lactate (LAC-HYDRIN) 12 % lotion Apply 1 application topically in the morning and at bedtime.    [provider]  Ascorbic Acid (VITA-C PO) Take 1 tablet by mouth daily.    [provider]  aspirin EC 81 MG tablet Take 81 mg by mouth daily.    [provider]  atorvastatin (LIPITOR) 20 MG tablet Take 20 mg by mouth every evening.  10/24/19   [provider]  carvedilol (COREG) 6.25 MG tablet Take 6.25 mg by mouth 2 (two) times daily. 10/24/19   [provider]  Cholecalciferol (VITAMIN D3) 25 MCG (1000  UT) CAPS Take 1,000 Units by mouth daily.     [provider]  cinacalcet (SENSIPAR) 30 MG tablet Take 30 mg by mouth daily.    [provider]  citalopram (CELEXA) 20 MG tablet Take 20 mg by mouth daily. 10/29/19   [provider]  clopidogrel (PLAVIX) 75 MG tablet Take 75 mg by mouth at bedtime.  07/01/19   [provider]  COMBIVENT RESPIMAT 20-100 MCG/ACT AERS respimat Inhale 2 puffs into the lungs every 6 (six) hours as needed for wheezing or shortness of breath. Shortness of breath 11/30/19   [provider]  CREON 12000 units CPEP capsule Take 12,000 Units by mouth See admin instructions. Take 36,000 with meals and 24,000 with snacks three times a day 10/21/19   [provider]  cyanocobalamin 1000 MCG tablet Take 1,000 mcg by mouth daily.     [provider]  diclofenac sodium (VOLTAREN) 1 % GEL Apply 1 application topically in the morning and at bedtime.     [provider]  diphenhydrAMINE (BENADRYL) 25 MG tablet Take 25 mg by mouth every 6 (six) hours as needed for itching.    [provider]  Ferrous Sulfate (IRON) 325 (65 Fe) MG TABS Take 325 mg by mouth daily.     [provider]  gabapentin (NEURONTIN) 100 MG capsule Take 100 mg by mouth 2 (two) times daily.  10/29/19   [provider]  hydrALAZINE (APRESOLINE) 10 MG tablet Take 10 mg by mouth 3 (three) times daily.     [provider]  HYDROcodone-acetaminophen (NORCO/VICODIN) 5-325 MG tablet Take 1 tablet by mouth 4 (four) times daily as needed for moderate pain.  10/20/19   [provider]  hydrOXYzine (ATARAX/VISTARIL) 25 MG tablet Take 25 mg by mouth See admin instructions. Take on Tues, Thurs, and sat after dialysis Monday Wednesday Friday and Sunday daily in the morning    [provider]  insulin aspart (NOVOLOG) 100 UNIT/ML injection Inject 4-15 Units into the skin in the morning, at noon, in the evening, and at  bedtime. Sliding scale    [provider]  insulin detemir (LEVEMIR) 100 UNIT/ML injection Inject 67 Units into the skin at bedtime.    [provider]  meclizine (ANTIVERT) 25 MG tablet Take 12.5 mg by mouth every 6 (six) hours.  11/01/19   [provider]  multivitamin (RENA-VIT) TABS tablet Take 1 tablet by mouth daily.     [provider]  mupirocin ointment (BACTROBAN) 2 % Apply 1 application topically daily as needed (leg rash).  11/09/19   [provider]  nitroGLYCERIN (NITROSTAT) 0.4 MG SL tablet Place 0.4 mg under the tongue every 5 (five) minutes as needed for chest pain.     [provider]  Omega-3 300 MG CAPS Take 300 mg by mouth 2 (two) times daily. Every other day opposite omega XL    [provider]  oxyCODONE-acetaminophen (PERCOCET) 5-325 MG tablet Take 1-2 tablets by mouth every 6 (six) hours as needed for moderate pain or severe pain. 04/13/20 04/13/21  Schnier, Dolores Lory, MD  pantoprazole (PROTONIX) 40 MG tablet Take 40 mg by mouth daily. 08/28/19   [provider]  Probiotic Product (PROBIOTIC PO) Take 1 capsule by mouth daily.    [provider]  promethazine (PHENERGAN) 12.5 MG tablet Take 1 tablet (12.5 mg total) by mouth every 8 (eight) hours as needed for nausea. 11/17/19   Lorella Nimrod, MD  sevelamer carbonate (RENVELA) 800 MG tablet Take 800-2,400 mg by mouth See admin instructions. Take 2400 mg by mouth 3 times daily with meals and take [phone] mg by mouth with snacks    [provider]  Turmeric (QC TUMERIC COMPLEX PO) Take 250 mg by mouth daily.     [provider]  Zinc 50 MG CAPS Take 50 mg by mouth daily.     [provider]     Allergies Ace inhibitors, Ativan [lorazepam], Compazine [prochlorperazine edisylate], Sumatriptan succinate, Zofran [ondansetron], Lac bovis, Losartan, Prochlorperazine, Reglan [metoclopramide], Scopolamine, Tape, and Tapentadol  Family  History  Problem Relation Age of Onset  . Kidney disease Mother   . Diabetes Mother   . Cancer Father   . Kidney disease Sister     Social History Social History   Tobacco Use  . Smoking status: Never Smoker  . Smokeless tobacco: Never Used  Vaping Use  . Vaping Use: Never used  Substance Use Topics  . Alcohol use: Not Currently    Comment: glass wine week per pt  . Drug use: Yes    Types: Marijuana    Comment: once a day    Review of Systems       Musculoskeletal: Bleeding from right leg, no numbness or tingling, no rash Skin: Negative for rash. Neurological: Negative for headaches     ____________________________________________   PHYSICAL EXAM:  VITAL SIGNS: ED Triage Vitals  Enc Vitals Group     BP 04/16/20 2349 (!) 148/52     Pulse Rate 04/16/20 2349 60     Resp 04/16/20 2349 18     Temp 04/16/20 2349 98.9 F (37.2 C)     Temp Source 04/16/20 2349 Oral     SpO2 04/16/20 2349 97 %     Weight --      Height --      Head Circumference --      Peak Flow --      Pain Score 04/17/20 0937 0     Pain Loc --      Pain Edu? --      Excl. in [place]? --     Constitutional: Alert and oriented.   Nose: No congestion/rhinnorhea. Mouth/Throat: Mucous membranes are moist.   Cardiovascular: Normal rate, regular rhythm.  Warm and well-perfused extremities  Musculoskeletal: Right upper medial leg, dialysis catheter noted, dried  blood surrounding it, no active bleeding.  No evidence of infection Neurologic:  Normal speech and language. No gross focal neurologic deficits are appreciated.   Skin:  Skin is warm, dry and intact.    ____________________________________________   LABS (all labs ordered are listed, but only abnormal results are displayed)  Labs Reviewed - No data to  display ____________________________________________  EKG   ____________________________________________  RADIOLOGY   ____________________________________________   PROCEDURES  Procedure(s) performed: No  Procedures   Critical Care performed: No ____________________________________________   INITIAL IMPRESSION / ASSESSMENT AND PLAN / ED COURSE  Pertinent labs & imaging results that were available during my care of the patient were reviewed by me and considered in my medical decision making (see chart for details).  Bleeding controlled, dressing changed by me without further bleeding.  Appropriate for outpatient follow-up, arrange for outpatient dialysis today   ____________________________________________   FINAL CLINICAL IMPRESSION(S) / ED DIAGNOSES  Final diagnoses:  Bleeding due to dialysis catheter placement, initial encounter [facility])      NEW MEDICATIONS STARTED DURING THIS VISIT:  Discharge Medication List as of 04/17/2020  9:16 AM       Note:  This document was prepared using Dragon voice recognition software and may include unintentional dictation errors.   Lavonia Drafts, MD 04/17/20 [phone]

## 2020-04-17 NOTE — ED Notes (Signed)
Dr Corky Downs in with pt   Dressing changed  Tolerated well

## 2020-04-24 DIAGNOSIS — Z992 Dependence on renal dialysis: Secondary | ICD-10-CM | POA: Diagnosis not present

## 2020-04-24 DIAGNOSIS — N186 End stage renal disease: Secondary | ICD-10-CM | POA: Diagnosis not present

## 2020-04-26 DIAGNOSIS — N186 End stage renal disease: Secondary | ICD-10-CM | POA: Diagnosis not present

## 2020-04-26 DIAGNOSIS — Z992 Dependence on renal dialysis: Secondary | ICD-10-CM | POA: Diagnosis not present

## 2020-05-01 DIAGNOSIS — Z992 Dependence on renal dialysis: Secondary | ICD-10-CM | POA: Diagnosis not present

## 2020-05-01 DIAGNOSIS — N186 End stage renal disease: Secondary | ICD-10-CM | POA: Diagnosis not present

## 2020-05-02 ENCOUNTER — Emergency Department: Payer: Medicare HMO

## 2020-05-02 ENCOUNTER — Other Ambulatory Visit: Payer: Self-pay

## 2020-05-02 ENCOUNTER — Inpatient Hospital Stay
Admission: EM | Admit: 2020-05-02 | Discharge: 2020-05-06 | DRG: 314 | Disposition: A | Discharge status: Home / Self Care | Payer: Medicare HMO | Attending: Internal Medicine | Admitting: Internal Medicine

## 2020-05-02 DIAGNOSIS — N2581 Secondary hyperparathyroidism of renal origin: Secondary | ICD-10-CM | POA: Diagnosis not present

## 2020-05-02 DIAGNOSIS — Z89412 Acquired absence of left great toe: Secondary | ICD-10-CM | POA: Diagnosis not present

## 2020-05-02 DIAGNOSIS — T8243XD Leakage of vascular dialysis catheter, subsequent encounter: Secondary | ICD-10-CM | POA: Diagnosis not present

## 2020-05-02 DIAGNOSIS — I959 Hypotension, unspecified: Secondary | ICD-10-CM | POA: Diagnosis present

## 2020-05-02 DIAGNOSIS — E875 Hyperkalemia: Secondary | ICD-10-CM | POA: Diagnosis not present

## 2020-05-02 DIAGNOSIS — T829XXD Unspecified complication of cardiac and vascular prosthetic device, implant and graft, subsequent encounter: Secondary | ICD-10-CM | POA: Diagnosis not present

## 2020-05-02 DIAGNOSIS — I5032 Chronic diastolic (congestive) heart failure: Secondary | ICD-10-CM | POA: Diagnosis not present

## 2020-05-02 DIAGNOSIS — E785 Hyperlipidemia, unspecified: Secondary | ICD-10-CM | POA: Diagnosis not present

## 2020-05-02 DIAGNOSIS — E861 Hypovolemia: Secondary | ICD-10-CM | POA: Diagnosis not present

## 2020-05-02 DIAGNOSIS — E1140 Type 2 diabetes mellitus with diabetic neuropathy, unspecified: Secondary | ICD-10-CM | POA: Diagnosis not present

## 2020-05-02 DIAGNOSIS — I132 Hypertensive heart and chronic kidney disease with heart failure and with stage 5 chronic kidney disease, or end stage renal disease: Secondary | ICD-10-CM | POA: Diagnosis present

## 2020-05-02 DIAGNOSIS — E1143 Type 2 diabetes mellitus with diabetic autonomic (poly)neuropathy: Secondary | ICD-10-CM | POA: Diagnosis present

## 2020-05-02 DIAGNOSIS — Z833 Family history of diabetes mellitus: Secondary | ICD-10-CM

## 2020-05-02 DIAGNOSIS — I252 Old myocardial infarction: Secondary | ICD-10-CM | POA: Diagnosis not present

## 2020-05-02 DIAGNOSIS — E1122 Type 2 diabetes mellitus with diabetic chronic kidney disease: Secondary | ICD-10-CM | POA: Diagnosis not present

## 2020-05-02 DIAGNOSIS — M899 Disorder of bone, unspecified: Secondary | ICD-10-CM | POA: Diagnosis present

## 2020-05-02 DIAGNOSIS — E871 Hypo-osmolality and hyponatremia: Secondary | ICD-10-CM | POA: Diagnosis present

## 2020-05-02 DIAGNOSIS — Z992 Dependence on renal dialysis: Secondary | ICD-10-CM | POA: Diagnosis not present

## 2020-05-02 DIAGNOSIS — Z20822 Contact with and (suspected) exposure to covid-19: Secondary | ICD-10-CM | POA: Diagnosis present

## 2020-05-02 DIAGNOSIS — E1151 Type 2 diabetes mellitus with diabetic peripheral angiopathy without gangrene: Secondary | ICD-10-CM | POA: Diagnosis present

## 2020-05-02 DIAGNOSIS — E1121 Type 2 diabetes mellitus with diabetic nephropathy: Secondary | ICD-10-CM | POA: Diagnosis not present

## 2020-05-02 DIAGNOSIS — I9589 Other hypotension: Secondary | ICD-10-CM

## 2020-05-02 DIAGNOSIS — J449 Chronic obstructive pulmonary disease, unspecified: Secondary | ICD-10-CM | POA: Diagnosis present

## 2020-05-02 DIAGNOSIS — Z7902 Long term (current) use of antithrombotics/antiplatelets: Secondary | ICD-10-CM | POA: Diagnosis not present

## 2020-05-02 DIAGNOSIS — E878 Other disorders of electrolyte and fluid balance, not elsewhere classified: Secondary | ICD-10-CM | POA: Diagnosis present

## 2020-05-02 DIAGNOSIS — Z841 Family history of disorders of kidney and ureter: Secondary | ICD-10-CM

## 2020-05-02 DIAGNOSIS — D631 Anemia in chronic kidney disease: Secondary | ICD-10-CM | POA: Diagnosis not present

## 2020-05-02 DIAGNOSIS — Z743 Need for continuous supervision: Secondary | ICD-10-CM | POA: Diagnosis not present

## 2020-05-02 DIAGNOSIS — R0902 Hypoxemia: Secondary | ICD-10-CM | POA: Diagnosis not present

## 2020-05-02 DIAGNOSIS — K3184 Gastroparesis: Secondary | ICD-10-CM | POA: Diagnosis present

## 2020-05-02 DIAGNOSIS — L03115 Cellulitis of right lower limb: Secondary | ICD-10-CM | POA: Diagnosis not present

## 2020-05-02 DIAGNOSIS — I10 Essential (primary) hypertension: Secondary | ICD-10-CM | POA: Diagnosis not present

## 2020-05-02 DIAGNOSIS — I120 Hypertensive chronic kidney disease with stage 5 chronic kidney disease or end stage renal disease: Secondary | ICD-10-CM | POA: Diagnosis not present

## 2020-05-02 DIAGNOSIS — L03119 Cellulitis of unspecified part of limb: Secondary | ICD-10-CM | POA: Diagnosis present

## 2020-05-02 DIAGNOSIS — T82838A Hemorrhage of vascular prosthetic devices, implants and grafts, initial encounter: Principal | ICD-10-CM | POA: Diagnosis present

## 2020-05-02 DIAGNOSIS — Z794 Long term (current) use of insulin: Secondary | ICD-10-CM | POA: Diagnosis not present

## 2020-05-02 DIAGNOSIS — N186 End stage renal disease: Secondary | ICD-10-CM

## 2020-05-02 DIAGNOSIS — Z79899 Other long term (current) drug therapy: Secondary | ICD-10-CM

## 2020-05-02 DIAGNOSIS — R58 Hemorrhage, not elsewhere classified: Secondary | ICD-10-CM | POA: Diagnosis not present

## 2020-05-02 DIAGNOSIS — Z955 Presence of coronary angioplasty implant and graft: Secondary | ICD-10-CM | POA: Diagnosis not present

## 2020-05-02 DIAGNOSIS — Z85528 Personal history of other malignant neoplasm of kidney: Secondary | ICD-10-CM

## 2020-05-02 DIAGNOSIS — Z7982 Long term (current) use of aspirin: Secondary | ICD-10-CM

## 2020-05-02 DIAGNOSIS — R519 Headache, unspecified: Secondary | ICD-10-CM | POA: Diagnosis not present

## 2020-05-02 DIAGNOSIS — M79651 Pain in right thigh: Secondary | ICD-10-CM | POA: Diagnosis not present

## 2020-05-02 DIAGNOSIS — K219 Gastro-esophageal reflux disease without esophagitis: Secondary | ICD-10-CM | POA: Diagnosis not present

## 2020-05-02 DIAGNOSIS — E118 Type 2 diabetes mellitus with unspecified complications: Secondary | ICD-10-CM | POA: Diagnosis not present

## 2020-05-02 DIAGNOSIS — R531 Weakness: Secondary | ICD-10-CM | POA: Diagnosis not present

## 2020-05-02 LAB — CBC WITH DIFFERENTIAL/PLATELET
Abs Immature Granulocytes: 0.03 10*3/uL (ref 0.00–0.07)
Basophils Absolute: 0 10*3/uL (ref 0.0–0.1)
Basophils Relative: 0 %
Eosinophils Absolute: 0.8 10*3/uL — ABNORMAL HIGH (ref 0.0–0.5)
Eosinophils Relative: 8 %
HCT: 27.1 % — ABNORMAL LOW (ref 36.0–46.0)
Hemoglobin: 8.5 g/dL — ABNORMAL LOW (ref 12.0–15.0)
Immature Granulocytes: 0 %
Lymphocytes Relative: 19 %
Lymphs Abs: 1.8 10*3/uL (ref 0.7–4.0)
MCH: 29.4 pg (ref 26.0–34.0)
MCHC: 31.4 g/dL (ref 30.0–36.0)
MCV: 93.8 fL (ref 80.0–100.0)
Monocytes Absolute: 0.8 10*3/uL (ref 0.1–1.0)
Monocytes Relative: 8 %
Neutro Abs: 6.2 10*3/uL (ref 1.7–7.7)
Neutrophils Relative %: 65 %
Platelets: 101 10*3/uL — ABNORMAL LOW (ref 150–400)
RBC: 2.89 MIL/uL — ABNORMAL LOW (ref 3.87–5.11)
RDW: 16 % — ABNORMAL HIGH (ref 11.5–15.5)
WBC: 9.6 10*3/uL (ref 4.0–10.5)
nRBC: 0 % (ref 0.0–0.2)

## 2020-05-02 LAB — COMPREHENSIVE METABOLIC PANEL
ALT: 9 U/L (ref 0–44)
AST: 16 U/L (ref 15–41)
Albumin: 3.8 g/dL (ref 3.5–5.0)
Alkaline Phosphatase: 80 U/L (ref 38–126)
Anion gap: 14 (ref 5–15)
BUN: 33 mg/dL — ABNORMAL HIGH (ref 6–20)
CO2: 23 mmol/L (ref 22–32)
Calcium: 8.1 mg/dL — ABNORMAL LOW (ref 8.9–10.3)
Chloride: 97 mmol/L — ABNORMAL LOW (ref 98–111)
Creatinine, Ser: 6.91 mg/dL — ABNORMAL HIGH (ref 0.44–1.00)
GFR calc Af Amer: 7 mL/min — ABNORMAL LOW (ref >60)
GFR calc non Af Amer: 6 mL/min — ABNORMAL LOW (ref >60)
Glucose, Bld: 115 mg/dL — ABNORMAL HIGH (ref 70–99)
Potassium: 5.6 mmol/L — ABNORMAL HIGH (ref 3.5–5.1)
Sodium: 134 mmol/L — ABNORMAL LOW (ref 135–145)
Total Bilirubin: 1 mg/dL (ref 0.3–1.2)
Total Protein: 7.1 g/dL (ref 6.5–8.1)

## 2020-05-02 LAB — LACTIC ACID, PLASMA
Lactic Acid, Venous: 0.8 mmol/L (ref 0.5–1.9)
Lactic Acid, Venous: 0.5 mmol/L (ref 0.5–1.9)

## 2020-05-02 LAB — CBC
HCT: 24.6 % — ABNORMAL LOW (ref 36.0–46.0)
Hemoglobin: 7.8 g/dL — ABNORMAL LOW (ref 12.0–15.0)
MCH: 29.7 pg (ref 26.0–34.0)
MCHC: 31.7 g/dL (ref 30.0–36.0)
MCV: 93.5 fL (ref 80.0–100.0)
Platelets: 96 10*3/uL — ABNORMAL LOW (ref 150–400)
RBC: 2.63 MIL/uL — ABNORMAL LOW (ref 3.87–5.11)
RDW: 16 % — ABNORMAL HIGH (ref 11.5–15.5)
WBC: 8.5 10*3/uL (ref 4.0–10.5)
nRBC: 0 % (ref 0.0–0.2)

## 2020-05-02 LAB — CREATININE, SERUM
Creatinine, Ser: 7.18 mg/dL — ABNORMAL HIGH (ref 0.44–1.00)
GFR calc Af Amer: 7 mL/min — ABNORMAL LOW (ref >60)
GFR calc non Af Amer: 6 mL/min — ABNORMAL LOW (ref >60)

## 2020-05-02 LAB — SARS CORONAVIRUS 2 BY RT PCR (HOSPITAL ORDER, PERFORMED IN ~~LOC~~ HOSPITAL LAB): SARS Coronavirus 2: NEGATIVE

## 2020-05-02 MED ORDER — FENTANYL CITRATE (PF) 100 MCG/2ML IJ SOLN
50.0000 ug | Freq: Once | INTRAMUSCULAR | Status: AC
  Administered 2020-05-02: 50 ug via INTRAVENOUS
  Filled 2020-05-02: qty 2

## 2020-05-02 MED ORDER — OXYCODONE-ACETAMINOPHEN 5-325 MG PO TABS
1.0000 | ORAL_TABLET | Freq: Four times a day (QID) | ORAL | Status: DC | PRN
  Administered 2020-05-02 – 2020-05-03 (×2): 2 via ORAL
  Filled 2020-05-02: qty 2
  Filled 2020-05-02: qty 1
  Filled 2020-05-02: qty 2

## 2020-05-02 MED ORDER — HEPARIN SODIUM (PORCINE) 5000 UNIT/ML IJ SOLN: 5000.0000 [IU] | Freq: Three times a day (TID) | INTRAMUSCULAR | Status: DC

## 2020-05-02 MED ORDER — ALBUTEROL SULFATE (2.5 MG/3ML) 0.083% IN NEBU: 2.5000 mg | INHALATION_SOLUTION | RESPIRATORY_TRACT | Status: DC | PRN

## 2020-05-02 MED ORDER — PANTOPRAZOLE SODIUM 40 MG PO TBEC
40.0000 mg | DELAYED_RELEASE_TABLET | Freq: Every day | ORAL | Status: DC
  Administered 2020-05-03 – 2020-05-06 (×4): 40 mg via ORAL
  Filled 2020-05-02 (×4): qty 1

## 2020-05-02 MED ORDER — VITAMIN B-12 1000 MCG PO TABS
1000.0000 ug | ORAL_TABLET | Freq: Every day | ORAL | Status: DC
  Administered 2020-05-03 – 2020-05-06 (×4): 1000 ug via ORAL
  Filled 2020-05-02 (×4): qty 1

## 2020-05-02 MED ORDER — GABAPENTIN 100 MG PO CAPS
100.0000 mg | ORAL_CAPSULE | Freq: Every day | ORAL | Status: DC
  Administered 2020-05-03 – 2020-05-06 (×4): 100 mg via ORAL
  Filled 2020-05-02 (×5): qty 1

## 2020-05-02 MED ORDER — SODIUM CHLORIDE 0.9 % IV SOLN
1.0000 g | INTRAVENOUS | Status: DC
  Filled 2020-05-02: qty 10

## 2020-05-02 MED ORDER — FERROUS SULFATE 325 (65 FE) MG PO TABS
325.0000 mg | ORAL_TABLET | Freq: Every day | ORAL | Status: DC
  Administered 2020-05-03 – 2020-05-06 (×4): 325 mg via ORAL
  Filled 2020-05-02 (×4): qty 1

## 2020-05-02 MED ORDER — ALBUTEROL SULFATE HFA 108 (90 BASE) MCG/ACT IN AERS: 2.0000 | INHALATION_SPRAY | RESPIRATORY_TRACT | Status: DC | PRN

## 2020-05-02 MED ORDER — SODIUM CHLORIDE 0.9 % IV SOLN
1.0000 g | Freq: Once | INTRAVENOUS | Status: AC
  Administered 2020-05-02: 1 g via INTRAVENOUS
  Filled 2020-05-02: qty 10

## 2020-05-02 MED ORDER — SODIUM ZIRCONIUM CYCLOSILICATE 5 G PO PACK
5.0000 g | PACK | Freq: Once | ORAL | Status: AC
  Administered 2020-05-03: 5 g via ORAL
  Filled 2020-05-02: qty 1

## 2020-05-02 MED ORDER — SODIUM CHLORIDE 0.9 % IV SOLN: INTRAVENOUS | Status: DC

## 2020-05-02 MED ORDER — ASCORBIC ACID PO CRYS: CRYSTALS | Freq: Every day | ORAL | Status: DC

## 2020-05-02 MED ORDER — NITROGLYCERIN 0.4 MG SL SUBL: 0.4000 mg | SUBLINGUAL_TABLET | SUBLINGUAL | Status: DC | PRN

## 2020-05-02 MED ORDER — VANCOMYCIN HCL IN DEXTROSE 1-5 GM/200ML-% IV SOLN
1000.0000 mg | Freq: Once | INTRAVENOUS | Status: AC
  Administered 2020-05-02: 1000 mg via INTRAVENOUS
  Filled 2020-05-02: qty 200

## 2020-05-02 MED ORDER — ASCORBIC ACID 500 MG PO TABS
500.0000 mg | ORAL_TABLET | Freq: Every day | ORAL | Status: DC
  Administered 2020-05-03 – 2020-05-06 (×4): 500 mg via ORAL
  Filled 2020-05-02 (×4): qty 1

## 2020-05-02 MED ORDER — PANCRELIPASE (LIP-PROT-AMYL) 12000-38000 UNITS PO CPEP
36000.0000 [IU] | ORAL_CAPSULE | Freq: Three times a day (TID) | ORAL | Status: DC
  Administered 2020-05-03 – 2020-05-06 (×8): 36000 [IU] via ORAL
  Filled 2020-05-02 (×12): qty 3

## 2020-05-02 MED ORDER — SODIUM CHLORIDE 0.9 % IV BOLUS
250.0000 mL | Freq: Once | INTRAVENOUS | Status: AC
  Administered 2020-05-02: 250 mL via INTRAVENOUS

## 2020-05-02 MED ORDER — HYDROXYZINE HCL 25 MG PO TABS
25.0000 mg | ORAL_TABLET | ORAL | Status: DC
  Administered 2020-05-05: 25 mg via ORAL
  Filled 2020-05-02 (×2): qty 1

## 2020-05-02 MED ORDER — MAGNESIUM HYDROXIDE 400 MG/5ML PO SUSP: 30.0000 mL | Freq: Every day | ORAL | Status: DC | PRN

## 2020-05-02 MED ORDER — INSULIN DETEMIR 100 UNIT/ML ~~LOC~~ SOLN
50.0000 [IU] | Freq: Every day | SUBCUTANEOUS | Status: DC
  Filled 2020-05-02: qty 0.5

## 2020-05-02 MED ORDER — ATORVASTATIN CALCIUM 20 MG PO TABS
20.0000 mg | ORAL_TABLET | Freq: Every evening | ORAL | Status: DC
  Administered 2020-05-02 – 2020-05-05 (×4): 20 mg via ORAL
  Filled 2020-05-02 (×4): qty 1

## 2020-05-02 MED ORDER — MORPHINE SULFATE (PF) 2 MG/ML IV SOLN
2.0000 mg | INTRAVENOUS | Status: DC | PRN
  Administered 2020-05-03: 2 mg via INTRAVENOUS
  Filled 2020-05-02: qty 1

## 2020-05-02 MED ORDER — ACETAMINOPHEN 325 MG PO TABS: 650.0000 mg | ORAL_TABLET | Freq: Four times a day (QID) | ORAL | Status: DC | PRN

## 2020-05-02 MED ORDER — DIPHENHYDRAMINE HCL 25 MG PO CAPS
25.0000 mg | ORAL_CAPSULE | Freq: Four times a day (QID) | ORAL | Status: DC | PRN
  Administered 2020-05-04 – 2020-05-05 (×4): 25 mg via ORAL
  Filled 2020-05-02 (×4): qty 1

## 2020-05-02 MED ORDER — MECLIZINE HCL 12.5 MG PO TABS
12.5000 mg | ORAL_TABLET | Freq: Four times a day (QID) | ORAL | Status: DC
  Administered 2020-05-02 – 2020-05-06 (×12): 12.5 mg via ORAL
  Filled 2020-05-02 (×17): qty 1

## 2020-05-02 MED ORDER — SEVELAMER CARBONATE 800 MG PO TABS
2400.0000 mg | ORAL_TABLET | Freq: Three times a day (TID) | ORAL | Status: DC
  Administered 2020-05-03 – 2020-05-06 (×8): 2400 mg via ORAL
  Filled 2020-05-02 (×12): qty 3

## 2020-05-02 MED ORDER — RENA-VITE PO TABS
1.0000 | ORAL_TABLET | Freq: Every day | ORAL | Status: DC
  Administered 2020-05-03 – 2020-05-06 (×4): 1 via ORAL
  Filled 2020-05-02 (×5): qty 1

## 2020-05-02 MED ORDER — IRON 325 (65 FE) MG PO TABS: 325.0000 mg | ORAL_TABLET | Freq: Every day | ORAL | Status: DC

## 2020-05-02 MED ORDER — PROMETHAZINE HCL 25 MG/ML IJ SOLN
12.5000 mg | Freq: Once | INTRAMUSCULAR | Status: AC
  Administered 2020-05-02: 12.5 mg via INTRAVENOUS
  Filled 2020-05-02: qty 1

## 2020-05-02 MED ORDER — TRAZODONE HCL 50 MG PO TABS: 25.0000 mg | ORAL_TABLET | Freq: Every evening | ORAL | Status: DC | PRN

## 2020-05-02 MED ORDER — CALCIUM ACETATE (PHOS BINDER) 667 MG PO CAPS
2001.0000 mg | ORAL_CAPSULE | Freq: Three times a day (TID) | ORAL | Status: DC
  Filled 2020-05-02 (×3): qty 3

## 2020-05-02 MED ORDER — ACETAMINOPHEN 650 MG RE SUPP: 650.0000 mg | Freq: Four times a day (QID) | RECTAL | Status: DC | PRN

## 2020-05-02 MED ORDER — OMEGA-3-ACID ETHYL ESTERS 1 G PO CAPS
1.0000 g | ORAL_CAPSULE | Freq: Two times a day (BID) | ORAL | Status: DC
  Administered 2020-05-03 – 2020-05-06 (×7): 1 g via ORAL
  Filled 2020-05-02 (×7): qty 1

## 2020-05-02 MED ORDER — ASPIRIN EC 81 MG PO TBEC
81.0000 mg | DELAYED_RELEASE_TABLET | Freq: Every day | ORAL | Status: DC
  Administered 2020-05-03 – 2020-05-06 (×4): 81 mg via ORAL
  Filled 2020-05-02 (×4): qty 1

## 2020-05-02 MED ORDER — MUPIROCIN 2 % EX OINT
1.0000 "application " | TOPICAL_OINTMENT | Freq: Every day | CUTANEOUS | Status: DC | PRN
  Filled 2020-05-02: qty 22

## 2020-05-02 MED ORDER — ZINC SULFATE 220 (50 ZN) MG PO CAPS
220.0000 mg | ORAL_CAPSULE | Freq: Every day | ORAL | Status: DC
  Administered 2020-05-03 – 2020-05-06 (×4): 220 mg via ORAL
  Filled 2020-05-02 (×4): qty 1

## 2020-05-02 MED ORDER — TURMERIC 500 MG PO CAPS: 250.0000 mg | ORAL_CAPSULE | Freq: Every day | ORAL | Status: DC

## 2020-05-02 NOTE — ED Notes (Signed)
Pt is Tue/Thu/Sat dialysis

## 2020-05-02 NOTE — ED Notes (Signed)
MD updated on PT BP, will continue to monitor

## 2020-05-02 NOTE — ED Notes (Signed)
MD notified of pt BP. Manual BP 90/52 right wrist  250 NS bolus given, EKG performed

## 2020-05-02 NOTE — H&P (Addendum)
[facility] [place] NAME: [name]    MR#:  [id number]  DATE OF BIRTH:  [birth year]  DATE OF ADMISSION:  05/02/2020  PRIMARY CARE PHYSICIAN: Center, Ignacio   REQUESTING/REFERRING PHYSICIAN: Conni Slipper, MD  CHIEF COMPLAINT:   Chief Complaint  Patient presents with  . Vascular Access Problem    HISTORY OF PRESENT ILLNESS:  [name]  is a 59 y.o. African-American female with a known history of end-stage renal disease on hemodialysis, currently through her right thigh catheter, coronary artery disease, status post PCI and stent, chronic diastolic CHF, COPD and type 2 diabetes mellitus, who presented to the emergency room with acute onset of right thigh pain with associated swelling, mild redness, warmth and tenderness that started at 3 AM today.  She has been feeling cold but denies any fever.  No nausea or vomiting or abdominal pain.  No chest pain or dyspnea or cough or wheezing.  She had her hemodialysis today.  She was having a headache without paresthesias or focal muscle weakness.  Upon presentation to the emergency room, vital signs were within normal with a blood pressure 122/82 and later 74/50 with improvement with hydration to 112/60.  Labs revealed mild hyperkalemia 5.6 and mild hyponatremia hypochloremia, BUN of 33 and creatinine 6.91.  CBC showed hemoglobin of 7.8 and hematocrit of 24.6 compared to 8.5 and 27.1.  COVID-19 PCR is currently pending.  Noncontrasted head CT scan revealed no acute intracranial abnormalities.  The patient was given IV Rocephin vancomycin and 250 mL IV normal saline, 12.5 mg of IV Phenergan and 50 mcg of IV fentanyl.  Given stabilization of her blood pressure, she will be admitted to a medically monitored bed for further evaluation and management.  PAST MEDICAL HISTORY:   Past Medical History:  Diagnosis Date  . Anemia   . Anginal pain ([place])   . Anxiety   . Arthritis   . Asthma   . Broken  wrist   . Bronchitis   . chronic diastolic CHF 0/86/7619  . Chronic kidney disease   . COPD (chronic obstructive pulmonary disease) ([place])   . Coronary artery disease    a. cath 2013: stenting to RCA (report not available); b. cath 2014: LM nl, pLAD 40%, mLAD nl, ost LCx 40%, mid LCx nl, pRCA 30% @ site of prior stent, mRCA 50%  . Depression   . Diabetes mellitus ([place])   . Diabetes mellitus without complication ([place])   . Diabetic neuropathy ([place])   . dialysis 2006  . Diverticulosis   . Dizziness   . Dyspnea   . Elevated lipids   . Environmental and seasonal allergies   . ESRD (end stage renal disease) on dialysis ([place])    M-W-F  . Gastroparesis   . GERD (gastroesophageal reflux disease)   . Headache   . History of anemia due to chronic kidney disease   . History of hiatal hernia   . HOH (hard of hearing)   . Hx of pancreatitis 2015  . Hypertension   . Lower extremity edema   . Mitral regurgitation    a. echo 10/2013: EF 62%, noWMA, mildly dilated LA, mild to mod MR/TR, GR1DD  . Myocardial infarction ([place])   . Orthopnea   . Parathyroid abnormality ([place])   . Peripheral arterial disease ([place])   . Pneumonia   . Renal cancer ([place])   . Renal insufficiency    Pt is on dialysis  on M,W + F.  . Wheezing     PAST SURGICAL HISTORY:   Past Surgical History:  Procedure Laterality Date  . A/V SHUNTOGRAM Left 01/20/2018   Procedure: A/V SHUNTOGRAM;  Surgeon: Algernon Huxley, MD;  Location: [facility];  Service: Cardiovascular;  Laterality: Left;  . ABDOMINAL HYSTERECTOMY  1992  . AMPUTATION TOE Left 10/02/2017   Procedure: AMPUTATION TOE-LEFT GREAT TOE;  Surgeon: Albertine Patricia, DPM;  Location: [facility];  Service: Podiatry;  Laterality: Left;  . APPENDECTOMY    . APPLICATION OF WOUND VAC N/A 11/25/2019   Procedure: APPLICATION OF WOUND VAC;  Surgeon: Katha Cabal, MD;  Location: [facility];  Service: Vascular;  Laterality: N/A;  . ARTERY BIOPSY Right 10/11/2018   Procedure:  BIOPSY TEMPORAL ARTERY;  Surgeon: Vickie Epley, MD;  Location: [facility];  Service: General;  Laterality: Right;  . CARDIAC CATHETERIZATION Left 07/26/2015   Procedure: Left Heart Cath and Coronary Angiography;  Surgeon: Dionisio David, MD;  Location: [facility];  Service: Cardiovascular;  Laterality: Left;  . CATARACT EXTRACTION W/ INTRAOCULAR LENS IMPLANT Right   . CATARACT EXTRACTION W/PHACO Left 03/10/2017   Procedure: CATARACT EXTRACTION PHACO AND INTRAOCULAR LENS PLACEMENT (IOC);  Surgeon: Birder Robson, MD;  Location: [facility];  Service: Ophthalmology;  Laterality: Left;  [place] 00:51.9 AP% 14.2 CDE 7.39 fluid pack lot # [id number]  . CENTRAL LINE INSERTION Right 11/11/2019   Procedure: CENTRAL LINE INSERTION;  Surgeon: Katha Cabal, MD;  Location: [facility];  Service: Vascular;  Laterality: Right;  . CENTRAL LINE INSERTION  11/25/2019   Procedure: CENTRAL LINE INSERTION;  Surgeon: Katha Cabal, MD;  Location: [facility];  Service: Vascular;;  . CHOLECYSTECTOMY    . COLONOSCOPY WITH PROPOFOL N/A 08/12/2016   Procedure: COLONOSCOPY WITH PROPOFOL;  Surgeon: Lollie Sails, MD;  Location: [facility];  Service: Endoscopy;  Laterality: N/A;  . DIALYSIS FISTULA CREATION Left    upper arm  . dialysis grafts    . DIALYSIS/PERMA CATHETER INSERTION N/A 11/14/2019   Procedure: DIALYSIS/PERMA CATHETER INSERTION;  Surgeon: Algernon Huxley, MD;  Location: [place] [facility];  Service: Cardiovascular;  Laterality: N/A;  . DIALYSIS/PERMA CATHETER INSERTION N/A 02/03/2020   Procedure: DIALYSIS/PERMA CATHETER INSERTION;  Surgeon: Katha Cabal, MD;  Location: [place] [facility];  Service: Cardiovascular;  Laterality: N/A;  . ESOPHAGOGASTRODUODENOSCOPY N/A 03/08/2015   Procedure: ESOPHAGOGASTRODUODENOSCOPY (EGD);  Surgeon: Manya Silvas, MD;  [facility];  Service: Endoscopy;  Laterality: N/A;  . ESOPHAGOGASTRODUODENOSCOPY (EGD) WITH PROPOFOL N/A 03/18/2016    Procedure: ESOPHAGOGASTRODUODENOSCOPY (EGD) WITH PROPOFOL;  Surgeon: [name], MD;  Location: [facility];  Service: Endoscopy;  Laterality: N/A;  . EYE SURGERY Right 2018  . FECAL TRANSPLANT N/A 08/23/2015   Procedure: FECAL TRANSPLANT;  Surgeon: Manya Silvas, MD;  Location: [facility] ENDOSCOPY;  Service: Endoscopy;  Laterality: N/A;  . HAND SURGERY Bilateral   . HEMATOMA EVACUATION Left 11/25/2019   Procedure: EVACUATION HEMATOMA;  Surgeon: Katha Cabal, MD;  Location: [facility];  Service: Vascular;  Laterality: Left;  . I & D EXTREMITY Left 11/25/2019   Procedure: IRRIGATION AND DEBRIDEMENT EXTREMITY;  Surgeon: Katha Cabal, MD;  Location: [facility];  Service: Vascular;  Laterality: Left;  . IR FLUORO GUIDE CV LINE RIGHT  04/06/2020  . IR RADIOLOGIST EVAL & MGMT  07/28/2019  . IR RADIOLOGIST EVAL & MGMT  08/11/2019  . LIGATION OF ARTERIOVENOUS  FISTULA Left 11/11/2019  .  LIGATION OF ARTERIOVENOUS  FISTULA Left 11/11/2019   Procedure: LIGATION OF ARTERIOVENOUS  FISTULA;  Surgeon: Katha Cabal, MD;  Location: [facility];  Service: Vascular;  Laterality: Left;  . PERIPHERAL VASCULAR CATHETERIZATION N/A 12/20/2015   Procedure: Thrombectomy of dialysis access versus permcath placement;  Surgeon: Algernon Huxley, MD;  Location: [place] [facility];  Service: Cardiovascular;  Laterality: N/A;  . PERIPHERAL VASCULAR CATHETERIZATION N/A 12/20/2015   Procedure: A/V Shunt Intervention;  Surgeon: Algernon Huxley, MD;  Location: [place];  Service: Cardiovascular;  Laterality: N/A;  . PERIPHERAL VASCULAR CATHETERIZATION N/A 12/20/2015   Procedure: A/V Shuntogram/Fistulagram;  Surgeon: Algernon Huxley, MD;  Location: [facility];  Service: Cardiovascular;  Laterality: N/A;  . PERIPHERAL VASCULAR CATHETERIZATION N/A 01/02/2016   Procedure: A/V Shuntogram/Fistulagram;  Surgeon: Algernon Huxley, MD;  Location: [facility] [facility];  Service: Cardiovascular;  Laterality: N/A;  . PERIPHERAL  VASCULAR CATHETERIZATION N/A 01/02/2016   Procedure: A/V Shunt Intervention;  Surgeon: Algernon Huxley, MD;  Location: [facility];  Service: Cardiovascular;  Laterality: N/A;  . UPPER EXTREMITY VENOGRAPHY Right 01/18/2020   Procedure: UPPER EXTREMITY VENOGRAPHY;  Surgeon: Katha Cabal, MD;  Location: [facility];  Service: Cardiovascular;  Laterality: Right;  Marland Kitchen VASCULAR ACCESS DEVICE INSERTION Right 04/13/2020   Procedure: INSERTION OF HERO VASCULAR ACCESS DEVICE (GRAFT);  Surgeon: Katha Cabal, MD;  Location: [facility];  Service: Vascular;  Laterality: Right;    SOCIAL HISTORY:   Social History   Tobacco Use  . Smoking status: Never Smoker  . Smokeless tobacco: Never Used  Substance Use Topics  . Alcohol use: Not Currently    Comment: glass wine week per pt    FAMILY HISTORY:   Family History  Problem Relation Age of Onset  . Kidney disease Mother   . Diabetes Mother   . Cancer Father   . Kidney disease Sister     DRUG ALLERGIES:   Allergies  Allergen Reactions  . Ace Inhibitors Swelling and Anaphylaxis  . Ativan [Lorazepam] Other (See Comments)    Reaction:Hallucinations and headaches  . Compazine [Prochlorperazine Edisylate] Anaphylaxis, Nausea And Vomiting and Other (See Comments)    Other reaction(s): dystonia from this vs. Reglan, 23 Jul - patient relates that she takes promethazine frequently with no problems  . Sumatriptan Succinate Other (See Comments)    Other reaction(s): delirium and hallucinations per [facility] records  . Zofran [Ondansetron] Nausea And Vomiting    Per pt. she is allergic to zofran or will experience adverse reaction like hallucination   . [place] Nausea And Vomiting  . Losartan Nausea Only  . Prochlorperazine Other (See Comments)    Reaction:  Unknown . Patient does not remember reaction but she does have vertigo and anxiety along with n and v at times. Could be used to treat any of these   . Reglan [Metoclopramide]  Other (See Comments)    Per patient her Dr. Evelina Bucy her off it   . Scopolamine Other (See Comments)    Dizziness, also has vertigo already  . Tape Rash    Plastic tape causes rash  . Tapentadol Rash    REVIEW OF SYSTEMS:   ROS As per history of present illness. All pertinent systems were reviewed above. Constitutional, HEENT, cardiovascular, respiratory, GI, GU, musculoskeletal, neuro, psychiatric, endocrine, integumentary and hematologic systems were reviewed and are otherwise negative/unremarkable except for positive findings mentioned above in the HPI.   MEDICATIONS  AT HOME:   Prior to Admission medications   Medication Sig Start Date End Date Taking? Authorizing Provider  atorvastatin (LIPITOR) 20 MG tablet Take 20 mg by mouth every evening.  10/24/19  Yes [provider]  calcium acetate (PHOSLO) 667 MG capsule TAKE 3 CAPSULES BY MOUTH 3 TIMES A DAY BEFORE MEALS AND 1 WITH SNACKS 04/10/20  Yes [provider]  carvedilol (COREG) 6.25 MG tablet Take 6.25 mg by mouth 2 (two) times daily. 10/24/19  Yes [provider]  gabapentin (NEURONTIN) 100 MG capsule Take 100 mg by mouth 2 (two) times daily.  10/29/19  Yes [provider]  HYDROcodone-acetaminophen (NORCO/VICODIN) 5-325 MG tablet Take 1 tablet by mouth 4 (four) times daily as needed for moderate pain.  10/20/19  Yes [provider]  hydrOXYzine (ATARAX/VISTARIL) 25 MG tablet Take 25 mg by mouth See admin instructions. Take on Tues, Thurs, and sat after dialysis Monday Wednesday Friday and Sunday daily in the morning   Yes [provider]  meclizine (ANTIVERT) 25 MG tablet Take 12.5 mg by mouth every 6 (six) hours.  11/01/19  Yes [provider]  oxyCODONE-acetaminophen (PERCOCET) 5-325 MG tablet Take 1-2 tablets by mouth every 6 (six) hours as needed for moderate pain or severe pain. 04/13/20 04/13/21 Yes Schnier, Dolores Lory, MD  pantoprazole (PROTONIX) 40 MG tablet Take 40 mg by  mouth daily. 08/28/19  Yes [provider]  albuterol (PROVENTIL) (2.5 MG/3ML) 0.083% nebulizer solution Take 2.5 mg by nebulization every 4 (four) hours as needed for wheezing or shortness of breath.    [provider]  albuterol (VENTOLIN HFA) 108 (90 Base) MCG/ACT inhaler Inhale 2 puffs into the lungs every 4 (four) hours as needed for wheezing or shortness of breath. 11/09/19   [provider]  amLODipine (NORVASC) 10 MG tablet Take 10 mg by mouth daily.  Patient not taking: Reported on 05/02/2020 10/24/19   [provider]  ammonium lactate (LAC-HYDRIN) 12 % lotion Apply 1 application topically in the morning and at bedtime. Patient not taking: Reported on 05/02/2020    [provider]  Ascorbic Acid (VITA-C PO) Take 1 tablet by mouth daily.    [provider]  aspirin EC 81 MG tablet Take 81 mg by mouth daily.    [provider]  Cholecalciferol (VITAMIN D3) 25 MCG (1000 UT) CAPS Take 1,000 Units by mouth daily.     [provider]  cinacalcet (SENSIPAR) 30 MG tablet Take 30 mg by mouth daily. Patient not taking: Reported on 05/02/2020    [provider]  citalopram (CELEXA) 20 MG tablet Take 20 mg by mouth daily. Patient not taking: Reported on 05/02/2020 10/29/19   [provider]  clopidogrel (PLAVIX) 75 MG tablet Take 75 mg by mouth at bedtime.  Patient not taking: Reported on 05/02/2020 07/01/19   [provider]  COMBIVENT RESPIMAT 20-100 MCG/ACT AERS respimat Inhale 2 puffs into the lungs every 6 (six) hours as needed for wheezing or shortness of breath. Shortness of breath Patient not taking: Reported on 05/02/2020 11/30/19   [provider]  CREON 12000 units CPEP capsule Take 12,000 Units by mouth See admin instructions. Take 36,000 with meals and 24,000 with snacks three times a day 10/21/19   [provider]  cyanocobalamin 1000 MCG tablet Take 1,000 mcg by mouth daily.      [provider]  diclofenac sodium (VOLTAREN) 1 % GEL Apply 1 application topically in the morning and  at bedtime.     [provider]  diphenhydrAMINE (BENADRYL) 25 MG tablet Take 25 mg by mouth every 6 (six) hours as needed for itching.    [provider]  Ferrous Sulfate (IRON) 325 (65 Fe) MG TABS Take 325 mg by mouth daily.     [provider]  insulin aspart (NOVOLOG) 100 UNIT/ML injection Inject 4-15 Units into the skin in the morning, at noon, in the evening, and at bedtime. Sliding scale    [provider]  insulin detemir (LEVEMIR) 100 UNIT/ML injection Inject 67 Units into the skin at bedtime.    [provider]  multivitamin (RENA-VIT) TABS tablet Take 1 tablet by mouth daily.     [provider]  mupirocin ointment (BACTROBAN) 2 % Apply 1 application topically daily as needed (leg rash).  11/09/19   [provider]  nitroGLYCERIN (NITROSTAT) 0.4 MG SL tablet Place 0.4 mg under the tongue every 5 (five) minutes as needed for chest pain.     [provider]  Omega-3 300 MG CAPS Take 300 mg by mouth 2 (two) times daily. Every other day opposite omega XL    [provider]  Probiotic Product (PROBIOTIC PO) Take 1 capsule by mouth daily.    [provider]  promethazine (PHENERGAN) 12.5 MG tablet Take 1 tablet (12.5 mg total) by mouth every 8 (eight) hours as needed for nausea. 11/17/19   Lorella Nimrod, MD  sevelamer carbonate (RENVELA) 800 MG tablet Take 800-2,400 mg by mouth See admin instructions. Take 2400 mg by mouth 3 times daily with meals and take [phone] mg by mouth with snacks    [provider]  Turmeric (QC TUMERIC COMPLEX PO) Take 250 mg by mouth daily.     [provider]  Zinc 50 MG CAPS Take 50 mg by mouth daily.     [provider]      VITAL SIGNS:  Blood pressure (!) 74/50, pulse 68, temperature 98.3 F (36.8 C), temperature source Oral, resp. rate  18, height 5\' 5"  (1.651 m), weight 82 kg, SpO2 98 %.  PHYSICAL EXAMINATION:  Physical Exam  GENERAL:  59 y.o.-year-old African-American female patient lying in the bed with no acute distress.  EYES: Pupils equal, round, reactive to light and accommodation. No scleral icterus. Extraocular muscles intact.  HEENT: Head atraumatic, normocephalic. Oropharynx and nasopharynx clear.  NECK:  Supple, no jugular venous distention. No thyroid enlargement, no tenderness.  LUNGS: Normal breath sounds bilaterally, no wheezing, rales,rhonchi or crepitation. No use of accessory muscles of respiration.  CARDIOVASCULAR: Regular rate and rhythm, S1, S2 normal. No murmurs, rubs, or gallops.  ABDOMEN: Soft, nondistended, nontender. Bowel sounds present. No organomegaly or mass.  EXTREMITIES: No leg edema, cyanosis, or clubbing.  NEUROLOGIC: Cranial nerves II through XII are intact. Muscle strength 5/5 in all extremities. Sensation intact. Gait not checked.  PSYCHIATRIC: The patient is alert and oriented x 3.  Normal affect and good eye contact. SKIN: Right thigh swelling, erythema mild warmth with significant tenderness without fluctuation surrounding her hemodialysis catheter with associated dried blood.     LABORATORY PANEL:   CBC Recent Labs  Lab 05/02/20 1751  WBC 9.6  HGB 8.5*  HCT 27.1*  PLT 101*   ------------------------------------------------------------------------------------------------------------------  Chemistries  Recent Labs  Lab 05/02/20 1751  NA 134*  K 5.6*  CL 97*  CO2 23  GLUCOSE 115*  BUN 33*  CREATININE 6.91*  CALCIUM 8.1*  AST 16  ALT 9  ALKPHOS 80  BILITOT 1.0   ------------------------------------------------------------------------------------------------------------------  Cardiac Enzymes No results for input(s): TROPONINI in the last 168  hours. ------------------------------------------------------------------------------------------------------------------  RADIOLOGY:  CT Head Wo Contrast  Result Date: 05/02/2020 CLINICAL DATA:  Headache, classic migraine EXAM: CT HEAD WITHOUT CONTRAST TECHNIQUE: Contiguous axial images were obtained from the base of the skull through the vertex without intravenous contrast. COMPARISON:  10/04/2018 FINDINGS: Brain: No acute intracranial abnormality. Specifically, no hemorrhage, hydrocephalus, mass lesion, acute infarction, or significant intracranial injury. Vascular: No hyperdense vessel or unexpected calcification. Skull: No acute calvarial abnormality. Sinuses/Orbits: Visualized paranasal sinuses and mastoids clear. Orbital soft tissues unremarkable. Other: None IMPRESSION: No acute intracranial abnormality. Electronically Signed   By: Rolm Baptise M.D.   On: 05/02/2020 18:39      IMPRESSION AND PLAN:   1.  Right lower extremity/thigh cellulitis likely secondary to infected hemodialysis catheter. -The patient will be admitted to a medically monitored bed. -We will continue back therapy with IV Rocephin. -We will obtain a follow-up nephrology consult. -She may need her hemodialysis catheter removed. -Warm compresses will be utilized.  2.  End-stage renal disease on hemodialysis with current hyperkalemia. -Nephrology consult will be obtained in a.m. -Low, will be given for 1 dose. -We will continue PhosLo and Sensipar as well as vitamin D3.  3.  Brief hypotension with history of hypertension. -We will hold amlodipine and Coreg a.m. provide cautious hydration.  4.  Dyslipidemia. -Continue statin therapy.  5.  Type 2 diabetes mellitus with diabetic neuropathy.  -The patient will be placed on supplement coverage with NovoLog. -We will continue basal coverage.  6.  GERD. -Continues Protonix.  7.  DVT prophylaxis. -Subcutaneous heparin.  All the records are reviewed and case  discussed with ED provider. The plan of care was discussed in details with the patient (and family). I answered all questions. The patient agreed to proceed with the above mentioned plan. Further management will depend upon hospital course.   CODE STATUS: Full code  Status is: Inpatient  Remains inpatient appropriate because:Ongoing active pain requiring inpatient pain management, Ongoing diagnostic testing needed not appropriate for outpatient work up, Unsafe d/c plan, IV treatments appropriate due to intensity of illness or inability to take PO and Inpatient level of care appropriate due to severity of illness   Dispo: The patient is from: Home              Anticipated d/c is to: Home              Anticipated d/c date is: 2 days              Patient currently is not medically stable to d/c.   TOTAL TIME TAKING CARE OF THIS PATIENT: 55 minutes.    Christel Mormon M.D on 05/02/2020 at 8:58 PM  [facility]   From 7 PM-7 AM, contact night-coverage [email or web address]  CC: Primary care physician; [facility], [place]   Note: This dictation was prepared with Diplomatic Services operational officer dictation along with smaller phrase technology. Any transcriptional typo errors that result from this process are unintentional.

## 2020-05-02 NOTE — ED Triage Notes (Signed)
Pt comes EMS with bleeding dialysis catheter (right leg). Last dialysis was yesterday. Pt reports bleeding since early this morning and pain. Pt unable to walk on that leg. Pt also reports headache since this morning.

## 2020-05-02 NOTE — ED Provider Notes (Addendum)
[facility] Emergency Department Provider Note   ____________________________________________   First MD Initiated Contact with Patient 05/02/20 1746     (approximate)  I have reviewed the triage vital signs and the nursing notes.   HISTORY  Chief Complaint Vascular Access Problem    HPI [name] is a 59 y.o. female who comes in complaining of bleeding around her right leg dialysis catheter.  Last dialysis was yesterday.  Patient complains of a lot of pain in the area.  She also says she had an occipital headache this morning is now spread to the whole head.  Her leg is red swollen and tender.  It is hot as well.  [name] about 6inches surrounding the catheter.  Pain is moderately severe.  Headache came on gradually.  It is also fairly severe.         Past Medical History:  Diagnosis Date  . Anemia   . Anginal pain ([place])   . Anxiety   . Arthritis   . Asthma   . Broken wrist   . Bronchitis   . chronic diastolic CHF 8/41/3244  . Chronic kidney disease   . COPD (chronic obstructive pulmonary disease) ([place])   . Coronary artery disease    a. cath 2013: stenting to RCA (report not available); b. cath 2014: LM nl, pLAD 40%, mLAD nl, ost LCx 40%, mid LCx nl, pRCA 30% @ site of prior stent, mRCA 50%  . Depression   . Diabetes mellitus ([place])   . Diabetes mellitus without complication ([place])   . Diabetic neuropathy ([place])   . dialysis 2006  . Diverticulosis   . Dizziness   . Dyspnea   . Elevated lipids   . Environmental and seasonal allergies   . ESRD (end stage renal disease) on dialysis ([place])    M-W-F  . Gastroparesis   . GERD (gastroesophageal reflux disease)   . Headache   . History of anemia due to chronic kidney disease   . History of hiatal hernia   . HOH (hard of hearing)   . Hx of pancreatitis 2015  . Hypertension   . Lower extremity edema   . Mitral regurgitation    a. echo 10/2013: EF 62%, noWMA, mildly dilated LA, mild to mod  MR/TR, GR1DD  . Myocardial infarction ([place])   . Orthopnea   . Parathyroid abnormality ([place])   . Peripheral arterial disease ([place])   . Pneumonia   . Renal cancer ([place])   . Renal insufficiency    Pt is on dialysis on M,W + F.  . Wheezing     Patient Active Problem List   Diagnosis Date Noted  . ESRD (end stage renal disease) ([place]) 04/13/2020  . Hematoma of arm, left, subsequent encounter 11/24/2019  . Respiratory failure ([place])   . Shortness of breath   . Complication of arteriovenous dialysis fistula 11/13/2019  . PVD (peripheral vascular disease) ([place]) 11/13/2019  . Severe anemia 11/13/2019  . Pressure injury of skin 11/13/2019  . ESRD on hemodialysis ([place])   . Hemorrhagic shock ([place]) 11/11/2019  . Leg pain 10/02/2019  . CAP (community acquired pneumonia) 10/01/2019  . Atherosclerosis of native arteries of extremity with intermittent claudication ([place]) 05/01/2019  . Right-sided headache   . COPD (chronic obstructive pulmonary disease) ([place]) 10/06/2018  . Syncope 10/04/2018  . Symptomatic anemia 06/18/2018  . Gastroparesis due to DM ([place]) 01/18/2018  . Complication of vascular access [name] dialysis 12/04/2017  . Osteomyelitis ([place]) 09/30/2017  .  Carotid stenosis 06/18/2017  . Shortness of breath 05/04/2017  . Cellulitis of lower extremity 07/29/2016  . Chronic venous insufficiency 07/29/2016  . Lymphedema 07/29/2016  . TIA (transient ischemic attack) 04/21/2016  . Altered mental status 04/08/2016  . Hyperammonemia ([place]) 04/08/2016  . Elevated troponin 04/08/2016  . Depression 04/08/2016  . Depression, major, recurrent, severe with psychosis ([place]) 04/08/2016  . Blood in stool   . Intractable cyclical vomiting with nausea   . Reflux esophagitis   . Gastritis   . Generalized abdominal pain   . Uncontrollable vomiting   . Major depressive disorder, recurrent episode, moderate ([place]) 03/15/2016  . Adjustment disorder with mixed anxiety and depressed mood 03/15/2016  .  Malnutrition of moderate degree 12/01/2015  . Renal mass   . Dyspnea   . Acute renal failure ([place])   . Respiratory failure ([place])   . High temperature 11/14/2015  . Pulmonary edema   . Encounter [name] central line placement   . Encounter [name] orogastric (OG) tube placement   . Nausea 11/12/2015  . Hyperkalemia 10/03/2015  . Diarrhea, unspecified 07/22/2015  . Pneumonia 05/21/2015  . Hypoglycemia 04/24/2015  . Unresponsiveness 04/24/2015  . Bradycardia 04/24/2015  . Hypothermia 04/24/2015  . Acute respiratory failure ([place]) 04/24/2015  . Acute diastolic CHF (congestive heart failure) ([place]) 04/05/2015  . Diabetic gastroparesis ([place]) 04/05/2015  . Hypokalemia 04/05/2015  . Generalized weakness 04/05/2015  . Acute pulmonary edema ([place]) 04/03/2015  . Nausea and vomiting 04/03/2015  . Hypoglycemia associated with diabetes ([place]) 04/03/2015  . Anemia of chronic disease 04/03/2015  . Secondary hyperparathyroidism ([place]) 04/03/2015  . Pressure ulcer 04/02/2015  . Acute respiratory failure with hypoxia ([place]) 04/01/2015  . Adjustment disorder with anxiety 03/14/2015  . Somatic symptom disorder, mild 03/08/2015  . Coronary artery disease involving native coronary artery of native heart without angina pectoris   . Nausea & vomiting 03/06/2015  . Abdominal pain 03/06/2015  . DM (diabetes mellitus) ([place]) 03/06/2015  . HTN (hypertension) 03/06/2015  . Gastroparesis 02/24/2015  . Pleural effusion 02/19/2015  . HCAP (healthcare-associated pneumonia) 02/19/2015  . End-stage renal disease on hemodialysis ([place]) 02/19/2015    Past Surgical History:  Procedure Laterality Date  . A/V SHUNTOGRAM Left 01/20/2018   Procedure: A/V SHUNTOGRAM;  Surgeon: Algernon Huxley, MD;  Location: [place] CV LAB;  Service: Cardiovascular;  Laterality: Left;  . ABDOMINAL HYSTERECTOMY  1992  . AMPUTATION TOE Left 10/02/2017   Procedure: AMPUTATION TOE-LEFT GREAT TOE;  Surgeon: Albertine Patricia, DPM;  Location: [facility]  ORS;  Service: Podiatry;  Laterality: Left;  . APPENDECTOMY    . APPLICATION OF WOUND VAC N/A 11/25/2019   Procedure: APPLICATION OF WOUND VAC;  Surgeon: Katha Cabal, MD;  Location: [facility] ORS;  Service: Vascular;  Laterality: N/A;  . ARTERY BIOPSY Right 10/11/2018   Procedure: BIOPSY TEMPORAL ARTERY;  Surgeon: Vickie Epley, MD;  Location: [facility] ORS;  Service: General;  Laterality: Right;  . CARDIAC CATHETERIZATION Left 07/26/2015   Procedure: Left Heart Cath and Coronary Angiography;  Surgeon: Dionisio David, MD;  Location: [place] CV LAB;  Service: Cardiovascular;  Laterality: Left;  . CATARACT EXTRACTION W/ INTRAOCULAR LENS IMPLANT Right   . CATARACT EXTRACTION W/PHACO Left 03/10/2017   Procedure: CATARACT EXTRACTION PHACO AND INTRAOCULAR LENS PLACEMENT (IOC);  Surgeon: Birder Robson, MD;  Location: [facility] ORS;  Service: Ophthalmology;  Laterality: Left;  [place] 00:51.9 AP% 14.2 CDE 7.39 fluid pack lot # [id number]  . CENTRAL LINE INSERTION Right 11/11/2019  Procedure: CENTRAL LINE INSERTION;  Surgeon: Katha Cabal, MD;  Location: [facility] ORS;  Service: Vascular;  Laterality: Right;  . CENTRAL LINE INSERTION  11/25/2019   Procedure: CENTRAL LINE INSERTION;  Surgeon: Katha Cabal, MD;  Location: [facility] ORS;  Service: Vascular;;  . CHOLECYSTECTOMY    . COLONOSCOPY WITH PROPOFOL N/A 08/12/2016   Procedure: COLONOSCOPY WITH PROPOFOL;  Surgeon: Lollie Sails, MD;  Location[facility] ENDOSCOPY;  Service: Endoscopy;  Laterality: N/A;  . DIALYSIS FISTULA CREATION Left    upper arm  . dialysis grafts    . DIALYSIS/PERMA CATHETER INSERTION N/A 11/14/2019   Procedure: DIALYSIS/PERMA CATHETER INSERTION;  Surgeon: Algernon Huxley, MD;  Location: [place] CV LAB;  Service: Cardiovascular;  Laterality: N/A;  . DIALYSIS/PERMA CATHETER INSERTION N/A 02/03/2020   Procedure: DIALYSIS/PERMA CATHETER INSERTION;  Surgeon: Katha Cabal, MD;  Location: [place] CV LAB;  Service:  Cardiovascular;  Laterality: N/A;  . ESOPHAGOGASTRODUODENOSCOPY N/A 03/08/2015   Procedure: ESOPHAGOGASTRODUODENOSCOPY (EGD);  Surgeon: Manya Silvas, MD;  Location: [facility] ENDOSCOPY;  Service: Endoscopy;  Laterality: N/A;  . ESOPHAGOGASTRODUODENOSCOPY (EGD) WITH PROPOFOL N/A 03/18/2016   Procedure: ESOPHAGOGASTRODUODENOSCOPY (EGD) WITH PROPOFOL;  Surgeon: [name], MD;  Location: [facility] ENDOSCOPY;  Service: Endoscopy;  Laterality: N/A;  . EYE SURGERY Right 2018  . FECAL TRANSPLANT N/A 08/23/2015   Procedure: FECAL TRANSPLANT;  Surgeon: Manya Silvas, MD;  Location[facility];  Service: Endoscopy;  Laterality: N/A;  . HAND SURGERY Bilateral   . HEMATOMA EVACUATION Left 11/25/2019   Procedure: EVACUATION HEMATOMA;  Surgeon: Katha Cabal, MD;  Location: [facility] ORS;  Service: Vascular;  Laterality: Left;  . I & D EXTREMITY Left 11/25/2019   Procedure: IRRIGATION AND DEBRIDEMENT EXTREMITY;  Surgeon: Katha Cabal, MD;  Location: [facility] ORS;  Service: Vascular;  Laterality: Left;  . IR FLUORO GUIDE CV LINE RIGHT  04/06/2020  . IR RADIOLOGIST EVAL & MGMT  07/28/2019  . IR RADIOLOGIST EVAL & MGMT  08/11/2019  . LIGATION OF ARTERIOVENOUS  FISTULA Left 11/11/2019  . LIGATION OF ARTERIOVENOUS  FISTULA Left 11/11/2019   Procedure: LIGATION OF ARTERIOVENOUS  FISTULA;  Surgeon: Katha Cabal, MD;  Location: [facility] ORS;  Service: Vascular;  Laterality: Left;  . PERIPHERAL VASCULAR CATHETERIZATION N/A 12/20/2015   Procedure: Thrombectomy of dialysis access versus permcath placement;  Surgeon: Algernon Huxley, MD;  Location: [place] CV LAB;  Service: Cardiovascular;  Laterality: N/A;  . PERIPHERAL VASCULAR CATHETERIZATION N/A 12/20/2015   Procedure: A/V Shunt Intervention;  Surgeon: Algernon Huxley, MD;  Location: [place] CV LAB;  Service: Cardiovascular;  Laterality: N/A;  . PERIPHERAL VASCULAR CATHETERIZATION N/A 12/20/2015   Procedure: A/V Shuntogram/Fistulagram;  Surgeon: Algernon Huxley, MD;   Location: [place] CV LAB;  Service: Cardiovascular;  Laterality: N/A;  . PERIPHERAL VASCULAR CATHETERIZATION N/A 01/02/2016   Procedure: A/V Shuntogram/Fistulagram;  Surgeon: Algernon Huxley, MD;  Location: [place] CV LAB;  Service: Cardiovascular;  Laterality: N/A;  . PERIPHERAL VASCULAR CATHETERIZATION N/A 01/02/2016   Procedure: A/V Shunt Intervention;  Surgeon: Algernon Huxley, MD;  Location: [place] CV LAB;  Service: Cardiovascular;  Laterality: N/A;  . UPPER EXTREMITY VENOGRAPHY Right 01/18/2020   Procedure: UPPER EXTREMITY VENOGRAPHY;  Surgeon: Katha Cabal, MD;  Location: [place] CV LAB;  Service: Cardiovascular;  Laterality: Right;  Marland Kitchen VASCULAR ACCESS DEVICE INSERTION Right 04/13/2020   Procedure: INSERTION OF HERO VASCULAR ACCESS DEVICE (GRAFT);  Surgeon: Katha Cabal, MD;  Location: [facility] ORS;  Service: Vascular;  Laterality: Right;    Prior to Admission medications   Medication Sig Start Date End Date Taking? Authorizing Provider  albuterol (PROVENTIL) (2.5 MG/3ML) 0.083% nebulizer solution Take 2.5 mg by nebulization every 4 (four) hours as needed [name] wheezing or shortness of breath.    [provider]  albuterol (VENTOLIN HFA) 108 (90 Base) MCG/ACT inhaler Inhale 2 puffs into the lungs every 4 (four) hours as needed [name] wheezing or shortness of breath. 11/09/19   [provider]  amLODipine (NORVASC) 10 MG tablet Take 10 mg by mouth daily.  10/24/19   [provider]  ammonium lactate (LAC-HYDRIN) 12 % lotion Apply 1 application topically in the morning and at bedtime.    [provider]  Ascorbic Acid (VITA-C PO) Take 1 tablet by mouth daily.    [provider]  aspirin EC 81 MG tablet Take 81 mg by mouth daily.    [provider]  atorvastatin (LIPITOR) 20 MG tablet Take 20 mg by mouth every evening.  10/24/19   [provider]  carvedilol (COREG) 6.25 MG tablet Take 6.25 mg by mouth 2 (two) times  daily. 10/24/19   [provider]  Cholecalciferol (VITAMIN D3) 25 MCG (1000 UT) CAPS Take 1,000 Units by mouth daily.     [provider]  cinacalcet (SENSIPAR) 30 MG tablet Take 30 mg by mouth daily.    [provider]  citalopram (CELEXA) 20 MG tablet Take 20 mg by mouth daily. 10/29/19   [provider]  clopidogrel (PLAVIX) 75 MG tablet Take 75 mg by mouth at bedtime.  07/01/19   [provider]  COMBIVENT RESPIMAT 20-100 MCG/ACT AERS respimat Inhale 2 puffs into the lungs every 6 (six) hours as needed [name] wheezing or shortness of breath. Shortness of breath 11/30/19   [provider]  CREON 12000 units CPEP capsule Take 12,000 Units by mouth See admin instructions. Take 36,000 with meals and 24,000 with snacks three times a day 10/21/19   [provider]  cyanocobalamin 1000 MCG tablet Take 1,000 mcg by mouth daily.     [provider]  diclofenac sodium (VOLTAREN) 1 % GEL Apply 1 application topically in the morning and at bedtime.     [provider]  diphenhydrAMINE (BENADRYL) 25 MG tablet Take 25 mg by mouth every 6 (six) hours as needed [name] itching.    [provider]  Ferrous Sulfate (IRON) 325 (65 Fe) MG TABS Take 325 mg by mouth daily.     [provider]  gabapentin (NEURONTIN) 100 MG capsule Take 100 mg by mouth 2 (two) times daily.  10/29/19   [provider]  hydrALAZINE (APRESOLINE) 10 MG tablet Take 10 mg by mouth 3 (three) times daily.     [provider]  HYDROcodone-acetaminophen (NORCO/VICODIN) 5-325 MG tablet Take 1 tablet by mouth 4 (four) times daily as needed [name] moderate pain.  10/20/19   [provider]  hydrOXYzine (ATARAX/VISTARIL) 25 MG tablet Take 25 mg by mouth See admin instructions. Take on Tues, Thurs, and sat after dialysis Monday Wednesday Friday and Sunday daily in the morning    [provider]  insulin aspart (NOVOLOG) 100 UNIT/ML  injection Inject 4-15 Units into the skin in the morning, at noon, in the evening, and at bedtime. Sliding scale    [provider]  insulin detemir (LEVEMIR) 100 UNIT/ML injection Inject 67 Units into the skin at bedtime.    [provider]  meclizine (ANTIVERT) 25 MG tablet Take 12.5 mg by mouth every 6 (six) hours.  11/01/19   [provider]  multivitamin (RENA-VIT) TABS tablet Take 1 tablet by mouth daily.     [provider]  mupirocin ointment (BACTROBAN) 2 % Apply 1 application topically daily as needed (leg rash).  11/09/19   [provider]  nitroGLYCERIN (NITROSTAT) 0.4 MG SL tablet Place 0.4 mg under the tongue every 5 (five) minutes as needed [name] chest pain.     [provider]  Omega-3 300 MG CAPS Take 300 mg by mouth 2 (two) times daily. Every other day opposite omega XL    [provider]  oxyCODONE-acetaminophen (PERCOCET) 5-325 MG tablet Take 1-2 tablets by mouth every 6 (six) hours as needed [name] moderate pain or severe pain. 04/13/20 04/13/21  Schnier, Dolores Lory, MD  pantoprazole (PROTONIX) 40 MG tablet Take 40 mg by mouth daily. 08/28/19   [provider]  Probiotic Product (PROBIOTIC PO) Take 1 capsule by mouth daily.    [provider]  promethazine (PHENERGAN) 12.5 MG tablet Take 1 tablet (12.5 mg total) by mouth every 8 (eight) hours as needed [name] nausea. 11/17/19   Lorella Nimrod, MD  sevelamer carbonate (RENVELA) 800 MG tablet Take 800-2,400 mg by mouth See admin instructions. Take 2400 mg by mouth 3 times daily with meals and take [phone] mg by mouth with snacks    [provider]  Turmeric (QC TUMERIC COMPLEX PO) Take 250 mg by mouth daily.     [provider]  Zinc 50 MG CAPS Take 50 mg by mouth daily.     [provider]    Allergies Ace inhibitors, Ativan [lorazepam], Compazine [prochlorperazine edisylate], Sumatriptan succinate, Zofran [ondansetron], Lac bovis,  Losartan, Prochlorperazine, Reglan [metoclopramide], Scopolamine, Tape, and Tapentadol  Family History  Problem Relation Age of Onset  . Kidney disease Mother   . Diabetes Mother   . Cancer Father   . Kidney disease Sister     Social History Social History   Tobacco Use  . Smoking status: Never Smoker  . Smokeless tobacco: Never Used  Vaping Use  . Vaping Use: Never used  Substance Use Topics  . Alcohol use: Not Currently    Comment: glass wine week per pt  . Drug use: Yes    Types: Marijuana    Comment: once a day    Review of Systems  Constitutional: No fever/chills Eyes: No visual changes. ENT: No sore throat. Cardiovascular: Denies chest pain. Respiratory: Denies shortness of breath. Gastrointestinal: No abdominal pain.  No nausea, no vomiting.  No diarrhea.  No constipation. Genitourinary: Negative [name] dysuria. Musculoskeletal: Negative [name] back pain. Skin: Negative [name] rash. Neurological: Negative [name] headaches, focal weakness   ____________________________________________   PHYSICAL EXAM:  VITAL SIGNS: ED Triage Vitals  Enc Vitals Group     BP 05/02/20 1725 122/82     Pulse Rate 05/02/20 1725 70     Resp 05/02/20 1725 18     Temp 05/02/20 1725 98.3 F (36.8 C)     Temp Source 05/02/20 1725 Oral     SpO2 05/02/20 1725 96 %     Weight 05/02/20 1728 180 lb 12.4 oz (82 kg)     Height 05/02/20 1728 5\' 5"  (1.651 m)     Head Circumference --      Peak Flow --      Pain Score 05/02/20 1726 8     Pain Loc --  Pain Edu? --      Excl. in [place]? --     Constitutional: Alert and oriented.  In pain and crying Eyes: Conjunctivae are normal. Head: Atraumatic. Nose: No congestion/rhinnorhea. Mouth/Throat: Mucous membranes are moist.  Oropharynx non-erythematous. Neck: No stridor.  Cardiovascular: Normal rate, regular rhythm. Grossly normal heart sounds.  Good peripheral circulation. Respiratory: Normal respiratory effort.  No retractions. Lungs  CTAB. Gastrointestinal: Soft and nontender. No distention. No abdominal bruits. No CVA tenderness. Musculoskeletal: Right thigh red hot swollen and tender around the catheter site. Neurologic:  Normal speech and language. No gross focal neurologic deficits are appreciated.  Skin:  Skin is warm, dry and intact. No rash noted.   ____________________________________________   LABS (all labs ordered are listed, but only abnormal results are displayed)  Labs Reviewed  CULTURE, BLOOD (ROUTINE X 2)  CULTURE, BLOOD (ROUTINE X 2)  CBC WITH DIFFERENTIAL/PLATELET  COMPREHENSIVE METABOLIC PANEL  LACTIC ACID, PLASMA  LACTIC ACID, PLASMA   ____________________________________________  EKG EKG read interpreted by me shows normal sinus rhythm rate of 67 normal axis no acute ST-T wave changes are seen  ____________________________________________  RADIOLOGY  ED MD interpretation:    Official radiology report(s): No results found.  ____________________________________________   PROCEDURES  Procedure(s) performed (including Critical Care):  Procedures   ____________________________________________   INITIAL IMPRESSION / ASSESSMENT AND PLAN / ED COURSE    Clinically patient has a line infection.  Regardless of what her labs look like we will have to get her in the hospital and treat her.  I believe the headache is slightly due to the infection.   ----------------------------------------- 8:53 PM on 05/02/2020 -----------------------------------------  Patient's blood pressure has dropped.  We will try some fluid boluses while monitoring her blood pressure and pulse ox and see if that will help.  Additionally I have ordered antibiotics       ____________________________________________   FINAL CLINICAL IMPRESSION(S) / ED DIAGNOSES  Final diagnoses:  Cellulitis of right lower extremity  Actual diagnosis is infected dialysis catheter but of course this is not in the  computer in a way that I can use it.   ED Discharge Orders    None       Note:  This document was prepared using Dragon voice recognition software and may include unintentional dictation errors.    Nena Polio, MD 05/02/20 1750    Nena Polio, MD 05/02/20 2052    Nena Polio, MD 05/02/20 2053

## 2020-05-03 DIAGNOSIS — N186 End stage renal disease: Secondary | ICD-10-CM | POA: Diagnosis not present

## 2020-05-03 DIAGNOSIS — M79651 Pain in right thigh: Secondary | ICD-10-CM

## 2020-05-03 DIAGNOSIS — T829XXD Unspecified complication of cardiac and vascular prosthetic device, implant and graft, subsequent encounter: Secondary | ICD-10-CM

## 2020-05-03 DIAGNOSIS — E1121 Type 2 diabetes mellitus with diabetic nephropathy: Secondary | ICD-10-CM

## 2020-05-03 DIAGNOSIS — K219 Gastro-esophageal reflux disease without esophagitis: Secondary | ICD-10-CM

## 2020-05-03 DIAGNOSIS — D631 Anemia in chronic kidney disease: Secondary | ICD-10-CM

## 2020-05-03 LAB — GLUCOSE, CAPILLARY
Glucose-Capillary: 78 mg/dL (ref 70–99)
Glucose-Capillary: 117 mg/dL — ABNORMAL HIGH (ref 70–99)

## 2020-05-03 LAB — BASIC METABOLIC PANEL
Anion gap: 11 (ref 5–15)
BUN: 36 mg/dL — ABNORMAL HIGH (ref 6–20)
CO2: 22 mmol/L (ref 22–32)
Calcium: 7.6 mg/dL — ABNORMAL LOW (ref 8.9–10.3)
Chloride: 102 mmol/L (ref 98–111)
Creatinine, Ser: 7.38 mg/dL — ABNORMAL HIGH (ref 0.44–1.00)
GFR calc Af Amer: 6 mL/min — ABNORMAL LOW (ref >60)
GFR calc non Af Amer: 5 mL/min — ABNORMAL LOW (ref >60)
Glucose, Bld: 75 mg/dL (ref 70–99)
Potassium: 5.3 mmol/L — ABNORMAL HIGH (ref 3.5–5.1)
Sodium: 135 mmol/L (ref 135–145)

## 2020-05-03 LAB — CBC
HCT: 23.1 % — ABNORMAL LOW (ref 36.0–46.0)
HCT: 24.1 % — ABNORMAL LOW (ref 36.0–46.0)
Hemoglobin: 7.6 g/dL — ABNORMAL LOW (ref 12.0–15.0)
Hemoglobin: 8.2 g/dL — ABNORMAL LOW (ref 12.0–15.0)
MCH: 30 pg (ref 26.0–34.0)
MCH: 30.1 pg (ref 26.0–34.0)
MCHC: 32.9 g/dL (ref 30.0–36.0)
MCHC: 34 g/dL (ref 30.0–36.0)
MCV: 91.3 fL (ref 80.0–100.0)
MCV: 88.6 fL (ref 80.0–100.0)
Platelets: 94 10*3/uL — ABNORMAL LOW (ref 150–400)
Platelets: 92 10*3/uL — ABNORMAL LOW (ref 150–400)
RBC: 2.53 MIL/uL — ABNORMAL LOW (ref 3.87–5.11)
RBC: 2.72 MIL/uL — ABNORMAL LOW (ref 3.87–5.11)
RDW: 15.9 % — ABNORMAL HIGH (ref 11.5–15.5)
RDW: 15.9 % — ABNORMAL HIGH (ref 11.5–15.5)
WBC: 7.5 10*3/uL (ref 4.0–10.5)
WBC: 6.1 10*3/uL (ref 4.0–10.5)
nRBC: 0 % (ref 0.0–0.2)
nRBC: 0 % (ref 0.0–0.2)

## 2020-05-03 LAB — MRSA PCR SCREENING: MRSA by PCR: NEGATIVE

## 2020-05-03 MED ORDER — PANCRELIPASE (LIP-PROT-AMYL) 12000-38000 UNITS PO CPEP
24000.0000 [IU] | ORAL_CAPSULE | ORAL | Status: DC
  Administered 2020-05-04 – 2020-05-06 (×4): 24000 [IU] via ORAL
  Filled 2020-05-03 (×12): qty 2

## 2020-05-03 MED ORDER — MORPHINE SULFATE (PF) 2 MG/ML IV SOLN
2.0000 mg | INTRAVENOUS | Status: DC | PRN
  Administered 2020-05-03 – 2020-05-04 (×4): 2 mg via INTRAVENOUS
  Filled 2020-05-03 (×5): qty 1

## 2020-05-03 MED ORDER — CINACALCET HCL 30 MG PO TABS
30.0000 mg | ORAL_TABLET | Freq: Every day | ORAL | Status: DC
  Administered 2020-05-04 – 2020-05-05 (×2): 30 mg via ORAL
  Filled 2020-05-03 (×4): qty 1

## 2020-05-03 MED ORDER — SEVELAMER CARBONATE 800 MG PO TABS
800.0000 mg | ORAL_TABLET | ORAL | Status: DC
  Administered 2020-05-03 – 2020-05-06 (×5): 800 mg via ORAL
  Filled 2020-05-03 (×12): qty 1

## 2020-05-03 MED ORDER — VANCOMYCIN HCL IN DEXTROSE 1-5 GM/200ML-% IV SOLN
1000.0000 mg | Freq: Once | INTRAVENOUS | Status: DC
  Filled 2020-05-03: qty 200

## 2020-05-03 MED ORDER — INSULIN DETEMIR 100 UNIT/ML ~~LOC~~ SOLN
30.0000 [IU] | Freq: Every day | SUBCUTANEOUS | Status: DC
  Filled 2020-05-03 (×2): qty 0.3

## 2020-05-03 MED ORDER — INSULIN ASPART 100 UNIT/ML ~~LOC~~ SOLN: 0.0000 [IU] | Freq: Three times a day (TID) | SUBCUTANEOUS | Status: DC

## 2020-05-03 MED ORDER — CHLORHEXIDINE GLUCONATE CLOTH 2 % EX PADS
6.0000 | MEDICATED_PAD | Freq: Every day | CUTANEOUS | Status: DC
  Administered 2020-05-03 – 2020-05-06 (×3): 6 via TOPICAL

## 2020-05-03 MED ORDER — HYDROXYZINE HCL 25 MG PO TABS
25.0000 mg | ORAL_TABLET | Freq: Four times a day (QID) | ORAL | Status: DC | PRN
  Filled 2020-05-03: qty 1

## 2020-05-03 MED ORDER — HYDROXYZINE HCL 50 MG/ML IM SOLN
50.0000 mg | Freq: Four times a day (QID) | INTRAMUSCULAR | Status: DC | PRN
  Administered 2020-05-03: 50 mg via INTRAMUSCULAR
  Filled 2020-05-03 (×2): qty 1

## 2020-05-03 MED ORDER — DRONABINOL 2.5 MG PO CAPS
5.0000 mg | ORAL_CAPSULE | Freq: Four times a day (QID) | ORAL | Status: DC | PRN
  Administered 2020-05-03: 5 mg via ORAL
  Filled 2020-05-03: qty 2

## 2020-05-03 MED ORDER — EPOETIN ALFA 10000 UNIT/ML IJ SOLN
10000.0000 [IU] | INTRAMUSCULAR | Status: DC
  Administered 2020-05-03 – 2020-05-05 (×2): 10000 [IU] via INTRAVENOUS
  Filled 2020-05-03: qty 1

## 2020-05-03 NOTE — Consult Note (Signed)
Pharmacy Antibiotic Note  [name] is a 59 y.o. female admitted on 05/02/2020 with cellulitis.  Pharmacy has been consulted for Vancomycin dosing.  Plan: Continue Ceftriaxone 1g q 24h  Pt received a partial loading dose of Vancomycin 1000mg  IV x 1 in ED 8/11 @ 2103 -> will finish load now with another 1000mg  IV x 1   Will hold further dosing pending inpatient HD plan   Height: 5\' 6"  (167.6 cm) Weight: 81.6 kg (180 lb) IBW/kg (Calculated) : 59.3  Temp (24hrs), Avg:97.9 F (36.6 C), Min:97.7 F (36.5 C), Max:98.3 F (36.8 C)  Recent Labs  Lab 05/02/20 1751 05/02/20 2111 05/03/20 0413  WBC 9.6 8.5 7.5  CREATININE 6.91* 7.18* 7.38*  LATICACIDVEN 0.8 0.5  --     Estimated Creatinine Clearance: 8.7 mL/min (A) (by C-G formula based on SCr of 7.38 mg/dL (H)).    Allergies  Allergen Reactions  . Ace Inhibitors Swelling and Anaphylaxis  . Ativan [Lorazepam] Other (See Comments)    Reaction:Hallucinations and headaches  . Compazine [Prochlorperazine Edisylate] Anaphylaxis, Nausea And Vomiting and Other (See Comments)    Other reaction(s): dystonia from this vs. Reglan, 23 Jul - patient relates that she takes promethazine frequently with no problems  . Sumatriptan Succinate Other (See Comments)    Other reaction(s): delirium and hallucinations per [facility] records  . Zofran [Ondansetron] Nausea And Vomiting    Per pt. she is allergic to zofran or will experience adverse reaction like hallucination   . Lac Bovis Nausea And Vomiting  . Losartan Nausea Only  . Prochlorperazine Other (See Comments)    Reaction:  Unknown . Patient does not remember reaction but she does have vertigo and anxiety along with n and v at times. Could be used to treat any of these   . Reglan [Metoclopramide] Other (See Comments)    Per patient her Dr. Evelina Bucy her off it   . Scopolamine Other (See Comments)    Dizziness, also has vertigo already  . Tape Rash    Plastic tape causes rash  . Tapentadol Rash     Antimicrobials this admission: Vancomycin 8/11 >> Ceftriaxone 8/11 >>  Dose adjustments this admission: None  Microbiology results: 8/11 BCx: pending  MRSA PCR pending  COVID NEG  Thank you for allowing pharmacy to be a part of this patient's care.  Lu Duffel, PharmD, BCPS Clinical Pharmacist 05/03/2020 8:33 AM

## 2020-05-03 NOTE — Progress Notes (Signed)
Hemodialysis patient known at [place] 6:00am. Patient rides with [facility]. Please contact me with dialysis placement concerns.  [facility]a [facility]  Coordinator [phone]

## 2020-05-03 NOTE — TOC Initial Note (Signed)
Transition of Care [facility]) - Initial/Assessment Note    Patient Details  Name: [name] MRN: [id number] Date of Birth: 21-Sep-1961  Transition of Care [facility]) CM/SW Contact:    Candie Chroman, LCSW Phone Number: 05/03/2020, 10:48 AM  Clinical Narrative:  Readmission prevention screen complete. CSW met with patient. No supports at bedside. CSW introduced role and explained that discharge planning would be discussed. PCP is Princella Ion but she is working on getting it switched to someone in [place]. Patient uses [facility] to get to appointments. [facility] [place]. No issues obtaining medications. Patient did not have home health services prior to admission. She has a walker, cane, inhalers, and nebulizer machine at home. Patient is not on oxygen at home. Will follow for this potential need. No further concerns. CSW encouraged patient to contact CSW as needed. CSW will continue to follow patient for support and facilitate return home when stable.                Expected Discharge Plan: Home/Self Care Barriers to Discharge: Continued Medical Work up   Patient Goals and CMS Choice        Expected Discharge Plan and Services Expected Discharge Plan: Home/Self Care     Post Acute Care Choice: NA Living arrangements for the past 2 months: Single Family Home                                      Prior Living Arrangements/Services Living arrangements for the past 2 months: Single Family Home   Patient language and need for interpreter reviewed:: Yes Do you feel safe going back to the place where you live?: Yes      Need for Family Participation in Patient Care: Yes (Comment)   Current home services: DME Criminal Activity/Legal Involvement Pertinent to Current Situation/Hospitalization: No - Comment as needed  Activities of Daily Living Home Assistive Devices/Equipment: Cane (specify quad or straight), Walker (specify type) ADL Screening (condition at time of  admission) Patient's cognitive ability adequate to safely complete daily activities?: Yes Is the patient deaf or have difficulty hearing?: No Does the patient have difficulty seeing, even when wearing glasses/contacts?: No Does the patient have difficulty concentrating, remembering, or making decisions?: No Patient able to express need for assistance with ADLs?: Yes Does the patient have difficulty dressing or bathing?: No Independently performs ADLs?: Yes (appropriate for developmental age) Does the patient have difficulty walking or climbing stairs?: Yes Weakness of Legs: Both Weakness of Arms/Hands: Both  Permission Sought/Granted                  Emotional Assessment Appearance:: Appears stated age Attitude/Demeanor/Rapport: Lethargic, Engaged Affect (typically observed): Accepting, Calm Orientation: : Oriented to Self, Oriented to Place, Oriented to  Time, Oriented to Situation Alcohol / Substance Use: Not Applicable Psych Involvement: No (comment)  Admission diagnosis:  Cellulitis of right leg [L03.115] Cellulitis of right lower extremity [L03.115] Patient Active Problem List   Diagnosis Date Noted  . Cellulitis of right leg 05/02/2020  . ESRD (end stage renal disease) ([place]) 04/13/2020  . Hematoma of arm, left, subsequent encounter 11/24/2019  . Respiratory failure ([place])   . Shortness of breath   . Complication of arteriovenous dialysis fistula 11/13/2019  . PVD (peripheral vascular disease) ([place]) 11/13/2019  . Severe anemia 11/13/2019  . Pressure injury of skin 11/13/2019  . ESRD on hemodialysis ([place])   .  Hemorrhagic shock (HCC) 11/11/2019  . Leg pain 10/02/2019  . CAP (community acquired pneumonia) 10/01/2019  . Atherosclerosis of native arteries of extremity with intermittent claudication (HCC) 05/01/2019  . Right-sided headache   . COPD (chronic obstructive pulmonary disease) (HCC) 10/06/2018  . Syncope 10/04/2018  . Symptomatic anemia 06/18/2018  .  Gastroparesis due to DM (HCC) 01/18/2018  . Complication of vascular access for dialysis 12/04/2017  . Osteomyelitis (HCC) 09/30/2017  . Carotid stenosis 06/18/2017  . Shortness of breath 05/04/2017  . Cellulitis of lower extremity 07/29/2016  . Chronic venous insufficiency 07/29/2016  . Lymphedema 07/29/2016  . TIA (transient ischemic attack) 04/21/2016  . Altered mental status 04/08/2016  . Hyperammonemia (HCC) 04/08/2016  . Elevated troponin 04/08/2016  . Depression 04/08/2016  . Depression, major, recurrent, severe with psychosis (HCC) 04/08/2016  . Blood in stool   . Intractable cyclical vomiting with nausea   . Reflux esophagitis   . Gastritis   . Generalized abdominal pain   . Uncontrollable vomiting   . Major depressive disorder, recurrent episode, moderate (HCC) 03/15/2016  . Adjustment disorder with mixed anxiety and depressed mood 03/15/2016  . Malnutrition of moderate degree 12/01/2015  . Renal mass   . Dyspnea   . Acute renal failure (HCC)   . Respiratory failure (HCC)   . High temperature 11/14/2015  . Pulmonary edema   . Encounter for central line placement   . Encounter for orogastric (OG) tube placement   . Nausea 11/12/2015  . Hyperkalemia 10/03/2015  . Diarrhea, unspecified 07/22/2015  . Pneumonia 05/21/2015  . Hypoglycemia 04/24/2015  . Unresponsiveness 04/24/2015  . Bradycardia 04/24/2015  . Hypothermia 04/24/2015  . Acute respiratory failure (HCC) 04/24/2015  . Acute diastolic CHF (congestive heart failure) (HCC) 04/05/2015  . Diabetic gastroparesis (HCC) 04/05/2015  . Hypokalemia 04/05/2015  . Generalized weakness 04/05/2015  . Acute pulmonary edema (HCC) 04/03/2015  . Nausea and vomiting 04/03/2015  . Hypoglycemia associated with diabetes (HCC) 04/03/2015  . Anemia of chronic disease 04/03/2015  . Secondary hyperparathyroidism (HCC) 04/03/2015  . Pressure ulcer 04/02/2015  . Acute respiratory failure with hypoxia (HCC) 04/01/2015  .  Adjustment disorder with anxiety 03/14/2015  . Somatic symptom disorder, mild 03/08/2015  . Coronary artery disease involving native coronary artery of native heart without angina pectoris   . Nausea & vomiting 03/06/2015  . Abdominal pain 03/06/2015  . DM (diabetes mellitus) (HCC) 03/06/2015  . HTN (hypertension) 03/06/2015  . Gastroparesis 02/24/2015  . Pleural effusion 02/19/2015  . HCAP (healthcare-associated pneumonia) 02/19/2015  . End-stage renal disease on hemodialysis (HCC) 02/19/2015   PCP:  [facility]:   [id number] [place], [place] - [address] [place] [address] Phone: [phone] Fax: [phone]     Social Determinants of Health (SDOH) Interventions    Readmission Risk Interventions Readmission Risk Prevention Plan 05/03/2020  Transportation Screening Complete  Medication Review (RN Care Manager) Complete  PCP or Specialist appointment within 3-5 days of discharge Complete  SW Recovery Care/Counseling Consult Complete  Palliative Care Screening Not Applicable  Skilled Nursing Facility Not Applicable  Some recent data might be hidden    

## 2020-05-03 NOTE — Plan of Care (Signed)
  Problem: Education: Goal: Knowledge of General Education information will improve Description: Including pain rating scale, medication(s)/side effects and non-pharmacologic comfort measures Outcome: Progressing   Problem: Pain Managment: Goal: General experience of comfort will improve Outcome: Progressing   Problem: Safety: Goal: Ability to remain free from injury will improve Outcome: Progressing   Problem: Skin Integrity: Goal: Risk for impaired skin integrity will decrease Outcome: Progressing   

## 2020-05-03 NOTE — Plan of Care (Signed)
  Problem: Education: Goal: Knowledge of General Education information will improve Description: Including pain rating scale, medication(s)/side effects and non-pharmacologic comfort measures 05/03/2020 0547 by Noah Delaine, RN Outcome: Progressing 05/03/2020 0202 by Noah Delaine, RN Outcome: Progressing   Problem: Pain Managment: Goal: General experience of comfort will improve 05/03/2020 0547 by Noah Delaine, RN Outcome: Progressing 05/03/2020 0202 by Noah Delaine, RN Outcome: Progressing   Problem: Safety: Goal: Ability to remain free from injury will improve 05/03/2020 0547 by Noah Delaine, RN Outcome: Progressing 05/03/2020 0202 by Noah Delaine, RN Outcome: Progressing   Problem: Skin Integrity: Goal: Risk for impaired skin integrity will decrease 05/03/2020 0547 by Noah Delaine, RN Outcome: Progressing 05/03/2020 0202 by Noah Delaine, RN Outcome: Progressing

## 2020-05-03 NOTE — Progress Notes (Signed)
[facility] Daily Progress Note   Subjective: The patient is a 59 year old female well-known to our service. On 04/13/20, the patient underwent: 1. Creation of right arm prosthetic A-V graft, HERO graft 2. Placement of venous outflow, intravascular portion of HERO graft same venous access 3. Introduction catheter into superior vena cava 4. Removal of existing permacath right external jugular vein with placement of a new tunneled catheter right common femoral vein 5. Ultrasound-guided placement of 8 French venous sheath, right neck  The patient reports no issues at the site of her recently created hero graft.  Patient notes that the area around her PermCath which is located in the right groin is painful.  Patient also notes " dark bloody drainage" around the site.  Denies fever, nausea vomiting.  Denies shortness of breath or chest pain.  Objective: Vitals:   05/02/20 2300 05/03/20 0036 05/03/20 0540 05/03/20 0726  BP: 139/60 (!) 150/55 (!) 138/58 (!) 137/52  Pulse: 61 62 (!) 55 60  Resp: 13 19 17 16   Temp:  97.7 F (36.5 C) 97.8 F (36.6 C) 97.9 F (36.6 C)  TempSrc:  Oral Oral Oral  SpO2: 93% 91% 99%   Weight:  81.6 kg    Height:  5\' 6"  (1.676 m)      Intake/Output Summary (Last 24 hours) at 05/03/2020 1144 Last data filed at 05/03/2020 1016 Gross per 24 hour  Intake 370.4 ml  Output --  Net 370.4 ml   Physical Exam: A&Ox3, NAD CV: RRR Pulmonary: CTA Bilaterally Abdomen: Soft, Nontender, Nondistended Vascular:  Right femoral PermCath: No active bleeding noted at time examination.  Some surrounding erythema.  Mild edema.  Tender to palpation.   Laboratory: CBC    Component Value Date/Time   WBC 6.1 05/03/2020 1031   HGB 8.2 (L) 05/03/2020 1031   HGB 10.5 (L) 09/15/2014 0948   HCT 24.1 (L) 05/03/2020 1031   HCT 34.2 (L) 09/15/2014 0948   PLT 92 (L) 05/03/2020 1031   PLT 203 09/15/2014 0948   BMET    Component Value Date/Time   NA 135  05/03/2020 0413   NA 135 (L) 09/15/2014 0948   K 5.3 (H) 05/03/2020 0413   K 4.8 09/15/2014 0948   CL 102 05/03/2020 0413   CL 99 09/15/2014 0948   CO2 22 05/03/2020 0413   CO2 26 09/15/2014 0948   GLUCOSE 75 05/03/2020 0413   GLUCOSE 118 (H) 09/15/2014 0948   BUN 36 (H) 05/03/2020 0413   BUN 19 (H) 09/15/2014 0948   CREATININE 7.38 (H) 05/03/2020 0413   CREATININE 6.79 (H) 09/15/2014 0948   CALCIUM 7.6 (L) 05/03/2020 0413   CALCIUM 8.3 (L) 09/15/2014 0948   GFRNONAA 5 (L) 05/03/2020 0413   GFRNONAA 7 (L) 09/15/2014 0948   GFRNONAA 6 (L) 05/31/2014 0432   GFRAA 6 (L) 05/03/2020 0413   GFRAA 8 (L) 09/15/2014 0948   GFRAA 7 (L) 05/31/2014 0432   Assessment/Planning: The patient is a 59 year old woman well-known to our service with recent hero creation who presents with possible hematoma to right femoral PermCath  1) ESRD: Patient's hero creation was approximately 3 weeks ago. Okay to try to cannulate during next dialysis treatment  2) Hematoma to right groin PermCath: If patient's hero graft is functioning appropriately we will plan on removal of PermCath  Discussed with Dr. Ellis Parents [facility] PA-C 05/03/2020 11:44 AM

## 2020-05-03 NOTE — Progress Notes (Signed)
PROGRESS NOTE  [name] BZJ:[id number] DOB: [birth year] DOA: 05/02/2020 PCP: [facility] [place]  Brief History   [name]  is a 59 y.o. African-American female with a known history of end-stage renal disease on hemodialysis, currently through her right thigh catheter, coronary artery disease, status post PCI and stent, chronic diastolic CHF, COPD and type 2 diabetes mellitus, who presented to the emergency room with acute onset of right thigh pain with associated swelling, mild redness, warmth and tenderness that started at 3 AM today.  She has been feeling cold but denies any fever.  No nausea or vomiting or abdominal pain.  No chest pain or dyspnea or cough or wheezing.  She had her hemodialysis today.  She was having a headache without paresthesias or focal muscle weakness.  Upon presentation to the emergency room, vital signs were within normal with a blood pressure 122/82 and later 74/50 with improvement with hydration to 112/60.  Labs revealed mild hyperkalemia 5.6 and mild hyponatremia hypochloremia, BUN of 33 and creatinine 6.91.  CBC showed hemoglobin of 7.8 and hematocrit of 24.6 compared to 8.5 and 27.1.  COVID-19 PCR is currently pending.  Noncontrasted head CT scan revealed no acute intracranial abnormalities.  The patient is a 59 year old female. On 04/13/20, the patient underwent with vascular surgery: 1. Creation ofrightarm prosthetic A-V graft, HERO graft 2. Placement of venous outflow, intravascular portion of HERO graftsame venous access 3. Introduction catheter into superior vena cava 4. Removal of existing permacath right external jugular vein with placement of a new tunneled catheter right common femoral vein 5. Ultrasound-guided placement of 8 French venous sheath,rightneck  She presented to [facility] ED on 05/02/2020 with pain, swelling and oozing of dark material from the right femoral catheter site.  The patient was given IV Rocephin vancomycin and 250  mL IV normal saline, 12.5 mg of IV Phenergan and 50 mcg of IV fentanyl.  Given stabilization of her blood pressure, she will be admitted to a medically monitored bed for further evaluation and management. She was started on IV ceftriaxone and Vancomycin.  The patient has been evaluated by Dr. Juleen China and Dr. Lucky Cowboy for vascular surgery. She has had no fevers, and no elevation of WBC. It is felt that the swelling and induration to the right thigh is due to bleeding at the HD catheter site and infiltration from the catheter. Antibiotics have been discontinued.  The patient has a right upper extremity graft that was placed about 3 weeks ago. The plan is to try to cannulate at her next dialysis treatment. If it is functioning properly PermCath will be removed.   Consultants  . Nephrology . Vascular surgery  Procedures  . Hemodialysis  Antibiotics   Anti-infectives (From admission, onward)   Start     Dose/Rate Route Frequency Ordered Stop   05/03/20 2100  cefTRIAXone (ROCEPHIN) 1 g in sodium chloride 0.9 % 100 mL IVPB  Status:  Discontinued        1 g 200 mL/hr over 30 Minutes Intravenous Every 24 hours 05/02/20 2056 05/03/20 1122   05/03/20 1000  vancomycin (VANCOCIN) IVPB 1000 mg/200 mL premix  Status:  Discontinued        1,000 mg 200 mL/hr over 60 Minutes Intravenous  Once 05/03/20 0828 05/03/20 1122   05/02/20 2045  cefTRIAXone (ROCEPHIN) 1 g in sodium chloride 0.9 % 100 mL IVPB        1 g 200 mL/hr over 30 Minutes Intravenous  Once 05/02/20 2033 05/02/20  2207   05/02/20 2045  vancomycin (VANCOCIN) IVPB 1000 mg/200 mL premix        1,000 mg 200 mL/hr over 60 Minutes Intravenous  Once 05/02/20 2033 05/02/20 2357    .  Subjective  The patient is resting quietly. She is complaining of pain in the upper right thigh. She has also had a great deal of nausea today. This has been complicated by her stated anaphylaxis in response to phenothiazines.  Objective   Vitals:  Vitals:   05/03/20  0540 05/03/20 0726  BP: (!) 138/58 (!) 137/52  Pulse: (!) 55 60  Resp: 17 16  Temp: 97.8 F (36.6 C) 97.9 F (36.6 C)  SpO2: 99%     I have personally reviewed the following:   Today's Data  . Vitals, BMP, CBC  Micro Data  . Blood cultures x 1 - no growth.  Cardiology Data  . EKG - NSR  Scheduled Meds: . vitamin C  500 mg Oral Daily  . aspirin EC  81 mg Oral Daily  . atorvastatin  20 mg Oral QPM  . Chlorhexidine Gluconate Cloth  6 each Topical Q0600  . cinacalcet  30 mg Oral Q supper  . epoetin (EPOGEN/PROCRIT) injection  10,000 Units Intravenous Q T,Th,Sa-HD  . ferrous sulfate  325 mg Oral Q breakfast  . gabapentin  100 mg Oral Daily  . hydrOXYzine  25 mg Oral Q T,Th,Sa-HD  . insulin detemir  30 Units Subcutaneous QHS  . lipase/protease/amylase  24,000 Units Oral With snacks  . lipase/protease/amylase  36,000 Units Oral TID with meals  . meclizine  12.5 mg Oral Q6H  . multivitamin  1 tablet Oral Daily  . omega-3 acid ethyl esters  1 g Oral BID  . pantoprazole  40 mg Oral Daily  . sevelamer carbonate  2,400 mg Oral TID with meals  . sevelamer carbonate  800 mg Oral With snacks  . cyanocobalamin  1,000 mcg Oral Daily  . zinc sulfate  220 mg Oral Daily   Continuous Infusions: . sodium chloride 100 mL/hr at 05/03/20 8938    Active Problems:   Cellulitis of lower extremity   Cellulitis of right leg   LOS: 1 day   A & P  Malfunction of right femoral HD catheter: With bleeding around site and likely infiltration of the tissues in the thigh. The patient has tenderness and erythema in this area, but no elevation of white count or fever. I have discussed this with Dr. Juleen China. He feels as I do that the evidence supports catheter malfunction with bleeding and infiltration far better than infection. PCR for MRSA is negative. Ceftriaxone and Vancomycin have been discontinued. Blood culture taken on 05/02/2020 has had no growth.  End-stage renal disease on hemodialysis with  current hyperkalemia: Nephrology is following. HD for today. PhosLo and Sensipar have been continued along with Vitamin D3. Potassium 5.3 this am.  Brief hypotension with history of hypertension: Noted. I have discussed this with Dr. Juleen China who knows the patient. This is not unusual for her. Aamlodipine and Coreg have been held. I will restart with parameters.  Dyslipidemia: Continue statin therapy.  Type 2 diabetes mellitus with diabetic neuropathy: The patient will be placed on supplement coverage with NovoLog. We will continue basal coverage.  GERD: Continues Protonix.  I have seen and examined this patient myself. I have spent 35 minutes in her evaluation and care.  DVT prophylaxis: Heparin sub Q CODE STATUS: Full Code Family Communication: None available Disposition: Status  is: Inpatient  Remains inpatient appropriate because:IV treatments appropriate due to intensity of illness or inability to take PO   Dispo: The patient is from: Home              Anticipated d/c is to: Home              Anticipated d/c date is: 2 days              Patient currently is not medically stable to d/c.  Robin Rohe, DO [facility] Direct contact: see [email or web address]  7PM-7AM contact night coverage as above 05/03/2020, 2:33 PM  LOS: 1 day

## 2020-05-03 NOTE — Progress Notes (Signed)
Pt complaining of itching. Hassan Rowan Morrison-NP notified. Waiting for response.

## 2020-05-03 NOTE — Progress Notes (Signed)
[facility]  ROUNDING NOTE   Subjective:   [name] admitted to [facility] on 05/02/2020 for Cellulitis of right leg [L03.115] Cellulitis of right lower extremity [L03.115]  Last hemodialysis treatment was 8/10.  Patient states she is having pain and tenderness with bleeding at the exit site of her right femoral dialysis catheter.   Objective:  Vital signs in last 24 hours:  Temp:  [97.7 F (36.5 C)-98.3 F (36.8 C)] 97.9 F (36.6 C) (08/12 0726) Pulse Rate:  [55-70] 60 (08/12 0726) Resp:  [13-19] 16 (08/12 0726) BP: (74-158)/(50-82) 137/52 (08/12 0726) SpO2:  [91 %-99 %] 99 % (08/12 0540) Weight:  [81.6 kg-82 kg] 81.6 kg (08/12 0036)  Weight change:  Filed Weights   05/02/20 1728 05/03/20 0036  Weight: 82 kg 81.6 kg    Intake/Output: I/O last 3 completed shifts: In: 130.4 [I.V.:130.4] Out: -    Intake/Output this shift:  Total I/O In: 240 [P.O.:240] Out: -   Physical Exam: General: NAD,   Head: Normocephalic, atraumatic. Moist oral mucosal membranes  Eyes: Anicteric, PERRL  Neck: Supple, trachea midline  Lungs:  Clear to auscultation  Heart: Regular rate and rhythm  Abdomen:  Soft, nontender,   Extremities:  Right thigh with edema and tenderness   Neurologic: Nonfocal, moving all four extremities  Skin: No lesions  Access: Right AVG maturing, Right femoral permcath with dried blood at exit site     Basic Metabolic Panel: Recent Labs  Lab 05/02/20 1751 05/02/20 2111 05/03/20 0413  NA 134*  --  135  K 5.6*  --  5.3*  CL 97*  --  102  CO2 23  --  22  GLUCOSE 115*  --  75  BUN 33*  --  36*  CREATININE 6.91* 7.18* 7.38*  CALCIUM 8.1*  --  7.6*    Liver Function Tests: Recent Labs  Lab 05/02/20 1751  AST 16  ALT 9  ALKPHOS 80  BILITOT 1.0  PROT 7.1  ALBUMIN 3.8   No results for input(s): LIPASE, AMYLASE in the last 168 hours. No results for input(s): AMMONIA in the last 168 hours.  CBC: Recent Labs  Lab 05/02/20 1751  05/02/20 2111 05/03/20 0413 05/03/20 1031  WBC 9.6 8.5 7.5 6.1  NEUTROABS 6.2  --   --   --   HGB 8.5* 7.8* 7.6* 8.2*  HCT 27.1* 24.6* 23.1* 24.1*  MCV 93.8 93.5 91.3 88.6  PLT 101* 96* 94* 92*    Cardiac Enzymes: No results for input(s): CKTOTAL, CKMB, CKMBINDEX, TROPONINI in the last 168 hours.  BNP: Invalid input(s): POCBNP  CBG: No results for input(s): GLUCAP in the last 168 hours.  Microbiology: Results for orders placed or performed during the hospital encounter of 05/02/20  Blood culture (routine x 2)     Status: None (Preliminary result)   Collection Time: 05/02/20  5:44 PM   Specimen: BLOOD  Result Value Ref Range Status   Specimen Description BLOOD  Final   Special Requests NONE  Final   Culture   Final    NO GROWTH < 24 HOURS Performed at [facility], [address], [place], [place] [address]    Report Status PENDING  Incomplete  SARS Coronavirus 2 by RT PCR (hospital order, performed in [facility] hospital lab) Nasopharyngeal Nasopharyngeal Swab     Status: None   Collection Time: 05/02/20  9:11 PM   Specimen: Nasopharyngeal Swab  Result Value Ref Range Status   SARS  Coronavirus 2 NEGATIVE NEGATIVE Final    Comment: (NOTE) SARS-CoV-2 target nucleic acids are NOT DETECTED.  The SARS-CoV-2 RNA is generally detectable in upper and lower respiratory specimens during the acute phase of infection. The lowest concentration of SARS-CoV-2 viral copies this assay can detect is 250 copies / mL. A negative result does not preclude SARS-CoV-2 infection and should not be used as the sole basis for treatment or other patient management decisions.  A negative result may occur with improper specimen collection / handling, submission of specimen other than nasopharyngeal swab, presence of viral mutation(s) within the areas targeted by this assay, and inadequate number of viral copies (<250 copies / mL). A negative result must be combined with  clinical observations, patient history, and epidemiological information.  Fact Sheet for Patients:   StrictlyIdeas.no  Fact Sheet for Healthcare Providers: BankingDealers.co.za  This test is not yet approved or  cleared by the [place] FDA and has been authorized for detection and/or diagnosis of SARS-CoV-2 by FDA under an Emergency Use Authorization (EUA).  This EUA will remain in effect (meaning this test can be used) for the duration of the COVID-19 declaration under Section 564(b)(1) of the Act, 21 U.S.C. section 360bbb-3(b)(1), unless the authorization is terminated or revoked sooner.  Performed at [facility], [address] [place], [place] [address]   MRSA PCR Screening     Status: None   Collection Time: 05/03/20  8:55 AM   Specimen: Nasopharyngeal  Result Value Ref Range Status   MRSA by PCR NEGATIVE NEGATIVE Final    Comment:        The GeneXpert MRSA Assay (FDA approved for NASAL specimens only), is one component of a comprehensive MRSA colonization surveillance program. It is not intended to diagnose MRSA infection nor to guide or monitor treatment for MRSA infections. Performed at [facility], [address] [place], [place] [address]     Coagulation Studies: No results for input(s): LABPROT, INR in the last 72 hours.  Urinalysis: No results for input(s): COLORURINE, LABSPEC, PHURINE, GLUCOSEU, HGBUR, BILIRUBINUR, KETONESUR, PROTEINUR, UROBILINOGEN, NITRITE, LEUKOCYTESUR in the last 72 hours.  Invalid input(s): APPERANCEUR    Imaging: CT Head Wo Contrast  Result Date: 05/02/2020 CLINICAL DATA:  Headache, classic migraine EXAM: CT HEAD WITHOUT CONTRAST TECHNIQUE: Contiguous axial images were obtained from the base of the skull through the vertex without intravenous contrast. COMPARISON:  10/04/2018 FINDINGS: Brain: No acute intracranial abnormality. Specifically, no hemorrhage,  hydrocephalus, mass lesion, acute infarction, or significant intracranial injury. Vascular: No hyperdense vessel or unexpected calcification. Skull: No acute calvarial abnormality. Sinuses/Orbits: Visualized paranasal sinuses and mastoids clear. Orbital soft tissues unremarkable. Other: None IMPRESSION: No acute intracranial abnormality. Electronically Signed   By: Rolm Baptise M.D.   On: 05/02/2020 18:39     Medications:   . sodium chloride 100 mL/hr at 05/03/20 0858   . vitamin C  500 mg Oral Daily  . aspirin EC  81 mg Oral Daily  . atorvastatin  20 mg Oral QPM  . calcium acetate  2,001 mg Oral TID WC  . Chlorhexidine Gluconate Cloth  6 each Topical Q0600  . epoetin (EPOGEN/PROCRIT) injection  10,000 Units Intravenous Q T,Th,Sa-HD  . ferrous sulfate  325 mg Oral Q breakfast  . gabapentin  100 mg Oral Daily  . hydrOXYzine  25 mg Oral Q T,Th,Sa-HD  . insulin detemir  30 Units Subcutaneous QHS  . lipase/protease/amylase  24,000 Units Oral With snacks  . lipase/protease/amylase  36,000  Units Oral TID with meals  . meclizine  12.5 mg Oral Q6H  . multivitamin  1 tablet Oral Daily  . omega-3 acid ethyl esters  1 g Oral BID  . pantoprazole  40 mg Oral Daily  . sevelamer carbonate  2,400 mg Oral TID with meals  . sevelamer carbonate  800 mg Oral With snacks  . cyanocobalamin  1,000 mcg Oral Daily  . zinc sulfate  220 mg Oral Daily   acetaminophen **OR** acetaminophen, albuterol, diphenhydrAMINE, dronabinol, morphine injection, mupirocin ointment, nitroGLYCERIN, oxyCODONE-acetaminophen  Assessment/ Plan:  [name] is a 59 y.o. black female with end stage renal disease on hemodialysis for 14 years, diabetes mellitus type II, diabetic gastroparesis, pancreatic deficiency, hypertension, history of renal cancer, peripheral vascular disease, coronary artery disease, GERD, COPD who has been admitted on 05/02/2020 for Cellulitis of right leg [L03.115] Cellulitis of right lower extremity  [L03.115]  [facility] left femoral permcath 81kg  1. End Stage Renal Disease: with complication of dialysis device. Scheduled for hemodialysis treatment today. Will try to use the new Hero AVG that was placed by Dr. Delana Meyer on 7/23. If not possible, will proceed to using patient's CVC - Appreciate vascular input.   2. Hypertension: Home regimen of amlodipine, carvedilol, and hydralazine.    3. Anemia of chronic kidney disease: hemoglobin 8. - EPO with HD treatment  4. Secondary Hyperparathyroidism: outpatient labs 8/10 show PTH 390.  - Continue sevelamer with meals.  - Continue cinacalcet.    LOS: 1 Sarath Kolluru 8/12/202111:50 AM

## 2020-05-04 ENCOUNTER — Encounter (INDEPENDENT_AMBULATORY_CARE_PROVIDER_SITE_OTHER): Payer: Medicare HMO

## 2020-05-04 ENCOUNTER — Ambulatory Visit (INDEPENDENT_AMBULATORY_CARE_PROVIDER_SITE_OTHER): Payer: Self-pay | Admitting: Nurse Practitioner

## 2020-05-04 DIAGNOSIS — N186 End stage renal disease: Secondary | ICD-10-CM | POA: Diagnosis not present

## 2020-05-04 DIAGNOSIS — M79651 Pain in right thigh: Secondary | ICD-10-CM | POA: Diagnosis not present

## 2020-05-04 DIAGNOSIS — E1121 Type 2 diabetes mellitus with diabetic nephropathy: Secondary | ICD-10-CM | POA: Diagnosis not present

## 2020-05-04 DIAGNOSIS — T829XXD Unspecified complication of cardiac and vascular prosthetic device, implant and graft, subsequent encounter: Secondary | ICD-10-CM | POA: Diagnosis not present

## 2020-05-04 LAB — GLUCOSE, CAPILLARY
Glucose-Capillary: 60 mg/dL — ABNORMAL LOW (ref 70–99)
Glucose-Capillary: 63 mg/dL — ABNORMAL LOW (ref 70–99)
Glucose-Capillary: 73 mg/dL (ref 70–99)
Glucose-Capillary: 119 mg/dL — ABNORMAL HIGH (ref 70–99)
Glucose-Capillary: 105 mg/dL — ABNORMAL HIGH (ref 70–99)
Glucose-Capillary: 108 mg/dL — ABNORMAL HIGH (ref 70–99)

## 2020-05-04 LAB — HEMOGLOBIN A1C
Hgb A1c MFr Bld: 5.7 % — ABNORMAL HIGH (ref 4.8–5.6)
Mean Plasma Glucose: 117 mg/dL

## 2020-05-04 MED ORDER — CYCLOBENZAPRINE HCL 10 MG PO TABS
5.0000 mg | ORAL_TABLET | Freq: Three times a day (TID) | ORAL | Status: DC | PRN
  Administered 2020-05-05 – 2020-05-06 (×3): 5 mg via ORAL
  Filled 2020-05-04 (×3): qty 1

## 2020-05-04 NOTE — Progress Notes (Signed)
Patient's BS 63 and gave apple juice and will recheck per protocols.

## 2020-05-04 NOTE — Care Management Important Message (Signed)
Important Message  Patient Details  Name: [name] MRN: [id number] Date of Birth: 05/14/1961   Medicare Important Message Given:  Yes     Willian Donson, Leroy Sea 05/04/2020, 1:21 PM

## 2020-05-04 NOTE — Progress Notes (Signed)
PROGRESS NOTE  CALEE NUGENT EGB:[id number] DOB: 1961-06-29 DOA: 05/02/2020 PCP: [facility]  Brief History   Willer Osorno  is a 59 y.o. African-American female with a known history of end-stage renal disease on hemodialysis, currently through her right thigh catheter, coronary artery disease, status post PCI and stent, chronic diastolic CHF, COPD and type 2 diabetes mellitus, who presented to the emergency room with acute onset of right thigh pain with associated swelling, mild redness, warmth and tenderness that started at 3 AM today.  She has been feeling cold but denies any fever.  No nausea or vomiting or abdominal pain.  No chest pain or dyspnea or cough or wheezing.  She had her hemodialysis today.  She was having a headache without paresthesias or focal muscle weakness.  Upon presentation to the emergency room, vital signs were within normal with a blood pressure 122/82 and later 74/50 with improvement with hydration to 112/60.  Labs revealed mild hyperkalemia 5.6 and mild hyponatremia hypochloremia, BUN of 33 and creatinine 6.91.  CBC showed hemoglobin of 7.8 and hematocrit of 24.6 compared to 8.5 and 27.1.  COVID-19 PCR is currently pending.  Noncontrasted head CT scan revealed no acute intracranial abnormalities.  The patient is a 59 year old female. On 04/13/20, the patient underwent with vascular surgery: 1. Creation ofrightarm prosthetic A-V graft, HERO graft 2. Placement of venous outflow, intravascular portion of HERO graftsame venous access 3. Introduction catheter into superior vena cava 4. Removal of existing permacath right external jugular vein with placement of a new tunneled catheter right common femoral vein 5. Ultrasound-guided placement of 8 French venous sheath,rightneck  She presented to [facility] ED on 05/02/2020 with pain, swelling and oozing of dark material from the right femoral catheter site.  The patient was given IV Rocephin vancomycin and 250  mL IV normal saline, 12.5 mg of IV Phenergan and 50 mcg of IV fentanyl.  Given stabilization of her blood pressure, she will be admitted to a medically monitored bed for further evaluation and management. She was started on IV ceftriaxone and Vancomycin.  The patient has been evaluated by Dr. Juleen China and Dr. Lucky Cowboy for vascular surgery. She has had no fevers, and no elevation of WBC. It is felt that the swelling and induration to the right thigh is due to bleeding at the HD catheter site and infiltration from the catheter. Antibiotics have been discontinued.  The patient has a right upper extremity graft that was placed about 3 weeks ago. The patient had HD yesterday through the femoral catheter. It clotted off and she was unable to complete dialysis. They plan to dialyze tomorrow through the AVG tomorrow. If this is successful, they will discuss removal of the permcath with vascular surgery.   Consultants  . Nephrology . Vascular surgery  Procedures  . Hemodialysis  Antibiotics   Anti-infectives (From admission, onward)   Start     Dose/Rate Route Frequency Ordered Stop   05/03/20 2100  cefTRIAXone (ROCEPHIN) 1 g in sodium chloride 0.9 % 100 mL IVPB  Status:  Discontinued        1 g 200 mL/hr over 30 Minutes Intravenous Every 24 hours 05/02/20 2056 05/03/20 1122   05/03/20 1000  vancomycin (VANCOCIN) IVPB 1000 mg/200 mL premix  Status:  Discontinued        1,000 mg 200 mL/hr over 60 Minutes Intravenous  Once 05/03/20 0828 05/03/20 1122   05/02/20 2045  cefTRIAXone (ROCEPHIN) 1 g in sodium chloride 0.9 % 100 mL  IVPB        1 g 200 mL/hr over 30 Minutes Intravenous  Once 05/02/20 2033 05/02/20 2207   05/02/20 2045  vancomycin (VANCOCIN) IVPB 1000 mg/200 mL premix        1,000 mg 200 mL/hr over 60 Minutes Intravenous  Once 05/02/20 2033 05/02/20 2357     Subjective  The patient is resting quietly. She continues to complain of pain in her right thigh. She is also complaining of pain in her  neck that goes up to the back of her head.  Objective   Vitals:  Vitals:   05/03/20 2359 05/04/20 0729  BP: (!) 148/51 (!) 152/50  Pulse: 73 74  Resp: 16 18  Temp: 98 F (36.7 C) 98.8 F (37.1 C)  SpO2: (!) 87% 92%    I have personally reviewed the following:   Today's Data  . Vitals, BMP, CBC  Micro Data  . Blood cultures x 1 - no growth.  Cardiology Data  . EKG - NSR  Scheduled Meds: . vitamin C  500 mg Oral Daily  . aspirin EC  81 mg Oral Daily  . atorvastatin  20 mg Oral QPM  . Chlorhexidine Gluconate Cloth  6 each Topical Q0600  . cinacalcet  30 mg Oral Q supper  . epoetin (EPOGEN/PROCRIT) injection  10,000 Units Intravenous Q T,Th,Sa-HD  . ferrous sulfate  325 mg Oral Q breakfast  . gabapentin  100 mg Oral Daily  . hydrOXYzine  25 mg Oral Q T,Th,Sa-HD  . insulin aspart  0-9 Units Subcutaneous TID WC  . lipase/protease/amylase  24,000 Units Oral With snacks  . lipase/protease/amylase  36,000 Units Oral TID with meals  . meclizine  12.5 mg Oral Q6H  . multivitamin  1 tablet Oral Daily  . omega-3 acid ethyl esters  1 g Oral BID  . pantoprazole  40 mg Oral Daily  . sevelamer carbonate  2,400 mg Oral TID with meals  . sevelamer carbonate  800 mg Oral With snacks  . cyanocobalamin  1,000 mcg Oral Daily  . zinc sulfate  220 mg Oral Daily    Active Problems:   Cellulitis of lower extremity   Cellulitis of right leg   LOS: 2 days   A & P  Malfunction of right femoral HD catheter: With bleeding around site and likely infiltration of the tissues in the thigh. The patient has tenderness and erythema in this area, but no elevation of white count or fever. I have discussed this with Dr. Juleen China. He feels as I do that the evidence supports catheter malfunction with bleeding and infiltration far better than infection. PCR for MRSA is negative. Ceftriaxone and Vancomycin have been discontinued. Blood culture taken on 05/02/2020 has had no growth. HD attempted yesterday  via the permcath. It clotted off twice and she was unable to complete her run. I have discussed the patient with Dr. Juleen China.  The plan is to attempt HD through AVG tomorrow. If successful, plans are to discuss removal of permcath with vascular surgery.  End-stage renal disease on hemodialysis with current hyperkalemia: Nephrology is following. HD for today. PhosLo and Sensipar have been continued along with Vitamin D3. Brief hypotension with history of hypertension: Noted. I have discussed this with Dr. Juleen China who knows the patient. This is not unusual for her. Aamlodipine and Coreg have been held. I will restart with parameters.  Dyslipidemia: Continue statin therapy.  Type 2 diabetes mellitus with diabetic neuropathy: The patient will be placed on  supplement coverage with NovoLog. We will continue basal coverage.  GERD: Continues Protonix.  I have seen and examined this patient myself. I have spent 35 minutes in her evaluation and care.  DVT prophylaxis: Heparin sub Q CODE STATUS: Full Code Family Communication: None available Disposition: Status is: Inpatient  Remains inpatient appropriate because:IV treatments appropriate due to intensity of illness or inability to take PO   Dispo: The patient is from: Home              Anticipated d/c is to: Home              Anticipated d/c date is: 2 days              Patient currently is not medically stable to d/c.  Sheneka Schrom, DO [facility] Direct contact: see [email or web address]  7PM-7AM contact night coverage as above 05/04/2020, 2:45 PM  LOS: 1 day

## 2020-05-04 NOTE — Progress Notes (Signed)
[facility]  ROUNDING NOTE   Subjective:   Patient continues to complain of right thigh and groin pain.   Hemodialysis treatment yesterday. Treatment through right femoral permcath. Had nausea and vomiting yesterday. Patient thinks this was due to her pain medications.   UF of 2.5 liters.   Objective:  Vital signs in last 24 hours:  Temp:  [97.3 F (36.3 C)-98.8 F (37.1 C)] 98.8 F (37.1 C) (08/13 0729) Pulse Rate:  [56-84] 74 (08/13 0729) Resp:  [14-18] 18 (08/13 0729) BP: (120-165)/(43-70) 152/50 (08/13 0729) SpO2:  [87 %-97 %] 92 % (08/13 0729)  Weight change:  Filed Weights   05/02/20 1728 05/03/20 0036  Weight: 82 kg 81.6 kg    Intake/Output: I/O last 3 completed shifts: In: 370.4 [P.O.:240; I.V.:130.4] Out: 2500 [Other:2500]   Intake/Output this shift:  No intake/output data recorded.  Physical Exam: General: NAD,   Head: Normocephalic, atraumatic. Moist oral mucosal membranes  Eyes: Anicteric, PERRL  Neck: Supple, trachea midline  Lungs:  Clear to auscultation  Heart: Regular rate and rhythm  Abdomen:  Soft, nontender,   Extremities:  Right thigh with edema and tenderness   Neurologic: Nonfocal, moving all four extremities  Skin: No lesions  Access: Right AVG maturing, Right femoral permcath with dried blood at exit site     Basic Metabolic Panel: Recent Labs  Lab 05/02/20 1751 05/02/20 2111 05/03/20 0413  NA 134*  --  135  K 5.6*  --  5.3*  CL 97*  --  102  CO2 23  --  22  GLUCOSE 115*  --  75  BUN 33*  --  36*  CREATININE 6.91* 7.18* 7.38*  CALCIUM 8.1*  --  7.6*    Liver Function Tests: Recent Labs  Lab 05/02/20 1751  AST 16  ALT 9  ALKPHOS 80  BILITOT 1.0  PROT 7.1  ALBUMIN 3.8   No results for input(s): LIPASE, AMYLASE in the last 168 hours. No results for input(s): AMMONIA in the last 168 hours.  CBC: Recent Labs  Lab 05/02/20 1751 05/02/20 2111 05/03/20 0413 05/03/20 1031  WBC 9.6 8.5 7.5 6.1   NEUTROABS 6.2  --   --   --   HGB 8.5* 7.8* 7.6* 8.2*  HCT 27.1* 24.6* 23.1* 24.1*  MCV 93.8 93.5 91.3 88.6  PLT 101* 96* 94* 92*    Cardiac Enzymes: No results for input(s): CKTOTAL, CKMB, CKMBINDEX, TROPONINI in the last 168 hours.  BNP: Invalid input(s): POCBNP  CBG: Recent Labs  Lab 05/03/20 1850 05/03/20 2055 05/04/20 0730 05/04/20 0802 05/04/20 0839  GLUCAP 78 117* 63* 60* 44    Microbiology: Results for orders placed or performed during the hospital encounter of 05/02/20  Blood culture (routine x 2)     Status: None (Preliminary result)   Collection Time: 05/02/20  5:44 PM   Specimen: BLOOD  Result Value Ref Range Status   Specimen Description BLOOD  Final   Special Requests NONE  Final   Culture   Final    NO GROWTH 2 DAYS Performed at [facility], [address], [place], [place] [address]    Report Status PENDING  Incomplete  SARS Coronavirus 2 by RT PCR (hospital order, performed in [facility] hospital lab) Nasopharyngeal Nasopharyngeal Swab     Status: None   Collection Time: 05/02/20  9:11 PM   Specimen: Nasopharyngeal Swab  Result Value Ref Range Status   SARS Coronavirus 2 NEGATIVE NEGATIVE Final  Comment: (NOTE) SARS-CoV-2 target nucleic acids are NOT DETECTED.  The SARS-CoV-2 RNA is generally detectable in upper and lower respiratory specimens during the acute phase of infection. The lowest concentration of SARS-CoV-2 viral copies this assay can detect is 250 copies / mL. A negative result does not preclude SARS-CoV-2 infection and should not be used as the sole basis for treatment or other patient management decisions.  A negative result may occur with improper specimen collection / handling, submission of specimen other than nasopharyngeal swab, presence of viral mutation(s) within the areas targeted by this assay, and inadequate number of viral copies (<250 copies / mL). A negative result must be combined with  clinical observations, patient history, and epidemiological information.  Fact Sheet for Patients:   StrictlyIdeas.no  Fact Sheet for Healthcare Providers: BankingDealers.co.za  This test is not yet approved or  cleared by the [place] FDA and has been authorized for detection and/or diagnosis of SARS-CoV-2 by FDA under an Emergency Use Authorization (EUA).  This EUA will remain in effect (meaning this test can be used) for the duration of the COVID-19 declaration under Section 564(b)(1) of the Act, 21 U.S.C. section 360bbb-3(b)(1), unless the authorization is terminated or revoked sooner.  Performed at [facility], [address] [place], [place] [address]   Culture, blood (single) w Reflex to ID Panel     Status: None (Preliminary result)   Collection Time: 05/03/20  8:31 AM   Specimen: BLOOD  Result Value Ref Range Status   Specimen Description BLOOD BLOOD LEFT HAND  Final   Special Requests   Final    BOTTLES DRAWN AEROBIC AND ANAEROBIC Blood Culture adequate volume   Culture   Final    NO GROWTH < 24 HOURS Performed at [facility], [address] [place], [place] [address]    Report Status PENDING  Incomplete  MRSA PCR Screening     Status: None   Collection Time: 05/03/20  8:55 AM   Specimen: Nasopharyngeal  Result Value Ref Range Status   MRSA by PCR NEGATIVE NEGATIVE Final    Comment:        The GeneXpert MRSA Assay (FDA approved for NASAL specimens only), is one component of a comprehensive MRSA colonization surveillance program. It is not intended to diagnose MRSA infection nor to guide or monitor treatment for MRSA infections. Performed at [facility], [address] [place], [place] [address]     Coagulation Studies: No results for input(s): LABPROT, INR in the last 72 hours.  Urinalysis: No results for input(s): COLORURINE, LABSPEC, PHURINE, GLUCOSEU, HGBUR,  BILIRUBINUR, KETONESUR, PROTEINUR, UROBILINOGEN, NITRITE, LEUKOCYTESUR in the last 72 hours.  Invalid input(s): APPERANCEUR    Imaging: CT Head Wo Contrast  Result Date: 05/02/2020 CLINICAL DATA:  Headache, classic migraine EXAM: CT HEAD WITHOUT CONTRAST TECHNIQUE: Contiguous axial images were obtained from the base of the skull through the vertex without intravenous contrast. COMPARISON:  10/04/2018 FINDINGS: Brain: No acute intracranial abnormality. Specifically, no hemorrhage, hydrocephalus, mass lesion, acute infarction, or significant intracranial injury. Vascular: No hyperdense vessel or unexpected calcification. Skull: No acute calvarial abnormality. Sinuses/Orbits: Visualized paranasal sinuses and mastoids clear. Orbital soft tissues unremarkable. Other: None IMPRESSION: No acute intracranial abnormality. Electronically Signed   By: Rolm Baptise M.D.   On: 05/02/2020 18:39     Medications:    . vitamin C  500 mg Oral Daily  . aspirin EC  81 mg Oral Daily  . atorvastatin  20 mg Oral QPM  .  Chlorhexidine Gluconate Cloth  6 each Topical Q0600  . cinacalcet  30 mg Oral Q supper  . epoetin (EPOGEN/PROCRIT) injection  10,000 Units Intravenous Q T,Th,Sa-HD  . ferrous sulfate  325 mg Oral Q breakfast  . gabapentin  100 mg Oral Daily  . hydrOXYzine  25 mg Oral Q T,Th,Sa-HD  . insulin aspart  0-9 Units Subcutaneous TID WC  . insulin detemir  30 Units Subcutaneous QHS  . lipase/protease/amylase  24,000 Units Oral With snacks  . lipase/protease/amylase  36,000 Units Oral TID with meals  . meclizine  12.5 mg Oral Q6H  . multivitamin  1 tablet Oral Daily  . omega-3 acid ethyl esters  1 g Oral BID  . pantoprazole  40 mg Oral Daily  . sevelamer carbonate  2,400 mg Oral TID with meals  . sevelamer carbonate  800 mg Oral With snacks  . cyanocobalamin  1,000 mcg Oral Daily  . zinc sulfate  220 mg Oral Daily   acetaminophen **OR** acetaminophen, albuterol, diphenhydrAMINE, dronabinol,  hydrOXYzine, hydrOXYzine, morphine injection, mupirocin ointment, nitroGLYCERIN, oxyCODONE-acetaminophen  Assessment/ Plan:  [name] is a 59 y.o. black female with end stage renal disease on hemodialysis for 14 years, diabetes mellitus type II, diabetic gastroparesis, pancreatic deficiency, hypertension, history of renal cancer, peripheral vascular disease, coronary artery disease, GERD, COPD who has been admitted on 05/02/2020 for Cellulitis of right leg [L03.115] Cellulitis of right lower extremity [L03.115]  [facility] left femoral permcath 81kg  1. End Stage Renal Disease: with complication of dialysis device.  Hero AVG that was placed by Dr. Delana Meyer on 7/23.  Hematoma and exit site bleeding of right femoral catheter.  - Dialysis for tomorrow. If possible, we will attempt to access AVG. If AVG is canulated without difficulty, will discuss removal of permcath with vascular surgery - Appreciate vascular input.   2. Hypertension: Home regimen of amlodipine, carvedilol, and hydralazine.    3. Anemia of chronic kidney disease: hemoglobin 8.2. COncern of bleeding at right femoral exit site.  - EPO with HD treatment  4. Secondary Hyperparathyroidism: outpatient labs 8/10 PTH 390, phos 4.4 and calcium 8.2.  - Continue sevelamer with meals.  - Continue cinacalcet.    LOS: 2 [name] 8/13/202111:30 AM

## 2020-05-04 NOTE — Progress Notes (Signed)
Patient's BS rechecked 73 and patient is eating breakfast and drinking apple juice. She is alert and oriented x4 and asymptotic.

## 2020-05-05 DIAGNOSIS — N186 End stage renal disease: Secondary | ICD-10-CM | POA: Diagnosis not present

## 2020-05-05 DIAGNOSIS — M79651 Pain in right thigh: Secondary | ICD-10-CM | POA: Diagnosis not present

## 2020-05-05 DIAGNOSIS — E1121 Type 2 diabetes mellitus with diabetic nephropathy: Secondary | ICD-10-CM | POA: Diagnosis not present

## 2020-05-05 DIAGNOSIS — T829XXD Unspecified complication of cardiac and vascular prosthetic device, implant and graft, subsequent encounter: Secondary | ICD-10-CM | POA: Diagnosis not present

## 2020-05-05 LAB — BASIC METABOLIC PANEL
Anion gap: 12 (ref 5–15)
BUN: 15 mg/dL (ref 6–20)
CO2: 28 mmol/L (ref 22–32)
Calcium: 8.3 mg/dL — ABNORMAL LOW (ref 8.9–10.3)
Chloride: 98 mmol/L (ref 98–111)
Creatinine, Ser: 3.65 mg/dL — ABNORMAL HIGH (ref 0.44–1.00)
GFR calc Af Amer: 15 mL/min — ABNORMAL LOW (ref >60)
GFR calc non Af Amer: 13 mL/min — ABNORMAL LOW (ref >60)
Glucose, Bld: 119 mg/dL — ABNORMAL HIGH (ref 70–99)
Potassium: 3.9 mmol/L (ref 3.5–5.1)
Sodium: 138 mmol/L (ref 135–145)

## 2020-05-05 LAB — CBC WITH DIFFERENTIAL/PLATELET
Abs Immature Granulocytes: 0.01 10*3/uL (ref 0.00–0.07)
Basophils Absolute: 0 10*3/uL (ref 0.0–0.1)
Basophils Relative: 0 %
Eosinophils Absolute: 0.7 10*3/uL — ABNORMAL HIGH (ref 0.0–0.5)
Eosinophils Relative: 15 %
HCT: 25.8 % — ABNORMAL LOW (ref 36.0–46.0)
Hemoglobin: 8.2 g/dL — ABNORMAL LOW (ref 12.0–15.0)
Immature Granulocytes: 0 %
Lymphocytes Relative: 18 %
Lymphs Abs: 0.8 10*3/uL (ref 0.7–4.0)
MCH: 30 pg (ref 26.0–34.0)
MCHC: 31.8 g/dL (ref 30.0–36.0)
MCV: 94.5 fL (ref 80.0–100.0)
Monocytes Absolute: 0.2 10*3/uL (ref 0.1–1.0)
Monocytes Relative: 4 %
Neutro Abs: 3 10*3/uL (ref 1.7–7.7)
Neutrophils Relative %: 63 %
Platelets: 126 10*3/uL — ABNORMAL LOW (ref 150–400)
RBC: 2.73 MIL/uL — ABNORMAL LOW (ref 3.87–5.11)
RDW: 15 % (ref 11.5–15.5)
WBC: 4.7 10*3/uL (ref 4.0–10.5)
nRBC: 0 % (ref 0.0–0.2)

## 2020-05-05 LAB — GLUCOSE, CAPILLARY
Glucose-Capillary: 76 mg/dL (ref 70–99)
Glucose-Capillary: 85 mg/dL (ref 70–99)
Glucose-Capillary: 106 mg/dL — ABNORMAL HIGH (ref 70–99)
Glucose-Capillary: 107 mg/dL — ABNORMAL HIGH (ref 70–99)

## 2020-05-05 MED ORDER — DIPHENHYDRAMINE HCL 25 MG PO CAPS
50.0000 mg | ORAL_CAPSULE | Freq: Four times a day (QID) | ORAL | Status: DC | PRN
  Administered 2020-05-06: 50 mg via ORAL
  Filled 2020-05-05 (×2): qty 2

## 2020-05-05 MED ORDER — CYPROHEPTADINE HCL 4 MG PO TABS
4.0000 mg | ORAL_TABLET | Freq: Three times a day (TID) | ORAL | Status: DC | PRN
  Filled 2020-05-05 (×2): qty 1

## 2020-05-05 NOTE — Progress Notes (Signed)
PROGRESS NOTE  [name] DXI:[id number] DOB: April 30, 1961 DOA: 05/02/2020 [facility]CP[facility]  Brief History   [name]  is a 59 y.o. African-American female with a known history of end-stage renal disease on hemodialysis, currently through her right thigh catheter, coronary artery disease, status post PCI and stent, chronic diastolic CHF, COPD and type 2 diabetes mellitus, who presented to the emergency room with acute onset of right thigh pain with associated swelling, mild redness, warmth and tenderness that started at 3 AM today.  She has been feeling cold but denies any fever.  No nausea or vomiting or abdominal pain.  No chest pain or dyspnea or cough or wheezing.  She had her hemodialysis today.  She was having a headache without paresthesias or focal muscle weakness.  Upon presentation to the emergency room, vital signs were within normal with a blood pressure 122/82 and later 74/50 with improvement with hydration to 112/60.  Labs revealed mild hyperkalemia 5.6 and mild hyponatremia hypochloremia, BUN of 33 and creatinine 6.91.  CBC showed hemoglobin of 7.8 and hematocrit of 24.6 compared to 8.5 and 27.1.  COVID-19 PCR is currently pending.  Noncontrasted head CT scan revealed no acute intracranial abnormalities.  The patient is a 59 year old female. On 04/13/20, the patient underwent with vascular surgery: 1. Creation ofrightarm prosthetic A-V graft, HERO graft 2. Placement of venous outflow, intravascular portion of HERO graftsame venous access 3. Introduction catheter into superior vena cava 4. Removal of existing permacath right external jugular vein with placement of a new tunneled catheter right common femoral vein 5. Ultrasound-guided placement of 8 French venous sheath,rightneck  She [facility] ED on 05/02/2020 with pain, swelling and oozing of dark material from the right femoral catheter site.  The patient was given IV Rocephin vancomycin and 250  mL IV normal saline, 12.5 mg of IV Phenergan and 50 mcg of IV fentanyl.  Given stabilization of her blood pressure, she will be admitted to a medically monitored bed for further evaluation and management. She was started on IV ceftriaxone and Vancomycin.  The patient has been evaluated by Dr. Juleen China and Dr. Lucky Cowboy for vascular surgery. She has had no fevers, and no elevation of WBC. It is felt that the swelling and induration to the right thigh is due to bleeding at the HD catheter site and infiltration from the catheter. Antibiotics have been discontinued.  The patient has a right upper extremity graft that was placed about 3 weeks ago. The patient had HD yesterday through the femoral catheter. It clotted off and she was unable to complete dialysis. They plan to dialyze tomorrow through the AVG tomorrow. If this is successful, they will discuss removal of the permcath with vascular surgery.   Consultants  . Nephrology . Vascular surgery  Procedures  . Hemodialysis  Antibiotics   Anti-infectives (From admission, onward)   Start     Dose/Rate Route Frequency Ordered Stop   05/03/20 2100  cefTRIAXone (ROCEPHIN) 1 g in sodium chloride 0.9 % 100 mL IVPB  Status:  Discontinued        1 g 200 mL/hr over 30 Minutes Intravenous Every 24 hours 05/02/20 2056 05/03/20 1122   05/03/20 1000  vancomycin (VANCOCIN) IVPB 1000 mg/200 mL premix  Status:  Discontinued        1,000 mg 200 mL/hr over 60 Minutes Intravenous  Once 05/03/20 0828 05/03/20 1122   05/02/20 2045  cefTRIAXone (ROCEPHIN) 1 g in sodium chloride 0.9 % 100 mL  IVPB        1 g 200 mL/hr over 30 Minutes Intravenous  Once 05/02/20 2033 05/02/20 2207   05/02/20 2045  vancomycin (VANCOCIN) IVPB 1000 mg/200 mL premix        1,000 mg 200 mL/hr over 60 Minutes Intravenous  Once 05/02/20 2033 05/02/20 2357     Subjective  The patient is resting quietly. She expresses frustration over lack of progress on solving her catheter issues.  Objective    Vitals:  Vitals:   05/04/20 2345 05/05/20 0840  BP: (!) 153/59 (!) 167/64  Pulse: 64 71  Resp: 17 16  Temp: 98.2 F (36.8 C) 97.8 F (36.6 C)  SpO2: 96% 92%    I have personally reviewed the following:   Today's Data  . Vitals, BMP, CBC  Micro Data  . Blood cultures x 1 - no growth.  Cardiology Data  . EKG - NSR  Scheduled Meds: . vitamin C  500 mg Oral Daily  . aspirin EC  81 mg Oral Daily  . atorvastatin  20 mg Oral QPM  . Chlorhexidine Gluconate Cloth  6 each Topical Q0600  . cinacalcet  30 mg Oral Q supper  . epoetin (EPOGEN/PROCRIT) injection  10,000 Units Intravenous Q T,Th,Sa-HD  . ferrous sulfate  325 mg Oral Q breakfast  . gabapentin  100 mg Oral Daily  . hydrOXYzine  25 mg Oral Q T,Th,Sa-HD  . insulin aspart  0-9 Units Subcutaneous TID WC  . lipase/protease/amylase  24,000 Units Oral With snacks  . lipase/protease/amylase  36,000 Units Oral TID with meals  . meclizine  12.5 mg Oral Q6H  . multivitamin  1 tablet Oral Daily  . omega-3 acid ethyl esters  1 g Oral BID  . pantoprazole  40 mg Oral Daily  . sevelamer carbonate  2,400 mg Oral TID with meals  . sevelamer carbonate  800 mg Oral With snacks  . cyanocobalamin  1,000 mcg Oral Daily  . zinc sulfate  220 mg Oral Daily    Active Problems:   Cellulitis of lower extremity   Cellulitis of right leg   LOS: 3 days   A & P  Malfunction of right femoral HD catheter: With bleeding around site and likely infiltration of the tissues in the thigh. The patient has tenderness and erythema in this area, but no elevation of white count or fever. I have discussed this with Dr. Juleen China. He feels as I do that the evidence supports catheter malfunction with bleeding and infiltration far better than infection. PCR for MRSA is negative. Ceftriaxone and Vancomycin have been discontinued. Blood culture taken on 05/02/2020 has had no growth. HD attempted yesterday via the permcath. It clotted off twice and she was unable to  complete her run. I have discussed the patient with Dr. Juleen China.  The plan is to attempt HD through AVG tomorrow. If successful, plans are to discuss removal of permcath with vascular surgery. Awaiting decision on how to proceed by nephrology or vascular surgery.  End-stage renal disease on hemodialysis with current hyperkalemia: Nephrology is following. HD for today. PhosLo and Sensipar have been continued along with Vitamin D3. Brief hypotension with history of hypertension: Noted. I have discussed this with Dr. Juleen China who knows the patient. This is not unusual for her. Aamlodipine and Coreg have been held. I will restart with parameters.  Dyslipidemia: Continue statin therapy.  Type 2 diabetes mellitus with diabetic neuropathy: The patient will be placed on supplement coverage with NovoLog. We will  continue basal coverage.  GERD: Continues Protonix.  I have seen and examined this patient myself. I have spent 32 minutes in her evaluation and care.  DVT prophylaxis: Heparin sub Q CODE STATUS: Full Code Family Communication: None available Disposition: Status is: Inpatient  Remains inpatient appropriate because:IV treatments appropriate due to intensity of illness or inability to take PO   Dispo: The patient is from: Home              Anticipated d/c is to: Home              Anticipated d/c date is: 2 days              Patient currently is not medically stable to d/c.  Robin Koch, DO [facility] Direct contact: see [email or web address]  7PM-7AM contact night coverage as above 05/05/2020, 12:59 PM  LOS: 1 day

## 2020-05-05 NOTE — Progress Notes (Signed)
[name]  MRN: [id number]  DOB/AGE: [birth year] 59 y.o.  Primary Care Physician:[facility] date: 05/02/2020  Chief Complaint:  Chief Complaint  Patient presents with  . Vascular Access Problem    S-Pt presented on  05/02/2020 with  Chief Complaint  Patient presents with  . Vascular Access Problem  . Patient main complaint in today visit was his right thigh pain.  Patient then went on to explain that how she has had pain in her right thigh since she had the catheter placed.  Medications   . vitamin C  500 mg Oral Daily  . aspirin EC  81 mg Oral Daily  . atorvastatin  20 mg Oral QPM  . Chlorhexidine Gluconate Cloth  6 each Topical Q0600  . cinacalcet  30 mg Oral Q supper  . epoetin (EPOGEN/PROCRIT) injection  10,000 Units Intravenous Q T,Th,Sa-HD  . ferrous sulfate  325 mg Oral Q breakfast  . gabapentin  100 mg Oral Daily  . hydrOXYzine  25 mg Oral Q T,Th,Sa-HD  . insulin aspart  0-9 Units Subcutaneous TID WC  . lipase/protease/amylase  24,000 Units Oral With snacks  . lipase/protease/amylase  36,000 Units Oral TID with meals  . meclizine  12.5 mg Oral Q6H  . multivitamin  1 tablet Oral Daily  . omega-3 acid ethyl esters  1 g Oral BID  . pantoprazole  40 mg Oral Daily  . sevelamer carbonate  2,400 mg Oral TID with meals  . sevelamer carbonate  800 mg Oral With snacks  . cyanocobalamin  1,000 mcg Oral Daily  . zinc sulfate  220 mg Oral Daily         NIO:EVOJJ from the symptoms mentioned above,there are no other symptoms referable to all systems reviewed.  Physical Exam: Vital signs in last 24 hours: Temp:  [98.2 F (36.8 C)] 98.2 F (36.8 C) (08/13 2345) Pulse Rate:  [64-70] 64 (08/13 2345) Resp:  [17-18] 17 (08/13 2345) BP: (153-162)/(59-60) 153/59 (08/13 2345) SpO2:  [96 %-99 %] 96 % (08/13 2345) Weight change:  Last BM Date: 05/02/20  Intake/Output from previous day: No intake/output data recorded. No intake/output  data recorded.   Physical Exam: General- pt is awake,alert, oriented to time place and person Resp- No acute REsp distress, CTA B/L NO Rhonchi CVS- S1S2 regular in rate and rhythm GIT- BS+, soft, NT, ND EXT- NO LE Edema, no cyanosis Left upper extremity minimal edema  Access patient has femoral catheter on the right side as well as hero graft  Addendum patient femoral catheter being used today  Lab Results: CBC Recent Labs    05/03/20 0413 05/03/20 1031  WBC 7.5 6.1  HGB 7.6* 8.2*  HCT 23.1* 24.1*  PLT 94* 92*    BMET Recent Labs    05/02/20 1751 05/02/20 1751 05/02/20 2111 05/03/20 0413  NA 134*  --   --  135  K 5.6*  --   --  5.3*  CL 97*  --   --  102  CO2 23  --   --  22  GLUCOSE 115*  --   --  75  BUN 33*  --   --  36*  CREATININE 6.91*   < > 7.18* 7.38*  CALCIUM 8.1*  --   --  7.6*   < > = values in this interval not displayed.    MICRO Recent Results (from the past 240 hour(s))  Blood culture (routine x 2)  Status: None (Preliminary result)   Collection Time: 05/02/20  5:44 PM   Specimen: BLOOD  Result Value Ref Range Status   Specimen Description BLOOD  Final   Special Requests NONE  Final   Culture   Final    NO GROWTH 3 DAYS Performed at [facility], [address] [place], [place] [address]    Report Status PENDING  Incomplete  SARS Coronavirus 2 by RT PCR (hospital order, performed in [facility] hospital lab) Nasopharyngeal Nasopharyngeal Swab     Status: None   Collection Time: 05/02/20  9:11 PM   Specimen: Nasopharyngeal Swab  Result Value Ref Range Status   SARS Coronavirus 2 NEGATIVE NEGATIVE Final    Comment: (NOTE) SARS-CoV-2 target nucleic acids are NOT DETECTED.  The SARS-CoV-2 RNA is generally detectable in upper and lower respiratory specimens during the acute phase of infection. The lowest concentration of SARS-CoV-2 viral copies this assay can detect is 250 copies / mL. A negative result does not preclude  SARS-CoV-2 infection and should not be used as the sole basis for treatment or other patient management decisions.  A negative result may occur with improper specimen collection / handling, submission of specimen other than nasopharyngeal swab, presence of viral mutation(s) within the areas targeted by this assay, and inadequate number of viral copies (<250 copies / mL). A negative result must be combined with clinical observations, patient history, and epidemiological information.  Fact Sheet for Patients:   StrictlyIdeas.no  Fact Sheet for Healthcare Providers: BankingDealers.co.za  This test is not yet approved or  cleared by the [place] FDA and has been authorized for detection and/or diagnosis of SARS-CoV-2 by FDA under an Emergency Use Authorization (EUA).  This EUA will remain in effect (meaning this test can be used) for the duration of the COVID-19 declaration under Section 564(b)(1) of the Act, 21 U.S.C. section 360bbb-3(b)(1), unless the authorization is terminated or revoked sooner.  Performed at [facility], [address] [place], [place] [address]   Culture, blood (single) w Reflex to ID Panel     Status: None (Preliminary result)   Collection Time: 05/03/20  8:31 AM   Specimen: BLOOD  Result Value Ref Range Status   Specimen Description BLOOD BLOOD LEFT HAND  Final   Special Requests   Final    BOTTLES DRAWN AEROBIC AND ANAEROBIC Blood Culture adequate volume   Culture   Final    NO GROWTH 2 DAYS Performed at [facility], [address], [place], [place] [address]    Report Status PENDING  Incomplete  MRSA PCR Screening     Status: None   Collection Time: 05/03/20  8:55 AM   Specimen: Nasopharyngeal  Result Value Ref Range Status   MRSA by PCR NEGATIVE NEGATIVE Final    Comment:        The GeneXpert MRSA Assay (FDA approved for NASAL specimens only), is one component of  a comprehensive MRSA colonization surveillance program. It is not intended to diagnose MRSA infection nor to guide or monitor treatment for MRSA infections. Performed at [facility], [address], [place], [place] [address]       Lab Results  Component Value Date   PTH 357 (H) 10/05/2018   CALCIUM 7.6 (L) 05/03/2020   CAION 1.09 (L) 04/13/2020   PHOS 1.9 (L) 11/30/2019               Impression: Patient is 59 year old African-American female with a past medical  history of end-stage renal disease on hemodialysis, diabetes mellitus type 2, diabetic gastroparesis, hypertension, pancreatic deficiency, history of renal cancer, peripheral vascular disease, CAD, GERD, COPD who was admitted on August 11 with chief complaint of cellulitis of right leg  1)Renal end-stage renal disease. Patient is on hemodialysis on Tuesday Thursday Saturday schedule . As an outpatient at [facility] located at [facility] under [facility] care. Patient will be dialyzed today  Addendum Patient is currently being dialyzed Patient is tolerating treatment well  2)HTN Patient blood pressure is at goal Patient is on amlodipine, Coreg and hydralazine   3)Anemia of chronic disease  HGb is not at goal (9--11) We will keep patient on Epogen  4) secondary hyperparathyroidism -CKD Mineral-Bone Disorder   Secondary Hyperparathyroidism present Patient is on [place]  Patient is on binders We will recheck phosphorus   5) malfunction of dialysis device Patient had hematoma/bleeding from her right femoral catheter Patient does have hero graft which was placed on July 23   Addendum Plan today was an attempt to use patient's recently placed hero graft But because of staffing factors we were not able to access his hero graft today We will try to access/resolve staffing issues by patient's next dialysis treatment if patient is still inpatient  6) electrolytes   sodium Hyponatremia Now  better   potassium Hyperkalemia We will use 2K bath    7)Acid base Co2 at goal     Plan:   Will use 2K bath We will keep patient on Epogen-10 K with dialysis  Addendum Patient being dialyzed today Patient tolerating treatment well We were not able to use patient hero graft secondary to staffing factors   [name] s Theador Hawthorne 05/05/2020, 8:36 AM

## 2020-05-06 DIAGNOSIS — K219 Gastro-esophageal reflux disease without esophagitis: Secondary | ICD-10-CM | POA: Diagnosis not present

## 2020-05-06 DIAGNOSIS — T8243XD Leakage of vascular dialysis catheter, subsequent encounter: Secondary | ICD-10-CM

## 2020-05-06 DIAGNOSIS — E118 Type 2 diabetes mellitus with unspecified complications: Secondary | ICD-10-CM | POA: Diagnosis not present

## 2020-05-06 DIAGNOSIS — N186 End stage renal disease: Secondary | ICD-10-CM | POA: Diagnosis not present

## 2020-05-06 LAB — GLUCOSE, CAPILLARY
Glucose-Capillary: 69 mg/dL — ABNORMAL LOW (ref 70–99)
Glucose-Capillary: 66 mg/dL — ABNORMAL LOW (ref 70–99)
Glucose-Capillary: 83 mg/dL (ref 70–99)

## 2020-05-06 NOTE — Progress Notes (Signed)
Discharge note: Reviewed d/c instructions with pt. Pt verbalized understanding. Removed Iv Cath on Left forearm. IV cath intact noted. Hemodialysis cath on right groin intact noted, Pt wheeled out by staff, Transported to home via private vehicle.

## 2020-05-06 NOTE — Progress Notes (Signed)
Notified MD regarding elevating BP prior discharging from facility.  Per MD, Pt okay to d/c.   05/06/20 1343  Vitals  Temp 98.1 F (36.7 C)  Temp Source Oral  BP (!) 178/68  MAP (mmHg) 99  BP Location Left Arm  BP Method Automatic  Patient Position (if appropriate) Sitting  Pulse Rate 77  Pulse Rate Source Monitor  Resp 16  MEWS COLOR  MEWS Score Color Green  Oxygen Therapy  SpO2 93 %  O2 Device Room Air  MEWS Score  MEWS Temp 0  MEWS Systolic 0  MEWS Pulse 0  MEWS RR 0  MEWS LOC 0  MEWS Score 0

## 2020-05-06 NOTE — Discharge Summary (Signed)
Physician Discharge Summary  BULA CAVALIERI GYJ:[id number] DOB: Sep 30, 1960 DOA: 05/02/2020  PCP: [facility] [place] date: 05/02/2020 Discharge date: 05/06/2020  Recommendations for Outpatient Follow-up:  1. Keep dialysis appointment for Tuesday 04/07/2020. 2. Follow up with vascular surgery as directed. 3. Follow up with PCP in 7-10 days  Discharge Diagnoses: Principal diagnosis is #1 1. Dialysis catheter malfunction 2. Bleeding from dialysis catheter 3. ESRD on HD 4. DM II 5. GERD 6. Anemia  Discharge Condition: Fair  Disposition: Home  Diet recommendation: heart healthy/modified carbohydrate  Filed Weights   05/02/20 1728 05/03/20 0036  Weight: 82 kg 81.6 kg    History of present illness:  [name]  is a 59 y.o. African-American female with a known history of end-stage renal disease on hemodialysis, currently through her right thigh catheter, coronary artery disease, status post PCI and stent, chronic diastolic CHF, COPD and type 2 diabetes mellitus, who presented to the emergency room with acute onset of right thigh pain with associated swelling, mild redness, warmth and tenderness that started at 3 AM today.  She has been feeling cold but denies any fever.  No nausea or vomiting or abdominal pain.  No chest pain or dyspnea or cough or wheezing.  She had her hemodialysis today.  She was having a headache without paresthesias or focal muscle weakness.  Upon presentation to the emergency room, vital signs were within normal with a blood pressure 122/82 and later 74/50 with improvement with hydration to 112/60.  Labs revealed mild hyperkalemia 5.6 and mild hyponatremia hypochloremia, BUN of 33 and creatinine 6.91.  CBC showed hemoglobin of 7.8 and hematocrit of 24.6 compared to 8.5 and 27.1.  COVID-19 PCR is currently pending.  Noncontrasted head CT scan revealed no acute intracranial abnormalities.  The patient was given IV Rocephin vancomycin and 250 mL IV  normal saline, 12.5 mg of IV Phenergan and 50 mcg of IV fentanyl.  Given stabilization of her blood pressure, she will be admitted to a medically monitored bed for further evaluation and management.  Hospital Course:  The patient has been evaluated by Dr. Juleen China and Dr. Lucky Cowboy for vascular surgery. She has had no fevers, and no elevation of WBC. It is felt that the swelling and induration to the right thigh is due to bleeding at the HD catheter site and infiltration from the catheter. Antibiotics have been discontinued.  The patient has a right upper extremity graft that was placed about 3 weeks ago. The patient had HD yesterday through the femoral catheter. It clotted off and she was unable to complete dialysis. They plan to dialyze tomorrow through the AVG tomorrow. If this is successful, they will discuss removal of the permcath with vascular surgery.   Unfortunately, during her stay, there was not the appropriate dialysis nursing staff to access the patient's right upper extremity HERO graft. She was dialyzed twice from her femoral temporary graft. Gradually the patient's pain and swelling in this extremity resolved. On the recommendation of Dr. Theador Hawthorne, the patient will be discharged to home and will go for dialysis on her regular schedule on Tuesday. Dr. Theador Hawthorne feels that there will be someone there who can access her HERO graft. Then he will make arrangements with vascular surgery to remove the temporary catheter from her right thigh.  Today's assessment: S: The patient is sitting up in bed. She is itching on her back. O: Vitals:  Vitals:   05/06/20 0754 05/06/20 1343  BP: (!) 160/63 (!) 178/68  Pulse: 75 77  Resp: 18 16  Temp: 98.4 F (36.9 C) 98.1 F (36.7 C)  SpO2: 94% 93%   Malfunction of right femoral HD catheter: With bleeding around site and likely infiltration of the tissues in the thigh. The patient has tenderness and erythema in this area, but no elevation of white count or  fever. I have discussed this with Dr. Juleen China. He feels as I do that the evidence supports catheter malfunction with bleeding and infiltration far better than infection. PCR for MRSA is negative. Ceftriaxone and Vancomycin have been discontinued. Blood culture taken on 05/02/2020 has had no growth. HD attempted yesterday via the permcath. It clotted off twice and she was unable to complete her run. I have discussed the patient with Dr. Juleen China.  The plan is to attempt HD through AVG tomorrow. If successful, plans are to discuss removal of permcath with vascular surgery. Awaiting decision on how to proceed by nephrology or vascular surgery.  End-stage renal disease on hemodialysiswith current hyperkalemia: Nephrology is following. HD for today. PhosLo and Sensipar have been continued along with Vitamin D3. Brief hypotension with history of hypertension: Noted. I have discussed this with Dr. Juleen China who knows the patient. This is not unusual for her. Aamlodipine and Coreg have been held. I will restart with parameters.  Dyslipidemia: Continue statin therapy.  Type 2 diabetes mellitus with diabetic neuropathy: The patient will be placed on supplement coverage with NovoLog. We will continue basal coverage.  GERD: Continues Protonix.  I have seen and examined this patient myself. I have spent 32 minutes in her evaluation and care.  DVT prophylaxis: Heparin sub Q CODE STATUS: Full Code Family Communication: None available Disposition: Status is: Inpatient Discharge Instructions  Discharge Instructions    Activity as tolerated - No restrictions   Complete by: As directed    Call MD for:  redness, tenderness, or signs of infection (pain, swelling, redness, odor or green/yellow discharge around incision site)   Complete by: As directed    Call MD for:  severe uncontrolled pain   Complete by: As directed    Call MD for:  temperature >100.4   Complete by: As directed    Diet - low sodium  heart healthy   Complete by: As directed    Discharge instructions   Complete by: As directed    Keep dialysis appointment for Tuesday 04/07/2020. Follow up with vascular surgery as directed. Follow up with PCP in 7-10 days   Increase activity slowly   Complete by: As directed    No wound care   Complete by: As directed      Allergies as of 05/06/2020      Reactions   Ace Inhibitors Swelling, Anaphylaxis   Ativan [lorazepam] Other (See Comments)   Reaction:Hallucinations and headaches   Compazine [prochlorperazine Edisylate] Anaphylaxis, Nausea And Vomiting, Other (See Comments)   Other reaction(s): dystonia from this vs. Reglan, 23 Jul - patient relates that she takes promethazine frequently with no problems   Sumatriptan Succinate Other (See Comments)   Other reaction(s): delirium and hallucinations per [facility] records   Zofran [ondansetron] Nausea And Vomiting   Per pt. she is allergic to zofran or will experience adverse reaction like hallucination    Lac Bovis Nausea And Vomiting   Losartan Nausea Only   Prochlorperazine Other (See Comments)   Reaction:  Unknown . Patient does not remember reaction but she does have vertigo and anxiety along with n and v at times. Could be used  to treat any of these   Reglan [metoclopramide] Other (See Comments)   Per patient her Dr. Evelina Bucy her off it    Scopolamine Other (See Comments)   Dizziness, also has vertigo already   Tape Rash   Plastic tape causes rash   Tapentadol Rash      Medication List    STOP taking these medications   oxyCODONE-acetaminophen 5-325 MG tablet Commonly known as: Percocet   QC TUMERIC COMPLEX PO     TAKE these medications   albuterol (2.5 MG/3ML) 0.083% nebulizer solution Commonly known as: PROVENTIL Take 2.5 mg by nebulization every 4 (four) hours as needed for wheezing or shortness of breath.   albuterol 108 (90 Base) MCG/ACT inhaler Commonly known as: VENTOLIN HFA Inhale 2 puffs into the lungs  every 4 (four) hours as needed for wheezing or shortness of breath.   amLODipine 10 MG tablet Commonly known as: NORVASC Take 10 mg by mouth daily.   ammonium lactate 12 % lotion Commonly known as: LAC-HYDRIN Apply 1 application topically in the morning and at bedtime.   aspirin EC 81 MG tablet Take 81 mg by mouth daily.   atorvastatin 20 MG tablet Commonly known as: LIPITOR Take 20 mg by mouth every evening.   calcium acetate 667 MG capsule Commonly known as: PHOSLO TAKE 3 CAPSULES BY MOUTH 3 TIMES A DAY BEFORE MEALS AND 1 WITH SNACKS   carvedilol 6.25 MG tablet Commonly known as: COREG Take 6.25 mg by mouth 2 (two) times daily.   cinacalcet 30 MG tablet Commonly known as: SENSIPAR Take 30 mg by mouth daily.   citalopram 20 MG tablet Commonly known as: CELEXA Take 20 mg by mouth daily.   clopidogrel 75 MG tablet Commonly known as: PLAVIX Take 75 mg by mouth at bedtime.   Combivent Respimat 20-100 MCG/ACT Aers respimat Generic drug: Ipratropium-Albuterol Inhale 2 puffs into the lungs every 6 (six) hours as needed for wheezing or shortness of breath. Shortness of breath   Creon 12000-38000 units Cpep capsule Generic drug: lipase/protease/amylase Take 12,000 Units by mouth See admin instructions. Take 36,000 with meals and 24,000 with snacks three times a day   cyanocobalamin 1000 MCG tablet Take 1,000 mcg by mouth daily.   diclofenac sodium 1 % Gel Commonly known as: VOLTAREN Apply 1 application topically in the morning and at bedtime.   diphenhydrAMINE 25 MG tablet Commonly known as: BENADRYL Take 25 mg by mouth every 6 (six) hours as needed for itching.   gabapentin 100 MG capsule Commonly known as: NEURONTIN Take 100 mg by mouth 2 (two) times daily.   HYDROcodone-acetaminophen 5-325 MG tablet Commonly known as: NORCO/VICODIN Take 1 tablet by mouth 4 (four) times daily as needed for moderate pain.   hydrOXYzine 25 MG tablet Commonly known as:  ATARAX/VISTARIL Take 25 mg by mouth See admin instructions. Take on Tues, Thurs, and sat after dialysis Monday Wednesday Friday and Sunday daily in the morning   insulin aspart 100 UNIT/ML injection Commonly known as: novoLOG Inject 4-15 Units into the skin in the morning, at noon, in the evening, and at bedtime. Sliding scale   Iron 325 (65 Fe) MG Tabs Take 325 mg by mouth daily.   Levemir 100 UNIT/ML injection Generic drug: insulin detemir Inject 67 Units into the skin at bedtime.   meclizine 25 MG tablet Commonly known as: ANTIVERT Take 12.5 mg by mouth every 6 (six) hours.   multivitamin Tabs tablet Take 1 tablet by mouth daily.  mupirocin ointment 2 % Commonly known as: BACTROBAN Apply 1 application topically daily as needed (leg rash).   nitroGLYCERIN 0.4 MG SL tablet Commonly known as: NITROSTAT Place 0.4 mg under the tongue every 5 (five) minutes as needed for chest pain.   Omega-3 300 MG Caps Take 300 mg by mouth 2 (two) times daily. Every other day opposite omega XL   pantoprazole 40 MG tablet Commonly known as: PROTONIX Take 40 mg by mouth daily.   PROBIOTIC PO Take 1 capsule by mouth daily.   promethazine 12.5 MG tablet Commonly known as: PHENERGAN Take 1 tablet (12.5 mg total) by mouth every 8 (eight) hours as needed for nausea.   sevelamer carbonate 800 MG tablet Commonly known as: RENVELA Take 800-2,400 mg by mouth See admin instructions. Take 2400 mg by mouth 3 times daily with meals and take [phone] mg by mouth with snacks   VITA-C PO Take 1 tablet by mouth daily.   Vitamin D3 25 MCG (1000 UT) Caps Take 1,000 Units by mouth daily.   Zinc 50 MG Caps Take 50 mg by mouth daily.      Allergies  Allergen Reactions  . Ace Inhibitors Swelling and Anaphylaxis  . Ativan [Lorazepam] Other (See Comments)    Reaction:Hallucinations and headaches  . Compazine [Prochlorperazine Edisylate] Anaphylaxis, Nausea And Vomiting and Other (See Comments)     Other reaction(s): dystonia from this vs. Reglan, 23 Jul - patient relates that she takes promethazine frequently with no problems  . Sumatriptan Succinate Other (See Comments)    Other reaction(s): delirium and hallucinations per [facility] records  . Zofran [Ondansetron] Nausea And Vomiting    Per pt. she is allergic to zofran or will experience adverse reaction like hallucination   . Lac Bovis Nausea And Vomiting  . Losartan Nausea Only  . Prochlorperazine Other (See Comments)    Reaction:  Unknown . Patient does not remember reaction but she does have vertigo and anxiety along with n and v at times. Could be used to treat any of these   . Reglan [Metoclopramide] Other (See Comments)    Per patient her Dr. Evelina Bucy her off it   . Scopolamine Other (See Comments)    Dizziness, also has vertigo already  . Tape Rash    Plastic tape causes rash  . Tapentadol Rash    The results of significant diagnostics from this hospitalization (including imaging, microbiology, ancillary and laboratory) are listed below for reference.    Significant Diagnostic Studies: CT Head Wo Contrast  Result Date: 05/02/2020 CLINICAL DATA:  Headache, classic migraine EXAM: CT HEAD WITHOUT CONTRAST TECHNIQUE: Contiguous axial images were obtained from the base of the skull through the vertex without intravenous contrast. COMPARISON:  10/04/2018 FINDINGS: Brain: No acute intracranial abnormality. Specifically, no hemorrhage, hydrocephalus, mass lesion, acute infarction, or significant intracranial injury. Vascular: No hyperdense vessel or unexpected calcification. Skull: No acute calvarial abnormality. Sinuses/Orbits: Visualized paranasal sinuses and mastoids clear. Orbital soft tissues unremarkable. Other: None IMPRESSION: No acute intracranial abnormality. Electronically Signed   By: Rolm Baptise M.D.   On: 05/02/2020 18:39   DG C-Arm 1-60 Min-No Report  Result Date: 04/13/2020 Fluoroscopy was utilized by the requesting  physician.  No radiographic interpretation.   DG C-Arm 1-60 Min-No Report  Result Date: 04/13/2020 Fluoroscopy was utilized by the requesting physician.  No radiographic interpretation.    Microbiology: Recent Results (from the past 240 hour(s))  Blood culture (routine x 2)     Status: None (Preliminary result)  Collection Time: 05/02/20  5:44 PM   Specimen: BLOOD  Result Value Ref Range Status   Specimen Description BLOOD  Final   Special Requests NONE  Final   Culture   Final    NO GROWTH 4 DAYS Performed at [facility], [address], [place], [place] [address]    Report Status PENDING  Incomplete  SARS Coronavirus 2 by RT PCR (hospital order, performed in [facility] hospital lab) Nasopharyngeal Nasopharyngeal Swab     Status: None   Collection Time: 05/02/20  9:11 PM   Specimen: Nasopharyngeal Swab  Result Value Ref Range Status   SARS Coronavirus 2 NEGATIVE NEGATIVE Final    Comment: (NOTE) SARS-CoV-2 target nucleic acids are NOT DETECTED.  The SARS-CoV-2 RNA is generally detectable in upper and lower respiratory specimens during the acute phase of infection. The lowest concentration of SARS-CoV-2 viral copies this assay can detect is 250 copies / mL. A negative result does not preclude SARS-CoV-2 infection and should not be used as the sole basis for treatment or other patient management decisions.  A negative result may occur with improper specimen collection / handling, submission of specimen other than nasopharyngeal swab, presence of viral mutation(s) within the areas targeted by this assay, and inadequate number of viral copies (<250 copies / mL). A negative result must be combined with clinical observations, patient history, and epidemiological information.  Fact Sheet for Patients:   StrictlyIdeas.no  Fact Sheet for Healthcare Providers: BankingDealers.co.za  This test is not yet approved or   cleared by the [place] FDA and has been authorized for detection and/or diagnosis of SARS-CoV-2 by FDA under an Emergency Use Authorization (EUA).  This EUA will remain in effect (meaning this test can be used) for the duration of the COVID-19 declaration under Section 564(b)(1) of the Act, 21 U.S.C. section 360bbb-3(b)(1), unless the authorization is terminated or revoked sooner.  Performed at [facility], [address] [place], [place] [address]   Culture, blood (single) w Reflex to ID Panel     Status: None (Preliminary result)   Collection Time: 05/03/20  8:31 AM   Specimen: BLOOD  Result Value Ref Range Status   Specimen Description BLOOD BLOOD LEFT HAND  Final   Special Requests   Final    BOTTLES DRAWN AEROBIC AND ANAEROBIC Blood Culture adequate volume   Culture   Final    NO GROWTH 3 DAYS Performed at [facility], [address], [place], [place] [address]    Report Status PENDING  Incomplete  MRSA PCR Screening     Status: None   Collection Time: 05/03/20  8:55 AM   Specimen: Nasopharyngeal  Result Value Ref Range Status   MRSA by PCR NEGATIVE NEGATIVE Final    Comment:        The GeneXpert MRSA Assay (FDA approved for NASAL specimens only), is one component of a comprehensive MRSA colonization surveillance program. It is not intended to diagnose MRSA infection nor to guide or monitor treatment for MRSA infections. Performed at [facility], [address] [place], [place] [address]      Labs: Basic Metabolic Panel: Recent Labs  Lab 05/02/20 1751 05/02/20 2111 05/03/20 0413 05/05/20 1212  NA 134*  --  135 138  K 5.6*  --  5.3* 3.9  CL 97*  --  102 98  CO2 23  --  22 28  GLUCOSE 115*  --  75 119*  BUN 33*  --  36* 15  CREATININE 6.91* 7.18* 7.38* 3.65*  CALCIUM 8.1*  --  7.6* 8.3*   Liver Function Tests: Recent Labs  Lab 05/02/20 1751  AST 16  ALT 9  ALKPHOS 80  BILITOT 1.0  PROT 7.1  ALBUMIN 3.8    No results for input(s): LIPASE, AMYLASE in the last 168 hours. No results for input(s): AMMONIA in the last 168 hours. CBC: Recent Labs  Lab 05/02/20 1751 05/02/20 2111 05/03/20 0413 05/03/20 1031 05/05/20 1212  WBC 9.6 8.5 7.5 6.1 4.7  NEUTROABS 6.2  --   --   --  3.0  HGB 8.5* 7.8* 7.6* 8.2* 8.2*  HCT 27.1* 24.6* 23.1* 24.1* 25.8*  MCV 93.8 93.5 91.3 88.6 94.5  PLT 101* 96* 94* 92* 126*   Cardiac Enzymes: No results for input(s): CKTOTAL, CKMB, CKMBINDEX, TROPONINI in the last 168 hours. BNP: BNP (last 3 results) No results for input(s): BNP in the last 8760 hours.  ProBNP (last 3 results) No results for input(s): PROBNP in the last 8760 hours.  CBG: Recent Labs  Lab 05/05/20 1745 05/05/20 2108 05/06/20 0758 05/06/20 0910 05/06/20 1157  GLUCAP 106* 107* 69* 66* 83    Active Problems:   Cellulitis of lower extremity   Cellulitis of right leg   Time coordinating discharge: 38 minutes.  Signed:        Mikiya Nebergall, DO [facility]  05/06/2020, 7:07 PM

## 2020-05-06 NOTE — Progress Notes (Signed)
[name]  MRN: [id number]  DOB/AGE: 1961/07/01 59 y.o.  Primary Care Physician:[facility] date: 05/02/2020  Chief Complaint:  Chief Complaint  Patient presents with  . Vascular Access Problem    S-Pt presented on  05/02/2020 with  Chief Complaint  Patient presents with  . Vascular Access Problem  . Patient had multiple comments in today's visit.  Patient initially went on to give the history that how her left upper extremity graft was mishandled and later the graft stopped working.  Next patient then had few comments about how she has not been been up and about while she has been in the hospital.  Patient later said that her bleeding from her catheter is stopped, her pain is now better and she wishes to go home.  I did discuss with the patient the reason why we could not access her hero graft yesterday as an inpatient.  I explained to the patient about staffing concerns/experienced nurses lack of availability yesterday.  I also discussed with the patient that in case she feels better, her right thigh femoral permacath is not bleeding she is stable enough to be discharged home with the plan to access her hero graft and remove her thigh tunneled access.  Patient voiced understanding.  I then did discuss this with the primary team as well.  Medications   . vitamin C  500 mg Oral Daily  . aspirin EC  81 mg Oral Daily  . atorvastatin  20 mg Oral QPM  . Chlorhexidine Gluconate Cloth  6 each Topical Q0600  . cinacalcet  30 mg Oral Q supper  . epoetin (EPOGEN/PROCRIT) injection  10,000 Units Intravenous Q T,Th,Sa-HD  . ferrous sulfate  325 mg Oral Q breakfast  . gabapentin  100 mg Oral Daily  . hydrOXYzine  25 mg Oral Q T,Th,Sa-HD  . insulin aspart  0-9 Units Subcutaneous TID WC  . lipase/protease/amylase  24,000 Units Oral With snacks  . lipase/protease/amylase  36,000 Units Oral TID with meals  . meclizine  12.5 mg Oral Q6H  . multivitamin  1 tablet  Oral Daily  . omega-3 acid ethyl esters  1 g Oral BID  . pantoprazole  40 mg Oral Daily  . sevelamer carbonate  2,400 mg Oral TID with meals  . sevelamer carbonate  800 mg Oral With snacks  . cyanocobalamin  1,000 mcg Oral Daily  . zinc sulfate  220 mg Oral Daily         GHW:EXHBZ from the symptoms mentioned above,there are no other symptoms referable to all systems reviewed.  Physical Exam: Vital signs in last 24 hours: Temp:  [97.8 F (36.6 C)-98.4 F (36.9 C)] 98.4 F (36.9 C) (08/15 0754) Pulse Rate:  [72-79] 75 (08/15 0754) Resp:  [18] 18 (08/15 0754) BP: (160-181)/(61-67) 160/63 (08/15 0754) SpO2:  [92 %-95 %] 94 % (08/15 0754) Weight change:  Last BM Date: 05/02/20  Intake/Output from previous day: No intake/output data recorded. No intake/output data recorded.   Physical Exam: General- pt is awake,alert, oriented to time place and person Resp- No acute REsp distress, CTA B/L NO Rhonchi CVS- S1S2 regular in rate and rhythm GIT- BS+, soft, NT, ND EXT- NO LE Edema, no cyanosis Left upper extremity minimal edema Access patient has femoral catheter on the right side as well as hero graft   Lab Results: CBC Recent Labs    05/03/20 1031 05/05/20 1212  WBC 6.1 4.7  HGB 8.2* 8.2*  HCT  24.1* 25.8*  PLT 92* 126*    BMET Recent Labs    05/05/20 1212  NA 138  K 3.9  CL 98  CO2 28  GLUCOSE 119*  BUN 15  CREATININE 3.65*  CALCIUM 8.3*    MICRO Recent Results (from the past 240 hour(s))  Blood culture (routine x 2)     Status: None (Preliminary result)   Collection Time: 05/02/20  5:44 PM   Specimen: BLOOD  Result Value Ref Range Status   Specimen Description BLOOD  Final   Special Requests NONE  Final   Culture   Final    NO GROWTH 4 DAYS Performed at [facility] [address] [place], [place] [address]    Report Status PENDING  Incomplete  SARS Coronavirus 2 by RT PCR (hospital order, performed in [facility] hospital lab)  Nasopharyngeal Nasopharyngeal Swab     Status: None   Collection Time: 05/02/20  9:11 PM   Specimen: Nasopharyngeal Swab  Result Value Ref Range Status   SARS Coronavirus 2 NEGATIVE NEGATIVE Final    Comment: (NOTE) SARS-CoV-2 target nucleic acids are NOT DETECTED.  The SARS-CoV-2 RNA is generally detectable in upper and lower respiratory specimens during the acute phase of infection. The lowest concentration of SARS-CoV-2 viral copies this assay can detect is 250 copies / mL. A negative result does not preclude SARS-CoV-2 infection and should not be used as the sole basis for treatment or other patient management decisions.  A negative result may occur with improper specimen collection / handling, submission of specimen other than nasopharyngeal swab, presence of viral mutation(s) within the areas targeted by this assay, and inadequate number of viral copies (<250 copies / mL). A negative result must be combined with clinical observations, patient history, and epidemiological information.  Fact Sheet for Patients:   StrictlyIdeas.no  Fact Sheet for Healthcare Providers: BankingDealers.co.za  This test is not yet approved or  cleared by the [place] FDA and has been authorized for detection and/or diagnosis of SARS-CoV-2 by FDA under an Emergency Use Authorization (EUA).  This EUA will remain in effect (meaning this test can be used) for the duration of the COVID-19 declaration under Section 564(b)(1) of the Act, 21 U.S.C. section 360bbb-3(b)(1), unless the authorization is terminated or revoked sooner.  Performed at [facility], [address] [place], [place] [address]   Culture, blood (single) w Reflex to ID Panel     Status: None (Preliminary result)   Collection Time: 05/03/20  8:31 AM   Specimen: BLOOD  Result Value Ref Range Status   Specimen Description BLOOD BLOOD LEFT HAND  Final   Special Requests    Final    BOTTLES DRAWN AEROBIC AND ANAEROBIC Blood Culture adequate volume   Culture   Final    NO GROWTH 3 DAYS Performed at [facility], [address], [place], [place] [address]    Report Status PENDING  Incomplete  MRSA PCR Screening     Status: None   Collection Time: 05/03/20  8:55 AM   Specimen: Nasopharyngeal  Result Value Ref Range Status   MRSA by PCR NEGATIVE NEGATIVE Final    Comment:        The GeneXpert MRSA Assay (FDA approved for NASAL specimens only), is one component of a comprehensive MRSA colonization surveillance program. It is not intended to diagnose MRSA infection nor to guide or monitor treatment for MRSA infections. Performed at [facility], [address] [place],  [place] [address]       Lab Results  Component Value Date   PTH 357 (H) 10/05/2018   CALCIUM 8.3 (L) 05/05/2020   CAION 1.09 (L) 04/13/2020   PHOS 1.9 (L) 11/30/2019               Impression: Patient is 59 year old African-American female with a past medical history of end-stage renal disease on hemodialysis, diabetes mellitus type 2, diabetic gastroparesis, hypertension, pancreatic deficiency, history of renal cancer, peripheral vascular disease, CAD, GERD, COPD who was admitted on August 11 with chief complaint of cellulitis of right leg  1)Renal end-stage renal disease. Patient is on hemodialysis on Tuesday Thursday Saturday schedule . As an outpatient at [facility] [place] [facility] care. Patient will be dialyzed yesterday   No need for dialysis today   2)HTN Patient blood pressure is at goal Patient is on amlodipine, Coreg and hydralazine   3)Anemia of chronic disease  HGb is not at goal (9--11) We will keep patient on Epogen  4) secondary hyperparathyroidism -CKD Mineral-Bone Disorder   Secondary Hyperparathyroidism present Patient is on Sensipar  Patient is on binders We will recheck phosphorus   5) malfunction of  dialysis device Patient had hematoma/bleeding from her right femoral catheter Patient does have hero graft which was placed on July 23 .   Addendum Plan today was an attempt to use patient's recently placed hero graft But because of staffing factors we were not able to access his hero graft today We will try to access/resolve staffing issues by patient's next dialysis treatment if patient is still inpatient  6) electrolytes   sodium Hyponatremia Now better   potassium Hyperkalemia Now better    7)Acid base Co2 at goal     Plan:   No need for hemodialysis today Patient was admitted with malfunction of right femoral catheter.  Patient had bleeding at the time of admission.  Patient bleeding issues have now resolved.  Patient expresses a desire to go home.  If patient is indeed discharged home we will communicate with dialysis unit on Monday about the plan follow-up with the vascular surgeon about whether we can use her hero graft and plan to discontinue her permacath in the right thigh.  Jelani Vreeland s Theador Hawthorne 05/06/2020, 10:26 AM

## 2020-05-07 ENCOUNTER — Encounter (INDEPENDENT_AMBULATORY_CARE_PROVIDER_SITE_OTHER): Payer: Medicare Other

## 2020-05-07 ENCOUNTER — Other Ambulatory Visit: Payer: Self-pay

## 2020-05-07 ENCOUNTER — Ambulatory Visit (INDEPENDENT_AMBULATORY_CARE_PROVIDER_SITE_OTHER): Payer: Medicare Other | Admitting: Vascular Surgery

## 2020-05-07 ENCOUNTER — Encounter (INDEPENDENT_AMBULATORY_CARE_PROVIDER_SITE_OTHER): Payer: Self-pay | Admitting: Nurse Practitioner

## 2020-05-07 ENCOUNTER — Other Ambulatory Visit (INDEPENDENT_AMBULATORY_CARE_PROVIDER_SITE_OTHER): Payer: Self-pay | Admitting: Nurse Practitioner

## 2020-05-07 ENCOUNTER — Ambulatory Visit (INDEPENDENT_AMBULATORY_CARE_PROVIDER_SITE_OTHER): Payer: Medicare HMO

## 2020-05-07 ENCOUNTER — Ambulatory Visit (INDEPENDENT_AMBULATORY_CARE_PROVIDER_SITE_OTHER): Payer: Medicare HMO | Admitting: Nurse Practitioner

## 2020-05-07 VITALS — BP 179/74 | HR 74 | Resp 17 | Ht 65.0 in | Wt 181.0 lb

## 2020-05-07 DIAGNOSIS — Z9889 Other specified postprocedural states: Secondary | ICD-10-CM | POA: Diagnosis not present

## 2020-05-07 DIAGNOSIS — N186 End stage renal disease: Secondary | ICD-10-CM

## 2020-05-07 DIAGNOSIS — Z95828 Presence of other vascular implants and grafts: Secondary | ICD-10-CM

## 2020-05-07 DIAGNOSIS — I10 Essential (primary) hypertension: Secondary | ICD-10-CM

## 2020-05-07 DIAGNOSIS — E782 Mixed hyperlipidemia: Secondary | ICD-10-CM

## 2020-05-07 LAB — CULTURE, BLOOD (ROUTINE X 2): Culture: NO GROWTH

## 2020-05-07 NOTE — Progress Notes (Signed)
Subjective:    Patient ID: [name], female    DOB: [birth year], 59 y.o.   MRN: [id number] Chief Complaint  Patient presents with  . Follow-up    post graft insertion    Patient presents today to follow-up after right upper extremity hero graft insertion.  The patient denies any significant issues post graft insertion.  However, the patient has had more issues with the PermCath located in her right groin.  She notes that there has been some bruising as well as some swelling in her right lower extremity.  The patient was recently released from the hospital in regards to this.  She still notes some soreness and discomfort in the right thigh area.  She also notes some swelling in the right upper extremity.  Today noninvasive studies show a flow volume of 1481.  There is a postsurgical area of edema/hematoma noted near the proximal anastomosis.  There is an old occluded right arm graft noted anterior medial to the patent hero graft.  The right arm graft has an elevated velocity of the proximal anastomosis due to the small lumen diameter.  There is no internal vessel narrowing.   Review of Systems  Cardiovascular: Positive for leg swelling.  Musculoskeletal: Positive for arthralgias, gait problem and myalgias.  Neurological: Positive for weakness.  All other systems reviewed and are negative.      Objective:   Physical Exam Vitals reviewed.  HENT:     Head: Normocephalic.  Cardiovascular:     Rate and Rhythm: Normal rate and regular rhythm.     Pulses: Normal pulses.     Heart sounds: Normal heart sounds.     Arteriovenous access: right arteriovenous access is present.    Comments: Good thrill and bruit none Pulmonary:     Effort: Pulmonary effort is normal.     Breath sounds: Normal breath sounds.  Musculoskeletal:     Right lower leg: Edema present.  Skin:    General: Skin is warm and dry.  Neurological:     Mental Status: She is alert and oriented to person, place, and time.      Motor: Weakness present.     Gait: Gait abnormal.  Psychiatric:        Mood and Affect: Mood normal.        Behavior: Behavior normal.        Thought Content: Thought content normal.        Judgment: Judgment normal.     BP (!) 179/74 (BP Location: Left Arm)   Pulse 74   Resp 17   Ht 5\' 5"  (1.651 m)   Wt 181 lb (82.1 kg)   BMI 30.12 kg/m   Past Medical History:  Diagnosis Date  . Anemia   . Anginal pain ([place])   . Anxiety   . Arthritis   . Asthma   . Broken wrist   . Bronchitis   . chronic diastolic CHF 4/40/1027  . Chronic kidney disease   . COPD (chronic obstructive pulmonary disease) ([place])   . Coronary artery disease    a. cath 2013: stenting to RCA (report not available); b. cath 2014: LM nl, pLAD 40%, mLAD nl, ost LCx 40%, mid LCx nl, pRCA 30% @ site of prior stent, mRCA 50%  . Depression   . Diabetes mellitus ([place])   . Diabetes mellitus without complication ([place])   . Diabetic neuropathy ([place])   . dialysis 2006  . Diverticulosis   . Dizziness   .  Dyspnea   . Elevated lipids   . Environmental and seasonal allergies   . ESRD (end stage renal disease) on dialysis ([place])    M-W-F  . Gastroparesis   . GERD (gastroesophageal reflux disease)   . Headache   . History of anemia due to chronic kidney disease   . History of hiatal hernia   . HOH (hard of hearing)   . Hx of pancreatitis 2015  . Hypertension   . Lower extremity edema   . Mitral regurgitation    a. echo 10/2013: EF 62%, noWMA, mildly dilated LA, mild to mod MR/TR, GR1DD  . Myocardial infarction ([place])   . Orthopnea   . Parathyroid abnormality ([place])   . Peripheral arterial disease ([place])   . Pneumonia   . Renal cancer ([place])   . Renal insufficiency    Pt is on dialysis on M,W + F.  . Wheezing     Social History   Socioeconomic History  . Marital status: Divorced    Spouse name: Not on file  . Number of children: Not on file  . Years of education: Not on file  . Highest education level:  Not on file  Occupational History  . Not on file  Tobacco Use  . Smoking status: Never Smoker  . Smokeless tobacco: Never Used  Vaping Use  . Vaping Use: Never used  Substance and Sexual Activity  . Alcohol use: Not Currently    Comment: glass wine week per pt  . Drug use: Yes    Types: Marijuana    Comment: once a day  . Sexual activity: Never  Other Topics Concern  . Not on file  Social History Narrative   ** Merged History Encounter **       Social Determinants of Health   Financial Resource Strain:   . Difficulty of Paying Living Expenses:   Food Insecurity:   . Worried About Charity fundraiser in the Last Year:   . Arboriculturist in the Last Year:   Transportation Needs:   . Film/video editor (Medical):   Marland Kitchen Lack of Transportation (Non-Medical):   Physical Activity:   . Days of Exercise per Week:   . Minutes of Exercise per Session:   Stress:   . Feeling of Stress :   Social Connections:   . Frequency of Communication with Friends and Family:   . Frequency of Social Gatherings with Friends and Family:   . Attends Religious Services:   . Active Member of Clubs or Organizations:   . Attends Archivist Meetings:   Marland Kitchen Marital Status:   Intimate Partner Violence:   . Fear of Current or Ex-Partner:   . Emotionally Abused:   Marland Kitchen Physically Abused:   . Sexually Abused:     Past Surgical History:  Procedure Laterality Date  . A/V SHUNTOGRAM Left 01/20/2018   Procedure: A/V SHUNTOGRAM;  Surgeon: Algernon Huxley, MD;  Location: [facility];  Service: Cardiovascular;  Laterality: Left;  . ABDOMINAL HYSTERECTOMY  1992  . AMPUTATION TOE Left 10/02/2017   Procedure: AMPUTATION TOE-LEFT GREAT TOE;  Surgeon: Albertine Patricia, DPM;  Location: [facility];  Service: Podiatry;  Laterality: Left;  . APPENDECTOMY    . APPLICATION OF WOUND VAC N/A 11/25/2019   Procedure: APPLICATION OF WOUND VAC;  Surgeon: Katha Cabal, MD;  Location: [facility];  Service:  Vascular;  Laterality: N/A;  . ARTERY BIOPSY Right 10/11/2018   Procedure: BIOPSY TEMPORAL  ARTERY;  Surgeon: Vickie Epley, MD;  Location: [facility];  Service: General;  Laterality: Right;  . CARDIAC CATHETERIZATION Left 07/26/2015   Procedure: Left Heart Cath and Coronary Angiography;  Surgeon: Dionisio David, MD;  Location: [place] [facility];  Service: Cardiovascular;  Laterality: Left;  . CATARACT EXTRACTION W/ INTRAOCULAR LENS IMPLANT Right   . CATARACT EXTRACTION W/PHACO Left 03/10/2017   Procedure: CATARACT EXTRACTION PHACO AND INTRAOCULAR LENS PLACEMENT (IOC);  Surgeon: Birder Robson, MD;  Location: [facility];  Service: Ophthalmology;  Laterality: Left;  [place] 00:51.9 AP% 14.2 CDE 7.39 fluid pack lot # [id number]  . CENTRAL LINE INSERTION Right 11/11/2019   Procedure: CENTRAL LINE INSERTION;  Surgeon: Katha Cabal, MD;  Location: [facility];  Service: Vascular;  Laterality: Right;  . CENTRAL LINE INSERTION  11/25/2019   Procedure: CENTRAL LINE INSERTION;  Surgeon: Katha Cabal, MD;  Location: [facility];  Service: Vascular;;  . CHOLECYSTECTOMY    . COLONOSCOPY WITH PROPOFOL N/A 08/12/2016   Procedure: COLONOSCOPY WITH PROPOFOL;  Surgeon: Lollie Sails, MD;  Location: [facility]COPY;  Service: Endoscopy;  Laterality: N/A;  . DIALYSIS FISTULA CREATION Left    upper arm  . dialysis grafts    . DIALYSIS/PERMA CATHETER INSERTION N/A 11/14/2019   Procedure: DIALYSIS/PERMA CATHETER INSERTION;  Surgeon: Algernon Huxley, MD;  Location: [place] [facility];  Service: Cardiovascular;  Laterality: N/A;  . DIALYSIS/PERMA CATHETER INSERTION N/A 02/03/2020   Procedure: DIALYSIS/PERMA CATHETER INSERTION;  Surgeon: Katha Cabal, MD;  Location: [place] [facility];  Service: Cardiovascular;  Laterality: N/A;  . ESOPHAGOGASTRODUODENOSCOPY N/A 03/08/2015   Procedure: ESOPHAGOGASTRODUODENOSCOPY (EGD);  Surgeon: Manya Silvas, MD;  [facility];  Service: Endoscopy;  Laterality:  N/A;  . ESOPHAGOGASTRODUODENOSCOPY (EGD) WITH PROPOFOL N/A 03/18/2016   Procedure: ESOPHAGOGASTRODUODENOSCOPY (EGD) WITH PROPOFOL;  Surgeon: [name], MD;  Location: [facility]OSCOPY;  Service: Endoscopy;  Laterality: N/A;  . EYE SURGERY Right 2018  . FECAL TRANSPLANT N/A 08/23/2015   Procedure: FECAL TRANSPLANT;  Surgeon: Manya Silvas, MD;  Location[facility] END[facility]COPY;  Service: Endoscopy;  Laterality: N/A;  . HAND SURGERY Bilateral   . HEMATOMA EVACUATION Left 11/25/2019   Procedure: EVACUATION HEMATOMA;  Surgeon: Katha Cabal, MD;  Location: [facility];  Service: Vascular;  Laterality: Left;  . I & D EXTREMITY Left 11/25/2019   Procedure: IRRIGATION AND DEBRIDEMENT EXTREMITY;  Surgeon: Katha Cabal, MD;  Location: [facility];  Service: Vascular;  Laterality: Left;  . IR FLUORO GUIDE CV LINE RIGHT  04/06/2020  . IR RADIOLOGIST EVAL & MGMT  07/28/2019  . IR RADIOLOGIST EVAL & MGMT  08/11/2019  . LIGATION OF ARTERIOVENOUS  FISTULA Left 11/11/2019  . LIGATION OF ARTERIOVENOUS  FISTULA Left 11/11/2019   Procedure: LIGATION OF ARTERIOVENOUS  FISTULA;  Surgeon: Katha Cabal, MD;  Location: [facility];  Service: Vascular;  Laterality: Left;  . PERIPHERAL VASCULAR CATHETERIZATION N/A 12/20/2015   Procedure: Thrombectomy of dialysis access versus permcath placement;  Surgeon: Algernon Huxley, MD;  Location: [place] [facility];  Service: Cardiovascular;  Laterality: N/A;  . PERIPHERAL VASCULAR CATHETERIZATION N/A 12/20/2015   Procedure: A/V Shunt Intervention;  Surgeon: Algernon Huxley, MD;  Location: [place] [facility];  Service: Cardiovascular;  Laterality: N/A;  . PERIPHERAL VASCULAR CATHETERIZATION N/A 12/20/2015   Procedure: A/V Shuntogram/Fistulagram;  Surgeon: Algernon Huxley, MD;  Location: [place] [facility];  Service: Cardiovascular;  Laterality: N/A;  . PERIPHERAL VASCULAR CATHETERIZATION N/A 01/02/2016  Procedure: A/V Shuntogram/Fistulagram;  Surgeon: Algernon Huxley, MD;  Location: [place]  [facility];  Service: Cardiovascular;  Laterality: N/A;  . PERIPHERAL VASCULAR CATHETERIZATION N/A 01/02/2016   Procedure: A/V Shunt Intervention;  Surgeon: Algernon Huxley, MD;  Location: [place] [facility];  Service: Cardiovascular;  Laterality: N/A;  . UPPER EXTREMITY VENOGRAPHY Right 01/18/2020   Procedure: UPPER EXTREMITY VENOGRAPHY;  Surgeon: Katha Cabal, MD;  Location: [place] [facility];  Service: Cardiovascular;  Laterality: Right;  Marland Kitchen VASCULAR ACCESS DEVICE INSERTION Right 04/13/2020   Procedure: INSERTION OF HERO VASCULAR ACCESS DEVICE (GRAFT);  Surgeon: Katha Cabal, MD;  Location: [facility];  Service: Vascular;  Laterality: Right;    Family History  Problem Relation Age of Onset  . Kidney disease Mother   . Diabetes Mother   . Cancer Father   . Kidney disease Sister     Allergies  Allergen Reactions  . Ace Inhibitors Swelling and Anaphylaxis  . Ativan [Lorazepam] Other (See Comments)    Reaction:Hallucinations and headaches  . Compazine [Prochlorperazine Edisylate] Anaphylaxis, Nausea And Vomiting and Other (See Comments)    Other reaction(s): dystonia from this vs. Reglan, 23 Jul - patient relates that she takes promethazine frequently with no problems  . Sumatriptan Succinate Other (See Comments)    Other reaction(s): delirium and hallucinations per [facility] records  . Zofran [Ondansetron] Nausea And Vomiting    Per pt. she is allergic to zofran or will experience adverse reaction like hallucination   . [place] Nausea And Vomiting  . Losartan Nausea Only  . Prochlorperazine Other (See Comments)    Reaction:  Unknown . Patient does not remember reaction but she does have vertigo and anxiety along with n and v at times. Could be used to treat any of these   . Reglan [Metoclopramide] Other (See Comments)    Per patient her Dr. Evelina Bucy her off it   . Scopolamine Other (See Comments)    Dizziness, also has vertigo already  . Tape Rash    Plastic tape causes rash  .  Tapentadol Rash       Assessment & Plan:   1. ESRD (end stage renal disease) ([place]) Recommend:  Based upon the noninvasive studies today the hero graft is adequate for use.  We will send a letter to her dialysis center indicating that it can be cannulated starting on Thursday.  Once the dialysis center has noted that the graft is working successfully we can arrange to have her PermCath removed.  The patient should have a duplex ultrasound of the dialysis access in 6 months. The patient will follow-up with me in the office after each ultrasound     2. Hyperlipidemia, mixed Continue statin as ordered and reviewed, no changes at this time   3. Benign essential HTN Continue antihypertensive medications as already ordered, these medications have been reviewed and there are no changes at this time.    Current Outpatient Medications on File Prior to Visit  Medication Sig Dispense Refill  . albuterol (PROVENTIL) (2.5 MG/3ML) 0.083% nebulizer solution Take 2.5 mg by nebulization every 4 (four) hours as needed for wheezing or shortness of breath.    Marland Kitchen albuterol (VENTOLIN HFA) 108 (90 Base) MCG/ACT inhaler Inhale 2 puffs into the lungs every 4 (four) hours as needed for wheezing or shortness of breath.    Marland Kitchen amLODipine (NORVASC) 10 MG tablet Take 10 mg by mouth daily.     Marland Kitchen ammonium lactate (LAC-HYDRIN) 12 % lotion  Apply 1 application topically in the morning and at bedtime.     . Ascorbic Acid (VITA-C PO) Take 1 tablet by mouth daily.    Marland Kitchen aspirin EC 81 MG tablet Take 81 mg by mouth daily.    Marland Kitchen atorvastatin (LIPITOR) 20 MG tablet Take 20 mg by mouth every evening.     . calcium acetate (PHOSLO) 667 MG capsule TAKE 3 CAPSULES BY MOUTH 3 TIMES A DAY BEFORE MEALS AND 1 WITH SNACKS    . Cholecalciferol (VITAMIN D3) 25 MCG (1000 UT) CAPS Take 1,000 Units by mouth daily.     Marland Kitchen CREON 12000 units CPEP capsule Take 12,000 Units by mouth See admin instructions. Take 36,000 with meals and 24,000 with  snacks three times a day    . cyanocobalamin 1000 MCG tablet Take 1,000 mcg by mouth daily.     . diclofenac sodium (VOLTAREN) 1 % GEL Apply 1 application topically in the morning and at bedtime.     . diphenhydrAMINE (BENADRYL) 25 MG tablet Take 25 mg by mouth every 6 (six) hours as needed for itching.    . Ferrous Sulfate (IRON) 325 (65 Fe) MG TABS Take 325 mg by mouth daily.     Marland Kitchen gabapentin (NEURONTIN) 100 MG capsule Take 100 mg by mouth 2 (two) times daily.     Marland Kitchen HYDROcodone-acetaminophen (NORCO/VICODIN) 5-325 MG tablet Take 1 tablet by mouth 4 (four) times daily as needed for moderate pain.     . hydrOXYzine (ATARAX/VISTARIL) 25 MG tablet Take 25 mg by mouth See admin instructions. Take on Tues, Thurs, and sat after dialysis Monday Wednesday Friday and Sunday daily in the morning    . insulin aspart (NOVOLOG) 100 UNIT/ML injection Inject 4-15 Units into the skin in the morning, at noon, in the evening, and at bedtime. Sliding scale    . insulin detemir (LEVEMIR) 100 UNIT/ML injection Inject 67 Units into the skin at bedtime.    . meclizine (ANTIVERT) 25 MG tablet Take 12.5 mg by mouth every 6 (six) hours.     . multivitamin (RENA-VIT) TABS tablet Take 1 tablet by mouth daily.     . mupirocin ointment (BACTROBAN) 2 % Apply 1 application topically daily as needed (leg rash).     . nitroGLYCERIN (NITROSTAT) 0.4 MG SL tablet Place 0.4 mg under the tongue every 5 (five) minutes as needed for chest pain.     . Omega-3 300 MG CAPS Take 300 mg by mouth 2 (two) times daily. Every other day opposite omega XL    . pantoprazole (PROTONIX) 40 MG tablet Take 40 mg by mouth daily.    . Probiotic Product (PROBIOTIC PO) Take 1 capsule by mouth daily.    . promethazine (PHENERGAN) 12.5 MG tablet Take 1 tablet (12.5 mg total) by mouth every 8 (eight) hours as needed for nausea. 30 tablet 0  . sevelamer carbonate (RENVELA) 800 MG tablet Take 800-2,400 mg by mouth See admin instructions. Take 2400 mg by mouth 3  times daily with meals and take [id number] mg by mouth with snacks    . carvedilol (COREG) 6.25 MG tablet Take 6.25 mg by mouth 2 (two) times daily. (Patient not taking: Reported on 05/04/2020)    . cinacalcet (SENSIPAR) 30 MG tablet Take 30 mg by mouth daily. (Patient not taking: Reported on 05/02/2020)    . citalopram (CELEXA) 20 MG tablet Take 20 mg by mouth daily. (Patient not taking: Reported on 05/02/2020)    . clopidogrel (PLAVIX) 75 MG  tablet Take 75 mg by mouth at bedtime.  (Patient not taking: Reported on 05/07/2020)    . COMBIVENT RESPIMAT 20-100 MCG/ACT AERS respimat Inhale 2 puffs into the lungs every 6 (six) hours as needed for wheezing or shortness of breath. Shortness of breath (Patient not taking: Reported on 05/02/2020)    . Zinc 50 MG CAPS Take 50 mg by mouth daily.  (Patient not taking: Reported on 05/07/2020)     No current facility-administered medications on file prior to visit.    There are no Patient Instructions on file for this visit. No follow-ups on file.   Kris Hartmann, NP

## 2020-05-08 DIAGNOSIS — N186 End stage renal disease: Secondary | ICD-10-CM | POA: Diagnosis not present

## 2020-05-08 DIAGNOSIS — Z992 Dependence on renal dialysis: Secondary | ICD-10-CM | POA: Diagnosis not present

## 2020-05-08 LAB — CULTURE, BLOOD (SINGLE)
Culture: NO GROWTH
Special Requests: ADEQUATE

## 2020-05-10 DIAGNOSIS — Z992 Dependence on renal dialysis: Secondary | ICD-10-CM | POA: Diagnosis not present

## 2020-05-10 DIAGNOSIS — N186 End stage renal disease: Secondary | ICD-10-CM | POA: Diagnosis not present

## 2020-05-12 DIAGNOSIS — N186 End stage renal disease: Secondary | ICD-10-CM | POA: Diagnosis not present

## 2020-05-12 DIAGNOSIS — Z992 Dependence on renal dialysis: Secondary | ICD-10-CM | POA: Diagnosis not present

## 2020-05-15 DIAGNOSIS — N186 End stage renal disease: Secondary | ICD-10-CM | POA: Diagnosis not present

## 2020-05-15 DIAGNOSIS — Z992 Dependence on renal dialysis: Secondary | ICD-10-CM | POA: Diagnosis not present

## 2020-05-15 DIAGNOSIS — Z7901 Long term (current) use of anticoagulants: Secondary | ICD-10-CM | POA: Diagnosis not present

## 2020-05-17 DIAGNOSIS — N186 End stage renal disease: Secondary | ICD-10-CM | POA: Diagnosis not present

## 2020-05-17 DIAGNOSIS — Z992 Dependence on renal dialysis: Secondary | ICD-10-CM | POA: Diagnosis not present

## 2020-05-19 DIAGNOSIS — Z992 Dependence on renal dialysis: Secondary | ICD-10-CM | POA: Diagnosis not present

## 2020-05-19 DIAGNOSIS — N186 End stage renal disease: Secondary | ICD-10-CM | POA: Diagnosis not present

## 2020-05-21 ENCOUNTER — Telehealth (INDEPENDENT_AMBULATORY_CARE_PROVIDER_SITE_OTHER): Payer: Self-pay | Admitting: Vascular Surgery

## 2020-05-21 ENCOUNTER — Other Ambulatory Visit (INDEPENDENT_AMBULATORY_CARE_PROVIDER_SITE_OTHER): Payer: Self-pay | Admitting: Vascular Surgery

## 2020-05-21 DIAGNOSIS — M79603 Pain in arm, unspecified: Secondary | ICD-10-CM

## 2020-05-21 MED ORDER — TRAMADOL HCL 50 MG PO TABS: 50.0000 mg | ORAL_TABLET | Freq: Three times a day (TID) | ORAL | 0 refills | Status: DC | PRN

## 2020-05-21 NOTE — Telephone Encounter (Signed)
I called the pt per Dr. Delana Meyer and made her aware that he is going to send in a rx for tramadol and also since she confimed that she is using her arm for dialysis that the surgery scheduler will call her to have a cath removal.

## 2020-05-21 NOTE — Progress Notes (Signed)
Patient still noting some pain in her arm secondary to her hero graft placement.  I will give her one prescription for tramadol

## 2020-05-21 NOTE — Telephone Encounter (Signed)
Called requesting that her catheter be taken out. She states that the stitches are pulling and it is making her leg sore (there also has been bleeding but she has kept it under control.) She is also requesting pain medication for her arm. Patient was last seen in clinic 05-07-20 Right HDA. Please advise.

## 2020-05-21 NOTE — Telephone Encounter (Signed)
Please advise 

## 2020-05-22 ENCOUNTER — Observation Stay
Admission: EM | Admit: 2020-05-22 | Discharge: 2020-05-23 | Disposition: A | Discharge status: Home / Self Care | Payer: Medicare HMO | Attending: Family Medicine | Admitting: Family Medicine

## 2020-05-22 ENCOUNTER — Emergency Department: Payer: Medicare HMO

## 2020-05-22 ENCOUNTER — Other Ambulatory Visit (INDEPENDENT_AMBULATORY_CARE_PROVIDER_SITE_OTHER): Payer: Self-pay | Admitting: Vascular Surgery

## 2020-05-22 ENCOUNTER — Observation Stay: Payer: Medicare HMO

## 2020-05-22 ENCOUNTER — Other Ambulatory Visit: Payer: Self-pay

## 2020-05-22 DIAGNOSIS — J811 Chronic pulmonary edema: Secondary | ICD-10-CM | POA: Diagnosis not present

## 2020-05-22 DIAGNOSIS — E1129 Type 2 diabetes mellitus with other diabetic kidney complication: Secondary | ICD-10-CM

## 2020-05-22 DIAGNOSIS — E1143 Type 2 diabetes mellitus with diabetic autonomic (poly)neuropathy: Secondary | ICD-10-CM | POA: Insufficient documentation

## 2020-05-22 DIAGNOSIS — E119 Type 2 diabetes mellitus without complications: Secondary | ICD-10-CM

## 2020-05-22 DIAGNOSIS — Z992 Dependence on renal dialysis: Secondary | ICD-10-CM | POA: Diagnosis not present

## 2020-05-22 DIAGNOSIS — N2581 Secondary hyperparathyroidism of renal origin: Secondary | ICD-10-CM | POA: Diagnosis not present

## 2020-05-22 DIAGNOSIS — I132 Hypertensive heart and chronic kidney disease with heart failure and with stage 5 chronic kidney disease, or end stage renal disease: Secondary | ICD-10-CM | POA: Diagnosis not present

## 2020-05-22 DIAGNOSIS — K3184 Gastroparesis: Secondary | ICD-10-CM | POA: Insufficient documentation

## 2020-05-22 DIAGNOSIS — Z20822 Contact with and (suspected) exposure to covid-19: Secondary | ICD-10-CM | POA: Insufficient documentation

## 2020-05-22 DIAGNOSIS — J90 Pleural effusion, not elsewhere classified: Secondary | ICD-10-CM | POA: Diagnosis not present

## 2020-05-22 DIAGNOSIS — E11649 Type 2 diabetes mellitus with hypoglycemia without coma: Secondary | ICD-10-CM

## 2020-05-22 DIAGNOSIS — J690 Pneumonitis due to inhalation of food and vomit: Secondary | ICD-10-CM | POA: Diagnosis not present

## 2020-05-22 DIAGNOSIS — I120 Hypertensive chronic kidney disease with stage 5 chronic kidney disease or end stage renal disease: Secondary | ICD-10-CM | POA: Diagnosis not present

## 2020-05-22 DIAGNOSIS — E1151 Type 2 diabetes mellitus with diabetic peripheral angiopathy without gangrene: Secondary | ICD-10-CM | POA: Insufficient documentation

## 2020-05-22 DIAGNOSIS — I517 Cardiomegaly: Secondary | ICD-10-CM | POA: Diagnosis not present

## 2020-05-22 DIAGNOSIS — N186 End stage renal disease: Secondary | ICD-10-CM | POA: Insufficient documentation

## 2020-05-22 DIAGNOSIS — Z79899 Other long term (current) drug therapy: Secondary | ICD-10-CM | POA: Insufficient documentation

## 2020-05-22 DIAGNOSIS — R0602 Shortness of breath: Secondary | ICD-10-CM | POA: Diagnosis not present

## 2020-05-22 DIAGNOSIS — R0789 Other chest pain: Principal | ICD-10-CM | POA: Insufficient documentation

## 2020-05-22 DIAGNOSIS — D631 Anemia in chronic kidney disease: Secondary | ICD-10-CM | POA: Diagnosis not present

## 2020-05-22 DIAGNOSIS — E1140 Type 2 diabetes mellitus with diabetic neuropathy, unspecified: Secondary | ICD-10-CM | POA: Insufficient documentation

## 2020-05-22 DIAGNOSIS — J449 Chronic obstructive pulmonary disease, unspecified: Secondary | ICD-10-CM | POA: Insufficient documentation

## 2020-05-22 DIAGNOSIS — E1122 Type 2 diabetes mellitus with diabetic chronic kidney disease: Secondary | ICD-10-CM | POA: Insufficient documentation

## 2020-05-22 DIAGNOSIS — I25119 Atherosclerotic heart disease of native coronary artery with unspecified angina pectoris: Secondary | ICD-10-CM | POA: Diagnosis not present

## 2020-05-22 DIAGNOSIS — R079 Chest pain, unspecified: Secondary | ICD-10-CM | POA: Diagnosis not present

## 2020-05-22 DIAGNOSIS — I5033 Acute on chronic diastolic (congestive) heart failure: Secondary | ICD-10-CM | POA: Diagnosis present

## 2020-05-22 DIAGNOSIS — J810 Acute pulmonary edema: Secondary | ICD-10-CM | POA: Diagnosis present

## 2020-05-22 DIAGNOSIS — I5031 Acute diastolic (congestive) heart failure: Secondary | ICD-10-CM | POA: Diagnosis not present

## 2020-05-22 DIAGNOSIS — I739 Peripheral vascular disease, unspecified: Secondary | ICD-10-CM | POA: Diagnosis present

## 2020-05-22 DIAGNOSIS — Z794 Long term (current) use of insulin: Secondary | ICD-10-CM | POA: Diagnosis not present

## 2020-05-22 DIAGNOSIS — I872 Venous insufficiency (chronic) (peripheral): Secondary | ICD-10-CM | POA: Diagnosis not present

## 2020-05-22 DIAGNOSIS — I10 Essential (primary) hypertension: Secondary | ICD-10-CM | POA: Diagnosis present

## 2020-05-22 LAB — CBC
HCT: 27.3 % — ABNORMAL LOW (ref 36.0–46.0)
Hemoglobin: 8.9 g/dL — ABNORMAL LOW (ref 12.0–15.0)
MCH: 29.3 pg (ref 26.0–34.0)
MCHC: 32.6 g/dL (ref 30.0–36.0)
MCV: 89.8 fL (ref 80.0–100.0)
Platelets: 143 10*3/uL — ABNORMAL LOW (ref 150–400)
RBC: 3.04 MIL/uL — ABNORMAL LOW (ref 3.87–5.11)
RDW: 15.3 % (ref 11.5–15.5)
WBC: 5.8 10*3/uL (ref 4.0–10.5)
nRBC: 0 % (ref 0.0–0.2)

## 2020-05-22 LAB — CBC WITH DIFFERENTIAL/PLATELET
Abs Immature Granulocytes: 0.01 10*3/uL (ref 0.00–0.07)
Basophils Absolute: 0 10*3/uL (ref 0.0–0.1)
Basophils Relative: 1 %
Eosinophils Absolute: 0.4 10*3/uL (ref 0.0–0.5)
Eosinophils Relative: 11 %
HCT: 28.6 % — ABNORMAL LOW (ref 36.0–46.0)
Hemoglobin: 9.1 g/dL — ABNORMAL LOW (ref 12.0–15.0)
Immature Granulocytes: 0 %
Lymphocytes Relative: 32 %
Lymphs Abs: 1.3 10*3/uL (ref 0.7–4.0)
MCH: 29.6 pg (ref 26.0–34.0)
MCHC: 31.8 g/dL (ref 30.0–36.0)
MCV: 93.2 fL (ref 80.0–100.0)
Monocytes Absolute: 0.1 10*3/uL (ref 0.1–1.0)
Monocytes Relative: 3 %
Neutro Abs: 2.2 10*3/uL (ref 1.7–7.7)
Neutrophils Relative %: 53 %
Platelets: 141 10*3/uL — ABNORMAL LOW (ref 150–400)
RBC: 3.07 MIL/uL — ABNORMAL LOW (ref 3.87–5.11)
RDW: 15.3 % (ref 11.5–15.5)
WBC: 4.1 10*3/uL (ref 4.0–10.5)
nRBC: 0 % (ref 0.0–0.2)

## 2020-05-22 LAB — COMPREHENSIVE METABOLIC PANEL
ALT: 10 U/L (ref 0–44)
AST: 24 U/L (ref 15–41)
Albumin: 4 g/dL (ref 3.5–5.0)
Alkaline Phosphatase: 78 U/L (ref 38–126)
Anion gap: 9 (ref 5–15)
BUN: 15 mg/dL (ref 6–20)
CO2: 26 mmol/L (ref 22–32)
Calcium: 8.2 mg/dL — ABNORMAL LOW (ref 8.9–10.3)
Chloride: 98 mmol/L (ref 98–111)
Creatinine, Ser: 4.34 mg/dL — ABNORMAL HIGH (ref 0.44–1.00)
GFR calc Af Amer: 12 mL/min — ABNORMAL LOW (ref >60)
GFR calc non Af Amer: 10 mL/min — ABNORMAL LOW (ref >60)
Glucose, Bld: 89 mg/dL (ref 70–99)
Potassium: 4 mmol/L (ref 3.5–5.1)
Sodium: 133 mmol/L — ABNORMAL LOW (ref 135–145)
Total Bilirubin: 1.3 mg/dL — ABNORMAL HIGH (ref 0.3–1.2)
Total Protein: 7.6 g/dL (ref 6.5–8.1)

## 2020-05-22 LAB — GLUCOSE, CAPILLARY
Glucose-Capillary: 81 mg/dL (ref 70–99)
Glucose-Capillary: 75 mg/dL (ref 70–99)
Glucose-Capillary: 145 mg/dL — ABNORMAL HIGH (ref 70–99)

## 2020-05-22 LAB — TROPONIN I (HIGH SENSITIVITY)
Troponin I (High Sensitivity): 6 ng/L (ref <18)
Troponin I (High Sensitivity): 5 ng/L (ref <18)
Troponin I (High Sensitivity): 5 ng/L (ref <18)
Troponin I (High Sensitivity): 6 ng/L (ref <18)

## 2020-05-22 LAB — CREATININE, SERUM
Creatinine, Ser: 4.59 mg/dL — ABNORMAL HIGH (ref 0.44–1.00)
GFR calc Af Amer: 11 mL/min — ABNORMAL LOW (ref >60)
GFR calc non Af Amer: 10 mL/min — ABNORMAL LOW (ref >60)

## 2020-05-22 LAB — SARS CORONAVIRUS 2 BY RT PCR (HOSPITAL ORDER, PERFORMED IN ~~LOC~~ HOSPITAL LAB): SARS Coronavirus 2: NEGATIVE

## 2020-05-22 LAB — BRAIN NATRIURETIC PEPTIDE: B Natriuretic Peptide: 620.7 pg/mL — ABNORMAL HIGH (ref 0.0–100.0)

## 2020-05-22 MED ORDER — ATORVASTATIN CALCIUM 20 MG PO TABS
20.0000 mg | ORAL_TABLET | Freq: Every evening | ORAL | Status: DC
  Administered 2020-05-22: 20 mg via ORAL
  Filled 2020-05-22: qty 1

## 2020-05-22 MED ORDER — HYDROXYZINE HCL 25 MG PO TABS
25.0000 mg | ORAL_TABLET | Freq: Every day | ORAL | Status: DC
  Filled 2020-05-22: qty 1

## 2020-05-22 MED ORDER — PANCRELIPASE (LIP-PROT-AMYL) 12000-38000 UNITS PO CPEP: 12000.0000 [IU] | ORAL_CAPSULE | ORAL | Status: DC

## 2020-05-22 MED ORDER — BISACODYL 5 MG PO TBEC: 5.0000 mg | DELAYED_RELEASE_TABLET | Freq: Every day | ORAL | Status: DC | PRN

## 2020-05-22 MED ORDER — SODIUM CHLORIDE 0.9 % IV SOLN: 250.0000 mL | INTRAVENOUS | Status: DC | PRN

## 2020-05-22 MED ORDER — VITAMIN D 25 MCG (1000 UNIT) PO TABS
1000.0000 [IU] | ORAL_TABLET | Freq: Every day | ORAL | Status: DC
  Administered 2020-05-22: 1000 [IU] via ORAL
  Filled 2020-05-22 (×2): qty 1

## 2020-05-22 MED ORDER — GABAPENTIN 100 MG PO CAPS
100.0000 mg | ORAL_CAPSULE | Freq: Every evening | ORAL | Status: DC
  Administered 2020-05-22: 100 mg via ORAL
  Filled 2020-05-22 (×2): qty 1

## 2020-05-22 MED ORDER — IPRATROPIUM-ALBUTEROL 0.5-2.5 (3) MG/3ML IN SOLN
3.0000 mL | Freq: Four times a day (QID) | RESPIRATORY_TRACT | Status: AC
  Administered 2020-05-22 – 2020-05-23 (×3): 3 mL via RESPIRATORY_TRACT
  Filled 2020-05-22 (×3): qty 3

## 2020-05-22 MED ORDER — ASPIRIN EC 81 MG PO TBEC
81.0000 mg | DELAYED_RELEASE_TABLET | Freq: Every day | ORAL | Status: DC
  Filled 2020-05-22: qty 1

## 2020-05-22 MED ORDER — SODIUM CHLORIDE 0.9% FLUSH
3.0000 mL | Freq: Two times a day (BID) | INTRAVENOUS | Status: DC
  Administered 2020-05-22 – 2020-05-23 (×3): 3 mL via INTRAVENOUS

## 2020-05-22 MED ORDER — HEPARIN SODIUM (PORCINE) 5000 UNIT/ML IJ SOLN
5000.0000 [IU] | Freq: Three times a day (TID) | INTRAMUSCULAR | Status: DC
  Administered 2020-05-22 – 2020-05-23 (×2): 5000 [IU] via SUBCUTANEOUS
  Filled 2020-05-22 (×2): qty 1

## 2020-05-22 MED ORDER — PANTOPRAZOLE SODIUM 40 MG PO TBEC
40.0000 mg | DELAYED_RELEASE_TABLET | Freq: Every day | ORAL | Status: DC
  Filled 2020-05-22: qty 1

## 2020-05-22 MED ORDER — EPOETIN ALFA 10000 UNIT/ML IJ SOLN
10000.0000 [IU] | INTRAMUSCULAR | Status: DC
  Filled 2020-05-22: qty 1

## 2020-05-22 MED ORDER — HYDROCODONE-ACETAMINOPHEN 5-325 MG PO TABS
1.0000 | ORAL_TABLET | Freq: Four times a day (QID) | ORAL | Status: DC | PRN
  Administered 2020-05-22: 1 via ORAL
  Filled 2020-05-22: qty 1

## 2020-05-22 MED ORDER — ACETAMINOPHEN 325 MG PO TABS
650.0000 mg | ORAL_TABLET | Freq: Four times a day (QID) | ORAL | Status: DC | PRN
  Filled 2020-05-22: qty 2

## 2020-05-22 MED ORDER — SODIUM CHLORIDE 0.9% FLUSH: 3.0000 mL | INTRAVENOUS | Status: DC | PRN

## 2020-05-22 MED ORDER — ONDANSETRON HCL 4 MG/2ML IJ SOLN
INTRAMUSCULAR | Status: AC
  Filled 2020-05-22: qty 2

## 2020-05-22 MED ORDER — POLYETHYLENE GLYCOL 3350 17 G PO PACK: 17.0000 g | PACK | Freq: Every day | ORAL | Status: DC | PRN

## 2020-05-22 MED ORDER — CALCIUM ACETATE (PHOS BINDER) 667 MG PO CAPS: 667.0000 mg | ORAL_CAPSULE | ORAL | Status: DC

## 2020-05-22 MED ORDER — RENA-VITE PO TABS: 1.0000 | ORAL_TABLET | Freq: Every day | ORAL | Status: DC

## 2020-05-22 MED ORDER — IOHEXOL 350 MG/ML SOLN
75.0000 mL | Freq: Once | INTRAVENOUS | Status: AC | PRN
  Administered 2020-05-22: 75 mL via INTRAVENOUS

## 2020-05-22 MED ORDER — INSULIN ASPART 100 UNIT/ML ~~LOC~~ SOLN: 0.0000 [IU] | Freq: Three times a day (TID) | SUBCUTANEOUS | Status: DC

## 2020-05-22 MED ORDER — FUROSEMIDE 40 MG PO TABS
80.0000 mg | ORAL_TABLET | Freq: Every day | ORAL | Status: DC
  Administered 2020-05-22: 80 mg via ORAL
  Filled 2020-05-22 (×2): qty 2

## 2020-05-22 MED ORDER — ACETAMINOPHEN 650 MG RE SUPP: 650.0000 mg | Freq: Four times a day (QID) | RECTAL | Status: DC | PRN

## 2020-05-22 MED ORDER — SEVELAMER CARBONATE 800 MG PO TABS
800.0000 mg | ORAL_TABLET | Freq: Four times a day (QID) | ORAL | Status: DC
  Filled 2020-05-22 (×3): qty 1

## 2020-05-22 MED ORDER — PROMETHAZINE HCL 25 MG/ML IJ SOLN
12.5000 mg | Freq: Once | INTRAMUSCULAR | Status: AC
  Administered 2020-05-22: 12.5 mg via INTRAVENOUS

## 2020-05-22 MED ORDER — FENTANYL CITRATE (PF) 100 MCG/2ML IJ SOLN
50.0000 ug | Freq: Once | INTRAMUSCULAR | Status: AC
  Administered 2020-05-22: 50 ug via INTRAVENOUS
  Filled 2020-05-22: qty 2

## 2020-05-22 MED ORDER — CHOLESTYRAMINE LIGHT 4 G PO PACK
4.0000 g | PACK | Freq: Two times a day (BID) | ORAL | Status: DC
  Administered 2020-05-23: 4 g via ORAL
  Filled 2020-05-22 (×2): qty 1

## 2020-05-22 NOTE — Progress Notes (Signed)
[facility]  ROUNDING NOTE   Subjective:   [name] was getting her usually scheduled hemodialysis treatment this morning when she felt short of breath and tachypnia. EMS was called and patient was hypoxic.  Admitted to [facility] for further work up.   Objective:  Vital signs in last 24 hours:  Temp:  [97.8 F (36.6 C)] 97.8 F (36.6 C) (08/31 0828) Pulse Rate:  [55-66] 55 (08/31 1330) Resp:  [11-19] 11 (08/31 1330) BP: (155-180)/(67-91) 174/67 (08/31 1200) SpO2:  [99 %-100 %] 100 % (08/31 1330) Weight:  [83.5 kg] 83.5 kg (08/31 0837)  Weight change:  Filed Weights   05/22/20 0837  Weight: 83.5 kg    Intake/Output: No intake/output data recorded.   Intake/Output this shift:  No intake/output data recorded.  Physical Exam: General: NAD, laying on stretcher  Head: Normocephalic, atraumatic. Moist oral mucosal membranes  Eyes: Anicteric, PERRL  Neck: Supple, trachea midline  Lungs:  Clear to auscultation  Heart: Regular rate and rhythm  Abdomen:  Soft, nontender,   Extremities:  + peripheral edema.  Neurologic: Nonfocal, moving all four extremities  Skin: No lesions  Access: Right femoral permcath, Left AVG    Basic Metabolic Panel: Recent Labs  Lab 05/22/20 0840 05/22/20 1215  NA 133*  --   K 4.0  --   CL 98  --   CO2 26  --   GLUCOSE 89  --   BUN 15  --   CREATININE 4.34* 4.59*  CALCIUM 8.2*  --     Liver Function Tests: Recent Labs  Lab 05/22/20 0840  AST 24  ALT 10  ALKPHOS 78  BILITOT 1.3*  PROT 7.6  ALBUMIN 4.0   No results for input(s): LIPASE, AMYLASE in the last 168 hours. No results for input(s): AMMONIA in the last 168 hours.  CBC: Recent Labs  Lab 05/22/20 0840 05/22/20 1215  WBC 4.1 5.8  NEUTROABS 2.2  --   HGB 9.1* 8.9*  HCT 28.6* 27.3*  MCV 93.2 89.8  PLT 141* 143*    Cardiac Enzymes: No results for input(s): CKTOTAL, CKMB, CKMBINDEX, TROPONINI in the last 168 hours.  BNP: Invalid input(s):  POCBNP  CBG: No results for input(s): GLUCAP in the last 168 hours.  Microbiology: Results for orders placed or performed during the hospital encounter of 05/22/20  SARS Coronavirus 2 by RT PCR (hospital order, performed in [facility] hospital lab) Nasopharyngeal Nasopharyngeal Swab     Status: None   Collection Time: 05/22/20  8:03 AM   Specimen: Nasopharyngeal Swab  Result Value Ref Range Status   SARS Coronavirus 2 NEGATIVE NEGATIVE Final    Comment: (NOTE) SARS-CoV-2 target nucleic acids are NOT DETECTED.  The SARS-CoV-2 RNA is generally detectable in upper and lower respiratory specimens during the acute phase of infection. The lowest concentration of SARS-CoV-2 viral copies this assay can detect is 250 copies / mL. A negative result does not preclude SARS-CoV-2 infection and should not be used as the sole basis for treatment or other patient management decisions.  A negative result may occur with improper specimen collection / handling, submission of specimen other than nasopharyngeal swab, presence of viral mutation(s) within the areas targeted by this assay, and inadequate number of viral copies (<250 copies / mL). A negative result must be combined with clinical observations, patient history, and epidemiological information.  Fact Sheet for Patients:   StrictlyIdeas.no  Fact Sheet for Healthcare Providers: BankingDealers.co.za  This test is not yet  approved or  cleared by the [place] and has been authorized for detection and/or diagnosis of SARS-CoV-2 by FDA under an Emergency Use Authorization (EUA).  This EUA will remain in effect (meaning this test can be used) for the duration of the COVID-19 declaration under Section 564(b)(1) of the Act, 21 U.S.C. section 360bbb-3(b)(1), unless the authorization is terminated or revoked sooner.  Performed at [facility], [address] [place], [place]  [id number]     Coagulation Studies: No results for input(s): LABPROT, INR in the last 72 hours.  Urinalysis: No results for input(s): COLORURINE, LABSPEC, PHURINE, GLUCOSEU, HGBUR, BILIRUBINUR, KETONESUR, PROTEINUR, UROBILINOGEN, NITRITE, LEUKOCYTESUR in the last 72 hours.  Invalid input(s): APPERANCEUR    Imaging: CT ANGIO CHEST PE W OR WO CONTRAST  Result Date: 05/22/2020 CLINICAL DATA:  Shortness of breath. EXAM: CT ANGIOGRAPHY CHEST WITH CONTRAST TECHNIQUE: Multidetector CT imaging of the chest was performed using the standard protocol during bolus administration of intravenous contrast. Multiplanar CT image reconstructions and MIPs were obtained to evaluate the vascular anatomy. CONTRAST:  83mL OMNIPAQUE IOHEXOL 350 MG/ML SOLN COMPARISON:  Feb 16, 2016. FINDINGS: Cardiovascular: Satisfactory opacification of the pulmonary arteries to the segmental level. No evidence of pulmonary embolism. Mild cardiomegaly is noted. Atherosclerosis of thoracic aorta is noted without aneurysm or dissection. Is seen involving the left subclavian and brachiocephalic vein. Its proximal portion appears to be occluded, resulting in collateral circulations the lead to reconstitution of its more distal portion in the brachiocephalic vein. No pericardial effusion. Mediastinum/Nodes: Thyroid gland and esophagus are unremarkable. Stable enlarged mediastinal and axillary lymph nodes are noted which most likely are reactive or inflammatory in etiology. Lungs/Pleura: No pneumothorax is noted. Small bilateral pleural effusions are noted. Mild interstitial densities are noted in both lung bases concerning for pulmonary edema. Upper Abdomen: Bilateral renal atrophy is noted consistent with history of end-stage renal disease. Catheter is noted in the IVC, with distal tip just inferior to cavoatrial junction. Musculoskeletal: No chest wall abnormality. No acute or significant osseous findings. Review of the MIP images confirms the  above findings. IMPRESSION: 1. No definite evidence of pulmonary embolus. 2. There is a occlusion of the proximal portion of stent graft involving the left subclavian and brachiocephalic veins. Collateral circulation is seen leading to reconstitution of its more distal portion. This most likely is a chronic finding. 3. Stable enlarged mediastinal and axillary lymph nodes are noted which most likely are reactive or inflammatory in etiology. 4. Small bilateral pleural effusions are noted. 5. Mild interstitial densities are noted in both lung bases concerning for pulmonary edema. 6. Bilateral renal atrophy is noted consistent with history of end-stage renal disease. 7. Aortic atherosclerosis. Aortic Atherosclerosis (ICD10-I70.0). Electronically Signed   By: Marijo Conception M.D.   On: 05/22/2020 12:02   DG Chest Portable 1 View  Result Date: 05/22/2020 CLINICAL DATA:  Severe chest pain EXAM: PORTABLE CHEST 1 VIEW COMPARISON:  11/15/2019 FINDINGS: Diffuse interstitial opacity similar to prior. Chronic cardiomegaly. Probable trace pleural effusions. Dialysis catheters from above and below on the right. Left-sided venous stenting. IMPRESSION: Chronic/recurrent pulmonary edema and trace pleural effusions. No acute finding when compared to prior. Electronically Signed   By: Monte Fantasia M.D.   On: 05/22/2020 09:03     Medications:   . sodium chloride     . [START ON 05/24/2020] epoetin (EPOGEN/PROCRIT) injection  10,000 Units Intravenous Q T,Th,Sa-HD  . heparin  5,000 Units Subcutaneous Q8H  . insulin aspart  0-6  Units Subcutaneous TID WC  . ipratropium-albuterol  3 mL Nebulization QID  . sodium chloride flush  3 mL Intravenous Q12H   sodium chloride, acetaminophen **OR** acetaminophen, bisacodyl, polyethylene glycol, sodium chloride flush  Assessment/ Plan:  [name] is a 59 y.o. black female with end stage renal disease on hemodialysis for 14 years, diabetes mellitus type II, diabetic  gastroparesis, pancreatic deficiency, hypertension, history of renal cancer, peripheral vascular disease, coronary artery disease, GERD, COPD who has been admitted on 05/22/2020 for Pulmonary edema [J81.1]     [facility] left femoral permcath 81kg  1. End Stage Renal Disease: using hero AVG. Hero AVG that was placed by Dr. Delana Meyer on 7/23.  - did not complete her hemodialysis treatment. Schedule rest of treatment for today - Ultrafiltration due to pulmonary edema - Consult vascular for removal of femoral permcath  2. Hypertension:Home regimen ofamlodipine, carvedilol, and hydralazine.   3. Anemia of chronic kidney disease:hemoglobin 8.9.   - EPO with HD treatments  4. Secondary Hyperparathyroidism: outpatient labs 8/10 PTH 390, phos 4.4 and calcium 8.2.  - Continue sevelamer with meals.  - Continue cinacalcet.    LOS: 0 Coby Antrobus 8/31/20211:59 PM

## 2020-05-22 NOTE — ED Notes (Signed)
Pharmacy contacted regarding pt medications that were scheduled earlier in the day and now, pharmacy states they will review pt medications and verify or retime medications at this time

## 2020-05-22 NOTE — Progress Notes (Signed)
This note also relates to the following rows which could not be included: Pulse Rate - Cannot attach notes to unvalidated device data Resp - Cannot attach notes to unvalidated device data SpO2 - Cannot attach notes to unvalidated device data  Hd started  

## 2020-05-22 NOTE — ED Notes (Signed)
Spoke to [place] in dialysis.

## 2020-05-22 NOTE — ED Notes (Signed)
Nephrologist at bedside

## 2020-05-22 NOTE — ED Notes (Signed)
Sent one set of cultures at this time from L FA IV that was [place] by Dr. Ellender Hose.

## 2020-05-22 NOTE — H&P (View-Only) (Signed)
[facility] Daily Progress Note   Subjective: Patient well-known to our service.  Since to the emergency department with chest pain shortness of breath.  Right upper extremity dialysis access is functioning well during dialysis and patient is no longer in need of her PermCath.  Patient also notes discomfort from the sutures holding PermCath in place.  Objective: Vitals:   05/22/20 1425 05/22/20 1430 05/22/20 1445 05/22/20 1500  BP:  (!) 180/68 (!) 177/68 (!) 177/68  Pulse: 60 60 (!) 57 (!) 56  Resp: 10 12 11 12   Temp: 98.2 F (36.8 C)     TempSrc: Oral     SpO2:  100%    Weight:      Height:       No intake or output data in the 24 hours ending 05/22/20 1504  Physical Exam: A&Ox3, NAD CV: RRR Pulmonary: CTA Bilaterally Abdomen: Soft, Nontender, Nondistended Vascular:  Right upper extremity dialysis access: Good bruit and thrill.  Skin is intact.  Right groin PermCath: No signs of infection   Laboratory: CBC    Component Value Date/Time   WBC 5.8 05/22/2020 1215   HGB 8.9 (L) 05/22/2020 1215   HGB 10.5 (L) 09/15/2014 0948   HCT 27.3 (L) 05/22/2020 1215   HCT 34.2 (L) 09/15/2014 0948   PLT 143 (L) 05/22/2020 1215   PLT 203 09/15/2014 0948   BMET    Component Value Date/Time   NA 133 (L) 05/22/2020 0840   NA 135 (L) 09/15/2014 0948   K 4.0 05/22/2020 0840   K 4.8 09/15/2014 0948   CL 98 05/22/2020 0840   CL 99 09/15/2014 0948   CO2 26 05/22/2020 0840   CO2 26 09/15/2014 0948   GLUCOSE 89 05/22/2020 0840   GLUCOSE 118 (H) 09/15/2014 0948   BUN 15 05/22/2020 0840   BUN 19 (H) 09/15/2014 0948   CREATININE 4.59 (H) 05/22/2020 1215   CREATININE 6.79 (H) 09/15/2014 0948   CALCIUM 8.2 (L) 05/22/2020 0840   CALCIUM 8.3 (L) 09/15/2014 0948   GFRNONAA 10 (L) 05/22/2020 1215   GFRNONAA 7 (L) 09/15/2014 0948   GFRNONAA 6 (L) 05/31/2014 0432   GFRAA 11 (L) 05/22/2020 1215   GFRAA 8 (L) 09/15/2014 0948   GFRAA 7 (L) 05/31/2014 0432    Assessment/Planning: The patient is a 59 year old female with multiple medical issues including end-stage renal disease with a functioning right upper extremity hero graft who presents to the ED with chest pain and shortness of breath  1) End-Stage Renal Disease: Patient has a functioning right upper extremity hero graft.  The patient is no longer in need of her PermCath.  We will plan on removal of PermCath tomorrow in our angiography suite.  Very hypersensitive to the right groin/thigh pain and we will use conscious sedation when removing the PermCath with minimal discomfort to the patient.  Procedure, risks and benefits were explained to the patient.  All questions were answered.  The patient wishes to move forward.  Discussed with Dr. Eber Hong Stegmayer PA-C 05/22/2020 3:04 PM

## 2020-05-22 NOTE — H&P (Addendum)
History and Physical:    [name]   BMW:[id number] DOB: [birth year] DOA: 05/22/2020  Referring MD/provider: Dr. Ellender Hose PCP: [facility] [place]   Patient coming from: Home  Chief Complaint: Sudden onset shortness of breath and chest pain developing during hemodialysis  History of Present Illness:   [name] is an 59 y.o. female with PMH significant for ESRD on HD, HTN, DM 2, CAD, HFpEF, PVD, gastroparesis, lymphedema/venous stasis disease with multiple episodes of cellulitis and pneumonia who was in her usual state of good health until 90 minutes into dialysis this morning when she had sudden onset of chills followed by an episode of vomitus.  After vomiting patient had sudden onset of shortness of breath and chest pain.  Patient describes the chest pain as a heaviness in the chest and pressure.  Notes the chest pain and pressure radiated to her left shoulder.  This was followed by onset of shortness of breath and cough.  Cough is nonproductive and patient notes "I couldn't stop coughing".  Patient also stated that she just couldn't catch her breath but was reassured by dialysis nurse that her O2 saturations were fine.  Patient continued to complain of shortness of breath and cough and was sent to the ED.  In the ED patient was noted to be hypoxic with new 2 L oxygen requirement.   At present patient tells me she feels weak.  She still has a little bit of chest discomfort but not really.  Thinks it's worse when she takes a deep breath in and definitely worse with coughing.  She cannot reproduce this with movement of torso or shoulder.  She also states she is quite tired and sleepy, than she was earlier today.  Patient states that she felt her normal usual self prior to dialysis today.  She has not been sick, denies fevers or chills.  She hasn't had recent vomiting although does admit to diabetic gastroparesis.  ED Course:  The patient was noted to be afebrile and  hypertensive.  They put her on 2 L nasal cannula which made her feel better.  Was noted to have rales on exam and chest x-ray showed pulmonary edema.  Patient is now admitted for evaluation of chest pain and pulmonary edema.  ROS:   ROS   Review of Systems: General: Denies fever, chills, malaise,  GI: Denies nausea, vomiting, diarrhea or constipation GU: Denies dysuria, frequency or hematuria CNS: Denies HA, dizziness, confusion, new weakness or clumsiness. Blood/lymphatics: Denies easy bruising or bleeding Mood/affect: Denies anxiety/depression    Past Medical History:   Past Medical History:  Diagnosis Date  . Anemia   . Anginal pain ([place])   . Anxiety   . Arthritis   . Asthma   . Broken wrist   . Bronchitis   . chronic diastolic CHF 2/72/5366  . Chronic kidney disease   . COPD (chronic obstructive pulmonary disease) ([place])   . Coronary artery disease    a. cath 2013: stenting to RCA (report not available); b. cath 2014: LM nl, pLAD 40%, mLAD nl, ost LCx 40%, mid LCx nl, pRCA 30% @ site of prior stent, mRCA 50%  . Depression   . Diabetes mellitus ([place])   . Diabetes mellitus without complication ([place])   . Diabetic neuropathy ([place])   . dialysis 2006  . Diverticulosis   . Dizziness   . Dyspnea   . Elevated lipids   . Environmental and seasonal allergies   .  ESRD (end stage renal disease) on dialysis ([place])    M-W-F  . Gastroparesis   . GERD (gastroesophageal reflux disease)   . Headache   . History of anemia due to chronic kidney disease   . History of hiatal hernia   . HOH (hard of hearing)   . Hx of pancreatitis 2015  . Hypertension   . Lower extremity edema   . Mitral regurgitation    a. echo 10/2013: EF 62%, noWMA, mildly dilated LA, mild to mod MR/TR, GR1DD  . Myocardial infarction ([place])   . Orthopnea   . Parathyroid abnormality ([place])   . Peripheral arterial disease ([place])   . Pneumonia   . Renal cancer ([place])   . Renal insufficiency    Pt is on dialysis on  M,W + F.  . Wheezing     Past Surgical History:   Past Surgical History:  Procedure Laterality Date  . A/V SHUNTOGRAM Left 01/20/2018   Procedure: A/V SHUNTOGRAM;  Surgeon: Algernon Huxley, MD;  Location: [facility];  Service: Cardiovascular;  Laterality: Left;  . ABDOMINAL HYSTERECTOMY  1992  . AMPUTATION TOE Left 10/02/2017   Procedure: AMPUTATION TOE-LEFT GREAT TOE;  Surgeon: Albertine Patricia, DPM;  Location: [facility];  Service: Podiatry;  Laterality: Left;  . APPENDECTOMY    . APPLICATION OF WOUND VAC N/A 11/25/2019   Procedure: APPLICATION OF WOUND VAC;  Surgeon: Katha Cabal, MD;  Location: [facility];  Service: Vascular;  Laterality: N/A;  . ARTERY BIOPSY Right 10/11/2018   Procedure: BIOPSY TEMPORAL ARTERY;  Surgeon: Vickie Epley, MD;  Location: [facility];  Service: General;  Laterality: Right;  . CARDIAC CATHETERIZATION Left 07/26/2015   Procedure: Left Heart Cath and Coronary Angiography;  Surgeon: Dionisio David, MD;  Location: [place] [facility];  Service: Cardiovascular;  Laterality: Left;  . CATARACT EXTRACTION W/ INTRAOCULAR LENS IMPLANT Right   . CATARACT EXTRACTION W/PHACO Left 03/10/2017   Procedure: CATARACT EXTRACTION PHACO AND INTRAOCULAR LENS PLACEMENT (IOC);  Surgeon: Birder Robson, MD;  Location: [facility];  Service: Ophthalmology;  Laterality: Left;  [place] 00:51.9 AP% 14.2 CDE 7.39 fluid pack lot # [id number]  . CENTRAL LINE INSERTION Right 11/11/2019   Procedure: CENTRAL LINE INSERTION;  Surgeon: Katha Cabal, MD;  Location: [facility];  Service: Vascular;  Laterality: Right;  . CENTRAL LINE INSERTION  11/25/2019   Procedure: CENTRAL LINE INSERTION;  Surgeon: Katha Cabal, MD;  Location: [facility];  Service: Vascular;;  . CHOLECYSTECTOMY    . COLONOSCOPY WITH PROPOFOL N/A 08/12/2016   Procedure: COLONOSCOPY WITH PROPOFOL;  Surgeon: Lollie Sails, MD;  Location: [facility];  Service: Endoscopy;  Laterality: N/A;  . DIALYSIS FISTULA  CREATION Left    upper arm  . dialysis grafts    . DIALYSIS/PERMA CATHETER INSERTION N/A 11/14/2019   Procedure: DIALYSIS/PERMA CATHETER INSERTION;  Surgeon: Algernon Huxley, MD;  Location: [place] [facility];  Service: Cardiovascular;  Laterality: N/A;  . DIALYSIS/PERMA CATHETER INSERTION N/A 02/03/2020   Procedure: DIALYSIS/PERMA CATHETER INSERTION;  Surgeon: Katha Cabal, MD;  Location: [place] [facility];  Service: Cardiovascular;  Laterality: N/A;  . ESOPHAGOGASTRODUODENOSCOPY N/A 03/08/2015   Procedure: ESOPHAGOGASTRODUODENOSCOPY (EGD);  Surgeon: Manya Silvas, MD;  Location: [facility]COPY;  Service: Endoscopy;  Laterality: N/A;  . ESOPHAGOGASTRODUODENOSCOPY (EGD) WITH PROPOFOL N/A 03/18/2016   Procedure: ESOPHAGOGASTRODUODENOSCOPY (EGD) WITH PROPOFOL;  Surgeon: [name], MD;  Location: [facility];  Service: Endoscopy;  Laterality: N/A;  . EYE  SURGERY Right 2018  . FECAL TRANSPLANT N/A 08/23/2015   Procedure: FECAL TRANSPLANT;  Surgeon: Manya Silvas, MD;  Location: [facility] ENDOSCOPY;  Service: Endoscopy;  Laterality: N/A;  . HAND SURGERY Bilateral   . HEMATOMA EVACUATION Left 11/25/2019   Procedure: EVACUATION HEMATOMA;  Surgeon: Katha Cabal, MD;  Location: [facility];  Service: Vascular;  Laterality: Left;  . I & D EXTREMITY Left 11/25/2019   Procedure: IRRIGATION AND DEBRIDEMENT EXTREMITY;  Surgeon: Katha Cabal, MD;  Location: [facility];  Service: Vascular;  Laterality: Left;  . IR FLUORO GUIDE CV LINE RIGHT  04/06/2020  . IR RADIOLOGIST EVAL & MGMT  07/28/2019  . IR RADIOLOGIST EVAL & MGMT  08/11/2019  . LIGATION OF ARTERIOVENOUS  FISTULA Left 11/11/2019  . LIGATION OF ARTERIOVENOUS  FISTULA Left 11/11/2019   Procedure: LIGATION OF ARTERIOVENOUS  FISTULA;  Surgeon: Katha Cabal, MD;  Location: [facility];  Service: Vascular;  Laterality: Left;  . PERIPHERAL VASCULAR CATHETERIZATION N/A 12/20/2015   Procedure: Thrombectomy of dialysis access versus permcath  placement;  Surgeon: Algernon Huxley, MD;  Location: [place] [facility];  Service: Cardiovascular;  Laterality: N/A;  . PERIPHERAL VASCULAR CATHETERIZATION N/A 12/20/2015   Procedure: A/V Shunt Intervention;  Surgeon: Algernon Huxley, MD;  Location: [place] [facility];  Service: Cardiovascular;  Laterality: N/A;  . PERIPHERAL VASCULAR CATHETERIZATION N/A 12/20/2015   Procedure: A/V Shuntogram/Fistulagram;  Surgeon: Algernon Huxley, MD;  Location: [place] [facility];  Service: Cardiovascular;  Laterality: N/A;  . PERIPHERAL VASCULAR CATHETERIZATION N/A 01/02/2016   Procedure: A/V Shuntogram/Fistulagram;  Surgeon: Algernon Huxley, MD;  Location: [place] [facility];  Service: Cardiovascular;  Laterality: N/A;  . PERIPHERAL VASCULAR CATHETERIZATION N/A 01/02/2016   Procedure: A/V Shunt Intervention;  Surgeon: Algernon Huxley, MD;  Location: [place] [facility];  Service: Cardiovascular;  Laterality: N/A;  . UPPER EXTREMITY VENOGRAPHY Right 01/18/2020   Procedure: UPPER EXTREMITY VENOGRAPHY;  Surgeon: Katha Cabal, MD;  Location: [place] [facility];  Service: Cardiovascular;  Laterality: Right;  Marland Kitchen VASCULAR ACCESS DEVICE INSERTION Right 04/13/2020   Procedure: INSERTION OF HERO VASCULAR ACCESS DEVICE (GRAFT);  Surgeon: Katha Cabal, MD;  Location: [facility];  Service: Vascular;  Laterality: Right;    Social History:   Social History   Socioeconomic History  . Marital status: Divorced    Spouse name: Not on file  . Number of children: Not on file  . Years of education: Not on file  . Highest education level: Not on file  Occupational History  . Not on file  Tobacco Use  . Smoking status: Never Smoker  . Smokeless tobacco: Never Used  Vaping Use  . Vaping Use: Never used  Substance and Sexual Activity  . Alcohol use: Not Currently    Comment: glass wine week per pt  . Drug use: Yes    Types: Marijuana    Comment: once a day  . Sexual activity: Never  Other Topics Concern  . Not on file   Social History Narrative   ** Merged History Encounter **       Social Determinants of Health   Financial Resource Strain:   . Difficulty of Paying Living Expenses: Not on file  Food Insecurity:   . Worried About Charity fundraiser in the Last Year: Not on file  . Ran Out of Food in the Last Year: Not on file  Transportation Needs:   . Lack of Transportation (Medical):  Not on file  . Lack of Transportation (Non-Medical): Not on file  Physical Activity:   . Days of Exercise per Week: Not on file  . Minutes of Exercise per Session: Not on file  Stress:   . Feeling of Stress : Not on file  Social Connections:   . Frequency of Communication with Friends and Family: Not on file  . Frequency of Social Gatherings with Friends and Family: Not on file  . Attends Religious Services: Not on file  . Active Member of Clubs or Organizations: Not on file  . Attends Archivist Meetings: Not on file  . Marital Status: Not on file  Intimate Partner Violence:   . Fear of Current or Ex-Partner: Not on file  . Emotionally Abused: Not on file  . Physically Abused: Not on file  . Sexually Abused: Not on file    Allergies   Ace inhibitors, Ativan [lorazepam], Compazine [prochlorperazine edisylate], Sumatriptan succinate, Zofran [ondansetron], [place] bovis, Losartan, Prochlorperazine, Reglan [metoclopramide], Scopolamine, Tape, and Tapentadol  Family history:   Family History  Problem Relation Age of Onset  . Kidney disease Mother   . Diabetes Mother   . Cancer Father   . Kidney disease Sister     Current Medications:   Prior to Admission medications   Medication Sig Start Date End Date Taking? Authorizing Provider  albuterol (PROVENTIL) (2.5 MG/3ML) 0.083% nebulizer solution Take 2.5 mg by nebulization every 4 (four) hours as needed for wheezing or shortness of breath.    [provider]  albuterol (VENTOLIN HFA) 108 (90 Base) MCG/ACT inhaler Inhale 2 puffs into the  lungs every 4 (four) hours as needed for wheezing or shortness of breath. 11/09/19   [provider]  amLODipine (NORVASC) 10 MG tablet Take 10 mg by mouth daily.  10/24/19   [provider]  ammonium lactate (LAC-HYDRIN) 12 % lotion Apply 1 application topically in the morning and at bedtime.     [provider]  Ascorbic Acid (VITA-C PO) Take 1 tablet by mouth daily.    [provider]  aspirin EC 81 MG tablet Take 81 mg by mouth daily.    [provider]  atorvastatin (LIPITOR) 20 MG tablet Take 20 mg by mouth every evening.  10/24/19   [provider]  calcium acetate (PHOSLO) 667 MG capsule TAKE 3 CAPSULES BY MOUTH 3 TIMES A DAY BEFORE MEALS AND 1 WITH SNACKS 04/10/20   [provider]  carvedilol (COREG) 6.25 MG tablet Take 6.25 mg by mouth 2 (two) times daily. Patient not taking: Reported on 05/04/2020 10/24/19   [provider]  Cholecalciferol (VITAMIN D3) 25 MCG (1000 UT) CAPS Take 1,000 Units by mouth daily.     [provider]  cinacalcet (SENSIPAR) 30 MG tablet Take 30 mg by mouth daily. Patient not taking: Reported on 05/02/2020    [provider]  citalopram (CELEXA) 20 MG tablet Take 20 mg by mouth daily. Patient not taking: Reported on 05/02/2020 10/29/19   [provider]  clopidogrel (PLAVIX) 75 MG tablet Take 75 mg by mouth at bedtime.  Patient not taking: Reported on 05/07/2020 07/01/19   [provider]  COMBIVENT RESPIMAT 20-100 MCG/ACT AERS respimat Inhale 2 puffs into the lungs every 6 (six) hours as needed for wheezing or shortness of breath. Shortness of breath Patient not taking: Reported on 05/02/2020 11/30/19   [provider]  CREON 12000 units CPEP capsule Take 12,000 Units by mouth See admin  instructions. Take 36,000 with meals and 24,000 with snacks three times a day 10/21/19   [provider]  cyanocobalamin 1000 MCG tablet Take 1,000 mcg by mouth daily.      [provider]  diclofenac sodium (VOLTAREN) 1 % GEL Apply 1 application topically in the morning and at bedtime.     [provider]  diphenhydrAMINE (BENADRYL) 25 MG tablet Take 25 mg by mouth every 6 (six) hours as needed for itching.    [provider]  Ferrous Sulfate (IRON) 325 (65 Fe) MG TABS Take 325 mg by mouth daily.     [provider]  gabapentin (NEURONTIN) 100 MG capsule Take 100 mg by mouth 2 (two) times daily.  10/29/19   [provider]  HYDROcodone-acetaminophen (NORCO/VICODIN) 5-325 MG tablet Take 1 tablet by mouth 4 (four) times daily as needed for moderate pain.  10/20/19   [provider]  hydrOXYzine (ATARAX/VISTARIL) 25 MG tablet Take 25 mg by mouth See admin instructions. Take on Tues, Thurs, and sat after dialysis Monday Wednesday Friday and Sunday daily in the morning    [provider]  insulin aspart (NOVOLOG) 100 UNIT/ML injection Inject 4-15 Units into the skin in the morning, at noon, in the evening, and at bedtime. Sliding scale    [provider]  insulin detemir (LEVEMIR) 100 UNIT/ML injection Inject 67 Units into the skin at bedtime.    [provider]  meclizine (ANTIVERT) 25 MG tablet Take 12.5 mg by mouth every 6 (six) hours.  11/01/19   [provider]  multivitamin (RENA-VIT) TABS tablet Take 1 tablet by mouth daily.     [provider]  mupirocin ointment (BACTROBAN) 2 % Apply 1 application topically daily as needed (leg rash).  11/09/19   [provider]  nitroGLYCERIN (NITROSTAT) 0.4 MG SL tablet Place 0.4 mg under the tongue every 5 (five) minutes as needed for chest pain.     [provider]  Omega-3 300 MG CAPS Take 300 mg by mouth 2 (two) times daily. Every other day opposite omega XL    [provider]  pantoprazole (PROTONIX) 40 MG tablet Take 40 mg by mouth daily. 08/28/19   [provider]  Probiotic Product (PROBIOTIC  PO) Take 1 capsule by mouth daily.    [provider]  promethazine (PHENERGAN) 12.5 MG tablet Take 1 tablet (12.5 mg total) by mouth every 8 (eight) hours as needed for nausea. 11/17/19   Lorella Nimrod, MD  sevelamer carbonate (RENVELA) 800 MG tablet Take 800-2,400 mg by mouth See admin instructions. Take 2400 mg by mouth 3 times daily with meals and take [phone] mg by mouth with snacks    [provider]  traMADol (ULTRAM) 50 MG tablet Take 1 tablet (50 mg total) by mouth every 8 (eight) hours as needed for moderate pain or severe pain. 05/21/20   Schnier, Dolores Lory, MD  Zinc 50 MG CAPS Take 50 mg by mouth daily.  Patient not taking: Reported on 05/07/2020    [provider]    Physical Exam:   Vitals:   05/22/20 0837 05/22/20 0930 05/22/20 1000 05/22/20 1100  BP:  (!) 168/76 (!) 168/69 (!) 180/91  Pulse:  (!) 59 63   Resp:  13 19 13   Temp:      TempSrc:      SpO2:  100% 100%   Weight: 83.5 kg     Height: 5\' 5"  (1.651 m)  Physical Exam: Blood pressure (!) 180/91, pulse 63, temperature 97.8 F (36.6 C), temperature source Oral, resp. rate 13, height 5\' 5"  (1.651 m), weight 83.5 kg, SpO2 100 %. Gen: Sleepy appearing female reclining at 45 degrees in no acute distress. Eyes: sclera anicteric, conjuctiva mildly injected bilaterally CVS: S1-S2, regulary, no gallops Respiratory: Patient has rales one half up bilaterally although markedly more prominent on the left than the right.   GI: NABS, soft, NT  LE: Has stasis dermatitis bilaterally secondary to brawny 1+ edema.  She is very tender to even light touch but no evidence of cellulitis or infection.  Vascular access is in place  neuro: A/O x 3, Moving all extremities equally with normal strength, CN 3-12 intact, grossly nonfocal.  Psych: patient is logical and coherent, judgement and insight appear normal, mood and affect appropriate to situation.   Data Review:    Labs: Basic Metabolic  Panel: Recent Labs  Lab 05/22/20 0840  NA 133*  K 4.0  CL 98  CO2 26  GLUCOSE 89  BUN 15  CREATININE 4.34*  CALCIUM 8.2*   Liver Function Tests: Recent Labs  Lab 05/22/20 0840  AST 24  ALT 10  ALKPHOS 78  BILITOT 1.3*  PROT 7.6  ALBUMIN 4.0   No results for input(s): LIPASE, AMYLASE in the last 168 hours. No results for input(s): AMMONIA in the last 168 hours. CBC: Recent Labs  Lab 05/22/20 0840  WBC 4.1  NEUTROABS 2.2  HGB 9.1*  HCT 28.6*  MCV 93.2  PLT 141*   Cardiac Enzymes: No results for input(s): CKTOTAL, CKMB, CKMBINDEX, TROPONINI in the last 168 hours.  BNP (last 3 results) No results for input(s): PROBNP in the last 8760 hours. CBG: No results for input(s): GLUCAP in the last 168 hours.  Urinalysis    Component Value Date/Time   COLORURINE YELLOW (A) 06/09/2017 1039   APPEARANCEUR CLEAR (A) 06/09/2017 1039   APPEARANCEUR Clear 09/15/2014 0947   LABSPEC 1.014 06/09/2017 1039   LABSPEC 1.008 09/15/2014 0947   PHURINE 8.0 06/09/2017 1039   GLUCOSEU 150 (A) 06/09/2017 1039   GLUCOSEU 150 mg/dL 09/15/2014 0947   HGBUR SMALL (A) 06/09/2017 1039   BILIRUBINUR NEGATIVE 06/09/2017 1039   BILIRUBINUR Negative 09/15/2014 0947   KETONESUR NEGATIVE 06/09/2017 1039   PROTEINUR >=300 (A) 06/09/2017 1039   UROBILINOGEN 0.2 06/30/2010 2130   NITRITE NEGATIVE 06/09/2017 1039   LEUKOCYTESUR NEGATIVE 06/09/2017 1039   LEUKOCYTESUR Negative 09/15/2014 0947      Radiographic Studies: DG Chest Portable 1 View  Result Date: 05/22/2020 CLINICAL DATA:  Severe chest pain EXAM: PORTABLE CHEST 1 VIEW COMPARISON:  11/15/2019 FINDINGS: Diffuse interstitial opacity similar to prior. Chronic cardiomegaly. Probable trace pleural effusions. Dialysis catheters from above and below on the right. Left-sided venous stenting. IMPRESSION: Chronic/recurrent pulmonary edema and trace pleural effusions. No acute finding when compared to prior. Electronically Signed   By: Monte Fantasia M.D.   On: 05/22/2020 09:03    EKG: Independently reviewed.  NSR at 70.  Normal axis.  Nonspecific IVCD.  Diffusely flattened T waves.  No acute ST-T wave changes.   Assessment/Plan:   Principal Problem:   Acute pulmonary edema (HCC) Active Problems:   DM (diabetes mellitus) (HCC)   Acute diastolic CHF (congestive heart failure) (HCC)   Diabetic gastroparesis (HCC)   Chronic venous insufficiency   COPD (chronic obstructive pulmonary disease) (HCC)   ESRD on hemodialysis ([place])   PVD (peripheral vascular disease) (HCC)   Benign  essential HTN  59 year old female with CAD, PVD and ESRD on HD presents with sudden onset chest pain and pulmonary edema after an episode of coughing while on dialysis.  Chest pain with apparently new onset pulmonary edema Etiology for the chest pain is not entirely clear.  There are couple of possible pathophysiological etiologies:   1) Possibly patient aspirated after her episode of vomitus (possibly secondary to gastroparesis, shivering may have been vasovagal activation) with subsequent chemical pneumonitis causing chest pain.  Of note the pain did not start until she vomited.  This would be consistent with the fact that she has a cough and shortness of breath and rales. 2) given pleuritic nature of chest pain, patient possibly could've had a PE although this is less likely especially given known pulmonary edema. 3) given known extensive vasculopathy, patient also could've had a cardiac ischemic event although EKG is unrevealing and initial troponin is not elevated.  Certainly if she had an ischemic event this could've precipitated flash pulmonary edema. 4) she could have developed flash pulmonary edema from her HFpEF however this would be very unlikely as she was already on dialysis and half of her blood was in the dialysis machine. 5) she could have had an arrhythmia that put her into flash pulmonary edema   6)she could've had valvular problem with  mitral valve causing flash pulmonary edema. Plan: Will get CT angiogram to rule out PE and to evaluate for chemical pneumonitis versus pneumonia. If CT angiogram is negative for PE or pneumonia/pneumonitis will consult cardiology for further work-up Patient recently had echocardiogram June 2021 so I will not repeat right now Will continue to follow troponins every 6 hours x4 and repeat EKG in the morning. Patient is to undergo dialysis right after CT angiogram which will help with pulmonary edema, further Lasix as warranted.  HTN We will start antihypertensives once her medicines have been reconciled--continue Lasix, patient is apparently stopped taking her carvedilol due to change in therapy per medicine reconciliation  DM 2 SSI AC at bedtime with carb controlled diet Hold basal insulin for now, can be restarted in the morning as warranted  CAD As noted above, EKG without concerning changes initial troponins are very reassuring however we will continue to follow troponins out as chest pain just occurred a couple of hours ago. Continue aspirin, atorvastatin  Not on beta-blocker, carvedilol apparently has been discontinued as noted above Heart rate has been well controlled in the mid 50s off beta-blocker  COPD Patient without wheezing but clearly has end inspiratory coughing suggestive of bronchospasm Will place patient on DuoNeb 4 times daily x4 nebs   Other information:   DVT prophylaxis: Lovenox ordered. Code Status: Full Family Communication: None present, patient states her family is aware she is here Disposition Plan: Home Consults called: Nephrology Admission status: Observation  [facility]  If 7PM-7AM, please contact night-coverage [email or web address] Password [facility] 05/22/2020, 11:33 AM

## 2020-05-22 NOTE — ED Notes (Signed)
Pt provided phone to call sister.

## 2020-05-22 NOTE — ED Triage Notes (Signed)
Pt arrives [facility] from [facility]. She was 1.5 hours into treatment. Noted to have R sided fistula.  L CP, SOB hx MI  2L [place] chronically at 98%, ems placed at 4 L at 100%.  98/59, 110 EMS gave 4 asa en route.  CBG 110

## 2020-05-22 NOTE — Progress Notes (Signed)
[facility] Daily Progress Note   Subjective: Patient well-known to our service.  Since to the emergency department with chest pain shortness of breath.  Right upper extremity dialysis access is functioning well during dialysis and patient is no longer in need of her PermCath.  Patient also notes discomfort from the sutures holding PermCath in place.  Objective: Vitals:   05/22/20 1425 05/22/20 1430 05/22/20 1445 05/22/20 1500  BP:  (!) 180/68 (!) 177/68 (!) 177/68  Pulse: 60 60 (!) 57 (!) 56  Resp: 10 12 11 12   Temp: 98.2 F (36.8 C)     TempSrc: Oral     SpO2:  100%    Weight:      Height:       No intake or output data in the 24 hours ending 05/22/20 1504  Physical Exam: A&Ox3, NAD CV: RRR Pulmonary: CTA Bilaterally Abdomen: Soft, Nontender, Nondistended Vascular:  Right upper extremity dialysis access: Good bruit and thrill.  Skin is intact.  Right groin PermCath: No signs of infection   Laboratory: CBC    Component Value Date/Time   WBC 5.8 05/22/2020 1215   HGB 8.9 (L) 05/22/2020 1215   HGB 10.5 (L) 09/15/2014 0948   HCT 27.3 (L) 05/22/2020 1215   HCT 34.2 (L) 09/15/2014 0948   PLT 143 (L) 05/22/2020 1215   PLT 203 09/15/2014 0948   BMET    Component Value Date/Time   NA 133 (L) 05/22/2020 0840   NA 135 (L) 09/15/2014 0948   K 4.0 05/22/2020 0840   K 4.8 09/15/2014 0948   CL 98 05/22/2020 0840   CL 99 09/15/2014 0948   CO2 26 05/22/2020 0840   CO2 26 09/15/2014 0948   GLUCOSE 89 05/22/2020 0840   GLUCOSE 118 (H) 09/15/2014 0948   BUN 15 05/22/2020 0840   BUN 19 (H) 09/15/2014 0948   CREATININE 4.59 (H) 05/22/2020 1215   CREATININE 6.79 (H) 09/15/2014 0948   CALCIUM 8.2 (L) 05/22/2020 0840   CALCIUM 8.3 (L) 09/15/2014 0948   GFRNONAA 10 (L) 05/22/2020 1215   GFRNONAA 7 (L) 09/15/2014 0948   GFRNONAA 6 (L) 05/31/2014 0432   GFRAA 11 (L) 05/22/2020 1215   GFRAA 8 (L) 09/15/2014 0948   GFRAA 7 (L) 05/31/2014 0432    Assessment/Planning: The patient is a 59 year old female with multiple medical issues including end-stage renal disease with a functioning right upper extremity hero graft who presents to the ED with chest pain and shortness of breath  1) End-Stage Renal Disease: Patient has a functioning right upper extremity hero graft.  The patient is no longer in need of her PermCath.  We will plan on removal of PermCath tomorrow in our angiography suite.  Very hypersensitive to the right groin/thigh pain and we will use conscious sedation when removing the PermCath with minimal discomfort to the patient.  Procedure, risks and benefits were explained to the patient.  All questions were answered.  The patient wishes to move forward.  Discussed with Dr. Eber Hong Adonai Helzer PA-C 05/22/2020 3:04 PM

## 2020-05-22 NOTE — Progress Notes (Signed)
Hemodialysis patient known at [place] 6:00am. Patient rides with [facility]. Please contact me with dialysis placement concerns.  Elvera Bicker Dialysis  Coordinator [phone]

## 2020-05-22 NOTE — Progress Notes (Signed)
Hd completed 

## 2020-05-22 NOTE — ED Provider Notes (Signed)
[facility] Emergency Department Provider Note  ____________________________________________   First MD Initiated Contact with Patient 05/22/20 [phone]     (approximate)  I have reviewed the triage vital signs and the nursing notes.   HISTORY  Chief Complaint Chest Pain    HPI [name] is a 59 y.o. female with extensive past medical history as below including diabetes, hypertension, hyperlipidemia, end-stage renal disease, here with acute onset of left-sided chest pain.  The patient states she was in her usual state of health when she went to dialysis today.  During dialysis, she began to experience acute onset of severe, pressure-like, crushing, left-sided chest pressure.  She had associated shortness of breath and cough.  She has been coughing since then.  She denies any preceding cough.  She did vomit once with the pain as well, which is new for her.  She denies any preceding abdominal pain.  Nothing new or abnormal for breakfast.  She went to dialysis on Saturday.  She felt fine prior to being hooked up to dialysis.  This is not common for her during her sessions.  No other acute complaints.        Past Medical History:  Diagnosis Date  . Anemia   . Anginal pain ([place])   . Anxiety   . Arthritis   . Asthma   . Broken wrist   . Bronchitis   . chronic diastolic CHF 0/04/6760  . Chronic kidney disease   . COPD (chronic obstructive pulmonary disease) ([place])   . Coronary artery disease    a. cath 2013: stenting to RCA (report not available); b. cath 2014: LM nl, pLAD 40%, mLAD nl, ost LCx 40%, mid LCx nl, pRCA 30% @ site of prior stent, mRCA 50%  . Depression   . Diabetes mellitus ([place])   . Diabetes mellitus without complication ([place])   . Diabetic neuropathy ([place])   . dialysis 2006  . Diverticulosis   . Dizziness   . Dyspnea   . Elevated lipids   . Environmental and seasonal allergies   . ESRD (end stage renal disease) on dialysis ([place])    M-W-F   . Gastroparesis   . GERD (gastroesophageal reflux disease)   . Headache   . History of anemia due to chronic kidney disease   . History of hiatal hernia   . HOH (hard of hearing)   . Hx of pancreatitis 2015  . Hypertension   . Lower extremity edema   . Mitral regurgitation    a. echo 10/2013: EF 62%, noWMA, mildly dilated LA, mild to mod MR/TR, GR1DD  . Myocardial infarction ([place])   . Orthopnea   . Parathyroid abnormality ([place])   . Peripheral arterial disease ([place])   . Pneumonia   . Renal cancer ([place])   . Renal insufficiency    Pt is on dialysis on M,W + F.  . Wheezing     Patient Active Problem List   Diagnosis Date Noted  . Arm pain 05/21/2020  . Cellulitis of right leg 05/02/2020  . ESRD (end stage renal disease) ([place]) 04/13/2020  . Hematoma of arm, left, subsequent encounter 11/24/2019  . Respiratory failure ([place])   . Shortness of breath   . Complication of arteriovenous dialysis fistula 11/13/2019  . PVD (peripheral vascular disease) ([place]) 11/13/2019  . Severe anemia 11/13/2019  . Pressure injury of skin 11/13/2019  . ESRD on hemodialysis ([place])   . Hemorrhagic shock ([place]) 11/11/2019  . Leg pain 10/02/2019  .  CAP (community acquired pneumonia) 10/01/2019  . Atherosclerosis of native arteries of extremity with intermittent claudication ([place]) 05/01/2019  . Right-sided headache   . COPD (chronic obstructive pulmonary disease) ([place]) 10/06/2018  . Syncope 10/04/2018  . Symptomatic anemia 06/18/2018  . Gastroparesis due to DM ([place]) 01/18/2018  . Complication of vascular access for dialysis 12/04/2017  . Osteomyelitis ([place]) 09/30/2017  . Carotid stenosis 06/18/2017  . Bilateral carotid artery stenosis 06/16/2017  . Benign essential HTN 05/19/2017  . CKD (chronic kidney disease) stage 5, GFR less than 15 ml/min (HCC) 05/19/2017  . Hyperlipidemia, mixed 05/19/2017  . Shortness of breath 05/04/2017  . Rash, skin 12/16/2016  . Cellulitis of lower extremity 07/29/2016   . Chronic venous insufficiency 07/29/2016  . Lymphedema 07/29/2016  . TIA (transient ischemic attack) 04/21/2016  . Altered mental status 04/08/2016  . Hyperammonemia ([place]) 04/08/2016  . Elevated troponin 04/08/2016  . Depression 04/08/2016  . Depression, major, recurrent, severe with psychosis ([place]) 04/08/2016  . Blood in stool   . Intractable cyclical vomiting with nausea   . Reflux esophagitis   . Gastritis   . Generalized abdominal pain   . Uncontrollable vomiting   . Major depressive disorder, recurrent episode, moderate ([place]) 03/15/2016  . Adjustment disorder with mixed anxiety and depressed mood 03/15/2016  . Malnutrition of moderate degree 12/01/2015  . Renal mass   . Dyspnea   . Acute renal failure ([place])   . Respiratory failure ([place])   . High temperature 11/14/2015  . Pulmonary edema   . Encounter for central line placement   . Encounter for orogastric (OG) tube placement   . Nausea 11/12/2015  . Hyperkalemia 10/03/2015  . Diarrhea, unspecified 07/22/2015  . Pneumonia 05/21/2015  . Hypoglycemia 04/24/2015  . Unresponsiveness 04/24/2015  . Bradycardia 04/24/2015  . Hypothermia 04/24/2015  . Acute respiratory failure ([place]) 04/24/2015  . Acute diastolic CHF (congestive heart failure) ([place]) 04/05/2015  . Diabetic gastroparesis ([place]) 04/05/2015  . Hypokalemia 04/05/2015  . Generalized weakness 04/05/2015  . Acute pulmonary edema ([place]) 04/03/2015  . Nausea and vomiting 04/03/2015  . Hypoglycemia associated with diabetes ([place]) 04/03/2015  . Anemia of chronic disease 04/03/2015  . Secondary hyperparathyroidism ([place]) 04/03/2015  . Pressure ulcer 04/02/2015  . Acute respiratory failure with hypoxia ([place]) 04/01/2015  . Adjustment disorder with anxiety 03/14/2015  . Somatic symptom disorder, mild 03/08/2015  . Coronary artery disease involving native coronary artery of native heart without angina pectoris   . Nausea & vomiting 03/06/2015  . Abdominal pain 03/06/2015   . DM (diabetes mellitus) ([place]) 03/06/2015  . HTN (hypertension) 03/06/2015  . Gastroparesis 02/24/2015  . Pleural effusion 02/19/2015  . HCAP (healthcare-associated pneumonia) 02/19/2015  . End-stage renal disease on hemodialysis ([place]) 02/19/2015    Past Surgical History:  Procedure Laterality Date  . A/V SHUNTOGRAM Left 01/20/2018   Procedure: A/V SHUNTOGRAM;  Surgeon: Algernon Huxley, MD;  Location: [place] [facility];  Service: Cardiovascular;  Laterality: Left;  . ABDOMINAL HYSTERECTOMY  1992  . AMPUTATION TOE Left 10/02/2017   Procedure: AMPUTATION TOE-LEFT GREAT TOE;  Surgeon: Albertine Patricia, DPM;  Location: [facility];  Service: Podiatry;  Laterality: Left;  . APPENDECTOMY    . APPLICATION OF WOUND VAC N/A 11/25/2019   Procedure: APPLICATION OF WOUND VAC;  Surgeon: Katha Cabal, MD;  Location: [facility];  Service: Vascular;  Laterality: N/A;  . ARTERY BIOPSY Right 10/11/2018   Procedure: BIOPSY TEMPORAL ARTERY;  Surgeon: Vickie Epley, MD;  Location: [facility];  Service: General;  Laterality: Right;  . CARDIAC CATHETERIZATION Left 07/26/2015   Procedure: Left Heart Cath and Coronary Angiography;  Surgeon: Dionisio David, MD;  Location: [place] [facility];  Service: Cardiovascular;  Laterality: Left;  . CATARACT EXTRACTION W/ INTRAOCULAR LENS IMPLANT Right   . CATARACT EXTRACTION W/PHACO Left 03/10/2017   Procedure: CATARACT EXTRACTION PHACO AND INTRAOCULAR LENS PLACEMENT (IOC);  Surgeon: Birder Robson, MD;  Location: [facility];  Service: Ophthalmology;  Laterality: Left;  [place] 00:51.9 AP% 14.2 CDE 7.39 fluid pack lot # [id number]  . CENTRAL LINE INSERTION Right 11/11/2019   Procedure: CENTRAL LINE INSERTION;  Surgeon: Katha Cabal, MD;  Location: [facility];  Service: Vascular;  Laterality: Right;  . CENTRAL LINE INSERTION  11/25/2019   Procedure: CENTRAL LINE INSERTION;  Surgeon: Katha Cabal, MD;  Location: [facility];  Service: Vascular;;  . CHOLECYSTECTOMY    .  COLONOSCOPY WITH PROPOFOL N/A 08/12/2016   Procedure: COLONOSCOPY WITH PROPOFOL;  Surgeon: Lollie Sails, MD;  Location: [facility] ENDOSCOPY;  Service: Endoscopy;  Laterality: N/A;  . DIALYSIS FISTULA CREATION Left    upper arm  . dialysis grafts    . DIALYSIS/PERMA CATHETER INSERTION N/A 11/14/2019   Procedure: DIALYSIS/PERMA CATHETER INSERTION;  Surgeon: Algernon Huxley, MD;  Location: [place] [facility];  Service: Cardiovascular;  Laterality: N/A;  . DIALYSIS/PERMA CATHETER INSERTION N/A 02/03/2020   Procedure: DIALYSIS/PERMA CATHETER INSERTION;  Surgeon: Katha Cabal, MD;  Location: [place] [facility];  Service: Cardiovascular;  Laterality: N/A;  . ESOPHAGOGASTRODUODENOSCOPY N/A 03/08/2015   Procedure: ESOPHAGOGASTRODUODENOSCOPY (EGD);  Surgeon: Manya Silvas, MD;  Location: [facility] ENDOSCOPY;  Service: Endoscopy;  Laterality: N/A;  . ESOPHAGOGASTRODUODENOSCOPY (EGD) WITH PROPOFOL N/A 03/18/2016   Procedure: ESOPHAGOGASTRODUODENOSCOPY (EGD) WITH PROPOFOL;  Surgeon: [name], MD;  Location: [facility];  Service: Endoscopy;  Laterality: N/A;  . EYE SURGERY Right 2018  . FECAL TRANSPLANT N/A 08/23/2015   Procedure: FECAL TRANSPLANT;  Surgeon: Manya Silvas, MD;  Location[facility];  Service: Endoscopy;  Laterality: N/A;  . HAND SURGERY Bilateral   . HEMATOMA EVACUATION Left 11/25/2019   Procedure: EVACUATION HEMATOMA;  Surgeon: Katha Cabal, MD;  Location: [facility];  Service: Vascular;  Laterality: Left;  . I & D EXTREMITY Left 11/25/2019   Procedure: IRRIGATION AND DEBRIDEMENT EXTREMITY;  Surgeon: Katha Cabal, MD;  Location: [facility];  Service: Vascular;  Laterality: Left;  . IR FLUORO GUIDE CV LINE RIGHT  04/06/2020  . IR RADIOLOGIST EVAL & MGMT  07/28/2019  . IR RADIOLOGIST EVAL & MGMT  08/11/2019  . LIGATION OF ARTERIOVENOUS  FISTULA Left 11/11/2019  . LIGATION OF ARTERIOVENOUS  FISTULA Left 11/11/2019   Procedure: LIGATION OF ARTERIOVENOUS  FISTULA;  Surgeon:  Katha Cabal, MD;  Location: [facility];  Service: Vascular;  Laterality: Left;  . PERIPHERAL VASCULAR CATHETERIZATION N/A 12/20/2015   Procedure: Thrombectomy of dialysis access versus permcath placement;  Surgeon: Algernon Huxley, MD;  Location: [place] [facility];  Service: Cardiovascular;  Laterality: N/A;  . PERIPHERAL VASCULAR CATHETERIZATION N/A 12/20/2015   Procedure: A/V Shunt Intervention;  Surgeon: Algernon Huxley, MD;  Location: [place] [facility];  Service: Cardiovascular;  Laterality: N/A;  . PERIPHERAL VASCULAR CATHETERIZATION N/A 12/20/2015   Procedure: A/V Shuntogram/Fistulagram;  Surgeon: Algernon Huxley, MD;  Location: [place] [facility];  Service: Cardiovascular;  Laterality: N/A;  . PERIPHERAL VASCULAR CATHETERIZATION N/A 01/02/2016   Procedure: A/V Shuntogram/Fistulagram;  Surgeon: Algernon Huxley, MD;  Location: [place] [facility];  Service: Cardiovascular;  Laterality: N/A;  . PERIPHERAL VASCULAR CATHETERIZATION N/A 01/02/2016   Procedure: A/V Shunt Intervention;  Surgeon: Algernon Huxley, MD;  Location: [place] [facility];  Service: Cardiovascular;  Laterality: N/A;  . UPPER EXTREMITY VENOGRAPHY Right 01/18/2020   Procedure: UPPER EXTREMITY VENOGRAPHY;  Surgeon: Katha Cabal, MD;  Location: [place] [facility];  Service: Cardiovascular;  Laterality: Right;  Marland Kitchen VASCULAR ACCESS DEVICE INSERTION Right 04/13/2020   Procedure: INSERTION OF HERO VASCULAR ACCESS DEVICE (GRAFT);  Surgeon: Katha Cabal, MD;  Location: [facility];  Service: Vascular;  Laterality: Right;    Prior to Admission medications   Medication Sig Start Date End Date Taking? Authorizing Provider  albuterol (PROVENTIL) (2.5 MG/3ML) 0.083% nebulizer solution Take 2.5 mg by nebulization every 4 (four) hours as needed for wheezing or shortness of breath.    [provider]  albuterol (VENTOLIN HFA) 108 (90 Base) MCG/ACT inhaler Inhale 2 puffs into the lungs every 4 (four) hours as needed for wheezing or  shortness of breath. 11/09/19   [provider]  amLODipine (NORVASC) 10 MG tablet Take 10 mg by mouth daily.  10/24/19   [provider]  ammonium lactate (LAC-HYDRIN) 12 % lotion Apply 1 application topically in the morning and at bedtime.     [provider]  Ascorbic Acid (VITA-C PO) Take 1 tablet by mouth daily.    [provider]  aspirin EC 81 MG tablet Take 81 mg by mouth daily.    [provider]  atorvastatin (LIPITOR) 20 MG tablet Take 20 mg by mouth every evening.  10/24/19   [provider]  calcium acetate (PHOSLO) 667 MG capsule TAKE 3 CAPSULES BY MOUTH 3 TIMES A DAY BEFORE MEALS AND 1 WITH SNACKS 04/10/20   [provider]  carvedilol (COREG) 6.25 MG tablet Take 6.25 mg by mouth 2 (two) times daily. Patient not taking: Reported on 05/04/2020 10/24/19   [provider]  Cholecalciferol (VITAMIN D3) 25 MCG (1000 UT) CAPS Take 1,000 Units by mouth daily.     [provider]  cinacalcet (SENSIPAR) 30 MG tablet Take 30 mg by mouth daily. Patient not taking: Reported on 05/02/2020    [provider]  citalopram (CELEXA) 20 MG tablet Take 20 mg by mouth daily. Patient not taking: Reported on 05/02/2020 10/29/19   [provider]  clopidogrel (PLAVIX) 75 MG tablet Take 75 mg by mouth at bedtime.  Patient not taking: Reported on 05/07/2020 07/01/19   [provider]  COMBIVENT RESPIMAT 20-100 MCG/ACT AERS respimat Inhale 2 puffs into the lungs every 6 (six) hours as needed for wheezing or shortness of breath. Shortness of breath Patient not taking: Reported on 05/02/2020 11/30/19   [provider]  CREON 12000 units CPEP capsule Take 12,000 Units by mouth See admin instructions. Take 36,000 with meals and 24,000 with snacks three times a day 10/21/19   [provider]  cyanocobalamin 1000 MCG tablet Take 1,000 mcg by mouth daily.     [provider]  diclofenac sodium  (VOLTAREN) 1 % GEL Apply 1 application topically in the morning and at bedtime.     [provider]  diphenhydrAMINE (BENADRYL) 25 MG tablet Take 25 mg by mouth every 6 (six) hours as needed for itching.    [provider]  Ferrous Sulfate (IRON) 325 (65 Fe) MG TABS Take 325 mg by mouth daily.     [provider]  gabapentin (NEURONTIN) 100 MG capsule Take 100 mg by mouth 2 (two) times daily.  10/29/19   [provider]  HYDROcodone-acetaminophen (NORCO/VICODIN) 5-325 MG tablet Take 1 tablet by mouth 4 (four) times daily as needed for moderate pain.  10/20/19   [provider]  hydrOXYzine (ATARAX/VISTARIL) 25 MG tablet Take 25 mg by mouth See admin instructions. Take on Tues, Thurs, and sat after dialysis Monday Wednesday Friday and Sunday daily in the morning    [provider]  insulin aspart (NOVOLOG) 100 UNIT/ML injection Inject 4-15 Units into the skin in the morning, at noon, in the evening, and at bedtime. Sliding scale    [provider]  insulin detemir (LEVEMIR) 100 UNIT/ML injection Inject 67 Units into the skin at bedtime.    [provider]  meclizine (ANTIVERT) 25 MG tablet Take 12.5 mg by mouth every 6 (six) hours.  11/01/19   [provider]  multivitamin (RENA-VIT) TABS tablet Take 1 tablet by mouth daily.     [provider]  mupirocin ointment (BACTROBAN) 2 % Apply 1 application topically daily as needed (leg rash).  11/09/19   [provider]  nitroGLYCERIN (NITROSTAT) 0.4 MG SL tablet Place 0.4 mg under the tongue every 5 (five) minutes as needed for chest pain.     [provider]  Omega-3 300 MG CAPS Take 300 mg by mouth 2 (two) times daily. Every other day opposite omega XL    [provider]  pantoprazole (PROTONIX) 40 MG tablet Take 40 mg by mouth daily. 08/28/19   [provider]  Probiotic Product (PROBIOTIC PO) Take 1 capsule by mouth daily.    [provider]  promethazine (PHENERGAN) 12.5 MG tablet Take 1 tablet (12.5 mg total) by mouth every 8 (eight) hours as needed for nausea. 11/17/19   Lorella Nimrod, MD  sevelamer carbonate (RENVELA) 800 MG tablet Take 800-2,400 mg by mouth See admin instructions. Take 2400 mg by mouth 3 times daily with meals and take [phone] mg by mouth with snacks    [provider]  traMADol (ULTRAM) 50 MG tablet Take 1 tablet (50 mg total) by mouth every 8 (eight) hours as needed for moderate pain or severe pain. 05/21/20   Schnier, Dolores Lory, MD  Zinc 50 MG CAPS Take 50 mg by mouth daily.  Patient not taking: Reported on 05/07/2020    [provider]    Allergies Ace inhibitors, Ativan [lorazepam], Compazine [prochlorperazine edisylate], Sumatriptan succinate, Zofran [ondansetron], Lac bovis, Losartan, Prochlorperazine, Reglan [metoclopramide], Scopolamine, Tape, and Tapentadol  Family History  Problem Relation Age of Onset  . Kidney disease Mother   . Diabetes Mother   . Cancer Father   . Kidney disease Sister     Social History Social History   Tobacco Use  . Smoking status: Never Smoker  . Smokeless tobacco: Never Used  Vaping Use  . Vaping Use: Never used  Substance Use Topics  . Alcohol use: Not Currently    Comment: glass wine week per pt  . Drug use: Yes    Types: Marijuana    Comment: once a day    Review of Systems  Review of Systems  Constitutional: Positive for fatigue. Negative for fever.  HENT: Negative for congestion and sore throat.   Eyes: Negative for visual disturbance.  Respiratory: Positive for chest tightness. Negative for cough and shortness of breath.   Cardiovascular: Positive for chest pain.  Gastrointestinal: Negative for abdominal pain,  diarrhea, nausea and vomiting.  Genitourinary: Negative for flank pain.  Musculoskeletal: Negative for back pain and neck pain.  Skin: Negative for rash and wound.  Neurological: Negative for weakness.  All  other systems reviewed and are negative.    ____________________________________________  PHYSICAL EXAM:      VITAL SIGNS: ED Triage Vitals  Enc Vitals Group     BP 05/22/20 0828 (!) 155/67     Pulse Rate 05/22/20 0828 66     Resp 05/22/20 0828 16     Temp 05/22/20 0828 97.8 F (36.6 C)     Temp Source 05/22/20 0828 Oral     SpO2 05/22/20 0828 99 %     Weight 05/22/20 0837 184 lb (83.5 kg)     Height 05/22/20 0837 5\' 5"  (1.651 m)     Head Circumference --      Peak Flow --      Pain Score 05/22/20 0837 10     Pain Loc --      Pain Edu? --      Excl. in [place]? --      Physical Exam Vitals and nursing note reviewed.  Constitutional:      General: She is not in acute distress.    Appearance: She is well-developed.  HENT:     Head: Normocephalic and atraumatic.  Eyes:     Conjunctiva/sclera: Conjunctivae normal.  Cardiovascular:     Rate and Rhythm: Normal rate and regular rhythm.     Heart sounds: Normal heart sounds. No murmur heard.  No friction rub.  Pulmonary:     Effort: Pulmonary effort is normal. No respiratory distress.     Breath sounds: Normal breath sounds. No wheezing or rales.  Abdominal:     General: There is no distension.     Palpations: Abdomen is soft.     Tenderness: There is no abdominal tenderness.  Musculoskeletal:     Cervical back: Neck supple.  Skin:    General: Skin is warm.     Capillary Refill: Capillary refill takes less than 2 seconds.  Neurological:     Mental Status: She is alert and oriented to person, place, and time.     Motor: No abnormal muscle tone.       ____________________________________________   LABS (all labs ordered are listed, but only abnormal results are displayed)  Labs Reviewed  CBC WITH DIFFERENTIAL/PLATELET - Abnormal; Notable for the following components:      Result Value   RBC 3.07 (*)    Hemoglobin 9.1 (*)    HCT 28.6 (*)    Platelets 141 (*)    All other components within normal limits   COMPREHENSIVE METABOLIC PANEL - Abnormal; Notable for the following components:   Sodium 133 (*)    Creatinine, Ser 4.34 (*)    Calcium 8.2 (*)    Total Bilirubin 1.3 (*)    GFR calc non Af Amer 10 (*)    GFR calc Af Amer 12 (*)    All other components within normal limits  BRAIN NATRIURETIC PEPTIDE - Abnormal; Notable for the following components:   B Natriuretic Peptide 620.7 (*)    All other components within normal limits  SARS CORONAVIRUS 2 BY RT PCR (HOSPITAL ORDER, [place] [facility])  TROPONIN I (HIGH SENSITIVITY)    ____________________________________________  EKG: Normal sinus rhythm, ventricular rate 67.  PR 170, QRS 94, QTc 44.  Low voltage, no acute ST elevations or depressions.  No  EKG evidence of acute ischemia or infarct. ________________________________________  RADIOLOGY All imaging, including plain films, CT scans, and ultrasounds, independently reviewed by me, and interpretations confirmed via formal radiology reads.  ED MD interpretation:   Chest x-ray: Recurrent pulmonary edema, no acute findings  Official radiology report(s): DG Chest Portable 1 View  Result Date: 05/22/2020 CLINICAL DATA:  Severe chest pain EXAM: PORTABLE CHEST 1 VIEW COMPARISON:  11/15/2019 FINDINGS: Diffuse interstitial opacity similar to prior. Chronic cardiomegaly. Probable trace pleural effusions. Dialysis catheters from above and below on the right. Left-sided venous stenting. IMPRESSION: Chronic/recurrent pulmonary edema and trace pleural effusions. No acute finding when compared to prior. Electronically Signed   By: Monte Fantasia M.D.   On: 05/22/2020 09:03    ____________________________________________  PROCEDURES   Procedure(s) performed (including Critical Care):  Ultrasound ED Peripheral IV (Provider)  Date/Time: 05/22/2020 9:24 AM Performed by: Duffy Bruce, MD Authorized by: Duffy Bruce, MD   Procedure details:    Indications: multiple  failed IV attempts     Skin Prep: chlorhexidine gluconate     Location:  Right forearm   Angiocath:  20 G   Bedside Ultrasound Guided: Yes     Images: archived     Patient tolerated procedure without complications: Yes     Dressing applied: Yes      ____________________________________________  INITIAL IMPRESSION / MDM / [place] / ED COURSE  As part of my medical decision making, I reviewed the following data within the [place] notes reviewed and incorporated, Old chart reviewed, Notes from prior ED visits, and [place] Controlled Substance Database       *[name] was evaluated in Emergency Department on 05/22/2020 for the symptoms described in the history of present illness. She was evaluated in the context of the global COVID-19 pandemic, which necessitated consideration that the patient might be at risk for infection with the SARS-CoV-2 virus that causes COVID-19. Institutional protocols and algorithms that pertain to the evaluation of patients at risk for COVID-19 are in a state of rapid change based on information released by regulatory bodies including the CDC and federal and state organizations. These policies and algorithms were followed during the patient's care in the ED.  Some ED evaluations and interventions may be delayed as a result of limited staffing during the pandemic.*     Medical Decision Making:  59 yo F here with cough, CP, SOB during HD session. Only received 1.5 hr of session. On exam, pt coughing with b/l rales, active CP. EKG is nonischemic and trop neg. CXR shows pulm edema, and BNP above baseline. Pt reports she has made no UOP x 2 days which is abnormal fr her. She has recently undergone multiple hospitalizations and AV fistula revisions. Discussed with Dr. Juleen China. Pt will need to complete HD session, will admit for obs as well given ongoing worsening CP, tachypnea with CP and known h/o CAD with MI x  2.  ____________________________________________  FINAL CLINICAL IMPRESSION(S) / ED DIAGNOSES  Final diagnoses:  Atypical chest pain  Acute pulmonary edema (HCC)  ESRD (end stage renal disease) ([place])     MEDICATIONS GIVEN DURING THIS VISIT:  Medications  fentaNYL (SUBLIMAZE) injection 50 mcg (50 mcg Intravenous Given 05/22/20 0856)  promethazine (PHENERGAN) injection 12.5 mg (12.5 mg Intravenous Given 05/22/20 0900)     ED Discharge Orders    None       Note:  This document was prepared using Dragon voice recognition software and  may include unintentional dictation errors.   Duffy Bruce, MD 05/22/20 [phone]

## 2020-05-23 ENCOUNTER — Other Ambulatory Visit (INDEPENDENT_AMBULATORY_CARE_PROVIDER_SITE_OTHER): Payer: Self-pay | Admitting: Vascular Surgery

## 2020-05-23 DIAGNOSIS — J810 Acute pulmonary edema: Secondary | ICD-10-CM | POA: Diagnosis not present

## 2020-05-23 DIAGNOSIS — N186 End stage renal disease: Secondary | ICD-10-CM | POA: Diagnosis not present

## 2020-05-23 DIAGNOSIS — I120 Hypertensive chronic kidney disease with stage 5 chronic kidney disease or end stage renal disease: Secondary | ICD-10-CM | POA: Diagnosis not present

## 2020-05-23 DIAGNOSIS — D631 Anemia in chronic kidney disease: Secondary | ICD-10-CM | POA: Diagnosis not present

## 2020-05-23 DIAGNOSIS — R0789 Other chest pain: Secondary | ICD-10-CM | POA: Diagnosis not present

## 2020-05-23 DIAGNOSIS — N2581 Secondary hyperparathyroidism of renal origin: Secondary | ICD-10-CM | POA: Diagnosis not present

## 2020-05-23 LAB — COMPREHENSIVE METABOLIC PANEL
ALT: 10 U/L (ref 0–44)
AST: 16 U/L (ref 15–41)
Albumin: 3.3 g/dL — ABNORMAL LOW (ref 3.5–5.0)
Alkaline Phosphatase: 66 U/L (ref 38–126)
Anion gap: 7 (ref 5–15)
BUN: 11 mg/dL (ref 6–20)
CO2: 29 mmol/L (ref 22–32)
Calcium: 8.3 mg/dL — ABNORMAL LOW (ref 8.9–10.3)
Chloride: 100 mmol/L (ref 98–111)
Creatinine, Ser: 3.92 mg/dL — ABNORMAL HIGH (ref 0.44–1.00)
GFR calc Af Amer: 14 mL/min — ABNORMAL LOW (ref >60)
GFR calc non Af Amer: 12 mL/min — ABNORMAL LOW (ref >60)
Glucose, Bld: 82 mg/dL (ref 70–99)
Potassium: 4.3 mmol/L (ref 3.5–5.1)
Sodium: 136 mmol/L (ref 135–145)
Total Bilirubin: 0.9 mg/dL (ref 0.3–1.2)
Total Protein: 6.5 g/dL (ref 6.5–8.1)

## 2020-05-23 LAB — TROPONIN I (HIGH SENSITIVITY)
Troponin I (High Sensitivity): 5 ng/L (ref <18)
Troponin I (High Sensitivity): 5 ng/L (ref <18)

## 2020-05-23 LAB — CBC
HCT: 25.7 % — ABNORMAL LOW (ref 36.0–46.0)
Hemoglobin: 8.6 g/dL — ABNORMAL LOW (ref 12.0–15.0)
MCH: 30 pg (ref 26.0–34.0)
MCHC: 33.5 g/dL (ref 30.0–36.0)
MCV: 89.5 fL (ref 80.0–100.0)
Platelets: 128 10*3/uL — ABNORMAL LOW (ref 150–400)
RBC: 2.87 MIL/uL — ABNORMAL LOW (ref 3.87–5.11)
RDW: 15.1 % (ref 11.5–15.5)
WBC: 4.1 10*3/uL (ref 4.0–10.5)
nRBC: 0 % (ref 0.0–0.2)

## 2020-05-23 LAB — GLUCOSE, CAPILLARY
Glucose-Capillary: 89 mg/dL (ref 70–99)
Glucose-Capillary: 102 mg/dL — ABNORMAL HIGH (ref 70–99)

## 2020-05-23 SURGERY — DIALYSIS/PERMA CATHETER REMOVAL
Anesthesia: Moderate Sedation

## 2020-05-23 MED ORDER — CALCIUM ACETATE (PHOS BINDER) 667 MG PO CAPS
2001.0000 mg | ORAL_CAPSULE | Freq: Three times a day (TID) | ORAL | Status: DC
  Filled 2020-05-23 (×2): qty 3

## 2020-05-23 MED ORDER — CALCIUM ACETATE (PHOS BINDER) 667 MG PO CAPS
667.0000 mg | ORAL_CAPSULE | ORAL | Status: DC
  Filled 2020-05-23 (×3): qty 1

## 2020-05-23 MED ORDER — SEVELAMER CARBONATE 800 MG PO TABS
800.0000 mg | ORAL_TABLET | Freq: Three times a day (TID) | ORAL | Status: DC
  Administered 2020-05-23: 800 mg via ORAL
  Filled 2020-05-23 (×4): qty 1

## 2020-05-23 MED ORDER — PANCRELIPASE (LIP-PROT-AMYL) 12000-38000 UNITS PO CPEP
12000.0000 [IU] | ORAL_CAPSULE | ORAL | Status: DC
  Filled 2020-05-23 (×3): qty 1

## 2020-05-23 MED ORDER — PANCRELIPASE (LIP-PROT-AMYL) 12000-38000 UNITS PO CPEP
36000.0000 [IU] | ORAL_CAPSULE | Freq: Three times a day (TID) | ORAL | Status: DC
  Filled 2020-05-23 (×2): qty 3

## 2020-05-23 MED ORDER — HYDRALAZINE HCL 20 MG/ML IJ SOLN: 10.0000 mg | INTRAMUSCULAR | Status: DC | PRN

## 2020-05-23 NOTE — Discharge Summary (Signed)
Physician Discharge Summary [facility]    Patient: [name]                   Admit date: 05/22/2020   DOB: Sep 13, 1961             Discharge date:05/23/2020/2:51 PM ZCH:[id number]                          PCP: [facility] [place]  Disposition: HOME   Recommendations for Outpatient Follow-up:   . Follow up: Continue to follow with your nephrologist, you are scheduled for hemodialysis tomorrow  . Vascular surgery indicated the patient's catheter can be removed on Friday under conscious sedation   Discharge Condition: Stable   Code Status:   Code Status: Full Code  Diet recommendation: Renal diet   Discharge Diagnoses:    Principal Problem:   Acute pulmonary edema ([place]) Active Problems:   DM (diabetes mellitus) ([place])   Acute diastolic CHF (congestive heart failure) ([place])   Diabetic gastroparesis ([place])   Chronic venous insufficiency   COPD (chronic obstructive pulmonary disease) ([place])   ESRD on hemodialysis ([place])   PVD (peripheral vascular disease) ([place])   Benign essential HTN   History of Present Illness/ Hospital Course [name] Summary:   [name] is an 59 y.o. female with PMH significant for ESRD on HD, HTN, DM 2, CAD, HFpEF, PVD, gastroparesis, lymphedema/venous stasis disease with multiple episodes of cellulitis and pneumonia who was in her usual state of good health until 90 minutes into dialysis this morning when she had sudden onset of chills followed by an episode of vomitus.  After vomiting patient had sudden onset of shortness of breath and chest pain.  Patient describes the chest pain as a heaviness in the chest and pressure.  Notes the chest pain and pressure radiated to her left shoulder.  This was followed by onset of shortness of breath and cough.  Cough is nonproductive and patient notes "I couldn't stop coughing".  Patient also stated that she just couldn't catch her breath but was reassured by dialysis nurse that her O2  saturations were fine.  Patient continued to complain of shortness of breath and cough and was sent to the ED.  In the ED patient was noted to be hypoxic with new 2 L oxygen requirement.   Patient states that she felt her normal usual self prior to dialysis today.  She has not been sick, denies fevers or chills.  She hasn't had recent vomiting although does admit to diabetic gastroparesis.  ED Course:  The patient was noted to be afebrile and hypertensive.  They put her on 2 L nasal cannula which made her feel better.  Was noted to have rales on exam and chest x-ray showed pulmonary edema.  Patient is now admitted for evaluation of chest pain and pulmonary edema.  The patient was subsequently admitted for close observation chest pain and shortness of breath has been resolved.... Received breathing treatments overnight, Underlying etiology such as acute MI, severe pulmonary congestion, gastroparesis, flash pulmonary edema was ruled out.   Patient is comorbidities including hypertension/diabetes mellitus type 2/coronary artery disease/COPD was addressed, home medication was reviewed..  Note:  Patient is nephrology Dr. Juleen China was consulted, patient is a status post UF of 2 L via AVG on 05/22/2020.... AVG that was placed by Dr. Delana Meyer on 7/23 Vascular team has scheduled the patient for removal of femoral PermCath -but due  to patient extensive pain and intolerance it was a scheduled to be done on Friday under conscious sedation Vascular has communicated that we will pursue with removal of perm catheter removal this Friday  -Patient expressed extreme dissatisfaction as the plan of permacatheter removal was not discussed with the patient on admission.  Also switching hemodialysis days was not communicated..   Per nephrology and vascular patient can be discharged today as no further medical needs does not meet the criteria to continue inpatient  status.  ---------------------------------------------------------------------------------------------------------------------------------------------  End Stage Renal Disease: using hero AVG. Hero AVG that was placed by Dr. Delana Meyer on 7/23.  - No indication for dialysis today.  - Vascular is scheduled to remove femoral permcath  Hypertension:Home regimen ofamlodipine, carvedilol, and hydralazine  Anemia of chronic kidney disease:hemoglobin 8.6.   - EPO with HD treatments  Secondary Hyperparathyroidism: outpatient labs 8/10 PTH 390, phos 4.4 and calcium 8.2.  - Continue sevelamer with meals.  - Continue cinacalcet.... No changes Colace has been addressed, medication has been reviewed no changes to meds at this time.   Discharge Instructions:   Discharge Instructions    Activity as tolerated - No restrictions   Complete by: As directed    Call MD for:  difficulty breathing, headache or visual disturbances   Complete by: As directed    Call MD for:  temperature >100.4   Complete by: As directed    Diet - low sodium heart healthy   Complete by: As directed    Discharge instructions   Complete by: As directed    Please follow-up with your nephrologist and hemodialysis as a scheduled tomorrow Please follow-up with the vascular surgery regarding planned discontinuation of your catheter on Friday   Increase activity slowly   Complete by: As directed        Medication List    TAKE these medications   albuterol (2.5 MG/3ML) 0.083% nebulizer solution Commonly known as: PROVENTIL Take 2.5 mg by nebulization every 4 (four) hours as needed for wheezing or shortness of breath.   albuterol 108 (90 Base) MCG/ACT inhaler Commonly known as: VENTOLIN HFA Inhale 2 puffs into the lungs every 4 (four) hours as needed for wheezing or shortness of breath.   ammonium lactate 12 % lotion Commonly known as: LAC-HYDRIN Apply 1 application topically 2 (two) times daily as needed for dry  skin.   aspirin EC 81 MG tablet Take 81 mg by mouth daily.   atorvastatin 20 MG tablet Commonly known as: LIPITOR Take 20 mg by mouth every evening.   calcium acetate 667 MG capsule Commonly known as: PHOSLO Take 667-2,001 mg by mouth See admin instructions. Take 3 capsules (2001mg ) by mouth three times a day with meals and take 1 capsule (667mg ) by mouth daily with a snack   carvedilol 6.25 MG tablet Commonly known as: COREG Take 6.25 mg by mouth 2 (two) times daily.   cholestyramine light 4 g packet Commonly known as: PREVALITE Take 4 g by mouth 2 (two) times daily.   Creon 12000-38000 units Cpep capsule Generic drug: lipase/protease/amylase Take 12,000-36,000 Units by mouth See admin instructions. Take 3 capsules (36,000u) by mouth three times daily with meals and take 1 capsule (12,000u) by mouth daily with a snack   cyanocobalamin 1000 MCG tablet Take 1,000 mcg by mouth daily.   diphenhydrAMINE 25 MG tablet Commonly known as: BENADRYL Take 25 mg by mouth every 6 (six) hours as needed for itching.   furosemide 80 MG tablet Commonly known as:  LASIX Take 80 mg by mouth daily.   gabapentin 100 MG capsule Commonly known as: NEURONTIN Take 100 mg by mouth every evening.   HYDROcodone-acetaminophen 5-325 MG tablet Commonly known as: NORCO/VICODIN Take 1 tablet by mouth 4 (four) times daily as needed for moderate pain or severe pain.   hydrOXYzine 25 MG tablet Commonly known as: ATARAX/VISTARIL Take 25 mg by mouth daily.   insulin aspart 100 UNIT/ML injection Commonly known as: novoLOG Inject 4-15 Units into the skin in the morning, at noon, in the evening, and at bedtime. Sliding scale   Iron 325 (65 Fe) MG Tabs Take 325 mg by mouth daily.   Levemir 100 UNIT/ML injection Generic drug: insulin detemir Inject 67 Units into the skin at bedtime.   meclizine 25 MG tablet Commonly known as: ANTIVERT Take 25 mg by mouth every 6 (six) hours.   multivitamin Tabs  tablet Take 1 tablet by mouth daily.   mupirocin ointment 2 % Commonly known as: BACTROBAN Apply 1 application topically daily as needed (leg rash).   nitroGLYCERIN 0.4 MG SL tablet Commonly known as: NITROSTAT Place 0.4 mg under the tongue every 5 (five) minutes as needed for chest pain.   Omega-3 300 MG Caps Take 300 mg by mouth 2 (two) times daily. Every other day opposite omega XL   pantoprazole 40 MG tablet Commonly known as: PROTONIX Take 40 mg by mouth daily.   PROBIOTIC PO Take 1 capsule by mouth daily.   sevelamer carbonate 800 MG tablet Commonly known as: RENVELA Take 800 mg by mouth 4 (four) times daily. (at meals and with a snack)   traMADol 50 MG tablet Commonly known as: ULTRAM Take 1 tablet (50 mg total) by mouth every 8 (eight) hours as needed for moderate pain or severe pain.   VITA-C PO Take 1 tablet by mouth daily.   Vitamin D3 25 MCG (1000 UT) Caps Take 1,000 Units by mouth daily.       Follow-up Information    [facility] Follow up on 06/08/2020.   Specialty: Cardiology Why: at 10:30am. Enter through the [place] entrance Contact information: [address] [place]             Allergies  Allergen Reactions  . Ace Inhibitors Swelling and Anaphylaxis  . Ativan [Lorazepam] Other (See Comments)    Reaction:Hallucinations and headaches  . Compazine [Prochlorperazine Edisylate] Anaphylaxis, Nausea And Vomiting and Other (See Comments)    Other reaction(s): dystonia from this vs. Reglan, 23 Jul - patient relates that she takes promethazine frequently with no problems  . Sumatriptan Succinate Other (See Comments)    Other reaction(s): delirium and hallucinations per [facility] records  . Zofran [Ondansetron] Nausea And Vomiting    Per pt. she is allergic to zofran or will experience adverse reaction like hallucination   . [place] Nausea And  Vomiting  . Losartan Nausea Only  . Prochlorperazine Other (See Comments)    Reaction:  Unknown . Patient does not remember reaction but she does have vertigo and anxiety along with n and v at times. Could be used to treat any of these   . Reglan [Metoclopramide] Other (See Comments)    Per patient her Dr. Evelina Bucy her off it   . Scopolamine Other (See Comments)    Dizziness, also has vertigo already  . Tape Rash    Plastic tape causes rash  . Tapentadol Rash  Procedures /Studies:   CT Head Wo Contrast  Result Date: 05/02/2020 CLINICAL DATA:  Headache, classic migraine EXAM: CT HEAD WITHOUT CONTRAST TECHNIQUE: Contiguous axial images were obtained from the base of the skull through the vertex without intravenous contrast. COMPARISON:  10/04/2018 FINDINGS: Brain: No acute intracranial abnormality. Specifically, no hemorrhage, hydrocephalus, mass lesion, acute infarction, or significant intracranial injury. Vascular: No hyperdense vessel or unexpected calcification. Skull: No acute calvarial abnormality. Sinuses/Orbits: Visualized paranasal sinuses and mastoids clear. Orbital soft tissues unremarkable. Other: None IMPRESSION: No acute intracranial abnormality. Electronically Signed   By: Rolm Baptise M.D.   On: 05/02/2020 18:39   CT ANGIO CHEST PE W OR WO CONTRAST  Result Date: 05/22/2020 CLINICAL DATA:  Shortness of breath. EXAM: CT ANGIOGRAPHY CHEST WITH CONTRAST TECHNIQUE: Multidetector CT imaging of the chest was performed using the standard protocol during bolus administration of intravenous contrast. Multiplanar CT image reconstructions and MIPs were obtained to evaluate the vascular anatomy. CONTRAST:  62mL OMNIPAQUE IOHEXOL 350 MG/ML SOLN COMPARISON:  Feb 16, 2016. FINDINGS: Cardiovascular: Satisfactory opacification of the pulmonary arteries to the segmental level. No evidence of pulmonary embolism. Mild cardiomegaly is noted. Atherosclerosis of thoracic aorta is noted without aneurysm  or dissection. Is seen involving the left subclavian and brachiocephalic vein. Its proximal portion appears to be occluded, resulting in collateral circulations the lead to reconstitution of its more distal portion in the brachiocephalic vein. No pericardial effusion. Mediastinum/Nodes: Thyroid gland and esophagus are unremarkable. Stable enlarged mediastinal and axillary lymph nodes are noted which most likely are reactive or inflammatory in etiology. Lungs/Pleura: No pneumothorax is noted. Small bilateral pleural effusions are noted. Mild interstitial densities are noted in both lung bases concerning for pulmonary edema. Upper Abdomen: Bilateral renal atrophy is noted consistent with history of end-stage renal disease. Catheter is noted in the IVC, with distal tip just inferior to cavoatrial junction. Musculoskeletal: No chest wall abnormality. No acute or significant osseous findings. Review of the MIP images confirms the above findings. IMPRESSION: 1. No definite evidence of pulmonary embolus. 2. There is a occlusion of the proximal portion of stent graft involving the left subclavian and brachiocephalic veins. Collateral circulation is seen leading to reconstitution of its more distal portion. This most likely is a chronic finding. 3. Stable enlarged mediastinal and axillary lymph nodes are noted which most likely are reactive or inflammatory in etiology. 4. Small bilateral pleural effusions are noted. 5. Mild interstitial densities are noted in both lung bases concerning for pulmonary edema. 6. Bilateral renal atrophy is noted consistent with history of end-stage renal disease. 7. Aortic atherosclerosis. Aortic Atherosclerosis (ICD10-I70.0). Electronically Signed   By: Marijo Conception M.D.   On: 05/22/2020 12:02   DG Chest Portable 1 View  Result Date: 05/22/2020 CLINICAL DATA:  Severe chest pain EXAM: PORTABLE CHEST 1 VIEW COMPARISON:  11/15/2019 FINDINGS: Diffuse interstitial opacity similar to prior.  Chronic cardiomegaly. Probable trace pleural effusions. Dialysis catheters from above and below on the right. Left-sided venous stenting. IMPRESSION: Chronic/recurrent pulmonary edema and trace pleural effusions. No acute finding when compared to prior. Electronically Signed   By: Monte Fantasia M.D.   On: 05/22/2020 09:03   VAS US DUPLEX DIALYSIS ACCESS (AVF, AVG)  Result Date: 05/10/2020 DIALYSIS ACCESS Reason for Exam: Routine follow up. Access Site: Right Upper Extremity. Access Type: Right arm Hero graft. History: H/O AVG & outflow vein stents as well as DRIL;          08/24/13: Left brachial-axillary AVG placement;  04/13/20: Right HERO graft placement;. Performing Technologist: Blondell Reveal RT, RDMS, RVT  Examination Guidelines: A complete evaluation includes B-mode imaging, spectral Doppler, color Doppler, and power Doppler as needed of all accessible portions of each vessel. Unilateral testing is considered an integral part of a complete examination. Limited examinations for reoccurring indications may be performed as noted.  Findings:  +--------------------+----------+-----------------+----------------------------+ AVG                 PSV (cm/s)Flow Vol (mL/min)          Describe           +--------------------+----------+-----------------+----------------------------+ Native artery inflow   274          1481                                    +--------------------+----------+-----------------+----------------------------+ Arterial anastomosis   705                       0.20cm native lumen; no                                                    internal vessel narrowing   +--------------------+----------+-----------------+----------------------------+ Prox graft             237                                                  +--------------------+----------+-----------------+----------------------------+ Mid graft              225                                                   +--------------------+----------+-----------------+----------------------------+ Distal graft           254                                                  +--------------------+----------+-----------------+----------------------------+ Antegrade, biphasic flow in the right distal radial artery with velocity of 61cm/s. Post-surgical area of edema/heterogeneous hematoma noted near proximal anastomosis without obvious extrinsic compression. Old occluded right arm AVG was noted anteromedial to patent Hero graft.  Summary: Patent right arm graft with elevated velocity at the proximal anastomosis due to small lumen diameter. The remainder of the visualized graft and the Flow Volume are normal.  *See table(s) above for measurements and observations.  Diagnosing physician: Hortencia Pilar MD Electronically signed by Hortencia Pilar MD on 05/10/2020 at 5:12:53 PM.   --------------------------------------------------------------------------------   Final      Subjective:   Patient was seen and examined 05/23/2020, 2:51 PM Patient stable today. No acute distress.  No issues overnight Stable for discharge.  Discharge Exam:    Vitals:   05/22/20 1700 05/22/20 2338 05/23/20 0727 05/23/20 1211  BP: (!) 178/66 (!) 182/65 (!) 178/64 (!) 172/68  Pulse: (!) 56 62 69  68  Resp: 12 14 15 18   Temp: 98.7 F (37.1 C) 98.1 F (36.7 C)    TempSrc: Oral Oral    SpO2:  98% 97% 94%  Weight:      Height:        General: Pt lying comfortably in bed & appears in no obvious distress. Cardiovascular: S1 & S2 heard, RRR, S1/S2 +. No murmurs, rubs, gallops or clicks. No JVD or pedal edema. Respiratory: Clear to auscultation without wheezing, rhonchi or crackles. No increased work of breathing. Abdominal:  Non-distended, non-tender & soft. No organomegaly or masses appreciated. Normal bowel sounds heard. CNS: Alert and oriented. No focal deficits. Extremities: no edema, no cyanosis    The results  of significant diagnostics from this hospitalization (including imaging, microbiology, ancillary and laboratory) are listed below for reference.      Microbiology:   Recent Results (from the past 240 hour(s))  SARS Coronavirus 2 by RT PCR (hospital order, performed in [facility] hospital lab) Nasopharyngeal Nasopharyngeal Swab     Status: None   Collection Time: 05/22/20  8:03 AM   Specimen: Nasopharyngeal Swab  Result Value Ref Range Status   SARS Coronavirus 2 NEGATIVE NEGATIVE Final    Comment: (NOTE) SARS-CoV-2 target nucleic acids are NOT DETECTED.  The SARS-CoV-2 RNA is generally detectable in upper and lower respiratory specimens during the acute phase of infection. The lowest concentration of SARS-CoV-2 viral copies this assay can detect is 250 copies / mL. A negative result does not preclude SARS-CoV-2 infection and should not be used as the sole basis for treatment or other patient management decisions.  A negative result may occur with improper specimen collection / handling, submission of specimen other than nasopharyngeal swab, presence of viral mutation(s) within the areas targeted by this assay, and inadequate number of viral copies (<250 copies / mL). A negative result must be combined with clinical observations, patient history, and epidemiological information.  Fact Sheet for Patients:   StrictlyIdeas.no  Fact Sheet for Healthcare Providers: BankingDealers.co.za  This test is not yet approved or  cleared by the [place] FDA and has been authorized for detection and/or diagnosis of SARS-CoV-2 by FDA under an Emergency Use Authorization (EUA).  This EUA will remain in effect (meaning this test can be used) for the duration of the COVID-19 declaration under Section 564(b)(1) of the Act, 21 U.S.C. section 360bbb-3(b)(1), unless the authorization is terminated or revoked sooner.  Performed at [facility], [address] [place],  [address]      Labs:   CBC: Recent Labs  Lab 05/22/20 0840 05/22/20 1215 05/23/20 0506  WBC 4.1 5.8 4.1  NEUTROABS 2.2  --   --   HGB 9.1* 8.9* 8.6*  HCT 28.6* 27.3* 25.7*  MCV 93.2 89.8 89.5  PLT 141* 143* 295*   Basic Metabolic Panel: Recent Labs  Lab 05/22/20 0840 05/22/20 1215 05/23/20 0506  NA 133*  --  136  K 4.0  --  4.3  CL 98  --  100  CO2 26  --  29  GLUCOSE 89  --  82  BUN 15  --  11  CREATININE 4.34* 4.59* 3.92*  CALCIUM 8.2*  --  8.3*   Liver Function Tests: Recent Labs  Lab 05/22/20 0840 05/23/20 0506  AST 24 16  ALT 10 10  ALKPHOS 78 66  BILITOT 1.3* 0.9  PROT 7.6 6.5  ALBUMIN 4.0 3.3*   BNP (last 3 results) Recent Labs  05/22/20 0840  BNP 620.7*   Cardiac Enzymes: No results for input(s): CKTOTAL, CKMB, CKMBINDEX, TROPONINI in the last 168 hours. CBG: Recent Labs  Lab 05/22/20 1452 05/22/20 1658 05/22/20 2143 05/23/20 0934 05/23/20 1206  GLUCAP 81 75 145* 89 102*   Hgb A1c No results for input(s): HGBA1C in the last 72 hours. Lipid Profile No results for input(s): CHOL, HDL, LDLCALC, TRIG, CHOLHDL, LDLDIRECT in the last 72 hours. Thyroid function studies No results for input(s): TSH, T4TOTAL, T3FREE, THYROIDAB in the last 72 hours.  Invalid input(s): FREET3 Anemia work up No results for input(s): VITAMINB12, FOLATE, FERRITIN, TIBC, IRON, RETICCTPCT in the last 72 hours. Urinalysis    Component Value Date/Time   COLORURINE YELLOW (A) 06/09/2017 1039   APPEARANCEUR CLEAR (A) 06/09/2017 1039   APPEARANCEUR Clear 09/15/2014 0947   LABSPEC 1.014 06/09/2017 1039   LABSPEC 1.008 09/15/2014 0947   PHURINE 8.0 06/09/2017 1039   GLUCOSEU 150 (A) 06/09/2017 1039   GLUCOSEU 150 mg/dL 09/15/2014 0947   HGBUR SMALL (A) 06/09/2017 1039   BILIRUBINUR NEGATIVE 06/09/2017 1039   BILIRUBINUR Negative 09/15/2014 0947   KETONESUR NEGATIVE 06/09/2017 1039   PROTEINUR >=300 (A) 06/09/2017 1039    UROBILINOGEN 0.2 06/30/2010 2130   NITRITE NEGATIVE 06/09/2017 1039   LEUKOCYTESUR NEGATIVE 06/09/2017 1039   LEUKOCYTESUR Negative 09/15/2014 0947         Time coordinating discharge: Over 45 minutes  SIGNED: Deatra James, MD, FACP, FHM. [facility],  Please use [email or web address] to Page If 7PM-7AM, please contact night-coverage [email or web address] 05/23/2020, 2:51 PM

## 2020-05-23 NOTE — ED Notes (Signed)
Pt signed paper copy of ED d/c form. AVS was not completed by admitting attending provider prior to discharge. Pt given follow up information regarding removal of femoral cath on Friday morning. Pt was informed that she need [facility] at 0730 on 05/25/20. Informed procedure is scheduled for 0830, instructed to take medications as normal with only sips of water on 9/3 prior to procedure. Pt was instructed to remain NPO after midnight on 9/3. Pt knows she will need transportation home from hospital on 9/3.

## 2020-05-23 NOTE — ED Notes (Addendum)
Pt refusing to take PO meds at this time without eating first. Pt currently does not have a diet order placed and pt states she thinks she is to be NPO due to possibility of having femoral dialysis cath removed today. MD informed by previous RN of pt refusal and states pt can have renal diet if pt does not need dialysis today. This RN unable to know if pt need to remain NPO for a procedure at this time, or if pt will be scheduled for a dialysis treatment today. No nephrologist is signed onto pt at this time to contact for further clarification. MD contacted a second time by this RN for clarification regarding pt care plan at this time. RN unable to administer mediations until clarification is obtained and pt agrees to take PO meds.

## 2020-05-23 NOTE — ED Notes (Signed)
Per pt she receive dialysis mon,wed,friday at 3pm at [facility] [place]. she reports that Dr. Gerre Pebbles (unsure how to spell) is the provider who told her we would be removing the femoral cath today. Pt has hero graft in rt arm that has already been used 4 times w/out any issues. So she will need dialysis here today if she is going to miss her regular apt. please let me know how to proceed  Shahmehdi MD made aware at this time

## 2020-05-23 NOTE — Progress Notes (Signed)
[facility]  ROUNDING NOTE   Subjective:   Hemodialysis treatment yesterday. UF of 2 liters.  Used AVG. Tolerated treatment well. Patient denies any current shortness of breath.   Objective:  Vital signs in last 24 hours:  Temp:  [98.1 F (36.7 C)-98.7 F (37.1 C)] 98.1 F (36.7 C) (08/31 2338) Pulse Rate:  [51-69] 68 (09/01 1211) Resp:  [10-18] 18 (09/01 1211) BP: (161-183)/(59-75) 172/68 (09/01 1211) SpO2:  [94 %-100 %] 94 % (09/01 1211)  Weight change:  Filed Weights   05/22/20 0837  Weight: 83.5 kg    Intake/Output: I/O last 3 completed shifts: In: -  Out: 2000 [Other:2000]   Intake/Output this shift:  No intake/output data recorded.  Physical Exam: General: NAD, laying on stretcher  Head: Normocephalic, atraumatic. Moist oral mucosal membranes  Eyes: Anicteric, PERRL  Neck: Supple, trachea midline  Lungs:  Clear to auscultation  Heart: Regular rate and rhythm  Abdomen:  Soft, nontender,   Extremities:  + peripheral edema.  Neurologic: Nonfocal, moving all four extremities  Skin: No lesions  Access: Right femoral permcath, Left AVG    Basic Metabolic Panel: Recent Labs  Lab 05/22/20 0840 05/22/20 1215 05/23/20 0506  NA 133*  --  136  K 4.0  --  4.3  CL 98  --  100  CO2 26  --  29  GLUCOSE 89  --  82  BUN 15  --  11  CREATININE 4.34* 4.59* 3.92*  CALCIUM 8.2*  --  8.3*    Liver Function Tests: Recent Labs  Lab 05/22/20 0840 05/23/20 0506  AST 24 16  ALT 10 10  ALKPHOS 78 66  BILITOT 1.3* 0.9  PROT 7.6 6.5  ALBUMIN 4.0 3.3*   No results for input(s): LIPASE, AMYLASE in the last 168 hours. No results for input(s): AMMONIA in the last 168 hours.  CBC: Recent Labs  Lab 05/22/20 0840 05/22/20 1215 05/23/20 0506  WBC 4.1 5.8 4.1  NEUTROABS 2.2  --   --   HGB 9.1* 8.9* 8.6*  HCT 28.6* 27.3* 25.7*  MCV 93.2 89.8 89.5  PLT 141* 143* 128*    Cardiac Enzymes: No results for input(s): CKTOTAL, CKMB, CKMBINDEX, TROPONINI  in the last 168 hours.  BNP: Invalid input(s): POCBNP  CBG: Recent Labs  Lab 05/22/20 1452 05/22/20 1658 05/22/20 2143 05/23/20 0934 05/23/20 1206  GLUCAP 81 75 145* 89 102*    Microbiology: Results for orders placed or performed during the hospital encounter of 05/22/20  SARS Coronavirus 2 by RT PCR (hospital order, performed in [facility] hospital lab) Nasopharyngeal Nasopharyngeal Swab     Status: None   Collection Time: 05/22/20  8:03 AM   Specimen: Nasopharyngeal Swab  Result Value Ref Range Status   SARS Coronavirus 2 NEGATIVE NEGATIVE Final    Comment: (NOTE) SARS-CoV-2 target nucleic acids are NOT DETECTED.  The SARS-CoV-2 RNA is generally detectable in upper and lower respiratory specimens during the acute phase of infection. The lowest concentration of SARS-CoV-2 viral copies this assay can detect is 250 copies / mL. A negative result does not preclude SARS-CoV-2 infection and should not be used as the sole basis for treatment or other patient management decisions.  A negative result may occur with improper specimen collection / handling, submission of specimen other than nasopharyngeal swab, presence of viral mutation(s) within the areas targeted by this assay, and inadequate number of viral copies (<250 copies / mL). A negative result must be combined with clinical  observations, patient history, and epidemiological information.  Fact Sheet for Patients:   StrictlyIdeas.no  Fact Sheet for Healthcare Providers: BankingDealers.co.za  This test is not yet approved or  cleared by the [place] FDA and has been authorized for detection and/or diagnosis of SARS-CoV-2 by FDA under an Emergency Use Authorization (EUA).  This EUA will remain in effect (meaning this test can be used) for the duration of the COVID-19 declaration under Section 564(b)(1) of the Act, 21 U.S.C. section 360bbb-3(b)(1), unless the  authorization is terminated or revoked sooner.  Performed at [facility], [address] [place], [place] [address]     Coagulation Studies: No results for input(s): LABPROT, INR in the last 72 hours.  Urinalysis: No results for input(s): COLORURINE, LABSPEC, PHURINE, GLUCOSEU, HGBUR, BILIRUBINUR, KETONESUR, PROTEINUR, UROBILINOGEN, NITRITE, LEUKOCYTESUR in the last 72 hours.  Invalid input(s): APPERANCEUR    Imaging: CT ANGIO CHEST PE W OR WO CONTRAST  Result Date: 05/22/2020 CLINICAL DATA:  Shortness of breath. EXAM: CT ANGIOGRAPHY CHEST WITH CONTRAST TECHNIQUE: Multidetector CT imaging of the chest was performed using the standard protocol during bolus administration of intravenous contrast. Multiplanar CT image reconstructions and MIPs were obtained to evaluate the vascular anatomy. CONTRAST:  32mL OMNIPAQUE IOHEXOL 350 MG/ML SOLN COMPARISON:  Feb 16, 2016. FINDINGS: Cardiovascular: Satisfactory opacification of the pulmonary arteries to the segmental level. No evidence of pulmonary embolism. Mild cardiomegaly is noted. Atherosclerosis of thoracic aorta is noted without aneurysm or dissection. Is seen involving the left subclavian and brachiocephalic vein. Its proximal portion appears to be occluded, resulting in collateral circulations the lead to reconstitution of its more distal portion in the brachiocephalic vein. No pericardial effusion. Mediastinum/Nodes: Thyroid gland and esophagus are unremarkable. Stable enlarged mediastinal and axillary lymph nodes are noted which most likely are reactive or inflammatory in etiology. Lungs/Pleura: No pneumothorax is noted. Small bilateral pleural effusions are noted. Mild interstitial densities are noted in both lung bases concerning for pulmonary edema. Upper Abdomen: Bilateral renal atrophy is noted consistent with history of end-stage renal disease. Catheter is noted in the IVC, with distal tip just inferior to cavoatrial junction.  Musculoskeletal: No chest wall abnormality. No acute or significant osseous findings. Review of the MIP images confirms the above findings. IMPRESSION: 1. No definite evidence of pulmonary embolus. 2. There is a occlusion of the proximal portion of stent graft involving the left subclavian and brachiocephalic veins. Collateral circulation is seen leading to reconstitution of its more distal portion. This most likely is a chronic finding. 3. Stable enlarged mediastinal and axillary lymph nodes are noted which most likely are reactive or inflammatory in etiology. 4. Small bilateral pleural effusions are noted. 5. Mild interstitial densities are noted in both lung bases concerning for pulmonary edema. 6. Bilateral renal atrophy is noted consistent with history of end-stage renal disease. 7. Aortic atherosclerosis. Aortic Atherosclerosis (ICD10-I70.0). Electronically Signed   By: Marijo Conception M.D.   On: 05/22/2020 12:02   DG Chest Portable 1 View  Result Date: 05/22/2020 CLINICAL DATA:  Severe chest pain EXAM: PORTABLE CHEST 1 VIEW COMPARISON:  11/15/2019 FINDINGS: Diffuse interstitial opacity similar to prior. Chronic cardiomegaly. Probable trace pleural effusions. Dialysis catheters from above and below on the right. Left-sided venous stenting. IMPRESSION: Chronic/recurrent pulmonary edema and trace pleural effusions. No acute finding when compared to prior. Electronically Signed   By: Monte Fantasia M.D.   On: 05/22/2020 09:03     Medications:   . sodium chloride     .  aspirin EC  81 mg Oral Daily  . atorvastatin  20 mg Oral QPM  . calcium acetate  2,001 mg Oral TID WC   And  . calcium acetate  667 mg Oral With snacks  . cholecalciferol  1,000 Units Oral Daily  . cholestyramine light  4 g Oral BID  . furosemide  80 mg Oral Daily  . gabapentin  100 mg Oral QPM  . heparin  5,000 Units Subcutaneous Q8H  . hydrOXYzine  25 mg Oral Daily  . insulin aspart  0-6 Units Subcutaneous TID WC  .  ipratropium-albuterol  3 mL Nebulization QID  . lipase/protease/amylase  36,000 Units Oral TID WC   And  . lipase/protease/amylase  12,000 Units Oral With snacks  . multivitamin  1 tablet Oral Daily  . pantoprazole  40 mg Oral Daily  . sevelamer carbonate  800 mg Oral TID WC & HS  . sodium chloride flush  3 mL Intravenous Q12H   sodium chloride, acetaminophen **OR** acetaminophen, bisacodyl, hydrALAZINE, HYDROcodone-acetaminophen, polyethylene glycol, sodium chloride flush  Assessment/ Plan:  Ms. JOELL BUERGER is a 59 y.o. black female with end stage renal disease on hemodialysis for 14 years, diabetes mellitus type II, diabetic gastroparesis, pancreatic deficiency, hypertension, history of renal cancer, peripheral vascular disease, coronary artery disease, GERD, COPD who has been admitted on 05/22/2020 for Acute pulmonary edema ([place]) [J81.0] Pulmonary edema [J81.1] Atypical chest pain [R07.89] ESRD (end stage renal disease) ([place]) [N18.6]     [facility] left femoral permcath 81kg  1. End Stage Renal Disease: using hero AVG. Hero AVG that was placed by Dr. Delana Meyer on 7/23.  - No indication for dialysis today.  - Vascular is scheduled to remove femoral permcath  2. Hypertension:Home regimen ofamlodipine, carvedilol, and hydralazine.   3. Anemia of chronic kidney disease:hemoglobin 8.6.   - EPO with HD treatments  4. Secondary Hyperparathyroidism: outpatient labs 8/10 PTH 390, phos 4.4 and calcium 8.2.  - Continue sevelamer with meals.  - Continue cinacalcet.    LOS: 0 Sarath Kolluru 9/1/202112:42 PM

## 2020-05-23 NOTE — Telephone Encounter (Signed)
Patient was admitted to [facility] for a an unrelated issue before she could be scheduled for a permcath removal. Permcath will be removed while an inpt.

## 2020-05-24 ENCOUNTER — Other Ambulatory Visit (INDEPENDENT_AMBULATORY_CARE_PROVIDER_SITE_OTHER): Payer: Self-pay | Admitting: Nurse Practitioner

## 2020-05-24 DIAGNOSIS — Z992 Dependence on renal dialysis: Secondary | ICD-10-CM | POA: Diagnosis not present

## 2020-05-24 DIAGNOSIS — N186 End stage renal disease: Secondary | ICD-10-CM | POA: Diagnosis not present

## 2020-05-24 NOTE — Telephone Encounter (Signed)
Patient was released from the hospital before having her permcath removed and has been scheduled to have the permcath removed with Anesthesia on 05/25/20 with Dr. Delana Meyer. Patient is to arrive to the [facility] at 7:30 am on 05/25/20. Pre-procedure instructions were discussed as well. Spoke with anesthesia.

## 2020-05-25 ENCOUNTER — Encounter: Payer: Self-pay | Admitting: Vascular Surgery

## 2020-05-25 ENCOUNTER — Other Ambulatory Visit: Payer: Self-pay

## 2020-05-25 ENCOUNTER — Encounter
Admission: RE | Disposition: A | Discharge status: Home / Self Care | Payer: Self-pay | Source: Home / Self Care | Attending: Vascular Surgery

## 2020-05-25 ENCOUNTER — Ambulatory Visit
Admission: RE | Admit: 2020-05-25 | Discharge: 2020-05-25 | Disposition: A | Discharge status: Home / Self Care | Payer: Medicare HMO | Attending: Vascular Surgery | Admitting: Vascular Surgery

## 2020-05-25 DIAGNOSIS — Z992 Dependence on renal dialysis: Secondary | ICD-10-CM | POA: Diagnosis not present

## 2020-05-25 DIAGNOSIS — Z4901 Encounter for fitting and adjustment of extracorporeal dialysis catheter: Secondary | ICD-10-CM | POA: Insufficient documentation

## 2020-05-25 DIAGNOSIS — N186 End stage renal disease: Secondary | ICD-10-CM | POA: Insufficient documentation

## 2020-05-25 HISTORY — PX: DIALYSIS/PERMA CATHETER REMOVAL: CATH118289

## 2020-05-25 LAB — GLUCOSE, CAPILLARY: Glucose-Capillary: 64 mg/dL — ABNORMAL LOW (ref 70–99)

## 2020-05-25 SURGERY — DIALYSIS/PERMA CATHETER REMOVAL
Anesthesia: Moderate Sedation

## 2020-05-25 MED ORDER — FENTANYL CITRATE (PF) 100 MCG/2ML IJ SOLN
INTRAMUSCULAR | Status: AC
  Filled 2020-05-25: qty 2

## 2020-05-25 MED ORDER — MIDAZOLAM HCL 2 MG/2ML IJ SOLN
INTRAMUSCULAR | Status: AC
  Filled 2020-05-25: qty 2

## 2020-05-25 MED ORDER — HYDROMORPHONE HCL 1 MG/ML IJ SOLN: 1.0000 mg | Freq: Once | INTRAMUSCULAR | Status: DC | PRN

## 2020-05-25 MED ORDER — FENTANYL CITRATE (PF) 100 MCG/2ML IJ SOLN
INTRAMUSCULAR | Status: DC | PRN
Start: 2020-05-25 — End: 2020-05-25
  Administered 2020-05-25: 75 ug via INTRAVENOUS

## 2020-05-25 MED ORDER — DEXTROSE 50 % IV SOLN: 50.0000 mL | Freq: Once | INTRAVENOUS | Status: AC

## 2020-05-25 MED ORDER — FAMOTIDINE 20 MG PO TABS: 40.0000 mg | ORAL_TABLET | Freq: Once | ORAL | Status: DC | PRN

## 2020-05-25 MED ORDER — DEXTROSE 50 % IV SOLN
INTRAVENOUS | Status: AC
  Administered 2020-05-25: 50 mL via INTRAVENOUS
  Filled 2020-05-25: qty 50

## 2020-05-25 MED ORDER — DIPHENHYDRAMINE HCL 50 MG/ML IJ SOLN: 50.0000 mg | Freq: Once | INTRAMUSCULAR | Status: DC | PRN

## 2020-05-25 MED ORDER — SODIUM CHLORIDE 0.9 % IV SOLN: INTRAVENOUS | Status: DC

## 2020-05-25 MED ORDER — METHYLPREDNISOLONE SODIUM SUCC 125 MG IJ SOLR: 125.0000 mg | Freq: Once | INTRAMUSCULAR | Status: DC | PRN

## 2020-05-25 MED ORDER — CEFAZOLIN SODIUM-DEXTROSE 1-4 GM/50ML-% IV SOLN: 1.0000 g | Freq: Once | INTRAVENOUS | Status: AC

## 2020-05-25 MED ORDER — MIDAZOLAM HCL 2 MG/ML PO SYRP: 8.0000 mg | ORAL_SOLUTION | Freq: Once | ORAL | Status: DC | PRN

## 2020-05-25 MED ORDER — CEFAZOLIN SODIUM-DEXTROSE 1-4 GM/50ML-% IV SOLN
INTRAVENOUS | Status: AC
  Administered 2020-05-25: 1 g via INTRAVENOUS
  Filled 2020-05-25: qty 50

## 2020-05-25 MED ORDER — MIDAZOLAM HCL 2 MG/2ML IJ SOLN
INTRAMUSCULAR | Status: DC | PRN
  Administered 2020-05-25: 2 mg via INTRAVENOUS

## 2020-05-25 SURGICAL SUPPLY — 4 items
SUT MNCRL 4-0 (SUTURE) ×2
SUT MNCRL 4-0 27XMFL (SUTURE) ×1
SUTURE MNCRL 4-0 27XMF (SUTURE) ×1 IMPLANT
TRAY LACERAT/PLASTIC (MISCELLANEOUS) ×2 IMPLANT

## 2020-05-25 NOTE — Interval H&P Note (Signed)
History and Physical Interval Note:  05/25/2020 8:28 AM  [name]  has presented today for surgery, with the diagnosis of ESRD.  The various methods of treatment have been discussed with the patient and family. After consideration of risks, benefits and other options for treatment, the patient has consented to  Procedure(s): DIALYSIS/PERMA CATHETER REMOVAL (N/A) as a surgical intervention.  The patient's history has been reviewed, patient examined, no change in status, stable for surgery.  I have reviewed the patient's chart and labs.  Questions were answered to the patient's satisfaction.     Hortencia Pilar

## 2020-05-25 NOTE — Discharge Instructions (Signed)
Moderate Conscious Sedation, Adult, Care After °These instructions provide you with information about caring for yourself after your procedure. Your health care provider may also give you more specific instructions. Your treatment has been planned according to current medical practices, but problems sometimes occur. Call your health care provider if you have any problems or questions after your procedure. °What can I expect after the procedure? °After your procedure, it is common: °· To feel sleepy for several hours. °· To feel clumsy and have poor balance for several hours. °· To have poor judgment for several hours. °· To vomit if you eat too soon. °Follow these instructions at home: °For at least 24 hours after the procedure: ° °· Do not: °? Participate in activities where you could fall or become injured. °? Drive. °? Use heavy machinery. °? Drink alcohol. °? Take sleeping pills or medicines that cause drowsiness. °? Make important decisions or sign legal documents. °? Take care of children on your own. °· Rest. °Eating and drinking °· Follow the diet recommended by your health care provider. °· If you vomit: °? Drink water, juice, or soup when you can drink without vomiting. °? Make sure you have little or no nausea before eating solid foods. °General instructions °· Have a responsible adult stay with you until you are awake and alert. °· Take over-the-counter and prescription medicines only as told by your health care provider. °· If you smoke, do not smoke without supervision. °· Keep all follow-up visits as told by your health care provider. This is important. °Contact a health care provider if: °· You keep feeling nauseous or you keep vomiting. °· You feel light-headed. °· You develop a rash. °· You have a fever. °Get help right away if: °· You have trouble breathing. °This information is not intended to replace advice given to you by your health care provider. Make sure you discuss any questions you have  with your health care provider. °Document Revised: 08/21/2017 Document Reviewed: 12/29/2015 °Elsevier Patient Education © 2020 [employer] °Wound Care, Adult °Taking care of your wound properly can help to prevent pain, infection, and scarring. It can also help your wound to heal more quickly. °How to care for your wound °Wound care ° °  ° °· Follow instructions from your health care provider about how to take care of your wound. Make sure you: °? Wash your hands with soap and water before you change the bandage (dressing). If soap and water are not available, use hand sanitizer. °? Change your dressing as told by your health care provider. °? Leave stitches (sutures), skin glue, or adhesive strips in place. These skin closures may need to stay in place for 2 weeks or longer. If adhesive strip edges start to loosen and curl up, you may trim the loose edges. Do not remove adhesive strips completely unless your health care provider tells you to do that. °· Check your wound area every day for signs of infection. Check for: °? Redness, swelling, or pain. °? Fluid or blood. °? Warmth. °? Pus or a bad smell. °· Ask your health care provider if you should clean the wound with mild soap and water. Doing this may include: °? Using a clean towel to pat the wound dry after cleaning it. Do not rub or scrub the wound. °? Applying a cream or ointment. Do this only as told by your health care provider. °? Covering the incision with a clean dressing. °· Ask your health care provider when   you can leave the wound uncovered. °· Keep the dressing dry until your health care provider says it can be removed. Do not take baths, swim, use a hot tub, or do anything that would put the wound underwater until your health care provider approves. Ask your health care provider if you can take showers. You may only be allowed to take sponge baths. °Medicines ° °· If you were prescribed an antibiotic medicine, cream, or ointment, take or use the  antibiotic as told by your health care provider. Do not stop taking or using the antibiotic even if your condition improves. °· Take over-the-counter and prescription medicines only as told by your health care provider. If you were prescribed pain medicine, take it 30 or more minutes before you do any wound care or as told by your health care provider. °General instructions °· Return to your normal activities as told by your health care provider. Ask your health care provider what activities are safe. °· Do not scratch or pick at the wound. °· Do not use any products that contain nicotine or tobacco, such as cigarettes and e-cigarettes. These may delay wound healing. If you need help quitting, ask your health care provider. °· Keep all follow-up visits as told by your health care provider. This is important. °· Eat a diet that includes protein, vitamin A, vitamin C, and other nutrient-rich foods to help the wound heal. °? Foods rich in protein include meat, dairy, beans, nuts, and other sources. °? Foods rich in vitamin A include carrots and dark green, leafy vegetables. °? Foods rich in vitamin C include citrus, tomatoes, and other fruits and vegetables. °? Nutrient-rich foods have protein, carbohydrates, fat, vitamins, or minerals. Eat a variety of healthy foods including vegetables, fruits, and whole grains. °Contact a health care provider if: °· You received a tetanus shot and you have swelling, severe pain, redness, or bleeding at the injection site. °· Your pain is not controlled with medicine. °· You have redness, swelling, or pain around the wound. °· You have fluid or blood coming from the wound. °· Your wound feels warm to the touch. °· You have pus or a bad smell coming from the wound. °· You have a fever or chills. °· You are nauseous or you vomit. °· You are dizzy. °Get help right away if: °· You have a red streak going away from your wound. °· The edges of the wound open up and separate. °· Your wound  is bleeding, and the bleeding does not stop with gentle pressure. °· You have a rash. °· You faint. °· You have trouble breathing. °Summary °· Always wash your hands with soap and water before changing your bandage (dressing). °· To help with healing, eat foods that are rich in protein, vitamin A, vitamin C, and other nutrients. °· Check your wound every day for signs of infection. Contact your health care provider if you suspect that your wound is infected. °This information is not intended to replace advice given to you by your health care provider. Make sure you discuss any questions you have with your health care provider. °Document Revised: 12/27/2018 Document Reviewed: 03/25/2016 °Elsevier Patient Education © 2020 Elsevier Inc. ° °

## 2020-05-25 NOTE — Op Note (Signed)
  OPERATIVE NOTE   PROCEDURE: 1. Removal of a right femoral tunneled dialysis catheter  PRE-OPERATIVE DIAGNOSIS: Complication of dialysis catheter  POST-OPERATIVE DIAGNOSIS: Same  SURGEON: Hortencia Pilar  ANESTHESIA: Local anesthetic with 1% lidocaine with epinephrine   ESTIMATED BLOOD LOSS: Minimal   FINDING(S): 1. Catheter intact   SPECIMEN(S):  Catheter  Sedation: Continuous ECG pulse oximetry and cardiopulmonary monitoring was performed by the interventional radiology nurse total sedation time was 19 minutes and 45 seconds.  I was present for the entirety of the case.  INDICATIONS:   [name] is a 59 y.o. female who presents with functioning hero graft.  Therefore the tunneled catheter is being removed.  Risks and benefits of been reviewed all questions answered patient agrees to proceed.  DESCRIPTION: After obtaining full informed written consent, the patient was positioned supine. The right thigh catheter and surrounding area is prepped and draped in a sterile fashion. The cuff was localized by palpation and noted to be greater than 3 cm from the exit site. After appropriate timeout is called, 1% lidocaine with epinephrine is infiltrated into the surrounding tissues around the cuff. Small transverse incision is created with an 11 blade scalpel and the dissection was carried down to expose the cuff of the tunneled catheter.  The catheter is then freed from the surrounding attachments and adhesions. Once the catheter has been freed circumferentially it is transected just distal to the cuff and subsequently removed in 2 pieces. Light pressure was held at the base of the neck. A 4-0 Monocryl was used close the tunnel in the subcutaneous space. The 4-0 Monocryl Monocryl was then used to close the skin in a subcuticular stitch. Dermabond is applied.  Antibiotic ointment and a sterile dressing is applied to the exit site. Patient tolerated procedure well and there were no  complications.  COMPLICATIONS: None  CONDITION: Unchanged  Hortencia Pilar. [facility] Office: [phone]  05/25/2020,9:22 AM

## 2020-05-26 DIAGNOSIS — N186 End stage renal disease: Secondary | ICD-10-CM | POA: Diagnosis not present

## 2020-05-26 DIAGNOSIS — Z992 Dependence on renal dialysis: Secondary | ICD-10-CM | POA: Diagnosis not present

## 2020-05-28 DIAGNOSIS — N186 End stage renal disease: Secondary | ICD-10-CM | POA: Diagnosis not present

## 2020-05-28 DIAGNOSIS — Z992 Dependence on renal dialysis: Secondary | ICD-10-CM | POA: Diagnosis not present

## 2020-05-30 DIAGNOSIS — N186 End stage renal disease: Secondary | ICD-10-CM | POA: Diagnosis not present

## 2020-05-30 DIAGNOSIS — Z992 Dependence on renal dialysis: Secondary | ICD-10-CM | POA: Diagnosis not present

## 2020-05-30 NOTE — Progress Notes (Signed)
Patient ID: [name], female   DOB: 1961-06-08, 59 y.o.   MRN: [id number]  No chief complaint on file.   HPI Robin YARBERRY is a 59 y.o. female.    Still having problems with cannulation but not having much pain Right thigh feels better  Past Medical History:  Diagnosis Date  . Anemia   . Anginal pain ([place])   . Anxiety   . Arthritis   . Asthma   . Broken wrist   . Bronchitis   . chronic diastolic CHF 9/51/8841  . Chronic kidney disease   . COPD (chronic obstructive pulmonary disease) ([place])   . Coronary artery disease    a. cath 2013: stenting to RCA (report not available); b. cath 2014: LM nl, pLAD 40%, mLAD nl, ost LCx 40%, mid LCx nl, pRCA 30% @ site of prior stent, mRCA 50%  . Depression   . Diabetes mellitus ([place])   . Diabetes mellitus without complication ([place])   . Diabetic neuropathy ([place])   . dialysis 2006  . Diverticulosis   . Dizziness   . Dyspnea   . Elevated lipids   . Environmental and seasonal allergies   . ESRD (end stage renal disease) on dialysis ([place])    M-W-F  . Gastroparesis   . GERD (gastroesophageal reflux disease)   . Headache   . History of anemia due to chronic kidney disease   . History of hiatal hernia   . HOH (hard of hearing)   . Hx of pancreatitis 2015  . Hypertension   . Lower extremity edema   . Mitral regurgitation    a. echo 10/2013: EF 62%, noWMA, mildly dilated LA, mild to mod MR/TR, GR1DD  . Myocardial infarction ([place])   . Orthopnea   . Parathyroid abnormality ([place])   . Peripheral arterial disease ([place])   . Pneumonia   . Renal cancer ([place])   . Renal insufficiency    Pt is on dialysis on M,W + F.  . Wheezing     Past Surgical History:  Procedure Laterality Date  . A/V SHUNTOGRAM Left 01/20/2018   Procedure: A/V SHUNTOGRAM;  Surgeon: Algernon Huxley, MD;  Location: [facility];  Service: Cardiovascular;  Laterality: Left;  . ABDOMINAL HYSTERECTOMY  1992  . AMPUTATION TOE Left 10/02/2017   Procedure: AMPUTATION  TOE-LEFT GREAT TOE;  Surgeon: Albertine Patricia, DPM;  Location: [facility];  Service: Podiatry;  Laterality: Left;  . APPENDECTOMY    . APPLICATION OF WOUND VAC N/A 11/25/2019   Procedure: APPLICATION OF WOUND VAC;  Surgeon: Katha Cabal, MD;  Location: [facility];  Service: Vascular;  Laterality: N/A;  . ARTERY BIOPSY Right 10/11/2018   Procedure: BIOPSY TEMPORAL ARTERY;  Surgeon: Vickie Epley, MD;  Location: [facility];  Service: General;  Laterality: Right;  . CARDIAC CATHETERIZATION Left 07/26/2015   Procedure: Left Heart Cath and Coronary Angiography;  Surgeon: Dionisio David, MD;  Location: [facility];  Service: Cardiovascular;  Laterality: Left;  . CATARACT EXTRACTION W/ INTRAOCULAR LENS IMPLANT Right   . CATARACT EXTRACTION W/PHACO Left 03/10/2017   Procedure: CATARACT EXTRACTION PHACO AND INTRAOCULAR LENS PLACEMENT (IOC);  Surgeon: Birder Robson, MD;  Location: [facility];  Service: Ophthalmology;  Laterality: Left;  [place] 00:51.9 AP% 14.2 CDE 7.39 fluid pack lot # [id number]  . CENTRAL LINE INSERTION Right 11/11/2019   Procedure: CENTRAL LINE INSERTION;  Surgeon: Katha Cabal, MD;  Location: [facility];  Service: Vascular;  Laterality: Right;  . CENTRAL LINE INSERTION  11/25/2019   Procedure: CENTRAL LINE INSERTION;  Surgeon: Katha Cabal, MD;  Location: [facility];  Service: Vascular;;  . CHOLECYSTECTOMY    . COLONOSCOPY WITH PROPOFOL N/A 08/12/2016   Procedure: COLONOSCOPY WITH PROPOFOL;  Surgeon: Lollie Sails, MD;  Location: [facility];  Service: Endoscopy;  Laterality: N/A;  . DIALYSIS FISTULA CREATION Left    upper arm  . dialysis grafts    . DIALYSIS/PERMA CATHETER INSERTION N/A 11/14/2019   Procedure: DIALYSIS/PERMA CATHETER INSERTION;  Surgeon: Algernon Huxley, MD;  Location: [facility];  Service: Cardiovascular;  Laterality: N/A;  . DIALYSIS/PERMA CATHETER INSERTION N/A 02/03/2020   Procedure: DIALYSIS/PERMA CATHETER INSERTION;  Surgeon:  Katha Cabal, MD;  Location: [place];  Service: Cardiovascular;  Laterality: N/A;  . DIALYSIS/PERMA CATHETER REMOVAL N/A 05/25/2020   Procedure: DIALYSIS/PERMA CATHETER REMOVAL;  Surgeon: Katha Cabal, MD;  Location: [facility];  Service: Cardiovascular;  Laterality: N/A;  . ESOPHAGOGASTRODUODENOSCOPY N/A 03/08/2015   Procedure: ESOPHAGOGASTRODUODENOSCOPY (EGD);  Surgeon: Manya Silvas, MD;  Location[facility];  Service: Endoscopy;  Laterality: N/A;  . ESOPHAGOGASTRODUODENOSCOPY (EGD) WITH PROPOFOL N/A 03/18/2016   Procedure: ESOPHAGOGASTRODUODENOSCOPY (EGD) WITH PROPOFOL;  Surgeon: [name], MD;  Location: [facility];  Service: Endoscopy;  Laterality: N/A;  . EYE SURGERY Right 2018  . FECAL TRANSPLANT N/A 08/23/2015   Procedure: FECAL TRANSPLANT;  Surgeon: Manya Silvas, MD;  Location[facility];  Service: Endoscopy;  Laterality: N/A;  . HAND SURGERY Bilateral   . HEMATOMA EVACUATION Left 11/25/2019   Procedure: EVACUATION HEMATOMA;  Surgeon: Katha Cabal, MD;  Location: [facility];  Service: Vascular;  Laterality: Left;  . I & D EXTREMITY Left 11/25/2019   Procedure: IRRIGATION AND DEBRIDEMENT EXTREMITY;  Surgeon: Katha Cabal, MD;  Location: [facility];  Service: Vascular;  Laterality: Left;  . IR FLUORO GUIDE CV LINE RIGHT  04/06/2020  . IR RADIOLOGIST EVAL & MGMT  07/28/2019  . IR RADIOLOGIST EVAL & MGMT  08/11/2019  . LIGATION OF ARTERIOVENOUS  FISTULA Left 11/11/2019  . LIGATION OF ARTERIOVENOUS  FISTULA Left 11/11/2019   Procedure: LIGATION OF ARTERIOVENOUS  FISTULA;  Surgeon: Katha Cabal, MD;  Location: [facility];  Service: Vascular;  Laterality: Left;  . PERIPHERAL VASCULAR CATHETERIZATION N/A 12/20/2015   Procedure: Thrombectomy of dialysis access versus permcath placement;  Surgeon: Algernon Huxley, MD;  Location: [facility];  Service: Cardiovascular;  Laterality: N/A;  . PERIPHERAL VASCULAR CATHETERIZATION N/A  12/20/2015   Procedure: A/V Shunt Intervention;  Surgeon: Algernon Huxley, MD;  Location: [facility];  Service: Cardiovascular;  Laterality: N/A;  . PERIPHERAL VASCULAR CATHETERIZATION N/A 12/20/2015   Procedure: A/V Shuntogram/Fistulagram;  Surgeon: Algernon Huxley, MD;  Location: [place];  Service: Cardiovascular;  Laterality: N/A;  . PERIPHERAL VASCULAR CATHETERIZATION N/A 01/02/2016   Procedure: A/V Shuntogram/Fistulagram;  Surgeon: Algernon Huxley, MD;  Location: [facility];  Service: Cardiovascular;  Laterality: N/A;  . PERIPHERAL VASCULAR CATHETERIZATION N/A 01/02/2016   Procedure: A/V Shunt Intervention;  Surgeon: Algernon Huxley, MD;  Location: [facility] [facility];  Service: Cardiovascular;  Laterality: N/A;  . UPPER EXTREMITY VENOGRAPHY Right 01/18/2020   Procedure: UPPER EXTREMITY VENOGRAPHY;  Surgeon: Katha Cabal, MD;  Location: [place] [facility];  Service: Cardiovascular;  Laterality: Right;  Mar[place] Kitchen VASCULAR ACCESS DEVICE INSERTION Right 04/13/2020   Procedure: INSERTION OF HERO VASCULAR  ACCESS DEVICE (GRAFT);  Surgeon: Katha Cabal, MD;  Location: [facility];  Service: Vascular;  Laterality: Right;      Allergies  Allergen Reactions  . Ace Inhibitors Swelling and Anaphylaxis  . Ativan [Lorazepam] Other (See Comments)    Reaction:Hallucinations and headaches  . Compazine [Prochlorperazine Edisylate] Anaphylaxis, Nausea And Vomiting and Other (See Comments)    Other reaction(s): dystonia from this vs. Reglan, 23 Jul - patient relates that she takes promethazine frequently with no problems  . Sumatriptan Succinate Other (See Comments)    Other reaction(s): delirium and hallucinations per [facility] records  . Zofran [Ondansetron] Nausea And Vomiting    Per pt. she is allergic to zofran or will experience adverse reaction like hallucination   . [place] Nausea And Vomiting  . Losartan Nausea Only  . Prochlorperazine Other (See Comments)    Reaction:  Unknown  . Patient does not remember reaction but she does have vertigo and anxiety along with n and v at times. Could be used to treat any of these   . Reglan [Metoclopramide] Other (See Comments)    Per patient her Dr. Evelina Bucy her off it   . Scopolamine Other (See Comments)    Dizziness, also has vertigo already  . Tape Rash    Plastic tape causes rash  . Tapentadol Rash    Current Outpatient Medications  Medication Sig Dispense Refill  . albuterol (PROVENTIL) (2.5 MG/3ML) 0.083% nebulizer solution Take 2.5 mg by nebulization every 4 (four) hours as needed for wheezing or shortness of breath.    [place] Kitchen albuterol (VENTOLIN HFA) 108 (90 Base) MCG/ACT inhaler Inhale 2 puffs into the lungs every 4 (four) hours as needed for wheezing or shortness of breath.    Mar[place] Kitchen ammonium lactate (LAC-HYDRIN) 12 % lotion Apply 1 application topically 2 (two) times daily as needed for dry skin.     . Ascorbic Acid (VITA-C PO) Take 1 tablet by mouth daily.    [place] Kitchen aspirin EC 81 MG tablet Take 81 mg by mouth daily.    [place] Kitchen atorvastatin (LIPITOR) 20 MG tablet Take 20 mg by mouth every evening.     . calcium acetate (PHOSLO) 667 MG capsule Take 667-2,001 mg by mouth See admin instructions. Take 3 capsules (2001mg ) by mouth three times a day with meals and take 1 capsule (667mg ) by mouth daily with a snack    . carvedilol (COREG) 6.25 MG tablet Take 6.25 mg by mouth 2 (two) times daily.  (Patient not taking: Reported on 05/22/2020)    . Cholecalciferol (VITAMIN D3) 25 MCG (1000 UT) CAPS Take 1,000 Units by mouth daily.     . cholestyramine light (PREVALITE) 4 g packet Take 4 g by mouth 2 (two) times daily.    [place] Kitchen CREON 12000 units CPEP capsule Take 12,000-36,000 Units by mouth See admin instructions. Take 3 capsules (36,000u) by mouth three times daily with meals and take 1 capsule (12,000u) by mouth daily with a snack    . cyanocobalamin 1000 MCG tablet Take 1,000 mcg by mouth daily.     . diphenhydrAMINE (BENADRYL) 25 MG tablet Take 25 mg by  mouth every 6 (six) hours as needed for itching.    . Ferrous Sulfate (IRON) 325 (65 Fe) MG TABS Take 325 mg by mouth daily.     . furosemide (LASIX) 80 MG tablet Take 80 mg by mouth daily.    Mar[place] Kitchen gabapentin (NEURONTIN) 100 MG capsule Take 100 mg by mouth every evening.     Mar[place] Kitchen  HYDROcodone-acetaminophen (NORCO/VICODIN) 5-325 MG tablet Take 1 tablet by mouth 4 (four) times daily as needed for moderate pain or severe pain.     . hydrOXYzine (ATARAX/VISTARIL) 25 MG tablet Take 25 mg by mouth daily.     . insulin aspart (NOVOLOG) 100 UNIT/ML injection Inject 4-15 Units into the skin in the morning, at noon, in the evening, and at bedtime. Sliding scale    . insulin detemir (LEVEMIR) 100 UNIT/ML injection Inject 67 Units into the skin at bedtime.    . meclizine (ANTIVERT) 25 MG tablet Take 25 mg by mouth every 6 (six) hours.     . multivitamin (RENA-VIT) TABS tablet Take 1 tablet by mouth daily.     . mupirocin ointment (BACTROBAN) 2 % Apply 1 application topically daily as needed (leg rash).     . nitroGLYCERIN (NITROSTAT) 0.4 MG SL tablet Place 0.4 mg under the tongue every 5 (five) minutes as needed for chest pain.     . Omega-3 300 MG CAPS Take 300 mg by mouth 2 (two) times daily. Every other day opposite omega XL    . pantoprazole (PROTONIX) 40 MG tablet Take 40 mg by mouth daily.    . Probiotic Product (PROBIOTIC PO) Take 1 capsule by mouth daily.    . sevelamer carbonate (RENVELA) 800 MG tablet Take 800 mg by mouth 4 (four) times daily. (at meals and with a snack)    . traMADol (ULTRAM) 50 MG tablet Take 1 tablet (50 mg total) by mouth every 8 (eight) hours as needed for moderate pain or severe pain. 30 tablet 0   No current facility-administered medications for this visit.        Physical Exam There were no vitals taken for this visit. Gen:  WD/WN, NAD Skin: incision C/D/I; right arm good thrill good bruit     Assessment/Plan: 1. Complication of arteriovenous dialysis fistula,  subsequent encounter Recommend:  The patient is doing well and currently has adequate dialysis access. The patient's dialysis center is not reporting any access issues. Flow pattern is stable when compared to the prior ultrasound.  The patient should have a duplex ultrasound of the dialysis access in 6 months.  The patient will follow-up with me in the office after each ultrasound    - VAS [place] (AVF, AVG); Future      Hortencia Pilar 05/30/2020, 6:21 PM   This note was created with Dragon medical transcription system.  Any errors from dictation are unintentional.

## 2020-05-31 ENCOUNTER — Encounter (INDEPENDENT_AMBULATORY_CARE_PROVIDER_SITE_OTHER): Payer: Self-pay | Admitting: Vascular Surgery

## 2020-05-31 ENCOUNTER — Ambulatory Visit (INDEPENDENT_AMBULATORY_CARE_PROVIDER_SITE_OTHER): Payer: Medicare HMO | Admitting: Vascular Surgery

## 2020-05-31 ENCOUNTER — Other Ambulatory Visit: Payer: Self-pay

## 2020-05-31 ENCOUNTER — Telehealth: Payer: Self-pay | Admitting: Family

## 2020-05-31 VITALS — BP 156/79 | HR 64 | Ht 65.0 in | Wt 178.0 lb

## 2020-05-31 DIAGNOSIS — I10 Essential (primary) hypertension: Secondary | ICD-10-CM | POA: Insufficient documentation

## 2020-05-31 DIAGNOSIS — N186 End stage renal disease: Secondary | ICD-10-CM | POA: Insufficient documentation

## 2020-05-31 DIAGNOSIS — T829XXD Unspecified complication of cardiac and vascular prosthetic device, implant and graft, subsequent encounter: Secondary | ICD-10-CM

## 2020-05-31 DIAGNOSIS — D631 Anemia in chronic kidney disease: Secondary | ICD-10-CM | POA: Insufficient documentation

## 2020-05-31 DIAGNOSIS — E109 Type 1 diabetes mellitus without complications: Secondary | ICD-10-CM | POA: Insufficient documentation

## 2020-05-31 NOTE — Telephone Encounter (Signed)
Spoke to patient who said she is doing ok since her hospital discharge. She has sob always due to COPD but otherwise doing ok. She does not have a scale to keep up with daily weights but we advised her we can give her one at her first New patient appt with the [facility] that was made while she was inpatient for sept 17 th. She is following a low sodium diet as well. Told patient I would set her up transportation with [facility].   [name], NT

## 2020-06-02 ENCOUNTER — Encounter: Payer: Self-pay | Admitting: Emergency Medicine

## 2020-06-02 ENCOUNTER — Other Ambulatory Visit: Payer: Self-pay

## 2020-06-02 ENCOUNTER — Emergency Department: Payer: Medicare HMO

## 2020-06-02 ENCOUNTER — Inpatient Hospital Stay
Admission: EM | Admit: 2020-06-02 | Discharge: 2020-06-25 | DRG: 252 | Disposition: A | Discharge status: Skilled Nursing Facility | Payer: Medicare HMO | Attending: Hospitalist | Admitting: Hospitalist

## 2020-06-02 DIAGNOSIS — G894 Chronic pain syndrome: Secondary | ICD-10-CM | POA: Diagnosis not present

## 2020-06-02 DIAGNOSIS — M7989 Other specified soft tissue disorders: Secondary | ICD-10-CM | POA: Diagnosis not present

## 2020-06-02 DIAGNOSIS — E1151 Type 2 diabetes mellitus with diabetic peripheral angiopathy without gangrene: Secondary | ICD-10-CM | POA: Diagnosis present

## 2020-06-02 DIAGNOSIS — Z7982 Long term (current) use of aspirin: Secondary | ICD-10-CM

## 2020-06-02 DIAGNOSIS — N2581 Secondary hyperparathyroidism of renal origin: Secondary | ICD-10-CM | POA: Diagnosis present

## 2020-06-02 DIAGNOSIS — D62 Acute posthemorrhagic anemia: Secondary | ICD-10-CM | POA: Diagnosis not present

## 2020-06-02 DIAGNOSIS — Z90710 Acquired absence of both cervix and uterus: Secondary | ICD-10-CM | POA: Diagnosis not present

## 2020-06-02 DIAGNOSIS — R0902 Hypoxemia: Secondary | ICD-10-CM | POA: Diagnosis not present

## 2020-06-02 DIAGNOSIS — I5033 Acute on chronic diastolic (congestive) heart failure: Secondary | ICD-10-CM | POA: Diagnosis present

## 2020-06-02 DIAGNOSIS — K219 Gastro-esophageal reflux disease without esophagitis: Secondary | ICD-10-CM | POA: Diagnosis present

## 2020-06-02 DIAGNOSIS — J811 Chronic pulmonary edema: Secondary | ICD-10-CM | POA: Diagnosis not present

## 2020-06-02 DIAGNOSIS — I161 Hypertensive emergency: Secondary | ICD-10-CM | POA: Diagnosis present

## 2020-06-02 DIAGNOSIS — J9690 Respiratory failure, unspecified, unspecified whether with hypoxia or hypercapnia: Secondary | ICD-10-CM

## 2020-06-02 DIAGNOSIS — T80212A Local infection due to central venous catheter, initial encounter: Secondary | ICD-10-CM | POA: Diagnosis not present

## 2020-06-02 DIAGNOSIS — D631 Anemia in chronic kidney disease: Secondary | ICD-10-CM | POA: Diagnosis present

## 2020-06-02 DIAGNOSIS — Z833 Family history of diabetes mellitus: Secondary | ICD-10-CM

## 2020-06-02 DIAGNOSIS — I071 Rheumatic tricuspid insufficiency: Secondary | ICD-10-CM | POA: Diagnosis present

## 2020-06-02 DIAGNOSIS — G929 Unspecified toxic encephalopathy: Secondary | ICD-10-CM | POA: Diagnosis not present

## 2020-06-02 DIAGNOSIS — J441 Chronic obstructive pulmonary disease with (acute) exacerbation: Secondary | ICD-10-CM | POA: Diagnosis not present

## 2020-06-02 DIAGNOSIS — G4733 Obstructive sleep apnea (adult) (pediatric): Secondary | ICD-10-CM | POA: Diagnosis present

## 2020-06-02 DIAGNOSIS — D696 Thrombocytopenia, unspecified: Secondary | ICD-10-CM | POA: Diagnosis not present

## 2020-06-02 DIAGNOSIS — A439 Nocardiosis, unspecified: Secondary | ICD-10-CM | POA: Diagnosis not present

## 2020-06-02 DIAGNOSIS — J440 Chronic obstructive pulmonary disease with acute lower respiratory infection: Secondary | ICD-10-CM | POA: Diagnosis present

## 2020-06-02 DIAGNOSIS — J9811 Atelectasis: Secondary | ICD-10-CM | POA: Diagnosis not present

## 2020-06-02 DIAGNOSIS — Z794 Long term (current) use of insulin: Secondary | ICD-10-CM

## 2020-06-02 DIAGNOSIS — J449 Chronic obstructive pulmonary disease, unspecified: Secondary | ICD-10-CM | POA: Diagnosis not present

## 2020-06-02 DIAGNOSIS — E876 Hypokalemia: Secondary | ICD-10-CM | POA: Diagnosis not present

## 2020-06-02 DIAGNOSIS — Z20822 Contact with and (suspected) exposure to covid-19: Secondary | ICD-10-CM | POA: Diagnosis present

## 2020-06-02 DIAGNOSIS — T827XXA Infection and inflammatory reaction due to other cardiac and vascular devices, implants and grafts, initial encounter: Principal | ICD-10-CM | POA: Diagnosis present

## 2020-06-02 DIAGNOSIS — J9601 Acute respiratory failure with hypoxia: Secondary | ICD-10-CM | POA: Diagnosis present

## 2020-06-02 DIAGNOSIS — E1143 Type 2 diabetes mellitus with diabetic autonomic (poly)neuropathy: Secondary | ICD-10-CM | POA: Diagnosis present

## 2020-06-02 DIAGNOSIS — I2510 Atherosclerotic heart disease of native coronary artery without angina pectoris: Secondary | ICD-10-CM | POA: Diagnosis present

## 2020-06-02 DIAGNOSIS — M1990 Unspecified osteoarthritis, unspecified site: Secondary | ICD-10-CM | POA: Diagnosis present

## 2020-06-02 DIAGNOSIS — I10 Essential (primary) hypertension: Secondary | ICD-10-CM | POA: Diagnosis not present

## 2020-06-02 DIAGNOSIS — I959 Hypotension, unspecified: Secondary | ICD-10-CM | POA: Diagnosis not present

## 2020-06-02 DIAGNOSIS — Z85528 Personal history of other malignant neoplasm of kidney: Secondary | ICD-10-CM

## 2020-06-02 DIAGNOSIS — Z809 Family history of malignant neoplasm, unspecified: Secondary | ICD-10-CM

## 2020-06-02 DIAGNOSIS — J90 Pleural effusion, not elsewhere classified: Secondary | ICD-10-CM | POA: Diagnosis not present

## 2020-06-02 DIAGNOSIS — R6889 Other general symptoms and signs: Secondary | ICD-10-CM | POA: Diagnosis not present

## 2020-06-02 DIAGNOSIS — R0600 Dyspnea, unspecified: Secondary | ICD-10-CM

## 2020-06-02 DIAGNOSIS — J8489 Other specified interstitial pulmonary diseases: Secondary | ICD-10-CM | POA: Diagnosis not present

## 2020-06-02 DIAGNOSIS — M2550 Pain in unspecified joint: Secondary | ICD-10-CM | POA: Diagnosis not present

## 2020-06-02 DIAGNOSIS — Y832 Surgical operation with anastomosis, bypass or graft as the cause of abnormal reaction of the patient, or of later complication, without mention of misadventure at the time of the procedure: Secondary | ICD-10-CM | POA: Diagnosis present

## 2020-06-02 DIAGNOSIS — I2699 Other pulmonary embolism without acute cor pulmonale: Secondary | ICD-10-CM | POA: Diagnosis not present

## 2020-06-02 DIAGNOSIS — K5730 Diverticulosis of large intestine without perforation or abscess without bleeding: Secondary | ICD-10-CM | POA: Diagnosis not present

## 2020-06-02 DIAGNOSIS — T82898A Other specified complication of vascular prosthetic devices, implants and grafts, initial encounter: Secondary | ICD-10-CM | POA: Diagnosis not present

## 2020-06-02 DIAGNOSIS — D638 Anemia in other chronic diseases classified elsewhere: Secondary | ICD-10-CM | POA: Diagnosis not present

## 2020-06-02 DIAGNOSIS — I5023 Acute on chronic systolic (congestive) heart failure: Secondary | ICD-10-CM | POA: Diagnosis not present

## 2020-06-02 DIAGNOSIS — E669 Obesity, unspecified: Secondary | ICD-10-CM | POA: Diagnosis present

## 2020-06-02 DIAGNOSIS — Z841 Family history of disorders of kidney and ureter: Secondary | ICD-10-CM

## 2020-06-02 DIAGNOSIS — R071 Chest pain on breathing: Secondary | ICD-10-CM | POA: Diagnosis not present

## 2020-06-02 DIAGNOSIS — Z992 Dependence on renal dialysis: Secondary | ICD-10-CM

## 2020-06-02 DIAGNOSIS — R52 Pain, unspecified: Secondary | ICD-10-CM

## 2020-06-02 DIAGNOSIS — A4181 Sepsis due to Enterococcus: Secondary | ICD-10-CM | POA: Diagnosis present

## 2020-06-02 DIAGNOSIS — B952 Enterococcus as the cause of diseases classified elsewhere: Secondary | ICD-10-CM | POA: Diagnosis present

## 2020-06-02 DIAGNOSIS — M6281 Muscle weakness (generalized): Secondary | ICD-10-CM | POA: Diagnosis not present

## 2020-06-02 DIAGNOSIS — J9602 Acute respiratory failure with hypercapnia: Secondary | ICD-10-CM | POA: Diagnosis present

## 2020-06-02 DIAGNOSIS — J189 Pneumonia, unspecified organism: Secondary | ICD-10-CM | POA: Diagnosis present

## 2020-06-02 DIAGNOSIS — K3184 Gastroparesis: Secondary | ICD-10-CM | POA: Diagnosis present

## 2020-06-02 DIAGNOSIS — R6520 Severe sepsis without septic shock: Secondary | ICD-10-CM | POA: Diagnosis present

## 2020-06-02 DIAGNOSIS — Z888 Allergy status to other drugs, medicaments and biological substances status: Secondary | ICD-10-CM

## 2020-06-02 DIAGNOSIS — Z79899 Other long term (current) drug therapy: Secondary | ICD-10-CM

## 2020-06-02 DIAGNOSIS — I509 Heart failure, unspecified: Secondary | ICD-10-CM | POA: Diagnosis not present

## 2020-06-02 DIAGNOSIS — E1136 Type 2 diabetes mellitus with diabetic cataract: Secondary | ICD-10-CM | POA: Diagnosis present

## 2020-06-02 DIAGNOSIS — R0989 Other specified symptoms and signs involving the circulatory and respiratory systems: Secondary | ICD-10-CM

## 2020-06-02 DIAGNOSIS — E1142 Type 2 diabetes mellitus with diabetic polyneuropathy: Secondary | ICD-10-CM | POA: Diagnosis not present

## 2020-06-02 DIAGNOSIS — I871 Compression of vein: Secondary | ICD-10-CM | POA: Diagnosis not present

## 2020-06-02 DIAGNOSIS — R29818 Other symptoms and signs involving the nervous system: Secondary | ICD-10-CM | POA: Diagnosis not present

## 2020-06-02 DIAGNOSIS — Z7401 Bed confinement status: Secondary | ICD-10-CM | POA: Diagnosis not present

## 2020-06-02 DIAGNOSIS — H9190 Unspecified hearing loss, unspecified ear: Secondary | ICD-10-CM | POA: Diagnosis present

## 2020-06-02 DIAGNOSIS — R079 Chest pain, unspecified: Secondary | ICD-10-CM | POA: Diagnosis not present

## 2020-06-02 DIAGNOSIS — Z955 Presence of coronary angioplasty implant and graft: Secondary | ICD-10-CM

## 2020-06-02 DIAGNOSIS — R7881 Bacteremia: Secondary | ICD-10-CM | POA: Diagnosis not present

## 2020-06-02 DIAGNOSIS — E0840 Diabetes mellitus due to underlying condition with diabetic neuropathy, unspecified: Secondary | ICD-10-CM | POA: Diagnosis not present

## 2020-06-02 DIAGNOSIS — I5030 Unspecified diastolic (congestive) heart failure: Secondary | ICD-10-CM | POA: Diagnosis not present

## 2020-06-02 DIAGNOSIS — N186 End stage renal disease: Secondary | ICD-10-CM

## 2020-06-02 DIAGNOSIS — J80 Acute respiratory distress syndrome: Secondary | ICD-10-CM | POA: Diagnosis not present

## 2020-06-02 DIAGNOSIS — Z743 Need for continuous supervision: Secondary | ICD-10-CM | POA: Diagnosis not present

## 2020-06-02 DIAGNOSIS — J9600 Acute respiratory failure, unspecified whether with hypoxia or hypercapnia: Secondary | ICD-10-CM

## 2020-06-02 DIAGNOSIS — Z87891 Personal history of nicotine dependence: Secondary | ICD-10-CM

## 2020-06-02 DIAGNOSIS — R4182 Altered mental status, unspecified: Secondary | ICD-10-CM | POA: Diagnosis not present

## 2020-06-02 DIAGNOSIS — I120 Hypertensive chronic kidney disease with stage 5 chronic kidney disease or end stage renal disease: Secondary | ICD-10-CM | POA: Diagnosis not present

## 2020-06-02 DIAGNOSIS — I132 Hypertensive heart and chronic kidney disease with heart failure and with stage 5 chronic kidney disease, or end stage renal disease: Secondary | ICD-10-CM | POA: Diagnosis present

## 2020-06-02 DIAGNOSIS — A419 Sepsis, unspecified organism: Secondary | ICD-10-CM | POA: Diagnosis not present

## 2020-06-02 DIAGNOSIS — R0781 Pleurodynia: Secondary | ICD-10-CM | POA: Diagnosis not present

## 2020-06-02 DIAGNOSIS — Z6832 Body mass index (BMI) 32.0-32.9, adult: Secondary | ICD-10-CM

## 2020-06-02 DIAGNOSIS — K861 Other chronic pancreatitis: Secondary | ICD-10-CM | POA: Diagnosis not present

## 2020-06-02 DIAGNOSIS — R0602 Shortness of breath: Secondary | ICD-10-CM | POA: Diagnosis present

## 2020-06-02 DIAGNOSIS — E869 Volume depletion, unspecified: Secondary | ICD-10-CM | POA: Diagnosis not present

## 2020-06-02 DIAGNOSIS — E1122 Type 2 diabetes mellitus with diabetic chronic kidney disease: Secondary | ICD-10-CM | POA: Diagnosis present

## 2020-06-02 DIAGNOSIS — I4891 Unspecified atrial fibrillation: Secondary | ICD-10-CM | POA: Diagnosis not present

## 2020-06-02 DIAGNOSIS — I700 Atherosclerosis of aorta: Secondary | ICD-10-CM | POA: Diagnosis not present

## 2020-06-02 DIAGNOSIS — R600 Localized edema: Secondary | ICD-10-CM | POA: Diagnosis not present

## 2020-06-02 DIAGNOSIS — T82590D Other mechanical complication of surgically created arteriovenous fistula, subsequent encounter: Secondary | ICD-10-CM | POA: Diagnosis not present

## 2020-06-02 DIAGNOSIS — R1311 Dysphagia, oral phase: Secondary | ICD-10-CM | POA: Diagnosis not present

## 2020-06-02 DIAGNOSIS — E8770 Fluid overload, unspecified: Secondary | ICD-10-CM | POA: Diagnosis not present

## 2020-06-02 DIAGNOSIS — E11649 Type 2 diabetes mellitus with hypoglycemia without coma: Secondary | ICD-10-CM | POA: Diagnosis not present

## 2020-06-02 DIAGNOSIS — I5031 Acute diastolic (congestive) heart failure: Secondary | ICD-10-CM | POA: Diagnosis not present

## 2020-06-02 DIAGNOSIS — E782 Mixed hyperlipidemia: Secondary | ICD-10-CM | POA: Diagnosis present

## 2020-06-02 DIAGNOSIS — I252 Old myocardial infarction: Secondary | ICD-10-CM

## 2020-06-02 DIAGNOSIS — I517 Cardiomegaly: Secondary | ICD-10-CM | POA: Diagnosis not present

## 2020-06-02 DIAGNOSIS — E785 Hyperlipidemia, unspecified: Secondary | ICD-10-CM | POA: Diagnosis present

## 2020-06-02 DIAGNOSIS — I639 Cerebral infarction, unspecified: Secondary | ICD-10-CM | POA: Diagnosis not present

## 2020-06-02 LAB — BASIC METABOLIC PANEL
Anion gap: 11 (ref 5–15)
BUN: 25 mg/dL — ABNORMAL HIGH (ref 8–23)
CO2: 28 mmol/L (ref 22–32)
Calcium: 8.7 mg/dL — ABNORMAL LOW (ref 8.9–10.3)
Chloride: 98 mmol/L (ref 98–111)
Creatinine, Ser: 6.68 mg/dL — ABNORMAL HIGH (ref 0.44–1.00)
GFR calc Af Amer: 7 mL/min — ABNORMAL LOW (ref >60)
GFR calc non Af Amer: 6 mL/min — ABNORMAL LOW (ref >60)
Glucose, Bld: 132 mg/dL — ABNORMAL HIGH (ref 70–99)
Potassium: 4.2 mmol/L (ref 3.5–5.1)
Sodium: 137 mmol/L (ref 135–145)

## 2020-06-02 LAB — CBC WITH DIFFERENTIAL/PLATELET
Abs Immature Granulocytes: 0.02 10*3/uL (ref 0.00–0.07)
Basophils Absolute: 0.1 10*3/uL (ref 0.0–0.1)
Basophils Relative: 1 %
Eosinophils Absolute: 1.3 10*3/uL — ABNORMAL HIGH (ref 0.0–0.5)
Eosinophils Relative: 24 %
HCT: 26.5 % — ABNORMAL LOW (ref 36.0–46.0)
Hemoglobin: 8.9 g/dL — ABNORMAL LOW (ref 12.0–15.0)
Immature Granulocytes: 0 %
Lymphocytes Relative: 32 %
Lymphs Abs: 1.7 10*3/uL (ref 0.7–4.0)
MCH: 29.8 pg (ref 26.0–34.0)
MCHC: 33.6 g/dL (ref 30.0–36.0)
MCV: 88.6 fL (ref 80.0–100.0)
Monocytes Absolute: 0.3 10*3/uL (ref 0.1–1.0)
Monocytes Relative: 6 %
Neutro Abs: 2 10*3/uL (ref 1.7–7.7)
Neutrophils Relative %: 37 %
Platelets: 130 10*3/uL — ABNORMAL LOW (ref 150–400)
RBC: 2.99 MIL/uL — ABNORMAL LOW (ref 3.87–5.11)
RDW: 15.9 % — ABNORMAL HIGH (ref 11.5–15.5)
WBC: 5.4 10*3/uL (ref 4.0–10.5)
nRBC: 0 % (ref 0.0–0.2)

## 2020-06-02 MED ORDER — HYDROCODONE-ACETAMINOPHEN 5-325 MG PO TABS
1.0000 | ORAL_TABLET | Freq: Once | ORAL | Status: AC
  Administered 2020-06-02: 1 via ORAL
  Filled 2020-06-02: qty 1

## 2020-06-02 MED ORDER — PROMETHAZINE HCL 25 MG/ML IJ SOLN
12.5000 mg | Freq: Four times a day (QID) | INTRAMUSCULAR | Status: DC | PRN
  Administered 2020-06-03 – 2020-06-22 (×16): 12.5 mg via INTRAVENOUS
  Filled 2020-06-02 (×18): qty 1

## 2020-06-02 MED ORDER — ONDANSETRON HCL 4 MG PO TABS
4.0000 mg | ORAL_TABLET | Freq: Four times a day (QID) | ORAL | Status: DC | PRN
  Filled 2020-06-02 (×3): qty 1

## 2020-06-02 MED ORDER — HYDRALAZINE HCL 20 MG/ML IJ SOLN
10.0000 mg | INTRAMUSCULAR | Status: DC | PRN
  Administered 2020-06-03 – 2020-06-09 (×4): 10 mg via INTRAVENOUS
  Filled 2020-06-02 (×3): qty 1

## 2020-06-02 MED ORDER — CALCIUM CARBONATE ANTACID 500 MG PO CHEW: 500.0000 mg | CHEWABLE_TABLET | Freq: Four times a day (QID) | ORAL | Status: DC | PRN

## 2020-06-02 MED ORDER — CAMPHOR-MENTHOL 0.5-0.5 % EX LOTN
1.0000 "application " | TOPICAL_LOTION | Freq: Three times a day (TID) | CUTANEOUS | Status: DC | PRN
  Filled 2020-06-02: qty 222

## 2020-06-02 MED ORDER — HEPARIN SODIUM (PORCINE) 5000 UNIT/ML IJ SOLN
5000.0000 [IU] | Freq: Three times a day (TID) | INTRAMUSCULAR | Status: DC
  Administered 2020-06-03 – 2020-06-25 (×58): 5000 [IU] via SUBCUTANEOUS
  Filled 2020-06-02 (×58): qty 1

## 2020-06-02 MED ORDER — ACETAMINOPHEN 650 MG RE SUPP: 650.0000 mg | Freq: Four times a day (QID) | RECTAL | Status: DC | PRN

## 2020-06-02 MED ORDER — HYDROXYZINE HCL 25 MG PO TABS
25.0000 mg | ORAL_TABLET | Freq: Three times a day (TID) | ORAL | Status: DC | PRN
  Administered 2020-06-03 – 2020-06-20 (×2): 25 mg via ORAL
  Filled 2020-06-02 (×3): qty 1

## 2020-06-02 MED ORDER — SORBITOL 70 % SOLN
30.0000 mL | Status: DC | PRN
  Filled 2020-06-02: qty 30

## 2020-06-02 MED ORDER — ACETAMINOPHEN 325 MG PO TABS
650.0000 mg | ORAL_TABLET | Freq: Four times a day (QID) | ORAL | Status: DC | PRN
  Administered 2020-06-03 – 2020-06-06 (×4): 650 mg via ORAL
  Filled 2020-06-02 (×3): qty 2

## 2020-06-02 MED ORDER — NEPRO/CARBSTEADY PO LIQD: 237.0000 mL | Freq: Three times a day (TID) | ORAL | Status: DC | PRN

## 2020-06-02 MED ORDER — DOCUSATE SODIUM 283 MG RE ENEM: 1.0000 | ENEMA | RECTAL | Status: DC | PRN

## 2020-06-02 MED ORDER — LIDOCAINE HCL (PF) 1 % IJ SOLN
5.0000 mL | Freq: Once | INTRAMUSCULAR | Status: AC
  Administered 2020-06-02: 5 mL via INTRADERMAL
  Filled 2020-06-02: qty 5

## 2020-06-02 MED ORDER — ZOLPIDEM TARTRATE 5 MG PO TABS
5.0000 mg | ORAL_TABLET | Freq: Every evening | ORAL | Status: DC | PRN
  Administered 2020-06-17: 5 mg via ORAL
  Filled 2020-06-02: qty 1

## 2020-06-02 MED ORDER — ONDANSETRON HCL 4 MG/2ML IJ SOLN
4.0000 mg | Freq: Four times a day (QID) | INTRAMUSCULAR | Status: DC | PRN
  Administered 2020-06-03 – 2020-06-05 (×2): 4 mg via INTRAVENOUS
  Filled 2020-06-02 (×5): qty 2

## 2020-06-02 NOTE — ED Notes (Signed)
This RN called dialysis to inform them pt is in the room and ready for dialysis tx

## 2020-06-02 NOTE — H&P (Signed)
History and Physical   [name] WSF:[id number] DOB: 26-Dec-1960 DOA: 06/02/2020  Referring MD/NP/PA: Dr. Quentin Cornwall  [facility]P: [facility]   Outpatient Specialists: Dr. Zollie Scale  Patient coming from: Home  Chief Complaint: Shortness of breath and chest pain  HPI: [name] is a 59 y.o. female with medical history significant of end-stage renal disease on hemodialysis Mondays Wednesdays and Fridays, hypertension, COPD, diastolic dysfunction CHF, depression, diabetes, hyperlipidemia, who apparently missed her dialysis on Friday.  Came to the ER with complaining of significant shortness of breath and cough.  Patient was noted to have evidence of fluid overload.  Blood pressure also in the 200s with obvious hypertensive urgency.  Nephrology consulted and plan to do hemodialysis tonight.  Patient being admitted to the medical service for observation.  She is complaining of significant pain back.  Also having significant headaches..  ED Course: Temperature 97.9 blood pressure 128/86 pulse 67 respirate 23 oxygen sat 93% on room air.  White count 5.4 hemoglobin 8.8 and platelets 130.  Creatinine 6.60 BUN 25 calcium 8.7.  Rest of chemistry appeared to be within normal.  Chest x-ray showed features of CHF with cardiomegaly.  Also small bilateral pleural effusions.  Patient being admitted for hemodialysis.  Review of Systems: As per HPI otherwise 10 point review of systems negative.    Past Medical History:  Diagnosis Date  . Anemia   . Anginal pain ([place])   . Anxiety   . Arthritis   . Asthma   . Broken wrist   . Bronchitis   . chronic diastolic CHF 0/17/4944  . Chronic kidney disease   . COPD (chronic obstructive pulmonary disease) ([place])   . Coronary artery disease    a. cath 2013: stenting to RCA (report not available); b. cath 2014: LM nl, pLAD 40%, mLAD nl, ost LCx 40%, mid LCx nl, pRCA 30% @ site of prior stent, mRCA 50%  . Depression   . Diabetes mellitus ([place])   .  Diabetes mellitus without complication ([place])   . Diabetic neuropathy ([place])   . dialysis 2006  . Diverticulosis   . Dizziness   . Dyspnea   . Elevated lipids   . Environmental and seasonal allergies   . ESRD (end stage renal disease) on dialysis ([place])    M-W-F  . Gastroparesis   . GERD (gastroesophageal reflux disease)   . Headache   . History of anemia due to chronic kidney disease   . History of hiatal hernia   . HOH (hard of hearing)   . Hx of pancreatitis 2015  . Hypertension   . Lower extremity edema   . Mitral regurgitation    a. echo 10/2013: EF 62%, noWMA, mildly dilated LA, mild to mod MR/TR, GR1DD  . Myocardial infarction ([place])   . Orthopnea   . Parathyroid abnormality ([place])   . Peripheral arterial disease ([place])   . Pneumonia   . Renal cancer ([place])   . Renal insufficiency    Pt is on dialysis on M,W + F.  . Wheezing     Past Surgical History:  Procedure Laterality Date  . A/V SHUNTOGRAM Left 01/20/2018   Procedure: A/V SHUNTOGRAM;  Surgeon: Algernon Huxley, MD;  Location: [place] [facility];  Service: Cardiovascular;  Laterality: Left;  . ABDOMINAL HYSTERECTOMY  1992  . AMPUTATION TOE Left 10/02/2017   Procedure: AMPUTATION TOE-LEFT GREAT TOE;  Surgeon: Albertine Patricia, DPM;  Location: [facility];  Service: Podiatry;  Laterality: Left;  .  APPENDECTOMY    . APPLICATION OF WOUND VAC N/A 11/25/2019   Procedure: APPLICATION OF WOUND VAC;  Surgeon: Katha Cabal, MD;  Location: [facility];  Service: Vascular;  Laterality: N/A;  . ARTERY BIOPSY Right 10/11/2018   Procedure: BIOPSY TEMPORAL ARTERY;  Surgeon: Vickie Epley, MD;  Location: [facility];  Service: General;  Laterality: Right;  . CARDIAC CATHETERIZATION Left 07/26/2015   Procedure: Left Heart Cath and Coronary Angiography;  Surgeon: Dionisio David, MD;  Location: [place] [facility];  Service: Cardiovascular;  Laterality: Left;  . CATARACT EXTRACTION W/ INTRAOCULAR LENS IMPLANT Right   . CATARACT EXTRACTION  W/PHACO Left 03/10/2017   Procedure: CATARACT EXTRACTION PHACO AND INTRAOCULAR LENS PLACEMENT (IOC);  Surgeon: Birder Robson, MD;  Location: [facility];  Service: Ophthalmology;  Laterality: Left;  [place] 00:51.9 AP% 14.2 CDE 7.39 fluid pack lot # [id number]  . CENTRAL LINE INSERTION Right 11/11/2019   Procedure: CENTRAL LINE INSERTION;  Surgeon: Katha Cabal, MD;  Location: [facility];  Service: Vascular;  Laterality: Right;  . CENTRAL LINE INSERTION  11/25/2019   Procedure: CENTRAL LINE INSERTION;  Surgeon: Katha Cabal, MD;  Location: [facility];  Service: Vascular;;  . CHOLECYSTECTOMY    . COLONOSCOPY WITH PROPOFOL N/A 08/12/2016   Procedure: COLONOSCOPY WITH PROPOFOL;  Surgeon: Lollie Sails, MD;  Location: [facility] ENDOSCOPY;  Service: Endoscopy;  Laterality: N/A;  . DIALYSIS FISTULA CREATION Left    upper arm  . dialysis grafts    . DIALYSIS/PERMA CATHETER INSERTION N/A 11/14/2019   Procedure: DIALYSIS/PERMA CATHETER INSERTION;  Surgeon: Algernon Huxley, MD;  Location: [place] [facility];  Service: Cardiovascular;  Laterality: N/A;  . DIALYSIS/PERMA CATHETER INSERTION N/A 02/03/2020   Procedure: DIALYSIS/PERMA CATHETER INSERTION;  Surgeon: Katha Cabal, MD;  Location: [place] [facility];  Service: Cardiovascular;  Laterality: N/A;  . DIALYSIS/PERMA CATHETER REMOVAL N/A 05/25/2020   Procedure: DIALYSIS/PERMA CATHETER REMOVAL;  Surgeon: Katha Cabal, MD;  Location: [place] [facility];  Service: Cardiovascular;  Laterality: N/A;  . ESOPHAGOGASTRODUODENOSCOPY N/A 03/08/2015   Procedure: ESOPHAGOGASTRODUODENOSCOPY (EGD);  Surgeon: Manya Silvas, MD;  Location[facility];  Service: Endoscopy;  Laterality: N/A;  . ESOPHAGOGASTRODUODENOSCOPY (EGD) WITH PROPOFOL N/A 03/18/2016   Procedure: ESOPHAGOGASTRODUODENOSCOPY (EGD) WITH PROPOFOL;  Surgeon: [name], MD;  Location: [facility] ENDOSCOPY;  Service: Endoscopy;  Laterality: N/A;  . EYE SURGERY Right 2018  . FECAL TRANSPLANT  N/A 08/23/2015   Procedure: FECAL TRANSPLANT;  Surgeon: Manya Silvas, MD;  Location: [facility] ENDOSCOPY;  Service: Endoscopy;  Laterality: N/A;  . HAND SURGERY Bilateral   . HEMATOMA EVACUATION Left 11/25/2019   Procedure: EVACUATION HEMATOMA;  Surgeon: Katha Cabal, MD;  Location: [facility];  Service: Vascular;  Laterality: Left;  . I & D EXTREMITY Left 11/25/2019   Procedure: IRRIGATION AND DEBRIDEMENT EXTREMITY;  Surgeon: Katha Cabal, MD;  Location: [facility];  Service: Vascular;  Laterality: Left;  . IR FLUORO GUIDE CV LINE RIGHT  04/06/2020  . IR RADIOLOGIST EVAL & MGMT  07/28/2019  . IR RADIOLOGIST EVAL & MGMT  08/11/2019  . LIGATION OF ARTERIOVENOUS  FISTULA Left 11/11/2019  . LIGATION OF ARTERIOVENOUS  FISTULA Left 11/11/2019   Procedure: LIGATION OF ARTERIOVENOUS  FISTULA;  Surgeon: Katha Cabal, MD;  Location: [facility];  Service: Vascular;  Laterality: Left;  . PERIPHERAL VASCULAR CATHETERIZATION N/A 12/20/2015   Procedure: Thrombectomy of dialysis access versus permcath placement;  Surgeon: Algernon Huxley, MD;  Location: [facility]  INVASIVE CV LAB;  Service: Cardiovascular;  Laterality: N/A;  . PERIPHERAL VASCULAR CATHETERIZATION N/A 12/20/2015   Procedure: A/V Shunt Intervention;  Surgeon: Algernon Huxley, MD;  Location: [place] [facility] LAB;  Service: Cardiovascular;  Laterality: N/A;  . PERIPHERAL VASCULAR CATHETERIZATION N/A 12/20/2015   Procedure: A/V Shuntogram/Fistulagram;  Surgeon: Algernon Huxley, MD;  Location: [place] [facility];  Service: Cardiovascular;  Laterality: N/A;  . PERIPHERAL VASCULAR CATHETERIZATION N/A 01/02/2016   Procedure: A/V Shuntogram/Fistulagram;  Surgeon: Algernon Huxley, MD;  Location: [place] [facility];  Service: Cardiovascular;  Laterality: N/A;  . PERIPHERAL VASCULAR CATHETERIZATION N/A 01/02/2016   Procedure: A/V Shunt Intervention;  Surgeon: Algernon Huxley, MD;  Location: [place] [facility];  Service: Cardiovascular;  Laterality: N/A;  . UPPER EXTREMITY  VENOGRAPHY Right 01/18/2020   Procedure: UPPER EXTREMITY VENOGRAPHY;  Surgeon: Katha Cabal, MD;  Location: [place] [facility];  Service: Cardiovascular;  Laterality: Right;  Marland Kitchen VASCULAR ACCESS DEVICE INSERTION Right 04/13/2020   Procedure: INSERTION OF HERO VASCULAR ACCESS DEVICE (GRAFT);  Surgeon: Katha Cabal, MD;  Location: [facility] OR[facility];  Service: Vascular;  Laterality: Right;     reports that she has never smoked. She has never used smokeless tobacco. She reports previous alcohol use. She reports current drug use. Drug: Marijuana.  Allergies  Allergen Reactions  . Ace Inhibitors Swelling and Anaphylaxis  . Ativan [Lorazepam] Other (See Comments)    Reaction:Hallucinations and headaches  . Compazine [Prochlorperazine Edisylate] Anaphylaxis, Nausea And Vomiting and Other (See Comments)    Other reaction(s): dystonia from this vs. Reglan, 23 Jul - patient relates that she takes promethazine frequently with no problems  . Sumatriptan Succinate Other (See Comments)    Other reaction(s): delirium and hallucinations per [facility] records  . Zofran [Ondansetron] Nausea And Vomiting    Per pt. she is allergic to zofran or will experience adverse reaction like hallucination   . [place] Nausea And Vomiting  . Losartan Nausea Only  . Prochlorperazine Other (See Comments)    Reaction:  Unknown . Patient does not remember reaction but she does have vertigo and anxiety along with n and v at times. Could be used to treat any of these   . Reglan [Metoclopramide] Other (See Comments)    Per patient her Dr. Evelina Bucy her off it   . Scopolamine Other (See Comments)    Dizziness, also has vertigo already  . Tape Rash    Plastic tape causes rash  . Tapentadol Rash    Family History  Problem Relation Age of Onset  . Kidney disease Mother   . Diabetes Mother   . Cancer Father   . Kidney disease Sister      Prior to Admission medications   Medication Sig Start Date End Date Taking?  Authorizing Provider  albuterol (PROVENTIL) (2.5 MG/3ML) 0.083% nebulizer solution Take 2.5 mg by nebulization every 4 (four) hours as needed for wheezing or shortness of breath.    [provider]  albuterol (VENTOLIN HFA) 108 (90 Base) MCG/ACT inhaler Inhale 2 puffs into the lungs every 4 (four) hours as needed for wheezing or shortness of breath. 11/09/19   [provider]  ammonium lactate (LAC-HYDRIN) 12 % lotion Apply 1 application topically 2 (two) times daily as needed for dry skin.     [provider]  Ascorbic Acid (VITA-C PO) Take 1 tablet by mouth daily.    [provider]  aspirin EC 81 MG tablet Take 81 mg  by mouth daily.    [provider]  atorvastatin (LIPITOR) 20 MG tablet Take 20 mg by mouth every evening.  10/24/19   [provider]  calcium acetate (PHOSLO) 667 MG capsule Take 667-2,001 mg by mouth See admin instructions. Take 3 capsules (2001mg ) by mouth three times a day with meals and take 1 capsule (667mg ) by mouth daily with a snack    [provider]  carvedilol (COREG) 6.25 MG tablet Take 6.25 mg by mouth 2 (two) times daily.  10/24/19   [provider]  Cholecalciferol (VITAMIN D3) 25 MCG (1000 UT) CAPS Take 1,000 Units by mouth daily.     [provider]  cholestyramine light (PREVALITE) 4 g packet Take 4 g by mouth 2 (two) times daily.    [provider]  CREON 12000 units CPEP capsule Take 12,000-36,000 Units by mouth See admin instructions. Take 3 capsules (36,000u) by mouth three times daily with meals and take 1 capsule (12,000u) by mouth daily with a snack 10/21/19   [provider]  cyanocobalamin 1000 MCG tablet Take 1,000 mcg by mouth daily.     [provider]  diphenhydrAMINE (BENADRYL) 25 MG tablet Take 25 mg by mouth every 6 (six) hours as needed for itching.    [provider]  Ferrous Sulfate (IRON) 325 (65 Fe) MG TABS Take 325 mg by mouth daily.      [provider]  furosemide (LASIX) 80 MG tablet Take 80 mg by mouth daily. 05/08/20   [provider]  gabapentin (NEURONTIN) 100 MG capsule Take 100 mg by mouth every evening.     [provider]  HYDROcodone-acetaminophen (NORCO/VICODIN) 5-325 MG tablet Take 1 tablet by mouth 4 (four) times daily as needed for moderate pain or severe pain.     [provider]  hydrOXYzine (ATARAX/VISTARIL) 25 MG tablet Take 25 mg by mouth daily.     [provider]  insulin aspart (NOVOLOG) 100 UNIT/ML injection Inject 4-15 Units into the skin in the morning, at noon, in the evening, and at bedtime. Sliding scale    [provider]  insulin detemir (LEVEMIR) 100 UNIT/ML injection Inject 67 Units into the skin at bedtime.    [provider]  meclizine (ANTIVERT) 25 MG tablet Take 25 mg by mouth every 6 (six) hours.  11/01/19   [provider]  multivitamin (RENA-VIT) TABS tablet Take 1 tablet by mouth daily.     [provider]  mupirocin ointment (BACTROBAN) 2 % Apply 1 application topically daily as needed (leg rash).  11/09/19   [provider]  nitroGLYCERIN (NITROSTAT) 0.4 MG SL tablet Place 0.4 mg under the tongue every 5 (five) minutes as needed for chest pain.     [provider]  Omega-3 300 MG CAPS Take 300 mg by mouth 2 (two) times daily. Every other day opposite omega XL    [provider]  pantoprazole (PROTONIX) 40 MG tablet Take 40 mg by mouth daily. 08/28/19   [provider]  Probiotic Product (PROBIOTIC PO) Take 1 capsule by mouth daily.    [provider]  sevelamer carbonate (RENVELA) 800 MG tablet Take 800 mg by mouth 4 (four) times daily. (at meals and with a snack)    [provider]  traMADol (ULTRAM) 50 MG tablet Take 1 tablet (50 mg total) by mouth every 8 (eight) hours as needed for moderate pain or severe pain. 05/21/20   Schnier, Dolores Lory, MD  Physical  Exam: Vitals:   06/02/20 1056 06/02/20 1414 06/02/20 1833 06/02/20 2130  BP:  (!) 182/73 (!) 170/68 (!) 203/89  Pulse:  66 63 65  Resp:  12 20 15   Temp:   97.7 F (36.5 C)   TempSrc:   Oral   SpO2:  96% 97% 93%  Weight: 80.7 kg     Height: 5\' 5"  (1.651 m)         Constitutional: Acutely ill looking, in obvious respiratory distress Vitals:   06/02/20 1056 06/02/20 1414 06/02/20 1833 06/02/20 2130  BP:  (!) 182/73 (!) 170/68 (!) 203/89  Pulse:  66 63 65  Resp:  12 20 15   Temp:   97.7 F (36.5 C)   TempSrc:   Oral   SpO2:  96% 97% 93%  Weight: 80.7 kg     Height: 5\' 5"  (1.651 m)      Eyes: PERRL, lids and conjunctivae normal ENMT: Mucous membranes are moist. Posterior pharynx clear of any exudate or lesions.Normal dentition.  Neck: normal, supple, no masses, no thyromegaly Respiratory: Decreased air entry with basal crackles and rhonchi normal respiratory effort. No accessory muscle use.  Cardiovascular: Sinus tachycardia no murmurs / rubs / gallops. No extremity edema. 2+ pedal pulses. No carotid bruits.  Abdomen: no tenderness, no masses palpated. No hepatosplenomegaly. Bowel sounds positive.  Musculoskeletal: no clubbing / cyanosis. No joint deformity upper and lower extremities. Good ROM, no contractures. Normal muscle tone.  Skin: no rashes, lesions, ulcers. No induration Neurologic: CN 2-12 grossly intact. Sensation intact, DTR normal. Strength 5/5 in all 4.  Psychiatric: Normal judgment and insight. Alert and oriented x 3.  Anxious mood.     Labs on Admission: I have personally reviewed following labs and imaging studies  CBC: Recent Labs  Lab 06/02/20 1907  WBC 5.4  NEUTROABS 2.0  HGB 8.9*  HCT 26.5*  MCV 88.6  PLT 027*   Basic Metabolic Panel: Recent Labs  Lab 06/02/20 1907  NA 137  K 4.2  CL 98  CO2 28  GLUCOSE 132*  BUN 25*  CREATININE 6.68*  CALCIUM 8.7*   GFR: Estimated Creatinine Clearance: 9.3 mL/min (A) (by C-G formula based on SCr of  6.68 mg/dL (H)). Liver Function Tests: No results for input(s): AST, ALT, ALKPHOS, BILITOT, PROT, ALBUMIN in the last 168 hours. No results for input(s): LIPASE, AMYLASE in the last 168 hours. No results for input(s): AMMONIA in the last 168 hours. Coagulation Profile: No results for input(s): INR, PROTIME in the last 168 hours. Cardiac Enzymes: No results for input(s): CKTOTAL, CKMB, CKMBINDEX, TROPONINI in the last 168 hours. BNP (last 3 results) No results for input(s): PROBNP in the last 8760 hours. HbA1C: No results for input(s): HGBA1C in the last 72 hours. CBG: No results for input(s): GLUCAP in the last 168 hours. Lipid Profile: No results for input(s): CHOL, HDL, LDLCALC, TRIG, CHOLHDL, LDLDIRECT in the last 72 hours. Thyroid Function Tests: No results for input(s): TSH, T4TOTAL, FREET4, T3FREE, THYROIDAB in the last 72 hours. Anemia Panel: No results for input(s): VITAMINB12, FOLATE, FERRITIN, TIBC, IRON, RETICCTPCT in the last 72 hours. Urine analysis:    Component Value Date/Time   COLORURINE YELLOW (A) 06/09/2017 1039   APPEARANCEUR CLEAR (A) 06/09/2017 1039   APPEARANCEUR Clear 09/15/2014 0947   LABSPEC 1.014 06/09/2017 1039   LABSPEC 1.008 09/15/2014 0947   PHURINE 8.0 06/09/2017 1039   GLUCOSEU 150 (A) 06/09/2017 1039   GLUCOSEU 150 mg/dL 09/15/2014 0947  HGBUR SMALL (A) 06/09/2017 1039   BILIRUBINUR NEGATIVE 06/09/2017 1039   BILIRUBINUR Negative 09/15/2014 0947   KETONESUR NEGATIVE 06/09/2017 1039   PROTEINUR >=300 (A) 06/09/2017 1039   UROBILINOGEN 0.2 06/30/2010 2130   NITRITE NEGATIVE 06/09/2017 1039   LEUKOCYTESUR NEGATIVE 06/09/2017 1039   LEUKOCYTESUR Negative 09/15/2014 0947   Sepsis Labs: @LABRCNTIP (procalcitonin:4,lacticidven:4) )No results found for this or any previous visit (from the past 240 hour(s)).   Radiological Exams on Admission: DG Chest Portable 1 View  Result Date: 06/02/2020 CLINICAL DATA:  Shortness of breath EXAM: PORTABLE  CHEST 1 VIEW COMPARISON:  CT 05/22/2020, radiograph 05/22/2020 FINDINGS: Bilateral pleural thickening and small basilar effusions. Mixed interstitial and patchy opacities towards the mid to lower lungs likely reflecting a combination of interstitial and developing alveolar edema with central cuffing, vascular cephalization and congestion. Cardiomegaly is similar to priors. Evidence of prior bilateral vascular stenting in the left brachiocephalic/subclavian vein and right upper arm. A right upper extremity HeRO graft is also noted, terminating at the level the right atrium. Removal of a right lower extremity central venous catheter. Telemetry leads overlie the chest. Mild body wall edema. No other acute or suspicious soft tissue or osseous abnormality. Degenerative changes are present in the imaged spine and shoulders. IMPRESSION: Features of CHF/volume overload with cardiomegaly, edema and small bilateral effusions. More patchy opacities could reflect alveolar edema versus underlying infection. Electronically Signed   By: Lovena Le M.D.   On: 06/02/2020 18:37      Assessment/Plan Principal Problem:   ESRD on hemodialysis ([place]) Active Problems:   Anemia of chronic disease   Acute diastolic CHF (congestive heart failure) (HCC)   COPD (chronic obstructive pulmonary disease) (HCC)   Benign essential HTN     #1 end-stage renal disease with fluid overload: Secondary to failed dialysis.  Patient will be admitted for observation.  Hemodialysis to be ordered by Dr. Holley Raring tonight.  We will follow patient thereafter.  She is having hypertensive urgency.  Initiate hydralazine in addition to her regular home regimen.  #2 anemia of chronic disease: Hemoglobin 8.9.  Monitor for now.  #3 COPD: No acute exacerbation.  Continue treatment.  #4 malignant hypertension: Again we will treat as per above.  Patient is having some hypertensive urgency probably related to her missed dialysis.  #5 diastolic heart  failure: Again related to high end-stage renal disease.  Continue with hemodialysis   DVT prophylaxis:  Heparin  Code Status: Full code Family Communication: No family at bedside Disposition Plan: Home Consults called: Dr. Zollie Scale, nephrology Admission status: Observation  Severity of Illness: The appropriate patient status for this patient is OBSERVATION. Observation status is judged to be reasonable and necessary in order to provide the required intensity of service to ensure the patient's safety. The patient's presenting symptoms, physical exam findings, and initial radiographic and laboratory data in the context of their medical condition is felt to place them at decreased risk for further clinical deterioration. Furthermore, it is anticipated that the patient will be medically stable for discharge from the hospital within 2 midnights of admission. The following factors support the patient status of observation.   " The patient's presenting symptoms include shortness of breath. " The physical exam findings include evidence of fluid overload. " The initial radiographic and laboratory data are pulmonary edema with pleural effusion.     Barbette Merino MD [facility] Pager [phone]  If 7PM-7AM, please contact night-coverage [email or web address] Password [facility]  06/02/2020, 9:41 PM

## 2020-06-02 NOTE — ED Notes (Signed)
Pt requests labs/IV not be drawn by this nurse. Will notify ED provider or IV team per provider discretion.

## 2020-06-02 NOTE — ED Notes (Signed)
Pt to ED via EMS from [facility] for ShOB, pt had x-ray that showed fluid around her heart and lungs. Pt is in NAD.   BP with EMS 201/94

## 2020-06-02 NOTE — ED Notes (Signed)
Pt in lobby yelling out that she cannot breath. RN updated pts vital signs and reassured pt that her SpO2 is WNL. Pt informed we are still waiting on a room for her to see MD. Pt given another warm blanket.

## 2020-06-02 NOTE — ED Triage Notes (Signed)
Pt to ED via [facility] from [facility] urgent care for fluid around her lungs and heart. Pt was seen urgent care for chest pain and shortness of breath. Pt is a dialysis pt, last treatment on Wednesday. Pt states that they told her not to come yesterday because she was having diarrhea. Pt is in NAD.

## 2020-06-02 NOTE — ED Notes (Signed)
Pt request to wait for labs until IV is started due to being a hard stick

## 2020-06-02 NOTE — ED Notes (Signed)
Pt states intermittent diarrhea since bowel surgery about 6 years ago. One episode of vomiting last 24 hours.

## 2020-06-02 NOTE — ED Notes (Signed)
Pt receiving dialysis and called out due to increasing SOB and CP. Pt also stating she is nauseas and threw up. NP made aware. No new orders at this time.

## 2020-06-02 NOTE — ED Notes (Signed)
ED provider at bedside.

## 2020-06-02 NOTE — ED Provider Notes (Signed)
[facility] Emergency Department Provider Note    First MD Initiated Contact with Patient 06/02/20 1807     (approximate)  I have reviewed the triage vital signs and the nursing notes.   HISTORY  Chief Complaint Shortness of Breath    HPI [name] is a 59 y.o. female with the below listed past medical history presents to the ER for evaluation of shortness of breath.  Is also been having multiple episodes of watery diarrhea over the past few days.  Last dialysis was on Wednesday.  She was unable to get a dialysis yesterday due to diarrhea has been having worsening shortness of breath.  No measured fevers.  Denies any abdominal pain.  No chest pain or pressure.  Does not wear home oxygen.    Past Medical History:  Diagnosis Date  . Anemia   . Anginal pain ([place])   . Anxiety   . Arthritis   . Asthma   . Broken wrist   . Bronchitis   . chronic diastolic CHF 05/15/2352  . Chronic kidney disease   . COPD (chronic obstructive pulmonary disease) ([place])   . Coronary artery disease    a. cath 2013: stenting to RCA (report not available); b. cath 2014: LM nl, pLAD 40%, mLAD nl, ost LCx 40%, mid LCx nl, pRCA 30% @ site of prior stent, mRCA 50%  . Depression   . Diabetes mellitus ([place])   . Diabetes mellitus without complication ([place])   . Diabetic neuropathy ([place])   . dialysis 2006  . Diverticulosis   . Dizziness   . Dyspnea   . Elevated lipids   . Environmental and seasonal allergies   . ESRD (end stage renal disease) on dialysis ([place])    M-W-F  . Gastroparesis   . GERD (gastroesophageal reflux disease)   . Headache   . History of anemia due to chronic kidney disease   . History of hiatal hernia   . HOH (hard of hearing)   . Hx of pancreatitis 2015  . Hypertension   . Lower extremity edema   . Mitral regurgitation    a. echo 10/2013: EF 62%, noWMA, mildly dilated LA, mild to mod MR/TR, GR1DD  . Myocardial infarction ([place])   . Orthopnea   .  Parathyroid abnormality ([place])   . Peripheral arterial disease ([place])   . Pneumonia   . Renal cancer ([place])   . Renal insufficiency    Pt is on dialysis on M,W + F.  . Wheezing    Family History  Problem Relation Age of Onset  . Kidney disease Mother   . Diabetes Mother   . Cancer Father   . Kidney disease Sister    Past Surgical History:  Procedure Laterality Date  . A/V SHUNTOGRAM Left 01/20/2018   Procedure: A/V SHUNTOGRAM;  Surgeon: Algernon Huxley, MD;  Location: [place] [facility];  Service: Cardiovascular;  Laterality: Left;  . ABDOMINAL HYSTERECTOMY  1992  . AMPUTATION TOE Left 10/02/2017   Procedure: AMPUTATION TOE-LEFT GREAT TOE;  Surgeon: Albertine Patricia, DPM;  Location: [facility];  Service: Podiatry;  Laterality: Left;  . APPENDECTOMY    . APPLICATION OF WOUND VAC N/A 11/25/2019   Procedure: APPLICATION OF WOUND VAC;  Surgeon: Katha Cabal, MD;  Location: [facility];  Service: Vascular;  Laterality: N/A;  . ARTERY BIOPSY Right 10/11/2018   Procedure: BIOPSY TEMPORAL ARTERY;  Surgeon: Vickie Epley, MD;  Location: [facility];  Service: General;  Laterality: Right;  . CARDIAC CATHETERIZATION Left 07/26/2015   Procedure: Left Heart Cath and Coronary Angiography;  Surgeon: Dionisio David, MD;  Location: [place] [facility];  Service: Cardiovascular;  Laterality: Left;  . CATARACT EXTRACTION W/ INTRAOCULAR LENS IMPLANT Right   . CATARACT EXTRACTION W/PHACO Left 03/10/2017   Procedure: CATARACT EXTRACTION PHACO AND INTRAOCULAR LENS PLACEMENT (IOC);  Surgeon: Birder Robson, MD;  Location: [facility];  Service: Ophthalmology;  Laterality: Left;  [place] 00:51.9 AP% 14.2 CDE 7.39 fluid pack lot # [id number]  . CENTRAL LINE INSERTION Right 11/11/2019   Procedure: CENTRAL LINE INSERTION;  Surgeon: Katha Cabal, MD;  Location: [facility];  Service: Vascular;  Laterality: Right;  . CENTRAL LINE INSERTION  11/25/2019   Procedure: CENTRAL LINE INSERTION;  Surgeon: Katha Cabal, MD;   Location: [facility];  Service: Vascular;;  . CHOLECYSTECTOMY    . COLONOSCOPY WITH PROPOFOL N/A 08/12/2016   Procedure: COLONOSCOPY WITH PROPOFOL;  Surgeon: Lollie Sails, MD;  Location: [facility] ENDOS[facility];  Service: Endoscopy;  Laterality: N/A;  . DIALYSIS FISTULA CREATION Left    upper arm  . dialysis grafts    . DIALYSIS/PERMA CATHETER INSERTION N/A 11/14/2019   Procedure: DIALYSIS/PERMA CATHETER INSERTION;  Surgeon: Algernon Huxley, MD;  Location: [place] [facility];  Service: Cardiovascular;  Laterality: N/A;  . DIALYSIS/PERMA CATHETER INSERTION N/A 02/03/2020   Procedure: DIALYSIS/PERMA CATHETER INSERTION;  Surgeon: Katha Cabal, MD;  Location: [place] [facility];  Service: Cardiovascular;  Laterality: N/A;  . DIALYSIS/PERMA CATHETER REMOVAL N/A 05/25/2020   Procedure: DIALYSIS/PERMA CATHETER REMOVAL;  Surgeon: Katha Cabal, MD;  Location: [place] [facility];  Service: Cardiovascular;  Laterality: N/A;  . ESOPHAGOGASTRODUODENOSCOPY N/A 03/08/2015   Procedure: ESOPHAGOGASTRODUODENOSCOPY (EGD);  Surgeon: Manya Silvas, MD;  Location: [facility] ENDOSCOPY;  Service: Endoscopy;  Laterality: N/A;  . ESOPHAGOGASTRODUODENOSCOPY (EGD) WITH PROPOFOL N/A 03/18/2016   Procedure: ESOPHAGOGASTRODUODENOSCOPY (EGD) WITH PROPOFOL;  Surgeon: [name], MD;  Location: [facility] ENDOSCOPY;  Service: Endoscopy;  Laterality: N/A;  . EYE SURGERY Right 2018  . FECAL TRANSPLANT N/A 08/23/2015   Procedure: FECAL TRANSPLANT;  Surgeon: Manya Silvas, MD;  Location: [facility] ENDOSCOPY;  Service: Endoscopy;  Laterality: N/A;  . HAND SURGERY Bilateral   . HEMATOMA EVACUATION Left 11/25/2019   Procedure: EVACUATION HEMATOMA;  Surgeon: Katha Cabal, MD;  Location: [facility];  Service: Vascular;  Laterality: Left;  . I & D EXTREMITY Left 11/25/2019   Procedure: IRRIGATION AND DEBRIDEMENT EXTREMITY;  Surgeon: Katha Cabal, MD;  Location: [facility];  Service: Vascular;  Laterality: Left;  . IR FLUORO GUIDE CV LINE  RIGHT  04/06/2020  . IR RADIOLOGIST EVAL & MGMT  07/28/2019  . IR RADIOLOGIST EVAL & MGMT  08/11/2019  . LIGATION OF ARTERIOVENOUS  FISTULA Left 11/11/2019  . LIGATION OF ARTERIOVENOUS  FISTULA Left 11/11/2019   Procedure: LIGATION OF ARTERIOVENOUS  FISTULA;  Surgeon: Katha Cabal, MD;  Location: [facility];  Service: Vascular;  Laterality: Left;  . PERIPHERAL VASCULAR CATHETERIZATION N/A 12/20/2015   Procedure: Thrombectomy of dialysis access versus permcath placement;  Surgeon: Algernon Huxley, MD;  Location: [place] [facility];  Service: Cardiovascular;  Laterality: N/A;  . PERIPHERAL VASCULAR CATHETERIZATION N/A 12/20/2015   Procedure: A/V Shunt Intervention;  Surgeon: Algernon Huxley, MD;  Location: [place] [facility];  Service: Cardiovascular;  Laterality: N/A;  . PERIPHERAL VASCULAR CATHETERIZATION N/A 12/20/2015   Procedure: A/V Shuntogram/Fistulagram;  Surgeon: Algernon Huxley, MD;  Location:  [place] [facility];  Service: Cardiovascular;  Laterality: N/A;  . PERIPHERAL VASCULAR CATHETERIZATION N/A 01/02/2016   Procedure: A/V Shuntogram/Fistulagram;  Surgeon: Algernon Huxley, MD;  Location: [place] [facility];  Service: Cardiovascular;  Laterality: N/A;  . PERIPHERAL VASCULAR CATHETERIZATION N/A 01/02/2016   Procedure: A/V Shunt Intervention;  Surgeon: Algernon Huxley, MD;  Location: [place] [facility];  Service: Cardiovascular;  Laterality: N/A;  . UPPER EXTREMITY VENOGRAPHY Right 01/18/2020   Procedure: UPPER EXTREMITY VENOGRAPHY;  Surgeon: Katha Cabal, MD;  Location: [place] [facility];  Service: Cardiovascular;  Laterality: Right;  Marland Kitchen VASCULAR ACCESS DEVICE INSERTION Right 04/13/2020   Procedure: INSERTION OF HERO VASCULAR ACCESS DEVICE (GRAFT);  Surgeon: Katha Cabal, MD;  Location: [facility];  Service: Vascular;  Laterality: Right;   Patient Active Problem List   Diagnosis Date Noted  . Anemia 05/31/2020  . Type 1 diabetes mellitus ([place]) 05/31/2020  . End-stage renal disease ([place])  05/31/2020  . Hypertensive disorder 05/31/2020  . Arm pain 05/21/2020  . Cellulitis of right leg 05/02/2020  . ESRD (end stage renal disease) ([place]) 04/13/2020  . SOBOE (shortness of breath on exertion) 02/08/2020  . Hematoma of arm, left, subsequent encounter 11/24/2019  . Respiratory failure ([place])   . Shortness of breath   . Complication of arteriovenous dialysis fistula 11/13/2019  . PVD (peripheral vascular disease) ([place]) 11/13/2019  . Severe anemia 11/13/2019  . Pressure injury of skin 11/13/2019  . ESRD on hemodialysis ([place])   . Hemorrhagic shock ([name]) 11/11/2019  . Leg pain 10/02/2019  . CAP (community acquired pneumonia) 10/01/2019  . Atherosclerosis of native arteries of extremity with intermittent claudication ([place]) 05/01/2019  . Right-sided headache   . COPD (chronic obstructive pulmonary disease) ([place]) 10/06/2018  . Syncope 10/04/2018  . Symptomatic anemia 06/18/2018  . Gastroparesis due to DM ([place]) 01/18/2018  . Complication of vascular access for dialysis 12/04/2017  . Osteomyelitis ([place]) 09/30/2017  . Carotid stenosis 06/18/2017  . Bilateral carotid artery stenosis 06/16/2017  . Benign essential HTN 05/19/2017  . CKD (chronic kidney disease) stage 5, GFR less than 15 ml/min (HCC) 05/19/2017  . Hyperlipidemia, mixed 05/19/2017  . Shortness of breath 05/04/2017  . Rash, skin 12/16/2016  . Cellulitis of lower extremity 07/29/2016  . Chronic venous insufficiency 07/29/2016  . Lymphedema 07/29/2016  . TIA (transient ischemic attack) 04/21/2016  . Altered mental status 04/08/2016  . Hyperammonemia ([place]) 04/08/2016  . Elevated troponin 04/08/2016  . Depression 04/08/2016  . Depression, major, recurrent, severe with psychosis ([place]) 04/08/2016  . Blood in stool   . Intractable cyclical vomiting with nausea   . Reflux esophagitis   . Gastritis   . Generalized abdominal pain   . Uncontrollable vomiting   . Major depressive disorder, recurrent episode, moderate ([place])  03/15/2016  . Adjustment disorder with mixed anxiety and depressed mood 03/15/2016  . Malnutrition of moderate degree 12/01/2015  . Renal mass   . Dyspnea   . Acute renal failure ([place])   . Respiratory failure ([place])   . High temperature 11/14/2015  . Encounter for central line placement   . Encounter for orogastric (OG) tube placement   . Nausea 11/12/2015  . Hyperkalemia 10/03/2015  . Diarrhea, unspecified 07/22/2015  . Pneumonia 05/21/2015  . Hypoglycemia 04/24/2015  . Unresponsiveness 04/24/2015  . Bradycardia 04/24/2015  . Hypothermia 04/24/2015  . Acute respiratory failure ([place]) 04/24/2015  . Acute diastolic CHF (congestive heart failure) ([place]) 04/05/2015  . Diabetic gastroparesis ([place]) 04/05/2015  .  Hypokalemia 04/05/2015  . Generalized weakness 04/05/2015  . Acute pulmonary edema ([place]) 04/03/2015  . Nausea and vomiting 04/03/2015  . Hypoglycemia associated with diabetes ([place]) 04/03/2015  . Anemia of chronic disease 04/03/2015  . Secondary hyperparathyroidism ([place]) 04/03/2015  . Pressure ulcer 04/02/2015  . Acute respiratory failure with hypoxia ([place]) 04/01/2015  . Adjustment disorder with anxiety 03/14/2015  . Somatic symptom disorder, mild 03/08/2015  . Coronary artery disease involving native coronary artery of native heart without angina pectoris   . Nausea & vomiting 03/06/2015  . Abdominal pain 03/06/2015  . DM (diabetes mellitus) ([place]) 03/06/2015  . HTN (hypertension) 03/06/2015  . Gastroparesis 02/24/2015  . Pleural effusion 02/19/2015  . HCAP (healthcare-associated pneumonia) 02/19/2015  . End-stage renal disease on hemodialysis ([place]) 02/19/2015  . Diarrhea 08/01/2014      Prior to Admission medications   Medication Sig Start Date End Date Taking? Authorizing Provider  albuterol (PROVENTIL) (2.5 MG/3ML) 0.083% nebulizer solution Take 2.5 mg by nebulization every 4 (four) hours as needed for wheezing or shortness of breath.    [provider]   albuterol (VENTOLIN HFA) 108 (90 Base) MCG/ACT inhaler Inhale 2 puffs into the lungs every 4 (four) hours as needed for wheezing or shortness of breath. 11/09/19   [provider]  ammonium lactate (LAC-HYDRIN) 12 % lotion Apply 1 application topically 2 (two) times daily as needed for dry skin.     [provider]  Ascorbic Acid (VITA-C PO) Take 1 tablet by mouth daily.    [provider]  aspirin EC 81 MG tablet Take 81 mg by mouth daily.    [provider]  atorvastatin (LIPITOR) 20 MG tablet Take 20 mg by mouth every evening.  10/24/19   [provider]  calcium acetate (PHOSLO) 667 MG capsule Take 667-2,001 mg by mouth See admin instructions. Take 3 capsules (2001mg ) by mouth three times a day with meals and take 1 capsule (667mg ) by mouth daily with a snack    [provider]  carvedilol (COREG) 6.25 MG tablet Take 6.25 mg by mouth 2 (two) times daily.  10/24/19   [provider]  Cholecalciferol (VITAMIN D3) 25 MCG (1000 UT) CAPS Take 1,000 Units by mouth daily.     [provider]  cholestyramine light (PREVALITE) 4 g packet Take 4 g by mouth 2 (two) times daily.    [provider]  CREON 12000 units CPEP capsule Take 12,000-36,000 Units by mouth See admin instructions. Take 3 capsules (36,000u) by mouth three times daily with meals and take 1 capsule (12,000u) by mouth daily with a snack 10/21/19   [provider]  cyanocobalamin 1000 MCG tablet Take 1,000 mcg by mouth daily.     [provider]  diphenhydrAMINE (BENADRYL) 25 MG tablet Take 25 mg by mouth every 6 (six) hours as needed for itching.    [provider]  Ferrous Sulfate (IRON) 325 (65 Fe) MG TABS Take 325 mg by mouth daily.     [provider]  furosemide (LASIX) 80 MG tablet Take 80 mg by mouth daily. 05/08/20   [provider]  gabapentin (NEURONTIN) 100 MG capsule Take 100 mg by mouth every evening.      [provider]  HYDROcodone-acetaminophen (NORCO/VICODIN) 5-325 MG tablet Take 1 tablet by mouth 4 (four) times daily as needed for moderate pain or severe pain.     [provider]  hydrOXYzine (ATARAX/VISTARIL) 25 MG tablet Take 25 mg by mouth  daily.     [provider]  insulin aspart (NOVOLOG) 100 UNIT/ML injection Inject 4-15 Units into the skin in the morning, at noon, in the evening, and at bedtime. Sliding scale    [provider]  insulin detemir (LEVEMIR) 100 UNIT/ML injection Inject 67 Units into the skin at bedtime.    [provider]  meclizine (ANTIVERT) 25 MG tablet Take 25 mg by mouth every 6 (six) hours.  11/01/19   [provider]  multivitamin (RENA-VIT) TABS tablet Take 1 tablet by mouth daily.     [provider]  mupirocin ointment (BACTROBAN) 2 % Apply 1 application topically daily as needed (leg rash).  11/09/19   [provider]  nitroGLYCERIN (NITROSTAT) 0.4 MG SL tablet Place 0.4 mg under the tongue every 5 (five) minutes as needed for chest pain.     [provider]  Omega-3 300 MG CAPS Take 300 mg by mouth 2 (two) times daily. Every other day opposite omega XL    [provider]  pantoprazole (PROTONIX) 40 MG tablet Take 40 mg by mouth daily. 08/28/19   [provider]  Probiotic Product (PROBIOTIC PO) Take 1 capsule by mouth daily.    [provider]  sevelamer carbonate (RENVELA) 800 MG tablet Take 800 mg by mouth 4 (four) times daily. (at meals and with a snack)    [provider]  traMADol (ULTRAM) 50 MG tablet Take 1 tablet (50 mg total) by mouth every 8 (eight) hours as needed for moderate pain or severe pain. 05/21/20   Schnier, Dolores Lory, MD    Allergies Ace inhibitors, Ativan [lorazepam], Compazine [prochlorperazine edisylate], Sumatriptan succinate, Zofran [ondansetron], Lac bovis, Losartan, Prochlorperazine, Reglan [metoclopramide], Scopolamine,  Tape, and Tapentadol    Social History Social History   Tobacco Use  . Smoking status: Never Smoker  . Smokeless tobacco: Never Used  Vaping Use  . Vaping Use: Never used  Substance Use Topics  . Alcohol use: Not Currently    Comment: glass wine week per pt  . Drug use: Yes    Types: Marijuana    Comment: once a day    Review of Systems Patient denies headaches, rhinorrhea, blurry vision, numbness, shortness of breath, chest pain, edema, cough, abdominal pain, nausea, vomiting, diarrhea, dysuria, fevers, rashes or hallucinations unless otherwise stated above in HPI. ____________________________________________   PHYSICAL EXAM:  VITAL SIGNS: Vitals:   06/02/20 1414 06/02/20 1833  BP: (!) 182/73 (!) 170/68  Pulse: 66 63  Resp: 12 20  Temp:  97.7 F (36.5 C)  SpO2: 96% 97%    Constitutional: Alert and oriented. Ill and frail appearing Eyes: Conjunctivae are normal.  Head: Atraumatic. Nose: No congestion/rhinnorhea. Mouth/Throat: Mucous membranes are moist.   Neck: No stridor. Painless ROM.  Cardiovascular: Normal rate, regular rhythm. Grossly normal heart sounds.  Good peripheral circulation. Respiratory: mild tachypnea.  No retractions. Lungs with bibasilar crackles Gastrointestinal: Soft and nontender. No distention. No abdominal bruits. No CVA tenderness. Genitourinary:  Musculoskeletal: No lower extremity tenderness, BLE edema.  No joint effusions. Neurologic:  Normal speech and language. No gross focal neurologic deficits are appreciated. No facial droop Skin:  Skin is warm, dry and intact. No rash noted. Psychiatric: Mood and affect are normal. Speech and behavior are normal.  ____________________________________________   LABS (all labs ordered are listed, but only abnormal results are displayed)  Results for orders placed or performed during the hospital encounter of 06/02/20 (from the past 24 hour(s))  CBC  with Differential     Status: Abnormal    Collection Time: 06/02/20  7:07 PM  Result Value Ref Range   WBC 5.4 4.0 - 10.5 K/uL   RBC 2.99 (L) 3.87 - 5.11 MIL/uL   Hemoglobin 8.9 (L) 12.0 - 15.0 g/dL   HCT 26.5 (L) 36 - 46 %   MCV 88.6 80.0 - 100.0 fL   MCH 29.8 26.0 - 34.0 pg   MCHC 33.6 30.0 - 36.0 g/dL   RDW 15.9 (H) 11.5 - 15.5 %   Platelets 130 (L) 150 - 400 K/uL   nRBC 0.0 0.0 - 0.2 %   Neutrophils Relative % 37 %   Neutro Abs 2.0 1.7 - 7.7 K/uL   Lymphocytes Relative 32 %   Lymphs Abs 1.7 0.7 - 4.0 K/uL   Monocytes Relative 6 %   Monocytes Absolute 0.3 0 - 1 K/uL   Eosinophils Relative 24 %   Eosinophils Absolute 1.3 (H) 0 - 0 K/uL   Basophils Relative 1 %   Basophils Absolute 0.1 0 - 0 K/uL   Immature Granulocytes 0 %   Abs Immature Granulocytes 0.02 0.00 - 0.07 K/uL  Basic metabolic panel     Status: Abnormal   Collection Time: 06/02/20  7:07 PM  Result Value Ref Range   Sodium 137 135 - 145 mmol/L   Potassium 4.2 3.5 - 5.1 mmol/L   Chloride 98 98 - 111 mmol/L   CO2 28 22 - 32 mmol/L   Glucose, Bld 132 (H) 70 - 99 mg/dL   BUN 25 (H) 8 - 23 mg/dL   Creatinine, Ser 6.68 (H) 0.44 - 1.00 mg/dL   Calcium 8.7 (L) 8.9 - 10.3 mg/dL   GFR calc non Af Amer 6 (L) >60 mL/min   GFR calc Af Amer 7 (L) >60 mL/min   Anion gap 11 5 - 15   ____________________________________________  EKG My review and personal interpretation at Time: 11:25   Indication: sob  Rate: 65  Rhythm: sinus Axis: normal Other: normal intervals, no hyperacute twaves ____________________________________________  RADIOLOGY  I personally reviewed all radiographic images ordered to evaluate for the above acute complaints and reviewed radiology reports and findings.  These findings were personally discussed with the patient.  Please see medical record for radiology report.  ____________________________________________   PROCEDURES  Procedure(s) performed:  Procedures    Critical Care performed:  no ____________________________________________   INITIAL IMPRESSION / ASSESSMENT AND PLAN / ED COURSE  Pertinent labs & imaging results that were available during my care of the patient were reviewed by me and considered in my medical decision making (see chart for details).   DDX: covid19, Asthma, copd, CHF, pna, ptx, malignancy, Pe, anemia   ZAMARIYA NEAL is a 59 y.o. who presents to the ED with symptoms as described above.  Patient not hypoxic but is mildly tachypnic and has exam findings consistent with volume overload having missed dialysis yesterday.  No signs of hyperacute T waves or hyperkalemia on EKG.  She is having some diarrheal episodes but it looks like she is had issues with diarrhea in the past.  No abdominal pain on exam.  Clinical Course as of Jun 02 2002  Sat Jun 02, 2020  1901 Is able to obtain IV access through left EJ via ultrasound guidance.  Chest x-ray shows that she does have some volume overload.  Blood work shows normal potassium.  Given her respiratory symptoms I do feel she is going require hospitalization for  dialysis.  Discussed case in consultation with Dr.Lateef.  Will consult hospitalist for admission.   [PR]    Clinical Course User Index [PR] Merlyn Lot, MD    The patient was evaluated in Emergency Department today for the symptoms described in the history of present illness. He/she was evaluated in the context of the global COVID-19 pandemic, which necessitated consideration that the patient might be at risk for infection with the SARS-CoV-2 virus that causes COVID-19. Institutional protocols and algorithms that pertain to the evaluation of patients at risk for COVID-19 are in a state of rapid change based on information released by regulatory bodies including the CDC and federal and state organizations. These policies and algorithms were followed during the patient's care in the ED.  As part of my medical decision making, I reviewed the following  data within the [place] notes reviewed and incorporated, Labs reviewed, notes from prior ED visits and [place] Controlled Substance Database   ____________________________________________   FINAL CLINICAL IMPRESSION(S) / ED DIAGNOSES  Final diagnoses:  Dyspnea, unspecified type  Acute on chronic congestive heart failure, unspecified heart failure type ([place])  ESRD on dialysis ([place])      NEW MEDICATIONS STARTED DURING THIS VISIT:  New Prescriptions   No medications on file     Note:  This document was prepared using Dragon voice recognition software and may include unintentional dictation errors.    Merlyn Lot, MD 06/02/20 2003

## 2020-06-03 DIAGNOSIS — J449 Chronic obstructive pulmonary disease, unspecified: Secondary | ICD-10-CM | POA: Diagnosis not present

## 2020-06-03 DIAGNOSIS — Y832 Surgical operation with anastomosis, bypass or graft as the cause of abnormal reaction of the patient, or of later complication, without mention of misadventure at the time of the procedure: Secondary | ICD-10-CM | POA: Diagnosis present

## 2020-06-03 DIAGNOSIS — I509 Heart failure, unspecified: Secondary | ICD-10-CM | POA: Diagnosis not present

## 2020-06-03 DIAGNOSIS — A419 Sepsis, unspecified organism: Secondary | ICD-10-CM | POA: Diagnosis not present

## 2020-06-03 DIAGNOSIS — M7989 Other specified soft tissue disorders: Secondary | ICD-10-CM | POA: Diagnosis not present

## 2020-06-03 DIAGNOSIS — J90 Pleural effusion, not elsewhere classified: Secondary | ICD-10-CM | POA: Diagnosis not present

## 2020-06-03 DIAGNOSIS — Z992 Dependence on renal dialysis: Secondary | ICD-10-CM | POA: Diagnosis not present

## 2020-06-03 DIAGNOSIS — T82590D Other mechanical complication of surgically created arteriovenous fistula, subsequent encounter: Secondary | ICD-10-CM | POA: Diagnosis not present

## 2020-06-03 DIAGNOSIS — E1136 Type 2 diabetes mellitus with diabetic cataract: Secondary | ICD-10-CM | POA: Diagnosis present

## 2020-06-03 DIAGNOSIS — E1122 Type 2 diabetes mellitus with diabetic chronic kidney disease: Secondary | ICD-10-CM | POA: Diagnosis not present

## 2020-06-03 DIAGNOSIS — J8489 Other specified interstitial pulmonary diseases: Secondary | ICD-10-CM | POA: Diagnosis not present

## 2020-06-03 DIAGNOSIS — I5033 Acute on chronic diastolic (congestive) heart failure: Secondary | ICD-10-CM | POA: Diagnosis not present

## 2020-06-03 DIAGNOSIS — I5031 Acute diastolic (congestive) heart failure: Secondary | ICD-10-CM | POA: Diagnosis not present

## 2020-06-03 DIAGNOSIS — R0602 Shortness of breath: Secondary | ICD-10-CM | POA: Diagnosis not present

## 2020-06-03 DIAGNOSIS — I5030 Unspecified diastolic (congestive) heart failure: Secondary | ICD-10-CM | POA: Diagnosis not present

## 2020-06-03 DIAGNOSIS — R4182 Altered mental status, unspecified: Secondary | ICD-10-CM | POA: Diagnosis not present

## 2020-06-03 DIAGNOSIS — R600 Localized edema: Secondary | ICD-10-CM | POA: Diagnosis not present

## 2020-06-03 DIAGNOSIS — D62 Acute posthemorrhagic anemia: Secondary | ICD-10-CM | POA: Diagnosis not present

## 2020-06-03 DIAGNOSIS — R0781 Pleurodynia: Secondary | ICD-10-CM | POA: Diagnosis not present

## 2020-06-03 DIAGNOSIS — E0840 Diabetes mellitus due to underlying condition with diabetic neuropathy, unspecified: Secondary | ICD-10-CM | POA: Diagnosis not present

## 2020-06-03 DIAGNOSIS — J9601 Acute respiratory failure with hypoxia: Secondary | ICD-10-CM | POA: Diagnosis present

## 2020-06-03 DIAGNOSIS — R071 Chest pain on breathing: Secondary | ICD-10-CM | POA: Diagnosis not present

## 2020-06-03 DIAGNOSIS — I132 Hypertensive heart and chronic kidney disease with heart failure and with stage 5 chronic kidney disease, or end stage renal disease: Secondary | ICD-10-CM | POA: Diagnosis not present

## 2020-06-03 DIAGNOSIS — I2699 Other pulmonary embolism without acute cor pulmonale: Secondary | ICD-10-CM | POA: Diagnosis not present

## 2020-06-03 DIAGNOSIS — I161 Hypertensive emergency: Secondary | ICD-10-CM | POA: Diagnosis present

## 2020-06-03 DIAGNOSIS — K3184 Gastroparesis: Secondary | ICD-10-CM | POA: Diagnosis present

## 2020-06-03 DIAGNOSIS — E876 Hypokalemia: Secondary | ICD-10-CM | POA: Diagnosis not present

## 2020-06-03 DIAGNOSIS — J9811 Atelectasis: Secondary | ICD-10-CM | POA: Diagnosis not present

## 2020-06-03 DIAGNOSIS — R6520 Severe sepsis without septic shock: Secondary | ICD-10-CM | POA: Diagnosis not present

## 2020-06-03 DIAGNOSIS — J189 Pneumonia, unspecified organism: Secondary | ICD-10-CM | POA: Diagnosis present

## 2020-06-03 DIAGNOSIS — E1151 Type 2 diabetes mellitus with diabetic peripheral angiopathy without gangrene: Secondary | ICD-10-CM | POA: Diagnosis present

## 2020-06-03 DIAGNOSIS — I10 Essential (primary) hypertension: Secondary | ICD-10-CM | POA: Diagnosis not present

## 2020-06-03 DIAGNOSIS — E869 Volume depletion, unspecified: Secondary | ICD-10-CM | POA: Diagnosis not present

## 2020-06-03 DIAGNOSIS — B952 Enterococcus as the cause of diseases classified elsewhere: Secondary | ICD-10-CM | POA: Diagnosis not present

## 2020-06-03 DIAGNOSIS — E1142 Type 2 diabetes mellitus with diabetic polyneuropathy: Secondary | ICD-10-CM | POA: Diagnosis not present

## 2020-06-03 DIAGNOSIS — G929 Unspecified toxic encephalopathy: Secondary | ICD-10-CM | POA: Diagnosis not present

## 2020-06-03 DIAGNOSIS — D631 Anemia in chronic kidney disease: Secondary | ICD-10-CM | POA: Diagnosis not present

## 2020-06-03 DIAGNOSIS — N2581 Secondary hyperparathyroidism of renal origin: Secondary | ICD-10-CM | POA: Diagnosis not present

## 2020-06-03 DIAGNOSIS — I2510 Atherosclerotic heart disease of native coronary artery without angina pectoris: Secondary | ICD-10-CM | POA: Diagnosis present

## 2020-06-03 DIAGNOSIS — I700 Atherosclerosis of aorta: Secondary | ICD-10-CM | POA: Diagnosis not present

## 2020-06-03 DIAGNOSIS — I4891 Unspecified atrial fibrillation: Secondary | ICD-10-CM | POA: Diagnosis not present

## 2020-06-03 DIAGNOSIS — E669 Obesity, unspecified: Secondary | ICD-10-CM | POA: Diagnosis not present

## 2020-06-03 DIAGNOSIS — T827XXA Infection and inflammatory reaction due to other cardiac and vascular devices, implants and grafts, initial encounter: Secondary | ICD-10-CM | POA: Diagnosis not present

## 2020-06-03 DIAGNOSIS — M2550 Pain in unspecified joint: Secondary | ICD-10-CM | POA: Diagnosis not present

## 2020-06-03 DIAGNOSIS — R1311 Dysphagia, oral phase: Secondary | ICD-10-CM | POA: Diagnosis not present

## 2020-06-03 DIAGNOSIS — K861 Other chronic pancreatitis: Secondary | ICD-10-CM | POA: Diagnosis not present

## 2020-06-03 DIAGNOSIS — I639 Cerebral infarction, unspecified: Secondary | ICD-10-CM | POA: Diagnosis not present

## 2020-06-03 DIAGNOSIS — M6281 Muscle weakness (generalized): Secondary | ICD-10-CM | POA: Diagnosis not present

## 2020-06-03 DIAGNOSIS — J811 Chronic pulmonary edema: Secondary | ICD-10-CM | POA: Diagnosis not present

## 2020-06-03 DIAGNOSIS — G894 Chronic pain syndrome: Secondary | ICD-10-CM | POA: Diagnosis not present

## 2020-06-03 DIAGNOSIS — E8770 Fluid overload, unspecified: Secondary | ICD-10-CM | POA: Diagnosis not present

## 2020-06-03 DIAGNOSIS — N186 End stage renal disease: Secondary | ICD-10-CM | POA: Diagnosis not present

## 2020-06-03 DIAGNOSIS — R7881 Bacteremia: Secondary | ICD-10-CM | POA: Diagnosis not present

## 2020-06-03 DIAGNOSIS — T82898A Other specified complication of vascular prosthetic devices, implants and grafts, initial encounter: Secondary | ICD-10-CM | POA: Diagnosis not present

## 2020-06-03 DIAGNOSIS — J440 Chronic obstructive pulmonary disease with acute lower respiratory infection: Secondary | ICD-10-CM | POA: Diagnosis present

## 2020-06-03 DIAGNOSIS — I959 Hypotension, unspecified: Secondary | ICD-10-CM | POA: Diagnosis not present

## 2020-06-03 DIAGNOSIS — I871 Compression of vein: Secondary | ICD-10-CM | POA: Diagnosis not present

## 2020-06-03 DIAGNOSIS — D638 Anemia in other chronic diseases classified elsewhere: Secondary | ICD-10-CM | POA: Diagnosis not present

## 2020-06-03 DIAGNOSIS — Z90710 Acquired absence of both cervix and uterus: Secondary | ICD-10-CM | POA: Diagnosis not present

## 2020-06-03 DIAGNOSIS — Z20822 Contact with and (suspected) exposure to covid-19: Secondary | ICD-10-CM | POA: Diagnosis not present

## 2020-06-03 DIAGNOSIS — M1990 Unspecified osteoarthritis, unspecified site: Secondary | ICD-10-CM | POA: Diagnosis present

## 2020-06-03 DIAGNOSIS — K5730 Diverticulosis of large intestine without perforation or abscess without bleeding: Secondary | ICD-10-CM | POA: Diagnosis not present

## 2020-06-03 DIAGNOSIS — I517 Cardiomegaly: Secondary | ICD-10-CM | POA: Diagnosis not present

## 2020-06-03 DIAGNOSIS — E1143 Type 2 diabetes mellitus with diabetic autonomic (poly)neuropathy: Secondary | ICD-10-CM | POA: Diagnosis present

## 2020-06-03 DIAGNOSIS — A4181 Sepsis due to Enterococcus: Secondary | ICD-10-CM | POA: Diagnosis present

## 2020-06-03 DIAGNOSIS — A439 Nocardiosis, unspecified: Secondary | ICD-10-CM | POA: Diagnosis not present

## 2020-06-03 DIAGNOSIS — D696 Thrombocytopenia, unspecified: Secondary | ICD-10-CM

## 2020-06-03 DIAGNOSIS — R29818 Other symptoms and signs involving the nervous system: Secondary | ICD-10-CM | POA: Diagnosis not present

## 2020-06-03 DIAGNOSIS — I120 Hypertensive chronic kidney disease with stage 5 chronic kidney disease or end stage renal disease: Secondary | ICD-10-CM | POA: Diagnosis not present

## 2020-06-03 DIAGNOSIS — J441 Chronic obstructive pulmonary disease with (acute) exacerbation: Secondary | ICD-10-CM | POA: Diagnosis not present

## 2020-06-03 DIAGNOSIS — J9602 Acute respiratory failure with hypercapnia: Secondary | ICD-10-CM | POA: Diagnosis not present

## 2020-06-03 DIAGNOSIS — R079 Chest pain, unspecified: Secondary | ICD-10-CM | POA: Diagnosis not present

## 2020-06-03 DIAGNOSIS — J9690 Respiratory failure, unspecified, unspecified whether with hypoxia or hypercapnia: Secondary | ICD-10-CM | POA: Diagnosis not present

## 2020-06-03 DIAGNOSIS — Z7401 Bed confinement status: Secondary | ICD-10-CM | POA: Diagnosis not present

## 2020-06-03 DIAGNOSIS — T80212A Local infection due to central venous catheter, initial encounter: Secondary | ICD-10-CM | POA: Diagnosis not present

## 2020-06-03 LAB — CREATININE, SERUM
Creatinine, Ser: 4.56 mg/dL — ABNORMAL HIGH (ref 0.44–1.00)
GFR calc Af Amer: 11 mL/min — ABNORMAL LOW (ref >60)
GFR calc non Af Amer: 10 mL/min — ABNORMAL LOW (ref >60)

## 2020-06-03 LAB — CBC
HCT: 24.9 % — ABNORMAL LOW (ref 36.0–46.0)
Hemoglobin: 8 g/dL — ABNORMAL LOW (ref 12.0–15.0)
MCH: 29 pg (ref 26.0–34.0)
MCHC: 32.1 g/dL (ref 30.0–36.0)
MCV: 90.2 fL (ref 80.0–100.0)
Platelets: 118 10*3/uL — ABNORMAL LOW (ref 150–400)
RBC: 2.76 MIL/uL — ABNORMAL LOW (ref 3.87–5.11)
RDW: 15.9 % — ABNORMAL HIGH (ref 11.5–15.5)
WBC: 8.1 10*3/uL (ref 4.0–10.5)
nRBC: 0 % (ref 0.0–0.2)

## 2020-06-03 LAB — SARS CORONAVIRUS 2 BY RT PCR (HOSPITAL ORDER, PERFORMED IN ~~LOC~~ HOSPITAL LAB): SARS Coronavirus 2: NEGATIVE

## 2020-06-03 LAB — GLUCOSE, CAPILLARY
Glucose-Capillary: 157 mg/dL — ABNORMAL HIGH (ref 70–99)
Glucose-Capillary: 36 mg/dL — CL (ref 70–99)

## 2020-06-03 LAB — PROCALCITONIN: Procalcitonin: 20.16 ng/mL

## 2020-06-03 MED ORDER — SEVELAMER CARBONATE 800 MG PO TABS
800.0000 mg | ORAL_TABLET | Freq: Three times a day (TID) | ORAL | Status: DC
  Administered 2020-06-03 – 2020-06-10 (×16): 800 mg via ORAL
  Filled 2020-06-03 (×19): qty 1

## 2020-06-03 MED ORDER — SODIUM CHLORIDE 0.9 % IV SOLN
500.0000 mg | INTRAVENOUS | Status: DC
  Administered 2020-06-03 – 2020-06-05 (×3): 500 mg via INTRAVENOUS
  Filled 2020-06-03 (×4): qty 500

## 2020-06-03 MED ORDER — DEXTROSE 50 % IV SOLN
INTRAVENOUS | Status: AC
  Administered 2020-06-03: 1 via INTRAVENOUS
  Filled 2020-06-03: qty 50

## 2020-06-03 MED ORDER — PROMETHAZINE HCL 25 MG/ML IJ SOLN
12.5000 mg | INTRAMUSCULAR | Status: AC
  Administered 2020-06-03: 12.5 mg via INTRAVENOUS
  Filled 2020-06-03: qty 1

## 2020-06-03 MED ORDER — MORPHINE SULFATE (PF) 2 MG/ML IV SOLN: 2.0000 mg | Freq: Once | INTRAVENOUS | Status: DC

## 2020-06-03 MED ORDER — FUROSEMIDE 20 MG PO TABS
80.0000 mg | ORAL_TABLET | Freq: Every day | ORAL | Status: DC
  Administered 2020-06-03 – 2020-06-08 (×6): 80 mg via ORAL
  Filled 2020-06-03 (×7): qty 2

## 2020-06-03 MED ORDER — PANCRELIPASE (LIP-PROT-AMYL) 12000-38000 UNITS PO CPEP
12000.0000 [IU] | ORAL_CAPSULE | Freq: Three times a day (TID) | ORAL | Status: DC
  Administered 2020-06-03 – 2020-06-25 (×41): 12000 [IU] via ORAL
  Filled 2020-06-03 (×56): qty 1

## 2020-06-03 MED ORDER — CARVEDILOL 6.25 MG PO TABS
6.2500 mg | ORAL_TABLET | Freq: Two times a day (BID) | ORAL | Status: DC
  Administered 2020-06-03 – 2020-06-11 (×12): 6.25 mg via ORAL
  Filled 2020-06-03 (×15): qty 1

## 2020-06-03 MED ORDER — CHLORHEXIDINE GLUCONATE CLOTH 2 % EX PADS
6.0000 | MEDICATED_PAD | Freq: Every day | CUTANEOUS | Status: DC
  Administered 2020-06-07 – 2020-06-25 (×16): 6 via TOPICAL

## 2020-06-03 MED ORDER — ATORVASTATIN CALCIUM 20 MG PO TABS
20.0000 mg | ORAL_TABLET | Freq: Every day | ORAL | Status: DC
  Administered 2020-06-03 – 2020-06-24 (×19): 20 mg via ORAL
  Filled 2020-06-03 (×20): qty 1

## 2020-06-03 MED ORDER — DEXTROSE 10 % IV SOLN: INTRAVENOUS | Status: DC

## 2020-06-03 MED ORDER — MORPHINE SULFATE (PF) 2 MG/ML IV SOLN
2.0000 mg | Freq: Once | INTRAVENOUS | Status: AC
  Administered 2020-06-03: 2 mg via INTRAVENOUS
  Filled 2020-06-03: qty 1

## 2020-06-03 MED ORDER — METHOCARBAMOL 500 MG PO TABS
500.0000 mg | ORAL_TABLET | Freq: Once | ORAL | Status: AC
  Administered 2020-06-03: 500 mg via ORAL
  Filled 2020-06-03: qty 1

## 2020-06-03 MED ORDER — DEXTROSE 50 % IV SOLN: 1.0000 | Freq: Once | INTRAVENOUS | Status: AC

## 2020-06-03 MED ORDER — EPOETIN ALFA 10000 UNIT/ML IJ SOLN
10000.0000 [IU] | INTRAMUSCULAR | Status: DC
  Administered 2020-06-04 – 2020-06-10 (×3): 10000 [IU] via INTRAVENOUS

## 2020-06-03 MED ORDER — MORPHINE SULFATE (PF) 4 MG/ML IV SOLN
4.0000 mg | Freq: Once | INTRAVENOUS | Status: DC
  Filled 2020-06-03: qty 1

## 2020-06-03 MED ORDER — SODIUM CHLORIDE 0.9 % IV SOLN
1.0000 g | INTRAVENOUS | Status: DC
  Administered 2020-06-03 – 2020-06-05 (×3): 1 g via INTRAVENOUS
  Filled 2020-06-03 (×4): qty 10

## 2020-06-03 NOTE — Progress Notes (Signed)
[facility]  ROUNDING NOTE   Subjective:  Patient well-known to [place]. Presented with missed dialysis on Friday. Came in with shortness of breath and cough. Elevated blood pressure noted with systolic blood pressure greater than 200 upon admission. She underwent urgent dialysis yesterday.   Objective:  Vital signs in last 24 hours:  Temp:  [97.7 F (36.5 C)-100.5 F (38.1 C)] 100.5 F (38.1 C) (09/12 0431) Pulse Rate:  [63-109] 79 (09/12 1115) Resp:  [12-31] 20 (09/12 1115) BP: (146-213)/(61-108) 187/76 (09/12 1115) SpO2:  [89 %-100 %] 100 % (09/12 1115)  Weight change:  Filed Weights   06/02/20 1056  Weight: 80.7 kg    Intake/Output: I/O last 3 completed shifts: In: -  Out: 881 [Other:881]   Intake/Output this shift:  No intake/output data recorded.  Physical Exam: General:  No acute distress  Head:  Normocephalic, atraumatic. Moist oral mucosal membranes  Eyes:  Anicteric  Neck:  Supple  Lungs:   Basilar rales  Heart:  S1S2 no rubs  Abdomen:   Soft, nontender, bowel sounds present  Extremities:  2+ peripheral edema.  Neurologic:  Awake, alert, following commands  Skin:  No lesions  Access:  Right upper extremity AV access    Basic Metabolic Panel: Recent Labs  Lab 06/02/20 1907 06/03/20 0744  NA 137  --   K 4.2  --   CL 98  --   CO2 28  --   GLUCOSE 132*  --   BUN 25*  --   CREATININE 6.68* 4.56*  CALCIUM 8.7*  --     Liver Function Tests: No results for input(s): AST, ALT, ALKPHOS, BILITOT, PROT, ALBUMIN in the last 168 hours. No results for input(s): LIPASE, AMYLASE in the last 168 hours. No results for input(s): AMMONIA in the last 168 hours.  CBC: Recent Labs  Lab 06/02/20 1907 06/03/20 0744  WBC 5.4 8.1  NEUTROABS 2.0  --   HGB 8.9* 8.0*  HCT 26.5* 24.9*  MCV 88.6 90.2  PLT 130* 118*    Cardiac Enzymes: No results for input(s): CKTOTAL, CKMB, CKMBINDEX, TROPONINI in the last 168 hours.  BNP: Invalid input(s):  POCBNP  CBG: No results for input(s): GLUCAP in the last 168 hours.  Microbiology: Results for orders placed or performed during the hospital encounter of 06/02/20  SARS Coronavirus 2 by RT PCR (hospital order, performed in [facility] hospital lab) Nasopharyngeal Nasopharyngeal Swab     Status: None   Collection Time: 06/03/20  6:49 AM   Specimen: Nasopharyngeal Swab  Result Value Ref Range Status   SARS Coronavirus 2 NEGATIVE NEGATIVE Final    Comment: (NOTE) SARS-CoV-2 target nucleic acids are NOT DETECTED.  The SARS-CoV-2 RNA is generally detectable in upper and lower respiratory specimens during the acute phase of infection. The lowest concentration of SARS-CoV-2 viral copies this assay can detect is 250 copies / mL. A negative result does not preclude SARS-CoV-2 infection and should not be used as the sole basis for treatment or other patient management decisions.  A negative result may occur with improper specimen collection / handling, submission of specimen other than nasopharyngeal swab, presence of viral mutation(s) within the areas targeted by this assay, and inadequate number of viral copies (<250 copies / mL). A negative result must be combined with clinical observations, patient history, and epidemiological information.  Fact Sheet for Patients:   StrictlyIdeas.no  Fact Sheet for Healthcare Providers: BankingDealers.co.za  This test is not yet approved or  cleared by  the [employer] and has been authorized for detection and/or diagnosis of SARS-CoV-2 by FDA under an Emergency Use Authorization (EUA).  This EUA will remain in effect (meaning this test can be used) for the duration of the COVID-19 declaration under Section 564(b)(1) of the Act, 21 U.S.C. section 360bbb-3(b)(1), unless the authorization is terminated or revoked sooner.  Performed at [facility], [address] [place], [place]  [address]     Coagulation Studies: No results for input(s): LABPROT, INR in the last 72 hours.  Urinalysis: No results for input(s): COLORURINE, LABSPEC, PHURINE, GLUCOSEU, HGBUR, BILIRUBINUR, KETONESUR, PROTEINUR, UROBILINOGEN, NITRITE, LEUKOCYTESUR in the last 72 hours.  Invalid input(s): APPERANCEUR    Imaging: DG Chest Portable 1 View  Result Date: 06/02/2020 CLINICAL DATA:  Shortness of breath EXAM: PORTABLE CHEST 1 VIEW COMPARISON:  CT 05/22/2020, radiograph 05/22/2020 FINDINGS: Bilateral pleural thickening and small basilar effusions. Mixed interstitial and patchy opacities towards the mid to lower lungs likely reflecting a combination of interstitial and developing alveolar edema with central cuffing, vascular cephalization and congestion. Cardiomegaly is similar to priors. Evidence of prior bilateral vascular stenting in the left brachiocephalic/subclavian vein and right upper arm. A right upper extremity HeRO graft is also noted, terminating at the level the right atrium. Removal of a right lower extremity central venous catheter. Telemetry leads overlie the chest. Mild body wall edema. No other acute or suspicious soft tissue or osseous abnormality. Degenerative changes are present in the imaged spine and shoulders. IMPRESSION: Features of CHF/volume overload with cardiomegaly, edema and small bilateral effusions. More patchy opacities could reflect alveolar edema versus underlying infection. Electronically Signed   By: Lovena Le M.D.   On: 06/02/2020 18:37     Medications:   . azithromycin    . cefTRIAXone (ROCEPHIN)  IV     . atorvastatin  20 mg Oral Daily  . carvedilol  6.25 mg Oral BID WC  . furosemide  80 mg Oral Daily  . heparin  5,000 Units Subcutaneous Q8H  . lipase/protease/amylase  12,000 Units Oral TID WC  .  morphine injection  4 mg Intravenous Once   acetaminophen **OR** acetaminophen, calcium carbonate, camphor-menthol **AND** hydrOXYzine, feeding supplement  (NEPRO CARB STEADY), hydrALAZINE, ondansetron **OR** ondansetron (ZOFRAN) IV, promethazine, sorbitol, zolpidem  Assessment/ Plan:  59 y.o. female with past medical history of ESRD on HD MWF, hypertension, COPD, diastolic heart failure, depression, diabetes mellitus type 2, hyperlipidemia, anemia of chronic kidney disease, secondary hyperparathyroidism, new dialysis access placement 04/12/2020 who presented with missed dialysis treatment and volume overload.  [facility]/MWF3/[place]/81kg.  1.  ESRD on HD MWF/volume overload/pulmonary edema.  Patient is dialysis treatment on Friday.  She underwent urgent dialysis yesterday for volume overload.  Tolerated treatment well.  We will plan for another dialysis session tomorrow.  2.  Anemia of chronic kidney disease.  Hemoglobin 8.0.  Restart the patient on Epogen tomorrow.  3.  Secondary hyperparathyroidism.  Recheck serum phosphorus tomorrow.  We will restart Renvela 3 tablets p.o. 3 times daily with meals which she was on as an outpatient.   LOS: 0 Gisel Vipond 9/12/20211:54 PM

## 2020-06-03 NOTE — ED Notes (Signed)
Lab contacted to collect blood cultures at this time

## 2020-06-03 NOTE — ED Notes (Addendum)
Pt placed on 2L O2, satting in the 80s

## 2020-06-03 NOTE — ED Notes (Signed)
Assumed care of pt, pt resting in bed with eyes closed, awakens to RN entering room. Denies needs or complaints at this time. AOx4. Lethargic. Talking in full sentences with regular and unlabored breathing.

## 2020-06-03 NOTE — ED Notes (Signed)
BG checked and results were 36 mg/dL, administered D50 IV push and infusion started for D10 per provider orders. Pt maintained mentation with RN throughout BG check and medication administration. Warm blankets provided for pt. BG checked at 2017 and was 157mg /dL.

## 2020-06-03 NOTE — Progress Notes (Signed)
PROGRESS NOTE    [name]  HAL:[id number] DOB: [birth year] DOA: 06/02/2020 PCP: [facility] [place]   Assessment & Plan:   Principal Problem:   ESRD on hemodialysis [facility]) Active Problems:   Anemia of chronic disease   Acute diastolic CHF (congestive heart failure) ([place])   COPD (chronic obstructive pulmonary disease) ([place])   Benign essential HTN  ESRD: w/ fluid overload. Secondary to missed HD. Will need HD. Nephro consulted  Possible pneumonia: w/ fevers but normal WBC. Will check pro-cal. Blood cxs ordered but pt refused. Will start IV azithromycin, ceftriaxone. Legionella, strep & mycoplasma ordered. COVID19 neg   ACD: secondary to ESRD. No need for a transfusion at this time. Will continue to monitor   Thrombocytopenia: etiology unclear. Will continue to monitor   COPD: w/o exacerbation. Continue on bronchodilators and encourage incentive spirometry   HTN emergency: likely secondary to missed HD. Will need HD. Will continue on home dose of coreg, lasix. IV hydralazine prn  Acute on chronic diastolic CHF: likely secondary to missed HD. Will need HD. Continue on dose of lasix   HLD: continue on statin   DVT prophylaxis: heparin  Code Status: full  Family Communication:  Disposition Plan: depends on PT/OT recs    Consultants:   nephro    Procedures:    Antimicrobials:    Subjective: Pt is lethargic. Pt is intermittently answering questions appropriately   Objective: Vitals:   06/03/20 0545 06/03/20 0600 06/03/20 0615 06/03/20 0630  BP: (!) 200/85 (!) 198/78 (!) 189/74 (!) 189/73  Pulse: 95 87 80 78  Resp:      Temp:      TempSrc:      SpO2: 98% 100% 99% 100%  Weight:      Height:        Intake/Output Summary (Last 24 hours) at 06/03/2020 0803 Last data filed at 06/03/2020 0105 Gross per 24 hour  Intake --  Output 881 ml  Net -881 ml   Filed Weights   06/02/20 1056  Weight: 80.7 kg    Examination:  General exam:  Appears lethargic. Appears older than stated age  Respiratory system: diminished breath sounds b/l Cardiovascular system: S1 & S2 +. No rubs, gallops or clicks. Gastrointestinal system: Abdomen is nondistended, soft and nontender. Normal bowel sounds heard. Central nervous system: Lethargic. Moves all 4 extremities Psychiatry: Judgement and insight appear abnormal. Flat mood and affect.     Data Reviewed: I have personally reviewed following labs and imaging studies  CBC: Recent Labs  Lab 06/02/20 1907  WBC 5.4  NEUTROABS 2.0  HGB 8.9*  HCT 26.5*  MCV 88.6  PLT 735*   Basic Metabolic Panel: Recent Labs  Lab 06/02/20 1907  NA 137  K 4.2  CL 98  CO2 28  GLUCOSE 132*  BUN 25*  CREATININE 6.68*  CALCIUM 8.7*   GFR: Estimated Creatinine Clearance: 9.3 mL/min (A) (by C-G formula based on SCr of 6.68 mg/dL (H)). Liver Function Tests: No results for input(s): AST, ALT, ALKPHOS, BILITOT, PROT, ALBUMIN in the last 168 hours. No results for input(s): LIPASE, AMYLASE in the last 168 hours. No results for input(s): AMMONIA in the last 168 hours. Coagulation Profile: No results for input(s): INR, PROTIME in the last 168 hours. Cardiac Enzymes: No results for input(s): CKTOTAL, CKMB, CKMBINDEX, TROPONINI in the last 168 hours. BNP (last 3 results) No results for input(s): PROBNP in the last 8760 hours. HbA1C: No results for input(s): HGBA1C in the last  72 hours. CBG: No results for input(s): GLUCAP in the last 168 hours. Lipid Profile: No results for input(s): CHOL, HDL, LDLCALC, TRIG, CHOLHDL, LDLDIRECT in the last 72 hours. Thyroid Function Tests: No results for input(s): TSH, T4TOTAL, FREET4, T3FREE, THYROIDAB in the last 72 hours. Anemia Panel: No results for input(s): VITAMINB12, FOLATE, FERRITIN, TIBC, IRON, RETICCTPCT in the last 72 hours. Sepsis Labs: No results for input(s): PROCALCITON, LATICACIDVEN in the last 168 hours.  Recent Results (from the past 240  hour(s))  SARS Coronavirus 2 by RT PCR (hospital order, performed in [facility] hospital lab) Nasopharyngeal Nasopharyngeal Swab     Status: None   Collection Time: 06/03/20  6:49 AM   Specimen: Nasopharyngeal Swab  Result Value Ref Range Status   SARS Coronavirus 2 NEGATIVE NEGATIVE Final    Comment: (NOTE) SARS-CoV-2 target nucleic acids are NOT DETECTED.  The SARS-CoV-2 RNA is generally detectable in upper and lower respiratory specimens during the acute phase of infection. The lowest concentration of SARS-CoV-2 viral copies this assay can detect is 250 copies / mL. A negative result does not preclude SARS-CoV-2 infection and should not be used as the sole basis for treatment or other patient management decisions.  A negative result may occur with improper specimen collection / handling, submission of specimen other than nasopharyngeal swab, presence of viral mutation(s) within the areas targeted by this assay, and inadequate number of viral copies (<250 copies / mL). A negative result must be combined with clinical observations, patient history, and epidemiological information.  Fact Sheet for Patients:   StrictlyIdeas.no  Fact Sheet for Healthcare Providers: BankingDealers.co.za  This test is not yet approved or  cleared by the [place] FDA and has been authorized for detection and/or diagnosis of SARS-CoV-2 by FDA under an Emergency Use Authorization (EUA).  This EUA will remain in effect (meaning this test can be used) for the duration of the COVID-19 declaration under Section 564(b)(1) of the Act, 21 U.S.C. section 360bbb-3(b)(1), unless the authorization is terminated or revoked sooner.  Performed at [facility], [address] [place], [place] [address]          Radiology Studies: DG Chest Portable 1 View  Result Date: 06/02/2020 CLINICAL DATA:  Shortness of breath EXAM: PORTABLE CHEST 1 VIEW  COMPARISON:  CT 05/22/2020, radiograph 05/22/2020 FINDINGS: Bilateral pleural thickening and small basilar effusions. Mixed interstitial and patchy opacities towards the mid to lower lungs likely reflecting a combination of interstitial and developing alveolar edema with central cuffing, vascular cephalization and congestion. Cardiomegaly is similar to priors. Evidence of prior bilateral vascular stenting in the left brachiocephalic/subclavian vein and right upper arm. A right upper extremity HeRO graft is also noted, terminating at the level the right atrium. Removal of a right lower extremity central venous catheter. Telemetry leads overlie the chest. Mild body wall edema. No other acute or suspicious soft tissue or osseous abnormality. Degenerative changes are present in the imaged spine and shoulders. IMPRESSION: Features of CHF/volume overload with cardiomegaly, edema and small bilateral effusions. More patchy opacities could reflect alveolar edema versus underlying infection. Electronically Signed   By: Lovena Le M.D.   On: 06/02/2020 18:37        Scheduled Meds: . heparin  5,000 Units Subcutaneous Q8H  .  morphine injection  4 mg Intravenous Once   Continuous Infusions:   LOS: 0 days    Time spent: 33 mins     Wyvonnia Dusky, MD [facility] Pager [phone]  If 7PM-7AM, please contact night-coverage [email or web address] 06/03/2020, 8:03 AM

## 2020-06-03 NOTE — ED Notes (Signed)
Dialysis at bedside

## 2020-06-03 NOTE — ED Notes (Signed)
Pt calling out loudly for pain medication, wailing and crying for help, reports pain is 10/10.  Randol Kern NP notified and order was placed for morphine.

## 2020-06-03 NOTE — ED Notes (Signed)
Pt given two warm blankets.  

## 2020-06-03 NOTE — ED Notes (Signed)
Pt was given two warm blankets.

## 2020-06-03 NOTE — ED Notes (Signed)
Lab contacted this RN to report that pt refused to have blood cultures collected. Pt would not even allow lab to look for a site. MD notified of pt refusal at this time

## 2020-06-04 LAB — GLUCOSE, CAPILLARY
Glucose-Capillary: 100 mg/dL — ABNORMAL HIGH (ref 70–99)
Glucose-Capillary: 115 mg/dL — ABNORMAL HIGH (ref 70–99)
Glucose-Capillary: 114 mg/dL — ABNORMAL HIGH (ref 70–99)
Glucose-Capillary: 74 mg/dL (ref 70–99)
Glucose-Capillary: 90 mg/dL (ref 70–99)

## 2020-06-04 LAB — GASTROINTESTINAL PANEL BY PCR, STOOL (REPLACES STOOL CULTURE)

## 2020-06-04 LAB — BASIC METABOLIC PANEL
Anion gap: 9 (ref 5–15)
BUN: 17 mg/dL (ref 8–23)
CO2: 30 mmol/L (ref 22–32)
Calcium: 7.8 mg/dL — ABNORMAL LOW (ref 8.9–10.3)
Chloride: 97 mmol/L — ABNORMAL LOW (ref 98–111)
Creatinine, Ser: 4.8 mg/dL — ABNORMAL HIGH (ref 0.44–1.00)
GFR calc Af Amer: 11 mL/min — ABNORMAL LOW (ref >60)
GFR calc non Af Amer: 9 mL/min — ABNORMAL LOW (ref >60)
Glucose, Bld: 98 mg/dL (ref 70–99)
Potassium: 3.7 mmol/L (ref 3.5–5.1)
Sodium: 136 mmol/L (ref 135–145)

## 2020-06-04 LAB — CBC
HCT: 23.8 % — ABNORMAL LOW (ref 36.0–46.0)
Hemoglobin: 7.7 g/dL — ABNORMAL LOW (ref 12.0–15.0)
MCH: 28.8 pg (ref 26.0–34.0)
MCHC: 32.4 g/dL (ref 30.0–36.0)
MCV: 89.1 fL (ref 80.0–100.0)
Platelets: 71 10*3/uL — ABNORMAL LOW (ref 150–400)
RBC: 2.67 MIL/uL — ABNORMAL LOW (ref 3.87–5.11)
RDW: 15.8 % — ABNORMAL HIGH (ref 11.5–15.5)
WBC: 3.3 10*3/uL — ABNORMAL LOW (ref 4.0–10.5)
nRBC: 0 % (ref 0.0–0.2)

## 2020-06-04 MED ORDER — LOPERAMIDE HCL 2 MG PO CAPS
2.0000 mg | ORAL_CAPSULE | ORAL | Status: DC | PRN
  Administered 2020-06-23 (×2): 2 mg via ORAL
  Filled 2020-06-04 (×2): qty 1

## 2020-06-04 MED ORDER — OXYCODONE-ACETAMINOPHEN 5-325 MG PO TABS
1.0000 | ORAL_TABLET | ORAL | Status: DC | PRN
  Administered 2020-06-04 – 2020-06-25 (×32): 1 via ORAL
  Filled 2020-06-04 (×35): qty 1

## 2020-06-04 MED ORDER — MORPHINE SULFATE (PF) 2 MG/ML IV SOLN
1.0000 mg | INTRAVENOUS | Status: DC | PRN
  Administered 2020-06-09 – 2020-06-10 (×3): 1 mg via INTRAVENOUS
  Filled 2020-06-04 (×4): qty 1

## 2020-06-04 NOTE — Progress Notes (Signed)
[facility]  ROUNDING NOTE   Subjective:  Patient reports feeling better today,appears in no acute distress.    Objective:  Vital signs in last 24 hours:  Temp:  [98.1 F (36.7 C)-101.2 F (38.4 C)] 98.1 F (36.7 C) (09/13 0616) Pulse Rate:  [70-81] 70 (09/13 0616) Resp:  [15-20] 16 (09/13 0616) BP: (144-197)/(63-76) 166/63 (09/13 0616) SpO2:  [100 %] 100 % (09/13 0616)  Weight change:  Filed Weights   06/02/20 1056  Weight: 80.7 kg    Intake/Output: I/O last 3 completed shifts: In: 767.7 [I.V.:414.4; IV Piggyback:353.2] Out: 009 [Other:881]   Intake/Output this shift:  No intake/output data recorded.  Physical Exam: General:  Resting in bed, awake,alert  Head:  Normocephalic, atraumatic. Moist oral mucosal membranes  Eyes:  Sclerae and conjunctivae clear  Neck:  Supple  Lungs:   Crackles + bilaterally  Heart:  S1S2 no rubs  Abdomen:   Soft, nontender, bowel sounds present  Extremities:  1+ peripheral edema.  Neurologic:  Able to follow commands  Skin:  No acute  Lesions or rashes  Access:  Right upper extremity AV access    Basic Metabolic Panel: Recent Labs  Lab 06/02/20 1907 06/03/20 0744  NA 137  --   K 4.2  --   CL 98  --   CO2 28  --   GLUCOSE 132*  --   BUN 25*  --   CREATININE 6.68* 4.56*  CALCIUM 8.7*  --     Liver Function Tests: No results for input(s): AST, ALT, ALKPHOS, BILITOT, PROT, ALBUMIN in the last 168 hours. No results for input(s): LIPASE, AMYLASE in the last 168 hours. No results for input(s): AMMONIA in the last 168 hours.  CBC: Recent Labs  Lab 06/02/20 1907 06/03/20 0744  WBC 5.4 8.1  NEUTROABS 2.0  --   HGB 8.9* 8.0*  HCT 26.5* 24.9*  MCV 88.6 90.2  PLT 130* 118*    Cardiac Enzymes: No results for input(s): CKTOTAL, CKMB, CKMBINDEX, TROPONINI in the last 168 hours.  BNP: Invalid input(s): POCBNP  CBG: Recent Labs  Lab 06/03/20 1954 06/03/20 2016 06/04/20 0110 06/04/20 0618 06/04/20 0802   GLUCAP 36* 157* 100* 115* 114*    Microbiology: Results for orders placed or performed during the hospital encounter of 06/02/20  SARS Coronavirus 2 by RT PCR (hospital order, performed in [facility] hospital lab) Nasopharyngeal Nasopharyngeal Swab     Status: None   Collection Time: 06/03/20  6:49 AM   Specimen: Nasopharyngeal Swab  Result Value Ref Range Status   SARS Coronavirus 2 NEGATIVE NEGATIVE Final    Comment: (NOTE) SARS-CoV-2 target nucleic acids are NOT DETECTED.  The SARS-CoV-2 RNA is generally detectable in upper and lower respiratory specimens during the acute phase of infection. The lowest concentration of SARS-CoV-2 viral copies this assay can detect is 250 copies / mL. A negative result does not preclude SARS-CoV-2 infection and should not be used as the sole basis for treatment or other patient management decisions.  A negative result may occur with improper specimen collection / handling, submission of specimen other than nasopharyngeal swab, presence of viral mutation(s) within the areas targeted by this assay, and inadequate number of viral copies (<250 copies / mL). A negative result must be combined with clinical observations, patient history, and epidemiological information.  Fact Sheet for Patients:   StrictlyIdeas.no  Fact Sheet for Healthcare Providers: BankingDealers.co.za  This test is not yet approved or  cleared by the [place]  FDA and has been authorized for detection and/or diagnosis of SARS-CoV-2 by FDA under an Emergency Use Authorization (EUA).  This EUA will remain in effect (meaning this test can be used) for the duration of the COVID-19 declaration under Section 564(b)(1) of the Act, 21 U.S.C. section 360bbb-3(b)(1), unless the authorization is terminated or revoked sooner.  Performed at [facility], [address] [place], [place] [address]     Coagulation  Studies: No results for input(s): LABPROT, INR in the last 72 hours.  Urinalysis: No results for input(s): COLORURINE, LABSPEC, PHURINE, GLUCOSEU, HGBUR, BILIRUBINUR, KETONESUR, PROTEINUR, UROBILINOGEN, NITRITE, LEUKOCYTESUR in the last 72 hours.  Invalid input(s): APPERANCEUR    Imaging: DG Chest Portable 1 View  Result Date: 06/02/2020 CLINICAL DATA:  Shortness of breath EXAM: PORTABLE CHEST 1 VIEW COMPARISON:  CT 05/22/2020, radiograph 05/22/2020 FINDINGS: Bilateral pleural thickening and small basilar effusions. Mixed interstitial and patchy opacities towards the mid to lower lungs likely reflecting a combination of interstitial and developing alveolar edema with central cuffing, vascular cephalization and congestion. Cardiomegaly is similar to priors. Evidence of prior bilateral vascular stenting in the left brachiocephalic/subclavian vein and right upper arm. A right upper extremity HeRO graft is also noted, terminating at the level the right atrium. Removal of a right lower extremity central venous catheter. Telemetry leads overlie the chest. Mild body wall edema. No other acute or suspicious soft tissue or osseous abnormality. Degenerative changes are present in the imaged spine and shoulders. IMPRESSION: Features of CHF/volume overload with cardiomegaly, edema and small bilateral effusions. More patchy opacities could reflect alveolar edema versus underlying infection. Electronically Signed   By: Lovena Le M.D.   On: 06/02/2020 18:37     Medications:   . azithromycin Stopped (06/03/20 1715)  . cefTRIAXone (ROCEPHIN)  IV Stopped (06/03/20 1435)  . dextrose 50 mL/hr at 06/04/20 0432   . atorvastatin  20 mg Oral Daily  . carvedilol  6.25 mg Oral BID WC  . Chlorhexidine Gluconate Cloth  6 each Topical Daily  . epoetin (EPOGEN/PROCRIT) injection  10,000 Units Intravenous Q M,W,F-HD  . furosemide  80 mg Oral Daily  . heparin  5,000 Units Subcutaneous Q8H  . lipase/protease/amylase   12,000 Units Oral TID WC  .  morphine injection  4 mg Intravenous Once  . sevelamer carbonate  800 mg Oral TID WC   acetaminophen **OR** acetaminophen, calcium carbonate, camphor-menthol **AND** hydrOXYzine, feeding supplement (NEPRO CARB STEADY), hydrALAZINE, morphine injection, ondansetron **OR** ondansetron (ZOFRAN) IV, oxyCODONE-acetaminophen, promethazine, sorbitol, zolpidem  Assessment/ Plan:  59 y.o. female with past medical history of ESRD on HD MWF, hypertension, COPD, diastolic heart failure, depression, diabetes mellitus type 2, hyperlipidemia, anemia of chronic kidney disease, secondary hyperparathyroidism, new dialysis access placement 04/12/2020 who presented with missed dialysis treatment and volume overload.  [facility]/MWF3/[place]/81kg.  1.  ESRD on HD MWF/volume overload/pulmonary edema.   Dialysis today Will continue MWF schedule  2.  Anemia of chronic kidney disease.   Hemoglobin 8.0.  Epogen 10000 units IV with dialysis  3.  Secondary hyperparathyroidism.  Renvela restarted yesterday Will continue monitoring lab values     LOS: 1 [name] 9/13/202110:31 AM

## 2020-06-04 NOTE — Progress Notes (Signed)
OT Cancellation Note  Patient Details Name: [name] MRN: [id number] DOB: Jan 06, 1961   Cancelled Treatment:    Reason Eval/Treat Not Completed: Patient at procedure or test/ unavailable. Consult received, chart reviewed. Pt out of the room. Will re-attempt OT evaluation at later date/time as pt is available and medically appropriate.   Jeni Salles, MPH, MS, OTR/L ascom [phone] 06/04/20, 10:29 AM

## 2020-06-04 NOTE — Progress Notes (Signed)
[name] MRN: [id number] DOB: 1961-03-17   Cancelled Treatment:    Reason Eval/Treat Not Completed: Patient at procedure or test/unavailable. Will re-attempt later if time allows.    Soul Deveney 06/04/2020, 10:25 AM

## 2020-06-04 NOTE — Evaluation (Signed)
Physical Therapy Evaluation Patient Details Name: [name] MRN: [id number] DOB: 13-Feb-1961 Today's Date: 06/04/2020   History of Present Illness  [name] is a 59 y.o. female with medical history significant of end-stage renal disease on hemodialysis Mondays Wednesdays and Fridays, hypertension, COPD, diastolic dysfunction CHF, depression, diabetes, hyperlipidemia, who apparently missed her dialysis on Friday.  Came to the ER with complaining of significant shortness of breath and cough.  Clinical Impression  Patient received in bed, returned from dialysis. Reports she is cold and feels like she had a bowel movement. States she was sleeping in dialysis. She is moaning reports pain in head and back and being so cold that it hurts. She is found to indeed be soiled in bed. Bed required changing, therefore assisted patient in rolling and getting cleaned up with RN and NT assist. Patient requires mod assist for rolling. She will continue to benefit from skilled PT while here to improve strength, functional mobility and independence for hopeful return home at discharge. I feel that she will be able to perform transfers and gait at next session when she is feeling better.          Follow Up Recommendations Home health PT    Equipment Recommendations  None recommended by PT;Other (comment) (TBD)    Recommendations for Other Services       Precautions / Restrictions Precautions Precaution Comments: mod fall Restrictions Weight Bearing Restrictions: No      Mobility  Bed Mobility Overal bed mobility: Needs Assistance Bed Mobility: Rolling Rolling: Mod assist;+2 for physical assistance            Transfers                 General transfer comment: patient unable to attempt this visit due to pain/cold  Ambulation/Gait                Stairs            Wheelchair Mobility    Modified Rankin (Stroke Patients Only)       Balance                                              Pertinent Vitals/Pain Pain Assessment: Faces Faces Pain Scale: Hurts whole lot Pain Location: back, head Pain Descriptors / Indicators: Grimacing;Guarding;Moaning Pain Intervention(s): Monitored during session;Limited activity within patient's tolerance;RN gave pain meds during session    Home Living Family/patient expects to be discharged to:: Private residence Living Arrangements: Alone Available Help at Discharge: Family;Available PRN/intermittently Type of Home: Apartment Home Access: Level entry     Home Layout: One level Home Equipment: Walker - 4 wheels;Cane - single point;Cane - quad;Wheelchair - manual;Toilet riser Additional Comments: patient told me she drives, but per admission notes from 6 mo ago states she had a van or car that would take her to dialysis or shopping.    Prior Function Level of Independence: Independent               Hand Dominance        Extremity/Trunk Assessment   Upper Extremity Assessment Upper Extremity Assessment: Defer to OT evaluation    Lower Extremity Assessment Lower Extremity Assessment: Generalized weakness       Communication   Communication: No difficulties  Cognition Arousal/Alertness: Awake/alert Behavior During Therapy: WFL for tasks assessed/performed Overall Cognitive Status:  Within Functional Limits for tasks assessed                                 General Comments: patient limited by being cold, moaning out in pain      General Comments      Exercises     Assessment/Plan    PT Assessment Patient needs continued PT services  PT Problem List Decreased mobility;Decreased activity tolerance;Pain       PT Treatment Interventions Therapeutic activities;Gait training;Therapeutic exercise;Balance training;Functional mobility training;Patient/family education    PT Goals (Current goals can be found in the Care Plan section)  Acute Rehab PT  Goals Patient Stated Goal: to get warm PT Goal Formulation: With patient Time For Goal Achievement: 06/18/20 Potential to Achieve Goals: Good    Frequency Min 2X/week   Barriers to discharge Decreased caregiver support      Co-evaluation               AM-PAC PT "6 Clicks" Mobility  Outcome Measure Help needed turning from your back to your side while in a flat bed without using bedrails?: A Lot Help needed moving from lying on your back to sitting on the side of a flat bed without using bedrails?: A Lot Help needed moving to and from a bed to a chair (including a wheelchair)?: A Lot Help needed standing up from a chair using your arms (e.g., wheelchair or bedside chair)?: A Lot Help needed to walk in hospital room?: A Lot Help needed climbing 3-5 steps with a railing? : A Lot 6 Click Score: 12    End of Session Equipment Utilized During Treatment: Oxygen Activity Tolerance: Patient limited by pain Patient left: in bed;with nursing/sitter in room;with call bell/phone within reach Nurse Communication: Mobility status PT Visit Diagnosis: Muscle weakness (generalized) (M62.81);Other abnormalities of gait and mobility (R26.89);Pain Pain - part of body:  (back and head)    Time: 3903-0092 PT Time Calculation (min) (ACUTE ONLY): 15 min   Charges:   PT Evaluation $PT Eval Moderate Complexity: 1 Mod          Gisella Alwine, PT, GCS 06/04/20,3:28 PM

## 2020-06-04 NOTE — Progress Notes (Signed)
PROGRESS NOTE    [name]  XBJ:[id number] DOB: [birth year] DOA: 06/02/2020 PCP: [facility]   Assessment & Plan:   Principal Problem:   ESRD on hemodialysis [facility]) Active Problems:   Anemia of chronic disease   Acute diastolic CHF (congestive heart failure) ([place])   COPD (chronic obstructive pulmonary disease) ([place])   CAP (community acquired pneumonia)   Benign essential HTN  ESRD: w/ fluid overload. Secondary to missed HD. Continue on HD as per nephro   Pneumonia: w/ fevers. Procal is 20.1. Blood cxs ordered but pt refused. Will continue IV azithromycin, ceftriaxone. Legionella, strep are ordered but not collected. Mycoplasma is pending. COVID19 neg   ACD: secondary to ESRD. Will transfuse if Hb <7.0. Will continue to monitor   Thrombocytopenia: etiology unclear. Will continue to monitor   COPD: w/o exacerbation. Continue on bronchodilators and encourage incentive spirometry   HTN emergency: likely secondary to missed HD. Continue on HD as per nephro. Will continue on home dose of coreg, lasix. IV hydralazine prn  Acute on chronic diastolic CHF: likely secondary to missed HD. Continue on HD. Continue on dose of lasix   HLD: continue on statin   DVT prophylaxis: heparin  Code Status: full  Family Communication:  Disposition Plan: depends on PT/OT recs   Status is: Inpatient  Remains inpatient appropriate because:IV treatments appropriate due to intensity of illness or inability to take PO   Dispo: The patient is from: Home              Anticipated d/c is to: SNF vs home w/ home health (depends on PT/OT recs)              Anticipated d/c date is: 3 days              Patient currently is not medically stable to d/c.         Consultants:   nephro    Procedures:    Antimicrobials:    Subjective: Pt c/o shoulder pain   Objective: Vitals:   06/04/20 1215 06/04/20 1230 06/04/20 1245 06/04/20 1300  BP: (!) 178/60 (!) 178/62  (!) 172/64 (!) 163/59  Pulse: 69 67 66 61  Resp: 12 10 (!) 9 (!) 9  Temp:      TempSrc:      SpO2:      Weight:      Height:        Intake/Output Summary (Last 24 hours) at 06/04/2020 1349 Last data filed at 06/04/2020 0500 Gross per 24 hour  Intake 767.65 ml  Output 0 ml  Net 767.65 ml   Filed Weights   06/02/20 1056  Weight: 80.7 kg    Examination:  General exam: Appears calm but uncomfortable. Appears older than stated age Respiratory system: decreased breath sounds b/l. No rales Cardiovascular system: S1 & S2 +. No rubs, gallops or clicks. Gastrointestinal system: Abdomen is nondistended, soft and nontender. Hypoactive bowel sounds Central nervous system: Alert & oriented. Moves all 4 extremities Psychiatry: Judgement and insight appear abnormal. Flat mood and affect.    Data Reviewed: I have personally reviewed following labs and imaging studies  CBC: Recent Labs  Lab 06/02/20 1907 06/03/20 0744 06/04/20 0658  WBC 5.4 8.1 3.3*  NEUTROABS 2.0  --   --   HGB 8.9* 8.0* 7.7*  HCT 26.5* 24.9* 23.8*  MCV 88.6 90.2 89.1  PLT 130* 118* 71*   Basic Metabolic Panel: Recent Labs  Lab 06/02/20 1907 06/03/20  1275 06/04/20 0658  NA 137  --  136  K 4.2  --  3.7  CL 98  --  97*  CO2 28  --  30  GLUCOSE 132*  --  98  BUN 25*  --  17  CREATININE 6.68* 4.56* 4.80*  CALCIUM 8.7*  --  7.8*   GFR: Estimated Creatinine Clearance: 12.9 mL/min (A) (by C-G formula based on SCr of 4.8 mg/dL (H)). Liver Function Tests: No results for input(s): AST, ALT, ALKPHOS, BILITOT, PROT, ALBUMIN in the last 168 hours. No results for input(s): LIPASE, AMYLASE in the last 168 hours. No results for input(s): AMMONIA in the last 168 hours. Coagulation Profile: No results for input(s): INR, PROTIME in the last 168 hours. Cardiac Enzymes: No results for input(s): CKTOTAL, CKMB, CKMBINDEX, TROPONINI in the last 168 hours. BNP (last 3 results) No results for input(s): PROBNP in the last  8760 hours. HbA1C: No results for input(s): HGBA1C in the last 72 hours. CBG: Recent Labs  Lab 06/03/20 1954 06/03/20 2016 06/04/20 0110 06/04/20 0618 06/04/20 0802  GLUCAP 36* 157* 100* 115* 114*   Lipid Profile: No results for input(s): CHOL, HDL, LDLCALC, TRIG, CHOLHDL, LDLDIRECT in the last 72 hours. Thyroid Function Tests: No results for input(s): TSH, T4TOTAL, FREET4, T3FREE, THYROIDAB in the last 72 hours. Anemia Panel: No results for input(s): VITAMINB12, FOLATE, FERRITIN, TIBC, IRON, RETICCTPCT in the last 72 hours. Sepsis Labs: Recent Labs  Lab 06/03/20 1404  PROCALCITON 20.16    Recent Results (from the past 240 hour(s))  SARS Coronavirus 2 by RT PCR (hospital order, performed in [facility] hospital lab) Nasopharyngeal Nasopharyngeal Swab     Status: None   Collection Time: 06/03/20  6:49 Robin   Specimen: Nasopharyngeal Swab  Result Value Ref Range Status   SARS Coronavirus 2 NEGATIVE NEGATIVE Final    Comment: (NOTE) SARS-CoV-2 target nucleic acids are NOT DETECTED.  The SARS-CoV-2 RNA is generally detectable in upper and lower respiratory specimens during the acute phase of infection. The lowest concentration of SARS-CoV-2 viral copies this assay can detect is 250 copies / mL. A negative result does not preclude SARS-CoV-2 infection and should not be used as the sole basis for treatment or other patient management decisions.  A negative result may occur with improper specimen collection / handling, submission of specimen other than nasopharyngeal swab, presence of viral mutation(s) within the areas targeted by this assay, and inadequate number of viral copies (<250 copies / mL). A negative result must be combined with clinical observations, patient history, and epidemiological information.  Fact Sheet for Patients:   StrictlyIdeas.no  Fact Sheet for Healthcare Providers: BankingDealers.co.za  This test is  not yet approved or  cleared by the [place] FDA and has been authorized for detection and/or diagnosis of SARS-CoV-2 by FDA under an Emergency Use Authorization (EUA).  This EUA will remain in effect (meaning this test can be used) for the duration of the COVID-19 declaration under Section 564(b)(1) of the Act, 21 U.S.C. section 360bbb-3(b)(1), unless the authorization is terminated or revoked sooner.  Performed at [facility], [address] [place], [place] [address]          Radiology Studies: DG Chest Portable 1 View  Result Date: 06/02/2020 CLINICAL DATA:  Shortness of breath EXAM: PORTABLE CHEST 1 VIEW COMPARISON:  CT 05/22/2020, radiograph 05/22/2020 FINDINGS: Bilateral pleural thickening and small basilar effusions. Mixed interstitial and patchy opacities towards the mid to lower lungs likely reflecting a  combination of interstitial and developing alveolar edema with central cuffing, vascular cephalization and congestion. Cardiomegaly is similar to priors. Evidence of prior bilateral vascular stenting in the left brachiocephalic/subclavian vein and right upper arm. A right upper extremity HeRO graft is also noted, terminating at the level the right atrium. Removal of a right lower extremity central venous catheter. Telemetry leads overlie the chest. Mild body wall edema. No other acute or suspicious soft tissue or osseous abnormality. Degenerative changes are present in the imaged spine and shoulders. IMPRESSION: Features of CHF/volume overload with cardiomegaly, edema and small bilateral effusions. More patchy opacities could reflect alveolar edema versus underlying infection. Electronically Signed   By: Lovena Le M.D.   On: 06/02/2020 18:37        Scheduled Meds: . atorvastatin  20 mg Oral Daily  . carvedilol  6.25 mg Oral BID WC  . Chlorhexidine Gluconate Cloth  6 each Topical Daily  . epoetin (EPOGEN/PROCRIT) injection  10,000 Units Intravenous Q  M,W,F-HD  . furosemide  80 mg Oral Daily  . heparin  5,000 Units Subcutaneous Q8H  . lipase/protease/amylase  12,000 Units Oral TID WC  .  morphine injection  4 mg Intravenous Once  . sevelamer carbonate  800 mg Oral TID WC   Continuous Infusions: . azithromycin Stopped (06/03/20 1715)  . cefTRIAXone (ROCEPHIN)  IV Stopped (06/03/20 1435)  . dextrose 50 mL/hr at 06/04/20 0432     LOS: 1 day    Time spent: 31 mins     Wyvonnia Dusky, MD [facility] Pager [phone]  If 7PM-7AM, please contact night-coverage [email or web address] 06/04/2020, 1:49 PM

## 2020-06-05 ENCOUNTER — Inpatient Hospital Stay: Payer: Medicare HMO

## 2020-06-05 LAB — GLUCOSE, CAPILLARY
Glucose-Capillary: 118 mg/dL — ABNORMAL HIGH (ref 70–99)
Glucose-Capillary: 154 mg/dL — ABNORMAL HIGH (ref 70–99)
Glucose-Capillary: 171 mg/dL — ABNORMAL HIGH (ref 70–99)
Glucose-Capillary: 215 mg/dL — ABNORMAL HIGH (ref 70–99)
Glucose-Capillary: 208 mg/dL — ABNORMAL HIGH (ref 70–99)
Glucose-Capillary: 199 mg/dL — ABNORMAL HIGH (ref 70–99)

## 2020-06-05 LAB — CBC
HCT: 27 % — ABNORMAL LOW (ref 36.0–46.0)
Hemoglobin: 8.2 g/dL — ABNORMAL LOW (ref 12.0–15.0)
MCH: 28.5 pg (ref 26.0–34.0)
MCHC: 30.4 g/dL (ref 30.0–36.0)
MCV: 93.8 fL (ref 80.0–100.0)
Platelets: 77 10*3/uL — ABNORMAL LOW (ref 150–400)
RBC: 2.88 MIL/uL — ABNORMAL LOW (ref 3.87–5.11)
RDW: 15.9 % — ABNORMAL HIGH (ref 11.5–15.5)
WBC: 8.3 10*3/uL (ref 4.0–10.5)
nRBC: 0 % (ref 0.0–0.2)

## 2020-06-05 LAB — BASIC METABOLIC PANEL
Anion gap: 14 (ref 5–15)
BUN: 15 mg/dL (ref 8–23)
CO2: 27 mmol/L (ref 22–32)
Calcium: 7.8 mg/dL — ABNORMAL LOW (ref 8.9–10.3)
Chloride: 93 mmol/L — ABNORMAL LOW (ref 98–111)
Creatinine, Ser: 4.3 mg/dL — ABNORMAL HIGH (ref 0.44–1.00)
GFR calc Af Amer: 12 mL/min — ABNORMAL LOW (ref >60)
GFR calc non Af Amer: 10 mL/min — ABNORMAL LOW (ref >60)
Glucose, Bld: 268 mg/dL — ABNORMAL HIGH (ref 70–99)
Potassium: 3.8 mmol/L (ref 3.5–5.1)
Sodium: 134 mmol/L — ABNORMAL LOW (ref 135–145)

## 2020-06-05 LAB — MYCOPLASMA PNEUMONIAE ANTIBODY, IGM: Mycoplasma pneumo IgM: <770 U/mL (ref 0–769)

## 2020-06-05 MED ORDER — NALOXONE HCL 0.4 MG/ML IJ SOLN
INTRAMUSCULAR | Status: AC
  Administered 2020-06-05: 0.4 mg
  Filled 2020-06-05: qty 1

## 2020-06-05 MED ORDER — IPRATROPIUM-ALBUTEROL 0.5-2.5 (3) MG/3ML IN SOLN
3.0000 mL | RESPIRATORY_TRACT | Status: DC | PRN
  Administered 2020-06-05 – 2020-06-06 (×2): 3 mL via RESPIRATORY_TRACT
  Filled 2020-06-05 (×2): qty 3

## 2020-06-05 MED ORDER — NALOXONE HCL 0.4 MG/ML IJ SOLN
0.4000 mg | INTRAMUSCULAR | Status: DC | PRN
  Administered 2020-06-05: 0.4 mg via INTRAVENOUS

## 2020-06-05 MED ORDER — TECHNETIUM TO 99M ALBUMIN AGGREGATED
4.7240 | Freq: Once | INTRAVENOUS | Status: AC | PRN
  Administered 2020-06-05: 4.724 via INTRAVENOUS

## 2020-06-05 MED ORDER — HYDROMORPHONE HCL 1 MG/ML IJ SOLN
1.0000 mg | INTRAMUSCULAR | Status: AC
  Administered 2020-06-05: 1 mg via INTRAVENOUS
  Filled 2020-06-05: qty 1

## 2020-06-05 NOTE — Treatment Plan (Signed)
Patient refusing assessment and labs at this time. MD Jimmye Norman made aware of patients refusal. Pt will not allow CHG bath either.

## 2020-06-05 NOTE — Progress Notes (Signed)
   06/05/20 1154  Clinical Encounter Type  Visited With Patient;Health care provider  Visit Type Initial  Referral From Chaplain  Consult/Referral To [place] responded to to RR and stayed until she realized she wasn't needed.

## 2020-06-05 NOTE — Progress Notes (Signed)
   06/05/20 1221  Vitals  BP (!) 148/87  MAP (mmHg) 105  BP Location Right Arm  BP Method Automatic  Patient Position (if appropriate) Lying  Pulse Rate 72  Resp 20  Level of Consciousness  Level of Consciousness Alert  MEWS COLOR  MEWS Score Color Green  Oxygen Therapy  SpO2 100 %  O2 Device Nasal Cannula  O2 Flow Rate (L/min) 3 L/min  Patient Activity (if Appropriate) In bed  MEWS Score  MEWS Temp 0  MEWS Systolic 0  MEWS Pulse 0  MEWS RR 0  MEWS LOC 0  MEWS Score 0  Post Narcan VS

## 2020-06-05 NOTE — Progress Notes (Signed)
[facility]  ROUNDING NOTE   Subjective:  Patient complaining of right sided chest pain. Case discussed with Dr. Jimmye Norman. CTA chest to be ordered.    Objective:  Vital signs in last 24 hours:  Temp:  [98.5 F (36.9 C)-99.3 F (37.4 C)] 98.5 F (36.9 C) (09/13 2021) Pulse Rate:  [61-79] 69 (09/14 0811) Resp:  [8-20] 18 (09/14 0811) BP: (143-178)/(56-73) 160/66 (09/14 0811) SpO2:  [91 %-100 %] 91 % (09/13 2021) Weight:  [82 kg] 82 kg (09/14 0500)  Weight change:  Filed Weights   06/02/20 1056 06/05/20 0500  Weight: 80.7 kg 82 kg    Intake/Output: I/O last 3 completed shifts: In: 1444.6 [I.V.:841.4; IV Piggyback:603.2] Out: 2500 [Other:2500]   Intake/Output this shift:  No intake/output data recorded.  Physical Exam: General:  Mild distress from chest wall pain  Head:  Oral mucosal membranes moist  Eyes:  Anicteric  Neck:  Supple  Lungs:   Fine crackles bilaterally  Heart:  Regular, S1S2   Abdomen:   Soft, nontender, non distended  Extremities:  Trace peripheral edema.  Neurologic:  Able to follow commands  Skin:  No acute  Lesions or rashes  Access:  Right upper extremity AV access    Basic Metabolic Panel: Recent Labs  Lab 06/02/20 1907 06/03/20 0744 06/04/20 0658  NA 137  --  136  K 4.2  --  3.7  CL 98  --  97*  CO2 28  --  30  GLUCOSE 132*  --  98  BUN 25*  --  17  CREATININE 6.68* 4.56* 4.80*  CALCIUM 8.7*  --  7.8*    Liver Function Tests: No results for input(s): AST, ALT, ALKPHOS, BILITOT, PROT, ALBUMIN in the last 168 hours. No results for input(s): LIPASE, AMYLASE in the last 168 hours. No results for input(s): AMMONIA in the last 168 hours.  CBC: Recent Labs  Lab 06/02/20 1907 06/03/20 0744 06/04/20 0658  WBC 5.4 8.1 3.3*  NEUTROABS 2.0  --   --   HGB 8.9* 8.0* 7.7*  HCT 26.5* 24.9* 23.8*  MCV 88.6 90.2 89.1  PLT 130* 118* 71*    Cardiac Enzymes: No results for input(s): CKTOTAL, CKMB, CKMBINDEX, TROPONINI in  the last 168 hours.  BNP: Invalid input(s): POCBNP  CBG: Recent Labs  Lab 06/04/20 1642 06/04/20 2025 06/05/20 0030 06/05/20 0503 06/05/20 0749  GLUCAP 74 90 118* 154* 171*    Microbiology: Results for orders placed or performed during the hospital encounter of 06/02/20  SARS Coronavirus 2 by RT PCR (hospital order, performed in [facility] hospital lab) Nasopharyngeal Nasopharyngeal Swab     Status: None   Collection Time: 06/03/20  6:49 AM   Specimen: Nasopharyngeal Swab  Result Value Ref Range Status   SARS Coronavirus 2 NEGATIVE NEGATIVE Final    Comment: (NOTE) SARS-CoV-2 target nucleic acids are NOT DETECTED.  The SARS-CoV-2 RNA is generally detectable in upper and lower respiratory specimens during the acute phase of infection. The lowest concentration of SARS-CoV-2 viral copies this assay can detect is 250 copies / mL. A negative result does not preclude SARS-CoV-2 infection and should not be used as the sole basis for treatment or other patient management decisions.  A negative result may occur with improper specimen collection / handling, submission of specimen other than nasopharyngeal swab, presence of viral mutation(s) within the areas targeted by this assay, and inadequate number of viral copies (<250 copies / mL). A negative result must be combined  with clinical observations, patient history, and epidemiological information.  Fact Sheet for Patients:   StrictlyIdeas.no  Fact Sheet for Healthcare Providers: BankingDealers.co.za  This test is not yet approved or  cleared by the [place] FDA and has been authorized for detection and/or diagnosis of SARS-CoV-2 by FDA under an Emergency Use Authorization (EUA).  This EUA will remain in effect (meaning this test can be used) for the duration of the COVID-19 declaration under Section 564(b)(1) of the Act, 21 U.S.C. section 360bbb-3(b)(1), unless the  authorization is terminated or revoked sooner.  Performed at [facility], [address] [place], [place] [address]   Gastrointestinal Panel by PCR , Stool     Status: None   Collection Time: 06/04/20  7:39 PM   Specimen: Stool  Result Value Ref Range Status   Campylobacter species NOT DETECTED NOT DETECTED Final   Plesimonas shigelloides NOT DETECTED NOT DETECTED Final   Salmonella species NOT DETECTED NOT DETECTED Final   Yersinia enterocolitica NOT DETECTED NOT DETECTED Final   Vibrio species NOT DETECTED NOT DETECTED Final   Vibrio cholerae NOT DETECTED NOT DETECTED Final   Enteroaggregative E coli (EAEC) NOT DETECTED NOT DETECTED Final   Enteropathogenic E coli (EPEC) NOT DETECTED NOT DETECTED Final   Enterotoxigenic E coli (ETEC) NOT DETECTED NOT DETECTED Final   Shiga like toxin producing E coli (STEC) NOT DETECTED NOT DETECTED Final   Shigella/Enteroinvasive E coli (EIEC) NOT DETECTED NOT DETECTED Final   Cryptosporidium NOT DETECTED NOT DETECTED Final   Cyclospora cayetanensis NOT DETECTED NOT DETECTED Final   Entamoeba histolytica NOT DETECTED NOT DETECTED Final   Giardia lamblia NOT DETECTED NOT DETECTED Final   Adenovirus F40/41 NOT DETECTED NOT DETECTED Final   Astrovirus NOT DETECTED NOT DETECTED Final   Norovirus GI/GII NOT DETECTED NOT DETECTED Final   Rotavirus A NOT DETECTED NOT DETECTED Final   Sapovirus (I, II, IV, and V) NOT DETECTED NOT DETECTED Final    Comment: Performed at [facility], [address] [place], [place] [address]  Culture, blood (Routine X 2) w Reflex to ID Panel     Status: None (Preliminary result)   Collection Time: 06/04/20  8:49 PM   Specimen: BLOOD  Result Value Ref Range Status   Specimen Description BLOOD BLOOD LEFT FOREARM  Final   Special Requests   Final    BOTTLES DRAWN AEROBIC AND ANAEROBIC Blood Culture adequate volume   Culture   Final    NO GROWTH < 12 HOURS Performed at [facility], [address] [place], [place] [address]    Report Status PENDING  Incomplete    Coagulation Studies: No results for input(s): LABPROT, INR in the last 72 hours.  Urinalysis: No results for input(s): COLORURINE, LABSPEC, PHURINE, GLUCOSEU, HGBUR, BILIRUBINUR, KETONESUR, PROTEINUR, UROBILINOGEN, NITRITE, LEUKOCYTESUR in the last 72 hours.  Invalid input(s): APPERANCEUR    Imaging: No results found.   Medications:   . azithromycin Stopped (06/04/20 1655)  . cefTRIAXone (ROCEPHIN)  IV Stopped (06/04/20 1535)  . dextrose 50 mL/hr at 06/05/20 0700   . atorvastatin  20 mg Oral Daily  . carvedilol  6.25 mg Oral BID WC  . Chlorhexidine Gluconate Cloth  6 each Topical Daily  . epoetin (EPOGEN/PROCRIT) injection  10,000 Units Intravenous Q M,W,F-HD  . furosemide  80 mg Oral Daily  . heparin  5,000 Units Subcutaneous Q8H  .  HYDROmorphone (DILAUDID) injection  1 mg Intravenous STAT  . lipase/protease/amylase  12,000 Units  Oral TID WC  .  morphine injection  4 mg Intravenous Once  . sevelamer carbonate  800 mg Oral TID WC   acetaminophen **OR** acetaminophen, calcium carbonate, camphor-menthol **AND** hydrOXYzine, feeding supplement (NEPRO CARB STEADY), hydrALAZINE, loperamide, morphine injection, ondansetron **OR** ondansetron (ZOFRAN) IV, oxyCODONE-acetaminophen, promethazine, sorbitol, zolpidem  Assessment/ Plan:  59 y.o. female with past medical history of ESRD on HD MWF, hypertension, COPD, diastolic heart failure, depression, diabetes mellitus type 2, hyperlipidemia, anemia of chronic kidney disease, secondary hyperparathyroidism, new dialysis access placement 04/12/2020 who presented with missed dialysis treatment and volume overload.  [facility]/MWF3/[facility]/81kg.  1.  ESRD on HD MWF/volume overload/pulmonary edema.   -Received dialysis yesterday -Fluid and electrolyte status acceptable -No additional dialysis required today  2.  Anemia of chronic kidney disease.   Hemoglobin 7.7  on 06/04/20 Continue Epogen with dialysis  3.  Secondary hyperparathyroidism.  Continue Renvela  Continue monitoring lab values   LOS: 2 [name] 9/14/202110:33 AM

## 2020-06-05 NOTE — Care Plan (Signed)
Called Son [name] at this time and updated him of call.

## 2020-06-05 NOTE — Progress Notes (Addendum)
OT Cancellation Note  Patient Details Name: TAKILA KRONBERG MRN: [id number] DOB: 1961/05/29   Cancelled Treatment:    Reason Eval/Treat Not Completed: Other (comment). Chart reviewed. Pt with pending CT angio for possible PE. Will hold OT evaluation at this time and re-attempt at later time pending imaging and updated plan of care.   Addendum, 11:58am: Rapid response team called to pt's room overhead. Per chart, pt now pending stat head CT. Will continue to hold OT evaluation at this time, and follow acutely for re-attempt as medically appropriate.   Jeni Salles, MPH, MS, OTR/L ascom [phone] 06/05/20, 9:30 AM

## 2020-06-05 NOTE — TOC Initial Note (Signed)
Transition of Care [facility]) - Initial/Assessment Note    Patient Details  Name: [name] MRN: [id number] Date of Birth: March 25, 1961  Transition [facility]) CM/SW Contact:    Beverly Sessions, RN Phone Number: 06/05/2020, 1:05 PM  Clinical Narrative:                 TOC went to room to room to complete assessment.  Patient BP was reading low at that time, and diaphoretic. Rapid response was called by RN at that time.    TOC will follow up at a later time         Patient Goals and CMS Choice        Expected Discharge Plan and Services                                                Prior Living Arrangements/Services                       Activities of Daily Living Home Assistive Devices/Equipment: Gilford Rile (specify type) ADL Screening (condition at time of admission) Patient's cognitive ability adequate to safely complete daily activities?: Yes Is the patient deaf or have difficulty hearing?: Yes Does the patient have difficulty seeing, even when wearing glasses/contacts?: No Does the patient have difficulty concentrating, remembering, or making decisions?: No Patient able to express need for assistance with ADLs?: Yes Does the patient have difficulty dressing or bathing?: No Independently performs ADLs?: Yes (appropriate for developmental age) Does the patient have difficulty walking or climbing stairs?: Yes Weakness of Legs: None Weakness of Arms/Hands: None  Permission Sought/Granted                  Emotional Assessment              Admission diagnosis:  ESRD (end stage renal disease) ([place]) [N18.6] CAP (community acquired pneumonia) [J18.9] ESRD on dialysis ([place]) [N18.6, Z99.2] Dyspnea, unspecified type [R06.00] Acute on chronic congestive heart failure, unspecified heart failure type ([place]) [I50.9] Patient Active Problem List   Diagnosis Date Noted  . Anemia 05/31/2020  . Type 1 diabetes mellitus ([place]) 05/31/2020  . End-stage  renal disease ([place]) 05/31/2020  . Hypertensive disorder 05/31/2020  . Arm pain 05/21/2020  . Cellulitis of right leg 05/02/2020  . ESRD (end stage renal disease) ([place]) 04/13/2020  . SOBOE (shortness of breath on exertion) 02/08/2020  . Hematoma of arm, left, subsequent encounter 11/24/2019  . Respiratory failure ([place])   . Shortness of breath   . Complication of arteriovenous dialysis fistula 11/13/2019  . PVD (peripheral vascular disease) ([place]) 11/13/2019  . Severe anemia 11/13/2019  . Pressure injury of skin 11/13/2019  . ESRD on hemodialysis ([place])   . Hemorrhagic shock ([place]) 11/11/2019  . Leg pain 10/02/2019  . CAP (community acquired pneumonia) 10/01/2019  . Atherosclerosis of native arteries of extremity with intermittent claudication ([place]) 05/01/2019  . Right-sided headache   . COPD (chronic obstructive pulmonary disease) ([place]) 10/06/2018  . Syncope 10/04/2018  . Symptomatic anemia 06/18/2018  . Gastroparesis due to DM ([place]) 01/18/2018  . Complication of vascular access for dialysis 12/04/2017  . Osteomyelitis ([place]) 09/30/2017  . Carotid stenosis 06/18/2017  . Bilateral carotid artery stenosis 06/16/2017  . Benign essential HTN 05/19/2017  . CKD (chronic kidney disease) stage 5, GFR less than 15 ml/min (  [place]) 05/19/2017  . Hyperlipidemia, mixed 05/19/2017  . Shortness of breath 05/04/2017  . Rash, skin 12/16/2016  . Cellulitis of lower extremity 07/29/2016  . Chronic venous insufficiency 07/29/2016  . Lymphedema 07/29/2016  . TIA (transient ischemic attack) 04/21/2016  . Altered mental status 04/08/2016  . Hyperammonemia ([place]) 04/08/2016  . Elevated troponin 04/08/2016  . Depression 04/08/2016  . Depression, major, recurrent, severe with psychosis ([place]) 04/08/2016  . Blood in stool   . Intractable cyclical vomiting with nausea   . Reflux esophagitis   . Gastritis   . Generalized abdominal pain   . Uncontrollable vomiting   . Major depressive disorder, recurrent  episode, moderate ([place]) 03/15/2016  . Adjustment disorder with mixed anxiety and depressed mood 03/15/2016  . Malnutrition of moderate degree 12/01/2015  . Renal mass   . Dyspnea   . Acute renal failure ([place])   . Respiratory failure ([place])   . High temperature 11/14/2015  . Encounter for central line placement   . Encounter for orogastric (OG) tube placement   . Nausea 11/12/2015  . Hyperkalemia 10/03/2015  . Diarrhea, unspecified 07/22/2015  . Pneumonia 05/21/2015  . Hypoglycemia 04/24/2015  . Unresponsiveness 04/24/2015  . Bradycardia 04/24/2015  . Hypothermia 04/24/2015  . Acute respiratory failure ([place]) 04/24/2015  . Acute diastolic CHF (congestive heart failure) ([place]) 04/05/2015  . Diabetic gastroparesis ([place]) 04/05/2015  . Hypokalemia 04/05/2015  . Generalized weakness 04/05/2015  . Acute pulmonary edema ([place]) 04/03/2015  . Nausea and vomiting 04/03/2015  . Hypoglycemia associated with diabetes ([place]) 04/03/2015  . Anemia of chronic disease 04/03/2015  . Secondary hyperparathyroidism ([place]) 04/03/2015  . Pressure ulcer 04/02/2015  . Acute respiratory failure with hypoxia ([place]) 04/01/2015  . Adjustment disorder with anxiety 03/14/2015  . Somatic symptom disorder, mild 03/08/2015  . Coronary artery disease involving native coronary artery of native heart without angina pectoris   . Nausea & vomiting 03/06/2015  . Abdominal pain 03/06/2015  . DM (diabetes mellitus) ([place]) 03/06/2015  . HTN (hypertension) 03/06/2015  . Gastroparesis 02/24/2015  . Pleural effusion 02/19/2015  . HCAP (healthcare-associated pneumonia) 02/19/2015  . End-stage renal disease on hemodialysis ([place]) 02/19/2015  . Diarrhea 08/01/2014   PCP:  [facility], [place]:   [id number] [place], [address] [place] [address] Phone: [phone] Fax: [phone]     Social Determinants of Health (SDOH) Interventions    Readmission Risk  Interventions Readmission Risk Prevention Plan 05/03/2020  Transportation Screening Complete  Medication Review ([place]) Complete  PCP or Specialist appointment within 3-5 days of discharge Complete  SW Recovery Care/Counseling Consult Complete  [place] Not Applicable  Some recent data might be hidden

## 2020-06-05 NOTE — Progress Notes (Signed)
PROGRESS NOTE    [name]  XTG:[id number] DOB: 1961/03/28 DOA: 06/02/2020 PCP: [facility], [place]   Assessment & Plan:   Principal Problem:   ESRD on hemodialysis [facility]) Active Problems:   Anemia of chronic disease   Acute diastolic CHF (congestive heart failure) ([place])   COPD (chronic obstructive pulmonary disease) ([place])   CAP (community acquired pneumonia)   Benign essential HTN  ESRD: w/ fluid overload. Secondary to missed HD. Continue on HD as per nephro   Pneumonia: w/ fevers. Procal is 20.1. Blood cxs ordered but pt initially refused but was finally agreed to get blood cxs  06/04/20 which shows NGTD. Will continue IV azithromycin, ceftriaxone. Legionella, strep are ordered but not collected. Mycoplasma is pending. COVID19 neg   Acute hypoxic respiratory failure: w/ dyspnea. CTA chest ordered but unable to get IV access for IV contrast so V/Q scan ordered instead to r/o pulmonary emboli. Continue on supplemental oxygen and wean as tolerate d  ACD: secondary to ESRD. Continue on epo. H&H are trending down today. Will continue to monitor   Thrombocytopenia: etiology unclear. Will continue to monitor   COPD: w/o exacerbation. Continue on bronchodilators and encourage incentive spirometry   HTN emergency: likely secondary to missed HD. Continue on HD as per nephro. Will continue on home dose of coreg, lasix. IV hydralazine prn  Acute on chronic diastolic CHF: likely secondary to missed HD. Continue on HD. Continue on dose of lasix   HLD: continue on atorvastatin   DVT prophylaxis: heparin  Code Status: full  Family Communication:  Disposition Plan: likely d/c back home w/ home health  Status is: Inpatient  Remains inpatient appropriate because:IV treatments appropriate due to intensity of illness or inability to take PO, V/Q scan ordered to r/o PE    Dispo: The patient is from: Home              Anticipated d/c is to: home w/ home health                Anticipated d/c date is: 3 days              Patient currently is not medically stable to d/c.   Consultants:   nephro    Procedures:    Antimicrobials:    Subjective: Pt c/o fatigue  Objective: Vitals:   06/04/20 1518 06/04/20 2021 06/05/20 0500 06/05/20 0811  BP: (!) 167/69 (!) 143/57  (!) 160/66  Pulse: 79 71  69  Resp: 20 16  18   Temp: 99.3 F (37.4 C) 98.5 F (36.9 C)    TempSrc: Oral     SpO2: 100% 91%    Weight:   82 kg   Height:        Intake/Output Summary (Last 24 hours) at 06/05/2020 0822 Last data filed at 06/05/2020 0700 Gross per 24 hour  Intake 776.94 ml  Output 2500 ml  Net -1723.06 ml   Filed Weights   06/02/20 1056 06/05/20 0500  Weight: 80.7 kg 82 kg    Examination:  General exam:Appears uncomfortable. Appears older than stated age Respiratory system: diminished breath sounds b/l. No rales  Cardiovascular system:S1 &S2 +. No rubs, gallops or clicks. Gastrointestinal system:Abdomen is nondistended, soft and nontender. Normal bowel sounds  Central nervous system:Lethargic. Moves all 4 extremities Psychiatry:Judgement and insight appear normal.Flat mood and affect.    Data Reviewed: I have personally reviewed following labs and imaging studies  CBC: Recent Labs  Lab 06/02/20 1907 06/03/20  1660 06/04/20 0658  WBC 5.4 8.1 3.3*  NEUTROABS 2.0  --   --   HGB 8.9* 8.0* 7.7*  HCT 26.5* 24.9* 23.8*  MCV 88.6 90.2 89.1  PLT 130* 118* 71*   Basic Metabolic Panel: Recent Labs  Lab 06/02/20 1907 06/03/20 0744 06/04/20 0658  NA 137  --  136  K 4.2  --  3.7  CL 98  --  97*  CO2 28  --  30  GLUCOSE 132*  --  98  BUN 25*  --  17  CREATININE 6.68* 4.56* 4.80*  CALCIUM 8.7*  --  7.8*   GFR: Estimated Creatinine Clearance: 13 mL/min (A) (by C-G formula based on SCr of 4.8 mg/dL (H)). Liver Function Tests: No results for input(s): AST, ALT, ALKPHOS, BILITOT, PROT, ALBUMIN in the last 168 hours. No results for input(s):  LIPASE, AMYLASE in the last 168 hours. No results for input(s): AMMONIA in the last 168 hours. Coagulation Profile: No results for input(s): INR, PROTIME in the last 168 hours. Cardiac Enzymes: No results for input(s): CKTOTAL, CKMB, CKMBINDEX, TROPONINI in the last 168 hours. BNP (last 3 results) No results for input(s): PROBNP in the last 8760 hours. HbA1C: No results for input(s): HGBA1C in the last 72 hours. CBG: Recent Labs  Lab 06/04/20 1642 06/04/20 2025 06/05/20 0030 06/05/20 0503 06/05/20 0749  GLUCAP 74 90 118* 154* 171*   Lipid Profile: No results for input(s): CHOL, HDL, LDLCALC, TRIG, CHOLHDL, LDLDIRECT in the last 72 hours. Thyroid Function Tests: No results for input(s): TSH, T4TOTAL, FREET4, T3FREE, THYROIDAB in the last 72 hours. Anemia Panel: No results for input(s): VITAMINB12, FOLATE, FERRITIN, TIBC, IRON, RETICCTPCT in the last 72 hours. Sepsis Labs: Recent Labs  Lab 06/03/20 1404  PROCALCITON 20.16    Recent Results (from the past 240 hour(s))  SARS Coronavirus 2 by RT PCR (hospital order, performed in [facility] hospital lab) Nasopharyngeal Nasopharyngeal Swab     Status: None   Collection Time: 06/03/20  6:49 AM   Specimen: Nasopharyngeal Swab  Result Value Ref Range Status   SARS Coronavirus 2 NEGATIVE NEGATIVE Final    Comment: (NOTE) SARS-CoV-2 target nucleic acids are NOT DETECTED.  The SARS-CoV-2 RNA is generally detectable in upper and lower respiratory specimens during the acute phase of infection. The lowest concentration of SARS-CoV-2 viral copies this assay can detect is 250 copies / mL. A negative result does not preclude SARS-CoV-2 infection and should not be used as the sole basis for treatment or other patient management decisions.  A negative result may occur with improper specimen collection / handling, submission of specimen other than nasopharyngeal swab, presence of viral mutation(s) within the areas targeted by this assay,  and inadequate number of viral copies (<250 copies / mL). A negative result must be combined with clinical observations, patient history, and epidemiological information.  Fact Sheet for Patients:   StrictlyIdeas.no  Fact Sheet for Healthcare Providers: BankingDealers.co.za  This test is not yet approved or  cleared by the [place] FDA and has been authorized for detection and/or diagnosis of SARS-CoV-2 by FDA under an Emergency Use Authorization (EUA).  This EUA will remain in effect (meaning this test can be used) for the duration of the COVID-19 declaration under Section 564(b)(1) of the Act, 21 U.S.C. section 360bbb-3(b)(1), unless the authorization is terminated or revoked sooner.  Performed at [facility], [address] [place], [place] [address]   Gastrointestinal Panel by PCR , Stool  Status: None   Collection Time: 06/04/20  7:39 PM   Specimen: Stool  Result Value Ref Range Status   Campylobacter species NOT DETECTED NOT DETECTED Final   Plesimonas shigelloides NOT DETECTED NOT DETECTED Final   Salmonella species NOT DETECTED NOT DETECTED Final   Yersinia enterocolitica NOT DETECTED NOT DETECTED Final   Vibrio species NOT DETECTED NOT DETECTED Final   Vibrio cholerae NOT DETECTED NOT DETECTED Final   Enteroaggregative E coli (EAEC) NOT DETECTED NOT DETECTED Final   Enteropathogenic E coli (EPEC) NOT DETECTED NOT DETECTED Final   Enterotoxigenic E coli (ETEC) NOT DETECTED NOT DETECTED Final   Shiga like toxin producing E coli (STEC) NOT DETECTED NOT DETECTED Final   Shigella/Enteroinvasive E coli (EIEC) NOT DETECTED NOT DETECTED Final   Cryptosporidium NOT DETECTED NOT DETECTED Final   Cyclospora cayetanensis NOT DETECTED NOT DETECTED Final   Entamoeba histolytica NOT DETECTED NOT DETECTED Final   Giardia lamblia NOT DETECTED NOT DETECTED Final   Adenovirus F40/41 NOT DETECTED NOT DETECTED Final    Astrovirus NOT DETECTED NOT DETECTED Final   Norovirus GI/GII NOT DETECTED NOT DETECTED Final   Rotavirus A NOT DETECTED NOT DETECTED Final   Sapovirus (I, II, IV, and V) NOT DETECTED NOT DETECTED Final    Comment: Performed at [facility], [address] [place], [place] [address]  Culture, blood (Routine X 2) w Reflex to ID Panel     Status: None (Preliminary result)   Collection Time: 06/04/20  8:49 PM   Specimen: BLOOD  Result Value Ref Range Status   Specimen Description BLOOD BLOOD LEFT FOREARM  Final   Special Requests   Final    BOTTLES DRAWN AEROBIC AND ANAEROBIC Blood Culture adequate volume   Culture   Final    NO GROWTH < 12 HOURS Performed at [facility], [address] [place],  [address]    Report Status PENDING  Incomplete         Radiology Studies: No results found.      Scheduled Meds: . atorvastatin  20 mg Oral Daily  . carvedilol  6.25 mg Oral BID WC  . Chlorhexidine Gluconate Cloth  6 each Topical Daily  . epoetin (EPOGEN/PROCRIT) injection  10,000 Units Intravenous Q M,W,F-HD  . furosemide  80 mg Oral Daily  . heparin  5,000 Units Subcutaneous Q8H  . lipase/protease/amylase  12,000 Units Oral TID WC  .  morphine injection  4 mg Intravenous Once  . sevelamer carbonate  800 mg Oral TID WC   Continuous Infusions: . azithromycin Stopped (06/04/20 1655)  . cefTRIAXone (ROCEPHIN)  IV Stopped (06/04/20 1535)  . dextrose 50 mL/hr at 06/05/20 0700     LOS: 2 days    Time spent: 30 mins     Wyvonnia Dusky, MD [facility] Pager [phone]  If 7PM-7AM, please contact night-coverage [email or web address] 06/05/2020, 8:22 AM

## 2020-06-05 NOTE — Progress Notes (Signed)
Rapid response RN did first recheck with patient and patient's nurse post rapid response. Patient's nurse stated that patient was doing better post rapid. Patient appears to be resting comfortably and aroused easily to voice with sustained eye contact. Stated that she did not need anything right now. Patient now on telemetry monitor showing heart rate of 66 SR and RR of 12 unlabored.

## 2020-06-05 NOTE — Care Plan (Signed)
Rec'd call that RR was needed in this patients room. Writer was in another room cleaning a patient up. Pt lethargic upon this writers entry, easily arousable, sweating at this time. Bed sheets soaked. Pt earlier received dilaudid 1mg  per order. Pt given Narcan 0.4mg  per order. RR at bedside. Pt wakened after 46 seconds screaming god help me. Pt cleansed at this time from multiple BM and urine. Sheets and linen changed. VSS at this time. Will continue to monitor. KLB, RN

## 2020-06-05 NOTE — Progress Notes (Signed)
PT Cancellation Note  Patient Details Name: SHANEN NORRIS MRN: [id number] DOB: May 06, 1961   Cancelled Treatment:    Reason Eval/Treat Not Completed: Patient not medically ready. Patient had rapid response called just prior to 12:00. Will hold for now and follow up with tomorrow.    Conetta,Kristyn 06/05/2020, 12:41 PM

## 2020-06-05 NOTE — Progress Notes (Signed)
   06/05/20 0811  Charting Type  Charting Type Shift assessment  Orders Chart Check (once per shift) Completed  [place] Work Intensity Score (Update with each assessment and as needed)  Work Intensity Score (Level) 3  Level 3 Intensity B.Frequent assistance requests (patient or family);C.High risk for fall/injury  NOT following commands;D.Complex communication/cognitive functioning/emotional needs impeding care  Neurological  Neuro (WDL) WDL  Orientation Level Oriented X4  NuDESC - Delirium Risk Factor Assessment (Complete for non-ICU patients)  Delirium Risk Factor Assessment No risk factors identified - Delirium assessment complete  HEENT  HEENT (WDL) X  Teeth Missing (Comment)  Respiratory  Respiratory (WDL) X  Cough None  Bilateral Breath Sounds Diminished  R Upper  Breath Sounds Diminished  L Upper Breath Sounds Diminished  R Lower Breath Sounds Diminished  L Lower Breath Sounds Diminished  Cardiac  Cardiac (WDL) WDL  Braden Scale (Ages 8 and up)  Sensory Perceptions 4  Moisture 4  Activity 3  Mobility 3  Nutrition 3  Friction and Shear 3  Braden Scale Score 20  Musculoskeletal  Musculoskeletal (WDL) X  Generalized Weakness Yes  Gastrointestinal  Gastrointestinal (WDL) WDL  Last BM Date 06/05/20  GU Assessment  Genitourinary (WDL) X  Genitourinary Symptoms Anuria  Neurological  Level of Consciousness Alert  Assessment limited as patient did not want nurse to touch her. Pain medication given per her request and refused assessment.

## 2020-06-05 NOTE — Significant Event (Signed)
Rapid Response Event Note   Reason for Call :  Patient less responsive that earlier. Received IV dilaudid 1037 and percocet at 0811. Patient is a dialysis patient with chronic pain.  Initial Focused Assessment:  Rapid response team arrived in patient's room with patient lying in bed with her eyes closed. When her should was shaken or when her name called she could arouse and open her eyes. She would say a few words but was unable to stay awake. Vital signs at 11:55 BP 146/92, HR 66 and regular, oxygen saturations 89% on nasal cannula.   Interventions:  Patient got 0.4 mg of IV narcan at 12:00. After a few seconds she woke up and began screaming she was cold. Warm blankets provided to patient by staff and patient's stool cleaned up. Patient remained alert for the rest of the rapid. Recheck vital signs showed patient 100% oxygen saturations and the rest of her vitals were similar to her pre narcan vital signs.  Plan of Care:  Patient to remain on [place] for now. Reviewed narcan's medication information in relation to renal failure. Rapid response RN to check frequently on patient this afternoon.  Event Summary:   MD Notified: 1151 Call Time: [place] Time: [address] Deem RN), [place] RN End Time: [address] Jaynie Bream, RN

## 2020-06-05 NOTE — Progress Notes (Signed)
   06/05/20 1155  Vitals  BP (!) 146/70  MAP (mmHg) 92  BP Method Automatic  Pulse Rate 66  MEWS COLOR  MEWS Score Color Green  Oxygen Therapy  SpO2 (!) 89 %  MEWS Score  MEWS Temp 0  MEWS Systolic 0  MEWS Pulse 0  MEWS RR 0  MEWS LOC 0  MEWS Score 0  Event VSS prior to Narcan.

## 2020-06-05 NOTE — Care Plan (Signed)
Pt given incentive spirometer at this time. Educated patient about need and how to use it. Patient achieved 750 x 10.

## 2020-06-06 ENCOUNTER — Other Ambulatory Visit: Payer: Medicare HMO

## 2020-06-06 DIAGNOSIS — E669 Obesity, unspecified: Secondary | ICD-10-CM | POA: Diagnosis present

## 2020-06-06 DIAGNOSIS — I10 Essential (primary) hypertension: Secondary | ICD-10-CM

## 2020-06-06 DIAGNOSIS — J811 Chronic pulmonary edema: Secondary | ICD-10-CM

## 2020-06-06 LAB — CBC
HCT: 25.2 % — ABNORMAL LOW (ref 36.0–46.0)
Hemoglobin: 8.2 g/dL — ABNORMAL LOW (ref 12.0–15.0)
MCH: 29.2 pg (ref 26.0–34.0)
MCHC: 32.5 g/dL (ref 30.0–36.0)
MCV: 89.7 fL (ref 80.0–100.0)
Platelets: 78 10*3/uL — ABNORMAL LOW (ref 150–400)
RBC: 2.81 MIL/uL — ABNORMAL LOW (ref 3.87–5.11)
RDW: 15.9 % — ABNORMAL HIGH (ref 11.5–15.5)
WBC: 5.7 10*3/uL (ref 4.0–10.5)
nRBC: 0 % (ref 0.0–0.2)

## 2020-06-06 LAB — PROCALCITONIN: Procalcitonin: 22.77 ng/mL

## 2020-06-06 LAB — BLOOD CULTURE ID PANEL (REFLEXED) - BCID2
A.calcoaceticus-baumannii: NOT DETECTED
Bacteroides fragilis: NOT DETECTED
Candida albicans: NOT DETECTED
Candida auris: NOT DETECTED
Candida glabrata: NOT DETECTED
Candida krusei: NOT DETECTED
Candida parapsilosis: NOT DETECTED
Candida tropicalis: NOT DETECTED
Cryptococcus neoformans/gattii: NOT DETECTED
Enterobacter cloacae complex: NOT DETECTED
Enterobacterales: NOT DETECTED
Enterococcus Faecium: NOT DETECTED
Enterococcus faecalis: DETECTED — AB
Escherichia coli: NOT DETECTED
Haemophilus influenzae: NOT DETECTED
Klebsiella aerogenes: NOT DETECTED
Klebsiella oxytoca: NOT DETECTED
Klebsiella pneumoniae: NOT DETECTED
Listeria monocytogenes: NOT DETECTED
Neisseria meningitidis: NOT DETECTED
Proteus species: NOT DETECTED
Pseudomonas aeruginosa: NOT DETECTED
Salmonella species: NOT DETECTED
Serratia marcescens: NOT DETECTED
Staphylococcus aureus (BCID): NOT DETECTED
Staphylococcus epidermidis: NOT DETECTED
Staphylococcus lugdunensis: NOT DETECTED
Staphylococcus species: NOT DETECTED
Stenotrophomonas maltophilia: NOT DETECTED
Streptococcus agalactiae: NOT DETECTED
Streptococcus pneumoniae: NOT DETECTED
Streptococcus pyogenes: NOT DETECTED
Streptococcus species: NOT DETECTED
Vancomycin resistance: NOT DETECTED

## 2020-06-06 LAB — GLUCOSE, CAPILLARY
Glucose-Capillary: 141 mg/dL — ABNORMAL HIGH (ref 70–99)
Glucose-Capillary: 170 mg/dL — ABNORMAL HIGH (ref 70–99)
Glucose-Capillary: 192 mg/dL — ABNORMAL HIGH (ref 70–99)
Glucose-Capillary: 197 mg/dL — ABNORMAL HIGH (ref 70–99)
Glucose-Capillary: 219 mg/dL — ABNORMAL HIGH (ref 70–99)
Glucose-Capillary: 236 mg/dL — ABNORMAL HIGH (ref 70–99)

## 2020-06-06 LAB — BASIC METABOLIC PANEL
Anion gap: 11 (ref 5–15)
BUN: 18 mg/dL (ref 8–23)
CO2: 27 mmol/L (ref 22–32)
Calcium: 7.8 mg/dL — ABNORMAL LOW (ref 8.9–10.3)
Chloride: 93 mmol/L — ABNORMAL LOW (ref 98–111)
Creatinine, Ser: 5.03 mg/dL — ABNORMAL HIGH (ref 0.44–1.00)
GFR calc Af Amer: 10 mL/min — ABNORMAL LOW (ref >60)
GFR calc non Af Amer: 9 mL/min — ABNORMAL LOW (ref >60)
Glucose, Bld: 239 mg/dL — ABNORMAL HIGH (ref 70–99)
Potassium: 3.7 mmol/L (ref 3.5–5.1)
Sodium: 131 mmol/L — ABNORMAL LOW (ref 135–145)

## 2020-06-06 MED ORDER — PIPERACILLIN-TAZOBACTAM IN DEX 2-0.25 GM/50ML IV SOLN
2.2500 g | Freq: Three times a day (TID) | INTRAVENOUS | Status: DC
  Administered 2020-06-06 – 2020-06-07 (×3): 2.25 g via INTRAVENOUS
  Filled 2020-06-06 (×5): qty 50

## 2020-06-06 NOTE — Treatment Plan (Signed)
Patient to room via bed at this time. This Probation officer had to call for report. Pt aggravated stating she wants to go home. MD notified at this time.

## 2020-06-06 NOTE — TOC Initial Note (Addendum)
Transition of Care [facility]) - Initial/Assessment Note    Patient Details  Name: [name] MRN: [id number] Date of Birth: June 04, 1961  Transition [facility]) CM/SW Contact:    Beverly Sessions, RN Phone Number: 06/06/2020, 9:50 AM  Clinical Narrative:                    Patient admitted from home with ESRD: w/ fluid overload Patient lives at home alone  [facility] for transportation to appointments and HD. Patient denies issues with transportation.  [facility] - denies issues obtaining [facility]  Patient states that she has a RW, cane, inhaler, and nebulizer in the home  Patient has been to SNF in the past.  States that she has had home health in the past but does not recall the agency.    PT has assessed the patient and recommends home health PT.  Patient agreeable and states that she does not have a preference of agency     Referral made and accepted by [place] from [facility] for RN, PT, OT, aide    Patient Goals and CMS Choice        Expected Discharge Plan and Services                                                Prior Living Arrangements/Services                       Activities of Daily Living Home Assistive Devices/Equipment: Gilford Rile (specify type) ADL Screening (condition at time of admission) Patient's cognitive ability adequate to safely complete daily activities?: Yes Is the patient deaf or have difficulty hearing?: Yes Does the patient have difficulty seeing, even when wearing glasses/contacts?: No Does the patient have difficulty concentrating, remembering, or making decisions?: No Patient able to express need for assistance with ADLs?: Yes Does the patient have difficulty dressing or bathing?: No Independently performs ADLs?: Yes (appropriate for developmental age) Does the patient have difficulty walking or climbing stairs?: Yes Weakness of Legs: None Weakness of Arms/Hands: None  Permission Sought/Granted                   Emotional Assessment              Admission diagnosis:  ESRD (end stage renal disease) ([place]) [N18.6] CAP (community acquired pneumonia) [J18.9] ESRD on dialysis ([place]) [N18.6, Z99.2] Dyspnea, unspecified type [R06.00] Acute on chronic congestive heart failure, unspecified heart failure type ([place]) [I50.9] Patient Active Problem List   Diagnosis Date Noted  . Anemia 05/31/2020  . Type 1 diabetes mellitus ([place]) 05/31/2020  . End-stage renal disease ([place]) 05/31/2020  . Hypertensive disorder 05/31/2020  . Arm pain 05/21/2020  . Cellulitis of right leg 05/02/2020  . ESRD (end stage renal disease) ([place]) 04/13/2020  . SOBOE (shortness of breath on exertion) 02/08/2020  . Hematoma of arm, left, subsequent encounter 11/24/2019  . Respiratory failure ([place])   . Shortness of breath   . Complication of arteriovenous dialysis fistula 11/13/2019  . PVD (peripheral vascular disease) ([place]) 11/13/2019  . Severe anemia 11/13/2019  . Pressure injury of skin 11/13/2019  . ESRD on hemodialysis ([place])   . Hemorrhagic shock ([place]) 11/11/2019  . Leg pain 10/02/2019  . CAP (community acquired pneumonia) 10/01/2019  . Atherosclerosis of native arteries of extremity with intermittent  claudication ([place]) 05/01/2019  . Right-sided headache   . COPD (chronic obstructive pulmonary disease) ([place]) 10/06/2018  . Syncope 10/04/2018  . Symptomatic anemia 06/18/2018  . Gastroparesis due to DM ([place]) 01/18/2018  . Complication of vascular access for dialysis 12/04/2017  . Osteomyelitis ([place]) 09/30/2017  . Carotid stenosis 06/18/2017  . Bilateral carotid artery stenosis 06/16/2017  . Benign essential HTN 05/19/2017  . CKD (chronic kidney disease) stage 5, GFR less than 15 ml/min (HCC) 05/19/2017  . Hyperlipidemia, mixed 05/19/2017  . Shortness of breath 05/04/2017  . Rash, skin 12/16/2016  . Cellulitis of lower extremity 07/29/2016  . Chronic venous insufficiency 07/29/2016  . Lymphedema 07/29/2016  . TIA  (transient ischemic attack) 04/21/2016  . Altered mental status 04/08/2016  . Hyperammonemia ([place]) 04/08/2016  . Elevated troponin 04/08/2016  . Depression 04/08/2016  . Depression, major, recurrent, severe with psychosis ([place]) 04/08/2016  . Blood in stool   . Intractable cyclical vomiting with nausea   . Reflux esophagitis   . Gastritis   . Generalized abdominal pain   . Uncontrollable vomiting   . Major depressive disorder, recurrent episode, moderate ([place]) 03/15/2016  . Adjustment disorder with mixed anxiety and depressed mood 03/15/2016  . Malnutrition of moderate degree 12/01/2015  . Renal mass   . Dyspnea   . Acute renal failure ([place])   . Respiratory failure ([place])   . High temperature 11/14/2015  . Encounter for central line placement   . Encounter for orogastric (OG) tube placement   . Nausea 11/12/2015  . Hyperkalemia 10/03/2015  . Diarrhea, unspecified 07/22/2015  . Pneumonia 05/21/2015  . Hypoglycemia 04/24/2015  . Unresponsiveness 04/24/2015  . Bradycardia 04/24/2015  . Hypothermia 04/24/2015  . Acute respiratory failure ([place]) 04/24/2015  . Acute diastolic CHF (congestive heart failure) ([place]) 04/05/2015  . Diabetic gastroparesis ([place]) 04/05/2015  . Hypokalemia 04/05/2015  . Generalized weakness 04/05/2015  . Acute pulmonary edema ([place]) 04/03/2015  . Nausea and vomiting 04/03/2015  . Hypoglycemia associated with diabetes ([place]) 04/03/2015  . Anemia of chronic disease 04/03/2015  . Secondary hyperparathyroidism ([place]) 04/03/2015  . Pressure ulcer 04/02/2015  . Acute respiratory failure with hypoxia ([place]) 04/01/2015  . Adjustment disorder with anxiety 03/14/2015  . Somatic symptom disorder, mild 03/08/2015  . Coronary artery disease involving native coronary artery of native heart without angina pectoris   . Nausea & vomiting 03/06/2015  . Abdominal pain 03/06/2015  . DM (diabetes mellitus) ([place]) 03/06/2015  . HTN (hypertension) 03/06/2015  . Gastroparesis  02/24/2015  . Pleural effusion 02/19/2015  . HCAP (healthcare-associated pneumonia) 02/19/2015  . End-stage renal disease on hemodialysis ([place]) 02/19/2015  . Diarrhea 08/01/2014   PCP:  [place]:   [id number] [place] [address] [address] Phone: [phone] Fax: [phone]     Social Determinants of Health (SDOH) Interventions    Readmission Risk Interventions Readmission Risk Prevention Plan 06/06/2020 05/03/2020  Transportation Screening Complete Complete  Medication Review Press photographer) Complete Complete  PCP or Specialist appointment within 3-5 days of discharge Complete Complete  HRI or Home Care Consult Complete -  SW Recovery Care/Counseling Consult - Complete  Palliative Care Screening Not Applicable Not [place] Not Applicable Not Applicable  Some recent data might be hidden

## 2020-06-06 NOTE — Progress Notes (Signed)
[name] MRN: [id number] DOB: 26-Sep-1960   Cancelled Treatment:    Reason Eval/Treat Not Completed: Patient at procedure or test/unavailable. Patient is at dialysis right now. Will re-attempt later.     Imanol Bihl 06/06/2020, 10:58 AM

## 2020-06-06 NOTE — Progress Notes (Signed)
PHARMACY - PHYSICIAN COMMUNICATION CRITICAL VALUE ALERT - BLOOD CULTURE IDENTIFICATION (BCID)  [name] is an 59 y.o. female who presented to [facility] on 06/02/2020 with a chief complaint of chills, vomiting, shortness of breath and chest pain   Assessment: Positive Blood CUlture 1/4 bottles anaerobic GPC Enterococccus Faecalis - Van A/B not detected     (include suspected source if known)  Name of physician (or Provider) Contacted: Dr [place] Pink  Current antibiotics: Zosyn 2.25g q8h  Changes to prescribed antibiotics recommended:  Ampicillin 2g q8h pending ID consult  Results for orders placed or performed during the hospital encounter of 06/02/20  Blood Culture ID Panel (Reflexed) (Collected: 06/04/2020  8:49 PM)  Result Value Ref Range   Enterococcus faecalis DETECTED (A) NOT DETECTED   Enterococcus Faecium NOT DETECTED NOT DETECTED   Listeria monocytogenes NOT DETECTED NOT DETECTED   Staphylococcus species NOT DETECTED NOT DETECTED   Staphylococcus aureus (BCID) NOT DETECTED NOT DETECTED   Staphylococcus epidermidis NOT DETECTED NOT DETECTED   Staphylococcus lugdunensis NOT DETECTED NOT DETECTED   Streptococcus species NOT DETECTED NOT DETECTED   Streptococcus agalactiae NOT DETECTED NOT DETECTED   Streptococcus pneumoniae NOT DETECTED NOT DETECTED   Streptococcus pyogenes NOT DETECTED NOT DETECTED   A.calcoaceticus-baumannii NOT DETECTED NOT DETECTED   Bacteroides fragilis NOT DETECTED NOT DETECTED   Enterobacterales NOT DETECTED NOT DETECTED   Enterobacter cloacae complex NOT DETECTED NOT DETECTED   Escherichia coli NOT DETECTED NOT DETECTED   Klebsiella aerogenes NOT DETECTED NOT DETECTED   Klebsiella oxytoca NOT DETECTED NOT DETECTED   Klebsiella pneumoniae NOT DETECTED NOT DETECTED   Proteus species NOT DETECTED NOT DETECTED   Salmonella species NOT DETECTED NOT DETECTED   Serratia marcescens NOT DETECTED NOT DETECTED   Haemophilus influenzae NOT DETECTED NOT  DETECTED   Neisseria meningitidis NOT DETECTED NOT DETECTED   Pseudomonas aeruginosa NOT DETECTED NOT DETECTED   Stenotrophomonas maltophilia NOT DETECTED NOT DETECTED   Candida albicans NOT DETECTED NOT DETECTED   Candida auris NOT DETECTED NOT DETECTED   Candida glabrata NOT DETECTED NOT DETECTED   Candida krusei NOT DETECTED NOT DETECTED   Candida parapsilosis NOT DETECTED NOT DETECTED   Candida tropicalis NOT DETECTED NOT DETECTED   Cryptococcus neoformans/gattii NOT DETECTED NOT DETECTED   Vancomycin resistance NOT DETECTED NOT [place], PharmD, BCPS Clinical Pharmacist 06/06/2020 6:06 PM

## 2020-06-06 NOTE — Consult Note (Signed)
Pharmacy Antibiotic Note  [name] is a 59 y.o. female with past medical history for ESRD on HD, HTN, DM, CAD, COPD, PVD  admitted on 06/02/2020 with respiratory failure secondary to acute diastolic CHF and HCAP and COPD exacerbation .  Pharmacy has been consulted for Zosyn dosing due to lack of improvement on her current antibiotic regimen.  Plan:   Start Zosyn 2.25 grams IV q8h   Height: 5\' 5"  (165.1 cm) Weight: 89.6 kg (197 lb 8.5 oz) IBW/kg (Calculated) : 57  Temp (24hrs), Avg:98.6 F (37 C), Min:98.6 F (37 C), Max:98.6 F (37 C)  Recent Labs  Lab 06/02/20 1907 06/03/20 0744 06/04/20 0658 06/05/20 1325 06/06/20 0612  WBC 5.4 8.1 3.3* 8.3 5.7  CREATININE 6.68* 4.56* 4.80* 4.30* 5.03*    Estimated Creatinine Clearance: 13 mL/min (A) (by C-G formula based on SCr of 5.03 mg/dL (H)).    Antimicrobials this admission: azithromycin 9/12 >> 9/15 ceftriaxone 9/12 >> 9/15 Zosyn 9/15 >>  Microbiology results: 9/14 BCx: NG<24h 9/13 GI panel: negative 9/12 SARS CoV-2: negative   Thank you for allowing pharmacy to be a part of this patient's care.  Dallie Piles 06/06/2020 1:55 PM

## 2020-06-06 NOTE — Progress Notes (Signed)
Physical Therapy Treatment Patient Details Name: [name] MRN: [id number] DOB: 1961/05/05 Today's Date: 06/06/2020    History of Present Illness [name] is a 59 y.o. female with medical history significant of end-stage renal disease on hemodialysis Mondays Wednesdays and Fridays, hypertension, COPD, diastolic dysfunction CHF, depression, diabetes, hyperlipidemia, who apparently missed her dialysis on Friday.  Came to the ER with complaining of significant shortness of breath and cough.    PT Comments    Patient received in bed, reports she wants to go home today. States she has not been allowed to get up at all since she has been here. Multiple complaints throughout session. Argumentative about any suggestions. Not cooperative when asking patient to do certain things like scoot back in the recliner, ignoring me. She is mod independent with bed mobility, transfers with supervision and ambulated with rw 30 feet in room. Slow pace, reports she cant believe she feels this bad and reports some nausea after mobility. Patient left in recliner with chair alarm on. She will continue to benefit from skilled PT while here if more cooperative to improve strength and activity tolerance.      Follow Up Recommendations  Home health PT     Equipment Recommendations  None recommended by PT    Recommendations for Other Services       Precautions / Restrictions Precautions Precautions: Fall Restrictions Weight Bearing Restrictions: No    Mobility  Bed Mobility Overal bed mobility: Modified Independent Bed Mobility: Supine to Sit     Supine to sit: Modified independent (Device/Increase time)     General bed mobility comments: increased time  Transfers Overall transfer level: Needs assistance Equipment used: Rolling walker (2 wheeled)             General transfer comment: sit to stand with supervision  Ambulation/Gait Ambulation/Gait assistance: Min guard;Supervision Gait  Distance (Feet): 30 Feet Assistive device: Rolling walker (2 wheeled) Gait Pattern/deviations: Step-through pattern Gait velocity: very slow   General Gait Details: patient generally safe, stood at sink to wash up, would not allow assistance at all other than retrieving supplies.   Stairs             Wheelchair Mobility    Modified Rankin (Stroke Patients Only)       Balance Overall balance assessment: Modified Independent                                          Cognition Arousal/Alertness: Awake/alert Behavior During Therapy: Agitated                                   General Comments: patient with various complaints from the moment I entered room. Not cooperative with my questions or requests.      Exercises      General Comments        Pertinent Vitals/Pain Pain Assessment: No/denies pain    Home Living                      Prior Function            PT Goals (current goals can now be found in the care plan section) Acute Rehab PT Goals Patient Stated Goal: to go home today PT Goal Formulation: With patient Time For Goal Achievement: 06/18/20  Potential to Achieve Goals: Good Progress towards PT goals: Progressing toward goals    Frequency    Min 2X/week      PT Plan Current plan remains appropriate    Co-evaluation              AM-PAC PT "6 Clicks" Mobility   Outcome Measure  Help needed turning from your back to your side while in a flat bed without using bedrails?: None Help needed moving from lying on your back to sitting on the side of a flat bed without using bedrails?: None Help needed moving to and from a bed to a chair (including a wheelchair)?: A Little Help needed standing up from a chair using your arms (e.g., wheelchair or bedside chair)?: A Little Help needed to walk in hospital room?: A Little Help needed climbing 3-5 steps with a railing? : A Lot 6 Click Score: 19     End of Session   Activity Tolerance: Patient tolerated treatment well Patient left: in chair;with call bell/phone within reach;with chair alarm set Nurse Communication: Mobility status;Other (comment) (patient unpleasant. Unwilling to answer me at times, not following direction. Demanding.) PT Visit Diagnosis: Muscle weakness (generalized) (M62.81)     Time: 8288-3374 PT Time Calculation (min) (ACUTE ONLY): 35 min  Charges:  $Gait Training: 8-22 mins $Therapeutic Activity: 8-22 mins                     [employer], PT, [employer] 06/06/20,4:20 PM

## 2020-06-06 NOTE — Care Management Important Message (Signed)
Important Message  Patient Details  Name: [name] MRN: [id number] Date of Birth: 1961-07-02   Medicare Important Message Given:  Yes  Initial Medicare IM given by Patient Access Associate on 06/05/2020 at 2:40pm.     Dannette Barbara 06/06/2020, 11:27 AM

## 2020-06-06 NOTE — Progress Notes (Signed)
[facility]  ROUNDING NOTE   Subjective:  Patient had VQ scan performed yesterday.  This was low probability for pulmonary embolism. Due for dialysis treatment today.   Objective:  Vital signs in last 24 hours:  Temp:  [97.9 F (36.6 C)-98.6 F (37 C)] 98.6 F (37 C) (09/14 2051) Pulse Rate:  [56-72] 56 (09/14 2143) Resp:  [16-20] 16 (09/14 2051) BP: (68-171)/(45-87) 165/55 (09/15 0814) SpO2:  [89 %-100 %] 100 % (09/14 2143) Weight:  [89.6 kg] 89.6 kg (09/15 0800)  Weight change:  Filed Weights   06/02/20 1056 06/05/20 0500 06/06/20 0800  Weight: 80.7 kg 82 kg 89.6 kg    Intake/Output: I/O last 3 completed shifts: In: 1932.2 [I.V.:1232.2; IV ONGEXBMWU:132] Out: 2 [Urine:1; Stool:1]   Intake/Output this shift:  No intake/output data recorded.  Physical Exam: General:  No acute distress  Head:  Oral mucosal membranes moist  Eyes:  Anicteric  Neck:  Supple  Lungs:   Basilar rales, normal effort  Heart:  Regular, S1S2   Abdomen:   Soft, nontender, non distended  Extremities:  Trace peripheral edema.  Neurologic:  Able to follow commands  Skin:  No acute  Lesions or rashes  Access:  Right upper extremity AV access    Basic Metabolic Panel: Recent Labs  Lab 06/02/20 1907 06/02/20 1907 06/03/20 0744 06/04/20 0658 06/05/20 1325 06/06/20 0612  NA 137  --   --  136 134* 131*  K 4.2  --   --  3.7 3.8 3.7  CL 98  --   --  97* 93* 93*  CO2 28  --   --  30 27 27   GLUCOSE 132*  --   --  98 268* 239*  BUN 25*  --   --  17 15 18   CREATININE 6.68*  --  4.56* 4.80* 4.30* 5.03*  CALCIUM 8.7*   < >  --  7.8* 7.8* 7.8*   < > = values in this interval not displayed.    Liver Function Tests: No results for input(s): AST, ALT, ALKPHOS, BILITOT, PROT, ALBUMIN in the last 168 hours. No results for input(s): LIPASE, AMYLASE in the last 168 hours. No results for input(s): AMMONIA in the last 168 hours.  CBC: Recent Labs  Lab 06/02/20 1907 06/03/20 0744  06/04/20 0658 06/05/20 1325 06/06/20 0612  WBC 5.4 8.1 3.3* 8.3 5.7  NEUTROABS 2.0  --   --   --   --   HGB 8.9* 8.0* 7.7* 8.2* 8.2*  HCT 26.5* 24.9* 23.8* 27.0* 25.2*  MCV 88.6 90.2 89.1 93.8 89.7  PLT 130* 118* 71* 77* 78*    Cardiac Enzymes: No results for input(s): CKTOTAL, CKMB, CKMBINDEX, TROPONINI in the last 168 hours.  BNP: Invalid input(s): POCBNP  CBG: Recent Labs  Lab 06/05/20 1626 06/05/20 2047 06/06/20 0024 06/06/20 0509 06/06/20 0823  GLUCAP 208* 199* 197* 219* 37*    Microbiology: Results for orders placed or performed during the hospital encounter of 06/02/20  SARS Coronavirus 2 by RT PCR (hospital order, performed in [facility] hospital lab) Nasopharyngeal Nasopharyngeal Swab     Status: None   Collection Time: 06/03/20  6:49 AM   Specimen: Nasopharyngeal Swab  Result Value Ref Range Status   SARS Coronavirus 2 NEGATIVE NEGATIVE Final    Comment: (NOTE) SARS-CoV-2 target nucleic acids are NOT DETECTED.  The SARS-CoV-2 RNA is generally detectable in upper and lower respiratory specimens during the acute phase of infection. The lowest concentration of  SARS-CoV-2 viral copies this assay can detect is 250 copies / mL. A negative result does not preclude SARS-CoV-2 infection and should not be used as the sole basis for treatment or other patient management decisions.  A negative result may occur with improper specimen collection / handling, submission of specimen other than nasopharyngeal swab, presence of viral mutation(s) within the areas targeted by this assay, and inadequate number of viral copies (<250 copies / mL). A negative result must be combined with clinical observations, patient history, and epidemiological information.  Fact Sheet for Patients:   StrictlyIdeas.no  Fact Sheet for Healthcare Providers: BankingDealers.co.za  This test is not yet approved or  cleared by the [place] FDA  and has been authorized for detection and/or diagnosis of SARS-CoV-2 by FDA under an Emergency Use Authorization (EUA).  This EUA will remain in effect (meaning this test can be used) for the duration of the COVID-19 declaration under Section 564(b)(1) of the Act, 21 U.S.C. section 360bbb-3(b)(1), unless the authorization is terminated or revoked sooner.  Performed at [facility], [address] [place], [place] [address]   Gastrointestinal Panel by PCR , Stool     Status: None   Collection Time: 06/04/20  7:39 PM   Specimen: Stool  Result Value Ref Range Status   Campylobacter species NOT DETECTED NOT DETECTED Final   Plesimonas shigelloides NOT DETECTED NOT DETECTED Final   Salmonella species NOT DETECTED NOT DETECTED Final   Yersinia enterocolitica NOT DETECTED NOT DETECTED Final   Vibrio species NOT DETECTED NOT DETECTED Final   Vibrio cholerae NOT DETECTED NOT DETECTED Final   Enteroaggregative E coli (EAEC) NOT DETECTED NOT DETECTED Final   Enteropathogenic E coli (EPEC) NOT DETECTED NOT DETECTED Final   Enterotoxigenic E coli (ETEC) NOT DETECTED NOT DETECTED Final   Shiga like toxin producing E coli (STEC) NOT DETECTED NOT DETECTED Final   Shigella/Enteroinvasive E coli (EIEC) NOT DETECTED NOT DETECTED Final   Cryptosporidium NOT DETECTED NOT DETECTED Final   Cyclospora cayetanensis NOT DETECTED NOT DETECTED Final   Entamoeba histolytica NOT DETECTED NOT DETECTED Final   Giardia lamblia NOT DETECTED NOT DETECTED Final   Adenovirus F40/41 NOT DETECTED NOT DETECTED Final   Astrovirus NOT DETECTED NOT DETECTED Final   Norovirus GI/GII NOT DETECTED NOT DETECTED Final   Rotavirus A NOT DETECTED NOT DETECTED Final   Sapovirus (I, II, IV, and V) NOT DETECTED NOT DETECTED Final    Comment: Performed at [facility], [address] [place], [place] [address]  Culture, blood (Routine X 2) w Reflex to ID Panel     Status: None (Preliminary result)   Collection  Time: 06/04/20  8:49 PM   Specimen: BLOOD  Result Value Ref Range Status   Specimen Description BLOOD BLOOD LEFT FOREARM  Final   Special Requests   Final    BOTTLES DRAWN AEROBIC AND ANAEROBIC Blood Culture adequate volume   Culture   Final    NO GROWTH 2 DAYS Performed at [facility], [address] [place], [place] [address]    Report Status PENDING  Incomplete  Culture, blood (Routine X 2) w Reflex to ID Panel     Status: None (Preliminary result)   Collection Time: 06/05/20  1:25 PM   Specimen: BLOOD  Result Value Ref Range Status   Specimen Description BLOOD LAC  Final   Special Requests   Final    BOTTLES DRAWN AEROBIC AND ANAEROBIC Blood Culture adequate volume   Culture  Final    NO GROWTH < 24 HOURS Performed at [facility], [address] [place], [place] [address]    Report Status PENDING  Incomplete    Coagulation Studies: No results for input(s): LABPROT, INR in the last 72 hours.  Urinalysis: No results for input(s): COLORURINE, LABSPEC, PHURINE, GLUCOSEU, HGBUR, BILIRUBINUR, KETONESUR, PROTEINUR, UROBILINOGEN, NITRITE, LEUKOCYTESUR in the last 72 hours.  Invalid input(s): APPERANCEUR    Imaging: DG Chest 1 View  Result Date: 06/05/2020 CLINICAL DATA:  Respiratory distress, pulmonary embolism EXAM: CHEST  1 VIEW COMPARISON:  06/02/2020 FINDINGS: Lung volumes are small and there is bibasilar atelectasis. Superimposed interstitial pulmonary edema has resolved. Tiny bilateral pleural effusions have improved in the interval. No pneumothorax. Cardiac size within normal limits. Right internal jugular he row graft is seen with its tip overlying the right atrium. Vascular stents are seen within the central left upper extremity veins and visualized right upper extremity. Cardiac size within normal limits. IMPRESSION: Small lung volumes with bibasilar atelectasis. Superimposed interstitial pulmonary edema has resolved. Tiny bilateral pleural  effusions have improved in the interval. Electronically Signed   By: Fidela Salisbury MD   On: 06/05/2020 16:54   CT HEAD WO CONTRAST  Result Date: 06/05/2020 CLINICAL DATA:  59 year old female with hypotension, diaphoresis, altered mental status today. In stage renal disease, dialysis. EXAM: CT HEAD WITHOUT CONTRAST TECHNIQUE: Contiguous axial images were obtained from the base of the skull through the vertex without intravenous contrast. COMPARISON:  Head CT 05/02/2020.  Brain MRI 05/03/2019. FINDINGS: Brain: No midline shift, ventriculomegaly, mass effect, evidence of mass lesion, intracranial hemorrhage or evidence of cortically based acute infarction. Chronic lower pontine lacunar infarcts better demonstrated on MRI last year. Elsewhere gray-white matter differentiation is within normal limits throughout the brain. Vascular: Calcified atherosclerosis at the skull base. No suspicious intracranial vascular hyperdensity. Skull: Stable visualized osseous structures. Scattered small lucent areas in the calvarium (series 3, image 62 at the left vertex) have mildly increased since 2017 and are probably related to metabolic bone disease. Sinuses/Orbits: Visualized paranasal sinuses and mastoids are stable and well pneumatized. Other: No acute orbit or scalp soft tissue finding. Some scalp vessel calcified atherosclerosis is noted. IMPRESSION: 1. No acute intracranial abnormality. 2. Chronic lacunar infarcts in the pons. 3. Several slowly enlarging small lucent areas in the calvarium since 2017 are probably related to metabolic bone disease in the setting of chronic ESRD. Electronically Signed   By: Genevie Ann M.D.   On: 06/05/2020 16:28   NM Pulmonary Perfusion  Result Date: 06/05/2020 CLINICAL DATA:  Left chest pain EXAM: NUCLEAR MEDICINE PERFUSION LUNG SCAN TECHNIQUE: Perfusion images were obtained in multiple projections after intravenous injection of radiopharmaceutical. Ventilation scans intentionally deferred  if perfusion scan and chest x-ray adequate for interpretation during COVID 19 epidemic. RADIOPHARMACEUTICALS:  4.724 mCi Tc-33m MAA IV COMPARISON:  None. Findings are correlated with concurrently performed chest radiograph FINDINGS: There is no significant perfusion defect identified. IMPRESSION: Negative for pulmonary embolism Electronically Signed   By: Fidela Salisbury MD   On: 06/05/2020 16:56     Medications:   . azithromycin Stopped (06/05/20 1404)  . cefTRIAXone (ROCEPHIN)  IV Stopped (06/05/20 1442)  . dextrose 20 mL/hr at 06/06/20 0158   . atorvastatin  20 mg Oral Daily  . carvedilol  6.25 mg Oral BID WC  . Chlorhexidine Gluconate Cloth  6 each Topical Daily  . epoetin (EPOGEN/PROCRIT) injection  10,000 Units Intravenous Q M,W,F-HD  . furosemide  80 mg  Oral Daily  . heparin  5,000 Units Subcutaneous Q8H  . lipase/protease/amylase  12,000 Units Oral TID WC  .  morphine injection  4 mg Intravenous Once  . sevelamer carbonate  800 mg Oral TID WC   acetaminophen **OR** acetaminophen, calcium carbonate, camphor-menthol **AND** hydrOXYzine, feeding supplement (NEPRO CARB STEADY), hydrALAZINE, ipratropium-albuterol, loperamide, morphine injection, naLOXone (NARCAN)  injection, ondansetron **OR** ondansetron (ZOFRAN) IV, oxyCODONE-acetaminophen, promethazine, sorbitol, zolpidem  Assessment/ Plan:  59 y.o. female with past medical history of ESRD on HD MWF, hypertension, COPD, diastolic heart failure, depression, diabetes mellitus type 2, hyperlipidemia, anemia of chronic kidney disease, secondary hyperparathyroidism, new dialysis access placement 04/12/2020 who presented with missed dialysis treatment and volume overload.  [facility]/MWF3/[place]/81kg.  1.  ESRD on HD MWF/volume overload/pulmonary edema.   -Patient due for dialysis treatment today.  Orders have been prepared.  2.  Anemia of chronic kidney disease.   Hemoglobin of 8.2.  Continue Epogen 10,000 units IV with dialysis  treatments.  3.  Secondary hyperparathyroidism.  Repeat serum phosphorus today and maintain the patient on Renvela 800 mg p.o. 3 times daily.   LOS: 3 Lydian Chavous 9/15/20219:42 AM

## 2020-06-06 NOTE — Progress Notes (Signed)
   06/06/20 1200  Clinical Encounter Type  Visited With Patient not available  Visit Type Follow-up  Referral From Chaplain  Consult/Referral To Chaplain  While rounding, chaplain stopped by to see pt, but she is not in the room. Chaplain will follow up later.

## 2020-06-06 NOTE — Progress Notes (Signed)
OT Cancellation Note  Patient Details Name: [name] MRN: [id number] DOB: 07/15/1961   Cancelled Treatment:    Reason Eval/Treat Not Completed: Patient at procedure or test/ unavailable. Order received and chart reviewed. Multiple attempts made to see pt with her currently at dialysis. OT will reattempt when pt is available to participate.   Darleen Crocker, MS, OTR/L , CBIS ascom [phone]  06/06/20, 2:24 PM  06/06/2020, 2:23 PM

## 2020-06-06 NOTE — Consult Note (Signed)
NAME: [name]  DOB: [birth year]  MRN: [id number]  Date/Time: 06/06/2020 6:35 PM  REQUESTING PROVIDER: Dr. Maryland Pink Subjective:  REASON FOR CONSULT: Enterococcus bacteremia ?pt a poor historian, chart reviewed- son at bed side gave some history  [name] is a 59 y.o. female with a history of ESRD, COPD, CAD, diabetes mellitus, Presents to the ED on 06/02/2020 with shortness of breath . Marland Kitchen  Her last dialysis was on Wednesday.  She was unable to get dialysis yesterday due to diarrhea  In the ED her blood pressure 208/89 , HR 66, temperature 97.7, sats of 97%. Labs revealed WBC of 5.4, hemoglobin of 8.9, PLT of 130, She underwent urgent dialysis because of fluid overload/pulmonary edema.   She had a temperature of 101.2 the next day and blood cultures were sent. The blood culture is positive for Enterococcus in 1 bottle out of [employer] seeing the patient for the same.  Patient is currently on ampicillin. Pt has vomiting due to gastrparesis She also has intermittent diarrhea  Past medical history Diabetes mellitus, hypertension, hyperlipidemia, end-stage renal disease on dialysis, bilateral carotid artery stenosis, gastroparesis, diabetic neuropathy, COPD, CAD status post stents, acute pulmonary edema, acute diastolic CHF History of C. difficile colitis needing fecal transplant,  Past surgical history Appendectomy Cholecystectomy Fecal transplant for C. difficile colitis and December 2016 Hand surgery Hysterectomy Laser surgery/lens implants to eyes Right upper extremity graft for dialysis Amputation of the left great toe 10/02/2017 Biopsy temporal artery January 2020  Social history Former smoker 1 glass of wine per week   Family History  Problem Relation Age of Onset  . Kidney disease Mother   . Diabetes Mother   . Cancer Father   . Kidney disease Sister    Allergies  Allergen Reactions  . Ace Inhibitors Swelling and Anaphylaxis  . Ativan [Lorazepam] Other (See Comments)     Reaction:Hallucinations and headaches  . Compazine [Prochlorperazine Edisylate] Anaphylaxis, Nausea And Vomiting and Other (See Comments)    Other reaction(s): dystonia from this vs. Reglan, 23 Jul - patient relates that she takes promethazine frequently with no problems  . Sumatriptan Succinate Other (See Comments)    Other reaction(s): delirium and hallucinations per [facility] records  . Zofran [Ondansetron] Nausea And Vomiting    Per pt. she is allergic to zofran or will experience adverse reaction like hallucination   . Lac Bovis Nausea And Vomiting  . Losartan Nausea Only  . Prochlorperazine Other (See Comments)    Reaction:  Unknown . Patient does not remember reaction but she does have vertigo and anxiety along with n and v at times. Could be used to treat any of these   . Reglan [Metoclopramide] Other (See Comments)    Per patient her Dr. Evelina Bucy her off it   . Scopolamine Other (See Comments)    Dizziness, also has vertigo already  . Tape Rash    Plastic tape causes rash  . Tapentadol Rash    ? Current Facility-Administered Medications  Medication Dose Route Frequency Provider Last Rate Last Admin  . acetaminophen (TYLENOL) tablet 650 mg  650 mg Oral Q6H PRN Elwyn Reach, MD   650 mg at 06/06/20 5784   Or  . acetaminophen (TYLENOL) suppository 650 mg  650 mg Rectal Q6H PRN Elwyn Reach, MD      . atorvastatin (LIPITOR) tablet 20 mg  20 mg Oral Daily Wyvonnia Dusky, MD   20 mg at 06/06/20 0817  . calcium carbonate (TUMS -  dosed in mg elemental calcium) chewable tablet 500 mg of elemental calcium  500 mg of elemental calcium Oral Q6H PRN Elwyn Reach, MD      . camphor-menthol (SARNA) lotion 1 application  1 application Topical Z6X PRN Elwyn Reach, MD       And  . hydrOXYzine (ATARAX/VISTARIL) tablet 25 mg  25 mg Oral Q8H PRN Elwyn Reach, MD   25 mg at 06/03/20 0454  . carvedilol (COREG) tablet 6.25 mg  6.25 mg Oral BID WC Wyvonnia Dusky, MD    6.25 mg at 06/06/20 1602  . Chlorhexidine Gluconate Cloth 2 % PADS 6 each  6 each Topical Daily Sharion Settler, NP      . dextrose 10 % infusion   Intravenous Continuous Sharion Settler, NP 50 mL/hr at 06/06/20 1732 Rate Verify at 06/06/20 1732  . epoetin alfa (EPOGEN) injection 10,000 Units  10,000 Units Intravenous Q M,W,F-HD Holley Raring, Munsoor, MD   10,000 Units at 06/06/20 1328  . feeding supplement (NEPRO CARB STEADY) liquid 237 mL  237 mL Oral TID PRN Gala Romney L, MD      . furosemide (LASIX) tablet 80 mg  80 mg Oral Daily Wyvonnia Dusky, MD   80 mg at 06/06/20 0817  . heparin injection 5,000 Units  5,000 Units Subcutaneous Q8H Elwyn Reach, MD   5,000 Units at 06/06/20 0512  . hydrALAZINE (APRESOLINE) injection 10 mg  10 mg Intravenous Q4H PRN Elwyn Reach, MD   10 mg at 06/03/20 1040  . ipratropium-albuterol (DUONEB) 0.5-2.5 (3) MG/3ML nebulizer solution 3 mL  3 mL Nebulization Q4H PRN Wyvonnia Dusky, MD   3 mL at 06/06/20 1715  . lipase/protease/amylase (CREON) capsule 12,000 Units  12,000 Units Oral TID WC Wyvonnia Dusky, MD   12,000 Units at 06/06/20 1705  . loperamide (IMODIUM) capsule 2 mg  2 mg Oral PRN Sharion Settler, NP      . morphine 2 MG/ML injection 1 mg  1 mg Intravenous Q4H PRN Wyvonnia Dusky, MD      . morphine 4 MG/ML injection 4 mg  4 mg Intravenous Once Sharion Settler, NP      . naloxone [facility]) injection 0.4 mg  0.4 mg Intravenous PRN Wyvonnia Dusky, MD   0.4 mg at 06/05/20 1159  . ondansetron (ZOFRAN) tablet 4 mg  4 mg Oral Q6H PRN Elwyn Reach, MD       Or  . ondansetron (ZOFRAN) injection 4 mg  4 mg Intravenous Q6H PRN Elwyn Reach, MD   4 mg at 06/05/20 0953  . oxyCODONE-acetaminophen (PERCOCET/ROXICET) 5-325 MG per tablet 1 tablet  1 tablet Oral Q4H PRN Wyvonnia Dusky, MD   1 tablet at 06/06/20 1706  . piperacillin-tazobactam (ZOSYN) IVPB 2.25 g  2.25 g Intravenous Q8H Dallie Piles, [facility]   Stopped at  06/06/20 1632  . promethazine (PHENERGAN) injection 12.5 mg  12.5 mg Intravenous Q6H PRN Sharion Settler, NP   12.5 mg at 06/04/20 1711  . sevelamer carbonate (RENVELA) tablet 800 mg  800 mg Oral TID WC Lateef, Munsoor, MD   800 mg at 06/06/20 1705  . sorbitol 70 % solution 30 mL  30 mL Oral PRN Gala Romney L, MD      . zolpidem (AMBIEN) tablet 5 mg  5 mg Oral QHS PRN Elwyn Reach, MD         Abtx:  Anti-infectives (From admission, onward)   Start  Dose/Rate Route Frequency Ordered Stop   06/06/20 1600  piperacillin-tazobactam (ZOSYN) IVPB 2.25 g        2.25 g 100 mL/hr over 30 Minutes Intravenous Every 8 hours 06/06/20 1403     06/03/20 1400  azithromycin (ZITHROMAX) 500 mg in sodium chloride 0.9 % 250 mL IVPB  Status:  Discontinued        500 mg 250 mL/hr over 60 Minutes Intravenous Every 24 hours 06/03/20 1322 06/06/20 1319   06/03/20 1400  cefTRIAXone (ROCEPHIN) 1 g in sodium chloride 0.9 % 100 mL IVPB  Status:  Discontinued        1 g 200 mL/hr over 30 Minutes Intravenous Every 24 hours 06/03/20 1322 06/06/20 1319      REVIEW OF SYSTEMS:  NA Objective:  VITALS:  BP (!) 156/52 (BP Location: Left Arm)   Pulse 67   Temp 98.9 F (37.2 C) (Oral)   Resp 16   Ht 5\' 5"  (1.651 m)   Wt 89.6 kg   SpO2 94%   BMI 32.87 kg/m  PHYSICAL EXAM:  General: lethargic, chronically ill, nauseous, pale Head: Normocephalic, without obvious abnormality, atraumatic.alopecia Eyes: Conjunctivae clear, anicteric sclerae. Pupils are equal ENT did not examine Neck: Supple,. Back: No CVA tenderness. Lungs: b/l air entry Decreased bases  Heart: Regular rate and rhythm, no murmur, rub or gallop. Abdomen: Soft, non-tender,not distended. Bowel sounds normal. No masses Extremities: minimal ankle edema Skin: dry  Lymph: Cervical, supraclavicular normal. Neurologic: Grossly non-focal Pertinent Labs Lab Results CBC    Component Value Date/Time   WBC 5.7 06/06/2020 0612   RBC 2.81  (L) 06/06/2020 0612   HGB 8.2 (L) 06/06/2020 0612   HGB 10.5 (L) 09/15/2014 0948   HCT 25.2 (L) 06/06/2020 0612   HCT 34.2 (L) 09/15/2014 0948   PLT 78 (L) 06/06/2020 0612   PLT 203 09/15/2014 0948   MCV 89.7 06/06/2020 0612   MCV 97 09/15/2014 0948   MCH 29.2 06/06/2020 0612   MCHC 32.5 06/06/2020 0612   RDW 15.9 (H) 06/06/2020 0612   RDW 16.5 (H) 09/15/2014 0948   LYMPHSABS 1.7 06/02/2020 1907   LYMPHSABS 2.5 05/31/2014 0432   MONOABS 0.3 06/02/2020 1907   MONOABS 0.5 05/31/2014 0432   EOSABS 1.3 (H) 06/02/2020 1907   EOSABS 0.2 05/31/2014 0432   BASOSABS 0.1 06/02/2020 1907   BASOSABS 0.0 05/31/2014 0432    CMP Latest Ref Rng & Units 06/06/2020 06/05/2020 06/04/2020  Glucose 70 - 99 mg/dL 239(H) 268(H) 98  BUN 8 - 23 mg/dL 18 15 17   Creatinine 0.44 - 1.00 mg/dL 5.03(H) 4.30(H) 4.80(H)  Sodium 135 - 145 mmol/L 131(L) 134(L) 136  Potassium 3.5 - 5.1 mmol/L 3.7 3.8 3.7  Chloride 98 - 111 mmol/L 93(L) 93(L) 97(L)  CO2 22 - 32 mmol/L 27 27 30   Calcium 8.9 - 10.3 mg/dL 7.8(L) 7.8(L) 7.8(L)  Total Protein 6.5 - 8.1 g/dL - - -  Total Bilirubin 0.3 - 1.2 mg/dL - - -  Alkaline Phos 38 - 126 U/L - - -  AST 15 - 41 U/L - - -  ALT 0 - 44 U/L - - -      Microbiology: Recent Results (from the past 240 hour(s))  SARS Coronavirus 2 by RT PCR (hospital order, performed in [facility] hospital lab) Nasopharyngeal Nasopharyngeal Swab     Status: None   Collection Time: 06/03/20  6:49 AM   Specimen: Nasopharyngeal Swab  Result Value Ref Range Status   SARS Coronavirus  2 NEGATIVE NEGATIVE Final    Comment: (NOTE) SARS-CoV-2 target nucleic acids are NOT DETECTED.  The SARS-CoV-2 RNA is generally detectable in upper and lower respiratory specimens during the acute phase of infection. The lowest concentration of SARS-CoV-2 viral copies this assay can detect is 250 copies / mL. A negative result does not preclude SARS-CoV-2 infection and should not be used as the sole basis for treatment  or other patient management decisions.  A negative result may occur with improper specimen collection / handling, submission of specimen other than nasopharyngeal swab, presence of viral mutation(s) within the areas targeted by this assay, and inadequate number of viral copies (<250 copies / mL). A negative result must be combined with clinical observations, patient history, and epidemiological information.  Fact Sheet for Patients:   StrictlyIdeas.no  Fact Sheet for Healthcare Providers: BankingDealers.co.za  This test is not yet approved or  cleared by the [place] FDA and has been authorized for detection and/or diagnosis of SARS-CoV-2 by FDA under an Emergency Use Authorization (EUA).  This EUA will remain in effect (meaning this test can be used) for the duration of the COVID-19 declaration under Section 564(b)(1) of the Act, 21 U.S.C. section 360bbb-3(b)(1), unless the authorization is terminated or revoked sooner.  Performed at [facility], [address] [place], [place] [address]   Gastrointestinal Panel by PCR , Stool     Status: None   Collection Time: 06/04/20  7:39 PM   Specimen: Stool  Result Value Ref Range Status   Campylobacter species NOT DETECTED NOT DETECTED Final   Plesimonas shigelloides NOT DETECTED NOT DETECTED Final   Salmonella species NOT DETECTED NOT DETECTED Final   Yersinia enterocolitica NOT DETECTED NOT DETECTED Final   Vibrio species NOT DETECTED NOT DETECTED Final   Vibrio cholerae NOT DETECTED NOT DETECTED Final   Enteroaggregative E coli (EAEC) NOT DETECTED NOT DETECTED Final   Enteropathogenic E coli (EPEC) NOT DETECTED NOT DETECTED Final   Enterotoxigenic E coli (ETEC) NOT DETECTED NOT DETECTED Final   Shiga like toxin producing E coli (STEC) NOT DETECTED NOT DETECTED Final   Shigella/Enteroinvasive E coli (EIEC) NOT DETECTED NOT DETECTED Final   Cryptosporidium NOT DETECTED  NOT DETECTED Final   Cyclospora cayetanensis NOT DETECTED NOT DETECTED Final   Entamoeba histolytica NOT DETECTED NOT DETECTED Final   Giardia lamblia NOT DETECTED NOT DETECTED Final   Adenovirus F40/41 NOT DETECTED NOT DETECTED Final   Astrovirus NOT DETECTED NOT DETECTED Final   Norovirus GI/GII NOT DETECTED NOT DETECTED Final   Rotavirus A NOT DETECTED NOT DETECTED Final   Sapovirus (I, II, IV, and V) NOT DETECTED NOT DETECTED Final    Comment: Performed at [facility], [name] [place], [place] [address]  Culture, blood (Routine X 2) w Reflex to ID Panel     Status: None (Preliminary result)   Collection Time: 06/04/20  8:49 PM   Specimen: BLOOD  Result Value Ref Range Status   Specimen Description BLOOD BLOOD LEFT FOREARM  Final   Special Requests   Final    BOTTLES DRAWN AEROBIC AND ANAEROBIC Blood Culture adequate volume   Culture  Setup Time   Final    ANAEROBIC BOTTLE ONLY GRAM POSITIVE COCCI Organism ID to follow CRITICAL RESULT CALLED TO, READ BACK BY AND VERIFIED WITH: CHARLES SHANLEVER @1636  06/06/20 MJU Performed at [facility], [address] [place], [place] [address]    Culture [facility] POSITIVE COCCI  Final   Report  Status PENDING  Incomplete  Blood Culture ID Panel (Reflexed)     Status: Abnormal   Collection Time: 06/04/20  8:49 PM  Result Value Ref Range Status   Enterococcus faecalis DETECTED (A) NOT DETECTED Final    Comment: CRITICAL RESULT CALLED TO, READ BACK BY AND VERIFIED WITH: CHARLES SHANLEVER @1636  06/06/20 MJU    Enterococcus Faecium NOT DETECTED NOT DETECTED Final   Listeria monocytogenes NOT DETECTED NOT DETECTED Final   Staphylococcus species NOT DETECTED NOT DETECTED Final   Staphylococcus aureus (BCID) NOT DETECTED NOT DETECTED Final   Staphylococcus epidermidis NOT DETECTED NOT DETECTED Final   Staphylococcus lugdunensis NOT DETECTED NOT DETECTED Final   Streptococcus species NOT DETECTED NOT DETECTED Final    Streptococcus agalactiae NOT DETECTED NOT DETECTED Final   Streptococcus pneumoniae NOT DETECTED NOT DETECTED Final   Streptococcus pyogenes NOT DETECTED NOT DETECTED Final   A.calcoaceticus-baumannii NOT DETECTED NOT DETECTED Final   Bacteroides fragilis NOT DETECTED NOT DETECTED Final   Enterobacterales NOT DETECTED NOT DETECTED Final   Enterobacter cloacae complex NOT DETECTED NOT DETECTED Final   Escherichia coli NOT DETECTED NOT DETECTED Final   Klebsiella aerogenes NOT DETECTED NOT DETECTED Final   Klebsiella oxytoca NOT DETECTED NOT DETECTED Final   Klebsiella pneumoniae NOT DETECTED NOT DETECTED Final   Proteus species NOT DETECTED NOT DETECTED Final   Salmonella species NOT DETECTED NOT DETECTED Final   Serratia marcescens NOT DETECTED NOT DETECTED Final   Haemophilus influenzae NOT DETECTED NOT DETECTED Final   Neisseria meningitidis NOT DETECTED NOT DETECTED Final   Pseudomonas aeruginosa NOT DETECTED NOT DETECTED Final   Stenotrophomonas maltophilia NOT DETECTED NOT DETECTED Final   Candida albicans NOT DETECTED NOT DETECTED Final   Candida auris NOT DETECTED NOT DETECTED Final   Candida glabrata NOT DETECTED NOT DETECTED Final   Candida krusei NOT DETECTED NOT DETECTED Final   Candida parapsilosis NOT DETECTED NOT DETECTED Final   Candida tropicalis NOT DETECTED NOT DETECTED Final   Cryptococcus neoformans/gattii NOT DETECTED NOT DETECTED Final   Vancomycin resistance NOT DETECTED NOT DETECTED Final    Comment: Performed at [facility], [address] [place], [place] [address]  Culture, blood (Routine X 2) w Reflex to ID Panel     Status: None (Preliminary result)   Collection Time: 06/05/20  1:25 PM   Specimen: BLOOD  Result Value Ref Range Status   Specimen Description BLOOD LAC  Final   Special Requests   Final    BOTTLES DRAWN AEROBIC AND ANAEROBIC Blood Culture adequate volume   Culture   Final    NO GROWTH < 24 HOURS Performed at [facility], [place] [place], [place] [address]    Report Status PENDING  Incomplete    IMAGING RESULTS:  I have personally reviewed the films ? Impression/Recommendation ?Enterococcus bacteremia On hemodialysis.Marland Kitchen with congestive heart failure and pulmonary edema need to rule out endocarditis. Will get 2 d echo May  need TEE  Clinically she is pulmonary edema and not pneumonia procal not a reliable test in ESRD Currently patien on pi/tazo- would switch to ampicillin tomorrow  Pulmonary edema/CHF had to undergo urgent dialysis  Accelerated hypertension- management as per primary team  Diabetes mellitus- unclear why she is on 10% dextrose- her nurse will check with the hospitalist  DM gastroparesis with nausea/vomiting   ESRD on dialysis Anemia due to the above ? ? ___________________________________________________ Discussed with her son and her nurse Note:  This document was prepared  using Systems analyst and may include unintentional dictation errors.

## 2020-06-06 NOTE — Progress Notes (Signed)
PROGRESS NOTE  [name] JKD:[id number] DOB: 08-10-1961 DOA: 06/02/2020 PCP: [facility]  HPI/Recap of past 15 hours: 59 year old female past medical history for end-stage renal disease on hemodialysis, hypertension, diabetes mellitus, CAD, COPD, PVD admitted on 9/11 after coming in with complaints of chills, vomiting, shortness of breath and chest pain and found to be hypoxic.  Patient found to have new onset pulmonary edema, due to volume overload as well as COPD exacerbation.  Had recently missed dialysis.  Procalcitonin also elevated and patient was treated for a community-acquired pneumonia.  Patient admitted to the hospitalist service and nephrology consulted.  Patient has continued receiving dialysis.  VQ scan done ruled out PE.  Today, patient seen prior to dialysis.  She states she is feeling a little bit better.  Breathing a bit easier though she still on 2 L nasal cannula although satting at 100%.  Procalcitonin level checked and found to still be elevated at 22, was 20 on admission.  Assessment/Plan: Principal Problem: Acute respiratory failure secondary to acute diastolic CHF and HCAP (healthcare-associated pneumonia) and COPD exacerbation: Sepsis ruled out.  CHF volume is managed by hemodialysis.  Patient continue with nebulizers, supplemental oxygen.  Given no improvement with Rocephin and Zithromax, change these over to IV Zosyn.  Hope to see improvement in procalcitonin.  Patient clinically feeling better.  Hopefully we can wean her off of oxygen soon.    Anemia of chronic disease    ESRD on hemodialysis [facility]): Followed by nephrology, continued on hemodialysis    Benign essential HTN: Initially hypertensive urgency, blood pressure still elevated, better following dialysis.  Continued on home medications.  Obesity: Patient meets criteria BMI greater than 30   Code Status: Full code  Family Communication: Left message for son  Disposition Plan:  Anticipate discharge in a few days once pneumonia better treated and able to wean off of oxygen   Consultants:  Nephrology  Procedures:  VQ scan: Low probability of PE  Multiple dialysis sessions  Antimicrobials:  IV Rocephin and Zithromax 9/11-9/15  IV Zosyn 9/15-present  DVT prophylaxis: Subcu heparin   Objective: Vitals:   06/05/20 2143 06/06/20 0814  BP: (!) 136/46 (!) 165/55  Pulse: (!) 56   Resp:    Temp:    SpO2: 100%     Intake/Output Summary (Last 24 hours) at 06/06/2020 1337 Last data filed at 06/06/2020 0815 Gross per 24 hour  Intake 1215.29 ml  Output 0 ml  Net 1215.29 ml   Filed Weights   06/02/20 1056 06/05/20 0500 06/06/20 0800  Weight: 80.7 kg 82 kg 89.6 kg   Body mass index is 32.87 kg/m.  Exam:   General: Alert and oriented x3, no acute distress  HEENT: Cephalic and atraumatic, mucous membranes are slightly dry  Cardiovascular: Regular rate and rhythm, S1-S2, borderline bradycardia  Respiratory: Decreased breath sounds throughout  Abdomen: Soft, nontender, nondistended, positive bowel sounds  Musculoskeletal: No clubbing or cyanosis or edema  Skin: No skin breaks, tears or lesions  Psychiatry: Appropriate, no evidence of psychoses   Data Reviewed: CBC: Recent Labs  Lab 06/02/20 1907 06/03/20 0744 06/04/20 0658 06/05/20 1325 06/06/20 0612  WBC 5.4 8.1 3.3* 8.3 5.7  NEUTROABS 2.0  --   --   --   --   HGB 8.9* 8.0* 7.7* 8.2* 8.2*  HCT 26.5* 24.9* 23.8* 27.0* 25.2*  MCV 88.6 90.2 89.1 93.8 89.7  PLT 130* 118* 71* 77* 78*   Basic Metabolic Panel: Recent Labs  Lab 06/02/20 1907 06/03/20 0744 06/04/20 0658 06/05/20 1325 06/06/20 0612  NA 137  --  136 134* 131*  K 4.2  --  3.7 3.8 3.7  CL 98  --  97* 93* 93*  CO2 28  --  30 27 27   GLUCOSE 132*  --  98 268* 239*  BUN 25*  --  17 15 18   CREATININE 6.68* 4.56* 4.80* 4.30* 5.03*  CALCIUM 8.7*  --  7.8* 7.8* 7.8*   GFR: Estimated Creatinine Clearance: 13 mL/min  (A) (by C-G formula based on SCr of 5.03 mg/dL (H)). Liver Function Tests: No results for input(s): AST, ALT, ALKPHOS, BILITOT, PROT, ALBUMIN in the last 168 hours. No results for input(s): LIPASE, AMYLASE in the last 168 hours. No results for input(s): AMMONIA in the last 168 hours. Coagulation Profile: No results for input(s): INR, PROTIME in the last 168 hours. Cardiac Enzymes: No results for input(s): CKTOTAL, CKMB, CKMBINDEX, TROPONINI in the last 168 hours. BNP (last 3 results) No results for input(s): PROBNP in the last 8760 hours. HbA1C: No results for input(s): HGBA1C in the last 72 hours. CBG: Recent Labs  Lab 06/05/20 1626 06/05/20 2047 06/06/20 0024 06/06/20 0509 06/06/20 0823  GLUCAP 208* 199* 197* 219* 236*   Lipid Profile: No results for input(s): CHOL, HDL, LDLCALC, TRIG, CHOLHDL, LDLDIRECT in the last 72 hours. Thyroid Function Tests: No results for input(s): TSH, T4TOTAL, FREET4, T3FREE, THYROIDAB in the last 72 hours. Anemia Panel: No results for input(s): VITAMINB12, FOLATE, FERRITIN, TIBC, IRON, RETICCTPCT in the last 72 hours. Urine analysis:    Component Value Date/Time   COLORURINE YELLOW (A) 06/09/2017 1039   APPEARANCEUR CLEAR (A) 06/09/2017 1039   APPEARANCEUR Clear 09/15/2014 0947   LABSPEC 1.014 06/09/2017 1039   LABSPEC 1.008 09/15/2014 0947   PHURINE 8.0 06/09/2017 1039   GLUCOSEU 150 (A) 06/09/2017 1039   GLUCOSEU 150 mg/dL 09/15/2014 0947   HGBUR SMALL (A) 06/09/2017 1039   BILIRUBINUR NEGATIVE 06/09/2017 1039   BILIRUBINUR Negative 09/15/2014 0947   KETONESUR NEGATIVE 06/09/2017 1039   PROTEINUR >=300 (A) 06/09/2017 1039   UROBILINOGEN 0.2 06/30/2010 2130   NITRITE NEGATIVE 06/09/2017 1039   LEUKOCYTESUR NEGATIVE 06/09/2017 1039   LEUKOCYTESUR Negative 09/15/2014 0947   Sepsis Labs: @LABRCNTIP (procalcitonin:4,lacticidven:4)  ) Recent Results (from the past 240 hour(s))  SARS Coronavirus 2 by RT PCR (hospital order, performed in  [place] hospital lab) Nasopharyngeal Nasopharyngeal Swab     Status: None   Collection Time: 06/03/20  6:49 AM   Specimen: Nasopharyngeal Swab  Result Value Ref Range Status   SARS Coronavirus 2 NEGATIVE NEGATIVE Final    Comment: (NOTE) SARS-CoV-2 target nucleic acids are NOT DETECTED.  The SARS-CoV-2 RNA is generally detectable in upper and lower respiratory specimens during the acute phase of infection. The lowest concentration of SARS-CoV-2 viral copies this assay can detect is 250 copies / mL. A negative result does not preclude SARS-CoV-2 infection and should not be used as the sole basis for treatment or other patient management decisions.  A negative result may occur with improper specimen collection / handling, submission of specimen other than nasopharyngeal swab, presence of viral mutation(s) within the areas targeted by this assay, and inadequate number of viral copies (<250 copies / mL). A negative result must be combined with clinical observations, patient history, and epidemiological information.  Fact Sheet for Patients:   StrictlyIdeas.no  Fact Sheet for Healthcare Providers: BankingDealers.co.za  This test is not yet approved or  cleared  by the [place] and has been authorized for detection and/or diagnosis of SARS-CoV-2 by FDA under an Emergency Use Authorization (EUA).  This EUA will remain in effect (meaning this test can be used) for the duration of the COVID-19 declaration under Section 564(b)(1) of the Act, 21 U.S.C. section 360bbb-3(b)(1), unless the authorization is terminated or revoked sooner.  Performed at [facility], [address] [place], [place] [address]   Gastrointestinal Panel by PCR , Stool     Status: None   Collection Time: 06/04/20  7:39 PM   Specimen: Stool  Result Value Ref Range Status   Campylobacter species NOT DETECTED NOT DETECTED Final   Plesimonas  shigelloides NOT DETECTED NOT DETECTED Final   Salmonella species NOT DETECTED NOT DETECTED Final   Yersinia enterocolitica NOT DETECTED NOT DETECTED Final   Vibrio species NOT DETECTED NOT DETECTED Final   Vibrio cholerae NOT DETECTED NOT DETECTED Final   Enteroaggregative E coli (EAEC) NOT DETECTED NOT DETECTED Final   Enteropathogenic E coli (EPEC) NOT DETECTED NOT DETECTED Final   Enterotoxigenic E coli (ETEC) NOT DETECTED NOT DETECTED Final   Shiga like toxin producing E coli (STEC) NOT DETECTED NOT DETECTED Final   Shigella/Enteroinvasive E coli (EIEC) NOT DETECTED NOT DETECTED Final   Cryptosporidium NOT DETECTED NOT DETECTED Final   Cyclospora cayetanensis NOT DETECTED NOT DETECTED Final   Entamoeba histolytica NOT DETECTED NOT DETECTED Final   Giardia lamblia NOT DETECTED NOT DETECTED Final   Adenovirus F40/41 NOT DETECTED NOT DETECTED Final   Astrovirus NOT DETECTED NOT DETECTED Final   Norovirus GI/GII NOT DETECTED NOT DETECTED Final   Rotavirus A NOT DETECTED NOT DETECTED Final   Sapovirus (I, II, IV, and V) NOT DETECTED NOT DETECTED Final    Comment: Performed at [facility], [address] [place], [place] [address]  Culture, blood (Routine X 2) w Reflex to ID Panel     Status: None (Preliminary result)   Collection Time: 06/04/20  8:49 PM   Specimen: BLOOD  Result Value Ref Range Status   Specimen Description BLOOD BLOOD LEFT FOREARM  Final   Special Requests   Final    BOTTLES DRAWN AEROBIC AND ANAEROBIC Blood Culture adequate volume   Culture   Final    NO GROWTH 2 DAYS Performed at [facility], [address] [place], [place] [address]    Report Status PENDING  Incomplete  Culture, blood (Routine X 2) w Reflex to ID Panel     Status: None (Preliminary result)   Collection Time: 06/05/20  1:25 PM   Specimen: BLOOD  Result Value Ref Range Status   Specimen Description BLOOD LAC  Final   Special Requests   Final    BOTTLES DRAWN AEROBIC AND  ANAEROBIC Blood Culture adequate volume   Culture   Final    NO GROWTH < 24 HOURS Performed at [facility], [address] [place], [place] [address]    Report Status PENDING  Incomplete      Studies: DG Chest 1 View  Result Date: 06/05/2020 CLINICAL DATA:  Respiratory distress, pulmonary embolism EXAM: CHEST  1 VIEW COMPARISON:  06/02/2020 FINDINGS: Lung volumes are small and there is bibasilar atelectasis. Superimposed interstitial pulmonary edema has resolved. Tiny bilateral pleural effusions have improved in the interval. No pneumothorax. Cardiac size within normal limits. Right internal jugular he row graft is seen with its tip overlying the right atrium. Vascular stents are seen within the central left  upper extremity veins and visualized right upper extremity. Cardiac size within normal limits. IMPRESSION: Small lung volumes with bibasilar atelectasis. Superimposed interstitial pulmonary edema has resolved. Tiny bilateral pleural effusions have improved in the interval. Electronically Signed   By: Fidela Salisbury MD   On: 06/05/2020 16:54   CT HEAD WO CONTRAST  Result Date: 06/05/2020 CLINICAL DATA:  59 year old female with hypotension, diaphoresis, altered mental status today. In stage renal disease, dialysis. EXAM: CT HEAD WITHOUT CONTRAST TECHNIQUE: Contiguous axial images were obtained from the base of the skull through the vertex without intravenous contrast. COMPARISON:  Head CT 05/02/2020.  Brain MRI 05/03/2019. FINDINGS: Brain: No midline shift, ventriculomegaly, mass effect, evidence of mass lesion, intracranial hemorrhage or evidence of cortically based acute infarction. Chronic lower pontine lacunar infarcts better demonstrated on MRI last year. Elsewhere gray-white matter differentiation is within normal limits throughout the brain. Vascular: Calcified atherosclerosis at the skull base. No suspicious intracranial vascular hyperdensity. Skull: Stable visualized osseous  structures. Scattered small lucent areas in the calvarium (series 3, image 62 at the left vertex) have mildly increased since 2017 and are probably related to metabolic bone disease. Sinuses/Orbits: Visualized paranasal sinuses and mastoids are stable and well pneumatized. Other: No acute orbit or scalp soft tissue finding. Some scalp vessel calcified atherosclerosis is noted. IMPRESSION: 1. No acute intracranial abnormality. 2. Chronic lacunar infarcts in the pons. 3. Several slowly enlarging small lucent areas in the calvarium since 2017 are probably related to metabolic bone disease in the setting of chronic ESRD. Electronically Signed   By: Genevie Ann M.D.   On: 06/05/2020 16:28   NM Pulmonary Perfusion  Result Date: 06/05/2020 CLINICAL DATA:  Left chest pain EXAM: NUCLEAR MEDICINE PERFUSION LUNG SCAN TECHNIQUE: Perfusion images were obtained in multiple projections after intravenous injection of radiopharmaceutical. Ventilation scans intentionally deferred if perfusion scan and chest x-ray adequate for interpretation during COVID 19 epidemic. RADIOPHARMACEUTICALS:  4.724 mCi Tc-61m MAA IV COMPARISON:  None. Findings are correlated with concurrently performed chest radiograph FINDINGS: There is no significant perfusion defect identified. IMPRESSION: Negative for pulmonary embolism Electronically Signed   By: Fidela Salisbury MD   On: 06/05/2020 16:56    Scheduled Meds: . atorvastatin  20 mg Oral Daily  . carvedilol  6.25 mg Oral BID WC  . Chlorhexidine Gluconate Cloth  6 each Topical Daily  . epoetin (EPOGEN/PROCRIT) injection  10,000 Units Intravenous Q M,W,F-HD  . furosemide  80 mg Oral Daily  . heparin  5,000 Units Subcutaneous Q8H  . lipase/protease/amylase  12,000 Units Oral TID WC  .  morphine injection  4 mg Intravenous Once  . sevelamer carbonate  800 mg Oral TID WC    Continuous Infusions: . dextrose 20 mL/hr at 06/06/20 0158     LOS: 3 days     Annita Brod, MD [facility]   06/06/2020, 1:37 PM

## 2020-06-07 ENCOUNTER — Inpatient Hospital Stay: Payer: Medicare HMO

## 2020-06-07 ENCOUNTER — Inpatient Hospital Stay
Admit: 2020-06-07 | Discharge: 2020-06-07 | Disposition: A | Discharge status: Home / Self Care | Payer: Medicare HMO | Attending: Infectious Diseases | Admitting: Infectious Diseases

## 2020-06-07 DIAGNOSIS — R0781 Pleurodynia: Secondary | ICD-10-CM

## 2020-06-07 DIAGNOSIS — E669 Obesity, unspecified: Secondary | ICD-10-CM

## 2020-06-07 LAB — BLOOD CULTURE ID PANEL (REFLEXED) - BCID2
A.calcoaceticus-baumannii: NOT DETECTED
Bacteroides fragilis: NOT DETECTED
Candida albicans: NOT DETECTED
Candida auris: NOT DETECTED
Candida glabrata: NOT DETECTED
Candida krusei: NOT DETECTED
Candida parapsilosis: NOT DETECTED
Candida tropicalis: NOT DETECTED
Cryptococcus neoformans/gattii: NOT DETECTED
Enterobacter cloacae complex: NOT DETECTED
Enterobacterales: NOT DETECTED
Enterococcus Faecium: NOT DETECTED
Enterococcus faecalis: DETECTED — AB
Escherichia coli: NOT DETECTED
Haemophilus influenzae: NOT DETECTED
Klebsiella aerogenes: NOT DETECTED
Klebsiella oxytoca: NOT DETECTED
Klebsiella pneumoniae: NOT DETECTED
Listeria monocytogenes: NOT DETECTED
Neisseria meningitidis: NOT DETECTED
Proteus species: NOT DETECTED
Pseudomonas aeruginosa: NOT DETECTED
Salmonella species: NOT DETECTED
Serratia marcescens: NOT DETECTED
Staphylococcus aureus (BCID): NOT DETECTED
Staphylococcus epidermidis: NOT DETECTED
Staphylococcus lugdunensis: NOT DETECTED
Staphylococcus species: NOT DETECTED
Stenotrophomonas maltophilia: NOT DETECTED
Streptococcus agalactiae: NOT DETECTED
Streptococcus pneumoniae: NOT DETECTED
Streptococcus pyogenes: NOT DETECTED
Streptococcus species: NOT DETECTED
Vancomycin resistance: NOT DETECTED

## 2020-06-07 LAB — ECHOCARDIOGRAM COMPLETE
AR max vel: 1.86 cm2
AV Area VTI: 1.68 cm2
AV Area mean vel: 1.65 cm2
AV Mean grad: 5 mmHg
AV Peak grad: 8 mmHg
Ao pk vel: 1.41 m/s
Area-P 1/2: 3.6 cm2
Height: 65 in
S' Lateral: 2.89 cm
Weight: 3160.51 oz

## 2020-06-07 LAB — GLUCOSE, CAPILLARY
Glucose-Capillary: 133 mg/dL — ABNORMAL HIGH (ref 70–99)
Glucose-Capillary: 138 mg/dL — ABNORMAL HIGH (ref 70–99)
Glucose-Capillary: 155 mg/dL — ABNORMAL HIGH (ref 70–99)
Glucose-Capillary: 160 mg/dL — ABNORMAL HIGH (ref 70–99)
Glucose-Capillary: 178 mg/dL — ABNORMAL HIGH (ref 70–99)

## 2020-06-07 MED ORDER — RENA-VITE PO TABS
1.0000 | ORAL_TABLET | Freq: Every day | ORAL | Status: DC
  Administered 2020-06-07 – 2020-06-25 (×13): 1 via ORAL
  Filled 2020-06-07 (×14): qty 1

## 2020-06-07 MED ORDER — FERROUS SULFATE 325 (65 FE) MG PO TABS
325.0000 mg | ORAL_TABLET | Freq: Every day | ORAL | Status: DC
  Administered 2020-06-07 – 2020-06-25 (×13): 325 mg via ORAL
  Filled 2020-06-07 (×14): qty 1

## 2020-06-07 MED ORDER — CHOLESTYRAMINE LIGHT 4 G PO PACK
4.0000 g | PACK | Freq: Two times a day (BID) | ORAL | Status: DC
  Administered 2020-06-07 – 2020-06-24 (×20): 4 g via ORAL
  Filled 2020-06-07 (×38): qty 1

## 2020-06-07 MED ORDER — VITAMIN B-12 1000 MCG PO TABS
1000.0000 ug | ORAL_TABLET | Freq: Every day | ORAL | Status: DC
  Administered 2020-06-07 – 2020-06-25 (×12): 1000 ug via ORAL
  Filled 2020-06-07 (×14): qty 1

## 2020-06-07 MED ORDER — SODIUM CHLORIDE 0.9 % IV SOLN
2.0000 g | Freq: Three times a day (TID) | INTRAVENOUS | Status: DC
  Administered 2020-06-07: 2 g via INTRAVENOUS
  Filled 2020-06-07 (×2): qty 2000
  Filled 2020-06-07: qty 2
  Filled 2020-06-07 (×2): qty 2000

## 2020-06-07 MED ORDER — PIPERACILLIN-TAZOBACTAM IN DEX 2-0.25 GM/50ML IV SOLN
2.2500 g | Freq: Three times a day (TID) | INTRAVENOUS | Status: DC
  Administered 2020-06-07 – 2020-06-08 (×2): 2.25 g via INTRAVENOUS
  Filled 2020-06-07 (×4): qty 50

## 2020-06-07 MED ORDER — GABAPENTIN 100 MG PO CAPS
100.0000 mg | ORAL_CAPSULE | Freq: Every evening | ORAL | Status: DC
  Administered 2020-06-07 – 2020-06-24 (×13): 100 mg via ORAL
  Filled 2020-06-07 (×14): qty 1

## 2020-06-07 MED ORDER — HYDROXYZINE HCL 25 MG PO TABS
25.0000 mg | ORAL_TABLET | Freq: Every day | ORAL | Status: DC
  Administered 2020-06-07 – 2020-06-25 (×13): 25 mg via ORAL
  Filled 2020-06-07 (×20): qty 1

## 2020-06-07 MED ORDER — ASPIRIN EC 81 MG PO TBEC
81.0000 mg | DELAYED_RELEASE_TABLET | Freq: Every day | ORAL | Status: DC
  Administered 2020-06-07 – 2020-06-25 (×11): 81 mg via ORAL
  Filled 2020-06-07 (×13): qty 1

## 2020-06-07 NOTE — Progress Notes (Signed)
[facility]  ROUNDING NOTE   Subjective:  Patient underwent dialysis treatment yesterday. Still having some right-sided chest pain.   Objective:  Vital signs in last 24 hours:  Temp:  [98 F (36.7 C)-98.9 F (37.2 C)] 98 F (36.7 C) (09/16 0428) Pulse Rate:  [63-67] 63 (09/16 0428) Resp:  [14-16] 16 (09/16 0428) BP: (95-156)/(41-52) 133/52 (09/16 0428) SpO2:  [94 %-100 %] 95 % (09/16 0428)  Weight change:  Filed Weights   06/02/20 1056 06/05/20 0500 06/06/20 0800  Weight: 80.7 kg 82 kg 89.6 kg    Intake/Output: I/O last 3 completed shifts: In: 1266 [P.O.:60; I.V.:1156; IV Piggyback:50] Out: 0    Intake/Output this shift:  Total I/O In: 120 [P.O.:120] Out: -   Physical Exam: General:  No acute distress  Head:  Oral mucosal membranes moist  Eyes:  Anicteric  Neck:  Supple  Lungs:   Basilar rales, normal effort  Heart:  Regular, S1S2   Abdomen:   Soft, nontender, non distended  Extremities:  Trace peripheral edema.  Neurologic:  Able to follow commands  Skin:  No acute  Lesions or rashes  Access:  Right upper extremity AV access    Basic Metabolic Panel: Recent Labs  Lab 06/02/20 1907 06/02/20 1907 06/03/20 0744 06/04/20 0658 06/05/20 1325 06/06/20 0612  NA 137  --   --  136 134* 131*  K 4.2  --   --  3.7 3.8 3.7  CL 98  --   --  97* 93* 93*  CO2 28  --   --  30 27 27   GLUCOSE 132*  --   --  98 268* 239*  BUN 25*  --   --  17 15 18   CREATININE 6.68*  --  4.56* 4.80* 4.30* 5.03*  CALCIUM 8.7*   < >  --  7.8* 7.8* 7.8*   < > = values in this interval not displayed.    Liver Function Tests: No results for input(s): AST, ALT, ALKPHOS, BILITOT, PROT, ALBUMIN in the last 168 hours. No results for input(s): LIPASE, AMYLASE in the last 168 hours. No results for input(s): AMMONIA in the last 168 hours.  CBC: Recent Labs  Lab 06/02/20 1907 06/03/20 0744 06/04/20 0658 06/05/20 1325 06/06/20 0612  WBC 5.4 8.1 3.3* 8.3 5.7  NEUTROABS  2.0  --   --   --   --   HGB 8.9* 8.0* 7.7* 8.2* 8.2*  HCT 26.5* 24.9* 23.8* 27.0* 25.2*  MCV 88.6 90.2 89.1 93.8 89.7  PLT 130* 118* 71* 77* 78*    Cardiac Enzymes: No results for input(s): CKTOTAL, CKMB, CKMBINDEX, TROPONINI in the last 168 hours.  BNP: Invalid input(s): POCBNP  CBG: Recent Labs  Lab 06/06/20 1526 06/06/20 1914 06/06/20 2330 06/07/20 0426 06/07/20 0752  GLUCAP 141* 192* 170* 178* 155*    Microbiology: Results for orders placed or performed during the hospital encounter of 06/02/20  SARS Coronavirus 2 by RT PCR (hospital order, performed in [facility] hospital lab) Nasopharyngeal Nasopharyngeal Swab     Status: None   Collection Time: 06/03/20  6:49 AM   Specimen: Nasopharyngeal Swab  Result Value Ref Range Status   SARS Coronavirus 2 NEGATIVE NEGATIVE Final    Comment: (NOTE) SARS-CoV-2 target nucleic acids are NOT DETECTED.  The SARS-CoV-2 RNA is generally detectable in upper and lower respiratory specimens during the acute phase of infection. The lowest concentration of SARS-CoV-2 viral copies this assay can detect is 250 copies / mL.  A negative result does not preclude SARS-CoV-2 infection and should not be used as the sole basis for treatment or other patient management decisions.  A negative result may occur with improper specimen collection / handling, submission of specimen other than nasopharyngeal swab, presence of viral mutation(s) within the areas targeted by this assay, and inadequate number of viral copies (<250 copies / mL). A negative result must be combined with clinical observations, patient history, and epidemiological information.  Fact Sheet for Patients:   StrictlyIdeas.no  Fact Sheet for Healthcare Providers: BankingDealers.co.za  This test is not yet approved or  cleared by the [place] FDA and has been authorized for detection and/or diagnosis of SARS-CoV-2 by FDA under  an Emergency Use Authorization (EUA).  This EUA will remain in effect (meaning this test can be used) for the duration of the COVID-19 declaration under Section 564(b)(1) of the Act, 21 U.S.C. section 360bbb-3(b)(1), unless the authorization is terminated or revoked sooner.  Performed at [facility], [address] [place], [place] [address]   Gastrointestinal Panel by PCR , Stool     Status: None   Collection Time: 06/04/20  7:39 PM   Specimen: Stool  Result Value Ref Range Status   Campylobacter species NOT DETECTED NOT DETECTED Final   Plesimonas shigelloides NOT DETECTED NOT DETECTED Final   Salmonella species NOT DETECTED NOT DETECTED Final   Yersinia enterocolitica NOT DETECTED NOT DETECTED Final   Vibrio species NOT DETECTED NOT DETECTED Final   Vibrio cholerae NOT DETECTED NOT DETECTED Final   Enteroaggregative E coli (EAEC) NOT DETECTED NOT DETECTED Final   Enteropathogenic E coli (EPEC) NOT DETECTED NOT DETECTED Final   Enterotoxigenic E coli (ETEC) NOT DETECTED NOT DETECTED Final   Shiga like toxin producing E coli (STEC) NOT DETECTED NOT DETECTED Final   Shigella/Enteroinvasive E coli (EIEC) NOT DETECTED NOT DETECTED Final   Cryptosporidium NOT DETECTED NOT DETECTED Final   Cyclospora cayetanensis NOT DETECTED NOT DETECTED Final   Entamoeba histolytica NOT DETECTED NOT DETECTED Final   Giardia lamblia NOT DETECTED NOT DETECTED Final   Adenovirus F40/41 NOT DETECTED NOT DETECTED Final   Astrovirus NOT DETECTED NOT DETECTED Final   Norovirus GI/GII NOT DETECTED NOT DETECTED Final   Rotavirus A NOT DETECTED NOT DETECTED Final   Sapovirus (I, II, IV, and V) NOT DETECTED NOT DETECTED Final    Comment: Performed at [facility], [address] [place], [place] [address]  Culture, blood (Routine X 2) w Reflex to ID Panel     Status: None (Preliminary result)   Collection Time: 06/04/20  8:49 PM   Specimen: BLOOD  Result Value Ref Range Status    Specimen Description BLOOD BLOOD LEFT FOREARM  Final   Special Requests   Final    BOTTLES DRAWN AEROBIC AND ANAEROBIC Blood Culture adequate volume   Culture  Setup Time   Final    ANAEROBIC BOTTLE ONLY GRAM POSITIVE COCCI Organism ID to follow CRITICAL RESULT CALLED TO, READ BACK BY AND VERIFIED WITH: CHARLES SHANLEVER @1636  06/06/20 MJU AEROBIC BOTTLE ONLY GRAM POSITIVE COCCOBACILLUS LISA KLUTTZ AT 1019 ON 06/07/2020 [place].    Culture   Final    GRAM POSITIVE COCCI GRAM POSITIVE COCCOBACILLUS Performed at [facility], [address] [place], [place] [address]    Report Status PENDING  Incomplete  Blood Culture ID Panel (Reflexed)     Status: Abnormal   Collection Time: 06/04/20  8:49 PM  Result Value Ref Range Status  Enterococcus faecalis DETECTED (A) NOT DETECTED Final    Comment: CRITICAL RESULT CALLED TO, READ BACK BY AND VERIFIED WITH: CHARLES SHANLEVER @1636  06/06/20 MJU    Enterococcus Faecium NOT DETECTED NOT DETECTED Final   Listeria monocytogenes NOT DETECTED NOT DETECTED Final   Staphylococcus species NOT DETECTED NOT DETECTED Final   Staphylococcus aureus (BCID) NOT DETECTED NOT DETECTED Final   Staphylococcus epidermidis NOT DETECTED NOT DETECTED Final   Staphylococcus lugdunensis NOT DETECTED NOT DETECTED Final   Streptococcus species NOT DETECTED NOT DETECTED Final   Streptococcus agalactiae NOT DETECTED NOT DETECTED Final   Streptococcus pneumoniae NOT DETECTED NOT DETECTED Final   Streptococcus pyogenes NOT DETECTED NOT DETECTED Final   A.calcoaceticus-baumannii NOT DETECTED NOT DETECTED Final   Bacteroides fragilis NOT DETECTED NOT DETECTED Final   Enterobacterales NOT DETECTED NOT DETECTED Final   Enterobacter cloacae complex NOT DETECTED NOT DETECTED Final   Escherichia coli NOT DETECTED NOT DETECTED Final   Klebsiella aerogenes NOT DETECTED NOT DETECTED Final   Klebsiella oxytoca NOT DETECTED NOT DETECTED Final   Klebsiella pneumoniae NOT  DETECTED NOT DETECTED Final   Proteus species NOT DETECTED NOT DETECTED Final   Salmonella species NOT DETECTED NOT DETECTED Final   Serratia marcescens NOT DETECTED NOT DETECTED Final   Haemophilus influenzae NOT DETECTED NOT DETECTED Final   Neisseria meningitidis NOT DETECTED NOT DETECTED Final   Pseudomonas aeruginosa NOT DETECTED NOT DETECTED Final   Stenotrophomonas maltophilia NOT DETECTED NOT DETECTED Final   Candida albicans NOT DETECTED NOT DETECTED Final   Candida auris NOT DETECTED NOT DETECTED Final   Candida glabrata NOT DETECTED NOT DETECTED Final   Candida krusei NOT DETECTED NOT DETECTED Final   Candida parapsilosis NOT DETECTED NOT DETECTED Final   Candida tropicalis NOT DETECTED NOT DETECTED Final   Cryptococcus neoformans/gattii NOT DETECTED NOT DETECTED Final   Vancomycin resistance NOT DETECTED NOT DETECTED Final    Comment: Performed at [facility], [address] [place], [place] [address]  Culture, blood (Routine X 2) w Reflex to ID Panel     Status: None (Preliminary result)   Collection Time: 06/05/20  1:25 PM   Specimen: BLOOD  Result Value Ref Range Status   Specimen Description BLOOD LAC  Final   Special Requests   Final    BOTTLES DRAWN AEROBIC AND ANAEROBIC Blood Culture adequate volume   Culture   Final    NO GROWTH 2 DAYS Performed at [facility], [address] [place],  [address]    Report Status PENDING  Incomplete    Coagulation Studies: No results for input(s): LABPROT, INR in the last 72 hours.  Urinalysis: No results for input(s): COLORURINE, LABSPEC, PHURINE, GLUCOSEU, HGBUR, BILIRUBINUR, KETONESUR, PROTEINUR, UROBILINOGEN, NITRITE, LEUKOCYTESUR in the last 72 hours.  Invalid input(s): APPERANCEUR    Imaging: DG Chest 1 View  Result Date: 06/05/2020 CLINICAL DATA:  Respiratory distress, pulmonary embolism EXAM: CHEST  1 VIEW COMPARISON:  06/02/2020 FINDINGS: Lung volumes are small and there is  bibasilar atelectasis. Superimposed interstitial pulmonary edema has resolved. Tiny bilateral pleural effusions have improved in the interval. No pneumothorax. Cardiac size within normal limits. Right internal jugular he row graft is seen with its tip overlying the right atrium. Vascular stents are seen within the central left upper extremity veins and visualized right upper extremity. Cardiac size within normal limits. IMPRESSION: Small lung volumes with bibasilar atelectasis. Superimposed interstitial pulmonary edema has resolved. Tiny bilateral pleural effusions have improved in the interval. Electronically Signed  By: Fidela Salisbury MD   On: 06/05/2020 16:54   CT HEAD WO CONTRAST  Result Date: 06/05/2020 CLINICAL DATA:  60 year old female with hypotension, diaphoresis, altered mental status today. In stage renal disease, dialysis. EXAM: CT HEAD WITHOUT CONTRAST TECHNIQUE: Contiguous axial images were obtained from the base of the skull through the vertex without intravenous contrast. COMPARISON:  Head CT 05/02/2020.  Brain MRI 05/03/2019. FINDINGS: Brain: No midline shift, ventriculomegaly, mass effect, evidence of mass lesion, intracranial hemorrhage or evidence of cortically based acute infarction. Chronic lower pontine lacunar infarcts better demonstrated on MRI last year. Elsewhere gray-white matter differentiation is within normal limits throughout the brain. Vascular: Calcified atherosclerosis at the skull base. No suspicious intracranial vascular hyperdensity. Skull: Stable visualized osseous structures. Scattered small lucent areas in the calvarium (series 3, image 62 at the left vertex) have mildly increased since 2017 and are probably related to metabolic bone disease. Sinuses/Orbits: Visualized paranasal sinuses and mastoids are stable and well pneumatized. Other: No acute orbit or scalp soft tissue finding. Some scalp vessel calcified atherosclerosis is noted. IMPRESSION: 1. No acute  intracranial abnormality. 2. Chronic lacunar infarcts in the pons. 3. Several slowly enlarging small lucent areas in the calvarium since 2017 are probably related to metabolic bone disease in the setting of chronic ESRD. Electronically Signed   By: Genevie Ann M.D.   On: 06/05/2020 16:28   NM Pulmonary Perfusion  Result Date: 06/05/2020 CLINICAL DATA:  Left chest pain EXAM: NUCLEAR MEDICINE PERFUSION LUNG SCAN TECHNIQUE: Perfusion images were obtained in multiple projections after intravenous injection of radiopharmaceutical. Ventilation scans intentionally deferred if perfusion scan and chest x-ray adequate for interpretation during COVID 19 epidemic. RADIOPHARMACEUTICALS:  4.724 mCi Tc-57m MAA IV COMPARISON:  None. Findings are correlated with concurrently performed chest radiograph FINDINGS: There is no significant perfusion defect identified. IMPRESSION: Negative for pulmonary embolism Electronically Signed   By: Fidela Salisbury MD   On: 06/05/2020 16:56   ECHOCARDIOGRAM COMPLETE  Result Date: 06/07/2020    ECHOCARDIOGRAM REPORT   Patient Name:   [name] Date of Exam: 06/07/2020 Medical Rec #:  [id number]   Height:       65.0 in Accession #:    [id number]  Weight:       197.5 lb Date of Birth:  28-Oct-1960    BSA:          1.968 m Patient Age:    28 years    BP:           133/52 mmHg Patient Gender: F           HR:           63 bpm. Exam Location:  [facility] Procedure: 2D Echo, Cardiac Doppler and Color Doppler Indications:     Bacteremia 790.7  History:         Patient has prior history of Echocardiogram examinations, most                  recent 02/24/2020. Previous Myocardial Infarction, COPD; Risk                  Factors:Diabetes. Mitral Regurgitation.  Sonographer:     Sherrie Sport RDCS (AE) Referring Phys:  [id number] Tsosie Billing Diagnosing Phys: Bartholome Bill MD  Sonographer Comments: Suboptimal apical window. IMPRESSIONS  1. Left ventricular ejection fraction, by estimation, is 60 to 65%. The left  ventricle has normal function. The left ventricle has no regional wall motion abnormalities. There is mild  left ventricular hypertrophy. Left ventricular diastolic parameters are consistent with Grade I diastolic dysfunction (impaired relaxation).  2. Right ventricular systolic function was not well visualized. The right ventricular size is mildly enlarged.  3. Right atrial size was mildly dilated.  4. The mitral valve is grossly normal. Trivial mitral valve regurgitation.  5. Tricuspid valve regurgitation is moderate.  6. The aortic valve is tricuspid. Aortic valve regurgitation is not visualized. FINDINGS  Left Ventricle: Left ventricular ejection fraction, by estimation, is 60 to 65%. The left ventricle has normal function. The left ventricle has no regional wall motion abnormalities. The left ventricular internal cavity size was normal in size. There is  mild left ventricular hypertrophy. Left ventricular diastolic parameters are consistent with Grade I diastolic dysfunction (impaired relaxation). Right Ventricle: The right ventricular size is mildly enlarged. Right vetricular wall thickness was not well visualized. Right ventricular systolic function was not well visualized. Left Atrium: Left atrial size was normal in size. Right Atrium: Right atrial size was mildly dilated. Pericardium: There is no evidence of pericardial effusion. Mitral Valve: The mitral valve is grossly normal. Trivial mitral valve regurgitation. Tricuspid Valve: The tricuspid valve is grossly normal. Tricuspid valve regurgitation is moderate. Aortic Valve: The aortic valve is tricuspid. Aortic valve regurgitation is not visualized. Aortic valve mean gradient measures 5.0 mmHg. Aortic valve peak gradient measures 8.0 mmHg. Aortic valve area, by VTI measures 1.68 cm. Pulmonic Valve: The pulmonic valve was not well visualized. Pulmonic valve regurgitation is trivial. Aorta: The aortic root was not well visualized. IAS/Shunts: The interatrial  septum was not assessed.  LEFT VENTRICLE PLAX 2D LVIDd:         4.60 cm  Diastology LVIDs:         2.89 cm  LV e' medial:    6.20 cm/s LV PW:         0.95 cm  LV E/e' medial:  17.3 LV IVS:        0.98 cm  LV e' lateral:   9.03 cm/s LVOT diam:     2.00 cm  LV E/e' lateral: 11.8 LV SV:         52 LV SV Index:   27 LVOT Area:     3.14 cm  RIGHT VENTRICLE RV Basal diam:  5.30 cm RV S prime:     12.00 cm/s TAPSE (M-mode): 3.5 cm LEFT ATRIUM             Index       RIGHT ATRIUM           Index LA diam:        3.70 cm 1.88 cm/m  RA Area:     29.00 cm LA Vol (A2C):   75.4 ml 38.32 ml/m RA Volume:   120.00 ml 60.98 ml/m LA Vol (A4C):   34.6 ml 17.58 ml/m LA Biplane Vol: 53.2 ml 27.04 ml/m  AORTIC VALVE                    PULMONIC VALVE AV Area (Vmax):    1.86 cm     PV Vmax:        0.59 m/s AV Area (Vmean):   1.65 cm     PV Peak grad:   1.4 mmHg AV Area (VTI):     1.68 cm     RVOT Peak grad: 2 mmHg AV Vmax:           141.00 cm/s AV Vmean:  103.350 cm/s AV VTI:            0.310 m AV Peak Grad:      8.0 mmHg AV Mean Grad:      5.0 mmHg LVOT Vmax:         83.60 cm/s LVOT Vmean:        54.200 cm/s LVOT VTI:          0.166 m LVOT/AV VTI ratio: 0.54  AORTA Ao Root diam: 2.40 cm MITRAL VALVE                TRICUSPID VALVE MV Area (PHT): 3.60 cm     TR Peak grad:   60.2 mmHg MV Decel Time: 211 msec     TR Vmax:        388.00 cm/s MV E velocity: 107.00 cm/s MV A velocity: 104.00 cm/s  SHUNTS MV E/A ratio:  1.03         Systemic VTI:  0.17 m                             Systemic Diam: 2.00 cm Bartholome Bill MD Electronically signed by Bartholome Bill MD Signature Date/Time: 06/07/2020/10:42:33 AM    Final      Medications:   . ampicillin (OMNIPEN) IV     . aspirin EC  81 mg Oral Daily  . atorvastatin  20 mg Oral Daily  . carvedilol  6.25 mg Oral BID WC  . Chlorhexidine Gluconate Cloth  6 each Topical Daily  . cholestyramine light  4 g Oral BID  . epoetin (EPOGEN/PROCRIT) injection  10,000 Units Intravenous Q  M,W,F-HD  . ferrous sulfate  325 mg Oral Daily  . furosemide  80 mg Oral Daily  . gabapentin  100 mg Oral QPM  . heparin  5,000 Units Subcutaneous Q8H  . hydrOXYzine  25 mg Oral Daily  . lipase/protease/amylase  12,000 Units Oral TID WC  .  morphine injection  4 mg Intravenous Once  . multivitamin  1 tablet Oral Daily  . sevelamer carbonate  800 mg Oral TID WC  . cyanocobalamin  1,000 mcg Oral Daily   acetaminophen **OR** acetaminophen, calcium carbonate, camphor-menthol **AND** hydrOXYzine, feeding supplement (NEPRO CARB STEADY), hydrALAZINE, ipratropium-albuterol, loperamide, morphine injection, naLOXone (NARCAN)  injection, ondansetron **OR** ondansetron (ZOFRAN) IV, oxyCODONE-acetaminophen, promethazine, sorbitol, zolpidem  Assessment/ Plan:  59 y.o. female with past medical history of ESRD on HD MWF, hypertension, COPD, diastolic heart failure, depression, diabetes mellitus type 2, hyperlipidemia, anemia of chronic kidney disease, secondary hyperparathyroidism, new dialysis access placement 04/12/2020 who presented with missed dialysis treatment and volume overload.  [facility]/MWF3/[place]/81kg.  1.  ESRD on HD MWF/volume overload/pulmonary edema.   -Patient had dialysis treatment performed yesterday.  No acute indication for dialysis today.  2.  Anemia of chronic kidney disease.   Administer Epogen 10,000 units IV with dialysis tomorrow.  3.  Secondary hyperparathyroidism.  Maintain the patient on Renvela 800 mg p.o. 3 times daily with meals   LOS: 4 Sheryn Aldaz 9/16/202111:21 AM

## 2020-06-07 NOTE — Evaluation (Signed)
Occupational Therapy Evaluation Patient Details Name: [name] MRN: [id number] DOB: [birth year] Today's Date: 06/07/2020    History of Present Illness [name] is a 59 y.o. female with medical history significant of end-stage renal disease on hemodialysis Mondays Wednesdays and Fridays, hypertension, COPD, diastolic dysfunction CHF, depression, diabetes, hyperlipidemia, who apparently missed her dialysis on Friday.  Came to the ER with complaining of significant shortness of breath and cough.   Clinical Impression   Patient presenting with decreased I in self care, balance, functional transfers/mobility, endurance, strength, and safety awareness. Pt reports being mod I PTA and using community transport for dialysis. Patient currently functioning at min A overall with use of RW. Patient will benefit from acute OT to increase overall independence in the areas of ADLs, functional mobility, and safety awareness in order to safely discharge home.  Follow Up Recommendations  Home health OT;Supervision - Intermittent    Equipment Recommendations  None recommended by OT       Precautions / Restrictions Precautions Precautions: Fall      Mobility Bed Mobility Overal bed mobility: Needs Assistance Bed Mobility: Supine to Sit Rolling: Supervision   Supine to sit: Supervision     General bed mobility comments: min cuing for hand placement and increased time  Transfers Overall transfer level: Needs assistance Equipment used: Rolling walker (2 wheeled)      General transfer comment: sit to stand with supervision    Balance Overall balance assessment: Needs assistance Sitting-balance support: Feet unsupported Sitting balance-Leahy Scale: Good Sitting balance - Comments: no LOB on EOB         ADL either performed or assessed with clinical judgement   ADL Overall ADL's : Needs assistance/impaired     Grooming: Wash/dry hands;Wash/dry face;Oral care;Set up;Sitting   Upper  Body Bathing: Set up;Sitting   Lower Body Bathing: Minimal assistance;Sit to/from stand   Upper Body Dressing : Set up;Sitting   Lower Body Dressing: Minimal assistance;Sit to/from stand   Toilet Transfer: Minimal assistance;RW   Toileting- Clothing Manipulation and Hygiene: Minimal assistance;Sit to/from stand        Vision Baseline Vision/History: No visual deficits              Pertinent Vitals/Pain Pain Assessment: 0-10 Pain Score: 2  Pain Location: generalized Pain Descriptors / Indicators: Discomfort Pain Intervention(s): Limited activity within patient's tolerance;Monitored during session;Repositioned;RN gave pain meds during session     Hand Dominance Left   Extremity/Trunk Assessment Upper Extremity Assessment Upper Extremity Assessment: Generalized weakness   Lower Extremity Assessment Lower Extremity Assessment: Generalized weakness   Cervical / Trunk Assessment Cervical / Trunk Assessment: Normal   Communication Communication Communication: No difficulties   Cognition Arousal/Alertness: Awake/alert Behavior During Therapy: WFL for tasks assessed/performed Overall Cognitive Status: Within Functional Limits for tasks assessed                       Home Living Family/patient expects to be discharged to:: Private residence Living Arrangements: Alone Available Help at Discharge: Family;Available PRN/intermittently Type of Home: Apartment Home Access: Level entry     Home Layout: One level     Bathroom Shower/Tub: Teacher, early years/pre: Standard Bathroom Accessibility: Yes   Home Equipment: Walker - 4 wheels;Cane - single point;Cane - quad;Wheelchair - manual;Toilet riser          Prior Functioning/Environment Level of Independence: Independent with assistive device(s)  OT Problem List: Decreased strength;Decreased activity tolerance;Decreased safety awareness;Impaired balance (sitting and/or standing)       OT Treatment/Interventions: Self-care/ADL training;Therapeutic exercise;Therapeutic activities;Energy conservation;Patient/family education;DME and/or AE instruction;Balance training    OT Goals(Current goals can be found in the care plan section) Acute Rehab OT Goals Patient Stated Goal: to go home OT Goal Formulation: With patient Time For Goal Achievement: 06/21/20 Potential to Achieve Goals: Good  OT Frequency: Min 1X/week   Barriers to D/C: Other (comment)  none known at this time          AM-PAC OT "6 Clicks" Daily Activity     Outcome Measure Help from another person eating meals?: None Help from another person taking care of personal grooming?: None Help from another person toileting, which includes using toliet, bedpan, or urinal?: A Little Help from another person bathing (including washing, rinsing, drying)?: A Little Help from another person to put on and taking off regular upper body clothing?: None Help from another person to put on and taking off regular lower body clothing?: A Little 6 Click Score: 21   End of Session Nurse Communication: Mobility status  Activity Tolerance: Patient tolerated treatment well Patient left: in bed;with nursing/sitter in room;with call bell/phone within reach;with bed alarm set  OT Visit Diagnosis: Muscle weakness (generalized) (M62.81)                Time: 6720-9470 OT Time Calculation (min): 26 min Charges:  OT General Charges $OT Visit: 1 Visit OT Evaluation $OT Eval Low Complexity: 1 Low OT Treatments $Self Care/Home Management : 8-22 mins   Darleen Crocker, MS, OTR/L , CBIS ascom [phone]  06/07/20, 11:01 AM   06/07/2020, 11:00 AM

## 2020-06-07 NOTE — Progress Notes (Signed)
ID Pt with numerous UE AVF.GRAFT complications 5/45/62 creation of rt arm prosthetic graft AV graft 7/23 removal of rt IJ permacath and placement of RT common femoral dialysis cath  9/3 femoral dialysis catheter removed 9/9 seen Dr.Schneir and his note says still having problem with cannulation of the hero graft 9/11 admitted with SOB  Pt says she is feeling a little better today  Patient Vitals for the past 24 hrs:  BP Temp Temp src Pulse Resp SpO2  06/07/20 1205 (!) 160/50 98.4 F (36.9 C) Oral 61 16 93 %  06/07/20 0428 (!) 133/52 98 F (36.7 C) Oral 63 16 95 %  06/06/20 1951 (!) 116/51 98.6 F (37 C) Oral 63 14 100 %  06/06/20 1923 (!) 95/41 -- -- -- -- --  06/06/20 1900 -- 98.5 F (36.9 C) Oral -- -- --   O/e awake and alert Pale Erythematous firm swelling rt cubital area Graft arm       Rt thigh at the site of prior catheter has a firm nodule  CBC Latest Ref Rng & Units 06/06/2020 06/05/2020 06/04/2020  WBC 4.0 - 10.5 K/uL 5.7 8.3 3.3(L)  Hemoglobin 12.0 - 15.0 g/dL 8.2(L) 8.2(L) 7.7(L)  Hematocrit 36 - 46 % 25.2(L) 27.0(L) 23.8(L)  Platelets 150 - 400 K/uL 78(L) 77(L) 71(L)    CMP Latest Ref Rng & Units 06/06/2020 06/05/2020 06/04/2020  Glucose 70 - 99 mg/dL 239(H) 268(H) 98  BUN 8 - 23 mg/dL 18 15 17   Creatinine 0.44 - 1.00 mg/dL 5.03(H) 4.30(H) 4.80(H)  Sodium 135 - 145 mmol/L 131(L) 134(L) 136  Potassium 3.5 - 5.1 mmol/L 3.7 3.8 3.7  Chloride 98 - 111 mmol/L 93(L) 93(L) 97(L)  CO2 22 - 32 mmol/L 27 27 30   Calcium 8.9 - 10.3 mg/dL 7.8(L) 7.8(L) 7.8(L)  Total Protein 6.5 - 8.1 g/dL - - -  Total Bilirubin 0.3 - 1.2 mg/dL - - -  Alkaline Phos 38 - 126 U/L - - -  AST 15 - 41 U/L - - -  ALT 0 - 44 U/L - - -  BC- 9/13 and 9/14 enterococcus   Impression/recommendation Enterococcus bacteremia source could be the graft site as there is a firm erythematous swelling rt arm. Will discuss with Dr.Schnier  congestive heart failure and pulmonary edema need to rule out  endocarditis. 2 d echo shows moderate tricuspid regurg Will  need TEE  Clinically she is pulmonary edema and not pneumonia procal not a reliable test in ESRD Continue zosyn until the culture finalizes as there is a report of gram neg cocco bacilli in the gram stain  Pulmonary edema/CHF had to undergo urgent dialysis  Accelerated hypertension- management as per primary team  Diabetes mellitus- unclear why she is on 10% dextrose- her nurse will check with the hospitalist  DM gastroparesis with nausea/vomiting   ESRD on dialysis Anemia due to the above  Discussed the management with the care team

## 2020-06-07 NOTE — Progress Notes (Signed)
PROGRESS NOTE  [name] MPN:[id number] DOB: 1961/01/10 DOA: 06/02/2020 PCP: [facility]  HPI/Recap of past 9 hours: 59 year old female past medical history for end-stage renal disease on hemodialysis, hypertension, diabetes mellitus, CAD, COPD, PVD admitted on 9/11 after coming in with complaints of chills, vomiting, shortness of breath and chest pain and found to be hypoxic.  Patient found to have new onset pulmonary edema, due to volume overload as well as COPD exacerbation.  Had recently missed dialysis.  Procalcitonin also elevated and patient thought to also have possible pneumonia.  Patient admitted to the hospitalist service and nephrology consulted.  Patient has continued receiving dialysis.  VQ scan done ruled out PE.  On 9/16, blood cultures positive for Enterococcus.  Infectious disease consulted.  2D echocardiogram ordered with results pending to rule out endocarditis.  Patient herself able to wean off of oxygen.  She is quite distressed, complaining of right-sided rib pain when she moves.  Denies shortness of breath.  She is quite tearful and stressed out, saying that she just wants to stop being interrupted.  Assessment/Plan: Principal Problem: Acute respiratory failure secondary to acute diastolic CHF and COPD exacerbation: Sepsis ruled out.  CHF volume is managed by hemodialysis.  Patient continue with nebulizers, supplemental oxygen.  In discussion with infectious disease, is not really felt that she has pneumonia.  With discovery of Enterococcus, antibiotics adjusted from Rocephin and Zithromax to Zosyn.  With sensitivities, will likely just Zosyn to ampicillin.  Procalcitonin not very reliable in end-stage renal disease.  Awaiting echocardiogram results, possibly might need TEE    Anemia of chronic disease    ESRD on hemodialysis [facility]): Followed by nephrology, continued on hemodialysis.  Volume is better, off of oxygen    Benign essential HTN:  Initially hypertensive urgency, blood pressure still elevated, better following dialysis.  Continued on home medications.  Obesity: Patient meets criteria BMI greater than 30  Right-sided rib pain: Could be bruised ribs from coughing.  Could also potentially be fluid, checking chest x-ray.   Code Status: Full code  Family Communication: Updated son by phone  Disposition Plan: Anticipate discharge in a few days on proper antibiotic course for Enterococcus, endocarditis ruled out and source discovered.   Consultants:  Nephrology  Procedures:  VQ scan: Low probability of PE  Multiple dialysis sessions  Echocardiogram-results pending  Antimicrobials:  IV Rocephin and Zithromax 9/11-9/15  IV Zosyn 9/15-9/16  Ampicillin 9/16-present  DVT prophylaxis: Subcu heparin   Objective: Vitals:   06/06/20 1951 06/07/20 0428  BP: (!) 116/51 (!) 133/52  Pulse: 63 63  Resp: 14 16  Temp: 98.6 F (37 C) 98 F (36.7 C)  SpO2: 100% 95%    Intake/Output Summary (Last 24 hours) at 06/07/2020 1012 Last data filed at 06/07/2020 1000 Gross per 24 hour  Intake 863.78 ml  Output --  Net 863.78 ml   Filed Weights   06/02/20 1056 06/05/20 0500 06/06/20 0800  Weight: 80.7 kg 82 kg 89.6 kg   Body mass index is 32.87 kg/m.  Exam:   General: Alert and oriented x3, tearful  HEENT: Cephalic and atraumatic, mucous membranes are slightly dry  Cardiovascular: Regular rate and rhythm, S1-S2, borderline bradycardia  Respiratory: Decreased breath sounds throughout  Abdomen: Soft, nontender, nondistended, positive bowel sounds  Musculoskeletal: No clubbing or cyanosis or edema.  Tenderness in the right anterior and side ribs.  Skin: No skin breaks, tears or lesions  Psychiatry: Appropriate, no evidence of psychoses  Data Reviewed: CBC: Recent Labs  Lab 06/02/20 1907 06/03/20 0744 06/04/20 0658 06/05/20 1325 06/06/20 0612  WBC 5.4 8.1 3.3* 8.3 5.7  NEUTROABS 2.0  --    --   --   --   HGB 8.9* 8.0* 7.7* 8.2* 8.2*  HCT 26.5* 24.9* 23.8* 27.0* 25.2*  MCV 88.6 90.2 89.1 93.8 89.7  PLT 130* 118* 71* 77* 78*   Basic Metabolic Panel: Recent Labs  Lab 06/02/20 1907 06/03/20 0744 06/04/20 0658 06/05/20 1325 06/06/20 0612  NA 137  --  136 134* 131*  K 4.2  --  3.7 3.8 3.7  CL 98  --  97* 93* 93*  CO2 28  --  30 27 27   GLUCOSE 132*  --  98 268* 239*  BUN 25*  --  17 15 18   CREATININE 6.68* 4.56* 4.80* 4.30* 5.03*  CALCIUM 8.7*  --  7.8* 7.8* 7.8*   GFR: Estimated Creatinine Clearance: 13 mL/min (A) (by C-G formula based on SCr of 5.03 mg/dL (H)). Liver Function Tests: No results for input(s): AST, ALT, ALKPHOS, BILITOT, PROT, ALBUMIN in the last 168 hours. No results for input(s): LIPASE, AMYLASE in the last 168 hours. No results for input(s): AMMONIA in the last 168 hours. Coagulation Profile: No results for input(s): INR, PROTIME in the last 168 hours. Cardiac Enzymes: No results for input(s): CKTOTAL, CKMB, CKMBINDEX, TROPONINI in the last 168 hours. BNP (last 3 results) No results for input(s): PROBNP in the last 8760 hours. HbA1C: No results for input(s): HGBA1C in the last 72 hours. CBG: Recent Labs  Lab 06/06/20 1526 06/06/20 1914 06/06/20 2330 06/07/20 0426 06/07/20 0752  GLUCAP 141* 192* 170* 178* 155*   Lipid Profile: No results for input(s): CHOL, HDL, LDLCALC, TRIG, CHOLHDL, LDLDIRECT in the last 72 hours. Thyroid Function Tests: No results for input(s): TSH, T4TOTAL, FREET4, T3FREE, THYROIDAB in the last 72 hours. Anemia Panel: No results for input(s): VITAMINB12, FOLATE, FERRITIN, TIBC, IRON, RETICCTPCT in the last 72 hours. Urine analysis:    Component Value Date/Time   COLORURINE YELLOW (A) 06/09/2017 1039   APPEARANCEUR CLEAR (A) 06/09/2017 1039   APPEARANCEUR Clear 09/15/2014 0947   LABSPEC 1.014 06/09/2017 1039   LABSPEC 1.008 09/15/2014 0947   PHURINE 8.0 06/09/2017 1039   GLUCOSEU 150 (A) 06/09/2017 1039    GLUCOSEU 150 mg/dL 09/15/2014 0947   HGBUR SMALL (A) 06/09/2017 1039   BILIRUBINUR NEGATIVE 06/09/2017 1039   BILIRUBINUR Negative 09/15/2014 0947   KETONESUR NEGATIVE 06/09/2017 1039   PROTEINUR >=300 (A) 06/09/2017 1039   UROBILINOGEN 0.2 06/30/2010 2130   NITRITE NEGATIVE 06/09/2017 1039   LEUKOCYTESUR NEGATIVE 06/09/2017 1039   LEUKOCYTESUR Negative 09/15/2014 0947   Sepsis Labs: @LABRCNTIP (procalcitonin:4,lacticidven:4)  ) Recent Results (from the past 240 hour(s))  SARS Coronavirus 2 by RT PCR (hospital order, performed in [place] hospital lab) Nasopharyngeal Nasopharyngeal Swab     Status: None   Collection Time: 06/03/20  6:49 AM   Specimen: Nasopharyngeal Swab  Result Value Ref Range Status   SARS Coronavirus 2 NEGATIVE NEGATIVE Final    Comment: (NOTE) SARS-CoV-2 target nucleic acids are NOT DETECTED.  The SARS-CoV-2 RNA is generally detectable in upper and lower respiratory specimens during the acute phase of infection. The lowest concentration of SARS-CoV-2 viral copies this assay can detect is 250 copies / mL. A negative result does not preclude SARS-CoV-2 infection and should not be used as the sole basis for treatment or other patient management decisions.  A negative result  may occur with improper specimen collection / handling, submission of specimen other than nasopharyngeal swab, presence of viral mutation(s) within the areas targeted by this assay, and inadequate number of viral copies (<250 copies / mL). A negative result must be combined with clinical observations, patient history, and epidemiological information.  Fact Sheet for Patients:   StrictlyIdeas.no  Fact Sheet for Healthcare Providers: BankingDealers.co.za  This test is not yet approved or  cleared by the [place] FDA and has been authorized for detection and/or diagnosis of SARS-CoV-2 by FDA under an Emergency Use Authorization (EUA).   This EUA will remain in effect (meaning this test can be used) for the duration of the COVID-19 declaration under Section 564(b)(1) of the Act, 21 U.S.C. section 360bbb-3(b)(1), unless the authorization is terminated or revoked sooner.  Performed at [facility], [address] [place], [place] [address]   Gastrointestinal Panel by PCR , Stool     Status: None   Collection Time: 06/04/20  7:39 PM   Specimen: Stool  Result Value Ref Range Status   Campylobacter species NOT DETECTED NOT DETECTED Final   Plesimonas shigelloides NOT DETECTED NOT DETECTED Final   Salmonella species NOT DETECTED NOT DETECTED Final   Yersinia enterocolitica NOT DETECTED NOT DETECTED Final   Vibrio species NOT DETECTED NOT DETECTED Final   Vibrio cholerae NOT DETECTED NOT DETECTED Final   Enteroaggregative E coli (EAEC) NOT DETECTED NOT DETECTED Final   Enteropathogenic E coli (EPEC) NOT DETECTED NOT DETECTED Final   Enterotoxigenic E coli (ETEC) NOT DETECTED NOT DETECTED Final   Shiga like toxin producing E coli (STEC) NOT DETECTED NOT DETECTED Final   Shigella/Enteroinvasive E coli (EIEC) NOT DETECTED NOT DETECTED Final   Cryptosporidium NOT DETECTED NOT DETECTED Final   Cyclospora cayetanensis NOT DETECTED NOT DETECTED Final   Entamoeba histolytica NOT DETECTED NOT DETECTED Final   Giardia lamblia NOT DETECTED NOT DETECTED Final   Adenovirus F40/41 NOT DETECTED NOT DETECTED Final   Astrovirus NOT DETECTED NOT DETECTED Final   Norovirus GI/GII NOT DETECTED NOT DETECTED Final   Rotavirus A NOT DETECTED NOT DETECTED Final   Sapovirus (I, II, IV, and V) NOT DETECTED NOT DETECTED Final    Comment: Performed at [facility], [address] [place], [place] [address]  Culture, blood (Routine X 2) w Reflex to ID Panel     Status: None (Preliminary result)   Collection Time: 06/04/20  8:49 PM   Specimen: BLOOD  Result Value Ref Range Status   Specimen Description BLOOD BLOOD LEFT FOREARM   Final   Special Requests   Final    BOTTLES DRAWN AEROBIC AND ANAEROBIC Blood Culture adequate volume   Culture  Setup Time   Final    ANAEROBIC BOTTLE ONLY GRAM POSITIVE COCCI Organism ID to follow CRITICAL RESULT CALLED TO, READ BACK BY AND VERIFIED WITH: CHARLES SHANLEVER @1636  06/06/20 MJU Performed at [facility], [address] [place], [place] [address]    Culture [facility] POSITIVE COCCI  Final   Report Status PENDING  Incomplete  Blood Culture ID Panel (Reflexed)     Status: Abnormal   Collection Time: 06/04/20  8:49 PM  Result Value Ref Range Status   Enterococcus faecalis DETECTED (A) NOT DETECTED Final    Comment: CRITICAL RESULT CALLED TO, READ BACK BY AND VERIFIED WITH: CHARLES SHANLEVER @1636  06/06/20 MJU    Enterococcus Faecium NOT DETECTED NOT DETECTED Final   Listeria monocytogenes NOT DETECTED NOT DETECTED Final  Staphylococcus species NOT DETECTED NOT DETECTED Final   Staphylococcus aureus (BCID) NOT DETECTED NOT DETECTED Final   Staphylococcus epidermidis NOT DETECTED NOT DETECTED Final   Staphylococcus lugdunensis NOT DETECTED NOT DETECTED Final   Streptococcus species NOT DETECTED NOT DETECTED Final   Streptococcus agalactiae NOT DETECTED NOT DETECTED Final   Streptococcus pneumoniae NOT DETECTED NOT DETECTED Final   Streptococcus pyogenes NOT DETECTED NOT DETECTED Final   A.calcoaceticus-baumannii NOT DETECTED NOT DETECTED Final   Bacteroides fragilis NOT DETECTED NOT DETECTED Final   Enterobacterales NOT DETECTED NOT DETECTED Final   Enterobacter cloacae complex NOT DETECTED NOT DETECTED Final   Escherichia coli NOT DETECTED NOT DETECTED Final   Klebsiella aerogenes NOT DETECTED NOT DETECTED Final   Klebsiella oxytoca NOT DETECTED NOT DETECTED Final   Klebsiella pneumoniae NOT DETECTED NOT DETECTED Final   Proteus species NOT DETECTED NOT DETECTED Final   Salmonella species NOT DETECTED NOT DETECTED Final   Serratia marcescens NOT DETECTED NOT  DETECTED Final   Haemophilus influenzae NOT DETECTED NOT DETECTED Final   Neisseria meningitidis NOT DETECTED NOT DETECTED Final   Pseudomonas aeruginosa NOT DETECTED NOT DETECTED Final   Stenotrophomonas maltophilia NOT DETECTED NOT DETECTED Final   Candida albicans NOT DETECTED NOT DETECTED Final   Candida auris NOT DETECTED NOT DETECTED Final   Candida glabrata NOT DETECTED NOT DETECTED Final   Candida krusei NOT DETECTED NOT DETECTED Final   Candida parapsilosis NOT DETECTED NOT DETECTED Final   Candida tropicalis NOT DETECTED NOT DETECTED Final   Cryptococcus neoformans/gattii NOT DETECTED NOT DETECTED Final   Vancomycin resistance NOT DETECTED NOT DETECTED Final    Comment: Performed at [facility], [address] [place], [place] [address]  Culture, blood (Routine X 2) w Reflex to ID Panel     Status: None (Preliminary result)   Collection Time: 06/05/20  1:25 PM   Specimen: BLOOD  Result Value Ref Range Status   Specimen Description BLOOD LAC  Final   Special Requests   Final    BOTTLES DRAWN AEROBIC AND ANAEROBIC Blood Culture adequate volume   Culture   Final    NO GROWTH 2 DAYS Performed at [facility], [address], [place], [place] [address]    Report Status PENDING  Incomplete      Studies: No results found.  Scheduled Meds: . atorvastatin  20 mg Oral Daily  . carvedilol  6.25 mg Oral BID WC  . Chlorhexidine Gluconate Cloth  6 each Topical Daily  . epoetin (EPOGEN/PROCRIT) injection  10,000 Units Intravenous Q M,W,F-HD  . furosemide  80 mg Oral Daily  . heparin  5,000 Units Subcutaneous Q8H  . lipase/protease/amylase  12,000 Units Oral TID WC  .  morphine injection  4 mg Intravenous Once  . sevelamer carbonate  800 mg Oral TID WC    Continuous Infusions: . piperacillin-tazobactam (ZOSYN)  IV 2.25 g (06/07/20 0616)     LOS: 4 days     Annita Brod, MD [facility]   06/07/2020, 10:12 AM

## 2020-06-07 NOTE — Progress Notes (Signed)
*  PRELIMINARY RESULTS* Echocardiogram 2D Echocardiogram has been performed.  Sherrie Sport 06/07/2020, 9:36 AM

## 2020-06-07 NOTE — Consult Note (Signed)
Pharmacy Antibiotic Note  [name] is a 59 y.o. female with past medical history for ESRD on HD, HTN, DM, CAD, COPD, PVD  admitted on 06/02/2020 with respiratory failure secondary to acute diastolic CHF and HCAP and COPD exacerbation .  Pharmacy has been consulted for Zosyn per Blood culture result  Plan:  Restart Zosyn 2.25gm IV q8h over 32min for ESRD.  Changed to ampicillin this morning for enterococcus faecalis per BCID,  The 9/14 blood culture has Gram positive Coccobaccil  And  Gram negative Coccobacilli. Called micro to confirm the latter finding.  They will review gram stain slide when arrives from [facility] (Unable to contact anyone at [facility] lab)  Plan to change back to ampicillin once get clarification of gram stain   Height: 5\' 5"  (165.1 cm) Weight: 89.6 kg (197 lb 8.5 oz) IBW/kg (Calculated) : 57  Temp (24hrs), Avg:98.4 F (36.9 C), Min:98 F (36.7 C), Max:98.6 F (37 C)  Recent Labs  Lab 06/02/20 1907 06/03/20 0744 06/04/20 0658 06/05/20 1325 06/06/20 0612  WBC 5.4 8.1 3.3* 8.3 5.7  CREATININE 6.68* 4.56* 4.80* 4.30* 5.03*    Estimated Creatinine Clearance: 13 mL/min (A) (by C-G formula based on SCr of 5.03 mg/dL (H)).    Antimicrobials this admission: azithromycin 9/12 >> 9/15 ceftriaxone 9/12 >> 9/15 Zosyn 9/15 >>  Microbiology results: 9/13 Bcx: gram positive coccobacillus, GPC BCID E. faecalis 9/14 BCx: gram positive coccobacillus, gram negative coccobacillus. BCID E. faecalis 9/13 GI panel: negative 9/12 SARS CoV-2: negative   Thank you for allowing pharmacy to be a part of this patient's care.  Doreene Eland, PharmD, BCPS.   Work Cell: [phone] 06/07/2020 5:11 PM

## 2020-06-08 ENCOUNTER — Encounter: Payer: Self-pay | Admitting: Internal Medicine

## 2020-06-08 ENCOUNTER — Ambulatory Visit: Payer: Medicare HMO | Admitting: Family

## 2020-06-08 LAB — CULTURE, BLOOD (ROUTINE X 2)
Special Requests: ADEQUATE
Special Requests: ADEQUATE

## 2020-06-08 LAB — GLUCOSE, CAPILLARY
Glucose-Capillary: 163 mg/dL — ABNORMAL HIGH (ref 70–99)
Glucose-Capillary: 166 mg/dL — ABNORMAL HIGH (ref 70–99)
Glucose-Capillary: 190 mg/dL — ABNORMAL HIGH (ref 70–99)
Glucose-Capillary: 127 mg/dL — ABNORMAL HIGH (ref 70–99)
Glucose-Capillary: 127 mg/dL — ABNORMAL HIGH (ref 70–99)

## 2020-06-08 LAB — PHOSPHORUS: Phosphorus: 1.9 mg/dL — ABNORMAL LOW (ref 2.5–4.6)

## 2020-06-08 MED ORDER — GENTAMICIN IN SALINE 1-0.9 MG/ML-% IV SOLN
100.0000 mg | Freq: Once | INTRAVENOUS | Status: DC
  Filled 2020-06-08 (×2): qty 100

## 2020-06-08 MED ORDER — GENTAMICIN IN SALINE 1.6-0.9 MG/ML-% IV SOLN
80.0000 mg | INTRAVENOUS | Status: DC
  Filled 2020-06-08: qty 50

## 2020-06-08 MED ORDER — GENTAMICIN SULFATE 40 MG/ML IJ SOLN
100.0000 mg | Freq: Once | INTRAVENOUS | Status: AC
  Administered 2020-06-08: 100 mg via INTRAVENOUS
  Filled 2020-06-08: qty 2.5

## 2020-06-08 MED ORDER — GENTAMICIN IN SALINE 1-0.9 MG/ML-% IV SOLN
100.0000 mg | Freq: Once | INTRAVENOUS | Status: DC
  Filled 2020-06-08: qty 100

## 2020-06-08 MED ORDER — SODIUM CHLORIDE 0.9 % IV SOLN
2.0000 g | Freq: Two times a day (BID) | INTRAVENOUS | Status: DC
  Administered 2020-06-08 – 2020-06-22 (×28): 2 g via INTRAVENOUS
  Filled 2020-06-08 (×3): qty 2000
  Filled 2020-06-08: qty 2
  Filled 2020-06-08: qty 2000
  Filled 2020-06-08: qty 2
  Filled 2020-06-08 (×10): qty 2000
  Filled 2020-06-08 (×2): qty 2
  Filled 2020-06-08: qty 2000
  Filled 2020-06-08: qty 2
  Filled 2020-06-08: qty 2000
  Filled 2020-06-08: qty 2
  Filled 2020-06-08 (×2): qty 2000
  Filled 2020-06-08: qty 2
  Filled 2020-06-08: qty 2000
  Filled 2020-06-08 (×3): qty 2
  Filled 2020-06-08: qty 2000
  Filled 2020-06-08 (×5): qty 2

## 2020-06-08 NOTE — Progress Notes (Addendum)
Progress Note    [name]  KZL:[id number] DOB: 1961-03-01  DOA: 06/02/2020 PCP[name]      Brief Narrative:    Medical records reviewed and are as summarized below:  [name] is a 59 y.o. female with past medical history for end-stage renal disease on hemodialysis, hypertension, diabetes mellitus, CAD, COPD, PVD admitted on 9/11 after coming in with complaints of chills, vomiting, shortness of breath and chest pain and found to be hypoxic.  Patient found to have new onset pulmonary edema, due to volume overload as well as COPD exacerbation.  Had recently missed dialysis.  Procalcitonin also elevated and patient thought to also have possible pneumonia.  Patient admitted to the hospitalist service and nephrology consulted.  Patient has continued receiving dialysis.  VQ scan done ruled out PE.     Assessment/Plan:   Principal Problem:   Bacteremia due to Enterococcus Active Problems:   Acute respiratory failure with hypoxia (HCC)   Anemia of chronic disease   Acute diastolic CHF (congestive heart failure) (HCC)   COPD (chronic obstructive pulmonary disease) (HCC)   ESRD on hemodialysis (HCC)   Benign essential HTN   Obesity (BMI 30-39.9)  Body mass index is 32.87 kg/m.  (Obesity) Swelling on distal aspect of right arm/right elbow around right arm AV graft.   PLAN  Continue IV antibiotics for Enterococcus bacteremia Continue antihypertensives Vascular surgeon was consulted to evaluate swelling around right arm AV graft.  Arterial duplex of the right upper extremity has been ordered for further evaluation. Continue bronchodilators as needed for COPD Continue Creon for chronic pancreatitis Follow-up with nephrologist for hemodialysis Consult cardiologist with TEE   Diet Order            Diet renal with fluid restriction Fluid restriction: 1200 mL Fluid; Room service appropriate? Yes; Fluid consistency: Thin  Diet effective now                     Consultants:  Nephrologist  ID specialist       Medications:   . aspirin EC  81 mg Oral Daily  . atorvastatin  20 mg Oral Daily  . carvedilol  6.25 mg Oral BID WC  . Chlorhexidine Gluconate Cloth  6 each Topical Daily  . cholestyramine light  4 g Oral BID  . epoetin (EPOGEN/PROCRIT) injection  10,000 Units Intravenous Q M,W,F-HD  . ferrous sulfate  325 mg Oral Daily  . furosemide  80 mg Oral Daily  . gabapentin  100 mg Oral QPM  . heparin  5,000 Units Subcutaneous Q8H  . hydrOXYzine  25 mg Oral Daily  . lipase/protease/amylase  12,000 Units Oral TID WC  .  morphine injection  4 mg Intravenous Once  . multivitamin  1 tablet Oral Daily  . sevelamer carbonate  800 mg Oral TID WC  . cyanocobalamin  1,000 mcg Oral Daily   Continuous Infusions: . ampicillin (OMNIPEN) IV 2 g (06/08/20 1702)  . gentamicin    . [START ON 06/11/2020] gentamicin       Anti-infectives (From admission, onward)   Start     Dose/Rate Route Frequency Ordered Stop   06/11/20 1200  gentamicin (GARAMYCIN) IVPB 80 mg        80 mg 100 mL/hr over 30 Minutes Intravenous Every M-W-F (Hemodialysis) 06/08/20 1356     06/08/20 1800  gentamicin (GARAMYCIN) IVPB 100 mg  Status:  Discontinued  100 mg 200 mL/hr over 30 Minutes Intravenous  Once 06/08/20 1356 06/08/20 1425   06/08/20 1800  gentamicin (GARAMYCIN) IVPB 100 mg  Status:  Discontinued        100 mg 200 mL/hr over 30 Minutes Intravenous  Once 06/08/20 1425 06/08/20 1430   06/08/20 1800  gentamicin (GARAMYCIN) IVPB 100 mg  Status:  Discontinued        100 mg 200 mL/hr over 30 Minutes Intravenous  Once 06/08/20 1430 06/08/20 1441   06/08/20 1800  gentamicin (GARAMYCIN) 100 mg in dextrose 5 % 50 mL IVPB        100 mg 105 mL/hr over 30 Minutes Intravenous  Once 06/08/20 1441     06/08/20 1500  ampicillin (OMNIPEN) 2 g in sodium chloride 0.9 % 100 mL IVPB        2 g 300 mL/hr over 20 Minutes Intravenous Every 12 hours  06/08/20 1356     06/07/20 2200  piperacillin-tazobactam (ZOSYN) IVPB 2.25 g  Status:  Discontinued        2.25 g 100 mL/hr over 30 Minutes Intravenous Every 8 hours 06/07/20 1657 06/08/20 1010   06/07/20 1400  ampicillin (OMNIPEN) 2 g in sodium chloride 0.9 % 100 mL IVPB  Status:  Discontinued        2 g 300 mL/hr over 20 Minutes Intravenous Every 8 hours 06/07/20 1025 06/07/20 1637   06/06/20 1600  piperacillin-tazobactam (ZOSYN) IVPB 2.25 g  Status:  Discontinued        2.25 g 100 mL/hr over 30 Minutes Intravenous Every 8 hours 06/06/20 1403 06/07/20 1025   06/03/20 1400  azithromycin (ZITHROMAX) 500 mg in sodium chloride 0.9 % 250 mL IVPB  Status:  Discontinued        500 mg 250 mL/hr over 60 Minutes Intravenous Every 24 hours 06/03/20 1322 06/06/20 1319   06/03/20 1400  cefTRIAXone (ROCEPHIN) 1 g in sodium chloride 0.9 % 100 mL IVPB  Status:  Discontinued        1 g 200 mL/hr over 30 Minutes Intravenous Every 24 hours 06/03/20 1322 06/06/20 1319             Family Communication/Anticipated D/C date and plan/Code Status   DVT prophylaxis: heparin injection 5,000 Units Start: 06/02/20 2300     Code Status: Full Code  Family Communication: Plan discussed with patient Disposition Plan:    Status is: Inpatient  Remains inpatient appropriate because:IV treatments appropriate due to intensity of illness or inability to take PO   Dispo: The patient is from: Home              Anticipated d/c is to: Home              Anticipated d/c date is: > 3 days              Patient currently is not medically stable to d/c.           Subjective:   C/o a knot on right elbow around site of AV graft.  Objective:    Vitals:   06/07/20 1205 06/07/20 2200 06/08/20 0507 06/08/20 1555  BP: (!) 160/50 (!) 139/43 (!) 145/55 (!) 174/58  Pulse: 61 (!) 58 (!) 55 (!) 59  Resp: 16  16 18   Temp: 98.4 F (36.9 C) 98.3 F (36.8 C) 97.7 F (36.5 C) 98.6 F (37 C)  TempSrc: Oral  Oral Oral Oral  SpO2: 93% 97% 97% 97%  Weight:  Height:       No data found.  No intake or output data in the 24 hours ending 06/08/20 1746 Filed Weights   06/02/20 1056 06/05/20 0500 06/06/20 0800  Weight: 80.7 kg 82 kg 89.6 kg    Exam:  GEN: NAD SKIN: No rash.  Swelling Audible bruit and palpable thrill on right arm AV graft EYES: EOMI ENT: MMM CV: RRR PULM: CTA B ABD: soft, ND, NT, +BS CNS: AAO x 3, non focal EXT: Lump on medial aspect of right elbow/distal right arm.  This area is mildly tender but not erythematous.  No lower extremity swelling or tenderness   Data Reviewed:   I have personally reviewed following labs and imaging studies:  Labs: Labs show the following:   Basic Metabolic Panel: Recent Labs  Lab 06/02/20 1907 06/02/20 1907 06/03/20 0744 06/04/20 0658 06/04/20 0658 06/05/20 1325 06/06/20 0612  NA 137  --   --  136  --  134* 131*  K 4.2   < >  --  3.7   < > 3.8 3.7  CL 98  --   --  97*  --  93* 93*  CO2 28  --   --  30  --  27 27  GLUCOSE 132*  --   --  98  --  268* 239*  BUN 25*  --   --  17  --  15 18  CREATININE 6.68*  --  4.56* 4.80*  --  4.30* 5.03*  CALCIUM 8.7*  --   --  7.8*  --  7.8* 7.8*   < > = values in this interval not displayed.   GFR Estimated Creatinine Clearance: 13 mL/min (A) (by C-G formula based on SCr of 5.03 mg/dL (H)). Liver Function Tests: No results for input(s): AST, ALT, ALKPHOS, BILITOT, PROT, ALBUMIN in the last 168 hours. No results for input(s): LIPASE, AMYLASE in the last 168 hours. No results for input(s): AMMONIA in the last 168 hours. Coagulation profile No results for input(s): INR, PROTIME in the last 168 hours.  CBC: Recent Labs  Lab 06/02/20 1907 06/03/20 0744 06/04/20 0658 06/05/20 1325 06/06/20 0612  WBC 5.4 8.1 3.3* 8.3 5.7  NEUTROABS 2.0  --   --   --   --   HGB 8.9* 8.0* 7.7* 8.2* 8.2*  HCT 26.5* 24.9* 23.8* 27.0* 25.2*  MCV 88.6 90.2 89.1 93.8 89.7  PLT 130* 118* 71* 77* 78*    Cardiac Enzymes: No results for input(s): CKTOTAL, CKMB, CKMBINDEX, TROPONINI in the last 168 hours. BNP (last 3 results) No results for input(s): PROBNP in the last 8760 hours. CBG: Recent Labs  Lab 06/07/20 2338 06/08/20 0506 06/08/20 0827 06/08/20 1239 06/08/20 1552  GLUCAP 138* 163* 166* 190* 127*   D-Dimer: No results for input(s): DDIMER in the last 72 hours. Hgb A1c: No results for input(s): HGBA1C in the last 72 hours. Lipid Profile: No results for input(s): CHOL, HDL, LDLCALC, TRIG, CHOLHDL, LDLDIRECT in the last 72 hours. Thyroid function studies: No results for input(s): TSH, T4TOTAL, T3FREE, THYROIDAB in the last 72 hours.  Invalid input(s): FREET3 Anemia work up: No results for input(s): VITAMINB12, FOLATE, FERRITIN, TIBC, IRON, RETICCTPCT in the last 72 hours. Sepsis Labs: Recent Labs  Lab 06/03/20 0744 06/03/20 1404 06/04/20 0658 06/05/20 1325 06/06/20 0612  PROCALCITON  --  20.16  --   --  22.77  WBC 8.1  --  3.3* 8.3 5.7    Microbiology Recent Results (from  the past 240 hour(s))  SARS Coronavirus 2 by RT PCR (hospital order, performed in [facility] hospital lab) Nasopharyngeal Nasopharyngeal Swab     Status: None   Collection Time: 06/03/20  6:49 AM   Specimen: Nasopharyngeal Swab  Result Value Ref Range Status   SARS Coronavirus 2 NEGATIVE NEGATIVE Final    Comment: (NOTE) SARS-CoV-2 target nucleic acids are NOT DETECTED.  The SARS-CoV-2 RNA is generally detectable in upper and lower respiratory specimens during the acute phase of infection. The lowest concentration of SARS-CoV-2 viral copies this assay can detect is 250 copies / mL. A negative result does not preclude SARS-CoV-2 infection and should not be used as the sole basis for treatment or other patient management decisions.  A negative result may occur with improper specimen collection / handling, submission of specimen other than nasopharyngeal swab, presence of viral mutation(s)  within the areas targeted by this assay, and inadequate number of viral copies (<250 copies / mL). A negative result must be combined with clinical observations, patient history, and epidemiological information.  Fact Sheet for Patients:   StrictlyIdeas.no  Fact Sheet for Healthcare Providers: BankingDealers.co.za  This test is not yet approved or  cleared by the [place] FDA and has been authorized for detection and/or diagnosis of SARS-CoV-2 by FDA under an Emergency Use Authorization (EUA).  This EUA will remain in effect (meaning this test can be used) for the duration of the COVID-19 declaration under Section 564(b)(1) of the Act, 21 U.S.C. section 360bbb-3(b)(1), unless the authorization is terminated or revoked sooner.  Performed at [facility], [address] [place], [place] [address]   Gastrointestinal Panel by PCR , Stool     Status: None   Collection Time: 06/04/20  7:39 PM   Specimen: Stool  Result Value Ref Range Status   Campylobacter species NOT DETECTED NOT DETECTED Final   Plesimonas shigelloides NOT DETECTED NOT DETECTED Final   Salmonella species NOT DETECTED NOT DETECTED Final   Yersinia enterocolitica NOT DETECTED NOT DETECTED Final   Vibrio species NOT DETECTED NOT DETECTED Final   Vibrio cholerae NOT DETECTED NOT DETECTED Final   Enteroaggregative E coli (EAEC) NOT DETECTED NOT DETECTED Final   Enteropathogenic E coli (EPEC) NOT DETECTED NOT DETECTED Final   Enterotoxigenic E coli (ETEC) NOT DETECTED NOT DETECTED Final   Shiga like toxin producing E coli (STEC) NOT DETECTED NOT DETECTED Final   Shigella/Enteroinvasive E coli (EIEC) NOT DETECTED NOT DETECTED Final   Cryptosporidium NOT DETECTED NOT DETECTED Final   Cyclospora cayetanensis NOT DETECTED NOT DETECTED Final   Entamoeba histolytica NOT DETECTED NOT DETECTED Final   Giardia lamblia NOT DETECTED NOT DETECTED Final   Adenovirus  F40/41 NOT DETECTED NOT DETECTED Final   Astrovirus NOT DETECTED NOT DETECTED Final   Norovirus GI/GII NOT DETECTED NOT DETECTED Final   Rotavirus A NOT DETECTED NOT DETECTED Final   Sapovirus (I, II, IV, and V) NOT DETECTED NOT DETECTED Final    Comment: Performed at [facility], [address] [place], [place] [address]  Culture, blood (Routine X 2) w Reflex to ID Panel     Status: Abnormal   Collection Time: 06/04/20  8:49 PM   Specimen: BLOOD  Result Value Ref Range Status   Specimen Description   Final    BLOOD BLOOD LEFT FOREARM Performed at [facility], [address], [place], [place] [address]    Special Requests   Final    BOTTLES DRAWN AEROBIC AND  ANAEROBIC Blood Culture adequate volume Performed at [facility], [address] [place], [place] [address]    Culture  Setup Time   Final    IN BOTH AEROBIC AND ANAEROBIC BOTTLES GRAM POSITIVE COCCI Organism ID to follow CRITICAL RESULT CALLED TO, READ BACK BY AND VERIFIED WITH: CHARLES SHANLEVER @1636  06/06/20 MJU LISA KLUTTZ AT 4481 ON 06/07/2020 [place]. Performed at [facility], [place] [address] [place], [place] [address]    Culture ENTEROCOCCUS FAECALIS (A)  Final   Report Status 06/08/2020 FINAL  Final   Organism ID, Bacteria ENTEROCOCCUS FAECALIS  Final      Susceptibility   Enterococcus faecalis - MIC*    AMPICILLIN <=2 SENSITIVE Sensitive     VANCOMYCIN 1 SENSITIVE Sensitive     GENTAMICIN SYNERGY SENSITIVE Sensitive     * ENTEROCOCCUS FAECALIS  Blood Culture ID Panel (Reflexed)     Status: Abnormal   Collection Time: 06/04/20  8:49 PM  Result Value Ref Range Status   Enterococcus faecalis DETECTED (A) NOT DETECTED Final    Comment: CRITICAL RESULT CALLED TO, READ BACK BY AND VERIFIED WITH: CHARLES SHANLEVER @1636  06/06/20 MJU    Enterococcus Faecium NOT DETECTED NOT DETECTED Final   Listeria monocytogenes NOT DETECTED NOT DETECTED Final   Staphylococcus species NOT DETECTED  NOT DETECTED Final   Staphylococcus aureus (BCID) NOT DETECTED NOT DETECTED Final   Staphylococcus epidermidis NOT DETECTED NOT DETECTED Final   Staphylococcus lugdunensis NOT DETECTED NOT DETECTED Final   Streptococcus species NOT DETECTED NOT DETECTED Final   Streptococcus agalactiae NOT DETECTED NOT DETECTED Final   Streptococcus pneumoniae NOT DETECTED NOT DETECTED Final   Streptococcus pyogenes NOT DETECTED NOT DETECTED Final   A.calcoaceticus-baumannii NOT DETECTED NOT DETECTED Final   Bacteroides fragilis NOT DETECTED NOT DETECTED Final   Enterobacterales NOT DETECTED NOT DETECTED Final   Enterobacter cloacae complex NOT DETECTED NOT DETECTED Final   Escherichia coli NOT DETECTED NOT DETECTED Final   Klebsiella aerogenes NOT DETECTED NOT DETECTED Final   Klebsiella oxytoca NOT DETECTED NOT DETECTED Final   Klebsiella pneumoniae NOT DETECTED NOT DETECTED Final   Proteus species NOT DETECTED NOT DETECTED Final   Salmonella species NOT DETECTED NOT DETECTED Final   Serratia marcescens NOT DETECTED NOT DETECTED Final   Haemophilus influenzae NOT DETECTED NOT DETECTED Final   Neisseria meningitidis NOT DETECTED NOT DETECTED Final   Pseudomonas aeruginosa NOT DETECTED NOT DETECTED Final   Stenotrophomonas maltophilia NOT DETECTED NOT DETECTED Final   Candida albicans NOT DETECTED NOT DETECTED Final   Candida auris NOT DETECTED NOT DETECTED Final   Candida glabrata NOT DETECTED NOT DETECTED Final   Candida krusei NOT DETECTED NOT DETECTED Final   Candida parapsilosis NOT DETECTED NOT DETECTED Final   Candida tropicalis NOT DETECTED NOT DETECTED Final   Cryptococcus neoformans/gattii NOT DETECTED NOT DETECTED Final   Vancomycin resistance NOT DETECTED NOT DETECTED Final    Comment: Performed at [facility], [address] [place], [place] [address]  Culture, blood (Routine X 2) w Reflex to ID Panel     Status: Abnormal   Collection Time: 06/05/20  1:25 PM   Specimen:  BLOOD  Result Value Ref Range Status   Specimen Description   Final    BLOOD LAC Performed at [facility], [address] [place], [place] [address]    Special Requests   Final    BOTTLES DRAWN AEROBIC AND ANAEROBIC Blood Culture adequate volume Performed at [facility],  [place], [place] [address]    Culture  Setup Time   Final    Organism ID to follow ANAEROBIC BOTTLE ONLY CRITICAL RESULT CALLED TO, READ BACK BY AND VERIFIED WITH: JASON ROBBINS 06/07/20 AT 1506 BY ACR GRAM POSITIVE COCCI PREVIOUSLY REPORTED AS: GRAM NEGATIVE COCCOBACILLI CORRECTED RESULTS CALLED TO: D,Ziegler PHARMD, at [place] 06/07/20 BY E. BENTON    Culture (A)  Final    ENTEROCOCCUS FAECALIS SUSCEPTIBILITIES PERFORMED ON PREVIOUS CULTURE WITHIN THE LAST 5 DAYS. Performed at [facility], [place] [address], [place], [place] [address]    Report Status 06/08/2020 FINAL  Final  Blood Culture ID Panel (Reflexed)     Status: Abnormal   Collection Time: 06/05/20  1:25 PM  Result Value Ref Range Status   Enterococcus faecalis DETECTED (A) NOT DETECTED Final    Comment: CRITICAL RESULT CALLED TO, READ BACK BY AND VERIFIED WITH: JASON ROBBINS 06/07/20 AT 1506 BY ACR    Enterococcus Faecium NOT DETECTED NOT DETECTED Final   Listeria monocytogenes NOT DETECTED NOT DETECTED Final   Staphylococcus species NOT DETECTED NOT DETECTED Final   Staphylococcus aureus (BCID) NOT DETECTED NOT DETECTED Final   Staphylococcus epidermidis NOT DETECTED NOT DETECTED Final   Staphylococcus lugdunensis NOT DETECTED NOT DETECTED Final   Streptococcus species NOT DETECTED NOT DETECTED Final   Streptococcus agalactiae NOT DETECTED NOT DETECTED Final   Streptococcus pneumoniae NOT DETECTED NOT DETECTED Final   Streptococcus pyogenes NOT DETECTED NOT DETECTED Final   A.calcoaceticus-baumannii NOT DETECTED NOT DETECTED Final   Bacteroides fragilis NOT DETECTED NOT DETECTED Final   Enterobacterales NOT  DETECTED NOT DETECTED Final   Enterobacter cloacae complex NOT DETECTED NOT DETECTED Final   Escherichia coli NOT DETECTED NOT DETECTED Final   Klebsiella aerogenes NOT DETECTED NOT DETECTED Final   Klebsiella oxytoca NOT DETECTED NOT DETECTED Final   Klebsiella pneumoniae NOT DETECTED NOT DETECTED Final   Proteus species NOT DETECTED NOT DETECTED Final   Salmonella species NOT DETECTED NOT DETECTED Final   Serratia marcescens NOT DETECTED NOT DETECTED Final   Haemophilus influenzae NOT DETECTED NOT DETECTED Final   Neisseria meningitidis NOT DETECTED NOT DETECTED Final   Pseudomonas aeruginosa NOT DETECTED NOT DETECTED Final   Stenotrophomonas maltophilia NOT DETECTED NOT DETECTED Final   Candida albicans NOT DETECTED NOT DETECTED Final   Candida auris NOT DETECTED NOT DETECTED Final   Candida glabrata NOT DETECTED NOT DETECTED Final   Candida krusei NOT DETECTED NOT DETECTED Final   Candida parapsilosis NOT DETECTED NOT DETECTED Final   Candida tropicalis NOT DETECTED NOT DETECTED Final   Cryptococcus neoformans/gattii NOT DETECTED NOT DETECTED Final   Vancomycin resistance NOT DETECTED NOT DETECTED Final    Comment: Performed at [facility], [address] [place], [place] [address]  CULTURE, BLOOD (ROUTINE X 2) w Reflex to ID Panel     Status: None (Preliminary result)   Collection Time: 06/07/20  7:02 PM   Specimen: BLOOD  Result Value Ref Range Status   Specimen Description BLOOD BLOOD LEFT FOREARM  Final   Special Requests   Final    BOTTLES DRAWN AEROBIC AND ANAEROBIC Blood Culture results may not be optimal due to an inadequate volume of blood received in culture bottles   Culture   Final    NO GROWTH < 12 HOURS Performed at [facility], [address] [place], [place] [address]    Report Status PENDING  Incomplete  CULTURE, BLOOD (ROUTINE X 2)  w Reflex to ID Panel     Status: None (Preliminary result)   Collection Time: 06/07/20  8:46 PM    Specimen: BLOOD  Result Value Ref Range Status   Specimen Description BLOOD BLOOD LEFT HAND  Final   Special Requests   Final    BOTTLES DRAWN AEROBIC ONLY Blood Culture results may not be optimal due to an inadequate volume of blood received in culture bottles   Culture   Final    NO GROWTH < 12 HOURS Performed at [facility] [address], [place], [place] [address]    Report Status PENDING  Incomplete    Procedures and diagnostic studies:  DG Chest Port 1 View  Result Date: 06/07/2020 CLINICAL DATA:  RIGHT-sided chest pain and rib pain for 5 days EXAM: PORTABLE CHEST 1 VIEW COMPARISON:  Chest x-ray 06/05/2020 FINDINGS: RIGHT-sided dialysis catheter terminates in the upper RIGHT atrium, no change from the previous study. Stenting of LEFT axillary and subclavian venous system also unchanged. Heart size remains enlarged. Lung volumes mild bibasilar airspace disease. No acute skeletal process on limited assessment. IMPRESSION: 1. Low lung volumes with mild bibasilar airspace disease similar to prior. 2. Mild interstitial prominence may reflect mild pulmonary edema though is similar to the previous evaluation. Findings remain improved when compared to 06/02/2020. 3. No acute findings in the chest. 4. Stable cardiomegaly. Electronically Signed   By: Zetta Bills M.D.   On: 06/07/2020 11:17   ECHOCARDIOGRAM COMPLETE  Result Date: 06/07/2020    ECHOCARDIOGRAM REPORT   Patient Name:   ZAKIRA RESSEL Date of Exam: 06/07/2020 Medical Rec #:  [id number]   Height:       65.0 in Accession #:    [id number]  Weight:       197.5 lb Date of Birth:  1961-07-27    BSA:          1.968 m Patient Age:    66 years    BP:           133/52 mmHg Patient Gender: F           HR:           63 bpm. Exam Location:  [facility] Procedure: 2D Echo, Cardiac Doppler and Color Doppler Indications:     Bacteremia 790.7  History:         Patient has prior history of Echocardiogram examinations, most                  recent  02/24/2020. Previous Myocardial Infarction, COPD; Risk                  Factors:Diabetes. Mitral Regurgitation.  Sonographer:     Sherrie Sport RDCS (AE) Referring Phys:  [id number] Tsosie Billing Diagnosing Phys: Bartholome Bill MD  Sonographer Comments: Suboptimal apical window. IMPRESSIONS  1. Left ventricular ejection fraction, by estimation, is 60 to 65%. The left ventricle has normal function. The left ventricle has no regional wall motion abnormalities. There is mild left ventricular hypertrophy. Left ventricular diastolic parameters are consistent with Grade I diastolic dysfunction (impaired relaxation).  2. Right ventricular systolic function was not well visualized. The right ventricular size is mildly enlarged.  3. Right atrial size was mildly dilated.  4. The mitral valve is grossly normal. Trivial mitral valve regurgitation.  5. Tricuspid valve regurgitation is moderate.  6. The aortic valve is tricuspid. Aortic valve regurgitation is not visualized. FINDINGS  Left Ventricle: Left ventricular ejection fraction, by estimation,  is 60 to 65%. The left ventricle has normal function. The left ventricle has no regional wall motion abnormalities. The left ventricular internal cavity size was normal in size. There is  mild left ventricular hypertrophy. Left ventricular diastolic parameters are consistent with Grade I diastolic dysfunction (impaired relaxation). Right Ventricle: The right ventricular size is mildly enlarged. Right vetricular wall thickness was not well visualized. Right ventricular systolic function was not well visualized. Left Atrium: Left atrial size was normal in size. Right Atrium: Right atrial size was mildly dilated. Pericardium: There is no evidence of pericardial effusion. Mitral Valve: The mitral valve is grossly normal. Trivial mitral valve regurgitation. Tricuspid Valve: The tricuspid valve is grossly normal. Tricuspid valve regurgitation is moderate. Aortic Valve: The aortic valve is  tricuspid. Aortic valve regurgitation is not visualized. Aortic valve mean gradient measures 5.0 mmHg. Aortic valve peak gradient measures 8.0 mmHg. Aortic valve area, by VTI measures 1.68 cm. Pulmonic Valve: The pulmonic valve was not well visualized. Pulmonic valve regurgitation is trivial. Aorta: The aortic root was not well visualized. IAS/Shunts: The interatrial septum was not assessed.  LEFT VENTRICLE PLAX 2D LVIDd:         4.60 cm  Diastology LVIDs:         2.89 cm  LV e' medial:    6.20 cm/s LV PW:         0.95 cm  LV E/e' medial:  17.3 LV IVS:        0.98 cm  LV e' lateral:   9.03 cm/s LVOT diam:     2.00 cm  LV E/e' lateral: 11.8 LV SV:         52 LV SV Index:   27 LVOT Area:     3.14 cm  RIGHT VENTRICLE RV Basal diam:  5.30 cm RV S prime:     12.00 cm/s TAPSE (M-mode): 3.5 cm LEFT ATRIUM             Index       RIGHT ATRIUM           Index LA diam:        3.70 cm 1.88 cm/m  RA Area:     29.00 cm LA Vol (A2C):   75.4 ml 38.32 ml/m RA Volume:   120.00 ml 60.98 ml/m LA Vol (A4C):   34.6 ml 17.58 ml/m LA Biplane Vol: 53.2 ml 27.04 ml/m  AORTIC VALVE                    PULMONIC VALVE AV Area (Vmax):    1.86 cm     PV Vmax:        0.59 m/s AV Area (Vmean):   1.65 cm     PV Peak grad:   1.4 mmHg AV Area (VTI):     1.68 cm     RVOT Peak grad: 2 mmHg AV Vmax:           141.00 cm/s AV Vmean:          103.350 cm/s AV VTI:            0.310 m AV Peak Grad:      8.0 mmHg AV Mean Grad:      5.0 mmHg LVOT Vmax:         83.60 cm/s LVOT Vmean:        54.200 cm/s LVOT VTI:          0.166 m LVOT/AV VTI ratio: 0.54  AORTA  Ao Root diam: 2.40 cm MITRAL VALVE                TRICUSPID VALVE MV Area (PHT): 3.60 cm     TR Peak grad:   60.2 mmHg MV Decel Time: 211 msec     TR Vmax:        388.00 cm/s MV E velocity: 107.00 cm/s MV A velocity: 104.00 cm/s  SHUNTS MV E/A ratio:  1.03         Systemic VTI:  0.17 m                             Systemic Diam: 2.00 cm Bartholome Bill MD Electronically signed by Bartholome Bill MD  Signature Date/Time: 06/07/2020/10:42:33 AM    Final                LOS: 5 days   Maninder Deboer  [facility]   Pager on [email or web address]. If 7PM-7AM, please contact night-coverage at [email or web address]     06/08/2020, 5:46 PM

## 2020-06-08 NOTE — Progress Notes (Signed)
[facility]  ROUNDING NOTE   Subjective:  Pt due for HD today. Small infiltration overlying her RUE HERO graft.    Objective:  Vital signs in last 24 hours:  Temp:  [97.7 F (36.5 C)-98.3 F (36.8 C)] 97.7 F (36.5 C) (09/17 0507) Pulse Rate:  [55-58] 55 (09/17 0507) Resp:  [16] 16 (09/17 0507) BP: (139-145)/(43-55) 145/55 (09/17 0507) SpO2:  [97 %] 97 % (09/17 0507)  Weight change:  Filed Weights   06/02/20 1056 06/05/20 0500 06/06/20 0800  Weight: 80.7 kg 82 kg 89.6 kg    Intake/Output: I/O last 3 completed shifts: In: 220 [P.O.:120; IV Piggyback:100] Out: -    Intake/Output this shift:  No intake/output data recorded.  Physical Exam: General:  No acute distress  Head:  Oral mucosal membranes moist  Eyes:  Anicteric  Neck:  Supple  Lungs:   Basilar rales, normal effort  Heart:  Regular, S1S2   Abdomen:   Soft, nontender, non distended  Extremities:  Trace peripheral edema.  Neurologic:  Able to follow commands  Skin:  No acute  Lesions or rashes  Access:  Right upper extremity HERO, small area of infiltration overlying inferior portion of access    Basic Metabolic Panel: Recent Labs  Lab 06/02/20 1907 06/02/20 1907 06/03/20 0744 06/04/20 0658 06/05/20 1325 06/06/20 0612  NA 137  --   --  136 134* 131*  K 4.2  --   --  3.7 3.8 3.7  CL 98  --   --  97* 93* 93*  CO2 28  --   --  30 27 27   GLUCOSE 132*  --   --  98 268* 239*  BUN 25*  --   --  17 15 18   CREATININE 6.68*  --  4.56* 4.80* 4.30* 5.03*  CALCIUM 8.7*   < >  --  7.8* 7.8* 7.8*   < > = values in this interval not displayed.    Liver Function Tests: No results for input(s): AST, ALT, ALKPHOS, BILITOT, PROT, ALBUMIN in the last 168 hours. No results for input(s): LIPASE, AMYLASE in the last 168 hours. No results for input(s): AMMONIA in the last 168 hours.  CBC: Recent Labs  Lab 06/02/20 1907 06/03/20 0744 06/04/20 0658 06/05/20 1325 06/06/20 0612  WBC 5.4 8.1 3.3*  8.3 5.7  NEUTROABS 2.0  --   --   --   --   HGB 8.9* 8.0* 7.7* 8.2* 8.2*  HCT 26.5* 24.9* 23.8* 27.0* 25.2*  MCV 88.6 90.2 89.1 93.8 89.7  PLT 130* 118* 71* 77* 78*    Cardiac Enzymes: No results for input(s): CKTOTAL, CKMB, CKMBINDEX, TROPONINI in the last 168 hours.  BNP: Invalid input(s): POCBNP  CBG: Recent Labs  Lab 06/07/20 2055 06/07/20 2338 06/08/20 0506 06/08/20 0827 06/08/20 1239  GLUCAP 133* 138* 163* 166* 190*    Microbiology: Results for orders placed or performed during the hospital encounter of 06/02/20  SARS Coronavirus 2 by RT PCR (hospital order, performed in [facility] hospital lab) Nasopharyngeal Nasopharyngeal Swab     Status: None   Collection Time: 06/03/20  6:49 AM   Specimen: Nasopharyngeal Swab  Result Value Ref Range Status   SARS Coronavirus 2 NEGATIVE NEGATIVE Final    Comment: (NOTE) SARS-CoV-2 target nucleic acids are NOT DETECTED.  The SARS-CoV-2 RNA is generally detectable in upper and lower respiratory specimens during the acute phase of infection. The lowest concentration of SARS-CoV-2 viral copies this assay can detect is  250 copies / mL. A negative result does not preclude SARS-CoV-2 infection and should not be used as the sole basis for treatment or other patient management decisions.  A negative result may occur with improper specimen collection / handling, submission of specimen other than nasopharyngeal swab, presence of viral mutation(s) within the areas targeted by this assay, and inadequate number of viral copies (<250 copies / mL). A negative result must be combined with clinical observations, patient history, and epidemiological information.  Fact Sheet for Patients:   StrictlyIdeas.no  Fact Sheet for Healthcare Providers: BankingDealers.co.za  This test is not yet approved or  cleared by the [place] FDA and has been authorized for detection and/or diagnosis of  SARS-CoV-2 by FDA under an Emergency Use Authorization (EUA).  This EUA will remain in effect (meaning this test can be used) for the duration of the COVID-19 declaration under Section 564(b)(1) of the Act, 21 U.S.C. section 360bbb-3(b)(1), unless the authorization is terminated or revoked sooner.  Performed at [facility], [address] [place], [place] [address]   Gastrointestinal Panel by PCR , Stool     Status: None   Collection Time: 06/04/20  7:39 PM   Specimen: Stool  Result Value Ref Range Status   Campylobacter species NOT DETECTED NOT DETECTED Final   Plesimonas shigelloides NOT DETECTED NOT DETECTED Final   Salmonella species NOT DETECTED NOT DETECTED Final   Yersinia enterocolitica NOT DETECTED NOT DETECTED Final   Vibrio species NOT DETECTED NOT DETECTED Final   Vibrio cholerae NOT DETECTED NOT DETECTED Final   Enteroaggregative E coli (EAEC) NOT DETECTED NOT DETECTED Final   Enteropathogenic E coli (EPEC) NOT DETECTED NOT DETECTED Final   Enterotoxigenic E coli (ETEC) NOT DETECTED NOT DETECTED Final   Shiga like toxin producing E coli (STEC) NOT DETECTED NOT DETECTED Final   Shigella/Enteroinvasive E coli (EIEC) NOT DETECTED NOT DETECTED Final   Cryptosporidium NOT DETECTED NOT DETECTED Final   Cyclospora cayetanensis NOT DETECTED NOT DETECTED Final   Entamoeba histolytica NOT DETECTED NOT DETECTED Final   Giardia lamblia NOT DETECTED NOT DETECTED Final   Adenovirus F40/41 NOT DETECTED NOT DETECTED Final   Astrovirus NOT DETECTED NOT DETECTED Final   Norovirus GI/GII NOT DETECTED NOT DETECTED Final   Rotavirus A NOT DETECTED NOT DETECTED Final   Sapovirus (I, II, IV, and V) NOT DETECTED NOT DETECTED Final    Comment: Performed at [facility], [address] [place], [place] [address]  Culture, blood (Routine X 2) w Reflex to ID Panel     Status: Abnormal   Collection Time: 06/04/20  8:49 PM   Specimen: BLOOD  Result Value Ref Range Status    Specimen Description   Final    BLOOD BLOOD LEFT FOREARM Performed at [facility], [address] [place], [place] [address]    Special Requests   Final    BOTTLES DRAWN AEROBIC AND ANAEROBIC Blood Culture adequate volume Performed at [facility], [address] [place], [place] [address]    Culture  Setup Time   Final    IN BOTH AEROBIC AND ANAEROBIC BOTTLES GRAM POSITIVE COCCI Organism ID to follow CRITICAL RESULT CALLED TO, READ BACK BY AND VERIFIED WITH: CHARLES SHANLEVER @1636  06/06/20 MJU LISA KLUTTZ AT 1019 ON 06/07/2020 Melvin. Performed at [facility], [place] [address], [place], [place] [address]    Culture ENTEROCOCCUS FAECALIS (A)  Final   Report Status 06/08/2020 FINAL  Final   Organism  ID, Bacteria ENTEROCOCCUS FAECALIS  Final      Susceptibility   Enterococcus faecalis - MIC*    AMPICILLIN <=2 SENSITIVE Sensitive     VANCOMYCIN 1 SENSITIVE Sensitive     GENTAMICIN SYNERGY SENSITIVE Sensitive     * ENTEROCOCCUS FAECALIS  Blood Culture ID Panel (Reflexed)     Status: Abnormal   Collection Time: 06/04/20  8:49 PM  Result Value Ref Range Status   Enterococcus faecalis DETECTED (A) NOT DETECTED Final    Comment: CRITICAL RESULT CALLED TO, READ BACK BY AND VERIFIED WITH: CHARLES SHANLEVER @1636  06/06/20 MJU    Enterococcus Faecium NOT DETECTED NOT DETECTED Final   Listeria monocytogenes NOT DETECTED NOT DETECTED Final   Staphylococcus species NOT DETECTED NOT DETECTED Final   Staphylococcus aureus (BCID) NOT DETECTED NOT DETECTED Final   Staphylococcus epidermidis NOT DETECTED NOT DETECTED Final   Staphylococcus lugdunensis NOT DETECTED NOT DETECTED Final   Streptococcus species NOT DETECTED NOT DETECTED Final   Streptococcus agalactiae NOT DETECTED NOT DETECTED Final   Streptococcus pneumoniae NOT DETECTED NOT DETECTED Final   Streptococcus pyogenes NOT DETECTED NOT DETECTED Final   A.calcoaceticus-baumannii NOT DETECTED NOT DETECTED Final    Bacteroides fragilis NOT DETECTED NOT DETECTED Final   Enterobacterales NOT DETECTED NOT DETECTED Final   Enterobacter cloacae complex NOT DETECTED NOT DETECTED Final   Escherichia coli NOT DETECTED NOT DETECTED Final   Klebsiella aerogenes NOT DETECTED NOT DETECTED Final   Klebsiella oxytoca NOT DETECTED NOT DETECTED Final   Klebsiella pneumoniae NOT DETECTED NOT DETECTED Final   Proteus species NOT DETECTED NOT DETECTED Final   Salmonella species NOT DETECTED NOT DETECTED Final   Serratia marcescens NOT DETECTED NOT DETECTED Final   Haemophilus influenzae NOT DETECTED NOT DETECTED Final   Neisseria meningitidis NOT DETECTED NOT DETECTED Final   Pseudomonas aeruginosa NOT DETECTED NOT DETECTED Final   Stenotrophomonas maltophilia NOT DETECTED NOT DETECTED Final   Candida albicans NOT DETECTED NOT DETECTED Final   Candida auris NOT DETECTED NOT DETECTED Final   Candida glabrata NOT DETECTED NOT DETECTED Final   Candida krusei NOT DETECTED NOT DETECTED Final   Candida parapsilosis NOT DETECTED NOT DETECTED Final   Candida tropicalis NOT DETECTED NOT DETECTED Final   Cryptococcus neoformans/gattii NOT DETECTED NOT DETECTED Final   Vancomycin resistance NOT DETECTED NOT DETECTED Final    Comment: Performed at [facility], [address] [place], [place] [address]  Culture, blood (Routine X 2) w Reflex to ID Panel     Status: Abnormal   Collection Time: 06/05/20  1:25 PM   Specimen: BLOOD  Result Value Ref Range Status   Specimen Description   Final    BLOOD LAC Performed at [facility], [address], [place], [place] [address]    Special Requests   Final    BOTTLES DRAWN AEROBIC AND ANAEROBIC Blood Culture adequate volume Performed at [facility], [address] [place], [place] [address]    Culture  Setup Time   Final    Organism ID to follow ANAEROBIC BOTTLE ONLY CRITICAL RESULT CALLED TO, READ BACK BY AND VERIFIED WITH: JASON ROBBINS  06/07/20 AT 1506 BY ACR GRAM POSITIVE COCCI PREVIOUSLY REPORTED AS: GRAM NEGATIVE COCCOBACILLI CORRECTED RESULTS CALLED TO: D,Ziegler PHARMD, at [place] 06/07/20 BY E. BENTON    Culture (A)  Final    ENTEROCOCCUS FAECALIS SUSCEPTIBILITIES PERFORMED ON PREVIOUS CULTURE WITHIN THE LAST 5 DAYS. Performed at [facility], [place],  [place] [address]    Report Status 06/08/2020 FINAL  Final  Blood Culture ID Panel (Reflexed)     Status: Abnormal   Collection Time: 06/05/20  1:25 PM  Result Value Ref Range Status   Enterococcus faecalis DETECTED (A) NOT DETECTED Final    Comment: CRITICAL RESULT CALLED TO, READ BACK BY AND VERIFIED WITH: JASON ROBBINS 06/07/20 AT 1506 BY ACR    Enterococcus Faecium NOT DETECTED NOT DETECTED Final   Listeria monocytogenes NOT DETECTED NOT DETECTED Final   Staphylococcus species NOT DETECTED NOT DETECTED Final   Staphylococcus aureus (BCID) NOT DETECTED NOT DETECTED Final   Staphylococcus epidermidis NOT DETECTED NOT DETECTED Final   Staphylococcus lugdunensis NOT DETECTED NOT DETECTED Final   Streptococcus species NOT DETECTED NOT DETECTED Final   Streptococcus agalactiae NOT DETECTED NOT DETECTED Final   Streptococcus pneumoniae NOT DETECTED NOT DETECTED Final   Streptococcus pyogenes NOT DETECTED NOT DETECTED Final   A.calcoaceticus-baumannii NOT DETECTED NOT DETECTED Final   Bacteroides fragilis NOT DETECTED NOT DETECTED Final   Enterobacterales NOT DETECTED NOT DETECTED Final   Enterobacter cloacae complex NOT DETECTED NOT DETECTED Final   Escherichia coli NOT DETECTED NOT DETECTED Final   Klebsiella aerogenes NOT DETECTED NOT DETECTED Final   Klebsiella oxytoca NOT DETECTED NOT DETECTED Final   Klebsiella pneumoniae NOT DETECTED NOT DETECTED Final   Proteus species NOT DETECTED NOT DETECTED Final   Salmonella species NOT DETECTED NOT DETECTED Final   Serratia marcescens NOT DETECTED NOT DETECTED Final   Haemophilus influenzae NOT  DETECTED NOT DETECTED Final   Neisseria meningitidis NOT DETECTED NOT DETECTED Final   Pseudomonas aeruginosa NOT DETECTED NOT DETECTED Final   Stenotrophomonas maltophilia NOT DETECTED NOT DETECTED Final   Candida albicans NOT DETECTED NOT DETECTED Final   Candida auris NOT DETECTED NOT DETECTED Final   Candida glabrata NOT DETECTED NOT DETECTED Final   Candida krusei NOT DETECTED NOT DETECTED Final   Candida parapsilosis NOT DETECTED NOT DETECTED Final   Candida tropicalis NOT DETECTED NOT DETECTED Final   Cryptococcus neoformans/gattii NOT DETECTED NOT DETECTED Final   Vancomycin resistance NOT DETECTED NOT DETECTED Final    Comment: Performed at [facility], [address] [place], [place] [address]  CULTURE, BLOOD (ROUTINE X 2) w Reflex to ID Panel     Status: None (Preliminary result)   Collection Time: 06/07/20  7:02 PM   Specimen: BLOOD  Result Value Ref Range Status   Specimen Description BLOOD BLOOD LEFT FOREARM  Final   Special Requests   Final    BOTTLES DRAWN AEROBIC AND ANAEROBIC Blood Culture results may not be optimal due to an inadequate volume of blood received in culture bottles   Culture   Final    NO GROWTH < 12 HOURS Performed at [facility], [address] [place], [place] [address]    Report Status PENDING  Incomplete  CULTURE, BLOOD (ROUTINE X 2) w Reflex to ID Panel     Status: None (Preliminary result)   Collection Time: 06/07/20  8:46 PM   Specimen: BLOOD  Result Value Ref Range Status   Specimen Description BLOOD BLOOD LEFT HAND  Final   Special Requests   Final    BOTTLES DRAWN AEROBIC ONLY Blood Culture results may not be optimal due to an inadequate volume of blood received in culture bottles   Culture   Final    NO GROWTH < 12 HOURS Performed at [facility], [address] [place], [place]  [address]    Report Status PENDING  Incomplete    Coagulation Studies: No results for input(s): LABPROT, INR in the last  72 hours.  Urinalysis: No results for input(s): COLORURINE, LABSPEC, PHURINE, GLUCOSEU, HGBUR, BILIRUBINUR, KETONESUR, PROTEINUR, UROBILINOGEN, NITRITE, LEUKOCYTESUR in the last 72 hours.  Invalid input(s): APPERANCEUR    Imaging: DG Chest Port 1 View  Result Date: 06/07/2020 CLINICAL DATA:  RIGHT-sided chest pain and rib pain for 5 days EXAM: PORTABLE CHEST 1 VIEW COMPARISON:  Chest x-ray 06/05/2020 FINDINGS: RIGHT-sided dialysis catheter terminates in the upper RIGHT atrium, no change from the previous study. Stenting of LEFT axillary and subclavian venous system also unchanged. Heart size remains enlarged. Lung volumes mild bibasilar airspace disease. No acute skeletal process on limited assessment. IMPRESSION: 1. Low lung volumes with mild bibasilar airspace disease similar to prior. 2. Mild interstitial prominence may reflect mild pulmonary edema though is similar to the previous evaluation. Findings remain improved when compared to 06/02/2020. 3. No acute findings in the chest. 4. Stable cardiomegaly. Electronically Signed   By: Zetta Bills M.D.   On: 06/07/2020 11:17   ECHOCARDIOGRAM COMPLETE  Result Date: 06/07/2020    ECHOCARDIOGRAM REPORT   Patient Name:   [name] Date of Exam: 06/07/2020 Medical Rec #:  [id number]   Height:       65.0 in Accession #:    [id number]  Weight:       197.5 lb Date of Birth:  11/15/1960    BSA:          1.968 m Patient Age:    29 years    BP:           133/52 mmHg Patient Gender: F           HR:           63 bpm. Exam Location:  [facility] Procedure: 2D Echo, Cardiac Doppler and Color Doppler Indications:     Bacteremia 790.7  History:         Patient has prior history of Echocardiogram examinations, most                  recent 02/24/2020. Previous Myocardial Infarction, COPD; Risk                  Factors:Diabetes. Mitral Regurgitation.  Sonographer:     Sherrie Sport RDCS (AE) Referring Phys:  [id number] Tsosie Billing Diagnosing Phys: Bartholome Bill MD   Sonographer Comments: Suboptimal apical window. IMPRESSIONS  1. Left ventricular ejection fraction, by estimation, is 60 to 65%. The left ventricle has normal function. The left ventricle has no regional wall motion abnormalities. There is mild left ventricular hypertrophy. Left ventricular diastolic parameters are consistent with Grade I diastolic dysfunction (impaired relaxation).  2. Right ventricular systolic function was not well visualized. The right ventricular size is mildly enlarged.  3. Right atrial size was mildly dilated.  4. The mitral valve is grossly normal. Trivial mitral valve regurgitation.  5. Tricuspid valve regurgitation is moderate.  6. The aortic valve is tricuspid. Aortic valve regurgitation is not visualized. FINDINGS  Left Ventricle: Left ventricular ejection fraction, by estimation, is 60 to 65%. The left ventricle has normal function. The left ventricle has no regional wall motion abnormalities. The left ventricular internal cavity size was normal in size. There is  mild left ventricular hypertrophy. Left ventricular diastolic parameters are consistent with Grade I diastolic dysfunction (impaired relaxation). Right Ventricle: The right ventricular size is mildly enlarged. Right vetricular  wall thickness was not well visualized. Right ventricular systolic function was not well visualized. Left Atrium: Left atrial size was normal in size. Right Atrium: Right atrial size was mildly dilated. Pericardium: There is no evidence of pericardial effusion. Mitral Valve: The mitral valve is grossly normal. Trivial mitral valve regurgitation. Tricuspid Valve: The tricuspid valve is grossly normal. Tricuspid valve regurgitation is moderate. Aortic Valve: The aortic valve is tricuspid. Aortic valve regurgitation is not visualized. Aortic valve mean gradient measures 5.0 mmHg. Aortic valve peak gradient measures 8.0 mmHg. Aortic valve area, by VTI measures 1.68 cm. Pulmonic Valve: The pulmonic valve was  not well visualized. Pulmonic valve regurgitation is trivial. Aorta: The aortic root was not well visualized. IAS/Shunts: The interatrial septum was not assessed.  LEFT VENTRICLE PLAX 2D LVIDd:         4.60 cm  Diastology LVIDs:         2.89 cm  LV e' medial:    6.20 cm/s LV PW:         0.95 cm  LV E/e' medial:  17.3 LV IVS:        0.98 cm  LV e' lateral:   9.03 cm/s LVOT diam:     2.00 cm  LV E/e' lateral: 11.8 LV SV:         52 LV SV Index:   27 LVOT Area:     3.14 cm  RIGHT VENTRICLE RV Basal diam:  5.30 cm RV S prime:     12.00 cm/s TAPSE (M-mode): 3.5 cm LEFT ATRIUM             Index       RIGHT ATRIUM           Index LA diam:        3.70 cm 1.88 cm/m  RA Area:     29.00 cm LA Vol (A2C):   75.4 ml 38.32 ml/m RA Volume:   120.00 ml 60.98 ml/m LA Vol (A4C):   34.6 ml 17.58 ml/m LA Biplane Vol: 53.2 ml 27.04 ml/m  AORTIC VALVE                    PULMONIC VALVE AV Area (Vmax):    1.86 cm     PV Vmax:        0.59 m/s AV Area (Vmean):   1.65 cm     PV Peak grad:   1.4 mmHg AV Area (VTI):     1.68 cm     RVOT Peak grad: 2 mmHg AV Vmax:           141.00 cm/s AV Vmean:          103.350 cm/s AV VTI:            0.310 m AV Peak Grad:      8.0 mmHg AV Mean Grad:      5.0 mmHg LVOT Vmax:         83.60 cm/s LVOT Vmean:        54.200 cm/s LVOT VTI:          0.166 m LVOT/AV VTI ratio: 0.54  AORTA Ao Root diam: 2.40 cm MITRAL VALVE                TRICUSPID VALVE MV Area (PHT): 3.60 cm     TR Peak grad:   60.2 mmHg MV Decel Time: 211 msec     TR Vmax:        388.00 cm/s  MV E velocity: 107.00 cm/s MV A velocity: 104.00 cm/s  SHUNTS MV E/A ratio:  1.03         Systemic VTI:  0.17 m                             Systemic Diam: 2.00 cm Bartholome Bill MD Electronically signed by Bartholome Bill MD Signature Date/Time: 06/07/2020/10:42:33 AM    Final      Medications:   . ampicillin (OMNIPEN) IV    . gentamicin    . [START ON 06/11/2020] gentamicin     . aspirin EC  81 mg Oral Daily  . atorvastatin  20 mg Oral Daily  .  carvedilol  6.25 mg Oral BID WC  . Chlorhexidine Gluconate Cloth  6 each Topical Daily  . cholestyramine light  4 g Oral BID  . epoetin (EPOGEN/PROCRIT) injection  10,000 Units Intravenous Q M,W,F-HD  . ferrous sulfate  325 mg Oral Daily  . furosemide  80 mg Oral Daily  . gabapentin  100 mg Oral QPM  . heparin  5,000 Units Subcutaneous Q8H  . hydrOXYzine  25 mg Oral Daily  . lipase/protease/amylase  12,000 Units Oral TID WC  .  morphine injection  4 mg Intravenous Once  . multivitamin  1 tablet Oral Daily  . sevelamer carbonate  800 mg Oral TID WC  . cyanocobalamin  1,000 mcg Oral Daily   acetaminophen **OR** acetaminophen, calcium carbonate, camphor-menthol **AND** hydrOXYzine, feeding supplement (NEPRO CARB STEADY), hydrALAZINE, ipratropium-albuterol, loperamide, morphine injection, naLOXone (NARCAN)  injection, ondansetron **OR** ondansetron (ZOFRAN) IV, oxyCODONE-acetaminophen, promethazine, sorbitol, zolpidem  Assessment/ Plan:  59 y.o. female with past medical history of ESRD on HD MWF, hypertension, COPD, diastolic heart failure, depression, diabetes mellitus type 2, hyperlipidemia, anemia of chronic kidney disease, secondary hyperparathyroidism, new dialysis access placement 04/12/2020 who presented with missed dialysis treatment and volume overload.  [facility]/MWF3/[place]/81kg.  1.  ESRD on HD MWF/volume overload/pulmonary edema.   -Pt due for HD today.  Had small raised area at inferior aspect of access, appears as a small localized infiltration but could represent infection.  Discuss with vascular surgery.   2.  Anemia of chronic kidney disease.   Continue epogen 10000 units IV with HD.   3.  Secondary hyperparathyroidism.  Continue renvela 1 tab PO TID/WM.   4.  Enterococcal bacetermia:  On ampicillin and gentamicin.  Will need TEE For additional evaluation.   LOS: 5 Zayed Griffie 9/17/20212:40 PM

## 2020-06-08 NOTE — Progress Notes (Addendum)
   06/08/20 1350  Clinical Encounter Type  Visited With Patient not available  Visit Type Follow-up  Referral From Chaplain  Consult/Referral To Chaplain  While rounding 2C, chaplain stopped by to see pt, but she was not in the room. Chaplain will follow up later.

## 2020-06-08 NOTE — Progress Notes (Signed)
ID Pt with numerous UE AVF.GRAFT complications 5/80/99 creation of rt arm prosthetic graft AV graft 7/23 removal of rt IJ permacath and placement of RT common femoral dialysis cath  9/3 femoral dialysis catheter removed 9/9 seen Dr.Schneir and his note says still having problem with cannulation of the hero graft 9/11 admitted with SOB Pt has no SOB now Pt says the rt arm swelling getting bigger No fever Patient Vitals for the past 24 hrs:  BP Temp Temp src Pulse Resp SpO2  06/08/20 1555 (!) 174/58 98.6 F (37 C) Oral (!) 59 18 97 %  06/08/20 0507 (!) 145/55 97.7 F (36.5 C) Oral (!) 55 16 97 %  06/07/20 2200 (!) 139/43 98.3 F (36.8 C) Oral (!) 58 -- 97 %    O/e awake and alert Chest b/l air entry [id number] abd soft Rt arm- graft- Erythematous Swelling at the distal end of graft site     Labs CBC Latest Ref Rng & Units 06/06/2020 06/05/2020 06/04/2020  WBC 4.0 - 10.5 K/uL 5.7 8.3 3.3(L)  Hemoglobin 12.0 - 15.0 g/dL 8.2(L) 8.2(L) 7.7(L)  Hematocrit 36 - 46 % 25.2(L) 27.0(L) 23.8(L)  Platelets 150 - 400 K/uL 78(L) 77(L) 71(L)    CMP Latest Ref Rng & Units 06/06/2020 06/05/2020 06/04/2020  Glucose 70 - 99 mg/dL 239(H) 268(H) 98  BUN 8 - 23 mg/dL 18 15 17   Creatinine 0.44 - 1.00 mg/dL 5.03(H) 4.30(H) 4.80(H)  Sodium 135 - 145 mmol/L 131(L) 134(L) 136  Potassium 3.5 - 5.1 mmol/L 3.7 3.8 3.7  Chloride 98 - 111 mmol/L 93(L) 93(L) 97(L)  CO2 22 - 32 mmol/L 27 27 30   Calcium 8.9 - 10.3 mg/dL 7.8(L) 7.8(L) 7.8(L)  Total Protein 6.5 - 8.1 g/dL - - -  Total Bilirubin 0.3 - 1.2 mg/dL - - -  Alkaline Phos 38 - 126 U/L - - -  AST 15 - 41 U/L - - -  ALT 0 - 44 U/L - - -    BC- 9/13 and 9/14 enterococcus   Impression/recommendation Enterococcus bacteremia source could be the graft site as there is a firm erythematous swelling rt arm. Discussed with vascular team- they are ordering arterial duplex to r/o hematoma vs pseudoaneurysm vs abscess Concern for endocarditisendovascular  infection- will do amp+ gent combo Needs TEE Follow repeat blood cultures  congestive heart failure and pulmonary edema need to rule out endocarditis. 2 d echo shows moderate tricuspid regurg Will need TEE   Pulmonary edema/CHF had to undergo urgent dialysis  Accelerated hypertension- management as per primary team  DM gastroparesis with nausea/vomiting   ESRD on dialysis Anemia due to the above  thrombocytopenia  Discussed the management with patient and the care team ID will follow her peripherally this weekend- call if needed

## 2020-06-08 NOTE — Progress Notes (Signed)
[name] MRN: [id number] DOB: [birth year]   Cancelled Treatment:     PT attempt. Pt refused. " I'm not doing anything until I find out what they are going do with my arm." RN was in room giving meds. Will continue efforts to progress and follow pt current POC.    Willette Pa 06/08/2020, 11:52 AM

## 2020-06-08 NOTE — Progress Notes (Signed)
[facility] Daily Progress Note   Subjective: Patient with swelling to distal aspect of dialysis access. Denies pain. States possibly formed after last dialysis session.   Objective: Vitals:   06/07/20 0428 06/07/20 1205 06/07/20 2200 06/08/20 0507  BP: (!) 133/52 (!) 160/50 (!) 139/43 (!) 145/55  Pulse: 63 61 (!) 58 (!) 55  Resp: 16 16  16   Temp: 98 F (36.7 C) 98.4 F (36.9 C) 98.3 F (36.8 C) 97.7 F (36.5 C)  TempSrc: Oral Oral Oral Oral  SpO2: 95% 93% 97% 97%  Weight:      Height:        Intake/Output Summary (Last 24 hours) at 06/08/2020 1210 Last data filed at 06/07/2020 1700 Gross per 24 hour  Intake 100 ml  Output -  Net 100 ml   Physical Exam: A&Ox3, NAD CV: RRR Pulmonary: CTA Bilaterally Abdomen: Soft, Nontender, Nondistended Vascular:  Right Upper Extremity: soft, non-tender, non-erythematous, non-pulisitile.    Laboratory: CBC    Component Value Date/Time   WBC 5.7 06/06/2020 0612   HGB 8.2 (L) 06/06/2020 0612   HGB 10.5 (L) 09/15/2014 0948   HCT 25.2 (L) 06/06/2020 0612   HCT 34.2 (L) 09/15/2014 0948   PLT 78 (L) 06/06/2020 0612   PLT 203 09/15/2014 0948   BMET    Component Value Date/Time   NA 131 (L) 06/06/2020 0612   NA 135 (L) 09/15/2014 0948   K 3.7 06/06/2020 0612   K 4.8 09/15/2014 0948   CL 93 (L) 06/06/2020 0612   CL 99 09/15/2014 0948   CO2 27 06/06/2020 0612   CO2 26 09/15/2014 0948   GLUCOSE 239 (H) 06/06/2020 0612   GLUCOSE 118 (H) 09/15/2014 0948   BUN 18 06/06/2020 0612   BUN 19 (H) 09/15/2014 0948   CREATININE 5.03 (H) 06/06/2020 0612   CREATININE 6.79 (H) 09/15/2014 0948   CALCIUM 7.8 (L) 06/06/2020 0612   CALCIUM 8.3 (L) 09/15/2014 0948   GFRNONAA 9 (L) 06/06/2020 0612   GFRNONAA 7 (L) 09/15/2014 0948   GFRNONAA 6 (L) 05/31/2014 0432   GFRAA 10 (L) 06/06/2020 0612   GFRAA 8 (L) 09/15/2014 0948   GFRAA 7 (L) 05/31/2014 0432   Assessment/Planning: The patient is a 59 year old female ESRD now  with swelling at distal aspect of dialysis access / positive blood cultures.   1) Arterial duplex - r/o hematoma, pseudo, abscess Ordered   Seen and examined with Tamera Stands PA-C 06/08/2020 12:10 PM

## 2020-06-08 NOTE — Consult Note (Signed)
Pharmacy Antibiotic Note  [name] is a 59 y.o. female admitted on 06/02/2020 with respiratory failure secondary to acute diastolic CHF and COPD exacerbation. PMH includes ESRD on HD, HTN, DM, CAD, PCD. Pharmacy has been consulted for amplicillin and gentamicin dosing for possible infective endocarditis.   Patient with ESRD on HD with numerous graft complications. Erythemous firm swelling in right cubital area of graft arm. Major criteria include + blood cx collected >12hrs apart. Minor [facility] IE criteria includes fever >38C, predisposing heart disease (CHF), microbiological evidence (enterococcus facaelis detected on BCID). Graft site noted as possible source of bloodstream infection.    Cultures this admission:  BCx 9/13: Enterococcus faecalis, amp sensitive, gent synergy sensitive BCx 9/14: Enterococcus faecalis  BCID 9/14: Enterococcus faecalis detected  BCx 9/16: NGTD in 4/4 bottles  Plan:  Initiate ampicillin 2g every 12 hours today.    Initiate gentamicin loading dose of 100mg  today, followed by a maintenance dose of 80mg  on days of dialysis following HD sessions.   Monitor gentamicin peak 30 minutes after the second dose (goal peak: 3-4 mg/L) and a gentamicin trough prior to HD on day of the third dose (goal trough: <1 mg/L).   Monitor drug levels weekly thereafter to assess for accumulation as patient is tolerating full dialysis sessions.   Monitor signs of ototoxicity (I.e. ringing in ears) daily.    Height: 5\' 5"  (165.1 cm) Weight: 89.6 kg (197 lb 8.5 oz) IBW/kg (Calculated) : 57  Temp (24hrs), Avg:98.1 F (36.7 C), Min:97.7 F (36.5 C), Max:98.4 F (36.9 C)  Recent Labs  Lab 06/02/20 1907 06/03/20 0744 06/04/20 0658 06/05/20 1325 06/06/20 0612  WBC 5.4 8.1 3.3* 8.3 5.7  CREATININE 6.68* 4.56* 4.80* 4.30* 5.03*    Estimated Creatinine Clearance: 13 mL/min (A) (by C-G formula based on SCr of 5.03 mg/dL (H)).    Allergies  Allergen Reactions  . Ace Inhibitors  Swelling and Anaphylaxis  . Ativan [Lorazepam] Other (See Comments)    Reaction:Hallucinations and headaches  . Compazine [Prochlorperazine Edisylate] Anaphylaxis, Nausea And Vomiting and Other (See Comments)    Other reaction(s): dystonia from this vs. Reglan, 23 Jul - patient relates that she takes promethazine frequently with no problems  . Sumatriptan Succinate Other (See Comments)    Other reaction(s): delirium and hallucinations per [facility] records  . Zofran [Ondansetron] Nausea And Vomiting    Per pt. she is allergic to zofran or will experience adverse reaction like hallucination   . Lac Bovis Nausea And Vomiting  . Losartan Nausea Only  . Prochlorperazine Other (See Comments)    Reaction:  Unknown . Patient does not remember reaction but she does have vertigo and anxiety along with n and v at times. Could be used to treat any of these   . Reglan [Metoclopramide] Other (See Comments)    Per patient her Dr. Evelina Bucy her off it   . Scopolamine Other (See Comments)    Dizziness, also has vertigo already  . Tape Rash    Plastic tape causes rash  . Tapentadol Rash    Antimicrobials this admission: Azithromycin 9/12 >> 9/15 Ceftriaxone 9/12 >> 9/15  Zosyn 9/15 >> 9/17  Ampicillin 9/17 >>  Gentamicin 9/17 >>    Thank you for allowing pharmacy to be a part of this patient's care.  [name] 06/08/2020 10:27 AM

## 2020-06-08 NOTE — Care Management Important Message (Signed)
Important Message  Patient Details  Name: CIPRIANA BILLER MRN: [id number] Date of Birth: Apr 11, 1961   Medicare Important Message Given:  Yes  Reviewed with patient via room phone due to isolation status.  Declined copy of Medicare IM as one being mailed to patient's home address.    Dannette Barbara 06/08/2020, 4:20 PM

## 2020-06-09 ENCOUNTER — Inpatient Hospital Stay: Payer: Medicare HMO

## 2020-06-09 LAB — GLUCOSE, CAPILLARY
Glucose-Capillary: 100 mg/dL — ABNORMAL HIGH (ref 70–99)
Glucose-Capillary: 114 mg/dL — ABNORMAL HIGH (ref 70–99)
Glucose-Capillary: 147 mg/dL — ABNORMAL HIGH (ref 70–99)
Glucose-Capillary: 96 mg/dL (ref 70–99)
Glucose-Capillary: 97 mg/dL (ref 70–99)

## 2020-06-09 NOTE — Progress Notes (Signed)
Subjective/Chief Complaint: Ultrasound reviewed. Abscess/over graft. No evidence of pseudoaneurysm   Objective: Vital signs in last 24 hours: Temp:  [97.7 F (36.5 C)-98.7 F (37.1 C)] 98.7 F (37.1 C) (09/18 1045) Pulse Rate:  [56-59] 56 (09/18 0503) Resp:  [18] 18 (09/18 0503) BP: (149-174)/(50-58) 149/50 (09/18 0503) SpO2:  [97 %] 97 % (09/18 0503) Last BM Date: 06/05/20  Intake/Output from previous day: 09/17 0701 - 09/18 0700 In: 240 [P.O.:240] Out: -  Intake/Output this shift: No intake/output data recorded.  General appearance: alert and no distress Resp: clear to auscultation bilaterally Extremities: RIGHT arm erythema, abscess over inferior potion of graft, skin intact, nonpulsatile  Lab Results:  No results for input(s): WBC, HGB, HCT, PLT in the last 72 hours. BMET No results for input(s): NA, K, CL, CO2, GLUCOSE, BUN, CREATININE, CALCIUM in the last 72 hours. PT/INR No results for input(s): LABPROT, INR in the last 72 hours. ABG No results for input(s): PHART, HCO3 in the last 72 hours.  Invalid input(s): PCO2, PO2  Studies/Results: [place] UPPER EXTREMITY ARTERIAL RIGHT LIMITED (GRAFT, SINGLE VESSEL)  Result Date: 06/09/2020 CLINICAL DATA:  59 year old female with new swelling at the dialysis access. EXAM: RIGHT UPPER EXTREMITY ARTERIAL DUPLEX SCAN TECHNIQUE: Gray-scale sonography as well as color Doppler and duplex ultrasound was performed to evaluate a limited evaluation of the upper extremity. COMPARISON:  None. FINDINGS: Duplex performed in the region clinical concern. Flow confirmed within the dialysis circuit. No volume measurement or velocities were measured. Heterogeneous fluid present adjacent to the graft within the soft tissues. No internal color flow to suggest a patent pseudoaneurysm. Estimated dimension of the fluid 3.3 cm x 2.5 cm x 3.5 cm. IMPRESSION: Heterogeneous fluid collection adjacent to patent right upper extremity dialysis graft, with the  differential including blood products/hematoma versus abscess given the history. Electronically Signed   By: Corrie Mckusick D.O.   On: 06/09/2020 10:23    Anti-infectives: Anti-infectives (From admission, onward)   Start     Dose/Rate Route Frequency Ordered Stop   06/11/20 1200  gentamicin (GARAMYCIN) IVPB 80 mg        80 mg 100 mL/hr over 30 Minutes Intravenous Every M-W-F (Hemodialysis) 06/08/20 1356     06/08/20 1800  gentamicin (GARAMYCIN) IVPB 100 mg  Status:  Discontinued        100 mg 200 mL/hr over 30 Minutes Intravenous  Once 06/08/20 1356 06/08/20 1425   06/08/20 1800  gentamicin (GARAMYCIN) IVPB 100 mg  Status:  Discontinued        100 mg 200 mL/hr over 30 Minutes Intravenous  Once 06/08/20 1425 06/08/20 1430   06/08/20 1800  gentamicin (GARAMYCIN) IVPB 100 mg  Status:  Discontinued        100 mg 200 mL/hr over 30 Minutes Intravenous  Once 06/08/20 1430 06/08/20 1441   06/08/20 1800  gentamicin (GARAMYCIN) 100 mg in dextrose 5 % 50 mL IVPB        100 mg 105 mL/hr over 30 Minutes Intravenous  Once 06/08/20 1441 06/08/20 1927   06/08/20 1500  ampicillin (OMNIPEN) 2 g in sodium chloride 0.9 % 100 mL IVPB        2 g 300 mL/hr over 20 Minutes Intravenous Every 12 hours 06/08/20 1356     06/07/20 2200  piperacillin-tazobactam (ZOSYN) IVPB 2.25 g  Status:  Discontinued        2.25 g 100 mL/hr over 30 Minutes Intravenous Every 8 hours 06/07/20 1657 06/08/20 1010   06/07/20  1400  ampicillin (OMNIPEN) 2 g in sodium chloride 0.9 % 100 mL IVPB  Status:  Discontinued        2 g 300 mL/hr over 20 Minutes Intravenous Every 8 hours 06/07/20 1025 06/07/20 1637   06/06/20 1600  piperacillin-tazobactam (ZOSYN) IVPB 2.25 g  Status:  Discontinued        2.25 g 100 mL/hr over 30 Minutes Intravenous Every 8 hours 06/06/20 1403 06/07/20 1025   06/03/20 1400  azithromycin (ZITHROMAX) 500 mg in sodium chloride 0.9 % 250 mL IVPB  Status:  Discontinued        500 mg 250 mL/hr over 60 Minutes  Intravenous Every 24 hours 06/03/20 1322 06/06/20 1319   06/03/20 1400  cefTRIAXone (ROCEPHIN) 1 g in sodium chloride 0.9 % 100 mL IVPB  Status:  Discontinued        1 g 200 mL/hr over 30 Minutes Intravenous Every 24 hours 06/03/20 1322 06/06/20 1319      Assessment/Plan: Enterococcus Bacteremia, infected hERO graft  Requires HD today. Will place temporary HD catheter today. Will require abscess drainage and graft explant early next week per Dr. Delana Meyer. Continue ABX and HD  LOS: 6 days    [name] A 06/09/2020

## 2020-06-09 NOTE — Progress Notes (Addendum)
[facility]  ROUNDING NOTE   Subjective:   Patient unable to get HD yesterday due to clots from access.  Patient has a tender raised area at inferior aspect of access.  Denies any edema, shortness of breath.   Had an US done.   Objective:  Vital signs in last 24 hours:  Temp:  [97.7 F (36.5 C)-98.7 F (37.1 C)] 98.7 F (37.1 C) (09/18 1045) Pulse Rate:  [56-59] 56 (09/18 0503) Resp:  [18] 18 (09/18 0503) BP: (149-174)/(50-58) 149/50 (09/18 0503) SpO2:  [97 %] 97 % (09/18 0503)  Weight change:  Filed Weights   06/02/20 1056 06/05/20 0500 06/06/20 0800  Weight: 80.7 kg 82 kg 89.6 kg    Intake/Output: I/O last 3 completed shifts: In: 240 [P.O.:240] Out: -    Intake/Output this shift:  No intake/output data recorded.  Physical Exam: General: NAD,   Head: Normocephalic, atraumatic. Moist oral mucosal membranes  Eyes: Anicteric, PERRL  Neck: Supple, trachea midline  Lungs:  Clear to auscultation  Heart: Regular rate and rhythm  Abdomen:  Soft, nontender,   Extremities:  No peripheral edema.  Neurologic: Nonfocal, moving all four extremities  Skin: No lesions  Access: Right Hero graft, tender ,erythematous raised area inferior aspect of access.     Basic Metabolic Panel: Recent Labs  Lab 06/02/20 1907 06/02/20 1907 06/03/20 0744 06/04/20 0658 06/05/20 1325 06/06/20 0612 06/08/20 1722  NA 137  --   --  136 134* 131*  --   K 4.2  --   --  3.7 3.8 3.7  --   CL 98  --   --  97* 93* 93*  --   CO2 28  --   --  30 27 27   --   GLUCOSE 132*  --   --  98 268* 239*  --   BUN 25*  --   --  17 15 18   --   CREATININE 6.68*  --  4.56* 4.80* 4.30* 5.03*  --   CALCIUM 8.7*   < >  --  7.8* 7.8* 7.8*  --   PHOS  --   --   --   --   --   --  1.9*   < > = values in this interval not displayed.    Liver Function Tests: No results for input(s): AST, ALT, ALKPHOS, BILITOT, PROT, ALBUMIN in the last 168 hours. No results for input(s): LIPASE, AMYLASE in the  last 168 hours. No results for input(s): AMMONIA in the last 168 hours.  CBC: Recent Labs  Lab 06/02/20 1907 06/03/20 0744 06/04/20 0658 06/05/20 1325 06/06/20 0612  WBC 5.4 8.1 3.3* 8.3 5.7  NEUTROABS 2.0  --   --   --   --   HGB 8.9* 8.0* 7.7* 8.2* 8.2*  HCT 26.5* 24.9* 23.8* 27.0* 25.2*  MCV 88.6 90.2 89.1 93.8 89.7  PLT 130* 118* 71* 77* 78*    Cardiac Enzymes: No results for input(s): CKTOTAL, CKMB, CKMBINDEX, TROPONINI in the last 168 hours.  BNP: Invalid input(s): POCBNP  CBG: Recent Labs  Lab 06/08/20 1552 06/08/20 1949 06/09/20 0000 06/09/20 0501 06/09/20 0759  GLUCAP 127* 127* 147* 96 74    Microbiology: Results for orders placed or performed during the hospital encounter of 06/02/20  SARS Coronavirus 2 by RT PCR (hospital order, performed in [facility] hospital lab) Nasopharyngeal Nasopharyngeal Swab     Status: None   Collection Time: 06/03/20  6:49 AM   Specimen: Nasopharyngeal  Swab  Result Value Ref Range Status   SARS Coronavirus 2 NEGATIVE NEGATIVE Final    Comment: (NOTE) SARS-CoV-2 target nucleic acids are NOT DETECTED.  The SARS-CoV-2 RNA is generally detectable in upper and lower respiratory specimens during the acute phase of infection. The lowest concentration of SARS-CoV-2 viral copies this assay can detect is 250 copies / mL. A negative result does not preclude SARS-CoV-2 infection and should not be used as the sole basis for treatment or other patient management decisions.  A negative result may occur with improper specimen collection / handling, submission of specimen other than nasopharyngeal swab, presence of viral mutation(s) within the areas targeted by this assay, and inadequate number of viral copies (<250 copies / mL). A negative result must be combined with clinical observations, patient history, and epidemiological information.  Fact Sheet for Patients:   StrictlyIdeas.no  Fact Sheet for  Healthcare Providers: BankingDealers.co.za  This test is not yet approved or  cleared by the [place] FDA and has been authorized for detection and/or diagnosis of SARS-CoV-2 by FDA under an Emergency Use Authorization (EUA).  This EUA will remain in effect (meaning this test can be used) for the duration of the COVID-19 declaration under Section 564(b)(1) of the Act, 21 U.S.C. section 360bbb-3(b)(1), unless the authorization is terminated or revoked sooner.  Performed at [facility], [address] [place], [place] [address]   Gastrointestinal Panel by PCR , Stool     Status: None   Collection Time: 06/04/20  7:39 PM   Specimen: Stool  Result Value Ref Range Status   Campylobacter species NOT DETECTED NOT DETECTED Final   Plesimonas shigelloides NOT DETECTED NOT DETECTED Final   Salmonella species NOT DETECTED NOT DETECTED Final   Yersinia enterocolitica NOT DETECTED NOT DETECTED Final   Vibrio species NOT DETECTED NOT DETECTED Final   Vibrio cholerae NOT DETECTED NOT DETECTED Final   Enteroaggregative E coli (EAEC) NOT DETECTED NOT DETECTED Final   Enteropathogenic E coli (EPEC) NOT DETECTED NOT DETECTED Final   Enterotoxigenic E coli (ETEC) NOT DETECTED NOT DETECTED Final   Shiga like toxin producing E coli (STEC) NOT DETECTED NOT DETECTED Final   Shigella/Enteroinvasive E coli (EIEC) NOT DETECTED NOT DETECTED Final   Cryptosporidium NOT DETECTED NOT DETECTED Final   Cyclospora cayetanensis NOT DETECTED NOT DETECTED Final   Entamoeba histolytica NOT DETECTED NOT DETECTED Final   Giardia lamblia NOT DETECTED NOT DETECTED Final   Adenovirus F40/41 NOT DETECTED NOT DETECTED Final   Astrovirus NOT DETECTED NOT DETECTED Final   Norovirus GI/GII NOT DETECTED NOT DETECTED Final   Rotavirus A NOT DETECTED NOT DETECTED Final   Sapovirus (I, II, IV, and V) NOT DETECTED NOT DETECTED Final    Comment: Performed at [facility], [address] [place], [place] [address]  Culture, blood (Routine X 2) w Reflex to ID Panel     Status: Abnormal   Collection Time: 06/04/20  8:49 PM   Specimen: BLOOD  Result Value Ref Range Status   Specimen Description   Final    BLOOD BLOOD LEFT FOREARM Performed at [facility], [address] [place], [place] [address]    Special Requests   Final    BOTTLES DRAWN AEROBIC AND ANAEROBIC Blood Culture adequate volume Performed at [facility], [address] [place],  [address]    Culture  Setup Time   Final    IN BOTH AEROBIC AND ANAEROBIC BOTTLES GRAM POSITIVE COCCI Organism  ID to follow CRITICAL RESULT CALLED TO, READ BACK BY AND VERIFIED WITH: CHARLES SHANLEVER @1636  06/06/20 MJU LISA KLUTTZ AT 2505 ON 06/07/2020 [place]. Performed at [facility], [place] [address] [place], [place] [address]    Culture ENTEROCOCCUS FAECALIS (A)  Final   Report Status 06/08/2020 FINAL  Final   Organism ID, Bacteria ENTEROCOCCUS FAECALIS  Final      Susceptibility   Enterococcus faecalis - MIC*    AMPICILLIN <=2 SENSITIVE Sensitive     VANCOMYCIN 1 SENSITIVE Sensitive     GENTAMICIN SYNERGY SENSITIVE Sensitive     * ENTEROCOCCUS FAECALIS  Blood Culture ID Panel (Reflexed)     Status: Abnormal   Collection Time: 06/04/20  8:49 PM  Result Value Ref Range Status   Enterococcus faecalis DETECTED (A) NOT DETECTED Final    Comment: CRITICAL RESULT CALLED TO, READ BACK BY AND VERIFIED WITH: CHARLES SHANLEVER @1636  06/06/20 MJU    Enterococcus Faecium NOT DETECTED NOT DETECTED Final   Listeria monocytogenes NOT DETECTED NOT DETECTED Final   Staphylococcus species NOT DETECTED NOT DETECTED Final   Staphylococcus aureus (BCID) NOT DETECTED NOT DETECTED Final   Staphylococcus epidermidis NOT DETECTED NOT DETECTED Final   Staphylococcus lugdunensis NOT DETECTED NOT DETECTED Final   Streptococcus species NOT DETECTED NOT DETECTED Final   Streptococcus agalactiae NOT DETECTED NOT  DETECTED Final   Streptococcus pneumoniae NOT DETECTED NOT DETECTED Final   Streptococcus pyogenes NOT DETECTED NOT DETECTED Final   A.calcoaceticus-baumannii NOT DETECTED NOT DETECTED Final   Bacteroides fragilis NOT DETECTED NOT DETECTED Final   Enterobacterales NOT DETECTED NOT DETECTED Final   Enterobacter cloacae complex NOT DETECTED NOT DETECTED Final   Escherichia coli NOT DETECTED NOT DETECTED Final   Klebsiella aerogenes NOT DETECTED NOT DETECTED Final   Klebsiella oxytoca NOT DETECTED NOT DETECTED Final   Klebsiella pneumoniae NOT DETECTED NOT DETECTED Final   Proteus species NOT DETECTED NOT DETECTED Final   Salmonella species NOT DETECTED NOT DETECTED Final   Serratia marcescens NOT DETECTED NOT DETECTED Final   Haemophilus influenzae NOT DETECTED NOT DETECTED Final   Neisseria meningitidis NOT DETECTED NOT DETECTED Final   Pseudomonas aeruginosa NOT DETECTED NOT DETECTED Final   Stenotrophomonas maltophilia NOT DETECTED NOT DETECTED Final   Candida albicans NOT DETECTED NOT DETECTED Final   Candida auris NOT DETECTED NOT DETECTED Final   Candida glabrata NOT DETECTED NOT DETECTED Final   Candida krusei NOT DETECTED NOT DETECTED Final   Candida parapsilosis NOT DETECTED NOT DETECTED Final   Candida tropicalis NOT DETECTED NOT DETECTED Final   Cryptococcus neoformans/gattii NOT DETECTED NOT DETECTED Final   Vancomycin resistance NOT DETECTED NOT DETECTED Final    Comment: Performed at [facility], [address] [place], [place] [address]  Culture, blood (Routine X 2) w Reflex to ID Panel     Status: Abnormal   Collection Time: 06/05/20  1:25 PM   Specimen: BLOOD  Result Value Ref Range Status   Specimen Description   Final    BLOOD LAC Performed at [facility], [address], [place], [place] [address]    Special Requests   Final    BOTTLES DRAWN AEROBIC AND ANAEROBIC Blood Culture adequate volume Performed at [name], [address] [place], [place] [address]    Culture  Setup Time   Final    Organism ID to follow ANAEROBIC BOTTLE ONLY CRITICAL RESULT CALLED TO, READ BACK BY AND VERIFIED WITH: JASON ROBBINS 06/07/20  AT 1506 BY ACR GRAM POSITIVE COCCI PREVIOUSLY REPORTED AS: GRAM NEGATIVE COCCOBACILLI CORRECTED RESULTS CALLED TO: D,Ziegler PHARMD, at [place] 06/07/20 BY E. BENTON    Culture (A)  Final    ENTEROCOCCUS FAECALIS SUSCEPTIBILITIES PERFORMED ON PREVIOUS CULTURE WITHIN THE LAST 5 DAYS. Performed at [facility], [place] [address] [place], [place] [address]    Report Status 06/08/2020 FINAL  Final  Blood Culture ID Panel (Reflexed)     Status: Abnormal   Collection Time: 06/05/20  1:25 PM  Result Value Ref Range Status   Enterococcus faecalis DETECTED (A) NOT DETECTED Final    Comment: CRITICAL RESULT CALLED TO, READ BACK BY AND VERIFIED WITH: JASON ROBBINS 06/07/20 AT 1506 BY ACR    Enterococcus Faecium NOT DETECTED NOT DETECTED Final   Listeria monocytogenes NOT DETECTED NOT DETECTED Final   Staphylococcus species NOT DETECTED NOT DETECTED Final   Staphylococcus aureus (BCID) NOT DETECTED NOT DETECTED Final   Staphylococcus epidermidis NOT DETECTED NOT DETECTED Final   Staphylococcus lugdunensis NOT DETECTED NOT DETECTED Final   Streptococcus species NOT DETECTED NOT DETECTED Final   Streptococcus agalactiae NOT DETECTED NOT DETECTED Final   Streptococcus pneumoniae NOT DETECTED NOT DETECTED Final   Streptococcus pyogenes NOT DETECTED NOT DETECTED Final   A.calcoaceticus-baumannii NOT DETECTED NOT DETECTED Final   Bacteroides fragilis NOT DETECTED NOT DETECTED Final   Enterobacterales NOT DETECTED NOT DETECTED Final   Enterobacter cloacae complex NOT DETECTED NOT DETECTED Final   Escherichia coli NOT DETECTED NOT DETECTED Final   Klebsiella aerogenes NOT DETECTED NOT DETECTED Final   Klebsiella oxytoca NOT DETECTED NOT DETECTED Final   Klebsiella pneumoniae NOT DETECTED NOT DETECTED  Final   Proteus species NOT DETECTED NOT DETECTED Final   Salmonella species NOT DETECTED NOT DETECTED Final   Serratia marcescens NOT DETECTED NOT DETECTED Final   Haemophilus influenzae NOT DETECTED NOT DETECTED Final   Neisseria meningitidis NOT DETECTED NOT DETECTED Final   Pseudomonas aeruginosa NOT DETECTED NOT DETECTED Final   Stenotrophomonas maltophilia NOT DETECTED NOT DETECTED Final   Candida albicans NOT DETECTED NOT DETECTED Final   Candida auris NOT DETECTED NOT DETECTED Final   Candida glabrata NOT DETECTED NOT DETECTED Final   Candida krusei NOT DETECTED NOT DETECTED Final   Candida parapsilosis NOT DETECTED NOT DETECTED Final   Candida tropicalis NOT DETECTED NOT DETECTED Final   Cryptococcus neoformans/gattii NOT DETECTED NOT DETECTED Final   Vancomycin resistance NOT DETECTED NOT DETECTED Final    Comment: Performed at [facility], [address] [place], [place] [address]  CULTURE, BLOOD (ROUTINE X 2) w Reflex to ID Panel     Status: None (Preliminary result)   Collection Time: 06/07/20  7:02 PM   Specimen: BLOOD  Result Value Ref Range Status   Specimen Description BLOOD BLOOD LEFT FOREARM  Final   Special Requests   Final    BOTTLES DRAWN AEROBIC AND ANAEROBIC Blood Culture results may not be optimal due to an inadequate volume of blood received in culture bottles   Culture   Final    NO GROWTH 2 DAYS Performed at [facility], [address] [place], [place] [address]    Report Status PENDING  Incomplete  CULTURE, BLOOD (ROUTINE X 2) w Reflex to ID Panel     Status: None (Preliminary result)   Collection Time: 06/07/20  8:46 PM   Specimen: BLOOD  Result Value Ref Range Status   Specimen Description BLOOD BLOOD LEFT HAND  Final  Special Requests   Final    BOTTLES DRAWN AEROBIC ONLY Blood Culture results may not be optimal due to an inadequate volume of blood received in culture bottles   Culture   Final    NO GROWTH 2 DAYS Performed  at [facility], [address] [place], [place] [address]    Report Status PENDING  Incomplete    Coagulation Studies: No results for input(s): LABPROT, INR in the last 72 hours.  Urinalysis: No results for input(s): COLORURINE, LABSPEC, PHURINE, GLUCOSEU, HGBUR, BILIRUBINUR, KETONESUR, PROTEINUR, UROBILINOGEN, NITRITE, LEUKOCYTESUR in the last 72 hours.  Invalid input(s): APPERANCEUR    Imaging: [place] UPPER EXTREMITY ARTERIAL RIGHT LIMITED (GRAFT, SINGLE VESSEL)  Result Date: 06/09/2020 CLINICAL DATA:  59 year old female with new swelling at the dialysis access. EXAM: RIGHT UPPER EXTREMITY ARTERIAL DUPLEX SCAN TECHNIQUE: Gray-scale sonography as well as color Doppler and duplex ultrasound was performed to evaluate a limited evaluation of the upper extremity. COMPARISON:  None. FINDINGS: Duplex performed in the region clinical concern. Flow confirmed within the dialysis circuit. No volume measurement or velocities were measured. Heterogeneous fluid present adjacent to the graft within the soft tissues. No internal color flow to suggest a patent pseudoaneurysm. Estimated dimension of the fluid 3.3 cm x 2.5 cm x 3.5 cm. IMPRESSION: Heterogeneous fluid collection adjacent to patent right upper extremity dialysis graft, with the differential including blood products/hematoma versus abscess given the history. Electronically Signed   By: Corrie Mckusick D.O.   On: 06/09/2020 10:23     Medications:   . ampicillin (OMNIPEN) IV 2 g (06/09/20 1243)  . [START ON 06/11/2020] gentamicin     . aspirin EC  81 mg Oral Daily  . atorvastatin  20 mg Oral Daily  . carvedilol  6.25 mg Oral BID WC  . Chlorhexidine Gluconate Cloth  6 each Topical Daily  . cholestyramine light  4 g Oral BID  . epoetin (EPOGEN/PROCRIT) injection  10,000 Units Intravenous Q M,W,F-HD  . ferrous sulfate  325 mg Oral Daily  . furosemide  80 mg Oral Daily  . gabapentin  100 mg Oral QPM  . heparin  5,000 Units Subcutaneous  Q8H  . hydrOXYzine  25 mg Oral Daily  . lipase/protease/amylase  12,000 Units Oral TID WC  . multivitamin  1 tablet Oral Daily  . sevelamer carbonate  800 mg Oral TID WC  . cyanocobalamin  1,000 mcg Oral Daily   acetaminophen **OR** acetaminophen, calcium carbonate, camphor-menthol **AND** hydrOXYzine, feeding supplement (NEPRO CARB STEADY), hydrALAZINE, ipratropium-albuterol, loperamide, morphine injection, naLOXone (NARCAN)  injection, ondansetron **OR** ondansetron (ZOFRAN) IV, oxyCODONE-acetaminophen, promethazine, sorbitol, zolpidem  Assessment/ Plan:  [name] is a 59 y.o.  female with past medical history of ESRD on HD MWF, hypertension, COPD, diastolic heart failure, depression, diabetes mellitus type 2, hyperlipidemia, anemia of chronic kidney disease, secondary hyperparathyroidism, new dialysis access placement 04/12/2020 who presented with missed dialysis treatment and volume overload.  [facility]/MWF3/[place]/81kg.  1.  ESRD on HD MWF/volume overload/pulmonary edema.   -Pt due for HD today.  Has small raised area at inferior aspect of access.  - discussed with vascular surgery; plan is to explant the hero graft and place a dialysis catheter  - once HD catheter is place we can proceed with HD.   2.  Anemia of chronic kidney disease.   Continue epogen 10000 units IV with HD.   3.  Secondary hyperparathyroidism.  Continue renvela 1 tab PO TID with meals   4.  Enterococcal bacetermia:  On ampicillin and gentamicin.     LOS: [place] 9/18/20211:33 PM   Patient was seen and examined with [facility]. Above plan was discussed and agreed upon on the signing of this note. Appreciate vascular input.  Hypertension is acceptable with carvedilol and furosemide.   Lavonia Dana, [facility]  9/18/20212:23 PM

## 2020-06-09 NOTE — Progress Notes (Signed)
Progress Note    [name]  QVZ:[id number] DOB: September 23, 1960  DOA: 06/02/2020 PCP: [facility]      Brief Narrative:    Medical records reviewed and are as summarized below:  [name] is a 59 y.o. female with past medical history for end-stage renal disease on hemodialysis, hypertension, diabetes mellitus, CAD, COPD, PVD admitted on 9/11 after coming in with complaints of chills, vomiting, shortness of breath and chest pain and found to be hypoxic.  Patient found to have new onset pulmonary edema, due to volume overload as well as COPD exacerbation.  Had recently missed dialysis.  Procalcitonin also elevated and patient thought to also have possible pneumonia.  Patient admitted to the hospitalist service and nephrology consulted.  Patient has continued receiving dialysis.  VQ scan done ruled out PE.     Assessment/Plan:   Principal Problem:   Bacteremia due to Enterococcus Active Problems:   Acute respiratory failure with hypoxia (HCC)   Anemia of chronic disease   Acute diastolic CHF (congestive heart failure) (HCC)   COPD (chronic obstructive pulmonary disease) (HCC)   ESRD on hemodialysis (HCC)   Benign essential HTN   Obesity (BMI 30-39.9)  Body mass index is 32.87 kg/m.  (Obesity)  Abscess over right arm AV graft   PLAN  Ultrasound of right upper extremity showed findings suspicious for abscess over right AV graft.  Appreciate help from vascular surgeon.  Plan for abscess drainage and graft explant early next week.  Analgesics as needed for pain.  Continue IV antibiotics for Enterococcus bacteremia  Consult cardiologist tomorrow for possible TEE on Monday, 06/11/2020  Continue antihypertensives  Continue bronchodilators for COPD  Follow-up with nephrologist for hemodialysis   Diet Order            Diet renal with fluid restriction Fluid restriction: 1200 mL Fluid; Room service appropriate? Yes; Fluid consistency: Thin   Diet effective now                    Consultants:  Nephrologist  ID specialist       Medications:   . aspirin EC  81 mg Oral Daily  . atorvastatin  20 mg Oral Daily  . carvedilol  6.25 mg Oral BID WC  . Chlorhexidine Gluconate Cloth  6 each Topical Daily  . cholestyramine light  4 g Oral BID  . epoetin (EPOGEN/PROCRIT) injection  10,000 Units Intravenous Q M,W,F-HD  . ferrous sulfate  325 mg Oral Daily  . furosemide  80 mg Oral Daily  . gabapentin  100 mg Oral QPM  . heparin  5,000 Units Subcutaneous Q8H  . hydrOXYzine  25 mg Oral Daily  . lipase/protease/amylase  12,000 Units Oral TID WC  . multivitamin  1 tablet Oral Daily  . sevelamer carbonate  800 mg Oral TID WC  . cyanocobalamin  1,000 mcg Oral Daily   Continuous Infusions: . ampicillin (OMNIPEN) IV 2 g (06/09/20 1243)  . [START ON 06/11/2020] gentamicin       Anti-infectives (From admission, onward)   Start     Dose/Rate Route Frequency Ordered Stop   06/11/20 1200  gentamicin (GARAMYCIN) IVPB 80 mg        80 mg 100 mL/hr over 30 Minutes Intravenous Every M-W-F (Hemodialysis) 06/08/20 1356     06/08/20 1800  gentamicin (GARAMYCIN) IVPB 100 mg  Status:  Discontinued        100 mg 200 mL/hr over 30  Minutes Intravenous  Once 06/08/20 1356 06/08/20 1425   06/08/20 1800  gentamicin (GARAMYCIN) IVPB 100 mg  Status:  Discontinued        100 mg 200 mL/hr over 30 Minutes Intravenous  Once 06/08/20 1425 06/08/20 1430   06/08/20 1800  gentamicin (GARAMYCIN) IVPB 100 mg  Status:  Discontinued        100 mg 200 mL/hr over 30 Minutes Intravenous  Once 06/08/20 1430 06/08/20 1441   06/08/20 1800  gentamicin (GARAMYCIN) 100 mg in dextrose 5 % 50 mL IVPB        100 mg 105 mL/hr over 30 Minutes Intravenous  Once 06/08/20 1441 06/08/20 1927   06/08/20 1500  ampicillin (OMNIPEN) 2 g in sodium chloride 0.9 % 100 mL IVPB        2 g 300 mL/hr over 20 Minutes Intravenous Every 12 hours 06/08/20 1356     06/07/20 2200   piperacillin-tazobactam (ZOSYN) IVPB 2.25 g  Status:  Discontinued        2.25 g 100 mL/hr over 30 Minutes Intravenous Every 8 hours 06/07/20 1657 06/08/20 1010   06/07/20 1400  ampicillin (OMNIPEN) 2 g in sodium chloride 0.9 % 100 mL IVPB  Status:  Discontinued        2 g 300 mL/hr over 20 Minutes Intravenous Every 8 hours 06/07/20 1025 06/07/20 1637   06/06/20 1600  piperacillin-tazobactam (ZOSYN) IVPB 2.25 g  Status:  Discontinued        2.25 g 100 mL/hr over 30 Minutes Intravenous Every 8 hours 06/06/20 1403 06/07/20 1025   06/03/20 1400  azithromycin (ZITHROMAX) 500 mg in sodium chloride 0.9 % 250 mL IVPB  Status:  Discontinued        500 mg 250 mL/hr over 60 Minutes Intravenous Every 24 hours 06/03/20 1322 06/06/20 1319   06/03/20 1400  cefTRIAXone (ROCEPHIN) 1 g in sodium chloride 0.9 % 100 mL IVPB  Status:  Discontinued        1 g 200 mL/hr over 30 Minutes Intravenous Every 24 hours 06/03/20 1322 06/06/20 1319             Family Communication/Anticipated D/C date and plan/Code Status   DVT prophylaxis: heparin injection 5,000 Units Start: 06/02/20 2300     Code Status: Full Code  Family Communication: Plan discussed with patient Disposition Plan:    Status is: Inpatient  Remains inpatient appropriate because:IV treatments appropriate due to intensity of illness or inability to take PO   Dispo: The patient is from: Home              Anticipated d/c is to: Home              Anticipated d/c date is: > 3 days              Patient currently is not medically stable to d/c.           Subjective:   She complains of pain on right distal arm/elbow around site of AV graft  Objective:    Vitals:   06/08/20 1555 06/08/20 1951 06/09/20 0503 06/09/20 1045  BP: (!) 174/58 (!) 166/53 (!) 149/50   Pulse: (!) 59 (!) 57 (!) 56   Resp: 18 18 18    Temp: 98.6 F (37 C) 98.2 F (36.8 C) 97.7 F (36.5 C) 98.7 F (37.1 C)  TempSrc: Oral Oral Oral   SpO2: 97% 97%  97%   Weight:      Height:  No data found.   Intake/Output Summary (Last 24 hours) at 06/09/2020 1527 Last data filed at 06/08/2020 2149 Gross per 24 hour  Intake 240 ml  Output --  Net 240 ml   Filed Weights   06/02/20 1056 06/05/20 0500 06/06/20 0800  Weight: 80.7 kg 82 kg 89.6 kg    Exam:  GEN: No acute distress SKIN: Warm and dry. EYES: Anicteric ENT: Mucous membranes CV: Regular rate and rhythm PULM: No wheezing or rales heard ABD: Soft, nontender CNS: Alert and oriented x3.  No focal deficits. EXT: Swelling/lump with tenderness and erythema noted on the right distal arm/elbow on the medial aspect.  Mild chronic bilateral lower extremity tenderness and edema.  Data Reviewed:   I have personally reviewed following labs and imaging studies:  Labs: Labs show the following:   Basic Metabolic Panel: Recent Labs  Lab 06/02/20 1907 06/02/20 1907 06/03/20 0744 06/04/20 0658 06/04/20 0658 06/05/20 1325 06/06/20 0612 06/08/20 1722  NA 137  --   --  136  --  134* 131*  --   K 4.2   < >  --  3.7   < > 3.8 3.7  --   CL 98  --   --  97*  --  93* 93*  --   CO2 28  --   --  30  --  27 27  --   GLUCOSE 132*  --   --  98  --  268* 239*  --   BUN 25*  --   --  17  --  15 18  --   CREATININE 6.68*  --  4.56* 4.80*  --  4.30* 5.03*  --   CALCIUM 8.7*  --   --  7.8*  --  7.8* 7.8*  --   PHOS  --   --   --   --   --   --   --  1.9*   < > = values in this interval not displayed.   GFR Estimated Creatinine Clearance: 13 mL/min (A) (by C-G formula based on SCr of 5.03 mg/dL (H)). Liver Function Tests: No results for input(s): AST, ALT, ALKPHOS, BILITOT, PROT, ALBUMIN in the last 168 hours. No results for input(s): LIPASE, AMYLASE in the last 168 hours. No results for input(s): AMMONIA in the last 168 hours. Coagulation profile No results for input(s): INR, PROTIME in the last 168 hours.  CBC: Recent Labs  Lab 06/02/20 1907 06/03/20 0744 06/04/20 0658  06/05/20 1325 06/06/20 0612  WBC 5.4 8.1 3.3* 8.3 5.7  NEUTROABS 2.0  --   --   --   --   HGB 8.9* 8.0* 7.7* 8.2* 8.2*  HCT 26.5* 24.9* 23.8* 27.0* 25.2*  MCV 88.6 90.2 89.1 93.8 89.7  PLT 130* 118* 71* 77* 78*   Cardiac Enzymes: No results for input(s): CKTOTAL, CKMB, CKMBINDEX, TROPONINI in the last 168 hours. BNP (last 3 results) No results for input(s): PROBNP in the last 8760 hours. CBG: Recent Labs  Lab 06/08/20 1552 06/08/20 1949 06/09/20 0000 06/09/20 0501 06/09/20 0759  GLUCAP 127* 127* 147* 96 97   D-Dimer: No results for input(s): DDIMER in the last 72 hours. Hgb A1c: No results for input(s): HGBA1C in the last 72 hours. Lipid Profile: No results for input(s): CHOL, HDL, LDLCALC, TRIG, CHOLHDL, LDLDIRECT in the last 72 hours. Thyroid function studies: No results for input(s): TSH, T4TOTAL, T3FREE, THYROIDAB in the last 72 hours.  Invalid input(s): FREET3 Anemia work up:  No results for input(s): VITAMINB12, FOLATE, FERRITIN, TIBC, IRON, RETICCTPCT in the last 72 hours. Sepsis Labs: Recent Labs  Lab 06/03/20 0744 06/03/20 1404 06/04/20 0658 06/05/20 1325 06/06/20 0612  PROCALCITON  --  20.16  --   --  22.77  WBC 8.1  --  3.3* 8.3 5.7    Microbiology Recent Results (from the past 240 hour(s))  SARS Coronavirus 2 by RT PCR (hospital order, performed in [facility] hospital lab) Nasopharyngeal Nasopharyngeal Swab     Status: None   Collection Time: 06/03/20  6:49 AM   Specimen: Nasopharyngeal Swab  Result Value Ref [place] Status   SARS Coronavirus 2 NEGATIVE NEGATIVE Final    Comment: (NOTE) SARS-CoV-2 target nucleic acids are NOT DETECTED.  The SARS-CoV-2 RNA is generally detectable in upper and lower respiratory specimens during the acute phase of infection. The lowest concentration of SARS-CoV-2 viral copies this assay can detect is 250 copies / mL. A negative result does not preclude SARS-CoV-2 infection and should not be used as the sole basis  for treatment or other patient management decisions.  A negative result may occur with improper specimen collection / handling, submission of specimen other than nasopharyngeal swab, presence of viral mutation(s) within the areas targeted by this assay, and inadequate number of viral copies (<250 copies / mL). A negative result must be combined with clinical observations, patient history, and epidemiological information.  Fact Sheet for Patients:   StrictlyIdeas.no  Fact Sheet for Healthcare Providers: BankingDealers.co.za  This test is not yet approved or  cleared by the [place] FDA and has been authorized for detection and/or diagnosis of SARS-CoV-2 by FDA under an Emergency Use Authorization (EUA).  This EUA will remain in effect (meaning this test can be used) for the duration of the COVID-19 declaration under Section 564(b)(1) of the Act, 21 U.S.C. section 360bbb-3(b)(1), unless the authorization is terminated or revoked sooner.  Performed at [facility], [address] [place], [place] [address]   Gastrointestinal Panel by PCR , Stool     Status: None   Collection Time: 06/04/20  7:39 PM   Specimen: Stool  Result Value Ref [place] Status   Campylobacter species NOT DETECTED NOT DETECTED Final   Plesimonas shigelloides NOT DETECTED NOT DETECTED Final   Salmonella species NOT DETECTED NOT DETECTED Final   Yersinia enterocolitica NOT DETECTED NOT DETECTED Final   Vibrio species NOT DETECTED NOT DETECTED Final   Vibrio cholerae NOT DETECTED NOT DETECTED Final   Enteroaggregative E coli (EAEC) NOT DETECTED NOT DETECTED Final   Enteropathogenic E coli (EPEC) NOT DETECTED NOT DETECTED Final   Enterotoxigenic E coli (ETEC) NOT DETECTED NOT DETECTED Final   Shiga like toxin producing E coli (STEC) NOT DETECTED NOT DETECTED Final   Shigella/Enteroinvasive E coli (EIEC) NOT DETECTED NOT DETECTED Final   Cryptosporidium  NOT DETECTED NOT DETECTED Final   Cyclospora cayetanensis NOT DETECTED NOT DETECTED Final   Entamoeba histolytica NOT DETECTED NOT DETECTED Final   Giardia lamblia NOT DETECTED NOT DETECTED Final   Adenovirus F40/41 NOT DETECTED NOT DETECTED Final   Astrovirus NOT DETECTED NOT DETECTED Final   Norovirus GI/GII NOT DETECTED NOT DETECTED Final   Rotavirus A NOT DETECTED NOT DETECTED Final   Sapovirus (I, II, IV, and V) NOT DETECTED NOT DETECTED Final    Comment: Performed at [facility], [address] [place], [place] [address]  Culture, blood (Routine X 2) w Reflex to ID Panel     Status:  Abnormal   Collection Time: 06/04/20  8:49 PM   Specimen: BLOOD  Result Value Ref Range Status   Specimen Description   Final    BLOOD BLOOD LEFT FOREARM Performed at [facility], [address], [place], [place] [address]    Special Requests   Final    BOTTLES DRAWN AEROBIC AND ANAEROBIC Blood Culture adequate volume Performed at [facility], [address] [place], [place] [address]    Culture  Setup Time   Final    IN BOTH AEROBIC AND ANAEROBIC BOTTLES GRAM POSITIVE COCCI Organism ID to follow CRITICAL RESULT CALLED TO, READ BACK BY AND VERIFIED WITH: CHARLES SHANLEVER @1636  06/06/20 MJU LISA KLUTTZ AT 2297 ON 06/07/2020 [place]. Performed at [facility], [place] [address], [place], [place] [address]    Culture ENTEROCOCCUS FAECALIS (A)  Final   Report Status 06/08/2020 FINAL  Final   Organism ID, Bacteria ENTEROCOCCUS FAECALIS  Final      Susceptibility   Enterococcus faecalis - MIC*    AMPICILLIN <=2 SENSITIVE Sensitive     VANCOMYCIN 1 SENSITIVE Sensitive     GENTAMICIN SYNERGY SENSITIVE Sensitive     * ENTEROCOCCUS FAECALIS  Blood Culture ID Panel (Reflexed)     Status: Abnormal   Collection Time: 06/04/20  8:49 PM  Result Value Ref [place] Status   Enterococcus faecalis DETECTED (A) NOT DETECTED Final    Comment: CRITICAL RESULT CALLED TO, READ BACK  BY AND VERIFIED WITH: CHARLES SHANLEVER @1636  06/06/20 MJU    Enterococcus Faecium NOT DETECTED NOT DETECTED Final   Listeria monocytogenes NOT DETECTED NOT DETECTED Final   Staphylococcus species NOT DETECTED NOT DETECTED Final   Staphylococcus aureus (BCID) NOT DETECTED NOT DETECTED Final   Staphylococcus epidermidis NOT DETECTED NOT DETECTED Final   Staphylococcus lugdunensis NOT DETECTED NOT DETECTED Final   Streptococcus species NOT DETECTED NOT DETECTED Final   Streptococcus agalactiae NOT DETECTED NOT DETECTED Final   Streptococcus pneumoniae NOT DETECTED NOT DETECTED Final   Streptococcus pyogenes NOT DETECTED NOT DETECTED Final   A.calcoaceticus-baumannii NOT DETECTED NOT DETECTED Final   Bacteroides fragilis NOT DETECTED NOT DETECTED Final   Enterobacterales NOT DETECTED NOT DETECTED Final   Enterobacter cloacae complex NOT DETECTED NOT DETECTED Final   Escherichia coli NOT DETECTED NOT DETECTED Final   Klebsiella aerogenes NOT DETECTED NOT DETECTED Final   Klebsiella oxytoca NOT DETECTED NOT DETECTED Final   Klebsiella pneumoniae NOT DETECTED NOT DETECTED Final   Proteus species NOT DETECTED NOT DETECTED Final   Salmonella species NOT DETECTED NOT DETECTED Final   Serratia marcescens NOT DETECTED NOT DETECTED Final   Haemophilus influenzae NOT DETECTED NOT DETECTED Final   Neisseria meningitidis NOT DETECTED NOT DETECTED Final   Pseudomonas aeruginosa NOT DETECTED NOT DETECTED Final   Stenotrophomonas maltophilia NOT DETECTED NOT DETECTED Final   Candida albicans NOT DETECTED NOT DETECTED Final   Candida auris NOT DETECTED NOT DETECTED Final   Candida glabrata NOT DETECTED NOT DETECTED Final   Candida krusei NOT DETECTED NOT DETECTED Final   Candida parapsilosis NOT DETECTED NOT DETECTED Final   Candida tropicalis NOT DETECTED NOT DETECTED Final   Cryptococcus neoformans/gattii NOT DETECTED NOT DETECTED Final   Vancomycin resistance NOT DETECTED NOT DETECTED Final     Comment: Performed at [facility], [address] [place], [place] [address]  Culture, blood (Routine X 2) w Reflex to ID Panel     Status: Abnormal   Collection Time: 06/05/20  1:25  PM   Specimen: BLOOD  Result Value Ref [place] Status   Specimen Description   Final    BLOOD LAC Performed at [facility], [address] [place], [place] [address]    Special Requests   Final    BOTTLES DRAWN AEROBIC AND ANAEROBIC Blood Culture adequate volume Performed at [facility], [address] [place], [place] [address]    Culture  Setup Time   Final    Organism ID to follow ANAEROBIC BOTTLE ONLY CRITICAL RESULT CALLED TO, READ BACK BY AND VERIFIED WITH: JASON ROBBINS 06/07/20 AT 1506 BY ACR GRAM POSITIVE COCCI PREVIOUSLY REPORTED AS: GRAM NEGATIVE COCCOBACILLI CORRECTED RESULTS CALLED TO: D,Ziegler PHARMD, at [place] 06/07/20 BY E. BENTON    Culture (A)  Final    ENTEROCOCCUS FAECALIS SUSCEPTIBILITIES PERFORMED ON PREVIOUS CULTURE WITHIN THE LAST 5 DAYS. Performed at [facility], [place] [address] [place], [place] [address]    Report Status 06/08/2020 FINAL  Final  Blood Culture ID Panel (Reflexed)     Status: Abnormal   Collection Time: 06/05/20  1:25 PM  Result Value Ref Range Status   Enterococcus faecalis DETECTED (A) NOT DETECTED Final    Comment: CRITICAL RESULT CALLED TO, READ BACK BY AND VERIFIED WITH: JASON ROBBINS 06/07/20 AT 1506 BY ACR    Enterococcus Faecium NOT DETECTED NOT DETECTED Final   Listeria monocytogenes NOT DETECTED NOT DETECTED Final   Staphylococcus species NOT DETECTED NOT DETECTED Final   Staphylococcus aureus (BCID) NOT DETECTED NOT DETECTED Final   Staphylococcus epidermidis NOT DETECTED NOT DETECTED Final   Staphylococcus lugdunensis NOT DETECTED NOT DETECTED Final   Streptococcus species NOT DETECTED NOT DETECTED Final   Streptococcus agalactiae NOT DETECTED NOT DETECTED Final   Streptococcus pneumoniae NOT DETECTED NOT  DETECTED Final   Streptococcus pyogenes NOT DETECTED NOT DETECTED Final   A.calcoaceticus-baumannii NOT DETECTED NOT DETECTED Final   Bacteroides fragilis NOT DETECTED NOT DETECTED Final   Enterobacterales NOT DETECTED NOT DETECTED Final   Enterobacter cloacae complex NOT DETECTED NOT DETECTED Final   Escherichia coli NOT DETECTED NOT DETECTED Final   Klebsiella aerogenes NOT DETECTED NOT DETECTED Final   Klebsiella oxytoca NOT DETECTED NOT DETECTED Final   Klebsiella pneumoniae NOT DETECTED NOT DETECTED Final   Proteus species NOT DETECTED NOT DETECTED Final   Salmonella species NOT DETECTED NOT DETECTED Final   Serratia marcescens NOT DETECTED NOT DETECTED Final   Haemophilus influenzae NOT DETECTED NOT DETECTED Final   Neisseria meningitidis NOT DETECTED NOT DETECTED Final   Pseudomonas aeruginosa NOT DETECTED NOT DETECTED Final   Stenotrophomonas maltophilia NOT DETECTED NOT DETECTED Final   Candida albicans NOT DETECTED NOT DETECTED Final   Candida auris NOT DETECTED NOT DETECTED Final   Candida glabrata NOT DETECTED NOT DETECTED Final   Candida krusei NOT DETECTED NOT DETECTED Final   Candida parapsilosis NOT DETECTED NOT DETECTED Final   Candida tropicalis NOT DETECTED NOT DETECTED Final   Cryptococcus neoformans/gattii NOT DETECTED NOT DETECTED Final   Vancomycin resistance NOT DETECTED NOT DETECTED Final    Comment: Performed at [facility], [address] [place], [place] [address]  CULTURE, BLOOD (ROUTINE X 2) w Reflex to ID Panel     Status: None (Preliminary result)   Collection Time: 06/07/20  7:02 PM   Specimen: BLOOD  Result Value Ref [place] Status   Specimen Description BLOOD BLOOD LEFT FOREARM  Final   Special Requests   Final    BOTTLES DRAWN AEROBIC  AND ANAEROBIC Blood Culture results may not be optimal due to an inadequate volume of blood received in culture bottles   Culture   Final    NO GROWTH 2 DAYS Performed at [facility], [address] [place], [place] [address]    Report Status PENDING  Incomplete  CULTURE, BLOOD (ROUTINE X 2) w Reflex to ID Panel     Status: None (Preliminary result)   Collection Time: 06/07/20  8:46 PM   Specimen: BLOOD  Result Value Ref [place] Status   Specimen Description BLOOD BLOOD LEFT HAND  Final   Special Requests   Final    BOTTLES DRAWN AEROBIC ONLY Blood Culture results may not be optimal due to an inadequate volume of blood received in culture bottles   Culture   Final    NO GROWTH 2 DAYS Performed at [facility], [address] [place], [place] [address]    Report Status PENDING  Incomplete    Procedures and diagnostic studies:  [place] UPPER EXTREMITY ARTERIAL RIGHT LIMITED (GRAFT, SINGLE VESSEL)  Result Date: 06/09/2020 CLINICAL DATA:  59 year old female with new swelling at the dialysis access. EXAM: RIGHT UPPER EXTREMITY ARTERIAL DUPLEX SCAN TECHNIQUE: Gray-scale sonography as well as color Doppler and duplex ultrasound was performed to evaluate a limited evaluation of the upper extremity. COMPARISON:  None. FINDINGS: Duplex performed in the region clinical concern. Flow confirmed within the dialysis circuit. No volume measurement or velocities were measured. Heterogeneous fluid present adjacent to the graft within the soft tissues. No internal color flow to suggest a patent pseudoaneurysm. Estimated dimension of the fluid 3.3 cm x 2.5 cm x 3.5 cm. IMPRESSION: Heterogeneous fluid collection adjacent to patent right upper extremity dialysis graft, with the differential including blood products/hematoma versus abscess given the history. Electronically Signed   By: Corrie Mckusick D.O.   On: 06/09/2020 10:23               LOS: 6 days   [name]  [facility]   Pager on [email or web address]. If 7PM-7AM, please contact night-coverage at [email or web address]     06/09/2020, 3:27 PM

## 2020-06-09 NOTE — Procedures (Signed)
  OPERATIVE NOTE   PROCEDURE: 1. Ultrasound guidance for vascular access Right femoral vein 2. Placement of a temporary 13 fr/20cm dialysis catheter right femoral vein  PRE-OPERATIVE DIAGNOSIS: 1. ESRD 2. Infected hERO graft  POST-OPERATIVE DIAGNOSIS: Same  SURGEON: Jamesetta So, MD  ASSISTANT(S): None  ANESTHESIA: local  ESTIMATED BLOOD LOSS: Minimal   FINDING(S): 1. None  SPECIMEN(S): None  INDICATIONS:  Patient is a 59 y.o.female with ESRD. Now with an infected hERO graft and bacteremia. Now requiring temporary HD access.  Risks and benefits were discussed, and informed consent was obtained..  DESCRIPTION: After obtaining full informed written consent, the patient was laid flat in the bed. The right groin was sterilely prepped and draped in a sterile surgical field was created. The right femoral  vein was visualized with ultrasound and found to be widely patent. It was then accessed under direct guidance without difficulty with a Seldinger needle and a permanent image was recorded. A J-wire was then placed. After skin nick and dilatation, a 13 Fr/20cm dialysis catheter was placed over the wire and the wire was removed. The lumens withdrew dark red nonpulsatile blood and flushed easily with sterile saline. The catheter was secured to the skin with 3 nylon sutures. Sterile dressing was placed.  COMPLICATIONS: None  CONDITION: Stable  Meryn Sarracino A 06/09/2020 2:11 PM  This note was created with Dragon Medical transcription system. Any errors in dictation are purely unintentional.

## 2020-06-09 NOTE — Consult Note (Signed)
Pharmacy Antibiotic Note  [name] is a 59 y.o. female admitted on 06/02/2020 with respiratory failure secondary to acute diastolic CHF and COPD exacerbation. PMH includes ESRD on HD, HTN, DM, CAD, PCD. Pharmacy has been consulted for amplicillin and gentamicin dosing for possible infective endocarditis.   Patient with ESRD on HD with numerous graft complications. Erythemous firm swelling in right cubital area of graft arm. Major criteria include + blood cx collected >12hrs apart. Minor [facility] IE criteria includes fever >38C, predisposing heart disease (CHF), microbiological evidence (enterococcus facaelis detected on BCID). Graft site noted as possible source of bloodstream infection.    Cultures this admission:  BCx 9/13: Enterococcus faecalis, amp sensitive, gent synergy sensitive BCx 9/14: Enterococcus faecalis  BCID 9/14: Enterococcus faecalis detected  BCx 9/16: NGTD in 4/4 bottles  Plan:  Initiate ampicillin 2g every 12 hours today.    Initiate gentamicin loading dose of 100 mg given 9/17 PM, followed by a maintenance dose of 80 mg on days of dialysis following HD sessions.   Monitor gentamicin peak 30 minutes after the second dose (goal peak: 3-4 mg/L) and a gentamicin trough prior to HD on day of the third dose (goal trough: <1 mg/L).   Monitor drug levels weekly thereafter to assess for accumulation as patient is tolerating full dialysis sessions.   Monitor signs of ototoxicity (I.e. ringing in ears) daily.    9/18 - Pt unable to get HD yesterday due to clots from access. Once HD catheter is place, looks like plan for HD today. Spoke with HD RN and Nephrologist. Plan for HD tomorrow (Sunday). Will need gentamicin 80 mg after HD tomorrow. Usual HD schedule MWF.    Height: 5\' 5"  (165.1 cm) Weight: 89.6 kg (197 lb 8.5 oz) IBW/kg (Calculated) : 57  Temp (24hrs), Avg:98.3 F (36.8 C), Min:97.7 F (36.5 C), Max:98.7 F (37.1 C)  Recent Labs  Lab 06/02/20 1907 06/03/20 0744  06/04/20 0658 06/05/20 1325 06/06/20 0612  WBC 5.4 8.1 3.3* 8.3 5.7  CREATININE 6.68* 4.56* 4.80* 4.30* 5.03*    Estimated Creatinine Clearance: 13 mL/min (A) (by C-G formula based on SCr of 5.03 mg/dL (H)).    Allergies  Allergen Reactions  . Ace Inhibitors Swelling and Anaphylaxis  . Ativan [Lorazepam] Other (See Comments)    Reaction:Hallucinations and headaches  . Compazine [Prochlorperazine Edisylate] Anaphylaxis, Nausea And Vomiting and Other (See Comments)    Other reaction(s): dystonia from this vs. Reglan, 23 Jul - patient relates that she takes promethazine frequently with no problems  . Sumatriptan Succinate Other (See Comments)    Other reaction(s): delirium and hallucinations per [facility] records  . Zofran [Ondansetron] Nausea And Vomiting    Per pt. she is allergic to zofran or will experience adverse reaction like hallucination   . Lac Bovis Nausea And Vomiting  . Losartan Nausea Only  . Prochlorperazine Other (See Comments)    Reaction:  Unknown . Patient does not remember reaction but she does have vertigo and anxiety along with n and v at times. Could be used to treat any of these   . Reglan [Metoclopramide] Other (See Comments)    Per patient her Dr. Evelina Bucy her off it   . Scopolamine Other (See Comments)    Dizziness, also has vertigo already  . Tape Rash    Plastic tape causes rash  . Tapentadol Rash    Antimicrobials this admission: Azithromycin 9/12 >> 9/15 Ceftriaxone 9/12 >> 9/15  Zosyn 9/15 >> 9/17  Ampicillin 9/17 >>  Gentamicin 9/17 >>    Thank you for allowing pharmacy to be a part of this patient's care.  Rocky Morel 06/09/2020 3:10 PM

## 2020-06-10 LAB — GLUCOSE, CAPILLARY
Glucose-Capillary: 113 mg/dL — ABNORMAL HIGH (ref 70–99)
Glucose-Capillary: 114 mg/dL — ABNORMAL HIGH (ref 70–99)
Glucose-Capillary: 122 mg/dL — ABNORMAL HIGH (ref 70–99)
Glucose-Capillary: 122 mg/dL — ABNORMAL HIGH (ref 70–99)
Glucose-Capillary: 85 mg/dL (ref 70–99)

## 2020-06-10 LAB — CBC
HCT: 25.3 % — ABNORMAL LOW (ref 36.0–46.0)
Hemoglobin: 8.4 g/dL — ABNORMAL LOW (ref 12.0–15.0)
MCH: 28.8 pg (ref 26.0–34.0)
MCHC: 33.2 g/dL (ref 30.0–36.0)
MCV: 86.6 fL (ref 80.0–100.0)
Platelets: 187 10*3/uL (ref 150–400)
RBC: 2.92 MIL/uL — ABNORMAL LOW (ref 3.87–5.11)
RDW: 16.8 % — ABNORMAL HIGH (ref 11.5–15.5)
WBC: 4.6 10*3/uL (ref 4.0–10.5)
nRBC: 0 % (ref 0.0–0.2)

## 2020-06-10 LAB — RENAL FUNCTION PANEL
Albumin: 2.7 g/dL — ABNORMAL LOW (ref 3.5–5.0)
Anion gap: 12 (ref 5–15)
BUN: 12 mg/dL (ref 8–23)
CO2: 27 mmol/L (ref 22–32)
Calcium: 7.8 mg/dL — ABNORMAL LOW (ref 8.9–10.3)
Chloride: 96 mmol/L — ABNORMAL LOW (ref 98–111)
Creatinine, Ser: 4.04 mg/dL — ABNORMAL HIGH (ref 0.44–1.00)
GFR calc Af Amer: 13 mL/min — ABNORMAL LOW (ref >60)
GFR calc non Af Amer: 11 mL/min — ABNORMAL LOW (ref >60)
Glucose, Bld: 98 mg/dL (ref 70–99)
Phosphorus: 1.5 mg/dL — ABNORMAL LOW (ref 2.5–4.6)
Potassium: 2.9 mmol/L — ABNORMAL LOW (ref 3.5–5.1)
Sodium: 135 mmol/L (ref 135–145)

## 2020-06-10 MED ORDER — GENTAMICIN IN SALINE 1.6-0.9 MG/ML-% IV SOLN
80.0000 mg | Freq: Once | INTRAVENOUS | Status: AC
  Administered 2020-06-11: 80 mg via INTRAVENOUS
  Filled 2020-06-10: qty 50

## 2020-06-10 NOTE — Plan of Care (Signed)
Continuing with plan of care. 

## 2020-06-10 NOTE — Consult Note (Addendum)
Pharmacy Antibiotic Note  [name] is a 59 y.o. female admitted on 06/02/2020 with respiratory failure secondary to acute diastolic CHF and COPD exacerbation. PMH includes ESRD on HD, HTN, DM, CAD, PCD. Pharmacy has been consulted for amplicillin and gentamicin dosing for possible infective endocarditis.   Patient with ESRD on HD with numerous graft complications. Erythemous firm swelling in right cubital area of graft arm. Major criteria include + blood cx collected >12hrs apart. Minor [facility] IE criteria includes fever >38C, predisposing heart disease (CHF), microbiological evidence (enterococcus facaelis detected on BCID). Graft site noted as possible source of bloodstream infection.    Cultures this admission:  BCx 9/13: Enterococcus faecalis, amp sensitive, gent synergy sensitive BCx 9/14: Enterococcus faecalis  BCID 9/14: Enterococcus faecalis detected  BCx 9/16: NGTD in 4/4 bottles  Plan:  Continue ampicillin 2g every 12 hours   Gentamicin loading dose of 100 mg given 9/17 PM, followed by a maintenance dose of 80 mg on days of dialysis following HD sessions.   Monitor gentamicin peak 30 minutes after the second dose (goal peak: 3-4 mg/L) and a gentamicin trough prior to HD on day of the third dose (goal trough: <1 mg/L).   Monitor drug levels weekly thereafter to assess for accumulation as patient is tolerating full dialysis sessions.   Monitor signs of ototoxicity (I.e. ringing in ears) daily.    9/18 - Pt unable to get HD yesterday due to clots from access. Once HD catheter is place, looks like plan for HD today. Spoke with HD RN and Nephrologist. Plan for HD tomorrow (Sunday). Will need gentamicin 80 mg after HD tomorrow. Usual HD schedule MWF.   9/19 - Per Nephrology note, planning for HD today. If pt gets HD today, will need to order gentamicin 80 mg IV x1 for after HD today.     Height: 5\' 5"  (165.1 cm) Weight: 86.4 kg (190 lb 7.6 oz) IBW/kg (Calculated) : 57  Temp  (24hrs), Avg:98 F (36.7 C), Min:97.6 F (36.4 C), Max:98.5 F (36.9 C)  Recent Labs  Lab 06/04/20 0658 06/05/20 1325 06/06/20 0612  WBC 3.3* 8.3 5.7  CREATININE 4.80* 4.30* 5.03*    Estimated Creatinine Clearance: 12.8 mL/min (A) (by C-G formula based on SCr of 5.03 mg/dL (H)).    Allergies  Allergen Reactions  . Ace Inhibitors Swelling and Anaphylaxis  . Ativan [Lorazepam] Other (See Comments)    Reaction:Hallucinations and headaches  . Compazine [Prochlorperazine Edisylate] Anaphylaxis, Nausea And Vomiting and Other (See Comments)    Other reaction(s): dystonia from this vs. Reglan, 23 Jul - patient relates that she takes promethazine frequently with no problems  . Sumatriptan Succinate Other (See Comments)    Other reaction(s): delirium and hallucinations per [facility] records  . Zofran [Ondansetron] Nausea And Vomiting    Per pt. she is allergic to zofran or will experience adverse reaction like hallucination   . Lac Bovis Nausea And Vomiting  . Losartan Nausea Only  . Prochlorperazine Other (See Comments)    Reaction:  Unknown . Patient does not remember reaction but she does have vertigo and anxiety along with n and v at times. Could be used to treat any of these   . Reglan [Metoclopramide] Other (See Comments)    Per patient her Dr. Evelina Bucy her off it   . Scopolamine Other (See Comments)    Dizziness, also has vertigo already  . Tape Rash    Plastic tape causes rash  . Tapentadol Rash    Antimicrobials  this admission: Azithromycin 9/12 >> 9/15 Ceftriaxone 9/12 >> 9/15  Zosyn 9/15 >> 9/17  Ampicillin 9/17 >>  Gentamicin 9/17 >>    Thank you for allowing pharmacy to be a part of this patient's care.  Rocky Morel 06/10/2020 12:46 PM

## 2020-06-10 NOTE — Progress Notes (Addendum)
[facility]  ROUNDING NOTE   Subjective:   Patient reported nausea this morning that has now improved.  Currently eating a bagel.  Denies any shortness of breath or edema.   Objective:  Vital signs in last 24 hours:  Temp:  [97.6 F (36.4 C)-98.5 F (36.9 C)] 98.1 F (36.7 C) (09/19 1218) Pulse Rate:  [55-59] 55 (09/19 1218) Resp:  [15-18] 18 (09/19 1218) BP: (148-171)/(40-55) 148/40 (09/19 1218) SpO2:  [96 %-98 %] 98 % (09/19 1218) Weight:  [86.4 kg] 86.4 kg (09/19 0500)  Weight change:  Filed Weights   06/05/20 0500 06/06/20 0800 06/10/20 0500  Weight: 82 kg 89.6 kg 86.4 kg    Intake/Output: I/O last 3 completed shifts: In: 802.5 [P.O.:480; IV Piggyback:322.5] Out: 0    Intake/Output this shift:  No intake/output data recorded.  Physical Exam: General: NAD,   Head: Normocephalic, atraumatic. Moist oral mucosal membranes  Eyes: Anicteric, PERRL  Neck: Supple, trachea midline  Lungs:  Clear to auscultation  Heart: Regular rate and rhythm  Abdomen:  Soft, nontender,   Extremities:  trace peripheral edema.  Neurologic: Nonfocal, moving all four extremities  Skin: No lesions  Access: Right Hero graft, tender and erythematous swollen area. Right femoral temporary HD catheter     Basic Metabolic Panel: Recent Labs  Lab 06/04/20 0658 06/05/20 1325 06/06/20 0612 06/08/20 1722  NA 136 134* 131*  --   K 3.7 3.8 3.7  --   CL 97* 93* 93*  --   CO2 30 27 27   --   GLUCOSE 98 268* 239*  --   BUN 17 15 18   --   CREATININE 4.80* 4.30* 5.03*  --   CALCIUM 7.8* 7.8* 7.8*  --   PHOS  --   --   --  1.9*    Liver Function Tests: No results for input(s): AST, ALT, ALKPHOS, BILITOT, PROT, ALBUMIN in the last 168 hours. No results for input(s): LIPASE, AMYLASE in the last 168 hours. No results for input(s): AMMONIA in the last 168 hours.  CBC: Recent Labs  Lab 06/04/20 0658 06/05/20 1325 06/06/20 0612  WBC 3.3* 8.3 5.7  HGB 7.7* 8.2* 8.2*  HCT  23.8* 27.0* 25.2*  MCV 89.1 93.8 89.7  PLT 71* 77* 78*    Cardiac Enzymes: No results for input(s): CKTOTAL, CKMB, CKMBINDEX, TROPONINI in the last 168 hours.  BNP: Invalid input(s): POCBNP  CBG: Recent Labs  Lab 06/09/20 2054 06/10/20 0049 06/10/20 0445 06/10/20 0849 06/10/20 1213  GLUCAP 114* 122* 122* 113* 114*    Microbiology: Results for orders placed or performed during the hospital encounter of 06/02/20  SARS Coronavirus 2 by RT PCR (hospital order, performed in [facility] hospital lab) Nasopharyngeal Nasopharyngeal Swab     Status: None   Collection Time: 06/03/20  6:49 AM   Specimen: Nasopharyngeal Swab  Result Value Ref Range Status   SARS Coronavirus 2 NEGATIVE NEGATIVE Final    Comment: (NOTE) SARS-CoV-2 target nucleic acids are NOT DETECTED.  The SARS-CoV-2 RNA is generally detectable in upper and lower respiratory specimens during the acute phase of infection. The lowest concentration of SARS-CoV-2 viral copies this assay can detect is 250 copies / mL. A negative result does not preclude SARS-CoV-2 infection and should not be used as the sole basis for treatment or other patient management decisions.  A negative result may occur with improper specimen collection / handling, submission of specimen other than nasopharyngeal swab, presence of viral mutation(s) within the  areas targeted by this assay, and inadequate number of viral copies (<250 copies / mL). A negative result must be combined with clinical observations, patient history, and epidemiological information.  Fact Sheet for Patients:   StrictlyIdeas.no  Fact Sheet for Healthcare Providers: BankingDealers.co.za  This test is not yet approved or  cleared by the [place] FDA and has been authorized for detection and/or diagnosis of SARS-CoV-2 by FDA under an Emergency Use Authorization (EUA).  This EUA will remain in effect (meaning this test  can be used) for the duration of the COVID-19 declaration under Section 564(b)(1) of the Act, 21 U.S.C. section 360bbb-3(b)(1), unless the authorization is terminated or revoked sooner.  Performed at [facility], [address] [place], [place] [address]   Gastrointestinal Panel by PCR , Stool     Status: None   Collection Time: 06/04/20  7:39 PM   Specimen: Stool  Result Value Ref Range Status   Campylobacter species NOT DETECTED NOT DETECTED Final   Plesimonas shigelloides NOT DETECTED NOT DETECTED Final   Salmonella species NOT DETECTED NOT DETECTED Final   Yersinia enterocolitica NOT DETECTED NOT DETECTED Final   Vibrio species NOT DETECTED NOT DETECTED Final   Vibrio cholerae NOT DETECTED NOT DETECTED Final   Enteroaggregative E coli (EAEC) NOT DETECTED NOT DETECTED Final   Enteropathogenic E coli (EPEC) NOT DETECTED NOT DETECTED Final   Enterotoxigenic E coli (ETEC) NOT DETECTED NOT DETECTED Final   Shiga like toxin producing E coli (STEC) NOT DETECTED NOT DETECTED Final   Shigella/Enteroinvasive E coli (EIEC) NOT DETECTED NOT DETECTED Final   Cryptosporidium NOT DETECTED NOT DETECTED Final   Cyclospora cayetanensis NOT DETECTED NOT DETECTED Final   Entamoeba histolytica NOT DETECTED NOT DETECTED Final   Giardia lamblia NOT DETECTED NOT DETECTED Final   Adenovirus F40/41 NOT DETECTED NOT DETECTED Final   Astrovirus NOT DETECTED NOT DETECTED Final   Norovirus GI/GII NOT DETECTED NOT DETECTED Final   Rotavirus A NOT DETECTED NOT DETECTED Final   Sapovirus (I, II, IV, and V) NOT DETECTED NOT DETECTED Final    Comment: Performed at [facility], [address] [place], [place] [address]  Culture, blood (Routine X 2) w Reflex to ID Panel     Status: Abnormal   Collection Time: 06/04/20  8:49 PM   Specimen: BLOOD  Result Value Ref Range Status   Specimen Description   Final    BLOOD BLOOD LEFT FOREARM Performed at [facility], [address] [place], [place] [address]    Special Requests   Final    BOTTLES DRAWN AEROBIC AND ANAEROBIC Blood Culture adequate volume Performed at [facility], [address] [place], [place] [address]    Culture  Setup Time   Final    IN BOTH AEROBIC AND ANAEROBIC BOTTLES GRAM POSITIVE COCCI Organism ID to follow CRITICAL RESULT CALLED TO, READ BACK BY AND VERIFIED WITH: CHARLES SHANLEVER @1636  06/06/20 MJU LISA KLUTTZ AT 1019 ON 06/07/2020 [place]. Performed at [facility], [place] [address] [place],  [address]    Culture ENTEROCOCCUS FAECALIS (A)  Final   Report Status 06/08/2020 FINAL  Final   Organism ID, Bacteria ENTEROCOCCUS FAECALIS  Final      Susceptibility   Enterococcus faecalis - MIC*    AMPICILLIN <=2 SENSITIVE Sensitive     VANCOMYCIN 1 SENSITIVE Sensitive     GENTAMICIN SYNERGY SENSITIVE Sensitive     * ENTEROCOCCUS FAECALIS  Blood Culture ID Panel (  Reflexed)     Status: Abnormal   Collection Time: 06/04/20  8:49 PM  Result Value Ref Range Status   Enterococcus faecalis DETECTED (A) NOT DETECTED Final    Comment: CRITICAL RESULT CALLED TO, READ BACK BY AND VERIFIED WITH: CHARLES SHANLEVER @1636  06/06/20 MJU    Enterococcus Faecium NOT DETECTED NOT DETECTED Final   Listeria monocytogenes NOT DETECTED NOT DETECTED Final   Staphylococcus species NOT DETECTED NOT DETECTED Final   Staphylococcus aureus (BCID) NOT DETECTED NOT DETECTED Final   Staphylococcus epidermidis NOT DETECTED NOT DETECTED Final   Staphylococcus lugdunensis NOT DETECTED NOT DETECTED Final   Streptococcus species NOT DETECTED NOT DETECTED Final   Streptococcus agalactiae NOT DETECTED NOT DETECTED Final   Streptococcus pneumoniae NOT DETECTED NOT DETECTED Final   Streptococcus pyogenes NOT DETECTED NOT DETECTED Final   A.calcoaceticus-baumannii NOT DETECTED NOT DETECTED Final   Bacteroides fragilis NOT DETECTED NOT DETECTED Final   Enterobacterales NOT DETECTED NOT DETECTED Final    Enterobacter cloacae complex NOT DETECTED NOT DETECTED Final   Escherichia coli NOT DETECTED NOT DETECTED Final   Klebsiella aerogenes NOT DETECTED NOT DETECTED Final   Klebsiella oxytoca NOT DETECTED NOT DETECTED Final   Klebsiella pneumoniae NOT DETECTED NOT DETECTED Final   Proteus species NOT DETECTED NOT DETECTED Final   Salmonella species NOT DETECTED NOT DETECTED Final   Serratia marcescens NOT DETECTED NOT DETECTED Final   Haemophilus influenzae NOT DETECTED NOT DETECTED Final   Neisseria meningitidis NOT DETECTED NOT DETECTED Final   Pseudomonas aeruginosa NOT DETECTED NOT DETECTED Final   Stenotrophomonas maltophilia NOT DETECTED NOT DETECTED Final   Candida albicans NOT DETECTED NOT DETECTED Final   Candida auris NOT DETECTED NOT DETECTED Final   Candida glabrata NOT DETECTED NOT DETECTED Final   Candida krusei NOT DETECTED NOT DETECTED Final   Candida parapsilosis NOT DETECTED NOT DETECTED Final   Candida tropicalis NOT DETECTED NOT DETECTED Final   Cryptococcus neoformans/gattii NOT DETECTED NOT DETECTED Final   Vancomycin resistance NOT DETECTED NOT DETECTED Final    Comment: Performed at [facility], [address] [place], [place] [address]  Culture, blood (Routine X 2) w Reflex to ID Panel     Status: Abnormal   Collection Time: 06/05/20  1:25 PM   Specimen: BLOOD  Result Value Ref Range Status   Specimen Description   Final    BLOOD LAC Performed at [facility], [address] [place], [place] [address]    Special Requests   Final    BOTTLES DRAWN AEROBIC AND ANAEROBIC Blood Culture adequate volume Performed at [facility], [address] [place], [place] [address]    Culture  Setup Time   Final    Organism ID to follow ANAEROBIC BOTTLE ONLY CRITICAL RESULT CALLED TO, READ BACK BY AND VERIFIED WITH: JASON ROBBINS 06/07/20 AT 1506 BY ACR GRAM POSITIVE COCCI PREVIOUSLY REPORTED AS: GRAM NEGATIVE COCCOBACILLI CORRECTED RESULTS  CALLED TO: D,Ziegler PHARMD, at [place] 06/07/20 BY E. BENTON    Culture (A)  Final    ENTEROCOCCUS FAECALIS SUSCEPTIBILITIES PERFORMED ON PREVIOUS CULTURE WITHIN THE LAST 5 DAYS. Performed at [facility], [place] [address], [place], [place] [address]    Report Status 06/08/2020 FINAL  Final  Blood Culture ID Panel (Reflexed)     Status: Abnormal   Collection Time: 06/05/20  1:25 PM  Result Value Ref Range Status   Enterococcus faecalis DETECTED (A) NOT DETECTED Final    Comment: CRITICAL RESULT CALLED  TO, READ BACK BY AND VERIFIED WITH: JASON ROBBINS 06/07/20 AT 1506 BY ACR    Enterococcus Faecium NOT DETECTED NOT DETECTED Final   Listeria monocytogenes NOT DETECTED NOT DETECTED Final   Staphylococcus species NOT DETECTED NOT DETECTED Final   Staphylococcus aureus (BCID) NOT DETECTED NOT DETECTED Final   Staphylococcus epidermidis NOT DETECTED NOT DETECTED Final   Staphylococcus lugdunensis NOT DETECTED NOT DETECTED Final   Streptococcus species NOT DETECTED NOT DETECTED Final   Streptococcus agalactiae NOT DETECTED NOT DETECTED Final   Streptococcus pneumoniae NOT DETECTED NOT DETECTED Final   Streptococcus pyogenes NOT DETECTED NOT DETECTED Final   A.calcoaceticus-baumannii NOT DETECTED NOT DETECTED Final   Bacteroides fragilis NOT DETECTED NOT DETECTED Final   Enterobacterales NOT DETECTED NOT DETECTED Final   Enterobacter cloacae complex NOT DETECTED NOT DETECTED Final   Escherichia coli NOT DETECTED NOT DETECTED Final   Klebsiella aerogenes NOT DETECTED NOT DETECTED Final   Klebsiella oxytoca NOT DETECTED NOT DETECTED Final   Klebsiella pneumoniae NOT DETECTED NOT DETECTED Final   Proteus species NOT DETECTED NOT DETECTED Final   Salmonella species NOT DETECTED NOT DETECTED Final   Serratia marcescens NOT DETECTED NOT DETECTED Final   Haemophilus influenzae NOT DETECTED NOT DETECTED Final   Neisseria meningitidis NOT DETECTED NOT DETECTED Final   Pseudomonas aeruginosa NOT  DETECTED NOT DETECTED Final   Stenotrophomonas maltophilia NOT DETECTED NOT DETECTED Final   Candida albicans NOT DETECTED NOT DETECTED Final   Candida auris NOT DETECTED NOT DETECTED Final   Candida glabrata NOT DETECTED NOT DETECTED Final   Candida krusei NOT DETECTED NOT DETECTED Final   Candida parapsilosis NOT DETECTED NOT DETECTED Final   Candida tropicalis NOT DETECTED NOT DETECTED Final   Cryptococcus neoformans/gattii NOT DETECTED NOT DETECTED Final   Vancomycin resistance NOT DETECTED NOT DETECTED Final    Comment: Performed at [facility], [address] [place], [place] [address]  CULTURE, BLOOD (ROUTINE X 2) w Reflex to ID Panel     Status: None (Preliminary result)   Collection Time: 06/07/20  7:02 PM   Specimen: BLOOD  Result Value Ref Range Status   Specimen Description BLOOD BLOOD LEFT FOREARM  Final   Special Requests   Final    BOTTLES DRAWN AEROBIC AND ANAEROBIC Blood Culture results may not be optimal due to an inadequate volume of blood received in culture bottles   Culture   Final    NO GROWTH 3 DAYS Performed at [facility], [address] [place], [place] [address]    Report Status PENDING  Incomplete  CULTURE, BLOOD (ROUTINE X 2) w Reflex to ID Panel     Status: None (Preliminary result)   Collection Time: 06/07/20  8:46 PM   Specimen: BLOOD  Result Value Ref Range Status   Specimen Description BLOOD BLOOD LEFT HAND  Final   Special Requests   Final    BOTTLES DRAWN AEROBIC ONLY Blood Culture results may not be optimal due to an inadequate volume of blood received in culture bottles   Culture   Final    NO GROWTH 3 DAYS Performed at [facility], [address] [place], [place] [address]    Report Status PENDING  Incomplete    Coagulation Studies: No results for input(s): LABPROT, INR in the last 72 hours.  Urinalysis: No results for input(s): COLORURINE, LABSPEC, PHURINE, GLUCOSEU, HGBUR, BILIRUBINUR, KETONESUR,  PROTEINUR, UROBILINOGEN, NITRITE, LEUKOCYTESUR in the last 72 hours.  Invalid input(s): APPERANCEUR  Imaging: [place] UPPER EXTREMITY ARTERIAL RIGHT LIMITED (GRAFT, SINGLE VESSEL)  Result Date: 06/09/2020 CLINICAL DATA:  59 year old female with new swelling at the dialysis access. EXAM: RIGHT UPPER EXTREMITY ARTERIAL DUPLEX SCAN TECHNIQUE: Gray-scale sonography as well as color Doppler and duplex ultrasound was performed to evaluate a limited evaluation of the upper extremity. COMPARISON:  None. FINDINGS: Duplex performed in the region clinical concern. Flow confirmed within the dialysis circuit. No volume measurement or velocities were measured. Heterogeneous fluid present adjacent to the graft within the soft tissues. No internal color flow to suggest a patent pseudoaneurysm. Estimated dimension of the fluid 3.3 cm x 2.5 cm x 3.5 cm. IMPRESSION: Heterogeneous fluid collection adjacent to patent right upper extremity dialysis graft, with the differential including blood products/hematoma versus abscess given the history. Electronically Signed   By: Corrie Mckusick D.O.   On: 06/09/2020 10:23     Medications:    ampicillin (OMNIPEN) IV 2 g (06/10/20 1000)   [START ON 06/11/2020] gentamicin      aspirin EC  81 mg Oral Daily   atorvastatin  20 mg Oral Daily   carvedilol  6.25 mg Oral BID WC   Chlorhexidine Gluconate Cloth  6 each Topical Daily   cholestyramine light  4 g Oral BID   epoetin (EPOGEN/PROCRIT) injection  10,000 Units Intravenous Q M,W,F-HD   ferrous sulfate  325 mg Oral Daily   furosemide  80 mg Oral Daily   gabapentin  100 mg Oral QPM   heparin  5,000 Units Subcutaneous Q8H   hydrOXYzine  25 mg Oral Daily   lipase/protease/amylase  12,000 Units Oral TID WC   multivitamin  1 tablet Oral Daily   sevelamer carbonate  800 mg Oral TID WC   cyanocobalamin  1,000 mcg Oral Daily   acetaminophen **OR** acetaminophen, calcium carbonate, camphor-menthol **AND**  hydrOXYzine, feeding supplement (NEPRO CARB STEADY), hydrALAZINE, ipratropium-albuterol, loperamide, morphine injection, naLOXone (NARCAN)  injection, ondansetron **OR** ondansetron (ZOFRAN) IV, oxyCODONE-acetaminophen, promethazine, sorbitol, zolpidem  Assessment/ Plan:  Ms. TELIAH BUFFALO is a 59 y.o.  female with past medical history of ESRD on HD MWF, hypertension, COPD, diastolic heart failure, depression, diabetes mellitus type 2, hyperlipidemia, anemia of chronic kidney disease, secondary hyperparathyroidism, new dialysis access placement 04/12/2020 who presented with missed dialysis treatment and volume overload.  [facility]/MWF3/[place]/81kg.  1. ESRD on HD MWF/volume overload/pulmonary edema.  - Patient had right femoral temp dialysis catheter placed yesterday - Will plan for dialysis later today using temp catheter   2. Anemia of chronic kidney disease. Continue epogen 10000 units IV with HD.  3. Secondary hyperparathyroidism. Continue renvela 1 tab PO TID with meals   4. Enterococcal bacetermia: On ampicillin and gentamicin.  - source may be from HERO graft - plan per vascular  - pharmacy recommending 80mg  of gentamicin after treatment today.    LOS: [place] 9/19/202112:30 PM  Patient was seen and examined with [facility]. Vascular to address patient's infected AVG. Above plan was discussed and agreed upon on the signing of this note.   Lavonia Dana, MD [facility]  9/19/20212:12 PM

## 2020-06-10 NOTE — Progress Notes (Signed)
Progress Note    [name]  LNL:[id number] DOB: December 01, 1960  DOA: 06/02/2020 PCP: [facility] [place]      Brief Narrative:    Medical records reviewed and are as summarized below:  [name] is a 59 y.o. female with past medical history for end-stage renal disease on hemodialysis, hypertension, diabetes mellitus, CAD, COPD, PVD admitted on 9/11 after coming in with complaints of chills, vomiting, shortness of breath and chest pain and found to be hypoxic.  Patient found to have new onset pulmonary edema, due to volume overload as well as COPD exacerbation.  Had recently missed dialysis.  Procalcitonin also elevated and patient thought to also have possible pneumonia.  Patient admitted to the hospitalist service and nephrology consulted.  Patient has continued receiving dialysis.  VQ scan done ruled out PE.     Assessment/Plan:   Principal Problem:   Bacteremia due to Enterococcus Active Problems:   Acute respiratory failure with hypoxia (HCC)   Anemia of chronic disease   Acute diastolic CHF (congestive heart failure) (HCC)   COPD (chronic obstructive pulmonary disease) (HCC)   ESRD on hemodialysis (HCC)   Benign essential HTN   Obesity (BMI 30-39.9)  Body mass index is 31.7 kg/m.  (Obesity)  Abscess over right arm AV graft   PLAN   Plan for abscess drainage and graft explant sometime this week.  Follow-up with vascular surgeon for further recommendations  Continue IV ampicillin and gentamicin  Continue antihypertensives  Continue bronchodilators for COPD  Cardiologist has been consulted for TEE.  Follow-up with nephrologist for hemodialysis   Diet Order            Diet renal with fluid restriction Fluid restriction: 1200 mL Fluid; Room service appropriate? Yes; Fluid consistency: Thin  Diet effective now                    Consultants:  Nephrologist  ID specialist  Cardiologist     Medications:   . aspirin EC   81 mg Oral Daily  . atorvastatin  20 mg Oral Daily  . carvedilol  6.25 mg Oral BID WC  . Chlorhexidine Gluconate Cloth  6 each Topical Daily  . cholestyramine light  4 g Oral BID  . epoetin (EPOGEN/PROCRIT) injection  10,000 Units Intravenous Q M,W,F-HD  . ferrous sulfate  325 mg Oral Daily  . furosemide  80 mg Oral Daily  . gabapentin  100 mg Oral QPM  . heparin  5,000 Units Subcutaneous Q8H  . hydrOXYzine  25 mg Oral Daily  . lipase/protease/amylase  12,000 Units Oral TID WC  . multivitamin  1 tablet Oral Daily  . sevelamer carbonate  800 mg Oral TID WC  . cyanocobalamin  1,000 mcg Oral Daily   Continuous Infusions: . ampicillin (OMNIPEN) IV 2 g (06/10/20 1000)  . [START ON 06/11/2020] gentamicin       Anti-infectives (From admission, onward)   Start     Dose/Rate Route Frequency Ordered Stop   06/11/20 1200  gentamicin (GARAMYCIN) IVPB 80 mg        80 mg 100 mL/hr over 30 Minutes Intravenous Every M-W-F (Hemodialysis) 06/08/20 1356     06/08/20 1800  gentamicin (GARAMYCIN) IVPB 100 mg  Status:  Discontinued        100 mg 200 mL/hr over 30 Minutes Intravenous  Once 06/08/20 1356 06/08/20 1425   06/08/20 1800  gentamicin (GARAMYCIN) IVPB 100 mg  Status:  Discontinued  100 mg 200 mL/hr over 30 Minutes Intravenous  Once 06/08/20 1425 06/08/20 1430   06/08/20 1800  gentamicin (GARAMYCIN) IVPB 100 mg  Status:  Discontinued        100 mg 200 mL/hr over 30 Minutes Intravenous  Once 06/08/20 1430 06/08/20 1441   06/08/20 1800  gentamicin (GARAMYCIN) 100 mg in dextrose 5 % 50 mL IVPB        100 mg 105 mL/hr over 30 Minutes Intravenous  Once 06/08/20 1441 06/08/20 1927   06/08/20 1500  ampicillin (OMNIPEN) 2 g in sodium chloride 0.9 % 100 mL IVPB        2 g 300 mL/hr over 20 Minutes Intravenous Every 12 hours 06/08/20 1356     06/07/20 2200  piperacillin-tazobactam (ZOSYN) IVPB 2.25 g  Status:  Discontinued        2.25 g 100 mL/hr over 30 Minutes Intravenous Every 8 hours  06/07/20 1657 06/08/20 1010   06/07/20 1400  ampicillin (OMNIPEN) 2 g in sodium chloride 0.9 % 100 mL IVPB  Status:  Discontinued        2 g 300 mL/hr over 20 Minutes Intravenous Every 8 hours 06/07/20 1025 06/07/20 1637   06/06/20 1600  piperacillin-tazobactam (ZOSYN) IVPB 2.25 g  Status:  Discontinued        2.25 g 100 mL/hr over 30 Minutes Intravenous Every 8 hours 06/06/20 1403 06/07/20 1025   06/03/20 1400  azithromycin (ZITHROMAX) 500 mg in sodium chloride 0.9 % 250 mL IVPB  Status:  Discontinued        500 mg 250 mL/hr over 60 Minutes Intravenous Every 24 hours 06/03/20 1322 06/06/20 1319   06/03/20 1400  cefTRIAXone (ROCEPHIN) 1 g in sodium chloride 0.9 % 100 mL IVPB  Status:  Discontinued        1 g 200 mL/hr over 30 Minutes Intravenous Every 24 hours 06/03/20 1322 06/06/20 1319             Family Communication/Anticipated D/C date and plan/Code Status   DVT prophylaxis: heparin injection 5,000 Units Start: 06/02/20 2300     Code Status: Full Code  Family Communication: Plan discussed with patient Disposition Plan:    Status is: Inpatient  Remains inpatient appropriate because:IV treatments appropriate due to intensity of illness or inability to take PO   Dispo: The patient is from: Home              Anticipated d/c is to: Home              Anticipated d/c date is: 3 days              Patient currently is not medically stable to d/c.           Subjective:   She feels better today.  No pain at AV graft or abscess site.  Objective:    Vitals:   06/09/20 1559 06/09/20 2058 06/10/20 0448 06/10/20 0500  BP: (!) 163/52 (!) 171/55 (!) 150/52   Pulse: (!) 59 (!) 58 (!) 55   Resp: 16 15 15    Temp: 97.9 F (36.6 C) 98.5 F (36.9 C) 97.6 F (36.4 C)   TempSrc: Oral Oral Oral   SpO2: 97% 96% 98%   Weight:    86.4 kg  Height:       No data found.   Intake/Output Summary (Last 24 hours) at 06/10/2020 1143 Last data filed at 06/10/2020 0600 Gross  per 24 hour  Intake 562.52 ml  Output 0 ml  Net 562.52 ml   Filed Weights   06/05/20 0500 06/06/20 0800 06/10/20 0500  Weight: 82 kg 89.6 kg 86.4 kg    Exam:    GEN: NAD SKIN: No rash EYES: EOMI ENT: MMM CV: RRR PULM: CTA B ABD: soft, ND, NT, +BS CNS: AAO x 3, non focal EXT: Warm with mild tenderness on the right distal arm/elbow on medial aspect.  Mild lower extremity edema and tenderness.   Data Reviewed:   I have personally reviewed following labs and imaging studies:  Labs: Labs show the following:   Basic Metabolic Panel: Recent Labs  Lab 06/04/20 0658 06/04/20 0658 06/05/20 1325 06/06/20 0612 06/08/20 1722  NA 136  --  134* 131*  --   K 3.7   < > 3.8 3.7  --   CL 97*  --  93* 93*  --   CO2 30  --  27 27  --   GLUCOSE 98  --  268* 239*  --   BUN 17  --  15 18  --   CREATININE 4.80*  --  4.30* 5.03*  --   CALCIUM 7.8*  --  7.8* 7.8*  --   PHOS  --   --   --   --  1.9*   < > = values in this interval not displayed.   GFR Estimated Creatinine Clearance: 12.8 mL/min (A) (by C-G formula based on SCr of 5.03 mg/dL (H)). Liver Function Tests: No results for input(s): AST, ALT, ALKPHOS, BILITOT, PROT, ALBUMIN in the last 168 hours. No results for input(s): LIPASE, AMYLASE in the last 168 hours. No results for input(s): AMMONIA in the last 168 hours. Coagulation profile No results for input(s): INR, PROTIME in the last 168 hours.  CBC: Recent Labs  Lab 06/04/20 0658 06/05/20 1325 06/06/20 0612  WBC 3.3* 8.3 5.7  HGB 7.7* 8.2* 8.2*  HCT 23.8* 27.0* 25.2*  MCV 89.1 93.8 89.7  PLT 71* 77* 78*   Cardiac Enzymes: No results for input(s): CKTOTAL, CKMB, CKMBINDEX, TROPONINI in the last 168 hours. BNP (last 3 results) No results for input(s): PROBNP in the last 8760 hours. CBG: Recent Labs  Lab 06/09/20 1556 06/09/20 2054 06/10/20 0049 06/10/20 0445 06/10/20 0849  GLUCAP 100* 114* 122* 122* 113*   D-Dimer: No results for input(s): DDIMER in  the last 72 hours. Hgb A1c: No results for input(s): HGBA1C in the last 72 hours. Lipid Profile: No results for input(s): CHOL, HDL, LDLCALC, TRIG, CHOLHDL, LDLDIRECT in the last 72 hours. Thyroid function studies: No results for input(s): TSH, T4TOTAL, T3FREE, THYROIDAB in the last 72 hours.  Invalid input(s): FREET3 Anemia work up: No results for input(s): VITAMINB12, FOLATE, FERRITIN, TIBC, IRON, RETICCTPCT in the last 72 hours. Sepsis Labs: Recent Labs  Lab 06/03/20 1404 06/04/20 0658 06/05/20 1325 06/06/20 0612  PROCALCITON 20.16  --   --  22.77  WBC  --  3.3* 8.3 5.7    Microbiology Recent Results (from the past 240 hour(s))  SARS Coronavirus 2 by RT PCR (hospital order, performed in [facility] hospital lab) Nasopharyngeal Nasopharyngeal Swab     Status: None   Collection Time: 06/03/20  6:49 AM   Specimen: Nasopharyngeal Swab  Result Value Ref Range Status   SARS Coronavirus 2 NEGATIVE NEGATIVE Final    Comment: (NOTE) SARS-CoV-2 target nucleic acids are NOT DETECTED.  The SARS-CoV-2 RNA is generally detectable in upper and lower respiratory specimens during the acute phase of  infection. The lowest concentration of SARS-CoV-2 viral copies this assay can detect is 250 copies / mL. A negative result does not preclude SARS-CoV-2 infection and should not be used as the sole basis for treatment or other patient management decisions.  A negative result may occur with improper specimen collection / handling, submission of specimen other than nasopharyngeal swab, presence of viral mutation(s) within the areas targeted by this assay, and inadequate number of viral copies (<250 copies / mL). A negative result must be combined with clinical observations, patient history, and epidemiological information.  Fact Sheet for Patients:   StrictlyIdeas.no  Fact Sheet for Healthcare Providers: BankingDealers.co.za  This test is not  yet approved or  cleared by the [place] FDA and has been authorized for detection and/or diagnosis of SARS-CoV-2 by FDA under an Emergency Use Authorization (EUA).  This EUA will remain in effect (meaning this test can be used) for the duration of the COVID-19 declaration under Section 564(b)(1) of the Act, 21 U.S.C. section 360bbb-3(b)(1), unless the authorization is terminated or revoked sooner.  Performed at [facility], [address] [place], [place] [address]   Gastrointestinal Panel by PCR , Stool     Status: None   Collection Time: 06/04/20  7:39 PM   Specimen: Stool  Result Value Ref Range Status   Campylobacter species NOT DETECTED NOT DETECTED Final   Plesimonas shigelloides NOT DETECTED NOT DETECTED Final   Salmonella species NOT DETECTED NOT DETECTED Final   Yersinia enterocolitica NOT DETECTED NOT DETECTED Final   Vibrio species NOT DETECTED NOT DETECTED Final   Vibrio cholerae NOT DETECTED NOT DETECTED Final   Enteroaggregative E coli (EAEC) NOT DETECTED NOT DETECTED Final   Enteropathogenic E coli (EPEC) NOT DETECTED NOT DETECTED Final   Enterotoxigenic E coli (ETEC) NOT DETECTED NOT DETECTED Final   Shiga like toxin producing E coli (STEC) NOT DETECTED NOT DETECTED Final   Shigella/Enteroinvasive E coli (EIEC) NOT DETECTED NOT DETECTED Final   Cryptosporidium NOT DETECTED NOT DETECTED Final   Cyclospora cayetanensis NOT DETECTED NOT DETECTED Final   Entamoeba histolytica NOT DETECTED NOT DETECTED Final   Giardia lamblia NOT DETECTED NOT DETECTED Final   Adenovirus F40/41 NOT DETECTED NOT DETECTED Final   Astrovirus NOT DETECTED NOT DETECTED Final   Norovirus GI/GII NOT DETECTED NOT DETECTED Final   Rotavirus A NOT DETECTED NOT DETECTED Final   Sapovirus (I, II, IV, and V) NOT DETECTED NOT DETECTED Final    Comment: Performed at [facility], [address] [place], [place] [address]  Culture, blood (Routine X 2) w Reflex to ID Panel      Status: Abnormal   Collection Time: 06/04/20  8:49 PM   Specimen: BLOOD  Result Value Ref Range Status   Specimen Description   Final    BLOOD BLOOD LEFT FOREARM Performed at [facility], [address] [place], [place] [address]    Special Requests   Final    BOTTLES DRAWN AEROBIC AND ANAEROBIC Blood Culture adequate volume Performed at [facility], [address] [place], [place] [address]    Culture  Setup Time   Final    IN BOTH AEROBIC AND ANAEROBIC BOTTLES GRAM POSITIVE COCCI Organism ID to follow CRITICAL RESULT CALLED TO, READ BACK BY AND VERIFIED WITH: CHARLES SHANLEVER @1636  06/06/20 MJU LISA KLUTTZ AT 1019 ON 06/07/2020 [place]. Performed at [facility], [place] [address], [place], [place] [address]    Culture ENTEROCOCCUS FAECALIS (A)  Final   Report Status 06/08/2020 FINAL  Final   Organism ID, Bacteria ENTEROCOCCUS FAECALIS  Final      Susceptibility   Enterococcus faecalis - MIC*    AMPICILLIN <=2 SENSITIVE Sensitive     VANCOMYCIN 1 SENSITIVE Sensitive     GENTAMICIN SYNERGY SENSITIVE Sensitive     * ENTEROCOCCUS FAECALIS  Blood Culture ID Panel (Reflexed)     Status: Abnormal   Collection Time: 06/04/20  8:49 PM  Result Value Ref Range Status   Enterococcus faecalis DETECTED (A) NOT DETECTED Final    Comment: CRITICAL RESULT CALLED TO, READ BACK BY AND VERIFIED WITH: CHARLES SHANLEVER @1636  06/06/20 MJU    Enterococcus Faecium NOT DETECTED NOT DETECTED Final   Listeria monocytogenes NOT DETECTED NOT DETECTED Final   Staphylococcus species NOT DETECTED NOT DETECTED Final   Staphylococcus aureus (BCID) NOT DETECTED NOT DETECTED Final   Staphylococcus epidermidis NOT DETECTED NOT DETECTED Final   Staphylococcus lugdunensis NOT DETECTED NOT DETECTED Final   Streptococcus species NOT DETECTED NOT DETECTED Final   Streptococcus agalactiae NOT DETECTED NOT DETECTED Final   Streptococcus pneumoniae NOT DETECTED NOT DETECTED Final    Streptococcus pyogenes NOT DETECTED NOT DETECTED Final   A.calcoaceticus-baumannii NOT DETECTED NOT DETECTED Final   Bacteroides fragilis NOT DETECTED NOT DETECTED Final   Enterobacterales NOT DETECTED NOT DETECTED Final   Enterobacter cloacae complex NOT DETECTED NOT DETECTED Final   Escherichia coli NOT DETECTED NOT DETECTED Final   Klebsiella aerogenes NOT DETECTED NOT DETECTED Final   Klebsiella oxytoca NOT DETECTED NOT DETECTED Final   Klebsiella pneumoniae NOT DETECTED NOT DETECTED Final   Proteus species NOT DETECTED NOT DETECTED Final   Salmonella species NOT DETECTED NOT DETECTED Final   Serratia marcescens NOT DETECTED NOT DETECTED Final   Haemophilus influenzae NOT DETECTED NOT DETECTED Final   Neisseria meningitidis NOT DETECTED NOT DETECTED Final   Pseudomonas aeruginosa NOT DETECTED NOT DETECTED Final   Stenotrophomonas maltophilia NOT DETECTED NOT DETECTED Final   Candida albicans NOT DETECTED NOT DETECTED Final   Candida auris NOT DETECTED NOT DETECTED Final   Candida glabrata NOT DETECTED NOT DETECTED Final   Candida krusei NOT DETECTED NOT DETECTED Final   Candida parapsilosis NOT DETECTED NOT DETECTED Final   Candida tropicalis NOT DETECTED NOT DETECTED Final   Cryptococcus neoformans/gattii NOT DETECTED NOT DETECTED Final   Vancomycin resistance NOT DETECTED NOT DETECTED Final    Comment: Performed at [facility], [address] [place], [place] [address]  Culture, blood (Routine X 2) w Reflex to ID Panel     Status: Abnormal   Collection Time: 06/05/20  1:25 PM   Specimen: BLOOD  Result Value Ref Range Status   Specimen Description   Final    BLOOD LAC Performed at [facility], [address] [place], [place] [address]    Special Requests   Final    BOTTLES DRAWN AEROBIC AND ANAEROBIC Blood Culture adequate volume Performed at [facility], [address] [place], [place] [address]    Culture  Setup Time   Final    Organism  ID to follow ANAEROBIC BOTTLE ONLY CRITICAL RESULT CALLED TO, READ BACK BY AND VERIFIED WITH: JASON ROBBINS 06/07/20 AT 1506 BY ACR GRAM POSITIVE COCCI PREVIOUSLY REPORTED AS: GRAM NEGATIVE COCCOBACILLI CORRECTED RESULTS CALLED TO: D,Ziegler PHARMD, at [place] 06/07/20 BY E. BENTON    Culture (A)  Final    ENTEROCOCCUS FAECALIS SUSCEPTIBILITIES PERFORMED ON PREVIOUS CULTURE WITHIN THE LAST 5  DAYS. Performed at [facility], [place] [address], [place], [place] [address]    Report Status 06/08/2020 FINAL  Final  Blood Culture ID Panel (Reflexed)     Status: Abnormal   Collection Time: 06/05/20  1:25 PM  Result Value Ref Range Status   Enterococcus faecalis DETECTED (A) NOT DETECTED Final    Comment: CRITICAL RESULT CALLED TO, READ BACK BY AND VERIFIED WITH: JASON ROBBINS 06/07/20 AT 1506 BY ACR    Enterococcus Faecium NOT DETECTED NOT DETECTED Final   Listeria monocytogenes NOT DETECTED NOT DETECTED Final   Staphylococcus species NOT DETECTED NOT DETECTED Final   Staphylococcus aureus (BCID) NOT DETECTED NOT DETECTED Final   Staphylococcus epidermidis NOT DETECTED NOT DETECTED Final   Staphylococcus lugdunensis NOT DETECTED NOT DETECTED Final   Streptococcus species NOT DETECTED NOT DETECTED Final   Streptococcus agalactiae NOT DETECTED NOT DETECTED Final   Streptococcus pneumoniae NOT DETECTED NOT DETECTED Final   Streptococcus pyogenes NOT DETECTED NOT DETECTED Final   A.calcoaceticus-baumannii NOT DETECTED NOT DETECTED Final   Bacteroides fragilis NOT DETECTED NOT DETECTED Final   Enterobacterales NOT DETECTED NOT DETECTED Final   Enterobacter cloacae complex NOT DETECTED NOT DETECTED Final   Escherichia coli NOT DETECTED NOT DETECTED Final   Klebsiella aerogenes NOT DETECTED NOT DETECTED Final   Klebsiella oxytoca NOT DETECTED NOT DETECTED Final   Klebsiella pneumoniae NOT DETECTED NOT DETECTED Final   Proteus species NOT DETECTED NOT DETECTED Final   Salmonella species NOT  DETECTED NOT DETECTED Final   Serratia marcescens NOT DETECTED NOT DETECTED Final   Haemophilus influenzae NOT DETECTED NOT DETECTED Final   Neisseria meningitidis NOT DETECTED NOT DETECTED Final   Pseudomonas aeruginosa NOT DETECTED NOT DETECTED Final   Stenotrophomonas maltophilia NOT DETECTED NOT DETECTED Final   Candida albicans NOT DETECTED NOT DETECTED Final   Candida auris NOT DETECTED NOT DETECTED Final   Candida glabrata NOT DETECTED NOT DETECTED Final   Candida krusei NOT DETECTED NOT DETECTED Final   Candida parapsilosis NOT DETECTED NOT DETECTED Final   Candida tropicalis NOT DETECTED NOT DETECTED Final   Cryptococcus neoformans/gattii NOT DETECTED NOT DETECTED Final   Vancomycin resistance NOT DETECTED NOT DETECTED Final    Comment: Performed at [facility], [address] [place], [place] [address]  CULTURE, BLOOD (ROUTINE X 2) w Reflex to ID Panel     Status: None (Preliminary result)   Collection Time: 06/07/20  7:02 PM   Specimen: BLOOD  Result Value Ref Range Status   Specimen Description BLOOD BLOOD LEFT FOREARM  Final   Special Requests   Final    BOTTLES DRAWN AEROBIC AND ANAEROBIC Blood Culture results may not be optimal due to an inadequate volume of blood received in culture bottles   Culture   Final    NO GROWTH 3 DAYS Performed at [facility], [address] [place], [place] [address]    Report Status PENDING  Incomplete  CULTURE, BLOOD (ROUTINE X 2) w Reflex to ID Panel     Status: None (Preliminary result)   Collection Time: 06/07/20  8:46 PM   Specimen: BLOOD  Result Value Ref Range Status   Specimen Description BLOOD BLOOD LEFT HAND  Final   Special Requests   Final    BOTTLES DRAWN AEROBIC ONLY Blood Culture results may not be optimal due to an inadequate volume of blood received in culture bottles   Culture   Final    NO GROWTH 3 DAYS Performed  at [facility], [address] [place], [place] [address]    Report  Status PENDING  Incomplete    Procedures and diagnostic studies:  [place] UPPER EXTREMITY ARTERIAL RIGHT LIMITED (GRAFT, SINGLE VESSEL)  Result Date: 06/09/2020 CLINICAL DATA:  59 year old female with new swelling at the dialysis access. EXAM: RIGHT UPPER EXTREMITY ARTERIAL DUPLEX SCAN TECHNIQUE: Gray-scale sonography as well as color Doppler and duplex ultrasound was performed to evaluate a limited evaluation of the upper extremity. COMPARISON:  None. FINDINGS: Duplex performed in the region clinical concern. Flow confirmed within the dialysis circuit. No volume measurement or velocities were measured. Heterogeneous fluid present adjacent to the graft within the soft tissues. No internal color flow to suggest a patent pseudoaneurysm. Estimated dimension of the fluid 3.3 cm x 2.5 cm x 3.5 cm. IMPRESSION: Heterogeneous fluid collection adjacent to patent right upper extremity dialysis graft, with the differential including blood products/hematoma versus abscess given the history. Electronically Signed   By: Corrie Mckusick D.O.   On: 06/09/2020 10:23               LOS: 7 days   Tramain Gershman  [facility]   Pager on [email or web address]. If 7PM-7AM, please contact night-coverage at [email or web address]     06/10/2020, 11:43 AM

## 2020-06-10 NOTE — Progress Notes (Signed)
Asked to perform tee for bacteremia. Will place orders and keep npo after midnight. Will discuss with patient in am. Will need anesthesia for sedation.

## 2020-06-11 ENCOUNTER — Inpatient Hospital Stay
Admit: 2020-06-11 | Discharge: 2020-06-11 | Disposition: A | Discharge status: Home / Self Care | Payer: Medicare HMO | Attending: Cardiology | Admitting: Cardiology

## 2020-06-11 ENCOUNTER — Inpatient Hospital Stay: Payer: Medicare HMO | Admitting: Anesthesiology

## 2020-06-11 ENCOUNTER — Encounter
Admission: EM | Disposition: A | Discharge status: Skilled Nursing Facility | Payer: Self-pay | Source: Home / Self Care | Attending: Hospitalist

## 2020-06-11 ENCOUNTER — Inpatient Hospital Stay: Payer: Medicare HMO

## 2020-06-11 ENCOUNTER — Encounter: Payer: Self-pay | Admitting: Cardiology

## 2020-06-11 DIAGNOSIS — B952 Enterococcus as the cause of diseases classified elsewhere: Secondary | ICD-10-CM

## 2020-06-11 DIAGNOSIS — I5031 Acute diastolic (congestive) heart failure: Secondary | ICD-10-CM

## 2020-06-11 DIAGNOSIS — J9601 Acute respiratory failure with hypoxia: Secondary | ICD-10-CM

## 2020-06-11 DIAGNOSIS — R7881 Bacteremia: Secondary | ICD-10-CM

## 2020-06-11 HISTORY — PX: TEE WITHOUT CARDIOVERSION: SHX5443

## 2020-06-11 LAB — BASIC METABOLIC PANEL
Anion gap: 10 (ref 5–15)
BUN: 14 mg/dL (ref 8–23)
CO2: 26 mmol/L (ref 22–32)
Calcium: 7.9 mg/dL — ABNORMAL LOW (ref 8.9–10.3)
Chloride: 94 mmol/L — ABNORMAL LOW (ref 98–111)
Creatinine, Ser: 4.87 mg/dL — ABNORMAL HIGH (ref 0.44–1.00)
GFR calc Af Amer: 10 mL/min — ABNORMAL LOW (ref >60)
GFR calc non Af Amer: 9 mL/min — ABNORMAL LOW (ref >60)
Glucose, Bld: 115 mg/dL — ABNORMAL HIGH (ref 70–99)
Potassium: 3.3 mmol/L — ABNORMAL LOW (ref 3.5–5.1)
Sodium: 130 mmol/L — ABNORMAL LOW (ref 135–145)

## 2020-06-11 LAB — BASIC METABOLIC PANEL WITH GFR
Anion gap: 15 (ref 5–15)
BUN: 16 mg/dL (ref 8–23)
CO2: 23 mmol/L (ref 22–32)
Calcium: 8.3 mg/dL — ABNORMAL LOW (ref 8.9–10.3)
Chloride: 96 mmol/L — ABNORMAL LOW (ref 98–111)
Creatinine, Ser: 5.28 mg/dL — ABNORMAL HIGH (ref 0.44–1.00)
GFR calc Af Amer: 9 mL/min — ABNORMAL LOW
GFR calc non Af Amer: 8 mL/min — ABNORMAL LOW
Glucose, Bld: 121 mg/dL — ABNORMAL HIGH (ref 70–99)
Potassium: 4.2 mmol/L (ref 3.5–5.1)
Sodium: 134 mmol/L — ABNORMAL LOW (ref 135–145)

## 2020-06-11 LAB — GLUCOSE, CAPILLARY
Glucose-Capillary: 113 mg/dL — ABNORMAL HIGH (ref 70–99)
Glucose-Capillary: 120 mg/dL — ABNORMAL HIGH (ref 70–99)
Glucose-Capillary: 129 mg/dL — ABNORMAL HIGH (ref 70–99)
Glucose-Capillary: 138 mg/dL — ABNORMAL HIGH (ref 70–99)
Glucose-Capillary: 140 mg/dL — ABNORMAL HIGH (ref 70–99)
Glucose-Capillary: 87 mg/dL (ref 70–99)
Glucose-Capillary: 94 mg/dL (ref 70–99)
Glucose-Capillary: 96 mg/dL (ref 70–99)

## 2020-06-11 LAB — BLOOD GAS, ARTERIAL
Acid-Base Excess: 2.8 mmol/L — ABNORMAL HIGH (ref 0.0–2.0)
Bicarbonate: 24 mmol/L (ref 20.0–28.0)
FIO2: 0.24
MECHVT: 450 mL
O2 Saturation: 99.1 %
PEEP: 5 cmH2O
Patient temperature: 37
RATE: 18 resp/min
pCO2 arterial: 25 mmHg — ABNORMAL LOW (ref 32.0–48.0)
pH, Arterial: 7.59 — ABNORMAL HIGH (ref 7.350–7.450)
pO2, Arterial: 116 mmHg — ABNORMAL HIGH (ref 83.0–108.0)

## 2020-06-11 LAB — TRIGLYCERIDES: Triglycerides: 97 mg/dL (ref <150)

## 2020-06-11 LAB — MRSA PCR SCREENING: MRSA by PCR: NEGATIVE

## 2020-06-11 SURGERY — ECHOCARDIOGRAM, TRANSESOPHAGEAL
Anesthesia: General

## 2020-06-11 MED ORDER — SUCCINYLCHOLINE CHLORIDE 20 MG/ML IJ SOLN
INTRAMUSCULAR | Status: DC | PRN
  Administered 2020-06-11: 100 mg via INTRAVENOUS

## 2020-06-11 MED ORDER — PROPOFOL 1000 MG/100ML IV EMUL
5.0000 ug/kg/min | INTRAVENOUS | Status: DC
  Administered 2020-06-11: 25 ug/kg/min via INTRAVENOUS
  Administered 2020-06-12: 22 ug/kg/min via INTRAVENOUS
  Administered 2020-06-12: 40 ug/kg/min via INTRAVENOUS
  Administered 2020-06-12: 20 ug/kg/min via INTRAVENOUS
  Administered 2020-06-13: 5 ug/kg/min via INTRAVENOUS
  Administered 2020-06-14: 40 ug/kg/min via INTRAVENOUS
  Administered 2020-06-14: 15 ug/kg/min via INTRAVENOUS
  Administered 2020-06-14: 25 ug/kg/min via INTRAVENOUS
  Filled 2020-06-11 (×10): qty 100

## 2020-06-11 MED ORDER — LIDOCAINE VISCOUS HCL 2 % MT SOLN
OROMUCOSAL | Status: AC
  Filled 2020-06-11: qty 15

## 2020-06-11 MED ORDER — ATROPINE SULFATE 1 MG/10ML IJ SOSY
PREFILLED_SYRINGE | INTRAMUSCULAR | Status: AC
  Filled 2020-06-11: qty 10

## 2020-06-11 MED ORDER — BUTAMBEN-TETRACAINE-BENZOCAINE 2-2-14 % EX AERO
INHALATION_SPRAY | CUTANEOUS | Status: AC | PRN
  Administered 2020-06-11: 2 via TOPICAL

## 2020-06-11 MED ORDER — PROPOFOL 1000 MG/100ML IV EMUL
INTRAVENOUS | Status: AC
  Administered 2020-06-11: 5 ug/kg/min via INTRAVENOUS
  Filled 2020-06-11: qty 100

## 2020-06-11 MED ORDER — EPINEPHRINE 1 MG/10ML IJ SOSY: PREFILLED_SYRINGE | INTRAMUSCULAR | Status: DC | PRN

## 2020-06-11 MED ORDER — EPINEPHRINE 1 MG/10ML IJ SOSY
PREFILLED_SYRINGE | INTRAMUSCULAR | Status: DC | PRN
  Administered 2020-06-11: .05 mg via INTRAVENOUS

## 2020-06-11 MED ORDER — BUTAMBEN-TETRACAINE-BENZOCAINE 2-2-14 % EX AERO
INHALATION_SPRAY | CUTANEOUS | Status: AC
  Filled 2020-06-11: qty 5

## 2020-06-11 MED ORDER — FAMOTIDINE IN NACL 20-0.9 MG/50ML-% IV SOLN
20.0000 mg | Freq: Two times a day (BID) | INTRAVENOUS | Status: DC
  Administered 2020-06-12 (×2): 20 mg via INTRAVENOUS
  Filled 2020-06-11 (×2): qty 50

## 2020-06-11 MED ORDER — PROPOFOL 500 MG/50ML IV EMUL
INTRAVENOUS | Status: AC
  Filled 2020-06-11: qty 50

## 2020-06-11 MED ORDER — PHENYLEPHRINE HCL (PRESSORS) 10 MG/ML IV SOLN
INTRAVENOUS | Status: DC | PRN
  Administered 2020-06-11: 100 ug via INTRAVENOUS

## 2020-06-11 MED ORDER — ATROPINE SULFATE 1 MG/ML IJ SOLN
INTRAMUSCULAR | Status: DC | PRN
  Administered 2020-06-11 (×3): .5 mg via INTRAVENOUS

## 2020-06-11 MED ORDER — PROPOFOL 10 MG/ML IV BOLUS
INTRAVENOUS | Status: DC | PRN
  Administered 2020-06-11: 50 mg via INTRAVENOUS
  Administered 2020-06-11: 30 mg via INTRAVENOUS

## 2020-06-11 MED ORDER — SODIUM CHLORIDE 0.9 % IV SOLN: INTRAVENOUS | Status: DC

## 2020-06-11 MED ORDER — SODIUM CHLORIDE FLUSH 0.9 % IV SOLN
INTRAVENOUS | Status: AC
  Filled 2020-06-11: qty 10

## 2020-06-11 NOTE — Consult Note (Signed)
Cardiology Consultation Note    Patient ID: [name], MRN: [id number], DOB/AGE: Apr 24, 1961 59 y.o. Admit date: 06/02/2020   Date of Consult: 06/11/2020 Primary Physician: [facility], [facility] Primary Cardiologist: Nehemiah Massed  Chief Complaint: fefer Reason for Consultation: bacteremia Requesting MD: Dr. Mal Misty  HPI: [name] is a 59 y.o. female with history of end-stage renal disease on hemodialysis, hypertension, diabetes, coronary disease, chronic obstructive pulmonary disease as well as peripheral vascular disease who was admitted with fever chills shortness of breath and noted to be hypoxic.  She had recently missed hemodialysis.  Due to persistent positive blood cultures was felt to need transesophageal echo to evaluate for endocarditis.  Past Medical History:  Diagnosis Date  . Anemia   . Anginal pain ([place])   . Anxiety   . Arthritis   . Asthma   . Broken wrist   . Bronchitis   . chronic diastolic CHF 01/03/2439  . Chronic kidney disease   . COPD (chronic obstructive pulmonary disease) ([place])   . Coronary artery disease    a. cath 2013: stenting to RCA (report not available); b. cath 2014: LM nl, pLAD 40%, mLAD nl, ost LCx 40%, mid LCx nl, pRCA 30% @ site of prior stent, mRCA 50%  . Depression   . Diabetes mellitus ([place])   . Diabetes mellitus without complication ([place])   . Diabetic neuropathy ([place])   . dialysis 2006  . Diverticulosis   . Dizziness   . Dyspnea   . Elevated lipids   . Environmental and seasonal allergies   . ESRD (end stage renal disease) on dialysis ([place])    M-W-F  . Gastroparesis   . GERD (gastroesophageal reflux disease)   . Headache   . History of anemia due to chronic kidney disease   . History of hiatal hernia   . HOH (hard of hearing)   . Hx of pancreatitis 2015  . Hypertension   . Lower extremity edema   . Mitral regurgitation    a. echo 10/2013: EF 62%, noWMA, mildly dilated LA, mild to mod MR/TR, GR1DD  . Myocardial  infarction ([place])   . Orthopnea   . Parathyroid abnormality ([place])   . Peripheral arterial disease ([place])   . Pneumonia   . Renal cancer ([place])   . Renal insufficiency    Pt is on dialysis on M,W + F.  . Wheezing       Surgical History:  Past Surgical History:  Procedure Laterality Date  . A/V SHUNTOGRAM Left 01/20/2018   Procedure: A/V SHUNTOGRAM;  Surgeon: Algernon Huxley, MD;  Location: [place] [facility];  Service: Cardiovascular;  Laterality: Left;  . ABDOMINAL HYSTERECTOMY  1992  . AMPUTATION TOE Left 10/02/2017   Procedure: AMPUTATION TOE-LEFT GREAT TOE;  Surgeon: Albertine Patricia, DPM;  Location: [facility];  Service: Podiatry;  Laterality: Left;  . APPENDECTOMY    . APPLICATION OF WOUND VAC N/A 11/25/2019   Procedure: APPLICATION OF WOUND VAC;  Surgeon: Katha Cabal, MD;  Location: [facility];  Service: Vascular;  Laterality: N/A;  . ARTERY BIOPSY Right 10/11/2018   Procedure: BIOPSY TEMPORAL ARTERY;  Surgeon: Vickie Epley, MD;  Location: [facility];  Service: General;  Laterality: Right;  . CARDIAC CATHETERIZATION Left 07/26/2015   Procedure: Left Heart Cath and Coronary Angiography;  Surgeon: Dionisio David, MD;  Location: [place] [facility];  Service: Cardiovascular;  Laterality: Left;  . CATARACT EXTRACTION W/ INTRAOCULAR LENS IMPLANT Right   .  CATARACT EXTRACTION W/PHACO Left 03/10/2017   Procedure: CATARACT EXTRACTION PHACO AND INTRAOCULAR LENS PLACEMENT (IOC);  Surgeon: Birder Robson, MD;  Location: [facility];  Service: Ophthalmology;  Laterality: Left;  [place] 00:51.9 AP% 14.2 CDE 7.39 fluid pack lot # [id number]  . CENTRAL LINE INSERTION Right 11/11/2019   Procedure: CENTRAL LINE INSERTION;  Surgeon: Katha Cabal, MD;  Location: [facility];  Service: Vascular;  Laterality: Right;  . CENTRAL LINE INSERTION  11/25/2019   Procedure: CENTRAL LINE INSERTION;  Surgeon: Katha Cabal, MD;  Location: [facility];  Service: Vascular;;  . CHOLECYSTECTOMY    . COLONOSCOPY WITH  PROPOFOL N/A 08/12/2016   Procedure: COLONOSCOPY WITH PROPOFOL;  Surgeon: Lollie Sails, MD;  Location: [facility];  Service: Endoscopy;  Laterality: N/A;  . DIALYSIS FISTULA CREATION Left    upper arm  . dialysis grafts    . DIALYSIS/PERMA CATHETER INSERTION N/A 11/14/2019   Procedure: DIALYSIS/PERMA CATHETER INSERTION;  Surgeon: Algernon Huxley, MD;  Location: [place] [facility];  Service: Cardiovascular;  Laterality: N/A;  . DIALYSIS/PERMA CATHETER INSERTION N/A 02/03/2020   Procedure: DIALYSIS/PERMA CATHETER INSERTION;  Surgeon: Katha Cabal, MD;  Location: [place] [facility];  Service: Cardiovascular;  Laterality: N/A;  . DIALYSIS/PERMA CATHETER REMOVAL N/A 05/25/2020   Procedure: DIALYSIS/PERMA CATHETER REMOVAL;  Surgeon: Katha Cabal, MD;  Location: [place] [facility];  Service: Cardiovascular;  Laterality: N/A;  . ESOPHAGOGASTRODUODENOSCOPY N/A 03/08/2015   Procedure: ESOPHAGOGASTRODUODENOSCOPY (EGD);  Surgeon: Manya Silvas, MD;  Location: [facility];  Service: Endoscopy;  Laterality: N/A;  . ESOPHAGOGASTRODUODENOSCOPY (EGD) WITH PROPOFOL N/A 03/18/2016   Procedure: ESOPHAGOGASTRODUODENOSCOPY (EGD) WITH PROPOFOL;  Surgeon: [name], MD;  Location: [facility];  Service: Endoscopy;  Laterality: N/A;  . EYE SURGERY Right 2018  . FECAL TRANSPLANT N/A 08/23/2015   Procedure: FECAL TRANSPLANT;  Surgeon: Manya Silvas, MD;  Location: [facility] ENDOSCOPY;  Service: Endoscopy;  Laterality: N/A;  . HAND SURGERY Bilateral   . HEMATOMA EVACUATION Left 11/25/2019   Procedure: EVACUATION HEMATOMA;  Surgeon: Katha Cabal, MD;  Location: [facility];  Service: Vascular;  Laterality: Left;  . I & D EXTREMITY Left 11/25/2019   Procedure: IRRIGATION AND DEBRIDEMENT EXTREMITY;  Surgeon: Katha Cabal, MD;  Location: [facility];  Service: Vascular;  Laterality: Left;  . IR FLUORO GUIDE CV LINE RIGHT  04/06/2020  . IR RADIOLOGIST EVAL & MGMT  07/28/2019  . IR RADIOLOGIST EVAL  & MGMT  08/11/2019  . LIGATION OF ARTERIOVENOUS  FISTULA Left 11/11/2019  . LIGATION OF ARTERIOVENOUS  FISTULA Left 11/11/2019   Procedure: LIGATION OF ARTERIOVENOUS  FISTULA;  Surgeon: Katha Cabal, MD;  Location: [facility];  Service: Vascular;  Laterality: Left;  . PERIPHERAL VASCULAR CATHETERIZATION N/A 12/20/2015   Procedure: Thrombectomy of dialysis access versus permcath placement;  Surgeon: Algernon Huxley, MD;  Location: [place] [facility];  Service: Cardiovascular;  Laterality: N/A;  . PERIPHERAL VASCULAR CATHETERIZATION N/A 12/20/2015   Procedure: A/V Shunt Intervention;  Surgeon: Algernon Huxley, MD;  Location: [place] [facility];  Service: Cardiovascular;  Laterality: N/A;  . PERIPHERAL VASCULAR CATHETERIZATION N/A 12/20/2015   Procedure: A/V Shuntogram/Fistulagram;  Surgeon: Algernon Huxley, MD;  Location: [place] [facility];  Service: Cardiovascular;  Laterality: N/A;  . PERIPHERAL VASCULAR CATHETERIZATION N/A 01/02/2016   Procedure: A/V Shuntogram/Fistulagram;  Surgeon: Algernon Huxley, MD;  Location: [place] [facility];  Service: Cardiovascular;  Laterality: N/A;  . PERIPHERAL VASCULAR CATHETERIZATION N/A 01/02/2016  Procedure: A/V Shunt Intervention;  Surgeon: Algernon Huxley, MD;  Location: [place] [facility];  Service: Cardiovascular;  Laterality: N/A;  . UPPER EXTREMITY VENOGRAPHY Right 01/18/2020   Procedure: UPPER EXTREMITY VENOGRAPHY;  Surgeon: Katha Cabal, MD;  Location: [place] [facility];  Service: Cardiovascular;  Laterality: Right;  Marland Kitchen VASCULAR ACCESS DEVICE INSERTION Right 04/13/2020   Procedure: INSERTION OF HERO VASCULAR ACCESS DEVICE (GRAFT);  Surgeon: Katha Cabal, MD;  Location: [facility];  Service: Vascular;  Laterality: Right;     Home Meds: Prior to Admission medications   Medication Sig Start Date End Date Taking? Authorizing Provider  albuterol (PROVENTIL) (2.5 MG/3ML) 0.083% nebulizer solution Take 2.5 mg by nebulization every 4 (four) hours as needed  for wheezing or shortness of breath.   Yes [provider]  albuterol (VENTOLIN HFA) 108 (90 Base) MCG/ACT inhaler Inhale 2 puffs into the lungs every 4 (four) hours as needed for wheezing or shortness of breath. 11/09/19  Yes [provider]  ammonium lactate (LAC-HYDRIN) 12 % lotion Apply 1 application topically 2 (two) times daily as needed for dry skin.    Yes [provider]  Ascorbic Acid (VITA-C PO) Take 1 tablet by mouth daily.   Yes [provider]  aspirin EC 81 MG tablet Take 81 mg by mouth daily.   Yes [provider]  atorvastatin (LIPITOR) 20 MG tablet Take 20 mg by mouth every evening.  10/24/19  Yes [provider]  calcium acetate (PHOSLO) 667 MG capsule Take 667-2,001 mg by mouth See admin instructions. Take 3 capsules (2001mg ) by mouth three times a day with meals and take 1 capsule (667mg ) by mouth daily with a snack   Yes [provider]  Cholecalciferol (VITAMIN D3) 25 MCG (1000 UT) CAPS Take 1,000 Units by mouth daily.    Yes [provider]  cholestyramine light (PREVALITE) 4 g packet Take 4 g by mouth 2 (two) times daily.   Yes [provider]  CREON 12000 units CPEP capsule Take 12,000-36,000 Units by mouth See admin instructions. Take 3 capsules (36,000u) by mouth three times daily with meals and take 1 capsule (12,000u) by mouth daily with a snack 10/21/19  Yes [provider]  cyanocobalamin 1000 MCG tablet Take 1,000 mcg by mouth daily.    Yes [provider]  diphenhydrAMINE (BENADRYL) 25 MG tablet Take 25 mg by mouth every 6 (six) hours as needed for itching.   Yes [provider]  Ferrous Sulfate (IRON) 325 (65 Fe) MG TABS Take 325 mg by mouth daily.    Yes [provider]  furosemide (LASIX) 80 MG tablet Take 80 mg by mouth daily. 05/08/20  Yes [provider]  gabapentin (NEURONTIN) 100 MG capsule Take 100 mg by mouth every evening.    Yes [provider]  HYDROcodone-acetaminophen (NORCO/VICODIN) 5-325 MG tablet Take 1 tablet by mouth 4 (four) times daily as needed for moderate pain or severe pain.    Yes [provider]  hydrOXYzine (ATARAX/VISTARIL) 25 MG tablet Take 25 mg by mouth daily.    Yes [provider]  insulin aspart (NOVOLOG) 100 UNIT/ML injection Inject 4-15 Units into the skin in the morning, at noon, in the evening, and at bedtime. Sliding scale   Yes [provider]  insulin detemir (LEVEMIR) 100 UNIT/ML injection Inject 67 Units into the skin at bedtime.   Yes [provider]  multivitamin (RENA-VIT) TABS tablet Take 1 tablet by  mouth daily.    Yes [provider]  mupirocin ointment (BACTROBAN) 2 % Apply 1 application topically daily as needed (leg rash).  11/09/19  Yes [provider]  nitroGLYCERIN (NITROSTAT) 0.4 MG SL tablet Place 0.4 mg under the tongue every 5 (five) minutes as needed for chest pain.    Yes [provider]  Omega-3 300 MG CAPS Take 300 mg by mouth 2 (two) times daily. Every other day opposite omega XL   Yes [provider]  pantoprazole (PROTONIX) 40 MG tablet Take 40 mg by mouth daily. 08/28/19  Yes [provider]  Probiotic Product (PROBIOTIC PO) Take 1 capsule by mouth daily.   Yes [provider]  sevelamer carbonate (RENVELA) 800 MG tablet Take 800 mg by mouth 4 (four) times daily. (at meals and with a snack)   Yes [provider]  traMADol (ULTRAM) 50 MG tablet Take 1 tablet (50 mg total) by mouth every 8 (eight) hours as needed for moderate pain or severe pain. 05/21/20  Yes Schnier, Dolores Lory, MD  meclizine (ANTIVERT) 25 MG tablet Take 25 mg by mouth every 6 (six) hours.  11/01/19   [provider]    Inpatient Medications:  . aspirin EC  81 mg Oral Daily  . atorvastatin  20 mg Oral Daily  . carvedilol  6.25 mg Oral BID WC  . Chlorhexidine Gluconate Cloth  6 each Topical Daily  .  cholestyramine light  4 g Oral BID  . epoetin (EPOGEN/PROCRIT) injection  10,000 Units Intravenous Q M,W,F-HD  . ferrous sulfate  325 mg Oral Daily  . furosemide  80 mg Oral Daily  . gabapentin  100 mg Oral QPM  . heparin  5,000 Units Subcutaneous Q8H  . hydrOXYzine  25 mg Oral Daily  . lipase/protease/amylase  12,000 Units Oral TID WC  . multivitamin  1 tablet Oral Daily  . sevelamer carbonate  800 mg Oral TID WC  . cyanocobalamin  1,000 mcg Oral Daily   . sodium chloride    . ampicillin (OMNIPEN) IV 2 g (06/11/20 0109)  . gentamicin      Allergies:  Allergies  Allergen Reactions  . Ace Inhibitors Swelling and Anaphylaxis  . Ativan [Lorazepam] Other (See Comments)    Reaction:Hallucinations and headaches  . Compazine [Prochlorperazine Edisylate] Anaphylaxis, Nausea And Vomiting and Other (See Comments)    Other reaction(s): dystonia from this vs. Reglan, 23 Jul - patient relates that she takes promethazine frequently with no problems  . Sumatriptan Succinate Other (See Comments)    Other reaction(s): delirium and hallucinations per [facility] records  . Zofran [Ondansetron] Nausea And Vomiting    Per pt. she is allergic to zofran or will experience adverse reaction like hallucination   . [place] Nausea And Vomiting  . Losartan Nausea Only  . Prochlorperazine Other (See Comments)    Reaction:  Unknown . Patient does not remember reaction but she does have vertigo and anxiety along with n and v at times. Could be used to treat any of these   . Reglan [Metoclopramide] Other (See Comments)    Per patient her Dr. Evelina Bucy her off it   . Scopolamine Other (See Comments)    Dizziness, also has vertigo already  . Tape Rash    Plastic tape causes rash  . Tapentadol Rash    Social History   Socioeconomic History  . Marital status: Divorced    Spouse name: Not on file  . Number of children: Not  on file  . Years of education: Not on file  . Highest education level: Not on file   Occupational History  . Not on file  Tobacco Use  . Smoking status: Never Smoker  . Smokeless tobacco: Never Used  Vaping Use  . Vaping Use: Never used  Substance and Sexual Activity  . Alcohol use: Not Currently    Comment: glass wine week per pt  . Drug use: Yes    Types: Marijuana    Comment: once a day  . Sexual activity: Never  Other Topics Concern  . Not on file  Social History Narrative   ** Merged History Encounter **       Social Determinants of Health   Financial Resource Strain:   . Difficulty of Paying Living Expenses: Not on file  Food Insecurity:   . Worried About Charity fundraiser in the Last Year: Not on file  . Ran Out of Food in the Last Year: Not on file  Transportation Needs:   . Lack of Transportation (Medical): Not on file  . Lack of Transportation (Non-Medical): Not on file  Physical Activity:   . Days of Exercise per Week: Not on file  . Minutes of Exercise per Session: Not on file  Stress:   . Feeling of Stress : Not on file  Social Connections:   . Frequency of Communication with Friends and Family: Not on file  . Frequency of Social Gatherings with Friends and Family: Not on file  . Attends Religious Services: Not on file  . Active Member of Clubs or Organizations: Not on file  . Attends Archivist Meetings: Not on file  . Marital Status: Not on file  Intimate Partner Violence:   . Fear of Current or Ex-Partner: Not on file  . Emotionally Abused: Not on file  . Physically Abused: Not on file  . Sexually Abused: Not on file     Family History  Problem Relation Age of Onset  . Kidney disease Mother   . Diabetes Mother   . Cancer Father   . Kidney disease Sister      Review of Systems: A 12-system review of systems was performed and is negative except as noted in the HPI.  Labs: No results for input(s): CKTOTAL, CKMB, TROPONINI in the last 72 hours. Lab Results  Component Value Date   WBC 4.6 06/10/2020   HGB 8.4  (L) 06/10/2020   HCT 25.3 (L) 06/10/2020   MCV 86.6 06/10/2020   PLT 187 06/10/2020    Recent Labs  Lab 06/11/20 0648  NA 130*  K 3.3*  CL 94*  CO2 26  BUN 14  CREATININE 4.87*  CALCIUM 7.9*  GLUCOSE 115*   Lab Results  Component Value Date   CHOL 106 04/22/2016   HDL 72 04/22/2016   LDLCALC 22 04/22/2016   TRIG 59 04/22/2016   No results found for: DDIMER  Radiology/Studies:  DG Chest 1 View  Result Date: 06/05/2020 CLINICAL DATA:  Respiratory distress, pulmonary embolism EXAM: CHEST  1 VIEW COMPARISON:  06/02/2020 FINDINGS: Lung volumes are small and there is bibasilar atelectasis. Superimposed interstitial pulmonary edema has resolved. Tiny bilateral pleural effusions have improved in the interval. No pneumothorax. Cardiac size within normal limits. Right internal jugular he row graft is seen with its tip overlying the right atrium. Vascular stents are seen within the central left upper extremity veins and visualized right upper extremity. Cardiac size within normal limits. IMPRESSION: Small lung  volumes with bibasilar atelectasis. Superimposed interstitial pulmonary edema has resolved. Tiny bilateral pleural effusions have improved in the interval. Electronically Signed   By: Fidela Salisbury MD   On: 06/05/2020 16:54   CT HEAD WO CONTRAST  Result Date: 06/05/2020 CLINICAL DATA:  59 year old female with hypotension, diaphoresis, altered mental status today. In stage renal disease, dialysis. EXAM: CT HEAD WITHOUT CONTRAST TECHNIQUE: Contiguous axial images were obtained from the base of the skull through the vertex without intravenous contrast. COMPARISON:  Head CT 05/02/2020.  Brain MRI 05/03/2019. FINDINGS: Brain: No midline shift, ventriculomegaly, mass effect, evidence of mass lesion, intracranial hemorrhage or evidence of cortically based acute infarction. Chronic lower pontine lacunar infarcts better demonstrated on MRI last year. Elsewhere gray-white matter differentiation is  within normal limits throughout the brain. Vascular: Calcified atherosclerosis at the skull base. No suspicious intracranial vascular hyperdensity. Skull: Stable visualized osseous structures. Scattered small lucent areas in the calvarium (series 3, image 62 at the left vertex) have mildly increased since 2017 and are probably related to metabolic bone disease. Sinuses/Orbits: Visualized paranasal sinuses and mastoids are stable and well pneumatized. Other: No acute orbit or scalp soft tissue finding. Some scalp vessel calcified atherosclerosis is noted. IMPRESSION: 1. No acute intracranial abnormality. 2. Chronic lacunar infarcts in the pons. 3. Several slowly enlarging small lucent areas in the calvarium since 2017 are probably related to metabolic bone disease in the setting of chronic ESRD. Electronically Signed   By: Genevie Ann M.D.   On: 06/05/2020 16:28   CT ANGIO CHEST PE W OR WO CONTRAST  Result Date: 05/22/2020 CLINICAL DATA:  Shortness of breath. EXAM: CT ANGIOGRAPHY CHEST WITH CONTRAST TECHNIQUE: Multidetector CT imaging of the chest was performed using the standard protocol during bolus administration of intravenous contrast. Multiplanar CT image reconstructions and MIPs were obtained to evaluate the vascular anatomy. CONTRAST:  36mL OMNIPAQUE IOHEXOL 350 MG/ML SOLN COMPARISON:  Feb 16, 2016. FINDINGS: Cardiovascular: Satisfactory opacification of the pulmonary arteries to the segmental level. No evidence of pulmonary embolism. Mild cardiomegaly is noted. Atherosclerosis of thoracic aorta is noted without aneurysm or dissection. Is seen involving the left subclavian and brachiocephalic vein. Its proximal portion appears to be occluded, resulting in collateral circulations the lead to reconstitution of its more distal portion in the brachiocephalic vein. No pericardial effusion. Mediastinum/Nodes: Thyroid gland and esophagus are unremarkable. Stable enlarged mediastinal and axillary lymph nodes are  noted which most likely are reactive or inflammatory in etiology. Lungs/Pleura: No pneumothorax is noted. Small bilateral pleural effusions are noted. Mild interstitial densities are noted in both lung bases concerning for pulmonary edema. Upper Abdomen: Bilateral renal atrophy is noted consistent with history of end-stage renal disease. Catheter is noted in the IVC, with distal tip just inferior to cavoatrial junction. Musculoskeletal: No chest wall abnormality. No acute or significant osseous findings. Review of the MIP images confirms the above findings. IMPRESSION: 1. No definite evidence of pulmonary embolus. 2. There is a occlusion of the proximal portion of stent graft involving the left subclavian and brachiocephalic veins. Collateral circulation is seen leading to reconstitution of its more distal portion. This most likely is a chronic finding. 3. Stable enlarged mediastinal and axillary lymph nodes are noted which most likely are reactive or inflammatory in etiology. 4. Small bilateral pleural effusions are noted. 5. Mild interstitial densities are noted in both lung bases concerning for pulmonary edema. 6. Bilateral renal atrophy is noted consistent with history of end-stage renal disease. 7. Aortic atherosclerosis. Aortic Atherosclerosis (  ICD10-I70.0). Electronically Signed   By: Marijo Conception M.D.   On: 05/22/2020 12:02   NM Pulmonary Perfusion  Result Date: 06/05/2020 CLINICAL DATA:  Left chest pain EXAM: NUCLEAR MEDICINE PERFUSION LUNG SCAN TECHNIQUE: Perfusion images were obtained in multiple projections after intravenous injection of radiopharmaceutical. Ventilation scans intentionally deferred if perfusion scan and chest x-ray adequate for interpretation during COVID 19 epidemic. RADIOPHARMACEUTICALS:  4.724 mCi Tc-79m MAA IV COMPARISON:  None. Findings are correlated with concurrently performed chest radiograph FINDINGS: There is no significant perfusion defect identified. IMPRESSION:  Negative for pulmonary embolism Electronically Signed   By: Fidela Salisbury MD   On: 06/05/2020 16:56   PERIPHERAL VASCULAR CATHETERIZATION  Result Date: 05/25/2020 See Op Note  [place] UPPER EXTREMITY ARTERIAL RIGHT LIMITED (GRAFT, SINGLE VESSEL)  Result Date: 06/09/2020 CLINICAL DATA:  59 year old female with new swelling at the dialysis access. EXAM: RIGHT UPPER EXTREMITY ARTERIAL DUPLEX SCAN TECHNIQUE: Gray-scale sonography as well as color Doppler and duplex ultrasound was performed to evaluate a limited evaluation of the upper extremity. COMPARISON:  None. FINDINGS: Duplex performed in the region clinical concern. Flow confirmed within the dialysis circuit. No volume measurement or velocities were measured. Heterogeneous fluid present adjacent to the graft within the soft tissues. No internal color flow to suggest a patent pseudoaneurysm. Estimated dimension of the fluid 3.3 cm x 2.5 cm x 3.5 cm. IMPRESSION: Heterogeneous fluid collection adjacent to patent right upper extremity dialysis graft, with the differential including blood products/hematoma versus abscess given the history. Electronically Signed   By: Corrie Mckusick D.O.   On: 06/09/2020 10:23   DG Chest Port 1 View  Result Date: 06/07/2020 CLINICAL DATA:  RIGHT-sided chest pain and rib pain for 5 days EXAM: PORTABLE CHEST 1 VIEW COMPARISON:  Chest x-ray 06/05/2020 FINDINGS: RIGHT-sided dialysis catheter terminates in the upper RIGHT atrium, no change from the previous study. Stenting of LEFT axillary and subclavian venous system also unchanged. Heart size remains enlarged. Lung volumes mild bibasilar airspace disease. No acute skeletal process on limited assessment. IMPRESSION: 1. Low lung volumes with mild bibasilar airspace disease similar to prior. 2. Mild interstitial prominence may reflect mild pulmonary edema though is similar to the previous evaluation. Findings remain improved when compared to 06/02/2020. 3. No acute findings in the  chest. 4. Stable cardiomegaly. Electronically Signed   By: Zetta Bills M.D.   On: 06/07/2020 11:17   DG Chest Portable 1 View  Result Date: 06/02/2020 CLINICAL DATA:  Shortness of breath EXAM: PORTABLE CHEST 1 VIEW COMPARISON:  CT 05/22/2020, radiograph 05/22/2020 FINDINGS: Bilateral pleural thickening and small basilar effusions. Mixed interstitial and patchy opacities towards the mid to lower lungs likely reflecting a combination of interstitial and developing alveolar edema with central cuffing, vascular cephalization and congestion. Cardiomegaly is similar to priors. Evidence of prior bilateral vascular stenting in the left brachiocephalic/subclavian vein and right upper arm. A right upper extremity HeRO graft is also noted, terminating at the level the right atrium. Removal of a right lower extremity central venous catheter. Telemetry leads overlie the chest. Mild body wall edema. No other acute or suspicious soft tissue or osseous abnormality. Degenerative changes are present in the imaged spine and shoulders. IMPRESSION: Features of CHF/volume overload with cardiomegaly, edema and small bilateral effusions. More patchy opacities could reflect alveolar edema versus underlying infection. Electronically Signed   By: Lovena Le M.D.   On: 06/02/2020 18:37   DG Chest Portable 1 View  Result Date: 05/22/2020 CLINICAL DATA:  Severe  chest pain EXAM: PORTABLE CHEST 1 VIEW COMPARISON:  11/15/2019 FINDINGS: Diffuse interstitial opacity similar to prior. Chronic cardiomegaly. Probable trace pleural effusions. Dialysis catheters from above and below on the right. Left-sided venous stenting. IMPRESSION: Chronic/recurrent pulmonary edema and trace pleural effusions. No acute finding when compared to prior. Electronically Signed   By: Monte Fantasia M.D.   On: 05/22/2020 09:03   ECHOCARDIOGRAM COMPLETE  Result Date: 06/07/2020    ECHOCARDIOGRAM REPORT   Patient Name:   GERALDYN SHAIN Date of Exam: 06/07/2020  Medical Rec #:  [id number]   Height:       65.0 in Accession #:    [id number]  Weight:       197.5 lb Date of Birth:  1961/03/20    BSA:          1.968 m Patient Age:    36 years    BP:           133/52 mmHg Patient Gender: F           HR:           63 bpm. Exam Location:  [facility] Procedure: 2D Echo, Cardiac Doppler and Color Doppler Indications:     Bacteremia 790.7  History:         Patient has prior history of Echocardiogram examinations, most                  recent 02/24/2020. Previous Myocardial Infarction, COPD; Risk                  Factors:Diabetes. Mitral Regurgitation.  Sonographer:     Sherrie Sport RDCS (AE) Referring Phys:  [id number] Tsosie Billing Diagnosing Phys: Bartholome Bill MD  Sonographer Comments: Suboptimal apical window. IMPRESSIONS  1. Left ventricular ejection fraction, by estimation, is 60 to 65%. The left ventricle has normal function. The left ventricle has no regional wall motion abnormalities. There is mild left ventricular hypertrophy. Left ventricular diastolic parameters are consistent with Grade I diastolic dysfunction (impaired relaxation).  2. Right ventricular systolic function was not well visualized. The right ventricular size is mildly enlarged.  3. Right atrial size was mildly dilated.  4. The mitral valve is grossly normal. Trivial mitral valve regurgitation.  5. Tricuspid valve regurgitation is moderate.  6. The aortic valve is tricuspid. Aortic valve regurgitation is not visualized. FINDINGS  Left Ventricle: Left ventricular ejection fraction, by estimation, is 60 to 65%. The left ventricle has normal function. The left ventricle has no regional wall motion abnormalities. The left ventricular internal cavity size was normal in size. There is  mild left ventricular hypertrophy. Left ventricular diastolic parameters are consistent with Grade I diastolic dysfunction (impaired relaxation). Right Ventricle: The right ventricular size is mildly enlarged. Right vetricular wall  thickness was not well visualized. Right ventricular systolic function was not well visualized. Left Atrium: Left atrial size was normal in size. Right Atrium: Right atrial size was mildly dilated. Pericardium: There is no evidence of pericardial effusion. Mitral Valve: The mitral valve is grossly normal. Trivial mitral valve regurgitation. Tricuspid Valve: The tricuspid valve is grossly normal. Tricuspid valve regurgitation is moderate. Aortic Valve: The aortic valve is tricuspid. Aortic valve regurgitation is not visualized. Aortic valve mean gradient measures 5.0 mmHg. Aortic valve peak gradient measures 8.0 mmHg. Aortic valve area, by VTI measures 1.68 cm. Pulmonic Valve: The pulmonic valve was not well visualized. Pulmonic valve regurgitation is trivial. Aorta: The aortic root was not well visualized. IAS/Shunts: The interatrial septum  was not assessed.  LEFT VENTRICLE PLAX 2D LVIDd:         4.60 cm  Diastology LVIDs:         2.89 cm  LV e' medial:    6.20 cm/s LV PW:         0.95 cm  LV E/e' medial:  17.3 LV IVS:        0.98 cm  LV e' lateral:   9.03 cm/s LVOT diam:     2.00 cm  LV E/e' lateral: 11.8 LV SV:         52 LV SV Index:   27 LVOT Area:     3.14 cm  RIGHT VENTRICLE RV Basal diam:  5.30 cm RV S prime:     12.00 cm/s TAPSE (M-mode): 3.5 cm LEFT ATRIUM             Index       RIGHT ATRIUM           Index LA diam:        3.70 cm 1.88 cm/m  RA Area:     29.00 cm LA Vol (A2C):   75.4 ml 38.32 ml/m RA Volume:   120.00 ml 60.98 ml/m LA Vol (A4C):   34.6 ml 17.58 ml/m LA Biplane Vol: 53.2 ml 27.04 ml/m  AORTIC VALVE                    PULMONIC VALVE AV Area (Vmax):    1.86 cm     PV Vmax:        0.59 m/s AV Area (Vmean):   1.65 cm     PV Peak grad:   1.4 mmHg AV Area (VTI):     1.68 cm     RVOT Peak grad: 2 mmHg AV Vmax:           141.00 cm/s AV Vmean:          103.350 cm/s AV VTI:            0.310 m AV Peak Grad:      8.0 mmHg AV Mean Grad:      5.0 mmHg LVOT Vmax:         83.60 cm/s LVOT Vmean:         54.200 cm/s LVOT VTI:          0.166 m LVOT/AV VTI ratio: 0.54  AORTA Ao Root diam: 2.40 cm MITRAL VALVE                TRICUSPID VALVE MV Area (PHT): 3.60 cm     TR Peak grad:   60.2 mmHg MV Decel Time: 211 msec     TR Vmax:        388.00 cm/s MV E velocity: 107.00 cm/s MV A velocity: 104.00 cm/s  SHUNTS MV E/A ratio:  1.03         Systemic VTI:  0.17 m                             Systemic Diam: 2.00 cm Bartholome Bill MD Electronically signed by Bartholome Bill MD Signature Date/Time: 06/07/2020/10:42:33 AM    Final     Wt Readings from Last 3 Encounters:  06/10/20 86.4 kg  05/31/20 80.7 kg  05/25/20 83.5 kg    EKG: Sinus rhythm  Physical Exam:  Blood pressure (!) 130/45, pulse 61, temperature 97.9 F (36.6 C), temperature source Oral,  resp. rate 15, height 5\' 5"  (1.651 m), weight 86.4 kg, SpO2 97 %. Body mass index is 31.7 kg/m. General: Well developed, well nourished, in no acute distress. Head: Normocephalic, atraumatic, sclera non-icteric, no xanthomas, nares are without discharge.  Neck: Negative for carotid bruits. JVD not elevated. Lungs: Clear bilaterally to auscultation without wheezes, rales, or rhonchi. Breathing is unlabored. Heart: RRR with S1 S2. No murmurs, rubs, or gallops appreciated. Abdomen: Soft, non-tender, non-distended with normoactive bowel sounds. No hepatomegaly. No rebound/guarding. No obvious abdominal masses. Msk:  Strength and tone appear normal for age. Extremities: No clubbing or cyanosis. No edema.  Distal pedal pulses are 2+ and equal bilaterally. Neuro: Alert and oriented X 3. No facial asymmetry. No focal deficit. Moves all extremities spontaneously. Psych:  Responds to questions appropriately with a normal affect.     Assessment and Plan  59 year old female with history of Enterococcus bacteremia.  Asked to do transesophageal echo to evaluate for evidence of endocarditis.  Will optimize electrolytes and consider proceeding with TEE later today.  Will  need anesthesia assistance with sedation.  Signed, Teodoro Spray MD 06/11/2020, 7:43 AM Pager: [phone]

## 2020-06-11 NOTE — Care Plan (Signed)
Belongings brought to ICU 2 bags, dentures, phone. Room 10

## 2020-06-11 NOTE — Anesthesia Procedure Notes (Signed)
Procedure Name: Intubation Date/Time: 06/11/2020 9:40 AM Performed by: Aline Brochure, CRNA Pre-anesthesia Checklist: Patient identified, Emergency Drugs available, Suction available and Patient being monitored Patient Re-evaluated:Patient Re-evaluated prior to induction Oxygen Delivery Method: Circle system utilized Preoxygenation: Pre-oxygenation with 100% oxygen Induction Type: IV induction Ventilation: Mask ventilation without difficulty Laryngoscope Size: McGraph and 3 Grade View: Grade I Tube type: Oral Tube size: 7.0 mm Number of attempts: 1 Airway Equipment and Method: Stylet,  Oral airway and Video-laryngoscopy Placement Confirmation: ETT inserted through vocal cords under direct vision,  positive ETCO2 and breath sounds checked- equal and bilateral Secured at: 22 cm Tube secured with: Tape Dental Injury: Teeth and Oropharynx as per pre-operative assessment

## 2020-06-11 NOTE — Progress Notes (Signed)
Occupational Therapy Discharge Patient Details Name: [name] MRN: [id number] DOB: 11/28/1960 Today's Date: 06/11/2020    Pt with functional decline and transferred to ICU. OT will complete order and SIGN OFF. Please reconsult when pt is able/appropriate to participate in therapeutic intervention.       Darleen Crocker, [place], OTR/L , CBIS ascom [phone]  06/11/20, 12:18 PM  06/11/2020, 12:17 PM

## 2020-06-11 NOTE — Transfer of Care (Signed)
Immediate Anesthesia Transfer of Care Note  Patient: [name]  Procedure(s) Performed: TRANSESOPHAGEAL ECHOCARDIOGRAM (TEE) (N/A )  Patient Location: ICU  Anesthesia Type:General  Level of Consciousness: unresponsive and Patient remains intubated per anesthesia plan  Airway & Oxygen Therapy: Patient remains intubated per anesthesia plan and Patient placed on Ventilator (see vital sign flow sheet for setting)  Post-op Assessment: Report given to RN  Post vital signs: Reviewed  Last Vitals:  Vitals Value Taken Time  BP 158/59   Temp    Pulse 68   Resp 12   SpO2 100     Last Pain:  Vitals:   06/11/20 0845  TempSrc: Oral  PainSc:       Patients Stated Pain Goal: 0 (19/41/74 0814)  Complications: No complications documented.

## 2020-06-11 NOTE — Progress Notes (Signed)
Pt. Transferred to CT scan now with resp. Therapist Fritz Pickerel and RN Charlie from ICU. Stable for transport via bed with support staff, on tele.

## 2020-06-11 NOTE — Progress Notes (Signed)
[facility]  ROUNDING NOTE   Subjective:   Seen and examined this morning before her TEE. Events from this morning noted.   Hemodialysis treatment yesterday. Tolerated treatment well. UF of 1.5 liters.   Objective:  Vital signs in last 24 hours:  Temp:  [97.9 F (36.6 C)-98.1 F (36.7 C)] 97.9 F (36.6 C) (09/20 1039) Pulse Rate:  [46-61] 46 (09/20 1039) Resp:  [15-21] 21 (09/20 1039) BP: (130-170)/(40-100) 151/100 (09/20 1039) SpO2:  [97 %-100 %] 98 % (09/20 1039) FiO2 (%):  [24 %] 24 % (09/20 1039) Weight:  [82.6 kg-86.4 kg] 82.6 kg (09/20 1039)  Weight change:  Filed Weights   06/10/20 0500 06/11/20 0845 06/11/20 1039  Weight: 86.4 kg 86.4 kg 82.6 kg    Intake/Output: I/O last 3 completed shifts: In: 688.4 [P.O.:360; IV Piggyback:328.4] Out: 1500 [Other:1500]   Intake/Output this shift:  Total I/O In: 250 [I.V.:250] Out: -   Physical Exam: General: NAD,   Head: Normocephalic, atraumatic. Moist oral mucosal membranes  Eyes: Anicteric, PERRL  Neck: Supple, trachea midline  Lungs:  Clear to auscultation  Heart: Regular rate and rhythm  Abdomen:  Soft, nontender,   Extremities:  trace peripheral edema.  Neurologic: Nonfocal, moving all four extremities  Skin: No lesions  Access: Right Hero graft, tender and erythematous swollen area. Right femoral temporary HD catheter     Basic Metabolic Panel: Recent Labs  Lab 06/05/20 1325 06/05/20 1325 06/06/20 0612 06/08/20 1722 06/10/20 2119 06/11/20 0648  NA 134*  --  131*  --  135 130*  K 3.8  --  3.7  --  2.9* 3.3*  CL 93*  --  93*  --  96* 94*  CO2 27  --  27  --  27 26  GLUCOSE 268*  --  239*  --  98 115*  BUN 15  --  18  --  12 14  CREATININE 4.30*  --  5.03*  --  4.04* 4.87*  CALCIUM 7.8*   < > 7.8*  --  7.8* 7.9*  PHOS  --   --   --  1.9* 1.5*  --    < > = values in this interval not displayed.    Liver Function Tests: Recent Labs  Lab 06/10/20 2119  ALBUMIN 2.7*   No results  for input(s): LIPASE, AMYLASE in the last 168 hours. No results for input(s): AMMONIA in the last 168 hours.  CBC: Recent Labs  Lab 06/05/20 1325 06/06/20 0612 06/10/20 2119  WBC 8.3 5.7 4.6  HGB 8.2* 8.2* 8.4*  HCT 27.0* 25.2* 25.3*  MCV 93.8 89.7 86.6  PLT 77* 78* 187    Cardiac Enzymes: No results for input(s): CKTOTAL, CKMB, CKMBINDEX, TROPONINI in the last 168 hours.  BNP: Invalid input(s): POCBNP  CBG: Recent Labs  Lab 06/10/20 1740 06/11/20 0013 06/11/20 0428 06/11/20 0743 06/11/20 1028  GLUCAP 85 94 120* 113* 138*    Microbiology: Results for orders placed or performed during the hospital encounter of 06/02/20  SARS Coronavirus 2 by RT PCR (hospital order, performed in [facility] hospital lab) Nasopharyngeal Nasopharyngeal Swab     Status: None   Collection Time: 06/03/20  6:49 AM   Specimen: Nasopharyngeal Swab  Result Value Ref Range Status   SARS Coronavirus 2 NEGATIVE NEGATIVE Final    Comment: (NOTE) SARS-CoV-2 target nucleic acids are NOT DETECTED.  The SARS-CoV-2 RNA is generally detectable in upper and lower respiratory specimens during the acute phase of infection.  The lowest concentration of SARS-CoV-2 viral copies this assay can detect is 250 copies / mL. A negative result does not preclude SARS-CoV-2 infection and should not be used as the sole basis for treatment or other patient management decisions.  A negative result may occur with improper specimen collection / handling, submission of specimen other than nasopharyngeal swab, presence of viral mutation(s) within the areas targeted by this assay, and inadequate number of viral copies (<250 copies / mL). A negative result must be combined with clinical observations, patient history, and epidemiological information.  Fact Sheet for Patients:   StrictlyIdeas.no  Fact Sheet for Healthcare Providers: BankingDealers.co.za  This test is not yet  approved or  cleared by the [place] FDA and has been authorized for detection and/or diagnosis of SARS-CoV-2 by FDA under an Emergency Use Authorization (EUA).  This EUA will remain in effect (meaning this test can be used) for the duration of the COVID-19 declaration under Section 564(b)(1) of the Act, 21 U.S.C. section 360bbb-3(b)(1), unless the authorization is terminated or revoked sooner.  Performed at [facility], [address] [place], [place] [address]   Gastrointestinal Panel by PCR , Stool     Status: None   Collection Time: 06/04/20  7:39 PM   Specimen: Stool  Result Value Ref Range Status   Campylobacter species NOT DETECTED NOT DETECTED Final   Plesimonas shigelloides NOT DETECTED NOT DETECTED Final   Salmonella species NOT DETECTED NOT DETECTED Final   Yersinia enterocolitica NOT DETECTED NOT DETECTED Final   Vibrio species NOT DETECTED NOT DETECTED Final   Vibrio cholerae NOT DETECTED NOT DETECTED Final   Enteroaggregative E coli (EAEC) NOT DETECTED NOT DETECTED Final   Enteropathogenic E coli (EPEC) NOT DETECTED NOT DETECTED Final   Enterotoxigenic E coli (ETEC) NOT DETECTED NOT DETECTED Final   Shiga like toxin producing E coli (STEC) NOT DETECTED NOT DETECTED Final   Shigella/Enteroinvasive E coli (EIEC) NOT DETECTED NOT DETECTED Final   Cryptosporidium NOT DETECTED NOT DETECTED Final   Cyclospora cayetanensis NOT DETECTED NOT DETECTED Final   Entamoeba histolytica NOT DETECTED NOT DETECTED Final   Giardia lamblia NOT DETECTED NOT DETECTED Final   Adenovirus F40/41 NOT DETECTED NOT DETECTED Final   Astrovirus NOT DETECTED NOT DETECTED Final   Norovirus GI/GII NOT DETECTED NOT DETECTED Final   Rotavirus A NOT DETECTED NOT DETECTED Final   Sapovirus (I, II, IV, and V) NOT DETECTED NOT DETECTED Final    Comment: Performed at [facility], [address] [place], [place] [address]  Culture, blood (Routine X 2) w Reflex to ID Panel      Status: Abnormal   Collection Time: 06/04/20  8:49 PM   Specimen: BLOOD  Result Value Ref Range Status   Specimen Description   Final    BLOOD BLOOD LEFT FOREARM Performed at [facility], [address], [place], [place] [address]    Special Requests   Final    BOTTLES DRAWN AEROBIC AND ANAEROBIC Blood Culture adequate volume Performed at [facility], [address] [place], [place] [address]    Culture  Setup Time   Final    IN BOTH AEROBIC AND ANAEROBIC BOTTLES GRAM POSITIVE COCCI Organism ID to follow CRITICAL RESULT CALLED TO, READ BACK BY AND VERIFIED WITH: CHARLES SHANLEVER @1636  06/06/20 MJU LISA KLUTTZ AT 1019 ON 06/07/2020 [place]. Performed at [facility], [place] [address], [place], [place] [address]    Culture ENTEROCOCCUS FAECALIS (A)  Final   Report Status 06/08/2020 FINAL  Final   Organism ID, Bacteria ENTEROCOCCUS FAECALIS  Final      Susceptibility   Enterococcus faecalis - MIC*    AMPICILLIN <=2 SENSITIVE Sensitive     VANCOMYCIN 1 SENSITIVE Sensitive     GENTAMICIN SYNERGY SENSITIVE Sensitive     * ENTEROCOCCUS FAECALIS  Blood Culture ID Panel (Reflexed)     Status: Abnormal   Collection Time: 06/04/20  8:49 PM  Result Value Ref Range Status   Enterococcus faecalis DETECTED (A) NOT DETECTED Final    Comment: CRITICAL RESULT CALLED TO, READ BACK BY AND VERIFIED WITH: CHARLES SHANLEVER @1636  06/06/20 MJU    Enterococcus Faecium NOT DETECTED NOT DETECTED Final   Listeria monocytogenes NOT DETECTED NOT DETECTED Final   Staphylococcus species NOT DETECTED NOT DETECTED Final   Staphylococcus aureus (BCID) NOT DETECTED NOT DETECTED Final   Staphylococcus epidermidis NOT DETECTED NOT DETECTED Final   Staphylococcus lugdunensis NOT DETECTED NOT DETECTED Final   Streptococcus species NOT DETECTED NOT DETECTED Final   Streptococcus agalactiae NOT DETECTED NOT DETECTED Final   Streptococcus pneumoniae NOT DETECTED NOT DETECTED Final    Streptococcus pyogenes NOT DETECTED NOT DETECTED Final   A.calcoaceticus-baumannii NOT DETECTED NOT DETECTED Final   Bacteroides fragilis NOT DETECTED NOT DETECTED Final   Enterobacterales NOT DETECTED NOT DETECTED Final   Enterobacter cloacae complex NOT DETECTED NOT DETECTED Final   Escherichia coli NOT DETECTED NOT DETECTED Final   Klebsiella aerogenes NOT DETECTED NOT DETECTED Final   Klebsiella oxytoca NOT DETECTED NOT DETECTED Final   Klebsiella pneumoniae NOT DETECTED NOT DETECTED Final   Proteus species NOT DETECTED NOT DETECTED Final   Salmonella species NOT DETECTED NOT DETECTED Final   Serratia marcescens NOT DETECTED NOT DETECTED Final   Haemophilus influenzae NOT DETECTED NOT DETECTED Final   Neisseria meningitidis NOT DETECTED NOT DETECTED Final   Pseudomonas aeruginosa NOT DETECTED NOT DETECTED Final   Stenotrophomonas maltophilia NOT DETECTED NOT DETECTED Final   Candida albicans NOT DETECTED NOT DETECTED Final   Candida auris NOT DETECTED NOT DETECTED Final   Candida glabrata NOT DETECTED NOT DETECTED Final   Candida krusei NOT DETECTED NOT DETECTED Final   Candida parapsilosis NOT DETECTED NOT DETECTED Final   Candida tropicalis NOT DETECTED NOT DETECTED Final   Cryptococcus neoformans/gattii NOT DETECTED NOT DETECTED Final   Vancomycin resistance NOT DETECTED NOT DETECTED Final    Comment: Performed at [facility], [address] [place], [place] [address]  Culture, blood (Routine X 2) w Reflex to ID Panel     Status: Abnormal   Collection Time: 06/05/20  1:25 PM   Specimen: BLOOD  Result Value Ref Range Status   Specimen Description   Final    BLOOD LAC Performed at [facility], [address], [place], [place] [address]    Special Requests   Final    BOTTLES DRAWN AEROBIC AND ANAEROBIC Blood Culture adequate volume Performed at [facility], [address] [place], [place] [address]    Culture  Setup Time   Final    Organism  ID to follow ANAEROBIC BOTTLE ONLY CRITICAL RESULT CALLED TO, READ BACK BY AND VERIFIED WITH: JASON ROBBINS 06/07/20 AT 1506 BY ACR GRAM POSITIVE COCCI PREVIOUSLY REPORTED AS: GRAM NEGATIVE COCCOBACILLI CORRECTED RESULTS CALLED TO: D,Ziegler PHARMD, at [place] 06/07/20 BY E. BENTON    Culture (A)  Final    ENTEROCOCCUS FAECALIS SUSCEPTIBILITIES PERFORMED ON PREVIOUS CULTURE WITHIN THE LAST 5  DAYS. Performed at [facility], [place] [name] [place], [place] [address]    Report Status 06/08/2020 FINAL  Final  Blood Culture ID Panel (Reflexed)     Status: Abnormal   Collection Time: 06/05/20  1:25 PM  Result Value Ref Range Status   Enterococcus faecalis DETECTED (A) NOT DETECTED Final    Comment: CRITICAL RESULT CALLED TO, READ BACK BY AND VERIFIED WITH: JASON ROBBINS 06/07/20 AT 1506 BY ACR    Enterococcus Faecium NOT DETECTED NOT DETECTED Final   Listeria monocytogenes NOT DETECTED NOT DETECTED Final   Staphylococcus species NOT DETECTED NOT DETECTED Final   Staphylococcus aureus (BCID) NOT DETECTED NOT DETECTED Final   Staphylococcus epidermidis NOT DETECTED NOT DETECTED Final   Staphylococcus lugdunensis NOT DETECTED NOT DETECTED Final   Streptococcus species NOT DETECTED NOT DETECTED Final   Streptococcus agalactiae NOT DETECTED NOT DETECTED Final   Streptococcus pneumoniae NOT DETECTED NOT DETECTED Final   Streptococcus pyogenes NOT DETECTED NOT DETECTED Final   A.calcoaceticus-baumannii NOT DETECTED NOT DETECTED Final   Bacteroides fragilis NOT DETECTED NOT DETECTED Final   Enterobacterales NOT DETECTED NOT DETECTED Final   Enterobacter cloacae complex NOT DETECTED NOT DETECTED Final   Escherichia coli NOT DETECTED NOT DETECTED Final   Klebsiella aerogenes NOT DETECTED NOT DETECTED Final   Klebsiella oxytoca NOT DETECTED NOT DETECTED Final   Klebsiella pneumoniae NOT DETECTED NOT DETECTED Final   Proteus species NOT DETECTED NOT DETECTED Final   Salmonella species NOT  DETECTED NOT DETECTED Final   Serratia marcescens NOT DETECTED NOT DETECTED Final   Haemophilus influenzae NOT DETECTED NOT DETECTED Final   Neisseria meningitidis NOT DETECTED NOT DETECTED Final   Pseudomonas aeruginosa NOT DETECTED NOT DETECTED Final   Stenotrophomonas maltophilia NOT DETECTED NOT DETECTED Final   Candida albicans NOT DETECTED NOT DETECTED Final   Candida auris NOT DETECTED NOT DETECTED Final   Candida glabrata NOT DETECTED NOT DETECTED Final   Candida krusei NOT DETECTED NOT DETECTED Final   Candida parapsilosis NOT DETECTED NOT DETECTED Final   Candida tropicalis NOT DETECTED NOT DETECTED Final   Cryptococcus neoformans/gattii NOT DETECTED NOT DETECTED Final   Vancomycin resistance NOT DETECTED NOT DETECTED Final    Comment: Performed at [facility], [address] [place], [place] [address]  CULTURE, BLOOD (ROUTINE X 2) w Reflex to ID Panel     Status: None (Preliminary result)   Collection Time: 06/07/20  7:02 PM   Specimen: BLOOD  Result Value Ref Range Status   Specimen Description BLOOD BLOOD LEFT FOREARM  Final   Special Requests   Final    BOTTLES DRAWN AEROBIC AND ANAEROBIC Blood Culture results may not be optimal due to an inadequate volume of blood received in culture bottles   Culture   Final    NO GROWTH 4 DAYS Performed at [facility], [address] [place],  [address]    Report Status PENDING  Incomplete  CULTURE, BLOOD (ROUTINE X 2) w Reflex to ID Panel     Status: None (Preliminary result)   Collection Time: 06/07/20  8:46 PM   Specimen: BLOOD  Result Value Ref Range Status   Specimen Description BLOOD BLOOD LEFT HAND  Final   Special Requests   Final    BOTTLES DRAWN AEROBIC ONLY Blood Culture results may not be optimal due to an inadequate volume of blood received in culture bottles   Culture   Final    NO GROWTH 4 DAYS Performed  at [facility], [address] [place], [place] [address]    Report  Status PENDING  Incomplete    Coagulation Studies: No results for input(s): LABPROT, INR in the last 72 hours.  Urinalysis: No results for input(s): COLORURINE, LABSPEC, PHURINE, GLUCOSEU, HGBUR, BILIRUBINUR, KETONESUR, PROTEINUR, UROBILINOGEN, NITRITE, LEUKOCYTESUR in the last 72 hours.  Invalid input(s): APPERANCEUR    Imaging: CT HEAD WO CONTRAST  Result Date: 06/11/2020 CLINICAL DATA:  Hypotension during PE.  History of dialysis EXAM: CT HEAD WITHOUT CONTRAST TECHNIQUE: Contiguous axial images were obtained from the base of the skull through the vertex without intravenous contrast. COMPARISON:  Six days ago FINDINGS: Brain: No evidence of acute infarction, hemorrhage, hydrocephalus, extra-axial collection or mass lesion/mass effect. Vascular: Atherosclerotic calcification Skull: Generalized sclerotic appearance of the bones, likely renal. Sinuses/Orbits: Bilateral cataract resection.  No acute finding IMPRESSION: No acute finding. Electronically Signed   By: Monte Fantasia M.D.   On: 06/11/2020 10:36   CT CHEST WO CONTRAST  Result Date: 06/11/2020 CLINICAL DATA:  Hypotensive following transesophageal echocardiogram. EXAM: CT CHEST WITHOUT CONTRAST TECHNIQUE: Multidetector CT imaging of the chest was performed following the standard protocol without IV contrast. COMPARISON:  CT scan 05/22/2020 FINDINGS: Cardiovascular: The heart is mildly enlarged but stable. Stable tortuosity and calcification of the thoracic aorta. Stable three-vessel coronary artery calcifications. Right IJ Vas-Cath in good position without complicating features. Left brachiocephalic vein stent is stable. Mediastinum/Nodes: Stable scattered mediastinal and hilar lymph nodes. The esophagus is grossly normal. Lungs/Pleura: Persistent vascular congestion without overt pulmonary edema. There are small bilateral pleural effusions and lower lobe interstitial changes which have improved. Mild lower lobe peribronchial thickening.  No infiltrates or pneumothorax. Upper Abdomen: No significant upper abdominal findings. Stable advanced vascular calcifications. Bilateral renal atrophy. Musculoskeletal: No significant bony findings. IMPRESSION: 1. Persistent vascular congestion without overt pulmonary edema. 2. Small bilateral pleural effusions and lower lobe interstitial changes which have improved. 3. No infiltrates or pneumothorax. 4. Stable advanced atherosclerotic calcifications involving the thoracic and abdominal aorta and branch vessels including the coronary arteries. 5. Aortic atherosclerosis. Aortic Atherosclerosis (ICD10-I70.0). Electronically Signed   By: Marijo Sanes M.D.   On: 06/11/2020 10:46     Medications:   . ampicillin (OMNIPEN) IV 2 g (06/11/20 0109)  . gentamicin     . aspirin EC  81 mg Oral Daily  . atorvastatin  20 mg Oral Daily  . atropine      . butamben-tetracaine-benzocaine      . carvedilol  6.25 mg Oral BID WC  . Chlorhexidine Gluconate Cloth  6 each Topical Daily  . cholestyramine light  4 g Oral BID  . epoetin (EPOGEN/PROCRIT) injection  10,000 Units Intravenous Q M,W,F-HD  . ferrous sulfate  325 mg Oral Daily  . furosemide  80 mg Oral Daily  . gabapentin  100 mg Oral QPM  . heparin  5,000 Units Subcutaneous Q8H  . hydrOXYzine  25 mg Oral Daily  . lipase/protease/amylase  12,000 Units Oral TID WC  . multivitamin  1 tablet Oral Daily  . sevelamer carbonate  800 mg Oral TID WC  . sodium chloride flush      . cyanocobalamin  1,000 mcg Oral Daily   acetaminophen **OR** acetaminophen, calcium carbonate, camphor-menthol **AND** hydrOXYzine, feeding supplement (NEPRO CARB STEADY), hydrALAZINE, ipratropium-albuterol, loperamide, morphine injection, naLOXone (NARCAN)  injection, ondansetron **OR** ondansetron (ZOFRAN) IV, oxyCODONE-acetaminophen, promethazine, sorbitol, zolpidem  Assessment/ Plan:  [name] is a 59 y.o. black female with ESRD on  HD MWF, hypertension, COPD, diastolic  heart failure, depression, diabetes mellitus type 2, hyperlipidemia, with new dialysis access placement 04/12/2020 who is   [facility] MWF [facility] 81kg.left AVG  1. ESRD on HD MWF/volume overload/pulmonary edema. Hemodialysis treatment yesterday. Tolerated treatment well.  - Patient had right femoral temp dialysis catheter placed 9/18 - Dialysis for tomorrow. Then resume MWF schedule  2. Anemia of chronic kidney disease. Continue epogen 10000 units IV with HD.  3. Secondary hyperparathyroidism. Continue sevelamer with meals   4. Enterococcal bacetermia: On ampicillin and gentamicin. TEE today, prelim says no evidence of endocarditis - source may be from HERO graft   5. Hypertension:  - PRN hydralazine.    LOS: 8 Pennye Beeghly 9/20/202111:07 AM

## 2020-06-11 NOTE — Consult Note (Signed)
Name: [name] MRN: [id number] DOB: Apr 07, 1961     CONSULTATION DATE: 06/02/2020  REFERRING MD :  Ubaldo Glassing  CHIEF COMPLAINT: Resp Failure  HISTORY OF PRESENT ILLNESS:   59 y.o. female with medical history significant of end-stage renal disease on hemodialysis Mondays Wednesdays and Fridays, hypertension, COPD, diastolic dysfunction CHF, depression, diabetes, hyperlipidemia, who apparently missed her dialysis on Friday.     Came to the ER with complaining of significant shortness of breath and cough.   Patient was noted to have evidence of fluid overload.   Blood pressure also in the 200s with obvious hypertensive urgency.   Nephrology consulted and plan to do hemodialysis tonight.    Patient being admitted to the medical service for observation.  She is complaining of significant pain back.  Also having significant headaches..    ED Course: Temperature 97.9 blood pressure 128/86 pulse 67 respirate 23 oxygen sat 93% on room air.  White count 5.4 hemoglobin 8.8 and platelets 130.  Creatinine 6.60 BUN 25 calcium 8.7.  Rest of chemistry appeared to be within normal.  Chest x-ray showed features of CHF with cardiomegaly.  Also small bilateral pleural effusions.  Patient being admitted for hemodialysis.  9/20 TEE performed Tee revealed no evidence of endocartidits. Procedure complicated by hypotension and bradycaradia with heart rates dropping to mid 20's and blood pressure systolic to 26R.  Received 1 amp of atropine, amp of epinephrine and neosynephrine. Pt intubated to protect air way. Stat head ct and chest ct were unremarkable. Pt son notified emmediately and has been updated with care.    PAST MEDICAL HISTORY :   has a past medical history of Anemia, Anginal pain ([place]), Anxiety, Arthritis, Asthma, Broken wrist, Bronchitis, chronic diastolic CHF (4/85/4627), Chronic kidney disease, COPD (chronic obstructive pulmonary disease) ([place]), Coronary artery disease, Depression, Diabetes mellitus  ([place]), Diabetes mellitus without complication ([place]), Diabetic neuropathy ([place]), dialysis (2006), Diverticulosis, Dizziness, Dyspnea, Elevated lipids, Environmental and seasonal allergies, ESRD (end stage renal disease) on dialysis ([place]), Gastroparesis, GERD (gastroesophageal reflux disease), Headache, History of anemia due to chronic kidney disease, History of hiatal hernia, HOH (hard of hearing), pancreatitis (2015), Hypertension, Lower extremity edema, Mitral regurgitation, Myocardial infarction ([place]), Orthopnea, Parathyroid abnormality ([place]), Peripheral arterial disease ([place]), Pneumonia, Renal cancer ([place]), Renal insufficiency, and Wheezing.  has a past surgical history that includes Cholecystectomy; Appendectomy; Dialysis fistula creation (Left); Esophagogastroduodenoscopy (N/A, 03/08/2015); Cardiac catheterization (Left, 07/26/2015); Fecal transplant (N/A, 08/23/2015); Cardiac catheterization (N/A, 12/20/2015); Cardiac catheterization (N/A, 12/20/2015); Cardiac catheterization (N/A, 12/20/2015); Cardiac catheterization (N/A, 01/02/2016); Cardiac catheterization (N/A, 01/02/2016); Esophagogastroduodenoscopy (egd) with propofol (N/A, 03/18/2016); Colonoscopy with propofol (N/A, 08/12/2016); Hand surgery (Bilateral); Cataract extraction w/ intraocular lens implant (Right); dialysis grafts; Eye surgery (Right, 2018); Abdominal hysterectomy (1992); Cataract extraction w/PHACO (Left, 03/10/2017); Amputation toe (Left, 10/02/2017); A/V SHUNTOGRAM (Left, 01/20/2018); Artery Biopsy (Right, 10/11/2018); IR Radiologist Eval & Mgmt (07/28/2019); IR Radiologist Eval & Mgmt (08/11/2019); Ligation of arteriovenous  fistula (Left, 11/11/2019); Ligation of arteriovenous  fistula (Left, 11/11/2019); CENTRAL LINE INSERTION (Right, 11/11/2019); DIALYSIS/PERMA CATHETER INSERTION (N/A, 11/14/2019); I & D extremity (Left, 11/25/2019); Application if wound vac (N/A, 11/25/2019); CENTRAL LINE INSERTION (11/25/2019); Hematoma evacuation (Left, 11/25/2019);  UPPER EXTREMITY VENOGRAPHY (Right, 01/18/2020); DIALYSIS/PERMA CATHETER INSERTION (N/A, 02/03/2020); IR Fluoro Guide CV Line Right (04/06/2020); Vascular access device insertion (Right, 04/13/2020); and DIALYSIS/PERMA CATHETER REMOVAL (N/A, 05/25/2020). Prior to Admission medications   Medication Sig Start Date End Date Taking? Authorizing Provider  albuterol (PROVENTIL) (2.5 MG/3ML) 0.083% nebulizer solution Take 2.5 mg by nebulization every  4 (four) hours as needed for wheezing or shortness of breath.   Yes [provider]  albuterol (VENTOLIN HFA) 108 (90 Base) MCG/ACT inhaler Inhale 2 puffs into the lungs every 4 (four) hours as needed for wheezing or shortness of breath. 11/09/19  Yes [provider]  ammonium lactate (LAC-HYDRIN) 12 % lotion Apply 1 application topically 2 (two) times daily as needed for dry skin.    Yes [provider]  Ascorbic Acid (VITA-C PO) Take 1 tablet by mouth daily.   Yes [provider]  aspirin EC 81 MG tablet Take 81 mg by mouth daily.   Yes [provider]  atorvastatin (LIPITOR) 20 MG tablet Take 20 mg by mouth every evening.  10/24/19  Yes [provider]  calcium acetate (PHOSLO) 667 MG capsule Take 667-2,001 mg by mouth See admin instructions. Take 3 capsules (205m) by mouth three times a day with meals and take 1 capsule (6671m by mouth daily with a snack   Yes [provider]  Cholecalciferol (VITAMIN D3) 25 MCG (1000 UT) CAPS Take 1,000 Units by mouth daily.    Yes [provider]  cholestyramine light (PREVALITE) 4 g packet Take 4 g by mouth 2 (two) times daily.   Yes [provider]  CREON 12000 units CPEP capsule Take 12,000-36,000 Units by mouth See admin instructions. Take 3 capsules (36,000u) by mouth three times daily with meals and take 1 capsule (12,000u) by mouth daily with a snack 10/21/19  Yes [provider]  cyanocobalamin 1000 MCG tablet Take 1,000 mcg by mouth  daily.    Yes [provider]  diphenhydrAMINE (BENADRYL) 25 MG tablet Take 25 mg by mouth every 6 (six) hours as needed for itching.   Yes [provider]  Ferrous Sulfate (IRON) 325 (65 Fe) MG TABS Take 325 mg by mouth daily.    Yes [provider]  furosemide (LASIX) 80 MG tablet Take 80 mg by mouth daily. 05/08/20  Yes [provider]  gabapentin (NEURONTIN) 100 MG capsule Take 100 mg by mouth every evening.    Yes [provider]  HYDROcodone-acetaminophen (NORCO/VICODIN) 5-325 MG tablet Take 1 tablet by mouth 4 (four) times daily as needed for moderate pain or severe pain.    Yes [provider]  hydrOXYzine (ATARAX/VISTARIL) 25 MG tablet Take 25 mg by mouth daily.    Yes [provider]  insulin aspart (NOVOLOG) 100 UNIT/ML injection Inject 4-15 Units into the skin in the morning, at noon, in the evening, and at bedtime. Sliding scale   Yes [provider]  insulin detemir (LEVEMIR) 100 UNIT/ML injection Inject 67 Units into the skin at bedtime.   Yes [provider]  multivitamin (RENA-VIT) TABS tablet Take 1 tablet by mouth daily.    Yes [provider]  mupirocin ointment (BACTROBAN) 2 % Apply 1 application topically daily as needed (leg rash).  11/09/19  Yes [provider]  nitroGLYCERIN (NITROSTAT) 0.4 MG SL tablet Place 0.4 mg under the tongue every 5 (five) minutes as needed for chest pain.    Yes [provider]  Omega-3 300 MG CAPS Take 300 mg by mouth 2 (two) times daily. Every other day opposite omega XL   Yes [provider]  pantoprazole (PROTONIX) 40 MG tablet Take 40 mg by mouth daily. 08/28/19  Yes [provider]  Probiotic Product (PROBIOTIC PO) Take 1 capsule by mouth daily.   Yes [provider]  sevelamer  carbonate (RENVELA) 800 MG tablet Take 800 mg by mouth 4 (four) times daily. (at meals and with a snack)   Yes [provider]   traMADol (ULTRAM) 50 MG tablet Take 1 tablet (50 mg total) by mouth every 8 (eight) hours as needed for moderate pain or severe pain. 05/21/20  Yes Schnier, Dolores Lory, MD  meclizine (ANTIVERT) 25 MG tablet Take 25 mg by mouth every 6 (six) hours.  11/01/19   [provider]   Allergies  Allergen Reactions  . Ace Inhibitors Swelling and Anaphylaxis  . Ativan [Lorazepam] Other (See Comments)    Reaction:  Hallucinations and headaches  . Compazine [Prochlorperazine Edisylate] Anaphylaxis, Nausea And Vomiting and Other (See Comments)    Other reaction(s): dystonia from this vs. Reglan, 23 Jul - patient relates that she takes promethazine frequently with no problems  . Sumatriptan Succinate Other (See Comments)    Other reaction(s): delirium and hallucinations per [facility] records  . Zofran [Ondansetron] Nausea And Vomiting    Per pt. she is allergic to zofran or will experience adverse reaction like hallucination   . [place] Nausea And Vomiting  . Losartan Nausea Only  . Prochlorperazine Other (See Comments)    Reaction:  Unknown . Patient does not remember reaction but she does have vertigo and anxiety along with n and v at times. Could be used to treat any of these   . Reglan [Metoclopramide] Other (See Comments)    Per patient her Dr. Evelina Bucy her off it   . Scopolamine Other (See Comments)    Dizziness, also has vertigo already  . Tape Rash    Plastic tape causes rash  . Tapentadol Rash    FAMILY HISTORY:  family history includes Cancer in her father; Diabetes in her mother; Kidney disease in her mother and sister. SOCIAL HISTORY:  reports that she has never smoked. She has never used smokeless tobacco. She reports previous alcohol use. She reports current drug use. Drug: Marijuana.  REVIEW OF SYSTEMS:   Unable to obtain due to critical illness      Estimated body mass index is 30.3 kg/m as calculated from the following:   Height as of this encounter: 5' 5"  (1.651 m).    Weight as of this encounter: 82.6 kg.    VITAL SIGNS: Temp:  [97.9 F (36.6 C)-98.1 F (36.7 C)] 97.9 F (36.6 C) (09/20 1039) Pulse Rate:  [38-61] 38 (09/20 1100) Resp:  [15-21] 18 (09/20 1100) BP: (130-179)/(44-106) 179/106 (09/20 1100) SpO2:  [96 %-100 %] 96 % (09/20 1100) FiO2 (%):  [24 %] 24 % (09/20 1127) Weight:  [82.6 kg-86.4 kg] 82.6 kg (09/20 1039)   I/O last 3 completed shifts: In: 688.4 [P.O.:360; IV Piggyback:328.4] Out: 1500 [Other:1500] Total I/O In: 250.6 [I.V.:250.6] Out: -    SpO2: 96 % O2 Flow Rate (L/min):  (Intubated ) FiO2 (%): 24 %   Physical Examination:  GENERAL:critically ill appearing, +resp distress HEAD: Normocephalic, atraumatic.  EYES: Pupils equal, round, reactive to light.  No scleral icterus.  MOUTH: Moist mucosal membrane. NECK: Supple. No JVD.  PULMONARY: +rhonchi, +wheezing CARDIOVASCULAR: S1 and S2. Regular rate and rhythm. No murmurs, rubs, or gallops.  GASTROINTESTINAL: Soft, nontender, -distended.  Positive bowel sounds.  MUSCULOSKELETAL: No swelling, clubbing, or edema.  NEUROLOGIC: obtunded SKIN:intact,warm,dry  MEDICATIONS: I have reviewed all medications and confirmed regimen as documented   CULTURE RESULTS   Recent Results (from the past 240 hour(s))  SARS Coronavirus 2 by RT PCR (hospital  order, performed in [facility] hospital lab) Nasopharyngeal Nasopharyngeal Swab     Status: None   Collection Time: 06/03/20  6:49 AM   Specimen: Nasopharyngeal Swab  Result Value Ref Range Status   SARS Coronavirus 2 NEGATIVE NEGATIVE Final    Comment: (NOTE) SARS-CoV-2 target nucleic acids are NOT DETECTED.  The SARS-CoV-2 RNA is generally detectable in upper and lower respiratory specimens during the acute phase of infection. The lowest concentration of SARS-CoV-2 viral copies this assay can detect is 250 copies / mL. A negative result does not preclude SARS-CoV-2 infection and should not be used as the sole basis for  treatment or other patient management decisions.  A negative result may occur with improper specimen collection / handling, submission of specimen other than nasopharyngeal swab, presence of viral mutation(s) within the areas targeted by this assay, and inadequate number of viral copies (<250 copies / mL). A negative result must be combined with clinical observations, patient history, and epidemiological information.  Fact Sheet for Patients:   StrictlyIdeas.no  Fact Sheet for Healthcare Providers: BankingDealers.co.za  This test is not yet approved or  cleared by the [place] FDA and has been authorized for detection and/or diagnosis of SARS-CoV-2 by FDA under an Emergency Use Authorization (EUA).  This EUA will remain in effect (meaning this test can be used) for the duration of the COVID-19 declaration under Section 564(b)(1) of the Act, 21 U.S.C. section 360bbb-3(b)(1), unless the authorization is terminated or revoked sooner.  Performed at [facility], [address] [place], [place] [address]   Gastrointestinal Panel by PCR , Stool     Status: None   Collection Time: 06/04/20  7:39 PM   Specimen: Stool  Result Value Ref Range Status   Campylobacter species NOT DETECTED NOT DETECTED Final   Plesimonas shigelloides NOT DETECTED NOT DETECTED Final   Salmonella species NOT DETECTED NOT DETECTED Final   Yersinia enterocolitica NOT DETECTED NOT DETECTED Final   Vibrio species NOT DETECTED NOT DETECTED Final   Vibrio cholerae NOT DETECTED NOT DETECTED Final   Enteroaggregative E coli (EAEC) NOT DETECTED NOT DETECTED Final   Enteropathogenic E coli (EPEC) NOT DETECTED NOT DETECTED Final   Enterotoxigenic E coli (ETEC) NOT DETECTED NOT DETECTED Final   Shiga like toxin producing E coli (STEC) NOT DETECTED NOT DETECTED Final   Shigella/Enteroinvasive E coli (EIEC) NOT DETECTED NOT DETECTED Final   Cryptosporidium NOT  DETECTED NOT DETECTED Final   Cyclospora cayetanensis NOT DETECTED NOT DETECTED Final   Entamoeba histolytica NOT DETECTED NOT DETECTED Final   Giardia lamblia NOT DETECTED NOT DETECTED Final   Adenovirus F40/41 NOT DETECTED NOT DETECTED Final   Astrovirus NOT DETECTED NOT DETECTED Final   Norovirus GI/GII NOT DETECTED NOT DETECTED Final   Rotavirus A NOT DETECTED NOT DETECTED Final   Sapovirus (I, II, IV, and V) NOT DETECTED NOT DETECTED Final    Comment: Performed at [facility], [address] [place], [place] [address]  Culture, blood (Routine X 2) w Reflex to ID Panel     Status: Abnormal   Collection Time: 06/04/20  8:49 PM   Specimen: BLOOD  Result Value Ref Range Status   Specimen Description   Final    BLOOD BLOOD LEFT FOREARM Performed at [facility], [address], [place], [place] [address]    Special Requests   Final    BOTTLES DRAWN AEROBIC AND ANAEROBIC Blood Culture adequate volume Performed at [facility], [place]  [address] [place], [place] [address]    Culture  Setup Time   Final    IN BOTH AEROBIC AND ANAEROBIC BOTTLES GRAM POSITIVE COCCI Organism ID to follow CRITICAL RESULT CALLED TO, READ BACK BY AND VERIFIED WITH: CHARLES SHANLEVER @1636  06/06/20 MJU LISA KLUTTZ AT 0177 ON 06/07/2020 Anthony. Performed at [facility], [place] [address], [place], [place] [address]    Culture ENTEROCOCCUS FAECALIS (A)  Final   Report Status 06/08/2020 FINAL  Final   Organism ID, Bacteria ENTEROCOCCUS FAECALIS  Final      Susceptibility   Enterococcus faecalis - MIC*    AMPICILLIN <=2 SENSITIVE Sensitive     VANCOMYCIN 1 SENSITIVE Sensitive     GENTAMICIN SYNERGY SENSITIVE Sensitive     * ENTEROCOCCUS FAECALIS  Blood Culture ID Panel (Reflexed)     Status: Abnormal   Collection Time: 06/04/20  8:49 PM  Result Value Ref Range Status   Enterococcus faecalis DETECTED (A) NOT DETECTED Final    Comment: CRITICAL RESULT CALLED TO, READ BACK BY  AND VERIFIED WITH: CHARLES SHANLEVER @1636  06/06/20 MJU    Enterococcus Faecium NOT DETECTED NOT DETECTED Final   Listeria monocytogenes NOT DETECTED NOT DETECTED Final   Staphylococcus species NOT DETECTED NOT DETECTED Final   Staphylococcus aureus (BCID) NOT DETECTED NOT DETECTED Final   Staphylococcus epidermidis NOT DETECTED NOT DETECTED Final   Staphylococcus lugdunensis NOT DETECTED NOT DETECTED Final   Streptococcus species NOT DETECTED NOT DETECTED Final   Streptococcus agalactiae NOT DETECTED NOT DETECTED Final   Streptococcus pneumoniae NOT DETECTED NOT DETECTED Final   Streptococcus pyogenes NOT DETECTED NOT DETECTED Final   A.calcoaceticus-baumannii NOT DETECTED NOT DETECTED Final   Bacteroides fragilis NOT DETECTED NOT DETECTED Final   Enterobacterales NOT DETECTED NOT DETECTED Final   Enterobacter cloacae complex NOT DETECTED NOT DETECTED Final   Escherichia coli NOT DETECTED NOT DETECTED Final   Klebsiella aerogenes NOT DETECTED NOT DETECTED Final   Klebsiella oxytoca NOT DETECTED NOT DETECTED Final   Klebsiella pneumoniae NOT DETECTED NOT DETECTED Final   Proteus species NOT DETECTED NOT DETECTED Final   Salmonella species NOT DETECTED NOT DETECTED Final   Serratia marcescens NOT DETECTED NOT DETECTED Final   Haemophilus influenzae NOT DETECTED NOT DETECTED Final   Neisseria meningitidis NOT DETECTED NOT DETECTED Final   Pseudomonas aeruginosa NOT DETECTED NOT DETECTED Final   Stenotrophomonas maltophilia NOT DETECTED NOT DETECTED Final   Candida albicans NOT DETECTED NOT DETECTED Final   Candida auris NOT DETECTED NOT DETECTED Final   Candida glabrata NOT DETECTED NOT DETECTED Final   Candida krusei NOT DETECTED NOT DETECTED Final   Candida parapsilosis NOT DETECTED NOT DETECTED Final   Candida tropicalis NOT DETECTED NOT DETECTED Final   Cryptococcus neoformans/gattii NOT DETECTED NOT DETECTED Final   Vancomycin resistance NOT DETECTED NOT DETECTED Final     Comment: Performed at [facility], [address] [place], [place] [address]  Culture, blood (Routine X 2) w Reflex to ID Panel     Status: Abnormal   Collection Time: 06/05/20  1:25 PM   Specimen: BLOOD  Result Value Ref Range Status   Specimen Description   Final    BLOOD LAC Performed at [facility] [address] [place], [place] [address]    Special Requests   Final    BOTTLES DRAWN AEROBIC AND ANAEROBIC Blood Culture adequate volume Performed at [facility], [address] [place], [place] [address]    Culture  Setup Time   Final    Organism ID to follow ANAEROBIC BOTTLE ONLY CRITICAL RESULT CALLED TO, READ BACK BY AND VERIFIED WITH: JASON ROBBINS 06/07/20 AT 1506 BY ACR GRAM POSITIVE COCCI PREVIOUSLY REPORTED AS: GRAM NEGATIVE COCCOBACILLI CORRECTED RESULTS CALLED TO: D,Ziegler PHARMD, at [place] 06/07/20 BY E. BENTON    Culture (A)  Final    ENTEROCOCCUS FAECALIS SUSCEPTIBILITIES PERFORMED ON PREVIOUS CULTURE WITHIN THE LAST 5 DAYS. Performed at [facility], [place] [address] [place], [place] [address]    Report Status 06/08/2020 FINAL  Final  Blood Culture ID Panel (Reflexed)     Status: Abnormal   Collection Time: 06/05/20  1:25 PM  Result Value Ref Range Status   Enterococcus faecalis DETECTED (A) NOT DETECTED Final    Comment: CRITICAL RESULT CALLED TO, READ BACK BY AND VERIFIED WITH: JASON ROBBINS 06/07/20 AT 1506 BY ACR    Enterococcus Faecium NOT DETECTED NOT DETECTED Final   Listeria monocytogenes NOT DETECTED NOT DETECTED Final   Staphylococcus species NOT DETECTED NOT DETECTED Final   Staphylococcus aureus (BCID) NOT DETECTED NOT DETECTED Final   Staphylococcus epidermidis NOT DETECTED NOT DETECTED Final   Staphylococcus lugdunensis NOT DETECTED NOT DETECTED Final   Streptococcus species NOT DETECTED NOT DETECTED Final   Streptococcus agalactiae NOT DETECTED NOT DETECTED Final   Streptococcus pneumoniae NOT DETECTED NOT  DETECTED Final   Streptococcus pyogenes NOT DETECTED NOT DETECTED Final   A.calcoaceticus-baumannii NOT DETECTED NOT DETECTED Final   Bacteroides fragilis NOT DETECTED NOT DETECTED Final   Enterobacterales NOT DETECTED NOT DETECTED Final   Enterobacter cloacae complex NOT DETECTED NOT DETECTED Final   Escherichia coli NOT DETECTED NOT DETECTED Final   Klebsiella aerogenes NOT DETECTED NOT DETECTED Final   Klebsiella oxytoca NOT DETECTED NOT DETECTED Final   Klebsiella pneumoniae NOT DETECTED NOT DETECTED Final   Proteus species NOT DETECTED NOT DETECTED Final   Salmonella species NOT DETECTED NOT DETECTED Final   Serratia marcescens NOT DETECTED NOT DETECTED Final   Haemophilus influenzae NOT DETECTED NOT DETECTED Final   Neisseria meningitidis NOT DETECTED NOT DETECTED Final   Pseudomonas aeruginosa NOT DETECTED NOT DETECTED Final   Stenotrophomonas maltophilia NOT DETECTED NOT DETECTED Final   Candida albicans NOT DETECTED NOT DETECTED Final   Candida auris NOT DETECTED NOT DETECTED Final   Candida glabrata NOT DETECTED NOT DETECTED Final   Candida krusei NOT DETECTED NOT DETECTED Final   Candida parapsilosis NOT DETECTED NOT DETECTED Final   Candida tropicalis NOT DETECTED NOT DETECTED Final   Cryptococcus neoformans/gattii NOT DETECTED NOT DETECTED Final   Vancomycin resistance NOT DETECTED NOT DETECTED Final    Comment: Performed at [facility], [address] [place], [place] [address]  CULTURE, BLOOD (ROUTINE X 2) w Reflex to ID Panel     Status: None (Preliminary result)   Collection Time: 06/07/20  7:02 PM   Specimen: BLOOD  Result Value Ref Range Status   Specimen Description BLOOD BLOOD LEFT FOREARM  Final   Special Requests   Final    BOTTLES DRAWN AEROBIC AND ANAEROBIC Blood Culture results may not be optimal due to an inadequate volume of blood received in culture bottles   Culture   Final    NO GROWTH 4 DAYS Performed at [facility], [address] [place], [place] [address]    Report Status PENDING  Incomplete  CULTURE, BLOOD (ROUTINE X 2) w Reflex to ID Panel     Status: None (Preliminary  result)   Collection Time: 06/07/20  8:46 PM   Specimen: BLOOD  Result Value Ref Range Status   Specimen Description BLOOD BLOOD LEFT HAND  Final   Special Requests   Final    BOTTLES DRAWN AEROBIC ONLY Blood Culture results may not be optimal due to an inadequate volume of blood received in culture bottles   Culture   Final    NO GROWTH 4 DAYS Performed at [facility], [address], [place], [place] [address]    Report Status PENDING  Incomplete  MRSA PCR Screening     Status: None   Collection Time: 06/11/20 10:40 AM   Specimen: Nasopharyngeal  Result Value Ref Range Status   MRSA by PCR NEGATIVE NEGATIVE Final    Comment:        The GeneXpert MRSA Assay (FDA approved for NASAL specimens only), is one component of a comprehensive MRSA colonization surveillance program. It is not intended to diagnose MRSA infection nor to guide or monitor treatment for MRSA infections. Performed at [facility], [address] [place],  [address]           IMAGING    CT HEAD WO CONTRAST  Result Date: 06/11/2020 CLINICAL DATA:  Hypotension during PE.  History of dialysis EXAM: CT HEAD WITHOUT CONTRAST TECHNIQUE: Contiguous axial images were obtained from the base of the skull through the vertex without intravenous contrast. COMPARISON:  Six days ago FINDINGS: Brain: No evidence of acute infarction, hemorrhage, hydrocephalus, extra-axial collection or mass lesion/mass effect. Vascular: Atherosclerotic calcification Skull: Generalized sclerotic appearance of the bones, likely renal. Sinuses/Orbits: Bilateral cataract resection.  No acute finding IMPRESSION: No acute finding. Electronically Signed   By: Monte Fantasia M.D.   On: 06/11/2020 10:36   CT CHEST WO CONTRAST  Result Date: 06/11/2020 CLINICAL  DATA:  Hypotensive following transesophageal echocardiogram. EXAM: CT CHEST WITHOUT CONTRAST TECHNIQUE: Multidetector CT imaging of the chest was performed following the standard protocol without IV contrast. COMPARISON:  CT scan 05/22/2020 FINDINGS: Cardiovascular: The heart is mildly enlarged but stable. Stable tortuosity and calcification of the thoracic aorta. Stable three-vessel coronary artery calcifications. Right IJ Vas-Cath in good position without complicating features. Left brachiocephalic vein stent is stable. Mediastinum/Nodes: Stable scattered mediastinal and hilar lymph nodes. The esophagus is grossly normal. Lungs/Pleura: Persistent vascular congestion without overt pulmonary edema. There are small bilateral pleural effusions and lower lobe interstitial changes which have improved. Mild lower lobe peribronchial thickening. No infiltrates or pneumothorax. Upper Abdomen: No significant upper abdominal findings. Stable advanced vascular calcifications. Bilateral renal atrophy. Musculoskeletal: No significant bony findings. IMPRESSION: 1. Persistent vascular congestion without overt pulmonary edema. 2. Small bilateral pleural effusions and lower lobe interstitial changes which have improved. 3. No infiltrates or pneumothorax. 4. Stable advanced atherosclerotic calcifications involving the thoracic and abdominal aorta and branch vessels including the coronary arteries. 5. Aortic atherosclerosis. Aortic Atherosclerosis (ICD10-I70.0). Electronically Signed   By: Marijo Sanes M.D.   On: 06/11/2020 10:46   ECHO TEE  Result Date: 06/11/2020    TRANSESOPHOGEAL ECHO REPORT   Patient Name:   [name] Date of Exam: 06/11/2020 Medical Rec #:  [id number]   Height:       65.0 in Accession #:    [id number]  Weight:       190.5 lb Date of Birth:  [birth year]    BSA:          1.938 m Patient Age:    72 years  BP:           147/52 mmHg Patient Gender: F           HR:           56 bpm. Exam Location:  [facility]  Procedure: Transesophageal Echo, Cardiac Doppler and Color Doppler Indications:     Bacteremia 790.7  History:         Patient has prior history of Echocardiogram examinations, most                  recent 06/07/2020. COPD, Signs/Symptoms:Dyspnea; Risk                  Factors:Hypertension.  Sonographer:     Sherrie Sport RDCS (AE) Referring Phys:  [id number] Teodoro Spray Diagnosing Phys: Bartholome Bill MD PROCEDURE: The transesophogeal probe was passed without difficulty through the esophogus of the patient. Sedation performed by different physician. The patient was monitored while under deep sedation. Image quality was excellent. The patient developed Respiratory depression and hypotension and bradycardia during the procedure. IMPRESSIONS  1. Left ventricular ejection fraction, by estimation, is 50 to 55%. The left ventricle has normal function.  2. Right ventricular systolic function is normal. The right ventricular size is normal.  3. Left atrial size was mildly dilated. No left atrial/left atrial appendage thrombus was detected.  4. Right atrial size was mildly dilated.  5. The mitral valve is grossly normal. Mild to moderate mitral valve regurgitation.  6. Tricuspid valve regurgitation is mild to moderate.  7. The aortic valve is tricuspid. Aortic valve regurgitation is not visualized. FINDINGS  Left Ventricle: Left ventricular ejection fraction, by estimation, is 50 to 55%. The left ventricle has normal function. The left ventricular internal cavity size was normal in size. Right Ventricle: The right ventricular size is normal. No increase in right ventricular wall thickness. Right ventricular systolic function is normal. Left Atrium: Left atrial size was mildly dilated. No left atrial/left atrial appendage thrombus was detected. Right Atrium: Right atrial size was mildly dilated. Pericardium: There is no evidence of pericardial effusion. Mitral Valve: The mitral valve is grossly normal. Mild to moderate mitral valve  regurgitation. There is no evidence of mitral valve vegetation. Tricuspid Valve: The tricuspid valve is grossly normal. Tricuspid valve regurgitation is mild to moderate. There is no evidence of tricuspid valve vegetation. Aortic Valve: The aortic valve is tricuspid. Aortic valve regurgitation is not visualized. There is no evidence of aortic valve vegetation. Pulmonic Valve: The pulmonic valve was not well visualized. Pulmonic valve regurgitation is not visualized. Aorta: The aortic root is normal in size and structure. IAS/Shunts: The interatrial septum was not assessed. Bartholome Bill MD Electronically signed by Bartholome Bill MD Signature Date/Time: 06/11/2020/12:41:00 PM    Final       Indwelling Urinary Catheter continued, requirement due to   Reason to continue Indwelling Urinary Catheter strict Intake/Output monitoring for hemodynamic instability         Ventilator continued, requirement due to severe respiratory failure   Ventilator Sedation RASS 0 to -2      ASSESSMENT AND PLAN SYNOPSIS  59 yo obese female with multiple medical issues with enterococcus bacteremia s/p TEE with post op complication with near cardiac arrest with acute severe resp failure   Severe ACUTE Hypoxic and Hypercapnic Respiratory Failure due to acute hypotension and near cardiac arrest due to sedative medications with underlying multiple medical conditions with severe sepsis and bacteremia   -continue Full MV support -continue Bronchodilator  Therapy -Wean Fio2 and PEEP as tolerated -will perform SAT/SBT when respiratory parameters are met -VAP/VENT bundle implementation  ACUTE DIASTOLIC CARDIAC FAILURE- Vent support  Morbid obesity, possible OSA.   Will certainly impact respiratory mechanics, ventilator weaning Suspect will need to consider additional PEEP   KIDNEY INJURY/Renal Failure ESRD -Avoid nephrotoxic agents -Ensure adequate renal perfusion, optimize oxygenation -Renal dose medications HD  as needed     NEUROLOGY Acute toxic metabolic encephalopathy, need for sedation Goal RASS -2 to -3 Will consider MRI brain -high likelihood of acute CVA   CARDIAC ICU monitoring  ID -continue IV abx as prescibed -follow up cultures  GI GI PROPHYLAXIS as indicated  NUTRITIONAL STATUS DIET-->NPO Constipation protocol as indicated   ENDO - will use ICU hypoglycemic\Hyperglycemia protocol if needed    ELECTROLYTES -follow labs as needed -replace as needed -pharmacy consultation and following    DVT/GI PRX ordered and assessed TRANSFUSIONS AS NEEDED MONITOR FSBS I Assessed the need for Labs I Assessed the need for Foley I Assessed the need for Central Venous Line Family Discussion when available I Assessed the need for Mobilization I made an Assessment of medications to be adjusted accordingly Safety Risk assessment Completed  CASE DISCUSSED IN MULTIDISCIPLINARY ROUNDS WITH ICU TEAM   Critical Care Time devoted to patient care services described in this note is 75 minutes.   Overall, patient is critically ill, prognosis is guarded.  Patient with Multiorgan failure and at high risk for cardiac arrest and death.   Son [name] updated and notified of critical condition.  Corrin Parker, M.D.  [facility]  Medical Director [place] Director [facility] Cardio-Pulmonary Department

## 2020-06-11 NOTE — Progress Notes (Signed)
Physical Therapy Discharge Patient Details Name: [name] MRN: [id number] DOB: 07-Oct-1960 Today's Date: 06/11/2020 Time:  -     Patient discharged from PT services secondary to intubation and transfer to ICU.  Will discharge at this time and await new orders when appropriate.  [name] 06/11/2020, 11:12 AM

## 2020-06-11 NOTE — Progress Notes (Signed)
Case discussed with Dr. Ubaldo Glassing, cardiologist.  Patient became bradycardic and hypotensive during TEE.  She was intubated by anesthesiologist and transferred to the ICU.  I have discussed the case with Dr. Mortimer Fries, intensivist.  He will take over the care of this patient.

## 2020-06-11 NOTE — Progress Notes (Signed)
New #22G angiocath started to Left upper arm for IV support by S. Ebony Hail, RN. Resp. Therapist & ICU transport RN arrived at 1000 for transport support. Dr. Ubaldo Glassing evaluating pt. & performing neuro checks at bedside now.

## 2020-06-11 NOTE — Treatment Plan (Signed)
Report called to [facility] in ICU at this time.

## 2020-06-11 NOTE — Progress Notes (Signed)
*  PRELIMINARY RESULTS* Echocardiogram Echocardiogram Transesophageal has been performed.  Sherrie Sport 06/11/2020, 9:44 AM

## 2020-06-11 NOTE — Procedures (Signed)
   TRANSESOPHAGEAL ECHOCARDIOGRAM   NAME:  [name]   MRN: [id number] DOB:  [birth year]   ADMIT DATE: 06/02/2020  INDICATIONS:  Bacteremia PROCEDURE:   Informed consent was obtained prior to the procedure. The risks, benefits and alternatives for the procedure were discussed and the patient comprehended these risks.  Risks include, but are not limited to, cough, sore throat, vomiting, nausea, somnolence, esophageal and stomach trauma or perforation, bleeding, low blood pressure, aspiration, pneumonia, infection, trauma to the teeth and death.    After a procedural time-out, the patient was given propofol per dept on anesthesia for sedation.   The oropharynx was anesthetized3cc of topical 1% viscous lidocaine.  The transesophageal probe was inserted in the esophagus and stomach without difficulty and multiple views were obtained. I was present for the entire procedure.    COMPLICATIONS:    See below.  FINDINGS:  LEFT VENTRICLE: EF = 55%. No regional wall motion abnormalities.  RIGHT VENTRICLE: Normal size and function.   LEFT ATRIUM: normal size  LEFT ATRIAL APPENDAGE: No thrombus.   RIGHT ATRIUM: normal  AORTIC VALVE:  Trileaflet. No vegetations  MITRAL VALVE:    Normal. No vegetations. Trivial mr  TRICUSPID VALVE: Normal. No vegetations. Trivial tr  PULMONIC VALVE: Grossly normal.  INTERATRIAL SEPTUM: Intact grossly.   PERICARDIUM: No effusion     CONCLUSION: [name] revealed no evidence of endocartidits. Procedure complicated by hypotension and bradycaradia with heart rates dropping to mid 20's and blood pressure systolic to 71I.  Received 1 amp of atropine, amp of epinephrine and neosynephrine. Pt intubated to protect air way. Stat head ct and chest ct were unremarkable. Pt son notified emmediately and has been updated with care.

## 2020-06-11 NOTE — Progress Notes (Signed)
Pt. HR , BP and RR dropped during TEE. See anesthesia notes and flow chart.  HR down to the 20's, BP to 60's for 10 min. Total, minimal respirations. Anesthesia "bagged" pt. , Epi and Atropine given by anesthesia. Pt. Intubated approx. 09:40 secondary to minimal respiratory efforts. by pt.. BP returned to 110's, HR to 70's approx. 0942. Dr. Fletcher Anon at bedside approx. 0935 on consult with Dr. Ubaldo Glassing. Pt. Continued to be manually bagged by Anesthesia. CT of head and chest ordered by Dr. Ubaldo Glassing. Dr. Ubaldo Glassing phoned pt. Son [name] at 09:50 re: TEE/ course of events. Resp. Paged and ICU RN paged to Transport pt. To CT, then to ICU.

## 2020-06-11 NOTE — Progress Notes (Signed)
[name] MRN: [id number] DOB: 04-06-1961   Cancelled Treatment:    Reason Eval/Treat Not Completed: Patient at procedure or test/unavailable   Pt out of room for dialysis.     Chesley Noon 06/11/2020, 9:51 AM

## 2020-06-11 NOTE — Anesthesia Preprocedure Evaluation (Addendum)
Anesthesia Evaluation  Patient identified by MRN, date of birth, ID band Patient awake    Reviewed: Allergy & Precautions, H&P , NPO status , Patient's Chart, lab work & pertinent test results  History of Anesthesia Complications Negative for: history of anesthetic complications  Airway Mallampati: II       Dental  (+) Edentulous Lower, Edentulous Upper   Pulmonary shortness of breath, neg sleep apnea, COPD, Patient abstained from smoking.,  Reports breathing improved significantly since admission   breath sounds clear to auscultation       Cardiovascular hypertension, (-) angina+ CAD, + Past MI, + Cardiac Stents, + Peripheral Vascular Disease and +CHF  (-) dysrhythmias  Rhythm:regular Rate:Bradycardia  TTE 06/07/20: 1. Left ventricular ejection fraction, by estimation, is 60 to 65%. The  left ventricle has normal function. The left ventricle has no regional  wall motion abnormalities. There is mild left ventricular hypertrophy.  Left ventricular diastolic parameters  are consistent with Grade I diastolic dysfunction (impaired relaxation).  2. Right ventricular systolic function was not well visualized. The right  ventricular size is mildly enlarged.  3. Right atrial size was mildly dilated.  4. The mitral valve is grossly normal. Trivial mitral valve  regurgitation.  5. Tricuspid valve regurgitation is moderate.  6. The aortic valve is tricuspid. Aortic valve regurgitation is not  visualized.    Neuro/Psych  Headaches, PSYCHIATRIC DISORDERS Anxiety Depression Peripheral neuropathy TIA   GI/Hepatic Neg liver ROS, hiatal hernia, GERD  ,  Endo/Other  diabetesSecondary hyperparathyroidism  Renal/GU ESRF and DialysisRenal disease  negative genitourinary   Musculoskeletal   Abdominal   Peds  Hematology  (+) Blood dyscrasia, anemia , Hgb 8.4   Anesthesia Other Findings ESRD on HD, HTN, DM, CAD, COPD, PVD  admitted on 9/11 after coming in with s/sxs concerning for PNA vs sepsis, volume overload and COPD exacerbation.   Here for TEE to r/o endocarditis  CV: no pressors Resp: 97% RA Access: 20G EJ, fem dialysis catheter  Past Medical History: No date: Anemia No date: Anginal pain ([place]) No date: Anxiety No date: Arthritis No date: Asthma No date: Broken wrist No date: Bronchitis 5/46/5681: chronic diastolic CHF No date: Chronic kidney disease No date: COPD (chronic obstructive pulmonary disease) (HCC) No date: Coronary artery disease     Comment:  a. cath 2013: stenting to RCA (report not available); b.              cath 2014: LM nl, pLAD 40%, mLAD nl, ost LCx 40%, mid LCx              nl, pRCA 30% @ site of prior stent, mRCA 50% No date: Depression No date: Diabetes mellitus ([place]) No date: Diabetes mellitus without complication ([place]) No date: Diabetic neuropathy ([place]) 2006: dialysis No date: Diverticulosis No date: Dizziness No date: Dyspnea No date: Elevated lipids No date: Environmental and seasonal allergies No date: ESRD (end stage renal disease) on dialysis ([place])     Comment:  M-W-F No date: Gastroparesis No date: GERD (gastroesophageal reflux disease) No date: Headache No date: History of anemia due to chronic kidney disease No date: History of hiatal hernia No date: HOH (hard of hearing) 30: Hx of pancreatitis No date: Hypertension No date: Lower extremity edema No date: Mitral regurgitation     Comment:  a. echo 10/2013: EF 62%, noWMA, mildly dilated LA, mild               to mod MR/TR, GR1DD No date:  Myocardial infarction ([place]) No date: Orthopnea No date: Parathyroid abnormality (HCC) No date: Peripheral arterial disease ([place]) No date: Pneumonia No date: Renal cancer ([place]) No date: Renal insufficiency     Comment:  Pt is on dialysis on M,W + F. No date: Wheezing  Past Surgical History: 01/20/2018: A/V SHUNTOGRAM; Left     Comment:  Procedure: A/V  SHUNTOGRAM;  Surgeon: Algernon Huxley, MD;                Location: [facility];  Service: Cardiovascular;              Laterality: Left; 1992: ABDOMINAL HYSTERECTOMY 10/02/2017: AMPUTATION TOE; Left     Comment:  Procedure: AMPUTATION TOE-LEFT GREAT TOE;  Surgeon:               Albertine Patricia, DPM;  Location: [facility];  Service:               Podiatry;  Laterality: Left; No date: APPENDECTOMY 03/25/8269: APPLICATION OF WOUND VAC; N/A     Comment:  Procedure: APPLICATION OF WOUND VAC;  Surgeon: Katha Cabal, MD;  Location: [facility];  Service: Vascular;                Laterality: N/A; 10/11/2018: ARTERY BIOPSY; Right     Comment:  Procedure: BIOPSY TEMPORAL ARTERY;  Surgeon: Vickie Epley, MD;  Location: [facility];  Service: General;                Laterality: Right; 07/26/2015: CARDIAC CATHETERIZATION; Left     Comment:  Procedure: Left Heart Cath and Coronary Angiography;                Surgeon: Dionisio David, MD;  Location: [place] [facility];  Service: Cardiovascular;  Laterality: Left; No date: CATARACT EXTRACTION W/ INTRAOCULAR LENS IMPLANT; Right 03/10/2017: CATARACT EXTRACTION W/PHACO; Left     Comment:  Procedure: CATARACT EXTRACTION PHACO AND INTRAOCULAR               LENS PLACEMENT (IOC);  Surgeon: Birder Robson, MD;                Location: [facility];  Service: Ophthalmology;  Laterality:              Left;  [place] 00:51.9 AP% 14.2 CDE 7.39 fluid pack lot #               [id number] 11/11/2019: CENTRAL LINE INSERTION; Right     Comment:  Procedure: [place];  Surgeon: Katha Cabal, MD;  Location: [facility];  Service: Vascular;                Laterality: Right; 11/25/2019: CENTRAL LINE INSERTION     Comment:  Procedure: CENTRAL LINE INSERTION;  Surgeon: Katha Cabal, MD;  Location: [facility];  Service: Vascular;; No date: CHOLECYSTECTOMY 08/12/2016: COLONOSCOPY WITH  PROPOFOL; N/A     Comment:  Procedure: COLONOSCOPY WITH PROPOFOL;  Surgeon: Billie Ruddy  Gustavo Lah, MD;  Location: [facility];  Service:               Endoscopy;  Laterality: N/A; No date: DIALYSIS FISTULA CREATION; Left     Comment:  upper arm No date: dialysis grafts 11/14/2019: DIALYSIS/PERMA CATHETER INSERTION; N/A     Comment:  Procedure: DIALYSIS/PERMA CATHETER INSERTION;  Surgeon:               Algernon Huxley, MD;  Location: [place] [facility];                Service: Cardiovascular;  Laterality: N/A; 02/03/2020: DIALYSIS/PERMA CATHETER INSERTION; N/A     Comment:  Procedure: DIALYSIS/PERMA CATHETER INSERTION;  Surgeon:               Katha Cabal, MD;  Location: [place] [facility];               Service: Cardiovascular;  Laterality: N/A; 05/25/2020: DIALYSIS/PERMA CATHETER REMOVAL; N/A     Comment:  Procedure: DIALYSIS/PERMA CATHETER REMOVAL;  Surgeon:               Katha Cabal, MD;  Location: [place] [facility];               Service: Cardiovascular;  Laterality: N/A; 03/08/2015: ESOPHAGOGASTRODUODENOSCOPY; N/A     Comment:  Procedure: ESOPHAGOGASTRODUODENOSCOPY (EGD);  Surgeon:               Manya Silvas, MD;  Location: [facility];                Service: Endoscopy;  Laterality: N/A; 03/18/2016: ESOPHAGOGASTRODUODENOSCOPY (EGD) WITH PROPOFOL; N/A     Comment:  Procedure: ESOPHAGOGASTRODUODENOSCOPY (EGD) WITH               PROPOFOL;  Surgeon: [name], MD;  Location: [facility]               ENDOSCOPY;  Service: Endoscopy;  Laterality: N/A; 2018: EYE SURGERY; Right 08/23/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; N/A     Comment:  Procedure: FECAL TRANSPLANT;  Surgeon: Manya Silvas,              MD;  Location: [facility];  Service: Endoscopy;                Laterality: N/A; No date: HAND SURGERY; Bilateral 11/25/2019: HEMATOMA EVACUATION; Left     Comment:  Procedure: EVACUATION HEMATOMA;  Surgeon: Katha Cabal, MD;  Location: [facility];   Service: Vascular;                Laterality: Left; 11/25/2019: I & D EXTREMITY; Left     Comment:  Procedure: IRRIGATION AND DEBRIDEMENT EXTREMITY;                Surgeon: Katha Cabal, MD;  Location: [facility];                Service: Vascular;  Laterality: Left; 04/06/2020: IR FLUORO GUIDE CV LINE RIGHT 07/28/2019: IR RADIOLOGIST EVAL & MGMT 08/11/2019: IR RADIOLOGIST EVAL & MGMT 11/11/2019: LIGATION OF ARTERIOVENOUS  FISTULA; Left 11/11/2019: LIGATION OF ARTERIOVENOUS  FISTULA; Left     Comment:  Procedure: LIGATION OF ARTERIOVENOUS  FISTULA;  Surgeon:              Katha Cabal, MD;  Location: [facility];  Service:  Vascular;  Laterality: Left; 12/20/2015: PERIPHERAL VASCULAR CATHETERIZATION; N/A     Comment:  Procedure: Thrombectomy of dialysis access versus               permcath placement;  Surgeon: Algernon Huxley, MD;  Location:              [place] [facility];  Service: Cardiovascular;                Laterality: N/A; 12/20/2015: PERIPHERAL VASCULAR CATHETERIZATION; N/A     Comment:  Procedure: A/V Shunt Intervention;  Surgeon: Algernon Huxley, MD;  Location: [place] [facility];  Service:               Cardiovascular;  Laterality: N/A; 12/20/2015: PERIPHERAL VASCULAR CATHETERIZATION; N/A     Comment:  Procedure: A/V Shuntogram/Fistulagram;  Surgeon: Algernon Huxley, MD;  Location: [place] [facility];  Service:               Cardiovascular;  Laterality: N/A; 01/02/2016: PERIPHERAL VASCULAR CATHETERIZATION; N/A     Comment:  Procedure: A/V Shuntogram/Fistulagram;  Surgeon: Algernon Huxley, MD;  Location: [place] [facility];  Service:               Cardiovascular;  Laterality: N/A; 01/02/2016: PERIPHERAL VASCULAR CATHETERIZATION; N/A     Comment:  Procedure: A/V Shunt Intervention;  Surgeon: Algernon Huxley, MD;  Location: [place] [facility];  Service:               Cardiovascular;  Laterality: N/A; 01/18/2020: UPPER  EXTREMITY VENOGRAPHY; Right     Comment:  Procedure: UPPER EXTREMITY VENOGRAPHY;  Surgeon:               Katha Cabal, MD;  Location: [place] [facility];               Service: Cardiovascular;  Laterality: Right; 04/13/2020: VASCULAR ACCESS DEVICE INSERTION; Right     Comment:  Procedure: INSERTION OF HERO VASCULAR ACCESS DEVICE               (GRAFT);  Surgeon: Katha Cabal, MD;  Location:               [facility];  Service: Vascular;  Laterality: Right;  BMI    Body Mass Index: 31.70 kg/m      Reproductive/Obstetrics negative OB ROS                            Anesthesia Physical Anesthesia Plan  ASA: IV  Anesthesia Plan: General   Post-op Pain Management:    Induction:   PONV Risk Score and Plan: Propofol infusion and TIVA  Airway Management Planned:   Additional Equipment:   Intra-op Plan:   Post-operative Plan:   Informed Consent: I have reviewed the patients History and Physical, chart, labs and discussed the procedure including the risks, benefits and alternatives for the proposed anesthesia with the patient or authorized representative who has indicated his/her understanding and acceptance.     Dental Advisory Given  Plan Discussed with: Anesthesiologist, CRNA and Surgeon  Anesthesia Plan Comments:  Anesthesia Quick Evaluation  

## 2020-06-11 NOTE — Progress Notes (Signed)
[facility] Daily Progress Note   06/09/20: 1. Ultrasound guidance for vascular access Right femoral vein 2. Placement of a temporary 13 fr/20cm dialysis catheter right femoral vein  04/13/20: 1. Creation of right arm prosthetic A-V graft, HERO graft 2. Placement of venous outflow, intravascular portion of HERO graft same venous access 3. Introduction catheter into superior vena cava 4. Removal of existing permacath right external jugular vein with placement of a new tunneled catheter right common femoral vein 5. Ultrasound-guided placement of 8 French venous sheath, right neck  Duplex (06/09/20): Heterogeneous fluid collection adjacent to patent right upper extremity dialysis graft, with the differential including blood products/hematoma versus abscess given the history  Assessment/Planning: 1) Patient with positive blood cultures. 2) Fluid collection noted at the distal aspect of dialysis access on physical exam. 3) Duplex notable for heterogeneous fluid collection. 4) Was planning to take patient to operating room tomorrow to remove hero graft however patient with severe bradycardia, hypotension and respiratory arrest needing intubation and transferred to the ICU. 5) We will plan on OR Wednesday with Dr. Delana Meyer if patient condition improves.  Discussed with Dr. Eber Hong Dorien [place] PA-C 06/11/2020 10:48 AM

## 2020-06-11 NOTE — Progress Notes (Signed)
ID Pt had TEE today after which she developed bradycardia and hypotension and had to be intubated and is in ICU  Patient Vitals for the past 24 hrs:  BP Temp Temp src Pulse Resp SpO2 Height Weight  06/11/20 1700 (!) 162/63 -- -- 67 16 100 % -- --  06/11/20 1600 (!) 166/62 98.3 F (36.8 C) Oral 69 13 100 % -- --  06/11/20 1500 (!) 167/70 -- -- 65 19 100 % -- --  06/11/20 1400 (!) 158/64 98.3 F (36.8 C) Oral 63 19 100 % -- --  06/11/20 1300 (!) 154/82 -- -- 65 (!) 23 100 % -- --  06/11/20 1200 (!) 177/67 -- -- 66 (!) 26 100 % -- --  06/11/20 1100 (!) 179/106 -- -- (!) 38 18 96 % -- --  06/11/20 1039 (!) 151/100 97.9 F (36.6 C) Axillary (!) 46 (!) 21 98 % 5\' 5"  (1.651 m) 82.6 kg  06/11/20 1006 (!) 158/59 -- -- (!) 56 -- 100 % -- --  06/11/20 1000 (!) 168/69 -- -- 60 -- 100 % -- --  06/11/20 0901 (!) 147/52 -- -- (!) 56 17 98 % -- --  06/11/20 0855 -- -- -- (!) 55 -- 100 % -- --  06/11/20 0850 -- -- -- (!) 54 -- 100 % -- --  06/11/20 0845 (!) 147/52 98.1 F (36.7 C) Oral (!) 56 17 100 % 5\' 5"  (1.651 m) 86.4 kg  06/11/20 0756 (!) 135/55 -- -- 61 -- 100 % -- --  06/11/20 0429 (!) 130/45 97.9 F (36.6 C) Oral 61 15 97 % -- --  06/10/20 2300 (!) 143/44 -- -- -- -- -- -- --  06/10/20 2245 (!) 149/49 -- -- -- -- -- -- --  06/10/20 2215 (!) 150/60 -- -- -- -- -- -- --  06/10/20 2200 (!) 155/50 -- -- -- -- -- -- --  06/10/20 2145 (!) 170/51 -- -- -- -- -- -- --  06/10/20 2130 (!) 168/50 -- -- -- -- -- -- --  06/10/20 2000 -- 97.9 F (36.6 C) -- -- -- -- -- --    intubated On propafol Chest b/l air entry [id number] And soft Rt femoral dialysis catheter    Imaging Heterogeneous fluid collection adjacent to patent right upper extremity dialysis graft, with the differential including blood products/hematoma versus abscess   Impression/recommendation Enterococcus bacteremiasource could be the graft site as there is a firm erythematous swelling rt arm. Discussed with vascular team-  arterial duplex shows heterogenous collection hematoma vs  Abscess- awaiting surgery Concern for endovascular infection- on  amp+ gent combo  TEE no endocarditis  repeat blood cultures Neg so far  Bradycardia/hypotension following TEE Intubated  congestive heart failure and pulmonary edema  Vegetation not seen on TEE. 2 d echoshows moderate tricuspid regurg   Accelerated hypertension- management as per primary team  DM gastroparesis with nausea/vomiting   ESRD on dialysis Anemia due to the above  thrombocytopenia  Discussed the management with her nurse

## 2020-06-11 NOTE — Progress Notes (Signed)
Patient was brought to the special procedures area for a transesophageal echo.  This was requested per infectious disease to evaluate for evidence of endocarditis in a patient with Enterococcus bacteremia.  Patient was sedated per anesthesia with propofol as per protocol.  Transesophageal echo probe was inserted and images obtained.  No evidence of endocarditis.  Through the procedure patient became hypotensive and bradycardic.  Probe was removed.  Patient received Neo-Synephrine, epinephrine and atropine with improvement in her heart rate and blood pressure.  She remained somewhat lethargic and was electively intubated for airway protection.  Her oxygenation good.  She was transferred to the CT suite where a brain CT and chest CT were completed without contrast.  No obvious abnormalities noted.  Patient was transported to the ICU intubated.  Her blood pressure was systolic in the 810Y to 548Y.  Her heart rate was in the 70s to 80s.  Patient son was informed of the outcome of the procedure and ongoing evaluation.  Etiology of the event is unclear.  Appears to be slow recovery from sedation.

## 2020-06-12 ENCOUNTER — Other Ambulatory Visit (INDEPENDENT_AMBULATORY_CARE_PROVIDER_SITE_OTHER): Payer: Self-pay | Admitting: Vascular Surgery

## 2020-06-12 ENCOUNTER — Inpatient Hospital Stay: Payer: Medicare HMO

## 2020-06-12 LAB — RENAL FUNCTION PANEL
Albumin: 2.8 g/dL — ABNORMAL LOW (ref 3.5–5.0)
Anion gap: 14 (ref 5–15)
BUN: 18 mg/dL (ref 8–23)
CO2: 23 mmol/L (ref 22–32)
Calcium: 8.3 mg/dL — ABNORMAL LOW (ref 8.9–10.3)
Chloride: 97 mmol/L — ABNORMAL LOW (ref 98–111)
Creatinine, Ser: 5.53 mg/dL — ABNORMAL HIGH (ref 0.44–1.00)
GFR calc Af Amer: 9 mL/min — ABNORMAL LOW (ref >60)
GFR calc non Af Amer: 8 mL/min — ABNORMAL LOW (ref >60)
Glucose, Bld: 91 mg/dL (ref 70–99)
Phosphorus: 1.9 mg/dL — ABNORMAL LOW (ref 2.5–4.6)
Potassium: 3.4 mmol/L — ABNORMAL LOW (ref 3.5–5.1)
Sodium: 134 mmol/L — ABNORMAL LOW (ref 135–145)

## 2020-06-12 LAB — CBC
HCT: 29.6 % — ABNORMAL LOW (ref 36.0–46.0)
HCT: 27.7 % — ABNORMAL LOW (ref 36.0–46.0)
Hemoglobin: 9.2 g/dL — ABNORMAL LOW (ref 12.0–15.0)
Hemoglobin: 9 g/dL — ABNORMAL LOW (ref 12.0–15.0)
MCH: 28.8 pg (ref 26.0–34.0)
MCH: 28.8 pg (ref 26.0–34.0)
MCHC: 31.1 g/dL (ref 30.0–36.0)
MCHC: 32.5 g/dL (ref 30.0–36.0)
MCV: 92.5 fL (ref 80.0–100.0)
MCV: 88.8 fL (ref 80.0–100.0)
Platelets: 151 10*3/uL (ref 150–400)
Platelets: 195 10*3/uL (ref 150–400)
RBC: 3.2 MIL/uL — ABNORMAL LOW (ref 3.87–5.11)
RBC: 3.12 MIL/uL — ABNORMAL LOW (ref 3.87–5.11)
RDW: 17.7 % — ABNORMAL HIGH (ref 11.5–15.5)
RDW: 17.6 % — ABNORMAL HIGH (ref 11.5–15.5)
WBC: 10.9 10*3/uL — ABNORMAL HIGH (ref 4.0–10.5)
WBC: 10.1 10*3/uL (ref 4.0–10.5)
nRBC: 0 % (ref 0.0–0.2)
nRBC: 0 % (ref 0.0–0.2)

## 2020-06-12 LAB — GLUCOSE, CAPILLARY
Glucose-Capillary: 106 mg/dL — ABNORMAL HIGH (ref 70–99)
Glucose-Capillary: 124 mg/dL — ABNORMAL HIGH (ref 70–99)
Glucose-Capillary: 40 mg/dL — CL (ref 70–99)
Glucose-Capillary: 56 mg/dL — ABNORMAL LOW (ref 70–99)
Glucose-Capillary: 69 mg/dL — ABNORMAL LOW (ref 70–99)
Glucose-Capillary: 72 mg/dL (ref 70–99)
Glucose-Capillary: 91 mg/dL (ref 70–99)
Glucose-Capillary: 96 mg/dL (ref 70–99)

## 2020-06-12 LAB — GENTAMICIN LEVEL, RANDOM: Gentamicin Rm: 3 ug/mL

## 2020-06-12 LAB — CULTURE, BLOOD (ROUTINE X 2)
Culture: NO GROWTH
Culture: NO GROWTH

## 2020-06-12 LAB — MAGNESIUM: Magnesium: 1.7 mg/dL (ref 1.7–2.4)

## 2020-06-12 MED ORDER — DEXTROSE 50 % IV SOLN
INTRAVENOUS | Status: AC
  Administered 2020-06-12: 50 mL
  Filled 2020-06-12: qty 50

## 2020-06-12 MED ORDER — GENTAMICIN IN SALINE 1.6-0.9 MG/ML-% IV SOLN
80.0000 mg | INTRAVENOUS | Status: DC
  Filled 2020-06-12: qty 50

## 2020-06-12 MED ORDER — GENTAMICIN SULFATE 40 MG/ML IJ SOLN
60.0000 mg | Freq: Once | INTRAVENOUS | Status: AC
  Administered 2020-06-12: 60 mg via INTRAVENOUS
  Filled 2020-06-12: qty 1.5

## 2020-06-12 MED ORDER — CHLORHEXIDINE GLUCONATE 0.12% ORAL RINSE (MEDLINE KIT)
15.0000 mL | Freq: Two times a day (BID) | OROMUCOSAL | Status: DC
  Administered 2020-06-12 – 2020-06-25 (×15): 15 mL via OROMUCOSAL

## 2020-06-12 MED ORDER — DEXTROSE 50 % IV SOLN
INTRAVENOUS | Status: AC
  Filled 2020-06-12: qty 50

## 2020-06-12 MED ORDER — SODIUM PHOSPHATES 45 MMOLE/15ML IV SOLN
20.0000 mmol | Freq: Once | INTRAVENOUS | Status: AC
  Administered 2020-06-12: 20 mmol via INTRAVENOUS
  Filled 2020-06-12: qty 6.67

## 2020-06-12 MED ORDER — DEXTROSE 50 % IV SOLN
12.5000 g | Freq: Once | INTRAVENOUS | Status: AC
  Administered 2020-06-12: 12.5 g via INTRAVENOUS

## 2020-06-12 MED ORDER — ORAL CARE MOUTH RINSE
15.0000 mL | OROMUCOSAL | Status: DC
  Administered 2020-06-12 – 2020-06-17 (×43): 15 mL via OROMUCOSAL

## 2020-06-12 MED ORDER — GENTAMICIN IN SALINE 1.2-0.9 MG/ML-% IV SOLN
60.0000 mg | Freq: Once | INTRAVENOUS | Status: DC
  Filled 2020-06-12: qty 50

## 2020-06-12 MED ORDER — DEXTROSE 50 % IV SOLN: 1.0000 | Freq: Once | INTRAVENOUS | Status: DC

## 2020-06-12 MED ORDER — EPOETIN ALFA 10000 UNIT/ML IJ SOLN
10000.0000 [IU] | INTRAMUSCULAR | Status: DC
  Administered 2020-06-14: 10000 [IU] via INTRAVENOUS
  Filled 2020-06-12: qty 1

## 2020-06-12 MED ORDER — FAMOTIDINE IN NACL 20-0.9 MG/50ML-% IV SOLN
20.0000 mg | INTRAVENOUS | Status: DC
  Administered 2020-06-13 – 2020-06-16 (×4): 20 mg via INTRAVENOUS
  Filled 2020-06-12 (×4): qty 50

## 2020-06-12 NOTE — Progress Notes (Signed)
[facility]  ROUNDING NOTE   Subjective:   Afebrile overnight.    Objective:  Vital signs in last 24 hours:  Temp:  [97.9 F (36.6 C)-99 F (37.2 C)] 98.7 F (37.1 C) (09/21 0400) Pulse Rate:  [38-69] 54 (09/21 0700) Resp:  [13-26] 18 (09/21 0700) BP: (133-179)/(45-106) 153/63 (09/21 0700) SpO2:  [96 %-100 %] 100 % (09/21 0700) FiO2 (%):  [24 %] 24 % (09/21 0749) Weight:  [81.4 kg-86.4 kg] 81.4 kg (09/21 0432)  Weight change:  Filed Weights   06/11/20 0845 06/11/20 1039 06/12/20 0432  Weight: 86.4 kg 82.6 kg 81.4 kg    Intake/Output: I/O last 3 completed shifts: In: 933.5 [P.O.:120; I.V.:463.5; IV Piggyback:350] Out: 1500 [Other:1500]   Intake/Output this shift:  No intake/output data recorded.  Physical Exam: General: Critically ill  Head: ETT, no OGT  Eyes: Anicteric  Neck: trachea midline  Lungs:  PRVC fIO2 24%  Heart: bradycardia  Abdomen:  Soft,  Extremities:  trace peripheral edema.  Neurologic: Intubated and sedated  Skin: No lesions  Access: Right Hero graft, tender and erythematous swollen area. Right femoral temporary HD catheter     Basic Metabolic Panel: Recent Labs  Lab 06/06/20 0612 06/06/20 0612 06/08/20 1722 06/10/20 2119 06/10/20 2119 06/11/20 0648 06/11/20 1431 06/12/20 0425  NA 131*  --   --  135  --  130* 134* 134*  K 3.7  --   --  2.9*  --  3.3* 4.2 3.4*  CL 93*  --   --  96*  --  94* 96* 97*  CO2 27  --   --  27  --  _0 GLUCOSE 239*  --   --  98  --  115* 121* 91  BUN 18  --   --  12  --  _1 CREATININE 5.03*  --   --  4.04*  --  4.87* 5.28* 5.53*  CALCIUM 7.8*   < >  --  7.8*   < > 7.9* 8.3* 8.3*  MG  --   --   --   --   --   --   --  1.7  PHOS  --   --  1.9* 1.5*  --   --   --  1.9*   < > = values in this interval not displayed.    Liver Function Tests: Recent Labs  Lab 06/10/20 2119 06/12/20 0425  ALBUMIN 2.7* 2.8*   No results for input(s): LIPASE, AMYLASE in the last 168 hours. No  results for input(s): AMMONIA in the last 168 hours.  CBC: Recent Labs  Lab 06/05/20 1325 06/06/20 0612 06/10/20 2119 06/12/20 0123 06/12/20 0425  WBC 8.3 5.7 4.6 10.9* 10.1  HGB 8.2* 8.2* 8.4* 9.2* 9.0*  HCT 27.0* 25.2* 25.3* 29.6* 27.7*  MCV 93.8 89.7 86.6 92.5 88.8  PLT 77* 78* 187 151 195    Cardiac Enzymes: No results for input(s): CKTOTAL, CKMB, CKMBINDEX, TROPONINI in the last 168 hours.  BNP: Invalid input(s): POCBNP  CBG: Recent Labs  Lab 06/11/20 1927 06/12/20 0019 06/12/20 0056 06/12/20 0326 06/12/20 0725  GLUCAP 87 69* 124* 96 75*    Microbiology: Results for orders placed or performed during the hospital encounter of 06/02/20  SARS Coronavirus 2 by RT PCR (hospital order, performed in [facility] hospital lab) Nasopharyngeal Nasopharyngeal Swab     Status: None   Collection Time: 06/03/20  6:49 AM   Specimen: Nasopharyngeal Swab  Result Value Ref Range Status   SARS Coronavirus 2 NEGATIVE NEGATIVE Final    Comment: (NOTE) SARS-CoV-2 target nucleic acids are NOT DETECTED.  The SARS-CoV-2 RNA is generally detectable in upper and lower respiratory specimens during the acute phase of infection. The lowest concentration of SARS-CoV-2 viral copies this assay can detect is 250 copies / mL. A negative result does not preclude SARS-CoV-2 infection and should not be used as the sole basis for treatment or other patient management decisions.  A negative result may occur with improper specimen collection / handling, submission of specimen other than nasopharyngeal swab, presence of viral mutation(s) within the areas targeted by this assay, and inadequate number of viral copies (<250 copies / mL). A negative result must be combined with clinical observations, patient history, and epidemiological information.  Fact Sheet for Patients:   StrictlyIdeas.no  Fact Sheet for Healthcare  Providers: BankingDealers.co.za  This test is not yet approved or  cleared by the [place] FDA and has been authorized for detection and/or diagnosis of SARS-CoV-2 by FDA under an Emergency Use Authorization (EUA).  This EUA will remain in effect (meaning this test can be used) for the duration of the COVID-19 declaration under Section 564(b)(1) of the Act, 21 U.S.C. section 360bbb-3(b)(1), unless the authorization is terminated or revoked sooner.  Performed at [facility], [address] [place], [place] [address]   Gastrointestinal Panel by PCR , Stool     Status: None   Collection Time: 06/04/20  7:39 PM   Specimen: Stool  Result Value Ref Range Status   Campylobacter species NOT DETECTED NOT DETECTED Final   Plesimonas shigelloides NOT DETECTED NOT DETECTED Final   Salmonella species NOT DETECTED NOT DETECTED Final   Yersinia enterocolitica NOT DETECTED NOT DETECTED Final   Vibrio species NOT DETECTED NOT DETECTED Final   Vibrio cholerae NOT DETECTED NOT DETECTED Final   Enteroaggregative E coli (EAEC) NOT DETECTED NOT DETECTED Final   Enteropathogenic E coli (EPEC) NOT DETECTED NOT DETECTED Final   Enterotoxigenic E coli (ETEC) NOT DETECTED NOT DETECTED Final   Shiga like toxin producing E coli (STEC) NOT DETECTED NOT DETECTED Final   Shigella/Enteroinvasive E coli (EIEC) NOT DETECTED NOT DETECTED Final   Cryptosporidium NOT DETECTED NOT DETECTED Final   Cyclospora cayetanensis NOT DETECTED NOT DETECTED Final   Entamoeba histolytica NOT DETECTED NOT DETECTED Final   Giardia lamblia NOT DETECTED NOT DETECTED Final   Adenovirus F40/41 NOT DETECTED NOT DETECTED Final   Astrovirus NOT DETECTED NOT DETECTED Final   Norovirus GI/GII NOT DETECTED NOT DETECTED Final   Rotavirus A NOT DETECTED NOT DETECTED Final   Sapovirus (I, II, IV, and V) NOT DETECTED NOT DETECTED Final    Comment: Performed at [facility], [address]  [place], [place] [address]  Culture, blood (Routine X 2) w Reflex to ID Panel     Status: Abnormal   Collection Time: 06/04/20  8:49 PM   Specimen: BLOOD  Result Value Ref Range Status   Specimen Description   Final    BLOOD BLOOD LEFT FOREARM Performed at [facility], [address], [place], [place] [address]    Special Requests   Final    BOTTLES DRAWN AEROBIC AND ANAEROBIC Blood Culture adequate volume Performed at [facility], [address] [place], [place] [address]    Culture  Setup Time   Final    IN BOTH AEROBIC AND ANAEROBIC BOTTLES GRAM POSITIVE COCCI Organism ID to  follow CRITICAL RESULT CALLED TO, READ BACK BY AND VERIFIED WITH: CHARLES SHANLEVER _0  06/06/20 MJU LISA KLUTTZ AT 3474 ON 06/07/2020 [place]. Performed at [facility], [place] [address], [place], [place] [address]    Culture ENTEROCOCCUS FAECALIS (A)  Final   Report Status 06/08/2020 FINAL  Final   Organism ID, Bacteria ENTEROCOCCUS FAECALIS  Final      Susceptibility   Enterococcus faecalis - MIC*    AMPICILLIN <=2 SENSITIVE Sensitive     VANCOMYCIN 1 SENSITIVE Sensitive     GENTAMICIN SYNERGY SENSITIVE Sensitive     * ENTEROCOCCUS FAECALIS  Blood Culture ID Panel (Reflexed)     Status: Abnormal   Collection Time: 06/04/20  8:49 PM  Result Value Ref Range Status   Enterococcus faecalis DETECTED (A) NOT DETECTED Final    Comment: CRITICAL RESULT CALLED TO, READ BACK BY AND VERIFIED WITH: CHARLES SHANLEVER _1  06/06/20 MJU    Enterococcus Faecium NOT DETECTED NOT DETECTED Final   Listeria monocytogenes NOT DETECTED NOT DETECTED Final   Staphylococcus species NOT DETECTED NOT DETECTED Final   Staphylococcus aureus (BCID) NOT DETECTED NOT DETECTED Final   Staphylococcus epidermidis NOT DETECTED NOT DETECTED Final   Staphylococcus lugdunensis NOT DETECTED NOT DETECTED Final   Streptococcus species NOT DETECTED NOT DETECTED Final   Streptococcus agalactiae NOT DETECTED NOT DETECTED  Final   Streptococcus pneumoniae NOT DETECTED NOT DETECTED Final   Streptococcus pyogenes NOT DETECTED NOT DETECTED Final   A.calcoaceticus-baumannii NOT DETECTED NOT DETECTED Final   Bacteroides fragilis NOT DETECTED NOT DETECTED Final   Enterobacterales NOT DETECTED NOT DETECTED Final   Enterobacter cloacae complex NOT DETECTED NOT DETECTED Final   Escherichia coli NOT DETECTED NOT DETECTED Final   Klebsiella aerogenes NOT DETECTED NOT DETECTED Final   Klebsiella oxytoca NOT DETECTED NOT DETECTED Final   Klebsiella pneumoniae NOT DETECTED NOT DETECTED Final   Proteus species NOT DETECTED NOT DETECTED Final   Salmonella species NOT DETECTED NOT DETECTED Final   Serratia marcescens NOT DETECTED NOT DETECTED Final   Haemophilus influenzae NOT DETECTED NOT DETECTED Final   Neisseria meningitidis NOT DETECTED NOT DETECTED Final   Pseudomonas aeruginosa NOT DETECTED NOT DETECTED Final   Stenotrophomonas maltophilia NOT DETECTED NOT DETECTED Final   Candida albicans NOT DETECTED NOT DETECTED Final   Candida auris NOT DETECTED NOT DETECTED Final   Candida glabrata NOT DETECTED NOT DETECTED Final   Candida krusei NOT DETECTED NOT DETECTED Final   Candida parapsilosis NOT DETECTED NOT DETECTED Final   Candida tropicalis NOT DETECTED NOT DETECTED Final   Cryptococcus neoformans/gattii NOT DETECTED NOT DETECTED Final   Vancomycin resistance NOT DETECTED NOT DETECTED Final    Comment: Performed at [facility], [address] [place], [place] [address]  Culture, blood (Routine X 2) w Reflex to ID Panel     Status: Abnormal   Collection Time: 06/05/20  1:25 PM   Specimen: BLOOD  Result Value Ref Range Status   Specimen Description   Final    BLOOD LAC Performed at [facility], [address], [place], [place] [address]    Special Requests   Final    BOTTLES DRAWN AEROBIC AND ANAEROBIC Blood Culture adequate volume Performed at [facility], [address] [place], [place] [address]    Culture  Setup Time   Final    Organism ID to follow ANAEROBIC BOTTLE ONLY CRITICAL RESULT CALLED TO, READ BACK BY AND VERIFIED WITH: JASON ROBBINS 06/07/20 AT 1506  BY ACR GRAM POSITIVE COCCI PREVIOUSLY REPORTED AS: GRAM NEGATIVE COCCOBACILLI CORRECTED RESULTS CALLED TO: D,Ziegler PHARMD, at [place] 06/07/20 BY E. BENTON    Culture (A)  Final    ENTEROCOCCUS FAECALIS SUSCEPTIBILITIES PERFORMED ON PREVIOUS CULTURE WITHIN THE LAST 5 DAYS. Performed at [facility], [place] [address], [place], [place] [address]    Report Status 06/08/2020 FINAL  Final  Blood Culture ID Panel (Reflexed)     Status: Abnormal   Collection Time: 06/05/20  1:25 PM  Result Value Ref Range Status   Enterococcus faecalis DETECTED (A) NOT DETECTED Final    Comment: CRITICAL RESULT CALLED TO, READ BACK BY AND VERIFIED WITH: JASON ROBBINS 06/07/20 AT 1506 BY ACR    Enterococcus Faecium NOT DETECTED NOT DETECTED Final   Listeria monocytogenes NOT DETECTED NOT DETECTED Final   Staphylococcus species NOT DETECTED NOT DETECTED Final   Staphylococcus aureus (BCID) NOT DETECTED NOT DETECTED Final   Staphylococcus epidermidis NOT DETECTED NOT DETECTED Final   Staphylococcus lugdunensis NOT DETECTED NOT DETECTED Final   Streptococcus species NOT DETECTED NOT DETECTED Final   Streptococcus agalactiae NOT DETECTED NOT DETECTED Final   Streptococcus pneumoniae NOT DETECTED NOT DETECTED Final   Streptococcus pyogenes NOT DETECTED NOT DETECTED Final   A.calcoaceticus-baumannii NOT DETECTED NOT DETECTED Final   Bacteroides fragilis NOT DETECTED NOT DETECTED Final   Enterobacterales NOT DETECTED NOT DETECTED Final   Enterobacter cloacae complex NOT DETECTED NOT DETECTED Final   Escherichia coli NOT DETECTED NOT DETECTED Final   Klebsiella aerogenes NOT DETECTED NOT DETECTED Final   Klebsiella oxytoca NOT DETECTED NOT DETECTED Final   Klebsiella pneumoniae NOT DETECTED NOT DETECTED Final    Proteus species NOT DETECTED NOT DETECTED Final   Salmonella species NOT DETECTED NOT DETECTED Final   Serratia marcescens NOT DETECTED NOT DETECTED Final   Haemophilus influenzae NOT DETECTED NOT DETECTED Final   Neisseria meningitidis NOT DETECTED NOT DETECTED Final   Pseudomonas aeruginosa NOT DETECTED NOT DETECTED Final   Stenotrophomonas maltophilia NOT DETECTED NOT DETECTED Final   Candida albicans NOT DETECTED NOT DETECTED Final   Candida auris NOT DETECTED NOT DETECTED Final   Candida glabrata NOT DETECTED NOT DETECTED Final   Candida krusei NOT DETECTED NOT DETECTED Final   Candida parapsilosis NOT DETECTED NOT DETECTED Final   Candida tropicalis NOT DETECTED NOT DETECTED Final   Cryptococcus neoformans/gattii NOT DETECTED NOT DETECTED Final   Vancomycin resistance NOT DETECTED NOT DETECTED Final    Comment: Performed at [facility], [address] [place], [place] [address]  CULTURE, BLOOD (ROUTINE X 2) w Reflex to ID Panel     Status: None   Collection Time: 06/07/20  7:02 PM   Specimen: BLOOD  Result Value Ref Range Status   Specimen Description BLOOD BLOOD LEFT FOREARM  Final   Special Requests   Final    BOTTLES DRAWN AEROBIC AND ANAEROBIC Blood Culture results may not be optimal due to an inadequate volume of blood received in culture bottles   Culture   Final    NO GROWTH 5 DAYS Performed at [facility], [address] [place], [place] [address]    Report Status 06/12/2020 FINAL  Final  CULTURE, BLOOD (ROUTINE X 2) w Reflex to ID Panel     Status: None   Collection Time: 06/07/20  8:46 PM   Specimen: BLOOD  Result Value Ref Range Status   Specimen Description BLOOD BLOOD LEFT HAND  Final   Special Requests  Final    BOTTLES DRAWN AEROBIC ONLY Blood Culture results may not be optimal due to an inadequate volume of blood received in culture bottles   Culture   Final    NO GROWTH 5 DAYS Performed at [facility], [address] [place], [place] [address]    Report Status 06/12/2020 FINAL  Final  MRSA PCR Screening     Status: None   Collection Time: 06/11/20 10:40 AM   Specimen: Nasopharyngeal  Result Value Ref Range Status   MRSA by PCR NEGATIVE NEGATIVE Final    Comment:        The GeneXpert MRSA Assay (FDA approved for NASAL specimens only), is one component of a comprehensive MRSA colonization surveillance program. It is not intended to diagnose MRSA infection nor to guide or monitor treatment for MRSA infections. Performed at [facility], [address] [place],  [address]     Coagulation Studies: No results for input(s): LABPROT, INR in the last 72 hours.  Urinalysis: No results for input(s): COLORURINE, LABSPEC, PHURINE, GLUCOSEU, HGBUR, BILIRUBINUR, KETONESUR, PROTEINUR, UROBILINOGEN, NITRITE, LEUKOCYTESUR in the last 72 hours.  Invalid input(s): APPERANCEUR    Imaging: CT HEAD WO CONTRAST  Result Date: 06/11/2020 CLINICAL DATA:  Hypotension during PE.  History of dialysis EXAM: CT HEAD WITHOUT CONTRAST TECHNIQUE: Contiguous axial images were obtained from the base of the skull through the vertex without intravenous contrast. COMPARISON:  Six days ago FINDINGS: Brain: No evidence of acute infarction, hemorrhage, hydrocephalus, extra-axial collection or mass lesion/mass effect. Vascular: Atherosclerotic calcification Skull: Generalized sclerotic appearance of the bones, likely renal. Sinuses/Orbits: Bilateral cataract resection.  No acute finding IMPRESSION: No acute finding. Electronically Signed   By: Monte Fantasia M.D.   On: 06/11/2020 10:36   CT CHEST WO CONTRAST  Result Date: 06/11/2020 CLINICAL DATA:  Hypotensive following transesophageal echocardiogram. EXAM: CT CHEST WITHOUT CONTRAST TECHNIQUE: Multidetector CT imaging of the chest was performed following the standard protocol without IV contrast. COMPARISON:  CT scan 05/22/2020 FINDINGS: Cardiovascular: The heart is  mildly enlarged but stable. Stable tortuosity and calcification of the thoracic aorta. Stable three-vessel coronary artery calcifications. Right IJ Vas-Cath in good position without complicating features. Left brachiocephalic vein stent is stable. Mediastinum/Nodes: Stable scattered mediastinal and hilar lymph nodes. The esophagus is grossly normal. Lungs/Pleura: Persistent vascular congestion without overt pulmonary edema. There are small bilateral pleural effusions and lower lobe interstitial changes which have improved. Mild lower lobe peribronchial thickening. No infiltrates or pneumothorax. Upper Abdomen: No significant upper abdominal findings. Stable advanced vascular calcifications. Bilateral renal atrophy. Musculoskeletal: No significant bony findings. IMPRESSION: 1. Persistent vascular congestion without overt pulmonary edema. 2. Small bilateral pleural effusions and lower lobe interstitial changes which have improved. 3. No infiltrates or pneumothorax. 4. Stable advanced atherosclerotic calcifications involving the thoracic and abdominal aorta and branch vessels including the coronary arteries. 5. Aortic atherosclerosis. Aortic Atherosclerosis (ICD10-I70.0). Electronically Signed   By: Marijo Sanes M.D.   On: 06/11/2020 10:46   DG Chest Port 1 View  Result Date: 06/12/2020 CLINICAL DATA:  Acute respiratory failure EXAM: PORTABLE CHEST 1 VIEW COMPARISON:  None. FINDINGS: Endotracheal tube is been placed with its tip 5.1 cm above the carina. Right internal jugular HeRO graft venous limb is seen with its tip within the right atrium. Left upper extremity central venous stenting has been performed. Minimal left basilar atelectasis. Lungs are otherwise clear. No pneumothorax or pleural effusion. Cardiac size within normal limits. Pulmonary vascularity is normal. IMPRESSION: No active  disease. Electronically Signed   By: Fidela Salisbury MD   On: 06/12/2020 05:18   ECHO TEE  Result Date: 06/11/2020     TRANSESOPHOGEAL ECHO REPORT   Patient Name:   [name] Date of Exam: 06/11/2020 Medical Rec #:  [id number]   Height:       65.0 in Accession #:    [id number]  Weight:       190.5 lb Date of Birth:  1961/08/19    BSA:          1.938 m Patient Age:    59 years    BP:           147/52 mmHg Patient Gender: F           HR:           56 bpm. Exam Location:  [facility] Procedure: Transesophageal Echo, Cardiac Doppler and Color Doppler Indications:     Bacteremia 790.7  History:         Patient has prior history of Echocardiogram examinations, most                  recent 06/07/2020. COPD, Signs/Symptoms:Dyspnea; Risk                  Factors:Hypertension.  Sonographer:     Sherrie Sport RDCS (AE) Referring Phys:  [id number] Teodoro Spray Diagnosing Phys: Bartholome Bill MD PROCEDURE: The transesophogeal probe was passed without difficulty through the esophogus of the patient. Sedation performed by different physician. The patient was monitored while under deep sedation. Image quality was excellent. The patient developed Respiratory depression and hypotension and bradycardia during the procedure. IMPRESSIONS  1. Left ventricular ejection fraction, by estimation, is 50 to 55%. The left ventricle has normal function.  2. Right ventricular systolic function is normal. The right ventricular size is normal.  3. Left atrial size was mildly dilated. No left atrial/left atrial appendage thrombus was detected.  4. Right atrial size was mildly dilated.  5. The mitral valve is grossly normal. Mild to moderate mitral valve regurgitation.  6. Tricuspid valve regurgitation is mild to moderate.  7. The aortic valve is tricuspid. Aortic valve regurgitation is not visualized. FINDINGS  Left Ventricle: Left ventricular ejection fraction, by estimation, is 50 to 55%. The left ventricle has normal function. The left ventricular internal cavity size was normal in size. Right Ventricle: The right ventricular size is normal. No increase in right ventricular wall  thickness. Right ventricular systolic function is normal. Left Atrium: Left atrial size was mildly dilated. No left atrial/left atrial appendage thrombus was detected. Right Atrium: Right atrial size was mildly dilated. Pericardium: There is no evidence of pericardial effusion. Mitral Valve: The mitral valve is grossly normal. Mild to moderate mitral valve regurgitation. There is no evidence of mitral valve vegetation. Tricuspid Valve: The tricuspid valve is grossly normal. Tricuspid valve regurgitation is mild to moderate. There is no evidence of tricuspid valve vegetation. Aortic Valve: The aortic valve is tricuspid. Aortic valve regurgitation is not visualized. There is no evidence of aortic valve vegetation. Pulmonic Valve: The pulmonic valve was not well visualized. Pulmonic valve regurgitation is not visualized. Aorta: The aortic root is normal in size and structure. IAS/Shunts: The interatrial septum was not assessed. Bartholome Bill MD Electronically signed by Bartholome Bill MD Signature Date/Time: 06/11/2020/12:41:00 PM    Final      Medications:   . ampicillin (OMNIPEN) IV 2 g (06/11/20 2220)  . famotidine (PEPCID) IV 20  mg (06/12/20 0021)  . gentamicin    . propofol (DIPRIVAN) infusion 22 mcg/kg/min (06/12/20 0306)   . aspirin EC  81 mg Oral Daily  . atorvastatin  20 mg Oral Daily  . carvedilol  6.25 mg Oral BID WC  . chlorhexidine gluconate (MEDLINE KIT)  15 mL Mouth Rinse BID  . Chlorhexidine Gluconate Cloth  6 each Topical Daily  . cholestyramine light  4 g Oral BID  . epoetin (EPOGEN/PROCRIT) injection  10,000 Units Intravenous Q M,W,F-HD  . ferrous sulfate  325 mg Oral Daily  . furosemide  80 mg Oral Daily  . gabapentin  100 mg Oral QPM  . heparin  5,000 Units Subcutaneous Q8H  . hydrOXYzine  25 mg Oral Daily  . lipase/protease/amylase  12,000 Units Oral TID WC  . mouth rinse  15 mL Mouth Rinse 10 times per day  . multivitamin  1 tablet Oral Daily  . sevelamer carbonate  800 mg  Oral TID WC  . cyanocobalamin  1,000 mcg Oral Daily   acetaminophen **OR** acetaminophen, calcium carbonate, camphor-menthol **AND** hydrOXYzine, feeding supplement (NEPRO CARB STEADY), hydrALAZINE, ipratropium-albuterol, loperamide, morphine injection, naLOXone (NARCAN)  injection, ondansetron **OR** ondansetron (ZOFRAN) IV, oxyCODONE-acetaminophen, promethazine, sorbitol, zolpidem  Assessment/ Plan:  [name] is a 59 y.o. black female with ESRD on HD MWF, hypertension, COPD, diastolic heart failure, depression, diabetes mellitus type 2, hyperlipidemia, with new dialysis access placement 04/12/2020 who is   [facility] MWF [facility] 81kg.left AVG  1. ESRD on HD MWF  Hemodialysis treatment Sunday.   - Patient had right femoral temp dialysis catheter placed 9/18 - Dialysis for later today. Orders prepared.   2. Anemia of chronic kidney disease.hemoglobin 9 Continue epogen 10000 units IV with HD.  3. Secondary hyperparathyroidism. Sevelamer when able to take enteral nutrition.   4. Enterococcal bacetermia: On ampicillin and gentamicin.  - source may be from HERO graft  - appreciate vascular, ID and critical care input.   5. Hypertension:  - PRN hydralazine.  - discontinue carvedilol   LOS: 9 Sarath Kolluru 9/21/20218:28 AM

## 2020-06-12 NOTE — Progress Notes (Signed)
Pt stable vitals stable at this time for HD right Fem DC WNL NSSI ufg 1L

## 2020-06-12 NOTE — Consult Note (Signed)
Pharmacy Antibiotic Note  [name] is a 59 y.o. female admitted on 06/02/2020 with respiratory failure secondary to acute diastolic CHF and COPD exacerbation. PMH includes ESRD on HD, HTN, DM, CAD, PCD.  Pharmacy has been consulted for ampicillin and gentamicin dosing.   Patient with ESRD on HD with numerous graft complications. Erythemous firm swelling in right cubital area of graft arm.  Graft site noted as possible source of bloodstream infection, removed 9/19.  Gentamicin level obtained 9/21 at 0425 and resulted as 3 (goal peak: 3-4 mg/L, goal trough <1) s/p LD of 100mg  9/17 at 1800 and MD of 80 mg 9/20 at 0026 w/ one 2 hour dialysis session on 9/19 with BFR 250 (max: 400). Full dialysis session planned for today  TTE 9/16: moderate tricuspid valve regurgitation, no vegetation visualized  TEE 9/20: mild to moderate tricuspid valve regurgitation, no vegetation visualized. Procedure complicated by hypotension, respiratory depression, and bradycardia.    Cultures this admission:  BCx 9/13: Enterococcus faecalis, amp sensitive, gent synergy sensitive BCx 9/14: Enterococcus faecalis  BCID 9/14: Enterococcus faecalis detected  BCx 9/16: NGTD in 4/4 bottles  Plan:  Administer maintenance dose of gentamicin 60mg  IV following full dialysis session.   Continue gentamicin 60mg  following full dialysis sessions.   Monitor gentamicin levels weekly. Monitor signs of ototoxicity (I.e. ringing in ears daily during therapy)     Continue ampicillin 2g IV every 12 hours   Height: 5\' 5"  (165.1 cm) Weight: 81.4 kg (179 lb 7.3 oz) IBW/kg (Calculated) : 57  Temp (24hrs), Avg:98.5 F (36.9 C), Min:97.9 F (36.6 C), Max:99 F (37.2 C)  Recent Labs  Lab 06/05/20 1325 06/05/20 1325 06/06/20 0612 06/10/20 2119 06/11/20 0648 06/11/20 1431 06/12/20 0123 06/12/20 0425  WBC 8.3  --  5.7 4.6  --   --  10.9* 10.1  CREATININE 4.30*   < > 5.03* 4.04* 4.87* 5.28*  --  5.53*  GENTRANDOM  --   --    --   --   --   --   --  3.0   < > = values in this interval not displayed.    Estimated Creatinine Clearance: 11.3 mL/min (A) (by C-G formula based on SCr of 5.53 mg/dL (H)).    Allergies  Allergen Reactions  . Ace Inhibitors Swelling and Anaphylaxis  . Ativan [Lorazepam] Other (See Comments)    Reaction:Hallucinations and headaches  . Compazine [Prochlorperazine Edisylate] Anaphylaxis, Nausea And Vomiting and Other (See Comments)    Other reaction(s): dystonia from this vs. Reglan, 23 Jul - patient relates that she takes promethazine frequently with no problems  . Sumatriptan Succinate Other (See Comments)    Other reaction(s): delirium and hallucinations per [facility] records  . Zofran [Ondansetron] Nausea And Vomiting    Per pt. she is allergic to zofran or will experience adverse reaction like hallucination   . Lac Bovis Nausea And Vomiting  . Losartan Nausea Only  . Prochlorperazine Other (See Comments)    Reaction:  Unknown . Patient does not remember reaction but she does have vertigo and anxiety along with n and v at times. Could be used to treat any of these   . Reglan [Metoclopramide] Other (See Comments)    Per patient her Dr. Evelina Bucy her off it   . Scopolamine Other (See Comments)    Dizziness, also has vertigo already  . Tape Rash    Plastic tape causes rash  . Tapentadol Rash    Antimicrobials this admission: Azithromycin 9/12 >>  9/15 Ceftriaxone 9/12 >> 9/15  Zosyn 9/15 >> 9/17  Ampicillin 9/17 >>  Gentamicin 9/17 >>     Thank you for allowing pharmacy to be a part of this patient's care.  [name] 06/12/2020 9:59 AM

## 2020-06-12 NOTE — Anesthesia Postprocedure Evaluation (Signed)
Anesthesia Post Note  Patient: [name]  Procedure(s) Performed: TRANSESOPHAGEAL ECHOCARDIOGRAM (TEE) (N/A )  Patient location during evaluation: ICU Anesthesia Type: General Level of consciousness: sedated and patient remains intubated per anesthesia plan Pain management: pain level controlled Vital Signs Assessment: post-procedure vital signs reviewed and stable Respiratory status: patient remains intubated per anesthesia plan and patient on ventilator - see flowsheet for VS Cardiovascular status: stable Postop Assessment: no apparent nausea or vomiting Anesthetic complications: no   No complications documented.   Last Vitals:  Vitals:   06/12/20 0600 06/12/20 0700  BP: (!) 148/61 (!) 153/63  Pulse: (!) 56 (!) 54  Resp: 18 18  Temp:    SpO2: 100% 100%    Last Pain:  Vitals:   06/12/20 0400  TempSrc: Oral  PainSc:                  [name]

## 2020-06-12 NOTE — Progress Notes (Signed)
CRITICAL CARE NOTE 59 y.o.femalewith medical history significant ofend-stage renal disease on hemodialysis Mondays Wednesdays and Fridays, hypertension, COPD, diastolic dysfunction CHF, depression, diabetes, hyperlipidemia, who apparently missed her dialysis on Friday.    9/20 TEE performed Tee revealed no evidence of endocartidits. Procedure complicated by hypotension and bradycaradia with heart rates dropping to mid 20's and blood pressure systolic to 41P. Received 1 amp of atropine, amp of epinephrine and neosynephrine. Pt intubated to protect air way. Stat head ct and chest ct were unremarkable. Pt son notified emmediately and has been updated with care. 9/21 remains on vent, plan for SAT/SBT  CC  follow up respiratory failure  SUBJECTIVE Patient remains critically ill Prognosis is guarded   BP (!) 153/63   Pulse (!) 54   Temp 98.7 F (37.1 C) (Oral)   Resp 18   Ht 5' 5"  (1.651 m)   Wt 81.4 kg   SpO2 100%   BMI 29.86 kg/m    I/O last 3 completed shifts: In: 933.5 [P.O.:120; I.V.:463.5; IV Piggyback:350] Out: 1500 [Other:1500] No intake/output data recorded.  SpO2: 100 % O2 Flow Rate (L/min):  (Intubated ) FiO2 (%): 24 %  Estimated body mass index is 29.86 kg/m as calculated from the following:   Height as of this encounter: 5' 5"  (1.651 m).   Weight as of this encounter: 81.4 kg.  SIGNIFICANT EVENTS   REVIEW OF SYSTEMS  PATIENT IS UNABLE TO PROVIDE COMPLETE REVIEW OF SYSTEMS DUE TO SEVERE CRITICAL ILLNESS        PHYSICAL EXAMINATION:  GENERAL:critically ill appearing, +resp distress HEAD: Normocephalic, atraumatic.  EYES: Pupils equal, round, reactive to light.  No scleral icterus.  MOUTH: Moist mucosal membrane. NECK: Supple.  PULMONARY: +rhonchi, +wheezing CARDIOVASCULAR: S1 and S2. Regular rate and rhythm. No murmurs, rubs, or gallops.  GASTROINTESTINAL: Soft, nontender, -distended.  Positive bowel sounds.   MUSCULOSKELETAL: No swelling,  clubbing, or edema.  NEUROLOGIC: obtunded, GCS<8 SKIN:intact,warm,dry  MEDICATIONS: I have reviewed all medications and confirmed regimen as documented   CULTURE RESULTS   Recent Results (from the past 240 hour(s))  SARS Coronavirus 2 by RT PCR (hospital order, performed in [facility] hospital lab) Nasopharyngeal Nasopharyngeal Swab     Status: None   Collection Time: 06/03/20  6:49 AM   Specimen: Nasopharyngeal Swab  Result Value Ref Range Status   SARS Coronavirus 2 NEGATIVE NEGATIVE Final    Comment: (NOTE) SARS-CoV-2 target nucleic acids are NOT DETECTED.  The SARS-CoV-2 RNA is generally detectable in upper and lower respiratory specimens during the acute phase of infection. The lowest concentration of SARS-CoV-2 viral copies this assay can detect is 250 copies / mL. A negative result does not preclude SARS-CoV-2 infection and should not be used as the sole basis for treatment or other patient management decisions.  A negative result may occur with improper specimen collection / handling, submission of specimen other than nasopharyngeal swab, presence of viral mutation(s) within the areas targeted by this assay, and inadequate number of viral copies (<250 copies / mL). A negative result must be combined with clinical observations, patient history, and epidemiological information.  Fact Sheet for Patients:   StrictlyIdeas.no  Fact Sheet for Healthcare Providers: BankingDealers.co.za  This test is not yet approved or  cleared by the [place] FDA and has been authorized for detection and/or diagnosis of SARS-CoV-2 by FDA under an Emergency Use Authorization (EUA).  This EUA will remain in effect (meaning this test can be used) for the duration of the COVID-19  declaration under Section 564(b)(1) of the Act, 21 U.S.C. section 360bbb-3(b)(1), unless the authorization is terminated or revoked sooner.  Performed at [facility], [address] [place], [place] [address]   Gastrointestinal Panel by PCR , Stool     Status: None   Collection Time: 06/04/20  7:39 PM   Specimen: Stool  Result Value Ref Range Status   Campylobacter species NOT DETECTED NOT DETECTED Final   Plesimonas shigelloides NOT DETECTED NOT DETECTED Final   Salmonella species NOT DETECTED NOT DETECTED Final   Yersinia enterocolitica NOT DETECTED NOT DETECTED Final   Vibrio species NOT DETECTED NOT DETECTED Final   Vibrio cholerae NOT DETECTED NOT DETECTED Final   Enteroaggregative E coli (EAEC) NOT DETECTED NOT DETECTED Final   Enteropathogenic E coli (EPEC) NOT DETECTED NOT DETECTED Final   Enterotoxigenic E coli (ETEC) NOT DETECTED NOT DETECTED Final   Shiga like toxin producing E coli (STEC) NOT DETECTED NOT DETECTED Final   Shigella/Enteroinvasive E coli (EIEC) NOT DETECTED NOT DETECTED Final   Cryptosporidium NOT DETECTED NOT DETECTED Final   Cyclospora cayetanensis NOT DETECTED NOT DETECTED Final   Entamoeba histolytica NOT DETECTED NOT DETECTED Final   Giardia lamblia NOT DETECTED NOT DETECTED Final   Adenovirus F40/41 NOT DETECTED NOT DETECTED Final   Astrovirus NOT DETECTED NOT DETECTED Final   Norovirus GI/GII NOT DETECTED NOT DETECTED Final   Rotavirus A NOT DETECTED NOT DETECTED Final   Sapovirus (I, II, IV, and V) NOT DETECTED NOT DETECTED Final    Comment: Performed at [facility], [address] [place], [place] [address]  Culture, blood (Routine X 2) w Reflex to ID Panel     Status: Abnormal   Collection Time: 06/04/20  8:49 PM   Specimen: BLOOD  Result Value Ref Range Status   Specimen Description   Final    BLOOD BLOOD LEFT FOREARM Performed at [facility], [address] [place], [place] [address]    Special Requests   Final    BOTTLES DRAWN AEROBIC AND ANAEROBIC Blood Culture adequate volume Performed at [facility], [address] [place], [place] [address]     Culture  Setup Time   Final    IN BOTH AEROBIC AND ANAEROBIC BOTTLES GRAM POSITIVE COCCI Organism ID to follow CRITICAL RESULT CALLED TO, READ BACK BY AND VERIFIED WITH: CHARLES SHANLEVER @1636  06/06/20 MJU LISA KLUTTZ AT 1019 ON 06/07/2020 [place]. Performed at [facility], [place] [address], [place], [place] [address]    Culture ENTEROCOCCUS FAECALIS (A)  Final   Report Status 06/08/2020 FINAL  Final   Organism ID, Bacteria ENTEROCOCCUS FAECALIS  Final      Susceptibility   Enterococcus faecalis - MIC*    AMPICILLIN <=2 SENSITIVE Sensitive     VANCOMYCIN 1 SENSITIVE Sensitive     GENTAMICIN SYNERGY SENSITIVE Sensitive     * ENTEROCOCCUS FAECALIS  Blood Culture ID Panel (Reflexed)     Status: Abnormal   Collection Time: 06/04/20  8:49 PM  Result Value Ref Range Status   Enterococcus faecalis DETECTED (A) NOT DETECTED Final    Comment: CRITICAL RESULT CALLED TO, READ BACK BY AND VERIFIED WITH: CHARLES SHANLEVER @1636  06/06/20 MJU    Enterococcus Faecium NOT DETECTED NOT DETECTED Final   Listeria monocytogenes NOT DETECTED NOT DETECTED Final   Staphylococcus species NOT DETECTED NOT DETECTED Final   Staphylococcus aureus (BCID) NOT DETECTED NOT DETECTED Final   Staphylococcus epidermidis NOT DETECTED NOT DETECTED Final  Staphylococcus lugdunensis NOT DETECTED NOT DETECTED Final   Streptococcus species NOT DETECTED NOT DETECTED Final   Streptococcus agalactiae NOT DETECTED NOT DETECTED Final   Streptococcus pneumoniae NOT DETECTED NOT DETECTED Final   Streptococcus pyogenes NOT DETECTED NOT DETECTED Final   A.calcoaceticus-baumannii NOT DETECTED NOT DETECTED Final   Bacteroides fragilis NOT DETECTED NOT DETECTED Final   Enterobacterales NOT DETECTED NOT DETECTED Final   Enterobacter cloacae complex NOT DETECTED NOT DETECTED Final   Escherichia coli NOT DETECTED NOT DETECTED Final   Klebsiella aerogenes NOT DETECTED NOT DETECTED Final   Klebsiella oxytoca NOT DETECTED NOT DETECTED  Final   Klebsiella pneumoniae NOT DETECTED NOT DETECTED Final   Proteus species NOT DETECTED NOT DETECTED Final   Salmonella species NOT DETECTED NOT DETECTED Final   Serratia marcescens NOT DETECTED NOT DETECTED Final   Haemophilus influenzae NOT DETECTED NOT DETECTED Final   Neisseria meningitidis NOT DETECTED NOT DETECTED Final   Pseudomonas aeruginosa NOT DETECTED NOT DETECTED Final   Stenotrophomonas maltophilia NOT DETECTED NOT DETECTED Final   Candida albicans NOT DETECTED NOT DETECTED Final   Candida auris NOT DETECTED NOT DETECTED Final   Candida glabrata NOT DETECTED NOT DETECTED Final   Candida krusei NOT DETECTED NOT DETECTED Final   Candida parapsilosis NOT DETECTED NOT DETECTED Final   Candida tropicalis NOT DETECTED NOT DETECTED Final   Cryptococcus neoformans/gattii NOT DETECTED NOT DETECTED Final   Vancomycin resistance NOT DETECTED NOT DETECTED Final    Comment: Performed at [facility], [address] [place], [place] [address]  Culture, blood (Routine X 2) w Reflex to ID Panel     Status: Abnormal   Collection Time: 06/05/20  1:25 PM   Specimen: BLOOD  Result Value Ref Range Status   Specimen Description   Final    BLOOD LAC Performed at [facility], [address] [place], [place] [address]    Special Requests   Final    BOTTLES DRAWN AEROBIC AND ANAEROBIC Blood Culture adequate volume Performed at [facility] [address] [place], [place] [address]    Culture  Setup Time   Final    Organism ID to follow ANAEROBIC BOTTLE ONLY CRITICAL RESULT CALLED TO, READ BACK BY AND VERIFIED WITH: JASON ROBBINS 06/07/20 AT 1506 BY ACR GRAM POSITIVE COCCI PREVIOUSLY REPORTED AS: GRAM NEGATIVE COCCOBACILLI CORRECTED RESULTS CALLED TO: D,Ziegler PHARMD, at [place] 06/07/20 BY E. BENTON    Culture (A)  Final    ENTEROCOCCUS FAECALIS SUSCEPTIBILITIES PERFORMED ON PREVIOUS CULTURE WITHIN THE LAST 5 DAYS. Performed at [facility], [place] [address], [place], [place] [address]    Report Status 06/08/2020 FINAL  Final  Blood Culture ID Panel (Reflexed)     Status: Abnormal   Collection Time: 06/05/20  1:25 PM  Result Value Ref Range Status   Enterococcus faecalis DETECTED (A) NOT DETECTED Final    Comment: CRITICAL RESULT CALLED TO, READ BACK BY AND VERIFIED WITH: JASON ROBBINS 06/07/20 AT 1506 BY ACR    Enterococcus Faecium NOT DETECTED NOT DETECTED Final   Listeria monocytogenes NOT DETECTED NOT DETECTED Final   Staphylococcus species NOT DETECTED NOT DETECTED Final   Staphylococcus aureus (BCID) NOT DETECTED NOT DETECTED Final   Staphylococcus epidermidis NOT DETECTED NOT DETECTED Final   Staphylococcus lugdunensis NOT DETECTED NOT DETECTED Final   Streptococcus species NOT DETECTED NOT DETECTED Final   Streptococcus agalactiae NOT DETECTED NOT DETECTED Final   Streptococcus pneumoniae NOT DETECTED NOT DETECTED Final  Streptococcus pyogenes NOT DETECTED NOT DETECTED Final   A.calcoaceticus-baumannii NOT DETECTED NOT DETECTED Final   Bacteroides fragilis NOT DETECTED NOT DETECTED Final   Enterobacterales NOT DETECTED NOT DETECTED Final   Enterobacter cloacae complex NOT DETECTED NOT DETECTED Final   Escherichia coli NOT DETECTED NOT DETECTED Final   Klebsiella aerogenes NOT DETECTED NOT DETECTED Final   Klebsiella oxytoca NOT DETECTED NOT DETECTED Final   Klebsiella pneumoniae NOT DETECTED NOT DETECTED Final   Proteus species NOT DETECTED NOT DETECTED Final   Salmonella species NOT DETECTED NOT DETECTED Final   Serratia marcescens NOT DETECTED NOT DETECTED Final   Haemophilus influenzae NOT DETECTED NOT DETECTED Final   Neisseria meningitidis NOT DETECTED NOT DETECTED Final   Pseudomonas aeruginosa NOT DETECTED NOT DETECTED Final   Stenotrophomonas maltophilia NOT DETECTED NOT DETECTED Final   Candida albicans NOT DETECTED NOT DETECTED Final   Candida auris NOT DETECTED NOT DETECTED Final   Candida glabrata  NOT DETECTED NOT DETECTED Final   Candida krusei NOT DETECTED NOT DETECTED Final   Candida parapsilosis NOT DETECTED NOT DETECTED Final   Candida tropicalis NOT DETECTED NOT DETECTED Final   Cryptococcus neoformans/gattii NOT DETECTED NOT DETECTED Final   Vancomycin resistance NOT DETECTED NOT DETECTED Final    Comment: Performed at [facility], [address] [place], [place] [address]  CULTURE, BLOOD (ROUTINE X 2) w Reflex to ID Panel     Status: None (Preliminary result)   Collection Time: 06/07/20  7:02 PM   Specimen: BLOOD  Result Value Ref Range Status   Specimen Description BLOOD BLOOD LEFT FOREARM  Final   Special Requests   Final    BOTTLES DRAWN AEROBIC AND ANAEROBIC Blood Culture results may not be optimal due to an inadequate volume of blood received in culture bottles   Culture   Final    NO GROWTH 4 DAYS Performed at [facility], [address] [place], [place] [address]    Report Status PENDING  Incomplete  CULTURE, BLOOD (ROUTINE X 2) w Reflex to ID Panel     Status: None (Preliminary result)   Collection Time: 06/07/20  8:46 PM   Specimen: BLOOD  Result Value Ref Range Status   Specimen Description BLOOD BLOOD LEFT HAND  Final   Special Requests   Final    BOTTLES DRAWN AEROBIC ONLY Blood Culture results may not be optimal due to an inadequate volume of blood received in culture bottles   Culture   Final    NO GROWTH 4 DAYS Performed at [facility], [address] [place], [place] [address]    Report Status PENDING  Incomplete  MRSA PCR Screening     Status: None   Collection Time: 06/11/20 10:40 AM   Specimen: Nasopharyngeal  Result Value Ref Range Status   MRSA by PCR NEGATIVE NEGATIVE Final    Comment:        The GeneXpert MRSA Assay (FDA approved for NASAL specimens only), is one component of a comprehensive MRSA colonization surveillance program. It is not intended to diagnose MRSA infection nor to guide or monitor  treatment for MRSA infections. Performed at [facility], [address] [place], [place] [address]           IMAGING    CT HEAD WO CONTRAST  Result Date: 06/11/2020 CLINICAL DATA:  Hypotension during PE.  History of dialysis EXAM: CT HEAD WITHOUT CONTRAST TECHNIQUE: Contiguous axial images were obtained from the base of the skull  through the vertex without intravenous contrast. COMPARISON:  Six days ago FINDINGS: Brain: No evidence of acute infarction, hemorrhage, hydrocephalus, extra-axial collection or mass lesion/mass effect. Vascular: Atherosclerotic calcification Skull: Generalized sclerotic appearance of the bones, likely renal. Sinuses/Orbits: Bilateral cataract resection.  No acute finding IMPRESSION: No acute finding. Electronically Signed   By: Monte Fantasia M.D.   On: 06/11/2020 10:36   CT CHEST WO CONTRAST  Result Date: 06/11/2020 CLINICAL DATA:  Hypotensive following transesophageal echocardiogram. EXAM: CT CHEST WITHOUT CONTRAST TECHNIQUE: Multidetector CT imaging of the chest was performed following the standard protocol without IV contrast. COMPARISON:  CT scan 05/22/2020 FINDINGS: Cardiovascular: The heart is mildly enlarged but stable. Stable tortuosity and calcification of the thoracic aorta. Stable three-vessel coronary artery calcifications. Right IJ Vas-Cath in good position without complicating features. Left brachiocephalic vein stent is stable. Mediastinum/Nodes: Stable scattered mediastinal and hilar lymph nodes. The esophagus is grossly normal. Lungs/Pleura: Persistent vascular congestion without overt pulmonary edema. There are small bilateral pleural effusions and lower lobe interstitial changes which have improved. Mild lower lobe peribronchial thickening. No infiltrates or pneumothorax. Upper Abdomen: No significant upper abdominal findings. Stable advanced vascular calcifications. Bilateral renal atrophy. Musculoskeletal: No significant bony findings.  IMPRESSION: 1. Persistent vascular congestion without overt pulmonary edema. 2. Small bilateral pleural effusions and lower lobe interstitial changes which have improved. 3. No infiltrates or pneumothorax. 4. Stable advanced atherosclerotic calcifications involving the thoracic and abdominal aorta and branch vessels including the coronary arteries. 5. Aortic atherosclerosis. Aortic Atherosclerosis (ICD10-I70.0). Electronically Signed   By: Marijo Sanes M.D.   On: 06/11/2020 10:46   DG Chest Port 1 View  Result Date: 06/12/2020 CLINICAL DATA:  Acute respiratory failure EXAM: PORTABLE CHEST 1 VIEW COMPARISON:  None. FINDINGS: Endotracheal tube is been placed with its tip 5.1 cm above the carina. Right internal jugular HeRO graft venous limb is seen with its tip within the right atrium. Left upper extremity central venous stenting has been performed. Minimal left basilar atelectasis. Lungs are otherwise clear. No pneumothorax or pleural effusion. Cardiac size within normal limits. Pulmonary vascularity is normal. IMPRESSION: No active disease. Electronically Signed   By: Fidela Salisbury MD   On: 06/12/2020 05:18   ECHO TEE  Result Date: 06/11/2020    TRANSESOPHOGEAL ECHO REPORT   Patient Name:   [name] Date of Exam: 06/11/2020 Medical Rec #:  [id number]   Height:       65.0 in Accession #:    [id number]  Weight:       190.5 lb Date of Birth:  1961-01-07    BSA:          1.938 m Patient Age:    56 years    BP:           147/52 mmHg Patient Gender: F           HR:           56 bpm. Exam Location:  [facility] Procedure: Transesophageal Echo, Cardiac Doppler and Color Doppler Indications:     Bacteremia 790.7  History:         Patient has prior history of Echocardiogram examinations, most                  recent 06/07/2020. COPD, Signs/Symptoms:Dyspnea; Risk                  Factors:Hypertension.  Sonographer:     Sherrie Sport RDCS (AE) Referring Phys:  [place] Diagnosing  Phys: Bartholome Bill MD PROCEDURE: The  transesophogeal probe was passed without difficulty through the esophogus of the patient. Sedation performed by different physician. The patient was monitored while under deep sedation. Image quality was excellent. The patient developed Respiratory depression and hypotension and bradycardia during the procedure. IMPRESSIONS  1. Left ventricular ejection fraction, by estimation, is 50 to 55%. The left ventricle has normal function.  2. Right ventricular systolic function is normal. The right ventricular size is normal.  3. Left atrial size was mildly dilated. No left atrial/left atrial appendage thrombus was detected.  4. Right atrial size was mildly dilated.  5. The mitral valve is grossly normal. Mild to moderate mitral valve regurgitation.  6. Tricuspid valve regurgitation is mild to moderate.  7. The aortic valve is tricuspid. Aortic valve regurgitation is not visualized. FINDINGS  Left Ventricle: Left ventricular ejection fraction, by estimation, is 50 to 55%. The left ventricle has normal function. The left ventricular internal cavity size was normal in size. Right Ventricle: The right ventricular size is normal. No increase in right ventricular wall thickness. Right ventricular systolic function is normal. Left Atrium: Left atrial size was mildly dilated. No left atrial/left atrial appendage thrombus was detected. Right Atrium: Right atrial size was mildly dilated. Pericardium: There is no evidence of pericardial effusion. Mitral Valve: The mitral valve is grossly normal. Mild to moderate mitral valve regurgitation. There is no evidence of mitral valve vegetation. Tricuspid Valve: The tricuspid valve is grossly normal. Tricuspid valve regurgitation is mild to moderate. There is no evidence of tricuspid valve vegetation. Aortic Valve: The aortic valve is tricuspid. Aortic valve regurgitation is not visualized. There is no evidence of aortic valve vegetation. Pulmonic Valve: The pulmonic valve was not well  visualized. Pulmonic valve regurgitation is not visualized. Aorta: The aortic root is normal in size and structure. IAS/Shunts: The interatrial septum was not assessed. Bartholome Bill MD Electronically signed by Bartholome Bill MD Signature Date/Time: 06/11/2020/12:41:00 PM    Final      Nutrition Status:           Indwelling Urinary Catheter continued, requirement due to   Reason to continue Indwelling Urinary Catheter strict Intake/Output monitoring for hemodynamic instability         Ventilator continued, requirement due to severe respiratory failure   Ventilator Sedation RASS 0 to -2      ASSESSMENT AND PLAN SYNOPSIS 59 yo obese female with multiple medical issues with enterococcus bacteremia s/p TEE with post op complication with near cardiac arrest with acute severe resp failure   Severe ACUTE Hypoxic and Hypercapnic Respiratory Failure due to acute hypotension and near cardiac arrest due to sedative medications with underlying multiple medical conditions with severe sepsis and bacteremia   Severe ACUTE Hypoxic and Hypercapnic Respiratory Failure due  -continue Full MV support -continue Bronchodilator Therapy -Wean Fio2 and PEEP as tolerated -will perform SAT/SBT when respiratory parameters are met -VAP/VENT bundle implementation  ACUTE DIASTOLIC CARDIAC FAILURE-  Morbid obesity, possible OSA.   Will certainly impact respiratory mechanics, ventilator weaning Suspect will need to consider additional PEEP  KIDNEY INJURY/Renal Failure -continue Foley Catheter-assess need -Avoid nephrotoxic agents -Follow urine output, BMP -Ensure adequate renal perfusion, optimize oxygenation -Renal dose medications HD as needed     NEUROLOGY - intubated and sedated - minimal sedation to achieve a RASS goal: -1 Wake up assessment pending Plan for wake up assessment and neuro examination MRI if needed   CARDIAC ICU monitoring  ID + ENTEROCOCCUS BACTEREMIA -continue  IV  abx as prescibed -follow up cultures TEE NEG  GI GI PROPHYLAXIS as indicated  ENDO - will use ICU hypoglycemic\Hyperglycemia protocol if indicated     ELECTROLYTES -follow labs as needed -replace as needed -pharmacy consultation and following   DVT/GI PRX ordered and assessed TRANSFUSIONS AS NEEDED MONITOR FSBS I Assessed the need for Labs I Assessed the need for Foley I Assessed the need for Central Venous Line Family Discussion when available I Assessed the need for Mobilization I made an Assessment of medications to be adjusted accordingly Safety Risk assessment completed   CASE DISCUSSED IN MULTIDISCIPLINARY ROUNDS WITH ICU TEAM  Critical Care Time devoted to patient care services described in this note is 45 minutes.   Overall, patient is critically ill, prognosis is guarded.  Patient with Multiorgan failure and at high risk for cardiac arrest and death.    Corrin Parker, M.D.  [facility]  Medical Director [place] Director [facility] Cardio-Pulmonary Department

## 2020-06-12 NOTE — Progress Notes (Signed)
[facility]  Daily Progress Note   Subjective   Patient intubated and sedated Blood cultures are + for Enterococcus  Objective Vitals:   06/12/20 1800 06/12/20 1900 06/12/20 2000 06/12/20 2017  BP: (!) 145/51 (!) 144/53 (!) 151/49   Pulse: (!) 56 (!) 56 (!) 56   Resp:      Temp:  98.8 F (37.1 C)    TempSrc:  Oral    SpO2: 100% 100% 100% 100%  Weight:      Height:    5\' 5"  (1.651 m)    Intake/Output Summary (Last 24 hours) at 06/12/2020 2048 Last data filed at 06/12/2020 1900 Gross per 24 hour  Intake 719.64 ml  Output 1000 ml  Net -280.36 ml    PULM  Intubated and sedated , no use of accessory muscles, not breathing over the vent CV  No JVD, RRR Abd      No distended, nontender VASC   Right arm AV Hero graft with good thrill and good bruit, mass over the arterial anastomosis not red not fluctuant nonpulsitile   Laboratory CBC    Component Value Date/Time   WBC 10.1 06/12/2020 0425   HGB 9.0 (L) 06/12/2020 0425   HGB 10.5 (L) 09/15/2014 0948   HCT 27.7 (L) 06/12/2020 0425   HCT 34.2 (L) 09/15/2014 0948   PLT 195 06/12/2020 0425   PLT 203 09/15/2014 0948    BMET    Component Value Date/Time   NA 134 (L) 06/12/2020 0425   NA 135 (L) 09/15/2014 0948   K 3.4 (L) 06/12/2020 0425   K 4.8 09/15/2014 0948   CL 97 (L) 06/12/2020 0425   CL 99 09/15/2014 0948   CO2 23 06/12/2020 0425   CO2 26 09/15/2014 0948   GLUCOSE 91 06/12/2020 0425   GLUCOSE 118 (H) 09/15/2014 0948   BUN 18 06/12/2020 0425   BUN 19 (H) 09/15/2014 0948   CREATININE 5.53 (H) 06/12/2020 0425   CREATININE 6.79 (H) 09/15/2014 0948   CALCIUM 8.3 (L) 06/12/2020 0425   CALCIUM 8.3 (L) 09/15/2014 0948   GFRNONAA 8 (L) 06/12/2020 0425   GFRNONAA 7 (L) 09/15/2014 0948   GFRNONAA 6 (L) 05/31/2014 0432   GFRAA 9 (L) 06/12/2020 0425   GFRAA 8 (L) 09/15/2014 0948   GFRAA 7 (L) 05/31/2014 0432    Assessment/Planning:  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  Patient already has a temp cath in place.  Will culture any fluid encountered.  Hortencia Pilar  06/12/2020, 8:48 PM

## 2020-06-12 NOTE — Progress Notes (Signed)
Pt resting in bed, sedated on vent. Pt had HD today and tolerated well, 1L removed. Propofol gtt was stopped after dialysis. Pt became agitated and was still unable to follow commands. Dr. Mortimer Fries ordered MRI and it is scheduled for 2215 tonight. Pt has been bradycardic in the 50s throughout shift but all other vitals stable. D50 push given twice for CBG less than 70. Plan is for pt to go to OR tomorrow for removal of graft in right arm. Pt's son has visited today.

## 2020-06-12 NOTE — Progress Notes (Signed)
HD completed tolerated well vitals stable pt stable ufg achieved right fem DC WNL

## 2020-06-13 ENCOUNTER — Inpatient Hospital Stay: Payer: Medicare HMO | Admitting: Anesthesiology

## 2020-06-13 ENCOUNTER — Encounter
Admission: EM | Disposition: A | Discharge status: Skilled Nursing Facility | Payer: Self-pay | Source: Home / Self Care | Attending: Hospitalist

## 2020-06-13 DIAGNOSIS — T82898A Other specified complication of vascular prosthetic devices, implants and grafts, initial encounter: Secondary | ICD-10-CM

## 2020-06-13 HISTORY — PX: LIGATIONS OF HERO GRAFT: SHX6714

## 2020-06-13 LAB — RENAL FUNCTION PANEL
Albumin: 2.4 g/dL — ABNORMAL LOW (ref 3.5–5.0)
Anion gap: 13 (ref 5–15)
BUN: 14 mg/dL (ref 8–23)
CO2: 26 mmol/L (ref 22–32)
Calcium: 7.6 mg/dL — ABNORMAL LOW (ref 8.9–10.3)
Chloride: 97 mmol/L — ABNORMAL LOW (ref 98–111)
Creatinine, Ser: 4.39 mg/dL — ABNORMAL HIGH (ref 0.44–1.00)
GFR calc Af Amer: 12 mL/min — ABNORMAL LOW (ref >60)
GFR calc non Af Amer: 10 mL/min — ABNORMAL LOW (ref >60)
Glucose, Bld: 83 mg/dL (ref 70–99)
Phosphorus: 3.2 mg/dL (ref 2.5–4.6)
Potassium: 3.4 mmol/L — ABNORMAL LOW (ref 3.5–5.1)
Sodium: 136 mmol/L (ref 135–145)

## 2020-06-13 LAB — GLUCOSE, CAPILLARY
Glucose-Capillary: 117 mg/dL — ABNORMAL HIGH (ref 70–99)
Glucose-Capillary: 118 mg/dL — ABNORMAL HIGH (ref 70–99)
Glucose-Capillary: 133 mg/dL — ABNORMAL HIGH (ref 70–99)
Glucose-Capillary: 61 mg/dL — ABNORMAL LOW (ref 70–99)
Glucose-Capillary: 90 mg/dL (ref 70–99)
Glucose-Capillary: 93 mg/dL (ref 70–99)
Glucose-Capillary: 99 mg/dL (ref 70–99)

## 2020-06-13 LAB — CBC
HCT: 30.5 % — ABNORMAL LOW (ref 36.0–46.0)
Hemoglobin: 10.2 g/dL — ABNORMAL LOW (ref 12.0–15.0)
MCH: 29.2 pg (ref 26.0–34.0)
MCHC: 33.4 g/dL (ref 30.0–36.0)
MCV: 87.4 fL (ref 80.0–100.0)
Platelets: 209 10*3/uL (ref 150–400)
RBC: 3.49 MIL/uL — ABNORMAL LOW (ref 3.87–5.11)
RDW: 18.2 % — ABNORMAL HIGH (ref 11.5–15.5)
WBC: 12 10*3/uL — ABNORMAL HIGH (ref 4.0–10.5)
nRBC: 0 % (ref 0.0–0.2)

## 2020-06-13 LAB — PROTIME-INR
INR: 1.3 — ABNORMAL HIGH (ref 0.8–1.2)
Prothrombin Time: 15.3 seconds — ABNORMAL HIGH (ref 11.4–15.2)

## 2020-06-13 LAB — BASIC METABOLIC PANEL
Anion gap: 15 (ref 5–15)
BUN: 13 mg/dL (ref 8–23)
CO2: 27 mmol/L (ref 22–32)
Calcium: 7.9 mg/dL — ABNORMAL LOW (ref 8.9–10.3)
Chloride: 96 mmol/L — ABNORMAL LOW (ref 98–111)
Creatinine, Ser: 4.36 mg/dL — ABNORMAL HIGH (ref 0.44–1.00)
GFR calc Af Amer: 12 mL/min — ABNORMAL LOW (ref >60)
GFR calc non Af Amer: 10 mL/min — ABNORMAL LOW (ref >60)
Glucose, Bld: 88 mg/dL (ref 70–99)
Potassium: 3.3 mmol/L — ABNORMAL LOW (ref 3.5–5.1)
Sodium: 138 mmol/L (ref 135–145)

## 2020-06-13 LAB — MAGNESIUM: Magnesium: 1.8 mg/dL (ref 1.7–2.4)

## 2020-06-13 LAB — APTT: aPTT: 54 seconds — ABNORMAL HIGH (ref 24–36)

## 2020-06-13 SURGERY — LIGATIONS OF HERO GRAFT
Anesthesia: General | Site: Arm Lower | Laterality: Right

## 2020-06-13 MED ORDER — HEPARIN SODIUM (PORCINE) 1000 UNIT/ML IJ SOLN
INTRAMUSCULAR | Status: AC
  Filled 2020-06-13: qty 1

## 2020-06-13 MED ORDER — FENTANYL CITRATE (PF) 100 MCG/2ML IJ SOLN
INTRAMUSCULAR | Status: AC
  Filled 2020-06-13: qty 2

## 2020-06-13 MED ORDER — DEXAMETHASONE SODIUM PHOSPHATE 10 MG/ML IJ SOLN
INTRAMUSCULAR | Status: AC
  Filled 2020-06-13: qty 1

## 2020-06-13 MED ORDER — ROCURONIUM BROMIDE 100 MG/10ML IV SOLN
INTRAVENOUS | Status: DC | PRN
  Administered 2020-06-13: 60 mg via INTRAVENOUS

## 2020-06-13 MED ORDER — PHENYLEPHRINE HCL (PRESSORS) 10 MG/ML IV SOLN
INTRAVENOUS | Status: AC
  Filled 2020-06-13: qty 1

## 2020-06-13 MED ORDER — VANCOMYCIN HCL IN DEXTROSE 1-5 GM/200ML-% IV SOLN
1000.0000 mg | INTRAVENOUS | Status: AC
  Administered 2020-06-13: 1000 mg via INTRAVENOUS
  Filled 2020-06-13: qty 200

## 2020-06-13 MED ORDER — DEXAMETHASONE SODIUM PHOSPHATE 10 MG/ML IJ SOLN
INTRAMUSCULAR | Status: DC | PRN
  Administered 2020-06-13: 10 mg via INTRAVENOUS

## 2020-06-13 MED ORDER — MIDAZOLAM HCL 2 MG/2ML IJ SOLN
INTRAMUSCULAR | Status: DC | PRN
  Administered 2020-06-13: 2 mg via INTRAVENOUS

## 2020-06-13 MED ORDER — EPHEDRINE SULFATE 50 MG/ML IJ SOLN
INTRAMUSCULAR | Status: DC | PRN
  Administered 2020-06-13: 25 mg via INTRAVENOUS

## 2020-06-13 MED ORDER — EPHEDRINE 5 MG/ML INJ
INTRAVENOUS | Status: AC
  Filled 2020-06-13: qty 10

## 2020-06-13 MED ORDER — MIDAZOLAM HCL 2 MG/2ML IJ SOLN
INTRAMUSCULAR | Status: AC
  Filled 2020-06-13: qty 2

## 2020-06-13 MED ORDER — SODIUM CHLORIDE 0.9 % IV SOLN: INTRAVENOUS | Status: DC | PRN

## 2020-06-13 MED ORDER — GENTAMICIN IN SALINE 1.6-0.9 MG/ML-% IV SOLN
80.0000 mg | INTRAVENOUS | Status: DC
  Filled 2020-06-13: qty 50

## 2020-06-13 MED ORDER — DEXTROSE 50 % IV SOLN
1.0000 | Freq: Once | INTRAVENOUS | Status: AC
  Administered 2020-06-13: 50 mL via INTRAVENOUS
  Filled 2020-06-13: qty 50

## 2020-06-13 MED ORDER — MORPHINE SULFATE (PF) 2 MG/ML IV SOLN: 2.0000 mg | INTRAVENOUS | Status: DC | PRN

## 2020-06-13 MED ORDER — HEMOSTATIC AGENTS (NO CHARGE) OPTIME
TOPICAL | Status: DC | PRN
  Administered 2020-06-13 (×3): 1 via TOPICAL

## 2020-06-13 MED ORDER — FENTANYL CITRATE (PF) 100 MCG/2ML IJ SOLN
INTRAMUSCULAR | Status: DC | PRN
Start: 2020-06-13 — End: 2020-06-13
  Administered 2020-06-13 (×2): 50 ug via INTRAVENOUS

## 2020-06-13 MED ORDER — ROCURONIUM BROMIDE 10 MG/ML (PF) SYRINGE
PREFILLED_SYRINGE | INTRAVENOUS | Status: AC
  Filled 2020-06-13: qty 10

## 2020-06-13 MED ORDER — LACTATED RINGERS IV SOLN: INTRAVENOUS | Status: DC | PRN

## 2020-06-13 MED ORDER — HEPARIN SODIUM (PORCINE) 1000 UNIT/ML IJ SOLN
INTRAMUSCULAR | Status: DC | PRN
  Administered 2020-06-13: 3000 [IU] via INTRAVENOUS

## 2020-06-13 SURGICAL SUPPLY — 67 items
ADH SKN CLS APL DERMABOND .7 (GAUZE/BANDAGES/DRESSINGS) ×1
APL PRP STRL LF DISP 70% ISPRP (MISCELLANEOUS) ×1
APPLIER CLIP 9.375 SM OPEN (CLIP)
APR CLP SM 9.3 20 MLT OPN (CLIP)
BAG DECANTER FOR FLEXI CONT (MISCELLANEOUS) ×2 IMPLANT
BLADE SURG 15 STRL LF DISP TIS (BLADE) ×1 IMPLANT
BLADE SURG 15 STRL SS (BLADE) ×2
BLADE SURG SZ11 CARB STEEL (BLADE) ×2 IMPLANT
BOOT SUTURE AID YELLOW STND (SUTURE) ×2 IMPLANT
BRUSH SCRUB EZ  4% CHG (MISCELLANEOUS) ×2
BRUSH SCRUB EZ 4% CHG (MISCELLANEOUS) ×1 IMPLANT
CANISTER SUCT 1200ML W/VALVE (MISCELLANEOUS) ×2 IMPLANT
CHLORAPREP W/TINT 26 (MISCELLANEOUS) ×2 IMPLANT
CLIP APPLIE 9.375 SM OPEN (CLIP) IMPLANT
COVER WAND RF STERILE (DRAPES) ×2 IMPLANT
DERMABOND ADVANCED (GAUZE/BANDAGES/DRESSINGS) ×1
DERMABOND ADVANCED .7 DNX12 (GAUZE/BANDAGES/DRESSINGS) ×1 IMPLANT
DRAPE 3/4 80X56 (DRAPES) ×2 IMPLANT
DRAPE IMP U-DRAPE 54X76 (DRAPES) ×2 IMPLANT
DRESSING SURGICEL FIBRLLR 1X2 (HEMOSTASIS) ×3 IMPLANT
DRSG OPSITE POSTOP 3X4 (GAUZE/BANDAGES/DRESSINGS) ×6 IMPLANT
DRSG OPSITE POSTOP 4X6 (GAUZE/BANDAGES/DRESSINGS) ×6 IMPLANT
DRSG SURGICEL FIBRILLAR 1X2 (HEMOSTASIS) ×6
ELECT CAUTERY BLADE 6.4 (BLADE) ×2 IMPLANT
ELECT REM PT RETURN 9FT ADLT (ELECTROSURGICAL) ×2
ELECTRODE REM PT RTRN 9FT ADLT (ELECTROSURGICAL) ×1 IMPLANT
GAUZE PACKING IODOFORM 1/2 (PACKING) ×4 IMPLANT
GAUZE SPONGE 4X4 16PLY XRAY LF (GAUZE/BANDAGES/DRESSINGS) ×4 IMPLANT
GLOVE BIO SURGEON STRL SZ7 (GLOVE) ×4 IMPLANT
GLOVE INDICATOR 7.5 STRL GRN (GLOVE) ×4 IMPLANT
GLOVE SURG SYN 8.0 (GLOVE) ×2 IMPLANT
GOWN STRL REUS W/ TWL LRG LVL3 (GOWN DISPOSABLE) ×3 IMPLANT
GOWN STRL REUS W/ TWL XL LVL3 (GOWN DISPOSABLE) ×2 IMPLANT
GOWN STRL REUS W/TWL LRG LVL3 (GOWN DISPOSABLE) ×6
GOWN STRL REUS W/TWL XL LVL3 (GOWN DISPOSABLE) ×4
HANDLE YANKAUER SUCT BULB TIP (MISCELLANEOUS) ×2 IMPLANT
IV NS 500ML (IV SOLUTION) ×2
IV NS 500ML BAXH (IV SOLUTION) ×1 IMPLANT
KIT TURNOVER KIT A (KITS) ×2 IMPLANT
LABEL OR SOLS (LABEL) ×2 IMPLANT
LOOP RED MAXI  1X406MM (MISCELLANEOUS) ×1
LOOP VESSEL MAXI  1X406 RED (MISCELLANEOUS) ×1
LOOP VESSEL MAXI 1X406 RED (MISCELLANEOUS) ×1 IMPLANT
LOOP VESSEL MINI 0.8X406 BLUE (MISCELLANEOUS) ×1 IMPLANT
LOOPS BLUE MINI 0.8X406MM (MISCELLANEOUS) ×1
NEEDLE HYPO 25X1 1.5 SAFETY (NEEDLE) IMPLANT
NS IRRIG 1000ML POUR BTL (IV SOLUTION) ×2 IMPLANT
PACK EXTREMITY (MISCELLANEOUS) ×2 IMPLANT
PAD PREP 24X41 OB/GYN DISP (PERSONAL CARE ITEMS) ×2 IMPLANT
PATCH CAROTID ECM VASC 1X10 (Prosthesis & Implant Heart) ×2 IMPLANT
SPONGE LAP 18X18 RF (DISPOSABLE) ×2 IMPLANT
STAPLER SKIN PROX 35W (STAPLE) ×2 IMPLANT
STOCKINETTE STRL 4IN 9604848 (GAUZE/BANDAGES/DRESSINGS) ×2 IMPLANT
SUT MNCRL+ 5-0 UNDYED PC-3 (SUTURE) ×1 IMPLANT
SUT MONOCRYL 5-0 (SUTURE) ×2
SUT PROLENE 6 0 BV (SUTURE) ×14 IMPLANT
SUT SILK 2 0 (SUTURE) ×2
SUT SILK 2-0 18XBRD TIE 12 (SUTURE) ×1 IMPLANT
SUT SILK 3 0 (SUTURE) ×2
SUT SILK 3-0 18XBRD TIE 12 (SUTURE) ×1 IMPLANT
SUT SILK 4 0 (SUTURE) ×2
SUT SILK 4-0 18XBRD TIE 12 (SUTURE) ×1 IMPLANT
SUT VIC AB 3-0 SH 27 (SUTURE) ×4
SUT VIC AB 3-0 SH 27X BRD (SUTURE) ×2 IMPLANT
SUT VICRYL+ 3-0 36IN CT-1 (SUTURE) ×2 IMPLANT
SYR 20ML LL LF (SYRINGE) ×2 IMPLANT
TUBING CONNECTING 10 (TUBING) ×2 IMPLANT

## 2020-06-13 NOTE — Op Note (Signed)
OPERATIVE NOTE   PROCEDURE: 1. Removal of infected right arm hero AV graft 2. Endarterectomy of the right brachial artery with Cormatrix patch  PRE-OPERATIVE DIAGNOSIS: Infected right arm AV graft; end-stage renal disease on hemodialysis  POST-OPERATIVE DIAGNOSIS: Same; hemodynamically significant atherosclerotic occlusive disease of the right brachial artery  SURGEON: Katha Cabal, M.D. ASSISTANT(S): Ms Hezzie Bump  ANESTHESIA: general  ESTIMATED BLOOD LOSS: 125 cc  FINDING(S): 1.  abscess near the arterial anastomosis  SPECIMEN(S):  AV graft in segments  INDICATIONS:   [name] is a 59 y.o. female who presents with an infected AV access.  Given the prosthetic material this will require excision. The risks and benefits of been reviewed all questions are answered patient has agreed to proceed.  DESCRIPTION: After obtaining full informed written consent, the patient was brought back to the operating room and placed supine upon the operating table.  The patient received IV antibiotics prior to induction.  After obtaining adequate anesthesia, the patient's right arm was prepped and draped in the standard fashion appropriate time out is called.    The previous incisional scar overlying the anastomosis is then re-incised and the dissection is carried down to expose the arterial anastomosis. The native artery is then dissected circumferentially proximally and distally and the suture line is located.   The graft is then retracted and dissected circumferentially 0 Ethibond is then used to ligate the graft beyond the anastomosis. Hemostat is then used to control the graft just above the anastomosis and the graft is transected. Final dissection around the artery is then performed. 3000 units of heparin was given and the femoral artery is then clamped proximally and distally. Using a 15 blade scalpel the suture line is incised and the entire prosthetic material is removed from the  artery. Hemodynamically significant atherosclerotic occlusive disease is identified at this level and therefore a Soil scientist is used to perform endarterectomy of the right brachial artery. After achieving an adequate intimal edge a Cor matrix patch is then rehydrated on the back table trimmed to an appropriate shape and applied to repair the arterial defect using running 6-0 Prolene. Flushing maneuvers were performed and flow was reestablished to the hand. Suture line is inspected bleeding points are controlled with interrupted 6-0 Prolene as needed.  Attention is then turned to the grommet connecting the PTFE peripheral portion to the intravascular portion.  Linear incision is created through the old scar and the grommet is exposed.  The Ethibond suture securing the link to the fascia are then cut with scissors and the intravascular portion is removed without difficulty.  Pressure was held at the base of neck and the tunnel is oversewn with a 3-0 Vicryl.  Small counter incisions are then made at multiple locations along the course of the graft exposing the graft at each of these locations.  The graft is then removed in multiple pieces.  A second graft was located within the incision this was a pre-existing old graft that was present before the hero graft was placed the hero graft was tunneled more laterally however this graft now appeared to be involved with this process and it was removed as well in a similar fashion through small ladder incisions.  The wound is then irrigated with a liter of saline Surgicel is placed and all the wounds.  The wounds is closed in layers over the repaired vessels using 3 oh Vicryl followed followed by 4-0 staples.  Honeycomb dressings are applied.    COMPLICATIONS:  None  CONDITION: Carlynn Purl, M.D. [facility] Office: [phone]   06/13/2020, 4:39 PM

## 2020-06-13 NOTE — Progress Notes (Signed)
Pt transported to MRI and returned to ICU 10 on the vent without incident. Pt remains on the vent and is tol well at this time.

## 2020-06-13 NOTE — Anesthesia Preprocedure Evaluation (Signed)
Anesthesia Evaluation  Patient identified by MRN, date of birth, ID band Patient awake    Reviewed: Allergy & Precautions, H&P , NPO status , Patient's Chart, lab work & pertinent test results  History of Anesthesia Complications Negative for: history of anesthetic complications  Airway Mallampati: Intubated       Dental  (+) Edentulous Lower, Edentulous Upper   Pulmonary shortness of breath, neg sleep apnea, COPD, Patient abstained from smoking.,  Intubated on minimal vent settings   + rhonchi        Cardiovascular hypertension, (-) angina+ CAD, + Past MI, + Cardiac Stents, + Peripheral Vascular Disease and +CHF  (-) dysrhythmias  Rhythm:regular Rate:Bradycardia  TTE 06/07/20: 1. Left ventricular ejection fraction, by estimation, is 60 to 65%. The  left ventricle has normal function. The left ventricle has no regional  wall motion abnormalities. There is mild left ventricular hypertrophy.  Left ventricular diastolic parameters  are consistent with Grade I diastolic dysfunction (impaired relaxation).  2. Right ventricular systolic function was not well visualized. The right  ventricular size is mildly enlarged.  3. Right atrial size was mildly dilated.  4. The mitral valve is grossly normal. Trivial mitral valve  regurgitation.  5. Tricuspid valve regurgitation is moderate.  6. The aortic valve is tricuspid. Aortic valve regurgitation is not  visualized.    Neuro/Psych  Headaches, PSYCHIATRIC DISORDERS Anxiety Depression Peripheral neuropathy TIA   GI/Hepatic Neg liver ROS, hiatal hernia, GERD  ,  Endo/Other  diabetesSecondary hyperparathyroidism  Renal/GU ESRF and DialysisRenal disease  negative genitourinary   Musculoskeletal   Abdominal   Peds  Hematology  (+) Blood dyscrasia, anemia , Hgb 8.4   Anesthesia Other Findings ESRD on HD, HTN, DM, CAD, COPD, PVD admitted on 9/11 after coming in with s/sxs  concerning for PNA vs sepsis, volume overload and COPD exacerbation. Suffered a cardiac event during TEE 2 days ago (severe bradycardia and hypotension requiring atropine and epinephrine), did not regain consciousness after procedure, intubated and sent to ICU. Imaging has not revealed etiology of altered mental status thus far including negative brain MRI. Presenting for removal of AVF out of concern for infection.  CV: no pressors Resp: PRVC 21% FiO2 Access: PIVs, dialysis catheter  Past Medical History: No date: Anemia No date: Anginal pain ([place]) No date: Anxiety No date: Arthritis No date: Asthma No date: Broken wrist No date: Bronchitis 1/44/8185: chronic diastolic CHF No date: Chronic kidney disease No date: COPD (chronic obstructive pulmonary disease) (HCC) No date: Coronary artery disease     Comment:  a. cath 2013: stenting to RCA (report not available); b.              cath 2014: LM nl, pLAD 40%, mLAD nl, ost LCx 40%, mid LCx              nl, pRCA 30% @ site of prior stent, mRCA 50% No date: Depression No date: Diabetes mellitus ([place]) No date: Diabetes mellitus without complication ([place]) No date: Diabetic neuropathy ([place]) 2006: dialysis No date: Diverticulosis No date: Dizziness No date: Dyspnea No date: Elevated lipids No date: Environmental and seasonal allergies No date: ESRD (end stage renal disease) on dialysis ([place])     Comment:  M-W-F No date: Gastroparesis No date: GERD (gastroesophageal reflux disease) No date: Headache No date: History of anemia due to chronic kidney disease No date: History of hiatal hernia No date: HOH (hard of hearing) 43: Hx of pancreatitis No date: Hypertension No date: Lower extremity  edema No date: Mitral regurgitation     Comment:  a. echo 10/2013: EF 62%, noWMA, mildly dilated LA, mild               to mod MR/TR, GR1DD No date: Myocardial infarction ([place]) No date: Orthopnea No date: Parathyroid abnormality (HCC) No date:  Peripheral arterial disease ([place]) No date: Pneumonia No date: Renal cancer ([place]) No date: Renal insufficiency     Comment:  Pt is on dialysis on M,W + F. No date: Wheezing  Past Surgical History: 01/20/2018: A/V SHUNTOGRAM; Left     Comment:  Procedure: A/V SHUNTOGRAM;  Surgeon: Algernon Huxley, MD;                Location: [place] [facility];  Service: Cardiovascular;              Laterality: Left; 1992: ABDOMINAL HYSTERECTOMY 10/02/2017: AMPUTATION TOE; Left     Comment:  Procedure: AMPUTATION TOE-LEFT GREAT TOE;  Surgeon:               Albertine Patricia, DPM;  Location: [facility];  Service:               Podiatry;  Laterality: Left; No date: APPENDECTOMY 12/22/3242: APPLICATION OF WOUND VAC; N/A     Comment:  Procedure: APPLICATION OF WOUND VAC;  Surgeon: Katha Cabal, MD;  Location: [facility];  Service: Vascular;                Laterality: N/A; 10/11/2018: ARTERY BIOPSY; Right     Comment:  Procedure: BIOPSY TEMPORAL ARTERY;  Surgeon: Vickie Epley, MD;  Location: [facility];  Service: General;                Laterality: Right; 07/26/2015: CARDIAC CATHETERIZATION; Left     Comment:  Procedure: Left Heart Cath and Coronary Angiography;                Surgeon: Dionisio David, MD;  Location: [place] [facility];  Service: Cardiovascular;  Laterality: Left; No date: CATARACT EXTRACTION W/ INTRAOCULAR LENS IMPLANT; Right 03/10/2017: CATARACT EXTRACTION W/PHACO; Left     Comment:  Procedure: CATARACT EXTRACTION PHACO AND INTRAOCULAR               LENS PLACEMENT (IOC);  Surgeon: Birder Robson, MD;                Location: [facility];  Service: Ophthalmology;  Laterality:              Left;  [place] 00:51.9 AP% 14.2 CDE 7.39 fluid pack lot #               [id number] 11/11/2019: CENTRAL LINE INSERTION; Right     Comment:  Procedure: CENTRAL LINE INSERTION;  Surgeon: Katha Cabal, MD;  Location: [facility];  Service: Vascular;                 Laterality: Right; 11/25/2019: CENTRAL LINE INSERTION     Comment:  Procedure: CENTRAL LINE INSERTION;  Surgeon: Katha Cabal, MD;  Location: [facility];  Service: Vascular;; No date: CHOLECYSTECTOMY 08/12/2016: COLONOSCOPY WITH PROPOFOL; N/A     Comment:  Procedure: COLONOSCOPY WITH PROPOFOL;  Surgeon: Lollie Sails, MD;  Location: [facility];  Service:               Endoscopy;  Laterality: N/A; No date: DIALYSIS FISTULA CREATION; Left     Comment:  upper arm No date: dialysis grafts 11/14/2019: DIALYSIS/PERMA CATHETER INSERTION; N/A     Comment:  Procedure: DIALYSIS/PERMA CATHETER INSERTION;  Surgeon:               Algernon Huxley, MD;  Location: [place] [facility];                Service: Cardiovascular;  Laterality: N/A; 02/03/2020: DIALYSIS/PERMA CATHETER INSERTION; N/A     Comment:  Procedure: DIALYSIS/PERMA CATHETER INSERTION;  Surgeon:               Katha Cabal, MD;  Location: [place] [facility];               Service: Cardiovascular;  Laterality: N/A; 05/25/2020: DIALYSIS/PERMA CATHETER REMOVAL; N/A     Comment:  Procedure: DIALYSIS/PERMA CATHETER REMOVAL;  Surgeon:               Katha Cabal, MD;  Location: [place] [facility];               Service: Cardiovascular;  Laterality: N/A; 03/08/2015: ESOPHAGOGASTRODUODENOSCOPY; N/A     Comment:  Procedure: ESOPHAGOGASTRODUODENOSCOPY (EGD);  Surgeon:               Manya Silvas, MD;  Location: [facility] ENDOSCOPY;                Service: Endoscopy;  Laterality: N/A; 03/18/2016: ESOPHAGOGASTRODUODENOSCOPY (EGD) WITH PROPOFOL; N/A     Comment:  Procedure: ESOPHAGOGASTRODUODENOSCOPY (EGD) WITH               PROPOFOL;  Surgeon: [name], MD;  Location: [facility]               ENDOSCOPY;  Service: Endoscopy;  Laterality: N/A; 2018: EYE SURGERY; Right 08/23/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; N/A     Comment:  Procedure: FECAL TRANSPLANT;  Surgeon: Manya Silvas,              MD;  Location:  [facility] ENDOSCOPY;  Service: Endoscopy;                Laterality: N/A; No date: HAND SURGERY; Bilateral 11/25/2019: HEMATOMA EVACUATION; Left     Comment:  Procedure: EVACUATION HEMATOMA;  Surgeon: Katha Cabal, MD;  Location: [facility];  Service: Vascular;                Laterality: Left; 11/25/2019: I & D EXTREMITY; Left     Comment:  Procedure: IRRIGATION AND DEBRIDEMENT EXTREMITY;                Surgeon: Katha Cabal, MD;  Location: [facility];                Service: Vascular;  Laterality: Left; 04/06/2020: IR FLUORO GUIDE CV LINE RIGHT 07/28/2019: IR RADIOLOGIST EVAL & MGMT 08/11/2019: IR RADIOLOGIST EVAL & MGMT 11/11/2019: LIGATION OF ARTERIOVENOUS  FISTULA; Left 11/11/2019: LIGATION OF ARTERIOVENOUS  FISTULA; Left  Comment:  Procedure: LIGATION OF ARTERIOVENOUS  FISTULA;  Surgeon:              Katha Cabal, MD;  Location: [facility];  Service:               Vascular;  Laterality: Left; 12/20/2015: PERIPHERAL VASCULAR CATHETERIZATION; N/A     Comment:  Procedure: Thrombectomy of dialysis access versus               permcath placement;  Surgeon: Algernon Huxley, MD;  Location:              [place] [facility];  Service: Cardiovascular;                Laterality: N/A; 12/20/2015: PERIPHERAL VASCULAR CATHETERIZATION; N/A     Comment:  Procedure: A/V Shunt Intervention;  Surgeon: Algernon Huxley, MD;  Location: [place] [facility];  Service:               Cardiovascular;  Laterality: N/A; 12/20/2015: PERIPHERAL VASCULAR CATHETERIZATION; N/A     Comment:  Procedure: A/V Shuntogram/Fistulagram;  Surgeon: Algernon Huxley, MD;  Location: [place] [facility];  Service:               Cardiovascular;  Laterality: N/A; 01/02/2016: PERIPHERAL VASCULAR CATHETERIZATION; N/A     Comment:  Procedure: A/V Shuntogram/Fistulagram;  Surgeon: Algernon Huxley, MD;  Location: [place] [facility];  Service:               Cardiovascular;  Laterality:  N/A; 01/02/2016: PERIPHERAL VASCULAR CATHETERIZATION; N/A     Comment:  Procedure: A/V Shunt Intervention;  Surgeon: Algernon Huxley, MD;  Location: [place] [facility];  Service:               Cardiovascular;  Laterality: N/A; 01/18/2020: UPPER EXTREMITY VENOGRAPHY; Right     Comment:  Procedure: UPPER EXTREMITY VENOGRAPHY;  Surgeon:               Katha Cabal, MD;  Location: [place] [facility];               Service: Cardiovascular;  Laterality: Right; 04/13/2020: VASCULAR ACCESS DEVICE INSERTION; Right     Comment:  Procedure: INSERTION OF HERO VASCULAR ACCESS DEVICE               (GRAFT);  Surgeon: Katha Cabal, MD;  Location:               [facility];  Service: Vascular;  Laterality: Right;  BMI    Body Mass Index: 31.70 kg/m      Reproductive/Obstetrics negative OB ROS                             Anesthesia Physical  Anesthesia Plan  ASA: IV  Anesthesia Plan: General   Post-op Pain Management:    Induction: Inhalational  PONV Risk Score and Plan:   Airway Management Planned: Oral ETT  Additional Equipment:   Intra-op Plan:   Post-operative Plan: Post-operative intubation/ventilation  Informed Consent: I have reviewed the patients History and Physical,  chart, labs and discussed the procedure including the risks, benefits and alternatives for the proposed anesthesia with the patient or authorized representative who has indicated his/her understanding and acceptance.       Plan Discussed with: Anesthesiologist, CRNA and Surgeon  Anesthesia Plan Comments: (Informed consent obtained at bedside from pt's son.  Discussed increased risk of adverse events given critical illness.  KR)        Anesthesia Quick Evaluation

## 2020-06-13 NOTE — Progress Notes (Signed)
Patient went down with surgery team. 13:55pm.

## 2020-06-13 NOTE — Anesthesia Procedure Notes (Signed)
Anesthesia Procedure Note     

## 2020-06-13 NOTE — Progress Notes (Signed)
CRITICAL CARE NOTE 59 y.o.femalewith medical history significant ofend-stage renal disease on hemodialysis Mondays Wednesdays and Fridays, hypertension, COPD, diastolic dysfunction CHF, depression, diabetes, hyperlipidemia, who apparently missed her dialysis on Friday.    9/20 TEE performed Tee revealed no evidence of endocartidits. Procedure complicated by hypotension and bradycaradia with heart rates dropping to mid 20's and blood pressure systolic to 32I. Received 1 amp of atropine, amp of epinephrine and neosynephrine. Pt intubated to protect air way. Stat head ct and chest ct were unremarkable. Pt son notified emmediately and has been updated with care. 9/21 remains on vent, plan for SAT/SBT 9/22 plan for OR today  CC  follow up respiratory failure  SUBJECTIVE Patient remains critically ill Prognosis is guarded   BP (!) 155/87   Pulse (!) 55   Temp 98.9 F (37.2 C) (Oral)   Resp 16   Ht 5' 5"  (1.651 m)   Wt 76.3 kg   SpO2 100%   BMI 27.99 kg/m    I/O last 3 completed shifts: In: 961.4 [I.V.:462; IV Piggyback:499.4] Out: 1000 [Other:1000] No intake/output data recorded.  SpO2: 100 % O2 Flow Rate (L/min):  (Intubated ) FiO2 (%): 24 %  Estimated body mass index is 27.99 kg/m as calculated from the following:   Height as of this encounter: 5' 5"  (1.651 m).   Weight as of this encounter: 76.3 kg.  REVIEW OF SYSTEMS  PATIENT IS UNABLE TO PROVIDE COMPLETE REVIEW OF SYSTEM S DUE TO SEVERE CRITICAL ILLNESS AND ENCEPHALOPATHY   PHYSICAL EXAMINATION:  GENERAL:critically ill appearing, +resp distress HEAD: Normocephalic, atraumatic.  EYES: Pupils equal, round, reactive to light.  No scleral icterus.  MOUTH: Moist mucosal membrane. NECK: Supple. No thyromegaly. No nodules. No JVD.  PULMONARY: +rhonchi, +wheezing CARDIOVASCULAR: S1 and S2. Regular rate and rhythm. No murmurs, rubs, or gallops.  GASTROINTESTINAL: Soft, nontender, -distended. Positive bowel  sounds.  MUSCULOSKELETAL: No swelling, clubbing, or edema.  NEUROLOGIC: obtunded SKIN:intact,warm,dry   MEDICATIONS: I have reviewed all medications and confirmed regimen as documented   CULTURE RESULTS   Recent Results (from the past 240 hour(s))  Gastrointestinal Panel by PCR , Stool     Status: None   Collection Time: 06/04/20  7:39 PM   Specimen: Stool  Result Value Ref Range Status   Campylobacter species NOT DETECTED NOT DETECTED Final   Plesimonas shigelloides NOT DETECTED NOT DETECTED Final   Salmonella species NOT DETECTED NOT DETECTED Final   Yersinia enterocolitica NOT DETECTED NOT DETECTED Final   Vibrio species NOT DETECTED NOT DETECTED Final   Vibrio cholerae NOT DETECTED NOT DETECTED Final   Enteroaggregative E coli (EAEC) NOT DETECTED NOT DETECTED Final   Enteropathogenic E coli (EPEC) NOT DETECTED NOT DETECTED Final   Enterotoxigenic E coli (ETEC) NOT DETECTED NOT DETECTED Final   Shiga like toxin producing E coli (STEC) NOT DETECTED NOT DETECTED Final   Shigella/Enteroinvasive E coli (EIEC) NOT DETECTED NOT DETECTED Final   Cryptosporidium NOT DETECTED NOT DETECTED Final   Cyclospora cayetanensis NOT DETECTED NOT DETECTED Final   Entamoeba histolytica NOT DETECTED NOT DETECTED Final   Giardia lamblia NOT DETECTED NOT DETECTED Final   Adenovirus F40/41 NOT DETECTED NOT DETECTED Final   Astrovirus NOT DETECTED NOT DETECTED Final   Norovirus GI/GII NOT DETECTED NOT DETECTED Final   Rotavirus A NOT DETECTED NOT DETECTED Final   Sapovirus (I, II, IV, and V) NOT DETECTED NOT DETECTED Final    Comment: Performed at [facility], [address] [place], [place]  [address]  Culture, blood (Routine X 2) w Reflex to ID Panel     Status: Abnormal   Collection Time: 06/04/20  8:49 PM   Specimen: BLOOD  Result Value Ref Range Status   Specimen Description   Final    BLOOD BLOOD LEFT FOREARM Performed at [facility], [address] [place],  [place] [address]    Special Requests   Final    BOTTLES DRAWN AEROBIC AND ANAEROBIC Blood Culture adequate volume Performed at [facility], [address] [place], [place] [address]    Culture  Setup Time   Final    IN BOTH AEROBIC AND ANAEROBIC BOTTLES GRAM POSITIVE COCCI Organism ID to follow CRITICAL RESULT CALLED TO, READ BACK BY AND VERIFIED WITH: CHARLES SHANLEVER @1636  06/06/20 MJU LISA KLUTTZ AT 5277 ON 06/07/2020 [place]. Performed at [facility], [place] [address], [place], [place] [address]    Culture ENTEROCOCCUS FAECALIS (A)  Final   Report Status 06/08/2020 FINAL  Final   Organism ID, Bacteria ENTEROCOCCUS FAECALIS  Final      Susceptibility   Enterococcus faecalis - MIC*    AMPICILLIN <=2 SENSITIVE Sensitive     VANCOMYCIN 1 SENSITIVE Sensitive     GENTAMICIN SYNERGY SENSITIVE Sensitive     * ENTEROCOCCUS FAECALIS  Blood Culture ID Panel (Reflexed)     Status: Abnormal   Collection Time: 06/04/20  8:49 PM  Result Value Ref Range Status   Enterococcus faecalis DETECTED (A) NOT DETECTED Final    Comment: CRITICAL RESULT CALLED TO, READ BACK BY AND VERIFIED WITH: CHARLES SHANLEVER @1636  06/06/20 MJU    Enterococcus Faecium NOT DETECTED NOT DETECTED Final   Listeria monocytogenes NOT DETECTED NOT DETECTED Final   Staphylococcus species NOT DETECTED NOT DETECTED Final   Staphylococcus aureus (BCID) NOT DETECTED NOT DETECTED Final   Staphylococcus epidermidis NOT DETECTED NOT DETECTED Final   Staphylococcus lugdunensis NOT DETECTED NOT DETECTED Final   Streptococcus species NOT DETECTED NOT DETECTED Final   Streptococcus agalactiae NOT DETECTED NOT DETECTED Final   Streptococcus pneumoniae NOT DETECTED NOT DETECTED Final   Streptococcus pyogenes NOT DETECTED NOT DETECTED Final   A.calcoaceticus-baumannii NOT DETECTED NOT DETECTED Final   Bacteroides fragilis NOT DETECTED NOT DETECTED Final   Enterobacterales NOT DETECTED NOT DETECTED Final   Enterobacter cloacae  complex NOT DETECTED NOT DETECTED Final   Escherichia coli NOT DETECTED NOT DETECTED Final   Klebsiella aerogenes NOT DETECTED NOT DETECTED Final   Klebsiella oxytoca NOT DETECTED NOT DETECTED Final   Klebsiella pneumoniae NOT DETECTED NOT DETECTED Final   Proteus species NOT DETECTED NOT DETECTED Final   Salmonella species NOT DETECTED NOT DETECTED Final   Serratia marcescens NOT DETECTED NOT DETECTED Final   Haemophilus influenzae NOT DETECTED NOT DETECTED Final   Neisseria meningitidis NOT DETECTED NOT DETECTED Final   Pseudomonas aeruginosa NOT DETECTED NOT DETECTED Final   Stenotrophomonas maltophilia NOT DETECTED NOT DETECTED Final   Candida albicans NOT DETECTED NOT DETECTED Final   Candida auris NOT DETECTED NOT DETECTED Final   Candida glabrata NOT DETECTED NOT DETECTED Final   Candida krusei NOT DETECTED NOT DETECTED Final   Candida parapsilosis NOT DETECTED NOT DETECTED Final   Candida tropicalis NOT DETECTED NOT DETECTED Final   Cryptococcus neoformans/gattii NOT DETECTED NOT DETECTED Final   Vancomycin resistance NOT DETECTED NOT DETECTED Final    Comment: Performed at [facility], [address] [place], [place] [address]  Culture, blood (Routine X 2) w  Reflex to ID Panel     Status: Abnormal   Collection Time: 06/05/20  1:25 PM   Specimen: BLOOD  Result Value Ref Range Status   Specimen Description   Final    BLOOD LAC Performed at [facility], [address], [place], [place] [address]    Special Requests   Final    BOTTLES DRAWN AEROBIC AND ANAEROBIC Blood Culture adequate volume Performed at [facility], [address] [place], [place] [address]    Culture  Setup Time   Final    Organism ID to follow ANAEROBIC BOTTLE ONLY CRITICAL RESULT CALLED TO, READ BACK BY AND VERIFIED WITH: JASON ROBBINS 06/07/20 AT 1506 BY ACR GRAM POSITIVE COCCI PREVIOUSLY REPORTED AS: GRAM NEGATIVE COCCOBACILLI CORRECTED RESULTS CALLED TO: D,Ziegler  PHARMD, at [place] 06/07/20 BY E. BENTON    Culture (A)  Final    ENTEROCOCCUS FAECALIS SUSCEPTIBILITIES PERFORMED ON PREVIOUS CULTURE WITHIN THE LAST 5 DAYS. Performed at [facility], [place] [address], [place],  [address]    Report Status 06/08/2020 FINAL  Final  Blood Culture ID Panel (Reflexed)     Status: Abnormal   Collection Time: 06/05/20  1:25 PM  Result Value Ref Range Status   Enterococcus faecalis DETECTED (A) NOT DETECTED Final    Comment: CRITICAL RESULT CALLED TO, READ BACK BY AND VERIFIED WITH: JASON ROBBINS 06/07/20 AT 1506 BY ACR    Enterococcus Faecium NOT DETECTED NOT DETECTED Final   Listeria monocytogenes NOT DETECTED NOT DETECTED Final   Staphylococcus species NOT DETECTED NOT DETECTED Final   Staphylococcus aureus (BCID) NOT DETECTED NOT DETECTED Final   Staphylococcus epidermidis NOT DETECTED NOT DETECTED Final   Staphylococcus lugdunensis NOT DETECTED NOT DETECTED Final   Streptococcus species NOT DETECTED NOT DETECTED Final   Streptococcus agalactiae NOT DETECTED NOT DETECTED Final   Streptococcus pneumoniae NOT DETECTED NOT DETECTED Final   Streptococcus pyogenes NOT DETECTED NOT DETECTED Final   A.calcoaceticus-baumannii NOT DETECTED NOT DETECTED Final   Bacteroides fragilis NOT DETECTED NOT DETECTED Final   Enterobacterales NOT DETECTED NOT DETECTED Final   Enterobacter cloacae complex NOT DETECTED NOT DETECTED Final   Escherichia coli NOT DETECTED NOT DETECTED Final   Klebsiella aerogenes NOT DETECTED NOT DETECTED Final   Klebsiella oxytoca NOT DETECTED NOT DETECTED Final   Klebsiella pneumoniae NOT DETECTED NOT DETECTED Final   Proteus species NOT DETECTED NOT DETECTED Final   Salmonella species NOT DETECTED NOT DETECTED Final   Serratia marcescens NOT DETECTED NOT DETECTED Final   Haemophilus influenzae NOT DETECTED NOT DETECTED Final   Neisseria meningitidis NOT DETECTED NOT DETECTED Final   Pseudomonas aeruginosa NOT DETECTED NOT DETECTED  Final   Stenotrophomonas maltophilia NOT DETECTED NOT DETECTED Final   Candida albicans NOT DETECTED NOT DETECTED Final   Candida auris NOT DETECTED NOT DETECTED Final   Candida glabrata NOT DETECTED NOT DETECTED Final   Candida krusei NOT DETECTED NOT DETECTED Final   Candida parapsilosis NOT DETECTED NOT DETECTED Final   Candida tropicalis NOT DETECTED NOT DETECTED Final   Cryptococcus neoformans/gattii NOT DETECTED NOT DETECTED Final   Vancomycin resistance NOT DETECTED NOT DETECTED Final    Comment: Performed at [facility], [address] [place], [place] [address]  CULTURE, BLOOD (ROUTINE X 2) w Reflex to ID Panel     Status: None   Collection Time: 06/07/20  7:02 PM   Specimen: BLOOD  Result Value Ref Range Status   Specimen Description BLOOD BLOOD LEFT  FOREARM  Final   Special Requests   Final    BOTTLES DRAWN AEROBIC AND ANAEROBIC Blood Culture results may not be optimal due to an inadequate volume of blood received in culture bottles   Culture   Final    NO GROWTH 5 DAYS Performed at [facility], [address] [place], [place] [address]    Report Status 06/12/2020 FINAL  Final  CULTURE, BLOOD (ROUTINE X 2) w Reflex to ID Panel     Status: None   Collection Time: 06/07/20  8:46 PM   Specimen: BLOOD  Result Value Ref Range Status   Specimen Description BLOOD BLOOD LEFT HAND  Final   Special Requests   Final    BOTTLES DRAWN AEROBIC ONLY Blood Culture results may not be optimal due to an inadequate volume of blood received in culture bottles   Culture   Final    NO GROWTH 5 DAYS Performed at [facility], [address] [place], [place] [address]    Report Status 06/12/2020 FINAL  Final  MRSA PCR Screening     Status: None   Collection Time: 06/11/20 10:40 AM   Specimen: Nasopharyngeal  Result Value Ref Range Status   MRSA by PCR NEGATIVE NEGATIVE Final    Comment:        The GeneXpert MRSA Assay (FDA approved for NASAL  specimens only), is one component of a comprehensive MRSA colonization surveillance program. It is not intended to diagnose MRSA infection nor to guide or monitor treatment for MRSA infections. Performed at [facility], [address] [place], [place] [address]           IMAGING    MR BRAIN WO CONTRAST  Result Date: 06/13/2020 CLINICAL DATA:  Follow-up examination for stroke. EXAM: MRI HEAD WITHOUT CONTRAST TECHNIQUE: Multiplanar, multiecho pulse sequences of the brain and surrounding structures were obtained without intravenous contrast. COMPARISON:  Prior head CT from 06/11/2020. FINDINGS: Brain: Cerebral volume within normal limits for age. Mild hazy T2/FLAIR hyperintensity noted within the periventricular white matter, nonspecific, but most like related chronic microvascular ischemic disease, felt to be within normal limits for age. Few small remote lacunar infarcts noted within the pons. No abnormal foci of restricted diffusion to suggest acute or subacute ischemia. Gray-white matter differentiation maintained. No encephalomalacia to suggest chronic cortical infarction. Single punctate chronic microhemorrhage noted within the left cerebellum, of doubtful significance in isolation. No other foci of susceptibility artifact to suggest acute or chronic intracranial hemorrhage. No mass lesion, midline shift or mass effect. No hydrocephalus or extra-axial fluid collection. Incidental note made of a partially empty sella. Midline structures intact. Vascular: Major intracranial vascular flow voids are maintained. Skull and upper cervical spine: Craniocervical junction within normal limits. Diffusely decreased T1 signal intensity seen within the visualized bone marrow, nonspecific, but most commonly related to anemia, smoking, or obesity. No focal marrow replacing lesion. No scalp soft tissue abnormality. Sinuses/Orbits: Patient status post bilateral ocular lens replacement. Globes and  orbital soft tissues demonstrate no acute finding. Mild mucosal thickening noted within the left ethmoidal air cells. Paranasal sinuses are otherwise largely clear. Fluid noted layering within the nasopharynx. Patient is intubated. No significant mastoid effusion. Other: None. IMPRESSION: 1. No acute intracranial abnormality. 2. Few small remote lacunar infarcts involving the pons. 3. Otherwise normal brain MRI for age. Electronically Signed   By: Jeannine Boga M.D.   On: 06/13/2020 02:08       ASSESSMENT AND PLAN SYNOPSIS 59 yo  obese female with multiple medical issues with enterococcus bacteremia s/p TEE with post op complication with near cardiac arrest with acute severe resp failure   Severe ACUTE Hypoxic and Hypercapnic Respiratory Failure due to acute hypotension and near cardiac arrest due to sedative medications with underlying multiple medical conditions with severe sepsis and bacteremia   Severe ACUTE Hypoxic and Hypercapnic Respiratory Failure due  -continue Full MV support -continue Bronchodilator Therapy -Wean Fio2 and PEEP as tolerated -will perform SAT/SBT when respiratory parameters are met -VAP/VENT bundle implementation  ACUTE DIASTOLIC CARDIAC FAILURE-  Morbid obesity, possible OSA.   Will certainly impact respiratory mechanics, ventilator weaning Suspect will need to consider additional PEEP  KIDNEY INJURY/Renal Failure -continue Foley Catheter-assess need -Avoid nephrotoxic agents -Follow urine output, BMP -Ensure adequate renal perfusion, optimize oxygenation -Renal dose medications HD as needed     NEUROLOGY - intubated and sedated - minimal sedation to achieve a RASS goal: -1 Wake up assessment pending Plan for wake up assessment and neuro examination MRI essentially no massive CVA   CARDIAC ICU monitoring  ID + ENTEROCOCCUS BACTEREMIA -continue IV abx as prescibed -follow up cultures TEE NEG  GI GI PROPHYLAXIS as  indicated  ENDO - will use ICU hypoglycemic\Hyperglycemia protocol if indicated     ELECTROLYTES -follow labs as needed -replace as needed -pharmacy consultation and following   DVT/GI PRX ordered and assessed TRANSFUSIONS AS NEEDED MONITOR FSBS I Assessed the need for Labs I Assessed the need for Foley I Assessed the need for Central Venous Line Family Discussion when available I Assessed the need for Mobilization I made an Assessment of medications to be adjusted accordingly Safety Risk assessment completed   CASE DISCUSSED IN MULTIDISCIPLINARY ROUNDS WITH ICU TEAM  Critical Care Time devoted to patient care services described in this note is 34 minutes.   Overall, patient is critically ill, prognosis is guarded.    Corrin Parker, M.D.  [facility]  Medical Director [place] Director [facility] Cardio-Pulmonary Department

## 2020-06-13 NOTE — Progress Notes (Signed)
[facility]  ROUNDING NOTE   Subjective:   Remains intubated. Continues to have bradycardia.  Hemodialysis treatment yesterday. Tolerated treatment well. UF of 1 liter.  Afebrile.  Scheduled for surgery today.    Objective:  Vital signs in last 24 hours:  Temp:  [98.6 F (37 C)-98.9 F (37.2 C)] 98.9 F (37.2 C) (09/22 0500) Pulse Rate:  [49-64] 52 (09/22 0800) Resp:  [0-18] 18 (09/22 0800) BP: (123-171)/(45-87) 139/51 (09/22 0800) SpO2:  [99 %-100 %] 99 % (09/22 0800) FiO2 (%):  [21 %-24 %] 21 % (09/22 0736) Weight:  [76.3 kg] 76.3 kg (09/22 0500)  Weight change: -10.1 kg Filed Weights   06/11/20 1039 06/12/20 0432 06/13/20 0500  Weight: 82.6 kg 81.4 kg 76.3 kg    Intake/Output: I/O last 3 completed shifts: In: 961.4 [I.V.:462; IV Piggyback:499.4] Out: 1000 [Other:1000]   Intake/Output this shift:  No intake/output data recorded.  Physical Exam: General: Critically ill  Head: ETT, no OGT  Eyes: Anicteric  Neck: trachea midline  Lungs:  PRVC fIO2 24%  Heart: bradycardia  Abdomen:  Soft,  Extremities:  no peripheral edema.  Neurologic: Intubated and sedated  Skin: No lesions  Access: Right Hero graft, tender and erythematous swollen area. Right femoral temporary HD catheter     Basic Metabolic Panel: Recent Labs  Lab 06/08/20 1722 06/10/20 2119 06/10/20 2119 06/11/20 0648 06/11/20 0648 06/11/20 1431 06/12/20 0425 06/13/20 0532  NA  --  135  --  130*  --  134* 134* 136  138  K  --  2.9*  --  3.3*  --  4.2 3.4* 3.4*  3.3*  CL  --  96*  --  94*  --  96* 97* 97*  96*  CO2  --  27  --  26  --  _0 GLUCOSE  --  98  --  115*  --  121* 91 83  88  BUN  --  12  --  14  --  _1 CREATININE  --  4.04*  --  4.87*  --  5.28* 5.53* 4.39*  4.36*  CALCIUM  --  7.8*   < > 7.9*   < > 8.3* 8.3* 7.6*  7.9*  MG  --   --   --   --   --   --  1.7 1.8  PHOS 1.9* 1.5*  --   --   --   --  1.9* 3.2   < > = values in this interval  not displayed.    Liver Function Tests: Recent Labs  Lab 06/10/20 2119 06/12/20 0425 06/13/20 0532  ALBUMIN 2.7* 2.8* 2.4*   No results for input(s): LIPASE, AMYLASE in the last 168 hours. No results for input(s): AMMONIA in the last 168 hours.  CBC: Recent Labs  Lab 06/10/20 2119 06/12/20 0123 06/12/20 0425 06/13/20 0532  WBC 4.6 10.9* 10.1 12.0*  HGB 8.4* 9.2* 9.0* 10.2*  HCT 25.3* 29.6* 27.7* 30.5*  MCV 86.6 92.5 88.8 87.4  PLT 187 151 195 209    Cardiac Enzymes: No results for input(s): CKTOTAL, CKMB, CKMBINDEX, TROPONINI in the last 168 hours.  BNP: Invalid input(s): POCBNP  CBG: Recent Labs  Lab 06/12/20 1950 06/13/20 0011 06/13/20 0346 06/13/20 0746 06/13/20 0823  GLUCAP 91 99 93 61* 118*    Microbiology: Results for orders placed or performed during the hospital encounter of 06/02/20  SARS Coronavirus 2 by RT PCR (  hospital order, performed in [facility] hospital lab) Nasopharyngeal Nasopharyngeal Swab     Status: None   Collection Time: 06/03/20  6:49 AM   Specimen: Nasopharyngeal Swab  Result Value Ref Range Status   SARS Coronavirus 2 NEGATIVE NEGATIVE Final    Comment: (NOTE) SARS-CoV-2 target nucleic acids are NOT DETECTED.  The SARS-CoV-2 RNA is generally detectable in upper and lower respiratory specimens during the acute phase of infection. The lowest concentration of SARS-CoV-2 viral copies this assay can detect is 250 copies / mL. A negative result does not preclude SARS-CoV-2 infection and should not be used as the sole basis for treatment or other patient management decisions.  A negative result may occur with improper specimen collection / handling, submission of specimen other than nasopharyngeal swab, presence of viral mutation(s) within the areas targeted by this assay, and inadequate number of viral copies (<250 copies / mL). A negative result must be combined with clinical observations, patient history, and epidemiological  information.  Fact Sheet for Patients:   StrictlyIdeas.no  Fact Sheet for Healthcare Providers: BankingDealers.co.za  This test is not yet approved or  cleared by the [place] FDA and has been authorized for detection and/or diagnosis of SARS-CoV-2 by FDA under an Emergency Use Authorization (EUA).  This EUA will remain in effect (meaning this test can be used) for the duration of the COVID-19 declaration under Section 564(b)(1) of the Act, 21 U.S.C. section 360bbb-3(b)(1), unless the authorization is terminated or revoked sooner.  Performed at [facility], [address] [place], [place] [address]   Gastrointestinal Panel by PCR , Stool     Status: None   Collection Time: 06/04/20  7:39 PM   Specimen: Stool  Result Value Ref Range Status   Campylobacter species NOT DETECTED NOT DETECTED Final   Plesimonas shigelloides NOT DETECTED NOT DETECTED Final   Salmonella species NOT DETECTED NOT DETECTED Final   Yersinia enterocolitica NOT DETECTED NOT DETECTED Final   Vibrio species NOT DETECTED NOT DETECTED Final   Vibrio cholerae NOT DETECTED NOT DETECTED Final   Enteroaggregative E coli (EAEC) NOT DETECTED NOT DETECTED Final   Enteropathogenic E coli (EPEC) NOT DETECTED NOT DETECTED Final   Enterotoxigenic E coli (ETEC) NOT DETECTED NOT DETECTED Final   Shiga like toxin producing E coli (STEC) NOT DETECTED NOT DETECTED Final   Shigella/Enteroinvasive E coli (EIEC) NOT DETECTED NOT DETECTED Final   Cryptosporidium NOT DETECTED NOT DETECTED Final   Cyclospora cayetanensis NOT DETECTED NOT DETECTED Final   Entamoeba histolytica NOT DETECTED NOT DETECTED Final   Giardia lamblia NOT DETECTED NOT DETECTED Final   Adenovirus F40/41 NOT DETECTED NOT DETECTED Final   Astrovirus NOT DETECTED NOT DETECTED Final   Norovirus GI/GII NOT DETECTED NOT DETECTED Final   Rotavirus A NOT DETECTED NOT DETECTED Final   Sapovirus (I, II,  IV, and V) NOT DETECTED NOT DETECTED Final    Comment: Performed at [facility], [address] [place], [place] [address]  Culture, blood (Routine X 2) w Reflex to ID Panel     Status: Abnormal   Collection Time: 06/04/20  8:49 PM   Specimen: BLOOD  Result Value Ref Range Status   Specimen Description   Final    BLOOD BLOOD LEFT FOREARM Performed at [facility], [address] [place],  [address]    Special Requests   Final    BOTTLES DRAWN AEROBIC AND ANAEROBIC Blood Culture adequate volume Performed at [facility], [address] [place], [place] [address]    Culture  Setup Time   Final    IN BOTH AEROBIC AND ANAEROBIC BOTTLES GRAM POSITIVE COCCI Organism ID to follow CRITICAL RESULT CALLED TO, READ BACK BY AND VERIFIED WITH: CHARLES SHANLEVER $[email or web address]$  06/06/20 MJU LISA KLUTTZ AT 4580 ON 06/07/2020 Burns. Performed at [facility], [place] [address], [place], [place] [address]    Culture ENTEROCOCCUS FAECALIS (A)  Final   Report Status 06/08/2020 FINAL  Final   Organism ID, Bacteria ENTEROCOCCUS FAECALIS  Final      Susceptibility   Enterococcus faecalis - MIC*    AMPICILLIN <=2 SENSITIVE Sensitive     VANCOMYCIN 1 SENSITIVE Sensitive     GENTAMICIN SYNERGY SENSITIVE Sensitive     * ENTEROCOCCUS FAECALIS  Blood Culture ID Panel (Reflexed)     Status: Abnormal   Collection Time: 06/04/20  8:49 PM  Result Value Ref Range Status   Enterococcus faecalis DETECTED (A) NOT DETECTED Final    Comment: CRITICAL RESULT CALLED TO, READ BACK BY AND VERIFIED WITH: CHARLES SHANLEVER $[email or web address]$  06/06/20 MJU    Enterococcus Faecium NOT DETECTED NOT DETECTED Final   Listeria monocytogenes NOT DETECTED NOT DETECTED Final   Staphylococcus species NOT DETECTED NOT DETECTED Final   Staphylococcus aureus (BCID) NOT DETECTED NOT DETECTED Final   Staphylococcus epidermidis NOT DETECTED NOT DETECTED Final   Staphylococcus lugdunensis NOT DETECTED NOT DETECTED  Final   Streptococcus species NOT DETECTED NOT DETECTED Final   Streptococcus agalactiae NOT DETECTED NOT DETECTED Final   Streptococcus pneumoniae NOT DETECTED NOT DETECTED Final   Streptococcus pyogenes NOT DETECTED NOT DETECTED Final   A.calcoaceticus-baumannii NOT DETECTED NOT DETECTED Final   Bacteroides fragilis NOT DETECTED NOT DETECTED Final   Enterobacterales NOT DETECTED NOT DETECTED Final   Enterobacter cloacae complex NOT DETECTED NOT DETECTED Final   Escherichia coli NOT DETECTED NOT DETECTED Final   Klebsiella aerogenes NOT DETECTED NOT DETECTED Final   Klebsiella oxytoca NOT DETECTED NOT DETECTED Final   Klebsiella pneumoniae NOT DETECTED NOT DETECTED Final   Proteus species NOT DETECTED NOT DETECTED Final   Salmonella species NOT DETECTED NOT DETECTED Final   Serratia marcescens NOT DETECTED NOT DETECTED Final   Haemophilus influenzae NOT DETECTED NOT DETECTED Final   Neisseria meningitidis NOT DETECTED NOT DETECTED Final   Pseudomonas aeruginosa NOT DETECTED NOT DETECTED Final   Stenotrophomonas maltophilia NOT DETECTED NOT DETECTED Final   Candida albicans NOT DETECTED NOT DETECTED Final   Candida auris NOT DETECTED NOT DETECTED Final   Candida glabrata NOT DETECTED NOT DETECTED Final   Candida krusei NOT DETECTED NOT DETECTED Final   Candida parapsilosis NOT DETECTED NOT DETECTED Final   Candida tropicalis NOT DETECTED NOT DETECTED Final   Cryptococcus neoformans/gattii NOT DETECTED NOT DETECTED Final   Vancomycin resistance NOT DETECTED NOT DETECTED Final    Comment: Performed at [facility], [address] [place], [name] [address]  Culture, blood (Routine X 2) w Reflex to ID Panel     Status: Abnormal   Collection Time: 06/05/20  1:25 PM   Specimen: BLOOD  Result Value Ref Range Status   Specimen Description   Final    BLOOD LAC Performed at [facility], [address] [place], [place] [address]    Special Requests   Final     BOTTLES DRAWN AEROBIC AND ANAEROBIC Blood Culture adequate volume Performed at [facility], [address], [place], [place] [address]    Culture  Setup Time   Final    Organism ID to follow ANAEROBIC BOTTLE ONLY CRITICAL RESULT CALLED TO, READ BACK BY AND VERIFIED WITH: JASON ROBBINS 06/07/20 AT 1506 BY ACR GRAM POSITIVE COCCI PREVIOUSLY REPORTED AS: GRAM NEGATIVE COCCOBACILLI CORRECTED RESULTS CALLED TO: D,Ziegler PHARMD, at [place] 06/07/20 BY E. BENTON    Culture (A)  Final    ENTEROCOCCUS FAECALIS SUSCEPTIBILITIES PERFORMED ON PREVIOUS CULTURE WITHIN THE LAST 5 DAYS. Performed at [facility], [place] [address], [place], [place] [address]    Report Status 06/08/2020 FINAL  Final  Blood Culture ID Panel (Reflexed)     Status: Abnormal   Collection Time: 06/05/20  1:25 PM  Result Value Ref Range Status   Enterococcus faecalis DETECTED (A) NOT DETECTED Final    Comment: CRITICAL RESULT CALLED TO, READ BACK BY AND VERIFIED WITH: JASON ROBBINS 06/07/20 AT 1506 BY ACR    Enterococcus Faecium NOT DETECTED NOT DETECTED Final   Listeria monocytogenes NOT DETECTED NOT DETECTED Final   Staphylococcus species NOT DETECTED NOT DETECTED Final   Staphylococcus aureus (BCID) NOT DETECTED NOT DETECTED Final   Staphylococcus epidermidis NOT DETECTED NOT DETECTED Final   Staphylococcus lugdunensis NOT DETECTED NOT DETECTED Final   Streptococcus species NOT DETECTED NOT DETECTED Final   Streptococcus agalactiae NOT DETECTED NOT DETECTED Final   Streptococcus pneumoniae NOT DETECTED NOT DETECTED Final   Streptococcus pyogenes NOT DETECTED NOT DETECTED Final   A.calcoaceticus-baumannii NOT DETECTED NOT DETECTED Final   Bacteroides fragilis NOT DETECTED NOT DETECTED Final   Enterobacterales NOT DETECTED NOT DETECTED Final   Enterobacter cloacae complex NOT DETECTED NOT DETECTED Final   Escherichia coli NOT DETECTED NOT DETECTED Final   Klebsiella aerogenes NOT DETECTED NOT DETECTED  Final   Klebsiella oxytoca NOT DETECTED NOT DETECTED Final   Klebsiella pneumoniae NOT DETECTED NOT DETECTED Final   Proteus species NOT DETECTED NOT DETECTED Final   Salmonella species NOT DETECTED NOT DETECTED Final   Serratia marcescens NOT DETECTED NOT DETECTED Final   Haemophilus influenzae NOT DETECTED NOT DETECTED Final   Neisseria meningitidis NOT DETECTED NOT DETECTED Final   Pseudomonas aeruginosa NOT DETECTED NOT DETECTED Final   Stenotrophomonas maltophilia NOT DETECTED NOT DETECTED Final   Candida albicans NOT DETECTED NOT DETECTED Final   Candida auris NOT DETECTED NOT DETECTED Final   Candida glabrata NOT DETECTED NOT DETECTED Final   Candida krusei NOT DETECTED NOT DETECTED Final   Candida parapsilosis NOT DETECTED NOT DETECTED Final   Candida tropicalis NOT DETECTED NOT DETECTED Final   Cryptococcus neoformans/gattii NOT DETECTED NOT DETECTED Final   Vancomycin resistance NOT DETECTED NOT DETECTED Final    Comment: Performed at [facility], [address] [place], [place] [address]  CULTURE, BLOOD (ROUTINE X 2) w Reflex to ID Panel     Status: None   Collection Time: 06/07/20  7:02 PM   Specimen: BLOOD  Result Value Ref Range Status   Specimen Description BLOOD BLOOD LEFT FOREARM  Final   Special Requests   Final    BOTTLES DRAWN AEROBIC AND ANAEROBIC Blood Culture results may not be optimal due to an inadequate volume of blood received in culture bottles   Culture   Final    NO GROWTH 5 DAYS Performed at [facility], [address] [place],  [address]    Report Status 06/12/2020 FINAL  Final  CULTURE, BLOOD (ROUTINE X 2) w Reflex to ID Panel     Status: None   Collection  Time: 06/07/20  8:46 PM   Specimen: BLOOD  Result Value Ref Range Status   Specimen Description BLOOD BLOOD LEFT HAND  Final   Special Requests   Final    BOTTLES DRAWN AEROBIC ONLY Blood Culture results may not be optimal due to an inadequate volume of blood  received in culture bottles   Culture   Final    NO GROWTH 5 DAYS Performed at [facility], [address] [place], [place] [address]    Report Status 06/12/2020 FINAL  Final  MRSA PCR Screening     Status: None   Collection Time: 06/11/20 10:40 AM   Specimen: Nasopharyngeal  Result Value Ref Range Status   MRSA by PCR NEGATIVE NEGATIVE Final    Comment:        The GeneXpert MRSA Assay (FDA approved for NASAL specimens only), is one component of a comprehensive MRSA colonization surveillance program. It is not intended to diagnose MRSA infection nor to guide or monitor treatment for MRSA infections. Performed at [facility], [address] [place], [place] [address]     Coagulation Studies: Recent Labs    06/13/20 0532  LABPROT 15.3*  INR 1.3*    Urinalysis: No results for input(s): COLORURINE, LABSPEC, PHURINE, GLUCOSEU, HGBUR, BILIRUBINUR, KETONESUR, PROTEINUR, UROBILINOGEN, NITRITE, LEUKOCYTESUR in the last 72 hours.  Invalid input(s): APPERANCEUR    Imaging: MR BRAIN WO CONTRAST  Result Date: 06/13/2020 CLINICAL DATA:  Follow-up examination for stroke. EXAM: MRI HEAD WITHOUT CONTRAST TECHNIQUE: Multiplanar, multiecho pulse sequences of the brain and surrounding structures were obtained without intravenous contrast. COMPARISON:  Prior head CT from 06/11/2020. FINDINGS: Brain: Cerebral volume within normal limits for age. Mild hazy T2/FLAIR hyperintensity noted within the periventricular white matter, nonspecific, but most like related chronic microvascular ischemic disease, felt to be within normal limits for age. Few small remote lacunar infarcts noted within the pons. No abnormal foci of restricted diffusion to suggest acute or subacute ischemia. Gray-white matter differentiation maintained. No encephalomalacia to suggest chronic cortical infarction. Single punctate chronic microhemorrhage noted within the left cerebellum, of doubtful  significance in isolation. No other foci of susceptibility artifact to suggest acute or chronic intracranial hemorrhage. No mass lesion, midline shift or mass effect. No hydrocephalus or extra-axial fluid collection. Incidental note made of a partially empty sella. Midline structures intact. Vascular: Major intracranial vascular flow voids are maintained. Skull and upper cervical spine: Craniocervical junction within normal limits. Diffusely decreased T1 signal intensity seen within the visualized bone marrow, nonspecific, but most commonly related to anemia, smoking, or obesity. No focal marrow replacing lesion. No scalp soft tissue abnormality. Sinuses/Orbits: Patient status post bilateral ocular lens replacement. Globes and orbital soft tissues demonstrate no acute finding. Mild mucosal thickening noted within the left ethmoidal air cells. Paranasal sinuses are otherwise largely clear. Fluid noted layering within the nasopharynx. Patient is intubated. No significant mastoid effusion. Other: None. IMPRESSION: 1. No acute intracranial abnormality. 2. Few small remote lacunar infarcts involving the pons. 3. Otherwise normal brain MRI for age. Electronically Signed   By: Jeannine Boga M.D.   On: 06/13/2020 02:08   DG Chest Port 1 View  Result Date: 06/12/2020 CLINICAL DATA:  Acute respiratory failure EXAM: PORTABLE CHEST 1 VIEW COMPARISON:  None. FINDINGS: Endotracheal tube is been placed with its tip 5.1 cm above the carina. Right internal jugular HeRO graft venous limb is seen with its tip within the right atrium. Left upper extremity central venous stenting has been performed. Minimal  left basilar atelectasis. Lungs are otherwise clear. No pneumothorax or pleural effusion. Cardiac size within normal limits. Pulmonary vascularity is normal. IMPRESSION: No active disease. Electronically Signed   By: Fidela Salisbury MD   On: 06/12/2020 05:18     Medications:   . ampicillin (OMNIPEN) IV 2 g (06/13/20  0955)  . famotidine (PEPCID) IV 20 mg (06/13/20 1017)  . gentamicin    . propofol (DIPRIVAN) infusion 30 mcg/kg/min (06/13/20 0600)  . vancomycin     . aspirin EC  81 mg Oral Daily  . atorvastatin  20 mg Oral Daily  . chlorhexidine gluconate (MEDLINE KIT)  15 mL Mouth Rinse BID  . Chlorhexidine Gluconate Cloth  6 each Topical Daily  . cholestyramine light  4 g Oral BID  . dextrose  1 ampule Intravenous Once  . epoetin (EPOGEN/PROCRIT) injection  10,000 Units Intravenous Q T,Th,Sa-HD  . ferrous sulfate  325 mg Oral Daily  . gabapentin  100 mg Oral QPM  . heparin  5,000 Units Subcutaneous Q8H  . hydrOXYzine  25 mg Oral Daily  . lipase/protease/amylase  12,000 Units Oral TID WC  . mouth rinse  15 mL Mouth Rinse 10 times per day  . multivitamin  1 tablet Oral Daily  . cyanocobalamin  1,000 mcg Oral Daily   acetaminophen **OR** acetaminophen, calcium carbonate, camphor-menthol **AND** hydrOXYzine, feeding supplement (NEPRO CARB STEADY), hydrALAZINE, ipratropium-albuterol, loperamide, morphine injection, naLOXone (NARCAN)  injection, ondansetron **OR** ondansetron (ZOFRAN) IV, oxyCODONE-acetaminophen, promethazine, sorbitol, zolpidem  Assessment/ Plan:  [name] is a 59 y.o. black female with ESRD on HD MWF, hypertension, COPD, diastolic heart failure, depression, diabetes mellitus type 2, hyperlipidemia, pancreatic insufficiency with new dialysis access placement 04/12/2020 who is   [facility] MWF [facility] 81kg.left AVG  1. ESRD on HD MWF  Hemodialysis treatment yesterday.  - Patient had right femoral temp dialysis catheter placed 8/85 - Complication of dialysis device: appreciate vascular input.   2. Anemia of chronic kidney disease.hemoglobin 10.2 Continue epogen 10000 units IV with HD.  3. Secondary hyperparathyroidism. Holding home sevelamer  With hypophosphatemia - required IV replacement.   4. Enterococcal bacetermia: On ampicillin and gentamicin. TEE  negative for vegetations - source may be from HERO graft  - appreciate vascular, ID and critical care input.   5. Hypertension: with bradycardia - PRN hydralazine.  - discontinued carvedilol   LOS: 10 [name] 9/22/202110:59 AM

## 2020-06-13 NOTE — Transfer of Care (Signed)
Immediate Anesthesia Transfer of Care Note  Patient: [name]  Procedure(s) Performed: LIGATION / REMOVAL OF RIGHT HERO GRAFT (Right Arm Lower)  Patient Location: ICU  Anesthesia Type:General  Level of Consciousness: sedated and Patient remains intubated per anesthesia plan  Airway & Oxygen Therapy: Patient remains intubated per anesthesia plan  Post-op Assessment: Report given to RN and Post -op Vital signs reviewed and stable  Post vital signs: Reviewed and stable  Last Vitals:  Vitals Value Taken Time  BP    Temp    Pulse    Resp    SpO2      Last Pain:  Vitals:   06/13/20 0500  TempSrc: Oral  PainSc:       Patients Stated Pain Goal: 0 (94/32/00 3794)  Complications: No complications documented.

## 2020-06-13 NOTE — Consult Note (Signed)
Pharmacy Antibiotic Note  [name] is a 59 y.o. female admitted on 06/02/2020 with respiratory failure secondary to acute diastolic CHF and COPD exacerbation. PMH includes ESRD on HD, HTN, DM, CAD, PCD.  Pharmacy has been consulted for ampicillin and gentamicin dosing.   Patient with ESRD on HD with numerous graft complications. Erythemous firm swelling in right cubital area of graft arm.  Graft site noted as possible source of bloodstream infection, removed 9/19. Patient currently in OR for fistula removal.   Gentamicin level obtained 9/21 at 0425 and resulted as 3 (goal peak: 3-4 mg/L, goal trough <1) s/p LD of 100mg  9/17 at 1800 and MD of 80 mg 9/20 at 0026 w/ one 2 hour dialysis session on 9/19 with BFR 250 (max: 400).   Full dialysis session completed 9/21 with BFR 400. Expect a trough of 1-1.5 considering previous level. Decreased dose of gentamicin 60mg  administered 9/21 post-HD, expecting to yield level within goal of 3-4. Patient will not be dialyzed today, 9/21 considering surgery.   TTE 9/16: moderate tricuspid valve regurgitation, no vegetation visualized  TEE 9/20: mild to moderate tricuspid valve regurgitation, no vegetation visualized. Procedure complicated by hypotension, respiratory depression, and bradycardia.    Cultures this admission:  BCx 9/13: Enterococcus faecalis, amp sensitive, gent synergy sensitive BCx 9/14: Enterococcus faecalis  BCID 9/14: Enterococcus faecalis detected  BCx 9/16: NGTD in 4/4 bottles  Plan:  Hold dose of gentamicin today considering no dialysis   Continue gentamicin maintenance dose of 80mg  following full dialysis session (will follow HD sessions)  Monitor gentamicin levels weekly. Monitor signs of ototoxicity (I.e. ringing in ears daily during therapy)     Continue ampicillin 2g IV every 12 hours   Height: 5\' 5"  (165.1 cm) Weight: 76.3 kg (168 lb 3.4 oz) IBW/kg (Calculated) : 57  Temp (24hrs), Avg:98.7 F (37.1 C), Min:98.5 F  (36.9 C), Max:98.9 F (37.2 C)  Recent Labs  Lab 06/10/20 2119 06/11/20 0648 06/11/20 1431 06/12/20 0123 06/12/20 0425 06/13/20 0532  WBC 4.6  --   --  10.9* 10.1 12.0*  CREATININE 4.04* 4.87* 5.28*  --  5.53* 4.39*  4.36*  GENTRANDOM  --   --   --   --  3.0  --     Estimated Creatinine Clearance: 13.7 mL/min (A) (by C-G formula based on SCr of 4.39 mg/dL (H)).    Allergies  Allergen Reactions  . Ace Inhibitors Swelling and Anaphylaxis  . Ativan [Lorazepam] Other (See Comments)    Reaction:Hallucinations and headaches  . Compazine [Prochlorperazine Edisylate] Anaphylaxis, Nausea And Vomiting and Other (See Comments)    Other reaction(s): dystonia from this vs. Reglan, 23 Jul - patient relates that she takes promethazine frequently with no problems  . Sumatriptan Succinate Other (See Comments)    Other reaction(s): delirium and hallucinations per [facility] records  . Zofran [Ondansetron] Nausea And Vomiting    Per pt. she is allergic to zofran or will experience adverse reaction like hallucination   . Lac Bovis Nausea And Vomiting  . Losartan Nausea Only  . Prochlorperazine Other (See Comments)    Reaction:  Unknown . Patient does not remember reaction but she does have vertigo and anxiety along with n and v at times. Could be used to treat any of these   . Reglan [Metoclopramide] Other (See Comments)    Per patient her Dr. Evelina Bucy her off it   . Scopolamine Other (See Comments)    Dizziness, also has vertigo already  . Tape  Rash    Plastic tape causes rash  . Tapentadol Rash    Antimicrobials this admission: Azithromycin 9/12 >> 9/15 Ceftriaxone 9/12 >> 9/15  Zosyn 9/15 >> 9/17  Ampicillin 9/17 >>  Gentamicin 9/17 >>     Thank you for allowing pharmacy to be a part of this patient's care.  [name] 06/13/2020 2:58 PM

## 2020-06-13 NOTE — Progress Notes (Signed)
Date of Admission:  06/02/2020     Ampicillin 9/15 Gentamicin  ID: [name] is a 59 y.o. female Principal Problem:   Bacteremia due to Enterococcus Active Problems:   Acute respiratory failure with hypoxia (HCC)   Anemia of chronic disease   Acute diastolic CHF (congestive heart failure) (HCC)   COPD (chronic obstructive pulmonary disease) (HCC)   ESRD on hemodialysis (HCC)   Benign essential HTN   Obesity (BMI 30-39.9)    Subjective: NA 1. Underwent Removal of infected right arm hero AV graft Endarterectomy of the right brachial artery with Cormatrix patch today  Medications:  . aspirin EC  81 mg Oral Daily  . atorvastatin  20 mg Oral Daily  . chlorhexidine gluconate (MEDLINE KIT)  15 mL Mouth Rinse BID  . Chlorhexidine Gluconate Cloth  6 each Topical Daily  . cholestyramine light  4 g Oral BID  . dextrose  1 ampule Intravenous Once  . epoetin (EPOGEN/PROCRIT) injection  10,000 Units Intravenous Q T,Th,Sa-HD  . ferrous sulfate  325 mg Oral Daily  . gabapentin  100 mg Oral QPM  . heparin  5,000 Units Subcutaneous Q8H  . hydrOXYzine  25 mg Oral Daily  . lipase/protease/amylase  12,000 Units Oral TID WC  . mouth rinse  15 mL Mouth Rinse 10 times per day  . multivitamin  1 tablet Oral Daily  . cyanocobalamin  1,000 mcg Oral Daily    Objective: Vital signs in last 24 hours: Temp:  [98.6 F (37 C)-98.9 F (37.2 C)] 98.9 F (37.2 C) (09/22 0500) Pulse Rate:  [49-64] 52 (09/22 0800) Resp:  [0-18] 18 (09/22 0800) BP: (123-171)/(45-87) 139/51 (09/22 0800) SpO2:  [99 %-100 %] 99 % (09/22 0800) FiO2 (%):  [21 %-24 %] 21 % (09/22 0736) Weight:  [76.3 kg] 76.3 kg (09/22 0500)  PHYSICAL EXAM:  General: Intubated Head: Normocephalic, without obvious abnormality, atraumatic. Eyes: Conjunctivae clear, anicteric sclerae. Pupils are equal Lungs: b/l air entry Heart: s1s2  Abdomen: Soft, Extremities: rt arm- surgical dressing not removed Neurologic: cannot be  assessed  Lab Results Recent Labs    06/12/20 0425 06/13/20 0532  WBC 10.1 12.0*  HGB 9.0* 10.2*  HCT 27.7* 30.5*  NA 134* 136  138  K 3.4* 3.4*  3.3*  CL 97* 97*  96*  CO2 _0 BUN _1 CREATININE 5.53* 4.39*  4.36*   Liver Panel Recent Labs    06/12/20 0425 06/13/20 0532  ALBUMIN 2.8* 2.4*   Sedimentation Rate No results for input(s): ESRSEDRATE in the last 72 hours. C-Reactive Protein No results for input(s): CRP in the last 72 hours.  Microbiology:  Studies/Results: MR BRAIN WO CONTRAST  Result Date: 06/13/2020 CLINICAL DATA:  Follow-up examination for stroke. EXAM: MRI HEAD WITHOUT CONTRAST TECHNIQUE: Multiplanar, multiecho pulse sequences of the brain and surrounding structures were obtained without intravenous contrast. COMPARISON:  Prior head CT from 06/11/2020. FINDINGS: Brain: Cerebral volume within normal limits for age. Mild hazy T2/FLAIR hyperintensity noted within the periventricular white matter, nonspecific, but most like related chronic microvascular ischemic disease, felt to be within normal limits for age. Few small remote lacunar infarcts noted within the pons. No abnormal foci of restricted diffusion to suggest acute or subacute ischemia. Gray-white matter differentiation maintained. No encephalomalacia to suggest chronic cortical infarction. Single punctate chronic microhemorrhage noted within the left cerebellum, of doubtful significance in isolation. No other foci of susceptibility artifact to suggest acute or chronic intracranial  hemorrhage. No mass lesion, midline shift or mass effect. No hydrocephalus or extra-axial fluid collection. Incidental note made of a partially empty sella. Midline structures intact. Vascular: Major intracranial vascular flow voids are maintained. Skull and upper cervical spine: Craniocervical junction within normal limits. Diffusely decreased T1 signal intensity seen within the visualized bone marrow,  nonspecific, but most commonly related to anemia, smoking, or obesity. No focal marrow replacing lesion. No scalp soft tissue abnormality. Sinuses/Orbits: Patient status post bilateral ocular lens replacement. Globes and orbital soft tissues demonstrate no acute finding. Mild mucosal thickening noted within the left ethmoidal air cells. Paranasal sinuses are otherwise largely clear. Fluid noted layering within the nasopharynx. Patient is intubated. No significant mastoid effusion. Other: None. IMPRESSION: 1. No acute intracranial abnormality. 2. Few small remote lacunar infarcts involving the pons. 3. Otherwise normal brain MRI for age. Electronically Signed   By: Jeannine Boga M.D.   On: 06/13/2020 02:08   DG Chest Port 1 View  Result Date: 06/12/2020 CLINICAL DATA:  Acute respiratory failure EXAM: PORTABLE CHEST 1 VIEW COMPARISON:  None. FINDINGS: Endotracheal tube is been placed with its tip 5.1 cm above the carina. Right internal jugular HeRO graft venous limb is seen with its tip within the right atrium. Left upper extremity central venous stenting has been performed. Minimal left basilar atelectasis. Lungs are otherwise clear. No pneumothorax or pleural effusion. Cardiac size within normal limits. Pulmonary vascularity is normal. IMPRESSION: No active disease. Electronically Signed   By: Fidela Salisbury MD   On: 06/12/2020 05:18     Assessment/Plan:  Enterococcus bacteremiasource could be the graft site as there is a firm erythematous swelling rt arm.Discussed with vascular team- arterial duplex shows heterogenous collection hematoma vs  Abscess- had removal of AV graft today- currently  on  amp+ gent combo- will DC gent  TEE no endocarditis  repeat blood cultures Neg so far  Bradycardia/hypotension following TEE Intubated  congestive heart failure and pulmonary edema  Vegetation not seen on TEE. 2 d echoshows moderate tricuspid regurg   Accelerated hypertension- management  as per primary team  DM gastroparesis with nausea/vomiting   ESRD on dialysis Anemia due to the above  Thrombocytopenia-resolved  Discussed with care team

## 2020-06-14 ENCOUNTER — Inpatient Hospital Stay: Payer: Medicare HMO

## 2020-06-14 ENCOUNTER — Encounter: Payer: Self-pay | Admitting: Vascular Surgery

## 2020-06-14 DIAGNOSIS — R0600 Dyspnea, unspecified: Secondary | ICD-10-CM

## 2020-06-14 LAB — GLUCOSE, CAPILLARY
Glucose-Capillary: 115 mg/dL — ABNORMAL HIGH (ref 70–99)
Glucose-Capillary: 126 mg/dL — ABNORMAL HIGH (ref 70–99)
Glucose-Capillary: 144 mg/dL — ABNORMAL HIGH (ref 70–99)
Glucose-Capillary: 166 mg/dL — ABNORMAL HIGH (ref 70–99)
Glucose-Capillary: 72 mg/dL (ref 70–99)
Glucose-Capillary: 74 mg/dL (ref 70–99)
Glucose-Capillary: 78 mg/dL (ref 70–99)

## 2020-06-14 LAB — TRIGLYCERIDES: Triglycerides: 120 mg/dL (ref <150)

## 2020-06-14 LAB — CBC
HCT: 26.1 % — ABNORMAL LOW (ref 36.0–46.0)
HCT: 21.6 % — ABNORMAL LOW (ref 36.0–46.0)
Hemoglobin: 9.1 g/dL — ABNORMAL LOW (ref 12.0–15.0)
Hemoglobin: 7.7 g/dL — ABNORMAL LOW (ref 12.0–15.0)
MCH: 28.6 pg (ref 26.0–34.0)
MCH: 29.8 pg (ref 26.0–34.0)
MCHC: 34.9 g/dL (ref 30.0–36.0)
MCHC: 35.6 g/dL (ref 30.0–36.0)
MCV: 82.1 fL (ref 80.0–100.0)
MCV: 83.7 fL (ref 80.0–100.0)
Platelets: 207 K/uL (ref 150–400)
Platelets: 203 10*3/uL (ref 150–400)
RBC: 3.18 MIL/uL — ABNORMAL LOW (ref 3.87–5.11)
RBC: 2.58 MIL/uL — ABNORMAL LOW (ref 3.87–5.11)
RDW: 17.8 % — ABNORMAL HIGH (ref 11.5–15.5)
RDW: 18 % — ABNORMAL HIGH (ref 11.5–15.5)
WBC: 26.9 K/uL — ABNORMAL HIGH (ref 4.0–10.5)
WBC: 31.8 10*3/uL — ABNORMAL HIGH (ref 4.0–10.5)
nRBC: 0 % (ref 0.0–0.2)
nRBC: 0 % (ref 0.0–0.2)

## 2020-06-14 LAB — RENAL FUNCTION PANEL
Albumin: 2.3 g/dL — ABNORMAL LOW (ref 3.5–5.0)
Albumin: 2.3 g/dL — ABNORMAL LOW (ref 3.5–5.0)
Anion gap: 17 — ABNORMAL HIGH (ref 5–15)
Anion gap: 13 (ref 5–15)
BUN: 20 mg/dL (ref 8–23)
BUN: 8 mg/dL (ref 8–23)
CO2: 20 mmol/L — ABNORMAL LOW (ref 22–32)
CO2: 29 mmol/L (ref 22–32)
Calcium: 7.1 mg/dL — ABNORMAL LOW (ref 8.9–10.3)
Calcium: 7.4 mg/dL — ABNORMAL LOW (ref 8.9–10.3)
Chloride: 95 mmol/L — ABNORMAL LOW (ref 98–111)
Chloride: 95 mmol/L — ABNORMAL LOW (ref 98–111)
Creatinine, Ser: 5.74 mg/dL — ABNORMAL HIGH (ref 0.44–1.00)
Creatinine, Ser: 2.25 mg/dL — ABNORMAL HIGH (ref 0.44–1.00)
GFR calc Af Amer: 9 mL/min — ABNORMAL LOW
GFR calc Af Amer: 26 mL/min — ABNORMAL LOW (ref >60)
GFR calc non Af Amer: 7 mL/min — ABNORMAL LOW
GFR calc non Af Amer: 23 mL/min — ABNORMAL LOW (ref >60)
Glucose, Bld: 109 mg/dL — ABNORMAL HIGH (ref 70–99)
Glucose, Bld: 91 mg/dL (ref 70–99)
Phosphorus: 5.3 mg/dL — ABNORMAL HIGH (ref 2.5–4.6)
Phosphorus: 2.1 mg/dL — ABNORMAL LOW (ref 2.5–4.6)
Potassium: 5.3 mmol/L — ABNORMAL HIGH (ref 3.5–5.1)
Potassium: 2.7 mmol/L — CL (ref 3.5–5.1)
Sodium: 132 mmol/L — ABNORMAL LOW (ref 135–145)
Sodium: 137 mmol/L (ref 135–145)

## 2020-06-14 LAB — MAGNESIUM: Magnesium: 1.6 mg/dL — ABNORMAL LOW (ref 1.7–2.4)

## 2020-06-14 MED ORDER — FENTANYL CITRATE (PF) 100 MCG/2ML IJ SOLN
25.0000 ug | INTRAMUSCULAR | Status: DC | PRN
  Administered 2020-06-14 – 2020-06-16 (×3): 25 ug via INTRAVENOUS
  Filled 2020-06-14 (×3): qty 2

## 2020-06-14 MED ORDER — MAGNESIUM SULFATE IN D5W 1-5 GM/100ML-% IV SOLN
1.0000 g | Freq: Once | INTRAVENOUS | Status: AC
  Administered 2020-06-14: 1 g via INTRAVENOUS
  Filled 2020-06-14: qty 100

## 2020-06-14 MED ORDER — GENTAMICIN SULFATE 40 MG/ML IJ SOLN
60.0000 mg | Freq: Once | INTRAVENOUS | Status: AC
  Administered 2020-06-14: 60 mg via INTRAVENOUS
  Filled 2020-06-14: qty 1.5

## 2020-06-14 MED ORDER — NOREPINEPHRINE 4 MG/250ML-% IV SOLN
0.0000 ug/min | INTRAVENOUS | Status: DC
  Administered 2020-06-14: 2 ug/min via INTRAVENOUS
  Filled 2020-06-14: qty 250

## 2020-06-14 MED ORDER — DEXTROSE 5 % IV SOLN: INTRAVENOUS | Status: DC

## 2020-06-14 NOTE — Progress Notes (Signed)
Initial Nutrition Assessment  DOCUMENTATION CODES:   Not applicable  INTERVENTION:  If patient is unable to extubate today plan is for placement of enteral access and initiation of tube feeds.   Recommend Vital High Protein at 50 mL/hr (1200 mL goal daily volume) per tube + PROSource 45 mL BID per tube. Goal regimen provides 1280 kcal, 127 grams of protein, 1008 mL H2O daily. With propofol provides 1803 kcal daily.  Recommend Rena-vite QHS per tube if enteral access obtained.  NUTRITION DIAGNOSIS:   Inadequate oral intake related to inability to eat as evidenced by NPO status.  GOAL:   Patient will meet greater than or equal to 90% of their needs  MONITOR:   Vent status, Labs, Weight trends, Skin, I & O's  REASON FOR ASSESSMENT:   Ventilator    ASSESSMENT:   59 year old female with PMHx of asthma, DM, CAD, ESRD on HD, hx MI, mitral regurgitation, COPD, CHF, anxiety, depression, hx hiatal hernia, GERD, gastroparesis, arthritis, pancreatis insufficiency admitted with enterococcal bacteremia.   9/20 s/p TEE no evidence of endocarditis; procedure complicated by hypotension and bradycardia so pt intubated to protect airway 9/22 s/p removal of infected right arm hero AV graft and endarterectomy of right brachial artery with Cormatrix patch  Patient is currently intubated on ventilator support MV: 10.6 L/min Temp (24hrs), Avg:98 F (36.7 C), Min:95.6 F (35.3 C), Max:99.8 F (37.7 C)  Propofol: 19.82 ml/hr (523 kcal daily)  Medications reviewed and include: Epogen 10000 units during hD, ferrous sulfate 325 mg daily, Creon 12000 units TID, Rena-vite daily, vitamin B12 1000 micrograms daily, ampicillin, famotidine, gentamicin, norepinephrine gtt at 2 mcg/min, propofol gtt.  Labs reviewed: CBG 74-126, Sodium 132, Potassium 5.3, Chloride 95, CO2 20, Creatinine 5.74, Phosphorus 5.3, Magnesium 1.6.  I/O: pt anuric  Weight trend: 82.2 kg on 9/23; wt fluctuating during admission;  per Nephrology note dry wt is 81 kg  No enteral access at this time  Discussed with RN and on rounds. Plan has been to extubate patient each day so enteral access has not been placed and tube feeds were not initiated. However patient still remains intubated. Plan is to extubate after HD today but if still unable to extubate plan is now to place enteral access and initiate tube feeds.  NUTRITION - FOCUSED PHYSICAL EXAM:    Most Recent Value  Orbital Region No depletion  Upper Arm Region No depletion  Thoracic and Lumbar Region No depletion  Buccal Region Unable to assess  Temple Region No depletion  Clavicle Bone Region No depletion  Clavicle and Acromion Bone Region No depletion  Scapular Bone Region Unable to assess  Dorsal Hand No depletion  Patellar Region Mild depletion  Anterior Thigh Region Mild depletion  Posterior Calf Region Mild depletion  Edema (RD Assessment) Mild  Hair Reviewed  Eyes Unable to assess  Mouth Unable to assess  Skin Reviewed  Nails Reviewed     Diet Order:   Diet Order            Diet NPO time specified  Diet effective midnight                EDUCATION NEEDS:   No education needs have been identified at this time  Skin:  Skin Assessment: Skin Integrity Issues: (closed incision left arm)  Last BM:  06/14/2020 - type 6  Height:   Ht Readings from Last 1 Encounters:  06/12/20 5\' 5"  (1.651 m)   Weight:   Wt Readings  from Last 1 Encounters:  06/14/20 82.2 kg   Ideal Body Weight:  56.8 kg  BMI:  Body mass index is 30.16 kg/m.  Estimated Nutritional Needs:   Kcal:  1737  Protein:  115-125 grams  Fluid:  UOP + 1 L  Jacklynn Barnacle, MS, RD, LDN Pager number available on Amion

## 2020-06-14 NOTE — Progress Notes (Signed)
[facility] Daily Progress Note   Subjective: 06/13/20: 1. Removal of infected right arm hero AV graft 2. Endarterectomy of the right brachial artery with Cormatrix patch  Objective: Vitals:   06/14/20 0600 06/14/20 0700 06/14/20 0800 06/14/20 0819  BP: (!) 130/55 (!) 129/54 (!) 115/47   Pulse: 72 73 74   Resp: (!) 23 18 17    Temp:  99 F (37.2 C)    TempSrc:      SpO2: 97% 99% 95% 96%  Weight:      Height:        Intake/Output Summary (Last 24 hours) at 06/14/2020 4259 Last data filed at 06/14/2020 0600 Gross per 24 hour  Intake 987.41 ml  Output 100 ml  Net 887.41 ml    Physical Exam: Intubated CV: RRR Pulmonary: CTA Bilaterally Abdomen: Soft, Nontender, Nondistended Vascular:  Right Upper Extremity: OR dressing intact.  Arm is soft.  Hand is warm.   Laboratory: CBC    Component Value Date/Time   WBC 26.9 (H) 06/14/2020 0726   HGB 9.1 (L) 06/14/2020 0726   HGB 10.5 (L) 09/15/2014 0948   HCT 26.1 (L) 06/14/2020 0726   HCT 34.2 (L) 09/15/2014 0948   PLT 207 06/14/2020 0726   PLT 203 09/15/2014 0948   BMET    Component Value Date/Time   NA 132 (L) 06/14/2020 0726   NA 135 (L) 09/15/2014 0948   K 5.3 (H) 06/14/2020 0726   K 4.8 09/15/2014 0948   CL 95 (L) 06/14/2020 0726   CL 99 09/15/2014 0948   CO2 20 (L) 06/14/2020 0726   CO2 26 09/15/2014 0948   GLUCOSE 109 (H) 06/14/2020 0726   GLUCOSE 118 (H) 09/15/2014 0948   BUN 20 06/14/2020 0726   BUN 19 (H) 09/15/2014 0948   CREATININE 5.74 (H) 06/14/2020 0726   CREATININE 6.79 (H) 09/15/2014 0948   CALCIUM 7.1 (L) 06/14/2020 0726   CALCIUM 8.3 (L) 09/15/2014 0948   GFRNONAA 7 (L) 06/14/2020 0726   GFRNONAA 7 (L) 09/15/2014 0948   GFRNONAA 6 (L) 05/31/2014 0432   GFRAA 9 (L) 06/14/2020 0726   GFRAA 8 (L) 09/15/2014 0948   GFRAA 7 (L) 05/31/2014 0432   Assessment/Planning: The patient is a 59 year old female multiple medical issues including end-stage renal disease status post  right upper extremity hero graft / old graft removed - POD#1  1) right upper extremity is soft.  Some bleeding noted on OR dressings.  Hand is warm. 2) we will plan on removing iodoform to the proximal and distal incision tomorrow. 3) currently maintained by a temporary dialysis catheter will need to transition to PermCath in the future  Discussed with Dr. Eber Hong Miche Loughridge PA-C 06/14/2020 9:18 AM

## 2020-06-14 NOTE — Progress Notes (Signed)
CRITICAL CARE NOTE 59 y.o.femalewith medical history significant ofend-stage renal disease on hemodialysis Mondays Wednesdays and Fridays, hypertension, COPD, diastolic dysfunction CHF, depression, diabetes, hyperlipidemia, who apparently missed her dialysis on Friday.    9/20 TEE performed Tee revealed no evidence of endocartidits. Procedure complicated by hypotension and bradycaradia with heart rates dropping to mid 20's and blood pressure systolic to 12W. Received 1 amp of atropine, amp of epinephrine and neosynephrine. Pt intubated to protect air way. Stat head ct and chest ct were unremarkable. Pt son notified emmediately and has been updated with care. 9/21 remains on vent, plan for SAT/SBT  9/22 plan for OR today  MRI BRAIN-IMPRESSION: 1. No acute intracranial abnormality. 2. Few small remote lacunar infarcts involving the pons. 3. Otherwise normal brain MRI for age.   9/23 PLAN FOR HD TODAY AND ON 9/24 PLAN FOR SAT/SBT   CC  follow up respiratory failure  HPI Patient remains critically ill Prognosis is guarded HD TODAY   BP (!) 149/64   Pulse 71   Temp 99.8 F (37.7 C)   Resp 19   Ht _0  (1.651 m)   Wt 82.2 kg   SpO2 98%   BMI 30.16 kg/m    I/O last 3 completed shifts: In: 1405.5 [I.V.:822.3; IV Piggyback:583.2] Out: 1100 [Other:1000; Blood:100] No intake/output data recorded.  SpO2: 98 % O2 Flow Rate (L/min):  (Intubated ) FiO2 (%): 21 %  Estimated body mass index is 30.16 kg/m as calculated from the following:   Height as of this encounter: _1  (1.651 m).   Weight as of this encounter: 82.2 kg.  SIGNIFICANT EVENTS   REVIEW OF SYSTEMS  PATIENT IS UNABLE TO PROVIDE COMPLETE REVIEW OF SYSTEMS DUE TO SEVERE CRITICAL ILLNESS        PHYSICAL EXAMINATION:  GENERAL:critically ill appearing, +resp distress HEAD: Normocephalic, atraumatic.  EYES: Pupils equal, round, reactive to light.  No scleral icterus.  MOUTH: Moist mucosal  membrane. NECK: Supple.  PULMONARY: +rhonchi, CARDIOVASCULAR: S1 and S2. Regular rate and rhythm. No murmurs, rubs, or gallops.  GASTROINTESTINAL: Soft, nontender, -distended.  Positive bowel sounds.   MUSCULOSKELETAL: No swelling, clubbing, or edema.  NEUROLOGIC: obtunded, GCS<8 SKIN:intact,warm,dry  MEDICATIONS: I have reviewed all medications and confirmed regimen as documented   CULTURE RESULTS   Recent Results (from the past 240 hour(s))  Gastrointestinal Panel by PCR , Stool     Status: None   Collection Time: 06/04/20  7:39 PM   Specimen: Stool  Result Value Ref Range Status   Campylobacter species NOT DETECTED NOT DETECTED Final   Plesimonas shigelloides NOT DETECTED NOT DETECTED Final   Salmonella species NOT DETECTED NOT DETECTED Final   Yersinia enterocolitica NOT DETECTED NOT DETECTED Final   Vibrio species NOT DETECTED NOT DETECTED Final   Vibrio cholerae NOT DETECTED NOT DETECTED Final   Enteroaggregative E coli (EAEC) NOT DETECTED NOT DETECTED Final   Enteropathogenic E coli (EPEC) NOT DETECTED NOT DETECTED Final   Enterotoxigenic E coli (ETEC) NOT DETECTED NOT DETECTED Final   Shiga like toxin producing E coli (STEC) NOT DETECTED NOT DETECTED Final   Shigella/Enteroinvasive E coli (EIEC) NOT DETECTED NOT DETECTED Final   Cryptosporidium NOT DETECTED NOT DETECTED Final   Cyclospora cayetanensis NOT DETECTED NOT DETECTED Final   Entamoeba histolytica NOT DETECTED NOT DETECTED Final   Giardia lamblia NOT DETECTED NOT DETECTED Final   Adenovirus F40/41 NOT DETECTED NOT DETECTED Final   Astrovirus NOT DETECTED NOT DETECTED Final   Norovirus GI/GII NOT  DETECTED NOT DETECTED Final   Rotavirus A NOT DETECTED NOT DETECTED Final   Sapovirus (I, II, IV, and V) NOT DETECTED NOT DETECTED Final    Comment: Performed at [facility], [address] [place], [place] [address]  Culture, blood (Routine X 2) w Reflex to ID Panel     Status: Abnormal   Collection Time:  06/04/20  8:49 PM   Specimen: BLOOD  Result Value Ref Range Status   Specimen Description   Final    BLOOD BLOOD LEFT FOREARM Performed at [facility], [address], [place], [place] [address]    Special Requests   Final    BOTTLES DRAWN AEROBIC AND ANAEROBIC Blood Culture adequate volume Performed at [facility], [address] [place], [place] [address]    Culture  Setup Time   Final    IN BOTH AEROBIC AND ANAEROBIC BOTTLES GRAM POSITIVE COCCI Organism ID to follow CRITICAL RESULT CALLED TO, READ BACK BY AND VERIFIED WITH: CHARLES SHANLEVER _0  06/06/20 MJU LISA KLUTTZ AT 1696 ON 06/07/2020 [place]. Performed at [facility], [place] [address], [place], [place] [address]    Culture ENTEROCOCCUS FAECALIS (A)  Final   Report Status 06/08/2020 FINAL  Final   Organism ID, Bacteria ENTEROCOCCUS FAECALIS  Final      Susceptibility   Enterococcus faecalis - MIC*    AMPICILLIN <=2 SENSITIVE Sensitive     VANCOMYCIN 1 SENSITIVE Sensitive     GENTAMICIN SYNERGY SENSITIVE Sensitive     * ENTEROCOCCUS FAECALIS  Blood Culture ID Panel (Reflexed)     Status: Abnormal   Collection Time: 06/04/20  8:49 PM  Result Value Ref Range Status   Enterococcus faecalis DETECTED (A) NOT DETECTED Final    Comment: CRITICAL RESULT CALLED TO, READ BACK BY AND VERIFIED WITH: CHARLES SHANLEVER _1  06/06/20 MJU    Enterococcus Faecium NOT DETECTED NOT DETECTED Final   Listeria monocytogenes NOT DETECTED NOT DETECTED Final   Staphylococcus species NOT DETECTED NOT DETECTED Final   Staphylococcus aureus (BCID) NOT DETECTED NOT DETECTED Final   Staphylococcus epidermidis NOT DETECTED NOT DETECTED Final   Staphylococcus lugdunensis NOT DETECTED NOT DETECTED Final   Streptococcus species NOT DETECTED NOT DETECTED Final   Streptococcus agalactiae NOT DETECTED NOT DETECTED Final   Streptococcus pneumoniae NOT DETECTED NOT DETECTED Final   Streptococcus pyogenes NOT DETECTED NOT  DETECTED Final   A.calcoaceticus-baumannii NOT DETECTED NOT DETECTED Final   Bacteroides fragilis NOT DETECTED NOT DETECTED Final   Enterobacterales NOT DETECTED NOT DETECTED Final   Enterobacter cloacae complex NOT DETECTED NOT DETECTED Final   Escherichia coli NOT DETECTED NOT DETECTED Final   Klebsiella aerogenes NOT DETECTED NOT DETECTED Final   Klebsiella oxytoca NOT DETECTED NOT DETECTED Final   Klebsiella pneumoniae NOT DETECTED NOT DETECTED Final   Proteus species NOT DETECTED NOT DETECTED Final   Salmonella species NOT DETECTED NOT DETECTED Final   Serratia marcescens NOT DETECTED NOT DETECTED Final   Haemophilus influenzae NOT DETECTED NOT DETECTED Final   Neisseria meningitidis NOT DETECTED NOT DETECTED Final   Pseudomonas aeruginosa NOT DETECTED NOT DETECTED Final   Stenotrophomonas maltophilia NOT DETECTED NOT DETECTED Final   Candida albicans NOT DETECTED NOT DETECTED Final   Candida auris NOT DETECTED NOT DETECTED Final   Candida glabrata NOT DETECTED NOT DETECTED Final   Candida krusei NOT DETECTED NOT DETECTED Final   Candida parapsilosis NOT DETECTED NOT DETECTED Final   Candida tropicalis NOT DETECTED NOT DETECTED Final  Cryptococcus neoformans/gattii NOT DETECTED NOT DETECTED Final   Vancomycin resistance NOT DETECTED NOT DETECTED Final    Comment: Performed at [facility], [address] [place], [place] [address]  Culture, blood (Routine X 2) w Reflex to ID Panel     Status: Abnormal   Collection Time: 06/05/20  1:25 PM   Specimen: BLOOD  Result Value Ref Range Status   Specimen Description   Final    BLOOD LAC Performed at [facility], [address], [place], [place] [address]    Special Requests   Final    BOTTLES DRAWN AEROBIC AND ANAEROBIC Blood Culture adequate volume Performed at [facility], [address] [place], [place] [address]    Culture  Setup Time   Final    Organism ID to follow ANAEROBIC BOTTLE  ONLY CRITICAL RESULT CALLED TO, READ BACK BY AND VERIFIED WITH: JASON ROBBINS 06/07/20 AT 1506 BY ACR GRAM POSITIVE COCCI PREVIOUSLY REPORTED AS: GRAM NEGATIVE COCCOBACILLI CORRECTED RESULTS CALLED TO: D,Ziegler PHARMD, at [place] 06/07/20 BY E. BENTON    Culture (A)  Final    ENTEROCOCCUS FAECALIS SUSCEPTIBILITIES PERFORMED ON PREVIOUS CULTURE WITHIN THE LAST 5 DAYS. Performed at [facility], [place] [address], [place], [place] [address]    Report Status 06/08/2020 FINAL  Final  Blood Culture ID Panel (Reflexed)     Status: Abnormal   Collection Time: 06/05/20  1:25 PM  Result Value Ref Range Status   Enterococcus faecalis DETECTED (A) NOT DETECTED Final    Comment: CRITICAL RESULT CALLED TO, READ BACK BY AND VERIFIED WITH: JASON ROBBINS 06/07/20 AT 1506 BY ACR    Enterococcus Faecium NOT DETECTED NOT DETECTED Final   Listeria monocytogenes NOT DETECTED NOT DETECTED Final   Staphylococcus species NOT DETECTED NOT DETECTED Final   Staphylococcus aureus (BCID) NOT DETECTED NOT DETECTED Final   Staphylococcus epidermidis NOT DETECTED NOT DETECTED Final   Staphylococcus lugdunensis NOT DETECTED NOT DETECTED Final   Streptococcus species NOT DETECTED NOT DETECTED Final   Streptococcus agalactiae NOT DETECTED NOT DETECTED Final   Streptococcus pneumoniae NOT DETECTED NOT DETECTED Final   Streptococcus pyogenes NOT DETECTED NOT DETECTED Final   A.calcoaceticus-baumannii NOT DETECTED NOT DETECTED Final   Bacteroides fragilis NOT DETECTED NOT DETECTED Final   Enterobacterales NOT DETECTED NOT DETECTED Final   Enterobacter cloacae complex NOT DETECTED NOT DETECTED Final   Escherichia coli NOT DETECTED NOT DETECTED Final   Klebsiella aerogenes NOT DETECTED NOT DETECTED Final   Klebsiella oxytoca NOT DETECTED NOT DETECTED Final   Klebsiella pneumoniae NOT DETECTED NOT DETECTED Final   Proteus species NOT DETECTED NOT DETECTED Final   Salmonella species NOT DETECTED NOT DETECTED Final    Serratia marcescens NOT DETECTED NOT DETECTED Final   Haemophilus influenzae NOT DETECTED NOT DETECTED Final   Neisseria meningitidis NOT DETECTED NOT DETECTED Final   Pseudomonas aeruginosa NOT DETECTED NOT DETECTED Final   Stenotrophomonas maltophilia NOT DETECTED NOT DETECTED Final   Candida albicans NOT DETECTED NOT DETECTED Final   Candida auris NOT DETECTED NOT DETECTED Final   Candida glabrata NOT DETECTED NOT DETECTED Final   Candida krusei NOT DETECTED NOT DETECTED Final   Candida parapsilosis NOT DETECTED NOT DETECTED Final   Candida tropicalis NOT DETECTED NOT DETECTED Final   Cryptococcus neoformans/gattii NOT DETECTED NOT DETECTED Final   Vancomycin resistance NOT DETECTED NOT DETECTED Final    Comment: Performed at [facility], [address] [place],  [address]  CULTURE, BLOOD (  ROUTINE X 2) w Reflex to ID Panel     Status: None   Collection Time: 06/07/20  7:02 PM   Specimen: BLOOD  Result Value Ref Range Status   Specimen Description BLOOD BLOOD LEFT FOREARM  Final   Special Requests   Final    BOTTLES DRAWN AEROBIC AND ANAEROBIC Blood Culture results may not be optimal due to an inadequate volume of blood received in culture bottles   Culture   Final    NO GROWTH 5 DAYS Performed at [facility], [address] [place], [place] [address]    Report Status 06/12/2020 FINAL  Final  CULTURE, BLOOD (ROUTINE X 2) w Reflex to ID Panel     Status: None   Collection Time: 06/07/20  8:46 PM   Specimen: BLOOD  Result Value Ref Range Status   Specimen Description BLOOD BLOOD LEFT HAND  Final   Special Requests   Final    BOTTLES DRAWN AEROBIC ONLY Blood Culture results may not be optimal due to an inadequate volume of blood received in culture bottles   Culture   Final    NO GROWTH 5 DAYS Performed at [facility], [address] [place], [place] [address]    Report Status 06/12/2020 FINAL  Final  MRSA PCR Screening     Status: None    Collection Time: 06/11/20 10:40 AM   Specimen: Nasopharyngeal  Result Value Ref Range Status   MRSA by PCR NEGATIVE NEGATIVE Final    Comment:        The GeneXpert MRSA Assay (FDA approved for NASAL specimens only), is one component of a comprehensive MRSA colonization surveillance program. It is not intended to diagnose MRSA infection nor to guide or monitor treatment for MRSA infections. Performed at [facility], [address] [place], [place] [address]   Aerobic/Anaerobic Culture (surgical/deep wound)     Status: None (Preliminary result)   Collection Time: 06/13/20  2:23 PM   Specimen: Wound; Abscess  Result Value Ref Range Status   Specimen Description   Final    WOUND Performed at [facility], [address], [place], [place] [address]    Special Requests   Final    RIGHT ARM PERI GRAFT ABSCESS Performed at [facility], [address] [place], [place] [address]    Gram Stain   Final    NO WBC SEEN NO ORGANISMS SEEN Performed at [facility], [place] [address], [place], [place] [address]    Culture PENDING  Incomplete   Report Status PENDING  Incomplete         Indwelling Urinary Catheter continued, requirement due to   Reason to continue Indwelling Urinary Catheter strict Intake/Output monitoring for hemodynamic instability   Central Line/ continued, requirement due to  Reason to continue [place] of central venous pressure or other hemodynamic parameters and poor IV access   Ventilator continued, requirement due to severe respiratory failure   Ventilator Sedation RASS 0 to -2      ASSESSMENT AND PLAN SYNOPSIS  59 yo obese female with multiple medical issues with enterococcus bacteremia s/p TEE with post op complication with near cardiac arrest with acute severe resp failure  Severe ACUTE Hypoxic and Hypercapnic Respiratory Failuredue to acute hypotension and near cardiac arrest due to sedative  medications with underlying multiple medical conditions with severe sepsis and bacteremia  Severe ACUTE Hypoxic and Hypercapnic Respiratory Failure -continue Full MV support -continue Bronchodilator Therapy -Wean  Fio2 and PEEP as tolerated -will perform SAT/SBT when respiratory parameters are met -VAP/VENT bundle implementation  ACUTE DIASTOLIC CARDIAC FAILURE-  -follow up cardiac enzymes as indicated -follow up cardiology recs   Morbid obesity, possible OSA.   Will certainly impact respiratory mechanics, ventilator weaning Suspect will need to consider additional PEEP   KIDNEY INJURY/Renal Failure -continue Foley Catheter-assess need -Avoid nephrotoxic agents -Follow urine output, BMP -Ensure adequate renal perfusion, optimize oxygenation -Renal dose medications HD TODAY     NEUROLOGY - intubated and sedated - minimal sedation to achieve a RASS goal: -1 Wake up assessment pending  CARDIAC ICU monitoring  ID ENTEROCOCCUS BACTERAMIA -continue IV abx as prescibed -follow up cultures  GI GI PROPHYLAXIS as indicated   DIET-->TF's as tolerated Constipation protocol as indicated  ENDO - will use ICU hypoglycemic\Hyperglycemia protocol if indicated     ELECTROLYTES -follow labs as needed -replace as needed -pharmacy consultation and following   DVT/GI PRX ordered and assessed TRANSFUSIONS AS NEEDED MONITOR FSBS I Assessed the need for Labs I Assessed the need for Foley I Assessed the need for Central Venous Line Family Discussion when available I Assessed the need for Mobilization I made an Assessment of medications to be adjusted accordingly Safety Risk assessment completed     Critical Care Time devoted to patient care services described in this note is 35 minutes.   Overall, patient is critically ill, prognosis is guarded.  Patient with Multiorgan failure and at high risk for cardiac arrest and death.    PLAN DISCUSSED WITH SON HD  TODAY(THURSDAY) Friday-WAIT FOR SON AT BEDSIDE AND START SAT/SBT  Corrin Parker, M.D.  [facility]  Medical Director [place] Director [facility] Cardio-Pulmonary Department

## 2020-06-14 NOTE — Progress Notes (Signed)
Patient extubated per Dr. Montine Circle. Patient extubated to 2L nasal cannula. Patient's oxygen saturation is 100%. No distress noted.

## 2020-06-14 NOTE — Consult Note (Addendum)
Pharmacy Antibiotic Note  [name] is a 59 y.o. female admitted on 06/02/2020 with respiratory failure secondary to acute diastolic CHF and COPD exacerbation. PMH includes ESRD on HD, HTN, DM, CAD, PCD.  Pharmacy has been consulted for ampicillin and gentamicin dosing.  Patient with ESRD on HD with numerous graft complications. Erythemous firm swelling in right cubital area of graft arm.  Graft site noted as possible source of bloodstream infection, removed 9/22.  Today, 06/14/2020 Day #6 Gentamicin - AVG removed 9/22 so no dialysis performed - HD today for ~2.5hr - AVG culture too young to read -9/21 Gentamicin level obtained at 0425 and resulted as 3 (goal peak: 3-4 mg/L, goal trough <1) s/p LD of 100mg  9/17 at 1800 and MD of 80 mg 9/20 at 0026 w/ one 2 hour dialysis session on 9/19 with BFR 250 (max: 400). Full dialysis session planned for today  TTE 9/16: moderate tricuspid valve regurgitation, no vegetation visualized  TEE 9/20: mild to moderate tricuspid valve regurgitation, no vegetation visualized. Procedure complicated by hypotension, respiratory depression, and bradycardia.    Cultures this admission:  BCx 9/13: Enterococcus faecalis, amp sensitive, gent synergy sensitive BCx 9/14: Enterococcus faecalis  BCID 9/14: Enterococcus faecalis detected  BCx 9/16: NGTD in 4/4 bottles  Plan:  Confirmed with ID, continue gentamicin for now pending graft culture result.  Administer gentamicin 60mg  IV x 1 today.   Continue gentamicin 80mg  following full dialysis sessions.   Monitor gentamicin levels weekly. Monitor signs of ototoxicity (I.e. ringing in ears daily during therapy)   Continue ampicillin 2g IV every 12 hours   Height: 5\' 5"  (165.1 cm) Weight: 82.2 kg (181 lb 3.5 oz) IBW/kg (Calculated) : 57  Temp (24hrs), Avg:98 F (36.7 C), Min:95.6 F (35.3 C), Max:99.8 F (37.7 C)  Recent Labs  Lab 06/10/20 2119 06/10/20 2119 06/11/20 0648 06/11/20 1431 06/12/20 0123  06/12/20 0425 06/13/20 0532 06/14/20 0726  WBC 4.6  --   --   --  10.9* 10.1 12.0* 26.9*  CREATININE 4.04*   < > 4.87* 5.28*  --  5.53* 4.39*  4.36* 5.74*  GENTRANDOM  --   --   --   --   --  3.0  --   --    < > = values in this interval not displayed.    Estimated Creatinine Clearance: 10.9 mL/min (A) (by C-G formula based on SCr of 5.74 mg/dL (H)).    Allergies  Allergen Reactions  . Ace Inhibitors Swelling and Anaphylaxis  . Ativan [Lorazepam] Other (See Comments)    Reaction:Hallucinations and headaches  . Compazine [Prochlorperazine Edisylate] Anaphylaxis, Nausea And Vomiting and Other (See Comments)    Other reaction(s): dystonia from this vs. Reglan, 23 Jul - patient relates that she takes promethazine frequently with no problems  . Sumatriptan Succinate Other (See Comments)    Other reaction(s): delirium and hallucinations per [facility] records  . Zofran [Ondansetron] Nausea And Vomiting    Per pt. she is allergic to zofran or will experience adverse reaction like hallucination   . Lac Bovis Nausea And Vomiting  . Losartan Nausea Only  . Prochlorperazine Other (See Comments)    Reaction:  Unknown . Patient does not remember reaction but she does have vertigo and anxiety along with n and v at times. Could be used to treat any of these   . Reglan [Metoclopramide] Other (See Comments)    Per patient her Dr. Evelina Bucy her off it   . Scopolamine Other (See Comments)  Dizziness, also has vertigo already  . Tape Rash    Plastic tape causes rash  . Tapentadol Rash    Antimicrobials this admission: Azithromycin 9/12 >> 9/15 Ceftriaxone 9/12 >> 9/15  Zosyn 9/15 >> 9/17  Ampicillin 9/17 >>  Gentamicin 9/17 >>     Thank you for allowing pharmacy to be a part of this patient's care.  Doreene Eland, PharmD, BCPS.   Work Cell: [phone] 06/14/2020 4:43 PM

## 2020-06-14 NOTE — Progress Notes (Signed)
Patient extubated, plaaced on 2 liters, oxygenating 100%. Son called and updated.

## 2020-06-14 NOTE — Progress Notes (Signed)
Dr Salley Scarlet notified of patients potassium level 2.7. Per Dr Salley Scarlet no additional orders at this time. Hemoglobin 7.7, Dr. Patsey Berthold notified. No additional orders.

## 2020-06-14 NOTE — Progress Notes (Signed)
[facility]  ROUNDING NOTE   Subjective:   AVG removed yesterday by Dr. Delana Meyer.  Hemodialysis for later today Patient remains intubated.   Discussed patient's case with son, [name].    Objective:  Vital signs in last 24 hours:  Temp:  [95.6 F (35.3 C)-99.8 F (37.7 C)] 99 F (37.2 C) (09/23 0700) Pulse Rate:  [49-77] 71 (09/23 1000) Resp:  [17-23] 17 (09/23 0800) BP: (103-173)/(42-79) 103/42 (09/23 1000) SpO2:  [95 %-100 %] 100 % (09/23 1052) FiO2 (%):  [21 %] 21 % (09/23 1052) Weight:  [82.2 kg] 82.2 kg (09/23 0500)  Weight change: 5.9 kg Filed Weights   06/12/20 0432 06/13/20 0500 06/14/20 0500  Weight: 81.4 kg 76.3 kg 82.2 kg    Intake/Output: I/O last 3 completed shifts: In: 1229.2 [I.V.:795.4; IV Piggyback:433.8] Out: 100 [Blood:100]   Intake/Output this shift:  No intake/output data recorded.  Physical Exam: General: Critically ill  Head: ETT   Eyes: Anicteric  Neck: trachea midline  Lungs:  PRVC fIO2 21%  Heart: bradycardia  Abdomen:  Soft  Extremities:  no peripheral edema.  Neurologic: Intubated and sedated  Skin: No lesions  Access: Right Hero graft, tender and erythematous swollen area. Right femoral temporary HD catheter     Basic Metabolic Panel: Recent Labs  Lab 06/08/20 1722 06/10/20 2119 06/10/20 2119 06/11/20 4765 06/11/20 0648 06/11/20 1431 06/11/20 1431 06/12/20 0425 06/13/20 0532 06/14/20 0726  NA  --  135   < > 130*  --  134*  --  134* 136  138 132*  K  --  2.9*   < > 3.3*  --  4.2  --  3.4* 3.4*  3.3* 5.3*  CL  --  96*   < > 94*  --  96*  --  97* 97*  96* 95*  CO2  --  27   < > 26  --  23  --  _0 20*  GLUCOSE  --  98   < > 115*  --  121*  --  91 83  88 109*  BUN  --  12   < > 14  --  16  --  _1 CREATININE  --  4.04*   < > 4.87*  --  5.28*  --  5.53* 4.39*  4.36* 5.74*  CALCIUM  --  7.8*   < > 7.9*   < > 8.3*   < > 8.3* 7.6*  7.9* 7.1*  MG  --   --   --   --   --   --   --  1.7 1.8  1.6*  PHOS 1.9* 1.5*  --   --   --   --   --  1.9* 3.2 5.3*   < > = values in this interval not displayed.    Liver Function Tests: Recent Labs  Lab 06/10/20 2119 06/12/20 0425 06/13/20 0532 06/14/20 0726  ALBUMIN 2.7* 2.8* 2.4* 2.3*   No results for input(s): LIPASE, AMYLASE in the last 168 hours. No results for input(s): AMMONIA in the last 168 hours.  CBC: Recent Labs  Lab 06/10/20 2119 06/12/20 0123 06/12/20 0425 06/13/20 0532 06/14/20 0726  WBC 4.6 10.9* 10.1 12.0* 26.9*  HGB 8.4* 9.2* 9.0* 10.2* 9.1*  HCT 25.3* 29.6* 27.7* 30.5* 26.1*  MCV 86.6 92.5 88.8 87.4 82.1  PLT 187 151 195 209 207    Cardiac Enzymes: No results for input(s): CKTOTAL, CKMB,  CKMBINDEX, TROPONINI in the last 168 hours.  BNP: Invalid input(s): POCBNP  CBG: Recent Labs  Lab 06/13/20 1919 06/14/20 0000 06/14/20 0331 06/14/20 0727 06/14/20 1127  GLUCAP 133* 166* 144* 126* 115*    Microbiology: Results for orders placed or performed during the hospital encounter of 06/02/20  SARS Coronavirus 2 by RT PCR (hospital order, performed in [facility] hospital lab) Nasopharyngeal Nasopharyngeal Swab     Status: None   Collection Time: 06/03/20  6:49 AM   Specimen: Nasopharyngeal Swab  Result Value Ref Range Status   SARS Coronavirus 2 NEGATIVE NEGATIVE Final    Comment: (NOTE) SARS-CoV-2 target nucleic acids are NOT DETECTED.  The SARS-CoV-2 RNA is generally detectable in upper and lower respiratory specimens during the acute phase of infection. The lowest concentration of SARS-CoV-2 viral copies this assay can detect is 250 copies / mL. A negative result does not preclude SARS-CoV-2 infection and should not be used as the sole basis for treatment or other patient management decisions.  A negative result may occur with improper specimen collection / handling, submission of specimen other than nasopharyngeal swab, presence of viral mutation(s) within the areas targeted by this assay,  and inadequate number of viral copies (<250 copies / mL). A negative result must be combined with clinical observations, patient history, and epidemiological information.  Fact Sheet for Patients:   StrictlyIdeas.no  Fact Sheet for Healthcare Providers: BankingDealers.co.za  This test is not yet approved or  cleared by the [place] FDA and has been authorized for detection and/or diagnosis of SARS-CoV-2 by FDA under an Emergency Use Authorization (EUA).  This EUA will remain in effect (meaning this test can be used) for the duration of the COVID-19 declaration under Section 564(b)(1) of the Act, 21 U.S.C. section 360bbb-3(b)(1), unless the authorization is terminated or revoked sooner.  Performed at [facility], [address] [place], [place] [address]   Gastrointestinal Panel by PCR , Stool     Status: None   Collection Time: 06/04/20  7:39 PM   Specimen: Stool  Result Value Ref Range Status   Campylobacter species NOT DETECTED NOT DETECTED Final   Plesimonas shigelloides NOT DETECTED NOT DETECTED Final   Salmonella species NOT DETECTED NOT DETECTED Final   Yersinia enterocolitica NOT DETECTED NOT DETECTED Final   Vibrio species NOT DETECTED NOT DETECTED Final   Vibrio cholerae NOT DETECTED NOT DETECTED Final   Enteroaggregative E coli (EAEC) NOT DETECTED NOT DETECTED Final   Enteropathogenic E coli (EPEC) NOT DETECTED NOT DETECTED Final   Enterotoxigenic E coli (ETEC) NOT DETECTED NOT DETECTED Final   Shiga like toxin producing E coli (STEC) NOT DETECTED NOT DETECTED Final   Shigella/Enteroinvasive E coli (EIEC) NOT DETECTED NOT DETECTED Final   Cryptosporidium NOT DETECTED NOT DETECTED Final   Cyclospora cayetanensis NOT DETECTED NOT DETECTED Final   Entamoeba histolytica NOT DETECTED NOT DETECTED Final   Giardia lamblia NOT DETECTED NOT DETECTED Final   Adenovirus F40/41 NOT DETECTED NOT DETECTED Final    Astrovirus NOT DETECTED NOT DETECTED Final   Norovirus GI/GII NOT DETECTED NOT DETECTED Final   Rotavirus A NOT DETECTED NOT DETECTED Final   Sapovirus (I, II, IV, and V) NOT DETECTED NOT DETECTED Final    Comment: Performed at [facility], [address] [place], [place] [address]  Culture, blood (Routine X 2) w Reflex to ID Panel     Status: Abnormal   Collection Time: 06/04/20  8:49 PM   Specimen: BLOOD  Result Value Ref Range Status   Specimen Description   Final    BLOOD BLOOD LEFT FOREARM Performed at [facility], [address], [place], [place] [address]    Special Requests   Final    BOTTLES DRAWN AEROBIC AND ANAEROBIC Blood Culture adequate volume Performed at [facility], [address] [place], [place] [address]    Culture  Setup Time   Final    IN BOTH AEROBIC AND ANAEROBIC BOTTLES GRAM POSITIVE COCCI Organism ID to follow CRITICAL RESULT CALLED TO, READ BACK BY AND VERIFIED WITH: CHARLES SHANLEVER _0  06/06/20 MJU LISA KLUTTZ AT 5284 ON 06/07/2020 [place]. Performed at [facility], [place] [address], [place], [place] [address]    Culture ENTEROCOCCUS FAECALIS (A)  Final   Report Status 06/08/2020 FINAL  Final   Organism ID, Bacteria ENTEROCOCCUS FAECALIS  Final      Susceptibility   Enterococcus faecalis - MIC*    AMPICILLIN <=2 SENSITIVE Sensitive     VANCOMYCIN 1 SENSITIVE Sensitive     GENTAMICIN SYNERGY SENSITIVE Sensitive     * ENTEROCOCCUS FAECALIS  Blood Culture ID Panel (Reflexed)     Status: Abnormal   Collection Time: 06/04/20  8:49 PM  Result Value Ref Range Status   Enterococcus faecalis DETECTED (A) NOT DETECTED Final    Comment: CRITICAL RESULT CALLED TO, READ BACK BY AND VERIFIED WITH: CHARLES SHANLEVER _1  06/06/20 MJU    Enterococcus Faecium NOT DETECTED NOT DETECTED Final   Listeria monocytogenes NOT DETECTED NOT DETECTED Final   Staphylococcus species NOT DETECTED NOT DETECTED Final   Staphylococcus  aureus (BCID) NOT DETECTED NOT DETECTED Final   Staphylococcus epidermidis NOT DETECTED NOT DETECTED Final   Staphylococcus lugdunensis NOT DETECTED NOT DETECTED Final   Streptococcus species NOT DETECTED NOT DETECTED Final   Streptococcus agalactiae NOT DETECTED NOT DETECTED Final   Streptococcus pneumoniae NOT DETECTED NOT DETECTED Final   Streptococcus pyogenes NOT DETECTED NOT DETECTED Final   A.calcoaceticus-baumannii NOT DETECTED NOT DETECTED Final   Bacteroides fragilis NOT DETECTED NOT DETECTED Final   Enterobacterales NOT DETECTED NOT DETECTED Final   Enterobacter cloacae complex NOT DETECTED NOT DETECTED Final   Escherichia coli NOT DETECTED NOT DETECTED Final   Klebsiella aerogenes NOT DETECTED NOT DETECTED Final   Klebsiella oxytoca NOT DETECTED NOT DETECTED Final   Klebsiella pneumoniae NOT DETECTED NOT DETECTED Final   Proteus species NOT DETECTED NOT DETECTED Final   Salmonella species NOT DETECTED NOT DETECTED Final   Serratia marcescens NOT DETECTED NOT DETECTED Final   Haemophilus influenzae NOT DETECTED NOT DETECTED Final   Neisseria meningitidis NOT DETECTED NOT DETECTED Final   Pseudomonas aeruginosa NOT DETECTED NOT DETECTED Final   Stenotrophomonas maltophilia NOT DETECTED NOT DETECTED Final   Candida albicans NOT DETECTED NOT DETECTED Final   Candida auris NOT DETECTED NOT DETECTED Final   Candida glabrata NOT DETECTED NOT DETECTED Final   Candida krusei NOT DETECTED NOT DETECTED Final   Candida parapsilosis NOT DETECTED NOT DETECTED Final   Candida tropicalis NOT DETECTED NOT DETECTED Final   Cryptococcus neoformans/gattii NOT DETECTED NOT DETECTED Final   Vancomycin resistance NOT DETECTED NOT DETECTED Final    Comment: Performed at [facility], [address] [place], [place] [address]  Culture, blood (Routine X 2) w Reflex to ID Panel     Status: Abnormal   Collection Time: 06/05/20  1:25 PM   Specimen: BLOOD  Result Value Ref Range Status    Specimen  Description   Final    BLOOD LAC Performed at [facility], [address] [place], [place] [address]    Special Requests   Final    BOTTLES DRAWN AEROBIC AND ANAEROBIC Blood Culture adequate volume Performed at [facility], [address] [place], [place] [address]    Culture  Setup Time   Final    Organism ID to follow ANAEROBIC BOTTLE ONLY CRITICAL RESULT CALLED TO, READ BACK BY AND VERIFIED WITH: JASON ROBBINS 06/07/20 AT 1506 BY ACR GRAM POSITIVE COCCI PREVIOUSLY REPORTED AS: GRAM NEGATIVE COCCOBACILLI CORRECTED RESULTS CALLED TO: D,Ziegler PHARMD, at [facility] 06/07/20 BY E. BENTON    Culture (A)  Final    ENTEROCOCCUS FAECALIS SUSCEPTIBILITIES PERFORMED ON PREVIOUS CULTURE WITHIN THE LAST 5 DAYS. Performed at [facility], [place] [address] [place], [place] [address]    Report Status 06/08/2020 FINAL  Final  Blood Culture ID Panel (Reflexed)     Status: Abnormal   Collection Time: 06/05/20  1:25 PM  Result Value Ref Range Status   Enterococcus faecalis DETECTED (A) NOT DETECTED Final    Comment: CRITICAL RESULT CALLED TO, READ BACK BY AND VERIFIED WITH: JASON ROBBINS 06/07/20 AT 1506 BY ACR    Enterococcus Faecium NOT DETECTED NOT DETECTED Final   Listeria monocytogenes NOT DETECTED NOT DETECTED Final   Staphylococcus species NOT DETECTED NOT DETECTED Final   Staphylococcus aureus (BCID) NOT DETECTED NOT DETECTED Final   Staphylococcus epidermidis NOT DETECTED NOT DETECTED Final   Staphylococcus lugdunensis NOT DETECTED NOT DETECTED Final   Streptococcus species NOT DETECTED NOT DETECTED Final   Streptococcus agalactiae NOT DETECTED NOT DETECTED Final   Streptococcus pneumoniae NOT DETECTED NOT DETECTED Final   Streptococcus pyogenes NOT DETECTED NOT DETECTED Final   A.calcoaceticus-baumannii NOT DETECTED NOT DETECTED Final   Bacteroides fragilis NOT DETECTED NOT DETECTED Final   Enterobacterales NOT DETECTED NOT DETECTED Final    Enterobacter cloacae complex NOT DETECTED NOT DETECTED Final   Escherichia coli NOT DETECTED NOT DETECTED Final   Klebsiella aerogenes NOT DETECTED NOT DETECTED Final   Klebsiella oxytoca NOT DETECTED NOT DETECTED Final   Klebsiella pneumoniae NOT DETECTED NOT DETECTED Final   Proteus species NOT DETECTED NOT DETECTED Final   Salmonella species NOT DETECTED NOT DETECTED Final   Serratia marcescens NOT DETECTED NOT DETECTED Final   Haemophilus influenzae NOT DETECTED NOT DETECTED Final   Neisseria meningitidis NOT DETECTED NOT DETECTED Final   Pseudomonas aeruginosa NOT DETECTED NOT DETECTED Final   Stenotrophomonas maltophilia NOT DETECTED NOT DETECTED Final   Candida albicans NOT DETECTED NOT DETECTED Final   Candida auris NOT DETECTED NOT DETECTED Final   Candida glabrata NOT DETECTED NOT DETECTED Final   Candida krusei NOT DETECTED NOT DETECTED Final   Candida parapsilosis NOT DETECTED NOT DETECTED Final   Candida tropicalis NOT DETECTED NOT DETECTED Final   Cryptococcus neoformans/gattii NOT DETECTED NOT DETECTED Final   Vancomycin resistance NOT DETECTED NOT DETECTED Final    Comment: Performed at [facility], [address] [place], [place] [address]  CULTURE, BLOOD (ROUTINE X 2) w Reflex to ID Panel     Status: None   Collection Time: 06/07/20  7:02 PM   Specimen: BLOOD  Result Value Ref Range Status   Specimen Description BLOOD BLOOD LEFT FOREARM  Final   Special Requests   Final    BOTTLES DRAWN AEROBIC AND ANAEROBIC Blood Culture results may not be optimal due to an inadequate volume of blood  received in culture bottles   Culture   Final    NO GROWTH 5 DAYS Performed at [facility], [address] [place], [place] [address]    Report Status 06/12/2020 FINAL  Final  CULTURE, BLOOD (ROUTINE X 2) w Reflex to ID Panel     Status: None   Collection Time: 06/07/20  8:46 PM   Specimen: BLOOD  Result Value Ref Range Status   Specimen Description BLOOD  BLOOD LEFT HAND  Final   Special Requests   Final    BOTTLES DRAWN AEROBIC ONLY Blood Culture results may not be optimal due to an inadequate volume of blood received in culture bottles   Culture   Final    NO GROWTH 5 DAYS Performed at [facility], [address] [place], [place] [address]    Report Status 06/12/2020 FINAL  Final  MRSA PCR Screening     Status: None   Collection Time: 06/11/20 10:40 AM   Specimen: Nasopharyngeal  Result Value Ref Range Status   MRSA by PCR NEGATIVE NEGATIVE Final    Comment:        The GeneXpert MRSA Assay (FDA approved for NASAL specimens only), is one component of a comprehensive MRSA colonization surveillance program. It is not intended to diagnose MRSA infection nor to guide or monitor treatment for MRSA infections. Performed at [facility], [address] [place], [place] [address]   Aerobic/Anaerobic Culture (surgical/deep wound)     Status: None (Preliminary result)   Collection Time: 06/13/20  2:23 PM   Specimen: Wound; Abscess  Result Value Ref Range Status   Specimen Description   Final    WOUND Performed at [facility], [address] [place], [place] [address]    Special Requests   Final    RIGHT ARM PERI GRAFT ABSCESS Performed at [facility], [place], [place] [address]    Gram Stain NO WBC SEEN NO ORGANISMS SEEN   Final   Culture   Final    TOO YOUNG TO READ Performed at [facility], [address], [place], [place] [address]    Report Status PENDING  Incomplete    Coagulation Studies: Recent Labs    06/13/20 0532  LABPROT 15.3*  INR 1.3*    Urinalysis: No results for input(s): COLORURINE, LABSPEC, PHURINE, GLUCOSEU, HGBUR, BILIRUBINUR, KETONESUR, PROTEINUR, UROBILINOGEN, NITRITE, LEUKOCYTESUR in the last 72 hours.  Invalid input(s): APPERANCEUR    Imaging: MR BRAIN WO CONTRAST  Result Date: 06/13/2020 CLINICAL DATA:  Follow-up  examination for stroke. EXAM: MRI HEAD WITHOUT CONTRAST TECHNIQUE: Multiplanar, multiecho pulse sequences of the brain and surrounding structures were obtained without intravenous contrast. COMPARISON:  Prior head CT from 06/11/2020. FINDINGS: Brain: Cerebral volume within normal limits for age. Mild hazy T2/FLAIR hyperintensity noted within the periventricular white matter, nonspecific, but most like related chronic microvascular ischemic disease, felt to be within normal limits for age. Few small remote lacunar infarcts noted within the pons. No abnormal foci of restricted diffusion to suggest acute or subacute ischemia. Gray-white matter differentiation maintained. No encephalomalacia to suggest chronic cortical infarction. Single punctate chronic microhemorrhage noted within the left cerebellum, of doubtful significance in isolation. No other foci of susceptibility artifact to suggest acute or chronic intracranial hemorrhage. No mass lesion, midline shift or mass effect. No hydrocephalus or extra-axial fluid collection. Incidental note made of a partially empty sella. Midline structures intact. Vascular: Major intracranial vascular flow voids are maintained. Skull  and upper cervical spine: Craniocervical junction within normal limits. Diffusely decreased T1 signal intensity seen within the visualized bone marrow, nonspecific, but most commonly related to anemia, smoking, or obesity. No focal marrow replacing lesion. No scalp soft tissue abnormality. Sinuses/Orbits: Patient status post bilateral ocular lens replacement. Globes and orbital soft tissues demonstrate no acute finding. Mild mucosal thickening noted within the left ethmoidal air cells. Paranasal sinuses are otherwise largely clear. Fluid noted layering within the nasopharynx. Patient is intubated. No significant mastoid effusion. Other: None. IMPRESSION: 1. No acute intracranial abnormality. 2. Few small remote lacunar infarcts involving the pons. 3.  Otherwise normal brain MRI for age. Electronically Signed   By: Jeannine Boga M.D.   On: 06/13/2020 02:08   DG Chest Port 1 View  Result Date: 06/14/2020 CLINICAL DATA:  Hypoxia. EXAM: PORTABLE CHEST 1 VIEW COMPARISON:  June 12, 2020 FINDINGS: Endotracheal tube tip is 5.2 cm above the carina. No pneumothorax. There is atelectatic change in the left base. There is no edema or airspace opacity. Heart is borderline enlarged with pulmonary vascularity normal. No adenopathy. There is aortic atherosclerosis. There is a left subclavian and axillary stent. IMPRESSION: Endotracheal tube as described without pneumothorax. Left base atelectasis. Lungs elsewhere clear. Stable cardiac prominence. Aortic Atherosclerosis (ICD10-I70.0). Electronically Signed   By: Lowella Grip III M.D.   On: 06/14/2020 08:06     Medications:   . ampicillin (OMNIPEN) IV 2 g (06/14/20 0828)  . famotidine (PEPCID) IV 20 mg (06/14/20 0937)  . [START ON 06/15/2020] gentamicin    . propofol (DIPRIVAN) infusion 40 mcg/kg/min (06/14/20 0539)   . aspirin EC  81 mg Oral Daily  . atorvastatin  20 mg Oral Daily  . chlorhexidine gluconate (MEDLINE KIT)  15 mL Mouth Rinse BID  . Chlorhexidine Gluconate Cloth  6 each Topical Daily  . cholestyramine light  4 g Oral BID  . dextrose  1 ampule Intravenous Once  . epoetin (EPOGEN/PROCRIT) injection  10,000 Units Intravenous Q T,Th,Sa-HD  . ferrous sulfate  325 mg Oral Daily  . gabapentin  100 mg Oral QPM  . heparin  5,000 Units Subcutaneous Q8H  . hydrOXYzine  25 mg Oral Daily  . lipase/protease/amylase  12,000 Units Oral TID WC  . mouth rinse  15 mL Mouth Rinse 10 times per day  . multivitamin  1 tablet Oral Daily  . cyanocobalamin  1,000 mcg Oral Daily   acetaminophen **OR** acetaminophen, calcium carbonate, camphor-menthol **AND** hydrOXYzine, feeding supplement (NEPRO CARB STEADY), hydrALAZINE, ipratropium-albuterol, loperamide, morphine injection, morphine injection,  naLOXone (NARCAN)  injection, ondansetron **OR** ondansetron (ZOFRAN) IV, oxyCODONE-acetaminophen, promethazine, sorbitol, zolpidem  Assessment/ Plan:  [name] is a 59 y.o. black female with ESRD on HD MWF, hypertension, COPD, diastolic heart failure, depression, diabetes mellitus type 2, hyperlipidemia, pancreatic insufficiency with new dialysis access placement 04/12/2020 who is   [facility] MWF [facility] [place] 81kg.left AVG  1. ESRD on HD MWF  Hemodialysis treatments currently on TTS schedule. Plan on treatment later today. Orders prepared.  - Patient had right femoral temp dialysis catheter placed 9/37 - Complication of dialysis device: appreciate vascular input. Removal of Hero AVG on 9/22 by Dr. Delana Meyer  2. Anemia of chronic kidney disease.Hemoglobin 9.1 Continue epogen 10000 units IV with HD.  3. Secondary hyperparathyroidism.received IV phosphorus replacement Holding home sevelamer   4. Enterococcal bacetermia: On ampicillin and gentamicin. TEE negative for vegetations. Source is suspected to be patient's hero AVG.   - appreciate vascular, ID and critical care  input.   5. Hypertension: heart rate now at goal.  - PRN hydralazine.  - discontinued carvedilol   LOS: 11 [name] 9/23/202111:50 AM

## 2020-06-14 NOTE — Anesthesia Postprocedure Evaluation (Signed)
Anesthesia Post Note  Patient: [name]  Procedure(s) Performed: LIGATION / REMOVAL OF RIGHT HERO GRAFT (Right Arm Lower)  Patient location during evaluation: SICU Anesthesia Type: General Level of consciousness: sedated Pain management: pain level controlled Vital Signs Assessment: post-procedure vital signs reviewed and stable Respiratory status: patient remains intubated per anesthesia plan and patient on ventilator - see flowsheet for VS Cardiovascular status: stable Postop Assessment: no apparent nausea or vomiting Anesthetic complications: no   No complications documented.   Last Vitals:  Vitals:   06/14/20 0600 06/14/20 0700  BP: (!) 130/55 (!) 129/54  Pulse: 72 73  Resp: (!) 23 18  Temp:    SpO2: 97% 99%    Last Pain:  Vitals:   06/14/20 0000  TempSrc: Oral  PainSc:                  Lia Foyer

## 2020-06-15 LAB — PREPARE RBC (CROSSMATCH)

## 2020-06-15 LAB — GLUCOSE, CAPILLARY
Glucose-Capillary: 110 mg/dL — ABNORMAL HIGH (ref 70–99)
Glucose-Capillary: 120 mg/dL — ABNORMAL HIGH (ref 70–99)
Glucose-Capillary: 83 mg/dL (ref 70–99)
Glucose-Capillary: 98 mg/dL (ref 70–99)
Glucose-Capillary: 99 mg/dL (ref 70–99)
Glucose-Capillary: 99 mg/dL (ref 70–99)

## 2020-06-15 LAB — HEMOGLOBIN AND HEMATOCRIT, BLOOD
HCT: 22.1 % — ABNORMAL LOW (ref 36.0–46.0)
Hemoglobin: 7.4 g/dL — ABNORMAL LOW (ref 12.0–15.0)

## 2020-06-15 LAB — RENAL FUNCTION PANEL
Albumin: 2 g/dL — ABNORMAL LOW (ref 3.5–5.0)
Anion gap: 11 (ref 5–15)
BUN: 13 mg/dL (ref 8–23)
CO2: 29 mmol/L (ref 22–32)
Calcium: 7 mg/dL — ABNORMAL LOW (ref 8.9–10.3)
Chloride: 96 mmol/L — ABNORMAL LOW (ref 98–111)
Creatinine, Ser: 3.96 mg/dL — ABNORMAL HIGH (ref 0.44–1.00)
GFR calc Af Amer: 13 mL/min — ABNORMAL LOW (ref >60)
GFR calc non Af Amer: 12 mL/min — ABNORMAL LOW (ref >60)
Glucose, Bld: 85 mg/dL (ref 70–99)
Phosphorus: 3.2 mg/dL (ref 2.5–4.6)
Potassium: 3 mmol/L — ABNORMAL LOW (ref 3.5–5.1)
Sodium: 136 mmol/L (ref 135–145)

## 2020-06-15 LAB — MAGNESIUM: Magnesium: 1.5 mg/dL — ABNORMAL LOW (ref 1.7–2.4)

## 2020-06-15 LAB — CBC
HCT: 18.6 % — ABNORMAL LOW (ref 36.0–46.0)
Hemoglobin: 6.4 g/dL — ABNORMAL LOW (ref 12.0–15.0)
MCH: 29.2 pg (ref 26.0–34.0)
MCHC: 34.4 g/dL (ref 30.0–36.0)
MCV: 84.9 fL (ref 80.0–100.0)
Platelets: 180 10*3/uL (ref 150–400)
RBC: 2.19 MIL/uL — ABNORMAL LOW (ref 3.87–5.11)
RDW: 17.8 % — ABNORMAL HIGH (ref 11.5–15.5)
WBC: 26.8 10*3/uL — ABNORMAL HIGH (ref 4.0–10.5)
nRBC: 0 % (ref 0.0–0.2)

## 2020-06-15 LAB — SURGICAL PATHOLOGY

## 2020-06-15 MED ORDER — SODIUM CHLORIDE 0.9% IV SOLUTION: Freq: Once | INTRAVENOUS | Status: AC

## 2020-06-15 MED ORDER — ENSURE ENLIVE PO LIQD
237.0000 mL | Freq: Two times a day (BID) | ORAL | Status: DC
  Administered 2020-06-15 – 2020-06-21 (×7): 237 mL via ORAL

## 2020-06-15 MED ORDER — GENTAMICIN IN SALINE 1.6-0.9 MG/ML-% IV SOLN: 80.0000 mg | INTRAVENOUS | Status: DC

## 2020-06-15 NOTE — Progress Notes (Signed)
[facility]  ROUNDING NOTE   Subjective:   Hemodialysis treatment yesterday. UF of 535m.  Extubated yesterday. Alert and oriented.   Wbc 26.8 (31.8 ) hgb 6.4 (7.7)  K 2.7 - post dialysis treatment   Objective:  Vital signs in last 24 hours:  Temp:  [97.6 F (36.4 C)-99.7 F (37.6 C)] 99.4 F (37.4 C) (09/24 0415) Pulse Rate:  [67-87] 75 (09/24 0700) Resp:  [17-28] 24 (09/24 0700) BP: (90-159)/(34-59) 120/41 (09/24 0700) SpO2:  [95 %-100 %] 100 % (09/24 0700) FiO2 (%):  [21 %] 21 % (09/23 1346) Weight:  [81.4 kg] 81.4 kg (09/24 0417)  Weight change: -0.8 kg Filed Weights   06/13/20 0500 06/14/20 0500 06/15/20 0417  Weight: 76.3 kg 82.2 kg 81.4 kg    Intake/Output: I/O last 3 completed shifts: In: 678.1 [I.V.:380.4; IV Piggyback:297.7] Out: 500 [Other:500]   Intake/Output this shift:  No intake/output data recorded.  Physical Exam: General: NAD  Head: [place]/AT   Eyes: Anicteric  Neck: trachea midline  Lungs:  Clear bilaterally, 2L [place]  Heart: regular  Abdomen:  Soft  Extremities:  no peripheral edema.  Neurologic: Alert and oriented  Skin: No lesions  Access: Right femoral temporary HD catheter 9/18 Dr. ELorenso Courier   Basic Metabolic Panel: Recent Labs  Lab 06/10/20 2119 06/11/20 0998309/20/21 1431 06/11/20 1431 06/12/20 0425 06/12/20 0425 06/13/20 0532 06/14/20 0726 06/14/20 1648 06/15/20 0505  NA 135   < > 134*  --  134*  --  136  138 132* 137  --   K 2.9*   < > 4.2  --  3.4*  --  3.4*  3.3* 5.3* 2.7*  --   CL 96*   < > 96*  --  97*  --  97*  96* 95* 95*  --   CO2 27   < > 23  --  23  --  26  27 20* 29  --   GLUCOSE 98   < > 121*  --  91  --  83  88 109* 91  --   BUN 12   < > 16  --  18  --  14  13 20 8   --   CREATININE 4.04*   < > 5.28*  --  5.53*  --  4.39*  4.36* 5.74* 2.25*  --   CALCIUM 7.8*   < > 8.3*   < > 8.3*   < > 7.6*  7.9* 7.1* 7.4*  --   MG  --   --   --   --  1.7  --  1.8 1.6*  --  1.5*  PHOS 1.5*  --   --   --   1.9*  --  3.2 5.3* 2.1*  --    < > = values in this interval not displayed.    Liver Function Tests: Recent Labs  Lab 06/10/20 2119 06/12/20 0425 06/13/20 0532 06/14/20 0726 06/14/20 1648  ALBUMIN 2.7* 2.8* 2.4* 2.3* 2.3*   No results for input(s): LIPASE, AMYLASE in the last 168 hours. No results for input(s): AMMONIA in the last 168 hours.  CBC: Recent Labs  Lab 06/12/20 0425 06/13/20 0532 06/14/20 0726 06/14/20 1648 06/15/20 0505  WBC 10.1 12.0* 26.9* 31.8* 26.8*  HGB 9.0* 10.2* 9.1* 7.7* 6.4*  HCT 27.7* 30.5* 26.1* 21.6* 18.6*  MCV 88.8 87.4 82.1 83.7 84.9  PLT 195 209 207 203 180    Cardiac Enzymes: No results for input(s): CKTOTAL, CKMB,  CKMBINDEX, TROPONINI in the last 168 hours.  BNP: Invalid input(s): POCBNP  CBG: Recent Labs  Lab 06/14/20 1637 06/14/20 1959 06/14/20 2356 06/15/20 0001 06/15/20 0411  GLUCAP 74 72 78 83 98    Microbiology: Results for orders placed or performed during the hospital encounter of 06/02/20  SARS Coronavirus 2 by RT PCR (hospital order, performed in [facility] hospital lab) Nasopharyngeal Nasopharyngeal Swab     Status: None   Collection Time: 06/03/20  6:49 AM   Specimen: Nasopharyngeal Swab  Result Value Ref Range Status   SARS Coronavirus 2 NEGATIVE NEGATIVE Final    Comment: (NOTE) SARS-CoV-2 target nucleic acids are NOT DETECTED.  The SARS-CoV-2 RNA is generally detectable in upper and lower respiratory specimens during the acute phase of infection. The lowest concentration of SARS-CoV-2 viral copies this assay can detect is 250 copies / mL. A negative result does not preclude SARS-CoV-2 infection and should not be used as the sole basis for treatment or other patient management decisions.  A negative result may occur with improper specimen collection / handling, submission of specimen other than nasopharyngeal swab, presence of viral mutation(s) within the areas targeted by this assay, and inadequate number  of viral copies (<250 copies / mL). A negative result must be combined with clinical observations, patient history, and epidemiological information.  Fact Sheet for Patients:   StrictlyIdeas.no  Fact Sheet for Healthcare Providers: BankingDealers.co.za  This test is not yet approved or  cleared by the [place] FDA and has been authorized for detection and/or diagnosis of SARS-CoV-2 by FDA under an Emergency Use Authorization (EUA).  This EUA will remain in effect (meaning this test can be used) for the duration of the COVID-19 declaration under Section 564(b)(1) of the Act, 21 U.S.C. section 360bbb-3(b)(1), unless the authorization is terminated or revoked sooner.  Performed at [facility], [address] [place], [place] [address]   Gastrointestinal Panel by PCR , Stool     Status: None   Collection Time: 06/04/20  7:39 PM   Specimen: Stool  Result Value Ref Range Status   Campylobacter species NOT DETECTED NOT DETECTED Final   Plesimonas shigelloides NOT DETECTED NOT DETECTED Final   Salmonella species NOT DETECTED NOT DETECTED Final   Yersinia enterocolitica NOT DETECTED NOT DETECTED Final   Vibrio species NOT DETECTED NOT DETECTED Final   Vibrio cholerae NOT DETECTED NOT DETECTED Final   Enteroaggregative E coli (EAEC) NOT DETECTED NOT DETECTED Final   Enteropathogenic E coli (EPEC) NOT DETECTED NOT DETECTED Final   Enterotoxigenic E coli (ETEC) NOT DETECTED NOT DETECTED Final   Shiga like toxin producing E coli (STEC) NOT DETECTED NOT DETECTED Final   Shigella/Enteroinvasive E coli (EIEC) NOT DETECTED NOT DETECTED Final   Cryptosporidium NOT DETECTED NOT DETECTED Final   Cyclospora cayetanensis NOT DETECTED NOT DETECTED Final   Entamoeba histolytica NOT DETECTED NOT DETECTED Final   Giardia lamblia NOT DETECTED NOT DETECTED Final   Adenovirus F40/41 NOT DETECTED NOT DETECTED Final   Astrovirus NOT DETECTED  NOT DETECTED Final   Norovirus GI/GII NOT DETECTED NOT DETECTED Final   Rotavirus A NOT DETECTED NOT DETECTED Final   Sapovirus (I, II, IV, and V) NOT DETECTED NOT DETECTED Final    Comment: Performed at [facility], [address] [place], [place] [address]  Culture, blood (Routine X 2) w Reflex to ID Panel     Status: Abnormal   Collection Time: 06/04/20  8:49 PM   Specimen: BLOOD  Result Value Ref Range Status   Specimen Description   Final    BLOOD BLOOD LEFT FOREARM Performed at [facility], [address] [place], [place] [address]    Special Requests   Final    BOTTLES DRAWN AEROBIC AND ANAEROBIC Blood Culture adequate volume Performed at [facility], [address] [place], [place] [address]    Culture  Setup Time   Final    IN BOTH AEROBIC AND ANAEROBIC BOTTLES GRAM POSITIVE COCCI Organism ID to follow CRITICAL RESULT CALLED TO, READ BACK BY AND VERIFIED WITH: CHARLES SHANLEVER @1636  06/06/20 MJU LISA KLUTTZ AT 1224 ON 06/07/2020 [place]. Performed at [facility], [place] [address], [place], [place] [address]    Culture ENTEROCOCCUS FAECALIS (A)  Final   Report Status 06/08/2020 FINAL  Final   Organism ID, Bacteria ENTEROCOCCUS FAECALIS  Final      Susceptibility   Enterococcus faecalis - MIC*    AMPICILLIN <=2 SENSITIVE Sensitive     VANCOMYCIN 1 SENSITIVE Sensitive     GENTAMICIN SYNERGY SENSITIVE Sensitive     * ENTEROCOCCUS FAECALIS  Blood Culture ID Panel (Reflexed)     Status: Abnormal   Collection Time: 06/04/20  8:49 PM  Result Value Ref Range Status   Enterococcus faecalis DETECTED (A) NOT DETECTED Final    Comment: CRITICAL RESULT CALLED TO, READ BACK BY AND VERIFIED WITH: CHARLES SHANLEVER @1636  06/06/20 MJU    Enterococcus Faecium NOT DETECTED NOT DETECTED Final   Listeria monocytogenes NOT DETECTED NOT DETECTED Final   Staphylococcus species NOT DETECTED NOT DETECTED Final   Staphylococcus aureus (BCID) NOT DETECTED  NOT DETECTED Final   Staphylococcus epidermidis NOT DETECTED NOT DETECTED Final   Staphylococcus lugdunensis NOT DETECTED NOT DETECTED Final   Streptococcus species NOT DETECTED NOT DETECTED Final   Streptococcus agalactiae NOT DETECTED NOT DETECTED Final   Streptococcus pneumoniae NOT DETECTED NOT DETECTED Final   Streptococcus pyogenes NOT DETECTED NOT DETECTED Final   A.calcoaceticus-baumannii NOT DETECTED NOT DETECTED Final   Bacteroides fragilis NOT DETECTED NOT DETECTED Final   Enterobacterales NOT DETECTED NOT DETECTED Final   Enterobacter cloacae complex NOT DETECTED NOT DETECTED Final   Escherichia coli NOT DETECTED NOT DETECTED Final   Klebsiella aerogenes NOT DETECTED NOT DETECTED Final   Klebsiella oxytoca NOT DETECTED NOT DETECTED Final   Klebsiella pneumoniae NOT DETECTED NOT DETECTED Final   Proteus species NOT DETECTED NOT DETECTED Final   Salmonella species NOT DETECTED NOT DETECTED Final   Serratia marcescens NOT DETECTED NOT DETECTED Final   Haemophilus influenzae NOT DETECTED NOT DETECTED Final   Neisseria meningitidis NOT DETECTED NOT DETECTED Final   Pseudomonas aeruginosa NOT DETECTED NOT DETECTED Final   Stenotrophomonas maltophilia NOT DETECTED NOT DETECTED Final   Candida albicans NOT DETECTED NOT DETECTED Final   Candida auris NOT DETECTED NOT DETECTED Final   Candida glabrata NOT DETECTED NOT DETECTED Final   Candida krusei NOT DETECTED NOT DETECTED Final   Candida parapsilosis NOT DETECTED NOT DETECTED Final   Candida tropicalis NOT DETECTED NOT DETECTED Final   Cryptococcus neoformans/gattii NOT DETECTED NOT DETECTED Final   Vancomycin resistance NOT DETECTED NOT DETECTED Final    Comment: Performed at [facility], [address] [place],  [address]  Culture, blood (Routine X 2) w Reflex to ID Panel     Status: Abnormal   Collection Time: 06/05/20  1:25 PM   Specimen: BLOOD  Result Value Ref Range Status   Specimen Description  Final    BLOOD LAC Performed at [facility], [address] [place], [place] [address]    Special Requests   Final    BOTTLES DRAWN AEROBIC AND ANAEROBIC Blood Culture adequate volume Performed at [facility], [address] [place], [place] [address]    Culture  Setup Time   Final    Organism ID to follow ANAEROBIC BOTTLE ONLY CRITICAL RESULT CALLED TO, READ BACK BY AND VERIFIED WITH: JASON ROBBINS 06/07/20 AT 1506 BY ACR GRAM POSITIVE COCCI PREVIOUSLY REPORTED AS: GRAM NEGATIVE COCCOBACILLI CORRECTED RESULTS CALLED TO: D,Ziegler PHARMD, at [place] 06/07/20 BY E. BENTON    Culture (A)  Final    ENTEROCOCCUS FAECALIS SUSCEPTIBILITIES PERFORMED ON PREVIOUS CULTURE WITHIN THE LAST 5 DAYS. Performed at [facility], [place] [address], [place], [place] [address]    Report Status 06/08/2020 FINAL  Final  Blood Culture ID Panel (Reflexed)     Status: Abnormal   Collection Time: 06/05/20  1:25 PM  Result Value Ref Range Status   Enterococcus faecalis DETECTED (A) NOT DETECTED Final    Comment: CRITICAL RESULT CALLED TO, READ BACK BY AND VERIFIED WITH: JASON ROBBINS 06/07/20 AT 1506 BY ACR    Enterococcus Faecium NOT DETECTED NOT DETECTED Final   Listeria monocytogenes NOT DETECTED NOT DETECTED Final   Staphylococcus species NOT DETECTED NOT DETECTED Final   Staphylococcus aureus (BCID) NOT DETECTED NOT DETECTED Final   Staphylococcus epidermidis NOT DETECTED NOT DETECTED Final   Staphylococcus lugdunensis NOT DETECTED NOT DETECTED Final   Streptococcus species NOT DETECTED NOT DETECTED Final   Streptococcus agalactiae NOT DETECTED NOT DETECTED Final   Streptococcus pneumoniae NOT DETECTED NOT DETECTED Final   Streptococcus pyogenes NOT DETECTED NOT DETECTED Final   A.calcoaceticus-baumannii NOT DETECTED NOT DETECTED Final   Bacteroides fragilis NOT DETECTED NOT DETECTED Final   Enterobacterales NOT DETECTED NOT DETECTED Final   Enterobacter cloacae complex NOT  DETECTED NOT DETECTED Final   Escherichia coli NOT DETECTED NOT DETECTED Final   Klebsiella aerogenes NOT DETECTED NOT DETECTED Final   Klebsiella oxytoca NOT DETECTED NOT DETECTED Final   Klebsiella pneumoniae NOT DETECTED NOT DETECTED Final   Proteus species NOT DETECTED NOT DETECTED Final   Salmonella species NOT DETECTED NOT DETECTED Final   Serratia marcescens NOT DETECTED NOT DETECTED Final   Haemophilus influenzae NOT DETECTED NOT DETECTED Final   Neisseria meningitidis NOT DETECTED NOT DETECTED Final   Pseudomonas aeruginosa NOT DETECTED NOT DETECTED Final   Stenotrophomonas maltophilia NOT DETECTED NOT DETECTED Final   Candida albicans NOT DETECTED NOT DETECTED Final   Candida auris NOT DETECTED NOT DETECTED Final   Candida glabrata NOT DETECTED NOT DETECTED Final   Candida krusei NOT DETECTED NOT DETECTED Final   Candida parapsilosis NOT DETECTED NOT DETECTED Final   Candida tropicalis NOT DETECTED NOT DETECTED Final   Cryptococcus neoformans/gattii NOT DETECTED NOT DETECTED Final   Vancomycin resistance NOT DETECTED NOT DETECTED Final    Comment: Performed at [facility], [address] [place], [place] [address]  CULTURE, BLOOD (ROUTINE X 2) w Reflex to ID Panel     Status: None   Collection Time: 06/07/20  7:02 PM   Specimen: BLOOD  Result Value Ref Range Status   Specimen Description BLOOD BLOOD LEFT FOREARM  Final   Special Requests   Final    BOTTLES DRAWN AEROBIC AND ANAEROBIC Blood Culture results may not be optimal due to an inadequate volume of blood received in culture  bottles   Culture   Final    NO GROWTH 5 DAYS Performed at [facility], [address] [place], [place] [address]    Report Status 06/12/2020 FINAL  Final  CULTURE, BLOOD (ROUTINE X 2) w Reflex to ID Panel     Status: None   Collection Time: 06/07/20  8:46 PM   Specimen: BLOOD  Result Value Ref Range Status   Specimen Description BLOOD BLOOD LEFT HAND  Final   Special  Requests   Final    BOTTLES DRAWN AEROBIC ONLY Blood Culture results may not be optimal due to an inadequate volume of blood received in culture bottles   Culture   Final    NO GROWTH 5 DAYS Performed at [facility], [address] [place], [place] [address]    Report Status 06/12/2020 FINAL  Final  MRSA PCR Screening     Status: None   Collection Time: 06/11/20 10:40 AM   Specimen: Nasopharyngeal  Result Value Ref Range Status   MRSA by PCR NEGATIVE NEGATIVE Final    Comment:        The GeneXpert MRSA Assay (FDA approved for NASAL specimens only), is one component of a comprehensive MRSA colonization surveillance program. It is not intended to diagnose MRSA infection nor to guide or monitor treatment for MRSA infections. Performed at [facility], [address] [place], [place] [address]   Aerobic/Anaerobic Culture (surgical/deep wound)     Status: None (Preliminary result)   Collection Time: 06/13/20  2:23 PM   Specimen: Wound; Abscess  Result Value Ref Range Status   Specimen Description   Final    WOUND Performed at [facility], [address] [place], [place] [address]    Special Requests   Final    RIGHT ARM PERI GRAFT ABSCESS Performed at [facility], [place], [place] [address]    Gram Stain NO WBC SEEN NO ORGANISMS SEEN   Final   Culture   Final    TOO YOUNG TO READ Performed at [facility], [address], [place], [place] [address]    Report Status PENDING  Incomplete    Coagulation Studies: Recent Labs    06/13/20 0532  LABPROT 15.3*  INR 1.3*    Urinalysis: No results for input(s): COLORURINE, LABSPEC, PHURINE, GLUCOSEU, HGBUR, BILIRUBINUR, KETONESUR, PROTEINUR, UROBILINOGEN, NITRITE, LEUKOCYTESUR in the last 72 hours.  Invalid input(s): APPERANCEUR    Imaging: DG Chest Port 1 View  Result Date: 06/14/2020 CLINICAL DATA:  Hypoxia. EXAM: PORTABLE CHEST 1 VIEW COMPARISON:   June 12, 2020 FINDINGS: Endotracheal tube tip is 5.2 cm above the carina. No pneumothorax. There is atelectatic change in the left base. There is no edema or airspace opacity. Heart is borderline enlarged with pulmonary vascularity normal. No adenopathy. There is aortic atherosclerosis. There is a left subclavian and axillary stent. IMPRESSION: Endotracheal tube as described without pneumothorax. Left base atelectasis. Lungs elsewhere clear. Stable cardiac prominence. Aortic Atherosclerosis (ICD10-I70.0). Electronically Signed   By: Lowella Grip III M.D.   On: 06/14/2020 08:06     Medications:   . ampicillin (OMNIPEN) IV 2 g (06/14/20 2116)  . dextrose 60 mL/hr at 06/14/20 1827  . famotidine (PEPCID) IV Stopped (06/14/20 1008)  . gentamicin    . norepinephrine (LEVOPHED) Adult infusion Stopped (06/14/20 1732)   . aspirin EC  81 mg Oral Daily  . atorvastatin  20 mg Oral Daily  . chlorhexidine gluconate (MEDLINE KIT)  15 mL Mouth Rinse BID  . Chlorhexidine Gluconate Cloth  6 each Topical Daily  . cholestyramine light  4 g Oral BID  . dextrose  1 ampule Intravenous Once  . epoetin (EPOGEN/PROCRIT) injection  10,000 Units Intravenous Q T,Th,Sa-HD  . ferrous sulfate  325 mg Oral Daily  . gabapentin  100 mg Oral QPM  . heparin  5,000 Units Subcutaneous Q8H  . hydrOXYzine  25 mg Oral Daily  . lipase/protease/amylase  12,000 Units Oral TID WC  . mouth rinse  15 mL Mouth Rinse 10 times per day  . multivitamin  1 tablet Oral Daily  . cyanocobalamin  1,000 mcg Oral Daily   acetaminophen **OR** acetaminophen, calcium carbonate, camphor-menthol **AND** hydrOXYzine, fentaNYL (SUBLIMAZE) injection, hydrALAZINE, ipratropium-albuterol, loperamide, naLOXone (NARCAN)  injection, ondansetron **OR** ondansetron (ZOFRAN) IV, oxyCODONE-acetaminophen, promethazine, sorbitol, zolpidem  Assessment/ Plan:  [name] is a 59 y.o. black female with ESRD on HD MWF, hypertension, COPD, diastolic  heart failure, depression, diabetes mellitus type 2, hyperlipidemia, pancreatic insufficiency with new dialysis access placement 04/12/2020 who is   [facility] MWF [facility]  81kg.left AVG  1. ESRD on HD MWF  Hemodialysis treatments currently on TTS schedule due to dialysis staffing.  No indication for dialysis treatment today. Plan on treatment tomorrow and then transition to MWF schedule next week  - Patient had right femoral temp dialysis catheter placed 1/58 - Complication of dialysis device: appreciate vascular input. Removal of Hero AVG on 9/22 by Dr. Delana Meyer. - Patient will need tunneled dialysis catheter prior to discharge  2. Anemia of chronic kidney disease.Hemoglobin 6.4 Continue epogen 10000 units IV with HD. - PRBC transfusion for today.   3. Secondary hyperparathyroidism.received IV phosphorus replacement Holding home sevelamer   4. Enterococcal bacetermia: On ampicillin and gentamicin. TEE negative for vegetations. Source is suspected to be patient's hero AVG.   - appreciate vascular, ID, cardiology and critical care input.   5. Hypertension: heart rate now at goal. Off vasopressors.  - PRN hydralazine.  - discontinued carvedilol due to bradycardia  6. Hypokalemia: secondary to hemodialysis. Patient has history of hyperkalemia. No indication to replace.    LOS: 12 Atilla Zollner 9/24/20217:43 AM

## 2020-06-15 NOTE — Progress Notes (Signed)
Patient Name: [name] Date of Encounter: 06/15/2020  Hospital Problem List     Principal Problem:   Bacteremia due to Enterococcus Active Problems:   Acute respiratory failure with hypoxia (HCC)   Anemia of chronic disease   Acute diastolic CHF (congestive heart failure) (HCC)   COPD (chronic obstructive pulmonary disease) (HCC)   ESRD on hemodialysis (HCC)   Benign essential HTN   Obesity (BMI 30-39.9)    Patient Profile      59 y.o. female with history of end-stage renal disease on hemodialysis, hypertension, diabetes, coronary disease, chronic obstructive pulmonary disease as well as peripheral vascular disease who was admitted with fever chills shortness of breath and noted to be hypoxic. Due to persistent positive blood cultures was felt to need transesophageal echo to evaluate for endocarditis. TEE was complicated by transient bradadycardia and hypotension. Received neo, epi and atropine and was intubated. No obvious endocarditis on tee. Currently extubated and responds appropriately.    Subjective   Somewhat lethargic but responds appropriately to commands and is non focal.   Inpatient Medications    . aspirin EC  81 mg Oral Daily  . atorvastatin  20 mg Oral Daily  . chlorhexidine gluconate (MEDLINE KIT)  15 mL Mouth Rinse BID  . Chlorhexidine Gluconate Cloth  6 each Topical Daily  . cholestyramine light  4 g Oral BID  . dextrose  1 ampule Intravenous Once  . epoetin (EPOGEN/PROCRIT) injection  10,000 Units Intravenous Q T,Th,Sa-HD  . ferrous sulfate  325 mg Oral Daily  . gabapentin  100 mg Oral QPM  . heparin  5,000 Units Subcutaneous Q8H  . hydrOXYzine  25 mg Oral Daily  . lipase/protease/amylase  12,000 Units Oral TID WC  . mouth rinse  15 mL Mouth Rinse 10 times per day  . multivitamin  1 tablet Oral Daily  . cyanocobalamin  1,000 mcg Oral Daily    Vital Signs    Vitals:   06/15/20 0300 06/15/20 0415 06/15/20 0417 06/15/20 0700  BP: (!) 109/43 (!)  112/43  (!) 120/41  Pulse: 81 78  75  Resp: (!) 22 (!) 24  (!) 24  Temp:  99.4 F (37.4 C)    TempSrc:  Oral    SpO2: 100% 100%  100%  Weight:   81.4 kg   Height:        Intake/Output Summary (Last 24 hours) at 06/15/2020 0727 Last data filed at 06/14/2020 1827 Gross per 24 hour  Intake 418.29 ml  Output 500 ml  Net -81.71 ml   Filed Weights   06/13/20 0500 06/14/20 0500 06/15/20 0417  Weight: 76.3 kg 82.2 kg 81.4 kg    Physical Exam    GEN: Well nourished, well developed, in no acute distress.  HEENT: normal.  Neck: Supple, no JVD, carotid bruits, or masses. Cardiac: RRR, no murmurs, rubs, or gallops. No clubbing, cyanosis, edema.  Radials/DP/PT 2+ and equal bilaterally.  Respiratory:  Respirations regular and unlabored, clear to auscultation bilaterally. GI: Soft, nontender, nondistended, BS + x 4. MS: no deformity or atrophy. Skin: warm and dry, no rash. Neuro:  Strength and sensation are intact. Psych: somewhat lethargic  Labs    CBC Recent Labs    06/14/20 1648 06/15/20 0505  WBC 31.8* 26.8*  HGB 7.7* 6.4*  HCT 21.6* 18.6*  MCV 83.7 84.9  PLT 203 038   Basic Metabolic Panel Recent Labs    06/14/20 0726 06/14/20 1648 06/15/20 0505  NA 132* 137  --  K 5.3* 2.7*  --   CL 95* 95*  --   CO2 20* 29  --   GLUCOSE 109* 91  --   BUN 20 8  --   CREATININE 5.74* 2.25*  --   CALCIUM 7.1* 7.4*  --   MG 1.6*  --  1.5*  PHOS 5.3* 2.1*  --    Liver Function Tests Recent Labs    06/14/20 0726 06/14/20 1648  ALBUMIN 2.3* 2.3*   No results for input(s): LIPASE, AMYLASE in the last 72 hours. Cardiac Enzymes No results for input(s): CKTOTAL, CKMB, CKMBINDEX, TROPONINI in the last 72 hours. BNP No results for input(s): BNP in the last 72 hours. D-Dimer No results for input(s): DDIMER in the last 72 hours. Hemoglobin A1C No results for input(s): HGBA1C in the last 72 hours. Fasting Lipid Panel Recent Labs    06/14/20 0726  TRIG 120   Thyroid  Function Tests No results for input(s): TSH, T4TOTAL, T3FREE, THYROIDAB in the last 72 hours.  Invalid input(s): FREET3  Telemetry    nsr  ECG    nsr  Radiology    DG Chest 1 View  Result Date: 06/05/2020 CLINICAL DATA:  Respiratory distress, pulmonary embolism EXAM: CHEST  1 VIEW COMPARISON:  06/02/2020 FINDINGS: Lung volumes are small and there is bibasilar atelectasis. Superimposed interstitial pulmonary edema has resolved. Tiny bilateral pleural effusions have improved in the interval. No pneumothorax. Cardiac size within normal limits. Right internal jugular he row graft is seen with its tip overlying the right atrium. Vascular stents are seen within the central left upper extremity veins and visualized right upper extremity. Cardiac size within normal limits. IMPRESSION: Small lung volumes with bibasilar atelectasis. Superimposed interstitial pulmonary edema has resolved. Tiny bilateral pleural effusions have improved in the interval. Electronically Signed   By: Fidela Salisbury MD   On: 06/05/2020 16:54   CT HEAD WO CONTRAST  Result Date: 06/11/2020 CLINICAL DATA:  Hypotension during PE.  History of dialysis EXAM: CT HEAD WITHOUT CONTRAST TECHNIQUE: Contiguous axial images were obtained from the base of the skull through the vertex without intravenous contrast. COMPARISON:  Six days ago FINDINGS: Brain: No evidence of acute infarction, hemorrhage, hydrocephalus, extra-axial collection or mass lesion/mass effect. Vascular: Atherosclerotic calcification Skull: Generalized sclerotic appearance of the bones, likely renal. Sinuses/Orbits: Bilateral cataract resection.  No acute finding IMPRESSION: No acute finding. Electronically Signed   By: Monte Fantasia M.D.   On: 06/11/2020 10:36   CT HEAD WO CONTRAST  Result Date: 06/05/2020 CLINICAL DATA:  59 year old female with hypotension, diaphoresis, altered mental status today. In stage renal disease, dialysis. EXAM: CT HEAD WITHOUT CONTRAST  TECHNIQUE: Contiguous axial images were obtained from the base of the skull through the vertex without intravenous contrast. COMPARISON:  Head CT 05/02/2020.  Brain MRI 05/03/2019. FINDINGS: Brain: No midline shift, ventriculomegaly, mass effect, evidence of mass lesion, intracranial hemorrhage or evidence of cortically based acute infarction. Chronic lower pontine lacunar infarcts better demonstrated on MRI last year. Elsewhere gray-white matter differentiation is within normal limits throughout the brain. Vascular: Calcified atherosclerosis at the skull base. No suspicious intracranial vascular hyperdensity. Skull: Stable visualized osseous structures. Scattered small lucent areas in the calvarium (series 3, image 62 at the left vertex) have mildly increased since 2017 and are probably related to metabolic bone disease. Sinuses/Orbits: Visualized paranasal sinuses and mastoids are stable and well pneumatized. Other: No acute orbit or scalp soft tissue finding. Some scalp vessel calcified atherosclerosis is noted. IMPRESSION: 1.  No acute intracranial abnormality. 2. Chronic lacunar infarcts in the pons. 3. Several slowly enlarging small lucent areas in the calvarium since 2017 are probably related to metabolic bone disease in the setting of chronic ESRD. Electronically Signed   By: Genevie Ann M.D.   On: 06/05/2020 16:28   CT CHEST WO CONTRAST  Result Date: 06/11/2020 CLINICAL DATA:  Hypotensive following transesophageal echocardiogram. EXAM: CT CHEST WITHOUT CONTRAST TECHNIQUE: Multidetector CT imaging of the chest was performed following the standard protocol without IV contrast. COMPARISON:  CT scan 05/22/2020 FINDINGS: Cardiovascular: The heart is mildly enlarged but stable. Stable tortuosity and calcification of the thoracic aorta. Stable three-vessel coronary artery calcifications. Right IJ Vas-Cath in good position without complicating features. Left brachiocephalic vein stent is stable. Mediastinum/Nodes:  Stable scattered mediastinal and hilar lymph nodes. The esophagus is grossly normal. Lungs/Pleura: Persistent vascular congestion without overt pulmonary edema. There are small bilateral pleural effusions and lower lobe interstitial changes which have improved. Mild lower lobe peribronchial thickening. No infiltrates or pneumothorax. Upper Abdomen: No significant upper abdominal findings. Stable advanced vascular calcifications. Bilateral renal atrophy. Musculoskeletal: No significant bony findings. IMPRESSION: 1. Persistent vascular congestion without overt pulmonary edema. 2. Small bilateral pleural effusions and lower lobe interstitial changes which have improved. 3. No infiltrates or pneumothorax. 4. Stable advanced atherosclerotic calcifications involving the thoracic and abdominal aorta and branch vessels including the coronary arteries. 5. Aortic atherosclerosis. Aortic Atherosclerosis (ICD10-I70.0). Electronically Signed   By: Marijo Sanes M.D.   On: 06/11/2020 10:46   CT ANGIO CHEST PE W OR WO CONTRAST  Result Date: 05/22/2020 CLINICAL DATA:  Shortness of breath. EXAM: CT ANGIOGRAPHY CHEST WITH CONTRAST TECHNIQUE: Multidetector CT imaging of the chest was performed using the standard protocol during bolus administration of intravenous contrast. Multiplanar CT image reconstructions and MIPs were obtained to evaluate the vascular anatomy. CONTRAST:  24m OMNIPAQUE IOHEXOL 350 MG/ML SOLN COMPARISON:  Feb 16, 2016. FINDINGS: Cardiovascular: Satisfactory opacification of the pulmonary arteries to the segmental level. No evidence of pulmonary embolism. Mild cardiomegaly is noted. Atherosclerosis of thoracic aorta is noted without aneurysm or dissection. Is seen involving the left subclavian and brachiocephalic vein. Its proximal portion appears to be occluded, resulting in collateral circulations the lead to reconstitution of its more distal portion in the brachiocephalic vein. No pericardial effusion.  Mediastinum/Nodes: Thyroid gland and esophagus are unremarkable. Stable enlarged mediastinal and axillary lymph nodes are noted which most likely are reactive or inflammatory in etiology. Lungs/Pleura: No pneumothorax is noted. Small bilateral pleural effusions are noted. Mild interstitial densities are noted in both lung bases concerning for pulmonary edema. Upper Abdomen: Bilateral renal atrophy is noted consistent with history of end-stage renal disease. Catheter is noted in the IVC, with distal tip just inferior to cavoatrial junction. Musculoskeletal: No chest wall abnormality. No acute or significant osseous findings. Review of the MIP images confirms the above findings. IMPRESSION: 1. No definite evidence of pulmonary embolus. 2. There is a occlusion of the proximal portion of stent graft involving the left subclavian and brachiocephalic veins. Collateral circulation is seen leading to reconstitution of its more distal portion. This most likely is a chronic finding. 3. Stable enlarged mediastinal and axillary lymph nodes are noted which most likely are reactive or inflammatory in etiology. 4. Small bilateral pleural effusions are noted. 5. Mild interstitial densities are noted in both lung bases concerning for pulmonary edema. 6. Bilateral renal atrophy is noted consistent with history of end-stage renal disease. 7. Aortic atherosclerosis. Aortic Atherosclerosis (ICD10-I70.0).  Electronically Signed   By: Marijo Conception M.D.   On: 05/22/2020 12:02   MR BRAIN WO CONTRAST  Result Date: 06/13/2020 CLINICAL DATA:  Follow-up examination for stroke. EXAM: MRI HEAD WITHOUT CONTRAST TECHNIQUE: Multiplanar, multiecho pulse sequences of the brain and surrounding structures were obtained without intravenous contrast. COMPARISON:  Prior head CT from 06/11/2020. FINDINGS: Brain: Cerebral volume within normal limits for age. Mild hazy T2/FLAIR hyperintensity noted within the periventricular white matter, nonspecific,  but most like related chronic microvascular ischemic disease, felt to be within normal limits for age. Few small remote lacunar infarcts noted within the pons. No abnormal foci of restricted diffusion to suggest acute or subacute ischemia. Gray-white matter differentiation maintained. No encephalomalacia to suggest chronic cortical infarction. Single punctate chronic microhemorrhage noted within the left cerebellum, of doubtful significance in isolation. No other foci of susceptibility artifact to suggest acute or chronic intracranial hemorrhage. No mass lesion, midline shift or mass effect. No hydrocephalus or extra-axial fluid collection. Incidental note made of a partially empty sella. Midline structures intact. Vascular: Major intracranial vascular flow voids are maintained. Skull and upper cervical spine: Craniocervical junction within normal limits. Diffusely decreased T1 signal intensity seen within the visualized bone marrow, nonspecific, but most commonly related to anemia, smoking, or obesity. No focal marrow replacing lesion. No scalp soft tissue abnormality. Sinuses/Orbits: Patient status post bilateral ocular lens replacement. Globes and orbital soft tissues demonstrate no acute finding. Mild mucosal thickening noted within the left ethmoidal air cells. Paranasal sinuses are otherwise largely clear. Fluid noted layering within the nasopharynx. Patient is intubated. No significant mastoid effusion. Other: None. IMPRESSION: 1. No acute intracranial abnormality. 2. Few small remote lacunar infarcts involving the pons. 3. Otherwise normal brain MRI for age. Electronically Signed   By: Jeannine Boga M.D.   On: 06/13/2020 02:08   NM Pulmonary Perfusion  Result Date: 06/05/2020 CLINICAL DATA:  Left chest pain EXAM: NUCLEAR MEDICINE PERFUSION LUNG SCAN TECHNIQUE: Perfusion images were obtained in multiple projections after intravenous injection of radiopharmaceutical. Ventilation scans intentionally  deferred if perfusion scan and chest x-ray adequate for interpretation during COVID 19 epidemic. RADIOPHARMACEUTICALS:  4.724 mCi Tc-24mMAA IV COMPARISON:  None. Findings are correlated with concurrently performed chest radiograph FINDINGS: There is no significant perfusion defect identified. IMPRESSION: Negative for pulmonary embolism Electronically Signed   By: AFidela SalisburyMD   On: 06/05/2020 16:56   PERIPHERAL VASCULAR CATHETERIZATION  Result Date: 05/25/2020 See Op Note  [place]ER EXTREMITY ARTERIAL RIGHT LIMITED (GRAFT, SINGLE VESSEL)  Result Date: 06/09/2020 CLINICAL DATA:  59year old female with new swelling at the dialysis access. EXAM: RIGHT UPPER EXTREMITY ARTERIAL DUPLEX SCAN TECHNIQUE: Gray-scale sonography as well as color Doppler and duplex ultrasound was performed to evaluate a limited evaluation of the upper extremity. COMPARISON:  None. FINDINGS: Duplex performed in the region clinical concern. Flow confirmed within the dialysis circuit. No volume measurement or velocities were measured. Heterogeneous fluid present adjacent to the graft within the soft tissues. No internal color flow to suggest a patent pseudoaneurysm. Estimated dimension of the fluid 3.3 cm x 2.5 cm x 3.5 cm. IMPRESSION: Heterogeneous fluid collection adjacent to patent right upper extremity dialysis graft, with the differential including blood products/hematoma versus abscess given the history. Electronically Signed   By: JCorrie MckusickD.O.   On: 06/09/2020 10:23   DG Chest Port 1 View  Result Date: 06/14/2020 CLINICAL DATA:  Hypoxia. EXAM: PORTABLE CHEST 1 VIEW COMPARISON:  June 12, 2020 FINDINGS: Endotracheal tube  tip is 5.2 cm above the carina. No pneumothorax. There is atelectatic change in the left base. There is no edema or airspace opacity. Heart is borderline enlarged with pulmonary vascularity normal. No adenopathy. There is aortic atherosclerosis. There is a left subclavian and axillary stent.  IMPRESSION: Endotracheal tube as described without pneumothorax. Left base atelectasis. Lungs elsewhere clear. Stable cardiac prominence. Aortic Atherosclerosis (ICD10-I70.0). Electronically Signed   By: Lowella Grip III M.D.   On: 06/14/2020 08:06   DG Chest Port 1 View  Result Date: 06/12/2020 CLINICAL DATA:  Acute respiratory failure EXAM: PORTABLE CHEST 1 VIEW COMPARISON:  None. FINDINGS: Endotracheal tube is been placed with its tip 5.1 cm above the carina. Right internal jugular HeRO graft venous limb is seen with its tip within the right atrium. Left upper extremity central venous stenting has been performed. Minimal left basilar atelectasis. Lungs are otherwise clear. No pneumothorax or pleural effusion. Cardiac size within normal limits. Pulmonary vascularity is normal. IMPRESSION: No active disease. Electronically Signed   By: Fidela Salisbury MD   On: 06/12/2020 05:18   DG Chest Port 1 View  Result Date: 06/07/2020 CLINICAL DATA:  RIGHT-sided chest pain and rib pain for 5 days EXAM: PORTABLE CHEST 1 VIEW COMPARISON:  Chest x-ray 06/05/2020 FINDINGS: RIGHT-sided dialysis catheter terminates in the upper RIGHT atrium, no change from the previous study. Stenting of LEFT axillary and subclavian venous system also unchanged. Heart size remains enlarged. Lung volumes mild bibasilar airspace disease. No acute skeletal process on limited assessment. IMPRESSION: 1. Low lung volumes with mild bibasilar airspace disease similar to prior. 2. Mild interstitial prominence may reflect mild pulmonary edema though is similar to the previous evaluation. Findings remain improved when compared to 06/02/2020. 3. No acute findings in the chest. 4. Stable cardiomegaly. Electronically Signed   By: Zetta Bills M.D.   On: 06/07/2020 11:17   DG Chest Portable 1 View  Result Date: 06/02/2020 CLINICAL DATA:  Shortness of breath EXAM: PORTABLE CHEST 1 VIEW COMPARISON:  CT 05/22/2020, radiograph 05/22/2020 FINDINGS:  Bilateral pleural thickening and small basilar effusions. Mixed interstitial and patchy opacities towards the mid to lower lungs likely reflecting a combination of interstitial and developing alveolar edema with central cuffing, vascular cephalization and congestion. Cardiomegaly is similar to priors. Evidence of prior bilateral vascular stenting in the left brachiocephalic/subclavian vein and right upper arm. A right upper extremity HeRO graft is also noted, terminating at the level the right atrium. Removal of a right lower extremity central venous catheter. Telemetry leads overlie the chest. Mild body wall edema. No other acute or suspicious soft tissue or osseous abnormality. Degenerative changes are present in the imaged spine and shoulders. IMPRESSION: Features of CHF/volume overload with cardiomegaly, edema and small bilateral effusions. More patchy opacities could reflect alveolar edema versus underlying infection. Electronically Signed   By: Lovena Le M.D.   On: 06/02/2020 18:37   DG Chest Portable 1 View  Result Date: 05/22/2020 CLINICAL DATA:  Severe chest pain EXAM: PORTABLE CHEST 1 VIEW COMPARISON:  11/15/2019 FINDINGS: Diffuse interstitial opacity similar to prior. Chronic cardiomegaly. Probable trace pleural effusions. Dialysis catheters from above and below on the right. Left-sided venous stenting. IMPRESSION: Chronic/recurrent pulmonary edema and trace pleural effusions. No acute finding when compared to prior. Electronically Signed   By: Monte Fantasia M.D.   On: 05/22/2020 09:03   ECHOCARDIOGRAM COMPLETE  Result Date: 06/07/2020    ECHOCARDIOGRAM REPORT   Patient Name:   [name] Date of Exam:  06/07/2020 Medical Rec #:  [id number]   Height:       65.0 in Accession #:    [id number]  Weight:       197.5 lb Date of Birth:  08/17/1961    BSA:          1.968 m Patient Age:    28 years    BP:           133/52 mmHg Patient Gender: F           HR:           63 bpm. Exam Location:  [facility]  Procedure: 2D Echo, Cardiac Doppler and Color Doppler Indications:     Bacteremia 790.7  History:         Patient has prior history of Echocardiogram examinations, most                  recent 02/24/2020. Previous Myocardial Infarction, COPD; Risk                  Factors:Diabetes. Mitral Regurgitation.  Sonographer:     Sherrie Sport RDCS (AE) Referring Phys:  [id number] Tsosie Billing Diagnosing Phys: Bartholome Bill MD  Sonographer Comments: Suboptimal apical window. IMPRESSIONS  1. Left ventricular ejection fraction, by estimation, is 60 to 65%. The left ventricle has normal function. The left ventricle has no regional wall motion abnormalities. There is mild left ventricular hypertrophy. Left ventricular diastolic parameters are consistent with Grade I diastolic dysfunction (impaired relaxation).  2. Right ventricular systolic function was not well visualized. The right ventricular size is mildly enlarged.  3. Right atrial size was mildly dilated.  4. The mitral valve is grossly normal. Trivial mitral valve regurgitation.  5. Tricuspid valve regurgitation is moderate.  6. The aortic valve is tricuspid. Aortic valve regurgitation is not visualized. FINDINGS  Left Ventricle: Left ventricular ejection fraction, by estimation, is 60 to 65%. The left ventricle has normal function. The left ventricle has no regional wall motion abnormalities. The left ventricular internal cavity size was normal in size. There is  mild left ventricular hypertrophy. Left ventricular diastolic parameters are consistent with Grade I diastolic dysfunction (impaired relaxation). Right Ventricle: The right ventricular size is mildly enlarged. Right vetricular wall thickness was not well visualized. Right ventricular systolic function was not well visualized. Left Atrium: Left atrial size was normal in size. Right Atrium: Right atrial size was mildly dilated. Pericardium: There is no evidence of pericardial effusion. Mitral Valve: The mitral  valve is grossly normal. Trivial mitral valve regurgitation. Tricuspid Valve: The tricuspid valve is grossly normal. Tricuspid valve regurgitation is moderate. Aortic Valve: The aortic valve is tricuspid. Aortic valve regurgitation is not visualized. Aortic valve mean gradient measures 5.0 mmHg. Aortic valve peak gradient measures 8.0 mmHg. Aortic valve area, by VTI measures 1.68 cm. Pulmonic Valve: The pulmonic valve was not well visualized. Pulmonic valve regurgitation is trivial. Aorta: The aortic root was not well visualized. IAS/Shunts: The interatrial septum was not assessed.  LEFT VENTRICLE PLAX 2D LVIDd:         4.60 cm  Diastology LVIDs:         2.89 cm  LV e' medial:    6.20 cm/s LV PW:         0.95 cm  LV E/e' medial:  17.3 LV IVS:        0.98 cm  LV e' lateral:   9.03 cm/s LVOT diam:     2.00 cm  LV E/e'  lateral: 11.8 LV SV:         52 LV SV Index:   27 LVOT Area:     3.14 cm  RIGHT VENTRICLE RV Basal diam:  5.30 cm RV S prime:     12.00 cm/s TAPSE (M-mode): 3.5 cm LEFT ATRIUM             Index       RIGHT ATRIUM           Index LA diam:        3.70 cm 1.88 cm/m  RA Area:     29.00 cm LA Vol (A2C):   75.4 ml 38.32 ml/m RA Volume:   120.00 ml 60.98 ml/m LA Vol (A4C):   34.6 ml 17.58 ml/m LA Biplane Vol: 53.2 ml 27.04 ml/m  AORTIC VALVE                    PULMONIC VALVE AV Area (Vmax):    1.86 cm     PV Vmax:        0.59 m/s AV Area (Vmean):   1.65 cm     PV Peak grad:   1.4 mmHg AV Area (VTI):     1.68 cm     RVOT Peak grad: 2 mmHg AV Vmax:           141.00 cm/s AV Vmean:          103.350 cm/s AV VTI:            0.310 m AV Peak Grad:      8.0 mmHg AV Mean Grad:      5.0 mmHg LVOT Vmax:         83.60 cm/s LVOT Vmean:        54.200 cm/s LVOT VTI:          0.166 m LVOT/AV VTI ratio: 0.54  AORTA Ao Root diam: 2.40 cm MITRAL VALVE                TRICUSPID VALVE MV Area (PHT): 3.60 cm     TR Peak grad:   60.2 mmHg MV Decel Time: 211 msec     TR Vmax:        388.00 cm/s MV E velocity: 107.00 cm/s MV  A velocity: 104.00 cm/s  SHUNTS MV E/A ratio:  1.03         Systemic VTI:  0.17 m                             Systemic Diam: 2.00 cm Bartholome Bill MD Electronically signed by Bartholome Bill MD Signature Date/Time: 06/07/2020/10:42:33 AM    Final    ECHO TEE  Result Date: 06/11/2020    TRANSESOPHOGEAL ECHO REPORT   Patient Name:   [name] Date of Exam: 06/11/2020 Medical Rec #:  [id number]   Height:       65.0 in Accession #:    [id number]  Weight:       190.5 lb Date of Birth:  [birth year]    BSA:          1.938 m Patient Age:    59 years    BP:           147/52 mmHg Patient Gender: F           HR:           56 bpm. Exam Location:  [facility] Procedure: Transesophageal  Echo, Cardiac Doppler and Color Doppler Indications:     Bacteremia 790.7  History:         Patient has prior history of Echocardiogram examinations, most                  recent 06/07/2020. COPD, Signs/Symptoms:Dyspnea; Risk                  Factors:Hypertension.  Sonographer:     Sherrie Sport RDCS (AE) Referring Phys:  [id number] Teodoro Spray Diagnosing Phys: Bartholome Bill MD PROCEDURE: The transesophogeal probe was passed without difficulty through the esophogus of the patient. Sedation performed by different physician. The patient was monitored while under deep sedation. Image quality was excellent. The patient developed Respiratory depression and hypotension and bradycardia during the procedure. IMPRESSIONS  1. Left ventricular ejection fraction, by estimation, is 50 to 55%. The left ventricle has normal function.  2. Right ventricular systolic function is normal. The right ventricular size is normal.  3. Left atrial size was mildly dilated. No left atrial/left atrial appendage thrombus was detected.  4. Right atrial size was mildly dilated.  5. The mitral valve is grossly normal. Mild to moderate mitral valve regurgitation.  6. Tricuspid valve regurgitation is mild to moderate.  7. The aortic valve is tricuspid. Aortic valve regurgitation is not visualized.  FINDINGS  Left Ventricle: Left ventricular ejection fraction, by estimation, is 50 to 55%. The left ventricle has normal function. The left ventricular internal cavity size was normal in size. Right Ventricle: The right ventricular size is normal. No increase in right ventricular wall thickness. Right ventricular systolic function is normal. Left Atrium: Left atrial size was mildly dilated. No left atrial/left atrial appendage thrombus was detected. Right Atrium: Right atrial size was mildly dilated. Pericardium: There is no evidence of pericardial effusion. Mitral Valve: The mitral valve is grossly normal. Mild to moderate mitral valve regurgitation. There is no evidence of mitral valve vegetation. Tricuspid Valve: The tricuspid valve is grossly normal. Tricuspid valve regurgitation is mild to moderate. There is no evidence of tricuspid valve vegetation. Aortic Valve: The aortic valve is tricuspid. Aortic valve regurgitation is not visualized. There is no evidence of aortic valve vegetation. Pulmonic Valve: The pulmonic valve was not well visualized. Pulmonic valve regurgitation is not visualized. Aorta: The aortic root is normal in size and structure. IAS/Shunts: The interatrial septum was not assessed. Bartholome Bill MD Electronically signed by Bartholome Bill MD Signature Date/Time: 06/11/2020/12:41:00 PM    Final     Assessment & Plan    Bacteremia-No obvious endocarditis on echo or tee. On abx.   Hypotension and bradycarda-no further noted. Hemodynamically stable.  Encephalopathy-improved markedly. Extubated and responds to commands appropriately.   Dr. Humphrey Rolls will be covering our patients this weekend.    Signed, Javier Docker Dosia Yodice MD 06/15/2020, 7:27 AM  Pager: [phone]

## 2020-06-15 NOTE — Consult Note (Signed)
Pharmacy Antibiotic Note  [name] is a 59 y.o. female admitted on 06/02/2020 with respiratory failure secondary to acute diastolic CHF and COPD exacerbation. PMH includes ESRD on HD, HTN, DM, CAD, PCD.  Pharmacy has been consulted for ampicillin and gentamicin dosing.  Patient with ESRD on HD with numerous graft complications. Erythemous firm swelling in right cubital area of graft arm.  Graft site noted as possible source of bloodstream infection, removed 9/22.  Today, 06/15/2020 Day #7 Gentamicin - AVG removed 9/22 so no dialysis performed - HD 9/23 - AVG culture too young to read -9/21 Gentamicin level obtained at 0425 and resulted as 3 (goal peak: 3-4 mg/L, goal trough <1) s/p LD of 100mg  9/17 at 1800 and MD of 80 mg 9/20 at 0026 w/ one 2 hour dialysis session on 9/19 with BFR 250 (max: 400). Full dialysis session planned for today  TTE 9/16: moderate tricuspid valve regurgitation, no vegetation visualized  TEE 9/20: mild to moderate tricuspid valve regurgitation, no vegetation visualized. Procedure complicated by hypotension, respiratory depression, and bradycardia.    Cultures this admission:  BCx 9/13: Enterococcus faecalis, amp sensitive, gent synergy sensitive BCx 9/14: Enterococcus faecalis  BCID 9/14: Enterococcus faecalis detected  BCx 9/16: NGTD in 4/4 bottles  Plan:  Plan for HD 9/25 per nephrology then hopefully back to MWF on 9/27.  Continue gentamicin 80mg  following full dialysis sessions, next dose ordered for 9/25  F/u graft culture,  ID may d/c gentamicin based on culture results.   Will plan for gentamicin random level 9/27 am if remains on gentamicin   Monitor gentamicin levels weekly. Monitor signs of ototoxicity (I.e. ringing in ears daily during therapy)   Continue ampicillin 2g IV every 12 hours   Height: 5\' 5"  (165.1 cm) Weight: 81.4 kg (179 lb 7.3 oz) IBW/kg (Calculated) : 57  Temp (24hrs), Avg:98.8 F (37.1 C), Min:97.6 F (36.4 C), Max:99.7 F  (37.6 C)  Recent Labs  Lab 06/11/20 1431 06/12/20 0123 06/12/20 0425 06/13/20 0532 06/14/20 0726 06/14/20 1648 06/15/20 0505  WBC  --    < > 10.1 12.0* 26.9* 31.8* 26.8*  CREATININE 5.28*  --  5.53* 4.39*  4.36* 5.74* 2.25*  --   GENTRANDOM  --   --  3.0  --   --   --   --    < > = values in this interval not displayed.    Estimated Creatinine Clearance: 27.7 mL/min (A) (by C-G formula based on SCr of 2.25 mg/dL (H)).    Allergies  Allergen Reactions  . Ace Inhibitors Swelling and Anaphylaxis  . Ativan [Lorazepam] Other (See Comments)    Reaction:Hallucinations and headaches  . Compazine [Prochlorperazine Edisylate] Anaphylaxis, Nausea And Vomiting and Other (See Comments)    Other reaction(s): dystonia from this vs. Reglan, 23 Jul - patient relates that she takes promethazine frequently with no problems  . Sumatriptan Succinate Other (See Comments)    Other reaction(s): delirium and hallucinations per [facility] records  . Zofran [Ondansetron] Nausea And Vomiting    Per pt. she is allergic to zofran or will experience adverse reaction like hallucination   . Lac Bovis Nausea And Vomiting  . Losartan Nausea Only  . Prochlorperazine Other (See Comments)    Reaction:  Unknown . Patient does not remember reaction but she does have vertigo and anxiety along with n and v at times. Could be used to treat any of these   . Reglan [Metoclopramide] Other (See Comments)    Per  patient her Dr. Evelina Bucy her off it   . Scopolamine Other (See Comments)    Dizziness, also has vertigo already  . Tape Rash    Plastic tape causes rash  . Tapentadol Rash    Antimicrobials this admission: Azithromycin 9/12 >> 9/15 Ceftriaxone 9/12 >> 9/15  Zosyn 9/15 >> 9/17  Ampicillin 9/17 >>  Gentamicin 9/17 >>   Thank you for allowing pharmacy to be a part of this patient's care.  Doreene Eland, PharmD, BCPS.   Work Cell: [phone] 06/15/2020 8:29 AM

## 2020-06-15 NOTE — Progress Notes (Signed)
[facility] Daily Progress Note   Subjective: 06/13/20: 1. Removal of infectedright arm heroAV graft 2. Endarterectomy of theright brachial artery with Cormatrix patch  Extubated. WBC trending down.   Objective: Vitals:   06/15/20 0700 06/15/20 0800 06/15/20 0900 06/15/20 1000  BP: (!) 120/41 (!) 135/46 (!) 128/46 139/63  Pulse: 75 74 65 72  Resp: (!) 24 (!) 23 (!) 22 16  Temp:  99 F (37.2 C)    TempSrc:  Oral    SpO2: 100% 100% 100% 97%  Weight:      Height:        Intake/Output Summary (Last 24 hours) at 06/15/2020 1046 Last data filed at 06/15/2020 0900 Gross per 24 hour  Intake 1405.39 ml  Output 500 ml  Net 905.39 ml   Physical Exam: A&Ox3, NAD CV: RRR Pulmonary: CTA Bilaterally Abdomen: Soft, Nontender, Nondistended Vascular:  Right Upper Extremity: Minimal to moderate swelling. Arm is soft.  Incision: Over dressing was removed. Iodoform from the proximal distal incision removed. Incisions are clean dry intact.   Laboratory: CBC    Component Value Date/Time   WBC 26.8 (H) 06/15/2020 0505   HGB 6.4 (L) 06/15/2020 0505   HGB 10.5 (L) 09/15/2014 0948   HCT 18.6 (L) 06/15/2020 0505   HCT 34.2 (L) 09/15/2014 0948   PLT 180 06/15/2020 0505   PLT 203 09/15/2014 0948   BMET    Component Value Date/Time   NA 136 06/15/2020 0505   NA 135 (L) 09/15/2014 0948   K 3.0 (L) 06/15/2020 0505   K 4.8 09/15/2014 0948   CL 96 (L) 06/15/2020 0505   CL 99 09/15/2014 0948   CO2 29 06/15/2020 0505   CO2 26 09/15/2014 0948   GLUCOSE 85 06/15/2020 0505   GLUCOSE 118 (H) 09/15/2014 0948   BUN 13 06/15/2020 0505   BUN 19 (H) 09/15/2014 0948   CREATININE 3.96 (H) 06/15/2020 0505   CREATININE 6.79 (H) 09/15/2014 0948   CALCIUM 7.0 (L) 06/15/2020 0505   CALCIUM 8.3 (L) 09/15/2014 0948   GFRNONAA 12 (L) 06/15/2020 0505   GFRNONAA 7 (L) 09/15/2014 0948   GFRNONAA 6 (L) 05/31/2014 0432   GFRAA 13 (L) 06/15/2020 0505   GFRAA 8 (L) 09/15/2014 0948    GFRAA 7 (L) 05/31/2014 0432   Assessment/Planning: The patient is a 59 year old female multiple medical issues including end-stage renal disease status post right upper extremity hero graft / old graft removed - POD#2  1) Right Upper Extremity is soft. Hand is warm. 2)  OR dressings removed. Incisions clean dry and intact. Iodoform removed from distal proximal incision. Daily dressing changes to proximal/distal incision. Order placed. 3) Currently maintained by a temporary dialysis catheter will need to transition to PermCath in the future  Discussed with Eliot Ford PA-C 06/15/2020 10:46 AM

## 2020-06-15 NOTE — Progress Notes (Signed)
Nutrition Follow Up Note   DOCUMENTATION CODES:   Not applicable  INTERVENTION:   Ensure Enlive po BID, each supplement provides 350 kcal and 20 grams of protein  Rena-vite daily   Pt at high refeed risk; recommend monitor potassium, magnesium and phosphorus labs daily until stable  NUTRITION DIAGNOSIS:   Inadequate oral intake related to inability to eat as evidenced by NPO status. -pt advanced to Dysphagia 1 diet  GOAL:   Patient will meet greater than or equal to 90% of their needs -progressing with diet advancement   MONITOR:   PO intake, Supplement acceptance, Labs, Weight trends, Skin, I & O's  ASSESSMENT:   59 year old female with PMHx of asthma, DM, CAD, ESRD on HD, hx MI, mitral regurgitation, COPD, CHF, anxiety, depression, hx hiatal hernia, GERD, gastroparesis, arthritis, pancreatis insufficiency admitted with enterococcal bacteremia.   Pt extubated yesterday. Pt seen by SLP today and initiated on a dysphagia 1/thin liquid diet. RD will add supplements to help pt meet her estimated needs. Per pt and family report, pt is lactose intolerant. Pt has refused supplements before in the past as she reported that they gave her diarrhea. Pt reporting today that she is willing to drink strawberry Ensure. Pt is at refeed risk. Of note, pt does wear dentures. Per chart, pt is weight stable since admit.   Medications reviewed and include: aspirin, epogen, ferrous sulfate, heparin, creon, rena-vite, B12, ampicillin, 5% dextrose @60ml /hr, pepcid  Labs reviewed: K 3.0(L), creat 3.96(H), P 3.2 wnl, Mg 1.5(L) Wbc- 26.8(H), Hgb 6.4(L), Hct 18.6(L)  Diet Order:   Diet Order            Diet NPO time specified  Diet effective midnight                EDUCATION NEEDS:   No education needs have been identified at this time  Skin:  Skin Assessment: Skin Integrity Issues: (closed incision left arm)  Last BM:  06/14/2020 - type 6  Height:   Ht Readings from Last 1 Encounters:   06/12/20 5\' 5"  (1.651 m)   Weight:   Wt Readings from Last 1 Encounters:  06/15/20 81.4 kg   Ideal Body Weight:  56.8 kg  BMI:  Body mass index is 29.86 kg/m.  Estimated Nutritional Needs:   Kcal:  1800-2100kcal/day  Protein:  90-105g/day  Fluid:  UOP + 1 L  Koleen Distance MS, RD, LDN Please refer to [facility] for RD and/or RD on-call/weekend/after hours pager

## 2020-06-15 NOTE — Progress Notes (Signed)
CRITICAL CARE NOTE 59 y.o.femalewith medical history significant ofend-stage renal disease on hemodialysis Mondays Wednesdays and Fridays, hypertension, COPD, diastolic dysfunction CHF, depression, diabetes, hyperlipidemia, who apparently missed her dialysis on Friday.    9/20 TEE performed Tee revealed no evidence of endocartidits. Procedure complicated by hypotension and bradycaradia with heart rates dropping to mid 20's and blood pressure systolic to 47M. Received 1 amp of atropine, amp of epinephrine and neosynephrine. Pt intubated to protect air way. Stat head ct and chest ct were unremarkable. Pt son notified immediately and has been updated with care. 9/21 remains on vent, plan for SAT/SBT 9/22 plan for OR today  MRI BRAIN-IMPRESSION: 1. No acute intracranial abnormality. 2. Few small remote lacunar infarcts involving the pons. 3. Otherwise normal brain MRI for age. 9/23 HD performed patient extubated after successful SAT/SBT  CC  follow up respiratory failure  HPI Patient extubated yesterday without sequela  Still somewhat confused/encephalopathic Mentation improving though HD yesterday   BP (!) 139/50   Pulse 71   Temp 99.4 F (37.4 C) (Axillary)   Resp 19   Ht 5' 5"  (1.651 m)   Wt 81.4 kg   SpO2 100%   BMI 29.86 kg/m    I/O last 3 completed shifts: In: 678.1 [I.V.:380.4; IV Piggyback:297.7] Out: 500 [Other:500] Total I/O In: 5465 [I.V.:1135; Blood:370; IV Piggyback:250] Out: 0   SpO2: 100 % O2 Flow Rate (L/min): 2 L/min FiO2 (%): 21 %  Estimated body mass index is 29.86 kg/m as calculated from the following:   Height as of this encounter: 5' 5"  (1.651 m).   Weight as of this encounter: 81.4 kg.  REVIEW OF SYSTEMS Difficult to obtain due to mild residual encephalopathy   PHYSICAL EXAMINATION:  GENERAL: Chronically ill-appearing woman, comfortable on nasal cannula O2  HEAD: Normocephalic, atraumatic.  EYES: Pupils equal, round, reactive to  light.  No scleral icterus.  MOUTH: Edentulous, oral mucosa moist. NECK: Supple.  Trachea midline.  No JVD. PULMONARY: Coarse breath sounds throughout, no wheezes or rhonchi noted. CARDIOVASCULAR: S1 and S2. Regular rate and rhythm. No murmurs, rubs, or gallops.  GASTROINTESTINAL: Soft, nontender,non-distended.  Positive bowel sounds.  Tolerating p.o.'s. MUSCULOSKELETAL: No swelling, clubbing, +1 edema.  NEUROLOGIC: Alert, awake, still slightly befuddled at times impulsive. SKIN:intact,warm,dry  Scheduled Meds: . aspirin EC  81 mg Oral Daily  . atorvastatin  20 mg Oral Daily  . chlorhexidine gluconate (MEDLINE KIT)  15 mL Mouth Rinse BID  . Chlorhexidine Gluconate Cloth  6 each Topical Daily  . cholestyramine light  4 g Oral BID  . dextrose  1 ampule Intravenous Once  . epoetin (EPOGEN/PROCRIT) injection  10,000 Units Intravenous Q T,Th,Sa-HD  . feeding supplement (ENSURE ENLIVE)  237 mL Oral BID BM  . ferrous sulfate  325 mg Oral Daily  . gabapentin  100 mg Oral QPM  . heparin  5,000 Units Subcutaneous Q8H  . hydrOXYzine  25 mg Oral Daily  . lipase/protease/amylase  12,000 Units Oral TID WC  . mouth rinse  15 mL Mouth Rinse 10 times per day  . multivitamin  1 tablet Oral Daily  . cyanocobalamin  1,000 mcg Oral Daily   Continuous Infusions: . ampicillin (OMNIPEN) IV Stopped (06/15/20 1020)  . dextrose 60 mL/hr at 06/15/20 1546  . famotidine (PEPCID) IV Stopped (06/15/20 1206)  . norepinephrine (LEVOPHED) Adult infusion Stopped (06/15/20 0959)   PRN Meds:.acetaminophen **OR** acetaminophen, calcium carbonate, camphor-menthol **AND** hydrOXYzine, fentaNYL (SUBLIMAZE) injection, hydrALAZINE, ipratropium-albuterol, loperamide, naLOXone (NARCAN)  injection, ondansetron **OR** ondansetron (  ZOFRAN) IV, oxyCODONE-acetaminophen, promethazine, sorbitol, zolpidem    CULTURE RESULTS   Recent Results (from the past 240 hour(s))  CULTURE, BLOOD (ROUTINE X 2) w Reflex to ID Panel      Status: None   Collection Time: 06/07/20  7:02 PM   Specimen: BLOOD  Result Value Ref Range Status   Specimen Description BLOOD BLOOD LEFT FOREARM  Final   Special Requests   Final    BOTTLES DRAWN AEROBIC AND ANAEROBIC Blood Culture results may not be optimal due to an inadequate volume of blood received in culture bottles   Culture   Final    NO GROWTH 5 DAYS Performed at [facility], [address] [place], [place] [address]    Report Status 06/12/2020 FINAL  Final  CULTURE, BLOOD (ROUTINE X 2) w Reflex to ID Panel     Status: None   Collection Time: 06/07/20  8:46 PM   Specimen: BLOOD  Result Value Ref Range Status   Specimen Description BLOOD BLOOD LEFT HAND  Final   Special Requests   Final    BOTTLES DRAWN AEROBIC ONLY Blood Culture results may not be optimal due to an inadequate volume of blood received in culture bottles   Culture   Final    NO GROWTH 5 DAYS Performed at [facility], [address] [place], [place] [address]    Report Status 06/12/2020 FINAL  Final  MRSA PCR Screening     Status: None   Collection Time: 06/11/20 10:40 AM   Specimen: Nasopharyngeal  Result Value Ref Range Status   MRSA by PCR NEGATIVE NEGATIVE Final    Comment:        The GeneXpert MRSA Assay (FDA approved for NASAL specimens only), is one component of a comprehensive MRSA colonization surveillance program. It is not intended to diagnose MRSA infection nor to guide or monitor treatment for MRSA infections. Performed at [facility], [address] [place], [place] [address]   Aerobic/Anaerobic Culture (surgical/deep wound)     Status: None (Preliminary result)   Collection Time: 06/13/20  2:23 PM   Specimen: Wound; Abscess  Result Value Ref Range Status   Specimen Description   Final    WOUND Performed at [facility], [address], [place], [place] [address]    Special Requests   Final    RIGHT ARM PERI GRAFT ABSCESS Performed  at [facility], [address] [place], [place] [address]    Gram Stain   Final    NO WBC SEEN NO ORGANISMS SEEN Performed at [facility], [place] [address], [place],  [address]    Culture   Final    RARE ENTEROCOCCUS FAECALIS CULTURE REINCUBATED FOR BETTER GROWTH NO ANAEROBES ISOLATED; CULTURE IN PROGRESS FOR 5 DAYS    Report Status PENDING  Incomplete         Indwelling Urinary Catheter N/A  Reason to continue Indwelling Urinary Catheter     Central Line/   Reason to continue Central Line  tunneled right hemodialysis catheter    Ventilator N/A   Ventilator Sedation N/A      ASSESSMENT AND PLAN SYNOPSIS  59 yo  female with multiple medical issues with enterococcus bacteremia s/p TEE with post op complication with near cardiac arrest with acute severe resp failure  ACUTE Hypoxic Respiratory Failuredue to acute hypotension and near cardiac arrest due to sedative medications with underlying multiple medical conditions with severe sepsis and bacteremia  Resolved  ACUTE Hypoxic  Respiratory Failure Hypotension Extubated yesterday after dialysis Comfortable on room air Being weaned off of pressors  ACUTE DIASTOLIC CARDIAC FAILURE -follow up cardiac enzymes as indicated -follow up cardiology recs -Volume management with HD  ESRD on dialysis Had dialysis 9/23 Per renal  Anemia of chronic renal disease Aggravated by acute blood loss (AV graft revision) Received 2 units PRBCs between 9/23 and 9/24 On EPO Continue to monitor Hematoma on AV graft site No evidence of ongoing bleeding  Acute encephalopathy Toxic/metabolic Improving off sedatives Continue to redirect PT/OT in a.m.  ENTEROCOCCUS BACTERAMIA Source appears to have been infected AV hero graft Removal of infected right arm hero AV graft 9/22 Underwent endarterectomy of the right brachial artery 9/22 Continue antibiotics per ID Discussed with Dr.  Delaine Lame  PROPHYLAXIS GI: H2 blocker VTE: Heparin subcu  DIET Aching p.o.'s well  ENDO ICU hyperglycemia protocol as needed   Multidisciplinary rounds rounds were performed with the ICU team  I have updated patient's son at bedside  C. Derrill Kay, MD [facility]   *This note was dictated using voice recognition software/Dragon.  Despite best efforts to proofread, errors can occur which can change the meaning.  Any change was purely unintentional.

## 2020-06-15 NOTE — Progress Notes (Signed)
Date of Admission:  06/02/2020    HD catheter 06/09/20-rt femoral vein Antibiotics Ampicillin Gentamicin  ID: [name] is a 59 y.o. female  Principal Problem:   Bacteremia due to Enterococcus Active Problems:   Acute respiratory failure with hypoxia (HCC)   Anemia of chronic disease   Acute diastolic CHF (congestive heart failure) (HCC)   COPD (chronic obstructive pulmonary disease) (HCC)   ESRD on hemodialysis (HCC)   Benign essential HTN   Obesity (BMI 30-39.9)    Subjective: Extubated today somnolent Opens eyes on calling her name Got PRBC Medications:  . aspirin EC  81 mg Oral Daily  . atorvastatin  20 mg Oral Daily  . chlorhexidine gluconate (MEDLINE KIT)  15 mL Mouth Rinse BID  . Chlorhexidine Gluconate Cloth  6 each Topical Daily  . cholestyramine light  4 g Oral BID  . dextrose  1 ampule Intravenous Once  . epoetin (EPOGEN/PROCRIT) injection  10,000 Units Intravenous Q T,Th,Sa-HD  . ferrous sulfate  325 mg Oral Daily  . gabapentin  100 mg Oral QPM  . heparin  5,000 Units Subcutaneous Q8H  . hydrOXYzine  25 mg Oral Daily  . lipase/protease/amylase  12,000 Units Oral TID WC  . mouth rinse  15 mL Mouth Rinse 10 times per day  . multivitamin  1 tablet Oral Daily  . cyanocobalamin  1,000 mcg Oral Daily    Objective: Vital signs in last 24 hours: Temp:  [97.6 F (36.4 C)-99.7 F (37.6 C)] 98.8 F (37.1 C) (09/24 1110) Pulse Rate:  [65-87] 72 (09/24 1000) Resp:  [16-28] 16 (09/24 1000) BP: (90-159)/(34-63) 135/51 (09/24 1110) SpO2:  [97 %-100 %] 97 % (09/24 1000) FiO2 (%):  [21 %] 21 % (09/23 1346) Weight:  [81.4 kg] 81.4 kg (09/24 0417)  PHYSICAL EXAM:  General:somnolent, pale Head: Normocephalic, without obvious abnormality, atraumatic. Neck: Supple, Lungs: b/l air entry Heart: Tachycardia Abdomen: Soft, non-tender,not distended. Bowel sounds normal. No masses Extremities:rt arm -  covered with dressing Neurologic: cannot assess Lab  Results Recent Labs    06/14/20 1648 06/15/20 0505  WBC 31.8* 26.8*  HGB 7.7* 6.4*  HCT 21.6* 18.6*  NA 137 136  K 2.7* 3.0*  CL 95* 96*  CO2 29 29  BUN 8 13  CREATININE 2.25* 3.96*   Liver Panel Recent Labs    06/14/20 1648 06/15/20 0505  ALBUMIN 2.3* 2.0*   Sedimentation Rate No results for input(s): ESRSEDRATE in the last 72 hours. C-Reactive Protein No results for input(s): CRP in the last 72 hours.  Microbiology:  Studies/Results: DG Chest Port 1 View  Result Date: 06/14/2020 CLINICAL DATA:  Hypoxia. EXAM: PORTABLE CHEST 1 VIEW COMPARISON:  June 12, 2020 FINDINGS: Endotracheal tube tip is 5.2 cm above the carina. No pneumothorax. There is atelectatic change in the left base. There is no edema or airspace opacity. Heart is borderline enlarged with pulmonary vascularity normal. No adenopathy. There is aortic atherosclerosis. There is a left subclavian and axillary stent. IMPRESSION: Endotracheal tube as described without pneumothorax. Left base atelectasis. Lungs elsewhere clear. Stable cardiac prominence. Aortic Atherosclerosis (ICD10-I70.0). Electronically Signed   By: Lowella Grip III M.D.   On: 06/14/2020 08:06     Assessment/Plan: Enterococcus bacteremiasource could be the graft site as there is a firm erythematous swelling rt arm. arterial duplexshowed heterogenous collectionhematoma vs Abscess- had removal of AV graft and Endarterectomy of theright brachial artery with Cormatrix patch on 06/13/20- leucocytosis since 06/14/20 , trending down- could be related  to the blood loss as Hb dropped by > 3 grams currently onamp+ gent combo- TEE no endocarditis repeat blood cultures Neg so far will DC gent today   Bradycardia/hypotension following TEE on 06/11/20 Intubated, CT head and CT chest no acute findings. Extubated today 06/15/20  congestive heart failure and pulmonary edemaVegetation not seen on TEE. 2 d echoshows moderate tricuspid  regurg  Anemia: received PRBC  Hypokalemia   Accelerated hypertension- management as per primary team  h/o DM gastroparesis with nausea/vomiting   ESRD on dialysis   Thrombocytopenia-resolved   Discussed the management with intensivist. ID will follow her peripherally this weekend- call if needed

## 2020-06-15 NOTE — Progress Notes (Signed)
Pt resting in bed with eyes closed. Pt is more oriented through shift but still confused at times. Son is at bedside. All VSS on 2L [place]. Levo gtt has been off since this morning. Pt is anuric and did not receive HD today.

## 2020-06-15 NOTE — Evaluation (Signed)
Clinical/Bedside Swallow Evaluation Patient Details  Name: [name] MRN: [id number] Date of Birth: 03-Jul-1961  Today's Date: 06/15/2020 Time: SLP Start Time (ACUTE ONLY): 1100 SLP Stop Time (ACUTE ONLY): 1200 SLP Time Calculation (min) (ACUTE ONLY): 60 min  Past Medical History:  Past Medical History:  Diagnosis Date  . Anemia   . Anginal pain ([place])   . Anxiety   . Arthritis   . Asthma   . Broken wrist   . Bronchitis   . chronic diastolic CHF 4/65/6812  . Chronic kidney disease   . COPD (chronic obstructive pulmonary disease) ([place])   . Coronary artery disease    a. cath 2013: stenting to RCA (report not available); b. cath 2014: LM nl, pLAD 40%, mLAD nl, ost LCx 40%, mid LCx nl, pRCA 30% @ site of prior stent, mRCA 50%  . Depression   . Diabetes mellitus ([place])   . Diabetes mellitus without complication ([place])   . Diabetic neuropathy ([place])   . dialysis 2006  . Diverticulosis   . Dizziness   . Dyspnea   . Elevated lipids   . Environmental and seasonal allergies   . ESRD (end stage renal disease) on dialysis ([place])    M-W-F  . Gastroparesis   . GERD (gastroesophageal reflux disease)   . Headache   . History of anemia due to chronic kidney disease   . History of hiatal hernia   . HOH (hard of hearing)   . Hx of pancreatitis 2015  . Hypertension   . Lower extremity edema   . Mitral regurgitation    a. echo 10/2013: EF 62%, noWMA, mildly dilated LA, mild to mod MR/TR, GR1DD  . Myocardial infarction ([place])   . Orthopnea   . Parathyroid abnormality ([place])   . Peripheral arterial disease ([place])   . Pneumonia   . Renal cancer ([place])   . Renal insufficiency    Pt is on dialysis on M,W + F.  . Wheezing    Past Surgical History:  Past Surgical History:  Procedure Laterality Date  . A/V SHUNTOGRAM Left 01/20/2018   Procedure: A/V SHUNTOGRAM;  Surgeon: Algernon Huxley, MD;  Location: [place] [facility];  Service: Cardiovascular;  Laterality: Left;  . ABDOMINAL HYSTERECTOMY  1992   . AMPUTATION TOE Left 10/02/2017   Procedure: AMPUTATION TOE-LEFT GREAT TOE;  Surgeon: Albertine Patricia, DPM;  Location: [facility];  Service: Podiatry;  Laterality: Left;  . APPENDECTOMY    . APPLICATION OF WOUND VAC N/A 11/25/2019   Procedure: APPLICATION OF WOUND VAC;  Surgeon: Katha Cabal, MD;  Location: [facility];  Service: Vascular;  Laterality: N/A;  . ARTERY BIOPSY Right 10/11/2018   Procedure: BIOPSY TEMPORAL ARTERY;  Surgeon: Vickie Epley, MD;  Location: [facility];  Service: General;  Laterality: Right;  . CARDIAC CATHETERIZATION Left 07/26/2015   Procedure: Left Heart Cath and Coronary Angiography;  Surgeon: Dionisio David, MD;  Location: [place] [facility];  Service: Cardiovascular;  Laterality: Left;  . CATARACT EXTRACTION W/ INTRAOCULAR LENS IMPLANT Right   . CATARACT EXTRACTION W/PHACO Left 03/10/2017   Procedure: CATARACT EXTRACTION PHACO AND INTRAOCULAR LENS PLACEMENT (IOC);  Surgeon: Birder Robson, MD;  Location: [facility];  Service: Ophthalmology;  Laterality: Left;  [place] 00:51.9 AP% 14.2 CDE 7.39 fluid pack lot # [id number]  . CENTRAL LINE INSERTION Right 11/11/2019   Procedure: CENTRAL LINE INSERTION;  Surgeon: Katha Cabal, MD;  Location: [facility];  Service: Vascular;  Laterality: Right;  .  CENTRAL LINE INSERTION  11/25/2019   Procedure: CENTRAL LINE INSERTION;  Surgeon: Katha Cabal, MD;  Location: [facility];  Service: Vascular;;  . CHOLECYSTECTOMY    . COLONOSCOPY WITH PROPOFOL N/A 08/12/2016   Procedure: COLONOSCOPY WITH PROPOFOL;  Surgeon: Lollie Sails, MD;  Location: [facility];  Service: Endoscopy;  Laterality: N/A;  . DIALYSIS FISTULA CREATION Left    upper arm  . dialysis grafts    . DIALYSIS/PERMA CATHETER INSERTION N/A 11/14/2019   Procedure: DIALYSIS/PERMA CATHETER INSERTION;  Surgeon: Algernon Huxley, MD;  Location: [place] [facility];  Service: Cardiovascular;  Laterality: N/A;  . DIALYSIS/PERMA CATHETER INSERTION N/A 02/03/2020    Procedure: DIALYSIS/PERMA CATHETER INSERTION;  Surgeon: Katha Cabal, MD;  Location: [place] [facility];  Service: Cardiovascular;  Laterality: N/A;  . DIALYSIS/PERMA CATHETER REMOVAL N/A 05/25/2020   Procedure: DIALYSIS/PERMA CATHETER REMOVAL;  Surgeon: Katha Cabal, MD;  Location: [place] [facility];  Service: Cardiovascular;  Laterality: N/A;  . ESOPHAGOGASTRODUODENOSCOPY N/A 03/08/2015   Procedure: ESOPHAGOGASTRODUODENOSCOPY (EGD);  Surgeon: Manya Silvas, MD;  [facility];  Service: Endoscopy;  Laterality: N/A;  . ESOPHAGOGASTRODUODENOSCOPY (EGD) WITH PROPOFOL N/A 03/18/2016   Procedure: ESOPHAGOGASTRODUODENOSCOPY (EGD) WITH PROPOFOL;  Surgeon: [name], MD;  Location: [facility];  Service: Endoscopy;  Laterality: N/A;  . EYE SURGERY Right 2018  . FECAL TRANSPLANT N/A 08/23/2015   Procedure: FECAL TRANSPLANT;  Surgeon: Manya Silvas, MD;  Location: [facility] ENDOSCOPY;  Service: Endoscopy;  Laterality: N/A;  . HAND SURGERY Bilateral   . HEMATOMA EVACUATION Left 11/25/2019   Procedure: EVACUATION HEMATOMA;  Surgeon: Katha Cabal, MD;  Location: [facility];  Service: Vascular;  Laterality: Left;  . I & D EXTREMITY Left 11/25/2019   Procedure: IRRIGATION AND DEBRIDEMENT EXTREMITY;  Surgeon: Katha Cabal, MD;  Location: [facility];  Service: Vascular;  Laterality: Left;  . IR FLUORO GUIDE CV LINE RIGHT  04/06/2020  . IR RADIOLOGIST EVAL & MGMT  07/28/2019  . IR RADIOLOGIST EVAL & MGMT  08/11/2019  . LIGATION OF ARTERIOVENOUS  FISTULA Left 11/11/2019  . LIGATION OF ARTERIOVENOUS  FISTULA Left 11/11/2019   Procedure: LIGATION OF ARTERIOVENOUS  FISTULA;  Surgeon: Katha Cabal, MD;  Location: [facility];  Service: Vascular;  Laterality: Left;  . LIGATIONS OF HERO GRAFT Right 06/13/2020   Procedure: LIGATION / REMOVAL OF RIGHT HERO GRAFT;  Surgeon: Katha Cabal, MD;  Location: [facility];  Service: Vascular;  Laterality: Right;  . PERIPHERAL VASCULAR  CATHETERIZATION N/A 12/20/2015   Procedure: Thrombectomy of dialysis access versus permcath placement;  Surgeon: Algernon Huxley, MD;  Location: [place] [facility];  Service: Cardiovascular;  Laterality: N/A;  . PERIPHERAL VASCULAR CATHETERIZATION N/A 12/20/2015   Procedure: A/V Shunt Intervention;  Surgeon: Algernon Huxley, MD;  Location: [place] [facility];  Service: Cardiovascular;  Laterality: N/A;  . PERIPHERAL VASCULAR CATHETERIZATION N/A 12/20/2015   Procedure: A/V Shuntogram/Fistulagram;  Surgeon: Algernon Huxley, MD;  Location: [place] [facility];  Service: Cardiovascular;  Laterality: N/A;  . PERIPHERAL VASCULAR CATHETERIZATION N/A 01/02/2016   Procedure: A/V Shuntogram/Fistulagram;  Surgeon: Algernon Huxley, MD;  Location: [place] [facility];  Service: Cardiovascular;  Laterality: N/A;  . PERIPHERAL VASCULAR CATHETERIZATION N/A 01/02/2016   Procedure: A/V Shunt Intervention;  Surgeon: Algernon Huxley, MD;  Location: [place] [facility];  Service: Cardiovascular;  Laterality: N/A;  . TEE WITHOUT CARDIOVERSION N/A 06/11/2020   Procedure: TRANSESOPHAGEAL ECHOCARDIOGRAM (TEE);  Surgeon: Ubaldo Glassing,  Javier Docker, MD;  Location: [facility];  Service: Cardiovascular;  Laterality: N/A;  . UPPER EXTREMITY VENOGRAPHY Right 01/18/2020   Procedure: UPPER EXTREMITY VENOGRAPHY;  Surgeon: Katha Cabal, MD;  Location: [place] [facility];  Service: Cardiovascular;  Laterality: Right;  Marland Kitchen VASCULAR ACCESS DEVICE INSERTION Right 04/13/2020   Procedure: INSERTION OF HERO VASCULAR ACCESS DEVICE (GRAFT);  Surgeon: Katha Cabal, MD;  Location: [facility];  Service: Vascular;  Laterality: Right;   HPI:  Pt is a 59 y.o. female with medical history significant of end-stage renal disease on hemodialysis Mondays Wednesdays and Fridays, hypertension, COPD, diastolic dysfunction CHF, depression, diabetes, hyperlipidemia, who apparently missed her dialysis on Friday.  Came to the ER with complaining of significant shortness of breath and  cough.  Patient was noted to have evidence of fluid overload.  Blood pressure also in the 200s with obvious hypertensive urgency.  Nephrology consulted and plan to do hemodialysis tonight.  Patient being admitted to the medical service for observation.  She is complaining of significant pain back.  Pt intubated to protect air way during TEE; no endocarditis noted.  Removal of infected right arm hero AV graft; sepsis and bacteremia noted; ACUTE Hypoxic and Hypercapnic Respiratory Failure post op.  Pt remained intubated and was extubated on 06/14/2020.  A/O x2; blood transfusion ongoing during this eval.    Assessment / Plan / Recommendation Clinical Impression  Pt appears to present w/ adequate oropharyngeal phase swallow w/ No immediate oropharyngeal phase dysphagia noted, No neuromuscular swallowing deficits noted.Pt consumed po trials w/ No overt, clinical s/s of aspiration during po trials. Pt was min distracted/drowsy w/ eyes closed intermittently but appears at reduced risk for aspiration when following general aspiration precautions. During po trials, pt consumed thin liquids, ice chips, and puree consistencies w/ no overt coughing, decline in vocal quality, or change in respiratory presentation during/post trials. O2 sats remained upper 90s during/post trials. Oral phase appeared [facility] w/ timely bolus management and control of bolus propulsion for A-P transfer for swallowing. Oral clearing achieved w/ all trial consistencies. No solids were given d/t pt's decreased alertness and energy for such task(blood transfusion ongoing). OM Exam appeared [facility] w/ no unilateral weakness noted. Speech Clear. Pt helped to hold cup when drinking. Recommend a puree consistency diet to initiate w/ gravies to moistent; Thin liquids. Recommend general aspiration precautions, Pills WHOLE in Puree for safer, easier swallowing. Education given on Pills in Puree; food consistencies and easy to eat options w/out Dentures today;  general aspiration precautions. ST services will f/u tomorrow for diet upgrade as pt tolerates. NSG/Son agreed.  SLP Visit Diagnosis: Dysphagia, unspecified (R13.10)    Aspiration Risk  Mild aspiration risk;Risk for inadequate nutrition/hydration (reduced following precautions)    Diet Recommendation  Dysphagia level 1 w/ gravies; Thin liquids. General aspiration precautions. Feeding support at meals.  Medication Administration: Whole meds with puree (for safer swallowing at this time)    Other  Recommendations Recommended Consults:  (Dietician f/u) Oral Care Recommendations: Oral care BID;Oral care before and after PO;Staff/trained caregiver to provide oral care Other Recommendations:  (n/a)   Follow up Recommendations None (expected)      Frequency and Duration min 3x week  2 weeks       Prognosis Prognosis for Safe Diet Advancement: Good Barriers to Reach Goals: Time post onset;Severity of deficits      Swallow Study   General Date of Onset: 06/02/20 HPI: Pt is a 59 y.o. female with medical history significant  of end-stage renal disease on hemodialysis Mondays Wednesdays and Fridays, hypertension, COPD, diastolic dysfunction CHF, depression, diabetes, hyperlipidemia, who apparently missed her dialysis on Friday.  Came to the ER with complaining of significant shortness of breath and cough.  Patient was noted to have evidence of fluid overload.  Blood pressure also in the 200s with obvious hypertensive urgency.  Nephrology consulted and plan to do hemodialysis tonight.  Patient being admitted to the medical service for observation.  She is complaining of significant pain back.  Pt intubated to protect air way during TEE; no endocarditis noted.  Removal of infected right arm hero AV graft; sepsis and bacteremia noted; ACUTE Hypoxic and Hypercapnic Respiratory Failure post op.  Pt remained intubated and was extubated on 06/14/2020.  A/O x2; blood transfusion ongoing during this eval.   Type of Study: Bedside Swallow Evaluation Previous Swallow Assessment: none Diet Prior to this Study: NPO (regular diet at home) Temperature Spikes Noted: No (wbc 26.8 declining) Respiratory Status: Nasal cannula (2L) History of Recent Intubation: Yes Length of Intubations (days): 4 days Date extubated: 06/14/20 Behavior/Cognition: Alert;Cooperative;Pleasant mood;Distractible;Requires cueing (Son present) Oral Cavity Assessment: Within Functional Limits Oral Care Completed by SLP: Yes Oral Cavity - Dentition: Edentulous (dentures in room) Vision:  (fair) Self-Feeding Abilities: Needs assist;Needs set up;Total assist Patient Positioning: Upright in bed (needed complete positioning) Baseline Vocal Quality: Normal (mumbled at times) Volitional Cough: Strong Volitional Swallow: Able to elicit    Oral/Motor/Sensory Function Overall Oral Motor/Sensory Function: Within functional limits   Ice Chips Ice chips: Within functional limits Presentation: Spoon (3 trials; fed)   Thin Liquid Thin Liquid: Within functional limits Presentation: Self Fed;Straw (supported; ~6+ ozs) Other Comments: water, ensure    Nectar Thick Nectar Thick Liquid: Not tested   Honey Thick Honey Thick Liquid: Not tested   Puree Puree: Within functional limits Presentation: Spoon (fed; 2 trials) Other Comments: did not like   Solid     Solid: Not tested Presentation:  (too drowsy)        [name], [place], CCC-SLP Speech Language Pathologist Rehab Services [phone] Shaylin Blatt 06/15/2020,1:57 PM

## 2020-06-16 ENCOUNTER — Inpatient Hospital Stay: Payer: Medicare HMO

## 2020-06-16 ENCOUNTER — Encounter: Payer: Self-pay | Admitting: Internal Medicine

## 2020-06-16 DIAGNOSIS — E1142 Type 2 diabetes mellitus with diabetic polyneuropathy: Secondary | ICD-10-CM | POA: Diagnosis not present

## 2020-06-16 DIAGNOSIS — G894 Chronic pain syndrome: Secondary | ICD-10-CM | POA: Diagnosis not present

## 2020-06-16 LAB — RENAL FUNCTION PANEL
Albumin: 2.2 g/dL — ABNORMAL LOW (ref 3.5–5.0)
Anion gap: 11 (ref 5–15)
BUN: 21 mg/dL (ref 8–23)
CO2: 28 mmol/L (ref 22–32)
Calcium: 7.6 mg/dL — ABNORMAL LOW (ref 8.9–10.3)
Chloride: 96 mmol/L — ABNORMAL LOW (ref 98–111)
Creatinine, Ser: 4.84 mg/dL — ABNORMAL HIGH (ref 0.44–1.00)
GFR calc Af Amer: 10 mL/min — ABNORMAL LOW (ref >60)
GFR calc non Af Amer: 9 mL/min — ABNORMAL LOW (ref >60)
Glucose, Bld: 113 mg/dL — ABNORMAL HIGH (ref 70–99)
Phosphorus: 4.3 mg/dL (ref 2.5–4.6)
Potassium: 3.1 mmol/L — ABNORMAL LOW (ref 3.5–5.1)
Sodium: 135 mmol/L (ref 135–145)

## 2020-06-16 LAB — CBC
HCT: 23.3 % — ABNORMAL LOW (ref 36.0–46.0)
Hemoglobin: 7.9 g/dL — ABNORMAL LOW (ref 12.0–15.0)
MCH: 29.7 pg (ref 26.0–34.0)
MCHC: 33.9 g/dL (ref 30.0–36.0)
MCV: 87.6 fL (ref 80.0–100.0)
Platelets: 181 10*3/uL (ref 150–400)
RBC: 2.66 MIL/uL — ABNORMAL LOW (ref 3.87–5.11)
RDW: 16.9 % — ABNORMAL HIGH (ref 11.5–15.5)
WBC: 13.9 10*3/uL — ABNORMAL HIGH (ref 4.0–10.5)
nRBC: 0 % (ref 0.0–0.2)

## 2020-06-16 LAB — GLUCOSE, CAPILLARY
Glucose-Capillary: 100 mg/dL — ABNORMAL HIGH (ref 70–99)
Glucose-Capillary: 105 mg/dL — ABNORMAL HIGH (ref 70–99)
Glucose-Capillary: 115 mg/dL — ABNORMAL HIGH (ref 70–99)
Glucose-Capillary: 115 mg/dL — ABNORMAL HIGH (ref 70–99)
Glucose-Capillary: 137 mg/dL — ABNORMAL HIGH (ref 70–99)
Glucose-Capillary: 98 mg/dL (ref 70–99)

## 2020-06-16 LAB — MAGNESIUM: Magnesium: 1.7 mg/dL (ref 1.7–2.4)

## 2020-06-16 MED ORDER — PANTOPRAZOLE SODIUM 40 MG IV SOLR
40.0000 mg | INTRAVENOUS | Status: DC
  Administered 2020-06-16 – 2020-06-21 (×5): 40 mg via INTRAVENOUS
  Filled 2020-06-16 (×5): qty 40

## 2020-06-16 MED ORDER — HALOPERIDOL LACTATE 5 MG/ML IJ SOLN
0.5000 mg | Freq: Four times a day (QID) | INTRAMUSCULAR | Status: DC | PRN
  Filled 2020-06-16: qty 1

## 2020-06-16 MED ORDER — HYDROMORPHONE HCL 1 MG/ML IJ SOLN
0.5000 mg | INTRAMUSCULAR | Status: DC | PRN
  Administered 2020-06-16: 0.5 mg via INTRAVENOUS
  Filled 2020-06-16: qty 1

## 2020-06-16 MED ORDER — ACETAMINOPHEN 10 MG/ML IV SOLN
1000.0000 mg | Freq: Once | INTRAVENOUS | Status: AC
  Administered 2020-06-16: 1000 mg via INTRAVENOUS
  Filled 2020-06-16: qty 100

## 2020-06-16 NOTE — Progress Notes (Signed)
[name]  MRN: [id number]  DOB/AGE: [birth year] 59 y.o.  Primary Care Physician:[facility] [place] date: 06/02/2020  Chief Complaint:  Chief Complaint  Patient presents with  . Shortness of Breath    S-Pt presented on  06/02/2020 with  Chief Complaint  Patient presents with  . Shortness of Breath  .    Pt laying comfortable . Pt had abdominal pain earlier, recived pain mediaction now resting    Medications  . aspirin EC  81 mg Oral Daily  . atorvastatin  20 mg Oral Daily  . chlorhexidine gluconate (MEDLINE KIT)  15 mL Mouth Rinse BID  . Chlorhexidine Gluconate Cloth  6 each Topical Daily  . cholestyramine light  4 g Oral BID  . dextrose  1 ampule Intravenous Once  . epoetin (EPOGEN/PROCRIT) injection  10,000 Units Intravenous Q T,Th,Sa-HD  . feeding supplement (ENSURE ENLIVE)  237 mL Oral BID BM  . ferrous sulfate  325 mg Oral Daily  . gabapentin  100 mg Oral QPM  . heparin  5,000 Units Subcutaneous Q8H  . hydrOXYzine  25 mg Oral Daily  . lipase/protease/amylase  12,000 Units Oral TID WC  . mouth rinse  15 mL Mouth Rinse 10 times per day  . multivitamin  1 tablet Oral Daily  . pantoprazole (PROTONIX) IV  40 mg Intravenous Q24H  . cyanocobalamin  1,000 mcg Oral Daily         WPV:XYIAX from the symptoms mentioned above,there are no other symptoms referable to all systems reviewed.  Physical Exam: Vital signs in last 24 hours: Temp:  [97.9 F (36.6 C)-98.3 F (36.8 C)] 98 F (36.7 C) (09/25 0800) Pulse Rate:  [61-72] 72 (09/25 1500) Resp:  [15-26] 16 (09/25 1500) BP: (119-154)/(45-85) 140/68 (09/25 1500) SpO2:  [84 %-100 %] 100 % (09/25 1500) Weight change:  Last BM Date: 06/14/20  Intake/Output from previous day: 09/24 0701 - 09/25 0700 In: 2610.3 [I.V.:1990.3; Blood:370; IV Piggyback:250] Out: 0  Total I/O In: 33 [IV Piggyback:50] Out: -    Physical Exam: General- pt is lethargic but arousable  Resp- No acute REsp  distress, NO Rhonchi CVS- S1S2 regular in rate and rhythm GIT- BS+, soft, NT, ND EXT- NO LE Edema, Cyanosis Access patient has temporary catheter in the right femoral  Lab Results: CBC Recent Labs    06/15/20 0505 06/15/20 0505 06/15/20 1609 06/16/20 0545  WBC 26.8*  --   --  13.9*  HGB 6.4*   < > 7.4* 7.9*  HCT 18.6*   < > 22.1* 23.3*  PLT 180  --   --  181   < > = values in this interval not displayed.    BMET Recent Labs    06/15/20 0505 06/16/20 0545  NA 136 135  K 3.0* 3.1*  CL 96* 96*  CO2 29 28  GLUCOSE 85 113*  BUN 13 21  CREATININE 3.96* 4.84*  CALCIUM 7.0* 7.6*    MICRO Recent Results (from the past 240 hour(s))  CULTURE, BLOOD (ROUTINE X 2) w Reflex to ID Panel     Status: None   Collection Time: 06/07/20  7:02 PM   Specimen: BLOOD  Result Value Ref Range Status   Specimen Description BLOOD BLOOD LEFT FOREARM  Final   Special Requests   Final    BOTTLES DRAWN AEROBIC AND ANAEROBIC Blood Culture results may not be optimal due to an inadequate volume of blood received in culture bottles   Culture  Final    NO GROWTH 5 DAYS Performed at [facility], [address] [place], [place] [address]    Report Status 06/12/2020 FINAL  Final  CULTURE, BLOOD (ROUTINE X 2) w Reflex to ID Panel     Status: None   Collection Time: 06/07/20  8:46 PM   Specimen: BLOOD  Result Value Ref Range Status   Specimen Description BLOOD BLOOD LEFT HAND  Final   Special Requests   Final    BOTTLES DRAWN AEROBIC ONLY Blood Culture results may not be optimal due to an inadequate volume of blood received in culture bottles   Culture   Final    NO GROWTH 5 DAYS Performed at [facility], [address] [place], [place] [address]    Report Status 06/12/2020 FINAL  Final  MRSA PCR Screening     Status: None   Collection Time: 06/11/20 10:40 AM   Specimen: Nasopharyngeal  Result Value Ref Range Status   MRSA by PCR NEGATIVE NEGATIVE Final    Comment:         The GeneXpert MRSA Assay (FDA approved for NASAL specimens only), is one component of a comprehensive MRSA colonization surveillance program. It is not intended to diagnose MRSA infection nor to guide or monitor treatment for MRSA infections. Performed at [facility], [address] [place], [place] [address]   Aerobic/Anaerobic Culture (surgical/deep wound)     Status: None (Preliminary result)   Collection Time: 06/13/20  2:23 PM   Specimen: Wound; Abscess  Result Value Ref Range Status   Specimen Description   Final    WOUND Performed at [facility], [address], [place], [place] [address]    Special Requests   Final    RIGHT ARM PERI GRAFT ABSCESS Performed at [facility], [address] [place], [place] [address]    Gram Stain   Final    NO WBC SEEN NO ORGANISMS SEEN Performed at [facility], [place] [address], [place],  [address]    Culture   Final    RARE ENTEROCOCCUS FAECALIS SUSCEPTIBILITIES TO FOLLOW NO ANAEROBES ISOLATED; CULTURE IN PROGRESS FOR 5 DAYS    Report Status PENDING  Incomplete      Lab Results  Component Value Date   PTH 357 (H) 10/05/2018   CALCIUM 7.6 (L) 06/16/2020   CAION 1.09 (L) 04/13/2020   PHOS 4.3 06/16/2020               Impression:   [name] is a 59 y.o. black female with ESRD on HD MWF, hypertension, COPD, diastolic heart failure, depression, diabetes mellitus type 2, hyperlipidemia, pancreatic insufficiency with new dialysis access placement 04/12/2020 who is   1)Renal ESRD Patient is on hemodialysis as outpatient As an outpatient patient was on Monday Wednesday Friday schedule As inpatient patient is currently being dialyzed on Tuesday Thursday Saturday schedule Patient will be dialyzed today  2)HTN Blood pressure is at goal  3)Anemia of chronic disease  HGb is not at goal (9--11) Patient is on Epogen Patient also received PRBC yesterday  4)  secondary hyperparathyroidism -CKD Mineral-Bone Disorder   Secondary Hyperparathyroidism present   Phosphorus at goal.   5) enterococcal bacteremia Patient is currently on broad-spectrum antibiotics The source was thought to be AV graft Patient's AV graft has since placed removed on September 22   6) electrolytes   sodium Normonatremic   potassium Hypokalemia We will use higher K bath  7)Acid base Co2 at goal     Plan:   We will dialyze patient today We will use higher K bath We will keep patient on Epogen    [name] 06/16/2020, 5:34 PM

## 2020-06-16 NOTE — Progress Notes (Addendum)
CRITICAL CARE NOTE 59 y.o.femalewith medical history significant ofend-stage renal disease on hemodialysis Mondays Wednesdays and Fridays, hypertension, COPD, diastolic dysfunction CHF, depression, diabetes, hyperlipidemia, who apparently missed her dialysis on Friday.    9/20 TEE performed Tee revealed no evidence of endocartidits. Procedure complicated by hypotension and bradycaradia with heart rates dropping to mid 20's and blood pressure systolic to 62B. Received 1 amp of atropine, amp of epinephrine and neosynephrine. Pt intubated to protect air way. Stat head ct and chest ct were unremarkable. Pt son notified immediately and has been updated with care. 9/21 remains on vent, plan for SAT/SBT 9/22 plan for OR today  MRI BRAIN-IMPRESSION: 1. No acute intracranial abnormality. 2. Few small remote lacunar infarcts involving the pons. 3. Otherwise normal brain MRI for age. 9/23 HD performed patient extubated after successful SAT/SBT 9/24 uneventful day, still somewhat encephalopathic but clearing  CC  follow up respiratory failure  HPI Patient extubated 9/23 without sequela  Still somewhat confused/encephalopathic Mentation improving day to day HD yesterday   BP 136/60   Pulse 64   Temp 97.9 F (36.6 C) (Oral)   Resp 19   Ht 5' 5"  (1.651 m)   Wt 81.4 kg   SpO2 100%   BMI 29.86 kg/m    I/O last 3 completed shifts: In: 7628 [I.V.:1135; Blood:370; IV Piggyback:250] Out: 0  No intake/output data recorded.  SpO2: 100 % O2 Flow Rate (L/min): 2 L/min FiO2 (%): 21 %  Estimated body mass index is 29.86 kg/m as calculated from the following:   Height as of this encounter: 5' 5"  (1.651 m).   Weight as of this encounter: 81.4 kg.  REVIEW OF SYSTEMS Difficult to obtain due to mild residual encephalopathy   PHYSICAL EXAMINATION:  GENERAL: Chronically ill-appearing woman, comfortable on nasal cannula O2, confused but easily redirected. HEAD: Normocephalic, atraumatic.   EYES: Pupils equal, round, reactive to light.  No scleral icterus.  MOUTH: Edentulous, oral mucosa moist. NECK: Supple.  Trachea midline.  No JVD. PULMONARY: Coarse breath sounds throughout, no wheezes or rhonchi noted. CARDIOVASCULAR: S1 and S2. Regular rate and rhythm. No murmurs, rubs, or gallops.  GASTROINTESTINAL: Soft, nontender,non-distended.  Positive bowel sounds.  Tolerating p.o.'s. MUSCULOSKELETAL: No swelling, clubbing, +1 edema.  NEUROLOGIC: Alert, awake, still slightly befuddled at times impulsive. SKIN:intact,warm,dry  Scheduled Meds: . aspirin EC  81 mg Oral Daily  . atorvastatin  20 mg Oral Daily  . chlorhexidine gluconate (MEDLINE KIT)  15 mL Mouth Rinse BID  . Chlorhexidine Gluconate Cloth  6 each Topical Daily  . cholestyramine light  4 g Oral BID  . dextrose  1 ampule Intravenous Once  . epoetin (EPOGEN/PROCRIT) injection  10,000 Units Intravenous Q T,Th,Sa-HD  . feeding supplement (ENSURE ENLIVE)  237 mL Oral BID BM  . ferrous sulfate  325 mg Oral Daily  . gabapentin  100 mg Oral QPM  . heparin  5,000 Units Subcutaneous Q8H  . hydrOXYzine  25 mg Oral Daily  . lipase/protease/amylase  12,000 Units Oral TID WC  . mouth rinse  15 mL Mouth Rinse 10 times per day  . multivitamin  1 tablet Oral Daily  . cyanocobalamin  1,000 mcg Oral Daily   Continuous Infusions: . ampicillin (OMNIPEN) IV 2 g (06/15/20 2206)  . dextrose 60 mL/hr at 06/15/20 1546  . famotidine (PEPCID) IV Stopped (06/15/20 1206)  . norepinephrine (LEVOPHED) Adult infusion Stopped (06/15/20 0959)   PRN Meds:.acetaminophen **OR** acetaminophen, calcium carbonate, camphor-menthol **AND** hydrOXYzine, fentaNYL (SUBLIMAZE) injection, hydrALAZINE, ipratropium-albuterol, loperamide,  naLOXone (NARCAN)  injection, ondansetron **OR** ondansetron (ZOFRAN) IV, oxyCODONE-acetaminophen, promethazine, sorbitol, zolpidem    CULTURE RESULTS   Recent Results (from the past 240 hour(s))  CULTURE, BLOOD (ROUTINE  X 2) w Reflex to ID Panel     Status: None   Collection Time: 06/07/20  7:02 PM   Specimen: BLOOD  Result Value Ref Range Status   Specimen Description BLOOD BLOOD LEFT FOREARM  Final   Special Requests   Final    BOTTLES DRAWN AEROBIC AND ANAEROBIC Blood Culture results may not be optimal due to an inadequate volume of blood received in culture bottles   Culture   Final    NO GROWTH 5 DAYS Performed at [facility], [address] [place], [place] [address]    Report Status 06/12/2020 FINAL  Final  CULTURE, BLOOD (ROUTINE X 2) w Reflex to ID Panel     Status: None   Collection Time: 06/07/20  8:46 PM   Specimen: BLOOD  Result Value Ref Range Status   Specimen Description BLOOD BLOOD LEFT HAND  Final   Special Requests   Final    BOTTLES DRAWN AEROBIC ONLY Blood Culture results may not be optimal due to an inadequate volume of blood received in culture bottles   Culture   Final    NO GROWTH 5 DAYS Performed at [facility], [address] [place], [place] [address]    Report Status 06/12/2020 FINAL  Final  MRSA PCR Screening     Status: None   Collection Time: 06/11/20 10:40 AM   Specimen: Nasopharyngeal  Result Value Ref Range Status   MRSA by PCR NEGATIVE NEGATIVE Final    Comment:        The GeneXpert MRSA Assay (FDA approved for NASAL specimens only), is one component of a comprehensive MRSA colonization surveillance program. It is not intended to diagnose MRSA infection nor to guide or monitor treatment for MRSA infections. Performed at [facility], [address] [place], [place] [address]   Aerobic/Anaerobic Culture (surgical/deep wound)     Status: None (Preliminary result)   Collection Time: 06/13/20  2:23 PM   Specimen: Wound; Abscess  Result Value Ref Range Status   Specimen Description   Final    WOUND Performed at [facility], [address], [place], [place] [address]    Special Requests   Final    RIGHT ARM  PERI GRAFT ABSCESS Performed at [facility], [address] [place], [place] [address]    Gram Stain   Final    NO WBC SEEN NO ORGANISMS SEEN Performed at [facility], [place] [address] [place], [place] [address]    Culture   Final    RARE ENTEROCOCCUS FAECALIS CULTURE REINCUBATED FOR BETTER GROWTH NO ANAEROBES ISOLATED; CULTURE IN PROGRESS FOR 5 DAYS    Report Status PENDING  Incomplete         Indwelling Urinary Catheter N/A  Reason to continue Indwelling Urinary Catheter     Central Line/   Reason to continue Central Line  tunneled right hemodialysis catheter    Ventilator N/A   Ventilator Sedation N/A      ASSESSMENT AND PLAN SYNOPSIS  59 yo  female with multiple medical issues with enterococcus bacteremia s/p TEE with post op complication with near cardiac arrest with acute severe resp failure  ACUTE Hypoxic Respiratory Failuredue to acute hypotension and near cardiac arrest due to sedative medications with underlying multiple medical conditions  with severe sepsis and bacteremia  Resolved ACUTE Hypoxic  Respiratory Failure Hypotension Extubated 9/23 after dialysis Comfortable on room air Weaned off pressors Dialysis today  ACUTE DIASTOLIC CARDIAC FAILURE -follow up cardiac enzymes as indicated -follow up cardiology recs -Volume management with HD  ESRD on dialysis Had dialysis 9/23 Per renal  Anemia of chronic renal disease Aggravated by acute blood loss (AV graft revision) Received 2 units PRBCs between 9/23 and 9/24 On EPO Continue to monitor Hematoma on AV graft site No evidence of ongoing bleeding Anemia at baseline  Acute encephalopathy Toxic/metabolic Improving off sedatives Continue to redirect PT/OT in a.m.  ENTEROCOCCUS BACTEREMIA Source appears to have been infected AV hero graft Removal of infected right arm hero AV graft 9/22 Underwent endarterectomy of the right brachial artery 9/22 Continue antibiotics  per ID Discussed with Dr. Delaine Lame 9/24  PROPHYLAXIS GI: H2 blocker VTE: Heparin subcu  DIET Taking p.o.'s well  ENDO ICU hyperglycemia protocol as needed   Patient is stable for transfer out of stepdown/ICU.  She is still somewhat encephalopathic but improving every day.  Patient will be transferred to [facility] hospitalist service.  Renold Don, MD [place] [facility]   *This note was dictated using voice recognition software/Dragon.  Despite best efforts to proofread, errors can occur which can change the meaning.  Any change was purely unintentional.

## 2020-06-16 NOTE — Progress Notes (Signed)
CT abdomen and pelvis did not show any retroperitoneal hemorrhage.  Basically just evidence of volume overload/anasarca.  Otherwise no abnormalities.  She does have diverticulosis.  She does complain bitterly of abdominal pain, son is present and states that this is not unusual for her she has difficulties with gastroparesis and tendency towards constipation and states that she presents very frequently to the emergency room for abdominal pain.  Currently exam is benign.  We will continue to monitor.  She may transfer to progressive cardiac care.  She will have dialysis today.  Renold Don, MD [place] [facility]   *This note was dictated using voice recognition software/Dragon.  Despite best efforts to proofread, errors can occur which can change the meaning.  Any change was purely unintentional.

## 2020-06-16 NOTE — Progress Notes (Signed)
Called report to Progressive care unit, patient will be transferred to that floor after dialysis.

## 2020-06-16 NOTE — Progress Notes (Signed)
Chart reviewed attempted to see patient for trails of solids. Pt was in pain, Nsg in the room reported Pt is going for a stat ultrasound and then dialysis. Briefly spoke with patient who is eager to try solids. She reported she will need some denture adhesive but can ask family to bring for future ST visit. Reattempt at a later date as Pt is able to participate. Continue with Dys 1 diet for now.

## 2020-06-16 NOTE — Progress Notes (Signed)
Patient transferred to dialysis right as I got here, have not been able to do full assessment yet. Patient was awake and alert at time of transfer. No obvious signs of distress.

## 2020-06-16 NOTE — Progress Notes (Signed)
SUBJECTIVE: Patient appears to be comfortable denies any chest pain but has some shortness of breath and generalized body aches   Vitals:   06/16/20 0600 06/16/20 0800 06/16/20 0900 06/16/20 1000  BP: 136/60 (!) 154/62 (!) 153/64 138/64  Pulse: 64 65 61 63  Resp: 19 (!) 23 (!) 21 18  Temp:  98 F (36.7 C)    TempSrc:  Oral    SpO2: 100% 100% 100% 98%  Weight:      Height:        Intake/Output Summary (Last 24 hours) at 06/16/2020 1416 Last data filed at 06/16/2020 1011 Gross per 24 hour  Intake 1007.12 ml  Output 0 ml  Net 1007.12 ml    LABS: Basic Metabolic Panel: Recent Labs    06/15/20 0505 06/16/20 0545  NA 136 135  K 3.0* 3.1*  CL 96* 96*  CO2 29 28  GLUCOSE 85 113*  BUN 13 21  CREATININE 3.96* 4.84*  CALCIUM 7.0* 7.6*  MG 1.5* 1.7  PHOS 3.2 4.3   Liver Function Tests: Recent Labs    06/15/20 0505 06/16/20 0545  ALBUMIN 2.0* 2.2*   No results for input(s): LIPASE, AMYLASE in the last 72 hours. CBC: Recent Labs    06/15/20 0505 06/15/20 0505 06/15/20 1609 06/16/20 0545  WBC 26.8*  --   --  13.9*  HGB 6.4*   < > 7.4* 7.9*  HCT 18.6*   < > 22.1* 23.3*  MCV 84.9  --   --  87.6  PLT 180  --   --  181   < > = values in this interval not displayed.   Cardiac Enzymes: No results for input(s): CKTOTAL, CKMB, CKMBINDEX, TROPONINI in the last 72 hours. BNP: Invalid input(s): POCBNP D-Dimer: No results for input(s): DDIMER in the last 72 hours. Hemoglobin A1C: No results for input(s): HGBA1C in the last 72 hours. Fasting Lipid Panel: Recent Labs    06/14/20 0726  TRIG 120   Thyroid Function Tests: No results for input(s): TSH, T4TOTAL, T3FREE, THYROIDAB in the last 72 hours.  Invalid input(s): FREET3 Anemia Panel: No results for input(s): VITAMINB12, FOLATE, FERRITIN, TIBC, IRON, RETICCTPCT in the last 72 hours.   PHYSICAL EXAM General: Well developed, well nourished, in no acute distress HEENT:  Normocephalic and atramatic Neck:  No JVD.   Lungs: Clear bilaterally to auscultation and percussion. Heart: HRRR . Normal S1 and S2 without gallops or murmurs.  Abdomen: Bowel sounds are positive, abdomen soft and non-tender  Msk:  Back normal, normal gait. Normal strength and tone for age. Extremities: No clubbing, cyanosis or edema.   Neuro: Alert and oriented X 3. Psych:  Good affect, responds appropriately  TELEMETRY: Sinus rhythm  ASSESSMENT AND PLAN: Bacteremia due to Enterococcus but no obvious vegetation seen on transesophageal echocardiogram.  Patient was intubated but is now extubated and appears comfortable.  Principal Problem:   Bacteremia due to Enterococcus Active Problems:   Acute respiratory failure with hypoxia (HCC)   Anemia of chronic disease   Acute diastolic CHF (congestive heart failure) (HCC)   COPD (chronic obstructive pulmonary disease) (HCC)   ESRD on hemodialysis (HCC)   Benign essential HTN   Obesity (BMI 30-39.9)    KHAN,SHAUKAT A, MD, [facility] 06/16/2020 2:16 PM

## 2020-06-16 NOTE — Care Management (Signed)
This is a no charge note  Pick up from PCCM, Dr. Patsey Berthold  59 year old lady with past medical history for end-stage renal disease on dialysis (MWF), dCHF, anemia, CAD, renal cell cancer, pancreatitis, anemia, hypertension, diabetes mellitus, COPD, asthma, depression, anxiety, who was admitted to ICU due to Enterococcus bacteremia secondary to AV graft infection.  Patient had near cardiac arrest and respiratory failure.  Patient was extubated on 9/23.  TEE negative for endocarditis.  Patient is currently on ampicillin.  Renal and infectious disease on board. We need to pick up this patient tomorrow morning.   Ivor Costa, MD  [facility]   If 7PM-7AM, please contact night-coverage [email or web address] 06/16/2020, 9:48 AM

## 2020-06-16 NOTE — Significant Event (Signed)
Patient started complaining of abdominal pain prior to going to dialysis.  Examination shows somewhat indurated right femoral area where temporary dialysis catheter is placed, abdomen is diffusely tender without rebound, will need to get abdomen pelvis CT, questionable retroperitoneal bleed.  The need to hold transfer to hospitalist service pending findings but will discuss with Dr. Blaine Hamper when findings are known.  Robin Don, MD [place] [facility]

## 2020-06-17 DIAGNOSIS — D638 Anemia in other chronic diseases classified elsewhere: Secondary | ICD-10-CM

## 2020-06-17 LAB — TYPE AND SCREEN
ABO/RH(D): O POS
Antibody Screen: NEGATIVE
Unit division: 0
Unit division: 0
Unit division: 0

## 2020-06-17 LAB — GLUCOSE, CAPILLARY
Glucose-Capillary: 102 mg/dL — ABNORMAL HIGH (ref 70–99)
Glucose-Capillary: 92 mg/dL (ref 70–99)
Glucose-Capillary: 80 mg/dL (ref 70–99)
Glucose-Capillary: 135 mg/dL — ABNORMAL HIGH (ref 70–99)
Glucose-Capillary: 148 mg/dL — ABNORMAL HIGH (ref 70–99)

## 2020-06-17 LAB — BPAM RBC
Blood Product Expiration Date: 202110232359
Blood Product Expiration Date: 202110232359
Blood Product Expiration Date: 202110262359
ISSUE DATE / TIME: 202109241049
ISSUE DATE / TIME: 202109242310
Unit Type and Rh: 5100
Unit Type and Rh: 5100
Unit Type and Rh: 5100

## 2020-06-17 LAB — POTASSIUM: Potassium: 3.8 mmol/L (ref 3.5–5.1)

## 2020-06-17 LAB — RENAL FUNCTION PANEL
Albumin: 2.3 g/dL — ABNORMAL LOW (ref 3.5–5.0)
Anion gap: 11 (ref 5–15)
BUN: 13 mg/dL (ref 8–23)
CO2: 27 mmol/L (ref 22–32)
Calcium: 8 mg/dL — ABNORMAL LOW (ref 8.9–10.3)
Chloride: 98 mmol/L (ref 98–111)
Creatinine, Ser: 3.4 mg/dL — ABNORMAL HIGH (ref 0.44–1.00)
GFR calc Af Amer: 16 mL/min — ABNORMAL LOW (ref >60)
GFR calc non Af Amer: 14 mL/min — ABNORMAL LOW (ref >60)
Glucose, Bld: 105 mg/dL — ABNORMAL HIGH (ref 70–99)
Phosphorus: 2.8 mg/dL (ref 2.5–4.6)
Potassium: 2.9 mmol/L — ABNORMAL LOW (ref 3.5–5.1)
Sodium: 136 mmol/L (ref 135–145)

## 2020-06-17 LAB — CBC
HCT: 21.9 % — ABNORMAL LOW (ref 36.0–46.0)
Hemoglobin: 7.4 g/dL — ABNORMAL LOW (ref 12.0–15.0)
MCH: 29.1 pg (ref 26.0–34.0)
MCHC: 33.8 g/dL (ref 30.0–36.0)
MCV: 86.2 fL (ref 80.0–100.0)
Platelets: 190 10*3/uL (ref 150–400)
RBC: 2.54 MIL/uL — ABNORMAL LOW (ref 3.87–5.11)
RDW: 16.7 % — ABNORMAL HIGH (ref 11.5–15.5)
WBC: 7.3 10*3/uL (ref 4.0–10.5)
nRBC: 0 % (ref 0.0–0.2)

## 2020-06-17 LAB — PREPARE RBC (CROSSMATCH)

## 2020-06-17 LAB — MAGNESIUM: Magnesium: 1.8 mg/dL (ref 1.7–2.4)

## 2020-06-17 MED ORDER — POTASSIUM CHLORIDE 20 MEQ PO PACK
40.0000 meq | PACK | Freq: Once | ORAL | Status: AC
  Administered 2020-06-17: 40 meq via ORAL
  Filled 2020-06-17: qty 2

## 2020-06-17 MED ORDER — SODIUM CHLORIDE 0.9 % IV SOLN
INTRAVENOUS | Status: DC | PRN
  Administered 2020-06-17: 250 mL via INTRAVENOUS
  Administered 2020-06-21: 200 mL via INTRAVENOUS

## 2020-06-17 MED ORDER — POTASSIUM CHLORIDE 20 MEQ PO PACK: 40.0000 meq | PACK | Freq: Once | ORAL | Status: DC

## 2020-06-17 NOTE — Progress Notes (Signed)
SUBJECTIVE: Patient is sleeping comfortably   Vitals:   06/17/20 0745 06/17/20 0750 06/17/20 0755 06/17/20 0800  BP:    (!) 134/57  Pulse: 71 69 67 67  Resp: (!) 21 17 15 15   Temp:      TempSrc:      SpO2: 93% 99% 98% 99%  Weight:      Height:        Intake/Output Summary (Last 24 hours) at 06/17/2020 1023 Last data filed at 06/17/2020 0328 Gross per 24 hour  Intake 229.32 ml  Output 1100 ml  Net -870.68 ml    LABS: Basic Metabolic Panel: Recent Labs    06/16/20 0545 06/17/20 0620  NA 135 136  K 3.1* 2.9*  CL 96* 98  CO2 28 27  GLUCOSE 113* 105*  BUN 21 13  CREATININE 4.84* 3.40*  CALCIUM 7.6* 8.0*  MG 1.7 1.8  PHOS 4.3 2.8   Liver Function Tests: Recent Labs    06/16/20 0545 06/17/20 0620  ALBUMIN 2.2* 2.3*   No results for input(s): LIPASE, AMYLASE in the last 72 hours. CBC: Recent Labs    06/16/20 0545 06/17/20 0628  WBC 13.9* 7.3  HGB 7.9* 7.4*  HCT 23.3* 21.9*  MCV 87.6 86.2  PLT 181 190   Cardiac Enzymes: No results for input(s): CKTOTAL, CKMB, CKMBINDEX, TROPONINI in the last 72 hours. BNP: Invalid input(s): POCBNP D-Dimer: No results for input(s): DDIMER in the last 72 hours. Hemoglobin A1C: No results for input(s): HGBA1C in the last 72 hours. Fasting Lipid Panel: No results for input(s): CHOL, HDL, LDLCALC, TRIG, CHOLHDL, LDLDIRECT in the last 72 hours. Thyroid Function Tests: No results for input(s): TSH, T4TOTAL, T3FREE, THYROIDAB in the last 72 hours.  Invalid input(s): FREET3 Anemia Panel: No results for input(s): VITAMINB12, FOLATE, FERRITIN, TIBC, IRON, RETICCTPCT in the last 72 hours.   PHYSICAL EXAM General: Well developed, well nourished, in no acute distress HEENT:  Normocephalic and atramatic Neck:  No JVD.  Lungs: Clear bilaterally to auscultation and percussion. Heart: HRRR . Normal S1 and S2 without gallops or murmurs.  Abdomen: Bowel sounds are positive, abdomen soft and non-tender  Msk:  Back normal, normal  gait. Normal strength and tone for age. Extremities: No clubbing, cyanosis or edema.   Neuro: Alert and oriented X 3. Psych:  Good affect, responds appropriately  TELEMETRY: Sinus rhythm ASSESSMENT AND PLAN: Enterococcus bacteremia and respiratory failure with no evidence of endocarditis and encephalopathy.  Patient is comfortable and was transferred from ICU to telemetry.  Continue treatment for heart failure with preserved ejection fraction.  Principal Problem:   Bacteremia due to Enterococcus Active Problems:   Acute respiratory failure with hypoxia (HCC)   Anemia of chronic disease   Acute diastolic CHF (congestive heart failure) (HCC)   COPD (chronic obstructive pulmonary disease) (HCC)   ESRD on hemodialysis (HCC)   Benign essential HTN   Obesity (BMI 30-39.9)    Mali Eppard A, MD, [facility] 06/17/2020 10:23 AM

## 2020-06-17 NOTE — Progress Notes (Addendum)
LORRIE GARGAN  MRN: [id number]  DOB/AGE: 61-Feb-1962 61 y.o.  Primary Care Physician:[facility] [place] date: 06/02/2020  Chief Complaint:  Chief Complaint  Patient presents with  . Shortness of Breath    S-Pt presented on  06/02/2020 with  Chief Complaint  Patient presents with  . Shortness of Breath  . Pt offers no new complaints.  Patient main complaint in today visit was "if she could out of the bed?" I then eduacted patient and I will discuss this with hospitalist/primary team. Patient was no other specific concerns No complaint of chest pain/shortness of breath    Medications  . aspirin EC  81 mg Oral Daily  . atorvastatin  20 mg Oral Daily  . chlorhexidine gluconate (MEDLINE KIT)  15 mL Mouth Rinse BID  . Chlorhexidine Gluconate Cloth  6 each Topical Daily  . cholestyramine light  4 g Oral BID  . dextrose  1 ampule Intravenous Once  . epoetin (EPOGEN/PROCRIT) injection  10,000 Units Intravenous Q T,Th,Sa-HD  . feeding supplement (ENSURE ENLIVE)  237 mL Oral BID BM  . ferrous sulfate  325 mg Oral Daily  . gabapentin  100 mg Oral QPM  . heparin  5,000 Units Subcutaneous Q8H  . hydrOXYzine  25 mg Oral Daily  . lipase/protease/amylase  12,000 Units Oral TID WC  . multivitamin  1 tablet Oral Daily  . pantoprazole (PROTONIX) IV  40 mg Intravenous Q24H  . cyanocobalamin  1,000 mcg Oral Daily         PPI:RJJOA from the symptoms mentioned above,there are no other symptoms referable to all systems reviewed.  Physical Exam: Vital signs in last 24 hours: Temp:  [97.7 F (36.5 C)-98.8 F (37.1 C)] 98.3 F (36.8 C) (09/26 0735) Pulse Rate:  [62-72] 67 (09/26 0800) Resp:  [12-37] 15 (09/26 0800) BP: (118-173)/(48-97) 134/57 (09/26 0800) SpO2:  [84 %-100 %] 99 % (09/26 0800) Weight change:  Last BM Date: 06/17/20  Intake/Output from previous day: 09/25 0701 - 09/26 0700 In: 279.3 [I.V.:129.3; IV Piggyback:150] Out: 1100 [Stool:1100] No  intake/output data recorded.   Physical Exam: General- pt is lethargic but arousable  Resp- No acute REsp distress, NO Rhonchi CVS- S1S2 regular in rate and rhythm GIT- BS+, soft, NT, ND EXT- NO LE Edema, Cyanosis Access patient has temporary catheter in the right femoral  Lab Results: CBC Recent Labs    06/16/20 0545 06/17/20 0628  WBC 13.9* 7.3  HGB 7.9* 7.4*  HCT 23.3* 21.9*  PLT 181 190    BMET Recent Labs    06/16/20 0545 06/17/20 0620  NA 135 136  K 3.1* 2.9*  CL 96* 98  CO2 28 27  GLUCOSE 113* 105*  BUN 21 13  CREATININE 4.84* 3.40*  CALCIUM 7.6* 8.0*    MICRO Recent Results (from the past 240 hour(s))  CULTURE, BLOOD (ROUTINE X 2) w Reflex to ID Panel     Status: None   Collection Time: 06/07/20  7:02 PM   Specimen: BLOOD  Result Value Ref Range Status   Specimen Description BLOOD BLOOD LEFT FOREARM  Final   Special Requests   Final    BOTTLES DRAWN AEROBIC AND ANAEROBIC Blood Culture results may not be optimal due to an inadequate volume of blood received in culture bottles   Culture   Final    NO GROWTH 5 DAYS Performed at [facility], [address], [place], [place] [address]    Report Status 06/12/2020 FINAL  Final  CULTURE, BLOOD (ROUTINE X 2) w Reflex to ID Panel     Status: None   Collection Time: 06/07/20  8:46 PM   Specimen: BLOOD  Result Value Ref Range Status   Specimen Description BLOOD BLOOD LEFT HAND  Final   Special Requests   Final    BOTTLES DRAWN AEROBIC ONLY Blood Culture results may not be optimal due to an inadequate volume of blood received in culture bottles   Culture   Final    NO GROWTH 5 DAYS Performed at [facility], [address] [place], [place] [address]    Report Status 06/12/2020 FINAL  Final  MRSA PCR Screening     Status: None   Collection Time: 06/11/20 10:40 AM   Specimen: Nasopharyngeal  Result Value Ref Range Status   MRSA by PCR NEGATIVE NEGATIVE Final    Comment:        The  GeneXpert MRSA Assay (FDA approved for NASAL specimens only), is one component of a comprehensive MRSA colonization surveillance program. It is not intended to diagnose MRSA infection nor to guide or monitor treatment for MRSA infections. Performed at [facility], [address] [place], [place] [address]   Aerobic/Anaerobic Culture (surgical/deep wound)     Status: None (Preliminary result)   Collection Time: 06/13/20  2:23 PM   Specimen: Wound; Abscess  Result Value Ref Range Status   Specimen Description   Final    WOUND Performed at [facility], [address] [place], [place] [address]    Special Requests   Final    RIGHT ARM PERI GRAFT ABSCESS Performed at [facility], [address] [place], [place] [address]    Gram Stain   Final    NO WBC SEEN NO ORGANISMS SEEN Performed at [facility], [place] [address], [place], [place] [address]    Culture   Final    RARE ENTEROCOCCUS FAECALIS SUSCEPTIBILITIES TO FOLLOW NO ANAEROBES ISOLATED; CULTURE IN PROGRESS FOR 5 DAYS    Report Status PENDING  Incomplete      Lab Results  Component Value Date   PTH 357 (H) 10/05/2018   CALCIUM 8.0 (L) 06/17/2020   CAION 1.09 (L) 04/13/2020   PHOS 2.8 06/17/2020               Impression:   Ms. DISAYA WALT is a 59 y.o. black female with ESRD on HD MWF, hypertension, COPD, diastolic heart failure, depression, diabetes mellitus type 2, hyperlipidemia, pancreatic insufficiency with new dialysis access placement 04/12/2020 who is   1)Renal ESRD Patient is on hemodialysis as outpatient As an outpatient patient was on Monday Wednesday Friday schedule As inpatient patient is currently being dialyzed on Tuesday Thursday Saturday schedule Patient was dialyzed yesterday  2)HTN Blood pressure is at goal  3)Anemia of chronic disease  HGb is not at goal (9--11) Patient is on Epogen Patient also received PRBC yesterday  4) secondary  hyperparathyroidism -CKD Mineral-Bone Disorder   Secondary Hyperparathyroidism present   Phosphorus at goal.   5) enterococcal bacteremia Patient is currently on broad-spectrum antibiotics The source was thought to be AV graft Patient's AV graft has since placed removed on September 22   6) electrolytes   sodium Normonatremic   potassium Hypokalemia We will replete   7)Acid base Co2 at goal     Plan:   Will continue current care     s [facility] 06/17/2020, 11:23 AM

## 2020-06-17 NOTE — Progress Notes (Signed)
PROGRESS NOTE    Robin VERONICA   UYQ:[id number]  DOB: 11-17-1960  PCP: [facility]    DOA: 06/02/2020 LOS: 72   Brief Narrative   59 year old female with past medical history of ESRD on dialysis (MWF), dCHF, anemia, CAD, renal cell cancer, pancreatitis, anemia, hypertension, diabetes mellitus, COPD, asthma, depression, anxiety, who was admitted on 06/02/2020 with fever/chills, shortness of breath.  Admitted with pneumonia and volume overloaded after missing dialysis.  BP uncontrolled on presentation with systolic BP in 638'V.  She underwent dialysis the evening of admission.   Patient apparently refused blood draw for initial blood cultures, these were eventually obtained on 9/13 and returned positive for Enterococcus faecalis which is thought secondary to AV graft infection with abscess.  ID was consulted.   A temporary R femoral dialysis catheter was placed on 9/18.  TEE done on 9/20 was negative for endocarditis.  During the procedure, patient became hypotensive (systolic 56'E) and bradycardic (HR 20's), apparently was a near cardiac arrest, and was in acute severe respiratory failure.  Intubated and transferred to ICU, required vasopressors.   9/22 Vascular surgery removed the infected RUE AV graft and performed endarterectomy of the R brachial artery with Cormatrix patch.  Required 2 units pRBC transfusion due to blood loss.  9/23 patient successfully extubated.  Remained confused, but encephalopathy improving.  9/25 severe abdominal pain, concern for retroperitoneal hemorrhage which was ruled out by CT abdomen/pelvis.  Patient transferred out of ICU later on 9/25 to [facility] and [facility] assumed care of patient on 9/26.    Assessment & Plan   Principal Problem:   Bacteremia due to Enterococcus Active Problems:   Acute respiratory failure with hypoxia (HCC)   Anemia of chronic disease   Acute diastolic CHF (congestive heart failure) (HCC)   COPD (chronic  obstructive pulmonary disease) (HCC)   ESRD on hemodialysis (HCC)   Benign essential HTN   Obesity (BMI 30-39.9)    Severe sepsis secondary to Enterococcus faecalis bacteremia  Source appears to have been infected AV hero graft which was removed 9/22. ID is following.  Was on Ampicillin + Gentamicin, currently on Ampicillin monotherapy. Repeat blood cultures are negative to date - follow.   Acute Hypoxic  Respiratory Failure - Resolved - extubated 9/23, on room air Hypotension off vasopressors   Acute on chronic diastolic CHF / Volume Overload - volume being managed with dialysis and has improved.  Now appears euvolemic.     ESRD on dialysis - M/W/F dialysis.  Nephrology following.   Acute blood loss anemia superimposed on anemia of chronic renal disease - pt required blood transfusions after AV graft revision, s/p 2 units pRBCs 9/23 and 9/24.  Monitor CBC.  On Epo.  Monitor for bleeding.  Hbg currently at/near baseline.   Acute toxic/metabolic encephalopathy - appears was mostly due to sedating medications.  Mentation improving. --Delirium precautions:     -Lights and TV off, minimize interruptions at night    -Blinds open and lights on during day    -Glasses/hearing aid with patient    -Frequent reorientation    -PT/OT when able    -Avoid sedation medications/Beers list medications  Generalized weakness - PT and OT evaluations after ICU are pending   Obesity: Body mass index is 29.86 kg/m.  Complicates overall care and prognosis.     DVT prophylaxis: heparin injection 5,000 Units Start: 06/02/20 2300   Diet:  Diet Orders (From admission, onward)    Start  Ordered   06/17/20 1346  DIET DYS 2 Room service appropriate? Yes; Fluid consistency: Thin  Diet effective now       Question Answer Comment  Room service appropriate? Yes   Fluid consistency: Thin      06/17/20 1345            Code Status: Full Code    Subjective 06/17/20    Patient seen on  rounds this AM with nurse at bedside. She is angry about being stuck in bed and wants to go home.  Says she just wants out of here.  Gets upset talking about son not caring about her.  She does not engage in conversation but perseverates on going home and wanting to get out of bed.   Disposition Plan & Communication   Status is: Inpatient  Remains inpatient appropriate because:Inpatient level of care appropriate due to severity of illness.  Just out of ICU, needs therapy evaluations, repeat swallow evaluation.    Dispo: The patient is from: Home              Anticipated d/c is to: Home              Anticipated d/c date is: 2 days              Patient currently is not medically stable to d/c.    Family Communication: none at bedside, will attempt to call.    Consults, Procedures, Significant Events   Consultants:   Nephrology  PCCM  Vascular surgery  Cardiology  Procedures:   9/18 temporary femoral dialysis cath placed  9/20 TEE, intubated  9/22 removal of AV graft & R brachial endarterectomy  Dialysis  Antimicrobials:  Anti-infectives (From admission, onward)   Start     Dose/Rate Route Frequency Ordered Stop   06/16/20 1200  gentamicin (GARAMYCIN) IVPB 80 mg  Status:  Discontinued        80 mg 100 mL/hr over 30 Minutes Intravenous Every T-Th-Sa (Hemodialysis) 06/15/20 0803 06/15/20 1350   06/15/20 1200  gentamicin (GARAMYCIN) IVPB 80 mg  Status:  Discontinued        80 mg 100 mL/hr over 30 Minutes Intravenous Every M-W-F (Hemodialysis) 06/13/20 1404 06/15/20 0803   06/14/20 1800  gentamicin (GARAMYCIN) 60 mg in dextrose 5 % 50 mL IVPB        60 mg 103 mL/hr over 30 Minutes Intravenous  Once 06/14/20 1643 06/14/20 1819   06/13/20 1800  gentamicin (GARAMYCIN) IVPB 80 mg  Status:  Discontinued        80 mg 100 mL/hr over 30 Minutes Intravenous Every M-W-F (Hemodialysis) 06/12/20 1402 06/13/20 1404   06/13/20 0600  vancomycin (VANCOCIN) IVPB 1000 mg/200 mL premix         1,000 mg 200 mL/hr over 60 Minutes Intravenous On call to O.R. 06/13/20 0114 06/13/20 1350   06/12/20 1600  gentamicin (GARAMYCIN) IVPB 60 mg  Status:  Discontinued        60 mg 100 mL/hr over 30 Minutes Intravenous  Once 06/12/20 1402 06/12/20 1412   06/12/20 1600  gentamicin (GARAMYCIN) 60 mg in dextrose 5 % 50 mL IVPB        60 mg 103 mL/hr over 30 Minutes Intravenous  Once 06/12/20 1412 06/12/20 2105   06/11/20 1200  gentamicin (GARAMYCIN) IVPB 80 mg  Status:  Discontinued        80 mg 100 mL/hr over 30 Minutes Intravenous Every M-W-F (Hemodialysis) 06/08/20 1356 06/12/20 1402  06/10/20 1900  gentamicin (GARAMYCIN) IVPB 80 mg        80 mg 100 mL/hr over 30 Minutes Intravenous  Once 06/10/20 1722 06/11/20 0056   06/08/20 1800  gentamicin (GARAMYCIN) IVPB 100 mg  Status:  Discontinued        100 mg 200 mL/hr over 30 Minutes Intravenous  Once 06/08/20 1356 06/08/20 1425   06/08/20 1800  gentamicin (GARAMYCIN) IVPB 100 mg  Status:  Discontinued        100 mg 200 mL/hr over 30 Minutes Intravenous  Once 06/08/20 1425 06/08/20 1430   06/08/20 1800  gentamicin (GARAMYCIN) IVPB 100 mg  Status:  Discontinued        100 mg 200 mL/hr over 30 Minutes Intravenous  Once 06/08/20 1430 06/08/20 1441   06/08/20 1800  gentamicin (GARAMYCIN) 100 mg in dextrose 5 % 50 mL IVPB        100 mg 105 mL/hr over 30 Minutes Intravenous  Once 06/08/20 1441 06/08/20 1927   06/08/20 1500  ampicillin (OMNIPEN) 2 g in sodium chloride 0.9 % 100 mL IVPB        2 g 300 mL/hr over 20 Minutes Intravenous Every 12 hours 06/08/20 1356     06/07/20 2200  piperacillin-tazobactam (ZOSYN) IVPB 2.25 g  Status:  Discontinued        2.25 g 100 mL/hr over 30 Minutes Intravenous Every 8 hours 06/07/20 1657 06/08/20 1010   06/07/20 1400  ampicillin (OMNIPEN) 2 g in sodium chloride 0.9 % 100 mL IVPB  Status:  Discontinued        2 g 300 mL/hr over 20 Minutes Intravenous Every 8 hours 06/07/20 1025 06/07/20 1637    06/06/20 1600  piperacillin-tazobactam (ZOSYN) IVPB 2.25 g  Status:  Discontinued        2.25 g 100 mL/hr over 30 Minutes Intravenous Every 8 hours 06/06/20 1403 06/07/20 1025   06/03/20 1400  azithromycin (ZITHROMAX) 500 mg in sodium chloride 0.9 % 250 mL IVPB  Status:  Discontinued        500 mg 250 mL/hr over 60 Minutes Intravenous Every 24 hours 06/03/20 1322 06/06/20 1319   06/03/20 1400  cefTRIAXone (ROCEPHIN) 1 g in sodium chloride 0.9 % 100 mL IVPB  Status:  Discontinued        1 g 200 mL/hr over 30 Minutes Intravenous Every 24 hours 06/03/20 1322 06/06/20 1319       Significant Events: From Dr. Patsey Berthold note on 9/25: "9/20 TEE performed Tee revealed no evidence of endocartidits. Procedure complicated by hypotension and bradycaradia with heart rates dropping to mid 20's and blood pressure systolic to 15Q. Received 1 amp of atropine, amp of epinephrine and neosynephrine. Pt intubated to protect air way. Stat head ct and chest ct were unremarkable. Pt son notified immediately and has been updated with care. 9/21 remains on vent, plan for SAT/SBT 9/22 plan for OR today  MRI BRAIN-IMPRESSION: 1. No acute intracranial abnormality. 2. Few small remote lacunar infarcts involving the pons. 3. Otherwise normal brain MRI for age. 9/23 HD performed patient extubated after successful SAT/SBT 9/24 uneventful day, still somewhat encephalopathic but clearing"  Objective   Vitals:   06/17/20 1130 06/17/20 1145 06/17/20 1200 06/17/20 1215  BP:  (!) 158/72    Pulse: (!) 137 71 74 71  Resp:  17    Temp:  98.2 F (36.8 C)    TempSrc:  Axillary    SpO2: 90% 93% 97% 92%  Weight:  Height:        Intake/Output Summary (Last 24 hours) at 06/17/2020 1643 Last data filed at 06/17/2020 0328 Gross per 24 hour  Intake 229.32 ml  Output 1100 ml  Net -870.68 ml   Filed Weights   06/13/20 0500 06/14/20 0500 06/15/20 0417  Weight: 76.3 kg 82.2 kg 81.4 kg    Physical Exam:  General  exam: awake, alert, no acute distress but aggravated HEENT: moist mucus membranes, hearing grossly normal  Respiratory system: CTAB, normal respiratory effort. Cardiovascular system: normal S1/S2, RRR, no pedal edema.   Gastrointestinal system: soft, NT, ND Central nervous system: no gross focal neurologic deficits, normal speech Extremities: RUE dressing overlying AV fistula site with no signs of bleeding, no edema Psychiatry: irritable mood, congruent affect, abnormal judgement and insight   Labs   Data Reviewed: I have personally reviewed following labs and imaging studies  CBC: Recent Labs  Lab 06/14/20 0726 06/14/20 0726 06/14/20 1648 06/15/20 0505 06/15/20 1609 06/16/20 0545 06/17/20 0628  WBC 26.9*  --  31.8* 26.8*  --  13.9* 7.3  HGB 9.1*   < > 7.7* 6.4* 7.4* 7.9* 7.4*  HCT 26.1*   < > 21.6* 18.6* 22.1* 23.3* 21.9*  MCV 82.1  --  83.7 84.9  --  87.6 86.2  PLT 207  --  203 180  --  181 190   < > = values in this interval not displayed.   Basic Metabolic Panel: Recent Labs  Lab 06/13/20 0532 06/13/20 0532 06/14/20 5456 06/14/20 1648 06/15/20 0505 06/16/20 0545 06/17/20 0620  NA 136  138   < > 132* 137 136 135 136  K 3.4*  3.3*   < > 5.3* 2.7* 3.0* 3.1* 2.9*  CL 97*  96*   < > 95* 95* 96* 96* 98  CO2 26  27   < > 20* _0 GLUCOSE 83  88   < > 109* 91 85 113* 105*  BUN 14  13   < > _1 CREATININE 4.39*  4.36*   < > 5.74* 2.25* 3.96* 4.84* 3.40*  CALCIUM 7.6*  7.9*   < > 7.1* 7.4* 7.0* 7.6* 8.0*  MG 1.8  --  1.6*  --  1.5* 1.7 1.8  PHOS 3.2   < > 5.3* 2.1* 3.2 4.3 2.8   < > = values in this interval not displayed.   GFR: Estimated Creatinine Clearance: 18.3 mL/min (A) (by C-G formula based on SCr of 3.4 mg/dL (H)). Liver Function Tests: Recent Labs  Lab 06/14/20 0726 06/14/20 1648 06/15/20 0505 06/16/20 0545 06/17/20 0620  ALBUMIN 2.3* 2.3* 2.0* 2.2* 2.3*   No results for input(s): LIPASE, AMYLASE in the last 168 hours. No  results for input(s): AMMONIA in the last 168 hours. Coagulation Profile: Recent Labs  Lab 06/13/20 0532  INR 1.3*   Cardiac Enzymes: No results for input(s): CKTOTAL, CKMB, CKMBINDEX, TROPONINI in the last 168 hours. BNP (last 3 results) No results for input(s): PROBNP in the last 8760 hours. HbA1C: No results for input(s): HGBA1C in the last 72 hours. CBG: Recent Labs  Lab 06/16/20 1546 06/16/20 2317 06/17/20 0405 06/17/20 0736 06/17/20 1147  GLUCAP 137* 115* 102* 92 80   Lipid Profile: No results for input(s): CHOL, HDL, LDLCALC, TRIG, CHOLHDL, LDLDIRECT in the last 72 hours. Thyroid Function Tests: No results for input(s): TSH, T4TOTAL, FREET4, T3FREE, THYROIDAB in the last 72 hours. Anemia Panel: No results for  input(s): VITAMINB12, FOLATE, FERRITIN, TIBC, IRON, RETICCTPCT in the last 72 hours. Sepsis Labs: No results for input(s): PROCALCITON, LATICACIDVEN in the last 168 hours.  Recent Results (from the past 240 hour(s))  CULTURE, BLOOD (ROUTINE X 2) w Reflex to ID Panel     Status: None   Collection Time: 06/07/20  7:02 PM   Specimen: BLOOD  Result Value Ref Range Status   Specimen Description BLOOD BLOOD LEFT FOREARM  Final   Special Requests   Final    BOTTLES DRAWN AEROBIC AND ANAEROBIC Blood Culture results may not be optimal due to an inadequate volume of blood received in culture bottles   Culture   Final    NO GROWTH 5 DAYS Performed at [facility], [address] [place], [place] [address]    Report Status 06/12/2020 FINAL  Final  CULTURE, BLOOD (ROUTINE X 2) w Reflex to ID Panel     Status: None   Collection Time: 06/07/20  8:46 PM   Specimen: BLOOD  Result Value Ref Range Status   Specimen Description BLOOD BLOOD LEFT HAND  Final   Special Requests   Final    BOTTLES DRAWN AEROBIC ONLY Blood Culture results may not be optimal due to an inadequate volume of blood received in culture bottles   Culture   Final    NO GROWTH 5  DAYS Performed at [facility], [address] [place], [place] [address]    Report Status 06/12/2020 FINAL  Final  MRSA PCR Screening     Status: None   Collection Time: 06/11/20 10:40 AM   Specimen: Nasopharyngeal  Result Value Ref Range Status   MRSA by PCR NEGATIVE NEGATIVE Final    Comment:        The GeneXpert MRSA Assay (FDA approved for NASAL specimens only), is one component of a comprehensive MRSA colonization surveillance program. It is not intended to diagnose MRSA infection nor to guide or monitor treatment for MRSA infections. Performed at [facility], [address] [place], [place] [address]   Aerobic/Anaerobic Culture (surgical/deep wound)     Status: None (Preliminary result)   Collection Time: 06/13/20  2:23 PM   Specimen: Wound; Abscess  Result Value Ref Range Status   Specimen Description   Final    WOUND Performed at [facility], [address] [place], [place] [address]    Special Requests   Final    RIGHT ARM PERI GRAFT ABSCESS Performed at [facility], [address] [place], [place] [address]    Gram Stain   Final    NO WBC SEEN NO ORGANISMS SEEN Performed at [facility], [place] [address] [place], [place] [address]    Culture   Final    RARE ENTEROCOCCUS FAECALIS NO ANAEROBES ISOLATED; CULTURE IN PROGRESS FOR 5 DAYS    Report Status PENDING  Incomplete   Organism ID, Bacteria ENTEROCOCCUS FAECALIS  Final      Susceptibility   Enterococcus faecalis - MIC*    AMPICILLIN <=2 SENSITIVE Sensitive     VANCOMYCIN 1 SENSITIVE Sensitive     GENTAMICIN SYNERGY SENSITIVE Sensitive     * RARE ENTEROCOCCUS FAECALIS      Imaging Studies   CT ABDOMEN PELVIS WO CONTRAST  Result Date: 06/16/2020 CLINICAL DATA:  Severe abdominal pain. Suspicion for retroperitoneal hemorrhage. EXAM: CT ABDOMEN AND PELVIS WITHOUT CONTRAST TECHNIQUE: Multidetector CT imaging of the abdomen and pelvis was performed  following the standard protocol without  IV contrast. COMPARISON:  04/06/2020 FINDINGS: Lower chest: New small bilateral pleural effusions and bibasilar infiltrates noted. Hepatobiliary:  No mass visualized on this unenhanced exam. Pancreas: No mass or inflammatory process visualized on this unenhanced exam. Spleen:  Within normal limits in size. Adrenals/Urinary tract: Diffuse bilateral renal parenchymal atrophy, consistent with end-stage renal disease. No evidence of urolithiasis or hydronephrosis. Unremarkable unopacified urinary bladder. Stomach/Bowel: No evidence of obstruction, inflammatory process, or abnormal fluid collections. Colonic diverticulosis is again seen, without evidence of diverticulitis. Vascular/Lymphatic: No pathologically enlarged lymph nodes identified. No evidence of abdominal aortic aneurysm. Aortic atherosclerosis noted. Reproductive: Prior hysterectomy noted. Adnexal regions are unremarkable in appearance. Other: New moderate diffuse body wall edema and mild mesenteric edema. No evidence of retroperitoneal hemorrhage or ascites. Musculoskeletal:  No suspicious bone lesions identified. IMPRESSION: New small bilateral pleural effusions, diffuse body wall edema, and mild mesenteric edema, consistent with 3rd spacing. New bibasilar atelectasis versus infiltrates. No evidence of retroperitoneal hemorrhage. Colonic diverticulosis, without radiographic evidence of diverticulitis. Aortic Atherosclerosis (ICD10-I70.0). Electronically Signed   By: Marlaine Hind M.D.   On: 06/16/2020 12:05     Medications   Scheduled Meds: . aspirin EC  81 mg Oral Daily  . atorvastatin  20 mg Oral Daily  . chlorhexidine gluconate (MEDLINE KIT)  15 mL Mouth Rinse BID  . Chlorhexidine Gluconate Cloth  6 each Topical Daily  . cholestyramine light  4 g Oral BID  . dextrose  1 ampule Intravenous Once  . epoetin (EPOGEN/PROCRIT) injection  10,000 Units Intravenous Q T,Th,Sa-HD  . feeding supplement (ENSURE  ENLIVE)  237 mL Oral BID BM  . ferrous sulfate  325 mg Oral Daily  . gabapentin  100 mg Oral QPM  . heparin  5,000 Units Subcutaneous Q8H  . hydrOXYzine  25 mg Oral Daily  . lipase/protease/amylase  12,000 Units Oral TID WC  . multivitamin  1 tablet Oral Daily  . pantoprazole (PROTONIX) IV  40 mg Intravenous Q24H  . cyanocobalamin  1,000 mcg Oral Daily   Continuous Infusions: . sodium chloride Stopped (06/17/20 0328)  . ampicillin (OMNIPEN) IV 2 g (06/17/20 1038)       LOS: 14 days    Time spent: 45 minutes with > 50% spent in coordination of care and direct patient contact    Ezekiel Slocumb, DO [facility]  06/17/2020, 4:43 PM    If 7PM-7AM, please contact night-coverage. How to contact the [facility] Attending or Consulting provider [place] or covering provider during after hours [place], for this patient?    1. Check the care team in [facility] and look for a) attending/consulting [facility] provider listed and b) the [facility] team listed 2. Log into [email or web address] and use [place]'s universal password to access. If you do not have the password, please contact the hospital operator. 3. Locate the [facility] provider you are looking for under [facility] and page to a number that you can be directly reached. 4. If you still have difficulty reaching the provider, please page the [facility] (Director on Call) for the Hospitalists listed on amion for assistance.

## 2020-06-17 NOTE — Hospital Course (Addendum)
59 year old female with past medical history of ESRD on dialysis (MWF), dCHF, anemia, CAD, renal cell cancer, pancreatitis, anemia, hypertension, diabetes mellitus, COPD, asthma, depression, anxiety, who was admitted on 06/02/2020 with fever/chills, shortness of breath.  Admitted with pneumonia and volume overloaded after missing dialysis.  BP uncontrolled on presentation with systolic BP in 570'V.  She underwent dialysis the evening of admission.   Patient apparently refused blood draw for initial blood cultures, these were eventually obtained on 9/13 and returned positive for Enterococcus faecalis which is thought secondary to AV graft infection with abscess.  ID was consulted.   A temporary R femoral dialysis catheter was placed on 9/18.  TEE done on 9/20 was negative for endocarditis.  During the procedure, patient became hypotensive (systolic 77'L) and bradycardic (HR 20's), apparently was a near cardiac arrest, and was in acute severe respiratory failure.  Intubated and transferred to ICU, required vasopressors.   9/22 Vascular surgery removed the infected RUE AV graft and performed endarterectomy of the R brachial artery with Cormatrix patch.  Required 2 units pRBC transfusion due to blood loss.  9/23 patient successfully extubated.  Remained confused, but encephalopathy improving.  9/25 severe abdominal pain, concern for retroperitoneal hemorrhage which was ruled out by CT abdomen/pelvis.  Family reported patient's pain from gastroparesis presents in similar fashion.  Patient transferred out of ICU later on 9/25 to Progressive and [facility] assumed care of patient on 9/26.

## 2020-06-18 LAB — RENAL FUNCTION PANEL
Albumin: 2.3 g/dL — ABNORMAL LOW (ref 3.5–5.0)
Anion gap: 12 (ref 5–15)
BUN: 22 mg/dL (ref 8–23)
CO2: 26 mmol/L (ref 22–32)
Calcium: 8.4 mg/dL — ABNORMAL LOW (ref 8.9–10.3)
Chloride: 96 mmol/L — ABNORMAL LOW (ref 98–111)
Creatinine, Ser: 4.77 mg/dL — ABNORMAL HIGH (ref 0.44–1.00)
GFR calc Af Amer: 11 mL/min — ABNORMAL LOW (ref >60)
GFR calc non Af Amer: 9 mL/min — ABNORMAL LOW (ref >60)
Glucose, Bld: 92 mg/dL (ref 70–99)
Phosphorus: 3.5 mg/dL (ref 2.5–4.6)
Potassium: 3.9 mmol/L (ref 3.5–5.1)
Sodium: 134 mmol/L — ABNORMAL LOW (ref 135–145)

## 2020-06-18 LAB — BASIC METABOLIC PANEL
Anion gap: 12 (ref 5–15)
BUN: 23 mg/dL (ref 8–23)
CO2: 27 mmol/L (ref 22–32)
Calcium: 8.5 mg/dL — ABNORMAL LOW (ref 8.9–10.3)
Chloride: 96 mmol/L — ABNORMAL LOW (ref 98–111)
Creatinine, Ser: 4.83 mg/dL — ABNORMAL HIGH (ref 0.44–1.00)
GFR calc Af Amer: 10 mL/min — ABNORMAL LOW (ref >60)
GFR calc non Af Amer: 9 mL/min — ABNORMAL LOW (ref >60)
Glucose, Bld: 95 mg/dL (ref 70–99)
Potassium: 4 mmol/L (ref 3.5–5.1)
Sodium: 135 mmol/L (ref 135–145)

## 2020-06-18 LAB — GLUCOSE, CAPILLARY
Glucose-Capillary: 79 mg/dL (ref 70–99)
Glucose-Capillary: 75 mg/dL (ref 70–99)
Glucose-Capillary: 69 mg/dL — ABNORMAL LOW (ref 70–99)
Glucose-Capillary: 87 mg/dL (ref 70–99)
Glucose-Capillary: 90 mg/dL (ref 70–99)

## 2020-06-18 LAB — MAGNESIUM: Magnesium: 1.9 mg/dL (ref 1.7–2.4)

## 2020-06-18 LAB — CBC
HCT: 25.3 % — ABNORMAL LOW (ref 36.0–46.0)
Hemoglobin: 8.2 g/dL — ABNORMAL LOW (ref 12.0–15.0)
MCH: 28.6 pg (ref 26.0–34.0)
MCHC: 32.4 g/dL (ref 30.0–36.0)
MCV: 88.2 fL (ref 80.0–100.0)
Platelets: 216 10*3/uL (ref 150–400)
RBC: 2.87 MIL/uL — ABNORMAL LOW (ref 3.87–5.11)
RDW: 16.9 % — ABNORMAL HIGH (ref 11.5–15.5)
WBC: 6.2 10*3/uL (ref 4.0–10.5)
nRBC: 0 % (ref 0.0–0.2)

## 2020-06-18 LAB — AEROBIC/ANAEROBIC CULTURE W GRAM STAIN (SURGICAL/DEEP WOUND): Gram Stain: NONE SEEN

## 2020-06-18 MED ORDER — EPOETIN ALFA 10000 UNIT/ML IJ SOLN
10000.0000 [IU] | INTRAMUSCULAR | Status: DC
  Administered 2020-06-20 – 2020-06-25 (×3): 10000 [IU] via INTRAVENOUS

## 2020-06-18 MED ORDER — CARVEDILOL 6.25 MG PO TABS
6.2500 mg | ORAL_TABLET | Freq: Two times a day (BID) | ORAL | Status: DC
  Administered 2020-06-18 – 2020-06-24 (×10): 6.25 mg via ORAL
  Filled 2020-06-18 (×11): qty 1

## 2020-06-18 NOTE — Progress Notes (Signed)
PROGRESS NOTE    [name]   ZOX:[id number]  DOB: August 18, 1961  PCP: [facility] [place]    DOA: 06/02/2020 LOS: 12   Brief Narrative   59 year old female with past medical history of ESRD on dialysis (MWF), dCHF, anemia, CAD, renal cell cancer, pancreatitis, anemia, hypertension, diabetes mellitus, COPD, asthma, depression, anxiety, who was admitted on 06/02/2020 with fever/chills, shortness of breath.  Admitted with pneumonia and volume overloaded after missing dialysis.  BP uncontrolled on presentation with systolic BP in 811'B.  She underwent dialysis the evening of admission.   Patient apparently refused blood draw for initial blood cultures, these were eventually obtained on 9/13 and returned positive for Enterococcus faecalis which is thought secondary to AV graft infection with abscess.  ID was consulted.   A temporary R femoral dialysis catheter was placed on 9/18.  TEE done on 9/20 was negative for endocarditis.  During the procedure, patient became hypotensive (systolic 14'N) and bradycardic (HR 20's), apparently was a near cardiac arrest, and was in acute severe respiratory failure.  Intubated and transferred to ICU, required vasopressors.   9/22 Vascular surgery removed the infected RUE AV graft and performed endarterectomy of the R brachial artery with Cormatrix patch.  Required 2 units pRBC transfusion due to blood loss.  9/23 patient successfully extubated.  Remained confused, but encephalopathy improving.  9/25 severe abdominal pain, concern for retroperitoneal hemorrhage which was ruled out by CT abdomen/pelvis.  Patient transferred out of ICU later on 9/25 to Progressive and TRH assumed care of patient on 9/26.    Assessment & Plan   Principal Problem:   Bacteremia due to Enterococcus Active Problems:   Acute respiratory failure with hypoxia (HCC)   Anemia of chronic disease   Acute diastolic CHF (congestive heart failure) (HCC)   COPD (chronic  obstructive pulmonary disease) (HCC)   ESRD on hemodialysis (HCC)   Benign essential HTN   Obesity (BMI 30-39.9)    Severe sepsis secondary to Enterococcus faecalis bacteremia  Source appears to have been infected AV hero graft which was removed 9/22. ID is following.  Was on Ampicillin + Gentamicin, currently on Ampicillin monotherapy. Repeat blood cultures are negative to date - follow.   Acute Hypoxic  Respiratory Failure - Resolved - extubated 9/23, on room air Hypotension off vasopressors   Acute on chronic diastolic CHF / Volume Overload - volume being managed with dialysis and has improved.  Now appears euvolemic.     ESRD on dialysis - M/W/F dialysis.  Nephrology following. --Likely new Permcath to be placed tomorrow  Acute blood loss anemia superimposed on anemia of chronic renal disease - pt required blood transfusions after AV graft revision, s/p 2 units pRBCs 9/23 and 9/24.  Monitor CBC.  On Epo.  Monitor for bleeding.  Hbg currently at/near baseline.   Acute toxic/metabolic encephalopathy - appears was mostly due to sedating medications.  Mentation improving. --Delirium precautions:     -Lights and TV off, minimize interruptions at night    -Blinds open and lights on during day    -Glasses/hearing aid with patient    -Frequent reorientation    -PT/OT when able    -Avoid sedation medications/Beers list medications  Generalized weakness - PT and OT evaluations after ICU are pending.  Cannot mobilize until femoral line removed.   Obesity: Body mass index is 29.86 kg/m.  Complicates overall care and prognosis.     DVT prophylaxis: heparin injection 5,000 Units Start: 06/02/20 2300   Diet:  Diet Orders (From admission, onward)    Start     Ordered   06/17/20 1346  DIET DYS 2 Room service appropriate? Yes; Fluid consistency: Thin  Diet effective now       Question Answer Comment  Room service appropriate? Yes   Fluid consistency: Thin      06/17/20  1345            Code Status: Full Code    Subjective 06/18/20    Patient seen on rounds this AM with nurse at bedside. She was sleeping but woke up easily.  Denies abdominal pain at this time.  No acute events reported.  She is still frustrated not being able to get up.  Advised her we need the femoral line taken out before she can mobilize.  She got upset but understood.   Disposition Plan & Communication   Status is: Inpatient  Remains inpatient appropriate because:Inpatient level of care appropriate due to severity of illness.  Therapy evaluations pending, needs new PermCath placed.    Dispo: The patient is from: Home              Anticipated d/c is to: Home              Anticipated d/c date is: 2 days              Patient currently is not medically stable to d/c.    Family Communication: none at bedside, will attempt to call.    Consults, Procedures, Significant Events   Consultants:   Nephrology  PCCM  Vascular surgery  Cardiology  Procedures:   9/18 temporary femoral dialysis cath placed  9/20 TEE, intubated  9/22 removal of AV graft & R brachial endarterectomy  Dialysis  Antimicrobials:  Anti-infectives (From admission, onward)   Start     Dose/Rate Route Frequency Ordered Stop   06/16/20 1200  gentamicin (GARAMYCIN) IVPB 80 mg  Status:  Discontinued        80 mg 100 mL/hr over 30 Minutes Intravenous Every T-Th-Sa (Hemodialysis) 06/15/20 0803 06/15/20 1350   06/15/20 1200  gentamicin (GARAMYCIN) IVPB 80 mg  Status:  Discontinued        80 mg 100 mL/hr over 30 Minutes Intravenous Every M-W-F (Hemodialysis) 06/13/20 1404 06/15/20 0803   06/14/20 1800  gentamicin (GARAMYCIN) 60 mg in dextrose 5 % 50 mL IVPB        60 mg 103 mL/hr over 30 Minutes Intravenous  Once 06/14/20 1643 06/14/20 1819   06/13/20 1800  gentamicin (GARAMYCIN) IVPB 80 mg  Status:  Discontinued        80 mg 100 mL/hr over 30 Minutes Intravenous Every M-W-F (Hemodialysis)  06/12/20 1402 06/13/20 1404   06/13/20 0600  vancomycin (VANCOCIN) IVPB 1000 mg/200 mL premix        1,000 mg 200 mL/hr over 60 Minutes Intravenous On call to O.R. 06/13/20 0114 06/13/20 1350   06/12/20 1600  gentamicin (GARAMYCIN) IVPB 60 mg  Status:  Discontinued        60 mg 100 mL/hr over 30 Minutes Intravenous  Once 06/12/20 1402 06/12/20 1412   06/12/20 1600  gentamicin (GARAMYCIN) 60 mg in dextrose 5 % 50 mL IVPB        60 mg 103 mL/hr over 30 Minutes Intravenous  Once 06/12/20 1412 06/12/20 2105   06/11/20 1200  gentamicin (GARAMYCIN) IVPB 80 mg  Status:  Discontinued        80 mg 100 mL/hr over 30  Minutes Intravenous Every M-W-F (Hemodialysis) 06/08/20 1356 06/12/20 1402   06/10/20 1900  gentamicin (GARAMYCIN) IVPB 80 mg        80 mg 100 mL/hr over 30 Minutes Intravenous  Once 06/10/20 1722 06/11/20 0056   06/08/20 1800  gentamicin (GARAMYCIN) IVPB 100 mg  Status:  Discontinued        100 mg 200 mL/hr over 30 Minutes Intravenous  Once 06/08/20 1356 06/08/20 1425   06/08/20 1800  gentamicin (GARAMYCIN) IVPB 100 mg  Status:  Discontinued        100 mg 200 mL/hr over 30 Minutes Intravenous  Once 06/08/20 1425 06/08/20 1430   06/08/20 1800  gentamicin (GARAMYCIN) IVPB 100 mg  Status:  Discontinued        100 mg 200 mL/hr over 30 Minutes Intravenous  Once 06/08/20 1430 06/08/20 1441   06/08/20 1800  gentamicin (GARAMYCIN) 100 mg in dextrose 5 % 50 mL IVPB        100 mg 105 mL/hr over 30 Minutes Intravenous  Once 06/08/20 1441 06/08/20 1927   06/08/20 1500  ampicillin (OMNIPEN) 2 g in sodium chloride 0.9 % 100 mL IVPB        2 g 300 mL/hr over 20 Minutes Intravenous Every 12 hours 06/08/20 1356     06/07/20 2200  piperacillin-tazobactam (ZOSYN) IVPB 2.25 g  Status:  Discontinued        2.25 g 100 mL/hr over 30 Minutes Intravenous Every 8 hours 06/07/20 1657 06/08/20 1010   06/07/20 1400  ampicillin (OMNIPEN) 2 g in sodium chloride 0.9 % 100 mL IVPB  Status:  Discontinued         2 g 300 mL/hr over 20 Minutes Intravenous Every 8 hours 06/07/20 1025 06/07/20 1637   06/06/20 1600  piperacillin-tazobactam (ZOSYN) IVPB 2.25 g  Status:  Discontinued        2.25 g 100 mL/hr over 30 Minutes Intravenous Every 8 hours 06/06/20 1403 06/07/20 1025   06/03/20 1400  azithromycin (ZITHROMAX) 500 mg in sodium chloride 0.9 % 250 mL IVPB  Status:  Discontinued        500 mg 250 mL/hr over 60 Minutes Intravenous Every 24 hours 06/03/20 1322 06/06/20 1319   06/03/20 1400  cefTRIAXone (ROCEPHIN) 1 g in sodium chloride 0.9 % 100 mL IVPB  Status:  Discontinued        1 g 200 mL/hr over 30 Minutes Intravenous Every 24 hours 06/03/20 1322 06/06/20 1319       Significant Events: From Dr. Patsey Berthold note on 9/25: "9/20 TEE performed Tee revealed no evidence of endocartidits. Procedure complicated by hypotension and bradycaradia with heart rates dropping to mid 20's and blood pressure systolic to 39Q. Received 1 amp of atropine, amp of epinephrine and neosynephrine. Pt intubated to protect air way. Stat head ct and chest ct were unremarkable. Pt son notified immediately and has been updated with care. 9/21 remains on vent, plan for SAT/SBT 9/22 plan for OR today  MRI BRAIN-IMPRESSION: 1. No acute intracranial abnormality. 2. Few small remote lacunar infarcts involving the pons. 3. Otherwise normal brain MRI for age. 9/23 HD performed patient extubated after successful SAT/SBT 9/24 uneventful day, still somewhat encephalopathic but clearing"  Objective   Vitals:   06/17/20 1700 06/18/20 0400 06/18/20 0802 06/18/20 1211  BP:  (!) 148/56 (!) 151/86 (!) 160/83  Pulse:   65 61  Resp: (!) _0 Temp:  (!) 97.5 F (36.4 C) 98 F (36.7 C)  98.3 F (36.8 C)  TempSrc:  Oral Oral Oral  SpO2:  100% 97% 100%  Weight:      Height:        Intake/Output Summary (Last 24 hours) at 06/18/2020 1338 Last data filed at 06/17/2020 2247 Gross per 24 hour  Intake --  Output 0 ml  Net 0 ml    Filed Weights   06/13/20 0500 06/14/20 0500 06/15/20 0417  Weight: 76.3 kg 82.2 kg 81.4 kg    Physical Exam:  General exam: awake, alert, no acute distress  Respiratory system: CTAB, normal respiratory effort. Cardiovascular system: normal S1/S2, RRR, no pedal edema.   Gastrointestinal system: soft, NT, ND Central nervous system: no gross focal neurologic deficits, normal speech Extremities: RUE AV fistula site with clean incision, intact staples, no surrounding warmth induration or erythema Psychiatry: irritable mood, congruent affect  Labs   Data Reviewed: I have personally reviewed following labs and imaging studies  CBC: Recent Labs  Lab 06/14/20 0726 06/14/20 0726 06/14/20 1648 06/15/20 0505 06/15/20 1609 06/16/20 0545 06/17/20 0628  WBC 26.9*  --  31.8* 26.8*  --  13.9* 7.3  HGB 9.1*   < > 7.7* 6.4* 7.4* 7.9* 7.4*  HCT 26.1*   < > 21.6* 18.6* 22.1* 23.3* 21.9*  MCV 82.1  --  83.7 84.9  --  87.6 86.2  PLT 207  --  203 180  --  181 190   < > = values in this interval not displayed.   Basic Metabolic Panel: Recent Labs  Lab 06/13/20 0532 06/13/20 0532 06/14/20 1103 06/14/20 0726 06/14/20 1648 06/15/20 0505 06/16/20 0545 06/17/20 0620 06/17/20 1957  NA 136  138   < > 132*  --  137 136 135 136  --   K 3.4*  3.3*   < > 5.3*   < > 2.7* 3.0* 3.1* 2.9* 3.8  CL 97*  96*   < > 95*  --  95* 96* 96* 98  --   CO2 26  27   < > 20*  --  _0 --   GLUCOSE 83  88   < > 109*  --  91 85 113* 105*  --   BUN 14  13   < > 20  --  _1 --   CREATININE 4.39*  4.36*   < > 5.74*  --  2.25* 3.96* 4.84* 3.40*  --   CALCIUM 7.6*  7.9*   < > 7.1*  --  7.4* 7.0* 7.6* 8.0*  --   MG 1.8  --  1.6*  --   --  1.5* 1.7 1.8  --   PHOS 3.2   < > 5.3*  --  2.1* 3.2 4.3 2.8  --    < > = values in this interval not displayed.   GFR: Estimated Creatinine Clearance: 18.3 mL/min (A) (by C-G formula based on SCr of 3.4 mg/dL (H)). Liver Function Tests: Recent Labs   Lab 06/14/20 0726 06/14/20 1648 06/15/20 0505 06/16/20 0545 06/17/20 0620  ALBUMIN 2.3* 2.3* 2.0* 2.2* 2.3*   No results for input(s): LIPASE, AMYLASE in the last 168 hours. No results for input(s): AMMONIA in the last 168 hours. Coagulation Profile: Recent Labs  Lab 06/13/20 0532  INR 1.3*   Cardiac Enzymes: No results for input(s): CKTOTAL, CKMB, CKMBINDEX, TROPONINI in the last 168 hours. BNP (last 3 results) No results for input(s): PROBNP in the last 8760 hours. HbA1C:  No results for input(s): HGBA1C in the last 72 hours. CBG: Recent Labs  Lab 06/17/20 1643 06/17/20 2016 06/18/20 0456 06/18/20 0801 06/18/20 1210  GLUCAP 135* 148* 79 75 69*   Lipid Profile: No results for input(s): CHOL, HDL, LDLCALC, TRIG, CHOLHDL, LDLDIRECT in the last 72 hours. Thyroid Function Tests: No results for input(s): TSH, T4TOTAL, FREET4, T3FREE, THYROIDAB in the last 72 hours. Anemia Panel: No results for input(s): VITAMINB12, FOLATE, FERRITIN, TIBC, IRON, RETICCTPCT in the last 72 hours. Sepsis Labs: No results for input(s): PROCALCITON, LATICACIDVEN in the last 168 hours.  Recent Results (from the past 240 hour(s))  MRSA PCR Screening     Status: None   Collection Time: 06/11/20 10:40 AM   Specimen: Nasopharyngeal  Result Value Ref Range Status   MRSA by PCR NEGATIVE NEGATIVE Final    Comment:        The GeneXpert MRSA Assay (FDA approved for NASAL specimens only), is one component of a comprehensive MRSA colonization surveillance program. It is not intended to diagnose MRSA infection nor to guide or monitor treatment for MRSA infections. Performed at [facility], [address], [place], [place] [address]   Aerobic/Anaerobic Culture (surgical/deep wound)     Status: None   Collection Time: 06/13/20  2:23 PM   Specimen: Wound; Abscess  Result Value Ref Range Status   Specimen Description   Final    WOUND Performed at [facility], [address], [place], [place] [address]    Special Requests   Final    RIGHT ARM PERI GRAFT ABSCESS Performed at [facility], [place], [place] [address]    Gram Stain NO WBC SEEN NO ORGANISMS SEEN   Final   Culture   Final    RARE ENTEROCOCCUS FAECALIS NO ANAEROBES ISOLATED Performed at [facility], [place] [address] [place], [place] [address]    Report Status 06/18/2020 FINAL  Final   Organism ID, Bacteria ENTEROCOCCUS FAECALIS  Final      Susceptibility   Enterococcus faecalis - MIC*    AMPICILLIN <=2 SENSITIVE Sensitive     VANCOMYCIN 1 SENSITIVE Sensitive     GENTAMICIN SYNERGY SENSITIVE Sensitive     * RARE ENTEROCOCCUS FAECALIS      Imaging Studies   No results found.   Medications   Scheduled Meds: . aspirin EC  81 mg Oral Daily  . atorvastatin  20 mg Oral Daily  . carvedilol  6.25 mg Oral BID WC  . chlorhexidine gluconate (MEDLINE KIT)  15 mL Mouth Rinse BID  . Chlorhexidine Gluconate Cloth  6 each Topical Daily  . cholestyramine light  4 g Oral BID  . dextrose  1 ampule Intravenous Once  . epoetin (EPOGEN/PROCRIT) injection  10,000 Units Intravenous Q T,Th,Sa-HD  . feeding supplement (ENSURE ENLIVE)  237 mL Oral BID BM  . ferrous sulfate  325 mg Oral Daily  . gabapentin  100 mg Oral QPM  . heparin  5,000 Units Subcutaneous Q8H  . hydrOXYzine  25 mg Oral Daily  . lipase/protease/amylase  12,000 Units Oral TID WC  . multivitamin  1 tablet Oral Daily  . pantoprazole (PROTONIX) IV  40 mg Intravenous Q24H  . cyanocobalamin  1,000 mcg Oral Daily   Continuous Infusions: . sodium chloride Stopped (06/17/20 0328)  . ampicillin (OMNIPEN) IV 2 g (06/18/20 0940)       LOS: 15 days    Time spent: 25 minutes with > 50% spent  in coordination of care and direct patient contact    Ezekiel Slocumb, DO [facility]  06/18/2020, 1:38 PM    If 7PM-7AM, please contact night-coverage. How to contact the [facility] Attending or Consulting  provider [place] or covering provider during after hours [place], for this patient?    1. Check the care team in [facility] and look for a) attending/consulting [facility] provider listed and b) the [facility] team listed 2. Log into [email or web address] and use [place]'s universal password to access. If you do not have the password, please contact the hospital operator. 3. Locate the [facility] provider you are looking for under [facility] and page to a number that you can be directly reached. 4. If you still have difficulty reaching the provider, please page the [facility] (Director on Call) for the Hospitalists listed on amion for assistance.

## 2020-06-18 NOTE — Consult Note (Signed)
Pharmacy Antibiotic Note  [name] is a 59 y.o. female admitted on 06/02/2020 with respiratory failure secondary to acute diastolic CHF and COPD exacerbation. PMH includes ESRD on HD, HTN, DM, CAD, PCD.  Pharmacy has been consulted for ampicillin dosing.  Patient with ESRD on HD with numerous graft complications. Erythemous firm swelling in right cubital area of graft arm.  Graft site noted as possible source of bloodstream infection, removed 9/22.  Today, 06/18/2020 Day #12 Ampicillin,  Gentamicinm stopped 9/24 per ID - AVG removed 9/22 with culture growing E. faecalis - HD 9/23 -9/21 Gentamicin level obtained at 0425 and resulted as 3 (goal peak: 3-4 mg/L, goal trough <1) s/p LD of 100mg  9/17 at 1800 and MD of 80 mg 9/20 at 0026 w/ one 2 hour dialysis session on 9/19 with BFR 250 (max: 400). Full dialysis session planned for today  TTE 9/16: moderate tricuspid valve regurgitation, no vegetation visualized  TEE 9/20: mild to moderate tricuspid valve regurgitation, no vegetation visualized. Procedure complicated by hypotension, respiratory depression, and bradycardia.    Cultures this admission:  BCx 9/13: Enterococcus faecalis, amp sensitive, gent synergy sensitive BCx 9/14: Enterococcus faecalis  BCID 9/14: Enterococcus faecalis detected  BCx 9/16: NGTD in 4/4 bottles  Plan:  Continue ampicillin 2g IV every 12 hours for bacteremia related to AVG  Height: 5\' 5"  (165.1 cm) Weight: 81.4 kg (179 lb 7.3 oz) IBW/kg (Calculated) : 57  Temp (24hrs), Avg:98 F (36.7 C), Min:97.5 F (36.4 C), Max:98.3 F (36.8 C)  Recent Labs  Lab 06/12/20 0425 06/13/20 0532 06/14/20 0726 06/14/20 1648 06/15/20 0505 06/16/20 0545 06/17/20 0620 06/17/20 0628  WBC 10.1   < > 26.9* 31.8* 26.8* 13.9*  --  7.3  CREATININE 5.53*   < > 5.74* 2.25* 3.96* 4.84* 3.40*  --   GENTRANDOM 3.0  --   --   --   --   --   --   --    < > = values in this interval not displayed.    Estimated Creatinine  Clearance: 18.3 mL/min (A) (by C-G formula based on SCr of 3.4 mg/dL (H)).    Allergies  Allergen Reactions  . Ace Inhibitors Swelling and Anaphylaxis  . Ativan [Lorazepam] Other (See Comments)    Reaction:Hallucinations and headaches  . Compazine [Prochlorperazine Edisylate] Anaphylaxis, Nausea And Vomiting and Other (See Comments)    Other reaction(s): dystonia from this vs. Reglan, 23 Jul - patient relates that she takes promethazine frequently with no problems  . Sumatriptan Succinate Other (See Comments)    Other reaction(s): delirium and hallucinations per [facility] records  . Lactose Intolerance (Gi)   . Zofran [Ondansetron] Nausea And Vomiting    Per pt. she is allergic to zofran or will experience adverse reaction like hallucination   . Lac Bovis Nausea And Vomiting  . Losartan Nausea Only  . Prochlorperazine Other (See Comments)    Reaction:  Unknown . Patient does not remember reaction but she does have vertigo and anxiety along with n and v at times. Could be used to treat any of these   . Reglan [Metoclopramide] Other (See Comments)    Per patient her Dr. Evelina Bucy her off it   . Scopolamine Other (See Comments)    Dizziness, also has vertigo already  . Tape Rash    Plastic tape causes rash  . Tapentadol Rash    Antimicrobials this admission: Azithromycin 9/12 >> 9/15 Ceftriaxone 9/12 >> 9/15  Zosyn 9/15 >> 9/17  Ampicillin  9/17 >>  Gentamicin 9/17 >> 9/24  Thank you for allowing pharmacy to be a part of this patient's care.  Doreene Eland, PharmD, BCPS.   Work Cell: [phone] 06/18/2020 12:16 PM

## 2020-06-18 NOTE — Progress Notes (Signed)
  Speech Language Pathology Treatment: Dysphagia  Patient Details Name: [name] MRN: [id number] DOB: 1961-08-06 Today's Date: 06/18/2020 Time: 9211-9417 SLP Time Calculation (min) (ACUTE ONLY): 35 min  Assessment / Plan / Recommendation Clinical Impression  Pt seen for ongoing assessment of swallowing. She appears much improved and alert today; verbally responsive, following instructions w/ a cue, and calling Sister on phone during session to udpate her. Pt is on RA; wbc wnl. Dentures in place.  Pt explained general aspiration precautions and agreed verbally to the need for following them especially sitting upright for all oral intake. Pt assisted w/ positioning then consumed trials of thin liquids and soft solids. No overt clinical s/s of aspiration were noted w/ any consistency; respiratory status remained calm and unlabored, vocal quality clear b/t trials. Pt held Cup for drinking following instructions for single, small sips slowly. Oral phase appeared [facility] for bolus management, mastication of solids, and timely A-P transfer for swallowing; oral clearing achieved w/ all consistencies.  Recommend upgrade to Regular diet w/ cut meats if tough and gravies added to moisten foods; Thin liquids. Recommend general aspiration precautions; Pills Whole in Puree - the puree providing cohesion for swallowing tablets; tray setup and positioning assistance for meals and Dentures placed. Precautions posted at bedside. NSG to reconsult ST services if any new needs arise during session.      HPI HPI: Pt is a 59 y.o. female with medical history significant of end-stage renal disease on hemodialysis Mondays Wednesdays and Fridays, hypertension, COPD, diastolic dysfunction CHF, depression, diabetes, hyperlipidemia, who apparently missed her dialysis on Friday.  Came to the ER with complaining of significant shortness of breath and cough.  Patient was noted to have evidence of fluid overload.  Blood pressure also in  the 200s with obvious hypertensive urgency.  Nephrology consulted and plan to do hemodialysis tonight.  Patient being admitted to the medical service for observation.  She is complaining of significant pain back.  Pt intubated to protect air way during TEE; no endocarditis noted.  Removal of infected right arm hero AV graft; sepsis and bacteremia noted; ACUTE Hypoxic and Hypercapnic Respiratory Failure post op.  Pt remained intubated and was extubated on 06/14/2020.  A/O x2; blood transfusion ongoing during this eval.       SLP Plan  All goals met       Recommendations  Diet recommendations: Regular;Thin liquid Liquids provided via: Cup;Straw Medication Administration: Whole meds with puree (for easier swallowing currently ) Supervision: Patient able to self feed Compensations: Minimize environmental distractions;Slow rate;Small sips/bites;Follow solids with liquid Postural Changes and/or Swallow Maneuvers: Seated upright 90 degrees;Upright 30-60 min after meal                General recommendations:  (Dietician) Oral Care Recommendations: Oral care BID;Oral care before and after PO;Staff/trained caregiver to provide oral care (Denture care) Follow up Recommendations: None SLP Visit Diagnosis: Dysphagia, unspecified (R13.10) Plan: All goals met       GO                 [name], MS, CCC-SLP Speech Language Pathologist Rehab Services [phone] [facility] 06/18/2020, 3:38 PM

## 2020-06-18 NOTE — Progress Notes (Signed)
[facility] Daily Progress Note   Subjective: 06/13/20: 1. Removal of infectedright arm heroAV graft 2. Endarterectomy of theright brachial artery with Cormatrix patch  Transfered out of unit. No issues overnight.   Objective: Vitals:   06/17/20 1650 06/17/20 1700 06/18/20 0400 06/18/20 0802  BP:   (!) 148/56 (!) 151/86  Pulse:    65  Resp: (!) 24 (!) 24 16 19   Temp:   (!) 97.5 F (36.4 C) 98 F (36.7 C)  TempSrc:   Oral Oral  SpO2:   100% 97%  Weight:      Height:        Intake/Output Summary (Last 24 hours) at 06/18/2020 2353 Last data filed at 06/17/2020 2247 Gross per 24 hour  Intake --  Output 0 ml  Net 0 ml   Physical Exam: A&Ox3, NAD CV: RRR Pulmonary: CTA Bilaterally Abdomen: Soft, Nontender, Nondistended Vascular:             Right Upper Extremity: Minimal to moderate swelling. Arm is soft.             Incision: Staples: Iclean dry and intact.   Laboratory: CBC    Component Value Date/Time   WBC 7.3 06/17/2020 0628   HGB 7.4 (L) 06/17/2020 0628   HGB 10.5 (L) 09/15/2014 0948   HCT 21.9 (L) 06/17/2020 0628   HCT 34.2 (L) 09/15/2014 0948   PLT 190 06/17/2020 0628   PLT 203 09/15/2014 0948   BMET    Component Value Date/Time   NA 136 06/17/2020 0620   NA 135 (L) 09/15/2014 0948   K 3.8 06/17/2020 1957   K 4.8 09/15/2014 0948   CL 98 06/17/2020 0620   CL 99 09/15/2014 0948   CO2 27 06/17/2020 0620   CO2 26 09/15/2014 0948   GLUCOSE 105 (H) 06/17/2020 0620   GLUCOSE 118 (H) 09/15/2014 0948   BUN 13 06/17/2020 0620   BUN 19 (H) 09/15/2014 0948   CREATININE 3.40 (H) 06/17/2020 0620   CREATININE 6.79 (H) 09/15/2014 0948   CALCIUM 8.0 (L) 06/17/2020 0620   CALCIUM 8.3 (L) 09/15/2014 0948   GFRNONAA 14 (L) 06/17/2020 0620   GFRNONAA 7 (L) 09/15/2014 0948   GFRNONAA 6 (L) 05/31/2014 0432   GFRAA 16 (L) 06/17/2020 0620   GFRAA 8 (L) 09/15/2014 0948   GFRAA 7 (L) 05/31/2014 0432   Assessment/Planning: The patient is a  59 year old female multiple medical issues including end-stage renal disease status post right upper extremity hero graft/old graft removed- POD#5  1)Right Upper Extremity is soft. Hand is warm. Incision are clean and dry. 2) WBC is normal.  3)Currently maintained by a temporary dialysis catheter will need to transition to PermCath in the future  Discussed with [employer] PA-C 06/18/2020 9:34 AM

## 2020-06-18 NOTE — Progress Notes (Signed)
I picked the patient up from room 253 and transferred her to room 204. VSS. Needs met. Telemetry running Sinus rhythm. IV flushing without difficulty

## 2020-06-18 NOTE — Evaluation (Signed)
Occupational Therapy Evaluation Patient Details Name: [name] MRN: [id number] DOB: 1961/04/30 Today's Date: 06/18/2020    History of Present Illness 59 y.o. female admitted on 06/02/2020 with respiratory failure secondary to acute diastolic CHF and COPD exacerbation. PMH includes ESRD on HD, HTN, DM, CAD, PCD.  Pharmacy has been consulted for ampicillin dosing. Patient with ESRD on HD with numerous graft complications. Erythemous firm swelling in right cubital area of graft arm.  Graft site noted as possible source of bloodstream infection, removed 9/22.   Clinical Impression   Patient presenting with decreased I in self care, balance, functional mobility/transfers, endurance, and safety awareness. Pt also displays decreased cognition for insight to deficits. She has very tangential speech and needing min - mod multimodal cuing to redirect.  Patient reports living at home alone at mod I level PTA. She reports not having someone that could stay and assist her or a family members home she could stay with at discharge. Patient currently functioning at min - mod A. Eval limited secondary to temporary femoral cath which limits pt to bed level only for safety. Bed mobility performed with min A overall.  Patient will benefit from acute OT to increase overall independence in the areas of ADLs, functional mobility, and safety awareness in order to safely discharge to next venue of care.    Follow Up Recommendations  SNF    Equipment Recommendations  Other (comment) (defer to next venue of care)    Recommendations for Other Services Other (comment) (none at this time)     Precautions / Restrictions Precautions Precautions: Fall Restrictions Other Position/Activity Restrictions: R temp femoral cath placed      Mobility Bed Mobility Overal bed mobility: Needs Assistance Bed Mobility: Rolling Rolling: Min assist         General bed mobility comments: min cuing for hand placement and  increased time  Transfers      General transfer comment: deferred secondary to temp fem cath        ADL either performed or assessed with clinical judgement   ADL Overall ADL's : Needs assistance/impaired     Grooming: Wash/dry hands;Wash/dry face;Oral care;Set up;Bed level                  Vision Patient Visual Report: No change from baseline              Pertinent Vitals/Pain Pain Assessment: 0-10 Faces Pain Scale: Hurts little more Pain Location: generalized Pain Descriptors / Indicators: Discomfort;Aching Pain Intervention(s): Limited activity within patient's tolerance;Monitored during session;Repositioned     Hand Dominance Left   Extremity/Trunk Assessment Upper Extremity Assessment Upper Extremity Assessment: Generalized weakness   Lower Extremity Assessment Lower Extremity Assessment: Defer to PT evaluation   Cervical / Trunk Assessment Cervical / Trunk Assessment: Normal   Communication Communication Communication: No difficulties   Cognition Arousal/Alertness: Awake/alert Behavior During Therapy: Restless;Anxious;Impulsive Overall Cognitive Status: Impaired/Different from baseline Area of Impairment: Orientation;Attention;Memory;Following commands;Safety/judgement;Awareness;Problem solving      Orientation Level: Person;Place;Time;Situation Current Attention Level: Sustained Memory: Decreased recall of precautions Following Commands: Follows one step commands consistently Safety/Judgement: Decreased awareness of safety;Decreased awareness of deficits Awareness: Intellectual Problem Solving: Slow processing;Decreased initiation;Requires verbal cues General Comments: OT reviewed precautions with therapy and temp femoral cath. Pt still asking therapist to let her ambulate to sink to wash hair. Pt with decreased safety awareness with self care tasks. Pt rolling L <> R with min A and set up A for bed level for grooming and  UB self care tasks.               Home Living Family/patient expects to be discharged to:: Private residence Living Arrangements: Alone Available Help at Discharge: Family;Available PRN/intermittently Type of Home: Apartment Home Access: Level entry     Home Layout: One level     Bathroom Shower/Tub: Teacher, early years/pre: Standard Bathroom Accessibility: Yes   Home Equipment: Walker - 4 wheels;Cane - single point;Cane - quad;Wheelchair - manual;Toilet riser   Additional Comments: patient told me she drives, but per admission notes from 6 mo ago states she had a van or car that would take her to dialysis or shopping.      Prior Functioning/Environment Level of Independence: Independent with assistive device(s)        Comments: states she has family that lives close by but no one can stay with her        OT Problem List: Decreased strength;Decreased activity tolerance;Decreased safety awareness;Impaired balance (sitting and/or standing);Decreased cognition;Cardiopulmonary status limiting activity;Decreased knowledge of use of DME or AE      OT Treatment/Interventions: Self-care/ADL training;Therapeutic exercise;Therapeutic activities;Energy conservation;Patient/family education;DME and/or AE instruction;Balance training    OT Goals(Current goals can be found in the care plan section) Acute Rehab OT Goals Patient Stated Goal: to go home OT Goal Formulation: With patient Time For Goal Achievement: 07/02/20 Potential to Achieve Goals: Good ADL Goals Pt Will Perform Grooming: with supervision;standing Pt Will Transfer to Toilet: with supervision;bedside commode Pt Will Perform Toileting - Clothing Manipulation and hygiene: with supervision;sit to/from stand Pt Will Perform Tub/Shower Transfer: with supervision;shower seat  OT Frequency: Min 1X/week   Barriers to D/C: Decreased caregiver support             AM-PAC OT "6 Clicks" Daily Activity     Outcome Measure Help from  another person eating meals?: None Help from another person taking care of personal grooming?: A Little Help from another person toileting, which includes using toliet, bedpan, or urinal?: A Lot Help from another person bathing (including washing, rinsing, drying)?: A Lot Help from another person to put on and taking off regular upper body clothing?: A Little Help from another person to put on and taking off regular lower body clothing?: A Lot 6 Click Score: 16   End of Session    Activity Tolerance: Patient tolerated treatment well Patient left: in bed;with call bell/phone within reach;with bed alarm set  OT Visit Diagnosis: Muscle weakness (generalized) (M62.81)                Time: 2130-8657 OT Time Calculation (min): 23 min Charges:  OT General Charges $OT Visit: 1 Visit OT Evaluation $OT Eval Moderate Complexity: 1 Mod OT Treatments $Self Care/Home Management : 8-22 mins  Darleen Crocker, MS, OTR/L , CBIS ascom [phone]  06/18/20, 3:24 PM

## 2020-06-18 NOTE — Progress Notes (Signed)
Date of Admission:  06/02/2020      ID: [name] is a 59 y.o. female Principal Problem:   Bacteremia due to Enterococcus Active Problems:   Acute respiratory failure with hypoxia (HCC)   Anemia of chronic disease   Acute diastolic CHF (congestive heart failure) (HCC)   COPD (chronic obstructive pulmonary disease) (HCC)   ESRD on hemodialysis (HCC)   Benign essential HTN   Obesity (BMI 30-39.9) infected AV HERO graft rt arm- removed- enterococcus in culture  Subjective: Says she has left sided back pain  Son at bedside says she has throat pain Not eating   Medications:  . aspirin EC  81 mg Oral Daily  . atorvastatin  20 mg Oral Daily  . chlorhexidine gluconate (MEDLINE KIT)  15 mL Mouth Rinse BID  . Chlorhexidine Gluconate Cloth  6 each Topical Daily  . cholestyramine light  4 g Oral BID  . dextrose  1 ampule Intravenous Once  . epoetin (EPOGEN/PROCRIT) injection  10,000 Units Intravenous Q T,Th,Sa-HD  . feeding supplement (ENSURE ENLIVE)  237 mL Oral BID BM  . ferrous sulfate  325 mg Oral Daily  . gabapentin  100 mg Oral QPM  . heparin  5,000 Units Subcutaneous Q8H  . hydrOXYzine  25 mg Oral Daily  . lipase/protease/amylase  12,000 Units Oral TID WC  . multivitamin  1 tablet Oral Daily  . pantoprazole (PROTONIX) IV  40 mg Intravenous Q24H  . cyanocobalamin  1,000 mcg Oral Daily    Objective: Vital signs in last 24 hours: Temp:  [97.5 F (36.4 C)-98.1 F (36.7 C)] 98 F (36.7 C) (09/27 0802) Pulse Rate:  [65-86] 65 (09/27 0802) Resp:  [13-28] 19 (09/27 0802) BP: (148-174)/(56-86) 151/86 (09/27 0802) SpO2:  [90 %-100 %] 97 % (09/27 0802)  PHYSICAL EXAM:  General: Alert, pale, , chronically ill Head: Normocephalic, without obvious abnormality, atraumatic. Eyes: Conjunctivae clear, anicteric sclerae. Pupils are equal ENT Nares normal. No drainage or sinus tenderness. Lips, mucosa, and tongue normal. No Thrush Neck: Supple, symmetrical, no adenopathy, thyroid:  non tender no carotid bruit and no JVD. Back: No CVA tenderness. Lungs: Clear to auscultation bilaterally. No Wheezing or Rhonchi. No rales. Heart: Regular rate and rhythm, no murmur, rub or gallop. Abdomen: Soft, non-tender,not distended. Bowel sounds normal. No masses Extremities:rt arm swollen, ecchymosis. Surgical site no discharge Or erythema Skin: No rashes or lesions. Or bruising Lymph: Cervical, supraclavicular normal. Neurologic: Grossly non-focal  Lab Results Recent Labs    06/16/20 0545 06/16/20 0545 06/17/20 0620 06/17/20 0628 06/17/20 1957  WBC 13.9*  --   --  7.3  --   HGB 7.9*  --   --  7.4*  --   HCT 23.3*  --   --  21.9*  --   NA 135  --  136  --   --   K 3.1*   < > 2.9*  --  3.8  CL 96*  --  98  --   --   CO2 28  --  27  --   --   BUN 21  --  13  --   --   CREATININE 4.84*  --  3.40*  --   --    < > = values in this interval not displayed.   Liver Panel Recent Labs    06/16/20 0545 06/17/20 0620  ALBUMIN 2.2* 2.3*   Sedimentation Rate No results for input(s): ESRSEDRATE in the last 72 hours. C-Reactive Protein No  results for input(s): CRP in the last 72 hours.  Microbiology:  06/13/20 AV graft culture- enterococcus 9/16 BC X2 - NG 9/13 BC X 2 Enterococcus Studies/Results: No results found.   Assessment/Plan: Enterococcus bacteremiafrom the infected graft  -had removal of AV graft and Endarterectomy of theright brachial artery with Cormatrix patch on 06/13/20- leucocytosis has resolved  TEE no endocarditis repeat blood cultures Neg so far Currently on ampicillin( gent Dc on 06/15/20) AV graft culture was enterococcus Will need for a total of 4-6 weeks- can be given during dialysis on discharge ( likely vancomycin)  Bradycardia/hypotension following TEE on 06/11/20 Intubated, CT head and CT chest no acute findings. Extubated 06/15/20  congestive heart failure and pulmonary edemaVegetation not seen on TEE. 2 d echoshows moderate  tricuspid regurg  Anemia: received PRBC  Hypokalemia   Accelerated hypertension- management as per primary team  h/o DM gastroparesis with nausea/vomiting Pain abdomen yesterday- had CT abdomen which did not show any acute pathology  ESRD on dialysis   Thrombocytopenia-resolved   Discussed with her and her son

## 2020-06-18 NOTE — Progress Notes (Signed)
[facility], [place] 06/18/20  Subjective:   LOS: 15  Denies any acute complaints except midepigastric pain Very lethargic   Objective:  Vital signs in last 24 hours:  Temp:  [97.5 F (36.4 C)-98.2 F (36.8 C)] 98 F (36.7 C) (09/27 0802) Pulse Rate:  [65-137] 65 (09/27 0802) Resp:  [13-28] 19 (09/27 0802) BP: (148-174)/(56-86) 151/86 (09/27 0802) SpO2:  [90 %-100 %] 97 % (09/27 0802)  Weight change:  Filed Weights   06/13/20 0500 06/14/20 0500 06/15/20 0417  Weight: 76.3 kg 82.2 kg 81.4 kg    Intake/Output:    Intake/Output Summary (Last 24 hours) at 06/18/2020 1053 Last data filed at 06/17/2020 2247 Gross per 24 hour  Intake --  Output 0 ml  Net 0 ml     Physical Exam: General:  Chronically ill-appearing, laying in the bed  HEENT  moist oral mucous membranes  Pulm/lungs  decreased breath sounds at bases  CVS/Heart  no rub  Abdomen:   Soft, mild midepigastric tenderness  Extremities:  Trace edema  Neurologic:  Lethargic but able to answer few simple questions  Skin:  No acute rashes  Access:  Tem dialysis catheter in place        Basic Metabolic Panel:  Recent Labs  Lab 06/13/20 0532 06/13/20 0532 06/14/20 0726 06/14/20 0726 06/14/20 1648 06/14/20 1648 06/15/20 0505 06/16/20 0545 06/17/20 0620 06/17/20 1957  NA 136  138   < > 132*  --  137  --  136 135 136  --   K 3.4*  3.3*   < > 5.3*   < > 2.7*  --  3.0* 3.1* 2.9* 3.8  CL 97*  96*   < > 95*  --  95*  --  96* 96* 98  --   CO2 26  27   < > 20*  --  29  --  29 28 27   --   GLUCOSE 83  88   < > 109*  --  91  --  85 113* 105*  --   BUN 14  13   < > 20  --  8  --  13 21 13   --   CREATININE 4.39*  4.36*   < > 5.74*  --  2.25*  --  3.96* 4.84* 3.40*  --   CALCIUM 7.6*  7.9*   < > 7.1*   < > 7.4*   < > 7.0* 7.6* 8.0*  --   MG 1.8  --  1.6*  --   --   --  1.5* 1.7 1.8  --   PHOS 3.2   < > 5.3*  --  2.1*  --  3.2 4.3 2.8  --    < > = values in this interval not  displayed.     CBC: Recent Labs  Lab 06/14/20 0726 06/14/20 0726 06/14/20 1648 06/15/20 0505 06/15/20 1609 06/16/20 0545 06/17/20 0628  WBC 26.9*  --  31.8* 26.8*  --  13.9* 7.3  HGB 9.1*   < > 7.7* 6.4* 7.4* 7.9* 7.4*  HCT 26.1*   < > 21.6* 18.6* 22.1* 23.3* 21.9*  MCV 82.1  --  83.7 84.9  --  87.6 86.2  PLT 207  --  203 180  --  181 190   < > = values in this interval not displayed.      Lab Results  Component Value Date   HEPBSAG NON REACTIVE 04/14/2020   HEPBIGM  06/12/2010  NEGATIVE (NOTE) High levels of Hepatitis B Core IgM antibody are detectable during the acute stage of Hepatitis B. This antibody is used to differentiate current from past HBV infection.       Microbiology:  Recent Results (from the past 240 hour(s))  MRSA PCR Screening     Status: None   Collection Time: 06/11/20 10:40 AM   Specimen: Nasopharyngeal  Result Value Ref Range Status   MRSA by PCR NEGATIVE NEGATIVE Final    Comment:        The GeneXpert MRSA Assay (FDA approved for NASAL specimens only), is one component of a comprehensive MRSA colonization surveillance program. It is not intended to diagnose MRSA infection nor to guide or monitor treatment for MRSA infections. Performed at [facility], [address] [place], [place] [address]   Aerobic/Anaerobic Culture (surgical/deep wound)     Status: None   Collection Time: 06/13/20  2:23 PM   Specimen: Wound; Abscess  Result Value Ref Range Status   Specimen Description   Final    WOUND Performed at [facility], [address], [place], [place] [address]    Special Requests   Final    RIGHT ARM PERI GRAFT ABSCESS Performed at [facility], [place], [place] [address]    Gram Stain NO WBC SEEN NO ORGANISMS SEEN   Final   Culture   Final    RARE ENTEROCOCCUS FAECALIS NO ANAEROBES ISOLATED Performed at [facility], [place] [address], [place], [place] [address]     Report Status 06/18/2020 FINAL  Final   Organism ID, Bacteria ENTEROCOCCUS FAECALIS  Final      Susceptibility   Enterococcus faecalis - MIC*    AMPICILLIN <=2 SENSITIVE Sensitive     VANCOMYCIN 1 SENSITIVE Sensitive     GENTAMICIN SYNERGY SENSITIVE Sensitive     * RARE ENTEROCOCCUS FAECALIS    Coagulation Studies: No results for input(s): LABPROT, INR in the last 72 hours.  Urinalysis: No results for input(s): COLORURINE, LABSPEC, PHURINE, GLUCOSEU, HGBUR, BILIRUBINUR, KETONESUR, PROTEINUR, UROBILINOGEN, NITRITE, LEUKOCYTESUR in the last 72 hours.  Invalid input(s): APPERANCEUR    Imaging: CT ABDOMEN PELVIS WO CONTRAST  Result Date: 06/16/2020 CLINICAL DATA:  Severe abdominal pain. Suspicion for retroperitoneal hemorrhage. EXAM: CT ABDOMEN AND PELVIS WITHOUT CONTRAST TECHNIQUE: Multidetector CT imaging of the abdomen and pelvis was performed following the standard protocol without IV contrast. COMPARISON:  04/06/2020 FINDINGS: Lower chest: New small bilateral pleural effusions and bibasilar infiltrates noted. Hepatobiliary:  No mass visualized on this unenhanced exam. Pancreas: No mass or inflammatory process visualized on this unenhanced exam. Spleen:  Within normal limits in size. Adrenals/Urinary tract: Diffuse bilateral renal parenchymal atrophy, consistent with end-stage renal disease. No evidence of urolithiasis or hydronephrosis. Unremarkable unopacified urinary bladder. Stomach/Bowel: No evidence of obstruction, inflammatory process, or abnormal fluid collections. Colonic diverticulosis is again seen, without evidence of diverticulitis. Vascular/Lymphatic: No pathologically enlarged lymph nodes identified. No evidence of abdominal aortic aneurysm. Aortic atherosclerosis noted. Reproductive: Prior hysterectomy noted. Adnexal regions are unremarkable in appearance. Other: New moderate diffuse body wall edema and mild mesenteric edema. No evidence of retroperitoneal hemorrhage or ascites.  Musculoskeletal:  No suspicious bone lesions identified. IMPRESSION: New small bilateral pleural effusions, diffuse body wall edema, and mild mesenteric edema, consistent with 3rd spacing. New bibasilar atelectasis versus infiltrates. No evidence of retroperitoneal hemorrhage. Colonic diverticulosis, without radiographic evidence of diverticulitis. Aortic Atherosclerosis (ICD10-I70.0). Electronically Signed   By: Myles Rosenthal.D.  On: 06/16/2020 12:05     Medications:   . sodium chloride Stopped (06/17/20 0328)  . ampicillin (OMNIPEN) IV 2 g (06/18/20 0940)   . aspirin EC  81 mg Oral Daily  . atorvastatin  20 mg Oral Daily  . chlorhexidine gluconate (MEDLINE KIT)  15 mL Mouth Rinse BID  . Chlorhexidine Gluconate Cloth  6 each Topical Daily  . cholestyramine light  4 g Oral BID  . dextrose  1 ampule Intravenous Once  . epoetin (EPOGEN/PROCRIT) injection  10,000 Units Intravenous Q T,Th,Sa-HD  . feeding supplement (ENSURE ENLIVE)  237 mL Oral BID BM  . ferrous sulfate  325 mg Oral Daily  . gabapentin  100 mg Oral QPM  . heparin  5,000 Units Subcutaneous Q8H  . hydrOXYzine  25 mg Oral Daily  . lipase/protease/amylase  12,000 Units Oral TID WC  . multivitamin  1 tablet Oral Daily  . pantoprazole (PROTONIX) IV  40 mg Intravenous Q24H  . cyanocobalamin  1,000 mcg Oral Daily   sodium chloride, acetaminophen **OR** acetaminophen, calcium carbonate, camphor-menthol **AND** hydrOXYzine, haloperidol lactate, hydrALAZINE, ipratropium-albuterol, loperamide, naLOXone (NARCAN)  injection, ondansetron **OR** [DISCONTINUED] ondansetron (ZOFRAN) IV, oxyCODONE-acetaminophen, promethazine, sorbitol, zolpidem  Assessment/ Plan:  59 y.o. female was admitted on 06/02/2020 for  Principal Problem:   Bacteremia due to Enterococcus Active Problems:   Acute respiratory failure with hypoxia (HCC)   Anemia of chronic disease   Acute diastolic CHF (congestive heart failure) (HCC)   COPD (chronic obstructive  pulmonary disease) (HCC)   ESRD on hemodialysis (HCC)   Benign essential HTN   Obesity (BMI 30-39.9)  ESRD (end stage renal disease) ([place]) [N18.6] CAP (community acquired pneumonia) [J18.9] ESRD on dialysis ([place]) [N18.6, Z99.2] Dyspnea, unspecified type [R06.00] Acute on chronic congestive heart failure, unspecified heart failure type ([place]) [I50.9]  #. ESRD Plan for hemodialysis today Possibly PermCath tomorrow Maintain MWF schedule  #. Anemia of CKD  Lab Results  Component Value Date   HGB 7.4 (L) 06/17/2020   Low dose EPO with HD  #. Secondary hyperparathyroidism of renal origin N 25.81      Component Value Date/Time   PTH 357 (H) 10/05/2018 1241   Lab Results  Component Value Date   PHOS 2.8 06/17/2020   Monitor calcium and phos level during this admission   #. Diabetes type 2 with CKD Hemoglobin A1C (%)  Date Value  07/19/2013 9.3 (H)   Hgb A1c MFr Bld (%)  Date Value  05/03/2020 5.7 (H)    #Enterococcal bacteremia Currently on broad-spectrum antibiotics AV graft has been removed thought to be a potential source    LOS: [place] 9/27/202110:53 [place], [place]

## 2020-06-18 NOTE — Progress Notes (Signed)
[name] MRN: [id number] DOB: [birth year]   Cancelled Treatment:    Reason Eval/Treat Not Completed: Medical issues which prohibited therapy; Pt currently with a temp femoral catheter in place with mobility contraindicated at this time.  Will hold PT evaluation at this time and will attempt to see pt at a future date/time as medically appropriate.    Linus Salmons PT, DPT 06/18/20, 10:41 AM

## 2020-06-18 NOTE — Progress Notes (Signed)
SUBJECTIVE: Patient is comfortably sleeping    Vitals:   06/17/20 1650 06/17/20 1700 06/18/20 0400 06/18/20 0802  BP:   (!) 148/56 (!) 151/86  Pulse:    65  Resp: (!) 24 (!) 24 16 19   Temp:   (!) 97.5 F (36.4 C) 98 F (36.7 C)  TempSrc:   Oral Oral  SpO2:   100% 97%  Weight:      Height:        Intake/Output Summary (Last 24 hours) at 06/18/2020 1040 Last data filed at 06/17/2020 2247 Gross per 24 hour  Intake --  Output 0 ml  Net 0 ml    LABS: Basic Metabolic Panel: Recent Labs    06/16/20 0545 06/16/20 0545 06/17/20 0620 06/17/20 1957  NA 135  --  136  --   K 3.1*   < > 2.9* 3.8  CL 96*  --  98  --   CO2 28  --  27  --   GLUCOSE 113*  --  105*  --   BUN 21  --  13  --   CREATININE 4.84*  --  3.40*  --   CALCIUM 7.6*  --  8.0*  --   MG 1.7  --  1.8  --   PHOS 4.3  --  2.8  --    < > = values in this interval not displayed.   Liver Function Tests: Recent Labs    06/16/20 0545 06/17/20 0620  ALBUMIN 2.2* 2.3*   No results for input(s): LIPASE, AMYLASE in the last 72 hours. CBC: Recent Labs    06/16/20 0545 06/17/20 0628  WBC 13.9* 7.3  HGB 7.9* 7.4*  HCT 23.3* 21.9*  MCV 87.6 86.2  PLT 181 190   Cardiac Enzymes: No results for input(s): CKTOTAL, CKMB, CKMBINDEX, TROPONINI in the last 72 hours. BNP: Invalid input(s): POCBNP D-Dimer: No results for input(s): DDIMER in the last 72 hours. Hemoglobin A1C: No results for input(s): HGBA1C in the last 72 hours. Fasting Lipid Panel: No results for input(s): CHOL, HDL, LDLCALC, TRIG, CHOLHDL, LDLDIRECT in the last 72 hours. Thyroid Function Tests: No results for input(s): TSH, T4TOTAL, T3FREE, THYROIDAB in the last 72 hours.  Invalid input(s): FREET3 Anemia Panel: No results for input(s): VITAMINB12, FOLATE, FERRITIN, TIBC, IRON, RETICCTPCT in the last 72 hours.   PHYSICAL EXAM General: Well developed, well nourished, in no acute distress HEENT:  Normocephalic and atramatic Neck:  No JVD.   Lungs: Clear bilaterally to auscultation and percussion. Heart: HRRR . Normal S1 and S2 without gallops or murmurs.  Abdomen: Bowel sounds are positive, abdomen soft and non-tender  Msk:  Back normal, normal gait. Normal strength and tone for age. Extremities: No clubbing, cyanosis or edema.   Neuro: Alert and oriented X 3. Psych:  Good affect, responds appropriately  TELEMETRY: Sinus rhythm  ASSESSMENT AND PLAN: Enterococcus sepsis without vegetation and CHF and renal failure and HFpEF.  Patient is getting antibiotic and appears to be comfortable after being moved from ICU.  Principal Problem:   Bacteremia due to Enterococcus Active Problems:   Acute respiratory failure with hypoxia (HCC)   Anemia of chronic disease   Acute diastolic CHF (congestive heart failure) (HCC)   COPD (chronic obstructive pulmonary disease) (HCC)   ESRD on hemodialysis (HCC)   Benign essential HTN   Obesity (BMI 30-39.9)    Kearstyn Avitia A, MD, [facility] 06/18/2020 10:40 AM

## 2020-06-18 NOTE — Progress Notes (Signed)
Bedside report given to Dorann Lodge, RN who is assuming her care at this time.

## 2020-06-19 ENCOUNTER — Encounter
Admission: EM | Disposition: A | Discharge status: Skilled Nursing Facility | Payer: Self-pay | Source: Home / Self Care | Attending: Hospitalist

## 2020-06-19 ENCOUNTER — Inpatient Hospital Stay: Payer: Medicare HMO | Admitting: Anesthesiology

## 2020-06-19 ENCOUNTER — Other Ambulatory Visit (INDEPENDENT_AMBULATORY_CARE_PROVIDER_SITE_OTHER): Payer: Self-pay | Admitting: Vascular Surgery

## 2020-06-19 DIAGNOSIS — T82898A Other specified complication of vascular prosthetic devices, implants and grafts, initial encounter: Secondary | ICD-10-CM

## 2020-06-19 DIAGNOSIS — N186 End stage renal disease: Secondary | ICD-10-CM

## 2020-06-19 DIAGNOSIS — Z992 Dependence on renal dialysis: Secondary | ICD-10-CM

## 2020-06-19 DIAGNOSIS — I871 Compression of vein: Secondary | ICD-10-CM

## 2020-06-19 DIAGNOSIS — T80212A Local infection due to central venous catheter, initial encounter: Secondary | ICD-10-CM

## 2020-06-19 HISTORY — PX: DIALYSIS/PERMA CATHETER INSERTION: CATH118288

## 2020-06-19 LAB — RENAL FUNCTION PANEL
Albumin: 2.3 g/dL — ABNORMAL LOW (ref 3.5–5.0)
Anion gap: 13 (ref 5–15)
BUN: 16 mg/dL (ref 8–23)
CO2: 25 mmol/L (ref 22–32)
Calcium: 8.4 mg/dL — ABNORMAL LOW (ref 8.9–10.3)
Chloride: 97 mmol/L — ABNORMAL LOW (ref 98–111)
Creatinine, Ser: 3.92 mg/dL — ABNORMAL HIGH (ref 0.44–1.00)
GFR calc Af Amer: 14 mL/min — ABNORMAL LOW (ref >60)
GFR calc non Af Amer: 12 mL/min — ABNORMAL LOW (ref >60)
Glucose, Bld: 63 mg/dL — ABNORMAL LOW (ref 70–99)
Phosphorus: 3.1 mg/dL (ref 2.5–4.6)
Potassium: 3.9 mmol/L (ref 3.5–5.1)
Sodium: 135 mmol/L (ref 135–145)

## 2020-06-19 LAB — GLUCOSE, CAPILLARY
Glucose-Capillary: 66 mg/dL — ABNORMAL LOW (ref 70–99)
Glucose-Capillary: 68 mg/dL — ABNORMAL LOW (ref 70–99)
Glucose-Capillary: 75 mg/dL (ref 70–99)
Glucose-Capillary: 76 mg/dL (ref 70–99)
Glucose-Capillary: 83 mg/dL (ref 70–99)
Glucose-Capillary: 85 mg/dL (ref 70–99)

## 2020-06-19 LAB — MAGNESIUM: Magnesium: 2.1 mg/dL (ref 1.7–2.4)

## 2020-06-19 SURGERY — DIALYSIS/PERMA CATHETER INSERTION
Anesthesia: General

## 2020-06-19 MED ORDER — ETOMIDATE 2 MG/ML IV SOLN
INTRAVENOUS | Status: DC | PRN
  Administered 2020-06-19: 8 mg via INTRAVENOUS

## 2020-06-19 MED ORDER — METHYLPREDNISOLONE SODIUM SUCC 125 MG IJ SOLR: 125.0000 mg | Freq: Once | INTRAMUSCULAR | Status: DC | PRN

## 2020-06-19 MED ORDER — DIPHENHYDRAMINE HCL 50 MG/ML IJ SOLN: 50.0000 mg | Freq: Once | INTRAMUSCULAR | Status: DC | PRN

## 2020-06-19 MED ORDER — HYDRALAZINE HCL 25 MG PO TABS
25.0000 mg | ORAL_TABLET | Freq: Three times a day (TID) | ORAL | Status: DC
  Administered 2020-06-19 – 2020-06-24 (×11): 25 mg via ORAL
  Filled 2020-06-19 (×11): qty 1

## 2020-06-19 MED ORDER — CEFAZOLIN SODIUM-DEXTROSE 1-4 GM/50ML-% IV SOLN
1.0000 g | Freq: Once | INTRAVENOUS | Status: AC
  Administered 2020-06-19: 2 g via INTRAVENOUS

## 2020-06-19 MED ORDER — FENTANYL CITRATE (PF) 100 MCG/2ML IJ SOLN: 25.0000 ug | INTRAMUSCULAR | Status: DC | PRN

## 2020-06-19 MED ORDER — PROPOFOL 10 MG/ML IV BOLUS
INTRAVENOUS | Status: AC
  Filled 2020-06-19: qty 20

## 2020-06-19 MED ORDER — PROPOFOL 10 MG/ML IV BOLUS
INTRAVENOUS | Status: DC | PRN
  Administered 2020-06-19: 20 mg via INTRAVENOUS

## 2020-06-19 MED ORDER — EPHEDRINE SULFATE 50 MG/ML IJ SOLN
INTRAMUSCULAR | Status: DC | PRN
  Administered 2020-06-19 (×2): 5 mg via INTRAVENOUS
  Administered 2020-06-19: 10 mg via INTRAVENOUS

## 2020-06-19 MED ORDER — DEXTROSE 50 % IV SOLN: 1.0000 | Freq: Once | INTRAVENOUS | Status: AC

## 2020-06-19 MED ORDER — SODIUM CHLORIDE 0.9 % IV SOLN: INTRAVENOUS | Status: DC

## 2020-06-19 MED ORDER — LACTATED RINGERS IV SOLN: INTRAVENOUS | Status: DC | PRN

## 2020-06-19 MED ORDER — LIDOCAINE HCL (CARDIAC) PF 100 MG/5ML IV SOSY
PREFILLED_SYRINGE | INTRAVENOUS | Status: DC | PRN
  Administered 2020-06-19: 60 mg via INTRAVENOUS

## 2020-06-19 MED ORDER — HYDRALAZINE HCL 20 MG/ML IJ SOLN
10.0000 mg | INTRAMUSCULAR | Status: DC | PRN
  Administered 2020-06-19: 10 mg via INTRAVENOUS
  Filled 2020-06-19: qty 0.5

## 2020-06-19 MED ORDER — HYDROMORPHONE HCL 1 MG/ML IJ SOLN: 1.0000 mg | Freq: Once | INTRAMUSCULAR | Status: DC | PRN

## 2020-06-19 MED ORDER — HYDRALAZINE HCL 20 MG/ML IJ SOLN
INTRAMUSCULAR | Status: AC
  Filled 2020-06-19: qty 1

## 2020-06-19 MED ORDER — DEXTROSE 50 % IV SOLN
INTRAVENOUS | Status: AC
  Administered 2020-06-19: 50 mL via INTRAVENOUS
  Filled 2020-06-19: qty 50

## 2020-06-19 MED ORDER — HEPARIN SODIUM (PORCINE) 10000 UNIT/ML IJ SOLN
INTRAMUSCULAR | Status: AC
  Filled 2020-06-19: qty 1

## 2020-06-19 MED ORDER — FAMOTIDINE IN NACL 20-0.9 MG/50ML-% IV SOLN: 20.0000 mg | Freq: Once | INTRAVENOUS | Status: DC

## 2020-06-19 MED ORDER — GLYCOPYRROLATE 0.2 MG/ML IJ SOLN
INTRAMUSCULAR | Status: DC | PRN
  Administered 2020-06-19: .2 mg via INTRAVENOUS

## 2020-06-19 MED ORDER — MIDAZOLAM HCL 2 MG/ML PO SYRP: 8.0000 mg | ORAL_SOLUTION | Freq: Once | ORAL | Status: DC | PRN

## 2020-06-19 MED ORDER — IODIXANOL 320 MG/ML IV SOLN
INTRAVENOUS | Status: DC | PRN
  Administered 2020-06-19: 5 mL

## 2020-06-19 MED ORDER — METOCLOPRAMIDE HCL 5 MG/ML IJ SOLN
INTRAMUSCULAR | Status: AC
  Filled 2020-06-19: qty 2

## 2020-06-19 MED ORDER — FAMOTIDINE 20 MG PO TABS: 40.0000 mg | ORAL_TABLET | Freq: Once | ORAL | Status: DC | PRN

## 2020-06-19 SURGICAL SUPPLY — 13 items
BALLN ULTRVRSE 6X60X75C (BALLOONS) ×2
BALLOON ULTRVRSE 6X60X75C (BALLOONS) ×1 IMPLANT
CATH BEACON 5 .035 65 KMP TIP (CATHETERS) ×2 IMPLANT
CATH CANNON HEMO 15FR 23CM (HEMODIALYSIS SUPPLIES) ×2 IMPLANT
DEVICE TORQUE .025-.038 (MISCELLANEOUS) ×2 IMPLANT
GUIDEWIRE ANGLED .035 180CM (WIRE) ×2 IMPLANT
GUIDEWIRE SUPER STIFF .035X180 (WIRE) ×2 IMPLANT
KIT DIALYSIS CATH TRI 30X13 (CATHETERS) ×2 IMPLANT
KIT ENCORE 26 ADVANTAGE (KITS) ×2 IMPLANT
NEEDLE ENTRY 21GA 7CM ECHOTIP (NEEDLE) ×2 IMPLANT
PACK ANGIOGRAPHY (CUSTOM PROCEDURE TRAY) ×2 IMPLANT
SET INTRO CAPELLA COAXIAL (SET/KITS/TRAYS/PACK) ×2 IMPLANT
SHEATH BRITE TIP 6FR X 23 (SHEATH) ×2 IMPLANT

## 2020-06-19 NOTE — Transfer of Care (Signed)
Immediate Anesthesia Transfer of Care Note  Patient: [name]  Procedure(s) Performed: DIALYSIS/PERMA CATHETER INSERTION (N/A )  Patient Location: PACU  Anesthesia Type:General  Level of Consciousness: drowsy and patient cooperative  Airway & Oxygen Therapy: Patient connected to face mask oxygen  Post-op Assessment: Report given to RN and Post -op Vital signs reviewed and stable  Post vital signs: Reviewed and stable  Last Vitals:  Vitals Value Taken Time  BP 185/84 06/19/20 1644  Temp    Pulse 59 06/19/20 1647  Resp 11 06/19/20 1647  SpO2 100 % 06/19/20 1647  Vitals shown include unvalidated device data.  Last Pain:  Vitals:   06/19/20 1422  TempSrc: Oral  PainSc: 0-No pain      Patients Stated Pain Goal: 0 (44/69/50 7225)  Complications: No complications documented.

## 2020-06-19 NOTE — Progress Notes (Addendum)
PROGRESS NOTE    [name]   AJG:[id number]  DOB: 1961/03/14  PCP: [facility] [place]    DOA: 06/02/2020 LOS: 11   Brief Narrative   59 year old female with past medical history of ESRD on dialysis (MWF), dCHF, anemia, CAD, renal cell cancer, pancreatitis, anemia, hypertension, diabetes mellitus, COPD, asthma, depression, anxiety, who was admitted on 06/02/2020 with fever/chills, shortness of breath.  Admitted with pneumonia and volume overloaded after missing dialysis.  BP uncontrolled on presentation with systolic BP in 355'H.  She underwent dialysis the evening of admission.   Patient apparently refused blood draw for initial blood cultures, these were eventually obtained on 9/13 and returned positive for Enterococcus faecalis which is thought secondary to AV graft infection with abscess.  ID was consulted.   A temporary R femoral dialysis catheter was placed on 9/18.  TEE done on 9/20 was negative for endocarditis.  During the procedure, patient became hypotensive (systolic 74'B) and bradycardic (HR 20's), apparently was a near cardiac arrest, and was in acute severe respiratory failure.  Intubated and transferred to ICU, required vasopressors.   9/22 Vascular surgery removed the infected RUE AV graft and performed endarterectomy of the R brachial artery with Cormatrix patch.  Required 2 units pRBC transfusion due to blood loss.  9/23 patient successfully extubated.  Remained confused, but encephalopathy improving.  9/25 severe abdominal pain, concern for retroperitoneal hemorrhage which was ruled out by CT abdomen/pelvis.  Family reported patient's pain from gastroparesis presents in similar fashion.  Patient transferred out of ICU later on 9/25 to Progressive and TRH assumed care of patient on 9/26.    Assessment & Plan   Principal Problem:   Bacteremia due to Enterococcus Active Problems:   Acute respiratory failure with hypoxia (HCC)   Anemia of chronic  disease   Acute diastolic CHF (congestive heart failure) (HCC)   COPD (chronic obstructive pulmonary disease) (HCC)   ESRD on hemodialysis (HCC)   Benign essential HTN   Obesity (BMI 30-39.9)    Severe sepsis secondary to Enterococcus faecalis bacteremia  Source appears to have been infected AV hero graft which was removed 9/22. ID is following.   Was on Ampicillin + Gentamicin, currently on Ampicillin monotherapy. Repeat blood cultures are negative to date - follow. Will need total 4-6 weeks IV antibiotics (likely vanc at dialysis)   Acute Hypoxic  Respiratory Failure - Resolved - extubated 9/23, on room air Hypotension off vasopressors    Acute on chronic diastolic CHF / Volume Overload - volume being managed with dialysis and has improved.  Now appears euvolemic.     Acute toxic/metabolic encephalopathy - appears was mostly due to sedating medications.  Mentation improving. --Delirium precautions:     -Lights and TV off, minimize interruptions at night    -Blinds open and lights on during day    -Glasses/hearing aid with patient    -Frequent reorientation    -PT/OT when able    -Avoid sedation medications/Beers list medications   Generalized weakness - OT recommending SNF.  PT cannot mobilize patient until femoral line removed.    Type 2 Diabetes with gastroparesis - Hbg A1c 5.7 in 04/2020, well controlled.  Not on any insulin coverage and normal/low CBG's.  Monitor.    ESRD on dialysis - M/W/F dialysis.  Nephrology following. --Likely new Permcath to be placed today or tomorrow  Acute blood loss anemia superimposed on anemia of chronic renal disease - pt required blood transfusions after AV graft revision, s/p  2 units pRBCs 9/23 and 9/24.  Monitor CBC.  On Epo.  Monitor for bleeding.  Hbg currently at/near baseline.  COPD - not acutely exacerbated.  Continue PRN albuterol.  Hypertension - on Coreg, PRN hydralazine.  Started PO hydralazine 25 mg TID.  Home Lasix on  hold for now.  Obesity: Body mass index is 29.86 kg/m.  Complicates overall care and prognosis.     DVT prophylaxis: heparin injection 5,000 Units Start: 06/02/20 2300   Diet:  Diet Orders (From admission, onward)    Start     Ordered   06/19/20 0942  Diet NPO time specified  Diet effective now        06/19/20 0941            Code Status: Full Code    Subjective 06/19/20    Patient seen on rounds this AM with nurse at bedside. She reports being in pain, mostly her back and abdomen.  Denies nausea/vomiting.  Really wants to be able to get out of bed.     Disposition Plan & Communication   Status is: Inpatient  Remains inpatient appropriate because:Inpatient level of care appropriate due to severity of illness.  Therapy evaluations pending, needs new PermCath placed.    Dispo: The patient is from: Home              Anticipated d/c is to: Home              Anticipated d/c date is: 2 days              Patient currently is not medically stable to d/c.    Family Communication: none at bedside, will attempt to call.    Consults, Procedures, Significant Events   Consultants:   Nephrology  PCCM  Vascular surgery  Cardiology  Procedures:   9/18 temporary femoral dialysis cath placed  9/20 TEE, intubated  9/22 removal of AV graft & R brachial endarterectomy  Dialysis  Antimicrobials:  Anti-infectives (From admission, onward)   Start     Dose/Rate Route Frequency Ordered Stop   06/16/20 1200  gentamicin (GARAMYCIN) IVPB 80 mg  Status:  Discontinued        80 mg 100 mL/hr over 30 Minutes Intravenous Every T-Th-Sa (Hemodialysis) 06/15/20 0803 06/15/20 1350   06/15/20 1200  gentamicin (GARAMYCIN) IVPB 80 mg  Status:  Discontinued        80 mg 100 mL/hr over 30 Minutes Intravenous Every M-W-F (Hemodialysis) 06/13/20 1404 06/15/20 0803   06/14/20 1800  gentamicin (GARAMYCIN) 60 mg in dextrose 5 % 50 mL IVPB        60 mg 103 mL/hr over 30 Minutes  Intravenous  Once 06/14/20 1643 06/14/20 1819   06/13/20 1800  gentamicin (GARAMYCIN) IVPB 80 mg  Status:  Discontinued        80 mg 100 mL/hr over 30 Minutes Intravenous Every M-W-F (Hemodialysis) 06/12/20 1402 06/13/20 1404   06/13/20 0600  vancomycin (VANCOCIN) IVPB 1000 mg/200 mL premix        1,000 mg 200 mL/hr over 60 Minutes Intravenous On call to O.R. 06/13/20 0114 06/13/20 1350   06/12/20 1600  gentamicin (GARAMYCIN) IVPB 60 mg  Status:  Discontinued        60 mg 100 mL/hr over 30 Minutes Intravenous  Once 06/12/20 1402 06/12/20 1412   06/12/20 1600  gentamicin (GARAMYCIN) 60 mg in dextrose 5 % 50 mL IVPB        60 mg 103 mL/hr over  30 Minutes Intravenous  Once 06/12/20 1412 06/12/20 2105   06/11/20 1200  gentamicin (GARAMYCIN) IVPB 80 mg  Status:  Discontinued        80 mg 100 mL/hr over 30 Minutes Intravenous Every M-W-F (Hemodialysis) 06/08/20 1356 06/12/20 1402   06/10/20 1900  gentamicin (GARAMYCIN) IVPB 80 mg        80 mg 100 mL/hr over 30 Minutes Intravenous  Once 06/10/20 1722 06/11/20 0056   06/08/20 1800  gentamicin (GARAMYCIN) IVPB 100 mg  Status:  Discontinued        100 mg 200 mL/hr over 30 Minutes Intravenous  Once 06/08/20 1356 06/08/20 1425   06/08/20 1800  gentamicin (GARAMYCIN) IVPB 100 mg  Status:  Discontinued        100 mg 200 mL/hr over 30 Minutes Intravenous  Once 06/08/20 1425 06/08/20 1430   06/08/20 1800  gentamicin (GARAMYCIN) IVPB 100 mg  Status:  Discontinued        100 mg 200 mL/hr over 30 Minutes Intravenous  Once 06/08/20 1430 06/08/20 1441   06/08/20 1800  gentamicin (GARAMYCIN) 100 mg in dextrose 5 % 50 mL IVPB        100 mg 105 mL/hr over 30 Minutes Intravenous  Once 06/08/20 1441 06/08/20 1927   06/08/20 1500  ampicillin (OMNIPEN) 2 g in sodium chloride 0.9 % 100 mL IVPB        2 g 300 mL/hr over 20 Minutes Intravenous Every 12 hours 06/08/20 1356     06/07/20 2200  piperacillin-tazobactam (ZOSYN) IVPB 2.25 g  Status:  Discontinued         2.25 g 100 mL/hr over 30 Minutes Intravenous Every 8 hours 06/07/20 1657 06/08/20 1010   06/07/20 1400  ampicillin (OMNIPEN) 2 g in sodium chloride 0.9 % 100 mL IVPB  Status:  Discontinued        2 g 300 mL/hr over 20 Minutes Intravenous Every 8 hours 06/07/20 1025 06/07/20 1637   06/06/20 1600  piperacillin-tazobactam (ZOSYN) IVPB 2.25 g  Status:  Discontinued        2.25 g 100 mL/hr over 30 Minutes Intravenous Every 8 hours 06/06/20 1403 06/07/20 1025   06/03/20 1400  azithromycin (ZITHROMAX) 500 mg in sodium chloride 0.9 % 250 mL IVPB  Status:  Discontinued        500 mg 250 mL/hr over 60 Minutes Intravenous Every 24 hours 06/03/20 1322 06/06/20 1319   06/03/20 1400  cefTRIAXone (ROCEPHIN) 1 g in sodium chloride 0.9 % 100 mL IVPB  Status:  Discontinued        1 g 200 mL/hr over 30 Minutes Intravenous Every 24 hours 06/03/20 1322 06/06/20 1319       Significant Events: From Dr. Patsey Berthold note on 9/25: "9/20 TEE performed Tee revealed no evidence of endocartidits. Procedure complicated by hypotension and bradycaradia with heart rates dropping to mid 20's and blood pressure systolic to 41L. Received 1 amp of atropine, amp of epinephrine and neosynephrine. Pt intubated to protect air way. Stat head ct and chest ct were unremarkable. Pt son notified immediately and has been updated with care. 9/21 remains on vent, plan for SAT/SBT 9/22 plan for OR today  MRI BRAIN-IMPRESSION: 1. No acute intracranial abnormality. 2. Few small remote lacunar infarcts involving the pons. 3. Otherwise normal brain MRI for age. 9/23 HD performed patient extubated after successful SAT/SBT 9/24 uneventful day, still somewhat encephalopathic but clearing"  Objective   Vitals:   06/18/20 2100 06/18/20 2146 06/19/20 2440  06/19/20 1126  BP: (!) 169/57 (!) 101/56 (!) 169/59 (!) 180/79  Pulse: 62 62 61 (!) 58  Resp: 17 14 16    Temp:  98.1 F (36.7 C) 98.6 F (37 C) 97.7 F (36.5 C)  TempSrc:  Axillary  Oral   SpO2: 98% 95% 96% 99%  Weight:      Height:        Intake/Output Summary (Last 24 hours) at 06/19/2020 1206 Last data filed at 06/19/2020 0329 Gross per 24 hour  Intake 300.99 ml  Output 500 ml  Net -199.01 ml   Filed Weights   06/13/20 0500 06/14/20 0500 06/15/20 0417  Weight: 76.3 kg 82.2 kg 81.4 kg    Physical Exam:  General exam: awake, alert, no acute distress  Respiratory system: CTAB, normal respiratory effort. Cardiovascular system: normal S1/S2, RRR, no pedal edema.   Gastrointestinal system: left-sided tenderness on palpation, no rebound tenderness, ND Central nervous system: no gross focal neurologic deficits, normal speech Extremities: RUE AV fistula site with clean dressing intact, no surrounding warmth induration or erythema Psychiatry: depressed mood, congruent affect  Labs   Data Reviewed: I have personally reviewed following labs and imaging studies  CBC: Recent Labs  Lab 06/14/20 1648 06/14/20 1648 06/15/20 0505 06/15/20 1609 06/16/20 0545 06/17/20 0628 06/18/20 1726  WBC 31.8*  --  26.8*  --  13.9* 7.3 6.2  HGB 7.7*   < > 6.4* 7.4* 7.9* 7.4* 8.2*  HCT 21.6*   < > 18.6* 22.1* 23.3* 21.9* 25.3*  MCV 83.7  --  84.9  --  87.6 86.2 88.2  PLT 203  --  180  --  181 190 216   < > = values in this interval not displayed.   Basic Metabolic Panel: Recent Labs  Lab 06/15/20 0505 06/15/20 0505 06/16/20 0545 06/17/20 0620 06/17/20 1957 06/18/20 1726 06/19/20 0707  NA 136  --  135 136  --  134*  135 135  K 3.0*   < > 3.1* 2.9* 3.8 3.9  4.0 3.9  CL 96*  --  96* 98  --  96*  96* 97*  CO2 29  --  28 27  --  26  27 25   GLUCOSE 85  --  113* 105*  --  92  95 63*  BUN 13  --  21 13  --  22  23 16   CREATININE 3.96*  --  4.84* 3.40*  --  4.77*  4.83* 3.92*  CALCIUM 7.0*  --  7.6* 8.0*  --  8.4*  8.5* 8.4*  MG 1.5*  --  1.7 1.8  --  1.9 2.1  PHOS 3.2  --  4.3 2.8  --  3.5 3.1   < > = values in this interval not displayed.   GFR: Estimated  Creatinine Clearance: 15.9 mL/min (A) (by C-G formula based on SCr of 3.92 mg/dL (H)). Liver Function Tests: Recent Labs  Lab 06/15/20 0505 06/16/20 0545 06/17/20 0620 06/18/20 1726 06/19/20 0707  ALBUMIN 2.0* 2.2* 2.3* 2.3* 2.3*   No results for input(s): LIPASE, AMYLASE in the last 168 hours. No results for input(s): AMMONIA in the last 168 hours. Coagulation Profile: Recent Labs  Lab 06/13/20 0532  INR 1.3*   Cardiac Enzymes: No results for input(s): CKTOTAL, CKMB, CKMBINDEX, TROPONINI in the last 168 hours. BNP (last 3 results) No results for input(s): PROBNP in the last 8760 hours. HbA1C: No results for input(s): HGBA1C in the last 72 hours. CBG: Recent Labs  Lab 06/18/20 2150 06/19/20 0048 06/19/20 0513 06/19/20 0801 06/19/20 1124  GLUCAP 90 83 75 85 76   Lipid Profile: No results for input(s): CHOL, HDL, LDLCALC, TRIG, CHOLHDL, LDLDIRECT in the last 72 hours. Thyroid Function Tests: No results for input(s): TSH, T4TOTAL, FREET4, T3FREE, THYROIDAB in the last 72 hours. Anemia Panel: No results for input(s): VITAMINB12, FOLATE, FERRITIN, TIBC, IRON, RETICCTPCT in the last 72 hours. Sepsis Labs: No results for input(s): PROCALCITON, LATICACIDVEN in the last 168 hours.  Recent Results (from the past 240 hour(s))  MRSA PCR Screening     Status: None   Collection Time: 06/11/20 10:40 AM   Specimen: Nasopharyngeal  Result Value Ref Range Status   MRSA by PCR NEGATIVE NEGATIVE Final    Comment:        The GeneXpert MRSA Assay (FDA approved for NASAL specimens only), is one component of a comprehensive MRSA colonization surveillance program. It is not intended to diagnose MRSA infection nor to guide or monitor treatment for MRSA infections. Performed at [facility], [address] [place], [place] [address]   Aerobic/Anaerobic Culture (surgical/deep wound)     Status: None   Collection Time: 06/13/20  2:23 PM   Specimen: Wound; Abscess   Result Value Ref Range Status   Specimen Description   Final    WOUND Performed at [facility], [address], [place], [place] [address]    Special Requests   Final    RIGHT ARM PERI GRAFT ABSCESS Performed at [facility] [place], [place] [address]    Gram Stain NO WBC SEEN NO ORGANISMS SEEN   Final   Culture   Final    RARE ENTEROCOCCUS FAECALIS NO ANAEROBES ISOLATED Performed at [facility], [place] [address], [place], [place] [address]    Report Status 06/18/2020 FINAL  Final   Organism ID, Bacteria ENTEROCOCCUS FAECALIS  Final      Susceptibility   Enterococcus faecalis - MIC*    AMPICILLIN <=2 SENSITIVE Sensitive     VANCOMYCIN 1 SENSITIVE Sensitive     GENTAMICIN SYNERGY SENSITIVE Sensitive     * RARE ENTEROCOCCUS FAECALIS      Imaging Studies   No results found.   Medications   Scheduled Meds: . aspirin EC  81 mg Oral Daily  . atorvastatin  20 mg Oral Daily  . carvedilol  6.25 mg Oral BID WC  . chlorhexidine gluconate (MEDLINE KIT)  15 mL Mouth Rinse BID  . Chlorhexidine Gluconate Cloth  6 each Topical Daily  . cholestyramine light  4 g Oral BID  . dextrose  1 ampule Intravenous Once  . [START ON 06/20/2020] epoetin (EPOGEN/PROCRIT) injection  10,000 Units Intravenous Q M,W,F-HD  . feeding supplement (ENSURE ENLIVE)  237 mL Oral BID BM  . ferrous sulfate  325 mg Oral Daily  . gabapentin  100 mg Oral QPM  . heparin  5,000 Units Subcutaneous Q8H  . hydrOXYzine  25 mg Oral Daily  . lipase/protease/amylase  12,000 Units Oral TID WC  . multivitamin  1 tablet Oral Daily  . pantoprazole (PROTONIX) IV  40 mg Intravenous Q24H  . cyanocobalamin  1,000 mcg Oral Daily   Continuous Infusions: . sodium chloride Stopped (06/17/20 0328)  . ampicillin (OMNIPEN) IV Stopped (06/18/20 2219)       LOS: 16 days    Time spent: 25 minutes with > 50% spent in coordination of care and direct patient contact    Claiborne Billings  A  Arbutus Ped, DO [facility]  06/19/2020, 12:06 PM    If 7PM-7AM, please contact night-coverage. How to contact the [facility] Attending or Consulting provider [place] or covering provider during after hours [place], for this patient?    1. Check the care team in [facility] and look for a) attending/consulting [facility] provider listed and b) the [facility] team listed 2. Log into [email or web address] and use [place]'s universal password to access. If you do not have the password, please contact the hospital operator. 3. Locate the [facility] provider you are looking for under [facility] and page to a number that you can be directly reached. 4. If you still have difficulty reaching the provider, please page the [facility] (Director on Call) for the Hospitalists listed on amion for assistance.

## 2020-06-19 NOTE — Progress Notes (Signed)
Spoke with son [name] by phone to give him an update.  I described that we were able to successfully get a tunneled catheter in the shoulder chest area.  I discussed the relationship between the previously placed venous stents and the catheter.  We also discussed discharge and the fact that she will most certainly need inpatient rehab for which she has been resistant.  However, her son is much more realistic about her overall condition and the fact that she would not do well going straight home.

## 2020-06-19 NOTE — Progress Notes (Signed)
ID Pt tired Says she is cold Had permacath left chest  BP (!) 147/79 (BP Location: Left Wrist)   Pulse 65   Temp 97.7 F (36.5 C)   Resp 16   Ht 5\' 5"  (1.651 m)   Wt 81.4 kg   SpO2 100%   BMI 29.86 kg/m    lethargic Chronically ill Pale Chest b/l air entry HS bradycardia And soft Rt arm surgical site- some bruising /ecchymosis Left permacath  Labs CBC Latest Ref Rng & Units 06/18/2020 06/17/2020 06/16/2020  WBC 4.0 - 10.5 K/uL 6.2 7.3 13.9(H)  Hemoglobin 12.0 - 15.0 g/dL 8.2(L) 7.4(L) 7.9(L)  Hematocrit 36 - 46 % 25.3(L) 21.9(L) 23.3(L)  Platelets 150 - 400 K/uL 216 190 181    , CMP Latest Ref Rng & Units 06/19/2020 06/18/2020 06/18/2020  Glucose 70 - 99 mg/dL 63(L) 92 95  BUN 8 - 23 mg/dL 16 22 23   Creatinine 0.44 - 1.00 mg/dL 3.92(H) 4.77(H) 4.83(H)  Sodium 135 - 145 mmol/L 135 134(L) 135  Potassium 3.5 - 5.1 mmol/L 3.9 3.9 4.0  Chloride 98 - 111 mmol/L 97(L) 96(L) 96(L)  CO2 22 - 32 mmol/L 25 26 27   Calcium 8.9 - 10.3 mg/dL 8.4(L) 8.4(L) 8.5(L)  Total Protein 6.5 - 8.1 g/dL - - -  Total Bilirubin 0.3 - 1.2 mg/dL - - -  Alkaline Phos 38 - 126 U/L - - -  AST 15 - 41 U/L - - -  ALT 0 - 44 U/L - - -    Micro 06/05/20 BC -enterococcus 06/07/20- BC NG 06/13/20 graft culture enterococcus   Impression/recommendation  Enterococcus bacteremia secondary to infected graft which has been removed TEE neg for endocarditis Being treated as endovascular infection. On ampicillin Would start vancomycin from tomorrow post dialysis as that is what she will be discharged to complete 4 weeks 07/19/20  ESRD - has a new permacath .  Discussed the management with care team

## 2020-06-19 NOTE — Progress Notes (Signed)
   06/19/20 1345  Clinical Encounter Type  Visited With Patient;Health care provider  Visit Type Follow-up  Referral From Chaplain  Consult/Referral To [facility] responded to a pg for AD. When chaplain arrived, staff was preparing to take pt for a test. Chaplain explained an AD will take pressure off of her son and it will let everyone know what she wants. After leaving the room, the nurse asked if a chaplain will be available to talk with pt and her son. Chaplain told nurse to page chaplain when pt's son arrives.

## 2020-06-19 NOTE — TOC Progression Note (Signed)
Transition of Care [facility]) - Progression Note    Patient Details  Name: [name] MRN: [id number] Date of Birth: 08-03-1961  Transition of Care [facility]) CM/SW Contact  Meriel Flavors, LCSW Phone Number: 06/19/2020, 4:51 PM  Clinical Narrative:    CSW met with patient at bedside to discuss discharge plan. CSW explained roll and ask patient about going to SNF as recommended. Patient became slightly irritated as she refused going to a "Nursing Home". CSW confirmed it is her choice to refuse and ask about HH/PT services. Patient states she prefers [facility] for HH/PT. Patient did not remember any DME provider and no preference.         Expected Discharge Plan and Services                                                 Social Determinants of Health (SDOH) Interventions    Readmission Risk Interventions Readmission Risk Prevention Plan 06/06/2020 05/03/2020  Transportation Screening Complete Complete  Medication Review Press photographer) Complete Complete  PCP or Specialist appointment within 3-5 days of discharge Complete Complete  HRI or Home Care Consult Complete -  SW Recovery Care/Counseling Consult - Complete  Palliative Care Screening Not Applicable Not [place] Not Applicable Not Applicable  Some recent data might be hidden

## 2020-06-19 NOTE — Op Note (Signed)
OPERATIVE NOTE   PROCEDURE: 1. Insertion of tunneled dialysis catheter left supraclavicular collateral venous approach with ultrasound and fluoroscopic guidance. 2. Percutaneous angioplasty left innominate vein to 8 mm 3. Exchange temporary dialysis catheter same venous access right groin.  PRE-OPERATIVE DIAGNOSIS: Complication of vascular access with Enterococcus sepsis; superior vena cava syndrome; lack of appropriate IV access; end-stage renal disease requiring hemodialysis  POST-OPERATIVE DIAGNOSIS: Same  SURGEON: [name].  ANESTHESIA: General anesthesia  ESTIMATED BLOOD LOSS: Minimal cc  CONTRAST USED:  None  FLUOROSCOPY TIME: 5.6 minutes  INDICATIONS:   [name] a 59 y.o. y.o. female who presents with a very complex course.  Patient is end-stage renal and is now requiring more permanent dialysis access however she has multiple complicating factors.  She presented on this admission with Enterococcus sepsis that was secondary to a right arm hero graft.  She is already had a right groin tunneled catheter infection as well.  She has had multiple left-arm access these and is required multiple interventions.  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  She has undergone excision of her infected graft last week and is now being brought down for placement of a tunneled catheter in preparation for discharge.  Risks and benefits have been reviewed all questions answered patient has agreed to proceed and is stated quite emphatically that she wants the catheter in her neck chest area.  DESCRIPTION: After obtaining full informed written consent, the patient was positioned supine.  In the right groin was prepped and draped in a sterile fashion.  Nylon sutures securing the temporary catheter were removed.  A an Amplatz Super Stiff wire was then advanced through the catheter and the catheter was removed.  A new  tri-Allises catheter was then advanced over the wire without difficulty and the wire removed.  All 3 lumen aspirated flushed easily.  Catheter secured to the skin of the thigh with nylon suture in a sterile dressing including a Biopatch was applied.  Having secured intravenous access the left EJ IV was then removed from her neck.  After holding pressure preparations were made for evaluating the left neck and chest wall for tunnel catheter access.  The left neck and chest wall was prepped and draped in a sterile fashion. Ultrasound was placed in a sterile sleeve. Ultrasound was utilized to identify the internal jugular and subclavian veins both of which appear to be occluded.  Multiple fairly sizable collateral veins are noted which is echolucent and compressible indicating patency. Image is recorded for the permanent record. Under direct ultrasound visualization a micro-needle is inserted into the vein followed by advancing the micro-wire.  This was performed under fluoroscopic guidance and the wire was noted to cross the subclavian stent quite easily and then coursed down a mammary vein.  Micro-sheath was then advanced and hand-injection of contrast was performed which demonstrated occlusion of the innominate vein just proximal to the mammary vein.  The previous placed stents extend from the distal subclavian through the innominate vein.  Multiple oblique views both in the left right as well as craniocaudal views were performed and it appears that the wires coursing around the outside of the stent and this was later repeated with the sheath in place at which point it appears that the sheath is pushing the stent to the side so that the stent is between the wall and the outside of the sheath.  Given this finding and  the dire need for access in this significantly compromised patient who has suffered through multiple infections of several different access these I felt that proceeding in this fashion was worth it.  A  glide wire is inserted without difficulty under fluoroscopic guidance and negotiated down into the inferior vena cava.  I followed this with a Kumpe catheter and then exchanged the glide catheter for a 23 cm six French sheath.  The neck insertion site was then incised with an 11 blade scalpel to make a small counterincision and a hemostat was used to create a small pocket.  It was this point with the six [place] sheath inserted that I again repeated the multiple views.  Next I advanced an 8 mm x 60 mm Dorado balloon across the occluded innominate segment and angioplasty was performed to 16 atm for 1 minute.  A 23 cm tip to cuff Cannon catheter was then selected.  Under fluoroscopic guidance the first dilator was advanced over the wire and then under continuous fluoroscopy the dilator peel-away sheath was advanced advanced.  Again the stent appeared to be pushed to the side and the sheath went without significant difficulty.  The Cannon catheter was then advanced over the Amplatz wire through the peel-away sheath.   Under fluoroscopy the catheter tip positioned at the atrial caval junction. The catheter is then approximated to the left chest wall and an exit site selected. 1% lidocaine is infiltrated in soft tissues at this level small incision is made and the tunneling device is then passed from the exit site to the left neck counterincision. Catheter is then connected to the tunneling device and the catheter was pulled subcutaneously. It is then transected and the hub assembly connected without difficulty. Both lumens aspirate and flush easily. After verification of smooth contour with proper tip position under fluoroscopy the catheter is packed with 5000 units of heparin per lumen.  Catheter secured to the skin of the left chest wall with 0 silk. A sterile dressing is applied with a Biopatch.  COMPLICATIONS: None  CONDITION: Good  [name] [facility]. Office:  [phone]    06/19/2020,4:52 PM

## 2020-06-19 NOTE — Progress Notes (Signed)
OT Cancellation Note  Patient Details Name: [name] MRN: [id number] DOB: Mar 29, 1961   Cancelled Treatment:    Reason Eval/Treat Not Completed: Patient at procedure or test/ unavailable. Pt out of room at this time. Will follow up as pt is available and medically appropriate.   Jeni Salles, MPH, MS, OTR/L ascom [phone] 06/19/20, 4:07 PM

## 2020-06-19 NOTE — Progress Notes (Addendum)
[facility], [place] 06/19/20  Subjective:   LOS: 16  Patient resting in bed, awake and alert. She is getting O2 2L via nasal canula, in no acute respiratory distress. C/o mild mid epigastric pain   Objective:  Vital signs in last 24 hours:  Temp:  [97.7 F (36.5 C)-98.6 F (37 C)] 97.7 F (36.5 C) (09/28 1126) Pulse Rate:  [58-67] 58 (09/28 1126) Resp:  [0-18] 16 (09/28 0615) BP: (101-200)/(56-168) 180/79 (09/28 1126) SpO2:  [95 %-100 %] 99 % (09/28 1126)  Weight change:  Filed Weights   06/13/20 0500 06/14/20 0500 06/15/20 0417  Weight: 76.3 kg 82.2 kg 81.4 kg    Intake/Output:    Intake/Output Summary (Last 24 hours) at 06/19/2020 1338 Last data filed at 06/19/2020 0329 Gross per 24 hour  Intake 300.99 ml  Output 500 ml  Net -199.01 ml     Physical Exam: General:  Lying in bed, in no acute distress  HEENT  moist oral mucous membranes,Atkins in place  Pulm/lungs  Normal and symmetrical respirations,lungs with fine crackles at the bases  CVS/Heart  S1S2 no rub or gallop  Abdomen:   Soft, midepigastric tenderness +  Extremities:  Trace edema  Neurologic:  Alert, oriented x3  Skin:  No acute rashes  Access:  Temp dialysis catheter in place        Basic Metabolic Panel:  Recent Labs  Lab 06/15/20 0505 06/15/20 0505 06/16/20 0545 06/16/20 0545 06/17/20 0620 06/17/20 1957 06/18/20 1726 06/19/20 0707  NA 136  --  135  --  136  --  134*  135 135  K 3.0*   < > 3.1*  --  2.9* 3.8 3.9  4.0 3.9  CL 96*  --  96*  --  98  --  96*  96* 97*  CO2 29  --  28  --  27  --  26  27 25   GLUCOSE 85  --  113*  --  105*  --  92  95 63*  BUN 13  --  21  --  13  --  22  23 16   CREATININE 3.96*  --  4.84*  --  3.40*  --  4.77*  4.83* 3.92*  CALCIUM 7.0*   < > 7.6*   < > 8.0*  --  8.4*  8.5* 8.4*  MG 1.5*  --  1.7  --  1.8  --  1.9 2.1  PHOS 3.2  --  4.3  --  2.8  --  3.5 3.1   < > = values in this interval not displayed.      CBC: Recent Labs  Lab 06/14/20 1648 06/14/20 1648 06/15/20 0505 06/15/20 1609 06/16/20 0545 06/17/20 0628 06/18/20 1726  WBC 31.8*  --  26.8*  --  13.9* 7.3 6.2  HGB 7.7*   < > 6.4* 7.4* 7.9* 7.4* 8.2*  HCT 21.6*   < > 18.6* 22.1* 23.3* 21.9* 25.3*  MCV 83.7  --  84.9  --  87.6 86.2 88.2  PLT 203  --  180  --  181 190 216   < > = values in this interval not displayed.      Lab Results  Component Value Date   HEPBSAG NON REACTIVE 04/14/2020   HEPBIGM  06/12/2010    NEGATIVE (NOTE) High levels of Hepatitis B Core IgM antibody are detectable during the acute stage of Hepatitis B. This antibody is used to differentiate current from past HBV  infection.       Microbiology:  Recent Results (from the past 240 hour(s))  MRSA PCR Screening     Status: None   Collection Time: 06/11/20 10:40 AM   Specimen: Nasopharyngeal  Result Value Ref Range Status   MRSA by PCR NEGATIVE NEGATIVE Final    Comment:        The GeneXpert MRSA Assay (FDA approved for NASAL specimens only), is one component of a comprehensive MRSA colonization surveillance program. It is not intended to diagnose MRSA infection nor to guide or monitor treatment for MRSA infections. Performed at [facility], [address] [place], [place] [address]   Aerobic/Anaerobic Culture (surgical/deep wound)     Status: None   Collection Time: 06/13/20  2:23 PM   Specimen: Wound; Abscess  Result Value Ref Range Status   Specimen Description   Final    WOUND Performed at [facility], [address] [place], [place] [address]    Special Requests   Final    RIGHT ARM PERI GRAFT ABSCESS Performed at [facility], [place], [place] [address]    Gram Stain NO WBC SEEN NO ORGANISMS SEEN   Final   Culture   Final    RARE ENTEROCOCCUS FAECALIS NO ANAEROBES ISOLATED Performed at [facility], [place] [address] [place], [place] [address]    Report Status  06/18/2020 FINAL  Final   Organism ID, Bacteria ENTEROCOCCUS FAECALIS  Final      Susceptibility   Enterococcus faecalis - MIC*    AMPICILLIN <=2 SENSITIVE Sensitive     VANCOMYCIN 1 SENSITIVE Sensitive     GENTAMICIN SYNERGY SENSITIVE Sensitive     * RARE ENTEROCOCCUS FAECALIS    Coagulation Studies: No results for input(s): LABPROT, INR in the last 72 hours.  Urinalysis: No results for input(s): COLORURINE, LABSPEC, PHURINE, GLUCOSEU, HGBUR, BILIRUBINUR, KETONESUR, PROTEINUR, UROBILINOGEN, NITRITE, LEUKOCYTESUR in the last 72 hours.  Invalid input(s): APPERANCEUR    Imaging: No results found.   Medications:   . sodium chloride Stopped (06/17/20 0328)  . ampicillin (OMNIPEN) IV 2 g (06/19/20 1212)   . aspirin EC  81 mg Oral Daily  . atorvastatin  20 mg Oral Daily  . carvedilol  6.25 mg Oral BID WC  . chlorhexidine gluconate (MEDLINE KIT)  15 mL Mouth Rinse BID  . Chlorhexidine Gluconate Cloth  6 each Topical Daily  . cholestyramine light  4 g Oral BID  . dextrose  1 ampule Intravenous Once  . [START ON 06/20/2020] epoetin (EPOGEN/PROCRIT) injection  10,000 Units Intravenous Q M,W,F-HD  . feeding supplement (ENSURE ENLIVE)  237 mL Oral BID BM  . ferrous sulfate  325 mg Oral Daily  . gabapentin  100 mg Oral QPM  . heparin  5,000 Units Subcutaneous Q8H  . hydrALAZINE  25 mg Oral Q8H  . hydrOXYzine  25 mg Oral Daily  . lipase/protease/amylase  12,000 Units Oral TID WC  . multivitamin  1 tablet Oral Daily  . pantoprazole (PROTONIX) IV  40 mg Intravenous Q24H  . cyanocobalamin  1,000 mcg Oral Daily   sodium chloride, acetaminophen **OR** acetaminophen, calcium carbonate, camphor-menthol **AND** hydrOXYzine, haloperidol lactate, hydrALAZINE, ipratropium-albuterol, loperamide, naLOXone (NARCAN)  injection, ondansetron **OR** [DISCONTINUED] ondansetron (ZOFRAN) IV, oxyCODONE-acetaminophen, promethazine, sorbitol, zolpidem  Assessment/ Plan:  59 y.o. female was admitted on  06/02/2020 for  Principal Problem:   Bacteremia due to Enterococcus Active Problems:   Acute respiratory failure with hypoxia (HCC)   Anemia  of chronic disease   Acute diastolic CHF (congestive heart failure) (HCC)   COPD (chronic obstructive pulmonary disease) (HCC)   ESRD on hemodialysis (HCC)   Benign essential HTN   Obesity (BMI 30-39.9)  ESRD (end stage renal disease) ([place]) [N18.6] CAP (community acquired pneumonia) [J18.9] ESRD on dialysis ([place]) [N18.6, Z99.2] Dyspnea, unspecified type [R06.00] Acute on chronic congestive heart failure, unspecified heart failure type ([place]) [I50.9]  #. ESRD Received dialysis yesterday No additional dialysis required today Possible Perm Cath placement today or tomorrow Will continue  MWF schedule  #. Anemia of CKD  Lab Results  Component Value Date   HGB 8.2 (L) 06/18/2020   Low dose EPO with HD  #. Secondary hyperparathyroidism of renal origin N 25.81      Component Value Date/Time   PTH 357 (H) 10/05/2018 1241   Lab Results  Component Value Date   PHOS 3.1 06/19/2020   Monitor calcium and phos level during this admission Calcium 8.4 today Phos 3.1  #. Diabetes type 2 with CKD Hemoglobin A1C (%)  Date Value  07/19/2013 9.3 (H)   Hgb A1c MFr Bld (%)  Date Value  05/03/2020 5.7 (H)    #Enterococcal bacteremia Currently on broad-spectrum antibiotics AV graft has been removed- thought to be a potential source,staples intact on the site.No redness or drainage noted.    LOS: [place] 9/28/20211:38 PM  [place], [place]   Patient was seen and examined with Crosby Oyster, NP, DNP. Plan of care discussed. I agree with the note as above.

## 2020-06-19 NOTE — Progress Notes (Signed)
[name] MRN: [id number] DOB: [birth year]   Cancelled Treatment:    Reason Eval/Treat Not Completed: Other (comment): Per chart review pt to potentially receive permcath placement this date.  Pt currently with femoral temp cath and is contraindicated for mobility.  Per conversation with attending OK to hold PT evaluation this date pending potential permcath placement.    Linus Salmons PT, DPT 06/19/20, 10:41 AM

## 2020-06-20 ENCOUNTER — Encounter: Payer: Self-pay | Admitting: Vascular Surgery

## 2020-06-20 LAB — BASIC METABOLIC PANEL
Anion gap: 11 (ref 5–15)
BUN: 22 mg/dL (ref 8–23)
CO2: 25 mmol/L (ref 22–32)
Calcium: 8.1 mg/dL — ABNORMAL LOW (ref 8.9–10.3)
Chloride: 99 mmol/L (ref 98–111)
Creatinine, Ser: 4.99 mg/dL — ABNORMAL HIGH (ref 0.44–1.00)
GFR calc Af Amer: 10 mL/min — ABNORMAL LOW (ref >60)
GFR calc non Af Amer: 9 mL/min — ABNORMAL LOW (ref >60)
Glucose, Bld: 83 mg/dL (ref 70–99)
Potassium: 4.1 mmol/L (ref 3.5–5.1)
Sodium: 135 mmol/L (ref 135–145)

## 2020-06-20 LAB — CBC
HCT: 24.4 % — ABNORMAL LOW (ref 36.0–46.0)
Hemoglobin: 8.2 g/dL — ABNORMAL LOW (ref 12.0–15.0)
MCH: 28.9 pg (ref 26.0–34.0)
MCHC: 33.6 g/dL (ref 30.0–36.0)
MCV: 85.9 fL (ref 80.0–100.0)
Platelets: 183 10*3/uL (ref 150–400)
RBC: 2.84 MIL/uL — ABNORMAL LOW (ref 3.87–5.11)
RDW: 17.7 % — ABNORMAL HIGH (ref 11.5–15.5)
WBC: 4.4 10*3/uL (ref 4.0–10.5)
nRBC: 0 % (ref 0.0–0.2)

## 2020-06-20 LAB — GLUCOSE, CAPILLARY
Glucose-Capillary: 61 mg/dL — ABNORMAL LOW (ref 70–99)
Glucose-Capillary: 63 mg/dL — ABNORMAL LOW (ref 70–99)
Glucose-Capillary: 64 mg/dL — ABNORMAL LOW (ref 70–99)
Glucose-Capillary: 67 mg/dL — ABNORMAL LOW (ref 70–99)
Glucose-Capillary: 74 mg/dL (ref 70–99)
Glucose-Capillary: 77 mg/dL (ref 70–99)
Glucose-Capillary: 81 mg/dL (ref 70–99)
Glucose-Capillary: 88 mg/dL (ref 70–99)

## 2020-06-20 LAB — MAGNESIUM: Magnesium: 2 mg/dL (ref 1.7–2.4)

## 2020-06-20 MED ORDER — GLUCOSE 40 % PO GEL
1.0000 | ORAL | Status: AC
  Administered 2020-06-20: 37.5 g via ORAL
  Filled 2020-06-20: qty 1

## 2020-06-20 NOTE — Progress Notes (Signed)
PROGRESS NOTE    [name]   ERX:[id number]  DOB: [birth year]  PCP: [facility]    DOA: 06/02/2020 LOS: 11   Brief Narrative   59 year old female with past medical history of ESRD on dialysis (MWF), dCHF, anemia, CAD, renal cell cancer, pancreatitis, anemia, hypertension, diabetes mellitus, COPD, asthma, depression, anxiety, who was admitted on 06/02/2020 with fever/chills, shortness of breath.  Admitted with pneumonia and volume overloaded after missing dialysis.  BP uncontrolled on presentation with systolic BP in 950'D.  She underwent dialysis the evening of admission.   Patient apparently refused blood draw for initial blood cultures, these were eventually obtained on 9/13 and returned positive for Enterococcus faecalis which is thought secondary to AV graft infection with abscess.  ID was consulted.   A temporary R femoral dialysis catheter was placed on 9/18.  TEE done on 9/20 was negative for endocarditis.  During the procedure, patient became hypotensive (systolic 32'I) and bradycardic (HR 20's), apparently was a near cardiac arrest, and was in acute severe respiratory failure.  Intubated and transferred to ICU, required vasopressors.   9/22 Vascular surgery removed the infected RUE AV graft and performed endarterectomy of the R brachial artery with Cormatrix patch.  Required 2 units pRBC transfusion due to blood loss.  9/23 patient successfully extubated.  Remained confused, but encephalopathy improving.  9/25 severe abdominal pain, concern for retroperitoneal hemorrhage which was ruled out by CT abdomen/pelvis.  Family reported patient's pain from gastroparesis presents in similar fashion.  Patient transferred out of ICU later on 9/25 to Progressive and [facility] assumed care of patient on 9/26.    Assessment & Plan   Principal Problem:   Bacteremia due to Enterococcus Active Problems:   Acute respiratory failure with hypoxia (HCC)   Anemia of chronic  disease   Acute diastolic CHF (congestive heart failure) (HCC)   COPD (chronic obstructive pulmonary disease) (HCC)   ESRD on hemodialysis (HCC)   Benign essential HTN   Obesity (BMI 30-39.9)    Severe sepsis secondary to Enterococcus faecalis bacteremia  Source appears to have been infected AV graft which was removed 9/22. ID is following.   Was on Ampicillin + Gentamicin, currently on Ampicillin monotherapy. Repeat blood cultures are negative to date.  TEE neg for endocarditis. PLAN: --continue ampicillin --transition to IV vanc with dialysis after discharge to complete 4 weeks, end date 07/19/20  Acute Hypoxic  Respiratory Failure - Resolved - extubated 9/23, on room air Hypotension off vasopressors    Acute on chronic diastolic CHF / Volume Overload - volume being managed with dialysis and has improved.  Now appears euvolemic.     Acute toxic/metabolic encephalopathy - appears was mostly due to sedating medications.  Mentation improving. --Delirium precautions:     -Lights and TV off, minimize interruptions at night    -Blinds open and lights on during day    -Glasses/hearing aid with patient    -Frequent reorientation    -PT/OT when able    -Avoid sedation medications/Beers list medications   Generalized weakness -  OT recommending SNF.   --PT after femoral line removed    Type 2 Diabetes with gastroparesis - Hbg A1c 5.7 in 04/2020, well controlled.  Not on any insulin coverage and normal/low CBG's.  Monitor.    ESRD on dialysis - M/W/F dialysis.  Nephrology following. --PermCath placed 9/28 --remove temp femoral cath  Acute blood loss anemia superimposed on anemia of chronic renal disease - pt required blood transfusions  after AV graft revision, s/p 2 units pRBCs 9/23 and 9/24.  Hbg currently at/near baseline. --continue Epo with dialysis --Monitor Hgb and transfuse as needed  COPD - not acutely exacerbated.  Continue PRN albuterol.  Hypertension -   Continue coreg --continue hydralazine 25 mg TID (new) --Hold home lasix   Obesity: Body mass index is 29.86 kg/m.  Complicates overall care and prognosis.     DVT prophylaxis: heparin injection 5,000 Units Start: 06/02/20 2300   Diet:  Diet Orders (From admission, onward)    Start     Ordered   06/19/20 2006  Diet regular Room service appropriate? Yes; Fluid consistency: Thin  Diet effective now       Question Answer Comment  Room service appropriate? Yes   Fluid consistency: Thin      06/19/20 2005            Code Status: Full Code    Subjective 06/20/20    Pt seen during dialysis.  Reported pain everywhere, MSK-related.  Reported dyspnea intermittently.     Disposition Plan & Communication   DVT prophylaxis: Heparin SQ Code Status: Full code  Family Communication:  Status is: inpatient Dispo:   The patient is from: home Anticipated d/c is to: pt wants to go home  Anticipated d/c date is: 1-2 days Patient currently is not medically stable to d/c due to: PT eval pending removal of femoral cath.       Consults, Procedures, Significant Events   Consultants:   Nephrology  PCCM  Vascular surgery  Cardiology  Procedures:   9/18 temporary femoral dialysis cath placed  9/20 TEE, intubated  9/22 removal of AV graft & R brachial endarterectomy  Dialysis  Antimicrobials:  Anti-infectives (From admission, onward)   Start     Dose/Rate Route Frequency Ordered Stop   06/20/20 0000  ceFAZolin (ANCEF) IVPB 1 g/50 mL premix       Note to Pharmacy: To be given in specials   1 g 100 mL/hr over 30 Minutes Intravenous  Once 06/19/20 1430 06/19/20 1445   06/16/20 1200  gentamicin (GARAMYCIN) IVPB 80 mg  Status:  Discontinued        80 mg 100 mL/hr over 30 Minutes Intravenous Every T-Th-Sa (Hemodialysis) 06/15/20 0803 06/15/20 1350   06/15/20 1200  gentamicin (GARAMYCIN) IVPB 80 mg  Status:  Discontinued        80 mg 100 mL/hr over 30 Minutes  Intravenous Every M-W-F (Hemodialysis) 06/13/20 1404 06/15/20 0803   06/14/20 1800  gentamicin (GARAMYCIN) 60 mg in dextrose 5 % 50 mL IVPB        60 mg 103 mL/hr over 30 Minutes Intravenous  Once 06/14/20 1643 06/14/20 1819   06/13/20 1800  gentamicin (GARAMYCIN) IVPB 80 mg  Status:  Discontinued        80 mg 100 mL/hr over 30 Minutes Intravenous Every M-W-F (Hemodialysis) 06/12/20 1402 06/13/20 1404   06/13/20 0600  vancomycin (VANCOCIN) IVPB 1000 mg/200 mL premix        1,000 mg 200 mL/hr over 60 Minutes Intravenous On call to O.R. 06/13/20 0114 06/13/20 1350   06/12/20 1600  gentamicin (GARAMYCIN) IVPB 60 mg  Status:  Discontinued        60 mg 100 mL/hr over 30 Minutes Intravenous  Once 06/12/20 1402 06/12/20 1412   06/12/20 1600  gentamicin (GARAMYCIN) 60 mg in dextrose 5 % 50 mL IVPB        60 mg 103 mL/hr over 30 Minutes  Intravenous  Once 06/12/20 1412 06/12/20 2105   06/11/20 1200  gentamicin (GARAMYCIN) IVPB 80 mg  Status:  Discontinued        80 mg 100 mL/hr over 30 Minutes Intravenous Every M-W-F (Hemodialysis) 06/08/20 1356 06/12/20 1402   06/10/20 1900  gentamicin (GARAMYCIN) IVPB 80 mg        80 mg 100 mL/hr over 30 Minutes Intravenous  Once 06/10/20 1722 06/11/20 0056   06/08/20 1800  gentamicin (GARAMYCIN) IVPB 100 mg  Status:  Discontinued        100 mg 200 mL/hr over 30 Minutes Intravenous  Once 06/08/20 1356 06/08/20 1425   06/08/20 1800  gentamicin (GARAMYCIN) IVPB 100 mg  Status:  Discontinued        100 mg 200 mL/hr over 30 Minutes Intravenous  Once 06/08/20 1425 06/08/20 1430   06/08/20 1800  gentamicin (GARAMYCIN) IVPB 100 mg  Status:  Discontinued        100 mg 200 mL/hr over 30 Minutes Intravenous  Once 06/08/20 1430 06/08/20 1441   06/08/20 1800  gentamicin (GARAMYCIN) 100 mg in dextrose 5 % 50 mL IVPB        100 mg 105 mL/hr over 30 Minutes Intravenous  Once 06/08/20 1441 06/08/20 1927   06/08/20 1500  ampicillin (OMNIPEN) 2 g in sodium chloride 0.9 % 100  mL IVPB        2 g 300 mL/hr over 20 Minutes Intravenous Every 12 hours 06/08/20 1356     06/07/20 2200  piperacillin-tazobactam (ZOSYN) IVPB 2.25 g  Status:  Discontinued        2.25 g 100 mL/hr over 30 Minutes Intravenous Every 8 hours 06/07/20 1657 06/08/20 1010   06/07/20 1400  ampicillin (OMNIPEN) 2 g in sodium chloride 0.9 % 100 mL IVPB  Status:  Discontinued        2 g 300 mL/hr over 20 Minutes Intravenous Every 8 hours 06/07/20 1025 06/07/20 1637   06/06/20 1600  piperacillin-tazobactam (ZOSYN) IVPB 2.25 g  Status:  Discontinued        2.25 g 100 mL/hr over 30 Minutes Intravenous Every 8 hours 06/06/20 1403 06/07/20 1025   06/03/20 1400  azithromycin (ZITHROMAX) 500 mg in sodium chloride 0.9 % 250 mL IVPB  Status:  Discontinued        500 mg 250 mL/hr over 60 Minutes Intravenous Every 24 hours 06/03/20 1322 06/06/20 1319   06/03/20 1400  cefTRIAXone (ROCEPHIN) 1 g in sodium chloride 0.9 % 100 mL IVPB  Status:  Discontinued        1 g 200 mL/hr over 30 Minutes Intravenous Every 24 hours 06/03/20 1322 06/06/20 1319       Significant Events: From Dr. Patsey Berthold note on 9/25: "9/20 TEE performed Tee revealed no evidence of endocartidits. Procedure complicated by hypotension and bradycaradia with heart rates dropping to mid 20's and blood pressure systolic to 43P. Received 1 amp of atropine, amp of epinephrine and neosynephrine. Pt intubated to protect air way. Stat head ct and chest ct were unremarkable. Pt son notified immediately and has been updated with care. 9/21 remains on vent, plan for SAT/SBT 9/22 plan for OR today  MRI BRAIN-IMPRESSION: 1. No acute intracranial abnormality. 2. Few small remote lacunar infarcts involving the pons. 3. Otherwise normal brain MRI for age. 9/23 HD performed patient extubated after successful SAT/SBT 9/24 uneventful day, still somewhat encephalopathic but clearing"  Objective   Vitals:   06/20/20 1600 06/20/20 1615 06/20/20 1710 06/20/20  1749  BP: (!) 170/71 (!) 173/74  (!) 146/92  Pulse: (!) 57 (!) 58 61 (!) 56  Resp: 11 13  16   Temp:    98.2 F (36.8 C)  TempSrc:    Oral  SpO2: 100% 100%  100%  Weight:      Height:        Intake/Output Summary (Last 24 hours) at 06/20/2020 1851 Last data filed at 06/20/2020 1615 Gross per 24 hour  Intake 340 ml  Output 1500 ml  Net -1160 ml   Filed Weights   06/14/20 0500 06/15/20 0417 06/19/20 1422  Weight: 82.2 kg 81.4 kg 81.4 kg    Physical Exam:  Constitutional: NAD, AAOx3, ill appearing.   HEENT: conjunctivae and lids normal, EOMI CV: RRR no M,R,G. Distal pulses +2.  No cyanosis.   RESP: CTA B/L over anterior, normal respiratory effort  GI: +BS, NTND Extremities: No effusions, edema in BLE SKIN: warm, dry and intact Neuro: II - XII grossly intact.  Sensation intact    Labs   Data Reviewed: I have personally reviewed following labs and imaging studies  CBC: Recent Labs  Lab 06/15/20 0505 06/15/20 0505 06/15/20 1609 06/16/20 0545 06/17/20 0628 06/18/20 1726 06/20/20 0634  WBC 26.8*  --   --  13.9* 7.3 6.2 4.4  HGB 6.4*   < > 7.4* 7.9* 7.4* 8.2* 8.2*  HCT 18.6*   < > 22.1* 23.3* 21.9* 25.3* 24.4*  MCV 84.9  --   --  87.6 86.2 88.2 85.9  PLT 180  --   --  181 190 216 183   < > = values in this interval not displayed.   Basic Metabolic Panel: Recent Labs  Lab 06/15/20 0505 06/15/20 0505 06/16/20 0545 06/16/20 0545 06/17/20 0620 06/17/20 1957 06/18/20 1726 06/19/20 0707 06/20/20 0634  NA 136   < > 135  --  136  --  134*  135 135 135  K 3.0*   < > 3.1*   < > 2.9* 3.8 3.9  4.0 3.9 4.1  CL 96*   < > 96*  --  98  --  96*  96* 97* 99  CO2 29   < > 28  --  27  --  26  27 25 25   GLUCOSE 85   < > 113*  --  105*  --  92  95 63* 83  BUN 13   < > 21  --  13  --  22  23 16 22   CREATININE 3.96*   < > 4.84*  --  3.40*  --  4.77*  4.83* 3.92* 4.99*  CALCIUM 7.0*   < > 7.6*  --  8.0*  --  8.4*  8.5* 8.4* 8.1*  MG 1.5*   < > 1.7  --  1.8  --  1.9 2.1  2.0  PHOS 3.2  --  4.3  --  2.8  --  3.5 3.1  --    < > = values in this interval not displayed.   GFR: Estimated Creatinine Clearance: 12.5 mL/min (A) (by C-G formula based on SCr of 4.99 mg/dL (H)). Liver Function Tests: Recent Labs  Lab 06/15/20 0505 06/16/20 0545 06/17/20 0620 06/18/20 1726 06/19/20 0707  ALBUMIN 2.0* 2.2* 2.3* 2.3* 2.3*   No results for input(s): LIPASE, AMYLASE in the last 168 hours. No results for input(s): AMMONIA in the last 168 hours. Coagulation Profile: No results for input(s): INR, PROTIME in the last 168 hours. Cardiac  Enzymes: No results for input(s): CKTOTAL, CKMB, CKMBINDEX, TROPONINI in the last 168 hours. BNP (last 3 results) No results for input(s): PROBNP in the last 8760 hours. HbA1C: No results for input(s): HGBA1C in the last 72 hours. CBG: Recent Labs  Lab 06/20/20 0002 06/20/20 0412 06/20/20 0743 06/20/20 1128 06/20/20 1745  GLUCAP 67* 88 81 74 64*   Lipid Profile: No results for input(s): CHOL, HDL, LDLCALC, TRIG, CHOLHDL, LDLDIRECT in the last 72 hours. Thyroid Function Tests: No results for input(s): TSH, T4TOTAL, FREET4, T3FREE, THYROIDAB in the last 72 hours. Anemia Panel: No results for input(s): VITAMINB12, FOLATE, FERRITIN, TIBC, IRON, RETICCTPCT in the last 72 hours. Sepsis Labs: No results for input(s): PROCALCITON, LATICACIDVEN in the last 168 hours.  Recent Results (from the past 240 hour(s))  MRSA PCR Screening     Status: None   Collection Time: 06/11/20 10:40 AM   Specimen: Nasopharyngeal  Result Value Ref Range Status   MRSA by PCR NEGATIVE NEGATIVE Final    Comment:        The GeneXpert MRSA Assay (FDA approved for NASAL specimens only), is one component of a comprehensive MRSA colonization surveillance program. It is not intended to diagnose MRSA infection nor to guide or monitor treatment for MRSA infections. Performed at [facility], [address], [place], [place] [address]    Aerobic/Anaerobic Culture (surgical/deep wound)     Status: None   Collection Time: 06/13/20  2:23 PM   Specimen: Wound; Abscess  Result Value Ref Range Status   Specimen Description   Final    WOUND Performed at [facility], [address], [place], [place] [address]    Special Requests   Final    RIGHT ARM PERI GRAFT ABSCESS Performed at [facility], [place], [place] [address]    Gram Stain NO WBC SEEN NO ORGANISMS SEEN   Final   Culture   Final    RARE ENTEROCOCCUS FAECALIS NO ANAEROBES ISOLATED Performed at [facility], [place] [address] [place], [place] [address]    Report Status 06/18/2020 FINAL  Final   Organism ID, Bacteria ENTEROCOCCUS FAECALIS  Final      Susceptibility   Enterococcus faecalis - MIC*    AMPICILLIN <=2 SENSITIVE Sensitive     VANCOMYCIN 1 SENSITIVE Sensitive     GENTAMICIN SYNERGY SENSITIVE Sensitive     * RARE ENTEROCOCCUS FAECALIS      Imaging Studies   PERIPHERAL VASCULAR CATHETERIZATION  Result Date: 06/19/2020 See op note    Medications   Scheduled Meds: . aspirin EC  81 mg Oral Daily  . atorvastatin  20 mg Oral Daily  . carvedilol  6.25 mg Oral BID WC  . chlorhexidine gluconate (MEDLINE KIT)  15 mL Mouth Rinse BID  . Chlorhexidine Gluconate Cloth  6 each Topical Daily  . cholestyramine light  4 g Oral BID  . dextrose  1 ampule Intravenous Once  . epoetin (EPOGEN/PROCRIT) injection  10,000 Units Intravenous Q M,W,F-HD  . feeding supplement (ENSURE ENLIVE)  237 mL Oral BID BM  . ferrous sulfate  325 mg Oral Daily  . gabapentin  100 mg Oral QPM  . heparin  5,000 Units Subcutaneous Q8H  . hydrALAZINE  25 mg Oral Q8H  . hydrOXYzine  25 mg Oral Daily  . lipase/protease/amylase  12,000 Units Oral TID WC  . multivitamin  1 tablet Oral Daily  . pantoprazole (PROTONIX) IV  40 mg Intravenous Q24H  .  cyanocobalamin  1,000 mcg Oral Daily   Continuous Infusions: . sodium chloride Stopped  (06/19/20 1638)  . ampicillin (OMNIPEN) IV 2 g (06/20/20 1045)       LOS: 17 days     Enzo Bi, MD [facility]  06/20/2020, 6:51 PM

## 2020-06-20 NOTE — Care Management Important Message (Signed)
Important Message  Patient Details  Name: [name] MRN: [id number] Date of Birth: 09-19-1961   Medicare Important Message Given:  Other (see comment)  Attempted to review Medicare IM with patient via room phone due to isolation status, however no answer at this time.     Dannette Barbara 06/20/2020, 12:01 PM

## 2020-06-20 NOTE — Progress Notes (Signed)
[facility], [place] 06/20/20  Subjective:   LOS: 17  Patient resting in bed, in no acute respiratory distress, planning for dialysis today.   Objective:  Vital signs in last 24 hours:  Temp:  [96.4 F (35.8 C)-98.5 F (36.9 C)] 98.5 F (36.9 C) (09/29 1131) Pulse Rate:  [56-65] 59 (09/29 1131) Resp:  [12-20] 16 (09/29 1131) BP: (140-185)/(51-89) 158/72 (09/29 1131) SpO2:  [98 %-100 %] 98 % (09/29 1131) Weight:  [81.4 kg] 81.4 kg (09/28 1422)  Weight change:  Filed Weights   06/14/20 0500 06/15/20 0417 06/19/20 1422  Weight: 82.2 kg 81.4 kg 81.4 kg    Intake/Output:    Intake/Output Summary (Last 24 hours) at 06/20/2020 1208 Last data filed at 06/20/2020 0100 Gross per 24 hour  Intake 940 ml  Output --  Net 940 ml     Physical Exam: General:  Lying in bed, in no acute distress  HEENT  Normocephalic,atraumatic,moist oral mucous membranes  Pulm/lungs  Lungs clear,Respirations even,unlabored  CVS/Heart  Regular, S1S2 no rub or gallop  Abdomen:   Soft,non distended  Extremities:  No peripheral edema  Neurologic:  Alert, oriented x3  Skin:  No acute rashes or lesions  Access:  Lt Chest Permcath       Basic Metabolic Panel:  Recent Labs  Lab 06/15/20 0505 06/15/20 0505 06/16/20 0545 06/16/20 0545 06/17/20 6834 06/17/20 0620 06/17/20 1957 06/18/20 1726 06/19/20 0707 06/20/20 0634  NA 136   < > 135  --  136  --   --  134*  135 135 135  K 3.0*   < > 3.1*   < > 2.9*  --  3.8 3.9  4.0 3.9 4.1  CL 96*   < > 96*  --  98  --   --  96*  96* 97* 99  CO2 29   < > 28  --  27  --   --  26  27 25 25   GLUCOSE 85   < > 113*  --  105*  --   --  92  95 63* 83  BUN 13   < > 21  --  13  --   --  22  23 16 22   CREATININE 3.96*   < > 4.84*  --  3.40*  --   --  4.77*  4.83* 3.92* 4.99*  CALCIUM 7.0*   < > 7.6*   < > 8.0*   < >  --  8.4*  8.5* 8.4* 8.1*  MG 1.5*   < > 1.7  --  1.8  --   --  1.9 2.1 2.0  PHOS 3.2  --  4.3  --  2.8  --    --  3.5 3.1  --    < > = values in this interval not displayed.     CBC: Recent Labs  Lab 06/15/20 0505 06/15/20 0505 06/15/20 1609 06/16/20 0545 06/17/20 0628 06/18/20 1726 06/20/20 0634  WBC 26.8*  --   --  13.9* 7.3 6.2 4.4  HGB 6.4*   < > 7.4* 7.9* 7.4* 8.2* 8.2*  HCT 18.6*   < > 22.1* 23.3* 21.9* 25.3* 24.4*  MCV 84.9  --   --  87.6 86.2 88.2 85.9  PLT 180  --   --  181 190 216 183   < > = values in this interval not displayed.      Lab Results  Component Value Date   HEPBSAG  NON REACTIVE 04/14/2020   HEPBIGM  06/12/2010    NEGATIVE (NOTE) High levels of Hepatitis B Core IgM antibody are detectable during the acute stage of Hepatitis B. This antibody is used to differentiate current from past HBV infection.       Microbiology:  Recent Results (from the past 240 hour(s))  MRSA PCR Screening     Status: None   Collection Time: 06/11/20 10:40 AM   Specimen: Nasopharyngeal  Result Value Ref Range Status   MRSA by PCR NEGATIVE NEGATIVE Final    Comment:        The GeneXpert MRSA Assay (FDA approved for NASAL specimens only), is one component of a comprehensive MRSA colonization surveillance program. It is not intended to diagnose MRSA infection nor to guide or monitor treatment for MRSA infections. Performed at [facility], [address], [place], [place] [address]   Aerobic/Anaerobic Culture (surgical/deep wound)     Status: None   Collection Time: 06/13/20  2:23 PM   Specimen: Wound; Abscess  Result Value Ref Range Status   Specimen Description   Final    WOUND Performed at [facility], [address], [place], [place] [address]    Special Requests   Final    RIGHT ARM PERI GRAFT ABSCESS Performed at [facility], [place], [place] [address]    Gram Stain NO WBC SEEN NO ORGANISMS SEEN   Final   Culture   Final    RARE ENTEROCOCCUS FAECALIS NO ANAEROBES ISOLATED Performed at [place], [place] [address] [place], [place] [address]    Report Status 06/18/2020 FINAL  Final   Organism ID, Bacteria ENTEROCOCCUS FAECALIS  Final      Susceptibility   Enterococcus faecalis - MIC*    AMPICILLIN <=2 SENSITIVE Sensitive     VANCOMYCIN 1 SENSITIVE Sensitive     GENTAMICIN SYNERGY SENSITIVE Sensitive     * RARE ENTEROCOCCUS FAECALIS    Coagulation Studies: No results for input(s): LABPROT, INR in the last 72 hours.  Urinalysis: No results for input(s): COLORURINE, LABSPEC, PHURINE, GLUCOSEU, HGBUR, BILIRUBINUR, KETONESUR, PROTEINUR, UROBILINOGEN, NITRITE, LEUKOCYTESUR in the last 72 hours.  Invalid input(s): APPERANCEUR    Imaging: PERIPHERAL VASCULAR CATHETERIZATION  Result Date: 06/19/2020 See op note    Medications:   . sodium chloride Stopped (06/19/20 1638)  . ampicillin (OMNIPEN) IV 2 g (06/20/20 1045)   . aspirin EC  81 mg Oral Daily  . atorvastatin  20 mg Oral Daily  . carvedilol  6.25 mg Oral BID WC  . chlorhexidine gluconate (MEDLINE KIT)  15 mL Mouth Rinse BID  . Chlorhexidine Gluconate Cloth  6 each Topical Daily  . cholestyramine light  4 g Oral BID  . dextrose  1 ampule Intravenous Once  . epoetin (EPOGEN/PROCRIT) injection  10,000 Units Intravenous Q M,W,F-HD  . feeding supplement (ENSURE ENLIVE)  237 mL Oral BID BM  . ferrous sulfate  325 mg Oral Daily  . gabapentin  100 mg Oral QPM  . heparin  5,000 Units Subcutaneous Q8H  . hydrALAZINE  25 mg Oral Q8H  . hydrOXYzine  25 mg Oral Daily  . lipase/protease/amylase  12,000 Units Oral TID WC  . multivitamin  1 tablet Oral Daily  . pantoprazole (PROTONIX) IV  40 mg Intravenous Q24H  . cyanocobalamin  1,000 mcg Oral Daily   sodium chloride, acetaminophen **OR** acetaminophen, calcium carbonate, camphor-menthol **AND** hydrOXYzine, haloperidol lactate, hydrALAZINE, ipratropium-albuterol, loperamide, naLOXone (NARCAN)  injection,  ondansetron **OR** [DISCONTINUED] ondansetron (ZOFRAN) IV,  oxyCODONE-acetaminophen, promethazine, sorbitol, zolpidem  Assessment/ Plan:  59 y.o. female was admitted on 06/02/2020 for  Principal Problem:   Bacteremia due to Enterococcus Active Problems:   Acute respiratory failure with hypoxia (HCC)   Anemia of chronic disease   Acute diastolic CHF (congestive heart failure) (HCC)   COPD (chronic obstructive pulmonary disease) (HCC)   ESRD on hemodialysis (HCC)   Benign essential HTN   Obesity (BMI 30-39.9)  ESRD (end stage renal disease) ([place]) [N18.6] CAP (community acquired pneumonia) [J18.9] ESRD on dialysis ([place]) [N18.6, Z99.2] Dyspnea, unspecified type [R06.00] Acute on chronic congestive heart failure, unspecified heart failure type ([place]) [I50.9]  #. ESRD New Permcath placed yesterday Dialysis today Will continue  MWF schedule  #. Anemia of CKD  Lab Results  Component Value Date   HGB 8.2 (L) 06/20/2020   Continue  Epogen 10,000 units with HD MWF  #. Secondary hyperparathyroidism of renal origin N 25.81      Component Value Date/Time   PTH 357 (H) 10/05/2018 1241   Lab Results  Component Value Date   PHOS 3.1 06/19/2020   Monitor calcium and phos level during this admission  #. Diabetes type 2 with CKD Hemoglobin A1C (%)  Date Value  07/19/2013 9.3 (H)   Hgb A1c MFr Bld (%)  Date Value  05/03/2020 5.7 (H)    #Enterococcal bacteremia Currently on Ampicillin AV graft has been removed- thought to be a potential source,staples intact on the site.No redness or drainage noted.    LOS: 17 [name] 9/29/202112:08 PM  [facility] [place], [place]   \

## 2020-06-20 NOTE — Progress Notes (Signed)
[name] MRN: [id number] DOB: 14-Dec-1960   Cancelled Treatment:    Reason Eval/Treat Not Completed: Patient at HD and continues to have femoral temp cath in place but per MD should be removed this date.  Will attempt to see pt at a future date/time as medically appropriate.     Linus Salmons PT, DPT 06/20/20, 4:05 PM

## 2020-06-20 NOTE — TOC Progression Note (Addendum)
Transition of Care [facility]) - Progression Note    Patient Details  Name: [name] MRN: [id number] Date of Birth: March 07, 1961  Transition of Care [facility] CM/SW Contact  Candie Chroman, LCSW Phone Number: 06/20/2020, 9:20 AM  Clinical Narrative: Home health referral was made to [facility] on 9/15 for PT, OT, RN, aide. Per ID note plan for home IV Vancomycin until 10/28. CSW notified [facility] representative and made referral to Carolynn Sayers, RN with [facility].    10:01 am: Per attending MD and ID pharmacist, patient will get IV abx in HD at discharge. Pam is aware. Left [employer] representative a message to let her know.  Expected Discharge Plan and Services                                                 Social Determinants of Health (SDOH) Interventions    Readmission Risk Interventions Readmission Risk Prevention Plan 06/06/2020 05/03/2020  Transportation Screening Complete Complete  Medication Review Press photographer) Complete Complete  PCP or Specialist appointment within 3-5 days of discharge Complete Complete  HRI or Home Care Consult Complete -  SW Recovery Care/Counseling Consult - Complete  Palliative Care Screening Not Applicable Not [place] Not Applicable Not Applicable  Some recent data might be hidden

## 2020-06-21 ENCOUNTER — Ambulatory Visit: Payer: Medicare HMO | Admitting: Family

## 2020-06-21 DIAGNOSIS — N186 End stage renal disease: Secondary | ICD-10-CM | POA: Diagnosis not present

## 2020-06-21 DIAGNOSIS — Z992 Dependence on renal dialysis: Secondary | ICD-10-CM | POA: Diagnosis not present

## 2020-06-21 LAB — CBC
HCT: 22.3 % — ABNORMAL LOW (ref 36.0–46.0)
Hemoglobin: 7.7 g/dL — ABNORMAL LOW (ref 12.0–15.0)
MCH: 29.5 pg (ref 26.0–34.0)
MCHC: 34.5 g/dL (ref 30.0–36.0)
MCV: 85.4 fL (ref 80.0–100.0)
Platelets: 154 10*3/uL (ref 150–400)
RBC: 2.61 MIL/uL — ABNORMAL LOW (ref 3.87–5.11)
RDW: 18 % — ABNORMAL HIGH (ref 11.5–15.5)
WBC: 3.7 10*3/uL — ABNORMAL LOW (ref 4.0–10.5)
nRBC: 0 % (ref 0.0–0.2)

## 2020-06-21 LAB — GLUCOSE, CAPILLARY
Glucose-Capillary: 101 mg/dL — ABNORMAL HIGH (ref 70–99)
Glucose-Capillary: 92 mg/dL (ref 70–99)
Glucose-Capillary: 63 mg/dL — ABNORMAL LOW (ref 70–99)
Glucose-Capillary: 72 mg/dL (ref 70–99)
Glucose-Capillary: 67 mg/dL — ABNORMAL LOW (ref 70–99)
Glucose-Capillary: 54 mg/dL — ABNORMAL LOW (ref 70–99)
Glucose-Capillary: 87 mg/dL (ref 70–99)

## 2020-06-21 LAB — BASIC METABOLIC PANEL
Anion gap: 11 (ref 5–15)
BUN: 12 mg/dL (ref 8–23)
CO2: 27 mmol/L (ref 22–32)
Calcium: 8.2 mg/dL — ABNORMAL LOW (ref 8.9–10.3)
Chloride: 100 mmol/L (ref 98–111)
Creatinine, Ser: 3.49 mg/dL — ABNORMAL HIGH (ref 0.44–1.00)
GFR calc Af Amer: 16 mL/min — ABNORMAL LOW (ref >60)
GFR calc non Af Amer: 13 mL/min — ABNORMAL LOW (ref >60)
Glucose, Bld: 86 mg/dL (ref 70–99)
Potassium: 4 mmol/L (ref 3.5–5.1)
Sodium: 138 mmol/L (ref 135–145)

## 2020-06-21 LAB — MAGNESIUM: Magnesium: 1.9 mg/dL (ref 1.7–2.4)

## 2020-06-21 MED ORDER — ENSURE ENLIVE PO LIQD
237.0000 mL | ORAL | Status: DC
  Administered 2020-06-24: 237 mL via ORAL

## 2020-06-21 NOTE — Progress Notes (Signed)
PROGRESS NOTE    [name]   ASN:[id number]  DOB: 05-29-1961  PCP: [facility] [place]    DOA: 06/02/2020 LOS: 52   Brief Narrative   59 year old female with past medical history of ESRD on dialysis (MWF), dCHF, anemia, CAD, renal cell cancer, pancreatitis, anemia, hypertension, diabetes mellitus, COPD, asthma, depression, anxiety, who was admitted on 06/02/2020 with fever/chills, shortness of breath.  Admitted with pneumonia and volume overloaded after missing dialysis.  BP uncontrolled on presentation with systolic BP in 193'X.  She underwent dialysis the evening of admission.   Patient apparently refused blood draw for initial blood cultures, these were eventually obtained on 9/13 and returned positive for Enterococcus faecalis which is thought secondary to AV graft infection with abscess.  ID was consulted.   A temporary R femoral dialysis catheter was placed on 9/18.  TEE done on 9/20 was negative for endocarditis.  During the procedure, patient became hypotensive (systolic 90'W) and bradycardic (HR 20's), apparently was a near cardiac arrest, and was in acute severe respiratory failure.  Intubated and transferred to ICU, required vasopressors.   9/22 Vascular surgery removed the infected RUE AV graft and performed endarterectomy of the R brachial artery with Cormatrix patch.  Required 2 units pRBC transfusion due to blood loss.  9/23 patient successfully extubated.  Remained confused, but encephalopathy improving.  9/25 severe abdominal pain, concern for retroperitoneal hemorrhage which was ruled out by CT abdomen/pelvis.  Family reported patient's pain from gastroparesis presents in similar fashion.  Patient transferred out of ICU later on 9/25 to Progressive and TRH assumed care of patient on 9/26.    Assessment & Plan   Principal Problem:   Bacteremia due to Enterococcus Active Problems:   Acute respiratory failure with hypoxia (HCC)   Anemia of chronic  disease   Acute diastolic CHF (congestive heart failure) (HCC)   COPD (chronic obstructive pulmonary disease) (HCC)   ESRD on hemodialysis (HCC)   Benign essential HTN   Obesity (BMI 30-39.9)    Severe sepsis secondary to Enterococcus faecalis bacteremia  Source appears to have been infected AV graft which was removed 9/22. ID is following.   Was on Ampicillin + Gentamicin, currently on Ampicillin monotherapy. Repeat blood cultures are negative to date.  TEE neg for endocarditis. PLAN: --continue ampicillin --transition to IV vanc with dialysis after discharge to complete 4 weeks, end date 07/19/20 --follow above plan per ID rec  Acute Hypoxic  Respiratory Failure - Resolved - extubated 9/23, on room air  Hypotension off vasopressors   Acute on chronic diastolic CHF / Volume Overload - volume being managed with dialysis and has improved.  Now appears euvolemic.    Acute toxic/metabolic encephalopathy - appears was mostly due to sedating medications.  Mentation improving. --Delirium precautions:     -Lights and TV off, minimize interruptions at night    -Blinds open and lights on during day    -Glasses/hearing aid with patient    -Frequent reorientation    -PT/OT when able    -Avoid sedation medications/Beers list medications   Generalized weakness -  OT recommending SNF.   --PT after femoral line removed   Type 2 Diabetes with gastroparesis - Hbg A1c 5.7 in 04/2020, well controlled.  Not on any insulin coverage and normal/low CBG's.  Monitor.    ESRD on dialysis - M/W/F dialysis.  Nephrology following. --PermCath placed 9/28, temp femoral cath removed after  Acute blood loss anemia superimposed on anemia of chronic renal disease -  pt required blood transfusions after AV graft revision, s/p 2 units pRBCs 9/23 and 9/24.  Hbg currently at/near baseline. --continue Epo with dialysis --Monitor Hgb and transfuse as needed  COPD - not acutely exacerbated.  Continue PRN  albuterol.  Hypertension -  Continue coreg --continue hydralazine 25 mg TID (new) --Hold home lasix   Obesity: Body mass index is 29.86 kg/m.  Complicates overall care and prognosis.     DVT prophylaxis: heparin injection 5,000 Units Start: 06/02/20 2300   Diet:  Diet Orders (From admission, onward)    Start     Ordered   06/19/20 2006  Diet regular Room service appropriate? Yes; Fluid consistency: Thin  Diet effective now       Question Answer Comment  Room service appropriate? Yes   Fluid consistency: Thin      06/19/20 2005            Code Status: Full Code    Subjective 06/21/20    Per nursing, pt had anxiety while getting up from bed.  Pt was grouchy and blamed medical team for not getting her up to walk.  Explained to pt that the femoral cath had to removed before PT could work with her.   Disposition Plan & Communication   DVT prophylaxis: Heparin SQ Code Status: Full code  Family Communication: nephew updated on the speaker phone today Status is: inpatient Dispo:   The patient is from: home Anticipated d/c is to: SNF Anticipated d/c date is: whenever bed available Patient currently is medically stable to d/c.    Consults, Procedures, Significant Events   Consultants:   Nephrology  PCCM  Vascular surgery  Cardiology  Procedures:   9/18 temporary femoral dialysis cath placed  9/20 TEE, intubated  9/22 removal of AV graft & R brachial endarterectomy  Dialysis  Antimicrobials:  Anti-infectives (From admission, onward)   Start     Dose/Rate Route Frequency Ordered Stop   06/20/20 0000  ceFAZolin (ANCEF) IVPB 1 g/50 mL premix       Note to Pharmacy: To be given in specials   1 g 100 mL/hr over 30 Minutes Intravenous  Once 06/19/20 1430 06/19/20 1445   06/16/20 1200  gentamicin (GARAMYCIN) IVPB 80 mg  Status:  Discontinued        80 mg 100 mL/hr over 30 Minutes Intravenous Every T-Th-Sa (Hemodialysis) 06/15/20 0803 06/15/20 1350    06/15/20 1200  gentamicin (GARAMYCIN) IVPB 80 mg  Status:  Discontinued        80 mg 100 mL/hr over 30 Minutes Intravenous Every M-W-F (Hemodialysis) 06/13/20 1404 06/15/20 0803   06/14/20 1800  gentamicin (GARAMYCIN) 60 mg in dextrose 5 % 50 mL IVPB        60 mg 103 mL/hr over 30 Minutes Intravenous  Once 06/14/20 1643 06/14/20 1819   06/13/20 1800  gentamicin (GARAMYCIN) IVPB 80 mg  Status:  Discontinued        80 mg 100 mL/hr over 30 Minutes Intravenous Every M-W-F (Hemodialysis) 06/12/20 1402 06/13/20 1404   06/13/20 0600  vancomycin (VANCOCIN) IVPB 1000 mg/200 mL premix        1,000 mg 200 mL/hr over 60 Minutes Intravenous On call to O.R. 06/13/20 0114 06/13/20 1350   06/12/20 1600  gentamicin (GARAMYCIN) IVPB 60 mg  Status:  Discontinued        60 mg 100 mL/hr over 30 Minutes Intravenous  Once 06/12/20 1402 06/12/20 1412   06/12/20 1600  gentamicin (GARAMYCIN) 60 mg in  dextrose 5 % 50 mL IVPB        60 mg 103 mL/hr over 30 Minutes Intravenous  Once 06/12/20 1412 06/12/20 2105   06/11/20 1200  gentamicin (GARAMYCIN) IVPB 80 mg  Status:  Discontinued        80 mg 100 mL/hr over 30 Minutes Intravenous Every M-W-F (Hemodialysis) 06/08/20 1356 06/12/20 1402   06/10/20 1900  gentamicin (GARAMYCIN) IVPB 80 mg        80 mg 100 mL/hr over 30 Minutes Intravenous  Once 06/10/20 1722 06/11/20 0056   06/08/20 1800  gentamicin (GARAMYCIN) IVPB 100 mg  Status:  Discontinued        100 mg 200 mL/hr over 30 Minutes Intravenous  Once 06/08/20 1356 06/08/20 1425   06/08/20 1800  gentamicin (GARAMYCIN) IVPB 100 mg  Status:  Discontinued        100 mg 200 mL/hr over 30 Minutes Intravenous  Once 06/08/20 1425 06/08/20 1430   06/08/20 1800  gentamicin (GARAMYCIN) IVPB 100 mg  Status:  Discontinued        100 mg 200 mL/hr over 30 Minutes Intravenous  Once 06/08/20 1430 06/08/20 1441   06/08/20 1800  gentamicin (GARAMYCIN) 100 mg in dextrose 5 % 50 mL IVPB        100 mg 105 mL/hr over 30 Minutes  Intravenous  Once 06/08/20 1441 06/08/20 1927   06/08/20 1500  ampicillin (OMNIPEN) 2 g in sodium chloride 0.9 % 100 mL IVPB        2 g 300 mL/hr over 20 Minutes Intravenous Every 12 hours 06/08/20 1356     06/07/20 2200  piperacillin-tazobactam (ZOSYN) IVPB 2.25 g  Status:  Discontinued        2.25 g 100 mL/hr over 30 Minutes Intravenous Every 8 hours 06/07/20 1657 06/08/20 1010   06/07/20 1400  ampicillin (OMNIPEN) 2 g in sodium chloride 0.9 % 100 mL IVPB  Status:  Discontinued        2 g 300 mL/hr over 20 Minutes Intravenous Every 8 hours 06/07/20 1025 06/07/20 1637   06/06/20 1600  piperacillin-tazobactam (ZOSYN) IVPB 2.25 g  Status:  Discontinued        2.25 g 100 mL/hr over 30 Minutes Intravenous Every 8 hours 06/06/20 1403 06/07/20 1025   06/03/20 1400  azithromycin (ZITHROMAX) 500 mg in sodium chloride 0.9 % 250 mL IVPB  Status:  Discontinued        500 mg 250 mL/hr over 60 Minutes Intravenous Every 24 hours 06/03/20 1322 06/06/20 1319   06/03/20 1400  cefTRIAXone (ROCEPHIN) 1 g in sodium chloride 0.9 % 100 mL IVPB  Status:  Discontinued        1 g 200 mL/hr over 30 Minutes Intravenous Every 24 hours 06/03/20 1322 06/06/20 1319       Significant Events: From Dr. Patsey Berthold note on 9/25: "9/20 TEE performed Tee revealed no evidence of endocartidits. Procedure complicated by hypotension and bradycaradia with heart rates dropping to mid 20's and blood pressure systolic to 40X. Received 1 amp of atropine, amp of epinephrine and neosynephrine. Pt intubated to protect air way. Stat head ct and chest ct were unremarkable. Pt son notified immediately and has been updated with care. 9/21 remains on vent, plan for SAT/SBT 9/22 plan for OR today  MRI BRAIN-IMPRESSION: 1. No acute intracranial abnormality. 2. Few small remote lacunar infarcts involving the pons. 3. Otherwise normal brain MRI for age. 9/23 HD performed patient extubated after successful SAT/SBT 9/24 uneventful day,  still  somewhat encephalopathic but clearing"  Objective   Vitals:   06/21/20 0504 06/21/20 0852 06/21/20 1632 06/21/20 1636  BP: (!) 149/53  (!) 188/88   Pulse: (!) 58 62 (!) 58 61  Resp: 15  16   Temp: 98.3 F (36.8 C)  98.3 F (36.8 C)   TempSrc: Oral  Oral   SpO2: 100%  100%   Weight:      Height:        Intake/Output Summary (Last 24 hours) at 06/21/2020 1845 Last data filed at 06/21/2020 0400 Gross per 24 hour  Intake 480 ml  Output --  Net 480 ml   Filed Weights   06/14/20 0500 06/15/20 0417 06/19/20 1422  Weight: 82.2 kg 81.4 kg 81.4 kg    Physical Exam:  Constitutional: NAD, AAOx3, sitting up eating  HEENT: conjunctivae and lids normal, EOMI CV: No cyanosis.   RESP: normal respiratory effort, on RA Extremities: No effusions, edema in BLE SKIN: warm, dry and intact Neuro: II - XII grossly intact.   Psych: belligerent mood and affect.     Labs   Data Reviewed: I have personally reviewed following labs and imaging studies  CBC: Recent Labs  Lab 06/16/20 0545 06/17/20 0628 06/18/20 1726 06/20/20 0634 06/21/20 0644  WBC 13.9* 7.3 6.2 4.4 3.7*  HGB 7.9* 7.4* 8.2* 8.2* 7.7*  HCT 23.3* 21.9* 25.3* 24.4* 22.3*  MCV 87.6 86.2 88.2 85.9 85.4  PLT 181 190 216 183 757   Basic Metabolic Panel: Recent Labs  Lab 06/15/20 0505 06/15/20 0505 06/16/20 0545 06/16/20 0545 06/17/20 0620 06/17/20 0620 06/17/20 1957 06/18/20 1726 06/19/20 0707 06/20/20 0634 06/21/20 0644  NA 136   < > 135   < > 136  --   --  134*  135 135 135 138  K 3.0*   < > 3.1*   < > 2.9*   < > 3.8 3.9  4.0 3.9 4.1 4.0  CL 96*   < > 96*   < > 98  --   --  96*  96* 97* 99 100  CO2 29   < > 28   < > 27  --   --  26  27 25 25 27   GLUCOSE 85   < > 113*   < > 105*  --   --  92  95 63* 83 86  BUN 13   < > 21   < > 13  --   --  22  23 16 22 12   CREATININE 3.96*   < > 4.84*   < > 3.40*  --   --  4.77*  4.83* 3.92* 4.99* 3.49*  CALCIUM 7.0*   < > 7.6*   < > 8.0*  --   --  8.4*  8.5* 8.4*  8.1* 8.2*  MG 1.5*   < > 1.7   < > 1.8  --   --  1.9 2.1 2.0 1.9  PHOS 3.2  --  4.3  --  2.8  --   --  3.5 3.1  --   --    < > = values in this interval not displayed.   GFR: Estimated Creatinine Clearance: 17.9 mL/min (A) (by C-G formula based on SCr of 3.49 mg/dL (H)). Liver Function Tests: Recent Labs  Lab 06/15/20 0505 06/16/20 0545 06/17/20 0620 06/18/20 1726 06/19/20 0707  ALBUMIN 2.0* 2.2* 2.3* 2.3* 2.3*   No results for input(s): LIPASE, AMYLASE in the last 168 hours.  No results for input(s): AMMONIA in the last 168 hours. Coagulation Profile: No results for input(s): INR, PROTIME in the last 168 hours. Cardiac Enzymes: No results for input(s): CKTOTAL, CKMB, CKMBINDEX, TROPONINI in the last 168 hours. BNP (last 3 results) No results for input(s): PROBNP in the last 8760 hours. HbA1C: No results for input(s): HGBA1C in the last 72 hours. CBG: Recent Labs  Lab 06/21/20 0500 06/21/20 0804 06/21/20 1212 06/21/20 1546 06/21/20 1645  GLUCAP 101* 92 63* 72 67*   Lipid Profile: No results for input(s): CHOL, HDL, LDLCALC, TRIG, CHOLHDL, LDLDIRECT in the last 72 hours. Thyroid Function Tests: No results for input(s): TSH, T4TOTAL, FREET4, T3FREE, THYROIDAB in the last 72 hours. Anemia Panel: No results for input(s): VITAMINB12, FOLATE, FERRITIN, TIBC, IRON, RETICCTPCT in the last 72 hours. Sepsis Labs: No results for input(s): PROCALCITON, LATICACIDVEN in the last 168 hours.  Recent Results (from the past 240 hour(s))  Aerobic/Anaerobic Culture (surgical/deep wound)     Status: None   Collection Time: 06/13/20  2:23 PM   Specimen: Wound; Abscess  Result Value Ref Range Status   Specimen Description   Final    WOUND Performed at [facility], [address], [place], [place] [address]    Special Requests   Final    RIGHT ARM PERI GRAFT ABSCESS Performed at [facility], [place], [place] [address]    Gram Stain NO WBC  SEEN NO ORGANISMS SEEN   Final   Culture   Final    RARE ENTEROCOCCUS FAECALIS NO ANAEROBES ISOLATED Performed at [facility], [place] [address], [place], [place] [address]    Report Status 06/18/2020 FINAL  Final   Organism ID, Bacteria ENTEROCOCCUS FAECALIS  Final      Susceptibility   Enterococcus faecalis - MIC*    AMPICILLIN <=2 SENSITIVE Sensitive     VANCOMYCIN 1 SENSITIVE Sensitive     GENTAMICIN SYNERGY SENSITIVE Sensitive     * RARE ENTEROCOCCUS FAECALIS      Imaging Studies   No results found.   Medications   Scheduled Meds: . aspirin EC  81 mg Oral Daily  . atorvastatin  20 mg Oral Daily  . carvedilol  6.25 mg Oral BID WC  . chlorhexidine gluconate (MEDLINE KIT)  15 mL Mouth Rinse BID  . Chlorhexidine Gluconate Cloth  6 each Topical Daily  . cholestyramine light  4 g Oral BID  . dextrose  1 ampule Intravenous Once  . epoetin (EPOGEN/PROCRIT) injection  10,000 Units Intravenous Q M,W,F-HD  . [START ON 06/22/2020] feeding supplement (ENSURE ENLIVE)  237 mL Oral Q24H  . ferrous sulfate  325 mg Oral Daily  . gabapentin  100 mg Oral QPM  . heparin  5,000 Units Subcutaneous Q8H  . hydrALAZINE  25 mg Oral Q8H  . hydrOXYzine  25 mg Oral Daily  . lipase/protease/amylase  12,000 Units Oral TID WC  . multivitamin  1 tablet Oral Daily  . pantoprazole (PROTONIX) IV  40 mg Intravenous Q24H  . cyanocobalamin  1,000 mcg Oral Daily   Continuous Infusions: . sodium chloride 200 mL (06/21/20 1151)  . ampicillin (OMNIPEN) IV 2 g (06/21/20 1153)       LOS: 18 days     Enzo Bi, MD [facility]  06/21/2020, 6:45 PM

## 2020-06-21 NOTE — Progress Notes (Signed)
ID  Sitting on the edge of bed and having lunch Says she is cold o/e  BP (!) 149/53 (BP Location: Left Arm)   Pulse 62   Temp 98.3 F (36.8 C) (Oral)   Resp 15   Ht 5\' 5"  (1.651 m)   Wt 81.4 kg   SpO2 100%   BMI 29.86 kg/m   Awake ,  Chronically ill Pale Rt surgical site looks okay- ecchymosis Chest b/l air entry Hss1s2  LAbs  CBC Latest Ref Rng & Units 06/21/2020 06/20/2020 06/18/2020  WBC 4.0 - 10.5 K/uL 3.7(L) 4.4 6.2  Hemoglobin 12.0 - 15.0 g/dL 7.7(L) 8.2(L) 8.2(L)  Hematocrit 36 - 46 % 22.3(L) 24.4(L) 25.3(L)  Platelets 150 - 400 K/uL 154 183 216     CMP Latest Ref Rng & Units 06/21/2020 06/20/2020 06/19/2020  Glucose 70 - 99 mg/dL 86 83 63(L)  BUN 8 - 23 mg/dL 12 22 16   Creatinine 0.44 - 1.00 mg/dL 3.49(H) 4.99(H) 3.92(H)  Sodium 135 - 145 mmol/L 138 135 135  Potassium 3.5 - 5.1 mmol/L 4.0 4.1 3.9  Chloride 98 - 111 mmol/L 100 99 97(L)  CO2 22 - 32 mmol/L 27 25 25   Calcium 8.9 - 10.3 mg/dL 8.2(L) 8.1(L) 8.4(L)  Total Protein 6.5 - 8.1 g/dL - - -  Total Bilirubin 0.3 - 1.2 mg/dL - - -  Alkaline Phos 38 - 126 U/L - - -  AST 15 - 41 U/L - - -  ALT 0 - 44 U/L - - -    Impression/recommendation Enterococcus bacteremia with endovascular infection due to infected Hero graft which has been removed on 06/13/20 TEE negative and repeat blood culture from 9/16 negative Will need a total of 4 weeks of vancomycin until 07/11/20 - check weekly CBC/CMP/vanc level Currently on ampicillin- change to vancomycin as it can be given during dialysis  ESRD on dialysis  Anemia  Decreasing WBC-   HTN- on  carvedilol  DM with gastroparesis  Discussed the management with care team

## 2020-06-21 NOTE — Anesthesia Postprocedure Evaluation (Signed)
Anesthesia Post Note  Patient: [name]  Procedure(s) Performed: DIALYSIS/PERMA CATHETER INSERTION (N/A )  Patient location during evaluation: PACU Anesthesia Type: General Level of consciousness: awake Pain management: pain level controlled Vital Signs Assessment: post-procedure vital signs reviewed and stable Respiratory status: spontaneous breathing, nonlabored ventilation, respiratory function stable and patient connected to nasal cannula oxygen Cardiovascular status: blood pressure returned to baseline and stable Postop Assessment: no apparent nausea or vomiting Anesthetic complications: no   No complications documented.   Last Vitals:  Vitals:   06/20/20 2005 06/21/20 0504  BP: (!) 165/63 (!) 149/53  Pulse: (!) 57 (!) 58  Resp: 16 15  Temp: 36.7 C 36.8 C  SpO2: 100% 100%    Last Pain:  Vitals:   06/21/20 0504  TempSrc: Oral  PainSc:                  Martha Clan

## 2020-06-21 NOTE — Progress Notes (Signed)
Nutrition Follow-up  DOCUMENTATION CODES:   Not applicable  INTERVENTION:  CIB po daily with 237 ml Lactaid milk on breakfast tray provides 260 kcal, and 13 grams of protein  Decrease Ensure to once daily  Continue Renavit daily  NUTRITION DIAGNOSIS:   Inadequate oral intake related to inability to eat as evidenced by NPO status. -progressing; diet advanced to regelar  GOAL:   Patient will meet greater than or equal to 90% of their needs -progressing  MONITOR:   PO intake, Supplement acceptance, Labs, Weight trends, Skin, I & O's  REASON FOR ASSESSMENT:   Ventilator    ASSESSMENT:   59 year old female with PMHx of asthma, DM, CAD, ESRD on HD, hx MI, mitral regurgitation, COPD, CHF, anxiety, depression, hx hiatal hernia, GERD, gastroparesis, arthritis, pancreatis insufficiency admitted with enterococcal bacteremia.  Pt sitting up on side of bed, nursing staff in room to assist pt with bathing. Pt states that she would be doing a whole lot better if she had a joint. She endorsed decreased meal intake d/t dislike of facility food, states reported lactose allergy has been taken too seriously. She reports lactose intolerance only when drinking milk, endorsed a refrigerator full of cheese, butter, and yogurt that she eats all the time. Pt pleased with receiving Lactaid milk on breakfast tray, requested real butter instead of margarine as well as yogurt. Pt does not have lactose allergy listed and preferences previously updated in [facility]. RD discussed the importance of nutrition, encouraged po intake of meals/supplments. Pt denied Nepro, states it goes right through her, but tolerates about half of an Ensure with crackers. She is on a regular diet, RD suggested CIB with Lactaid as alternate supplement, pt agreeable to trying. Will send on breakfast tray daily.  Medications reviewed and include: Ferrous sulfate, Gabapentin, Creon, Renavit, B12 Labs: CBGs [phone], Cr 3.49  (H), K 4.0 (WNL), Hgb 7.7 (L), HCT 22.3 (L)   Diet Order:   Diet Order            Diet regular Room service appropriate? Yes; Fluid consistency: Thin  Diet effective now                 EDUCATION NEEDS:   No education needs have been identified at this time  Skin:  Skin Assessment: Skin Integrity Issues: (closed incision left arm)  Last BM:  06/14/2020 - type 6  Height:   Ht Readings from Last 1 Encounters:  06/19/20 5\' 5"  (1.651 m)    Weight:   Wt Readings from Last 1 Encounters:  06/19/20 81.4 kg    Ideal Body Weight:  56.8 kg  BMI:  Body mass index is 29.86 kg/m.  Estimated Nutritional Needs:   Kcal:  1800-2100kcal/day  Protein:  90-105g/day  Fluid:  UOP + 1 L   Lajuan Lines, RD, LDN Clinical Nutrition After Hours/Weekend Pager # in [place]

## 2020-06-21 NOTE — Evaluation (Signed)
Physical Therapy Re-Evaluation Patient Details Name: RUSHIE BRAZEL MRN: [id number] DOB: [birth year] Today's Date: 06/21/2020   History of Present Illness  59 y.o. female admitted on 06/02/2020 with respiratory failure secondary to acute diastolic CHF and COPD exacerbation. PMH includes ESRD on HD, HTN, DM, CAD, PCD.  Patient with ESRD on HD with numerous graft complications. Permcath placed and temporary femoral cathetar removed  Clinical Impression  Pt was lethargic and nauseous upon PT arrival but agreed to session after coaxing from PT. Pt was able to come from supine to sitting with SBA. PT attempted to help with bed mobility but pt declined and was able to complete with increased time and effort. Pt was able to sit for tolerance at EOB with no loss of balance for 3 minutes. Pt displays significant pain but vitals WFL. Pt performed STS with ModA, increased time, significantly increased effort, and moderate cueing. Pt able to come fully to standing but soon needed to sit back down secondary to nausea and fatigue. Pt requested to be left sitting EOB to help with nauseous and nursing came into the room to help with medications. Pt will benefit from PT services in a SNF setting upon discharge to safely address deficits listed in patient problem list for decreased caregiver assistance and eventual return to PLOF.     Follow Up Recommendations SNF    Equipment Recommendations  None recommended by PT    Recommendations for Other Services       Precautions / Restrictions Precautions Precautions: Fall Restrictions Weight Bearing Restrictions: No      Mobility  Bed Mobility Overal bed mobility: Needs Assistance Bed Mobility: Rolling;Supine to Sit Rolling: Min assist   Supine to sit: Supervision     General bed mobility comments: PT attempt to help for supine to sit but pt refused and sat up without hands on assist with increased time  Transfers Overall transfer level: Needs  assistance Equipment used: Rolling walker (2 wheeled) Transfers: Sit to/from Stand Sit to Stand: Mod assist         General transfer comment: pt needs increased time, effort, and cueing to complete transfer. pt displays significant pain while coming to stand  Ambulation/Gait             General Gait Details: not attempted due to tolerence  Stairs            Wheelchair Mobility    Modified Rankin (Stroke Patients Only)       Balance Overall balance assessment: Needs assistance Sitting-balance support: Bilateral upper extremity supported;Feet unsupported Sitting balance-Leahy Scale: Good Sitting balance - Comments: pt able to maintain quiet sitting on EOB for 3 minutes during session   Standing balance support: Bilateral upper extremity supported Standing balance-Leahy Scale: Fair Standing balance comment: pt able to stand with bilateral UE assist but needs to sit secondary to fatigue and pain                             Pertinent Vitals/Pain Faces Pain Scale: Hurts whole lot Pain Location: rib cage and low back Pain Descriptors / Indicators: Discomfort;Aching Pain Intervention(s): Limited activity within patient's tolerance;Monitored during session;Repositioned    Home Living Family/patient expects to be discharged to:: Private residence Living Arrangements: Alone Available Help at Discharge: Family;Available PRN/intermittently Type of Home: Apartment Home Access: Level entry     Home Layout: One level Home Equipment: Walker - 4 wheels;Cane - single point;Cane - quad;Wheelchair -  manual;Toilet riser;Walker - 2 wheels      Prior Function Level of Independence: Independent with assistive device(s)         Comments: Family available PRN. Is very close with elderly neighbor     Hand Dominance   Dominant Hand: Left    Extremity/Trunk Assessment                Communication   Communication: No difficulties  Cognition  Arousal/Alertness: Lethargic Behavior During Therapy: WFL for tasks assessed/performed Overall Cognitive Status: Difficult to assess (pt lethargic and sleepy but [facility] for what was assessed)                     Current Attention Level: Sustained   Following Commands: Follows one step commands consistently   Awareness: Intellectual          General Comments      Exercises Other Exercises Other Exercises: pt educated on the importance of mobility on physiology   Assessment/Plan    PT Assessment Patient needs continued PT services  PT Problem List Decreased mobility;Decreased activity tolerance;Pain;Decreased strength;Decreased balance;Decreased knowledge of use of DME       PT Treatment Interventions Therapeutic activities;Gait training;Therapeutic exercise;Balance training;Functional mobility training;Patient/family education    PT Goals (Current goals can be found in the Care Plan section)  Acute Rehab PT Goals Patient Stated Goal: to get stonger and go home PT Goal Formulation: With patient Time For Goal Achievement: 07/04/20 Potential to Achieve Goals: Fair    Frequency Min 2X/week   Barriers to discharge        Co-evaluation               AM-PAC PT "6 Clicks" Mobility  Outcome Measure Help needed turning from your back to your side while in a flat bed without using bedrails?: A Little Help needed moving from lying on your back to sitting on the side of a flat bed without using bedrails?: A Little Help needed moving to and from a bed to a chair (including a wheelchair)?: A Lot Help needed standing up from a chair using your arms (e.g., wheelchair or bedside chair)?: A Lot Help needed to walk in hospital room?: A Lot Help needed climbing 3-5 steps with a railing? : Total 6 Click Score: 13    End of Session Equipment Utilized During Treatment: Gait belt Activity Tolerance: Patient limited by fatigue;Patient limited by pain Patient left: with  nursing/sitter in room;with bed alarm set;in bed;Other (comment) (pt left sitting up EOB with bed alarm as per pt request. nurse notified) Nurse notified that pt was nauseous and wanted medication  Nurse Communication: Mobility status PT Visit Diagnosis: Muscle weakness (generalized) (M62.81);Difficulty in walking, not elsewhere classified (R26.2)    Time: 9323-5573 PT Time Calculation (min) (ACUTE ONLY): 45 min   Charges:              Hervey Ard, SPT 06/21/20. 3:24 PM

## 2020-06-21 NOTE — Progress Notes (Signed)
[facility], [place] 06/21/20  Subjective:   LOS: 18  Patient sitting up at the side of the bed, c/o generalized weakness and inability to do ADLs by herself. No acute respiratory distress noted.No c/o nausea or vomiting.  Objective:  Vital signs in last 24 hours:  Temp:  [98 F (36.7 C)-98.3 F (36.8 C)] 98.3 F (36.8 C) (09/30 0504) Pulse Rate:  [56-62] 62 (09/30 0852) Resp:  [10-16] 15 (09/30 0504) BP: (146-187)/(53-92) 149/53 (09/30 0504) SpO2:  [96 %-100 %] 100 % (09/30 0504)  Weight change:  Filed Weights   06/14/20 0500 06/15/20 0417 06/19/20 1422  Weight: 82.2 kg 81.4 kg 81.4 kg    Intake/Output:    Intake/Output Summary (Last 24 hours) at 06/21/2020 1140 Last data filed at 06/21/2020 0400 Gross per 24 hour  Intake 480 ml  Output 1500 ml  Net -1020 ml     Physical Exam: General:  Siting up at the side of the bed, in no acute distress  HEENT   Oral mucous membranes moist  Pulm/lungs  Lungs clear bilaterally  CVS/Heart  S1S2 no rub or gallop  Abdomen:   Soft,non distended  Extremities:  No peripheral edema  Neurologic:  Alert, awake, answers questions appropriately  Skin:  No acute rashes or lesions  Access:  Lt Chest Permcath       Basic Metabolic Panel:  Recent Labs  Lab 06/15/20 0505 06/15/20 0505 06/16/20 0545 06/16/20 0545 06/17/20 0620 06/17/20 0620 06/17/20 1957 06/18/20 1726 06/18/20 1726 06/19/20 0707 06/20/20 0634 06/21/20 0644  NA 136   < > 135   < > 136  --   --  134*  135  --  135 135 138  K 3.0*   < > 3.1*   < > 2.9*   < > 3.8 3.9  4.0  --  3.9 4.1 4.0  CL 96*   < > 96*   < > 98  --   --  96*  96*  --  97* 99 100  CO2 29   < > 28   < > 27  --   --  26  27  --  25 25 27   GLUCOSE 85   < > 113*   < > 105*  --   --  92  95  --  63* 83 86  BUN 13   < > 21   < > 13  --   --  22  23  --  16 22 12   CREATININE 3.96*   < > 4.84*   < > 3.40*  --   --  4.77*  4.83*  --  3.92* 4.99* 3.49*  CALCIUM 7.0*    < > 7.6*   < > 8.0*   < >  --  8.4*  8.5*   < > 8.4* 8.1* 8.2*  MG 1.5*   < > 1.7   < > 1.8  --   --  1.9  --  2.1 2.0 1.9  PHOS 3.2  --  4.3  --  2.8  --   --  3.5  --  3.1  --   --    < > = values in this interval not displayed.     CBC: Recent Labs  Lab 06/16/20 0545 06/17/20 0628 06/18/20 1726 06/20/20 0634 06/21/20 0644  WBC 13.9* 7.3 6.2 4.4 3.7*  HGB 7.9* 7.4* 8.2* 8.2* 7.7*  HCT 23.3* 21.9* 25.3* 24.4* 22.3*  MCV  87.6 86.2 88.2 85.9 85.4  PLT 181 190 216 183 154      Lab Results  Component Value Date   HEPBSAG NON REACTIVE 04/14/2020   HEPBIGM  06/12/2010    NEGATIVE (NOTE) High levels of Hepatitis B Core IgM antibody are detectable during the acute stage of Hepatitis B. This antibody is used to differentiate current from past HBV infection.       Microbiology:  Recent Results (from the past 240 hour(s))  Aerobic/Anaerobic Culture (surgical/deep wound)     Status: None   Collection Time: 06/13/20  2:23 PM   Specimen: Wound; Abscess  Result Value Ref Range Status   Specimen Description   Final    WOUND Performed at [facility], [address], [place], [place] [address]    Special Requests   Final    RIGHT ARM PERI GRAFT ABSCESS Performed at [facility], [place], [place] [address]    Gram Stain NO WBC SEEN NO ORGANISMS SEEN   Final   Culture   Final    RARE ENTEROCOCCUS FAECALIS NO ANAEROBES ISOLATED Performed at [facility], [place] [address] [place],  [address]    Report Status 06/18/2020 FINAL  Final   Organism ID, Bacteria ENTEROCOCCUS FAECALIS  Final      Susceptibility   Enterococcus faecalis - MIC*    AMPICILLIN <=2 SENSITIVE Sensitive     VANCOMYCIN 1 SENSITIVE Sensitive     GENTAMICIN SYNERGY SENSITIVE Sensitive     * RARE ENTEROCOCCUS FAECALIS    Coagulation Studies: No results for input(s): LABPROT, INR in the last 72 hours.  Urinalysis: No results for input(s): COLORURINE,  LABSPEC, PHURINE, GLUCOSEU, HGBUR, BILIRUBINUR, KETONESUR, PROTEINUR, UROBILINOGEN, NITRITE, LEUKOCYTESUR in the last 72 hours.  Invalid input(s): APPERANCEUR    Imaging: PERIPHERAL VASCULAR CATHETERIZATION  Result Date: 06/19/2020 See op note    Medications:   . sodium chloride Stopped (06/19/20 1638)  . ampicillin (OMNIPEN) IV Stopped (06/20/20 2127)   . aspirin EC  81 mg Oral Daily  . atorvastatin  20 mg Oral Daily  . carvedilol  6.25 mg Oral BID WC  . chlorhexidine gluconate (MEDLINE KIT)  15 mL Mouth Rinse BID  . Chlorhexidine Gluconate Cloth  6 each Topical Daily  . cholestyramine light  4 g Oral BID  . dextrose  1 ampule Intravenous Once  . epoetin (EPOGEN/PROCRIT) injection  10,000 Units Intravenous Q M,W,F-HD  . [START ON 06/22/2020] feeding supplement (ENSURE ENLIVE)  237 mL Oral Q24H  . ferrous sulfate  325 mg Oral Daily  . gabapentin  100 mg Oral QPM  . heparin  5,000 Units Subcutaneous Q8H  . hydrALAZINE  25 mg Oral Q8H  . hydrOXYzine  25 mg Oral Daily  . lipase/protease/amylase  12,000 Units Oral TID WC  . multivitamin  1 tablet Oral Daily  . pantoprazole (PROTONIX) IV  40 mg Intravenous Q24H  . cyanocobalamin  1,000 mcg Oral Daily   sodium chloride, acetaminophen **OR** acetaminophen, calcium carbonate, camphor-menthol **AND** hydrOXYzine, haloperidol lactate, hydrALAZINE, ipratropium-albuterol, loperamide, naLOXone (NARCAN)  injection, ondansetron **OR** [DISCONTINUED] ondansetron (ZOFRAN) IV, oxyCODONE-acetaminophen, promethazine, sorbitol, zolpidem  Assessment/ Plan:  59 y.o. female was admitted on 06/02/2020 for  Principal Problem:   Bacteremia due to Enterococcus Active Problems:   Acute respiratory failure with hypoxia (HCC)   Anemia of chronic disease   Acute diastolic CHF (congestive heart failure) (HCC)   COPD (chronic obstructive pulmonary disease) ([place])   ESRD on hemodialysis (  [place])   Benign essential HTN   Obesity (BMI 30-39.9)  ESRD (end  stage renal disease) ([place]) [N18.6] CAP (community acquired pneumonia) [J18.9] ESRD on dialysis ([place]) [N18.6, Z99.2] Dyspnea, unspecified type [R06.00] Acute on chronic congestive heart failure, unspecified heart failure type ([place]) [I50.9]  #. ESRD Volume and electrolyte status acceptable, no need for dialysis today Will continue  MWF schedule  #. Anemia of CKD  Lab Results  Component Value Date   HGB 7.7 (L) 06/21/2020   Continue  Epogen 10,000 units with HD MWF Will continue monitoring CBCs  #. Secondary hyperparathyroidism of renal origin N 25.81      Component Value Date/Time   PTH 357 (H) 10/05/2018 1241   Lab Results  Component Value Date   PHOS 3.1 06/19/2020   Monitor calcium and phos level during this admission  #. Diabetes type 2 with CKD Hemoglobin A1C (%)  Date Value  07/19/2013 9.3 (H)   Hgb A1c MFr Bld (%)  Date Value  05/03/2020 5.7 (H)    #Enterococcal bacteremia TEE Negative for endocarditis Plan to start vancomycin post dialysis for 4 weeks per ID recommendation.    LOS: [place] 9/30/202111:40 AM  [place], [place]   \

## 2020-06-22 LAB — GLUCOSE, CAPILLARY
Glucose-Capillary: 59 mg/dL — ABNORMAL LOW (ref 70–99)
Glucose-Capillary: 64 mg/dL — ABNORMAL LOW (ref 70–99)
Glucose-Capillary: 72 mg/dL (ref 70–99)
Glucose-Capillary: 73 mg/dL (ref 70–99)
Glucose-Capillary: 83 mg/dL (ref 70–99)
Glucose-Capillary: 92 mg/dL (ref 70–99)

## 2020-06-22 LAB — BASIC METABOLIC PANEL
Anion gap: 10 (ref 5–15)
BUN: 15 mg/dL (ref 8–23)
CO2: 25 mmol/L (ref 22–32)
Calcium: 8.4 mg/dL — ABNORMAL LOW (ref 8.9–10.3)
Chloride: 100 mmol/L (ref 98–111)
Creatinine, Ser: 4.44 mg/dL — ABNORMAL HIGH (ref 0.44–1.00)
GFR calc Af Amer: 12 mL/min — ABNORMAL LOW (ref >60)
GFR calc non Af Amer: 10 mL/min — ABNORMAL LOW (ref >60)
Glucose, Bld: 60 mg/dL — ABNORMAL LOW (ref 70–99)
Potassium: 4 mmol/L (ref 3.5–5.1)
Sodium: 135 mmol/L (ref 135–145)

## 2020-06-22 LAB — CBC
HCT: 23.3 % — ABNORMAL LOW (ref 36.0–46.0)
Hemoglobin: 7.6 g/dL — ABNORMAL LOW (ref 12.0–15.0)
MCH: 28.9 pg (ref 26.0–34.0)
MCHC: 32.6 g/dL (ref 30.0–36.0)
MCV: 88.6 fL (ref 80.0–100.0)
Platelets: 172 10*3/uL (ref 150–400)
RBC: 2.63 MIL/uL — ABNORMAL LOW (ref 3.87–5.11)
RDW: 17.9 % — ABNORMAL HIGH (ref 11.5–15.5)
WBC: 3.9 10*3/uL — ABNORMAL LOW (ref 4.0–10.5)
nRBC: 0 % (ref 0.0–0.2)

## 2020-06-22 LAB — MAGNESIUM: Magnesium: 2.1 mg/dL (ref 1.7–2.4)

## 2020-06-22 MED ORDER — VANCOMYCIN HCL 750 MG/150ML IV SOLN: 750.0000 mg | INTRAVENOUS | Status: DC

## 2020-06-22 MED ORDER — GLUCOSE 40 % PO GEL
ORAL | Status: AC
  Administered 2020-06-22: 37.5 g
  Filled 2020-06-22: qty 1

## 2020-06-22 MED ORDER — VANCOMYCIN HCL 1500 MG/300ML IV SOLN
1500.0000 mg | Freq: Once | INTRAVENOUS | Status: AC
  Administered 2020-06-22: 1500 mg via INTRAVENOUS
  Filled 2020-06-22: qty 300

## 2020-06-22 MED ORDER — PANTOPRAZOLE SODIUM 40 MG PO TBEC
40.0000 mg | DELAYED_RELEASE_TABLET | Freq: Every day | ORAL | Status: DC
  Administered 2020-06-22 – 2020-06-24 (×3): 40 mg via ORAL
  Filled 2020-06-22 (×3): qty 1

## 2020-06-22 NOTE — NC FL2 (Deleted)
[place] LEVEL OF CARE SCREENING TOOL     IDENTIFICATION  Patient Name: [name] Birthdate: 10/14/1960 Sex: female Admission Date (Current Location): 06/02/2020  [place] and [place] Number:  Engineering geologist and Address:  [facility], [address] [place],  [address]      Provider Number: [id number]  Attending Physician Name and Address:  Enzo Bi, MD  Relative Name and Phone Number:       Current Level of Care: Hospital Recommended Level of Care: [place] Prior Approval Number:    Date Approved/Denied:   PASRR Number: [id number]  Discharge Plan: SNF    Current Diagnoses: Patient Active Problem List   Diagnosis Date Noted  . Obesity (BMI 30-39.9) 06/06/2020  . Anemia 05/31/2020  . Type 1 diabetes mellitus ([place]) 05/31/2020  . End-stage renal disease ([place]) 05/31/2020  . Hypertensive disorder 05/31/2020  . Arm pain 05/21/2020  . Cellulitis of right leg 05/02/2020  . ESRD (end stage renal disease) ([place]) 04/13/2020  . SOBOE (shortness of breath on exertion) 02/08/2020  . Hematoma of arm, left, subsequent encounter 11/24/2019  . Respiratory failure ([place])   . Shortness of breath   . Complication of arteriovenous dialysis fistula 11/13/2019  . PVD (peripheral vascular disease) ([place]) 11/13/2019  . Severe anemia 11/13/2019  . Pressure injury of skin 11/13/2019  . ESRD on hemodialysis ([place])   . Hemorrhagic shock ([place]) 11/11/2019  . Leg pain 10/02/2019  . CAP (community acquired pneumonia) 10/01/2019  . Atherosclerosis of native arteries of extremity with intermittent claudication ([place]) 05/01/2019  . Right-sided headache   . COPD (chronic obstructive pulmonary disease) ([place]) 10/06/2018  . Syncope 10/04/2018  . Symptomatic anemia 06/18/2018  . Gastroparesis due to DM ([place]) 01/18/2018  . Complication of vascular access for dialysis 12/04/2017  . Osteomyelitis ([place]) 09/30/2017  . Carotid stenosis  06/18/2017  . Bilateral carotid artery stenosis 06/16/2017  . Benign essential HTN 05/19/2017  . CKD (chronic kidney disease) stage 5, GFR less than 15 ml/min (HCC) 05/19/2017  . Hyperlipidemia, mixed 05/19/2017  . Shortness of breath 05/04/2017  . Rash, skin 12/16/2016  . Cellulitis of lower extremity 07/29/2016  . Chronic venous insufficiency 07/29/2016  . Lymphedema 07/29/2016  . TIA (transient ischemic attack) 04/21/2016  . Altered mental status 04/08/2016  . Hyperammonemia ([place]) 04/08/2016  . Elevated troponin 04/08/2016  . Depression 04/08/2016  . Depression, major, recurrent, severe with psychosis ([place]) 04/08/2016  . Blood in stool   . Intractable cyclical vomiting with nausea   . Reflux esophagitis   . Gastritis   . Generalized abdominal pain   . Uncontrollable vomiting   . Major depressive disorder, recurrent episode, moderate ([place]) 03/15/2016  . Adjustment disorder with mixed anxiety and depressed mood 03/15/2016  . Malnutrition of moderate degree 12/01/2015  . Renal mass   . Dyspnea   . Acute renal failure ([place])   . Respiratory failure ([place])   . High temperature 11/14/2015  . Encounter for central line placement   . Encounter for orogastric (OG) tube placement   . Nausea 11/12/2015  . Hyperkalemia 10/03/2015  . Diarrhea, unspecified 07/22/2015  . Pneumonia 05/21/2015  . Hypoglycemia 04/24/2015  . Unresponsiveness 04/24/2015  . Bradycardia 04/24/2015  . Hypothermia 04/24/2015  . Acute respiratory failure ([place]) 04/24/2015  . Acute diastolic CHF (congestive heart failure) ([place]) 04/05/2015  . Diabetic gastroparesis ([place]) 04/05/2015  . Hypokalemia 04/05/2015  . Generalized weakness 04/05/2015  . Acute pulmonary edema ([place]) 04/03/2015  .  Nausea and vomiting 04/03/2015  . Hypoglycemia associated with diabetes ([place]) 04/03/2015  . Anemia of chronic disease 04/03/2015  . Secondary hyperparathyroidism ([place]) 04/03/2015  . Pressure ulcer 04/02/2015  . Acute  respiratory failure with hypoxia ([place]) 04/01/2015  . Adjustment disorder with anxiety 03/14/2015  . Somatic symptom disorder, mild 03/08/2015  . Coronary artery disease involving native coronary artery of native heart without angina pectoris   . Nausea & vomiting 03/06/2015  . Abdominal pain 03/06/2015  . DM (diabetes mellitus) ([place]) 03/06/2015  . HTN (hypertension) 03/06/2015  . Gastroparesis 02/24/2015  . Pleural effusion 02/19/2015  . Bacteremia due to Enterococcus 02/19/2015  . End-stage renal disease on hemodialysis ([place]) 02/19/2015  . Diarrhea 08/01/2014    Orientation RESPIRATION BLADDER Height & Weight     Self, Time, Situation, Place  Normal Continent Weight: 182 lb 8.7 oz (82.8 kg) Height:  5' 5" (165.1 cm)  BEHAVIORAL SYMPTOMS/MOOD NEUROLOGICAL BOWEL NUTRITION STATUS      Continent Diet (see dc summary)  AMBULATORY STATUS COMMUNICATION OF NEEDS Skin   Extensive Assist Verbally Surgical wounds                       Personal Care Assistance Level of Assistance  Bathing, Feeding, Dressing Bathing Assistance: Limited assistance Feeding assistance: Independent Dressing Assistance: Limited assistance     Functional Limitations Info  Sight, Hearing, Speech Sight Info: Adequate Hearing Info: Adequate Speech Info: Adequate    SPECIAL CARE FACTORS FREQUENCY  PT (By licensed PT), OT (By licensed OT)     PT Frequency: 5x week OT Frequency: 3x week            Contractures Contractures Info: Not present    Additional Factors Info  Code Status, Allergies Code Status Info: Full Allergies Info: Ace Inhibitors, Ativan, Compazine, Sumatriptan Succinate, Zofran, Losartan, Prochlorperazine, Reglan, Scopolamine, Tape, Tapentadol           Current Medications (06/22/2020):  This is the current hospital active medication list Current Facility-Administered Medications  Medication Dose Route Frequency Provider Last Rate Last Admin  . 0.9 %  sodium chloride  infusion   Intravenous PRN Tyler Pita, MD 10 mL/hr at 06/21/20 1151 200 mL at 06/21/20 1151  . acetaminophen (TYLENOL) tablet 650 mg  650 mg Oral Q6H PRN Schnier, Dolores Lory, MD   650 mg at 06/06/20 6045   Or  . acetaminophen (TYLENOL) suppository 650 mg  650 mg Rectal Q6H PRN Schnier, Dolores Lory, MD      . aspirin EC tablet 81 mg  81 mg Oral Daily Katha Cabal, MD   81 mg at 06/22/20 0807  . atorvastatin (LIPITOR) tablet 20 mg  20 mg Oral Daily Schnier, Dolores Lory, MD   20 mg at 06/21/20 2133  . calcium carbonate (TUMS - dosed in mg elemental calcium) chewable tablet 500 mg of elemental calcium  500 mg of elemental calcium Oral Q6H PRN Schnier, Dolores Lory, MD      . camphor-menthol [facility]) lotion 1 application  1 application Topical W0J PRN Schnier, Dolores Lory, MD       And  . hydrOXYzine (ATARAX/VISTARIL) tablet 25 mg  25 mg Oral Q8H PRN Schnier, Dolores Lory, MD   25 mg at 06/20/20 0317  . carvedilol (COREG) tablet 6.25 mg  6.25 mg Oral BID WC Nicole Kindred A, DO   6.25 mg at 06/21/20 1636  . chlorhexidine gluconate (MEDLINE KIT) (PERIDEX) 0.12 % solution 15 mL  15 mL  Mouth Rinse BID Delana Meyer Dolores Lory, MD   15 mL at 06/22/20 0811  . Chlorhexidine Gluconate Cloth 2 % PADS 6 each  6 each Topical Daily Schnier, Dolores Lory, MD   6 each at 06/22/20 [phone]  . cholestyramine light (PREVALITE) packet 4 g  4 g Oral BID Schnier, Dolores Lory, MD   4 g at 06/22/20 0807  . dextrose 50 % solution 50 mL  1 ampule Intravenous Once Schnier, Dolores Lory, MD      . epoetin alfa (EPOGEN) injection 10,000 Units  10,000 Units Intravenous Q M,W,F-HD Murlean Iba, MD   10,000 Units at 06/20/20 1425  . feeding supplement (ENSURE ENLIVE) (ENSURE ENLIVE) liquid 237 mL  237 mL Oral Q24H Enzo Bi, MD      . ferrous sulfate tablet 325 mg  325 mg Oral Daily Schnier, Dolores Lory, MD   325 mg at 06/22/20 0807  . gabapentin (NEURONTIN) capsule 100 mg  100 mg Oral QPM Schnier, Dolores Lory, MD   100 mg at 06/21/20 1636  .  haloperidol lactate (HALDOL) injection 0.5 mg  0.5 mg Intravenous Q6H PRN Tyler Pita, MD      . heparin injection 5,000 Units  5,000 Units Subcutaneous Q8H Schnier, Dolores Lory, MD   5,000 Units at 06/22/20 0526  . hydrALAZINE (APRESOLINE) injection 10 mg  10 mg Intravenous Q4H PRN Schnier, Dolores Lory, MD   10 mg at 06/19/20 1702  . hydrALAZINE (APRESOLINE) tablet 25 mg  25 mg Oral Q8H Griffith, Kelly A, DO   25 mg at 06/21/20 2132  . hydrOXYzine (ATARAX/VISTARIL) tablet 25 mg  25 mg Oral Daily Schnier, Dolores Lory, MD   25 mg at 06/22/20 0807  . ipratropium-albuterol (DUONEB) 0.5-2.5 (3) MG/3ML nebulizer solution 3 mL  3 mL Nebulization Q4H PRN Schnier, Dolores Lory, MD   3 mL at 06/06/20 1715  . lipase/protease/amylase (CREON) capsule 12,000 Units  12,000 Units Oral TID WC Schnier, Dolores Lory, MD   12,000 Units at 06/22/20 [phone]  . loperamide (IMODIUM) capsule 2 mg  2 mg Oral PRN Schnier, Dolores Lory, MD      . multivitamin (RENA-VIT) tablet 1 tablet  1 tablet Oral Daily Schnier, Dolores Lory, MD   1 tablet at 06/22/20 0807  . naloxone [facility]) injection 0.4 mg  0.4 mg Intravenous PRN Delana Meyer Dolores Lory, MD   0.4 mg at 06/05/20 1159  . ondansetron (ZOFRAN) tablet 4 mg  4 mg Oral Q6H PRN Schnier, Dolores Lory, MD      . oxyCODONE-acetaminophen (PERCOCET/ROXICET) 5-325 MG per tablet 1 tablet  1 tablet Oral Q4H PRN Schnier, Dolores Lory, MD   1 tablet at 06/21/20 1155  . pantoprazole (PROTONIX) EC tablet 40 mg  40 mg Oral QHS Dallie Piles, [facility]      . promethazine (PHENERGAN) injection 12.5 mg  12.5 mg Intravenous Q6H PRN Schnier, Dolores Lory, MD   12.5 mg at 06/22/20 0915  . sorbitol 70 % solution 30 mL  30 mL Oral PRN Schnier, Dolores Lory, MD      . vancomycin (VANCOREADY) IVPB 1500 mg/300 mL  1,500 mg Intravenous Once Berton Mount, RPH      . [START ON 07/02/2020] vancomycin (VANCOREADY) IVPB 750 mg/150 mL  750 mg Intravenous Q M,W,F-HD Berton Mount, RPH      . vitamin B-12 (CYANOCOBALAMIN) tablet  1,000 mcg  1,000 mcg Oral Daily Schnier, Dolores Lory, MD   1,000 mcg at 06/22/20 0807  . zolpidem (AMBIEN)  tablet 5 mg  5 mg Oral QHS PRN Schnier, Dolores Lory, MD   5 mg at 06/17/20 2201     Discharge Medications: Please see discharge summary for a list of discharge medications.  Relevant Imaging Results:  Relevant Lab Results:   Additional Information SSN: [id number]  HD [facility] [place] MWF 11:00  Shade Flood, LCSW

## 2020-06-22 NOTE — Anesthesia Preprocedure Evaluation (Signed)
Anesthesia Evaluation  Patient identified by MRN, date of birth, ID band Patient awake    Reviewed: Allergy & Precautions, H&P , NPO status , Patient's Chart, lab work & pertinent test results  History of Anesthesia Complications Negative for: history of anesthetic complications  Airway Mallampati: II  TM Distance: >3 FB     Dental  (+) Edentulous Lower, Edentulous Upper   Pulmonary shortness of breath, neg sleep apnea, COPD, Patient abstained from smoking.,  Intubated on minimal vent settings    + decreased breath sounds      Cardiovascular hypertension, (-) angina+ CAD, + Past MI, + Cardiac Stents, + Peripheral Vascular Disease and +CHF  (-) dysrhythmias  Rhythm:regular Rate:Normal  TTE 06/07/20: 1. Left ventricular ejection fraction, by estimation, is 60 to 65%. The  left ventricle has normal function. The left ventricle has no regional  wall motion abnormalities. There is mild left ventricular hypertrophy.  Left ventricular diastolic parameters  are consistent with Grade I diastolic dysfunction (impaired relaxation).  2. Right ventricular systolic function was not well visualized. The right  ventricular size is mildly enlarged.  3. Right atrial size was mildly dilated.  4. The mitral valve is grossly normal. Trivial mitral valve  regurgitation.  5. Tricuspid valve regurgitation is moderate.  6. The aortic valve is tricuspid. Aortic valve regurgitation is not  visualized.    Neuro/Psych  Headaches, PSYCHIATRIC DISORDERS Anxiety Depression Peripheral neuropathy TIA   GI/Hepatic Neg liver ROS, hiatal hernia, GERD  ,  Endo/Other  diabetesSecondary hyperparathyroidism  Renal/GU ESRF and DialysisRenal disease  negative genitourinary   Musculoskeletal  (+) Arthritis ,   Abdominal   Peds  Hematology  (+) Blood dyscrasia, anemia , Hgb 8.4   Anesthesia Other Findings ESRD on HD, HTN, DM, CAD, COPD, PVD admitted  on 9/11 after coming in with s/sxs concerning for PNA vs sepsis, volume overload and COPD exacerbation. Suffered a cardiac event during TEE 2 days ago (severe bradycardia and hypotension requiring atropine and epinephrine), did not regain consciousness after procedure, intubated and sent to ICU. Imaging has not revealed etiology of altered mental status thus far including negative brain MRI. Now extubated and at baseline mental status  CV: no pressors Resp: PRVC 21% FiO2 Access: PIVs, dialysis catheter  Past Medical History: No date: Anemia No date: Anginal pain ([place]) No date: Anxiety No date: Arthritis No date: Asthma No date: Broken wrist No date: Bronchitis 8/93/8101: chronic diastolic CHF No date: Chronic kidney disease No date: COPD (chronic obstructive pulmonary disease) (HCC) No date: Coronary artery disease     Comment:  a. cath 2013: stenting to RCA (report not available); b.              cath 2014: LM nl, pLAD 40%, mLAD nl, ost LCx 40%, mid LCx              nl, pRCA 30% @ site of prior stent, mRCA 50% No date: Depression No date: Diabetes mellitus ([place]) No date: Diabetes mellitus without complication ([place]) No date: Diabetic neuropathy ([place]) 2006: dialysis No date: Diverticulosis No date: Dizziness No date: Dyspnea No date: Elevated lipids No date: Environmental and seasonal allergies No date: ESRD (end stage renal disease) on dialysis ([place])     Comment:  M-W-F No date: Gastroparesis No date: GERD (gastroesophageal reflux disease) No date: Headache No date: History of anemia due to chronic kidney disease No date: History of hiatal hernia No date: HOH (hard of hearing) 63: Hx of pancreatitis No date:  Hypertension No date: Lower extremity edema No date: Mitral regurgitation     Comment:  a. echo 10/2013: EF 62%, noWMA, mildly dilated LA, mild               to mod MR/TR, GR1DD No date: Myocardial infarction ([place]) No date: Orthopnea No date: Parathyroid  abnormality (HCC) No date: Peripheral arterial disease ([place]) No date: Pneumonia No date: Renal cancer ([place]) No date: Renal insufficiency     Comment:  Pt is on dialysis on M,W + F. No date: Wheezing  Past Surgical History: 01/20/2018: A/V SHUNTOGRAM; Left     Comment:  Procedure: A/V SHUNTOGRAM;  Surgeon: Algernon Huxley, MD;                Location: [facility];  Service: Cardiovascular;              Laterality: Left; 1992: ABDOMINAL HYSTERECTOMY 10/02/2017: AMPUTATION TOE; Left     Comment:  Procedure: AMPUTATION TOE-LEFT GREAT TOE;  Surgeon:               Albertine Patricia, DPM;  Location: [facility];  Service:               Podiatry;  Laterality: Left; No date: APPENDECTOMY 09/27/1094: APPLICATION OF WOUND VAC; N/A     Comment:  Procedure: APPLICATION OF WOUND VAC;  Surgeon: Katha Cabal, MD;  Location: [facility];  Service: Vascular;                Laterality: N/A; 10/11/2018: ARTERY BIOPSY; Right     Comment:  Procedure: BIOPSY TEMPORAL ARTERY;  Surgeon: Vickie Epley, MD;  Location: [facility];  Service: General;                Laterality: Right; 07/26/2015: CARDIAC CATHETERIZATION; Left     Comment:  Procedure: Left Heart Cath and Coronary Angiography;                Surgeon: Dionisio David, MD;  Location: [facility];  Service: Cardiovascular;  Laterality: Left; No date: CATARACT EXTRACTION W/ INTRAOCULAR LENS IMPLANT; Right 03/10/2017: CATARACT EXTRACTION W/PHACO; Left     Comment:  Procedure: CATARACT EXTRACTION PHACO AND INTRAOCULAR               LENS PLACEMENT (IOC);  Surgeon: Birder Robson, MD;                Location: [facility];  Service: Ophthalmology;  Laterality:              Left;  [place] 00:51.9 AP% 14.2 CDE 7.39 fluid pack lot #               [id number] 11/11/2019: CENTRAL LINE INSERTION; Right     Comment:  Procedure: CENTRAL LINE INSERTION;  Surgeon: Katha Cabal, MD;  Location: [facility]  [facility];  Service: Vascular;                Laterality: Right; 11/25/2019: CENTRAL LINE INSERTION     Comment:  Procedure: CENTRAL LINE INSERTION;  Surgeon: Delana Meyer,  Dolores Lory, MD;  Location: [facility];  Service: Vascular;; No date: CHOLECYSTECTOMY 08/12/2016: COLONOSCOPY WITH PROPOFOL; N/A     Comment:  Procedure: COLONOSCOPY WITH PROPOFOL;  Surgeon: Lollie Sails, MD;  Location[facility];  Service:               Endoscopy;  Laterality: N/A; No date: DIALYSIS FISTULA CREATION; Left     Comment:  upper arm No date: dialysis grafts 11/14/2019: DIALYSIS/PERMA CATHETER INSERTION; N/A     Comment:  Procedure: DIALYSIS/PERMA CATHETER INSERTION;  Surgeon:               Algernon Huxley, MD;  Location: [place];                Service: Cardiovascular;  Laterality: N/A; 02/03/2020: DIALYSIS/PERMA CATHETER INSERTION; N/A     Comment:  Procedure: DIALYSIS/PERMA CATHETER INSERTION;  Surgeon:               Katha Cabal, MD;  Location: [facility] [facility];               Service: Cardiovascular;  Laterality: N/A; 05/25/2020: DIALYSIS/PERMA CATHETER REMOVAL; N/A     Comment:  Procedure: DIALYSIS/PERMA CATHETER REMOVAL;  Surgeon:               Katha Cabal, MD;  Location: [place] [facility];               Service: Cardiovascular;  Laterality: N/A; 03/08/2015: ESOPHAGOGASTRODUODENOSCOPY; N/A     Comment:  Procedure: ESOPHAGOGASTRODUODENOSCOPY (EGD);  Surgeon:               Manya Silvas, MD;  Location: [facility] ENDOSCOPY;                Service: Endoscopy;  Laterality: N/A; 03/18/2016: ESOPHAGOGASTRODUODENOSCOPY (EGD) WITH PROPOFOL; N/A     Comment:  Procedure: ESOPHAGOGASTRODUODENOSCOPY (EGD) WITH               PROPOFOL;  Surgeon: [name], MD;  Location: [facility]               ENDOSCOPY;  Service: Endoscopy;  Laterality: N/A; 2018: EYE SURGERY; Right 08/23/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; N/A     Comment:  Procedure: FECAL TRANSPLANT;  Surgeon: Manya Silvas,              MD;  Location: [facility] ENDOSCOPY;  Service: Endoscopy;                Laterality: N/A; No date: HAND SURGERY; Bilateral 11/25/2019: HEMATOMA EVACUATION; Left     Comment:  Procedure: EVACUATION HEMATOMA;  Surgeon: Katha Cabal, MD;  Location: [facility];  Service: Vascular;                Laterality: Left; 11/25/2019: I & D EXTREMITY; Left     Comment:  Procedure: IRRIGATION AND DEBRIDEMENT EXTREMITY;                Surgeon: Katha Cabal, MD;  Location: [facility];                Service: Vascular;  Laterality: Left; 04/06/2020: IR FLUORO GUIDE CV LINE RIGHT 07/28/2019: IR RADIOLOGIST EVAL & MGMT 08/11/2019: IR RADIOLOGIST EVAL & MGMT 11/11/2019: LIGATION OF ARTERIOVENOUS  FISTULA; Left 11/11/2019: LIGATION OF ARTERIOVENOUS  FISTULA; Left     Comment:  Procedure: LIGATION OF ARTERIOVENOUS  FISTULA;  Surgeon:              Katha Cabal, MD;  Location: [facility];  Service:               Vascular;  Laterality: Left; 12/20/2015: PERIPHERAL VASCULAR CATHETERIZATION; N/A     Comment:  Procedure: Thrombectomy of dialysis access versus               permcath placement;  Surgeon: Algernon Huxley, MD;  Location:              [place] [facility];  Service: Cardiovascular;                Laterality: N/A; 12/20/2015: PERIPHERAL VASCULAR CATHETERIZATION; N/A     Comment:  Procedure: A/V Shunt Intervention;  Surgeon: Algernon Huxley, MD;  Location: [facility];  Service:               Cardiovascular;  Laterality: N/A; 12/20/2015: PERIPHERAL VASCULAR CATHETERIZATION; N/A     Comment:  Procedure: A/V Shuntogram/Fistulagram;  Surgeon: Algernon Huxley, MD;  Location: [place] [facility];  Service:               Cardiovascular;  Laterality: N/A; 01/02/2016: PERIPHERAL VASCULAR CATHETERIZATION; N/A     Comment:  Procedure: A/V Shuntogram/Fistulagram;  Surgeon: Algernon Huxley, MD;  Location: [place] [facility];  Service:                Cardiovascular;  Laterality: N/A; 01/02/2016: PERIPHERAL VASCULAR CATHETERIZATION; N/A     Comment:  Procedure: A/V Shunt Intervention;  Surgeon: Algernon Huxley, MD;  Location: [place] [facility];  Service:               Cardiovascular;  Laterality: N/A; 01/18/2020: UPPER EXTREMITY VENOGRAPHY; Right     Comment:  Procedure: UPPER EXTREMITY VENOGRAPHY;  Surgeon:               Katha Cabal, MD;  Location: [place] [facility];               Service: Cardiovascular;  Laterality: Right; 04/13/2020: VASCULAR ACCESS DEVICE INSERTION; Right     Comment:  Procedure: INSERTION OF HERO VASCULAR ACCESS DEVICE               (GRAFT);  Surgeon: Katha Cabal, MD;  Location:               [facility];  Service: Vascular;  Laterality: Right;  BMI    Body Mass Index: 31.70 kg/m      Reproductive/Obstetrics negative OB ROS                             Anesthesia Physical  Anesthesia Plan  ASA: III  Anesthesia Plan: General LMA   Post-op Pain Management:    Induction: Inhalational  PONV Risk Score and Plan:   Airway Management Planned: Oral ETT  Additional Equipment:   Intra-op Plan:   Post-operative Plan: Post-operative intubation/ventilation  Informed Consent: I  have reviewed the patients History and Physical, chart, labs and discussed the procedure including the risks, benefits and alternatives for the proposed anesthesia with the patient or authorized representative who has indicated his/her understanding and acceptance.       Plan Discussed with: Anesthesiologist, CRNA and Surgeon  Anesthesia Plan Comments: (Informed consent obtained at bedside from pt's son.  Discussed increased risk of adverse events given critical illness.  KR)        Anesthesia Quick Evaluation

## 2020-06-22 NOTE — Progress Notes (Signed)
Pharmacy Antibiotic Note [name] is a 59 y.o. female admitted on 06/02/2020 with respiratory failure secondary to acute diastolic CHF and COPD exacerbation. PMH includes ESRD on HD, HTN, DM, CAD, PCD.  Pharmacy has been consulted for vancomycin dosing.  Patient with ESRD on HD with numerous graft complications. Erythemous firm swelling in right cubital area of graft arm.  Fistula site noted as possible source of bloodstream infection, removed 9/22. Perm cath placed 9/32 without complications. Pharmacy is consulted for vancomycin dosing (from ampicillin).    TTE 9/16: moderate tricuspid valve regurgitation, no vegetation visualized  TEE 9/20: mild to moderate tricuspid valve regurgitation, no vegetation visualized. Procedure complicated by hypotension, respiratory depression, and bradycardia.    Patient receiving MWF dialysis. Most recent dialysis session 9/27 2 hrs with BFR 150, 9/25 session 3 hrs with BFR 250, 400 ordered.   Cultures this admission:  BCx 9/13: Enterococcus faecalis, amp sensitive, gent synergy sensitive BCx 9/14: Enterococcus faecalis  BCID 9/14: Enterococcus faecalis detected  BCx 9/16: NGTD in 4/4 bottles  Plan:   Initiate vancomycin 1500mg  loading dose followed by maintenance dose of 750 mg following dialysis. If patient starts to tolerate full sessions (BFR>250), administer maintenance dose of 1000 mg post-HD.   Monitor vancomycin level prior to HD before 3rd dose to determine if dose is needed and if dose adjustments are warranted   Monitor s/sx of infection resolution (I.e temp, WBC) throughout therapy  Height: 5\' 5"  (165.1 cm) Weight: 82.8 kg (182 lb 8.7 oz) IBW/kg (Calculated) : 57  Temp (24hrs), Avg:98.2 F (36.8 C), Min:97.9 F (36.6 C), Max:98.3 F (36.8 C)  Recent Labs  Lab 06/17/20 0620 06/17/20 0628 06/18/20 1726 06/19/20 0707 06/20/20 0634 06/21/20 0644 06/22/20 0547  WBC  --  7.3 6.2  --  4.4 3.7* 3.9*  CREATININE   < >  --  4.77*   4.83* 3.92* 4.99* 3.49* 4.44*   < > = values in this interval not displayed.    Estimated Creatinine Clearance: 14.1 mL/min (A) (by C-G formula based on SCr of 4.44 mg/dL (H)).    Allergies  Allergen Reactions  . Ace Inhibitors Swelling and Anaphylaxis  . Ativan [Lorazepam] Other (See Comments)    Reaction:Hallucinations and headaches  . Compazine [Prochlorperazine Edisylate] Anaphylaxis, Nausea And Vomiting and Other (See Comments)    Other reaction(s): dystonia from this vs. Reglan, 23 Jul - patient relates that she takes promethazine frequently with no problems  . Sumatriptan Succinate Other (See Comments)    Other reaction(s): delirium and hallucinations per [facility] records  . Zofran [Ondansetron] Nausea And Vomiting    Per pt. she is allergic to zofran or will experience adverse reaction like hallucination   . Losartan Nausea Only  . Prochlorperazine Other (See Comments)    Reaction:  Unknown . Patient does not remember reaction but she does have vertigo and anxiety along with n and v at times. Could be used to treat any of these   . Reglan [Metoclopramide] Other (See Comments)    Per patient her Dr. Evelina Bucy her off it   . Scopolamine Other (See Comments)    Dizziness, also has vertigo already  . Tape Rash    Plastic tape causes rash  . Tapentadol Rash    Antimicrobials this admission: azithromycin 9/12>>9/15 ceftriaxone 9/12>>9/15 Zosyn 9/15 >>9/16, 9/16 >> 9/17 gentamicin 9/17 >>9/24 ampicillin 9/17 >> Microbiology results: BCx 9/13: Enterococcus faecalis, amp sensitive, gent synergy sensitive BCx 9/14: Enterococcus faecalis  BCx 9/16: NGTD in 4/4  bottles  Thank you for allowing pharmacy to be a part of this patient's care.  Doreene Eland, PharmD, BCPS.   Work Cell: [phone] 06/22/2020 10:16 AM

## 2020-06-22 NOTE — Progress Notes (Signed)
Occupational Therapy Treatment Patient Details Name: [name] MRN: [id number] DOB: 09/06/1961 Today's Date: 06/22/2020    History of present illness 59 y.o. female admitted on 06/02/2020 with respiratory failure secondary to acute diastolic CHF and COPD exacerbation. PMH includes ESRD on HD, HTN, DM, CAD, PCD.  Patient with ESRD on HD with numerous graft complications. Permcath placed and temporary femoral cathetar removed   OT comments  Pt supine in bed and agreeable to OT intervention. Pt verbalized, " I need to try and poop on the toilet". OT placing BSC next to hospital bed with pt performing supine >sit with min A to EOB. Pt standing with min A from elevated bed height and static standing for ~2 minutes before performing stand pivot transfer onto [facility] with min HHA. Pt really did not want therapist to touch her during transfer secondary to pain. Pt able to have BM and wipes self while standing with min A for balance before returning back to bed. Pt needing mod A for B LEs back into bed secondary to fatigue. Pt required increased time for all mobility and min cuing to attend to tasks. Pt continues to benefit from acute OT intervention.    Follow Up Recommendations  SNF    Equipment Recommendations  Other (comment) (defer to next venue of care)       Precautions / Restrictions Precautions Precautions: Fall Restrictions Other Position/Activity Restrictions: pt with perm cath in place now       Mobility Bed Mobility Overal bed mobility: Needs Assistance Bed Mobility: Rolling;Supine to Sit Rolling: Min guard   Supine to sit: Mod assist     General bed mobility comments: mod A to get B LEs back into bed  Transfers Overall transfer level: Needs assistance Equipment used: 1 person hand held assist Transfers: Sit to/from Stand Sit to Stand: Min assist         General transfer comment: pt with increased time and min cuing for hand placement and technique    Balance Overall  balance assessment: Needs assistance Sitting-balance support: Bilateral upper extremity supported;Feet unsupported Sitting balance-Leahy Scale: Good     Standing balance support: Bilateral upper extremity supported Standing balance-Leahy Scale: Fair Standing balance comment: min A without use of AD          ADL either performed or assessed with clinical judgement   ADL Overall ADL's : Needs assistance/impaired        Toilet Transfer: Minimal assistance;RW   Toileting- Clothing Manipulation and Hygiene: Minimal assistance;Sit to/from stand        Vision Patient Visual Report: No change from baseline            Cognition Arousal/Alertness: Awake/alert Behavior During Therapy: WFL for tasks assessed/performed Overall Cognitive Status: Within Functional Limits for tasks assessed                            Pertinent Vitals/ Pain       Pain Assessment: Faces Faces Pain Scale: Hurts even more Pain Location: R LE Pain Descriptors / Indicators: Discomfort;Aching Pain Intervention(s): Limited activity within patient's tolerance;Monitored during session;Premedicated before session;Repositioned         Frequency  Min 1X/week        Progress Toward Goals  OT Goals(current goals can now be found in the care plan section)  Progress towards OT goals: Progressing toward goals  Acute Rehab OT Goals Patient Stated Goal: to get stonger and go home OT  Goal Formulation: With patient Time For Goal Achievement: 07/02/20 Potential to Achieve Goals: Good  Plan Discharge plan remains appropriate       AM-PAC OT "6 Clicks" Daily Activity     Outcome Measure   Help from another person eating meals?: None Help from another person taking care of personal grooming?: A Little Help from another person toileting, which includes using toliet, bedpan, or urinal?: A Little Help from another person bathing (including washing, rinsing, drying)?: A Little Help from another  person to put on and taking off regular upper body clothing?: A Little Help from another person to put on and taking off regular lower body clothing?: A Lot 6 Click Score: 18    End of Session    OT Visit Diagnosis: Muscle weakness (generalized) (M62.81)   Activity Tolerance Patient tolerated treatment well   Patient Left in bed;with call bell/phone within reach;with bed alarm set   Nurse Communication Mobility status        Time: 8119-1478 OT Time Calculation (min): 40 min  Charges: OT General Charges $OT Visit: 1 Visit OT Treatments $Self Care/Home Management : 38-52 mins  Darleen Crocker, MS, OTR/L , CBIS ascom [phone]  06/22/20, 2:59 PM

## 2020-06-22 NOTE — NC FL2 (Signed)
[place] LEVEL OF CARE SCREENING TOOL     IDENTIFICATION  Patient Name: [name] Birthdate: 10/14/1960 Sex: female Admission Date (Current Location): 06/02/2020  [place] and [place] Number:  Engineering geologist and Address:  [facility], [address] [place], [place] [address]      Provider Number: [id number]  Attending Physician Name and Address:  Enzo Bi, MD  Relative Name and Phone Number:       Current Level of Care: Hospital Recommended Level of Care: [place] Prior Approval Number:    Date Approved/Denied:   PASRR Number: [id number]  Discharge Plan: SNF    Current Diagnoses: Patient Active Problem List   Diagnosis Date Noted  . Obesity (BMI 30-39.9) 06/06/2020  . Anemia 05/31/2020  . Type 1 diabetes mellitus ([place]) 05/31/2020  . End-stage renal disease ([place]) 05/31/2020  . Hypertensive disorder 05/31/2020  . Arm pain 05/21/2020  . Cellulitis of right leg 05/02/2020  . ESRD (end stage renal disease) ([place]) 04/13/2020  . SOBOE (shortness of breath on exertion) 02/08/2020  . Hematoma of arm, left, subsequent encounter 11/24/2019  . Respiratory failure ([place])   . Shortness of breath   . Complication of arteriovenous dialysis fistula 11/13/2019  . PVD (peripheral vascular disease) ([place]) 11/13/2019  . Severe anemia 11/13/2019  . Pressure injury of skin 11/13/2019  . ESRD on hemodialysis ([place])   . Hemorrhagic shock ([place]) 11/11/2019  . Leg pain 10/02/2019  . CAP (community acquired pneumonia) 10/01/2019  . Atherosclerosis of native arteries of extremity with intermittent claudication ([place]) 05/01/2019  . Right-sided headache   . COPD (chronic obstructive pulmonary disease) ([place]) 10/06/2018  . Syncope 10/04/2018  . Symptomatic anemia 06/18/2018  . Gastroparesis due to DM ([place]) 01/18/2018  . Complication of vascular access for dialysis 12/04/2017  . Osteomyelitis ([place]) 09/30/2017  . Carotid stenosis  06/18/2017  . Bilateral carotid artery stenosis 06/16/2017  . Benign essential HTN 05/19/2017  . CKD (chronic kidney disease) stage 5, GFR less than 15 ml/min (HCC) 05/19/2017  . Hyperlipidemia, mixed 05/19/2017  . Shortness of breath 05/04/2017  . Rash, skin 12/16/2016  . Cellulitis of lower extremity 07/29/2016  . Chronic venous insufficiency 07/29/2016  . Lymphedema 07/29/2016  . TIA (transient ischemic attack) 04/21/2016  . Altered mental status 04/08/2016  . Hyperammonemia ([place]) 04/08/2016  . Elevated troponin 04/08/2016  . Depression 04/08/2016  . Depression, major, recurrent, severe with psychosis ([place]) 04/08/2016  . Blood in stool   . Intractable cyclical vomiting with nausea   . Reflux esophagitis   . Gastritis   . Generalized abdominal pain   . Uncontrollable vomiting   . Major depressive disorder, recurrent episode, moderate ([place]) 03/15/2016  . Adjustment disorder with mixed anxiety and depressed mood 03/15/2016  . Malnutrition of moderate degree 12/01/2015  . Renal mass   . Dyspnea   . Acute renal failure ([place])   . Respiratory failure ([place])   . High temperature 11/14/2015  . Encounter for central line placement   . Encounter for orogastric (OG) tube placement   . Nausea 11/12/2015  . Hyperkalemia 10/03/2015  . Diarrhea, unspecified 07/22/2015  . Pneumonia 05/21/2015  . Hypoglycemia 04/24/2015  . Unresponsiveness 04/24/2015  . Bradycardia 04/24/2015  . Hypothermia 04/24/2015  . Acute respiratory failure ([place]) 04/24/2015  . Acute diastolic CHF (congestive heart failure) ([place]) 04/05/2015  . Diabetic gastroparesis ([place]) 04/05/2015  . Hypokalemia 04/05/2015  . Generalized weakness 04/05/2015  . Acute pulmonary edema ([place]) 04/03/2015  .  Nausea and vomiting 04/03/2015  . Hypoglycemia associated with diabetes ([place]) 04/03/2015  . Anemia of chronic disease 04/03/2015  . Secondary hyperparathyroidism ([place]) 04/03/2015  . Pressure ulcer 04/02/2015  . Acute  respiratory failure with hypoxia ([place]) 04/01/2015  . Adjustment disorder with anxiety 03/14/2015  . Somatic symptom disorder, mild 03/08/2015  . Coronary artery disease involving native coronary artery of native heart without angina pectoris   . Nausea & vomiting 03/06/2015  . Abdominal pain 03/06/2015  . DM (diabetes mellitus) ([place]) 03/06/2015  . HTN (hypertension) 03/06/2015  . Gastroparesis 02/24/2015  . Pleural effusion 02/19/2015  . Bacteremia due to Enterococcus 02/19/2015  . End-stage renal disease on hemodialysis ([place]) 02/19/2015  . Diarrhea 08/01/2014    Orientation RESPIRATION BLADDER Height & Weight     Self, Time, Situation, Place  Normal Continent Weight: 182 lb 8.7 oz (82.8 kg) Height:  5' 5" (165.1 cm)  BEHAVIORAL SYMPTOMS/MOOD NEUROLOGICAL BOWEL NUTRITION STATUS      Continent Diet (see dc summary)  AMBULATORY STATUS COMMUNICATION OF NEEDS Skin   Extensive Assist Verbally Surgical wounds                       Personal Care Assistance Level of Assistance  Bathing, Feeding, Dressing Bathing Assistance: Limited assistance Feeding assistance: Independent Dressing Assistance: Limited assistance     Functional Limitations Info  Sight, Hearing, Speech Sight Info: Adequate Hearing Info: Adequate Speech Info: Adequate    SPECIAL CARE FACTORS FREQUENCY  PT (By licensed PT), OT (By licensed OT)     PT Frequency: 5x week OT Frequency: 3x week            Contractures Contractures Info: Not present    Additional Factors Info  Code Status, Allergies Code Status Info: Full Allergies Info: Ace Inhibitors, Ativan, Compazine, Sumatriptan Succinate, Zofran, Losartan, Prochlorperazine, Reglan, Scopolamine, Tape, Tapentadol           Current Medications (06/22/2020):  This is the current hospital active medication list Current Facility-Administered Medications  Medication Dose Route Frequency Provider Last Rate Last Admin  . 0.9 %  sodium chloride  infusion   Intravenous PRN Tyler Pita, MD 10 mL/hr at 06/21/20 1151 200 mL at 06/21/20 1151  . acetaminophen (TYLENOL) tablet 650 mg  650 mg Oral Q6H PRN Schnier, Dolores Lory, MD   650 mg at 06/06/20 6045   Or  . acetaminophen (TYLENOL) suppository 650 mg  650 mg Rectal Q6H PRN Schnier, Dolores Lory, MD      . aspirin EC tablet 81 mg  81 mg Oral Daily Katha Cabal, MD   81 mg at 06/22/20 0807  . atorvastatin (LIPITOR) tablet 20 mg  20 mg Oral Daily Schnier, Dolores Lory, MD   20 mg at 06/21/20 2133  . calcium carbonate (TUMS - dosed in mg elemental calcium) chewable tablet 500 mg of elemental calcium  500 mg of elemental calcium Oral Q6H PRN Schnier, Dolores Lory, MD      . camphor-menthol [facility]) lotion 1 application  1 application Topical W0J PRN Schnier, Dolores Lory, MD       And  . hydrOXYzine (ATARAX/VISTARIL) tablet 25 mg  25 mg Oral Q8H PRN Schnier, Dolores Lory, MD   25 mg at 06/20/20 0317  . carvedilol (COREG) tablet 6.25 mg  6.25 mg Oral BID WC Nicole Kindred A, DO   6.25 mg at 06/21/20 1636  . chlorhexidine gluconate (MEDLINE KIT) (PERIDEX) 0.12 % solution 15 mL  15 mL  Mouth Rinse BID Delana Meyer Dolores Lory, MD   15 mL at 06/22/20 0811  . Chlorhexidine Gluconate Cloth 2 % PADS 6 each  6 each Topical Daily Schnier, Dolores Lory, MD   6 each at 06/22/20 [phone]  . cholestyramine light (PREVALITE) packet 4 g  4 g Oral BID Schnier, Dolores Lory, MD   4 g at 06/22/20 0807  . dextrose 50 % solution 50 mL  1 ampule Intravenous Once Schnier, Dolores Lory, MD      . epoetin alfa (EPOGEN) injection 10,000 Units  10,000 Units Intravenous Q M,W,F-HD Murlean Iba, MD   10,000 Units at 06/20/20 1425  . feeding supplement (ENSURE ENLIVE) (ENSURE ENLIVE) liquid 237 mL  237 mL Oral Q24H Enzo Bi, MD      . ferrous sulfate tablet 325 mg  325 mg Oral Daily Schnier, Dolores Lory, MD   325 mg at 06/22/20 0807  . gabapentin (NEURONTIN) capsule 100 mg  100 mg Oral QPM Schnier, Dolores Lory, MD   100 mg at 06/21/20 1636  .  haloperidol lactate (HALDOL) injection 0.5 mg  0.5 mg Intravenous Q6H PRN Tyler Pita, MD      . heparin injection 5,000 Units  5,000 Units Subcutaneous Q8H Schnier, Dolores Lory, MD   5,000 Units at 06/22/20 1331  . hydrALAZINE (APRESOLINE) injection 10 mg  10 mg Intravenous Q4H PRN Schnier, Dolores Lory, MD   10 mg at 06/19/20 1702  . hydrALAZINE (APRESOLINE) tablet 25 mg  25 mg Oral Q8H Griffith, Kelly A, DO   25 mg at 06/21/20 2132  . hydrOXYzine (ATARAX/VISTARIL) tablet 25 mg  25 mg Oral Daily Schnier, Dolores Lory, MD   25 mg at 06/22/20 0807  . ipratropium-albuterol (DUONEB) 0.5-2.5 (3) MG/3ML nebulizer solution 3 mL  3 mL Nebulization Q4H PRN Schnier, Dolores Lory, MD   3 mL at 06/06/20 1715  . lipase/protease/amylase (CREON) capsule 12,000 Units  12,000 Units Oral TID WC Schnier, Dolores Lory, MD   12,000 Units at 06/22/20 1329  . loperamide (IMODIUM) capsule 2 mg  2 mg Oral PRN Schnier, Dolores Lory, MD      . multivitamin (RENA-VIT) tablet 1 tablet  1 tablet Oral Daily Schnier, Dolores Lory, MD   1 tablet at 06/22/20 0807  . naloxone [facility]) injection 0.4 mg  0.4 mg Intravenous PRN Delana Meyer Dolores Lory, MD   0.4 mg at 06/05/20 1159  . ondansetron (ZOFRAN) tablet 4 mg  4 mg Oral Q6H PRN Schnier, Dolores Lory, MD      . oxyCODONE-acetaminophen (PERCOCET/ROXICET) 5-325 MG per tablet 1 tablet  1 tablet Oral Q4H PRN Schnier, Dolores Lory, MD   1 tablet at 06/22/20 1329  . pantoprazole (PROTONIX) EC tablet 40 mg  40 mg Oral QHS Dallie Piles, [facility]      . promethazine (PHENERGAN) injection 12.5 mg  12.5 mg Intravenous Q6H PRN Schnier, Dolores Lory, MD   12.5 mg at 06/22/20 0915  . sorbitol 70 % solution 30 mL  30 mL Oral PRN Schnier, Dolores Lory, MD      . vancomycin (VANCOREADY) IVPB 1500 mg/300 mL  1,500 mg Intravenous Once Berton Mount, RPH      . [START ON 07/02/2020] vancomycin (VANCOREADY) IVPB 750 mg/150 mL  750 mg Intravenous Q M,W,F-HD Berton Mount, RPH      . vitamin B-12 (CYANOCOBALAMIN) tablet  1,000 mcg  1,000 mcg Oral Daily Schnier, Dolores Lory, MD   1,000 mcg at 06/22/20 0807  . zolpidem (AMBIEN)  tablet 5 mg  5 mg Oral QHS PRN Schnier, Dolores Lory, MD   5 mg at 06/17/20 2201     Discharge Medications: Please see discharge summary for a list of discharge medications.  Relevant Imaging Results:  Relevant Lab Results:   Additional Information SSN: [id number]  HD [facility] [place] MWF 2:45  Shade Flood, LCSW

## 2020-06-22 NOTE — Progress Notes (Signed)
PT Cancellation Note  Patient Details Name: EILLEEN DAVOLI MRN: [id number] DOB: Apr 23, 1961   Cancelled Treatment:    Reason Eval/Treat Not Completed: Patient at procedure or test/unavailable; Pt found to be out of room at HD per nursing.  Will attempt to see pt at a future date/time as medically appropriate.     Linus Salmons PT, DPT 06/22/20, 4:10 PM

## 2020-06-22 NOTE — Progress Notes (Signed)
[facility], [place] 06/22/20  Subjective:   LOS: 19  Patient seen during dialysis Tolerating well    HEMODIALYSIS FLOWSHEET:  Blood Flow Rate (mL/min): (P) 400 mL/min Arterial Pressure (mmHg): (P) -150 mmHg Venous Pressure (mmHg): (P) 150 mmHg Transmembrane Pressure (mmHg): (P) 70 mmHg Ultrafiltration Rate (mL/min): (P) 1190 mL/min Dialysate Flow Rate (mL/min): (P) 600 ml/min Conductivity: Machine : (P) 13.6 Conductivity: Machine : (P) 13.6 Dialysis Fluid Bolus: Normal Saline Bolus Amount (mL): 200 mL Dialysate Change: Other (comment) (3k2.5Ca)    Objective:  Vital signs in last 24 hours:  Temp:  [97.9 F (36.6 C)-98.6 F (37 C)] 98.6 F (37 C) (10/01 1510) Pulse Rate:  [57-68] 68 (10/01 1510) Resp:  [16] 16 (10/01 1510) BP: (156-192)/(60-82) 192/80 (10/01 1600) SpO2:  [98 %-100 %] 100 % (10/01 1510) Weight:  [82.8 kg] 82.8 kg (10/01 0500)  Weight change:  Filed Weights   06/15/20 0417 06/19/20 1422 06/22/20 0500  Weight: 81.4 kg 81.4 kg 82.8 kg    Intake/Output:    Intake/Output Summary (Last 24 hours) at 06/22/2020 1852 Last data filed at 06/22/2020 0700 Gross per 24 hour  Intake 0 ml  Output --  Net 0 ml     Physical Exam: General:   no acute distress  HEENT   Oral mucous membranes moist  Pulm/lungs  Lungs clear bilaterally  CVS/Heart  S1S2 no rub or gallop  Abdomen:   Soft,non distended  Extremities:  No peripheral edema  Neurologic:  Alert, awake, answers questions appropriately  Skin:  No acute rashes or lesions  Access:  Lt Chest Permcath       Basic Metabolic Panel:  Recent Labs  Lab 06/16/20 0545 06/16/20 0545 06/17/20 0620 06/17/20 1957 06/18/20 1726 06/18/20 1726 06/19/20 0707 06/19/20 0707 06/20/20 0634 06/21/20 0644 06/22/20 0547  NA 135   < > 136  --  134*  135  --  135  --  135 138 135  K 3.1*   < > 2.9*   < > 3.9  4.0  --  3.9  --  4.1 4.0 4.0  CL 96*   < > 98  --  96*  96*  --  97*  --   99 100 100  CO2 28   < > 27  --  26  27  --  25  --  _0 GLUCOSE 113*   < > 105*  --  92  95  --  63*  --  83 86 60*  BUN 21   < > 13  --  22  23  --  16  --  _1 CREATININE 4.84*   < > 3.40*  --  4.77*  4.83*  --  3.92*  --  4.99* 3.49* 4.44*  CALCIUM 7.6*   < > 8.0*  --  8.4*  8.5*   < > 8.4*   < > 8.1* 8.2* 8.4*  MG 1.7   < > 1.8  --  1.9  --  2.1  --  2.0 1.9 2.1  PHOS 4.3  --  2.8  --  3.5  --  3.1  --   --   --   --    < > = values in this interval not displayed.     CBC: Recent Labs  Lab 06/17/20 0628 06/18/20 1726 06/20/20 0634 06/21/20 0644 06/22/20 0547  WBC 7.3 6.2 4.4 3.7* 3.9*  HGB 7.4* 8.2* 8.2*  7.7* 7.6*  HCT 21.9* 25.3* 24.4* 22.3* 23.3*  MCV 86.2 88.2 85.9 85.4 88.6  PLT 190 216 183 154 172      Lab Results  Component Value Date   HEPBSAG NON REACTIVE 04/14/2020   HEPBIGM  06/12/2010    NEGATIVE (NOTE) High levels of Hepatitis B Core IgM antibody are detectable during the acute stage of Hepatitis B. This antibody is used to differentiate current from past HBV infection.       Microbiology:  Recent Results (from the past 240 hour(s))  Aerobic/Anaerobic Culture (surgical/deep wound)     Status: None   Collection Time: 06/13/20  2:23 PM   Specimen: Wound; Abscess  Result Value Ref Range Status   Specimen Description   Final    WOUND Performed at [facility], [address], [place], [place] [address]    Special Requests   Final    RIGHT ARM PERI GRAFT ABSCESS Performed at [facility], [place], [place] [address]    Gram Stain NO WBC SEEN NO ORGANISMS SEEN   Final   Culture   Final    RARE ENTEROCOCCUS FAECALIS NO ANAEROBES ISOLATED Performed at [facility], [place] [address] [place],  [address]    Report Status 06/18/2020 FINAL  Final   Organism ID, Bacteria ENTEROCOCCUS FAECALIS  Final      Susceptibility   Enterococcus faecalis - MIC*    AMPICILLIN <=2 SENSITIVE  Sensitive     VANCOMYCIN 1 SENSITIVE Sensitive     GENTAMICIN SYNERGY SENSITIVE Sensitive     * RARE ENTEROCOCCUS FAECALIS    Coagulation Studies: No results for input(s): LABPROT, INR in the last 72 hours.  Urinalysis: No results for input(s): COLORURINE, LABSPEC, PHURINE, GLUCOSEU, HGBUR, BILIRUBINUR, KETONESUR, PROTEINUR, UROBILINOGEN, NITRITE, LEUKOCYTESUR in the last 72 hours.  Invalid input(s): APPERANCEUR    Imaging: No results found.   Medications:   . sodium chloride 200 mL (06/21/20 1151)  . vancomycin 1,500 mg (06/22/20 1744)  . [START ON 07/02/2020] vancomycin     . aspirin EC  81 mg Oral Daily  . atorvastatin  20 mg Oral Daily  . carvedilol  6.25 mg Oral BID WC  . chlorhexidine gluconate (MEDLINE KIT)  15 mL Mouth Rinse BID  . Chlorhexidine Gluconate Cloth  6 each Topical Daily  . cholestyramine light  4 g Oral BID  . dextrose  1 ampule Intravenous Once  . epoetin (EPOGEN/PROCRIT) injection  10,000 Units Intravenous Q M,W,F-HD  . feeding supplement (ENSURE ENLIVE)  237 mL Oral Q24H  . ferrous sulfate  325 mg Oral Daily  . gabapentin  100 mg Oral QPM  . heparin  5,000 Units Subcutaneous Q8H  . hydrALAZINE  25 mg Oral Q8H  . hydrOXYzine  25 mg Oral Daily  . lipase/protease/amylase  12,000 Units Oral TID WC  . multivitamin  1 tablet Oral Daily  . pantoprazole  40 mg Oral QHS  . cyanocobalamin  1,000 mcg Oral Daily   sodium chloride, acetaminophen **OR** acetaminophen, calcium carbonate, camphor-menthol **AND** hydrOXYzine, haloperidol lactate, hydrALAZINE, ipratropium-albuterol, loperamide, naLOXone (NARCAN)  injection, ondansetron **OR** [DISCONTINUED] ondansetron (ZOFRAN) IV, oxyCODONE-acetaminophen, promethazine, sorbitol, zolpidem  Assessment/ Plan:  59 y.o. female was admitted on 06/02/2020 for  Principal Problem:   Bacteremia due to Enterococcus Active Problems:   Acute respiratory failure with hypoxia (HCC)   Anemia of chronic disease   Acute  diastolic CHF (congestive heart failure) (HCC)   COPD (chronic obstructive pulmonary  disease) ([place])   ESRD on hemodialysis ([place])   Benign essential HTN   Obesity (BMI 30-39.9)  ESRD (end stage renal disease) ([place]) [N18.6] CAP (community acquired pneumonia) [J18.9] ESRD on dialysis ([place]) [N18.6, Z99.2] Dyspnea, unspecified type [R06.00] Acute on chronic congestive heart failure, unspecified heart failure type ([place]) [I50.9]  #. ESRD Patient seen during dialysis Tolerating well d Patient has switched to [facility] as outpatient Will continue  MWF schedule Discharge is delayed as patient does not want to take the covid vaccine Educated about the risks and benefits of the vaccine in preventing serious illness and hospitalization. Patient's son was also on the phone with her She stated she will pray about it tonight and decide tomorrow  #. Anemia of CKD  Lab Results  Component Value Date   HGB 7.6 (L) 06/22/2020   Continue  Epogen 10,000 units with HD MWF Will continue monitoring CBCs  #. Secondary hyperparathyroidism of renal origin N 25.81      Component Value Date/Time   PTH 357 (H) 10/05/2018 1241   Lab Results  Component Value Date   PHOS 3.1 06/19/2020   Monitor calcium and phos level during this admission  #. Diabetes type 2 with CKD Hemoglobin A1C (%)  Date Value  07/19/2013 9.3 (H)   Hgb A1c MFr Bld (%)  Date Value  05/03/2020 5.7 (H)    #Enterococcal bacteremia TEE Negative for endocarditis Plan to start vancomycin post dialysis for 4 weeks per ID recommendation. 1500 mg till 07/11/20    LOS: [place] 10/1/20216:52 [place], [place]   \

## 2020-06-22 NOTE — Progress Notes (Signed)
Patient's blood sugar was 72. Given apple juice and encouragement to drink all of contents.  [name] 06/22/2020  6:57 AM

## 2020-06-22 NOTE — TOC Progression Note (Addendum)
Transition of Care [facility]) - Progression Note    Patient Details  Name: [name] MRN: [id number] Date of Birth: 12-02-1960  [facility]) CM/SW Contact  Shade Flood, LCSW Phone Number: 06/22/2020, 12:33 PM  Clinical Narrative:     Pt now agreeable to SNF per RN. Pt informed RN that she would like to go to [facility]. Spoke with pt who is agreeable to referral to [facility]. Will send out referral and start insurance auth. TOC will follow.  1653: Received update from [place] at [facility] that because pt is unvaccinated, she would have to be in quarantine and they cannot provide quarantine to pt who would be leaving three days a week for HD. Per Kenney Houseman, if pt would agree to be vaccinated, they might be able to take her sometime next week once the vaccine has had time to start providing some coverage.  Discussed above with pt who expresses hesitation to get the vaccine due to allergies and some additional concerns. Pt agreed to let [facility] refer to other SNF options to see if there is one that could offer care given her unvaccinated status along with need for Outpatient HD. If no other option is found, pt is agreeable to re-visit the idea of getting the vaccine.   Referred out to additional facilities. Updated pt's son at pt request.   Also, [facility] does not manage pt's [employer] Medicare so any facility she goes to will need to initiate auth.  Assigned TOC will follow.       Expected Discharge Plan and Services                                                 Social Determinants of Health (SDOH) Interventions    Readmission Risk Interventions Readmission Risk Prevention Plan 06/06/2020 05/03/2020  Transportation Screening Complete Complete  Medication Review Press photographer) Complete Complete  PCP or Specialist appointment within 3-5 days of discharge Complete Complete  HRI or Home Care Consult Complete -  SW Recovery Care/Counseling Consult -  Complete  Palliative Care Screening Not Applicable Not [place] Not Applicable Not Applicable  Some recent data might be hidden

## 2020-06-23 LAB — GLUCOSE, CAPILLARY
Glucose-Capillary: 67 mg/dL — ABNORMAL LOW (ref 70–99)
Glucose-Capillary: 86 mg/dL (ref 70–99)
Glucose-Capillary: 66 mg/dL — ABNORMAL LOW (ref 70–99)
Glucose-Capillary: 77 mg/dL (ref 70–99)

## 2020-06-23 LAB — CBC
HCT: 26.4 % — ABNORMAL LOW (ref 36.0–46.0)
Hemoglobin: 8.7 g/dL — ABNORMAL LOW (ref 12.0–15.0)
MCH: 28.9 pg (ref 26.0–34.0)
MCHC: 33 g/dL (ref 30.0–36.0)
MCV: 87.7 fL (ref 80.0–100.0)
Platelets: 224 10*3/uL (ref 150–400)
RBC: 3.01 MIL/uL — ABNORMAL LOW (ref 3.87–5.11)
RDW: 18.6 % — ABNORMAL HIGH (ref 11.5–15.5)
WBC: 5.4 10*3/uL (ref 4.0–10.5)
nRBC: 0 % (ref 0.0–0.2)

## 2020-06-23 LAB — MAGNESIUM: Magnesium: 1.9 mg/dL (ref 1.7–2.4)

## 2020-06-23 MED ORDER — SEVELAMER CARBONATE 800 MG PO TABS
800.0000 mg | ORAL_TABLET | Freq: Three times a day (TID) | ORAL | Status: DC
  Administered 2020-06-23 – 2020-06-25 (×5): 800 mg via ORAL
  Filled 2020-06-23 (×5): qty 1

## 2020-06-23 MED ORDER — PROMETHAZINE HCL 25 MG PO TABS
25.0000 mg | ORAL_TABLET | Freq: Four times a day (QID) | ORAL | Status: DC | PRN
  Administered 2020-06-23 – 2020-06-24 (×4): 25 mg via ORAL
  Filled 2020-06-23 (×5): qty 1

## 2020-06-23 NOTE — Progress Notes (Signed)
PROGRESS NOTE    [name]   WPV:[id number]  DOB: [birth year]  PCP: Center, Cyril    DOA: 06/02/2020 LOS: 30   Brief Narrative   59 year old female with past medical history of ESRD on dialysis (MWF), dCHF, anemia, CAD, renal cell cancer, pancreatitis, anemia, hypertension, diabetes mellitus, COPD, asthma, depression, anxiety, who was admitted on 06/02/2020 with fever/chills, shortness of breath.  Admitted with pneumonia and volume overloaded after missing dialysis.  BP uncontrolled on presentation with systolic BP in 748'O.  She underwent dialysis the evening of admission.   Patient apparently refused blood draw for initial blood cultures, these were eventually obtained on 9/13 and returned positive for Enterococcus faecalis which is thought secondary to AV graft infection with abscess.  ID was consulted.   A temporary R femoral dialysis catheter was placed on 9/18.  TEE done on 9/20 was negative for endocarditis.  During the procedure, patient became hypotensive (systolic 70'B) and bradycardic (HR 20's), apparently was a near cardiac arrest, and was in acute severe respiratory failure.  Intubated and transferred to ICU, required vasopressors.   9/22 Vascular surgery removed the infected RUE AV graft and performed endarterectomy of the R brachial artery with Cormatrix patch.  Required 2 units pRBC transfusion due to blood loss.  9/23 patient successfully extubated.  Remained confused, but encephalopathy improving.  9/25 severe abdominal pain, concern for retroperitoneal hemorrhage which was ruled out by CT abdomen/pelvis.  Family reported patient's pain from gastroparesis presents in similar fashion.  Patient transferred out of ICU later on 9/25 to Progressive and [facility] assumed care of patient on 9/26.    Assessment & Plan   Principal Problem:   Bacteremia due to Enterococcus Active Problems:   Acute respiratory failure with hypoxia (HCC)   Anemia of chronic  disease   Acute diastolic CHF (congestive heart failure) (HCC)   COPD (chronic obstructive pulmonary disease) (HCC)   ESRD on hemodialysis (HCC)   Benign essential HTN   Obesity (BMI 30-39.9)    Severe sepsis secondary to Enterococcus faecalis bacteremia  Source appears to have been infected AV graft which was removed 9/22. ID is following.   Was on Ampicillin + Gentamicin, currently on Ampicillin monotherapy. Repeat blood cultures are negative to date.  TEE neg for endocarditis. PLAN: --continue ampicillin --transition to IV vanc with dialysis after discharge to complete 4 weeks, end date 07/19/20  Acute Hypoxic  Respiratory Failure - Resolved - extubated 9/23, on room air  Hypotension off vasopressors   Acute on chronic diastolic CHF / Volume Overload - volume being managed with dialysis and has improved.  Now appears euvolemic.    Acute toxic/metabolic encephalopathy - appears was mostly due to sedating medications.  Mentation improving. --Delirium precautions:     -Lights and TV off, minimize interruptions at night    -Blinds open and lights on during day    -Glasses/hearing aid with patient    -Frequent reorientation    -PT/OT when able    -Avoid sedation medications/Beers list medications   Generalized weakness -  PT/OT rec SNF rehab.   Type 2 Diabetes with gastroparesis  Hypoglycemia - Hbg A1c 5.7 in 04/2020, well controlled.  Not on any insulin coverage. --BG checks to monitor for hypoglycemia.  ESRD on dialysis - M/W/F dialysis.  Nephrology following. --PermCath placed 9/28, temp femoral cath removed after  Acute blood loss anemia superimposed on anemia of chronic renal disease - pt required blood transfusions after AV graft revision, s/p 2  units pRBCs 9/23 and 9/24.  Hbg currently at/near baseline. --continue Epo with dialysis --Monitor Hgb and transfuse as needed  COPD - not acutely exacerbated.  Continue PRN albuterol.  Hypertension -  Continue  coreg --continue hydralazine 25 mg TID (new) --Hold home lasix   Obesity: Body mass index is 30.38 kg/m.  Complicates overall care and prognosis.     DVT prophylaxis: heparin injection 5,000 Units Start: 06/02/20 2300   Diet:  Diet Orders (From admission, onward)    Start     Ordered   06/19/20 2006  Diet regular Room service appropriate? Yes; Fluid consistency: Thin  Diet effective now       Question Answer Comment  Room service appropriate? Yes   Fluid consistency: Thin      06/19/20 2005            Code Status: Full Code    Subjective 06/23/20    Pt was seen in dialysis, not wanting to talk much.  Pt denied having any symptoms with her daily hypoglycemic events, and declined Ensure.    SNF requires that pt be vaccinated, otherwise needs to be isolated, which is not possible given dialysis needs.  Pt has so far refused vaccination.   Disposition Plan & Communication   DVT prophylaxis: Heparin SQ Code Status: Full code  Family Communication: son updated on the phone today Status is: inpatient Dispo:   The patient is from: home Anticipated d/c is to: SNF Anticipated d/c date is: whenever bed available Patient currently is medically stable to d/c.    Discharge barrier: SNF requires that pt be vaccinated, otherwise needs to be isolated, which is not possible given dialysis needs.  Pt has so far refused vaccination.  TOC currently looking for other facility options that may not have the vaccine and isolation requirements.    Consults, Procedures, Significant Events   Consultants:   Nephrology  PCCM  Vascular surgery  Cardiology  Procedures:   9/18 temporary femoral dialysis cath placed  9/20 TEE, intubated  9/22 removal of AV graft & R brachial endarterectomy  Dialysis  Antimicrobials:  Anti-infectives (From admission, onward)   Start     Dose/Rate Route Frequency Ordered Stop   07/02/20 1200  vancomycin (VANCOREADY) IVPB 750 mg/150 mL         750 mg 150 mL/hr over 60 Minutes Intravenous Every M-W-F (Hemodialysis) 06/22/20 1022     06/22/20 1800  vancomycin (VANCOREADY) IVPB 1500 mg/300 mL        1,500 mg 150 mL/hr over 120 Minutes Intravenous  Once 06/22/20 1022 06/22/20 1922   06/20/20 0000  ceFAZolin (ANCEF) IVPB 1 g/50 mL premix       Note to Pharmacy: To be given in specials   1 g 100 mL/hr over 30 Minutes Intravenous  Once 06/19/20 1430 06/19/20 1445   06/16/20 1200  gentamicin (GARAMYCIN) IVPB 80 mg  Status:  Discontinued        80 mg 100 mL/hr over 30 Minutes Intravenous Every T-Th-Sa (Hemodialysis) 06/15/20 0803 06/15/20 1350   06/15/20 1200  gentamicin (GARAMYCIN) IVPB 80 mg  Status:  Discontinued        80 mg 100 mL/hr over 30 Minutes Intravenous Every M-W-F (Hemodialysis) 06/13/20 1404 06/15/20 0803   06/14/20 1800  gentamicin (GARAMYCIN) 60 mg in dextrose 5 % 50 mL IVPB        60 mg 103 mL/hr over 30 Minutes Intravenous  Once 06/14/20 1643 06/14/20 1819   06/13/20 1800  gentamicin (GARAMYCIN) IVPB 80 mg  Status:  Discontinued        80 mg 100 mL/hr over 30 Minutes Intravenous Every M-W-F (Hemodialysis) 06/12/20 1402 06/13/20 1404   06/13/20 0600  vancomycin (VANCOCIN) IVPB 1000 mg/200 mL premix        1,000 mg 200 mL/hr over 60 Minutes Intravenous On call to O.R. 06/13/20 0114 06/13/20 1350   06/12/20 1600  gentamicin (GARAMYCIN) IVPB 60 mg  Status:  Discontinued        60 mg 100 mL/hr over 30 Minutes Intravenous  Once 06/12/20 1402 06/12/20 1412   06/12/20 1600  gentamicin (GARAMYCIN) 60 mg in dextrose 5 % 50 mL IVPB        60 mg 103 mL/hr over 30 Minutes Intravenous  Once 06/12/20 1412 06/12/20 2105   06/11/20 1200  gentamicin (GARAMYCIN) IVPB 80 mg  Status:  Discontinued        80 mg 100 mL/hr over 30 Minutes Intravenous Every M-W-F (Hemodialysis) 06/08/20 1356 06/12/20 1402   06/10/20 1900  gentamicin (GARAMYCIN) IVPB 80 mg        80 mg 100 mL/hr over 30 Minutes Intravenous  Once 06/10/20 1722  06/11/20 0056   06/08/20 1800  gentamicin (GARAMYCIN) IVPB 100 mg  Status:  Discontinued        100 mg 200 mL/hr over 30 Minutes Intravenous  Once 06/08/20 1356 06/08/20 1425   06/08/20 1800  gentamicin (GARAMYCIN) IVPB 100 mg  Status:  Discontinued        100 mg 200 mL/hr over 30 Minutes Intravenous  Once 06/08/20 1425 06/08/20 1430   06/08/20 1800  gentamicin (GARAMYCIN) IVPB 100 mg  Status:  Discontinued        100 mg 200 mL/hr over 30 Minutes Intravenous  Once 06/08/20 1430 06/08/20 1441   06/08/20 1800  gentamicin (GARAMYCIN) 100 mg in dextrose 5 % 50 mL IVPB        100 mg 105 mL/hr over 30 Minutes Intravenous  Once 06/08/20 1441 06/08/20 1927   06/08/20 1500  ampicillin (OMNIPEN) 2 g in sodium chloride 0.9 % 100 mL IVPB  Status:  Discontinued        2 g 300 mL/hr over 20 Minutes Intravenous Every 12 hours 06/08/20 1356 06/22/20 1012   06/07/20 2200  piperacillin-tazobactam (ZOSYN) IVPB 2.25 g  Status:  Discontinued        2.25 g 100 mL/hr over 30 Minutes Intravenous Every 8 hours 06/07/20 1657 06/08/20 1010   06/07/20 1400  ampicillin (OMNIPEN) 2 g in sodium chloride 0.9 % 100 mL IVPB  Status:  Discontinued        2 g 300 mL/hr over 20 Minutes Intravenous Every 8 hours 06/07/20 1025 06/07/20 1637   06/06/20 1600  piperacillin-tazobactam (ZOSYN) IVPB 2.25 g  Status:  Discontinued        2.25 g 100 mL/hr over 30 Minutes Intravenous Every 8 hours 06/06/20 1403 06/07/20 1025   06/03/20 1400  azithromycin (ZITHROMAX) 500 mg in sodium chloride 0.9 % 250 mL IVPB  Status:  Discontinued        500 mg 250 mL/hr over 60 Minutes Intravenous Every 24 hours 06/03/20 1322 06/06/20 1319   06/03/20 1400  cefTRIAXone (ROCEPHIN) 1 g in sodium chloride 0.9 % 100 mL IVPB  Status:  Discontinued        1 g 200 mL/hr over 30 Minutes Intravenous Every 24 hours 06/03/20 1322 06/06/20 1319       Significant Events:  From Dr. Patsey Berthold note on 9/25: "9/20 TEE performed Tee revealed no evidence of  endocartidits. Procedure complicated by hypotension and bradycaradia with heart rates dropping to mid 20's and blood pressure systolic to 25K. Received 1 amp of atropine, amp of epinephrine and neosynephrine. Pt intubated to protect air way. Stat head ct and chest ct were unremarkable. Pt son notified immediately and has been updated with care. 9/21 remains on vent, plan for SAT/SBT 9/22 plan for OR today  MRI BRAIN-IMPRESSION: 1. No acute intracranial abnormality. 2. Few small remote lacunar infarcts involving the pons. 3. Otherwise normal brain MRI for age. 9/23 HD performed patient extubated after successful SAT/SBT 9/24 uneventful day, still somewhat encephalopathic but clearing"  Objective   Vitals:   06/22/20 2048 06/23/20 0024 06/23/20 0458 06/23/20 1214  BP: (!) 159/60 (!) 157/60 (!) 169/85 (!) 186/81  Pulse: 77 82 (!) 57 (!) 58  Resp: _0 Temp: 97.7 F (36.5 C) 98 F (36.7 C) 97.8 F (36.6 C) 98.2 F (36.8 C)  TempSrc: Oral Oral Oral Oral  SpO2: 100% 100% 100% 100%  Weight:      Height:        Intake/Output Summary (Last 24 hours) at 06/23/2020 1649 Last data filed at 06/22/2020 1825 Gross per 24 hour  Intake --  Output 1948 ml  Net -1948 ml   Filed Weights   06/15/20 0417 06/19/20 1422 06/22/20 0500  Weight: 81.4 kg 81.4 kg 82.8 kg    Physical Exam:  Constitutional: NAD, AAOx3, in dialysis HEENT: conjunctivae and lids normal, EOMI CV: No cyanosis.   RESP: normal respiratory effort Extremities: No effusions, edema in BLE SKIN: warm, dry and intact Neuro: II - XII grossly intact.   Psych: annoyed mood and affect.     Labs   Data Reviewed: I have personally reviewed following labs and imaging studies  CBC: Recent Labs  Lab 06/18/20 1726 06/20/20 0634 06/21/20 0644 06/22/20 0547 06/23/20 1053  WBC 6.2 4.4 3.7* 3.9* 5.4  HGB 8.2* 8.2* 7.7* 7.6* 8.7*  HCT 25.3* 24.4* 22.3* 23.3* 26.4*  MCV 88.2 85.9 85.4 88.6 87.7  PLT 216 183 154 172 539    Basic Metabolic Panel: Recent Labs  Lab 06/17/20 0620 06/17/20 1957 06/18/20 1726 06/18/20 1726 06/19/20 0707 06/20/20 0634 06/21/20 0644 06/22/20 0547 06/23/20 1053  NA 136  --  134*  135  --  135 135 138 135  --   K 2.9*   < > 3.9  4.0  --  3.9 4.1 4.0 4.0  --   CL 98  --  96*  96*  --  97* 99 100 100  --   CO2 27  --  26  27  --  _1 --   GLUCOSE 105*  --  92  95  --  63* 83 86 60*  --   BUN 13  --  22  23  --  _2 --   CREATININE 3.40*  --  4.77*  4.83*  --  3.92* 4.99* 3.49* 4.44*  --   CALCIUM 8.0*  --  8.4*  8.5*  --  8.4* 8.1* 8.2* 8.4*  --   MG 1.8  --  1.9   < > 2.1 2.0 1.9 2.1 1.9  PHOS 2.8  --  3.5  --  3.1  --   --   --   --    < > = values in  this interval not displayed.   GFR: Estimated Creatinine Clearance: 14.1 mL/min (A) (by C-G formula based on SCr of 4.44 mg/dL (H)). Liver Function Tests: Recent Labs  Lab 06/17/20 0620 06/18/20 1726 06/19/20 0707  ALBUMIN 2.3* 2.3* 2.3*   No results for input(s): LIPASE, AMYLASE in the last 168 hours. No results for input(s): AMMONIA in the last 168 hours. Coagulation Profile: No results for input(s): INR, PROTIME in the last 168 hours. Cardiac Enzymes: No results for input(s): CKTOTAL, CKMB, CKMBINDEX, TROPONINI in the last 168 hours. BNP (last 3 results) No results for input(s): PROBNP in the last 8760 hours. HbA1C: No results for input(s): HGBA1C in the last 72 hours. CBG: Recent Labs  Lab 06/22/20 0804 06/22/20 1147 06/22/20 2040 06/23/20 0807 06/23/20 1212  GLUCAP 59* 92 83 67* 86   Lipid Profile: No results for input(s): CHOL, HDL, LDLCALC, TRIG, CHOLHDL, LDLDIRECT in the last 72 hours. Thyroid Function Tests: No results for input(s): TSH, T4TOTAL, FREET4, T3FREE, THYROIDAB in the last 72 hours. Anemia Panel: No results for input(s): VITAMINB12, FOLATE, FERRITIN, TIBC, IRON, RETICCTPCT in the last 72 hours. Sepsis Labs: No results for input(s): PROCALCITON,  LATICACIDVEN in the last 168 hours.  No results found for this or any previous visit (from the past 240 hour(s)).    Imaging Studies   No results found.   Medications   Scheduled Meds: . aspirin EC  81 mg Oral Daily  . atorvastatin  20 mg Oral Daily  . carvedilol  6.25 mg Oral BID WC  . chlorhexidine gluconate (MEDLINE KIT)  15 mL Mouth Rinse BID  . Chlorhexidine Gluconate Cloth  6 each Topical Daily  . cholestyramine light  4 g Oral BID  . dextrose  1 ampule Intravenous Once  . epoetin (EPOGEN/PROCRIT) injection  10,000 Units Intravenous Q M,W,F-HD  . feeding supplement (ENSURE ENLIVE)  237 mL Oral Q24H  . ferrous sulfate  325 mg Oral Daily  . gabapentin  100 mg Oral QPM  . heparin  5,000 Units Subcutaneous Q8H  . hydrALAZINE  25 mg Oral Q8H  . hydrOXYzine  25 mg Oral Daily  . lipase/protease/amylase  12,000 Units Oral TID WC  . multivitamin  1 tablet Oral Daily  . pantoprazole  40 mg Oral QHS  . cyanocobalamin  1,000 mcg Oral Daily   Continuous Infusions: . sodium chloride 200 mL (06/21/20 1151)  . [START ON 07/02/2020] vancomycin         LOS: 20 days     Enzo Bi, MD [facility]  06/23/2020, 4:49 PM

## 2020-06-23 NOTE — Progress Notes (Signed)
PIV consult: R arm restricted. L arm assessed with ultrasound. Suitable vein found in distal forearm medially. Pt declined IV placement. Concerned that BP and phlebotomy is done at that site and she wanted to speak with MD about it in the morning. No IV meds due at this time. RN made aware.

## 2020-06-23 NOTE — Progress Notes (Signed)
PROGRESS NOTE    [name]   OZD:[id number]  DOB: May 11, 1961  PCP: [facility] [place]    DOA: 06/02/2020 LOS: 41   Brief Narrative   59 year old female with past medical history of ESRD on dialysis (MWF), dCHF, anemia, CAD, renal cell cancer, pancreatitis, anemia, hypertension, diabetes mellitus, COPD, asthma, depression, anxiety, who was admitted on 06/02/2020 with fever/chills, shortness of breath.  Admitted with pneumonia and volume overloaded after missing dialysis.  BP uncontrolled on presentation with systolic BP in 259'D.  She underwent dialysis the evening of admission.   Patient apparently refused blood draw for initial blood cultures, these were eventually obtained on 9/13 and returned positive for Enterococcus faecalis which is thought secondary to AV graft infection with abscess.  ID was consulted.   A temporary R femoral dialysis catheter was placed on 9/18.  TEE done on 9/20 was negative for endocarditis.  During the procedure, patient became hypotensive (systolic 63'O) and bradycardic (HR 20's), apparently was a near cardiac arrest, and was in acute severe respiratory failure.  Intubated and transferred to ICU, required vasopressors.   9/22 Vascular surgery removed the infected RUE AV graft and performed endarterectomy of the R brachial artery with Cormatrix patch.  Required 2 units pRBC transfusion due to blood loss.  9/23 patient successfully extubated.  Remained confused, but encephalopathy improving.  9/25 severe abdominal pain, concern for retroperitoneal hemorrhage which was ruled out by CT abdomen/pelvis.  Family reported patient's pain from gastroparesis presents in similar fashion.  Patient transferred out of ICU later on 9/25 to Progressive and TRH assumed care of patient on 9/26.    Assessment & Plan   Principal Problem:   Bacteremia due to Enterococcus Active Problems:   Acute respiratory failure with hypoxia (HCC)   Anemia of chronic  disease   Acute diastolic CHF (congestive heart failure) (HCC)   COPD (chronic obstructive pulmonary disease) (HCC)   ESRD on hemodialysis (HCC)   Benign essential HTN   Obesity (BMI 30-39.9)    Severe sepsis secondary to Enterococcus faecalis bacteremia  Source appears to have been infected AV graft which was removed 9/22. ID is following.   Was on Ampicillin + Gentamicin, currently on Ampicillin monotherapy. Repeat blood cultures are negative to date.  TEE neg for endocarditis. PLAN: --transitioned to IV vanc, per ID --after discharge, will give IV vanc with dialysis to complete 4 weeks, end date 07/19/20  Acute Hypoxic  Respiratory Failure - Resolved - extubated 9/23, on room air  Hypotension off vasopressors   Acute on chronic diastolic CHF / Volume Overload - volume being managed with dialysis and has improved.  Now appears euvolemic.    Acute toxic/metabolic encephalopathy - appears was mostly due to sedating medications.  Mentation improving. --Delirium precautions:     -Lights and TV off, minimize interruptions at night    -Blinds open and lights on during day    -Glasses/hearing aid with patient    -Frequent reorientation    -PT/OT when able    -Avoid sedation medications/Beers list medications   Generalized weakness -  PT/OT rec SNF rehab.   Type 2 Diabetes with gastroparesis  Hypoglycemia - Hbg A1c 5.7 in 04/2020, well controlled.  Not on any insulin coverage. --BG checks to monitor for hypoglycemia.  ESRD on dialysis - M/W/F dialysis.  Nephrology following. --PermCath placed 9/28, temp femoral cath removed after  Acute blood loss anemia superimposed on anemia of chronic renal disease - pt required blood transfusions after AV  graft revision, s/p 2 units pRBCs 9/23 and 9/24.  Hbg currently at/near baseline. --continue Epo with dialysis --Monitor Hgb and transfuse as needed  COPD - not acutely exacerbated.  Continue PRN albuterol.  Hypertension -   Continue coreg --continue hydralazine 25 mg TID (new) --Hold home lasix   Obesity: Body mass index is 30.38 kg/m.  Complicates overall care and prognosis.     DVT prophylaxis: heparin injection 5,000 Units Start: 06/02/20 2300   Diet:  Diet Orders (From admission, onward)    Start     Ordered   06/19/20 2006  Diet regular Room service appropriate? Yes; Fluid consistency: Thin  Diet effective now       Question Answer Comment  Room service appropriate? Yes   Fluid consistency: Thin      06/19/20 2005            Code Status: Full Code    Subjective 06/23/20   Pt complained of nausea and vomiting, however, nursing noted more of a spitting up.  Pt also reported diarrhea.  Nursing noted 1 episode of loose stool.   Disposition Plan & Communication   DVT prophylaxis: Heparin SQ Code Status: Full code  Family Communication: son updated on the phone today Status is: inpatient Dispo:   The patient is from: home Anticipated d/c is to: SNF Anticipated d/c date is: whenever bed available Patient currently is medically stable to d/c.    Discharge barrier: SNF requires that pt be vaccinated, otherwise needs to be isolated, which is not possible given dialysis needs.  Pt has so far refused vaccination.  TOC currently looking for other facility options that may not have the vaccine and isolation requirements.    Consults, Procedures, Significant Events   Consultants:   Nephrology  PCCM  Vascular surgery  Cardiology  Procedures:   9/18 temporary femoral dialysis cath placed  9/20 TEE, intubated  9/22 removal of AV graft & R brachial endarterectomy  Dialysis  Antimicrobials:  Anti-infectives (From admission, onward)   Start     Dose/Rate Route Frequency Ordered Stop   07/02/20 1200  vancomycin (VANCOREADY) IVPB 750 mg/150 mL        750 mg 150 mL/hr over 60 Minutes Intravenous Every M-W-F (Hemodialysis) 06/22/20 1022     06/22/20 1800  vancomycin  (VANCOREADY) IVPB 1500 mg/300 mL        1,500 mg 150 mL/hr over 120 Minutes Intravenous  Once 06/22/20 1022 06/22/20 1922   06/20/20 0000  ceFAZolin (ANCEF) IVPB 1 g/50 mL premix       Note to Pharmacy: To be given in specials   1 g 100 mL/hr over 30 Minutes Intravenous  Once 06/19/20 1430 06/19/20 1445   06/16/20 1200  gentamicin (GARAMYCIN) IVPB 80 mg  Status:  Discontinued        80 mg 100 mL/hr over 30 Minutes Intravenous Every T-Th-Sa (Hemodialysis) 06/15/20 0803 06/15/20 1350   06/15/20 1200  gentamicin (GARAMYCIN) IVPB 80 mg  Status:  Discontinued        80 mg 100 mL/hr over 30 Minutes Intravenous Every M-W-F (Hemodialysis) 06/13/20 1404 06/15/20 0803   06/14/20 1800  gentamicin (GARAMYCIN) 60 mg in dextrose 5 % 50 mL IVPB        60 mg 103 mL/hr over 30 Minutes Intravenous  Once 06/14/20 1643 06/14/20 1819   06/13/20 1800  gentamicin (GARAMYCIN) IVPB 80 mg  Status:  Discontinued        80 mg 100 mL/hr over 30  Minutes Intravenous Every M-W-F (Hemodialysis) 06/12/20 1402 06/13/20 1404   06/13/20 0600  vancomycin (VANCOCIN) IVPB 1000 mg/200 mL premix        1,000 mg 200 mL/hr over 60 Minutes Intravenous On call to O.R. 06/13/20 0114 06/13/20 1350   06/12/20 1600  gentamicin (GARAMYCIN) IVPB 60 mg  Status:  Discontinued        60 mg 100 mL/hr over 30 Minutes Intravenous  Once 06/12/20 1402 06/12/20 1412   06/12/20 1600  gentamicin (GARAMYCIN) 60 mg in dextrose 5 % 50 mL IVPB        60 mg 103 mL/hr over 30 Minutes Intravenous  Once 06/12/20 1412 06/12/20 2105   06/11/20 1200  gentamicin (GARAMYCIN) IVPB 80 mg  Status:  Discontinued        80 mg 100 mL/hr over 30 Minutes Intravenous Every M-W-F (Hemodialysis) 06/08/20 1356 06/12/20 1402   06/10/20 1900  gentamicin (GARAMYCIN) IVPB 80 mg        80 mg 100 mL/hr over 30 Minutes Intravenous  Once 06/10/20 1722 06/11/20 0056   06/08/20 1800  gentamicin (GARAMYCIN) IVPB 100 mg  Status:  Discontinued        100 mg 200 mL/hr over 30  Minutes Intravenous  Once 06/08/20 1356 06/08/20 1425   06/08/20 1800  gentamicin (GARAMYCIN) IVPB 100 mg  Status:  Discontinued        100 mg 200 mL/hr over 30 Minutes Intravenous  Once 06/08/20 1425 06/08/20 1430   06/08/20 1800  gentamicin (GARAMYCIN) IVPB 100 mg  Status:  Discontinued        100 mg 200 mL/hr over 30 Minutes Intravenous  Once 06/08/20 1430 06/08/20 1441   06/08/20 1800  gentamicin (GARAMYCIN) 100 mg in dextrose 5 % 50 mL IVPB        100 mg 105 mL/hr over 30 Minutes Intravenous  Once 06/08/20 1441 06/08/20 1927   06/08/20 1500  ampicillin (OMNIPEN) 2 g in sodium chloride 0.9 % 100 mL IVPB  Status:  Discontinued        2 g 300 mL/hr over 20 Minutes Intravenous Every 12 hours 06/08/20 1356 06/22/20 1012   06/07/20 2200  piperacillin-tazobactam (ZOSYN) IVPB 2.25 g  Status:  Discontinued        2.25 g 100 mL/hr over 30 Minutes Intravenous Every 8 hours 06/07/20 1657 06/08/20 1010   06/07/20 1400  ampicillin (OMNIPEN) 2 g in sodium chloride 0.9 % 100 mL IVPB  Status:  Discontinued        2 g 300 mL/hr over 20 Minutes Intravenous Every 8 hours 06/07/20 1025 06/07/20 1637   06/06/20 1600  piperacillin-tazobactam (ZOSYN) IVPB 2.25 g  Status:  Discontinued        2.25 g 100 mL/hr over 30 Minutes Intravenous Every 8 hours 06/06/20 1403 06/07/20 1025   06/03/20 1400  azithromycin (ZITHROMAX) 500 mg in sodium chloride 0.9 % 250 mL IVPB  Status:  Discontinued        500 mg 250 mL/hr over 60 Minutes Intravenous Every 24 hours 06/03/20 1322 06/06/20 1319   06/03/20 1400  cefTRIAXone (ROCEPHIN) 1 g in sodium chloride 0.9 % 100 mL IVPB  Status:  Discontinued        1 g 200 mL/hr over 30 Minutes Intravenous Every 24 hours 06/03/20 1322 06/06/20 1319       Significant Events: From Dr. Patsey Berthold note on 9/25: "9/20 TEE performed Tee revealed no evidence of endocartidits. Procedure complicated by hypotension and bradycaradia with  heart rates dropping to mid 20's and blood pressure  systolic to 73A. Received 1 amp of atropine, amp of epinephrine and neosynephrine. Pt intubated to protect air way. Stat head ct and chest ct were unremarkable. Pt son notified immediately and has been updated with care. 9/21 remains on vent, plan for SAT/SBT 9/22 plan for OR today  MRI BRAIN-IMPRESSION: 1. No acute intracranial abnormality. 2. Few small remote lacunar infarcts involving the pons. 3. Otherwise normal brain MRI for age. 9/23 HD performed patient extubated after successful SAT/SBT 9/24 uneventful day, still somewhat encephalopathic but clearing"  Objective   Vitals:   06/23/20 0024 06/23/20 0458 06/23/20 1214 06/23/20 1656  BP: (!) 157/60 (!) 169/85 (!) 186/81 (!) 177/62  Pulse: 82 (!) 57 (!) 58 60  Resp: 16  16 16   Temp: 98 F (36.7 C) 97.8 F (36.6 C) 98.2 F (36.8 C) 98.1 F (36.7 C)  TempSrc: Oral Oral Oral Oral  SpO2: 100% 100% 100% 99%  Weight:      Height:        Intake/Output Summary (Last 24 hours) at 06/23/2020 1757 Last data filed at 06/22/2020 1825 Gross per 24 hour  Intake --  Output 1948 ml  Net -1948 ml   Filed Weights   06/15/20 0417 06/19/20 1422 06/22/20 0500  Weight: 81.4 kg 81.4 kg 82.8 kg    Physical Exam:  Constitutional: NAD, alert, oriented HEENT: conjunctivae and lids normal, EOMI CV: No cyanosis.   RESP: normal respiratory effort, on RA SKIN: warm, dry and intact Neuro: II - XII grossly intact.   Psych: annoyed mood and affect.    Labs   Data Reviewed: I have personally reviewed following labs and imaging studies  CBC: Recent Labs  Lab 06/18/20 1726 06/20/20 0634 06/21/20 0644 06/22/20 0547 06/23/20 1053  WBC 6.2 4.4 3.7* 3.9* 5.4  HGB 8.2* 8.2* 7.7* 7.6* 8.7*  HCT 25.3* 24.4* 22.3* 23.3* 26.4*  MCV 88.2 85.9 85.4 88.6 87.7  PLT 216 183 154 172 193   Basic Metabolic Panel: Recent Labs  Lab 06/17/20 0620 06/17/20 1957 06/18/20 1726 06/18/20 1726 06/19/20 0707 06/20/20 0634 06/21/20 0644 06/22/20 0547  06/23/20 1053  NA 136  --  134*  135  --  135 135 138 135  --   K 2.9*   < > 3.9  4.0  --  3.9 4.1 4.0 4.0  --   CL 98  --  96*  96*  --  97* 99 100 100  --   CO2 27  --  26  27  --  25 25 27 25   --   GLUCOSE 105*  --  92  95  --  63* 83 86 60*  --   BUN 13  --  22  23  --  16 22 12 15   --   CREATININE 3.40*  --  4.77*  4.83*  --  3.92* 4.99* 3.49* 4.44*  --   CALCIUM 8.0*  --  8.4*  8.5*  --  8.4* 8.1* 8.2* 8.4*  --   MG 1.8  --  1.9   < > 2.1 2.0 1.9 2.1 1.9  PHOS 2.8  --  3.5  --  3.1  --   --   --   --    < > = values in this interval not displayed.   GFR: Estimated Creatinine Clearance: 14.1 mL/min (A) (by C-G formula based on SCr of 4.44 mg/dL (H)). Liver Function Tests: Recent Labs  Lab 06/17/20 0620 06/18/20 1726 06/19/20 0707  ALBUMIN 2.3* 2.3* 2.3*   No results for input(s): LIPASE, AMYLASE in the last 168 hours. No results for input(s): AMMONIA in the last 168 hours. Coagulation Profile: No results for input(s): INR, PROTIME in the last 168 hours. Cardiac Enzymes: No results for input(s): CKTOTAL, CKMB, CKMBINDEX, TROPONINI in the last 168 hours. BNP (last 3 results) No results for input(s): PROBNP in the last 8760 hours. HbA1C: No results for input(s): HGBA1C in the last 72 hours. CBG: Recent Labs  Lab 06/22/20 1147 06/22/20 2040 06/23/20 0807 06/23/20 1212 06/23/20 1654  GLUCAP 92 83 67* 86 66*   Lipid Profile: No results for input(s): CHOL, HDL, LDLCALC, TRIG, CHOLHDL, LDLDIRECT in the last 72 hours. Thyroid Function Tests: No results for input(s): TSH, T4TOTAL, FREET4, T3FREE, THYROIDAB in the last 72 hours. Anemia Panel: No results for input(s): VITAMINB12, FOLATE, FERRITIN, TIBC, IRON, RETICCTPCT in the last 72 hours. Sepsis Labs: No results for input(s): PROCALCITON, LATICACIDVEN in the last 168 hours.  No results found for this or any previous visit (from the past 240 hour(s)).    Imaging Studies   No results found.   Medications    Scheduled Meds: . aspirin EC  81 mg Oral Daily  . atorvastatin  20 mg Oral Daily  . carvedilol  6.25 mg Oral BID WC  . chlorhexidine gluconate (MEDLINE KIT)  15 mL Mouth Rinse BID  . Chlorhexidine Gluconate Cloth  6 each Topical Daily  . cholestyramine light  4 g Oral BID  . dextrose  1 ampule Intravenous Once  . epoetin (EPOGEN/PROCRIT) injection  10,000 Units Intravenous Q M,W,F-HD  . feeding supplement (ENSURE ENLIVE)  237 mL Oral Q24H  . ferrous sulfate  325 mg Oral Daily  . gabapentin  100 mg Oral QPM  . heparin  5,000 Units Subcutaneous Q8H  . hydrALAZINE  25 mg Oral Q8H  . hydrOXYzine  25 mg Oral Daily  . lipase/protease/amylase  12,000 Units Oral TID WC  . multivitamin  1 tablet Oral Daily  . pantoprazole  40 mg Oral QHS  . sevelamer carbonate  800 mg Oral TID WC  . cyanocobalamin  1,000 mcg Oral Daily   Continuous Infusions: . sodium chloride 200 mL (06/21/20 1151)  . [START ON 07/02/2020] vancomycin         LOS: 20 days     Enzo Bi, MD [facility]  06/23/2020, 5:57 PM

## 2020-06-23 NOTE — Progress Notes (Signed)
[facility], [place] 06/23/20  Subjective:   LOS: 20  Pt had HD yesterday.  Having some nausea and vomiting today. Had some questions regarding the covid 19 infection.    Objective:  Vital signs in last 24 hours:  Temp:  [97.7 F (36.5 C)-98.6 F (37 C)] 98.2 F (36.8 C) (10/02 1214) Pulse Rate:  [57-94] 58 (10/02 1214) Resp:  [16] 16 (10/02 1214) BP: (143-192)/(60-85) 186/81 (10/02 1214) SpO2:  [100 %] 100 % (10/02 1214)  Weight change:  Filed Weights   06/15/20 0417 06/19/20 1422 06/22/20 0500  Weight: 81.4 kg 81.4 kg 82.8 kg    Intake/Output:    Intake/Output Summary (Last 24 hours) at 06/23/2020 1357 Last data filed at 06/22/2020 1825 Gross per 24 hour  Intake --  Output 1948 ml  Net -1948 ml     Physical Exam: General:  no acute distress  HEENT  Oral mucous membranes moist  Pulm/lungs  Lungs clear bilaterally  CVS/Heart  S1S2 no rub or gallop  Abdomen:   Soft,non distended  Extremities:  No peripheral edema  Neurologic:  Alert, awake, answers questions appropriately  Skin:  No acute rashes or lesions  Access:  Lt Chest Permcath       Basic Metabolic Panel:  Recent Labs  Lab 06/17/20 0620 06/17/20 1957 06/18/20 1726 06/18/20 1726 06/19/20 0707 06/19/20 0707 06/20/20 0634 06/21/20 0644 06/22/20 0547 06/23/20 1053  NA 136  --  134*  135  --  135  --  135 138 135  --   K 2.9*   < > 3.9  4.0  --  3.9  --  4.1 4.0 4.0  --   CL 98  --  96*  96*  --  97*  --  99 100 100  --   CO2 27  --  26  27  --  25  --  _0 --   GLUCOSE 105*  --  92  95  --  63*  --  83 86 60*  --   BUN 13  --  22  23  --  16  --  _1 --   CREATININE 3.40*  --  4.77*  4.83*  --  3.92*  --  4.99* 3.49* 4.44*  --   CALCIUM 8.0*  --  8.4*  8.5*   < > 8.4*   < > 8.1* 8.2* 8.4*  --   MG 1.8  --  1.9   < > 2.1  --  2.0 1.9 2.1 1.9  PHOS 2.8  --  3.5  --  3.1  --   --   --   --   --    < > = values in this interval not displayed.      CBC: Recent Labs  Lab 06/18/20 1726 06/20/20 0634 06/21/20 0644 06/22/20 0547 06/23/20 1053  WBC 6.2 4.4 3.7* 3.9* 5.4  HGB 8.2* 8.2* 7.7* 7.6* 8.7*  HCT 25.3* 24.4* 22.3* 23.3* 26.4*  MCV 88.2 85.9 85.4 88.6 87.7  PLT 216 183 154 172 224      Lab Results  Component Value Date   HEPBSAG NON REACTIVE 04/14/2020   HEPBIGM  06/12/2010    NEGATIVE (NOTE) High levels of Hepatitis B Core IgM antibody are detectable during the acute stage of Hepatitis B. This antibody is used to differentiate current from past HBV infection.       Microbiology:  Recent Results (from  the past 240 hour(s))  Aerobic/Anaerobic Culture (surgical/deep wound)     Status: None   Collection Time: 06/13/20  2:23 PM   Specimen: Wound; Abscess  Result Value Ref Range Status   Specimen Description   Final    WOUND Performed at [facility], [address], [place], [place] [address]    Special Requests   Final    RIGHT ARM PERI GRAFT ABSCESS Performed at [facility], [place], [place] [address]    Gram Stain NO WBC SEEN NO ORGANISMS SEEN   Final   Culture   Final    RARE ENTEROCOCCUS FAECALIS NO ANAEROBES ISOLATED Performed at [facility], [place] [address], [place], [place] [address]    Report Status 06/18/2020 FINAL  Final   Organism ID, Bacteria ENTEROCOCCUS FAECALIS  Final      Susceptibility   Enterococcus faecalis - MIC*    AMPICILLIN <=2 SENSITIVE Sensitive     VANCOMYCIN 1 SENSITIVE Sensitive     GENTAMICIN SYNERGY SENSITIVE Sensitive     * RARE ENTEROCOCCUS FAECALIS    Coagulation Studies: No results for input(s): LABPROT, INR in the last 72 hours.  Urinalysis: No results for input(s): COLORURINE, LABSPEC, PHURINE, GLUCOSEU, HGBUR, BILIRUBINUR, KETONESUR, PROTEINUR, UROBILINOGEN, NITRITE, LEUKOCYTESUR in the last 72 hours.  Invalid input(s): APPERANCEUR    Imaging: No results found.   Medications:   . sodium chloride 200 mL  (06/21/20 1151)  . [START ON 07/02/2020] vancomycin     . aspirin EC  81 mg Oral Daily  . atorvastatin  20 mg Oral Daily  . carvedilol  6.25 mg Oral BID WC  . chlorhexidine gluconate (MEDLINE KIT)  15 mL Mouth Rinse BID  . Chlorhexidine Gluconate Cloth  6 each Topical Daily  . cholestyramine light  4 g Oral BID  . dextrose  1 ampule Intravenous Once  . epoetin (EPOGEN/PROCRIT) injection  10,000 Units Intravenous Q M,W,F-HD  . feeding supplement (ENSURE ENLIVE)  237 mL Oral Q24H  . ferrous sulfate  325 mg Oral Daily  . gabapentin  100 mg Oral QPM  . heparin  5,000 Units Subcutaneous Q8H  . hydrALAZINE  25 mg Oral Q8H  . hydrOXYzine  25 mg Oral Daily  . lipase/protease/amylase  12,000 Units Oral TID WC  . multivitamin  1 tablet Oral Daily  . pantoprazole  40 mg Oral QHS  . cyanocobalamin  1,000 mcg Oral Daily   sodium chloride, acetaminophen **OR** acetaminophen, calcium carbonate, camphor-menthol **AND** hydrOXYzine, haloperidol lactate, hydrALAZINE, ipratropium-albuterol, loperamide, naLOXone (NARCAN)  injection, ondansetron **OR** [DISCONTINUED] ondansetron (ZOFRAN) IV, oxyCODONE-acetaminophen, promethazine, sorbitol, zolpidem  Assessment/ Plan:  59 y.o. female was admitted on 06/02/2020 for  Principal Problem:   Bacteremia due to Enterococcus Active Problems:   Acute respiratory failure with hypoxia (HCC)   Anemia of chronic disease   Acute diastolic CHF (congestive heart failure) (HCC)   COPD (chronic obstructive pulmonary disease) (HCC)   ESRD on hemodialysis (HCC)   Benign essential HTN   Obesity (BMI 30-39.9)  ESRD (end stage renal disease) ([place]) [N18.6] CAP (community acquired pneumonia) [J18.9] ESRD on dialysis ([place]) [N18.6, Z99.2] Dyspnea, unspecified type [R06.00] Acute on chronic congestive heart failure, unspecified heart failure type ([place]) [I50.9]  #. ESRD Patient has switched to [facility] as outpatient -Pt due for HD treatment again on Monday.   #.  Anemia of CKD  Lab Results  Component Value Date   HGB 8.7 (L) 06/23/2020   Maintain Epogen 10,000  units with HD MWF   #. Secondary hyperparathyroidism of renal origin N 25.81      Component Value Date/Time   PTH 357 (H) 10/05/2018 1241   Lab Results  Component Value Date   PHOS 3.1 06/19/2020   Phos under good control, continue to monitor.   #. Diabetes type 2 with CKD Hemoglobin A1C (%)  Date Value  07/19/2013 9.3 (H)   Hgb A1c MFr Bld (%)  Date Value  05/03/2020 5.7 (H)    #Enterococcal bacteremia TEE Negative for endocarditis Plan to start vancomycin post dialysis for 4 weeks per ID recommendation. 1500 mg till 07/11/20    LOS: 20 [name] 10/2/20211:57 PM  [employer], [place]   \

## 2020-06-24 LAB — GLUCOSE, CAPILLARY: Glucose-Capillary: 91 mg/dL (ref 70–99)

## 2020-06-24 MED ORDER — HYDRALAZINE HCL 25 MG PO TABS
25.0000 mg | ORAL_TABLET | Freq: Once | ORAL | Status: AC
  Administered 2020-06-24: 25 mg via ORAL
  Filled 2020-06-24: qty 1

## 2020-06-24 MED ORDER — AMLODIPINE BESYLATE 10 MG PO TABS
10.0000 mg | ORAL_TABLET | Freq: Every day | ORAL | Status: DC
  Administered 2020-06-24 – 2020-06-25 (×2): 10 mg via ORAL
  Filled 2020-06-24 (×2): qty 1

## 2020-06-24 MED ORDER — HYDRALAZINE HCL 50 MG PO TABS
50.0000 mg | ORAL_TABLET | Freq: Three times a day (TID) | ORAL | Status: DC
  Administered 2020-06-24 – 2020-06-25 (×3): 50 mg via ORAL
  Filled 2020-06-24 (×3): qty 1

## 2020-06-24 MED ORDER — HYDRALAZINE HCL 20 MG/ML IJ SOLN: 10.0000 mg | Freq: Four times a day (QID) | INTRAMUSCULAR | Status: DC | PRN

## 2020-06-24 NOTE — Plan of Care (Signed)
Continuing with plan of care. 

## 2020-06-24 NOTE — Progress Notes (Signed)
[facility], [place] 06/24/20  Subjective:   LOS: 21  Patient states that she is now willing to take the COVID-19 vaccine. This is only administered Monday through Friday between 10 the 2. Due for dialysis treatment tomorrow.   Objective:  Vital signs in last 24 hours:  Temp:  [97.7 F (36.5 C)-98.4 F (36.9 C)] 97.9 F (36.6 C) (10/03 1231) Pulse Rate:  [57-62] 62 (10/03 1231) Resp:  [16-20] 20 (10/03 1231) BP: (153-185)/(62-72) 185/72 (10/03 1231) SpO2:  [97 %-100 %] 100 % (10/03 1231)  Weight change:  Filed Weights   06/15/20 0417 06/19/20 1422 06/22/20 0500  Weight: 81.4 kg 81.4 kg 82.8 kg    Intake/Output:   No intake or output data in the 24 hours ending 06/24/20 1506   Physical Exam: General:  No acute distress  HEENT  Oral mucous membranes moist  Pulm/lungs  Lungs clear bilaterally  CVS/Heart  S1S2 no rub or gallop  Abdomen:   Soft,non distended  Extremities:  No peripheral edema  Neurologic:  Alert, awake, answers questions appropriately  Skin:  No acute rashes or lesions  Access:  Lt Chest Permcath       Basic Metabolic Panel:  Recent Labs  Lab 06/18/20 1726 06/18/20 1726 06/19/20 0707 06/19/20 0707 06/20/20 0634 06/21/20 0644 06/22/20 0547 06/23/20 1053  NA 134*  135  --  135  --  135 138 135  --   K 3.9  4.0  --  3.9  --  4.1 4.0 4.0  --   CL 96*  96*  --  97*  --  99 100 100  --   CO2 26  27  --  25  --  25 27 25   --   GLUCOSE 92  95  --  63*  --  83 86 60*  --   BUN 22  23  --  16  --  22 12 15   --   CREATININE 4.77*  4.83*  --  3.92*  --  4.99* 3.49* 4.44*  --   CALCIUM 8.4*  8.5*   < > 8.4*   < > 8.1* 8.2* 8.4*  --   MG 1.9   < > 2.1  --  2.0 1.9 2.1 1.9  PHOS 3.5  --  3.1  --   --   --   --   --    < > = values in this interval not displayed.     CBC: Recent Labs  Lab 06/18/20 1726 06/20/20 0634 06/21/20 0644 06/22/20 0547 06/23/20 1053  WBC 6.2 4.4 3.7* 3.9* 5.4  HGB 8.2* 8.2* 7.7*  7.6* 8.7*  HCT 25.3* 24.4* 22.3* 23.3* 26.4*  MCV 88.2 85.9 85.4 88.6 87.7  PLT 216 183 154 172 224      Lab Results  Component Value Date   HEPBSAG NON REACTIVE 04/14/2020   HEPBIGM  06/12/2010    NEGATIVE (NOTE) High levels of Hepatitis B Core IgM antibody are detectable during the acute stage of Hepatitis B. This antibody is used to differentiate current from past HBV infection.       Microbiology:  No results found for this or any previous visit (from the past 240 hour(s)).  Coagulation Studies: No results for input(s): LABPROT, INR in the last 72 hours.  Urinalysis: No results for input(s): COLORURINE, LABSPEC, PHURINE, GLUCOSEU, HGBUR, BILIRUBINUR, KETONESUR, PROTEINUR, UROBILINOGEN, NITRITE, LEUKOCYTESUR in the last 72 hours.  Invalid input(s): APPERANCEUR    Imaging: No  results found.   Medications:   . sodium chloride 200 mL (06/21/20 1151)  . [START ON 07/02/2020] vancomycin     . aspirin EC  81 mg Oral Daily  . atorvastatin  20 mg Oral Daily  . carvedilol  6.25 mg Oral BID WC  . chlorhexidine gluconate (MEDLINE KIT)  15 mL Mouth Rinse BID  . Chlorhexidine Gluconate Cloth  6 each Topical Daily  . cholestyramine light  4 g Oral BID  . dextrose  1 ampule Intravenous Once  . epoetin (EPOGEN/PROCRIT) injection  10,000 Units Intravenous Q M,W,F-HD  . feeding supplement (ENSURE ENLIVE)  237 mL Oral Q24H  . ferrous sulfate  325 mg Oral Daily  . gabapentin  100 mg Oral QPM  . heparin  5,000 Units Subcutaneous Q8H  . hydrALAZINE  50 mg Oral Q8H  . hydrOXYzine  25 mg Oral Daily  . lipase/protease/amylase  12,000 Units Oral TID WC  . multivitamin  1 tablet Oral Daily  . pantoprazole  40 mg Oral QHS  . sevelamer carbonate  800 mg Oral TID WC  . cyanocobalamin  1,000 mcg Oral Daily   sodium chloride, acetaminophen **OR** acetaminophen, calcium carbonate, camphor-menthol **AND** hydrOXYzine, haloperidol lactate, hydrALAZINE, ipratropium-albuterol, loperamide,  naLOXone (NARCAN)  injection, ondansetron **OR** [DISCONTINUED] ondansetron (ZOFRAN) IV, oxyCODONE-acetaminophen, promethazine, sorbitol, zolpidem  Assessment/ Plan:  59 y.o. female was admitted on 06/02/2020 for  Principal Problem:   Bacteremia due to Enterococcus Active Problems:   Acute respiratory failure with hypoxia (HCC)   Anemia of chronic disease   Acute diastolic CHF (congestive heart failure) (HCC)   COPD (chronic obstructive pulmonary disease) (HCC)   ESRD on hemodialysis (HCC)   Benign essential HTN   Obesity (BMI 30-39.9)  ESRD (end stage renal disease) ([place]) [N18.6] CAP (community acquired pneumonia) [J18.9] ESRD on dialysis ([place]) [N18.6, Z99.2] Dyspnea, unspecified type [R06.00] Acute on chronic congestive heart failure, unspecified heart failure type ([place]) [I50.9]  #. ESRD Patient has switched to [facility] as outpatient -Volume status acceptable.  No immediate need for dialysis today.  We will plan for dialysis treatment tomorrow.  #. Anemia of CKD  Lab Results  Component Value Date   HGB 8.7 (L) 06/23/2020   Maintain Epogen 10,000 units with HD MWF   #. Secondary hyperparathyroidism of renal origin N 25.81      Component Value Date/Time   PTH 357 (H) 10/05/2018 1241   Lab Results  Component Value Date   PHOS 3.1 06/19/2020   Recheck serum phosphorus tomorrow.  #. Diabetes type 2 with CKD Hemoglobin A1C (%)  Date Value  07/19/2013 9.3 (H)   Hgb A1c MFr Bld (%)  Date Value  05/03/2020 5.7 (H)  Glycemic control per hospitalist.  #Enterococcal bacteremia TEE Negative for endocarditis Plan to start vancomycin post dialysis for 4 weeks per ID recommendation. Vancomycin 1500 mg till 07/11/20    LOS: 21 Lilah Mijangos 10/3/20213:06 PM  Maalaea Corry, [place]   \

## 2020-06-24 NOTE — TOC Progression Note (Signed)
Transition of Care [facility]) - Progression Note    Patient Details  Name: [name] MRN: [id number] Date of Birth: 01-Aug-1961  Transition of Care [facility]) CM/SW Contact  Meriel Flavors, LCSW Phone Number: 06/24/2020, 10:32 AM  Clinical Narrative:    CSW spoke with Tanya/[facility] to confirm Auth has been started and patient can discharge when medically stable.        Expected Discharge Plan and Services                                                 Social Determinants of Health (SDOH) Interventions    Readmission Risk Interventions Readmission Risk Prevention Plan 06/06/2020 05/03/2020  Transportation Screening Complete Complete  Medication Review [employer]) Complete Complete  PCP or Specialist appointment within 3-5 days of discharge Complete Complete  HRI or Home Care Consult Complete -  SW Recovery Care/Counseling Consult - Complete  Palliative Care Screening Not Applicable Not [place] Not Applicable Not Applicable  Some recent data might be hidden

## 2020-06-24 NOTE — Progress Notes (Signed)
PROGRESS NOTE    [name]   JME:[id number]  DOB: Feb 16, 1961  PCP: [facility], [place]    DOA: 06/02/2020 LOS: 58   Brief Narrative   59 year old female with past medical history of ESRD on dialysis (MWF), dCHF, anemia, CAD, renal cell cancer, pancreatitis, anemia, hypertension, diabetes mellitus, COPD, asthma, depression, anxiety, who was admitted on 06/02/2020 with fever/chills, shortness of breath.  Admitted with pneumonia and volume overloaded after missing dialysis.  BP uncontrolled on presentation with systolic BP in 229'N.  She underwent dialysis the evening of admission.   Patient apparently refused blood draw for initial blood cultures, these were eventually obtained on 9/13 and returned positive for Enterococcus faecalis which is thought secondary to AV graft infection with abscess.  ID was consulted.   A temporary R femoral dialysis catheter was placed on 9/18.  TEE done on 9/20 was negative for endocarditis.  During the procedure, patient became hypotensive (systolic 98'X) and bradycardic (HR 20's), apparently was a near cardiac arrest, and was in acute severe respiratory failure.  Intubated and transferred to ICU, required vasopressors.   9/22 Vascular surgery removed the infected RUE AV graft and performed endarterectomy of the R brachial artery with Cormatrix patch.  Required 2 units pRBC transfusion due to blood loss.  9/23 patient successfully extubated.  Remained confused, but encephalopathy improving.  9/25 severe abdominal pain, concern for retroperitoneal hemorrhage which was ruled out by CT abdomen/pelvis.  Family reported patient's pain from gastroparesis presents in similar fashion.  Patient transferred out of ICU later on 9/25 to Progressive and TRH assumed care of patient on 9/26.    Assessment & Plan   Principal Problem:   Bacteremia due to Enterococcus Active Problems:   Acute respiratory failure with hypoxia (HCC)   Anemia of chronic  disease   Acute diastolic CHF (congestive heart failure) (HCC)   COPD (chronic obstructive pulmonary disease) (HCC)   ESRD on hemodialysis (HCC)   Benign essential HTN   Obesity (BMI 30-39.9)    Severe sepsis secondary to Enterococcus faecalis bacteremia  Source appears to have been infected AV graft which was removed 9/22. ID is following.   Was on Ampicillin + Gentamicin, then on Ampicillin monotherapy, then transitioned to IV vanc, per ID Repeat blood cultures are negative to date.  TEE neg for endocarditis. PLAN: --cont IV vanc, per ID --after discharge, will give IV vanc with dialysis to complete 4 weeks, end date 07/19/20  Acute Hypoxic  Respiratory Failure - Resolved - extubated 9/23, on room air  Hypotension, resolved off vasopressors   Acute on chronic diastolic CHF / Volume Overload - volume being managed with dialysis and has improved.  Now appears euvolemic.    Acute toxic/metabolic encephalopathy - appears was mostly due to sedating medications.  Mentation improving. --Delirium precautions:     -Lights and TV off, minimize interruptions at night    -Blinds open and lights on during day    -Glasses/hearing aid with patient    -Frequent reorientation    -PT/OT when able    -Avoid sedation medications/Beers list medications   Generalized weakness -  PT/OT rec SNF rehab.   Type 2 Diabetes with gastroparesis  Hypoglycemia - Hbg A1c 5.7 in 04/2020, well controlled.  Not on any insulin coverage. --BG checks every morning for hypoglycemia.  ESRD on dialysis - M/W/F dialysis.  Nephrology following. --PermCath placed 9/28, temp femoral cath removed after --iHD per nephrology  Acute blood loss anemia superimposed on anemia of  chronic renal disease - pt required blood transfusions after AV graft revision, s/p 2 units pRBCs 9/23 and 9/24.  Hbg currently at/near baseline. --continue Epo with dialysis --Monitor Hgb every other day  COPD - not acutely exacerbated.   Continue PRN albuterol.  Hypertension - uncontrolled BP became severely elevated today, >180 PLAN: --Continue coreg --increase hydralazine to 50 mg TID --Add amlodipine 10 mg daily --Hold home lasix   Obesity: Body mass index is 30.38 kg/m.  Complicates overall care and prognosis.     DVT prophylaxis: heparin injection 5,000 Units Start: 06/02/20 2300   Diet:  Diet Orders (From admission, onward)    Start     Ordered   06/19/20 2006  Diet regular Room service appropriate? Yes; Fluid consistency: Thin  Diet effective now       Question Answer Comment  Room service appropriate? Yes   Fluid consistency: Thin      06/19/20 2005            Code Status: Full Code    Subjective 06/24/20   Complained of nausea, no abdominal pain.    Agreeable to COVID vaccine now.   Disposition Plan & Communication   DVT prophylaxis: Heparin SQ Code Status: Full code  Family Communication:  Status is: inpatient Dispo:   The patient is from: home Anticipated d/c is to: SNF Anticipated d/c date is: whenever bed available Patient currently is medically stable to d/c.    Discharge barrier: SNF requires that pt be vaccinated, otherwise needs to be isolated, which is not possible given dialysis needs.  Pt now agrees to vaccine.  Will get first dose on Monday, and SNF may take pt later in the week.   Consults, Procedures, Significant Events   Consultants:   Nephrology  PCCM  Vascular surgery  Cardiology  Procedures:   9/18 temporary femoral dialysis cath placed  9/20 TEE, intubated  9/22 removal of AV graft & R brachial endarterectomy  Dialysis  Antimicrobials:  Anti-infectives (From admission, onward)   Start     Dose/Rate Route Frequency Ordered Stop   07/02/20 1200  vancomycin (VANCOREADY) IVPB 750 mg/150 mL        750 mg 150 mL/hr over 60 Minutes Intravenous Every M-W-F (Hemodialysis) 06/22/20 1022     06/22/20 1800  vancomycin (VANCOREADY) IVPB 1500 mg/300 mL         1,500 mg 150 mL/hr over 120 Minutes Intravenous  Once 06/22/20 1022 06/22/20 1922   06/20/20 0000  ceFAZolin (ANCEF) IVPB 1 g/50 mL premix       Note to Pharmacy: To be given in specials   1 g 100 mL/hr over 30 Minutes Intravenous  Once 06/19/20 1430 06/19/20 1445   06/16/20 1200  gentamicin (GARAMYCIN) IVPB 80 mg  Status:  Discontinued        80 mg 100 mL/hr over 30 Minutes Intravenous Every T-Th-Sa (Hemodialysis) 06/15/20 0803 06/15/20 1350   06/15/20 1200  gentamicin (GARAMYCIN) IVPB 80 mg  Status:  Discontinued        80 mg 100 mL/hr over 30 Minutes Intravenous Every M-W-F (Hemodialysis) 06/13/20 1404 06/15/20 0803   06/14/20 1800  gentamicin (GARAMYCIN) 60 mg in dextrose 5 % 50 mL IVPB        60 mg 103 mL/hr over 30 Minutes Intravenous  Once 06/14/20 1643 06/14/20 1819   06/13/20 1800  gentamicin (GARAMYCIN) IVPB 80 mg  Status:  Discontinued        80 mg 100 mL/hr over 30  Minutes Intravenous Every M-W-F (Hemodialysis) 06/12/20 1402 06/13/20 1404   06/13/20 0600  vancomycin (VANCOCIN) IVPB 1000 mg/200 mL premix        1,000 mg 200 mL/hr over 60 Minutes Intravenous On call to O.R. 06/13/20 0114 06/13/20 1350   06/12/20 1600  gentamicin (GARAMYCIN) IVPB 60 mg  Status:  Discontinued        60 mg 100 mL/hr over 30 Minutes Intravenous  Once 06/12/20 1402 06/12/20 1412   06/12/20 1600  gentamicin (GARAMYCIN) 60 mg in dextrose 5 % 50 mL IVPB        60 mg 103 mL/hr over 30 Minutes Intravenous  Once 06/12/20 1412 06/12/20 2105   06/11/20 1200  gentamicin (GARAMYCIN) IVPB 80 mg  Status:  Discontinued        80 mg 100 mL/hr over 30 Minutes Intravenous Every M-W-F (Hemodialysis) 06/08/20 1356 06/12/20 1402   06/10/20 1900  gentamicin (GARAMYCIN) IVPB 80 mg        80 mg 100 mL/hr over 30 Minutes Intravenous  Once 06/10/20 1722 06/11/20 0056   06/08/20 1800  gentamicin (GARAMYCIN) IVPB 100 mg  Status:  Discontinued        100 mg 200 mL/hr over 30 Minutes Intravenous  Once 06/08/20  1356 06/08/20 1425   06/08/20 1800  gentamicin (GARAMYCIN) IVPB 100 mg  Status:  Discontinued        100 mg 200 mL/hr over 30 Minutes Intravenous  Once 06/08/20 1425 06/08/20 1430   06/08/20 1800  gentamicin (GARAMYCIN) IVPB 100 mg  Status:  Discontinued        100 mg 200 mL/hr over 30 Minutes Intravenous  Once 06/08/20 1430 06/08/20 1441   06/08/20 1800  gentamicin (GARAMYCIN) 100 mg in dextrose 5 % 50 mL IVPB        100 mg 105 mL/hr over 30 Minutes Intravenous  Once 06/08/20 1441 06/08/20 1927   06/08/20 1500  ampicillin (OMNIPEN) 2 g in sodium chloride 0.9 % 100 mL IVPB  Status:  Discontinued        2 g 300 mL/hr over 20 Minutes Intravenous Every 12 hours 06/08/20 1356 06/22/20 1012   06/07/20 2200  piperacillin-tazobactam (ZOSYN) IVPB 2.25 g  Status:  Discontinued        2.25 g 100 mL/hr over 30 Minutes Intravenous Every 8 hours 06/07/20 1657 06/08/20 1010   06/07/20 1400  ampicillin (OMNIPEN) 2 g in sodium chloride 0.9 % 100 mL IVPB  Status:  Discontinued        2 g 300 mL/hr over 20 Minutes Intravenous Every 8 hours 06/07/20 1025 06/07/20 1637   06/06/20 1600  piperacillin-tazobactam (ZOSYN) IVPB 2.25 g  Status:  Discontinued        2.25 g 100 mL/hr over 30 Minutes Intravenous Every 8 hours 06/06/20 1403 06/07/20 1025   06/03/20 1400  azithromycin (ZITHROMAX) 500 mg in sodium chloride 0.9 % 250 mL IVPB  Status:  Discontinued        500 mg 250 mL/hr over 60 Minutes Intravenous Every 24 hours 06/03/20 1322 06/06/20 1319   06/03/20 1400  cefTRIAXone (ROCEPHIN) 1 g in sodium chloride 0.9 % 100 mL IVPB  Status:  Discontinued        1 g 200 mL/hr over 30 Minutes Intravenous Every 24 hours 06/03/20 1322 06/06/20 1319       Significant Events: From Dr. Patsey Berthold note on 9/25: "9/20 TEE performed Tee revealed no evidence of endocartidits. Procedure complicated by hypotension and bradycaradia with  heart rates dropping to mid 20's and blood pressure systolic to 46F. Received 1 amp of  atropine, amp of epinephrine and neosynephrine. Pt intubated to protect air way. Stat head ct and chest ct were unremarkable. Pt son notified immediately and has been updated with care. 9/21 remains on vent, plan for SAT/SBT 9/22 plan for OR today  MRI BRAIN-IMPRESSION: 1. No acute intracranial abnormality. 2. Few small remote lacunar infarcts involving the pons. 3. Otherwise normal brain MRI for age. 9/23 HD performed patient extubated after successful SAT/SBT 9/24 uneventful day, still somewhat encephalopathic but clearing"  Objective   Vitals:   06/23/20 1656 06/23/20 2034 06/24/20 0505 06/24/20 1231  BP: (!) 177/62 (!) 153/66 (!) 174/68 (!) 185/72  Pulse: 60 (!) 57 (!) 58 62  Resp: _0 Temp: 98.1 F (36.7 C) 98.4 F (36.9 C) 97.7 F (36.5 C) 97.9 F (36.6 C)  TempSrc: Oral Oral Oral Oral  SpO2: 99% 100% 97% 100%  Weight:      Height:       No intake or output data in the 24 hours ending 06/24/20 1506 Filed Weights   06/15/20 0417 06/19/20 1422 06/22/20 0500  Weight: 81.4 kg 81.4 kg 82.8 kg    Physical Exam:  Constitutional: NAD, AAOx3 HEENT: conjunctivae and lids normal, EOMI CV: No cyanosis.   RESP: normal respiratory effort, on RA Extremities: No effusions, edema in BLE SKIN: warm, dry and intact Neuro: II - XII grossly intact.   Psych: depressed mood and affect.    Labs   Data Reviewed: I have personally reviewed following labs and imaging studies  CBC: Recent Labs  Lab 06/18/20 1726 06/20/20 0634 06/21/20 0644 06/22/20 0547 06/23/20 1053  WBC 6.2 4.4 3.7* 3.9* 5.4  HGB 8.2* 8.2* 7.7* 7.6* 8.7*  HCT 25.3* 24.4* 22.3* 23.3* 26.4*  MCV 88.2 85.9 85.4 88.6 87.7  PLT 216 183 154 172 681   Basic Metabolic Panel: Recent Labs  Lab 06/18/20 1726 06/18/20 1726 06/19/20 0707 06/20/20 0634 06/21/20 0644 06/22/20 0547 06/23/20 1053  NA 134*  135  --  135 135 138 135  --   K 3.9  4.0  --  3.9 4.1 4.0 4.0  --   CL 96*  96*  --  97* 99 100  100  --   CO2 26  27  --  _1 --   GLUCOSE 92  95  --  63* 83 86 60*  --   BUN 22  23  --  _2 --   CREATININE 4.77*  4.83*  --  3.92* 4.99* 3.49* 4.44*  --   CALCIUM 8.4*  8.5*  --  8.4* 8.1* 8.2* 8.4*  --   MG 1.9   < > 2.1 2.0 1.9 2.1 1.9  PHOS 3.5  --  3.1  --   --   --   --    < > = values in this interval not displayed.   GFR: Estimated Creatinine Clearance: 14.1 mL/min (A) (by C-G formula based on SCr of 4.44 mg/dL (H)). Liver Function Tests: Recent Labs  Lab 06/18/20 1726 06/19/20 0707  ALBUMIN 2.3* 2.3*   No results for input(s): LIPASE, AMYLASE in the last 168 hours. No results for input(s): AMMONIA in the last 168 hours. Coagulation Profile: No results for input(s): INR, PROTIME in the last 168 hours. Cardiac Enzymes: No results for input(s): CKTOTAL, CKMB, CKMBINDEX, TROPONINI in the last 168  hours. BNP (last 3 results) No results for input(s): PROBNP in the last 8760 hours. HbA1C: No results for input(s): HGBA1C in the last 72 hours. CBG: Recent Labs  Lab 06/23/20 0807 06/23/20 1212 06/23/20 1654 06/23/20 2028 06/24/20 0735  GLUCAP 67* 86 66* 77 91   Lipid Profile: No results for input(s): CHOL, HDL, LDLCALC, TRIG, CHOLHDL, LDLDIRECT in the last 72 hours. Thyroid Function Tests: No results for input(s): TSH, T4TOTAL, FREET4, T3FREE, THYROIDAB in the last 72 hours. Anemia Panel: No results for input(s): VITAMINB12, FOLATE, FERRITIN, TIBC, IRON, RETICCTPCT in the last 72 hours. Sepsis Labs: No results for input(s): PROCALCITON, LATICACIDVEN in the last 168 hours.  No results found for this or any previous visit (from the past 240 hour(s)).    Imaging Studies   No results found.   Medications   Scheduled Meds: . aspirin EC  81 mg Oral Daily  . atorvastatin  20 mg Oral Daily  . carvedilol  6.25 mg Oral BID WC  . chlorhexidine gluconate (MEDLINE KIT)  15 mL Mouth Rinse BID  . Chlorhexidine Gluconate Cloth  6 each Topical  Daily  . cholestyramine light  4 g Oral BID  . dextrose  1 ampule Intravenous Once  . epoetin (EPOGEN/PROCRIT) injection  10,000 Units Intravenous Q M,W,F-HD  . feeding supplement (ENSURE ENLIVE)  237 mL Oral Q24H  . ferrous sulfate  325 mg Oral Daily  . gabapentin  100 mg Oral QPM  . heparin  5,000 Units Subcutaneous Q8H  . hydrALAZINE  50 mg Oral Q8H  . hydrOXYzine  25 mg Oral Daily  . lipase/protease/amylase  12,000 Units Oral TID WC  . multivitamin  1 tablet Oral Daily  . pantoprazole  40 mg Oral QHS  . sevelamer carbonate  800 mg Oral TID WC  . cyanocobalamin  1,000 mcg Oral Daily   Continuous Infusions: . sodium chloride 200 mL (06/21/20 1151)  . [START ON 07/02/2020] vancomycin         LOS: 21 days     Enzo Bi, MD [facility]  06/24/2020, 3:06 PM

## 2020-06-25 ENCOUNTER — Ambulatory Visit: Payer: Medicare HMO | Attending: Hospitalist

## 2020-06-25 DIAGNOSIS — I5033 Acute on chronic diastolic (congestive) heart failure: Secondary | ICD-10-CM | POA: Diagnosis not present

## 2020-06-25 DIAGNOSIS — M6281 Muscle weakness (generalized): Secondary | ICD-10-CM | POA: Diagnosis not present

## 2020-06-25 DIAGNOSIS — D631 Anemia in chronic kidney disease: Secondary | ICD-10-CM | POA: Diagnosis not present

## 2020-06-25 DIAGNOSIS — Z5181 Encounter for therapeutic drug level monitoring: Secondary | ICD-10-CM | POA: Diagnosis not present

## 2020-06-25 DIAGNOSIS — J449 Chronic obstructive pulmonary disease, unspecified: Secondary | ICD-10-CM | POA: Diagnosis not present

## 2020-06-25 DIAGNOSIS — R1311 Dysphagia, oral phase: Secondary | ICD-10-CM | POA: Diagnosis not present

## 2020-06-25 DIAGNOSIS — K861 Other chronic pancreatitis: Secondary | ICD-10-CM | POA: Diagnosis not present

## 2020-06-25 DIAGNOSIS — I5030 Unspecified diastolic (congestive) heart failure: Secondary | ICD-10-CM | POA: Diagnosis not present

## 2020-06-25 DIAGNOSIS — Z992 Dependence on renal dialysis: Secondary | ICD-10-CM | POA: Diagnosis not present

## 2020-06-25 DIAGNOSIS — M2550 Pain in unspecified joint: Secondary | ICD-10-CM | POA: Diagnosis not present

## 2020-06-25 DIAGNOSIS — E0840 Diabetes mellitus due to underlying condition with diabetic neuropathy, unspecified: Secondary | ICD-10-CM | POA: Diagnosis not present

## 2020-06-25 DIAGNOSIS — T82590D Other mechanical complication of surgically created arteriovenous fistula, subsequent encounter: Secondary | ICD-10-CM | POA: Diagnosis not present

## 2020-06-25 DIAGNOSIS — M6259 Muscle wasting and atrophy, not elsewhere classified, multiple sites: Secondary | ICD-10-CM | POA: Diagnosis not present

## 2020-06-25 DIAGNOSIS — E1122 Type 2 diabetes mellitus with diabetic chronic kidney disease: Secondary | ICD-10-CM | POA: Diagnosis not present

## 2020-06-25 DIAGNOSIS — R7881 Bacteremia: Secondary | ICD-10-CM | POA: Diagnosis not present

## 2020-06-25 DIAGNOSIS — B952 Enterococcus as the cause of diseases classified elsewhere: Secondary | ICD-10-CM | POA: Diagnosis not present

## 2020-06-25 DIAGNOSIS — Z23 Encounter for immunization: Secondary | ICD-10-CM

## 2020-06-25 DIAGNOSIS — T827XXA Infection and inflammatory reaction due to other cardiac and vascular devices, implants and grafts, initial encounter: Secondary | ICD-10-CM | POA: Diagnosis not present

## 2020-06-25 DIAGNOSIS — N186 End stage renal disease: Secondary | ICD-10-CM | POA: Diagnosis not present

## 2020-06-25 DIAGNOSIS — I959 Hypotension, unspecified: Secondary | ICD-10-CM | POA: Diagnosis not present

## 2020-06-25 DIAGNOSIS — I120 Hypertensive chronic kidney disease with stage 5 chronic kidney disease or end stage renal disease: Secondary | ICD-10-CM | POA: Diagnosis not present

## 2020-06-25 DIAGNOSIS — A4181 Sepsis due to Enterococcus: Secondary | ICD-10-CM | POA: Diagnosis not present

## 2020-06-25 DIAGNOSIS — Z7401 Bed confinement status: Secondary | ICD-10-CM | POA: Diagnosis not present

## 2020-06-25 DIAGNOSIS — Z79899 Other long term (current) drug therapy: Secondary | ICD-10-CM | POA: Diagnosis not present

## 2020-06-25 DIAGNOSIS — E46 Unspecified protein-calorie malnutrition: Secondary | ICD-10-CM | POA: Diagnosis not present

## 2020-06-25 DIAGNOSIS — J189 Pneumonia, unspecified organism: Secondary | ICD-10-CM | POA: Diagnosis not present

## 2020-06-25 DIAGNOSIS — N2581 Secondary hyperparathyroidism of renal origin: Secondary | ICD-10-CM | POA: Diagnosis not present

## 2020-06-25 DIAGNOSIS — R0602 Shortness of breath: Secondary | ICD-10-CM | POA: Diagnosis not present

## 2020-06-25 LAB — RESPIRATORY PANEL BY RT PCR (FLU A&B, COVID)
Influenza A by PCR: NEGATIVE
Influenza B by PCR: NEGATIVE
SARS Coronavirus 2 by RT PCR: NEGATIVE

## 2020-06-25 LAB — CBC
HCT: 24.3 % — ABNORMAL LOW (ref 36.0–46.0)
Hemoglobin: 7.9 g/dL — ABNORMAL LOW (ref 12.0–15.0)
MCH: 29.3 pg (ref 26.0–34.0)
MCHC: 32.5 g/dL (ref 30.0–36.0)
MCV: 90 fL (ref 80.0–100.0)
Platelets: 218 10*3/uL (ref 150–400)
RBC: 2.7 MIL/uL — ABNORMAL LOW (ref 3.87–5.11)
RDW: 18.7 % — ABNORMAL HIGH (ref 11.5–15.5)
WBC: 5.1 10*3/uL (ref 4.0–10.5)
nRBC: 0 % (ref 0.0–0.2)

## 2020-06-25 LAB — BASIC METABOLIC PANEL
Anion gap: 12 (ref 5–15)
BUN: 14 mg/dL (ref 8–23)
CO2: 24 mmol/L (ref 22–32)
Calcium: 8.5 mg/dL — ABNORMAL LOW (ref 8.9–10.3)
Chloride: 96 mmol/L — ABNORMAL LOW (ref 98–111)
Creatinine, Ser: 4.82 mg/dL — ABNORMAL HIGH (ref 0.44–1.00)
GFR calc Af Amer: 11 mL/min — ABNORMAL LOW (ref >60)
GFR calc non Af Amer: 9 mL/min — ABNORMAL LOW (ref >60)
Glucose, Bld: 76 mg/dL (ref 70–99)
Potassium: 3.9 mmol/L (ref 3.5–5.1)
Sodium: 132 mmol/L — ABNORMAL LOW (ref 135–145)

## 2020-06-25 LAB — GLUCOSE, CAPILLARY
Glucose-Capillary: 58 mg/dL — ABNORMAL LOW (ref 70–99)
Glucose-Capillary: 79 mg/dL (ref 70–99)
Glucose-Capillary: 64 mg/dL — ABNORMAL LOW (ref 70–99)
Glucose-Capillary: 88 mg/dL (ref 70–99)

## 2020-06-25 LAB — MAGNESIUM: Magnesium: 2 mg/dL (ref 1.7–2.4)

## 2020-06-25 MED ORDER — POLYETHYLENE GLYCOL 3350 17 G PO PACK: 17.0000 g | PACK | Freq: Every day | ORAL | 0 refills | Status: DC

## 2020-06-25 MED ORDER — ENSURE ENLIVE PO LIQD: 237.0000 mL | ORAL | 12 refills | Status: DC

## 2020-06-25 MED ORDER — CALCIUM CARBONATE ANTACID 500 MG PO CHEW
1.0000 | CHEWABLE_TABLET | Freq: Three times a day (TID) | ORAL | Status: DC | PRN
  Administered 2020-06-25: 200 mg via ORAL
  Filled 2020-06-25: qty 1

## 2020-06-25 MED ORDER — VANCOMYCIN HCL 750 MG/150ML IV SOLN: 750.0000 mg | INTRAVENOUS | Status: AC

## 2020-06-25 MED ORDER — ALUM & MAG HYDROXIDE-SIMETH 200-200-20 MG/5ML PO SUSP: 15.0000 mL | Freq: Four times a day (QID) | ORAL | 0 refills | Status: DC | PRN

## 2020-06-25 MED ORDER — CALCIUM CARBONATE ANTACID 500 MG PO CHEW: 500.0000 mg | CHEWABLE_TABLET | Freq: Four times a day (QID) | ORAL | Status: DC | PRN

## 2020-06-25 MED ORDER — AMLODIPINE BESYLATE 10 MG PO TABS: 10.0000 mg | ORAL_TABLET | Freq: Every day | ORAL | Status: DC

## 2020-06-25 MED ORDER — POLYETHYLENE GLYCOL 3350 17 G PO PACK: 17.0000 g | PACK | Freq: Two times a day (BID) | ORAL | Status: DC | PRN

## 2020-06-25 MED ORDER — ALUM & MAG HYDROXIDE-SIMETH 200-200-20 MG/5ML PO SUSP
15.0000 mL | Freq: Four times a day (QID) | ORAL | Status: DC | PRN
  Administered 2020-06-25: 15 mL via ORAL
  Filled 2020-06-25: qty 30

## 2020-06-25 MED ORDER — GUAIFENESIN-DM 100-10 MG/5ML PO SYRP: 10.0000 mL | ORAL_SOLUTION | Freq: Four times a day (QID) | ORAL | Status: DC | PRN

## 2020-06-25 MED ORDER — HYDRALAZINE HCL 50 MG PO TABS: 50.0000 mg | ORAL_TABLET | Freq: Three times a day (TID) | ORAL | Status: DC

## 2020-06-25 MED ORDER — TRAMADOL HCL 50 MG PO TABS: 50.0000 mg | ORAL_TABLET | Freq: Three times a day (TID) | ORAL | 0 refills | Status: DC | PRN

## 2020-06-25 MED ORDER — EPOETIN ALFA 10000 UNIT/ML IJ SOLN
10000.0000 [IU] | INTRAMUSCULAR | Status: DC
Start: 2020-06-27 — End: 2022-09-15

## 2020-06-25 MED ORDER — PROMETHAZINE HCL 25 MG PO TABS: 25.0000 mg | ORAL_TABLET | Freq: Four times a day (QID) | ORAL | 0 refills | Status: DC | PRN

## 2020-06-25 MED ORDER — DOCUSATE SODIUM 100 MG PO CAPS: 100.0000 mg | ORAL_CAPSULE | Freq: Two times a day (BID) | ORAL | Status: DC | PRN

## 2020-06-25 NOTE — Discharge Summary (Signed)
Physician Discharge Summary   [name]  female DOB: 1961-06-20  FGH:[id number]  PCP: [facility] [place] date: 06/02/2020 Discharge date: 06/25/2020  Admitted From: home Disposition:  SNF CODE STATUS: Full code  Discharge Instructions    No wound care   Complete by: As directed        Hospital Course:  For full details, please see H&P, progress notes, consult notes and ancillary notes.  Briefly,  [name] is a 59 year old female with medical history of ESRD on dialysis (MWF),dCHF, anemia, CAD, renal cell cancer, pancreatitis, anemia, hypertension, diabetes mellitus, COPD, asthma, depression, anxiety, who was admitted on 06/02/2020 with fever/chills, shortness of breath.  Admitted with pneumonia and volume overloaded after missing dialysis.  BP uncontrolled on presentation with systolic BP in 678'L.  She underwent dialysis the evening of admission.  Patient apparently refused blood draw for initial blood cultures, these were eventually obtained on 9/13 and returned positive for Enterococcus faecalis which was thought secondary to AV graft infection with abscess.  ID was consulted.   A temporary R femoral dialysis catheter was placed on 9/18.  TEE done on 9/20 was negative for endocarditis.  During the procedure, patient became hypotensive (systolic 38'B) and bradycardic (HR 20's), apparently was a near cardiac arrest, and was in acute severe respiratory failure.  Intubated and transferred to ICU, required vasopressors.   On 9/22 Vascular surgery removed the infected RUE AV graft and performed endarterectomy of the R brachial artery with Cormatrix patch.  Required 2 units pRBC transfusion due to blood loss.  On 9/23 patient successfully extubated.  Patient transferred out of ICU on 9/25 to [facility] and [facility] assumed care of patient on 9/26.  Severe sepsis secondary to Enterococcus faecalis bacteremia  Source appeared to have been infected AV graft which  was removed 9/22.  Pt was on Ampicillin + Gentamicin, then on Ampicillin monotherapy, then transitioned to IV vanc, per ID.  Repeat blood cultures negative.  TEE neg for endocarditis.  After discharge, pt will continue IV Vanc with dialysis to complete 4 weeks, end date 07/19/20.  Acute Hypoxic Respiratory Failure - Resolved  In the setting of near cardiac arrest.  extubated 9/23, has been sating well on room air.  Hypotension, resolved off vasopressors   Acute on chronic diastolic CHF / Volume Overload, POA Initially presented with volume overload from missing dialysis.  Volume being managed with dialysis and has improved.  Now appears euvolemic.    Acute toxic/metabolic encephalopathy appeared due to sedating medications and acute illness.  Delirium precaution instituted.    Mentation improving and appeared back to baseline prior to discharge.  Hx of Type 2 Diabetes with gastroparesis  Recurrent Hypoglycemia episodes Hbg A1c 5.7 in 04/2020, well controlled.  Pt was not on any insulin coverage during hospitalization, however, had hypoglycemia daily, mostly early in the morning, with BG down to 50's-60's.  Pt was never symptomatic and BG normalized with juice.  Hypoglycemia likely due to poor oral intake.  ESRD on dialysis - M/W/F  Nephrology following.  PermCath placed 9/28, temp femoral cath removed after.  iHD per nephrology  Acute blood loss anemia superimposed on anemia of chronic renal disease  pt required blood transfusions after AV graft revision, s/p 2 units pRBCs 9/23 and 9/24.  Since then, Hgb had been stable in 7's and 8's.  continued Epo with dialysis.  COPD  not acutely exacerbated.  Continued PRN albuterol.  Hypertension - uncontrolled BP became severely  elevated on 34/7, systolic >425.  New BP meds were added during this hospitalization.  Continued home Coreg.  New Hydralazine 50 mg TID.  New amlodipine 10 mg daily.  Home Lasix resumed after discharge.  Obesity:  Body mass index is 30.38 kg/m.   Complicates overall care and prognosis.   Discharge Diagnoses:  Principal Problem:   Bacteremia due to Enterococcus Active Problems:   Acute respiratory failure with hypoxia (HCC)   Anemia of chronic disease   Acute diastolic CHF (congestive heart failure) (HCC)   COPD (chronic obstructive pulmonary disease) (HCC)   ESRD on hemodialysis (HCC)   Benign essential HTN   Obesity (BMI 30-39.9)    Discharge Instructions:  Allergies as of 06/25/2020      Reactions   Ace Inhibitors Swelling, Anaphylaxis   Ativan [lorazepam] Other (See Comments)   Reaction:Hallucinations and headaches   Compazine [prochlorperazine Edisylate] Anaphylaxis, Nausea And Vomiting, Other (See Comments)   Other reaction(s): dystonia from this vs. Reglan, 23 Jul - patient relates that she takes promethazine frequently with no problems   Sumatriptan Succinate Other (See Comments)   Other reaction(s): delirium and hallucinations per [facility] records   Zofran [ondansetron] Nausea And Vomiting   Per pt. she is allergic to zofran or will experience adverse reaction like hallucination    Losartan Nausea Only   Prochlorperazine Other (See Comments)   Reaction:  Unknown . Patient does not remember reaction but she does have vertigo and anxiety along with n and v at times. Could be used to treat any of these   Reglan [metoclopramide] Other (See Comments)   Per patient her Dr. Evelina Bucy her off it    Scopolamine Other (See Comments)   Dizziness, also has vertigo already   Tape Rash   Plastic tape causes rash   Tapentadol Rash      Medication List    STOP taking these medications   calcium acetate 667 MG capsule Commonly known as: PHOSLO   HYDROcodone-acetaminophen 5-325 MG tablet Commonly known as: NORCO/VICODIN   insulin aspart 100 UNIT/ML injection Commonly known as: novoLOG   Levemir 100 UNIT/ML injection Generic drug: insulin detemir   meclizine 25 MG tablet Commonly  known as: ANTIVERT     TAKE these medications   albuterol (2.5 MG/3ML) 0.083% nebulizer solution Commonly known as: PROVENTIL Take 2.5 mg by nebulization every 4 (four) hours as needed for wheezing or shortness of breath.   albuterol 108 (90 Base) MCG/ACT inhaler Commonly known as: VENTOLIN HFA Inhale 2 puffs into the lungs every 4 (four) hours as needed for wheezing or shortness of breath.   alum & mag hydroxide-simeth 200-200-20 MG/5ML suspension Commonly known as: MAALOX/MYLANTA Take 15 mLs by mouth every 6 (six) hours as needed for indigestion or heartburn.   amLODipine 10 MG tablet Commonly known as: NORVASC Take 1 tablet (10 mg total) by mouth daily.   ammonium lactate 12 % lotion Commonly known as: LAC-HYDRIN Apply 1 application topically 2 (two) times daily as needed for dry skin.   aspirin EC 81 MG tablet Take 81 mg by mouth daily.   atorvastatin 20 MG tablet Commonly known as: LIPITOR Take 20 mg by mouth every evening.   calcium carbonate 500 MG chewable tablet Commonly known as: TUMS - dosed in mg elemental calcium Chew 2.5 tablets (500 mg of elemental calcium total) by mouth every 6 (six) hours as needed for indigestion.   carvedilol 6.25 MG tablet Commonly known as: COREG Take 6.25 mg by mouth 2 (  two) times daily with a meal.   cholestyramine light 4 g packet Commonly known as: PREVALITE Take 4 g by mouth 2 (two) times daily.   Creon 12000-38000 units Cpep capsule Generic drug: lipase/protease/amylase Take 12,000-36,000 Units by mouth See admin instructions. Take 3 capsules (36,000u) by mouth three times daily with meals and take 1 capsule (12,000u) by mouth daily with a snack   cyanocobalamin 1000 MCG tablet Take 1,000 mcg by mouth daily.   diphenhydrAMINE 25 MG tablet Commonly known as: BENADRYL Take 25 mg by mouth every 6 (six) hours as needed for itching.   epoetin alfa 10000 UNIT/ML injection Commonly known as: EPOGEN Inject 1 mL (10,000 Units  total) into the vein every Monday, Wednesday, and Friday with hemodialysis. Start taking on: June 27, 2020   feeding supplement (ENSURE ENLIVE) Liqd Take 237 mLs by mouth daily. Start taking on: June 26, 2020   furosemide 80 MG tablet Commonly known as: LASIX Take 80 mg by mouth daily.   gabapentin 100 MG capsule Commonly known as: NEURONTIN Take 100 mg by mouth every evening.   hydrALAZINE 50 MG tablet Commonly known as: APRESOLINE Take 1 tablet (50 mg total) by mouth every 8 (eight) hours.   hydrOXYzine 25 MG tablet Commonly known as: ATARAX/VISTARIL Take 25 mg by mouth daily.   Iron 325 (65 Fe) MG Tabs Take 325 mg by mouth daily.   multivitamin Tabs tablet Take 1 tablet by mouth daily.   mupirocin ointment 2 % Commonly known as: BACTROBAN Apply 1 application topically daily as needed (leg rash).   nitroGLYCERIN 0.4 MG SL tablet Commonly known as: NITROSTAT Place 0.4 mg under the tongue every 5 (five) minutes as needed for chest pain.   Omega-3 300 MG Caps Take 300 mg by mouth 2 (two) times daily. Every other day opposite omega XL   pantoprazole 40 MG tablet Commonly known as: PROTONIX Take 40 mg by mouth daily.   polyethylene glycol 17 g packet Commonly known as: MIRALAX / GLYCOLAX Take 17 g by mouth daily.   PROBIOTIC PO Take 1 capsule by mouth daily.   promethazine 25 MG tablet Commonly known as: PHENERGAN Take 1 tablet (25 mg total) by mouth every 6 (six) hours as needed for nausea or vomiting.   sevelamer carbonate 800 MG tablet Commonly known as: RENVELA Take 800 mg by mouth 4 (four) times daily. (at meals and with a snack)   traMADol 50 MG tablet Commonly known as: ULTRAM Take 1 tablet (50 mg total) by mouth every 8 (eight) hours as needed for moderate pain or severe pain.   vancomycin 750 MG/150ML Soln Commonly known as: VANCOREADY Inject 150 mLs (750 mg total) into the vein every Monday, Wednesday, and Friday with hemodialysis for 17  days. Start taking on: July 02, 2020   VITA-C PO Take 1 tablet by mouth daily.   Vitamin D3 25 MCG (1000 UT) Caps Take 1,000 Units by mouth daily.        Follow-up Information    [facility] Follow up on 07/05/2020.   Specialty: Cardiology Why: at 11:00am. Enter through the [facility] entrance Contact information: [address] [place] [phone]       Delana Meyer, Dolores Lory, MD Follow up in 2 week(s).   Specialties: Vascular Surgery, Cardiology, Radiology, Vascular Surgery Why: To see [facility]. No studies. Discuss new access.  Contact information: [place] [address] [phone]  [facility] [place]. Schedule an appointment as soon as possible for a visit in 1 week(s).   Specialty: General Practice Contact information: [place] [address] [phone]        Corey Skains, MD .   Specialty: Cardiology Contact information: [facility] [place] [address] [phone]               Allergies  Allergen Reactions  . Ace Inhibitors Swelling and Anaphylaxis  . Ativan [Lorazepam] Other (See Comments)    Reaction:Hallucinations and headaches  . Compazine [Prochlorperazine Edisylate] Anaphylaxis, Nausea And Vomiting and Other (See Comments)    Other reaction(s): dystonia from this vs. Reglan, 23 Jul - patient relates that she takes promethazine frequently with no problems  . Sumatriptan Succinate Other (See Comments)    Other reaction(s): delirium and hallucinations per [facility] records  . Zofran [Ondansetron] Nausea And Vomiting    Per pt. she is allergic to zofran or will experience adverse reaction like hallucination   . Losartan Nausea Only  . Prochlorperazine Other (See Comments)    Reaction:  Unknown . Patient does not remember reaction but she  does have vertigo and anxiety along with n and v at times. Could be used to treat any of these   . Reglan [Metoclopramide] Other (See Comments)    Per patient her Dr. Evelina Bucy her off it   . Scopolamine Other (See Comments)    Dizziness, also has vertigo already  . Tape Rash    Plastic tape causes rash  . Tapentadol Rash     The results of significant diagnostics from this hospitalization (including imaging, microbiology, ancillary and laboratory) are listed below for reference.   Consultations:   Procedures/Studies: CT ABDOMEN PELVIS WO CONTRAST  Result Date: 06/16/2020 CLINICAL DATA:  Severe abdominal pain. Suspicion for retroperitoneal hemorrhage. EXAM: CT ABDOMEN AND PELVIS WITHOUT CONTRAST TECHNIQUE: Multidetector CT imaging of the abdomen and pelvis was performed following the standard protocol without IV contrast. COMPARISON:  04/06/2020 FINDINGS: Lower chest: New small bilateral pleural effusions and bibasilar infiltrates noted. Hepatobiliary:  No mass visualized on this unenhanced exam. Pancreas: No mass or inflammatory process visualized on this unenhanced exam. Spleen:  Within normal limits in size. Adrenals/Urinary tract: Diffuse bilateral renal parenchymal atrophy, consistent with end-stage renal disease. No evidence of urolithiasis or hydronephrosis. Unremarkable unopacified urinary bladder. Stomach/Bowel: No evidence of obstruction, inflammatory process, or abnormal fluid collections. Colonic diverticulosis is again seen, without evidence of diverticulitis. Vascular/Lymphatic: No pathologically enlarged lymph nodes identified. No evidence of abdominal aortic aneurysm. Aortic atherosclerosis noted. Reproductive: Prior hysterectomy noted. Adnexal regions are unremarkable in appearance. Other: New moderate diffuse body wall edema and mild mesenteric edema. No evidence of retroperitoneal hemorrhage or ascites. Musculoskeletal:  No suspicious bone lesions identified. IMPRESSION: New small  bilateral pleural effusions, diffuse body wall edema, and mild mesenteric edema, consistent with 3rd spacing. New bibasilar atelectasis versus infiltrates. No evidence of retroperitoneal hemorrhage. Colonic diverticulosis, without radiographic evidence of diverticulitis. Aortic Atherosclerosis (ICD10-I70.0). Electronically Signed   By: Marlaine Hind M.D.   On: 06/16/2020 12:05   DG Chest 1 View  Result Date: 06/05/2020 CLINICAL DATA:  Respiratory distress, pulmonary embolism EXAM: CHEST  1 VIEW COMPARISON:  06/02/2020 FINDINGS: Lung volumes are small and there is bibasilar atelectasis. Superimposed interstitial pulmonary edema has resolved. Tiny bilateral pleural effusions have improved in the interval. No pneumothorax. Cardiac size within normal limits. Right internal jugular he row graft is  seen with its tip overlying the right atrium. Vascular stents are seen within the central left upper extremity veins and visualized right upper extremity. Cardiac size within normal limits. IMPRESSION: Small lung volumes with bibasilar atelectasis. Superimposed interstitial pulmonary edema has resolved. Tiny bilateral pleural effusions have improved in the interval. Electronically Signed   By: Fidela Salisbury MD   On: 06/05/2020 16:54   CT HEAD WO CONTRAST  Result Date: 06/11/2020 CLINICAL DATA:  Hypotension during PE.  History of dialysis EXAM: CT HEAD WITHOUT CONTRAST TECHNIQUE: Contiguous axial images were obtained from the base of the skull through the vertex without intravenous contrast. COMPARISON:  Six days ago FINDINGS: Brain: No evidence of acute infarction, hemorrhage, hydrocephalus, extra-axial collection or mass lesion/mass effect. Vascular: Atherosclerotic calcification Skull: Generalized sclerotic appearance of the bones, likely renal. Sinuses/Orbits: Bilateral cataract resection.  No acute finding IMPRESSION: No acute finding. Electronically Signed   By: Monte Fantasia M.D.   On: 06/11/2020 10:36   CT  HEAD WO CONTRAST  Result Date: 06/05/2020 CLINICAL DATA:  59 year old female with hypotension, diaphoresis, altered mental status today. In stage renal disease, dialysis. EXAM: CT HEAD WITHOUT CONTRAST TECHNIQUE: Contiguous axial images were obtained from the base of the skull through the vertex without intravenous contrast. COMPARISON:  Head CT 05/02/2020.  Brain MRI 05/03/2019. FINDINGS: Brain: No midline shift, ventriculomegaly, mass effect, evidence of mass lesion, intracranial hemorrhage or evidence of cortically based acute infarction. Chronic lower pontine lacunar infarcts better demonstrated on MRI last year. Elsewhere gray-white matter differentiation is within normal limits throughout the brain. Vascular: Calcified atherosclerosis at the skull base. No suspicious intracranial vascular hyperdensity. Skull: Stable visualized osseous structures. Scattered small lucent areas in the calvarium (series 3, image 62 at the left vertex) have mildly increased since 2017 and are probably related to metabolic bone disease. Sinuses/Orbits: Visualized paranasal sinuses and mastoids are stable and well pneumatized. Other: No acute orbit or scalp soft tissue finding. Some scalp vessel calcified atherosclerosis is noted. IMPRESSION: 1. No acute intracranial abnormality. 2. Chronic lacunar infarcts in the pons. 3. Several slowly enlarging small lucent areas in the calvarium since 2017 are probably related to metabolic bone disease in the setting of chronic ESRD. Electronically Signed   By: Genevie Ann M.D.   On: 06/05/2020 16:28   CT CHEST WO CONTRAST  Result Date: 06/11/2020 CLINICAL DATA:  Hypotensive following transesophageal echocardiogram. EXAM: CT CHEST WITHOUT CONTRAST TECHNIQUE: Multidetector CT imaging of the chest was performed following the standard protocol without IV contrast. COMPARISON:  CT scan 05/22/2020 FINDINGS: Cardiovascular: The heart is mildly enlarged but stable. Stable tortuosity and calcification  of the thoracic aorta. Stable three-vessel coronary artery calcifications. Right IJ Vas-Cath in good position without complicating features. Left brachiocephalic vein stent is stable. Mediastinum/Nodes: Stable scattered mediastinal and hilar lymph nodes. The esophagus is grossly normal. Lungs/Pleura: Persistent vascular congestion without overt pulmonary edema. There are small bilateral pleural effusions and lower lobe interstitial changes which have improved. Mild lower lobe peribronchial thickening. No infiltrates or pneumothorax. Upper Abdomen: No significant upper abdominal findings. Stable advanced vascular calcifications. Bilateral renal atrophy. Musculoskeletal: No significant bony findings. IMPRESSION: 1. Persistent vascular congestion without overt pulmonary edema. 2. Small bilateral pleural effusions and lower lobe interstitial changes which have improved. 3. No infiltrates or pneumothorax. 4. Stable advanced atherosclerotic calcifications involving the thoracic and abdominal aorta and branch vessels including the coronary arteries. 5. Aortic atherosclerosis. Aortic Atherosclerosis (ICD10-I70.0). Electronically Signed   By: Marijo Sanes M.D.   On:  06/11/2020 10:46   MR BRAIN WO CONTRAST  Result Date: 06/13/2020 CLINICAL DATA:  Follow-up examination for stroke. EXAM: MRI HEAD WITHOUT CONTRAST TECHNIQUE: Multiplanar, multiecho pulse sequences of the brain and surrounding structures were obtained without intravenous contrast. COMPARISON:  Prior head CT from 06/11/2020. FINDINGS: Brain: Cerebral volume within normal limits for age. Mild hazy T2/FLAIR hyperintensity noted within the periventricular white matter, nonspecific, but most like related chronic microvascular ischemic disease, felt to be within normal limits for age. Few small remote lacunar infarcts noted within the pons. No abnormal foci of restricted diffusion to suggest acute or subacute ischemia. Gray-white matter differentiation maintained.  No encephalomalacia to suggest chronic cortical infarction. Single punctate chronic microhemorrhage noted within the left cerebellum, of doubtful significance in isolation. No other foci of susceptibility artifact to suggest acute or chronic intracranial hemorrhage. No mass lesion, midline shift or mass effect. No hydrocephalus or extra-axial fluid collection. Incidental note made of a partially empty sella. Midline structures intact. Vascular: Major intracranial vascular flow voids are maintained. Skull and upper cervical spine: Craniocervical junction within normal limits. Diffusely decreased T1 signal intensity seen within the visualized bone marrow, nonspecific, but most commonly related to anemia, smoking, or obesity. No focal marrow replacing lesion. No scalp soft tissue abnormality. Sinuses/Orbits: Patient status post bilateral ocular lens replacement. Globes and orbital soft tissues demonstrate no acute finding. Mild mucosal thickening noted within the left ethmoidal air cells. Paranasal sinuses are otherwise largely clear. Fluid noted layering within the nasopharynx. Patient is intubated. No significant mastoid effusion. Other: None. IMPRESSION: 1. No acute intracranial abnormality. 2. Few small remote lacunar infarcts involving the pons. 3. Otherwise normal brain MRI for age. Electronically Signed   By: Jeannine Boga M.D.   On: 06/13/2020 02:08   NM Pulmonary Perfusion  Result Date: 06/05/2020 CLINICAL DATA:  Left chest pain EXAM: NUCLEAR MEDICINE PERFUSION LUNG SCAN TECHNIQUE: Perfusion images were obtained in multiple projections after intravenous injection of radiopharmaceutical. Ventilation scans intentionally deferred if perfusion scan and chest x-ray adequate for interpretation during COVID 19 epidemic. RADIOPHARMACEUTICALS:  4.724 mCi Tc-26m MAA IV COMPARISON:  None. Findings are correlated with concurrently performed chest radiograph FINDINGS: There is no significant perfusion defect  identified. IMPRESSION: Negative for pulmonary embolism Electronically Signed   By: Fidela Salisbury MD   On: 06/05/2020 16:56   PERIPHERAL VASCULAR CATHETERIZATION  Result Date: 06/19/2020 See op note  [place] UPPER EXTREMITY ARTERIAL RIGHT LIMITED (GRAFT, SINGLE VESSEL)  Result Date: 06/09/2020 CLINICAL DATA:  59 year old female with new swelling at the dialysis access. EXAM: RIGHT UPPER EXTREMITY ARTERIAL DUPLEX SCAN TECHNIQUE: Gray-scale sonography as well as color Doppler and duplex ultrasound was performed to evaluate a limited evaluation of the upper extremity. COMPARISON:  None. FINDINGS: Duplex performed in the region clinical concern. Flow confirmed within the dialysis circuit. No volume measurement or velocities were measured. Heterogeneous fluid present adjacent to the graft within the soft tissues. No internal color flow to suggest a patent pseudoaneurysm. Estimated dimension of the fluid 3.3 cm x 2.5 cm x 3.5 cm. IMPRESSION: Heterogeneous fluid collection adjacent to patent right upper extremity dialysis graft, with the differential including blood products/hematoma versus abscess given the history. Electronically Signed   By: Corrie Mckusick D.O.   On: 06/09/2020 10:23   DG Chest Port 1 View  Result Date: 06/14/2020 CLINICAL DATA:  Hypoxia. EXAM: PORTABLE CHEST 1 VIEW COMPARISON:  June 12, 2020 FINDINGS: Endotracheal tube tip is 5.2 cm above the carina. No pneumothorax. There is atelectatic change  in the left base. There is no edema or airspace opacity. Heart is borderline enlarged with pulmonary vascularity normal. No adenopathy. There is aortic atherosclerosis. There is a left subclavian and axillary stent. IMPRESSION: Endotracheal tube as described without pneumothorax. Left base atelectasis. Lungs elsewhere clear. Stable cardiac prominence. Aortic Atherosclerosis (ICD10-I70.0). Electronically Signed   By: Lowella Grip III M.D.   On: 06/14/2020 08:06   DG Chest Port 1 View  Result  Date: 06/12/2020 CLINICAL DATA:  Acute respiratory failure EXAM: PORTABLE CHEST 1 VIEW COMPARISON:  None. FINDINGS: Endotracheal tube is been placed with its tip 5.1 cm above the carina. Right internal jugular HeRO graft venous limb is seen with its tip within the right atrium. Left upper extremity central venous stenting has been performed. Minimal left basilar atelectasis. Lungs are otherwise clear. No pneumothorax or pleural effusion. Cardiac size within normal limits. Pulmonary vascularity is normal. IMPRESSION: No active disease. Electronically Signed   By: Fidela Salisbury MD   On: 06/12/2020 05:18   DG Chest Port 1 View  Result Date: 06/07/2020 CLINICAL DATA:  RIGHT-sided chest pain and rib pain for 5 days EXAM: PORTABLE CHEST 1 VIEW COMPARISON:  Chest x-ray 06/05/2020 FINDINGS: RIGHT-sided dialysis catheter terminates in the upper RIGHT atrium, no change from the previous study. Stenting of LEFT axillary and subclavian venous system also unchanged. Heart size remains enlarged. Lung volumes mild bibasilar airspace disease. No acute skeletal process on limited assessment. IMPRESSION: 1. Low lung volumes with mild bibasilar airspace disease similar to prior. 2. Mild interstitial prominence may reflect mild pulmonary edema though is similar to the previous evaluation. Findings remain improved when compared to 06/02/2020. 3. No acute findings in the chest. 4. Stable cardiomegaly. Electronically Signed   By: Zetta Bills M.D.   On: 06/07/2020 11:17   DG Chest Portable 1 View  Result Date: 06/02/2020 CLINICAL DATA:  Shortness of breath EXAM: PORTABLE CHEST 1 VIEW COMPARISON:  CT 05/22/2020, radiograph 05/22/2020 FINDINGS: Bilateral pleural thickening and small basilar effusions. Mixed interstitial and patchy opacities towards the mid to lower lungs likely reflecting a combination of interstitial and developing alveolar edema with central cuffing, vascular cephalization and congestion. Cardiomegaly is  similar to priors. Evidence of prior bilateral vascular stenting in the left brachiocephalic/subclavian vein and right upper arm. A right upper extremity HeRO graft is also noted, terminating at the level the right atrium. Removal of a right lower extremity central venous catheter. Telemetry leads overlie the chest. Mild body wall edema. No other acute or suspicious soft tissue or osseous abnormality. Degenerative changes are present in the imaged spine and shoulders. IMPRESSION: Features of CHF/volume overload with cardiomegaly, edema and small bilateral effusions. More patchy opacities could reflect alveolar edema versus underlying infection. Electronically Signed   By: Lovena Le M.D.   On: 06/02/2020 18:37   ECHOCARDIOGRAM COMPLETE  Result Date: 06/07/2020    ECHOCARDIOGRAM REPORT   Patient Name:   RITIKA HELLICKSON Date of Exam: 06/07/2020 Medical Rec #:  [id number]   Height:       65.0 in Accession #:    [id number]  Weight:       197.5 lb Date of Birth:  1961-04-17    BSA:          1.968 m Patient Age:    38 years    BP:           133/52 mmHg Patient Gender: F           HR:  63 bpm. Exam Location:  [facility] Procedure: 2D Echo, Cardiac Doppler and Color Doppler Indications:     Bacteremia 790.7  History:         Patient has prior history of Echocardiogram examinations, most                  recent 02/24/2020. Previous Myocardial Infarction, COPD; Risk                  Factors:Diabetes. Mitral Regurgitation.  Sonographer:     Sherrie Sport RDCS (AE) Referring Phys:  [id number] Tsosie Billing Diagnosing Phys: Bartholome Bill MD  Sonographer Comments: Suboptimal apical window. IMPRESSIONS  1. Left ventricular ejection fraction, by estimation, is 60 to 65%. The left ventricle has normal function. The left ventricle has no regional wall motion abnormalities. There is mild left ventricular hypertrophy. Left ventricular diastolic parameters are consistent with Grade I diastolic dysfunction (impaired relaxation).  2.  Right ventricular systolic function was not well visualized. The right ventricular size is mildly enlarged.  3. Right atrial size was mildly dilated.  4. The mitral valve is grossly normal. Trivial mitral valve regurgitation.  5. Tricuspid valve regurgitation is moderate.  6. The aortic valve is tricuspid. Aortic valve regurgitation is not visualized. FINDINGS  Left Ventricle: Left ventricular ejection fraction, by estimation, is 60 to 65%. The left ventricle has normal function. The left ventricle has no regional wall motion abnormalities. The left ventricular internal cavity size was normal in size. There is  mild left ventricular hypertrophy. Left ventricular diastolic parameters are consistent with Grade I diastolic dysfunction (impaired relaxation). Right Ventricle: The right ventricular size is mildly enlarged. Right vetricular wall thickness was not well visualized. Right ventricular systolic function was not well visualized. Left Atrium: Left atrial size was normal in size. Right Atrium: Right atrial size was mildly dilated. Pericardium: There is no evidence of pericardial effusion. Mitral Valve: The mitral valve is grossly normal. Trivial mitral valve regurgitation. Tricuspid Valve: The tricuspid valve is grossly normal. Tricuspid valve regurgitation is moderate. Aortic Valve: The aortic valve is tricuspid. Aortic valve regurgitation is not visualized. Aortic valve mean gradient measures 5.0 mmHg. Aortic valve peak gradient measures 8.0 mmHg. Aortic valve area, by VTI measures 1.68 cm. Pulmonic Valve: The pulmonic valve was not well visualized. Pulmonic valve regurgitation is trivial. Aorta: The aortic root was not well visualized. IAS/Shunts: The interatrial septum was not assessed.  LEFT VENTRICLE PLAX 2D LVIDd:         4.60 cm  Diastology LVIDs:         2.89 cm  LV e' medial:    6.20 cm/s LV PW:         0.95 cm  LV E/e' medial:  17.3 LV IVS:        0.98 cm  LV e' lateral:   9.03 cm/s LVOT diam:     2.00  cm  LV E/e' lateral: 11.8 LV SV:         52 LV SV Index:   27 LVOT Area:     3.14 cm  RIGHT VENTRICLE RV Basal diam:  5.30 cm RV S prime:     12.00 cm/s TAPSE (M-mode): 3.5 cm LEFT ATRIUM             Index       RIGHT ATRIUM           Index LA diam:        3.70 cm 1.88 cm/m  RA Area:  29.00 cm LA Vol (A2C):   75.4 ml 38.32 ml/m RA Volume:   120.00 ml 60.98 ml/m LA Vol (A4C):   34.6 ml 17.58 ml/m LA Biplane Vol: 53.2 ml 27.04 ml/m  AORTIC VALVE                    PULMONIC VALVE AV Area (Vmax):    1.86 cm     PV Vmax:        0.59 m/s AV Area (Vmean):   1.65 cm     PV Peak grad:   1.4 mmHg AV Area (VTI):     1.68 cm     RVOT Peak grad: 2 mmHg AV Vmax:           141.00 cm/s AV Vmean:          103.350 cm/s AV VTI:            0.310 m AV Peak Grad:      8.0 mmHg AV Mean Grad:      5.0 mmHg LVOT Vmax:         83.60 cm/s LVOT Vmean:        54.200 cm/s LVOT VTI:          0.166 m LVOT/AV VTI ratio: 0.54  AORTA Ao Root diam: 2.40 cm MITRAL VALVE                TRICUSPID VALVE MV Area (PHT): 3.60 cm     TR Peak grad:   60.2 mmHg MV Decel Time: 211 msec     TR Vmax:        388.00 cm/s MV E velocity: 107.00 cm/s MV A velocity: 104.00 cm/s  SHUNTS MV E/A ratio:  1.03         Systemic VTI:  0.17 m                             Systemic Diam: 2.00 cm Bartholome Bill MD Electronically signed by Bartholome Bill MD Signature Date/Time: 06/07/2020/10:42:33 AM    Final    ECHO TEE  Result Date: 06/11/2020    TRANSESOPHOGEAL ECHO REPORT   Patient Name:   REAGAN BEHLKE Date of Exam: 06/11/2020 Medical Rec #:  [id number]   Height:       65.0 in Accession #:    [id number]  Weight:       190.5 lb Date of Birth:  02/11/1961    BSA:          1.938 m Patient Age:    14 years    BP:           147/52 mmHg Patient Gender: F           HR:           56 bpm. Exam Location:  [facility] Procedure: Transesophageal Echo, Cardiac Doppler and Color Doppler Indications:     Bacteremia 790.7  History:         Patient has prior history of Echocardiogram  examinations, most                  recent 06/07/2020. COPD, Signs/Symptoms:Dyspnea; Risk                  Factors:Hypertension.  Sonographer:     Sherrie Sport RDCS (AE) Referring Phys:  [id number] Teodoro Spray Diagnosing Phys: Bartholome Bill MD PROCEDURE: The transesophogeal probe was passed without difficulty through the  esophogus of the patient. Sedation performed by different physician. The patient was monitored while under deep sedation. Image quality was excellent. The patient developed Respiratory depression and hypotension and bradycardia during the procedure. IMPRESSIONS  1. Left ventricular ejection fraction, by estimation, is 50 to 55%. The left ventricle has normal function.  2. Right ventricular systolic function is normal. The right ventricular size is normal.  3. Left atrial size was mildly dilated. No left atrial/left atrial appendage thrombus was detected.  4. Right atrial size was mildly dilated.  5. The mitral valve is grossly normal. Mild to moderate mitral valve regurgitation.  6. Tricuspid valve regurgitation is mild to moderate.  7. The aortic valve is tricuspid. Aortic valve regurgitation is not visualized. FINDINGS  Left Ventricle: Left ventricular ejection fraction, by estimation, is 50 to 55%. The left ventricle has normal function. The left ventricular internal cavity size was normal in size. Right Ventricle: The right ventricular size is normal. No increase in right ventricular wall thickness. Right ventricular systolic function is normal. Left Atrium: Left atrial size was mildly dilated. No left atrial/left atrial appendage thrombus was detected. Right Atrium: Right atrial size was mildly dilated. Pericardium: There is no evidence of pericardial effusion. Mitral Valve: The mitral valve is grossly normal. Mild to moderate mitral valve regurgitation. There is no evidence of mitral valve vegetation. Tricuspid Valve: The tricuspid valve is grossly normal. Tricuspid valve regurgitation is mild to  moderate. There is no evidence of tricuspid valve vegetation. Aortic Valve: The aortic valve is tricuspid. Aortic valve regurgitation is not visualized. There is no evidence of aortic valve vegetation. Pulmonic Valve: The pulmonic valve was not well visualized. Pulmonic valve regurgitation is not visualized. Aorta: The aortic root is normal in size and structure. IAS/Shunts: The interatrial septum was not assessed. Bartholome Bill MD Electronically signed by Bartholome Bill MD Signature Date/Time: 06/11/2020/12:41:00 PM    Final       Labs: BNP (last 3 results) Recent Labs    05/22/20 0840  BNP 546.5*   Basic Metabolic Panel: Recent Labs  Lab 06/18/20 1726 06/18/20 1726 06/19/20 0707 06/19/20 0707 06/20/20 0634 06/21/20 0644 06/22/20 0547 06/23/20 1053 06/25/20 0641  NA 134*  135   < > 135  --  135 138 135  --  132*  K 3.9  4.0   < > 3.9  --  4.1 4.0 4.0  --  3.9  CL 96*  96*   < > 97*  --  99 100 100  --  96*  CO2 26  27   < > 25  --  25 27 25   --  24  GLUCOSE 92  95   < > 63*  --  83 86 60*  --  76  BUN 22  23   < > 16  --  22 12 15   --  14  CREATININE 4.77*  4.83*   < > 3.92*  --  4.99* 3.49* 4.44*  --  4.82*  CALCIUM 8.4*  8.5*   < > 8.4*  --  8.1* 8.2* 8.4*  --  8.5*  MG 1.9   < > 2.1   < > 2.0 1.9 2.1 1.9 2.0  PHOS 3.5  --  3.1  --   --   --   --   --   --    < > = values in this interval not displayed.   Liver Function Tests: Recent Labs  Lab 06/18/20 1726 06/19/20 0707  ALBUMIN 2.3* 2.3*   No results for input(s): LIPASE, AMYLASE in the last 168 hours. No results for input(s): AMMONIA in the last 168 hours. CBC: Recent Labs  Lab 06/20/20 0634 06/21/20 0644 06/22/20 0547 06/23/20 1053 06/25/20 0641  WBC 4.4 3.7* 3.9* 5.4 5.1  HGB 8.2* 7.7* 7.6* 8.7* 7.9*  HCT 24.4* 22.3* 23.3* 26.4* 24.3*  MCV 85.9 85.4 88.6 87.7 90.0  PLT 183 154 172 224 218   Cardiac Enzymes: No results for input(s): CKTOTAL, CKMB, CKMBINDEX, TROPONINI in the last 168  hours. BNP: Invalid input(s): POCBNP CBG: Recent Labs  Lab 06/23/20 1654 06/23/20 2028 06/24/20 0735 06/25/20 0547 06/25/20 0626  GLUCAP 66* 77 91 58* 79   D-Dimer No results for input(s): DDIMER in the last 72 hours. Hgb A1c No results for input(s): HGBA1C in the last 72 hours. Lipid Profile No results for input(s): CHOL, HDL, LDLCALC, TRIG, CHOLHDL, LDLDIRECT in the last 72 hours. Thyroid function studies No results for input(s): TSH, T4TOTAL, T3FREE, THYROIDAB in the last 72 hours.  Invalid input(s): FREET3 Anemia work up No results for input(s): VITAMINB12, FOLATE, FERRITIN, TIBC, IRON, RETICCTPCT in the last 72 hours. Urinalysis    Component Value Date/Time   COLORURINE YELLOW (A) 06/09/2017 1039   APPEARANCEUR CLEAR (A) 06/09/2017 1039   APPEARANCEUR Clear 09/15/2014 0947   LABSPEC 1.014 06/09/2017 1039   LABSPEC 1.008 09/15/2014 0947   PHURINE 8.0 06/09/2017 1039   GLUCOSEU 150 (A) 06/09/2017 1039   GLUCOSEU 150 mg/dL 09/15/2014 0947   HGBUR SMALL (A) 06/09/2017 1039   BILIRUBINUR NEGATIVE 06/09/2017 1039   BILIRUBINUR Negative 09/15/2014 0947   KETONESUR NEGATIVE 06/09/2017 1039   PROTEINUR >=300 (A) 06/09/2017 1039   UROBILINOGEN 0.2 06/30/2010 2130   NITRITE NEGATIVE 06/09/2017 1039   LEUKOCYTESUR NEGATIVE 06/09/2017 1039   LEUKOCYTESUR Negative 09/15/2014 0947   Sepsis Labs Invalid input(s): PROCALCITONIN,  WBC,  LACTICIDVEN Microbiology No results found for this or any previous visit (from the past 240 hour(s)).   Total time spend on discharging this patient, including the last patient exam, discussing the hospital stay, instructions for ongoing care as it relates to all pertinent caregivers, as well as preparing the medical discharge records, prescriptions, and/or referrals as applicable, is 35 minutes.    Enzo Bi, MD  [facility] 06/25/2020, 12:30 PM  If 7PM-7AM, please contact night-coverage

## 2020-06-25 NOTE — Progress Notes (Addendum)
Pharmacy Antibiotic Note [name] is a 59 y.o. female admitted on 06/02/2020 with respiratory failure secondary to acute diastolic CHF and COPD exacerbation. PMH includes ESRD on HD, HTN, DM, CAD, PCD.  Pharmacy has been consulted for vancomycin dosing.  Patient with ESRD on HD with numerous graft complications. Erythemous firm swelling in right cubital area of graft arm.  Fistula site noted as possible source of bloodstream infection, removed 9/22. Perm cath placed 5/18 without complications. Pharmacy is consulted for vancomycin dosing (from ampicillin).    TTE 9/16: moderate tricuspid valve regurgitation, no vegetation visualized  TEE 9/20: mild to moderate tricuspid valve regurgitation, no vegetation visualized. Procedure complicated by hypotension, respiratory depression, and bradycardia.    Patient receiving MWF dialysis. Most recent dialysis session 9/27 2 hrs with BFR 150, 9/25 session 3 hrs with BFR 250, 400 ordered.   Cultures this admission:  BCx 9/13: Enterococcus faecalis, amp sensitive, gent synergy sensitive BCx 9/14: Enterococcus faecalis  BCID 9/14: Enterococcus faecalis detected  BCx 9/16: NGTD in 4/4 bottles  Plan:   Continue vancomycin 750 mg IV qHD MWF  following dialysis.  Monitor vancomycin level prior to HD 10/8 dose(If remains inpatient) to determine if dose is needed and if dose adjustments are warranted   Monitor s/sx of infection resolution (I.e temp, WBC) throughout therapy  Height: 5\' 5"  (165.1 cm) Weight: 82.5 kg (181 lb 14.4 oz) IBW/kg (Calculated) : 57  Temp (24hrs), Avg:97.8 F (36.6 C), Min:97.6 F (36.4 C), Max:98.1 F (36.7 C)  Recent Labs  Lab 06/18/20 1726 06/19/20 0707 06/20/20 0634 06/21/20 0644 06/22/20 0547 06/23/20 1053 06/25/20 0641  WBC   < >  --  4.4 3.7* 3.9* 5.4 5.1  CREATININE  --  3.92* 4.99* 3.49* 4.44*  --  4.82*   < > = values in this interval not displayed.    Estimated Creatinine Clearance: 13 mL/min (A) (by  C-G formula based on SCr of 4.82 mg/dL (H)).    Allergies  Allergen Reactions  . Ace Inhibitors Swelling and Anaphylaxis  . Ativan [Lorazepam] Other (See Comments)    Reaction:Hallucinations and headaches  . Compazine [Prochlorperazine Edisylate] Anaphylaxis, Nausea And Vomiting and Other (See Comments)    Other reaction(s): dystonia from this vs. Reglan, 23 Jul - patient relates that she takes promethazine frequently with no problems  . Sumatriptan Succinate Other (See Comments)    Other reaction(s): delirium and hallucinations per [facility] records  . Zofran [Ondansetron] Nausea And Vomiting    Per pt. she is allergic to zofran or will experience adverse reaction like hallucination   . Losartan Nausea Only  . Prochlorperazine Other (See Comments)    Reaction:  Unknown . Patient does not remember reaction but she does have vertigo and anxiety along with n and v at times. Could be used to treat any of these   . Reglan [Metoclopramide] Other (See Comments)    Per patient her Dr. Evelina Bucy her off it   . Scopolamine Other (See Comments)    Dizziness, also has vertigo already  . Tape Rash    Plastic tape causes rash  . Tapentadol Rash    Antimicrobials this admission: azithromycin 9/12>>9/15 ceftriaxone 9/12>>9/15 Zosyn 9/15 >>9/16, 9/16 >> 9/17 gentamicin 9/17 >>9/24 ampicillin 9/17 >> Microbiology results: BCx 9/13: Enterococcus faecalis, amp sensitive, gent synergy sensitive BCx 9/14: Enterococcus faecalis  BCx 9/16: NGTD in 4/4 bottles 9/22 HD graft Rare E. faecalis  Thank you for allowing pharmacy to be a part of this patient's care.  Doreene Eland, PharmD, BCPS.   Work Cell: [phone] 06/25/2020 12:09 PM

## 2020-06-25 NOTE — Progress Notes (Signed)
[name] to be D/C'd [facility] per MD order.  Discussed prescriptions and follow up appointments with the patient. Prescriptions placed in packet for receiving facility.   Allergies as of 06/25/2020       Reactions   Ace Inhibitors Swelling, Anaphylaxis   Ativan [lorazepam] Other (See Comments)   Reaction:  Hallucinations and headaches   Compazine [prochlorperazine Edisylate] Anaphylaxis, Nausea And Vomiting, Other (See Comments)   Other reaction(s): dystonia from this vs. Reglan, 23 Jul - patient relates that she takes promethazine frequently with no problems   Sumatriptan Succinate Other (See Comments)   Other reaction(s): delirium and hallucinations per [facility] records   Zofran [ondansetron] Nausea And Vomiting   Per pt. she is allergic to zofran or will experience adverse reaction like hallucination    Losartan Nausea Only   Prochlorperazine Other (See Comments)   Reaction:  Unknown . Patient does not remember reaction but she does have vertigo and anxiety along with n and v at times. Could be used to treat any of these   Reglan [metoclopramide] Other (See Comments)   Per patient her Dr. Evelina Bucy her off it    Scopolamine Other (See Comments)   Dizziness, also has vertigo already   Tape Rash   Plastic tape causes rash   Tapentadol Rash        Medication List     STOP taking these medications    calcium acetate 667 MG capsule Commonly known as: PHOSLO   HYDROcodone-acetaminophen 5-325 MG tablet Commonly known as: NORCO/VICODIN   insulin aspart 100 UNIT/ML injection Commonly known as: novoLOG   Levemir 100 UNIT/ML injection Generic drug: insulin detemir   meclizine 25 MG tablet Commonly known as: ANTIVERT       TAKE these medications    albuterol (2.5 MG/3ML) 0.083% nebulizer solution Commonly known as: PROVENTIL Take 2.5 mg by nebulization every 4 (four) hours as needed for wheezing or shortness of breath.   albuterol 108 (90 Base) MCG/ACT  inhaler Commonly known as: VENTOLIN HFA Inhale 2 puffs into the lungs every 4 (four) hours as needed for wheezing or shortness of breath.   alum & mag hydroxide-simeth 200-200-20 MG/5ML suspension Commonly known as: MAALOX/MYLANTA Take 15 mLs by mouth every 6 (six) hours as needed for indigestion or heartburn.   amLODipine 10 MG tablet Commonly known as: NORVASC Take 1 tablet (10 mg total) by mouth daily.   ammonium lactate 12 % lotion Commonly known as: LAC-HYDRIN Apply 1 application topically 2 (two) times daily as needed for dry skin.   aspirin EC 81 MG tablet Take 81 mg by mouth daily.   atorvastatin 20 MG tablet Commonly known as: LIPITOR Take 20 mg by mouth every evening.   calcium carbonate 500 MG chewable tablet Commonly known as: TUMS - dosed in mg elemental calcium Chew 2.5 tablets (500 mg of elemental calcium total) by mouth every 6 (six) hours as needed for indigestion.   carvedilol 6.25 MG tablet Commonly known as: COREG Take 6.25 mg by mouth 2 (two) times daily with a meal.   cholestyramine light 4 g packet Commonly known as: PREVALITE Take 4 g by mouth 2 (two) times daily.   Creon 12000-38000 units Cpep capsule Generic drug: lipase/protease/amylase Take 12,000-36,000 Units by mouth See admin instructions. Take 3 capsules (36,000u) by mouth three times daily with meals and take 1 capsule (12,000u) by mouth daily with a snack   cyanocobalamin 1000 MCG tablet Take 1,000 mcg by mouth daily.  diphenhydrAMINE 25 MG tablet Commonly known as: BENADRYL Take 25 mg by mouth every 6 (six) hours as needed for itching.   epoetin alfa 10000 UNIT/ML injection Commonly known as: EPOGEN Inject 1 mL (10,000 Units total) into the vein every Monday, Wednesday, and Friday with hemodialysis. Start taking on: June 27, 2020   feeding supplement (ENSURE ENLIVE) Liqd Take 237 mLs by mouth daily. Start taking on: June 26, 2020   furosemide 80 MG tablet Commonly known  as: LASIX Take 80 mg by mouth daily.   gabapentin 100 MG capsule Commonly known as: NEURONTIN Take 100 mg by mouth every evening.   hydrALAZINE 50 MG tablet Commonly known as: APRESOLINE Take 1 tablet (50 mg total) by mouth every 8 (eight) hours.   hydrOXYzine 25 MG tablet Commonly known as: ATARAX/VISTARIL Take 25 mg by mouth daily.   Iron 325 (65 Fe) MG Tabs Take 325 mg by mouth daily.   multivitamin Tabs tablet Take 1 tablet by mouth daily.   mupirocin ointment 2 % Commonly known as: BACTROBAN Apply 1 application topically daily as needed (leg rash).   nitroGLYCERIN 0.4 MG SL tablet Commonly known as: NITROSTAT Place 0.4 mg under the tongue every 5 (five) minutes as needed for chest pain.   Omega-3 300 MG Caps Take 300 mg by mouth 2 (two) times daily. Every other day opposite omega XL   pantoprazole 40 MG tablet Commonly known as: PROTONIX Take 40 mg by mouth daily.   polyethylene glycol 17 g packet Commonly known as: MIRALAX / GLYCOLAX Take 17 g by mouth daily.   PROBIOTIC PO Take 1 capsule by mouth daily.   promethazine 25 MG tablet Commonly known as: PHENERGAN Take 1 tablet (25 mg total) by mouth every 6 (six) hours as needed for nausea or vomiting.   sevelamer carbonate 800 MG tablet Commonly known as: RENVELA Take 800 mg by mouth 4 (four) times daily. (at meals and with a snack)   traMADol 50 MG tablet Commonly known as: ULTRAM Take 1 tablet (50 mg total) by mouth every 8 (eight) hours as needed for moderate pain or severe pain.   vancomycin 750 MG/150ML Soln Commonly known as: VANCOREADY Inject 150 mLs (750 mg total) into the vein every Monday, Wednesday, and Friday with hemodialysis for 17 days. Start taking on: July 02, 2020   VITA-C PO Take 1 tablet by mouth daily.   Vitamin D3 25 MCG (1000 UT) Caps Take 1,000 Units by mouth daily.        Vitals:   06/25/20 1415 06/25/20 1620  BP: (!) 143/52 (!) 121/43  Pulse: 60 60  Resp: 12 18   Temp:  98 F (36.7 C)  SpO2: 100% 100%    Skin clean, and dry. Staples intake on right upper arm and wrapped with gauze.  no evidence of skin tears noted. IV catheter discontinued intact. Site without signs and symptoms of complications. Dressing and pressure applied. Pt denies pain at this time. No complaints noted.  An After Visit Summary was printed and given to the patient. Patient escorted via [place], and D/C home via private auto.  [place] A Annielee Jemmott

## 2020-06-25 NOTE — Progress Notes (Signed)
FSBS 58, 2 juices given, will recheck at [facility]

## 2020-06-25 NOTE — Progress Notes (Signed)
OT Cancellation Note  Patient Details Name: [name] MRN: [id number] DOB: 1961-07-29   Cancelled Treatment:    Reason Eval/Treat Not Completed: Patient at procedure or test/ unavailable. OT attempt but pt is currently at dialysis. OT will attempt at next available time.   Darleen Crocker, MS, OTR/L , CBIS ascom [phone]  06/25/20, 11:48 AM   06/25/2020, 11:47 AM

## 2020-06-25 NOTE — Progress Notes (Signed)
   Covid-19 Vaccination Clinic  Name:  POPPI SCANTLING    MRN: [id number] DOB: [birth year]  06/25/2020  Ms. Rossetti was observed post Covid-19 immunization for 15 minutes without incident. She was provided with Vaccine Information Sheet and instruction to access the V-Safe system.   Ms. Angelo was instructed to call 911 with any severe reactions post vaccine: Marland Kitchen Difficulty breathing  . Swelling of face and throat  . A fast heartbeat  . A bad rash all over body  . Dizziness and weakness   Immunizations Administered    Name Date Dose VIS Date Route   Pfizer COVID-19 Vaccine 06/25/2020  2:59 PM 0.3 mL 11/16/2018 Intramuscular   Manufacturer: [place]   Lot: [id number]   [place]: [id number]

## 2020-06-25 NOTE — Progress Notes (Signed)
[facility], [place] 06/25/20  Subjective:   LOS: 22 Patient found sitting at the bedside, complaints of nausea, no worsening  shortness of breath, waiting for dialysis today  Objective:  Vital signs in last 24 hours:  Temp:  [97.6 F (36.4 C)-98.1 F (36.7 C)] 97.6 F (36.4 C) (10/04 0551) Pulse Rate:  [53-62] 57 (10/04 0551) Resp:  [16-20] 20 (10/04 0551) BP: (158-190)/(68-89) 158/76 (10/04 0551) SpO2:  [100 %] 100 % (10/04 0551) Weight:  [82.5 kg] 82.5 kg (10/04 0500)  Weight change:  Filed Weights   06/19/20 1422 06/22/20 0500 06/25/20 0500  Weight: 81.4 kg 82.8 kg 82.5 kg    Intake/Output:   No intake or output data in the 24 hours ending 06/25/20 1049   Physical Exam: General:  In no acute distress  HEENT  normocephalic, atraumatic  Pulm/lungs  lungs bilaterally clear, respirations symmetric and unlabored  CVS/Heart  regular  Abdomen:   Soft, epigastric tenderness +  Extremities:  1+ peripheral edema   Neurologic:  Alert, awake, speech clear  Skin:  Staples intact on right upper arm, no signs of infection  Access:  Lt Chest Permcath       Basic Metabolic Panel:  Recent Labs  Lab 06/18/20 1726 06/18/20 1726 06/19/20 0707 06/19/20 0707 06/20/20 0634 06/20/20 0634 06/21/20 0644 06/22/20 0547 06/23/20 1053 06/25/20 0641  NA 134*  135   < > 135  --  135  --  138 135  --  132*  K 3.9  4.0   < > 3.9  --  4.1  --  4.0 4.0  --  3.9  CL 96*  96*   < > 97*  --  99  --  100 100  --  96*  CO2 26  27   < > 25  --  25  --  27 25  --  24  GLUCOSE 92  95   < > 63*  --  83  --  86 60*  --  76  BUN 22  23   < > 16  --  22  --  12 15  --  14  CREATININE 4.77*  4.83*   < > 3.92*  --  4.99*  --  3.49* 4.44*  --  4.82*  CALCIUM 8.4*  8.5*   < > 8.4*   < > 8.1*   < > 8.2* 8.4*  --  8.5*  MG 1.9   < > 2.1   < > 2.0  --  1.9 2.1 1.9 2.0  PHOS 3.5  --  3.1  --   --   --   --   --   --   --    < > = values in this interval not  displayed.     CBC: Recent Labs  Lab 06/20/20 0634 06/21/20 0644 06/22/20 0547 06/23/20 1053 06/25/20 0641  WBC 4.4 3.7* 3.9* 5.4 5.1  HGB 8.2* 7.7* 7.6* 8.7* 7.9*  HCT 24.4* 22.3* 23.3* 26.4* 24.3*  MCV 85.9 85.4 88.6 87.7 90.0  PLT 183 154 172 224 218      Lab Results  Component Value Date   HEPBSAG NON REACTIVE 04/14/2020   HEPBIGM  06/12/2010    NEGATIVE (NOTE) High levels of Hepatitis B Core IgM antibody are detectable during the acute stage of Hepatitis B. This antibody is used to differentiate current from past HBV infection.       Microbiology:  No results  found for this or any previous visit (from the past 240 hour(s)).  Coagulation Studies: No results for input(s): LABPROT, INR in the last 72 hours.  Urinalysis: No results for input(s): COLORURINE, LABSPEC, PHURINE, GLUCOSEU, HGBUR, BILIRUBINUR, KETONESUR, PROTEINUR, UROBILINOGEN, NITRITE, LEUKOCYTESUR in the last 72 hours.  Invalid input(s): APPERANCEUR    Imaging: No results found.   Medications:   . sodium chloride 200 mL (06/21/20 1151)  . [START ON 07/02/2020] vancomycin     . amLODipine  10 mg Oral Daily  . aspirin EC  81 mg Oral Daily  . atorvastatin  20 mg Oral Daily  . carvedilol  6.25 mg Oral BID WC  . chlorhexidine gluconate (MEDLINE KIT)  15 mL Mouth Rinse BID  . Chlorhexidine Gluconate Cloth  6 each Topical Daily  . cholestyramine light  4 g Oral BID  . dextrose  1 ampule Intravenous Once  . epoetin (EPOGEN/PROCRIT) injection  10,000 Units Intravenous Q M,W,F-HD  . feeding supplement (ENSURE ENLIVE)  237 mL Oral Q24H  . ferrous sulfate  325 mg Oral Daily  . gabapentin  100 mg Oral QPM  . heparin  5,000 Units Subcutaneous Q8H  . hydrALAZINE  50 mg Oral Q8H  . hydrOXYzine  25 mg Oral Daily  . lipase/protease/amylase  12,000 Units Oral TID WC  . multivitamin  1 tablet Oral Daily  . pantoprazole  40 mg Oral QHS  . sevelamer carbonate  800 mg Oral TID WC  . cyanocobalamin  1,000  mcg Oral Daily   sodium chloride, acetaminophen **OR** acetaminophen, alum & mag hydroxide-simeth, calcium carbonate, calcium carbonate, camphor-menthol **AND** hydrOXYzine, docusate sodium, guaiFENesin-dextromethorphan, haloperidol lactate, hydrALAZINE, ipratropium-albuterol, loperamide, naLOXone (NARCAN)  injection, ondansetron **OR** [DISCONTINUED] ondansetron (ZOFRAN) IV, oxyCODONE-acetaminophen, polyethylene glycol, promethazine, sorbitol, zolpidem  Assessment/ Plan:  59 y.o. female was admitted on 06/02/2020 for  Principal Problem:   Bacteremia due to Enterococcus Active Problems:   Acute respiratory failure with hypoxia (HCC)   Anemia of chronic disease   Acute diastolic CHF (congestive heart failure) (HCC)   COPD (chronic obstructive pulmonary disease) (HCC)   ESRD on hemodialysis (HCC)   Benign essential HTN   Obesity (BMI 30-39.9)  ESRD (end stage renal disease) ([place]) [N18.6] CAP (community acquired pneumonia) [J18.9] ESRD on dialysis ([place]) [N18.6, Z99.2] Dyspnea, unspecified type [R06.00] Acute on chronic congestive heart failure, unspecified heart failure type ([place]) [I50.9]  #. ESRD Patient has switched to [facility] as outpatient -Will receive Dialysis treatment today -Right upper arm old AV fistula site with staples intact, no signs and symptoms of infection  #. Anemia of CKD  Lab Results  Component Value Date   HGB 7.9 (L) 06/25/2020   Continue Epogen 10,000 units with dialysis   #. Secondary hyperparathyroidism of renal origin N 25.81      Component Value Date/Time   PTH 357 (H) 10/05/2018 1241   Lab Results  Component Value Date   PHOS 3.1 06/19/2020  Patient is on Renvela We will follow up labs  #. Diabetes type 2 with CKD Hemoglobin A1C (%)  Date Value  07/19/2013 9.3 (H)   Hgb A1c MFr Bld (%)  Date Value  05/03/2020 5.7 (H)  Glycemic control per hospitalist.  #Enterococcal bacteremia TEE Negative for endocarditis Continue vancomycin  Monday Wednesday Friday with dialysis    LOS: [address] 10/4/202110:49 [place], [place]   \

## 2020-06-25 NOTE — TOC Transition Note (Signed)
Transition of Care [facility]) - CM/SW Discharge Note   Patient Details  Name: [name] MRN: [id number] Date of Birth: Aug 16, 1961  Transition of [facility]) CM/SW Contact:  Candie Chroman, LCSW Phone Number: 06/25/2020, 4:38 PM   Clinical Narrative:  Patient has orders to discharge to [facility] today. EMS transport has been set up for 5:30 pm. RN will call report to [phone] (Room 29A). No further concerns. CSW signing off.   Final next level of care: Skilled Nursing Facility Barriers to Discharge: Barriers Resolved   Patient Goals and CMS Choice     Choice offered to / list presented to : Patient  Discharge Placement   Existing PASRR number confirmed : 06/22/20          Patient chooses bed at: [facility] Patient to be transferred to facility by: EMS Name of family member notified: Bich Mchaney Patient and family notified of of transfer: 06/25/20  Discharge Plan and Services                                     Social Determinants of Health (SDOH) Interventions     Readmission Risk Interventions Readmission Risk Prevention Plan 06/06/2020 05/03/2020  Transportation Screening Complete Complete  Medication Review Press photographer) Complete Complete  PCP or Specialist appointment within 3-5 days of discharge Complete Complete  HRI or Home Care Consult Complete -  SW Recovery Care/Counseling Consult - Complete  Palliative Care Screening Not Applicable Not [place] Not Applicable Not Applicable  Some recent data might be hidden

## 2020-06-25 NOTE — TOC Progression Note (Addendum)
Transition of Care [facility]) - Progression Note    Patient Details  Name: [name] MRN: [id number] Date of Birth: 06-16-1961  Transition of Care [facility]) CM/SW [place], LCSW Phone Number: 06/25/2020, 10:47 AM  Clinical Narrative:   [place] is able to accept patient. Insurance authorization is approved.  3:59 pm: Provided update to patient at bedside. Son was on speakerphone. They are aware of plan for discharge to [facility] today once COVID results are in and are agreeable.  Expected Discharge Plan and Services                                                 Social Determinants of Health (SDOH) Interventions    Readmission Risk Interventions Readmission Risk Prevention Plan 06/06/2020 05/03/2020  Transportation Screening Complete Complete  Medication Review Press photographer) Complete Complete  PCP or Specialist appointment within 3-5 days of discharge Complete Complete  HRI or Home Care Consult Complete -  SW Recovery Care/Counseling Consult - Complete  Palliative Care Screening Not Applicable Not [place] Not Applicable Not Applicable  Some recent data might be hidden

## 2020-06-25 NOTE — Care Management Important Message (Signed)
Important Message  Patient Details  Name: [name] MRN: [id number] Date of Birth: [birth year]   Medicare Important Message Given:  Other (see comment)  Attempted to review Medicare IM with patient via room phone due to isolation status, however no answer.     Dannette Barbara 06/25/2020, 3:20 PM

## 2020-06-26 DIAGNOSIS — R1311 Dysphagia, oral phase: Secondary | ICD-10-CM | POA: Diagnosis not present

## 2020-06-26 DIAGNOSIS — A4181 Sepsis due to Enterococcus: Secondary | ICD-10-CM | POA: Diagnosis not present

## 2020-06-26 DIAGNOSIS — I5033 Acute on chronic diastolic (congestive) heart failure: Secondary | ICD-10-CM | POA: Diagnosis not present

## 2020-06-26 DIAGNOSIS — N186 End stage renal disease: Secondary | ICD-10-CM | POA: Diagnosis not present

## 2020-06-27 DIAGNOSIS — N186 End stage renal disease: Secondary | ICD-10-CM | POA: Diagnosis not present

## 2020-06-27 DIAGNOSIS — Z992 Dependence on renal dialysis: Secondary | ICD-10-CM | POA: Diagnosis not present

## 2020-06-27 DIAGNOSIS — T827XXA Infection and inflammatory reaction due to other cardiac and vascular devices, implants and grafts, initial encounter: Secondary | ICD-10-CM | POA: Diagnosis not present

## 2020-06-29 DIAGNOSIS — T827XXA Infection and inflammatory reaction due to other cardiac and vascular devices, implants and grafts, initial encounter: Secondary | ICD-10-CM | POA: Diagnosis not present

## 2020-06-29 DIAGNOSIS — N186 End stage renal disease: Secondary | ICD-10-CM | POA: Diagnosis not present

## 2020-06-29 DIAGNOSIS — Z992 Dependence on renal dialysis: Secondary | ICD-10-CM | POA: Diagnosis not present

## 2020-07-02 DIAGNOSIS — N186 End stage renal disease: Secondary | ICD-10-CM | POA: Diagnosis not present

## 2020-07-02 DIAGNOSIS — T827XXA Infection and inflammatory reaction due to other cardiac and vascular devices, implants and grafts, initial encounter: Secondary | ICD-10-CM | POA: Diagnosis not present

## 2020-07-02 DIAGNOSIS — Z992 Dependence on renal dialysis: Secondary | ICD-10-CM | POA: Diagnosis not present

## 2020-07-03 DIAGNOSIS — A4181 Sepsis due to Enterococcus: Secondary | ICD-10-CM | POA: Diagnosis not present

## 2020-07-03 DIAGNOSIS — M6259 Muscle wasting and atrophy, not elsewhere classified, multiple sites: Secondary | ICD-10-CM | POA: Diagnosis not present

## 2020-07-03 DIAGNOSIS — I120 Hypertensive chronic kidney disease with stage 5 chronic kidney disease or end stage renal disease: Secondary | ICD-10-CM | POA: Diagnosis not present

## 2020-07-03 DIAGNOSIS — N186 End stage renal disease: Secondary | ICD-10-CM | POA: Diagnosis not present

## 2020-07-04 DIAGNOSIS — T827XXA Infection and inflammatory reaction due to other cardiac and vascular devices, implants and grafts, initial encounter: Secondary | ICD-10-CM | POA: Diagnosis not present

## 2020-07-04 DIAGNOSIS — Z992 Dependence on renal dialysis: Secondary | ICD-10-CM | POA: Diagnosis not present

## 2020-07-04 DIAGNOSIS — N186 End stage renal disease: Secondary | ICD-10-CM | POA: Diagnosis not present

## 2020-07-04 NOTE — Progress Notes (Deleted)
   Patient ID: [name], female    DOB: 07-Apr-1961, 59 y.o.   MRN: [id number]  HPI  [name] is a 59 y/o female with a history of  Echo report from 06/11/20 reviewed and showed an EF of 50-55% along with mild LAE and mild/moderate MR.   LHC done 07/26/15 showed:  Ost RCA to Prox RCA lesion, 30% stenosed. The lesion was previously treated with a stent (unknown type).  RPDA lesion, 40% stenosed.  Mid Cx lesion, 30% stenosed.  Mid LAD to Dist LAD lesion, 30% stenosed.  Stent in RCA patent, no significant disease in LAD/LCX and normal LVEF.  Admitted 06/02/20 due to pneumonia and fluid overload after missing dialysis. BP elevated on admission. Dialysis was completed. Blood cultures obtained which showed Enterococcus faecalis. Cardiology and ID consults obtained. TEE negative for endocarditis. Needed vasopressors following TEE due to hypotension, bradycardia and near cardiac arrest; transferred to ICU & intubated. On 9/22 Vascular surgery removed the infected RUE AV graft and performed endarterectomy of the R brachial artery with Cormatrix patch. Needed 2 units of PRBC's. IV antibiotics given with transition to oral medications. Discharged after 23 days to [facility].   She presents today for her initial visit with a chief complaint of   Review of Systems    Physical Exam    Assessment & Plan:  1: Chronic heart failure with preserved ejection fraction with slight structural changes- - NYHA class - BNP 05/22/20 showed 620.7  2: HTN- - BP - currently seeing facility PCP - BMP 06/25/20 reviewed and showed sodium 132, potassium 3.9, creatinine 4.82 and GFR 11  3: ESRD on dialysis- - dialysis M,W,F - saw vascular (Schnier) 05/31/20

## 2020-07-05 ENCOUNTER — Ambulatory Visit: Payer: Medicare HMO | Admitting: Family

## 2020-07-06 DIAGNOSIS — J449 Chronic obstructive pulmonary disease, unspecified: Secondary | ICD-10-CM | POA: Diagnosis not present

## 2020-07-06 DIAGNOSIS — R1311 Dysphagia, oral phase: Secondary | ICD-10-CM | POA: Diagnosis not present

## 2020-07-06 DIAGNOSIS — E46 Unspecified protein-calorie malnutrition: Secondary | ICD-10-CM | POA: Diagnosis not present

## 2020-07-06 DIAGNOSIS — E0840 Diabetes mellitus due to underlying condition with diabetic neuropathy, unspecified: Secondary | ICD-10-CM | POA: Diagnosis not present

## 2020-07-09 DIAGNOSIS — T827XXA Infection and inflammatory reaction due to other cardiac and vascular devices, implants and grafts, initial encounter: Secondary | ICD-10-CM | POA: Diagnosis not present

## 2020-07-09 DIAGNOSIS — Z992 Dependence on renal dialysis: Secondary | ICD-10-CM | POA: Diagnosis not present

## 2020-07-09 DIAGNOSIS — N186 End stage renal disease: Secondary | ICD-10-CM | POA: Diagnosis not present

## 2020-07-10 DIAGNOSIS — J449 Chronic obstructive pulmonary disease, unspecified: Secondary | ICD-10-CM | POA: Diagnosis not present

## 2020-07-11 DIAGNOSIS — N186 End stage renal disease: Secondary | ICD-10-CM | POA: Diagnosis not present

## 2020-07-11 DIAGNOSIS — T827XXA Infection and inflammatory reaction due to other cardiac and vascular devices, implants and grafts, initial encounter: Secondary | ICD-10-CM | POA: Diagnosis not present

## 2020-07-11 DIAGNOSIS — Z992 Dependence on renal dialysis: Secondary | ICD-10-CM | POA: Diagnosis not present

## 2020-07-12 ENCOUNTER — Ambulatory Visit (INDEPENDENT_AMBULATORY_CARE_PROVIDER_SITE_OTHER): Payer: Medicare HMO | Admitting: Vascular Surgery

## 2020-07-12 DIAGNOSIS — E1122 Type 2 diabetes mellitus with diabetic chronic kidney disease: Secondary | ICD-10-CM | POA: Diagnosis not present

## 2020-07-12 DIAGNOSIS — R1311 Dysphagia, oral phase: Secondary | ICD-10-CM | POA: Diagnosis not present

## 2020-07-12 DIAGNOSIS — E43 Unspecified severe protein-calorie malnutrition: Secondary | ICD-10-CM | POA: Diagnosis not present

## 2020-07-12 DIAGNOSIS — E1140 Type 2 diabetes mellitus with diabetic neuropathy, unspecified: Secondary | ICD-10-CM | POA: Diagnosis not present

## 2020-07-12 DIAGNOSIS — E1151 Type 2 diabetes mellitus with diabetic peripheral angiopathy without gangrene: Secondary | ICD-10-CM | POA: Diagnosis not present

## 2020-07-12 DIAGNOSIS — I132 Hypertensive heart and chronic kidney disease with heart failure and with stage 5 chronic kidney disease, or end stage renal disease: Secondary | ICD-10-CM | POA: Diagnosis not present

## 2020-07-12 DIAGNOSIS — I5032 Chronic diastolic (congestive) heart failure: Secondary | ICD-10-CM | POA: Diagnosis not present

## 2020-07-12 DIAGNOSIS — N186 End stage renal disease: Secondary | ICD-10-CM | POA: Diagnosis not present

## 2020-07-12 DIAGNOSIS — J449 Chronic obstructive pulmonary disease, unspecified: Secondary | ICD-10-CM | POA: Diagnosis not present

## 2020-07-13 DIAGNOSIS — T827XXA Infection and inflammatory reaction due to other cardiac and vascular devices, implants and grafts, initial encounter: Secondary | ICD-10-CM | POA: Diagnosis not present

## 2020-07-13 DIAGNOSIS — R6889 Other general symptoms and signs: Secondary | ICD-10-CM | POA: Diagnosis not present

## 2020-07-13 DIAGNOSIS — Z992 Dependence on renal dialysis: Secondary | ICD-10-CM | POA: Diagnosis not present

## 2020-07-13 DIAGNOSIS — N186 End stage renal disease: Secondary | ICD-10-CM | POA: Diagnosis not present

## 2020-07-14 DIAGNOSIS — R1311 Dysphagia, oral phase: Secondary | ICD-10-CM | POA: Diagnosis not present

## 2020-07-14 DIAGNOSIS — J449 Chronic obstructive pulmonary disease, unspecified: Secondary | ICD-10-CM | POA: Diagnosis not present

## 2020-07-14 DIAGNOSIS — E43 Unspecified severe protein-calorie malnutrition: Secondary | ICD-10-CM | POA: Diagnosis not present

## 2020-07-14 DIAGNOSIS — E1122 Type 2 diabetes mellitus with diabetic chronic kidney disease: Secondary | ICD-10-CM | POA: Diagnosis not present

## 2020-07-14 DIAGNOSIS — I132 Hypertensive heart and chronic kidney disease with heart failure and with stage 5 chronic kidney disease, or end stage renal disease: Secondary | ICD-10-CM | POA: Diagnosis not present

## 2020-07-14 DIAGNOSIS — E1140 Type 2 diabetes mellitus with diabetic neuropathy, unspecified: Secondary | ICD-10-CM | POA: Diagnosis not present

## 2020-07-14 DIAGNOSIS — E1151 Type 2 diabetes mellitus with diabetic peripheral angiopathy without gangrene: Secondary | ICD-10-CM | POA: Diagnosis not present

## 2020-07-14 DIAGNOSIS — N186 End stage renal disease: Secondary | ICD-10-CM | POA: Diagnosis not present

## 2020-07-14 DIAGNOSIS — I5032 Chronic diastolic (congestive) heart failure: Secondary | ICD-10-CM | POA: Diagnosis not present

## 2020-07-16 DIAGNOSIS — Z992 Dependence on renal dialysis: Secondary | ICD-10-CM | POA: Diagnosis not present

## 2020-07-16 DIAGNOSIS — T827XXA Infection and inflammatory reaction due to other cardiac and vascular devices, implants and grafts, initial encounter: Secondary | ICD-10-CM | POA: Diagnosis not present

## 2020-07-16 DIAGNOSIS — Z79899 Other long term (current) drug therapy: Secondary | ICD-10-CM | POA: Diagnosis not present

## 2020-07-16 DIAGNOSIS — N186 End stage renal disease: Secondary | ICD-10-CM | POA: Diagnosis not present

## 2020-07-17 ENCOUNTER — Ambulatory Visit: Payer: Medicare HMO | Admitting: Family

## 2020-07-17 DIAGNOSIS — G894 Chronic pain syndrome: Secondary | ICD-10-CM | POA: Diagnosis not present

## 2020-07-17 DIAGNOSIS — F331 Major depressive disorder, recurrent, moderate: Secondary | ICD-10-CM | POA: Diagnosis not present

## 2020-07-17 DIAGNOSIS — E11649 Type 2 diabetes mellitus with hypoglycemia without coma: Secondary | ICD-10-CM | POA: Diagnosis not present

## 2020-07-18 DIAGNOSIS — Z992 Dependence on renal dialysis: Secondary | ICD-10-CM | POA: Diagnosis not present

## 2020-07-18 DIAGNOSIS — N186 End stage renal disease: Secondary | ICD-10-CM | POA: Diagnosis not present

## 2020-07-18 DIAGNOSIS — T827XXA Infection and inflammatory reaction due to other cardiac and vascular devices, implants and grafts, initial encounter: Secondary | ICD-10-CM | POA: Diagnosis not present

## 2020-07-18 NOTE — Progress Notes (Signed)
MRN : [id number]  [name] is a 59 y.o. (June 23, 1961) female who presents with chief complaint of problems with my access.  History of Present Illness:    The patient is seen for evaluation of dialysis access.  The patient has a history of multiple failed accesses.  There have been accesses in both arms and in the thighs.    Current access is via a catheter which is functioning poorly.  There have been several episodes of catheter infection as well as graft infection.  The Patient has also had life threatening hemorrhage from her left arm graft in the past.  The patient denies amaurosis fugax or recent TIA symptoms. There are no recent neurological changes noted. The patient denies claudication symptoms or rest pain symptoms. The patient denies history of DVT, PE or superficial thrombophlebitis. The patient denies recent episodes of angina or shortness of breath.    No outpatient medications have been marked as taking for the 07/19/20 encounter (Appointment) with Delana Meyer, Dolores Lory, MD.    Past Medical History:  Diagnosis Date  . Anemia   . Anginal pain ([place])   . Anxiety   . Arthritis   . Asthma   . Broken wrist   . Bronchitis   . chronic diastolic CHF 12/22/270  . Chronic kidney disease   . COPD (chronic obstructive pulmonary disease) ([place])   . Coronary artery disease    a. cath 2013: stenting to RCA (report not available); b. cath 2014: LM nl, pLAD 40%, mLAD nl, ost LCx 40%, mid LCx nl, pRCA 30% @ site of prior stent, mRCA 50%  . Depression   . Diabetes mellitus ([place])   . Diabetes mellitus without complication ([place])   . Diabetic neuropathy ([place])   . dialysis 2006  . Diverticulosis   . Dizziness   . Dyspnea   . Elevated lipids   . Environmental and seasonal allergies   . ESRD (end stage renal disease) on dialysis ([place])    M-W-F  . Gastroparesis   . GERD (gastroesophageal reflux disease)   . Headache   . History of anemia due to chronic kidney disease   . History  of hiatal hernia   . HOH (hard of hearing)   . Hx of pancreatitis 2015  . Hypertension   . Lower extremity edema   . Mitral regurgitation    a. echo 10/2013: EF 62%, noWMA, mildly dilated LA, mild to mod MR/TR, GR1DD  . Myocardial infarction ([place])   . Orthopnea   . Parathyroid abnormality ([place])   . Peripheral arterial disease ([place])   . Pneumonia   . Renal cancer ([place])   . Renal insufficiency    Pt is on dialysis on M,W + F.  . Wheezing     Past Surgical History:  Procedure Laterality Date  . A/V SHUNTOGRAM Left 01/20/2018   Procedure: A/V SHUNTOGRAM;  Surgeon: Algernon Huxley, MD;  Location: [place] [facility];  Service: Cardiovascular;  Laterality: Left;  . ABDOMINAL HYSTERECTOMY  1992  . AMPUTATION TOE Left 10/02/2017   Procedure: AMPUTATION TOE-LEFT GREAT TOE;  Surgeon: Albertine Patricia, DPM;  Location: [facility];  Service: Podiatry;  Laterality: Left;  . APPENDECTOMY    . APPLICATION OF WOUND VAC N/A 11/25/2019   Procedure: APPLICATION OF WOUND VAC;  Surgeon: Katha Cabal, MD;  Location: [facility];  Service: Vascular;  Laterality: N/A;  . ARTERY BIOPSY Right 10/11/2018   Procedure: BIOPSY TEMPORAL ARTERY;  Surgeon: Vickie Epley,  MD;  Location: [facility];  Service: General;  Laterality: Right;  . CARDIAC CATHETERIZATION Left 07/26/2015   Procedure: Left Heart Cath and Coronary Angiography;  Surgeon: Dionisio David, MD;  Location: [place] [facility];  Service: Cardiovascular;  Laterality: Left;  . CATARACT EXTRACTION W/ INTRAOCULAR LENS IMPLANT Right   . CATARACT EXTRACTION W/PHACO Left 03/10/2017   Procedure: CATARACT EXTRACTION PHACO AND INTRAOCULAR LENS PLACEMENT (IOC);  Surgeon: Birder Robson, MD;  Location: [facility];  Service: Ophthalmology;  Laterality: Left;  [place] 00:51.9 AP% 14.2 CDE 7.39 fluid pack lot # [id number]  . CENTRAL LINE INSERTION Right 11/11/2019   Procedure: CENTRAL LINE INSERTION;  Surgeon: Katha Cabal, MD;  Location: [facility];  Service: Vascular;   Laterality: Right;  . CENTRAL LINE INSERTION  11/25/2019   Procedure: CENTRAL LINE INSERTION;  Surgeon: Katha Cabal, MD;  Location: [facility];  Service: Vascular;;  . CHOLECYSTECTOMY    . COLONOSCOPY WITH PROPOFOL N/A 08/12/2016   Procedure: COLONOSCOPY WITH PROPOFOL;  Surgeon: Lollie Sails, MD;  Location: [facility];  Service: Endoscopy;  Laterality: N/A;  . DIALYSIS FISTULA CREATION Left    upper arm  . dialysis grafts    . DIALYSIS/PERMA CATHETER INSERTION N/A 11/14/2019   Procedure: DIALYSIS/PERMA CATHETER INSERTION;  Surgeon: Algernon Huxley, MD;  Location: [place] [facility];  Service: Cardiovascular;  Laterality: N/A;  . DIALYSIS/PERMA CATHETER INSERTION N/A 02/03/2020   Procedure: DIALYSIS/PERMA CATHETER INSERTION;  Surgeon: Katha Cabal, MD;  Location: [place] [facility];  Service: Cardiovascular;  Laterality: N/A;  . DIALYSIS/PERMA CATHETER INSERTION N/A 06/19/2020   Procedure: DIALYSIS/PERMA CATHETER INSERTION;  Surgeon: Katha Cabal, MD;  Location: [place] [facility];  Service: Cardiovascular;  Laterality: N/A;  . DIALYSIS/PERMA CATHETER REMOVAL N/A 05/25/2020   Procedure: DIALYSIS/PERMA CATHETER REMOVAL;  Surgeon: Katha Cabal, MD;  Location: [place] [facility];  Service: Cardiovascular;  Laterality: N/A;  . ESOPHAGOGASTRODUODENOSCOPY N/A 03/08/2015   Procedure: ESOPHAGOGASTRODUODENOSCOPY (EGD);  Surgeon: Manya Silvas, MD;  Location: [facility];  Service: Endoscopy;  Laterality: N/A;  . ESOPHAGOGASTRODUODENOSCOPY (EGD) WITH PROPOFOL N/A 03/18/2016   Procedure: ESOPHAGOGASTRODUODENOSCOPY (EGD) WITH PROPOFOL;  Surgeon: [name], MD;  Location: [facility];  Service: Endoscopy;  Laterality: N/A;  . EYE SURGERY Right 2018  . FECAL TRANSPLANT N/A 08/23/2015   Procedure: FECAL TRANSPLANT;  Surgeon: Manya Silvas, MD;  Location: [facility] END[facility]COPY;  Service: Endoscopy;  Laterality: N/A;  . HAND SURGERY Bilateral   . HEMATOMA EVACUATION Left  11/25/2019   Procedure: EVACUATION HEMATOMA;  Surgeon: Katha Cabal, MD;  Location: [facility];  Service: Vascular;  Laterality: Left;  . I & D EXTREMITY Left 11/25/2019   Procedure: IRRIGATION AND DEBRIDEMENT EXTREMITY;  Surgeon: Katha Cabal, MD;  Location: [facility];  Service: Vascular;  Laterality: Left;  . IR FLUORO GUIDE CV LINE RIGHT  04/06/2020  . IR RADIOLOGIST EVAL & MGMT  07/28/2019  . IR RADIOLOGIST EVAL & MGMT  08/11/2019  . LIGATION OF ARTERIOVENOUS  FISTULA Left 11/11/2019  . LIGATION OF ARTERIOVENOUS  FISTULA Left 11/11/2019   Procedure: LIGATION OF ARTERIOVENOUS  FISTULA;  Surgeon: Katha Cabal, MD;  Location: [facility];  Service: Vascular;  Laterality: Left;  . LIGATIONS OF HERO GRAFT Right 06/13/2020   Procedure: LIGATION / REMOVAL OF RIGHT HERO GRAFT;  Surgeon: Katha Cabal, MD;  Location: [facility];  Service: Vascular;  Laterality: Right;  . PERIPHERAL VASCULAR CATHETERIZATION N/A 12/20/2015   Procedure: Thrombectomy of  dialysis access versus permcath placement;  Surgeon: Algernon Huxley, MD;  Location: [place] [facility];  Service: Cardiovascular;  Laterality: N/A;  . PERIPHERAL VASCULAR CATHETERIZATION N/A 12/20/2015   Procedure: A/V Shunt Intervention;  Surgeon: Algernon Huxley, MD;  Location: [place] [facility];  Service: Cardiovascular;  Laterality: N/A;  . PERIPHERAL VASCULAR CATHETERIZATION N/A 12/20/2015   Procedure: A/V Shuntogram/Fistulagram;  Surgeon: Algernon Huxley, MD;  Location: [place] [facility];  Service: Cardiovascular;  Laterality: N/A;  . PERIPHERAL VASCULAR CATHETERIZATION N/A 01/02/2016   Procedure: A/V Shuntogram/Fistulagram;  Surgeon: Algernon Huxley, MD;  Location: [place] [facility];  Service: Cardiovascular;  Laterality: N/A;  . PERIPHERAL VASCULAR CATHETERIZATION N/A 01/02/2016   Procedure: A/V Shunt Intervention;  Surgeon: Algernon Huxley, MD;  Location: [place] [facility];  Service: Cardiovascular;  Laterality: N/A;  . TEE WITHOUT CARDIOVERSION  N/A 06/11/2020   Procedure: TRANSESOPHAGEAL ECHOCARDIOGRAM (TEE);  Surgeon: Teodoro Spray, MD;  Location: [facility];  Service: Cardiovascular;  Laterality: N/A;  . UPPER EXTREMITY VENOGRAPHY Right 01/18/2020   Procedure: UPPER EXTREMITY VENOGRAPHY;  Surgeon: Katha Cabal, MD;  Location: [place] [facility];  Service: Cardiovascular;  Laterality: Right;  Marland Kitchen VASCULAR ACCESS DEVICE INSERTION Right 04/13/2020   Procedure: INSERTION OF HERO VASCULAR ACCESS DEVICE (GRAFT);  Surgeon: Katha Cabal, MD;  Location: [facility];  Service: Vascular;  Laterality: Right;    Social History Social History   Tobacco Use  . Smoking status: Never Smoker  . Smokeless tobacco: Never Used  Vaping Use  . Vaping Use: Never used  Substance Use Topics  . Alcohol use: Not Currently    Comment: glass wine week per pt  . Drug use: Yes    Types: Marijuana    Comment: once a day    Family History Family History  Problem Relation Age of Onset  . Kidney disease Mother   . Diabetes Mother   . Cancer Father   . Kidney disease Sister     Allergies  Allergen Reactions  . Ace Inhibitors Swelling and Anaphylaxis  . Ativan [Lorazepam] Other (See Comments)    Reaction:Hallucinations and headaches  . Compazine [Prochlorperazine Edisylate] Anaphylaxis, Nausea And Vomiting and Other (See Comments)    Other reaction(s): dystonia from this vs. Reglan, 23 Jul - patient relates that she takes promethazine frequently with no problems  . Sumatriptan Succinate Other (See Comments)    Other reaction(s): delirium and hallucinations per [facility] records  . Zofran [Ondansetron] Nausea And Vomiting    Per pt. she is allergic to zofran or will experience adverse reaction like hallucination   . Losartan Nausea Only  . Prochlorperazine Other (See Comments)    Reaction:  Unknown . Patient does not remember reaction but she does have vertigo and anxiety along with n and v at times. Could be used to treat any of these   .  Reglan [Metoclopramide] Other (See Comments)    Per patient her Dr. Evelina Bucy her off it   . Scopolamine Other (See Comments)    Dizziness, also has vertigo already  . Tape Rash    Plastic tape causes rash  . Tapentadol Rash     REVIEW OF SYSTEMS (Negative unless checked)  Constitutional: [] Weight loss  [] Fever  [] Chills Cardiac: [] Chest pain   [] Chest pressure   [] Palpitations   [] Shortness of breath when laying flat   [] Shortness of breath with exertion. Vascular:  [x] Pain in legs with walking   [] Pain in legs at  rest  [] History of DVT   [] Phlebitis   [x] Swelling in legs   [] Varicose veins   [] Non-healing ulcers Pulmonary:   [] Uses home oxygen   [] Productive cough   [] Hemoptysis   [] Wheeze  [] COPD   [] Asthma Neurologic:  [] Dizziness   [] Seizures   [] History of stroke   [] History of TIA  [] Aphasia   [] Vissual changes   [] Weakness or numbness in arm   [x] Weakness or numbness in leg Musculoskeletal:   [] Joint swelling   [x] Joint pain   [x] Low back pain Hematologic:  [] Easy bruising  [] Easy bleeding   [] Hypercoagulable state   [] Anemic Gastrointestinal:  [] Diarrhea   [] Vomiting  [] Gastroesophageal reflux/heartburn   [] Difficulty swallowing. Genitourinary:  [x] Chronic kidney disease   [] Difficult urination  [] Frequent urination   [] Blood in urine Skin:  [] Rashes   [] Ulcers  Psychological:  [] History of anxiety   []  History of major depression.  Physical Examination  There were no vitals filed for this visit. There is no height or weight on file to calculate BMI. Gen: WD/WN, NAD Head: [place]/AT, No temporalis wasting.  Ear/Nose/Throat: Hearing grossly intact, nares w/o erythema or drainage Eyes: PER, EOMI, sclera nonicteric.  Neck: Supple, no large masses.   Pulmonary:  Good air movement, no audible wheezing bilaterally, no use of accessory muscles.  Cardiac: RRR, no JVD Vascular: moon facies consistent SVC syndrome; multiple scars bilateral arms.  Vessel Right Left  Radial Not Palpable Not  Palpable  Ulnar Not Palpable Not Palpable  Brachial Palpable Palpable  Gastrointestinal: Non-distended. No guarding/no peritoneal signs.  Musculoskeletal: M/S 5/5 throughout.  No deformity or atrophy.  Neurologic: CN 2-12 intact. Symmetrical.  Speech is fluent. Motor exam as listed above. Psychiatric: Judgment intact, Mood & affect appropriate for pt's clinical situation. Dermatologic: No rashes or ulcers noted.  No changes consistent with cellulitis.   CBC Lab Results  Component Value Date   WBC 5.1 06/25/2020   HGB 7.9 (L) 06/25/2020   HCT 24.3 (L) 06/25/2020   MCV 90.0 06/25/2020   PLT 218 06/25/2020    BMET    Component Value Date/Time   NA 132 (L) 06/25/2020 0641   NA 135 (L) 09/15/2014 0948   K 3.9 06/25/2020 0641   K 4.8 09/15/2014 0948   CL 96 (L) 06/25/2020 0641   CL 99 09/15/2014 0948   CO2 24 06/25/2020 0641   CO2 26 09/15/2014 0948   GLUCOSE 76 06/25/2020 0641   GLUCOSE 118 (H) 09/15/2014 0948   BUN 14 06/25/2020 0641   BUN 19 (H) 09/15/2014 0948   CREATININE 4.82 (H) 06/25/2020 0641   CREATININE 6.79 (H) 09/15/2014 0948   CALCIUM 8.5 (L) 06/25/2020 0641   CALCIUM 8.3 (L) 09/15/2014 0948   GFRNONAA 9 (L) 06/25/2020 0641   GFRNONAA 7 (L) 09/15/2014 0948   GFRNONAA 6 (L) 05/31/2014 0432   GFRAA 11 (L) 06/25/2020 0641   GFRAA 8 (L) 09/15/2014 0948   GFRAA 7 (L) 05/31/2014 0432   CrCl cannot be calculated (Patient's most recent lab result is older than the maximum 21 days allowed.).  COAG Lab Results  Component Value Date   INR 1.3 (H) 06/13/2020   INR 1.0 04/13/2020   INR 1.1 03/29/2020    Radiology PERIPHERAL VASCULAR CATHETERIZATION  Result Date: 06/19/2020 See op note    Assessment/Plan 1. Complication of arteriovenous dialysis fistula, subsequent encounter Recommend:  The patient is doing well and currently has adequate catheter based access dialysis access.  The patient's dialysis center is not reporting  any access  issues.  Unfortunately she has very limited options for dialysis and has not faired well with extremity grafts.  The patient should have a duplex ultrasound of the dialysis access in 2 months.   A total of 30 minutes was spent with this patient and greater than 50% was spent in counseling and coordination of care with the patient.  Discussion included the treatment options for vascular disease including indications for surgery and intervention.  Also discussed is the appropriate timing of treatment.  In addition medical therapy was discussed.   2. Atherosclerosis of native artery of both lower extremities with intermittent claudication (HCC)  Recommend:  The patient has evidence of atherosclerosis of the lower extremities with claudication.  The patient does not voice lifestyle limiting changes at this point in time.  Noninvasive studies do not suggest clinically significant change.  No invasive studies, angiography or surgery at this time The patient should continue walking and begin a more formal exercise program.  The patient should continue antiplatelet therapy and aggressive treatment of the lipid abnormalities  No changes in the patient's medications at this time  The patient should continue wearing graduated compression socks 10-15 mmHg strength to control the mild edema.    3. ESRD (end stage renal disease) ([place]) At the present time the patient has adequate dialysis access.  Continue hemodialysis as ordered without interruption.  Avoid nephrotoxic medications and dehydration.  Further plans per nephrology  4. Chronic obstructive pulmonary disease, unspecified COPD type ([place]) Continue pulmonary medications and aerosols as already ordered, these medications have been reviewed and there are no changes at this time.    5. Coronary artery disease involving native coronary artery of native heart without angina pectoris Continue cardiac and antihypertensive medications as  already ordered and reviewed, no changes at this time.  Continue statin as ordered and reviewed, no changes at this time  Nitrates PRN for chest pain     Hortencia Pilar, MD  07/18/2020 7:23 PM

## 2020-07-19 ENCOUNTER — Other Ambulatory Visit: Payer: Self-pay

## 2020-07-19 ENCOUNTER — Ambulatory Visit (INDEPENDENT_AMBULATORY_CARE_PROVIDER_SITE_OTHER): Payer: Medicare HMO | Admitting: Vascular Surgery

## 2020-07-19 ENCOUNTER — Encounter (INDEPENDENT_AMBULATORY_CARE_PROVIDER_SITE_OTHER): Payer: Self-pay | Admitting: Vascular Surgery

## 2020-07-19 VITALS — BP 161/79 | HR 79 | Resp 16 | Wt 175.2 lb

## 2020-07-19 DIAGNOSIS — N186 End stage renal disease: Secondary | ICD-10-CM

## 2020-07-19 DIAGNOSIS — J449 Chronic obstructive pulmonary disease, unspecified: Secondary | ICD-10-CM

## 2020-07-19 DIAGNOSIS — I2510 Atherosclerotic heart disease of native coronary artery without angina pectoris: Secondary | ICD-10-CM

## 2020-07-19 DIAGNOSIS — T829XXD Unspecified complication of cardiac and vascular prosthetic device, implant and graft, subsequent encounter: Secondary | ICD-10-CM

## 2020-07-19 DIAGNOSIS — I70213 Atherosclerosis of native arteries of extremities with intermittent claudication, bilateral legs: Secondary | ICD-10-CM

## 2020-07-20 DIAGNOSIS — N186 End stage renal disease: Secondary | ICD-10-CM | POA: Diagnosis not present

## 2020-07-20 DIAGNOSIS — Z992 Dependence on renal dialysis: Secondary | ICD-10-CM | POA: Diagnosis not present

## 2020-07-20 DIAGNOSIS — T827XXA Infection and inflammatory reaction due to other cardiac and vascular devices, implants and grafts, initial encounter: Secondary | ICD-10-CM | POA: Diagnosis not present

## 2020-07-22 DIAGNOSIS — Z992 Dependence on renal dialysis: Secondary | ICD-10-CM | POA: Diagnosis not present

## 2020-07-22 DIAGNOSIS — N186 End stage renal disease: Secondary | ICD-10-CM | POA: Diagnosis not present

## 2020-07-23 DIAGNOSIS — G0491 Myelitis, unspecified: Secondary | ICD-10-CM | POA: Diagnosis not present

## 2020-07-23 DIAGNOSIS — N186 End stage renal disease: Secondary | ICD-10-CM | POA: Diagnosis not present

## 2020-07-23 DIAGNOSIS — Z992 Dependence on renal dialysis: Secondary | ICD-10-CM | POA: Diagnosis not present

## 2020-07-24 ENCOUNTER — Encounter: Payer: Self-pay | Admitting: Family

## 2020-07-24 ENCOUNTER — Other Ambulatory Visit: Payer: Self-pay

## 2020-07-24 ENCOUNTER — Ambulatory Visit: Payer: Medicare HMO | Attending: Family | Admitting: Family

## 2020-07-24 VITALS — BP 169/68 | HR 66 | Resp 16 | Ht 65.0 in | Wt 177.1 lb

## 2020-07-24 DIAGNOSIS — Z992 Dependence on renal dialysis: Secondary | ICD-10-CM | POA: Diagnosis not present

## 2020-07-24 DIAGNOSIS — I132 Hypertensive heart and chronic kidney disease with heart failure and with stage 5 chronic kidney disease, or end stage renal disease: Secondary | ICD-10-CM | POA: Insufficient documentation

## 2020-07-24 DIAGNOSIS — Y838 Other surgical procedures as the cause of abnormal reaction of the patient, or of later complication, without mention of misadventure at the time of the procedure: Secondary | ICD-10-CM | POA: Diagnosis not present

## 2020-07-24 DIAGNOSIS — Z79899 Other long term (current) drug therapy: Secondary | ICD-10-CM | POA: Insufficient documentation

## 2020-07-24 DIAGNOSIS — I252 Old myocardial infarction: Secondary | ICD-10-CM | POA: Diagnosis not present

## 2020-07-24 DIAGNOSIS — Z7982 Long term (current) use of aspirin: Secondary | ICD-10-CM | POA: Diagnosis not present

## 2020-07-24 DIAGNOSIS — N186 End stage renal disease: Secondary | ICD-10-CM | POA: Diagnosis not present

## 2020-07-24 DIAGNOSIS — T8249XA Other complication of vascular dialysis catheter, initial encounter: Secondary | ICD-10-CM | POA: Insufficient documentation

## 2020-07-24 DIAGNOSIS — Z955 Presence of coronary angioplasty implant and graft: Secondary | ICD-10-CM | POA: Insufficient documentation

## 2020-07-24 DIAGNOSIS — F419 Anxiety disorder, unspecified: Secondary | ICD-10-CM | POA: Insufficient documentation

## 2020-07-24 DIAGNOSIS — I5032 Chronic diastolic (congestive) heart failure: Secondary | ICD-10-CM

## 2020-07-24 DIAGNOSIS — I2510 Atherosclerotic heart disease of native coronary artery without angina pectoris: Secondary | ICD-10-CM | POA: Diagnosis not present

## 2020-07-24 DIAGNOSIS — Z8673 Personal history of transient ischemic attack (TIA), and cerebral infarction without residual deficits: Secondary | ICD-10-CM | POA: Insufficient documentation

## 2020-07-24 DIAGNOSIS — E1122 Type 2 diabetes mellitus with diabetic chronic kidney disease: Secondary | ICD-10-CM | POA: Diagnosis not present

## 2020-07-24 DIAGNOSIS — Z85528 Personal history of other malignant neoplasm of kidney: Secondary | ICD-10-CM | POA: Diagnosis not present

## 2020-07-24 DIAGNOSIS — Z888 Allergy status to other drugs, medicaments and biological substances status: Secondary | ICD-10-CM | POA: Insufficient documentation

## 2020-07-24 DIAGNOSIS — E1022 Type 1 diabetes mellitus with diabetic chronic kidney disease: Secondary | ICD-10-CM

## 2020-07-24 NOTE — Progress Notes (Signed)
Subjective:    Patient ID: [name], female    DOB: [birth year], 59 y.o.   MRN: [id number]  HPI [name] is a 59 year old african [place] female with a pertinent hx of Hypertension, ESRD on hemodialysis, TIA, and complication of dialysis fistula.   She presents today as a new patient after being admitted through the ER on 06/02/20 and discharged 06/25/20. Patient had a new peripheral vascular catheterization done on 06/19/20 but has been having complications with dialysis since in getting fluid off her body.   Patient has swelling in bilateral legs, abdomen, and arms at this time and reports pain with movement. No noted shortness of breath at this time. Patient reports chest pain and is sobbing due to her anxiety of the diagnosis of heart failure. Patient requested to call her son and hear his voice for moral support and was able to speak to him. Patient not easily redirected and is convinced that the new catheter for her dialysis that was placed is "pressing against her stent" in her heart not allowing her to be dialyzed appropriately.   Attempted to discuss her diagnosis with her but patient is currently fixated on stent placement and is facing the possibility of her own mortality. She is reflecting on her fears of not seeing her 86 year old grand-child grow up and leaving her family. She reports she has a strong faith and is leaning heavily on that at this time but is still too overwhelmed to appropriately process new information regarding her diagnosis.     Echo on 06/07/20 showed EF 60 to 65%.    Past Medical History:  Diagnosis Date  . Anemia   . Anginal pain ([place])   . Anxiety   . Arthritis   . Asthma   . Broken wrist   . Bronchitis   . chronic diastolic CHF 9/37/9024  . Chronic kidney disease   . COPD (chronic obstructive pulmonary disease) ([place])   . Coronary artery disease    a. cath 2013: stenting to RCA (report not available); b. cath 2014: LM nl, pLAD 40%, mLAD nl, ost LCx 40%,  mid LCx nl, pRCA 30% @ site of prior stent, mRCA 50%  . Depression   . Diabetes mellitus ([place])   . Diabetes mellitus without complication ([place])   . Diabetic neuropathy ([place])   . dialysis 2006  . Diverticulosis   . Dizziness   . Dyspnea   . Elevated lipids   . Environmental and seasonal allergies   . ESRD (end stage renal disease) on dialysis ([place])    M-W-F  . Gastroparesis   . GERD (gastroesophageal reflux disease)   . Headache   . History of anemia due to chronic kidney disease   . History of hiatal hernia   . HOH (hard of hearing)   . Hx of pancreatitis 2015  . Hypertension   . Lower extremity edema   . Mitral regurgitation    a. echo 10/2013: EF 62%, noWMA, mildly dilated LA, mild to mod MR/TR, GR1DD  . Myocardial infarction ([place])   . Orthopnea   . Parathyroid abnormality ([place])   . Peripheral arterial disease ([place])   . Pneumonia   . Renal cancer ([place])   . Renal insufficiency    Pt is on dialysis on M,W + F.  . Wheezing    Past Surgical History:  Procedure Laterality Date  . A/V SHUNTOGRAM Left 01/20/2018   Procedure: A/V SHUNTOGRAM;  Surgeon: Algernon Huxley, MD;  Location: [facility] [facility];  Service: Cardiovascular;  Laterality: Left;  . ABDOMINAL HYSTERECTOMY  1992  . AMPUTATION TOE Left 10/02/2017   Procedure: AMPUTATION TOE-LEFT GREAT TOE;  Surgeon: Albertine Patricia, DPM;  Location: [facility];  Service: Podiatry;  Laterality: Left;  . APPENDECTOMY    . APPLICATION OF WOUND VAC N/A 11/25/2019   Procedure: APPLICATION OF WOUND VAC;  Surgeon: Katha Cabal, MD;  Location: [facility];  Service: Vascular;  Laterality: N/A;  . ARTERY BIOPSY Right 10/11/2018   Procedure: BIOPSY TEMPORAL ARTERY;  Surgeon: Vickie Epley, MD;  Location: [facility];  Service: General;  Laterality: Right;  . CARDIAC CATHETERIZATION Left 07/26/2015   Procedure: Left Heart Cath and Coronary Angiography;  Surgeon: Dionisio David, MD;  Location: [facility];  Service: Cardiovascular;   Laterality: Left;  . CATARACT EXTRACTION W/ INTRAOCULAR LENS IMPLANT Right   . CATARACT EXTRACTION W/PHACO Left 03/10/2017   Procedure: CATARACT EXTRACTION PHACO AND INTRAOCULAR LENS PLACEMENT (IOC);  Surgeon: Birder Robson, MD;  Location: [facility];  Service: Ophthalmology;  Laterality: Left;  [place] 00:51.9 AP% 14.2 CDE 7.39 fluid pack lot # [id number]  . CENTRAL LINE INSERTION Right 11/11/2019   Procedure: CENTRAL LINE INSERTION;  Surgeon: Katha Cabal, MD;  Location: [facility];  Service: Vascular;  Laterality: Right;  . CENTRAL LINE INSERTION  11/25/2019   Procedure: CENTRAL LINE INSERTION;  Surgeon: Katha Cabal, MD;  Location: [facility];  Service: Vascular;;  . CHOLECYSTECTOMY    . COLONOSCOPY WITH PROPOFOL N/A 08/12/2016   Procedure: COLONOSCOPY WITH PROPOFOL;  Surgeon: Lollie Sails, MD;  Location: [facility];  Service: Endoscopy;  Laterality: N/A;  . DIALYSIS FISTULA CREATION Left    upper arm  . dialysis grafts    . DIALYSIS/PERMA CATHETER INSERTION N/A 11/14/2019   Procedure: DIALYSIS/PERMA CATHETER INSERTION;  Surgeon: Algernon Huxley, MD;  Location: [place] [facility];  Service: Cardiovascular;  Laterality: N/A;  . DIALYSIS/PERMA CATHETER INSERTION N/A 02/03/2020   Procedure: DIALYSIS/PERMA CATHETER INSERTION;  Surgeon: Katha Cabal, MD;  Location: [facility];  Service: Cardiovascular;  Laterality: N/A;  . DIALYSIS/PERMA CATHETER INSERTION N/A 06/19/2020   Procedure: DIALYSIS/PERMA CATHETER INSERTION;  Surgeon: Katha Cabal, MD;  Location: [facility] [facility];  Service: Cardiovascular;  Laterality: N/A;  . DIALYSIS/PERMA CATHETER REMOVAL N/A 05/25/2020   Procedure: DIALYSIS/PERMA CATHETER REMOVAL;  Surgeon: Katha Cabal, MD;  Location: [place] [facility];  Service: Cardiovascular;  Laterality: N/A;  . ESOPHAGOGASTRODUODENOSCOPY N/A 03/08/2015   Procedure: ESOPHAGOGASTRODUODENOSCOPY (EGD);  Surgeon: Manya Silvas, MD;  Location: [facility]  ENDOSCOPY;  Service: Endoscopy;  Laterality: N/A;  . ESOPHAGOGASTRODUODENOSCOPY (EGD) WITH PROPOFOL N/A 03/18/2016   Procedure: ESOPHAGOGASTRODUODENOSCOPY (EGD) WITH PROPOFOL;  Surgeon: [name], MD;  Location: [facility];  Service: Endoscopy;  Laterality: N/A;  . EYE SURGERY Right 2018  . FECAL TRANSPLANT N/A 08/23/2015   Procedure: FECAL TRANSPLANT;  Surgeon: Manya Silvas, MD;  Location: [facility] ENDOSCOPY;  Service: Endoscopy;  Laterality: N/A;  . HAND SURGERY Bilateral   . HEMATOMA EVACUATION Left 11/25/2019   Procedure: EVACUATION HEMATOMA;  Surgeon: Katha Cabal, MD;  Location: [facility];  Service: Vascular;  Laterality: Left;  . I & D EXTREMITY Left 11/25/2019   Procedure: IRRIGATION AND DEBRIDEMENT EXTREMITY;  Surgeon: Katha Cabal, MD;  Location: [facility];  Service: Vascular;  Laterality: Left;  . IR FLUORO GUIDE CV LINE RIGHT  04/06/2020  . IR RADIOLOGIST EVAL & MGMT  07/28/2019  . IR RADIOLOGIST EVAL & MGMT  08/11/2019  . LIGATION OF ARTERIOVENOUS  FISTULA Left 11/11/2019  . LIGATION OF ARTERIOVENOUS  FISTULA Left 11/11/2019   Procedure: LIGATION OF ARTERIOVENOUS  FISTULA;  Surgeon: Katha Cabal, MD;  Location: [facility];  Service: Vascular;  Laterality: Left;  . LIGATIONS OF HERO GRAFT Right 06/13/2020   Procedure: LIGATION / REMOVAL OF RIGHT HERO GRAFT;  Surgeon: Katha Cabal, MD;  Location: [facility];  Service: Vascular;  Laterality: Right;  . PERIPHERAL VASCULAR CATHETERIZATION N/A 12/20/2015   Procedure: Thrombectomy of dialysis access versus permcath placement;  Surgeon: Algernon Huxley, MD;  Location: [place] [facility];  Service: Cardiovascular;  Laterality: N/A;  . PERIPHERAL VASCULAR CATHETERIZATION N/A 12/20/2015   Procedure: A/V Shunt Intervention;  Surgeon: Algernon Huxley, MD;  Location: [facility] [facility];  Service: Cardiovascular;  Laterality: N/A;  . PERIPHERAL VASCULAR CATHETERIZATION N/A 12/20/2015   Procedure: A/V Shuntogram/Fistulagram;  Surgeon:  Algernon Huxley, MD;  Location: [facility];  Service: Cardiovascular;  Laterality: N/A;  . PERIPHERAL VASCULAR CATHETERIZATION N/A 01/02/2016   Procedure: A/V Shuntogram/Fistulagram;  Surgeon: Algernon Huxley, MD;  Location: [place] [facility];  Service: Cardiovascular;  Laterality: N/A;  . PERIPHERAL VASCULAR CATHETERIZATION N/A 01/02/2016   Procedure: A/V Shunt Intervention;  Surgeon: Algernon Huxley, MD;  Location: [place] [facility];  Service: Cardiovascular;  Laterality: N/A;  . TEE WITHOUT CARDIOVERSION N/A 06/11/2020   Procedure: TRANSESOPHAGEAL ECHOCARDIOGRAM (TEE);  Surgeon: Teodoro Spray, MD;  Location: [facility];  Service: Cardiovascular;  Laterality: N/A;  . UPPER EXTREMITY VENOGRAPHY Right 01/18/2020   Procedure: UPPER EXTREMITY VENOGRAPHY;  Surgeon: Katha Cabal, MD;  Location: [facility] [facility];  Service: Cardiovascular;  Laterality: Right;  Marland Kitchen VASCULAR ACCESS DEVICE INSERTION Right 04/13/2020   Procedure: INSERTION OF HERO VASCULAR ACCESS DEVICE (GRAFT);  Surgeon: Katha Cabal, MD;  Location: [facility];  Service: Vascular;  Laterality: Right;   Family History  Problem Relation Age of Onset  . Kidney disease Mother   . Diabetes Mother   . Cancer Father   . Kidney disease Sister    Social History   Tobacco Use  . Smoking status: Never Smoker  . Smokeless tobacco: Never Used  Substance Use Topics  . Alcohol use: Not Currently    Comment: glass wine week per pt   Allergies  Allergen Reactions  . Ace Inhibitors Swelling and Anaphylaxis  . Ativan [Lorazepam] Other (See Comments)    Reaction:Hallucinations and headaches  . Compazine [Prochlorperazine Edisylate] Anaphylaxis, Nausea And Vomiting and Other (See Comments)    Other reaction(s): dystonia from this vs. Reglan, 23 Jul - patient relates that she takes promethazine frequently with no problems  . Sumatriptan Succinate Other (See Comments)    Other reaction(s): delirium and hallucinations per [facility]  records  . Zofran [Ondansetron] Nausea And Vomiting    Per pt. she is allergic to zofran or will experience adverse reaction like hallucination   . Losartan Nausea Only  . Prochlorperazine Other (See Comments)    Reaction:  Unknown . Patient does not remember reaction but she does have vertigo and anxiety along with n and v at times. Could be used to treat any of these   . Reglan [Metoclopramide] Other (See Comments)    Per patient her Dr. Evelina Bucy her off it   . Scopolamine Other (See Comments)    Dizziness, also has vertigo already  . Tape Rash  Plastic tape causes rash  . Tapentadol Rash   Prior to Admission medications   Medication Sig Start Date End Date Taking? Authorizing Provider  albuterol (PROVENTIL) (2.5 MG/3ML) 0.083% nebulizer solution Take 2.5 mg by nebulization every 4 (four) hours as needed for wheezing or shortness of breath.   Yes [provider]  albuterol (VENTOLIN HFA) 108 (90 Base) MCG/ACT inhaler Inhale 2 puffs into the lungs every 4 (four) hours as needed for wheezing or shortness of breath. 11/09/19  Yes [provider]  alum & mag hydroxide-simeth (MAALOX/MYLANTA) 200-200-20 MG/5ML suspension Take 15 mLs by mouth every 6 (six) hours as needed for indigestion or heartburn. 06/25/20  Yes Enzo Bi, MD  amLODipine (NORVASC) 10 MG tablet Take 1 tablet (10 mg total) by mouth daily. 06/25/20  Yes Enzo Bi, MD  ammonium lactate (LAC-HYDRIN) 12 % lotion Apply 1 application topically 2 (two) times daily as needed for dry skin.    Yes [provider]  Ascorbic Acid (VITA-C PO) Take 1 tablet by mouth daily.   Yes [provider]  aspirin EC 81 MG tablet Take 81 mg by mouth daily.   Yes [provider]  atorvastatin (LIPITOR) 20 MG tablet Take 20 mg by mouth every evening.  10/24/19  Yes [provider]  calcium carbonate (TUMS - DOSED IN MG ELEMENTAL CALCIUM) 500 MG chewable tablet Chew 2.5 tablets (500 mg of elemental calcium  total) by mouth every 6 (six) hours as needed for indigestion. 06/25/20  Yes Enzo Bi, MD  carvedilol (COREG) 6.25 MG tablet Take 6.25 mg by mouth 2 (two) times daily with a meal.   Yes [provider]  Cholecalciferol (VITAMIN D3) 25 MCG (1000 UT) CAPS Take 1,000 Units by mouth daily.    Yes [provider]  cholestyramine light (PREVALITE) 4 g packet Take 4 g by mouth 2 (two) times daily.   Yes [provider]  CREON 12000 units CPEP capsule Take 12,000-36,000 Units by mouth See admin instructions. Take 3 capsules (36,000u) by mouth three times daily with meals and take 1 capsule (12,000u) by mouth daily with a snack 10/21/19  Yes [provider]  cyanocobalamin 1000 MCG tablet Take 1,000 mcg by mouth daily.    Yes [provider]  diphenhydrAMINE (BENADRYL) 25 MG tablet Take 25 mg by mouth every 6 (six) hours as needed for itching.   Yes [provider]  epoetin alfa (EPOGEN) 10000 UNIT/ML injection Inject 1 mL (10,000 Units total) into the vein every Monday, Wednesday, and Friday with hemodialysis. 06/27/20  Yes Enzo Bi, MD  feeding supplement, ENSURE ENLIVE, (ENSURE ENLIVE) LIQD Take 237 mLs by mouth daily. 06/26/20  Yes Enzo Bi, MD  Ferrous Sulfate (IRON) 325 (65 Fe) MG TABS Take 325 mg by mouth daily.    Yes [provider]  furosemide (LASIX) 80 MG tablet Take 80 mg by mouth daily. 05/08/20  Yes [provider]  gabapentin (NEURONTIN) 100 MG capsule Take 100 mg by mouth every evening.    Yes [provider]  hydrALAZINE (APRESOLINE) 50 MG tablet Take 1 tablet (50 mg total) by mouth every 8 (eight) hours. 06/25/20  Yes Enzo Bi, MD  hydrOXYzine (ATARAX/VISTARIL) 25 MG tablet Take 25 mg by mouth daily.    Yes [provider]  multivitamin (RENA-VIT) TABS tablet Take 1 tablet by mouth daily.    Yes [provider]  mupirocin ointment (BACTROBAN) 2 % Apply 1 application topically daily as needed (leg  rash).  11/09/19  Yes [provider]  nitroGLYCERIN (NITROSTAT) 0.4 MG SL tablet Place 0.4 mg under the tongue every 5 (five) minutes as needed for chest pain.    Yes [provider]  Omega-3 300 MG CAPS Take 300 mg by mouth 2 (two) times daily. Every other day opposite omega XL   Yes [provider]  pantoprazole (PROTONIX) 40 MG tablet Take 40 mg by mouth daily. 08/28/19  Yes [provider]  polyethylene glycol (MIRALAX / GLYCOLAX) 17 g packet Take 17 g by mouth daily. 06/25/20  Yes Enzo Bi, MD  Probiotic Product (PROBIOTIC PO) Take 1 capsule by mouth daily.   Yes [provider]  promethazine (PHENERGAN) 25 MG tablet Take 1 tablet (25 mg total) by mouth every 6 (six) hours as needed for nausea or vomiting. 06/25/20  Yes Enzo Bi, MD  sevelamer carbonate (RENVELA) 800 MG tablet Take 800 mg by mouth 4 (four) times daily. (at meals and with a snack)   Yes [provider]  traMADol (ULTRAM) 50 MG tablet Take 1 tablet (50 mg total) by mouth every 8 (eight) hours as needed for moderate pain or severe pain. 06/25/20  Yes Enzo Bi, MD    Review of Systems  Constitutional: Positive for activity change, appetite change and fatigue.  HENT: Negative.   Eyes: Negative.   Respiratory: Positive for chest tightness and shortness of breath. Negative for cough, choking, wheezing and stridor.   Cardiovascular: Positive for chest pain, palpitations and leg swelling.  Gastrointestinal: Positive for abdominal distention, abdominal pain, constipation and nausea.  Endocrine: Negative.   Genitourinary: Positive for difficulty urinating.  Skin: Negative.   Allergic/Immunologic: Positive for immunocompromised state.  Neurological: Positive for dizziness, weakness and light-headedness. Negative for tremors, seizures, syncope, speech difficulty and numbness.  Psychiatric/Behavioral: Positive for agitation. Negative for confusion, self-injury and suicidal ideas. The  patient is nervous/anxious.        Objective:   Physical Exam Constitutional:      Appearance: She is ill-appearing. She is not toxic-appearing or diaphoretic.  Pulmonary:     Effort: Pulmonary effort is normal. No tachypnea or respiratory distress.     Breath sounds: No stridor. No wheezing or rales.  Abdominal:     General: There is distension. There are no signs of injury.     Tenderness: There is abdominal tenderness (Due to distension).  Musculoskeletal:     Right lower leg: Tenderness present. Edema (+3 pitting edema bilaterally) present.     Left lower leg: Tenderness present. Edema present.  Skin:    General: Skin is dry.     Coloration: Skin is pale. Skin is not cyanotic.     Findings: No erythema or rash.  Neurological:     Mental Status: She is alert.    Vitals:   07/24/20 1455  BP: (!) 169/68  Pulse: 66  Resp: 16  SpO2: 100%  Weight: 177 lb 2 oz (80.3 kg)  Height: 5\' 5"  (1.651 m)   Wt Readings from Last 3 Encounters:  07/24/20 177 lb 2 oz (80.3 kg)  07/19/20 175 lb 3.2 oz (79.5 kg)  06/25/20 181 lb 14.4 oz (82.5 kg)    Lab Results  Component Value Date   CREATININE 4.82 (H) 06/25/2020   CREATININE 4.44 (H) 06/22/2020   CREATININE 3.49 (H) 06/21/2020      Assessment & Plan:   1: Chronic Heart Failure with preserved ejection fraction: - NYHA Class III - Fluid overloaded today, but  believed to be related to dialysis complications - Patient does not seem to watch her diet or sodium closely based on her account of "making a big pot of chicken and dumplings this morning" -Unable to assess other factors potentially affecting heart failure due to her mental state - BMP on 06/25/20 reviewed: Sodium 132, K+ 3.9, Creatinine 4.82, GFR 11 - BNP 05/22/20 was 620.7  2: ESRD/Dialysis Catheter complications: -Saw Vascular Surgeon Delana Meyer) 07/19/20 - encouraged her to call him to discuss pain surrounding her port  3: Diabetes: -A1c 5.7 on 05/03/20 -Checks  glucose regularly -Sees PCP on Thursday Posey Pronto) at [facility].  -Patient would potentially benefit from Palliative care consult and Anxiety/Depression counseling based on visit today. Patient was difficult to console or redirect in regards to her diagnosis and reports multiple social and familial difficulties. Patient anxious throughout visit. Will message PCP regarding potential thoughts on Counseling for patient in regards to overwhelming fears and emotional support.   Follow up PRN. Patient given information to call clinic if she needs to be seen.

## 2020-07-24 NOTE — Patient Instructions (Addendum)
Return to [place] in the future if you'd like to schedule another appointment.    Call Dr. Delana Meyer and let him know about the pain around your port.

## 2020-07-25 ENCOUNTER — Telehealth (INDEPENDENT_AMBULATORY_CARE_PROVIDER_SITE_OTHER): Payer: Self-pay

## 2020-07-25 DIAGNOSIS — G0491 Myelitis, unspecified: Secondary | ICD-10-CM | POA: Diagnosis not present

## 2020-07-25 DIAGNOSIS — Z992 Dependence on renal dialysis: Secondary | ICD-10-CM | POA: Diagnosis not present

## 2020-07-25 DIAGNOSIS — N186 End stage renal disease: Secondary | ICD-10-CM | POA: Diagnosis not present

## 2020-07-25 NOTE — Telephone Encounter (Signed)
I spoke with the NP that saw her directly.  She did not state that the stent in her heart was rubbing against her port.  It is actually not possible.  She stated to talk to [place] because her port hurts.  The port location has absolutely nothing to do with her development of heart failure. Unfortunately do to the location, you will have some discomfort.  We will proceed with Dr. Nino Parsley plan and see her back as scheduled

## 2020-07-25 NOTE — Telephone Encounter (Signed)
I called the pt and made her aware of the NP's instructions. 

## 2020-07-25 NOTE — Telephone Encounter (Signed)
Pt called and left a message on the VM saying she needs to be seen per cardiology. I returned the pt's call and she said that her stent in her heart  was rubbing against her heart and she cannot wait two moths to be seen per Dr. Delana Meyer. Please advise.

## 2020-07-26 DIAGNOSIS — I10 Essential (primary) hypertension: Secondary | ICD-10-CM | POA: Diagnosis not present

## 2020-07-26 DIAGNOSIS — N186 End stage renal disease: Secondary | ICD-10-CM | POA: Diagnosis not present

## 2020-07-26 DIAGNOSIS — G894 Chronic pain syndrome: Secondary | ICD-10-CM | POA: Diagnosis not present

## 2020-07-26 DIAGNOSIS — Z0000 Encounter for general adult medical examination without abnormal findings: Secondary | ICD-10-CM | POA: Diagnosis not present

## 2020-07-26 DIAGNOSIS — Z23 Encounter for immunization: Secondary | ICD-10-CM | POA: Diagnosis not present

## 2020-07-27 DIAGNOSIS — G0491 Myelitis, unspecified: Secondary | ICD-10-CM | POA: Diagnosis not present

## 2020-07-27 DIAGNOSIS — Z79899 Other long term (current) drug therapy: Secondary | ICD-10-CM | POA: Diagnosis not present

## 2020-07-27 DIAGNOSIS — Z992 Dependence on renal dialysis: Secondary | ICD-10-CM | POA: Diagnosis not present

## 2020-07-27 DIAGNOSIS — N186 End stage renal disease: Secondary | ICD-10-CM | POA: Diagnosis not present

## 2020-07-30 DIAGNOSIS — G0491 Myelitis, unspecified: Secondary | ICD-10-CM | POA: Diagnosis not present

## 2020-07-30 DIAGNOSIS — N186 End stage renal disease: Secondary | ICD-10-CM | POA: Diagnosis not present

## 2020-07-30 DIAGNOSIS — Z79899 Other long term (current) drug therapy: Secondary | ICD-10-CM | POA: Diagnosis not present

## 2020-07-30 DIAGNOSIS — Z992 Dependence on renal dialysis: Secondary | ICD-10-CM | POA: Diagnosis not present

## 2020-08-01 DIAGNOSIS — Z992 Dependence on renal dialysis: Secondary | ICD-10-CM | POA: Diagnosis not present

## 2020-08-01 DIAGNOSIS — G0491 Myelitis, unspecified: Secondary | ICD-10-CM | POA: Diagnosis not present

## 2020-08-01 DIAGNOSIS — N186 End stage renal disease: Secondary | ICD-10-CM | POA: Diagnosis not present

## 2020-08-02 DIAGNOSIS — I5032 Chronic diastolic (congestive) heart failure: Secondary | ICD-10-CM | POA: Diagnosis not present

## 2020-08-02 DIAGNOSIS — R1311 Dysphagia, oral phase: Secondary | ICD-10-CM | POA: Diagnosis not present

## 2020-08-02 DIAGNOSIS — J449 Chronic obstructive pulmonary disease, unspecified: Secondary | ICD-10-CM | POA: Diagnosis not present

## 2020-08-02 DIAGNOSIS — E1151 Type 2 diabetes mellitus with diabetic peripheral angiopathy without gangrene: Secondary | ICD-10-CM | POA: Diagnosis not present

## 2020-08-02 DIAGNOSIS — E43 Unspecified severe protein-calorie malnutrition: Secondary | ICD-10-CM | POA: Diagnosis not present

## 2020-08-02 DIAGNOSIS — E1140 Type 2 diabetes mellitus with diabetic neuropathy, unspecified: Secondary | ICD-10-CM | POA: Diagnosis not present

## 2020-08-02 DIAGNOSIS — N186 End stage renal disease: Secondary | ICD-10-CM | POA: Diagnosis not present

## 2020-08-02 DIAGNOSIS — E1122 Type 2 diabetes mellitus with diabetic chronic kidney disease: Secondary | ICD-10-CM | POA: Diagnosis not present

## 2020-08-02 DIAGNOSIS — I132 Hypertensive heart and chronic kidney disease with heart failure and with stage 5 chronic kidney disease, or end stage renal disease: Secondary | ICD-10-CM | POA: Diagnosis not present

## 2020-08-03 DIAGNOSIS — G0491 Myelitis, unspecified: Secondary | ICD-10-CM | POA: Diagnosis not present

## 2020-08-03 DIAGNOSIS — N186 End stage renal disease: Secondary | ICD-10-CM | POA: Diagnosis not present

## 2020-08-03 DIAGNOSIS — Z992 Dependence on renal dialysis: Secondary | ICD-10-CM | POA: Diagnosis not present

## 2020-08-04 ENCOUNTER — Encounter (INDEPENDENT_AMBULATORY_CARE_PROVIDER_SITE_OTHER): Payer: Self-pay | Admitting: Vascular Surgery

## 2020-08-06 DIAGNOSIS — G0491 Myelitis, unspecified: Secondary | ICD-10-CM | POA: Diagnosis not present

## 2020-08-06 DIAGNOSIS — Z992 Dependence on renal dialysis: Secondary | ICD-10-CM | POA: Diagnosis not present

## 2020-08-06 DIAGNOSIS — N186 End stage renal disease: Secondary | ICD-10-CM | POA: Diagnosis not present

## 2020-08-08 DIAGNOSIS — G0491 Myelitis, unspecified: Secondary | ICD-10-CM | POA: Diagnosis not present

## 2020-08-08 DIAGNOSIS — Z79899 Other long term (current) drug therapy: Secondary | ICD-10-CM | POA: Diagnosis not present

## 2020-08-08 DIAGNOSIS — N186 End stage renal disease: Secondary | ICD-10-CM | POA: Diagnosis not present

## 2020-08-08 DIAGNOSIS — Z992 Dependence on renal dialysis: Secondary | ICD-10-CM | POA: Diagnosis not present

## 2020-08-09 DIAGNOSIS — E43 Unspecified severe protein-calorie malnutrition: Secondary | ICD-10-CM | POA: Diagnosis not present

## 2020-08-09 DIAGNOSIS — E1140 Type 2 diabetes mellitus with diabetic neuropathy, unspecified: Secondary | ICD-10-CM | POA: Diagnosis not present

## 2020-08-09 DIAGNOSIS — I132 Hypertensive heart and chronic kidney disease with heart failure and with stage 5 chronic kidney disease, or end stage renal disease: Secondary | ICD-10-CM | POA: Diagnosis not present

## 2020-08-09 DIAGNOSIS — N186 End stage renal disease: Secondary | ICD-10-CM | POA: Diagnosis not present

## 2020-08-09 DIAGNOSIS — R1311 Dysphagia, oral phase: Secondary | ICD-10-CM | POA: Diagnosis not present

## 2020-08-09 DIAGNOSIS — I5032 Chronic diastolic (congestive) heart failure: Secondary | ICD-10-CM | POA: Diagnosis not present

## 2020-08-09 DIAGNOSIS — E1122 Type 2 diabetes mellitus with diabetic chronic kidney disease: Secondary | ICD-10-CM | POA: Diagnosis not present

## 2020-08-09 DIAGNOSIS — E1151 Type 2 diabetes mellitus with diabetic peripheral angiopathy without gangrene: Secondary | ICD-10-CM | POA: Diagnosis not present

## 2020-08-09 DIAGNOSIS — J449 Chronic obstructive pulmonary disease, unspecified: Secondary | ICD-10-CM | POA: Diagnosis not present

## 2020-08-10 ENCOUNTER — Other Ambulatory Visit: Payer: Self-pay

## 2020-08-10 ENCOUNTER — Observation Stay: Payer: Medicare HMO

## 2020-08-10 ENCOUNTER — Emergency Department: Payer: Medicare HMO

## 2020-08-10 ENCOUNTER — Inpatient Hospital Stay
Admission: EM | Admit: 2020-08-10 | Discharge: 2020-08-17 | DRG: 291 | Disposition: A | Discharge status: Home Health | Payer: Medicare HMO | Attending: Internal Medicine | Admitting: Internal Medicine

## 2020-08-10 DIAGNOSIS — I2510 Atherosclerotic heart disease of native coronary artery without angina pectoris: Secondary | ICD-10-CM | POA: Diagnosis present

## 2020-08-10 DIAGNOSIS — I5030 Unspecified diastolic (congestive) heart failure: Secondary | ICD-10-CM | POA: Diagnosis not present

## 2020-08-10 DIAGNOSIS — Z8619 Personal history of other infectious and parasitic diseases: Secondary | ICD-10-CM

## 2020-08-10 DIAGNOSIS — E785 Hyperlipidemia, unspecified: Secondary | ICD-10-CM | POA: Diagnosis present

## 2020-08-10 DIAGNOSIS — J9601 Acute respiratory failure with hypoxia: Secondary | ICD-10-CM | POA: Diagnosis present

## 2020-08-10 DIAGNOSIS — I82409 Acute embolism and thrombosis of unspecified deep veins of unspecified lower extremity: Secondary | ICD-10-CM

## 2020-08-10 DIAGNOSIS — Z91048 Other nonmedicinal substance allergy status: Secondary | ICD-10-CM

## 2020-08-10 DIAGNOSIS — Z87891 Personal history of nicotine dependence: Secondary | ICD-10-CM

## 2020-08-10 DIAGNOSIS — D638 Anemia in other chronic diseases classified elsewhere: Secondary | ICD-10-CM | POA: Diagnosis not present

## 2020-08-10 DIAGNOSIS — R079 Chest pain, unspecified: Secondary | ICD-10-CM | POA: Diagnosis not present

## 2020-08-10 DIAGNOSIS — R0600 Dyspnea, unspecified: Secondary | ICD-10-CM | POA: Diagnosis not present

## 2020-08-10 DIAGNOSIS — R0902 Hypoxemia: Secondary | ICD-10-CM

## 2020-08-10 DIAGNOSIS — E871 Hypo-osmolality and hyponatremia: Secondary | ICD-10-CM | POA: Diagnosis not present

## 2020-08-10 DIAGNOSIS — Z8673 Personal history of transient ischemic attack (TIA), and cerebral infarction without residual deficits: Secondary | ICD-10-CM | POA: Diagnosis not present

## 2020-08-10 DIAGNOSIS — K529 Noninfective gastroenteritis and colitis, unspecified: Secondary | ICD-10-CM | POA: Diagnosis present

## 2020-08-10 DIAGNOSIS — G459 Transient cerebral ischemic attack, unspecified: Secondary | ICD-10-CM | POA: Diagnosis not present

## 2020-08-10 DIAGNOSIS — M4624 Osteomyelitis of vertebra, thoracic region: Secondary | ICD-10-CM | POA: Diagnosis present

## 2020-08-10 DIAGNOSIS — E162 Hypoglycemia, unspecified: Secondary | ICD-10-CM | POA: Diagnosis not present

## 2020-08-10 DIAGNOSIS — R0602 Shortness of breath: Secondary | ICD-10-CM | POA: Diagnosis not present

## 2020-08-10 DIAGNOSIS — E8770 Fluid overload, unspecified: Secondary | ICD-10-CM | POA: Diagnosis not present

## 2020-08-10 DIAGNOSIS — R001 Bradycardia, unspecified: Secondary | ICD-10-CM | POA: Diagnosis present

## 2020-08-10 DIAGNOSIS — R112 Nausea with vomiting, unspecified: Secondary | ICD-10-CM | POA: Diagnosis not present

## 2020-08-10 DIAGNOSIS — M4644 Discitis, unspecified, thoracic region: Secondary | ICD-10-CM | POA: Diagnosis not present

## 2020-08-10 DIAGNOSIS — E119 Type 2 diabetes mellitus without complications: Secondary | ICD-10-CM

## 2020-08-10 DIAGNOSIS — E11649 Type 2 diabetes mellitus with hypoglycemia without coma: Secondary | ICD-10-CM | POA: Diagnosis not present

## 2020-08-10 DIAGNOSIS — J449 Chronic obstructive pulmonary disease, unspecified: Secondary | ICD-10-CM | POA: Diagnosis present

## 2020-08-10 DIAGNOSIS — I501 Left ventricular failure: Secondary | ICD-10-CM | POA: Diagnosis not present

## 2020-08-10 DIAGNOSIS — K861 Other chronic pancreatitis: Secondary | ICD-10-CM | POA: Diagnosis present

## 2020-08-10 DIAGNOSIS — E1169 Type 2 diabetes mellitus with other specified complication: Secondary | ICD-10-CM | POA: Diagnosis present

## 2020-08-10 DIAGNOSIS — M8610 Other acute osteomyelitis, unspecified site: Secondary | ICD-10-CM

## 2020-08-10 DIAGNOSIS — Z7189 Other specified counseling: Secondary | ICD-10-CM | POA: Diagnosis not present

## 2020-08-10 DIAGNOSIS — I248 Other forms of acute ischemic heart disease: Secondary | ICD-10-CM | POA: Diagnosis present

## 2020-08-10 DIAGNOSIS — E1143 Type 2 diabetes mellitus with diabetic autonomic (poly)neuropathy: Secondary | ICD-10-CM | POA: Diagnosis present

## 2020-08-10 DIAGNOSIS — F32A Depression, unspecified: Secondary | ICD-10-CM | POA: Diagnosis present

## 2020-08-10 DIAGNOSIS — N2581 Secondary hyperparathyroidism of renal origin: Secondary | ICD-10-CM | POA: Diagnosis present

## 2020-08-10 DIAGNOSIS — N186 End stage renal disease: Secondary | ICD-10-CM

## 2020-08-10 DIAGNOSIS — I252 Old myocardial infarction: Secondary | ICD-10-CM

## 2020-08-10 DIAGNOSIS — G4489 Other headache syndrome: Secondary | ICD-10-CM | POA: Diagnosis not present

## 2020-08-10 DIAGNOSIS — M1990 Unspecified osteoarthritis, unspecified site: Secondary | ICD-10-CM | POA: Diagnosis present

## 2020-08-10 DIAGNOSIS — F419 Anxiety disorder, unspecified: Secondary | ICD-10-CM | POA: Diagnosis present

## 2020-08-10 DIAGNOSIS — Z89412 Acquired absence of left great toe: Secondary | ICD-10-CM

## 2020-08-10 DIAGNOSIS — E1141 Type 2 diabetes mellitus with diabetic mononeuropathy: Secondary | ICD-10-CM | POA: Diagnosis present

## 2020-08-10 DIAGNOSIS — K2970 Gastritis, unspecified, without bleeding: Secondary | ICD-10-CM | POA: Diagnosis present

## 2020-08-10 DIAGNOSIS — I132 Hypertensive heart and chronic kidney disease with heart failure and with stage 5 chronic kidney disease, or end stage renal disease: Secondary | ICD-10-CM | POA: Diagnosis present

## 2020-08-10 DIAGNOSIS — Z992 Dependence on renal dialysis: Secondary | ICD-10-CM

## 2020-08-10 DIAGNOSIS — Z961 Presence of intraocular lens: Secondary | ICD-10-CM | POA: Diagnosis present

## 2020-08-10 DIAGNOSIS — E876 Hypokalemia: Secondary | ICD-10-CM

## 2020-08-10 DIAGNOSIS — E1122 Type 2 diabetes mellitus with diabetic chronic kidney disease: Secondary | ICD-10-CM | POA: Diagnosis present

## 2020-08-10 DIAGNOSIS — M899 Disorder of bone, unspecified: Secondary | ICD-10-CM | POA: Diagnosis present

## 2020-08-10 DIAGNOSIS — K219 Gastro-esophageal reflux disease without esophagitis: Secondary | ICD-10-CM | POA: Diagnosis not present

## 2020-08-10 DIAGNOSIS — I10 Essential (primary) hypertension: Secondary | ICD-10-CM

## 2020-08-10 DIAGNOSIS — J810 Acute pulmonary edema: Secondary | ICD-10-CM | POA: Diagnosis present

## 2020-08-10 DIAGNOSIS — Z833 Family history of diabetes mellitus: Secondary | ICD-10-CM

## 2020-08-10 DIAGNOSIS — Z841 Family history of disorders of kidney and ureter: Secondary | ICD-10-CM

## 2020-08-10 DIAGNOSIS — D631 Anemia in chronic kidney disease: Secondary | ICD-10-CM

## 2020-08-10 DIAGNOSIS — M869 Osteomyelitis, unspecified: Secondary | ICD-10-CM

## 2020-08-10 DIAGNOSIS — M25641 Stiffness of right hand, not elsewhere classified: Secondary | ICD-10-CM | POA: Diagnosis not present

## 2020-08-10 DIAGNOSIS — D649 Anemia, unspecified: Secondary | ICD-10-CM | POA: Diagnosis not present

## 2020-08-10 DIAGNOSIS — R0789 Other chest pain: Secondary | ICD-10-CM | POA: Diagnosis not present

## 2020-08-10 DIAGNOSIS — D472 Monoclonal gammopathy: Secondary | ICD-10-CM | POA: Diagnosis present

## 2020-08-10 DIAGNOSIS — I5033 Acute on chronic diastolic (congestive) heart failure: Secondary | ICD-10-CM | POA: Diagnosis present

## 2020-08-10 DIAGNOSIS — Z7982 Long term (current) use of aspirin: Secondary | ICD-10-CM

## 2020-08-10 DIAGNOSIS — D696 Thrombocytopenia, unspecified: Secondary | ICD-10-CM | POA: Diagnosis present

## 2020-08-10 DIAGNOSIS — Z888 Allergy status to other drugs, medicaments and biological substances status: Secondary | ICD-10-CM

## 2020-08-10 DIAGNOSIS — I120 Hypertensive chronic kidney disease with stage 5 chronic kidney disease or end stage renal disease: Secondary | ICD-10-CM | POA: Diagnosis not present

## 2020-08-10 DIAGNOSIS — Z79899 Other long term (current) drug therapy: Secondary | ICD-10-CM

## 2020-08-10 DIAGNOSIS — Z20822 Contact with and (suspected) exposure to covid-19: Secondary | ICD-10-CM | POA: Diagnosis present

## 2020-08-10 DIAGNOSIS — E1151 Type 2 diabetes mellitus with diabetic peripheral angiopathy without gangrene: Secondary | ICD-10-CM | POA: Diagnosis present

## 2020-08-10 DIAGNOSIS — H9190 Unspecified hearing loss, unspecified ear: Secondary | ICD-10-CM | POA: Diagnosis present

## 2020-08-10 DIAGNOSIS — I509 Heart failure, unspecified: Secondary | ICD-10-CM | POA: Diagnosis not present

## 2020-08-10 DIAGNOSIS — M25669 Stiffness of unspecified knee, not elsewhere classified: Secondary | ICD-10-CM | POA: Diagnosis not present

## 2020-08-10 DIAGNOSIS — Z85528 Personal history of other malignant neoplasm of kidney: Secondary | ICD-10-CM

## 2020-08-10 DIAGNOSIS — M4804 Spinal stenosis, thoracic region: Secondary | ICD-10-CM | POA: Diagnosis not present

## 2020-08-10 DIAGNOSIS — G8929 Other chronic pain: Secondary | ICD-10-CM | POA: Diagnosis present

## 2020-08-10 LAB — COMPREHENSIVE METABOLIC PANEL
ALT: 10 U/L (ref 0–44)
AST: 17 U/L (ref 15–41)
Albumin: 2.4 g/dL — ABNORMAL LOW (ref 3.5–5.0)
Alkaline Phosphatase: 68 U/L (ref 38–126)
Anion gap: 12 (ref 5–15)
BUN: 8 mg/dL (ref 8–23)
CO2: 28 mmol/L (ref 22–32)
Calcium: 7.7 mg/dL — ABNORMAL LOW (ref 8.9–10.3)
Chloride: 99 mmol/L (ref 98–111)
Creatinine, Ser: 3.74 mg/dL — ABNORMAL HIGH (ref 0.44–1.00)
GFR, Estimated: 13 mL/min — ABNORMAL LOW (ref >60)
Glucose, Bld: 46 mg/dL — ABNORMAL LOW (ref 70–99)
Potassium: 2.9 mmol/L — ABNORMAL LOW (ref 3.5–5.1)
Sodium: 139 mmol/L (ref 135–145)
Total Bilirubin: 0.5 mg/dL (ref 0.3–1.2)
Total Protein: 6.1 g/dL — ABNORMAL LOW (ref 6.5–8.1)

## 2020-08-10 LAB — TROPONIN I (HIGH SENSITIVITY)
Troponin I (High Sensitivity): 8 ng/L (ref <18)
Troponin I (High Sensitivity): 9 ng/L (ref <18)
Troponin I (High Sensitivity): 9 ng/L (ref <18)

## 2020-08-10 LAB — CBC
HCT: 22.5 % — ABNORMAL LOW (ref 36.0–46.0)
Hemoglobin: 7.4 g/dL — ABNORMAL LOW (ref 12.0–15.0)
MCH: 28.9 pg (ref 26.0–34.0)
MCHC: 32.9 g/dL (ref 30.0–36.0)
MCV: 87.9 fL (ref 80.0–100.0)
Platelets: 146 10*3/uL — ABNORMAL LOW (ref 150–400)
RBC: 2.56 MIL/uL — ABNORMAL LOW (ref 3.87–5.11)
RDW: 20 % — ABNORMAL HIGH (ref 11.5–15.5)
WBC: 4.3 10*3/uL (ref 4.0–10.5)
nRBC: 0 % (ref 0.0–0.2)

## 2020-08-10 LAB — RESP PANEL BY RT-PCR (FLU A&B, COVID) ARPGX2
Influenza A by PCR: NEGATIVE
Influenza B by PCR: NEGATIVE
SARS Coronavirus 2 by RT PCR: NEGATIVE

## 2020-08-10 LAB — MAGNESIUM: Magnesium: 1.8 mg/dL (ref 1.7–2.4)

## 2020-08-10 LAB — MRSA PCR SCREENING: MRSA by PCR: NEGATIVE

## 2020-08-10 MED ORDER — SACCHAROMYCES BOULARDII 250 MG PO CAPS
250.0000 mg | ORAL_CAPSULE | Freq: Every day | ORAL | Status: DC
  Administered 2020-08-10 – 2020-08-17 (×6): 250 mg via ORAL
  Filled 2020-08-10 (×9): qty 1

## 2020-08-10 MED ORDER — EPOETIN ALFA 4000 UNIT/ML IJ SOLN
4000.0000 [IU] | INTRAMUSCULAR | Status: DC
  Filled 2020-08-10: qty 1

## 2020-08-10 MED ORDER — HEPARIN SODIUM (PORCINE) 1000 UNIT/ML DIALYSIS
1000.0000 [IU] | INTRAMUSCULAR | Status: DC | PRN
  Filled 2020-08-10: qty 1

## 2020-08-10 MED ORDER — POTASSIUM CHLORIDE CRYS ER 20 MEQ PO TBCR
40.0000 meq | EXTENDED_RELEASE_TABLET | ORAL | Status: DC
  Filled 2020-08-10: qty 2

## 2020-08-10 MED ORDER — MORPHINE SULFATE (PF) 2 MG/ML IV SOLN
2.0000 mg | INTRAVENOUS | Status: DC | PRN
  Administered 2020-08-10 – 2020-08-17 (×13): 2 mg via INTRAVENOUS
  Filled 2020-08-10 (×15): qty 1

## 2020-08-10 MED ORDER — AMLODIPINE BESYLATE 10 MG PO TABS
10.0000 mg | ORAL_TABLET | Freq: Every day | ORAL | Status: DC
  Administered 2020-08-10 – 2020-08-17 (×6): 10 mg via ORAL
  Filled 2020-08-10 (×8): qty 1

## 2020-08-10 MED ORDER — ALTEPLASE 2 MG IJ SOLR
2.0000 mg | Freq: Once | INTRAMUSCULAR | Status: DC | PRN
  Filled 2020-08-10: qty 2

## 2020-08-10 MED ORDER — PANCRELIPASE (LIP-PROT-AMYL) 12000-38000 UNITS PO CPEP
36000.0000 [IU] | ORAL_CAPSULE | Freq: Three times a day (TID) | ORAL | Status: DC
  Administered 2020-08-11 – 2020-08-17 (×14): 36000 [IU] via ORAL
  Filled 2020-08-10 (×22): qty 3

## 2020-08-10 MED ORDER — RENA-VITE PO TABS
1.0000 | ORAL_TABLET | Freq: Every day | ORAL | Status: DC
  Administered 2020-08-11 – 2020-08-17 (×6): 1 via ORAL
  Filled 2020-08-10 (×7): qty 1

## 2020-08-10 MED ORDER — PANTOPRAZOLE SODIUM 40 MG PO TBEC
40.0000 mg | DELAYED_RELEASE_TABLET | Freq: Every day | ORAL | Status: DC
  Administered 2020-08-10 – 2020-08-14 (×4): 40 mg via ORAL
  Filled 2020-08-10 (×5): qty 1

## 2020-08-10 MED ORDER — AMMONIUM LACTATE 12 % EX LOTN
1.0000 "application " | TOPICAL_LOTION | Freq: Two times a day (BID) | CUTANEOUS | Status: DC | PRN
  Filled 2020-08-10: qty 400

## 2020-08-10 MED ORDER — HYDROCODONE-ACETAMINOPHEN 5-325 MG PO TABS
1.0000 | ORAL_TABLET | Freq: Three times a day (TID) | ORAL | Status: DC | PRN
  Administered 2020-08-11 – 2020-08-14 (×7): 1 via ORAL
  Filled 2020-08-10 (×8): qty 1

## 2020-08-10 MED ORDER — ASPIRIN EC 81 MG PO TBEC
81.0000 mg | DELAYED_RELEASE_TABLET | Freq: Every day | ORAL | Status: DC
  Administered 2020-08-11 – 2020-08-12 (×2): 81 mg via ORAL
  Filled 2020-08-10 (×2): qty 1

## 2020-08-10 MED ORDER — SODIUM CHLORIDE 0.9 % IV SOLN: 100.0000 mL | INTRAVENOUS | Status: DC | PRN

## 2020-08-10 MED ORDER — ALUM & MAG HYDROXIDE-SIMETH 200-200-20 MG/5ML PO SUSP
15.0000 mL | Freq: Four times a day (QID) | ORAL | Status: DC | PRN
  Administered 2020-08-11 – 2020-08-14 (×2): 15 mL via ORAL
  Filled 2020-08-10 (×2): qty 30

## 2020-08-10 MED ORDER — ENSURE ENLIVE PO LIQD
237.0000 mL | ORAL | Status: DC
  Administered 2020-08-11 – 2020-08-16 (×5): 237 mL via ORAL

## 2020-08-10 MED ORDER — PENTAFLUOROPROP-TETRAFLUOROETH EX AERO
1.0000 "application " | INHALATION_SPRAY | CUTANEOUS | Status: DC | PRN
  Filled 2020-08-10: qty 30

## 2020-08-10 MED ORDER — OMEGA-3-ACID ETHYL ESTERS 1 G PO CAPS
1.0000 | ORAL_CAPSULE | Freq: Two times a day (BID) | ORAL | Status: DC
  Administered 2020-08-10 – 2020-08-17 (×11): 1 g via ORAL
  Filled 2020-08-10 (×12): qty 1

## 2020-08-10 MED ORDER — MORPHINE SULFATE (PF) 2 MG/ML IV SOLN
1.0000 mg | INTRAVENOUS | Status: DC | PRN
Start: 2020-08-10 — End: 2020-08-10

## 2020-08-10 MED ORDER — FENTANYL CITRATE (PF) 100 MCG/2ML IJ SOLN
50.0000 ug | Freq: Once | INTRAMUSCULAR | Status: AC
  Administered 2020-08-10: 50 ug via INTRAVENOUS
  Filled 2020-08-10: qty 2

## 2020-08-10 MED ORDER — POTASSIUM CHLORIDE 10 MEQ/100ML IV SOLN
10.0000 meq | INTRAVENOUS | Status: AC
  Administered 2020-08-10: 10 meq via INTRAVENOUS
  Filled 2020-08-10: qty 100

## 2020-08-10 MED ORDER — SODIUM CHLORIDE 0.9 % IV SOLN
250.0000 mL | INTRAVENOUS | Status: DC | PRN
  Administered 2020-08-15: 250 mL via INTRAVENOUS

## 2020-08-10 MED ORDER — FUROSEMIDE 40 MG PO TABS
80.0000 mg | ORAL_TABLET | Freq: Every day | ORAL | Status: DC
  Administered 2020-08-11 – 2020-08-17 (×5): 80 mg via ORAL
  Filled 2020-08-10 (×2): qty 2
  Filled 2020-08-10: qty 1
  Filled 2020-08-10: qty 2
  Filled 2020-08-10 (×2): qty 1
  Filled 2020-08-10 (×3): qty 2
  Filled 2020-08-10: qty 1

## 2020-08-10 MED ORDER — GABAPENTIN 100 MG PO CAPS
100.0000 mg | ORAL_CAPSULE | Freq: Every evening | ORAL | Status: DC
  Administered 2020-08-10 – 2020-08-16 (×6): 100 mg via ORAL
  Filled 2020-08-10 (×6): qty 1

## 2020-08-10 MED ORDER — HYDROXYZINE HCL 25 MG PO TABS
25.0000 mg | ORAL_TABLET | Freq: Every day | ORAL | Status: DC
  Administered 2020-08-10 – 2020-08-17 (×6): 25 mg via ORAL
  Filled 2020-08-10 (×8): qty 1

## 2020-08-10 MED ORDER — DIPHENHYDRAMINE HCL 25 MG PO CAPS
25.0000 mg | ORAL_CAPSULE | Freq: Four times a day (QID) | ORAL | Status: DC | PRN
  Administered 2020-08-12 – 2020-08-16 (×7): 25 mg via ORAL
  Filled 2020-08-10 (×12): qty 1

## 2020-08-10 MED ORDER — VITAMIN D3 25 MCG (1000 UNIT) PO TABS
1000.0000 [IU] | ORAL_TABLET | Freq: Every day | ORAL | Status: DC
  Administered 2020-08-11 – 2020-08-17 (×5): 1000 [IU] via ORAL
  Filled 2020-08-10 (×14): qty 1

## 2020-08-10 MED ORDER — ASCORBIC ACID 500 MG PO TABS
500.0000 mg | ORAL_TABLET | Freq: Every day | ORAL | Status: DC
  Administered 2020-08-11 – 2020-08-17 (×5): 500 mg via ORAL
  Filled 2020-08-10 (×7): qty 1

## 2020-08-10 MED ORDER — MUPIROCIN 2 % EX OINT
1.0000 "application " | TOPICAL_OINTMENT | Freq: Every day | CUTANEOUS | Status: DC | PRN
  Filled 2020-08-10: qty 22

## 2020-08-10 MED ORDER — LIDOCAINE HCL (PF) 1 % IJ SOLN: 5.0000 mL | INTRAMUSCULAR | Status: DC | PRN

## 2020-08-10 MED ORDER — IOHEXOL 350 MG/ML SOLN
75.0000 mL | Freq: Once | INTRAVENOUS | Status: AC | PRN
  Administered 2020-08-10: 75 mL via INTRAVENOUS

## 2020-08-10 MED ORDER — HEPARIN SODIUM (PORCINE) 5000 UNIT/ML IJ SOLN
5000.0000 [IU] | Freq: Three times a day (TID) | INTRAMUSCULAR | Status: DC
  Administered 2020-08-10 – 2020-08-12 (×4): 5000 [IU] via SUBCUTANEOUS
  Filled 2020-08-10 (×4): qty 1

## 2020-08-10 MED ORDER — VITAMIN B-12 1000 MCG PO TABS
1000.0000 ug | ORAL_TABLET | Freq: Every day | ORAL | Status: DC
  Administered 2020-08-11 – 2020-08-17 (×5): 1000 ug via ORAL
  Filled 2020-08-10 (×7): qty 1

## 2020-08-10 MED ORDER — SEVELAMER CARBONATE 800 MG PO TABS
800.0000 mg | ORAL_TABLET | Freq: Three times a day (TID) | ORAL | Status: DC
  Administered 2020-08-10 – 2020-08-11 (×5): 800 mg via ORAL
  Filled 2020-08-10 (×5): qty 1

## 2020-08-10 MED ORDER — CHOLESTYRAMINE LIGHT 4 G PO PACK
4.0000 g | PACK | Freq: Two times a day (BID) | ORAL | Status: DC
  Administered 2020-08-14: 4 g via ORAL
  Filled 2020-08-10 (×15): qty 1

## 2020-08-10 MED ORDER — LIDOCAINE-PRILOCAINE 2.5-2.5 % EX CREA
1.0000 "application " | TOPICAL_CREAM | CUTANEOUS | Status: DC | PRN
  Filled 2020-08-10: qty 5

## 2020-08-10 MED ORDER — ACETAMINOPHEN 325 MG PO TABS: 650.0000 mg | ORAL_TABLET | Freq: Four times a day (QID) | ORAL | Status: DC | PRN

## 2020-08-10 MED ORDER — LIDOCAINE-PRILOCAINE 2.5-2.5 % EX CREA: 1.0000 "application " | TOPICAL_CREAM | CUTANEOUS | Status: DC | PRN

## 2020-08-10 MED ORDER — LIDOCAINE HCL (PF) 1 % IJ SOLN
5.0000 mL | INTRAMUSCULAR | Status: DC | PRN
  Filled 2020-08-10: qty 5

## 2020-08-10 MED ORDER — CARVEDILOL 6.25 MG PO TABS
6.2500 mg | ORAL_TABLET | Freq: Two times a day (BID) | ORAL | Status: DC
  Administered 2020-08-11: 6.25 mg via ORAL
  Filled 2020-08-10: qty 1

## 2020-08-10 MED ORDER — FERROUS SULFATE 325 (65 FE) MG PO TABS
325.0000 mg | ORAL_TABLET | Freq: Every day | ORAL | Status: DC
  Administered 2020-08-11 – 2020-08-17 (×5): 325 mg via ORAL
  Filled 2020-08-10 (×7): qty 1

## 2020-08-10 MED ORDER — NITROGLYCERIN 0.4 MG SL SUBL: 0.4000 mg | SUBLINGUAL_TABLET | SUBLINGUAL | Status: DC | PRN

## 2020-08-10 MED ORDER — CALCIUM CARBONATE ANTACID 500 MG PO CHEW
500.0000 mg | CHEWABLE_TABLET | Freq: Four times a day (QID) | ORAL | Status: DC | PRN
  Administered 2020-08-11 – 2020-08-14 (×2): 500 mg via ORAL
  Filled 2020-08-10 (×2): qty 3

## 2020-08-10 MED ORDER — PROMETHAZINE HCL 25 MG PO TABS
25.0000 mg | ORAL_TABLET | Freq: Four times a day (QID) | ORAL | Status: DC | PRN
  Administered 2020-08-12 – 2020-08-16 (×8): 25 mg via ORAL
  Filled 2020-08-10 (×11): qty 1

## 2020-08-10 MED ORDER — ATORVASTATIN CALCIUM 20 MG PO TABS
20.0000 mg | ORAL_TABLET | Freq: Every evening | ORAL | Status: DC
  Administered 2020-08-10 – 2020-08-16 (×6): 20 mg via ORAL
  Filled 2020-08-10 (×6): qty 1

## 2020-08-10 MED ORDER — POTASSIUM CHLORIDE CRYS ER 20 MEQ PO TBCR: 40.0000 meq | EXTENDED_RELEASE_TABLET | Freq: Once | ORAL | Status: DC

## 2020-08-10 MED ORDER — DM-GUAIFENESIN ER 30-600 MG PO TB12: 1.0000 | ORAL_TABLET | Freq: Two times a day (BID) | ORAL | Status: DC | PRN

## 2020-08-10 MED ORDER — HYDRALAZINE HCL 20 MG/ML IJ SOLN: 5.0000 mg | INTRAMUSCULAR | Status: DC | PRN

## 2020-08-10 MED ORDER — SODIUM CHLORIDE 0.9% FLUSH
3.0000 mL | Freq: Two times a day (BID) | INTRAVENOUS | Status: DC
  Administered 2020-08-10 – 2020-08-17 (×12): 3 mL via INTRAVENOUS

## 2020-08-10 MED ORDER — ASPIRIN EC 81 MG PO TBEC: 81.0000 mg | DELAYED_RELEASE_TABLET | Freq: Every day | ORAL | Status: DC

## 2020-08-10 MED ORDER — HYDRALAZINE HCL 50 MG PO TABS
50.0000 mg | ORAL_TABLET | Freq: Three times a day (TID) | ORAL | Status: DC
  Administered 2020-08-10 – 2020-08-17 (×17): 50 mg via ORAL
  Filled 2020-08-10 (×17): qty 1

## 2020-08-10 MED ORDER — SODIUM CHLORIDE 0.9% FLUSH: 3.0000 mL | INTRAVENOUS | Status: DC | PRN

## 2020-08-10 MED ORDER — ALBUTEROL SULFATE (2.5 MG/3ML) 0.083% IN NEBU: 2.5000 mg | INHALATION_SOLUTION | RESPIRATORY_TRACT | Status: DC | PRN

## 2020-08-10 MED ORDER — CHLORHEXIDINE GLUCONATE CLOTH 2 % EX PADS
6.0000 | MEDICATED_PAD | Freq: Every day | CUTANEOUS | Status: DC
  Administered 2020-08-12 – 2020-08-14 (×3): 6 via TOPICAL
  Filled 2020-08-10: qty 6

## 2020-08-10 MED ORDER — DEXTROSE 50 % IV SOLN
50.0000 mL | INTRAVENOUS | Status: DC | PRN
  Administered 2020-08-17: 50 mL via INTRAVENOUS
  Filled 2020-08-10: qty 50

## 2020-08-10 MED ORDER — HEPARIN SODIUM (PORCINE) 1000 UNIT/ML DIALYSIS: 1000.0000 [IU] | INTRAMUSCULAR | Status: DC | PRN

## 2020-08-10 NOTE — H&P (Addendum)
History and Physical    [name] WVP:[id number] DOB: February 27, 1961 DOA: 08/10/2020  Referring MD/NP/PA:   PCP: [facility] [place]   Patient coming from:  The patient is coming from home.  At baseline, pt is independent for most of ADL.        Chief Complaint: SOB  HPI: [name] is a 59 y.o. female with medical history significant of ESRD-HD (MWF), dCHF, anemia, CAD, renal cell cancer, pancreatitis, anemia, HTN, HLD, TIA, diet controled DM, COPD, asthma, depression, anxiety, who presents with shortness of breath.  Patient states that she developed shortness of breath today, which has been progressively worsening.  She has dry cough, no fever or chills. Patient also had chest pain earlier, which has resolved currently.  The chest pain was located in the substernal area, dull, moderate, nonradiating.  Patient states that she has chronic diarrhea which has not changed.  She had 1 diarrhea this morning, no vomiting, abdominal pain.  No unilateral weakness. Patient was found to have oxygen desaturation to 83% on room air, which improved her to 100% on 3 L oxygen in ED. Patient had dialysis on Wednesday.  ED Course: pt was found to have WBC 4.3, troponin level 8, pending Covid PCR, potassium 2.9, bicarbonate 28, creatinine 3.74, BUN 8, blood sugar 46 on BMI, temperature 99, blood pressure 157/66, heart rate 68, RR 20, chest x-ray showed fluid overload and pulmonary edema.  Patient is placed on progressive bed for observation.  Dr. Holley Raring of nephrology is consulted.  Review of Systems:   General: no fevers, chills, no body weight gain, has fatigue HEENT: no blurry vision, hearing changes or sore throat Respiratory: has dyspnea, coughing, no wheezing CV: has chest pain, no palpitations GI: no nausea, vomiting, abdominal pain, has diarrhea, no constipation GU: no dysuria, burning on urination, increased urinary frequency, hematuria  Ext: has leg edema Neuro: no unilateral  weakness, numbness, or tingling, no vision change or hearing loss Skin: no rash, no skin tear. MSK: No muscle spasm, no deformity, no limitation of range of movement in spin Heme: No easy bruising.  Travel history: No recent long distant travel.  Allergy:  Allergies  Allergen Reactions  . Ace Inhibitors Swelling and Anaphylaxis  . Ativan [Lorazepam] Other (See Comments)    Reaction:Hallucinations and headaches  . Compazine [Prochlorperazine Edisylate] Anaphylaxis, Nausea And Vomiting and Other (See Comments)    Other reaction(s): dystonia from this vs. Reglan, 23 Jul - patient relates that she takes promethazine frequently with no problems  . Sumatriptan Succinate Other (See Comments)    Other reaction(s): delirium and hallucinations per [facility] records  . Zofran [Ondansetron] Nausea And Vomiting    Per pt. she is allergic to zofran or will experience adverse reaction like hallucination   . Losartan Nausea Only  . Prochlorperazine Other (See Comments)    Reaction:  Unknown . Patient does not remember reaction but she does have vertigo and anxiety along with n and v at times. Could be used to treat any of these   . Reglan [Metoclopramide] Other (See Comments)    Per patient her Dr. Evelina Bucy her off it   . Scopolamine Other (See Comments)    Dizziness, also has vertigo already  . Tape Rash    Plastic tape causes rash  . Tapentadol Rash    Past Medical History:  Diagnosis Date  . Anemia   . Anginal pain ([place])   . Anxiety   . Arthritis   .  Asthma   . Broken wrist   . Bronchitis   . chronic diastolic CHF 12/29/8117  . Chronic kidney disease   . COPD (chronic obstructive pulmonary disease) ([place])   . Coronary artery disease    a. cath 2013: stenting to RCA (report not available); b. cath 2014: LM nl, pLAD 40%, mLAD nl, ost LCx 40%, mid LCx nl, pRCA 30% @ site of prior stent, mRCA 50%  . Depression   . Diabetes mellitus ([place])   . Diabetes mellitus without complication ([place])   .  Diabetic neuropathy ([place])   . dialysis 2006  . Diverticulosis   . Dizziness   . Dyspnea   . Elevated lipids   . Environmental and seasonal allergies   . ESRD (end stage renal disease) on dialysis ([place])    M-W-F  . Gastroparesis   . GERD (gastroesophageal reflux disease)   . Headache   . History of anemia due to chronic kidney disease   . History of hiatal hernia   . HOH (hard of hearing)   . Hx of pancreatitis 2015  . Hypertension   . Lower extremity edema   . Mitral regurgitation    a. echo 10/2013: EF 62%, noWMA, mildly dilated LA, mild to mod MR/TR, GR1DD  . Myocardial infarction ([place])   . Orthopnea   . Parathyroid abnormality ([place])   . Peripheral arterial disease ([place])   . Pneumonia   . Renal cancer ([place])   . Renal insufficiency    Pt is on dialysis on M,W + F.  . Wheezing     Past Surgical History:  Procedure Laterality Date  . A/V SHUNTOGRAM Left 01/20/2018   Procedure: A/V SHUNTOGRAM;  Surgeon: Algernon Huxley, MD;  Location: [place] [facility];  Service: Cardiovascular;  Laterality: Left;  . ABDOMINAL HYSTERECTOMY  1992  . AMPUTATION TOE Left 10/02/2017   Procedure: AMPUTATION TOE-LEFT GREAT TOE;  Surgeon: Albertine Patricia, DPM;  Location: [facility];  Service: Podiatry;  Laterality: Left;  . APPENDECTOMY    . APPLICATION OF WOUND VAC N/A 11/25/2019   Procedure: APPLICATION OF WOUND VAC;  Surgeon: Katha Cabal, MD;  Location: [facility];  Service: Vascular;  Laterality: N/A;  . ARTERY BIOPSY Right 10/11/2018   Procedure: BIOPSY TEMPORAL ARTERY;  Surgeon: Vickie Epley, MD;  Location: [facility];  Service: General;  Laterality: Right;  . CARDIAC CATHETERIZATION Left 07/26/2015   Procedure: Left Heart Cath and Coronary Angiography;  Surgeon: Dionisio David, MD;  Location: [place] [facility];  Service: Cardiovascular;  Laterality: Left;  . CATARACT EXTRACTION W/ INTRAOCULAR LENS IMPLANT Right   . CATARACT EXTRACTION W/PHACO Left 03/10/2017   Procedure: CATARACT  EXTRACTION PHACO AND INTRAOCULAR LENS PLACEMENT (IOC);  Surgeon: Birder Robson, MD;  Location: [facility];  Service: Ophthalmology;  Laterality: Left;  [place] 00:51.9 AP% 14.2 CDE 7.39 fluid pack lot # [id number]  . CENTRAL LINE INSERTION Right 11/11/2019   Procedure: CENTRAL LINE INSERTION;  Surgeon: Katha Cabal, MD;  Location: [facility];  Service: Vascular;  Laterality: Right;  . CENTRAL LINE INSERTION  11/25/2019   Procedure: CENTRAL LINE INSERTION;  Surgeon: Katha Cabal, MD;  Location: [facility];  Service: Vascular;;  . CHOLECYSTECTOMY    . COLONOSCOPY WITH PROPOFOL N/A 08/12/2016   Procedure: COLONOSCOPY WITH PROPOFOL;  Surgeon: Lollie Sails, MD;  Location: [facility];  Service: Endoscopy;  Laterality: N/A;  . DIALYSIS FISTULA CREATION Left    upper arm  . dialysis grafts    .  DIALYSIS/PERMA CATHETER INSERTION N/A 11/14/2019   Procedure: DIALYSIS/PERMA CATHETER INSERTION;  Surgeon: Algernon Huxley, MD;  Location: [place] [facility];  Service: Cardiovascular;  Laterality: N/A;  . DIALYSIS/PERMA CATHETER INSERTION N/A 02/03/2020   Procedure: DIALYSIS/PERMA CATHETER INSERTION;  Surgeon: Katha Cabal, MD;  Location: [place] [facility];  Service: Cardiovascular;  Laterality: N/A;  . DIALYSIS/PERMA CATHETER INSERTION N/A 06/19/2020   Procedure: DIALYSIS/PERMA CATHETER INSERTION;  Surgeon: Katha Cabal, MD;  Location: [place] [facility];  Service: Cardiovascular;  Laterality: N/A;  . DIALYSIS/PERMA CATHETER REMOVAL N/A 05/25/2020   Procedure: DIALYSIS/PERMA CATHETER REMOVAL;  Surgeon: Katha Cabal, MD;  Location: [place] [facility];  Service: Cardiovascular;  Laterality: N/A;  . ESOPHAGOGASTRODUODENOSCOPY N/A 03/08/2015   Procedure: ESOPHAGOGASTRODUODENOSCOPY (EGD);  Surgeon: Manya Silvas, MD;  Location: [facility];  Service: Endoscopy;  Laterality: N/A;  . ESOPHAGOGASTRODUODENOSCOPY (EGD) WITH PROPOFOL N/A 03/18/2016   Procedure:  ESOPHAGOGASTRODUODENOSCOPY (EGD) WITH PROPOFOL;  Surgeon: [name], MD;  Location: [facility]OSCOPY;  Service: Endoscopy;  Laterality: N/A;  . EYE SURGERY Right 2018  . FECAL TRANSPLANT N/A 08/23/2015   Procedure: FECAL TRANSPLANT;  Surgeon: Manya Silvas, MD;  Location: [facility] ENDOSCOPY;  Service: Endoscopy;  Laterality: N/A;  . HAND SURGERY Bilateral   . HEMATOMA EVACUATION Left 11/25/2019   Procedure: EVACUATION HEMATOMA;  Surgeon: Katha Cabal, MD;  Location: [facility];  Service: Vascular;  Laterality: Left;  . I & D EXTREMITY Left 11/25/2019   Procedure: IRRIGATION AND DEBRIDEMENT EXTREMITY;  Surgeon: Katha Cabal, MD;  Location: [facility];  Service: Vascular;  Laterality: Left;  . IR FLUORO GUIDE CV LINE RIGHT  04/06/2020  . IR RADIOLOGIST EVAL & MGMT  07/28/2019  . IR RADIOLOGIST EVAL & MGMT  08/11/2019  . LIGATION OF ARTERIOVENOUS  FISTULA Left 11/11/2019  . LIGATION OF ARTERIOVENOUS  FISTULA Left 11/11/2019   Procedure: LIGATION OF ARTERIOVENOUS  FISTULA;  Surgeon: Katha Cabal, MD;  Location: [facility];  Service: Vascular;  Laterality: Left;  . LIGATIONS OF HERO GRAFT Right 06/13/2020   Procedure: LIGATION / REMOVAL OF RIGHT HERO GRAFT;  Surgeon: Katha Cabal, MD;  Location: [facility];  Service: Vascular;  Laterality: Right;  . PERIPHERAL VASCULAR CATHETERIZATION N/A 12/20/2015   Procedure: Thrombectomy of dialysis access versus permcath placement;  Surgeon: Algernon Huxley, MD;  Location: [place] [facility];  Service: Cardiovascular;  Laterality: N/A;  . PERIPHERAL VASCULAR CATHETERIZATION N/A 12/20/2015   Procedure: A/V Shunt Intervention;  Surgeon: Algernon Huxley, MD;  Location: [place] [facility];  Service: Cardiovascular;  Laterality: N/A;  . PERIPHERAL VASCULAR CATHETERIZATION N/A 12/20/2015   Procedure: A/V Shuntogram/Fistulagram;  Surgeon: Algernon Huxley, MD;  Location: [place] [facility];  Service: Cardiovascular;  Laterality: N/A;  . PERIPHERAL VASCULAR  CATHETERIZATION N/A 01/02/2016   Procedure: A/V Shuntogram/Fistulagram;  Surgeon: Algernon Huxley, MD;  Location: [place] [facility];  Service: Cardiovascular;  Laterality: N/A;  . PERIPHERAL VASCULAR CATHETERIZATION N/A 01/02/2016   Procedure: A/V Shunt Intervention;  Surgeon: Algernon Huxley, MD;  Location: [place] [facility];  Service: Cardiovascular;  Laterality: N/A;  . TEE WITHOUT CARDIOVERSION N/A 06/11/2020   Procedure: TRANSESOPHAGEAL ECHOCARDIOGRAM (TEE);  Surgeon: Teodoro Spray, MD;  Location: [facility];  Service: Cardiovascular;  Laterality: N/A;  . UPPER EXTREMITY VENOGRAPHY Right 01/18/2020   Procedure: UPPER EXTREMITY VENOGRAPHY;  Surgeon: Katha Cabal, MD;  Location: [place] [facility];  Service: Cardiovascular;  Laterality: Right;  .  VASCULAR ACCESS DEVICE INSERTION Right 04/13/2020   Procedure: INSERTION OF HERO VASCULAR ACCESS DEVICE (GRAFT);  Surgeon: Katha Cabal, MD;  Location: [facility];  Service: Vascular;  Laterality: Right;    Social History:  reports that she has never smoked. She has never used smokeless tobacco. She reports previous alcohol use. She reports current drug use. Drug: Marijuana.  Family History:  Family History  Problem Relation Age of Onset  . Kidney disease Mother   . Diabetes Mother   . Cancer Father   . Kidney disease Sister      Prior to Admission medications   Medication Sig Start Date End Date Taking? Authorizing Provider  albuterol (PROVENTIL) (2.5 MG/3ML) 0.083% nebulizer solution Take 2.5 mg by nebulization every 4 (four) hours as needed for wheezing or shortness of breath.    [provider]  albuterol (VENTOLIN HFA) 108 (90 Base) MCG/ACT inhaler Inhale 2 puffs into the lungs every 4 (four) hours as needed for wheezing or shortness of breath. 11/09/19   [provider]  alum & mag hydroxide-simeth (MAALOX/MYLANTA) 200-200-20 MG/5ML suspension Take 15 mLs by mouth every 6 (six) hours as needed for indigestion or  heartburn. 06/25/20   Enzo Bi, MD  amLODipine (NORVASC) 10 MG tablet Take 1 tablet (10 mg total) by mouth daily. 06/25/20   Enzo Bi, MD  ammonium lactate (LAC-HYDRIN) 12 % lotion Apply 1 application topically 2 (two) times daily as needed for dry skin.     [provider]  Ascorbic Acid (VITA-C PO) Take 1 tablet by mouth daily.    [provider]  aspirin EC 81 MG tablet Take 81 mg by mouth daily.    [provider]  atorvastatin (LIPITOR) 20 MG tablet Take 20 mg by mouth every evening.  10/24/19   [provider]  calcium carbonate (TUMS - DOSED IN MG ELEMENTAL CALCIUM) 500 MG chewable tablet Chew 2.5 tablets (500 mg of elemental calcium total) by mouth every 6 (six) hours as needed for indigestion. 06/25/20   Enzo Bi, MD  carvedilol (COREG) 6.25 MG tablet Take 6.25 mg by mouth 2 (two) times daily with a meal.    [provider]  Cholecalciferol (VITAMIN D3) 25 MCG (1000 UT) CAPS Take 1,000 Units by mouth daily.     [provider]  cholestyramine light (PREVALITE) 4 g packet Take 4 g by mouth 2 (two) times daily.    [provider]  CREON 12000 units CPEP capsule Take 12,000-36,000 Units by mouth See admin instructions. Take 3 capsules (36,000u) by mouth three times daily with meals and take 1 capsule (12,000u) by mouth daily with a snack 10/21/19   [provider]  cyanocobalamin 1000 MCG tablet Take 1,000 mcg by mouth daily.     [provider]  diphenhydrAMINE (BENADRYL) 25 MG tablet Take 25 mg by mouth every 6 (six) hours as needed for itching.    [provider]  epoetin alfa (EPOGEN) 10000 UNIT/ML injection Inject 1 mL (10,000 Units total) into the vein every Monday, Wednesday, and Friday with hemodialysis. 06/27/20   Enzo Bi, MD  feeding supplement, ENSURE ENLIVE, (ENSURE ENLIVE) LIQD Take 237 mLs by mouth daily. 06/26/20   Enzo Bi, MD  Ferrous Sulfate (IRON) 325 (65 Fe) MG TABS Take 325 mg by mouth  daily.     [provider]  furosemide (LASIX) 80 MG tablet Take 80 mg by mouth daily. 05/08/20   [provider]  gabapentin (NEURONTIN) 100 MG  capsule Take 100 mg by mouth every evening.     [provider]  hydrALAZINE (APRESOLINE) 50 MG tablet Take 1 tablet (50 mg total) by mouth every 8 (eight) hours. 06/25/20   Enzo Bi, MD  hydrOXYzine (ATARAX/VISTARIL) 25 MG tablet Take 25 mg by mouth daily.     [provider]  multivitamin (RENA-VIT) TABS tablet Take 1 tablet by mouth daily.     [provider]  mupirocin ointment (BACTROBAN) 2 % Apply 1 application topically daily as needed (leg rash).  11/09/19   [provider]  nitroGLYCERIN (NITROSTAT) 0.4 MG SL tablet Place 0.4 mg under the tongue every 5 (five) minutes as needed for chest pain.     [provider]  Omega-3 300 MG CAPS Take 300 mg by mouth 2 (two) times daily. Every other day opposite omega XL    [provider]  pantoprazole (PROTONIX) 40 MG tablet Take 40 mg by mouth daily. 08/28/19   [provider]  polyethylene glycol (MIRALAX / GLYCOLAX) 17 g packet Take 17 g by mouth daily. 06/25/20   Enzo Bi, MD  Probiotic Product (PROBIOTIC PO) Take 1 capsule by mouth daily.    [provider]  promethazine (PHENERGAN) 25 MG tablet Take 1 tablet (25 mg total) by mouth every 6 (six) hours as needed for nausea or vomiting. 06/25/20   Enzo Bi, MD  sevelamer carbonate (RENVELA) 800 MG tablet Take 800 mg by mouth 4 (four) times daily. (at meals and with a snack)    [provider]  traMADol (ULTRAM) 50 MG tablet Take 1 tablet (50 mg total) by mouth every 8 (eight) hours as needed for moderate pain or severe pain. 06/25/20   Enzo Bi, MD    Physical Exam: Vitals:   08/10/20 1145 08/10/20 1200 08/10/20 1230 08/10/20 1300  BP:  (!) 157/66 (!) 162/72 (!) 159/66  Pulse: 69 68 71 71  Resp: 15 20 18 16   Temp:      SpO2: 100% 100% 100% 100%  Weight:       Height:       General: Not in acute distress HEENT:       Eyes: PERRL, EOMI, no scleral icterus.       ENT: No discharge from the ears and nose, no pharynx injection, no tonsillar enlargement.        Neck: No JVD, no bruit, no mass felt. Heme: No neck lymph node enlargement. Cardiac: S1/S2, RRR,  No gallops or rubs. Respiratory: Has fine crackles bilaterally GI: Soft, nondistended, nontender, no rebound pain, no organomegaly, BS present. GU: No hematuria Ext: 2+ pitting leg edema bilaterally. 1+DP/PT pulse bilaterally. Musculoskeletal: No joint deformities, No joint redness or warmth, no limitation of ROM in spin. Skin: No rashes.  Neuro: Alert, oriented X3, cranial nerves II-XII grossly intact, moves all extremities normally.  Psych: Patient is not psychotic, no suicidal or hemocidal ideation.  Labs on Admission: I have personally reviewed following labs and imaging studies  CBC: Recent Labs  Lab 08/10/20 1120  WBC 4.3  HGB 7.4*  HCT 22.5*  MCV 87.9  PLT 673*   Basic Metabolic Panel: Recent Labs  Lab 08/10/20 1120  NA 139  K 2.9*  CL 99  CO2 28  GLUCOSE 46*  BUN 8  CREATININE 3.74*  CALCIUM 7.7*   GFR: Estimated Creatinine Clearance: 16.5 mL/min (A) (by C-G formula based on SCr of 3.74 mg/dL (H)). Liver Function Tests: Recent Labs  Lab 08/10/20 1120  AST 17  ALT 10  ALKPHOS 68  BILITOT 0.5  PROT 6.1*  ALBUMIN 2.4*   No results for input(s): LIPASE, AMYLASE in the last 168 hours. No results for input(s): AMMONIA in the last 168 hours. Coagulation Profile: No results for input(s): INR, PROTIME in the last 168 hours. Cardiac Enzymes: No results for input(s): CKTOTAL, CKMB, CKMBINDEX, TROPONINI in the last 168 hours. BNP (last 3 results) No results for input(s): PROBNP in the last 8760 hours. HbA1C: No results for input(s): HGBA1C in the last 72 hours. CBG: No results for input(s): GLUCAP in the last 168 hours. Lipid Profile: No results for  input(s): CHOL, HDL, LDLCALC, TRIG, CHOLHDL, LDLDIRECT in the last 72 hours. Thyroid Function Tests: No results for input(s): TSH, T4TOTAL, FREET4, T3FREE, THYROIDAB in the last 72 hours. Anemia Panel: No results for input(s): VITAMINB12, FOLATE, FERRITIN, TIBC, IRON, RETICCTPCT in the last 72 hours. Urine analysis:    Component Value Date/Time   COLORURINE YELLOW (A) 06/09/2017 1039   APPEARANCEUR CLEAR (A) 06/09/2017 1039   APPEARANCEUR Clear 09/15/2014 0947   LABSPEC 1.014 06/09/2017 1039   LABSPEC 1.008 09/15/2014 0947   PHURINE 8.0 06/09/2017 1039   GLUCOSEU 150 (A) 06/09/2017 1039   GLUCOSEU 150 mg/dL 09/15/2014 0947   HGBUR SMALL (A) 06/09/2017 1039   BILIRUBINUR NEGATIVE 06/09/2017 1039   BILIRUBINUR Negative 09/15/2014 0947   KETONESUR NEGATIVE 06/09/2017 1039   PROTEINUR >=300 (A) 06/09/2017 1039   UROBILINOGEN 0.2 06/30/2010 2130   NITRITE NEGATIVE 06/09/2017 1039   LEUKOCYTESUR NEGATIVE 06/09/2017 1039   LEUKOCYTESUR Negative 09/15/2014 0947   Sepsis Labs: @LABRCNTIP (procalcitonin:4,lacticidven:4) ) Recent Results (from the past 240 hour(s))  Resp Panel by RT-PCR (Flu A&B, Covid) Nasopharyngeal Swab     Status: None   Collection Time: 08/10/20 11:20 AM   Specimen: Nasopharyngeal Swab; Nasopharyngeal(NP) swabs in vial transport medium  Result Value Ref Range Status   SARS Coronavirus 2 by RT PCR NEGATIVE NEGATIVE Final    Comment: (NOTE) SARS-CoV-2 target nucleic acids are NOT DETECTED.  The SARS-CoV-2 RNA is generally detectable in upper respiratory specimens during the acute phase of infection. The lowest concentration of SARS-CoV-2 viral copies this assay can detect is 138 copies/mL. A negative result does not preclude SARS-Cov-2 infection and should not be used as the sole basis for treatment or other patient management decisions. A negative result may occur with  improper specimen collection/handling, submission of specimen other than nasopharyngeal swab,  presence of viral mutation(s) within the areas targeted by this assay, and inadequate number of viral copies(<138 copies/mL). A negative result must be combined with clinical observations, patient history, and epidemiological information. The expected result is Negative.  Fact Sheet for Patients:  EntrepreneurPulse.com.au  Fact Sheet for Healthcare Providers:  IncredibleEmployment.be  This test is no t yet approved or cleared by the [place] FDA and  has been authorized for detection and/or diagnosis of SARS-CoV-2 by FDA under an Emergency Use Authorization (EUA). This EUA will remain  in effect (meaning this test can be used) for the duration of the COVID-19 declaration under Section 564(b)(1) of the Act, 21 U.S.C.section 360bbb-3(b)(1), unless the authorization is terminated  or revoked sooner.       Influenza A by PCR NEGATIVE NEGATIVE Final   Influenza B by PCR NEGATIVE NEGATIVE Final    Comment: (NOTE) The Xpert Xpress SARS-CoV-2/FLU/RSV plus assay is intended as an aid in the diagnosis of influenza from Nasopharyngeal swab specimens and should not be used as a sole  basis for treatment. Nasal washings and aspirates are unacceptable for Xpert Xpress SARS-CoV-2/FLU/RSV testing.  Fact Sheet for Patients: EntrepreneurPulse.com.au  Fact Sheet for Healthcare Providers: IncredibleEmployment.be  This test is not yet approved or cleared by the [place] FDA and has been authorized for detection and/or diagnosis of SARS-CoV-2 by FDA under an Emergency Use Authorization (EUA). This EUA will remain in effect (meaning this test can be used) for the duration of the COVID-19 declaration under Section 564(b)(1) of the Act, 21 U.S.C. section 360bbb-3(b)(1), unless the authorization is terminated or revoked.  Performed at [facility], [address] [place], [place] [address]       Radiological Exams on Admission: DG Chest Portable 1 View  Result Date: 08/10/2020 CLINICAL DATA:  Chest pain shortness of breath, history of asthma. EXAM: PORTABLE CHEST 1 VIEW COMPARISON:  CT the abdomen and pelvis from September of 2021 and CT of the chest also from September of 2021 FINDINGS: Signs of LEFT axillary and subclavian venous stenting similar to the prior exam. Stenting also in the RIGHT mid arm. Interval removal of RIGHT-sided central venous access device and placement of a LEFT-sided dialysis catheter, tip in the mid to low RIGHT atrium relative to the longest lumen. Trachea midline, air column normal. Cardiomediastinal contours with enlargement. Hilar structures with engorgement of central pulmonary vasculature with increased interstitial markings bilaterally. Blunting of LEFT costodiaphragmatic sulcus in patchy basilar airspace opacities. On limited assessment no acute skeletal process. IMPRESSION: 1. Findings consistent with volume overload/CHF. Difficult to exclude the possibility of concomitant infection particularly in the LEFT lung base. 2. New LEFT-sided dialysis catheter, tip in the mid to low RIGHT atrium. Electronically Signed   By: Zetta Bills M.D.   On: 08/10/2020 10:17     EKG:   Not done in ED, will get one.   Assessment/Plan Principal Problem:   Acute pulmonary edema (HCC) Active Problems:   End-stage renal disease on hemodialysis (HCC)   HTN (hypertension)   Acute respiratory failure with hypoxia (HCC)   Acute on chronic diastolic CHF (congestive heart failure) (HCC)   Hypokalemia   Hypoglycemia   TIA (transient ischemic attack)   Benign essential HTN   Anemia in ESRD (end-stage renal disease) (HCC)   Fluid overload   HLD (hyperlipidemia)   Chest pain   CAD (coronary artery disease)   GERD (gastroesophageal reflux disease)    Acute respiratory failure with hypoxia due to acute pulmonary edema and acute on chronic diastolic CHF: Patient has 2+  leg edema, crackles on auscultation, chest x-ray ordered fluid overload and pulmonary edema, clinically consistent with CHF exacerbation and fluid overload/pulmonary edema.  2D echo 06/11/2020 showed EF of 50-55%. Dr. Holley Raring of renal is consulted for HD.  -Place to progressive unit for observation -Daily weights -strict I/O's -Fluid restriction -Obtain REDs Vest reading -Bronchodilators -Nasal cannula oxygen to maintain oxygen saturation above 93% -HD per renal  End-stage renal disease on hemodialysis (MWF): -HD per renal  HTN (hypertension) -IV hydralazine as needed -Amlodipine, Coreg, hydralazine  Hypoglycemia: K 2.9 -will give 10 mEQ of KCl x 2 by IV -pt will have HD -check Mg level  TIA (transient ischemic attack) -ASA and lipitor  Anemia in ESRD (end-stage renal disease) ([place]): Hemoglobin 7.9 on 06/25/2028, 7.4 today, slightly dropped. -Continue iron supplement  HLD (hyperlipidemia) -Lipitor  Chest pain and hx of CAD: Likely due to demand ischemia secondary to fluid overload and pulmonary edema. Initial trop is 8. -ASA, lipitor -Trend troponin -Check A1c,  FLP -Check UDS  Addendum: pt has persistent chest pain. Trop negative so far. -will get CTA to r/o PE  GERD (gastroesophageal reflux disease) -Protonix     DVT ppx: SQ Heparin   Code Status: Full code Family Communication:  Yes, patient's son at bed side Disposition Plan:  Anticipate discharge back to previous environment Consults called:  Dr.Lateef of renal Admission status:  progressive unit for obs   Status is: Observation  The patient remains OBS appropriate and will d/c before 2 midnights.  Dispo: The patient is from: Home              Anticipated d/c is to: Home              Anticipated d/c date is: 1 day              Patient currently is not medically stable to d/c.          Date of Service 08/10/2020    [facility]   If 7PM-7AM, please contact  night-coverage [email or web address] 08/10/2020, 1:30 PM

## 2020-08-10 NOTE — ED Notes (Signed)
Called report to [employer].

## 2020-08-10 NOTE — ED Triage Notes (Signed)
Reported via [facility] due to nonspecific shortness of breath and chest pain.

## 2020-08-10 NOTE — ED Triage Notes (Signed)
Patient is on dialysis (MWF).

## 2020-08-10 NOTE — ED Notes (Signed)
IV unsuccessful by IV nurse. Dr. Kerman Passey made aware. States he will attempt IJ access. Ultrasound placed in room for MD use. MD notified.

## 2020-08-10 NOTE — Progress Notes (Signed)
[facility]  ROUNDING NOTE   Subjective:   [name] is 59 years old female, well known to our practice,presented to the ED today, with worsening SOB. She also reported chest pain, which resolved prior to  ED arrival. SpO2 was 83% on arrival, currently 100% on 3L O2 via nasal cannula. Patient has h/o ESRD, on dialysis MWF. We will arrange dialysis for her today.Orders placed.  Objective:  Vital signs in last 24 hours:  Temp:  [99 F (37.2 C)] 99 F (37.2 C) (11/19 0923) Pulse Rate:  [66-74] 66 (11/19 1430) Resp:  [10-20] 16 (11/19 1430) BP: (119-165)/(59-88) 158/63 (11/19 1430) SpO2:  [87 %-100 %] 100 % (11/19 1430) Weight:  [80.3 kg] 80.3 kg (11/19 0927)  Weight change:  Filed Weights   08/10/20 0927  Weight: 80.3 kg    Intake/Output: No intake/output data recorded.   Intake/Output this shift:  No intake/output data recorded.  Physical Exam: General: Resting in bed, with eyes closed,arousable to call  Head: Normocephalic, atraumatic. Moist oral mucosal membranes  Eyes: Anicteric  Lungs:  Respiratory effort normal and symmetrical,Lungs with fine crackles bilaterally  Heart: S1S2 no rubs or gallops  Abdomen:  Soft, nontender  Extremities:  1+  peripheral edema,tight, very tender  Neurologic: Sleeping, arousable,speech clear and appropriate,moving all four extremities  Skin: No acute lesions or rashes  Access: Lt IJ catheter    Basic Metabolic Panel: Recent Labs  Lab 08/10/20 1120 08/10/20 1330  NA 139  --   K 2.9*  --   CL 99  --   CO2 28  --   GLUCOSE 46*  --   BUN 8  --   CREATININE 3.74*  --   CALCIUM 7.7*  --   MG  --  1.8    Liver Function Tests: Recent Labs  Lab 08/10/20 1120  AST 17  ALT 10  ALKPHOS 68  BILITOT 0.5  PROT 6.1*  ALBUMIN 2.4*   No results for input(s): LIPASE, AMYLASE in the last 168 hours. No results for input(s): AMMONIA in the last 168 hours.  CBC: Recent Labs  Lab 08/10/20 1120  WBC 4.3  HGB 7.4*   HCT 22.5*  MCV 87.9  PLT 146*    Cardiac Enzymes: No results for input(s): CKTOTAL, CKMB, CKMBINDEX, TROPONINI in the last 168 hours.  BNP: Invalid input(s): POCBNP  CBG: No results for input(s): GLUCAP in the last 168 hours.  Microbiology: Results for orders placed or performed during the hospital encounter of 08/10/20  Resp Panel by RT-PCR (Flu A&B, Covid) Nasopharyngeal Swab     Status: None   Collection Time: 08/10/20 11:20 AM   Specimen: Nasopharyngeal Swab; Nasopharyngeal(NP) swabs in vial transport medium  Result Value Ref Range Status   SARS Coronavirus 2 by RT PCR NEGATIVE NEGATIVE Final    Comment: (NOTE) SARS-CoV-2 target nucleic acids are NOT DETECTED.  The SARS-CoV-2 RNA is generally detectable in upper respiratory specimens during the acute phase of infection. The lowest concentration of SARS-CoV-2 viral copies this assay can detect is 138 copies/mL. A negative result does not preclude SARS-Cov-2 infection and should not be used as the sole basis for treatment or other patient management decisions. A negative result may occur with  improper specimen collection/handling, submission of specimen other than nasopharyngeal swab, presence of viral mutation(s) within the areas targeted by this assay, and inadequate number of viral copies(<138 copies/mL). A negative result must be combined with clinical observations, patient history, and epidemiological information. The expected  result is Negative.  Fact Sheet for Patients:  EntrepreneurPulse.com.au  Fact Sheet for Healthcare Providers:  IncredibleEmployment.be  This test is no t yet approved or cleared by the [place] FDA and  has been authorized for detection and/or diagnosis of SARS-CoV-2 by FDA under an Emergency Use Authorization (EUA). This EUA will remain  in effect (meaning this test can be used) for the duration of the COVID-19 declaration under Section 564(b)(1)  of the Act, 21 U.S.C.section 360bbb-3(b)(1), unless the authorization is terminated  or revoked sooner.       Influenza A by PCR NEGATIVE NEGATIVE Final   Influenza B by PCR NEGATIVE NEGATIVE Final    Comment: (NOTE) The Xpert Xpress SARS-CoV-2/FLU/RSV plus assay is intended as an aid in the diagnosis of influenza from Nasopharyngeal swab specimens and should not be used as a sole basis for treatment. Nasal washings and aspirates are unacceptable for Xpert Xpress SARS-CoV-2/FLU/RSV testing.  Fact Sheet for Patients: EntrepreneurPulse.com.au  Fact Sheet for Healthcare Providers: IncredibleEmployment.be  This test is not yet approved or cleared by the [place] FDA and has been authorized for detection and/or diagnosis of SARS-CoV-2 by FDA under an Emergency Use Authorization (EUA). This EUA will remain in effect (meaning this test can be used) for the duration of the COVID-19 declaration under Section 564(b)(1) of the Act, 21 U.S.C. section 360bbb-3(b)(1), unless the authorization is terminated or revoked.  Performed at [facility], [address] [place], [place] [address]     Coagulation Studies: No results for input(s): LABPROT, INR in the last 72 hours.  Urinalysis: No results for input(s): COLORURINE, LABSPEC, PHURINE, GLUCOSEU, HGBUR, BILIRUBINUR, KETONESUR, PROTEINUR, UROBILINOGEN, NITRITE, LEUKOCYTESUR in the last 72 hours.  Invalid input(s): APPERANCEUR    Imaging: DG Chest Portable 1 View  Result Date: 08/10/2020 CLINICAL DATA:  Chest pain shortness of breath, history of asthma. EXAM: PORTABLE CHEST 1 VIEW COMPARISON:  CT the abdomen and pelvis from September of 2021 and CT of the chest also from September of 2021 FINDINGS: Signs of LEFT axillary and subclavian venous stenting similar to the prior exam. Stenting also in the RIGHT mid arm. Interval removal of RIGHT-sided central venous access device and  placement of a LEFT-sided dialysis catheter, tip in the mid to low RIGHT atrium relative to the longest lumen. Trachea midline, air column normal. Cardiomediastinal contours with enlargement. Hilar structures with engorgement of central pulmonary vasculature with increased interstitial markings bilaterally. Blunting of LEFT costodiaphragmatic sulcus in patchy basilar airspace opacities. On limited assessment no acute skeletal process. IMPRESSION: 1. Findings consistent with volume overload/CHF. Difficult to exclude the possibility of concomitant infection particularly in the LEFT lung base. 2. New LEFT-sided dialysis catheter, tip in the mid to low RIGHT atrium. Electronically Signed   By: Zetta Bills M.D.   On: 08/10/2020 10:17     Medications:   . sodium chloride    . sodium chloride    . sodium chloride     . aspirin EC  81 mg Oral Daily  . [START ON 08/11/2020] Chlorhexidine Gluconate Cloth  6 each Topical Q0600  . heparin  5,000 Units Subcutaneous Q8H  . sodium chloride flush  3 mL Intravenous Q12H   sodium chloride, sodium chloride, sodium chloride, acetaminophen, albuterol, alteplase, alteplase, dextromethorphan-guaiFENesin, dextrose, heparin, hydrALAZINE, lidocaine (PF), lidocaine-prilocaine, pentafluoroprop-tetrafluoroeth, sodium chloride flush  Assessment/ Plan:  [name] is a 59 y.o.  female well known to our practice,presented to the ED today, with worsening SOB. She also  reported chest pain, which resolved prior to  ED arrival. SpO2 was 83% on arrival, currently 100% on 3L O2 via nasal cannula. Patient has h/o ESRD, on dialysis MWF. We will arrange dialysis for her today.Orders placed.  #ESRD on dialysis MWF Patient received her  previous dialysis treatment on Wednesday She is scheduled to get dialysis today as per outpatient schedule Dialysis orders placed.  # Hypertension Blood pressure readings above goal She is on Amlodipine,Coreg, Hydralazine and Furosemide at   home  #Secondary Hyperparathyroidism Lab Results  Component Value Date   PTH 357 (H) 10/05/2018   CALCIUM 7.7 (L) 08/10/2020   CAION 1.09 (L) 04/13/2020   PHOS 3.1 06/19/2020  Patient is on Sevelamer as per home regimen  # Acute on chronic diastolic CHF Last 2D Echo on 06/11/2020  with EF 50 to 55% Chest Xray on 08/10/2020 IMPRESSION: 1. Findings consistent with volume overload/CHF. Difficult to exclude the possibility of concomitant infection particularly in the LEFT lung base. 2. New LEFT-sided dialysis catheter, tip in the mid to low RIGHT Atrium. Planning dialysis today Respiratory status stable at the moment, with SpO2 100% on 3L O2 Patient has significant peripheral edema  #Anemia of Chronic Kidney Disease Hemoglobin 7.4 Will start Epogen with dialysis   LOS: 0 Kingstin Heims 11/19/20213:44 PM

## 2020-08-10 NOTE — ED Notes (Signed)
Medication Reconciliation Report  For Home History Technicians  HIGHLIGHTS:  1. The patient WAS NOT personally interviewed 2. If not, what was the main source used: PHARMACY RECORDS 3. Does the patient appear to take any anti-coagulation agents (e.g. warfarin, Eliquis or Xarelto): NO 4. Does the patient appear to take any anti-convulsant agents (e.g. divalproex, levetiracetam or phenytoin): NO 5. Does the patient appear to use any insulin products (e.g. Lantus, Novolin or Humalog): NO 6. Does the patient appear to take any "beta-blockers" (e.g. metoprolol, carvedilol or bisoprolol: YES  BARRIERS:  1. Were there any barriers that prevented or complicated the medication reconciliation process: YES 2. If yes, what was the primary barrier encountered: Noncommunicating  3. Does the patient appear compliant with prescribed medications: UNABLE TO DETERMINE 4. Does the patient express any barriers with compliance: UNABLE TO DETERMINE 5. What is the primary barrier the patient reports: None   NOTES:[Include any concerns, remarks or complaints the patient expresses regarding medication therapy. Any observations or other information that might be useful to the treatment team can also be included. Immediate needs or concerns should be referred to the RN or appropriate member of the treatment team.]  Patient was asleep upon attempting to interview. Patient discharged 06/25/2020 from [facility], at which time CALCIUM ACETATE was discontinued. Does not seem to have been resumed.    Colen Darling, CPhT [place] at [facility]. [place], [place] [address] [phone]  ** The above is intended solely for informational and/or communicative purposes. It should in no way be considered an endorsement of any specific treatment, therapy or action. **

## 2020-08-10 NOTE — ED Provider Notes (Signed)
[facility] Emergency Department Provider Note  Time seen: 9:34 AM  I have reviewed the triage vital signs and the nursing notes.   HISTORY  Chief Complaint Shortness of Breath and Chest Pain   HPI [name] is a 59 y.o. female with a past medical history of anemia, anxiety, COPD, depression, diabetes, hypertension, ESRD on HD Monday/Wednesday/Friday, presents to the emergency department for chest pain shortness of breath.  According to the patient she awoke this morning with chest pain and shortness of breath.  Patient states she received her full dialysis session on Wednesday her next dialysis would be today.  Denies any fever, does state slight dry cough this morning.  States he has been experiencing some swelling around her body and in her legs, tenderness in her extremities due to "neuropathy" per patient.    Past Medical History:  Diagnosis Date  . Anemia   . Anginal pain ([place])   . Anxiety   . Arthritis   . Asthma   . Broken wrist   . Bronchitis   . chronic diastolic CHF 0/86/7619  . Chronic kidney disease   . COPD (chronic obstructive pulmonary disease) ([place])   . Coronary artery disease    a. cath 2013: stenting to RCA (report not available); b. cath 2014: LM nl, pLAD 40%, mLAD nl, ost LCx 40%, mid LCx nl, pRCA 30% @ site of prior stent, mRCA 50%  . Depression   . Diabetes mellitus ([place])   . Diabetes mellitus without complication ([place])   . Diabetic neuropathy ([place])   . dialysis 2006  . Diverticulosis   . Dizziness   . Dyspnea   . Elevated lipids   . Environmental and seasonal allergies   . ESRD (end stage renal disease) on dialysis ([place])    M-W-F  . Gastroparesis   . GERD (gastroesophageal reflux disease)   . Headache   . History of anemia due to chronic kidney disease   . History of hiatal hernia   . HOH (hard of hearing)   . Hx of pancreatitis 2015  . Hypertension   . Lower extremity edema   . Mitral regurgitation    a. echo 10/2013:  EF 62%, noWMA, mildly dilated LA, mild to mod MR/TR, GR1DD  . Myocardial infarction ([place])   . Orthopnea   . Parathyroid abnormality ([place])   . Peripheral arterial disease ([place])   . Pneumonia   . Renal cancer ([place])   . Renal insufficiency    Pt is on dialysis on M,W + F.  . Wheezing     Patient Active Problem List   Diagnosis Date Noted  . Obesity (BMI 30-39.9) 06/06/2020  . Anemia 05/31/2020  . Type 1 diabetes mellitus ([place]) 05/31/2020  . End-stage renal disease ([place]) 05/31/2020  . Hypertensive disorder 05/31/2020  . Arm pain 05/21/2020  . Cellulitis of right leg 05/02/2020  . ESRD (end stage renal disease) ([place]) 04/13/2020  . SOBOE (shortness of breath on exertion) 02/08/2020  . Hematoma of arm, left, subsequent encounter 11/24/2019  . Respiratory failure ([place])   . Shortness of breath   . Complication of arteriovenous dialysis fistula 11/13/2019  . PVD (peripheral vascular disease) ([place]) 11/13/2019  . Severe anemia 11/13/2019  . Pressure injury of skin 11/13/2019  . ESRD on hemodialysis ([place])   . Hemorrhagic shock ([place]) 11/11/2019  . Leg pain 10/02/2019  . CAP (community acquired pneumonia) 10/01/2019  . Atherosclerosis of native arteries of extremity with intermittent claudication ([place]) 05/01/2019  .  Right-sided headache   . COPD (chronic obstructive pulmonary disease) ([place]) 10/06/2018  . Syncope 10/04/2018  . Symptomatic anemia 06/18/2018  . Gastroparesis due to DM ([place]) 01/18/2018  . Complication of vascular access for dialysis 12/04/2017  . Osteomyelitis ([place]) 09/30/2017  . Carotid stenosis 06/18/2017  . Bilateral carotid artery stenosis 06/16/2017  . Benign essential HTN 05/19/2017  . CKD (chronic kidney disease) stage 5, GFR less than 15 ml/min (HCC) 05/19/2017  . Hyperlipidemia, mixed 05/19/2017  . Shortness of breath 05/04/2017  . Rash, skin 12/16/2016  . Cellulitis of lower extremity 07/29/2016  . Chronic venous insufficiency 07/29/2016  . Lymphedema  07/29/2016  . TIA (transient ischemic attack) 04/21/2016  . Altered mental status 04/08/2016  . Hyperammonemia ([place]) 04/08/2016  . Elevated troponin 04/08/2016  . Depression 04/08/2016  . Depression, major, recurrent, severe with psychosis ([place]) 04/08/2016  . Blood in stool   . Intractable cyclical vomiting with nausea   . Reflux esophagitis   . Gastritis   . Generalized abdominal pain   . Uncontrollable vomiting   . Major depressive disorder, recurrent episode, moderate ([place]) 03/15/2016  . Adjustment disorder with mixed anxiety and depressed mood 03/15/2016  . Malnutrition of moderate degree 12/01/2015  . Renal mass   . Dyspnea   . Acute renal failure ([place])   . Respiratory failure ([place])   . High temperature 11/14/2015  . Encounter for central line placement   . Encounter for orogastric (OG) tube placement   . Nausea 11/12/2015  . Hyperkalemia 10/03/2015  . Diarrhea, unspecified 07/22/2015  . Pneumonia 05/21/2015  . Hypoglycemia 04/24/2015  . Unresponsiveness 04/24/2015  . Bradycardia 04/24/2015  . Hypothermia 04/24/2015  . Acute respiratory failure ([place]) 04/24/2015  . Acute diastolic CHF (congestive heart failure) ([place]) 04/05/2015  . Diabetic gastroparesis ([place]) 04/05/2015  . Hypokalemia 04/05/2015  . Generalized weakness 04/05/2015  . Acute pulmonary edema ([place]) 04/03/2015  . Nausea and vomiting 04/03/2015  . Hypoglycemia associated with diabetes ([place]) 04/03/2015  . Anemia of chronic disease 04/03/2015  . Secondary hyperparathyroidism ([place]) 04/03/2015  . Pressure ulcer 04/02/2015  . Acute respiratory failure with hypoxia ([place]) 04/01/2015  . Adjustment disorder with anxiety 03/14/2015  . Somatic symptom disorder, mild 03/08/2015  . Coronary artery disease involving native coronary artery of native heart without angina pectoris   . Nausea & vomiting 03/06/2015  . Abdominal pain 03/06/2015  . DM (diabetes mellitus) ([place]) 03/06/2015  . HTN (hypertension) 03/06/2015  .  Gastroparesis 02/24/2015  . Pleural effusion 02/19/2015  . Bacteremia due to Enterococcus 02/19/2015  . End-stage renal disease on hemodialysis ([place]) 02/19/2015  . Diarrhea 08/01/2014    Past Surgical History:  Procedure Laterality Date  . A/V SHUNTOGRAM Left 01/20/2018   Procedure: A/V SHUNTOGRAM;  Surgeon: Algernon Huxley, MD;  Location: [facility];  Service: Cardiovascular;  Laterality: Left;  . ABDOMINAL HYSTERECTOMY  1992  . AMPUTATION TOE Left 10/02/2017   Procedure: AMPUTATION TOE-LEFT GREAT TOE;  Surgeon: Albertine Patricia, DPM;  Location: [facility];  Service: Podiatry;  Laterality: Left;  . APPENDECTOMY    . APPLICATION OF WOUND VAC N/A 11/25/2019   Procedure: APPLICATION OF WOUND VAC;  Surgeon: Katha Cabal, MD;  Location: [facility];  Service: Vascular;  Laterality: N/A;  . ARTERY BIOPSY Right 10/11/2018   Procedure: BIOPSY TEMPORAL ARTERY;  Surgeon: Vickie Epley, MD;  Location: [facility];  Service: General;  Laterality: Right;  . CARDIAC CATHETERIZATION Left 07/26/2015   Procedure: Left Heart Cath and Coronary  Angiography;  Surgeon: Dionisio David, MD;  Location: [facility];  Service: Cardiovascular;  Laterality: Left;  . CATARACT EXTRACTION W/ INTRAOCULAR LENS IMPLANT Right   . CATARACT EXTRACTION W/PHACO Left 03/10/2017   Procedure: CATARACT EXTRACTION PHACO AND INTRAOCULAR LENS PLACEMENT (IOC);  Surgeon: Birder Robson, MD;  Location: [facility];  Service: Ophthalmology;  Laterality: Left;  [place] 00:51.9 AP% 14.2 CDE 7.39 fluid pack lot # [id number]  . CENTRAL LINE INSERTION Right 11/11/2019   Procedure: CENTRAL LINE INSERTION;  Surgeon: Katha Cabal, MD;  Location: [facility];  Service: Vascular;  Laterality: Right;  . CENTRAL LINE INSERTION  11/25/2019   Procedure: CENTRAL LINE INSERTION;  Surgeon: Katha Cabal, MD;  Location: [facility];  Service: Vascular;;  . CHOLECYSTECTOMY    . COLONOSCOPY WITH PROPOFOL N/A 08/12/2016   Procedure: COLONOSCOPY  WITH PROPOFOL;  Surgeon: Lollie Sails, MD;  Location[facility];  Service: Endoscopy;  Laterality: N/A;  . DIALYSIS FISTULA CREATION Left    upper arm  . dialysis grafts    . DIALYSIS/PERMA CATHETER INSERTION N/A 11/14/2019   Procedure: DIALYSIS/PERMA CATHETER INSERTION;  Surgeon: Algernon Huxley, MD;  Location: [facility];  Service: Cardiovascular;  Laterality: N/A;  . DIALYSIS/PERMA CATHETER INSERTION N/A 02/03/2020   Procedure: DIALYSIS/PERMA CATHETER INSERTION;  Surgeon: Katha Cabal, MD;  Location: [facility];  Service: Cardiovascular;  Laterality: N/A;  . DIALYSIS/PERMA CATHETER INSERTION N/A 06/19/2020   Procedure: DIALYSIS/PERMA CATHETER INSERTION;  Surgeon: Katha Cabal, MD;  Location: [facility];  Service: Cardiovascular;  Laterality: N/A;  . DIALYSIS/PERMA CATHETER REMOVAL N/A 05/25/2020   Procedure: DIALYSIS/PERMA CATHETER REMOVAL;  Surgeon: Katha Cabal, MD;  Location: [facility];  Service: Cardiovascular;  Laterality: N/A;  . ESOPHAGOGASTRODUODENOSCOPY N/A 03/08/2015   Procedure: ESOPHAGOGASTRODUODENOSCOPY (EGD);  Surgeon: Manya Silvas, MD;  Location: [facility];  Service: Endoscopy;  Laterality: N/A;  . ESOPHAGOGASTRODUODENOSCOPY (EGD) WITH PROPOFOL N/A 03/18/2016   Procedure: ESOPHAGOGASTRODUODENOSCOPY (EGD) WITH PROPOFOL;  Surgeon: [name], MD;  Location: [facility];  Service: Endoscopy;  Laterality: N/A;  . EYE SURGERY Right 2018  . FECAL TRANSPLANT N/A 08/23/2015   Procedure: FECAL TRANSPLANT;  Surgeon: Manya Silvas, MD;  Location: [facility]COPY;  Service: Endoscopy;  Laterality: N/A;  . HAND SURGERY Bilateral   . HEMATOMA EVACUATION Left 11/25/2019   Procedure: EVACUATION HEMATOMA;  Surgeon: Katha Cabal, MD;  Location: [facility];  Service: Vascular;  Laterality: Left;  . I & D EXTREMITY Left 11/25/2019   Procedure: IRRIGATION AND DEBRIDEMENT EXTREMITY;  Surgeon: Katha Cabal, MD;   Location: [facility];  Service: Vascular;  Laterality: Left;  . IR FLUORO GUIDE CV LINE RIGHT  04/06/2020  . IR RADIOLOGIST EVAL & MGMT  07/28/2019  . IR RADIOLOGIST EVAL & MGMT  08/11/2019  . LIGATION OF ARTERIOVENOUS  FISTULA Left 11/11/2019  . LIGATION OF ARTERIOVENOUS  FISTULA Left 11/11/2019   Procedure: LIGATION OF ARTERIOVENOUS  FISTULA;  Surgeon: Katha Cabal, MD;  Location: [facility];  Service: Vascular;  Laterality: Left;  . LIGATIONS OF HERO GRAFT Right 06/13/2020   Procedure: LIGATION / REMOVAL OF RIGHT HERO GRAFT;  Surgeon: Katha Cabal, MD;  Location: [facility];  Service: Vascular;  Laterality: Right;  . PERIPHERAL VASCULAR CATHETERIZATION N/A 12/20/2015   Procedure: Thrombectomy of dialysis access versus permcath placement;  Surgeon: Algernon Huxley, MD;  Location: [facility];  Service: Cardiovascular;  Laterality: N/A;  .  PERIPHERAL VASCULAR CATHETERIZATION N/A 12/20/2015   Procedure: A/V Shunt Intervention;  Surgeon: Algernon Huxley, MD;  Location: [facility];  Service: Cardiovascular;  Laterality: N/A;  . PERIPHERAL VASCULAR CATHETERIZATION N/A 12/20/2015   Procedure: A/V Shuntogram/Fistulagram;  Surgeon: Algernon Huxley, MD;  Location: [facility];  Service: Cardiovascular;  Laterality: N/A;  . PERIPHERAL VASCULAR CATHETERIZATION N/A 01/02/2016   Procedure: A/V Shuntogram/Fistulagram;  Surgeon: Algernon Huxley, MD;  Location: [facility];  Service: Cardiovascular;  Laterality: N/A;  . PERIPHERAL VASCULAR CATHETERIZATION N/A 01/02/2016   Procedure: A/V Shunt Intervention;  Surgeon: Algernon Huxley, MD;  Location: [facility];  Service: Cardiovascular;  Laterality: N/A;  . TEE WITHOUT CARDIOVERSION N/A 06/11/2020   Procedure: TRANSESOPHAGEAL ECHOCARDIOGRAM (TEE);  Surgeon: Teodoro Spray, MD;  Location: [facility];  Service: Cardiovascular;  Laterality: N/A;  . UPPER EXTREMITY VENOGRAPHY Right 01/18/2020   Procedure: UPPER EXTREMITY VENOGRAPHY;  Surgeon:  Katha Cabal, MD;  Location: [facility];  Service: Cardiovascular;  Laterality: Right;  Marland Kitchen VASCULAR ACCESS DEVICE INSERTION Right 04/13/2020   Procedure: INSERTION OF HERO VASCULAR ACCESS DEVICE (GRAFT);  Surgeon: Katha Cabal, MD;  Location: [facility];  Service: Vascular;  Laterality: Right;    Prior to Admission medications   Medication Sig Start Date End Date Taking? Authorizing Provider  albuterol (PROVENTIL) (2.5 MG/3ML) 0.083% nebulizer solution Take 2.5 mg by nebulization every 4 (four) hours as needed for wheezing or shortness of breath.    [provider]  albuterol (VENTOLIN HFA) 108 (90 Base) MCG/ACT inhaler Inhale 2 puffs into the lungs every 4 (four) hours as needed for wheezing or shortness of breath. 11/09/19   [provider]  alum & mag hydroxide-simeth (MAALOX/MYLANTA) 200-200-20 MG/5ML suspension Take 15 mLs by mouth every 6 (six) hours as needed for indigestion or heartburn. 06/25/20   Enzo Bi, MD  amLODipine (NORVASC) 10 MG tablet Take 1 tablet (10 mg total) by mouth daily. 06/25/20   Enzo Bi, MD  ammonium lactate (LAC-HYDRIN) 12 % lotion Apply 1 application topically 2 (two) times daily as needed for dry skin.     [provider]  Ascorbic Acid (VITA-C PO) Take 1 tablet by mouth daily.    [provider]  aspirin EC 81 MG tablet Take 81 mg by mouth daily.    [provider]  atorvastatin (LIPITOR) 20 MG tablet Take 20 mg by mouth every evening.  10/24/19   [provider]  calcium carbonate (TUMS - DOSED IN MG ELEMENTAL CALCIUM) 500 MG chewable tablet Chew 2.5 tablets (500 mg of elemental calcium total) by mouth every 6 (six) hours as needed for indigestion. 06/25/20   Enzo Bi, MD  carvedilol (COREG) 6.25 MG tablet Take 6.25 mg by mouth 2 (two) times daily with a meal.    [provider]  Cholecalciferol (VITAMIN D3) 25 MCG (1000 UT) CAPS Take 1,000 Units by mouth daily.     [provider]  cholestyramine light (PREVALITE) 4 g packet Take 4 g by mouth 2 (two) times daily.    [provider]  CREON 12000 units CPEP capsule Take 12,000-36,000 Units by mouth See admin instructions. Take 3 capsules (36,000u) by mouth three times daily with meals and take 1 capsule (12,000u) by mouth daily with a snack 10/21/19   [provider]  cyanocobalamin 1000 MCG tablet Take 1,000 mcg by mouth daily.     [provider]  diphenhydrAMINE (  BENADRYL) 25 MG tablet Take 25 mg by mouth every 6 (six) hours as needed for itching.    [provider]  epoetin alfa (EPOGEN) 10000 UNIT/ML injection Inject 1 mL (10,000 Units total) into the vein every Monday, Wednesday, and Friday with hemodialysis. 06/27/20   Enzo Bi, MD  feeding supplement, ENSURE ENLIVE, (ENSURE ENLIVE) LIQD Take 237 mLs by mouth daily. 06/26/20   Enzo Bi, MD  Ferrous Sulfate (IRON) 325 (65 Fe) MG TABS Take 325 mg by mouth daily.     [provider]  furosemide (LASIX) 80 MG tablet Take 80 mg by mouth daily. 05/08/20   [provider]  gabapentin (NEURONTIN) 100 MG capsule Take 100 mg by mouth every evening.     [provider]  hydrALAZINE (APRESOLINE) 50 MG tablet Take 1 tablet (50 mg total) by mouth every 8 (eight) hours. 06/25/20   Enzo Bi, MD  hydrOXYzine (ATARAX/VISTARIL) 25 MG tablet Take 25 mg by mouth daily.     [provider]  multivitamin (RENA-VIT) TABS tablet Take 1 tablet by mouth daily.     [provider]  mupirocin ointment (BACTROBAN) 2 % Apply 1 application topically daily as needed (leg rash).  11/09/19   [provider]  nitroGLYCERIN (NITROSTAT) 0.4 MG SL tablet Place 0.4 mg under the tongue every 5 (five) minutes as needed for chest pain.     [provider]  Omega-3 300 MG CAPS Take 300 mg by mouth 2 (two) times daily. Every other day opposite omega XL    [provider]  pantoprazole (PROTONIX) 40 MG tablet  Take 40 mg by mouth daily. 08/28/19   [provider]  polyethylene glycol (MIRALAX / GLYCOLAX) 17 g packet Take 17 g by mouth daily. 06/25/20   Enzo Bi, MD  Probiotic Product (PROBIOTIC PO) Take 1 capsule by mouth daily.    [provider]  promethazine (PHENERGAN) 25 MG tablet Take 1 tablet (25 mg total) by mouth every 6 (six) hours as needed for nausea or vomiting. 06/25/20   Enzo Bi, MD  sevelamer carbonate (RENVELA) 800 MG tablet Take 800 mg by mouth 4 (four) times daily. (at meals and with a snack)    [provider]  traMADol (ULTRAM) 50 MG tablet Take 1 tablet (50 mg total) by mouth every 8 (eight) hours as needed for moderate pain or severe pain. 06/25/20   Enzo Bi, MD    Allergies  Allergen Reactions  . Ace Inhibitors Swelling and Anaphylaxis  . Ativan [Lorazepam] Other (See Comments)    Reaction:Hallucinations and headaches  . Compazine [Prochlorperazine Edisylate] Anaphylaxis, Nausea And Vomiting and Other (See Comments)    Other reaction(s): dystonia from this vs. Reglan, 23 Jul - patient relates that she takes promethazine frequently with no problems  . Sumatriptan Succinate Other (See Comments)    Other reaction(s): delirium and hallucinations per [facility] records  . Zofran [Ondansetron] Nausea And Vomiting    Per pt. she is allergic to zofran or will experience adverse reaction like hallucination   . Losartan Nausea Only  . Prochlorperazine Other (See Comments)    Reaction:  Unknown . Patient does not remember reaction but she does have vertigo and anxiety along with n and v at times. Could be used to treat any of these   . Reglan [Metoclopramide] Other (See Comments)    Per patient her Dr. Evelina Bucy her off it   . Scopolamine Other (See Comments)    Dizziness, also has  vertigo already  . Tape Rash    Plastic tape causes rash  . Tapentadol Rash    Family History  Problem Relation Age of Onset  . Kidney disease Mother   . Diabetes Mother   .  Cancer Father   . Kidney disease Sister     Social History Social History   Tobacco Use  . Smoking status: Never Smoker  . Smokeless tobacco: Never Used  Vaping Use  . Vaping Use: Never used  Substance Use Topics  . Alcohol use: Not Currently    Comment: glass wine week per pt  . Drug use: Yes    Types: Marijuana    Comment: once a day    Review of Systems Constitutional: Negative for fever Cardiovascular: Moderate dull chest pain. Respiratory: Positive for shortness of breath, dry cough this morning. Gastrointestinal: Negative for abdominal pain, vomiting  Musculoskeletal: Swelling in extremities, tenderness in extremities Skin: Negative for skin complaints  Neurological: Negative for headache All other ROS negative  ____________________________________________   PHYSICAL EXAM:  VITAL SIGNS: ED Triage Vitals  Enc Vitals Group     BP 08/10/20 0923 119/88     Pulse Rate 08/10/20 0923 74     Resp 08/10/20 0923 20     Temp 08/10/20 0923 99 F (37.2 C)     Temp src --      SpO2 08/10/20 0923 95 %     Weight 08/10/20 0927 177 lb 0.5 oz (80.3 kg)     Height 08/10/20 0927 5\' 5"  (1.651 m)     Head Circumference --      Peak Flow --      Pain Score 08/10/20 0927 7     Pain Loc --      Pain Edu? --      Excl. in [place]? --    Constitutional: Awake alert.  Mild distress, tearful saying her chest hurts and her whole body hurts. Eyes: Normal exam ENT      Head: Normocephalic and atraumatic.      Mouth/Throat: Mucous membranes are moist. Cardiovascular: Normal rate, regular rhythm.  Respiratory: Normal respiratory effort without tachypnea nor retractions. Breath sounds are clear without obvious wheeze rales or rhonchi. Gastrointestinal: Soft and nontender. No distention. Musculoskeletal: States tenderness to palpation in all extremities.  Patient does have 1-2+ lower extremity edema. Neurologic:  Normal speech and language. No gross focal neurologic deficits  Skin:  Skin  is warm, dry and intact.  Psychiatric: Mood and affect are normal.   ____________________________________________    EKG  EKG viewed and interpreted by myself shows a sinus rhythm at 75 bpm with a narrow QRS, normal axis, normal intervals, nonspecific ST changes  ____________________________________________    RADIOLOGY  Chest x-ray shows fluid overload.  ____________________________________________   INITIAL IMPRESSION / ASSESSMENT AND PLAN / ED COURSE  Pertinent labs & imaging results that were available during my care of the patient were reviewed by me and considered in my medical decision making (see chart for details).   Patient presents to the emergency department for chest pain shortness of breath.  Patient is a dialysis patient Monday/Wednesday/Friday.  Denies missing any dialysis was scheduled for today.  Patient's vital signs overall reassuring blood pressure 119/88 satting 95% on room air.  We will obtain labs including cardiac enzymes, chest x-ray treat pain and continue to closely monitor.  Differential this time would include fluid overload, CHF, ACS, pneumonia or Covid.  Difficult IV placement.  I performed an ultrasound-guided  IV.  Chest x-ray shows fluid overload.  Patient desatted to 87% and placed on 2 L nasal cannula satting in the upper 90s.  I talked to Dr. Holley Raring of nephrology.  We will admit to the hospitalist service he will arrange dialysis.      Peripheral IV insertion by physician Indication: Multiple failed attempts by nursing staff, need for IV access and/or blood samples for workup Performed under continuous real-time ultrasound visualization Area cleaned with chlorhexidine. 20-gauge IV successfully placed in the right antecubital fossa. 1 attempt, no complications, EBL 0.    LOUIS GAW was evaluated in Emergency Department on 08/10/2020 for the symptoms described in the history of present illness. She was evaluated in the context of the  global COVID-19 pandemic, which necessitated consideration that the patient might be at risk for infection with the SARS-CoV-2 virus that causes COVID-19. Institutional protocols and algorithms that pertain to the evaluation of patients at risk for COVID-19 are in a state of rapid change based on information released by regulatory bodies including the CDC and federal and state organizations. These policies and algorithms were followed during the patient's care in the ED.  ____________________________________________   FINAL CLINICAL IMPRESSION(S) / ED DIAGNOSES  Chest pain Dyspnea Fluid overload   Harvest Dark, MD 08/10/20 1307

## 2020-08-10 NOTE — Progress Notes (Signed)
[facility]  After 1 hour 45 minutes, pt c/o of left sided chest pain. Goal reduced and pump speed reduced but pain persisted. Tx terminated and blood returned. Stat EKG ordered, Dr Holley Raring informed. Pt taken back to room by floor nurse

## 2020-08-10 NOTE — ED Notes (Signed)
[name] from dialysis took report for her treatment today.

## 2020-08-10 NOTE — ED Notes (Signed)
Attempted to call report. Secretary called report

## 2020-08-10 NOTE — ED Notes (Signed)
O2 sensor had been removed by patient. Replaced on finger, o2 sat was 83% on room air. Placed patient on 3L [place]. Patient quickly came back up to 98%.

## 2020-08-11 ENCOUNTER — Observation Stay: Payer: Medicare HMO

## 2020-08-11 DIAGNOSIS — I5033 Acute on chronic diastolic (congestive) heart failure: Secondary | ICD-10-CM | POA: Diagnosis present

## 2020-08-11 DIAGNOSIS — R001 Bradycardia, unspecified: Secondary | ICD-10-CM | POA: Diagnosis not present

## 2020-08-11 DIAGNOSIS — J449 Chronic obstructive pulmonary disease, unspecified: Secondary | ICD-10-CM | POA: Diagnosis present

## 2020-08-11 DIAGNOSIS — N2581 Secondary hyperparathyroidism of renal origin: Secondary | ICD-10-CM | POA: Diagnosis present

## 2020-08-11 DIAGNOSIS — Z8673 Personal history of transient ischemic attack (TIA), and cerebral infarction without residual deficits: Secondary | ICD-10-CM | POA: Diagnosis not present

## 2020-08-11 DIAGNOSIS — R112 Nausea with vomiting, unspecified: Secondary | ICD-10-CM | POA: Diagnosis not present

## 2020-08-11 DIAGNOSIS — Z20822 Contact with and (suspected) exposure to covid-19: Secondary | ICD-10-CM | POA: Diagnosis present

## 2020-08-11 DIAGNOSIS — I501 Left ventricular failure: Secondary | ICD-10-CM | POA: Diagnosis not present

## 2020-08-11 DIAGNOSIS — F32A Depression, unspecified: Secondary | ICD-10-CM | POA: Diagnosis present

## 2020-08-11 DIAGNOSIS — E871 Hypo-osmolality and hyponatremia: Secondary | ICD-10-CM | POA: Diagnosis not present

## 2020-08-11 DIAGNOSIS — D631 Anemia in chronic kidney disease: Secondary | ICD-10-CM | POA: Diagnosis present

## 2020-08-11 DIAGNOSIS — E1141 Type 2 diabetes mellitus with diabetic mononeuropathy: Secondary | ICD-10-CM | POA: Diagnosis present

## 2020-08-11 DIAGNOSIS — Z992 Dependence on renal dialysis: Secondary | ICD-10-CM | POA: Diagnosis not present

## 2020-08-11 DIAGNOSIS — E876 Hypokalemia: Secondary | ICD-10-CM | POA: Diagnosis not present

## 2020-08-11 DIAGNOSIS — E162 Hypoglycemia, unspecified: Secondary | ICD-10-CM | POA: Diagnosis not present

## 2020-08-11 DIAGNOSIS — M4804 Spinal stenosis, thoracic region: Secondary | ICD-10-CM | POA: Diagnosis not present

## 2020-08-11 DIAGNOSIS — E1143 Type 2 diabetes mellitus with diabetic autonomic (poly)neuropathy: Secondary | ICD-10-CM | POA: Diagnosis present

## 2020-08-11 DIAGNOSIS — Z7189 Other specified counseling: Secondary | ICD-10-CM | POA: Diagnosis not present

## 2020-08-11 DIAGNOSIS — Z85528 Personal history of other malignant neoplasm of kidney: Secondary | ICD-10-CM | POA: Diagnosis not present

## 2020-08-11 DIAGNOSIS — J810 Acute pulmonary edema: Secondary | ICD-10-CM | POA: Diagnosis not present

## 2020-08-11 DIAGNOSIS — D638 Anemia in other chronic diseases classified elsewhere: Secondary | ICD-10-CM | POA: Diagnosis not present

## 2020-08-11 DIAGNOSIS — E1122 Type 2 diabetes mellitus with diabetic chronic kidney disease: Secondary | ICD-10-CM | POA: Diagnosis present

## 2020-08-11 DIAGNOSIS — N186 End stage renal disease: Secondary | ICD-10-CM | POA: Diagnosis not present

## 2020-08-11 DIAGNOSIS — I2510 Atherosclerotic heart disease of native coronary artery without angina pectoris: Secondary | ICD-10-CM | POA: Diagnosis present

## 2020-08-11 DIAGNOSIS — E1151 Type 2 diabetes mellitus with diabetic peripheral angiopathy without gangrene: Secondary | ICD-10-CM | POA: Diagnosis present

## 2020-08-11 DIAGNOSIS — E8770 Fluid overload, unspecified: Secondary | ICD-10-CM | POA: Diagnosis not present

## 2020-08-11 DIAGNOSIS — M4644 Discitis, unspecified, thoracic region: Secondary | ICD-10-CM | POA: Diagnosis not present

## 2020-08-11 DIAGNOSIS — M4624 Osteomyelitis of vertebra, thoracic region: Secondary | ICD-10-CM | POA: Diagnosis present

## 2020-08-11 DIAGNOSIS — K861 Other chronic pancreatitis: Secondary | ICD-10-CM | POA: Diagnosis present

## 2020-08-11 DIAGNOSIS — I5030 Unspecified diastolic (congestive) heart failure: Secondary | ICD-10-CM | POA: Diagnosis not present

## 2020-08-11 DIAGNOSIS — I132 Hypertensive heart and chronic kidney disease with heart failure and with stage 5 chronic kidney disease, or end stage renal disease: Secondary | ICD-10-CM | POA: Diagnosis present

## 2020-08-11 DIAGNOSIS — F419 Anxiety disorder, unspecified: Secondary | ICD-10-CM | POA: Diagnosis present

## 2020-08-11 DIAGNOSIS — K529 Noninfective gastroenteritis and colitis, unspecified: Secondary | ICD-10-CM | POA: Diagnosis present

## 2020-08-11 DIAGNOSIS — I120 Hypertensive chronic kidney disease with stage 5 chronic kidney disease or end stage renal disease: Secondary | ICD-10-CM | POA: Diagnosis not present

## 2020-08-11 DIAGNOSIS — E785 Hyperlipidemia, unspecified: Secondary | ICD-10-CM | POA: Diagnosis present

## 2020-08-11 DIAGNOSIS — I248 Other forms of acute ischemic heart disease: Secondary | ICD-10-CM | POA: Diagnosis present

## 2020-08-11 DIAGNOSIS — J9601 Acute respiratory failure with hypoxia: Secondary | ICD-10-CM | POA: Diagnosis not present

## 2020-08-11 DIAGNOSIS — D649 Anemia, unspecified: Secondary | ICD-10-CM | POA: Diagnosis not present

## 2020-08-11 DIAGNOSIS — E11649 Type 2 diabetes mellitus with hypoglycemia without coma: Secondary | ICD-10-CM | POA: Diagnosis not present

## 2020-08-11 LAB — CBC
HCT: 22.1 % — ABNORMAL LOW (ref 36.0–46.0)
Hemoglobin: 7.1 g/dL — ABNORMAL LOW (ref 12.0–15.0)
MCH: 28.6 pg (ref 26.0–34.0)
MCHC: 32.1 g/dL (ref 30.0–36.0)
MCV: 89.1 fL (ref 80.0–100.0)
Platelets: 107 K/uL — ABNORMAL LOW (ref 150–400)
RBC: 2.48 MIL/uL — ABNORMAL LOW (ref 3.87–5.11)
RDW: 20 % — ABNORMAL HIGH (ref 11.5–15.5)
WBC: 3.5 K/uL — ABNORMAL LOW (ref 4.0–10.5)
nRBC: 0 % (ref 0.0–0.2)

## 2020-08-11 LAB — GLUCOSE, CAPILLARY
Glucose-Capillary: 120 mg/dL — ABNORMAL HIGH (ref 70–99)
Glucose-Capillary: 139 mg/dL — ABNORMAL HIGH (ref 70–99)
Glucose-Capillary: 56 mg/dL — ABNORMAL LOW (ref 70–99)
Glucose-Capillary: 76 mg/dL (ref 70–99)
Glucose-Capillary: 78 mg/dL (ref 70–99)
Glucose-Capillary: 80 mg/dL (ref 70–99)
Glucose-Capillary: 82 mg/dL (ref 70–99)

## 2020-08-11 LAB — BASIC METABOLIC PANEL
Anion gap: 9 (ref 5–15)
BUN: 6 mg/dL — ABNORMAL LOW (ref 8–23)
CO2: 29 mmol/L (ref 22–32)
Calcium: 7.5 mg/dL — ABNORMAL LOW (ref 8.9–10.3)
Chloride: 99 mmol/L (ref 98–111)
Creatinine, Ser: 3.06 mg/dL — ABNORMAL HIGH (ref 0.44–1.00)
GFR, Estimated: 17 mL/min — ABNORMAL LOW (ref >60)
Glucose, Bld: 64 mg/dL — ABNORMAL LOW (ref 70–99)
Potassium: 3.3 mmol/L — ABNORMAL LOW (ref 3.5–5.1)
Sodium: 137 mmol/L (ref 135–145)

## 2020-08-11 LAB — LIPID PANEL
Cholesterol: 107 mg/dL (ref 0–200)
HDL: 69 mg/dL
LDL Cholesterol: 29 mg/dL (ref 0–99)
Total CHOL/HDL Ratio: 1.6 ratio
Triglycerides: 43 mg/dL
VLDL: 9 mg/dL (ref 0–40)

## 2020-08-11 LAB — MAGNESIUM: Magnesium: 1.8 mg/dL (ref 1.7–2.4)

## 2020-08-11 LAB — HEMOGLOBIN A1C
Hgb A1c MFr Bld: 3.9 % — ABNORMAL LOW (ref 4.8–5.6)
Mean Plasma Glucose: 65.23 mg/dL

## 2020-08-11 LAB — PHOSPHORUS: Phosphorus: 2.4 mg/dL — ABNORMAL LOW (ref 2.5–4.6)

## 2020-08-11 MED ORDER — DEXTROSE 10 % IV SOLN: INTRAVENOUS | Status: DC

## 2020-08-11 MED ORDER — CARVEDILOL 6.25 MG PO TABS
3.1250 mg | ORAL_TABLET | Freq: Two times a day (BID) | ORAL | Status: DC
  Administered 2020-08-12 – 2020-08-17 (×9): 3.125 mg via ORAL
  Filled 2020-08-11 (×10): qty 1

## 2020-08-11 NOTE — Plan of Care (Signed)

## 2020-08-11 NOTE — Progress Notes (Signed)
Notified by tele that patients heart rate was in the 50s, went to check on patient she was nauseated and dizzy. After a moment everything subsided. MD aware and at bedside. No new orders. Will continue to monitor.

## 2020-08-11 NOTE — Progress Notes (Signed)
Cross Cover Note Called by radiologist regarding CT results and concern for osteomyelitis on thoracic spine.  Follow up MRI also shows/confirms discitis T7-8 . However patient is without overt symptoms of infection as she has normal white count and no fever. 2 sets of blood cultures were drawn but in=iiation of antibiotics at this time not done An ultrasound of right upper extremity ordered for follow up to CT scan report venous outlet obstruction'

## 2020-08-11 NOTE — Plan of Care (Signed)

## 2020-08-11 NOTE — Plan of Care (Addendum)
  Problem: Education: Goal: Knowledge of General Education information will improve Description: Including pain rating scale, medication(s)/side effects and non-pharmacologic comfort measures Outcome: Progressing   Problem: Health Behavior/Discharge Planning: Goal: Ability to manage health-related needs will improve Outcome: Progressing   Problem: Clinical Measurements: Goal: Ability to maintain clinical measurements within normal limits will improve Outcome: Progressing Goal: Will remain free from infection Outcome: Progressing Goal: Diagnostic test results will improve Outcome: Progressing Goal: Respiratory complications will improve Outcome: Progressing Goal: Cardiovascular complication will be avoided Outcome: Progressing   Problem: Activity: Goal: Risk for activity intolerance will decrease Outcome: Progressing   Problem: Nutrition: Goal: Adequate nutrition will be maintained Outcome: Progressing   Problem: Coping: Goal: Level of anxiety will decrease Outcome: Progressing   Problem: Elimination: Goal: Will not experience complications related to bowel motility Outcome: Progressing Goal: Will not experience complications related to urinary retention Outcome: Progressing   Problem: Pain Managment: Goal: General experience of comfort will improve Outcome: Progressing   Problem: Safety: Goal: Ability to remain free from injury will improve Outcome: Progressing   Problem: Skin Integrity: Goal: Risk for impaired skin integrity will decrease Outcome: Progressing    [name]/O x4, went down for CT chest this evening and then was refusing her lab work and her MRI tonight because she was tired and didn't want to be constantly woken up. CCMD called to report 5 beat run of v- tach around 0017. Patient asymptomatic with the event. Hassan Rowan, NP. Went to speak with patient and patient now agreeable to all treatments. Lab work showed blood sugar of 64 and K of 3.3 Brenda aware.   Ensure given to patient for blood sugar. Morphine given for leg pain which patient says helped reduce the pain. Will continue with the current plan of care.    Patient complaining that it hurts when she takes a deep breath. Rechecked blood sugar shows 56. Hassan Rowan aware of sugar and pain. Orange juice given at this time, dextrose ordered, morphine given for pain.

## 2020-08-11 NOTE — Progress Notes (Addendum)
PROGRESS NOTE   [name]  SLH:[id number] DOB: [birth year] DOA: 08/10/2020 PCP: [facility]  Brief Narrative:  59 year old black female ESRD MWF HD, HFpEF with underlying CAD HTN with HFpEF NYHA class III DM TY 2 COPD/asthma Depression/anxiety MGUS Prior renal cell carcinoma followed by urology Dr Erlene Quan under surveillance-apparently offered operative procedure previously which patient declined Prior osteomyelitis with great toe amputation 09/2007 EGD 2017 gastropathy gastritis, colonoscopy 1117 adenomatous hyperplastic polyps History of carotid stenosis previously aspirin and Plavix which was discontinued Complicated dialysis access issues-  Shock secondary to bleeding left upper extremity AV fistula 10/2019  previously femoral PermCath removed 05/25/2020  Right arm prosthetic AV graft placed 04/13/2020 Dr. Shirlee Latch  Recent admission 9/11 through 10/42020 with fever chills SOP volume overload-Enterococcus faecalis considered AV graft infection with abscess--developed cardiac arrest during TEE 9/20-had removal RUE AV graft + right brachial endarterectomy--transfused X 4 units-extubated DC home on  vancomycin X 4 weeks end date 07/19/2020 for bacteremia  Re-presented 11/19 [facility] from home nonspecific S OB, CP, lower extremity swelling-desatted to 87% placed on 2 L nasal IJ access placed in ED WBC 4 troponin VIII potassium 2.9-CXR fluid overload pulmonary edema  Assessment & Plan:   Principal Problem:   Acute pulmonary edema (HCC) Active Problems:   End-stage renal disease on hemodialysis (HCC)   Acute respiratory failure with hypoxia (HCC)   Acute on chronic diastolic CHF (congestive heart failure) (HCC)   Hypokalemia   Hypoglycemia   TIA (transient ischemic attack)   Benign essential HTN   Anemia in ESRD (end-stage renal disease) (HCC)   Fluid overload   HLD (hyperlipidemia)   Chest pain   CAD (coronary artery disease)   GERD  (gastroesophageal reflux disease)   1. Acute respiratory failure 2/2 acute HFpEF exacerbation a. Mild exacerbation seems to be comfortable off oxygen and talking with me in full sentences b. Obtain desat screen to see if needs oxygen c. Resume Lasix 80 daily and adjust as needed as she still makes some urine apparently 2. Chest pain  3. Sinus bradycardia---transient and likely secondary to patient feeling nauseous-monitor trends on telemetry have cut back dose of Coreg from 6.25-3.125 a. Unlikely to be cardiogenic b. Situational and likely related to her left dialysis access c. She will need discussion with her vascular surgeon regarding the same 4. ?  T7/T8 osteomyelitis, paravertebral abscess found on MRI a. Could have osteomyelitis unsure this also spread from her small right sided renal cell masses that is still present b. Patient had blood cultures on admission that will need to be followed I have elected not to start antibiotics given unclarity of diagnosis c. May require central line and ID input in the next several days 5. Hypoglycemia to the 50s a. Probably secondary to not eating patient not on any hypoglycemic agents b. Monitor trends and keep on 50 cc/h of D5 for the next day but then discontinue given her ESRD state 6. ?  Thoracic outlet obstruction a. Patient denies numbness in right upper extremity, has no thenar wasting on exam and has no paresthesia at this time in right arm b. Venous ultrasound 11/20 - for any DVT no further work-up c. Hold off on further work-up 7. Anemia renal disease 8. HD MWF ESRD 9. Hypocalcemia on admission 10. Very poor targets for dialysis with complicated vascular access issues in the past a. Defer to nephrology/vascular surgery further planning work-up 11. Complicated grief, situational anxiety a. Long discussions as per HPI  12. Prior carotid stenosis a. Not really a candidate for repair  DVT prophylaxis: Lovenox Code Status: Full Family  Communication: I was not able to talk to her son today Disposition:  Status is: Observation  The patient will require care spanning > 2 midnights and should be moved to inpatient because: Persistent severe electrolyte disturbances, Altered mental status and Unsafe d/c plan  Dispo: The patient is from: Home              Anticipated d/c is to: SNF              Anticipated d/c date is: 2 days              Patient currently is not medically stable to d/c.       Consultants:   Nephrology  Procedures: MRIs  Antimicrobials: None yet   Subjective:  quite anxious and quite labile at times is in pain to the chest Seems to have a lot of underlying thoughts about her illnesses and appears despondent We talked to some degree about her having a possible infection in her back versus other things going on and she told me that she had elected to wait on cryotherapy that was planned by Dr. Erlene Quan She is amenable to talk with palliative care and with chaplain for comfort but also to discuss goals of care  Objective: Vitals:   08/10/20 2045 08/10/20 2102 08/11/20 0010 08/11/20 0534  BP: (!) 166/65  137/85 136/63  Pulse: 64  63 68  Resp: 18  18   Temp: 98.9 F (37.2 C)  98.9 F (37.2 C) 98.6 F (37 C)  TempSrc: Oral  Oral Oral  SpO2: 100% 100% 97% 91%  Weight:    77.1 kg  Height:        Intake/Output Summary (Last 24 hours) at 08/11/2020 0755 Last data filed at 08/11/2020 0631 Gross per 24 hour  Intake 326.46 ml  Output 1101 ml  Net -774.54 ml   Filed Weights   08/10/20 0927 08/10/20 1853 08/11/20 0534  Weight: 80.3 kg 80.3 kg 77.1 kg    Examination:  Awake alert coherent no distress EOMI NCAT  CTA B no added sound no rales no rhonchi S1-S2 developed some bradycardia as low as 30 but was nauseous at the time heart rate is now back in the 50s Abdomen soft no rebound no guarding Neurologically intact no focal deficit   Data Reviewed: I have personally reviewed following  labs and imaging studies Potassium 2.9-->3.3 WBC 3.5 Hemoglobin down 7.4-->7.1 (baseline between 7 and 8)   Radiology Studies: CT ANGIO CHEST PE W OR WO CONTRAST  Addendum Date: 08/10/2020   ADDENDUM REPORT: 08/10/2020 22:32 ADDENDUM: Critical Value/emergent results were called by telephone at the time of addendum submission on 08/10/2020 at 10:32 pm to provider NP Randol Kern, who verbally acknowledged these results. Electronically Signed   By: Lovena Le M.D.   On: 08/10/2020 22:32   Result Date: 08/10/2020 CLINICAL DATA:  PE suspected, history of ESRD, CHF, CAD, and renal cell carcinoma EXAM: CT ANGIOGRAPHY CHEST WITH CONTRAST TECHNIQUE: Multidetector CT imaging of the chest was performed using the standard protocol during bolus administration of intravenous contrast. Multiplanar CT image reconstructions and MIPs were obtained to evaluate the vascular anatomy. CONTRAST:  60mL OMNIPAQUE IOHEXOL 350 MG/ML SOLN COMPARISON:  CT 06/11/2020 FINDINGS: Cardiovascular: Satisfactory opacification the pulmonary arteries to the segmental level. No central pulmonary artery filling defects are identified. Central pulmonary arteries are enlarged. May reflect some  chronic pulmonary artery hypertension given similar to prior. Cardiomegaly with four-chamber enlargement. Small volume pericardial effusion and fluid in the pericardial recesses. Suboptimal opacification of the aorta for luminal assessment. Atherosclerotic plaque within the normal caliber aorta. No hyperdense mural thickening or plaque displacement. No clear acute luminal abnormality is seen. No significant focal periaortic stranding or hemorrhage. Contrast bolus is administered via the right upper extremity cephalic vein. Irregularity of the subclavian vein as it passes through the thoracic inlet on the right. Extensive reflux of contrast throughout the right upper extremity possibly related to some degree of outlet obstruction at this level. Non  opacification of a right axillary venous stent as well as a left subclavian stent through which traverses a dual lumen left subclavian approach dialysis catheter terminating at the right atrium. Mediastinum/Nodes: Edematous changes of the mediastinum, similar to increased from prior. Scattered enlarged mediastinal and hilar lymph nodes are again seen, overall burden quite similar to the comparison study with larger nodes including a 15 mm AP window node on the left (4/24) and a 15 mm precarinal lymph node (4/29). A 14 mm right hilar node is present (4/36) as well. Some prominent though nonenlarged axillary nodes are seen bilaterally. No acute abnormality of the trachea. Some mild nonspecific esophageal thickening is noted. Thyroid gland is unremarkable. Lungs/Pleura: Significant redistribution of the pulmonary vascularity with diffuse interlobular septal thickening and fissural thickening with bilateral pleural effusions, increasing from the comparison exam in now tracking within the fissures. Some adjacent areas of likely passive atelectasis are seen. No pneumothorax. Upper Abdomen: Edematous changes in the upper abdomen. No other acute or significant upper abdominal findings. Vascular calcifications in the abdomen as well. Musculoskeletal: Extensive severe body wall edema. Reflux of contrast into the right upper quadrant as above. Progressive sclerotic and destructive features along the anterior T7-T8 disc space with some adjacent paravertebral soft tissue thickening highly concerning for developing osteomyelitis with paravertebral abscess/phlegmon. Chronic fusion across the T11-12 vertebral bodies. Background of more diffuse mild degenerative changes. No other acute or worrisome osseous features. No suspicious lytic or blastic lesions seen elsewhere. Review of the MIP images confirms the above findings. IMPRESSION: 1. No evidence of central pulmonary artery filling defects to suggest acute pulmonary embolism. 2.  Progressive sclerotic and destructive features along the anterior T7-T8 disc space with some adjacent paravertebral soft tissue thickening highly concerning for developing osteomyelitis with paravertebral abscess/phlegmon. Recommend further evaluation with MRI. 3. Extensive reflux of contrast throughout the right upper extremity possibly related to some degree of outlet obstruction or chronic occlusions of the stented right axillary and left subclavian veins. Left subclavian dialysis catheter tip terminates at the right atrium. 4. Cardiomegaly with four-chamber enlargement. Significant redistribution of the pulmonary vascularity with diffuse interlobular septal thickening and fissural thickening with bilateral pleural effusions, increasing from the comparison exam in now tracking within the fissures. Findings are favored to represent CHF with pulmonary edema. 5. Enlarged central pulmonary arteries may reflect some chronic pulmonary artery hypertension given similar to prior. 6. Scattered enlarged mediastinal and hilar lymph nodes, overall burden quite similar to the comparison study. May reflect a reactive etiology. 7. Aortic Atherosclerosis (ICD10-I70.0). Electronically Signed: By: Lovena Le M.D. On: 08/10/2020 22:23   MR THORACIC SPINE WO CONTRAST  Result Date: 08/11/2020 CLINICAL DATA:  Initial evaluation for possible osteomyelitis. EXAM: MRI THORACIC SPINE WITHOUT CONTRAST TECHNIQUE: Multiplanar, multisequence MR imaging of the thoracic spine was performed. No intravenous contrast was administered. COMPARISON:  Prior CT from 10/11/2019 FINDINGS: Alignment: Physiologic  with preservation of the normal thoracic kyphosis. No listhesis. Vertebrae: Vertebral body height maintained without acute or chronic fracture. Chronic endplate Schmorl's node deformity noted at the superior endplate of L2. T11 and T12 vertebral bodies are partially ankylosed anteriorly. Intervertebral disc space narrowing with increased  fluid signal intensity seen within the T7-8 interspace anteriorly. Mild associated endplate irregularity within the adjacent T7 and T8 endplates. Abnormal edema within the T7 and T8 vertebral bodies. Possible minimal edema within the adjacent paraspinous soft tissues. While these findings are somewhat nonspecific, appearance is most concerning for possible osteomyelitis discitis. No discrete paraspinous collection. No epidural abscess or other collection identified. No other evidence for acute infection within the thoracic spine. Cord:  Normal signal and morphology.  No epidural collections. Paraspinal and other soft tissues: Possible minimal paraspinous edema adjacent to the T7-8 interspace. Paraspinous soft tissues demonstrate no other acute finding. Irregular bilateral pleural effusions noted, better evaluated on prior CT. Disc levels: Normal expected multilevel disc desiccation seen throughout the thoracic spine for age. Chronic endplate Schmorl's node deformity without edema at the superior endplate of L2. No significant disc bulge or focal disc herniation. No significant spinal stenosis. Foramina remain patent. IMPRESSION: 1. Findings concerning for possible osteomyelitis discitis at the T7-8 interspace. Correlation with symptomatology and laboratory values recommended. No epidural abscess or other collection identified. 2. Irregular bilateral pleural effusions, better evaluated on prior CT. Electronically Signed   By: Jeannine Boga M.D.   On: 08/11/2020 03:33   DG Chest Portable 1 View  Result Date: 08/10/2020 CLINICAL DATA:  Chest pain shortness of breath, history of asthma. EXAM: PORTABLE CHEST 1 VIEW COMPARISON:  CT the abdomen and pelvis from September of 2021 and CT of the chest also from September of 2021 FINDINGS: Signs of LEFT axillary and subclavian venous stenting similar to the prior exam. Stenting also in the RIGHT mid arm. Interval removal of RIGHT-sided central venous access device  and placement of a LEFT-sided dialysis catheter, tip in the mid to low RIGHT atrium relative to the longest lumen. Trachea midline, air column normal. Cardiomediastinal contours with enlargement. Hilar structures with engorgement of central pulmonary vasculature with increased interstitial markings bilaterally. Blunting of LEFT costodiaphragmatic sulcus in patchy basilar airspace opacities. On limited assessment no acute skeletal process. IMPRESSION: 1. Findings consistent with volume overload/CHF. Difficult to exclude the possibility of concomitant infection particularly in the LEFT lung base. 2. New LEFT-sided dialysis catheter, tip in the mid to low RIGHT atrium. Electronically Signed   By: Zetta Bills M.D.   On: 08/10/2020 10:17     Scheduled Meds: . amLODipine  10 mg Oral Daily  . ascorbic acid  500 mg Oral Q breakfast  . aspirin EC  81 mg Oral Daily  . aspirin EC  81 mg Oral Daily  . atorvastatin  20 mg Oral QPM  . carvedilol  6.25 mg Oral BID WC  . Chlorhexidine Gluconate Cloth  6 each Topical Q0600  . cholecalciferol  1,000 Units Oral Daily  . cholestyramine light  4 g Oral BID  . epoetin (EPOGEN/PROCRIT) injection  4,000 Units Intravenous Q M,W,F-HD  . feeding supplement  237 mL Oral Q24H  . ferrous sulfate  325 mg Oral Q breakfast  . furosemide  80 mg Oral Daily  . gabapentin  100 mg Oral QPM  . heparin  5,000 Units Subcutaneous Q8H  . hydrALAZINE  50 mg Oral Q8H  . hydrOXYzine  25 mg Oral Daily  . lipase/protease/amylase  36,000 Units  Oral TID AC  . multivitamin  1 tablet Oral Daily  . omega-3 acid ethyl esters  1 capsule Oral BID  . pantoprazole  40 mg Oral Daily  . saccharomyces boulardii  250 mg Oral Daily  . sevelamer carbonate  800 mg Oral TID WC & HS  . sodium chloride flush  3 mL Intravenous Q12H  . cyanocobalamin  1,000 mcg Oral Daily   Continuous Infusions: . sodium chloride    . dextrose 20 mL/hr at 08/11/20 0611     LOS: 0 days    Time spent:  [place], MD [facility] To contact the attending provider between 7A-7P or the covering provider during after hours 7P-7A, please log into the web site [email or web address] and access using universal [place] password for that web site. If you do not have the password, please call the hospital operator.  08/11/2020, 7:55 AM

## 2020-08-11 NOTE — Progress Notes (Signed)
[name]  MRN: [id number]  DOB/AGE: 61-Oct-1962 61 y.o.  Primary Care Physician:[facility] [place] date: 08/10/2020  Chief Complaint:  Chief Complaint  Patient presents with  . Shortness of Breath  . Chest Pain    S-Pt presented on  08/10/2020 with  Chief Complaint  Patient presents with  . Shortness of Breath  . Chest Pain  . Patient was visibly upset in today's visit.  Patient was crying during my visit today as she was upset with the number of diagnosis.  Patient added the detail everything is going wrong.  Patient then informed me how she recently has been informed that she has congestive heart failure, how she was recently informed that there is some infection in her spine.  Patient went on to describe how she has dealt with her health issues from past many many years.  Patient counted how many times she has had the access placed and how this access on the left side is hard to manage as she is left-handed.  I empathized with the patient  Medications   . amLODipine  10 mg Oral Daily  . ascorbic acid  500 mg Oral Q breakfast  . aspirin EC  81 mg Oral Daily  . atorvastatin  20 mg Oral QPM  . carvedilol  6.25 mg Oral BID WC  . Chlorhexidine Gluconate Cloth  6 each Topical Q0600  . cholecalciferol  1,000 Units Oral Daily  . cholestyramine light  4 g Oral BID  . epoetin (EPOGEN/PROCRIT) injection  4,000 Units Intravenous Q M,W,F-HD  . feeding supplement  237 mL Oral Q24H  . ferrous sulfate  325 mg Oral Q breakfast  . furosemide  80 mg Oral Daily  . gabapentin  100 mg Oral QPM  . heparin  5,000 Units Subcutaneous Q8H  . hydrALAZINE  50 mg Oral Q8H  . hydrOXYzine  25 mg Oral Daily  . lipase/protease/amylase  36,000 Units Oral TID AC  . multivitamin  1 tablet Oral Daily  . omega-3 acid ethyl esters  1 capsule Oral BID  . pantoprazole  40 mg Oral Daily  . saccharomyces boulardii  250 mg Oral Daily  . sevelamer carbonate  800 mg Oral TID WC & HS  .  sodium chloride flush  3 mL Intravenous Q12H  . cyanocobalamin  1,000 mcg Oral Daily         JHE:RDEYC from the symptoms mentioned above,there are no other symptoms referable to all systems reviewed.  Physical Exam: Vital signs in last 24 hours: Temp:  [98.6 F (37 C)-98.9 F (37.2 C)] 98.6 F (37 C) (11/20 1128) Pulse Rate:  [62-71] 62 (11/20 1128) Resp:  [12-21] 16 (11/20 1128) BP: (136-172)/(55-85) 137/62 (11/20 1128) SpO2:  [90 %-100 %] 100 % (11/20 1128) Weight:  [77.1 kg-80.3 kg] 77.1 kg (11/20 0534) Weight change:  Last BM Date: 08/09/20  Intake/Output from previous day: 11/19 0701 - 11/20 0700 In: 326.5 [P.O.:320; I.V.:6.5] Out: 1101 [Stool:1] No intake/output data recorded.   Physical Exam: General- pt is awake,alert, oriented to time place and person  Resp- No acute REsp distress, CTA B/L NO Rhonchi  CVS- S1S2 regular in rate and rhythm  GIT- BS+, soft, Non tender , Non distended  EXT-1+ LE Edema, no Cyanosis  Access- Left  tunneled cath   Lab Results:  CBC  Recent Labs    08/10/20 1120 08/11/20 0110  WBC 4.3 3.5*  HGB 7.4* 7.1*  HCT 22.5* 22.1*  PLT 146*  107*    BMET  Recent Labs    08/10/20 1120 08/11/20 0110  NA 139 137  K 2.9* 3.3*  CL 99 99  CO2 28 29  GLUCOSE 46* 64*  BUN 8 6*  CREATININE 3.74* 3.06*  CALCIUM 7.7* 7.5*      Most recent Creatinine trend  Lab Results  Component Value Date   CREATININE 3.06 (H) 08/11/2020   CREATININE 3.74 (H) 08/10/2020   CREATININE 4.82 (H) 06/25/2020      MICRO   Recent Results (from the past 240 hour(s))  Resp Panel by RT-PCR (Flu A&B, Covid) Nasopharyngeal Swab     Status: None   Collection Time: 08/10/20 11:20 AM   Specimen: Nasopharyngeal Swab; Nasopharyngeal(NP) swabs in vial transport medium  Result Value Ref Range Status   SARS Coronavirus 2 by RT PCR NEGATIVE NEGATIVE Final    Comment: (NOTE) SARS-CoV-2 target nucleic acids are NOT DETECTED.  The SARS-CoV-2 RNA  is generally detectable in upper respiratory specimens during the acute phase of infection. The lowest concentration of SARS-CoV-2 viral copies this assay can detect is 138 copies/mL. A negative result does not preclude SARS-Cov-2 infection and should not be used as the sole basis for treatment or other patient management decisions. A negative result may occur with  improper specimen collection/handling, submission of specimen other than nasopharyngeal swab, presence of viral mutation(s) within the areas targeted by this assay, and inadequate number of viral copies(<138 copies/mL). A negative result must be combined with clinical observations, patient history, and epidemiological information. The expected result is Negative.  Fact Sheet for Patients:  EntrepreneurPulse.com.au  Fact Sheet for Healthcare Providers:  IncredibleEmployment.be  This test is no t yet approved or cleared by the [place] FDA and  has been authorized for detection and/or diagnosis of SARS-CoV-2 by FDA under an Emergency Use Authorization (EUA). This EUA will remain  in effect (meaning this test can be used) for the duration of the COVID-19 declaration under Section 564(b)(1) of the Act, 21 U.S.C.section 360bbb-3(b)(1), unless the authorization is terminated  or revoked sooner.       Influenza A by PCR NEGATIVE NEGATIVE Final   Influenza B by PCR NEGATIVE NEGATIVE Final    Comment: (NOTE) The Xpert Xpress SARS-CoV-2/FLU/RSV plus assay is intended as an aid in the diagnosis of influenza from Nasopharyngeal swab specimens and should not be used as a sole basis for treatment. Nasal washings and aspirates are unacceptable for Xpert Xpress SARS-CoV-2/FLU/RSV testing.  Fact Sheet for Patients: EntrepreneurPulse.com.au  Fact Sheet for Healthcare Providers: IncredibleEmployment.be  This test is not yet approved or cleared by the  [place] FDA and has been authorized for detection and/or diagnosis of SARS-CoV-2 by FDA under an Emergency Use Authorization (EUA). This EUA will remain in effect (meaning this test can be used) for the duration of the COVID-19 declaration under Section 564(b)(1) of the Act, 21 U.S.C. section 360bbb-3(b)(1), unless the authorization is terminated or revoked.  Performed at [facility], [address] [place],  [address]   MRSA PCR Screening     Status: None   Collection Time: 08/10/20 10:12 PM   Specimen: Nasal Mucosa; Nasopharyngeal  Result Value Ref Range Status   MRSA by PCR NEGATIVE NEGATIVE Final    Comment:        The GeneXpert MRSA Assay (FDA approved for NASAL specimens only), is one component of a comprehensive MRSA colonization surveillance program. It is not intended to diagnose MRSA infection nor to guide  or monitor treatment for MRSA infections. Performed at [facility], [address] [place], [place] [address]   Culture, blood (Routine X 2) w Reflex to ID Panel     Status: None (Preliminary result)   Collection Time: 08/11/20 12:51 AM   Specimen: BLOOD  Result Value Ref Range Status   Specimen Description BLOOD BLOOD LEFT HAND  Final   Special Requests   Final    BOTTLES DRAWN AEROBIC ONLY Blood Culture results may not be optimal due to an inadequate volume of blood received in culture bottles   Culture   Final    NO GROWTH < 12 HOURS Performed at [facility], [address], [place], [place] [address]    Report Status PENDING  Incomplete  Culture, blood (Routine X 2) w Reflex to ID Panel     Status: None (Preliminary result)   Collection Time: 08/11/20  1:03 AM   Specimen: BLOOD  Result Value Ref Range Status   Specimen Description BLOOD BLOOD LEFT HAND  Final   Special Requests   Final    BOTTLES DRAWN AEROBIC AND ANAEROBIC Blood Culture results may not be optimal due to an inadequate volume of blood  received in culture bottles   Culture   Final    NO GROWTH < 12 HOURS Performed at [facility], [address], [place], [place] [address]    Report Status PENDING  Incomplete         Impression:   [name] is a 59 year old African-American female with past medical history significant of ESRD-HD (MWF),dCHF, anemia, CAD, renal cell cancer, pancreatitis, anemia, HTN, HLD, TIA, diet controled DM, COPD, asthma, depression, anxiety, who presented  with shortness of breath   1)Renal    End-stage renal disease Patient is on hemodialysis Patient is on patient is on Monday Wednesday Friday schedule. Patient was last dialyzed yesterday   2)HTN    Blood pressure is stable    3)Anemia of chronic disease  CBC Latest Ref Rng & Units 08/11/2020 08/10/2020 06/25/2020  WBC 4.0 - 10.5 K/uL 3.5(L) 4.3 5.1  Hemoglobin 12.0 - 15.0 g/dL 7.1(L) 7.4(L) 7.9(L)  Hematocrit 36 - 46 % 22.1(L) 22.5(L) 24.3(L)  Platelets 150 - 400 K/uL 107(L) 146(L) 218       HGb is not at goal (9--11)   4) Secondary hyperparathyroidism -CKD Mineral-Bone Disorder    Lab Results  Component Value Date   PTH 357 (H) 10/05/2018   CALCIUM 7.5 (L) 08/11/2020   CAION 1.09 (L) 04/13/2020   PHOS 2.4 (L) 08/11/2020    Secondary Hyperparathyroidism present Phosphorus is not at goal.   5) acute on chronic diastolic CHF Patient was dialyzed yesterday Patient is now better  6) Electrolytes   BMP Latest Ref Rng & Units 08/11/2020 08/10/2020 06/25/2020  Glucose 70 - 99 mg/dL 64(L) 46(L) 76  BUN 8 - 23 mg/dL 6(L) 8 14  Creatinine 0.44 - 1.00 mg/dL 3.06(H) 3.74(H) 4.82(H)  Sodium 135 - 145 mmol/L 137 139 132(L)  Potassium 3.5 - 5.1 mmol/L 3.3(L) 2.9(L) 3.9  Chloride 98 - 111 mmol/L 99 99 96(L)  CO2 22 - 32 mmol/L 29 28 24   Calcium 8.9 - 10.3 mg/dL 7.5(L) 7.7(L) 8.5(L)     Sodium Normonatremic   Potassium Hypokalemia We will follow    7)Acid base  Co2 at goal  8)  osteomyelitis? Patient MRI thoracic spine done on November 19 showed 1. Findings concerning for possible osteomyelitis discitis at the T7-8 interspace.  Correlation with symptomatology and laboratory values recommended. No epidural abscess or other collection identified.   Primary team is following  Plan:   No need for dialysis today     Cheray Pardi s Itzelle Gains 08/11/2020, 3:15 PM

## 2020-08-12 DIAGNOSIS — M4644 Discitis, unspecified, thoracic region: Secondary | ICD-10-CM | POA: Diagnosis not present

## 2020-08-12 DIAGNOSIS — D638 Anemia in other chronic diseases classified elsewhere: Secondary | ICD-10-CM

## 2020-08-12 DIAGNOSIS — E11649 Type 2 diabetes mellitus with hypoglycemia without coma: Secondary | ICD-10-CM

## 2020-08-12 DIAGNOSIS — J9601 Acute respiratory failure with hypoxia: Secondary | ICD-10-CM | POA: Diagnosis not present

## 2020-08-12 DIAGNOSIS — R001 Bradycardia, unspecified: Secondary | ICD-10-CM

## 2020-08-12 DIAGNOSIS — I5033 Acute on chronic diastolic (congestive) heart failure: Secondary | ICD-10-CM | POA: Diagnosis not present

## 2020-08-12 LAB — COMPREHENSIVE METABOLIC PANEL
ALT: 9 U/L (ref 0–44)
AST: 15 U/L (ref 15–41)
Albumin: 2.3 g/dL — ABNORMAL LOW (ref 3.5–5.0)
Alkaline Phosphatase: 70 U/L (ref 38–126)
Anion gap: 11 (ref 5–15)
BUN: 10 mg/dL (ref 8–23)
CO2: 29 mmol/L (ref 22–32)
Calcium: 8.2 mg/dL — ABNORMAL LOW (ref 8.9–10.3)
Chloride: 95 mmol/L — ABNORMAL LOW (ref 98–111)
Creatinine, Ser: 4.28 mg/dL — ABNORMAL HIGH (ref 0.44–1.00)
GFR, Estimated: 11 mL/min — ABNORMAL LOW (ref >60)
Glucose, Bld: 101 mg/dL — ABNORMAL HIGH (ref 70–99)
Potassium: 3.9 mmol/L (ref 3.5–5.1)
Sodium: 135 mmol/L (ref 135–145)
Total Bilirubin: 0.5 mg/dL (ref 0.3–1.2)
Total Protein: 6 g/dL — ABNORMAL LOW (ref 6.5–8.1)

## 2020-08-12 LAB — CBC WITH DIFFERENTIAL/PLATELET
Abs Immature Granulocytes: 0.01 10*3/uL (ref 0.00–0.07)
Basophils Absolute: 0 10*3/uL (ref 0.0–0.1)
Basophils Relative: 1 %
Eosinophils Absolute: 1.1 10*3/uL — ABNORMAL HIGH (ref 0.0–0.5)
Eosinophils Relative: 27 %
HCT: 22.1 % — ABNORMAL LOW (ref 36.0–46.0)
Hemoglobin: 6.9 g/dL — ABNORMAL LOW (ref 12.0–15.0)
Immature Granulocytes: 0 %
Lymphocytes Relative: 34 %
Lymphs Abs: 1.4 10*3/uL (ref 0.7–4.0)
MCH: 28.2 pg (ref 26.0–34.0)
MCHC: 31.2 g/dL (ref 30.0–36.0)
MCV: 90.2 fL (ref 80.0–100.0)
Monocytes Absolute: 0.4 10*3/uL (ref 0.1–1.0)
Monocytes Relative: 10 %
Neutro Abs: 1.1 10*3/uL — ABNORMAL LOW (ref 1.7–7.7)
Neutrophils Relative %: 28 %
Platelets: 115 10*3/uL — ABNORMAL LOW (ref 150–400)
RBC: 2.45 MIL/uL — ABNORMAL LOW (ref 3.87–5.11)
RDW: 19.2 % — ABNORMAL HIGH (ref 11.5–15.5)
Smear Review: NORMAL
WBC: 4 10*3/uL (ref 4.0–10.5)
nRBC: 0 % (ref 0.0–0.2)

## 2020-08-12 LAB — GLUCOSE, CAPILLARY
Glucose-Capillary: 102 mg/dL — ABNORMAL HIGH (ref 70–99)
Glucose-Capillary: 134 mg/dL — ABNORMAL HIGH (ref 70–99)
Glucose-Capillary: 110 mg/dL — ABNORMAL HIGH (ref 70–99)
Glucose-Capillary: 97 mg/dL (ref 70–99)
Glucose-Capillary: 108 mg/dL — ABNORMAL HIGH (ref 70–99)

## 2020-08-12 LAB — URIC ACID: Uric Acid, Serum: 2.7 mg/dL (ref 2.5–7.1)

## 2020-08-12 LAB — PREPARE RBC (CROSSMATCH)

## 2020-08-12 LAB — SEDIMENTATION RATE: Sed Rate: 59 mm/hr — ABNORMAL HIGH (ref 0–30)

## 2020-08-12 IMAGING — US IR FLUORO GUIDE CV LINE*R*
1 series · 1 of 1 positions shown · IV contrast (agent unspecified)
Comparison: none

CLINICAL DATA: Poor venous access. Request IV access for
contrast-enhanced imaging study today.

EXAM:
ULTRASOUND GUIDED VENIPUNCTURE BY SERG
CONTRAST:  None
COMPLICATIONS:
None immediate
TECHNIQUE: The right upper arm was prepped with chlorhexidine in a sterile
fashion, and a sterile drape was applied covering the operative
field. Local anesthesia was provided with 1% lidocaine.

[Series 1: ir fluoro/shunt/fist · 1 of 1 slices shown]
[im 1/1]
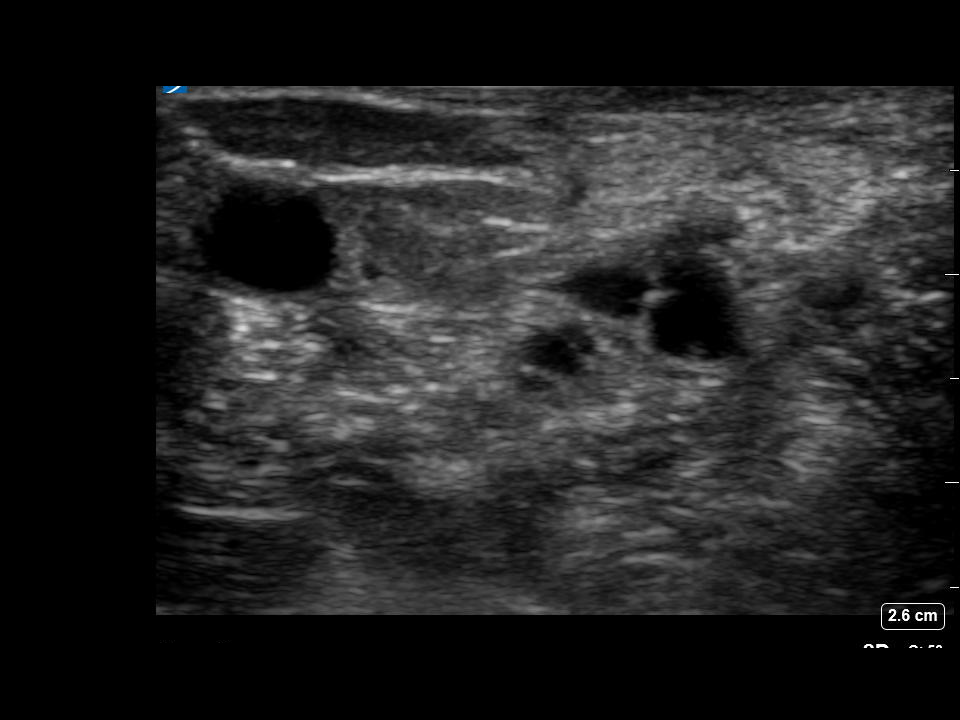

[1 of 1 positions shown; findings below may reference images not displayed]

Under direct ultrasound guidance, the right brachial vein was
accessed with a 5 French micropuncture kit after the overlying soft
tissues were anesthetized with 1% lidocaine. An ultrasound image was
saved for documentation purposes. The micropuncture sheath easily
aspirated and flushed and was secured in place. A dressing was
placed. The patient tolerated the procedure well without immediate
post procedural complication.
IMPRESSION: Successful ultrasound guided placement of a right brachial vein
approach micropuncture sheath for temporary venous access.

## 2020-08-12 IMAGING — CT CT ABD-PEL WO/W CM
4 of 16 series · 11 of 46 positions shown, 17 images · IV contrast (omnipaque)
Comparison: MRI 08/11/2019; CT scan of the abdomen and pelvis
12/07/2018

CLINICAL DATA: 58-year-old female with end-stage renal disease on
hemodialysis for the past 14 years and a reported history of right
renal mass.

EXAM:
CT ABDOMEN AND PELVIS WITHOUT AND WITH CONTRAST
TECHNIQUE: Multidetector CT imaging of the abdomen and pelvis was performed
following the standard protocol before and following the bolus
administration of intravenous contrast.
CONTRAST:  100mL OMNIPAQUE IOHEXOL 300 MG/ML  SOLN

[Series 4: coronal pre · coronal · non-contrast · 0.51mm/px · 1 of 102 slices shown, 2 images]
[im 51/102  soft-tissue]
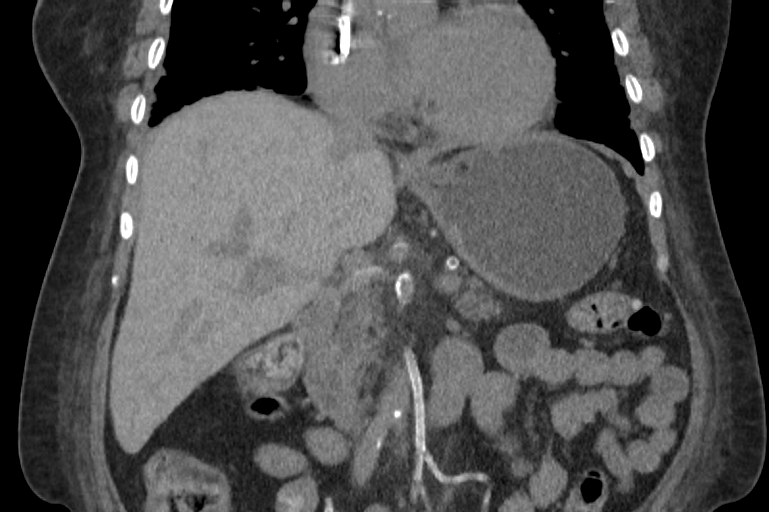
[im 51/102  bone]
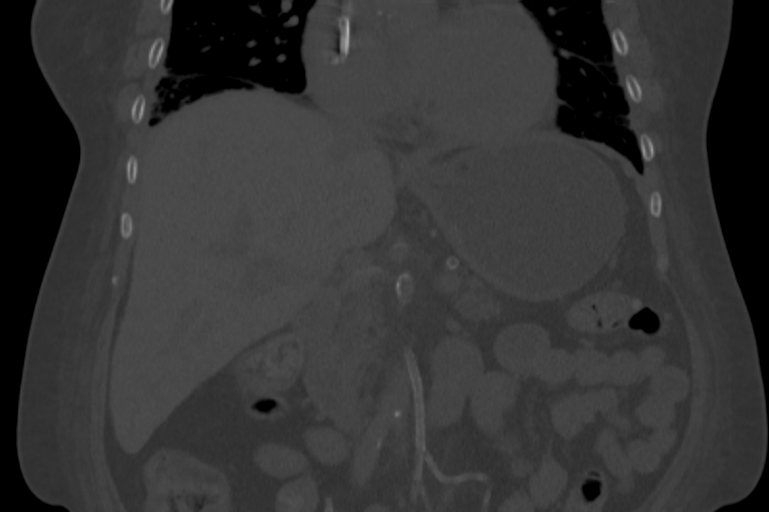

[Series 6: axial arterial · axial · arterial · 0.69mm/px · z∈[-706,-550]mm · 4 of 88 slices shown, 9 images]
[im 18/88  soft-tissue]
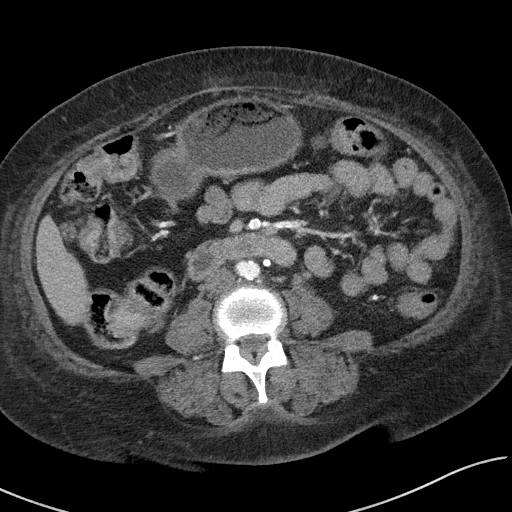
[im 18/88  lung]
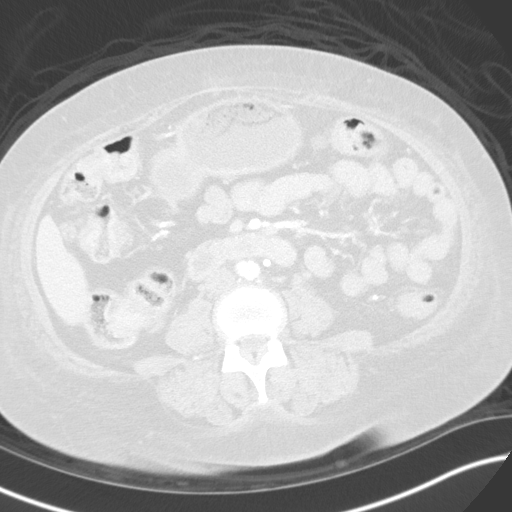
[im 18/88  bone]
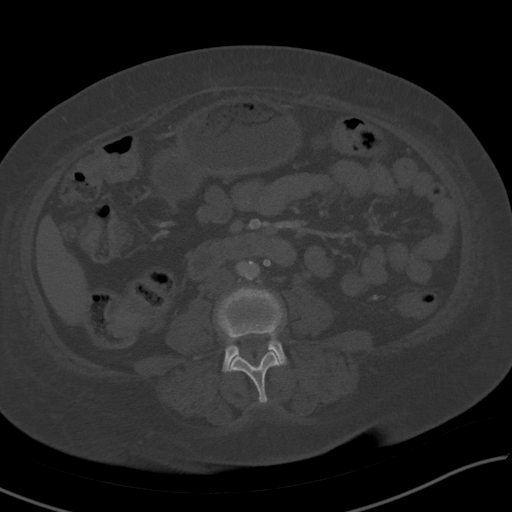
[im 35/88  soft-tissue]
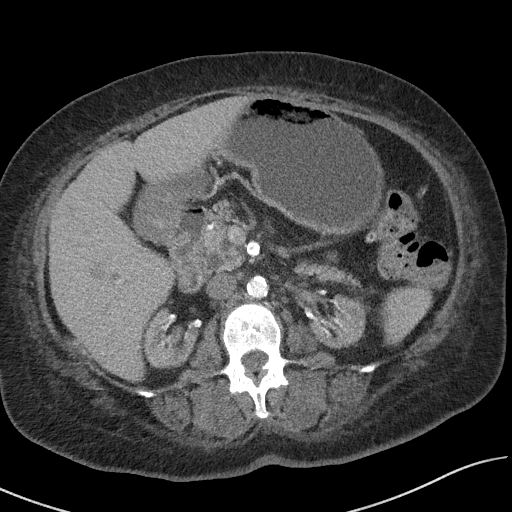
[im 35/88  lung]
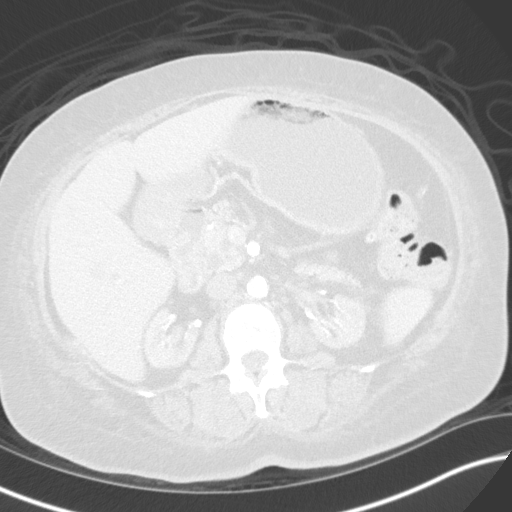
[im 53/88  soft-tissue]
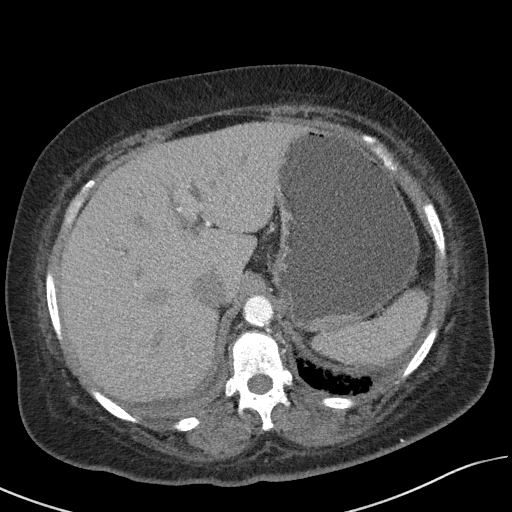
[im 53/88  lung]
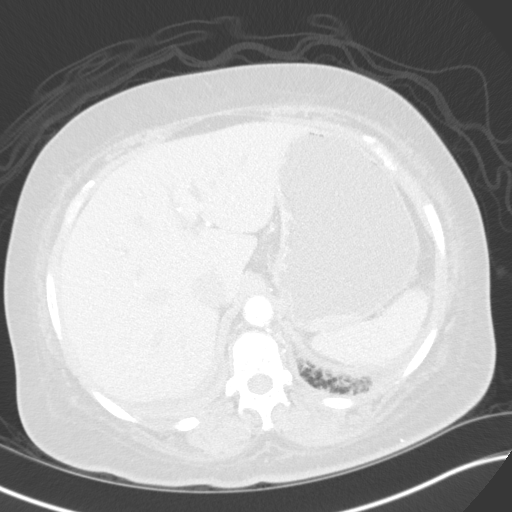
[im 70/88  soft-tissue]
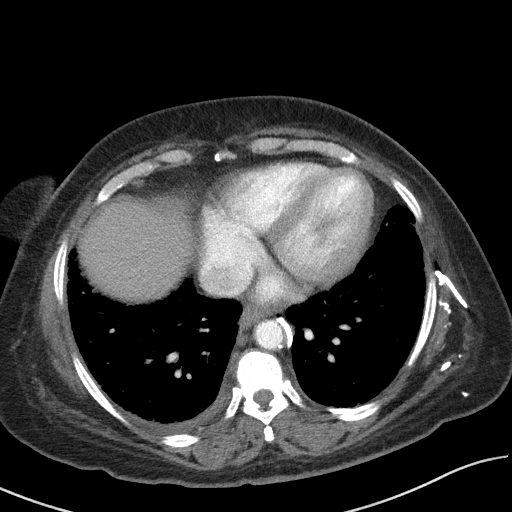
[im 70/88  lung]
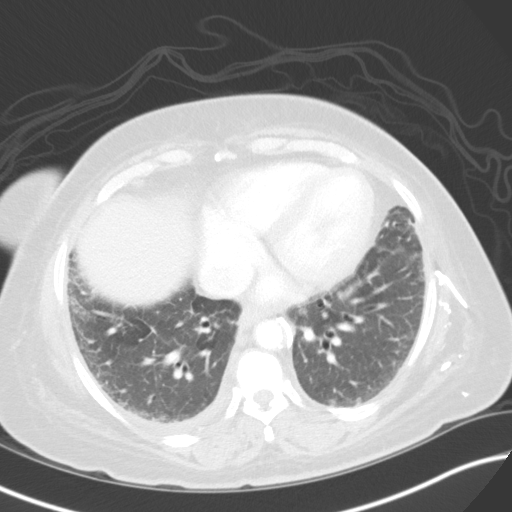

[Series 11: axial nephrographic · axial · 0.69mm/px · z∈[-706,-550]mm · 4 of 88 slices shown]
[im 18/88  soft-tissue]
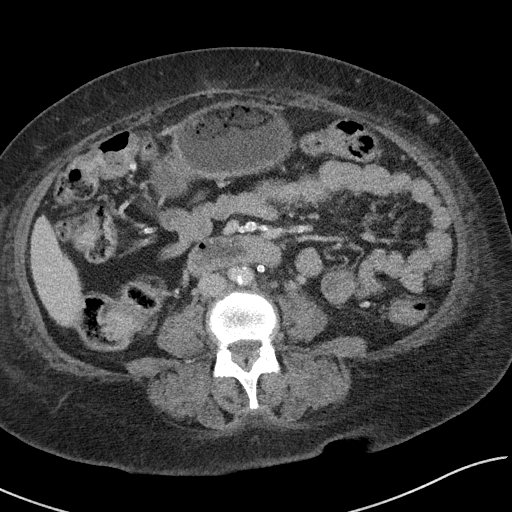
[im 35/88  soft-tissue]
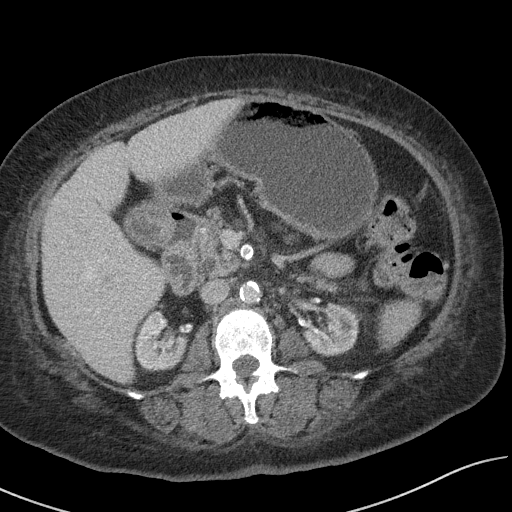
[im 53/88  soft-tissue]
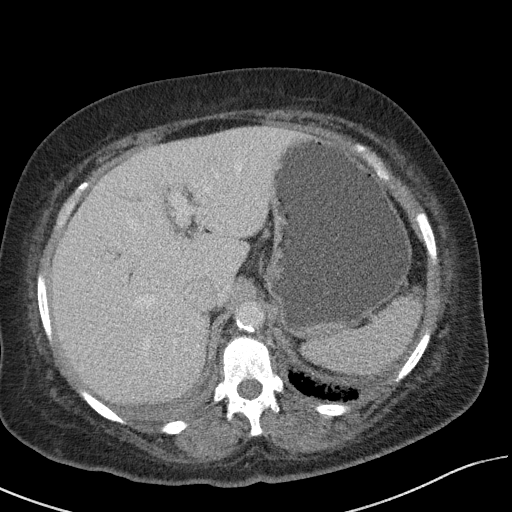
[im 70/88  soft-tissue]
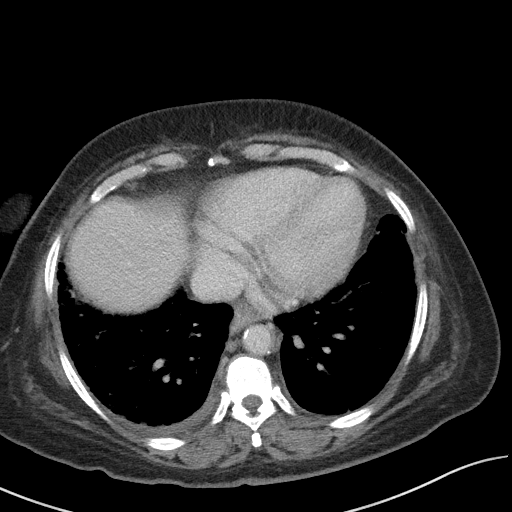

[Series 16: delay · axial · delayed · 0.69mm/px · z∈[-706,-656]mm · 2 of 88 slices shown]
[im 18/88  soft-tissue]
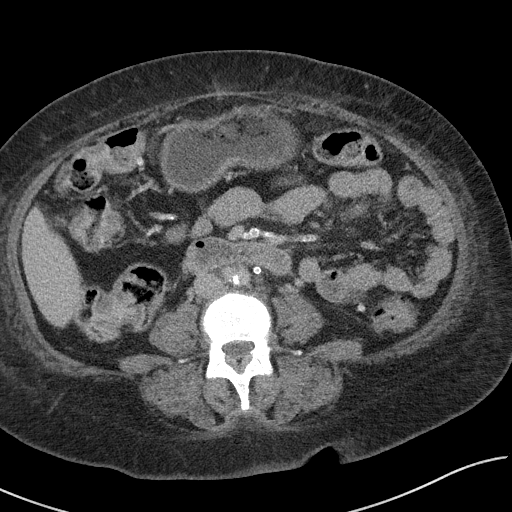
[im 35/88  soft-tissue]
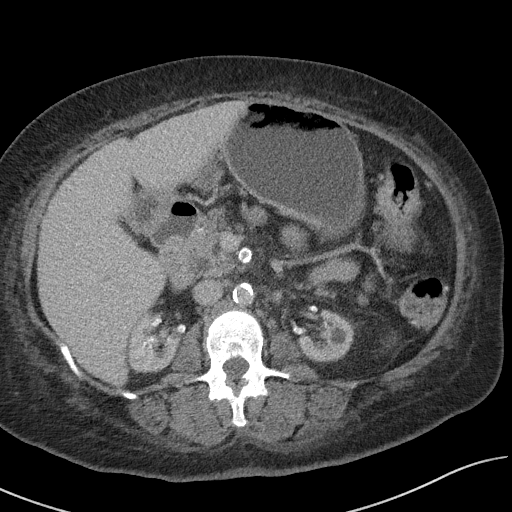

[11 of 46 positions shown; findings below may reference images not displayed]

FINDINGS: Lower chest: Stable 2 mm right lower lobe pulmonary nodule dating
back to at least Wednesday November, 2018 (image 4 series 3). Mild peripheral
subpleural reticulation and architectural distortion consistent with
pulmonary fibrosis. Trace right-sided pleural effusion. Mild
cardiomegaly. No pericardial effusion. There is a small hiatal
hernia.

Hepatobiliary: No focal liver abnormality is seen. Status post
cholecystectomy. No biliary dilatation.

Pancreas: Punctate calcifications in the uncinate process of the
pancreas likely reflects sequelae from prior pancreatitis. No mass
lesion or inflammatory changes.

Spleen: Normal in size without focal abnormality.

Adrenals/Urinary Tract: The adrenal glands are unremarkable. The
known right upper pole enhancing renal masses are significantly more
difficult to see on today's examination likely due to subtle
differences in contrast bolus timing. However, the lesion in the
posterior right upper pole remains unchanged in size at
approximately 1.2 x 1.0 cm. Similarly, the lesion in the medial
right upper pole slightly more inferior is also grossly unchanged at
1.2 x 1.1 cm. Additional small subcentimeter low-attenuation lesions
in both kidneys remain too small for accurate characterization but
statistically likely to represent benign cysts. Both kidneys are
atrophied. No hydronephrosis or nephrolithiasis.

Stomach/Bowel: Stomach is within normal limits. Appendix appears
normal. No evidence of bowel wall thickening, distention, or
inflammatory changes.

Vascular/Lymphatic: No suspicious lymphadenopathy. Extensive
atherosclerotic calcifications throughout the aorta and its branch
vessels. Both renal arteries appear to be patent without significant
stenosis. Slight beaded appearance of the splenic artery may be
secondary to atherosclerotic plaque along the tortuous vessel versus
fibromuscular dysplasia. This finding is of dubious clinical
significance.

Other: No abdominal wall hernia or abnormality. No abdominopelvic
ascites.

Musculoskeletal: No acute fracture or aggressive appearing lytic or
blastic osseous lesion.
IMPRESSION: 1. No significant interval change in the size or morphology of the 2
small enhancing lesions in the upper pole of the right kidney
compared to prior CT imaging from Wednesday November, 2018. Of note, the
lesions are more difficult to visualize on the present study likely
due to differences in contrast bolus timing.
2.  Aortic Atherosclerosis ([id number]-[id number]).
3. Additional ancillary findings as above.

## 2020-08-12 MED ORDER — ACETAMINOPHEN 325 MG PO TABS
650.0000 mg | ORAL_TABLET | Freq: Once | ORAL | Status: DC
  Filled 2020-08-12: qty 2

## 2020-08-12 MED ORDER — FUROSEMIDE 10 MG/ML IJ SOLN
100.0000 mg | Freq: Once | INTRAVENOUS | Status: DC
  Filled 2020-08-12: qty 10

## 2020-08-12 MED ORDER — SODIUM CHLORIDE 0.9% IV SOLUTION: Freq: Once | INTRAVENOUS | Status: DC

## 2020-08-12 MED ORDER — SEVELAMER CARBONATE 800 MG PO TABS
800.0000 mg | ORAL_TABLET | Freq: Two times a day (BID) | ORAL | Status: DC
  Administered 2020-08-12 – 2020-08-14 (×4): 800 mg via ORAL
  Filled 2020-08-12 (×4): qty 1

## 2020-08-12 MED ORDER — EPOETIN ALFA 10000 UNIT/ML IJ SOLN
8000.0000 [IU] | INTRAMUSCULAR | Status: DC
  Administered 2020-08-15 – 2020-08-17 (×2): 8000 [IU] via INTRAVENOUS

## 2020-08-12 NOTE — Plan of Care (Signed)

## 2020-08-12 NOTE — Progress Notes (Signed)
[name]  MRN: [id number]  DOB/AGE: May 24, 1961 59 y.o.  Primary Care Physician:[facility] date: 08/10/2020  Chief Complaint:  Chief Complaint  Patient presents with  . Shortness of Breath  . Chest Pain    S-Pt presented on  08/10/2020 with  Chief Complaint  Patient presents with  . Shortness of Breath  . Chest Pain  .  Patient is lying comfortably in the bed.  Patient is in cheerful mood, much better than yesterday   Medications   . amLODipine  10 mg Oral Daily  . ascorbic acid  500 mg Oral Q breakfast  . aspirin EC  81 mg Oral Daily  . atorvastatin  20 mg Oral QPM  . carvedilol  3.125 mg Oral BID WC  . Chlorhexidine Gluconate Cloth  6 each Topical Q0600  . cholecalciferol  1,000 Units Oral Daily  . cholestyramine light  4 g Oral BID  . [START ON 08/13/2020] epoetin (EPOGEN/PROCRIT) injection  8,000 Units Intravenous Q M,W,F-HD  . feeding supplement  237 mL Oral Q24H  . ferrous sulfate  325 mg Oral Q breakfast  . furosemide  80 mg Oral Daily  . gabapentin  100 mg Oral QPM  . heparin  5,000 Units Subcutaneous Q8H  . hydrALAZINE  50 mg Oral Q8H  . hydrOXYzine  25 mg Oral Daily  . lipase/protease/amylase  36,000 Units Oral TID AC  . multivitamin  1 tablet Oral Daily  . omega-3 acid ethyl esters  1 capsule Oral BID  . pantoprazole  40 mg Oral Daily  . saccharomyces boulardii  250 mg Oral Daily  . sevelamer carbonate  800 mg Oral TID WC & HS  . sodium chloride flush  3 mL Intravenous Q12H  . cyanocobalamin  1,000 mcg Oral Daily         QMV:HQION from the symptoms mentioned above,there are no other symptoms referable to all systems reviewed.  Physical Exam: Vital signs in last 24 hours: Temp:  [97.8 F (36.6 C)-98.6 F (37 C)] 98 F (36.7 C) (11/21 0858) Pulse Rate:  [56-62] 58 (11/21 0858) Resp:  [16-18] 18 (11/21 0858) BP: (114-137)/(43-62) 124/48 (11/21 0858) SpO2:  [98 %-100 %] 100 % (11/21 0858) Weight:  [79.7  kg] 79.7 kg (11/21 0428) Weight change: -0.558 kg Last BM Date: 08/09/20  Intake/Output from previous day: 11/20 0701 - 11/21 0700 In: 1082.1 [P.O.:720; I.V.:362.1] Out: 0  No intake/output data recorded.   Physical Exam:  General- pt is awake,alert, oriented to time place and person  Resp- No acute REsp distress, CTA B/L NO Rhonchi  CVS- S1S2 regular in rate and rhythm  GIT- BS+, soft, Non tender , Non distended  EXT-1+ LE Edema, no Cyanosis  Access- Left  tunneled cath   Lab Results:  CBC  Recent Labs    08/11/20 0110 08/12/20 0425  WBC 3.5* 4.0  HGB 7.1* 6.9*  HCT 22.1* 22.1*  PLT 107* 115*    BMET  Recent Labs    08/11/20 0110 08/12/20 0425  NA 137 135  K 3.3* 3.9  CL 99 95*  CO2 29 29  GLUCOSE 64* 101*  BUN 6* 10  CREATININE 3.06* 4.28*  CALCIUM 7.5* 8.2*      Most recent Creatinine trend  Lab Results  Component Value Date   CREATININE 4.28 (H) 08/12/2020   CREATININE 3.06 (H) 08/11/2020   CREATININE 3.74 (H) 08/10/2020      MICRO   Recent Results (from the  past 240 hour(s))  Resp Panel by RT-PCR (Flu A&B, Covid) Nasopharyngeal Swab     Status: None   Collection Time: 08/10/20 11:20 AM   Specimen: Nasopharyngeal Swab; Nasopharyngeal(NP) swabs in vial transport medium  Result Value Ref Range Status   SARS Coronavirus 2 by RT PCR NEGATIVE NEGATIVE Final    Comment: (NOTE) SARS-CoV-2 target nucleic acids are NOT DETECTED.  The SARS-CoV-2 RNA is generally detectable in upper respiratory specimens during the acute phase of infection. The lowest concentration of SARS-CoV-2 viral copies this assay can detect is 138 copies/mL. A negative result does not preclude SARS-Cov-2 infection and should not be used as the sole basis for treatment or other patient management decisions. A negative result may occur with  improper specimen collection/handling, submission of specimen other than nasopharyngeal swab, presence of viral mutation(s)  within the areas targeted by this assay, and inadequate number of viral copies(<138 copies/mL). A negative result must be combined with clinical observations, patient history, and epidemiological information. The expected result is Negative.  Fact Sheet for Patients:  EntrepreneurPulse.com.au  Fact Sheet for Healthcare Providers:  IncredibleEmployment.be  This test is no t yet approved or cleared by the [place] FDA and  has been authorized for detection and/or diagnosis of SARS-CoV-2 by FDA under an Emergency Use Authorization (EUA). This EUA will remain  in effect (meaning this test can be used) for the duration of the COVID-19 declaration under Section 564(b)(1) of the Act, 21 U.S.C.section 360bbb-3(b)(1), unless the authorization is terminated  or revoked sooner.       Influenza A by PCR NEGATIVE NEGATIVE Final   Influenza B by PCR NEGATIVE NEGATIVE Final    Comment: (NOTE) The Xpert Xpress SARS-CoV-2/FLU/RSV plus assay is intended as an aid in the diagnosis of influenza from Nasopharyngeal swab specimens and should not be used as a sole basis for treatment. Nasal washings and aspirates are unacceptable for Xpert Xpress SARS-CoV-2/FLU/RSV testing.  Fact Sheet for Patients: EntrepreneurPulse.com.au  Fact Sheet for Healthcare Providers: IncredibleEmployment.be  This test is not yet approved or cleared by the [place] FDA and has been authorized for detection and/or diagnosis of SARS-CoV-2 by FDA under an Emergency Use Authorization (EUA). This EUA will remain in effect (meaning this test can be used) for the duration of the COVID-19 declaration under Section 564(b)(1) of the Act, 21 U.S.C. section 360bbb-3(b)(1), unless the authorization is terminated or revoked.  Performed at [facility], [address] [place], [place] [address]   MRSA PCR Screening     Status: None    Collection Time: 08/10/20 10:12 PM   Specimen: Nasal Mucosa; Nasopharyngeal  Result Value Ref Range Status   MRSA by PCR NEGATIVE NEGATIVE Final    Comment:        The GeneXpert MRSA Assay (FDA approved for NASAL specimens only), is one component of a comprehensive MRSA colonization surveillance program. It is not intended to diagnose MRSA infection nor to guide or monitor treatment for MRSA infections. Performed at [facility], [address] [place], [place] [address]   Culture, blood (Routine X 2) w Reflex to ID Panel     Status: None (Preliminary result)   Collection Time: 08/11/20 12:51 AM   Specimen: BLOOD  Result Value Ref Range Status   Specimen Description BLOOD BLOOD LEFT HAND  Final   Special Requests   Final    BOTTLES DRAWN AEROBIC ONLY Blood Culture results may not be optimal due to an inadequate volume of blood  received in culture bottles   Culture   Final    NO GROWTH 1 DAY Performed at [facility], [address] [place], [place] [address]    Report Status PENDING  Incomplete  Culture, blood (Routine X 2) w Reflex to ID Panel     Status: None (Preliminary result)   Collection Time: 08/11/20  1:03 AM   Specimen: BLOOD  Result Value Ref Range Status   Specimen Description BLOOD BLOOD LEFT HAND  Final   Special Requests   Final    BOTTLES DRAWN AEROBIC AND ANAEROBIC Blood Culture results may not be optimal due to an inadequate volume of blood received in culture bottles   Culture   Final    NO GROWTH 1 DAY Performed at [facility], [address], [place], [place] [address]    Report Status PENDING  Incomplete         Impression:   [name] is a 59 year old African-American female with past medical history significant of ESRD-HD (MWF),dCHF, anemia, CAD, renal cell cancer, pancreatitis, anemia, HTN, HLD, TIA, diet controled DM, COPD, asthma, depression, anxiety, who presented  with shortness of breath   1)Renal     End-stage renal disease Patient is on hemodialysis Patient is on patient is on Monday Wednesday Friday schedule. Patient was last dialyzed on Friday No need for renal placement therapy today  2)HTN    Blood pressure is stable    3)Anemia of chronic disease  CBC Latest Ref Rng & Units 08/12/2020 08/11/2020 08/10/2020  WBC 4.0 - 10.5 K/uL 4.0 3.5(L) 4.3  Hemoglobin 12.0 - 15.0 g/dL 6.9(L) 7.1(L) 7.4(L)  Hematocrit 36 - 46 % 22.1(L) 22.1(L) 22.5(L)  Platelets 150 - 400 K/uL 115(L) 107(L) 146(L)       HGb is not at goal (9--11) Will increase epo Pt may need PRBC   4) Secondary hyperparathyroidism -CKD Mineral-Bone Disorder    Lab Results  Component Value Date   PTH 357 (H) 10/05/2018   CALCIUM 8.2 (L) 08/12/2020   CAION 1.09 (L) 04/13/2020   PHOS 2.4 (L) 08/11/2020    Secondary Hyperparathyroidism present Phosphorus is not at goal. We will reduce the binders  5) acute on chronic diastolic CHF Patient was dialyzed yesterday Patient is now better  6) Electrolytes   BMP Latest Ref Rng & Units 08/12/2020 08/11/2020 08/10/2020  Glucose 70 - 99 mg/dL 101(H) 64(L) 46(L)  BUN 8 - 23 mg/dL 10 6(L) 8  Creatinine 0.44 - 1.00 mg/dL 4.28(H) 3.06(H) 3.74(H)  Sodium 135 - 145 mmol/L 135 137 139  Potassium 3.5 - 5.1 mmol/L 3.9 3.3(L) 2.9(L)  Chloride 98 - 111 mmol/L 95(L) 99 99  CO2 22 - 32 mmol/L 29 29 28   Calcium 8.9 - 10.3 mg/dL 8.2(L) 7.5(L) 7.7(L)     Sodium Normonatremic   Potassium Hypokalemia We will follow    7)Acid base  Co2 at goal  8) possible osteomyelitis Patient MRI thoracic spine done on November 19 showed 1. Findings concerning for possible osteomyelitis discitis at the T7-8 interspace. Correlation with symptomatology and laboratory values recommended. No epidural abscess or other collection identified.   Primary team is following  Plan:   No need for dialysis today Patient phosphorus is low, will reduce binders from 3 times  daily to twice daily We will dialyze in the morning    Bethenny Losee s [facility] 08/12/2020, 9:09 AM

## 2020-08-12 NOTE — Progress Notes (Signed)
   08/11/20 0530  Clinical Encounter Type  Visited With Patient  Visit Type Initial;Spiritual support;Social support  Referral From Nurse  Consult/Referral To Chaplain  Ch visited with Pt per OR for Major life transition. Pt informed Ch of everything she had going on. Pt told Ch she is tired. Pt said she do not want to give up, but she is tired of being sick. Ch gave Pt some encouraging words. Ch prayed for Pt and will follow up later.

## 2020-08-12 NOTE — Progress Notes (Signed)
Patient ID: [name], female   DOB: 12-Jan-1961, 59 y.o.   MRN: [id number] [facility] PROGRESS NOTE  [name] HEN:[id number] DOB: 12-22-1960 DOA: 08/10/2020 PCP: [facility]  HPI/Subjective: Patient initially came in with shortness of breath.  Breathing is better.  Patient complaining of right finger stiffness and knee stiffness.  She stated she has had steroid injections in the past.  Objective: Vitals:   08/12/20 1140 08/12/20 1312  BP: (!) 150/71 (!) 151/75  Pulse: 60 61  Resp: 18   Temp: 97.9 F (36.6 C)   SpO2: 100%     Intake/Output Summary (Last 24 hours) at 08/12/2020 1513 Last data filed at 08/12/2020 0444 Gross per 24 hour  Intake 842.07 ml  Output 0 ml  Net 842.07 ml   Filed Weights   08/10/20 1853 08/11/20 0534 08/12/20 0428  Weight: 80.3 kg 77.1 kg 79.7 kg    ROS: Review of Systems  Respiratory: Negative for cough and shortness of breath.   Cardiovascular: Negative for chest pain.  Gastrointestinal: Positive for nausea. Negative for abdominal pain and vomiting.   Exam: Physical Exam HENT:     Head: Normocephalic.     Mouth/Throat:     Pharynx: No oropharyngeal exudate.  Eyes:     General: Lids are normal.     Conjunctiva/sclera: Conjunctivae normal.     Pupils: Pupils are equal, round, and reactive to light.  Cardiovascular:     Rate and Rhythm: Normal rate and regular rhythm.     Heart sounds: Normal heart sounds, S1 normal and S2 normal.  Pulmonary:     Breath sounds: No decreased breath sounds, wheezing, rhonchi or rales.  Abdominal:     Palpations: Abdomen is soft.     Tenderness: There is no abdominal tenderness.     Comments: Small superficial knot left lower abdomen  Musculoskeletal:     Right knee: No swelling.     Left knee: No swelling.  Skin:    General: Skin is warm.     Findings: No rash.  Neurological:     Mental Status: She is alert and oriented to person, place, and time.       Data  Reviewed: Basic Metabolic Panel: Recent Labs  Lab 08/10/20 1120 08/10/20 1330 08/11/20 0110 08/12/20 0425  NA 139  --  137 135  K 2.9*  --  3.3* 3.9  CL 99  --  99 95*  CO2 28  --  29 29  GLUCOSE 46*  --  64* 101*  BUN 8  --  6* 10  CREATININE 3.74*  --  3.06* 4.28*  CALCIUM 7.7*  --  7.5* 8.2*  MG  --  1.8 1.8  --   PHOS  --   --  2.4*  --    Liver Function Tests: Recent Labs  Lab 08/10/20 1120 08/12/20 0425  AST 17 15  ALT 10 9  ALKPHOS 68 70  BILITOT 0.5 0.5  PROT 6.1* 6.0*  ALBUMIN 2.4* 2.3*   CBC: Recent Labs  Lab 08/10/20 1120 08/11/20 0110 08/12/20 0425  WBC 4.3 3.5* 4.0  NEUTROABS  --   --  1.1*  HGB 7.4* 7.1* 6.9*  HCT 22.5* 22.1* 22.1*  MCV 87.9 89.1 90.2  PLT 146* 107* 115*  BNP (last 3 results) Recent Labs    05/22/20 0840  BNP 620.7*    CBG: Recent Labs  Lab 08/11/20 2040 08/11/20 2350 08/12/20 0430 08/12/20 0855 08/12/20 1142  GLUCAP 139* 120* 102* 134* 110*    Recent Results (from the past 240 hour(s))  Resp Panel by RT-PCR (Flu A&B, Covid) Nasopharyngeal Swab     Status: None   Collection Time: 08/10/20 11:20 AM   Specimen: Nasopharyngeal Swab; Nasopharyngeal(NP) swabs in vial transport medium  Result Value Ref Range Status   SARS Coronavirus 2 by RT PCR NEGATIVE NEGATIVE Final    Comment: (NOTE) SARS-CoV-2 target nucleic acids are NOT DETECTED.  The SARS-CoV-2 RNA is generally detectable in upper respiratory specimens during the acute phase of infection. The lowest concentration of SARS-CoV-2 viral copies this assay can detect is 138 copies/mL. A negative result does not preclude SARS-Cov-2 infection and should not be used as the sole basis for treatment or other patient management decisions. A negative result may occur with  improper specimen collection/handling, submission of specimen other than nasopharyngeal swab, presence of viral mutation(s) within the areas targeted by this assay, and inadequate number of  viral copies(<138 copies/mL). A negative result must be combined with clinical observations, patient history, and epidemiological information. The expected result is Negative.  Fact Sheet for Patients:  EntrepreneurPulse.com.au  Fact Sheet for Healthcare Providers:  IncredibleEmployment.be  This test is no t yet approved or cleared by the [place] FDA and  has been authorized for detection and/or diagnosis of SARS-CoV-2 by FDA under an Emergency Use Authorization (EUA). This EUA will remain  in effect (meaning this test can be used) for the duration of the COVID-19 declaration under Section 564(b)(1) of the Act, 21 U.S.C.section 360bbb-3(b)(1), unless the authorization is terminated  or revoked sooner.       Influenza A by PCR NEGATIVE NEGATIVE Final   Influenza B by PCR NEGATIVE NEGATIVE Final    Comment: (NOTE) The Xpert Xpress SARS-CoV-2/FLU/RSV plus assay is intended as an aid in the diagnosis of influenza from Nasopharyngeal swab specimens and should not be used as a sole basis for treatment. Nasal washings and aspirates are unacceptable for Xpert Xpress SARS-CoV-2/FLU/RSV testing.  Fact Sheet for Patients: EntrepreneurPulse.com.au  Fact Sheet for Healthcare Providers: IncredibleEmployment.be  This test is not yet approved or cleared by the [place] FDA and has been authorized for detection and/or diagnosis of SARS-CoV-2 by FDA under an Emergency Use Authorization (EUA). This EUA will remain in effect (meaning this test can be used) for the duration of the COVID-19 declaration under Section 564(b)(1) of the Act, 21 U.S.C. section 360bbb-3(b)(1), unless the authorization is terminated or revoked.  Performed at [facility], [address] [place], [place] [address]   MRSA PCR Screening     Status: None   Collection Time: 08/10/20 10:12 PM   Specimen: Nasal Mucosa;  Nasopharyngeal  Result Value Ref Range Status   MRSA by PCR NEGATIVE NEGATIVE Final    Comment:        The GeneXpert MRSA Assay (FDA approved for NASAL specimens only), is one component of a comprehensive MRSA colonization surveillance program. It is not intended to diagnose MRSA infection nor to guide or monitor treatment for MRSA infections. Performed at [facility], [address] [place], [place] [address]   Culture, blood (Routine X 2) w Reflex to ID Panel     Status: None (Preliminary result)   Collection Time: 08/11/20 12:51 AM   Specimen: BLOOD  Result Value Ref Range Status   Specimen Description BLOOD BLOOD LEFT HAND  Final   Special Requests   Final    BOTTLES DRAWN AEROBIC ONLY Blood  Culture results may not be optimal due to an inadequate volume of blood received in culture bottles   Culture   Final    NO GROWTH 1 DAY Performed at [facility], [address] [place], [place] [address]    Report Status PENDING  Incomplete  Culture, blood (Routine X 2) w Reflex to ID Panel     Status: None (Preliminary result)   Collection Time: 08/11/20  1:03 AM   Specimen: BLOOD  Result Value Ref Range Status   Specimen Description BLOOD BLOOD LEFT HAND  Final   Special Requests   Final    BOTTLES DRAWN AEROBIC AND ANAEROBIC Blood Culture results may not be optimal due to an inadequate volume of blood received in culture bottles   Culture   Final    NO GROWTH 1 DAY Performed at [facility], [address], [place], [place] [address]    Report Status PENDING  Incomplete     Studies: CT ANGIO CHEST PE W OR WO CONTRAST  Addendum Date: 08/10/2020   ADDENDUM REPORT: 08/10/2020 22:32 ADDENDUM: Critical Value/emergent results were called by telephone at the time of addendum submission on 08/10/2020 at 10:32 pm to provider NP Randol Kern, who verbally acknowledged these results. Electronically Signed   By: Lovena Le M.D.   On: 08/10/2020 22:32    Result Date: 08/10/2020 CLINICAL DATA:  PE suspected, history of ESRD, CHF, CAD, and renal cell carcinoma EXAM: CT ANGIOGRAPHY CHEST WITH CONTRAST TECHNIQUE: Multidetector CT imaging of the chest was performed using the standard protocol during bolus administration of intravenous contrast. Multiplanar CT image reconstructions and MIPs were obtained to evaluate the vascular anatomy. CONTRAST:  26mL OMNIPAQUE IOHEXOL 350 MG/ML SOLN COMPARISON:  CT 06/11/2020 FINDINGS: Cardiovascular: Satisfactory opacification the pulmonary arteries to the segmental level. No central pulmonary artery filling defects are identified. Central pulmonary arteries are enlarged. May reflect some chronic pulmonary artery hypertension given similar to prior. Cardiomegaly with four-chamber enlargement. Small volume pericardial effusion and fluid in the pericardial recesses. Suboptimal opacification of the aorta for luminal assessment. Atherosclerotic plaque within the normal caliber aorta. No hyperdense mural thickening or plaque displacement. No clear acute luminal abnormality is seen. No significant focal periaortic stranding or hemorrhage. Contrast bolus is administered via the right upper extremity cephalic vein. Irregularity of the subclavian vein as it passes through the thoracic inlet on the right. Extensive reflux of contrast throughout the right upper extremity possibly related to some degree of outlet obstruction at this level. Non opacification of a right axillary venous stent as well as a left subclavian stent through which traverses a dual lumen left subclavian approach dialysis catheter terminating at the right atrium. Mediastinum/Nodes: Edematous changes of the mediastinum, similar to increased from prior. Scattered enlarged mediastinal and hilar lymph nodes are again seen, overall burden quite similar to the comparison study with larger nodes including a 15 mm AP window node on the left (4/24) and a 15 mm precarinal lymph  node (4/29). A 14 mm right hilar node is present (4/36) as well. Some prominent though nonenlarged axillary nodes are seen bilaterally. No acute abnormality of the trachea. Some mild nonspecific esophageal thickening is noted. Thyroid gland is unremarkable. Lungs/Pleura: Significant redistribution of the pulmonary vascularity with diffuse interlobular septal thickening and fissural thickening with bilateral pleural effusions, increasing from the comparison exam in now tracking within the fissures. Some adjacent areas of likely passive atelectasis are seen. No pneumothorax. Upper Abdomen: Edematous changes in the upper abdomen. No other  acute or significant upper abdominal findings. Vascular calcifications in the abdomen as well. Musculoskeletal: Extensive severe body wall edema. Reflux of contrast into the right upper quadrant as above. Progressive sclerotic and destructive features along the anterior T7-T8 disc space with some adjacent paravertebral soft tissue thickening highly concerning for developing osteomyelitis with paravertebral abscess/phlegmon. Chronic fusion across the T11-12 vertebral bodies. Background of more diffuse mild degenerative changes. No other acute or worrisome osseous features. No suspicious lytic or blastic lesions seen elsewhere. Review of the MIP images confirms the above findings. IMPRESSION: 1. No evidence of central pulmonary artery filling defects to suggest acute pulmonary embolism. 2. Progressive sclerotic and destructive features along the anterior T7-T8 disc space with some adjacent paravertebral soft tissue thickening highly concerning for developing osteomyelitis with paravertebral abscess/phlegmon. Recommend further evaluation with MRI. 3. Extensive reflux of contrast throughout the right upper extremity possibly related to some degree of outlet obstruction or chronic occlusions of the stented right axillary and left subclavian veins. Left subclavian dialysis catheter tip  terminates at the right atrium. 4. Cardiomegaly with four-chamber enlargement. Significant redistribution of the pulmonary vascularity with diffuse interlobular septal thickening and fissural thickening with bilateral pleural effusions, increasing from the comparison exam in now tracking within the fissures. Findings are favored to represent CHF with pulmonary edema. 5. Enlarged central pulmonary arteries may reflect some chronic pulmonary artery hypertension given similar to prior. 6. Scattered enlarged mediastinal and hilar lymph nodes, overall burden quite similar to the comparison study. May reflect a reactive etiology. 7. Aortic Atherosclerosis (ICD10-I70.0). Electronically Signed: By: Lovena Le M.D. On: 08/10/2020 22:23   MR THORACIC SPINE WO CONTRAST  Result Date: 08/11/2020 CLINICAL DATA:  Initial evaluation for possible osteomyelitis. EXAM: MRI THORACIC SPINE WITHOUT CONTRAST TECHNIQUE: Multiplanar, multisequence MR imaging of the thoracic spine was performed. No intravenous contrast was administered. COMPARISON:  Prior CT from 10/11/2019 FINDINGS: Alignment: Physiologic with preservation of the normal thoracic kyphosis. No listhesis. Vertebrae: Vertebral body height maintained without acute or chronic fracture. Chronic endplate Schmorl's node deformity noted at the superior endplate of L2. T11 and T12 vertebral bodies are partially ankylosed anteriorly. Intervertebral disc space narrowing with increased fluid signal intensity seen within the T7-8 interspace anteriorly. Mild associated endplate irregularity within the adjacent T7 and T8 endplates. Abnormal edema within the T7 and T8 vertebral bodies. Possible minimal edema within the adjacent paraspinous soft tissues. While these findings are somewhat nonspecific, appearance is most concerning for possible osteomyelitis discitis. No discrete paraspinous collection. No epidural abscess or other collection identified. No other evidence for acute  infection within the thoracic spine. Cord:  Normal signal and morphology.  No epidural collections. Paraspinal and other soft tissues: Possible minimal paraspinous edema adjacent to the T7-8 interspace. Paraspinous soft tissues demonstrate no other acute finding. Irregular bilateral pleural effusions noted, better evaluated on prior CT. Disc levels: Normal expected multilevel disc desiccation seen throughout the thoracic spine for age. Chronic endplate Schmorl's node deformity without edema at the superior endplate of L2. No significant disc bulge or focal disc herniation. No significant spinal stenosis. Foramina remain patent. IMPRESSION: 1. Findings concerning for possible osteomyelitis discitis at the T7-8 interspace. Correlation with symptomatology and laboratory values recommended. No epidural abscess or other collection identified. 2. Irregular bilateral pleural effusions, better evaluated on prior CT. Electronically Signed   By: Jeannine Boga M.D.   On: 08/11/2020 03:33   US Venous Img Upper Uni Right(DVT)  Result Date: 08/11/2020 CLINICAL DATA:  Dialysis patient, previous upper extremity access  sites, venous stenting EXAM: RIGHT UPPER EXTREMITY VENOUS DOPPLER ULTRASOUND TECHNIQUE: Gray-scale sonography with graded compression, as well as color Doppler and duplex ultrasound were performed to evaluate the upper extremity deep venous system from the level of the subclavian vein and including the jugular, axillary, basilic, radial, ulnar and upper cephalic vein. Spectral Doppler was utilized to evaluate flow at rest and with distal augmentation maneuvers. COMPARISON:  None. FINDINGS: Contralateral Subclavian Vein: Respiratory phasicity is normal and symmetric with the symptomatic side. No evidence of thrombus. Normal compressibility. Internal Jugular Vein: No evidence of thrombus. Normal compressibility, respiratory phasicity and response to augmentation. Subclavian Vein: No evidence of thrombus.  Normal compressibility, respiratory phasicity and response to augmentation. Axillary Vein: No evidence of thrombus. Normal compressibility, respiratory phasicity and response to augmentation. Cephalic Vein: Not visualized related to prior dialysis access. Suspect occluded. Basilic Vein: No evidence of thrombus. Normal compressibility, respiratory phasicity and response to augmentation. Brachial Veins: No evidence of thrombus. Normal compressibility, respiratory phasicity and response to augmentation. Radial Veins: No evidence of thrombus. Normal compressibility, respiratory phasicity and response to augmentation. Ulnar Veins: No evidence of thrombus. Normal compressibility, respiratory phasicity and response to augmentation. Other Findings: Old right upper extremity dialysis access is occluded. IMPRESSION: No significant acute right upper extremity occlusive DVT. Electronically Signed   By: Jerilynn Mages.  Shick M.D.   On: 08/11/2020 10:51    Scheduled Meds: . sodium chloride   Intravenous Once  . acetaminophen  650 mg Oral Once  . amLODipine  10 mg Oral Daily  . ascorbic acid  500 mg Oral Q breakfast  . aspirin EC  81 mg Oral Daily  . atorvastatin  20 mg Oral QPM  . carvedilol  3.125 mg Oral BID WC  . Chlorhexidine Gluconate Cloth  6 each Topical Q0600  . cholecalciferol  1,000 Units Oral Daily  . cholestyramine light  4 g Oral BID  . [START ON 08/13/2020] epoetin (EPOGEN/PROCRIT) injection  8,000 Units Intravenous Q M,W,F-HD  . feeding supplement  237 mL Oral Q24H  . ferrous sulfate  325 mg Oral Q breakfast  . furosemide  80 mg Oral Daily  . gabapentin  100 mg Oral QPM  . hydrALAZINE  50 mg Oral Q8H  . hydrOXYzine  25 mg Oral Daily  . lipase/protease/amylase  36,000 Units Oral TID AC  . multivitamin  1 tablet Oral Daily  . omega-3 acid ethyl esters  1 capsule Oral BID  . pantoprazole  40 mg Oral Daily  . saccharomyces boulardii  250 mg Oral Daily  . sevelamer carbonate  800 mg Oral BID WC  . sodium  chloride flush  3 mL Intravenous Q12H  . cyanocobalamin  1,000 mcg Oral Daily   Continuous Infusions: . sodium chloride    . dextrose 50 mL/hr at 08/11/20 1731  . furosemide      Assessment/Plan:  1. T7/T8 osteomyelitis versus discitis.  Case discussed with interventional radiology and they will attempt a aspiration and culture of this tomorrow.  Hold heparin subcu and aspirin. 2. Acute hypoxic respiratory failure with pulse ox of 87% on room air on the 19th.  Oxygen supplementation taper as able. 3. Acute diastolic congestive heart failure.  Dialysis to remove fluid 4. Anemia of chronic disease.  Will transfuse 1 unit of packed red blood cells today with a hemoglobin of 6.9.  Continue to monitor.  Epogen with dialysis. 5. Sinus bradycardia cut back on Coreg dose and continue to monitor. 6. hypoglycemia.  Get rid of D10  drip.  Continue to monitor closely.  Since hemoglobin A1c low at 3.9 she is not a diabetic. 7. End-stage renal disease on dialysis Monday Wednesday Friday 8. History of renal cancer status post cryoablation 9. History of chronic pancreatitis on Creon before meals      Code Status:     Code Status Orders  (From admission, onward)         Start     Ordered   08/10/20 1323  Full code  Continuous        08/10/20 1323        Code Status History    Date Active Date Inactive Code Status Order ID Comments User Context   06/02/2020 2251 06/25/2020 2316 Full Code [id number]  Elwyn Reach, MD ED   05/22/2020 1201 05/23/2020 2050 Full Code [id number]  Vashti Hey, MD ED   05/02/2020 2057 05/06/2020 1929 Full Code [id number]  Mansy, Arvella Merles, MD ED   04/13/2020 1458 04/14/2020 2239 Full Code [id number]  Judithann Sheen, Janalyn Harder, PA-C Inpatient   11/24/2019 2038 11/30/2019 1824 Full Code [id number]  Acheampong, Warnell Bureau, MD Inpatient   11/11/2019 0715 11/19/2019 0006 Full Code [id number]  Awilda Bill, NP ED   09/30/2019 2126 10/01/2019 2257 Full Code [id number]  Nolberto Hanlon,  MD ED   10/04/2018 1847 10/12/2018 0027 Full Code [id number]  Dustin Flock, MD Inpatient   06/18/2018 0449 06/20/2018 1642 Full Code [id number]  Arta Silence, MD ED   02/12/2018 1312 02/16/2018 0050 Full Code [id number]  Epifanio Lesches, MD ED   01/17/2018 1848 01/21/2018 1341 Full Code [id number]  Vaughan Basta, MD Inpatient   09/30/2017 1804 10/05/2017 2238 Full Code [id number]  Vaughan Basta, MD Inpatient   09/03/2017 2243 09/05/2017 1901 Full Code [id number]  Lance Coon, MD Inpatient   05/04/2017 1833 05/05/2017 2330 Full Code [id number]  Henreitta Leber, MD Inpatient   03/23/2017 1551 03/25/2017 1739 Full Code [id number]  Gladstone Lighter, MD Inpatient   04/21/2016 1559 04/21/2016 1717 Full Code [id number]  Nicholes Mango, MD Inpatient   04/08/2016 1415 04/11/2016 1947 Full Code [id number]  Theodoro Grist, MD Inpatient   03/15/2016 1322 03/20/2016 0019 Full Code [id number]  Bettey Costa, MD Inpatient   02/16/2016 1409 02/19/2016 2238 Full Code [id number]  Bettey Costa, MD ED   12/17/2015 1139 12/21/2015 2209 Full Code [id number]  Dustin Flock, MD ED   11/12/2015 1602 12/01/2015 1943 Full Code [id number]  Bettey Costa, MD Inpatient   10/07/2015 0937 10/12/2015 1959 Full Code [id number]  Fritzi Mandes, MD ED   10/03/2015 0355 10/05/2015 2230 Full Code [id number]  Lance Coon, MD Inpatient   08/31/2015 1600 09/03/2015 2209 Full Code [id number]  Demetrios Loll, MD ED   07/26/2015 1528 07/26/2015 2136 Full Code [id number]  Dionisio David, MD Inpatient   07/22/2015 1736 07/26/2015 1528 Full Code [id number]  Vaughan Basta, MD Inpatient   06/05/2015 2039 06/07/2015 2003 Full Code [id number]  Bettey Costa, MD Inpatient   05/21/2015 2142 05/24/2015 1626 Full Code [id number]  Henreitta Leber, MD Inpatient   04/24/2015 0855 04/26/2015 2211 Full Code [id number]  Demetrios Loll, MD Inpatient   04/01/2015 1558 04/05/2015 2007 Full Code [id number]  Epifanio Lesches, MD ED   03/06/2015 0805 03/09/2015 2111 Full Code [id number]  Juluis Mire, MD Inpatient   02/19/2015 1614 02/24/2015 1741 Full Code [id number]  Aldean Jewett, MD Inpatient   Advance Care Planning Activity     Family Communication: Son on the phone Disposition Plan: Status is: Inpatient  Dispo: The patient is from: Home              Anticipated d/c is to: Home              Anticipated d/c date is: We will need to set up a plan first              Patient currently with discitis possible osteomyelitis will have to have a biopsy and culture prior to any treatment.  We will get infectious disease consultation for tomorrow.  Time spent: 35 minutes in coordination of care speaking with interventional radiology.  [place]  [facility] [employer]

## 2020-08-12 NOTE — TOC Initial Note (Addendum)
3:30pmTransition of Care [facility]) - Initial/Assessment Note    Patient Details  Name: [name] MRN: [id number] Date of Birth: Mar 06, 1961  Transition [facility]) CM/SW Contact:    Meriel Flavors, LCSW Phone Number: 08/12/2020, 11:57 AM  Clinical Narrative:                 CSW Spoke with patient's son, [name] to discuss discharge planning. [name] stated patient had gone to [facility] when she was here in October and refuses to go to a facility now. [name] states she wasn't treated well there. CSW explained recommendations for SNF, [name] stated patient was getting PT at home and was doing well, that is what they would prefer. CSW confirmed and stated a request for attending to call [name] has been sent.   3:30pm  Dr. Leslye Peer spoke with patient's son, [name] and discussed discharge plan. [name] and patient prefer the patient go home with HH/PT at discharge. Dr. Leslye Peer is aware.      Patient Goals and CMS Choice        Expected Discharge Plan and Services                                                Prior Living Arrangements/Services                       Activities of Daily Living Home Assistive Devices/Equipment: CBG Meter, Cane (specify quad or straight), Walker (specify type) ADL Screening (condition at time of admission) Patient's cognitive ability adequate to safely complete daily activities?: Yes Is the patient deaf or have difficulty hearing?: No Does the patient have difficulty seeing, even when wearing glasses/contacts?: No Does the patient have difficulty concentrating, remembering, or making decisions?: No Patient able to express need for assistance with ADLs?: Yes Does the patient have difficulty dressing or bathing?: No Independently performs ADLs?: Yes (appropriate for developmental age) Does the patient have difficulty walking or climbing stairs?: Yes Weakness of Legs: Both Weakness of Arms/Hands: None  Permission Sought/Granted                   Emotional Assessment              Admission diagnosis:  Acute pulmonary edema ([place]) [J81.0] Hypoxia [R09.02] Fluid overload [E87.70] Hypervolemia, unspecified hypervolemia type [E87.70] Dyspnea, unspecified type [R06.00] Osteomyelitis ([place]) [M86.9] Patient Active Problem List   Diagnosis Date Noted  . Fluid overload 08/10/2020  . HLD (hyperlipidemia) 08/10/2020  . Chest pain 08/10/2020  . CAD (coronary artery disease) 08/10/2020  . GERD (gastroesophageal reflux disease) 08/10/2020  . Obesity (BMI 30-39.9) 06/06/2020  . Anemia in ESRD (end-stage renal disease) ([place]) 05/31/2020  . Type 1 diabetes mellitus ([employer]) 05/31/2020  . End-stage renal disease ([place]) 05/31/2020  . Hypertensive disorder 05/31/2020  . Arm pain 05/21/2020  . Cellulitis of right leg 05/02/2020  . ESRD (end stage renal disease) ([place]) 04/13/2020  . SOBOE (shortness of breath on exertion) 02/08/2020  . Hematoma of arm, left, subsequent encounter 11/24/2019  . Respiratory failure ([place])   . Shortness of breath   . Complication of arteriovenous dialysis fistula 11/13/2019  . PVD (peripheral vascular disease) ([place]) 11/13/2019  . Severe anemia 11/13/2019  . Pressure injury of skin 11/13/2019  . ESRD on hemodialysis ([place])   . Hemorrhagic shock ([place]) 11/11/2019  .  Leg pain 10/02/2019  . CAP (community acquired pneumonia) 10/01/2019  . Atherosclerosis of native arteries of extremity with intermittent claudication ([place]) 05/01/2019  . Right-sided headache   . COPD (chronic obstructive pulmonary disease) ([place]) 10/06/2018  . Syncope 10/04/2018  . Symptomatic anemia 06/18/2018  . Gastroparesis due to DM ([place]) 01/18/2018  . Complication of vascular access for dialysis 12/04/2017  . Osteomyelitis ([place]) 09/30/2017  . Carotid stenosis 06/18/2017  . Bilateral carotid artery stenosis 06/16/2017  . Benign essential HTN 05/19/2017  . CKD (chronic kidney disease) stage 5, GFR less than 15 ml/min (HCC) 05/19/2017  .  Hyperlipidemia, mixed 05/19/2017  . Shortness of breath 05/04/2017  . Rash, skin 12/16/2016  . Cellulitis of lower extremity 07/29/2016  . Chronic venous insufficiency 07/29/2016  . Lymphedema 07/29/2016  . TIA (transient ischemic attack) 04/21/2016  . Altered mental status 04/08/2016  . Hyperammonemia ([place]) 04/08/2016  . Elevated troponin 04/08/2016  . Depression 04/08/2016  . Depression, major, recurrent, severe with psychosis ([place]) 04/08/2016  . Blood in stool   . Intractable cyclical vomiting with nausea   . Reflux esophagitis   . Gastritis   . Generalized abdominal pain   . Uncontrollable vomiting   . Major depressive disorder, recurrent episode, moderate ([place]) 03/15/2016  . Adjustment disorder with mixed anxiety and depressed mood 03/15/2016  . Malnutrition of moderate degree 12/01/2015  . Renal mass   . Dyspnea   . Acute renal failure ([place])   . Respiratory failure ([place])   . High temperature 11/14/2015  . Encounter for central line placement   . Encounter for orogastric (OG) tube placement   . Nausea 11/12/2015  . Hyperkalemia 10/03/2015  . Diarrhea, unspecified 07/22/2015  . Pneumonia 05/21/2015  . Hypoglycemia 04/24/2015  . Unresponsiveness 04/24/2015  . Bradycardia 04/24/2015  . Hypothermia 04/24/2015  . Acute respiratory failure ([place]) 04/24/2015  . Acute on chronic diastolic CHF (congestive heart failure) ([place]) 04/05/2015  . Diabetic gastroparesis ([place]) 04/05/2015  . Hypokalemia 04/05/2015  . Generalized weakness 04/05/2015  . Acute pulmonary edema ([place]) 04/03/2015  . Nausea and vomiting 04/03/2015  . Hypoglycemia associated with diabetes ([place]) 04/03/2015  . Anemia of chronic disease 04/03/2015  . Secondary hyperparathyroidism ([place]) 04/03/2015  . Pressure ulcer 04/02/2015  . Acute respiratory failure with hypoxia ([place]) 04/01/2015  . Adjustment disorder with anxiety 03/14/2015  . Somatic symptom disorder, mild 03/08/2015  . Coronary artery disease involving  native coronary artery of native heart without angina pectoris   . Nausea & vomiting 03/06/2015  . Abdominal pain 03/06/2015  . Type II diabetes mellitus with renal manifestations ([place]) 03/06/2015  . HTN (hypertension) 03/06/2015  . Gastroparesis 02/24/2015  . Pleural effusion 02/19/2015  . Bacteremia due to Enterococcus 02/19/2015  . End-stage renal disease on hemodialysis ([place]) 02/19/2015  . Diarrhea 08/01/2014   PCP:  [place]:   [id number] [place], [address] [place] [address] Phone: [phone] Fax: [phone]     Social Determinants of Health (SDOH) Interventions    Readmission Risk Interventions Readmission Risk Prevention Plan 06/06/2020 05/03/2020  Transportation Screening Complete Complete  Medication Review Press photographer) Complete Complete  PCP or Specialist appointment within 3-5 days of discharge Complete Complete  HRI or Home Care Consult Complete -  SW Recovery Care/Counseling Consult - Complete  Palliative Care Screening Not Applicable Not [place] Not Applicable Not Applicable  Some recent data might be hidden

## 2020-08-12 NOTE — Progress Notes (Addendum)
Patient complaining of a headache and vomited 3 times (2 different episodes).   Gave oral dose of phenergan since patient is not continuously vomiting.   Ok per MD.    Will continue to monitor  1700-  Headache and lightheadedness resolved after rest.

## 2020-08-13 ENCOUNTER — Inpatient Hospital Stay: Payer: Medicare HMO

## 2020-08-13 DIAGNOSIS — D649 Anemia, unspecified: Secondary | ICD-10-CM

## 2020-08-13 DIAGNOSIS — J9601 Acute respiratory failure with hypoxia: Secondary | ICD-10-CM | POA: Diagnosis not present

## 2020-08-13 DIAGNOSIS — J810 Acute pulmonary edema: Secondary | ICD-10-CM | POA: Diagnosis not present

## 2020-08-13 DIAGNOSIS — I5033 Acute on chronic diastolic (congestive) heart failure: Secondary | ICD-10-CM | POA: Diagnosis not present

## 2020-08-13 DIAGNOSIS — N186 End stage renal disease: Secondary | ICD-10-CM | POA: Diagnosis not present

## 2020-08-13 DIAGNOSIS — M4644 Discitis, unspecified, thoracic region: Secondary | ICD-10-CM | POA: Diagnosis not present

## 2020-08-13 HISTORY — PX: IR INJECT/THERA/INC NEEDLE/CATH/PLC EPI/CERV/THOR W/IMG: IMG6128

## 2020-08-13 LAB — CBC WITH DIFFERENTIAL/PLATELET
Abs Immature Granulocytes: 0.02 10*3/uL (ref 0.00–0.07)
Basophils Absolute: 0 10*3/uL (ref 0.0–0.1)
Basophils Relative: 0 %
Eosinophils Absolute: 1.5 10*3/uL — ABNORMAL HIGH (ref 0.0–0.5)
Eosinophils Relative: 24 %
HCT: 27.5 % — ABNORMAL LOW (ref 36.0–46.0)
Hemoglobin: 8.8 g/dL — ABNORMAL LOW (ref 12.0–15.0)
Immature Granulocytes: 0 %
Lymphocytes Relative: 30 %
Lymphs Abs: 1.8 10*3/uL (ref 0.7–4.0)
MCH: 28.1 pg (ref 26.0–34.0)
MCHC: 32 g/dL (ref 30.0–36.0)
MCV: 87.9 fL (ref 80.0–100.0)
Monocytes Absolute: 0.4 10*3/uL (ref 0.1–1.0)
Monocytes Relative: 7 %
Neutro Abs: 2.4 10*3/uL (ref 1.7–7.7)
Neutrophils Relative %: 39 %
Platelets: 161 10*3/uL (ref 150–400)
RBC: 3.13 MIL/uL — ABNORMAL LOW (ref 3.87–5.11)
RDW: 18.1 % — ABNORMAL HIGH (ref 11.5–15.5)
WBC: 6.2 10*3/uL (ref 4.0–10.5)
nRBC: 0 % (ref 0.0–0.2)

## 2020-08-13 LAB — GLUCOSE, CAPILLARY
Glucose-Capillary: 121 mg/dL — ABNORMAL HIGH (ref 70–99)
Glucose-Capillary: 127 mg/dL — ABNORMAL HIGH (ref 70–99)
Glucose-Capillary: 161 mg/dL — ABNORMAL HIGH (ref 70–99)
Glucose-Capillary: 90 mg/dL (ref 70–99)

## 2020-08-13 LAB — RENAL FUNCTION PANEL
Albumin: 2.5 g/dL — ABNORMAL LOW (ref 3.5–5.0)
Anion gap: 10 (ref 5–15)
BUN: 16 mg/dL (ref 8–23)
CO2: 30 mmol/L (ref 22–32)
Calcium: 8.3 mg/dL — ABNORMAL LOW (ref 8.9–10.3)
Chloride: 96 mmol/L — ABNORMAL LOW (ref 98–111)
Creatinine, Ser: 5.64 mg/dL — ABNORMAL HIGH (ref 0.44–1.00)
GFR, Estimated: 8 mL/min — ABNORMAL LOW (ref >60)
Glucose, Bld: 65 mg/dL — ABNORMAL LOW (ref 70–99)
Phosphorus: 4 mg/dL (ref 2.5–4.6)
Potassium: 4.3 mmol/L (ref 3.5–5.1)
Sodium: 136 mmol/L (ref 135–145)

## 2020-08-13 LAB — COMPREHENSIVE METABOLIC PANEL
ALT: 9 U/L (ref 0–44)
AST: 15 U/L (ref 15–41)
Albumin: 2.5 g/dL — ABNORMAL LOW (ref 3.5–5.0)
Alkaline Phosphatase: 78 U/L (ref 38–126)
Anion gap: 4 — ABNORMAL LOW (ref 5–15)
BUN: 16 mg/dL (ref 8–23)
CO2: 31 mmol/L (ref 22–32)
Calcium: 7.8 mg/dL — ABNORMAL LOW (ref 8.9–10.3)
Chloride: 99 mmol/L (ref 98–111)
Creatinine, Ser: 5.62 mg/dL — ABNORMAL HIGH (ref 0.44–1.00)
GFR, Estimated: 8 mL/min — ABNORMAL LOW (ref >60)
Glucose, Bld: 66 mg/dL — ABNORMAL LOW (ref 70–99)
Potassium: 4.2 mmol/L (ref 3.5–5.1)
Sodium: 134 mmol/L — ABNORMAL LOW (ref 135–145)
Total Bilirubin: 0.3 mg/dL (ref 0.3–1.2)
Total Protein: 6.8 g/dL (ref 6.5–8.1)

## 2020-08-13 LAB — PATHOLOGIST SMEAR REVIEW

## 2020-08-13 MED ORDER — FENTANYL CITRATE (PF) 100 MCG/2ML IJ SOLN
INTRAMUSCULAR | Status: AC | PRN
Start: 2020-08-13 — End: 2020-08-13
  Administered 2020-08-13 (×2): 25 ug via INTRAVENOUS

## 2020-08-13 MED ORDER — MIDAZOLAM HCL 2 MG/2ML IJ SOLN
INTRAMUSCULAR | Status: AC | PRN
  Administered 2020-08-13 (×2): 0.5 mg via INTRAVENOUS

## 2020-08-13 MED ORDER — FENTANYL CITRATE (PF) 100 MCG/2ML IJ SOLN
INTRAMUSCULAR | Status: AC
  Filled 2020-08-13: qty 2

## 2020-08-13 MED ORDER — MIDAZOLAM HCL 2 MG/2ML IJ SOLN
INTRAMUSCULAR | Status: AC
  Filled 2020-08-13: qty 2

## 2020-08-13 NOTE — Consult Note (Addendum)
NAME: [name]  DOB: 09/27/1960  MRN: [id number]  Date/Time: 08/13/2020 11:59 AM  REQUESTING PROVIDER: Dr. Leslye Peer Subjective:  REASON FOR CONSULT: Osteomyelitis of thoracic spine Pt is in dialysis and does not want to be disturbed Pt known to me from recent hospitalization Chart reviewed  Pt is admitted for SOB and chest pain ? [name] is a 59 y.o. female with a history of End-stage renal disease, COPD, CAD, diabetes mellitus, recent hospitalization in September 2021  06/02/20-06/25/20 for infected AV graft with Enterococcus bacteremia and underwent removal of the hero graft on 06/13/2020.  TEE was negative Her course was complicated by near cardiac arrest with bradycardia and hypotension following TEE and she had to be intubated for a short period of time and also required pressors.and repeat blood culture from 06/07/2020 was also negative.  She received a total of 6 weeks of antibiotics including 4 weeks after the removal of the hero graft.  She initially was treated with IV ampicillin and gentamicin for short period of time and then transition to ampicillin monotherapy and eventually to IV vancomycin.   Completed it on 07/11/2020. Followed by vascular for access issues Presented to the ED on 08/10/2020 by EMS due to nonspecific shortness of breath and chest pain.  As per patient she woke up with chest pain and shortness of breath. Had received a full dialysis session on Wednesday.  She did not have any fever.  She was also complaining of swelling around her body and in her legs. Vitals in the ED BP of 119/88, heart rate 74, temperature 99, pulse ox 95%. WBC was 4.3, hemoglobin 7.4, platelet 146. Potassium 2.9, sodium 139, troponin VIII CT Angio to r/o PE was done Progressive sclerotic and destructive features along the anterior T7-T8 disc space with some adjacent paravertebral soft tissue thickening highly concerning for developing osteomyelitis with paravertebral abscess/phlegmon.  Chronic fusion across the T11-12 vertebral bodies. Background of more diffuse mild degenerative changes. No other acute or worrisome osseous features. No suspicious lytic or blastic lesions seen elsewhere. I am asked to see her for further management  Past medical history Diabetes mellitus, hypertension, hyperlipidemia, end-stage renal disease on dialysis, bilateral carotid artery stenosis, gastroparesis, diabetic neuropathy, COPD, CAD status post stents, acute pulmonary edema, acute diastolic CHF History of C. difficile colitis needing fecal transplant,  Past surgical history Removal of hero graft 06/13/20 Rt Appendectomy Cholecystectomy Fecal transplant for C. difficile colitis and December 2016 Hand surgery Hysterectomy Laser surgery/lens implants to eyes Right upper extremity graft for dialysis Amputation of the left great toe 10/02/2017 Biopsy temporal artery January 2020  Social history Former smoker 1 glass of wine per week     Family History  Problem Relation Age of Onset  . Kidney disease Mother   . Diabetes Mother   . Cancer Father   . Kidney disease Sister    Allergies  Allergen Reactions  . Ace Inhibitors Swelling and Anaphylaxis  . Ativan [Lorazepam] Other (See Comments)    Reaction:Hallucinations and headaches  . Compazine [Prochlorperazine Edisylate] Anaphylaxis, Nausea And Vomiting and Other (See Comments)    Other reaction(s): dystonia from this vs. Reglan, 23 Jul - patient relates that she takes promethazine frequently with no problems  . Sumatriptan Succinate Other (See Comments)    Other reaction(s): delirium and hallucinations per [facility] records  . Zofran [Ondansetron] Nausea And Vomiting    Per pt. she is allergic to zofran or will experience adverse reaction like hallucination   . Losartan  Nausea Only  . Prochlorperazine Other (See Comments)    Reaction:  Unknown . Patient does not remember reaction but she does have vertigo and anxiety along with n  and v at times. Could be used to treat any of these   . Reglan [Metoclopramide] Other (See Comments)    Per patient her Dr. Evelina Bucy her off it   . Scopolamine Other (See Comments)    Dizziness, also has vertigo already  . Tape Rash    Plastic tape causes rash  . Tapentadol Rash    ? Current Facility-Administered Medications  Medication Dose Route Frequency Provider Last Rate Last Admin  . 0.9 %  sodium chloride infusion (Manually program via Guardrails IV Fluids)   Intravenous Once Loletha Grayer, MD      . 0.9 %  sodium chloride infusion  250 mL Intravenous PRN Ivor Costa, MD      . acetaminophen (TYLENOL) tablet 650 mg  650 mg Oral Q6H PRN Ivor Costa, MD      . acetaminophen (TYLENOL) tablet 650 mg  650 mg Oral Once Loletha Grayer, MD      . albuterol (PROVENTIL) (2.5 MG/3ML) 0.083% nebulizer solution 2.5 mg  2.5 mg Inhalation Q4H PRN Ivor Costa, MD      . alum & mag hydroxide-simeth (MAALOX/MYLANTA) 200-200-20 MG/5ML suspension 15 mL  15 mL Oral Q6H PRN Ivor Costa, MD   15 mL at 08/11/20 1047  . amLODipine (NORVASC) tablet 10 mg  10 mg Oral Daily Ivor Costa, MD   10 mg at 08/12/20 3329  . ammonium lactate (LAC-HYDRIN) 12 % lotion 1 application  1 application Topical BID PRN Ivor Costa, MD      . ascorbic acid (VITAMIN C) tablet 500 mg  500 mg Oral Q breakfast Ivor Costa, MD   500 mg at 08/12/20 5188  . atorvastatin (LIPITOR) tablet 20 mg  20 mg Oral QPM Ivor Costa, MD   20 mg at 08/12/20 1649  . calcium carbonate (TUMS - dosed in mg elemental calcium) chewable tablet 500 mg of elemental calcium  500 mg of elemental calcium Oral Q6H PRN Ivor Costa, MD   500 mg of elemental calcium at 08/11/20 1428  . carvedilol (COREG) tablet 3.125 mg  3.125 mg Oral BID WC Nita Sells, MD   3.125 mg at 08/12/20 1650  . Chlorhexidine Gluconate Cloth 2 % PADS 6 each  6 each Topical Q0600 Ivor Costa, MD   6 each at 08/13/20 0407  . cholecalciferol (VITAMIN D) tablet 1,000 Units  1,000 Units Oral  Daily Ivor Costa, MD   1,000 Units at 08/12/20 0951  . cholestyramine light (PREVALITE) packet 4 g  4 g Oral BID Ivor Costa, MD      . dextromethorphan-guaiFENesin [facility] DM) 30-600 MG per 12 hr tablet 1 tablet  1 tablet Oral BID PRN Ivor Costa, MD      . dextrose 50 % solution 50 mL  50 mL Intravenous PRN Ivor Costa, MD      . diphenhydrAMINE (BENADRYL) tablet 25 mg  25 mg Oral Q6H PRN Ivor Costa, MD   25 mg at 08/13/20 0948  . epoetin alfa (EPOGEN) injection 8,000 Units  8,000 Units Intravenous Q M,W,F-HD Bhutani, Manpreet S, MD      . feeding supplement (ENSURE ENLIVE / ENSURE PLUS) liquid 237 mL  237 mL Oral Q24H Ivor Costa, MD   237 mL at 08/12/20 1921  . ferrous sulfate tablet 325 mg  325 mg Oral Q breakfast  Ivor Costa, MD   325 mg at 08/12/20 0951  . furosemide (LASIX) tablet 80 mg  80 mg Oral Daily Ivor Costa, MD   80 mg at 08/12/20 0951  . gabapentin (NEURONTIN) capsule 100 mg  100 mg Oral QPM Ivor Costa, MD   100 mg at 08/12/20 1649  . hydrALAZINE (APRESOLINE) injection 5 mg  5 mg Intravenous Q2H PRN Ivor Costa, MD      . hydrALAZINE (APRESOLINE) tablet 50 mg  50 mg Oral Q8H Ivor Costa, MD   50 mg at 08/12/20 2151  . HYDROcodone-acetaminophen (NORCO/VICODIN) 5-325 MG per tablet 1 tablet  1 tablet Oral TID PRN Ivor Costa, MD   1 tablet at 08/13/20 0951  . hydrOXYzine (ATARAX/VISTARIL) tablet 25 mg  25 mg Oral Daily Ivor Costa, MD   25 mg at 08/12/20 0957  . lipase/protease/amylase (CREON) capsule 36,000 Units  36,000 Units Oral TID Renella Cunas, MD   36,000 Units at 08/12/20 1656  . morphine 2 MG/ML injection 2 mg  2 mg Intravenous Q3H PRN Ivor Costa, MD   2 mg at 08/11/20 1043  . multivitamin (RENA-VIT) tablet 1 tablet  1 tablet Oral Daily Ivor Costa, MD   1 tablet at 08/12/20 0953  . mupirocin ointment (BACTROBAN) 2 % 1 application  1 application Topical Daily PRN Ivor Costa, MD      . nitroGLYCERIN (NITROSTAT) SL tablet 0.4 mg  0.4 mg Sublingual Q5 min PRN Ivor Costa, MD      .  omega-3 acid ethyl esters (LOVAZA) capsule 1 g  1 capsule Oral BID Ivor Costa, MD   1 g at 08/12/20 1650  . pantoprazole (PROTONIX) EC tablet 40 mg  40 mg Oral Daily Ivor Costa, MD   40 mg at 08/12/20 1000  . promethazine (PHENERGAN) tablet 25 mg  25 mg Oral Q6H PRN Ivor Costa, MD   25 mg at 08/13/20 0948  . saccharomyces boulardii (FLORASTOR) capsule 250 mg  250 mg Oral Daily Ivor Costa, MD   250 mg at 08/12/20 0950  . sevelamer carbonate (RENVELA) tablet 800 mg  800 mg Oral BID WC Bhutani, Manpreet S, MD   800 mg at 08/12/20 1649  . sodium chloride flush (NS) 0.9 % injection 3 mL  3 mL Intravenous Q12H Ivor Costa, MD   3 mL at 08/13/20 0950  . sodium chloride flush (NS) 0.9 % injection 3 mL  3 mL Intravenous PRN Ivor Costa, MD      . vitamin B-12 (CYANOCOBALAMIN) tablet 1,000 mcg  1,000 mcg Oral Daily Ivor Costa, MD   1,000 mcg at 08/12/20 5009     Abtx:  Anti-infectives (From admission, onward)   None      REVIEW OF SYSTEMS:  Const: negative fever, negative chills, negative weight loss Eyes: negative diplopia or visual changes, negative eye pain ENT: negative coryza, negative sore throat Resp:  cough, , dyspnea Cards:  chest pain, palpitations, lower extremity edema GU: negative for frequency, dysuria and hematuria GI: has nausea, constipation Skin: negative for rash and pruritus Heme: negative for easy bruising and gum/nose bleeding FG:[id number] weakness Neurolo:nhas  headaches, dizziness,no vertigo, memory problems  Psych: anxiety,  Endocrine: negative for thyroid, diabetes Allergy/Immunology-as above Objective:  VITALS:  BP (!) 129/51 (BP Location: Left Arm)   Pulse 61   Temp 98.2 F (36.8 C)   Resp 17   Ht 5\' 5"  (1.651 m)   Wt 82.3 kg   SpO2 100%   BMI 30.19 kg/m  PHYSICAL EXAM:  General: Alert, , no distress, chronically ill,pale Head: Normocephalic, without obvious abnormality, atraumatic. Eyes: Conjunctivae clear, anicteric sclerae. Pupils are equal ENT did  not examine Neck: Supple,  Left IJ permacath Back: did not examine Lungs: b/l air entry crepts bases Heart: s1s2 Abdomen: Soft, non-tender,abd wall edema. Bowel sounds normal. No masses Extremities: edema legs Skin: No obvious rash- exam limited Lymph: Cervical, supraclavicular normal. Neurologic: Grossly non-focal Pertinent Labs Lab Results CBC    Component Value Date/Time   WBC 4.0 08/12/2020 0425   RBC 2.45 (L) 08/12/2020 0425   HGB 6.9 (L) 08/12/2020 0425   HGB 10.5 (L) 09/15/2014 0948   HCT 22.1 (L) 08/12/2020 0425   HCT 34.2 (L) 09/15/2014 0948   PLT 115 (L) 08/12/2020 0425   PLT 203 09/15/2014 0948   MCV 90.2 08/12/2020 0425   MCV 97 09/15/2014 0948   MCH 28.2 08/12/2020 0425   MCHC 31.2 08/12/2020 0425   RDW 19.2 (H) 08/12/2020 0425   RDW 16.5 (H) 09/15/2014 0948   LYMPHSABS 1.4 08/12/2020 0425   LYMPHSABS 2.5 05/31/2014 0432   MONOABS 0.4 08/12/2020 0425   MONOABS 0.5 05/31/2014 0432   EOSABS 1.1 (H) 08/12/2020 0425   EOSABS 0.2 05/31/2014 0432   BASOSABS 0.0 08/12/2020 0425   BASOSABS 0.0 05/31/2014 0432    CMP Latest Ref Rng & Units 08/12/2020 08/11/2020 08/10/2020  Glucose 70 - 99 mg/dL 101(H) 64(L) 46(L)  BUN 8 - 23 mg/dL 10 6(L) 8  Creatinine 0.44 - 1.00 mg/dL 4.28(H) 3.06(H) 3.74(H)  Sodium 135 - 145 mmol/L 135 137 139  Potassium 3.5 - 5.1 mmol/L 3.9 3.3(L) 2.9(L)  Chloride 98 - 111 mmol/L 95(L) 99 99  CO2 22 - 32 mmol/L 29 29 28   Calcium 8.9 - 10.3 mg/dL 8.2(L) 7.5(L) 7.7(L)  Total Protein 6.5 - 8.1 g/dL 6.0(L) - 6.1(L)  Total Bilirubin 0.3 - 1.2 mg/dL 0.5 - 0.5  Alkaline Phos 38 - 126 U/L 70 - 68  AST 15 - 41 U/L 15 - 17  ALT 0 - 44 U/L 9 - 10      Microbiology: Recent Results (from the past 240 hour(s))  Resp Panel by RT-PCR (Flu A&B, Covid) Nasopharyngeal Swab     Status: None   Collection Time: 08/10/20 11:20 AM   Specimen: Nasopharyngeal Swab; Nasopharyngeal(NP) swabs in vial transport medium  Result Value Ref Range Status   SARS  Coronavirus 2 by RT PCR NEGATIVE NEGATIVE Final    Comment: (NOTE) SARS-CoV-2 target nucleic acids are NOT DETECTED.  The SARS-CoV-2 RNA is generally detectable in upper respiratory specimens during the acute phase of infection. The lowest concentration of SARS-CoV-2 viral copies this assay can detect is 138 copies/mL. A negative result does not preclude SARS-Cov-2 infection and should not be used as the sole basis for treatment or other patient management decisions. A negative result may occur with  improper specimen collection/handling, submission of specimen other than nasopharyngeal swab, presence of viral mutation(s) within the areas targeted by this assay, and inadequate number of viral copies(<138 copies/mL). A negative result must be combined with clinical observations, patient history, and epidemiological information. The expected result is Negative.  Fact Sheet for Patients:  EntrepreneurPulse.com.au  Fact Sheet for Healthcare Providers:  IncredibleEmployment.be  This test is no t yet approved or cleared by the [place] FDA and  has been authorized for detection and/or diagnosis of SARS-CoV-2 by FDA under an Emergency Use Authorization (EUA). This EUA will remain  in effect (meaning  this test can be used) for the duration of the COVID-19 declaration under Section 564(b)(1) of the Act, 21 U.S.C.section 360bbb-3(b)(1), unless the authorization is terminated  or revoked sooner.       Influenza A by PCR NEGATIVE NEGATIVE Final   Influenza B by PCR NEGATIVE NEGATIVE Final    Comment: (NOTE) The Xpert Xpress SARS-CoV-2/FLU/RSV plus assay is intended as an aid in the diagnosis of influenza from Nasopharyngeal swab specimens and should not be used as a sole basis for treatment. Nasal washings and aspirates are unacceptable for Xpert Xpress SARS-CoV-2/FLU/RSV testing.  Fact Sheet for  Patients: EntrepreneurPulse.com.au  Fact Sheet for Healthcare Providers: IncredibleEmployment.be  This test is not yet approved or cleared by the [place] FDA and has been authorized for detection and/or diagnosis of SARS-CoV-2 by FDA under an Emergency Use Authorization (EUA). This EUA will remain in effect (meaning this test can be used) for the duration of the COVID-19 declaration under Section 564(b)(1) of the Act, 21 U.S.C. section 360bbb-3(b)(1), unless the authorization is terminated or revoked.  Performed at [facility], [address] [place], [place] [address]   MRSA PCR Screening     Status: None   Collection Time: 08/10/20 10:12 PM   Specimen: Nasal Mucosa; Nasopharyngeal  Result Value Ref Range Status   MRSA by PCR NEGATIVE NEGATIVE Final    Comment:        The GeneXpert MRSA Assay (FDA approved for NASAL specimens only), is one component of a comprehensive MRSA colonization surveillance program. It is not intended to diagnose MRSA infection nor to guide or monitor treatment for MRSA infections. Performed at [facility], [address] [place], [place] [address]   Culture, blood (Routine X 2) w Reflex to ID Panel     Status: None (Preliminary result)   Collection Time: 08/11/20 12:51 AM   Specimen: BLOOD  Result Value Ref Range Status   Specimen Description BLOOD BLOOD LEFT HAND  Final   Special Requests   Final    BOTTLES DRAWN AEROBIC ONLY Blood Culture results may not be optimal due to an inadequate volume of blood received in culture bottles   Culture   Final    NO GROWTH 2 DAYS Performed at [facility], [address] [place], [place] [address]    Report Status PENDING  Incomplete  Culture, blood (Routine X 2) w Reflex to ID Panel     Status: None (Preliminary result)   Collection Time: 08/11/20  1:03 AM   Specimen: BLOOD  Result Value Ref Range Status   Specimen Description  BLOOD BLOOD LEFT HAND  Final   Special Requests   Final    BOTTLES DRAWN AEROBIC AND ANAEROBIC Blood Culture results may not be optimal due to an inadequate volume of blood received in culture bottles   Culture   Final    NO GROWTH 2 DAYS Performed at [facility], [address], [place], [place] [address]    Report Status PENDING  Incomplete    IMAGING RESULTS:  I have personally reviewed the films ?Findings consistent with volume overload/CHF Impression/Recommendation Presented with SOB and chest pain  CHF/Acute pulmonary edema and acute hypoxic resp failure- getting dialysis Anemia Incidental finding of Progressive sclerotic and destructive features along the anterior T7-T8 disc space with some adjacent paravertebral soft tissue thickening highly concerning for developing osteomyelitis with paravertebral abscess/phlegmon on CT chest Underwent aspiration of disc space and 1 cc of bloody fluid sent for culture  Not on any antibiotics currently- stable with normal wbc, nofever- will hold off on starting antibiotics until culture /gram stain shows something- Do not want to start antibiotics prematurely as a repeat biopsy/aspiration may needed if current culture neg If she decompensates or clinically her condition changes then would start vancomycin and cefepime.? ? ESRD on dialysis  Recent enterococcus bacteremia- treated with 6 weeks of IV antibiotic TEE was negative  Recent Hero graft infection with enterococcus /p removal and got appropriate antibiotics ( 4 weeks after removal and 2 weeks before)  ___________________________________________________ Discussed with patient, requesting provider

## 2020-08-13 NOTE — Consult Note (Signed)
Chief Complaint: Concern for osteomyelitis / discitis and cytology. Request is for T 7 - 8 aspiration/ biopsy.  Referring Physician(s): Dr. Phillip Heal  Supervising Physician: Sandi Mariscal  Patient Status: [facility] - In-pt  History of Present Illness: [name] is a 59 y.o. female History of  ESRD on HD, CAD, CHF, HTN, RCC. Presented to the ED at [facility] with  persistent and worsening SHOB.  The Patient was found to be in acute  hypoxic respiratory failure. CT angio performed on 11.19.21. CT also showed T7-T8 disc space concerning for osteomyelitis. MR Thoracic from 11.20.21 reads Intervertebral disc space narrowing with increased fluid signal intensity seen within the T7-8 interspace anteriorly. Mild associated endplate irregularity within the adjacent T7 and T8 endplates. Abnormal edema within the T7 and T8 vertebral bodies. Possible minimal edema within the adjacent paraspinous soft tissues.While these findings are somewhat nonspecific, appearance is most concerning for possible osteomyelitis discitis. No discrete paraspinous collection. No epidural abscess or other collection identified.Team is requesting an aspiration/ biopsy for culture and cytology  Past Medical History:  Diagnosis Date  . Anemia   . Anginal pain ([place])   . Anxiety   . Arthritis   . Asthma   . Broken wrist   . Bronchitis   . chronic diastolic CHF 03/21/5283  . Chronic kidney disease   . COPD (chronic obstructive pulmonary disease) ([place])   . Coronary artery disease    a. cath 2013: stenting to RCA (report not available); b. cath 2014: LM nl, pLAD 40%, mLAD nl, ost LCx 40%, mid LCx nl, pRCA 30% @ site of prior stent, mRCA 50%  . Depression   . Diabetes mellitus ([place])   . Diabetes mellitus without complication ([place])   . Diabetic neuropathy ([place])   . dialysis 2006  . Diverticulosis   . Dizziness   . Dyspnea   . Elevated lipids   . Environmental and seasonal allergies   . ESRD (end stage renal disease) on  dialysis ([place])    M-W-F  . Gastroparesis   . GERD (gastroesophageal reflux disease)   . Headache   . History of anemia due to chronic kidney disease   . History of hiatal hernia   . HOH (hard of hearing)   . Hx of pancreatitis 2015  . Hypertension   . Lower extremity edema   . Mitral regurgitation    a. echo 10/2013: EF 62%, noWMA, mildly dilated LA, mild to mod MR/TR, GR1DD  . Myocardial infarction ([place])   . Orthopnea   . Parathyroid abnormality ([place])   . Peripheral arterial disease ([place])   . Pneumonia   . Renal cancer ([place])   . Renal insufficiency    Pt is on dialysis on M,W + F.  . Wheezing     Past Surgical History:  Procedure Laterality Date  . A/V SHUNTOGRAM Left 01/20/2018   Procedure: A/V SHUNTOGRAM;  Surgeon: Algernon Huxley, MD;  Location: [place] [facility];  Service: Cardiovascular;  Laterality: Left;  . ABDOMINAL HYSTERECTOMY  1992  . AMPUTATION TOE Left 10/02/2017   Procedure: AMPUTATION TOE-LEFT GREAT TOE;  Surgeon: Albertine Patricia, DPM;  Location: [facility];  Service: Podiatry;  Laterality: Left;  . APPENDECTOMY    . APPLICATION OF WOUND VAC N/A 11/25/2019   Procedure: APPLICATION OF WOUND VAC;  Surgeon: Katha Cabal, MD;  Location: [facility];  Service: Vascular;  Laterality: N/A;  . ARTERY BIOPSY Right 10/11/2018   Procedure: BIOPSY TEMPORAL ARTERY;  Surgeon: [name],  Arn Medal, MD;  Location: [facility];  Service: General;  Laterality: Right;  . CARDIAC CATHETERIZATION Left 07/26/2015   Procedure: Left Heart Cath and Coronary Angiography;  Surgeon: Dionisio David, MD;  Location: [place] [facility];  Service: Cardiovascular;  Laterality: Left;  . CATARACT EXTRACTION W/ INTRAOCULAR LENS IMPLANT Right   . CATARACT EXTRACTION W/PHACO Left 03/10/2017   Procedure: CATARACT EXTRACTION PHACO AND INTRAOCULAR LENS PLACEMENT (IOC);  Surgeon: Birder Robson, MD;  Location: [facility];  Service: Ophthalmology;  Laterality: Left;  [place] 00:51.9 AP% 14.2 CDE 7.39 fluid pack lot  # [id number]  . CENTRAL LINE INSERTION Right 11/11/2019   Procedure: CENTRAL LINE INSERTION;  Surgeon: Katha Cabal, MD;  Location: [facility];  Service: Vascular;  Laterality: Right;  . CENTRAL LINE INSERTION  11/25/2019   Procedure: CENTRAL LINE INSERTION;  Surgeon: Katha Cabal, MD;  Location: [facility];  Service: Vascular;;  . CHOLECYSTECTOMY    . COLONOSCOPY WITH PROPOFOL N/A 08/12/2016   Procedure: COLONOSCOPY WITH PROPOFOL;  Surgeon: Lollie Sails, MD;  Location: [facility] ENDOS[facility];  Service: Endoscopy;  Laterality: N/A;  . DIALYSIS FISTULA CREATION Left    upper arm  . dialysis grafts    . DIALYSIS/PERMA CATHETER INSERTION N/A 11/14/2019   Procedure: DIALYSIS/PERMA CATHETER INSERTION;  Surgeon: Algernon Huxley, MD;  Location: [place] [facility];  Service: Cardiovascular;  Laterality: N/A;  . DIALYSIS/PERMA CATHETER INSERTION N/A 02/03/2020   Procedure: DIALYSIS/PERMA CATHETER INSERTION;  Surgeon: Katha Cabal, MD;  Location: [place] [facility];  Service: Cardiovascular;  Laterality: N/A;  . DIALYSIS/PERMA CATHETER INSERTION N/A 06/19/2020   Procedure: DIALYSIS/PERMA CATHETER INSERTION;  Surgeon: Katha Cabal, MD;  Location: [place] [facility];  Service: Cardiovascular;  Laterality: N/A;  . DIALYSIS/PERMA CATHETER REMOVAL N/A 05/25/2020   Procedure: DIALYSIS/PERMA CATHETER REMOVAL;  Surgeon: Katha Cabal, MD;  Location: [place] [facility];  Service: Cardiovascular;  Laterality: N/A;  . ESOPHAGOGASTRODUODENOSCOPY N/A 03/08/2015   Procedure: ESOPHAGOGASTRODUODENOSCOPY (EGD);  Surgeon: Manya Silvas, MD;  Location: [facility] ENDOSCOPY;  Service: Endoscopy;  Laterality: N/A;  . ESOPHAGOGASTRODUODENOSCOPY (EGD) WITH PROPOFOL N/A 03/18/2016   Procedure: ESOPHAGOGASTRODUODENOSCOPY (EGD) WITH PROPOFOL;  Surgeon: [name], MD;  Location: [facility] ENDOSCOPY;  Service: Endoscopy;  Laterality: N/A;  . EYE SURGERY Right 2018  . FECAL TRANSPLANT N/A 08/23/2015   Procedure: FECAL  TRANSPLANT;  Surgeon: Manya Silvas, MD;  Location: [facility] ENDOSCOPY;  Service: Endoscopy;  Laterality: N/A;  . HAND SURGERY Bilateral   . HEMATOMA EVACUATION Left 11/25/2019   Procedure: EVACUATION HEMATOMA;  Surgeon: Katha Cabal, MD;  Location: [facility];  Service: Vascular;  Laterality: Left;  . I & D EXTREMITY Left 11/25/2019   Procedure: IRRIGATION AND DEBRIDEMENT EXTREMITY;  Surgeon: Katha Cabal, MD;  Location: [facility];  Service: Vascular;  Laterality: Left;  . IR FLUORO GUIDE CV LINE RIGHT  04/06/2020  . IR RADIOLOGIST EVAL & MGMT  07/28/2019  . IR RADIOLOGIST EVAL & MGMT  08/11/2019  . LIGATION OF ARTERIOVENOUS  FISTULA Left 11/11/2019  . LIGATION OF ARTERIOVENOUS  FISTULA Left 11/11/2019   Procedure: LIGATION OF ARTERIOVENOUS  FISTULA;  Surgeon: Katha Cabal, MD;  Location: [facility];  Service: Vascular;  Laterality: Left;  . LIGATIONS OF HERO GRAFT Right 06/13/2020   Procedure: LIGATION / REMOVAL OF RIGHT HERO GRAFT;  Surgeon: Katha Cabal, MD;  Location: [facility];  Service: Vascular;  Laterality: Right;  . PERIPHERAL VASCULAR CATHETERIZATION N/A 12/20/2015   Procedure:  Thrombectomy of dialysis access versus permcath placement;  Surgeon: Algernon Huxley, MD;  Location: [place] [facility];  Service: Cardiovascular;  Laterality: N/A;  . PERIPHERAL VASCULAR CATHETERIZATION N/A 12/20/2015   Procedure: A/V Shunt Intervention;  Surgeon: Algernon Huxley, MD;  Location: [place] [facility];  Service: Cardiovascular;  Laterality: N/A;  . PERIPHERAL VASCULAR CATHETERIZATION N/A 12/20/2015   Procedure: A/V Shuntogram/Fistulagram;  Surgeon: Algernon Huxley, MD;  Location: [place] [facility];  Service: Cardiovascular;  Laterality: N/A;  . PERIPHERAL VASCULAR CATHETERIZATION N/A 01/02/2016   Procedure: A/V Shuntogram/Fistulagram;  Surgeon: Algernon Huxley, MD;  Location: [place] [facility];  Service: Cardiovascular;  Laterality: N/A;  . PERIPHERAL VASCULAR CATHETERIZATION N/A 01/02/2016    Procedure: A/V Shunt Intervention;  Surgeon: Algernon Huxley, MD;  Location: [place] [facility];  Service: Cardiovascular;  Laterality: N/A;  . TEE WITHOUT CARDIOVERSION N/A 06/11/2020   Procedure: TRANSESOPHAGEAL ECHOCARDIOGRAM (TEE);  Surgeon: Teodoro Spray, MD;  Location: [facility];  Service: Cardiovascular;  Laterality: N/A;  . UPPER EXTREMITY VENOGRAPHY Right 01/18/2020   Procedure: UPPER EXTREMITY VENOGRAPHY;  Surgeon: Katha Cabal, MD;  Location: [place] [facility];  Service: Cardiovascular;  Laterality: Right;  Marland Kitchen VASCULAR ACCESS DEVICE INSERTION Right 04/13/2020   Procedure: INSERTION OF HERO VASCULAR ACCESS DEVICE (GRAFT);  Surgeon: Katha Cabal, MD;  Location: [facility];  Service: Vascular;  Laterality: Right;    Allergies: Ace inhibitors, Ativan [lorazepam], Compazine [prochlorperazine edisylate], Sumatriptan succinate, Zofran [ondansetron], Losartan, Prochlorperazine, Reglan [metoclopramide], Scopolamine, Tape, and Tapentadol  Medications: Prior to Admission medications   Medication Sig Start Date End Date Taking? Authorizing Provider  albuterol (PROVENTIL) (2.5 MG/3ML) 0.083% nebulizer solution Take 2.5 mg by nebulization every 4 (four) hours as needed for wheezing or shortness of breath.   Yes [provider]  albuterol (VENTOLIN HFA) 108 (90 Base) MCG/ACT inhaler Inhale 2 puffs into the lungs every 4 (four) hours as needed for wheezing or shortness of breath. 11/09/19  Yes [provider]  alum & mag hydroxide-simeth (MAALOX/MYLANTA) 200-200-20 MG/5ML suspension Take 15 mLs by mouth every 6 (six) hours as needed for indigestion or heartburn. 06/25/20  Yes Enzo Bi, MD  amLODipine (NORVASC) 10 MG tablet Take 1 tablet (10 mg total) by mouth daily. 06/25/20  Yes Enzo Bi, MD  ammonium lactate (LAC-HYDRIN) 12 % lotion Apply 1 application topically 2 (two) times daily as needed for dry skin.    Yes [provider]  Ascorbic Acid (VITA-C PO) Take 1  tablet by mouth daily.   Yes [provider]  aspirin EC 81 MG tablet Take 81 mg by mouth daily.   Yes [provider]  atorvastatin (LIPITOR) 20 MG tablet Take 20 mg by mouth every evening.  10/24/19  Yes [provider]  calcium carbonate (TUMS - DOSED IN MG ELEMENTAL CALCIUM) 500 MG chewable tablet Chew 2.5 tablets (500 mg of elemental calcium total) by mouth every 6 (six) hours as needed for indigestion. 06/25/20  Yes Enzo Bi, MD  carvedilol (COREG) 6.25 MG tablet Take 6.25 mg by mouth 2 (two) times daily with a meal.   Yes [provider]  Cholecalciferol (VITAMIN D3) 25 MCG (1000 UT) CAPS Take 1,000 Units by mouth daily.    Yes [provider]  cholestyramine light (PREVALITE) 4 g packet Take 4 g by mouth 2 (two) times daily.   Yes [provider]  CREON 12000 units CPEP capsule Take 36,000 Units by mouth 3 (three)  times daily before meals.  10/21/19  Yes [provider]  cyanocobalamin 1000 MCG tablet Take 1,000 mcg by mouth daily.    Yes [provider]  diphenhydrAMINE (BENADRYL) 25 MG tablet Take 25 mg by mouth every 6 (six) hours as needed for itching.   Yes [provider]  epoetin alfa (EPOGEN) 10000 UNIT/ML injection Inject 1 mL (10,000 Units total) into the vein every Monday, Wednesday, and Friday with hemodialysis. 06/27/20  Yes Enzo Bi, MD  feeding supplement, ENSURE ENLIVE, (ENSURE ENLIVE) LIQD Take 237 mLs by mouth daily. 06/26/20  Yes Enzo Bi, MD  Ferrous Sulfate (IRON) 325 (65 Fe) MG TABS Take 325 mg by mouth daily.    Yes [provider]  furosemide (LASIX) 80 MG tablet Take 80 mg by mouth daily. 05/08/20  Yes [provider]  gabapentin (NEURONTIN) 100 MG capsule Take 100 mg by mouth every evening.    Yes [provider]  hydrALAZINE (APRESOLINE) 50 MG tablet Take 1 tablet (50 mg total) by mouth every 8 (eight) hours. 06/25/20  Yes Enzo Bi, MD  HYDROcodone-acetaminophen  (NORCO/VICODIN) 5-325 MG tablet Take 1 tablet by mouth 3 (three) times daily as needed for moderate pain or severe pain. 07/26/20  Yes [provider]  hydrOXYzine (ATARAX/VISTARIL) 25 MG tablet Take 25 mg by mouth daily.    Yes [provider]  multivitamin (RENA-VIT) TABS tablet Take 1 tablet by mouth daily.    Yes [provider]  mupirocin ointment (BACTROBAN) 2 % Apply 1 application topically daily as needed (leg rash).  11/09/19  Yes [provider]  nitroGLYCERIN (NITROSTAT) 0.4 MG SL tablet Place 0.4 mg under the tongue every 5 (five) minutes as needed for chest pain.    Yes [provider]  Omega-3 300 MG CAPS Take 300 mg by mouth 2 (two) times daily. Every other day opposite omega XL   Yes [provider]  pantoprazole (PROTONIX) 40 MG tablet Take 40 mg by mouth daily. 08/28/19  Yes [provider]  polyethylene glycol (MIRALAX / GLYCOLAX) 17 g packet Take 17 g by mouth daily. 06/25/20  Yes Enzo Bi, MD  Probiotic Product (PROBIOTIC PO) Take 1 capsule by mouth daily.   Yes [provider]  promethazine (PHENERGAN) 25 MG tablet Take 1 tablet (25 mg total) by mouth every 6 (six) hours as needed for nausea or vomiting. 06/25/20  Yes Enzo Bi, MD  sevelamer carbonate (RENVELA) 800 MG tablet Take 800 mg by mouth 4 (four) times daily. (at meals and with a snack)   Yes [provider]     Family History  Problem Relation Age of Onset  . Kidney disease Mother   . Diabetes Mother   . Cancer Father   . Kidney disease Sister     Social History   Socioeconomic History  . Marital status: Divorced    Spouse name: Not on file  . Number of children: Not on file  . Years of education: Not on file  . Highest education level: Not on file  Occupational History  . Not on file  Tobacco Use  . Smoking status: Never Smoker  . Smokeless tobacco: Never Used  Vaping Use  . Vaping Use: Never used  Substance and Sexual  Activity  . Alcohol use: Not Currently    Comment: glass wine week per pt  . Drug use: Yes    Types: Marijuana    Comment: once a day  . Sexual activity: Never  Other Topics Concern  . Not on file  Social History Narrative   ** Merged History Encounter **       Social Determinants of Health   Financial Resource Strain:   . Difficulty of Paying Living Expenses: Not on file  Food Insecurity:   . Worried About Charity fundraiser in the Last Year: Not on file  . Ran Out of Food in the Last Year: Not on file  Transportation Needs:   . Lack of Transportation (Medical): Not on file  . Lack of Transportation (Non-Medical): Not on file  Physical Activity:   . Days of Exercise per Week: Not on file  . Minutes of Exercise per Session: Not on file  Stress:   . Feeling of Stress : Not on file  Social Connections:   . Frequency of Communication with Friends and Family: Not on file  . Frequency of Social Gatherings with Friends and Family: Not on file  . Attends Religious Services: Not on file  . Active Member of Clubs or Organizations: Not on file  . Attends Archivist Meetings: Not on file  . Marital Status: Not on file      Review of Systems: A 12 point ROS discussed and pertinent positives are indicated in the HPI above.  All other systems are negative.  Review of Systems  Constitutional: Positive for fatigue. Negative for fever.  HENT: Negative for congestion.   Respiratory: Positive for shortness of breath. Negative for cough.   Gastrointestinal: Negative for abdominal pain, diarrhea, nausea and vomiting.    Vital Signs: BP 134/61 (BP Location: Left Arm)   Pulse 60   Temp 98.2 F (36.8 C) (Oral)   Resp 18   Ht 5\' 5"  (1.651 m)   Wt 181 lb 7 oz (82.3 kg)   SpO2 100%   BMI 30.19 kg/m   Physical Exam Vitals and nursing note reviewed.  Constitutional:      Appearance: She is well-developed.  HENT:     Head: Normocephalic and atraumatic.  Eyes:      Conjunctiva/sclera: Conjunctivae normal.  Cardiovascular:     Rate and Rhythm: Normal rate and regular rhythm.  Pulmonary:     Effort: Pulmonary effort is normal.     Breath sounds: Normal breath sounds.  Musculoskeletal:     Cervical back: Normal range of motion.  Neurological:     Mental Status: She is alert and oriented to person, place, and time.     Imaging: CT ANGIO CHEST PE W OR WO CONTRAST  Addendum Date: 08/10/2020   ADDENDUM REPORT: 08/10/2020 22:32 ADDENDUM: Critical Value/emergent results were called by telephone at the time of addendum submission on 08/10/2020 at 10:32 pm to provider NP Randol Kern, who verbally acknowledged these results. Electronically Signed   By: Lovena Le M.D.   On: 08/10/2020 22:32   Result Date: 08/10/2020 CLINICAL DATA:  PE suspected, history of ESRD, CHF, CAD, and renal cell carcinoma EXAM: CT ANGIOGRAPHY CHEST WITH CONTRAST TECHNIQUE: Multidetector CT imaging of the chest was performed using the standard protocol during bolus administration of intravenous contrast. Multiplanar CT image reconstructions and MIPs were obtained to evaluate the vascular anatomy. CONTRAST:  60mL OMNIPAQUE IOHEXOL 350 MG/ML SOLN COMPARISON:  CT 06/11/2020 FINDINGS: Cardiovascular: Satisfactory opacification the pulmonary arteries to the segmental level. No central pulmonary artery filling defects are identified. Central pulmonary arteries are enlarged. May reflect some chronic pulmonary artery hypertension given similar to prior. Cardiomegaly with four-chamber enlargement. Small volume pericardial effusion  and fluid in the pericardial recesses. Suboptimal opacification of the aorta for luminal assessment. Atherosclerotic plaque within the normal caliber aorta. No hyperdense mural thickening or plaque displacement. No clear acute luminal abnormality is seen. No significant focal periaortic stranding or hemorrhage. Contrast bolus is administered via the right upper extremity  cephalic vein. Irregularity of the subclavian vein as it passes through the thoracic inlet on the right. Extensive reflux of contrast throughout the right upper extremity possibly related to some degree of outlet obstruction at this level. Non opacification of a right axillary venous stent as well as a left subclavian stent through which traverses a dual lumen left subclavian approach dialysis catheter terminating at the right atrium. Mediastinum/Nodes: Edematous changes of the mediastinum, similar to increased from prior. Scattered enlarged mediastinal and hilar lymph nodes are again seen, overall burden quite similar to the comparison study with larger nodes including a 15 mm AP window node on the left (4/24) and a 15 mm precarinal lymph node (4/29). A 14 mm right hilar node is present (4/36) as well. Some prominent though nonenlarged axillary nodes are seen bilaterally. No acute abnormality of the trachea. Some mild nonspecific esophageal thickening is noted. Thyroid gland is unremarkable. Lungs/Pleura: Significant redistribution of the pulmonary vascularity with diffuse interlobular septal thickening and fissural thickening with bilateral pleural effusions, increasing from the comparison exam in now tracking within the fissures. Some adjacent areas of likely passive atelectasis are seen. No pneumothorax. Upper Abdomen: Edematous changes in the upper abdomen. No other acute or significant upper abdominal findings. Vascular calcifications in the abdomen as well. Musculoskeletal: Extensive severe body wall edema. Reflux of contrast into the right upper quadrant as above. Progressive sclerotic and destructive features along the anterior T7-T8 disc space with some adjacent paravertebral soft tissue thickening highly concerning for developing osteomyelitis with paravertebral abscess/phlegmon. Chronic fusion across the T11-12 vertebral bodies. Background of more diffuse mild degenerative changes. No other acute or  worrisome osseous features. No suspicious lytic or blastic lesions seen elsewhere. Review of the MIP images confirms the above findings. IMPRESSION: 1. No evidence of central pulmonary artery filling defects to suggest acute pulmonary embolism. 2. Progressive sclerotic and destructive features along the anterior T7-T8 disc space with some adjacent paravertebral soft tissue thickening highly concerning for developing osteomyelitis with paravertebral abscess/phlegmon. Recommend further evaluation with MRI. 3. Extensive reflux of contrast throughout the right upper extremity possibly related to some degree of outlet obstruction or chronic occlusions of the stented right axillary and left subclavian veins. Left subclavian dialysis catheter tip terminates at the right atrium. 4. Cardiomegaly with four-chamber enlargement. Significant redistribution of the pulmonary vascularity with diffuse interlobular septal thickening and fissural thickening with bilateral pleural effusions, increasing from the comparison exam in now tracking within the fissures. Findings are favored to represent CHF with pulmonary edema. 5. Enlarged central pulmonary arteries may reflect some chronic pulmonary artery hypertension given similar to prior. 6. Scattered enlarged mediastinal and hilar lymph nodes, overall burden quite similar to the comparison study. May reflect a reactive etiology. 7. Aortic Atherosclerosis (ICD10-I70.0). Electronically Signed: By: Lovena Le M.D. On: 08/10/2020 22:23   MR THORACIC SPINE WO CONTRAST  Result Date: 08/11/2020 CLINICAL DATA:  Initial evaluation for possible osteomyelitis. EXAM: MRI THORACIC SPINE WITHOUT CONTRAST TECHNIQUE: Multiplanar, multisequence MR imaging of the thoracic spine was performed. No intravenous contrast was administered. COMPARISON:  Prior CT from 10/11/2019 FINDINGS: Alignment: Physiologic with preservation of the normal thoracic kyphosis. No listhesis. Vertebrae: Vertebral body  height maintained without  acute or chronic fracture. Chronic endplate Schmorl's node deformity noted at the superior endplate of L2. T11 and T12 vertebral bodies are partially ankylosed anteriorly. Intervertebral disc space narrowing with increased fluid signal intensity seen within the T7-8 interspace anteriorly. Mild associated endplate irregularity within the adjacent T7 and T8 endplates. Abnormal edema within the T7 and T8 vertebral bodies. Possible minimal edema within the adjacent paraspinous soft tissues. While these findings are somewhat nonspecific, appearance is most concerning for possible osteomyelitis discitis. No discrete paraspinous collection. No epidural abscess or other collection identified. No other evidence for acute infection within the thoracic spine. Cord:  Normal signal and morphology.  No epidural collections. Paraspinal and other soft tissues: Possible minimal paraspinous edema adjacent to the T7-8 interspace. Paraspinous soft tissues demonstrate no other acute finding. Irregular bilateral pleural effusions noted, better evaluated on prior CT. Disc levels: Normal expected multilevel disc desiccation seen throughout the thoracic spine for age. Chronic endplate Schmorl's node deformity without edema at the superior endplate of L2. No significant disc bulge or focal disc herniation. No significant spinal stenosis. Foramina remain patent. IMPRESSION: 1. Findings concerning for possible osteomyelitis discitis at the T7-8 interspace. Correlation with symptomatology and laboratory values recommended. No epidural abscess or other collection identified. 2. Irregular bilateral pleural effusions, better evaluated on prior CT. Electronically Signed   By: Jeannine Boga M.D.   On: 08/11/2020 03:33   US Venous Img Upper Uni Right(DVT)  Result Date: 08/11/2020 CLINICAL DATA:  Dialysis patient, previous upper extremity access sites, venous stenting EXAM: RIGHT UPPER EXTREMITY VENOUS DOPPLER  ULTRASOUND TECHNIQUE: Gray-scale sonography with graded compression, as well as color Doppler and duplex ultrasound were performed to evaluate the upper extremity deep venous system from the level of the subclavian vein and including the jugular, axillary, basilic, radial, ulnar and upper cephalic vein. Spectral Doppler was utilized to evaluate flow at rest and with distal augmentation maneuvers. COMPARISON:  None. FINDINGS: Contralateral Subclavian Vein: Respiratory phasicity is normal and symmetric with the symptomatic side. No evidence of thrombus. Normal compressibility. Internal Jugular Vein: No evidence of thrombus. Normal compressibility, respiratory phasicity and response to augmentation. Subclavian Vein: No evidence of thrombus. Normal compressibility, respiratory phasicity and response to augmentation. Axillary Vein: No evidence of thrombus. Normal compressibility, respiratory phasicity and response to augmentation. Cephalic Vein: Not visualized related to prior dialysis access. Suspect occluded. Basilic Vein: No evidence of thrombus. Normal compressibility, respiratory phasicity and response to augmentation. Brachial Veins: No evidence of thrombus. Normal compressibility, respiratory phasicity and response to augmentation. Radial Veins: No evidence of thrombus. Normal compressibility, respiratory phasicity and response to augmentation. Ulnar Veins: No evidence of thrombus. Normal compressibility, respiratory phasicity and response to augmentation. Other Findings: Old right upper extremity dialysis access is occluded. IMPRESSION: No significant acute right upper extremity occlusive DVT. Electronically Signed   By: Jerilynn Mages.  Shick M.D.   On: 08/11/2020 10:51   DG Chest Portable 1 View  Result Date: 08/10/2020 CLINICAL DATA:  Chest pain shortness of breath, history of asthma. EXAM: PORTABLE CHEST 1 VIEW COMPARISON:  CT the abdomen and pelvis from September of 2021 and CT of the chest also from September of  2021 FINDINGS: Signs of LEFT axillary and subclavian venous stenting similar to the prior exam. Stenting also in the RIGHT mid arm. Interval removal of RIGHT-sided central venous access device and placement of a LEFT-sided dialysis catheter, tip in the mid to low RIGHT atrium relative to the longest lumen. Trachea midline, air column normal. Cardiomediastinal contours with enlargement.  Hilar structures with engorgement of central pulmonary vasculature with increased interstitial markings bilaterally. Blunting of LEFT costodiaphragmatic sulcus in patchy basilar airspace opacities. On limited assessment no acute skeletal process. IMPRESSION: 1. Findings consistent with volume overload/CHF. Difficult to exclude the possibility of concomitant infection particularly in the LEFT lung base. 2. New LEFT-sided dialysis catheter, tip in the mid to low RIGHT atrium. Electronically Signed   By: Zetta Bills M.D.   On: 08/10/2020 10:17    Labs:  CBC: Recent Labs    06/25/20 0641 08/10/20 1120 08/11/20 0110 08/12/20 0425  WBC 5.1 4.3 3.5* 4.0  HGB 7.9* 7.4* 7.1* 6.9*  HCT 24.3* 22.5* 22.1* 22.1*  PLT 218 146* 107* 115*    COAGS: Recent Labs    11/24/19 1437 11/24/19 1437 11/25/19 0830 03/29/20 0615 04/13/20 0703 06/13/20 0532  INR 1.2   < > 1.3* 1.1 1.0 1.3*  APTT 46*  --   --  40* 35 54*   < > = values in this interval not displayed.    BMP: Recent Labs    06/20/20 0634 06/20/20 0634 06/21/20 0644 06/21/20 0644 06/22/20 0547 06/22/20 0547 06/25/20 0641 08/10/20 1120 08/11/20 0110 08/12/20 0425  NA 135   < > 138   < > 135   < > 132* 139 137 135  K 4.1   < > 4.0   < > 4.0   < > 3.9 2.9* 3.3* 3.9  CL 99   < > 100   < > 100   < > 96* 99 99 95*  CO2 25   < > 27   < > 25   < > 24 28 29 29   GLUCOSE 83   < > 86   < > 60*   < > 76 46* 64* 101*  BUN 22   < > 12   < > 15   < > 14 8 6* 10  CALCIUM 8.1*   < > 8.2*   < > 8.4*   < > 8.5* 7.7* 7.5* 8.2*  CREATININE 4.99*   < > 3.49*   < >  4.44*   < > 4.82* 3.74* 3.06* 4.28*  GFRNONAA 9*   < > 13*   < > 10*   < > 9* 13* 17* 11*  GFRAA 10*  --  16*  --  12*  --  11*  --   --   --    < > = values in this interval not displayed.    LIVER FUNCTION TESTS: Recent Labs    05/22/20 0840 05/22/20 0840 05/23/20 0506 06/10/20 2119 06/18/20 1726 06/19/20 0707 08/10/20 1120 08/12/20 0425  BILITOT 1.3*  --  0.9  --   --   --  0.5 0.5  AST 24  --  16  --   --   --  17 15  ALT 10  --  10  --   --   --  10 9  ALKPHOS 78  --  66  --   --   --  68 70  PROT 7.6  --  6.5  --   --   --  6.1* 6.0*  ALBUMIN 4.0   < > 3.3*   < > 2.3* 2.3* 2.4* 2.3*   < > = values in this interval not displayed.     Assessment and Plan:  59 y.o. female inpatient. History of  ESRD on HD, CAD, CHF, HTN, RCC. Presented to the ED  at [facility] with  persistent and worsening SHOB.  The Patient was found to be in acute  hypoxic respiratory failure. CT angio performed on 11.19.21. CT also showed T7-T8 disc space concerning for osteomyelitis. MR thoracic from 11.20.21 Findings concerning for possible osteomyelitis discitis at the T7-8 interspace. Correlation with symptomatology and laboratory values recommended. No epidural abscess or other collection identified.  Team is requesting a biopsy for culture. WBC is WBL, patient is afebrile, Hgb 6.9, Cr 4.28, COVID -, Blood cultures negative X 2 days. Patient is allergic to tape. NPO since midnight.   Risks and benefits of Thoracic disc aspiration / biopsy was discussed with the patient and/or patient's family including, but not limited to bleeding, infection, damage to adjacent structures or low yield requiring additional tests.  All of the questions were answered and there is agreement to proceed.  Consent signed and given to IR RN.  Thank you for this interesting consult.  I greatly enjoyed meeting GWYNNETH FABIO and look forward to participating in their care.  A copy of this report was sent to the requesting provider on  this date.  Electronically Signed: Jacqualine Mau, NP 08/13/2020, 9:42 AM   I spent a total of 40 Minutes    in face to face in clinical consultation, greater than 50% of which was counseling/coordinating care for Thoracic disc aspiration - biopsy

## 2020-08-13 NOTE — Progress Notes (Signed)
Patient ID: [name], female   DOB: 10/17/1960, 59 y.o.   MRN: [id number] [facility] PROGRESS NOTE  [name] ZHY:[id number] DOB: 17-Jan-1961 DOA: 08/10/2020 PCP: [facility] [place]  HPI/Subjective: Patient seen this morning.  Does not complain of any mid back pain.  No fever or chills.  Admitted initially with acute hypoxic respiratory failure.  Objective: Vitals:   08/13/20 1430 08/13/20 1445  BP: 138/61 139/62  Pulse: 62 60  Resp: 12 (!) 8  Temp:    SpO2: 100% 100%    Intake/Output Summary (Last 24 hours) at 08/13/2020 1448 Last data filed at 08/13/2020 1232 Gross per 24 hour  Intake 565 ml  Output 0 ml  Net 565 ml   Filed Weights   08/12/20 0428 08/13/20 0405 08/13/20 1252  Weight: 79.7 kg 82.3 kg 82.3 kg    ROS: Review of Systems  Respiratory: Negative for cough and shortness of breath.   Cardiovascular: Negative for chest pain.  Gastrointestinal: Negative for abdominal pain, nausea and vomiting.   Exam: Physical Exam HENT:     Head: Normocephalic.     Mouth/Throat:     Pharynx: No oropharyngeal exudate.  Eyes:     General: Lids are normal.     Conjunctiva/sclera: Conjunctivae normal.  Cardiovascular:     Rate and Rhythm: Normal rate and regular rhythm.     Heart sounds: Normal heart sounds, S1 normal and S2 normal.  Pulmonary:     Breath sounds: No decreased breath sounds, wheezing, rhonchi or rales.  Abdominal:     Palpations: Abdomen is soft.     Tenderness: There is no abdominal tenderness.  Musculoskeletal:     Right lower leg: No swelling.     Left lower leg: No swelling.  Skin:    General: Skin is warm.     Findings: No rash.  Neurological:     Mental Status: She is alert and oriented to person, place, and time.       Data Reviewed: Basic Metabolic Panel: Recent Labs  Lab 08/10/20 1120 08/10/20 1330 08/11/20 0110 08/12/20 0425  NA 139  --  137 135  K 2.9*  --  3.3* 3.9  CL 99  --  99 95*  CO2 28  --  29  29  GLUCOSE 46*  --  64* 101*  BUN 8  --  6* 10  CREATININE 3.74*  --  3.06* 4.28*  CALCIUM 7.7*  --  7.5* 8.2*  MG  --  1.8 1.8  --   PHOS  --   --  2.4*  --    Liver Function Tests: CBC: Recent Labs  Lab 08/10/20 1120 08/11/20 0110 08/12/20 0425  WBC 4.3 3.5* 4.0  NEUTROABS  --   --  1.1*  HGB 7.4* 7.1* 6.9*  HCT 22.5* 22.1* 22.1*  MCV 87.9 89.1 90.2  PLT 146* 107* 115*   BNP (last 3 results) Recent Labs    05/22/20 0840  BNP 620.7*    CBG: Recent Labs  Lab 08/12/20 2045 08/13/20 0010 08/13/20 0509 08/13/20 0810 08/13/20 1150  GLUCAP 108* 121* 161* 127* 90    Recent Results (from the past 240 hour(s))  Resp Panel by RT-PCR (Flu A&B, Covid) Nasopharyngeal Swab     Status: None   Collection Time: 08/10/20 11:20 AM   Specimen: Nasopharyngeal Swab; Nasopharyngeal(NP) swabs in vial transport medium  Result Value Ref Range Status   SARS Coronavirus 2 by RT PCR NEGATIVE NEGATIVE Final  Comment: (NOTE) SARS-CoV-2 target nucleic acids are NOT DETECTED.  The SARS-CoV-2 RNA is generally detectable in upper respiratory specimens during the acute phase of infection. The lowest concentration of SARS-CoV-2 viral copies this assay can detect is 138 copies/mL. A negative result does not preclude SARS-Cov-2 infection and should not be used as the sole basis for treatment or other patient management decisions. A negative result may occur with  improper specimen collection/handling, submission of specimen other than nasopharyngeal swab, presence of viral mutation(s) within the areas targeted by this assay, and inadequate number of viral copies(<138 copies/mL). A negative result must be combined with clinical observations, patient history, and epidemiological information. The expected result is Negative.  Fact Sheet for Patients:  EntrepreneurPulse.com.au  Fact Sheet for Healthcare Providers:  IncredibleEmployment.be  This test is  no t yet approved or cleared by the [place] FDA and  has been authorized for detection and/or diagnosis of SARS-CoV-2 by FDA under an Emergency Use Authorization (EUA). This EUA will remain  in effect (meaning this test can be used) for the duration of the COVID-19 declaration under Section 564(b)(1) of the Act, 21 U.S.C.section 360bbb-3(b)(1), unless the authorization is terminated  or revoked sooner.       Influenza A by PCR NEGATIVE NEGATIVE Final   Influenza B by PCR NEGATIVE NEGATIVE Final    Comment: (NOTE) The Xpert Xpress SARS-CoV-2/FLU/RSV plus assay is intended as an aid in the diagnosis of influenza from Nasopharyngeal swab specimens and should not be used as a sole basis for treatment. Nasal washings and aspirates are unacceptable for Xpert Xpress SARS-CoV-2/FLU/RSV testing.  Fact Sheet for Patients: EntrepreneurPulse.com.au  Fact Sheet for Healthcare Providers: IncredibleEmployment.be  This test is not yet approved or cleared by the [place] FDA and has been authorized for detection and/or diagnosis of SARS-CoV-2 by FDA under an Emergency Use Authorization (EUA). This EUA will remain in effect (meaning this test can be used) for the duration of the COVID-19 declaration under Section 564(b)(1) of the Act, 21 U.S.C. section 360bbb-3(b)(1), unless the authorization is terminated or revoked.  Performed at [facility], [address] [place], [place] [address]   MRSA PCR Screening     Status: None   Collection Time: 08/10/20 10:12 PM   Specimen: Nasal Mucosa; Nasopharyngeal  Result Value Ref Range Status   MRSA by PCR NEGATIVE NEGATIVE Final    Comment:        The GeneXpert MRSA Assay (FDA approved for NASAL specimens only), is one component of a comprehensive MRSA colonization surveillance program. It is not intended to diagnose MRSA infection nor to guide or monitor treatment for MRSA  infections. Performed at [facility], [address] [place], [place] [address]   Culture, blood (Routine X 2) w Reflex to ID Panel     Status: None (Preliminary result)   Collection Time: 08/11/20 12:51 AM   Specimen: BLOOD  Result Value Ref Range Status   Specimen Description BLOOD BLOOD LEFT HAND  Final   Special Requests   Final    BOTTLES DRAWN AEROBIC ONLY Blood Culture results may not be optimal due to an inadequate volume of blood received in culture bottles   Culture   Final    NO GROWTH 2 DAYS Performed at [facility], [address], [place], [place] [address]    Report Status PENDING  Incomplete  Culture, blood (Routine X 2) w Reflex to ID Panel     Status: None (Preliminary result)  Collection Time: 08/11/20  1:03 AM   Specimen: BLOOD  Result Value Ref Range Status   Specimen Description BLOOD BLOOD LEFT HAND  Final   Special Requests   Final    BOTTLES DRAWN AEROBIC AND ANAEROBIC Blood Culture results may not be optimal due to an inadequate volume of blood received in culture bottles   Culture   Final    NO GROWTH 2 DAYS Performed at [facility], [address], [place], [place] [address]    Report Status PENDING  Incomplete     Studies: IR INJECT DIAG/THERA/INC NEEDLE/CATH/PLC EPI/CERV/THOR W/IMG  Result Date: 08/13/2020 INDICATION: Concern for discitis/osteomyelitis involving T7-T8. Please perform fluoroscopic guided aspiration for diagnostic purposes. EXAM: IR INJECT/THERA/INC NEEDLE/CATH/PLC EPI/CERV/THOR W/IMG COMPARISON:  Thoracic spine MRI-08/11/2020; chest CT-08/10/2020 MEDICATIONS: None ANESTHESIA/SEDATION: Moderate (conscious) sedation was employed during this procedure. A total of Fentanyl 50 mcg Versed 1 mg was administered intravenously. Moderate Sedation Time: 20 minutes. The patient's level of consciousness and vital signs were monitored continuously by radiology nursing throughout the procedure under my direct  supervision. CONTRAST:  None FLUOROSCOPY TIME:  1 minute, 48 seconds (37.1 mGy) COMPLICATIONS: None immediate. PROCEDURE: Informed written consent was obtained from the patient after a discussion of the risks, benefits and alternatives to treatment. A timeout was performed prior to the initiation of the procedure. The patient was positioned prone on the fluoroscopy table. The T7-T8 intervertebral disc space was marked fluoroscopically. Utilizing an oblique, left-sided approach, a 17 gauge trocar was advanced into the targeted intervertebral disc. Needle positioning within the left posterolateral aspect of the T7-T8 intervertebral disc was confirmed with the fluoroscopic imaging in both the anterior and lateral projections. Multiple spot fluoroscopic images were saved for procedural documentation purposes. With needle positioning confirmed attempts were made to perform aspiration, however no fluid was able to be aspirated. As such approximately 2 cc saline was administered and subsequently aspirated yielding approximately 1 cc blood tinged fluid. All aspirated fluid was capped and sent to the laboratory analysis. The needle was removed and superficial hemostasis was achieved with manual compression. A dressing was applied. The patient tolerated the procedure well without immediate postprocedural complication. IMPRESSION: Technically successful fluoroscopic guided T7-T8 disc lavage aspiration. Electronically Signed   By: Sandi Mariscal M.D.   On: 08/13/2020 14:40    Scheduled Meds: . sodium chloride   Intravenous Once  . acetaminophen  650 mg Oral Once  . amLODipine  10 mg Oral Daily  . ascorbic acid  500 mg Oral Q breakfast  . atorvastatin  20 mg Oral QPM  . carvedilol  3.125 mg Oral BID WC  . Chlorhexidine Gluconate Cloth  6 each Topical Q0600  . cholecalciferol  1,000 Units Oral Daily  . cholestyramine light  4 g Oral BID  . epoetin (EPOGEN/PROCRIT) injection  8,000 Units Intravenous Q M,W,F-HD  .  feeding supplement  237 mL Oral Q24H  . fentaNYL      . ferrous sulfate  325 mg Oral Q breakfast  . furosemide  80 mg Oral Daily  . gabapentin  100 mg Oral QPM  . hydrALAZINE  50 mg Oral Q8H  . hydrOXYzine  25 mg Oral Daily  . lipase/protease/amylase  36,000 Units Oral TID AC  . midazolam      . multivitamin  1 tablet Oral Daily  . omega-3 acid ethyl esters  1 capsule Oral BID  . pantoprazole  40 mg Oral Daily  . saccharomyces boulardii  250 mg Oral Daily  .  sevelamer carbonate  800 mg Oral BID WC  . sodium chloride flush  3 mL Intravenous Q12H  . cyanocobalamin  1,000 mcg Oral Daily   Continuous Infusions: . sodium chloride      Assessment/Plan:  1. T7/T8 discitis versus osteomyelitis.  Interventional radiology was able to get a aspirate of the disc today.  Heparin subcutaneous held and aspirin also held.  Infectious disease consultation for antibiotics. 2. Acute hypoxic respiratory failure with pulse ox of 87% on room air on the 19th.  Hopefully will be able to get off the oxygen will check pulse ox on room air tomorrow morning with ambulation. 3. Acute diastolic congestive heart failure.  Dialysis is the only way to remove fluid since the patient does not urinate.  Lungs were clear this morning. 4. Anemia of chronic disease.  Given 1 unit of packed red blood cells yesterday because of a hemoglobin of 6.9.  Labs will be drawn today with dialysis. 5. Sinus bradycardia.  Coreg dose cut back. 6. Hypoglycemia.  Sugars okay off D10 drip.  Hemoglobin A1c low at 3.9.  Patient is not a diabetic 7. End-stage renal disease.  Dialysis today 8. History of renal cell cancer status post cryoablation 9. Chronic pancreatitis on Creon before meals        Code Status:     Code Status Orders  (From admission, onward)         Start     Ordered   08/10/20 1323  Full code  Continuous        08/10/20 1323        Code Status History    Date Active Date Inactive Code Status Order ID  Comments User Context   06/02/2020 2251 06/25/2020 2316 Full Code [id number]  Elwyn Reach, MD ED   05/22/2020 1201 05/23/2020 2050 Full Code [id number]  Vashti Hey, MD ED   05/02/2020 2057 05/06/2020 1929 Full Code [id number]  Mansy, Arvella Merles, MD ED   04/13/2020 1458 04/14/2020 2239 Full Code [id number]  Judithann Sheen, Janalyn Harder, PA-C Inpatient   11/24/2019 2038 11/30/2019 1824 Full Code [id number]  Acheampong, Warnell Bureau, MD Inpatient   11/11/2019 0715 11/19/2019 0006 Full Code [id number]  Awilda Bill, NP ED   09/30/2019 2126 10/01/2019 2257 Full Code [id number]  Nolberto Hanlon, MD ED   10/04/2018 1847 10/12/2018 0027 Full Code [id number]  Dustin Flock, MD Inpatient   06/18/2018 0449 06/20/2018 1642 Full Code [id number]  Arta Silence, MD ED   02/12/2018 1312 02/16/2018 0050 Full Code [id number]  Epifanio Lesches, MD ED   01/17/2018 1848 01/21/2018 1341 Full Code [id number]  Vaughan Basta, MD Inpatient   09/30/2017 1804 10/11/2017 2238 Full Code [id number]  Vaughan Basta, MD Inpatient   09/03/2017 2243 09/05/2017 1901 Full Code [id number]  Lance Coon, MD Inpatient   05/04/2017 1833 05/05/2017 2330 Full Code [id number]  Henreitta Leber, MD Inpatient   03/23/2017 1551 03/25/2017 1739 Full Code [id number]  Gladstone Lighter, MD Inpatient   04/21/2016 1559 04/21/2016 1717 Full Code [id number]  Nicholes Mango, MD Inpatient   04/08/2016 1415 04/11/2016 1947 Full Code [id number]  Theodoro Grist, MD Inpatient   03/15/2016 1322 03/20/2016 0019 Full Code [id number]  Bettey Costa, MD Inpatient   02/16/2016 1409 02/19/2016 2238 Full Code [id number]  Bettey Costa, MD ED   12/17/2015 1139 12/21/2015 2209 Full Code [id number]  Dustin Flock, MD ED   11/12/2015 1602 12/01/2015 1943 Full Code [id number]  Bettey Costa, MD Inpatient   10/07/2015 [phone] 10/12/2015 1959  Full Code [id number]  Fritzi Mandes, MD ED   10/03/2015 0355 10/05/2015 2230 Full Code [id number]  Lance Coon, MD Inpatient   08/31/2015 1600 09/03/2015 2209 Full Code  [id number]  Demetrios Loll, MD ED   07/26/2015 1528 07/26/2015 2136 Full Code [id number]  Dionisio David, MD Inpatient   07/22/2015 1736 07/26/2015 1528 Full Code [id number]  Vaughan Basta, MD Inpatient   06/05/2015 2039 06/07/2015 2003 Full Code [id number]  Bettey Costa, MD Inpatient   05/21/2015 2142 05/24/2015 1626 Full Code [id number]  Henreitta Leber, MD Inpatient   04/24/2015 0855 04/26/2015 2211 Full Code [id number]  Demetrios Loll, MD Inpatient   04/01/2015 1558 04/05/2015 2007 Full Code [id number]  Epifanio Lesches, MD ED   03/06/2015 0805 03/09/2015 2111 Full Code [id number]  Juluis Mire, MD Inpatient   02/19/2015 1614 02/24/2015 1741 Full Code [id number]  Aldean Jewett, MD Inpatient   Advance Care Planning Activity     Family Communication: Spoke with son on the phone Disposition Plan: Status is: Inpatient  Dispo: The patient is from: Home              Anticipated d/c is to: Home              Anticipated d/c date is: Likely a few days here in the hospital.              Patient currently awaiting infectious disease consultation for possible antibiotics for discitis.  Had aspiration today by interventional radiology.  Consultants:  Infectious disease  Nephrology  Interventional radiology  Procedures:  Disc aspiration  Time spent: 28 minutes  Janelle Culton [employer]

## 2020-08-13 NOTE — Progress Notes (Signed)
Report called to [facility] RN,  HD not yet ready for patient, will return to floor, able to eat/drink.  Arrived to recovery, awake, alert but does not want to be "bothered" Vitals at baseline for patient.  Bandaid intact T7-T8

## 2020-08-13 NOTE — Progress Notes (Addendum)
[facility]  ROUNDING NOTE   Subjective:   [name] is 59 years old female, well known to our practice,presented to the ED  on 08/10/2020, with worsening SOB. She also reported chest pain, which resolved prior to  ED arrival. SpO2 was 83% on arrival, currently 100% on 3L O2 via nasal cannula.Patient has h/o ESRD, on dialysis MWF.  Patient resting in bed in no acute distress.She has c/o headache and back pain, reports receiving pain medicines for that. She is on 2L O2 via nasal cannula, denies worsening SOB or chest pain.  Objective:  Vital signs in last 24 hours:  Temp:  [97.5 F (36.4 C)-98.3 F (36.8 C)] 98.2 F (36.8 C) (11/22 1252) Pulse Rate:  [57-66] 60 (11/22 1445) Resp:  [8-20] 8 (11/22 1445) BP: (96-166)/(51-88) 139/62 (11/22 1445) SpO2:  [90 %-100 %] 100 % (11/22 1445) Weight:  [82.3 kg] 82.3 kg (11/22 1252)  Weight change: 2.558 kg Filed Weights   08/12/20 0428 08/13/20 0405 08/13/20 1252  Weight: 79.7 kg 82.3 kg 82.3 kg    Intake/Output: I/O last 3 completed shifts: In: 688 [P.O.:360; I.V.:3; Blood:325] Out: 0    Intake/Output this shift:  No intake/output data recorded.  Physical Exam: General: Resting in bed, in no acute distress  Head: Moist oral mucosal membranes  Eyes: Sclerae and conjunctivae clear  Lungs:  Lungs clear, Respiration symmetrical,unlabored, on 2L O2  Heart: S1S2 no rubs or gallops  Abdomen:  Soft, nontender  Extremities:  Trace  peripheral edema   Neurologic: speech clear and appropriate,moving all four extremities  Skin: No acute lesions or rashes  Access: Lt IJ catheter    Basic Metabolic Panel: Recent Labs  Lab 08/10/20 1120 08/10/20 1330 08/11/20 0110 08/12/20 0425  NA 139  --  137 135  K 2.9*  --  3.3* 3.9  CL 99  --  99 95*  CO2 28  --  29 29  GLUCOSE 46*  --  64* 101*  BUN 8  --  6* 10  CREATININE 3.74*  --  3.06* 4.28*  CALCIUM 7.7*  --  7.5* 8.2*  MG  --  1.8 1.8  --   PHOS  --   --  2.4*  --      Liver Function Tests: Recent Labs  Lab 08/10/20 1120 08/12/20 0425  AST 17 15  ALT 10 9  ALKPHOS 68 70  BILITOT 0.5 0.5  PROT 6.1* 6.0*  ALBUMIN 2.4* 2.3*   No results for input(s): LIPASE, AMYLASE in the last 168 hours. No results for input(s): AMMONIA in the last 168 hours.  CBC: Recent Labs  Lab 08/10/20 1120 08/11/20 0110 08/12/20 0425  WBC 4.3 3.5* 4.0  NEUTROABS  --   --  1.1*  HGB 7.4* 7.1* 6.9*  HCT 22.5* 22.1* 22.1*  MCV 87.9 89.1 90.2  PLT 146* 107* 115*    Cardiac Enzymes: No results for input(s): CKTOTAL, CKMB, CKMBINDEX, TROPONINI in the last 168 hours.  BNP: Invalid input(s): POCBNP  CBG: Recent Labs  Lab 08/12/20 2045 08/13/20 0010 08/13/20 0509 08/13/20 0810 08/13/20 1150  GLUCAP 108* 121* 161* 127* 90    Microbiology: Results for orders placed or performed during the hospital encounter of 08/10/20  Resp Panel by RT-PCR (Flu A&B, Covid) Nasopharyngeal Swab     Status: None   Collection Time: 08/10/20 11:20 AM   Specimen: Nasopharyngeal Swab; Nasopharyngeal(NP) swabs in vial transport medium  Result Value Ref Range Status   SARS Coronavirus  2 by RT PCR NEGATIVE NEGATIVE Final    Comment: (NOTE) SARS-CoV-2 target nucleic acids are NOT DETECTED.  The SARS-CoV-2 RNA is generally detectable in upper respiratory specimens during the acute phase of infection. The lowest concentration of SARS-CoV-2 viral copies this assay can detect is 138 copies/mL. A negative result does not preclude SARS-Cov-2 infection and should not be used as the sole basis for treatment or other patient management decisions. A negative result may occur with  improper specimen collection/handling, submission of specimen other than nasopharyngeal swab, presence of viral mutation(s) within the areas targeted by this assay, and inadequate number of viral copies(<138 copies/mL). A negative result must be combined with clinical observations, patient history, and  epidemiological information. The expected result is Negative.  Fact Sheet for Patients:  EntrepreneurPulse.com.au  Fact Sheet for Healthcare Providers:  IncredibleEmployment.be  This test is no t yet approved or cleared by the [place] FDA and  has been authorized for detection and/or diagnosis of SARS-CoV-2 by FDA under an Emergency Use Authorization (EUA). This EUA will remain  in effect (meaning this test can be used) for the duration of the COVID-19 declaration under Section 564(b)(1) of the Act, 21 U.S.C.section 360bbb-3(b)(1), unless the authorization is terminated  or revoked sooner.       Influenza A by PCR NEGATIVE NEGATIVE Final   Influenza B by PCR NEGATIVE NEGATIVE Final    Comment: (NOTE) The Xpert Xpress SARS-CoV-2/FLU/RSV plus assay is intended as an aid in the diagnosis of influenza from Nasopharyngeal swab specimens and should not be used as a sole basis for treatment. Nasal washings and aspirates are unacceptable for Xpert Xpress SARS-CoV-2/FLU/RSV testing.  Fact Sheet for Patients: EntrepreneurPulse.com.au  Fact Sheet for Healthcare Providers: IncredibleEmployment.be  This test is not yet approved or cleared by the [place] FDA and has been authorized for detection and/or diagnosis of SARS-CoV-2 by FDA under an Emergency Use Authorization (EUA). This EUA will remain in effect (meaning this test can be used) for the duration of the COVID-19 declaration under Section 564(b)(1) of the Act, 21 U.S.C. section 360bbb-3(b)(1), unless the authorization is terminated or revoked.  Performed at [facility], [address] [place], [place] [address]   MRSA PCR Screening     Status: None   Collection Time: 08/10/20 10:12 PM   Specimen: Nasal Mucosa; Nasopharyngeal  Result Value Ref Range Status   MRSA by PCR NEGATIVE NEGATIVE Final    Comment:        The GeneXpert MRSA  Assay (FDA approved for NASAL specimens only), is one component of a comprehensive MRSA colonization surveillance program. It is not intended to diagnose MRSA infection nor to guide or monitor treatment for MRSA infections. Performed at [facility], [address] [place], [place] [address]   Culture, blood (Routine X 2) w Reflex to ID Panel     Status: None (Preliminary result)   Collection Time: 08/11/20 12:51 AM   Specimen: BLOOD  Result Value Ref Range Status   Specimen Description BLOOD BLOOD LEFT HAND  Final   Special Requests   Final    BOTTLES DRAWN AEROBIC ONLY Blood Culture results may not be optimal due to an inadequate volume of blood received in culture bottles   Culture   Final    NO GROWTH 2 DAYS Performed at [facility], [address] [place], [place] [address]    Report Status PENDING  Incomplete  Culture, blood (Routine X 2) w Reflex to ID  Panel     Status: None (Preliminary result)   Collection Time: 08/11/20  1:03 AM   Specimen: BLOOD  Result Value Ref Range Status   Specimen Description BLOOD BLOOD LEFT HAND  Final   Special Requests   Final    BOTTLES DRAWN AEROBIC AND ANAEROBIC Blood Culture results may not be optimal due to an inadequate volume of blood received in culture bottles   Culture   Final    NO GROWTH 2 DAYS Performed at [facility], [address] [place], [place] [address]    Report Status PENDING  Incomplete    Coagulation Studies: No results for input(s): LABPROT, INR in the last 72 hours.  Urinalysis: No results for input(s): COLORURINE, LABSPEC, PHURINE, GLUCOSEU, HGBUR, BILIRUBINUR, KETONESUR, PROTEINUR, UROBILINOGEN, NITRITE, LEUKOCYTESUR in the last 72 hours.  Invalid input(s): APPERANCEUR    Imaging: IR INJECT DIAG/THERA/INC NEEDLE/CATH/PLC EPI/CERV/THOR W/IMG  Result Date: 08/13/2020 INDICATION: Concern for discitis/osteomyelitis involving T7-T8. Please perform fluoroscopic guided  aspiration for diagnostic purposes. EXAM: IR INJECT/THERA/INC NEEDLE/CATH/PLC EPI/CERV/THOR W/IMG COMPARISON:  Thoracic spine MRI-08/11/2020; chest CT-08/10/2020 MEDICATIONS: None ANESTHESIA/SEDATION: Moderate (conscious) sedation was employed during this procedure. A total of Fentanyl 50 mcg Versed 1 mg was administered intravenously. Moderate Sedation Time: 20 minutes. The patient's level of consciousness and vital signs were monitored continuously by radiology nursing throughout the procedure under my direct supervision. CONTRAST:  None FLUOROSCOPY TIME:  1 minute, 48 seconds (67.6 mGy) COMPLICATIONS: None immediate. PROCEDURE: Informed written consent was obtained from the patient after a discussion of the risks, benefits and alternatives to treatment. A timeout was performed prior to the initiation of the procedure. The patient was positioned prone on the fluoroscopy table. The T7-T8 intervertebral disc space was marked fluoroscopically. Utilizing an oblique, left-sided approach, a 17 gauge trocar was advanced into the targeted intervertebral disc. Needle positioning within the left posterolateral aspect of the T7-T8 intervertebral disc was confirmed with the fluoroscopic imaging in both the anterior and lateral projections. Multiple spot fluoroscopic images were saved for procedural documentation purposes. With needle positioning confirmed attempts were made to perform aspiration, however no fluid was able to be aspirated. As such approximately 2 cc saline was administered and subsequently aspirated yielding approximately 1 cc blood tinged fluid. All aspirated fluid was capped and sent to the laboratory analysis. The needle was removed and superficial hemostasis was achieved with manual compression. A dressing was applied. The patient tolerated the procedure well without immediate postprocedural complication. IMPRESSION: Technically successful fluoroscopic guided T7-T8 disc lavage aspiration. Electronically  Signed   By: Sandi Mariscal M.D.   On: 08/13/2020 14:40     Medications:   . sodium chloride     . sodium chloride   Intravenous Once  . acetaminophen  650 mg Oral Once  . amLODipine  10 mg Oral Daily  . ascorbic acid  500 mg Oral Q breakfast  . atorvastatin  20 mg Oral QPM  . carvedilol  3.125 mg Oral BID WC  . Chlorhexidine Gluconate Cloth  6 each Topical Q0600  . cholecalciferol  1,000 Units Oral Daily  . cholestyramine light  4 g Oral BID  . epoetin (EPOGEN/PROCRIT) injection  8,000 Units Intravenous Q M,W,F-HD  . feeding supplement  237 mL Oral Q24H  . fentaNYL      . ferrous sulfate  325 mg Oral Q breakfast  . furosemide  80 mg Oral Daily  . gabapentin  100 mg Oral QPM  . hydrALAZINE  50 mg Oral Q8H  .  hydrOXYzine  25 mg Oral Daily  . lipase/protease/amylase  36,000 Units Oral TID AC  . midazolam      . multivitamin  1 tablet Oral Daily  . omega-3 acid ethyl esters  1 capsule Oral BID  . pantoprazole  40 mg Oral Daily  . saccharomyces boulardii  250 mg Oral Daily  . sevelamer carbonate  800 mg Oral BID WC  . sodium chloride flush  3 mL Intravenous Q12H  . cyanocobalamin  1,000 mcg Oral Daily   sodium chloride, acetaminophen, albuterol, alum & mag hydroxide-simeth, ammonium lactate, calcium carbonate, dextromethorphan-guaiFENesin, dextrose, diphenhydrAMINE, hydrALAZINE, HYDROcodone-acetaminophen, morphine injection, mupirocin ointment, nitroGLYCERIN, promethazine, sodium chloride flush  Assessment/ Plan:  [name] is a 58 y.o.  female well known to our practice,presented to the ED, with worsening SOB. She also reported chest pain, which resolved prior to  ED arrival. SpO2 was 83% on arrival, currently 100% on 3L O2 via nasal cannula. Patient has h/o ESRD, on dialysis MWF. We will arrange dialysis for her today.Orders placed.  #ESRD on dialysis MWF Dialysis treatment today Will continue MWF schedule  # Hypertension Blood pressure readings at  goal Continue  current antihypertensive regimen  #Secondary Hyperparathyroidism Lab Results  Component Value Date   PTH 357 (H) 10/05/2018   CALCIUM 8.2 (L) 08/12/2020   CAION 1.09 (L) 04/13/2020   PHOS 2.4 (L) 08/11/2020  Will continue monitoring bone mineral metabolism parameters  # Acute on chronic diastolic CHF Last 2D Echo on 06/11/2020  with EF 50 to 55% Patient continues to be on 2-3L of O2, no worsening SOB  #Anemia of Chronic Kidney Disease Hemoglobin 6.9 on 08/12/2020 Received 1 unit PRBC transfusion On Epogen 8000 units with dialysis   LOS: 2 Amareon Phung 11/22/20213:00 PM

## 2020-08-13 NOTE — Consult Note (Signed)
Consultation Note Date: 08/13/2020   Patient Name: [name]  DOB: [birth year]  MRN: [id number]  Age / Sex: 59 y.o., female  PCP: [facility] [place] Referring Physician: Loletha Grayer, MD  Reason for Consultation: Establishing goals of care  HPI/Patient Profile: 59 y.o. female  with past medical history of renal cell carcinoma status post cryoablation, diabetes, end-stage renal disease on HD, CHF with ejection fraction 55 to 60% admitted on 08/10/2020 with chest pain and shortness of breath.  Chest x-ray showed pulmonary edema, she is breathing treated for this by dialysis.  There was additional incidental finding of mass on her thoracic spine concerning for osteomyelitis versus metastatic disease.  She is scheduled for a biopsy this afternoon.  Palliative medicine consulted for goals of care  Clinical Assessment and Goals of Care:  I have reviewed medical records including EPIC notes, labs and imaging, examined the patient and met at bedside with the patient to discuss diagnosis prognosis, GOC, EOL wishes, disposition and options.  I introduced Palliative Medicine as specialized medical care for people living with serious illness. It focuses on providing relief from the symptoms and stress of a serious illness.   As far as functional and nutritional status-prior to admission she was living at home by herself independently.  She bathe herself, and prepares her own food.  She does require a walker and a cane at home.  She drives herself to dialysis. She has a son who checks on her frequently.  She enjoys caring for her plants and flowers.  She finds great joy in working outside in her flower beds.  We discussed her current illness and what it means in the larger context of her on-going co-morbidities.  Natural disease trajectory and expectations at EOL were discussed.  She shares that if she  were to be on life support she would not want to be kept on it for an extended amount of time.  She does not have advanced directives, but she is very interested in completing these.   I attempted to elicit values and goals of care important to the patient.  She shared concerns of putting too much burden on her son who has his own family.  She shares frustrations that her medical providers are not asking her before calling her son, and her sharing information with her son before she is ready for him to have this information.  Cheney discusses that she has gone through many health issues this year and last year, she has been on life support, and has received CPR.  She wishes to remain full code.  After those instances she went to rehab for short amounts of time and was able to return to her independent state afterwards.  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   Questions and concerns were addressed.  The family was encouraged to call with questions or concerns.   Primary Decision Maker PATIENT  SUMMARY OF RECOMMENDATIONS -Full code, full scope -Chaplain consult for assistance with creating advanced directives -She did not want me to call her son-and requested that other providers to speak to her before calling her son  Code Status/Advance Care Planning:  Full code  Prognosis:    Unable to determine  Discharge Planning: Home with Home Health  Primary Diagnoses: Present on Admission: . Fluid overload . Hypoglycemia . Acute respiratory failure with hypoxia ([place]) . Acute pulmonary edema (HCC) . Acute on chronic diastolic CHF (congestive heart failure) ([place]) . Hypokalemia . TIA (transient ischemic attack) . Benign essential HTN . HLD (hyperlipidemia) . Chest pain . CAD (coronary artery disease) . Anemia in ESRD (end-stage renal disease) ([place]) . GERD (gastroesophageal reflux disease) . Osteomyelitis  ([place])   I have reviewed the medical record, interviewed the patient and family, and examined the patient. The following aspects are pertinent.  Past Medical History:  Diagnosis Date  . Anemia   . Anginal pain ([place])   . Anxiety   . Arthritis   . Asthma   . Broken wrist   . Bronchitis   . chronic diastolic CHF 8/67/6720  . Chronic kidney disease   . COPD (chronic obstructive pulmonary disease) ([place])   . Coronary artery disease    a. cath 2013: stenting to RCA (report not available); b. cath 2014: LM nl, pLAD 40%, mLAD nl, ost LCx 40%, mid LCx nl, pRCA 30% @ site of prior stent, mRCA 50%  . Depression   . Diabetes mellitus ([place])   . Diabetes mellitus without complication ([place])   . Diabetic neuropathy ([place])   . dialysis 2006  . Diverticulosis   . Dizziness   . Dyspnea   . Elevated lipids   . Environmental and seasonal allergies   . ESRD (end stage renal disease) on dialysis ([place])    M-W-F  . Gastroparesis   . GERD (gastroesophageal reflux disease)   . Headache   . History of anemia due to chronic kidney disease   . History of hiatal hernia   . HOH (hard of hearing)   . Hx of pancreatitis 2015  . Hypertension   . Lower extremity edema   . Mitral regurgitation    a. echo 10/2013: EF 62%, noWMA, mildly dilated LA, mild to mod MR/TR, GR1DD  . Myocardial infarction ([place])   . Orthopnea   . Parathyroid abnormality ([place])   . Peripheral arterial disease ([place])   . Pneumonia   . Renal cancer ([place])   . Renal insufficiency    Pt is on dialysis on M,W + F.  . Wheezing    Social History   Socioeconomic History  . Marital status: Divorced    Spouse name: Not on file  . Number of children: Not on file  . Years of education: Not on file  . Highest education level: Not on file  Occupational History  . Not on file  Tobacco Use  . Smoking status: Never Smoker  . Smokeless tobacco: Never Used  Vaping Use  . Vaping Use: Never used  Substance and Sexual Activity  . Alcohol use:  Not Currently    Comment: glass wine week per pt  . Drug use: Yes    Types: Marijuana    Comment: once a day  . Sexual activity: Never  Other Topics Concern  . Not on file  Social History Narrative   ** Merged History Encounter **       Social Determinants of Health  Financial Resource Strain:   . Difficulty of Paying Living Expenses: Not on file  Food Insecurity:   . Worried About Charity fundraiser in the Last Year: Not on file  . Ran Out of Food in the Last Year: Not on file  Transportation Needs:   . Lack of Transportation (Medical): Not on file  . Lack of Transportation (Non-Medical): Not on file  Physical Activity:   . Days of Exercise per Week: Not on file  . Minutes of Exercise per Session: Not on file  Stress:   . Feeling of Stress : Not on file  Social Connections:   . Frequency of Communication with Friends and Family: Not on file  . Frequency of Social Gatherings with Friends and Family: Not on file  . Attends Religious Services: Not on file  . Active Member of Clubs or Organizations: Not on file  . Attends Archivist Meetings: Not on file  . Marital Status: Not on file   Scheduled Meds: . sodium chloride   Intravenous Once  . acetaminophen  650 mg Oral Once  . amLODipine  10 mg Oral Daily  . ascorbic acid  500 mg Oral Q breakfast  . atorvastatin  20 mg Oral QPM  . carvedilol  3.125 mg Oral BID WC  . Chlorhexidine Gluconate Cloth  6 each Topical Q0600  . cholecalciferol  1,000 Units Oral Daily  . cholestyramine light  4 g Oral BID  . epoetin (EPOGEN/PROCRIT) injection  8,000 Units Intravenous Q M,W,F-HD  . feeding supplement  237 mL Oral Q24H  . fentaNYL      . ferrous sulfate  325 mg Oral Q breakfast  . furosemide  80 mg Oral Daily  . gabapentin  100 mg Oral QPM  . hydrALAZINE  50 mg Oral Q8H  . hydrOXYzine  25 mg Oral Daily  . lipase/protease/amylase  36,000 Units Oral TID AC  . midazolam      . multivitamin  1 tablet Oral Daily  .  omega-3 acid ethyl esters  1 capsule Oral BID  . pantoprazole  40 mg Oral Daily  . saccharomyces boulardii  250 mg Oral Daily  . sevelamer carbonate  800 mg Oral BID WC  . sodium chloride flush  3 mL Intravenous Q12H  . cyanocobalamin  1,000 mcg Oral Daily   Continuous Infusions: . sodium chloride     PRN Meds:.sodium chloride, acetaminophen, albuterol, alum & mag hydroxide-simeth, ammonium lactate, calcium carbonate, dextromethorphan-guaiFENesin, dextrose, diphenhydrAMINE, hydrALAZINE, HYDROcodone-acetaminophen, morphine injection, mupirocin ointment, nitroGLYCERIN, promethazine, sodium chloride flush Medications Prior to Admission:  Prior to Admission medications   Medication Sig Start Date End Date Taking? Authorizing Provider  albuterol (PROVENTIL) (2.5 MG/3ML) 0.083% nebulizer solution Take 2.5 mg by nebulization every 4 (four) hours as needed for wheezing or shortness of breath.   Yes [provider]  albuterol (VENTOLIN HFA) 108 (90 Base) MCG/ACT inhaler Inhale 2 puffs into the lungs every 4 (four) hours as needed for wheezing or shortness of breath. 11/09/19  Yes [provider]  alum & mag hydroxide-simeth (MAALOX/MYLANTA) 200-200-20 MG/5ML suspension Take 15 mLs by mouth every 6 (six) hours as needed for indigestion or heartburn. 06/25/20  Yes Enzo Bi, MD  amLODipine (NORVASC) 10 MG tablet Take 1 tablet (10 mg total) by mouth daily. 06/25/20  Yes Enzo Bi, MD  ammonium lactate (LAC-HYDRIN) 12 % lotion Apply 1 application topically 2 (two) times daily as needed for dry skin.    Yes [provider]  Ascorbic Acid (VITA-C PO) Take 1 tablet by mouth daily.   Yes [provider]  aspirin EC 81 MG tablet Take 81 mg by mouth daily.   Yes [provider]  atorvastatin (LIPITOR) 20 MG tablet Take 20 mg by mouth every evening.  10/24/19  Yes [provider]  calcium carbonate (TUMS - DOSED IN MG ELEMENTAL CALCIUM) 500 MG chewable tablet Chew  2.5 tablets (500 mg of elemental calcium total) by mouth every 6 (six) hours as needed for indigestion. 06/25/20  Yes Enzo Bi, MD  carvedilol (COREG) 6.25 MG tablet Take 6.25 mg by mouth 2 (two) times daily with a meal.   Yes [provider]  Cholecalciferol (VITAMIN D3) 25 MCG (1000 UT) CAPS Take 1,000 Units by mouth daily.    Yes [provider]  cholestyramine light (PREVALITE) 4 g packet Take 4 g by mouth 2 (two) times daily.   Yes [provider]  CREON 12000 units CPEP capsule Take 36,000 Units by mouth 3 (three) times daily before meals.  10/21/19  Yes [provider]  cyanocobalamin 1000 MCG tablet Take 1,000 mcg by mouth daily.    Yes [provider]  diphenhydrAMINE (BENADRYL) 25 MG tablet Take 25 mg by mouth every 6 (six) hours as needed for itching.   Yes [provider]  epoetin alfa (EPOGEN) 10000 UNIT/ML injection Inject 1 mL (10,000 Units total) into the vein every Monday, Wednesday, and Friday with hemodialysis. 06/27/20  Yes Enzo Bi, MD  feeding supplement, ENSURE ENLIVE, (ENSURE ENLIVE) LIQD Take 237 mLs by mouth daily. 06/26/20  Yes Enzo Bi, MD  Ferrous Sulfate (IRON) 325 (65 Fe) MG TABS Take 325 mg by mouth daily.    Yes [provider]  furosemide (LASIX) 80 MG tablet Take 80 mg by mouth daily. 05/08/20  Yes [provider]  gabapentin (NEURONTIN) 100 MG capsule Take 100 mg by mouth every evening.    Yes [provider]  hydrALAZINE (APRESOLINE) 50 MG tablet Take 1 tablet (50 mg total) by mouth every 8 (eight) hours. 06/25/20  Yes Enzo Bi, MD  HYDROcodone-acetaminophen (NORCO/VICODIN) 5-325 MG tablet Take 1 tablet by mouth 3 (three) times daily as needed for moderate pain or severe pain. 07/26/20  Yes [provider]  hydrOXYzine (ATARAX/VISTARIL) 25 MG tablet Take 25 mg by mouth daily.    Yes [provider]  multivitamin (RENA-VIT) TABS tablet Take 1 tablet by mouth daily.    Yes  [provider]  mupirocin ointment (BACTROBAN) 2 % Apply 1 application topically daily as needed (leg rash).  11/09/19  Yes [provider]  nitroGLYCERIN (NITROSTAT) 0.4 MG SL tablet Place 0.4 mg under the tongue every 5 (five) minutes as needed for chest pain.    Yes [provider]  Omega-3 300 MG CAPS Take 300 mg by mouth 2 (two) times daily. Every other day opposite omega XL   Yes [provider]  pantoprazole (PROTONIX) 40 MG tablet Take 40 mg by mouth daily. 08/28/19  Yes [provider]  polyethylene glycol (MIRALAX / GLYCOLAX) 17 g packet Take 17 g by mouth daily. 06/25/20  Yes Enzo Bi, MD  Probiotic Product (PROBIOTIC PO) Take 1 capsule by mouth daily.   Yes [provider]  promethazine (PHENERGAN) 25 MG tablet Take 1 tablet (25 mg total) by mouth every 6 (six) hours as needed for nausea or vomiting. 06/25/20  Yes Enzo Bi, MD  sevelamer carbonate (RENVELA) 800 MG tablet Take  800 mg by mouth 4 (four) times daily. (at meals and with a snack)   Yes [provider]   Allergies  Allergen Reactions  . Ace Inhibitors Swelling and Anaphylaxis  . Ativan [Lorazepam] Other (See Comments)    Reaction:Hallucinations and headaches  . Compazine [Prochlorperazine Edisylate] Anaphylaxis, Nausea And Vomiting and Other (See Comments)    Other reaction(s): dystonia from this vs. Reglan, 23 Jul - patient relates that she takes promethazine frequently with no problems  . Sumatriptan Succinate Other (See Comments)    Other reaction(s): delirium and hallucinations per [facility] records  . Zofran [Ondansetron] Nausea And Vomiting    Per pt. she is allergic to zofran or will experience adverse reaction like hallucination   . Losartan Nausea Only  . Prochlorperazine Other (See Comments)    Reaction:  Unknown . Patient does not remember reaction but she does have vertigo and anxiety along with n and v at times. Could be used to treat any of these    . Reglan [Metoclopramide] Other (See Comments)    Per patient her Dr. Evelina Bucy her off it   . Scopolamine Other (See Comments)    Dizziness, also has vertigo already  . Tape Rash    Plastic tape causes rash  . Tapentadol Rash   Review of Systems  Constitutional: Negative for activity change and appetite change.  Respiratory: Negative for shortness of breath.   Cardiovascular: Negative for chest pain.  All other systems reviewed and are negative.   Physical Exam Vitals and nursing note reviewed.  Constitutional:      General: She is not in acute distress.    Appearance: She is not ill-appearing.  Pulmonary:     Effort: Pulmonary effort is normal.  Neurological:     Mental Status: She is alert.  Psychiatric:        Mood and Affect: Mood normal.        Behavior: Behavior normal.     Vital Signs: BP 139/60 (BP Location: Left Arm)   Pulse 64   Temp 98.3 F (36.8 C)   Resp 17   Ht 5' 5"  (1.651 m)   Wt 82.3 kg   SpO2 100%   BMI 30.19 kg/m  Pain Scale: 0-10   Pain Score: 0-No pain   SpO2: SpO2: 100 % O2 Device:SpO2: 100 % O2 Flow Rate: .O2 Flow Rate (L/min): 2 L/min  IO: Intake/output summary:   Intake/Output Summary (Last 24 hours) at 08/13/2020 1519 Last data filed at 08/13/2020 1506 Gross per 24 hour  Intake 805 ml  Output 0 ml  Net 805 ml    LBM: Last BM Date: 08/11/20 Baseline Weight: Weight: 80.3 kg Most recent weight: Weight: 82.3 kg     Palliative Assessment/Data: PPS: 60%     Thank you for this consult. Palliative medicine will continue to follow and assist as needed.   Time In: 0955 Time Out: 1203 Time Total: 128 mins Greater than 50%  of this time was spent counseling and coordinating care related to the above assessment and plan.  Signed by: Mariana Kaufman, AGNP-C Palliative Medicine    Please contact Palliative Medicine Team phone at [phone] for questions and concerns.  For individual provider: See Shea Evans

## 2020-08-13 NOTE — Progress Notes (Signed)
Patient clinically stable post disc Aspiration per Dr Pascal Lux, tolerated well. Denies complaints at this time.bandade dry/intact to T-spine. Vitals stable pre and post procedure. Report given to Humberto Seals post procedure.

## 2020-08-13 NOTE — Progress Notes (Signed)
[name] MRN: [id number] DOB: 10-Jun-1961   Cancelled Treatment:    Reason Eval/Treat Not Completed: Other (comment). Consult received and chart reviewed. Pt currently off unit and unavailable for therapy. Will re-attempt next date.   Lenford Beddow 08/13/2020, 1:35 PM  [name], PT, DPT [phone]

## 2020-08-13 NOTE — Procedures (Signed)
Pre procedural Dx: Concern of discitis-osteomyelitis of T7-T8. Post procedural Dx: Same  Technically successful fluoro guided lavage aspiration of T7-T8 yielding 1 cc of bloody fluid.  EBL: Trace Complications: None immediate  Ronny Bacon, MD Pager #: [phone]

## 2020-08-13 NOTE — Plan of Care (Signed)
Pt resting in bed comfortably  Appears to be in no apparent distress Assessment completed as charted Pt refused morning medications due to being NPO for procedure and requesting to wait until after dialysis.  Pt reports pain with movement but able to ambulate to restroom  Remains on 2L [place]. Dyspnea with exertion.  Educated on plan of care Verbalizes an understanding.  Denies any additional wants or needs at this time Call bell within reach. Will continue to monitor.   Problem: Education: Goal: Knowledge of General Education information will improve Description: Including pain rating scale, medication(s)/side effects and non-pharmacologic comfort measures Outcome: Progressing   Problem: Health Behavior/Discharge Planning: Goal: Ability to manage health-related needs will improve Outcome: Progressing   Problem: Clinical Measurements: Goal: Ability to maintain clinical measurements within normal limits will improve Outcome: Progressing Goal: Will remain free from infection Outcome: Progressing Goal: Diagnostic test results will improve Outcome: Progressing Goal: Respiratory complications will improve Outcome: Progressing Goal: Cardiovascular complication will be avoided Outcome: Progressing   Problem: Activity: Goal: Risk for activity intolerance will decrease Outcome: Progressing   Problem: Nutrition: Goal: Adequate nutrition will be maintained Outcome: Progressing   Problem: Coping: Goal: Level of anxiety will decrease Outcome: Progressing   Problem: Elimination: Goal: Will not experience complications related to bowel motility Outcome: Progressing Goal: Will not experience complications related to urinary retention Outcome: Progressing   Problem: Pain Managment: Goal: General experience of comfort will improve Outcome: Progressing   Problem: Safety: Goal: Ability to remain free from injury will improve Outcome: Progressing   Problem: Skin Integrity: Goal:  Risk for impaired skin integrity will decrease Outcome: Progressing

## 2020-08-14 DIAGNOSIS — Z515 Encounter for palliative care: Secondary | ICD-10-CM

## 2020-08-14 DIAGNOSIS — E8770 Fluid overload, unspecified: Secondary | ICD-10-CM | POA: Diagnosis not present

## 2020-08-14 DIAGNOSIS — I5033 Acute on chronic diastolic (congestive) heart failure: Secondary | ICD-10-CM | POA: Diagnosis not present

## 2020-08-14 DIAGNOSIS — Z7189 Other specified counseling: Secondary | ICD-10-CM | POA: Diagnosis not present

## 2020-08-14 DIAGNOSIS — R112 Nausea with vomiting, unspecified: Secondary | ICD-10-CM

## 2020-08-14 DIAGNOSIS — I501 Left ventricular failure: Secondary | ICD-10-CM

## 2020-08-14 DIAGNOSIS — J9601 Acute respiratory failure with hypoxia: Secondary | ICD-10-CM | POA: Diagnosis not present

## 2020-08-14 DIAGNOSIS — N186 End stage renal disease: Secondary | ICD-10-CM | POA: Diagnosis not present

## 2020-08-14 DIAGNOSIS — M4644 Discitis, unspecified, thoracic region: Secondary | ICD-10-CM | POA: Diagnosis not present

## 2020-08-14 DIAGNOSIS — Z992 Dependence on renal dialysis: Secondary | ICD-10-CM

## 2020-08-14 LAB — TYPE AND SCREEN
ABO/RH(D): O POS
Antibody Screen: NEGATIVE
Unit division: 0

## 2020-08-14 LAB — BPAM RBC
Blood Product Expiration Date: 202112212359
ISSUE DATE / TIME: 202111220105
Unit Type and Rh: 5100

## 2020-08-14 LAB — GLUCOSE, CAPILLARY
Glucose-Capillary: 93 mg/dL (ref 70–99)
Glucose-Capillary: 63 mg/dL — ABNORMAL LOW (ref 70–99)
Glucose-Capillary: 68 mg/dL — ABNORMAL LOW (ref 70–99)
Glucose-Capillary: 94 mg/dL (ref 70–99)
Glucose-Capillary: 84 mg/dL (ref 70–99)
Glucose-Capillary: 62 mg/dL — ABNORMAL LOW (ref 70–99)
Glucose-Capillary: 66 mg/dL — ABNORMAL LOW (ref 70–99)
Glucose-Capillary: 78 mg/dL (ref 70–99)
Glucose-Capillary: 93 mg/dL (ref 70–99)

## 2020-08-14 MED ORDER — PANTOPRAZOLE SODIUM 40 MG IV SOLR
40.0000 mg | Freq: Two times a day (BID) | INTRAVENOUS | Status: DC
  Administered 2020-08-14 – 2020-08-16 (×5): 40 mg via INTRAVENOUS
  Filled 2020-08-14 (×6): qty 40

## 2020-08-14 MED ORDER — HYDROCODONE-ACETAMINOPHEN 5-325 MG PO TABS
1.0000 | ORAL_TABLET | Freq: Four times a day (QID) | ORAL | Status: DC | PRN
  Administered 2020-08-14 – 2020-08-16 (×4): 1 via ORAL
  Filled 2020-08-14 (×4): qty 1

## 2020-08-14 NOTE — Progress Notes (Signed)
[name] MRN: [id number] DOB: 1961/02/25   Cancelled Treatment:    Reason Eval/Treat Not Completed: Other (comment). Pt resting upon arrival. Pt has multiple complaints and reports shes too tired to participate. This therapist stayed in room for 10 minutes trying to encourage her to participate in therapy evaluation. Pt turned head away, closed eyes, and ignored therapist. Will re-attempt tomorrow, if pt willing to participate.   Chananya Canizalez 08/14/2020, 11:56 AM  [name], PT, DPT [phone]

## 2020-08-14 NOTE — Treatment Plan (Signed)
Pt FSBS was 63 this morning, pt states she wanted a coke. 68 at recheck. Pt then given apple juice with 3 sugar packets and sugar came up to 94. Pt asymptomatic. Pt then ambulated to the bathroom with walker. BM noted.

## 2020-08-14 NOTE — Progress Notes (Signed)
  Chaplain On-Call responded to Order Requisition for Advance Directives information.  Attempted two visits; on first: patient was not available; on second: patient was sleeping soundly. Chaplain left AD documents on bedside table.  Will attempt another visit, and will refer to Evening Chaplain.  Cliffdell Athene Schuhmacher M.Div., [facility]

## 2020-08-14 NOTE — Progress Notes (Signed)
Patient ID: [name], female   DOB: July 01, 1961, 59 y.o.   MRN: [id number] [facility] PROGRESS NOTE  HAEDYN BREAU JQB:[id number] DOB: 09/22/1961 DOA: 08/10/2020 PCP: [facility]  HPI/Subjective: Patient stated that she has been having some nausea vomiting since yesterday before the procedure.  Also having some headache.  States she takes her pain medication every 4-6 hours at home.  Objective: Vitals:   08/14/20 1418 08/14/20 1605  BP: (!) 146/64 (!) 140/56  Pulse: 66 63  Resp: 20 18  Temp:  97.7 F (36.5 C)  SpO2:  94%    Intake/Output Summary (Last 24 hours) at 08/14/2020 1744 Last data filed at 08/14/2020 4097 Gross per 24 hour  Intake 0 ml  Output 2000 ml  Net -2000 ml   Filed Weights   08/13/20 0405 08/13/20 1252 08/14/20 0428  Weight: 82.3 kg 82.3 kg 78.6 kg    ROS: Review of Systems  Respiratory: Negative for shortness of breath.   Cardiovascular: Negative for chest pain.  Gastrointestinal: Positive for nausea and vomiting. Negative for abdominal pain.  Neurological: Positive for headaches.   Exam: Physical Exam HENT:     Head: Normocephalic.     Mouth/Throat:     Pharynx: No oropharyngeal exudate.  Eyes:     General: Lids are normal.     Conjunctiva/sclera: Conjunctivae normal.     Pupils: Pupils are equal, round, and reactive to light.  Cardiovascular:     Rate and Rhythm: Normal rate and regular rhythm.     Heart sounds: Normal heart sounds, S1 normal and S2 normal.  Pulmonary:     Breath sounds: No decreased breath sounds, wheezing, rhonchi or rales.  Abdominal:     Palpations: Abdomen is soft.     Tenderness: There is no abdominal tenderness.  Musculoskeletal:     Right lower leg: No swelling.     Left lower leg: No swelling.  Skin:    General: Skin is warm.     Findings: No rash.  Neurological:     Mental Status: She is alert and oriented to person, place, and time.     Comments: Able to straight leg raise  bilaterally       Data Reviewed: Basic Metabolic Panel: Recent Labs  Lab 08/10/20 1120 08/10/20 1330 08/11/20 0110 08/12/20 0425 08/13/20 1711  NA 139  --  137 135 136  134*  K 2.9*  --  3.3* 3.9 4.3  4.2  CL 99  --  99 95* 96*  99  CO2 28  --  29 29 30  31   GLUCOSE 46*  --  64* 101* 65*  66*  BUN 8  --  6* 10 16  16   CREATININE 3.74*  --  3.06* 4.28* 5.64*  5.62*  CALCIUM 7.7*  --  7.5* 8.2* 8.3*  7.8*  MG  --  1.8 1.8  --   --   PHOS  --   --  2.4*  --  4.0   Liver Function Tests: Recent Labs  Lab 08/10/20 1120 08/12/20 0425 08/13/20 1711  AST 17 15 15   ALT 10 9 9   ALKPHOS 68 70 78  BILITOT 0.5 0.5 0.3  PROT 6.1* 6.0* 6.8  ALBUMIN 2.4* 2.3* 2.5*  2.5*   CBC: Recent Labs  Lab 08/10/20 1120 08/11/20 0110 08/12/20 0425 08/13/20 1711  WBC 4.3 3.5* 4.0 6.2  NEUTROABS  --   --  1.1* 2.4  HGB 7.4* 7.1* 6.9*  8.8*  HCT 22.5* 22.1* 22.1* 27.5*  MCV 87.9 89.1 90.2 87.9  PLT 146* 107* 115* 161  BNP (last 3 results) Recent Labs    05/22/20 0840  BNP 620.7*     CBG: Recent Labs  Lab 08/14/20 0855 08/14/20 1141 08/14/20 1633 08/14/20 1652 08/14/20 1720  GLUCAP 94 84 62* 66* 78    Recent Results (from the past 240 hour(s))  Resp Panel by RT-PCR (Flu A&B, Covid) Nasopharyngeal Swab     Status: None   Collection Time: 08/10/20 11:20 AM   Specimen: Nasopharyngeal Swab; Nasopharyngeal(NP) swabs in vial transport medium  Result Value Ref Range Status   SARS Coronavirus 2 by RT PCR NEGATIVE NEGATIVE Final    Comment: (NOTE) SARS-CoV-2 target nucleic acids are NOT DETECTED.  The SARS-CoV-2 RNA is generally detectable in upper respiratory specimens during the acute phase of infection. The lowest concentration of SARS-CoV-2 viral copies this assay can detect is 138 copies/mL. A negative result does not preclude SARS-Cov-2 infection and should not be used as the sole basis for treatment or other patient management decisions. A negative result may  occur with  improper specimen collection/handling, submission of specimen other than nasopharyngeal swab, presence of viral mutation(s) within the areas targeted by this assay, and inadequate number of viral copies(<138 copies/mL). A negative result must be combined with clinical observations, patient history, and epidemiological information. The expected result is Negative.  Fact Sheet for Patients:  EntrepreneurPulse.com.au  Fact Sheet for Healthcare Providers:  IncredibleEmployment.be  This test is no t yet approved or cleared by the [place] FDA and  has been authorized for detection and/or diagnosis of SARS-CoV-2 by FDA under an Emergency Use Authorization (EUA). This EUA will remain  in effect (meaning this test can be used) for the duration of the COVID-19 declaration under Section 564(b)(1) of the Act, 21 U.S.C.section 360bbb-3(b)(1), unless the authorization is terminated  or revoked sooner.       Influenza A by PCR NEGATIVE NEGATIVE Final   Influenza B by PCR NEGATIVE NEGATIVE Final    Comment: (NOTE) The Xpert Xpress SARS-CoV-2/FLU/RSV plus assay is intended as an aid in the diagnosis of influenza from Nasopharyngeal swab specimens and should not be used as a sole basis for treatment. Nasal washings and aspirates are unacceptable for Xpert Xpress SARS-CoV-2/FLU/RSV testing.  Fact Sheet for Patients: EntrepreneurPulse.com.au  Fact Sheet for Healthcare Providers: IncredibleEmployment.be  This test is not yet approved or cleared by the [place] FDA and has been authorized for detection and/or diagnosis of SARS-CoV-2 by FDA under an Emergency Use Authorization (EUA). This EUA will remain in effect (meaning this test can be used) for the duration of the COVID-19 declaration under Section 564(b)(1) of the Act, 21 U.S.C. section 360bbb-3(b)(1), unless the authorization is terminated  or revoked.  Performed at [facility], [address] [place], [place] [address]   MRSA PCR Screening     Status: None   Collection Time: 08/10/20 10:12 PM   Specimen: Nasal Mucosa; Nasopharyngeal  Result Value Ref Range Status   MRSA by PCR NEGATIVE NEGATIVE Final    Comment:        The GeneXpert MRSA Assay (FDA approved for NASAL specimens only), is one component of a comprehensive MRSA colonization surveillance program. It is not intended to diagnose MRSA infection nor to guide or monitor treatment for MRSA infections. Performed at [facility], [address], [place],  [address]   Culture, blood (Routine X 2) w  Reflex to ID Panel     Status: None (Preliminary result)   Collection Time: 08/11/20 12:51 AM   Specimen: BLOOD  Result Value Ref Range Status   Specimen Description BLOOD BLOOD LEFT HAND  Final   Special Requests   Final    BOTTLES DRAWN AEROBIC ONLY Blood Culture results may not be optimal due to an inadequate volume of blood received in culture bottles   Culture   Final    NO GROWTH 3 DAYS Performed at [facility], [address], [place], [place] [address]    Report Status PENDING  Incomplete  Culture, blood (Routine X 2) w Reflex to ID Panel     Status: None (Preliminary result)   Collection Time: 08/11/20  1:03 AM   Specimen: BLOOD  Result Value Ref Range Status   Specimen Description BLOOD BLOOD LEFT HAND  Final   Special Requests   Final    BOTTLES DRAWN AEROBIC AND ANAEROBIC Blood Culture results may not be optimal due to an inadequate volume of blood received in culture bottles   Culture   Final    NO GROWTH 3 DAYS Performed at [facility], [address] [place], [place] [address]    Report Status PENDING  Incomplete  Aerobic/Anaerobic Culture (surgical/deep wound)     Status: None (Preliminary result)   Collection Time: 08/13/20  2:08 PM   Specimen: Wound; Abscess  Result Value Ref Range  Status   Specimen Description   Final    WOUND Performed at [facility], [address] [place], [place] [address]    Special Requests   Final    [place] Performed at [facility], [place], [place] [address]    Gram Stain NO WBC SEEN NO ORGANISMS SEEN   Final   Culture   Final    NO GROWTH < 12 HOURS Performed at [facility], [place] [address] [place], [place] [address]    Report Status PENDING  Incomplete     Studies: IR INJECT DIAG/THERA/INC NEEDLE/CATH/PLC EPI/CERV/THOR W/IMG  Result Date: 08/13/2020 INDICATION: Concern for discitis/osteomyelitis involving T7-T8. Please perform fluoroscopic guided aspiration for diagnostic purposes. EXAM: IR INJECT/THERA/INC NEEDLE/CATH/PLC EPI/CERV/THOR W/IMG COMPARISON:  Thoracic spine MRI-08/11/2020; chest CT-08/10/2020 MEDICATIONS: None ANESTHESIA/SEDATION: Moderate (conscious) sedation was employed during this procedure. A total of Fentanyl 50 mcg Versed 1 mg was administered intravenously. Moderate Sedation Time: 20 minutes. The patient's level of consciousness and vital signs were monitored continuously by radiology nursing throughout the procedure under my direct supervision. CONTRAST:  None FLUOROSCOPY TIME:  1 minute, 48 seconds (79.0 mGy) COMPLICATIONS: None immediate. PROCEDURE: Informed written consent was obtained from the patient after a discussion of the risks, benefits and alternatives to treatment. A timeout was performed prior to the initiation of the procedure. The patient was positioned prone on the fluoroscopy table. The T7-T8 intervertebral disc space was marked fluoroscopically. Utilizing an oblique, left-sided approach, a 17 gauge trocar was advanced into the targeted intervertebral disc. Needle positioning within the left posterolateral aspect of the T7-T8 intervertebral disc was confirmed with the fluoroscopic imaging in both the anterior and lateral projections. Multiple spot fluoroscopic  images were saved for procedural documentation purposes. With needle positioning confirmed attempts were made to perform aspiration, however no fluid was able to be aspirated. As such approximately 2 cc saline was administered and subsequently aspirated yielding approximately 1 cc blood tinged fluid. All aspirated fluid was capped and sent to the laboratory analysis. The  needle was removed and superficial hemostasis was achieved with manual compression. A dressing was applied. The patient tolerated the procedure well without immediate postprocedural complication. IMPRESSION: Technically successful fluoroscopic guided T7-T8 disc lavage aspiration. Electronically Signed   By: Sandi Mariscal M.D.   On: 08/13/2020 14:40    Scheduled Meds: . sodium chloride   Intravenous Once  . acetaminophen  650 mg Oral Once  . amLODipine  10 mg Oral Daily  . ascorbic acid  500 mg Oral Q breakfast  . atorvastatin  20 mg Oral QPM  . carvedilol  3.125 mg Oral BID WC  . Chlorhexidine Gluconate Cloth  6 each Topical Q0600  . cholecalciferol  1,000 Units Oral Daily  . cholestyramine light  4 g Oral BID  . epoetin (EPOGEN/PROCRIT) injection  8,000 Units Intravenous Q M,W,F-HD  . feeding supplement  237 mL Oral Q24H  . ferrous sulfate  325 mg Oral Q breakfast  . furosemide  80 mg Oral Daily  . gabapentin  100 mg Oral QPM  . hydrALAZINE  50 mg Oral Q8H  . hydrOXYzine  25 mg Oral Daily  . lipase/protease/amylase  36,000 Units Oral TID AC  . multivitamin  1 tablet Oral Daily  . omega-3 acid ethyl esters  1 capsule Oral BID  . pantoprazole (PROTONIX) IV  40 mg Intravenous Q12H  . saccharomyces boulardii  250 mg Oral Daily  . sevelamer carbonate  800 mg Oral BID WC  . sodium chloride flush  3 mL Intravenous Q12H  . cyanocobalamin  1,000 mcg Oral Daily   Continuous Infusions: . sodium chloride      Assessment/Plan:  1. T7/T8 discitis versus osteomyelitis.  Interventional radiology was able to get an aspirate of the  disc yesterday.  So far nothing is growing out of the cultures.  Infectious disease consult appreciated but currently holding off on antibiotics.  May end up needing another aspirate but will leave that up to infectious disease.  Holding aspirin until we know for sure that we do not need another procedure. 2. Nausea vomiting and headache.  As needed Phenergan.  Decreased her frequency of her pain medications.  With multiple drug allergies limited with medications.  Increase Protonix 40 mg twice daily 3. Acute hypoxic respiratory failure with pulse ox of 87% on room air on the 19th. 4. Acute diastolic congestive heart failure.  Patient had dialysis initially and lungs sound clear at this point.  Dialysis is the only way to remove fluid 5. Anemia of chronic disease.  I did give 1 unit of packed red blood cells during the hospital course because of a hemoglobin of 6.9.  Hemoglobin did come up to 8.8. 6. Relative hypoglycemia.  Hemoglobin A1c low at 3.9.  Patient is not a diabetic.  Off IV fluids at this point  7. end-stage renal disease on dialysis Monday Wednesday Friday 8. History of renal cell cancer status post cryoablation 9. Chronic pancreatitis on Creon before meals     Code Status:     Code Status Orders  (From admission, onward)         Start     Ordered   08/10/20 1323  Full code  Continuous        08/10/20 1323        Code Status History    Date Active Date Inactive Code Status Order ID Comments User Context   06/02/2020 2251 06/25/2020 2316 Full Code [id number]  Elwyn Reach, MD ED   05/22/2020 1201 05/23/2020  2050 Full Code [id number]  Vashti Hey, MD ED   05/02/2020 2057 05/06/2020 1929 Full Code [id number]  Mansy, Arvella Merles, MD ED   04/13/2020 1458 04/14/2020 2239 Full Code [id number]  Sela Hua, PA-C Inpatient   11/24/2019 2038 11/30/2019 1824 Full Code [id number]  Artist Beach, MD Inpatient   11/11/2019 0715 11/19/2019 0006 Full Code [id number]  Awilda Bill, NP ED   09/30/2019 2126 10/01/2019 2257 Full Code [id number]  Nolberto Hanlon, MD ED   10/04/2018 1847 10/12/2018 0027 Full Code [id number]  Dustin Flock, MD Inpatient   06/18/2018 0449 06/20/2018 1642 Full Code [id number]  Arta Silence, MD ED   02/12/2018 1312 02/16/2018 0050 Full Code [id number]  Epifanio Lesches, MD ED   01/17/2018 1848 01/21/2018 1341 Full Code [id number]  Vaughan Basta, MD Inpatient   09/30/2017 1804 10/01/2017 2238 Full Code [id number]  Vaughan Basta, MD Inpatient   09/03/2017 2243 09/05/2017 1901 Full Code [id number]  Lance Coon, MD Inpatient   05/04/2017 1833 05/05/2017 2330 Full Code [id number]  Henreitta Leber, MD Inpatient   03/23/2017 1551 03/25/2017 1739 Full Code [id number]  Gladstone Lighter, MD Inpatient   04/21/2016 1559 04/21/2016 1717 Full Code [id number]  Nicholes Mango, MD Inpatient   04/08/2016 1415 04/11/2016 1947 Full Code [id number]  Theodoro Grist, MD Inpatient   03/15/2016 1322 03/20/2016 0019 Full Code [id number]  Bettey Costa, MD Inpatient   02/16/2016 1409 02/19/2016 2238 Full Code [id number]  Bettey Costa, MD ED   12/17/2015 1139 12/21/2015 2209 Full Code [id number]  Dustin Flock, MD ED   11/12/2015 1602 12/01/2015 1943 Full Code [id number]  Bettey Costa, MD Inpatient   10/07/2015 0937 10/12/2015 1959 Full Code [id number]  Fritzi Mandes, MD ED   10/03/2015 0355 10/05/2015 2230 Full Code [id number]  Lance Coon, MD Inpatient   08/31/2015 1600 09/03/2015 2209 Full Code [id number]  Demetrios Loll, MD ED   07/26/2015 1528 07/26/2015 2136 Full Code [id number]  Dionisio David, MD Inpatient   07/22/2015 1736 07/26/2015 1528 Full Code [id number]  Vaughan Basta, MD Inpatient   06/05/2015 2039 06/07/2015 2003 Full Code [id number]  Bettey Costa, MD Inpatient   05/21/2015 2142 05/24/2015 1626 Full Code [id number]  Henreitta Leber, MD Inpatient   04/24/2015 0855 04/26/2015 2211 Full Code [id number]  Demetrios Loll, MD Inpatient   04/01/2015 1558 04/05/2015 2007 Full Code [id number]  Epifanio Lesches, MD ED   03/06/2015 0805 03/09/2015 2111 Full Code [id number]  Juluis Mire, MD Inpatient   02/19/2015 1614 02/24/2015 1741 Full Code [id number]  Aldean Jewett, MD Inpatient   Advance Care Planning Activity     Family Communication: Spoke with son on the phone Disposition Plan: Status is: Inpatient  Dispo: The patient is from: Home              Anticipated d/c is to: Home              Anticipated d/c date is: Likely will need another couple days in the hospital              Patient currently having nausea vomiting and headache.  Consultants:  Infectious disease  Nephrology  Procedures:  Thoracic disc aspiration for culture  Time spent: 27 minutes  [place]

## 2020-08-14 NOTE — Progress Notes (Signed)
ID Pt says she is feeling okay Has back pain between the shoulder blades Says it is chronic No fever Rt graft site tender on palpation  Objective Patient Vitals for the past 24 hrs:  BP Temp Temp src Pulse Resp SpO2 Weight  08/14/20 2009 128/60 97.8 F (36.6 C) Oral 62 20 98 % --  08/14/20 1605 (!) 140/56 97.7 F (36.5 C) Oral 63 18 94 % --  08/14/20 1418 (!) 146/64 -- -- 66 20 -- --  08/14/20 1148 -- -- -- -- -- 92 % --  08/14/20 0804 (!) 164/63 98.1 F (36.7 C) Oral 61 16 100 % --  08/14/20 0428 -- -- -- -- -- -- 78.6 kg  08/14/20 0414 (!) 146/56 97.9 F (36.6 C) Oral 63 12 92 % --   Pale, no distress Chest b/l air entry [id number] abd soft Left IJ catheter  Labs CBC Latest Ref Rng & Units 08/13/2020 08/12/2020 08/11/2020  WBC 4.0 - 10.5 K/uL 6.2 4.0 3.5(L)  Hemoglobin 12.0 - 15.0 g/dL 8.8(L) 6.9(L) 7.1(L)  Hematocrit 36 - 46 % 27.5(L) 22.1(L) 22.1(L)  Platelets 150 - 400 K/uL 161 115(L) 107(L)    CMP Latest Ref Rng & Units 08/13/2020 08/13/2020 08/12/2020  Glucose 70 - 99 mg/dL 65(L) 66(L) 101(H)  BUN 8 - 23 mg/dL 16 16 10   Creatinine 0.44 - 1.00 mg/dL 5.64(H) 5.62(H) 4.28(H)  Sodium 135 - 145 mmol/L 136 134(L) 135  Potassium 3.5 - 5.1 mmol/L 4.3 4.2 3.9  Chloride 98 - 111 mmol/L 96(L) 99 95(L)  CO2 22 - 32 mmol/L 30 31 29   Calcium 8.9 - 10.3 mg/dL 8.3(L) 7.8(L) 8.2(L)  Total Protein 6.5 - 8.1 g/dL - 6.8 6.0(L)  Total Bilirubin 0.3 - 1.2 mg/dL - 0.3 0.5  Alkaline Phos 38 - 126 U/L - 78 70  AST 15 - 41 U/L - 15 15  ALT 0 - 44 U/L - 9 9   Micro 11/20 BC ng 11/22 - disc aspirate -gram stain no wbc no organism seen  Impression/recommendation Presented with SOB and chest pain  CHF/Acute pulmonary edema and acute hypoxic resp failure- getting dialysis Anemia Incidental finding of Progressive sclerotic and destructive features along the anterior T7-T8 disc space with some adjacent paravertebral soft tissue thickening highly concerning for developing  osteomyelitis with paravertebral abscess/phlegmon on CT chest Underwent aspiration of disc space and 1 cc of bloody fluid sent for culture Not on any antibiotics currently- stable with normal wbc, no fever- will hold off on starting antibiotics until culture /gram stain shows something- Do not want to start antibiotics prematurely as a repeat biopsy/aspiration may be needed if current culture neg If she decompensates or clinically her condition changes then would start vancomycin and cefepime.?  Anemia ? ESRD on dialysis  Recent enterococcus bacteremia- treated with 6 weeks of IV antibiotic TEE was negative  Recent Hero graft infection with enterococcus /p removal and got appropriate antibiotics ( 4 weeks after removal and 2 weeks before)  Discussed the management with the patient

## 2020-08-14 NOTE — Progress Notes (Signed)
Patient refused orthostatic vitals.

## 2020-08-14 NOTE — Progress Notes (Signed)
Patient refuses cardiac monitoring. Will continue to monitor.  [name]

## 2020-08-14 NOTE — Treatment Plan (Addendum)
Pt refused labs from this morning, CBC and green top. Refused twice . MD [place] made aware via text message.

## 2020-08-14 NOTE — Progress Notes (Signed)
[facility] Liaison note:  New referral for [employer] to follow at home received from [facility]. Patient information sent to referral. Patient will also be followed by [facility] home health.  Flo Shanks BSN, RN, [place] [phone]

## 2020-08-14 NOTE — Progress Notes (Signed)
Daily Progress Note   Patient Name: [name]       Date: 08/14/2020 DOB: Mar 28, 1961  Age: 59 y.o. MRN#: [id number] Attending Physician: Loletha Grayer, MD Primary Care Physician: [facility] Date: 08/10/2020  Reason for Consultation/Follow-up: Establishing goals of care  Subjective: [name] is resting in bed. Reports nausea. Notes this is common for her on days after her dialysis.  Advanced Directives were at bedside- not completed- she stated that noone discussed them with her and she didn't realize they were there- she requests Chaplain return to assist her with this.  Attempted to review MOST form with patient, she was not feeling well enough at this time.   Review of Systems  Gastrointestinal: Positive for nausea.  Skin: Positive for itching.    Length of Stay: 3  Current Medications: Scheduled Meds:  . sodium chloride   Intravenous Once  . acetaminophen  650 mg Oral Once  . amLODipine  10 mg Oral Daily  . ascorbic acid  500 mg Oral Q breakfast  . atorvastatin  20 mg Oral QPM  . carvedilol  3.125 mg Oral BID WC  . Chlorhexidine Gluconate Cloth  6 each Topical Q0600  . cholecalciferol  1,000 Units Oral Daily  . cholestyramine light  4 g Oral BID  . epoetin (EPOGEN/PROCRIT) injection  8,000 Units Intravenous Q M,W,F-HD  . feeding supplement  237 mL Oral Q24H  . ferrous sulfate  325 mg Oral Q breakfast  . furosemide  80 mg Oral Daily  . gabapentin  100 mg Oral QPM  . hydrALAZINE  50 mg Oral Q8H  . hydrOXYzine  25 mg Oral Daily  . lipase/protease/amylase  36,000 Units Oral TID AC  . multivitamin  1 tablet Oral Daily  . omega-3 acid ethyl esters  1 capsule Oral BID  . pantoprazole  40 mg Oral Daily  . saccharomyces boulardii  250 mg Oral  Daily  . sevelamer carbonate  800 mg Oral BID WC  . sodium chloride flush  3 mL Intravenous Q12H  . cyanocobalamin  1,000 mcg Oral Daily    Continuous Infusions: . sodium chloride      PRN Meds: sodium chloride, acetaminophen, albuterol, alum & mag hydroxide-simeth, ammonium lactate, calcium carbonate, dextromethorphan-guaiFENesin, dextrose, diphenhydrAMINE, hydrALAZINE, HYDROcodone-acetaminophen, morphine injection, mupirocin ointment, nitroGLYCERIN, promethazine, sodium  chloride flush  Physical Exam Vitals and nursing note reviewed.  Constitutional:      Comments: lethargic  Pulmonary:     Effort: Pulmonary effort is normal.  Neurological:     Mental Status: She is oriented to person, place, and time.  Psychiatric:        Mood and Affect: Mood normal.        Behavior: Behavior normal.             Vital Signs: BP (!) 164/63 (BP Location: Right Arm)   Pulse 61   Temp 98.1 F (36.7 C) (Oral)   Resp 16   Ht 5\' 5"  (1.651 m)   Wt 78.6 kg   SpO2 92%   BMI 28.84 kg/m  SpO2: SpO2: 92 % O2 Device: O2 Device: Room Air O2 Flow Rate: O2 Flow Rate (L/min): 2 L/min  Intake/output summary:   Intake/Output Summary (Last 24 hours) at 08/14/2020 1151 Last data filed at 08/14/2020 4431 Gross per 24 hour  Intake 240 ml  Output 2000 ml  Net -1760 ml   LBM: Last BM Date: 08/14/20 Baseline Weight: Weight: 80.3 kg Most recent weight: Weight: 78.6 kg       Palliative Assessment/Data: PPS: 60%      Patient Active Problem List   Diagnosis Date Noted  . Advanced care planning/counseling discussion   . Discitis of thoracic region   . Fluid overload 08/10/2020  . HLD (hyperlipidemia) 08/10/2020  . Chest pain 08/10/2020  . CAD (coronary artery disease) 08/10/2020  . GERD (gastroesophageal reflux disease) 08/10/2020  . Obesity (BMI 30-39.9) 06/06/2020  . Anemia in ESRD (end-stage renal disease) ([place]) 05/31/2020  . Type 1 diabetes mellitus ([place]) 05/31/2020  . End-stage renal  disease ([place]) 05/31/2020  . Hypertensive disorder 05/31/2020  . Arm pain 05/21/2020  . Cellulitis of right leg 05/02/2020  . ESRD (end stage renal disease) ([place]) 04/13/2020  . SOBOE (shortness of breath on exertion) 02/08/2020  . Hematoma of arm, left, subsequent encounter 11/24/2019  . Respiratory failure ([place])   . Shortness of breath   . Complication of arteriovenous dialysis fistula 11/13/2019  . PVD (peripheral vascular disease) ([place]) 11/13/2019  . Severe anemia 11/13/2019  . Pressure injury of skin 11/13/2019  . ESRD on hemodialysis ([place])   . Hemorrhagic shock ([place]) 11/11/2019  . Leg pain 10/02/2019  . CAP (community acquired pneumonia) 10/01/2019  . Atherosclerosis of native arteries of extremity with intermittent claudication ([place]) 05/01/2019  . Right-sided headache   . COPD (chronic obstructive pulmonary disease) ([place]) 10/06/2018  . Syncope 10/04/2018  . Symptomatic anemia 06/18/2018  . Gastroparesis due to DM ([place]) 01/18/2018  . Complication of vascular access for dialysis 12/04/2017  . Osteomyelitis ([place]) 09/30/2017  . Carotid stenosis 06/18/2017  . Bilateral carotid artery stenosis 06/16/2017  . Benign essential HTN 05/19/2017  . CKD (chronic kidney disease) stage 5, GFR less than 15 ml/min (HCC) 05/19/2017  . Hyperlipidemia, mixed 05/19/2017  . Shortness of breath 05/04/2017  . Rash, skin 12/16/2016  . Cellulitis of lower extremity 07/29/2016  . Chronic venous insufficiency 07/29/2016  . Lymphedema 07/29/2016  . TIA (transient ischemic attack) 04/21/2016  . Altered mental status 04/08/2016  . Hyperammonemia ([place]) 04/08/2016  . Elevated troponin 04/08/2016  . Depression 04/08/2016  . Depression, major, recurrent, severe with psychosis ([place]) 04/08/2016  . Blood in stool   . Intractable cyclical vomiting with nausea   . Reflux esophagitis   . Gastritis   . Generalized abdominal pain   .  Uncontrollable vomiting   . Major depressive disorder, recurrent episode,  moderate ([place]) 03/15/2016  . Adjustment disorder with mixed anxiety and depressed mood 03/15/2016  . Malnutrition of moderate degree 12/01/2015  . Renal mass   . Dyspnea   . Acute renal failure ([place])   . Respiratory failure ([place])   . High temperature 11/14/2015  . Encounter for central line placement   . Encounter for orogastric (OG) tube placement   . Nausea 11/12/2015  . Hyperkalemia 10/03/2015  . Diarrhea, unspecified 07/22/2015  . Pneumonia 05/21/2015  . Hypoglycemia 04/24/2015  . Unresponsiveness 04/24/2015  . Sinus bradycardia 04/24/2015  . Hypothermia 04/24/2015  . Acute respiratory failure ([place]) 04/24/2015  . Acute on chronic diastolic CHF (congestive heart failure) ([place]) 04/05/2015  . Diabetic gastroparesis ([place]) 04/05/2015  . Hypokalemia 04/05/2015  . Generalized weakness 04/05/2015  . Acute pulmonary edema ([place]) 04/03/2015  . Nausea and vomiting 04/03/2015  . Hypoglycemia associated with diabetes ([place]) 04/03/2015  . Anemia of chronic disease 04/03/2015  . Secondary hyperparathyroidism ([place]) 04/03/2015  . Pressure ulcer 04/02/2015  . Acute respiratory failure with hypoxia ([place]) 04/01/2015  . Adjustment disorder with anxiety 03/14/2015  . Somatic symptom disorder, mild 03/08/2015  . Coronary artery disease involving native coronary artery of native heart without angina pectoris   . Nausea & vomiting 03/06/2015  . Abdominal pain 03/06/2015  . Type 2 diabetes mellitus with hypoglycemia without coma, without long-term current use of insulin ([place]) 03/06/2015  . HTN (hypertension) 03/06/2015  . Gastroparesis 02/24/2015  . Pleural effusion 02/19/2015  . Bacteremia due to Enterococcus 02/19/2015  . End-stage renal disease on hemodialysis ([place]) 02/19/2015  . Diarrhea 08/01/2014    Palliative Care Assessment & Plan   Patient Profile: 59 y.o. female  with past medical history of renal cell carcinoma status post cryoablation, diabetes, end-stage renal disease on HD, CHF  with ejection fraction 55 to 60% admitted on 08/10/2020 with chest pain and shortness of breath.  Chest x-ray showed pulmonary edema, she is breathing treated for this by dialysis.  There was additional incidental finding of mass on her thoracic spine concerning for osteomyelitis versus metastatic disease.  She is scheduled for a biopsy this afternoon.  Palliative medicine consulted for goals of care.  Assessment/Recommendations/Plan  Chaplain consult for AD Recommend outpatient Palliative to follow  Goals of Care and Additional Recommendations: Limitations on Scope of Treatment: Full Scope Treatment  Code Status: Full code  Prognosis:  Unable to determine  Discharge Planning: Home with [place] was discussed with patient.  Thank you for allowing the Palliative Medicine Team to assist in the care of this patient.   Total time: 27 mins Greater than 50%  of this time was spent counseling and coordinating care related to the above assessment and plan.  Mariana Kaufman, AGNP-C Palliative Medicine   Please contact Palliative Medicine Team phone at [phone] for questions and concerns.

## 2020-08-14 NOTE — Progress Notes (Signed)
[facility]  ROUNDING NOTE   Subjective:   [name] is 59 years old female, well known to our practice,presented to the ED  on 08/10/2020, with worsening SOB. She also reported chest pain, which resolved prior to  ED arrival. SpO2 was 83% on arrival, currently 100% on 3L O2 via nasal cannula.Patient has h/o ESRD, on dialysis MWF.  Patient sleeping in bed, arousable to call.She denies worsening SOB,nausea or vomiting, reports consuming breakfast this morning with fair appetite.No nausea or vomiting. Objective:  Vital signs in last 24 hours:  Temp:  [97.9 F (36.6 C)-98.3 F (36.8 C)] 98.1 F (36.7 C) (11/23 0804) Pulse Rate:  [59-66] 61 (11/23 0804) Resp:  [8-20] 16 (11/23 0804) BP: (98-166)/(54-88) 164/63 (11/23 0804) SpO2:  [92 %-100 %] 92 % (11/23 1148) Weight:  [78.6 kg] 78.6 kg (11/23 0428)  Weight change: 0 kg Filed Weights   08/13/20 0405 08/13/20 1252 08/14/20 0428  Weight: 82.3 kg 82.3 kg 78.6 kg    Intake/Output: I/O last 3 completed shifts: In: 59 [P.O.:480; Blood:325] Out: 2000 [Other:2000]   Intake/Output this shift:  No intake/output data recorded.  Physical Exam: General: In no acute distress  Head: Normocephalic,atraumatic  Eyes: Sclerae and conjunctivae clear  Lungs:  Respiration symmetrical,unlabored, lungs clear  Heart: Regular  Abdomen:  Soft, nontender  Extremities:  Trace  peripheral edema   Neurologic: Sleeping,arousable to call, moving all four extremities  Skin: No acute lesions or rashes  Access: Lt IJ catheter    Basic Metabolic Panel: Recent Labs  Lab 08/10/20 1120 08/10/20 1120 08/10/20 1330 08/11/20 0110 08/12/20 0425 08/13/20 1711  NA 139  --   --  137 135 136  134*  K 2.9*  --   --  3.3* 3.9 4.3  4.2  CL 99  --   --  99 95* 96*  99  CO2 28  --   --  29 29 30  31   GLUCOSE 46*  --   --  64* 101* 65*  66*  BUN 8  --   --  6* 10 16  16   CREATININE 3.74*  --   --  3.06* 4.28* 5.64*  5.62*  CALCIUM 7.7*    < >  --  7.5* 8.2* 8.3*  7.8*  MG  --   --  1.8 1.8  --   --   PHOS  --   --   --  2.4*  --  4.0   < > = values in this interval not displayed.    Liver Function Tests: Recent Labs  Lab 08/10/20 1120 08/12/20 0425 08/13/20 1711  AST 17 15 15   ALT 10 9 9   ALKPHOS 68 70 78  BILITOT 0.5 0.5 0.3  PROT 6.1* 6.0* 6.8  ALBUMIN 2.4* 2.3* 2.5*  2.5*   No results for input(s): LIPASE, AMYLASE in the last 168 hours. No results for input(s): AMMONIA in the last 168 hours.  CBC: Recent Labs  Lab 08/10/20 1120 08/11/20 0110 08/12/20 0425 08/13/20 1711  WBC 4.3 3.5* 4.0 6.2  NEUTROABS  --   --  1.1* 2.4  HGB 7.4* 7.1* 6.9* 8.8*  HCT 22.5* 22.1* 22.1* 27.5*  MCV 87.9 89.1 90.2 87.9  PLT 146* 107* 115* 161    Cardiac Enzymes: No results for input(s): CKTOTAL, CKMB, CKMBINDEX, TROPONINI in the last 168 hours.  BNP: Invalid input(s): POCBNP  CBG: Recent Labs  Lab 08/14/20 0410 08/14/20 0745 08/14/20 0816 08/14/20 0855 08/14/20 1141  GLUCAP 93 63* 68* 94 84    Microbiology: Results for orders placed or performed during the hospital encounter of 08/10/20  Resp Panel by RT-PCR (Flu A&B, Covid) Nasopharyngeal Swab     Status: None   Collection Time: 08/10/20 11:20 AM   Specimen: Nasopharyngeal Swab; Nasopharyngeal(NP) swabs in vial transport medium  Result Value Ref Range Status   SARS Coronavirus 2 by RT PCR NEGATIVE NEGATIVE Final    Comment: (NOTE) SARS-CoV-2 target nucleic acids are NOT DETECTED.  The SARS-CoV-2 RNA is generally detectable in upper respiratory specimens during the acute phase of infection. The lowest concentration of SARS-CoV-2 viral copies this assay can detect is 138 copies/mL. A negative result does not preclude SARS-Cov-2 infection and should not be used as the sole basis for treatment or other patient management decisions. A negative result may occur with  improper specimen collection/handling, submission of specimen other than  nasopharyngeal swab, presence of viral mutation(s) within the areas targeted by this assay, and inadequate number of viral copies(<138 copies/mL). A negative result must be combined with clinical observations, patient history, and epidemiological information. The expected result is Negative.  Fact Sheet for Patients:  EntrepreneurPulse.com.au  Fact Sheet for Healthcare Providers:  IncredibleEmployment.be  This test is no t yet approved or cleared by the [place] FDA and  has been authorized for detection and/or diagnosis of SARS-CoV-2 by FDA under an Emergency Use Authorization (EUA). This EUA will remain  in effect (meaning this test can be used) for the duration of the COVID-19 declaration under Section 564(b)(1) of the Act, 21 U.S.C.section 360bbb-3(b)(1), unless the authorization is terminated  or revoked sooner.       Influenza A by PCR NEGATIVE NEGATIVE Final   Influenza B by PCR NEGATIVE NEGATIVE Final    Comment: (NOTE) The Xpert Xpress SARS-CoV-2/FLU/RSV plus assay is intended as an aid in the diagnosis of influenza from Nasopharyngeal swab specimens and should not be used as a sole basis for treatment. Nasal washings and aspirates are unacceptable for Xpert Xpress SARS-CoV-2/FLU/RSV testing.  Fact Sheet for Patients: EntrepreneurPulse.com.au  Fact Sheet for Healthcare Providers: IncredibleEmployment.be  This test is not yet approved or cleared by the [place] FDA and has been authorized for detection and/or diagnosis of SARS-CoV-2 by FDA under an Emergency Use Authorization (EUA). This EUA will remain in effect (meaning this test can be used) for the duration of the COVID-19 declaration under Section 564(b)(1) of the Act, 21 U.S.C. section 360bbb-3(b)(1), unless the authorization is terminated or revoked.  Performed at [facility], [address] [place], [place]  [address]   MRSA PCR Screening     Status: None   Collection Time: 08/10/20 10:12 PM   Specimen: Nasal Mucosa; Nasopharyngeal  Result Value Ref Range Status   MRSA by PCR NEGATIVE NEGATIVE Final    Comment:        The GeneXpert MRSA Assay (FDA approved for NASAL specimens only), is one component of a comprehensive MRSA colonization surveillance program. It is not intended to diagnose MRSA infection nor to guide or monitor treatment for MRSA infections. Performed at [facility], [address] [place], [place] [address]   Culture, blood (Routine X 2) w Reflex to ID Panel     Status: None (Preliminary result)   Collection Time: 08/11/20 12:51 AM   Specimen: BLOOD  Result Value Ref Range Status   Specimen Description BLOOD BLOOD LEFT HAND  Final   Special Requests   Final  BOTTLES DRAWN AEROBIC ONLY Blood Culture results may not be optimal due to an inadequate volume of blood received in culture bottles   Culture   Final    NO GROWTH 3 DAYS Performed at [facility], [address] [place], [place] [address]    Report Status PENDING  Incomplete  Culture, blood (Routine X 2) w Reflex to ID Panel     Status: None (Preliminary result)   Collection Time: 08/11/20  1:03 AM   Specimen: BLOOD  Result Value Ref Range Status   Specimen Description BLOOD BLOOD LEFT HAND  Final   Special Requests   Final    BOTTLES DRAWN AEROBIC AND ANAEROBIC Blood Culture results may not be optimal due to an inadequate volume of blood received in culture bottles   Culture   Final    NO GROWTH 3 DAYS Performed at [facility], [address], [place], [place] [address]    Report Status PENDING  Incomplete  Aerobic/Anaerobic Culture (surgical/deep wound)     Status: None (Preliminary result)   Collection Time: 08/13/20  2:08 PM   Specimen: Wound; Abscess  Result Value Ref Range Status   Specimen Description   Final    WOUND Performed at [facility], [address] [place], [place] [address]    Special Requests   Final    [place] Performed at [facility], [place], [place] [address]    Gram Stain NO WBC SEEN NO ORGANISMS SEEN   Final   Culture   Final    NO GROWTH < 12 HOURS Performed at [facility], [place] [address], [place], [place] [address]    Report Status PENDING  Incomplete    Coagulation Studies: No results for input(s): LABPROT, INR in the last 72 hours.  Urinalysis: No results for input(s): COLORURINE, LABSPEC, PHURINE, GLUCOSEU, HGBUR, BILIRUBINUR, KETONESUR, PROTEINUR, UROBILINOGEN, NITRITE, LEUKOCYTESUR in the last 72 hours.  Invalid input(s): APPERANCEUR    Imaging: IR INJECT DIAG/THERA/INC NEEDLE/CATH/PLC EPI/CERV/THOR W/IMG  Result Date: 08/13/2020 INDICATION: Concern for discitis/osteomyelitis involving T7-T8. Please perform fluoroscopic guided aspiration for diagnostic purposes. EXAM: IR INJECT/THERA/INC NEEDLE/CATH/PLC EPI/CERV/THOR W/IMG COMPARISON:  Thoracic spine MRI-08/11/2020; chest CT-08/10/2020 MEDICATIONS: None ANESTHESIA/SEDATION: Moderate (conscious) sedation was employed during this procedure. A total of Fentanyl 50 mcg Versed 1 mg was administered intravenously. Moderate Sedation Time: 20 minutes. The patient's level of consciousness and vital signs were monitored continuously by radiology nursing throughout the procedure under my direct supervision. CONTRAST:  None FLUOROSCOPY TIME:  1 minute, 48 seconds (96.2 mGy) COMPLICATIONS: None immediate. PROCEDURE: Informed written consent was obtained from the patient after a discussion of the risks, benefits and alternatives to treatment. A timeout was performed prior to the initiation of the procedure. The patient was positioned prone on the fluoroscopy table. The T7-T8 intervertebral disc space was marked fluoroscopically. Utilizing an oblique, left-sided approach, a 17 gauge trocar was advanced into the targeted intervertebral  disc. Needle positioning within the left posterolateral aspect of the T7-T8 intervertebral disc was confirmed with the fluoroscopic imaging in both the anterior and lateral projections. Multiple spot fluoroscopic images were saved for procedural documentation purposes. With needle positioning confirmed attempts were made to perform aspiration, however no fluid was able to be aspirated. As such approximately 2 cc saline was administered and subsequently aspirated yielding approximately 1 cc blood tinged fluid. All aspirated fluid was capped and sent to the laboratory analysis. The needle was removed and superficial hemostasis was achieved  with manual compression. A dressing was applied. The patient tolerated the procedure well without immediate postprocedural complication. IMPRESSION: Technically successful fluoroscopic guided T7-T8 disc lavage aspiration. Electronically Signed   By: Sandi Mariscal M.D.   On: 08/13/2020 14:40     Medications:   . sodium chloride     . sodium chloride   Intravenous Once  . acetaminophen  650 mg Oral Once  . amLODipine  10 mg Oral Daily  . ascorbic acid  500 mg Oral Q breakfast  . atorvastatin  20 mg Oral QPM  . carvedilol  3.125 mg Oral BID WC  . Chlorhexidine Gluconate Cloth  6 each Topical Q0600  . cholecalciferol  1,000 Units Oral Daily  . cholestyramine light  4 g Oral BID  . epoetin (EPOGEN/PROCRIT) injection  8,000 Units Intravenous Q M,W,F-HD  . feeding supplement  237 mL Oral Q24H  . ferrous sulfate  325 mg Oral Q breakfast  . furosemide  80 mg Oral Daily  . gabapentin  100 mg Oral QPM  . hydrALAZINE  50 mg Oral Q8H  . hydrOXYzine  25 mg Oral Daily  . lipase/protease/amylase  36,000 Units Oral TID AC  . multivitamin  1 tablet Oral Daily  . omega-3 acid ethyl esters  1 capsule Oral BID  . pantoprazole (PROTONIX) IV  40 mg Intravenous Q12H  . saccharomyces boulardii  250 mg Oral Daily  . sevelamer carbonate  800 mg Oral BID WC  . sodium chloride flush   3 mL Intravenous Q12H  . cyanocobalamin  1,000 mcg Oral Daily   sodium chloride, acetaminophen, albuterol, alum & mag hydroxide-simeth, ammonium lactate, calcium carbonate, dextromethorphan-guaiFENesin, dextrose, diphenhydrAMINE, hydrALAZINE, HYDROcodone-acetaminophen, morphine injection, mupirocin ointment, nitroGLYCERIN, promethazine, sodium chloride flush  Assessment/ Plan:  [name] is a 59 y.o.  female well known to our practice,presented to the ED, with worsening SOB. She also reported chest pain, which resolved prior to  ED arrival. SpO2 was 83% on arrival, currently 100% on 3L O2 via nasal cannula. Patient has h/o ESRD, on dialysis MWF. We will arrange dialysis for her today.Orders placed.  #ESRD on dialysis MWF Received dialysis yesterday Volume and electrolyte status acceptable No acute indication for additional dialysis today Will continue MWF schedule  # Hypertension Blood pressure readings 164/63 today Current regimen include  Amlodipine,Carvedilol,Furosemide and Hydralazine  #Secondary Hyperparathyroidism Lab Results  Component Value Date   PTH 357 (H) 10/05/2018   CALCIUM 7.8 (L) 08/13/2020   CALCIUM 8.3 (L) 08/13/2020   CAION 1.09 (L) 04/13/2020   PHOS 4.0 08/13/2020  Patient is on Sevelamer  # Acute on chronic diastolic CHF Last 2D Echo on 06/11/2020  with EF 50 to 55% Patient continues to be on 2-3L of O2, no worsening SOB  Chest Xray on 08/10/2020  IMPRESSION: 1. Findings consistent with volume overload/CHF. Difficult to exclude the possibility of concomitant infection particularly in the LEFT lung base. 2. New LEFT-sided dialysis catheter, tip in the mid to low RIGHT Atrium.  #Anemia of Chronic Kidney Disease Lab Results  Component Value Date   HGB 8.8 (L) 08/13/2020  Continue  Epogen 8000 units with dialysis Received PRBC transfusion during this admission  LOS: 3 Kevyn Wengert 11/23/20211:50 PM

## 2020-08-14 NOTE — TOC Progression Note (Addendum)
Transition of Care [facility]) - Progression Note    Patient Details  Name: [name] MRN: [id number] Date of Birth: 1961/07/20  [facility] CM/SW Contact  Candie Chroman, LCSW Phone Number: 08/14/2020, 9:36 AM  Clinical Narrative: Per TOC initial assessment, patient was active with a home health agency prior to admission. Patient on isolation precautions. Tried calling her in room. No answer. Called son who stated she is active with [facility]. CSW called [employer]. Patient is active with RN and PT services.  12:27 pm: CSW acknowledges consult for outpatient palliative referral. Per palliative NP, patient is agreeable. CSW made referral to Flo Shanks, RN with [facility].    Expected Discharge Plan: [place] Barriers to Discharge: Continued Medical Work up  Expected Discharge Plan and Services Expected Discharge Plan: [place] Choice: Resumption of Svcs/PTA Provider Living arrangements for the past 2 months: Apartment                                       Social Determinants of Health (SDOH) Interventions    Readmission Risk Interventions Readmission Risk Prevention Plan 06/06/2020 05/03/2020  Transportation Screening Complete Complete  Medication Review Press photographer) Complete Complete  PCP or Specialist appointment within 3-5 days of discharge Complete Complete  HRI or Home Care Consult Complete -  SW Recovery Care/Counseling Consult - Complete  Palliative Care Screening Not Applicable Not [place] Not Applicable Not Applicable  Some recent data might be hidden

## 2020-08-15 ENCOUNTER — Other Ambulatory Visit: Payer: Medicare HMO

## 2020-08-15 DIAGNOSIS — I5033 Acute on chronic diastolic (congestive) heart failure: Secondary | ICD-10-CM | POA: Diagnosis not present

## 2020-08-15 DIAGNOSIS — M4624 Osteomyelitis of vertebra, thoracic region: Secondary | ICD-10-CM

## 2020-08-15 DIAGNOSIS — I501 Left ventricular failure: Secondary | ICD-10-CM | POA: Diagnosis not present

## 2020-08-15 DIAGNOSIS — N186 End stage renal disease: Secondary | ICD-10-CM | POA: Diagnosis not present

## 2020-08-15 DIAGNOSIS — Z992 Dependence on renal dialysis: Secondary | ICD-10-CM | POA: Diagnosis not present

## 2020-08-15 DIAGNOSIS — M4644 Discitis, unspecified, thoracic region: Secondary | ICD-10-CM | POA: Diagnosis not present

## 2020-08-15 LAB — CBC
HCT: 24.6 % — ABNORMAL LOW (ref 36.0–46.0)
Hemoglobin: 8 g/dL — ABNORMAL LOW (ref 12.0–15.0)
MCH: 28.3 pg (ref 26.0–34.0)
MCHC: 32.5 g/dL (ref 30.0–36.0)
MCV: 86.9 fL (ref 80.0–100.0)
Platelets: 114 10*3/uL — ABNORMAL LOW (ref 150–400)
RBC: 2.83 MIL/uL — ABNORMAL LOW (ref 3.87–5.11)
RDW: 18 % — ABNORMAL HIGH (ref 11.5–15.5)
WBC: 4.2 10*3/uL (ref 4.0–10.5)
nRBC: 0 % (ref 0.0–0.2)

## 2020-08-15 LAB — RENAL FUNCTION PANEL
Albumin: 2.4 g/dL — ABNORMAL LOW (ref 3.5–5.0)
Anion gap: 10 (ref 5–15)
BUN: 8 mg/dL (ref 8–23)
CO2: 31 mmol/L (ref 22–32)
Calcium: 8 mg/dL — ABNORMAL LOW (ref 8.9–10.3)
Chloride: 93 mmol/L — ABNORMAL LOW (ref 98–111)
Creatinine, Ser: 2.65 mg/dL — ABNORMAL HIGH (ref 0.44–1.00)
GFR, Estimated: 20 mL/min — ABNORMAL LOW (ref >60)
Glucose, Bld: 97 mg/dL (ref 70–99)
Phosphorus: 1.5 mg/dL — ABNORMAL LOW (ref 2.5–4.6)
Potassium: 3.3 mmol/L — ABNORMAL LOW (ref 3.5–5.1)
Sodium: 134 mmol/L — ABNORMAL LOW (ref 135–145)

## 2020-08-15 LAB — GLUCOSE, CAPILLARY
Glucose-Capillary: 100 mg/dL — ABNORMAL HIGH (ref 70–99)
Glucose-Capillary: 104 mg/dL — ABNORMAL HIGH (ref 70–99)
Glucose-Capillary: 105 mg/dL — ABNORMAL HIGH (ref 70–99)
Glucose-Capillary: 106 mg/dL — ABNORMAL HIGH (ref 70–99)
Glucose-Capillary: 60 mg/dL — ABNORMAL LOW (ref 70–99)
Glucose-Capillary: 65 mg/dL — ABNORMAL LOW (ref 70–99)
Glucose-Capillary: 89 mg/dL (ref 70–99)

## 2020-08-15 LAB — C-REACTIVE PROTEIN: CRP: 1.3 mg/dL — ABNORMAL HIGH (ref <1.0)

## 2020-08-15 MED ORDER — VANCOMYCIN HCL IN DEXTROSE 1-5 GM/200ML-% IV SOLN
1000.0000 mg | INTRAVENOUS | Status: DC
  Filled 2020-08-15: qty 200

## 2020-08-15 MED ORDER — POTASSIUM CHLORIDE CRYS ER 20 MEQ PO TBCR
40.0000 meq | EXTENDED_RELEASE_TABLET | Freq: Once | ORAL | Status: AC
  Administered 2020-08-15: 40 meq via ORAL
  Filled 2020-08-15: qty 2

## 2020-08-15 MED ORDER — VANCOMYCIN HCL 1500 MG/300ML IV SOLN
1500.0000 mg | Freq: Once | INTRAVENOUS | Status: AC
  Administered 2020-08-15: 1500 mg via INTRAVENOUS
  Filled 2020-08-15: qty 300

## 2020-08-15 MED ORDER — SODIUM CHLORIDE 0.9 % IV SOLN
2.0000 g | Freq: Once | INTRAVENOUS | Status: AC
  Administered 2020-08-15: 2 g via INTRAVENOUS
  Filled 2020-08-15: qty 2

## 2020-08-15 MED ORDER — SODIUM CHLORIDE 0.9 % IV SOLN
1.0000 g | INTRAVENOUS | Status: DC
  Filled 2020-08-15: qty 1

## 2020-08-15 NOTE — Treatment Plan (Signed)
Diagnosis: Thoracic vertebral osteomyelitis with disciits  ESRD on dialysis  Culture Result: Negative  Allergies  Allergen Reactions  . Ace Inhibitors Swelling and Anaphylaxis  . Ativan [Lorazepam] Other (See Comments)    Reaction:Hallucinations and headaches  . Compazine [Prochlorperazine Edisylate] Anaphylaxis, Nausea And Vomiting and Other (See Comments)    Other reaction(s): dystonia from this vs. Reglan, 23 Jul - patient relates that she takes promethazine frequently with no problems  . Sumatriptan Succinate Other (See Comments)    Other reaction(s): delirium and hallucinations per [facility] records  . Zofran [Ondansetron] Nausea And Vomiting    Per pt. she is allergic to zofran or will experience adverse reaction like hallucination   . Losartan Nausea Only  . Prochlorperazine Other (See Comments)    Reaction:  Unknown . Patient does not remember reaction but she does have vertigo and anxiety along with n and v at times. Could be used to treat any of these   . Reglan [Metoclopramide] Other (See Comments)    Per patient her Dr. Evelina Bucy her off it   . Scopolamine Other (See Comments)    Dizziness, also has vertigo already  . Tape Rash    Plastic tape causes rash  . Tapentadol Rash    OPAT Orders Discharge antibiotics: Vancomycin 1000 mg on M-W-F during dialysys Ceftazidime 1 gram on M-W-F during dialysis Until 10/10/20   Labs weekly while on IV antibiotics: _X_ CBC with differential  _X_ CMP  _X_ ESR _X_ Vancomycin trough  Fax weekly labs to [facility]  [phone]  Clinic Follow Up Appt: 5 weeks 09/27/20 at 9.45 Am  Call [phone] with any questions

## 2020-08-15 NOTE — Progress Notes (Signed)
Pharmacy Antibiotic Note  [name] is a 59 y.o. female admitted on 08/10/2020 with vertebral osteomyelitis.  Pharmacy has been consulted for vancomycin and ceftazidime dosing. MRI reveals possible T7-8 osteomyelitis, she is s/p aspiration by IR.  She is on HD MWF  Afebrile WBC WNL Blood culture: NGTD Disc Aspiration: NGTD (no organisms or WBC seen)   Plan:  Vancomycin 1500mg  IV x 1 then 1gm IV qHD on MWF  Check pre-HD level prior to 4-5th dose  Ceftazidime 1gm IV qHD (MWF)  Height: 5\' 5"  (165.1 cm) Weight: 80.1 kg (176 lb 9.4 oz) IBW/kg (Calculated) : 57  Temp (24hrs), Avg:97.8 F (36.6 C), Min:97.6 F (36.4 C), Max:98.1 F (36.7 C)  Recent Labs  Lab 08/10/20 1120 08/11/20 0110 08/12/20 0425 08/13/20 1711 08/15/20 0900  WBC 4.3 3.5* 4.0 6.2 4.2  CREATININE 3.74* 3.06* 4.28* 5.64*  5.62* 2.65*    Estimated Creatinine Clearance: 23.3 mL/min (A) (by C-G formula based on SCr of 2.65 mg/dL (H)).    Allergies  Allergen Reactions  . Ace Inhibitors Swelling and Anaphylaxis  . Ativan [Lorazepam] Other (See Comments)    Reaction:Hallucinations and headaches  . Compazine [Prochlorperazine Edisylate] Anaphylaxis, Nausea And Vomiting and Other (See Comments)    Other reaction(s): dystonia from this vs. Reglan, 23 Jul - patient relates that she takes promethazine frequently with no problems  . Sumatriptan Succinate Other (See Comments)    Other reaction(s): delirium and hallucinations per [facility] records  . Zofran [Ondansetron] Nausea And Vomiting    Per pt. she is allergic to zofran or will experience adverse reaction like hallucination   . Losartan Nausea Only  . Prochlorperazine Other (See Comments)    Reaction:  Unknown . Patient does not remember reaction but she does have vertigo and anxiety along with n and v at times. Could be used to treat any of these   . Reglan [Metoclopramide] Other (See Comments)    Per patient her Dr. Evelina Bucy her off it   . Scopolamine Other  (See Comments)    Dizziness, also has vertigo already  . Tape Rash    Plastic tape causes rash  . Tapentadol Rash     Thank you for allowing pharmacy to be a part of this patient's care.  Doreene Eland, PharmD, BCPS.   Work Cell: [phone] 08/15/2020 2:03 PM

## 2020-08-15 NOTE — Evaluation (Signed)
Physical Therapy Evaluation Patient Details Name: [name] MRN: [id number] DOB: 11/02/1960 Today's Date: 08/15/2020   History of Present Illness  Pt is a 59yo F admitted to [facility] on 08/10/20 with c/o chest pain, SOB, and BLE swelling and pain secondary to acute pulmonary edema. Pt with significant PMH including: ESRD on HD (MWF), pancreatic insufficiency, chronic essential HTN, PVD, GERD, T2DM, anemia of CKD, and COPD. Pt with multiple hospital admissions this year due to hemorrhagic shock and infected L AV fistula graft with associated swelling/pain. Pt s/p fluoroscopic guided T7-8 disc lavage aspiration on 08/13/20. Imaging significant for: volume overload/CHF, possible development of osteomyelitis with paravertebral abscess/phlegmon at T7-8, cardiomegaly, and bil pleural effusions.    Clinical Impression  Pt is a 59 year old F admitted to hospital on 08/10/20 for acute pulmonary edema. At baseline, pt was mod I with all ADL's, IADL's, limited community ambulation with AD, and driving. Pt performed activity pacing/modification to perform ADL's/IADL's and would call transportation when she did not feel well enough to drive to HD. Pt presents with generalized deconditioning, increased pain levels, and decreased standing balance without AD. Due to deficits, pt required mod I for bed mobility, supervision for transfers, and supervision for short distance ambulation. Pt with improved standing balance with AD, and was educated regarding benefits of using AD for improved energy conservation and balance; she verbalized understanding. Pt with episode of emesis post session; RN notified. Deficits limit the pt's ability to safely and independently perform ADL's, transfer, and ambulate. Pt will benefit from acute skilled PT services to address deficits for return to baseline function. Pt notes that she will not go to SNF due to her previous experience there earlier this year. Due to good safety awareness and  insight into deficits, PT recommends DC home with intermittent supervision and HHPT to address deficits for improved safety with functional mobility.     Follow Up Recommendations Home health PT;Supervision - Intermittent    Equipment Recommendations  None recommended by PT    Recommendations for Other Services OT consult     Precautions / Restrictions Precautions Precautions: Fall Restrictions Weight Bearing Restrictions: No      Mobility  Bed Mobility Overal bed mobility: Modified Independent             General bed mobility comments: Mod I to perform supine<>sit transfer with increased reliance of handrail and HOB elevated. Pt required increased time/effort to perform mobility.    Transfers Overall transfer level: Needs assistance Equipment used: Rolling walker (2 wheeled) Transfers: Sit to/from Stand;Anterior-Posterior Transfer Sit to Stand: Supervision;From elevated surface     Anterior-Posterior transfers: Supervision   General transfer comment: Supervision for safety to scoot anteriorly towards EOB with use of BUE for support. Supervision for safety to perform STS from EOB x1 with RW and from recliner x1 without AD. Pt required increased time/effort to perform STS from recliner with use of BUE support on arm rest and bed rail.  Ambulation/Gait Ambulation/Gait assistance: Supervision Gait Distance (Feet): 20 Feet (4ft x2) Assistive device: Rolling walker (2 wheeled);None   Gait velocity: decreased   General Gait Details: Pt required supervision for safety to ambulate short distance with and without RW. Pt initiated gait with RW, but quickly pushed it to the side and used bed rails to ambulate to opposite side of bed. Pt demonstrated slowed cadence, reciprocal gait pattern, forward flexed posture, and increased UE reliance on bed rails.  Stairs  Wheelchair Mobility    Modified Rankin (Stroke Patients Only)       Balance Overall balance  assessment: Needs assistance Sitting-balance support: Feet supported Sitting balance-Leahy Scale: Good Sitting balance - Comments: Good seated balance with feet supported     Standing balance-Leahy Scale: Fair Standing balance comment: Good standing balance with UE support on RW. Fair standing balance without AD and use of UE on bedrail.                             Pertinent Vitals/Pain Pain Assessment: Faces Faces Pain Scale: Hurts even more Pain Location: between shoulder blades and in BLE post ambulation Pain Descriptors / Indicators: Aching;Grimacing;Moaning Pain Intervention(s): Limited activity within patient's tolerance;Monitored during session;Repositioned    Home Living Family/patient expects to be discharged to:: Private residence Living Arrangements: Alone Available Help at Discharge: Family;Available PRN/intermittently (Son and other family/friends available PRN; pt notes "they will come if I need them") Type of Home: Apartment Home Access: Level entry     Home Layout: One level Home Equipment: Walker - 4 wheels;Cane - single point;Cane - quad;Wheelchair - manual;Toilet riser;Walker - 2 wheels      Prior Function Level of Independence: Independent with assistive device(s)         Comments: Pt reports being mod I with ADL's, IADL's, limited community ambulation with AD, and driving to HD. Pt states that she will use an AD for limited community ambulation, but uses wall/counter tops for household ambulation due to small apt. Pt notes that she uses activity modification and pacing at home as needed, and that she will take transportation to HD if she does not feel well enough to drive.     Hand Dominance   Dominant Hand: Left    Extremity/Trunk Assessment   Upper Extremity Assessment Upper Extremity Assessment: Overall WFL for tasks assessed    Lower Extremity Assessment Lower Extremity Assessment: Overall WFL for tasks assessed (Pt reports  peripheral neuropathy in BLE)    Cervical / Trunk Assessment Cervical / Trunk Assessment: Normal  Communication   Communication: No difficulties  Cognition Arousal/Alertness: Awake/alert Behavior During Therapy: WFL for tasks assessed/performed Overall Cognitive Status: Within Functional Limits for tasks assessed                                        General Comments General comments (skin integrity, edema, etc.): +1 BLE non-pitting edema with associated tightness due to increased swelling    Exercises Other Exercises Other Exercises: Pt educated regarding safety with mobility, activity modification/pacing, benefits of using AD for energy conservation and balance, assistance at home for IADL's at DC for energy conservation.   Assessment/Plan    PT Assessment Patient needs continued PT services  PT Problem List Decreased strength;Decreased activity tolerance;Decreased balance       PT Treatment Interventions Gait training;Functional mobility training;Therapeutic activities;Therapeutic exercise;Balance training;Patient/family education    PT Goals (Current goals can be found in the Care Plan section)  Acute Rehab PT Goals Patient Stated Goal: to go home PT Goal Formulation: With patient Time For Goal Achievement: 08/29/20 Potential to Achieve Goals: Good    Frequency Min 2X/week   Barriers to discharge        Co-evaluation               AM-PAC PT "6 Clicks" Mobility  Outcome Measure  Help needed turning from your back to your side while in a flat bed without using bedrails?: None Help needed moving from lying on your back to sitting on the side of a flat bed without using bedrails?: None Help needed moving to and from a bed to a chair (including a wheelchair)?: A Little Help needed standing up from a chair using your arms (e.g., wheelchair or bedside chair)?: A Little Help needed to walk in hospital room?: A Little Help needed climbing 3-5 steps  with a railing? : A Lot 6 Click Score: 19    End of Session Equipment Utilized During Treatment: Gait belt Activity Tolerance: Patient tolerated treatment well;Patient limited by pain;Patient limited by fatigue Patient left: in bed;with call bell/phone within reach;with bed alarm set Nurse Communication: Mobility status (emesis at end of session) PT Visit Diagnosis: Unsteadiness on feet (R26.81)    Time: 7416-3845 PT Time Calculation (min) (ACUTE ONLY): 43 min   Charges:   PT Evaluation $PT Eval Moderate Complexity: 1 Mod PT Treatments $Therapeutic Activity: 8-22 mins        Herminio Commons, PT, DPT 12:14 PM,08/15/20

## 2020-08-15 NOTE — Progress Notes (Signed)
ID Pt seen during dialysis Was irritable and wanted to know when she could go home I told her that the previous aspiration of the disc space yielded no answer and it would be better to repeat the aspiration to increase the yield I spoke to her son as well and he talked to her Pt refused any further intervention eventhough IR was willing to do a repeat aspiration  Objective Patient Vitals for the past 24 hrs:  BP Temp Temp src Pulse Resp SpO2 Weight  08/15/20 2009 (!) 138/50 98.5 F (36.9 C) Oral 64 16 99 % --  08/15/20 1937 (!) 129/50 98.1 F (36.7 C) Oral 66 17 97 % --  08/15/20 1615 135/61 98.1 F (36.7 C) Oral 67 15 98 % --  08/15/20 1330 (!) 142/57 -- -- 62 16 99 % --  08/15/20 1315 135/61 -- -- (!) 58 18 96 % --  08/15/20 1300 (!) 149/62 -- -- (!) 59 15 96 % --  08/15/20 1245 (!) 129/58 -- -- (!) 58 13 97 % --  08/15/20 1230 (!) 141/56 -- -- (!) 58 19 96 % --  08/15/20 1215 (!) 126/52 -- -- (!) 56 12 97 % --  08/15/20 1200 (!) 138/58 -- -- (!) 59 11 99 % --  08/15/20 1145 (!) 133/56 -- -- (!) 59 (!) 8 94 % --  08/15/20 1130 (!) 127/55 -- -- (!) 58 11 96 % --  08/15/20 1115 (!) 123/56 -- -- (!) 58 11 95 % --  08/15/20 1100 (!) 129/54 -- -- (!) 59 13 97 % --  08/15/20 1045 (!) 134/51 -- -- (!) 58 11 96 % --  08/15/20 1030 (!) 139/55 -- -- 60 17 96 % --  08/15/20 1015 132/61 -- -- 62 17 98 % --  08/15/20 0515 (!) 124/58 97.6 F (36.4 C) Axillary 61 16 100 % --  08/15/20 0500 -- -- -- -- -- -- 80.1 kg  08/15/20 0106 (!) 149/69 98.1 F (36.7 C) Oral 63 16 95 % --   Pale, no distress Examination limited as patient not cooperative Chest b/l air entry crepts bases Dependent edema of the back [id number] abd soft Left IJ catheter  Labs CBC Latest Ref Rng & Units 08/15/2020 08/13/2020 08/12/2020  WBC 4.0 - 10.5 K/uL 4.2 6.2 4.0  Hemoglobin 12.0 - 15.0 g/dL 8.0(L) 8.8(L) 6.9(L)  Hematocrit 36 - 46 % 24.6(L) 27.5(L) 22.1(L)  Platelets 150 - 400 K/uL 114(L) 161 115(L)    CMP  Latest Ref Rng & Units 08/15/2020 08/13/2020 08/13/2020  Glucose 70 - 99 mg/dL 97 65(L) 66(L)  BUN 8 - 23 mg/dL 8 16 16   Creatinine 0.44 - 1.00 mg/dL 2.65(H) 5.64(H) 5.62(H)  Sodium 135 - 145 mmol/L 134(L) 136 134(L)  Potassium 3.5 - 5.1 mmol/L 3.3(L) 4.3 4.2  Chloride 98 - 111 mmol/L 93(L) 96(L) 99  CO2 22 - 32 mmol/L 31 30 31   Calcium 8.9 - 10.3 mg/dL 8.0(L) 8.3(L) 7.8(L)  Total Protein 6.5 - 8.1 g/dL - - 6.8  Total Bilirubin 0.3 - 1.2 mg/dL - - 0.3  Alkaline Phos 38 - 126 U/L - - 78  AST 15 - 41 U/L - - 15  ALT 0 - 44 U/L - - 9   Micro 11/20 BC ng 11/22 - disc aspirate -gram stain no wbc no organism seen  Impression/recommendation Presented with SOB and chest pain  CHF/Acute pulmonary edema and acute hypoxic resp failure-  Incidental finding of Progressive sclerotic and destructive  features along the anterior T7-T8 disc space with some adjacent paravertebral soft tissue thickening highly concerning for developing osteomyelitis with paravertebral abscess/phlegmon on CT chest Underwent aspiration of disc space and 1 cc of bloody fluid sent for culture-no growth Discussed with IR and they were willing to repeat the aspiration Discussed with neurosurgery who said the left pleural space was in continuation with the paravertebral space and recommended aspiration as well Pt refused and her son could not convince her She opted for empiric treatment with IV vanco and ceftazidime and repeating the MRI/CT in 6 weeks   Anemia due to above and ESRD ? ESRD on dialysis  Recent enterococcus bacteremia- treated with 6 weeks of IV antibiotic TEE was negative  Recent Hero graft infection with enterococcus /p removal and got appropriate antibiotics ( 4 weeks after removal and 2 weeks before)  Discussed the management with the patient and the care team

## 2020-08-15 NOTE — Progress Notes (Addendum)
[facility]  ROUNDING NOTE   Subjective:   [name] is 59 years old female, well known to our practice,presented to the ED  on 08/10/2020, with worsening SOB. She also reported chest pain, which resolved prior to  ED arrival. SpO2 was 83% on arrival, currently 100% on 3L O2 via nasal cannula.Patient has h/o ESRD, on dialysis MWF.  Patient in dialysis today, resting in bed, receiving dialysis treatment.  She complains of vomiting and diarrhea, reports 'feeling tired'.   HEMODIALYSIS FLOWSHEET:  Blood Flow Rate (mL/min): 300 mL/min Arterial Pressure (mmHg): -100 mmHg Venous Pressure (mmHg): 190 mmHg Transmembrane Pressure (mmHg): 80 mmHg Ultrafiltration Rate (mL/min): 660 mL/min Dialysate Flow Rate (mL/min): 600 ml/min Conductivity: Machine : 13.8 Conductivity: Machine : 13.8 Dialysis Fluid Bolus: Normal Saline Bolus Amount (mL): 300 mL  Objective:  Vital signs in last 24 hours:  Temp:  [97.6 F (36.4 C)-98.1 F (36.7 C)] 97.6 F (36.4 C) (11/24 0515) Pulse Rate:  [56-66] 62 (11/24 1330) Resp:  [8-20] 16 (11/24 1330) BP: (123-149)/(51-69) 142/57 (11/24 1330) SpO2:  [94 %-100 %] 99 % (11/24 1330) Weight:  [80.1 kg] 80.1 kg (11/24 0500)  Weight change: -2.2 kg Filed Weights   08/13/20 1252 08/14/20 0428 08/15/20 0500  Weight: 82.3 kg 78.6 kg 80.1 kg    Intake/Output: I/O last 3 completed shifts: In: 0  Out: 2000 [Other:2000]   Intake/Output this shift:  Total I/O In: -  Out: 1500 [Other:1500]  Physical Exam: General:  Sleeping, arousable to call, receiving dialysis treatment  Head: Normocephalic,atraumatic  Eyes:  Anicteric  Lungs:   Normal and symmetrical respiratory effort, lungs clear  Heart:  S1-S2, no rubs or gallops  Abdomen:  Soft, nontender, nondistended  Extremities:  1+ peripheral edema   Neurologic:  Awake, speech clear and appropriate, moving all four extremities  Skin: No acute lesions or rashes  Access: Lt IJ catheter    Basic  Metabolic Panel: Recent Labs  Lab 08/10/20 1120 08/10/20 1120 08/10/20 1330 08/11/20 0110 08/11/20 0110 08/12/20 0425 08/13/20 1711 08/15/20 0900  NA 139  --   --  137  --  135 136  134* 134*  K 2.9*  --   --  3.3*  --  3.9 4.3  4.2 3.3*  CL 99  --   --  99  --  95* 96*  99 93*  CO2 28  --   --  29  --  29 30  31 31   GLUCOSE 46*  --   --  64*  --  101* 65*  66* 97  BUN 8  --   --  6*  --  10 16  16 8   CREATININE 3.74*  --   --  3.06*  --  4.28* 5.64*  5.62* 2.65*  CALCIUM 7.7*   < >  --  7.5*   < > 8.2* 8.3*  7.8* 8.0*  MG  --   --  1.8 1.8  --   --   --   --   PHOS  --   --   --  2.4*  --   --  4.0 1.5*   < > = values in this interval not displayed.    Liver Function Tests: Recent Labs  Lab 08/10/20 1120 08/12/20 0425 08/13/20 1711 08/15/20 0900  AST 17 15 15   --   ALT 10 9 9   --   ALKPHOS 68 70 78  --   BILITOT 0.5 0.5 0.3  --  PROT 6.1* 6.0* 6.8  --   ALBUMIN 2.4* 2.3* 2.5*  2.5* 2.4*   No results for input(s): LIPASE, AMYLASE in the last 168 hours. No results for input(s): AMMONIA in the last 168 hours.  CBC: Recent Labs  Lab 08/10/20 1120 08/11/20 0110 08/12/20 0425 08/13/20 1711 08/15/20 0900  WBC 4.3 3.5* 4.0 6.2 4.2  NEUTROABS  --   --  1.1* 2.4  --   HGB 7.4* 7.1* 6.9* 8.8* 8.0*  HCT 22.5* 22.1* 22.1* 27.5* 24.6*  MCV 87.9 89.1 90.2 87.9 86.9  PLT 146* 107* 115* 161 114*    Cardiac Enzymes: No results for input(s): CKTOTAL, CKMB, CKMBINDEX, TROPONINI in the last 168 hours.  BNP: Invalid input(s): POCBNP  CBG: Recent Labs  Lab 08/14/20 2111 08/15/20 0103 08/15/20 0511 08/15/20 0602 08/15/20 0843  GLUCAP 93 89 65* 106* 105*    Microbiology: Results for orders placed or performed during the hospital encounter of 08/10/20  Resp Panel by RT-PCR (Flu A&B, Covid) Nasopharyngeal Swab     Status: None   Collection Time: 08/10/20 11:20 AM   Specimen: Nasopharyngeal Swab; Nasopharyngeal(NP) swabs in vial transport medium  Result  Value Ref Range Status   SARS Coronavirus 2 by RT PCR NEGATIVE NEGATIVE Final    Comment: (NOTE) SARS-CoV-2 target nucleic acids are NOT DETECTED.  The SARS-CoV-2 RNA is generally detectable in upper respiratory specimens during the acute phase of infection. The lowest concentration of SARS-CoV-2 viral copies this assay can detect is 138 copies/mL. A negative result does not preclude SARS-Cov-2 infection and should not be used as the sole basis for treatment or other patient management decisions. A negative result may occur with  improper specimen collection/handling, submission of specimen other than nasopharyngeal swab, presence of viral mutation(s) within the areas targeted by this assay, and inadequate number of viral copies(<138 copies/mL). A negative result must be combined with clinical observations, patient history, and epidemiological information. The expected result is Negative.  Fact Sheet for Patients:  EntrepreneurPulse.com.au  Fact Sheet for Healthcare Providers:  IncredibleEmployment.be  This test is no t yet approved or cleared by the [place] FDA and  has been authorized for detection and/or diagnosis of SARS-CoV-2 by FDA under an Emergency Use Authorization (EUA). This EUA will remain  in effect (meaning this test can be used) for the duration of the COVID-19 declaration under Section 564(b)(1) of the Act, 21 U.S.C.section 360bbb-3(b)(1), unless the authorization is terminated  or revoked sooner.       Influenza A by PCR NEGATIVE NEGATIVE Final   Influenza B by PCR NEGATIVE NEGATIVE Final    Comment: (NOTE) The Xpert Xpress SARS-CoV-2/FLU/RSV plus assay is intended as an aid in the diagnosis of influenza from Nasopharyngeal swab specimens and should not be used as a sole basis for treatment. Nasal washings and aspirates are unacceptable for Xpert Xpress SARS-CoV-2/FLU/RSV testing.  Fact Sheet for  Patients: EntrepreneurPulse.com.au  Fact Sheet for Healthcare Providers: IncredibleEmployment.be  This test is not yet approved or cleared by the [place] FDA and has been authorized for detection and/or diagnosis of SARS-CoV-2 by FDA under an Emergency Use Authorization (EUA). This EUA will remain in effect (meaning this test can be used) for the duration of the COVID-19 declaration under Section 564(b)(1) of the Act, 21 U.S.C. section 360bbb-3(b)(1), unless the authorization is terminated or revoked.  Performed at [facility], [address] [place], [place] [address]   MRSA PCR Screening     Status: None  Collection Time: 08/10/20 10:12 PM   Specimen: Nasal Mucosa; Nasopharyngeal  Result Value Ref Range Status   MRSA by PCR NEGATIVE NEGATIVE Final    Comment:        The GeneXpert MRSA Assay (FDA approved for NASAL specimens only), is one component of a comprehensive MRSA colonization surveillance program. It is not intended to diagnose MRSA infection nor to guide or monitor treatment for MRSA infections. Performed at [facility], [address] [place], [place] [address]   Culture, blood (Routine X 2) w Reflex to ID Panel     Status: None (Preliminary result)   Collection Time: 08/11/20 12:51 AM   Specimen: BLOOD  Result Value Ref Range Status   Specimen Description BLOOD BLOOD LEFT HAND  Final   Special Requests   Final    BOTTLES DRAWN AEROBIC ONLY Blood Culture results may not be optimal due to an inadequate volume of blood received in culture bottles   Culture   Final    NO GROWTH 4 DAYS Performed at [facility], [address] [place], [place] [address]    Report Status PENDING  Incomplete  Culture, blood (Routine X 2) w Reflex to ID Panel     Status: None (Preliminary result)   Collection Time: 08/11/20  1:03 AM   Specimen: BLOOD  Result Value Ref Range Status   Specimen Description  BLOOD BLOOD LEFT HAND  Final   Special Requests   Final    BOTTLES DRAWN AEROBIC AND ANAEROBIC Blood Culture results may not be optimal due to an inadequate volume of blood received in culture bottles   Culture   Final    NO GROWTH 4 DAYS Performed at [facility], [address], [place], [place] [address]    Report Status PENDING  Incomplete  Aerobic/Anaerobic Culture (surgical/deep wound)     Status: None (Preliminary result)   Collection Time: 08/13/20  2:08 PM   Specimen: Wound; Abscess  Result Value Ref Range Status   Specimen Description   Final    WOUND Performed at [facility], [address] [place], [place] [address]    Special Requests   Final    [place] Performed at [facility], [place], [place] [address]    Gram Stain NO WBC SEEN NO ORGANISMS SEEN   Final   Culture   Final    NO GROWTH 2 DAYS NO ANAEROBES ISOLATED; CULTURE IN PROGRESS FOR 5 DAYS Performed at [facility] [address], [place], [place] [address]    Report Status PENDING  Incomplete    Coagulation Studies: No results for input(s): LABPROT, INR in the last 72 hours.  Urinalysis: No results for input(s): COLORURINE, LABSPEC, PHURINE, GLUCOSEU, HGBUR, BILIRUBINUR, KETONESUR, PROTEINUR, UROBILINOGEN, NITRITE, LEUKOCYTESUR in the last 72 hours.  Invalid input(s): APPERANCEUR    Imaging: No results found.   Medications:   . sodium chloride    . [START ON 08/17/2020] cefTAZidime (FORTAZ)  IV    . cefTAZidime (FORTAZ)  IV    . [START ON 08/17/2020] vancomycin    . vancomycin     . sodium chloride   Intravenous Once  . acetaminophen  650 mg Oral Once  . amLODipine  10 mg Oral Daily  . ascorbic acid  500 mg Oral Q breakfast  . atorvastatin  20 mg Oral QPM  . carvedilol  3.125 mg Oral BID WC  . Chlorhexidine Gluconate Cloth  6 each Topical Q0600  .  cholecalciferol  1,000 Units Oral Daily  . cholestyramine light  4 g Oral BID  . epoetin  (EPOGEN/PROCRIT) injection  8,000 Units Intravenous Q M,W,F-HD  . feeding supplement  237 mL Oral Q24H  . ferrous sulfate  325 mg Oral Q breakfast  . furosemide  80 mg Oral Daily  . gabapentin  100 mg Oral QPM  . hydrALAZINE  50 mg Oral Q8H  . hydrOXYzine  25 mg Oral Daily  . lipase/protease/amylase  36,000 Units Oral TID AC  . multivitamin  1 tablet Oral Daily  . omega-3 acid ethyl esters  1 capsule Oral BID  . pantoprazole (PROTONIX) IV  40 mg Intravenous Q12H  . saccharomyces boulardii  250 mg Oral Daily  . sevelamer carbonate  800 mg Oral BID WC  . sodium chloride flush  3 mL Intravenous Q12H  . cyanocobalamin  1,000 mcg Oral Daily   sodium chloride, acetaminophen, albuterol, alum & mag hydroxide-simeth, ammonium lactate, calcium carbonate, dextromethorphan-guaiFENesin, dextrose, diphenhydrAMINE, hydrALAZINE, HYDROcodone-acetaminophen, morphine injection, mupirocin ointment, nitroGLYCERIN, promethazine, sodium chloride flush  Assessment/ Plan:  [name] is a 59 y.o.  female well known to our practice,presented to the ED, with worsening SOB. She also reported chest pain, which resolved prior to  ED arrival. SpO2 was 83% on arrival, currently 100% on 3L O2 via nasal cannula. Patient has h/o ESRD, on dialysis MWF. We will arrange dialysis for her today.Orders placed.  #ESRD on dialysis MWF Dialysis treatment today, tolerating well We will continue Monday Wednesday Friday schedule  # Hypertension Controlled with current antihypertensive regimen BP 135/61 today  #Secondary Hyperparathyroidism Lab Results  Component Value Date   PTH 357 (H) 10/05/2018   CALCIUM 8.0 (L) 08/15/2020   CAION 1.09 (L) 04/13/2020   PHOS 1.5 (L) 08/15/2020  We will continue monitoring bone mineral metabolism parameters  # Acute on chronic diastolic CHF Last 2D Echo on 06/11/2020  with EF 50 to 55% Patient continues to be on 2-3L of O2, no worsening SOB Continue p.o. furosemide  #Anemia of  Chronic Kidney Disease Lab Results  Component Value Date   HGB 8.0 (L) 08/15/2020  Epogen 8000 units with dialysis, blood transfusion during this admission Patient is on oral iron supplements   #Hypokalemia Potassium 3.3 today Will supplement with oral potassium chloride  #Hyponatremia  sodium 134 We will continue monitoring closely   LOS: 4 Ciella Obi 11/24/20212:13 PM

## 2020-08-15 NOTE — Progress Notes (Signed)
PROGRESS NOTE    [name]  ZCH:[id number] DOB: [birth year] DOA: 08/10/2020 [facility]CP: [name]   Assessment & Plan:   Principal Problem:   Acute pulmonary edema ([place]) Active Problems:   End-stage renal disease on hemodialysis ([place])   Type 2 diabetes mellitus with hypoglycemia without coma, without long-term current use of insulin (HCC)   Acute respiratory failure with hypoxia (HCC)   Acute on chronic diastolic CHF (congestive heart failure) (HCC)   Hypokalemia   Hypoglycemia   Sinus bradycardia   TIA (transient ischemic attack)   Osteomyelitis (HCC)   Benign essential HTN   Anemia in ESRD (end-stage renal disease) (HCC)   Fluid overload   HLD (hyperlipidemia)   Chest pain   CAD (coronary artery disease)   GERD (gastroesophageal reflux disease)   Discitis of thoracic region   Advanced care planning/counseling discussion  T7/T8 discitis vs osteomyelitis: thoracic spine cx NGTD.  No abxs currently as per ID. Refused repeat aspirate. Continue to hold aspirin  Acute hypoxic respiratory failure:  with pulse ox of 87% on room air on the 19th. Weaned off of supplemental oxygen. Resolved   Acute diastolic congestive heart failure: continue w/ HD for fluid removal as per nephro  ACD: s/p 1 unit of pRBCs transfused.  H&H are trending up. Will continue to monitor   Relative hypoglycemia: HbA1c 3.9. Encourage po diet  ESRD: on HD MWF. Management per nephro  Hx of renal cell cancer status: s/p cryoablation. Management per onco as an outpatient   Chronic pancreatitis: continue on creon  DVT prophylaxis: SCDs Code Status: full  Family Communication: discussed pt's care w/ pt's son, [name], and answered his questions  Disposition Plan: likely d/c back home w/ home health    Status is: Inpatient  Remains inpatient appropriate because:Ongoing diagnostic testing needed not appropriate for outpatient work up, IV treatments appropriate due to intensity of  illness or inability to take PO and Inpatient level of care appropriate due to severity of illness   Dispo: The patient is from: Home              Anticipated d/c is to: Home w/ home health               Anticipated d/c date is: 1 day              Patient currently is not medically stable to d/c.     Consultants:   ID  nephro  neurosurg   Procedures:    Antimicrobials: vanco, ceftazidine    Subjective: Pt c/o fatigue   Objective: Vitals:   08/15/20 1245 08/15/20 1300 08/15/20 1315 08/15/20 1330  BP: (!) 129/58 (!) 149/62 135/61 (!) 142/57  Pulse: (!) 58 (!) 59 (!) 58 62  Resp: 13 15 18 16   Temp:      TempSrc:      SpO2: 97% 96% 96% 99%  Weight:      Height:        Intake/Output Summary (Last 24 hours) at 08/15/2020 1419 Last data filed at 08/15/2020 1315 Gross per 24 hour  Intake -  Output 1500 ml  Net -1500 ml   Filed Weights   08/13/20 1252 08/14/20 0428 08/15/20 0500  Weight: 82.3 kg 78.6 kg 80.1 kg    Examination:  General exam: Appears calm and comfortable  Respiratory system: diminished breath sounds b/l. No wheezes Cardiovascular system: S1 & S2 +. No rubs, gallops or clicks. No pedal edema. Gastrointestinal system: Abdomen  is nondistended, soft and nontender. Hyperactive bowel sounds heard. Central nervous system: Alert and oriented. Moves all 4 extremities  Psychiatry: Judgement and insight appear normal. Flat mood and affect    Data Reviewed: I have personally reviewed following labs and imaging studies  CBC: Recent Labs  Lab 08/10/20 1120 08/11/20 0110 08/12/20 0425 08/13/20 1711 08/15/20 0900  WBC 4.3 3.5* 4.0 6.2 4.2  NEUTROABS  --   --  1.1* 2.4  --   HGB 7.4* 7.1* 6.9* 8.8* 8.0*  HCT 22.5* 22.1* 22.1* 27.5* 24.6*  MCV 87.9 89.1 90.2 87.9 86.9  PLT 146* 107* 115* 161 948*   Basic Metabolic Panel: Recent Labs  Lab 08/10/20 1120 08/10/20 1330 08/11/20 0110 08/12/20 0425 08/13/20 1711 08/15/20 0900  NA 139  --  137  135 136  134* 134*  K 2.9*  --  3.3* 3.9 4.3  4.2 3.3*  CL 99  --  99 95* 96*  99 93*  CO2 28  --  29 29 30  31 31   GLUCOSE 46*  --  64* 101* 65*  66* 97  BUN 8  --  6* 10 16  16 8   CREATININE 3.74*  --  3.06* 4.28* 5.64*  5.62* 2.65*  CALCIUM 7.7*  --  7.5* 8.2* 8.3*  7.8* 8.0*  MG  --  1.8 1.8  --   --   --   PHOS  --   --  2.4*  --  4.0 1.5*   GFR: Estimated Creatinine Clearance: 23.3 mL/min (A) (by C-G formula based on SCr of 2.65 mg/dL (H)). Liver Function Tests: Recent Labs  Lab 08/10/20 1120 08/12/20 0425 08/13/20 1711 08/15/20 0900  AST 17 15 15   --   ALT 10 9 9   --   ALKPHOS 68 70 78  --   BILITOT 0.5 0.5 0.3  --   PROT 6.1* 6.0* 6.8  --   ALBUMIN 2.4* 2.3* 2.5*  2.5* 2.4*   No results for input(s): LIPASE, AMYLASE in the last 168 hours. No results for input(s): AMMONIA in the last 168 hours. Coagulation Profile: No results for input(s): INR, PROTIME in the last 168 hours. Cardiac Enzymes: No results for input(s): CKTOTAL, CKMB, CKMBINDEX, TROPONINI in the last 168 hours. BNP (last 3 results) No results for input(s): PROBNP in the last 8760 hours. HbA1C: No results for input(s): HGBA1C in the last 72 hours. CBG: Recent Labs  Lab 08/14/20 2111 08/15/20 0103 08/15/20 0511 08/15/20 0602 08/15/20 0843  GLUCAP 93 89 65* 106* 105*   Lipid Profile: No results for input(s): CHOL, HDL, LDLCALC, TRIG, CHOLHDL, LDLDIRECT in the last 72 hours. Thyroid Function Tests: No results for input(s): TSH, T4TOTAL, FREET4, T3FREE, THYROIDAB in the last 72 hours. Anemia Panel: No results for input(s): VITAMINB12, FOLATE, FERRITIN, TIBC, IRON, RETICCTPCT in the last 72 hours. Sepsis Labs: No results for input(s): PROCALCITON, LATICACIDVEN in the last 168 hours.  Recent Results (from the past 240 hour(s))  Resp Panel by RT-PCR (Flu A&B, Covid) Nasopharyngeal Swab     Status: None   Collection Time: 08/10/20 11:20 AM   Specimen: Nasopharyngeal Swab;  Nasopharyngeal(NP) swabs in vial transport medium  Result Value Ref Range Status   SARS Coronavirus 2 by RT PCR NEGATIVE NEGATIVE Final    Comment: (NOTE) SARS-CoV-2 target nucleic acids are NOT DETECTED.  The SARS-CoV-2 RNA is generally detectable in upper respiratory specimens during the acute phase of infection. The lowest concentration of SARS-CoV-2 viral copies this assay  can detect is 138 copies/mL. A negative result does not preclude SARS-Cov-2 infection and should not be used as the sole basis for treatment or other patient management decisions. A negative result may occur with  improper specimen collection/handling, submission of specimen other than nasopharyngeal swab, presence of viral mutation(s) within the areas targeted by this assay, and inadequate number of viral copies(<138 copies/mL). A negative result must be combined with clinical observations, patient history, and epidemiological information. The expected result is Negative.  Fact Sheet for Patients:  EntrepreneurPulse.com.au  Fact Sheet for Healthcare Providers:  IncredibleEmployment.be  This test is no t yet approved or cleared by the [place] FDA and  has been authorized for detection and/or diagnosis of SARS-CoV-2 by FDA under an Emergency Use Authorization (EUA). This EUA will remain  in effect (meaning this test can be used) for the duration of the COVID-19 declaration under Section 564(b)(1) of the Act, 21 U.S.C.section 360bbb-3(b)(1), unless the authorization is terminated  or revoked sooner.       Influenza A by PCR NEGATIVE NEGATIVE Final   Influenza B by PCR NEGATIVE NEGATIVE Final    Comment: (NOTE) The Xpert Xpress SARS-CoV-2/FLU/RSV plus assay is intended as an aid in the diagnosis of influenza from Nasopharyngeal swab specimens and should not be used as a sole basis for treatment. Nasal washings and aspirates are unacceptable for Xpert Xpress  SARS-CoV-2/FLU/RSV testing.  Fact Sheet for Patients: EntrepreneurPulse.com.au  Fact Sheet for Healthcare Providers: IncredibleEmployment.be  This test is not yet approved or cleared by the [place] FDA and has been authorized for detection and/or diagnosis of SARS-CoV-2 by FDA under an Emergency Use Authorization (EUA). This EUA will remain in effect (meaning this test can be used) for the duration of the COVID-19 declaration under Section 564(b)(1) of the Act, 21 U.S.C. section 360bbb-3(b)(1), unless the authorization is terminated or revoked.  Performed at [facility], [address] [place], [place] [address]   MRSA PCR Screening     Status: None   Collection Time: 08/10/20 10:12 PM   Specimen: Nasal Mucosa; Nasopharyngeal  Result Value Ref Range Status   MRSA by PCR NEGATIVE NEGATIVE Final    Comment:        The GeneXpert MRSA Assay (FDA approved for NASAL specimens only), is one component of a comprehensive MRSA colonization surveillance program. It is not intended to diagnose MRSA infection nor to guide or monitor treatment for MRSA infections. Performed at [facility], [address] [place], [place] [address]   Culture, blood (Routine X 2) w Reflex to ID Panel     Status: None (Preliminary result)   Collection Time: 08/11/20 12:51 AM   Specimen: BLOOD  Result Value Ref Range Status   Specimen Description BLOOD BLOOD LEFT HAND  Final   Special Requests   Final    BOTTLES DRAWN AEROBIC ONLY Blood Culture results may not be optimal due to an inadequate volume of blood received in culture bottles   Culture   Final    NO GROWTH 4 DAYS Performed at [facility], [address] [place], [place] [address]    Report Status PENDING  Incomplete  Culture, blood (Routine X 2) w Reflex to ID Panel     Status: None (Preliminary result)   Collection Time: 08/11/20  1:03 AM   Specimen: BLOOD  Result  Value Ref Range Status   Specimen Description BLOOD BLOOD LEFT HAND  Final   Special Requests   Final  BOTTLES DRAWN AEROBIC AND ANAEROBIC Blood Culture results may not be optimal due to an inadequate volume of blood received in culture bottles   Culture   Final    NO GROWTH 4 DAYS Performed at [facility], [address] [place], [place] [address]    Report Status PENDING  Incomplete  Aerobic/Anaerobic Culture (surgical/deep wound)     Status: None (Preliminary result)   Collection Time: 08/13/20  2:08 PM   Specimen: Wound; Abscess  Result Value Ref Range Status   Specimen Description   Final    WOUND Performed at [facility], [address], [place], [place] [address]    Special Requests   Final    [place] Performed at [facility], [place], [place] [address]    Gram Stain NO WBC SEEN NO ORGANISMS SEEN   Final   Culture   Final    NO GROWTH 2 DAYS NO ANAEROBES ISOLATED; CULTURE IN PROGRESS FOR 5 DAYS Performed at [facility] [address], [place],  [address]    Report Status PENDING  Incomplete         Radiology Studies: No results found.      Scheduled Meds: . sodium chloride   Intravenous Once  . acetaminophen  650 mg Oral Once  . amLODipine  10 mg Oral Daily  . ascorbic acid  500 mg Oral Q breakfast  . atorvastatin  20 mg Oral QPM  . carvedilol  3.125 mg Oral BID WC  . Chlorhexidine Gluconate Cloth  6 each Topical Q0600  . cholecalciferol  1,000 Units Oral Daily  . cholestyramine light  4 g Oral BID  . epoetin (EPOGEN/PROCRIT) injection  8,000 Units Intravenous Q M,W,F-HD  . feeding supplement  237 mL Oral Q24H  . ferrous sulfate  325 mg Oral Q breakfast  . furosemide  80 mg Oral Daily  . gabapentin  100 mg Oral QPM  . hydrALAZINE  50 mg Oral Q8H  . hydrOXYzine  25 mg Oral Daily  . lipase/protease/amylase  36,000 Units Oral TID AC  . multivitamin  1 tablet Oral Daily  . omega-3 acid  ethyl esters  1 capsule Oral BID  . pantoprazole (PROTONIX) IV  40 mg Intravenous Q12H  . saccharomyces boulardii  250 mg Oral Daily  . sevelamer carbonate  800 mg Oral BID WC  . sodium chloride flush  3 mL Intravenous Q12H  . cyanocobalamin  1,000 mcg Oral Daily   Continuous Infusions: . sodium chloride    . [START ON 08/17/2020] cefTAZidime (FORTAZ)  IV    . cefTAZidime (FORTAZ)  IV    . [START ON 08/17/2020] vancomycin    . vancomycin       LOS: 4 days    Time spent: 33  mins     Wyvonnia Dusky, MD [facility] Pager [phone]  If 7PM-7AM, please contact night-coverage 08/15/2020, 2:19 PM

## 2020-08-15 NOTE — Progress Notes (Signed)
Patient's blood sugar was 65. Given juice and patient also drank some ensure. Rechecked CBG and it increased to 106.  Will continue to monitor.  Christene Slates  08/15/2020  6:07 AM

## 2020-08-15 NOTE — Consult Note (Signed)
Referring Physician:  No referring provider defined for this encounter.  Primary Physician:  [facility] [place]  Chief Complaint:   History of Present Illness: [name] is a medically complex 59 y.o. female who is currently admitted to [facility]. She endorses a long history of back pain and bilateral lower extremity neuropathy. She has had a thoracic MRI which showed T7-T8 osteomyelitis discitis. ID has been consulted, she has had a disc aspiration on 08/13/2020 which thus far has been NGTD. Neurosurgery was consulted to determine if there were other treatment modalities that were indicated.   On exam, [name] is uncooperative in examination, stating that she was tired from dialysis. She declined participation in a physical exam but did endorse that she remains with chronic back pain and lower extremity numbness. She has been up this morning with PT and said she "walked from one side of the bed to the other".  I was able to obtain b/l hip flexion and dorsi/plantarflexion exams from her, and her strength was 5/5 to those muscle groups.   The symptoms are causing a significant impact on the patient's life.   Review of Systems:  A 10 point review of systems is negative, except for the pertinent positives and negatives detailed in the HPI.  Past Medical History: Past Medical History:  Diagnosis Date  . Anemia   . Anginal pain ([place])   . Anxiety   . Arthritis   . Asthma   . Broken wrist   . Bronchitis   . chronic diastolic CHF 2/37/6283  . Chronic kidney disease   . COPD (chronic obstructive pulmonary disease) ([place])   . Coronary artery disease    a. cath 2013: stenting to RCA (report not available); b. cath 2014: LM nl, pLAD 40%, mLAD nl, ost LCx 40%, mid LCx nl, pRCA 30% @ site of prior stent, mRCA 50%  . Depression   . Diabetes mellitus ([place])   . Diabetes mellitus without complication ([place])   . Diabetic neuropathy ([place])   . dialysis 2006  . Diverticulosis   .  Dizziness   . Dyspnea   . Elevated lipids   . Environmental and seasonal allergies   . ESRD (end stage renal disease) on dialysis ([place])    M-W-F  . Gastroparesis   . GERD (gastroesophageal reflux disease)   . Headache   . History of anemia due to chronic kidney disease   . History of hiatal hernia   . HOH (hard of hearing)   . Hx of pancreatitis 2015  . Hypertension   . Lower extremity edema   . Mitral regurgitation    a. echo 10/2013: EF 62%, noWMA, mildly dilated LA, mild to mod MR/TR, GR1DD  . Myocardial infarction ([place])   . Orthopnea   . Parathyroid abnormality ([place])   . Peripheral arterial disease ([place])   . Pneumonia   . Renal cancer ([place])   . Renal insufficiency    Pt is on dialysis on M,W + F.  . Wheezing     Past Surgical History: Past Surgical History:  Procedure Laterality Date  . A/V SHUNTOGRAM Left 01/20/2018   Procedure: A/V SHUNTOGRAM;  Surgeon: Algernon Huxley, MD;  Location: [place] [facility];  Service: Cardiovascular;  Laterality: Left;  . ABDOMINAL HYSTERECTOMY  1992  . AMPUTATION TOE Left 10/02/2017   Procedure: AMPUTATION TOE-LEFT GREAT TOE;  Surgeon: Albertine Patricia, DPM;  Location: [facility] ORS;  Service: Podiatry;  Laterality: Left;  . APPENDECTOMY    .  APPLICATION OF WOUND VAC N/A 11/25/2019   Procedure: APPLICATION OF WOUND VAC;  Surgeon: Katha Cabal, MD;  Location: [facility];  Service: Vascular;  Laterality: N/A;  . ARTERY BIOPSY Right 10/11/2018   Procedure: BIOPSY TEMPORAL ARTERY;  Surgeon: Vickie Epley, MD;  Location: [facility];  Service: General;  Laterality: Right;  . CARDIAC CATHETERIZATION Left 07/26/2015   Procedure: Left Heart Cath and Coronary Angiography;  Surgeon: Dionisio David, MD;  Location: [place] [facility];  Service: Cardiovascular;  Laterality: Left;  . CATARACT EXTRACTION W/ INTRAOCULAR LENS IMPLANT Right   . CATARACT EXTRACTION W/PHACO Left 03/10/2017   Procedure: CATARACT EXTRACTION PHACO AND INTRAOCULAR LENS PLACEMENT  (IOC);  Surgeon: Birder Robson, MD;  Location: [facility];  Service: Ophthalmology;  Laterality: Left;  [place] 00:51.9 AP% 14.2 CDE 7.39 fluid pack lot # [id number]  . CENTRAL LINE INSERTION Right 11/11/2019   Procedure: CENTRAL LINE INSERTION;  Surgeon: Katha Cabal, MD;  Location: [facility];  Service: Vascular;  Laterality: Right;  . CENTRAL LINE INSERTION  11/25/2019   Procedure: CENTRAL LINE INSERTION;  Surgeon: Katha Cabal, MD;  Location: [facility];  Service: Vascular;;  . CHOLECYSTECTOMY    . COLONOSCOPY WITH PROPOFOL N/A 08/12/2016   Procedure: COLONOSCOPY WITH PROPOFOL;  Surgeon: Lollie Sails, MD;  Location: [facility] ENDOSCOPY;  Service: Endoscopy;  Laterality: N/A;  . DIALYSIS FISTULA CREATION Left    upper arm  . dialysis grafts    . DIALYSIS/PERMA CATHETER INSERTION N/A 11/14/2019   Procedure: DIALYSIS/PERMA CATHETER INSERTION;  Surgeon: Algernon Huxley, MD;  Location: [place] [facility];  Service: Cardiovascular;  Laterality: N/A;  . DIALYSIS/PERMA CATHETER INSERTION N/A 02/03/2020   Procedure: DIALYSIS/PERMA CATHETER INSERTION;  Surgeon: Katha Cabal, MD;  Location: [place] [facility];  Service: Cardiovascular;  Laterality: N/A;  . DIALYSIS/PERMA CATHETER INSERTION N/A 06/19/2020   Procedure: DIALYSIS/PERMA CATHETER INSERTION;  Surgeon: Katha Cabal, MD;  Location: [place] [facility];  Service: Cardiovascular;  Laterality: N/A;  . DIALYSIS/PERMA CATHETER REMOVAL N/A 05/25/2020   Procedure: DIALYSIS/PERMA CATHETER REMOVAL;  Surgeon: Katha Cabal, MD;  Location: [place] [facility];  Service: Cardiovascular;  Laterality: N/A;  . ESOPHAGOGASTRODUODENOSCOPY N/A 03/08/2015   Procedure: ESOPHAGOGASTRODUODENOSCOPY (EGD);  Surgeon: Manya Silvas, MD;  Location: [facility] ENDOSCOPY;  Service: Endoscopy;  Laterality: N/A;  . ESOPHAGOGASTRODUODENOSCOPY (EGD) WITH PROPOFOL N/A 03/18/2016   Procedure: ESOPHAGOGASTRODUODENOSCOPY (EGD) WITH PROPOFOL;  Surgeon: [name],  MD;  Location: [facility] ENDOSCOPY;  Service: Endoscopy;  Laterality: N/A;  . EYE SURGERY Right 2018  . FECAL TRANSPLANT N/A 08/23/2015   Procedure: FECAL TRANSPLANT;  Surgeon: Manya Silvas, MD;  Location: [facility] ENDOSCOPY;  Service: Endoscopy;  Laterality: N/A;  . HAND SURGERY Bilateral   . HEMATOMA EVACUATION Left 11/25/2019   Procedure: EVACUATION HEMATOMA;  Surgeon: Katha Cabal, MD;  Location: [facility];  Service: Vascular;  Laterality: Left;  . I & D EXTREMITY Left 11/25/2019   Procedure: IRRIGATION AND DEBRIDEMENT EXTREMITY;  Surgeon: Katha Cabal, MD;  Location: [facility];  Service: Vascular;  Laterality: Left;  . IR FLUORO GUIDE CV LINE RIGHT  04/06/2020  . IR INJECT/THERA/INC NEEDLE/CATH/PLC EPI/CERV/THOR [facility]  08/13/2020  . IR RADIOLOGIST EVAL & MGMT  07/28/2019  . IR RADIOLOGIST EVAL & MGMT  08/11/2019  . LIGATION OF ARTERIOVENOUS  FISTULA Left 11/11/2019  . LIGATION OF ARTERIOVENOUS  FISTULA Left 11/11/2019   Procedure: LIGATION OF ARTERIOVENOUS  FISTULA;  Surgeon: Katha Cabal, MD;  Location: [facility];  Service: Vascular;  Laterality: Left;  . LIGATIONS OF HERO GRAFT Right 06/13/2020   Procedure: LIGATION / REMOVAL OF RIGHT HERO GRAFT;  Surgeon: Katha Cabal, MD;  Location: [facility];  Service: Vascular;  Laterality: Right;  . PERIPHERAL VASCULAR CATHETERIZATION N/A 12/20/2015   Procedure: Thrombectomy of dialysis access versus permcath placement;  Surgeon: Algernon Huxley, MD;  Location: [place] [facility];  Service: Cardiovascular;  Laterality: N/A;  . PERIPHERAL VASCULAR CATHETERIZATION N/A 12/20/2015   Procedure: A/V Shunt Intervention;  Surgeon: Algernon Huxley, MD;  Location: [place] [facility] LAB;  Service: Cardiovascular;  Laterality: N/A;  . PERIPHERAL VASCULAR CATHETERIZATION N/A 12/20/2015   Procedure: A/V Shuntogram/Fistulagram;  Surgeon: Algernon Huxley, MD;  Location: [place] [facility];  Service: Cardiovascular;  Laterality: N/A;  . PERIPHERAL VASCULAR  CATHETERIZATION N/A 01/02/2016   Procedure: A/V Shuntogram/Fistulagram;  Surgeon: Algernon Huxley, MD;  Location: [place] [facility];  Service: Cardiovascular;  Laterality: N/A;  . PERIPHERAL VASCULAR CATHETERIZATION N/A 01/02/2016   Procedure: A/V Shunt Intervention;  Surgeon: Algernon Huxley, MD;  Location: [place] [facility];  Service: Cardiovascular;  Laterality: N/A;  . TEE WITHOUT CARDIOVERSION N/A 06/11/2020   Procedure: TRANSESOPHAGEAL ECHOCARDIOGRAM (TEE);  Surgeon: Teodoro Spray, MD;  Location: [facility];  Service: Cardiovascular;  Laterality: N/A;  . UPPER EXTREMITY VENOGRAPHY Right 01/18/2020   Procedure: UPPER EXTREMITY VENOGRAPHY;  Surgeon: Katha Cabal, MD;  Location: [place] [facility];  Service: Cardiovascular;  Laterality: Right;  Marland Kitchen VASCULAR ACCESS DEVICE INSERTION Right 04/13/2020   Procedure: INSERTION OF HERO VASCULAR ACCESS DEVICE (GRAFT);  Surgeon: Katha Cabal, MD;  Location: [facility];  Service: Vascular;  Laterality: Right;    Allergies: Allergies as of 08/10/2020 - Review Complete 08/10/2020  Allergen Reaction Noted  . Ace inhibitors Swelling and Anaphylaxis 04/25/2014  . Ativan [lorazepam] Other (See Comments) 04/10/2015  . Compazine [prochlorperazine edisylate] Anaphylaxis, Nausea And Vomiting, and Other (See Comments) 09/12/2009  . Sumatriptan succinate Other (See Comments) 09/12/2009  . Zofran [ondansetron] Nausea And Vomiting 11/11/2019  . Losartan Nausea Only 03/06/2015  . Prochlorperazine Other (See Comments) 08/22/2015  . Reglan [metoclopramide] Other (See Comments) 04/22/2016  . Scopolamine Other (See Comments) 05/21/2015  . Tape Rash 02/08/2015  . Tapentadol Rash 02/08/2015    Medications:  Current Facility-Administered Medications:  .  0.9 %  sodium chloride infusion (Manually program via Guardrails IV Fluids), , Intravenous, Once, Wieting, Richard, MD .  0.9 %  sodium chloride infusion, 250 mL, Intravenous, PRN, Ivor Costa, MD .   acetaminophen (TYLENOL) tablet 650 mg, 650 mg, Oral, Q6H PRN, Ivor Costa, MD .  acetaminophen (TYLENOL) tablet 650 mg, 650 mg, Oral, Once, Wieting, Richard, MD .  albuterol (PROVENTIL) (2.5 MG/3ML) 0.083% nebulizer solution 2.5 mg, 2.5 mg, Inhalation, Q4H PRN, Ivor Costa, MD .  alum & mag hydroxide-simeth (MAALOX/MYLANTA) 200-200-20 MG/5ML suspension 15 mL, 15 mL, Oral, Q6H PRN, Ivor Costa, MD, 15 mL at 08/14/20 1611 .  amLODipine (NORVASC) tablet 10 mg, 10 mg, Oral, Daily, Ivor Costa, MD, 10 mg at 08/14/20 2139 .  ammonium lactate (LAC-HYDRIN) 12 % lotion 1 application, 1 application, Topical, BID PRN, Ivor Costa, MD .  ascorbic acid (VITAMIN C) tablet 500 mg, 500 mg, Oral, Q breakfast, Ivor Costa, MD, 500 mg at 08/14/20 0809 .  atorvastatin (LIPITOR) tablet 20 mg, 20 mg, Oral, QPM, Ivor Costa, MD, 20 mg at 08/14/20 1653 .  calcium carbonate (TUMS - dosed in mg  elemental calcium) chewable tablet 500 mg of elemental calcium, 500 mg of elemental calcium, Oral, Q6H PRN, Ivor Costa, MD, 500 mg of elemental calcium at 08/14/20 1201 .  carvedilol (COREG) tablet 3.125 mg, 3.125 mg, Oral, BID WC, Samtani, Jai-Gurmukh, MD, 3.125 mg at 08/14/20 1611 .  cefTAZidime (FORTAZ) 2 g in sodium chloride 0.9 % 100 mL IVPB, 2 g, Intravenous, Once, Berton Mount, RPH .  Chlorhexidine Gluconate Cloth 2 % PADS 6 each, 6 each, Topical, Q0600, Ivor Costa, MD, 6 each at 08/14/20 0420 .  cholecalciferol (VITAMIN D) tablet 1,000 Units, 1,000 Units, Oral, Daily, Ivor Costa, MD, 1,000 Units at 08/14/20 0809 .  cholestyramine light (PREVALITE) packet 4 g, 4 g, Oral, BID, Ivor Costa, MD, 4 g at 08/14/20 0810 .  dextromethorphan-guaiFENesin ([place] DM) 30-600 MG per 12 hr tablet 1 tablet, 1 tablet, Oral, BID PRN, Ivor Costa, MD .  dextrose 50 % solution 50 mL, 50 mL, Intravenous, PRN, Ivor Costa, MD .  diphenhydrAMINE (BENADRYL) tablet 25 mg, 25 mg, Oral, Q6H PRN, Ivor Costa, MD, 25 mg at 08/14/20 2139 .  epoetin alfa  (EPOGEN) injection 8,000 Units, 8,000 Units, Intravenous, Q M,W,F-HD, Liana Gerold, MD, 8,000 Units at 08/15/20 1327 .  feeding supplement (ENSURE ENLIVE / ENSURE PLUS) liquid 237 mL, 237 mL, Oral, Q24H, Ivor Costa, MD, 237 mL at 08/14/20 1653 .  ferrous sulfate tablet 325 mg, 325 mg, Oral, Q breakfast, Ivor Costa, MD, 325 mg at 08/14/20 0809 .  furosemide (LASIX) tablet 80 mg, 80 mg, Oral, Daily, Ivor Costa, MD, 80 mg at 08/14/20 0813 .  gabapentin (NEURONTIN) capsule 100 mg, 100 mg, Oral, QPM, Ivor Costa, MD, 100 mg at 08/14/20 1653 .  hydrALAZINE (APRESOLINE) injection 5 mg, 5 mg, Intravenous, Q2H PRN, Ivor Costa, MD .  hydrALAZINE (APRESOLINE) tablet 50 mg, 50 mg, Oral, Q8H, Ivor Costa, MD, 50 mg at 08/15/20 0522 .  HYDROcodone-acetaminophen (NORCO/VICODIN) 5-325 MG per tablet 1 tablet, 1 tablet, Oral, Q6H PRN, Loletha Grayer, MD, 1 tablet at 08/14/20 1611 .  hydrOXYzine (ATARAX/VISTARIL) tablet 25 mg, 25 mg, Oral, Daily, Ivor Costa, MD, 25 mg at 08/14/20 0809 .  lipase/protease/amylase (CREON) capsule 36,000 Units, 36,000 Units, Oral, TID Renella Cunas, MD, 36,000 Units at 08/14/20 1611 .  morphine 2 MG/ML injection 2 mg, 2 mg, Intravenous, Q3H PRN, Ivor Costa, MD, 2 mg at 08/15/20 1021 .  multivitamin (RENA-VIT) tablet 1 tablet, 1 tablet, Oral, Daily, Ivor Costa, MD, 1 tablet at 08/14/20 0809 .  mupirocin ointment (BACTROBAN) 2 % 1 application, 1 application, Topical, Daily PRN, Ivor Costa, MD .  nitroGLYCERIN (NITROSTAT) SL tablet 0.4 mg, 0.4 mg, Sublingual, Q5 min PRN, Ivor Costa, MD .  omega-3 acid ethyl esters (LOVAZA) capsule 1 g, 1 capsule, Oral, BID, Ivor Costa, MD, 1 g at 08/14/20 1653 .  pantoprazole (PROTONIX) injection 40 mg, 40 mg, Intravenous, Q12H, Loletha Grayer, MD, 40 mg at 08/15/20 0848 .  promethazine (PHENERGAN) tablet 25 mg, 25 mg, Oral, Q6H PRN, Ivor Costa, MD, 25 mg at 08/15/20 0848 .  saccharomyces boulardii (FLORASTOR) capsule 250 mg, 250 mg, Oral, Daily,  Ivor Costa, MD, 250 mg at 08/14/20 1158 .  sevelamer carbonate (RENVELA) tablet 800 mg, 800 mg, Oral, BID WC, Bhutani, Manpreet S, MD, 800 mg at 08/14/20 1611 .  sodium chloride flush (NS) 0.9 % injection 3 mL, 3 mL, Intravenous, Q12H, Ivor Costa, MD, 3 mL at 08/14/20 2140 .  sodium chloride flush (NS) 0.9 % injection 3  mL, 3 mL, Intravenous, PRN, Ivor Costa, MD .  vancomycin (VANCOREADY) IVPB 1500 mg/300 mL, 1,500 mg, Intravenous, Once, Berton Mount, RPH .  vitamin B-12 (CYANOCOBALAMIN) tablet 1,000 mcg, 1,000 mcg, Oral, Daily, Ivor Costa, MD, 1,000 mcg at 08/14/20 0809   Social History: Social History   Tobacco Use  . Smoking status: Never Smoker  . Smokeless tobacco: Never Used  Vaping Use  . Vaping Use: Never used  Substance Use Topics  . Alcohol use: Not Currently    Comment: glass wine week per pt  . Drug use: Yes    Types: Marijuana    Comment: once a day    Family Medical History: Family History  Problem Relation Age of Onset  . Kidney disease Mother   . Diabetes Mother   . Cancer Father   . Kidney disease Sister     Physical Examination: Vitals:   08/15/20 1245 08/15/20 1300  BP: (!) 129/58 (!) 149/62  Pulse: (!) 58 (!) 59  Resp: 13 15  Temp:    SpO2: 97% 96%     General: Patient is laying in bed and reports fatigue.  Psychiatric: Patient is anxious.  Head:  Pupils equal, round, and reactive to light.  ENT:  Oral mucosa appears dry.   Neck:   Unable to assess due to not cooperating with exam but patient appears to have full AROM.   Respiratory: Patient is breathing without any difficulty.   NEUROLOGICAL:  General: In no acute distress.   Awake, alert, oriented to person, place, and time.  Pupils equal round and reactive to light. Face symmetric.  ROM of spine: Unable to evaluate as pt declined examination.    Strength: Side Biceps Triceps Deltoid Interossei Grip  R UTA UTA UTA UTA UTA  L UTA UTA UTA UTA UTA   Side Iliopsoas Quads  Hamstring PF DF EHL  R 5 UTA UTA 5 5 UTA  L 5 UTA UTA 5 5 UTA   Unable to evaluate pt for reflexes or clonus as pt uncooperative with examination.   Imaging: MRI thoracic spine 08/11/2020: CLINICAL DATA:  Initial evaluation for possible osteomyelitis.  EXAM: MRI THORACIC SPINE WITHOUT CONTRAST  TECHNIQUE: Multiplanar, multisequence MR imaging of the thoracic spine was performed. No intravenous contrast was administered.  COMPARISON:  Prior CT from 10/11/2019  FINDINGS: Alignment: Physiologic with preservation of the normal thoracic kyphosis. No listhesis.  Vertebrae: Vertebral body height maintained without acute or chronic fracture. Chronic endplate Schmorl's node deformity noted at the superior endplate of L2. T11 and T12 vertebral bodies are partially ankylosed anteriorly.  Intervertebral disc space narrowing with increased fluid signal intensity seen within the T7-8 interspace anteriorly. Mild associated endplate irregularity within the adjacent T7 and T8 endplates. Abnormal edema within the T7 and T8 vertebral bodies. Possible minimal edema within the adjacent paraspinous soft tissues. While these findings are somewhat nonspecific, appearance is most concerning for possible osteomyelitis discitis. No discrete paraspinous collection. No epidural abscess or other collection identified.  No other evidence for acute infection within the thoracic spine.  Cord:  Normal signal and morphology.  No epidural collections.  Paraspinal and other soft tissues: Possible minimal paraspinous edema adjacent to the T7-8 interspace. Paraspinous soft tissues demonstrate no other acute finding. Irregular bilateral pleural effusions noted, better evaluated on prior CT.  Disc levels:  Normal expected multilevel disc desiccation seen throughout the thoracic spine for age. Chronic endplate Schmorl's node deformity without edema at the superior endplate of L2. No significant  disc bulge or focal disc herniation. No significant spinal stenosis. Foramina remain patent.  IMPRESSION: 1. Findings concerning for possible osteomyelitis discitis at the T7-8 interspace. Correlation with symptomatology and laboratory values recommended. No epidural abscess or other collection identified. 2. Irregular bilateral pleural effusions, better evaluated on prior CT.  Assessment and Plan: Ms. Tenenbaum is a pleasant 59 y.o. female with suspected osteomyelitis discitis at T7-8. She has had an aspiration, cultures from this are NGTD.  ID has been consulted.   We had recommended a repeat aspiration of the disc space as well as of the pleural fluid, however pt refused repeat aspiration.   Agree with ID plan to treat empirically with IV antibiotics and re image after 6 to 8 weeks of treatment.  No acute neurosurgical intervention indicated at this time.   Lonell Face, NP Dept. of Neurosurgery

## 2020-08-15 NOTE — Progress Notes (Signed)
OT Cancellation Note  Patient Details Name: [name] MRN: [id number] DOB: Aug 14, 1961   Cancelled Treatment:    Reason Eval/Treat Not Completed: Patient at procedure or test/ unavailable. Thank you for the OT consult. Order received and chart reviewed. Upon arrival to unit, pt noted to be out of her room for dialysis. Per RN, plans for vascular procedure after dialysis this date. Will hold OT evaluation and re-attempt at a later date/time as available and pt medically appropriate for OT services.   Shara Blazing, M.S., OTR/L Ascom: [phone] 08/15/20, 1:32 PM

## 2020-08-16 DIAGNOSIS — N186 End stage renal disease: Secondary | ICD-10-CM | POA: Diagnosis not present

## 2020-08-16 DIAGNOSIS — M4644 Discitis, unspecified, thoracic region: Secondary | ICD-10-CM | POA: Diagnosis not present

## 2020-08-16 DIAGNOSIS — I5033 Acute on chronic diastolic (congestive) heart failure: Secondary | ICD-10-CM | POA: Diagnosis not present

## 2020-08-16 DIAGNOSIS — Z992 Dependence on renal dialysis: Secondary | ICD-10-CM | POA: Diagnosis not present

## 2020-08-16 LAB — CULTURE, BLOOD (ROUTINE X 2)
Culture: NO GROWTH
Culture: NO GROWTH

## 2020-08-16 LAB — GLUCOSE, CAPILLARY
Glucose-Capillary: 100 mg/dL — ABNORMAL HIGH (ref 70–99)
Glucose-Capillary: 127 mg/dL — ABNORMAL HIGH (ref 70–99)
Glucose-Capillary: 66 mg/dL — ABNORMAL LOW (ref 70–99)
Glucose-Capillary: 75 mg/dL (ref 70–99)
Glucose-Capillary: 85 mg/dL (ref 70–99)
Glucose-Capillary: 88 mg/dL (ref 70–99)
Glucose-Capillary: 92 mg/dL (ref 70–99)

## 2020-08-16 NOTE — Progress Notes (Signed)
Rechecked blood sugar and it increased to 100. Patient encouraged to eat throughout the day.  Will continue to monitor. Christene Slates

## 2020-08-16 NOTE — Progress Notes (Signed)
PROGRESS NOTE    [name]  GBT:[id number] DOB: July 23, 1961 DOA: 08/10/2020 PCP: [facility]   Assessment & Plan:   Principal Problem:   Acute pulmonary edema ([place]) Active Problems:   End-stage renal disease on hemodialysis ([place])   Type 2 diabetes mellitus with hypoglycemia without coma, without long-term current use of insulin (HCC)   Acute respiratory failure with hypoxia (HCC)   Acute on chronic diastolic CHF (congestive heart failure) (HCC)   Hypokalemia   Hypoglycemia   Sinus bradycardia   TIA (transient ischemic attack)   Osteomyelitis (HCC)   Benign essential HTN   Anemia in ESRD (end-stage renal disease) (HCC)   Fluid overload   HLD (hyperlipidemia)   Chest pain   CAD (coronary artery disease)   GERD (gastroesophageal reflux disease)   Discitis of thoracic region   Advanced care planning/counseling discussion  T7/T8 discitis vs osteomyelitis: thoracic spine cx NGTD.  No abxs currently as per ID. Now agreed repeat aspirate. Reached out to IR, Dr. Jarvis Newcomer, about putting the pt on the tomorrow schedule and waiting on a response. NPO after midnight. Continue to hold aspirin  Acute hypoxic respiratory failure:  with pulse ox of 87% on room air on the 19th. Weaned off of supplemental oxygen. Resolved   Thrombocytopenia: etiology unclear. Will continue to monitor   Hypokalemia: potassium will be managed w/ HD.   Acute diastolic congestive heart failure: continue w/ HD for fluid removal as per nephro   ACD: s/p 1 unit of pRBCs transfused. Will transfuse if Hb <7.0.   Relative hypoglycemia: HbA1c 3.9. Encourage po diet  ESRD: on HD MWF. Management per nephro  Hx of renal cell cancer status: s/p cryoablation. Management per onco as an outpatient   Chronic pancreatitis: continue on creon  DVT prophylaxis: SCDs Code Status: full  Family Communication: discussed pt's care w/ pt's son, [name], and answered his questions  Disposition Plan: likely  d/c back home w/ home health    Status is: Inpatient  Remains inpatient appropriate because:Ongoing diagnostic testing needed not appropriate for outpatient work up, IV treatments appropriate due to intensity of illness or inability to take PO and Inpatient level of care appropriate due to severity of illness   Dispo: The patient is from: Home              Anticipated d/c is to: Home w/ home health               Anticipated d/c date is:  2 or 3 days               Patient currently is not medically stable to d/c.     Consultants:   ID  nephro  neurosurg   Procedures:    Antimicrobials: vanco, ceftazidine    Subjective: Pt c/o intermittent back pain   Objective: Vitals:   08/16/20 0426 08/16/20 0435 08/16/20 0844 08/16/20 1159  BP: (!) 145/62  (!) 150/67 (!) 160/71  Pulse: 67  69 68  Resp: 16  18 16   Temp: 97.9 F (36.6 C)  98.4 F (36.9 C) 98.3 F (36.8 C)  TempSrc: Oral  Oral Oral  SpO2: 97%  96% 94%  Weight:  79.3 kg    Height:        Intake/Output Summary (Last 24 hours) at 08/16/2020 1228 Last data filed at 08/15/2020 1825 Gross per 24 hour  Intake 114.28 ml  Output 1500 ml  Net -1385.72 ml   [employer]  08/14/20 0428 08/15/20 0500 08/16/20 0435  Weight: 78.6 kg 80.1 kg 79.3 kg    Examination:  General exam: Appears calm but uncomfortable  Respiratory system: decreased breath sounds b/l. No rales Cardiovascular system: S1/S2+. No rubs or gallops Gastrointestinal system: Abdomen is nondistended, soft and nontender. Hyperactive bowel sounds heard. Central nervous system: Alert and oriented. Moves all 4 extremities  Psychiatry: Judgement and insight appear normal. Flat mood and affect     Data Reviewed: I have personally reviewed following labs and imaging studies  CBC: Recent Labs  Lab 08/10/20 1120 08/11/20 0110 08/12/20 0425 08/13/20 1711 08/15/20 0900  WBC 4.3 3.5* 4.0 6.2 4.2  NEUTROABS  --   --  1.1* 2.4  --   HGB 7.4*  7.1* 6.9* 8.8* 8.0*  HCT 22.5* 22.1* 22.1* 27.5* 24.6*  MCV 87.9 89.1 90.2 87.9 86.9  PLT 146* 107* 115* 161 937*   Basic Metabolic Panel: Recent Labs  Lab 08/10/20 1120 08/10/20 1330 08/11/20 0110 08/12/20 0425 08/13/20 1711 08/15/20 0900  NA 139  --  137 135 136  134* 134*  K 2.9*  --  3.3* 3.9 4.3  4.2 3.3*  CL 99  --  99 95* 96*  99 93*  CO2 28  --  29 29 30  31 31   GLUCOSE 46*  --  64* 101* 65*  66* 97  BUN 8  --  6* 10 16  16 8   CREATININE 3.74*  --  3.06* 4.28* 5.64*  5.62* 2.65*  CALCIUM 7.7*  --  7.5* 8.2* 8.3*  7.8* 8.0*  MG  --  1.8 1.8  --   --   --   PHOS  --   --  2.4*  --  4.0 1.5*   GFR: Estimated Creatinine Clearance: 23.2 mL/min (A) (by C-G formula based on SCr of 2.65 mg/dL (H)). Liver Function Tests: Recent Labs  Lab 08/10/20 1120 08/12/20 0425 08/13/20 1711 08/15/20 0900  AST 17 15 15   --   ALT 10 9 9   --   ALKPHOS 68 70 78  --   BILITOT 0.5 0.5 0.3  --   PROT 6.1* 6.0* 6.8  --   ALBUMIN 2.4* 2.3* 2.5*  2.5* 2.4*   No results for input(s): LIPASE, AMYLASE in the last 168 hours. No results for input(s): AMMONIA in the last 168 hours. Coagulation Profile: No results for input(s): INR, PROTIME in the last 168 hours. Cardiac Enzymes: No results for input(s): CKTOTAL, CKMB, CKMBINDEX, TROPONINI in the last 168 hours. BNP (last 3 results) No results for input(s): PROBNP in the last 8760 hours. HbA1C: No results for input(s): HGBA1C in the last 72 hours. CBG: Recent Labs  Lab 08/15/20 2346 08/16/20 0427 08/16/20 0601 08/16/20 0803 08/16/20 1154  GLUCAP 104* 66* 100* 92 88   Lipid Profile: No results for input(s): CHOL, HDL, LDLCALC, TRIG, CHOLHDL, LDLDIRECT in the last 72 hours. Thyroid Function Tests: No results for input(s): TSH, T4TOTAL, FREET4, T3FREE, THYROIDAB in the last 72 hours. Anemia Panel: No results for input(s): VITAMINB12, FOLATE, FERRITIN, TIBC, IRON, RETICCTPCT in the last 72 hours. Sepsis Labs: No results for  input(s): PROCALCITON, LATICACIDVEN in the last 168 hours.  Recent Results (from the past 240 hour(s))  Resp Panel by RT-PCR (Flu A&B, Covid) Nasopharyngeal Swab     Status: None   Collection Time: 08/10/20 11:20 AM   Specimen: Nasopharyngeal Swab; Nasopharyngeal(NP) swabs in vial transport medium  Result Value Ref Range Status   SARS Coronavirus  2 by RT PCR NEGATIVE NEGATIVE Final    Comment: (NOTE) SARS-CoV-2 target nucleic acids are NOT DETECTED.  The SARS-CoV-2 RNA is generally detectable in upper respiratory specimens during the acute phase of infection. The lowest concentration of SARS-CoV-2 viral copies this assay can detect is 138 copies/mL. A negative result does not preclude SARS-Cov-2 infection and should not be used as the sole basis for treatment or other patient management decisions. A negative result may occur with  improper specimen collection/handling, submission of specimen other than nasopharyngeal swab, presence of viral mutation(s) within the areas targeted by this assay, and inadequate number of viral copies(<138 copies/mL). A negative result must be combined with clinical observations, patient history, and epidemiological information. The expected result is Negative.  Fact Sheet for Patients:  EntrepreneurPulse.com.au  Fact Sheet for Healthcare Providers:  IncredibleEmployment.be  This test is no t yet approved or cleared by the [place] FDA and  has been authorized for detection and/or diagnosis of SARS-CoV-2 by FDA under an Emergency Use Authorization (EUA). This EUA will remain  in effect (meaning this test can be used) for the duration of the COVID-19 declaration under Section 564(b)(1) of the Act, 21 U.S.C.section 360bbb-3(b)(1), unless the authorization is terminated  or revoked sooner.       Influenza A by PCR NEGATIVE NEGATIVE Final   Influenza B by PCR NEGATIVE NEGATIVE Final    Comment: (NOTE) The  Xpert Xpress SARS-CoV-2/FLU/RSV plus assay is intended as an aid in the diagnosis of influenza from Nasopharyngeal swab specimens and should not be used as a sole basis for treatment. Nasal washings and aspirates are unacceptable for Xpert Xpress SARS-CoV-2/FLU/RSV testing.  Fact Sheet for Patients: EntrepreneurPulse.com.au  Fact Sheet for Healthcare Providers: IncredibleEmployment.be  This test is not yet approved or cleared by the [place] FDA and has been authorized for detection and/or diagnosis of SARS-CoV-2 by FDA under an Emergency Use Authorization (EUA). This EUA will remain in effect (meaning this test can be used) for the duration of the COVID-19 declaration under Section 564(b)(1) of the Act, 21 U.S.C. section 360bbb-3(b)(1), unless the authorization is terminated or revoked.  Performed at [facility], [address] [place], [place] [address]   MRSA PCR Screening     Status: None   Collection Time: 08/10/20 10:12 PM   Specimen: Nasal Mucosa; Nasopharyngeal  Result Value Ref Range Status   MRSA by PCR NEGATIVE NEGATIVE Final    Comment:        The GeneXpert MRSA Assay (FDA approved for NASAL specimens only), is one component of a comprehensive MRSA colonization surveillance program. It is not intended to diagnose MRSA infection nor to guide or monitor treatment for MRSA infections. Performed at [facility], [address] [place], [place] [address]   Culture, blood (Routine X 2) w Reflex to ID Panel     Status: None   Collection Time: 08/11/20 12:51 AM   Specimen: BLOOD  Result Value Ref Range Status   Specimen Description BLOOD BLOOD LEFT HAND  Final   Special Requests   Final    BOTTLES DRAWN AEROBIC ONLY Blood Culture results may not be optimal due to an inadequate volume of blood received in culture bottles   Culture   Final    NO GROWTH 5 DAYS Performed at [facility], [address], [place], [place] [address]    Report Status 08/16/2020 FINAL  Final  Culture, blood (Routine X 2) w Reflex to ID Panel  Status: None   Collection Time: 08/11/20  1:03 AM   Specimen: BLOOD  Result Value Ref Range Status   Specimen Description BLOOD BLOOD LEFT HAND  Final   Special Requests   Final    BOTTLES DRAWN AEROBIC AND ANAEROBIC Blood Culture results may not be optimal due to an inadequate volume of blood received in culture bottles   Culture   Final    NO GROWTH 5 DAYS Performed at [facility], [address] [place], [place] [address]    Report Status 08/16/2020 FINAL  Final  Aerobic/Anaerobic Culture (surgical/deep wound)     Status: None (Preliminary result)   Collection Time: 08/13/20  2:08 PM   Specimen: Wound; Abscess  Result Value Ref Range Status   Specimen Description   Final    WOUND Performed at [facility], [address], [place], [place] [address]    Special Requests   Final    [place] Performed at [facility], [place], [place] [address]    Gram Stain NO WBC SEEN NO ORGANISMS SEEN   Final   Culture   Final    NO GROWTH 3 DAYS NO ANAEROBES ISOLATED; CULTURE IN PROGRESS FOR 5 DAYS Performed at [facility] [address], [place], [place] [address]    Report Status PENDING  Incomplete         Radiology Studies: No results found.      Scheduled Meds: . sodium chloride   Intravenous Once  . acetaminophen  650 mg Oral Once  . amLODipine  10 mg Oral Daily  . ascorbic acid  500 mg Oral Q breakfast  . atorvastatin  20 mg Oral QPM  . carvedilol  3.125 mg Oral BID WC  . Chlorhexidine Gluconate Cloth  6 each Topical Q0600  . cholecalciferol  1,000 Units Oral Daily  . cholestyramine light  4 g Oral BID  . epoetin (EPOGEN/PROCRIT) injection  8,000 Units Intravenous Q M,W,F-HD  . feeding supplement  237 mL Oral Q24H  . ferrous sulfate  325 mg Oral Q breakfast  . furosemide  80 mg  Oral Daily  . gabapentin  100 mg Oral QPM  . hydrALAZINE  50 mg Oral Q8H  . hydrOXYzine  25 mg Oral Daily  . lipase/protease/amylase  36,000 Units Oral TID AC  . multivitamin  1 tablet Oral Daily  . omega-3 acid ethyl esters  1 capsule Oral BID  . pantoprazole (PROTONIX) IV  40 mg Intravenous Q12H  . saccharomyces boulardii  250 mg Oral Daily  . sodium chloride flush  3 mL Intravenous Q12H  . cyanocobalamin  1,000 mcg Oral Daily   Continuous Infusions: . sodium chloride Stopped (08/15/20 1825)  . [START ON 08/17/2020] cefTAZidime (FORTAZ)  IV    . [START ON 08/17/2020] vancomycin       LOS: 5 days    Time spent: 34  mins     Wyvonnia Dusky, MD [facility] Pager [phone]  If 7PM-7AM, please contact night-coverage 08/16/2020, 12:28 PM

## 2020-08-16 NOTE — Consult Note (Signed)
PHARMACY CONSULT NOTE FOR:  OUTPATIENT  PARENTERAL ANTIBIOTIC THERAPY (OPAT)  Indication: Thoracic vertebral osteomyelitis with disciits Regimen:  Vancomycin 1000 mg on M-W-F during dialyses  Ceftazidime 1 gram on M-W-F during dialysis End date: 10/10/20  IV antibiotic discharge orders are pended. To discharging provider:  please sign these orders via discharge navigator,  Select New Orders & click on the button choice - Manage This Unsigned Work.     Thank you for allowing pharmacy to be a part of this patient's care.  Oswald Hillock 08/16/2020, 11:39 AM

## 2020-08-16 NOTE — Progress Notes (Signed)
[facility]  ROUNDING NOTE   Subjective:   [name] is 59 years old female, well known to our practice,presented to the ED  on 08/10/2020, with worsening SOB. She also reported chest pain, which resolved prior to  ED arrival. SpO2 was 83% on arrival, currently 100% on 3L O2 via nasal cannula.Patient has h/o ESRD, on dialysis MWF.  Hemodialysis treatment yesterday. Tolerated treatment well.   Patient is going back and forth about having another aspiration.   Objective:  Vital signs in last 24 hours:  Temp:  [97.9 F (36.6 C)-98.5 F (36.9 C)] 98.4 F (36.9 C) (11/25 0844) Pulse Rate:  [56-69] 69 (11/25 0844) Resp:  [8-19] 18 (11/25 0844) BP: (126-150)/(50-67) 150/67 (11/25 0844) SpO2:  [94 %-99 %] 96 % (11/25 0844) Weight:  [79.3 kg] 79.3 kg (11/25 0435)  Weight change: -0.8 kg Filed Weights   08/14/20 0428 08/15/20 0500 08/16/20 0435  Weight: 78.6 kg 80.1 kg 79.3 kg    Intake/Output: I/O last 3 completed shifts: In: 114.3 [I.V.:2.6; IV Piggyback:111.7] Out: 1500 [Other:1500]   Intake/Output this shift:  No intake/output data recorded.  Physical Exam: General:  Sleeping, laying in bed. NAD  Head: Normocephalic,atraumatic  Eyes:  Anicteric  Lungs:   Normal and symmetrical respiratory effort, lungs clear  Heart: regular  Abdomen:  Soft, nontender, nondistended  Extremities:  trace peripheral edema   Neurologic:  Awake, speech clear and appropriate, moving all four extremities  Skin: No acute lesions or rashes  Access: Lt IJ catheter    Basic Metabolic Panel: Recent Labs  Lab 08/10/20 1120 08/10/20 1120 08/10/20 1330 08/11/20 0110 08/11/20 0110 08/12/20 0425 08/13/20 1711 08/15/20 0900  NA 139  --   --  137  --  135 136  134* 134*  K 2.9*  --   --  3.3*  --  3.9 4.3  4.2 3.3*  CL 99  --   --  99  --  95* 96*  99 93*  CO2 28  --   --  29  --  29 30  31 31   GLUCOSE 46*  --   --  64*  --  101* 65*  66* 97  BUN 8  --   --  6*  --  10 16   16 8   CREATININE 3.74*  --   --  3.06*  --  4.28* 5.64*  5.62* 2.65*  CALCIUM 7.7*   < >  --  7.5*   < > 8.2* 8.3*  7.8* 8.0*  MG  --   --  1.8 1.8  --   --   --   --   PHOS  --   --   --  2.4*  --   --  4.0 1.5*   < > = values in this interval not displayed.    Liver Function Tests: Recent Labs  Lab 08/10/20 1120 08/12/20 0425 08/13/20 1711 08/15/20 0900  AST 17 15 15   --   ALT 10 9 9   --   ALKPHOS 68 70 78  --   BILITOT 0.5 0.5 0.3  --   PROT 6.1* 6.0* 6.8  --   ALBUMIN 2.4* 2.3* 2.5*  2.5* 2.4*   No results for input(s): LIPASE, AMYLASE in the last 168 hours. No results for input(s): AMMONIA in the last 168 hours.  CBC: Recent Labs  Lab 08/10/20 1120 08/11/20 0110 08/12/20 0425 08/13/20 1711 08/15/20 0900  WBC 4.3 3.5* 4.0 6.2 4.2  NEUTROABS  --   --  1.1* 2.4  --   HGB 7.4* 7.1* 6.9* 8.8* 8.0*  HCT 22.5* 22.1* 22.1* 27.5* 24.6*  MCV 87.9 89.1 90.2 87.9 86.9  PLT 146* 107* 115* 161 114*    Cardiac Enzymes: No results for input(s): CKTOTAL, CKMB, CKMBINDEX, TROPONINI in the last 168 hours.  BNP: Invalid input(s): POCBNP  CBG: Recent Labs  Lab 08/15/20 2001 08/15/20 2346 08/16/20 0427 08/16/20 0601 08/16/20 0803  GLUCAP 100* 104* 66* 100* 92    Microbiology: Results for orders placed or performed during the hospital encounter of 08/10/20  Resp Panel by RT-PCR (Flu A&B, Covid) Nasopharyngeal Swab     Status: None   Collection Time: 08/10/20 11:20 AM   Specimen: Nasopharyngeal Swab; Nasopharyngeal(NP) swabs in vial transport medium  Result Value Ref Range Status   SARS Coronavirus 2 by RT PCR NEGATIVE NEGATIVE Final    Comment: (NOTE) SARS-CoV-2 target nucleic acids are NOT DETECTED.  The SARS-CoV-2 RNA is generally detectable in upper respiratory specimens during the acute phase of infection. The lowest concentration of SARS-CoV-2 viral copies this assay can detect is 138 copies/mL. A negative result does not preclude SARS-Cov-2 infection  and should not be used as the sole basis for treatment or other patient management decisions. A negative result may occur with  improper specimen collection/handling, submission of specimen other than nasopharyngeal swab, presence of viral mutation(s) within the areas targeted by this assay, and inadequate number of viral copies(<138 copies/mL). A negative result must be combined with clinical observations, patient history, and epidemiological information. The expected result is Negative.  Fact Sheet for Patients:  EntrepreneurPulse.com.au  Fact Sheet for Healthcare Providers:  IncredibleEmployment.be  This test is no t yet approved or cleared by the [place] FDA and  has been authorized for detection and/or diagnosis of SARS-CoV-2 by FDA under an Emergency Use Authorization (EUA). This EUA will remain  in effect (meaning this test can be used) for the duration of the COVID-19 declaration under Section 564(b)(1) of the Act, 21 U.S.C.section 360bbb-3(b)(1), unless the authorization is terminated  or revoked sooner.       Influenza A by PCR NEGATIVE NEGATIVE Final   Influenza B by PCR NEGATIVE NEGATIVE Final    Comment: (NOTE) The Xpert Xpress SARS-CoV-2/FLU/RSV plus assay is intended as an aid in the diagnosis of influenza from Nasopharyngeal swab specimens and should not be used as a sole basis for treatment. Nasal washings and aspirates are unacceptable for Xpert Xpress SARS-CoV-2/FLU/RSV testing.  Fact Sheet for Patients: EntrepreneurPulse.com.au  Fact Sheet for Healthcare Providers: IncredibleEmployment.be  This test is not yet approved or cleared by the [place] FDA and has been authorized for detection and/or diagnosis of SARS-CoV-2 by FDA under an Emergency Use Authorization (EUA). This EUA will remain in effect (meaning this test can be used) for the duration of the COVID-19 declaration  under Section 564(b)(1) of the Act, 21 U.S.C. section 360bbb-3(b)(1), unless the authorization is terminated or revoked.  Performed at [facility], [address] [place], [place] [address]   MRSA PCR Screening     Status: None   Collection Time: 08/10/20 10:12 PM   Specimen: Nasal Mucosa; Nasopharyngeal  Result Value Ref Range Status   MRSA by PCR NEGATIVE NEGATIVE Final    Comment:        The GeneXpert MRSA Assay (FDA approved for NASAL specimens only), is one component of a comprehensive MRSA colonization surveillance program. It is not intended to  diagnose MRSA infection nor to guide or monitor treatment for MRSA infections. Performed at [facility], [address] [place], [place] [address]   Culture, blood (Routine X 2) w Reflex to ID Panel     Status: None   Collection Time: 08/11/20 12:51 AM   Specimen: BLOOD  Result Value Ref Range Status   Specimen Description BLOOD BLOOD LEFT HAND  Final   Special Requests   Final    BOTTLES DRAWN AEROBIC ONLY Blood Culture results may not be optimal due to an inadequate volume of blood received in culture bottles   Culture   Final    NO GROWTH 5 DAYS Performed at [facility], [address] [place], [place] [address]    Report Status 08/16/2020 FINAL  Final  Culture, blood (Routine X 2) w Reflex to ID Panel     Status: None   Collection Time: 08/11/20  1:03 AM   Specimen: BLOOD  Result Value Ref Range Status   Specimen Description BLOOD BLOOD LEFT HAND  Final   Special Requests   Final    BOTTLES DRAWN AEROBIC AND ANAEROBIC Blood Culture results may not be optimal due to an inadequate volume of blood received in culture bottles   Culture   Final    NO GROWTH 5 DAYS Performed at [facility], [address], [place], [place] [address]    Report Status 08/16/2020 FINAL  Final  Aerobic/Anaerobic Culture (surgical/deep wound)     Status: None (Preliminary result)   Collection Time:  08/13/20  2:08 PM   Specimen: Wound; Abscess  Result Value Ref Range Status   Specimen Description   Final    WOUND Performed at [facility], [address], [place], [place] [address]    Special Requests   Final    [place] Performed at [facility], [place], [place] [address]    Gram Stain NO WBC SEEN NO ORGANISMS SEEN   Final   Culture   Final    NO GROWTH 3 DAYS NO ANAEROBES ISOLATED; CULTURE IN PROGRESS FOR 5 DAYS Performed at [facility] [address], [place], [place] [address]    Report Status PENDING  Incomplete    Coagulation Studies: No results for input(s): LABPROT, INR in the last 72 hours.  Urinalysis: No results for input(s): COLORURINE, LABSPEC, PHURINE, GLUCOSEU, HGBUR, BILIRUBINUR, KETONESUR, PROTEINUR, UROBILINOGEN, NITRITE, LEUKOCYTESUR in the last 72 hours.  Invalid input(s): APPERANCEUR    Imaging: No results found.   Medications:   . sodium chloride Stopped (08/15/20 1825)  . [START ON 08/17/2020] cefTAZidime (FORTAZ)  IV    . [START ON 08/17/2020] vancomycin     . sodium chloride   Intravenous Once  . acetaminophen  650 mg Oral Once  . amLODipine  10 mg Oral Daily  . ascorbic acid  500 mg Oral Q breakfast  . atorvastatin  20 mg Oral QPM  . carvedilol  3.125 mg Oral BID WC  . Chlorhexidine Gluconate Cloth  6 each Topical Q0600  . cholecalciferol  1,000 Units Oral Daily  . cholestyramine light  4 g Oral BID  . epoetin (EPOGEN/PROCRIT) injection  8,000 Units Intravenous Q M,W,F-HD  . feeding supplement  237 mL Oral Q24H  . ferrous sulfate  325 mg Oral Q breakfast  . furosemide  80 mg Oral Daily  . gabapentin  100 mg Oral QPM  . hydrALAZINE  50 mg Oral Q8H  . hydrOXYzine  25 mg Oral Daily  . lipase/protease/amylase  36,000 Units Oral TID AC  . multivitamin  1 tablet Oral Daily  . omega-3 acid ethyl esters  1 capsule Oral BID  . pantoprazole (PROTONIX) IV  40 mg Intravenous Q12H  . saccharomyces  boulardii  250 mg Oral Daily  . sodium chloride flush  3 mL Intravenous Q12H  . cyanocobalamin  1,000 mcg Oral Daily   sodium chloride, acetaminophen, albuterol, alum & mag hydroxide-simeth, ammonium lactate, calcium carbonate, dextromethorphan-guaiFENesin, dextrose, diphenhydrAMINE, hydrALAZINE, HYDROcodone-acetaminophen, morphine injection, mupirocin ointment, nitroGLYCERIN, promethazine, sodium chloride flush  Assessment/ Plan:  Ms. JANNETH KRASNER is a 59 y.o.  female well known to our practice,presented to the ED, with worsening SOB. She also reported chest pain, which resolved prior to  ED arrival. SpO2 was 83% on arrival, currently 100% on 3L O2 via nasal cannula. Patient has h/o ESRD, on dialysis MWF. We will arrange dialysis for her today.Orders placed.  #ESRD on dialysis MWF Continue Monday Wednesday Friday schedule  # Hypertension Controlled with current antihypertensive regimen: amlodipine, carvedilol and furosemide.   #Secondary Hyperparathyroidism Lab Results  Component Value Date   PTH 357 (H) 10/05/2018   CALCIUM 8.0 (L) 08/15/2020   CAION 1.09 (L) 04/13/2020   PHOS 1.5 (L) 08/15/2020  We will continue monitoring bone mineral metabolism parameters  # Acute on chronic diastolic CHF Volume status has improved.   #Anemia of Chronic Kidney Disease Lab Results  Component Value Date   HGB 8.0 (L) 08/15/2020  Epogen with dialysis, blood transfusion during this admission Patient is on oral iron supplements   #Disciitis: appreciate ID input.  Vancomycin 1000mg  and ceftazidime 1g with each dialysis treatment until 10/10/20.     LOS: 5 Ercil Cassis 11/25/202111:38 AM

## 2020-08-16 NOTE — Progress Notes (Signed)
Patient's blood sugar is 66. Given orange juice with sugar added. Will recheck in an hour.  [name]

## 2020-08-17 DIAGNOSIS — N186 End stage renal disease: Secondary | ICD-10-CM | POA: Diagnosis not present

## 2020-08-17 DIAGNOSIS — I5033 Acute on chronic diastolic (congestive) heart failure: Secondary | ICD-10-CM | POA: Diagnosis not present

## 2020-08-17 DIAGNOSIS — Z992 Dependence on renal dialysis: Secondary | ICD-10-CM | POA: Diagnosis not present

## 2020-08-17 DIAGNOSIS — M4644 Discitis, unspecified, thoracic region: Secondary | ICD-10-CM | POA: Diagnosis not present

## 2020-08-17 LAB — CBC
HCT: 26.2 % — ABNORMAL LOW (ref 36.0–46.0)
Hemoglobin: 8.6 g/dL — ABNORMAL LOW (ref 12.0–15.0)
MCH: 28.3 pg (ref 26.0–34.0)
MCHC: 32.8 g/dL (ref 30.0–36.0)
MCV: 86.2 fL (ref 80.0–100.0)
Platelets: 141 10*3/uL — ABNORMAL LOW (ref 150–400)
RBC: 3.04 MIL/uL — ABNORMAL LOW (ref 3.87–5.11)
RDW: 18.6 % — ABNORMAL HIGH (ref 11.5–15.5)
WBC: 3.5 10*3/uL — ABNORMAL LOW (ref 4.0–10.5)
nRBC: 0 % (ref 0.0–0.2)

## 2020-08-17 LAB — RENAL FUNCTION PANEL
Albumin: 2.3 g/dL — ABNORMAL LOW (ref 3.5–5.0)
Anion gap: 10 (ref 5–15)
BUN: 12 mg/dL (ref 8–23)
CO2: 29 mmol/L (ref 22–32)
Calcium: 8.3 mg/dL — ABNORMAL LOW (ref 8.9–10.3)
Chloride: 94 mmol/L — ABNORMAL LOW (ref 98–111)
Creatinine, Ser: 3.61 mg/dL — ABNORMAL HIGH (ref 0.44–1.00)
GFR, Estimated: 14 mL/min — ABNORMAL LOW (ref >60)
Glucose, Bld: 103 mg/dL — ABNORMAL HIGH (ref 70–99)
Phosphorus: 1.9 mg/dL — ABNORMAL LOW (ref 2.5–4.6)
Potassium: 4.1 mmol/L (ref 3.5–5.1)
Sodium: 133 mmol/L — ABNORMAL LOW (ref 135–145)

## 2020-08-17 LAB — GLUCOSE, CAPILLARY
Glucose-Capillary: 113 mg/dL — ABNORMAL HIGH (ref 70–99)
Glucose-Capillary: 57 mg/dL — ABNORMAL LOW (ref 70–99)
Glucose-Capillary: 114 mg/dL — ABNORMAL HIGH (ref 70–99)
Glucose-Capillary: 66 mg/dL — ABNORMAL LOW (ref 70–99)
Glucose-Capillary: 72 mg/dL (ref 70–99)

## 2020-08-17 LAB — DIFFERENTIAL
Abs Immature Granulocytes: 0.01 10*3/uL (ref 0.00–0.07)
Basophils Absolute: 0 10*3/uL (ref 0.0–0.1)
Basophils Relative: 0 %
Eosinophils Absolute: 0.5 10*3/uL (ref 0.0–0.5)
Eosinophils Relative: 15 %
Immature Granulocytes: 0 %
Lymphocytes Relative: 35 %
Lymphs Abs: 1.2 10*3/uL (ref 0.7–4.0)
Monocytes Absolute: 0.1 10*3/uL (ref 0.1–1.0)
Monocytes Relative: 3 %
Neutro Abs: 1.6 10*3/uL — ABNORMAL LOW (ref 1.7–7.7)
Neutrophils Relative %: 47 %
Smear Review: NORMAL

## 2020-08-17 MED ORDER — VANCOMYCIN HCL IN DEXTROSE 1-5 GM/200ML-% IV SOLN
1000.0000 mg | INTRAVENOUS | Status: DC
  Administered 2020-08-17: 1000 mg via INTRAVENOUS
  Filled 2020-08-17: qty 200

## 2020-08-17 MED ORDER — VANCOMYCIN IV (FOR PTA / DISCHARGE USE ONLY): 1000.0000 mg | INTRAVENOUS | 0 refills | Status: DC

## 2020-08-17 MED ORDER — CEFTAZIDIME IV (FOR PTA / DISCHARGE USE ONLY): 1.0000 g | INTRAVENOUS | 0 refills | Status: DC

## 2020-08-17 MED ORDER — SODIUM CHLORIDE 0.9 % IV SOLN
1.0000 g | INTRAVENOUS | Status: DC
  Administered 2020-08-17: 1 g via INTRAVENOUS
  Filled 2020-08-17: qty 1

## 2020-08-17 MED ORDER — PROMETHAZINE HCL 25 MG/ML IJ SOLN
12.5000 mg | Freq: Four times a day (QID) | INTRAMUSCULAR | Status: DC | PRN
  Administered 2020-08-17: 12.5 mg via INTRAVENOUS
  Filled 2020-08-17: qty 1

## 2020-08-17 NOTE — Progress Notes (Signed)
VSS, assessment stable.  Reviewed AVS, medications, follow up appointments with patient- verbalized understanding, denied questions and concerns. Patient's son here downstairs, patient transported downstairs in wheelchair.  To follow up outpatient per AVS.

## 2020-08-17 NOTE — Progress Notes (Signed)
OT Cancellation Note  Patient Details Name: [name] MRN: [id number] DOB: Jul 30, 1961   Cancelled Treatment:    Reason Eval/Treat Not Completed: Patient at procedure or test/ unavailable. Upon arriving at pt's door, RN reports pt is being transported to dialysis. OT will re-attempt when pt is available.   Darleen Crocker, [place], OTR/L , [facility] ascom [phone]  08/17/20, 11:08 AM   08/17/2020, 11:07 AM

## 2020-08-17 NOTE — Discharge Summary (Signed)
Physician Discharge Summary  ERISA MEHLMAN QXI:[id number] DOB: [birth year] DOA: 08/10/2020  PCP: [facility] [place] date: 08/10/2020 Discharge date: 08/17/2020  Admitted From: home  Disposition: home w/ home health   Recommendations for Outpatient Follow-up:  1. Follow up with PCP in 1-2 weeks 2. F/u w/ ID, Dr. Delaine Lame, on 09/27/20 9:45AM 3. F/u w/ nephro in 2-3 weeks  Home Health: yes Equipment/Devices:  Discharge Condition: stable  CODE STATUS: full  Diet recommendation: Renal diet   Brief/Interim Summary: HPI was taken from Dr. Blaine Hamper: [name] is a 59 y.o. female with medical history significant of ESRD-HD (MWF),dCHF, anemia, CAD, renal cell cancer, pancreatitis, anemia, HTN, HLD, TIA, diet controled DM, COPD, asthma, depression, anxiety, who presents with shortness of breath.  Patient states that she developed shortness of breath today, which has been progressively worsening.  She has dry cough, no fever or chills. Patient also had chest pain earlier, which has resolved currently.  The chest pain was located in the substernal area, dull, moderate, nonradiating.  Patient states that she has chronic diarrhea which has not changed.  She had 1 diarrhea this morning, no vomiting, abdominal pain.  No unilateral weakness. Patient was found to have oxygen desaturation to 83% on room air, which improved her to 100% on 3 L oxygen in ED. Patient had dialysis on Wednesday.  ED Course: pt was found to have WBC 4.3, troponin level 8, pending Covid PCR, potassium 2.9, bicarbonate 28, creatinine 3.74, BUN 8, blood sugar 46 on BMI, temperature 99, blood pressure 157/66, heart rate 68, RR 20, chest x-ray showed fluid overload and pulmonary edema.  Patient is placed on progressive bed for observation.  Dr. Holley Raring of nephrology is consulted  Hospital course from Dr. Lenise Herald 11/24-11/26/21: Pt presented w/ shortness of breath likely secondary CHF exacerbation. The  shortness of breath improved greatly after HD. Pt was able to be weaned off of supplemental oxygen. Of note, pt was found to have possible T7/T8 discitis vs osteomyelitis. A thoracic spine was done and showed NGTD, so pt was not initially put on abxs. ID then recommended a repeat thoracic spine aspirate to increase the yield but IR did not feel like a repeat thoracic aspirate would have a higher yield. Pt was then d/c home w/ home health to have IV ceftazidime, IV vanco on HD days until 10/10/20 as per ID. Pt will f/u w/ ID on 09/27/20 at 9:45AM. For more information, please see previous progress/consult notes.   Discharge Diagnoses:  Principal Problem:   Acute pulmonary edema (HCC) Active Problems:   End-stage renal disease on hemodialysis (HCC)   Type 2 diabetes mellitus with hypoglycemia without coma, without long-term current use of insulin (HCC)   Acute respiratory failure with hypoxia (HCC)   Acute on chronic diastolic CHF (congestive heart failure) (HCC)   Hypokalemia   Hypoglycemia   Sinus bradycardia   TIA (transient ischemic attack)   Osteomyelitis (HCC)   Benign essential HTN   Anemia in ESRD (end-stage renal disease) (HCC)   Fluid overload   HLD (hyperlipidemia)   Chest pain   CAD (coronary artery disease)   GERD (gastroesophageal reflux disease)   Discitis of thoracic region   Advanced care planning/counseling discussion  T7/T8 discitis vs osteomyelitis: thoracic spine cx NGTD.  No abxs currently as per ID. Now agreed repeat aspirate. Reached out to IR, Dr. Jarvis Newcomer, about putting the pt on the tomorrow schedule and waiting on a response. NPO after midnight.  Continue to hold aspirin  Acute hypoxic respiratory failure:  with pulse ox of 87% on room air on the 19th. Weaned off of supplemental oxygen. Resolved   Thrombocytopenia: etiology unclear. Will continue to monitor   Hypokalemia: potassium will be managed w/ HD.   Acute diastolic congestive heart failure: continue w/ HD  for fluid removal as per nephro   ACD: s/p 1 unit of pRBCs transfused. Will transfuse if Hb <7.0.   Relative hypoglycemia: HbA1c 3.9. Encourage po diet  ESRD: on HD MWF. Management per nephro  Hx of renal cell cancer status: s/p cryoablation. Management per onco as an outpatient   Chronic pancreatitis: continue on creon  Discharge Instructions  Discharge Instructions    [facility] pharmacist to adjust dose for Vancomycin, Aminoglycosides and other anti-infective therapies as requested by physician.   Complete by: As directed    [facility] to provide Cath Flo 61m   Complete by: As directed    Administer for PICC line occlusion and as ordered by physician for other access device issues.   Anaphylaxis Kit: Provided to treat any anaphylactic reaction to the medication being provided to the patient if First Dose or when requested by physician   Complete by: As directed    Epinephrine 152mml vial / amp: Administer 0.37m7m0.37ml61mubcutaneously once for moderate to severe anaphylaxis, nurse to call physician and pharmacy when reaction occurs and call 911 if needed for immediate care   Diphenhydramine 50mg36mIV vial: Administer 25-50mg 79mM PRN for first dose reaction, rash, itching, mild reaction, nurse to call physician and pharmacy when reaction occurs   Sodium Chloride 0.9% NS 500ml I54mdminister if needed for hypovolemic blood pressure drop or as ordered by physician after call to physician with anaphylactic reaction   Change dressing on IV access line weekly and PRN   Complete by: As directed    Diet - low sodium heart healthy   Complete by: As directed    Renal diet as well   Discharge instructions   Complete by: As directed    F/u w/ ID, Dr. RavishaDelaine Lame2 at 9.45 AM. F/u w/ PCP in 1-2 weeks. F/u w/ nephro in 2-3 weeks   Flush IV access with Sodium Chloride 0.9% and Heparin 10 units/ml or 100 units/ml   Complete by: As directed    Home infusion  instructions - [facility]   Complete by: As directed    Instructions: Flush IV access with Sodium Chloride 0.9% and Heparin 10units/ml or 100units/ml   Change dressing on IV access line: Weekly and PRN   Instructions Cath Flo 2mg: Ad50mister for PICC Line occlusion and as ordered by physician for other access device   [facility] pharmacist to adjust dose for: Vancomycin, Aminoglycosides and other anti-infective therapies as requested by physician   Increase activity slowly   Complete by: As directed    Method of administration may be changed at the discretion of home infusion pharmacist based upon assessment of the patient and/or caregiver's ability to self-administer the medication ordered   Complete by: As directed    Outpatient Parenteral Antibiotic Therapy Information Antibiotic: Cefazolin (Ancef) IVPB, Vancomycin IVPB; Indications for use: T7/T8 discitis vs osteomyelitis; End Date: 10/10/2020   Complete by: As directed    Antibiotic:  Cefazolin (Ancef) IVPB Vancomycin IVPB     Indications for use: T7/T8 discitis vs osteomyelitis   End Date: 10/10/2020     Allergies as of 08/17/2020  Reactions   Ace Inhibitors Swelling, Anaphylaxis   Ativan [lorazepam] Other (See Comments)   Reaction:Hallucinations and headaches   Compazine [prochlorperazine Edisylate] Anaphylaxis, Nausea And Vomiting, Other (See Comments)   Other reaction(s): dystonia from this vs. Reglan, 23 Jul - patient relates that she takes promethazine frequently with no problems   Sumatriptan Succinate Other (See Comments)   Other reaction(s): delirium and hallucinations per [facility] records   Zofran [ondansetron] Nausea And Vomiting   Per pt. she is allergic to zofran or will experience adverse reaction like hallucination    Losartan Nausea Only   Prochlorperazine Other (See Comments)   Reaction:  Unknown . Patient does not remember reaction but she does have vertigo and anxiety along with n and v  at times. Could be used to treat any of these   Reglan [metoclopramide] Other (See Comments)   Per patient her Dr. Evelina Bucy her off it    Scopolamine Other (See Comments)   Dizziness, also has vertigo already   Tape Rash   Plastic tape causes rash   Tapentadol Rash      Medication List    TAKE these medications   albuterol (2.5 MG/3ML) 0.083% nebulizer solution Commonly known as: PROVENTIL Take 2.5 mg by nebulization every 4 (four) hours as needed for wheezing or shortness of breath.   albuterol 108 (90 Base) MCG/ACT inhaler Commonly known as: VENTOLIN HFA Inhale 2 puffs into the lungs every 4 (four) hours as needed for wheezing or shortness of breath.   alum & mag hydroxide-simeth 200-200-20 MG/5ML suspension Commonly known as: MAALOX/MYLANTA Take 15 mLs by mouth every 6 (six) hours as needed for indigestion or heartburn.   amLODipine 10 MG tablet Commonly known as: NORVASC Take 1 tablet (10 mg total) by mouth daily.   ammonium lactate 12 % lotion Commonly known as: LAC-HYDRIN Apply 1 application topically 2 (two) times daily as needed for dry skin.   aspirin EC 81 MG tablet Take 81 mg by mouth daily.   atorvastatin 20 MG tablet Commonly known as: LIPITOR Take 20 mg by mouth every evening.   calcium carbonate 500 MG chewable tablet Commonly known as: TUMS - dosed in mg elemental calcium Chew 2.5 tablets (500 mg of elemental calcium total) by mouth every 6 (six) hours as needed for indigestion.   carvedilol 6.25 MG tablet Commonly known as: COREG Take 6.25 mg by mouth 2 (two) times daily with a meal.   cefTAZidime  IVPB Commonly known as: FORTAZ Inject 1 g into the vein every Monday, Wednesday, and Friday with hemodialysis. Indication: Thoracic vertebral osteomyelitis with disciits First Dose: Yes Last Day of Therapy:  10/10/20 Labs - Once weekly:  CBC/D, CMP, ESR, vancomycin trough   Method of administration may be changed at the discretion of home infusion  pharmacist based upon assessment of the patient and/or caregiver's ability to self-administer the medication ordered.   cholestyramine light 4 g packet Commonly known as: PREVALITE Take 4 g by mouth 2 (two) times daily.   Creon 12000-38000 units Cpep capsule Generic drug: lipase/protease/amylase Take 36,000 Units by mouth 3 (three) times daily before meals.   cyanocobalamin 1000 MCG tablet Take 1,000 mcg by mouth daily.   diphenhydrAMINE 25 MG tablet Commonly known as: BENADRYL Take 25 mg by mouth every 6 (six) hours as needed for itching.   epoetin alfa 10000 UNIT/ML injection Commonly known as: EPOGEN Inject 1 mL (10,000 Units total) into the vein every Monday, Wednesday, and Friday with hemodialysis.   feeding  supplement Liqd Take 237 mLs by mouth daily.   furosemide 80 MG tablet Commonly known as: LASIX Take 80 mg by mouth daily.   gabapentin 100 MG capsule Commonly known as: NEURONTIN Take 100 mg by mouth every evening.   hydrALAZINE 50 MG tablet Commonly known as: APRESOLINE Take 1 tablet (50 mg total) by mouth every 8 (eight) hours.   HYDROcodone-acetaminophen 5-325 MG tablet Commonly known as: NORCO/VICODIN Take 1 tablet by mouth 3 (three) times daily as needed for moderate pain or severe pain.   hydrOXYzine 25 MG tablet Commonly known as: ATARAX/VISTARIL Take 25 mg by mouth daily.   Iron 325 (65 Fe) MG Tabs Take 325 mg by mouth daily.   multivitamin Tabs tablet Take 1 tablet by mouth daily.   mupirocin ointment 2 % Commonly known as: BACTROBAN Apply 1 application topically daily as needed (leg rash).   nitroGLYCERIN 0.4 MG SL tablet Commonly known as: NITROSTAT Place 0.4 mg under the tongue every 5 (five) minutes as needed for chest pain.   Omega-3 300 MG Caps Take 300 mg by mouth 2 (two) times daily. Every other day opposite omega XL   pantoprazole 40 MG tablet Commonly known as: PROTONIX Take 40 mg by mouth daily.   polyethylene glycol 17 g  packet Commonly known as: MIRALAX / GLYCOLAX Take 17 g by mouth daily.   PROBIOTIC PO Take 1 capsule by mouth daily.   promethazine 25 MG tablet Commonly known as: PHENERGAN Take 1 tablet (25 mg total) by mouth every 6 (six) hours as needed for nausea or vomiting.   sevelamer carbonate 800 MG tablet Commonly known as: RENVELA Take 800 mg by mouth 4 (four) times daily. (at meals and with a snack)   vancomycin  IVPB Inject 1,000 mg into the vein every Monday, Wednesday, and Friday with hemodialysis. Indication:  Thoracic vertebral osteomyelitis with disciits First Dose: Yes Last Day of Therapy:  10/10/20 Labs - Weekly:  CBC/D, CMP, vancomycin trough, ESR Method of administration:Elastomeric Method of administration may be changed at the discretion of the patient and/or caregiver's ability to self-administer the medication ordered.   VITA-C PO Take 1 tablet by mouth daily.   Vitamin D3 25 MCG (1000 UT) Caps Take 1,000 Units by mouth daily.            Discharge Care Instructions  (From admission, onward)         Start     Ordered   08/17/20 0000  Change dressing on IV access line weekly and PRN  (Home infusion instructions - [facility] )        08/17/20 1327          Allergies  Allergen Reactions  . Ace Inhibitors Swelling and Anaphylaxis  . Ativan [Lorazepam] Other (See Comments)    Reaction:Hallucinations and headaches  . Compazine [Prochlorperazine Edisylate] Anaphylaxis, Nausea And Vomiting and Other (See Comments)    Other reaction(s): dystonia from this vs. Reglan, 23 Jul - patient relates that she takes promethazine frequently with no problems  . Sumatriptan Succinate Other (See Comments)    Other reaction(s): delirium and hallucinations per [facility] records  . Zofran [Ondansetron] Nausea And Vomiting    Per pt. she is allergic to zofran or will experience adverse reaction like hallucination   . Losartan Nausea Only  . Prochlorperazine Other (See  Comments)    Reaction:  Unknown . Patient does not remember reaction but she does have vertigo and anxiety along with n and v  at times. Could be used to treat any of these   . Reglan [Metoclopramide] Other (See Comments)    Per patient her Dr. Evelina Bucy her off it   . Scopolamine Other (See Comments)    Dizziness, also has vertigo already  . Tape Rash    Plastic tape causes rash  . Tapentadol Rash    Consultations:  ID  IR  neurosurg   Procedures/Studies: CT ANGIO CHEST PE W OR WO CONTRAST  Addendum Date: 08/10/2020   ADDENDUM REPORT: 08/10/2020 22:32 ADDENDUM: Critical Value/emergent results were called by telephone at the time of addendum submission on 08/10/2020 at 10:32 pm to provider NP Randol Kern, who verbally acknowledged these results. Electronically Signed   By: Lovena Le M.D.   On: 08/10/2020 22:32   Result Date: 08/10/2020 CLINICAL DATA:  PE suspected, history of ESRD, CHF, CAD, and renal cell carcinoma EXAM: CT ANGIOGRAPHY CHEST WITH CONTRAST TECHNIQUE: Multidetector CT imaging of the chest was performed using the standard protocol during bolus administration of intravenous contrast. Multiplanar CT image reconstructions and MIPs were obtained to evaluate the vascular anatomy. CONTRAST:  45m OMNIPAQUE IOHEXOL 350 MG/ML SOLN COMPARISON:  CT 06/11/2020 FINDINGS: Cardiovascular: Satisfactory opacification the pulmonary arteries to the segmental level. No central pulmonary artery filling defects are identified. Central pulmonary arteries are enlarged. May reflect some chronic pulmonary artery hypertension given similar to prior. Cardiomegaly with four-chamber enlargement. Small volume pericardial effusion and fluid in the pericardial recesses. Suboptimal opacification of the aorta for luminal assessment. Atherosclerotic plaque within the normal caliber aorta. No hyperdense mural thickening or plaque displacement. No clear acute luminal abnormality is seen. No significant focal  periaortic stranding or hemorrhage. Contrast bolus is administered via the right upper extremity cephalic vein. Irregularity of the subclavian vein as it passes through the thoracic inlet on the right. Extensive reflux of contrast throughout the right upper extremity possibly related to some degree of outlet obstruction at this level. Non opacification of a right axillary venous stent as well as a left subclavian stent through which traverses a dual lumen left subclavian approach dialysis catheter terminating at the right atrium. Mediastinum/Nodes: Edematous changes of the mediastinum, similar to increased from prior. Scattered enlarged mediastinal and hilar lymph nodes are again seen, overall burden quite similar to the comparison study with larger nodes including a 15 mm AP window node on the left (4/24) and a 15 mm precarinal lymph node (4/29). A 14 mm right hilar node is present (4/36) as well. Some prominent though nonenlarged axillary nodes are seen bilaterally. No acute abnormality of the trachea. Some mild nonspecific esophageal thickening is noted. Thyroid gland is unremarkable. Lungs/Pleura: Significant redistribution of the pulmonary vascularity with diffuse interlobular septal thickening and fissural thickening with bilateral pleural effusions, increasing from the comparison exam in now tracking within the fissures. Some adjacent areas of likely passive atelectasis are seen. No pneumothorax. Upper Abdomen: Edematous changes in the upper abdomen. No other acute or significant upper abdominal findings. Vascular calcifications in the abdomen as well. Musculoskeletal: Extensive severe body wall edema. Reflux of contrast into the right upper quadrant as above. Progressive sclerotic and destructive features along the anterior T7-T8 disc space with some adjacent paravertebral soft tissue thickening highly concerning for developing osteomyelitis with paravertebral abscess/phlegmon. Chronic fusion across the  T11-12 vertebral bodies. Background of more diffuse mild degenerative changes. No other acute or worrisome osseous features. No suspicious lytic or blastic lesions seen elsewhere. Review of the MIP images confirms the above findings. IMPRESSION: 1. No  evidence of central pulmonary artery filling defects to suggest acute pulmonary embolism. 2. Progressive sclerotic and destructive features along the anterior T7-T8 disc space with some adjacent paravertebral soft tissue thickening highly concerning for developing osteomyelitis with paravertebral abscess/phlegmon. Recommend further evaluation with MRI. 3. Extensive reflux of contrast throughout the right upper extremity possibly related to some degree of outlet obstruction or chronic occlusions of the stented right axillary and left subclavian veins. Left subclavian dialysis catheter tip terminates at the right atrium. 4. Cardiomegaly with four-chamber enlargement. Significant redistribution of the pulmonary vascularity with diffuse interlobular septal thickening and fissural thickening with bilateral pleural effusions, increasing from the comparison exam in now tracking within the fissures. Findings are favored to represent CHF with pulmonary edema. 5. Enlarged central pulmonary arteries may reflect some chronic pulmonary artery hypertension given similar to prior. 6. Scattered enlarged mediastinal and hilar lymph nodes, overall burden quite similar to the comparison study. May reflect a reactive etiology. 7. Aortic Atherosclerosis (ICD10-I70.0). Electronically Signed: By: Lovena Le M.D. On: 08/10/2020 22:23   MR THORACIC SPINE WO CONTRAST  Result Date: 08/11/2020 CLINICAL DATA:  Initial evaluation for possible osteomyelitis. EXAM: MRI THORACIC SPINE WITHOUT CONTRAST TECHNIQUE: Multiplanar, multisequence MR imaging of the thoracic spine was performed. No intravenous contrast was administered. COMPARISON:  Prior CT from 10/11/2019 FINDINGS: Alignment:  Physiologic with preservation of the normal thoracic kyphosis. No listhesis. Vertebrae: Vertebral body height maintained without acute or chronic fracture. Chronic endplate Schmorl's node deformity noted at the superior endplate of L2. T11 and T12 vertebral bodies are partially ankylosed anteriorly. Intervertebral disc space narrowing with increased fluid signal intensity seen within the T7-8 interspace anteriorly. Mild associated endplate irregularity within the adjacent T7 and T8 endplates. Abnormal edema within the T7 and T8 vertebral bodies. Possible minimal edema within the adjacent paraspinous soft tissues. While these findings are somewhat nonspecific, appearance is most concerning for possible osteomyelitis discitis. No discrete paraspinous collection. No epidural abscess or other collection identified. No other evidence for acute infection within the thoracic spine. Cord:  Normal signal and morphology.  No epidural collections. Paraspinal and other soft tissues: Possible minimal paraspinous edema adjacent to the T7-8 interspace. Paraspinous soft tissues demonstrate no other acute finding. Irregular bilateral pleural effusions noted, better evaluated on prior CT. Disc levels: Normal expected multilevel disc desiccation seen throughout the thoracic spine for age. Chronic endplate Schmorl's node deformity without edema at the superior endplate of L2. No significant disc bulge or focal disc herniation. No significant spinal stenosis. Foramina remain patent. IMPRESSION: 1. Findings concerning for possible osteomyelitis discitis at the T7-8 interspace. Correlation with symptomatology and laboratory values recommended. No epidural abscess or other collection identified. 2. Irregular bilateral pleural effusions, better evaluated on prior CT. Electronically Signed   By: Jeannine Boga M.D.   On: 08/11/2020 03:33   US Venous Img Upper Uni Right(DVT)  Result Date: 08/11/2020 CLINICAL DATA:  Dialysis  patient, previous upper extremity access sites, venous stenting EXAM: RIGHT UPPER EXTREMITY VENOUS DOPPLER ULTRASOUND TECHNIQUE: Gray-scale sonography with graded compression, as well as color Doppler and duplex ultrasound were performed to evaluate the upper extremity deep venous system from the level of the subclavian vein and including the jugular, axillary, basilic, radial, ulnar and upper cephalic vein. Spectral Doppler was utilized to evaluate flow at rest and with distal augmentation maneuvers. COMPARISON:  None. FINDINGS: Contralateral Subclavian Vein: Respiratory phasicity is normal and symmetric with the symptomatic side. No evidence of thrombus. Normal compressibility. Internal Jugular Vein: No evidence of  thrombus. Normal compressibility, respiratory phasicity and response to augmentation. Subclavian Vein: No evidence of thrombus. Normal compressibility, respiratory phasicity and response to augmentation. Axillary Vein: No evidence of thrombus. Normal compressibility, respiratory phasicity and response to augmentation. Cephalic Vein: Not visualized related to prior dialysis access. Suspect occluded. Basilic Vein: No evidence of thrombus. Normal compressibility, respiratory phasicity and response to augmentation. Brachial Veins: No evidence of thrombus. Normal compressibility, respiratory phasicity and response to augmentation. Radial Veins: No evidence of thrombus. Normal compressibility, respiratory phasicity and response to augmentation. Ulnar Veins: No evidence of thrombus. Normal compressibility, respiratory phasicity and response to augmentation. Other Findings: Old right upper extremity dialysis access is occluded. IMPRESSION: No significant acute right upper extremity occlusive DVT. Electronically Signed   By: Jerilynn Mages.  Shick M.D.   On: 08/11/2020 10:51   DG Chest Portable 1 View  Result Date: 08/10/2020 CLINICAL DATA:  Chest pain shortness of breath, history of asthma. EXAM: PORTABLE CHEST 1 VIEW  COMPARISON:  CT the abdomen and pelvis from September of 2021 and CT of the chest also from September of 2021 FINDINGS: Signs of LEFT axillary and subclavian venous stenting similar to the prior exam. Stenting also in the RIGHT mid arm. Interval removal of RIGHT-sided central venous access device and placement of a LEFT-sided dialysis catheter, tip in the mid to low RIGHT atrium relative to the longest lumen. Trachea midline, air column normal. Cardiomediastinal contours with enlargement. Hilar structures with engorgement of central pulmonary vasculature with increased interstitial markings bilaterally. Blunting of LEFT costodiaphragmatic sulcus in patchy basilar airspace opacities. On limited assessment no acute skeletal process. IMPRESSION: 1. Findings consistent with volume overload/CHF. Difficult to exclude the possibility of concomitant infection particularly in the LEFT lung base. 2. New LEFT-sided dialysis catheter, tip in the mid to low RIGHT atrium. Electronically Signed   By: Zetta Bills M.D.   On: 08/10/2020 10:17   IR INJECT DIAG/THERA/INC NEEDLE/CATH/PLC EPI/CERV/THOR W/IMG  Result Date: 08/13/2020 INDICATION: Concern for discitis/osteomyelitis involving T7-T8. Please perform fluoroscopic guided aspiration for diagnostic purposes. EXAM: IR INJECT/THERA/INC NEEDLE/CATH/PLC EPI/CERV/THOR W/IMG COMPARISON:  Thoracic spine MRI-08/11/2020; chest CT-08/10/2020 MEDICATIONS: None ANESTHESIA/SEDATION: Moderate (conscious) sedation was employed during this procedure. A total of Fentanyl 50 mcg Versed 1 mg was administered intravenously. Moderate Sedation Time: 20 minutes. The patient's level of consciousness and vital signs were monitored continuously by radiology nursing throughout the procedure under my direct supervision. CONTRAST:  None FLUOROSCOPY TIME:  1 minute, 48 seconds (51.7 mGy) COMPLICATIONS: None immediate. PROCEDURE: Informed written consent was obtained from the patient after a discussion  of the risks, benefits and alternatives to treatment. A timeout was performed prior to the initiation of the procedure. The patient was positioned prone on the fluoroscopy table. The T7-T8 intervertebral disc space was marked fluoroscopically. Utilizing an oblique, left-sided approach, a 17 gauge trocar was advanced into the targeted intervertebral disc. Needle positioning within the left posterolateral aspect of the T7-T8 intervertebral disc was confirmed with the fluoroscopic imaging in both the anterior and lateral projections. Multiple spot fluoroscopic images were saved for procedural documentation purposes. With needle positioning confirmed attempts were made to perform aspiration, however no fluid was able to be aspirated. As such approximately 2 cc saline was administered and subsequently aspirated yielding approximately 1 cc blood tinged fluid. All aspirated fluid was capped and sent to the laboratory analysis. The needle was removed and superficial hemostasis was achieved with manual compression. A dressing was applied. The patient tolerated the procedure well without immediate postprocedural complication. IMPRESSION: Technically  successful fluoroscopic guided T7-T8 disc lavage aspiration. Electronically Signed   By: Sandi Mariscal M.D.   On: 08/13/2020 14:40       Subjective: pt c/o frustration of having to stay in the hospital an extra day    Discharge Exam: Vitals:   08/17/20 1130 08/17/20 1315  BP: (!) 143/61 (!) (P) 143/60  Pulse: 62   Resp: 11   Temp:    SpO2: 98%    Vitals:   08/17/20 1100 08/17/20 1115 08/17/20 1130 08/17/20 1315  BP: (!) 142/59 133/64 (!) 143/61 (!) (P) 143/60  Pulse: 60 60 62   Resp: 19 13 11    Temp:      TempSrc:      SpO2: 97% 97% 98%   Weight:      Height:        General: Pt is alert, awake, not in acute distress Cardiovascular: S1/S2 +, no rubs, no gallops Respiratory: diminished breath sounds b/l otherwise clear  Abdominal: Soft, NT, ND, bowel  sounds + Extremities:  no cyanosis    The results of significant diagnostics from this hospitalization (including imaging, microbiology, ancillary and laboratory) are listed below for reference.     Microbiology: Recent Results (from the past 240 hour(s))  Resp Panel by RT-PCR (Flu A&B, Covid) Nasopharyngeal Swab     Status: None   Collection Time: 08/10/20 11:20 AM   Specimen: Nasopharyngeal Swab; Nasopharyngeal(NP) swabs in vial transport medium  Result Value Ref Range Status   SARS Coronavirus 2 by RT PCR NEGATIVE NEGATIVE Final    Comment: (NOTE) SARS-CoV-2 target nucleic acids are NOT DETECTED.  The SARS-CoV-2 RNA is generally detectable in upper respiratory specimens during the acute phase of infection. The lowest concentration of SARS-CoV-2 viral copies this assay can detect is 138 copies/mL. A negative result does not preclude SARS-Cov-2 infection and should not be used as the sole basis for treatment or other patient management decisions. A negative result may occur with  improper specimen collection/handling, submission of specimen other than nasopharyngeal swab, presence of viral mutation(s) within the areas targeted by this assay, and inadequate number of viral copies(<138 copies/mL). A negative result must be combined with clinical observations, patient history, and epidemiological information. The expected result is Negative.  Fact Sheet for Patients:  EntrepreneurPulse.com.au  Fact Sheet for Healthcare Providers:  IncredibleEmployment.be  This test is no t yet approved or cleared by the [place] FDA and  has been authorized for detection and/or diagnosis of SARS-CoV-2 by FDA under an Emergency Use Authorization (EUA). This EUA will remain  in effect (meaning this test can be used) for the duration of the COVID-19 declaration under Section 564(b)(1) of the Act, 21 U.S.C.section 360bbb-3(b)(1), unless the authorization is  terminated  or revoked sooner.       Influenza A by PCR NEGATIVE NEGATIVE Final   Influenza B by PCR NEGATIVE NEGATIVE Final    Comment: (NOTE) The Xpert Xpress SARS-CoV-2/FLU/RSV plus assay is intended as an aid in the diagnosis of influenza from Nasopharyngeal swab specimens and should not be used as a sole basis for treatment. Nasal washings and aspirates are unacceptable for Xpert Xpress SARS-CoV-2/FLU/RSV testing.  Fact Sheet for Patients: EntrepreneurPulse.com.au  Fact Sheet for Healthcare Providers: IncredibleEmployment.be  This test is not yet approved or cleared by the [place] FDA and has been authorized for detection and/or diagnosis of SARS-CoV-2 by FDA under an Emergency Use Authorization (EUA). This EUA will remain in effect (meaning this test can be used)  for the duration of the COVID-19 declaration under Section 564(b)(1) of the Act, 21 U.S.C. section 360bbb-3(b)(1), unless the authorization is terminated or revoked.  Performed at [facility], [address] [place], [place] [address]   MRSA PCR Screening     Status: None   Collection Time: 08/10/20 10:12 PM   Specimen: Nasal Mucosa; Nasopharyngeal  Result Value Ref Range Status   MRSA by PCR NEGATIVE NEGATIVE Final    Comment:        The GeneXpert MRSA Assay (FDA approved for NASAL specimens only), is one component of a comprehensive MRSA colonization surveillance program. It is not intended to diagnose MRSA infection nor to guide or monitor treatment for MRSA infections. Performed at [facility], [address] [place], [place] [address]   Culture, blood (Routine X 2) w Reflex to ID Panel     Status: None   Collection Time: 08/11/20 12:51 AM   Specimen: BLOOD  Result Value Ref Range Status   Specimen Description BLOOD BLOOD LEFT HAND  Final   Special Requests   Final    BOTTLES DRAWN AEROBIC ONLY Blood Culture results may not be  optimal due to an inadequate volume of blood received in culture bottles   Culture   Final    NO GROWTH 5 DAYS Performed at [facility], [address] [place], [place] [address]    Report Status 08/16/2020 FINAL  Final  Culture, blood (Routine X 2) w Reflex to ID Panel     Status: None   Collection Time: 08/11/20  1:03 AM   Specimen: BLOOD  Result Value Ref Range Status   Specimen Description BLOOD BLOOD LEFT HAND  Final   Special Requests   Final    BOTTLES DRAWN AEROBIC AND ANAEROBIC Blood Culture results may not be optimal due to an inadequate volume of blood received in culture bottles   Culture   Final    NO GROWTH 5 DAYS Performed at [facility], [address], [place], [place] [address]    Report Status 08/16/2020 FINAL  Final  Aerobic/Anaerobic Culture (surgical/deep wound)     Status: None (Preliminary result)   Collection Time: 08/13/20  2:08 PM   Specimen: Wound; Abscess  Result Value Ref Range Status   Specimen Description   Final    WOUND Performed at [facility], [address], [place], [place] [address]    Special Requests   Final    [place] Performed at [facility], [place], [place] [address]    Gram Stain NO WBC SEEN NO ORGANISMS SEEN   Final   Culture   Final    NO GROWTH 4 DAYS NO ANAEROBES ISOLATED; CULTURE IN PROGRESS FOR 5 DAYS Performed at [place] [address] [place], [place] [address]    Report Status PENDING  Incomplete     Labs: BNP (last 3 results) Recent Labs    05/22/20 0840  BNP 536.4*   Basic Metabolic Panel: Recent Labs  Lab 08/11/20 0110 08/12/20 0425 08/13/20 1711 08/15/20 0900 08/17/20 1034  NA 137 135 136  134* 134* 133*  K 3.3* 3.9 4.3  4.2 3.3* 4.1  CL 99 95* 96*  99 93* 94*  CO2 29 29 30  31 31 29   GLUCOSE 64* 101* 65*  66* 97 103*  BUN 6* 10 16  16 8 12   CREATININE 3.06* 4.28* 5.64*  5.62* 2.65* 3.61*  CALCIUM 7.5* 8.2*  8.3*  7.8*  8.0* 8.3*  MG 1.8  --   --   --   --   PHOS 2.4*  --  4.0 1.5* 1.9*   Liver Function Tests: Recent Labs  Lab 08/12/20 0425 08/13/20 1711 08/15/20 0900 08/17/20 1034  AST 15 15  --   --   ALT 9 9  --   --   ALKPHOS 70 78  --   --   BILITOT 0.5 0.3  --   --   PROT 6.0* 6.8  --   --   ALBUMIN 2.3* 2.5*  2.5* 2.4* 2.3*   No results for input(s): LIPASE, AMYLASE in the last 168 hours. No results for input(s): AMMONIA in the last 168 hours. CBC: Recent Labs  Lab 08/11/20 0110 08/12/20 0425 08/13/20 1711 08/15/20 0900 08/17/20 1034  WBC 3.5* 4.0 6.2 4.2 3.5*  NEUTROABS  --  1.1* 2.4  --  1.6*  HGB 7.1* 6.9* 8.8* 8.0* 8.6*  HCT 22.1* 22.1* 27.5* 24.6* 26.2*  MCV 89.1 90.2 87.9 86.9 86.2  PLT 107* 115* 161 114* 141*   Cardiac Enzymes: No results for input(s): CKTOTAL, CKMB, CKMBINDEX, TROPONINI in the last 168 hours. BNP: Invalid input(s): POCBNP CBG: Recent Labs  Lab 08/16/20 2044 08/16/20 2339 08/17/20 0405 08/17/20 0858 08/17/20 0953  GLUCAP 127* 85 113* 57* 114*   D-Dimer No results for input(s): DDIMER in the last 72 hours. Hgb A1c No results for input(s): HGBA1C in the last 72 hours. Lipid Profile No results for input(s): CHOL, HDL, LDLCALC, TRIG, CHOLHDL, LDLDIRECT in the last 72 hours. Thyroid function studies No results for input(s): TSH, T4TOTAL, T3FREE, THYROIDAB in the last 72 hours.  Invalid input(s): FREET3 Anemia work up No results for input(s): VITAMINB12, FOLATE, FERRITIN, TIBC, IRON, RETICCTPCT in the last 72 hours. Urinalysis    Component Value Date/Time   COLORURINE YELLOW (A) 06/09/2017 1039   APPEARANCEUR CLEAR (A) 06/09/2017 1039   APPEARANCEUR Clear 09/15/2014 0947   LABSPEC 1.014 06/09/2017 1039   LABSPEC 1.008 09/15/2014 0947   PHURINE 8.0 06/09/2017 1039   GLUCOSEU 150 (A) 06/09/2017 1039   GLUCOSEU 150 mg/dL 09/15/2014 0947   HGBUR SMALL (A) 06/09/2017 1039   BILIRUBINUR NEGATIVE 06/09/2017 1039   BILIRUBINUR Negative  09/15/2014 0947   KETONESUR NEGATIVE 06/09/2017 1039   PROTEINUR >=300 (A) 06/09/2017 1039   UROBILINOGEN 0.2 06/30/2010 2130   NITRITE NEGATIVE 06/09/2017 1039   LEUKOCYTESUR NEGATIVE 06/09/2017 1039   LEUKOCYTESUR Negative 09/15/2014 0947   Sepsis Labs Invalid input(s): PROCALCITONIN,  WBC,  LACTICIDVEN Microbiology Recent Results (from the past 240 hour(s))  Resp Panel by RT-PCR (Flu A&B, Covid) Nasopharyngeal Swab     Status: None   Collection Time: 08/10/20 11:20 AM   Specimen: Nasopharyngeal Swab; Nasopharyngeal(NP) swabs in vial transport medium  Result Value Ref Range Status   SARS Coronavirus 2 by RT PCR NEGATIVE NEGATIVE Final    Comment: (NOTE) SARS-CoV-2 target nucleic acids are NOT DETECTED.  The SARS-CoV-2 RNA is generally detectable in upper respiratory specimens during the acute phase of infection. The lowest concentration of SARS-CoV-2 viral copies this assay can detect is 138 copies/mL. A negative result does not preclude SARS-Cov-2 infection and should not be used as the sole basis for treatment or other patient management decisions. A negative result may occur with  improper specimen collection/handling, submission of specimen other than nasopharyngeal swab, presence of viral mutation(s) within the areas targeted by this assay, and inadequate number of viral copies(<138 copies/mL).  A negative result must be combined with clinical observations, patient history, and epidemiological information. The expected result is Negative.  Fact Sheet for Patients:  EntrepreneurPulse.com.au  Fact Sheet for Healthcare Providers:  IncredibleEmployment.be  This test is no t yet approved or cleared by the [place] FDA and  has been authorized for detection and/or diagnosis of SARS-CoV-2 by FDA under an Emergency Use Authorization (EUA). This EUA will remain  in effect (meaning this test can be used) for the duration of the COVID-19  declaration under Section 564(b)(1) of the Act, 21 U.S.C.section 360bbb-3(b)(1), unless the authorization is terminated  or revoked sooner.       Influenza A by PCR NEGATIVE NEGATIVE Final   Influenza B by PCR NEGATIVE NEGATIVE Final    Comment: (NOTE) The Xpert Xpress SARS-CoV-2/FLU/RSV plus assay is intended as an aid in the diagnosis of influenza from Nasopharyngeal swab specimens and should not be used as a sole basis for treatment. Nasal washings and aspirates are unacceptable for Xpert Xpress SARS-CoV-2/FLU/RSV testing.  Fact Sheet for Patients: EntrepreneurPulse.com.au  Fact Sheet for Healthcare Providers: IncredibleEmployment.be  This test is not yet approved or cleared by the [place] FDA and has been authorized for detection and/or diagnosis of SARS-CoV-2 by FDA under an Emergency Use Authorization (EUA). This EUA will remain in effect (meaning this test can be used) for the duration of the COVID-19 declaration under Section 564(b)(1) of the Act, 21 U.S.C. section 360bbb-3(b)(1), unless the authorization is terminated or revoked.  Performed at [facility], [address] [place], [place] [address]   MRSA PCR Screening     Status: None   Collection Time: 08/10/20 10:12 PM   Specimen: Nasal Mucosa; Nasopharyngeal  Result Value Ref Range Status   MRSA by PCR NEGATIVE NEGATIVE Final    Comment:        The GeneXpert MRSA Assay (FDA approved for NASAL specimens only), is one component of a comprehensive MRSA colonization surveillance program. It is not intended to diagnose MRSA infection nor to guide or monitor treatment for MRSA infections. Performed at [facility], [address] [place], [place] [address]   Culture, blood (Routine X 2) w Reflex to ID Panel     Status: None   Collection Time: 08/11/20 12:51 AM   Specimen: BLOOD  Result Value Ref Range Status   Specimen Description BLOOD BLOOD  LEFT HAND  Final   Special Requests   Final    BOTTLES DRAWN AEROBIC ONLY Blood Culture results may not be optimal due to an inadequate volume of blood received in culture bottles   Culture   Final    NO GROWTH 5 DAYS Performed at [facility], [address] [place], [place] [address]    Report Status 08/16/2020 FINAL  Final  Culture, blood (Routine X 2) w Reflex to ID Panel     Status: None   Collection Time: 08/11/20  1:03 AM   Specimen: BLOOD  Result Value Ref Range Status   Specimen Description BLOOD BLOOD LEFT HAND  Final   Special Requests   Final    BOTTLES DRAWN AEROBIC AND ANAEROBIC Blood Culture results may not be optimal due to an inadequate volume of blood received in culture bottles   Culture   Final    NO GROWTH 5 DAYS Performed at [facility], [address] [place], [place] [address]    Report Status 08/16/2020 FINAL  Final  Aerobic/Anaerobic Culture (surgical/deep wound)  Status: None (Preliminary result)   Collection Time: 08/13/20  2:08 PM   Specimen: Wound; Abscess  Result Value Ref Range Status   Specimen Description   Final    WOUND Performed at [facility], [address] [place], [place] [address]    Special Requests   Final    [place] Performed at [facility], [place], [place] [address]    Gram Stain NO WBC SEEN NO ORGANISMS SEEN   Final   Culture   Final    NO GROWTH 4 DAYS NO ANAEROBES ISOLATED; CULTURE IN PROGRESS FOR 5 DAYS Performed at [place] [address], [place], [place] [address]    Report Status PENDING  Incomplete     Time coordinating discharge: Over 30 minutes  SIGNED:   Wyvonnia Dusky, MD  [facility] 08/17/2020, 1:36 PM Pager   If 7PM-7AM, please contact night-coverage

## 2020-08-17 NOTE — Progress Notes (Signed)
[place] received page to assist pt. w/AD completion; [facility] brought pt. AD earlier today but pt. thought it needed to be completed before discharge.  CH explained pt. can take completed form to bank post-discharge and have it notarized there.  Pt. says her son has been unwilling to discuss her wishes for end of life because 'he's afraid to lose me', but she says son has been a major source of support to her.  He lives close to her home where she lives alone and pt. says son manages her affairs.  Pt. had completed the AD, naming her son as MPOA and indicating her wishes to not have life-prolonging treatment continued in any of the three situations described in Living Will.  Pt. made clear her distaste for the idea of ending up in a nursing home, especially in a state where she no longer was in her right mind.  Pt. requested prayer at end of visit.  CH remains available as needed.

## 2020-08-17 NOTE — Progress Notes (Addendum)
   08/17/20 0920  Clinical Encounter Type  Visited With Patient  Visit Type Follow-up  Referral From Chaplain  Consult/Referral To Chaplain  Chaplain visited with Pt to educate her on how to complete an AD and what completing one means. Chaplain thought Pt didn't want her involved per the request, but Pt said he is the one who helps with making all of her decisions. Pt told chaplain that she wants to be cremated and she did not want a lot of flowers. Pt loves taking care of her flowers and plants and prefers to have her flowers now. Pt says she loves the mountains and her son drives her through mountains in [place]. Pt does not want a funeral, but wants her ashes sifted over the mountains. Pt started crying while she was talking. She said her grandfather was a [place]. Pt shared that she has nine grandchildren ranching from age [age]. Pt said she doesn't have a lot, but she does have a ton of picturds (she seem happy when thinking about all the pictures she has. Chaplain assisted Pt with filling out AD, but explained, due to the holiday it will be hard to get witnesses today. Chaplain will ask a colleague to assist with completing form on Monday. Pt said ok. Chaplain encouraged her to keep smiling.

## 2020-08-17 NOTE — Progress Notes (Signed)
[facility]  ROUNDING NOTE   Subjective:   [name] is 59 years old female, well known to our practice,presented to the ED  on 08/10/2020, with worsening SOB. She also reported chest pain, which resolved prior to  ED arrival. SpO2 was 83% on arrival, currently 100% on 3L O2 via nasal cannula.Patient has h/o ESRD, on dialysis MWF.  Patient seen in dialysis today,receiving treatment.   HEMODIALYSIS FLOWSHEET:  Blood Flow Rate (mL/min): 300 mL/min Arterial Pressure (mmHg): -120 mmHg Venous Pressure (mmHg): 200 mmHg Transmembrane Pressure (mmHg): 80 mmHg Ultrafiltration Rate (mL/min): 840 mL/min Dialysate Flow Rate (mL/min): 600 ml/min Conductivity: Machine : 13.8 Conductivity: Machine : 13.8 Dialysis Fluid Bolus: Normal Saline Bolus Amount (mL): 200 mL   Objective:  Vital signs in last 24 hours:  Temp:  [97.7 F (36.5 C)-98.6 F (37 C)] 98.1 F (36.7 C) (11/26 1358) Pulse Rate:  [60-68] 66 (11/26 1358) Resp:  [10-19] 14 (11/26 1358) BP: (133-162)/(56-73) 147/59 (11/26 1358) SpO2:  [94 %-100 %] 96 % (11/26 1358) Weight:  [82 kg] 82 kg (11/26 0408)  Weight change: 2.7 kg Filed Weights   08/15/20 0500 08/16/20 0435 08/17/20 0408  Weight: 80.1 kg 79.3 kg 82 kg    Intake/Output: I/O last 3 completed shifts: In: 240 [P.O.:240] Out: -    Intake/Output this shift:  No intake/output data recorded.  Physical Exam: General:  Lying in bed, receiving dialysis treatment,appears pleasant and comfortable  Head: Oral mucous membranes moist  Eyes:  Sclerae and conjunctivae clear  Lungs:   lungs clear,normal respiratory effort  Heart: S1S2, no rubs or gallops  Abdomen:  Soft, nontender, nondistended  Extremities:  trace peripheral edema   Neurologic:  Awake,alert,oriented  Skin: No acute lesions or rashes  Access: Lt IJ catheter    Basic Metabolic Panel: Recent Labs  Lab 08/11/20 0110 08/11/20 0110 08/12/20 0425 08/12/20 0425 08/13/20 1711  08/15/20 0900 08/17/20 1034  NA 137  --  135  --  136  134* 134* 133*  K 3.3*  --  3.9  --  4.3  4.2 3.3* 4.1  CL 99  --  95*  --  96*  99 93* 94*  CO2 29  --  29  --  30  31 31 29   GLUCOSE 64*  --  101*  --  65*  66* 97 103*  BUN 6*  --  10  --  16  16 8 12   CREATININE 3.06*  --  4.28*  --  5.64*  5.62* 2.65* 3.61*  CALCIUM 7.5*   < > 8.2*   < > 8.3*  7.8* 8.0* 8.3*  MG 1.8  --   --   --   --   --   --   PHOS 2.4*  --   --   --  4.0 1.5* 1.9*   < > = values in this interval not displayed.    Liver Function Tests: Recent Labs  Lab 08/12/20 0425 08/13/20 1711 08/15/20 0900 08/17/20 1034  AST 15 15  --   --   ALT 9 9  --   --   ALKPHOS 70 78  --   --   BILITOT 0.5 0.3  --   --   PROT 6.0* 6.8  --   --   ALBUMIN 2.3* 2.5*  2.5* 2.4* 2.3*   No results for input(s): LIPASE, AMYLASE in the last 168 hours. No results for input(s): AMMONIA in the last 168 hours.  CBC:  Recent Labs  Lab 08/11/20 0110 08/12/20 0425 08/13/20 1711 08/15/20 0900 08/17/20 1034  WBC 3.5* 4.0 6.2 4.2 3.5*  NEUTROABS  --  1.1* 2.4  --  1.6*  HGB 7.1* 6.9* 8.8* 8.0* 8.6*  HCT 22.1* 22.1* 27.5* 24.6* 26.2*  MCV 89.1 90.2 87.9 86.9 86.2  PLT 107* 115* 161 114* 141*    Cardiac Enzymes: No results for input(s): CKTOTAL, CKMB, CKMBINDEX, TROPONINI in the last 168 hours.  BNP: Invalid input(s): POCBNP  CBG: Recent Labs  Lab 08/16/20 2339 08/17/20 0405 08/17/20 0858 08/17/20 0953 08/17/20 1359  GLUCAP 85 113* 57* 114* 66*    Microbiology: Results for orders placed or performed during the hospital encounter of 08/10/20  Resp Panel by RT-PCR (Flu A&B, Covid) Nasopharyngeal Swab     Status: None   Collection Time: 08/10/20 11:20 AM   Specimen: Nasopharyngeal Swab; Nasopharyngeal(NP) swabs in vial transport medium  Result Value Ref Range Status   SARS Coronavirus 2 by RT PCR NEGATIVE NEGATIVE Final    Comment: (NOTE) SARS-CoV-2 target nucleic acids are NOT DETECTED.  The  SARS-CoV-2 RNA is generally detectable in upper respiratory specimens during the acute phase of infection. The lowest concentration of SARS-CoV-2 viral copies this assay can detect is 138 copies/mL. A negative result does not preclude SARS-Cov-2 infection and should not be used as the sole basis for treatment or other patient management decisions. A negative result may occur with  improper specimen collection/handling, submission of specimen other than nasopharyngeal swab, presence of viral mutation(s) within the areas targeted by this assay, and inadequate number of viral copies(<138 copies/mL). A negative result must be combined with clinical observations, patient history, and epidemiological information. The expected result is Negative.  Fact Sheet for Patients:  EntrepreneurPulse.com.au  Fact Sheet for Healthcare Providers:  IncredibleEmployment.be  This test is no t yet approved or cleared by the [place] FDA and  has been authorized for detection and/or diagnosis of SARS-CoV-2 by FDA under an Emergency Use Authorization (EUA). This EUA will remain  in effect (meaning this test can be used) for the duration of the COVID-19 declaration under Section 564(b)(1) of the Act, 21 U.S.C.section 360bbb-3(b)(1), unless the authorization is terminated  or revoked sooner.       Influenza A by PCR NEGATIVE NEGATIVE Final   Influenza B by PCR NEGATIVE NEGATIVE Final    Comment: (NOTE) The Xpert Xpress SARS-CoV-2/FLU/RSV plus assay is intended as an aid in the diagnosis of influenza from Nasopharyngeal swab specimens and should not be used as a sole basis for treatment. Nasal washings and aspirates are unacceptable for Xpert Xpress SARS-CoV-2/FLU/RSV testing.  Fact Sheet for Patients: EntrepreneurPulse.com.au  Fact Sheet for Healthcare Providers: IncredibleEmployment.be  This test is not yet approved or  cleared by the [place] FDA and has been authorized for detection and/or diagnosis of SARS-CoV-2 by FDA under an Emergency Use Authorization (EUA). This EUA will remain in effect (meaning this test can be used) for the duration of the COVID-19 declaration under Section 564(b)(1) of the Act, 21 U.S.C. section 360bbb-3(b)(1), unless the authorization is terminated or revoked.  Performed at [facility], [address] [place], [place] [address]   MRSA PCR Screening     Status: None   Collection Time: 08/10/20 10:12 PM   Specimen: Nasal Mucosa; Nasopharyngeal  Result Value Ref Range Status   MRSA by PCR NEGATIVE NEGATIVE Final    Comment:        The GeneXpert MRSA Assay (FDA  approved for NASAL specimens only), is one component of a comprehensive MRSA colonization surveillance program. It is not intended to diagnose MRSA infection nor to guide or monitor treatment for MRSA infections. Performed at [facility], [address] [place], [place] [address]   Culture, blood (Routine X 2) w Reflex to ID Panel     Status: None   Collection Time: 08/11/20 12:51 AM   Specimen: BLOOD  Result Value Ref Range Status   Specimen Description BLOOD BLOOD LEFT HAND  Final   Special Requests   Final    BOTTLES DRAWN AEROBIC ONLY Blood Culture results may not be optimal due to an inadequate volume of blood received in culture bottles   Culture   Final    NO GROWTH 5 DAYS Performed at [facility], [address] [place], [place] [address]    Report Status 08/16/2020 FINAL  Final  Culture, blood (Routine X 2) w Reflex to ID Panel     Status: None   Collection Time: 08/11/20  1:03 AM   Specimen: BLOOD  Result Value Ref Range Status   Specimen Description BLOOD BLOOD LEFT HAND  Final   Special Requests   Final    BOTTLES DRAWN AEROBIC AND ANAEROBIC Blood Culture results may not be optimal due to an inadequate volume of blood received in culture bottles    Culture   Final    NO GROWTH 5 DAYS Performed at [facility], [address] [place], [place] [address]    Report Status 08/16/2020 FINAL  Final  Aerobic/Anaerobic Culture (surgical/deep wound)     Status: None (Preliminary result)   Collection Time: 08/13/20  2:08 PM   Specimen: Wound; Abscess  Result Value Ref Range Status   Specimen Description   Final    WOUND Performed at [facility], [address], [place], [place] [address]    Special Requests   Final    [place] Performed at [facility], [place], [place] [address]    Gram Stain NO WBC SEEN NO ORGANISMS SEEN   Final   Culture   Final    NO GROWTH 4 DAYS NO ANAEROBES ISOLATED; CULTURE IN PROGRESS FOR 5 DAYS Performed at [facility] [address] [place],  [address]    Report Status PENDING  Incomplete    Coagulation Studies: No results for input(s): LABPROT, INR in the last 72 hours.  Urinalysis: No results for input(s): COLORURINE, LABSPEC, PHURINE, GLUCOSEU, HGBUR, BILIRUBINUR, KETONESUR, PROTEINUR, UROBILINOGEN, NITRITE, LEUKOCYTESUR in the last 72 hours.  Invalid input(s): APPERANCEUR    Imaging: No results found.   Medications:   . sodium chloride Stopped (08/15/20 1825)  . cefTAZidime (FORTAZ)  IV    . vancomycin 1,000 mg (08/17/20 1211)   . sodium chloride   Intravenous Once  . acetaminophen  650 mg Oral Once  . amLODipine  10 mg Oral Daily  . ascorbic acid  500 mg Oral Q breakfast  . atorvastatin  20 mg Oral QPM  . carvedilol  3.125 mg Oral BID WC  . Chlorhexidine Gluconate Cloth  6 each Topical Q0600  . cholecalciferol  1,000 Units Oral Daily  . cholestyramine light  4 g Oral BID  . epoetin (EPOGEN/PROCRIT) injection  8,000 Units Intravenous Q M,W,F-HD  . feeding supplement  237 mL Oral Q24H  . ferrous sulfate  325 mg Oral Q breakfast  . furosemide  80 mg Oral Daily  . gabapentin  100 mg Oral QPM  . hydrALAZINE  50 mg Oral Q8H   . hydrOXYzine  25 mg Oral Daily  . lipase/protease/amylase  36,000 Units Oral TID AC  . multivitamin  1 tablet Oral Daily  . omega-3 acid ethyl esters  1 capsule Oral BID  . pantoprazole (PROTONIX) IV  40 mg Intravenous Q12H  . saccharomyces boulardii  250 mg Oral Daily  . sodium chloride flush  3 mL Intravenous Q12H  . cyanocobalamin  1,000 mcg Oral Daily   sodium chloride, acetaminophen, albuterol, alum & mag hydroxide-simeth, ammonium lactate, calcium carbonate, dextromethorphan-guaiFENesin, dextrose, diphenhydrAMINE, hydrALAZINE, HYDROcodone-acetaminophen, morphine injection, mupirocin ointment, nitroGLYCERIN, promethazine, promethazine  Assessment/ Plan:  [name] is a 59 y.o.  female well known to our practice,presented to the ED, with worsening SOB. She also reported chest pain, which resolved prior to  ED arrival. SpO2 was 83% on arrival, currently 100% on 3L O2 via nasal cannula. Patient has h/o ESRD, on dialysis MWF. We will arrange dialysis for her today.Orders placed.  #ESRD on dialysis MWF Receiving dialysis treatment today Tolerating well Will continue MWF schedule  # Hypertension BP 143/60 Continue current antihypertensive regimen  #Secondary Hyperparathyroidism Lab Results  Component Value Date   PTH 357 (H) 10/05/2018   CALCIUM 8.3 (L) 08/17/2020   CAION 1.09 (L) 04/13/2020   PHOS 1.9 (L) 08/17/2020    # Acute on chronic diastolic CHF Denies worsening SOB Patient is on room air  #Anemia of Chronic Kidney Disease Lab Results  Component Value Date   HGB 8.6 (L) 08/17/2020  Will continue Epogen 8000 units with HD      LOS: 6 Djimon Lundstrom 11/26/20212:09 PM

## 2020-08-17 NOTE — Progress Notes (Signed)
Patient tolerated HD treatment today without any issues or concerns. V/S remained stable throughout the treatment. Treatment time x 3 hrs, Total UF=254ml. Vancomycin 1G administered the last hour of treatment with patient tolerating well.

## 2020-08-17 NOTE — Progress Notes (Signed)
Pharmacy Antibiotic Note  [name] is a 59 y.o. female admitted on 08/10/2020 with vertebral osteomyelitis.  Pharmacy has been consulted for vancomycin and ceftazidime dosing. MRI reveals possible T7-8 osteomyelitis, she is s/p aspiration by IR.  She is on HD MWF.   Per Dr. Jimmye Norman request yesterday for pharmacy, Vancomycin and Tressie Ellis will be held until after the procedure. Therefore, both antibiotics were discontinued for the time being. Per notes, repeat aspiration is planned for today. However, D/w Dr. Jimmye Norman during rounds, and there will be no aspiration per IR.   Afebrile WBC WNL Blood culture: NGTD Disc Aspiration: NGTD (no organisms or WBC seen)   Plan:  Vancomycin 1gm IV qHD on MWF  Check pre-HD level prior to 4-5th dose  Ceftazidime 1gm IV qHD (MWF)  Height: 5\' 5"  (165.1 cm) Weight: 82 kg (180 lb 12.4 oz) IBW/kg (Calculated) : 57  Temp (24hrs), Avg:98.1 F (36.7 C), Min:97.7 F (36.5 C), Max:98.6 F (37 C)  Recent Labs  Lab 08/10/20 1120 08/11/20 0110 08/12/20 0425 08/13/20 1711 08/15/20 0900  WBC 4.3 3.5* 4.0 6.2 4.2  CREATININE 3.74* 3.06* 4.28* 5.64*  5.62* 2.65*    Estimated Creatinine Clearance: 23.6 mL/min (A) (by C-G formula based on SCr of 2.65 mg/dL (H)).    Allergies  Allergen Reactions  . Ace Inhibitors Swelling and Anaphylaxis  . Ativan [Lorazepam] Other (See Comments)    Reaction:Hallucinations and headaches  . Compazine [Prochlorperazine Edisylate] Anaphylaxis, Nausea And Vomiting and Other (See Comments)    Other reaction(s): dystonia from this vs. Reglan, 23 Jul - patient relates that she takes promethazine frequently with no problems  . Sumatriptan Succinate Other (See Comments)    Other reaction(s): delirium and hallucinations per [facility] records  . Zofran [Ondansetron] Nausea And Vomiting    Per pt. she is allergic to zofran or will experience adverse reaction like hallucination   . Losartan Nausea Only  . Prochlorperazine Other  (See Comments)    Reaction:  Unknown . Patient does not remember reaction but she does have vertigo and anxiety along with n and v at times. Could be used to treat any of these   . Reglan [Metoclopramide] Other (See Comments)    Per patient her Dr. Evelina Bucy her off it   . Scopolamine Other (See Comments)    Dizziness, also has vertigo already  . Tape Rash    Plastic tape causes rash  . Tapentadol Rash     Thank you for allowing pharmacy to be a part of this patient's care.  Kristeen Miss, PharmD Clinical Pharmacist  08/17/2020 10:00 AM

## 2020-08-17 NOTE — TOC Transition Note (Signed)
Transition of Care [facility]) - CM/SW Discharge Note   Patient Details  Name: [name] MRN: [id number] Date of Birth: 1961-01-18  [facility] CM/SW Contact:  Candie Chroman, LCSW Phone Number: 08/17/2020, 1:32 PM   Clinical Narrative: Patient has orders to discharge home today. [employer] representative is aware. No further concerns. CSW signing off.    Final next level of care: [place] Barriers to Discharge: Barriers Resolved   Patient Goals and CMS Choice     Choice offered to / list presented to : NA  Discharge Placement                    Patient and family notified of of transfer: 08/17/20  Discharge Plan and Services     Post Acute Care Choice: Resumption of Svcs/PTA Provider                    HH Arranged: RN, PT [facility] Agency: [facility] Date [facility] Agency Contacted: 08/17/20   Representative spoke with at [place]: [place]) Interventions     Readmission Risk Interventions Readmission Risk Prevention Plan 08/14/2020 06/06/2020 05/03/2020  Transportation Screening - Complete Complete  Medication Review [employer] photographer) - Complete Complete  PCP or Specialist appointment within 3-5 days of discharge Complete Complete Complete  HRI or Home Care Consult Complete Complete -  SW Recovery Care/Counseling Consult Complete - Complete  Palliative Care Screening Complete Not Applicable Not [place] Not Applicable Not Applicable Not Applicable  Some recent data might be hidden

## 2020-08-17 NOTE — Care Management Important Message (Signed)
Important Message  Patient Details  Name: [name] MRN: [id number] Date of Birth: [birth year]   Medicare Important Message Given:  Yes  Reviewed with patient via room phone due to isolation status.     Dannette Barbara 08/17/2020, 3:41 PM

## 2020-08-18 LAB — AEROBIC/ANAEROBIC CULTURE W GRAM STAIN (SURGICAL/DEEP WOUND)
Culture: NO GROWTH
Gram Stain: NONE SEEN

## 2020-08-20 DIAGNOSIS — Z992 Dependence on renal dialysis: Secondary | ICD-10-CM | POA: Diagnosis not present

## 2020-08-20 DIAGNOSIS — Z79899 Other long term (current) drug therapy: Secondary | ICD-10-CM | POA: Diagnosis not present

## 2020-08-20 DIAGNOSIS — G0491 Myelitis, unspecified: Secondary | ICD-10-CM | POA: Diagnosis not present

## 2020-08-20 DIAGNOSIS — N186 End stage renal disease: Secondary | ICD-10-CM | POA: Diagnosis not present

## 2020-08-21 ENCOUNTER — Ambulatory Visit: Payer: Medicare HMO

## 2020-08-21 DIAGNOSIS — N186 End stage renal disease: Secondary | ICD-10-CM | POA: Diagnosis not present

## 2020-08-21 DIAGNOSIS — Z992 Dependence on renal dialysis: Secondary | ICD-10-CM | POA: Diagnosis not present

## 2020-08-22 DIAGNOSIS — Z992 Dependence on renal dialysis: Secondary | ICD-10-CM | POA: Diagnosis not present

## 2020-08-22 DIAGNOSIS — N186 End stage renal disease: Secondary | ICD-10-CM | POA: Diagnosis not present

## 2020-08-22 DIAGNOSIS — G0491 Myelitis, unspecified: Secondary | ICD-10-CM | POA: Diagnosis not present

## 2020-08-24 ENCOUNTER — Telehealth: Payer: Self-pay

## 2020-08-24 DIAGNOSIS — Z992 Dependence on renal dialysis: Secondary | ICD-10-CM | POA: Diagnosis not present

## 2020-08-24 DIAGNOSIS — G0491 Myelitis, unspecified: Secondary | ICD-10-CM | POA: Diagnosis not present

## 2020-08-24 DIAGNOSIS — N186 End stage renal disease: Secondary | ICD-10-CM | POA: Diagnosis not present

## 2020-08-24 NOTE — Telephone Encounter (Signed)
Spoke with patient's son [name] and scheduled a in-person Palliative Consult for 09/11/20 @ 12PM   COVID screening was negative. No pets in home. Patient lives alone. She has dialysis on M,W,F    Consent obtained; updated Outlook/Netsmart/Team List and Epic.

## 2020-08-27 DIAGNOSIS — G0491 Myelitis, unspecified: Secondary | ICD-10-CM | POA: Diagnosis not present

## 2020-08-27 DIAGNOSIS — Z79899 Other long term (current) drug therapy: Secondary | ICD-10-CM | POA: Diagnosis not present

## 2020-08-27 DIAGNOSIS — E1142 Type 2 diabetes mellitus with diabetic polyneuropathy: Secondary | ICD-10-CM | POA: Diagnosis not present

## 2020-08-27 DIAGNOSIS — Z992 Dependence on renal dialysis: Secondary | ICD-10-CM | POA: Diagnosis not present

## 2020-08-27 DIAGNOSIS — F418 Other specified anxiety disorders: Secondary | ICD-10-CM | POA: Diagnosis not present

## 2020-08-27 DIAGNOSIS — Z5181 Encounter for therapeutic drug level monitoring: Secondary | ICD-10-CM | POA: Diagnosis not present

## 2020-08-27 DIAGNOSIS — N186 End stage renal disease: Secondary | ICD-10-CM | POA: Diagnosis not present

## 2020-08-28 ENCOUNTER — Ambulatory Visit: Payer: Medicare Other | Admitting: Urology

## 2020-08-29 DIAGNOSIS — G0491 Myelitis, unspecified: Secondary | ICD-10-CM | POA: Diagnosis not present

## 2020-08-29 DIAGNOSIS — N186 End stage renal disease: Secondary | ICD-10-CM | POA: Diagnosis not present

## 2020-08-29 DIAGNOSIS — Z992 Dependence on renal dialysis: Secondary | ICD-10-CM | POA: Diagnosis not present

## 2020-08-30 DIAGNOSIS — G894 Chronic pain syndrome: Secondary | ICD-10-CM | POA: Diagnosis not present

## 2020-08-30 DIAGNOSIS — M4626 Osteomyelitis of vertebra, lumbar region: Secondary | ICD-10-CM | POA: Diagnosis not present

## 2020-08-30 DIAGNOSIS — E11649 Type 2 diabetes mellitus with hypoglycemia without coma: Secondary | ICD-10-CM | POA: Diagnosis not present

## 2020-08-30 DIAGNOSIS — F331 Major depressive disorder, recurrent, moderate: Secondary | ICD-10-CM | POA: Diagnosis not present

## 2020-08-31 ENCOUNTER — Ambulatory Visit: Payer: Medicare Other | Admitting: Urology

## 2020-08-31 DIAGNOSIS — N186 End stage renal disease: Secondary | ICD-10-CM | POA: Diagnosis not present

## 2020-08-31 DIAGNOSIS — G0491 Myelitis, unspecified: Secondary | ICD-10-CM | POA: Diagnosis not present

## 2020-08-31 DIAGNOSIS — Z992 Dependence on renal dialysis: Secondary | ICD-10-CM | POA: Diagnosis not present

## 2020-09-03 ENCOUNTER — Other Ambulatory Visit: Payer: Self-pay | Admitting: *Deleted

## 2020-09-03 DIAGNOSIS — G0491 Myelitis, unspecified: Secondary | ICD-10-CM | POA: Diagnosis not present

## 2020-09-03 DIAGNOSIS — D472 Monoclonal gammopathy: Secondary | ICD-10-CM

## 2020-09-03 DIAGNOSIS — D631 Anemia in chronic kidney disease: Secondary | ICD-10-CM

## 2020-09-03 DIAGNOSIS — Z992 Dependence on renal dialysis: Secondary | ICD-10-CM | POA: Diagnosis not present

## 2020-09-03 DIAGNOSIS — Z79899 Other long term (current) drug therapy: Secondary | ICD-10-CM | POA: Diagnosis not present

## 2020-09-03 DIAGNOSIS — N186 End stage renal disease: Secondary | ICD-10-CM | POA: Diagnosis not present

## 2020-09-03 DIAGNOSIS — Z5181 Encounter for therapeutic drug level monitoring: Secondary | ICD-10-CM | POA: Diagnosis not present

## 2020-09-04 ENCOUNTER — Inpatient Hospital Stay: Payer: Medicare HMO | Admitting: Oncology

## 2020-09-04 ENCOUNTER — Encounter: Payer: Self-pay | Admitting: *Deleted

## 2020-09-05 DIAGNOSIS — N186 End stage renal disease: Secondary | ICD-10-CM | POA: Diagnosis not present

## 2020-09-05 DIAGNOSIS — Z992 Dependence on renal dialysis: Secondary | ICD-10-CM | POA: Diagnosis not present

## 2020-09-05 DIAGNOSIS — G0491 Myelitis, unspecified: Secondary | ICD-10-CM | POA: Diagnosis not present

## 2020-09-07 DIAGNOSIS — G0491 Myelitis, unspecified: Secondary | ICD-10-CM | POA: Diagnosis not present

## 2020-09-07 DIAGNOSIS — Z992 Dependence on renal dialysis: Secondary | ICD-10-CM | POA: Diagnosis not present

## 2020-09-07 DIAGNOSIS — N186 End stage renal disease: Secondary | ICD-10-CM | POA: Diagnosis not present

## 2020-09-07 IMAGING — CT CT HEAD W/O CM
3 series · 16 of 47 positions shown, 19 images · non-contrast
Comparison: 10/04/2018

CLINICAL DATA: Headache, classic migraine

EXAM:
CT HEAD WITHOUT CONTRAST
TECHNIQUE: Contiguous axial images were obtained from the base of the skull
through the vertex without intravenous contrast.

[Series 3: head wo · axial · 0.41mm/px · z∈[-137,-12]mm · 10 of 31 slices shown, 13 images]
[im 3/31  brain]
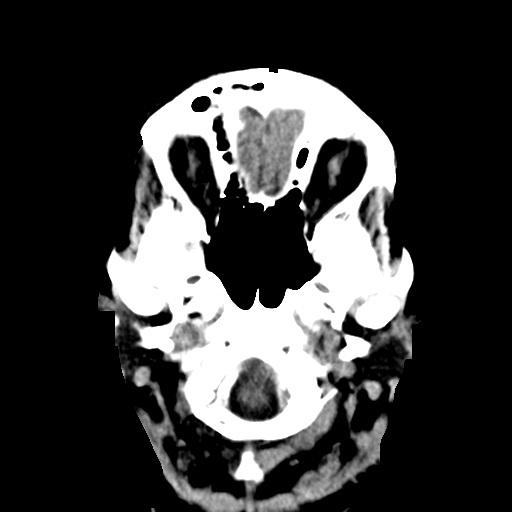
[im 3/31  bone]
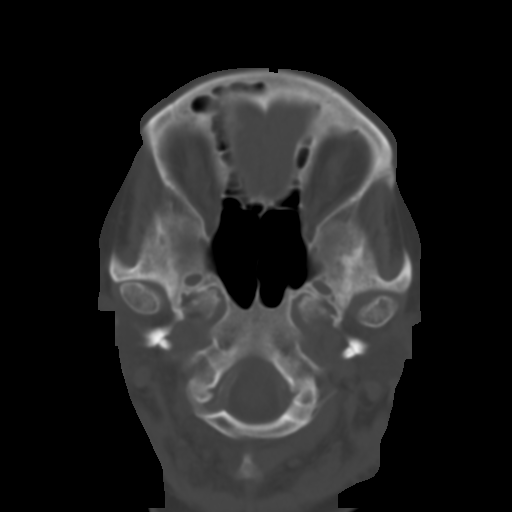
[im 6/31  brain]
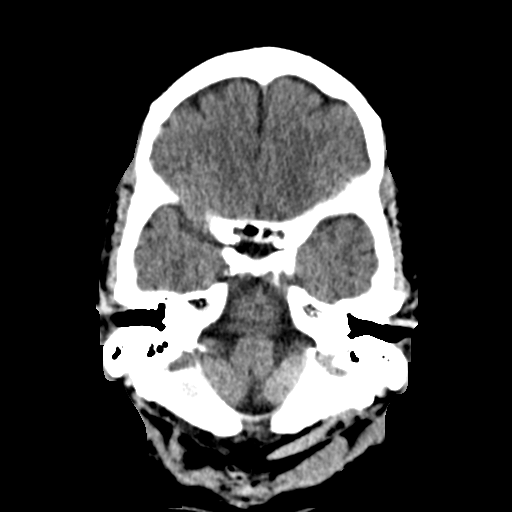
[im 9/31  brain]
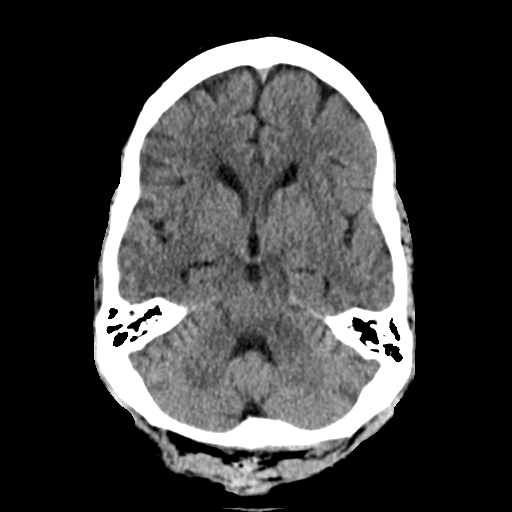
[im 11/31  brain]
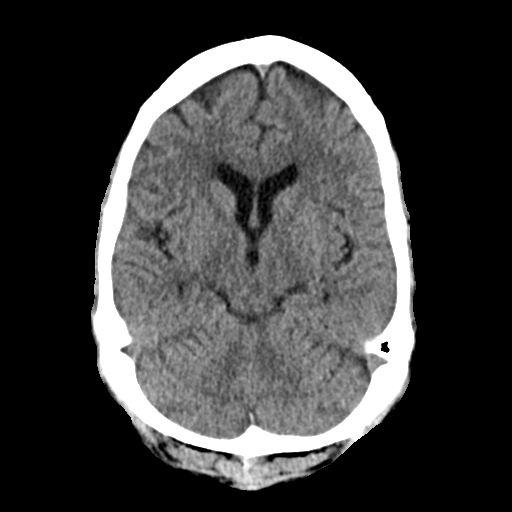
[im 14/31  brain]
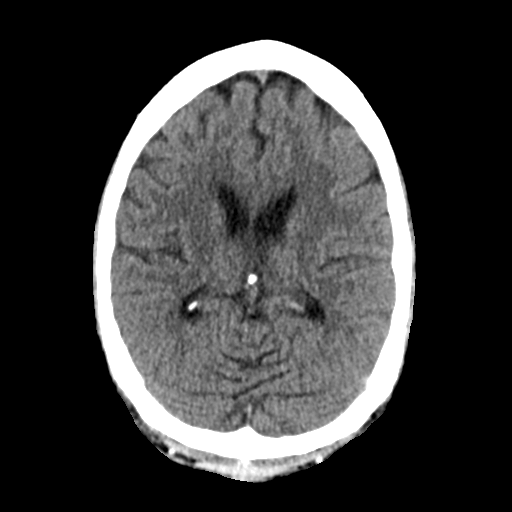
[im 14/31  bone]
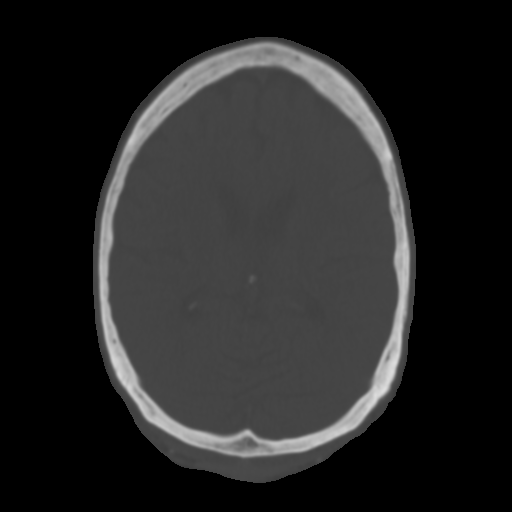
[im 17/31  brain]
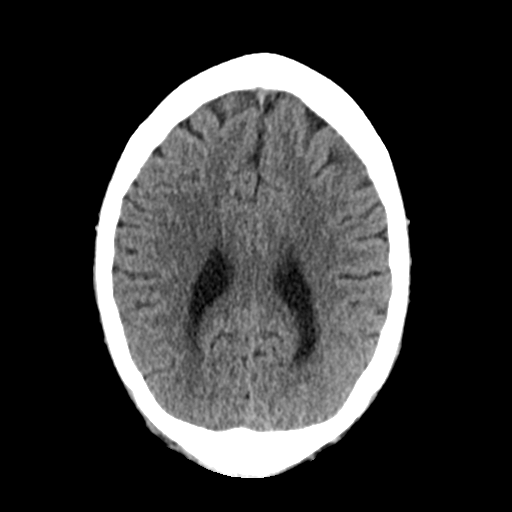
[im 20/31  brain]
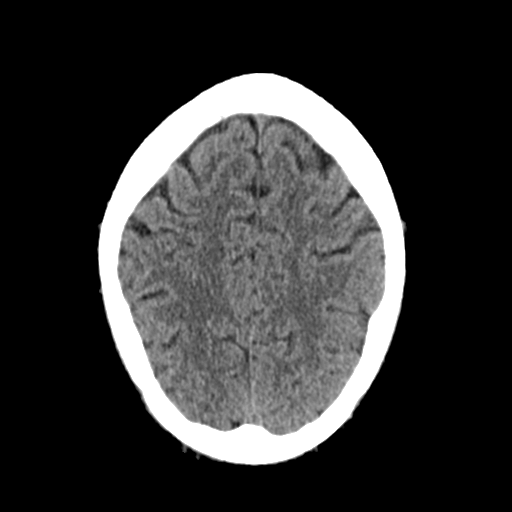
[im 23/31  brain]
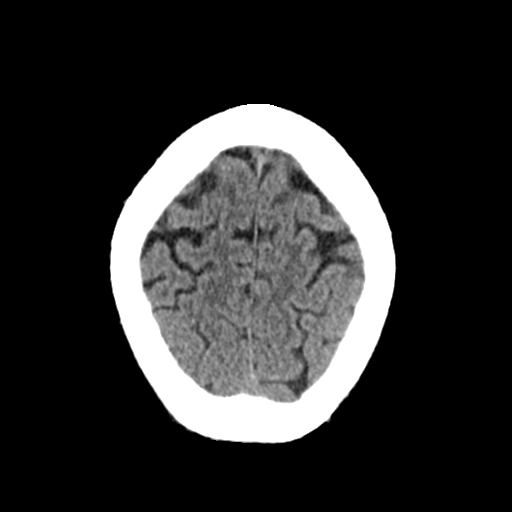
[im 25/31  brain]
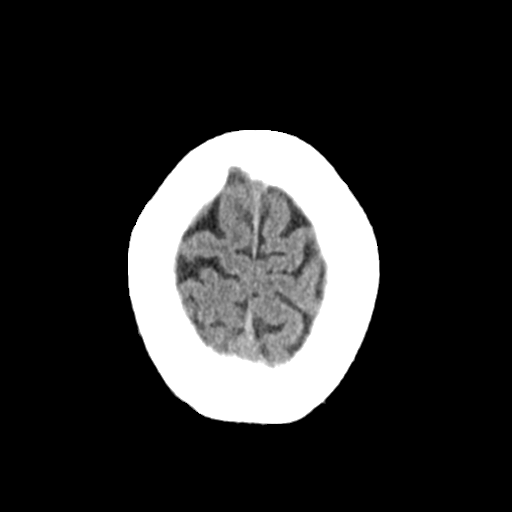
[im 25/31  bone]
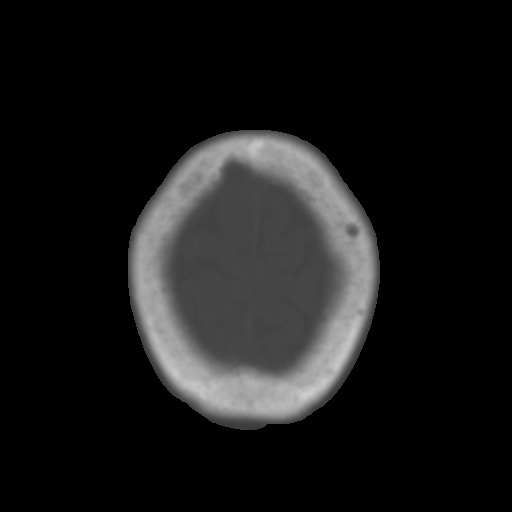
[im 28/31  brain]
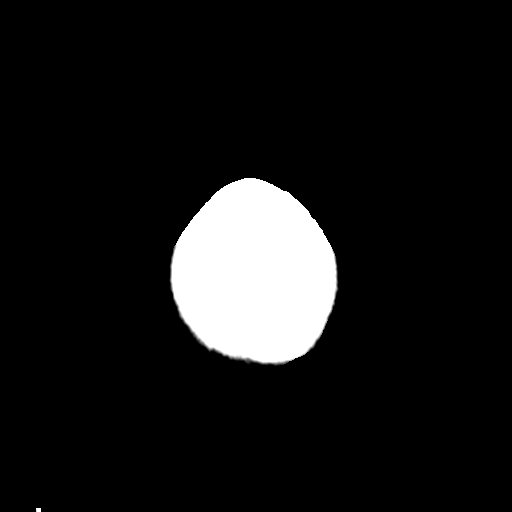

[Series 4: coronal soft tissue · coronal · 0.37mm/px · 3 of 68 slices shown]
[im 25/68  brain]
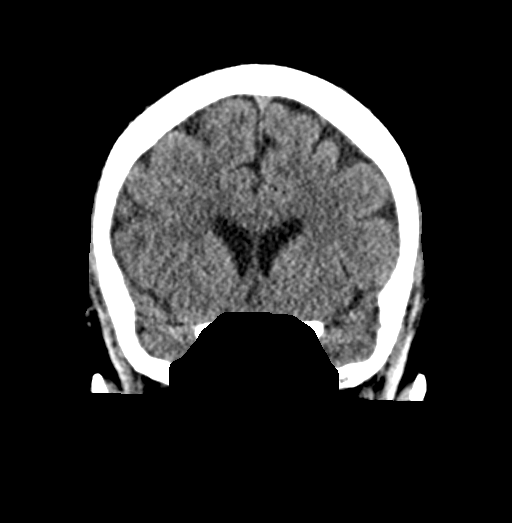
[im 31/68  brain]
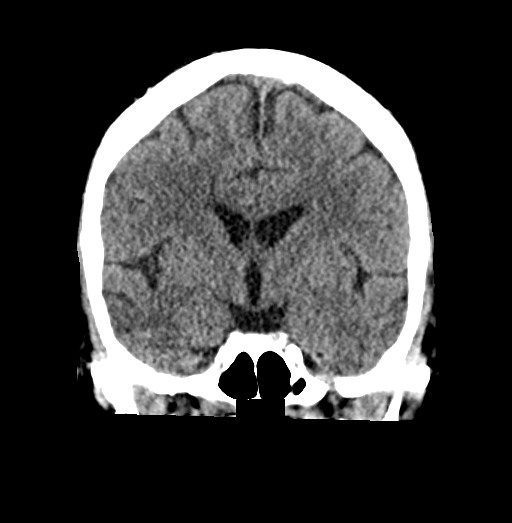
[im 37/68  brain]
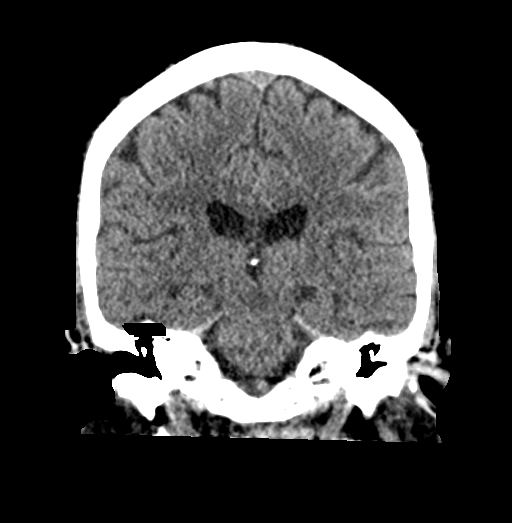

[Series 5: sagittal soft tissue · sagittal · 0.35mm/px · 3 of 51 slices shown]
[im 17/51  brain]
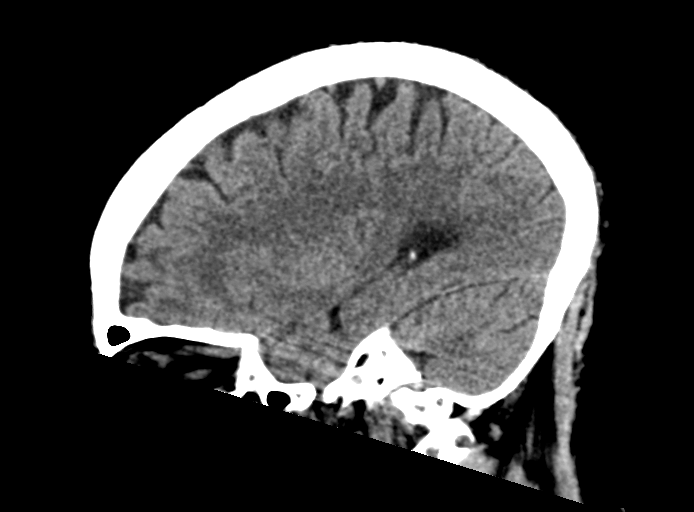
[im 26/51  brain]
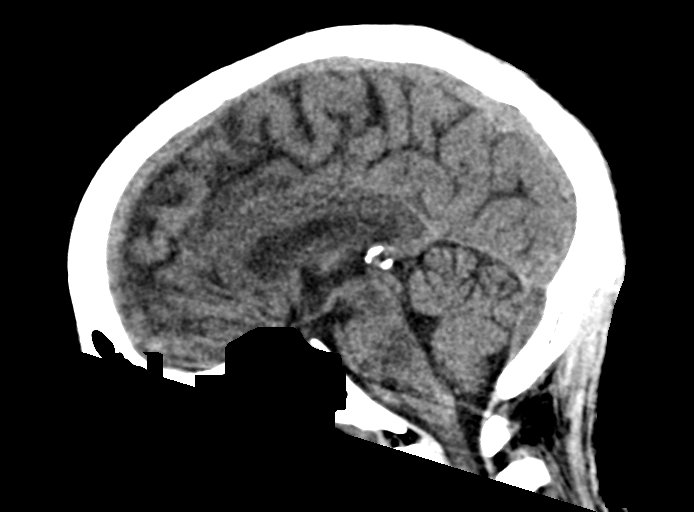
[im 34/51  brain]
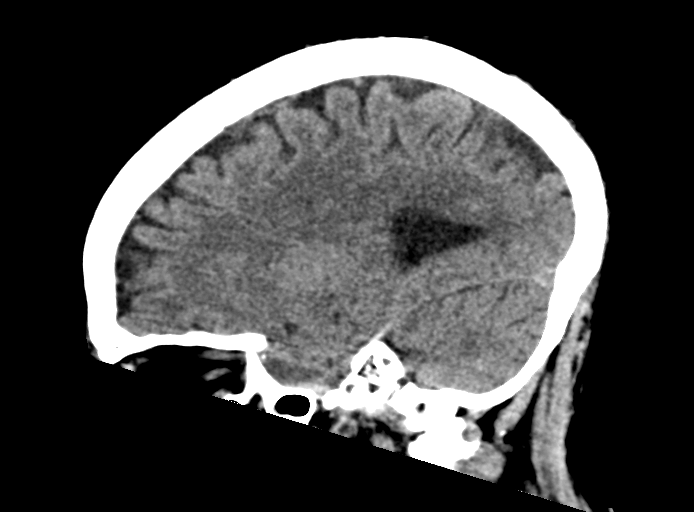

[16 of 47 positions shown; findings below may reference images not displayed]

FINDINGS: Brain: No acute intracranial abnormality. Specifically, no
hemorrhage, hydrocephalus, mass lesion, acute infarction, or
significant intracranial injury.

Vascular: No hyperdense vessel or unexpected calcification.

Skull: No acute calvarial abnormality.

Sinuses/Orbits: Visualized paranasal sinuses and mastoids clear.
Orbital soft tissues unremarkable.

Other: None
IMPRESSION: No acute intracranial abnormality.

## 2020-09-10 ENCOUNTER — Ambulatory Visit: Payer: Medicare Other | Admitting: Oncology

## 2020-09-11 ENCOUNTER — Other Ambulatory Visit: Payer: Self-pay

## 2020-09-11 ENCOUNTER — Telehealth: Payer: Self-pay | Admitting: Primary Care

## 2020-09-11 ENCOUNTER — Other Ambulatory Visit: Payer: Medicaid Other | Admitting: Primary Care

## 2020-09-11 DIAGNOSIS — G0491 Myelitis, unspecified: Secondary | ICD-10-CM | POA: Diagnosis not present

## 2020-09-11 DIAGNOSIS — Z992 Dependence on renal dialysis: Secondary | ICD-10-CM | POA: Diagnosis not present

## 2020-09-11 DIAGNOSIS — N186 End stage renal disease: Secondary | ICD-10-CM | POA: Diagnosis not present

## 2020-09-11 NOTE — Telephone Encounter (Signed)
T c ?

## 2020-09-13 DIAGNOSIS — N186 End stage renal disease: Secondary | ICD-10-CM | POA: Diagnosis not present

## 2020-09-13 DIAGNOSIS — Z992 Dependence on renal dialysis: Secondary | ICD-10-CM | POA: Diagnosis not present

## 2020-09-13 DIAGNOSIS — G0491 Myelitis, unspecified: Secondary | ICD-10-CM | POA: Diagnosis not present

## 2020-09-13 NOTE — Telephone Encounter (Signed)
T/c to son to inform of missed visit. Will visit MWF due to dialysis schedule.

## 2020-09-14 DIAGNOSIS — Z992 Dependence on renal dialysis: Secondary | ICD-10-CM | POA: Diagnosis not present

## 2020-09-14 DIAGNOSIS — G0491 Myelitis, unspecified: Secondary | ICD-10-CM | POA: Diagnosis not present

## 2020-09-14 DIAGNOSIS — N186 End stage renal disease: Secondary | ICD-10-CM | POA: Diagnosis not present

## 2020-09-16 DIAGNOSIS — R001 Bradycardia, unspecified: Secondary | ICD-10-CM | POA: Diagnosis not present

## 2020-09-16 DIAGNOSIS — I10 Essential (primary) hypertension: Secondary | ICD-10-CM | POA: Diagnosis not present

## 2020-09-17 ENCOUNTER — Encounter: Payer: Self-pay | Admitting: Emergency Medicine

## 2020-09-17 ENCOUNTER — Ambulatory Visit: Admission: RE | Admit: 2020-09-17 | Payer: Medicare HMO | Source: Ambulatory Visit

## 2020-09-17 ENCOUNTER — Emergency Department: Payer: Medicare HMO

## 2020-09-17 ENCOUNTER — Inpatient Hospital Stay: Payer: Medicare HMO

## 2020-09-17 ENCOUNTER — Other Ambulatory Visit: Payer: Self-pay

## 2020-09-17 ENCOUNTER — Inpatient Hospital Stay
Admission: EM | Admit: 2020-09-17 | Discharge: 2020-09-21 | DRG: 438 | Disposition: A | Discharge status: Home Health | Payer: Medicare HMO | Attending: Internal Medicine | Admitting: Internal Medicine

## 2020-09-17 DIAGNOSIS — Z79899 Other long term (current) drug therapy: Secondary | ICD-10-CM

## 2020-09-17 DIAGNOSIS — R531 Weakness: Secondary | ICD-10-CM | POA: Diagnosis not present

## 2020-09-17 DIAGNOSIS — I2510 Atherosclerotic heart disease of native coronary artery without angina pectoris: Secondary | ICD-10-CM | POA: Diagnosis present

## 2020-09-17 DIAGNOSIS — E1169 Type 2 diabetes mellitus with other specified complication: Secondary | ICD-10-CM | POA: Diagnosis present

## 2020-09-17 DIAGNOSIS — Z7982 Long term (current) use of aspirin: Secondary | ICD-10-CM

## 2020-09-17 DIAGNOSIS — K859 Acute pancreatitis without necrosis or infection, unspecified: Principal | ICD-10-CM | POA: Diagnosis present

## 2020-09-17 DIAGNOSIS — I823 Embolism and thrombosis of renal vein: Secondary | ICD-10-CM | POA: Diagnosis not present

## 2020-09-17 DIAGNOSIS — D631 Anemia in chronic kidney disease: Secondary | ICD-10-CM | POA: Diagnosis present

## 2020-09-17 DIAGNOSIS — Z20822 Contact with and (suspected) exposure to covid-19: Secondary | ICD-10-CM | POA: Diagnosis not present

## 2020-09-17 DIAGNOSIS — R4020 Unspecified coma: Secondary | ICD-10-CM | POA: Diagnosis not present

## 2020-09-17 DIAGNOSIS — R404 Transient alteration of awareness: Secondary | ICD-10-CM | POA: Diagnosis not present

## 2020-09-17 DIAGNOSIS — J449 Chronic obstructive pulmonary disease, unspecified: Secondary | ICD-10-CM | POA: Diagnosis present

## 2020-09-17 DIAGNOSIS — T68XXXA Hypothermia, initial encounter: Secondary | ICD-10-CM | POA: Diagnosis not present

## 2020-09-17 DIAGNOSIS — M4624 Osteomyelitis of vertebra, thoracic region: Secondary | ICD-10-CM | POA: Diagnosis present

## 2020-09-17 DIAGNOSIS — E1151 Type 2 diabetes mellitus with diabetic peripheral angiopathy without gangrene: Secondary | ICD-10-CM | POA: Diagnosis present

## 2020-09-17 DIAGNOSIS — R680 Hypothermia, not associated with low environmental temperature: Secondary | ICD-10-CM | POA: Diagnosis present

## 2020-09-17 DIAGNOSIS — Z992 Dependence on renal dialysis: Secondary | ICD-10-CM

## 2020-09-17 DIAGNOSIS — I959 Hypotension, unspecified: Secondary | ICD-10-CM | POA: Diagnosis not present

## 2020-09-17 DIAGNOSIS — R000 Tachycardia, unspecified: Secondary | ICD-10-CM | POA: Diagnosis not present

## 2020-09-17 DIAGNOSIS — Z955 Presence of coronary angioplasty implant and graft: Secondary | ICD-10-CM

## 2020-09-17 DIAGNOSIS — T8189XA Other complications of procedures, not elsewhere classified, initial encounter: Secondary | ICD-10-CM | POA: Diagnosis not present

## 2020-09-17 DIAGNOSIS — K3184 Gastroparesis: Secondary | ICD-10-CM | POA: Diagnosis present

## 2020-09-17 DIAGNOSIS — Z8673 Personal history of transient ischemic attack (TIA), and cerebral infarction without residual deficits: Secondary | ICD-10-CM

## 2020-09-17 DIAGNOSIS — Z792 Long term (current) use of antibiotics: Secondary | ICD-10-CM

## 2020-09-17 DIAGNOSIS — Z85528 Personal history of other malignant neoplasm of kidney: Secondary | ICD-10-CM

## 2020-09-17 DIAGNOSIS — E1122 Type 2 diabetes mellitus with diabetic chronic kidney disease: Secondary | ICD-10-CM | POA: Diagnosis present

## 2020-09-17 DIAGNOSIS — I82890 Acute embolism and thrombosis of other specified veins: Secondary | ICD-10-CM | POA: Diagnosis not present

## 2020-09-17 DIAGNOSIS — Z89412 Acquired absence of left great toe: Secondary | ICD-10-CM

## 2020-09-17 DIAGNOSIS — Z9842 Cataract extraction status, left eye: Secondary | ICD-10-CM

## 2020-09-17 DIAGNOSIS — Z841 Family history of disorders of kidney and ureter: Secondary | ICD-10-CM

## 2020-09-17 DIAGNOSIS — I82622 Acute embolism and thrombosis of deep veins of left upper extremity: Secondary | ICD-10-CM | POA: Diagnosis not present

## 2020-09-17 DIAGNOSIS — I252 Old myocardial infarction: Secondary | ICD-10-CM

## 2020-09-17 DIAGNOSIS — I517 Cardiomegaly: Secondary | ICD-10-CM | POA: Diagnosis not present

## 2020-09-17 DIAGNOSIS — I10 Essential (primary) hypertension: Secondary | ICD-10-CM | POA: Diagnosis not present

## 2020-09-17 DIAGNOSIS — R001 Bradycardia, unspecified: Secondary | ICD-10-CM | POA: Diagnosis present

## 2020-09-17 DIAGNOSIS — E1165 Type 2 diabetes mellitus with hyperglycemia: Secondary | ICD-10-CM | POA: Diagnosis not present

## 2020-09-17 DIAGNOSIS — N2581 Secondary hyperparathyroidism of renal origin: Secondary | ICD-10-CM | POA: Diagnosis present

## 2020-09-17 DIAGNOSIS — N186 End stage renal disease: Secondary | ICD-10-CM | POA: Diagnosis not present

## 2020-09-17 DIAGNOSIS — F419 Anxiety disorder, unspecified: Secondary | ICD-10-CM | POA: Diagnosis present

## 2020-09-17 DIAGNOSIS — M1990 Unspecified osteoarthritis, unspecified site: Secondary | ICD-10-CM | POA: Diagnosis present

## 2020-09-17 DIAGNOSIS — Z8521 Personal history of malignant neoplasm of larynx: Secondary | ICD-10-CM

## 2020-09-17 DIAGNOSIS — I82B22 Chronic embolism and thrombosis of left subclavian vein: Secondary | ICD-10-CM | POA: Diagnosis not present

## 2020-09-17 DIAGNOSIS — E11649 Type 2 diabetes mellitus with hypoglycemia without coma: Secondary | ICD-10-CM | POA: Diagnosis not present

## 2020-09-17 DIAGNOSIS — M7989 Other specified soft tissue disorders: Secondary | ICD-10-CM

## 2020-09-17 DIAGNOSIS — Z833 Family history of diabetes mellitus: Secondary | ICD-10-CM

## 2020-09-17 DIAGNOSIS — K449 Diaphragmatic hernia without obstruction or gangrene: Secondary | ICD-10-CM | POA: Diagnosis present

## 2020-09-17 DIAGNOSIS — Z91048 Other nonmedicinal substance allergy status: Secondary | ICD-10-CM

## 2020-09-17 DIAGNOSIS — F32A Depression, unspecified: Secondary | ICD-10-CM | POA: Diagnosis present

## 2020-09-17 DIAGNOSIS — E162 Hypoglycemia, unspecified: Secondary | ICD-10-CM

## 2020-09-17 DIAGNOSIS — J90 Pleural effusion, not elsewhere classified: Secondary | ICD-10-CM | POA: Diagnosis not present

## 2020-09-17 DIAGNOSIS — Z888 Allergy status to other drugs, medicaments and biological substances status: Secondary | ICD-10-CM

## 2020-09-17 DIAGNOSIS — I132 Hypertensive heart and chronic kidney disease with heart failure and with stage 5 chronic kidney disease, or end stage renal disease: Secondary | ICD-10-CM | POA: Diagnosis present

## 2020-09-17 DIAGNOSIS — I5032 Chronic diastolic (congestive) heart failure: Secondary | ICD-10-CM | POA: Diagnosis present

## 2020-09-17 DIAGNOSIS — E782 Mixed hyperlipidemia: Secondary | ICD-10-CM | POA: Diagnosis present

## 2020-09-17 DIAGNOSIS — R4182 Altered mental status, unspecified: Secondary | ICD-10-CM

## 2020-09-17 DIAGNOSIS — I340 Nonrheumatic mitral (valve) insufficiency: Secondary | ICD-10-CM | POA: Diagnosis present

## 2020-09-17 DIAGNOSIS — Z961 Presence of intraocular lens: Secondary | ICD-10-CM | POA: Diagnosis present

## 2020-09-17 DIAGNOSIS — R609 Edema, unspecified: Secondary | ICD-10-CM

## 2020-09-17 DIAGNOSIS — I82B12 Acute embolism and thrombosis of left subclavian vein: Secondary | ICD-10-CM | POA: Diagnosis not present

## 2020-09-17 DIAGNOSIS — Z9841 Cataract extraction status, right eye: Secondary | ICD-10-CM

## 2020-09-17 DIAGNOSIS — K219 Gastro-esophageal reflux disease without esophagitis: Secondary | ICD-10-CM | POA: Diagnosis present

## 2020-09-17 DIAGNOSIS — E1143 Type 2 diabetes mellitus with diabetic autonomic (poly)neuropathy: Secondary | ICD-10-CM | POA: Diagnosis present

## 2020-09-17 LAB — GLUCOSE, CAPILLARY
Glucose-Capillary: 30 mg/dL — CL (ref 70–99)
Glucose-Capillary: 52 mg/dL — ABNORMAL LOW (ref 70–99)
Glucose-Capillary: 51 mg/dL — ABNORMAL LOW (ref 70–99)
Glucose-Capillary: 95 mg/dL (ref 70–99)
Glucose-Capillary: 173 mg/dL — ABNORMAL HIGH (ref 70–99)

## 2020-09-17 LAB — CBC WITH DIFFERENTIAL/PLATELET
Abs Immature Granulocytes: 0.02 10*3/uL (ref 0.00–0.07)
Basophils Absolute: 0 10*3/uL (ref 0.0–0.1)
Basophils Relative: 1 %
Eosinophils Absolute: 0.8 10*3/uL — ABNORMAL HIGH (ref 0.0–0.5)
Eosinophils Relative: 20 %
HCT: 27.9 % — ABNORMAL LOW (ref 36.0–46.0)
Hemoglobin: 9.1 g/dL — ABNORMAL LOW (ref 12.0–15.0)
Immature Granulocytes: 1 %
Lymphocytes Relative: 44 %
Lymphs Abs: 1.8 10*3/uL (ref 0.7–4.0)
MCH: 28.7 pg (ref 26.0–34.0)
MCHC: 32.6 g/dL (ref 30.0–36.0)
MCV: 88 fL (ref 80.0–100.0)
Monocytes Absolute: 0.3 10*3/uL (ref 0.1–1.0)
Monocytes Relative: 7 %
Neutro Abs: 1 10*3/uL — ABNORMAL LOW (ref 1.7–7.7)
Neutrophils Relative %: 27 %
Platelets: 144 10*3/uL — ABNORMAL LOW (ref 150–400)
RBC: 3.17 MIL/uL — ABNORMAL LOW (ref 3.87–5.11)
RDW: 17.9 % — ABNORMAL HIGH (ref 11.5–15.5)
WBC: 4 10*3/uL (ref 4.0–10.5)
nRBC: 0 % (ref 0.0–0.2)

## 2020-09-17 LAB — COMPREHENSIVE METABOLIC PANEL
ALT: 12 U/L (ref 0–44)
AST: 14 U/L — ABNORMAL LOW (ref 15–41)
Albumin: 2.6 g/dL — ABNORMAL LOW (ref 3.5–5.0)
Alkaline Phosphatase: 62 U/L (ref 38–126)
Anion gap: 8 (ref 5–15)
BUN: 15 mg/dL (ref 8–23)
CO2: 31 mmol/L (ref 22–32)
Calcium: 12 mg/dL — ABNORMAL HIGH (ref 8.9–10.3)
Chloride: 101 mmol/L (ref 98–111)
Creatinine, Ser: 4.84 mg/dL — ABNORMAL HIGH (ref 0.44–1.00)
GFR, Estimated: 10 mL/min — ABNORMAL LOW (ref >60)
Glucose, Bld: 24 mg/dL — CL (ref 70–99)
Potassium: 3.4 mmol/L — ABNORMAL LOW (ref 3.5–5.1)
Sodium: 140 mmol/L (ref 135–145)
Total Bilirubin: 0.5 mg/dL (ref 0.3–1.2)
Total Protein: 6.2 g/dL — ABNORMAL LOW (ref 6.5–8.1)

## 2020-09-17 LAB — URINALYSIS, COMPLETE (UACMP) WITH MICROSCOPIC
Bilirubin Urine: NEGATIVE
Glucose, UA: NEGATIVE mg/dL
Hgb urine dipstick: NEGATIVE
Ketones, ur: NEGATIVE mg/dL
Leukocytes,Ua: NEGATIVE
Nitrite: NEGATIVE
Protein, ur: >=300 mg/dL — AB
Specific Gravity, Urine: 1.011 (ref 1.005–1.030)
pH: 7 (ref 5.0–8.0)

## 2020-09-17 LAB — BLOOD GAS, ARTERIAL
Acid-Base Excess: 5.8 mmol/L — ABNORMAL HIGH (ref 0.0–2.0)
Bicarbonate: 32.6 mmol/L — ABNORMAL HIGH (ref 20.0–28.0)
O2 Saturation: 99.3 %
Patient temperature: 37
pCO2 arterial: 59 mmHg — ABNORMAL HIGH (ref 32.0–48.0)
pH, Arterial: 7.35 (ref 7.350–7.450)
pO2, Arterial: 158 mmHg — ABNORMAL HIGH (ref 83.0–108.0)

## 2020-09-17 LAB — RESP PANEL BY RT-PCR (FLU A&B, COVID) ARPGX2
Influenza A by PCR: NEGATIVE
Influenza B by PCR: NEGATIVE
SARS Coronavirus 2 by RT PCR: NEGATIVE

## 2020-09-17 LAB — LACTIC ACID, PLASMA
Lactic Acid, Venous: 0.7 mmol/L (ref 0.5–1.9)
Lactic Acid, Venous: 2.1 mmol/L (ref 0.5–1.9)

## 2020-09-17 LAB — AMMONIA: Ammonia: 21 umol/L (ref 9–35)

## 2020-09-17 LAB — PHOSPHORUS: Phosphorus: 3.7 mg/dL (ref 2.5–4.6)

## 2020-09-17 LAB — CBG MONITORING, ED
Glucose-Capillary: 26 mg/dL — CL (ref 70–99)
Glucose-Capillary: 89 mg/dL (ref 70–99)
Glucose-Capillary: 69 mg/dL — ABNORMAL LOW (ref 70–99)
Glucose-Capillary: 66 mg/dL — ABNORMAL LOW (ref 70–99)

## 2020-09-17 LAB — MRSA PCR SCREENING: MRSA by PCR: NEGATIVE

## 2020-09-17 LAB — URINE DRUG SCREEN, QUALITATIVE (ARMC ONLY)
Amphetamines, Ur Screen: NOT DETECTED
Barbiturates, Ur Screen: NOT DETECTED
Benzodiazepine, Ur Scrn: NOT DETECTED
Cannabinoid 50 Ng, Ur ~~LOC~~: NOT DETECTED
Cocaine Metabolite,Ur ~~LOC~~: NOT DETECTED
MDMA (Ecstasy)Ur Screen: NOT DETECTED
Methadone Scn, Ur: NOT DETECTED
Opiate, Ur Screen: NOT DETECTED
Phencyclidine (PCP) Ur S: NOT DETECTED
Tricyclic, Ur Screen: NOT DETECTED

## 2020-09-17 LAB — TROPONIN I (HIGH SENSITIVITY)
Troponin I (High Sensitivity): 4 ng/L (ref <18)
Troponin I (High Sensitivity): 4 ng/L (ref <18)

## 2020-09-17 LAB — TSH: TSH: 1.437 u[IU]/mL (ref 0.350–4.500)

## 2020-09-17 LAB — PROTIME-INR
INR: 1.2 (ref 0.8–1.2)
Prothrombin Time: 14.5 seconds (ref 11.4–15.2)

## 2020-09-17 LAB — GLUCOSE, RANDOM: Glucose, Bld: 76 mg/dL (ref 70–99)

## 2020-09-17 LAB — ETHANOL: Alcohol, Ethyl (B): <10 mg/dL (ref <10)

## 2020-09-17 LAB — VANCOMYCIN, RANDOM: Vancomycin Rm: 23

## 2020-09-17 LAB — BRAIN NATRIURETIC PEPTIDE: B Natriuretic Peptide: 431.2 pg/mL — ABNORMAL HIGH (ref 0.0–100.0)

## 2020-09-17 LAB — MAGNESIUM: Magnesium: 2.3 mg/dL (ref 1.7–2.4)

## 2020-09-17 LAB — APTT: aPTT: 38 seconds — ABNORMAL HIGH (ref 24–36)

## 2020-09-17 MED ORDER — TRAMADOL HCL 50 MG PO TABS
50.0000 mg | ORAL_TABLET | Freq: Two times a day (BID) | ORAL | Status: DC | PRN
  Administered 2020-09-17: 50 mg via ORAL
  Filled 2020-09-17: qty 1

## 2020-09-17 MED ORDER — SODIUM CHLORIDE 0.9% FLUSH: 3.0000 mL | INTRAVENOUS | Status: DC | PRN

## 2020-09-17 MED ORDER — ATROPINE SULFATE 1 MG/10ML IJ SOSY
0.5000 mg | PREFILLED_SYRINGE | Freq: Once | INTRAMUSCULAR | Status: AC
  Administered 2020-09-17: 0.5 mg via INTRAVENOUS

## 2020-09-17 MED ORDER — VANCOMYCIN HCL 1500 MG/300ML IV SOLN: 1500.0000 mg | Freq: Once | INTRAVENOUS | Status: DC

## 2020-09-17 MED ORDER — VANCOMYCIN VARIABLE DOSE PER UNSTABLE RENAL FUNCTION (PHARMACIST DOSING): Status: DC

## 2020-09-17 MED ORDER — GLUCAGON HCL RDNA (DIAGNOSTIC) 1 MG IJ SOLR
INTRAMUSCULAR | Status: AC
  Administered 2020-09-17: 1 mg via INTRAVENOUS
  Filled 2020-09-17: qty 1

## 2020-09-17 MED ORDER — DEXTROSE 10 % IV SOLN: INTRAVENOUS | Status: DC

## 2020-09-17 MED ORDER — DEXTROSE 50 % IV SOLN
INTRAVENOUS | Status: AC
  Filled 2020-09-17: qty 50

## 2020-09-17 MED ORDER — HEPARIN SODIUM (PORCINE) 5000 UNIT/ML IJ SOLN
5000.0000 [IU] | Freq: Three times a day (TID) | INTRAMUSCULAR | Status: DC
  Administered 2020-09-17 – 2020-09-21 (×11): 5000 [IU] via SUBCUTANEOUS
  Filled 2020-09-17 (×10): qty 1

## 2020-09-17 MED ORDER — INSULIN ASPART 100 UNIT/ML ~~LOC~~ SOLN
SUBCUTANEOUS | Status: AC
  Administered 2020-09-17: 5 [IU] via INTRAVENOUS
  Filled 2020-09-17: qty 1

## 2020-09-17 MED ORDER — PIPERACILLIN-TAZOBACTAM 3.375 G IVPB 30 MIN
3.3750 g | Freq: Once | INTRAVENOUS | Status: AC
  Administered 2020-09-17: 3.375 g via INTRAVENOUS
  Filled 2020-09-17: qty 50

## 2020-09-17 MED ORDER — CALCIUM GLUCONATE 10 % IV SOLN
1.0000 g | Freq: Once | INTRAVENOUS | Status: AC
  Administered 2020-09-17: 1 g via INTRAVENOUS

## 2020-09-17 MED ORDER — EPINEPHRINE HCL 5 MG/250ML IV SOLN IN NS
0.5000 ug/min | INTRAVENOUS | Status: DC
  Administered 2020-09-17: 14:00:00 0.5 ug/min via INTRAVENOUS
  Administered 2020-09-18: 5 ug/min via INTRAVENOUS
  Filled 2020-09-17 (×2): qty 250

## 2020-09-17 MED ORDER — SODIUM CHLORIDE 0.9 % IV SOLN: 250.0000 mL | INTRAVENOUS | Status: DC | PRN

## 2020-09-17 MED ORDER — DOCUSATE SODIUM 100 MG PO CAPS: 100.0000 mg | ORAL_CAPSULE | Freq: Two times a day (BID) | ORAL | Status: DC | PRN

## 2020-09-17 MED ORDER — INSULIN ASPART 100 UNIT/ML IV SOLN
5.0000 [IU] | Freq: Once | INTRAVENOUS | Status: AC
  Filled 2020-09-17: qty 0.05

## 2020-09-17 MED ORDER — ACETAMINOPHEN 325 MG PO TABS
650.0000 mg | ORAL_TABLET | Freq: Four times a day (QID) | ORAL | Status: DC | PRN
  Filled 2020-09-17: qty 2

## 2020-09-17 MED ORDER — CALCIUM CHLORIDE 10 % IV SOLN: 1.0000 g | Freq: Once | INTRAVENOUS | Status: DC

## 2020-09-17 MED ORDER — NOREPINEPHRINE 4 MG/250ML-% IV SOLN
0.0000 ug/min | INTRAVENOUS | Status: DC
  Administered 2020-09-17: 2.5 ug/min via INTRAVENOUS
  Filled 2020-09-17 (×2): qty 250

## 2020-09-17 MED ORDER — SODIUM CHLORIDE 0.9 % IV SOLN
2.0000 g | Freq: Two times a day (BID) | INTRAVENOUS | Status: DC
  Administered 2020-09-17 – 2020-09-18 (×2): 2 g via INTRAVENOUS
  Filled 2020-09-17 (×3): qty 2

## 2020-09-17 MED ORDER — DEXTROSE 50 % IV SOLN
50.0000 mL | Freq: Once | INTRAVENOUS | Status: AC
  Administered 2020-09-17: 50 mL via INTRAVENOUS

## 2020-09-17 MED ORDER — SODIUM CHLORIDE 0.9% FLUSH
3.0000 mL | Freq: Two times a day (BID) | INTRAVENOUS | Status: DC
  Administered 2020-09-17 – 2020-09-20 (×6): 3 mL via INTRAVENOUS

## 2020-09-17 MED ORDER — GLUCAGON HCL RDNA (DIAGNOSTIC) 1 MG IJ SOLR: 1.0000 mg | Freq: Once | INTRAMUSCULAR | Status: AC | PRN

## 2020-09-17 MED ORDER — VANCOMYCIN HCL IN DEXTROSE 1-5 GM/200ML-% IV SOLN: 1000.0000 mg | INTRAVENOUS | Status: DC

## 2020-09-17 MED ORDER — DEXTROSE 50 % IV SOLN
1.0000 | Freq: Once | INTRAVENOUS | Status: AC
  Administered 2020-09-17: 50 mL via INTRAVENOUS
  Filled 2020-09-17: qty 50

## 2020-09-17 MED ORDER — CALCIUM CHLORIDE 10 % IV SOLN
3.0000 g | Freq: Once | INTRAVENOUS | Status: AC
  Administered 2020-09-17: 3 g via INTRAVENOUS

## 2020-09-17 MED ORDER — POLYETHYLENE GLYCOL 3350 17 G PO PACK: 17.0000 g | PACK | Freq: Every day | ORAL | Status: DC | PRN

## 2020-09-17 NOTE — ED Notes (Signed)
This RN on phone with pharmacy to verify glucagon and administer 1mg  available in ED.

## 2020-09-17 NOTE — Progress Notes (Signed)
Pharmacy Antibiotic Note  [name] is a 59 y.o. female admitted on 09/17/2020 with pneumonia and bacteremia.  Pharmacy has been consulted for Cefepime and Vancomycin dosing. Patient is ESRD on HD on Monday, Wednesday, Friday schedule. She was receiving Vancomycin 1g IV with each dialysis session prior to admission. Did not get dialysis this morning and nephrology is holding on dialysis until she stabilizes. May consider if CRRT if remains unstable.  Plan: Will order a random Vancomycin level to see where we are. If subtherapeutic will proceed with Vancomycin 1g x 1 dose and then follow up on plan for HD vs CRRT.  12/27:  Vanc random @ 1718 :   23  Will recheck vanc level on 12/29 after next HD session. Will f/u plans for HD vs CRRT on 12/28.   Cefepime 2g IV q12h, will need to follow up on HD vs CRRT plan.  Height: 5\' 5"  (165.1 cm) Weight: 83 kg (182 lb 15.7 oz) IBW/kg (Calculated) : 57  Temp (24hrs), Avg:90.3 F (32.4 C), Min:90 F (32.2 C), Max:90.5 F (32.5 C)  Recent Labs  Lab 09/17/20 1124 09/17/20 1718 09/17/20 1731  WBC 4.0  --   --   CREATININE 4.84*  --   --   LATICACIDVEN 0.7  --  2.1*  VANCORANDOM  --  23  --     Estimated Creatinine Clearance: 13 mL/min (A) (by C-G formula based on SCr of 4.84 mg/dL (H)).    Allergies  Allergen Reactions  . Ace Inhibitors Swelling and Anaphylaxis  . Ativan [Lorazepam] Other (See Comments)    Reaction:Hallucinations and headaches  . Compazine [Prochlorperazine Edisylate] Anaphylaxis, Nausea And Vomiting and Other (See Comments)    Other reaction(s): dystonia from this vs. Reglan, 23 Jul - patient relates that she takes promethazine frequently with no problems  . Sumatriptan Succinate Other (See Comments)    Other reaction(s): delirium and hallucinations per [facility] records  . Zofran [Ondansetron] Nausea And Vomiting    Per pt. she is allergic to zofran or will experience adverse reaction like hallucination   . Losartan Nausea  Only  . Prochlorperazine Other (See Comments)    Reaction:  Unknown . Patient does not remember reaction but she does have vertigo and anxiety along with n and v at times. Could be used to treat any of these   . Reglan [Metoclopramide] Other (See Comments)    Per patient her Dr. Evelina Bucy her off it   . Scopolamine Other (See Comments)    Dizziness, also has vertigo already  . Tape Rash    Plastic tape causes rash  . Tapentadol Rash    Antimicrobials this admission: Vancomycin  12/27 >>  Cefepime 12/27 >>   Dose adjustments this admission:  Microbiology results:   Thank you for allowing pharmacy to be a part of this patient's care.  Paulina Fusi, PharmD, BCPS 09/17/2020 8:08 PM

## 2020-09-17 NOTE — ED Notes (Signed)
Date and time results received: 09/17/20 12:18  (use smartphrase ".now" to insert current time)  Test: glucose  Critical Value: 24  Name of Provider Notified: dr Tamala Julian  Orders Received? Or Actions Taken?: no new orders at this time

## 2020-09-17 NOTE — ED Notes (Signed)
Patient having X-ray at bedside.

## 2020-09-17 NOTE — ED Notes (Signed)
CBG 89 

## 2020-09-17 NOTE — Progress Notes (Signed)
Refused lab draw, requesting pain meds. I notified  NP of both

## 2020-09-17 NOTE — ED Notes (Signed)
IV team at bedside 

## 2020-09-17 NOTE — Consult Note (Signed)
Cardiology Consultation Note    Patient ID: [name], MRN: [id number], DOB/AGE: 61-17-1962 61 y.o. Admit date: 09/17/2020   Date of Consult: 09/17/2020 Primary Physician: [facility] [place] Primary Cardiologist: Dr. Nehemiah Massed  Chief Complaint: weakness and reduced mentation Reason for Consultation: bradycardia Requesting MD: Dr. Orpah Melter  HPI: [name] is a 59 y.o. female with history of end-stage renal disease on hemodialysis Monday Wednesday and Friday, diastolic heart failure, anemia, coronary disease, renal cell carcinoma, hypertension, COPD, depression who presented to the emergency room complaints of unresponsiveness.  She was noted to have sinus bradycardia.  She was brought to the emergency room where she was noted to be hypoglycemic with a glucose of 24, creatinine of 4.84.  Initial troponin was 4 and is remained at 4.  BNP was 620.  Urine drug screen was negative.  Arterial blood gas pH 7.35 PCO2 59 PO2 of 158.  EKG showed sinus bradycardia versus ventricular escape at the upper 30s and low 40s.  Head CT showed no acute abnormalities.  Chest x-ray showed bilateral effusions.  She was relatively hypotensive and was placed on low-dose Levophed and epi drip.  Rectal temp is currently 90 degrees.  She is currently on [facility].  Blood pressure is currently 111/56 with a pulse of 40.  She is on amlodipine 10 mg daily as an outpatient along with aspirin.  She is also on atorvastatin 20 mg daily.  She is also discharged with carvedilol 6.25 mg twice daily.  EKG at previous admission last month showed sinus bradycardia on the 60s.  She is arousable.  She has a history of coronary status post PCI in the past-last catheter 2013 showing noncritical disease.  Limited echo in September of this year showed preserved LV function.  TEE in September 2021 revealed normal ejection fraction with no vegetations.  Past Medical History:  Diagnosis Date  . Anemia   . Anginal pain ([place])    . Anxiety   . Arthritis   . Asthma   . Broken wrist   . Bronchitis   . chronic diastolic CHF 0/86/7619  . Chronic kidney disease   . COPD (chronic obstructive pulmonary disease) ([place])   . Coronary artery disease    a. cath 2013: stenting to RCA (report not available); b. cath 2014: LM nl, pLAD 40%, mLAD nl, ost LCx 40%, mid LCx nl, pRCA 30% @ site of prior stent, mRCA 50%  . Depression   . Diabetes mellitus ([place])   . Diabetes mellitus without complication ([place])   . Diabetic neuropathy ([place])   . dialysis 2006  . Diverticulosis   . Dizziness   . Dyspnea   . Elevated lipids   . Environmental and seasonal allergies   . ESRD (end stage renal disease) on dialysis ([place])    M-W-F  . Gastroparesis   . GERD (gastroesophageal reflux disease)   . Headache   . History of anemia due to chronic kidney disease   . History of hiatal hernia   . HOH (hard of hearing)   . Hx of pancreatitis 2015  . Hypertension   . Lower extremity edema   . Mitral regurgitation    a. echo 10/2013: EF 62%, noWMA, mildly dilated LA, mild to mod MR/TR, GR1DD  . Myocardial infarction ([place])   . Orthopnea   . Parathyroid abnormality ([address])   . Peripheral arterial disease ([place])   . Pneumonia   . Renal cancer ([place])   . Renal insufficiency  Pt is on dialysis on M,W + F.  . Wheezing       Surgical History:  Past Surgical History:  Procedure Laterality Date  . A/V SHUNTOGRAM Left 01/20/2018   Procedure: A/V SHUNTOGRAM;  Surgeon: Algernon Huxley, MD;  Location: [place] [facility];  Service: Cardiovascular;  Laterality: Left;  . ABDOMINAL HYSTERECTOMY  1992  . AMPUTATION TOE Left 10/02/2017   Procedure: AMPUTATION TOE-LEFT GREAT TOE;  Surgeon: Albertine Patricia, DPM;  Location: [facility];  Service: Podiatry;  Laterality: Left;  . APPENDECTOMY    . APPLICATION OF WOUND VAC N/A 11/25/2019   Procedure: APPLICATION OF WOUND VAC;  Surgeon: Katha Cabal, MD;  Location: [facility];  Service: Vascular;  Laterality: N/A;   . ARTERY BIOPSY Right 10/11/2018   Procedure: BIOPSY TEMPORAL ARTERY;  Surgeon: Vickie Epley, MD;  Location: [facility];  Service: General;  Laterality: Right;  . CARDIAC CATHETERIZATION Left 07/26/2015   Procedure: Left Heart Cath and Coronary Angiography;  Surgeon: Dionisio David, MD;  Location: [place] [facility];  Service: Cardiovascular;  Laterality: Left;  . CATARACT EXTRACTION W/ INTRAOCULAR LENS IMPLANT Right   . CATARACT EXTRACTION W/PHACO Left 03/10/2017   Procedure: CATARACT EXTRACTION PHACO AND INTRAOCULAR LENS PLACEMENT (IOC);  Surgeon: Birder Robson, MD;  Location: [facility];  Service: Ophthalmology;  Laterality: Left;  [place] 00:51.9 AP% 14.2 CDE 7.39 fluid pack lot # [id number]  . CENTRAL LINE INSERTION Right 11/11/2019   Procedure: CENTRAL LINE INSERTION;  Surgeon: Katha Cabal, MD;  Location: [facility];  Service: Vascular;  Laterality: Right;  . CENTRAL LINE INSERTION  11/25/2019   Procedure: CENTRAL LINE INSERTION;  Surgeon: Katha Cabal, MD;  Location: [facility];  Service: Vascular;;  . CHOLECYSTECTOMY    . COLONOSCOPY WITH PROPOFOL N/A 08/12/2016   Procedure: COLONOSCOPY WITH PROPOFOL;  Surgeon: Lollie Sails, MD;  Location: [facility];  Service: Endoscopy;  Laterality: N/A;  . DIALYSIS FISTULA CREATION Left    upper arm  . dialysis grafts    . DIALYSIS/PERMA CATHETER INSERTION N/A 11/14/2019   Procedure: DIALYSIS/PERMA CATHETER INSERTION;  Surgeon: Algernon Huxley, MD;  Location: [place] [facility];  Service: Cardiovascular;  Laterality: N/A;  . DIALYSIS/PERMA CATHETER INSERTION N/A 02/03/2020   Procedure: DIALYSIS/PERMA CATHETER INSERTION;  Surgeon: Katha Cabal, MD;  Location: [place] [facility];  Service: Cardiovascular;  Laterality: N/A;  . DIALYSIS/PERMA CATHETER INSERTION N/A 06/19/2020   Procedure: DIALYSIS/PERMA CATHETER INSERTION;  Surgeon: Katha Cabal, MD;  Location: [place] [facility];  Service: Cardiovascular;  Laterality: N/A;   . DIALYSIS/PERMA CATHETER REMOVAL N/A 05/25/2020   Procedure: DIALYSIS/PERMA CATHETER REMOVAL;  Surgeon: Katha Cabal, MD;  Location: [place] [facility];  Service: Cardiovascular;  Laterality: N/A;  . ESOPHAGOGASTRODUODENOSCOPY N/A 03/08/2015   Procedure: ESOPHAGOGASTRODUODENOSCOPY (EGD);  Surgeon: Manya Silvas, MD;  Location: [facility] END[facility]COPY;  Service: Endoscopy;  Laterality: N/A;  . ESOPHAGOGASTRODUODENOSCOPY (EGD) WITH PROPOFOL N/A 03/18/2016   Procedure: ESOPHAGOGASTRODUODENOSCOPY (EGD) WITH PROPOFOL;  Surgeon: [name], MD;  Location: [facility]OS[facility];  Service: Endoscopy;  Laterality: N/A;  . EYE SURGERY Right 2018  . FECAL TRANSPLANT N/A 08/23/2015   Procedure: FECAL TRANSPLANT;  Surgeon: Manya Silvas, MD;  Location: [facility] ENDOSCOPY;  Service: Endoscopy;  Laterality: N/A;  . HAND SURGERY Bilateral   . HEMATOMA EVACUATION Left 11/25/2019   Procedure: EVACUATION HEMATOMA;  Surgeon: Katha Cabal, MD;  Location: [facility];  Service: Vascular;  Laterality: Left;  . I &  D EXTREMITY Left 11/25/2019   Procedure: IRRIGATION AND DEBRIDEMENT EXTREMITY;  Surgeon: Katha Cabal, MD;  Location: [facility];  Service: Vascular;  Laterality: Left;  . IR FLUORO GUIDE CV LINE RIGHT  04/06/2020  . IR INJECT/THERA/INC NEEDLE/CATH/PLC EPI/CERV/THOR [facility]  08/13/2020  . IR RADIOLOGIST EVAL & MGMT  07/28/2019  . IR RADIOLOGIST EVAL & MGMT  08/11/2019  . LIGATION OF ARTERIOVENOUS  FISTULA Left 11/11/2019  . LIGATION OF ARTERIOVENOUS  FISTULA Left 11/11/2019   Procedure: LIGATION OF ARTERIOVENOUS  FISTULA;  Surgeon: Katha Cabal, MD;  Location: [facility];  Service: Vascular;  Laterality: Left;  . LIGATIONS OF HERO GRAFT Right 06/13/2020   Procedure: LIGATION / REMOVAL OF RIGHT HERO GRAFT;  Surgeon: Katha Cabal, MD;  Location: [facility];  Service: Vascular;  Laterality: Right;  . PERIPHERAL VASCULAR CATHETERIZATION N/A 12/20/2015   Procedure: Thrombectomy of dialysis access versus permcath  placement;  Surgeon: Algernon Huxley, MD;  Location: [place] [facility];  Service: Cardiovascular;  Laterality: N/A;  . PERIPHERAL VASCULAR CATHETERIZATION N/A 12/20/2015   Procedure: A/V Shunt Intervention;  Surgeon: Algernon Huxley, MD;  Location: [place] [facility];  Service: Cardiovascular;  Laterality: N/A;  . PERIPHERAL VASCULAR CATHETERIZATION N/A 12/20/2015   Procedure: A/V Shuntogram/Fistulagram;  Surgeon: Algernon Huxley, MD;  Location: [place] [facility];  Service: Cardiovascular;  Laterality: N/A;  . PERIPHERAL VASCULAR CATHETERIZATION N/A 01/02/2016   Procedure: A/V Shuntogram/Fistulagram;  Surgeon: Algernon Huxley, MD;  Location: [place] [facility];  Service: Cardiovascular;  Laterality: N/A;  . PERIPHERAL VASCULAR CATHETERIZATION N/A 01/02/2016   Procedure: A/V Shunt Intervention;  Surgeon: Algernon Huxley, MD;  Location: [place] [facility];  Service: Cardiovascular;  Laterality: N/A;  . TEE WITHOUT CARDIOVERSION N/A 06/11/2020   Procedure: TRANSESOPHAGEAL ECHOCARDIOGRAM (TEE);  Surgeon: Teodoro Spray, MD;  Location: [facility];  Service: Cardiovascular;  Laterality: N/A;  . UPPER EXTREMITY VENOGRAPHY Right 01/18/2020   Procedure: UPPER EXTREMITY VENOGRAPHY;  Surgeon: Katha Cabal, MD;  Location: [place] [facility];  Service: Cardiovascular;  Laterality: Right;  Marland Kitchen VASCULAR ACCESS DEVICE INSERTION Right 04/13/2020   Procedure: INSERTION OF HERO VASCULAR ACCESS DEVICE (GRAFT);  Surgeon: Katha Cabal, MD;  Location: [facility];  Service: Vascular;  Laterality: Right;     Home Meds: Prior to Admission medications   Medication Sig Start Date End Date Taking? Authorizing Provider  albuterol (PROVENTIL) (2.5 MG/3ML) 0.083% nebulizer solution Take 2.5 mg by nebulization every 4 (four) hours as needed for wheezing or shortness of breath.    [provider]  albuterol (VENTOLIN HFA) 108 (90 Base) MCG/ACT inhaler Inhale 2 puffs into the lungs every 4 (four) hours as needed for wheezing  or shortness of breath. 11/09/19   [provider]  alum & mag hydroxide-simeth (MAALOX/MYLANTA) 200-200-20 MG/5ML suspension Take 15 mLs by mouth every 6 (six) hours as needed for indigestion or heartburn. 06/25/20   Enzo Bi, MD  amLODipine (NORVASC) 10 MG tablet Take 1 tablet (10 mg total) by mouth daily. 06/25/20   Enzo Bi, MD  ammonium lactate (LAC-HYDRIN) 12 % lotion Apply 1 application topically 2 (two) times daily as needed for dry skin.     [provider]  Ascorbic Acid (VITA-C PO) Take 1 tablet by mouth daily.    [provider]  aspirin EC 81 MG tablet Take 81 mg by mouth daily.    [provider]  atorvastatin (LIPITOR) 20 MG tablet Take 20 mg  by mouth every evening.  10/24/19   [provider]  calcium carbonate (TUMS - DOSED IN MG ELEMENTAL CALCIUM) 500 MG chewable tablet Chew 2.5 tablets (500 mg of elemental calcium total) by mouth every 6 (six) hours as needed for indigestion. 06/25/20   Enzo Bi, MD  carvedilol (COREG) 6.25 MG tablet Take 6.25 mg by mouth 2 (two) times daily with a meal.    [provider]  cefTAZidime (FORTAZ) IVPB Inject 1 g into the vein every Monday, Wednesday, and Friday with hemodialysis. Indication: Thoracic vertebral osteomyelitis with disciits First Dose: Yes Last Day of Therapy:  10/10/20 Labs - Once weekly:  CBC/D, CMP, ESR, vancomycin trough   Method of administration may be changed at the discretion of home infusion pharmacist based upon assessment of the patient and/or caregiver's ability to self-administer the medication ordered. 08/17/20   Wyvonnia Dusky, MD  Cholecalciferol (VITAMIN D3) 25 MCG (1000 UT) CAPS Take 1,000 Units by mouth daily.     [provider]  cholestyramine light (PREVALITE) 4 g packet Take 4 g by mouth 2 (two) times daily.    [provider]  CREON 12000 units CPEP capsule Take 36,000 Units by mouth 3 (three) times daily before meals.  10/21/19   [provider]  cyanocobalamin 1000 MCG tablet Take 1,000 mcg by mouth daily.     [provider]  diphenhydrAMINE (BENADRYL) 25 MG tablet Take 25 mg by mouth every 6 (six) hours as needed for itching.    [provider]  epoetin alfa (EPOGEN) 10000 UNIT/ML injection Inject 1 mL (10,000 Units total) into the vein every Monday, Wednesday, and Friday with hemodialysis. 06/27/20   Enzo Bi, MD  feeding supplement, ENSURE ENLIVE, (ENSURE ENLIVE) LIQD Take 237 mLs by mouth daily. 06/26/20   Enzo Bi, MD  Ferrous Sulfate (IRON) 325 (65 Fe) MG TABS Take 325 mg by mouth daily.     [provider]  furosemide (LASIX) 80 MG tablet Take 80 mg by mouth daily. 05/08/20   [provider]  gabapentin (NEURONTIN) 100 MG capsule Take 100 mg by mouth every evening.     [provider]  hydrALAZINE (APRESOLINE) 50 MG tablet Take 1 tablet (50 mg total) by mouth every 8 (eight) hours. 06/25/20   Enzo Bi, MD  HYDROcodone-acetaminophen (NORCO/VICODIN) 5-325 MG tablet Take 1 tablet by mouth 3 (three) times daily as needed for moderate pain or severe pain. 07/26/20   [provider]  hydrOXYzine (ATARAX/VISTARIL) 25 MG tablet Take 25 mg by mouth daily.     [provider]  multivitamin (RENA-VIT) TABS tablet Take 1 tablet by mouth daily.     [provider]  mupirocin ointment (BACTROBAN) 2 % Apply 1 application topically daily as needed (leg rash).  11/09/19   [provider]  nitroGLYCERIN (NITROSTAT) 0.4 MG SL tablet Place 0.4 mg under the tongue every 5 (five) minutes as needed for chest pain.     [provider]  Omega-3 300 MG CAPS Take 300 mg by mouth 2 (two) times daily. Every other day opposite omega XL    [provider]  pantoprazole (PROTONIX) 40 MG tablet Take 40 mg by mouth daily. 08/28/19   [provider]  polyethylene glycol (MIRALAX / GLYCOLAX) 17 g packet Take 17 g by mouth daily. 06/25/20   Enzo Bi,  MD  Probiotic Product (PROBIOTIC PO) Take 1 capsule by mouth daily.    [provider]  promethazine (PHENERGAN)  25 MG tablet Take 1 tablet (25 mg total) by mouth every 6 (six) hours as needed for nausea or vomiting. 06/25/20   Enzo Bi, MD  sevelamer carbonate (RENVELA) 800 MG tablet Take 800 mg by mouth 4 (four) times daily. (at meals and with a snack)    [provider]  vancomycin IVPB Inject 1,000 mg into the vein every Monday, Wednesday, and Friday with hemodialysis. Indication:  Thoracic vertebral osteomyelitis with disciits First Dose: Yes Last Day of Therapy:  10/10/20 Labs - Weekly:  CBC/D, CMP, vancomycin trough, ESR Method of administration:Elastomeric Method of administration may be changed at the discretion of the patient and/or caregiver's ability to self-administer the medication ordered. 08/17/20   Wyvonnia Dusky, MD    Inpatient Medications:  . sodium chloride flush  3 mL Intravenous Q12H   . sodium chloride    . norepinephrine (LEVOPHED) Adult infusion    . piperacillin-tazobactam 3.375 g (09/17/20 1248)    Allergies:  Allergies  Allergen Reactions  . Ace Inhibitors Swelling and Anaphylaxis  . Ativan [Lorazepam] Other (See Comments)    Reaction:  Hallucinations and headaches  . Compazine [Prochlorperazine Edisylate] Anaphylaxis, Nausea And Vomiting and Other (See Comments)    Other reaction(s): dystonia from this vs. Reglan, 23 Jul - patient relates that she takes promethazine frequently with no problems  . Sumatriptan Succinate Other (See Comments)    Other reaction(s): delirium and hallucinations per [facility] records  . Zofran [Ondansetron] Nausea And Vomiting    Per pt. she is allergic to zofran or will experience adverse reaction like hallucination   . Losartan Nausea Only  . Prochlorperazine Other (See Comments)    Reaction:  Unknown . Patient does not remember reaction but she does have vertigo and anxiety along with n and v at times. Could  be used to treat any of these   . Reglan [Metoclopramide] Other (See Comments)    Per patient her Dr. Evelina Bucy her off it   . Scopolamine Other (See Comments)    Dizziness, also has vertigo already  . Tape Rash    Plastic tape causes rash  . Tapentadol Rash    Social History   Socioeconomic History  . Marital status: Divorced    Spouse name: Not on file  . Number of children: Not on file  . Years of education: Not on file  . Highest education level: Not on file  Occupational History  . Not on file  Tobacco Use  . Smoking status: Never Smoker  . Smokeless tobacco: Never Used  Vaping Use  . Vaping Use: Never used  Substance and Sexual Activity  . Alcohol use: Not Currently    Comment: glass wine week per pt  . Drug use: Yes    Types: Marijuana    Comment: once a day  . Sexual activity: Never  Other Topics Concern  . Not on file  Social History Narrative   ** Merged History Encounter **       Social Determinants of Health   Financial Resource Strain: Not on file  Food Insecurity: Not on file  Transportation Needs: Not on file  Physical Activity: Not on file  Stress: Not on file  Social Connections: Not on file  Intimate Partner Violence: Not on file     Family History  Problem Relation Age of Onset  . Kidney disease Mother   . Diabetes Mother   . Cancer Father   . Kidney disease Sister  Review of Systems: A 12-system review of systems was performed and is negative except as noted in the HPI.  Labs: No results for input(s): CKTOTAL, CKMB, TROPONINI in the last 72 hours. Lab Results  Component Value Date   WBC 4.0 09/17/2020   HGB 9.1 (L) 09/17/2020   HCT 27.9 (L) 09/17/2020   MCV 88.0 09/17/2020   PLT 144 (L) 09/17/2020    Recent Labs  Lab 09/17/20 1124  NA 140  K 3.4*  CL 101  CO2 31  BUN 15  CREATININE 4.84*  CALCIUM 12.0*  PROT 6.2*  BILITOT 0.5  ALKPHOS 62  ALT 12  AST 14*  GLUCOSE 24*   Lab Results  Component Value Date   CHOL  107 08/11/2020   HDL 69 08/11/2020   LDLCALC 29 08/11/2020   TRIG 43 08/11/2020   No results found for: DDIMER  Radiology/Studies:  DG Chest 1 View  Result Date: 09/17/2020 CLINICAL DATA:  Altered mental status EXAM: CHEST  1 VIEW COMPARISON:  08/10/2020 FINDINGS: Mild cardiac enlargement. Diffuse bilateral airspace disease is symmetric and has progressed in the interval. Small bilateral pleural effusions. Dual lumen central venous catheter tip in the right atrium unchanged. Left subclavian vascular stent unchanged. IMPRESSION: Progression of diffuse bilateral airspace disease most likely edema. Small bilateral effusions. Electronically Signed   By: Franchot Gallo M.D.   On: 09/17/2020 11:36    Wt Readings from Last 3 Encounters:  09/17/20 83 kg  08/17/20 82 kg  07/24/20 80.3 kg    EKG: sinus bradcardia  Physical Exam:critically ill female Blood pressure (!) 104/46, pulse (!) 38, resp. rate 13, height _0  (1.651 m), weight 83 kg, SpO2 100 %. Body mass index is 30.45 kg/m. General: Well developed, well nourished, in no acute distress. Head: Normocephalic, atraumatic, sclera non-icteric, no xanthomas, nares are without discharge.  Neck: Negative for carotid bruits. JVD not elevated. Lungs: Clear bilaterally to auscultation without wheezes, rales, or rhonchi. Breathing is unlabored. Heart: bradycardic Abdomen: Soft, non-tender, non-distended with normoactive bowel sounds. No hepatomegaly. No rebound/guarding. No obvious abdominal masses. Msk:  Strength and tone appear normal for age. Extremities: No clubbing or cyanosis. No edema.  Distal pedal pulses are 2+ and equal bilaterally. Neuro: respnds to verbal and noxious stimuli     Assessment and Plan  59 year old female with multiple medical problems including end-stage renal disease, diabetes, HFpEF with an EF around 40 to 50%, history of insignificant coronary disease, hypertension who was admitted after presenting to the ER  being noted by family to be less responsive.  She was minimally responsive with a glucose of 24 in the ER.  She was in sinus bradycardia with heart rates in the upper 30s to lower 40s.  Of note she is on carvedilol as an outpatient.  No obvious source of infectious process however cultures are pending.  Is of note that she was here several months ago with Enterococcus in her blood cultures.  TEE was unremarkable.  1.  Bradycardia-likely multifactorial.  Patient is significantly hypoglycemic and hypothermic.  She also was on carvedilol 6.25 twice daily as an outpatient.  This is been held.  She is being warmed.  Her hypoglycemia is being reversed.  Cultures are pending.  We will continue with epi and Levophed for now to support pressure and heart rate as she warms up.  She likely will improve when warmer.  Also washing out of the carvedilol will be helpful  2.  Hypothermic/hyperglycemic-etiology unclear.  Sepsis is  of concern.  After cultures are obtained would proceed with broad-spectrum antibiotics.  3.  Evidence of effusions.  Had preserved LV function by echo several months ago.  Troponins are negative.  This does not appear to be ischemia.  Further recommendations pending course.  We will follow along with you closely. Teodoro Spray MD 09/17/2020, 12:50 PM Pager: [phone]

## 2020-09-17 NOTE — ED Triage Notes (Signed)
BIB Ems per family was well yesterday at 8pm last night after which they tried to communicate with her but wasn't answering. Family went to her home this am and found her unresponsive. Pt a dialysis patient. Per ems family states patient has a hx of over using her pain medication.. pt responding to noxious stimuli in ed. Cardiac pads place on patient. IO placed by Dr Tamala Julian right leg.

## 2020-09-17 NOTE — Progress Notes (Signed)
[facility] visite dpt. per OR for AD education.  Per chart review, [place] remembered he assisted pt. in completing AD during prior admission, but notarization was not possible at the time.  When [facility] arrived, pt. lying in bed, awake and agreeable to visit.  Pt. requested prayer; after prayer [place] asked about AD --> suggested pt. have her son bring AD completed last time to hospital if possible.  Pt. will contact son.

## 2020-09-17 NOTE — ED Notes (Signed)
Pt being transported to CT by Gerald Stabs, RN then to ICU

## 2020-09-17 NOTE — ED Notes (Signed)
Will administer medication once IV team is finished. Unsure if IO is working properly.

## 2020-09-17 NOTE — Progress Notes (Signed)
[facility]  ROUNDING NOTE   Subjective:  Patient well-known to [place] from prior admissions. We also follow her for outpatient hemodialysis. Patient found unresponsive by her family earlier this a.m. Found to be hypothermic, bradycardic, and hypotensive during evaluation in the emergency department.   Objective:  Vital signs in last 24 hours:  Temp:  [90 F (32.2 C)] 90 F (32.2 C) (12/27 1438) Pulse Rate:  [35-45] 40 (12/27 1424) Resp:  [8-19] 9 (12/27 1424) BP: (83-140)/(42-70) 130/51 (12/27 1420) SpO2:  [86 %-100 %] 100 % (12/27 1424) Weight:  [83 kg] 83 kg (12/27 1117)  Weight change:  Filed Weights   09/17/20 1117  Weight: 83 kg    Intake/Output: No intake/output data recorded.   Intake/Output this shift:  No intake/output data recorded.  Physical Exam: General:  Critically ill-appearing  Head:  Normocephalic, atraumatic. Moist oral mucosal membranes  Eyes:  Anicteric  Neck:  Supple  Lungs:   Clear to auscultation, normal effort  Heart:  S1S2 bradycardic, heart rate in the 30s  Abdomen:   Soft, nontender, bowel sounds present  Extremities:  1+ peripheral edema.  Neurologic:  Obtunded, not following commands  Skin:  Cold to the touch  Access:  PermCath in place    Basic Metabolic Panel: Recent Labs  Lab 09/17/20 1124  NA 140  K 3.4*  CL 101  CO2 31  GLUCOSE 24*  BUN 15  CREATININE 4.84*  CALCIUM 12.0*    Liver Function Tests: Recent Labs  Lab 09/17/20 1124  AST 14*  ALT 12  ALKPHOS 62  BILITOT 0.5  PROT 6.2*  ALBUMIN 2.6*   No results for input(s): LIPASE, AMYLASE in the last 168 hours. Recent Labs  Lab 09/17/20 1115  AMMONIA 21    CBC: Recent Labs  Lab 09/17/20 1124  WBC 4.0  NEUTROABS 1.0*  HGB 9.1*  HCT 27.9*  MCV 88.0  PLT 144*    Cardiac Enzymes: No results for input(s): CKTOTAL, CKMB, CKMBINDEX, TROPONINI in the last 168 hours.  BNP: Invalid input(s): POCBNP  CBG: Recent Labs  Lab 09/17/20 1234  09/17/20 1322 09/17/20 1354  GLUCAP 89 26* 66*    Microbiology: Results for orders placed or performed during the hospital encounter of 09/17/20  Resp Panel by RT-PCR (Flu A&B, Covid) Nasopharyngeal Swab     Status: None   Collection Time: 09/17/20 11:24 AM   Specimen: Nasopharyngeal Swab; Nasopharyngeal(NP) swabs in vial transport medium  Result Value Ref Range Status   SARS Coronavirus 2 by RT PCR NEGATIVE NEGATIVE Final    Comment: (NOTE) SARS-CoV-2 target nucleic acids are NOT DETECTED.  The SARS-CoV-2 RNA is generally detectable in upper respiratory specimens during the acute phase of infection. The lowest concentration of SARS-CoV-2 viral copies this assay can detect is 138 copies/mL. A negative result does not preclude SARS-Cov-2 infection and should not be used as the sole basis for treatment or other patient management decisions. A negative result may occur with  improper specimen collection/handling, submission of specimen other than nasopharyngeal swab, presence of viral mutation(s) within the areas targeted by this assay, and inadequate number of viral copies(<138 copies/mL). A negative result must be combined with clinical observations, patient history, and epidemiological information. The expected result is Negative.  Fact Sheet for Patients:  EntrepreneurPulse.com.au  Fact Sheet for Healthcare Providers:  IncredibleEmployment.be  This test is no t yet approved or cleared by the [place] FDA and  has been authorized for detection and/or diagnosis of SARS-CoV-2  by FDA under an Emergency Use Authorization (EUA). This EUA will remain  in effect (meaning this test can be used) for the duration of the COVID-19 declaration under Section 564(b)(1) of the Act, 21 U.S.C.section 360bbb-3(b)(1), unless the authorization is terminated  or revoked sooner.       Influenza A by PCR NEGATIVE NEGATIVE Final   Influenza B by PCR  NEGATIVE NEGATIVE Final    Comment: (NOTE) The Xpert Xpress SARS-CoV-2/FLU/RSV plus assay is intended as an aid in the diagnosis of influenza from Nasopharyngeal swab specimens and should not be used as a sole basis for treatment. Nasal washings and aspirates are unacceptable for Xpert Xpress SARS-CoV-2/FLU/RSV testing.  Fact Sheet for Patients: EntrepreneurPulse.com.au  Fact Sheet for Healthcare Providers: IncredibleEmployment.be  This test is not yet approved or cleared by the [place] FDA and has been authorized for detection and/or diagnosis of SARS-CoV-2 by FDA under an Emergency Use Authorization (EUA). This EUA will remain in effect (meaning this test can be used) for the duration of the COVID-19 declaration under Section 564(b)(1) of the Act, 21 U.S.C. section 360bbb-3(b)(1), unless the authorization is terminated or revoked.  Performed at [facility], [address] [place], [place] [address]     Coagulation Studies: Recent Labs    09/17/20 1124  LABPROT 14.5  INR 1.2    Urinalysis: Recent Labs    09/17/20 1124  COLORURINE YELLOW*  LABSPEC 1.011  PHURINE 7.0  GLUCOSEU NEGATIVE  HGBUR NEGATIVE  BILIRUBINUR NEGATIVE  KETONESUR NEGATIVE  PROTEINUR >=300*  NITRITE NEGATIVE  LEUKOCYTESUR NEGATIVE      Imaging: DG Chest 1 View  Result Date: 09/17/2020 CLINICAL DATA:  Altered mental status EXAM: CHEST  1 VIEW COMPARISON:  08/10/2020 FINDINGS: Mild cardiac enlargement. Diffuse bilateral airspace disease is symmetric and has progressed in the interval. Small bilateral pleural effusions. Dual lumen central venous catheter tip in the right atrium unchanged. Left subclavian vascular stent unchanged. IMPRESSION: Progression of diffuse bilateral airspace disease most likely edema. Small bilateral effusions. Electronically Signed   By: Franchot Gallo M.D.   On: 09/17/2020 11:36     Medications:   . sodium  chloride    . epinephrine 2.5 mcg/min (09/17/20 1443)  . norepinephrine (LEVOPHED) Adult infusion Stopped (09/17/20 1357)   . heparin  5,000 Units Subcutaneous Q8H  . sodium chloride flush  3 mL Intravenous Q12H   sodium chloride, docusate sodium, polyethylene glycol, sodium chloride flush  Assessment/ Plan:  59 y.o. female with past medical history of ESRD on HD MWF disease, secondary, chronic diastolic heart failure, diabetes mellitus type 2, thoracic discitis who presents now with hypothermia, hypoglycemia, hypotension, bradycardia.  1.  ESRD on HD MWF.  Patient currently bradycardic.  Hold off on dialysis until she stabilizes a bit further.  May need to consider CRRT if she remains unstable.  2.  Hypotension/bradycardia/hypoglycemia.  Currently on epinephrine and norepinephrine drip.  Recommend cardiology consultation for the bradycardia as well.  3.  Anemia of chronic kidney disease.  Continue to monitor CBC over the course of the hospitalization.  4.  Secondary hyperparathyroidism.  Monitor bone mineral metabolism parameters over the course of the hospitalization.  5.  Overall prognosis guarded.   LOS: 0 Tandre Conly 12/27/20212:43 PM

## 2020-09-17 NOTE — Progress Notes (Signed)
Pharmacy Antibiotic Note  [name] is a 59 y.o. female admitted on 09/17/2020 with pneumonia and bacteremia.  Pharmacy has been consulted for Cefepime and Vancomycin dosing. Patient is ESRD on HD on Monday, Wednesday, Friday schedule. She was receiving Vancomycin 1g IV with each dialysis session prior to admission. Did not get dialysis this morning and nephrology is holding on dialysis until she stabilizes. May consider if CRRT if remains unstable.  Plan: Will order a random Vancomycin level to see where we are. If subtherapeutic will proceed with Vancomycin 1g x 1 dose and then follow up on plan for HD vs CRRT.  Cefepime 2g IV q12h, will need to follow up on HD vs CRRT plan.  Height: 5\' 5"  (165.1 cm) Weight: 83 kg (182 lb 15.7 oz) IBW/kg (Calculated) : 57  Temp (24hrs), Avg:90.3 F (32.4 C), Min:90 F (32.2 C), Max:90.5 F (32.5 C)  Recent Labs  Lab 09/17/20 1124  WBC 4.0  CREATININE 4.84*  LATICACIDVEN 0.7    Estimated Creatinine Clearance: 13 mL/min (A) (by C-G formula based on SCr of 4.84 mg/dL (H)).    Allergies  Allergen Reactions  . Ace Inhibitors Swelling and Anaphylaxis  . Ativan [Lorazepam] Other (See Comments)    Reaction:Hallucinations and headaches  . Compazine [Prochlorperazine Edisylate] Anaphylaxis, Nausea And Vomiting and Other (See Comments)    Other reaction(s): dystonia from this vs. Reglan, 23 Jul - patient relates that she takes promethazine frequently with no problems  . Sumatriptan Succinate Other (See Comments)    Other reaction(s): delirium and hallucinations per [facility] records  . Zofran [Ondansetron] Nausea And Vomiting    Per pt. she is allergic to zofran or will experience adverse reaction like hallucination   . Losartan Nausea Only  . Prochlorperazine Other (See Comments)    Reaction:  Unknown . Patient does not remember reaction but she does have vertigo and anxiety along with n and v at times. Could be used to treat any of these   . Reglan  [Metoclopramide] Other (See Comments)    Per patient her Dr. Evelina Bucy her off it   . Scopolamine Other (See Comments)    Dizziness, also has vertigo already  . Tape Rash    Plastic tape causes rash  . Tapentadol Rash    Antimicrobials this admission: Vancomycin  12/27 >>  Cefepime 12/27 >>   Dose adjustments this admission:  Microbiology results:   Thank you for allowing pharmacy to be a part of this patient's care.  Paulina Fusi, PharmD, BCPS 09/17/2020 5:23 PM

## 2020-09-17 NOTE — ED Notes (Addendum)
CBG 26. MD notified

## 2020-09-17 NOTE — ED Notes (Signed)
Rectal temp 30f patient placed on bear hugger md made aware.

## 2020-09-17 NOTE — ED Notes (Signed)
Internal medicine doctor at bedside notified about blood sugar of 69.

## 2020-09-17 NOTE — ED Notes (Signed)
MD attempting US IV.

## 2020-09-17 NOTE — H&P (Signed)
NAME:  KENNIA VANVORST, MRN:  [id number], DOB:  1960/11/26, LOS: 0 ADMISSION DATE:  09/17/2020, CONSULTATION DATE: 09-02-2020 REFERRING MD: Hulan Saas, CHIEF COMPLAINT: Encephalopathy  Brief History:  Patient found unresponsive by family.  Found to be bradycardic in the ED and hypoglycemic.  Significant past medical history of ESRD HD Monday Wednesday Friday, diastolic heart failure, anemia, coronary artery disease, renal cell cancer, pancreatitis, hypertension, hyperlipidemia, TIA, diabetes controlled by diet, COPD, asthma, depression, anxiety  History of Present Illness:  Patient found unresponsive by family.  Found to be bradycardic in the ED and hypoglycemic.  Significant past medical history of ESRD HD Monday Wednesday Friday, diastolic heart failure, anemia, coronary artery disease, renal cell cancer, pancreatitis, hypertension, hyperlipidemia, TIA, diabetes controlled by diet, COPD, asthma, depression, anxiety.  The story goes that family came and saw patient this morning and found her unresponsive.  According to them she was well at 8 PM last night.  Previously patient has been known to overuse her pain medications.   Patient confused as to why she is here upon speaking with her.  She is oriented to self and place but not to time and situation.  She states that she has cold extremities.  Does not endorse any other complaints.  Although again patient confused and intermittently following asleep during the encounter.  Patient with normal potassium to low at 3.4.  Patient also with elevated creatinine but she is end-stage renal disease.  CBC by enlarge reassuring.  Chest x-ray concerning for pulmonary edema.  TSH in the ED reassuring.  Calcium elevated at 12.0 after patient had been given calcium for cardiac stabilization   Past Medical History:  ESRD Diastolic heart failure CAD Renal cell cancer COPD Asthma Depression Anxiety  Significant Hospital Events:  Patient found to be  bradycardic and hypoglycemic  Consults:  Cardiology on board  will engage nephrology as well  Procedures:   NA Significant Diagnostic Tests:  NA  Micro Data:  Pending  Antimicrobials:  Vanc and zosyn  Interim History / Subjective:  NA  Objective   Blood pressure (!) 130/51, pulse (!) 40, resp. rate (!) 9, height 5' 5"  (1.651 m), weight 83 kg, SpO2 100 %.       No intake or output data in the 24 hours ending 09/17/20 1433 Filed Weights   09/17/20 1117  Weight: 83 kg    Examination: General: Patient interactive intermittently but will fall asleep HENT: Pupils round and reactive but they are miotic Lungs: No crackles appreciated Cardiovascular: Bradycardic no murmurs noted Abdomen: Soft nontender nondistended Extremities: Warm to touch mild pitting edema in the lower extremities consistent with previously known lymphedema Neuro: Sleepy but arousable no focal deficits appreciated GU: Deferred  Resolved Hospital Problem list   Not applicable  Assessment & Plan:  This is a 59 year old with history as noted above who presents for chief complaint of altered mental status  Encephalopathy-differential is broad.  Does have hyper Calcemia but questioning whether this was from her calcium administration on arrival.  TSH normal. Sepsis on differetial as well -Patient needs head CT has been ordered -Could be from symptomatic bradycardia from beta-blocker overdose -Start epinephrine -IV glucagon -Cardiology is on board and has been consulted in the ED will continue to follow up recommendations -We will repeat CMP on admission and replete potassium if needed additionally will discuss with nephrology about patient's HD needs and potential treatment of hypercalcemia if remains elevated -Broad spectrum abx follow up cx's   Bradycardia-differential broad.  Per report seem like sinus bradycardia from cardiologist.  Agree that on monitor can see P waves and they do appear consistent  with sinus bradycardia. -Perform EKG on admission to the ICU -FU cards recs -Epi for now. Can use atropine if needed.   ESRD- MWF -Follow-up -Nephro consult   Best practice (evaluated daily)  Diet: N.p.o. for now Pain/Anxiety/Delirium protocol (if indicated): Not indicated VAP protocol (if indicated): Head of bed at 30 degrees DVT prophylaxis: Heparin prophylaxis GI prophylaxis: Not indicated Glucose control: CBG every 2 Mobility: Bed rest Disposition: ICU  Goals of Care:  Last date of multidisciplinary goals of care discussion: Not performed Family and staff present: Not performed Summary of discussion: Not performed Follow up goals of care discussion due: Not performed Code Status: Full code for now  Labs   CBC: Recent Labs  Lab 09/17/20 1124  WBC 4.0  NEUTROABS 1.0*  HGB 9.1*  HCT 27.9*  MCV 88.0  PLT 144*    Basic Metabolic Panel: Recent Labs  Lab 09/17/20 1124  NA 140  K 3.4*  CL 101  CO2 31  GLUCOSE 24*  BUN 15  CREATININE 4.84*  CALCIUM 12.0*   GFR: Estimated Creatinine Clearance: 13 mL/min (A) (by C-G formula based on SCr of 4.84 mg/dL (H)). Recent Labs  Lab 09/17/20 1124  WBC 4.0  LATICACIDVEN 0.7    Liver Function Tests: Recent Labs  Lab 09/17/20 1124  AST 14*  ALT 12  ALKPHOS 62  BILITOT 0.5  PROT 6.2*  ALBUMIN 2.6*   No results for input(s): LIPASE, AMYLASE in the last 168 hours. Recent Labs  Lab 09/17/20 1115  AMMONIA 21    ABG    Component Value Date/Time   PHART 7.35 09/17/2020 1125   PCO2ART 59 (H) 09/17/2020 1125   PO2ART 158 (H) 09/17/2020 1125   HCO3 32.6 (H) 09/17/2020 1125   TCO2 32 04/13/2020 0705   ACIDBASEDEF 0.6 11/11/2019 1252   O2SAT 99.3 09/17/2020 1125     Coagulation Profile: Recent Labs  Lab 09/17/20 1124  INR 1.2    Cardiac Enzymes: No results for input(s): CKTOTAL, CKMB, CKMBINDEX, TROPONINI in the last 168 hours.  HbA1C: Hemoglobin A1C  Date/Time Value Ref Range Status  07/19/2013  12:10 AM 9.3 (H) 4.2 - 6.3 % Final    Comment:    The American Diabetes Association recommends that a primary goal of therapy should be <7% and that physicians should reevaluate the treatment regimen in patients with HbA1c values consistently >8%.   04/01/2013 04:37 AM 7.6 (H) 4.2 - 6.3 % Final    Comment:    The American Diabetes Association recommends that a primary goal of therapy should be <7% and that physicians should reevaluate the treatment regimen in patients with HbA1c values consistently >8%.    Hgb A1c MFr Bld  Date/Time Value Ref Range Status  08/11/2020 01:10 AM 3.9 (L) 4.8 - 5.6 % Final    Comment:    (NOTE) Pre diabetes:          5.7%-6.4%  Diabetes:              >6.4%  Glycemic control for   <7.0% adults with diabetes   05/03/2020 03:30 PM 5.7 (H) 4.8 - 5.6 % Final    Comment:    (NOTE)         Prediabetes: 5.7 - 6.4         Diabetes: >6.4         Glycemic  control for adults with diabetes: <7.0     CBG: Recent Labs  Lab 09/17/20 1234 09/17/20 1322 09/17/20 1354  GLUCAP 89 26* 69*    Review of Systems:   Difficult to obtain pertinent positives negatives per HPI  Past Medical History:  She,  has a past medical history of Anemia, Anginal pain ([place]), Anxiety, Arthritis, Asthma, Broken wrist, Bronchitis, chronic diastolic CHF (7/84/6962), Chronic kidney disease, COPD (chronic obstructive pulmonary disease) ([place]), Coronary artery disease, Depression, Diabetes mellitus ([place]), Diabetes mellitus without complication ([place]), Diabetic neuropathy ([place]), dialysis (2006), Diverticulosis, Dizziness, Dyspnea, Elevated lipids, Environmental and seasonal allergies, ESRD (end stage renal disease) on dialysis ([place]), Gastroparesis, GERD (gastroesophageal reflux disease), Headache, History of anemia due to chronic kidney disease, History of hiatal hernia, HOH (hard of hearing), pancreatitis (2015), Hypertension, Lower extremity edema, Mitral regurgitation, Myocardial  infarction ([place]), Orthopnea, Parathyroid abnormality ([place]), Peripheral arterial disease ([place]), Pneumonia, Renal cancer ([place]), Renal insufficiency, and Wheezing.   Surgical History:   Past Surgical History:  Procedure Laterality Date  . A/V SHUNTOGRAM Left 01/20/2018   Procedure: A/V SHUNTOGRAM;  Surgeon: Algernon Huxley, MD;  Location: [place] [facility];  Service: Cardiovascular;  Laterality: Left;  . ABDOMINAL HYSTERECTOMY  1992  . AMPUTATION TOE Left 10/02/2017   Procedure: AMPUTATION TOE-LEFT GREAT TOE;  Surgeon: Albertine Patricia, DPM;  Location: [facility];  Service: Podiatry;  Laterality: Left;  . APPENDECTOMY    . APPLICATION OF WOUND VAC N/A 11/25/2019   Procedure: APPLICATION OF WOUND VAC;  Surgeon: Katha Cabal, MD;  Location: [facility];  Service: Vascular;  Laterality: N/A;  . ARTERY BIOPSY Right 10/11/2018   Procedure: BIOPSY TEMPORAL ARTERY;  Surgeon: Vickie Epley, MD;  Location: [facility];  Service: General;  Laterality: Right;  . CARDIAC CATHETERIZATION Left 07/26/2015   Procedure: Left Heart Cath and Coronary Angiography;  Surgeon: Dionisio David, MD;  Location: [place] [facility];  Service: Cardiovascular;  Laterality: Left;  . CATARACT EXTRACTION W/ INTRAOCULAR LENS IMPLANT Right   . CATARACT EXTRACTION W/PHACO Left 03/10/2017   Procedure: CATARACT EXTRACTION PHACO AND INTRAOCULAR LENS PLACEMENT (IOC);  Surgeon: Birder Robson, MD;  Location: [facility];  Service: Ophthalmology;  Laterality: Left;  [place] 00:51.9 AP% 14.2 CDE 7.39 fluid pack lot # [id number]  . CENTRAL LINE INSERTION Right 11/11/2019   Procedure: CENTRAL LINE INSERTION;  Surgeon: Katha Cabal, MD;  Location: [facility];  Service: Vascular;  Laterality: Right;  . CENTRAL LINE INSERTION  11/25/2019   Procedure: CENTRAL LINE INSERTION;  Surgeon: Katha Cabal, MD;  Location: [facility];  Service: Vascular;;  . CHOLECYSTECTOMY    . COLONOSCOPY WITH PROPOFOL N/A 08/12/2016   Procedure: COLONOSCOPY WITH  PROPOFOL;  Surgeon: Lollie Sails, MD;  Location: [facility] ENDOSCOPY;  Service: Endoscopy;  Laterality: N/A;  . DIALYSIS FISTULA CREATION Left    upper arm  . dialysis grafts    . DIALYSIS/PERMA CATHETER INSERTION N/A 11/14/2019   Procedure: DIALYSIS/PERMA CATHETER INSERTION;  Surgeon: Algernon Huxley, MD;  Location: [place] [facility];  Service: Cardiovascular;  Laterality: N/A;  . DIALYSIS/PERMA CATHETER INSERTION N/A 02/03/2020   Procedure: DIALYSIS/PERMA CATHETER INSERTION;  Surgeon: Katha Cabal, MD;  Location: [place] [facility];  Service: Cardiovascular;  Laterality: N/A;  . DIALYSIS/PERMA CATHETER INSERTION N/A 06/19/2020   Procedure: DIALYSIS/PERMA CATHETER INSERTION;  Surgeon: Katha Cabal, MD;  Location: [place] [facility];  Service: Cardiovascular;  Laterality: N/A;  . DIALYSIS/PERMA CATHETER REMOVAL N/A 05/25/2020   Procedure:  DIALYSIS/PERMA CATHETER REMOVAL;  Surgeon: Katha Cabal, MD;  Location: [place] [facility];  Service: Cardiovascular;  Laterality: N/A;  . ESOPHAGOGASTRODUODENOSCOPY N/A 03/08/2015   Procedure: ESOPHAGOGASTRODUODENOSCOPY (EGD);  Surgeon: Manya Silvas, MD;  Location: [facility] ENDOSCOPY;  Service: Endoscopy;  Laterality: N/A;  . ESOPHAGOGASTRODUODENOSCOPY (EGD) WITH PROPOFOL N/A 03/18/2016   Procedure: ESOPHAGOGASTRODUODENOSCOPY (EGD) WITH PROPOFOL;  Surgeon: [name], MD;  Location: [facility];  Service: Endoscopy;  Laterality: N/A;  . EYE SURGERY Right 2018  . FECAL TRANSPLANT N/A 08/23/2015   Procedure: FECAL TRANSPLANT;  Surgeon: Manya Silvas, MD;  Location: [facility] ENDOSCOPY;  Service: Endoscopy;  Laterality: N/A;  . HAND SURGERY Bilateral   . HEMATOMA EVACUATION Left 11/25/2019   Procedure: EVACUATION HEMATOMA;  Surgeon: Katha Cabal, MD;  Location: [facility];  Service: Vascular;  Laterality: Left;  . I & D EXTREMITY Left 11/25/2019   Procedure: IRRIGATION AND DEBRIDEMENT EXTREMITY;  Surgeon: Katha Cabal, MD;  Location:  [facility];  Service: Vascular;  Laterality: Left;  . IR FLUORO GUIDE CV LINE RIGHT  04/06/2020  . IR INJECT/THERA/INC NEEDLE/CATH/PLC EPI/CERV/THOR [facility]  08/13/2020  . IR RADIOLOGIST EVAL & MGMT  07/28/2019  . IR RADIOLOGIST EVAL & MGMT  08/11/2019  . LIGATION OF ARTERIOVENOUS  FISTULA Left 11/11/2019  . LIGATION OF ARTERIOVENOUS  FISTULA Left 11/11/2019   Procedure: LIGATION OF ARTERIOVENOUS  FISTULA;  Surgeon: Katha Cabal, MD;  Location: [facility];  Service: Vascular;  Laterality: Left;  . LIGATIONS OF HERO GRAFT Right 06/13/2020   Procedure: LIGATION / REMOVAL OF RIGHT HERO GRAFT;  Surgeon: Katha Cabal, MD;  Location: [facility];  Service: Vascular;  Laterality: Right;  . PERIPHERAL VASCULAR CATHETERIZATION N/A 12/20/2015   Procedure: Thrombectomy of dialysis access versus permcath placement;  Surgeon: Algernon Huxley, MD;  Location: [place] [facility];  Service: Cardiovascular;  Laterality: N/A;  . PERIPHERAL VASCULAR CATHETERIZATION N/A 12/20/2015   Procedure: A/V Shunt Intervention;  Surgeon: Algernon Huxley, MD;  Location: [place] [facility];  Service: Cardiovascular;  Laterality: N/A;  . PERIPHERAL VASCULAR CATHETERIZATION N/A 12/20/2015   Procedure: A/V Shuntogram/Fistulagram;  Surgeon: Algernon Huxley, MD;  Location: [place] [facility];  Service: Cardiovascular;  Laterality: N/A;  . PERIPHERAL VASCULAR CATHETERIZATION N/A 01/02/2016   Procedure: A/V Shuntogram/Fistulagram;  Surgeon: Algernon Huxley, MD;  Location: [place] [facility];  Service: Cardiovascular;  Laterality: N/A;  . PERIPHERAL VASCULAR CATHETERIZATION N/A 01/02/2016   Procedure: A/V Shunt Intervention;  Surgeon: Algernon Huxley, MD;  Location: [place] [facility];  Service: Cardiovascular;  Laterality: N/A;  . TEE WITHOUT CARDIOVERSION N/A 06/11/2020   Procedure: TRANSESOPHAGEAL ECHOCARDIOGRAM (TEE);  Surgeon: Teodoro Spray, MD;  Location: [facility];  Service: Cardiovascular;  Laterality: N/A;  . UPPER EXTREMITY VENOGRAPHY Right  01/18/2020   Procedure: UPPER EXTREMITY VENOGRAPHY;  Surgeon: Katha Cabal, MD;  Location: [place] [facility];  Service: Cardiovascular;  Laterality: Right;  Marland Kitchen VASCULAR ACCESS DEVICE INSERTION Right 04/13/2020   Procedure: INSERTION OF HERO VASCULAR ACCESS DEVICE (GRAFT);  Surgeon: Katha Cabal, MD;  Location: [facility];  Service: Vascular;  Laterality: Right;     Social History:   reports that she has never smoked. She has never used smokeless tobacco. She reports previous alcohol use. She reports current drug use. Drug: Marijuana.   Family History:  Her family history includes Cancer in her father; Diabetes in her mother; Kidney disease in her mother and sister.   Allergies Allergies  Allergen Reactions  . Ace Inhibitors Swelling and Anaphylaxis  . Ativan [Lorazepam] Other (See Comments)    Reaction:Hallucinations and headaches  . Compazine [Prochlorperazine Edisylate] Anaphylaxis, Nausea And Vomiting and Other (See Comments)    Other reaction(s): dystonia from this vs. Reglan, 23 Jul - patient relates that she takes promethazine frequently with no problems  . Sumatriptan Succinate Other (See Comments)    Other reaction(s): delirium and hallucinations per [facility] records  . Zofran [Ondansetron] Nausea And Vomiting    Per pt. she is allergic to zofran or will experience adverse reaction like hallucination   . Losartan Nausea Only  . Prochlorperazine Other (See Comments)    Reaction:  Unknown . Patient does not remember reaction but she does have vertigo and anxiety along with n and v at times. Could be used to treat any of these   . Reglan [Metoclopramide] Other (See Comments)    Per patient her Dr. Evelina Bucy her off it   . Scopolamine Other (See Comments)    Dizziness, also has vertigo already  . Tape Rash    Plastic tape causes rash  . Tapentadol Rash     Home Medications  Prior to Admission medications   Medication Sig Start Date End Date Taking? Authorizing Provider   albuterol (PROVENTIL) (2.5 MG/3ML) 0.083% nebulizer solution Take 2.5 mg by nebulization every 4 (four) hours as needed for wheezing or shortness of breath.    [provider]  albuterol (VENTOLIN HFA) 108 (90 Base) MCG/ACT inhaler Inhale 2 puffs into the lungs every 4 (four) hours as needed for wheezing or shortness of breath. 11/09/19   [provider]  alum & mag hydroxide-simeth (MAALOX/MYLANTA) 200-200-20 MG/5ML suspension Take 15 mLs by mouth every 6 (six) hours as needed for indigestion or heartburn. 06/25/20   Enzo Bi, MD  amLODipine (NORVASC) 10 MG tablet Take 1 tablet (10 mg total) by mouth daily. 06/25/20   Enzo Bi, MD  ammonium lactate (LAC-HYDRIN) 12 % lotion Apply 1 application topically 2 (two) times daily as needed for dry skin.     [provider]  Ascorbic Acid (VITA-C PO) Take 1 tablet by mouth daily.    [provider]  aspirin EC 81 MG tablet Take 81 mg by mouth daily.    [provider]  atorvastatin (LIPITOR) 20 MG tablet Take 20 mg by mouth every evening.  10/24/19   [provider]  calcium carbonate (TUMS - DOSED IN MG ELEMENTAL CALCIUM) 500 MG chewable tablet Chew 2.5 tablets (500 mg of elemental calcium total) by mouth every 6 (six) hours as needed for indigestion. 06/25/20   Enzo Bi, MD  carvedilol (COREG) 6.25 MG tablet Take 6.25 mg by mouth 2 (two) times daily with a meal.    [provider]  cefTAZidime (FORTAZ) IVPB Inject 1 g into the vein every Monday, Wednesday, and Friday with hemodialysis. Indication: Thoracic vertebral osteomyelitis with disciits First Dose: Yes Last Day of Therapy:  10/10/20 Labs - Once weekly:  CBC/D, CMP, ESR, vancomycin trough   Method of administration may be changed at the discretion of home infusion pharmacist based upon assessment of the patient and/or caregiver's ability to self-administer the medication ordered. 08/17/20   Wyvonnia Dusky, MD  Cholecalciferol (VITAMIN  D3) 25 MCG (1000 UT) CAPS Take 1,000 Units by mouth daily.     [provider]  cholestyramine light (PREVALITE) 4 g packet Take 4 g by mouth 2 (two) times daily.    [provider]  CREON 12000 units CPEP capsule Take 36,000 Units by mouth 3 (three) times daily before meals.  10/21/19   [provider]  cyanocobalamin 1000 MCG tablet Take 1,000 mcg by mouth daily.     [provider]  diphenhydrAMINE (BENADRYL) 25 MG tablet Take 25 mg by mouth every 6 (six) hours as needed for itching.    [provider]  epoetin alfa (EPOGEN) 10000 UNIT/ML injection Inject 1 mL (10,000 Units total) into the vein every Monday, Wednesday, and Friday with hemodialysis. 06/27/20   Enzo Bi, MD  feeding supplement, ENSURE ENLIVE, (ENSURE ENLIVE) LIQD Take 237 mLs by mouth daily. 06/26/20   Enzo Bi, MD  Ferrous Sulfate (IRON) 325 (65 Fe) MG TABS Take 325 mg by mouth daily.     [provider]  furosemide (LASIX) 80 MG tablet Take 80 mg by mouth daily. 05/08/20   [provider]  gabapentin (NEURONTIN) 100 MG capsule Take 100 mg by mouth every evening.     [provider]  hydrALAZINE (APRESOLINE) 50 MG tablet Take 1 tablet (50 mg total) by mouth every 8 (eight) hours. 06/25/20   Enzo Bi, MD  HYDROcodone-acetaminophen (NORCO/VICODIN) 5-325 MG tablet Take 1 tablet by mouth 3 (three) times daily as needed for moderate pain or severe pain. 07/26/20   [provider]  hydrOXYzine (ATARAX/VISTARIL) 25 MG tablet Take 25 mg by mouth daily.     [provider]  multivitamin (RENA-VIT) TABS tablet Take 1 tablet by mouth daily.     [provider]  mupirocin ointment (BACTROBAN) 2 % Apply 1 application topically daily as needed (leg rash).  11/09/19   [provider]  nitroGLYCERIN (NITROSTAT) 0.4 MG SL tablet Place 0.4 mg under the tongue every 5 (five) minutes as needed for chest pain.     [provider]  Omega-3  300 MG CAPS Take 300 mg by mouth 2 (two) times daily. Every other day opposite omega XL    [provider]  pantoprazole (PROTONIX) 40 MG tablet Take 40 mg by mouth daily. 08/28/19   [provider]  polyethylene glycol (MIRALAX / GLYCOLAX) 17 g packet Take 17 g by mouth daily. 06/25/20   Enzo Bi, MD  Probiotic Product (PROBIOTIC PO) Take 1 capsule by mouth daily.    [provider]  promethazine (PHENERGAN) 25 MG tablet Take 1 tablet (25 mg total) by mouth every 6 (six) hours as needed for nausea or vomiting. 06/25/20   Enzo Bi, MD  sevelamer carbonate (RENVELA) 800 MG tablet Take 800 mg by mouth 4 (four) times daily. (at meals and with a snack)    [provider]  vancomycin IVPB Inject 1,000 mg into the vein every Monday, Wednesday, and Friday with hemodialysis. Indication:  Thoracic vertebral osteomyelitis with disciits First Dose: Yes Last Day of Therapy:  10/10/20 Labs - Weekly:  CBC/D, CMP, vancomycin trough, ESR Method of administration:Elastomeric Method of administration may be changed at the discretion of the patient and/or caregiver's ability to self-administer the medication ordered. 08/17/20   Wyvonnia Dusky, MD     Critical care time: 60 minutes.

## 2020-09-17 NOTE — ED Notes (Signed)
Pharmacy called to verify and send EPI

## 2020-09-17 NOTE — ED Notes (Signed)
Per MD recheck in 15-30mins

## 2020-09-17 NOTE — ED Notes (Signed)
Pt more alert and able to communicate. Pt alert to self but stating she doesn't understand what's going on. This RN explaining situation to pt.

## 2020-09-17 NOTE — Progress Notes (Signed)
Patient admitted from ED on continue epinephrine infusion at 6.5 mcg/min. Patient attached to bedside monitor. SB 40s, SBP 120s. Patient arouses to voice and is oriented x4. CBG 30, patient asymptomatic. D10 infusion started and patient given 2 grape juices. Recheck of BG 52. Additional 2 grape juice given. Recheck BG 51 at 1800. Due to concern of inaccurate POC BG values, random glucose ordered and obtained by phlebotomy. Patient denies feeling hypoglycemic. She is able to clearly communicate and carry on appropriate conversation. 2 additional juice given at this time.

## 2020-09-17 NOTE — ED Provider Notes (Signed)
[facility] Emergency Department Provider Note  ____________________________________________   Event Date/Time   First MD Initiated Contact with Patient 09/17/20 1103     (approximate)  I have reviewed the triage vital signs and the nursing notes.   HISTORY  Chief Complaint Altered Mental Status   HPI [name] is a 59 y.o. female with a past medical history of chronic peripheral vascular disease including carotid atherosclerosis and aortic atherosclerosis, anemia, arthritis, anxiety, COPD, ESRD on HD and depression who presents EMS from home for assessment of altered mental status.  Patient is unable provide any history and arrival secondary to altered mental status.  No history is immediately available on patient presentation.      Past Medical History:  Diagnosis Date  . Anemia   . Anginal pain ([place])   . Anxiety   . Arthritis   . Asthma   . Broken wrist   . Bronchitis   . chronic diastolic CHF 0/93/8182  . Chronic kidney disease   . COPD (chronic obstructive pulmonary disease) ([place])   . Coronary artery disease    a. cath 2013: stenting to RCA (report not available); b. cath 2014: LM nl, pLAD 40%, mLAD nl, ost LCx 40%, mid LCx nl, pRCA 30% @ site of prior stent, mRCA 50%  . Depression   . Diabetes mellitus ([place])   . Diabetes mellitus without complication ([place])   . Diabetic neuropathy ([place])   . dialysis 2006  . Diverticulosis   . Dizziness   . Dyspnea   . Elevated lipids   . Environmental and seasonal allergies   . ESRD (end stage renal disease) on dialysis ([place])    M-W-F  . Gastroparesis   . GERD (gastroesophageal reflux disease)   . Headache   . History of anemia due to chronic kidney disease   . History of hiatal hernia   . HOH (hard of hearing)   . Hx of pancreatitis 2015  . Hypertension   . Lower extremity edema   . Mitral regurgitation    a. echo 10/2013: EF 62%, noWMA, mildly dilated LA, mild to mod MR/TR, GR1DD  .  Myocardial infarction ([place])   . Orthopnea   . Parathyroid abnormality ([place])   . Peripheral arterial disease ([place])   . Pneumonia   . Renal cancer ([place])   . Renal insufficiency    Pt is on dialysis on M,W + F.  . Wheezing     Patient Active Problem List   Diagnosis Date Noted  . Bradycardia 09/17/2020  . Advanced care planning/counseling discussion   . Discitis of thoracic region   . Fluid overload 08/10/2020  . HLD (hyperlipidemia) 08/10/2020  . Chest pain 08/10/2020  . CAD (coronary artery disease) 08/10/2020  . GERD (gastroesophageal reflux disease) 08/10/2020  . Obesity (BMI 30-39.9) 06/06/2020  . Anemia in ESRD (end-stage renal disease) ([place]) 05/31/2020  . Type 1 diabetes mellitus ([place]) 05/31/2020  . End-stage renal disease ([place]) 05/31/2020  . Hypertensive disorder 05/31/2020  . Arm pain 05/21/2020  . Cellulitis of right leg 05/02/2020  . ESRD (end stage renal disease) ([place]) 04/13/2020  . SOBOE (shortness of breath on exertion) 02/08/2020  . Hematoma of arm, left, subsequent encounter 11/24/2019  . Respiratory failure ([place])   . Shortness of breath   . Complication of arteriovenous dialysis fistula 11/13/2019  . PVD (peripheral vascular disease) ([place]) 11/13/2019  . Severe anemia 11/13/2019  . Pressure injury of skin 11/13/2019  . ESRD on  hemodialysis ([place])   . Hemorrhagic shock ([place]) 11/11/2019  . Leg pain 10/02/2019  . CAP (community acquired pneumonia) 10/01/2019  . Atherosclerosis of native arteries of extremity with intermittent claudication ([place]) 05/01/2019  . Right-sided headache   . COPD (chronic obstructive pulmonary disease) ([place]) 10/06/2018  . Syncope 10/04/2018  . Symptomatic anemia 06/18/2018  . Gastroparesis due to DM ([place]) 01/18/2018  . Complication of vascular access for dialysis 12/04/2017  . Osteomyelitis ([place]) 09/30/2017  . Carotid stenosis 06/18/2017  . Bilateral carotid artery stenosis 06/16/2017  . Benign essential HTN 05/19/2017  . CKD  (chronic kidney disease) stage 5, GFR less than 15 ml/min (HCC) 05/19/2017  . Hyperlipidemia, mixed 05/19/2017  . Shortness of breath 05/04/2017  . Rash, skin 12/16/2016  . Cellulitis of lower extremity 07/29/2016  . Chronic venous insufficiency 07/29/2016  . Lymphedema 07/29/2016  . TIA (transient ischemic attack) 04/21/2016  . Altered mental status 04/08/2016  . Hyperammonemia ([place]) 04/08/2016  . Elevated troponin 04/08/2016  . Depression 04/08/2016  . Depression, major, recurrent, severe with psychosis ([place]) 04/08/2016  . Blood in stool   . Intractable cyclical vomiting with nausea   . Reflux esophagitis   . Gastritis   . Generalized abdominal pain   . Uncontrollable vomiting   . Major depressive disorder, recurrent episode, moderate ([place]) 03/15/2016  . Adjustment disorder with mixed anxiety and depressed mood 03/15/2016  . Malnutrition of moderate degree 12/01/2015  . Renal mass   . Dyspnea   . Acute renal failure ([place])   . Respiratory failure ([place])   . High temperature 11/14/2015  . Encounter for central line placement   . Encounter for orogastric (OG) tube placement   . Nausea 11/12/2015  . Hyperkalemia 10/03/2015  . Diarrhea, unspecified 07/22/2015  . Pneumonia 05/21/2015  . Hypoglycemia 04/24/2015  . Unresponsiveness 04/24/2015  . Sinus bradycardia 04/24/2015  . Hypothermia 04/24/2015  . Acute respiratory failure ([place]) 04/24/2015  . Acute on chronic diastolic CHF (congestive heart failure) ([place]) 04/05/2015  . Diabetic gastroparesis ([place]) 04/05/2015  . Hypokalemia 04/05/2015  . Generalized weakness 04/05/2015  . Acute pulmonary edema ([place]) 04/03/2015  . Nausea and vomiting 04/03/2015  . Hypoglycemia associated with diabetes ([place]) 04/03/2015  . Anemia of chronic disease 04/03/2015  . Secondary hyperparathyroidism ([place]) 04/03/2015  . Pressure ulcer 04/02/2015  . Acute respiratory failure with hypoxia ([place]) 04/01/2015  . Adjustment disorder with anxiety 03/14/2015   . Somatic symptom disorder, mild 03/08/2015  . Coronary artery disease involving native coronary artery of native heart without angina pectoris   . Nausea & vomiting 03/06/2015  . Abdominal pain 03/06/2015  . Type 2 diabetes mellitus with hypoglycemia without coma, without long-term current use of insulin ([place]) 03/06/2015  . HTN (hypertension) 03/06/2015  . Gastroparesis 02/24/2015  . Pleural effusion 02/19/2015  . Bacteremia due to Enterococcus 02/19/2015  . End-stage renal disease on hemodialysis ([place]) 02/19/2015  . Diarrhea 08/01/2014    Past Surgical History:  Procedure Laterality Date  . A/V SHUNTOGRAM Left 01/20/2018   Procedure: A/V SHUNTOGRAM;  Surgeon: Algernon Huxley, MD;  Location: [place] [facility];  Service: Cardiovascular;  Laterality: Left;  . ABDOMINAL HYSTERECTOMY  1992  . AMPUTATION TOE Left 10/02/2017   Procedure: AMPUTATION TOE-LEFT GREAT TOE;  Surgeon: Albertine Patricia, DPM;  Location: [facility] ORS;  Service: Podiatry;  Laterality: Left;  . APPENDECTOMY    . APPLICATION OF WOUND VAC N/A 11/25/2019   Procedure: APPLICATION OF WOUND VAC;  Surgeon: Katha Cabal, MD;  Location:  [facility] ORS;  Service: Vascular;  Laterality: N/A;  . ARTERY BIOPSY Right 10/11/2018   Procedure: BIOPSY TEMPORAL ARTERY;  Surgeon: Vickie Epley, MD;  Location: [facility] ORS;  Service: General;  Laterality: Right;  . CARDIAC CATHETERIZATION Left 07/26/2015   Procedure: Left Heart Cath and Coronary Angiography;  Surgeon: Dionisio David, MD;  Location: [place] [facility];  Service: Cardiovascular;  Laterality: Left;  . CATARACT EXTRACTION W/ INTRAOCULAR LENS IMPLANT Right   . CATARACT EXTRACTION W/PHACO Left 03/10/2017   Procedure: CATARACT EXTRACTION PHACO AND INTRAOCULAR LENS PLACEMENT (IOC);  Surgeon: Birder Robson, MD;  Location: [facility] ORS;  Service: Ophthalmology;  Laterality: Left;  [place] 00:51.9 AP% 14.2 CDE 7.39 fluid pack lot # [id number]  . CENTRAL LINE INSERTION Right 11/11/2019    Procedure: CENTRAL LINE INSERTION;  Surgeon: Katha Cabal, MD;  Location: [facility] ORS;  Service: Vascular;  Laterality: Right;  . CENTRAL LINE INSERTION  11/25/2019   Procedure: CENTRAL LINE INSERTION;  Surgeon: Katha Cabal, MD;  Location: [facility] ORS;  Service: Vascular;;  . CHOLECYSTECTOMY    . COLONOSCOPY WITH PROPOFOL N/A 08/12/2016   Procedure: COLONOSCOPY WITH PROPOFOL;  Surgeon: Lollie Sails, MD;  Location[facility] ENDOSCOPY;  Service: Endoscopy;  Laterality: N/A;  . DIALYSIS FISTULA CREATION Left    upper arm  . dialysis grafts    . DIALYSIS/PERMA CATHETER INSERTION N/A 11/14/2019   Procedure: DIALYSIS/PERMA CATHETER INSERTION;  Surgeon: Algernon Huxley, MD;  Location: [place] [facility];  Service: Cardiovascular;  Laterality: N/A;  . DIALYSIS/PERMA CATHETER INSERTION N/A 02/03/2020   Procedure: DIALYSIS/PERMA CATHETER INSERTION;  Surgeon: Katha Cabal, MD;  Location: [place] [facility] LAB;  Service: Cardiovascular;  Laterality: N/A;  . DIALYSIS/PERMA CATHETER INSERTION N/A 06/19/2020   Procedure: DIALYSIS/PERMA CATHETER INSERTION;  Surgeon: Katha Cabal, MD;  Location: [place] [facility] LAB;  Service: Cardiovascular;  Laterality: N/A;  . DIALYSIS/PERMA CATHETER REMOVAL N/A 05/25/2020   Procedure: DIALYSIS/PERMA CATHETER REMOVAL;  Surgeon: Katha Cabal, MD;  Location: [place] [facility] LAB;  Service: Cardiovascular;  Laterality: N/A;  . ESOPHAGOGASTRODUODENOSCOPY N/A 03/08/2015   Procedure: ESOPHAGOGASTRODUODENOSCOPY (EGD);  Surgeon: Manya Silvas, MD;  Location[facility] ENDOSCOPY;  Service: Endoscopy;  Laterality: N/A;  . ESOPHAGOGASTRODUODENOSCOPY (EGD) WITH PROPOFOL N/A 03/18/2016   Procedure: ESOPHAGOGASTRODUODENOSCOPY (EGD) WITH PROPOFOL;  Surgeon: [name], MD;  Location: [facility] ENDOSCOPY;  Service: Endoscopy;  Laterality: N/A;  . EYE SURGERY Right 2018  . FECAL TRANSPLANT N/A 08/23/2015   Procedure: FECAL TRANSPLANT;  Surgeon: Manya Silvas, MD;  Location: [facility]  ENDOSCOPY;  Service: Endoscopy;  Laterality: N/A;  . HAND SURGERY Bilateral   . HEMATOMA EVACUATION Left 11/25/2019   Procedure: EVACUATION HEMATOMA;  Surgeon: Katha Cabal, MD;  Location: [facility] ORS;  Service: Vascular;  Laterality: Left;  . I & D EXTREMITY Left 11/25/2019   Procedure: IRRIGATION AND DEBRIDEMENT EXTREMITY;  Surgeon: Katha Cabal, MD;  Location: [facility] OR[facility];  Service: Vascular;  Laterality: Left;  . IR FLUORO GUIDE CV LINE RIGHT  04/06/2020  . IR INJECT/THERA/INC NEEDLE/CATH/PLC EPI/CERV/THOR [facility] [place]  08/13/2020  . IR RADIOLOGIST EVAL & MGMT  07/28/2019  . IR RADIOLOGIST EVAL & MGMT  08/11/2019  . LIGATION OF ARTERIOVENOUS  FISTULA Left 11/11/2019  . LIGATION OF ARTERIOVENOUS  FISTULA Left 11/11/2019   Procedure: LIGATION OF ARTERIOVENOUS  FISTULA;  Surgeon: Katha Cabal, MD;  Location: [facility] ORS;  Service: Vascular;  Laterality: Left;  . LIGATIONS OF HERO GRAFT Right 06/13/2020   Procedure: LIGATION /  REMOVAL OF RIGHT HERO GRAFT;  Surgeon: Katha Cabal, MD;  Location: [facility] ORS;  Service: Vascular;  Laterality: Right;  . PERIPHERAL VASCULAR CATHETERIZATION N/A 12/20/2015   Procedure: Thrombectomy of dialysis access versus permcath placement;  Surgeon: Algernon Huxley, MD;  Location: [place] [facility] LAB;  Service: Cardiovascular;  Laterality: N/A;  . PERIPHERAL VASCULAR CATHETERIZATION N/A 12/20/2015   Procedure: A/V Shunt Intervention;  Surgeon: Algernon Huxley, MD;  Location: [place] CV [facility];  Service: Cardiovascular;  Laterality: N/A;  . PERIPHERAL VASCULAR CATHETERIZATION N/A 12/20/2015   Procedure: A/V Shuntogram/Fistulagram;  Surgeon: Algernon Huxley, MD;  Location: [place] CV LAB;  Service: Cardiovascular;  Laterality: N/A;  . PERIPHERAL VASCULAR CATHETERIZATION N/A 01/02/2016   Procedure: A/V Shuntogram/Fistulagram;  Surgeon: Algernon Huxley, MD;  Location: [place] [facility];  Service: Cardiovascular;  Laterality: N/A;  . PERIPHERAL VASCULAR CATHETERIZATION N/A  01/02/2016   Procedure: A/V Shunt Intervention;  Surgeon: Algernon Huxley, MD;  Location: [place] [facility];  Service: Cardiovascular;  Laterality: N/A;  . TEE WITHOUT CARDIOVERSION N/A 06/11/2020   Procedure: TRANSESOPHAGEAL ECHOCARDIOGRAM (TEE);  Surgeon: Teodoro Spray, MD;  Location: [facility] ORS;  Service: Cardiovascular;  Laterality: N/A;  . UPPER EXTREMITY VENOGRAPHY Right 01/18/2020   Procedure: UPPER EXTREMITY VENOGRAPHY;  Surgeon: Katha Cabal, MD;  Location: [place] [facility];  Service: Cardiovascular;  Laterality: Right;  Marland Kitchen VASCULAR ACCESS DEVICE INSERTION Right 04/13/2020   Procedure: INSERTION OF HERO VASCULAR ACCESS DEVICE (GRAFT);  Surgeon: Katha Cabal, MD;  Location: [facility] OR[facility];  Service: Vascular;  Laterality: Right;    Prior to Admission medications   Medication Sig Start Date End Date Taking? Authorizing Provider  albuterol (PROVENTIL) (2.5 MG/3ML) 0.083% nebulizer solution Take 2.5 mg by nebulization every 4 (four) hours as needed for wheezing or shortness of breath.    [provider]  albuterol (VENTOLIN HFA) 108 (90 Base) MCG/ACT inhaler Inhale 2 puffs into the lungs every 4 (four) hours as needed for wheezing or shortness of breath. 11/09/19   [provider]  alum & mag hydroxide-simeth (MAALOX/MYLANTA) 200-200-20 MG/5ML suspension Take 15 mLs by mouth every 6 (six) hours as needed for indigestion or heartburn. 06/25/20   Enzo Bi, MD  amLODipine (NORVASC) 10 MG tablet Take 1 tablet (10 mg total) by mouth daily. 06/25/20   Enzo Bi, MD  ammonium lactate (LAC-HYDRIN) 12 % lotion Apply 1 application topically 2 (two) times daily as needed for dry skin.     [provider]  Ascorbic Acid (VITA-C PO) Take 1 tablet by mouth daily.    [provider]  aspirin EC 81 MG tablet Take 81 mg by mouth daily.    [provider]  atorvastatin (LIPITOR) 20 MG tablet Take 20 mg by mouth every evening.  10/24/19   [provider]   calcium carbonate (TUMS - DOSED IN MG ELEMENTAL CALCIUM) 500 MG chewable tablet Chew 2.5 tablets (500 mg of elemental calcium total) by mouth every 6 (six) hours as needed for indigestion. 06/25/20   Enzo Bi, MD  carvedilol (COREG) 6.25 MG tablet Take 6.25 mg by mouth 2 (two) times daily with a meal.    [provider]  cefTAZidime (FORTAZ) IVPB Inject 1 g into the vein every Monday, Wednesday, and Friday with hemodialysis. Indication: Thoracic vertebral osteomyelitis with disciits First Dose: Yes Last Day of Therapy:  10/10/20 Labs - Once weekly:  CBC/D, CMP, ESR, vancomycin trough   Method of administration may  be changed at the discretion of home infusion pharmacist based upon assessment of the patient and/or caregiver's ability to self-administer the medication ordered. 08/17/20   Wyvonnia Dusky, MD  Cholecalciferol (VITAMIN D3) 25 MCG (1000 UT) CAPS Take 1,000 Units by mouth daily.     [provider]  cholestyramine light (PREVALITE) 4 g packet Take 4 g by mouth 2 (two) times daily.    [provider]  CREON 12000 units CPEP capsule Take 36,000 Units by mouth 3 (three) times daily before meals.  10/21/19   [provider]  cyanocobalamin 1000 MCG tablet Take 1,000 mcg by mouth daily.     [provider]  diphenhydrAMINE (BENADRYL) 25 MG tablet Take 25 mg by mouth every 6 (six) hours as needed for itching.    [provider]  epoetin alfa (EPOGEN) 10000 UNIT/ML injection Inject 1 mL (10,000 Units total) into the vein every Monday, Wednesday, and Friday with hemodialysis. 06/27/20   Enzo Bi, MD  feeding supplement, ENSURE ENLIVE, (ENSURE ENLIVE) LIQD Take 237 mLs by mouth daily. 06/26/20   Enzo Bi, MD  Ferrous Sulfate (IRON) 325 (65 Fe) MG TABS Take 325 mg by mouth daily.     [provider]  furosemide (LASIX) 80 MG tablet Take 80 mg by mouth daily. 05/08/20   [provider]  gabapentin (NEURONTIN) 100 MG capsule  Take 100 mg by mouth every evening.     [provider]  hydrALAZINE (APRESOLINE) 50 MG tablet Take 1 tablet (50 mg total) by mouth every 8 (eight) hours. 06/25/20   Enzo Bi, MD  HYDROcodone-acetaminophen (NORCO/VICODIN) 5-325 MG tablet Take 1 tablet by mouth 3 (three) times daily as needed for moderate pain or severe pain. 07/26/20   [provider]  hydrOXYzine (ATARAX/VISTARIL) 25 MG tablet Take 25 mg by mouth daily.     [provider]  multivitamin (RENA-VIT) TABS tablet Take 1 tablet by mouth daily.     [provider]  mupirocin ointment (BACTROBAN) 2 % Apply 1 application topically daily as needed (leg rash).  11/09/19   [provider]  nitroGLYCERIN (NITROSTAT) 0.4 MG SL tablet Place 0.4 mg under the tongue every 5 (five) minutes as needed for chest pain.     [provider]  Omega-3 300 MG CAPS Take 300 mg by mouth 2 (two) times daily. Every other day opposite omega XL    [provider]  pantoprazole (PROTONIX) 40 MG tablet Take 40 mg by mouth daily. 08/28/19   [provider]  polyethylene glycol (MIRALAX / GLYCOLAX) 17 g packet Take 17 g by mouth daily. 06/25/20   Enzo Bi, MD  Probiotic Product (PROBIOTIC PO) Take 1 capsule by mouth daily.    [provider]  promethazine (PHENERGAN) 25 MG tablet Take 1 tablet (25 mg total) by mouth every 6 (six) hours as needed for nausea or vomiting. 06/25/20   Enzo Bi, MD  sevelamer carbonate (RENVELA) 800 MG tablet Take 800 mg by mouth 4 (four) times daily. (at meals and with a snack)    [provider]  vancomycin IVPB Inject 1,000 mg into the vein every Monday, Wednesday, and Friday with hemodialysis. Indication:  Thoracic vertebral osteomyelitis with disciits First Dose: Yes Last Day of Therapy:  10/10/20 Labs - Weekly:  CBC/D, CMP, vancomycin trough, ESR Method of administration:Elastomeric Method of administration may be changed at the discretion of the  patient and/or caregiver's ability to self-administer the medication ordered. 08/17/20  Wyvonnia Dusky, MD    Allergies Ace inhibitors, Ativan [lorazepam], Compazine [prochlorperazine edisylate], Sumatriptan succinate, Zofran [ondansetron], Losartan, Prochlorperazine, Reglan [metoclopramide], Scopolamine, Tape, and Tapentadol  Family History  Problem Relation Age of Onset  . Kidney disease Mother   . Diabetes Mother   . Cancer Father   . Kidney disease Sister     Social History Social History   Tobacco Use  . Smoking status: Never Smoker  . Smokeless tobacco: Never Used  Vaping Use  . Vaping Use: Never used  Substance Use Topics  . Alcohol use: Not Currently    Comment: glass wine week per pt  . Drug use: Yes    Types: Marijuana    Comment: once a day    Review of Systems  Review of Systems  Unable to perform ROS: Mental status change      ____________________________________________   PHYSICAL EXAM:  VITAL SIGNS: ED Triage Vitals  Enc Vitals Group     BP      Pulse      Resp      Temp      Temp src      SpO2      Weight      Height      Head Circumference      Peak Flow      Pain Score      Pain Loc      Pain Edu?      Excl. in [place]?    Vitals:   09/17/20 1845 09/17/20 1900  BP: (!) 125/57 116/60  Pulse: (!) 50 (!) 49  Resp: 14 12  Temp:    SpO2: 100% 100%   Physical Exam Vitals and nursing note reviewed.  Constitutional:      General: She is in acute distress.     Appearance: She is well-developed and well-nourished. She is ill-appearing.  HENT:     Head: Normocephalic and atraumatic.     Right Ear: External ear normal.     Left Ear: External ear normal.     Nose: Nose normal.  Eyes:     Conjunctiva/sclera: Conjunctivae normal.  Cardiovascular:     Rate and Rhythm: Regular rhythm. Bradycardia present.     Heart sounds: No murmur heard.   Pulmonary:     Effort: Pulmonary effort is normal. No respiratory distress.     Breath  sounds: Normal breath sounds.  Abdominal:     Palpations: Abdomen is soft.     Tenderness: There is no abdominal tenderness. There is no right CVA tenderness or left CVA tenderness.  Musculoskeletal:        General: No edema.     Cervical back: Neck supple.  Skin:    General: Skin is warm and dry.  Neurological:     Mental Status: She is lethargic.  Psychiatric:        Mood and Affect: Mood and affect normal.     On arrival patient is able to moan her name.  She does not otherwise participate in neuro exam.  Pupils equal and symmetric although pinpoint bilaterally.  She does withdrawal all extremities to noxious stimuli. ____________________________________________   LABS (all labs ordered are listed, but only abnormal results are displayed)  Labs Reviewed  CBC WITH DIFFERENTIAL/PLATELET - Abnormal; Notable for the following components:      Result Value   RBC 3.17 (*)    Hemoglobin 9.1 (*)    HCT 27.9 (*)    RDW 17.9 (*)  Platelets 144 (*)    Neutro Abs 1.0 (*)    Eosinophils Absolute 0.8 (*)    All other components within normal limits  COMPREHENSIVE METABOLIC PANEL - Abnormal; Notable for the following components:   Potassium 3.4 (*)    Glucose, Bld 24 (*)    Creatinine, Ser 4.84 (*)    Calcium 12.0 (*)    Total Protein 6.2 (*)    Albumin 2.6 (*)    AST 14 (*)    GFR, Estimated 10 (*)    All other components within normal limits  URINALYSIS, COMPLETE (UACMP) WITH MICROSCOPIC - Abnormal; Notable for the following components:   Color, Urine YELLOW (*)    APPearance CLOUDY (*)    Protein, ur >=300 (*)    Bacteria, UA RARE (*)    All other components within normal limits  BLOOD GAS, ARTERIAL - Abnormal; Notable for the following components:   pCO2 arterial 59 (*)    pO2, Arterial 158 (*)    Bicarbonate 32.6 (*)    Acid-Base Excess 5.8 (*)    All other components within normal limits  APTT - Abnormal; Notable for the following components:   aPTT 38 (*)    All  other components within normal limits  BRAIN NATRIURETIC PEPTIDE - Abnormal; Notable for the following components:   B Natriuretic Peptide 431.2 (*)    All other components within normal limits  LACTIC ACID, PLASMA - Abnormal; Notable for the following components:   Lactic Acid, Venous 2.1 (*)    All other components within normal limits  GLUCOSE, CAPILLARY - Abnormal; Notable for the following components:   Glucose-Capillary 30 (*)    All other components within normal limits  GLUCOSE, CAPILLARY - Abnormal; Notable for the following components:   Glucose-Capillary 52 (*)    All other components within normal limits  GLUCOSE, CAPILLARY - Abnormal; Notable for the following components:   Glucose-Capillary 51 (*)    All other components within normal limits  CBG MONITORING, ED - Abnormal; Notable for the following components:   Glucose-Capillary 26 (*)    All other components within normal limits  CBG MONITORING, ED - Abnormal; Notable for the following components:   Glucose-Capillary 69 (*)    All other components within normal limits  CBG MONITORING, ED - Abnormal; Notable for the following components:   Glucose-Capillary 66 (*)    All other components within normal limits  RESP PANEL BY RT-PCR (FLU A&B, COVID) ARPGX2  MRSA PCR SCREENING  URINE CULTURE  CULTURE, BLOOD (ROUTINE X 2)  CULTURE, BLOOD (ROUTINE X 2)  LACTIC ACID, PLASMA  AMMONIA  TSH  PROTIME-INR  URINE DRUG SCREEN, QUALITATIVE ([facility] ONLY)  MAGNESIUM  PHOSPHORUS  ETHANOL  GLUCOSE, RANDOM  GLUCOSE, CAPILLARY  URINALYSIS, COMPLETE (UACMP) WITH MICROSCOPIC  CBC  BASIC METABOLIC PANEL  MAGNESIUM  PHOSPHORUS  VANCOMYCIN, RANDOM  CBG MONITORING, ED  TROPONIN I (HIGH SENSITIVITY)  TROPONIN I (HIGH SENSITIVITY)   ____________________________________________  EKG  Sinus bradycardia with a ventricular rate of 38, normal axis, no other clear evidence of acute  ischemia. ____________________________________________  RADIOLOGY  ED MD interpretation: CT head unremarkable for evidence of intracranial hemorrhage.  Chest x-ray shows some small bilateral effusions and bilateral edema without pneumothorax or focal consolidation  Official radiology report(s): DG Chest 1 View  Result Date: 09/17/2020 CLINICAL DATA:  Altered mental status EXAM: CHEST  1 VIEW COMPARISON:  08/10/2020 FINDINGS: Mild cardiac enlargement. Diffuse bilateral airspace disease is symmetric and has progressed  in the interval. Small bilateral pleural effusions. Dual lumen central venous catheter tip in the right atrium unchanged. Left subclavian vascular stent unchanged. IMPRESSION: Progression of diffuse bilateral airspace disease most likely edema. Small bilateral effusions. Electronically Signed   By: Franchot Gallo M.D.   On: 09/17/2020 11:36   CT Head Wo Contrast  Result Date: 09/17/2020 CLINICAL DATA:  Altered mental status. Found unresponsive. EXAM: CT HEAD WITHOUT CONTRAST TECHNIQUE: Contiguous axial images were obtained from the base of the skull through the vertex without intravenous contrast. COMPARISON:  Most recent head CT 06/11/2020 FINDINGS: Brain: No intracranial hemorrhage, mass effect, or midline shift. No hydrocephalus. The basilar cisterns are patent. No evidence of territorial infarct or acute ischemia. No extra-axial or intracranial fluid collection. Vascular: Atherosclerosis of skullbase vasculature without hyperdense vessel or abnormal calcification. Skull: No fracture or focal lesion. Sinuses/Orbits: Paranasal sinuses and mastoid air cells are clear. The visualized orbits are unremarkable. Bilateral cataract resection. Other: None. IMPRESSION: No acute intracranial abnormality. Electronically Signed   By: Keith Rake M.D.   On: 09/17/2020 15:51    ____________________________________________   PROCEDURES  Procedure(s) performed (including Critical  Care):  .Critical Care Performed by: Lucrezia Starch, MD Authorized by: Lucrezia Starch, MD   Critical care provider statement:    Critical care time (minutes):  75   Critical care time was exclusive of:  Separately billable procedures and treating other patients   Critical care was necessary to treat or prevent imminent or life-threatening deterioration of the following conditions:  Metabolic crisis, circulatory failure and CNS failure or compromise   Critical care was time spent personally by me on the following activities:  Discussions with consultants, evaluation of patient's response to treatment, examination of patient, ordering and performing treatments and interventions, ordering and review of laboratory studies, ordering and review of radiographic studies, pulse oximetry, re-evaluation of patient's condition, obtaining history from patient or surrogate and review of old charts     ____________________________________________   INITIAL IMPRESSION / [place] / ED COURSE      Patient presents with post history exam for assessment of altered mental status.  On arrival she is very lethargic and bradycardic with otherwise stable blood pressure and SPO2 on room air.  She is notably hypothermic at 90.5 and she was made a placed on [facility].  Given history of ESRD she was treated empirically for hyperkalemia with calcium throughout IOS with some difficulty obtaining IV access initially.  Differential includes but is not limited to [facility], sepsis, metabolic derangements, toxic ingestion, overdose, liver failure and symptomatic bradycardia secondary to ischemia.  CBC remarkable for no leukocytosis and hemoglobin at baseline.  CMP with hyperglycemia with a glucose of 24 and a K of 3.4 but no other significant derangements.  Creatinine consistent with known history of ESRD and calcium slightly elevated as expected after treatment for concern for hyperkalemia prior to obtaining  labs.  Patient was given D50 which improved her blood sugar to 84.  Troponin not elevated at 4 and low suspicion for ACS or myocarditis.  Lactic acid 0.7.  Covid is negative.  UA does not appear infected.  Ammonia WNL. BNP slightly elevated at 431 consistent with mild edema on x-ray.  UDS negative.  VBG remarkable for pH of 7.25 with a PCO2 of 59 and a bicarb of 32.6.  Given hypothermia and bradycardia and sepsis on differential blood cultures were obtained and patient was given broad-spectrum antibiotics.  Cardiology was consulted for recommendations regarding  concern for symptomatic bradycardia and agreed with Levophed.  Also reached out to nephrology to make them aware was admitted patient out of the ICU.  Patient admitted to medical ICU in critical addition for further evaluation management of concern for symptomatic bradycardia in the setting of hypothermia and recurrent hypoglycemia she was noted to require multiple doses of D50 to maintain her glucose.     ____________________________________________   FINAL CLINICAL IMPRESSION(S) / ED DIAGNOSES  Final diagnoses:  Symptomatic bradycardia  Hypothermia, initial encounter  Hypoglycemia  Altered mental status, unspecified altered mental status type    Medications  sodium chloride flush (NS) 0.9 % injection 3 mL ( Intravenous Canceled Entry 09/17/20 1228)  sodium chloride flush (NS) 0.9 % injection 3 mL (has no administration in time range)  0.9 %  sodium chloride infusion (has no administration in time range)  norepinephrine (LEVOPHED) 72m in 2539mpremix infusion (0 mcg/min Intravenous Stopped 09/17/20 1357)  EPINEPHrine (ADRENALIN) 4 mg in NS 250 mL (0.016 mg/mL) premix infusion (7 mcg/min Intravenous Infusion Verify 09/17/20 1830)  docusate sodium (COLACE) capsule 100 mg (has no administration in time range)  polyethylene glycol (MIRALAX / GLYCOLAX) packet 17 g (has no administration in time range)  heparin injection 5,000 Units  (has no administration in time range)  dextrose 10 % infusion ( Intravenous Infusion Verify 09/17/20 1830)  ceFEPIme (MAXIPIME) 2 g in sodium chloride 0.9 % 100 mL IVPB (has no administration in time range)  vancomycin variable dose per unstable renal function (pharmacist dosing) (has no administration in time range)  calcium chloride injection 3 g (3 g Intravenous Given 09/17/20 1111)  atropine 1 MG/10ML injection 0.5 mg (0.5 mg Intravenous Given 09/17/20 1135)  insulin aspart (novoLOG) injection 5 Units (5 Units Intravenous Given 09/17/20 1214)    And  dextrose 50 % solution 50 mL (50 mLs Intravenous Given 09/17/20 1215)  piperacillin-tazobactam (ZOSYN) IVPB 3.375 g (0 g Intravenous Stopped 09/17/20 1325)  atropine 1 MG/10ML injection 0.5 mg (0.5 mg Intravenous Given 09/17/20 1217)  calcium gluconate inj 10% (1 g) URGENT USE ONLY! (1 g Intravenous Given 09/17/20 1218)  dextrose 50 % solution 50 mL (50 mLs Intravenous Given 09/17/20 1326)  glucagon (human recombinant) (GLUCAGEN) injection 1 mg (1 mg Intravenous Given 09/17/20 1412)     ED Discharge Orders    None       Note:  This document was prepared using Dragon voice recognition software and may include unintentional dictation errors.   SmLucrezia StarchMD 09/17/20 [phone]

## 2020-09-17 NOTE — Progress Notes (Signed)
CODE SEPSIS - PHARMACY COMMUNICATION  **Broad Spectrum Antibiotics should be administered within 1 hour of Sepsis diagnosis**  Time Code Sepsis Called/Page Received: @ 1216  Antibiotics Ordered: Zosyn   Time of 1st antibiotic administration: @ [place], PharmD, BCPS Clinical Pharmacist 09/17/2020 1:04 PM

## 2020-09-17 NOTE — ED Notes (Signed)
Md made aware of blood sugar of 66

## 2020-09-17 NOTE — ED Notes (Signed)
MD stating to initiate levophed at 2.75mcg. Pt BP Normotensive and MAP >65 but remains bradycardic.

## 2020-09-18 DIAGNOSIS — R001 Bradycardia, unspecified: Secondary | ICD-10-CM

## 2020-09-18 LAB — GLUCOSE, CAPILLARY
Glucose-Capillary: 227 mg/dL — ABNORMAL HIGH (ref 70–99)
Glucose-Capillary: 220 mg/dL — ABNORMAL HIGH (ref 70–99)
Glucose-Capillary: 150 mg/dL — ABNORMAL HIGH (ref 70–99)
Glucose-Capillary: 127 mg/dL — ABNORMAL HIGH (ref 70–99)
Glucose-Capillary: 76 mg/dL (ref 70–99)
Glucose-Capillary: 82 mg/dL (ref 70–99)
Glucose-Capillary: 81 mg/dL (ref 70–99)

## 2020-09-18 LAB — BASIC METABOLIC PANEL
Anion gap: 9 (ref 5–15)
BUN: 19 mg/dL (ref 8–23)
CO2: 27 mmol/L (ref 22–32)
Calcium: 8.8 mg/dL — ABNORMAL LOW (ref 8.9–10.3)
Chloride: 97 mmol/L — ABNORMAL LOW (ref 98–111)
Creatinine, Ser: 5.6 mg/dL — ABNORMAL HIGH (ref 0.44–1.00)
GFR, Estimated: 8 mL/min — ABNORMAL LOW (ref >60)
Glucose, Bld: 87 mg/dL (ref 70–99)
Potassium: 4.3 mmol/L (ref 3.5–5.1)
Sodium: 133 mmol/L — ABNORMAL LOW (ref 135–145)

## 2020-09-18 LAB — RENAL FUNCTION PANEL
Albumin: 2.4 g/dL — ABNORMAL LOW (ref 3.5–5.0)
Anion gap: 10 (ref 5–15)
BUN: 19 mg/dL (ref 8–23)
CO2: 27 mmol/L (ref 22–32)
Calcium: 8.9 mg/dL (ref 8.9–10.3)
Chloride: 97 mmol/L — ABNORMAL LOW (ref 98–111)
Creatinine, Ser: 5.56 mg/dL — ABNORMAL HIGH (ref 0.44–1.00)
GFR, Estimated: 8 mL/min — ABNORMAL LOW (ref >60)
Glucose, Bld: 86 mg/dL (ref 70–99)
Phosphorus: 4 mg/dL (ref 2.5–4.6)
Potassium: 4.3 mmol/L (ref 3.5–5.1)
Sodium: 134 mmol/L — ABNORMAL LOW (ref 135–145)

## 2020-09-18 LAB — CBC
HCT: 25.5 % — ABNORMAL LOW (ref 36.0–46.0)
Hemoglobin: 8.3 g/dL — ABNORMAL LOW (ref 12.0–15.0)
MCH: 28.1 pg (ref 26.0–34.0)
MCHC: 32.5 g/dL (ref 30.0–36.0)
MCV: 86.4 fL (ref 80.0–100.0)
Platelets: 102 10*3/uL — ABNORMAL LOW (ref 150–400)
RBC: 2.95 MIL/uL — ABNORMAL LOW (ref 3.87–5.11)
RDW: 17.3 % — ABNORMAL HIGH (ref 11.5–15.5)
WBC: 5.4 10*3/uL (ref 4.0–10.5)
nRBC: 0 % (ref 0.0–0.2)

## 2020-09-18 LAB — BRAIN NATRIURETIC PEPTIDE: B Natriuretic Peptide: 742.3 pg/mL — ABNORMAL HIGH (ref 0.0–100.0)

## 2020-09-18 LAB — HEMOGLOBIN A1C
Hgb A1c MFr Bld: 4.1 % — ABNORMAL LOW (ref 4.8–5.6)
Mean Plasma Glucose: 70.97 mg/dL

## 2020-09-18 LAB — PHOSPHORUS: Phosphorus: 3.9 mg/dL (ref 2.5–4.6)

## 2020-09-18 LAB — MAGNESIUM: Magnesium: 2.2 mg/dL (ref 1.7–2.4)

## 2020-09-18 MED ORDER — HEPARIN SODIUM (PORCINE) 1000 UNIT/ML DIALYSIS
1000.0000 [IU] | INTRAMUSCULAR | Status: DC | PRN
  Filled 2020-09-18: qty 6

## 2020-09-18 MED ORDER — PRISMASOL BGK 4/2.5 32-4-2.5 MEQ/L REPLACEMENT SOLN: Status: DC

## 2020-09-18 MED ORDER — CHLORHEXIDINE GLUCONATE CLOTH 2 % EX PADS
6.0000 | MEDICATED_PAD | Freq: Every day | CUTANEOUS | Status: DC
  Administered 2020-09-18 – 2020-09-20 (×2): 6 via TOPICAL

## 2020-09-18 MED ORDER — SODIUM CHLORIDE 0.9 % IV SOLN: 100.0000 mL | INTRAVENOUS | Status: DC | PRN

## 2020-09-18 MED ORDER — PENTAFLUOROPROP-TETRAFLUOROETH EX AERO
1.0000 "application " | INHALATION_SPRAY | CUTANEOUS | Status: DC | PRN
  Filled 2020-09-18: qty 30

## 2020-09-18 MED ORDER — LIDOCAINE-PRILOCAINE 2.5-2.5 % EX CREA
1.0000 "application " | TOPICAL_CREAM | CUTANEOUS | Status: DC | PRN
  Filled 2020-09-18: qty 5

## 2020-09-18 MED ORDER — PROMETHAZINE HCL 25 MG/ML IJ SOLN
6.2500 mg | Freq: Four times a day (QID) | INTRAMUSCULAR | Status: DC | PRN
  Administered 2020-09-18 – 2020-09-20 (×2): 6.25 mg via INTRAVENOUS
  Filled 2020-09-18 (×3): qty 1

## 2020-09-18 MED ORDER — VANCOMYCIN HCL IN DEXTROSE 1-5 GM/200ML-% IV SOLN
1000.0000 mg | INTRAVENOUS | Status: DC
  Filled 2020-09-18: qty 200

## 2020-09-18 MED ORDER — ALTEPLASE 2 MG IJ SOLR
2.0000 mg | Freq: Once | INTRAMUSCULAR | Status: DC | PRN
  Filled 2020-09-18: qty 2

## 2020-09-18 MED ORDER — MORPHINE SULFATE (PF) 2 MG/ML IV SOLN
1.0000 mg | INTRAVENOUS | Status: DC | PRN
  Administered 2020-09-18 (×2): 1 mg via INTRAVENOUS
  Filled 2020-09-18 (×2): qty 1

## 2020-09-18 MED ORDER — CHLORHEXIDINE GLUCONATE CLOTH 2 % EX PADS
6.0000 | MEDICATED_PAD | Freq: Every day | CUTANEOUS | Status: DC
  Administered 2020-09-18: 6 via TOPICAL

## 2020-09-18 MED ORDER — VANCOMYCIN HCL 500 MG/100ML IV SOLN
500.0000 mg | INTRAVENOUS | Status: AC
  Administered 2020-09-18: 500 mg via INTRAVENOUS
  Filled 2020-09-18: qty 100

## 2020-09-18 MED ORDER — HEPARIN SODIUM (PORCINE) 1000 UNIT/ML DIALYSIS
1000.0000 [IU] | INTRAMUSCULAR | Status: DC | PRN
  Filled 2020-09-18: qty 1

## 2020-09-18 MED ORDER — RENA-VITE PO TABS
1.0000 | ORAL_TABLET | Freq: Every day | ORAL | Status: DC
  Administered 2020-09-20 (×2): 1 via ORAL
  Filled 2020-09-18 (×6): qty 1

## 2020-09-18 MED ORDER — HYDROCODONE-ACETAMINOPHEN 5-325 MG PO TABS
1.0000 | ORAL_TABLET | Freq: Four times a day (QID) | ORAL | Status: DC | PRN
  Administered 2020-09-18: 1 via ORAL
  Filled 2020-09-18: qty 1

## 2020-09-18 MED ORDER — SODIUM CHLORIDE 0.9 % IV SOLN
1.0000 g | INTRAVENOUS | Status: DC
  Filled 2020-09-18: qty 1

## 2020-09-18 MED ORDER — PRISMASOL BGK 4/2.5 32-4-2.5 MEQ/L EC SOLN: Status: DC

## 2020-09-18 MED ORDER — LIDOCAINE HCL (PF) 1 % IJ SOLN
5.0000 mL | INTRAMUSCULAR | Status: DC | PRN
  Filled 2020-09-18: qty 5

## 2020-09-18 MED ORDER — NEPRO/CARBSTEADY PO LIQD
237.0000 mL | Freq: Two times a day (BID) | ORAL | Status: DC
  Administered 2020-09-18 – 2020-09-19 (×2): 237 mL via ORAL

## 2020-09-18 NOTE — Progress Notes (Addendum)
Pharmacy Antibiotic Note  [name] is a 59 y.o. female admitted on 09/17/2020 with bradycardia and hypoglycemia. Patient is ESRD on HD on Monday, Wednesday, Friday schedule. Patient is being treated as outpatient for thoracic osteomyelitis with disciitis. Antimicrobial regimen includes vancomycin 1 g MWF with HD + ceftazidime 1 g MWF with HD. Therapy is scheduled to continue until 10/10/20. Patient missed HD 12/27. Dialysis 12/28 per nephrology and then resume MWF HD per outpatient regimen. Pharmacy has been consulted for vancomycin dosing. Patient has also been re-started on outpatient ceftazidime.   Plan: Ceftazidime 1 g MWF with HD  Based on vancomycin random level of 23 yesterday, will order supplemental dose of vancomycin 500 mg x 1 with HD today and then resume prior outpatient regimen of vancomycin 1 g MWF with HD  Height: 5\' 5"  (165.1 cm) Weight: 83 kg (182 lb 15.7 oz) IBW/kg (Calculated) : 57  Temp (24hrs), Avg:97.7 F (36.5 C), Min:95.3 F (35.2 C), Max:100.5 F (38.1 C)  Recent Labs  Lab 09/17/20 1124 09/17/20 1718 09/17/20 1731 09/18/20 1506  WBC 4.0  --   --  5.4  CREATININE 4.84*  --   --  5.56*  5.60*  LATICACIDVEN 0.7  --  2.1*  --   VANCORANDOM  --  23  --   --     Estimated Creatinine Clearance: 11.2 mL/min (A) (by C-G formula based on SCr of 5.6 mg/dL (H)).    Allergies  Allergen Reactions  . Ace Inhibitors Swelling and Anaphylaxis  . Ativan [Lorazepam] Other (See Comments)    Reaction:Hallucinations and headaches  . Compazine [Prochlorperazine Edisylate] Anaphylaxis, Nausea And Vomiting and Other (See Comments)    Other reaction(s): dystonia from this vs. Reglan, 23 Jul - patient relates that she takes promethazine frequently with no problems  . Sumatriptan Succinate Other (See Comments)    Other reaction(s): delirium and hallucinations per [facility] records  . Zofran [Ondansetron] Nausea And Vomiting    Per pt. she is allergic to zofran or will experience  adverse reaction like hallucination   . Losartan Nausea Only  . Prochlorperazine Other (See Comments)    Reaction:  Unknown . Patient does not remember reaction but she does have vertigo and anxiety along with n and v at times. Could be used to treat any of these   . Reglan [Metoclopramide] Other (See Comments)    Per patient her Dr. Evelina Bucy her off it   . Scopolamine Other (See Comments)    Dizziness, also has vertigo already  . Tape Rash    Plastic tape causes rash  . Tapentadol Rash    Antimicrobials this admission: Zosyn 12/27 x 1 Cefepime 12/27 >> 12/28 Vancomycin 12/27 >>  Ceftazidime 12/29 >>  Dose adjustments this admission: N/A  Microbiology results: 12/27 UCx: E coli, susceptibilities pending 12/27 BCx: NGTD 12/27 MRSA PCR: (-)  Thank you for allowing pharmacy to be a part of this patient's care.  Benita Gutter 09/18/2020 4:41 PM

## 2020-09-18 NOTE — Progress Notes (Signed)
Patient Name: [name] Date of Encounter: 09/18/2020  Hospital Problem List     Active Problems:   Bradycardia    Patient Profile     59 y.o. female with history of end-stage renal disease on hemodialysis Monday Wednesday and Friday, diastolic heart failure, anemia, coronary disease, renal cell carcinoma, hypertension, COPD, depression who presented to the emergency room complaints of unresponsiveness.  She was noted to have sinus bradycardia.  She was brought to the emergency room where she was noted to be hypoglycemic with a glucose of 24, creatinine of 4.84.  Initial troponin was 4 and is remained at 4.  BNP was 620.  Urine drug screen was negative.  Arterial blood gas pH 7.35 PCO2 59 PO2 of 158.  EKG showed sinus bradycardia versus ventricular escape at the upper 30s and low 40s.  Head CT showed no acute abnormalities.  Chest x-ray showed bilateral effusions.  She was relatively hypotensive and was placed on low-dose Levophed and epi drip.  Rectal temp is currently 90 degrees.  She is currently on [facility].  Blood pressure is currently 111/56 with a pulse of 40.  She is on amlodipine 10 mg daily as an outpatient along with aspirin.  She is also on atorvastatin 20 mg daily.  She is also discharged with carvedilol 6.25 mg twice daily.  EKG at previous admission last month showed sinus bradycardia on the 60s.  She is arousable.  She has a history of coronary status post PCI in the past-last catheter 2013 showing noncritical disease.  Limited echo in September of this year showed preserved LV function.  TEE in September 2021 revealed normal ejection fraction with no vegetations.   Subjective   Much more alert today.  Glucose is improved, temp is improved.  Denies chest pain or shortness of breath.  Complains of right knee bleeding after biopsy.  Inpatient Medications    . Chlorhexidine Gluconate Cloth  6 each Topical Q0600  . Chlorhexidine Gluconate Cloth  6 each Topical Q0600  .  heparin  5,000 Units Subcutaneous Q8H  . sodium chloride flush  3 mL Intravenous Q12H  . vancomycin variable dose per unstable renal function (pharmacist dosing)   Does not apply See admin instructions    Vital Signs    Vitals:   09/18/20 0635 09/18/20 0640 09/18/20 0645 09/18/20 0650  BP:   118/61   Pulse: 65 65 64 64  Resp: 14 (!) 8 13 11   Temp:      TempSrc:      SpO2: 100% 100% 100% 100%  Weight:      Height:        Intake/Output Summary (Last 24 hours) at 09/18/2020 0726 Last data filed at 09/17/2020 1830 Gross per 24 hour  Intake 1081.31 ml  Output --  Net 1081.31 ml   Filed Weights   09/17/20 1117  Weight: 83 kg    Physical Exam    GEN: Well nourished, well developed, in no acute distress.  HEENT: normal.  Neck: Supple, no JVD, carotid bruits, or masses. Cardiac: RRR, no murmurs, rubs, or gallops. No clubbing, cyanosis, edema.  Radials/DP/PT 2+ and equal bilaterally.  Respiratory:  Respirations regular and unlabored, clear to auscultation bilaterally. GI: Soft, nontender, nondistended, BS + x 4. MS: no deformity or atrophy. Skin: Bleeding from right knee. Neuro:  Strength and sensation are intact. Psych: Normal affect.  Labs    CBC Recent Labs    09/17/20 1124  WBC 4.0  NEUTROABS  1.0*  HGB 9.1*  HCT 27.9*  MCV 88.0  PLT 892*   Basic Metabolic Panel Recent Labs    09/17/20 1124 09/17/20 1345 09/17/20 1811  NA 140  --   --   K 3.4*  --   --   CL 101  --   --   CO2 31  --   --   GLUCOSE 24*  --  76  BUN 15  --   --   CREATININE 4.84*  --   --   CALCIUM 12.0*  --   --   MG  --  2.3  --   PHOS  --  3.7  --    Liver Function Tests Recent Labs    09/17/20 1124  AST 14*  ALT 12  ALKPHOS 62  BILITOT 0.5  PROT 6.2*  ALBUMIN 2.6*   No results for input(s): LIPASE, AMYLASE in the last 72 hours. Cardiac Enzymes No results for input(s): CKTOTAL, CKMB, CKMBINDEX, TROPONINI in the last 72 hours. BNP Recent Labs    09/17/20 1124  BNP  431.2*   D-Dimer No results for input(s): DDIMER in the last 72 hours. Hemoglobin A1C No results for input(s): HGBA1C in the last 72 hours. Fasting Lipid Panel No results for input(s): CHOL, HDL, LDLCALC, TRIG, CHOLHDL, LDLDIRECT in the last 72 hours. Thyroid Function Tests Recent Labs    09/17/20 1124  TSH 1.437    Telemetry    Sinus rhythm at 63  ECG    Sinus bradycardia  Radiology    DG Chest 1 View  Result Date: 09/17/2020 CLINICAL DATA:  Altered mental status EXAM: CHEST  1 VIEW COMPARISON:  08/10/2020 FINDINGS: Mild cardiac enlargement. Diffuse bilateral airspace disease is symmetric and has progressed in the interval. Small bilateral pleural effusions. Dual lumen central venous catheter tip in the right atrium unchanged. Left subclavian vascular stent unchanged. IMPRESSION: Progression of diffuse bilateral airspace disease most likely edema. Small bilateral effusions. Electronically Signed   By: Franchot Gallo M.D.   On: 09/17/2020 11:36   CT Head Wo Contrast  Result Date: 09/17/2020 CLINICAL DATA:  Altered mental status. Found unresponsive. EXAM: CT HEAD WITHOUT CONTRAST TECHNIQUE: Contiguous axial images were obtained from the base of the skull through the vertex without intravenous contrast. COMPARISON:  Most recent head CT 06/11/2020 FINDINGS: Brain: No intracranial hemorrhage, mass effect, or midline shift. No hydrocephalus. The basilar cisterns are patent. No evidence of territorial infarct or acute ischemia. No extra-axial or intracranial fluid collection. Vascular: Atherosclerosis of skullbase vasculature without hyperdense vessel or abnormal calcification. Skull: No fracture or focal lesion. Sinuses/Orbits: Paranasal sinuses and mastoid air cells are clear. The visualized orbits are unremarkable. Bilateral cataract resection. Other: None. IMPRESSION: No acute intracranial abnormality. Electronically Signed   By: Keith Rake M.D.   On: 09/17/2020 15:51     Assessment & Plan    59 year old female with multiple medical problems including end-stage renal disease, diabetes, HFpEF with an EF around 40 to 50%, history of insignificant coronary disease, hypertension who was admitted after presenting to the ER being noted by family to be less responsive.  She was minimally responsive with a glucose of 24 in the ER.  She was in sinus bradycardia with heart rates in the upper 30s to lower 40s.  Of note she is on carvedilol as an outpatient.  No obvious source of infectious process however cultures are pending.  Is of note that she was here several months ago with Enterococcus in  her blood cultures.  TEE was unremarkable for endocarditis.  1.  Bradycardia-likely multifactorial but has improved since her temperature is now normal at 37 C and her glucose is improved.  She also was on carvedilol and there likely is some washout effect from this.  Would remain off of carvedilol.  Continue to wean Levophed to off..    Cultures are pending.    2.  Hypothermic/hyperglycemic-etiology unclear.  Sepsis is of concern.    Cultures are pending.  Would continue with broad-spectrum antibiotics.  3.  Evidence of effusions.  Had preserved LV function by echo several months ago.  Troponins are negative.  This does not appear to be ischemia.    No clinical evidence of heart failure at present.  Will recheck BNP.  Signed, Javier Docker Julissa Browning MD 09/18/2020, 7:26 AM  Pager: [phone]

## 2020-09-18 NOTE — Progress Notes (Signed)
[facility] hospitalist acceptance note  Patient is a 59 year old female with past medical history ofESRD on HD MWF, chronic diastolic heart failure, chronic anemia, CAD, renal cell cancer, hypertension who was admitted directly intensive care unit after being found down and unresponsive by her family.  In the emergency department she was bradycardic and hypoglycemic.  Initially was confused and oriented to self and place but not time and situation.  Almyra Free there was concern for a block considering the bradycardia however cardiology feels this is all sinus bradycardia and likely secondary to other issues.  Patient was noted to be hypothermic and has been persistently hypoglycemic.  Hypothermia has resolved.  Patient remains on broad-spectrum antibiotics.  All cultures no growth to date.  She was started on epinephrine for symptomatic bradycardia.  This is been subsequently weaned off.  Heart rate has improved to the 60s.  Given ESRD status there is a possibility of beta-blocker overdose as the patient is on Coreg.  Home Coreg has been held.  Hypoglycemia is of unclear etiology.  Patient remains on a D10 infusion.  Diabetic coordinator following.  May need to refer to endocrinology as patient has had recurrent episodes of symptomatic hypoglycemia.  [facility] hospitalist service to assume primary care of this patient on 09/19/2020  Case was discussed with ICU attending Dr. Newell Coral  Ralene Muskrat MD

## 2020-09-18 NOTE — Progress Notes (Addendum)
NAME:  [name], MRN:  [id number], DOB:  [birth year], LOS: 1 ADMISSION DATE:  09/17/2020, CONSULTATION DATE: 09-02-2020 REFERRING MD: Hulan Saas, CHIEF COMPLAINT: Encephalopathy  Brief History:  Patient found unresponsive by family.  Found to be bradycardic in the ED and hypoglycemic.  Significant past medical history of ESRD HD Monday Wednesday Friday, diastolic heart failure, anemia, coronary artery disease, renal cell cancer, pancreatitis, hypertension, hyperlipidemia, TIA, diabetes controlled by diet, COPD, asthma, depression, anxiety  History of Present Illness:  Patient found unresponsive by family.  Found to be bradycardic in the ED and hypoglycemic.  Significant past medical history of ESRD HD Monday Wednesday Friday, diastolic heart failure, anemia, coronary artery disease, renal cell cancer, pancreatitis, hypertension, hyperlipidemia, TIA, diabetes controlled by diet, COPD, asthma, depression, anxiety.  The story goes that family came and saw patient this morning and found her unresponsive.  According to them she was well at 8 PM last night.  Previously patient has been known to overuse her pain medications.   Patient confused as to why she is here upon speaking with her.  She is oriented to self and place but not to time and situation.  She states that she has cold extremities.  Does not endorse any other complaints.  Although again patient confused and intermittently following asleep during the encounter.  Patient with normal potassium to low at 3.4.  Patient also with elevated creatinine but she is end-stage renal disease.  CBC by enlarge reassuring.  Chest x-ray concerning for pulmonary edema.  TSH in the ED reassuring.  Calcium elevated at 12.0 after patient had been given calcium for cardiac stabilization   Interval history -Patient heart rate improved after being warmed, abx, and epi drip. Epi drip weaned overnight.    Past Medical History:  ESRD Diastolic heart  failure CAD Renal cell cancer COPD Asthma Depression Anxiety  Significant Hospital Events:  Patient found to be bradycardic and hypoglycemic  Consults:  Cardiology on board  will engage nephrology as well  Procedures:   NA Significant Diagnostic Tests:  NA  Micro Data:  Pending  Antimicrobials:  Vanc and zosyn  Interim History / Subjective:  Patient weaned from epinephrine being weaned from dextrose  Objective   Blood pressure 96/79, pulse 61, temperature 97.7 F (36.5 C), temperature source Axillary, resp. rate (!) 7, height 5' 5"  (1.651 m), weight 83 kg, SpO2 100 %.        Intake/Output Summary (Last 24 hours) at 09/18/2020 2694 Last data filed at 09/17/2020 1830 Gross per 24 hour  Intake 1081.31 ml  Output --  Net 1081.31 ml   Filed Weights   09/17/20 1117  Weight: 83 kg    Examination: General: Patient interactive intermittently but will fall asleep HENT: Pupils round and reactive but they are miotic Lungs: No crackles appreciated Cardiovascular: Bradycardic no murmurs noted Abdomen: Soft nontender nondistended Extremities: Warm to touch mild pitting edema in the lower extremities consistent with previously known lymphedema Neuro: Sleepy but arousable no focal deficits appreciated GU: Deferred  Resolved Hospital Problem list   Not applicable  Assessment & Plan:  This is a 59 year old with history as noted above who presents for chief complaint of altered mental status   Bradycardia-differential broad.  Per report seem like sinus bradycardia from cardiologist.  Agree that on monitor can see P waves and they do appear consistent with sinus bradycardia. Likely from beta blocker overdose, hypothermia, sepsis causing hypothermia. Has resolved. Patient off of epi -FU cards recs -Continue  broad-spectrum antibiotics for now. Have transitioned to home outpatient antibiotics for now. Follow-up cultures -Hold home beta  blocker  Encephalopathy-differential is broad.  Does have hyper Calcemia but questioning whether this was from her calcium administration on arrival.  TSH normal. Sepsis on differetial as well. Ct head reassuring. -Improved overnight   ESRD- MWF -Follow-up -Nephro on board and appreciate recommendations  Hypoglycemia-improving Patient weaned from 38 an hour of D5 10-25 an hour of D10 this morning   Thoracic vertebral osteomyeltis-Diagnosed at previous admission Vancomycin 1000 mg on M-W-F during dialysys Ceftazidime 1 gram on M-W-F during dialysis Until 10/10/20   Best practice (evaluated daily)  Diet: N.p.o. for now Pain/Anxiety/Delirium protocol (if indicated): Not indicated VAP protocol (if indicated): Head of bed at 30 degrees DVT prophylaxis: Heparin prophylaxis GI prophylaxis: Not indicated Glucose control: CBG every 2 Mobility: Bed rest Disposition: ICU  Goals of Care:  Last date of multidisciplinary goals of care discussion: Not performed Family and staff present: Not performed Summary of discussion: Not performed Follow up goals of care discussion due: Not performed Code Status: Full code for now  Labs   CBC: Recent Labs  Lab 09/17/20 1124  WBC 4.0  NEUTROABS 1.0*  HGB 9.1*  HCT 27.9*  MCV 88.0  PLT 144*    Basic Metabolic Panel: Recent Labs  Lab 09/17/20 1124 09/17/20 1345 09/17/20 1811  NA 140  --   --   K 3.4*  --   --   CL 101  --   --   CO2 31  --   --   GLUCOSE 24*  --  76  BUN 15  --   --   CREATININE 4.84*  --   --   CALCIUM 12.0*  --   --   MG  --  2.3  --   PHOS  --  3.7  --    GFR: Estimated Creatinine Clearance: 13 mL/min (A) (by C-G formula based on SCr of 4.84 mg/dL (H)). Recent Labs  Lab 09/17/20 1124 09/17/20 1731  WBC 4.0  --   LATICACIDVEN 0.7 2.1*    Liver Function Tests: Recent Labs  Lab 09/17/20 1124  AST 14*  ALT 12  ALKPHOS 62  BILITOT 0.5  PROT 6.2*  ALBUMIN 2.6*   No results for input(s): LIPASE,  AMYLASE in the last 168 hours. Recent Labs  Lab 09/17/20 1115  AMMONIA 21    ABG    Component Value Date/Time   PHART 7.35 09/17/2020 1125   PCO2ART 59 (H) 09/17/2020 1125   PO2ART 158 (H) 09/17/2020 1125   HCO3 32.6 (H) 09/17/2020 1125   TCO2 32 04/13/2020 0705   ACIDBASEDEF 0.6 11/11/2019 1252   O2SAT 99.3 09/17/2020 1125     Coagulation Profile: Recent Labs  Lab 09/17/20 1124  INR 1.2    Cardiac Enzymes: No results for input(s): CKTOTAL, CKMB, CKMBINDEX, TROPONINI in the last 168 hours.  HbA1C: Hemoglobin A1C  Date/Time Value Ref Range Status  07/19/2013 12:10 AM 9.3 (H) 4.2 - 6.3 % Final    Comment:    The American Diabetes Association recommends that a primary goal of therapy should be <7% and that physicians should reevaluate the treatment regimen in patients with HbA1c values consistently >8%.   04/01/2013 04:37 AM 7.6 (H) 4.2 - 6.3 % Final    Comment:    The American Diabetes Association recommends that a primary goal of therapy should be <7% and that physicians should reevaluate the treatment regimen in  patients with HbA1c values consistently >8%.    Hgb A1c MFr Bld  Date/Time Value Ref Range Status  08/11/2020 01:10 AM 3.9 (L) 4.8 - 5.6 % Final    Comment:    (NOTE) Pre diabetes:          5.7%-6.4%  Diabetes:              >6.4%  Glycemic control for   <7.0% adults with diabetes   05/03/2020 03:30 PM 5.7 (H) 4.8 - 5.6 % Final    Comment:    (NOTE)         Prediabetes: 5.7 - 6.4         Diabetes: >6.4         Glycemic control for adults with diabetes: <7.0     CBG: Recent Labs  Lab 09/17/20 1800 09/17/20 1918 09/17/20 2307 09/18/20 0302 09/18/20 0729  GLUCAP 51* 95 173* 227* 220*    Review of Systems:   Difficult to obtain pertinent positives negatives per HPI  Past Medical History:  She,  has a past medical history of Anemia, Anginal pain ([place]), Anxiety, Arthritis, Asthma, Broken wrist, Bronchitis, chronic diastolic CHF  (3/53/6144), Chronic kidney disease, COPD (chronic obstructive pulmonary disease) ([place]), Coronary artery disease, Depression, Diabetes mellitus ([place]), Diabetes mellitus without complication ([place]), Diabetic neuropathy ([place]), dialysis (2006), Diverticulosis, Dizziness, Dyspnea, Elevated lipids, Environmental and seasonal allergies, ESRD (end stage renal disease) on dialysis ([place]), Gastroparesis, GERD (gastroesophageal reflux disease), Headache, History of anemia due to chronic kidney disease, History of hiatal hernia, HOH (hard of hearing), pancreatitis (2015), Hypertension, Lower extremity edema, Mitral regurgitation, Myocardial infarction ([place]), Orthopnea, Parathyroid abnormality ([employer]), Peripheral arterial disease ([place]), Pneumonia, Renal cancer ([place]), Renal insufficiency, and Wheezing.   Surgical History:   Past Surgical History:  Procedure Laterality Date  . A/V SHUNTOGRAM Left 01/20/2018   Procedure: A/V SHUNTOGRAM;  Surgeon: Algernon Huxley, MD;  Location: [place] [facility];  Service: Cardiovascular;  Laterality: Left;  . ABDOMINAL HYSTERECTOMY  1992  . AMPUTATION TOE Left 10/02/2017   Procedure: AMPUTATION TOE-LEFT GREAT TOE;  Surgeon: Albertine Patricia, DPM;  Location: [facility];  Service: Podiatry;  Laterality: Left;  . APPENDECTOMY    . APPLICATION OF WOUND VAC N/A 11/25/2019   Procedure: APPLICATION OF WOUND VAC;  Surgeon: Katha Cabal, MD;  Location: [facility];  Service: Vascular;  Laterality: N/A;  . ARTERY BIOPSY Right 10/11/2018   Procedure: BIOPSY TEMPORAL ARTERY;  Surgeon: Vickie Epley, MD;  Location: [facility];  Service: General;  Laterality: Right;  . CARDIAC CATHETERIZATION Left 07/26/2015   Procedure: Left Heart Cath and Coronary Angiography;  Surgeon: Dionisio David, MD;  Location: [place] [facility];  Service: Cardiovascular;  Laterality: Left;  . CATARACT EXTRACTION W/ INTRAOCULAR LENS IMPLANT Right   . CATARACT EXTRACTION W/PHACO Left 03/10/2017   Procedure: CATARACT  EXTRACTION PHACO AND INTRAOCULAR LENS PLACEMENT (IOC);  Surgeon: Birder Robson, MD;  Location: [facility];  Service: Ophthalmology;  Laterality: Left;  [place] 00:51.9 AP% 14.2 CDE 7.39 fluid pack lot # [id number]  . CENTRAL LINE INSERTION Right 11/11/2019   Procedure: CENTRAL LINE INSERTION;  Surgeon: Katha Cabal, MD;  Location: [facility];  Service: Vascular;  Laterality: Right;  . CENTRAL LINE INSERTION  11/25/2019   Procedure: CENTRAL LINE INSERTION;  Surgeon: Katha Cabal, MD;  Location: [facility];  Service: Vascular;;  . CHOLECYSTECTOMY    . COLONOSCOPY WITH PROPOFOL N/A 08/12/2016   Procedure: COLONOSCOPY WITH PROPOFOL;  Surgeon: Hassell Done  Kassie Mends, MD;  Location: [facility];  Service: Endoscopy;  Laterality: N/A;  . DIALYSIS FISTULA CREATION Left    upper arm  . dialysis grafts    . DIALYSIS/PERMA CATHETER INSERTION N/A 11/14/2019   Procedure: DIALYSIS/PERMA CATHETER INSERTION;  Surgeon: Algernon Huxley, MD;  Location: [place] [facility];  Service: Cardiovascular;  Laterality: N/A;  . DIALYSIS/PERMA CATHETER INSERTION N/A 02/03/2020   Procedure: DIALYSIS/PERMA CATHETER INSERTION;  Surgeon: Katha Cabal, MD;  Location: [place] [facility];  Service: Cardiovascular;  Laterality: N/A;  . DIALYSIS/PERMA CATHETER INSERTION N/A 06/19/2020   Procedure: DIALYSIS/PERMA CATHETER INSERTION;  Surgeon: Katha Cabal, MD;  Location: [place] [facility];  Service: Cardiovascular;  Laterality: N/A;  . DIALYSIS/PERMA CATHETER REMOVAL N/A 05/25/2020   Procedure: DIALYSIS/PERMA CATHETER REMOVAL;  Surgeon: Katha Cabal, MD;  Location: [place] [facility];  Service: Cardiovascular;  Laterality: N/A;  . ESOPHAGOGASTRODUODENOSCOPY N/A 03/08/2015   Procedure: ESOPHAGOGASTRODUODENOSCOPY (EGD);  Surgeon: Manya Silvas, MD;  Location: [facility];  Service: Endoscopy;  Laterality: N/A;  . ESOPHAGOGASTRODUODENOSCOPY (EGD) WITH PROPOFOL N/A 03/18/2016   Procedure:  ESOPHAGOGASTRODUODENOSCOPY (EGD) WITH PROPOFOL;  Surgeon: [name], MD;  Location: [facility];  Service: Endoscopy;  Laterality: N/A;  . EYE SURGERY Right 2018  . FECAL TRANSPLANT N/A 08/23/2015   Procedure: FECAL TRANSPLANT;  Surgeon: Manya Silvas, MD;  Location: [facility];  Service: Endoscopy;  Laterality: N/A;  . HAND SURGERY Bilateral   . HEMATOMA EVACUATION Left 11/25/2019   Procedure: EVACUATION HEMATOMA;  Surgeon: Katha Cabal, MD;  Location: [facility];  Service: Vascular;  Laterality: Left;  . I & D EXTREMITY Left 11/25/2019   Procedure: IRRIGATION AND DEBRIDEMENT EXTREMITY;  Surgeon: Katha Cabal, MD;  Location: [facility];  Service: Vascular;  Laterality: Left;  . IR FLUORO GUIDE CV LINE RIGHT  04/06/2020  . IR INJECT/THERA/INC NEEDLE/CATH/PLC EPI/CERV/THOR [facility]  08/13/2020  . IR RADIOLOGIST EVAL & MGMT  07/28/2019  . IR RADIOLOGIST EVAL & MGMT  08/11/2019  . LIGATION OF ARTERIOVENOUS  FISTULA Left 11/11/2019  . LIGATION OF ARTERIOVENOUS  FISTULA Left 11/11/2019   Procedure: LIGATION OF ARTERIOVENOUS  FISTULA;  Surgeon: Katha Cabal, MD;  Location: [facility];  Service: Vascular;  Laterality: Left;  . LIGATIONS OF HERO GRAFT Right 06/13/2020   Procedure: LIGATION / REMOVAL OF RIGHT HERO GRAFT;  Surgeon: Katha Cabal, MD;  Location: [facility];  Service: Vascular;  Laterality: Right;  . PERIPHERAL VASCULAR CATHETERIZATION N/A 12/20/2015   Procedure: Thrombectomy of dialysis access versus permcath placement;  Surgeon: Algernon Huxley, MD;  Location: [place] [facility];  Service: Cardiovascular;  Laterality: N/A;  . PERIPHERAL VASCULAR CATHETERIZATION N/A 12/20/2015   Procedure: A/V Shunt Intervention;  Surgeon: Algernon Huxley, MD;  Location: [place] [facility];  Service: Cardiovascular;  Laterality: N/A;  . PERIPHERAL VASCULAR CATHETERIZATION N/A 12/20/2015   Procedure: A/V Shuntogram/Fistulagram;  Surgeon: Algernon Huxley, MD;  Location: [place] [facility];  Service:  Cardiovascular;  Laterality: N/A;  . PERIPHERAL VASCULAR CATHETERIZATION N/A 01/02/2016   Procedure: A/V Shuntogram/Fistulagram;  Surgeon: Algernon Huxley, MD;  Location: [place] [facility];  Service: Cardiovascular;  Laterality: N/A;  . PERIPHERAL VASCULAR CATHETERIZATION N/A 01/02/2016   Procedure: A/V Shunt Intervention;  Surgeon: Algernon Huxley, MD;  Location: [place] [facility];  Service: Cardiovascular;  Laterality: N/A;  . TEE WITHOUT CARDIOVERSION N/A 06/11/2020   Procedure: TRANSESOPHAGEAL ECHOCARDIOGRAM (TEE);  Surgeon: Teodoro Spray, MD;  Location:  [facility];  Service: Cardiovascular;  Laterality: N/A;  . UPPER EXTREMITY VENOGRAPHY Right 01/18/2020   Procedure: UPPER EXTREMITY VENOGRAPHY;  Surgeon: Katha Cabal, MD;  Location: [place] [facility];  Service: Cardiovascular;  Laterality: Right;  Marland Kitchen VASCULAR ACCESS DEVICE INSERTION Right 04/13/2020   Procedure: INSERTION OF HERO VASCULAR ACCESS DEVICE (GRAFT);  Surgeon: Katha Cabal, MD;  Location: [facility];  Service: Vascular;  Laterality: Right;     Social History:   reports that she has never smoked. She has never used smokeless tobacco. She reports previous alcohol use. She reports current drug use. Drug: Marijuana.   Family History:  Her family history includes Cancer in her father; Diabetes in her mother; Kidney disease in her mother and sister.   Allergies Allergies  Allergen Reactions  . Ace Inhibitors Swelling and Anaphylaxis  . Ativan [Lorazepam] Other (See Comments)    Reaction:Hallucinations and headaches  . Compazine [Prochlorperazine Edisylate] Anaphylaxis, Nausea And Vomiting and Other (See Comments)    Other reaction(s): dystonia from this vs. Reglan, 23 Jul - patient relates that she takes promethazine frequently with no problems  . Sumatriptan Succinate Other (See Comments)    Other reaction(s): delirium and hallucinations per [facility] records  . Zofran [Ondansetron] Nausea And Vomiting    Per pt. she is  allergic to zofran or will experience adverse reaction like hallucination   . Losartan Nausea Only  . Prochlorperazine Other (See Comments)    Reaction:  Unknown . Patient does not remember reaction but she does have vertigo and anxiety along with n and v at times. Could be used to treat any of these   . Reglan [Metoclopramide] Other (See Comments)    Per patient her Dr. Evelina Bucy her off it   . Scopolamine Other (See Comments)    Dizziness, also has vertigo already  . Tape Rash    Plastic tape causes rash  . Tapentadol Rash     Home Medications  Prior to Admission medications   Medication Sig Start Date End Date Taking? Authorizing Provider  albuterol (PROVENTIL) (2.5 MG/3ML) 0.083% nebulizer solution Take 2.5 mg by nebulization every 4 (four) hours as needed for wheezing or shortness of breath.    [provider]  albuterol (VENTOLIN HFA) 108 (90 Base) MCG/ACT inhaler Inhale 2 puffs into the lungs every 4 (four) hours as needed for wheezing or shortness of breath. 11/09/19   [provider]  alum & mag hydroxide-simeth (MAALOX/MYLANTA) 200-200-20 MG/5ML suspension Take 15 mLs by mouth every 6 (six) hours as needed for indigestion or heartburn. 06/25/20   Enzo Bi, MD  amLODipine (NORVASC) 10 MG tablet Take 1 tablet (10 mg total) by mouth daily. 06/25/20   Enzo Bi, MD  ammonium lactate (LAC-HYDRIN) 12 % lotion Apply 1 application topically 2 (two) times daily as needed for dry skin.     [provider]  Ascorbic Acid (VITA-C PO) Take 1 tablet by mouth daily.    [provider]  aspirin EC 81 MG tablet Take 81 mg by mouth daily.    [provider]  atorvastatin (LIPITOR) 20 MG tablet Take 20 mg by mouth every evening.  10/24/19   [provider]  calcium carbonate (TUMS - DOSED IN MG ELEMENTAL CALCIUM) 500 MG chewable tablet Chew 2.5 tablets (500 mg of elemental calcium total) by mouth every 6 (six) hours as needed for indigestion. 06/25/20    Enzo Bi, MD  carvedilol (COREG) 6.25 MG tablet Take 6.25 mg by mouth  2 (two) times daily with a meal.    [provider]  cefTAZidime (FORTAZ) IVPB Inject 1 g into the vein every Monday, Wednesday, and Friday with hemodialysis. Indication: Thoracic vertebral osteomyelitis with disciits First Dose: Yes Last Day of Therapy:  10/10/20 Labs - Once weekly:  CBC/D, CMP, ESR, vancomycin trough   Method of administration may be changed at the discretion of home infusion pharmacist based upon assessment of the patient and/or caregiver's ability to self-administer the medication ordered. 08/17/20   Wyvonnia Dusky, MD  Cholecalciferol (VITAMIN D3) 25 MCG (1000 UT) CAPS Take 1,000 Units by mouth daily.     [provider]  cholestyramine light (PREVALITE) 4 g packet Take 4 g by mouth 2 (two) times daily.    [provider]  CREON 12000 units CPEP capsule Take 36,000 Units by mouth 3 (three) times daily before meals.  10/21/19   [provider]  cyanocobalamin 1000 MCG tablet Take 1,000 mcg by mouth daily.     [provider]  diphenhydrAMINE (BENADRYL) 25 MG tablet Take 25 mg by mouth every 6 (six) hours as needed for itching.    [provider]  epoetin alfa (EPOGEN) 10000 UNIT/ML injection Inject 1 mL (10,000 Units total) into the vein every Monday, Wednesday, and Friday with hemodialysis. 06/27/20   Enzo Bi, MD  feeding supplement, ENSURE ENLIVE, (ENSURE ENLIVE) LIQD Take 237 mLs by mouth daily. 06/26/20   Enzo Bi, MD  Ferrous Sulfate (IRON) 325 (65 Fe) MG TABS Take 325 mg by mouth daily.     [provider]  furosemide (LASIX) 80 MG tablet Take 80 mg by mouth daily. 05/08/20   [provider]  gabapentin (NEURONTIN) 100 MG capsule Take 100 mg by mouth every evening.     [provider]  hydrALAZINE (APRESOLINE) 50 MG tablet Take 1 tablet (50 mg total) by mouth every 8 (eight) hours. 06/25/20   Enzo Bi, MD   HYDROcodone-acetaminophen (NORCO/VICODIN) 5-325 MG tablet Take 1 tablet by mouth 3 (three) times daily as needed for moderate pain or severe pain. 07/26/20   [provider]  hydrOXYzine (ATARAX/VISTARIL) 25 MG tablet Take 25 mg by mouth daily.     [provider]  multivitamin (RENA-VIT) TABS tablet Take 1 tablet by mouth daily.     [provider]  mupirocin ointment (BACTROBAN) 2 % Apply 1 application topically daily as needed (leg rash).  11/09/19   [provider]  nitroGLYCERIN (NITROSTAT) 0.4 MG SL tablet Place 0.4 mg under the tongue every 5 (five) minutes as needed for chest pain.     [provider]  Omega-3 300 MG CAPS Take 300 mg by mouth 2 (two) times daily. Every other day opposite omega XL    [provider]  pantoprazole (PROTONIX) 40 MG tablet Take 40 mg by mouth daily. 08/28/19   [provider]  polyethylene glycol (MIRALAX / GLYCOLAX) 17 g packet Take 17 g by mouth daily. 06/25/20   Enzo Bi, MD  Probiotic Product (PROBIOTIC PO) Take 1 capsule by mouth daily.    [provider]  promethazine (PHENERGAN) 25 MG tablet Take 1 tablet (25 mg total) by mouth every 6 (six) hours as needed for nausea or vomiting. 06/25/20   Enzo Bi, MD  sevelamer carbonate (RENVELA) 800 MG tablet Take 800 mg by mouth 4 (four) times daily. (at meals and with a snack)    [provider]  vancomycin IVPB Inject 1,000 mg into  the vein every Monday, Wednesday, and Friday with hemodialysis. Indication:  Thoracic vertebral osteomyelitis with disciits First Dose: Yes Last Day of Therapy:  10/10/20 Labs - Weekly:  CBC/D, CMP, vancomycin trough, ESR Method of administration:Elastomeric Method of administration may be changed at the discretion of the patient and/or caregiver's ability to self-administer the medication ordered. 08/17/20   Wyvonnia Dusky, MD     Critical care time: 35 minutes.

## 2020-09-18 NOTE — Progress Notes (Signed)
Inpatient Diabetes Program Recommendations  AACE/ADA: New Consensus Statement on Inpatient Glycemic Control   Target Ranges:  Prepandial:   less than 140 mg/dL      Peak postprandial:   less than 180 mg/dL (1-2 hours)      Critically ill patients:  140 - 180 mg/dL  Results for KHAMANI, DANIELY (MRN [id number]) as of 09/18/2020 07:40  Ref. Range 09/17/2020 12:34 09/17/2020 13:22 09/17/2020 13:54 09/17/2020 14:46 09/17/2020 16:04 09/17/2020 16:42 09/17/2020 18:00 09/17/2020 19:18 09/17/2020 23:07 09/18/2020 03:02 09/18/2020 07:29  Glucose-Capillary Latest Ref Range: 70 - 99 mg/dL 89 26 (LL) 69 (L) 66 (L) 30 (LL) 52 (L) 51 (L) 95 173 (H) 227 (H) 220 (H)    Review of Glycemic Control  Diabetes history:DM2 (diet controlled) Outpatient Diabetes medications: None Current orders for Inpatient glycemic control: CBGs Q2H; D10 @ 50 ml/hr  Inpatient Diabetes Program Recommendations:    Insulin: Please consider ordering CBGs AC&HS and very sensitive correction scale Novolog 0-6 units TID with meals.  Thanks, Barnie Alderman, RN, MSN, CDE Diabetes Coordinator Inpatient Diabetes Program [phone] (Team Pager from 8am to 5pm)

## 2020-09-18 NOTE — Progress Notes (Signed)
Agreed to lab work this morning with a different phlebotomist. Pulse is now NSR, epinephrine is off at this time. Temperature has normalized as well.

## 2020-09-18 NOTE — Progress Notes (Signed)
IO site has been oozing blood since it was dc'd, Dressing has been changed twice and I have held pressure as long as the patient could tolerate.

## 2020-09-18 NOTE — Progress Notes (Signed)
[facility]  ROUNDING NOTE   Subjective:  Patient doing much better today. Currently awake, alert, conversant. Missed dialysis treatment yesterday. Was hypotensive yesterday but blood pressure improved and currently off pressors. Patient due for dialysis treatment today.   Objective:  Vital signs in last 24 hours:  Temp:  [90 F (32.2 C)-100.5 F (38.1 C)] 97.7 F (36.5 C) (12/28 0815) Pulse Rate:  [35-77] 61 (12/28 0820) Resp:  [6-22] 7 (12/28 0815) BP: (79-150)/(42-82) 96/79 (12/28 0820) SpO2:  [81 %-100 %] 100 % (12/28 0820) Weight:  [83 kg] 83 kg (12/27 1117)  Weight change:  Filed Weights   09/17/20 1117  Weight: 83 kg    Intake/Output: I/O last 3 completed shifts: In: 1081.3 [P.O.:840; I.V.:241.3] Out: -    Intake/Output this shift:  No intake/output data recorded.  Physical Exam: General:  No acute distress  Head:  Normocephalic, atraumatic. Moist oral mucosal membranes  Eyes:  Anicteric  Neck:  Supple  Lungs:   Clear to auscultation, normal effort  Heart:  S1S2 bradycardic, heart rate 61  Abdomen:   Soft, nontender, bowel sounds present  Extremities:  1+ peripheral edema.  Neurologic:  Awake, alert, following commands  Skin:  Warm/dry  Access:  PermCath in place    Basic Metabolic Panel: Recent Labs  Lab 09/17/20 1124 09/17/20 1345 09/17/20 1811  NA 140  --   --   K 3.4*  --   --   CL 101  --   --   CO2 31  --   --   GLUCOSE 24*  --  76  BUN 15  --   --   CREATININE 4.84*  --   --   CALCIUM 12.0*  --   --   MG  --  2.3  --   PHOS  --  3.7  --     Liver Function Tests: Recent Labs  Lab 09/17/20 1124  AST 14*  ALT 12  ALKPHOS 62  BILITOT 0.5  PROT 6.2*  ALBUMIN 2.6*   No results for input(s): LIPASE, AMYLASE in the last 168 hours. Recent Labs  Lab 09/17/20 1115  AMMONIA 21    CBC: Recent Labs  Lab 09/17/20 1124  WBC 4.0  NEUTROABS 1.0*  HGB 9.1*  HCT 27.9*  MCV 88.0  PLT 144*    Cardiac Enzymes: No  results for input(s): CKTOTAL, CKMB, CKMBINDEX, TROPONINI in the last 168 hours.  BNP: Invalid input(s): POCBNP  CBG: Recent Labs  Lab 09/17/20 1800 09/17/20 1918 09/17/20 2307 09/18/20 0302 09/18/20 0729  GLUCAP 51* 95 173* 227* 220*    Microbiology: Results for orders placed or performed during the hospital encounter of 09/17/20  Resp Panel by RT-PCR (Flu A&B, Covid) Nasopharyngeal Swab     Status: None   Collection Time: 09/17/20 11:24 AM   Specimen: Nasopharyngeal Swab; Nasopharyngeal(NP) swabs in vial transport medium  Result Value Ref Range Status   SARS Coronavirus 2 by RT PCR NEGATIVE NEGATIVE Final    Comment: (NOTE) SARS-CoV-2 target nucleic acids are NOT DETECTED.  The SARS-CoV-2 RNA is generally detectable in upper respiratory specimens during the acute phase of infection. The lowest concentration of SARS-CoV-2 viral copies this assay can detect is 138 copies/mL. A negative result does not preclude SARS-Cov-2 infection and should not be used as the sole basis for treatment or other patient management decisions. A negative result may occur with  improper specimen collection/handling, submission of specimen other than nasopharyngeal swab, presence of viral  mutation(s) within the areas targeted by this assay, and inadequate number of viral copies(<138 copies/mL). A negative result must be combined with clinical observations, patient history, and epidemiological information. The expected result is Negative.  Fact Sheet for Patients:  EntrepreneurPulse.com.au  Fact Sheet for Healthcare Providers:  IncredibleEmployment.be  This test is no t yet approved or cleared by the [place] FDA and  has been authorized for detection and/or diagnosis of SARS-CoV-2 by FDA under an Emergency Use Authorization (EUA). This EUA will remain  in effect (meaning this test can be used) for the duration of the COVID-19 declaration under Section  564(b)(1) of the Act, 21 U.S.C.section 360bbb-3(b)(1), unless the authorization is terminated  or revoked sooner.       Influenza A by PCR NEGATIVE NEGATIVE Final   Influenza B by PCR NEGATIVE NEGATIVE Final    Comment: (NOTE) The Xpert Xpress SARS-CoV-2/FLU/RSV plus assay is intended as an aid in the diagnosis of influenza from Nasopharyngeal swab specimens and should not be used as a sole basis for treatment. Nasal washings and aspirates are unacceptable for Xpert Xpress SARS-CoV-2/FLU/RSV testing.  Fact Sheet for Patients: EntrepreneurPulse.com.au  Fact Sheet for Healthcare Providers: IncredibleEmployment.be  This test is not yet approved or cleared by the [place] FDA and has been authorized for detection and/or diagnosis of SARS-CoV-2 by FDA under an Emergency Use Authorization (EUA). This EUA will remain in effect (meaning this test can be used) for the duration of the COVID-19 declaration under Section 564(b)(1) of the Act, 21 U.S.C. section 360bbb-3(b)(1), unless the authorization is terminated or revoked.  Performed at [facility], [address] [place], [place] [address]   Culture, blood (routine x 2)     Status: None (Preliminary result)   Collection Time: 09/17/20  2:31 PM   Specimen: BLOOD  Result Value Ref Range Status   Specimen Description BLOOD BLOOD LEFT WRIST  Final   Special Requests   Final    BOTTLES DRAWN AEROBIC AND ANAEROBIC Blood Culture adequate volume   Culture   Final    NO GROWTH < 12 HOURS Performed at [facility], [address], [place], [place] [address]    Report Status PENDING  Incomplete  MRSA PCR Screening     Status: None   Collection Time: 09/17/20  4:26 PM   Specimen: Nasopharyngeal  Result Value Ref Range Status   MRSA by PCR NEGATIVE NEGATIVE Final    Comment:        The GeneXpert MRSA Assay (FDA approved for NASAL specimens only), is one component of  a comprehensive MRSA colonization surveillance program. It is not intended to diagnose MRSA infection nor to guide or monitor treatment for MRSA infections. Performed at [facility], [address] [place], [place] [address]     Coagulation Studies: Recent Labs    09/17/20 1124  LABPROT 14.5  INR 1.2    Urinalysis: Recent Labs    09/17/20 1124  COLORURINE YELLOW*  LABSPEC 1.011  PHURINE 7.0  GLUCOSEU NEGATIVE  HGBUR NEGATIVE  BILIRUBINUR NEGATIVE  KETONESUR NEGATIVE  PROTEINUR >=300*  NITRITE NEGATIVE  LEUKOCYTESUR NEGATIVE      Imaging: DG Chest 1 View  Result Date: 09/17/2020 CLINICAL DATA:  Altered mental status EXAM: CHEST  1 VIEW COMPARISON:  08/10/2020 FINDINGS: Mild cardiac enlargement. Diffuse bilateral airspace disease is symmetric and has progressed in the interval. Small bilateral pleural effusions. Dual lumen central venous catheter tip in the right atrium unchanged. Left subclavian vascular  stent unchanged. IMPRESSION: Progression of diffuse bilateral airspace disease most likely edema. Small bilateral effusions. Electronically Signed   By: Franchot Gallo M.D.   On: 09/17/2020 11:36   CT Head Wo Contrast  Result Date: 09/17/2020 CLINICAL DATA:  Altered mental status. Found unresponsive. EXAM: CT HEAD WITHOUT CONTRAST TECHNIQUE: Contiguous axial images were obtained from the base of the skull through the vertex without intravenous contrast. COMPARISON:  Most recent head CT 06/11/2020 FINDINGS: Brain: No intracranial hemorrhage, mass effect, or midline shift. No hydrocephalus. The basilar cisterns are patent. No evidence of territorial infarct or acute ischemia. No extra-axial or intracranial fluid collection. Vascular: Atherosclerosis of skullbase vasculature without hyperdense vessel or abnormal calcification. Skull: No fracture or focal lesion. Sinuses/Orbits: Paranasal sinuses and mastoid air cells are clear. The visualized orbits are  unremarkable. Bilateral cataract resection. Other: None. IMPRESSION: No acute intracranial abnormality. Electronically Signed   By: Keith Rake M.D.   On: 09/17/2020 15:51     Medications:   . sodium chloride    . ceFEPime (MAXIPIME) IV 2 g (09/18/20 0914)  . dextrose 25 mL/hr at 09/18/20 0822  . epinephrine Stopped (09/18/20 0625)  . norepinephrine (LEVOPHED) Adult infusion 2 mcg/min (09/18/20 0344)   . Chlorhexidine Gluconate Cloth  6 each Topical Q0600  . Chlorhexidine Gluconate Cloth  6 each Topical Q0600  . heparin  5,000 Units Subcutaneous Q8H  . sodium chloride flush  3 mL Intravenous Q12H  . vancomycin variable dose per unstable renal function (pharmacist dosing)   Does not apply See admin instructions   sodium chloride, acetaminophen, docusate sodium, heparin, HYDROcodone-acetaminophen, morphine injection, polyethylene glycol, sodium chloride flush  Assessment/ Plan:  59 y.o. female with past medical history of ESRD on HD MWF disease, secondary, chronic diastolic heart failure, diabetes mellitus type 2, thoracic discitis who presents now with hypothermia, hypoglycemia, hypotension, bradycardia.  1.  ESRD on HD MWF.  Patient did not undergo dialysis yesterday and she was unstable.  We will plan for hemodialysis today and get her back on her regular schedule tomorrow.  2.  Hypotension/bradycardia/hypoglycemia.  Significantly improved.  Currently off of epinephrine and norepinephrine drip.  Heart rate improved to 61.  3.  Anemia of chronic kidney disease.  Hemoglobin 9.9.  Start the patient on Epogen tomorrow.  4.  Secondary hyperparathyroidism.  Check serum phosphorus tomorrow.    LOS: 1 [name] 12/28/20219:26 AM

## 2020-09-19 DIAGNOSIS — R001 Bradycardia, unspecified: Secondary | ICD-10-CM | POA: Diagnosis not present

## 2020-09-19 LAB — URINE CULTURE: Culture: 70000 — AB

## 2020-09-19 LAB — GLUCOSE, CAPILLARY
Glucose-Capillary: 93 mg/dL (ref 70–99)
Glucose-Capillary: 88 mg/dL (ref 70–99)
Glucose-Capillary: 110 mg/dL — ABNORMAL HIGH (ref 70–99)
Glucose-Capillary: 76 mg/dL (ref 70–99)
Glucose-Capillary: 79 mg/dL (ref 70–99)
Glucose-Capillary: 88 mg/dL (ref 70–99)

## 2020-09-19 MED ORDER — GABAPENTIN 100 MG PO CAPS
100.0000 mg | ORAL_CAPSULE | Freq: Every evening | ORAL | Status: DC
Start: 2020-09-19 — End: 2020-09-21
  Administered 2020-09-19 – 2020-09-20 (×2): 100 mg via ORAL
  Filled 2020-09-19 (×2): qty 1

## 2020-09-19 MED ORDER — PANCRELIPASE (LIP-PROT-AMYL) 12000-38000 UNITS PO CPEP
36000.0000 [IU] | ORAL_CAPSULE | Freq: Three times a day (TID) | ORAL | Status: DC
  Administered 2020-09-19 – 2020-09-21 (×5): 36000 [IU] via ORAL
  Filled 2020-09-19 (×7): qty 3

## 2020-09-19 MED ORDER — CARVEDILOL 6.25 MG PO TABS
6.2500 mg | ORAL_TABLET | Freq: Two times a day (BID) | ORAL | Status: DC
  Administered 2020-09-19: 6.25 mg via ORAL
  Filled 2020-09-19: qty 1

## 2020-09-19 MED ORDER — SEVELAMER CARBONATE 800 MG PO TABS
800.0000 mg | ORAL_TABLET | Freq: Three times a day (TID) | ORAL | Status: DC
Start: 2020-09-19 — End: 2020-09-21
  Administered 2020-09-19 – 2020-09-21 (×5): 800 mg via ORAL
  Filled 2020-09-19 (×7): qty 1

## 2020-09-19 MED ORDER — SEVELAMER CARBONATE 800 MG PO TABS
800.0000 mg | ORAL_TABLET | Freq: Every day | ORAL | Status: DC | PRN
  Filled 2020-09-19: qty 1

## 2020-09-19 MED ORDER — DEXTROSE 50 % IV SOLN: 1.0000 | INTRAVENOUS | Status: DC | PRN

## 2020-09-19 MED ORDER — SODIUM CHLORIDE 0.9 % IV SOLN
1.0000 g | INTRAVENOUS | Status: DC
  Administered 2020-09-20: 16:00:00 1 g via INTRAVENOUS
  Filled 2020-09-19 (×2): qty 1

## 2020-09-19 MED ORDER — HYDRALAZINE HCL 50 MG PO TABS
50.0000 mg | ORAL_TABLET | Freq: Three times a day (TID) | ORAL | Status: DC
Start: 2020-09-19 — End: 2020-09-21
  Administered 2020-09-19 – 2020-09-21 (×5): 50 mg via ORAL
  Filled 2020-09-19 (×5): qty 1

## 2020-09-19 MED ORDER — ATORVASTATIN CALCIUM 20 MG PO TABS
20.0000 mg | ORAL_TABLET | Freq: Every evening | ORAL | Status: DC
  Administered 2020-09-19 – 2020-09-20 (×2): 20 mg via ORAL
  Filled 2020-09-19 (×2): qty 1

## 2020-09-19 MED ORDER — AMLODIPINE BESYLATE 10 MG PO TABS
10.0000 mg | ORAL_TABLET | Freq: Every day | ORAL | Status: DC
  Administered 2020-09-19 – 2020-09-21 (×3): 10 mg via ORAL
  Filled 2020-09-19 (×3): qty 1

## 2020-09-19 MED ORDER — ASPIRIN EC 81 MG PO TBEC
81.0000 mg | DELAYED_RELEASE_TABLET | Freq: Every day | ORAL | Status: DC
  Administered 2020-09-19 – 2020-09-21 (×3): 81 mg via ORAL
  Filled 2020-09-19 (×4): qty 1

## 2020-09-19 MED ORDER — VANCOMYCIN HCL IN DEXTROSE 1-5 GM/200ML-% IV SOLN
1000.0000 mg | INTRAVENOUS | Status: DC
  Administered 2020-09-20: 1000 mg via INTRAVENOUS
  Filled 2020-09-19 (×2): qty 200

## 2020-09-19 MED ORDER — DIPHENHYDRAMINE HCL 25 MG PO CAPS
25.0000 mg | ORAL_CAPSULE | Freq: Four times a day (QID) | ORAL | Status: DC | PRN
  Administered 2020-09-19: 25 mg via ORAL
  Filled 2020-09-19: qty 1

## 2020-09-19 MED ORDER — DEXTROSE 5 % IV SOLN: INTRAVENOUS | Status: DC

## 2020-09-19 NOTE — Progress Notes (Signed)
Pt's son called pt while I was in the room and pt asked me to update her son on her care. I spoke to him [name]) on the phone and answered his questions but his questions were more directed for the doctor so I asked if he'd like a call from the doctor and he said yes. Sent the doctor a message to call son with phone number and doctor agreed to.  Son called me back 20 min later asking about her medications inpatient and what she'll be discharged on. Referred him back to the doctor but doctor said he tried calling son "several times" but no answer. I checked with the son and the phone number was correct so they were able to get in touch after that.

## 2020-09-19 NOTE — Progress Notes (Signed)
Hemodialysis patient known at [place] 6:15. There is no change to schedule for the holiday. Patient rides transportation. Please contact me with any dialysis placement concerns.  Elvera Bicker Dialysis Coordinator [phone]

## 2020-09-19 NOTE — Progress Notes (Signed)
[facility]  ROUNDING NOTE   Subjective:  Patient had hemodialysis treatment yesterday. Tolerated well. Resting comfortably in bed at the moment.   Objective:  Vital signs in last 24 hours:  Temp:  [97.7 F (36.5 C)-99.9 F (37.7 C)] 98.8 F (37.1 C) (12/29 1125) Pulse Rate:  [58-73] 66 (12/29 1125) Resp:  [7-20] 17 (12/29 1125) BP: (105-155)/(47-70) 155/66 (12/29 1125) SpO2:  [90 %-100 %] 100 % (12/29 1125)  Weight change:  Filed Weights   09/17/20 1117  Weight: 83 kg    Intake/Output: I/O last 3 completed shifts: In: 2651.9 [P.O.:480; I.V.:1952.4; IV Piggyback:219.4] Out: 398 [Other:398]   Intake/Output this shift:  No intake/output data recorded.  Physical Exam: General:  No acute distress  Head:  Normocephalic, atraumatic. Moist oral mucosal membranes  Eyes:  Anicteric  Neck:  Supple  Lungs:   Clear to auscultation, normal effort  Heart:  S1S2 no rubs  Abdomen:   Soft, nontender, bowel sounds present  Extremities:  1+ peripheral edema.  Neurologic:  Awake, alert, following commands  Skin:  Warm/dry  Access:  PermCath in place    Basic Metabolic Panel: Recent Labs  Lab 09/17/20 1124 09/17/20 1345 09/17/20 1811 09/18/20 1506  NA 140  --   --  134*  133*  K 3.4*  --   --  4.3  4.3  CL 101  --   --  97*  97*  CO2 31  --   --  27  27  GLUCOSE 24*  --  76 86  87  BUN 15  --   --  19  19  CREATININE 4.84*  --   --  5.56*  5.60*  CALCIUM 12.0*  --   --  8.9  8.8*  MG  --  2.3  --  2.2  PHOS  --  3.7  --  4.0  3.9    Liver Function Tests: Recent Labs  Lab 09/17/20 1124 09/18/20 1506  AST 14*  --   ALT 12  --   ALKPHOS 62  --   BILITOT 0.5  --   PROT 6.2*  --   ALBUMIN 2.6* 2.4*   No results for input(s): LIPASE, AMYLASE in the last 168 hours. Recent Labs  Lab 09/17/20 1115  AMMONIA 21    CBC: Recent Labs  Lab 09/17/20 1124 09/18/20 1506  WBC 4.0 5.4  NEUTROABS 1.0*  --   HGB 9.1* 8.3*  HCT 27.9* 25.5*   MCV 88.0 86.4  PLT 144* 102*    Cardiac Enzymes: No results for input(s): CKTOTAL, CKMB, CKMBINDEX, TROPONINI in the last 168 hours.  BNP: Invalid input(s): POCBNP  CBG: Recent Labs  Lab 09/18/20 1643 09/18/20 2126 09/19/20 0750 09/19/20 1126 09/19/20 1210  GLUCAP 82 81 93 88 110*    Microbiology: Results for orders placed or performed during the hospital encounter of 09/17/20  Resp Panel by RT-PCR (Flu A&B, Covid) Nasopharyngeal Swab     Status: None   Collection Time: 09/17/20 11:24 AM   Specimen: Nasopharyngeal Swab; Nasopharyngeal(NP) swabs in vial transport medium  Result Value Ref Range Status   SARS Coronavirus 2 by RT PCR NEGATIVE NEGATIVE Final    Comment: (NOTE) SARS-CoV-2 target nucleic acids are NOT DETECTED.  The SARS-CoV-2 RNA is generally detectable in upper respiratory specimens during the acute phase of infection. The lowest concentration of SARS-CoV-2 viral copies this assay can detect is 138 copies/mL. A negative result does not preclude SARS-Cov-2 infection and should  not be used as the sole basis for treatment or other patient management decisions. A negative result may occur with  improper specimen collection/handling, submission of specimen other than nasopharyngeal swab, presence of viral mutation(s) within the areas targeted by this assay, and inadequate number of viral copies(<138 copies/mL). A negative result must be combined with clinical observations, patient history, and epidemiological information. The expected result is Negative.  Fact Sheet for Patients:  EntrepreneurPulse.com.au  Fact Sheet for Healthcare Providers:  IncredibleEmployment.be  This test is no t yet approved or cleared by the [place] FDA and  has been authorized for detection and/or diagnosis of SARS-CoV-2 by FDA under an Emergency Use Authorization (EUA). This EUA will remain  in effect (meaning this test can be used) for  the duration of the COVID-19 declaration under Section 564(b)(1) of the Act, 21 U.S.C.section 360bbb-3(b)(1), unless the authorization is terminated  or revoked sooner.       Influenza A by PCR NEGATIVE NEGATIVE Final   Influenza B by PCR NEGATIVE NEGATIVE Final    Comment: (NOTE) The Xpert Xpress SARS-CoV-2/FLU/RSV plus assay is intended as an aid in the diagnosis of influenza from Nasopharyngeal swab specimens and should not be used as a sole basis for treatment. Nasal washings and aspirates are unacceptable for Xpert Xpress SARS-CoV-2/FLU/RSV testing.  Fact Sheet for Patients: EntrepreneurPulse.com.au  Fact Sheet for Healthcare Providers: IncredibleEmployment.be  This test is not yet approved or cleared by the [place] FDA and has been authorized for detection and/or diagnosis of SARS-CoV-2 by FDA under an Emergency Use Authorization (EUA). This EUA will remain in effect (meaning this test can be used) for the duration of the COVID-19 declaration under Section 564(b)(1) of the Act, 21 U.S.C. section 360bbb-3(b)(1), unless the authorization is terminated or revoked.  Performed at [facility], [address], [place], [place] [address]   Urine culture     Status: Abnormal   Collection Time: 09/17/20 11:24 AM   Specimen: Urine, Catheterized  Result Value Ref Range Status   Specimen Description   Final    URINE, CATHETERIZED Performed at [facility], [address], [place], [place] [address]    Special Requests   Final    NONE Performed at [facility], [address] [place], [place] [address]    Culture (A)  Final    70,000 COLONIES/mL ESCHERICHIA COLI Confirmed Extended Spectrum Beta-Lactamase Producer (ESBL).  In bloodstream infections from ESBL organisms, carbapenems are preferred over piperacillin/tazobactam. They are shown to have a lower risk of mortality.    Report Status 09/19/2020  FINAL  Final   Organism ID, Bacteria ESCHERICHIA COLI (A)  Final      Susceptibility   Escherichia coli - MIC*    AMPICILLIN >=32 RESISTANT Resistant     CEFAZOLIN >=64 RESISTANT Resistant     CEFEPIME 16 RESISTANT Resistant     CEFTRIAXONE >=64 RESISTANT Resistant     CIPROFLOXACIN >=4 RESISTANT Resistant     GENTAMICIN 2 SENSITIVE Sensitive     IMIPENEM <=0.25 SENSITIVE Sensitive     NITROFURANTOIN <=16 SENSITIVE Sensitive     TRIMETH/SULFA <=20 SENSITIVE Sensitive     AMPICILLIN/SULBACTAM >=32 RESISTANT Resistant     PIP/TAZO 64 INTERMEDIATE Intermediate     * 70,000 COLONIES/mL ESCHERICHIA COLI  Culture, blood (routine x 2)     Status: None (Preliminary result)   Collection Time: 09/17/20  2:31 PM   Specimen: BLOOD  Result Value Ref Range Status   Specimen  Description BLOOD BLOOD LEFT WRIST  Final   Special Requests   Final    BOTTLES DRAWN AEROBIC AND ANAEROBIC Blood Culture adequate volume   Culture   Final    NO GROWTH 2 DAYS Performed at [facility], [address] [place], [place] [address]    Report Status PENDING  Incomplete  MRSA PCR Screening     Status: None   Collection Time: 09/17/20  4:26 PM   Specimen: Nasopharyngeal  Result Value Ref Range Status   MRSA by PCR NEGATIVE NEGATIVE Final    Comment:        The GeneXpert MRSA Assay (FDA approved for NASAL specimens only), is one component of a comprehensive MRSA colonization surveillance program. It is not intended to diagnose MRSA infection nor to guide or monitor treatment for MRSA infections. Performed at [facility], [address] [place], [place] [address]   Culture, blood (routine x 2)     Status: None (Preliminary result)   Collection Time: 09/18/20  2:50 PM   Specimen: BLOOD  Result Value Ref Range Status   Specimen Description BLOOD BLOOD RIGHT ARM  Final   Special Requests   Final    BOTTLES DRAWN AEROBIC AND ANAEROBIC Blood Culture adequate volume   Culture   Final     NO GROWTH < 24 HOURS Performed at [facility], [address] [place], [place] [address]    Report Status PENDING  Incomplete    Coagulation Studies: Recent Labs    09/17/20 1124  LABPROT 14.5  INR 1.2    Urinalysis: Recent Labs    09/17/20 1124  COLORURINE YELLOW*  LABSPEC 1.011  PHURINE 7.0  GLUCOSEU NEGATIVE  HGBUR NEGATIVE  BILIRUBINUR NEGATIVE  KETONESUR NEGATIVE  PROTEINUR >=300*  NITRITE NEGATIVE  LEUKOCYTESUR NEGATIVE      Imaging: CT Head Wo Contrast  Result Date: 09/17/2020 CLINICAL DATA:  Altered mental status. Found unresponsive. EXAM: CT HEAD WITHOUT CONTRAST TECHNIQUE: Contiguous axial images were obtained from the base of the skull through the vertex without intravenous contrast. COMPARISON:  Most recent head CT 06/11/2020 FINDINGS: Brain: No intracranial hemorrhage, mass effect, or midline shift. No hydrocephalus. The basilar cisterns are patent. No evidence of territorial infarct or acute ischemia. No extra-axial or intracranial fluid collection. Vascular: Atherosclerosis of skullbase vasculature without hyperdense vessel or abnormal calcification. Skull: No fracture or focal lesion. Sinuses/Orbits: Paranasal sinuses and mastoid air cells are clear. The visualized orbits are unremarkable. Bilateral cataract resection. Other: None. IMPRESSION: No acute intracranial abnormality. Electronically Signed   By: Keith Rake M.D.   On: 09/17/2020 15:51     Medications:   . sodium chloride    . sodium chloride    . sodium chloride    . cefTAZidime (FORTAZ)  IV    . dextrose 75 mL/hr at 09/19/20 0528  . norepinephrine (LEVOPHED) Adult infusion 2 mcg/min (09/18/20 0344)  . vancomycin     . Chlorhexidine Gluconate Cloth  6 each Topical Q0600  . feeding supplement (NEPRO CARB STEADY)  237 mL Oral BID BM  . heparin  5,000 Units Subcutaneous Q8H  . multivitamin  1 tablet Oral QHS  . sodium chloride flush  3 mL Intravenous Q12H   sodium  chloride, sodium chloride, sodium chloride, acetaminophen, alteplase, diphenhydrAMINE, docusate sodium, heparin, heparin, HYDROcodone-acetaminophen, lidocaine (PF), lidocaine-prilocaine, morphine injection, pentafluoroprop-tetrafluoroeth, polyethylene glycol, promethazine, sodium chloride flush  Assessment/ Plan:  59 y.o. female with past medical history of ESRD on HD MWF disease,  secondary, chronic diastolic heart failure, diabetes mellitus type 2, thoracic discitis who presents now with hypothermia, hypoglycemia, hypotension, bradycardia.  1.  ESRD on HD TTS.  Patient had hemodialysis yesterday.  It appears that she normally dialyzes on TTS as an outpatient.  We will plan for dialysis again tomorrow as an outpatient.  2.  Hypotension/bradycardia/hypoglycemia.  Much improved as compared to admission.  3.  Anemia of chronic kidney disease.  Patient to resume Epogen as an outpatient.  4.  Secondary hyperparathyroidism.  Phosphorus at target at 4.0.  Continue to monitor bone mineral metabolism parameters as an outpatient.    LOS: 2 Jeanne Terrance 12/29/20211:45 PM

## 2020-09-19 NOTE — Progress Notes (Signed)
When I walked in, pt's chicken salad for lunch was sitting beside her and I asked her if she ate any and she states she had a few bites but didn't want to eat anymore. I explained that we were stopping her D10 soon and that it was important for her to keep eating at meal times and possibly in between so her blood sugar didn't bottom out. Pt states she understands but "I don't feel like eating anymore lunch. I'm done." Offered any other snacks to patient and she denied wanting anymore at this time.

## 2020-09-19 NOTE — Progress Notes (Signed)
Dr. Tawanna Solo is discharging patient. Notified  Case mngr.

## 2020-09-19 NOTE — Progress Notes (Signed)
Pt given snack for blood sugar of 76.

## 2020-09-19 NOTE — Progress Notes (Signed)
Pharmacy Antibiotic Note  [name] is a 59 y.o. female admitted on 09/17/2020 with bradycardia and hypoglycemia. Patient is ESRD on HD on Tuesday,Thursday, Saturday schedule. Patient is being treated as outpatient for thoracic osteomyelitis with disciitis. Antimicrobial regimen includes vancomycin 1 g MWF with HD + ceftazidime 1 g MWF with HD. Therapy is scheduled to continue until 10/10/20. Patient missed HD 12/27. Dialysis 12/28 per nephrology and then resume MWF HD per outpatient regimen. Pharmacy has been consulted for vancomycin dosing. Patient has also been re-started on outpatient ceftazidime.   Correction: Per nephrology -- patient was recently changed to Tues-Thurs-Sat outpatient HD schedule  Plan: Update Ceftazidime 1 g T-Th-Sat with HD Update Vancomycin schedule to prior outpatient regimen of vancomycin 1 g T-Th-Sa with HD  Height: 5\' 5"  (165.1 cm) Weight: 83 kg (182 lb 15.7 oz) IBW/kg (Calculated) : 57  Temp (24hrs), Avg:98.9 F (37.2 C), Min:97.7 F (36.5 C), Max:99.9 F (37.7 C)  Recent Labs  Lab 09/17/20 1124 09/17/20 1718 09/17/20 1731 09/18/20 1506  WBC 4.0  --   --  5.4  CREATININE 4.84*  --   --  5.56*  5.60*  LATICACIDVEN 0.7  --  2.1*  --   VANCORANDOM  --  23  --   --     Estimated Creatinine Clearance: 11.2 mL/min (A) (by C-G formula based on SCr of 5.6 mg/dL (H)).    Allergies  Allergen Reactions  . Ace Inhibitors Swelling and Anaphylaxis  . Ativan [Lorazepam] Other (See Comments)    Reaction:Hallucinations and headaches  . Compazine [Prochlorperazine Edisylate] Anaphylaxis, Nausea And Vomiting and Other (See Comments)    Other reaction(s): dystonia from this vs. Reglan, 23 Jul - patient relates that she takes promethazine frequently with no problems  . Sumatriptan Succinate Other (See Comments)    Other reaction(s): delirium and hallucinations per [facility] records  . Zofran [Ondansetron] Nausea And Vomiting    Per pt. she is allergic to zofran or  will experience adverse reaction like hallucination   . Losartan Nausea Only  . Prochlorperazine Other (See Comments)    Reaction:  Unknown . Patient does not remember reaction but she does have vertigo and anxiety along with n and v at times. Could be used to treat any of these   . Reglan [Metoclopramide] Other (See Comments)    Per patient her Dr. Evelina Bucy her off it   . Scopolamine Other (See Comments)    Dizziness, also has vertigo already  . Tape Rash    Plastic tape causes rash  . Tapentadol Rash    Antimicrobials this admission: Zosyn 12/27 x 1 Cefepime 12/27 >> 12/28 Vancomycin 12/27 >>  Ceftazidime 12/29 >>  Dose adjustments this admission: N/A  Microbiology results: 12/27 UCx: ESBL E. coli 12/27 BCx: NGTD 12/27 MRSA PCR: (-)  Thank you for allowing pharmacy to be a part of this patient's care.  Dorothe Pea, PharmD, BCPS Clinical Pharmacist  09/19/2020 2:04 PM

## 2020-09-19 NOTE — Progress Notes (Signed)
PROGRESS NOTE    [name]  MPN:[id number] DOB: [birth year] DOA: 09/17/2020 [facility]CP: [facility]   Chief Complain: Altered mental status  Brief Narrative: Patient is a 59 year old female with history of ESRD on dialysis TTS, diastolic congestive heart failure, coronary artery disease, laryngeal cancer, pancreatitis, hypertension, hyperlipidemia COPD, asthma, depression who was brought to the emergency department after she was found to be unresponsive at home.  She was brought to the emergency department where she was found to be bradycardic, hypoglycemic.  Chest x-ray was concerning for pulmonary edema.  Cardiology was consulted for bradycardia but her bradycardia has resolved after stopping beta-blockers.  She was [facility] to [facility] service on 09/19/2020.  Nephrology following for dialysis.  Assessment & Plan:   Active Problems:   Bradycardia   Unresponsive/altered mental status: Currently mental status has improved.  Her unresponsive episode could be associated with hypoglycemia/polypharmacy.  She was also bradycardic.  We will continue to monitor mental status.  CT head did not show any acute intracranial normalities.  TSH is normal.   Bradycardia: Looks like resolved.  Cardiology was consulted.  She was on carvedilol which has been stopped.  Cardiology signed off.  We will not start her on any beta-blockers in the future.  Suspected sepsis: Patient has history of thoracic vertebral osteomyelitis which was diagnosed on previous admission and is on vancomycin and ceftazidime to be given during dialysis until 10/11/2019.  We will continue current antibiotics.  ESRD on hemodialysis: Dialyzed on TTS schedule.  Nephrology following.  She looked  volume overloaded on presentation.  Chest x-ray showed interstitial edema.  Elevated BNP.  Volume management as per dialysis.  Hypoglycemia: She was hypoglycemic on presentation and had to be started on D10.  We will continue to  monitor her blood sugars.  We will monitor her off D10. Etiology is unclear.  She takes insulin at home as needed .Her HbA1c is just 4.1 I have recommended to limit the insulin as much as possible and take frequent snacks are sugar tablets and check blood sugars closely at home.  Anemia of chronic disease: Continue Epogen as an outpatient.  History of renal cell carcinoma: Follow-up with oncology as an outpatient.         DVT prophylaxis: Heparin subcu Code Status: Full code Family Communication: Discussed with son at bedside Status is: Inpatient  Remains inpatient appropriate because:Inpatient level of care appropriate due to severity of illness   Dispo: The patient is from: Home              Anticipated d/c is to: Home              Anticipated d/c date is: 1 day              Patient currently is not medically stable to d/c.    Consultants: Nephrology, PCCM  Procedures: None  Antimicrobials:  Anti-infectives (From admission, onward)   Start     Dose/Rate Route Frequency Ordered Stop   09/20/20 1200  vancomycin (VANCOCIN) IVPB 1000 mg/200 mL premix        1,000 mg 200 mL/hr over 60 Minutes Intravenous Every T-Th-Sa (Hemodialysis) 09/19/20 1401     09/20/20 1200  cefTAZidime (FORTAZ) 1 g in sodium chloride 0.9 % 100 mL IVPB        1 g 200 mL/hr over 30 Minutes Intravenous Every T-Th-Sa (Hemodialysis) 09/19/20 1401     09/19/20 1200  vancomycin (VANCOCIN) IVPB 1000 mg/200 mL premix  Status:  Discontinued        1,000 mg 200 mL/hr over 60 Minutes Intravenous Every M-W-F (Hemodialysis) 09/17/20 1712 09/17/20 1717   09/19/20 1200  cefTAZidime (FORTAZ) 1 g in sodium chloride 0.9 % 100 mL IVPB  Status:  Discontinued        1 g 200 mL/hr over 30 Minutes Intravenous Every M-W-F (Hemodialysis) 09/18/20 1202 09/19/20 1401   09/19/20 1200  vancomycin (VANCOCIN) IVPB 1000 mg/200 mL premix  Status:  Discontinued        1,000 mg 200 mL/hr over 60 Minutes Intravenous Every M-W-F  (Hemodialysis) 09/18/20 1202 09/19/20 1401   09/18/20 1349  vancomycin (VANCOREADY) IVPB 500 mg/100 mL        500 mg 100 mL/hr over 60 Minutes Intravenous As directed 09/18/20 1350 09/19/20 0207   09/17/20 1800  vancomycin (VANCOREADY) IVPB 1500 mg/300 mL  Status:  Discontinued        1,500 mg 150 mL/hr over 120 Minutes Intravenous  Once 09/17/20 1712 09/17/20 1717   09/17/20 1800  ceFEPIme (MAXIPIME) 2 g in sodium chloride 0.9 % 100 mL IVPB  Status:  Discontinued        2 g 200 mL/hr over 30 Minutes Intravenous Every 12 hours 09/17/20 1712 09/18/20 1144   09/17/20 1718  vancomycin variable dose per unstable renal function (pharmacist dosing)  Status:  Discontinued         Does not apply See admin instructions 09/17/20 1718 09/18/20 1203   09/17/20 1230  piperacillin-tazobactam (ZOSYN) IVPB 3.375 g        3.375 g 100 mL/hr over 30 Minutes Intravenous  Once 09/17/20 1215 09/17/20 1325      Subjective:  Patient seen and examined at the bedside this afternoon.  She looked comfortable during my evaluation.  She was sitting at the edge of the bed.  She is alert and oriented.  Her bradycardia has resolved and her sugars are more stable.  Objective: Vitals:   09/19/20 0039 09/19/20 0517 09/19/20 0745 09/19/20 1125  BP: (!) 105/48 (!) 141/67 (!) 137/55 (!) 155/66  Pulse: 60 64 63 66  Resp: 18 20 17 17   Temp: 98.5 F (36.9 C) 99.3 F (37.4 C) 99.1 F (37.3 C) 98.8 F (37.1 C)  TempSrc: Oral     SpO2: 100% 92%  100%  Weight:      Height:        Intake/Output Summary (Last 24 hours) at 09/19/2020 1423 Last data filed at 09/19/2020 0528 Gross per 24 hour  Intake 1582.18 ml  Output 398 ml  Net 1184.18 ml   Filed Weights   09/17/20 1117  Weight: 83 kg    Examination:  General exam: comfortable Respiratory system: Bilateral basilar  crackles  Cardiovascular system: S1 & S2 heard, RRR. No JVD, murmurs, rubs, gallops or clicks. No pedal edema.  Dialysis catheter on the left  chest Gastrointestinal system: Abdomen is nondistended, soft and nontender. No organomegaly or masses felt. Normal bowel sounds heard. Central nervous system: Alert and oriented. No focal neurological deficits. Extremities: Bilateral lower extremity edema, no clubbing ,no cyanosis Skin: No rashes, lesions or ulcers,no icterus ,no pallor -  Data Reviewed: I have personally reviewed following labs and imaging studies  CBC: Recent Labs  Lab 09/17/20 1124 09/18/20 1506  WBC 4.0 5.4  NEUTROABS 1.0*  --   HGB 9.1* 8.3*  HCT 27.9* 25.5*  MCV 88.0 86.4  PLT 144* 149*   Basic Metabolic Panel: Recent Labs  Lab  09/17/20 1124 09/17/20 1345 09/17/20 1811 09/18/20 1506  NA 140  --   --  134*  133*  K 3.4*  --   --  4.3  4.3  CL 101  --   --  97*  97*  CO2 31  --   --  27  27  GLUCOSE 24*  --  76 86  87  BUN 15  --   --  19  19  CREATININE 4.84*  --   --  5.56*  5.60*  CALCIUM 12.0*  --   --  8.9  8.8*  MG  --  2.3  --  2.2  PHOS  --  3.7  --  4.0  3.9   GFR: Estimated Creatinine Clearance: 11.2 mL/min (A) (by C-G formula based on SCr of 5.6 mg/dL (H)). Liver Function Tests: Recent Labs  Lab 09/17/20 1124 09/18/20 1506  AST 14*  --   ALT 12  --   ALKPHOS 62  --   BILITOT 0.5  --   PROT 6.2*  --   ALBUMIN 2.6* 2.4*   No results for input(s): LIPASE, AMYLASE in the last 168 hours. Recent Labs  Lab 09/17/20 1115  AMMONIA 21   Coagulation Profile: Recent Labs  Lab 09/17/20 1124  INR 1.2   Cardiac Enzymes: No results for input(s): CKTOTAL, CKMB, CKMBINDEX, TROPONINI in the last 168 hours. BNP (last 3 results) No results for input(s): PROBNP in the last 8760 hours. HbA1C: Recent Labs    09/18/20 1506  HGBA1C 4.1*   CBG: Recent Labs  Lab 09/18/20 1643 09/18/20 2126 09/19/20 0750 09/19/20 1126 09/19/20 1210  GLUCAP 82 81 93 88 110*   Lipid Profile: No results for input(s): CHOL, HDL, LDLCALC, TRIG, CHOLHDL, LDLDIRECT in the last 72  hours. Thyroid Function Tests: Recent Labs    09/17/20 1124  TSH 1.437   Anemia Panel: No results for input(s): VITAMINB12, FOLATE, FERRITIN, TIBC, IRON, RETICCTPCT in the last 72 hours. Sepsis Labs: Recent Labs  Lab 09/17/20 1124 09/17/20 1731  LATICACIDVEN 0.7 2.1*    Recent Results (from the past 240 hour(s))  Resp Panel by RT-PCR (Flu A&B, Covid) Nasopharyngeal Swab     Status: None   Collection Time: 09/17/20 11:24 AM   Specimen: Nasopharyngeal Swab; Nasopharyngeal(NP) swabs in vial transport medium  Result Value Ref Range Status   SARS Coronavirus 2 by RT PCR NEGATIVE NEGATIVE Final    Comment: (NOTE) SARS-CoV-2 target nucleic acids are NOT DETECTED.  The SARS-CoV-2 RNA is generally detectable in upper respiratory specimens during the acute phase of infection. The lowest concentration of SARS-CoV-2 viral copies this assay can detect is 138 copies/mL. A negative result does not preclude SARS-Cov-2 infection and should not be used as the sole basis for treatment or other patient management decisions. A negative result may occur with  improper specimen collection/handling, submission of specimen other than nasopharyngeal swab, presence of viral mutation(s) within the areas targeted by this assay, and inadequate number of viral copies(<138 copies/mL). A negative result must be combined with clinical observations, patient history, and epidemiological information. The expected result is Negative.  Fact Sheet for Patients:  EntrepreneurPulse.com.au  Fact Sheet for Healthcare Providers:  IncredibleEmployment.be  This test is no t yet approved or cleared by the [place] FDA and  has been authorized for detection and/or diagnosis of SARS-CoV-2 by FDA under an Emergency Use Authorization (EUA). This EUA will remain  in effect (meaning this test can be used) for  the duration of the COVID-19 declaration under Section 564(b)(1) of the  Act, 21 U.S.C.section 360bbb-3(b)(1), unless the authorization is terminated  or revoked sooner.       Influenza A by PCR NEGATIVE NEGATIVE Final   Influenza B by PCR NEGATIVE NEGATIVE Final    Comment: (NOTE) The Xpert Xpress SARS-CoV-2/FLU/RSV plus assay is intended as an aid in the diagnosis of influenza from Nasopharyngeal swab specimens and should not be used as a sole basis for treatment. Nasal washings and aspirates are unacceptable for Xpert Xpress SARS-CoV-2/FLU/RSV testing.  Fact Sheet for Patients: EntrepreneurPulse.com.au  Fact Sheet for Healthcare Providers: IncredibleEmployment.be  This test is not yet approved or cleared by the [place] FDA and has been authorized for detection and/or diagnosis of SARS-CoV-2 by FDA under an Emergency Use Authorization (EUA). This EUA will remain in effect (meaning this test can be used) for the duration of the COVID-19 declaration under Section 564(b)(1) of the Act, 21 U.S.C. section 360bbb-3(b)(1), unless the authorization is terminated or revoked.  Performed at [facility], [address], [place], [place] [address]   Urine culture     Status: Abnormal   Collection Time: 09/17/20 11:24 AM   Specimen: Urine, Catheterized  Result Value Ref Range Status   Specimen Description   Final    URINE, CATHETERIZED Performed at [facility], [address], [place], [place] [address]    Special Requests   Final    NONE Performed at [facility], [address] [place], [place] [address]    Culture (A)  Final    70,000 COLONIES/mL ESCHERICHIA COLI Confirmed Extended Spectrum Beta-Lactamase Producer (ESBL).  In bloodstream infections from ESBL organisms, carbapenems are preferred over piperacillin/tazobactam. They are shown to have a lower risk of mortality.    Report Status 09/19/2020 FINAL  Final   Organism ID, Bacteria ESCHERICHIA COLI (A)  Final       Susceptibility   Escherichia coli - MIC*    AMPICILLIN >=32 RESISTANT Resistant     CEFAZOLIN >=64 RESISTANT Resistant     CEFEPIME 16 RESISTANT Resistant     CEFTRIAXONE >=64 RESISTANT Resistant     CIPROFLOXACIN >=4 RESISTANT Resistant     GENTAMICIN 2 SENSITIVE Sensitive     IMIPENEM <=0.25 SENSITIVE Sensitive     NITROFURANTOIN <=16 SENSITIVE Sensitive     TRIMETH/SULFA <=20 SENSITIVE Sensitive     AMPICILLIN/SULBACTAM >=32 RESISTANT Resistant     PIP/TAZO 64 INTERMEDIATE Intermediate     * 70,000 COLONIES/mL ESCHERICHIA COLI  Culture, blood (routine x 2)     Status: None (Preliminary result)   Collection Time: 09/17/20  2:31 PM   Specimen: BLOOD  Result Value Ref Range Status   Specimen Description BLOOD BLOOD LEFT WRIST  Final   Special Requests   Final    BOTTLES DRAWN AEROBIC AND ANAEROBIC Blood Culture adequate volume   Culture   Final    NO GROWTH 2 DAYS Performed at [facility], [address] [place], [place] [address]    Report Status PENDING  Incomplete  MRSA PCR Screening     Status: None   Collection Time: 09/17/20  4:26 PM   Specimen: Nasopharyngeal  Result Value Ref Range Status   MRSA by PCR NEGATIVE NEGATIVE Final    Comment:        The GeneXpert MRSA Assay (FDA approved for NASAL specimens only), is one component of a comprehensive MRSA colonization surveillance program. It is not intended to  diagnose MRSA infection nor to guide or monitor treatment for MRSA infections. Performed at [facility], [address] [place], [place] [address]   Culture, blood (routine x 2)     Status: None (Preliminary result)   Collection Time: 09/18/20  2:50 PM   Specimen: BLOOD  Result Value Ref Range Status   Specimen Description BLOOD BLOOD RIGHT ARM  Final   Special Requests   Final    BOTTLES DRAWN AEROBIC AND ANAEROBIC Blood Culture adequate volume   Culture   Final    NO GROWTH < 24 HOURS Performed at [facility], [address] [place],  [address]    Report Status PENDING  Incomplete         Radiology Studies: CT Head Wo Contrast  Result Date: 09/17/2020 CLINICAL DATA:  Altered mental status. Found unresponsive. EXAM: CT HEAD WITHOUT CONTRAST TECHNIQUE: Contiguous axial images were obtained from the base of the skull through the vertex without intravenous contrast. COMPARISON:  Most recent head CT 06/11/2020 FINDINGS: Brain: No intracranial hemorrhage, mass effect, or midline shift. No hydrocephalus. The basilar cisterns are patent. No evidence of territorial infarct or acute ischemia. No extra-axial or intracranial fluid collection. Vascular: Atherosclerosis of skullbase vasculature without hyperdense vessel or abnormal calcification. Skull: No fracture or focal lesion. Sinuses/Orbits: Paranasal sinuses and mastoid air cells are clear. The visualized orbits are unremarkable. Bilateral cataract resection. Other: None. IMPRESSION: No acute intracranial abnormality. Electronically Signed   By: Keith Rake M.D.   On: 09/17/2020 15:51        Scheduled Meds: . Chlorhexidine Gluconate Cloth  6 each Topical Q0600  . feeding supplement (NEPRO CARB STEADY)  237 mL Oral BID BM  . heparin  5,000 Units Subcutaneous Q8H  . multivitamin  1 tablet Oral QHS  . sodium chloride flush  3 mL Intravenous Q12H   Continuous Infusions: . sodium chloride    . [START ON 09/20/2020] cefTAZidime (FORTAZ)  IV    . dextrose 75 mL/hr at 09/19/20 0528  . norepinephrine (LEVOPHED) Adult infusion 2 mcg/min (09/18/20 0344)  . [START ON 09/20/2020] vancomycin       LOS: 2 days    Time spent: 35 mins,More than 50% of that time was spent in counseling and/or coordination of care.      Shelly Coss, MD [facility] P12/29/2021, 2:23 PM

## 2020-09-19 NOTE — Progress Notes (Signed)
D10 stopped per order. Will get a blood sugar in 2 hours per Dr. Tawanna Solo.

## 2020-09-19 NOTE — Progress Notes (Signed)
[facility] Cardiology    SUBJECTIVE: Ms. Hornung is a 59 year old female with a past medical history significant for HFpEF, carotid atherosclerosis, aortic atherosclerosis, hypertension, ESRD on HD MWF, COPD, and anemia who presented to the ED on 09/17/20 for altered mental status.  She was noted to be hypoglycemic, glucose of 24, creatinine of 4.84, BNP of 620, high sensitivity troponin negative x 2, temperature of 90 degrees F, chest xray revealing small bilateral pleural effusions and diffuse bilateral airspace disease, and ECG revealing sinus bradycardia, rate in the low 40s.  Home dose of carvedilol was held.   She is followed in outpatient cardiology by Dr. Nehemiah Massed. Transesophageal echocardiogram on 06/11/20 revealed low normal LV systolic function with an EF estimated between 50-55% with normal RV systolic function; mild to moderate MR, mild to moderate TR. Stress test in 2018 revealed no evidence of ischemia.   09/19/20: Lying in bed, in no acute distress. Denies chest pain, palpitations, shortness of breath, dizziness, lightheadedness, or syncopal/presyncopal episodes.    Vitals:   09/18/20 1900 09/18/20 2051 09/19/20 0039 09/19/20 0517  BP: 138/70 (!) 119/48 (!) 105/48 (!) 141/67  Pulse: 69 64 60 64  Resp: 11 20 18 20   Temp:  99.9 F (37.7 C) 98.5 F (36.9 C) 99.3 F (37.4 C)  TempSrc:   Oral   SpO2:  100% 100% 92%  Weight:      Height:         Intake/Output Summary (Last 24 hours) at 09/19/2020 0745 Last data filed at 09/19/2020 6144 Gross per 24 hour  Intake 1690.84 ml  Output 398 ml  Net 1292.84 ml      PHYSICAL EXAM  General: Well developed, well nourished, in no acute distress HEENT:  Normocephalic and atramatic Neck:  No JVD.  Lungs: Clear bilaterally to auscultation and percussion. Heart: HRRR . Normal S1 and S2 without gallops or murmurs.  Abdomen: Bowel sounds are positive, abdomen soft and non-tender  Msk:  Back normal.  Normal strength and tone for  age. Extremities: No clubbing, cyanosis or edema.   Neuro: Alert and oriented X 3. Psych:  Good affect, responds appropriately   LABS: Basic Metabolic Panel: Recent Labs    09/17/20 1124 09/17/20 1345 09/17/20 1811 09/18/20 1506  NA 140  --   --  134*  133*  K 3.4*  --   --  4.3  4.3  CL 101  --   --  97*  97*  CO2 31  --   --  27  27  GLUCOSE 24*  --  76 86  87  BUN 15  --   --  19  19  CREATININE 4.84*  --   --  5.56*  5.60*  CALCIUM 12.0*  --   --  8.9  8.8*  MG  --  2.3  --  2.2  PHOS  --  3.7  --  4.0  3.9   Liver Function Tests: Recent Labs    09/17/20 1124 09/18/20 1506  AST 14*  --   ALT 12  --   ALKPHOS 62  --   BILITOT 0.5  --   PROT 6.2*  --   ALBUMIN 2.6* 2.4*   No results for input(s): LIPASE, AMYLASE in the last 72 hours. CBC: Recent Labs    09/17/20 1124 09/18/20 1506  WBC 4.0 5.4  NEUTROABS 1.0*  --   HGB 9.1* 8.3*  HCT 27.9* 25.5*  MCV 88.0 86.4  PLT 144* 102*  Cardiac Enzymes: No results for input(s): CKTOTAL, CKMB, CKMBINDEX, TROPONINI in the last 72 hours. BNP: Invalid input(s): POCBNP D-Dimer: No results for input(s): DDIMER in the last 72 hours. Hemoglobin A1C: Recent Labs    09/18/20 1506  HGBA1C 4.1*   Fasting Lipid Panel: No results for input(s): CHOL, HDL, LDLCALC, TRIG, CHOLHDL, LDLDIRECT in the last 72 hours. Thyroid Function Tests: Recent Labs    09/17/20 1124  TSH 1.437   Anemia Panel: No results for input(s): VITAMINB12, FOLATE, FERRITIN, TIBC, IRON, RETICCTPCT in the last 72 hours.  DG Chest 1 View  Result Date: 09/17/2020 CLINICAL DATA:  Altered mental status EXAM: CHEST  1 VIEW COMPARISON:  08/10/2020 FINDINGS: Mild cardiac enlargement. Diffuse bilateral airspace disease is symmetric and has progressed in the interval. Small bilateral pleural effusions. Dual lumen central venous catheter tip in the right atrium unchanged. Left subclavian vascular stent unchanged. IMPRESSION: Progression of diffuse  bilateral airspace disease most likely edema. Small bilateral effusions. Electronically Signed   By: Franchot Gallo M.D.   On: 09/17/2020 11:36   CT Head Wo Contrast  Result Date: 09/17/2020 CLINICAL DATA:  Altered mental status. Found unresponsive. EXAM: CT HEAD WITHOUT CONTRAST TECHNIQUE: Contiguous axial images were obtained from the base of the skull through the vertex without intravenous contrast. COMPARISON:  Most recent head CT 06/11/2020 FINDINGS: Brain: No intracranial hemorrhage, mass effect, or midline shift. No hydrocephalus. The basilar cisterns are patent. No evidence of territorial infarct or acute ischemia. No extra-axial or intracranial fluid collection. Vascular: Atherosclerosis of skullbase vasculature without hyperdense vessel or abnormal calcification. Skull: No fracture or focal lesion. Sinuses/Orbits: Paranasal sinuses and mastoid air cells are clear. The visualized orbits are unremarkable. Bilateral cataract resection. Other: None. IMPRESSION: No acute intracranial abnormality. Electronically Signed   By: Keith Rake M.D.   On: 09/17/2020 15:51     TELEMETRY: Normal sinus rhythm   ASSESSMENT AND PLAN:  Active Problems:   Bradycardia    1.  Bradycardia   -Resolved; rate now in the 60s; would remain off of carvedilol or other BB/CCBs at this time   -No further workup indicated from a cardiac standpoint; will continue to follow as needed   2.  Hypothermia   -Improved   3.  Hypotension   -No longer requiring pressors   4.  ESRD on HD  -Nephrology following; dialysis treatment yesterday   The history, physical exam findings, and plan of care were all discussed with Dr. Bartholome Bill, and all decision making was made in collaboration.   Avie Arenas  PA-C 09/19/2020 7:45 AM

## 2020-09-20 ENCOUNTER — Ambulatory Visit: Payer: Medicare HMO | Admitting: Oncology

## 2020-09-20 ENCOUNTER — Inpatient Hospital Stay: Payer: Medicare HMO

## 2020-09-20 DIAGNOSIS — R001 Bradycardia, unspecified: Secondary | ICD-10-CM | POA: Diagnosis not present

## 2020-09-20 LAB — BASIC METABOLIC PANEL
Anion gap: 10 (ref 5–15)
BUN: 16 mg/dL (ref 8–23)
CO2: 27 mmol/L (ref 22–32)
Calcium: 8.2 mg/dL — ABNORMAL LOW (ref 8.9–10.3)
Chloride: 92 mmol/L — ABNORMAL LOW (ref 98–111)
Creatinine, Ser: 4.79 mg/dL — ABNORMAL HIGH (ref 0.44–1.00)
GFR, Estimated: 10 mL/min — ABNORMAL LOW (ref >60)
Glucose, Bld: 90 mg/dL (ref 70–99)
Potassium: 4.2 mmol/L (ref 3.5–5.1)
Sodium: 129 mmol/L — ABNORMAL LOW (ref 135–145)

## 2020-09-20 LAB — CBC WITH DIFFERENTIAL/PLATELET
Abs Immature Granulocytes: 0.01 10*3/uL (ref 0.00–0.07)
Basophils Absolute: 0 10*3/uL (ref 0.0–0.1)
Basophils Relative: 0 %
Eosinophils Absolute: 1.6 10*3/uL — ABNORMAL HIGH (ref 0.0–0.5)
Eosinophils Relative: 32 %
HCT: 25.6 % — ABNORMAL LOW (ref 36.0–46.0)
Hemoglobin: 8.4 g/dL — ABNORMAL LOW (ref 12.0–15.0)
Immature Granulocytes: 0 %
Lymphocytes Relative: 27 %
Lymphs Abs: 1.3 10*3/uL (ref 0.7–4.0)
MCH: 28.3 pg (ref 26.0–34.0)
MCHC: 32.8 g/dL (ref 30.0–36.0)
MCV: 86.2 fL (ref 80.0–100.0)
Monocytes Absolute: 0.4 10*3/uL (ref 0.1–1.0)
Monocytes Relative: 7 %
Neutro Abs: 1.6 10*3/uL — ABNORMAL LOW (ref 1.7–7.7)
Neutrophils Relative %: 34 %
Platelets: 94 10*3/uL — ABNORMAL LOW (ref 150–400)
RBC: 2.97 MIL/uL — ABNORMAL LOW (ref 3.87–5.11)
RDW: 17.2 % — ABNORMAL HIGH (ref 11.5–15.5)
WBC: 4.9 10*3/uL (ref 4.0–10.5)
nRBC: 0 % (ref 0.0–0.2)

## 2020-09-20 LAB — GLUCOSE, CAPILLARY
Glucose-Capillary: 81 mg/dL (ref 70–99)
Glucose-Capillary: 71 mg/dL (ref 70–99)
Glucose-Capillary: 91 mg/dL (ref 70–99)

## 2020-09-20 MED ORDER — DIPHENHYDRAMINE HCL 25 MG PO CAPS
25.0000 mg | ORAL_CAPSULE | Freq: Four times a day (QID) | ORAL | Status: DC | PRN
  Administered 2020-09-20: 22:00:00 25 mg via ORAL
  Filled 2020-09-20: qty 1

## 2020-09-20 MED ORDER — HYDROCODONE-ACETAMINOPHEN 5-325 MG PO TABS
1.0000 | ORAL_TABLET | Freq: Four times a day (QID) | ORAL | Status: DC | PRN
  Administered 2020-09-20 (×2): 1 via ORAL
  Filled 2020-09-20 (×2): qty 1

## 2020-09-20 NOTE — Progress Notes (Signed)
Assessed pt. For IV start for the  second time with RN at bedside, using Ultrasound . Found small veins on right forearm, but pt. Declined to be stuck on the said areas. Pt. Had  Preferences where to start IV but unfortunately , no veins can be found by this IV vast Nurse upon assessment t w/ ultrasound. RN aware . Will notify MD. Pt.'s left arm swollen and pt. Declined to be stuck on said arm. AC is also notified of earlier situation.

## 2020-09-20 NOTE — Progress Notes (Signed)
PROGRESS NOTE    [name]  YKZ:[id number] DOB: 12-02-1960 DOA: 09/17/2020 [facility]: [facility]   Chief Complain: Altered mental status  Brief Narrative: Patient is a 59 year old female with history of ESRD on dialysis TTS, diastolic congestive heart failure, coronary artery disease, laryngeal cancer, pancreatitis, hypertension, hyperlipidemia COPD, asthma, depression who was brought to the emergency department after she was found to be unresponsive at home.  She was brought to the emergency department where she was found to be bradycardic, hypoglycemic.  Chest x-ray was concerning for pulmonary edema.  Cardiology was consulted for bradycardia but her bradycardia has resolved after stopping beta-blockers.  She was transferred to [facility] service on 09/19/2020.  Nephrology following for dialysis.  Assessment & Plan:   Active Problems:   Bradycardia   Unresponsive/altered mental status: Currently mental status is at baseline.  Her unresponsive episode could be associated with hypoglycemia/polypharmacy.  She was also bradycardic.  We will continue to monitor mental status.  CT head did not show any acute intracranial normalities.  TSH is normal.   Bradycardia: Looks like resolved.  Cardiology was consulted.  She was on carvedilol which has been stopped.  Cardiology signed off.  We will not start her on any beta-blockers in the future.  Vertebral osteomyelitis : Patient has history of thoracic vertebral osteomyelitis which was diagnosed on previous admission and is on vancomycin and ceftazidime to be given during dialysis until 10/11/2019.  We will continue current antibiotics.  ESRD on hemodialysis: Dialyzed on TTS schedule.  Nephrology following.  She looked  volume overloaded on presentation.  Chest x-ray showed interstitial edema.  Elevated BNP.  Volume management as per dialysis.Getting dialysed today  Hypoglycemia: She was hypoglycemic on presentation and had to be  started on D10.  We will continue to monitor her blood sugars.  We will monitor her off D10. Etiology is unclear.  She takes insulin at home as needed .Her HbA1c is just 4.1 I have recommended to limit the insulin as much as possible and take frequent snacks are sugar tablets and check blood sugars closely at home.  Anemia of chronic disease: Continue Epogen as an outpatient.  History of renal cell carcinoma: Follow-up with oncology as an outpatient.  Left upper extremity edema: Most likely secondary to IV infiltration.  Will check venous Doppler         DVT prophylaxis: Heparin subcu Code Status: Full code Family Communication: Discussed with son on phone Status is: Inpatient  Remains inpatient appropriate because:Inpatient level of care appropriate due to severity of illness   Dispo: The patient is from: Home              Anticipated d/c is to: Home              Anticipated d/c date is: 1 day              Patient currently is not medically stable to d/c.    Consultants: Nephrology, PCCM  Procedures: None  Antimicrobials:  Anti-infectives (From admission, onward)   Start     Dose/Rate Route Frequency Ordered Stop   09/20/20 1200  vancomycin (VANCOCIN) IVPB 1000 mg/200 mL premix        1,000 mg 200 mL/hr over 60 Minutes Intravenous Every T-Th-Sa (Hemodialysis) 09/19/20 1401     09/20/20 1200  cefTAZidime (FORTAZ) 1 g in sodium chloride 0.9 % 100 mL IVPB        1 g 200 mL/hr over 30 Minutes Intravenous  Every T-Th-Sa (Hemodialysis) 09/19/20 1401     09/19/20 1200  vancomycin (VANCOCIN) IVPB 1000 mg/200 mL premix  Status:  Discontinued        1,000 mg 200 mL/hr over 60 Minutes Intravenous Every M-W-F (Hemodialysis) 09/17/20 1712 09/17/20 1717   09/19/20 1200  cefTAZidime (FORTAZ) 1 g in sodium chloride 0.9 % 100 mL IVPB  Status:  Discontinued        1 g 200 mL/hr over 30 Minutes Intravenous Every M-W-F (Hemodialysis) 09/18/20 1202 09/19/20 1401   09/19/20 1200  vancomycin  (VANCOCIN) IVPB 1000 mg/200 mL premix  Status:  Discontinued        1,000 mg 200 mL/hr over 60 Minutes Intravenous Every M-W-F (Hemodialysis) 09/18/20 1202 09/19/20 1401   09/18/20 1349  vancomycin (VANCOREADY) IVPB 500 mg/100 mL        500 mg 100 mL/hr over 60 Minutes Intravenous As directed 09/18/20 1350 09/19/20 0207   09/17/20 1800  vancomycin (VANCOREADY) IVPB 1500 mg/300 mL  Status:  Discontinued        1,500 mg 150 mL/hr over 120 Minutes Intravenous  Once 09/17/20 1712 09/17/20 1717   09/17/20 1800  ceFEPIme (MAXIPIME) 2 g in sodium chloride 0.9 % 100 mL IVPB  Status:  Discontinued        2 g 200 mL/hr over 30 Minutes Intravenous Every 12 hours 09/17/20 1712 09/18/20 1144   09/17/20 1718  vancomycin variable dose per unstable renal function (pharmacist dosing)  Status:  Discontinued         Does not apply See admin instructions 09/17/20 1718 09/18/20 1203   09/17/20 1230  piperacillin-tazobactam (ZOSYN) IVPB 3.375 g        3.375 g 100 mL/hr over 30 Minutes Intravenous  Once 09/17/20 1215 09/17/20 1325      Subjective: Patient seen and examined the bedside this morning.  Hemodynamically stable.  She looks more comfortable today was not short of breath.  Complains of some back pain.  Alert and oriented  Objective: Vitals:   09/19/20 2011 09/19/20 2356 09/20/20 0443 09/20/20 0531  BP: (!) 144/63 (!) 135/55 (!) 128/55   Pulse: 63 (!) 59 (!) 58   Resp: 16 15 16    Temp: 98 F (36.7 C) 97.7 F (36.5 C) 97.9 F (36.6 C)   TempSrc:  Oral Oral   SpO2: 100% 100% 100%   Weight:    86 kg  Height:       No intake or output data in the 24 hours ending 09/20/20 0743 Filed Weights   09/17/20 1117 09/20/20 0531  Weight: 83 kg 86 kg    Examination:  General exam: comfortable Respiratory system: Bilateral diminished air sounds on the bases cardiovascular system: S1 & S2 heard, RRR. No JVD, murmurs, rubs, gallops or clicks.  Dialysis catheter on the left chest Gastrointestinal  system: Abdomen is nondistended, soft and nontender. No organomegaly or masses felt. Normal bowel sounds heard. Central nervous system: Alert and oriented. No focal neurological deficits. Extremities: Lower extremity edema, edema of the left upper extremity, no clubbing ,no cyanosis Skin: No rashes, lesions or ulcers,no icterus ,no pallor  Data Reviewed: I have personally reviewed following labs and imaging studies  CBC: Recent Labs  Lab 09/17/20 1124 09/18/20 1506  WBC 4.0 5.4  NEUTROABS 1.0*  --   HGB 9.1* 8.3*  HCT 27.9* 25.5*  MCV 88.0 86.4  PLT 144* 580*   Basic Metabolic Panel: Recent Labs  Lab 09/17/20 1124 09/17/20 1345 09/17/20 1811 09/18/20  1506  NA 140  --   --  134*  133*  K 3.4*  --   --  4.3  4.3  CL 101  --   --  97*  97*  CO2 31  --   --  27  27  GLUCOSE 24*  --  76 86  87  BUN 15  --   --  19  19  CREATININE 4.84*  --   --  5.56*  5.60*  CALCIUM 12.0*  --   --  8.9  8.8*  MG  --  2.3  --  2.2  PHOS  --  3.7  --  4.0  3.9   GFR: Estimated Creatinine Clearance: 11.4 mL/min (A) (by C-G formula based on SCr of 5.6 mg/dL (H)). Liver Function Tests: Recent Labs  Lab 09/17/20 1124 09/18/20 1506  AST 14*  --   ALT 12  --   ALKPHOS 62  --   BILITOT 0.5  --   PROT 6.2*  --   ALBUMIN 2.6* 2.4*   No results for input(s): LIPASE, AMYLASE in the last 168 hours. Recent Labs  Lab 09/17/20 1115  AMMONIA 21   Coagulation Profile: Recent Labs  Lab 09/17/20 1124  INR 1.2   Cardiac Enzymes: No results for input(s): CKTOTAL, CKMB, CKMBINDEX, TROPONINI in the last 168 hours. BNP (last 3 results) No results for input(s): PROBNP in the last 8760 hours. HbA1C: Recent Labs    09/18/20 1506  HGBA1C 4.1*   CBG: Recent Labs  Lab 09/19/20 1210 09/19/20 1549 09/19/20 1711 09/19/20 2016 09/20/20 0509  GLUCAP 110* 76 79 88 81   Lipid Profile: No results for input(s): CHOL, HDL, LDLCALC, TRIG, CHOLHDL, LDLDIRECT in the last 72 hours. Thyroid  Function Tests: Recent Labs    09/17/20 1124  TSH 1.437   Anemia Panel: No results for input(s): VITAMINB12, FOLATE, FERRITIN, TIBC, IRON, RETICCTPCT in the last 72 hours. Sepsis Labs: Recent Labs  Lab 09/17/20 1124 09/17/20 1731  LATICACIDVEN 0.7 2.1*    Recent Results (from the past 240 hour(s))  Resp Panel by RT-PCR (Flu A&B, Covid) Nasopharyngeal Swab     Status: None   Collection Time: 09/17/20 11:24 AM   Specimen: Nasopharyngeal Swab; Nasopharyngeal(NP) swabs in vial transport medium  Result Value Ref Range Status   SARS Coronavirus 2 by RT PCR NEGATIVE NEGATIVE Final    Comment: (NOTE) SARS-CoV-2 target nucleic acids are NOT DETECTED.  The SARS-CoV-2 RNA is generally detectable in upper respiratory specimens during the acute phase of infection. The lowest concentration of SARS-CoV-2 viral copies this assay can detect is 138 copies/mL. A negative result does not preclude SARS-Cov-2 infection and should not be used as the sole basis for treatment or other patient management decisions. A negative result may occur with  improper specimen collection/handling, submission of specimen other than nasopharyngeal swab, presence of viral mutation(s) within the areas targeted by this assay, and inadequate number of viral copies(<138 copies/mL). A negative result must be combined with clinical observations, patient history, and epidemiological information. The expected result is Negative.  Fact Sheet for Patients:  EntrepreneurPulse.com.au  Fact Sheet for Healthcare Providers:  IncredibleEmployment.be  This test is no t yet approved or cleared by the [place] FDA and  has been authorized for detection and/or diagnosis of SARS-CoV-2 by FDA under an Emergency Use Authorization (EUA). This EUA will remain  in effect (meaning this test can be used) for the duration of the COVID-19 declaration under  Section 564(b)(1) of the Act,  21 U.S.C.section 360bbb-3(b)(1), unless the authorization is terminated  or revoked sooner.       Influenza A by PCR NEGATIVE NEGATIVE Final   Influenza B by PCR NEGATIVE NEGATIVE Final    Comment: (NOTE) The Xpert Xpress SARS-CoV-2/FLU/RSV plus assay is intended as an aid in the diagnosis of influenza from Nasopharyngeal swab specimens and should not be used as a sole basis for treatment. Nasal washings and aspirates are unacceptable for Xpert Xpress SARS-CoV-2/FLU/RSV testing.  Fact Sheet for Patients: EntrepreneurPulse.com.au  Fact Sheet for Healthcare Providers: IncredibleEmployment.be  This test is not yet approved or cleared by the [place] FDA and has been authorized for detection and/or diagnosis of SARS-CoV-2 by FDA under an Emergency Use Authorization (EUA). This EUA will remain in effect (meaning this test can be used) for the duration of the COVID-19 declaration under Section 564(b)(1) of the Act, 21 U.S.C. section 360bbb-3(b)(1), unless the authorization is terminated or revoked.  Performed at [facility], [address], [place], [place] [address]   Urine culture     Status: Abnormal   Collection Time: 09/17/20 11:24 AM   Specimen: Urine, Catheterized  Result Value Ref Range Status   Specimen Description   Final    URINE, CATHETERIZED Performed at [facility], [address], [place], [place] [address]    Special Requests   Final    NONE Performed at [facility], [address] [place],  [address]    Culture (A)  Final    70,000 COLONIES/mL ESCHERICHIA COLI Confirmed Extended Spectrum Beta-Lactamase Producer (ESBL).  In bloodstream infections from ESBL organisms, carbapenems are preferred over piperacillin/tazobactam. They are shown to have a lower risk of mortality.    Report Status 09/19/2020 FINAL  Final   Organism ID, Bacteria ESCHERICHIA COLI (A)  Final       Susceptibility   Escherichia coli - MIC*    AMPICILLIN >=32 RESISTANT Resistant     CEFAZOLIN >=64 RESISTANT Resistant     CEFEPIME 16 RESISTANT Resistant     CEFTRIAXONE >=64 RESISTANT Resistant     CIPROFLOXACIN >=4 RESISTANT Resistant     GENTAMICIN 2 SENSITIVE Sensitive     IMIPENEM <=0.25 SENSITIVE Sensitive     NITROFURANTOIN <=16 SENSITIVE Sensitive     TRIMETH/SULFA <=20 SENSITIVE Sensitive     AMPICILLIN/SULBACTAM >=32 RESISTANT Resistant     PIP/TAZO 64 INTERMEDIATE Intermediate     * 70,000 COLONIES/mL ESCHERICHIA COLI  Culture, blood (routine x 2)     Status: None (Preliminary result)   Collection Time: 09/17/20  2:31 PM   Specimen: BLOOD  Result Value Ref Range Status   Specimen Description BLOOD BLOOD LEFT WRIST  Final   Special Requests   Final    BOTTLES DRAWN AEROBIC AND ANAEROBIC Blood Culture adequate volume   Culture   Final    NO GROWTH 3 DAYS Performed at [facility], [address], [place],  [address]    Report Status PENDING  Incomplete  MRSA PCR Screening     Status: None   Collection Time: 09/17/20  4:26 PM   Specimen: Nasopharyngeal  Result Value Ref Range Status   MRSA by PCR NEGATIVE NEGATIVE Final    Comment:        The GeneXpert MRSA Assay (FDA approved for NASAL specimens only), is one component of a comprehensive MRSA colonization surveillance program. It is not intended to diagnose MRSA infection nor to guide or  monitor treatment for MRSA infections. Performed at [facility], [address] [place], [place] [address]   Culture, blood (routine x 2)     Status: None (Preliminary result)   Collection Time: 09/18/20  2:50 PM   Specimen: BLOOD  Result Value Ref Range Status   Specimen Description BLOOD BLOOD RIGHT ARM  Final   Special Requests   Final    BOTTLES DRAWN AEROBIC AND ANAEROBIC Blood Culture adequate volume   Culture   Final    NO GROWTH 2 DAYS Performed at [facility], [address], [place], [place] [address]    Report Status PENDING  Incomplete         Radiology Studies: No results found.      Scheduled Meds: . amLODipine  10 mg Oral Daily  . aspirin EC  81 mg Oral Daily  . atorvastatin  20 mg Oral QPM  . carvedilol  6.25 mg Oral BID WC  . Chlorhexidine Gluconate Cloth  6 each Topical Q0600  . gabapentin  100 mg Oral QPM  . heparin  5,000 Units Subcutaneous Q8H  . hydrALAZINE  50 mg Oral Q8H  . lipase/protease/amylase  36,000 Units Oral TID AC  . multivitamin  1 tablet Oral QHS  . sevelamer carbonate  800 mg Oral TID WC  . sodium chloride flush  3 mL Intravenous Q12H   Continuous Infusions: . sodium chloride    . cefTAZidime (FORTAZ)  IV    . dextrose 50 mL/hr at 09/19/20 1645  . vancomycin       LOS: 3 days    Time spent: 35 mins,More than 50% of that time was spent in counseling and/or coordination of care.      Shelly Coss, MD [facility] P12/30/2021, 7:43 AM

## 2020-09-20 NOTE — Progress Notes (Addendum)
[place] [facility]) Hospital Liaison RN note:  This patient is a pending palliative and will be followed by out patient based palliative care with Manufacturing engineer. We will follow for disposition. Meagan Hagwood, TOC is aware.   Thank you.  Loney Laurence [facility] Liaison [phone]

## 2020-09-20 NOTE — Progress Notes (Signed)
[facility]  ROUNDING NOTE   Subjective:  Patient due for dialysis treatment today. Resting comfortably at the moment.   Objective:  Vital signs in last 24 hours:  Temp:  [97.7 F (36.5 C)-98.8 F (37.1 C)] 98.8 F (37.1 C) (12/30 1400) Pulse Rate:  [57-67] 67 (12/30 1400) Resp:  [11-22] 14 (12/30 1400) BP: (128-161)/(52-87) 161/62 (12/30 1400) SpO2:  [91 %-100 %] 100 % (12/30 0823) Weight:  [86 kg] 86 kg (12/30 0531)  Weight change:  Filed Weights   09/17/20 1117 09/20/20 0531  Weight: 83 kg 86 kg    Intake/Output: I/O last 3 completed shifts: In: 1592.5 [I.V.:1498.4; IV Piggyback:94] Out: 0    Intake/Output this shift:  No intake/output data recorded.  Physical Exam: General:  No acute distress  Head:  Normocephalic, atraumatic. Moist oral mucosal membranes  Eyes:  Anicteric  Neck:  Supple  Lungs:   Clear to auscultation, normal effort  Heart:  S1S2 no rubs  Abdomen:   Soft, nontender, bowel sounds present  Extremities:  1+ peripheral edema.  Neurologic:  Awake, alert, following commands  Skin:  Warm/dry  Access:  PermCath in place    Basic Metabolic Panel: Recent Labs  Lab 09/17/20 1124 09/17/20 1345 09/17/20 1811 09/18/20 1506 09/20/20 1015  NA 140  --   --  134*  133* 129*  K 3.4*  --   --  4.3  4.3 4.2  CL 101  --   --  97*  97* 92*  CO2 31  --   --  27  27 27   GLUCOSE 24*  --  76 86  87 90  BUN 15  --   --  19  19 16   CREATININE 4.84*  --   --  5.56*  5.60* 4.79*  CALCIUM 12.0*  --   --  8.9  8.8* 8.2*  MG  --  2.3  --  2.2  --   PHOS  --  3.7  --  4.0  3.9  --     Liver Function Tests: Recent Labs  Lab 09/17/20 1124 09/18/20 1506  AST 14*  --   ALT 12  --   ALKPHOS 62  --   BILITOT 0.5  --   PROT 6.2*  --   ALBUMIN 2.6* 2.4*   No results for input(s): LIPASE, AMYLASE in the last 168 hours. Recent Labs  Lab 09/17/20 1115  AMMONIA 21    CBC: Recent Labs  Lab 09/17/20 1124 09/18/20 1506  09/20/20 1015  WBC 4.0 5.4 4.9  NEUTROABS 1.0*  --  1.6*  HGB 9.1* 8.3* 8.4*  HCT 27.9* 25.5* 25.6*  MCV 88.0 86.4 86.2  PLT 144* 102* 94*    Cardiac Enzymes: No results for input(s): CKTOTAL, CKMB, CKMBINDEX, TROPONINI in the last 168 hours.  BNP: Invalid input(s): POCBNP  CBG: Recent Labs  Lab 09/19/20 1549 09/19/20 1711 09/19/20 2016 09/20/20 0509 09/20/20 0825  GLUCAP 96 78 93 81 01    Microbiology: Results for orders placed or performed during the hospital encounter of 09/17/20  Resp Panel by RT-PCR (Flu A&B, Covid) Nasopharyngeal Swab     Status: None   Collection Time: 09/17/20 11:24 AM   Specimen: Nasopharyngeal Swab; Nasopharyngeal(NP) swabs in vial transport medium  Result Value Ref Range Status   SARS Coronavirus 2 by RT PCR NEGATIVE NEGATIVE Final    Comment: (NOTE) SARS-CoV-2 target nucleic acids are NOT DETECTED.  The SARS-CoV-2 RNA is generally detectable in upper  respiratory specimens during the acute phase of infection. The lowest concentration of SARS-CoV-2 viral copies this assay can detect is 138 copies/mL. A negative result does not preclude SARS-Cov-2 infection and should not be used as the sole basis for treatment or other patient management decisions. A negative result may occur with  improper specimen collection/handling, submission of specimen other than nasopharyngeal swab, presence of viral mutation(s) within the areas targeted by this assay, and inadequate number of viral copies(<138 copies/mL). A negative result must be combined with clinical observations, patient history, and epidemiological information. The expected result is Negative.  Fact Sheet for Patients:  EntrepreneurPulse.com.au  Fact Sheet for Healthcare Providers:  IncredibleEmployment.be  This test is no t yet approved or cleared by the [place] FDA and  has been authorized for detection and/or diagnosis of SARS-CoV-2 by FDA  under an Emergency Use Authorization (EUA). This EUA will remain  in effect (meaning this test can be used) for the duration of the COVID-19 declaration under Section 564(b)(1) of the Act, 21 U.S.C.section 360bbb-3(b)(1), unless the authorization is terminated  or revoked sooner.       Influenza A by PCR NEGATIVE NEGATIVE Final   Influenza B by PCR NEGATIVE NEGATIVE Final    Comment: (NOTE) The Xpert Xpress SARS-CoV-2/FLU/RSV plus assay is intended as an aid in the diagnosis of influenza from Nasopharyngeal swab specimens and should not be used as a sole basis for treatment. Nasal washings and aspirates are unacceptable for Xpert Xpress SARS-CoV-2/FLU/RSV testing.  Fact Sheet for Patients: EntrepreneurPulse.com.au  Fact Sheet for Healthcare Providers: IncredibleEmployment.be  This test is not yet approved or cleared by the [place] FDA and has been authorized for detection and/or diagnosis of SARS-CoV-2 by FDA under an Emergency Use Authorization (EUA). This EUA will remain in effect (meaning this test can be used) for the duration of the COVID-19 declaration under Section 564(b)(1) of the Act, 21 U.S.C. section 360bbb-3(b)(1), unless the authorization is terminated or revoked.  Performed at [facility], [address], [place], [place] [address]   Urine culture     Status: Abnormal   Collection Time: 09/17/20 11:24 AM   Specimen: Urine, Catheterized  Result Value Ref Range Status   Specimen Description   Final    URINE, CATHETERIZED Performed at [facility], [address], [place], [place] [address]    Special Requests   Final    NONE Performed at [facility], [address] [place], [place] [address]    Culture (A)  Final    70,000 COLONIES/mL ESCHERICHIA COLI Confirmed Extended Spectrum Beta-Lactamase Producer (ESBL).  In bloodstream infections from ESBL organisms, carbapenems are preferred  over piperacillin/tazobactam. They are shown to have a lower risk of mortality.    Report Status 09/19/2020 FINAL  Final   Organism ID, Bacteria ESCHERICHIA COLI (A)  Final      Susceptibility   Escherichia coli - MIC*    AMPICILLIN >=32 RESISTANT Resistant     CEFAZOLIN >=64 RESISTANT Resistant     CEFEPIME 16 RESISTANT Resistant     CEFTRIAXONE >=64 RESISTANT Resistant     CIPROFLOXACIN >=4 RESISTANT Resistant     GENTAMICIN 2 SENSITIVE Sensitive     IMIPENEM <=0.25 SENSITIVE Sensitive     NITROFURANTOIN <=16 SENSITIVE Sensitive     TRIMETH/SULFA <=20 SENSITIVE Sensitive     AMPICILLIN/SULBACTAM >=32 RESISTANT Resistant     PIP/TAZO 64 INTERMEDIATE Intermediate     * 70,000 COLONIES/mL ESCHERICHIA COLI  Culture, blood (  routine x 2)     Status: None (Preliminary result)   Collection Time: 09/17/20  2:31 PM   Specimen: BLOOD  Result Value Ref Range Status   Specimen Description BLOOD BLOOD LEFT WRIST  Final   Special Requests   Final    BOTTLES DRAWN AEROBIC AND ANAEROBIC Blood Culture adequate volume   Culture   Final    NO GROWTH 3 DAYS Performed at [facility], [address] [place], [place] [address]    Report Status PENDING  Incomplete  MRSA PCR Screening     Status: None   Collection Time: 09/17/20  4:26 PM   Specimen: Nasopharyngeal  Result Value Ref Range Status   MRSA by PCR NEGATIVE NEGATIVE Final    Comment:        The GeneXpert MRSA Assay (FDA approved for NASAL specimens only), is one component of a comprehensive MRSA colonization surveillance program. It is not intended to diagnose MRSA infection nor to guide or monitor treatment for MRSA infections. Performed at [facility], [address] [place], [place] [address]   Culture, blood (routine x 2)     Status: None (Preliminary result)   Collection Time: 09/18/20  2:50 PM   Specimen: BLOOD  Result Value Ref Range Status   Specimen Description BLOOD BLOOD RIGHT ARM  Final    Special Requests   Final    BOTTLES DRAWN AEROBIC AND ANAEROBIC Blood Culture adequate volume   Culture   Final    NO GROWTH 2 DAYS Performed at [facility], [address] [place], [place] [address]    Report Status PENDING  Incomplete    Coagulation Studies: No results for input(s): LABPROT, INR in the last 72 hours.  Urinalysis: No results for input(s): COLORURINE, LABSPEC, PHURINE, GLUCOSEU, HGBUR, BILIRUBINUR, KETONESUR, PROTEINUR, UROBILINOGEN, NITRITE, LEUKOCYTESUR in the last 72 hours.  Invalid input(s): APPERANCEUR    Imaging: No results found.   Medications:   . sodium chloride    . cefTAZidime (FORTAZ)  IV    . dextrose 50 mL/hr at 09/19/20 1645  . vancomycin Stopped (09/20/20 1501)   . amLODipine  10 mg Oral Daily  . aspirin EC  81 mg Oral Daily  . atorvastatin  20 mg Oral QPM  . Chlorhexidine Gluconate Cloth  6 each Topical Q0600  . gabapentin  100 mg Oral QPM  . heparin  5,000 Units Subcutaneous Q8H  . hydrALAZINE  50 mg Oral Q8H  . lipase/protease/amylase  36,000 Units Oral TID AC  . multivitamin  1 tablet Oral QHS  . sevelamer carbonate  800 mg Oral TID WC  . sodium chloride flush  3 mL Intravenous Q12H   sodium chloride, acetaminophen, dextrose, docusate sodium, heparin, HYDROcodone-acetaminophen, morphine injection, polyethylene glycol, promethazine, sevelamer carbonate, sodium chloride flush  Assessment/ Plan:  59 y.o. female with past medical history of ESRD on HD MWF disease, secondary, chronic diastolic heart failure, diabetes mellitus type 2, thoracic discitis who presents now with hypothermia, hypoglycemia, hypotension, bradycardia.  1.  ESRD on HD TTS.  Patient due for hemodialysis treatment today.  Orders have been prepared.  2.  Hypotension/bradycardia/hypoglycemia.  Now resolved.  3.  Anemia of chronic kidney disease.  Hemoglobin 8.4 at last check.  Resume Epogen as an outpatient.  4.  Secondary hyperparathyroidism.  Patient  maintained on Renvela 800 mg p.o. 3 times daily with meals for phosphorus control..    LOS: 3 [name] 12/30/20213:35 PM

## 2020-09-20 NOTE — Evaluation (Signed)
Physical Therapy Evaluation Patient Details Name: SHONIQUE PELPHREY MRN: [id number] DOB: October 15, 1960 Today's Date: 09/20/2020   History of Present Illness  Patient is a 59 year old female with history of ESRD on dialysis TTS, diastolic congestive heart failure, coronary artery disease, laryngeal cancer, pancreatitis, hypertension, hyperlipidemia COPD, asthma, depression who was brought to the emergency department after she was found to be unresponsive at home. Found to have bradycardia, suspected sepsis.  Clinical Impression  PT evaluation completed. At baseline patient reports she lives alone and is Mod I with mobility using rolling walker for limited community mobility and uses furniture in the home for support to walk. Patient currently is Mod I for bed mobility for extra time required. No physical assistance needed. Patient demonstrated good dynamic sitting balance without UE support. No dizziness reported during activity, Sp02 99% on 2 L02 and heart rate 64bpm. No shortness of breath noted, however patient does appear generally fatigued with activity. Patient declined to attempt ambulating at this time due to wanting to eat breakfast before dialysis. Anticipate patient will be able to discharge home with HHPT and intermittent support from family. Recommend to continue PT to maximize independence and address functional limitations listed below.     Follow Up Recommendations Home health PT;Supervision - Intermittent    Equipment Recommendations  None recommended by PT    Recommendations for Other Services       Precautions / Restrictions Precautions Precautions: Fall Restrictions Weight Bearing Restrictions: No      Mobility  Bed Mobility Overal bed mobility: Modified Independent Bed Mobility: Supine to Sit     Supine to sit: Modified independent (Device/Increase time);HOB elevated     General bed mobility comments: no phyiscal assistance required for bed mobility. patient required  increased time to complete tasks.    Transfers                 General transfer comment: patient declined  Ambulation/Gait             General Gait Details: patient declined walking now due to wanting to eat breakfast before dialysis. patient asked therapist to return later today to tey and walk to the door  Stairs            Wheelchair Mobility    Modified Rankin (Stroke Patients Only)       Balance   Sitting-balance support: Feet supported Sitting balance-Leahy Scale: Good Sitting balance - Comments: normal sitting balance. patient able to reach outside base of support without difficulty                                     Pertinent Vitals/Pain Pain Assessment: No/denies pain    Home Living Family/patient expects to be discharged to:: Private residence Living Arrangements: Alone Available Help at Discharge: Family;Available PRN/intermittently Type of Home: Apartment Home Access: Level entry     Home Layout: One level Home Equipment: Walker - 4 wheels;Cane - single point;Cane - quad;Wheelchair - manual;Toilet riser;Walker - 2 wheels Additional Comments: patient reports that she drives herself to dialysis    Prior Function Level of Independence: Independent with assistive device(s)         Comments: Mod I with limited ambulation. patient reports she ambulates in the house using furniture for support but uses walker when going for longer distances     Hand Dominance   Dominant Hand: Left    Extremity/Trunk Assessment  Upper Extremity Assessment Upper Extremity Assessment: Overall WFL for tasks assessed (mild swelling noted LUE)    Lower Extremity Assessment Lower Extremity Assessment: Generalized weakness       Communication   Communication: No difficulties  Cognition Arousal/Alertness: Awake/alert Behavior During Therapy: WFL for tasks assessed/performed Overall Cognitive Status: Within Functional Limits for tasks  assessed                                        General Comments      Exercises     Assessment/Plan    PT Assessment Patient needs continued PT services  PT Problem List Decreased strength;Decreased activity tolerance;Decreased mobility       PT Treatment Interventions DME instruction;Gait training;Functional mobility training;Therapeutic activities;Therapeutic exercise;Balance training;Stair training    PT Goals (Current goals can be found in the Care Plan section)  Acute Rehab PT Goals Patient Stated Goal: to go home PT Goal Formulation: With patient Time For Goal Achievement: 10/04/20 Potential to Achieve Goals: Good    Frequency Min 2X/week   Barriers to discharge        Co-evaluation               AM-PAC PT "6 Clicks" Mobility  Outcome Measure Help needed turning from your back to your side while in a flat bed without using bedrails?: None Help needed moving from lying on your back to sitting on the side of a flat bed without using bedrails?: None Help needed moving to and from a bed to a chair (including a wheelchair)?: A Little Help needed standing up from a chair using your arms (e.g., wheelchair or bedside chair)?: A Little Help needed to walk in hospital room?: A Little Help needed climbing 3-5 steps with a railing? : A Little 6 Click Score: 20    End of Session Equipment Utilized During Treatment: Oxygen Activity Tolerance: Patient tolerated treatment well Patient left:  (sitting up on edge of bed eating breakfast) Nurse Communication: Mobility status PT Visit Diagnosis: Muscle weakness (generalized) (M62.81);History of falling (Z91.81)    Time: 9371-6967 PT Time Calculation (min) (ACUTE ONLY): 27 min   Charges:   PT Evaluation $PT Eval Low Complexity: 1 Low PT Treatments $Therapeutic Activity: 8-22 mins        Minna Merritts, PT, MPT  Percell Locus 09/20/2020, 11:31 AM

## 2020-09-21 DIAGNOSIS — R001 Bradycardia, unspecified: Secondary | ICD-10-CM | POA: Diagnosis not present

## 2020-09-21 DIAGNOSIS — N186 End stage renal disease: Secondary | ICD-10-CM | POA: Diagnosis not present

## 2020-09-21 DIAGNOSIS — Z992 Dependence on renal dialysis: Secondary | ICD-10-CM | POA: Diagnosis not present

## 2020-09-21 LAB — GLUCOSE, CAPILLARY
Glucose-Capillary: 113 mg/dL — ABNORMAL HIGH (ref 70–99)
Glucose-Capillary: 115 mg/dL — ABNORMAL HIGH (ref 70–99)
Glucose-Capillary: 150 mg/dL — ABNORMAL HIGH (ref 70–99)
Glucose-Capillary: 66 mg/dL — ABNORMAL LOW (ref 70–99)
Glucose-Capillary: 75 mg/dL (ref 70–99)
Glucose-Capillary: 85 mg/dL (ref 70–99)

## 2020-09-21 MED ORDER — ONDANSETRON HCL 4 MG PO TABS: 4.0000 mg | ORAL_TABLET | Freq: Three times a day (TID) | ORAL | 1 refills | Status: DC | PRN

## 2020-09-21 MED ORDER — PROMETHAZINE HCL 25 MG/ML IJ SOLN: 6.2500 mg | Freq: Four times a day (QID) | INTRAMUSCULAR | Status: DC | PRN

## 2020-09-21 MED ORDER — MEGESTROL ACETATE 400 MG/10ML PO SUSP: 400.0000 mg | Freq: Every day | ORAL | 1 refills | Status: DC

## 2020-09-21 MED ORDER — PROMETHAZINE HCL 25 MG PO TABS
12.5000 mg | ORAL_TABLET | Freq: Four times a day (QID) | ORAL | Status: DC | PRN
  Filled 2020-09-21: qty 1

## 2020-09-21 NOTE — Progress Notes (Signed)
I told CNA to go recheck her blood sugar and CNA came back and said pt refused. I went in and spoke to patient and told her the need based on her morning low bs, to get it rechecked. Pt still refuses. Doctor notified.

## 2020-09-21 NOTE — Progress Notes (Signed)
Had to go back in room to bring back supplies and I saw that the patient had stopped eating and started looking at her phone. I asked the patient if everything was ok with her tray and why she had stopped and she did not answer me. I reminded patient of importance of eating with her blood sugar going down. Pt states, "I know!" and I left the room.   I came back after 20 minutes and pt was still on the phone, her breakfast untouched and had not eaten anything else. I asked her why she hadn't eaten anythign and while she was still looking at her phone she did not answer again. I called the son as she had given me permission prior to call him for any needs. Explained what was going on and he said he would call her right then and encourage her to eat.   Pt did eventually eat. Dr. Ree Kida.

## 2020-09-21 NOTE — Discharge Summary (Signed)
Physician Discharge Summary  [name] KBT:[id number] DOB: 10/09/1960 DOA: 09/17/2020  PCP: [facility] [place] date: 09/17/2020 Discharge date: 09/21/2020  Admitted From: Home Disposition:  Home  Discharge Condition:Stable CODE STATUS:FULL Diet recommendation:  Regular   Brief/Interim Summary: Patient is a 59 year old female with history of ESRD on dialysis TTS, diastolic congestive heart failure, coronary artery disease, laryngeal cancer, pancreatitis, hypertension, hyperlipidemia COPD, asthma, depression who was brought to the emergency department after she was found to be unresponsive at home.  She was brought to the emergency department where she was found to be bradycardic, hypoglycemic.  Chest x-ray was concerning for pulmonary edema.  Cardiology was consulted for bradycardia but her bradycardia has resolved after stopping beta-blockers.  She was transferred to [facility] service on 09/19/2020.  Nephrology following for dialysis.  Currently she is hemodynamically stable.  Her blood sugars have improved.  Her hypoglycemia is secondary to decreased oral intake.  We have encouraged her to eat more, I started on Megace.  She has been seen by PT and recommended home health on discharge.  She is medically stable for discharge home today.  Following problems were addressed during her hospitalization:  Unresponsive/altered mental status: Currently mental status is at baseline.  Her unresponsive episode could be associated with hypoglycemia/polypharmacy.  She was also bradycardic.  Currently she is alert and oriented  Bradycardia: Looks like resolved.  Cardiology was consulted.  She was on carvedilol which has been stopped.  Cardiology signed off.  We will not start her on any beta-blockers in the future.  Vertebral osteomyelitis : Patient has history of thoracic vertebral osteomyelitis which was diagnosed on previous admission and is on vancomycin and ceftazidime to be given  during dialysis until 10/11/2019.  We will continue current antibiotics.  ESRD on hemodialysis: Dialyzed on TTS schedule.  Nephrology following.  She looked  volume overloaded on presentation.  Chest x-ray showed interstitial edema.  Elevated BNP.  Volume management as per dialysis.  Hypoglycemia: She was hypoglycemic on presentation and had to be started on D10.  Etiology is unclear but this is most likely secondary to decreased oral intake.  She takes insulin at home as needed .Her HbA1c is just 4.1 I have recommended not to take insulin and take frequent snacks are sugar tablets and check blood sugars closely at home.  She has poor appetite.  We have added Megace  Anemia of chronic disease: Continue Epogen as an outpatient.  History of renal cell carcinoma: Follow-up with oncology as an outpatient.  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  Will not initiate any anticoagulation.    Discharge Diagnoses:  Active Problems:   Bradycardia    Discharge Instructions  Discharge Instructions    Diet general   Complete by: As directed    Discharge instructions   Complete by: As directed    1)Please follow up with your PCP in a week 2)We encourage to have increased oral intake at home,dont take insulin 3)Monitors your sugars at home   Increase activity slowly   Complete by: As directed    No wound care   Complete by: As directed      Allergies as of 09/21/2020      Reactions   Ace Inhibitors Swelling, Anaphylaxis   Ativan [lorazepam] Other (See Comments)   Reaction:Hallucinations and headaches   Compazine [prochlorperazine Edisylate] Anaphylaxis, Nausea And Vomiting, Other (  See Comments)   Other reaction(s): dystonia from this vs. Reglan, 23 Jul - patient relates that she takes promethazine frequently with no problems   Sumatriptan Succinate Other (See Comments)   Other  reaction(s): delirium and hallucinations per [facility] records   Zofran [ondansetron] Nausea And Vomiting   Per pt. she is allergic to zofran or will experience adverse reaction like hallucination    Losartan Nausea Only   Prochlorperazine Other (See Comments)   Reaction:  Unknown . Patient does not remember reaction but she does have vertigo and anxiety along with n and v at times. Could be used to treat any of these   Reglan [metoclopramide] Other (See Comments)   Per patient her Dr. Evelina Bucy her off it    Scopolamine Other (See Comments)   Dizziness, also has vertigo already   Tape Rash   Plastic tape causes rash   Tapentadol Rash      Medication List    STOP taking these medications   carvedilol 6.25 MG tablet Commonly known as: COREG     TAKE these medications   albuterol (2.5 MG/3ML) 0.083% nebulizer solution Commonly known as: PROVENTIL Take 2.5 mg by nebulization every 4 (four) hours as needed for wheezing or shortness of breath.   albuterol 108 (90 Base) MCG/ACT inhaler Commonly known as: VENTOLIN HFA Inhale 2 puffs into the lungs every 4 (four) hours as needed for wheezing or shortness of breath.   alum & mag hydroxide-simeth 200-200-20 MG/5ML suspension Commonly known as: MAALOX/MYLANTA Take 15 mLs by mouth every 6 (six) hours as needed for indigestion or heartburn.   amLODipine 10 MG tablet Commonly known as: NORVASC Take 1 tablet (10 mg total) by mouth daily.   ammonium lactate 12 % lotion Commonly known as: LAC-HYDRIN Apply 1 application topically 2 (two) times daily as needed for dry skin.   aspirin EC 81 MG tablet Take 81 mg by mouth daily.   atorvastatin 20 MG tablet Commonly known as: LIPITOR Take 20 mg by mouth every evening.   calcium carbonate 500 MG chewable tablet Commonly known as: TUMS - dosed in mg elemental calcium Chew 2.5 tablets (500 mg of elemental calcium total) by mouth every 6 (six) hours as needed for indigestion.   cefTAZidime   IVPB Commonly known as: FORTAZ Inject 1 g into the vein every Monday, Wednesday, and Friday with hemodialysis. Indication: Thoracic vertebral osteomyelitis with disciits First Dose: Yes Last Day of Therapy:  10/10/20 Labs - Once weekly:  CBC/D, CMP, ESR, vancomycin trough   Method of administration may be changed at the discretion of home infusion pharmacist based upon assessment of the patient and/or caregiver's ability to self-administer the medication ordered. What changed: when to take this   cholestyramine light 4 g packet Commonly known as: PREVALITE Take 4 g by mouth 2 (two) times daily.   Creon 12000-38000 units Cpep capsule Generic drug: lipase/protease/amylase Take 36,000 Units by mouth 3 (three) times daily before meals.   cyanocobalamin 1000 MCG tablet Take 1,000 mcg by mouth daily.   diphenhydrAMINE 25 MG tablet Commonly known as: BENADRYL Take 25 mg by mouth every 6 (six) hours as needed for itching.   epoetin alfa 10000 UNIT/ML injection Commonly known as: EPOGEN Inject 1 mL (10,000 Units total) into the vein every Monday, Wednesday, and Friday with hemodialysis.   feeding supplement Liqd Take 237 mLs by mouth daily.   furosemide 80 MG tablet Commonly known as: LASIX Take 80 mg by mouth daily.   gabapentin  100 MG capsule Commonly known as: NEURONTIN Take 100 mg by mouth every evening.   hydrALAZINE 50 MG tablet Commonly known as: APRESOLINE Take 1 tablet (50 mg total) by mouth every 8 (eight) hours.   HYDROcodone-acetaminophen 5-325 MG tablet Commonly known as: NORCO/VICODIN Take 1 tablet by mouth 3 (three) times daily as needed for moderate pain or severe pain.   hydrOXYzine 25 MG tablet Commonly known as: ATARAX/VISTARIL Take 25 mg by mouth daily.   Iron 325 (65 Fe) MG Tabs Take 325 mg by mouth daily.   megestrol 400 MG/10ML suspension Commonly known as: MEGACE Take 10 mLs (400 mg total) by mouth daily.   multivitamin Tabs tablet Take 1  tablet by mouth daily.   mupirocin ointment 2 % Commonly known as: BACTROBAN Apply 1 application topically daily as needed (leg rash).   nitroGLYCERIN 0.4 MG SL tablet Commonly known as: NITROSTAT Place 0.4 mg under the tongue every 5 (five) minutes as needed for chest pain.   Omega-3 300 MG Caps Take 300 mg by mouth 2 (two) times daily. Every other day opposite omega XL   ondansetron 4 MG tablet Commonly known as: Zofran Take 1 tablet (4 mg total) by mouth every 8 (eight) hours as needed for nausea or vomiting.   pantoprazole 40 MG tablet Commonly known as: PROTONIX Take 40 mg by mouth daily.   polyethylene glycol 17 g packet Commonly known as: MIRALAX / GLYCOLAX Take 17 g by mouth daily.   PROBIOTIC PO Take 1 capsule by mouth daily.   promethazine 25 MG tablet Commonly known as: PHENERGAN Take 1 tablet (25 mg total) by mouth every 6 (six) hours as needed for nausea or vomiting.   sevelamer carbonate 800 MG tablet Commonly known as: RENVELA Take 800 mg by mouth 4 (four) times daily. (at meals and with a snack)   vancomycin  IVPB Inject 1,000 mg into the vein every Monday, Wednesday, and Friday with hemodialysis. Indication:  Thoracic vertebral osteomyelitis with disciits First Dose: Yes Last Day of Therapy:  10/10/20 Labs - Weekly:  CBC/D, CMP, vancomycin trough, ESR Method of administration:Elastomeric Method of administration may be changed at the discretion of the patient and/or caregiver's ability to self-administer the medication ordered. What changed: when to take this   VITA-C PO Take 1 tablet by mouth daily.   Vitamin D3 25 MCG (1000 UT) Caps Take 1,000 Units by mouth daily.            Durable Medical Equipment  (From admission, onward)         Start     Ordered   09/21/20 0913  For home use only DME 3 n 1  Once        09/21/20 0912          Follow-up [place], [facility]. Schedule an appointment as soon as  possible for a visit in 1 week(s).   Specialty: General Practice Contact information: [place] [id number] [phone]              Allergies  Allergen Reactions  . Ace Inhibitors Swelling and Anaphylaxis  . Ativan [Lorazepam] Other (See Comments)    Reaction:Hallucinations and headaches  . Compazine [Prochlorperazine Edisylate] Anaphylaxis, Nausea And Vomiting and Other (See Comments)    Other reaction(s): dystonia from this vs. Reglan, 23 Jul - patient relates that she takes promethazine frequently with no problems  . Sumatriptan Succinate Other (See Comments)  Other reaction(s): delirium and hallucinations per [facility] records  . Zofran [Ondansetron] Nausea And Vomiting    Per pt. she is allergic to zofran or will experience adverse reaction like hallucination   . Losartan Nausea Only  . Prochlorperazine Other (See Comments)    Reaction:  Unknown . Patient does not remember reaction but she does have vertigo and anxiety along with n and v at times. Could be used to treat any of these   . Reglan [Metoclopramide] Other (See Comments)    Per patient her Dr. Evelina Bucy her off it   . Scopolamine Other (See Comments)    Dizziness, also has vertigo already  . Tape Rash    Plastic tape causes rash  . Tapentadol Rash    Consultations:  PCCM   Procedures/Studies: DG Chest 1 View  Result Date: 09/17/2020 CLINICAL DATA:  Altered mental status EXAM: CHEST  1 VIEW COMPARISON:  08/10/2020 FINDINGS: Mild cardiac enlargement. Diffuse bilateral airspace disease is symmetric and has progressed in the interval. Small bilateral pleural effusions. Dual lumen central venous catheter tip in the right atrium unchanged. Left subclavian vascular stent unchanged. IMPRESSION: Progression of diffuse bilateral airspace disease most likely edema. Small bilateral effusions. Electronically Signed   By: Franchot Gallo M.D.   On: 09/17/2020 11:36   CT Head Wo  Contrast  Result Date: 09/17/2020 CLINICAL DATA:  Altered mental status. Found unresponsive. EXAM: CT HEAD WITHOUT CONTRAST TECHNIQUE: Contiguous axial images were obtained from the base of the skull through the vertex without intravenous contrast. COMPARISON:  Most recent head CT 06/11/2020 FINDINGS: Brain: No intracranial hemorrhage, mass effect, or midline shift. No hydrocephalus. The basilar cisterns are patent. No evidence of territorial infarct or acute ischemia. No extra-axial or intracranial fluid collection. Vascular: Atherosclerosis of skullbase vasculature without hyperdense vessel or abnormal calcification. Skull: No fracture or focal lesion. Sinuses/Orbits: Paranasal sinuses and mastoid air cells are clear. The visualized orbits are unremarkable. Bilateral cataract resection. Other: None. IMPRESSION: No acute intracranial abnormality. Electronically Signed   By: Keith Rake M.D.   On: 09/17/2020 15:51   US Venous Img Upper Uni Left (DVT)  Result Date: 09/21/2020 CLINICAL DATA:  Upper extremity edema. History of end-stage renal disease with indwelling tunneled left-sided dialysis catheter. Status post prior stenting of the left subclavian vein and angioplasty of the left brachiocephalic vein. EXAM: LEFT UPPER EXTREMITY VENOUS DOPPLER ULTRASOUND TECHNIQUE: Gray-scale sonography with graded compression, as well as color Doppler and duplex ultrasound were performed to evaluate the upper extremity deep venous system from the level of the subclavian vein and including the jugular, axillary, basilic, radial, ulnar and upper cephalic vein. Spectral Doppler was utilized to evaluate flow at rest and with distal augmentation maneuvers. COMPARISON:  None. FINDINGS: Contralateral Subclavian Vein: Respiratory phasicity is normal and symmetric with the symptomatic side. No evidence of thrombus. Normal compressibility. Internal Jugular Vein: No evidence of thrombus. Normal compressibility, respiratory  phasicity and response to augmentation. Subclavian Vein: Previously stented left subclavian vein is likely chronically occluded. Axillary Vein: No evidence of thrombus. Normal compressibility, respiratory phasicity and response to augmentation. Cephalic Vein: No evidence of thrombus. Normal compressibility, respiratory phasicity and response to augmentation. Basilic Vein: No evidence of thrombus. Normal compressibility, respiratory phasicity and response to augmentation. Brachial Veins: No evidence of thrombus. Normal compressibility, respiratory phasicity and response to augmentation. Radial Veins: Segment of the left radial vein is occluded. Based on small caliber of the vein and echogenic appearance of thrombus, this is felt to be likely  chronic thrombus. Ulnar Veins: No evidence of thrombus. Normal compressibility, respiratory phasicity and response to augmentation. Venous Reflux:  None visualized. Other Findings: No evidence of superficial thrombophlebitis or abnormal fluid collection. IMPRESSION: 1. 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 2. Left radial vein occlusion in the forearm. Based on small caliber of the vein and echogenic appearance of thrombus, this is felt to be likely chronic thrombus. Electronically Signed   By: Aletta Edouard M.D.   On: 09/21/2020 09:23       Subjective: Patient seen and examined at the bedside this morning.  Hemodynamically stable for discharge.  Discharge Exam: Vitals:   09/21/20 0449 09/21/20 0824  BP: (!) 148/65 (!) 148/61  Pulse: 63 61  Resp: 16 18  Temp: 98.3 F (36.8 C) 98.4 F (36.9 C)  SpO2: 100% 100%   Vitals:   09/20/20 1944 09/21/20 0004 09/21/20 0449 09/21/20 0824  BP: (!) 141/62 (!) 129/56 (!) 148/65 (!) 148/61  Pulse: 66 67 63 61  Resp: 16 16 16 18   Temp: 98.1 F (36.7 C) 98.4 F (36.9 C) 98.3 F (36.8 C) 98.4 F (36.9 C)  TempSrc: Oral Oral Oral   SpO2: 96% 91% 100% 100%  Weight:       Height:        General: Pt is alert, awake, not in acute distress Cardiovascular: RRR, S1/S2 +, no rubs, no gallops Respiratory: CTA bilaterally, no wheezing, no rhonchi Abdominal: Soft, NT, ND, bowel sounds + Extremities:trace edema on the extremities, no cyanosis    The results of significant diagnostics from this hospitalization (including imaging, microbiology, ancillary and laboratory) are listed below for reference.     Microbiology: Recent Results (from the past 240 hour(s))  Resp Panel by RT-PCR (Flu A&B, Covid) Nasopharyngeal Swab     Status: None   Collection Time: 09/17/20 11:24 AM   Specimen: Nasopharyngeal Swab; Nasopharyngeal(NP) swabs in vial transport medium  Result Value Ref Range Status   SARS Coronavirus 2 by RT PCR NEGATIVE NEGATIVE Final    Comment: (NOTE) SARS-CoV-2 target nucleic acids are NOT DETECTED.  The SARS-CoV-2 RNA is generally detectable in upper respiratory specimens during the acute phase of infection. The lowest concentration of SARS-CoV-2 viral copies this assay can detect is 138 copies/mL. A negative result does not preclude SARS-Cov-2 infection and should not be used as the sole basis for treatment or other patient management decisions. A negative result may occur with  improper specimen collection/handling, submission of specimen other than nasopharyngeal swab, presence of viral mutation(s) within the areas targeted by this assay, and inadequate number of viral copies(<138 copies/mL). A negative result must be combined with clinical observations, patient history, and epidemiological information. The expected result is Negative.  Fact Sheet for Patients:  EntrepreneurPulse.com.au  Fact Sheet for Healthcare Providers:  IncredibleEmployment.be  This test is no t yet approved or cleared by the [place] FDA and  has been authorized for detection and/or diagnosis of SARS-CoV-2 by FDA under an  Emergency Use Authorization (EUA). This EUA will remain  in effect (meaning this test can be used) for the duration of the COVID-19 declaration under Section 564(b)(1) of the Act, 21 U.S.C.section 360bbb-3(b)(1), unless the authorization is terminated  or revoked sooner.       Influenza A by PCR NEGATIVE NEGATIVE Final   Influenza B by PCR NEGATIVE NEGATIVE Final    Comment: (NOTE) The Xpert Xpress SARS-CoV-2/FLU/RSV plus assay is intended as an aid in the  diagnosis of influenza from Nasopharyngeal swab specimens and should not be used as a sole basis for treatment. Nasal washings and aspirates are unacceptable for Xpert Xpress SARS-CoV-2/FLU/RSV testing.  Fact Sheet for Patients: EntrepreneurPulse.com.au  Fact Sheet for Healthcare Providers: IncredibleEmployment.be  This test is not yet approved or cleared by the [place] FDA and has been authorized for detection and/or diagnosis of SARS-CoV-2 by FDA under an Emergency Use Authorization (EUA). This EUA will remain in effect (meaning this test can be used) for the duration of the COVID-19 declaration under Section 564(b)(1) of the Act, 21 U.S.C. section 360bbb-3(b)(1), unless the authorization is terminated or revoked.  Performed at [facility], [address], [place], [place] [address]   Urine culture     Status: Abnormal   Collection Time: 09/17/20 11:24 AM   Specimen: Urine, Catheterized  Result Value Ref Range Status   Specimen Description   Final    URINE, CATHETERIZED Performed at [facility], [address] [place], [place] [address]    Special Requests   Final    NONE Performed at [facility], [address] [place], [place] [address]    Culture (A)  Final    70,000 COLONIES/mL ESCHERICHIA COLI Confirmed Extended Spectrum Beta-Lactamase Producer (ESBL).  In bloodstream infections from ESBL organisms, carbapenems are preferred over  piperacillin/tazobactam. They are shown to have a lower risk of mortality.    Report Status 09/19/2020 FINAL  Final   Organism ID, Bacteria ESCHERICHIA COLI (A)  Final      Susceptibility   Escherichia coli - MIC*    AMPICILLIN >=32 RESISTANT Resistant     CEFAZOLIN >=64 RESISTANT Resistant     CEFEPIME 16 RESISTANT Resistant     CEFTRIAXONE >=64 RESISTANT Resistant     CIPROFLOXACIN >=4 RESISTANT Resistant     GENTAMICIN 2 SENSITIVE Sensitive     IMIPENEM <=0.25 SENSITIVE Sensitive     NITROFURANTOIN <=16 SENSITIVE Sensitive     TRIMETH/SULFA <=20 SENSITIVE Sensitive     AMPICILLIN/SULBACTAM >=32 RESISTANT Resistant     PIP/TAZO 64 INTERMEDIATE Intermediate     * 70,000 COLONIES/mL ESCHERICHIA COLI  Culture, blood (routine x 2)     Status: None (Preliminary result)   Collection Time: 09/17/20  2:31 PM   Specimen: BLOOD  Result Value Ref Range Status   Specimen Description BLOOD BLOOD LEFT WRIST  Final   Special Requests   Final    BOTTLES DRAWN AEROBIC AND ANAEROBIC Blood Culture adequate volume   Culture   Final    NO GROWTH 4 DAYS Performed at [facility], [address] [place], [place] [address]    Report Status PENDING  Incomplete  MRSA PCR Screening     Status: None   Collection Time: 09/17/20  4:26 PM   Specimen: Nasopharyngeal  Result Value Ref Range Status   MRSA by PCR NEGATIVE NEGATIVE Final    Comment:        The GeneXpert MRSA Assay (FDA approved for NASAL specimens only), is one component of a comprehensive MRSA colonization surveillance program. It is not intended to diagnose MRSA infection nor to guide or monitor treatment for MRSA infections. Performed at [facility], [address] [place], [place] [address]   Culture, blood (routine x 2)     Status: None (Preliminary result)   Collection Time: 09/18/20  2:50 PM   Specimen: BLOOD  Result Value Ref Range Status   Specimen Description BLOOD BLOOD RIGHT ARM  Final   Special  Requests   Final    BOTTLES DRAWN AEROBIC AND ANAEROBIC Blood Culture adequate volume   Culture   Final    NO GROWTH 3 DAYS Performed at [facility], [address] [place], [place] [address]    Report Status PENDING  Incomplete     Labs: BNP (last 3 results) Recent Labs    05/22/20 0840 09/17/20 1124 09/18/20 1506  BNP 620.7* 431.2* 774.1*   Basic Metabolic Panel: Recent Labs  Lab 09/17/20 1124 09/17/20 1345 09/17/20 1811 09/18/20 1506 09/20/20 1015  NA 140  --   --  134*  133* 129*  K 3.4*  --   --  4.3  4.3 4.2  CL 101  --   --  97*  97* 92*  CO2 31  --   --  27  27 27   GLUCOSE 24*  --  76 86  87 90  BUN 15  --   --  19  19 16   CREATININE 4.84*  --   --  5.56*  5.60* 4.79*  CALCIUM 12.0*  --   --  8.9  8.8* 8.2*  MG  --  2.3  --  2.2  --   PHOS  --  3.7  --  4.0  3.9  --    Liver Function Tests: Recent Labs  Lab 09/17/20 1124 09/18/20 1506  AST 14*  --   ALT 12  --   ALKPHOS 62  --   BILITOT 0.5  --   PROT 6.2*  --   ALBUMIN 2.6* 2.4*   No results for input(s): LIPASE, AMYLASE in the last 168 hours. Recent Labs  Lab 09/17/20 1115  AMMONIA 21   CBC: Recent Labs  Lab 09/17/20 1124 09/18/20 1506 09/20/20 1015  WBC 4.0 5.4 4.9  NEUTROABS 1.0*  --  1.6*  HGB 9.1* 8.3* 8.4*  HCT 27.9* 25.5* 25.6*  MCV 88.0 86.4 86.2  PLT 144* 102* 94*   Cardiac Enzymes: No results for input(s): CKTOTAL, CKMB, CKMBINDEX, TROPONINI in the last 168 hours. BNP: Invalid input(s): POCBNP CBG: Recent Labs  Lab 09/21/20 0001 09/21/20 0447 09/21/20 0825 09/21/20 0941 09/21/20 1028  GLUCAP 113* 75 66* 85 115*   D-Dimer No results for input(s): DDIMER in the last 72 hours. Hgb A1c Recent Labs    09/18/20 1506  HGBA1C 4.1*   Lipid Profile No results for input(s): CHOL, HDL, LDLCALC, TRIG, CHOLHDL, LDLDIRECT in the last 72 hours. Thyroid function studies No results for input(s): TSH, T4TOTAL, T3FREE, THYROIDAB in the last 72  hours.  Invalid input(s): FREET3 Anemia work up No results for input(s): VITAMINB12, FOLATE, FERRITIN, TIBC, IRON, RETICCTPCT in the last 72 hours. Urinalysis    Component Value Date/Time   COLORURINE YELLOW (A) 09/17/2020 1124   APPEARANCEUR CLOUDY (A) 09/17/2020 1124   APPEARANCEUR Clear 09/15/2014 0947   LABSPEC 1.011 09/17/2020 1124   LABSPEC 1.008 09/15/2014 0947   PHURINE 7.0 09/17/2020 1124   GLUCOSEU NEGATIVE 09/17/2020 1124   GLUCOSEU 150 mg/dL 09/15/2014 0947   HGBUR NEGATIVE 09/17/2020 1124   [place] 09/17/2020 1124   BILIRUBINUR Negative 09/15/2014 0947   KETONESUR NEGATIVE 09/17/2020 1124   PROTEINUR >=300 (A) 09/17/2020 1124   UROBILINOGEN 0.2 06/30/2010 2130   NITRITE NEGATIVE 09/17/2020 1124   LEUKOCYTESUR NEGATIVE 09/17/2020 1124   LEUKOCYTESUR Negative 09/15/2014 0947   Sepsis Labs Invalid input(s): PROCALCITONIN,  WBC,  LACTICIDVEN Microbiology Recent Results (from the past 240 hour(s))  Resp Panel by RT-PCR (Flu A&B, Covid) Nasopharyngeal Swab     Status: None   Collection Time: 09/17/20 11:24 AM   Specimen: Nasopharyngeal Swab; Nasopharyngeal(NP) swabs in vial transport medium  Result Value Ref Range Status   SARS Coronavirus 2 by RT PCR NEGATIVE NEGATIVE Final    Comment: (NOTE) SARS-CoV-2 target nucleic acids are NOT DETECTED.  The SARS-CoV-2 RNA is generally detectable in upper respiratory specimens during the acute phase of infection. The lowest concentration of SARS-CoV-2 viral copies this assay can detect is 138 copies/mL. A negative result does not preclude SARS-Cov-2 infection and should not be used as the sole basis for treatment or other patient management decisions. A negative result may occur with  improper specimen collection/handling, submission of specimen other than nasopharyngeal swab, presence of viral mutation(s) within the areas targeted by this assay, and inadequate number of viral copies(<138 copies/mL). A negative  result must be combined with clinical observations, patient history, and epidemiological information. The expected result is Negative.  Fact Sheet for Patients:  EntrepreneurPulse.com.au  Fact Sheet for Healthcare Providers:  IncredibleEmployment.be  This test is no t yet approved or cleared by the [place] FDA and  has been authorized for detection and/or diagnosis of SARS-CoV-2 by FDA under an Emergency Use Authorization (EUA). This EUA will remain  in effect (meaning this test can be used) for the duration of the COVID-19 declaration under Section 564(b)(1) of the Act, 21 U.S.C.section 360bbb-3(b)(1), unless the authorization is terminated  or revoked sooner.       Influenza A by PCR NEGATIVE NEGATIVE Final   Influenza B by PCR NEGATIVE NEGATIVE Final    Comment: (NOTE) The Xpert Xpress SARS-CoV-2/FLU/RSV plus assay is intended as an aid in the diagnosis of influenza from Nasopharyngeal swab specimens and should not be used as a sole basis for treatment. Nasal washings and aspirates are unacceptable for Xpert Xpress SARS-CoV-2/FLU/RSV testing.  Fact Sheet for Patients: EntrepreneurPulse.com.au  Fact Sheet for Healthcare Providers: IncredibleEmployment.be  This test is not yet approved or cleared by the [place] FDA and has been authorized for detection and/or diagnosis of SARS-CoV-2 by FDA under an Emergency Use Authorization (EUA). This EUA will remain in effect (meaning this test can be used) for the duration of the COVID-19 declaration under Section 564(b)(1) of the Act, 21 U.S.C. section 360bbb-3(b)(1), unless the authorization is terminated or revoked.  Performed at [facility], [address], [place], [place] [address]   Urine culture     Status: Abnormal   Collection Time: 09/17/20 11:24 AM   Specimen: Urine, Catheterized  Result Value Ref Range Status   Specimen  Description   Final    URINE, CATHETERIZED Performed at [facility], [address] [place], [place] [address]    Special Requests   Final    NONE Performed at [facility], [address] [place],  [address]    Culture (A)  Final    70,000 COLONIES/mL ESCHERICHIA COLI Confirmed Extended Spectrum Beta-Lactamase Producer (ESBL).  In bloodstream infections from ESBL organisms, carbapenems are preferred over piperacillin/tazobactam. They are shown to have a lower risk of mortality.    Report Status 09/19/2020 FINAL  Final   Organism ID, Bacteria ESCHERICHIA COLI (A)  Final      Susceptibility   Escherichia coli - MIC*    AMPICILLIN >=32 RESISTANT Resistant     CEFAZOLIN >=64 RESISTANT Resistant     CEFEPIME 16 RESISTANT Resistant     CEFTRIAXONE >=  64 RESISTANT Resistant     CIPROFLOXACIN >=4 RESISTANT Resistant     GENTAMICIN 2 SENSITIVE Sensitive     IMIPENEM <=0.25 SENSITIVE Sensitive     NITROFURANTOIN <=16 SENSITIVE Sensitive     TRIMETH/SULFA <=20 SENSITIVE Sensitive     AMPICILLIN/SULBACTAM >=32 RESISTANT Resistant     PIP/TAZO 64 INTERMEDIATE Intermediate     * 70,000 COLONIES/mL ESCHERICHIA COLI  Culture, blood (routine x 2)     Status: None (Preliminary result)   Collection Time: 09/17/20  2:31 PM   Specimen: BLOOD  Result Value Ref Range Status   Specimen Description BLOOD BLOOD LEFT WRIST  Final   Special Requests   Final    BOTTLES DRAWN AEROBIC AND ANAEROBIC Blood Culture adequate volume   Culture   Final    NO GROWTH 4 DAYS Performed at [facility], [address], [place], [place] [address]    Report Status PENDING  Incomplete  MRSA PCR Screening     Status: None   Collection Time: 09/17/20  4:26 PM   Specimen: Nasopharyngeal  Result Value Ref Range Status   MRSA by PCR NEGATIVE NEGATIVE Final    Comment:        The GeneXpert MRSA Assay (FDA approved for NASAL specimens only), is one component of a comprehensive  MRSA colonization surveillance program. It is not intended to diagnose MRSA infection nor to guide or monitor treatment for MRSA infections. Performed at [facility], [address] [place], [place] [address]   Culture, blood (routine x 2)     Status: None (Preliminary result)   Collection Time: 09/18/20  2:50 PM   Specimen: BLOOD  Result Value Ref Range Status   Specimen Description BLOOD BLOOD RIGHT ARM  Final   Special Requests   Final    BOTTLES DRAWN AEROBIC AND ANAEROBIC Blood Culture adequate volume   Culture   Final    NO GROWTH 3 DAYS Performed at [facility], [address] [place], [place] [address]    Report Status PENDING  Incomplete    Please note: You were cared for by a hospitalist during your hospital stay. Once you are discharged, your primary care physician will handle any further medical issues. Please note that NO REFILLS for any discharge medications will be authorized once you are discharged, as it is imperative that you return to your primary care physician (or establish a relationship with a primary care physician if you do not have one) for your post hospital discharge needs so that they can reassess your need for medications and monitor your lab values.    Time coordinating discharge: 40 minutes  SIGNED:   Shelly Coss, MD  [facility] 09/21/2020, 11:51 AM Pager [phone]  If 7PM-7AM, please contact night-coverage [email or web address] Password [email or web address]

## 2020-09-21 NOTE — Progress Notes (Signed)
Pharmacy Antibiotic Note  [name] is a 59 y.o. female admitted on 09/17/2020 with bradycardia and hypoglycemia. Patient is ESRD on HD on Tuesday,Thursday, Saturday schedule. Patient is being treated as outpatient for thoracic osteomyelitis with disciitis. Antimicrobial regimen includes vancomycin 1 g TThSa with HD + ceftazidime 1 g TThSa with HD. Therapy is scheduled to continue until 10/10/20. Patient missed HD 12/27. Dialysis 12/28 per nephrology and then resume TThSa HD per outpatient regimen. Pharmacy has been consulted for vancomycin dosing. Patient has also been re-started on outpatient ceftazidime.   Correction: Per nephrology -- patient was recently changed to Tues-Thurs-Sat outpatient HD schedule  Plan:  Continue  Ceftazidime 1 g T-Th-Sat with HD  Continue Vancomycin schedule to prior outpatient regimen of vancomycin 1 g T-Th-Sa with HD  Will order random pre-HD level for tomorrow AM to continue outpatient weekly level schedule   Height: 5\' 5"  (165.1 cm) Weight: 86 kg (189 lb 9.5 oz) IBW/kg (Calculated) : 57  Temp (24hrs), Avg:98.3 F (36.8 C), Min:97.7 F (36.5 C), Max:98.8 F (37.1 C)  Recent Labs  Lab 09/17/20 1124 09/17/20 1718 09/17/20 1731 09/18/20 1506 09/20/20 1015  WBC 4.0  --   --  5.4 4.9  CREATININE 4.84*  --   --  5.56*  5.60* 4.79*  LATICACIDVEN 0.7  --  2.1*  --   --   VANCORANDOM  --  23  --   --   --     Estimated Creatinine Clearance: 13.4 mL/min (A) (by C-G formula based on SCr of 4.79 mg/dL (H)).    Allergies  Allergen Reactions  . Ace Inhibitors Swelling and Anaphylaxis  . Ativan [Lorazepam] Other (See Comments)    Reaction:Hallucinations and headaches  . Compazine [Prochlorperazine Edisylate] Anaphylaxis, Nausea And Vomiting and Other (See Comments)    Other reaction(s): dystonia from this vs. Reglan, 23 Jul - patient relates that she takes promethazine frequently with no problems  . Sumatriptan Succinate Other (See Comments)    Other  reaction(s): delirium and hallucinations per [facility] records  . Zofran [Ondansetron] Nausea And Vomiting    Per pt. she is allergic to zofran or will experience adverse reaction like hallucination   . Losartan Nausea Only  . Prochlorperazine Other (See Comments)    Reaction:  Unknown . Patient does not remember reaction but she does have vertigo and anxiety along with n and v at times. Could be used to treat any of these   . Reglan [Metoclopramide] Other (See Comments)    Per patient her Dr. Evelina Bucy her off it   . Scopolamine Other (See Comments)    Dizziness, also has vertigo already  . Tape Rash    Plastic tape causes rash  . Tapentadol Rash    Antimicrobials this admission: Zosyn 12/27 x 1 Cefepime 12/27 >> 12/28 Vancomycin 12/27 >>  Ceftazidime 12/29 >>  Dose adjustments this admission: N/A  Microbiology results: 12/27 UCx: ESBL E. coli 12/27 BCx: NGTD 12/27 MRSA PCR: (-)  Thank you for allowing pharmacy to be a part of this patient's care.  Dorothe Pea, PharmD, BCPS Clinical Pharmacist  09/21/2020 9:47 AM

## 2020-09-21 NOTE — Progress Notes (Signed)
I'm in room with patient. Her breakfast just arrived and she is eating. Awake, alert and says she feels fine.

## 2020-09-21 NOTE — TOC Initial Note (Addendum)
Transition of Care [name]) - Initial/Assessment Note    Patient Details  Name: [name] MRN: [id number] Date of Birth: 14-Nov-1960  Transition of Care [facility]) CM/SW Contact:    Robin Ivan, LCSW Phone Number: 09/21/2020, 9:41 AM  Clinical Narrative:                Spoke to patient via phone due to contact precautions. Patient lives alone, uses [employer] for transportation. Dialysis at [facility] Tuesday, Thursday, Saturday. PCP is [name]. [facility] [place] Patient has a cane and RW. Patient recently had [facility] [place] for RN and PT. [name] reported they stopped services in November, but can take patient back. Patient is also connected with [name] for Outpatient Palliative Care per their [facility]. Patient went to [facility] in the past. Patient reported she would like home oxygen, a [name] bed, and a 3 in 1. Explained patient would have to qualify for home oxygen and [name] bed. Checked with [name], patient does not qualify. 3 in 1 ordered through [employer]. No other needs identified at this time.   Expected [name] Plan: [name]: Continued Medical Work up   Patient Goals and CMS Choice Patient states their goals for this hospitalization and ongoing recovery are:: home with home health CMS Medicare.gov Compare Post Acute Care list provided to:: Patient Choice offered to / list presented to : Patient  Expected [name] Plan and Services Expected [name] Plan: [place]       Living arrangements for the past 2 months: Single Family Home                 DME Arranged: 3-N-1 DME Agency: [employer] Date DME Agency Contacted: 09/21/20   Representative spoke with at DME Agency: [name] HH Arranged: PT,RN (confirmed still active) [name] Agency: [name] Date [name] Agency Contacted: 09/21/20   Representative spoke with at [place]: [name]  Prior  Living Arrangements/Services Living arrangements for the past 2 months: [place] with:: Self Patient language and need for interpreter reviewed:: Yes Do you feel safe going back to the place where you live?: Yes      Need for Family Participation in Patient Care: Yes (Comment) Care giver support system in place?: Yes (comment) Current home services: DME,Home PT,Home RN Criminal Activity/Legal Involvement Pertinent to Current Situation/Hospitalization: No - Comment as needed  Activities of Daily Living Home Assistive Devices/Equipment: Cane (specify quad or straight),Walker (specify type) ADL Screening (condition at time of admission) Patient's cognitive ability adequate to safely complete daily activities?: Yes Is the patient deaf or have difficulty hearing?: No Does the patient have difficulty seeing, even when wearing glasses/contacts?: No Does the patient have difficulty concentrating, remembering, or making decisions?: No Patient able to express need for assistance with ADLs?: Yes Does the patient have difficulty dressing or bathing?: No Independently performs ADLs?: Yes (appropriate for developmental age) Does the patient have difficulty walking or climbing stairs?: Yes Weakness of Legs: Both Weakness of Arms/Hands: None  Permission Sought/Granted Permission sought to share information with : Facility Art therapist granted to share information with : Yes, Verbal Permission Granted     Permission granted to share info w AGENCY: HH, DME, Palliative Care agencies        Emotional Assessment       Orientation: : Oriented to Self,Oriented to Place,Oriented to  Time,Oriented to Situation Alcohol / Substance Use:  Not Applicable Psych Involvement: No (comment)  Admission diagnosis:  Bradycardia [R00.1] Symptomatic bradycardia [R00.1] Patient Active Problem List   Diagnosis Date Noted  . Bradycardia 09/17/2020  . Advanced care  planning/counseling discussion   . Discitis of thoracic region   . Fluid overload 08/10/2020  . HLD (hyperlipidemia) 08/10/2020  . Chest pain 08/10/2020  . CAD (coronary artery disease) 08/10/2020  . GERD (gastroesophageal reflux disease) 08/10/2020  . Obesity (BMI 30-39.9) 06/06/2020  . Anemia in ESRD (end-stage renal disease) ([place]) 05/31/2020  . Type 1 diabetes mellitus ([place]) 05/31/2020  . End-stage renal disease ([place]) 05/31/2020  . Hypertensive disorder 05/31/2020  . Arm pain 05/21/2020  . Cellulitis of right leg 05/02/2020  . ESRD (end stage renal disease) ([place]) 04/13/2020  . SOBOE (shortness of breath on exertion) 02/08/2020  . Hematoma of arm, left, subsequent encounter 11/24/2019  . Respiratory failure ([place])   . Shortness of breath   . Complication of arteriovenous dialysis fistula 11/13/2019  . PVD (peripheral vascular disease) ([place]) 11/13/2019  . Severe anemia 11/13/2019  . Pressure injury of skin 11/13/2019  . ESRD on hemodialysis ([place])   . Hemorrhagic shock ([place]) 11/11/2019  . Leg pain 10/02/2019  . CAP (community acquired pneumonia) 10/01/2019  . Atherosclerosis of native arteries of extremity with intermittent claudication ([place]) 05/01/2019  . Right-sided headache   . COPD (chronic obstructive pulmonary disease) ([place]) 10/06/2018  . Syncope 10/04/2018  . Symptomatic anemia 06/18/2018  . Gastroparesis due to DM ([place]) 01/18/2018  . Complication of vascular access for dialysis 12/04/2017  . Osteomyelitis ([place]) 09/30/2017  . Carotid stenosis 06/18/2017  . Bilateral carotid artery stenosis 06/16/2017  . Benign essential HTN 05/19/2017  . CKD (chronic kidney disease) stage 5, GFR less than 15 ml/min (HCC) 05/19/2017  . Hyperlipidemia, mixed 05/19/2017  . Shortness of breath 05/04/2017  . Rash, skin 12/16/2016  . Cellulitis of lower extremity 07/29/2016  . Chronic venous insufficiency 07/29/2016  . Lymphedema 07/29/2016  . TIA (transient ischemic attack) 04/21/2016   . Altered mental status 04/08/2016  . Hyperammonemia ([place]) 04/08/2016  . Elevated troponin 04/08/2016  . Depression 04/08/2016  . Depression, major, recurrent, severe with psychosis ([place]) 04/08/2016  . Blood in stool   . Intractable cyclical vomiting with nausea   . Reflux esophagitis   . Gastritis   . Generalized abdominal pain   . Uncontrollable vomiting   . Major depressive disorder, recurrent episode, moderate ([place]) 03/15/2016  . Adjustment disorder with mixed anxiety and depressed mood 03/15/2016  . Malnutrition of moderate degree 12/01/2015  . Renal mass   . Dyspnea   . Acute renal failure ([place])   . Respiratory failure ([place])   . High temperature 11/14/2015  . Encounter for central line placement   . Encounter for orogastric (OG) tube placement   . Nausea 11/12/2015  . Hyperkalemia 10/03/2015  . Diarrhea, unspecified 07/22/2015  . Pneumonia 05/21/2015  . Hypoglycemia 04/24/2015  . Unresponsiveness 04/24/2015  . Sinus bradycardia 04/24/2015  . Hypothermia 04/24/2015  . Acute respiratory failure ([place]) 04/24/2015  . Acute on chronic diastolic CHF (congestive heart failure) ([place]) 04/05/2015  . Diabetic gastroparesis ([place]) 04/05/2015  . Hypokalemia 04/05/2015  . Generalized weakness 04/05/2015  . Acute pulmonary edema ([place]) 04/03/2015  . Nausea and vomiting 04/03/2015  . Hypoglycemia associated with diabetes ([place]) 04/03/2015  . Anemia of chronic disease 04/03/2015  . Secondary hyperparathyroidism ([place]) 04/03/2015  . Pressure ulcer 04/02/2015  . Acute respiratory failure with hypoxia ([place]) 04/01/2015  . Adjustment disorder  with anxiety 03/14/2015  . Somatic symptom disorder, mild 03/08/2015  . Coronary artery disease involving native coronary artery of native heart without angina pectoris   . Nausea & vomiting 03/06/2015  . Abdominal pain 03/06/2015  . Type 2 diabetes mellitus with hypoglycemia without coma, without long-term current use of insulin ([place]) 03/06/2015  .  HTN (hypertension) 03/06/2015  . Gastroparesis 02/24/2015  . Pleural effusion 02/19/2015  . Bacteremia due to Enterococcus 02/19/2015  . End-stage renal disease on hemodialysis ([place]) 02/19/2015  . Diarrhea 08/01/2014   PCP:  [place], [place]:   [id number] [place], [address] [place] [address] Phone: [phone] Fax: [phone]     Social Determinants of Health (SDOH) Interventions    Readmission Risk Interventions Readmission Risk Prevention Plan 09/21/2020 08/14/2020 06/06/2020  Transportation Screening Complete - Complete  Medication Review Press photographer) Complete - Complete  PCP or Specialist appointment within 3-5 days of [name] Complete Complete Complete  HRI or Home Care Consult Complete Complete Complete  SW Recovery Care/Counseling Consult Complete Complete -  Palliative Care Screening Complete Complete Not Applicable  Skilled Nursing Facility Complete Not Applicable Not Applicable  Some recent data might be hidden

## 2020-09-21 NOTE — Evaluation (Signed)
Occupational Therapy Evaluation Patient Details Name: [name] MRN: [id number] DOB: 23-Jul-1961 Today's Date: 09/21/2020    History of Present Illness Patient is a 59 year old female with history of ESRD on dialysis TTS, diastolic congestive heart failure, coronary artery disease, laryngeal cancer, pancreatitis, hypertension, hyperlipidemia COPD, asthma, depression who was brought to the emergency department after she was found to be unresponsive at home. Found to have bradycardia, suspected sepsis.   Clinical Impression   Pt seen this date for OT evaluation in setting of acute hospitalization with bradycardia. Pt presents this date with some decreased fxl activity tolerance, but appears to be approaching her self-reported functional baseline. Pt states she is INDEP/MOD I with her rollator at home and lives in apt with level entry with her son checking on her periodically. Pt driver herself to HD. Pt requires SUPV with progress to MOD I with ADL transfers and fxl mobility with 2WW and CGA for seated LB ADLs as well as increased time 2/2 pain and LE edema. Pt requiring slightly more assistance with LB ADLs 2/2 edema, and could benefit from [facility] f/u for therapy to restore her performance with LB ADLs or modify tasks as needed.    Follow Up Recommendations  Home health OT    Equipment Recommendations  3 in 1 bedside commode;Tub/shower seat    Recommendations for Other Services       Precautions / Restrictions Precautions Precautions: Fall Restrictions Weight Bearing Restrictions: No      Mobility Bed Mobility Overal bed mobility: Modified Independent             General bed mobility comments: pt sitting EOB when OT presents    Transfers Overall transfer level: Needs assistance Equipment used: Rolling walker (2 wheeled) Transfers: Sit to/from [facility] Sit to Stand: Modified independent (Device/Increase time);Supervision Stand pivot transfers: Modified  independent (Device/Increase time);Supervision       General transfer comment: increased time, SUPV with progres to MOD I during session    Balance Overall balance assessment: Needs assistance Sitting-balance support: Feet supported Sitting balance-Leahy Scale: Normal       Standing balance-Leahy Scale: Fair Standing balance comment: G static standing with UE support, F dynamic                           ADL either performed or assessed with clinical judgement   ADL                                         General ADL Comments: Pt requires SETUP/CGA for LB dressing seated EOB and increased time, and SBA for ADL transfers with RW     Vision Patient Visual Report: No change from baseline       Perception     Praxis      Pertinent Vitals/Pain Pain Assessment: No/denies pain     Hand Dominance Left   Extremity/Trunk Assessment Upper Extremity Assessment Upper Extremity Assessment: Overall WFL for tasks assessed;Generalized weakness (MMT grossly 4-/5, slight swelling noted to L UE)   Lower Extremity Assessment Lower Extremity Assessment: Defer to PT evaluation;Overall WFL for tasks assessed;Generalized weakness (ROM WFL, MMT grossly 3+/5)   Cervical / Trunk Assessment Cervical / Trunk Assessment: Normal   Communication Communication Communication: No difficulties   Cognition Arousal/Alertness: Awake/alert Behavior During Therapy: WFL for tasks assessed/performed Overall Cognitive Status: Within Functional Limits  for tasks assessed                                     General Comments       Exercises Other Exercises Other Exercises: OT educates re: safety, energy conservation, use of RW, importance of OOB Activity. Pt with good understanding   Shoulder Instructions      Home Living Family/patient expects to be discharged to:: Private residence Living Arrangements: Alone Available Help at Discharge: Family;Available  PRN/intermittently Type of Home: Apartment Home Access: Level entry     Home Layout: One level     Bathroom Shower/Tub: Teacher, early years/pre: Standard (BSC over toilet) Bathroom Accessibility: Yes   Home Equipment: Walker - 4 wheels;Cane - single point;Cane - quad;Wheelchair - manual;Toilet riser;Walker - 2 wheels   Additional Comments: patient reports that she drives herself to dialysis      Prior Functioning/Environment Level of Independence: Independent with assistive device(s)        Comments: Mod I with limited ambulation. patient reports she ambulates in the house using furniture for support but uses walker when going for longer distances        OT Problem List: Decreased strength;Decreased activity tolerance;Cardiopulmonary status limiting activity      OT Treatment/Interventions: Self-care/ADL training;DME and/or AE instruction;Therapeutic activities;Balance training;Therapeutic exercise;Energy conservation;Patient/family education    OT Goals(Current goals can be found in the care plan section) Acute Rehab OT Goals Patient Stated Goal: to go home OT Goal Formulation: With patient Time For Goal Achievement: 10/05/20 Potential to Achieve Goals: Good ADL Goals Pt Will Perform Lower Body Dressing: with min guard assist;sit to/from stand Pt Will Transfer to Toilet: with supervision;ambulating;grab bars Pt/caregiver will Perform Home Exercise Program: Increased strength;Both right and left upper extremity;With Supervision  OT Frequency: Min 1X/week   Barriers to D/C:            Co-evaluation              AM-PAC OT "6 Clicks" Daily Activity     Outcome Measure Help from another person eating meals?: None Help from another person taking care of personal grooming?: A Little Help from another person toileting, which includes using toliet, bedpan, or urinal?: A Little Help from another person bathing (including washing, rinsing, drying)?: A  Little Help from another person to put on and taking off regular upper body clothing?: None Help from another person to put on and taking off regular lower body clothing?: A Little 6 Click Score: 20   End of Session Equipment Utilized During Treatment: Gait belt;Rolling walker Nurse Communication: Mobility status;Other (comment) (sats maintained with activity w/o nasal cannula)  Activity Tolerance: Patient tolerated treatment well Patient left: in chair;with call bell/phone within reach;with nursing/sitter in room (nurse present giving medication)  OT Visit Diagnosis: Muscle weakness (generalized) (M62.81)                Time: 1225-1310 OT Time Calculation (min): 45 min Charges:  OT General Charges $OT Visit: 1 Visit OT Evaluation $OT Eval Moderate Complexity: 1 Mod OT Treatments $Self Care/Home Management : 23-37 mins $Therapeutic Activity: 8-22 mins  Gerrianne Scale, MS, OTR/L ascom [phone] 09/21/20, 1:13 PM

## 2020-09-21 NOTE — Care Management Important Message (Signed)
Important Message  Patient Details  Name: [name] MRN: [id number] Date of Birth: 01-08-1961   Medicare Important Message Given:  Yes     Mahsa Hanser E Kei Langhorst, LCSW 09/21/2020, 12:58 PM

## 2020-09-21 NOTE — Progress Notes (Signed)
Checked on patient and asked how her nausea was and she said it was "all gone." Asked if she would take her aspirin and amlodipine now and she agreed. Pt took her pills.

## 2020-09-22 DIAGNOSIS — Z992 Dependence on renal dialysis: Secondary | ICD-10-CM | POA: Diagnosis not present

## 2020-09-22 DIAGNOSIS — G0491 Myelitis, unspecified: Secondary | ICD-10-CM | POA: Diagnosis not present

## 2020-09-22 DIAGNOSIS — N186 End stage renal disease: Secondary | ICD-10-CM | POA: Diagnosis not present

## 2020-09-22 LAB — CULTURE, BLOOD (ROUTINE X 2)
Culture: NO GROWTH
Special Requests: ADEQUATE

## 2020-09-23 LAB — CULTURE, BLOOD (ROUTINE X 2)
Culture: NO GROWTH
Special Requests: ADEQUATE

## 2020-09-24 ENCOUNTER — Other Ambulatory Visit: Payer: Self-pay

## 2020-09-24 NOTE — Patient Outreach (Signed)
[place] [facility]) Care Management  09/24/2020  [name] 01-Mar-1961 [id number]    EMMI-General Discharge RED ON EMMI ALERT Day # 1 Date: 09/23/2020 Red Alert Reason: "Scheduled follow-up? No Unfilled prescriptions? Yes"   Outreach attempt # 1 to patient. No answer. RN CM left HIPAA compliant voicemail message along with contact info.        Plan: RN CM will make outreach attempt to patient within 3-4 business days. RN CM will send unsuccessful outreach letter to patient.  Enzo Montgomery, RN,BSN,CCM [facility] Telephonic Care Management Coordinator Direct Phone: [phone] Toll Free: [phone] Fax: [phone]

## 2020-09-25 ENCOUNTER — Other Ambulatory Visit: Payer: Self-pay

## 2020-09-25 DIAGNOSIS — N186 End stage renal disease: Secondary | ICD-10-CM | POA: Diagnosis not present

## 2020-09-25 DIAGNOSIS — Z992 Dependence on renal dialysis: Secondary | ICD-10-CM | POA: Diagnosis not present

## 2020-09-25 DIAGNOSIS — G0491 Myelitis, unspecified: Secondary | ICD-10-CM | POA: Diagnosis not present

## 2020-09-25 DIAGNOSIS — Z79899 Other long term (current) drug therapy: Secondary | ICD-10-CM | POA: Diagnosis not present

## 2020-09-25 NOTE — Patient Outreach (Signed)
[place] [facility]) Care Management  09/25/2020  ARNECIA ECTOR 09/21/1961 [id number]   EMMI-General Discharge RED ON EMMI ALERT Day # 1 Date: 09/23/2020 Red Alert Reason: "Scheduled follow-up? No Unfilled prescriptions? Yes   Outreach attempt #2 to patient. Spoke with patient briefly as she reported she was at dialysis treatment. Reviewed and addressed red alerts. Patient reports she has all her meds picked up from pharmacy and at home. She denies any issues or concerns regarding them. She reports that she has not yet called PCP office to make an appt. Advised patient to do so right away. She voiced understanding. She denies any issue with transportation getting to appt. She is being followed by palliative Care. She denies any further RN CM need or concerns at this time.      Plan: RN CM will close case at this time.   Enzo Montgomery, RN,BSN,CCM [facility] Telephonic Care Management Coordinator Direct Phone: [phone] Toll Free: [phone] Fax: [phone]

## 2020-09-26 NOTE — Progress Notes (Signed)
MRN : [id number]  [name] is a 60 y.o. ([birth year]) female who presents with chief complaint of No chief complaint on file. Marland Kitchen  History of Present Illness:   The patient is seen for evaluation of dialysis access.  The patient has a history of multiple failed accesses.  There have been accesses in both arms.    Current access is via a catheter which is functioning poorly.  There have been several episodes of catheter infection as well as graft infection.  The Patient has also had life threatening hemorrhage from her left arm graft in the past.  At this point she is grown very frustrated with the catheter.  She does not feel that it is working well.  She is concerned about infection.  She is also concerned about hygiene given that she is unable to get in the shower.  She is exceedingly frustrated with multiple different aspects of her dialysis and how it is impacting her life.  The patient denies amaurosis fugax or recent TIA symptoms. There are no recent neurological changes noted. The patient denies claudication symptoms or rest pain symptoms. The patient denies history of DVT, PE or superficial thrombophlebitis. The patient denies recent episodes of angina or shortness of breath.   No outpatient medications have been marked as taking for the 09/27/20 encounter (Appointment) with Delana Meyer, Dolores Lory, MD.    Past Medical History:  Diagnosis Date  . Anemia   . Anginal pain ([place])   . Anxiety   . Arthritis   . Asthma   . Broken wrist   . Bronchitis   . chronic diastolic CHF 0/09/7492  . Chronic kidney disease   . COPD (chronic obstructive pulmonary disease) ([place])   . Coronary artery disease    a. cath 2013: stenting to RCA (report not available); b. cath 2014: LM nl, pLAD 40%, mLAD nl, ost LCx 40%, mid LCx nl, pRCA 30% @ site of prior stent, mRCA 50%  . Depression   . Diabetes mellitus ([place])   . Diabetes mellitus without complication ([place])   . Diabetic neuropathy ([place])   .  dialysis 2006  . Diverticulosis   . Dizziness   . Dyspnea   . Elevated lipids   . Environmental and seasonal allergies   . ESRD (end stage renal disease) on dialysis ([place])    M-W-F  . Gastroparesis   . GERD (gastroesophageal reflux disease)   . Headache   . History of anemia due to chronic kidney disease   . History of hiatal hernia   . HOH (hard of hearing)   . Hx of pancreatitis 2015  . Hypertension   . Lower extremity edema   . Mitral regurgitation    a. echo 10/2013: EF 62%, noWMA, mildly dilated LA, mild to mod MR/TR, GR1DD  . Myocardial infarction ([place])   . Orthopnea   . Parathyroid abnormality ([place])   . Peripheral arterial disease ([place])   . Pneumonia   . Renal cancer ([place])   . Renal insufficiency    Pt is on dialysis on M,W + F.  . Wheezing     Past Surgical History:  Procedure Laterality Date  . A/V SHUNTOGRAM Left 01/20/2018   Procedure: A/V SHUNTOGRAM;  Surgeon: Algernon Huxley, MD;  Location: [place] [facility];  Service: Cardiovascular;  Laterality: Left;  . ABDOMINAL HYSTERECTOMY  1992  . AMPUTATION TOE Left 10/02/2017   Procedure: AMPUTATION TOE-LEFT GREAT TOE;  Surgeon: Albertine Patricia, DPM;  Location[facility]  ORS;  Service: Podiatry;  Laterality: Left;  . APPENDECTOMY    . APPLICATION OF WOUND VAC N/A 11/25/2019   Procedure: APPLICATION OF WOUND VAC;  Surgeon: Katha Cabal, MD;  Location: [facility] ORS;  Service: Vascular;  Laterality: N/A;  . ARTERY BIOPSY Right 10/11/2018   Procedure: BIOPSY TEMPORAL ARTERY;  Surgeon: Vickie Epley, MD;  Location: [facility] ORS;  Service: General;  Laterality: Right;  . CARDIAC CATHETERIZATION Left 07/26/2015   Procedure: Left Heart Cath and Coronary Angiography;  Surgeon: Dionisio David, MD;  Location: [place] [facility];  Service: Cardiovascular;  Laterality: Left;  . CATARACT EXTRACTION W/ INTRAOCULAR LENS IMPLANT Right   . CATARACT EXTRACTION W/PHACO Left 03/10/2017   Procedure: CATARACT EXTRACTION PHACO AND INTRAOCULAR LENS  PLACEMENT (IOC);  Surgeon: Birder Robson, MD;  Location: [facility] ORS;  Service: Ophthalmology;  Laterality: Left;  [place] 00:51.9 AP% 14.2 CDE 7.39 fluid pack lot # [id number]  . CENTRAL LINE INSERTION Right 11/11/2019   Procedure: CENTRAL LINE INSERTION;  Surgeon: Katha Cabal, MD;  Location: [facility] ORS;  Service: Vascular;  Laterality: Right;  . CENTRAL LINE INSERTION  11/25/2019   Procedure: CENTRAL LINE INSERTION;  Surgeon: Katha Cabal, MD;  Location: [facility] ORS;  Service: Vascular;;  . CHOLECYSTECTOMY    . COLONOSCOPY WITH PROPOFOL N/A 08/12/2016   Procedure: COLONOSCOPY WITH PROPOFOL;  Surgeon: Lollie Sails, MD;  Location: [facility] ENDOSCOPY;  Service: Endoscopy;  Laterality: N/A;  . DIALYSIS FISTULA CREATION Left    upper arm  . dialysis grafts    . DIALYSIS/PERMA CATHETER INSERTION N/A 11/14/2019   Procedure: DIALYSIS/PERMA CATHETER INSERTION;  Surgeon: Algernon Huxley, MD;  Location: [place] [facility];  Service: Cardiovascular;  Laterality: N/A;  . DIALYSIS/PERMA CATHETER INSERTION N/A 02/03/2020   Procedure: DIALYSIS/PERMA CATHETER INSERTION;  Surgeon: Katha Cabal, MD;  Location: [place] [facility];  Service: Cardiovascular;  Laterality: N/A;  . DIALYSIS/PERMA CATHETER INSERTION N/A 06/19/2020   Procedure: DIALYSIS/PERMA CATHETER INSERTION;  Surgeon: Katha Cabal, MD;  Location: [place] [facility];  Service: Cardiovascular;  Laterality: N/A;  . DIALYSIS/PERMA CATHETER REMOVAL N/A 05/25/2020   Procedure: DIALYSIS/PERMA CATHETER REMOVAL;  Surgeon: Katha Cabal, MD;  Location: [place] [facility];  Service: Cardiovascular;  Laterality: N/A;  . ESOPHAGOGASTRODUODENOSCOPY N/A 03/08/2015   Procedure: ESOPHAGOGASTRODUODENOSCOPY (EGD);  Surgeon: Manya Silvas, MD;  Location: [facility] ENDOSCOPY;  Service: Endoscopy;  Laterality: N/A;  . ESOPHAGOGASTRODUODENOSCOPY (EGD) WITH PROPOFOL N/A 03/18/2016   Procedure: ESOPHAGOGASTRODUODENOSCOPY (EGD) WITH PROPOFOL;  Surgeon:  [name], MD;  Location: [facility] ENDOSCOPY;  Service: Endoscopy;  Laterality: N/A;  . EYE SURGERY Right 2018  . FECAL TRANSPLANT N/A 08/23/2015   Procedure: FECAL TRANSPLANT;  Surgeon: Manya Silvas, MD;  Location: [facility] ENDOSCOPY;  Service: Endoscopy;  Laterality: N/A;  . HAND SURGERY Bilateral   . HEMATOMA EVACUATION Left 11/25/2019   Procedure: EVACUATION HEMATOMA;  Surgeon: Katha Cabal, MD;  Location: [facility] ORS;  Service: Vascular;  Laterality: Left;  . I & D EXTREMITY Left 11/25/2019   Procedure: IRRIGATION AND DEBRIDEMENT EXTREMITY;  Surgeon: Katha Cabal, MD;  Location: [facility] ORS;  Service: Vascular;  Laterality: Left;  . IR FLUORO GUIDE CV LINE RIGHT  04/06/2020  . IR INJECT/THERA/INC NEEDLE/CATH/PLC EPI/CERV/THOR [facility]  08/13/2020  . IR RADIOLOGIST EVAL & MGMT  07/28/2019  . IR RADIOLOGIST EVAL & MGMT  08/11/2019  . LIGATION OF ARTERIOVENOUS  FISTULA Left 11/11/2019  . LIGATION OF ARTERIOVENOUS  FISTULA Left 11/11/2019  Procedure: LIGATION OF ARTERIOVENOUS  FISTULA;  Surgeon: Katha Cabal, MD;  Location: [facility] ORS;  Service: Vascular;  Laterality: Left;  . LIGATIONS OF HERO GRAFT Right 06/13/2020   Procedure: LIGATION / REMOVAL OF RIGHT HERO GRAFT;  Surgeon: Katha Cabal, MD;  Location: [facility] ORS;  Service: Vascular;  Laterality: Right;  . PERIPHERAL VASCULAR CATHETERIZATION N/A 12/20/2015   Procedure: Thrombectomy of dialysis access versus permcath placement;  Surgeon: Algernon Huxley, MD;  Location: [place] [facility];  Service: Cardiovascular;  Laterality: N/A;  . PERIPHERAL VASCULAR CATHETERIZATION N/A 12/20/2015   Procedure: A/V Shunt Intervention;  Surgeon: Algernon Huxley, MD;  Location: [place] [facility];  Service: Cardiovascular;  Laterality: N/A;  . PERIPHERAL VASCULAR CATHETERIZATION N/A 12/20/2015   Procedure: A/V Shuntogram/Fistulagram;  Surgeon: Algernon Huxley, MD;  Location: [place] [facility];  Service: Cardiovascular;  Laterality: N/A;  . PERIPHERAL VASCULAR  CATHETERIZATION N/A 01/02/2016   Procedure: A/V Shuntogram/Fistulagram;  Surgeon: Algernon Huxley, MD;  Location: [place] [facility];  Service: Cardiovascular;  Laterality: N/A;  . PERIPHERAL VASCULAR CATHETERIZATION N/A 01/02/2016   Procedure: A/V Shunt Intervention;  Surgeon: Algernon Huxley, MD;  Location: [place] [facility];  Service: Cardiovascular;  Laterality: N/A;  . TEE WITHOUT CARDIOVERSION N/A 06/11/2020   Procedure: TRANSESOPHAGEAL ECHOCARDIOGRAM (TEE);  Surgeon: Teodoro Spray, MD;  Location: [facility] ORS;  Service: Cardiovascular;  Laterality: N/A;  . UPPER EXTREMITY VENOGRAPHY Right 01/18/2020   Procedure: UPPER EXTREMITY VENOGRAPHY;  Surgeon: Katha Cabal, MD;  Location: [place] [facility];  Service: Cardiovascular;  Laterality: Right;  Marland Kitchen VASCULAR ACCESS DEVICE INSERTION Right 04/13/2020   Procedure: INSERTION OF HERO VASCULAR ACCESS DEVICE (GRAFT);  Surgeon: Katha Cabal, MD;  Location: [facility] ORS;  Service: Vascular;  Laterality: Right;    Social History Social History   Tobacco Use  . Smoking status: Never Smoker  . Smokeless tobacco: Never Used  Vaping Use  . Vaping Use: Never used  Substance Use Topics  . Alcohol use: Not Currently    Comment: glass wine week per pt  . Drug use: Yes    Types: Marijuana    Comment: once a day    Family History Family History  Problem Relation Age of Onset  . Kidney disease Mother   . Diabetes Mother   . Cancer Father   . Kidney disease Sister     Allergies  Allergen Reactions  . Ace Inhibitors Swelling and Anaphylaxis  . Ativan [Lorazepam] Other (See Comments)    Reaction:Hallucinations and headaches  . Compazine [Prochlorperazine Edisylate] Anaphylaxis, Nausea And Vomiting and Other (See Comments)    Other reaction(s): dystonia from this vs. Reglan, 23 Jul - patient relates that she takes promethazine frequently with no problems  . Sumatriptan Succinate Other (See Comments)    Other reaction(s): delirium and  hallucinations per [facility] records  . Zofran [Ondansetron] Nausea And Vomiting    Per pt. she is allergic to zofran or will experience adverse reaction like hallucination   . Losartan Nausea Only  . Prochlorperazine Other (See Comments)    Reaction:  Unknown . Patient does not remember reaction but she does have vertigo and anxiety along with n and v at times. Could be used to treat any of these   . Reglan [Metoclopramide] Other (See Comments)    Per patient her Dr. Evelina Bucy her off it   . Scopolamine Other (See Comments)    Dizziness, also has vertigo already  . Tape  Rash    Plastic tape causes rash  . Tapentadol Rash     REVIEW OF SYSTEMS (Negative unless checked)  Constitutional: [] Weight loss  [] Fever  [] Chills Cardiac: [] Chest pain   [] Chest pressure   [] Palpitations   [] Shortness of breath when laying flat   [] Shortness of breath with exertion. Vascular:  [x] Pain in legs with walking   [] Pain in legs at rest  [] History of DVT   [] Phlebitis   [] Swelling in legs   [] Varicose veins   [] Non-healing ulcers Pulmonary:   [] Uses home oxygen   [] Productive cough   [] Hemoptysis   [] Wheeze  [] COPD   [] Asthma Neurologic:  [] Dizziness   [] Seizures   [] History of stroke   [] History of TIA  [] Aphasia   [] Vissual changes   [] Weakness or numbness in arm   [] Weakness or numbness in leg Musculoskeletal:   [] Joint swelling   [x] Joint pain   [x] Low back pain Hematologic:  [] Easy bruising  [] Easy bleeding   [] Hypercoagulable state   [] Anemic Gastrointestinal:  [] Diarrhea   [] Vomiting  [x] Gastroesophageal reflux/heartburn   [] Difficulty swallowing. Genitourinary:  [x] Chronic kidney disease   [] Difficult urination  [] Frequent urination   [] Blood in urine Skin:  [] Rashes   [] Ulcers  Psychological:  [] History of anxiety   []  History of major depression.  Physical Examination  There were no vitals filed for this visit. There is no height or weight on file to calculate BMI. Gen: WD/WN, NAD Head: [place]/AT, No  temporalis wasting.  Ear/Nose/Throat: Hearing grossly intact, nares w/o erythema or drainage Eyes: PER, EOMI, sclera nonicteric.  Neck: Supple, no large masses.   Pulmonary:  Good air movement, no audible wheezing bilaterally, no use of accessory muscles.  Cardiac: RRR, no JVD Vascular: Multiple scars with palpable previous access bilateral upper extremities.  There is 4+ hard nonpitting edema of both eyes.  Both eyes are tender to palpation. Vessel Right Left  Radial Palpable Palpable  Ulnar  not palpable  not palpable  Brachial Palpable Palpable  PT  not palpable  not palpable  DP  not palpable  not palpable  Gastrointestinal: Non-distended. No guarding/no peritoneal signs.  Musculoskeletal: M/S 5/5 throughout.  No deformity or atrophy.  Neurologic: CN 2-12 intact. Symmetrical.  Speech is fluent. Motor exam as listed above. Psychiatric: Judgment intact, Mood & affect appropriate for pt's clinical situation. Dermatologic: Venous rashes no ulcers noted.  No changes consistent with cellulitis.  CBC Lab Results  Component Value Date   WBC 4.9 09/20/2020   HGB 8.4 (L) 09/20/2020   HCT 25.6 (L) 09/20/2020   MCV 86.2 09/20/2020   PLT 94 (L) 09/20/2020    BMET    Component Value Date/Time   NA 129 (L) 09/20/2020 1015   NA 135 (L) 09/15/2014 0948   K 4.2 09/20/2020 1015   K 4.8 09/15/2014 0948   CL 92 (L) 09/20/2020 1015   CL 99 09/15/2014 0948   CO2 27 09/20/2020 1015   CO2 26 09/15/2014 0948   GLUCOSE 90 09/20/2020 1015   GLUCOSE 118 (H) 09/15/2014 0948   BUN 16 09/20/2020 1015   BUN 19 (H) 09/15/2014 0948   CREATININE 4.79 (H) 09/20/2020 1015   CREATININE 6.79 (H) 09/15/2014 0948   CALCIUM 8.2 (L) 09/20/2020 1015   CALCIUM 8.3 (L) 09/15/2014 0948   GFRNONAA 10 (L) 09/20/2020 1015   GFRNONAA 7 (L) 09/15/2014 0948   GFRNONAA 6 (L) 05/31/2014 0432   GFRAA 11 (L) 06/25/2020 0641   GFRAA 8 (L) 09/15/2014 1610  GFRAA 7 (L) 05/31/2014 0432   Estimated Creatinine  Clearance: 13.4 mL/min (A) (by C-G formula based on SCr of 4.79 mg/dL (H)).  COAG Lab Results  Component Value Date   INR 1.2 09/17/2020   INR 1.3 (H) 06/13/2020   INR 1.0 04/13/2020    Radiology DG Chest 1 View  Result Date: 09/17/2020 CLINICAL DATA:  Altered mental status EXAM: CHEST  1 VIEW COMPARISON:  08/10/2020 FINDINGS: Mild cardiac enlargement. Diffuse bilateral airspace disease is symmetric and has progressed in the interval. Small bilateral pleural effusions. Dual lumen central venous catheter tip in the right atrium unchanged. Left subclavian vascular stent unchanged. IMPRESSION: Progression of diffuse bilateral airspace disease most likely edema. Small bilateral effusions. Electronically Signed   By: Franchot Gallo M.D.   On: 09/17/2020 11:36   CT Head Wo Contrast  Result Date: 09/17/2020 CLINICAL DATA:  Altered mental status. Found unresponsive. EXAM: CT HEAD WITHOUT CONTRAST TECHNIQUE: Contiguous axial images were obtained from the base of the skull through the vertex without intravenous contrast. COMPARISON:  Most recent head CT 06/11/2020 FINDINGS: Brain: No intracranial hemorrhage, mass effect, or midline shift. No hydrocephalus. The basilar cisterns are patent. No evidence of territorial infarct or acute ischemia. No extra-axial or intracranial fluid collection. Vascular: Atherosclerosis of skullbase vasculature without hyperdense vessel or abnormal calcification. Skull: No fracture or focal lesion. Sinuses/Orbits: Paranasal sinuses and mastoid air cells are clear. The visualized orbits are unremarkable. Bilateral cataract resection. Other: None. IMPRESSION: No acute intracranial abnormality. Electronically Signed   By: Keith Rake M.D.   On: 09/17/2020 15:51   US Venous Img Upper Uni Left (DVT)  Result Date: 09/21/2020 CLINICAL DATA:  Upper extremity edema. History of end-stage renal disease with indwelling tunneled left-sided dialysis catheter. Status post prior  stenting of the left subclavian vein and angioplasty of the left brachiocephalic vein. EXAM: LEFT UPPER EXTREMITY VENOUS DOPPLER ULTRASOUND TECHNIQUE: Gray-scale sonography with graded compression, as well as color Doppler and duplex ultrasound were performed to evaluate the upper extremity deep venous system from the level of the subclavian vein and including the jugular, axillary, basilic, radial, ulnar and upper cephalic vein. Spectral Doppler was utilized to evaluate flow at rest and with distal augmentation maneuvers. COMPARISON:  None. FINDINGS: Contralateral Subclavian Vein: Respiratory phasicity is normal and symmetric with the symptomatic side. No evidence of thrombus. Normal compressibility. Internal Jugular Vein: No evidence of thrombus. Normal compressibility, respiratory phasicity and response to augmentation. Subclavian Vein: Previously stented left subclavian vein is likely chronically occluded. Axillary Vein: No evidence of thrombus. Normal compressibility, respiratory phasicity and response to augmentation. Cephalic Vein: No evidence of thrombus. Normal compressibility, respiratory phasicity and response to augmentation. Basilic Vein: No evidence of thrombus. Normal compressibility, respiratory phasicity and response to augmentation. Brachial Veins: No evidence of thrombus. Normal compressibility, respiratory phasicity and response to augmentation. Radial Veins: Segment of the left radial vein is occluded. Based on small caliber of the vein and echogenic appearance of thrombus, this is felt to be likely chronic thrombus. Ulnar Veins: No evidence of thrombus. Normal compressibility, respiratory phasicity and response to augmentation. Venous Reflux:  None visualized. Other Findings: No evidence of superficial thrombophlebitis or abnormal fluid collection. IMPRESSION: 1. 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 2. Left radial vein occlusion in the forearm. Based  on small caliber of the vein and echogenic appearance of thrombus, this is felt to be likely chronic thrombus. Electronically Signed   By: Jenness Corner.D.  On: 09/21/2020 09:23     Assessment/Plan 1. Complication of vascular access for dialysis, sequela Very difficult situation.  She has had multiple problems with previous access primarily related to steal and ischemic hands and fingers.  But she has also had significant problems with infection.  At this time examination reveals her thighs to be very hostile to any kind of AV access given the profound edema.  Undoubtedly she would develop lymph leaks and a prosthesis would be at a inordinate risk for infection.  After lengthy discussion we will proceed with angiography of both upper extremities to determine the status of her arterial system.  If an option for AV access is present then venography will be performed of that upper extremity as well.  Risks and benefits of been reviewed all questions have been answered patient is agreed to proceed.   A total of 35 minutes was spent with this patient and greater than 50% was spent in counseling and coordination of care with the patient.  Discussion included the treatment options for vascular disease including indications for surgery and intervention.  Also discussed is the appropriate timing of treatment.  In addition medical therapy was discussed.  2. Atherosclerosis of native artery of both lower extremities with intermittent claudication (HCC)  Recommend:  The patient has evidence of atherosclerosis of the lower extremities with claudication.  The patient does not voice lifestyle limiting changes at this point in time.  Noninvasive studies do not suggest clinically significant change.  No invasive studies, angiography or surgery at this time The patient should continue walking and begin a more formal exercise program.  The patient should continue antiplatelet therapy and aggressive  treatment of the lipid abnormalities  No changes in the patient's medications at this time  3. Bilateral carotid artery stenosis Recommend:  Given the patient's asymptomatic subcritical stenosis no further invasive testing or surgery at this time.  Continue antiplatelet therapy as prescribed Continue management of CAD, HTN and Hyperlipidemia Healthy heart diet,  encouraged exercise at least 4 times per week   4. Coronary artery disease of native artery of native heart with stable angina pectoris (HCC) Continue cardiac and antihypertensive medications as already ordered and reviewed, no changes at this time.  Continue statin as ordered and reviewed, no changes at this time  Nitrates PRN for chest pain   5. Chronic obstructive pulmonary disease, unspecified COPD type ([place]) Continue pulmonary medications and aerosols as already ordered, these medications have been reviewed and there are no changes at this time.  6. ESRD on hemodialysis ([place]) At the present time the patient has adequate dialysis access.  Continue hemodialysis as ordered without interruption.  Avoid nephrotoxic medications and dehydration.  Further plans per nephrology   Hortencia Pilar, MD  09/26/2020 5:05 PM

## 2020-09-27 ENCOUNTER — Ambulatory Visit (INDEPENDENT_AMBULATORY_CARE_PROVIDER_SITE_OTHER): Payer: Medicare HMO | Admitting: Vascular Surgery

## 2020-09-27 ENCOUNTER — Other Ambulatory Visit: Payer: Self-pay

## 2020-09-27 ENCOUNTER — Encounter (INDEPENDENT_AMBULATORY_CARE_PROVIDER_SITE_OTHER): Payer: Self-pay | Admitting: Vascular Surgery

## 2020-09-27 ENCOUNTER — Ambulatory Visit: Payer: Medicare HMO | Attending: Infectious Diseases | Admitting: Infectious Diseases

## 2020-09-27 VITALS — BP 129/62 | HR 58 | Resp 16 | Ht 65.0 in | Wt 176.0 lb

## 2020-09-27 DIAGNOSIS — I6523 Occlusion and stenosis of bilateral carotid arteries: Secondary | ICD-10-CM

## 2020-09-27 DIAGNOSIS — J449 Chronic obstructive pulmonary disease, unspecified: Secondary | ICD-10-CM | POA: Diagnosis not present

## 2020-09-27 DIAGNOSIS — I70213 Atherosclerosis of native arteries of extremities with intermittent claudication, bilateral legs: Secondary | ICD-10-CM | POA: Diagnosis not present

## 2020-09-27 DIAGNOSIS — Z992 Dependence on renal dialysis: Secondary | ICD-10-CM

## 2020-09-27 DIAGNOSIS — N186 End stage renal disease: Secondary | ICD-10-CM

## 2020-09-27 DIAGNOSIS — I25118 Atherosclerotic heart disease of native coronary artery with other forms of angina pectoris: Secondary | ICD-10-CM

## 2020-09-27 DIAGNOSIS — T829XXS Unspecified complication of cardiac and vascular prosthetic device, implant and graft, sequela: Secondary | ICD-10-CM | POA: Diagnosis not present

## 2020-09-27 DIAGNOSIS — M4620 Osteomyelitis of vertebra, site unspecified: Secondary | ICD-10-CM

## 2020-09-27 DIAGNOSIS — G0491 Myelitis, unspecified: Secondary | ICD-10-CM | POA: Diagnosis not present

## 2020-09-27 NOTE — Progress Notes (Signed)
The purpose of this virtual visit is to provide medical care while limiting exposure to the novel coronavirus (COVID19) for both patient and office staff.   Consent was obtained for phone visit:  Yes.   Answered questions that patient had about telehealth interaction:  Yes.   I discussed the limitations, risks, security and privacy concerns of performing an evaluation and management service by telephone. I also discussed with the patient that there may be a patient responsible charge related to this service. The patient expressed understanding and agreed to proceed.   Patient Location: Home Provider Location:office Persons on the call-3 Patient, her son carl and physician PT says she is getting ready to go for vein and vascular appt Had dialysis this morning I am treating with vanco and ceftazidime during dialysis until 10/10/20 a 6 week period for T7-T8 discitis and osteomyelitis.  Pt still has some back pain, other wise doing okay No fever or rash   continue antibiotics as planned till 10/10/20 Will get lab results from [facility]  To discuss with PCP/for back brace  Discussed the management with patient and her son Time spent 7 min

## 2020-09-28 ENCOUNTER — Ambulatory Visit: Payer: Medicare HMO | Admitting: Oncology

## 2020-09-28 ENCOUNTER — Other Ambulatory Visit: Payer: Medicaid Other | Admitting: Primary Care

## 2020-09-28 ENCOUNTER — Encounter (INDEPENDENT_AMBULATORY_CARE_PROVIDER_SITE_OTHER): Payer: Self-pay | Admitting: Vascular Surgery

## 2020-09-28 ENCOUNTER — Telehealth: Payer: Self-pay | Admitting: Primary Care

## 2020-09-28 DIAGNOSIS — J449 Chronic obstructive pulmonary disease, unspecified: Secondary | ICD-10-CM | POA: Diagnosis not present

## 2020-09-28 DIAGNOSIS — D631 Anemia in chronic kidney disease: Secondary | ICD-10-CM | POA: Diagnosis not present

## 2020-09-28 DIAGNOSIS — I823 Embolism and thrombosis of renal vein: Secondary | ICD-10-CM | POA: Diagnosis not present

## 2020-09-28 DIAGNOSIS — I132 Hypertensive heart and chronic kidney disease with heart failure and with stage 5 chronic kidney disease, or end stage renal disease: Secondary | ICD-10-CM | POA: Diagnosis not present

## 2020-09-28 DIAGNOSIS — I5030 Unspecified diastolic (congestive) heart failure: Secondary | ICD-10-CM | POA: Diagnosis not present

## 2020-09-28 DIAGNOSIS — I2510 Atherosclerotic heart disease of native coronary artery without angina pectoris: Secondary | ICD-10-CM | POA: Diagnosis not present

## 2020-09-28 DIAGNOSIS — M4624 Osteomyelitis of vertebra, thoracic region: Secondary | ICD-10-CM | POA: Diagnosis not present

## 2020-09-28 DIAGNOSIS — F32A Depression, unspecified: Secondary | ICD-10-CM | POA: Diagnosis not present

## 2020-09-28 DIAGNOSIS — N186 End stage renal disease: Secondary | ICD-10-CM | POA: Diagnosis not present

## 2020-09-28 NOTE — Telephone Encounter (Signed)
T/c to confirm HV. Message left.

## 2020-09-29 DIAGNOSIS — G0491 Myelitis, unspecified: Secondary | ICD-10-CM | POA: Diagnosis not present

## 2020-09-29 DIAGNOSIS — N186 End stage renal disease: Secondary | ICD-10-CM | POA: Diagnosis not present

## 2020-09-29 DIAGNOSIS — Z992 Dependence on renal dialysis: Secondary | ICD-10-CM | POA: Diagnosis not present

## 2020-10-01 ENCOUNTER — Telehealth (INDEPENDENT_AMBULATORY_CARE_PROVIDER_SITE_OTHER): Payer: Self-pay

## 2020-10-01 DIAGNOSIS — K219 Gastro-esophageal reflux disease without esophagitis: Secondary | ICD-10-CM | POA: Diagnosis not present

## 2020-10-01 DIAGNOSIS — J449 Chronic obstructive pulmonary disease, unspecified: Secondary | ICD-10-CM | POA: Diagnosis not present

## 2020-10-01 DIAGNOSIS — G894 Chronic pain syndrome: Secondary | ICD-10-CM | POA: Diagnosis not present

## 2020-10-01 DIAGNOSIS — Z9181 History of falling: Secondary | ICD-10-CM | POA: Diagnosis not present

## 2020-10-01 DIAGNOSIS — I2510 Atherosclerotic heart disease of native coronary artery without angina pectoris: Secondary | ICD-10-CM | POA: Diagnosis not present

## 2020-10-01 DIAGNOSIS — I10 Essential (primary) hypertension: Secondary | ICD-10-CM | POA: Diagnosis not present

## 2020-10-01 DIAGNOSIS — E1142 Type 2 diabetes mellitus with diabetic polyneuropathy: Secondary | ICD-10-CM | POA: Diagnosis not present

## 2020-10-01 NOTE — Telephone Encounter (Signed)
Spoke with the patient and she is scheduled with Dr. Delana Meyer for a bilateral upper extremity angio and bilateral upper venogram on 10/17/20 with a 9:00 am arrival time to the [facility]. Covid testing on 10/15/20 between 8-1 pm at the [facility]. Pre-procedure instructions were discussed and will be mailed.

## 2020-10-02 ENCOUNTER — Inpatient Hospital Stay: Payer: Medicare HMO | Attending: Oncology | Admitting: Oncology

## 2020-10-02 ENCOUNTER — Telehealth: Payer: Self-pay | Admitting: *Deleted

## 2020-10-02 DIAGNOSIS — R5383 Other fatigue: Secondary | ICD-10-CM | POA: Diagnosis not present

## 2020-10-02 DIAGNOSIS — E1122 Type 2 diabetes mellitus with diabetic chronic kidney disease: Secondary | ICD-10-CM | POA: Insufficient documentation

## 2020-10-02 DIAGNOSIS — I252 Old myocardial infarction: Secondary | ICD-10-CM | POA: Insufficient documentation

## 2020-10-02 DIAGNOSIS — R600 Localized edema: Secondary | ICD-10-CM | POA: Insufficient documentation

## 2020-10-02 DIAGNOSIS — Z79899 Other long term (current) drug therapy: Secondary | ICD-10-CM | POA: Diagnosis not present

## 2020-10-02 DIAGNOSIS — D631 Anemia in chronic kidney disease: Secondary | ICD-10-CM | POA: Diagnosis not present

## 2020-10-02 DIAGNOSIS — R0602 Shortness of breath: Secondary | ICD-10-CM | POA: Insufficient documentation

## 2020-10-02 DIAGNOSIS — R5381 Other malaise: Secondary | ICD-10-CM | POA: Diagnosis not present

## 2020-10-02 DIAGNOSIS — N186 End stage renal disease: Secondary | ICD-10-CM | POA: Diagnosis not present

## 2020-10-02 DIAGNOSIS — I129 Hypertensive chronic kidney disease with stage 1 through stage 4 chronic kidney disease, or unspecified chronic kidney disease: Secondary | ICD-10-CM | POA: Insufficient documentation

## 2020-10-02 DIAGNOSIS — D472 Monoclonal gammopathy: Secondary | ICD-10-CM | POA: Insufficient documentation

## 2020-10-02 DIAGNOSIS — Z992 Dependence on renal dialysis: Secondary | ICD-10-CM | POA: Insufficient documentation

## 2020-10-02 DIAGNOSIS — Z7982 Long term (current) use of aspirin: Secondary | ICD-10-CM | POA: Insufficient documentation

## 2020-10-02 DIAGNOSIS — G0491 Myelitis, unspecified: Secondary | ICD-10-CM | POA: Diagnosis not present

## 2020-10-02 NOTE — Telephone Encounter (Signed)
Left VM to return call regarding follow up MRI-patient was hospitalized in December.

## 2020-10-03 ENCOUNTER — Ambulatory Visit: Payer: Medicare HMO | Admitting: Urology

## 2020-10-04 DIAGNOSIS — G0491 Myelitis, unspecified: Secondary | ICD-10-CM | POA: Diagnosis not present

## 2020-10-04 DIAGNOSIS — N186 End stage renal disease: Secondary | ICD-10-CM | POA: Diagnosis not present

## 2020-10-04 DIAGNOSIS — Z992 Dependence on renal dialysis: Secondary | ICD-10-CM | POA: Diagnosis not present

## 2020-10-05 DIAGNOSIS — I823 Embolism and thrombosis of renal vein: Secondary | ICD-10-CM | POA: Diagnosis not present

## 2020-10-05 DIAGNOSIS — I132 Hypertensive heart and chronic kidney disease with heart failure and with stage 5 chronic kidney disease, or end stage renal disease: Secondary | ICD-10-CM | POA: Diagnosis not present

## 2020-10-05 DIAGNOSIS — I2510 Atherosclerotic heart disease of native coronary artery without angina pectoris: Secondary | ICD-10-CM | POA: Diagnosis not present

## 2020-10-05 DIAGNOSIS — F32A Depression, unspecified: Secondary | ICD-10-CM | POA: Diagnosis not present

## 2020-10-05 DIAGNOSIS — I5030 Unspecified diastolic (congestive) heart failure: Secondary | ICD-10-CM | POA: Diagnosis not present

## 2020-10-05 DIAGNOSIS — M4624 Osteomyelitis of vertebra, thoracic region: Secondary | ICD-10-CM | POA: Diagnosis not present

## 2020-10-05 DIAGNOSIS — N186 End stage renal disease: Secondary | ICD-10-CM | POA: Diagnosis not present

## 2020-10-05 DIAGNOSIS — J449 Chronic obstructive pulmonary disease, unspecified: Secondary | ICD-10-CM | POA: Diagnosis not present

## 2020-10-05 DIAGNOSIS — D631 Anemia in chronic kidney disease: Secondary | ICD-10-CM | POA: Diagnosis not present

## 2020-10-06 DIAGNOSIS — N186 End stage renal disease: Secondary | ICD-10-CM | POA: Diagnosis not present

## 2020-10-06 DIAGNOSIS — Z992 Dependence on renal dialysis: Secondary | ICD-10-CM | POA: Diagnosis not present

## 2020-10-06 DIAGNOSIS — G0491 Myelitis, unspecified: Secondary | ICD-10-CM | POA: Diagnosis not present

## 2020-10-06 NOTE — Progress Notes (Signed)
Hematology/Oncology Consult note [facility]  Telephone:([phone] [phone]  Patient Care Team: Center, [facility] as PCP - General (General Practice) Corey Skains, MD as PCP - Cardiology (Cardiology) Lavonia Dana, MD as Consulting Physician (Internal Medicine) Freddy Finner, NP (Nurse Practitioner)   Name of the patient: [name]  [id number]  1961-09-19   Date of visit: 10/06/20  Diagnosis-  anemia of chronic kidney disease MGUS  Chief complaint/ Reason for visit-routine follow-up of anemia and MGUS  Heme/Onc history: patient is a60year-old female with a past medical history significant for coronary artery disease, type 2 diabetes with neuropathy, hypertension hyperlipidemia and osteopenia arthritis. She has ESRD secondary to HTN/DM and has been on dialysis for about 14 years. She was seen by rheumatology for complaints of low back pain. She also had painful subcutaneous skin rashes and was seen by dermatology and biopsy of one of the lesions showed mild septal panicky like this. As a part of workup by rheumatology doctor Dr. Lezlie Lye- Meda Coffee she had SPEP done which came back abnormal with the presence of M spike and hence has been referred to [place]. There was still some ongoing concern about possible lupus and she follows up with rheumatology. Her last colonoscopy was in November 2017 which showed tubular adenoma and a repeat colonoscopy was recommended in 5 years.  CBC from 01/08/2017 showed white count of 6.7, H&H of 13.9/42.8 and a platelet count of 161. CMP showed elevated BUN of 43 and creatinine of 5.22. Calcium was normal at 9.7. Total protein was mildly elevated at 8.6. An albumin was normal at 3.9. SPEP from 11/06/2016 did not reveal any M spike. M spike of 4.2% was noted on 24-hour urine protein.A lambda free light chain ratio was normal although both And lambda were elevated due to chronic kidney disease.she gets  epo through dialysis. She also has kidney mass suspicious for RCC being followed by Dr. Erlene Quan   Interval history- Patient was recently hospitalized for symptoms of hypoglycemia and unresponsiveness.  She continues to feel poorly with chronic fatigue  ECOG PS- 3 Pain scale- 4   Review of systems- Review of Systems  Constitutional: Positive for malaise/fatigue.  Respiratory: Positive for shortness of breath.        Allergies  Allergen Reactions  . Ace Inhibitors Swelling and Anaphylaxis  . Ativan [Lorazepam] Other (See Comments)    Reaction:Hallucinations and headaches  . Compazine [Prochlorperazine Edisylate] Anaphylaxis, Nausea And Vomiting and Other (See Comments)    Other reaction(s): dystonia from this vs. Reglan, 23 Jul - patient relates that she takes promethazine frequently with no problems  . Sumatriptan Succinate Other (See Comments)    Other reaction(s): delirium and hallucinations per [facility] records  . Zofran [Ondansetron] Nausea And Vomiting    Per pt. she is allergic to zofran or will experience adverse reaction like hallucination   . Losartan Nausea Only  . Prochlorperazine Other (See Comments)    Reaction:  Unknown . Patient does not remember reaction but she does have vertigo and anxiety along with n and v at times. Could be used to treat any of these   . Reglan [Metoclopramide] Other (See Comments)    Per patient her Dr. Evelina Bucy her off it   . Scopolamine Other (See Comments)    Dizziness, also has vertigo already  . Tape Rash    Plastic tape causes rash  . Tapentadol Rash     Past Medical History:  Diagnosis Date  .  Anemia   . Anginal pain ([place])   . Anxiety   . Arthritis   . Asthma   . Broken wrist   . Bronchitis   . chronic diastolic CHF 2/72/5366  . Chronic kidney disease   . COPD (chronic obstructive pulmonary disease) ([place])   . Coronary artery disease    a. cath 2013: stenting to RCA (report not available); b. cath 2014: LM nl, pLAD 40%,  mLAD nl, ost LCx 40%, mid LCx nl, pRCA 30% @ site of prior stent, mRCA 50%  . Depression   . Diabetes mellitus ([place])   . Diabetes mellitus without complication ([place])   . Diabetic neuropathy ([place])   . dialysis 2006  . Diverticulosis   . Dizziness   . Dyspnea   . Elevated lipids   . Environmental and seasonal allergies   . ESRD (end stage renal disease) on dialysis ([place])    M-W-F  . Gastroparesis   . GERD (gastroesophageal reflux disease)   . Headache   . History of anemia due to chronic kidney disease   . History of hiatal hernia   . HOH (hard of hearing)   . Hx of pancreatitis 2015  . Hypertension   . Lower extremity edema   . Mitral regurgitation    a. echo 10/2013: EF 62%, noWMA, mildly dilated LA, mild to mod MR/TR, GR1DD  . Myocardial infarction ([place])   . Orthopnea   . Parathyroid abnormality ([place])   . Peripheral arterial disease ([place])   . Pneumonia   . Renal cancer ([place])   . Renal insufficiency    Pt is on dialysis on M,W + F.  . Wheezing      Past Surgical History:  Procedure Laterality Date  . A/V SHUNTOGRAM Left 01/20/2018   Procedure: A/V SHUNTOGRAM;  Surgeon: Algernon Huxley, MD;  Location: [place] [facility];  Service: Cardiovascular;  Laterality: Left;  . ABDOMINAL HYSTERECTOMY  1992  . AMPUTATION TOE Left 10/02/2017   Procedure: AMPUTATION TOE-LEFT GREAT TOE;  Surgeon: Albertine Patricia, DPM;  Location: [facility];  Service: Podiatry;  Laterality: Left;  . APPENDECTOMY    . APPLICATION OF WOUND VAC N/A 11/25/2019   Procedure: APPLICATION OF WOUND VAC;  Surgeon: Katha Cabal, MD;  Location: [facility];  Service: Vascular;  Laterality: N/A;  . ARTERY BIOPSY Right 10/11/2018   Procedure: BIOPSY TEMPORAL ARTERY;  Surgeon: Vickie Epley, MD;  Location: [facility];  Service: General;  Laterality: Right;  . CARDIAC CATHETERIZATION Left 07/26/2015   Procedure: Left Heart Cath and Coronary Angiography;  Surgeon: Dionisio David, MD;  Location: [place] [facility];   Service: Cardiovascular;  Laterality: Left;  . CATARACT EXTRACTION W/ INTRAOCULAR LENS IMPLANT Right   . CATARACT EXTRACTION W/PHACO Left 03/10/2017   Procedure: CATARACT EXTRACTION PHACO AND INTRAOCULAR LENS PLACEMENT (IOC);  Surgeon: Birder Robson, MD;  Location: [facility];  Service: Ophthalmology;  Laterality: Left;  [place] 00:51.9 AP% 14.2 CDE 7.39 fluid pack lot # [id number]  . CENTRAL LINE INSERTION Right 11/11/2019   Procedure: CENTRAL LINE INSERTION;  Surgeon: Katha Cabal, MD;  Location: [facility];  Service: Vascular;  Laterality: Right;  . CENTRAL LINE INSERTION  11/25/2019   Procedure: CENTRAL LINE INSERTION;  Surgeon: Katha Cabal, MD;  Location: [facility];  Service: Vascular;;  . CHOLECYSTECTOMY    . COLONOSCOPY WITH PROPOFOL N/A 08/12/2016   Procedure: COLONOSCOPY WITH PROPOFOL;  Surgeon: Lollie Sails, MD;  Location: [facility];  Service: Endoscopy;  Laterality: N/A;  . DIALYSIS FISTULA CREATION Left    upper arm  . dialysis grafts    . DIALYSIS/PERMA CATHETER INSERTION N/A 11/14/2019   Procedure: DIALYSIS/PERMA CATHETER INSERTION;  Surgeon: Algernon Huxley, MD;  Location: [place] [facility];  Service: Cardiovascular;  Laterality: N/A;  . DIALYSIS/PERMA CATHETER INSERTION N/A 02/03/2020   Procedure: DIALYSIS/PERMA CATHETER INSERTION;  Surgeon: Katha Cabal, MD;  Location: [place] [facility];  Service: Cardiovascular;  Laterality: N/A;  . DIALYSIS/PERMA CATHETER INSERTION N/A 06/19/2020   Procedure: DIALYSIS/PERMA CATHETER INSERTION;  Surgeon: Katha Cabal, MD;  Location: [place] [facility];  Service: Cardiovascular;  Laterality: N/A;  . DIALYSIS/PERMA CATHETER REMOVAL N/A 05/25/2020   Procedure: DIALYSIS/PERMA CATHETER REMOVAL;  Surgeon: Katha Cabal, MD;  Location: [place] [facility];  Service: Cardiovascular;  Laterality: N/A;  . ESOPHAGOGASTRODUODENOSCOPY N/A 03/08/2015   Procedure: ESOPHAGOGASTRODUODENOSCOPY (EGD);  Surgeon: Manya Silvas,  MD;  Location: [facility] ENDOSCOPY;  Service: Endoscopy;  Laterality: N/A;  . ESOPHAGOGASTRODUODENOSCOPY (EGD) WITH PROPOFOL N/A 03/18/2016   Procedure: ESOPHAGOGASTRODUODENOSCOPY (EGD) WITH PROPOFOL;  Surgeon: [name], MD;  Location: [facility] ENDOSCOPY;  Service: Endoscopy;  Laterality: N/A;  . EYE SURGERY Right 2018  . FECAL TRANSPLANT N/A 08/23/2015   Procedure: FECAL TRANSPLANT;  Surgeon: Manya Silvas, MD;  Location: [facility] ENDOSCOPY;  Service: Endoscopy;  Laterality: N/A;  . HAND SURGERY Bilateral   . HEMATOMA EVACUATION Left 11/25/2019   Procedure: EVACUATION HEMATOMA;  Surgeon: Katha Cabal, MD;  Location: [facility];  Service: Vascular;  Laterality: Left;  . I & D EXTREMITY Left 11/25/2019   Procedure: IRRIGATION AND DEBRIDEMENT EXTREMITY;  Surgeon: Katha Cabal, MD;  Location: [facility];  Service: Vascular;  Laterality: Left;  . IR FLUORO GUIDE CV LINE RIGHT  04/06/2020  . IR INJECT/THERA/INC NEEDLE/CATH/PLC EPI/CERV/THOR [facility]  08/13/2020  . IR RADIOLOGIST EVAL & MGMT  07/28/2019  . IR RADIOLOGIST EVAL & MGMT  08/11/2019  . LIGATION OF ARTERIOVENOUS  FISTULA Left 11/11/2019  . LIGATION OF ARTERIOVENOUS  FISTULA Left 11/11/2019   Procedure: LIGATION OF ARTERIOVENOUS  FISTULA;  Surgeon: Katha Cabal, MD;  Location: [facility];  Service: Vascular;  Laterality: Left;  . LIGATIONS OF HERO GRAFT Right 06/13/2020   Procedure: LIGATION / REMOVAL OF RIGHT HERO GRAFT;  Surgeon: Katha Cabal, MD;  Location: [facility];  Service: Vascular;  Laterality: Right;  . PERIPHERAL VASCULAR CATHETERIZATION N/A 12/20/2015   Procedure: Thrombectomy of dialysis access versus permcath placement;  Surgeon: Algernon Huxley, MD;  Location: [place] [facility];  Service: Cardiovascular;  Laterality: N/A;  . PERIPHERAL VASCULAR CATHETERIZATION N/A 12/20/2015   Procedure: A/V Shunt Intervention;  Surgeon: Algernon Huxley, MD;  Location: [place] [facility];  Service: Cardiovascular;  Laterality: N/A;  . PERIPHERAL  VASCULAR CATHETERIZATION N/A 12/20/2015   Procedure: A/V Shuntogram/Fistulagram;  Surgeon: Algernon Huxley, MD;  Location: [place] [facility];  Service: Cardiovascular;  Laterality: N/A;  . PERIPHERAL VASCULAR CATHETERIZATION N/A 01/02/2016   Procedure: A/V Shuntogram/Fistulagram;  Surgeon: Algernon Huxley, MD;  Location: [place] [facility];  Service: Cardiovascular;  Laterality: N/A;  . PERIPHERAL VASCULAR CATHETERIZATION N/A 01/02/2016   Procedure: A/V Shunt Intervention;  Surgeon: Algernon Huxley, MD;  Location: [place] [facility];  Service: Cardiovascular;  Laterality: N/A;  . TEE WITHOUT CARDIOVERSION N/A 06/11/2020   Procedure: TRANSESOPHAGEAL ECHOCARDIOGRAM (TEE);  Surgeon: Teodoro Spray, MD;  Location: [facility];  Service: Cardiovascular;  Laterality: N/A;  . UPPER  EXTREMITY VENOGRAPHY Right 01/18/2020   Procedure: UPPER EXTREMITY VENOGRAPHY;  Surgeon: Katha Cabal, MD;  Location: [place] [facility];  Service: Cardiovascular;  Laterality: Right;  Marland Kitchen VASCULAR ACCESS DEVICE INSERTION Right 04/13/2020   Procedure: INSERTION OF HERO VASCULAR ACCESS DEVICE (GRAFT);  Surgeon: Katha Cabal, MD;  Location: [facility];  Service: Vascular;  Laterality: Right;    Social History   Socioeconomic History  . Marital status: Divorced    Spouse name: Not on file  . Number of children: Not on file  . Years of education: Not on file  . Highest education level: Not on file  Occupational History  . Not on file  Tobacco Use  . Smoking status: Never Smoker  . Smokeless tobacco: Never Used  Vaping Use  . Vaping Use: Never used  Substance and Sexual Activity  . Alcohol use: Not Currently    Comment: glass wine week per pt  . Drug use: Yes    Types: Marijuana    Comment: once a day  . Sexual activity: Never  Other Topics Concern  . Not on file  Social History Narrative   ** Merged History Encounter **       Social Determinants of Health   Financial Resource Strain: Not on file  Food  Insecurity: Not on file  Transportation Needs: Not on file  Physical Activity: Not on file  Stress: Not on file  Social Connections: Not on file  Intimate Partner Violence: Not on file    Family History  Problem Relation Age of Onset  . Kidney disease Mother   . Diabetes Mother   . Cancer Father   . Kidney disease Sister      Current Outpatient Medications:  .  albuterol (PROVENTIL) (2.5 MG/3ML) 0.083% nebulizer solution, Take 2.5 mg by nebulization every 4 (four) hours as needed for wheezing or shortness of breath., Disp: , Rfl:  .  albuterol (VENTOLIN HFA) 108 (90 Base) MCG/ACT inhaler, Inhale 2 puffs into the lungs every 4 (four) hours as needed for wheezing or shortness of breath., Disp: , Rfl:  .  alum & mag hydroxide-simeth (MAALOX/MYLANTA) 200-200-20 MG/5ML suspension, Take 15 mLs by mouth every 6 (six) hours as needed for indigestion or heartburn., Disp: 355 mL, Rfl: 0 .  amLODipine (NORVASC) 10 MG tablet, Take 1 tablet (10 mg total) by mouth daily., Disp: , Rfl:  .  ammonium lactate (LAC-HYDRIN) 12 % lotion, Apply 1 application topically 2 (two) times daily as needed for dry skin. , Disp: , Rfl:  .  Ascorbic Acid (VITA-C PO), Take 1 tablet by mouth daily., Disp: , Rfl:  .  aspirin EC 81 MG tablet, Take 81 mg by mouth daily., Disp: , Rfl:  .  atorvastatin (LIPITOR) 20 MG tablet, Take 20 mg by mouth every evening. , Disp: , Rfl:  .  calcium acetate (PHOSLO) 667 MG capsule, Take by mouth., Disp: , Rfl:  .  calcium carbonate (TUMS - DOSED IN MG ELEMENTAL CALCIUM) 500 MG chewable tablet, Chew 2.5 tablets (500 mg of elemental calcium total) by mouth every 6 (six) hours as needed for indigestion., Disp: , Rfl:  .  cefTAZidime (FORTAZ) IVPB, Inject 1 g into the vein every Monday, Wednesday, and Friday with hemodialysis. Indication: Thoracic vertebral osteomyelitis with disciits First Dose: Yes Last Day of Therapy:  10/10/20 Labs - Once weekly:  CBC/D, CMP, ESR, vancomycin trough   Method  of administration may be changed at the discretion of home infusion pharmacist  based upon assessment of the patient and/or caregiver's ability to self-administer the medication ordered. (Patient taking differently: Inject 1 g into the vein Every Tuesday,Thursday,and Saturday with dialysis. Indication: Thoracic vertebral osteomyelitis with disciits First Dose: Yes Last Day of Therapy:  10/10/20 Labs - Once weekly:  CBC/D, CMP, ESR, vancomycin trough   Method of administration may be changed at the discretion of home infusion pharmacist based upon assessment of the patient and/or caregiver's ability to self-administer the medication ordered.), Disp: 10 Units, Rfl: 0 .  Cholecalciferol (VITAMIN D3) 25 MCG (1000 UT) CAPS, Take 1,000 Units by mouth daily. , Disp: , Rfl:  .  cholestyramine (QUESTRAN) 4 g packet, Take 4 g by mouth 2 (two) times daily., Disp: , Rfl:  .  cholestyramine light (PREVALITE) 4 g packet, Take 4 g by mouth 2 (two) times daily., Disp: , Rfl:  .  CREON 12000 units CPEP capsule, Take 36,000 Units by mouth 3 (three) times daily before meals. , Disp: , Rfl:  .  cyanocobalamin 1000 MCG tablet, Take 1,000 mcg by mouth daily. , Disp: , Rfl:  .  diphenhydrAMINE (BENADRYL) 25 MG tablet, Take 25 mg by mouth every 6 (six) hours as needed for itching., Disp: , Rfl:  .  epoetin alfa (EPOGEN) 10000 UNIT/ML injection, Inject 1 mL (10,000 Units total) into the vein every Monday, Wednesday, and Friday with hemodialysis., Disp: 1 mL, Rfl:  .  feeding supplement, ENSURE ENLIVE, (ENSURE ENLIVE) LIQD, Take 237 mLs by mouth daily., Disp: 237 mL, Rfl: 12 .  Ferrous Sulfate (IRON) 325 (65 Fe) MG TABS, Take 325 mg by mouth daily. , Disp: , Rfl:  .  furosemide (LASIX) 80 MG tablet, Take 80 mg by mouth daily., Disp: , Rfl:  .  furosemide (LASIX) 80 MG tablet, Take 1 tablet by mouth daily., Disp: , Rfl:  .  gabapentin (NEURONTIN) 100 MG capsule, Take 100 mg by mouth every evening. , Disp: , Rfl:  .  hydrALAZINE  (APRESOLINE) 50 MG tablet, Take 1 tablet (50 mg total) by mouth every 8 (eight) hours., Disp: , Rfl:  .  HYDROcodone-acetaminophen (NORCO/VICODIN) 5-325 MG tablet, Take 1 tablet by mouth 3 (three) times daily as needed for moderate pain or severe pain., Disp: , Rfl:  .  hydrOXYzine (ATARAX/VISTARIL) 25 MG tablet, Take 25 mg by mouth daily. , Disp: , Rfl:  .  megestrol (MEGACE) 400 MG/10ML suspension, Take 10 mLs (400 mg total) by mouth daily., Disp: 240 mL, Rfl: 1 .  multivitamin (RENA-VIT) TABS tablet, Take 1 tablet by mouth daily. , Disp: , Rfl:  .  mupirocin ointment (BACTROBAN) 2 %, Apply 1 application topically daily as needed (leg rash). , Disp: , Rfl:  .  nitroGLYCERIN (NITROSTAT) 0.4 MG SL tablet, Place 0.4 mg under the tongue every 5 (five) minutes as needed for chest pain. , Disp: , Rfl:  .  Omega-3 300 MG CAPS, Take 300 mg by mouth 2 (two) times daily. Every other day opposite omega XL, Disp: , Rfl:  .  ondansetron (ZOFRAN) 4 MG tablet, Take 1 tablet (4 mg total) by mouth every 8 (eight) hours as needed for nausea or vomiting., Disp: 30 tablet, Rfl: 1 .  pantoprazole (PROTONIX) 40 MG tablet, Take 40 mg by mouth daily., Disp: , Rfl:  .  polyethylene glycol (MIRALAX / GLYCOLAX) 17 g packet, Take 17 g by mouth daily., Disp: , Rfl: 0 .  Probiotic Product (PROBIOTIC PO), Take 1 capsule by mouth daily., Disp: , Rfl:  .  promethazine (PHENERGAN) 25 MG tablet, Take 1 tablet (25 mg total) by mouth every 6 (six) hours as needed for nausea or vomiting., Disp: 30 tablet, Rfl: 0 .  sevelamer carbonate (RENVELA) 800 MG tablet, Take 800 mg by mouth 4 (four) times daily. (at meals and with a snack), Disp: , Rfl:  .  vancomycin IVPB, Inject 1,000 mg into the vein every Monday, Wednesday, and Friday with hemodialysis. Indication:  Thoracic vertebral osteomyelitis with disciits First Dose: Yes Last Day of Therapy:  10/10/20 Labs - Weekly:  CBC/D, CMP, vancomycin trough, ESR Method of administration:Elastomeric  Method of administration may be changed at the discretion of the patient and/or caregiver's ability to self-administer the medication ordered. (Patient taking differently: Inject 1,000 mg into the vein Every Tuesday,Thursday,and Saturday with dialysis. Indication:  Thoracic vertebral osteomyelitis with disciits First Dose: Yes Last Day of Therapy:  10/10/20 Labs - Weekly:  CBC/D, CMP, vancomycin trough, ESR Method of administration:Elastomeric Method of administration may be changed at the discretion of the patient and/or caregiver's ability to self-administer the medication ordered.), Disp: 10 Units, Rfl: 0  Physical exam: There were no vitals filed for this visit. Physical Exam Constitutional:      Comments: Appears older than stated age.  Sitting in a wheelchair  Eyes:     Extraocular Movements: EOM normal.     Pupils: Pupils are equal, round, and reactive to light.  Cardiovascular:     Rate and Rhythm: Normal rate and regular rhythm.     Heart sounds: Normal heart sounds.  Pulmonary:     Effort: Pulmonary effort is normal.     Breath sounds: Normal breath sounds.  Abdominal:     General: Bowel sounds are normal.     Palpations: Abdomen is soft.  Musculoskeletal:     Comments: Bilateral +1 edema  Skin:    General: Skin is warm and dry.  Neurological:     Mental Status: She is alert and oriented to person, place, and time.      CMP Latest Ref Rng & Units 09/20/2020  Glucose 70 - 99 mg/dL 90  BUN 8 - 23 mg/dL 16  Creatinine 0.44 - 1.00 mg/dL 4.79(H)  Sodium 135 - 145 mmol/L 129(L)  Potassium 3.5 - 5.1 mmol/L 4.2  Chloride 98 - 111 mmol/L 92(L)  CO2 22 - 32 mmol/L 27  Calcium 8.9 - 10.3 mg/dL 8.2(L)  Total Protein 6.5 - 8.1 g/dL -  Total Bilirubin 0.3 - 1.2 mg/dL -  Alkaline Phos 38 - 126 U/L -  AST 15 - 41 U/L -  ALT 0 - 44 U/L -   CBC Latest Ref Rng & Units 09/20/2020  WBC 4.0 - 10.5 K/uL 4.9  Hemoglobin 12.0 - 15.0 g/dL 8.4(L)  Hematocrit 36.0 - 46.0 % 25.6(L)   Platelets 150 - 400 K/uL 94(L)    No images are attached to the encounter.  DG Chest 1 View  Result Date: 09/17/2020 CLINICAL DATA:  Altered mental status EXAM: CHEST  1 VIEW COMPARISON:  08/10/2020 FINDINGS: Mild cardiac enlargement. Diffuse bilateral airspace disease is symmetric and has progressed in the interval. Small bilateral pleural effusions. Dual lumen central venous catheter tip in the right atrium unchanged. Left subclavian vascular stent unchanged. IMPRESSION: Progression of diffuse bilateral airspace disease most likely edema. Small bilateral effusions. Electronically Signed   By: Franchot Gallo M.D.   On: 09/17/2020 11:36   CT Head Wo Contrast  Result Date: 09/17/2020 CLINICAL DATA:  Altered mental status. Found unresponsive.  EXAM: CT HEAD WITHOUT CONTRAST TECHNIQUE: Contiguous axial images were obtained from the base of the skull through the vertex without intravenous contrast. COMPARISON:  Most recent head CT 06/11/2020 FINDINGS: Brain: No intracranial hemorrhage, mass effect, or midline shift. No hydrocephalus. The basilar cisterns are patent. No evidence of territorial infarct or acute ischemia. No extra-axial or intracranial fluid collection. Vascular: Atherosclerosis of skullbase vasculature without hyperdense vessel or abnormal calcification. Skull: No fracture or focal lesion. Sinuses/Orbits: Paranasal sinuses and mastoid air cells are clear. The visualized orbits are unremarkable. Bilateral cataract resection. Other: None. IMPRESSION: No acute intracranial abnormality. Electronically Signed   By: Keith Rake M.D.   On: 09/17/2020 15:51   US Venous Img Upper Uni Left (DVT)  Result Date: 09/21/2020 CLINICAL DATA:  Upper extremity edema. History of end-stage renal disease with indwelling tunneled left-sided dialysis catheter. Status post prior stenting of the left subclavian vein and angioplasty of the left brachiocephalic vein. EXAM: LEFT UPPER EXTREMITY VENOUS DOPPLER  ULTRASOUND TECHNIQUE: Gray-scale sonography with graded compression, as well as color Doppler and duplex ultrasound were performed to evaluate the upper extremity deep venous system from the level of the subclavian vein and including the jugular, axillary, basilic, radial, ulnar and upper cephalic vein. Spectral Doppler was utilized to evaluate flow at rest and with distal augmentation maneuvers. COMPARISON:  None. FINDINGS: Contralateral Subclavian Vein: Respiratory phasicity is normal and symmetric with the symptomatic side. No evidence of thrombus. Normal compressibility. Internal Jugular Vein: No evidence of thrombus. Normal compressibility, respiratory phasicity and response to augmentation. Subclavian Vein: Previously stented left subclavian vein is likely chronically occluded. Axillary Vein: No evidence of thrombus. Normal compressibility, respiratory phasicity and response to augmentation. Cephalic Vein: No evidence of thrombus. Normal compressibility, respiratory phasicity and response to augmentation. Basilic Vein: No evidence of thrombus. Normal compressibility, respiratory phasicity and response to augmentation. Brachial Veins: No evidence of thrombus. Normal compressibility, respiratory phasicity and response to augmentation. Radial Veins: Segment of the left radial vein is occluded. Based on small caliber of the vein and echogenic appearance of thrombus, this is felt to be likely chronic thrombus. Ulnar Veins: No evidence of thrombus. Normal compressibility, respiratory phasicity and response to augmentation. Venous Reflux:  None visualized. Other Findings: No evidence of superficial thrombophlebitis or abnormal fluid collection. IMPRESSION: 1. 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 2. Left radial vein occlusion in the forearm. Based on small caliber of the vein and echogenic appearance of thrombus, this is felt to be likely chronic thrombus. Electronically  Signed   By: Aletta Edouard M.D.   On: 09/21/2020 09:23     Assessment and plan- Patient is a 60 y.o. female with history of MGUS and anemia of chronic kidney disease here for routine follow-up  Patient has poor venous access and we have not been able to get blood draw whenever she comes to our clinic.Her main blood draw is when she goes to nephrology.  She has anemia of chronic kidney disease and her hemoglobin has been fluctuating between 7-9 for the most part over the last 4 months.  She receives Retacrit through dialysis.  I will get in touch with Dr. Candiss Norse to see if her Retacrit dose can be increased.  She also has a history of renal mass which is followed by Dr. Erlene Quan and has an MRI coming up soon.  So far this mass has been observed conservatively.  I will see her back in 6 months   Visit Diagnosis  1. MGUS (monoclonal gammopathy of unknown significance)   2. Anemia in ESRD (end-stage renal disease) ([place])      Dr. Randa Evens, MD, MPH [facility] at [facility] [phone] 10/06/2020 7:17 PM              '

## 2020-10-08 IMAGING — DX DG CHEST 1V PORT
1 series · 1 of 1 positions shown · non-contrast
Comparison: CT 05/22/2020, radiograph 05/22/2020

CLINICAL DATA: Shortness of breath

EXAM:
PORTABLE CHEST 1 VIEW

[chest ap]
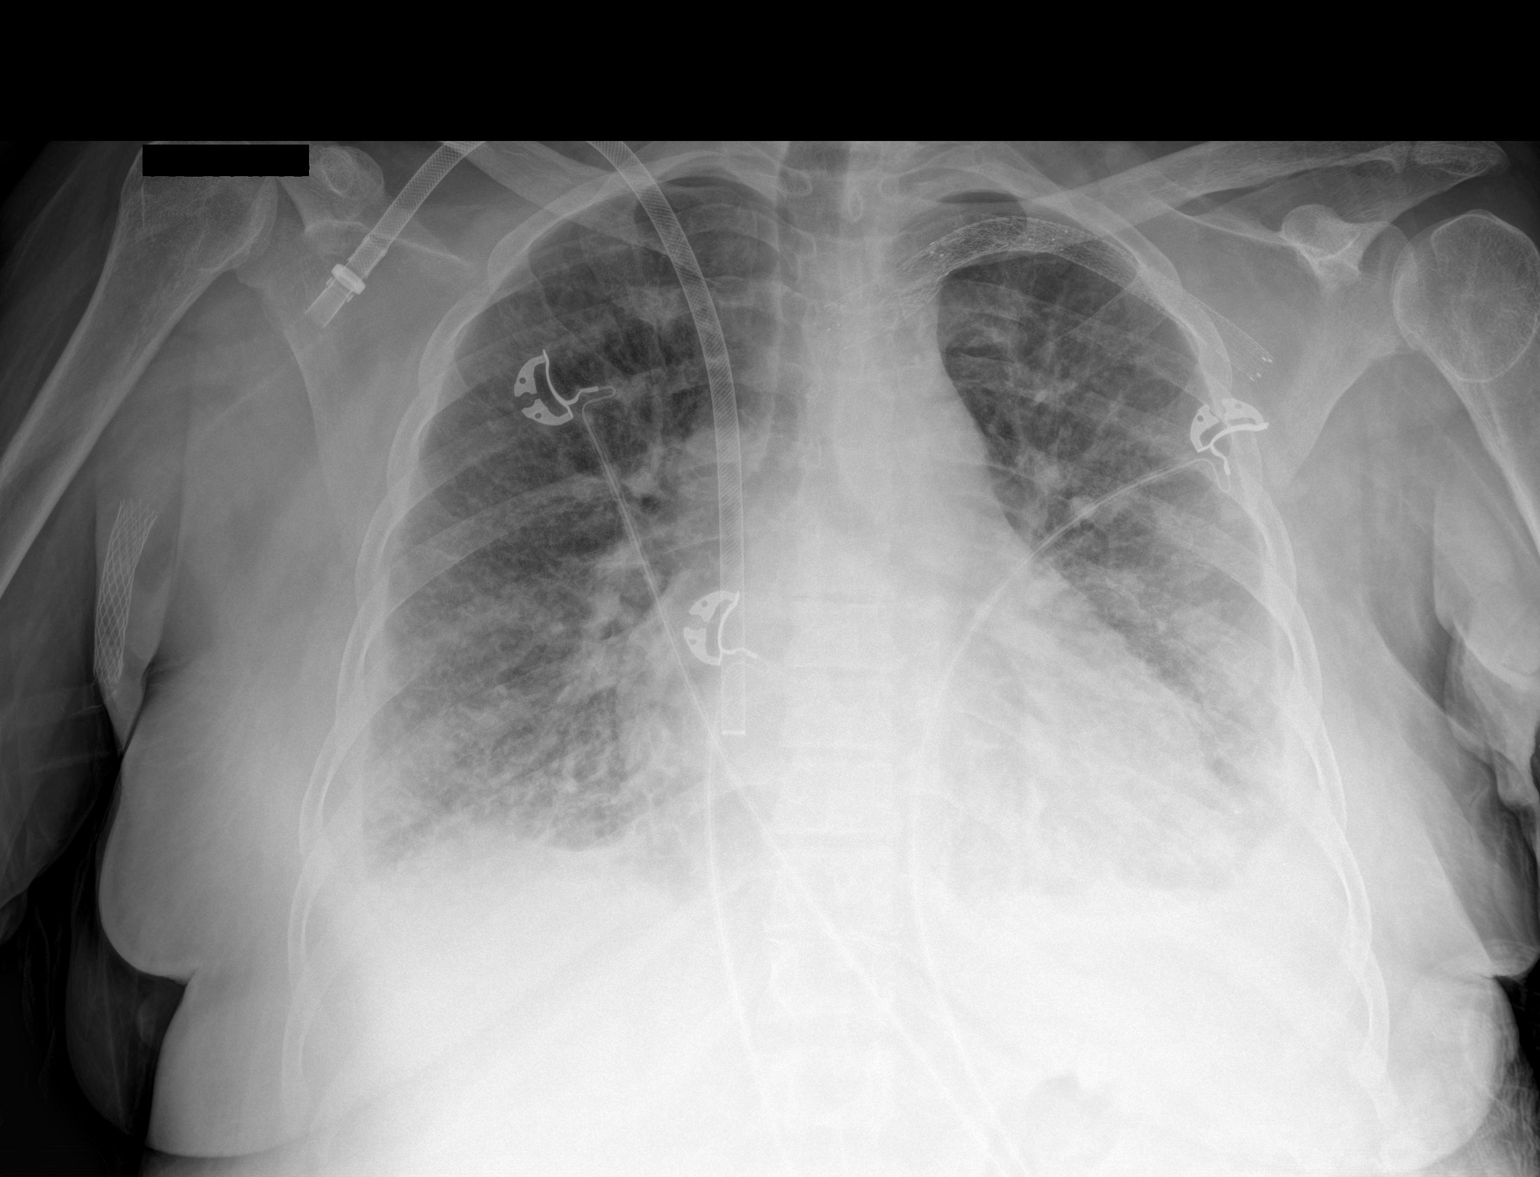

[1 of 1 positions shown; findings below may reference images not displayed]

FINDINGS: Bilateral pleural thickening and small basilar effusions. Mixed
interstitial and patchy opacities towards the mid to lower lungs
likely reflecting a combination of interstitial and developing
alveolar edema with central cuffing, vascular cephalization and
congestion. Cardiomegaly is similar to priors. Evidence of prior
bilateral vascular stenting in the left brachiocephalic/subclavian
vein and right upper arm. A right upper extremity HeRO graft is also
noted, terminating at the level the right atrium. Removal of a right
lower extremity central venous catheter. Telemetry leads overlie the
chest. Mild body wall edema. No other acute or suspicious soft
tissue or osseous abnormality. Degenerative changes are present in
the imaged spine and shoulders.
IMPRESSION: Features of CHF/volume overload with cardiomegaly, edema and small
bilateral effusions.

More patchy opacities could reflect alveolar edema versus underlying
infection.

## 2020-10-09 DIAGNOSIS — G0491 Myelitis, unspecified: Secondary | ICD-10-CM | POA: Diagnosis not present

## 2020-10-09 DIAGNOSIS — Z992 Dependence on renal dialysis: Secondary | ICD-10-CM | POA: Diagnosis not present

## 2020-10-09 DIAGNOSIS — N186 End stage renal disease: Secondary | ICD-10-CM | POA: Diagnosis not present

## 2020-10-10 ENCOUNTER — Other Ambulatory Visit: Payer: Medicaid Other | Admitting: Primary Care

## 2020-10-10 ENCOUNTER — Telehealth (INDEPENDENT_AMBULATORY_CARE_PROVIDER_SITE_OTHER): Payer: Self-pay

## 2020-10-10 ENCOUNTER — Other Ambulatory Visit: Payer: Self-pay

## 2020-10-10 DIAGNOSIS — I823 Embolism and thrombosis of renal vein: Secondary | ICD-10-CM | POA: Diagnosis not present

## 2020-10-10 DIAGNOSIS — D631 Anemia in chronic kidney disease: Secondary | ICD-10-CM | POA: Diagnosis not present

## 2020-10-10 DIAGNOSIS — D649 Anemia, unspecified: Secondary | ICD-10-CM

## 2020-10-10 DIAGNOSIS — N186 End stage renal disease: Secondary | ICD-10-CM

## 2020-10-10 DIAGNOSIS — I2510 Atherosclerotic heart disease of native coronary artery without angina pectoris: Secondary | ICD-10-CM

## 2020-10-10 DIAGNOSIS — I739 Peripheral vascular disease, unspecified: Secondary | ICD-10-CM

## 2020-10-10 DIAGNOSIS — I132 Hypertensive heart and chronic kidney disease with heart failure and with stage 5 chronic kidney disease, or end stage renal disease: Secondary | ICD-10-CM | POA: Diagnosis not present

## 2020-10-10 DIAGNOSIS — J449 Chronic obstructive pulmonary disease, unspecified: Secondary | ICD-10-CM | POA: Diagnosis not present

## 2020-10-10 DIAGNOSIS — M4624 Osteomyelitis of vertebra, thoracic region: Secondary | ICD-10-CM | POA: Diagnosis not present

## 2020-10-10 DIAGNOSIS — E1022 Type 1 diabetes mellitus with diabetic chronic kidney disease: Secondary | ICD-10-CM

## 2020-10-10 DIAGNOSIS — Z992 Dependence on renal dialysis: Secondary | ICD-10-CM

## 2020-10-10 DIAGNOSIS — I5030 Unspecified diastolic (congestive) heart failure: Secondary | ICD-10-CM | POA: Diagnosis not present

## 2020-10-10 DIAGNOSIS — Z515 Encounter for palliative care: Secondary | ICD-10-CM

## 2020-10-10 DIAGNOSIS — F32A Depression, unspecified: Secondary | ICD-10-CM | POA: Diagnosis not present

## 2020-10-10 NOTE — Telephone Encounter (Signed)
Patient is schedule bilateral upper extremity angio and bilateral upper venogram on 1/26 and will need to be reschedule to 1/25 due to Dr Delana Meyer being off. Patient was made aware and will like to move procedure to 2/4 due to transportation and dialysis. Procedure has been reschedule for 2/4 at the arrival time 9 am and Covid testing to 2/2 between 8-1pm.

## 2020-10-10 NOTE — Progress Notes (Signed)
[name] Consult Note Telephone: [phone]  Fax: [phone]     Date of encounter: 10/10/20 PATIENT NAME: [name] [address] [place] [address] [phone] (home)  DOB: 1961/01/06 MRN: [id number]  PRIMARY CARE PROVIDER:    Denton Lank, MD,  [place]. [name] [place] [address] [phone]  REFERRING PROVIDER:   Denton Lank, MD [address] [place],  [place] [address] [phone]  RESPONSIBLE PARTY:   Extended Emergency Contact Information Primary Emergency Contact: [name] Address: [address]          [place], [place] [address] [place] Phone: [phone] Relation: Son  I met face to face with patient in  Home. Palliative Care was asked to follow this patient by consultation request of Robin Lank, MD to help address advance care planning and goals of care. This is initial visit.   ASSESSMENT AND RECOMMENDATIONS:   1. Advance Care Planning/Goals of Care: Goals include to maximize quality of life and symptom management. Our advance care planning conversation included a discussion about:     The value and importance of advance care planning   Exploration of personal, cultural or spiritual beliefs that might influence medical decisions   Exploration of goals of care in the event of a sudden injury or illness   Identification and preparation of a healthcare agent -Son  Review of an  advance directive document- MOST and 5 wishes left.  I met with new patient in her home. We explored her current care goals. She outlined her recent hospitalizations and medical history. She has had multiple hospitalizations and increasing debility from which she has not recovered. 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 She states that her son is her spokes  person. We touched on some advance care directive.  I left the five wishes and most form for her review. We can discuss on our next visit.   2. Face to Face for DME: I met with patient face to face to assess for DME  Needs semi electric hospital bed :  1. Patient has a medical condition which requires positioning of the body in ways not feasible with an ordinary bed. Has edema non pitting (R60.9) to thighs bil due to CHF (I50. 9)  2.  The patient requires positioning of the body in ways not feasible with an ordinary bed in order to alleviate pain in LE bilateral 3. The patient requires the head of the bed to be elevated more than 30 degrees most of the time due to CHF, COPD  (J44.9)and  problems with aspiration. Pillows are not sufficient.  4. Additionally patient requires frequent changes in body Position for pain relief and/or has an immediate need for a change in body Position due to transferring from bed to standing without an assistant present.  3. Symptom Management: .   Edema: She voiced  some concerns about edema. She states that this is impacting her ability to ambulate. She uses a cane or walker but has a great deal of difficulty rising from sitting even with an electric lift chair, and difficulty walking due to debility and deconditioning and edema. She states she is trying to wrap her own  legs to apply compression for edema but that the wraps often fall off. She also is unable  to elevate her lower extremities sufficiently  at hs in her lift recliner where she sleeps.  Mobility: She is known to home health and is requesting a hospital bed. Upon observing her arise, it is evident that she needs help with position changes and transfers. She is  living alone and is not able to change position safely (I). She must sleep in semi Fowlers in order to accommodate her orthopnea from her congestive failure.  4. Follow up Palliative Care Visit: Palliative care will continue to follow for  goals of care clarification and symptom management. Return 4 weeks or prn.I put a call out to her home health nurse in order to discuss some of her upcoming needs and home health care plan. I plan to see her roughly monthly unless needed prior. Meanwhile I will endeavor to obtain the hospital bed.   5. Family /Caregiver/Community Supports: Lives alone, family supportive nearby in town. Has limited support at home.Would benefit from [facility] worker. Goes to HD and has transportation problems.  6. Cognitive / Functional decline: A and o x 3, able to ambulate, do adls with frequent rest periods.  I spent 75 minutes providing this consultation,  from 1300 to 1415. More than 50% of the time in this consultation was spent coordinating communication.   CODE STATUS: TBD, none on file, FULL at present  PPS: 40%  HOSPICE ELIGIBILITY/DIAGNOSIS: no  Subjective:  CHIEF COMPLAINT: debility, ambulation/ataxia, care needs.  HISTORY OF PRESENT ILLNESS:  [name] is a 60 y.o. year old female  with ESRD on HD, anemia, DM Type 1,  CAD, CHF, PVD, HTN, TIA, COPD, MDD with recurrent psychosis,   We are asked to consult around advance care planning and complex medical decision making.    Review and summarization of old Epic records shows or history from other than patient. Review or lab tests, radiology,  or medicine Hga1c, 4.1, hyponatremia, creatinine 4.79, GFR 8-10.  History obtained from review of EMR, discussion with primary team, and  interview with family, caregiver  and/or [name]. Records reviewed and summarized above.   CURRENT PROBLEM LIST:  Patient Active Problem List   Diagnosis Date Noted  . Bradycardia 09/17/2020  . Advanced care planning/counseling discussion   . Discitis of thoracic region   . Fluid overload 08/10/2020  . HLD (hyperlipidemia) 08/10/2020  . Chest pain 08/10/2020  . CAD (coronary artery disease) 08/10/2020  . GERD (gastroesophageal reflux disease) 08/10/2020  . Obesity  (BMI 30-39.9) 06/06/2020  . Anemia in ESRD (end-stage renal disease) ([place]) 05/31/2020  . Type 1 diabetes mellitus ([place]) 05/31/2020  . End-stage renal disease ([place] [facility] Consult Note Telephone: [phone]  Fax: [phone]     Date of encounter: 10/10/20 PATIENT NAME: [name] [address] [place] [address] [phone] (home)  DOB: 1961/01/06 MRN: [id number]  PRIMARY CARE PROVIDER:    Denton Lank, MD,  [place]. [name] [place] [address] [phone]  REFERRING PROVIDER:   Denton Lank, MD [address] [place],  [place] [address] [phone]  RESPONSIBLE PARTY:   Extended Emergency Contact Information Primary Emergency Contact: [name] Address: [address]          [place], [place] [address] [place] Phone: [phone] Relation: Son  I met face to face with patient in  Home. Palliative Care was asked to follow this patient by consultation request of Robin Lank, MD to help address advance care planning and goals of care. This is initial visit.   ASSESSMENT AND RECOMMENDATIONS:   1. Advance Care Planning/Goals of Care: Goals include to maximize quality of life and symptom management. Our advance care planning conversation included a discussion about:     The value and importance of advance care planning   Exploration of personal, cultural or spiritual beliefs that might influence medical decisions   Exploration of goals of care in the event of a sudden injury or illness   Identification and preparation of a healthcare agent -Son  Review of an  advance directive document- MOST and 5 wishes left.  I met with new patient in her home. We explored her current care goals. She outlined her recent hospitalizations and medical history. She has had multiple hospitalizations and increasing debility from which she has not recovered. 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 She states that her son is her spokes  person. We touched on some advance care directive.  I left the five wishes and most form for her review. We can discuss on our next visit.   2. Face to Face for DME: I met with patient face to face to assess for DME  Needs semi electric hospital bed :  1. Patient has a medical condition which requires positioning of the body in ways not feasible with an ordinary bed. Has edema non pitting (R60.9) to thighs bil due to CHF (I50. 9)  2.  The patient requires positioning of the body in ways not feasible with an ordinary bed in order to alleviate pain in LE bilateral 3. The patient requires the head of the bed to be elevated more than 30 degrees most of the time due to CHF, COPD  (J44.9)and  problems with aspiration. Pillows are not sufficient.  4. Additionally patient requires frequent changes in body Position for pain relief and/or has an immediate need for a change in body Position due to transferring from bed to standing without an assistant present.  3. Symptom Management: .   Edema: She voiced  some concerns about edema. She states that this is impacting her ability to ambulate. She uses a cane or walker but has a great deal of difficulty rising from sitting even with an electric lift chair, and difficulty walking due to debility and deconditioning and edema. She states she is trying to wrap her own  legs to apply compression for edema but that the wraps often fall off. She also is unable  to elevate her lower extremities sufficiently  at hs in her lift recliner where she sleeps.  Mobility: She is known to home health and is requesting a hospital bed. Upon observing her arise, it is evident that she needs help with position changes and transfers. She is  living alone and is not able to change position safely (I). She must sleep in semi Fowlers in order to accommodate her orthopnea from her congestive failure.  4. Follow up Palliative Care Visit: Palliative care will continue to follow for  goals of care clarification and symptom management. Return 4 weeks or prn.I put a call out to her home health nurse in order to discuss some of her upcoming needs and home health care plan. I plan to see her roughly monthly unless needed prior. Meanwhile I will endeavor to obtain the hospital bed.   5. Family /Caregiver/Community Supports: Lives alone, family supportive nearby in town. Has limited support at home.Would benefit from [facility] worker. Goes to HD and has transportation problems.  6. Cognitive / Functional decline: A and o x 3, able to ambulate, do adls with frequent rest periods.  I spent 75 minutes providing this consultation,  from 1300 to 1415. More than 50% of the time in this consultation was spent coordinating communication.   CODE STATUS: TBD, none on file, FULL at present  PPS: 40%  HOSPICE ELIGIBILITY/DIAGNOSIS: no  Subjective:  CHIEF COMPLAINT: debility, ambulation/ataxia, care needs.  HISTORY OF PRESENT ILLNESS:  [name] is a 60 y.o. year old female  with ESRD on HD, anemia, DM Type 1,  CAD, CHF, PVD, HTN, TIA, COPD, MDD with recurrent psychosis,   We are asked to consult around advance care planning and complex medical decision making.    Review and summarization of old Epic records shows or history from other than patient. Review or lab tests, radiology,  or medicine Hga1c, 4.1, hyponatremia, creatinine 4.79, GFR 8-10.  History obtained from review of EMR, discussion with primary team, and  interview with family, caregiver  and/or [name]. Records reviewed and summarized above.   CURRENT PROBLEM LIST:  Patient Active Problem List   Diagnosis Date Noted  . Bradycardia 09/17/2020  . Advanced care planning/counseling discussion   . Discitis of thoracic region   . Fluid overload 08/10/2020  . HLD (hyperlipidemia) 08/10/2020  . Chest pain 08/10/2020  . CAD (coronary artery disease) 08/10/2020  . GERD (gastroesophageal reflux disease) 08/10/2020  . Obesity  (BMI 30-39.9) 06/06/2020  . Anemia in ESRD (end-stage renal disease) ([place]) 05/31/2020  . Type 1 diabetes mellitus ([place]) 05/31/2020  . End-stage renal disease ([place]) 05/31/2020  . Hypertensive disorder 05/31/2020  . Arm pain 05/21/2020  . Cellulitis of right leg 05/02/2020  . ESRD (end stage renal disease) ([place]) 04/13/2020  . SOBOE (shortness of breath on exertion) 02/08/2020  . Hematoma of arm, left, subsequent encounter 11/24/2019  . Respiratory failure ([place])   . Shortness of breath   . Complication of arteriovenous dialysis fistula 11/13/2019  . PVD (peripheral vascular disease) ([place]) 11/13/2019  . Severe anemia 11/13/2019  . Pressure injury of skin 11/13/2019  . ESRD on hemodialysis ([place])   . Hemorrhagic shock ([place]) 11/11/2019  . Leg pain 10/02/2019  . CAP (community acquired pneumonia) 10/01/2019  . Atherosclerosis of native arteries of extremity with intermittent claudication ([place]) 05/01/2019  . Right-sided headache   . COPD (chronic obstructive pulmonary disease) ([place]) 10/06/2018  . Syncope 10/04/2018  . Symptomatic anemia 06/18/2018  . Gastroparesis due  to DM ([place]) 01/18/2018  . Complication of vascular access for dialysis 12/04/2017  . Osteomyelitis ([place]) 09/30/2017  . Carotid stenosis 06/18/2017  . Bilateral carotid artery stenosis 06/16/2017  . Benign essential HTN 05/19/2017  . CKD (chronic kidney disease) stage 5, GFR less than 15 ml/min (HCC) 05/19/2017  . Hyperlipidemia, mixed 05/19/2017  . Shortness of breath 05/04/2017  . Rash, skin 12/16/2016  . Cellulitis of lower extremity 07/29/2016  . Chronic venous insufficiency 07/29/2016  . Lymphedema 07/29/2016  . TIA (transient ischemic attack) 04/21/2016  . Altered mental status 04/08/2016  . Hyperammonemia ([place]) 04/08/2016  . Elevated troponin 04/08/2016  . Depression 04/08/2016  . Depression, major, recurrent, severe with psychosis ([place]) 04/08/2016  . Blood in stool   . Intractable cyclical vomiting with nausea    . Reflux esophagitis   . Gastritis   . Generalized abdominal pain   . Uncontrollable vomiting   . Major depressive disorder, recurrent episode, moderate ([place]) 03/15/2016  . Adjustment disorder with mixed anxiety and depressed mood 03/15/2016  . Malnutrition of moderate degree 12/01/2015  . Renal mass   . Dyspnea   . Acute renal failure ([place])   . Respiratory failure ([place])   . High temperature 11/14/2015  . Encounter for central line placement   . Encounter for orogastric (OG) tube placement   . Nausea 11/12/2015  . Hyperkalemia 10/03/2015  . Diarrhea, unspecified 07/22/2015  . Pneumonia 05/21/2015  . Hypoglycemia 04/24/2015  . Unresponsiveness 04/24/2015  . Sinus bradycardia 04/24/2015  . Hypothermia 04/24/2015  . Acute respiratory failure ([place]) 04/24/2015  . Acute on chronic diastolic CHF (congestive heart failure) ([place]) 04/05/2015  . Diabetic gastroparesis ([place]) 04/05/2015  . Hypokalemia 04/05/2015  . Generalized weakness 04/05/2015  . Acute pulmonary edema ([place]) 04/03/2015  . Nausea and vomiting 04/03/2015  . Hypoglycemia associated with diabetes ([place]) 04/03/2015  . Anemia of chronic disease 04/03/2015  . Secondary hyperparathyroidism ([place]) 04/03/2015  . Pressure ulcer 04/02/2015  . Acute respiratory failure with hypoxia ([place]) 04/01/2015  . Adjustment disorder with anxiety 03/14/2015  . Somatic symptom disorder, mild 03/08/2015  . Coronary artery disease involving native coronary artery of native heart without angina pectoris   . Nausea & vomiting 03/06/2015  . Abdominal pain 03/06/2015  . Type 2 diabetes mellitus with hypoglycemia without coma, without long-term current use of insulin ([place]) 03/06/2015  . HTN (hypertension) 03/06/2015  . Gastroparesis 02/24/2015  . Pleural effusion 02/19/2015  . Bacteremia due to Enterococcus 02/19/2015  . End-stage renal disease on hemodialysis ([place]) 02/19/2015  . Diarrhea 08/01/2014   PAST MEDICAL HISTORY:  Active Ambulatory  Problems    Diagnosis Date Noted  . Pleural effusion 02/19/2015  . Bacteremia due to Enterococcus 02/19/2015  . End-stage renal disease on hemodialysis ([place]) 02/19/2015  . Gastroparesis 02/24/2015  . Nausea & vomiting 03/06/2015  . Abdominal pain 03/06/2015  . Type 2 diabetes mellitus with hypoglycemia without coma, without long-term current use of insulin ([place]) 03/06/2015  . HTN (hypertension) 03/06/2015  . Coronary artery disease involving native coronary artery of native heart without angina pectoris   . Somatic symptom disorder, mild 03/08/2015  . Adjustment disorder with anxiety 03/14/2015  . Acute respiratory failure with hypoxia ([place]) 04/01/2015  . Pressure ulcer 04/02/2015  . Acute pulmonary edema ([place]) 04/03/2015  . Nausea and vomiting 04/03/2015  . Hypoglycemia associated with diabetes ([place]) 04/03/2015  . Anemia of chronic disease 04/03/2015  . Secondary hyperparathyroidism ([place]) 04/03/2015  . Acute on chronic diastolic CHF (congestive heart failure) (  [place]) 04/05/2015  . Diabetic gastroparesis ([place]) 04/05/2015  . Hypokalemia 04/05/2015  . Generalized weakness 04/05/2015  . Hypoglycemia 04/24/2015  . Unresponsiveness 04/24/2015  . Sinus bradycardia 04/24/2015  . Hypothermia 04/24/2015  . Acute respiratory failure ([place]) 04/24/2015  . Pneumonia 05/21/2015  . Diarrhea, unspecified 07/22/2015  . Hyperkalemia 10/03/2015  . Nausea 11/12/2015  . High temperature 11/14/2015  . Encounter for central line placement   . Encounter for orogastric (OG) tube placement   . Acute renal failure ([place])   . Respiratory failure ([place])   . Renal mass   . Dyspnea   . Malnutrition of moderate degree 12/01/2015  . Major depressive disorder, recurrent episode, moderate ([place]) 03/15/2016  . Adjustment disorder with mixed anxiety and depressed mood 03/15/2016  . Generalized abdominal pain   . Uncontrollable vomiting   . Blood in stool   . Intractable cyclical vomiting with nausea   . Reflux  esophagitis   . Gastritis   . Altered mental status 04/08/2016  . Hyperammonemia ([place]) 04/08/2016  . Elevated troponin 04/08/2016  . Depression 04/08/2016  . Depression, major, recurrent, severe with psychosis ([place]) 04/08/2016  . TIA (transient ischemic attack) 04/21/2016  . Cellulitis of lower extremity 07/29/2016  . Chronic venous insufficiency 07/29/2016  . Lymphedema 07/29/2016  . Shortness of breath 05/04/2017  . Carotid stenosis 06/18/2017  . Osteomyelitis ([place]) 09/30/2017  . Complication of vascular access for dialysis 12/04/2017  . Gastroparesis due to DM ([place]) 01/18/2018  . Symptomatic anemia 06/18/2018  . Syncope 10/04/2018  . COPD (chronic obstructive pulmonary disease) ([place]) 10/06/2018  . Right-sided headache   . Atherosclerosis of native arteries of extremity with intermittent claudication ([place]) 05/01/2019  . CAP (community acquired pneumonia) 10/01/2019  . Leg pain 10/02/2019  . Hemorrhagic shock ([place]) 11/11/2019  . ESRD on hemodialysis ([place])   . Complication of arteriovenous dialysis fistula 11/13/2019  . PVD (peripheral vascular disease) ([place]) 11/13/2019  . Severe anemia 11/13/2019  . Pressure injury of skin 11/13/2019  . Shortness of breath   . Respiratory failure ([place])   . Hematoma of arm, left, subsequent encounter 11/24/2019  . ESRD (end stage renal disease) ([place]) 04/13/2020  . Cellulitis of right leg 05/02/2020  . Benign essential HTN 05/19/2017  . Bilateral carotid artery stenosis 06/16/2017  . CKD (chronic kidney disease) stage 5, GFR less than 15 ml/min (HCC) 05/19/2017  . Hyperlipidemia, mixed 05/19/2017  . Rash, skin 12/16/2016  . Arm pain 05/21/2020  . Anemia in ESRD (end-stage renal disease) ([place]) 05/31/2020  . Diarrhea 08/01/2014  . Type 1 diabetes mellitus ([place]) 05/31/2020  . SOBOE (shortness of breath on exertion) 02/08/2020  . End-stage renal disease ([place]) 05/31/2020  . Hypertensive disorder 05/31/2020  . Obesity (BMI 30-39.9) 06/06/2020   . Fluid overload 08/10/2020  . HLD (hyperlipidemia) 08/10/2020  . Chest pain 08/10/2020  . CAD (coronary artery disease) 08/10/2020  . GERD (gastroesophageal reflux disease) 08/10/2020  . Discitis of thoracic region   . Advanced care planning/counseling discussion   . Bradycardia 09/17/2020   Resolved Ambulatory Problems    Diagnosis Date Noted  . No Resolved Ambulatory Problems   Past Medical History:  Diagnosis Date  . Anemia   . Anginal pain ([place])   . Anxiety   . Arthritis   . Asthma   . Broken wrist   . Bronchitis   . chronic diastolic CHF 7/34/1937  . Chronic kidney disease   . Coronary artery disease   . Diabetes mellitus ([place])   .  Diabetes mellitus without complication ([place])   . Diabetic neuropathy ([place])   . dialysis 2006  . Diverticulosis   . Dizziness   . Elevated lipids   . Environmental and seasonal allergies   . ESRD (end stage renal disease) on dialysis ([place])   . Headache   . History of anemia due to chronic kidney disease   . History of hiatal hernia   . HOH (hard of hearing)   . Hx of pancreatitis 2015  . Hypertension   . Lower extremity edema   . Mitral regurgitation   . Myocardial infarction ([place])   . Orthopnea   . Parathyroid abnormality ([place])   . Peripheral arterial disease ([place])   . Renal cancer ([place])   . Renal insufficiency   . Wheezing    SOCIAL HX:  Social History   Tobacco Use  . Smoking status: Never Smoker  . Smokeless tobacco: Never Used  Substance Use Topics  . Alcohol use: Not Currently    Comment: glass wine week per pt   FAMILY HX:  Family History  Problem Relation Age of Onset  . Kidney disease Mother   . Diabetes Mother   . Cancer Father   . Kidney disease Sister       ALLERGIES:  Allergies  Allergen Reactions  . Ace Inhibitors Swelling and Anaphylaxis  . Ativan [Lorazepam] Other (See Comments)    Reaction:Hallucinations and headaches  . Compazine [Prochlorperazine Edisylate] Anaphylaxis, Nausea And  Vomiting and Other (See Comments)    Other reaction(s): dystonia from this vs. Reglan, 23 Jul - patient relates that she takes promethazine frequently with no problems  . Sumatriptan Succinate Other (See Comments)    Other reaction(s): delirium and hallucinations per [facility] records  . Zofran [Ondansetron] Nausea And Vomiting    Per pt. she is allergic to zofran or will experience adverse reaction like hallucination   . Losartan Nausea Only  . Prochlorperazine Other (See Comments)    Reaction:  Unknown . Patient does not remember reaction but she does have vertigo and anxiety along with n and v at times. Could be used to treat any of these   . Reglan [Metoclopramide] Other (See Comments)    Per patient her Dr. Evelina Bucy her off it   . Scopolamine Other (See Comments)    Dizziness, also has vertigo already  . Tape Rash    Plastic tape causes rash  . Tapentadol Rash     PERTINENT MEDICATIONS:  Outpatient Encounter Medications as of 10/10/2020  Medication Sig  . albuterol (PROVENTIL) (2.5 MG/3ML) 0.083% nebulizer solution Take 2.5 mg by nebulization every 4 (four) hours as needed for wheezing or shortness of breath.  Marland Kitchen albuterol (VENTOLIN HFA) 108 (90 Base) MCG/ACT inhaler Inhale 2 puffs into the lungs every 4 (four) hours as needed for wheezing or shortness of breath.  Marland Kitchen alum & mag hydroxide-simeth (MAALOX/MYLANTA) 200-200-20 MG/5ML suspension Take 15 mLs by mouth every 6 (six) hours as needed for indigestion or heartburn.  Marland Kitchen amLODipine (NORVASC) 10 MG tablet Take 1 tablet (10 mg total) by mouth daily.  Marland Kitchen ammonium lactate (LAC-HYDRIN) 12 % lotion Apply 1 application topically 2 (two) times daily as needed for dry skin.   . Ascorbic Acid (VITA-C PO) Take 1 tablet by mouth daily.  Marland Kitchen aspirin EC 81 MG tablet Take 81 mg by mouth daily.  Marland Kitchen atorvastatin (LIPITOR) 20 MG tablet Take 20 mg by mouth every evening.   . calcium acetate (PHOSLO) 667 MG capsule Take by  mouth.  . calcium carbonate (TUMS - DOSED  IN MG ELEMENTAL CALCIUM) 500 MG chewable tablet Chew 2.5 tablets (500 mg of elemental calcium total) by mouth every 6 (six) hours as needed for indigestion.  . cefTAZidime (FORTAZ) IVPB Inject 1 g into the vein every Monday, Wednesday, and Friday with hemodialysis. Indication: Thoracic vertebral osteomyelitis with disciits First Dose: Yes Last Day of Therapy:  10/10/20 Labs - Once weekly:  CBC/D, CMP, ESR, vancomycin trough   Method of administration may be changed at the discretion of home infusion pharmacist based upon assessment of the patient and/or caregiver's ability to self-administer the medication ordered. (Patient taking differently: Inject 1 g into the vein Every Tuesday,Thursday,and Saturday with dialysis. Indication: Thoracic vertebral osteomyelitis with disciits First Dose: Yes Last Day of Therapy:  10/10/20 Labs - Once weekly:  CBC/D, CMP, ESR, vancomycin trough   Method of administration may be changed at the discretion of home infusion pharmacist based upon assessment of the patient and/or caregiver's ability to self-administer the medication ordered.)  . Cholecalciferol (VITAMIN D3) 25 MCG (1000 UT) CAPS Take 1,000 Units by mouth daily.   . cholestyramine (QUESTRAN) 4 g packet Take 4 g by mouth 2 (two) times daily.  . cholestyramine light (PREVALITE) 4 g packet Take 4 g by mouth 2 (two) times daily.  Marland Kitchen CREON 12000 units CPEP capsule Take 36,000 Units by mouth 3 (three) times daily before meals.   . cyanocobalamin 1000 MCG tablet Take 1,000 mcg by mouth daily.   . diphenhydrAMINE (BENADRYL) 25 MG tablet Take 25 mg by mouth every 6 (six) hours as needed for itching.  Marland Kitchen epoetin alfa (EPOGEN) 10000 UNIT/ML injection Inject 1 mL (10,000 Units total) into the vein every Monday, Wednesday, and Friday with hemodialysis.  . feeding supplement, ENSURE ENLIVE, (ENSURE ENLIVE) LIQD Take 237 mLs by mouth daily.  . Ferrous Sulfate (IRON) 325 (65 Fe) MG TABS Take 325 mg by mouth daily.   .  furosemide (LASIX) 80 MG tablet Take 80 mg by mouth daily.  . furosemide (LASIX) 80 MG tablet Take 1 tablet by mouth daily.  Marland Kitchen gabapentin (NEURONTIN) 100 MG capsule Take 100 mg by mouth every evening.   . hydrALAZINE (APRESOLINE) 50 MG tablet Take 1 tablet (50 mg total) by mouth every 8 (eight) hours.  Marland Kitchen HYDROcodone-acetaminophen (NORCO/VICODIN) 5-325 MG tablet Take 1 tablet by mouth 3 (three) times daily as needed for moderate pain or severe pain.  . hydrOXYzine (ATARAX/VISTARIL) 25 MG tablet Take 25 mg by mouth daily.   . megestrol (MEGACE) 400 MG/10ML suspension Take 10 mLs (400 mg total) by mouth daily.  . multivitamin (RENA-VIT) TABS tablet Take 1 tablet by mouth daily.   . mupirocin ointment (BACTROBAN) 2 % Apply 1 application topically daily as needed (leg rash).   . nitroGLYCERIN (NITROSTAT) 0.4 MG SL tablet Place 0.4 mg under the tongue every 5 (five) minutes as needed for chest pain.   . Omega-3 300 MG CAPS Take 300 mg by mouth 2 (two) times daily. Every other day opposite omega XL  . ondansetron (ZOFRAN) 4 MG tablet Take 1 tablet (4 mg total) by mouth every 8 (eight) hours as needed for nausea or vomiting.  . pantoprazole (PROTONIX) 40 MG tablet Take 40 mg by mouth daily.  . polyethylene glycol (MIRALAX / GLYCOLAX) 17 g packet Take 17 g by mouth daily.  . Probiotic Product (PROBIOTIC PO) Take 1 capsule by mouth daily.  . promethazine (PHENERGAN) 25 MG tablet Take 1 tablet (  25 mg total) by mouth every 6 (six) hours as needed for nausea or vomiting.  . sevelamer carbonate (RENVELA) 800 MG tablet Take 800 mg by mouth 4 (four) times daily. (at meals and with a snack)  . vancomycin IVPB Inject 1,000 mg into the vein every Monday, Wednesday, and Friday with hemodialysis. Indication:  Thoracic vertebral osteomyelitis with disciits First Dose: Yes Last Day of Therapy:  10/10/20 Labs - Weekly:  CBC/D, CMP, vancomycin trough, ESR Method of administration:Elastomeric Method of administration may  be changed at the discretion of the patient and/or caregiver's ability to self-administer the medication ordered. (Patient taking differently: Inject 1,000 mg into the vein Every Tuesday,Thursday,and Saturday with dialysis. Indication:  Thoracic vertebral osteomyelitis with disciits First Dose: Yes Last Day of Therapy:  10/10/20 Labs - Weekly:  CBC/D, CMP, vancomycin trough, ESR Method of administration:Elastomeric Method of administration may be changed at the discretion of the patient and/or caregiver's ability to self-administer the medication ordered.)   No facility-administered encounter medications on file as of 10/10/2020.    Objective: ROS  General: NAD EYES: denies vision changes ENMT: denies dysphagia Cardiovascular: denies chest pain, endorses edema Pulmonary: denies cough, endorses  increased SOB, endorses 3 pillow orthopnea Abdomen: endorses fair to good appetite, denies constipation, endorses continence of bowel GU: denies dysuria, endorses continence of urine MSK:  endorses ROM limitations, no recent fall reported Skin: denies rashes or wounds Neurological: endorses increased weakness,endorse pain with movement, endorses  insomnia Psych: Endorses positive mood Heme/lymph/immuno: denies bruises, abnormal bleeding  Physical Exam: Current and past weights:  175 lbs reported, BMI 29.1 Constitutional:  NAD General: frail appearing, overweight, fluid contributes EYES: anicteric sclera,lids intact, no discharge  ENMT: intact hearing,oral mucous membranes moist, dentition intact CV: , +++ LE edema to thights Pulmonary: slight  increased work of breathing, no cough, no audible wheezes, room air Abdomen: intake 75%,  no ascites GU: deferred MSK: mod sarcopenia, decreased ROM in all extremities,  Ambulatory with device Skin: warm and dry, no rashes or wounds on visible skin Neuro: Generalized weakness, no cognitive impairment Psych: slightly anxious affect, A and O x  3 Hem/lymph/immuno: no widespread bruising   Thank you for the opportunity to participate in the care of [name].  The palliative care team will continue to follow. Please call our office at [phone] if we can be of additional assistance.  Jason Coop, NP , DNP, MPH, AGPCNP-BC, ACHPN  COVID-19 PATIENT SCREENING TOOL  Person answering questions: ____________self______ _____   1.  Is the patient or any family member in the home showing any signs or symptoms regarding respiratory infection?               Person with Symptom- __________NA_________________  a. Fever                                                                          Yes___ No___          ___________________  b. Shortness of breath  Yes___ No___          ___________________ c. Cough/congestion                                       Yes___  No___         ___________________ d. Body aches/pains                                                         Yes___ No___        ____________________ e. Gastrointestinal symptoms (diarrhea, nausea)           Yes___ No___        ____________________  2. Within the past 14 days, has anyone living in the home had any contact with someone with or under investigation for COVID-19?    Yes___ No_X_   Person __________________   

## 2020-10-11 ENCOUNTER — Encounter (INDEPENDENT_AMBULATORY_CARE_PROVIDER_SITE_OTHER): Payer: Self-pay

## 2020-10-11 DIAGNOSIS — Z79899 Other long term (current) drug therapy: Secondary | ICD-10-CM | POA: Diagnosis not present

## 2020-10-11 DIAGNOSIS — G0491 Myelitis, unspecified: Secondary | ICD-10-CM | POA: Diagnosis not present

## 2020-10-11 DIAGNOSIS — Z992 Dependence on renal dialysis: Secondary | ICD-10-CM | POA: Diagnosis not present

## 2020-10-11 DIAGNOSIS — N186 End stage renal disease: Secondary | ICD-10-CM | POA: Diagnosis not present

## 2020-10-11 IMAGING — NM NM PULMONARY PERF PARTICULATE
1 series · 8 of 8 positions shown · non-contrast
Comparison: None. Findings are correlated with concurrently
performed chest radiograph

CLINICAL DATA: Left chest pain

EXAM:
NUCLEAR MEDICINE PERFUSION LUNG SCAN
TECHNIQUE: Perfusion images were obtained in multiple projections after
intravenous injection of radiopharmaceutical.
Ventilation scans intentionally deferred if perfusion scan and chest
x-ray adequate for interpretation during COVID 19 epidemic.
RADIOPHARMACEUTICALS:  4.724 mCi Fc-XXm MAA IV

[Series 1000: lung perfusion · 1.65mm/px · 4 acquisitions, 8 frames shown]
[im 1/4]
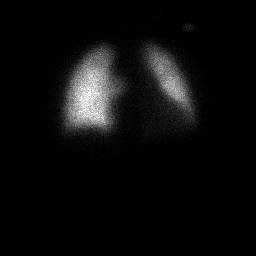
[im 1/4]
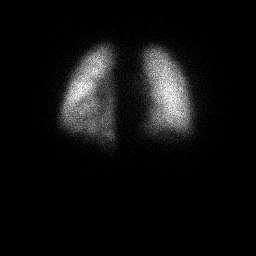
[im 2/4]
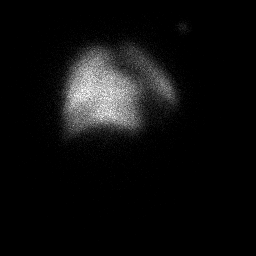
[im 2/4]
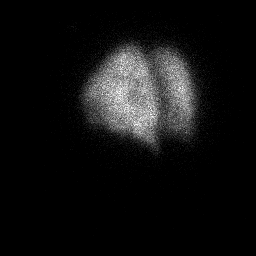
[im 3/4  full-range]
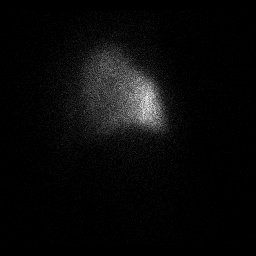
[im 3/4  full-range]
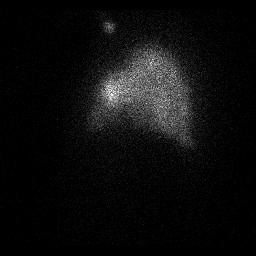
[im 4/4]
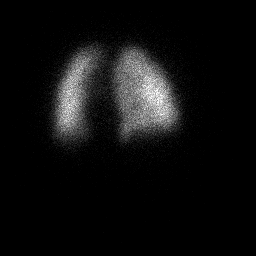
[im 4/4]
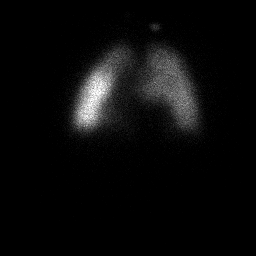

[8 of 8 positions shown; findings below may reference images not displayed]

FINDINGS: There is no significant perfusion defect identified.
IMPRESSION: Negative for pulmonary embolism

## 2020-10-11 IMAGING — DX DG CHEST 1V
1 series · 1 of 1 positions shown · non-contrast
Comparison: 06/02/2020

CLINICAL DATA: Respiratory distress, pulmonary embolism

EXAM:
CHEST  1 VIEW

[chest ap]
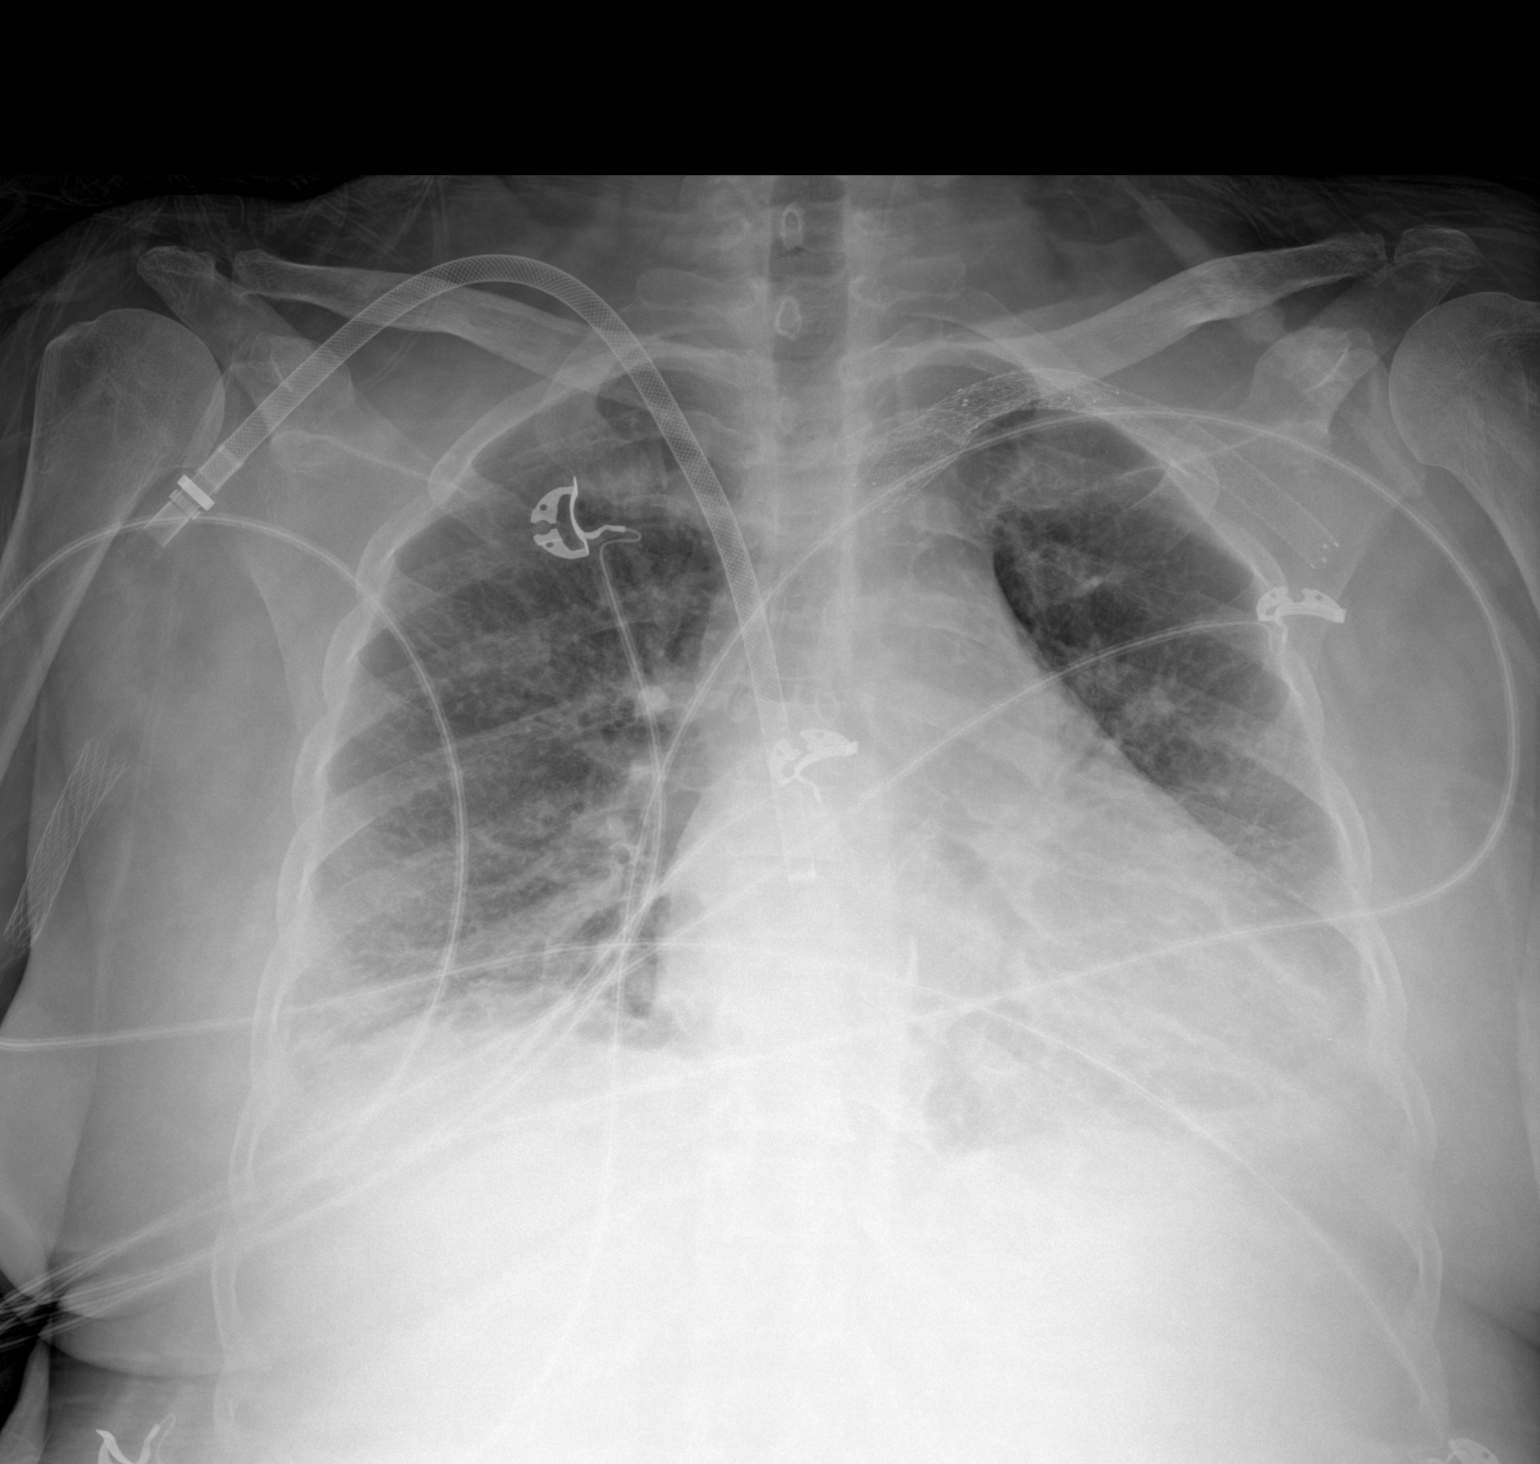

[1 of 1 positions shown; findings below may reference images not displayed]

FINDINGS: Lung volumes are small and there is bibasilar atelectasis.
Superimposed interstitial pulmonary edema has resolved. Tiny
bilateral pleural effusions have improved in the interval. No
pneumothorax. Cardiac size within normal limits. Right internal
jugular he row graft is seen with its tip overlying the right
atrium. Vascular stents are seen within the central left upper
extremity veins and visualized right upper extremity. Cardiac size
within normal limits.
IMPRESSION: Small lung volumes with bibasilar atelectasis. Superimposed
interstitial pulmonary edema has resolved. Tiny bilateral pleural
effusions have improved in the interval.

## 2020-10-11 IMAGING — CT CT HEAD W/O CM
3 series · 15 of 47 positions shown, 18 images · non-contrast
Comparison: Head CT 05/02/2020.  Brain MRI 05/03/2019.

CLINICAL DATA: 59-year-old female with hypotension, diaphoresis,
altered mental status today. In stage renal disease, dialysis.

EXAM:
CT HEAD WITHOUT CONTRAST
TECHNIQUE: Contiguous axial images were obtained from the base of the skull
through the vertex without intravenous contrast.

[Series 2: head wo · axial · 0.44mm/px · z∈[+472,+597]mm · 9 of 30 slices shown, 12 images]
[im 3/30  brain]
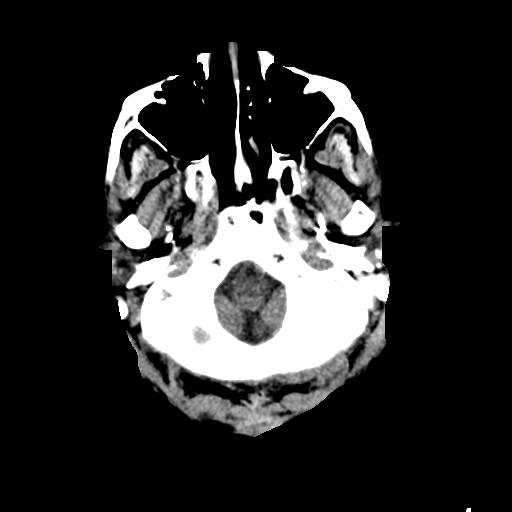
[im 3/30  bone]
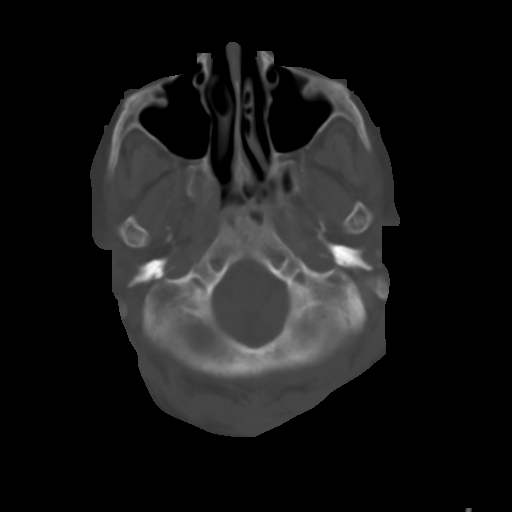
[im 6/30  brain]
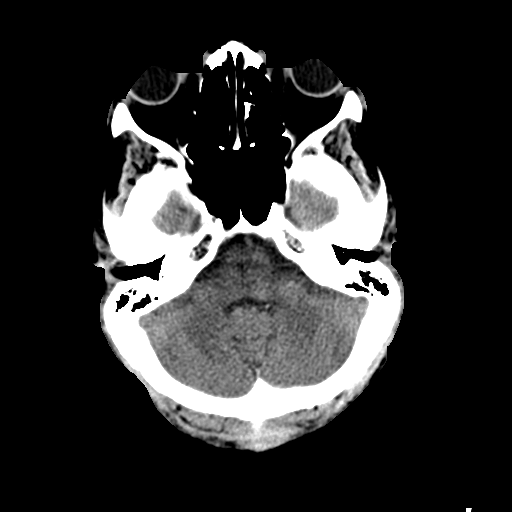
[im 9/30  brain]
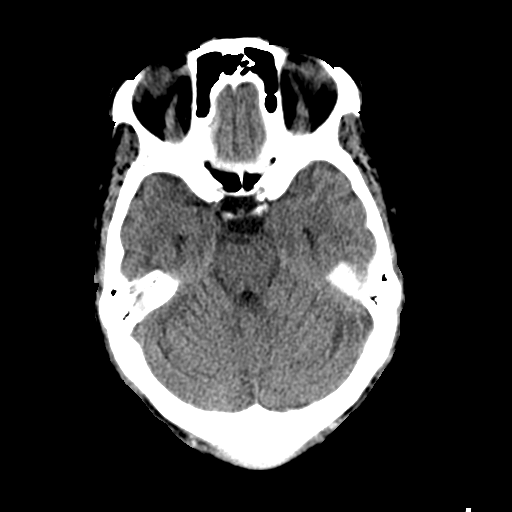
[im 12/30  brain]
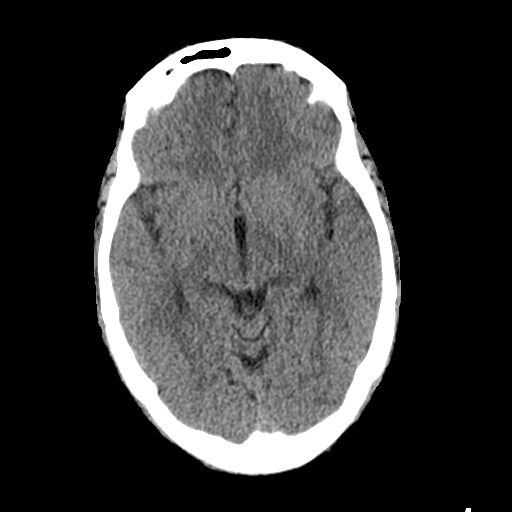
[im 16/30  brain]
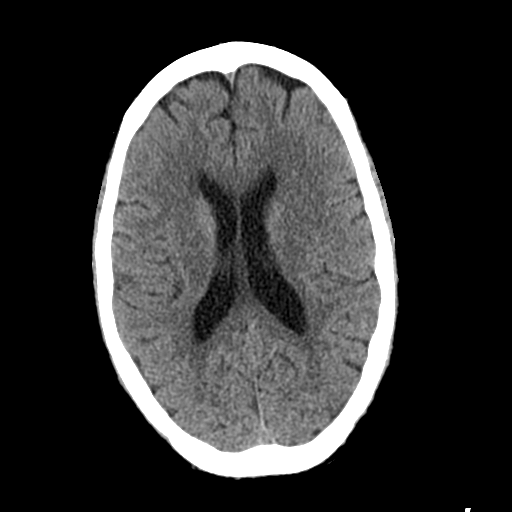
[im 16/30  bone]
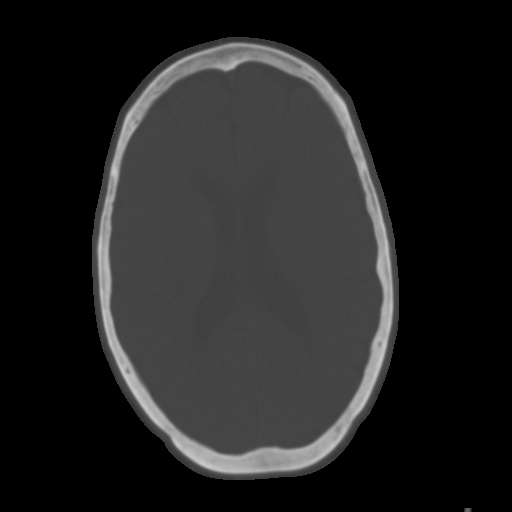
[im 19/30  brain]
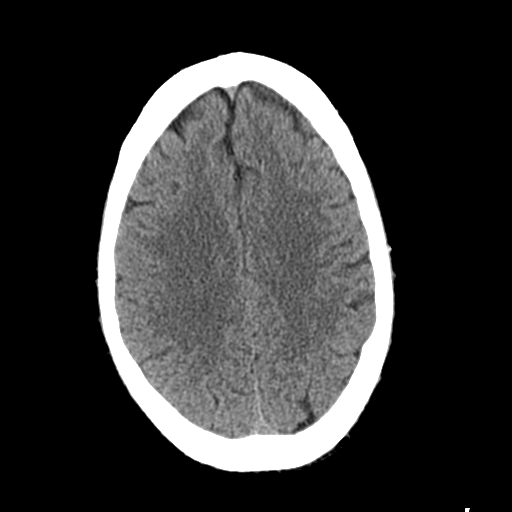
[im 22/30  brain]
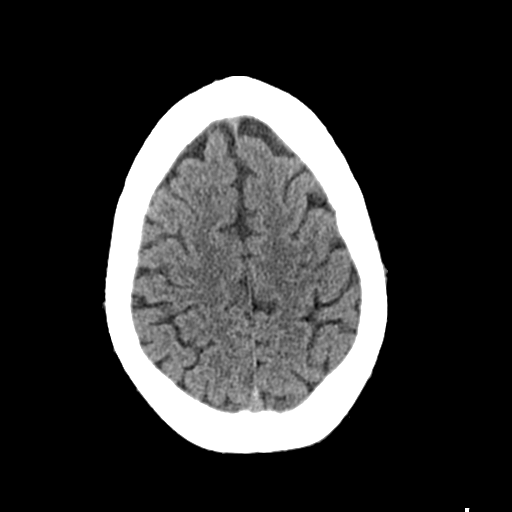
[im 25/30  brain]
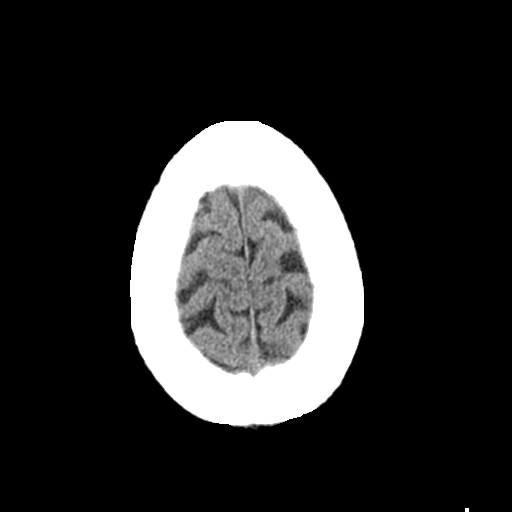
[im 28/30  brain]
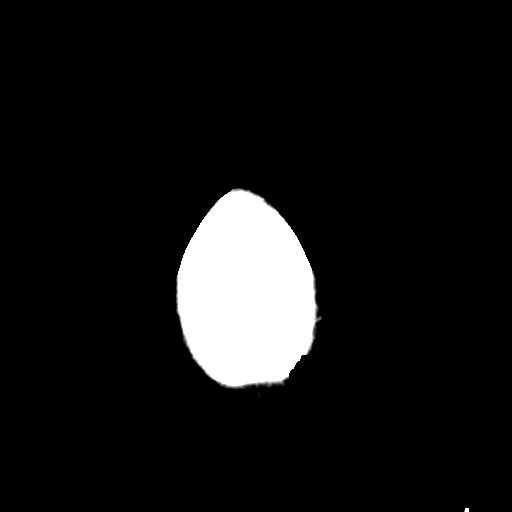
[im 28/30  bone]
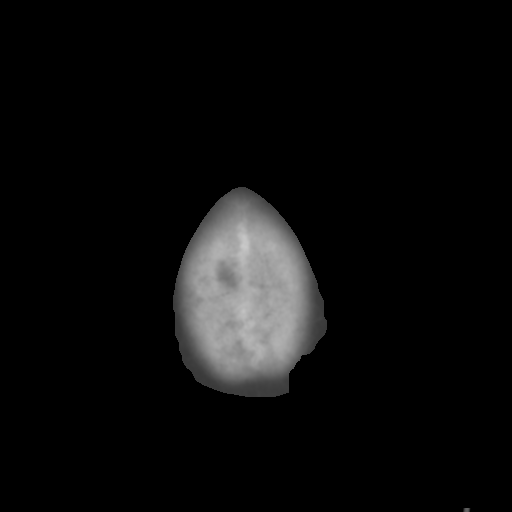

[Series 4: coronal soft tissue · coronal · 0.31mm/px · 3 of 64 slices shown]
[im 22/64  brain]
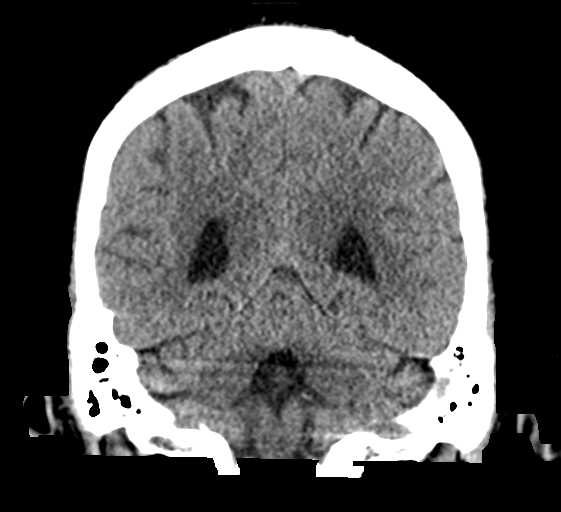
[im 29/64  brain]
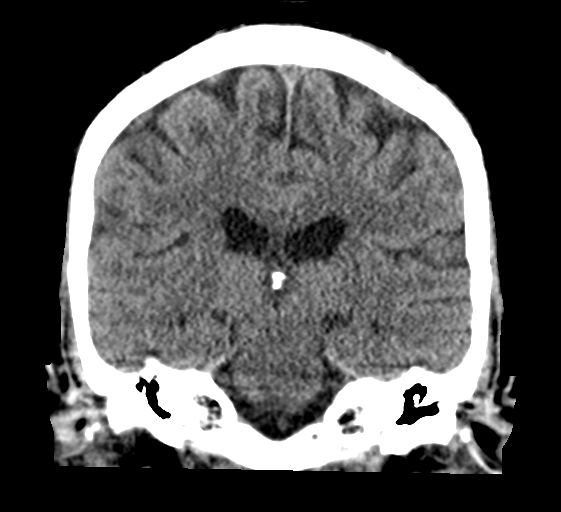
[im 36/64  brain]
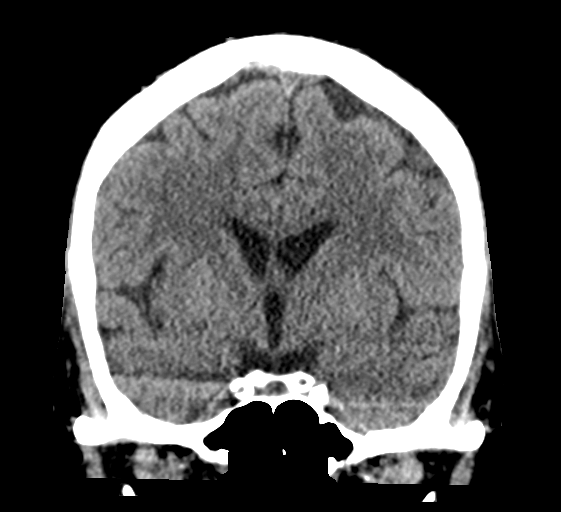

[Series 5: sagittal soft tissue · sagittal · 0.31mm/px · 3 of 51 slices shown]
[im 17/51  brain]
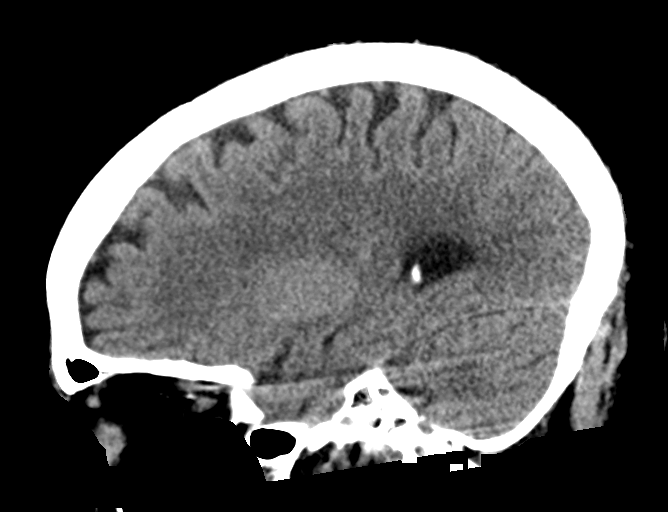
[im 26/51  brain]
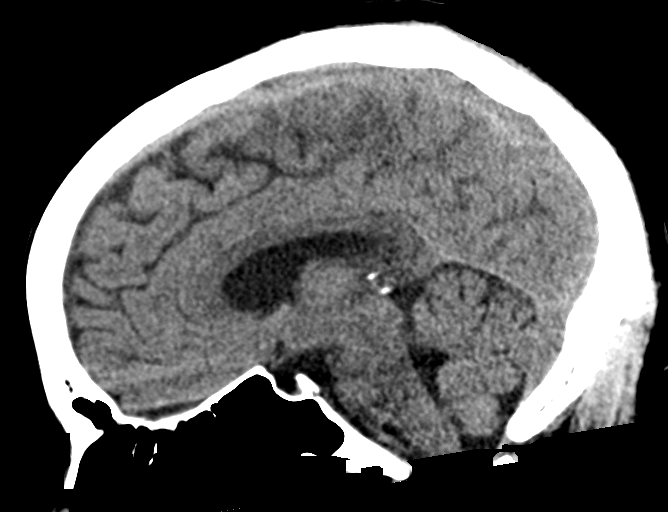
[im 34/51  brain]
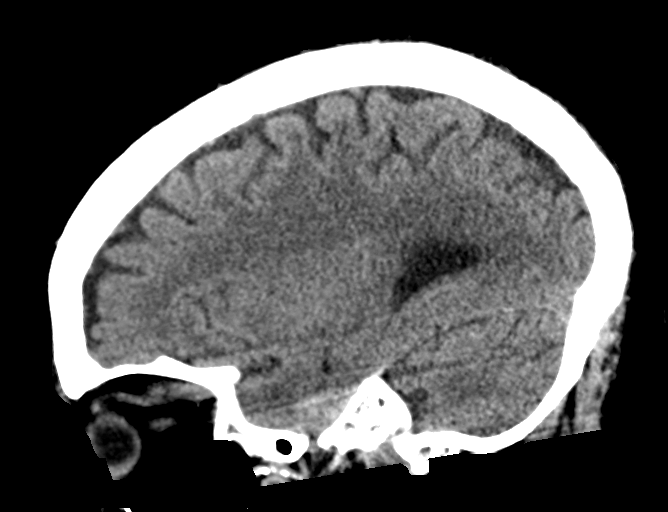

[15 of 47 positions shown; findings below may reference images not displayed]

FINDINGS: Brain: No midline shift, ventriculomegaly, mass effect, evidence of
mass lesion, intracranial hemorrhage or evidence of cortically based
acute infarction. Chronic lower pontine lacunar infarcts better
demonstrated on MRI last year. Elsewhere gray-white matter
differentiation is within normal limits throughout the brain.

Vascular: Calcified atherosclerosis at the skull base. No suspicious
intracranial vascular hyperdensity.

Skull: Stable visualized osseous structures. Scattered small lucent
areas in the calvarium (series 3, image 62 at the left vertex) have
mildly increased since 7428 and are probably related to metabolic
bone disease.

Sinuses/Orbits: Visualized paranasal sinuses and mastoids are stable
and well pneumatized.

Other: No acute orbit or scalp soft tissue finding. Some scalp
vessel calcified atherosclerosis is noted.
IMPRESSION: 1. No acute intracranial abnormality.
2. Chronic lacunar infarcts in the pons.
3. Several slowly enlarging small lucent areas in the calvarium
since 7428 are probably related to metabolic bone disease in the
setting of chronic ESRD.

## 2020-10-12 ENCOUNTER — Other Ambulatory Visit: Payer: Medicare HMO

## 2020-10-12 DIAGNOSIS — N186 End stage renal disease: Secondary | ICD-10-CM | POA: Diagnosis not present

## 2020-10-12 DIAGNOSIS — G0491 Myelitis, unspecified: Secondary | ICD-10-CM | POA: Diagnosis not present

## 2020-10-12 DIAGNOSIS — Z992 Dependence on renal dialysis: Secondary | ICD-10-CM | POA: Diagnosis not present

## 2020-10-15 DIAGNOSIS — F331 Major depressive disorder, recurrent, moderate: Secondary | ICD-10-CM | POA: Diagnosis not present

## 2020-10-15 DIAGNOSIS — E109 Type 1 diabetes mellitus without complications: Secondary | ICD-10-CM | POA: Diagnosis not present

## 2020-10-15 IMAGING — US US EXTREM UP DUPLEX ARTERIAL*R* LIMITED
1 series · 14 of 19 positions shown · non-contrast
Comparison: None.

CLINICAL DATA: 59-year-old female with new swelling at the dialysis
access.

EXAM:
RIGHT UPPER EXTREMITY ARTERIAL DUPLEX SCAN
TECHNIQUE: Gray-scale sonography as well as color Doppler and duplex ultrasound
was performed to evaluate a limited evaluation of the upper
extremity.

[Series 1: us upper extremity arterial right limited (graft,s · arterial · 19 acquisitions, 14 frames shown]
[im 1/19]
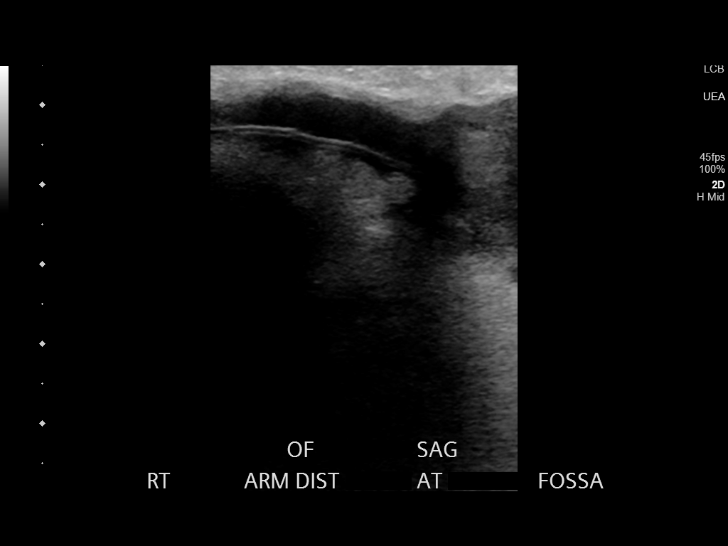
[im 3/19]
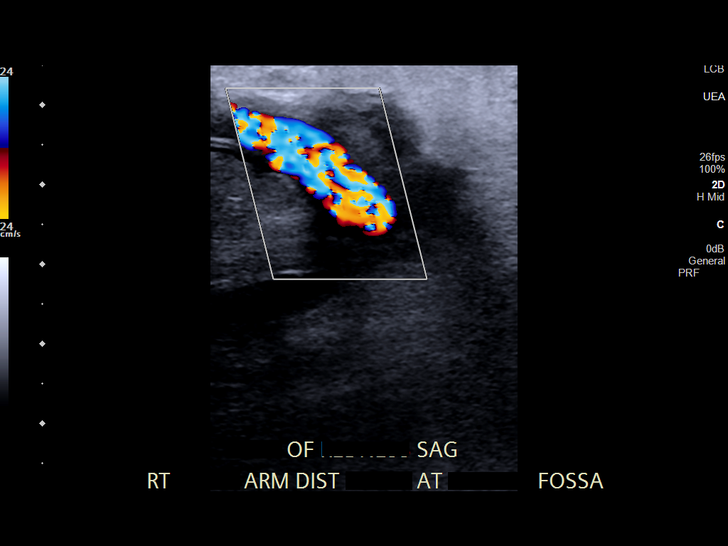
[im 4/19]
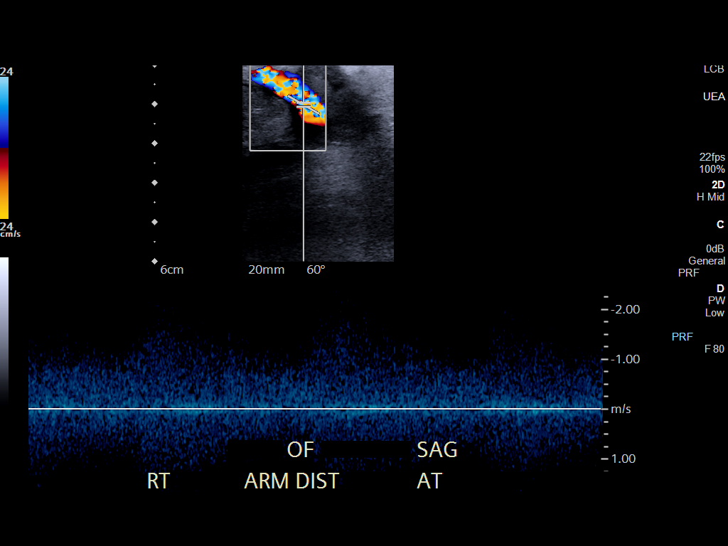
[im 5/19]
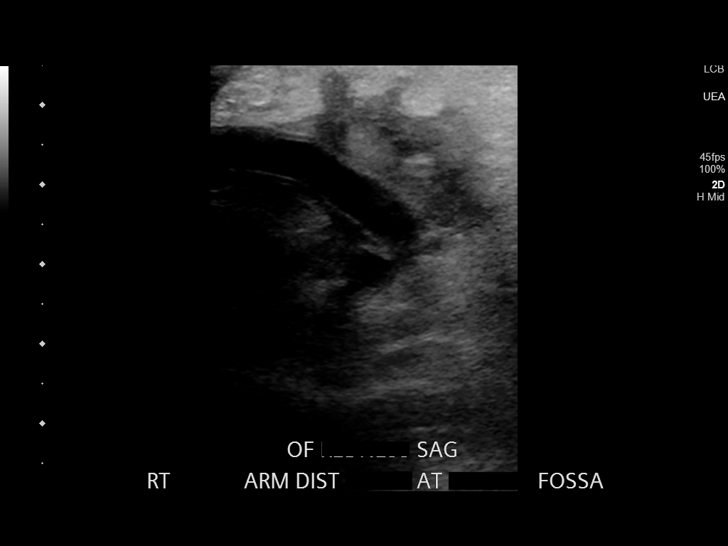
[im 7/19]
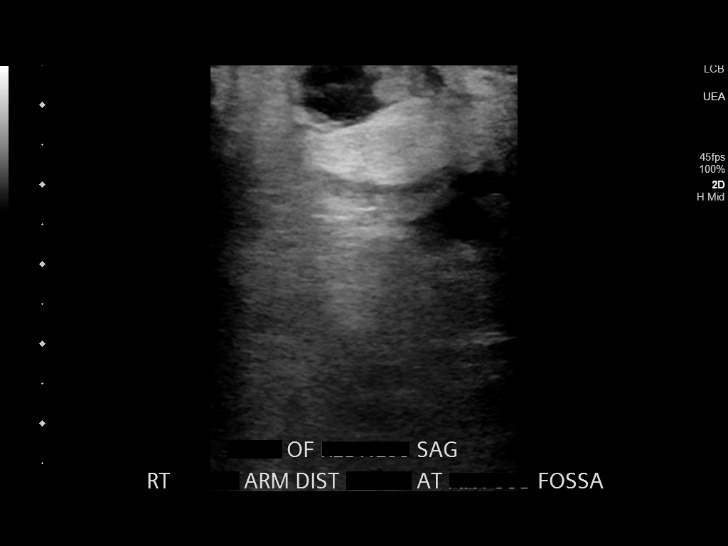
[im 8/19]
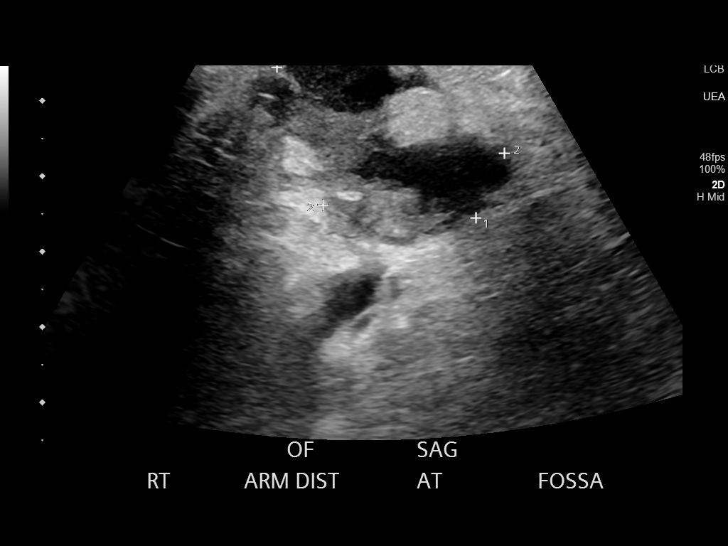
[im 9/19]
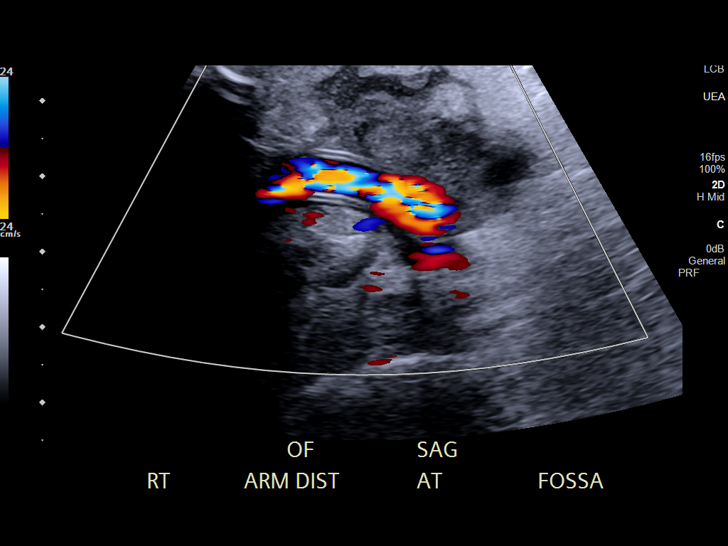
[im 11/19]
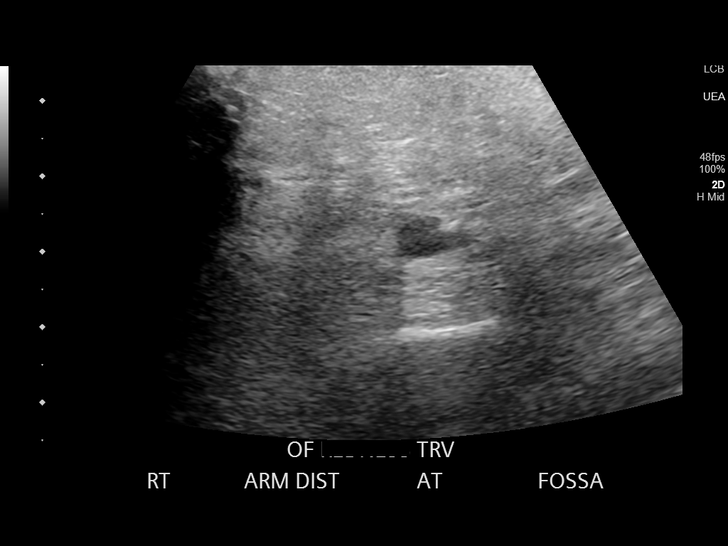
[im 12/19]
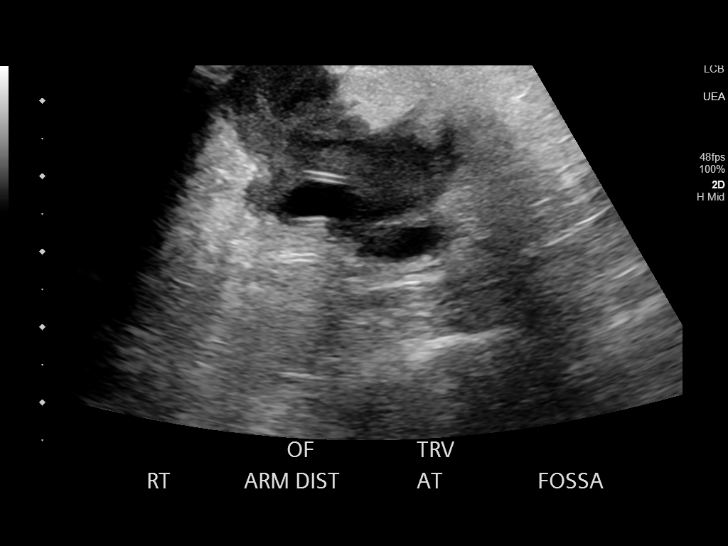
[im 13/19]
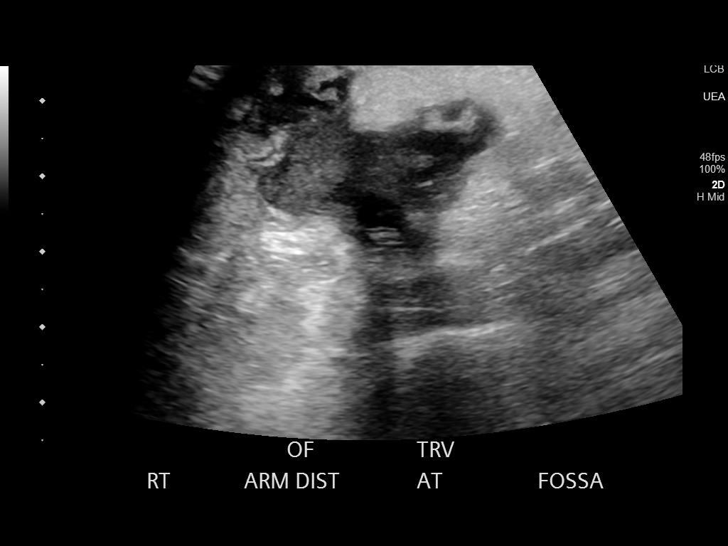
[im 15/19]
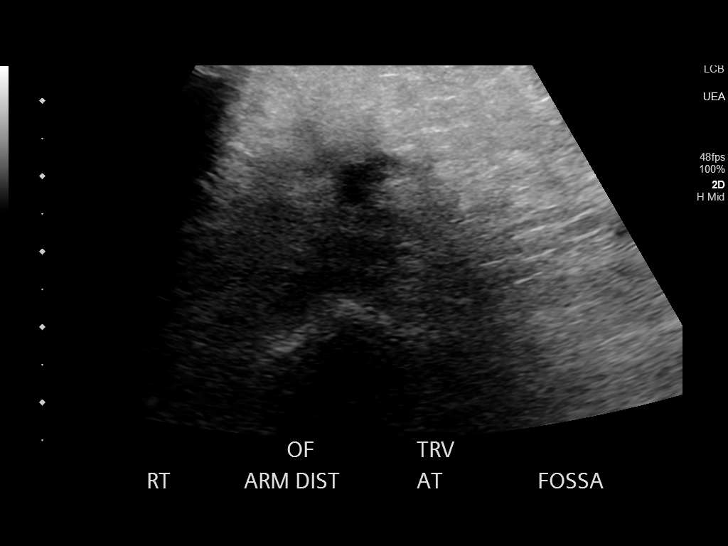
[im 16/19]
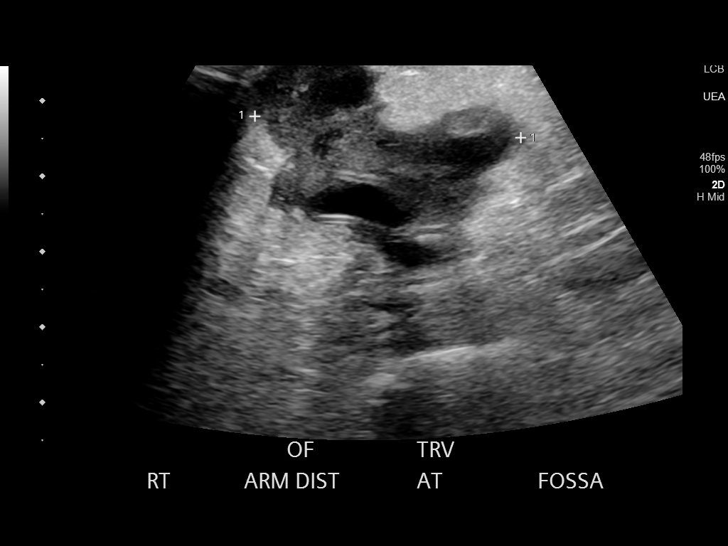
[im 17/19]
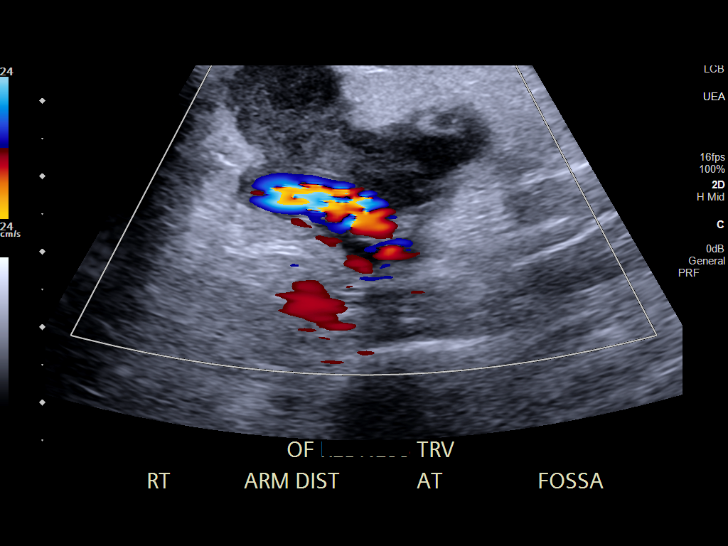
[im 19/19]
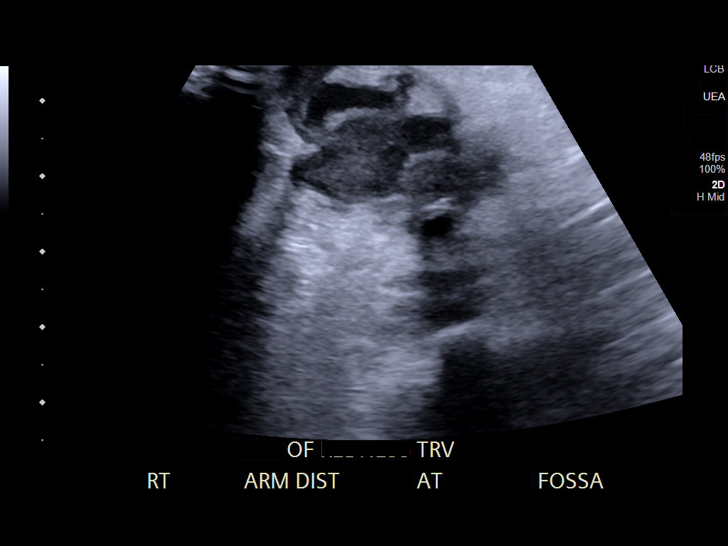

[14 of 19 positions shown; findings below may reference images not displayed]

FINDINGS: Duplex performed in the region clinical concern. Flow confirmed
within the dialysis circuit. No volume measurement or velocities
were measured.

Heterogeneous fluid present adjacent to the graft within the soft
tissues. No internal color flow to suggest a patent pseudoaneurysm.

Estimated dimension of the fluid 3.3 cm x 2.5 cm x 3.5 cm.
IMPRESSION: Heterogeneous fluid collection adjacent to patent right upper
extremity dialysis graft, with the differential including blood
products/hematoma versus abscess given the history.

## 2020-10-16 DIAGNOSIS — Z992 Dependence on renal dialysis: Secondary | ICD-10-CM | POA: Diagnosis not present

## 2020-10-16 DIAGNOSIS — N186 End stage renal disease: Secondary | ICD-10-CM

## 2020-10-16 DIAGNOSIS — Z79899 Other long term (current) drug therapy: Secondary | ICD-10-CM | POA: Diagnosis not present

## 2020-10-16 DIAGNOSIS — G0491 Myelitis, unspecified: Secondary | ICD-10-CM | POA: Diagnosis not present

## 2020-10-17 ENCOUNTER — Other Ambulatory Visit (INDEPENDENT_AMBULATORY_CARE_PROVIDER_SITE_OTHER): Payer: Self-pay | Admitting: Nurse Practitioner

## 2020-10-17 DIAGNOSIS — I132 Hypertensive heart and chronic kidney disease with heart failure and with stage 5 chronic kidney disease, or end stage renal disease: Secondary | ICD-10-CM | POA: Diagnosis not present

## 2020-10-17 DIAGNOSIS — D631 Anemia in chronic kidney disease: Secondary | ICD-10-CM | POA: Diagnosis not present

## 2020-10-17 DIAGNOSIS — N186 End stage renal disease: Secondary | ICD-10-CM

## 2020-10-17 DIAGNOSIS — J449 Chronic obstructive pulmonary disease, unspecified: Secondary | ICD-10-CM | POA: Diagnosis not present

## 2020-10-17 DIAGNOSIS — I2510 Atherosclerotic heart disease of native coronary artery without angina pectoris: Secondary | ICD-10-CM | POA: Diagnosis not present

## 2020-10-17 DIAGNOSIS — M4624 Osteomyelitis of vertebra, thoracic region: Secondary | ICD-10-CM | POA: Diagnosis not present

## 2020-10-17 DIAGNOSIS — I823 Embolism and thrombosis of renal vein: Secondary | ICD-10-CM | POA: Diagnosis not present

## 2020-10-17 DIAGNOSIS — I5030 Unspecified diastolic (congestive) heart failure: Secondary | ICD-10-CM | POA: Diagnosis not present

## 2020-10-17 DIAGNOSIS — F32A Depression, unspecified: Secondary | ICD-10-CM | POA: Diagnosis not present

## 2020-10-17 IMAGING — CT CT HEAD W/O CM
3 series · 15 of 47 positions shown, 18 images · non-contrast
Comparison: Six days ago

CLINICAL DATA: Hypotension during PE.  History of dialysis

EXAM:
CT HEAD WITHOUT CONTRAST
TECHNIQUE: Contiguous axial images were obtained from the base of the skull
through the vertex without intravenous contrast.

[Series 2: head wo · axial · 0.40mm/px · z∈[+704,+829]mm · 9 of 31 slices shown, 12 images]
[im 3/31  brain]
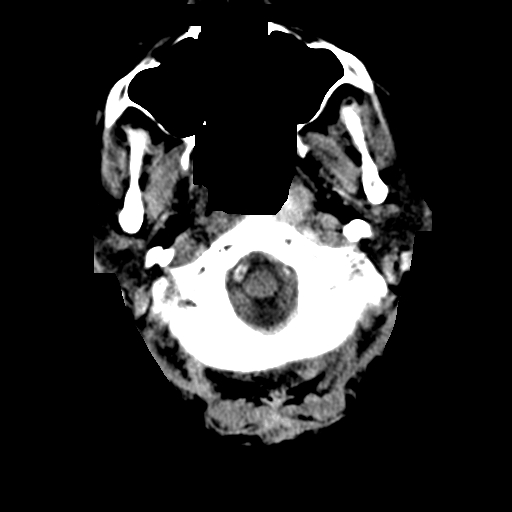
[im 3/31  bone]
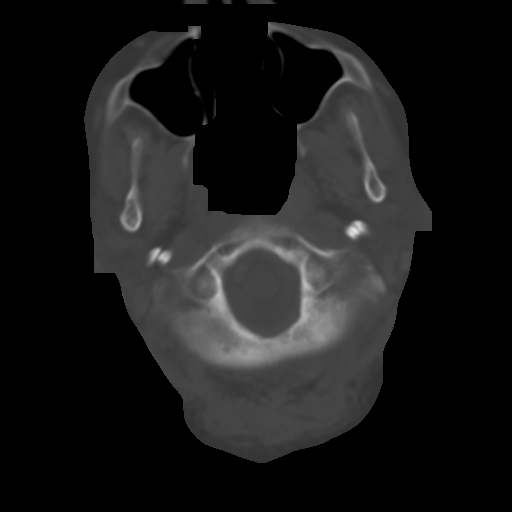
[im 6/31  brain]
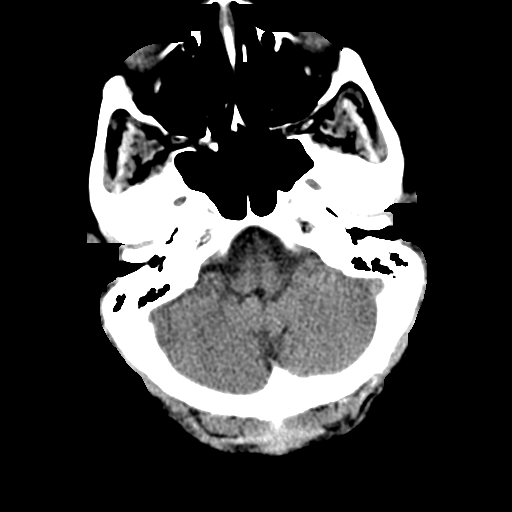
[im 9/31  brain]
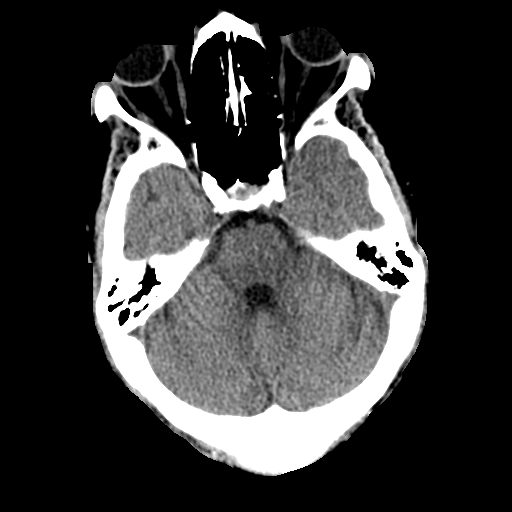
[im 12/31  brain]
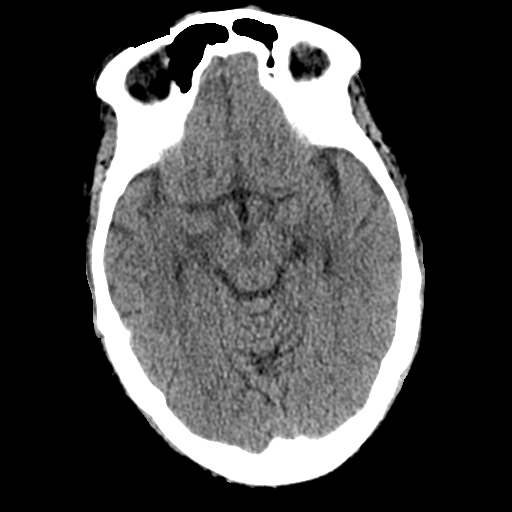
[im 16/31  brain]
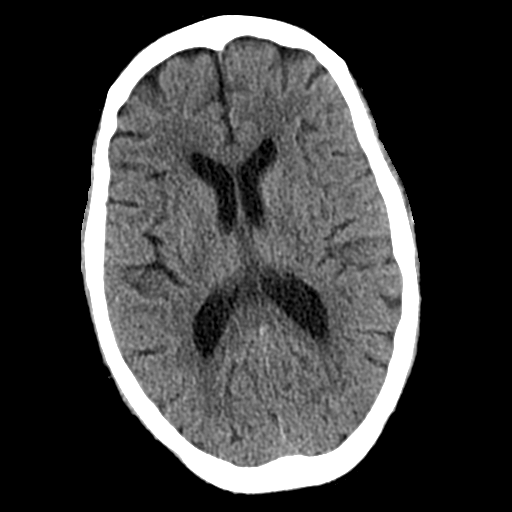
[im 16/31  bone]
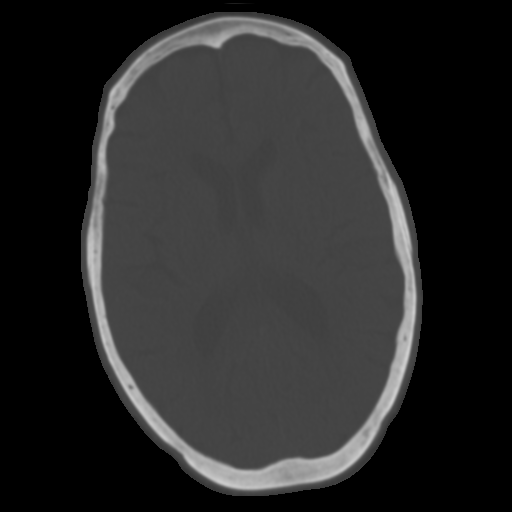
[im 19/31  brain]
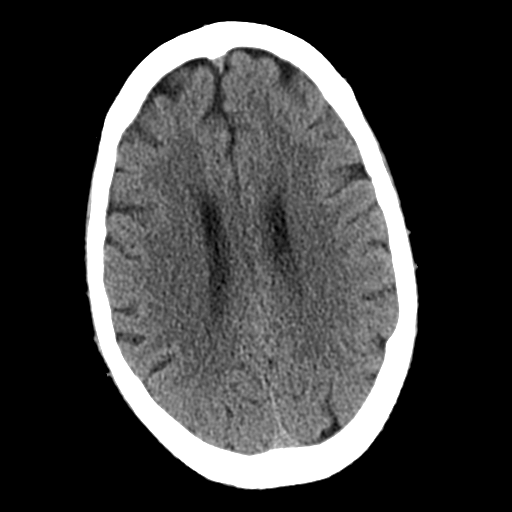
[im 22/31  brain]
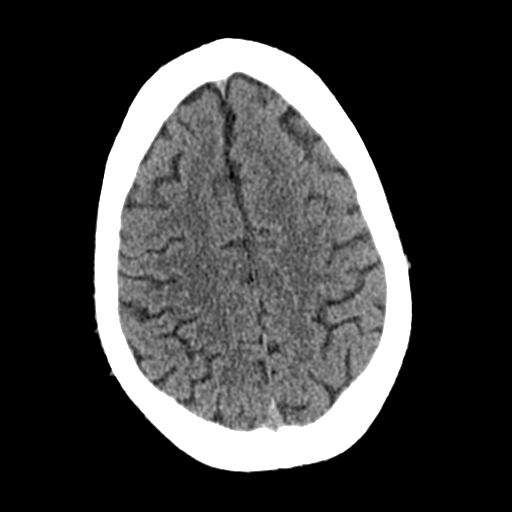
[im 25/31  brain]
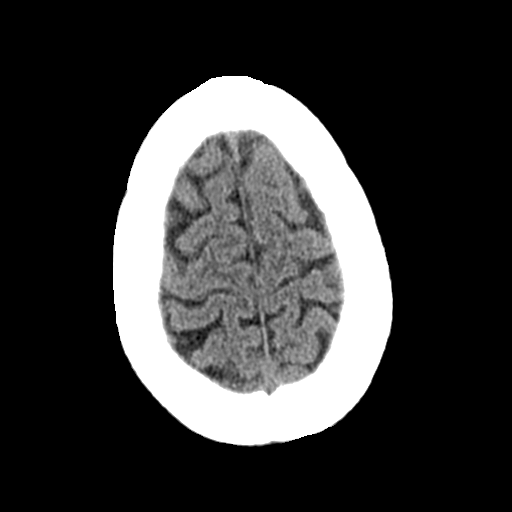
[im 28/31  brain]
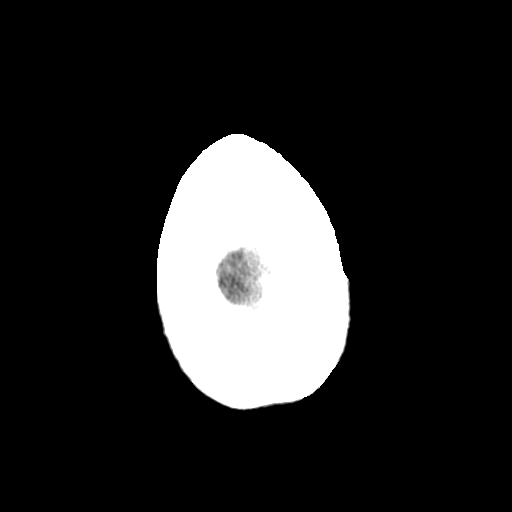
[im 28/31  bone]
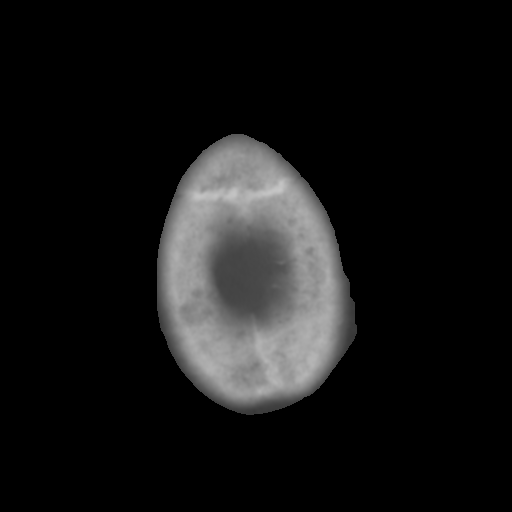

[Series 4: coronal soft tissue · coronal · 0.32mm/px · 3 of 71 slices shown]
[im 24/71  brain]
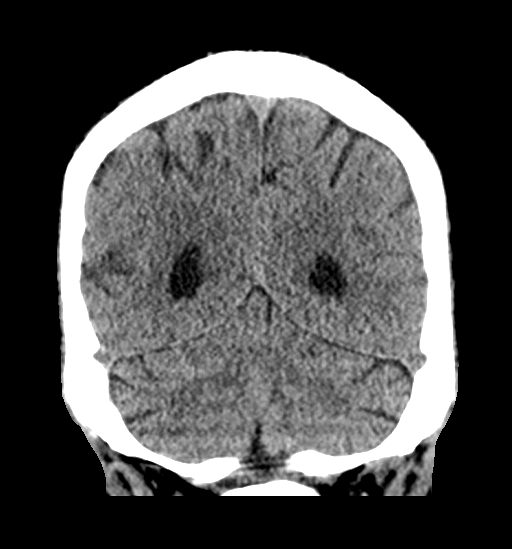
[im 32/71  brain]
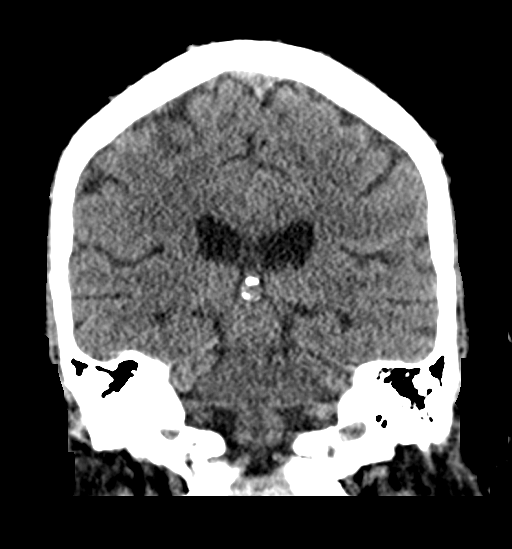
[im 39/71  brain]
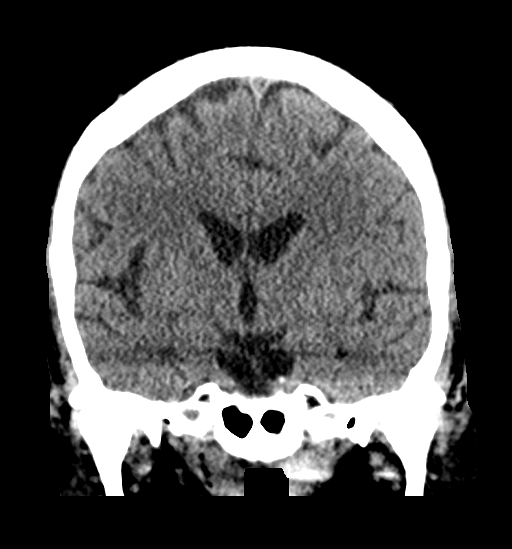

[Series 5: sagittal soft tissue · sagittal · 0.32mm/px · 3 of 50 slices shown]
[im 17/50  brain]
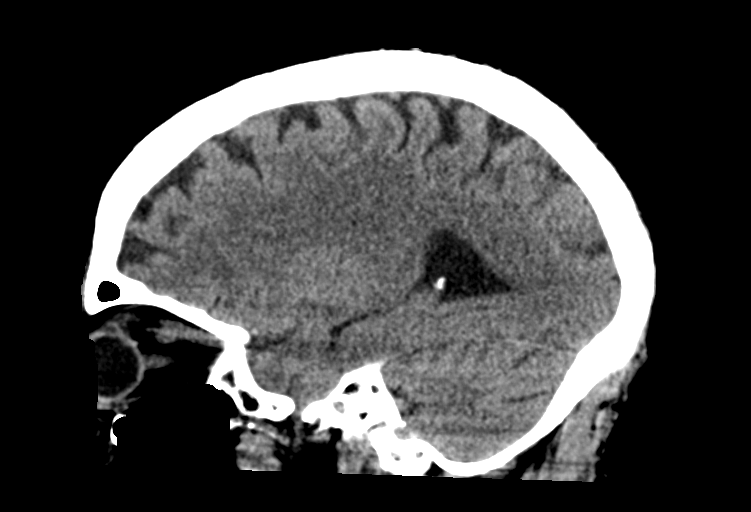
[im 25/50  brain]
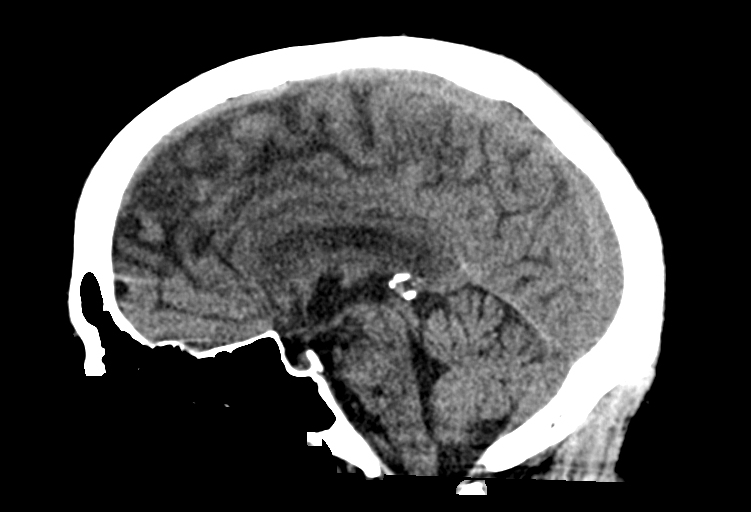
[im 33/50  brain]
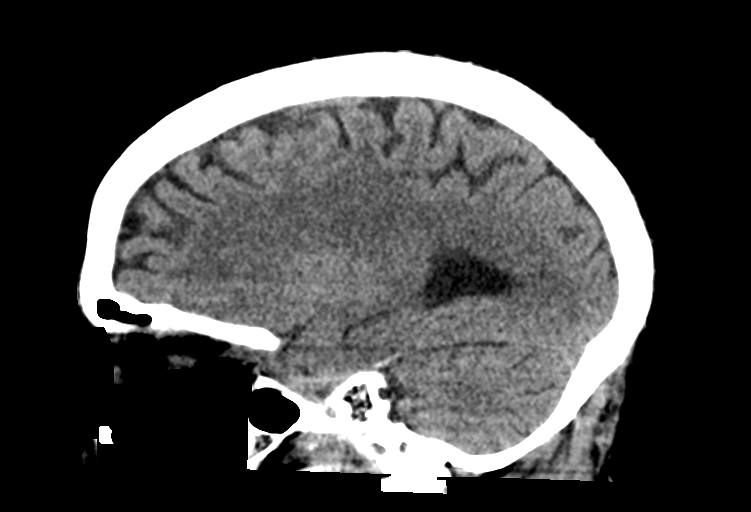

[15 of 47 positions shown; findings below may reference images not displayed]

FINDINGS: Brain: No evidence of acute infarction, hemorrhage, hydrocephalus,
extra-axial collection or mass lesion/mass effect.

Vascular: Atherosclerotic calcification

Skull: Generalized sclerotic appearance of the bones, likely renal.

Sinuses/Orbits: Bilateral cataract resection.  No acute finding
IMPRESSION: No acute finding.

## 2020-10-18 DIAGNOSIS — Z992 Dependence on renal dialysis: Secondary | ICD-10-CM | POA: Diagnosis not present

## 2020-10-18 DIAGNOSIS — N186 End stage renal disease: Secondary | ICD-10-CM | POA: Diagnosis not present

## 2020-10-18 DIAGNOSIS — G0491 Myelitis, unspecified: Secondary | ICD-10-CM | POA: Diagnosis not present

## 2020-10-18 IMAGING — MR MR HEAD W/O CM
11 series · 41 of 48 positions shown · non-contrast
Comparison: Prior head CT from 06/11/2020.

CLINICAL DATA: Follow-up examination for stroke.

EXAM:
MRI HEAD WITHOUT CONTRAST
TECHNIQUE: Multiplanar, multiecho pulse sequences of the brain and surrounding
structures were obtained without intravenous contrast.

[Series 5: ax dwi_tracew · axial · 3.0mm · 0.60mm/px · z∈[-71,+81]mm · 4 of 48 slices shown]
[im 1/48]
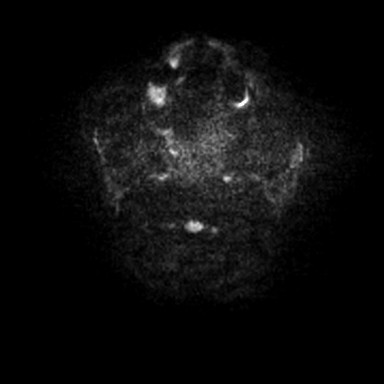
[im 16/48]
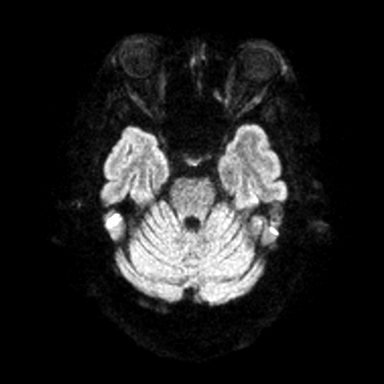
[im 32/48]
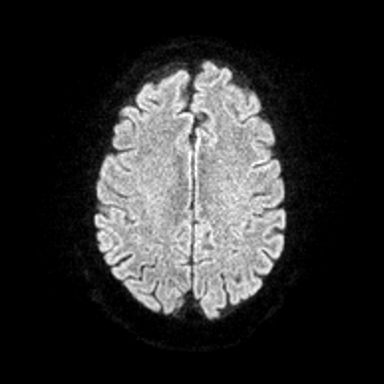
[im 48/48]
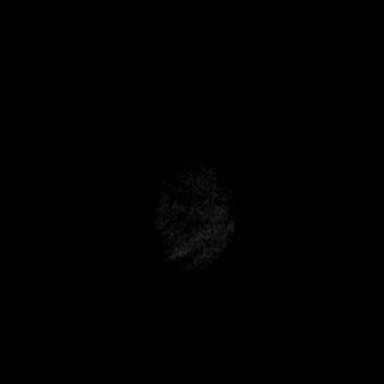

[Series 6: ax dwi_adc · axial · 3.0mm · 0.60mm/px · z∈[-71,+81]mm · 4 of 48 slices shown]
[im 1/48]
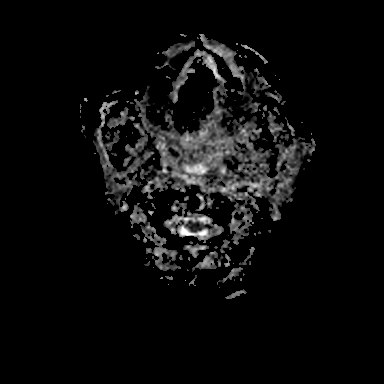
[im 16/48]
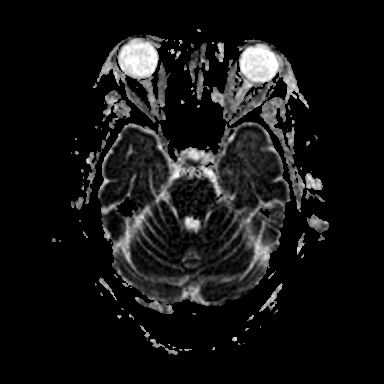
[im 32/48]
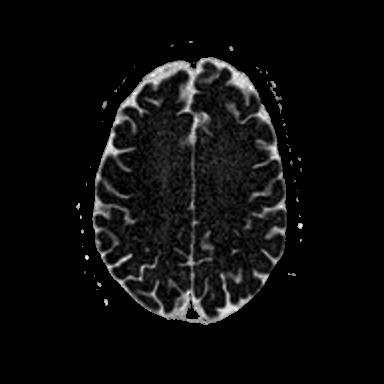
[im 48/48]
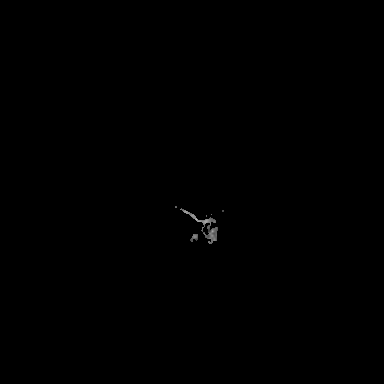

[Series 7: cor dwi_tracew · coronal · 5.0mm · 0.60mm/px · 3 of 40 slices shown]
[im 1/40]
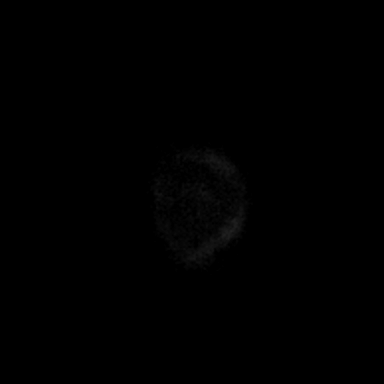
[im 20/40]
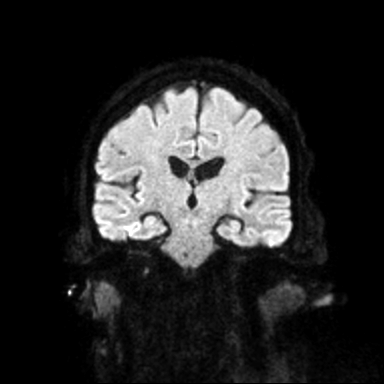
[im 40/40]
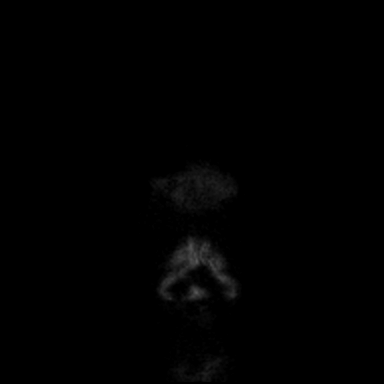

[Series 8: cor dwi_adc · coronal · 5.0mm · 0.60mm/px · 3 of 39 slices shown]
[im 1/39]
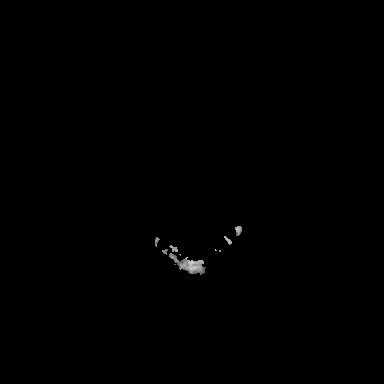
[im 20/39]
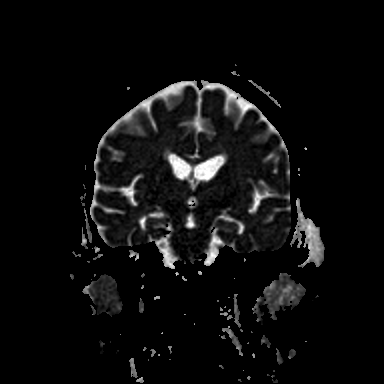
[im 39/39]
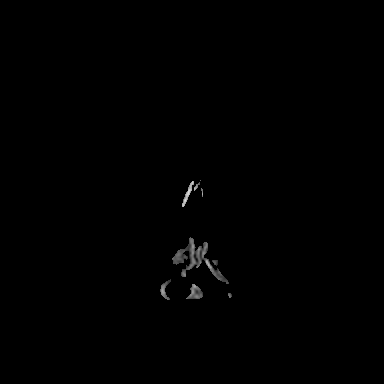

[Series 9: T1 · sagittal · 5.0mm · 0.62mm/px · 2 of 21 slices shown (1 of 2)]
[im 1/21]
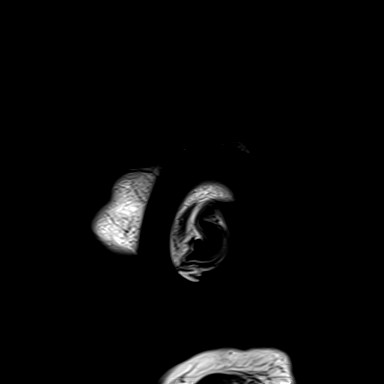
[im 21/21]
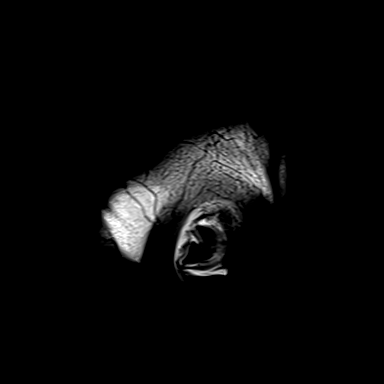

[Series 10: T2 · axial · 5.0mm · 0.53mm/px · z∈[-72,+81]mm · 2 of 27 slices shown (1 of 2)]
[im 1/27]
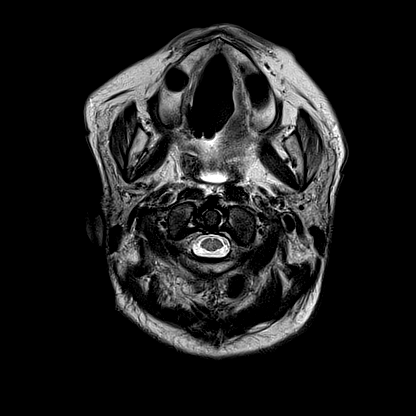
[im 27/27]
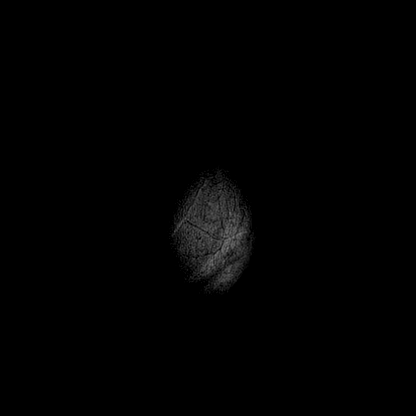

[Series 12: pha_images · axial · 3.0mm · 0.90mm/px · z∈[-82,+88]mm · 5 of 58 slices shown]
[im 1/58]
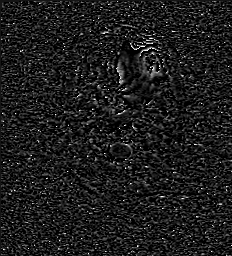
[im 15/58]
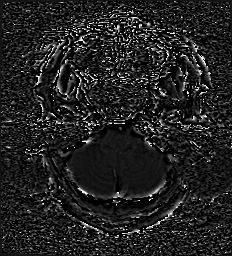
[im 29/58]
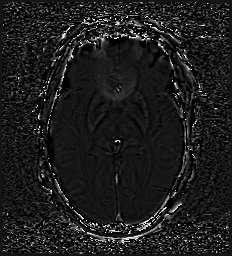
[im 43/58]
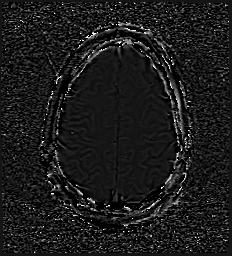
[im 58/58]
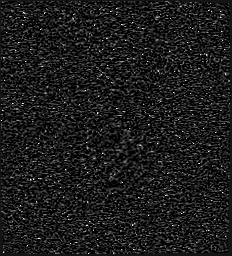

[Series 13: swi_images · axial · 3.0mm · 0.90mm/px · z∈[-82,+47]mm · 4 of 60 slices shown]
[im 1/60]
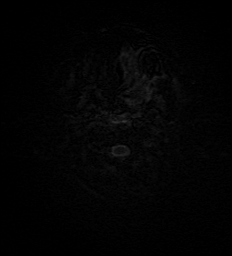
[im 15/60]
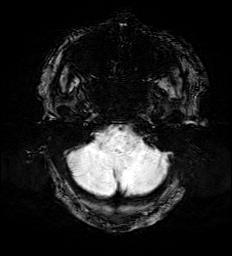
[im 30/60]
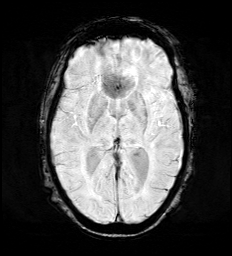
[im 45/60]
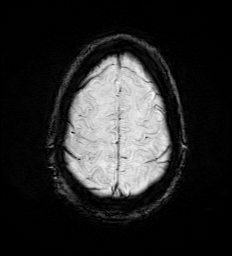

[Series 15: FLAIR · axial · 3.0mm · 0.53mm/px · z∈[-75,+84]mm · 4 of 55 slices shown]
[im 1/55]
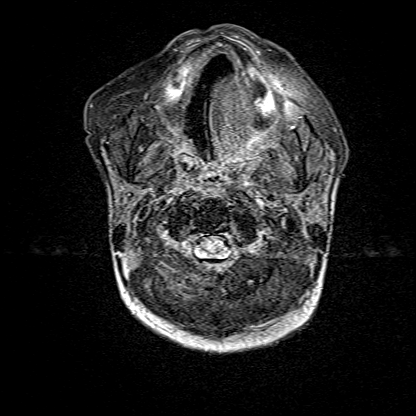
[im 19/55]
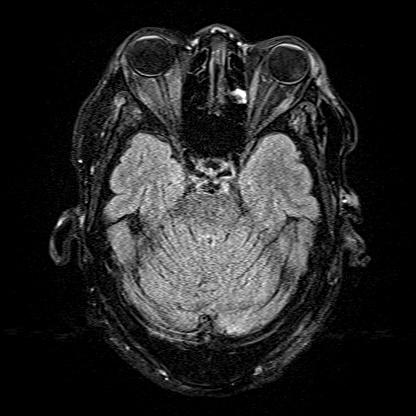
[im 37/55]
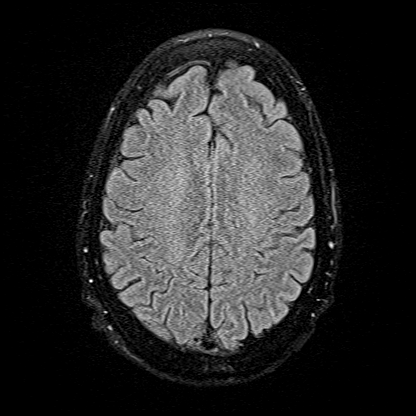
[im 55/55]
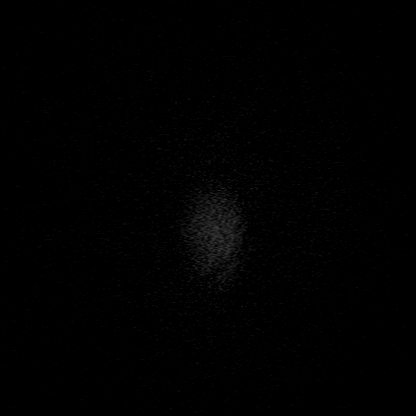

[Series 16: T1 · axial · 1.0mm · 0.98mm/px · z∈[-79,+93]mm · 8 of 176 slices shown (2 of 2)]
[im 1/176]
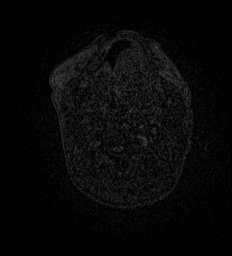
[im 27/176]
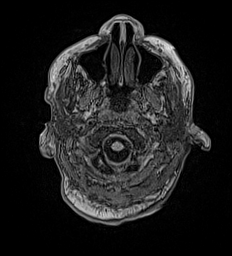
[im 54/176]
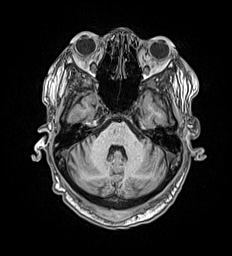
[im 81/176]
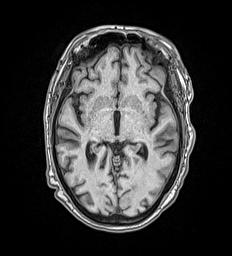
[im 95/176]
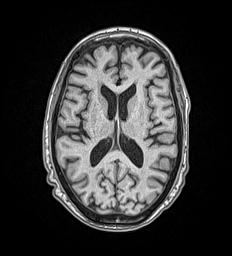
[im 122/176]
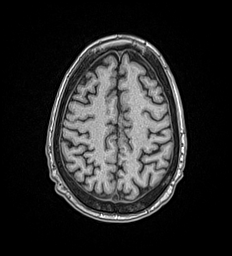
[im 149/176]
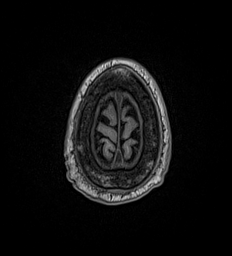
[im 176/176]
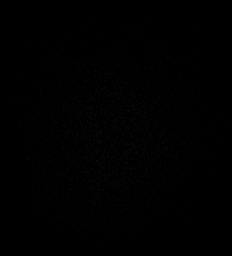

[Series 17: T2 · coronal · 5.0mm · 0.57mm/px · 2 of 31 slices shown (2 of 2)]
[im 1/31]
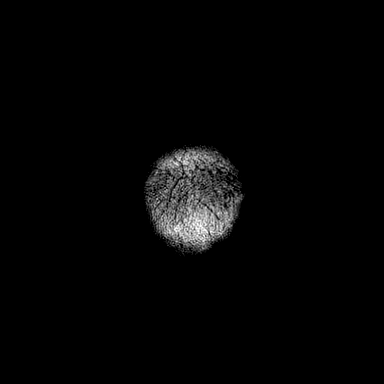
[im 31/31]
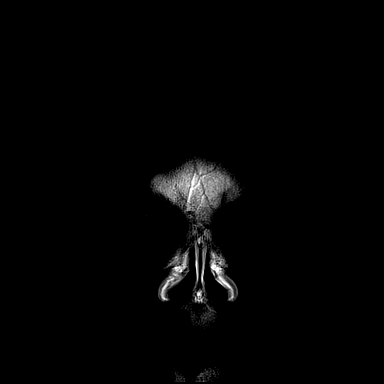

[41 of 48 positions shown; findings below may reference images not displayed]

FINDINGS: Brain: Cerebral volume within normal limits for age. Mild hazy
T2/FLAIR hyperintensity noted within the periventricular white
matter, nonspecific, but most like related chronic microvascular
ischemic disease, felt to be within normal limits for age. Few small
remote lacunar infarcts noted within the pons.

No abnormal foci of restricted diffusion to suggest acute or
subacute ischemia. Gray-white matter differentiation maintained. No
encephalomalacia to suggest chronic cortical infarction. Single
punctate chronic microhemorrhage noted within the left cerebellum,
of doubtful significance in isolation. No other foci of
susceptibility artifact to suggest acute or chronic intracranial
hemorrhage.

No mass lesion, midline shift or mass effect. No hydrocephalus or
extra-axial fluid collection. Incidental note made of a partially
empty sella. Midline structures intact.

Vascular: Major intracranial vascular flow voids are maintained.

Skull and upper cervical spine: Craniocervical junction within
normal limits. Diffusely decreased T1 signal intensity seen within
the visualized bone marrow, nonspecific, but most commonly related
to anemia, smoking, or obesity. No focal marrow replacing lesion. No
scalp soft tissue abnormality.

Sinuses/Orbits: Patient status post bilateral ocular lens
replacement. Globes and orbital soft tissues demonstrate no acute
finding. Mild mucosal thickening noted within the left ethmoidal air
cells. Paranasal sinuses are otherwise largely clear. Fluid noted
layering within the nasopharynx. Patient is intubated. No
significant mastoid effusion.

Other: None.
IMPRESSION: 1. No acute intracranial abnormality.
2. Few small remote lacunar infarcts involving the pons.
3. Otherwise normal brain MRI for age.

## 2020-10-18 IMAGING — DX DG CHEST 1V PORT
1 series · 1 of 1 positions shown · non-contrast
Comparison: None.

CLINICAL DATA: Acute respiratory failure

EXAM:
PORTABLE CHEST 1 VIEW

[chest ap]
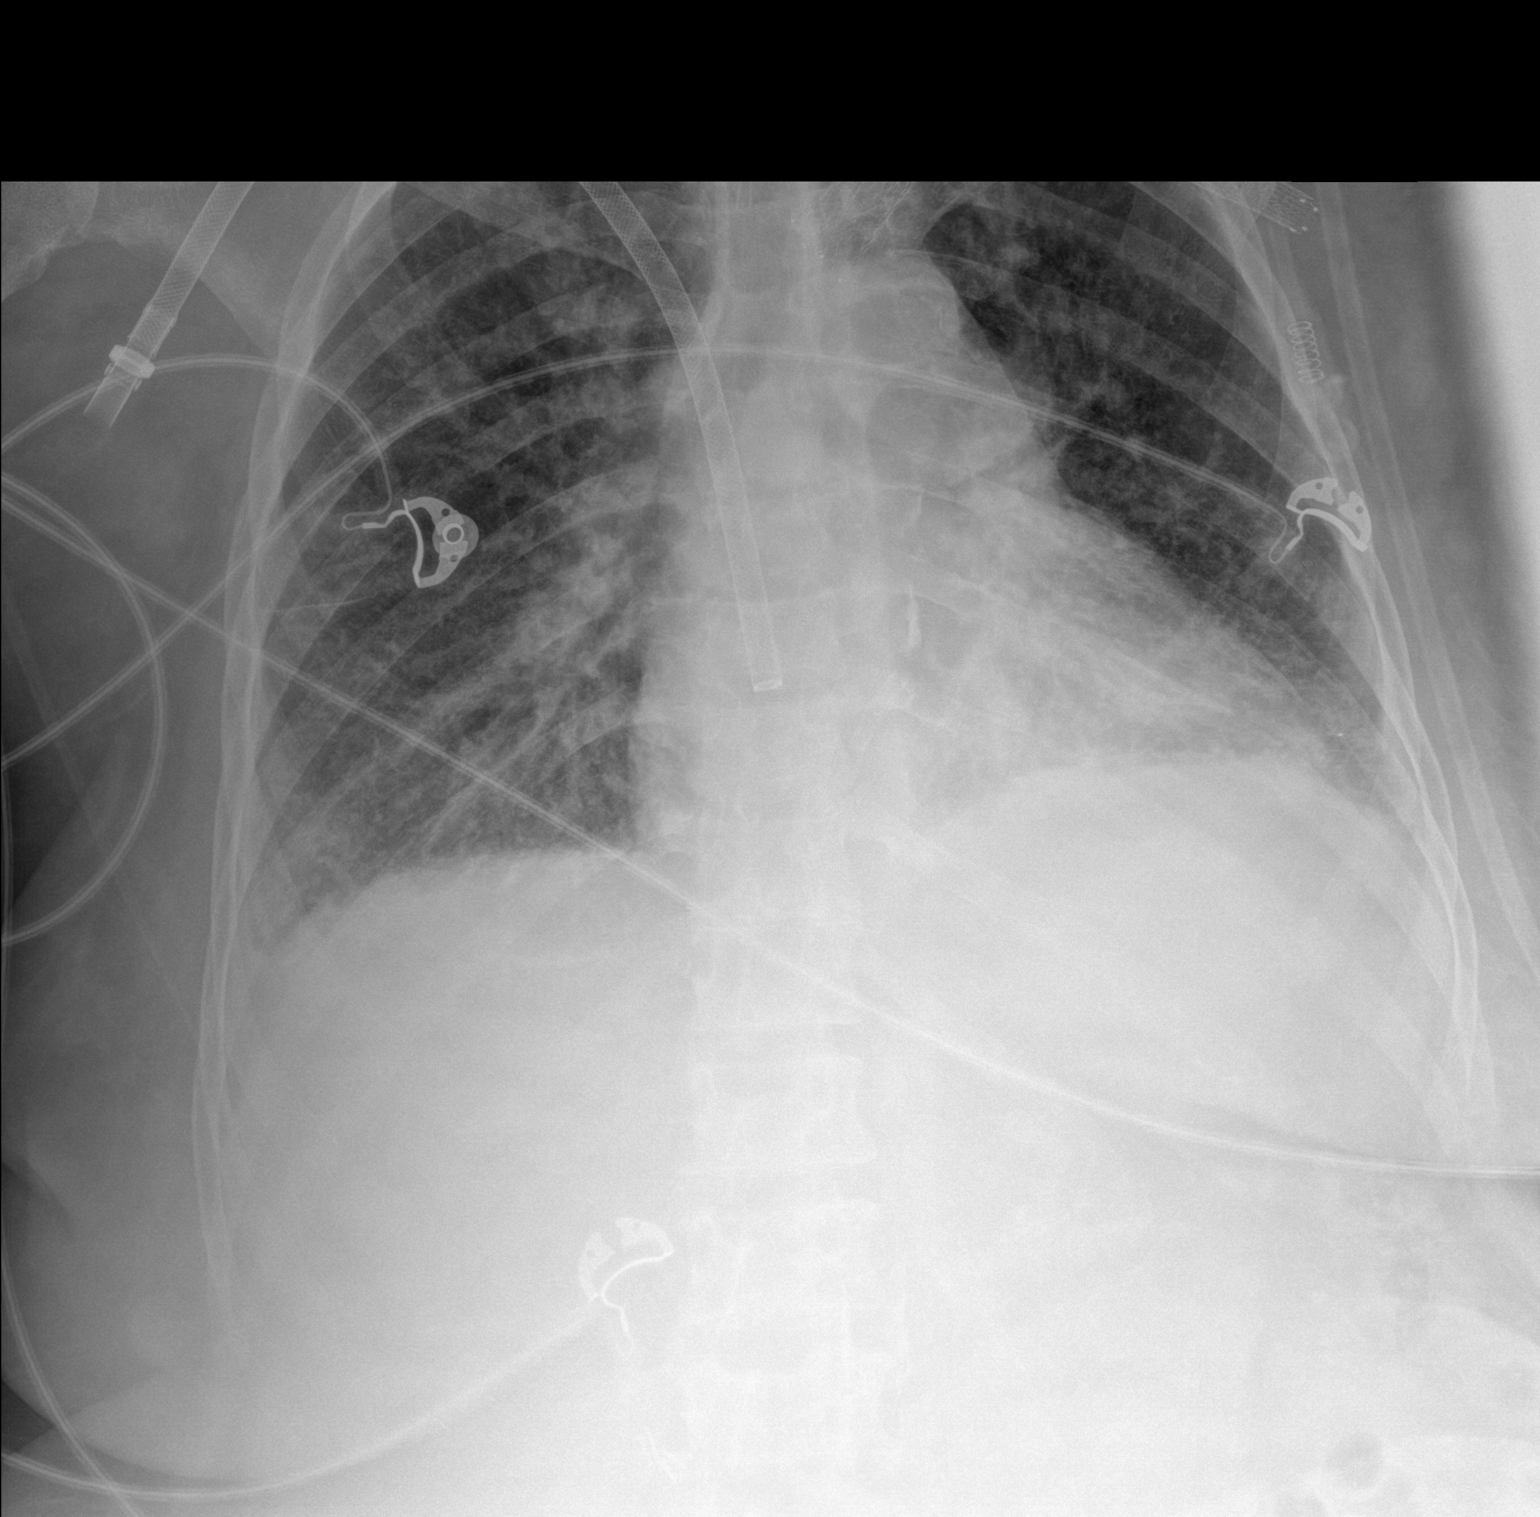

[1 of 1 positions shown; findings below may reference images not displayed]

FINDINGS: Endotracheal tube is been placed with its tip 5.1 cm above the
carina. Right internal jugular HeRO graft venous limb is seen with
its tip within the right atrium. Left upper extremity central venous
stenting has been performed. Minimal left basilar atelectasis. Lungs
are otherwise clear. No pneumothorax or pleural effusion. Cardiac
size within normal limits. Pulmonary vascularity is normal.
IMPRESSION: No active disease.

## 2020-10-19 ENCOUNTER — Telehealth: Payer: Self-pay

## 2020-10-19 DIAGNOSIS — N186 End stage renal disease: Secondary | ICD-10-CM | POA: Diagnosis not present

## 2020-10-19 DIAGNOSIS — I5030 Unspecified diastolic (congestive) heart failure: Secondary | ICD-10-CM | POA: Diagnosis not present

## 2020-10-19 DIAGNOSIS — I823 Embolism and thrombosis of renal vein: Secondary | ICD-10-CM | POA: Diagnosis not present

## 2020-10-19 DIAGNOSIS — I132 Hypertensive heart and chronic kidney disease with heart failure and with stage 5 chronic kidney disease, or end stage renal disease: Secondary | ICD-10-CM | POA: Diagnosis not present

## 2020-10-19 DIAGNOSIS — J449 Chronic obstructive pulmonary disease, unspecified: Secondary | ICD-10-CM | POA: Diagnosis not present

## 2020-10-19 DIAGNOSIS — F32A Depression, unspecified: Secondary | ICD-10-CM | POA: Diagnosis not present

## 2020-10-19 DIAGNOSIS — I2510 Atherosclerotic heart disease of native coronary artery without angina pectoris: Secondary | ICD-10-CM | POA: Diagnosis not present

## 2020-10-19 DIAGNOSIS — D631 Anemia in chronic kidney disease: Secondary | ICD-10-CM | POA: Diagnosis not present

## 2020-10-19 DIAGNOSIS — M4624 Osteomyelitis of vertebra, thoracic region: Secondary | ICD-10-CM | POA: Diagnosis not present

## 2020-10-19 NOTE — Telephone Encounter (Signed)
Phone call placed to [place] to follow up on order for hospital bed. Per [facility], bed is out for delivery for this Monday. Patient will receive a call on Monday with time frame. Patient made aware.

## 2020-10-20 DIAGNOSIS — N186 End stage renal disease: Secondary | ICD-10-CM | POA: Diagnosis not present

## 2020-10-20 DIAGNOSIS — G0491 Myelitis, unspecified: Secondary | ICD-10-CM | POA: Diagnosis not present

## 2020-10-20 DIAGNOSIS — Z992 Dependence on renal dialysis: Secondary | ICD-10-CM | POA: Diagnosis not present

## 2020-10-20 IMAGING — DX DG CHEST 1V PORT
1 series · 1 of 1 positions shown · non-contrast
Comparison: June 12, 2020

CLINICAL DATA: Hypoxia.

EXAM:
PORTABLE CHEST 1 VIEW

[chest ap]
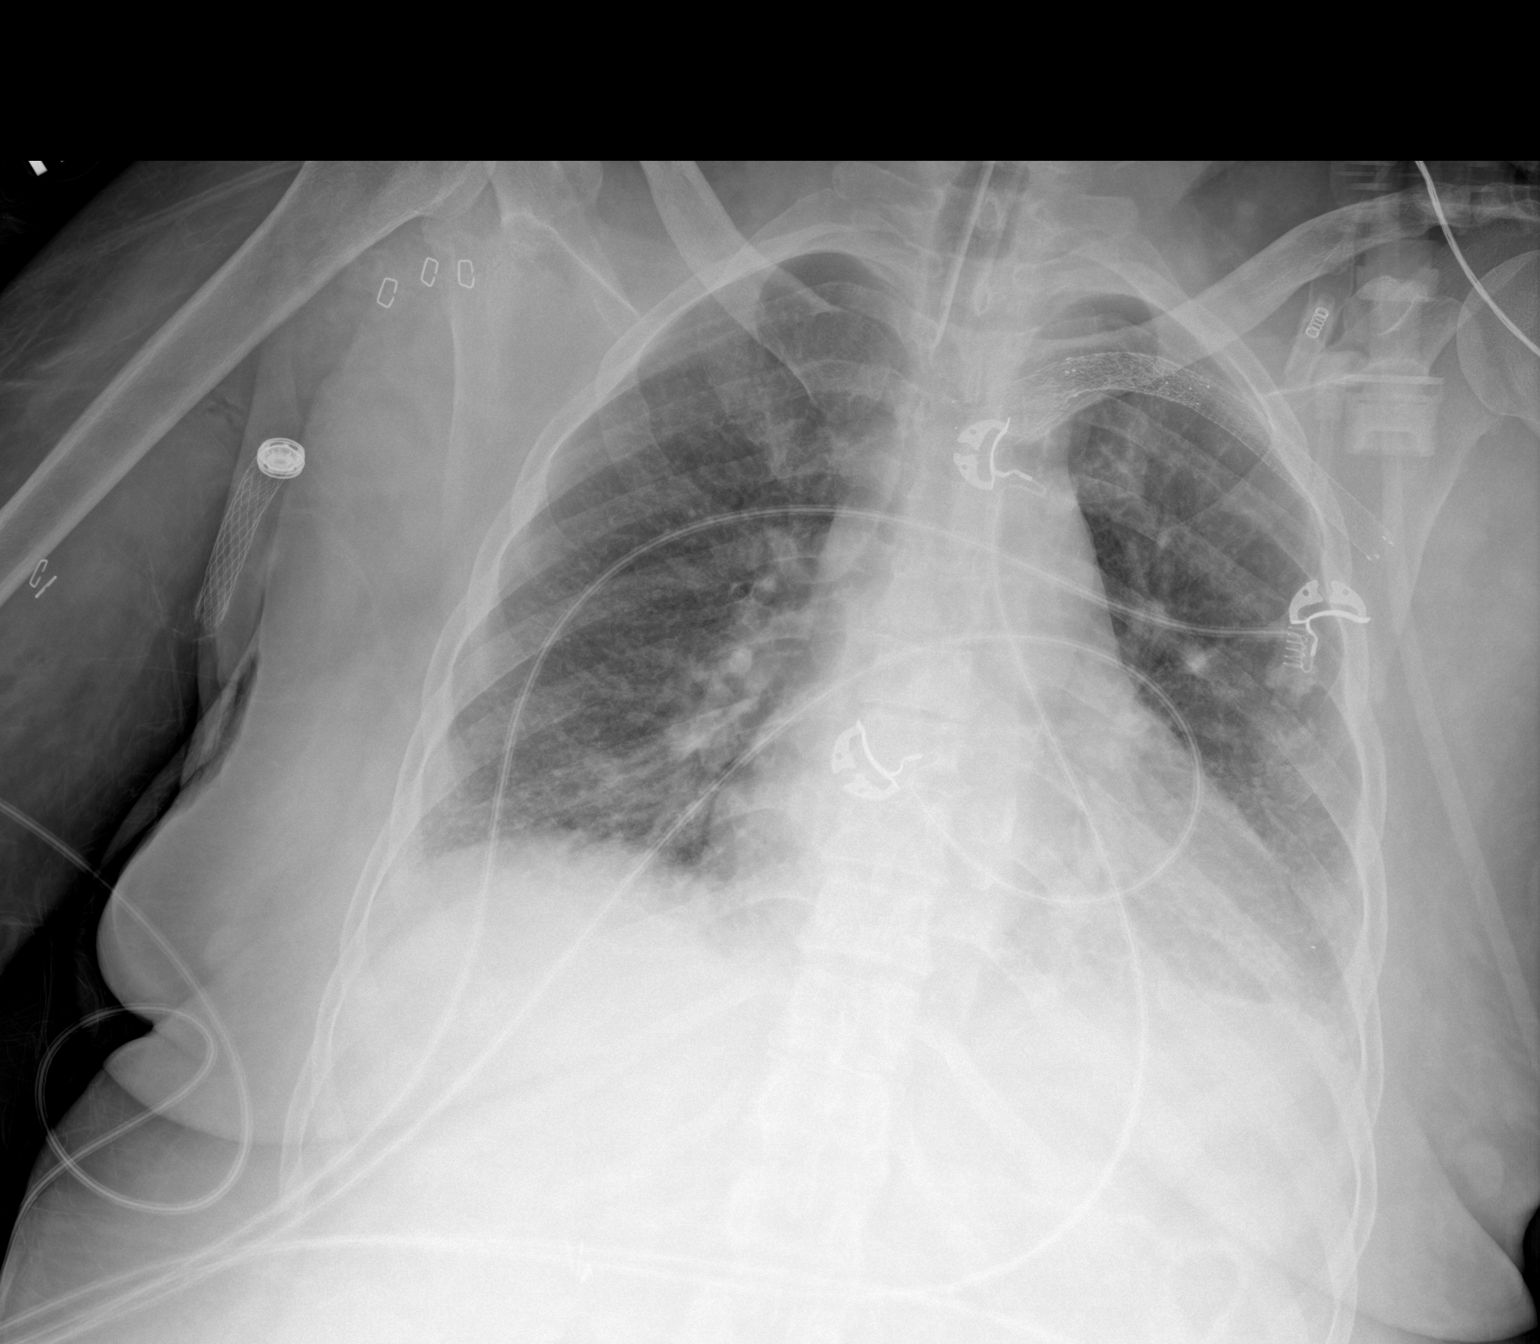

[1 of 1 positions shown; findings below may reference images not displayed]

FINDINGS: Endotracheal tube tip is 5.2 cm above the carina. No pneumothorax.
There is atelectatic change in the left base. There is no edema or
airspace opacity. Heart is borderline enlarged with pulmonary
vascularity normal. No adenopathy. There is aortic atherosclerosis.
There is a left subclavian and axillary stent.
IMPRESSION: Endotracheal tube as described without pneumothorax. Left base
atelectasis. Lungs elsewhere clear. Stable cardiac prominence.

Aortic Atherosclerosis (39FM0-ZB1.1).

## 2020-10-22 ENCOUNTER — Telehealth (INDEPENDENT_AMBULATORY_CARE_PROVIDER_SITE_OTHER): Payer: Self-pay

## 2020-10-22 ENCOUNTER — Telehealth: Payer: Self-pay | Admitting: Primary Care

## 2020-10-22 DIAGNOSIS — E1142 Type 2 diabetes mellitus with diabetic polyneuropathy: Secondary | ICD-10-CM | POA: Diagnosis not present

## 2020-10-22 DIAGNOSIS — N186 End stage renal disease: Secondary | ICD-10-CM | POA: Diagnosis not present

## 2020-10-22 DIAGNOSIS — B351 Tinea unguium: Secondary | ICD-10-CM | POA: Diagnosis not present

## 2020-10-22 DIAGNOSIS — Z992 Dependence on renal dialysis: Secondary | ICD-10-CM | POA: Diagnosis not present

## 2020-10-22 IMAGING — CT CT ABD-PELV W/O CM
2 of 4 series · 17 of 46 positions shown, 19 images · non-contrast
Comparison: 04/06/2020

CLINICAL DATA: Severe abdominal pain. Suspicion for retroperitoneal
hemorrhage.

EXAM:
CT ABDOMEN AND PELVIS WITHOUT CONTRAST
TECHNIQUE: Multidetector CT imaging of the abdomen and pelvis was performed
following the standard protocol without IV contrast.

[Series 2: routine abd/pel wo · axial · 0.82mm/px · z∈[-388,+22]mm · 14 of 90 slices shown, 16 images]
[im 4/90  soft-tissue]
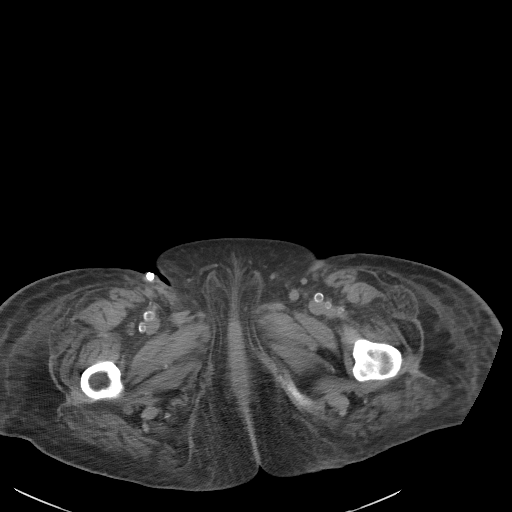
[im 4/90  bone]
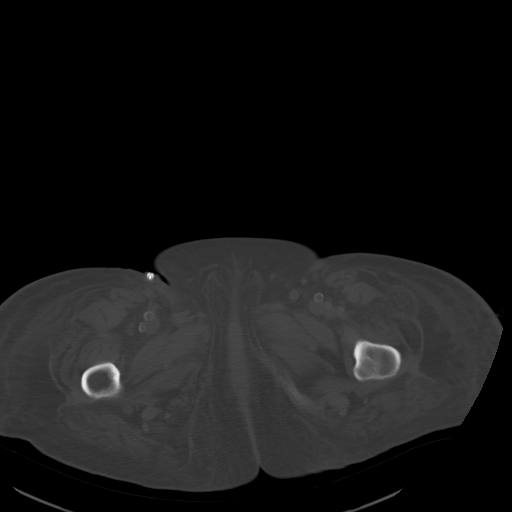
[im 12/90  soft-tissue]
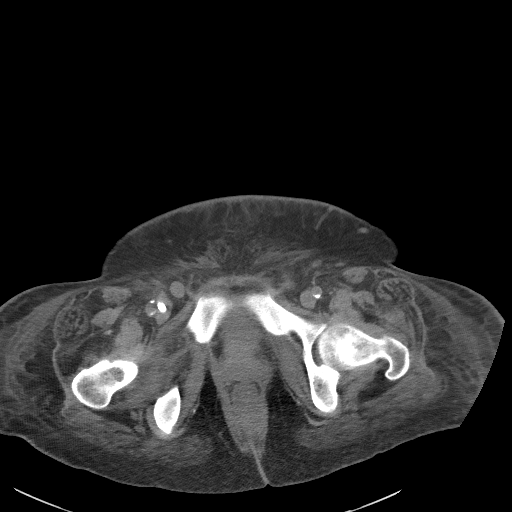
[im 16/90  soft-tissue]
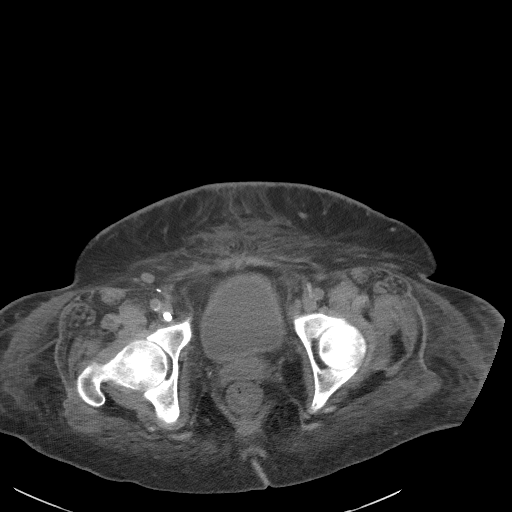
[im 24/90  soft-tissue]
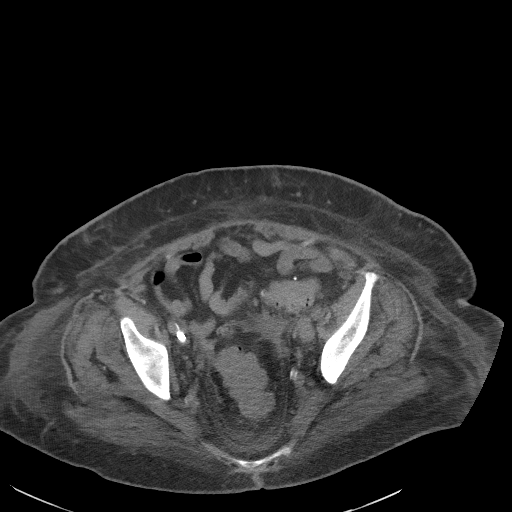
[im 31/90  soft-tissue]
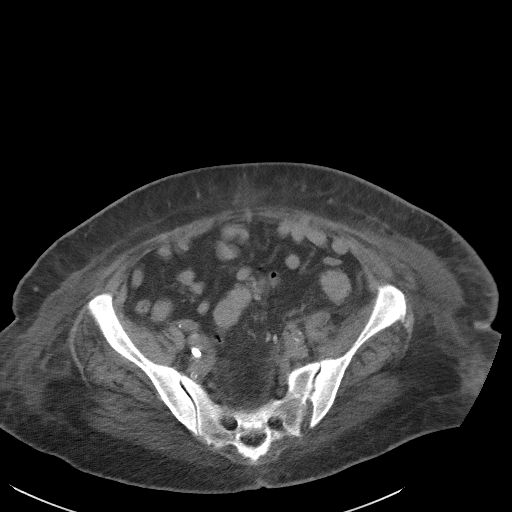
[im 35/90  soft-tissue]
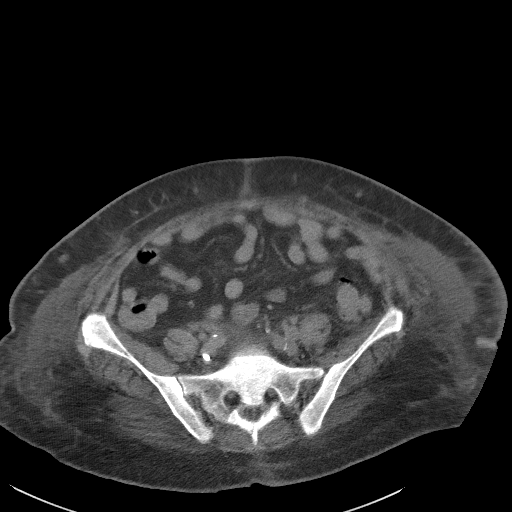
[im 43/90  soft-tissue]
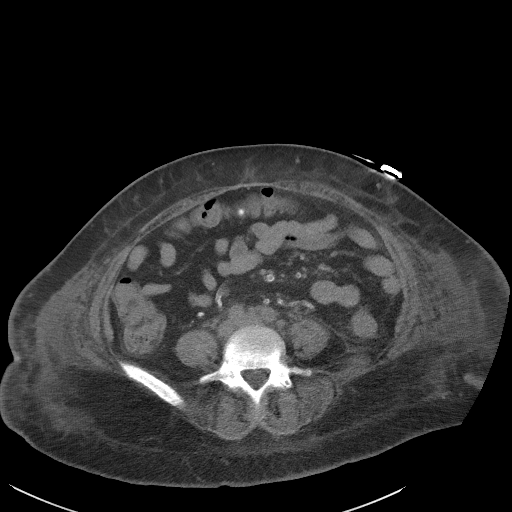
[im 47/90  soft-tissue]
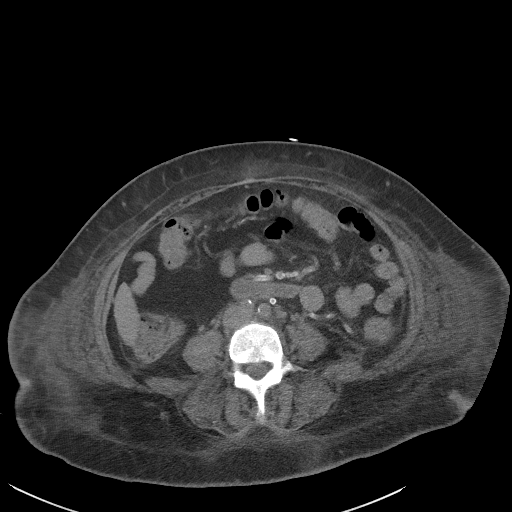
[im 55/90  soft-tissue]
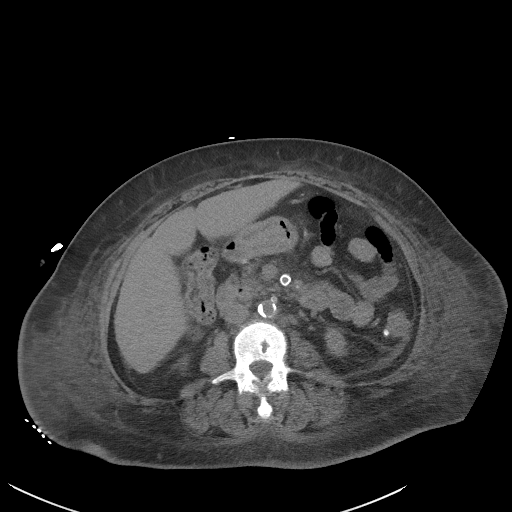
[im 55/90  bone]
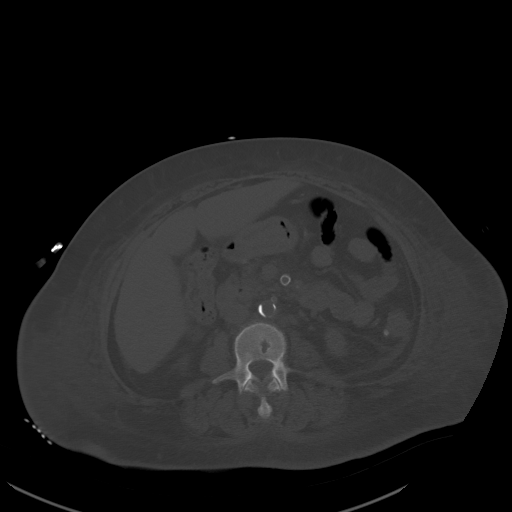
[im 59/90  soft-tissue]
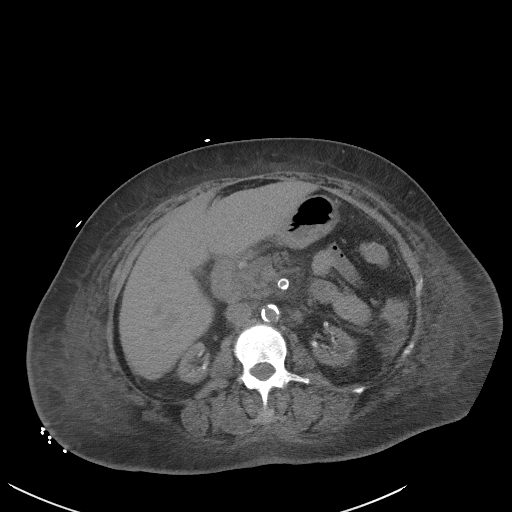
[im 66/90  soft-tissue]
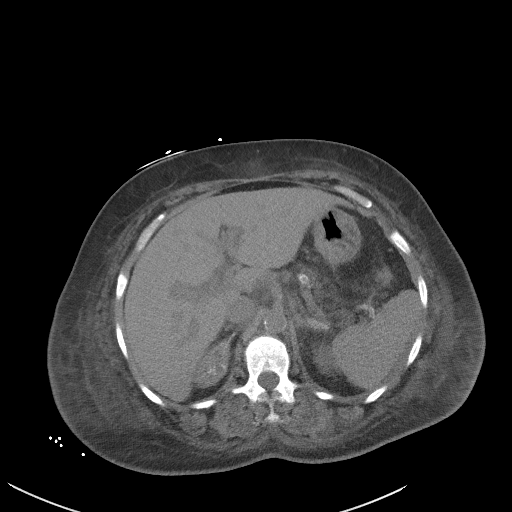
[im 74/90  soft-tissue]
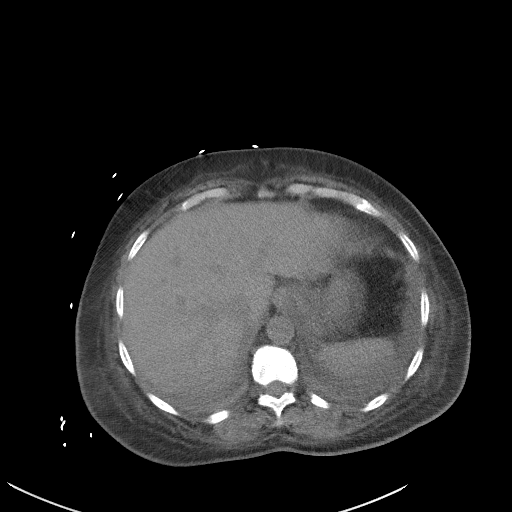
[im 78/90  soft-tissue]
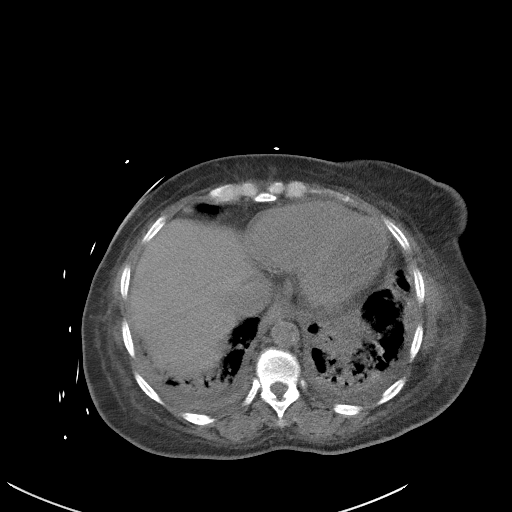
[im 86/90  soft-tissue]
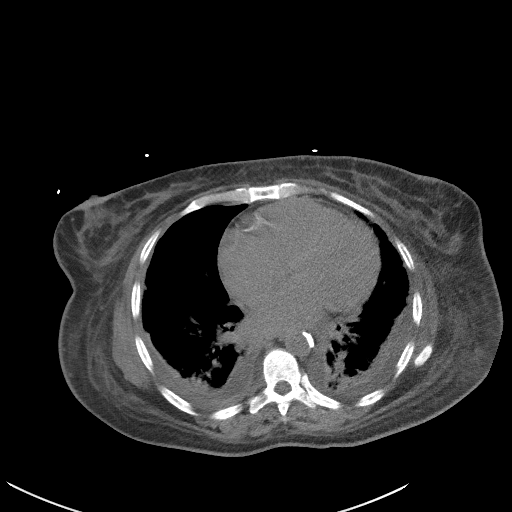

[Series 5: coronal st · coronal · 0.87mm/px · 3 of 98 slices shown]
[im 33/98  soft-tissue]
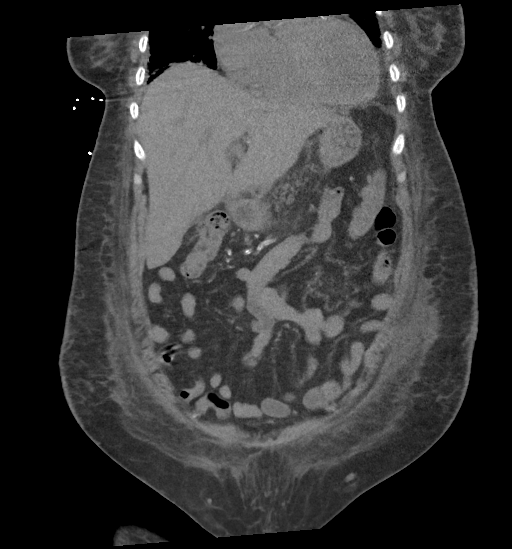
[im 44/98  soft-tissue]
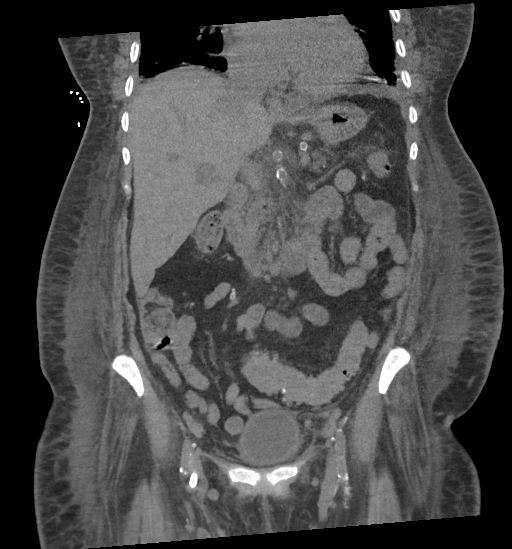
[im 54/98  soft-tissue]
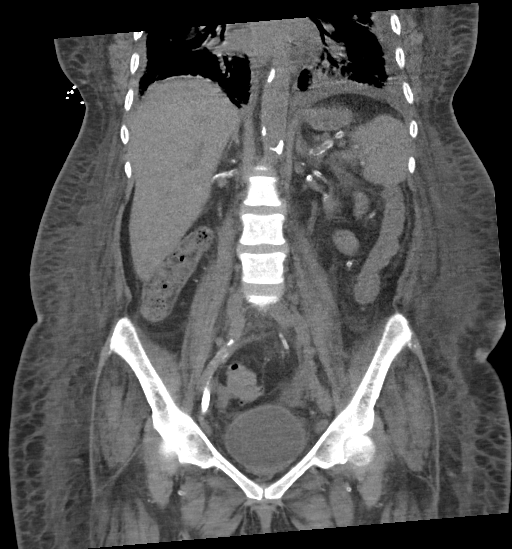

[17 of 46 positions shown; findings below may reference images not displayed]

FINDINGS: Lower chest: New small bilateral pleural effusions and bibasilar
infiltrates noted.

Hepatobiliary:  No mass visualized on this unenhanced exam.

Pancreas: No mass or inflammatory process visualized on this
unenhanced exam.

Spleen:  Within normal limits in size.

Adrenals/Urinary tract: Diffuse bilateral renal parenchymal atrophy,
consistent with end-stage renal disease. No evidence of urolithiasis
or hydronephrosis. Unremarkable unopacified urinary bladder.

Stomach/Bowel: No evidence of obstruction, inflammatory process, or
abnormal fluid collections. Colonic diverticulosis is again seen,
without evidence of diverticulitis.

Vascular/Lymphatic: No pathologically enlarged lymph nodes
identified. No evidence of abdominal aortic aneurysm. Aortic
atherosclerosis noted.

Reproductive: Prior hysterectomy noted. Adnexal regions are
unremarkable in appearance.

Other: New moderate diffuse body wall edema and mild mesenteric
edema. No evidence of retroperitoneal hemorrhage or ascites.

Musculoskeletal:  No suspicious bone lesions identified.
IMPRESSION: New small bilateral pleural effusions, diffuse body wall edema, and
mild mesenteric edema, consistent with 3rd spacing.

New bibasilar atelectasis versus infiltrates.

No evidence of retroperitoneal hemorrhage.

Colonic diverticulosis, without radiographic evidence of
diverticulitis.

Aortic Atherosclerosis (GPWVG-UK6.6).

## 2020-10-22 NOTE — Telephone Encounter (Signed)
Patient calls to report the bed ordered has not been delivered. She has to sleep in a recliner. Bed ordered from [facility] and we were told it was en route last week. Will t/c in am to f/u on delivery.

## 2020-10-23 ENCOUNTER — Encounter (INDEPENDENT_AMBULATORY_CARE_PROVIDER_SITE_OTHER): Payer: Self-pay

## 2020-10-23 NOTE — Telephone Encounter (Signed)
Due to unforeseen circumstances patient procedure was moved to 11/07/20. Patient was offered 10/31/20 but was able to take that date. Patient has been made aware with dates and time.

## 2020-10-24 ENCOUNTER — Other Ambulatory Visit: Payer: Medicare HMO

## 2020-10-24 DIAGNOSIS — I2510 Atherosclerotic heart disease of native coronary artery without angina pectoris: Secondary | ICD-10-CM | POA: Diagnosis not present

## 2020-10-24 DIAGNOSIS — F32A Depression, unspecified: Secondary | ICD-10-CM | POA: Diagnosis not present

## 2020-10-24 DIAGNOSIS — I132 Hypertensive heart and chronic kidney disease with heart failure and with stage 5 chronic kidney disease, or end stage renal disease: Secondary | ICD-10-CM | POA: Diagnosis not present

## 2020-10-24 DIAGNOSIS — D631 Anemia in chronic kidney disease: Secondary | ICD-10-CM | POA: Diagnosis not present

## 2020-10-24 DIAGNOSIS — M4624 Osteomyelitis of vertebra, thoracic region: Secondary | ICD-10-CM | POA: Diagnosis not present

## 2020-10-24 DIAGNOSIS — N186 End stage renal disease: Secondary | ICD-10-CM | POA: Diagnosis not present

## 2020-10-24 DIAGNOSIS — I5030 Unspecified diastolic (congestive) heart failure: Secondary | ICD-10-CM | POA: Diagnosis not present

## 2020-10-24 DIAGNOSIS — I823 Embolism and thrombosis of renal vein: Secondary | ICD-10-CM | POA: Diagnosis not present

## 2020-10-24 DIAGNOSIS — J449 Chronic obstructive pulmonary disease, unspecified: Secondary | ICD-10-CM | POA: Diagnosis not present

## 2020-10-26 DIAGNOSIS — N186 End stage renal disease: Secondary | ICD-10-CM

## 2020-10-29 ENCOUNTER — Other Ambulatory Visit: Payer: Medicare HMO

## 2020-10-29 ENCOUNTER — Other Ambulatory Visit: Payer: Self-pay

## 2020-10-29 ENCOUNTER — Ambulatory Visit
Admission: RE | Admit: 2020-10-29 | Discharge: 2020-10-29 | Disposition: A | Discharge status: Home / Self Care | Payer: Medicare HMO | Source: Ambulatory Visit | Attending: Urology | Admitting: Urology

## 2020-10-29 DIAGNOSIS — N2889 Other specified disorders of kidney and ureter: Secondary | ICD-10-CM | POA: Diagnosis not present

## 2020-11-01 ENCOUNTER — Ambulatory Visit: Payer: Medicare HMO | Admitting: Urology

## 2020-11-01 NOTE — Telephone Encounter (Signed)
Patient's arrival time to the [facility] on 11/07/20 with Dr. Delana Meyer has changed from 9:00 am to 10:00 am. Patient has been informed.

## 2020-11-02 DIAGNOSIS — I2510 Atherosclerotic heart disease of native coronary artery without angina pectoris: Secondary | ICD-10-CM | POA: Diagnosis not present

## 2020-11-02 DIAGNOSIS — I5030 Unspecified diastolic (congestive) heart failure: Secondary | ICD-10-CM | POA: Diagnosis not present

## 2020-11-02 DIAGNOSIS — F32A Depression, unspecified: Secondary | ICD-10-CM | POA: Diagnosis not present

## 2020-11-02 DIAGNOSIS — D631 Anemia in chronic kidney disease: Secondary | ICD-10-CM | POA: Diagnosis not present

## 2020-11-02 DIAGNOSIS — J449 Chronic obstructive pulmonary disease, unspecified: Secondary | ICD-10-CM | POA: Diagnosis not present

## 2020-11-02 DIAGNOSIS — I823 Embolism and thrombosis of renal vein: Secondary | ICD-10-CM | POA: Diagnosis not present

## 2020-11-02 DIAGNOSIS — N186 End stage renal disease: Secondary | ICD-10-CM | POA: Diagnosis not present

## 2020-11-02 DIAGNOSIS — M4624 Osteomyelitis of vertebra, thoracic region: Secondary | ICD-10-CM | POA: Diagnosis not present

## 2020-11-02 DIAGNOSIS — I132 Hypertensive heart and chronic kidney disease with heart failure and with stage 5 chronic kidney disease, or end stage renal disease: Secondary | ICD-10-CM | POA: Diagnosis not present

## 2020-11-05 ENCOUNTER — Other Ambulatory Visit
Admission: RE | Admit: 2020-11-05 | Discharge: 2020-11-05 | Disposition: A | Discharge status: Home / Self Care | Payer: Medicare HMO | Source: Ambulatory Visit | Attending: Vascular Surgery | Admitting: Vascular Surgery

## 2020-11-05 ENCOUNTER — Encounter (INDEPENDENT_AMBULATORY_CARE_PROVIDER_SITE_OTHER): Payer: Medicare HMO

## 2020-11-05 ENCOUNTER — Other Ambulatory Visit: Payer: Self-pay

## 2020-11-05 ENCOUNTER — Ambulatory Visit (INDEPENDENT_AMBULATORY_CARE_PROVIDER_SITE_OTHER): Payer: Medicare HMO | Admitting: Vascular Surgery

## 2020-11-05 DIAGNOSIS — Z20822 Contact with and (suspected) exposure to covid-19: Secondary | ICD-10-CM | POA: Diagnosis not present

## 2020-11-05 DIAGNOSIS — Z01812 Encounter for preprocedural laboratory examination: Secondary | ICD-10-CM | POA: Diagnosis present

## 2020-11-06 ENCOUNTER — Other Ambulatory Visit (INDEPENDENT_AMBULATORY_CARE_PROVIDER_SITE_OTHER): Payer: Self-pay | Admitting: Nurse Practitioner

## 2020-11-06 LAB — SARS CORONAVIRUS 2 (TAT 6-24 HRS): SARS Coronavirus 2: NEGATIVE

## 2020-11-07 ENCOUNTER — Other Ambulatory Visit: Payer: Self-pay

## 2020-11-07 ENCOUNTER — Encounter: Payer: Self-pay | Admitting: Vascular Surgery

## 2020-11-07 ENCOUNTER — Encounter
Admission: RE | Disposition: A | Discharge status: Home / Self Care | Payer: Self-pay | Source: Home / Self Care | Attending: Vascular Surgery

## 2020-11-07 ENCOUNTER — Telehealth (INDEPENDENT_AMBULATORY_CARE_PROVIDER_SITE_OTHER): Payer: Self-pay | Admitting: Vascular Surgery

## 2020-11-07 ENCOUNTER — Ambulatory Visit
Admission: RE | Admit: 2020-11-07 | Discharge: 2020-11-07 | Disposition: A | Discharge status: Home / Self Care | Payer: Medicare HMO | Attending: Vascular Surgery | Admitting: Vascular Surgery

## 2020-11-07 DIAGNOSIS — N186 End stage renal disease: Secondary | ICD-10-CM | POA: Insufficient documentation

## 2020-11-07 DIAGNOSIS — Z539 Procedure and treatment not carried out, unspecified reason: Secondary | ICD-10-CM | POA: Insufficient documentation

## 2020-11-07 LAB — GLUCOSE, CAPILLARY
Glucose-Capillary: 50 mg/dL — ABNORMAL LOW (ref 70–99)
Glucose-Capillary: 91 mg/dL (ref 70–99)
Glucose-Capillary: 61 mg/dL — ABNORMAL LOW (ref 70–99)
Glucose-Capillary: 96 mg/dL (ref 70–99)

## 2020-11-07 SURGERY — UPPER EXTREMITY ANGIOGRAPHY
Anesthesia: Moderate Sedation | Laterality: Bilateral

## 2020-11-07 MED ORDER — HYDROMORPHONE HCL 1 MG/ML IJ SOLN: 1.0000 mg | Freq: Once | INTRAMUSCULAR | Status: DC | PRN

## 2020-11-07 MED ORDER — HEPARIN SODIUM (PORCINE) 1000 UNIT/ML IJ SOLN
INTRAMUSCULAR | Status: AC
  Filled 2020-11-07: qty 1

## 2020-11-07 MED ORDER — SODIUM CHLORIDE 0.9 % IV SOLN: INTRAVENOUS | Status: DC

## 2020-11-07 MED ORDER — DEXTROSE 50 % IV SOLN
INTRAVENOUS | Status: AC
  Administered 2020-11-07: 25 mL via INTRAVENOUS
  Filled 2020-11-07: qty 50

## 2020-11-07 MED ORDER — FAMOTIDINE 20 MG PO TABS: 40.0000 mg | ORAL_TABLET | Freq: Once | ORAL | Status: DC | PRN

## 2020-11-07 MED ORDER — METHYLPREDNISOLONE SODIUM SUCC 125 MG IJ SOLR: 125.0000 mg | Freq: Once | INTRAMUSCULAR | Status: DC | PRN

## 2020-11-07 MED ORDER — DEXTROSE 50 % IV SOLN: 25.0000 mL | Freq: Once | INTRAVENOUS | Status: AC

## 2020-11-07 MED ORDER — DIPHENHYDRAMINE HCL 50 MG/ML IJ SOLN: 50.0000 mg | Freq: Once | INTRAMUSCULAR | Status: DC | PRN

## 2020-11-07 MED ORDER — MIDAZOLAM HCL 2 MG/ML PO SYRP: 8.0000 mg | ORAL_SOLUTION | Freq: Once | ORAL | Status: DC | PRN

## 2020-11-07 MED ORDER — CEFAZOLIN SODIUM-DEXTROSE 2-4 GM/100ML-% IV SOLN: 2.0000 g | Freq: Once | INTRAVENOUS | Status: DC

## 2020-11-07 NOTE — Progress Notes (Signed)
Team member, Sharyn Lull, at bedside discussing with patient that procedure will need to be rescheduled and patient verbalizes understanding.  Patient calling son and providing update and tolerating juice.  Lunch available.

## 2020-11-07 NOTE — Telephone Encounter (Signed)
Patient called to speak with Mickel Baas or April. I made her aware that both were in clinic with patients. She told me to leave a message make [place] aware that she needs to change her procedure date.    Mickel Baas has been made aware (teams)  This note is for documentation purposes only.

## 2020-11-07 NOTE — Progress Notes (Signed)
Patient tolerated lunch and discharged to home with plan to reschedule procedure.

## 2020-11-07 NOTE — Progress Notes (Signed)
Received message from team that there is a delay in patient procedure start and updated patient about delay and she verbalized understanding message.

## 2020-11-12 ENCOUNTER — Encounter (INDEPENDENT_AMBULATORY_CARE_PROVIDER_SITE_OTHER): Payer: Self-pay

## 2020-11-12 ENCOUNTER — Telehealth (INDEPENDENT_AMBULATORY_CARE_PROVIDER_SITE_OTHER): Payer: Self-pay

## 2020-11-12 NOTE — Telephone Encounter (Signed)
Patient procedure has been reschedule to 11/27/20 and Covid-19 testing is schedule for 11/23/20 between 8-1 pm. Pre-procedure instructions will be faxed over to the patient dialysis and patient has been made aware

## 2020-11-14 ENCOUNTER — Other Ambulatory Visit: Payer: Self-pay

## 2020-11-14 ENCOUNTER — Other Ambulatory Visit: Payer: Medicaid Other | Admitting: Primary Care

## 2020-11-14 DIAGNOSIS — E1022 Type 1 diabetes mellitus with diabetic chronic kidney disease: Secondary | ICD-10-CM

## 2020-11-14 DIAGNOSIS — I739 Peripheral vascular disease, unspecified: Secondary | ICD-10-CM

## 2020-11-14 DIAGNOSIS — Z992 Dependence on renal dialysis: Secondary | ICD-10-CM

## 2020-11-14 DIAGNOSIS — N186 End stage renal disease: Secondary | ICD-10-CM

## 2020-11-14 DIAGNOSIS — Z515 Encounter for palliative care: Secondary | ICD-10-CM

## 2020-11-14 DIAGNOSIS — I2510 Atherosclerotic heart disease of native coronary artery without angina pectoris: Secondary | ICD-10-CM

## 2020-11-14 NOTE — Progress Notes (Signed)
[facility] Palliative Care Consult Note Telephone: [phone]  Fax: [phone]    Date of encounter: 11/14/20 PATIENT NAME: [name] [address] [place] [address] [phone] (home)  DOB: 02-08-1961 MRN: [id number]  PRIMARY CARE PROVIDER:    Denton Lank, MD,  [place]. [address] [place] [address] [phone]  REFERRING PROVIDER:   Denton Lank, MD [address] [place],  [place] [address] [phone]  RESPONSIBLE PARTY:   Extended Emergency Contact Information Primary Emergency Contact: Santillanes,Carl Address: [address]          [place], [place] [address] [place] Phone: [phone] Relation: Son  I met face to face with patient in  home. Palliative Care was asked to follow this patient by consultation request of Denton Lank, MD to help address advance care planning and goals of care. This is a follow up  visit.   ASSESSMENT AND RECOMMENDATIONS:   1. Advance Care Planning/Goals of Care: Goals include to maximize quality of life and symptom management. Our advance care planning conversation included a discussion about:     The value and importance of advance care planning   Experiences with loved ones who have been seriously ill or have died   Exploration of personal, cultural or spiritual beliefs that might influence medical decisions   Exploration of goals of care in the event of a sudden injury or illness   Identification and preparation of a healthcare agent   Review of an  advance directive document - MOST.  Discussed DNR but wants to wait to formalize.  We discussed advance directives. She states her son is resistant,  feeling that it is too early to be discussing advanced care plans or funeral plans. Patient feels that she needs to make funeral plans and pay for her cremation. She states she would like her ashes scattered in [place] as her grandfather was part [place] tribe  Native Optometrist.   Patient states her son becomes very emotional at these discussions period we discussed her advance care planning and what she would want from the health system. She states she was resuscitated and put on life support and tube fed,  everything that is on the most form, previously. She states she does not want this to happen again and that she would elect not to be resuscitated. However she is torn because of her son's desires for extending life. Ultimately she would like to consider the choices and we will discuss on our next visit.  2. Symptom Management:   I met with patient and her apartment home. She was up ambulating with her cane. She had been working on some gardening. She endorsed some insomnia at having been up since 4:00 AM. She states she sometimes has trouble sleeping. She outlined a recent trip to the hospital for the procedure of a shunt placement. However her case was postponed and she had to reschedule due to an emergency surgery. She will be going in March.   She endorses continued lymphoedema of the legs, She states she is hoping for some lymph-edema pumps. She sometimes massages her legs and this feels better period she did finally receive a hospital bed so she can elevate her legs which has helped with pain and edema management.   3. Follow up Palliative Care Visit: Palliative care will continue to follow for goals of care clarification and symptom management. Return 4-6 weeks or prn.  4. Family /Caregiver/Community Supports:  Son is POA, lives in subsidized housing with friends/family support.   5. Cognitive / Functional decline:  A and O x 3, able to do most adls, needs some assistance with iadls.  I spent 60 minutes providing this consultation,  from 1430 to 1530. More than 50% of the time in this consultation was spent in counseling and care coordination.  CODE STATUS: FULL Code  PPS: 50%  HOSPICE ELIGIBILITY/DIAGNOSIS: TBD  Subjective:  CHIEF  COMPLAINT: debility, ESRD  HISTORY OF PRESENT ILLNESS:  [name] is a 60 y.o. year old female  with esrd, debility and deconditioning, self care deficits. She is living alone with family support.   We are asked to consult around advance care planning and complex medical decision making.    Review and summarization of old Epic records shows or history from other than patient.  History obtained from review of EMR, discussion with primary team, and  interview with family, caregiver  and/or [name]. Records reviewed and summarized above.   CURRENT PROBLEM LIST:  Patient Active Problem List   Diagnosis Date Noted  . Bradycardia 09/17/2020  . Advanced care planning/counseling discussion   . Discitis of thoracic region   . Fluid overload 08/10/2020  . HLD (hyperlipidemia) 08/10/2020  . Chest pain 08/10/2020  . CAD (coronary artery disease) 08/10/2020  . GERD (gastroesophageal reflux disease) 08/10/2020  . Obesity (BMI 30-39.9) 06/06/2020  . Anemia in ESRD (end-stage renal disease) ([place]) 05/31/2020  . Type 1 diabetes mellitus ([place]) 05/31/2020  . End-stage renal disease ([place]) 05/31/2020  . Hypertensive disorder 05/31/2020  . Arm pain 05/21/2020  . Cellulitis of right leg 05/02/2020  . ESRD (end stage renal disease) ([place]) 04/13/2020  . SOBOE (shortness of breath on exertion) 02/08/2020  . Hematoma of arm, left, subsequent encounter 11/24/2019  . Respiratory failure ([place])   . Shortness of breath   . Complication of arteriovenous dialysis fistula 11/13/2019  . PVD (peripheral vascular disease) ([place]) 11/13/2019  . Severe anemia 11/13/2019  . Pressure injury of skin 11/13/2019  . ESRD on hemodialysis ([place])   . Hemorrhagic shock ([place]) 11/11/2019  . Leg pain 10/02/2019  . CAP (community acquired pneumonia) 10/01/2019  . Atherosclerosis of native arteries of extremity with intermittent claudication ([place]) 05/01/2019  . Right-sided headache   . COPD (chronic obstructive pulmonary disease)  ([place]) 10/06/2018  . Syncope 10/04/2018  . Symptomatic anemia 06/18/2018  . Gastroparesis due to DM ([place]) 01/18/2018  . Complication of vascular access for dialysis 12/04/2017  . Osteomyelitis ([place]) 09/30/2017  . Carotid stenosis 06/18/2017  . Bilateral carotid artery stenosis 06/16/2017  . Benign essential HTN 05/19/2017  . CKD (chronic kidney disease) stage 5, GFR less than 15 ml/min (HCC) 05/19/2017  . Hyperlipidemia, mixed 05/19/2017  . Shortness of breath 05/04/2017  . Rash, skin 12/16/2016  . Cellulitis of lower extremity 07/29/2016  . Chronic venous insufficiency 07/29/2016  . Lymphedema 07/29/2016  . TIA (transient ischemic attack) 04/21/2016  . Altered mental status 04/08/2016  . Hyperammonemia ([place]) 04/08/2016  . Elevated troponin 04/08/2016  . Depression 04/08/2016  . Depression, major, recurrent, severe with psychosis ([place]) 04/08/2016  . Blood in stool   . Intractable cyclical vomiting with nausea   . Reflux esophagitis   . Gastritis   . Generalized abdominal pain   . Uncontrollable vomiting   . Major depressive disorder, recurrent episode, moderate ([place]) 03/15/2016  . Adjustment disorder with mixed anxiety and depressed mood 03/15/2016  . Malnutrition of moderate degree 12/01/2015  .  Renal mass   . Dyspnea   . Acute renal failure ([place])   . Respiratory failure ([place])   . High temperature 11/14/2015  . Encounter for central line placement   . Encounter for orogastric (OG) tube placement   . Nausea 11/12/2015  . Hyperkalemia 10/03/2015  . Diarrhea, unspecified 07/22/2015  . Pneumonia 05/21/2015  . Hypoglycemia 04/24/2015  . Unresponsiveness 04/24/2015  . Sinus bradycardia 04/24/2015  . Hypothermia 04/24/2015  . Acute respiratory failure ([place]) 04/24/2015  . Acute on chronic diastolic CHF (congestive heart failure) ([place]) 04/05/2015  . Diabetic gastroparesis ([place]) 04/05/2015  . Hypokalemia 04/05/2015  . Generalized weakness 04/05/2015  . Acute pulmonary edema  ([place]) 04/03/2015  . Nausea and vomiting 04/03/2015  . Hypoglycemia associated with diabetes ([place]) 04/03/2015  . Anemia of chronic disease 04/03/2015  . Secondary hyperparathyroidism ([place]) 04/03/2015  . Pressure ulcer 04/02/2015  . Acute respiratory failure with hypoxia ([place]) 04/01/2015  . Adjustment disorder with anxiety 03/14/2015  . Somatic symptom disorder, mild 03/08/2015  . Coronary artery disease involving native coronary artery of native heart without angina pectoris   . Nausea & vomiting 03/06/2015  . Abdominal pain 03/06/2015  . Type 2 diabetes mellitus with hypoglycemia without coma, without long-term current use of insulin ([place]) 03/06/2015  . HTN (hypertension) 03/06/2015  . Gastroparesis 02/24/2015  . Pleural effusion 02/19/2015  . Bacteremia due to Enterococcus 02/19/2015  . End-stage renal disease on hemodialysis ([place]) 02/19/2015  . Diarrhea 08/01/2014   PAST MEDICAL HISTORY:  Active Ambulatory Problems    Diagnosis Date Noted  . Pleural effusion 02/19/2015  . Bacteremia due to Enterococcus 02/19/2015  . End-stage renal disease on hemodialysis ([place]) 02/19/2015  . Gastroparesis 02/24/2015  . Nausea & vomiting 03/06/2015  . Abdominal pain 03/06/2015  . Type 2 diabetes mellitus with hypoglycemia without coma, without long-term current use of insulin ([place]) 03/06/2015  . HTN (hypertension) 03/06/2015  . Coronary artery disease involving native coronary artery of native heart without angina pectoris   . Somatic symptom disorder, mild 03/08/2015  . Adjustment disorder with anxiety 03/14/2015  . Acute respiratory failure with hypoxia ([place]) 04/01/2015  . Pressure ulcer 04/02/2015  . Acute pulmonary edema ([place]) 04/03/2015  . Nausea and vomiting 04/03/2015  . Hypoglycemia associated with diabetes ([place]) 04/03/2015  . Anemia of chronic disease 04/03/2015  . Secondary hyperparathyroidism ([place]) 04/03/2015  . Acute on chronic diastolic CHF (congestive heart failure) ([place])  04/05/2015  . Diabetic gastroparesis ([place]) 04/05/2015  . Hypokalemia 04/05/2015  . Generalized weakness 04/05/2015  . Hypoglycemia 04/24/2015  . Unresponsiveness 04/24/2015  . Sinus bradycardia 04/24/2015  . Hypothermia 04/24/2015  . Acute respiratory failure ([place]) 04/24/2015  . Pneumonia 05/21/2015  . Diarrhea, unspecified 07/22/2015  . Hyperkalemia 10/03/2015  . Nausea 11/12/2015  . High temperature 11/14/2015  . Encounter for central line placement   . Encounter for orogastric (OG) tube placement   . Acute renal failure ([place])   . Respiratory failure ([place])   . Renal mass   . Dyspnea   . Malnutrition of moderate degree 12/01/2015  . Major depressive disorder, recurrent episode, moderate ([place]) 03/15/2016  . Adjustment disorder with mixed anxiety and depressed mood 03/15/2016  . Generalized abdominal pain   . Uncontrollable vomiting   . Blood in stool   . Intractable cyclical vomiting with nausea   . Reflux esophagitis   . Gastritis   . Altered mental status 04/08/2016  . Hyperammonemia ([place]) 04/08/2016  . Elevated troponin 04/08/2016  . Depression 04/08/2016  .  Depression, major, recurrent, severe with psychosis ([place]) 04/08/2016  . TIA (transient ischemic attack) 04/21/2016  . Cellulitis of lower extremity 07/29/2016  . Chronic venous insufficiency 07/29/2016  . Lymphedema 07/29/2016  . Shortness of breath 05/04/2017  . Carotid stenosis 06/18/2017  . Osteomyelitis ([place]) 09/30/2017  . Complication of vascular access for dialysis 12/04/2017  . Gastroparesis due to DM ([place]) 01/18/2018  . Symptomatic anemia 06/18/2018  . Syncope 10/04/2018  . COPD (chronic obstructive pulmonary disease) ([place]) 10/06/2018  . Right-sided headache   . Atherosclerosis of native arteries of extremity with intermittent claudication ([place]) 05/01/2019  . CAP (community acquired pneumonia) 10/01/2019  . Leg pain 10/02/2019  . Hemorrhagic shock ([place]) 11/11/2019  . ESRD on hemodialysis ([place])   .  Complication of arteriovenous dialysis fistula 11/13/2019  . PVD (peripheral vascular disease) ([place]) 11/13/2019  . Severe anemia 11/13/2019  . Pressure injury of skin 11/13/2019  . Shortness of breath   . Respiratory failure ([place])   . Hematoma of arm, left, subsequent encounter 11/24/2019  . ESRD (end stage renal disease) ([place]) 04/13/2020  . Cellulitis of right leg 05/02/2020  . Benign essential HTN 05/19/2017  . Bilateral carotid artery stenosis 06/16/2017  . CKD (chronic kidney disease) stage 5, GFR less than 15 ml/min (HCC) 05/19/2017  . Hyperlipidemia, mixed 05/19/2017  . Rash, skin 12/16/2016  . Arm pain 05/21/2020  . Anemia in ESRD (end-stage renal disease) ([place]) 05/31/2020  . Diarrhea 08/01/2014  . Type 1 diabetes mellitus ([place]) 05/31/2020  . SOBOE (shortness of breath on exertion) 02/08/2020  . End-stage renal disease ([place]) 05/31/2020  . Hypertensive disorder 05/31/2020  . Obesity (BMI 30-39.9) 06/06/2020  . Fluid overload 08/10/2020  . HLD (hyperlipidemia) 08/10/2020  . Chest pain 08/10/2020  . CAD (coronary artery disease) 08/10/2020  . GERD (gastroesophageal reflux disease) 08/10/2020  . Discitis of thoracic region   . Advanced care planning/counseling discussion   . Bradycardia 09/17/2020   Resolved Ambulatory Problems    Diagnosis Date Noted  . No Resolved Ambulatory Problems   Past Medical History:  Diagnosis Date  . Anemia   . Anginal pain ([place])   . Anxiety   . Arthritis   . Asthma   . Broken wrist   . Bronchitis   . chronic diastolic CHF 03/31/6268  . Chronic kidney disease   . Coronary artery disease   . Diabetes mellitus ([place])   . Diabetes mellitus without complication ([place])   . Diabetic neuropathy ([place])   . dialysis 2006  . Diverticulosis   . Dizziness   . Elevated lipids   . Environmental and seasonal allergies   . ESRD (end stage renal disease) on dialysis ([place])   . Headache   . History of anemia due to chronic kidney disease   . History  of hiatal hernia   . HOH (hard of hearing)   . Hx of pancreatitis 2015  . Hypertension   . Lower extremity edema   . Mitral regurgitation   . Myocardial infarction ([place])   . Orthopnea   . Parathyroid abnormality ([place])   . Peripheral arterial disease ([place])   . Renal cancer ([place])   . Renal insufficiency   . Wheezing    SOCIAL HX:  Social History   Tobacco Use  . Smoking status: Never Smoker  . Smokeless tobacco: Never Used  Substance Use Topics  . Alcohol use: Not Currently    Comment: glass wine week per pt   FAMILY HX:  Family History  Problem  Relation Age of Onset  . Kidney disease Mother   . Diabetes Mother   . Cancer Father   . Kidney disease Sister       ALLERGIES:  Allergies  Allergen Reactions  . Ace Inhibitors Swelling and Anaphylaxis  . Ativan [Lorazepam] Other (See Comments)    Reaction:Hallucinations and headaches  . Compazine [Prochlorperazine Edisylate] Anaphylaxis, Nausea And Vomiting and Other (See Comments)    Other reaction(s): dystonia from this vs. Reglan, 23 Jul - patient relates that she takes promethazine frequently with no problems  . Sumatriptan Succinate Other (See Comments)    Other reaction(s): delirium and hallucinations per [facility] records  . Zofran [Ondansetron] Nausea And Vomiting    Per pt. she is allergic to zofran or will experience adverse reaction like hallucination   . Losartan Nausea Only  . Prochlorperazine Other (See Comments)    Reaction:  Unknown . Patient does not remember reaction but she does have vertigo and anxiety along with n and v at times. Could be used to treat any of these   . Reglan [Metoclopramide] Other (See Comments)    Per patient her Dr. Evelina Bucy her off it   . Scopolamine Other (See Comments)    Dizziness, also has vertigo already  . Tape Rash    Plastic tape causes rash  . Tapentadol Rash  . Ultrasound Gel Itching    Patient states the Ultrasound gel caused itching while in skin contact and resolved when  wiped away.     PERTINENT MEDICATIONS:  Outpatient Encounter Medications as of 11/14/2020  Medication Sig  . albuterol (PROVENTIL) (2.5 MG/3ML) 0.083% nebulizer solution Take 2.5 mg by nebulization every 4 (four) hours as needed for wheezing or shortness of breath.  Marland Kitchen albuterol (VENTOLIN HFA) 108 (90 Base) MCG/ACT inhaler Inhale 2 puffs into the lungs every 4 (four) hours as needed for wheezing or shortness of breath.  Marland Kitchen alum & mag hydroxide-simeth (MAALOX/MYLANTA) 200-200-20 MG/5ML suspension Take 15 mLs by mouth every 6 (six) hours as needed for indigestion or heartburn.  Marland Kitchen amLODipine (NORVASC) 10 MG tablet Take 1 tablet (10 mg total) by mouth daily.  Marland Kitchen ammonium lactate (LAC-HYDRIN) 12 % lotion Apply 1 application topically 2 (two) times daily as needed for dry skin.   . Ascorbic Acid (VITA-C PO) Take 1 tablet by mouth daily.  Marland Kitchen aspirin EC 81 MG tablet Take 81 mg by mouth daily.  Marland Kitchen atorvastatin (LIPITOR) 20 MG tablet Take 20 mg by mouth every evening.   . calcium acetate (PHOSLO) 667 MG capsule Take by mouth.  . calcium carbonate (TUMS - DOSED IN MG ELEMENTAL CALCIUM) 500 MG chewable tablet Chew 2.5 tablets (500 mg of elemental calcium total) by mouth every 6 (six) hours as needed for indigestion.  . cefTAZidime (FORTAZ) IVPB Inject 1 g into the vein every Monday, Wednesday, and Friday with hemodialysis. Indication: Thoracic vertebral osteomyelitis with disciits First Dose: Yes Last Day of Therapy:  10/10/20 Labs - Once weekly:  CBC/D, CMP, ESR, vancomycin trough   Method of administration may be changed at the discretion of home infusion pharmacist based upon assessment of the patient and/or caregiver's ability to self-administer the medication ordered. (Patient taking differently: Inject 1 g into the vein Every Tuesday,Thursday,and Saturday with dialysis. Indication: Thoracic vertebral osteomyelitis with disciits First Dose: Yes Last Day of Therapy:  10/10/20 Labs - Once weekly:  CBC/D, CMP,  ESR, vancomycin trough   Method of administration may be changed at the discretion of home infusion pharmacist  based upon assessment of the patient and/or caregiver's ability to self-administer the medication ordered.)  . Cholecalciferol (VITAMIN D3) 25 MCG (1000 UT) CAPS Take 1,000 Units by mouth daily.   . cholestyramine (QUESTRAN) 4 g packet Take 4 g by mouth 2 (two) times daily.  . cholestyramine light (PREVALITE) 4 g packet Take 4 g by mouth 2 (two) times daily.  Marland Kitchen CREON 12000 units CPEP capsule Take 36,000 Units by mouth 3 (three) times daily before meals.   . cyanocobalamin 1000 MCG tablet Take 1,000 mcg by mouth daily.   . diphenhydrAMINE (BENADRYL) 25 MG tablet Take 25 mg by mouth every 6 (six) hours as needed for itching.  Marland Kitchen epoetin alfa (EPOGEN) 10000 UNIT/ML injection Inject 1 mL (10,000 Units total) into the vein every Monday, Wednesday, and Friday with hemodialysis.  . feeding supplement, ENSURE ENLIVE, (ENSURE ENLIVE) LIQD Take 237 mLs by mouth daily.  . Ferrous Sulfate (IRON) 325 (65 Fe) MG TABS Take 325 mg by mouth daily.   . furosemide (LASIX) 80 MG tablet Take 80 mg by mouth daily.  . furosemide (LASIX) 80 MG tablet Take 1 tablet by mouth daily.  Marland Kitchen gabapentin (NEURONTIN) 100 MG capsule Take 100 mg by mouth every evening.   . hydrALAZINE (APRESOLINE) 50 MG tablet Take 1 tablet (50 mg total) by mouth every 8 (eight) hours.  Marland Kitchen HYDROcodone-acetaminophen (NORCO/VICODIN) 5-325 MG tablet Take 1 tablet by mouth 3 (three) times daily as needed for moderate pain or severe pain.  . hydrOXYzine (ATARAX/VISTARIL) 25 MG tablet Take 25 mg by mouth daily.   . meclizine (ANTIVERT) 25 MG tablet Take 25 mg by mouth 4 (four) times daily as needed for dizziness.  . megestrol (MEGACE) 400 MG/10ML suspension Take 10 mLs (400 mg total) by mouth daily.  . multivitamin (RENA-VIT) TABS tablet Take 1 tablet by mouth daily.   . mupirocin ointment (BACTROBAN) 2 % Apply 1 application topically daily as  needed (leg rash).   . nitroGLYCERIN (NITROSTAT) 0.4 MG SL tablet Place 0.4 mg under the tongue every 5 (five) minutes as needed for chest pain.   . Omega-3 300 MG CAPS Take 300 mg by mouth 2 (two) times daily. Every other day opposite omega XL  . ondansetron (ZOFRAN) 4 MG tablet Take 1 tablet (4 mg total) by mouth every 8 (eight) hours as needed for nausea or vomiting.  . pantoprazole (PROTONIX) 40 MG tablet Take 40 mg by mouth daily.  . polyethylene glycol (MIRALAX / GLYCOLAX) 17 g packet Take 17 g by mouth daily.  . Probiotic Product (PROBIOTIC PO) Take 1 capsule by mouth daily.  . promethazine (PHENERGAN) 25 MG tablet Take 1 tablet (25 mg total) by mouth every 6 (six) hours as needed for nausea or vomiting.  . sevelamer carbonate (RENVELA) 800 MG tablet Take 800 mg by mouth 4 (four) times daily. (at meals and with a snack)  . vancomycin IVPB Inject 1,000 mg into the vein every Monday, Wednesday, and Friday with hemodialysis. Indication:  Thoracic vertebral osteomyelitis with disciits First Dose: Yes Last Day of Therapy:  10/10/20 Labs - Weekly:  CBC/D, CMP, vancomycin trough, ESR Method of administration:Elastomeric Method of administration may be changed at the discretion of the patient and/or caregiver's ability to self-administer the medication ordered. (Patient taking differently: Inject 1,000 mg into the vein Every Tuesday,Thursday,and Saturday with dialysis. Indication:  Thoracic vertebral osteomyelitis with disciits First Dose: Yes Last Day of Therapy:  10/10/20 Labs - Weekly:  CBC/D, CMP, vancomycin trough, ESR  Method of administration:Elastomeric Method of administration may be changed at the discretion of the patient and/or caregiver's ability to self-administer the medication ordered.)   No facility-administered encounter medications on file as of 11/14/2020.    Objective: ROS  General: NAD ENMT: denies dysphagia Cardiovascular: denies chest pain Pulmonary: denies  cough,  denies increased SOB Abdomen: endorses  A good petite, denies  constipation, endorses continence of bowel GU: denies dysuria, endorses continence of urine MSK:  endorses ROM limitations, no falls reported Skin: denies rashes or wounds Neurological: endorses weakness,drowsiness;  denies pain, endorses insomnia Psych: Endorses positive mood Heme/lymph/immuno: denies bruises, abnormal bleeding  Physical Exam: Current and past weights:stable Constitutional: NAD General: frail appearing, ascites EYES: anicteric sclera, lids intact, no discharge  ENMT: intact hearing,oral mucous membranes moist, dentition intact CV:  3+ LE edema Pulmonary:  no increased work of breathing, no cough, no audible wheezes, room air Abdomen: intake 75%, no ascites GU: deferred MSK: moderate sarcopenia, decreased ROM in all extremities, no contractures of LE,  Ambulatory with cane Skin: warm and dry, no rashes or wounds on visible skin Neuro: Generalized weakness, no cognitive impairment Psych: non-anxious affect, A and O x 3 Hem/lymph/immuno: no widespread bruising   Thank you for the opportunity to participate in the care of Ms. Marone.  The palliative care team will continue to follow. Please call our office at [phone] if we can be of additional assistance.  Jason Coop, NP , DNP, MPH, AGPCNP-BC, ACHPN   COVID-19 PATIENT SCREENING TOOL  Person answering questions: _______self____________   1.  Is the patient or any family member in the home showing any signs or symptoms regarding respiratory infection?                  Person with Symptom  ______________na___________ a. Fever/chills/headache                                                        Yes___ No__X_            b. Shortness of breath                                                            Yes___ No__X_           c. Cough/congestion                                               Yes___  No__X_          d. Muscle/Body aches/pains                                                    Yes___ No__X_         e. Gastrointestinal symptoms (diarrhea,nausea)             Yes___ No__X_  f. Sudden loss of smell or taste      Yes___ No__X_        2. Within the past 10 days, has anyone living in the home had any contact with someone with or under investigation for COVID-19?    Yes___ No__X__   Person __________________

## 2020-11-16 DIAGNOSIS — I5030 Unspecified diastolic (congestive) heart failure: Secondary | ICD-10-CM | POA: Diagnosis not present

## 2020-11-16 DIAGNOSIS — M4624 Osteomyelitis of vertebra, thoracic region: Secondary | ICD-10-CM | POA: Diagnosis not present

## 2020-11-16 DIAGNOSIS — J449 Chronic obstructive pulmonary disease, unspecified: Secondary | ICD-10-CM | POA: Diagnosis not present

## 2020-11-16 DIAGNOSIS — I2510 Atherosclerotic heart disease of native coronary artery without angina pectoris: Secondary | ICD-10-CM | POA: Diagnosis not present

## 2020-11-16 DIAGNOSIS — D631 Anemia in chronic kidney disease: Secondary | ICD-10-CM | POA: Diagnosis not present

## 2020-11-16 DIAGNOSIS — I132 Hypertensive heart and chronic kidney disease with heart failure and with stage 5 chronic kidney disease, or end stage renal disease: Secondary | ICD-10-CM | POA: Diagnosis not present

## 2020-11-16 DIAGNOSIS — F32A Depression, unspecified: Secondary | ICD-10-CM | POA: Diagnosis not present

## 2020-11-16 DIAGNOSIS — N186 End stage renal disease: Secondary | ICD-10-CM | POA: Diagnosis not present

## 2020-11-16 DIAGNOSIS — I823 Embolism and thrombosis of renal vein: Secondary | ICD-10-CM | POA: Diagnosis not present

## 2020-11-21 ENCOUNTER — Emergency Department: Payer: Medicare HMO

## 2020-11-21 ENCOUNTER — Other Ambulatory Visit: Payer: Self-pay

## 2020-11-21 ENCOUNTER — Emergency Department
Admission: EM | Admit: 2020-11-21 | Discharge: 2020-11-21 | Disposition: A | Discharge status: Home / Self Care | Payer: Medicare HMO | Attending: Emergency Medicine | Admitting: Emergency Medicine

## 2020-11-21 DIAGNOSIS — I5032 Chronic diastolic (congestive) heart failure: Secondary | ICD-10-CM | POA: Diagnosis not present

## 2020-11-21 DIAGNOSIS — J449 Chronic obstructive pulmonary disease, unspecified: Secondary | ICD-10-CM | POA: Diagnosis not present

## 2020-11-21 DIAGNOSIS — E1140 Type 2 diabetes mellitus with diabetic neuropathy, unspecified: Secondary | ICD-10-CM | POA: Diagnosis not present

## 2020-11-21 DIAGNOSIS — Z79899 Other long term (current) drug therapy: Secondary | ICD-10-CM | POA: Insufficient documentation

## 2020-11-21 DIAGNOSIS — R0602 Shortness of breath: Secondary | ICD-10-CM

## 2020-11-21 DIAGNOSIS — R140 Abdominal distension (gaseous): Secondary | ICD-10-CM | POA: Insufficient documentation

## 2020-11-21 DIAGNOSIS — G893 Neoplasm related pain (acute) (chronic): Secondary | ICD-10-CM | POA: Insufficient documentation

## 2020-11-21 DIAGNOSIS — M5441 Lumbago with sciatica, right side: Secondary | ICD-10-CM | POA: Diagnosis not present

## 2020-11-21 DIAGNOSIS — R531 Weakness: Secondary | ICD-10-CM | POA: Diagnosis not present

## 2020-11-21 DIAGNOSIS — Z992 Dependence on renal dialysis: Secondary | ICD-10-CM | POA: Insufficient documentation

## 2020-11-21 DIAGNOSIS — Z85528 Personal history of other malignant neoplasm of kidney: Secondary | ICD-10-CM | POA: Insufficient documentation

## 2020-11-21 DIAGNOSIS — J45909 Unspecified asthma, uncomplicated: Secondary | ICD-10-CM | POA: Diagnosis not present

## 2020-11-21 DIAGNOSIS — R1084 Generalized abdominal pain: Secondary | ICD-10-CM | POA: Diagnosis not present

## 2020-11-21 DIAGNOSIS — N186 End stage renal disease: Secondary | ICD-10-CM | POA: Diagnosis not present

## 2020-11-21 DIAGNOSIS — I2510 Atherosclerotic heart disease of native coronary artery without angina pectoris: Secondary | ICD-10-CM | POA: Diagnosis not present

## 2020-11-21 DIAGNOSIS — I132 Hypertensive heart and chronic kidney disease with heart failure and with stage 5 chronic kidney disease, or end stage renal disease: Secondary | ICD-10-CM | POA: Insufficient documentation

## 2020-11-21 DIAGNOSIS — E1122 Type 2 diabetes mellitus with diabetic chronic kidney disease: Secondary | ICD-10-CM | POA: Diagnosis not present

## 2020-11-21 DIAGNOSIS — Z7982 Long term (current) use of aspirin: Secondary | ICD-10-CM | POA: Insufficient documentation

## 2020-11-21 DIAGNOSIS — G8929 Other chronic pain: Secondary | ICD-10-CM

## 2020-11-21 LAB — CBC
HCT: 26.5 % — ABNORMAL LOW (ref 36.0–46.0)
Hemoglobin: 8.7 g/dL — ABNORMAL LOW (ref 12.0–15.0)
MCH: 27.3 pg (ref 26.0–34.0)
MCHC: 32.8 g/dL (ref 30.0–36.0)
MCV: 83.1 fL (ref 80.0–100.0)
Platelets: 108 10*3/uL — ABNORMAL LOW (ref 150–400)
RBC: 3.19 MIL/uL — ABNORMAL LOW (ref 3.87–5.11)
RDW: 18.9 % — ABNORMAL HIGH (ref 11.5–15.5)
WBC: 3.8 10*3/uL — ABNORMAL LOW (ref 4.0–10.5)
nRBC: 0 % (ref 0.0–0.2)

## 2020-11-21 LAB — BASIC METABOLIC PANEL
Anion gap: 14 (ref 5–15)
BUN: 16 mg/dL (ref 8–23)
CO2: 31 mmol/L (ref 22–32)
Calcium: 8.8 mg/dL — ABNORMAL LOW (ref 8.9–10.3)
Chloride: 93 mmol/L — ABNORMAL LOW (ref 98–111)
Creatinine, Ser: 4.14 mg/dL — ABNORMAL HIGH (ref 0.44–1.00)
GFR, Estimated: 12 mL/min — ABNORMAL LOW (ref >60)
Glucose, Bld: 83 mg/dL (ref 70–99)
Potassium: 3.3 mmol/L — ABNORMAL LOW (ref 3.5–5.1)
Sodium: 138 mmol/L (ref 135–145)

## 2020-11-21 MED ORDER — HYDROCODONE-ACETAMINOPHEN 5-325 MG PO TABS
2.0000 | ORAL_TABLET | Freq: Once | ORAL | Status: AC
  Administered 2020-11-21: 2 via ORAL
  Filled 2020-11-21: qty 2

## 2020-11-21 NOTE — ED Triage Notes (Signed)
First RN Note: pt to ED via [facility] from home, with c/o bilateral leg pain and swelling. Per EMS pt also c/o abdominal distention. Per EMS pt is a T/TH/S dialysis patient, had dialysis yesterday and full treatment yesterday. Per EMS pt is A&O and able to stand and pivot. Per EMS pt states problems have been ongoing since new years.   Per EMS pt has been verbally abusive since EMS arrived and transported patient.   183/75 CBG 116 11RR  98% RA 65HR 97.7 Oral

## 2020-11-21 NOTE — ED Notes (Signed)
Son outside to pick pt up

## 2020-11-21 NOTE — ED Triage Notes (Signed)
Pt comes via EMS from home with increased weakness for about a month. Pt stats he PCP have been scheduling appts to try and figure out what is wrong.  Pt states distended abdomen. Pt states dialysis T, TH, Sat. Pt states last treatment was yesterday.  Pt states  SOB. Pt states this is all the time and they wont give her O2.

## 2020-11-21 NOTE — ED Provider Notes (Signed)
[facility] Emergency Department Provider Note   ____________________________________________   Event Date/Time   First MD Initiated Contact with Patient 11/21/20 1250     (approximate)  I have reviewed the triage vital signs and the nursing notes.   HISTORY  Chief Complaint Weakness    HPI [name] is a 60 y.o. female with a stated past medical history of CHF, type 2 diabetes, and end-stage renal disease on dialysis Tuesday/Thursday/Saturday who presents for increased weakness over the last month.  Patient also complaining of new abdominal distention that began approximately 1 week ago.  Upon further discussion of patient's generalized weakness, she states that it has been going on and worsening over the past 14 years.  She is however more concerned about the abdominal distention that has increased recently.  Patient states that at her dialysis session yesterday, she was told that she has "8 kg of fluid" but they could only get 3 within a 4-hour session.  Patient is concerned that she still has fluid on her abdomen and is concerned that she may need dialysis before her session tomorrow.  Patient endorses generalized pain that is worse over her lower back and is a chronic pain for her.  Patient currently denies any vision changes, tinnitus, difficulty speaking, facial droop, sore throat, vomiting/diarrhea, dysuria, or numbness/paresthesias in any extremity         Past Medical History:  Diagnosis Date  . Anemia   . Anginal pain ([place])   . Anxiety   . Arthritis   . Asthma   . Broken wrist   . Bronchitis   . chronic diastolic CHF 1/61/0960  . Chronic kidney disease   . COPD (chronic obstructive pulmonary disease) ([place])   . Coronary artery disease    a. cath 2013: stenting to RCA (report not available); b. cath 2014: LM nl, pLAD 40%, mLAD nl, ost LCx 40%, mid LCx nl, pRCA 30% @ site of prior stent, mRCA 50%  . Depression   . Diabetes mellitus ([place])    . Diabetes mellitus without complication ([place])   . Diabetic neuropathy ([place])   . dialysis 2006  . Diverticulosis   . Dizziness   . Dyspnea   . Elevated lipids   . Environmental and seasonal allergies   . ESRD (end stage renal disease) on dialysis ([place])    M-W-F  . Gastroparesis   . GERD (gastroesophageal reflux disease)   . Headache   . History of anemia due to chronic kidney disease   . History of hiatal hernia   . HOH (hard of hearing)   . Hx of pancreatitis 2015  . Hypertension   . Lower extremity edema   . Mitral regurgitation    a. echo 10/2013: EF 62%, noWMA, mildly dilated LA, mild to mod MR/TR, GR1DD  . Myocardial infarction ([place])   . Orthopnea   . Parathyroid abnormality ([place])   . Peripheral arterial disease ([place])   . Pneumonia   . Renal cancer ([place])   . Renal insufficiency    Pt is on dialysis on M,W + F.  . Wheezing     Patient Active Problem List   Diagnosis Date Noted  . Bradycardia 09/17/2020  . Advanced care planning/counseling discussion   . Discitis of thoracic region   . Fluid overload 08/10/2020  . HLD (hyperlipidemia) 08/10/2020  . Chest pain 08/10/2020  . CAD (coronary artery disease) 08/10/2020  . GERD (gastroesophageal reflux disease) 08/10/2020  . Obesity (BMI 30-39.9)  06/06/2020  . Anemia in ESRD (end-stage renal disease) ([place]) 05/31/2020  . Type 1 diabetes mellitus ([place]) 05/31/2020  . End-stage renal disease ([place]) 05/31/2020  . Hypertensive disorder 05/31/2020  . Arm pain 05/21/2020  . Cellulitis of right leg 05/02/2020  . ESRD (end stage renal disease) ([place]) 04/13/2020  . SOBOE (shortness of breath on exertion) 02/08/2020  . Hematoma of arm, left, subsequent encounter 11/24/2019  . Respiratory failure ([place])   . Shortness of breath   . Complication of arteriovenous dialysis fistula 11/13/2019  . PVD (peripheral vascular disease) ([place]) 11/13/2019  . Severe anemia 11/13/2019  . Pressure injury of skin 11/13/2019  . ESRD on  hemodialysis ([place])   . Hemorrhagic shock ([place]) 11/11/2019  . Leg pain 10/02/2019  . CAP (community acquired pneumonia) 10/01/2019  . Atherosclerosis of native arteries of extremity with intermittent claudication ([place]) 05/01/2019  . Right-sided headache   . COPD (chronic obstructive pulmonary disease) ([place]) 10/06/2018  . Syncope 10/04/2018  . Symptomatic anemia 06/18/2018  . Gastroparesis due to DM ([place]) 01/18/2018  . Complication of vascular access for dialysis 12/04/2017  . Osteomyelitis ([place]) 09/30/2017  . Carotid stenosis 06/18/2017  . Bilateral carotid artery stenosis 06/16/2017  . Benign essential HTN 05/19/2017  . CKD (chronic kidney disease) stage 5, GFR less than 15 ml/min (HCC) 05/19/2017  . Hyperlipidemia, mixed 05/19/2017  . Shortness of breath 05/04/2017  . Rash, skin 12/16/2016  . Cellulitis of lower extremity 07/29/2016  . Chronic venous insufficiency 07/29/2016  . Lymphedema 07/29/2016  . TIA (transient ischemic attack) 04/21/2016  . Altered mental status 04/08/2016  . Hyperammonemia ([place]) 04/08/2016  . Elevated troponin 04/08/2016  . Depression 04/08/2016  . Depression, major, recurrent, severe with psychosis ([place]) 04/08/2016  . Blood in stool   . Intractable cyclical vomiting with nausea   . Reflux esophagitis   . Gastritis   . Generalized abdominal pain   . Uncontrollable vomiting   . Major depressive disorder, recurrent episode, moderate ([place]) 03/15/2016  . Adjustment disorder with mixed anxiety and depressed mood 03/15/2016  . Malnutrition of moderate degree 12/01/2015  . Renal mass   . Dyspnea   . Acute renal failure ([place])   . Respiratory failure ([place])   . High temperature 11/14/2015  . Encounter for central line placement   . Encounter for orogastric (OG) tube placement   . Nausea 11/12/2015  . Hyperkalemia 10/03/2015  . Diarrhea, unspecified 07/22/2015  . Pneumonia 05/21/2015  . Hypoglycemia 04/24/2015  . Unresponsiveness 04/24/2015  . Sinus  bradycardia 04/24/2015  . Hypothermia 04/24/2015  . Acute respiratory failure ([place]) 04/24/2015  . Acute on chronic diastolic CHF (congestive heart failure) ([place]) 04/05/2015  . Diabetic gastroparesis ([place]) 04/05/2015  . Hypokalemia 04/05/2015  . Generalized weakness 04/05/2015  . Acute pulmonary edema ([place]) 04/03/2015  . Nausea and vomiting 04/03/2015  . Hypoglycemia associated with diabetes ([place]) 04/03/2015  . Anemia of chronic disease 04/03/2015  . Secondary hyperparathyroidism ([place]) 04/03/2015  . Pressure ulcer 04/02/2015  . Acute respiratory failure with hypoxia ([place]) 04/01/2015  . Adjustment disorder with anxiety 03/14/2015  . Somatic symptom disorder, mild 03/08/2015  . Coronary artery disease involving native coronary artery of native heart without angina pectoris   . Nausea & vomiting 03/06/2015  . Abdominal pain 03/06/2015  . Type 2 diabetes mellitus with hypoglycemia without coma, without long-term current use of insulin ([place]) 03/06/2015  . HTN (hypertension) 03/06/2015  . Gastroparesis 02/24/2015  . Pleural effusion 02/19/2015  . Bacteremia due to Enterococcus 02/19/2015  .  End-stage renal disease on hemodialysis ([place]) 02/19/2015  . Diarrhea 08/01/2014    Past Surgical History:  Procedure Laterality Date  . A/V SHUNTOGRAM Left 01/20/2018   Procedure: A/V SHUNTOGRAM;  Surgeon: Algernon Huxley, MD;  Location: [place] [facility];  Service: Cardiovascular;  Laterality: Left;  . ABDOMINAL HYSTERECTOMY  1992  . AMPUTATION TOE Left 10/02/2017   Procedure: AMPUTATION TOE-LEFT GREAT TOE;  Surgeon: Albertine Patricia, DPM;  Location: [facility];  Service: Podiatry;  Laterality: Left;  . APPENDECTOMY    . APPLICATION OF WOUND VAC N/A 11/25/2019   Procedure: APPLICATION OF WOUND VAC;  Surgeon: Katha Cabal, MD;  Location: [facility];  Service: Vascular;  Laterality: N/A;  . ARTERY BIOPSY Right 10/11/2018   Procedure: BIOPSY TEMPORAL ARTERY;  Surgeon: Vickie Epley, MD;  Location:  [facility];  Service: General;  Laterality: Right;  . CARDIAC CATHETERIZATION Left 07/26/2015   Procedure: Left Heart Cath and Coronary Angiography;  Surgeon: Dionisio David, MD;  Location: [place] [facility];  Service: Cardiovascular;  Laterality: Left;  . CATARACT EXTRACTION W/ INTRAOCULAR LENS IMPLANT Right   . CATARACT EXTRACTION W/PHACO Left 03/10/2017   Procedure: CATARACT EXTRACTION PHACO AND INTRAOCULAR LENS PLACEMENT (IOC);  Surgeon: Birder Robson, MD;  Location: [facility];  Service: Ophthalmology;  Laterality: Left;  [place] 00:51.9 AP% 14.2 CDE 7.39 fluid pack lot # [id number]  . CENTRAL LINE INSERTION Right 11/11/2019   Procedure: CENTRAL LINE INSERTION;  Surgeon: Katha Cabal, MD;  Location: [facility];  Service: Vascular;  Laterality: Right;  . CENTRAL LINE INSERTION  11/25/2019   Procedure: CENTRAL LINE INSERTION;  Surgeon: Katha Cabal, MD;  Location: [facility];  Service: Vascular;;  . CHOLECYSTECTOMY    . COLONOSCOPY WITH PROPOFOL N/A 08/12/2016   Procedure: COLONOSCOPY WITH PROPOFOL;  Surgeon: Lollie Sails, MD;  Location: [facility] ENDOSCOPY;  Service: Endoscopy;  Laterality: N/A;  . DIALYSIS FISTULA CREATION Left    upper arm  . dialysis grafts    . DIALYSIS/PERMA CATHETER INSERTION N/A 11/14/2019   Procedure: DIALYSIS/PERMA CATHETER INSERTION;  Surgeon: Algernon Huxley, MD;  Location: [place] [facility];  Service: Cardiovascular;  Laterality: N/A;  . DIALYSIS/PERMA CATHETER INSERTION N/A 02/03/2020   Procedure: DIALYSIS/PERMA CATHETER INSERTION;  Surgeon: Katha Cabal, MD;  Location: [place] [facility];  Service: Cardiovascular;  Laterality: N/A;  . DIALYSIS/PERMA CATHETER INSERTION N/A 06/19/2020   Procedure: DIALYSIS/PERMA CATHETER INSERTION;  Surgeon: Katha Cabal, MD;  Location: [place] [facility];  Service: Cardiovascular;  Laterality: N/A;  . DIALYSIS/PERMA CATHETER REMOVAL N/A 05/25/2020   Procedure: DIALYSIS/PERMA CATHETER REMOVAL;  Surgeon: Katha Cabal, MD;  Location: [place] [facility];  Service: Cardiovascular;  Laterality: N/A;  . ESOPHAGOGASTRODUODENOSCOPY N/A 03/08/2015   Procedure: ESOPHAGOGASTRODUODENOSCOPY (EGD);  Surgeon: Manya Silvas, MD;  Location: [facility] ENDOSCOPY;  Service: Endoscopy;  Laterality: N/A;  . ESOPHAGOGASTRODUODENOSCOPY (EGD) WITH PROPOFOL N/A 03/18/2016   Procedure: ESOPHAGOGASTRODUODENOSCOPY (EGD) WITH PROPOFOL;  Surgeon: [name], MD;  Location: [facility] ENDOSCOPY;  Service: Endoscopy;  Laterality: N/A;  . EYE SURGERY Right 2018  . FECAL TRANSPLANT N/A 08/23/2015   Procedure: FECAL TRANSPLANT;  Surgeon: Manya Silvas, MD;  Location: [facility] ENDOSCOPY;  Service: Endoscopy;  Laterality: N/A;  . HAND SURGERY Bilateral   . HEMATOMA EVACUATION Left 11/25/2019   Procedure: EVACUATION HEMATOMA;  Surgeon: Katha Cabal, MD;  Location: [facility];  Service: Vascular;  Laterality: Left;  . I & D EXTREMITY Left 11/25/2019  Procedure: IRRIGATION AND DEBRIDEMENT EXTREMITY;  Surgeon: Katha Cabal, MD;  Location: [facility];  Service: Vascular;  Laterality: Left;  . IR FLUORO GUIDE CV LINE RIGHT  04/06/2020  . IR INJECT/THERA/INC NEEDLE/CATH/PLC EPI/CERV/THOR [facility]  08/13/2020  . IR RADIOLOGIST EVAL & MGMT  07/28/2019  . IR RADIOLOGIST EVAL & MGMT  08/11/2019  . LIGATION OF ARTERIOVENOUS  FISTULA Left 11/11/2019  . LIGATION OF ARTERIOVENOUS  FISTULA Left 11/11/2019   Procedure: LIGATION OF ARTERIOVENOUS  FISTULA;  Surgeon: Katha Cabal, MD;  Location: [facility];  Service: Vascular;  Laterality: Left;  . LIGATIONS OF HERO GRAFT Right 06/13/2020   Procedure: LIGATION / REMOVAL OF RIGHT HERO GRAFT;  Surgeon: Katha Cabal, MD;  Location: [facility];  Service: Vascular;  Laterality: Right;  . PERIPHERAL VASCULAR CATHETERIZATION N/A 12/20/2015   Procedure: Thrombectomy of dialysis access versus permcath placement;  Surgeon: Algernon Huxley, MD;  Location: [place] [facility];  Service: Cardiovascular;  Laterality: N/A;   . PERIPHERAL VASCULAR CATHETERIZATION N/A 12/20/2015   Procedure: A/V Shunt Intervention;  Surgeon: Algernon Huxley, MD;  Location: [place] [facility];  Service: Cardiovascular;  Laterality: N/A;  . PERIPHERAL VASCULAR CATHETERIZATION N/A 12/20/2015   Procedure: A/V Shuntogram/Fistulagram;  Surgeon: Algernon Huxley, MD;  Location: [place] [facility];  Service: Cardiovascular;  Laterality: N/A;  . PERIPHERAL VASCULAR CATHETERIZATION N/A 01/02/2016   Procedure: A/V Shuntogram/Fistulagram;  Surgeon: Algernon Huxley, MD;  Location: [place] [facility];  Service: Cardiovascular;  Laterality: N/A;  . PERIPHERAL VASCULAR CATHETERIZATION N/A 01/02/2016   Procedure: A/V Shunt Intervention;  Surgeon: Algernon Huxley, MD;  Location: [place] [facility];  Service: Cardiovascular;  Laterality: N/A;  . TEE WITHOUT CARDIOVERSION N/A 06/11/2020   Procedure: TRANSESOPHAGEAL ECHOCARDIOGRAM (TEE);  Surgeon: Teodoro Spray, MD;  Location: [facility];  Service: Cardiovascular;  Laterality: N/A;  . UPPER EXTREMITY VENOGRAPHY Right 01/18/2020   Procedure: UPPER EXTREMITY VENOGRAPHY;  Surgeon: Katha Cabal, MD;  Location: [place] [facility];  Service: Cardiovascular;  Laterality: Right;  Marland Kitchen VASCULAR ACCESS DEVICE INSERTION Right 04/13/2020   Procedure: INSERTION OF HERO VASCULAR ACCESS DEVICE (GRAFT);  Surgeon: Katha Cabal, MD;  Location: [facility];  Service: Vascular;  Laterality: Right;    Prior to Admission medications   Medication Sig Start Date End Date Taking? Authorizing Provider  albuterol (PROVENTIL) (2.5 MG/3ML) 0.083% nebulizer solution Take 2.5 mg by nebulization every 4 (four) hours as needed for wheezing or shortness of breath.    [provider]  albuterol (VENTOLIN HFA) 108 (90 Base) MCG/ACT inhaler Inhale 2 puffs into the lungs every 4 (four) hours as needed for wheezing or shortness of breath. 11/09/19   [provider]  alum & mag hydroxide-simeth (MAALOX/MYLANTA) 200-200-20 MG/5ML  suspension Take 15 mLs by mouth every 6 (six) hours as needed for indigestion or heartburn. 06/25/20   Enzo Bi, MD  amLODipine (NORVASC) 10 MG tablet Take 1 tablet (10 mg total) by mouth daily. 06/25/20   Enzo Bi, MD  ammonium lactate (LAC-HYDRIN) 12 % lotion Apply 1 application topically 2 (two) times daily as needed for dry skin.     [provider]  Ascorbic Acid (VITA-C PO) Take 1 tablet by mouth daily.    [provider]  aspirin EC 81 MG tablet Take 81 mg by mouth daily.    [provider]  atorvastatin (LIPITOR) 20 MG tablet Take 20 mg by mouth every evening.  10/24/19   [provider]  calcium acetate (PHOSLO) 667 MG capsule Take by mouth. 07/16/20   [provider]  calcium carbonate (TUMS - DOSED IN MG ELEMENTAL CALCIUM) 500 MG chewable tablet Chew 2.5 tablets (500 mg of elemental calcium total) by mouth every 6 (six) hours as needed for indigestion. 06/25/20   Enzo Bi, MD  cefTAZidime Tressie Ellis) IVPB Inject 1 g into the vein every Monday, Wednesday, and Friday with hemodialysis. Indication: Thoracic vertebral osteomyelitis with disciits First Dose: Yes Last Day of Therapy:  10/10/20 Labs - Once weekly:  CBC/D, CMP, ESR, vancomycin trough   Method of administration may be changed at the discretion of home infusion pharmacist based upon assessment of the patient and/or caregiver's ability to self-administer the medication ordered. Patient taking differently: Inject 1 g into the vein Every Tuesday,Thursday,and Saturday with dialysis. Indication: Thoracic vertebral osteomyelitis with disciits First Dose: Yes Last Day of Therapy:  10/10/20 Labs - Once weekly:  CBC/D, CMP, ESR, vancomycin trough   Method of administration may be changed at the discretion of home infusion pharmacist based upon assessment of the patient and/or caregiver's ability to self-administer the medication ordered. 08/17/20   Wyvonnia Dusky, MD  Cholecalciferol (VITAMIN  D3) 25 MCG (1000 UT) CAPS Take 1,000 Units by mouth daily.     [provider]  cholestyramine (QUESTRAN) 4 g packet Take 4 g by mouth 2 (two) times daily. 07/26/20   [provider]  cholestyramine light (PREVALITE) 4 g packet Take 4 g by mouth 2 (two) times daily.    [provider]  CREON 12000 units CPEP capsule Take 36,000 Units by mouth 3 (three) times daily before meals.  10/21/19   [provider]  cyanocobalamin 1000 MCG tablet Take 1,000 mcg by mouth daily.     [provider]  diphenhydrAMINE (BENADRYL) 25 MG tablet Take 25 mg by mouth every 6 (six) hours as needed for itching.    [provider]  epoetin alfa (EPOGEN) 10000 UNIT/ML injection Inject 1 mL (10,000 Units total) into the vein every Monday, Wednesday, and Friday with hemodialysis. 06/27/20   Enzo Bi, MD  feeding supplement, ENSURE ENLIVE, (ENSURE ENLIVE) LIQD Take 237 mLs by mouth daily. 06/26/20   Enzo Bi, MD  Ferrous Sulfate (IRON) 325 (65 Fe) MG TABS Take 325 mg by mouth daily.     [provider]  furosemide (LASIX) 80 MG tablet Take 80 mg by mouth daily. 05/08/20   [provider]  furosemide (LASIX) 80 MG tablet Take 1 tablet by mouth daily. 08/13/20   [provider]  gabapentin (NEURONTIN) 100 MG capsule Take 100 mg by mouth every evening.     [provider]  hydrALAZINE (APRESOLINE) 50 MG tablet Take 1 tablet (50 mg total) by mouth every 8 (eight) hours. 06/25/20   Enzo Bi, MD  HYDROcodone-acetaminophen (NORCO/VICODIN) 5-325 MG tablet Take 1 tablet by mouth 3 (three) times daily as needed for moderate pain or severe pain. 07/26/20   [provider]  hydrOXYzine (ATARAX/VISTARIL) 25 MG tablet Take 25 mg by mouth daily.     [provider]  meclizine (ANTIVERT) 25 MG tablet Take 25 mg by mouth 4 (four) times daily as needed for dizziness.    [provider]  megestrol (MEGACE) 400 MG/10ML suspension Take  10 mLs (400 mg total) by mouth daily. 09/21/20   Shelly Coss, MD  multivitamin (RENA-VIT) TABS tablet Take 1 tablet by mouth daily.     [provider]  mupirocin ointment (BACTROBAN) 2 % Apply 1 application topically daily as needed (leg rash).  11/09/19   [provider]  nitroGLYCERIN (NITROSTAT) 0.4 MG SL tablet Place 0.4 mg under the tongue every 5 (five) minutes as needed for chest pain.     [provider]  Omega-3 300 MG CAPS Take 300 mg by mouth 2 (two) times daily. Every other day opposite omega XL    [provider]  ondansetron (ZOFRAN) 4 MG tablet Take 1 tablet (4 mg total) by mouth every 8 (eight) hours as needed for nausea or vomiting. 09/21/20 09/21/21  Shelly Coss, MD  pantoprazole (PROTONIX) 40 MG tablet Take 40 mg by mouth daily. 08/28/19   [provider]  polyethylene glycol (MIRALAX / GLYCOLAX) 17 g packet Take 17 g by mouth daily. 06/25/20   Enzo Bi, MD  Probiotic Product (PROBIOTIC PO) Take 1 capsule by mouth daily.    [provider]  promethazine (PHENERGAN) 25 MG tablet Take 1 tablet (25 mg total) by mouth every 6 (six) hours as needed for nausea or vomiting. 06/25/20   Enzo Bi, MD  sevelamer carbonate (RENVELA) 800 MG tablet Take 800 mg by mouth 4 (four) times daily. (at meals and with a snack)    [provider]  vancomycin IVPB Inject 1,000 mg into the vein every Monday, Wednesday, and Friday with hemodialysis. Indication:  Thoracic vertebral osteomyelitis with disciits First Dose: Yes Last Day of Therapy:  10/10/20 Labs - Weekly:  CBC/D, CMP, vancomycin trough, ESR Method of administration:Elastomeric Method of administration may be changed at the discretion of the patient and/or caregiver's ability to self-administer the medication ordered. Patient taking differently: Inject 1,000 mg into the vein Every Tuesday,Thursday,and Saturday with dialysis. Indication:  Thoracic vertebral osteomyelitis with  disciits First Dose: Yes Last Day of Therapy:  10/10/20 Labs - Weekly:  CBC/D, CMP, vancomycin trough, ESR Method of administration:Elastomeric Method of administration may be changed at the discretion of the patient and/or caregiver's ability to self-administer the medication ordered. 08/17/20   Wyvonnia Dusky, MD    Allergies Ace inhibitors, Ativan [lorazepam], Compazine [prochlorperazine edisylate], Sumatriptan succinate, Zofran [ondansetron], Losartan, Prochlorperazine, Reglan [metoclopramide], Scopolamine, Tape, Tapentadol, and Ultrasound gel  Family History  Problem Relation Age of Onset  . Kidney disease Mother   . Diabetes Mother   . Cancer Father   . Kidney disease Sister     Social History Social History   Tobacco Use  . Smoking status: Never Smoker  . Smokeless tobacco: Never Used  Vaping Use  . Vaping Use: Never used  Substance Use Topics  . Alcohol use: Not Currently    Comment: glass wine week per pt  . Drug use: Yes    Types: Marijuana    Comment: once a day    Review of Systems Constitutional: No fever/chills Eyes: No visual changes. ENT: No sore throat. Cardiovascular: Endorses chest pain. Respiratory: Endorses shortness of breath. Gastrointestinal: Endorses abdominal pain and distention.  Endorses nausea, no vomiting.  No diarrhea. Genitourinary: Negative for dysuria. Musculoskeletal: Negative for acute arthralgias Skin: Negative for rash. Neurological: Endorses generalized weakness.  Negative for headaches, numbness/paresthesias in any extremity Psychiatric: Negative for suicidal ideation/homicidal ideation   ____________________________________________   PHYSICAL EXAM:  VITAL SIGNS: ED Triage Vitals  Enc Vitals Group     BP 11/21/20 1013 (!) 178/94     Pulse Rate 11/21/20 1013 63     Resp 11/21/20 1013 18     Temp 11/21/20 1013 97.7  F (36.5 C)     Temp src --      SpO2 11/21/20 1013 99 %     Weight 11/21/20 1012 170 lb (77.1 kg)      Height 11/21/20 1012 5' 5.5" (1.664 m)     Head Circumference --      Peak Flow --      Pain Score 11/21/20 1008 10     Pain Loc --      Pain Edu? --      Excl. in [place]? --    Constitutional: Alert and oriented. Well appearing and in no acute distress. Eyes: Conjunctivae are normal. PERRL. Head: Atraumatic. Nose: No congestion/rhinnorhea. Mouth/Throat: Mucous membranes are moist. Neck: No stridor Cardiovascular: Grossly normal heart sounds.  Good peripheral circulation.  2+ pitting edema over bilateral lower extremities Respiratory: Normal respiratory effort.  No retractions. Gastrointestinal: Abdominal distention with fluid wave and generalized tenderness to palpation Musculoskeletal: No obvious deformities Neurologic:  Normal speech and language. No gross focal neurologic deficits are appreciated. Skin:  Skin is warm and dry. No rash noted. Psychiatric: Mood and affect are normal. Speech and behavior are normal.  ____________________________________________   LABS (all labs ordered are listed, but only abnormal results are displayed)  Labs Reviewed  BASIC METABOLIC PANEL - Abnormal; Notable for the following components:      Result Value   Potassium 3.3 (*)    Chloride 93 (*)    Creatinine, Ser 4.14 (*)    Calcium 8.8 (*)    GFR, Estimated 12 (*)    All other components within normal limits  CBC - Abnormal; Notable for the following components:   WBC 3.8 (*)    RBC 3.19 (*)    Hemoglobin 8.7 (*)    HCT 26.5 (*)    RDW 18.9 (*)    Platelets 108 (*)    All other components within normal limits  URINALYSIS, COMPLETE (UACMP) WITH MICROSCOPIC  CBG MONITORING, ED   ____________________________________________  EKG  ED ECG REPORT I, Naaman Plummer, the attending physician, personally viewed and interpreted this ECG.  Date: 11/21/2020 EKG Time: 1004 Rate: 64 Rhythm: normal sinus rhythm QRS Axis: normal Intervals: Prolonged QT ST/T Wave abnormalities:  normal Narrative Interpretation: no evidence of acute ischemia  ____________________________________________  RADIOLOGY  ED MD interpretation: 2 view chest x-ray shows diffuse interstitial prominence and bilateral opacities reflecting edema as well as bilateral small pleural effusions  Official radiology report(s): DG Chest 2 View  Result Date: 11/21/2020 CLINICAL DATA:  Weakness, shortness of breath EXAM: CHEST - 2 VIEW COMPARISON:  09/17/2020 FINDINGS: Left dialysis catheter remains in place, unchanged. Cardiomegaly. Small bilateral pleural effusions. Diffuse interstitial prominence throughout the lungs. Bibasilar airspace opacities. Findings are similar to prior study. No acute bony abnormality. IMPRESSION: Diffuse interstitial prominence and bibasilar opacities could reflect edema. Small bilateral pleural effusions. Electronically Signed   By: Rolm Baptise M.D.   On: 11/21/2020 11:13    ____________________________________________   PROCEDURES  Procedure(s) performed (including Critical Care):  .1-3 Lead EKG Interpretation Performed by: Naaman Plummer, MD Authorized by: Naaman Plummer, MD     Interpretation: normal     ECG rate:  56   ECG rate assessment: normal     Rhythm: sinus rhythm     Ectopy: none     Conduction: normal       ____________________________________________   INITIAL IMPRESSION / ASSESSMENT AND PLAN / ED COURSE  As part of my medical decision making, I  reviewed the following data within the [place] notes reviewed and incorporated, Labs reviewed, EKG interpreted, Old chart reviewed, Radiograph reviewed and Notes from prior ED visits reviewed and incorporated        Patient is a 60 year old female with a significant amount of past medical history but relevant for today of CHF and ESRD who presents for worsening generalized weakness as well as relatively new abdominal distention. Differential diagnosis includes but is not  limited to: Hepatorenal syndrome, worsening volume overload, acute hypoxic respiratory failure, ACS, ruptured hollow viscus Laboratory evaluation significant for hypokalemia of 3.3.  Patient's anemia, thrombocytopenia, and kidney function is at baseline I spoke with patient at length about her goals of care during her visit today including her need for likely home health/palliative care/hospice.  Upon chart review, patient is set up with palliative care who see her approximately once a month.  Patient is concerned that "they are trying to put me in a home".  Patient is very concerned that she is going to lose her independence if she continues to speak with palliative care about her goals.  Patient is also concerned that her son is trying to "get me to do things I do not want to do".  Patient still makes decisions for herself about her medical care.  I spoke to patient's son on the phone after getting permission from patient who is very concerned about his mother being unable to perform ADLs at home.  Patient and son were both encouraged to discuss these concerns with her primary care physician as well as her palliative care team in order to facilitate an improvement in her quality of life.  They both expressed understanding and all questions were answered prior to discharge      ____________________________________________   FINAL CLINICAL IMPRESSION(S) / ED DIAGNOSES  Final diagnoses:  Abdominal distension  Generalized abdominal pain  Chronic low back pain with bilateral sciatica, unspecified back pain laterality  Chronic pain due to neoplasm     ED Discharge Orders    None       Note:  This document was prepared using Dragon voice recognition software and may include unintentional dictation errors.   Naaman Plummer, MD 11/21/20 [phone]

## 2020-11-23 ENCOUNTER — Other Ambulatory Visit: Payer: Self-pay

## 2020-11-23 ENCOUNTER — Other Ambulatory Visit
Admission: RE | Admit: 2020-11-23 | Discharge: 2020-11-23 | Disposition: A | Discharge status: Home / Self Care | Payer: Medicare HMO | Source: Ambulatory Visit | Attending: Vascular Surgery | Admitting: Vascular Surgery

## 2020-11-23 DIAGNOSIS — Z01812 Encounter for preprocedural laboratory examination: Secondary | ICD-10-CM | POA: Insufficient documentation

## 2020-11-23 DIAGNOSIS — Z20822 Contact with and (suspected) exposure to covid-19: Secondary | ICD-10-CM | POA: Diagnosis not present

## 2020-11-24 LAB — SARS CORONAVIRUS 2 (TAT 6-24 HRS): SARS Coronavirus 2: NEGATIVE

## 2020-11-26 ENCOUNTER — Other Ambulatory Visit (INDEPENDENT_AMBULATORY_CARE_PROVIDER_SITE_OTHER): Payer: Self-pay | Admitting: Nurse Practitioner

## 2020-11-27 ENCOUNTER — Encounter
Admission: RE | Disposition: A | Discharge status: Home / Self Care | Payer: Self-pay | Source: Home / Self Care | Attending: Vascular Surgery

## 2020-11-27 ENCOUNTER — Ambulatory Visit
Admission: RE | Admit: 2020-11-27 | Discharge: 2020-11-27 | Disposition: A | Discharge status: Home / Self Care | Payer: Medicare HMO | Attending: Vascular Surgery | Admitting: Vascular Surgery

## 2020-11-27 ENCOUNTER — Encounter: Payer: Self-pay | Admitting: Vascular Surgery

## 2020-11-27 ENCOUNTER — Other Ambulatory Visit: Payer: Self-pay

## 2020-11-27 DIAGNOSIS — E1122 Type 2 diabetes mellitus with diabetic chronic kidney disease: Secondary | ICD-10-CM | POA: Diagnosis not present

## 2020-11-27 DIAGNOSIS — Z888 Allergy status to other drugs, medicaments and biological substances status: Secondary | ICD-10-CM | POA: Insufficient documentation

## 2020-11-27 DIAGNOSIS — I6523 Occlusion and stenosis of bilateral carotid arteries: Secondary | ICD-10-CM | POA: Insufficient documentation

## 2020-11-27 DIAGNOSIS — I871 Compression of vein: Secondary | ICD-10-CM | POA: Insufficient documentation

## 2020-11-27 DIAGNOSIS — I70213 Atherosclerosis of native arteries of extremities with intermittent claudication, bilateral legs: Secondary | ICD-10-CM | POA: Insufficient documentation

## 2020-11-27 DIAGNOSIS — N186 End stage renal disease: Secondary | ICD-10-CM | POA: Insufficient documentation

## 2020-11-27 DIAGNOSIS — E785 Hyperlipidemia, unspecified: Secondary | ICD-10-CM | POA: Insufficient documentation

## 2020-11-27 DIAGNOSIS — I70218 Atherosclerosis of native arteries of extremities with intermittent claudication, other extremity: Secondary | ICD-10-CM | POA: Diagnosis not present

## 2020-11-27 DIAGNOSIS — I25118 Atherosclerotic heart disease of native coronary artery with other forms of angina pectoris: Secondary | ICD-10-CM | POA: Diagnosis not present

## 2020-11-27 DIAGNOSIS — E1151 Type 2 diabetes mellitus with diabetic peripheral angiopathy without gangrene: Secondary | ICD-10-CM | POA: Diagnosis not present

## 2020-11-27 DIAGNOSIS — I132 Hypertensive heart and chronic kidney disease with heart failure and with stage 5 chronic kidney disease, or end stage renal disease: Secondary | ICD-10-CM | POA: Diagnosis present

## 2020-11-27 DIAGNOSIS — I5032 Chronic diastolic (congestive) heart failure: Secondary | ICD-10-CM | POA: Diagnosis not present

## 2020-11-27 DIAGNOSIS — T82898A Other specified complication of vascular prosthetic devices, implants and grafts, initial encounter: Secondary | ICD-10-CM | POA: Diagnosis not present

## 2020-11-27 DIAGNOSIS — J449 Chronic obstructive pulmonary disease, unspecified: Secondary | ICD-10-CM | POA: Insufficient documentation

## 2020-11-27 DIAGNOSIS — Z992 Dependence on renal dialysis: Secondary | ICD-10-CM | POA: Diagnosis not present

## 2020-11-27 DIAGNOSIS — I771 Stricture of artery: Secondary | ICD-10-CM | POA: Diagnosis not present

## 2020-11-27 HISTORY — PX: UPPER EXTREMITY ANGIOGRAPHY: CATH118270

## 2020-11-27 HISTORY — PX: UPPER EXTREMITY VENOGRAPHY: CATH118272

## 2020-11-27 SURGERY — UPPER EXTREMITY ANGIOGRAPHY
Anesthesia: Moderate Sedation | Laterality: Bilateral

## 2020-11-27 MED ORDER — HYDROCODONE-ACETAMINOPHEN 5-325 MG PO TABS
1.0000 | ORAL_TABLET | Freq: Once | ORAL | Status: AC
  Administered 2020-11-27: 1 via ORAL

## 2020-11-27 MED ORDER — MIDAZOLAM HCL 5 MG/5ML IJ SOLN
INTRAMUSCULAR | Status: AC
  Filled 2020-11-27: qty 5

## 2020-11-27 MED ORDER — METHYLPREDNISOLONE SODIUM SUCC 125 MG IJ SOLR: 125.0000 mg | Freq: Once | INTRAMUSCULAR | Status: DC | PRN

## 2020-11-27 MED ORDER — CEFAZOLIN SODIUM-DEXTROSE 1-4 GM/50ML-% IV SOLN: 1.0000 g | Freq: Once | INTRAVENOUS | Status: AC

## 2020-11-27 MED ORDER — HEPARIN SODIUM (PORCINE) 5000 UNIT/ML IJ SOLN
INTRAMUSCULAR | Status: AC
  Filled 2020-11-27: qty 1

## 2020-11-27 MED ORDER — CEFAZOLIN SODIUM-DEXTROSE 1-4 GM/50ML-% IV SOLN
INTRAVENOUS | Status: AC
  Administered 2020-11-27: 1 g via INTRAVENOUS
  Filled 2020-11-27: qty 50

## 2020-11-27 MED ORDER — MIDAZOLAM HCL 2 MG/ML PO SYRP
ORAL_SOLUTION | ORAL | Status: AC
  Filled 2020-11-27: qty 4

## 2020-11-27 MED ORDER — HEPARIN SODIUM (PORCINE) 1000 UNIT/ML IJ SOLN
INTRAMUSCULAR | Status: AC
  Filled 2020-11-27: qty 1

## 2020-11-27 MED ORDER — HYDRALAZINE HCL 20 MG/ML IJ SOLN
INTRAMUSCULAR | Status: DC | PRN
  Administered 2020-11-27 (×2): 10 mg via INTRAVENOUS

## 2020-11-27 MED ORDER — HYDRALAZINE HCL 20 MG/ML IJ SOLN
INTRAMUSCULAR | Status: AC
  Filled 2020-11-27: qty 1

## 2020-11-27 MED ORDER — SODIUM CHLORIDE 0.9 % IV SOLN: INTRAVENOUS | Status: DC

## 2020-11-27 MED ORDER — MORPHINE SULFATE (PF) 4 MG/ML IV SOLN: 2.0000 mg | INTRAVENOUS | Status: DC | PRN

## 2020-11-27 MED ORDER — FAMOTIDINE 20 MG PO TABS: 40.0000 mg | ORAL_TABLET | Freq: Once | ORAL | Status: DC | PRN

## 2020-11-27 MED ORDER — HYDROMORPHONE HCL 1 MG/ML IJ SOLN
1.0000 mg | Freq: Once | INTRAMUSCULAR | Status: DC | PRN
Start: 2020-11-27 — End: 2020-11-27

## 2020-11-27 MED ORDER — HYDROCODONE-ACETAMINOPHEN 5-325 MG PO TABS
ORAL_TABLET | ORAL | Status: AC
  Filled 2020-11-27: qty 1

## 2020-11-27 MED ORDER — DIPHENHYDRAMINE HCL 50 MG/ML IJ SOLN: 50.0000 mg | Freq: Once | INTRAMUSCULAR | Status: DC | PRN

## 2020-11-27 MED ORDER — FENTANYL CITRATE (PF) 100 MCG/2ML IJ SOLN
INTRAMUSCULAR | Status: AC
  Filled 2020-11-27: qty 2

## 2020-11-27 MED ORDER — HEPARIN SODIUM (PORCINE) 1000 UNIT/ML IJ SOLN
INTRAMUSCULAR | Status: DC | PRN
  Administered 2020-11-27: 4000 [IU] via INTRAVENOUS

## 2020-11-27 MED ORDER — MIDAZOLAM HCL 2 MG/2ML IJ SOLN
INTRAMUSCULAR | Status: DC | PRN
  Administered 2020-11-27: 1 mg via INTRAVENOUS

## 2020-11-27 MED ORDER — MIDAZOLAM HCL 2 MG/ML PO SYRP
8.0000 mg | ORAL_SOLUTION | Freq: Once | ORAL | Status: AC | PRN
  Administered 2020-11-27: 8 mg via ORAL

## 2020-11-27 MED ORDER — FENTANYL CITRATE (PF) 100 MCG/2ML IJ SOLN
INTRAMUSCULAR | Status: DC | PRN
  Administered 2020-11-27: 25 ug via INTRAVENOUS

## 2020-11-27 SURGICAL SUPPLY — 30 items
BALLN LUTONIX DCB 4X40X130 (BALLOONS) ×2
BALLOON LUTONIX DCB 4X40X130 (BALLOONS) ×1 IMPLANT
CANNULA 5F STIFF (CANNULA) ×2 IMPLANT
CATH 0.018 NAVICROSS ST 150 (CATHETERS) ×2 IMPLANT
CATH ANGIO 5F PIGTAIL 100CM (CATHETERS) ×2 IMPLANT
CATH BEACON 5 .035 65 KMP TIP (CATHETERS) ×2 IMPLANT
CATH HEADHUNTER H1 5F 100CM (CATHETERS) ×2 IMPLANT
COVER EZ STRL 42X30 (DRAPES) ×2 IMPLANT
COVER PROBE U/S 5X48 (MISCELLANEOUS) ×6 IMPLANT
DEVICE STARCLOSE SE CLOSURE (Vascular Products) ×2 IMPLANT
DEVICE TORQUE (MISCELLANEOUS) ×2 IMPLANT
DRAPE BRACHIAL (DRAPES) ×2 IMPLANT
GLIDEWIRE ANGLED SS 035X260CM (WIRE) ×2 IMPLANT
GUIDEWIRE ANGLED .035 180CM (WIRE) ×2 IMPLANT
GUIDEWIRE SUPER STIFF .035X180 (WIRE) ×2 IMPLANT
KIT ENCORE 26 ADVANTAGE (KITS) ×2 IMPLANT
PACK ANGIOGRAPHY (CUSTOM PROCEDURE TRAY) ×2 IMPLANT
SHEATH BRITE TIP 5FRX11 (SHEATH) ×2 IMPLANT
SHEATH BRITE TIP 6FRX11 (SHEATH) ×2 IMPLANT
SHEATH BRITE TIP 6FRX5.5 (SHEATH) ×2 IMPLANT
SHEATH SHUTTLE SELECT 6F (SHEATH) ×2 IMPLANT
SHIELD X-DRAPE GOLD 12X17 (MISCELLANEOUS) ×2 IMPLANT
STOPCOCK 3 WAY MALE LL (IV SETS) ×2
STOPCOCK 3WAY MALE LL (IV SETS) ×1 IMPLANT
SUT MNCRL AB 4-0 PS2 18 (SUTURE) ×2 IMPLANT
SYR MEDRAD MARK 7 150ML (SYRINGE) ×2 IMPLANT
TUBING CONTRAST HIGH PRESS 72 (TUBING) ×2 IMPLANT
VALVE CHECKFLO PERFORMER (SHEATH) ×2 IMPLANT
WIRE G V18X300CM (WIRE) ×2 IMPLANT
WIRE GUIDERIGHT .035X150 (WIRE) ×2 IMPLANT

## 2020-11-27 NOTE — Progress Notes (Signed)
Called son, [name], on behalf of patient request and she spoke with him and I answered his questions regarding procedure and discharge home. All questions answered and patient then talked with him.

## 2020-11-27 NOTE — H&P (Signed)
@LOGO @   MRN : [id number]  [name] is a 60 y.o. ([birth year]) female who presents with chief complaint of No chief complaint on file. Marland Kitchen  History of Present Illness:   The patient presents to [facility] today for evaluation of her arms to determine if upper extremity AV access is feasible.  The patient has a history of multiple failed accesses. There have been accesses in both arms.   Current access is via a catheter which is functioning poorly. There have been several episodes of catheter infection as well as graft infection. The Patient has also had life threatening hemorrhage from her left arm graft in the past.  At this point she is grown very frustrated with the catheter.  She does not feel that it is working well.  She is concerned about infection.  She is also concerned about hygiene given that she is unable to get in the shower.  She is exceedingly frustrated with multiple different aspects of her dialysis and how it is impacting her life.  The patient denies amaurosis fugax or recent TIA symptoms. There are no recent neurological changes noted. The patient denies claudication symptoms or rest pain symptoms. The patient denies history of DVT, PE or superficial thrombophlebitis. The patient denies recent episodes of angina or shortness of breath.  No outpatient medications have been marked as taking for the 11/27/20 encounter [facility] Encounter).    Past Medical History:  Diagnosis Date  . Anemia   . Anginal pain ([place])   . Anxiety   . Arthritis   . Asthma   . Broken wrist   . Bronchitis   . chronic diastolic CHF 2/77/4128  . Chronic kidney disease   . COPD (chronic obstructive pulmonary disease) ([place])   . Coronary artery disease    a. cath 2013: stenting to RCA (report not available); b. cath 2014: LM nl, pLAD 40%, mLAD nl, ost LCx 40%, mid LCx nl, pRCA 30% @ site of prior stent, mRCA 50%  . Depression   . Diabetes mellitus ([place])   . Diabetes mellitus without  complication ([place])   . Diabetic neuropathy ([place])   . dialysis 2006  . Diverticulosis   . Dizziness   . Dyspnea   . Elevated lipids   . Environmental and seasonal allergies   . ESRD (end stage renal disease) on dialysis ([place])    M-W-F  . Gastroparesis   . GERD (gastroesophageal reflux disease)   . Headache   . History of anemia due to chronic kidney disease   . History of hiatal hernia   . HOH (hard of hearing)   . Hx of pancreatitis 2015  . Hypertension   . Lower extremity edema   . Mitral regurgitation    a. echo 10/2013: EF 62%, noWMA, mildly dilated LA, mild to mod MR/TR, GR1DD  . Myocardial infarction ([place])   . Orthopnea   . Parathyroid abnormality ([place])   . Peripheral arterial disease ([place])   . Pneumonia   . Renal cancer ([place])   . Renal insufficiency    Pt is on dialysis on M,W + F.  . Wheezing     Past Surgical History:  Procedure Laterality Date  . A/V SHUNTOGRAM Left 01/20/2018   Procedure: A/V SHUNTOGRAM;  Surgeon: Algernon Huxley, MD;  Location: [place] [facility];  Service: Cardiovascular;  Laterality: Left;  . ABDOMINAL HYSTERECTOMY  1992  . AMPUTATION TOE Left 10/02/2017   Procedure: AMPUTATION TOE-LEFT GREAT TOE;  Surgeon: Albertine Patricia, DPM;  Location: [facility] ORS;  Service: Podiatry;  Laterality: Left;  . APPENDECTOMY    . APPLICATION OF WOUND VAC N/A 11/25/2019   Procedure: APPLICATION OF WOUND VAC;  Surgeon: Katha Cabal, MD;  Location: [facility];  Service: Vascular;  Laterality: N/A;  . ARTERY BIOPSY Right 10/11/2018   Procedure: BIOPSY TEMPORAL ARTERY;  Surgeon: Vickie Epley, MD;  Location: [facility];  Service: General;  Laterality: Right;  . CARDIAC CATHETERIZATION Left 07/26/2015   Procedure: Left Heart Cath and Coronary Angiography;  Surgeon: Dionisio David, MD;  Location: [place] [facility];  Service: Cardiovascular;  Laterality: Left;  . CATARACT EXTRACTION W/ INTRAOCULAR LENS IMPLANT Right   . CATARACT EXTRACTION W/PHACO Left 03/10/2017    Procedure: CATARACT EXTRACTION PHACO AND INTRAOCULAR LENS PLACEMENT (IOC);  Surgeon: Birder Robson, MD;  Location: [facility];  Service: Ophthalmology;  Laterality: Left;  [place] 00:51.9 AP% 14.2 CDE 7.39 fluid pack lot # [id number]  . CENTRAL LINE INSERTION Right 11/11/2019   Procedure: CENTRAL LINE INSERTION;  Surgeon: Katha Cabal, MD;  Location: [facility];  Service: Vascular;  Laterality: Right;  . CENTRAL LINE INSERTION  11/25/2019   Procedure: CENTRAL LINE INSERTION;  Surgeon: Katha Cabal, MD;  Location: [facility];  Service: Vascular;;  . CHOLECYSTECTOMY    . COLONOSCOPY WITH PROPOFOL N/A 08/12/2016   Procedure: COLONOSCOPY WITH PROPOFOL;  Surgeon: Lollie Sails, MD;  Location: [facility] ENDOSCOPY;  Service: Endoscopy;  Laterality: N/A;  . DIALYSIS FISTULA CREATION Left    upper arm  . dialysis grafts    . DIALYSIS/PERMA CATHETER INSERTION N/A 11/14/2019   Procedure: DIALYSIS/PERMA CATHETER INSERTION;  Surgeon: Algernon Huxley, MD;  Location: [place] [facility];  Service: Cardiovascular;  Laterality: N/A;  . DIALYSIS/PERMA CATHETER INSERTION N/A 02/03/2020   Procedure: DIALYSIS/PERMA CATHETER INSERTION;  Surgeon: Katha Cabal, MD;  Location: [place] [facility];  Service: Cardiovascular;  Laterality: N/A;  . DIALYSIS/PERMA CATHETER INSERTION N/A 06/19/2020   Procedure: DIALYSIS/PERMA CATHETER INSERTION;  Surgeon: Katha Cabal, MD;  Location: [place] [facility];  Service: Cardiovascular;  Laterality: N/A;  . DIALYSIS/PERMA CATHETER REMOVAL N/A 05/25/2020   Procedure: DIALYSIS/PERMA CATHETER REMOVAL;  Surgeon: Katha Cabal, MD;  Location: [place] [facility];  Service: Cardiovascular;  Laterality: N/A;  . ESOPHAGOGASTRODUODENOSCOPY N/A 03/08/2015   Procedure: ESOPHAGOGASTRODUODENOSCOPY (EGD);  Surgeon: Manya Silvas, MD;  Location: [facility] ENDOSCOPY;  Service: Endoscopy;  Laterality: N/A;  . ESOPHAGOGASTRODUODENOSCOPY (EGD) WITH PROPOFOL N/A 03/18/2016   Procedure:  ESOPHAGOGASTRODUODENOSCOPY (EGD) WITH PROPOFOL;  Surgeon: [name], MD;  Location: [facility] ENDOSCOPY;  Service: Endoscopy;  Laterality: N/A;  . EYE SURGERY Right 2018  . FECAL TRANSPLANT N/A 08/23/2015   Procedure: FECAL TRANSPLANT;  Surgeon: Manya Silvas, MD;  Location: [facility] ENDOSCOPY;  Service: Endoscopy;  Laterality: N/A;  . HAND SURGERY Bilateral   . HEMATOMA EVACUATION Left 11/25/2019   Procedure: EVACUATION HEMATOMA;  Surgeon: Katha Cabal, MD;  Location: [facility];  Service: Vascular;  Laterality: Left;  . I & D EXTREMITY Left 11/25/2019   Procedure: IRRIGATION AND DEBRIDEMENT EXTREMITY;  Surgeon: Katha Cabal, MD;  Location: [facility];  Service: Vascular;  Laterality: Left;  . IR FLUORO GUIDE CV LINE RIGHT  04/06/2020  . IR INJECT/THERA/INC NEEDLE/CATH/PLC EPI/CERV/THOR [facility]  08/13/2020  . IR RADIOLOGIST EVAL & MGMT  07/28/2019  . IR RADIOLOGIST EVAL & MGMT  08/11/2019  . LIGATION OF ARTERIOVENOUS  FISTULA Left 11/11/2019  . LIGATION OF ARTERIOVENOUS  FISTULA Left  11/11/2019   Procedure: LIGATION OF ARTERIOVENOUS  FISTULA;  Surgeon: Katha Cabal, MD;  Location: [facility];  Service: Vascular;  Laterality: Left;  . LIGATIONS OF HERO GRAFT Right 06/13/2020   Procedure: LIGATION / REMOVAL OF RIGHT HERO GRAFT;  Surgeon: Katha Cabal, MD;  Location: [facility];  Service: Vascular;  Laterality: Right;  . PERIPHERAL VASCULAR CATHETERIZATION N/A 12/20/2015   Procedure: Thrombectomy of dialysis access versus permcath placement;  Surgeon: Algernon Huxley, MD;  Location: [place] [facility];  Service: Cardiovascular;  Laterality: N/A;  . PERIPHERAL VASCULAR CATHETERIZATION N/A 12/20/2015   Procedure: A/V Shunt Intervention;  Surgeon: Algernon Huxley, MD;  Location: [place] [facility];  Service: Cardiovascular;  Laterality: N/A;  . PERIPHERAL VASCULAR CATHETERIZATION N/A 12/20/2015   Procedure: A/V Shuntogram/Fistulagram;  Surgeon: Algernon Huxley, MD;  Location: [place] [facility];  Service:  Cardiovascular;  Laterality: N/A;  . PERIPHERAL VASCULAR CATHETERIZATION N/A 01/02/2016   Procedure: A/V Shuntogram/Fistulagram;  Surgeon: Algernon Huxley, MD;  Location: [place] [facility];  Service: Cardiovascular;  Laterality: N/A;  . PERIPHERAL VASCULAR CATHETERIZATION N/A 01/02/2016   Procedure: A/V Shunt Intervention;  Surgeon: Algernon Huxley, MD;  Location: [place] [facility];  Service: Cardiovascular;  Laterality: N/A;  . TEE WITHOUT CARDIOVERSION N/A 06/11/2020   Procedure: TRANSESOPHAGEAL ECHOCARDIOGRAM (TEE);  Surgeon: Teodoro Spray, MD;  Location: [facility];  Service: Cardiovascular;  Laterality: N/A;  . UPPER EXTREMITY VENOGRAPHY Right 01/18/2020   Procedure: UPPER EXTREMITY VENOGRAPHY;  Surgeon: Katha Cabal, MD;  Location: [place] [facility];  Service: Cardiovascular;  Laterality: Right;  Marland Kitchen VASCULAR ACCESS DEVICE INSERTION Right 04/13/2020   Procedure: INSERTION OF HERO VASCULAR ACCESS DEVICE (GRAFT);  Surgeon: Katha Cabal, MD;  Location: [facility];  Service: Vascular;  Laterality: Right;    Social History Social History   Tobacco Use  . Smoking status: Never Smoker  . Smokeless tobacco: Never Used  Vaping Use  . Vaping Use: Never used  Substance Use Topics  . Alcohol use: Not Currently    Comment: glass wine week per pt  . Drug use: Yes    Types: Marijuana    Comment: once a day    Family History Family History  Problem Relation Age of Onset  . Kidney disease Mother   . Diabetes Mother   . Cancer Father   . Kidney disease Sister     Allergies  Allergen Reactions  . Ace Inhibitors Swelling and Anaphylaxis  . Ativan [Lorazepam] Other (See Comments)    Reaction:Hallucinations and headaches  . Compazine [Prochlorperazine Edisylate] Anaphylaxis, Nausea And Vomiting and Other (See Comments)    Other reaction(s): dystonia from this vs. Reglan, 23 Jul - patient relates that she takes promethazine frequently with no problems  . Sumatriptan Succinate Other  (See Comments)    Other reaction(s): delirium and hallucinations per [facility] records  . Zofran [Ondansetron] Nausea And Vomiting    Per pt. she is allergic to zofran or will experience adverse reaction like hallucination   . Losartan Nausea Only  . Prochlorperazine Other (See Comments)    Reaction:  Unknown . Patient does not remember reaction but she does have vertigo and anxiety along with n and v at times. Could be used to treat any of these   . Reglan [Metoclopramide] Other (See Comments)    Per patient her Dr. Evelina Bucy her off it   . Scopolamine Other (See Comments)    Dizziness, also has vertigo already  .  Tape Rash    Plastic tape causes rash  . Tapentadol Rash  . Ultrasound Gel Itching    Patient states the Ultrasound gel caused itching while in skin contact and resolved when wiped away.     REVIEW OF SYSTEMS (Negative unless checked)  Constitutional: [] Weight loss  [] Fever  [] Chills Cardiac: [] Chest pain   [] Chest pressure   [] Palpitations   [] Shortness of breath when laying flat   [] Shortness of breath with exertion. Vascular:  [] Pain in legs with walking   [] Pain in legs at rest  [] History of DVT   [] Phlebitis   [] Swelling in legs   [] Varicose veins   [] Non-healing ulcers Pulmonary:   [] Uses home oxygen   [] Productive cough   [] Hemoptysis   [] Wheeze  [] COPD   [] Asthma Neurologic:  [] Dizziness   [] Seizures   [] History of stroke   [] History of TIA  [] Aphasia   [] Vissual changes   [] Weakness or numbness in arm   [] Weakness or numbness in leg Musculoskeletal:   [] Joint swelling   [] Joint pain   [] Low back pain Hematologic:  [] Easy bruising  [] Easy bleeding   [] Hypercoagulable state   [] Anemic Gastrointestinal:  [] Diarrhea   [] Vomiting  [] Gastroesophageal reflux/heartburn   [] Difficulty swallowing. Genitourinary:  [x] Chronic kidney disease   [] Difficult urination  [] Frequent urination   [] Blood in urine Skin:  [] Rashes   [] Ulcers  Psychological:  [] History of anxiety   []  History of  major depression.  Physical Examination  There were no vitals filed for this visit. There is no height or weight on file to calculate BMI. Gen: WD/WN, NAD Head: [place]/AT, No temporalis wasting.  Ear/Nose/Throat: Hearing grossly intact, nares w/o erythema or drainage Eyes: PER, EOMI, sclera nonicteric.  Neck: Supple, no large masses.   Pulmonary:  Good air movement, no audible wheezing bilaterally, no use of accessory muscles.  Cardiac: RRR, no JVD Vascular: multiple scars both arms from previous access Vessel Right Left  Radial Palpable Palpable  Brachial Palpable Palpable  Gastrointestinal: Non-distended. No guarding/no peritoneal signs.  Musculoskeletal: M/S 5/5 throughout.  No deformity or atrophy.  Neurologic: CN 2-12 intact. Symmetrical.  Speech is fluent. Motor exam as listed above. Psychiatric: Judgment intact, Mood & affect appropriate for pt's clinical situation. Dermatologic: No rashes or ulcers noted.  No changes consistent with cellulitis.   CBC Lab Results  Component Value Date   WBC 3.8 (L) 11/21/2020   HGB 8.7 (L) 11/21/2020   HCT 26.5 (L) 11/21/2020   MCV 83.1 11/21/2020   PLT 108 (L) 11/21/2020    BMET    Component Value Date/Time   NA 138 11/21/2020 1009   NA 135 (L) 09/15/2014 0948   K 3.3 (L) 11/21/2020 1009   K 4.8 09/15/2014 0948   CL 93 (L) 11/21/2020 1009   CL 99 09/15/2014 0948   CO2 31 11/21/2020 1009   CO2 26 09/15/2014 0948   GLUCOSE 83 11/21/2020 1009   GLUCOSE 118 (H) 09/15/2014 0948   BUN 16 11/21/2020 1009   BUN 19 (H) 09/15/2014 0948   CREATININE 4.14 (H) 11/21/2020 1009   CREATININE 6.79 (H) 09/15/2014 0948   CALCIUM 8.8 (L) 11/21/2020 1009   CALCIUM 8.3 (L) 09/15/2014 0948   GFRNONAA 12 (L) 11/21/2020 1009   GFRNONAA 7 (L) 09/15/2014 0948   GFRNONAA 6 (L) 05/31/2014 0432   GFRAA 11 (L) 06/25/2020 0641   GFRAA 8 (L) 09/15/2014 0948   GFRAA 7 (L) 05/31/2014 0432   Estimated Creatinine Clearance: 14.8 mL/min (A) (by  C-G formula  based on SCr of 4.14 mg/dL (H)).  COAG Lab Results  Component Value Date   INR 1.2 09/17/2020   INR 1.3 (H) 06/13/2020   INR 1.0 04/13/2020    Radiology DG Chest 2 View  Result Date: 11/21/2020 CLINICAL DATA:  Weakness, shortness of breath EXAM: CHEST - 2 VIEW COMPARISON:  09/17/2020 FINDINGS: Left dialysis catheter remains in place, unchanged. Cardiomegaly. Small bilateral pleural effusions. Diffuse interstitial prominence throughout the lungs. Bibasilar airspace opacities. Findings are similar to prior study. No acute bony abnormality. IMPRESSION: Diffuse interstitial prominence and bibasilar opacities could reflect edema. Small bilateral pleural effusions. Electronically Signed   By: Rolm Baptise M.D.   On: 11/21/2020 11:13   MR Abdomen Wo Contrast  Result Date: 10/29/2020 CLINICAL DATA:  60 year old female on dialysis with reported renal mass. EXAM: MRI ABDOMEN WITHOUT CONTRAST TECHNIQUE: Multiplanar multisequence MR imaging was performed without the administration of intravenous contrast. COMPARISON:  CT of the abdomen and pelvis with intravenous contrast from April 06, 2020 and interval CTs obtained without contrast. FINDINGS: Lower chest: Small effusions. Grossly similar to prior imaging limited assessment of lung bases on MRI. Hepatobiliary: Marked hepatic iron deposition. No focal hepatic lesion on noncontrast imaging. Prominence of the biliary tree following cholecystectomy is unchanged. Pancreas: Some pancreatic atrophy the with normal pancreatic contour. Preserved relative intrinsic T1 signal in the pancreas despite atrophy. Spleen:  Iron deposition in the spleen.  No focal splenic lesion. Adrenals/Urinary Tract: Adrenal glands are normal. Kidneys with marked atrophy. Previously described renal lesions not well evaluated given anasarca, small size of lesions and lack of contrast. No definitive signs of treatment in these areas. No hydronephrosis. Mild contour irregularity along the medial  RIGHT kidney towards the upper pole approximately 1 cm may correspond to the lesion of concern in this location on the previous exam (image 17 of series 3) Small enhancing focus seen along the lateral upper pole of the RIGHT kidney not well evaluated. Subtle variable signal on steady-state images, image 17 of series 8 approximately 7 mm may correspond to the site of this lesion. Small cyst in the lower pole of the RIGHT kidney and in the interpolar LEFT kidney. Stomach/Bowel: No acute gastrointestinal process to the extent evaluated. Signs of generalized bowel edema in the setting of anasarca. Sigmoid diverticular disease. Vascular/Lymphatic: No aneurysmal dilation of abdominal vasculature. There is no gastrohepatic or hepatoduodenal ligament lymphadenopathy. No retroperitoneal or mesenteric lymphadenopathy. Other: Small volume ascites about the liver and in the pelvis is partially imaged on today's evaluation. Musculoskeletal: Stitches 1 IMPRESSION: 1. Areas of concern in the RIGHT kidney seen on previous imaging from July are not well evaluated. No definite post treatment changes are noted though contrast was not administered. Contours are grossly stable but again not well evaluated. CT follow-up may be the best option for this patient who is on dialysis. 2. Anasarca with increasing body wall edema now with small volume ascites. 3. Small cyst in the lower pole of the RIGHT kidney and interpolar LEFT kidney. 4. Marked hepatic and splenic iron deposition, likely related to exogenous iron administration. 5. Small volume ascites in the setting of anasarca. 6. Sigmoid diverticular disease. Electronically Signed   By: Zetta Bills M.D.   On: 10/29/2020 08:47     Assessment/Plan 1. Complication of vascular access for dialysis, sequela Very difficult situation.  She has had multiple problems with previous access primarily related to steal and ischemic hands and fingers.  But she has also had significant  problems  with infection.  At this time examination reveals her thighs to be very hostile to any kind of AV access given the profound edema.  Undoubtedly she would develop lymph leaks and a prosthesis would be at a inordinate risk for infection.  After lengthy discussion we will proceed with angiography of both upper extremities to determine the status of her arterial system.  If an option for AV access is present then venography will be performed of that upper extremity as well.  Risks and benefits of been reviewed all questions have been answered patient is agreed to proceed.   A total of 35 minutes was spent with this patient and greater than 50% was spent in counseling and coordination of care with the patient.  Discussion included the treatment options for vascular disease including indications for surgery and intervention.  Also discussed is the appropriate timing of treatment.  In addition medical therapy was discussed.  2. Atherosclerosis of native artery of both lower extremities with intermittent claudication (HCC)  Recommend:  The patient has evidence of atherosclerosis of the lower extremities with claudication.  The patient does not voice lifestyle limiting changes at this point in time.  Noninvasive studies do not suggest clinically significant change.  No invasive studies, angiography or surgery at this time The patient should continue walking and begin a more formal exercise program.  The patient should continue antiplatelet therapy and aggressive treatment of the lipid abnormalities  No changes in the patient's medications at this time  3. Bilateral carotid artery stenosis Recommend:  Given the patient's asymptomatic subcritical stenosis no further invasive testing or surgery at this time.  Continue antiplatelet therapy as prescribed Continue management of CAD, HTN and Hyperlipidemia Healthy heart diet,  encouraged exercise at least 4 times per week   4. Coronary  artery disease of native artery of native heart with stable angina pectoris (HCC) Continue cardiac and antihypertensive medications as already ordered and reviewed, no changes at this time.  Continue statin as ordered and reviewed, no changes at this time  Nitrates PRN for chest pain   5. Chronic obstructive pulmonary disease, unspecified COPD type ([place]) Continue pulmonary medications and aerosols as already ordered, these medications have been reviewed and there are no changes at this time.  6. ESRD on hemodialysis ([place]) At the present time the patient has adequate dialysis access.  Continue hemodialysis as ordered without interruption.  Avoid nephrotoxic medications and dehydration.  Further plans per nephrology   Hortencia Pilar, MD  11/27/2020 9:17 AM

## 2020-11-27 NOTE — Op Note (Signed)
[facility]  Percutaneous Study/Intervention Procedural Note   Date of Surgery: 11/27/2020,1:55 PM  Surgeon:Guerry Covington, Dolores Lory   Pre-operative Diagnosis: Atherosclerotic occlusive disease bilateral upper extremities; superior vena cava syndrome; complication dialysis access; end-stage renal disease on hemodialysis  Post-operative diagnosis:  Same  Procedure(s) Performed:  1.  Arch aortogram  2.  Bilateral upper extremity angiography third order catheter placement  3.  Percutaneous transluminal angioplasty right brachial artery to 4 mm with Lutonix drug-eluting balloon  4.  Right arm venogram  5.  Superior venacavogram  6.  Introduction catheter into superior vena cava  7.  Star close right common femoral artery  Anesthesia: Conscious sedation was administered by the interventional radiology RN under my direct supervision. IV Versed plus fentanyl were utilized. Continuous ECG, pulse oximetry and blood pressure was monitored throughout the entire procedure.  Conscious sedation was administered for a total of 84 minutes.  Sheath: 6 French 90 cm shuttle sheath right common femoral artery; 6 French Pinnacle sheath right brachial vein  Contrast: 75 cc   Fluoroscopy Time: 7.4 minutes  Indications:  The patient presents to [facility] with atherosclerotic changes of the upper extremities.  She is currently maintained with a tunneled catheter.  She is undergoing arteriography of the upper extremities as well as venography to determine whether dialysis access can be created.  The risks and benefits as well as alternative therapies for lower extremity revascularization are reviewed with the patient all questions are answered the patient agrees to proceed.  The patient is therefore undergoing angiography with the hope for planning of a new access.   Procedure:  [name] a 60 y.o. female who was identified and appropriate procedural time out was performed.  The  patient was then placed supine on the table and prepped and draped in the usual sterile fashion.  Ultrasound was used to evaluate the right common femoral artery.  It was echolucent and pulsatile indicating it is patent .  An ultrasound image was acquired for the permanent record.  A micropuncture needle was used to access the right common femoral artery under direct ultrasound guidance.  The microwire was then advanced under fluoroscopic guidance without difficulty followed by the micro-sheath.  A 0.035 J wire was advanced without resistance and a 5Fr sheath was placed.    Based on this image the stiff angle Glidewire was reintroduced pigtail catheter was removed and H1 catheter advanced over the wire.  The left subclavian was then selected and the catheter wire combination were advanced out into the distal subclavian.  Hand-injection contrast was then used to demonstrate the proximal left arm arterial system.  Wire was reintroduced and the catheter was negotiated into the brachial artery where the distal runoff was obtained.  After review of these images the catheter was pulled back over the wire into the aortic arch.  The H1 catheter and stiff angled Glidewire were then used to select the innominate and then the right subclavian.  Hand-injection contrast was used to demonstrate the distal subclavian and axillary artery.  Wire was reintroduced and the catheter negotiated into the brachial artery.  Hand-injection of the brachial artery and distal runoff was then obtained.  5000 units of heparin was given and allowed to circulate and the sheath was upsized from a 5 [place] Pinnacle sheath to a 90 cm shuttle sheath.  Shuttle sheath was advanced and the tip positioned in the mid right brachial artery.  A V 18 wire was then used to cross  the lesion.  The lesion identified was 2 greater than 90% stenoses in the distal brachial artery they were separated by distance of approximately 1 cm.  Both of them were very  focal and short segment.  A 4 mm x 40 mm Lutonix drug-eluting balloon was advanced across these lesions this balloon covered both lesions.  Inflation was to 12 atm for 2 full minutes.  Follow-up imaging demonstrated less than 5% residual stenosis.  A seeker catheter was then introduced into the distal brachial artery and hand-injection of contrast used to create distal runoff.  After review of these images it was clear that the right upper extremity offers the best situation for a access and therefore I elected to perform venography of the right arm.  After review of the images the catheter was removed over wire and an RAO view of the right groin was obtained. StarClose device was deployed without difficulty.  Next the patient was repositioned with the arm extended palm upward the arm was prepped and draped in a sterile fashion ultrasound was placed into a sterile sleeve and delivered back to the operative field.  Brachial vein was identified with the ultrasound brachial vein was echolucent compressible indicating patency images recorded for the permanent record.  1% lidocaine is infiltrated the soft tissues and under direct ultrasound visualization a micro needle is inserted into the brachial vein microwire followed by microsheath is advanced.  J-wire followed by a 6 French sheath.  Hand-injection of contrast was used to create images of the venous system of the right arm from the level of the elbow to the level of the axilla.  Central images were inadequate and a Glidewire was then advanced under fluoroscopic guidance and a Kumpe catheter advanced over the wire and positioned first in the subclavian then in the superior vena cava.  Hand-injection of contrast at these locations completed the imaging.  Catheter was removed over wire and the 6 French sheath was pulled and pressure was held.   Findings:   Arch Aortogram: The aortic arch is a type I it is widely patent.  The origins and proximal segments of  all the great vessels are widely patent.  Right Upper Extremity: The innominate subclavian axillary and proximal two thirds of the brachial artery are widely patent.  In the distal brachial artery and what appears to be associated with a previous graft there are 2 focal greater than 90% stenoses in the brachial artery.  These are separated by proximal 1 cm.  Distally there is three-vessel runoff in the proximal forearm the radial artery does go down to the hand and fills the palmar arch although the digital vessels are poorly seen the interosseous is small does not cross the wrist.  The ulnar artery is patent proximally but occludes distally prior to the wrist.  Following angioplasty to 4 mm the brachial artery strictures are well treated with less than 5% residual stenosis.  Venography of the right upper extremity demonstrates that there is only 1 brachial vein at this point that is visualized.  There is a moderate narrowing of the axillary vein but this is smooth and in the more distal segment.  The subclavian vein on the right appears patent there is mild may be 30-40% stenosis of the innominate vein on the right superior vena cava is widely patent.  Left Lower Extremity: The left subclavian axillary and proximal brachial arteries are widely patent.  There is a previous placed Viabahn stent in the distal brachial artery and  just proximal to this there are 2 focal stenoses are greater than 80%.  Distally the trifurcation is patent the radial and ulnar arteries are patent proximally but there is extremely poor filling distally.  The radial artery does appear to cross the wrist but there is nonfilling of the palmar arch or the digital vessels.   Disposition: Patient was taken to the recovery room in stable condition having tolerated the procedure well.  Belenda Cruise Hanad Leino 11/27/2020,1:55 PM

## 2020-11-29 ENCOUNTER — Encounter (INDEPENDENT_AMBULATORY_CARE_PROVIDER_SITE_OTHER): Payer: Medicare HMO

## 2020-11-29 ENCOUNTER — Ambulatory Visit (INDEPENDENT_AMBULATORY_CARE_PROVIDER_SITE_OTHER): Payer: Medicare HMO | Admitting: Vascular Surgery

## 2020-12-04 ENCOUNTER — Observation Stay: Payer: Medicare HMO

## 2020-12-04 ENCOUNTER — Other Ambulatory Visit: Payer: Self-pay

## 2020-12-04 ENCOUNTER — Inpatient Hospital Stay
Admission: EM | Admit: 2020-12-04 | Discharge: 2020-12-17 | DRG: 291 | Disposition: A | Discharge status: Skilled Nursing Facility | Payer: Medicare HMO | Attending: Internal Medicine | Admitting: Internal Medicine

## 2020-12-04 ENCOUNTER — Emergency Department: Payer: Medicare HMO

## 2020-12-04 DIAGNOSIS — I132 Hypertensive heart and chronic kidney disease with heart failure and with stage 5 chronic kidney disease, or end stage renal disease: Secondary | ICD-10-CM | POA: Diagnosis not present

## 2020-12-04 DIAGNOSIS — F4323 Adjustment disorder with mixed anxiety and depressed mood: Secondary | ICD-10-CM | POA: Diagnosis present

## 2020-12-04 DIAGNOSIS — I890 Lymphedema, not elsewhere classified: Secondary | ICD-10-CM | POA: Diagnosis present

## 2020-12-04 DIAGNOSIS — Z885 Allergy status to narcotic agent status: Secondary | ICD-10-CM

## 2020-12-04 DIAGNOSIS — J811 Chronic pulmonary edema: Secondary | ICD-10-CM

## 2020-12-04 DIAGNOSIS — E876 Hypokalemia: Secondary | ICD-10-CM | POA: Diagnosis present

## 2020-12-04 DIAGNOSIS — Z888 Allergy status to other drugs, medicaments and biological substances status: Secondary | ICD-10-CM

## 2020-12-04 DIAGNOSIS — E11649 Type 2 diabetes mellitus with hypoglycemia without coma: Secondary | ICD-10-CM | POA: Diagnosis present

## 2020-12-04 DIAGNOSIS — E1151 Type 2 diabetes mellitus with diabetic peripheral angiopathy without gangrene: Secondary | ICD-10-CM | POA: Diagnosis present

## 2020-12-04 DIAGNOSIS — Z85528 Personal history of other malignant neoplasm of kidney: Secondary | ICD-10-CM

## 2020-12-04 DIAGNOSIS — I2510 Atherosclerotic heart disease of native coronary artery without angina pectoris: Secondary | ICD-10-CM | POA: Diagnosis present

## 2020-12-04 DIAGNOSIS — I10 Essential (primary) hypertension: Secondary | ICD-10-CM | POA: Diagnosis not present

## 2020-12-04 DIAGNOSIS — Z20822 Contact with and (suspected) exposure to covid-19: Secondary | ICD-10-CM | POA: Diagnosis present

## 2020-12-04 DIAGNOSIS — E8770 Fluid overload, unspecified: Secondary | ICD-10-CM | POA: Diagnosis present

## 2020-12-04 DIAGNOSIS — E669 Obesity, unspecified: Secondary | ICD-10-CM | POA: Diagnosis present

## 2020-12-04 DIAGNOSIS — D631 Anemia in chronic kidney disease: Secondary | ICD-10-CM | POA: Diagnosis present

## 2020-12-04 DIAGNOSIS — Z89422 Acquired absence of other left toe(s): Secondary | ICD-10-CM

## 2020-12-04 DIAGNOSIS — Z7982 Long term (current) use of aspirin: Secondary | ICD-10-CM

## 2020-12-04 DIAGNOSIS — K3184 Gastroparesis: Secondary | ICD-10-CM | POA: Diagnosis present

## 2020-12-04 DIAGNOSIS — Z90710 Acquired absence of both cervix and uterus: Secondary | ICD-10-CM

## 2020-12-04 DIAGNOSIS — Z79899 Other long term (current) drug therapy: Secondary | ICD-10-CM

## 2020-12-04 DIAGNOSIS — Z833 Family history of diabetes mellitus: Secondary | ICD-10-CM

## 2020-12-04 DIAGNOSIS — Z961 Presence of intraocular lens: Secondary | ICD-10-CM | POA: Diagnosis present

## 2020-12-04 DIAGNOSIS — J449 Chronic obstructive pulmonary disease, unspecified: Secondary | ICD-10-CM | POA: Diagnosis present

## 2020-12-04 DIAGNOSIS — Z992 Dependence on renal dialysis: Secondary | ICD-10-CM

## 2020-12-04 DIAGNOSIS — I252 Old myocardial infarction: Secondary | ICD-10-CM

## 2020-12-04 DIAGNOSIS — Z9842 Cataract extraction status, left eye: Secondary | ICD-10-CM

## 2020-12-04 DIAGNOSIS — D649 Anemia, unspecified: Secondary | ICD-10-CM | POA: Diagnosis present

## 2020-12-04 DIAGNOSIS — Z66 Do not resuscitate: Secondary | ICD-10-CM | POA: Diagnosis present

## 2020-12-04 DIAGNOSIS — R52 Pain, unspecified: Secondary | ICD-10-CM | POA: Diagnosis not present

## 2020-12-04 DIAGNOSIS — I5032 Chronic diastolic (congestive) heart failure: Secondary | ICD-10-CM | POA: Diagnosis present

## 2020-12-04 DIAGNOSIS — E1122 Type 2 diabetes mellitus with diabetic chronic kidney disease: Secondary | ICD-10-CM | POA: Diagnosis present

## 2020-12-04 DIAGNOSIS — M1990 Unspecified osteoarthritis, unspecified site: Secondary | ICD-10-CM | POA: Diagnosis present

## 2020-12-04 DIAGNOSIS — Z91048 Other nonmedicinal substance allergy status: Secondary | ICD-10-CM

## 2020-12-04 DIAGNOSIS — D72819 Decreased white blood cell count, unspecified: Secondary | ICD-10-CM | POA: Diagnosis present

## 2020-12-04 DIAGNOSIS — Z6832 Body mass index (BMI) 32.0-32.9, adult: Secondary | ICD-10-CM

## 2020-12-04 DIAGNOSIS — Z841 Family history of disorders of kidney and ureter: Secondary | ICD-10-CM

## 2020-12-04 DIAGNOSIS — J9601 Acute respiratory failure with hypoxia: Secondary | ICD-10-CM | POA: Diagnosis present

## 2020-12-04 DIAGNOSIS — R601 Generalized edema: Secondary | ICD-10-CM

## 2020-12-04 DIAGNOSIS — E1143 Type 2 diabetes mellitus with diabetic autonomic (poly)neuropathy: Secondary | ICD-10-CM | POA: Diagnosis present

## 2020-12-04 DIAGNOSIS — K219 Gastro-esophageal reflux disease without esophagitis: Secondary | ICD-10-CM | POA: Diagnosis present

## 2020-12-04 DIAGNOSIS — Z9841 Cataract extraction status, right eye: Secondary | ICD-10-CM

## 2020-12-04 DIAGNOSIS — Z8701 Personal history of pneumonia (recurrent): Secondary | ICD-10-CM

## 2020-12-04 DIAGNOSIS — R188 Other ascites: Secondary | ICD-10-CM

## 2020-12-04 DIAGNOSIS — N186 End stage renal disease: Secondary | ICD-10-CM | POA: Diagnosis present

## 2020-12-04 DIAGNOSIS — I340 Nonrheumatic mitral (valve) insufficiency: Secondary | ICD-10-CM | POA: Diagnosis present

## 2020-12-04 DIAGNOSIS — R001 Bradycardia, unspecified: Secondary | ICD-10-CM | POA: Diagnosis present

## 2020-12-04 LAB — GLUCOSE, RANDOM: Glucose, Bld: 46 mg/dL — ABNORMAL LOW (ref 70–99)

## 2020-12-04 LAB — CBC WITH DIFFERENTIAL/PLATELET
Abs Immature Granulocytes: 0.01 10*3/uL (ref 0.00–0.07)
Basophils Absolute: 0 10*3/uL (ref 0.0–0.1)
Basophils Relative: 1 %
Eosinophils Absolute: 1 10*3/uL — ABNORMAL HIGH (ref 0.0–0.5)
Eosinophils Relative: 26 %
HCT: 21.7 % — ABNORMAL LOW (ref 36.0–46.0)
Hemoglobin: 7.1 g/dL — ABNORMAL LOW (ref 12.0–15.0)
Immature Granulocytes: 0 %
Lymphocytes Relative: 30 %
Lymphs Abs: 1.1 10*3/uL (ref 0.7–4.0)
MCH: 26.9 pg (ref 26.0–34.0)
MCHC: 32.7 g/dL (ref 30.0–36.0)
MCV: 82.2 fL (ref 80.0–100.0)
Monocytes Absolute: 0.2 10*3/uL (ref 0.1–1.0)
Monocytes Relative: 6 %
Neutro Abs: 1.5 10*3/uL — ABNORMAL LOW (ref 1.7–7.7)
Neutrophils Relative %: 37 %
Platelets: 93 10*3/uL — ABNORMAL LOW (ref 150–400)
RBC: 2.64 MIL/uL — ABNORMAL LOW (ref 3.87–5.11)
RDW: 20.1 % — ABNORMAL HIGH (ref 11.5–15.5)
WBC: 3.9 10*3/uL — ABNORMAL LOW (ref 4.0–10.5)
nRBC: 0 % (ref 0.0–0.2)

## 2020-12-04 LAB — BRAIN NATRIURETIC PEPTIDE: B Natriuretic Peptide: 756.4 pg/mL — ABNORMAL HIGH (ref 0.0–100.0)

## 2020-12-04 LAB — SAMPLE TO BLOOD BANK

## 2020-12-04 LAB — RESP PANEL BY RT-PCR (FLU A&B, COVID) ARPGX2
Influenza A by PCR: NEGATIVE
Influenza B by PCR: NEGATIVE
SARS Coronavirus 2 by RT PCR: NEGATIVE

## 2020-12-04 LAB — COMPREHENSIVE METABOLIC PANEL
ALT: <5 U/L (ref 0–44)
AST: 22 U/L (ref 15–41)
Albumin: 2.8 g/dL — ABNORMAL LOW (ref 3.5–5.0)
Alkaline Phosphatase: 69 U/L (ref 38–126)
Anion gap: 11 (ref 5–15)
BUN: 14 mg/dL (ref 8–23)
CO2: 37 mmol/L — ABNORMAL HIGH (ref 22–32)
Calcium: 8.1 mg/dL — ABNORMAL LOW (ref 8.9–10.3)
Chloride: 93 mmol/L — ABNORMAL LOW (ref 98–111)
Creatinine, Ser: 4.33 mg/dL — ABNORMAL HIGH (ref 0.44–1.00)
GFR, Estimated: 11 mL/min — ABNORMAL LOW (ref >60)
Glucose, Bld: 51 mg/dL — ABNORMAL LOW (ref 70–99)
Potassium: 3 mmol/L — ABNORMAL LOW (ref 3.5–5.1)
Sodium: 141 mmol/L (ref 135–145)
Total Bilirubin: 0.6 mg/dL (ref 0.3–1.2)
Total Protein: 6.4 g/dL — ABNORMAL LOW (ref 6.5–8.1)

## 2020-12-04 LAB — TROPONIN I (HIGH SENSITIVITY)
Troponin I (High Sensitivity): 8 ng/L (ref <18)
Troponin I (High Sensitivity): 8 ng/L (ref <18)

## 2020-12-04 LAB — LACTIC ACID, PLASMA
Lactic Acid, Venous: 0.7 mmol/L (ref 0.5–1.9)
Lactic Acid, Venous: 0.7 mmol/L (ref 0.5–1.9)

## 2020-12-04 LAB — CBG MONITORING, ED
Glucose-Capillary: 100 mg/dL — ABNORMAL HIGH (ref 70–99)
Glucose-Capillary: 71 mg/dL (ref 70–99)
Glucose-Capillary: 82 mg/dL (ref 70–99)

## 2020-12-04 MED ORDER — MECLIZINE HCL 25 MG PO TABS
25.0000 mg | ORAL_TABLET | Freq: Four times a day (QID) | ORAL | Status: DC | PRN
  Administered 2020-12-05 – 2020-12-17 (×9): 25 mg via ORAL
  Filled 2020-12-04 (×10): qty 1

## 2020-12-04 MED ORDER — HYDROCODONE-ACETAMINOPHEN 5-325 MG PO TABS
1.0000 | ORAL_TABLET | Freq: Three times a day (TID) | ORAL | Status: DC | PRN
Start: 2020-12-04 — End: 2020-12-17
  Administered 2020-12-05 – 2020-12-16 (×9): 1 via ORAL
  Filled 2020-12-04 (×9): qty 1

## 2020-12-04 MED ORDER — GABAPENTIN 100 MG PO CAPS
100.0000 mg | ORAL_CAPSULE | Freq: Every evening | ORAL | Status: DC
  Administered 2020-12-05 – 2020-12-16 (×13): 100 mg via ORAL
  Filled 2020-12-04 (×14): qty 1

## 2020-12-04 MED ORDER — HYDROXYZINE HCL 25 MG PO TABS
25.0000 mg | ORAL_TABLET | Freq: Every day | ORAL | Status: DC
  Administered 2020-12-05 – 2020-12-17 (×13): 25 mg via ORAL
  Filled 2020-12-04 (×13): qty 1

## 2020-12-04 MED ORDER — FUROSEMIDE 10 MG/ML IJ SOLN
80.0000 mg | Freq: Every day | INTRAMUSCULAR | Status: DC
  Administered 2020-12-05 – 2020-12-17 (×10): 80 mg via INTRAVENOUS
  Filled 2020-12-04 (×10): qty 8

## 2020-12-04 MED ORDER — VITAMIN B-12 1000 MCG PO TABS
1000.0000 ug | ORAL_TABLET | Freq: Every day | ORAL | Status: DC
  Administered 2020-12-05 – 2020-12-17 (×11): 1000 ug via ORAL
  Filled 2020-12-04 (×14): qty 1

## 2020-12-04 MED ORDER — DEXTROSE 50 % IV SOLN
1.0000 | Freq: Once | INTRAVENOUS | Status: AC
  Administered 2020-12-04: 50 mL via INTRAVENOUS
  Filled 2020-12-04: qty 50

## 2020-12-04 MED ORDER — FUROSEMIDE 10 MG/ML IJ SOLN
80.0000 mg | Freq: Once | INTRAMUSCULAR | Status: AC
  Administered 2020-12-04: 80 mg via INTRAVENOUS
  Filled 2020-12-04: qty 8

## 2020-12-04 MED ORDER — ASPIRIN EC 81 MG PO TBEC
81.0000 mg | DELAYED_RELEASE_TABLET | Freq: Every day | ORAL | Status: DC
  Administered 2020-12-05: 81 mg via ORAL
  Filled 2020-12-04: qty 1

## 2020-12-04 MED ORDER — SEVELAMER CARBONATE 800 MG PO TABS
800.0000 mg | ORAL_TABLET | Freq: Every day | ORAL | Status: DC | PRN
  Filled 2020-12-04: qty 1

## 2020-12-04 MED ORDER — DEXTROSE 50 % IV SOLN: 12.5000 g | INTRAVENOUS | Status: AC

## 2020-12-04 MED ORDER — CALCIUM CARBONATE ANTACID 500 MG PO CHEW: 500.0000 mg | CHEWABLE_TABLET | Freq: Four times a day (QID) | ORAL | Status: DC | PRN

## 2020-12-04 MED ORDER — HEPARIN SODIUM (PORCINE) 5000 UNIT/ML IJ SOLN
5000.0000 [IU] | Freq: Three times a day (TID) | INTRAMUSCULAR | Status: DC
  Administered 2020-12-05 – 2020-12-17 (×29): 5000 [IU] via SUBCUTANEOUS
  Filled 2020-12-04 (×30): qty 1

## 2020-12-04 MED ORDER — PANCRELIPASE (LIP-PROT-AMYL) 36000-114000 UNITS PO CPEP
36000.0000 [IU] | ORAL_CAPSULE | Freq: Three times a day (TID) | ORAL | Status: DC
  Administered 2020-12-05 – 2020-12-17 (×34): 36000 [IU] via ORAL
  Filled 2020-12-04 (×3): qty 1
  Filled 2020-12-04 (×4): qty 3
  Filled 2020-12-04 (×15): qty 1
  Filled 2020-12-04: qty 3
  Filled 2020-12-04 (×4): qty 1
  Filled 2020-12-04: qty 3
  Filled 2020-12-04: qty 1
  Filled 2020-12-04: qty 3
  Filled 2020-12-04 (×12): qty 1

## 2020-12-04 MED ORDER — POTASSIUM CHLORIDE CRYS ER 20 MEQ PO TBCR
40.0000 meq | EXTENDED_RELEASE_TABLET | Freq: Once | ORAL | Status: AC
  Administered 2020-12-05: 40 meq via ORAL
  Filled 2020-12-04: qty 2

## 2020-12-04 MED ORDER — SEVELAMER CARBONATE 800 MG PO TABS: 800.0000 mg | ORAL_TABLET | Freq: Four times a day (QID) | ORAL | Status: DC

## 2020-12-04 MED ORDER — AMLODIPINE BESYLATE 10 MG PO TABS
10.0000 mg | ORAL_TABLET | Freq: Every day | ORAL | Status: DC
  Administered 2020-12-05 – 2020-12-17 (×9): 10 mg via ORAL
  Filled 2020-12-04 (×9): qty 1

## 2020-12-04 MED ORDER — ATORVASTATIN CALCIUM 20 MG PO TABS
20.0000 mg | ORAL_TABLET | Freq: Every evening | ORAL | Status: DC
  Administered 2020-12-05 – 2020-12-16 (×13): 20 mg via ORAL
  Filled 2020-12-04 (×14): qty 1

## 2020-12-04 MED ORDER — HYDRALAZINE HCL 50 MG PO TABS
50.0000 mg | ORAL_TABLET | Freq: Three times a day (TID) | ORAL | Status: DC
  Administered 2020-12-05 – 2020-12-17 (×29): 50 mg via ORAL
  Filled 2020-12-04 (×31): qty 1

## 2020-12-04 MED ORDER — MORPHINE SULFATE (PF) 2 MG/ML IV SOLN
2.0000 mg | Freq: Once | INTRAVENOUS | Status: AC
Start: 2020-12-04 — End: 2020-12-04
  Administered 2020-12-04: 2 mg via INTRAVENOUS
  Filled 2020-12-04: qty 1

## 2020-12-04 MED ORDER — PANTOPRAZOLE SODIUM 40 MG PO TBEC
40.0000 mg | DELAYED_RELEASE_TABLET | Freq: Every day | ORAL | Status: DC
  Administered 2020-12-05 – 2020-12-17 (×12): 40 mg via ORAL
  Filled 2020-12-04 (×14): qty 1

## 2020-12-04 MED ORDER — DEXTROSE 50 % IV SOLN
INTRAVENOUS | Status: AC
  Administered 2020-12-04: 12.5 g via INTRAVENOUS
  Filled 2020-12-04: qty 50

## 2020-12-04 MED ORDER — SEVELAMER CARBONATE 800 MG PO TABS
800.0000 mg | ORAL_TABLET | Freq: Three times a day (TID) | ORAL | Status: DC
  Administered 2020-12-05 – 2020-12-17 (×34): 800 mg via ORAL
  Filled 2020-12-04 (×35): qty 1

## 2020-12-04 MED ORDER — CHOLESTYRAMINE LIGHT 4 G PO PACK
4.0000 g | PACK | Freq: Two times a day (BID) | ORAL | Status: DC
  Administered 2020-12-05 – 2020-12-12 (×5): 4 g via ORAL
  Filled 2020-12-04 (×22): qty 1

## 2020-12-04 NOTE — ED Provider Notes (Signed)
[facility] Emergency Department Provider Note ____________________________________________   Event Date/Time   First MD Initiated Contact with Patient 12/04/20 1557     (approximate)  I have reviewed the triage vital signs and the nursing notes.   HISTORY  Chief Complaint Weakness    HPI [name] is a 60 y.o. female with PMH as noted below including ESRD on dialysis TTS, COPD, CHF, diabetes who presents with generalized weakness and swelling over the last several days.  The patient states that she has chronic edema but states that it has worsened recently.  She reports generalized body pain over the same time period.  She states that she missed dialysis 3 days ago because she felt too weak.  Today she went to dialysis and it was completed (6 L were taken off per EMS) but afterwards she felt too weak to be able to get to her car and EMS was called.  She reports chronic shortness of breath with no acute change.  Past Medical History:  Diagnosis Date  . Anemia   . Anginal pain ([place])   . Anxiety   . Arthritis   . Asthma   . Broken wrist   . Bronchitis   . chronic diastolic CHF 6/54/6503  . Chronic kidney disease   . COPD (chronic obstructive pulmonary disease) ([place])   . Coronary artery disease    a. cath 2013: stenting to RCA (report not available); b. cath 2014: LM nl, pLAD 40%, mLAD nl, ost LCx 40%, mid LCx nl, pRCA 30% @ site of prior stent, mRCA 50%  . Depression   . Diabetes mellitus ([place])   . Diabetes mellitus without complication ([place])   . Diabetic neuropathy ([place])   . dialysis 2006  . Diverticulosis   . Dizziness   . Dyspnea   . Elevated lipids   . Environmental and seasonal allergies   . ESRD (end stage renal disease) on dialysis ([place])    M-W-F  . Gastroparesis   . GERD (gastroesophageal reflux disease)   . Headache   . History of anemia due to chronic kidney disease   . History of hiatal hernia   . HOH (hard of hearing)   . Hx of  pancreatitis 2015  . Hypertension   . Lower extremity edema   . Mitral regurgitation    a. echo 10/2013: EF 62%, noWMA, mildly dilated LA, mild to mod MR/TR, GR1DD  . Myocardial infarction ([place])   . Orthopnea   . Parathyroid abnormality ([place])   . Peripheral arterial disease ([place])   . Pneumonia   . Renal cancer ([place])   . Renal insufficiency    Pt is on dialysis on M,W + F.  . Wheezing     Patient Active Problem List   Diagnosis Date Noted  . Bradycardia 09/17/2020  . Advanced care planning/counseling discussion   . Discitis of thoracic region   . Fluid overload 08/10/2020  . HLD (hyperlipidemia) 08/10/2020  . Chest pain 08/10/2020  . CAD (coronary artery disease) 08/10/2020  . GERD (gastroesophageal reflux disease) 08/10/2020  . Obesity (BMI 30-39.9) 06/06/2020  . Anemia in ESRD (end-stage renal disease) ([place]) 05/31/2020  . Type 1 diabetes mellitus ([place]) 05/31/2020  . End-stage renal disease ([place]) 05/31/2020  . Hypertensive disorder 05/31/2020  . Arm pain 05/21/2020  . Cellulitis of right leg 05/02/2020  . ESRD (end stage renal disease) ([place]) 04/13/2020  . SOBOE (shortness of breath on exertion) 02/08/2020  . Hematoma of arm, left,  subsequent encounter 11/24/2019  . Respiratory failure ([place])   . Shortness of breath   . Complication of arteriovenous dialysis fistula 11/13/2019  . PVD (peripheral vascular disease) ([place]) 11/13/2019  . Severe anemia 11/13/2019  . Pressure injury of skin 11/13/2019  . ESRD on hemodialysis ([place])   . Hemorrhagic shock ([place]) 11/11/2019  . Leg pain 10/02/2019  . CAP (community acquired pneumonia) 10/01/2019  . Atherosclerosis of native arteries of extremity with intermittent claudication ([place]) 05/01/2019  . Right-sided headache   . COPD (chronic obstructive pulmonary disease) ([place]) 10/06/2018  . Syncope 10/04/2018  . Symptomatic anemia 06/18/2018  . Gastroparesis due to DM ([place]) 01/18/2018  . Complication of vascular access for dialysis  12/04/2017  . Osteomyelitis ([place]) 09/30/2017  . Carotid stenosis 06/18/2017  . Bilateral carotid artery stenosis 06/16/2017  . Benign essential HTN 05/19/2017  . CKD (chronic kidney disease) stage 5, GFR less than 15 ml/min (HCC) 05/19/2017  . Hyperlipidemia, mixed 05/19/2017  . Shortness of breath 05/04/2017  . Rash, skin 12/16/2016  . Cellulitis of lower extremity 07/29/2016  . Chronic venous insufficiency 07/29/2016  . Lymphedema 07/29/2016  . TIA (transient ischemic attack) 04/21/2016  . Altered mental status 04/08/2016  . Hyperammonemia ([place]) 04/08/2016  . Elevated troponin 04/08/2016  . Depression 04/08/2016  . Depression, major, recurrent, severe with psychosis ([place]) 04/08/2016  . Blood in stool   . Intractable cyclical vomiting with nausea   . Reflux esophagitis   . Gastritis   . Generalized abdominal pain   . Uncontrollable vomiting   . Major depressive disorder, recurrent episode, moderate ([place]) 03/15/2016  . Adjustment disorder with mixed anxiety and depressed mood 03/15/2016  . Malnutrition of moderate degree 12/01/2015  . Renal mass   . Dyspnea   . Acute renal failure ([place])   . Respiratory failure ([place])   . High temperature 11/14/2015  . Encounter for central line placement   . Encounter for orogastric (OG) tube placement   . Nausea 11/12/2015  . Hyperkalemia 10/03/2015  . Diarrhea, unspecified 07/22/2015  . Pneumonia 05/21/2015  . Hypoglycemia 04/24/2015  . Unresponsiveness 04/24/2015  . Sinus bradycardia 04/24/2015  . Hypothermia 04/24/2015  . Acute respiratory failure ([place]) 04/24/2015  . Acute on chronic diastolic CHF (congestive heart failure) ([place]) 04/05/2015  . Diabetic gastroparesis ([place]) 04/05/2015  . Hypokalemia 04/05/2015  . Generalized weakness 04/05/2015  . Acute pulmonary edema ([place]) 04/03/2015  . Nausea and vomiting 04/03/2015  . Hypoglycemia associated with diabetes ([place]) 04/03/2015  . Anemia of chronic disease 04/03/2015  . Secondary  hyperparathyroidism ([place]) 04/03/2015  . Pressure ulcer 04/02/2015  . Acute respiratory failure with hypoxia ([place]) 04/01/2015  . Adjustment disorder with anxiety 03/14/2015  . Somatic symptom disorder, mild 03/08/2015  . Coronary artery disease involving native coronary artery of native heart without angina pectoris   . Nausea & vomiting 03/06/2015  . Abdominal pain 03/06/2015  . Type 2 diabetes mellitus with hypoglycemia without coma, without long-term current use of insulin ([place]) 03/06/2015  . HTN (hypertension) 03/06/2015  . Gastroparesis 02/24/2015  . Pleural effusion 02/19/2015  . Bacteremia due to Enterococcus 02/19/2015  . End-stage renal disease on hemodialysis ([place]) 02/19/2015  . Diarrhea 08/01/2014    Past Surgical History:  Procedure Laterality Date  . A/V SHUNTOGRAM Left 01/20/2018   Procedure: A/V SHUNTOGRAM;  Surgeon: Algernon Huxley, MD;  Location: [facility] [facility];  Service: Cardiovascular;  Laterality: Left;  . ABDOMINAL HYSTERECTOMY  1992  . AMPUTATION TOE Left 10/02/2017   Procedure:  AMPUTATION TOE-LEFT GREAT TOE;  Surgeon: Albertine Patricia, DPM;  Location: [facility];  Service: Podiatry;  Laterality: Left;  . APPENDECTOMY    . APPLICATION OF WOUND VAC N/A 11/25/2019   Procedure: APPLICATION OF WOUND VAC;  Surgeon: Katha Cabal, MD;  Location: [facility];  Service: Vascular;  Laterality: N/A;  . ARTERY BIOPSY Right 10/11/2018   Procedure: BIOPSY TEMPORAL ARTERY;  Surgeon: Vickie Epley, MD;  Location: [facility];  Service: General;  Laterality: Right;  . CARDIAC CATHETERIZATION Left 07/26/2015   Procedure: Left Heart Cath and Coronary Angiography;  Surgeon: Dionisio David, MD;  Location: [facility];  Service: Cardiovascular;  Laterality: Left;  . CATARACT EXTRACTION W/ INTRAOCULAR LENS IMPLANT Right   . CATARACT EXTRACTION W/PHACO Left 03/10/2017   Procedure: CATARACT EXTRACTION PHACO AND INTRAOCULAR LENS PLACEMENT (IOC);  Surgeon: Birder Robson, MD;   Location: [facility];  Service: Ophthalmology;  Laterality: Left;  [place] 00:51.9 AP% 14.2 CDE 7.39 fluid pack lot # [id number]  . CENTRAL LINE INSERTION Right 11/11/2019   Procedure: CENTRAL LINE INSERTION;  Surgeon: Katha Cabal, MD;  Location: [facility];  Service: Vascular;  Laterality: Right;  . CENTRAL LINE INSERTION  11/25/2019   Procedure: CENTRAL LINE INSERTION;  Surgeon: Katha Cabal, MD;  Location: [facility];  Service: Vascular;;  . CHOLECYSTECTOMY    . COLONOSCOPY WITH PROPOFOL N/A 08/12/2016   Procedure: COLONOSCOPY WITH PROPOFOL;  Surgeon: Lollie Sails, MD;  Location: [facility] ENDOSCOPY;  Service: Endoscopy;  Laterality: N/A;  . DIALYSIS FISTULA CREATION Left    upper arm  . dialysis grafts    . DIALYSIS/PERMA CATHETER INSERTION N/A 11/14/2019   Procedure: DIALYSIS/PERMA CATHETER INSERTION;  Surgeon: Algernon Huxley, MD;  Location: [place];  Service: Cardiovascular;  Laterality: N/A;  . DIALYSIS/PERMA CATHETER INSERTION N/A 02/03/2020   Procedure: DIALYSIS/PERMA CATHETER INSERTION;  Surgeon: Katha Cabal, MD;  Location: [place] [facility];  Service: Cardiovascular;  Laterality: N/A;  . DIALYSIS/PERMA CATHETER INSERTION N/A 06/19/2020   Procedure: DIALYSIS/PERMA CATHETER INSERTION;  Surgeon: Katha Cabal, MD;  Location: [place];  Service: Cardiovascular;  Laterality: N/A;  . DIALYSIS/PERMA CATHETER REMOVAL N/A 05/25/2020   Procedure: DIALYSIS/PERMA CATHETER REMOVAL;  Surgeon: Katha Cabal, MD;  Location: [place];  Service: Cardiovascular;  Laterality: N/A;  . ESOPHAGOGASTRODUODENOSCOPY N/A 03/08/2015   Procedure: ESOPHAGOGASTRODUODENOSCOPY (EGD);  Surgeon: Manya Silvas, MD;  [facility];  Service: Endoscopy;  Laterality: N/A;  . ESOPHAGOGASTRODUODENOSCOPY (EGD) WITH PROPOFOL N/A 03/18/2016   Procedure: ESOPHAGOGASTRODUODENOSCOPY (EGD) WITH PROPOFOL;  Surgeon: [name], MD;  Location: [facility] ENDOSCOPY;  Service:  Endoscopy;  Laterality: N/A;  . EYE SURGERY Right 2018  . FECAL TRANSPLANT N/A 08/23/2015   Procedure: FECAL TRANSPLANT;  Surgeon: Manya Silvas, MD;  Location: [facility] ENDOSCOPY;  Service: Endoscopy;  Laterality: N/A;  . HAND SURGERY Bilateral   . HEMATOMA EVACUATION Left 11/25/2019   Procedure: EVACUATION HEMATOMA;  Surgeon: Katha Cabal, MD;  Location: [facility];  Service: Vascular;  Laterality: Left;  . I & D EXTREMITY Left 11/25/2019   Procedure: IRRIGATION AND DEBRIDEMENT EXTREMITY;  Surgeon: Katha Cabal, MD;  Location: [facility];  Service: Vascular;  Laterality: Left;  . IR FLUORO GUIDE CV LINE RIGHT  04/06/2020  . IR INJECT/THERA/INC NEEDLE/CATH/PLC EPI/CERV/THOR [facility]  08/13/2020  . IR RADIOLOGIST EVAL & MGMT  07/28/2019  . IR RADIOLOGIST EVAL & MGMT  08/11/2019  . LIGATION OF ARTERIOVENOUS  FISTULA  Left 11/11/2019  . LIGATION OF ARTERIOVENOUS  FISTULA Left 11/11/2019   Procedure: LIGATION OF ARTERIOVENOUS  FISTULA;  Surgeon: Katha Cabal, MD;  Location: [facility];  Service: Vascular;  Laterality: Left;  . LIGATIONS OF HERO GRAFT Right 06/13/2020   Procedure: LIGATION / REMOVAL OF RIGHT HERO GRAFT;  Surgeon: Katha Cabal, MD;  Location: [facility];  Service: Vascular;  Laterality: Right;  . PERIPHERAL VASCULAR CATHETERIZATION N/A 12/20/2015   Procedure: Thrombectomy of dialysis access versus permcath placement;  Surgeon: Algernon Huxley, MD;  Location: [place] [facility];  Service: Cardiovascular;  Laterality: N/A;  . PERIPHERAL VASCULAR CATHETERIZATION N/A 12/20/2015   Procedure: A/V Shunt Intervention;  Surgeon: Algernon Huxley, MD;  Location: [place] [facility];  Service: Cardiovascular;  Laterality: N/A;  . PERIPHERAL VASCULAR CATHETERIZATION N/A 12/20/2015   Procedure: A/V Shuntogram/Fistulagram;  Surgeon: Algernon Huxley, MD;  Location: [place] [facility];  Service: Cardiovascular;  Laterality: N/A;  . PERIPHERAL VASCULAR CATHETERIZATION N/A 01/02/2016   Procedure: A/V  Shuntogram/Fistulagram;  Surgeon: Algernon Huxley, MD;  Location: [place] [facility];  Service: Cardiovascular;  Laterality: N/A;  . PERIPHERAL VASCULAR CATHETERIZATION N/A 01/02/2016   Procedure: A/V Shunt Intervention;  Surgeon: Algernon Huxley, MD;  Location: [place] [facility];  Service: Cardiovascular;  Laterality: N/A;  . TEE WITHOUT CARDIOVERSION N/A 06/11/2020   Procedure: TRANSESOPHAGEAL ECHOCARDIOGRAM (TEE);  Surgeon: Teodoro Spray, MD;  Location: [facility];  Service: Cardiovascular;  Laterality: N/A;  . UPPER EXTREMITY ANGIOGRAPHY Bilateral 11/27/2020   Procedure: UPPER EXTREMITY ANGIOGRAPHY;  Surgeon: Katha Cabal, MD;  Location: [place] [facility];  Service: Cardiovascular;  Laterality: Bilateral;  . UPPER EXTREMITY VENOGRAPHY Right 01/18/2020   Procedure: UPPER EXTREMITY VENOGRAPHY;  Surgeon: Katha Cabal, MD;  Location: [place] [facility];  Service: Cardiovascular;  Laterality: Right;  . UPPER EXTREMITY VENOGRAPHY Bilateral 11/27/2020   Procedure: UPPER EXTREMITY VENOGRAPHY;  Surgeon: Katha Cabal, MD;  Location: [place] [facility];  Service: Cardiovascular;  Laterality: Bilateral;  . VASCULAR ACCESS DEVICE INSERTION Right 04/13/2020   Procedure: INSERTION OF HERO VASCULAR ACCESS DEVICE (GRAFT);  Surgeon: Katha Cabal, MD;  Location: [facility];  Service: Vascular;  Laterality: Right;    Prior to Admission medications   Medication Sig Start Date End Date Taking? Authorizing Provider  albuterol (PROVENTIL) (2.5 MG/3ML) 0.083% nebulizer solution Take 2.5 mg by nebulization every 4 (four) hours as needed for wheezing or shortness of breath.    [provider]  albuterol (VENTOLIN HFA) 108 (90 Base) MCG/ACT inhaler Inhale 2 puffs into the lungs every 4 (four) hours as needed for wheezing or shortness of breath. 11/09/19   [provider]  alum & mag hydroxide-simeth (MAALOX/MYLANTA) 200-200-20 MG/5ML suspension Take 15 mLs by mouth every 6 (six) hours  as needed for indigestion or heartburn. 06/25/20   Enzo Bi, MD  amLODipine (NORVASC) 10 MG tablet Take 1 tablet (10 mg total) by mouth daily. 06/25/20   Enzo Bi, MD  ammonium lactate (LAC-HYDRIN) 12 % lotion Apply 1 application topically 2 (two) times daily as needed for dry skin.     [provider]  Ascorbic Acid (VITA-C PO) Take 1 tablet by mouth daily.    [provider]  aspirin EC 81 MG tablet Take 81 mg by mouth daily.    [provider]  atorvastatin (LIPITOR) 20 MG tablet Take 20 mg by mouth every evening.  10/24/19   [provider]  calcium acetate (PHOSLO) 667 MG capsule Take by mouth. 07/16/20   [provider]  calcium carbonate (TUMS - DOSED IN MG ELEMENTAL CALCIUM) 500 MG chewable tablet Chew 2.5 tablets (500 mg of elemental calcium total) by mouth every 6 (six) hours as needed for indigestion. 06/25/20   Enzo Bi, MD  cefTAZidime Tressie Ellis) IVPB Inject 1 g into the vein every Monday, Wednesday, and Friday with hemodialysis. Indication: Thoracic vertebral osteomyelitis with disciits First Dose: Yes Last Day of Therapy:  10/10/20 Labs - Once weekly:  CBC/D, CMP, ESR, vancomycin trough   Method of administration may be changed at the discretion of home infusion pharmacist based upon assessment of the patient and/or caregiver's ability to self-administer the medication ordered. Patient taking differently: Inject 1 g into the vein Every Tuesday,Thursday,and Saturday with dialysis. Indication: Thoracic vertebral osteomyelitis with disciits First Dose: Yes Last Day of Therapy:  10/10/20 Labs - Once weekly:  CBC/D, CMP, ESR, vancomycin trough   Method of administration may be changed at the discretion of home infusion pharmacist based upon assessment of the patient and/or caregiver's ability to self-administer the medication ordered. 08/17/20   Wyvonnia Dusky, MD  Cholecalciferol (VITAMIN D3) 25 MCG (1000 UT) CAPS Take 1,000 Units by mouth  daily.     [provider]  cholestyramine (QUESTRAN) 4 g packet Take 4 g by mouth 2 (two) times daily. 07/26/20   [provider]  cholestyramine light (PREVALITE) 4 g packet Take 4 g by mouth 2 (two) times daily.    [provider]  CREON 12000 units CPEP capsule Take 36,000 Units by mouth 3 (three) times daily before meals.  10/21/19   [provider]  cyanocobalamin 1000 MCG tablet Take 1,000 mcg by mouth daily.     [provider]  diphenhydrAMINE (BENADRYL) 25 MG tablet Take 25 mg by mouth every 6 (six) hours as needed for itching.    [provider]  epoetin alfa (EPOGEN) 10000 UNIT/ML injection Inject 1 mL (10,000 Units total) into the vein every Monday, Wednesday, and Friday with hemodialysis. 06/27/20   Enzo Bi, MD  feeding supplement, ENSURE ENLIVE, (ENSURE ENLIVE) LIQD Take 237 mLs by mouth daily. 06/26/20   Enzo Bi, MD  Ferrous Sulfate (IRON) 325 (65 Fe) MG TABS Take 325 mg by mouth daily.     [provider]  furosemide (LASIX) 80 MG tablet Take 80 mg by mouth daily. 05/08/20   [provider]  furosemide (LASIX) 80 MG tablet Take 1 tablet by mouth daily. 08/13/20   [provider]  gabapentin (NEURONTIN) 100 MG capsule Take 100 mg by mouth every evening.     [provider]  hydrALAZINE (APRESOLINE) 50 MG tablet Take 1 tablet (50 mg total) by mouth every 8 (eight) hours. 06/25/20   Enzo Bi, MD  HYDROcodone-acetaminophen (NORCO/VICODIN) 5-325 MG tablet Take 1 tablet by mouth 3 (three) times daily as needed for moderate pain or severe pain. 07/26/20   [provider]  hydrOXYzine (ATARAX/VISTARIL) 25 MG tablet Take 25 mg by mouth daily.     [provider]  meclizine (ANTIVERT) 25 MG tablet Take 25 mg by mouth 4 (four) times daily as needed for dizziness.    [provider]  megestrol (MEGACE) 400 MG/10ML suspension Take 10 mLs (400 mg total) by mouth daily. 09/21/20    Shelly Coss, MD  multivitamin (RENA-VIT) TABS tablet Take 1 tablet by mouth daily.     [provider]  mupirocin ointment (  BACTROBAN) 2 % Apply 1 application topically daily as needed (leg rash).  11/09/19   [provider]  nitroGLYCERIN (NITROSTAT) 0.4 MG SL tablet Place 0.4 mg under the tongue every 5 (five) minutes as needed for chest pain.     [provider]  Omega-3 300 MG CAPS Take 300 mg by mouth 2 (two) times daily. Every other day opposite omega XL    [provider]  ondansetron (ZOFRAN) 4 MG tablet Take 1 tablet (4 mg total) by mouth every 8 (eight) hours as needed for nausea or vomiting. 09/21/20 09/21/21  Shelly Coss, MD  pantoprazole (PROTONIX) 40 MG tablet Take 40 mg by mouth daily. 08/28/19   [provider]  polyethylene glycol (MIRALAX / GLYCOLAX) 17 g packet Take 17 g by mouth daily. 06/25/20   Enzo Bi, MD  Probiotic Product (PROBIOTIC PO) Take 1 capsule by mouth daily.    [provider]  promethazine (PHENERGAN) 25 MG tablet Take 1 tablet (25 mg total) by mouth every 6 (six) hours as needed for nausea or vomiting. 06/25/20   Enzo Bi, MD  sevelamer carbonate (RENVELA) 800 MG tablet Take 800 mg by mouth 4 (four) times daily. (at meals and with a snack)    [provider]  vancomycin IVPB Inject 1,000 mg into the vein every Monday, Wednesday, and Friday with hemodialysis. Indication:  Thoracic vertebral osteomyelitis with disciits First Dose: Yes Last Day of Therapy:  10/10/20 Labs - Weekly:  CBC/D, CMP, vancomycin trough, ESR Method of administration:Elastomeric Method of administration may be changed at the discretion of the patient and/or caregiver's ability to self-administer the medication ordered. Patient taking differently: Inject 1,000 mg into the vein Every Tuesday,Thursday,and Saturday with dialysis. Indication:  Thoracic vertebral osteomyelitis with disciits First Dose: Yes Last Day of Therapy:   10/10/20 Labs - Weekly:  CBC/D, CMP, vancomycin trough, ESR Method of administration:Elastomeric Method of administration may be changed at the discretion of the patient and/or caregiver's ability to self-administer the medication ordered. 08/17/20   Wyvonnia Dusky, MD    Allergies Ace inhibitors, Ativan [lorazepam], Compazine [prochlorperazine edisylate], Sumatriptan succinate, Zofran [ondansetron], Losartan, Prochlorperazine, Reglan [metoclopramide], Scopolamine, Tape, Tapentadol, and Ultrasound gel  Family History  Problem Relation Age of Onset  . Kidney disease Mother   . Diabetes Mother   . Cancer Father   . Kidney disease Sister     Social History Social History   Tobacco Use  . Smoking status: Never Smoker  . Smokeless tobacco: Never Used  Vaping Use  . Vaping Use: Never used  Substance Use Topics  . Alcohol use: Not Currently    Comment: glass wine week per pt  . Drug use: Yes    Types: Marijuana    Comment: once a day    Review of Systems  Constitutional: Positive generalized weakness Eyes: No redness. ENT: No sore throat. Cardiovascular: Positive for chest pain. Respiratory: Positive for shortness of breath. Gastrointestinal: No vomiting or diarrhea.  Genitourinary: Negative for dysuria.  Musculoskeletal: Positive for back pain. Skin: Negative for rash. Neurological: Positive for headache.   ____________________________________________   PHYSICAL EXAM:  VITAL SIGNS: ED Triage Vitals [12/04/20 1559]  Enc Vitals Group     BP (!) 147/60     Pulse Rate (!) 52     Resp (!) 22     Temp 97.6 F (36.4 C)     Temp Source Oral     SpO2 100 %     Weight  Height 5' 5.5" (1.664 m)     Head Circumference      Peak Flow      Pain Score 10     Pain Loc      Pain Edu?      Excl. in [place]?     Constitutional: Alert and oriented.  Uncomfortable appearing and tearful. Eyes: Conjunctivae are normal.  Head: Atraumatic. Nose: No  congestion/rhinnorhea. Mouth/Throat: Mucous membranes are moist.   Neck: Normal range of motion.  Cardiovascular: Normal rate, regular rhythm. Grossly normal heart sounds.  Good peripheral circulation. Respiratory: Normal respiratory effort.  No retractions.  Slightly diminished breath sounds bilateral. Gastrointestinal: Soft with mild diffuse tenderness.  No distention.  Lower abdominal wall edema. Genitourinary: No flank tenderness. Musculoskeletal: 2+ bilateral lower extremity edema and venous stasis changes.  Extremities warm and well perfused.  Neurologic:  Normal speech and language. No gross focal neurologic deficits are appreciated.  Skin:  Skin is warm and dry. No rash noted. Psychiatric: Anxious appearing, tearful.  ____________________________________________   LABS (all labs ordered are listed, but only abnormal results are displayed)  Labs Reviewed  COMPREHENSIVE METABOLIC PANEL - Abnormal; Notable for the following components:      Result Value   Potassium 3.0 (*)    Chloride 93 (*)    CO2 37 (*)    Glucose, Bld 51 (*)    Creatinine, Ser 4.33 (*)    Calcium 8.1 (*)    Total Protein 6.4 (*)    Albumin 2.8 (*)    GFR, Estimated 11 (*)    All other components within normal limits  CBC WITH DIFFERENTIAL/PLATELET - Abnormal; Notable for the following components:   WBC 3.9 (*)    RBC 2.64 (*)    Hemoglobin 7.1 (*)    HCT 21.7 (*)    RDW 20.1 (*)    Platelets 93 (*)    Neutro Abs 1.5 (*)    Eosinophils Absolute 1.0 (*)    All other components within normal limits  BRAIN NATRIURETIC PEPTIDE - Abnormal; Notable for the following components:   B Natriuretic Peptide 756.4 (*)    All other components within normal limits  CBG MONITORING, ED - Abnormal; Notable for the following components:   Glucose-Capillary 100 (*)    All other components within normal limits  RESP PANEL BY RT-PCR (FLU A&B, COVID) ARPGX2  LACTIC ACID, PLASMA  LACTIC ACID, PLASMA  URINALYSIS,  COMPLETE (UACMP) WITH MICROSCOPIC  GLUCOSE, RANDOM  BASIC METABOLIC PANEL  CBC  SAMPLE TO BLOOD BANK  TROPONIN I (HIGH SENSITIVITY)  TROPONIN I (HIGH SENSITIVITY)   ____________________________________________  EKG  ED ECG REPORT I, Arta Silence, the attending physician, personally viewed and interpreted this ECG.  Date: 12/04/2020 EKG Time: 1623 Rate: 52 Rhythm: Sinus bradycardia (incorrectly read by machine as atrial fibrillation) QRS Axis: normal Intervals: Prolonged QTc ST/T Wave abnormalities: normal Narrative Interpretation: no evidence of acute ischemia  ____________________________________________  RADIOLOGY  Chest x-ray interpreted by me shows edema and bilateral effusions  ____________________________________________   PROCEDURES  Procedure(s) performed: No  Procedures  Critical Care performed: No ____________________________________________   INITIAL IMPRESSION / ASSESSMENT AND PLAN / ED COURSE  Pertinent labs & imaging results that were available during my care of the patient were reviewed by me and considered in my medical decision making (see chart for details).  59 year old female with PMH as noted above presents with generalized weakness, diffuse body pain, shortness of breath, and edema worsened over the last several days.  She missed her dialysis session 3 days ago.  She had dialysis with 6 L taken off today but then was too weak to be able to get to her car.  I reviewed the past medical records in Epic; the patient was most recently seen in the ED at the beginning of this month due to increased weakness.  Work-up was reassuring at that time and she was discharged home.  The patient is currently being seen by palliative care.  She was seen by vascular surgery last week and had angiography and right brachial angioplasty performed on 3/8.  On exam the patient is alert and oriented but tearful and anxious appearing.  Her vital signs are normal  except for borderline bradycardia.  O2 saturation is 100% on 2 L by nasal cannula.  The patient has diffuse chronic appearing edema.  Neurologic exam is nonfocal.  Her exam is otherwise as described above.  Overall presentation is consistent with acute on chronic fluid overload.  Although the patient had a large volume of fluid taken off at dialysis today, she still appears edematous.  She states that she does make some urine.  We will obtain a chest x-ray, lab work-up, give Lasix, analgesia, and reassess.  ----------------------------------------- 7:46 PM on 12/04/2020 -----------------------------------------  Chest x-ray and lab work-up confirm pulmonary edema as well as worsened anemia from baseline but no acute electrolyte abnormalities.  The patient has had no significant urine output with the IV Lasix.  She will need admission and the need interim additional dialysis.  I consulted Dr. Flossie Buffy from the hospitalist service for admission.  __________________________  [name] was evaluated in Emergency Department on 12/04/2020 for the symptoms described in the history of present illness. She was evaluated in the context of the global COVID-19 pandemic, which necessitated consideration that the patient might be at risk for infection with the SARS-CoV-2 virus that causes COVID-19. Institutional protocols and algorithms that pertain to the evaluation of patients at risk for COVID-19 are in a state of rapid change based on information released by regulatory bodies including the CDC and federal and state organizations. These policies and algorithms were followed during the patient's care in the ED.  ____________________________________________   FINAL CLINICAL IMPRESSION(S) / ED DIAGNOSES  Final diagnoses:  Anasarca  Chronic pulmonary edema  ESRD (end stage renal disease) ([place])      NEW MEDICATIONS STARTED DURING THIS VISIT:  New Prescriptions   No medications on file     Note:  This  document was prepared using Dragon voice recognition software and may include unintentional dictation errors.    Arta Silence, MD 12/04/20 [phone]

## 2020-12-04 NOTE — H&P (Addendum)
History and Physical    MARRIETTA THUNDER [name]:[id number] DOB: 1960-11-28 DOA: 12/04/2020  PCP: Denton Lank, MD  Patient coming from: Home, son at bedside  I have personally briefly reviewed patient's old medical records in [facility]  Chief Complaint: generalized pain, weakness, fluid   HPI: [name] is a 60 y.o. female with medical history significant for ESRD on HD [facility]/Thurs/Sat, DM, Chronic diastolic HF, CAD, PVD, recent T7-T8 discitis/osteomyelitis tx with 6 week course of IV vancomycin and ceftazidime ending on 10/10/20,  atherosclerotic occlusive disease b/l UE s/p right brachial artery angioplasty on 11/27/20 who presents with concerns of worsening abdominal and LE edema and pain.   Patient was drowsy and not a great historian. Son at bedside states that for the past month she began to have abdominal swelling that has progressively worsened.  She has chronic bilateral lymphedema and swelling of her lower extremities have also worsened this week.  She missed her dialysis session this past Saturday due to transportation issue.  Today she drove herself to dialysis and had 6 L pulled off.  However she then felt too weak to get back into her car afterwards.  She is currently complaining of pain in her abdomen, legs and just generally throughout her body.  Son also thinks she might have a "sore" on her back and does not think she can tolerate moving for an exam right now.   Pt was seen for generalized weakness and abdominal distention on 3/2 in the ED.  She was noted to have hypokalemia at that time but anemia and creatinine was stable.  Chest x-ray appeared to only have mild edema.  No abdominal imaging was done.  Patient was discharged home following a dose of pain medication.  ED Course: In ED, she was bradycardic down to the 50s but normotensive.  Mild leukopenia of 3.9, hemoglobin of 7.1 from a prior baseline of around 8-9.  Hypokalemia of 3, glucose of 51, creatinine of 4.33 with a baseline  around 4-5.  Normal LFTs. BNP of greater than 700.  Troponin flat at 8x2.  Chest x-ray showing cardiomegaly with pulmonary edema and small bilateral pleural effusions.  Review of Systems: Unable to fully obtain as patient was not a great historian and just continues to complain of generalized pain.  Past Medical History:  Diagnosis Date  . Anemia   . Anginal pain ([place])   . Anxiety   . Arthritis   . Asthma   . Broken wrist   . Bronchitis   . chronic diastolic CHF 9/47/6546  . Chronic kidney disease   . COPD (chronic obstructive pulmonary disease) ([place])   . Coronary artery disease    a. cath 2013: stenting to RCA (report not available); b. cath 2014: LM nl, pLAD 40%, mLAD nl, ost LCx 40%, mid LCx nl, pRCA 30% @ site of prior stent, mRCA 50%  . Depression   . Diabetes mellitus ([place])   . Diabetes mellitus without complication ([place])   . Diabetic neuropathy ([place])   . dialysis 2006  . Diverticulosis   . Dizziness   . Dyspnea   . Elevated lipids   . Environmental and seasonal allergies   . ESRD (end stage renal disease) on dialysis ([place])    M-W-F  . Gastroparesis   . GERD (gastroesophageal reflux disease)   . Headache   . History of anemia due to chronic kidney disease   . History of hiatal hernia   . HOH (hard of hearing)   .  Hx of pancreatitis 2015  . Hypertension   . Lower extremity edema   . Mitral regurgitation    a. echo 10/2013: EF 62%, noWMA, mildly dilated LA, mild to mod MR/TR, GR1DD  . Myocardial infarction ([place])   . Orthopnea   . Parathyroid abnormality ([place])   . Peripheral arterial disease ([place])   . Pneumonia   . Renal cancer ([place])   . Renal insufficiency    Pt is on dialysis on M,W + F.  . Wheezing     Past Surgical History:  Procedure Laterality Date  . A/V SHUNTOGRAM Left 01/20/2018   Procedure: A/V SHUNTOGRAM;  Surgeon: Algernon Huxley, MD;  Location: [place] [facility];  Service: Cardiovascular;  Laterality: Left;  . ABDOMINAL HYSTERECTOMY  1992  .  AMPUTATION TOE Left 10/02/2017   Procedure: AMPUTATION TOE-LEFT GREAT TOE;  Surgeon: Albertine Patricia, DPM;  Location: [facility];  Service: Podiatry;  Laterality: Left;  . APPENDECTOMY    . APPLICATION OF WOUND VAC N/A 11/25/2019   Procedure: APPLICATION OF WOUND VAC;  Surgeon: Katha Cabal, MD;  Location: [facility];  Service: Vascular;  Laterality: N/A;  . ARTERY BIOPSY Right 10/11/2018   Procedure: BIOPSY TEMPORAL ARTERY;  Surgeon: Vickie Epley, MD;  Location: [facility];  Service: General;  Laterality: Right;  . CARDIAC CATHETERIZATION Left 07/26/2015   Procedure: Left Heart Cath and Coronary Angiography;  Surgeon: Dionisio David, MD;  Location: [place] [facility];  Service: Cardiovascular;  Laterality: Left;  . CATARACT EXTRACTION W/ INTRAOCULAR LENS IMPLANT Right   . CATARACT EXTRACTION W/PHACO Left 03/10/2017   Procedure: CATARACT EXTRACTION PHACO AND INTRAOCULAR LENS PLACEMENT (IOC);  Surgeon: Birder Robson, MD;  Location: [facility];  Service: Ophthalmology;  Laterality: Left;  [place] 00:51.9 AP% 14.2 CDE 7.39 fluid pack lot # [id number]  . CENTRAL LINE INSERTION Right 11/11/2019   Procedure: CENTRAL LINE INSERTION;  Surgeon: Katha Cabal, MD;  Location: [facility];  Service: Vascular;  Laterality: Right;  . CENTRAL LINE INSERTION  11/25/2019   Procedure: CENTRAL LINE INSERTION;  Surgeon: Katha Cabal, MD;  Location: [facility];  Service: Vascular;;  . CHOLECYSTECTOMY    . COLONOSCOPY WITH PROPOFOL N/A 08/12/2016   Procedure: COLONOSCOPY WITH PROPOFOL;  Surgeon: Lollie Sails, MD;  Location: [facility] ENDOSCOPY;  Service: Endoscopy;  Laterality: N/A;  . DIALYSIS FISTULA CREATION Left    upper arm  . dialysis grafts    . DIALYSIS/PERMA CATHETER INSERTION N/A 11/14/2019   Procedure: DIALYSIS/PERMA CATHETER INSERTION;  Surgeon: Algernon Huxley, MD;  Location: [place] [facility];  Service: Cardiovascular;  Laterality: N/A;  . DIALYSIS/PERMA CATHETER INSERTION N/A 02/03/2020    Procedure: DIALYSIS/PERMA CATHETER INSERTION;  Surgeon: Katha Cabal, MD;  Location: [place] [facility];  Service: Cardiovascular;  Laterality: N/A;  . DIALYSIS/PERMA CATHETER INSERTION N/A 06/19/2020   Procedure: DIALYSIS/PERMA CATHETER INSERTION;  Surgeon: Katha Cabal, MD;  Location: [place] [facility];  Service: Cardiovascular;  Laterality: N/A;  . DIALYSIS/PERMA CATHETER REMOVAL N/A 05/25/2020   Procedure: DIALYSIS/PERMA CATHETER REMOVAL;  Surgeon: Katha Cabal, MD;  Location: [place] [facility];  Service: Cardiovascular;  Laterality: N/A;  . ESOPHAGOGASTRODUODENOSCOPY N/A 03/08/2015   Procedure: ESOPHAGOGASTRODUODENOSCOPY (EGD);  Surgeon: Manya Silvas, MD;  Location[facility];  Service: Endoscopy;  Laterality: N/A;  . ESOPHAGOGASTRODUODENOSCOPY (EGD) WITH PROPOFOL N/A 03/18/2016   Procedure: ESOPHAGOGASTRODUODENOSCOPY (EGD) WITH PROPOFOL;  Surgeon: [name], MD;  Location: [facility] ENDOSCOPY;  Service: Endoscopy;  Laterality: N/A;  .  EYE SURGERY Right 2018  . FECAL TRANSPLANT N/A 08/23/2015   Procedure: FECAL TRANSPLANT;  Surgeon: Manya Silvas, MD;  Location: [facility] ENDOSCOPY;  Service: Endoscopy;  Laterality: N/A;  . HAND SURGERY Bilateral   . HEMATOMA EVACUATION Left 11/25/2019   Procedure: EVACUATION HEMATOMA;  Surgeon: Katha Cabal, MD;  Location: [facility];  Service: Vascular;  Laterality: Left;  . I & D EXTREMITY Left 11/25/2019   Procedure: IRRIGATION AND DEBRIDEMENT EXTREMITY;  Surgeon: Katha Cabal, MD;  Location: [facility];  Service: Vascular;  Laterality: Left;  . IR FLUORO GUIDE CV LINE RIGHT  04/06/2020  . IR INJECT/THERA/INC NEEDLE/CATH/PLC EPI/CERV/THOR [facility]  08/13/2020  . IR RADIOLOGIST EVAL & MGMT  07/28/2019  . IR RADIOLOGIST EVAL & MGMT  08/11/2019  . LIGATION OF ARTERIOVENOUS  FISTULA Left 11/11/2019  . LIGATION OF ARTERIOVENOUS  FISTULA Left 11/11/2019   Procedure: LIGATION OF ARTERIOVENOUS  FISTULA;  Surgeon: Katha Cabal, MD;   Location: [facility];  Service: Vascular;  Laterality: Left;  . LIGATIONS OF HERO GRAFT Right 06/13/2020   Procedure: LIGATION / REMOVAL OF RIGHT HERO GRAFT;  Surgeon: Katha Cabal, MD;  Location: [facility];  Service: Vascular;  Laterality: Right;  . PERIPHERAL VASCULAR CATHETERIZATION N/A 12/20/2015   Procedure: Thrombectomy of dialysis access versus permcath placement;  Surgeon: Algernon Huxley, MD;  Location: [place] [facility];  Service: Cardiovascular;  Laterality: N/A;  . PERIPHERAL VASCULAR CATHETERIZATION N/A 12/20/2015   Procedure: A/V Shunt Intervention;  Surgeon: Algernon Huxley, MD;  Location: [place] [facility];  Service: Cardiovascular;  Laterality: N/A;  . PERIPHERAL VASCULAR CATHETERIZATION N/A 12/20/2015   Procedure: A/V Shuntogram/Fistulagram;  Surgeon: Algernon Huxley, MD;  Location: [place] [facility];  Service: Cardiovascular;  Laterality: N/A;  . PERIPHERAL VASCULAR CATHETERIZATION N/A 01/02/2016   Procedure: A/V Shuntogram/Fistulagram;  Surgeon: Algernon Huxley, MD;  Location: [place] [facility];  Service: Cardiovascular;  Laterality: N/A;  . PERIPHERAL VASCULAR CATHETERIZATION N/A 01/02/2016   Procedure: A/V Shunt Intervention;  Surgeon: Algernon Huxley, MD;  Location: [place] [facility];  Service: Cardiovascular;  Laterality: N/A;  . TEE WITHOUT CARDIOVERSION N/A 06/11/2020   Procedure: TRANSESOPHAGEAL ECHOCARDIOGRAM (TEE);  Surgeon: Teodoro Spray, MD;  Location: [facility];  Service: Cardiovascular;  Laterality: N/A;  . UPPER EXTREMITY ANGIOGRAPHY Bilateral 11/27/2020   Procedure: UPPER EXTREMITY ANGIOGRAPHY;  Surgeon: Katha Cabal, MD;  Location: [place] [facility];  Service: Cardiovascular;  Laterality: Bilateral;  . UPPER EXTREMITY VENOGRAPHY Right 01/18/2020   Procedure: UPPER EXTREMITY VENOGRAPHY;  Surgeon: Katha Cabal, MD;  Location: [place] [facility];  Service: Cardiovascular;  Laterality: Right;  . UPPER EXTREMITY VENOGRAPHY Bilateral 11/27/2020   Procedure: UPPER  EXTREMITY VENOGRAPHY;  Surgeon: Katha Cabal, MD;  Location: [place] [facility];  Service: Cardiovascular;  Laterality: Bilateral;  . VASCULAR ACCESS DEVICE INSERTION Right 04/13/2020   Procedure: INSERTION OF HERO VASCULAR ACCESS DEVICE (GRAFT);  Surgeon: Katha Cabal, MD;  Location: [facility];  Service: Vascular;  Laterality: Right;     reports that she has never smoked. She has never used smokeless tobacco. She reports previous alcohol use. She reports current drug use. Drug: Marijuana. Social History  Allergies  Allergen Reactions  . Ace Inhibitors Swelling and Anaphylaxis  . Ativan [Lorazepam] Other (See Comments)    Reaction:Hallucinations and headaches  . Compazine [Prochlorperazine Edisylate] Anaphylaxis, Nausea And Vomiting and Other (See Comments)    Other reaction(s): dystonia from this vs.  Reglan, 23 Jul - patient relates that she takes promethazine frequently with no problems  . Sumatriptan Succinate Other (See Comments)    Other reaction(s): delirium and hallucinations per [facility] records  . Zofran [Ondansetron] Nausea And Vomiting    Per pt. she is allergic to zofran or will experience adverse reaction like hallucination   . Losartan Nausea Only  . Prochlorperazine Other (See Comments)    Reaction:  Unknown . Patient does not remember reaction but she does have vertigo and anxiety along with n and v at times. Could be used to treat any of these   . Reglan [Metoclopramide] Other (See Comments)    Per patient her Dr. Evelina Bucy her off it   . Scopolamine Other (See Comments)    Dizziness, also has vertigo already  . Tape Rash    Plastic tape causes rash  . Tapentadol Rash  . Ultrasound Gel Itching    Patient states the Ultrasound gel caused itching while in skin contact and resolved when wiped away.    Family History  Problem Relation Age of Onset  . Kidney disease Mother   . Diabetes Mother   . Cancer Father   . Kidney disease Sister      Prior to  Admission medications   Medication Sig Start Date End Date Taking? Authorizing Provider  albuterol (PROVENTIL) (2.5 MG/3ML) 0.083% nebulizer solution Take 2.5 mg by nebulization every 4 (four) hours as needed for wheezing or shortness of breath.   Yes [provider]  albuterol (VENTOLIN HFA) 108 (90 Base) MCG/ACT inhaler Inhale 2 puffs into the lungs every 4 (four) hours as needed for wheezing or shortness of breath. 11/09/19  Yes [provider]  amLODipine (NORVASC) 10 MG tablet Take 1 tablet (10 mg total) by mouth daily. 06/25/20  Yes Enzo Bi, MD  ammonium lactate (LAC-HYDRIN) 12 % lotion Apply 1 application topically 2 (two) times daily as needed for dry skin.    Yes [provider]  Ascorbic Acid (VITA-C PO) Take 1 tablet by mouth daily.   Yes [provider]  aspirin EC 81 MG tablet Take 81 mg by mouth daily.   Yes [provider]  atorvastatin (LIPITOR) 20 MG tablet Take 20 mg by mouth every evening.  10/24/19  Yes [provider]  carvedilol (COREG) 6.25 MG tablet Take 6.25 mg by mouth 2 (two) times daily. 11/18/20  Yes [provider]  Cholecalciferol (VITAMIN D3) 25 MCG (1000 UT) CAPS Take 1,000 Units by mouth daily.    Yes [provider]  cholestyramine light (PREVALITE) 4 g packet Take 4 g by mouth 2 (two) times daily.   Yes [provider]  CREON 12000 units CPEP capsule Take 36,000 Units by mouth 3 (three) times daily before meals.  10/21/19  Yes [provider]  cyanocobalamin 1000 MCG tablet Take 1,000 mcg by mouth daily.    Yes [provider]  Ferrous Sulfate (IRON) 325 (65 Fe) MG TABS Take 325 mg by mouth daily.    Yes [provider]  furosemide (LASIX) 80 MG tablet Take 1 tablet by mouth daily. 08/13/20  Yes [provider]  gabapentin (NEURONTIN) 100 MG capsule Take 100 mg by mouth every evening.    Yes [provider]  hydrALAZINE (APRESOLINE) 50 MG  tablet Take 1 tablet (50 mg total) by mouth every 8 (eight) hours. 06/25/20  Yes Enzo Bi, MD  HYDROcodone-acetaminophen (NORCO/VICODIN) 5-325 MG tablet Take 1 tablet by mouth 3 (three)  times daily as needed for moderate pain or severe pain. 07/26/20  Yes [provider]  hydrOXYzine (ATARAX/VISTARIL) 25 MG tablet Take 25 mg by mouth daily.    Yes [provider]  megestrol (MEGACE) 400 MG/10ML suspension Take 10 mLs (400 mg total) by mouth daily. 09/21/20  Yes Shelly Coss, MD  multivitamin (RENA-VIT) TABS tablet Take 1 tablet by mouth daily.    Yes [provider]  mupirocin ointment (BACTROBAN) 2 % Apply 1 application topically daily as needed (leg rash).  11/09/19  Yes [provider]  Omega-3 300 MG CAPS Take 300 mg by mouth 2 (two) times daily. Every other day opposite omega XL   Yes [provider]  pantoprazole (PROTONIX) 40 MG tablet Take 40 mg by mouth daily. 08/28/19  Yes [provider]  Probiotic Product (PROBIOTIC PO) Take 1 capsule by mouth daily.   Yes [provider]  sevelamer carbonate (RENVELA) 800 MG tablet Take 800 mg by mouth 4 (four) times daily. (at meals and with a snack)   Yes [provider]  alum & mag hydroxide-simeth (MAALOX/MYLANTA) 200-200-20 MG/5ML suspension Take 15 mLs by mouth every 6 (six) hours as needed for indigestion or heartburn. 06/25/20   Enzo Bi, MD  calcium acetate (PHOSLO) 667 MG capsule Take by mouth. Patient not taking: No sig reported 07/16/20   [provider]  calcium carbonate (TUMS - DOSED IN MG ELEMENTAL CALCIUM) 500 MG chewable tablet Chew 2.5 tablets (500 mg of elemental calcium total) by mouth every 6 (six) hours as needed for indigestion. 06/25/20   Enzo Bi, MD  cefTAZidime Tressie Ellis) IVPB Inject 1 g into the vein every Monday, Wednesday, and Friday with hemodialysis. Indication: Thoracic vertebral osteomyelitis with disciits First Dose: Yes Last Day of Therapy:   10/10/20 Labs - Once weekly:  CBC/D, CMP, ESR, vancomycin trough   Method of administration may be changed at the discretion of home infusion pharmacist based upon assessment of the patient and/or caregiver's ability to self-administer the medication ordered. Patient taking differently: Inject 1 g into the vein Every Tuesday,Thursday,and Saturday with dialysis. Indication: Thoracic vertebral osteomyelitis with disciits First Dose: Yes Last Day of Therapy:  10/10/20 Labs - Once weekly:  CBC/D, CMP, ESR, vancomycin trough   Method of administration may be changed at the discretion of home infusion pharmacist based upon assessment of the patient and/or caregiver's ability to self-administer the medication ordered. 08/17/20   Wyvonnia Dusky, MD  diphenhydrAMINE (BENADRYL) 25 MG tablet Take 25 mg by mouth every 6 (six) hours as needed for itching.    [provider]  epoetin alfa (EPOGEN) 10000 UNIT/ML injection Inject 1 mL (10,000 Units total) into the vein every Monday, Wednesday, and Friday with hemodialysis. 06/27/20   Enzo Bi, MD  feeding supplement, ENSURE ENLIVE, (ENSURE ENLIVE) LIQD Take 237 mLs by mouth daily. 06/26/20   Enzo Bi, MD  HYDROcodone-acetaminophen (NORCO) 7.5-325 MG tablet Take 1 tablet by mouth 3 (three) times daily as needed for severe pain. Patient not taking: No sig reported 11/19/20   [provider]  meclizine (ANTIVERT) 25 MG tablet Take 25 mg by mouth 4 (four) times daily as needed for dizziness.    [provider]  nitroGLYCERIN (NITROSTAT) 0.4 MG SL tablet Place 0.4 mg under the tongue every 5 (five) minutes as needed for chest pain.     [provider]  ondansetron (ZOFRAN) 4 MG tablet Take 1 tablet (4 mg total) by mouth every 8 (eight)  hours as needed for nausea or vomiting. 09/21/20 09/21/21  Shelly Coss, MD  polyethylene glycol (MIRALAX / GLYCOLAX) 17 g packet Take 17 g by mouth daily. 06/25/20   Enzo Bi, MD  promethazine  (PHENERGAN) 25 MG tablet Take 1 tablet (25 mg total) by mouth every 6 (six) hours as needed for nausea or vomiting. 06/25/20   Enzo Bi, MD  vancomycin IVPB Inject 1,000 mg into the vein every Monday, Wednesday, and Friday with hemodialysis. Indication:  Thoracic vertebral osteomyelitis with disciits First Dose: Yes Last Day of Therapy:  10/10/20 Labs - Weekly:  CBC/D, CMP, vancomycin trough, ESR Method of administration:Elastomeric Method of administration may be changed at the discretion of the patient and/or caregiver's ability to self-administer the medication ordered. Patient taking differently: Inject 1,000 mg into the vein Every Tuesday,Thursday,and Saturday with dialysis. Indication:  Thoracic vertebral osteomyelitis with disciits First Dose: Yes Last Day of Therapy:  10/10/20 Labs - Weekly:  CBC/D, CMP, vancomycin trough, ESR Method of administration:Elastomeric Method of administration may be changed at the discretion of the patient and/or caregiver's ability to self-administer the medication ordered. 08/17/20   Wyvonnia Dusky, MD    Physical Exam: Vitals:   12/04/20 1700 12/04/20 1745 12/04/20 1800 12/04/20 1900  BP: (!) 146/82 132/60 (!) 136/39 113/73  Pulse: (!) 50 (!) 51 (!) 50 (!) 50  Resp: (!) 21 20  17   Temp:  97.6 F (36.4 C)    TempSrc:  Oral    SpO2: 100% 100% 100% 100%  Height:        Constitutional: Ill-appearing female laying flat in bed and mostly has her eyes closed and repeatedly falls asleep during exam Vitals:   12/04/20 1700 12/04/20 1745 12/04/20 1800 12/04/20 1900  BP: (!) 146/82 132/60 (!) 136/39 113/73  Pulse: (!) 50 (!) 51 (!) 50 (!) 50  Resp: (!) 21 20  17   Temp:  97.6 F (36.4 C)    TempSrc:  Oral    SpO2: 100% 100% 100% 100%  Height:       Eyes: PERRL, lids and conjunctivae normal ENMT: Mucous membranes are moist.  Neck: normal, supple Respiratory: clear to auscultation bilaterally, no wheezing, no crackles. Normal respiratory effort.  No accessory muscle use.  Cardiovascular: Sinus bradycardia on telemetry, no murmurs / rubs / gallops.  Significant bilateral lower extremity +3 circumferential edema with chronic venous stasis up to bilateral knees-significant tenderness with just mild palpation.  Changes of the skin left IJ dialysis catheter in place. Abdomen: Anasarca of the abdomen, significant tenderness with light palpation but no rebound tenderness or rigidity. Musculoskeletal: no clubbing / cyanosis. No joint deformity upper and lower extremities. Good ROM, no contractures. Normal muscle tone.  Skin: no rashes, lesions, ulcers. No induration Neurologic: CN 2-12 grossly intact. Sensation intact, Strength 5/5 in all 4.  Psychiatric: Normal judgment and insight. Alert and oriented x 3.  Tearful and frustrated mood.   Labs on Admission: I have personally reviewed following labs and imaging studies  CBC: Recent Labs  Lab 12/04/20 1601  WBC 3.9*  NEUTROABS 1.5*  HGB 7.1*  HCT 21.7*  MCV 82.2  PLT 93*   Basic Metabolic Panel: Recent Labs  Lab 12/04/20 1601  NA 141  K 3.0*  CL 93*  CO2 37*  GLUCOSE 46*  51*  BUN 14  CREATININE 4.33*  CALCIUM 8.1*   GFR: Estimated Creatinine Clearance: 14.2 mL/min (A) (by C-G formula based on SCr of 4.33 mg/dL (H)). Liver Function Tests: Recent  Labs  Lab 12/04/20 1601  AST 22  ALT <5  ALKPHOS 69  BILITOT 0.6  PROT 6.4*  ALBUMIN 2.8*   No results for input(s): LIPASE, AMYLASE in the last 168 hours. No results for input(s): AMMONIA in the last 168 hours. Coagulation Profile: No results for input(s): INR, PROTIME in the last 168 hours. Cardiac Enzymes: No results for input(s): CKTOTAL, CKMB, CKMBINDEX, TROPONINI in the last 168 hours. BNP (last 3 results) No results for input(s): PROBNP in the last 8760 hours. HbA1C: No results for input(s): HGBA1C in the last 72 hours. CBG: Recent Labs  Lab 12/04/20 1832 12/04/20 2013  GLUCAP 100* 71   Lipid Profile: No  results for input(s): CHOL, HDL, LDLCALC, TRIG, CHOLHDL, LDLDIRECT in the last 72 hours. Thyroid Function Tests: No results for input(s): TSH, T4TOTAL, FREET4, T3FREE, THYROIDAB in the last 72 hours. Anemia Panel: No results for input(s): VITAMINB12, FOLATE, FERRITIN, TIBC, IRON, RETICCTPCT in the last 72 hours. Urine analysis:    Component Value Date/Time   COLORURINE YELLOW (A) 09/17/2020 1124   APPEARANCEUR CLOUDY (A) 09/17/2020 1124   APPEARANCEUR Clear 09/15/2014 0947   LABSPEC 1.011 09/17/2020 1124   LABSPEC 1.008 09/15/2014 0947   PHURINE 7.0 09/17/2020 1124   GLUCOSEU NEGATIVE 09/17/2020 1124   GLUCOSEU 150 mg/dL 09/15/2014 0947   HGBUR NEGATIVE 09/17/2020 [place] 09/17/2020 1124   BILIRUBINUR Negative 09/15/2014 [place] 09/17/2020 1124   PROTEINUR >=300 (A) 09/17/2020 1124   UROBILINOGEN 0.2 06/30/2010 2130   NITRITE NEGATIVE 09/17/2020 1124   LEUKOCYTESUR NEGATIVE 09/17/2020 1124   LEUKOCYTESUR Negative 09/15/2014 0947    Radiological Exams on Admission: DG Chest Portable 1 View  Result Date: 12/04/2020 CLINICAL DATA:  Shortness of breath. EXAM: PORTABLE CHEST 1 VIEW COMPARISON:  11/21/2020 FINDINGS: 1642 hours. The cardio pericardial silhouette is enlarged. Vascular congestion diffuse interstitial opacity is compatible with edema. Probable airspace edema in the lower lungs with small bilateral pleural effusions. Left IJ dialysis catheter tip is in the lower right atrium. Subclavian vascular stent device evident. IMPRESSION: Cardiomegaly with pulmonary edema and small bilateral pleural effusions. Electronically Signed   By: Misty Stanley M.D.   On: 12/04/2020 16:53      Assessment/Plan  Anasarca/lower extremity edema in the setting of ESRD on HD Tues/Thurs/Sat Patient had over 6 L of fluid pulled in dialysis today but remains fluid overloaded.  No overt signs of heart failure although suspect her diastolic heart failure contributes  some to her fluid overload. Patient received IV Lasix 80 mg in the ED but patient does not have routine urine output. Will continue IV Lasix but will need nephrology consult in the morning since she likely will need off schedule repeat dialysis. strict Is and Os Check abdominal U/S for any drainable ascites although suspect this is more body wall edema Pt is followed by palliative care outpatient  Chronic diastolic heart failure Does not appear to be in acute exacerbation but significantly hypervolemic on exam.  Hypoglycemia w/hx of Type 2 diabetes BG of 40 on arrival. Pt did not eat today. Improved with Amp D50 CBG q2hr  Hypoglycemia protocol   Hypokalemia replete with oral supplementation follow with repeat lab   Anemia of chronic disease Hgb 7.1-could be dilutional from edema baseline around high 8-9  Normally receives Retacrit during HD  Threshold transfusion Hgb <7 no active bleeding  Asymptomatic bradycardia Continue to monitor on telemetry Has hx of this before Hold Coreg for now  HTN Continue amlodipine and hydroxyzine   DVT prophylaxis:.sq heparin Code Status: DNR Family Communication: Plan discussed with patient and son at bedside  disposition Plan: Home with observation Consults called:  Admission status: Observation  Level of care: Progressive Cardiac  Status is: Observation  The patient remains OBS appropriate and will d/c before 2 midnights.  Dispo: The patient is from: Home              Anticipated d/c is to: Home              Patient currently is not medically stable to d/c.   Difficult to place patient No         Orene Desanctis DO [facility]   If 7PM-7AM, please contact night-coverage [email or web address]   12/04/2020, 8:35 PM

## 2020-12-04 NOTE — ED Notes (Signed)
ED Provider at bedside. 

## 2020-12-04 NOTE — ED Triage Notes (Addendum)
Pt to ER from dialysis via [facility] after having 6L total fluid removed with little improvement in edema today. Pt missed dialysis on Saturday due to weakness and difficulty driving to center. Pt reports shortness of breath and weakness.   Ems VS- HR 50 sinus brady on EKG, cbg 79, O2 sats 100% on 2L [place], rales present on auscultation, last BP 147/65.

## 2020-12-04 NOTE — ED Notes (Signed)
Pt provided with hot chocolate per request.

## 2020-12-05 DIAGNOSIS — K3184 Gastroparesis: Secondary | ICD-10-CM | POA: Diagnosis present

## 2020-12-05 DIAGNOSIS — E1143 Type 2 diabetes mellitus with diabetic autonomic (poly)neuropathy: Secondary | ICD-10-CM | POA: Diagnosis present

## 2020-12-05 DIAGNOSIS — R001 Bradycardia, unspecified: Secondary | ICD-10-CM | POA: Diagnosis present

## 2020-12-05 DIAGNOSIS — N186 End stage renal disease: Secondary | ICD-10-CM | POA: Diagnosis present

## 2020-12-05 DIAGNOSIS — E669 Obesity, unspecified: Secondary | ICD-10-CM | POA: Diagnosis present

## 2020-12-05 DIAGNOSIS — E11649 Type 2 diabetes mellitus with hypoglycemia without coma: Secondary | ICD-10-CM | POA: Diagnosis present

## 2020-12-05 DIAGNOSIS — I2510 Atherosclerotic heart disease of native coronary artery without angina pectoris: Secondary | ICD-10-CM | POA: Diagnosis present

## 2020-12-05 DIAGNOSIS — Z6832 Body mass index (BMI) 32.0-32.9, adult: Secondary | ICD-10-CM | POA: Diagnosis not present

## 2020-12-05 DIAGNOSIS — F4323 Adjustment disorder with mixed anxiety and depressed mood: Secondary | ICD-10-CM | POA: Diagnosis present

## 2020-12-05 DIAGNOSIS — R52 Pain, unspecified: Secondary | ICD-10-CM | POA: Diagnosis present

## 2020-12-05 DIAGNOSIS — Z885 Allergy status to narcotic agent status: Secondary | ICD-10-CM | POA: Diagnosis not present

## 2020-12-05 DIAGNOSIS — E8770 Fluid overload, unspecified: Secondary | ICD-10-CM | POA: Diagnosis not present

## 2020-12-05 DIAGNOSIS — D631 Anemia in chronic kidney disease: Secondary | ICD-10-CM | POA: Diagnosis present

## 2020-12-05 DIAGNOSIS — J9601 Acute respiratory failure with hypoxia: Secondary | ICD-10-CM | POA: Diagnosis present

## 2020-12-05 DIAGNOSIS — Z91048 Other nonmedicinal substance allergy status: Secondary | ICD-10-CM | POA: Diagnosis not present

## 2020-12-05 DIAGNOSIS — E1151 Type 2 diabetes mellitus with diabetic peripheral angiopathy without gangrene: Secondary | ICD-10-CM | POA: Diagnosis present

## 2020-12-05 DIAGNOSIS — Z992 Dependence on renal dialysis: Secondary | ICD-10-CM | POA: Diagnosis not present

## 2020-12-05 DIAGNOSIS — D72819 Decreased white blood cell count, unspecified: Secondary | ICD-10-CM | POA: Diagnosis present

## 2020-12-05 DIAGNOSIS — Z66 Do not resuscitate: Secondary | ICD-10-CM | POA: Diagnosis present

## 2020-12-05 DIAGNOSIS — Z7189 Other specified counseling: Secondary | ICD-10-CM | POA: Diagnosis not present

## 2020-12-05 DIAGNOSIS — I340 Nonrheumatic mitral (valve) insufficiency: Secondary | ICD-10-CM | POA: Diagnosis present

## 2020-12-05 DIAGNOSIS — I5032 Chronic diastolic (congestive) heart failure: Secondary | ICD-10-CM | POA: Diagnosis present

## 2020-12-05 DIAGNOSIS — I132 Hypertensive heart and chronic kidney disease with heart failure and with stage 5 chronic kidney disease, or end stage renal disease: Secondary | ICD-10-CM | POA: Diagnosis present

## 2020-12-05 DIAGNOSIS — D649 Anemia, unspecified: Secondary | ICD-10-CM | POA: Diagnosis not present

## 2020-12-05 DIAGNOSIS — E876 Hypokalemia: Secondary | ICD-10-CM | POA: Diagnosis present

## 2020-12-05 DIAGNOSIS — E1122 Type 2 diabetes mellitus with diabetic chronic kidney disease: Secondary | ICD-10-CM | POA: Diagnosis present

## 2020-12-05 DIAGNOSIS — Z515 Encounter for palliative care: Secondary | ICD-10-CM | POA: Diagnosis not present

## 2020-12-05 DIAGNOSIS — Z20822 Contact with and (suspected) exposure to covid-19: Secondary | ICD-10-CM | POA: Diagnosis present

## 2020-12-05 DIAGNOSIS — Z888 Allergy status to other drugs, medicaments and biological substances status: Secondary | ICD-10-CM | POA: Diagnosis not present

## 2020-12-05 LAB — BASIC METABOLIC PANEL
Anion gap: 11 (ref 5–15)
BUN: 15 mg/dL (ref 8–23)
CO2: 37 mmol/L — ABNORMAL HIGH (ref 22–32)
Calcium: 7.9 mg/dL — ABNORMAL LOW (ref 8.9–10.3)
Chloride: 93 mmol/L — ABNORMAL LOW (ref 98–111)
Creatinine, Ser: 4.71 mg/dL — ABNORMAL HIGH (ref 0.44–1.00)
GFR, Estimated: 10 mL/min — ABNORMAL LOW (ref >60)
Glucose, Bld: 93 mg/dL (ref 70–99)
Potassium: 3.4 mmol/L — ABNORMAL LOW (ref 3.5–5.1)
Sodium: 141 mmol/L (ref 135–145)

## 2020-12-05 LAB — CBC
HCT: 19.6 % — ABNORMAL LOW (ref 36.0–46.0)
Hemoglobin: 6.6 g/dL — ABNORMAL LOW (ref 12.0–15.0)
MCH: 27.2 pg (ref 26.0–34.0)
MCHC: 33.7 g/dL (ref 30.0–36.0)
MCV: 80.7 fL (ref 80.0–100.0)
Platelets: 82 10*3/uL — ABNORMAL LOW (ref 150–400)
RBC: 2.43 MIL/uL — ABNORMAL LOW (ref 3.87–5.11)
RDW: 19.9 % — ABNORMAL HIGH (ref 11.5–15.5)
WBC: 4 10*3/uL (ref 4.0–10.5)
nRBC: 0 % (ref 0.0–0.2)

## 2020-12-05 LAB — CBG MONITORING, ED
Glucose-Capillary: 103 mg/dL — ABNORMAL HIGH (ref 70–99)
Glucose-Capillary: 89 mg/dL (ref 70–99)
Glucose-Capillary: 90 mg/dL (ref 70–99)
Glucose-Capillary: 94 mg/dL (ref 70–99)
Glucose-Capillary: 98 mg/dL (ref 70–99)

## 2020-12-05 LAB — GLUCOSE, CAPILLARY
Glucose-Capillary: 83 mg/dL (ref 70–99)
Glucose-Capillary: 110 mg/dL — ABNORMAL HIGH (ref 70–99)
Glucose-Capillary: 65 mg/dL — ABNORMAL LOW (ref 70–99)
Glucose-Capillary: 131 mg/dL — ABNORMAL HIGH (ref 70–99)
Glucose-Capillary: 99 mg/dL (ref 70–99)

## 2020-12-05 LAB — MRSA PCR SCREENING: MRSA by PCR: NEGATIVE

## 2020-12-05 LAB — TSH: TSH: 5.708 u[IU]/mL — ABNORMAL HIGH (ref 0.350–4.500)

## 2020-12-05 LAB — PREPARE RBC (CROSSMATCH)

## 2020-12-05 LAB — MAGNESIUM: Magnesium: 1.8 mg/dL (ref 1.7–2.4)

## 2020-12-05 MED ORDER — CHLORHEXIDINE GLUCONATE CLOTH 2 % EX PADS
6.0000 | MEDICATED_PAD | Freq: Every day | CUTANEOUS | Status: DC
  Administered 2020-12-06 – 2020-12-07 (×2): 6 via TOPICAL

## 2020-12-05 MED ORDER — SODIUM CHLORIDE 0.9% IV SOLUTION
Freq: Once | INTRAVENOUS | Status: AC
  Filled 2020-12-05: qty 250

## 2020-12-05 MED ORDER — ALBUMIN HUMAN 25 % IV SOLN
25.0000 g | Freq: Once | INTRAVENOUS | Status: AC
  Administered 2020-12-06: 25 g via INTRAVENOUS
  Filled 2020-12-05: qty 100

## 2020-12-05 MED ORDER — CHLORHEXIDINE GLUCONATE CLOTH 2 % EX PADS
6.0000 | MEDICATED_PAD | Freq: Every day | CUTANEOUS | Status: DC
  Administered 2020-12-05 – 2020-12-16 (×9): 6 via TOPICAL

## 2020-12-05 MED ORDER — EPOETIN ALFA 10000 UNIT/ML IJ SOLN
4000.0000 [IU] | INTRAMUSCULAR | Status: DC
  Administered 2020-12-06 – 2020-12-08 (×2): 4000 [IU] via INTRAVENOUS

## 2020-12-05 NOTE — ED Notes (Signed)
Notified RN Delilah Shan that patient has a bed assignment

## 2020-12-05 NOTE — Progress Notes (Signed)
[place] [facility] [place] NAME: [name]    MR#:  [id number]  DATE OF BIRTH:  1961-02-07  SUBJECTIVE:  CHIEF COMPLAINT:   Chief Complaint  Patient presents with  . Weakness  Lethargic, tired, sob, anasarca. c/o b/l knee pains REVIEW OF SYSTEMS:  ROSunable to obtain due to her mental status DRUG ALLERGIES:   Allergies  Allergen Reactions  . Ace Inhibitors Swelling and Anaphylaxis  . Ativan [Lorazepam] Other (See Comments)    Reaction:Hallucinations and headaches  . Compazine [Prochlorperazine Edisylate] Anaphylaxis, Nausea And Vomiting and Other (See Comments)    Other reaction(s): dystonia from this vs. Reglan, 23 Jul - patient relates that she takes promethazine frequently with no problems  . Sumatriptan Succinate Other (See Comments)    Other reaction(s): delirium and hallucinations per [facility] records  . Zofran [Ondansetron] Nausea And Vomiting    Per pt. she is allergic to zofran or will experience adverse reaction like hallucination   . Losartan Nausea Only  . Prochlorperazine Other (See Comments)    Reaction:  Unknown . Patient does not remember reaction but she does have vertigo and anxiety along with n and v at times. Could be used to treat any of these   . Reglan [Metoclopramide] Other (See Comments)    Per patient her Dr. Evelina Bucy her off it   . Scopolamine Other (See Comments)    Dizziness, also has vertigo already  . Tape Rash    Plastic tape causes rash  . Tapentadol Rash  . Ultrasound Gel Itching    Patient states the Ultrasound gel caused itching while in skin contact and resolved when wiped away.   VITALS:  Blood pressure (!) 149/65, pulse (!) 54, temperature 97.8 F (36.6 C), resp. rate 18, height 5\' 5"  (1.651 m), weight 84.3 kg, SpO2 100 %. PHYSICAL EXAMINATION:  Physical Exam  60 year old female looking critically sick. Looks older than her chronological age.  Has anasarca Lungs decreased breath sounds bilaterally rales  present Cardiovascular S1-S2 normal Extremities 3+ pedal edema bilateral lower extremities.  Significant tenderness in bilateral knees just with touching. Skin: No rash Neurology: She is drowsy/lethargic.  Nonfocal exam Psychiatry: Seems frustrated and tearful with her health and condition Access: Left IJ permacath LABORATORY PANEL:  Female CBC Recent Labs  Lab 12/05/20 0447  WBC 4.0  HGB 6.6*  HCT 19.6*  PLT 82*   ------------------------------------------------------------------------------------------------------------------ Chemistries  Recent Labs  Lab 12/04/20 1601 12/05/20 0447  NA 141 141  K 3.0* 3.4*  CL 93* 93*  CO2 37* 37*  GLUCOSE 46*  51* 93  BUN 14 15  CREATININE 4.33* 4.71*  CALCIUM 8.1* 7.9*  MG  --  1.8  AST 22  --   ALT <5  --   ALKPHOS 69  --   BILITOT 0.6  --    RADIOLOGY:  [place] ASCITES (ABDOMEN LIMITED)  Result Date: 12/04/2020 CLINICAL DATA:  Ascites EXAM: LIMITED ABDOMEN ULTRASOUND FOR ASCITES TECHNIQUE: Limited ultrasound survey for ascites was performed in all four abdominal quadrants. COMPARISON:  10/29/2020 FINDINGS: Sonographic evaluation of the 4 quadrants of the abdomen are obtained. There is trace ascites within the bilateral upper quadrant surrounding the spleen and liver. Insufficient volume for paracentesis. IMPRESSION: 1. Trace ascites within the bilateral upper quadrants. Volume is insufficient for paracentesis. Electronically Signed   By: Randa Ngo M.D.   On: 12/04/2020 22:29   ASSESSMENT AND PLAN:  [name] is  a 60 y.o. female with medical history significant for ESRD on HD Tu/Thurs/Sat, DM, Chronic diastolic HF, CAD, PVD, recent T7-T8 discitis/osteomyelitis tx with 6 week course of IV vancomycin and ceftazidime ending on 10/10/20,  atherosclerotic occlusive disease b/l UE s/p right brachial artery angioplasty on 11/27/20 who presents with concerns of worsening abdominal and LE edema and pain  Anasarca/lower extremity edema in the  setting of ESRD on HD Tues/Thurs/Sat Patient had over 6 L of fluid pulled in dialysis as an outpatient but remains fluid overloaded.   Nephrology consulted for dialysis need while in the hospital Abdominal ultrasound not showing any drainable ascites  Chronic diastolic heart failure Does not appear to be in acute exacerbation but significantly hypervolemic on exam.  Hypoglycemia w/hx of Type 2 diabetes BG of 40 on arrival.  Sugar improved now.  Close monitoring  Hypokalemia Replete and recheck  Anemia of chronic kidney disease Hgb 7.1>6.6 Will transfuse 1 PRBC today No signs of any obvious bleeding  Asymptomatic bradycardia Continue to monitor on telemetry Has hx of this before Hold Coreg for now  HTN Continue amlodipine and hydroxyzine  Multiple readmissions and overall poor prognosis Patient has had 4 readmissions in last 6 months per epic record Pt is followed by palliative care outpatient -we will consult them here for further evaluation and goals of care discussion.  Obesity Body mass index is 30.94 kg/m.  Net IO Since Admission: 992 mL [12/05/20 1709]      Status is: Inpatient  Remains inpatient appropriate because:Hemodynamically unstable, IV treatments appropriate due to intensity of illness or inability to take PO and Inpatient level of care appropriate due to severity of illness   Dispo: The patient is from: Home              Anticipated d/c is to: Home              Patient currently is not medically stable to d/c.   Difficult to place patient No   DVT prophylaxis:       heparin injection 5,000 Units Start: 12/04/20 2200     Family Communication:  "discussed with patient")   All the records are reviewed and case discussed with Care Management/Social Worker. Management plans discussed with the patient, family and they are in agreement.  CODE STATUS: DNR Level of care: Progressive Cardiac  TOTAL TIME TAKING CARE OF THIS PATIENT: 35  minutes.   More than 50% of the time was spent in counseling/coordination of care: YES  POSSIBLE D/C IN 2-3 DAYS, DEPENDING ON CLINICAL CONDITION.   Max Sane M.D on 12/05/2020 at 5:09 PM  [facility]   CC: Primary care physician; Denton Lank, MD  Note: This dictation was prepared with Dragon dictation along with smaller phrase technology. Any transcriptional errors that result from this process are unintentional.

## 2020-12-05 NOTE — Progress Notes (Signed)
[facility], [place] 12/05/20  Subjective:   LOS: 0 Patient known to our practice from outpatient dialysis and multiple previous admissions She came in feeling tired and weak.  She reported that large amount of fluid was removed with dialysis because of significant edema Dialysis treatment on March 15.  Patient had gained 7.4 kg.  Mistreatment on March 12.  6 kg was removed.  Post blood pressure 147/67. Today patient is tearful and reports pain in her legs and abdomen She states that "she is tired of all this"     Objective:  Vital signs in last 24 hours:  Temp:  [97.6 F (36.4 C)-98.7 F (37.1 C)] 98.4 F (36.9 C) (03/16 1102) Pulse Rate:  [50-65] 54 (03/16 1102) Resp:  [15-22] 18 (03/16 0840) BP: (113-154)/(39-89) 133/61 (03/16 1102) SpO2:  [92 %-100 %] 92 % (03/16 1102) Weight:  [84.3 kg] 84.3 kg (03/16 0840)  Weight change:  Filed Weights   12/05/20 0840  Weight: 84.3 kg    Intake/Output:    Intake/Output Summary (Last 24 hours) at 12/05/2020 1250 Last data filed at 12/05/2020 1044 Gross per 24 hour  Intake 580 ml  Output --  Net 580 ml     Physical Exam: General:  Chronically ill-appearing, lying in the bed  HEENT  moist oral mucous membranes  Pulm/lungs  normal breathing effort, Alpha o2  CVS/Heart  no rub  Abdomen:   Soft, mild diffuse tenderness, distended  Extremities:  2-3+ pitting edema up to lower abdomen  Neurologic:  Alert, tearful  Skin:  Chronic edema changes over both legs  Access:  Left IJ PermCath       Basic Metabolic Panel:  Recent Labs  Lab 12/04/20 1601 12/05/20 0447  NA 141 141  K 3.0* 3.4*  CL 93* 93*  CO2 37* 37*  GLUCOSE 46*  51* 93  BUN 14 15  CREATININE 4.33* 4.71*  CALCIUM 8.1* 7.9*  MG  --  1.8     CBC: Recent Labs  Lab 12/04/20 1601 12/05/20 0447  WBC 3.9* 4.0  NEUTROABS 1.5*  --   HGB 7.1* 6.6*  HCT 21.7* 19.6*  MCV 82.2 80.7  PLT 93* 82*      Lab Results  Component Value  Date   HEPBSAG NON REACTIVE 04/14/2020   HEPBIGM  06/12/2010    NEGATIVE (NOTE) High levels of Hepatitis B Core IgM antibody are detectable during the acute stage of Hepatitis B. This antibody is used to differentiate current from past HBV infection.       Microbiology:  Recent Results (from the past 240 hour(s))  Resp Panel by RT-PCR (Flu A&B, Covid) Nasopharyngeal Swab     Status: None   Collection Time: 12/04/20  4:55 PM   Specimen: Nasopharyngeal Swab; Nasopharyngeal(NP) swabs in vial transport medium  Result Value Ref Range Status   SARS Coronavirus 2 by RT PCR NEGATIVE NEGATIVE Final    Comment: (NOTE) SARS-CoV-2 target nucleic acids are NOT DETECTED.  The SARS-CoV-2 RNA is generally detectable in upper respiratory specimens during the acute phase of infection. The lowest concentration of SARS-CoV-2 viral copies this assay can detect is 138 copies/mL. A negative result does not preclude SARS-Cov-2 infection and should not be used as the sole basis for treatment or other patient management decisions. A negative result may occur with  improper specimen collection/handling, submission of specimen other than nasopharyngeal swab, presence of viral mutation(s) within the areas targeted by this assay, and inadequate number of  viral copies(<138 copies/mL). A negative result must be combined with clinical observations, patient history, and epidemiological information. The expected result is Negative.  Fact Sheet for Patients:  EntrepreneurPulse.com.au  Fact Sheet for Healthcare Providers:  IncredibleEmployment.be  This test is no t yet approved or cleared by the [place] FDA and  has been authorized for detection and/or diagnosis of SARS-CoV-2 by FDA under an Emergency Use Authorization (EUA). This EUA will remain  in effect (meaning this test can be used) for the duration of the COVID-19 declaration under Section 564(b)(1) of the Act,  21 U.S.C.section 360bbb-3(b)(1), unless the authorization is terminated  or revoked sooner.       Influenza A by PCR NEGATIVE NEGATIVE Final   Influenza B by PCR NEGATIVE NEGATIVE Final    Comment: (NOTE) The Xpert Xpress SARS-CoV-2/FLU/RSV plus assay is intended as an aid in the diagnosis of influenza from Nasopharyngeal swab specimens and should not be used as a sole basis for treatment. Nasal washings and aspirates are unacceptable for Xpert Xpress SARS-CoV-2/FLU/RSV testing.  Fact Sheet for Patients: EntrepreneurPulse.com.au  Fact Sheet for Healthcare Providers: IncredibleEmployment.be  This test is not yet approved or cleared by the [place] FDA and has been authorized for detection and/or diagnosis of SARS-CoV-2 by FDA under an Emergency Use Authorization (EUA). This EUA will remain in effect (meaning this test can be used) for the duration of the COVID-19 declaration under Section 564(b)(1) of the Act, 21 U.S.C. section 360bbb-3(b)(1), unless the authorization is terminated or revoked.  Performed at [facility], [address] [place], [place] [address]     Coagulation Studies: No results for input(s): LABPROT, INR in the last 72 hours.  Urinalysis: No results for input(s): COLORURINE, LABSPEC, PHURINE, GLUCOSEU, HGBUR, BILIRUBINUR, KETONESUR, PROTEINUR, UROBILINOGEN, NITRITE, LEUKOCYTESUR in the last 72 hours.  Invalid input(s): APPERANCEUR    Imaging: DG Chest Portable 1 View  Result Date: 12/04/2020 CLINICAL DATA:  Shortness of breath. EXAM: PORTABLE CHEST 1 VIEW COMPARISON:  11/21/2020 FINDINGS: 1642 hours. The cardio pericardial silhouette is enlarged. Vascular congestion diffuse interstitial opacity is compatible with edema. Probable airspace edema in the lower lungs with small bilateral pleural effusions. Left IJ dialysis catheter tip is in the lower right atrium. Subclavian vascular stent device evident.  IMPRESSION: Cardiomegaly with pulmonary edema and small bilateral pleural effusions. Electronically Signed   By: Misty Stanley M.D.   On: 12/04/2020 16:53   [place] ASCITES (ABDOMEN LIMITED)  Result Date: 12/04/2020 CLINICAL DATA:  Ascites EXAM: LIMITED ABDOMEN ULTRASOUND FOR ASCITES TECHNIQUE: Limited ultrasound survey for ascites was performed in all four abdominal quadrants. COMPARISON:  10/29/2020 FINDINGS: Sonographic evaluation of the 4 quadrants of the abdomen are obtained. There is trace ascites within the bilateral upper quadrant surrounding the spleen and liver. Insufficient volume for paracentesis. IMPRESSION: 1. Trace ascites within the bilateral upper quadrants. Volume is insufficient for paracentesis. Electronically Signed   By: Randa Ngo M.D.   On: 12/04/2020 22:29     Medications:    . amLODipine  10 mg Oral Daily  . aspirin EC  81 mg Oral Daily  . atorvastatin  20 mg Oral QPM  . Chlorhexidine Gluconate Cloth  6 each Topical Daily  . cholestyramine light  4 g Oral BID  . furosemide  80 mg Intravenous Daily  . gabapentin  100 mg Oral QPM  . heparin  5,000 Units Subcutaneous Q8H  . hydrALAZINE  50 mg Oral Q8H  . hydrOXYzine  25 mg Oral Daily  .  lipase/protease/amylase  36,000 Units Oral TID AC  . pantoprazole  40 mg Oral Daily  . sevelamer carbonate  800 mg Oral TID WC  . cyanocobalamin  1,000 mcg Oral Daily   calcium carbonate, HYDROcodone-acetaminophen, meclizine, sevelamer carbonate  Assessment/ Plan:  60 y.o. female with end-stage renal disease, on chronic diastolic CHF, diabetes type 2, thoracic discitis T7-8 completed treatment jan 2022 was admitted on 12/04/2020 for  Principal Problem:   Fluid overload Active Problems:   End-stage renal disease on hemodialysis (HCC)   HTN (hypertension)   Hypoglycemia associated with diabetes (HCC)   Hypokalemia   Sinus bradycardia   Anemia in ESRD (end-stage renal disease) (HCC)   Chronic diastolic CHF (congestive heart  failure) (HCC)  Chronic pulmonary edema [J81.1] Anasarca [R60.1] ESRD (end stage renal disease) ([place]) [N18.6] Fluid overload [E87.70] Ascites [R18.8]  [facility] dialysis/TTS 1/EDW 83.5 kg/left IJ PermCath/  #. ESRD with anasarca Patient had full dialysis treatment on March 15. Next hemodialysis treatment planned for Thursday She has anasarca.  Volume removal with HD as tolerated.  #. Anemia of CKD  Lab Results  Component Value Date   HGB 6.6 (L) 12/05/2020   Low dose EPO with HD. Blood transfusion is being considered  #. Secondary hyperparathyroidism of renal origin N 25.81      Component Value Date/Time   PTH 357 (H) 10/05/2018 1241   Lab Results  Component Value Date   PHOS 3.9 09/18/2020   PHOS 4.0 09/18/2020   Monitor calcium and phos level during this admission   #. Diabetes type 2 with CKD Hemoglobin A1C (%)  Date Value  07/19/2013 9.3 (H)   Hgb A1c MFr Bld (%)  Date Value  09/18/2020 4.1 (L)       LOS: 0 [name] 3/16/202212:50 PM  [facility] [place], [place]

## 2020-12-05 NOTE — ED Notes (Signed)
Hard copy of blood consent obtained and sent with patient to floor with all of belongings. E-consent not working.

## 2020-12-06 DIAGNOSIS — E8770 Fluid overload, unspecified: Secondary | ICD-10-CM | POA: Diagnosis not present

## 2020-12-06 DIAGNOSIS — I5032 Chronic diastolic (congestive) heart failure: Secondary | ICD-10-CM | POA: Diagnosis not present

## 2020-12-06 DIAGNOSIS — D631 Anemia in chronic kidney disease: Secondary | ICD-10-CM | POA: Diagnosis not present

## 2020-12-06 DIAGNOSIS — N186 End stage renal disease: Secondary | ICD-10-CM | POA: Diagnosis not present

## 2020-12-06 LAB — BASIC METABOLIC PANEL
Anion gap: 13 (ref 5–15)
BUN: 17 mg/dL (ref 8–23)
CO2: 38 mmol/L — ABNORMAL HIGH (ref 22–32)
Calcium: 8.1 mg/dL — ABNORMAL LOW (ref 8.9–10.3)
Chloride: 91 mmol/L — ABNORMAL LOW (ref 98–111)
Creatinine, Ser: 5.25 mg/dL — ABNORMAL HIGH (ref 0.44–1.00)
GFR, Estimated: 9 mL/min — ABNORMAL LOW (ref >60)
Glucose, Bld: 72 mg/dL (ref 70–99)
Potassium: 3.9 mmol/L (ref 3.5–5.1)
Sodium: 142 mmol/L (ref 135–145)

## 2020-12-06 LAB — HEPATITIS B SURFACE ANTIGEN: Hepatitis B Surface Ag: NONREACTIVE

## 2020-12-06 LAB — CBC
HCT: 22.9 % — ABNORMAL LOW (ref 36.0–46.0)
Hemoglobin: 7.9 g/dL — ABNORMAL LOW (ref 12.0–15.0)
MCH: 28.2 pg (ref 26.0–34.0)
MCHC: 34.5 g/dL (ref 30.0–36.0)
MCV: 81.8 fL (ref 80.0–100.0)
Platelets: 78 10*3/uL — ABNORMAL LOW (ref 150–400)
RBC: 2.8 MIL/uL — ABNORMAL LOW (ref 3.87–5.11)
RDW: 18.6 % — ABNORMAL HIGH (ref 11.5–15.5)
WBC: 5.1 10*3/uL (ref 4.0–10.5)
nRBC: 0 % (ref 0.0–0.2)

## 2020-12-06 LAB — TYPE AND SCREEN
ABO/RH(D): O POS
Antibody Screen: NEGATIVE
Unit division: 0

## 2020-12-06 LAB — BPAM RBC
Blood Product Expiration Date: 202204202359
ISSUE DATE / TIME: 202203161035
Unit Type and Rh: 5100

## 2020-12-06 MED ORDER — DIPHENHYDRAMINE HCL 50 MG/ML IJ SOLN
INTRAMUSCULAR | Status: AC
  Administered 2020-12-06: 25 mg via INTRAVENOUS
  Filled 2020-12-06: qty 1

## 2020-12-06 MED ORDER — HEPARIN SODIUM (PORCINE) 1000 UNIT/ML IJ SOLN
4600.0000 [IU] | Freq: Once | INTRAMUSCULAR | Status: AC
  Administered 2020-12-06: 4600 [IU]

## 2020-12-06 MED ORDER — ALBUMIN HUMAN 25 % IV SOLN
25.0000 g | Freq: Once | INTRAVENOUS | Status: AC
  Administered 2020-12-06: 25 g via INTRAVENOUS
  Filled 2020-12-06: qty 100

## 2020-12-06 MED ORDER — DIPHENHYDRAMINE HCL 50 MG/ML IJ SOLN
25.0000 mg | Freq: Four times a day (QID) | INTRAMUSCULAR | Status: DC | PRN
  Administered 2020-12-07 – 2020-12-15 (×5): 25 mg via INTRAVENOUS
  Filled 2020-12-06 (×4): qty 1

## 2020-12-06 NOTE — Plan of Care (Signed)
No acute distress this shift, VSS. See assessment for details.   Problem: Education: Goal: Knowledge of General Education information will improve Description: Including pain rating scale, medication(s)/side effects and non-pharmacologic comfort measures Outcome: Progressing   Problem: Health Behavior/Discharge Planning: Goal: Ability to manage health-related needs will improve Outcome: Progressing   Problem: Clinical Measurements: Goal: Ability to maintain clinical measurements within normal limits will improve Outcome: Progressing Goal: Will remain free from infection Outcome: Progressing Goal: Diagnostic test results will improve Outcome: Progressing Goal: Respiratory complications will improve Outcome: Progressing Goal: Cardiovascular complication will be avoided Outcome: Progressing   Problem: Activity: Goal: Risk for activity intolerance will decrease Outcome: Progressing   Problem: Nutrition: Goal: Adequate nutrition will be maintained Outcome: Progressing   Problem: Coping: Goal: Level of anxiety will decrease Outcome: Progressing   Problem: Elimination: Goal: Will not experience complications related to bowel motility Outcome: Progressing Goal: Will not experience complications related to urinary retention Outcome: Progressing   Problem: Pain Managment: Goal: General experience of comfort will improve Outcome: Progressing   Problem: Safety: Goal: Ability to remain free from injury will improve Outcome: Progressing   Problem: Skin Integrity: Goal: Risk for impaired skin integrity will decrease Outcome: Progressing

## 2020-12-06 NOTE — Progress Notes (Signed)
[name], [place] 12/06/20  Subjective:   LOS: 1 Patient known to our practice from outpatient dialysis and multiple previous admissions She came in feeling tired and weak.  She reported that large amount of fluid was removed with dialysis because of significant edema    HEMODIALYSIS FLOWSHEET:  Blood Flow Rate (mL/min): 0 mL/min Arterial Pressure (mmHg): -100 mmHg Venous Pressure (mmHg): 100 mmHg Transmembrane Pressure (mmHg): 70 mmHg Ultrafiltration Rate (mL/min): 990 mL/min Dialysate Flow Rate (mL/min): 600 ml/min Conductivity: Machine : 13.8 Conductivity: Machine : 13.8 Dialysis Fluid Bolus: Normal Saline Bolus Amount (mL): 0 mL Dialysate Change:  (3k bath as ordered)      Objective:  Vital signs in last 24 hours:  Temp:  [97.8 F (36.6 C)-98.3 F (36.8 C)] 98.1 F (36.7 C) (03/17 1054) Pulse Rate:  [48-62] 55 (03/17 1457) Resp:  [10-20] 10 (03/17 1245) BP: (104-142)/(48-89) 121/55 (03/17 1457) SpO2:  [91 %-100 %] 100 % (03/17 1515) Weight:  [88 kg] 88 kg (03/17 0424)  Weight change:  Filed Weights   12/05/20 0840 12/06/20 0424  Weight: 84.3 kg 88 kg    Intake/Output:    Intake/Output Summary (Last 24 hours) at 12/06/2020 1555 Last data filed at 12/06/2020 1454 Gross per 24 hour  Intake 360 ml  Output 3000 ml  Net -2640 ml     Physical Exam: General:  Chronically ill-appearing, lying in the bed  HEENT  moist oral mucous membranes  Pulm/lungs  normal breathing effort, [place] o2  CVS/Heart  no rub  Abdomen:   Soft, mild diffuse tenderness, distended  Extremities:  2-3+ pitting edema up to lower abdomen  Neurologic:  Alert, tearful  Skin:  Chronic edema changes over both legs  Access:  Left IJ PermCath       Basic Metabolic Panel:  Recent Labs  Lab 12/04/20 1601 12/05/20 0447 12/06/20 0530  NA 141 141 142  K 3.0* 3.4* 3.9  CL 93* 93* 91*  CO2 37* 37* 38*  GLUCOSE 46*  51* 93 72  BUN 14 15 17   CREATININE  4.33* 4.71* 5.25*  CALCIUM 8.1* 7.9* 8.1*  MG  --  1.8  --      CBC: Recent Labs  Lab 12/04/20 1601 12/05/20 0447 12/06/20 0530  WBC 3.9* 4.0 5.1  NEUTROABS 1.5*  --   --   HGB 7.1* 6.6* 7.9*  HCT 21.7* 19.6* 22.9*  MCV 82.2 80.7 81.8  PLT 93* 82* 78*      Lab Results  Component Value Date   HEPBSAG NON REACTIVE 04/14/2020   HEPBIGM  06/12/2010    NEGATIVE (NOTE) High levels of Hepatitis B Core IgM antibody are detectable during the acute stage of Hepatitis B. This antibody is used to differentiate current from past HBV infection.       Microbiology:  Recent Results (from the past 240 hour(s))  Resp Panel by RT-PCR (Flu A&B, Covid) Nasopharyngeal Swab     Status: None   Collection Time: 12/04/20  4:55 PM   Specimen: Nasopharyngeal Swab; Nasopharyngeal(NP) swabs in vial transport medium  Result Value Ref Range Status   SARS Coronavirus 2 by RT PCR NEGATIVE NEGATIVE Final    Comment: (NOTE) SARS-CoV-2 target nucleic acids are NOT DETECTED.  The SARS-CoV-2 RNA is generally detectable in upper respiratory specimens during the acute phase of infection. The lowest concentration of SARS-CoV-2 viral copies this assay can detect is 138 copies/mL. A negative result does not preclude SARS-Cov-2 infection and should not  be used as the sole basis for treatment or other patient management decisions. A negative result may occur with  improper specimen collection/handling, submission of specimen other than nasopharyngeal swab, presence of viral mutation(s) within the areas targeted by this assay, and inadequate number of viral copies(<138 copies/mL). A negative result must be combined with clinical observations, patient history, and epidemiological information. The expected result is Negative.  Fact Sheet for Patients:  EntrepreneurPulse.com.au  Fact Sheet for Healthcare Providers:  IncredibleEmployment.be  This test is no t yet approved  or cleared by the [place] FDA and  has been authorized for detection and/or diagnosis of SARS-CoV-2 by FDA under an Emergency Use Authorization (EUA). This EUA will remain  in effect (meaning this test can be used) for the duration of the COVID-19 declaration under Section 564(b)(1) of the Act, 21 U.S.C.section 360bbb-3(b)(1), unless the authorization is terminated  or revoked sooner.       Influenza A by PCR NEGATIVE NEGATIVE Final   Influenza B by PCR NEGATIVE NEGATIVE Final    Comment: (NOTE) The Xpert Xpress SARS-CoV-2/FLU/RSV plus assay is intended as an aid in the diagnosis of influenza from Nasopharyngeal swab specimens and should not be used as a sole basis for treatment. Nasal washings and aspirates are unacceptable for Xpert Xpress SARS-CoV-2/FLU/RSV testing.  Fact Sheet for Patients: EntrepreneurPulse.com.au  Fact Sheet for Healthcare Providers: IncredibleEmployment.be  This test is not yet approved or cleared by the [place] FDA and has been authorized for detection and/or diagnosis of SARS-CoV-2 by FDA under an Emergency Use Authorization (EUA). This EUA will remain in effect (meaning this test can be used) for the duration of the COVID-19 declaration under Section 564(b)(1) of the Act, 21 U.S.C. section 360bbb-3(b)(1), unless the authorization is terminated or revoked.  Performed at [facility], [address] [place], [place] [address]   MRSA PCR Screening     Status: None   Collection Time: 12/05/20 10:32 AM   Specimen: Nasopharyngeal  Result Value Ref Range Status   MRSA by PCR NEGATIVE NEGATIVE Final    Comment:        The GeneXpert MRSA Assay (FDA approved for NASAL specimens only), is one component of a comprehensive MRSA colonization surveillance program. It is not intended to diagnose MRSA infection nor to guide or monitor treatment for MRSA infections. Performed at [facility], [address] [place], [place] [address]     Coagulation Studies: No results for input(s): LABPROT, INR in the last 72 hours.  Urinalysis: No results for input(s): COLORURINE, LABSPEC, PHURINE, GLUCOSEU, HGBUR, BILIRUBINUR, KETONESUR, PROTEINUR, UROBILINOGEN, NITRITE, LEUKOCYTESUR in the last 72 hours.  Invalid input(s): APPERANCEUR    Imaging: DG Chest Portable 1 View  Result Date: 12/04/2020 CLINICAL DATA:  Shortness of breath. EXAM: PORTABLE CHEST 1 VIEW COMPARISON:  11/21/2020 FINDINGS: 1642 hours. The cardio pericardial silhouette is enlarged. Vascular congestion diffuse interstitial opacity is compatible with edema. Probable airspace edema in the lower lungs with small bilateral pleural effusions. Left IJ dialysis catheter tip is in the lower right atrium. Subclavian vascular stent device evident. IMPRESSION: Cardiomegaly with pulmonary edema and small bilateral pleural effusions. Electronically Signed   By: Misty Stanley M.D.   On: 12/04/2020 16:53   [place] ASCITES (ABDOMEN LIMITED)  Result Date: 12/04/2020 CLINICAL DATA:  Ascites EXAM: LIMITED ABDOMEN ULTRASOUND FOR ASCITES TECHNIQUE: Limited ultrasound survey for ascites was performed in all four abdominal quadrants. COMPARISON:  10/29/2020 FINDINGS: Sonographic evaluation of the 4 quadrants of the  abdomen are obtained. There is trace ascites within the bilateral upper quadrant surrounding the spleen and liver. Insufficient volume for paracentesis. IMPRESSION: 1. Trace ascites within the bilateral upper quadrants. Volume is insufficient for paracentesis. Electronically Signed   By: Randa Ngo M.D.   On: 12/04/2020 22:29     Medications:    . amLODipine  10 mg Oral Daily  . atorvastatin  20 mg Oral QPM  . Chlorhexidine Gluconate Cloth  6 each Topical Daily  . Chlorhexidine Gluconate Cloth  6 each Topical Q0600  . cholestyramine light  4 g Oral BID  . epoetin (EPOGEN/PROCRIT) injection  4,000 Units Intravenous Q  T,Th,Sa-HD  . furosemide  80 mg Intravenous Daily  . gabapentin  100 mg Oral QPM  . heparin  5,000 Units Subcutaneous Q8H  . hydrALAZINE  50 mg Oral Q8H  . hydrOXYzine  25 mg Oral Daily  . lipase/protease/amylase  36,000 Units Oral TID AC  . pantoprazole  40 mg Oral Daily  . sevelamer carbonate  800 mg Oral TID WC  . cyanocobalamin  1,000 mcg Oral Daily   calcium carbonate, HYDROcodone-acetaminophen, meclizine, sevelamer carbonate  Assessment/ Plan:  60 y.o. female with end-stage renal disease, on chronic diastolic CHF, diabetes type 2, thoracic discitis T7-8 completed treatment jan 2022 was admitted on 12/04/2020 for  Principal Problem:   Fluid overload Active Problems:   End-stage renal disease on hemodialysis (HCC)   HTN (hypertension)   Hypoglycemia associated with diabetes (HCC)   Hypokalemia   Sinus bradycardia   Severe anemia   Anemia in ESRD (end-stage renal disease) (HCC)   Chronic diastolic CHF (congestive heart failure) (HCC)  Chronic pulmonary edema [J81.1] Anasarca [R60.1] ESRD (end stage renal disease) ([place]) [N18.6] Fluid overload [E87.70] Ascites [R18.8] Severe anemia [D64.9]  [facility] dialysis/TTS 1/EDW 83.5 kg/left IJ PermCath/  #. ESRD with anasarca Patient seen during dialysis Tolerating well  Patient has irregular heart rhythm and is bradycardic at times Was poorly responsive earlier in the treatment likely due to pain medication being given prior to treatment.  By the end of treatment, she was awake and interactive.  #. Anemia of CKD  Lab Results  Component Value Date   HGB 7.9 (L) 12/06/2020   Low dose EPO with HD.    #. Secondary hyperparathyroidism of renal origin N 25.81      Component Value Date/Time   PTH 357 (H) 10/05/2018 1241   Lab Results  Component Value Date   PHOS 3.9 09/18/2020   PHOS 4.0 09/18/2020   Monitor calcium and phos level during this admission   #. Diabetes type 2 with CKD Hemoglobin A1C (%)  Date  Value  07/19/2013 9.3 (H)   Hgb A1c MFr Bld (%)  Date Value  09/18/2020 4.1 (L)       LOS: [place] 3/17/20223:55 [place], [place]

## 2020-12-06 NOTE — Progress Notes (Signed)
Consult received, chart reviewed. Attempted to see patient for [place] discussion. Patient at HD. Will reattempt later in day/tomorrow to discuss [place]ica.  Juel Burrow, DNP, AGNP-C Palliative Medicine Team Team Phone # [phone]  Pager # [phone]  NO CHARGE

## 2020-12-06 NOTE — Progress Notes (Signed)
[name] MRN: [id number] DOB: 1960-09-24   Cancelled Treatment:    Reason Eval/Treat Not Completed: Other (comment). Pt out of room at this time, PT to re-attempt as able.   Lieutenant Diego PT, DPT 11:18 AM,12/06/20

## 2020-12-06 NOTE — Progress Notes (Signed)
Inpatient Diabetes Program Recommendations  AACE/ADA: New Consensus Statement on Inpatient Glycemic Control   Target Ranges:  Prepandial:   less than 140 mg/dL      Peak postprandial:   less than 180 mg/dL (1-2 hours)      Critically ill patients:  140 - 180 mg/dL  Results for [name], [name] (MRN [id number]) as of 12/06/2020 07:42  Ref. Range 12/06/2020 05:30  Glucose Latest Ref Range: 70 - 99 mg/dL 72   Results for [name], [name] (MRN [id number]) as of 12/06/2020 07:42  Ref. Range 12/05/2020 06:34 12/05/2020 08:05 12/05/2020 11:55 12/05/2020 14:03 12/05/2020 16:22 12/05/2020 16:47 12/05/2020 20:20  Glucose-Capillary Latest Ref Range: 70 - 99 mg/dL 90 98 83 110 (H) 65 (L) 131 (H) 99  Results for [name], [name] (MRN [id number]) as of 12/06/2020 07:42  Ref. Range 08/11/2020 01:10 09/18/2020 15:06  Hemoglobin A1C Latest Ref Range: 4.8 - 5.6 % 3.9 (L) 4.1 (L)   Review of Glycemic Control  Diabetes history: DM2 Outpatient Diabetes medications: None Current orders for Inpatient glycemic control: None; CBG monitoring  Inpatient Diabetes Program Recommendations:    Hypoglycemia: Noted glucose trending low at times. Patient is not on any outpatient DM medications and has not been given any medications while inpatient that would drop glucose.   Noted patient has Ensure Enlive on home medication list. No [name] intake documented in chart so unsure if patient is eating well or not.  May want to consider ordering RD consult for recommendations on nutritional supplements.  Thanks, Barnie Alderman, RN, MSN, CDE Diabetes Coordinator Inpatient Diabetes Program [phone] (Team Pager from 8am to 5pm)

## 2020-12-06 NOTE — Progress Notes (Signed)
[place] [facility] [place] NAME: [name]    MR#:  [id number]  DATE OF BIRTH:  December 11, 1960  SUBJECTIVE:  CHIEF COMPLAINT:   Chief Complaint  Patient presents with  . Weakness  somewhat better than y'day. Rested well. HD planned for today, requesting to sit up in chair REVIEW OF SYSTEMS:  Review of Systems  Constitutional: Positive for malaise/fatigue. Negative for diaphoresis, fever and weight loss.  HENT: Negative for ear discharge, ear pain, hearing loss, nosebleeds, sore throat and tinnitus.   Eyes: Negative for blurred vision and pain.  Respiratory: Positive for shortness of breath. Negative for cough, hemoptysis and wheezing.   Cardiovascular: Positive for leg swelling. Negative for chest pain, palpitations and orthopnea.  Gastrointestinal: Negative for abdominal pain, blood in stool, constipation, diarrhea, heartburn, nausea and vomiting.  Genitourinary: Negative for dysuria, frequency and urgency.  Musculoskeletal: Positive for joint pain. Negative for back pain and myalgias.  Skin: Negative for itching and rash.  Neurological: Negative for dizziness, tingling, tremors, focal weakness, seizures, weakness and headaches.  Psychiatric/Behavioral: Negative for depression. The patient is not nervous/anxious.    DRUG ALLERGIES:   Allergies  Allergen Reactions  . Ace Inhibitors Swelling and Anaphylaxis  . Ativan [Lorazepam] Other (See Comments)    Reaction:Hallucinations and headaches  . Compazine [Prochlorperazine Edisylate] Anaphylaxis, Nausea And Vomiting and Other (See Comments)    Other reaction(s): dystonia from this vs. Reglan, 23 Jul - patient relates that she takes promethazine frequently with no problems  . Sumatriptan Succinate Other (See Comments)    Other reaction(s): delirium and hallucinations per [facility] records  . Zofran [Ondansetron] Nausea And Vomiting    Per pt. she is allergic to zofran or will experience adverse reaction like  hallucination   . Losartan Nausea Only  . Prochlorperazine Other (See Comments)    Reaction:  Unknown . Patient does not remember reaction but she does have vertigo and anxiety along with n and v at times. Could be used to treat any of these   . Reglan [Metoclopramide] Other (See Comments)    Per patient her Dr. Evelina Bucy her off it   . Scopolamine Other (See Comments)    Dizziness, also has vertigo already  . Tape Rash    Plastic tape causes rash  . Tapentadol Rash  . Ultrasound Gel Itching    Patient states the Ultrasound gel caused itching while in skin contact and resolved when wiped away.   VITALS:  Blood pressure (!) 127/56, pulse (!) 56, temperature 98.1 F (36.7 C), temperature source Oral, resp. rate 10, height 5\' 5"  (1.651 m), weight 88 kg, SpO2 100 %. PHYSICAL EXAMINATION:  Physical Exam  60 year old female looks older than her chronological age.  Has anasarca Lungs decreased breath sounds bilaterally rales present Cardiovascular S1-S2 normal Extremities 3+ pedal edema bilateral lower extremities.  Significant tenderness in bilateral knees. Skin: No rash Neurology: She is awake and alert.  Nonfocal exam Psychiatry: Seems frustrated and tearful with her health and condition Access: Left IJ permacath LABORATORY PANEL:  Female CBC Recent Labs  Lab 12/06/20 0530  WBC 5.1  HGB 7.9*  HCT 22.9*  PLT 78*   ------------------------------------------------------------------------------------------------------------------ Chemistries  Recent Labs  Lab 12/04/20 1601 12/05/20 0447 12/06/20 0530  NA 141 141 142  K 3.0* 3.4* 3.9  CL 93* 93* 91*  CO2 37* 37* 38*  GLUCOSE 46*  51* 93 72  BUN 14 15 17   CREATININE 4.33*  4.71* 5.25*  CALCIUM 8.1* 7.9* 8.1*  MG  --  1.8  --   AST 22  --   --   ALT <5  --   --   ALKPHOS 69  --   --   BILITOT 0.6  --   --    RADIOLOGY:  No results found. ASSESSMENT AND PLAN:  ADRI SCHLOSS is a 60 y.o. female with medical history  significant for ESRD on HD Tu/Thurs/Sat, DM, Chronic diastolic HF, CAD, PVD, recent T7-T8 discitis/osteomyelitis tx with 6 week course of IV vancomycin and ceftazidime ending on 10/10/20,  atherosclerotic occlusive disease b/l UE s/p right brachial artery angioplasty on 11/27/20 who presents with concerns of worsening abdominal and LE edema and pain  Anasarca/lower extremity edema in the setting of ESRD on HD Tues/Thurs/Sat HD today per nephro Abdominal ultrasound not showing any drainable ascites  Chronic diastolic heart failure Does not appear to be in acute exacerbation but significantly hypervolemic on exam.  Hypoglycemia w/hx of Type 2 diabetes BG of 40 on arrival.  Sugar improved now.  Close monitoring  Hypokalemia Repleted and resolved  Anemia of chronic kidney disease Hgb 7.1>6.6>7.9 S/p 1 PRBC transfusion No signs of any obvious bleeding  Asymptomatic bradycardia Continue to monitor on telemetry Has hx of this before Hold Coreg for now  HTN Continue amlodipine and hydroxyzine  Multiple readmissions and overall poor prognosis Patient has had 4 readmissions in last 6 months per epic record Pt is followed by palliative care outpatient - pending consult for further evaluation and goals of care discussion. PT, OT c/s  Obesity Body mass index is 32.28 kg/m.  Net IO Since Admission: 1,352 mL [12/06/20 1515]      Status is: Inpatient  Remains inpatient appropriate because:Hemodynamically unstable, IV treatments appropriate due to intensity of illness or inability to take PO and Inpatient level of care appropriate due to severity of illness   Dispo: The patient is from: Home              Anticipated d/c is to: Home              Patient currently is not medically stable to d/c.   Difficult to place patient No   DVT prophylaxis:       heparin injection 5,000 Units Start: 12/04/20 2200     Family Communication:  "discussed with patient"   All the records  are reviewed and case discussed with Care Management/Social Worker. Management plans discussed with the patient, Nursing and they are in agreement.  CODE STATUS: DNR Level of care: Progressive Cardiac  TOTAL TIME TAKING CARE OF THIS PATIENT: 35 minutes.   More than 50% of the time was spent in counseling/coordination of care: YES  POSSIBLE D/C IN 1-2 DAYS, DEPENDING ON CLINICAL CONDITION.   Max Sane M.D on 12/06/2020 at 3:15 PM  [facility]   CC: Primary care physician; Denton Lank, MD  Note: This dictation was prepared with Dragon dictation along with smaller phrase technology. Any transcriptional errors that result from this process are unintentional.

## 2020-12-06 NOTE — Progress Notes (Signed)
OT Cancellation Note  Patient Details Name: [name] MRN: [id number] DOB: [birth year]   Cancelled Treatment:    Reason Eval/Treat Not Completed: Patient at procedure or test/ unavailable . OT order received and chart reviewed. Pt currently at dialysis. OT to re-attempt at next available time.   Darleen Crocker, MS, OTR/L , CBIS ascom [phone]  12/06/20, 1:13 PM   12/06/2020, 1:12 PM

## 2020-12-06 NOTE — Progress Notes (Signed)
Hemodialysis patient known at [place] 10:45am. Patient transports self, with occasional help from son. Please contact me with any dialysis placement concerns.  Elvera Bicker Dialysis Coordinator [phone]

## 2020-12-07 DIAGNOSIS — J9601 Acute respiratory failure with hypoxia: Secondary | ICD-10-CM | POA: Diagnosis not present

## 2020-12-07 DIAGNOSIS — E8770 Fluid overload, unspecified: Secondary | ICD-10-CM | POA: Diagnosis not present

## 2020-12-07 DIAGNOSIS — Z7189 Other specified counseling: Secondary | ICD-10-CM | POA: Diagnosis not present

## 2020-12-07 DIAGNOSIS — D631 Anemia in chronic kidney disease: Secondary | ICD-10-CM | POA: Diagnosis not present

## 2020-12-07 DIAGNOSIS — I5032 Chronic diastolic (congestive) heart failure: Secondary | ICD-10-CM | POA: Diagnosis not present

## 2020-12-07 DIAGNOSIS — F4323 Adjustment disorder with mixed anxiety and depressed mood: Secondary | ICD-10-CM | POA: Diagnosis not present

## 2020-12-07 DIAGNOSIS — N186 End stage renal disease: Secondary | ICD-10-CM | POA: Diagnosis not present

## 2020-12-07 DIAGNOSIS — Z515 Encounter for palliative care: Secondary | ICD-10-CM

## 2020-12-07 DIAGNOSIS — Z66 Do not resuscitate: Secondary | ICD-10-CM

## 2020-12-07 LAB — BASIC METABOLIC PANEL WITH GFR
Anion gap: 10 (ref 5–15)
BUN: 10 mg/dL (ref 8–23)
CO2: 37 mmol/L — ABNORMAL HIGH (ref 22–32)
Calcium: 8.4 mg/dL — ABNORMAL LOW (ref 8.9–10.3)
Chloride: 93 mmol/L — ABNORMAL LOW (ref 98–111)
Creatinine, Ser: 4 mg/dL — ABNORMAL HIGH (ref 0.44–1.00)
GFR, Estimated: 12 mL/min — ABNORMAL LOW
Glucose, Bld: 71 mg/dL (ref 70–99)
Potassium: 3.8 mmol/L (ref 3.5–5.1)
Sodium: 140 mmol/L (ref 135–145)

## 2020-12-07 LAB — CBC
HCT: 23.6 % — ABNORMAL LOW (ref 36.0–46.0)
Hemoglobin: 7.9 g/dL — ABNORMAL LOW (ref 12.0–15.0)
MCH: 28 pg (ref 26.0–34.0)
MCHC: 33.5 g/dL (ref 30.0–36.0)
MCV: 83.7 fL (ref 80.0–100.0)
Platelets: 76 K/uL — ABNORMAL LOW (ref 150–400)
RBC: 2.82 MIL/uL — ABNORMAL LOW (ref 3.87–5.11)
RDW: 19.1 % — ABNORMAL HIGH (ref 11.5–15.5)
WBC: 4.8 K/uL (ref 4.0–10.5)
nRBC: 0 % (ref 0.0–0.2)

## 2020-12-07 LAB — HEPATITIS B DNA, ULTRAQUANTITATIVE, PCR
HBV DNA SERPL PCR-ACNC: NOT DETECTED IU/mL
HBV DNA SERPL PCR-LOG IU: UNDETERMINED log10 IU/mL

## 2020-12-07 LAB — HEMOGLOBIN A1C
Hgb A1c MFr Bld: 4.6 % — ABNORMAL LOW (ref 4.8–5.6)
Mean Plasma Glucose: 85.32 mg/dL

## 2020-12-07 LAB — HEPATITIS B SURFACE ANTIBODY, QUANTITATIVE: Hep B S AB Quant (Post): >1000 m[IU]/mL

## 2020-12-07 LAB — GLUCOSE, CAPILLARY
Glucose-Capillary: 75 mg/dL (ref 70–99)
Glucose-Capillary: 62 mg/dL — ABNORMAL LOW (ref 70–99)

## 2020-12-07 MED ORDER — INSULIN ASPART 100 UNIT/ML ~~LOC~~ SOLN: 0.0000 [IU] | Freq: Every day | SUBCUTANEOUS | Status: DC

## 2020-12-07 MED ORDER — INSULIN ASPART 100 UNIT/ML ~~LOC~~ SOLN
0.0000 [IU] | Freq: Three times a day (TID) | SUBCUTANEOUS | Status: DC
  Administered 2020-12-09 – 2020-12-12 (×3): 1 [IU] via SUBCUTANEOUS
  Filled 2020-12-07 (×4): qty 1

## 2020-12-07 MED ORDER — NAPHAZOLINE-GLYCERIN 0.012-0.2 % OP SOLN
1.0000 [drp] | Freq: Two times a day (BID) | OPHTHALMIC | Status: DC
  Administered 2020-12-07 – 2020-12-08 (×3): 2 [drp] via OPHTHALMIC
  Administered 2020-12-09 – 2020-12-11 (×4): 1 [drp] via OPHTHALMIC
  Administered 2020-12-11 – 2020-12-12 (×2): 2 [drp] via OPHTHALMIC
  Administered 2020-12-12 – 2020-12-13 (×2): 1 [drp] via OPHTHALMIC
  Administered 2020-12-13: 2 [drp] via OPHTHALMIC
  Administered 2020-12-14 – 2020-12-15 (×3): 1 [drp] via OPHTHALMIC
  Administered 2020-12-15: 2 [drp] via OPHTHALMIC
  Filled 2020-12-07: qty 15

## 2020-12-07 NOTE — Consult Note (Signed)
[facility] Consult   Reason for Consult: Consult for 60 year old woman currently in the hospital with renal disease excess fluid having missed dialysis multiple medical problems.  Concern about mood Referring Physician:  Manuella Ghazi Patient Identification: [name] MRN:  [id number] Principal Diagnosis: Adjustment disorder with mixed anxiety and depressed mood Diagnosis:  Principal Problem:   Adjustment disorder with mixed anxiety and depressed mood Active Problems:   End-stage renal disease on hemodialysis (HCC)   HTN (hypertension)   Hypoglycemia associated with diabetes ([place])   Hypokalemia   Sinus bradycardia   Severe anemia   Anemia in ESRD (end-stage renal disease) (HCC)   Fluid overload   Chronic diastolic CHF (congestive heart failure) ([place])   Total Time spent with patient: 1 hour  Subjective:   [name] is a 60 y.o. female patient admitted with "I remember you".  HPI: Patient seen chart reviewed.  Remarkably the patient immediately remembered me although we last spoke 5 years ago.  She had some family visiting her in the room but I was able to speak with her anyway.  Patient is in the hospital with symptoms of fluid overload having missed some dialysis.  Fatigue weight gain weakness swelling.  Consult was asked today because it was said that she appeared to be crying easily.  Patient was an interesting combination of defensive and engaging during the conversation.  She wanted to know immediately why a consult was requested and when it was explained to her she immediately jumped to the conclusion that they had called a consult because she complained about her room smelling bad earlier in the day.  I tried to reassure her that I am sure that the consult was just meant to be helpful to her.  When asked about mood symptoms she again becomes somewhat defensive and turns the tables insisting that of course she would be sad under her current circumstances and that there is  nothing wrong with crying.  Patient denies feeling that her mood is in any way off of her baseline.  Justifies every bit of behavior in terms of her own medical problems or what she sees as being the correct way for her care to be done.  She did not appear to be psychotic in any way.  No sign of thought disorder or true paranoia.  Although she was talkative she was not pressured not showing flight of ideas.  Affect was full range she was able to make jokes appropriately.  Denies any suicidal thoughts.  Denies having been on any psychiatric medicines recently.  Patient's memory is very clear as demonstrated by her ability to immediately remember me and to remember what we had talked about in 2017.  Seems to be generally cognitively intact.  Past Psychiatric History: This patient had been given a diagnosis of bipolar disorder at some point.  Not clear to me how correct that ever was.  I saw her in 2017 during an episode in which she was delirious when acutely sick.  She came out of that once her medical problems improved and it seemed to me at the time that most of her anxiety and depression and mental problems manifested when she was acutely ill.  No known history of suicide attempts.  Only psychosis I can identify happen when she was very acutely ill and probably delirious.  She had been on Prozac and low doses of olanzapine when I last saw her.  Does not seem to have been on psychiatric medicines  recently.  Risk to Self:   Risk to Others:   Prior Inpatient Therapy:   Prior Outpatient Therapy:    Past Medical History:  Past Medical History:  Diagnosis Date  . Anemia   . Anginal pain ([place])   . Anxiety   . Arthritis   . Asthma   . Broken wrist   . Bronchitis   . chronic diastolic CHF 5/00/3704  . Chronic kidney disease   . COPD (chronic obstructive pulmonary disease) ([place])   . Coronary artery disease    a. cath 2013: stenting to RCA (report not available); b. cath 2014: LM nl, pLAD 40%, mLAD nl,  ost LCx 40%, mid LCx nl, pRCA 30% @ site of prior stent, mRCA 50%  . Depression   . Diabetes mellitus ([place])   . Diabetes mellitus without complication ([place])   . Diabetic neuropathy ([place])   . dialysis 2006  . Diverticulosis   . Dizziness   . Dyspnea   . Elevated lipids   . Environmental and seasonal allergies   . ESRD (end stage renal disease) on dialysis ([place])    M-W-F  . Gastroparesis   . GERD (gastroesophageal reflux disease)   . Headache   . History of anemia due to chronic kidney disease   . History of hiatal hernia   . HOH (hard of hearing)   . Hx of pancreatitis 2015  . Hypertension   . Lower extremity edema   . Mitral regurgitation    a. echo 10/2013: EF 62%, noWMA, mildly dilated LA, mild to mod MR/TR, GR1DD  . Myocardial infarction ([place])   . Orthopnea   . Parathyroid abnormality ([place])   . Peripheral arterial disease ([place])   . Pneumonia   . Renal cancer ([place])   . Renal insufficiency    Pt is on dialysis on M,W + F.  . Wheezing     Past Surgical History:  Procedure Laterality Date  . A/V SHUNTOGRAM Left 01/20/2018   Procedure: A/V SHUNTOGRAM;  Surgeon: Algernon Huxley, MD;  Location: [facility];  Service: Cardiovascular;  Laterality: Left;  . ABDOMINAL HYSTERECTOMY  1992  . AMPUTATION TOE Left 10/02/2017   Procedure: AMPUTATION TOE-LEFT GREAT TOE;  Surgeon: Albertine Patricia, DPM;  Location: [facility];  Service: Podiatry;  Laterality: Left;  . APPENDECTOMY    . APPLICATION OF WOUND VAC N/A 11/25/2019   Procedure: APPLICATION OF WOUND VAC;  Surgeon: Katha Cabal, MD;  Location: [facility];  Service: Vascular;  Laterality: N/A;  . ARTERY BIOPSY Right 10/11/2018   Procedure: BIOPSY TEMPORAL ARTERY;  Surgeon: Vickie Epley, MD;  Location: [facility];  Service: General;  Laterality: Right;  . CARDIAC CATHETERIZATION Left 07/26/2015   Procedure: Left Heart Cath and Coronary Angiography;  Surgeon: Dionisio David, MD;  Location: [facility];  Service:  Cardiovascular;  Laterality: Left;  . CATARACT EXTRACTION W/ INTRAOCULAR LENS IMPLANT Right   . CATARACT EXTRACTION W/PHACO Left 03/10/2017   Procedure: CATARACT EXTRACTION PHACO AND INTRAOCULAR LENS PLACEMENT (IOC);  Surgeon: Birder Robson, MD;  Location: [facility];  Service: Ophthalmology;  Laterality: Left;  [place] 00:51.9 AP% 14.2 CDE 7.39 fluid pack lot # [id number]  . CENTRAL LINE INSERTION Right 11/11/2019   Procedure: CENTRAL LINE INSERTION;  Surgeon: Katha Cabal, MD;  Location: [facility];  Service: Vascular;  Laterality: Right;  . CENTRAL LINE INSERTION  11/25/2019   Procedure: CENTRAL LINE INSERTION;  Surgeon: Katha Cabal, MD;  Location: [facility];  Service: Vascular;;  . CHOLECYSTECTOMY    . COLONOSCOPY WITH PROPOFOL N/A 08/12/2016   Procedure: COLONOSCOPY WITH PROPOFOL;  Surgeon: Lollie Sails, MD;  Location: [facility];  Service: Endoscopy;  Laterality: N/A;  . DIALYSIS FISTULA CREATION Left    upper arm  . dialysis grafts    . DIALYSIS/PERMA CATHETER INSERTION N/A 11/14/2019   Procedure: DIALYSIS/PERMA CATHETER INSERTION;  Surgeon: Algernon Huxley, MD;  Location: [facility];  Service: Cardiovascular;  Laterality: N/A;  . DIALYSIS/PERMA CATHETER INSERTION N/A 02/03/2020   Procedure: DIALYSIS/PERMA CATHETER INSERTION;  Surgeon: Katha Cabal, MD;  Location: [facility];  Service: Cardiovascular;  Laterality: N/A;  . DIALYSIS/PERMA CATHETER INSERTION N/A 06/19/2020   Procedure: DIALYSIS/PERMA CATHETER INSERTION;  Surgeon: Katha Cabal, MD;  Location: [facility];  Service: Cardiovascular;  Laterality: N/A;  . DIALYSIS/PERMA CATHETER REMOVAL N/A 05/25/2020   Procedure: DIALYSIS/PERMA CATHETER REMOVAL;  Surgeon: Katha Cabal, MD;  Location: [facility];  Service: Cardiovascular;  Laterality: N/A;  . ESOPHAGOGASTRODUODENOSCOPY N/A 03/08/2015   Procedure: ESOPHAGOGASTRODUODENOSCOPY (EGD);  Surgeon: Manya Silvas, MD;   Location: [facility];  Service: Endoscopy;  Laterality: N/A;  . ESOPHAGOGASTRODUODENOSCOPY (EGD) WITH PROPOFOL N/A 03/18/2016   Procedure: ESOPHAGOGASTRODUODENOSCOPY (EGD) WITH PROPOFOL;  Surgeon: [name], MD;  Location: [facility];  Service: Endoscopy;  Laterality: N/A;  . EYE SURGERY Right 2018  . FECAL TRANSPLANT N/A 08/23/2015   Procedure: FECAL TRANSPLANT;  Surgeon: Manya Silvas, MD;  Location: [facility]COPY;  Service: Endoscopy;  Laterality: N/A;  . HAND SURGERY Bilateral   . HEMATOMA EVACUATION Left 11/25/2019   Procedure: EVACUATION HEMATOMA;  Surgeon: Katha Cabal, MD;  Location: [facility];  Service: Vascular;  Laterality: Left;  . I & D EXTREMITY Left 11/25/2019   Procedure: IRRIGATION AND DEBRIDEMENT EXTREMITY;  Surgeon: Katha Cabal, MD;  Location: [facility];  Service: Vascular;  Laterality: Left;  . IR FLUORO GUIDE CV LINE RIGHT  04/06/2020  . IR INJECT/THERA/INC NEEDLE/CATH/PLC EPI/CERV/THOR [facility]  08/13/2020  . IR RADIOLOGIST EVAL & MGMT  07/28/2019  . IR RADIOLOGIST EVAL & MGMT  08/11/2019  . LIGATION OF ARTERIOVENOUS  FISTULA Left 11/11/2019  . LIGATION OF ARTERIOVENOUS  FISTULA Left 11/11/2019   Procedure: LIGATION OF ARTERIOVENOUS  FISTULA;  Surgeon: Katha Cabal, MD;  Location: [facility];  Service: Vascular;  Laterality: Left;  . LIGATIONS OF HERO GRAFT Right 06/13/2020   Procedure: LIGATION / REMOVAL OF RIGHT HERO GRAFT;  Surgeon: Katha Cabal, MD;  Location: [facility];  Service: Vascular;  Laterality: Right;  . PERIPHERAL VASCULAR CATHETERIZATION N/A 12/20/2015   Procedure: Thrombectomy of dialysis access versus permcath placement;  Surgeon: Algernon Huxley, MD;  Location: [facility];  Service: Cardiovascular;  Laterality: N/A;  . PERIPHERAL VASCULAR CATHETERIZATION N/A 12/20/2015   Procedure: A/V Shunt Intervention;  Surgeon: Algernon Huxley, MD;  Location: [facility];  Service: Cardiovascular;  Laterality: N/A;  . PERIPHERAL  VASCULAR CATHETERIZATION N/A 12/20/2015   Procedure: A/V Shuntogram/Fistulagram;  Surgeon: Algernon Huxley, MD;  Location: [facility];  Service: Cardiovascular;  Laterality: N/A;  . PERIPHERAL VASCULAR CATHETERIZATION N/A 01/02/2016   Procedure: A/V Shuntogram/Fistulagram;  Surgeon: Algernon Huxley, MD;  Location: [facility];  Service: Cardiovascular;  Laterality: N/A;  . PERIPHERAL VASCULAR CATHETERIZATION N/A 01/02/2016   Procedure: A/V Shunt Intervention;  Surgeon: Algernon Huxley, MD;  Location: [facility];  Service: Cardiovascular;  Laterality: N/A;  . TEE WITHOUT CARDIOVERSION N/A 06/11/2020   Procedure: TRANSESOPHAGEAL ECHOCARDIOGRAM (TEE);  Surgeon: Teodoro Spray, MD;  Location: [facility];  Service: Cardiovascular;  Laterality: N/A;  . UPPER EXTREMITY ANGIOGRAPHY Bilateral 11/27/2020   Procedure: UPPER EXTREMITY ANGIOGRAPHY;  Surgeon: Katha Cabal, MD;  Location: [facility];  Service: Cardiovascular;  Laterality: Bilateral;  . UPPER EXTREMITY VENOGRAPHY Right 01/18/2020   Procedure: UPPER EXTREMITY VENOGRAPHY;  Surgeon: Katha Cabal, MD;  Location: [facility];  Service: Cardiovascular;  Laterality: Right;  . UPPER EXTREMITY VENOGRAPHY Bilateral 11/27/2020   Procedure: UPPER EXTREMITY VENOGRAPHY;  Surgeon: Katha Cabal, MD;  Location: [facility];  Service: Cardiovascular;  Laterality: Bilateral;  . VASCULAR ACCESS DEVICE INSERTION Right 04/13/2020   Procedure: INSERTION OF HERO VASCULAR ACCESS DEVICE (GRAFT);  Surgeon: Katha Cabal, MD;  Location: [facility];  Service: Vascular;  Laterality: Right;   Family History:  Family History  Problem Relation Age of Onset  . Kidney disease Mother   . Diabetes Mother   . Cancer Father   . Kidney disease Sister    Family Psychiatric  History: Denies any Social History:  Social History   Substance and Sexual Activity  Alcohol Use Not Currently   Comment: glass wine week per pt      Social History   Substance and Sexual Activity  Drug Use Yes  . Types: Marijuana   Comment: once a day    Social History   Socioeconomic History  . Marital status: Divorced    Spouse name: Not on file  . Number of children: Not on file  . Years of education: Not on file  . Highest education level: Not on file  Occupational History  . Not on file  Tobacco Use  . Smoking status: Never Smoker  . Smokeless tobacco: Never Used  Vaping Use  . Vaping Use: Never used  Substance and Sexual Activity  . Alcohol use: Not Currently    Comment: glass wine week per pt  . Drug use: Yes    Types: Marijuana    Comment: once a day  . Sexual activity: Never  Other Topics Concern  . Not on file  Social History Narrative   ** Merged History Encounter **       Social Determinants of Health   Financial Resource Strain: Not on file  Food Insecurity: Not on file  Transportation Needs: Not on file  Physical Activity: Not on file  Stress: Not on file  Social Connections: Not on file   Additional Social History:    Allergies:   Allergies  Allergen Reactions  . Ace Inhibitors Swelling and Anaphylaxis  . Ativan [Lorazepam] Other (See Comments)    Reaction:Hallucinations and headaches  . Compazine [Prochlorperazine Edisylate] Anaphylaxis, Nausea And Vomiting and Other (See Comments)    Other reaction(s): dystonia from this vs. Reglan, 23 Jul - patient relates that she takes promethazine frequently with no problems  . Sumatriptan Succinate Other (See Comments)    Other reaction(s): delirium and hallucinations per [facility] records  . Zofran [Ondansetron] Nausea And Vomiting    Per pt. she is allergic to zofran or will experience adverse reaction like hallucination   . Losartan Nausea Only  . Prochlorperazine Other (See Comments)    Reaction:  Unknown . Patient does not remember reaction but she does have vertigo and anxiety along with n and v at times. Could be used to treat any of these    .  Reglan [Metoclopramide] Other (See Comments)    Per patient her Dr. Evelina Bucy her off it   . Scopolamine Other (See Comments)    Dizziness, also has vertigo already  . Tape Rash    Plastic tape causes rash  . Tapentadol Rash  . Ultrasound Gel Itching    Patient states the Ultrasound gel caused itching while in skin contact and resolved when wiped away.    Labs:  Results for orders placed or performed during the hospital encounter of 12/04/20 (from the past 48 hour(s))  Glucose, capillary     Status: None   Collection Time: 12/05/20  8:20 PM  Result Value Ref Range   Glucose-Capillary 99 70 - 99 mg/dL    Comment: Glucose reference range applies only to samples taken after fasting for at least 8 hours.  CBC     Status: Abnormal   Collection Time: 12/06/20  5:30 AM  Result Value Ref Range   WBC 5.1 4.0 - 10.5 K/uL   RBC 2.80 (L) 3.87 - 5.11 MIL/uL   Hemoglobin 7.9 (L) 12.0 - 15.0 g/dL   HCT 22.9 (L) 36.0 - 46.0 %   MCV 81.8 80.0 - 100.0 fL   MCH 28.2 26.0 - 34.0 pg   MCHC 34.5 30.0 - 36.0 g/dL   RDW 18.6 (H) 11.5 - 15.5 %   Platelets 78 (L) 150 - 400 K/uL    Comment: Immature Platelet Fraction may be clinically indicated, consider ordering this additional test [id number]    nRBC 0.0 0.0 - 0.2 %    Comment: Performed at [facility], [address] [place], [place] [address]  Basic metabolic panel     Status: Abnormal   Collection Time: 12/06/20  5:30 AM  Result Value Ref Range   Sodium 142 135 - 145 mmol/L   Potassium 3.9 3.5 - 5.1 mmol/L   Chloride 91 (L) 98 - 111 mmol/L   CO2 38 (H) 22 - 32 mmol/L   Glucose, Bld 72 70 - 99 mg/dL    Comment: Glucose reference range applies only to samples taken after fasting for at least 8 hours.   BUN 17 8 - 23 mg/dL   Creatinine, Ser 5.25 (H) 0.44 - 1.00 mg/dL   Calcium 8.1 (L) 8.9 - 10.3 mg/dL   GFR, Estimated 9 (L) >60 mL/min    Comment: (NOTE) Calculated using the CKD-EPI Creatinine Equation (2021)    Anion gap 13 5 -  15    Comment: Performed at [facility], [address] [place], [place] [address]  Hepatitis B DNA, ultraquantitative, PCR     Status: None   Collection Time: 12/06/20 11:00 AM  Result Value Ref Range   HBV DNA SERPL PCR-ACNC HBV DNA not detected IU/mL   HBV DNA SERPL PCR-LOG IU UNABLE TO CALCULATE log10 IU/mL    Comment: (NOTE) Unable to calculate result since non-numeric result obtained for component test.    Test Info: Comment     Comment: (NOTE) The reportable range for this assay is 10 IU/mL to 1 billion IU/mL. Performed At: [facility] [place], [place] [id number] Rush Farmer MD [id number]   Hepatitis B surface antigen     Status: None   Collection Time: 12/06/20 11:00 AM  Result Value Ref Range   Hepatitis B Surface Ag NON REACTIVE NON REACTIVE    Comment: Performed at [facility] [address], [place], [place] [address]  Hepatitis B surface antibody  Status: None   Collection Time: 12/06/20 11:00 AM  Result Value Ref Range   Hepatitis B-Post >1,000.0 Immunity>9.9 mIU/mL    Comment: (NOTE)  Status of Immunity                     Anti-HBs Level  ------------------                     -------------- Inconsistent with Immunity                   0.0 - 9.9 Consistent with Immunity                          >9.9 Performed At: [facility] [place], [place] [id number] Rush Farmer MD ER:[id number]   CBC     Status: Abnormal   Collection Time: 12/07/20  4:40 AM  Result Value Ref Range   WBC 4.8 4.0 - 10.5 K/uL   RBC 2.82 (L) 3.87 - 5.11 MIL/uL   Hemoglobin 7.9 (L) 12.0 - 15.0 g/dL   HCT 23.6 (L) 36.0 - 46.0 %   MCV 83.7 80.0 - 100.0 fL   MCH 28.0 26.0 - 34.0 pg   MCHC 33.5 30.0 - 36.0 g/dL   RDW 19.1 (H) 11.5 - 15.5 %   Platelets 76 (L) 150 - 400 K/uL    Comment: Immature Platelet Fraction may be clinically indicated, consider ordering this additional test [id number] CONSISTENT WITH PREVIOUS  RESULT    nRBC 0.0 0.0 - 0.2 %    Comment: Performed at [facility], [address] [place], [place] [address]  Basic metabolic panel     Status: Abnormal   Collection Time: 12/07/20  4:40 AM  Result Value Ref Range   Sodium 140 135 - 145 mmol/L   Potassium 3.8 3.5 - 5.1 mmol/L   Chloride 93 (L) 98 - 111 mmol/L   CO2 37 (H) 22 - 32 mmol/L   Glucose, Bld 71 70 - 99 mg/dL    Comment: Glucose reference range applies only to samples taken after fasting for at least 8 hours.   BUN 10 8 - 23 mg/dL   Creatinine, Ser 4.00 (H) 0.44 - 1.00 mg/dL   Calcium 8.4 (L) 8.9 - 10.3 mg/dL   GFR, Estimated 12 (L) >60 mL/min    Comment: (NOTE) Calculated using the CKD-EPI Creatinine Equation (2021)    Anion gap 10 5 - 15    Comment: Performed at [facility], [address] [place], [place] [address]  Hemoglobin A1c     Status: Abnormal   Collection Time: 12/07/20  5:00 AM  Result Value Ref Range   Hgb A1c MFr Bld 4.6 (L) 4.8 - 5.6 %    Comment: (NOTE) Pre diabetes:          5.7%-6.4%  Diabetes:              >6.4%  Glycemic control for   <7.0% adults with diabetes    Mean Plasma Glucose 85.32 mg/dL    Comment: Performed at [facility] [address] [place], [place] [address]  Glucose, capillary     Status: None   Collection Time: 12/07/20  5:39 PM  Result Value Ref Range   Glucose-Capillary 75 70 - 99 mg/dL    Comment: Glucose reference range applies only to samples taken after fasting for at least 8 hours.  Current Facility-Administered Medications  Medication Dose Route Frequency Provider Last Rate Last Admin  . amLODipine (NORVASC) tablet 10 mg  10 mg Oral Daily Tu, Ching T, DO   10 mg at 12/07/20 0911  . atorvastatin (LIPITOR) tablet 20 mg  20 mg Oral QPM Tu, Ching T, DO   20 mg at 12/07/20 1808  . calcium carbonate (TUMS - dosed in mg elemental calcium) chewable tablet 500 mg of elemental calcium  500 mg of elemental calcium Oral Q6H PRN Tu, Ching T,  DO      . Chlorhexidine Gluconate Cloth 2 % PADS 6 each  6 each Topical Daily Max Sane, MD   6 each at 12/07/20 0912  . Chlorhexidine Gluconate Cloth 2 % PADS 6 each  6 each Topical Q0600 Murlean Iba, MD   6 each at 12/07/20 0445  . cholestyramine light (PREVALITE) packet 4 g  4 g Oral BID Tu, Ching T, DO   4 g at 12/05/20 2216  . diphenhydrAMINE (BENADRYL) injection 25 mg  25 mg Intravenous Q6H PRN Lang Snow, NP   25 mg at 12/07/20 1706  . epoetin alfa (EPOGEN) injection 4,000 Units  4,000 Units Intravenous Q T,Th,Sa-HD Murlean Iba, MD   4,000 Units at 12/06/20 1416  . furosemide (LASIX) injection 80 mg  80 mg Intravenous Daily Tu, Ching T, DO   80 mg at 12/07/20 0911  . gabapentin (NEURONTIN) capsule 100 mg  100 mg Oral QPM Tu, Ching T, DO   100 mg at 12/07/20 1806  . heparin injection 5,000 Units  5,000 Units Subcutaneous Q8H Tu, Ching T, DO   5,000 Units at 12/07/20 1808  . hydrALAZINE (APRESOLINE) tablet 50 mg  50 mg Oral Q8H Tu, Ching T, DO   50 mg at 12/07/20 1806  . HYDROcodone-acetaminophen (NORCO/VICODIN) 5-325 MG per tablet 1 tablet  1 tablet Oral TID PRN Tu, Ching T, DO   1 tablet at 12/06/20 1725  . hydrOXYzine (ATARAX/VISTARIL) tablet 25 mg  25 mg Oral Daily Tu, Ching T, DO   25 mg at 12/07/20 0912  . insulin aspart (novoLOG) injection 0-5 Units  0-5 Units Subcutaneous QHS Manuella Ghazi, Vipul, MD      . insulin aspart (novoLOG) injection 0-9 Units  0-9 Units Subcutaneous TID WC Manuella Ghazi, Vipul, MD      . lipase/protease/amylase (CREON) capsule 36,000 Units  36,000 Units Oral TID AC Tu, Ching T, DO   36,000 Units at 12/07/20 1808  . meclizine (ANTIVERT) tablet 25 mg  25 mg Oral QID PRN Tu, Ching T, DO   25 mg at 12/05/20 1050  . naphazoline-glycerin (CLEAR EYES REDNESS) ophth solution 1-2 drop  1-2 drop Both Eyes BID Philis Pique, NP      . pantoprazole (PROTONIX) EC tablet 40 mg  40 mg Oral Daily Tu, Ching T, DO   40 mg at 12/07/20 0911  . sevelamer carbonate  (RENVELA) tablet 800 mg  800 mg Oral TID WC Lockie Mola B, RPH   800 mg at 12/07/20 1814  . sevelamer carbonate (RENVELA) tablet 800 mg  800 mg Oral Daily PRN Benita Gutter, RPH      . vitamin B-12 (CYANOCOBALAMIN) tablet 1,000 mcg  1,000 mcg Oral Daily Tu, Ching T, DO   1,000 mcg at 12/07/20 0912    Musculoskeletal: Strength & Muscle Tone: decreased Gait & Station: unsteady Patient leans: N/A            Psychiatric Specialty Exam:  Presentation  General Appearance: No data recorded Eye Contact:No data recorded Speech:No data recorded Speech Volume:No data recorded Handedness:No data recorded  Mood and Affect  Mood:No data recorded Affect:No data recorded  Thought Process  Thought Processes:No data recorded Descriptions of Associations:No data recorded Orientation:No data recorded Thought Content:No data recorded History of Schizophrenia/Schizoaffective disorder:No data recorded Duration of Psychotic Symptoms:No data recorded Hallucinations:No data recorded Ideas of Reference:No data recorded Suicidal Thoughts:No data recorded Homicidal Thoughts:No data recorded  Sensorium  Memory:No data recorded Judgment:No data recorded Insight:No data recorded  Executive Functions  Concentration:No data recorded Attention Span:No data recorded Recall:No data recorded Fund of Knowledge:No data recorded Language:No data recorded  Psychomotor Activity  Psychomotor Activity:No data recorded  Assets  Assets:No data recorded  Sleep  Sleep:No data recorded  Physical Exam: Physical Exam Vitals and nursing note reviewed.  Constitutional:      Appearance: Normal appearance. She is ill-appearing.  HENT:     Head: Normocephalic and atraumatic.     Mouth/Throat:     Pharynx: Oropharynx is clear.  Eyes:     Pupils: Pupils are equal, round, and reactive to light.  Cardiovascular:     Rate and Rhythm: Normal rate and regular rhythm.  Pulmonary:     Effort:  Pulmonary effort is normal.     Breath sounds: Normal breath sounds.  Abdominal:     General: Abdomen is flat.     Palpations: Abdomen is soft.  Musculoskeletal:        General: Normal range of motion.  Skin:    General: Skin is warm and dry.  Neurological:     General: No focal deficit present.     Mental Status: She is alert. Mental status is at baseline.  Psychiatric:        Attention and Perception: Attention normal.        Mood and Affect: Mood is anxious. Affect is labile.        Speech: Speech normal.        Behavior: Behavior is agitated. Behavior is not aggressive or hyperactive.        Thought Content: Thought content normal. Thought content does not include homicidal or suicidal ideation.        Cognition and Memory: Cognition normal.        Judgment: Judgment is impulsive.    Review of Systems  Constitutional: Positive for malaise/fatigue.  HENT: Negative.   Eyes: Negative.   Respiratory: Negative.   Cardiovascular: Negative.   Gastrointestinal: Negative.   Musculoskeletal: Negative.   Skin: Negative.   Neurological: Negative.   Psychiatric/Behavioral: Negative for depression, hallucinations, memory loss, substance abuse and suicidal ideas. The patient is nervous/anxious.    Blood pressure (!) 156/67, pulse 63, temperature 98.4 F (36.9 C), temperature source Oral, resp. rate 16, height 5\' 5"  (1.651 m), weight 88.2 kg, SpO2 100 %. Body mass index is 32.36 kg/m.  Treatment Plan Summary: Plan 60 year old woman with end-stage renal disease and multiple medical problems.  Somewhat defensive in the interview but she concludes it by inviting me to come back and chat with her anytime I like.  Patient does not appear to be manic.  Does not appear to be psychotic.  Does not appear to have an acute depressive episode.  Not at all clear that medication would be of any benefit in this situation.  I chatted with her a while and offered supportive counseling and suggested I will  come back and see her if she is still in the hospital  on Monday.  If things decompensate before then or any other specific help is needed please asked the doctor on call over the weekend.  Disposition: No evidence of imminent risk to self or others at present.   Patient does not meet criteria for psychiatric inpatient admission. Supportive therapy provided about ongoing stressors.  Alethia Berthold, MD 12/07/2020 6:44 PM

## 2020-12-07 NOTE — Evaluation (Signed)
Occupational Therapy Evaluation Patient Details Name: [name] MRN: [id number] DOB: 13-Feb-1961 Today's Date: 12/07/2020    History of Present Illness 60 y.o. female with medical history significant for ESRD on HD Tu/Thurs/Sat, DM, Chronic diastolic HF, CAD, PVD, recent T7-T8 discitis/osteomyelitis tx with 6 week course of IV vancomycin and ceftazidime ending on 10/10/20,  atherosclerotic occlusive disease b/l UE s/p right brachial artery angioplasty on 11/27/20 who presents with concerns of worsening abdominal and LE edema and pain   Clinical Impression   Patient presenting with decreased I in self care, balance, functional mobility/transfer, endurance, and safety awareness.  Patient very lethargic throughout the session. RN reports pt has slept all day. Pt is shocked at the time when she looks at her phone. Pt is agreeable to OT intervention. Pt reports living at home alone with use of SPC at home and RW in community. She drives self to dialysis  PTA. Patient currently requires max A to get B LEs off edge of bed and then begins to cry secondary to pain. Pt returning to bed and repositioned on her side for comfort with mod A for bed mobility. Pt is asleep as therapist exited the room. Pt needing higher level of care at this time and SNF recommended to address functional deficits.  Patient will benefit from acute OT to increase overall independence in the areas of ADLs, functional mobility, and safety awareness in order to safely discharge to next venue of care.    Follow Up Recommendations  SNF;Supervision/Assistance - 24 hour    Equipment Recommendations  Other (comment) (defer to next venue of care)       Precautions / Restrictions Precautions Precautions: Fall      Mobility Bed Mobility Overal bed mobility: Needs Assistance Bed Mobility: Rolling Rolling: Mod assist         General bed mobility comments: mod A to roll onto side for repositioning. Pt needing max A to move B LEs off  EOB with pt crying and yelling in pain. Pt declined further mobility/.    Transfers                 General transfer comment: defered secondary to pain        ADL either performed or assessed with clinical judgement   ADL Overall ADL's : Needs assistance/impaired     Grooming: Wash/dry hands;Wash/dry face;Bed level;Set up               Lower Body Dressing: Maximal assistance;Bed level     Toilet Transfer Details (indicate cue type and reason): unable to tolerate                 Vision Patient Visual Report: No change from baseline              Pertinent Vitals/Pain Pain Assessment: Faces Faces Pain Scale: Hurts whole lot Pain Location: B LEs Pain Descriptors / Indicators: Aching;Discomfort;Grimacing;Guarding Pain Intervention(s): Limited activity within patient's tolerance;Monitored during session;Repositioned;Other (comment) (RN notified)     Hand Dominance Left   Extremity/Trunk Assessment Upper Extremity Assessment Upper Extremity Assessment: Generalized weakness   Lower Extremity Assessment Lower Extremity Assessment: Generalized weakness       Communication Communication Communication: No difficulties   Cognition Arousal/Alertness: Lethargic Behavior During Therapy: WFL for tasks assessed/performed Overall Cognitive Status: Within Functional Limits for tasks assessed  Home Living Family/patient expects to be discharged to:: Private residence Living Arrangements: Alone Available Help at Discharge: Family;Available PRN/intermittently Type of Home: Apartment Home Access: Level entry     Home Layout: One level     Bathroom Shower/Tub: Teacher, early years/pre: Standard Bathroom Accessibility: Yes   Home Equipment: Walker - 4 wheels;Cane - single point;Cane - quad;Wheelchair - manual;Toilet riser;Walker - 2 wheels   Additional Comments: patient  reports that she drives herself to dialysis      Prior Functioning/Environment Level of Independence: Independent with assistive device(s)        Comments: Mod I with limited ambulation. Pt reports using SPC within the home and RW in community.        OT Problem List: Decreased strength;Decreased activity tolerance;Decreased knowledge of use of DME or AE;Cardiopulmonary status limiting activity;Decreased safety awareness;Impaired balance (sitting and/or standing);Pain      OT Treatment/Interventions: Self-care/ADL training;Therapeutic exercise;Energy conservation;DME and/or AE instruction;Therapeutic activities;Balance training;Patient/family education;Manual therapy    OT Goals(Current goals can be found in the care plan section) Acute Rehab OT Goals Patient Stated Goal: to feel better OT Goal Formulation: With patient Time For Goal Achievement: 12/21/20 Potential to Achieve Goals: Good ADL Goals Pt Will Perform Grooming: with supervision;standing Pt Will Transfer to Toilet: with supervision;ambulating Pt Will Perform Toileting - Clothing Manipulation and hygiene: with supervision;sit to/from stand Pt Will Perform Tub/Shower Transfer: with supervision;ambulating  OT Frequency: Min 1X/week   Barriers to D/C:    none known at this time          AM-PAC OT "6 Clicks" Daily Activity     Outcome Measure Help from another person eating meals?: None Help from another person taking care of personal grooming?: None Help from another person toileting, which includes using toliet, bedpan, or urinal?: Total Help from another person bathing (including washing, rinsing, drying)?: A Lot Help from another person to put on and taking off regular upper body clothing?: A Little Help from another person to put on and taking off regular lower body clothing?: Total 6 Click Score: 15   End of Session Nurse Communication: Mobility status;Precautions  Activity Tolerance: Patient limited by  pain;Patient limited by lethargy Patient left: in bed;with call bell/phone within reach;with bed alarm set  OT Visit Diagnosis: Unsteadiness on feet (R26.81);Muscle weakness (generalized) (M62.81);Pain Pain - part of body:  (B LEs)                Time: 3299-2426 OT Time Calculation (min): 20 min Charges:  OT General Charges $OT Visit: 1 Visit OT Evaluation $OT Eval Moderate Complexity: 1 Mod OT Treatments $Therapeutic Activity: 8-22 mins  Darleen Crocker, MS, OTR/L , CBIS ascom [phone]  12/07/20, 4:04 PM

## 2020-12-07 NOTE — Progress Notes (Signed)
[facility], [place] 12/07/20  Subjective:   LOS: 2 Patient known to our practice from outpatient dialysis and multiple previous admissions She came in feeling tired and weak.  She reported that large amount of fluid was removed with dialysis because of significant edema Speaking with palliative care team today      Objective:  Vital signs in last 24 hours:  Temp:  [97.6 F (36.4 C)-98.6 F (37 C)] 98 F (36.7 C) (03/18 1129) Pulse Rate:  [52-62] 56 (03/18 1129) Resp:  [17-20] 18 (03/18 1129) BP: (104-149)/(50-90) 131/56 (03/18 1129) SpO2:  [93 %-100 %] 93 % (03/18 1129) Weight:  [88.2 kg] 88.2 kg (03/18 0412)  Weight change: 3.876 kg Filed Weights   12/05/20 0840 12/06/20 0424 12/07/20 0412  Weight: 84.3 kg 88 kg 88.2 kg    Intake/Output:    Intake/Output Summary (Last 24 hours) at 12/07/2020 1253 Last data filed at 12/06/2020 2100 Gross per 24 hour  Intake 480 ml  Output 3000 ml  Net -2520 ml     Physical Exam: General:  Chronically ill-appearing, lying in the bed  HEENT  moist oral mucous membranes  Pulm/lungs  normal breathing effort, [place] o2  CVS/Heart  no rub  Abdomen:   Soft, mild diffuse tenderness, distended  Extremities:  2-3+ pitting edema up to lower abdomen  Neurologic:  Alert, tearful  Skin:  Chronic edema changes over both legs  Access:  Left IJ PermCath       Basic Metabolic Panel:  Recent Labs  Lab 12/04/20 1601 12/05/20 0447 12/06/20 0530 12/07/20 0440  NA 141 141 142 140  K 3.0* 3.4* 3.9 3.8  CL 93* 93* 91* 93*  CO2 37* 37* 38* 37*  GLUCOSE 46*  51* 93 72 71  BUN 14 15 17 10   CREATININE 4.33* 4.71* 5.25* 4.00*  CALCIUM 8.1* 7.9* 8.1* 8.4*  MG  --  1.8  --   --      CBC: Recent Labs  Lab 12/04/20 1601 12/05/20 0447 12/06/20 0530 12/07/20 0440  WBC 3.9* 4.0 5.1 4.8  NEUTROABS 1.5*  --   --   --   HGB 7.1* 6.6* 7.9* 7.9*  HCT 21.7* 19.6* 22.9* 23.6*  MCV 82.2 80.7 81.8 83.7  PLT 93* 82*  78* 76*      Lab Results  Component Value Date   HEPBSAG NON REACTIVE 12/06/2020   HEPBIGM  06/12/2010    NEGATIVE (NOTE) High levels of Hepatitis B Core IgM antibody are detectable during the acute stage of Hepatitis B. This antibody is used to differentiate current from past HBV infection.       Microbiology:  Recent Results (from the past 240 hour(s))  Resp Panel by RT-PCR (Flu A&B, Covid) Nasopharyngeal Swab     Status: None   Collection Time: 12/04/20  4:55 PM   Specimen: Nasopharyngeal Swab; Nasopharyngeal(NP) swabs in vial transport medium  Result Value Ref Range Status   SARS Coronavirus 2 by RT PCR NEGATIVE NEGATIVE Final    Comment: (NOTE) SARS-CoV-2 target nucleic acids are NOT DETECTED.  The SARS-CoV-2 RNA is generally detectable in upper respiratory specimens during the acute phase of infection. The lowest concentration of SARS-CoV-2 viral copies this assay can detect is 138 copies/mL. A negative result does not preclude SARS-Cov-2 infection and should not be used as the sole basis for treatment or other patient management decisions. A negative result may occur with  improper specimen collection/handling, submission of specimen other than nasopharyngeal  swab, presence of viral mutation(s) within the areas targeted by this assay, and inadequate number of viral copies(<138 copies/mL). A negative result must be combined with clinical observations, patient history, and epidemiological information. The expected result is Negative.  Fact Sheet for Patients:  EntrepreneurPulse.com.au  Fact Sheet for Healthcare Providers:  IncredibleEmployment.be  This test is no t yet approved or cleared by the [place] FDA and  has been authorized for detection and/or diagnosis of SARS-CoV-2 by FDA under an Emergency Use Authorization (EUA). This EUA will remain  in effect (meaning this test can be used) for the duration of the COVID-19  declaration under Section 564(b)(1) of the Act, 21 U.S.C.section 360bbb-3(b)(1), unless the authorization is terminated  or revoked sooner.       Influenza A by PCR NEGATIVE NEGATIVE Final   Influenza B by PCR NEGATIVE NEGATIVE Final    Comment: (NOTE) The Xpert Xpress SARS-CoV-2/FLU/RSV plus assay is intended as an aid in the diagnosis of influenza from Nasopharyngeal swab specimens and should not be used as a sole basis for treatment. Nasal washings and aspirates are unacceptable for Xpert Xpress SARS-CoV-2/FLU/RSV testing.  Fact Sheet for Patients: EntrepreneurPulse.com.au  Fact Sheet for Healthcare Providers: IncredibleEmployment.be  This test is not yet approved or cleared by the [place] FDA and has been authorized for detection and/or diagnosis of SARS-CoV-2 by FDA under an Emergency Use Authorization (EUA). This EUA will remain in effect (meaning this test can be used) for the duration of the COVID-19 declaration under Section 564(b)(1) of the Act, 21 U.S.C. section 360bbb-3(b)(1), unless the authorization is terminated or revoked.  Performed at [facility], [address] [place], [place] [address]   MRSA PCR Screening     Status: None   Collection Time: 12/05/20 10:32 AM   Specimen: Nasopharyngeal  Result Value Ref Range Status   MRSA by PCR NEGATIVE NEGATIVE Final    Comment:        The GeneXpert MRSA Assay (FDA approved for NASAL specimens only), is one component of a comprehensive MRSA colonization surveillance program. It is not intended to diagnose MRSA infection nor to guide or monitor treatment for MRSA infections. Performed at [facility], [address] [place], [place] [address]     Coagulation Studies: No results for input(s): LABPROT, INR in the last 72 hours.  Urinalysis: No results for input(s): COLORURINE, LABSPEC, PHURINE, GLUCOSEU, HGBUR, BILIRUBINUR, KETONESUR, PROTEINUR,  UROBILINOGEN, NITRITE, LEUKOCYTESUR in the last 72 hours.  Invalid input(s): APPERANCEUR    Imaging: No results found.   Medications:    . amLODipine  10 mg Oral Daily  . atorvastatin  20 mg Oral QPM  . Chlorhexidine Gluconate Cloth  6 each Topical Daily  . Chlorhexidine Gluconate Cloth  6 each Topical Q0600  . cholestyramine light  4 g Oral BID  . epoetin (EPOGEN/PROCRIT) injection  4,000 Units Intravenous Q T,Th,Sa-HD  . furosemide  80 mg Intravenous Daily  . gabapentin  100 mg Oral QPM  . heparin  5,000 Units Subcutaneous Q8H  . hydrALAZINE  50 mg Oral Q8H  . hydrOXYzine  25 mg Oral Daily  . insulin aspart  0-5 Units Subcutaneous QHS  . insulin aspart  0-9 Units Subcutaneous TID WC  . lipase/protease/amylase  36,000 Units Oral TID AC  . pantoprazole  40 mg Oral Daily  . sevelamer carbonate  800 mg Oral TID WC  . cyanocobalamin  1,000 mcg Oral Daily   calcium carbonate, diphenhydrAMINE, HYDROcodone-acetaminophen, meclizine, sevelamer carbonate  Assessment/ Plan:  60 y.o. female with end-stage renal disease, on chronic diastolic CHF, diabetes type 2, thoracic discitis T7-8 completed treatment jan 2022 was admitted on 12/04/2020 for  Principal Problem:   Fluid overload Active Problems:   End-stage renal disease on hemodialysis (HCC)   HTN (hypertension)   Hypoglycemia associated with diabetes (HCC)   Hypokalemia   Sinus bradycardia   Severe anemia   Anemia in ESRD (end-stage renal disease) (HCC)   Chronic diastolic CHF (congestive heart failure) (HCC)  Chronic pulmonary edema [J81.1] Anasarca [R60.1] ESRD (end stage renal disease) ([place]) [N18.6] Fluid overload [E87.70] Ascites [R18.8] Severe anemia [D64.9]  [facility] dialysis/TTS 1/EDW 83.5 kg/left IJ PermCath/  #. ESRD with anasarca Patient has irregular heart rhythm and is bradycardic at times -3 L removed on Thursday -Plan for HD and volume removal on Saturday  #. Anemia of CKD  Lab Results   Component Value Date   HGB 7.9 (L) 12/07/2020   Low dose EPO with HD.    #. Secondary hyperparathyroidism of renal origin N 25.81      Component Value Date/Time   PTH 357 (H) 10/05/2018 1241   Lab Results  Component Value Date   PHOS 3.9 09/18/2020   PHOS 4.0 09/18/2020   Monitor calcium and phos level during this admission   #. Diabetes type 2 with CKD Hemoglobin A1C (%)  Date Value  07/19/2013 9.3 (H)   Hgb A1c MFr Bld (%)  Date Value  09/18/2020 4.1 (L)       LOS: [place] 3/18/202212:53 PM  [facility] [place], [place]

## 2020-12-07 NOTE — Progress Notes (Signed)
[place] [facility] [place] NAME: [name]    MR#:  [id number]  DATE OF BIRTH:  1961-06-01  SUBJECTIVE:  CHIEF COMPLAINT:   Chief Complaint  Patient presents with  . Weakness  Gets hypoxic at rest oxygen saturation dropped 75% on room air.  Short of breath REVIEW OF SYSTEMS:  Review of Systems  Constitutional: Positive for malaise/fatigue. Negative for diaphoresis, fever and weight loss.  HENT: Negative for ear discharge, ear pain, hearing loss, nosebleeds, sore throat and tinnitus.   Eyes: Negative for blurred vision and pain.  Respiratory: Positive for shortness of breath. Negative for cough, hemoptysis and wheezing.   Cardiovascular: Positive for leg swelling. Negative for chest pain, palpitations and orthopnea.  Gastrointestinal: Negative for abdominal pain, blood in stool, constipation, diarrhea, heartburn, nausea and vomiting.  Genitourinary: Negative for dysuria, frequency and urgency.  Musculoskeletal: Positive for joint pain. Negative for back pain and myalgias.  Skin: Negative for itching and rash.  Neurological: Negative for dizziness, tingling, tremors, focal weakness, seizures, weakness and headaches.  Psychiatric/Behavioral: Negative for depression. The patient is not nervous/anxious.    DRUG ALLERGIES:   Allergies  Allergen Reactions  . Ace Inhibitors Swelling and Anaphylaxis  . Ativan [Lorazepam] Other (See Comments)    Reaction:Hallucinations and headaches  . Compazine [Prochlorperazine Edisylate] Anaphylaxis, Nausea And Vomiting and Other (See Comments)    Other reaction(s): dystonia from this vs. Reglan, 23 Jul - patient relates that she takes promethazine frequently with no problems  . Sumatriptan Succinate Other (See Comments)    Other reaction(s): delirium and hallucinations per [facility] records  . Zofran [Ondansetron] Nausea And Vomiting    Per pt. she is allergic to zofran or will experience adverse reaction like hallucination    . Losartan Nausea Only  . Prochlorperazine Other (See Comments)    Reaction:  Unknown . Patient does not remember reaction but she does have vertigo and anxiety along with n and v at times. Could be used to treat any of these   . Reglan [Metoclopramide] Other (See Comments)    Per patient her Dr. Evelina Bucy her off it   . Scopolamine Other (See Comments)    Dizziness, also has vertigo already  . Tape Rash    Plastic tape causes rash  . Tapentadol Rash  . Ultrasound Gel Itching    Patient states the Ultrasound gel caused itching while in skin contact and resolved when wiped away.   VITALS:  Blood pressure (!) 131/56, pulse (!) 56, temperature 98 F (36.7 C), temperature source Oral, resp. rate 18, height 5' 5"  (1.651 m), weight 88.2 kg, SpO2 93 %. PHYSICAL EXAMINATION:  Physical Exam  60 year old female looks older than her chronological age.  Has anasarca Lungs decreased breath sounds bilaterally rales present Cardiovascular S1-S2 normal Extremities 3+ pedal edema bilateral lower extremities.  + tenderness in bilateral knees and bilateral lower extremities Skin: Leathery thick skin old healed wounds Neurology: She is awake and alert.  Nonfocal exam Psychiatry: Seems frustrated and tearful with her health and condition Access: Left IJ permacath LABORATORY PANEL:  Female CBC Recent Labs  Lab 12/07/20 0440  WBC 4.8  HGB 7.9*  HCT 23.6*  PLT 76*   ------------------------------------------------------------------------------------------------------------------ Chemistries  Recent Labs  Lab 12/04/20 1601 12/05/20 0447 12/06/20 0530 12/07/20 0440  NA 141 141   < > 140  K 3.0* 3.4*   < > 3.8  CL 93* 93*   < >  93*  CO2 37* 37*   < > 37*  GLUCOSE 46*  51* 93   < > 71  BUN 14 15   < > 10  CREATININE 4.33* 4.71*   < > 4.00*  CALCIUM 8.1* 7.9*   < > 8.4*  MG  --  1.8  --   --   AST 22  --   --   --   ALT <5  --   --   --   ALKPHOS 69  --   --   --   BILITOT 0.6  --   --   --     < > = values in this interval not displayed.   RADIOLOGY:  No results found. ASSESSMENT AND PLAN:  ITZAE MCCURDY is a 60 y.o. female with medical history significant for ESRD on HD Tu/Thurs/Sat, DM, Chronic diastolic HF, CAD, PVD, recent T7-T8 discitis/osteomyelitis tx with 6 week course of IV vancomycin and ceftazidime ending on 10/10/20,  atherosclerotic occlusive disease b/l UE s/p right brachial artery angioplasty on 11/27/20 who presents with concerns of worsening abdominal and LE edema and pain  Acute hypoxia Patient desaturates easily on room air to 75% at rest, requiring 2 L oxygen to maintain oxygen above 90% Likely from fluid overload  Anasarca/lower extremity edema in the setting of ESRD on HD Tues/Thurs/Sat HD per nephro.  Received one yesterday and will be scheduled for one tomorrow Abdominal ultrasound not showing any drainable ascites  Chronic diastolic heart failure Does not appear to be in acute exacerbation but significantly hypervolemic on exam.  Hypoglycemia w/hx of Type 2 diabetes BG of 40 on arrival.  Sugar improved now.  Close monitoring  Hypokalemia Repleted and resolved  Anemia of chronic kidney disease Hgb 7.1>6.6>7.9 S/p 1 PRBC transfusion No signs of any obvious bleeding  Asymptomatic bradycardia Continue to monitor on telemetry Has hx of this before Hold Coreg for now  HTN Continue amlodipine and hydroxyzine  Multiple readmissions and overall poor prognosis Patient has had 4 readmissions in last 6 months per epic record Palliative care met with her today.  She does feel tired but knows her family does not want her to give up dialysis yet.  For time being continue palliative care at discharge PT, OT c/s  Obesity Body mass index is 32.36 kg/m.  Net IO Since Admission: -1,168 mL [12/07/20 1425]      Status is: Inpatient  Remains inpatient appropriate because:Hemodynamically unstable, IV treatments appropriate due to intensity of  illness or inability to take PO and Inpatient level of care appropriate due to severity of illness   Dispo: The patient is from: Home              Anticipated d/c is to: Home              Patient currently is not medically stable to d/c.   Difficult to place patient No   DVT prophylaxis:       heparin injection 5,000 Units Start: 12/04/20 2200     Family Communication:  "discussed with patient"   All the records are reviewed and case discussed with Care Management/Social Worker. Management plans discussed with the patient, Nursing and they are in agreement.  CODE STATUS: DNR Level of care: Progressive Cardiac  TOTAL TIME TAKING CARE OF THIS PATIENT: 35 minutes.   More than 50% of the time was spent in counseling/coordination of care: YES  POSSIBLE D/C IN 2-3 DAYS, DEPENDING ON CLINICAL CONDITION.  Max Sane M.D on 12/07/2020 at 2:25 PM  [facility]   CC: Primary care physician; Denton Lank, MD  Note: This dictation was prepared with Dragon dictation along with smaller phrase technology. Any transcriptional errors that result from this process are unintentional.

## 2020-12-07 NOTE — Evaluation (Signed)
Physical Therapy Evaluation Patient Details Name: [name] MRN: [id number] DOB: 1961-01-05 Today's Date: 12/07/2020   History of Present Illness  Pt is a 60 y.o. female with medical history significant for ESRD on HD Tu/Thurs/Sat, DM, Chronic diastolic HF, CAD, PVD, recent T7-T8 discitis/osteomyelitis tx with 6 week course of IV vancomycin and ceftazidime ending on 10/10/20,  atherosclerotic occlusive disease b/l UE s/p right brachial artery angioplasty on 11/27/20 who presents with concerns of worsening abdominal and LE edema and pain.  MD assessment includes: Acute hypoxia, anasarca/lower extremity edema in the setting of ESRD, hypoglycemia, hypokalemia, anemia of chronic kidney disease, bradycardia, and HTN.    Clinical Impression  Pt initially required significant encouragement to attempt sitting up to the EOB but pt's motivation improved as the session progressed and at the end of the session she requested to attempt to come to standing.  That said the patient presented with significant functional weakness and required heavy +2 assist with bed mobility tasks and even with +2 assist she was unable to clear the surface of the bed when trying to stand.  During the session the patient removed her nasal canula and declined to put it back on.  Pt's SpO2 was monitored while on room air and remained in the low 90s during the session, nursing notified.  Pt will benefit from PT services in a SNF setting upon discharge to safely address deficits listed in patient problem list for decreased caregiver assistance and eventual return to PLOF.     Follow Up Recommendations SNF;Supervision/Assistance - 24 hour    Equipment Recommendations  None recommended by PT    Recommendations for Other Services       Precautions / Restrictions Precautions Precautions: Fall Restrictions Weight Bearing Restrictions: No      Mobility  Bed Mobility Overal bed mobility: Needs Assistance Bed Mobility: Rolling;Supine  to Sit;Sit to Supine Rolling: Mod assist   Supine to sit: +2 for physical assistance;Max assist Sit to supine: +2 for physical assistance;Max assist   General bed mobility comments: +2 max A for BLE and trunk control during sup to/from sit    Transfers                 General transfer comment: Attempted sit to stand from elevated EOB with +2 mod A but pt unable to clear the surface of the mattress.  Ambulation/Gait             General Gait Details: Unable  Stairs            Wheelchair Mobility    Modified Rankin (Stroke Patients Only)       Balance Overall balance assessment: Needs assistance Sitting-balance support: Bilateral upper extremity supported;Feet supported Sitting balance-Leahy Scale: Fair                                       Pertinent Vitals/Pain Pain Assessment: 0-10 Pain Score: 8  Faces Pain Scale: Hurts whole lot Pain Location: BLE general pain Pain Descriptors / Indicators: Aching;Sore Pain Intervention(s): Premedicated before session;Monitored during session;Repositioned    Home Living Family/patient expects to be discharged to:: Private residence Living Arrangements: Alone Available Help at Discharge: Family;Available PRN/intermittently Type of Home: Apartment Home Access: Level entry     Home Layout: One level Home Equipment: Walker - 4 wheels;Cane - single point;Cane - quad;Wheelchair - manual;Toilet riser;Walker - 2 wheels Additional Comments: Patient reports that  she drives herself to dialysis    Prior Function Level of Independence: Independent with assistive device(s)         Comments: Mod I with limited ambulation. Pt reports using SPC within the home and RW in community.     Hand Dominance   Dominant Hand: Left    Extremity/Trunk Assessment   Upper Extremity Assessment Upper Extremity Assessment: Generalized weakness    Lower Extremity Assessment Lower Extremity Assessment: Generalized  weakness       Communication   Communication: No difficulties  Cognition Arousal/Alertness: Awake/alert Behavior During Therapy: WFL for tasks assessed/performed Overall Cognitive Status: Within Functional Limits for tasks assessed                                        General Comments      Exercises Other Exercises Other Exercises: Pt education provided on physiological benefits of activity Other Exercises: Static sitting at EOB for core strengthening and improved activity tolerance   Assessment/Plan    PT Assessment Patient needs continued PT services  PT Problem List Decreased strength;Decreased activity tolerance;Decreased balance;Decreased mobility;Decreased knowledge of use of DME       PT Treatment Interventions DME instruction;Gait training;Functional mobility training;Therapeutic activities;Therapeutic exercise;Balance training;Patient/family education    PT Goals (Current goals can be found in the Care Plan section)  Acute Rehab PT Goals Patient Stated Goal: To feel better PT Goal Formulation: With patient Time For Goal Achievement: 12/20/20    Frequency Min 2X/week   Barriers to discharge Inaccessible home environment;Decreased caregiver support      Co-evaluation               AM-PAC PT "6 Clicks" Mobility  Outcome Measure Help needed turning from your back to your side while in a flat bed without using bedrails?: A Lot Help needed moving from lying on your back to sitting on the side of a flat bed without using bedrails?: Total Help needed moving to and from a bed to a chair (including a wheelchair)?: Total Help needed standing up from a chair using your arms (e.g., wheelchair or bedside chair)?: Total Help needed to walk in hospital room?: Total Help needed climbing 3-5 steps with a railing? : Total 6 Click Score: 7    End of Session Equipment Utilized During Treatment: Gait belt Activity Tolerance: Patient tolerated  treatment well Patient left: in bed;with nursing/sitter in room;with family/visitor present;with call bell/phone within reach Nurse Communication: Mobility status;Other (comment) (Bed alarm not functioning; pt removed O2 and would not put it back on) PT Visit Diagnosis: Muscle weakness (generalized) (M62.81);Difficulty in walking, not elsewhere classified (R26.2)    Time: 6004-5997 PT Time Calculation (min) (ACUTE ONLY): 37 min   Charges:   PT Evaluation $PT Eval Moderate Complexity: 1 Mod PT Treatments $Therapeutic Activity: 8-22 mins        D. Royetta Asal PT, DPT 12/07/20, 5:33 PM

## 2020-12-07 NOTE — Progress Notes (Addendum)
Refuses any other snacks but sandwich for blood glucose of 62 mg/dl at 2229. Encouraged to eat snacks to prepare for hemodialysis tomorrow. Reeducated on diet and fluid restriction but pt refused to adhere to instructions. Bottles of soda and cups of ice water at bedside table.  Pt raises voice when reminded of restrictions. Pt was irritable and refusing touch/turn. Pt has been complaining of right arm weakness, pt raised her arm up while holding her sandwich, told me to watch her arm then dropped her sandwich. It documented BUE has limited movement and weakness. Endorsed to Decl[place], Therapist, sports for further evaluation and mgt if needed.

## 2020-12-07 NOTE — Progress Notes (Signed)
[facility]) Hospital Liaison note:  This patient is currently enrolled in [facility] outpatient-based palliative care. Will continue to follow for disposition.  Please call with any outpatient-based palliative care questions or concerns.  Thank you, Lorelee Market, LPN [facility] Liaison [phone]

## 2020-12-07 NOTE — Consult Note (Signed)
Consultation Note Date: 12/07/2020   Patient Name: [name]  DOB: 17-Oct-1960  MRN: [id number]  Age / Sex: 60 y.o., female  PCP: Denton Lank, MD Referring Physician: Max Sane, MD  Reason for Consultation: Establishing goals of care  HPI/Patient Profile: 60 y.o. female  with past medical history of ESRD on HD, F3LK, Chronic diastolic HF, CAD, PVD, recent T7-T8 discitis/osteomyelitis tx with 6 week course of IV vancomycin and ceftazidime ending on 10/10/20, and atherosclerotic occlusive disease b/l UE s/p right brachial artery angioplasty on 11/27/20  admitted on 12/04/2020 with concerns of worsening abdominal and LE edema and pain. Patient has had multiple admissions - 4 in the past 6 months per epic. She is followed by outpatient palliative care. PMT consulted for ongoing [place] discussions.   Clinical Assessment and Goals of Care: I have reviewed medical records including EPIC notes, labs and imaging, assessed the patient and then met with patient to discuss diagnosis prognosis, GOC, EOL wishes, disposition and options.  I introduced Palliative Medicine as specialized medical care for people living with serious illness. It focuses on providing relief from the symptoms and stress of a serious illness. The goal is to improve quality of life for both the patient and the family.  [name] is feeling better - less abdominal swelling. No shortness of breath. Still feeling tired. Complains of itchy, dry eyes - requesting eye drops and I will order.   We discussed a brief life review of the patient. She tells me about her family - she has a daughter and a son. Daughter not as involved in her medical care as son. Son is supportive and lives close by.   As far as functional and nutritional status, she tells me she is still getting up walking around at home. She occasionally uses a cane or walker. She has a wheelchair in her home if needed. She tells me of very  poor appetite d/t nausea. She also tells me about frequently falling asleep unintentionally.    We discussed patient's current illness and what it means in the larger context of patient's on-going co-morbidities.  Natural disease trajectory and expectations at EOL were discussed. We discuss her weak heart and bad kidneys. We discuss that she is getting weaker. She tells me she is very tired and tired of coming to the hospital.   I attempted to elicit values and goals of care important to the patient.  She tells me what is most important to her is being at home. She loves to be able to sit at her door or window and watch the birds and squirrels outside.   The difference between aggressive medical intervention and comfort care was considered in light of the patient's goals of care.   We discuss her feelings about HD. She tells me she enjoys going to HD - she enjoys getting out of the house and enjoys the social interaction. I ask her if she feels like her body is able to endure it and she tells me she is not sure. She tells me she considers stopping it sometimes because she is so tired.   Ms. Maclaren tells me about her decision for DNR/DNI - tells me when her heart stops she is ready to go but until that time she would like to continue current interventions such as HD, returning to hospital as needed.   Discussed with patient/family the importance of continued conversation with family and the medical providers regarding overall plan of care and treatment options,  ensuring decisions are within the context of the patient's values and GOCs.    Hospice and Palliative Care services outpatient were explained and offered. I discuss with [name] that she is eligible for hospice any time she decides she does not want to continue with HD. We discuss type of support provided by hospice. We discuss that if she stopped HD time would be very limited - days to possibly a couple of weeks. We discuss continued palliative  follow up and [name] is agreeable.   [name] shares that she is not sure her family understands how fragile her body is - she believes they are in denial. She tells me she is trying to help them understand that time may be short for her. She tells me about her 29 yr old grandson and he is a reason she would like to continue HD for now.   [name] does share with me her concerns about transport to HD - I have sent message to HD care manager and placed [facility] order for resources.   Questions and concerns were addressed. The family was encouraged to call with questions or concerns.   Primary Decision Maker PATIENT    SUMMARY OF RECOMMENDATIONS   - wants to continue HD for now, though does admit to being very tired, not sure how much longer she wants to continue - discussed hospice, eligibility any time she feels she can no longer continue HD - ongoing palliative support outpatient - liaison aware - DNR  Code Status/Advance Care Planning:  DNR  Symptom Management:   Added eye drops for dry eye  Discharge Planning: To Be Determined      Primary Diagnoses: Present on Admission: . Fluid overload . HTN (hypertension) . Sinus bradycardia . Anemia in ESRD (end-stage renal disease) ([place]) . Hypokalemia . Hypoglycemia associated with diabetes ([place]) . Severe anemia   I have reviewed the medical record, interviewed the patient and family, and examined the patient. The following aspects are pertinent.  Past Medical History:  Diagnosis Date  . Anemia   . Anginal pain ([place])   . Anxiety   . Arthritis   . Asthma   . Broken wrist   . Bronchitis   . chronic diastolic CHF 1/61/0960  . Chronic kidney disease   . COPD (chronic obstructive pulmonary disease) ([place])   . Coronary artery disease    a. cath 2013: stenting to RCA (report not available); b. cath 2014: LM nl, pLAD 40%, mLAD nl, ost LCx 40%, mid LCx nl, pRCA 30% @ site of prior stent, mRCA 50%  . Depression   . Diabetes mellitus  ([place])   . Diabetes mellitus without complication ([place])   . Diabetic neuropathy ([place])   . dialysis 2006  . Diverticulosis   . Dizziness   . Dyspnea   . Elevated lipids   . Environmental and seasonal allergies   . ESRD (end stage renal disease) on dialysis ([place])    M-W-F  . Gastroparesis   . GERD (gastroesophageal reflux disease)   . Headache   . History of anemia due to chronic kidney disease   . History of hiatal hernia   . HOH (hard of hearing)   . Hx of pancreatitis 2015  . Hypertension   . Lower extremity edema   . Mitral regurgitation    a. echo 10/2013: EF 62%, noWMA, mildly dilated LA, mild to mod MR/TR, GR1DD  . Myocardial infarction ([place])   . Orthopnea   . Parathyroid  abnormality ([place])   . Peripheral arterial disease ([place])   . Pneumonia   . Renal cancer ([place])   . Renal insufficiency    Pt is on dialysis on M,W + F.  . Wheezing    Social History   Socioeconomic History  . Marital status: Divorced    Spouse name: Not on file  . Number of children: Not on file  . Years of education: Not on file  . Highest education level: Not on file  Occupational History  . Not on file  Tobacco Use  . Smoking status: Never Smoker  . Smokeless tobacco: Never Used  Vaping Use  . Vaping Use: Never used  Substance and Sexual Activity  . Alcohol use: Not Currently    Comment: glass wine week per pt  . Drug use: Yes    Types: Marijuana    Comment: once a day  . Sexual activity: Never  Other Topics Concern  . Not on file  Social History Narrative   ** Merged History Encounter **       Social Determinants of Health   Financial Resource Strain: Not on file  Food Insecurity: Not on file  Transportation Needs: Not on file  Physical Activity: Not on file  Stress: Not on file  Social Connections: Not on file   Family History  Problem Relation Age of Onset  . Kidney disease Mother   . Diabetes Mother   . Cancer Father   . Kidney disease Sister    Scheduled Meds: .  amLODipine  10 mg Oral Daily  . atorvastatin  20 mg Oral QPM  . Chlorhexidine Gluconate Cloth  6 each Topical Daily  . Chlorhexidine Gluconate Cloth  6 each Topical Q0600  . cholestyramine light  4 g Oral BID  . epoetin (EPOGEN/PROCRIT) injection  4,000 Units Intravenous Q T,Th,Sa-HD  . furosemide  80 mg Intravenous Daily  . gabapentin  100 mg Oral QPM  . heparin  5,000 Units Subcutaneous Q8H  . hydrALAZINE  50 mg Oral Q8H  . hydrOXYzine  25 mg Oral Daily  . insulin aspart  0-5 Units Subcutaneous QHS  . insulin aspart  0-9 Units Subcutaneous TID WC  . lipase/protease/amylase  36,000 Units Oral TID AC  . pantoprazole  40 mg Oral Daily  . sevelamer carbonate  800 mg Oral TID WC  . cyanocobalamin  1,000 mcg Oral Daily   Continuous Infusions: PRN Meds:.calcium carbonate, diphenhydrAMINE, HYDROcodone-acetaminophen, meclizine, sevelamer carbonate Allergies  Allergen Reactions  . Ace Inhibitors Swelling and Anaphylaxis  . Ativan [Lorazepam] Other (See Comments)    Reaction:Hallucinations and headaches  . Compazine [Prochlorperazine Edisylate] Anaphylaxis, Nausea And Vomiting and Other (See Comments)    Other reaction(s): dystonia from this vs. Reglan, 23 Jul - patient relates that she takes promethazine frequently with no problems  . Sumatriptan Succinate Other (See Comments)    Other reaction(s): delirium and hallucinations per [facility] records  . Zofran [Ondansetron] Nausea And Vomiting    Per pt. she is allergic to zofran or will experience adverse reaction like hallucination   . Losartan Nausea Only  . Prochlorperazine Other (See Comments)    Reaction:  Unknown . Patient does not remember reaction but she does have vertigo and anxiety along with n and v at times. Could be used to treat any of these   . Reglan [Metoclopramide] Other (See Comments)    Per patient her Dr. Evelina Bucy her off it   . Scopolamine Other (See Comments)  Dizziness, also has vertigo already  . Tape Rash     Plastic tape causes rash  . Tapentadol Rash  . Ultrasound Gel Itching    Patient states the Ultrasound gel caused itching while in skin contact and resolved when wiped away.   Review of Systems  Constitutional: Positive for activity change, appetite change, fatigue and unexpected weight change.  Eyes: Positive for itching.  Respiratory: Positive for shortness of breath.   Gastrointestinal: Positive for abdominal distention.  Neurological: Positive for weakness.    Physical Exam Constitutional:      General: She is not in acute distress. Pulmonary:     Effort: Pulmonary effort is normal.  Musculoskeletal:     Right lower leg: Edema present.     Left lower leg: Edema present.  Skin:    General: Skin is warm and dry.  Neurological:     Mental Status: She is alert and oriented to person, place, and time.     Vital Signs: BP (!) 131/56 (BP Location: Left Wrist)   Pulse (!) 56   Temp 98 F (36.7 C) (Oral)   Resp 18   Ht _0  (1.651 m)   Wt 88.2 kg   SpO2 93%   BMI 32.36 kg/m  Pain Scale: 0-10 POSS *See Group Information*: 2-Acceptable,Slightly drowsy, easily aroused Pain Score: 0-No pain   SpO2: SpO2: 93 % O2 Device:SpO2: 93 % O2 Flow Rate: .O2 Flow Rate (L/min): 2 L/min  IO: Intake/output summary:   Intake/Output Summary (Last 24 hours) at 12/07/2020 1319 Last data filed at 12/06/2020 2100 Gross per 24 hour  Intake 480 ml  Output 3000 ml  Net -2520 ml    LBM: Last BM Date: 12/06/20 Baseline Weight: Weight: 84.3 kg Most recent weight: Weight: 88.2 kg     Palliative Assessment/Data: PPS 40%    Time Total: 85 minutes Greater than 50%  of this time was spent counseling and coordinating care related to the above assessment and plan.  Juel Burrow, DNP, AGNP-C Palliative Medicine Team [phone] Pager: [phone]

## 2020-12-07 NOTE — Progress Notes (Signed)
Attempted to wean patient of supplemental oxygen. Patient placed on room air. Patient's oxygen saturation reduced to 75% at rest on room air. 2L of supplemental O2 replaced. Patient recovered and oxygen saturation is now reading 93% on 2L [place].

## 2020-12-07 NOTE — Care Management Important Message (Signed)
Important Message  Patient Details  Name: [name] MRN: [id number] Date of Birth: [birth year]   Medicare Important Message Given:  N/A - LOS <3 / Initial given by admissions  Initial Medicare IM given by Malachy Moan, Patient Access Associate on 12/06/2020 at 9:30am.    Dannette Barbara 12/07/2020, 8:47 AM

## 2020-12-08 DIAGNOSIS — F4323 Adjustment disorder with mixed anxiety and depressed mood: Secondary | ICD-10-CM | POA: Diagnosis not present

## 2020-12-08 LAB — GLUCOSE, CAPILLARY
Glucose-Capillary: 54 mg/dL — ABNORMAL LOW (ref 70–99)
Glucose-Capillary: 66 mg/dL — ABNORMAL LOW (ref 70–99)
Glucose-Capillary: 73 mg/dL (ref 70–99)
Glucose-Capillary: 74 mg/dL (ref 70–99)
Glucose-Capillary: 130 mg/dL — ABNORMAL HIGH (ref 70–99)
Glucose-Capillary: 46 mg/dL — ABNORMAL LOW (ref 70–99)
Glucose-Capillary: 129 mg/dL — ABNORMAL HIGH (ref 70–99)

## 2020-12-08 LAB — BASIC METABOLIC PANEL
Anion gap: 12 (ref 5–15)
BUN: 15 mg/dL (ref 8–23)
CO2: 34 mmol/L — ABNORMAL HIGH (ref 22–32)
Calcium: 8.6 mg/dL — ABNORMAL LOW (ref 8.9–10.3)
Chloride: 93 mmol/L — ABNORMAL LOW (ref 98–111)
Creatinine, Ser: 4.51 mg/dL — ABNORMAL HIGH (ref 0.44–1.00)
GFR, Estimated: 11 mL/min — ABNORMAL LOW (ref >60)
Glucose, Bld: 51 mg/dL — ABNORMAL LOW (ref 70–99)
Potassium: 4.3 mmol/L (ref 3.5–5.1)
Sodium: 139 mmol/L (ref 135–145)

## 2020-12-08 LAB — CBC
HCT: 27.3 % — ABNORMAL LOW (ref 36.0–46.0)
Hemoglobin: 8.6 g/dL — ABNORMAL LOW (ref 12.0–15.0)
MCH: 27 pg (ref 26.0–34.0)
MCHC: 31.5 g/dL (ref 30.0–36.0)
MCV: 85.8 fL (ref 80.0–100.0)
Platelets: 86 10*3/uL — ABNORMAL LOW (ref 150–400)
RBC: 3.18 MIL/uL — ABNORMAL LOW (ref 3.87–5.11)
RDW: 19.2 % — ABNORMAL HIGH (ref 11.5–15.5)
WBC: 7.4 10*3/uL (ref 4.0–10.5)
nRBC: 0 % (ref 0.0–0.2)

## 2020-12-08 MED ORDER — DEXTROSE 50 % IV SOLN: 50.0000 mL | Freq: Once | INTRAVENOUS | Status: DC

## 2020-12-08 MED ORDER — DEXTROSE 50 % IV SOLN
INTRAVENOUS | Status: AC
  Filled 2020-12-08: qty 50

## 2020-12-08 NOTE — Progress Notes (Signed)
[facility], [place] 12/08/20  Subjective:   LOS: 3 Patient sleeping in bed, receiving dialysis treatment   HEMODIALYSIS FLOWSHEET:  Blood Flow Rate (mL/min): 300 mL/min Arterial Pressure (mmHg): -110 mmHg Venous Pressure (mmHg): 80 mmHg Transmembrane Pressure (mmHg): 70 mmHg Ultrafiltration Rate (mL/min): 1140 mL/min Dialysate Flow Rate (mL/min): 600 ml/min Conductivity: Machine : 13.6 Conductivity: Machine : 13.6 Dialysis Fluid Bolus: Normal Saline Bolus Amount (mL): 200 mL Dialysate Change:  (3k bath as ordered)       Objective:  Vital signs in last 24 hours:  Temp:  [97.8 F (36.6 C)-98.4 F (36.9 C)] 98.1 F (36.7 C) (03/19 1533) Pulse Rate:  [49-65] 58 (03/19 1533) Resp:  [8-19] 19 (03/19 1533) BP: (122-156)/(54-82) 149/64 (03/19 1533) SpO2:  [94 %-100 %] 100 % (03/19 1533) Weight:  [86.2 kg] 86.2 kg (03/19 0016)  Weight change: -1.971 kg Filed Weights   12/06/20 0424 12/07/20 0412 12/08/20 0016  Weight: 88 kg 88.2 kg 86.2 kg    Intake/Output:    Intake/Output Summary (Last 24 hours) at 12/08/2020 1546 Last data filed at 12/08/2020 1345 Gross per 24 hour  Intake 240 ml  Output 3503 ml  Net -3263 ml     Physical Exam: General:  Sleeping in bed, receiving HD  HEENT  Normocephalic,atraumatic, moist oral mucous membranes  Pulm/lungs  Lungs clear, respirations symmetrical, unlabored,on 3L O2, SpO2 100%  CVS/Heart  S1S2,no rubs or gallops,HR in 50's   Abdomen:   Soft, mild diffuse tenderness, distended  Extremities:  2+bilateral lower extremity edema  Neurologic:  Sleeping, easily arousable to call or gentle touch  Skin:  No acute lesions or rashes  Access:  Left IJ PermCath       Basic Metabolic Panel:  Recent Labs  Lab 12/04/20 1601 12/05/20 0447 12/06/20 0530 12/07/20 0440 12/08/20 0425  NA 141 141 142 140 139  K 3.0* 3.4* 3.9 3.8 4.3  CL 93* 93* 91* 93* 93*  CO2 37* 37* 38* 37* 34*  GLUCOSE 46*  51* 93 72  71 51*  BUN 14 15 17 10 15   CREATININE 4.33* 4.71* 5.25* 4.00* 4.51*  CALCIUM 8.1* 7.9* 8.1* 8.4* 8.6*  MG  --  1.8  --   --   --      CBC: Recent Labs  Lab 12/04/20 1601 12/05/20 0447 12/06/20 0530 12/07/20 0440 12/08/20 0425  WBC 3.9* 4.0 5.1 4.8 7.4  NEUTROABS 1.5*  --   --   --   --   HGB 7.1* 6.6* 7.9* 7.9* 8.6*  HCT 21.7* 19.6* 22.9* 23.6* 27.3*  MCV 82.2 80.7 81.8 83.7 85.8  PLT 93* 82* 78* 76* 86*      Lab Results  Component Value Date   HEPBSAG NON REACTIVE 12/06/2020   HEPBIGM  06/12/2010    NEGATIVE (NOTE) High levels of Hepatitis B Core IgM antibody are detectable during the acute stage of Hepatitis B. This antibody is used to differentiate current from past HBV infection.       Microbiology:  Recent Results (from the past 240 hour(s))  Resp Panel by RT-PCR (Flu A&B, Covid) Nasopharyngeal Swab     Status: None   Collection Time: 12/04/20  4:55 PM   Specimen: Nasopharyngeal Swab; Nasopharyngeal(NP) swabs in vial transport medium  Result Value Ref Range Status   SARS Coronavirus 2 by RT PCR NEGATIVE NEGATIVE Final    Comment: (NOTE) SARS-CoV-2 target nucleic acids are NOT DETECTED.  The SARS-CoV-2 RNA is generally detectable in  upper respiratory specimens during the acute phase of infection. The lowest concentration of SARS-CoV-2 viral copies this assay can detect is 138 copies/mL. A negative result does not preclude SARS-Cov-2 infection and should not be used as the sole basis for treatment or other patient management decisions. A negative result may occur with  improper specimen collection/handling, submission of specimen other than nasopharyngeal swab, presence of viral mutation(s) within the areas targeted by this assay, and inadequate number of viral copies(<138 copies/mL). A negative result must be combined with clinical observations, patient history, and epidemiological information. The expected result is Negative.  Fact Sheet for Patients:   EntrepreneurPulse.com.au  Fact Sheet for Healthcare Providers:  IncredibleEmployment.be  This test is no t yet approved or cleared by the [place] FDA and  has been authorized for detection and/or diagnosis of SARS-CoV-2 by FDA under an Emergency Use Authorization (EUA). This EUA will remain  in effect (meaning this test can be used) for the duration of the COVID-19 declaration under Section 564(b)(1) of the Act, 21 U.S.C.section 360bbb-3(b)(1), unless the authorization is terminated  or revoked sooner.       Influenza A by PCR NEGATIVE NEGATIVE Final   Influenza B by PCR NEGATIVE NEGATIVE Final    Comment: (NOTE) The Xpert Xpress SARS-CoV-2/FLU/RSV plus assay is intended as an aid in the diagnosis of influenza from Nasopharyngeal swab specimens and should not be used as a sole basis for treatment. Nasal washings and aspirates are unacceptable for Xpert Xpress SARS-CoV-2/FLU/RSV testing.  Fact Sheet for Patients: EntrepreneurPulse.com.au  Fact Sheet for Healthcare Providers: IncredibleEmployment.be  This test is not yet approved or cleared by the [place] FDA and has been authorized for detection and/or diagnosis of SARS-CoV-2 by FDA under an Emergency Use Authorization (EUA). This EUA will remain in effect (meaning this test can be used) for the duration of the COVID-19 declaration under Section 564(b)(1) of the Act, 21 U.S.C. section 360bbb-3(b)(1), unless the authorization is terminated or revoked.  Performed at [facility], [address] [place], [place] [address]   MRSA PCR Screening     Status: None   Collection Time: 12/05/20 10:32 AM   Specimen: Nasopharyngeal  Result Value Ref Range Status   MRSA by PCR NEGATIVE NEGATIVE Final    Comment:        The GeneXpert MRSA Assay (FDA approved for NASAL specimens only), is one component of a comprehensive MRSA  colonization surveillance program. It is not intended to diagnose MRSA infection nor to guide or monitor treatment for MRSA infections. Performed at [facility], [address] [place], [place] [address]     Coagulation Studies: No results for input(s): LABPROT, INR in the last 72 hours.  Urinalysis: No results for input(s): COLORURINE, LABSPEC, PHURINE, GLUCOSEU, HGBUR, BILIRUBINUR, KETONESUR, PROTEINUR, UROBILINOGEN, NITRITE, LEUKOCYTESUR in the last 72 hours.  Invalid input(s): APPERANCEUR    Imaging: No results found.   Medications:    . dextrose      . amLODipine  10 mg Oral Daily  . atorvastatin  20 mg Oral QPM  . Chlorhexidine Gluconate Cloth  6 each Topical Daily  . cholestyramine light  4 g Oral BID  . dextrose  50 mL Intravenous Once  . epoetin (EPOGEN/PROCRIT) injection  4,000 Units Intravenous Q T,Th,Sa-HD  . furosemide  80 mg Intravenous Daily  . gabapentin  100 mg Oral QPM  . heparin  5,000 Units Subcutaneous Q8H  . hydrALAZINE  50 mg Oral Q8H  . hydrOXYzine  25 mg Oral Daily  . insulin aspart  0-5 Units Subcutaneous QHS  . insulin aspart  0-9 Units Subcutaneous TID WC  . lipase/protease/amylase  36,000 Units Oral TID AC  . naphazoline-glycerin  1-2 drop Both Eyes BID  . pantoprazole  40 mg Oral Daily  . sevelamer carbonate  800 mg Oral TID WC  . cyanocobalamin  1,000 mcg Oral Daily   calcium carbonate, diphenhydrAMINE, HYDROcodone-acetaminophen, meclizine, sevelamer carbonate  Assessment/ Plan:  60 y.o. female with end-stage renal disease, on chronic diastolic CHF, diabetes type 2, thoracic discitis T7-8 completed treatment jan 2022 was admitted on 12/04/2020 for  Principal Problem:   Adjustment disorder with mixed anxiety and depressed mood Active Problems:   End-stage renal disease on hemodialysis (HCC)   HTN (hypertension)   Hypoglycemia associated with diabetes ([place])   Hypokalemia   Sinus bradycardia   Severe anemia   Anemia in  ESRD (end-stage renal disease) (HCC)   Fluid overload   Chronic diastolic CHF (congestive heart failure) (HCC)  Chronic pulmonary edema [J81.1] Anasarca [R60.1] ESRD (end stage renal disease) ([place]) [N18.6] Fluid overload [E87.70] Ascites [R18.8] Severe anemia [D64.9]  [facility] dialysis/TTS 1/EDW 83.5 kg/left IJ PermCath/  #. ESRD with anasarca Patient receiving HD today, tolerating well Will plan to continue TTS schedule   #. Anemia of CKD  Lab Results  Component Value Date   HGB 8.6 (L) 12/08/2020   Epogen 4000 units with HD, TTS    #. Secondary hyperparathyroidism of renal origin N 25.81      Component Value Date/Time   PTH 357 (H) 10/05/2018 1241   Lab Results  Component Value Date   PHOS 3.9 09/18/2020   PHOS 4.0 09/18/2020   Will continue monitoring bone mineral metabolism parameters   #. Diabetes type 2 with CKD Hemoglobin A1C (%)  Date Value  07/19/2013 9.3 (H)   Hgb A1c MFr Bld (%)  Date Value  12/07/2020 4.6 (L)    Intermittent hypoglycemic episodes noted during this admission Encourage PO intake    LOS: [place] 3/19/20223:46 PM  [place], [place]

## 2020-12-08 NOTE — Progress Notes (Signed)
Pt fsbs 46- pt alert and currently drinking apple juice/ MD made aware/ amp of dextrose given/ fsbs improved to 130- will continue to monitor close

## 2020-12-08 NOTE — Progress Notes (Addendum)
[facility] NAME: [name]    MR#:  [id number]  DATE OF BIRTH:  Oct 20, 1960  SUBJECTIVE:  seen at dialysis earlier. 3 1/2 L of fluid was removed. Patient says breathing is much improved. Currently on 3 L nasal cannula oxygen. Overall weak REVIEW OF SYSTEMS:  Review of Systems  Constitutional: Positive for malaise/fatigue. Negative for diaphoresis, fever and weight loss.  HENT: Negative for ear discharge, ear pain, hearing loss, nosebleeds, sore throat and tinnitus.   Eyes: Negative for blurred vision and pain.  Respiratory: Positive for shortness of breath. Negative for cough, hemoptysis and wheezing.   Cardiovascular: Positive for leg swelling. Negative for chest pain, palpitations and orthopnea.  Gastrointestinal: Negative for abdominal pain, blood in stool, constipation, diarrhea, heartburn, nausea and vomiting.  Genitourinary: Negative for dysuria, frequency and urgency.  Musculoskeletal: Negative for back pain and myalgias.  Skin: Negative for itching and rash.  Neurological: Positive for weakness. Negative for dizziness, tingling, tremors, focal weakness, seizures and headaches.  Psychiatric/Behavioral: Negative for depression. The patient is not nervous/anxious.    DRUG ALLERGIES:   Allergies  Allergen Reactions  . Ace Inhibitors Swelling and Anaphylaxis  . Ativan [Lorazepam] Other (See Comments)    Reaction:Hallucinations and headaches  . Compazine [Prochlorperazine Edisylate] Anaphylaxis, Nausea And Vomiting and Other (See Comments)    Other reaction(s): dystonia from this vs. Reglan, 23 Jul - patient relates that she takes promethazine frequently with no problems  . Sumatriptan Succinate Other (See Comments)    Other reaction(s): delirium and hallucinations per [facility] records  . Zofran [Ondansetron] Nausea And Vomiting    Per pt. she is allergic to zofran or will experience adverse reaction like hallucination   . Losartan Nausea  Only  . Prochlorperazine Other (See Comments)    Reaction:  Unknown . Patient does not remember reaction but she does have vertigo and anxiety along with n and v at times. Could be used to treat any of these   . Reglan [Metoclopramide] Other (See Comments)    Per patient her Dr. Evelina Bucy her off it   . Scopolamine Other (See Comments)    Dizziness, also has vertigo already  . Tape Rash    Plastic tape causes rash  . Tapentadol Rash  . Ultrasound Gel Itching    Patient states the Ultrasound gel caused itching while in skin contact and resolved when wiped away.   VITALS:  Blood pressure (!) 153/60, pulse (!) 56, temperature 98.3 F (36.8 C), temperature source Oral, resp. rate 11, height _0  (1.651 m), weight 86.2 kg, SpO2 100 %. PHYSICAL EXAMINATION:  Physical Exam  60 year old female looks older than her chronological age.  Has anasarca Lungs decreased breath sounds bilaterally rales present Cardiovascular S1-S2 normal Extremities 2+ pedal edema bilateral lower extremities.  + tenderness in bilateral knees and bilateral lower extremities Skin: Leathery thick skin old healed wounds Neurology: She is awake and alert.  Nonfocal exam Psychiatry: mood and affect stable  Access: Left IJ permacath LABORATORY PANEL:  Female CBC Recent Labs  Lab 12/08/20 0425  WBC 7.4  HGB 8.6*  HCT 27.3*  PLT 86*   ------------------------------------------------------------------------------------------------------------------ Chemistries  Recent Labs  Lab 12/04/20 1601 12/05/20 0447 12/06/20 0530 12/08/20 0425  NA 141 141   < > 139  K 3.0* 3.4*   < > 4.3  CL 93* 93*   < > 93*  CO2 37* 37*   < >  34*  GLUCOSE 46*  51* 93   < > 51*  BUN 14 15   < > 15  CREATININE 4.33* 4.71*   < > 4.51*  CALCIUM 8.1* 7.9*   < > 8.6*  MG  --  1.8  --   --   AST 22  --   --   --   ALT <5  --   --   --   ALKPHOS 69  --   --   --   BILITOT 0.6  --   --   --    < > = values in this interval not displayed.    RADIOLOGY:  No results found. ASSESSMENT AND PLAN:  [name] is a 60 y.o. female with medical history significant for ESRD on HD Tu/Thurs/Sat, DM, Chronic diastolic HF, CAD, PVD, recent T7-T8 discitis/osteomyelitis tx with 6 week course of IV vancomycin and ceftazidime ending on 10/10/20,  atherosclerotic occlusive disease b/l UE s/p right brachial artery angioplasty on 11/27/20 who presents with concerns of worsening abdominal and LE edema and pain  Acute hypoxia/volume overload/pulmonary edema --Patient desaturates easily on room air to 75% at rest, requiring 2 L oxygen to maintain oxygen above 90% --Likely from fluid overload -- total 6.5 L fluid removed since patient has been in the hospital  Anasarca/lower extremity edema in the setting of ESRD on HD Tues/Thurs/Sat --HD per nephro.   --Abdominal ultrasound not showing any drainable ascites  Chronic diastolic heart failure -- continue management as above  Hypoglycemia w/hx of Type 2 diabetes/ESRD --Pt has issues with poor po intake. Encourage to eat better to avoid dropping of sugar. -A1c is 4.6%  Hypokalemia --Repleted and resolved  Anemia of chronic kidney disease --Hgb 7.1>6.6>7.9 --S/p 1 PRBC transfusion --No signs of any obvious bleeding  Asymptomatic bradycardia --Continue to monitor on telemetry --Has hx of this before --Hold Coreg for now  HTN --Continue amlodipine and hydroxyzine  Multiple readmissions and overall poor prognosis -Patient has had 4 readmissions in last 6 months per epic record -Palliative care met with her.  She does feel tired but knows her family does not want her to give up dialysis yet.  For time being continue palliative care at discharge  chronic depression/mood disorder -- patient was seen by Dr. Weber Cooks. Currently no medications recommended. He will follow while patient is in-house.   Obesity Body mass index is 31.63 kg/m.  Net IO Since Admission: -4,431 mL [12/08/20  1444]      Status is: Inpatient  Remains inpatient appropriate because:Hemodynamically unstable, IV treatments appropriate due to intensity of illness or inability to take PO and Inpatient level of care appropriate due to severity of illness   Dispo: The patient is from: Home              Anticipated d/c is to: Home              Patient currently is not medically stable to d/c.   Difficult to place patient No   DVT prophylaxis:       heparin injection 5,000 Units Start: 12/04/20 2200     Family Communication: none today  CODE STATUS: DNR Level of care: Progressive Cardiac  TOTAL TIME TAKING CARE OF THIS PATIENT: 25 minutes.   More than 50% of the time was spent in counseling/coordination of care: YES  POSSIBLE D/C IN 2-3 DAYS, DEPENDING ON CLINICAL CONDITION.   Fritzi Mandes M.D on 12/08/2020 at 2:44 PM  [facility]  CC: Primary care physician; Denton Lank, MD  Note: This dictation was prepared with Dragon dictation along with smaller phrase technology. Any transcriptional errors that result from this process are unintentional.

## 2020-12-09 DIAGNOSIS — E8770 Fluid overload, unspecified: Secondary | ICD-10-CM | POA: Diagnosis not present

## 2020-12-09 DIAGNOSIS — D649 Anemia, unspecified: Secondary | ICD-10-CM | POA: Diagnosis not present

## 2020-12-09 DIAGNOSIS — N186 End stage renal disease: Secondary | ICD-10-CM | POA: Diagnosis not present

## 2020-12-09 DIAGNOSIS — I5032 Chronic diastolic (congestive) heart failure: Secondary | ICD-10-CM | POA: Diagnosis not present

## 2020-12-09 LAB — GLUCOSE, CAPILLARY
Glucose-Capillary: 141 mg/dL — ABNORMAL HIGH (ref 70–99)
Glucose-Capillary: 125 mg/dL — ABNORMAL HIGH (ref 70–99)
Glucose-Capillary: 103 mg/dL — ABNORMAL HIGH (ref 70–99)
Glucose-Capillary: 114 mg/dL — ABNORMAL HIGH (ref 70–99)

## 2020-12-09 NOTE — Progress Notes (Signed)
[facility], [place] 12/09/20  Subjective:   LOS: 4 Patient resting in bed, in no acute distress. She received HD yesterday with UF of 3.5L, tolerated well.       Objective:  Vital signs in last 24 hours:  Temp:  [97.7 F (36.5 C)-98.4 F (36.9 C)] 97.7 F (36.5 C) (03/20 1139) Pulse Rate:  [53-58] 58 (03/20 1139) Resp:  [12-20] 12 (03/20 0737) BP: (116-149)/(51-64) 141/58 (03/20 1139) SpO2:  [100 %] 100 % (03/20 1139) Weight:  [83.5 kg] 83.5 kg (03/20 0247)  Weight change: -2.729 kg Filed Weights   12/07/20 0412 12/08/20 0016 12/09/20 0247  Weight: 88.2 kg 86.2 kg 83.5 kg    Intake/Output:    Intake/Output Summary (Last 24 hours) at 12/09/2020 1518 Last data filed at 12/09/2020 0559 Gross per 24 hour  Intake 720 ml  Output --  Net 720 ml     Physical Exam: General:  In no acute distress or pain  HEENT  moist oral mucous membranes  Pulm/lungs  Lungs clear, on 3L O2, respiration symmetrical,unlabored  CVS/Heart  S1S2,no rubs or gallops  Abdomen:   Soft,non distended  Extremities:  1+bilateral lower extremity edema  Neurologic:  Oriented x3   Skin:  No acute lesions or rashes  Access:  Left IJ PermCath       Basic Metabolic Panel:  Recent Labs  Lab 12/04/20 1601 12/05/20 0447 12/06/20 0530 12/07/20 0440 12/08/20 0425  NA 141 141 142 140 139  K 3.0* 3.4* 3.9 3.8 4.3  CL 93* 93* 91* 93* 93*  CO2 37* 37* 38* 37* 34*  GLUCOSE 46*  51* 93 72 71 51*  BUN 14 15 17 10 15   CREATININE 4.33* 4.71* 5.25* 4.00* 4.51*  CALCIUM 8.1* 7.9* 8.1* 8.4* 8.6*  MG  --  1.8  --   --   --      CBC: Recent Labs  Lab 12/04/20 1601 12/05/20 0447 12/06/20 0530 12/07/20 0440 12/08/20 0425  WBC 3.9* 4.0 5.1 4.8 7.4  NEUTROABS 1.5*  --   --   --   --   HGB 7.1* 6.6* 7.9* 7.9* 8.6*  HCT 21.7* 19.6* 22.9* 23.6* 27.3*  MCV 82.2 80.7 81.8 83.7 85.8  PLT 93* 82* 78* 76* 86*      Lab Results  Component Value Date   HEPBSAG NON REACTIVE  12/06/2020   HEPBIGM  06/12/2010    NEGATIVE (NOTE) High levels of Hepatitis B Core IgM antibody are detectable during the acute stage of Hepatitis B. This antibody is used to differentiate current from past HBV infection.       Microbiology:  Recent Results (from the past 240 hour(s))  Resp Panel by RT-PCR (Flu A&B, Covid) Nasopharyngeal Swab     Status: None   Collection Time: 12/04/20  4:55 PM   Specimen: Nasopharyngeal Swab; Nasopharyngeal(NP) swabs in vial transport medium  Result Value Ref Range Status   SARS Coronavirus 2 by RT PCR NEGATIVE NEGATIVE Final    Comment: (NOTE) SARS-CoV-2 target nucleic acids are NOT DETECTED.  The SARS-CoV-2 RNA is generally detectable in upper respiratory specimens during the acute phase of infection. The lowest concentration of SARS-CoV-2 viral copies this assay can detect is 138 copies/mL. A negative result does not preclude SARS-Cov-2 infection and should not be used as the sole basis for treatment or other patient management decisions. A negative result may occur with  improper specimen collection/handling, submission of specimen other than nasopharyngeal swab, presence of  viral mutation(s) within the areas targeted by this assay, and inadequate number of viral copies(<138 copies/mL). A negative result must be combined with clinical observations, patient history, and epidemiological information. The expected result is Negative.  Fact Sheet for Patients:  EntrepreneurPulse.com.au  Fact Sheet for Healthcare Providers:  IncredibleEmployment.be  This test is no t yet approved or cleared by the [place] FDA and  has been authorized for detection and/or diagnosis of SARS-CoV-2 by FDA under an Emergency Use Authorization (EUA). This EUA will remain  in effect (meaning this test can be used) for the duration of the COVID-19 declaration under Section 564(b)(1) of the Act, 21 U.S.C.section  360bbb-3(b)(1), unless the authorization is terminated  or revoked sooner.       Influenza A by PCR NEGATIVE NEGATIVE Final   Influenza B by PCR NEGATIVE NEGATIVE Final    Comment: (NOTE) The Xpert Xpress SARS-CoV-2/FLU/RSV plus assay is intended as an aid in the diagnosis of influenza from Nasopharyngeal swab specimens and should not be used as a sole basis for treatment. Nasal washings and aspirates are unacceptable for Xpert Xpress SARS-CoV-2/FLU/RSV testing.  Fact Sheet for Patients: EntrepreneurPulse.com.au  Fact Sheet for Healthcare Providers: IncredibleEmployment.be  This test is not yet approved or cleared by the [place] FDA and has been authorized for detection and/or diagnosis of SARS-CoV-2 by FDA under an Emergency Use Authorization (EUA). This EUA will remain in effect (meaning this test can be used) for the duration of the COVID-19 declaration under Section 564(b)(1) of the Act, 21 U.S.C. section 360bbb-3(b)(1), unless the authorization is terminated or revoked.  Performed at [facility], [address] [place], [place] [address]   MRSA PCR Screening     Status: None   Collection Time: 12/05/20 10:32 AM   Specimen: Nasopharyngeal  Result Value Ref Range Status   MRSA by PCR NEGATIVE NEGATIVE Final    Comment:        The GeneXpert MRSA Assay (FDA approved for NASAL specimens only), is one component of a comprehensive MRSA colonization surveillance program. It is not intended to diagnose MRSA infection nor to guide or monitor treatment for MRSA infections. Performed at [facility], [address] [place], [place] [address]     Coagulation Studies: No results for input(s): LABPROT, INR in the last 72 hours.  Urinalysis: No results for input(s): COLORURINE, LABSPEC, PHURINE, GLUCOSEU, HGBUR, BILIRUBINUR, KETONESUR, PROTEINUR, UROBILINOGEN, NITRITE, LEUKOCYTESUR in the last 72  hours.  Invalid input(s): APPERANCEUR    Imaging: No results found.   Medications:    . amLODipine  10 mg Oral Daily  . atorvastatin  20 mg Oral QPM  . Chlorhexidine Gluconate Cloth  6 each Topical Daily  . cholestyramine light  4 g Oral BID  . dextrose  50 mL Intravenous Once  . epoetin (EPOGEN/PROCRIT) injection  4,000 Units Intravenous Q T,Th,Sa-HD  . furosemide  80 mg Intravenous Daily  . gabapentin  100 mg Oral QPM  . heparin  5,000 Units Subcutaneous Q8H  . hydrALAZINE  50 mg Oral Q8H  . hydrOXYzine  25 mg Oral Daily  . insulin aspart  0-5 Units Subcutaneous QHS  . insulin aspart  0-9 Units Subcutaneous TID WC  . lipase/protease/amylase  36,000 Units Oral TID AC  . naphazoline-glycerin  1-2 drop Both Eyes BID  . pantoprazole  40 mg Oral Daily  . sevelamer carbonate  800 mg Oral TID WC  . cyanocobalamin  1,000 mcg Oral Daily   calcium carbonate, diphenhydrAMINE,  HYDROcodone-acetaminophen, meclizine, sevelamer carbonate  Assessment/ Plan:  60 y.o. female with end-stage renal disease, on chronic diastolic CHF, diabetes type 2, thoracic discitis T7-8 completed treatment jan 2022 was admitted on 12/04/2020 for  Principal Problem:   Adjustment disorder with mixed anxiety and depressed mood Active Problems:   End-stage renal disease on hemodialysis (HCC)   HTN (hypertension)   Hypoglycemia associated with diabetes ([place])   Hypokalemia   Sinus bradycardia   Severe anemia   Anemia in ESRD (end-stage renal disease) (HCC)   Fluid overload   Chronic diastolic CHF (congestive heart failure) (HCC)  Chronic pulmonary edema [J81.1] Anasarca [R60.1] ESRD (end stage renal disease) ([place]) [N18.6] Fluid overload [E87.70] Ascites [R18.8] Severe anemia [D64.9]  [facility] dialysis/TTS 1/EDW 83.5 kg/left IJ PermCath/  #. ESRD with anasarca Patient received dialysis treatment yesterday with UF of 3.5L Volume and electrolyte status acceptable No additional dialysis  required  Will continue TTS schedule  #. Anemia of CKD  Lab Results  Component Value Date   HGB 8.6 (L) 12/08/2020   Continue Epogen with HD    #. Secondary hyperparathyroidism of renal origin N 25.81      Component Value Date/Time   PTH 357 (H) 10/05/2018 1241   Lab Results  Component Value Date   PHOS 3.9 09/18/2020   PHOS 4.0 09/18/2020   On Sevelamer 800 mg TID   #. Diabetes type 2 with CKD Hemoglobin A1C (%)  Date Value  07/19/2013 9.3 (H)   Hgb A1c MFr Bld (%)  Date Value  12/07/2020 4.6 (L)    Diabetic management per primary team   LOS: August 3/20/20223:18 [facility] [place], [place]

## 2020-12-09 NOTE — Progress Notes (Signed)
[place] [facility] [place] NAME: [name]    MR#:  [id number]  DATE OF BIRTH:  1960/11/13  SUBJECTIVE:  patient appears wide awake. Alert oriented. Eating breakfast. Sugars much improved. Breathing better. REVIEW OF SYSTEMS:  Review of Systems  Constitutional: Positive for malaise/fatigue. Negative for diaphoresis, fever and weight loss.  HENT: Negative for ear discharge, ear pain, hearing loss, nosebleeds, sore throat and tinnitus.   Eyes: Negative for blurred vision and pain.  Respiratory: Positive for shortness of breath. Negative for cough, hemoptysis and wheezing.   Cardiovascular: Positive for leg swelling. Negative for chest pain, palpitations and orthopnea.  Gastrointestinal: Negative for abdominal pain, blood in stool, constipation, diarrhea, heartburn, nausea and vomiting.  Genitourinary: Negative for dysuria, frequency and urgency.  Musculoskeletal: Negative for back pain and myalgias.  Skin: Negative for itching and rash.  Neurological: Positive for weakness. Negative for dizziness, tingling, tremors, focal weakness, seizures and headaches.  Psychiatric/Behavioral: Negative for depression. The patient is not nervous/anxious.    DRUG ALLERGIES:   Allergies  Allergen Reactions  . Ace Inhibitors Swelling and Anaphylaxis  . Ativan [Lorazepam] Other (See Comments)    Reaction:Hallucinations and headaches  . Compazine [Prochlorperazine Edisylate] Anaphylaxis, Nausea And Vomiting and Other (See Comments)    Other reaction(s): dystonia from this vs. Reglan, 23 Jul - patient relates that she takes promethazine frequently with no problems  . Sumatriptan Succinate Other (See Comments)    Other reaction(s): delirium and hallucinations per [facility] records  . Zofran [Ondansetron] Nausea And Vomiting    Per pt. she is allergic to zofran or will experience adverse reaction like hallucination   . Losartan Nausea Only  . Prochlorperazine Other (See Comments)     Reaction:  Unknown . Patient does not remember reaction but she does have vertigo and anxiety along with n and v at times. Could be used to treat any of these   . Reglan [Metoclopramide] Other (See Comments)    Per patient her Dr. Evelina Bucy her off it   . Scopolamine Other (See Comments)    Dizziness, also has vertigo already  . Tape Rash    Plastic tape causes rash  . Tapentadol Rash  . Ultrasound Gel Itching    Patient states the Ultrasound gel caused itching while in skin contact and resolved when wiped away.   VITALS:  Blood pressure (!) 141/58, pulse (!) 58, temperature 97.7 F (36.5 C), resp. rate 12, height $RemoveBe'5\' 5"'[id number]$  (1.651 m), weight 83.5 kg, SpO2 100 %. PHYSICAL EXAMINATION:  Physical Exam  60 year old female looks older than her chronological age.  Has anasarca Lungs decreased breath sounds bilaterally few  rales present Cardiovascular S1-S2 normal  Extremities 2+ pedal edema bilateral lower extremities.  + tenderness in bilateral knees and bilateral lower extremities Skin: Leathery thick skin old healed wounds Neurology: She is awake and alert.  Nonfocal exam Psychiatry: mood and affect stable  Access: Left IJ permacath LABORATORY PANEL:  Female CBC Recent Labs  Lab 12/08/20 0425  WBC 7.4  HGB 8.6*  HCT 27.3*  PLT 86*   ------------------------------------------------------------------------------------------------------------------ Chemistries  Recent Labs  Lab 12/04/20 1601 12/05/20 0447 12/06/20 0530 12/08/20 0425  NA 141 141   < > 139  K 3.0* 3.4*   < > 4.3  CL 93* 93*   < > 93*  CO2 37* 37*   < > 34*  GLUCOSE 46*  51* 93   < > 51*  BUN 14 15   < > 15  CREATININE 4.33* 4.71*   < > 4.51*  CALCIUM 8.1* 7.9*   < > 8.6*  MG  --  1.8  --   --   AST 22  --   --   --   ALT <5  --   --   --   ALKPHOS 69  --   --   --   BILITOT 0.6  --   --   --    < > = values in this interval not displayed.   RADIOLOGY:  No results found. ASSESSMENT AND PLAN:  [name] is a 60 y.o. female with medical history significant for ESRD on HD Tu/Thurs/Sat, DM, Chronic diastolic HF, CAD, PVD, recent T7-T8 discitis/osteomyelitis tx with 6 week course of IV vancomycin and ceftazidime ending on 10/10/20,  atherosclerotic occlusive disease b/l UE s/p right brachial artery angioplasty on 11/27/20 who presents with concerns of worsening abdominal and LE edema and pain  Acute hypoxia/volume overload/pulmonary edema --Patient desaturates easily on room air to 75% at rest, requiring 2 L oxygen to maintain oxygen above 90% --Likely from fluid overload -- total 6.5 L fluid removed since patient has been in the hospital  Anasarca/lower extremity edema in the setting of ESRD on HD Tues/Thurs/Sat --HD per nephro.   --Abdominal ultrasound not showing any drainable ascites  Chronic diastolic heart failure -- continue management as above  Hypoglycemia w/hx of Type 2 diabetes/ESRD --Pt has issues with poor po intake. Encourage to eat better to avoid dropping of sugar. -A1c is 4.6% -sugars stable today  Hypokalemia --Repleted and resolved  Anemia of chronic kidney disease --Hgb 7.1>6.6>7.9>8.6 --S/p 1 PRBC transfusion --No signs of any obvious bleeding  Asymptomatic bradycardia --Continue to monitor on telemetry --Has hx of this before --Hold Coreg for now  HTN --Continue amlodipine and hydroxyzine  Multiple readmissions and overall poor prognosis -Patient has had 4 readmissions in last 6 months per epic record -Palliative care met with her.  She does feel tired but knows her family does not want her to give up dialysis yet.  For time being continue palliative care at discharge  chronic depression/mood disorder -- patient was seen by Dr. Weber Cooks. Currently no medications recommended. He will follow while patient is in-house.   Obesity Body mass index is 30.63 kg/m.  Net IO Since Admission: -3,711 mL [12/09/20 1153]      Status is:  Inpatient  Remains inpatient appropriate because:Hemodynamically unstable, IV treatments appropriate due to intensity of illness or inability to take PO and Inpatient level of care appropriate due to severity of illness   Dispo: The patient is from: Home              Anticipated d/c is to: Home              Patient currently is not medically stable to d/c.will d/w nephrology regarding continued HD with UF and d/c planning   Difficult to place patient No   DVT prophylaxis:       heparin injection 5,000 Units Start: 12/04/20 2200     Family Communication: none today  CODE STATUS: DNR Level of care: Progressive Cardiac  TOTAL TIME TAKING CARE OF THIS PATIENT: 25 minutes.   More than 50% of the time was spent in counseling/coordination of care: YES  POSSIBLE D/C IN 2-3 DAYS, DEPENDING ON CLINICAL CONDITION.   Fritzi Mandes M.D on 12/09/2020 at 11:53 AM  [facility]  CC: Primary care physician; Denton Lank, MD  Note: This dictation was prepared with Dragon dictation along with smaller phrase technology. Any transcriptional errors that result from this process are unintentional.

## 2020-12-10 ENCOUNTER — Ambulatory Visit (INDEPENDENT_AMBULATORY_CARE_PROVIDER_SITE_OTHER): Payer: Medicare HMO | Admitting: Vascular Surgery

## 2020-12-10 DIAGNOSIS — F4323 Adjustment disorder with mixed anxiety and depressed mood: Secondary | ICD-10-CM | POA: Diagnosis not present

## 2020-12-10 LAB — GLUCOSE, CAPILLARY
Glucose-Capillary: 119 mg/dL — ABNORMAL HIGH (ref 70–99)
Glucose-Capillary: 132 mg/dL — ABNORMAL HIGH (ref 70–99)
Glucose-Capillary: 132 mg/dL — ABNORMAL HIGH (ref 70–99)
Glucose-Capillary: 65 mg/dL — ABNORMAL LOW (ref 70–99)
Glucose-Capillary: 83 mg/dL (ref 70–99)
Glucose-Capillary: 69 mg/dL — ABNORMAL LOW (ref 70–99)
Glucose-Capillary: 77 mg/dL (ref 70–99)
Glucose-Capillary: 77 mg/dL (ref 70–99)

## 2020-12-10 NOTE — Progress Notes (Signed)
PROGRESS NOTE    SARAE [name]  SNK:[id number] DOB: [birth year] DOA: 12/04/2020 PCP: Denton Lank, MD   Brief Narrative: Taken from prior notes. [name] a 60 y.o.femalewith medical history significant forESRD on HD Tu/Thurs/Sat, DM, Chronic diastolic HF, CAD, PVD, recent T7-T8 discitis/osteomyelitis tx with 6 week course of IV vancomycin and ceftazidime ending on 10/10/20, atherosclerotic occlusive disease b/l UE s/p right brachial artery angioplasty on 11/27/20 who presents with concerns of worsening abdominal and LE edema and pain. Remained volume up, although improving.  Volume is being managed with dialysis.  Subjective: Patient was resting comfortably when seen today.  No new complaint.  Stating that she continued to have abdominal wall swelling although improving.  She was complaining of generalized weakness.  Assessment & Plan:   Principal Problem:   Adjustment disorder with mixed anxiety and depressed mood Active Problems:   End-stage renal disease on hemodialysis (HCC)   HTN (hypertension)   Hypoglycemia associated with diabetes (HCC)   Hypokalemia   Sinus bradycardia   Severe anemia   Anemia in ESRD (end-stage renal disease) (HCC)   Fluid overload   Chronic diastolic CHF (congestive heart failure) (HCC)  Acute hypoxic respiratory failure secondary to pulmonary edema.  Respiratory status improving, currently saturating 100% on 2 L of oxygen. -Wean as tolerated. -Volume is being controlled with dialysis.  Anasarca/chronic diastolic heart failure. -Continue with dialysis to control extra volume, improving but still appears up from her baseline. -Continue with IV Lasix  Generalized weakness/multiple admissions.  Patient has more than 4 admissions in the past 75-month per chart review. Palliative care was consulted-wants to continue with dialysis and will continue with palliative care as an outpatient. -PT recommending SNF placement -TOC consult -Overall poor  prognosis.  Hypoglycemia with history of type 2 diabetes.  Secondary to poor p.o. intake.  A1c of 4.6. CBG currently stable. -Continue to monitor  Hypertension.  Blood pressure within goal. -Continue home dose of amlodipine and hydralazine -Continue with IV Lasix  Anemia of chronic kidney disease.  S/p 1 unit of PRBC. Hemoglobin improved to 12.6 today but all cell lines increased so may be some element of hemoconcentration. -Continue to monitor  Asymptomatic bradycardia. -Keep holding Coreg.  History of chronic depression/mood disorder.  Not on any medication at this time.  She was seen by Dr. Weber Cooks.  Stage I obesity. Estimated body mass index is 30.63 kg/m as calculated from the following:   Height as of this encounter: 5\' 5"  (1.651 m).   Weight as of this encounter: 83.5 kg.  Objective: Vitals:   12/10/20 0355 12/10/20 0824 12/10/20 1205 12/10/20 1523  BP: (!) 149/69 (!) 137/49 (!) 140/51 (!) 127/55  Pulse: 62 63 (!) 58 (!) 56  Resp: 18 16 18 16   Temp: 98 F (36.7 C) 97.9 F (36.6 C) 97.8 F (36.6 C) (!) 97.5 F (36.4 C)  TempSrc: Oral Oral Oral Oral  SpO2: 100% 100% 100% 100%  Weight:      Height:        Intake/Output Summary (Last 24 hours) at 12/10/2020 1713 Last data filed at 12/10/2020 0400 Gross per 24 hour  Intake 360 ml  Output --  Net 360 ml   Filed Weights   12/07/20 0412 12/08/20 0016 12/09/20 0247  Weight: 88.2 kg 86.2 kg 83.5 kg    Examination:  General exam: Appears calm and comfortable  Respiratory system: Clear to auscultation. Respiratory effort normal. Cardiovascular system: S1 & S2 heard, RRR.  Gastrointestinal system: Soft,  nontender, nondistended, bowel sounds positive. Central nervous system: Alert and oriented. No focal neurological deficits. Extremities: 2+ LE edema, no cyanosis, pulses intact and symmetrical. Psychiatry: Judgement and insight appear normal.   DVT prophylaxis: Heparin Code Status: DNR Family Communication:  Discussed with patient Disposition Plan:  Status is: Inpatient  Remains inpatient appropriate because:Inpatient level of care appropriate due to severity of illness   Dispo: The patient is from: Home              Anticipated d/c is to: SNF              Patient currently is not medically stable to d/c.   Difficult to place patient No              Level of care: Progressive Cardiac  All the records are reviewed and case discussed with Care Management/Social Worker. Management plans discussed with the patient, nursing and they are in agreement.  Consultants:   Nephrology  Procedures:  Antimicrobials:   Data Reviewed: I have personally reviewed following labs and imaging studies  CBC: Recent Labs  Lab 12/04/20 1601 12/05/20 0447 12/06/20 0530 12/07/20 0440 12/08/20 0425  WBC 3.9* 4.0 5.1 4.8 7.4  NEUTROABS 1.5*  --   --   --   --   HGB 7.1* 6.6* 7.9* 7.9* 8.6*  HCT 21.7* 19.6* 22.9* 23.6* 27.3*  MCV 82.2 80.7 81.8 83.7 85.8  PLT 93* 82* 78* 76* 86*   Basic Metabolic Panel: Recent Labs  Lab 12/04/20 1601 12/05/20 0447 12/06/20 0530 12/07/20 0440 12/08/20 0425  NA 141 141 142 140 139  K 3.0* 3.4* 3.9 3.8 4.3  CL 93* 93* 91* 93* 93*  CO2 37* 37* 38* 37* 34*  GLUCOSE 46*  51* 93 72 71 51*  BUN 14 15 17 10 15   CREATININE 4.33* 4.71* 5.25* 4.00* 4.51*  CALCIUM 8.1* 7.9* 8.1* 8.4* 8.6*  MG  --  1.8  --   --   --    GFR: Estimated Creatinine Clearance: 14 mL/min (A) (by C-G formula based on SCr of 4.51 mg/dL (H)). Liver Function Tests: Recent Labs  Lab 12/04/20 1601  AST 22  ALT <5  ALKPHOS 69  BILITOT 0.6  PROT 6.4*  ALBUMIN 2.8*   No results for input(s): LIPASE, AMYLASE in the last 168 hours. No results for input(s): AMMONIA in the last 168 hours. Coagulation Profile: No results for input(s): INR, PROTIME in the last 168 hours. Cardiac Enzymes: No results for input(s): CKTOTAL, CKMB, CKMBINDEX, TROPONINI in the last 168 hours. BNP (last 3  results) No results for input(s): PROBNP in the last 8760 hours. HbA1C: No results for input(s): HGBA1C in the last 72 hours. CBG: Recent Labs  Lab 12/09/20 1611 12/09/20 2048 12/10/20 0816 12/10/20 1203 12/10/20 1703  GLUCAP 103* 114* 119* 132* 132*   Lipid Profile: No results for input(s): CHOL, HDL, LDLCALC, TRIG, CHOLHDL, LDLDIRECT in the last 72 hours. Thyroid Function Tests: No results for input(s): TSH, T4TOTAL, FREET4, T3FREE, THYROIDAB in the last 72 hours. Anemia Panel: No results for input(s): VITAMINB12, FOLATE, FERRITIN, TIBC, IRON, RETICCTPCT in the last 72 hours. Sepsis Labs: Recent Labs  Lab 12/04/20 1655 12/04/20 1820  LATICACIDVEN 0.7 0.7    Recent Results (from the past 240 hour(s))  Resp Panel by RT-PCR (Flu A&B, Covid) Nasopharyngeal Swab     Status: None   Collection Time: 12/04/20  4:55 PM   Specimen: Nasopharyngeal Swab; Nasopharyngeal(NP) swabs in vial transport medium  Result Value Ref Range Status   SARS Coronavirus 2 by RT PCR NEGATIVE NEGATIVE Final    Comment: (NOTE) SARS-CoV-2 target nucleic acids are NOT DETECTED.  The SARS-CoV-2 RNA is generally detectable in upper respiratory specimens during the acute phase of infection. The lowest concentration of SARS-CoV-2 viral copies this assay can detect is 138 copies/mL. A negative result does not preclude SARS-Cov-2 infection and should not be used as the sole basis for treatment or other patient management decisions. A negative result may occur with  improper specimen collection/handling, submission of specimen other than nasopharyngeal swab, presence of viral mutation(s) within the areas targeted by this assay, and inadequate number of viral copies(<138 copies/mL). A negative result must be combined with clinical observations, patient history, and epidemiological information. The expected result is Negative.  Fact Sheet for Patients:  EntrepreneurPulse.com.au  Fact  Sheet for Healthcare Providers:  IncredibleEmployment.be  This test is no t yet approved or cleared by the [place] FDA and  has been authorized for detection and/or diagnosis of SARS-CoV-2 by FDA under an Emergency Use Authorization (EUA). This EUA will remain  in effect (meaning this test can be used) for the duration of the COVID-19 declaration under Section 564(b)(1) of the Act, 21 U.S.C.section 360bbb-3(b)(1), unless the authorization is terminated  or revoked sooner.       Influenza A by PCR NEGATIVE NEGATIVE Final   Influenza B by PCR NEGATIVE NEGATIVE Final    Comment: (NOTE) The Xpert Xpress SARS-CoV-2/FLU/RSV plus assay is intended as an aid in the diagnosis of influenza from Nasopharyngeal swab specimens and should not be used as a sole basis for treatment. Nasal washings and aspirates are unacceptable for Xpert Xpress SARS-CoV-2/FLU/RSV testing.  Fact Sheet for Patients: EntrepreneurPulse.com.au  Fact Sheet for Healthcare Providers: IncredibleEmployment.be  This test is not yet approved or cleared by the [place] FDA and has been authorized for detection and/or diagnosis of SARS-CoV-2 by FDA under an Emergency Use Authorization (EUA). This EUA will remain in effect (meaning this test can be used) for the duration of the COVID-19 declaration under Section 564(b)(1) of the Act, 21 U.S.C. section 360bbb-3(b)(1), unless the authorization is terminated or revoked.  Performed at [facility], [address] [place], [place] [address]   MRSA PCR Screening     Status: None   Collection Time: 12/05/20 10:32 AM   Specimen: Nasopharyngeal  Result Value Ref Range Status   MRSA by PCR NEGATIVE NEGATIVE Final    Comment:        The GeneXpert MRSA Assay (FDA approved for NASAL specimens only), is one component of a comprehensive MRSA colonization surveillance program. It is not intended to  diagnose MRSA infection nor to guide or monitor treatment for MRSA infections. Performed at [facility], [address], [place], [place] [address]      Radiology Studies: No results found.  Scheduled Meds: . amLODipine  10 mg Oral Daily  . atorvastatin  20 mg Oral QPM  . Chlorhexidine Gluconate Cloth  6 each Topical Daily  . cholestyramine light  4 g Oral BID  . epoetin (EPOGEN/PROCRIT) injection  4,000 Units Intravenous Q T,Th,Sa-HD  . furosemide  80 mg Intravenous Daily  . gabapentin  100 mg Oral QPM  . heparin  5,000 Units Subcutaneous Q8H  . hydrALAZINE  50 mg Oral Q8H  . hydrOXYzine  25 mg Oral Daily  . insulin aspart  0-5 Units Subcutaneous QHS  . insulin aspart  0-9 Units Subcutaneous  TID WC  . lipase/protease/amylase  36,000 Units Oral TID AC  . naphazoline-glycerin  1-2 drop Both Eyes BID  . pantoprazole  40 mg Oral Daily  . sevelamer carbonate  800 mg Oral TID WC  . cyanocobalamin  1,000 mcg Oral Daily   Continuous Infusions:   LOS: 5 days   Time spent: 40 minutes. More than 50% of the time was spent in counseling/coordination of care  Lorella Nimrod, MD [facility]  If 7PM-7AM, please contact night-coverage [email or web address]  12/10/2020, 5:13 PM   This record has been created using Systems analyst. Errors have been sought and corrected,but may not always be located. Such creation errors do not reflect on the standard of care.

## 2020-12-10 NOTE — Progress Notes (Signed)
[facility], [place] 12/10/20  Subjective:   LOS: 5  Patient resting in bed eating breakfast Denies nausea Denies shortness of breath States her swelling has improved    Objective:  Vital signs in last 24 hours:  Temp:  [97.6 F (36.4 C)-98 F (36.7 C)] 97.8 F (36.6 C) (03/21 1205) Pulse Rate:  [58-63] 58 (03/21 1205) Resp:  [16-18] 18 (03/21 1205) BP: (126-149)/(49-69) 140/51 (03/21 1205) SpO2:  [93 %-100 %] 100 % (03/21 1205)  Weight change:  Filed Weights   12/07/20 0412 12/08/20 0016 12/09/20 0247  Weight: 88.2 kg 86.2 kg 83.5 kg    Intake/Output:    Intake/Output Summary (Last 24 hours) at 12/10/2020 1503 Last data filed at 12/10/2020 0400 Gross per 24 hour  Intake 360 ml  Output --  Net 360 ml     Physical Exam: General:  In no acute distress or pain  HEENT  moist oral mucous membranes  Pulm/lungs  Lungs clear, on 2L O2, respiration symmetrical,unlabored  CVS/Heart  S1S2,no rubs or gallops  Abdomen:   Soft,non distended  Extremities:  1+bilateral lower extremity edema  Neurologic:  Oriented x3   Skin:  No acute lesions or rashes  Access:  Left IJ PermCath       Basic Metabolic Panel:  Recent Labs  Lab 12/04/20 1601 12/05/20 0447 12/06/20 0530 12/07/20 0440 12/08/20 0425  NA 141 141 142 140 139  K 3.0* 3.4* 3.9 3.8 4.3  CL 93* 93* 91* 93* 93*  CO2 37* 37* 38* 37* 34*  GLUCOSE 46*   51* 93 72 71 51*  BUN 14 15 17 10 15   CREATININE 4.33* 4.71* 5.25* 4.00* 4.51*  CALCIUM 8.1* 7.9* 8.1* 8.4* 8.6*  MG  --  1.8  --   --   --      CBC: Recent Labs  Lab 12/04/20 1601 12/05/20 0447 12/06/20 0530 12/07/20 0440 12/08/20 0425  WBC 3.9* 4.0 5.1 4.8 7.4  NEUTROABS 1.5*  --   --   --   --   HGB 7.1* 6.6* 7.9* 7.9* 8.6*  HCT 21.7* 19.6* 22.9* 23.6* 27.3*  MCV 82.2 80.7 81.8 83.7 85.8  PLT 93* 82* 78* 76* 86*      Lab Results  Component Value Date   HEPBSAG NON REACTIVE 12/06/2020   HEPBIGM  06/12/2010     NEGATIVE (NOTE) High levels of Hepatitis B Core IgM antibody are detectable during the acute stage of Hepatitis B. This antibody is used to differentiate current from past HBV infection.       Microbiology:  Recent Results (from the past 240 hour(s))  Resp Panel by RT-PCR (Flu A&B, Covid) Nasopharyngeal Swab     Status: None   Collection Time: 12/04/20  4:55 PM   Specimen: Nasopharyngeal Swab; Nasopharyngeal(NP) swabs in vial transport medium  Result Value Ref Range Status   SARS Coronavirus 2 by RT PCR NEGATIVE NEGATIVE Final    Comment: (NOTE) SARS-CoV-2 target nucleic acids are NOT DETECTED.  The SARS-CoV-2 RNA is generally detectable in upper respiratory specimens during the acute phase of infection. The lowest concentration of SARS-CoV-2 viral copies this assay can detect is 138 copies/mL. A negative result does not preclude SARS-Cov-2 infection and should not be used as the sole basis for treatment or other patient management decisions. A negative result may occur with  improper specimen collection/handling, submission of specimen other than nasopharyngeal swab, presence of viral mutation(s) within the areas targeted by this assay, and  inadequate number of viral copies(<138 copies/mL). A negative result must be combined with clinical observations, patient history, and epidemiological information. The expected result is Negative.  Fact Sheet for Patients:  EntrepreneurPulse.com.au  Fact Sheet for Healthcare Providers:  IncredibleEmployment.be  This test is no t yet approved or cleared by the [place] FDA and  has been authorized for detection and/or diagnosis of SARS-CoV-2 by FDA under an Emergency Use Authorization (EUA). This EUA will remain  in effect (meaning this test can be used) for the duration of the COVID-19 declaration under Section 564(b)(1) of the Act, 21 U.S.C.section 360bbb-3(b)(1), unless the authorization is  terminated  or revoked sooner.       Influenza A by PCR NEGATIVE NEGATIVE Final   Influenza B by PCR NEGATIVE NEGATIVE Final    Comment: (NOTE) The Xpert Xpress SARS-CoV-2/FLU/RSV plus assay is intended as an aid in the diagnosis of influenza from Nasopharyngeal swab specimens and should not be used as a sole basis for treatment. Nasal washings and aspirates are unacceptable for Xpert Xpress SARS-CoV-2/FLU/RSV testing.  Fact Sheet for Patients: EntrepreneurPulse.com.au  Fact Sheet for Healthcare Providers: IncredibleEmployment.be  This test is not yet approved or cleared by the [place] FDA and has been authorized for detection and/or diagnosis of SARS-CoV-2 by FDA under an Emergency Use Authorization (EUA). This EUA will remain in effect (meaning this test can be used) for the duration of the COVID-19 declaration under Section 564(b)(1) of the Act, 21 U.S.C. section 360bbb-3(b)(1), unless the authorization is terminated or revoked.  Performed at [facility], [address] [place], [place] [address]   MRSA PCR Screening     Status: None   Collection Time: 12/05/20 10:32 AM   Specimen: Nasopharyngeal  Result Value Ref Range Status   MRSA by PCR NEGATIVE NEGATIVE Final    Comment:        The GeneXpert MRSA Assay (FDA approved for NASAL specimens only), is one component of a comprehensive MRSA colonization surveillance program. It is not intended to diagnose MRSA infection nor to guide or monitor treatment for MRSA infections. Performed at [facility] [address] [place], [place] [address]     Coagulation Studies: No results for input(s): LABPROT, INR in the last 72 hours.  Urinalysis: No results for input(s): COLORURINE, LABSPEC, PHURINE, GLUCOSEU, HGBUR, BILIRUBINUR, KETONESUR, PROTEINUR, UROBILINOGEN, NITRITE, LEUKOCYTESUR in the last 72 hours.  Invalid input(s): APPERANCEUR    Imaging: No  results found.   Medications:     amLODipine  10 mg Oral Daily   atorvastatin  20 mg Oral QPM   Chlorhexidine Gluconate Cloth  6 each Topical Daily   cholestyramine light  4 g Oral BID   epoetin (EPOGEN/PROCRIT) injection  4,000 Units Intravenous Q T,Th,Sa-HD   furosemide  80 mg Intravenous Daily   gabapentin  100 mg Oral QPM   heparin  5,000 Units Subcutaneous Q8H   hydrALAZINE  50 mg Oral Q8H   hydrOXYzine  25 mg Oral Daily   insulin aspart  0-5 Units Subcutaneous QHS   insulin aspart  0-9 Units Subcutaneous TID WC   lipase/protease/amylase  36,000 Units Oral TID AC   naphazoline-glycerin  1-2 drop Both Eyes BID   pantoprazole  40 mg Oral Daily   sevelamer carbonate  800 mg Oral TID WC   cyanocobalamin  1,000 mcg Oral Daily   calcium carbonate, diphenhydrAMINE, HYDROcodone-acetaminophen, meclizine, sevelamer carbonate  Assessment/ Plan:  60 y.o. female with end-stage renal disease, on chronic diastolic  CHF, diabetes type 2, thoracic discitis T7-8 completed treatment jan 2022 was admitted on 12/04/2020 for  Principal Problem:   Adjustment disorder with mixed anxiety and depressed mood Active Problems:   End-stage renal disease on hemodialysis (HCC)   HTN (hypertension)   Hypoglycemia associated with diabetes ([place])   Hypokalemia   Sinus bradycardia   Severe anemia   Anemia in ESRD (end-stage renal disease) (HCC)   Fluid overload   Chronic diastolic CHF (congestive heart failure) (HCC)  Chronic pulmonary edema [J81.1] Anasarca [R60.1] ESRD (end stage renal disease) ([place]) [N18.6] Fluid overload [E87.70] Ascites [R18.8] Severe anemia [D64.9]  [facility] [place] dialysis/TTS 1/EDW 83.5 kg/left IJ PermCath/  #. ESRD with anasarca Will receive dialysis tomorrow Next schedule treatment on Thursday  #. Anemia of CKD  Lab Results  Component Value Date   HGB 8.6 (L) 12/08/2020   Continue Epogen 4000 units with HD    #. Secondary hyperparathyroidism of  renal origin N 25.81      Component Value Date/Time   PTH 357 (H) 10/05/2018 1241   Lab Results  Component Value Date   PHOS 3.9 09/18/2020   PHOS 4.0 09/18/2020   On Sevelamer 800 mg TID   #. Diabetes type 2 with CKD Hemoglobin A1C (%)  Date Value  07/19/2013 9.3 (H)   Hgb A1c MFr Bld (%)  Date Value  12/07/2020 4.6 (L)    Diabetic management per primary team   LOS: [place] 3/21/20223:03 PM  [place], [place]

## 2020-12-11 DIAGNOSIS — F4323 Adjustment disorder with mixed anxiety and depressed mood: Secondary | ICD-10-CM | POA: Diagnosis not present

## 2020-12-11 LAB — CBC
HCT: 22.8 % — ABNORMAL LOW (ref 36.0–46.0)
Hemoglobin: 7.3 g/dL — ABNORMAL LOW (ref 12.0–15.0)
MCH: 27.1 pg (ref 26.0–34.0)
MCHC: 32 g/dL (ref 30.0–36.0)
MCV: 84.8 fL (ref 80.0–100.0)
Platelets: 104 10*3/uL — ABNORMAL LOW (ref 150–400)
RBC: 2.69 MIL/uL — ABNORMAL LOW (ref 3.87–5.11)
RDW: 19.9 % — ABNORMAL HIGH (ref 11.5–15.5)
WBC: 3.1 10*3/uL — ABNORMAL LOW (ref 4.0–10.5)
nRBC: 0 % (ref 0.0–0.2)

## 2020-12-11 LAB — RENAL FUNCTION PANEL
Albumin: 2.6 g/dL — ABNORMAL LOW (ref 3.5–5.0)
Anion gap: 9 (ref 5–15)
BUN: 17 mg/dL (ref 8–23)
CO2: 40 mmol/L — ABNORMAL HIGH (ref 22–32)
Calcium: 8.3 mg/dL — ABNORMAL LOW (ref 8.9–10.3)
Chloride: 91 mmol/L — ABNORMAL LOW (ref 98–111)
Creatinine, Ser: 3.92 mg/dL — ABNORMAL HIGH (ref 0.44–1.00)
GFR, Estimated: 12 mL/min — ABNORMAL LOW (ref >60)
Glucose, Bld: 93 mg/dL (ref 70–99)
Phosphorus: 2.3 mg/dL — ABNORMAL LOW (ref 2.5–4.6)
Potassium: 3.8 mmol/L (ref 3.5–5.1)
Sodium: 140 mmol/L (ref 135–145)

## 2020-12-11 LAB — GLUCOSE, CAPILLARY
Glucose-Capillary: 45 mg/dL — ABNORMAL LOW (ref 70–99)
Glucose-Capillary: 94 mg/dL (ref 70–99)
Glucose-Capillary: 81 mg/dL (ref 70–99)
Glucose-Capillary: 71 mg/dL (ref 70–99)

## 2020-12-11 MED ORDER — DEXTROSE 50 % IV SOLN
INTRAVENOUS | Status: AC
  Administered 2020-12-11: 50 mL
  Filled 2020-12-11: qty 50

## 2020-12-11 MED ORDER — ALBUMIN HUMAN 25 % IV SOLN
25.0000 g | INTRAVENOUS | Status: DC
  Administered 2020-12-13 – 2020-12-15 (×2): 25 g via INTRAVENOUS
  Filled 2020-12-11 (×2): qty 100

## 2020-12-11 NOTE — Progress Notes (Signed)
[name] MRN: [id number] DOB: June 11, 1961   Cancelled Treatment:    Reason Eval/Treat Not Completed: Patient at procedure or test/unavailable: Per nursing pt at HD and unavailable for PT services. Will attempt to see pt at a future date/time as medically appropriate.     Linus Salmons PT, DPT 12/11/20, 3:12 PM

## 2020-12-11 NOTE — NC FL2 (Addendum)
[place] LEVEL OF CARE SCREENING TOOL     IDENTIFICATION  Patient Name: [name] Birthdate: December 15, 1960 Sex: female Admission Date (Current Location): 12/04/2020  [place] and [place] Number:  Engineering geologist and Address:  [facility], [address] [place], [place] [address]      Provider Number: [id number]  Attending Physician Name and Address:  Lorella Nimrod, MD  Relative Name and Phone Number:       Current Level of Care: Hospital Recommended Level of Care: [place] Prior Approval Number:    Date Approved/Denied:   PASRR Number: [id number]  Discharge Plan: SNF    Current Diagnoses: Patient Active Problem List   Diagnosis Date Noted  . Chronic diastolic CHF (congestive heart failure) ([place]) 12/04/2020  . Bradycardia 09/17/2020  . Advanced care planning/counseling discussion   . Discitis of thoracic region   . Fluid overload 08/10/2020  . HLD (hyperlipidemia) 08/10/2020  . Chest pain 08/10/2020  . CAD (coronary artery disease) 08/10/2020  . GERD (gastroesophageal reflux disease) 08/10/2020  . Obesity (BMI 30-39.9) 06/06/2020  . Anemia in ESRD (end-stage renal disease) ([place]) 05/31/2020  . Type 1 diabetes mellitus ([place]) 05/31/2020  . End-stage renal disease ([place]) 05/31/2020  . Hypertensive disorder 05/31/2020  . Arm pain 05/21/2020  . Cellulitis of right leg 05/02/2020  . ESRD (end stage renal disease) ([place]) 04/13/2020  . SOBOE (shortness of breath on exertion) 02/08/2020  . Hematoma of arm, left, subsequent encounter 11/24/2019  . Respiratory failure ([place])   . Shortness of breath   . Complication of arteriovenous dialysis fistula 11/13/2019  . PVD (peripheral vascular disease) ([place]) 11/13/2019  . Severe anemia 11/13/2019  . Pressure injury of skin 11/13/2019  . ESRD on hemodialysis ([place])   . Hemorrhagic shock ([place]) 11/11/2019  . Leg pain 10/02/2019  . CAP (community acquired pneumonia)  10/01/2019  . Atherosclerosis of native arteries of extremity with intermittent claudication ([place]) 05/01/2019  . Right-sided headache   . COPD (chronic obstructive pulmonary disease) ([place]) 10/06/2018  . Syncope 10/04/2018  . Symptomatic anemia 06/18/2018  . Gastroparesis due to DM ([place]) 01/18/2018  . Complication of vascular access for dialysis 12/04/2017  . Osteomyelitis ([place]) 09/30/2017  . Carotid stenosis 06/18/2017  . Bilateral carotid artery stenosis 06/16/2017  . Benign essential HTN 05/19/2017  . CKD (chronic kidney disease) stage 5, GFR less than 15 ml/min (HCC) 05/19/2017  . Hyperlipidemia, mixed 05/19/2017  . Shortness of breath 05/04/2017  . Rash, skin 12/16/2016  . Cellulitis of lower extremity 07/29/2016  . Chronic venous insufficiency 07/29/2016  . Lymphedema 07/29/2016  . TIA (transient ischemic attack) 04/21/2016  . Altered mental status 04/08/2016  . Hyperammonemia ([place]) 04/08/2016  . Elevated troponin 04/08/2016  . Depression 04/08/2016  . Depression, major, recurrent, severe with psychosis ([place]) 04/08/2016  . Blood in stool   . Intractable cyclical vomiting with nausea   . Reflux esophagitis   . Gastritis   . Generalized abdominal pain   . Uncontrollable vomiting   . Major depressive disorder, recurrent episode, moderate ([place]) 03/15/2016  . Adjustment disorder with mixed anxiety and depressed mood 03/15/2016  . Malnutrition of moderate degree 12/01/2015  . Renal mass   . Dyspnea   . Acute renal failure ([place])   . Respiratory failure ([place])   . High temperature 11/14/2015  . Encounter for central line placement   . Encounter for orogastric (OG) tube placement   . Nausea 11/12/2015  . Hyperkalemia 10/03/2015  .  Diarrhea, unspecified 07/22/2015  . Pneumonia 05/21/2015  . Hypoglycemia 04/24/2015  . Unresponsiveness 04/24/2015  . Sinus bradycardia 04/24/2015  . Hypothermia 04/24/2015  . Acute respiratory failure ([place]) 04/24/2015  . Acute on chronic  diastolic CHF (congestive heart failure) ([place]) 04/05/2015  . Diabetic gastroparesis ([place]) 04/05/2015  . Hypokalemia 04/05/2015  . Generalized weakness 04/05/2015  . Acute pulmonary edema ([place]) 04/03/2015  . Nausea and vomiting 04/03/2015  . Hypoglycemia associated with diabetes ([place]) 04/03/2015  . Anemia of chronic disease 04/03/2015  . Secondary hyperparathyroidism ([place]) 04/03/2015  . Pressure ulcer 04/02/2015  . Acute respiratory failure with hypoxia ([place]) 04/01/2015  . Adjustment disorder with anxiety 03/14/2015  . Somatic symptom disorder, mild 03/08/2015  . Coronary artery disease involving native coronary artery of native heart without angina pectoris   . Nausea & vomiting 03/06/2015  . Abdominal pain 03/06/2015  . Type 2 diabetes mellitus with hypoglycemia without coma, without long-term current use of insulin ([place]) 03/06/2015  . HTN (hypertension) 03/06/2015  . Gastroparesis 02/24/2015  . Pleural effusion 02/19/2015  . Bacteremia due to Enterococcus 02/19/2015  . End-stage renal disease on hemodialysis ([place]) 02/19/2015  . Diarrhea 08/01/2014    Orientation RESPIRATION BLADDER Height & Weight     Time,Situation,Place,Self  Normal Continent Weight: 184 lb 1.4 oz (83.5 kg) Height:  5\' 5"  (165.1 cm)  BEHAVIORAL SYMPTOMS/MOOD NEUROLOGICAL BOWEL NUTRITION STATUS      Incontinent Diet (renal with fluid restriction 1200 mL)  AMBULATORY STATUS COMMUNICATION OF NEEDS Skin   Extensive Assist Verbally Other (Comment) (open wound right thigh, foam dressing,)                       Personal Care Assistance Level of Assistance  Bathing,Dressing,Feeding Bathing Assistance: Maximum assistance Feeding assistance: Independent Dressing Assistance: Maximum assistance     Functional Limitations Info  Sight,Hearing,Speech Sight Info: Adequate Hearing Info: Adequate Speech Info: Adequate    SPECIAL CARE FACTORS FREQUENCY  PT (By licensed PT),OT (By licensed OT)     PT  Frequency: 5x OT Frequency: 5x            Contractures Contractures Info: Not present    Additional Factors Info  Code Status,Allergies,Isolation Precautions Code Status Info: DNR Allergies Info: Ace Inhibitors, Ativan (Lorazepam), Compazine (Prochlorperazine Edisylate), Sumatriptan Succinate, Zofran (Ondansetron), Losartan, Prochlorperazine, Reglan (Metoclopramide), Scopolamine, Tape, Tapentadol, Ultrasound Gel     Isolation Precautions Info: ESBL--contact     Current Medications (12/11/2020):  This is the current hospital active medication list Current Facility-Administered Medications  Medication Dose Route Frequency Provider Last Rate Last Admin  . albumin human 25 % solution 25 g  25 g Intravenous Q T,Th,Sa-HD Lateef, Mun[place]or, MD      . amLODipine (NORVASC) tablet 10 mg  10 mg Oral Daily Tu, Ching T, DO   10 mg at 12/10/20 0903  . atorvastatin (LIPITOR) tablet 20 mg  20 mg Oral QPM Tu, Ching T, DO   20 mg at 12/10/20 1702  . calcium carbonate (TUMS - dosed in mg elemental calcium) chewable tablet 500 mg of elemental calcium  500 mg of elemental calcium Oral Q6H PRN Tu, Ching T, DO      . Chlorhexidine Gluconate Cloth 2 % PADS 6 each  6 each Topical Daily Max Sane, MD   6 each at 12/10/20 1835  . cholestyramine light (PREVALITE) packet 4 g  4 g Oral BID Tu, Ching T, DO   4 g at 12/09/20 2100  . diphenhydrAMINE (BENADRYL)  injection 25 mg  25 mg Intravenous Q6H PRN Lang Snow, NP   25 mg at 12/07/20 1706  . epoetin alfa (EPOGEN) injection 4,000 Units  4,000 Units Intravenous Q T,Th,Sa-HD Murlean Iba, MD   4,000 Units at 12/08/20 1121  . furosemide (LASIX) injection 80 mg  80 mg Intravenous Daily Tu, Ching T, DO   80 mg at 12/10/20 0902  . gabapentin (NEURONTIN) capsule 100 mg  100 mg Oral QPM Tu, Ching T, DO   100 mg at 12/10/20 1702  . heparin injection 5,000 Units  5,000 Units Subcutaneous Q8H Tu, Ching T, DO   5,000 Units at 12/10/20 2330  . hydrALAZINE  (APRESOLINE) tablet 50 mg  50 mg Oral Q8H Tu, Ching T, DO   50 mg at 12/10/20 2331  . HYDROcodone-acetaminophen (NORCO/VICODIN) 5-325 MG per tablet 1 tablet  1 tablet Oral TID PRN Tu, Ching T, DO   1 tablet at 12/10/20 2331  . hydrOXYzine (ATARAX/VISTARIL) tablet 25 mg  25 mg Oral Daily Tu, Ching T, DO   25 mg at 12/10/20 0903  . insulin aspart (novoLOG) injection 0-5 Units  0-5 Units Subcutaneous QHS Manuella Ghazi, Vipul, MD      . insulin aspart (novoLOG) injection 0-9 Units  0-9 Units Subcutaneous TID WC Max Sane, MD   1 Units at 12/10/20 1709  . lipase/protease/amylase (CREON) capsule 36,000 Units  36,000 Units Oral TID AC Tu, Ching T, DO   36,000 Units at 12/10/20 1708  . meclizine (ANTIVERT) tablet 25 mg  25 mg Oral QID PRN Tu, Ching T, DO   25 mg at 12/05/20 1050  . naphazoline-glycerin (CLEAR EYES REDNESS) ophth solution 1-2 drop  1-2 drop Both Eyes BID Philis Pique, NP   1 drop at 12/10/20 2141  . pantoprazole (PROTONIX) EC tablet 40 mg  40 mg Oral Daily Tu, Ching T, DO   40 mg at 12/10/20 0902  . sevelamer carbonate (RENVELA) tablet 800 mg  800 mg Oral TID WC Lockie Mola B, RPH   800 mg at 12/10/20 1702  . sevelamer carbonate (RENVELA) tablet 800 mg  800 mg Oral Daily PRN Benita Gutter, RPH      . vitamin B-12 (CYANOCOBALAMIN) tablet 1,000 mcg  1,000 mcg Oral Daily Tu, Ching T, DO   1,000 mcg at 12/10/20 0902     Discharge Medications: Please see discharge summary for a list of discharge medications.  Relevant Imaging Results:  Relevant Lab Results:   Additional Information SSN:[id number]  HD: T, TH, S-- [place] location  Bridget A Cobb, LCSW

## 2020-12-11 NOTE — Progress Notes (Signed)
[facility], [place] 12/11/20  Subjective:   LOS: 6  Patient sleeping in bed Easily aroused and able to answer questions States she ate breakfast and tolerated it well Denies shortness of breath    Objective:  Vital signs in last 24 hours:  Temp:  [97.5 F (36.4 C)-98.7 F (37.1 C)] 98.4 F (36.9 C) (03/22 1207) Pulse Rate:  [55-61] 55 (03/22 1207) Resp:  [15-16] 15 (03/22 1207) BP: (114-129)/(50-58) 129/58 (03/22 1207) SpO2:  [92 %-100 %] 100 % (03/22 1207) Weight:  [88 kg] 88 kg (03/22 1300)  Weight change:  Filed Weights   12/08/20 0016 12/09/20 0247 12/11/20 1300  Weight: 86.2 kg 83.5 kg 88 kg    Intake/Output:   No intake or output data in the 24 hours ending 12/11/20 1424   Physical Exam: General:  No acute distress or pain  HEENT  moist oral mucous membranes  Pulm/lungs  Lungs clear, on 2L O2, respiration symmetrical,unlabored  CVS/Heart  S1S2,no rubs or gallops  Abdomen:   Soft,non distended  Extremities:  1+bilateral lower extremity edema  Neurologic:  Oriented x3   Skin:  No acute lesions or rashes  Access:  Left IJ PermCath       Basic Metabolic Panel:  Recent Labs  Lab 12/04/20 1601 12/05/20 0447 12/06/20 0530 12/07/20 0440 12/08/20 0425  NA 141 141 142 140 139  K 3.0* 3.4* 3.9 3.8 4.3  CL 93* 93* 91* 93* 93*  CO2 37* 37* 38* 37* 34*  GLUCOSE 46*  51* 93 72 71 51*  BUN 14 15 17 10 15   CREATININE 4.33* 4.71* 5.25* 4.00* 4.51*  CALCIUM 8.1* 7.9* 8.1* 8.4* 8.6*  MG  --  1.8  --   --   --      CBC: Recent Labs  Lab 12/04/20 1601 12/05/20 0447 12/06/20 0530 12/07/20 0440 12/08/20 0425  WBC 3.9* 4.0 5.1 4.8 7.4  NEUTROABS 1.5*  --   --   --   --   HGB 7.1* 6.6* 7.9* 7.9* 8.6*  HCT 21.7* 19.6* 22.9* 23.6* 27.3*  MCV 82.2 80.7 81.8 83.7 85.8  PLT 93* 82* 78* 76* 86*      Lab Results  Component Value Date   HEPBSAG NON REACTIVE 12/06/2020   HEPBIGM  06/12/2010    NEGATIVE (NOTE) High levels of  Hepatitis B Core IgM antibody are detectable during the acute stage of Hepatitis B. This antibody is used to differentiate current from past HBV infection.       Microbiology:  Recent Results (from the past 240 hour(s))  Resp Panel by RT-PCR (Flu A&B, Covid) Nasopharyngeal Swab     Status: None   Collection Time: 12/04/20  4:55 PM   Specimen: Nasopharyngeal Swab; Nasopharyngeal(NP) swabs in vial transport medium  Result Value Ref Range Status   SARS Coronavirus 2 by RT PCR NEGATIVE NEGATIVE Final    Comment: (NOTE) SARS-CoV-2 target nucleic acids are NOT DETECTED.  The SARS-CoV-2 RNA is generally detectable in upper respiratory specimens during the acute phase of infection. The lowest concentration of SARS-CoV-2 viral copies this assay can detect is 138 copies/mL. A negative result does not preclude SARS-Cov-2 infection and should not be used as the sole basis for treatment or other patient management decisions. A negative result may occur with  improper specimen collection/handling, submission of specimen other than nasopharyngeal swab, presence of viral mutation(s) within the areas targeted by this assay, and inadequate number of viral copies(<138 copies/mL). A negative  result must be combined with clinical observations, patient history, and epidemiological information. The expected result is Negative.  Fact Sheet for Patients:  EntrepreneurPulse.com.au  Fact Sheet for Healthcare Providers:  IncredibleEmployment.be  This test is no t yet approved or cleared by the [place] FDA and  has been authorized for detection and/or diagnosis of SARS-CoV-2 by FDA under an Emergency Use Authorization (EUA). This EUA will remain  in effect (meaning this test can be used) for the duration of the COVID-19 declaration under Section 564(b)(1) of the Act, 21 U.S.C.section 360bbb-3(b)(1), unless the authorization is terminated  or revoked sooner.        Influenza A by PCR NEGATIVE NEGATIVE Final   Influenza B by PCR NEGATIVE NEGATIVE Final    Comment: (NOTE) The Xpert Xpress SARS-CoV-2/FLU/RSV plus assay is intended as an aid in the diagnosis of influenza from Nasopharyngeal swab specimens and should not be used as a sole basis for treatment. Nasal washings and aspirates are unacceptable for Xpert Xpress SARS-CoV-2/FLU/RSV testing.  Fact Sheet for Patients: EntrepreneurPulse.com.au  Fact Sheet for Healthcare Providers: IncredibleEmployment.be  This test is not yet approved or cleared by the [place] FDA and has been authorized for detection and/or diagnosis of SARS-CoV-2 by FDA under an Emergency Use Authorization (EUA). This EUA will remain in effect (meaning this test can be used) for the duration of the COVID-19 declaration under Section 564(b)(1) of the Act, 21 U.S.C. section 360bbb-3(b)(1), unless the authorization is terminated or revoked.  Performed at [facility], [address] [place], [place] [address]   MRSA PCR Screening     Status: None   Collection Time: 12/05/20 10:32 AM   Specimen: Nasopharyngeal  Result Value Ref Range Status   MRSA by PCR NEGATIVE NEGATIVE Final    Comment:        The GeneXpert MRSA Assay (FDA approved for NASAL specimens only), is one component of a comprehensive MRSA colonization surveillance program. It is not intended to diagnose MRSA infection nor to guide or monitor treatment for MRSA infections. Performed at [facility], [address] [place], [place] [address]     Coagulation Studies: No results for input(s): LABPROT, INR in the last 72 hours.  Urinalysis: No results for input(s): COLORURINE, LABSPEC, PHURINE, GLUCOSEU, HGBUR, BILIRUBINUR, KETONESUR, PROTEINUR, UROBILINOGEN, NITRITE, LEUKOCYTESUR in the last 72 hours.  Invalid input(s): APPERANCEUR    Imaging: No results found.   Medications:   .  albumin human     . amLODipine  10 mg Oral Daily  . atorvastatin  20 mg Oral QPM  . Chlorhexidine Gluconate Cloth  6 each Topical Daily  . cholestyramine light  4 g Oral BID  . epoetin (EPOGEN/PROCRIT) injection  4,000 Units Intravenous Q T,Th,Sa-HD  . furosemide  80 mg Intravenous Daily  . gabapentin  100 mg Oral QPM  . heparin  5,000 Units Subcutaneous Q8H  . hydrALAZINE  50 mg Oral Q8H  . hydrOXYzine  25 mg Oral Daily  . insulin aspart  0-5 Units Subcutaneous QHS  . insulin aspart  0-9 Units Subcutaneous TID WC  . lipase/protease/amylase  36,000 Units Oral TID AC  . naphazoline-glycerin  1-2 drop Both Eyes BID  . pantoprazole  40 mg Oral Daily  . sevelamer carbonate  800 mg Oral TID WC  . cyanocobalamin  1,000 mcg Oral Daily   calcium carbonate, diphenhydrAMINE, HYDROcodone-acetaminophen, meclizine, sevelamer carbonate  Assessment/ Plan:  60 y.o. female with end-stage renal disease, on chronic diastolic CHF, diabetes  type 2, thoracic discitis T7-8 completed treatment jan 2022 was admitted on 12/04/2020 for  Principal Problem:   Adjustment disorder with mixed anxiety and depressed mood Active Problems:   End-stage renal disease on hemodialysis (HCC)   HTN (hypertension)   Hypoglycemia associated with diabetes ([place])   Hypokalemia   Sinus bradycardia   Severe anemia   Anemia in ESRD (end-stage renal disease) (HCC)   Fluid overload   Chronic diastolic CHF (congestive heart failure) (HCC)  Chronic pulmonary edema [J81.1] Anasarca [R60.1] ESRD (end stage renal disease) ([place]) [N18.6] Fluid overload [E87.70] Ascites [R18.8] Severe anemia [D64.9]  [facility] dialysis/TTS 1/EDW 83.5 kg/left IJ PermCath/  #. ESRD with anasarca Scheduled to receive dialysis today UF goal- 3L Next schedule treatment on Thursday D/c planning-pt needs assistance with transportation to dialysis treatments. Will need case management to assist with this request  #. Anemia of CKD  Lab Results   Component Value Date   HGB 8.6 (L) 12/08/2020   Epogen 4000 units with dialysis    #. Secondary hyperparathyroidism of renal origin N 25.81      Component Value Date/Time   PTH 357 (H) 10/05/2018 1241   Lab Results  Component Value Date   PHOS 3.9 09/18/2020   PHOS 4.0 09/18/2020   On Sevelamer 800 mg with meals   #. Diabetes type 2 with CKD Hemoglobin A1C (%)  Date Value  07/19/2013 9.3 (H)   Hgb A1c MFr Bld (%)  Date Value  12/07/2020 4.6 (L)    Diabetic management per primary team   LOS: [place] 3/22/20222:24 PM  [place], [place]

## 2020-12-11 NOTE — Progress Notes (Signed)
PROGRESS NOTE    [name]  YTK:[id number] DOB: June 25, 1961 DOA: 12/04/2020 PCP: Denton Lank, MD   Brief Narrative: Taken from prior notes. [name] a 60 y.o.femalewith medical history significant forESRD on HD Tu/Thurs/Sat, DM, Chronic diastolic HF, CAD, PVD, recent T7-T8 discitis/osteomyelitis tx with 6 week course of IV vancomycin and ceftazidime ending on 10/10/20, atherosclerotic occlusive disease b/l UE s/p right brachial artery angioplasty on 11/27/20 who presents with concerns of worsening abdominal and LE edema and pain. Remained volume up, although improving.  Volume is being managed with dialysis.  Subjective: Patient was feeling weak and some backache.  She does not want to go back to [facility] facility, would [facility].  Assessment & Plan:   Principal Problem:   Adjustment disorder with mixed anxiety and depressed mood Active Problems:   End-stage renal disease on hemodialysis (HCC)   HTN (hypertension)   Hypoglycemia associated with diabetes (HCC)   Hypokalemia   Sinus bradycardia   Severe anemia   Anemia in ESRD (end-stage renal disease) (HCC)   Fluid overload   Chronic diastolic CHF (congestive heart failure) (HCC)  Acute hypoxic respiratory failure secondary to pulmonary edema.  Resolved.  Now saturating well on room air -Volume is mostly being controlled with dialysis.  Anasarca/chronic diastolic heart failure. -Continue with dialysis to control extra volume, improving but still appears up from her baseline. -Continue with IV Lasix  Generalized weakness/multiple admissions.  Patient has more than 4 admissions in the past 68-month per chart review. Palliative care was consulted-wants to continue with dialysis and will continue with palliative care as an outpatient. -PT recommending SNF placement -TOC consult-TOC is looking for [facility] as she does not go back to [facility]. -Overall poor  prognosis.  Hypoglycemia with history of type 2 diabetes.  Secondary to poor p.o. intake.  A1c of 4.6. CBG decreased to 45 this morning which improved with 1 amp of dextrose and Sprite. -Continue to monitor -Encourage p.o. intake  Hypertension.  Blood pressure within goal. -Continue home dose of amlodipine and hydralazine -Continue with IV Lasix  Anemia of chronic kidney disease.  S/p 1 unit of PRBC. Hemoglobin improved to 12.6  but all cell lines increased so may be some element of hemoconcentration.  No labs done for today. -Continue to monitor  Asymptomatic bradycardia. -Keep holding Coreg.  History of chronic depression/mood disorder.  Not on any medication at this time.  She was seen by Dr. Weber Cooks.  Stage I obesity. Estimated body mass index is 30.63 kg/m as calculated from the following:   Height as of this encounter: 5\' 5"  (1.651 m).   Weight as of this encounter: 83.5 kg.  Objective: Vitals:   12/10/20 1523 12/10/20 2001 12/10/20 2331 12/11/20 0755  BP: (!) 127/55 (!) 123/51 (!) 114/50 (!) 120/52  Pulse: (!) 56 61  (!) 58  Resp: 16 16  15   Temp: (!) 97.5 F (36.4 C) 97.7 F (36.5 C)  98.7 F (37.1 C)  TempSrc: Oral Oral    SpO2: 100% 92%  98%  Weight:      Height:       No intake or output data in the 24 hours ending 12/11/20 0807 Filed Weights   12/07/20 0412 12/08/20 0016 12/09/20 0247  Weight: 88.2 kg 86.2 kg 83.5 kg    Examination:  General.  Chronically ill-appearing lady, in no acute distress. Pulmonary.  Lungs clear bilaterally, normal respiratory effort. CV.  Regular rate and  rhythm, no JVD, rub or murmur. Abdomen.  Soft, nontender, nondistended, BS positive. CNS.  Alert and oriented x3.  No focal neurologic deficit. Extremities.  No edema, no cyanosis, pulses intact and symmetrical, signs of chronic venous stasis. Psychiatry.  Judgment and insight appears normal.  DVT prophylaxis: Heparin Code Status: DNR Family Communication: Discussed  with patient Disposition Plan:  Status is: Inpatient  Remains inpatient appropriate because:Inpatient level of care appropriate due to severity of illness   Dispo: The patient is from: Home              Anticipated d/c is to: SNF              Patient currently is not medically stable to d/c.   Difficult to place patient No              Level of care: Progressive Cardiac  All the records are reviewed and case discussed with Care Management/Social Worker. Management plans discussed with the patient, nursing and they are in agreement.  Consultants:   Nephrology  Procedures:  Antimicrobials:   Data Reviewed: I have personally reviewed following labs and imaging studies  CBC: Recent Labs  Lab 12/04/20 1601 12/05/20 0447 12/06/20 0530 12/07/20 0440 12/08/20 0425  WBC 3.9* 4.0 5.1 4.8 7.4  NEUTROABS 1.5*  --   --   --   --   HGB 7.1* 6.6* 7.9* 7.9* 8.6*  HCT 21.7* 19.6* 22.9* 23.6* 27.3*  MCV 82.2 80.7 81.8 83.7 85.8  PLT 93* 82* 78* 76* 86*   Basic Metabolic Panel: Recent Labs  Lab 12/04/20 1601 12/05/20 0447 12/06/20 0530 12/07/20 0440 12/08/20 0425  NA 141 141 142 140 139  K 3.0* 3.4* 3.9 3.8 4.3  CL 93* 93* 91* 93* 93*  CO2 37* 37* 38* 37* 34*  GLUCOSE 46*   51* 93 72 71 51*  BUN 14 15 17 10 15   CREATININE 4.33* 4.71* 5.25* 4.00* 4.51*  CALCIUM 8.1* 7.9* 8.1* 8.4* 8.6*  MG  --  1.8  --   --   --    GFR: Estimated Creatinine Clearance: 14 mL/min (A) (by C-G formula based on SCr of 4.51 mg/dL (H)). Liver Function Tests: Recent Labs  Lab 12/04/20 1601  AST 22  ALT <5  ALKPHOS 69  BILITOT 0.6  PROT 6.4*  ALBUMIN 2.8*   No results for input(s): LIPASE, AMYLASE in the last 168 hours. No results for input(s): AMMONIA in the last 168 hours. Coagulation Profile: No results for input(s): INR, PROTIME in the last 168 hours. Cardiac Enzymes: No results for input(s): CKTOTAL, CKMB, CKMBINDEX, TROPONINI in the last 168 hours. BNP (last 3 results) No  results for input(s): PROBNP in the last 8760 hours. HbA1C: No results for input(s): HGBA1C in the last 72 hours. CBG: Recent Labs  Lab 12/10/20 2052 12/10/20 2201 12/10/20 2218 12/10/20 2318 12/11/20 0756  GLUCAP 83 69* 77 77 45*   Lipid Profile: No results for input(s): CHOL, HDL, LDLCALC, TRIG, CHOLHDL, LDLDIRECT in the last 72 hours. Thyroid Function Tests: No results for input(s): TSH, T4TOTAL, FREET4, T3FREE, THYROIDAB in the last 72 hours. Anemia Panel: No results for input(s): VITAMINB12, FOLATE, FERRITIN, TIBC, IRON, RETICCTPCT in the last 72 hours. Sepsis Labs: Recent Labs  Lab 12/04/20 1655 12/04/20 1820  LATICACIDVEN 0.7 0.7    Recent Results (from the past 240 hour(s))  Resp Panel by RT-PCR (Flu A&B, Covid) Nasopharyngeal Swab     Status: None   Collection Time: 12/04/20  4:55 PM   Specimen: Nasopharyngeal Swab; Nasopharyngeal(NP) swabs in vial transport medium  Result Value Ref Range Status   SARS Coronavirus 2 by RT PCR NEGATIVE NEGATIVE Final    Comment: (NOTE) SARS-CoV-2 target nucleic acids are NOT DETECTED.  The SARS-CoV-2 RNA is generally detectable in upper respiratory specimens during the acute phase of infection. The lowest concentration of SARS-CoV-2 viral copies this assay can detect is 138 copies/mL. A negative result does not preclude SARS-Cov-2 infection and should not be used as the sole basis for treatment or other patient management decisions. A negative result may occur with  improper specimen collection/handling, submission of specimen other than nasopharyngeal swab, presence of viral mutation(s) within the areas targeted by this assay, and inadequate number of viral copies(<138 copies/mL). A negative result must be combined with clinical observations, patient history, and epidemiological information. The expected result is Negative.  Fact Sheet for Patients:  EntrepreneurPulse.com.au  Fact Sheet for Healthcare  Providers:  IncredibleEmployment.be  This test is no t yet approved or cleared by the [place] FDA and  has been authorized for detection and/or diagnosis of SARS-CoV-2 by FDA under an Emergency Use Authorization (EUA). This EUA will remain  in effect (meaning this test can be used) for the duration of the COVID-19 declaration under Section 564(b)(1) of the Act, 21 U.S.C.section 360bbb-3(b)(1), unless the authorization is terminated  or revoked sooner.       Influenza A by PCR NEGATIVE NEGATIVE Final   Influenza B by PCR NEGATIVE NEGATIVE Final    Comment: (NOTE) The Xpert Xpress SARS-CoV-2/FLU/RSV plus assay is intended as an aid in the diagnosis of influenza from Nasopharyngeal swab specimens and should not be used as a sole basis for treatment. Nasal washings and aspirates are unacceptable for Xpert Xpress SARS-CoV-2/FLU/RSV testing.  Fact Sheet for Patients: EntrepreneurPulse.com.au  Fact Sheet for Healthcare Providers: IncredibleEmployment.be  This test is not yet approved or cleared by the [place] FDA and has been authorized for detection and/or diagnosis of SARS-CoV-2 by FDA under an Emergency Use Authorization (EUA). This EUA will remain in effect (meaning this test can be used) for the duration of the COVID-19 declaration under Section 564(b)(1) of the Act, 21 U.S.C. section 360bbb-3(b)(1), unless the authorization is terminated or revoked.  Performed at [facility], [address] [place], [place] [address]   MRSA PCR Screening     Status: None   Collection Time: 12/05/20 10:32 AM   Specimen: Nasopharyngeal  Result Value Ref Range Status   MRSA by PCR NEGATIVE NEGATIVE Final    Comment:        The GeneXpert MRSA Assay (FDA approved for NASAL specimens only), is one component of a comprehensive MRSA colonization surveillance program. It is not intended to diagnose MRSA infection nor  to guide or monitor treatment for MRSA infections. Performed at [facility], [address] [place], [place] [address]      Radiology Studies: No results found.  Scheduled Meds:  amLODipine  10 mg Oral Daily   atorvastatin  20 mg Oral QPM   Chlorhexidine Gluconate Cloth  6 each Topical Daily   cholestyramine light  4 g Oral BID   epoetin (EPOGEN/PROCRIT) injection  4,000 Units Intravenous Q T,Th,Sa-HD   furosemide  80 mg Intravenous Daily   gabapentin  100 mg Oral QPM   heparin  5,000 Units Subcutaneous Q8H   hydrALAZINE  50 mg Oral Q8H   hydrOXYzine  25 mg Oral Daily   insulin  aspart  0-5 Units Subcutaneous QHS   insulin aspart  0-9 Units Subcutaneous TID WC   lipase/protease/amylase  36,000 Units Oral TID AC   naphazoline-glycerin  1-2 drop Both Eyes BID   pantoprazole  40 mg Oral Daily   sevelamer carbonate  800 mg Oral TID WC   cyanocobalamin  1,000 mcg Oral Daily   Continuous Infusions:  albumin human       LOS: 6 days   Time spent: 36 minutes. More than 50% of the time was spent in counseling/coordination of care  Lorella Nimrod, MD [facility]  If 7PM-7AM, please contact night-coverage [email or web address]  12/11/2020, 8:07 AM   This record has been created using Systems analyst. Errors have been sought and corrected,but may not always be located. Such creation errors do not reflect on the standard of care.

## 2020-12-11 NOTE — Progress Notes (Signed)
Results for [name], [name] (MRN [id number]) as of 12/11/2020 03:45  Ref. Range 12/10/2020 20:04 12/10/2020 20:52 12/10/2020 22:01 12/10/2020 22:18 12/10/2020 23:18  Glucose-Capillary Latest Ref Range: 70 - 99 mg/dL 65 (L) 83 69 (L) 77 77   Patient had an episode of hypoglycemia with BG of 65. Pt asymptomatic. Patient given 240cc of sprite. BG increased to 83. Patient called out at 2000 and asked for her BG to be checked because she "didn't feel right" after getting back in the bed from the [facility]. BG check was 69. Patient given 240cc of ginger ale and after 15 minutes BG increased to 77. Rechecked one hour later and still 77. Patient asymptomatic.

## 2020-12-11 NOTE — Progress Notes (Addendum)
Patient MEWS of yellow while in dialysis.   We will recheck full set of vital once back on the floor

## 2020-12-11 NOTE — Progress Notes (Signed)
Inpatient Diabetes Program Recommendations  AACE/ADA: New Consensus Statement on Inpatient Glycemic Control (2015)  Target Ranges:  Prepandial:   less than 140 mg/dL      Peak postprandial:   less than 180 mg/dL (1-2 hours)      Critically ill patients:  140 - 180 mg/dL   Results for DEHLIA, KILNER (MRN [id number]) as of 12/11/2020 08:57  Ref. Range 12/10/2020 08:16 12/10/2020 12:03 12/10/2020 17:03 12/10/2020 20:04 12/10/2020 20:52 12/10/2020 22:01 12/10/2020 22:18  Glucose-Capillary Latest Ref Range: 70 - 99 mg/dL 119 (H) 132 (H) 132 (H)  1 unit NOVOLOG  65 (L) 83 69 (L) 77   Results for JATORIA, KNEELAND (MRN [id number]) as of 12/11/2020 08:57  Ref. Range 12/11/2020 07:56  Glucose-Capillary Latest Ref Range: 70 - 99 mg/dL 45 (L)    Current Orders: Novolog Sensitive Correction Scale/ SSI (0-9 units) TID AC + HS    MD- Note Hypoglycemic events.  Please stop Novolog SSI for now  Would continue CBG checks TID AC + HS     --Will follow patient during hospitalization--  Wyn Quaker RN, MSN, CDE Diabetes Coordinator Inpatient Glycemic Control Team Team Pager: [phone] (8a-5p)

## 2020-12-11 NOTE — TOC Initial Note (Signed)
Transition of Care [facility]) - Initial/Assessment Note    Patient Details  Name: [name] MRN: [id number] Date of Birth: February 28, 1961  [facility]) CM/SW Contact:    Eileen Stanford, LCSW Phone Number: 12/11/2020, 9:40 AM  Clinical Narrative:     Pt is alert and oriented. CSW spoke with pt about SNF. Pt states she feels like she is going to throw up. Pt asked that CSW call her son to dicuss dc plans.   CSW called pt's son. Pt's son is agreeable for pt to go to rehab. Pt has been to [facility] in the past and it was a horrible experience. Pt's son states [facility] would be closest to him. Pt's son is agreeable to [facility]. Pt's son said to just run it by pt that that's what he suggested.   Pt drives herself to appointments and when she can't [facility] does. However, pt's son states they are having trouble with arranging transport through [facility] due to insurance. Son is working with Scientist, research (physical sciences) to get insurance documentation. Pt still sees Dr. Posey Pronto at [facility]. [facility] Pt did not have any services at home prior to admission. PT is recommending SNF--CSW is working on placement.              Expected Discharge Plan: Skilled Nursing Facility Barriers to Discharge: Continued Medical Work up   Patient Goals and CMS Choice Patient states their goals for this hospitalization and ongoing recovery are:: for pt to get better   Choice offered to / list presented to : Adult Children,Patient  Expected Discharge Plan and Services Expected Discharge Plan: [place] In-house Referral: Clinical Social Work   Post Acute Care Choice: [place] Living arrangements for the past 2 months: [place]                                      Prior Living Arrangements/Services Living arrangements for the past 2 months: Single Family Home Lives with:: Self Patient language and need for interpreter reviewed:: Yes Do  you feel safe going back to the place where you live?: Yes      Need for Family Participation in Patient Care: Yes (Comment) Care giver support system in place?: Yes (comment)   Criminal Activity/Legal Involvement Pertinent to Current Situation/Hospitalization: No - Comment as needed  Activities of Daily Living Home Assistive Devices/Equipment: Cane (specify quad or straight),Walker (specify type),Shower chair with back ADL Screening (condition at time of admission) Patient's cognitive ability adequate to safely complete daily activities?: Yes Is the patient deaf or have difficulty hearing?: No Does the patient have difficulty seeing, even when wearing glasses/contacts?: No Does the patient have difficulty concentrating, remembering, or making decisions?: No Patient able to express need for assistance with ADLs?: Yes Does the patient have difficulty dressing or bathing?: No Independently performs ADLs?: Yes (appropriate for developmental age) Does the patient have difficulty walking or climbing stairs?: Yes Weakness of Legs: Both Weakness of Arms/Hands: Both  Permission Sought/Granted Permission sought to share information with : Family Supports Permission granted to share information with : Yes, Verbal Permission Granted  Share Information with NAME: [name]  Permission granted to share info w AGENCY: Radiation protection practitioner, [facility]  Permission granted to share info w Relationship: son     Emotional Assessment Appearance:: Appears stated age Attitude/Demeanor/Rapport: Grieving Affect (  typically observed): Anxious Orientation: : Oriented to Self,Oriented to Place,Oriented to  Time,Oriented to Situation Alcohol / Substance Use: Not Applicable Psych Involvement: No (comment)  Admission diagnosis:  Chronic pulmonary edema [J81.1] Anasarca [R60.1] ESRD (end stage renal disease) ([place]) [N18.6] Fluid overload [E87.70] Ascites [R18.8] Severe anemia [D64.9] Patient Active Problem List    Diagnosis Date Noted  . Chronic diastolic CHF (congestive heart failure) ([place]) 12/04/2020  . Bradycardia 09/17/2020  . Advanced care planning/counseling discussion   . Discitis of thoracic region   . Fluid overload 08/10/2020  . HLD (hyperlipidemia) 08/10/2020  . Chest pain 08/10/2020  . CAD (coronary artery disease) 08/10/2020  . GERD (gastroesophageal reflux disease) 08/10/2020  . Obesity (BMI 30-39.9) 06/06/2020  . Anemia in ESRD (end-stage renal disease) ([place]) 05/31/2020  . Type 1 diabetes mellitus ([place]) 05/31/2020  . End-stage renal disease ([place]) 05/31/2020  . Hypertensive disorder 05/31/2020  . Arm pain 05/21/2020  . Cellulitis of right leg 05/02/2020  . ESRD (end stage renal disease) ([place]) 04/13/2020  . SOBOE (shortness of breath on exertion) 02/08/2020  . Hematoma of arm, left, subsequent encounter 11/24/2019  . Respiratory failure ([place])   . Shortness of breath   . Complication of arteriovenous dialysis fistula 11/13/2019  . PVD (peripheral vascular disease) ([place]) 11/13/2019  . Severe anemia 11/13/2019  . Pressure injury of skin 11/13/2019  . ESRD on hemodialysis ([place])   . Hemorrhagic shock ([place]) 11/11/2019  . Leg pain 10/02/2019  . CAP (community acquired pneumonia) 10/01/2019  . Atherosclerosis of native arteries of extremity with intermittent claudication ([place]) 05/01/2019  . Right-sided headache   . COPD (chronic obstructive pulmonary disease) ([place]) 10/06/2018  . Syncope 10/04/2018  . Symptomatic anemia 06/18/2018  . Gastroparesis due to DM ([place]) 01/18/2018  . Complication of vascular access for dialysis 12/04/2017  . Osteomyelitis ([place]) 09/30/2017  . Carotid stenosis 06/18/2017  . Bilateral carotid artery stenosis 06/16/2017  . Benign essential HTN 05/19/2017  . CKD (chronic kidney disease) stage 5, GFR less than 15 ml/min (HCC) 05/19/2017  . Hyperlipidemia, mixed 05/19/2017  . Shortness of breath 05/04/2017  . Rash, skin 12/16/2016  . Cellulitis of lower  extremity 07/29/2016  . Chronic venous insufficiency 07/29/2016  . Lymphedema 07/29/2016  . TIA (transient ischemic attack) 04/21/2016  . Altered mental status 04/08/2016  . Hyperammonemia ([place]) 04/08/2016  . Elevated troponin 04/08/2016  . Depression 04/08/2016  . Depression, major, recurrent, severe with psychosis ([place]) 04/08/2016  . Blood in stool   . Intractable cyclical vomiting with nausea   . Reflux esophagitis   . Gastritis   . Generalized abdominal pain   . Uncontrollable vomiting   . Major depressive disorder, recurrent episode, moderate ([name]) 03/15/2016  . Adjustment disorder with mixed anxiety and depressed mood 03/15/2016  . Malnutrition of moderate degree 12/01/2015  . Renal mass   . Dyspnea   . Acute renal failure ([place])   . Respiratory failure ([place])   . High temperature 11/14/2015  . Encounter for central line placement   . Encounter for orogastric (OG) tube placement   . Nausea 11/12/2015  . Hyperkalemia 10/03/2015  . Diarrhea, unspecified 07/22/2015  . Pneumonia 05/21/2015  . Hypoglycemia 04/24/2015  . Unresponsiveness 04/24/2015  . Sinus bradycardia 04/24/2015  . Hypothermia 04/24/2015  . Acute respiratory failure ([place]) 04/24/2015  . Acute on chronic diastolic CHF (congestive heart failure) ([place]) 04/05/2015  . Diabetic gastroparesis ([place]) 04/05/2015  . Hypokalemia 04/05/2015  . Generalized weakness 04/05/2015  . Acute pulmonary edema ([place]) 04/03/2015  .  Nausea and vomiting 04/03/2015  . Hypoglycemia associated with diabetes ([place]) 04/03/2015  . Anemia of chronic disease 04/03/2015  . Secondary hyperparathyroidism ([place]) 04/03/2015  . Pressure ulcer 04/02/2015  . Acute respiratory failure with hypoxia ([place]) 04/01/2015  . Adjustment disorder with anxiety 03/14/2015  . Somatic symptom disorder, mild 03/08/2015  . Coronary artery disease involving native coronary artery of native heart without angina pectoris   . Nausea & vomiting 03/06/2015  . Abdominal  pain 03/06/2015  . Type 2 diabetes mellitus with hypoglycemia without coma, without long-term current use of insulin ([place]) 03/06/2015  . HTN (hypertension) 03/06/2015  . Gastroparesis 02/24/2015  . Pleural effusion 02/19/2015  . Bacteremia due to Enterococcus 02/19/2015  . End-stage renal disease on hemodialysis ([place]) 02/19/2015  . Diarrhea 08/01/2014   PCP:  Denton Lank, MD Pharmacy:   [id number] [place], [address] [place] [address] Phone: [phone] Fax: [phone]     Social Determinants of Health (SDOH) Interventions    Readmission Risk Interventions Readmission Risk Prevention Plan 12/11/2020 09/21/2020 08/14/2020  Transportation Screening Complete Complete -  Medication Review Press photographer) Complete Complete -  PCP or Specialist appointment within 3-5 days of discharge Complete Complete Complete  HRI or Home Care Consult Complete Complete Complete  SW Recovery Care/Counseling Consult Complete Complete Complete  Palliative Care Screening Not Applicable Complete Complete  [place] Not Complete Complete Not Applicable  SNF Comments working on placement - -  Some recent data might be hidden

## 2020-12-11 NOTE — Progress Notes (Signed)
Patient concerned about right arm and low blood sugars  (thsi am was 45).   Per patient she has been having weakness in that right arm since most recent  Procedure.    MD is aware

## 2020-12-12 DIAGNOSIS — Z66 Do not resuscitate: Secondary | ICD-10-CM | POA: Diagnosis not present

## 2020-12-12 DIAGNOSIS — Z7189 Other specified counseling: Secondary | ICD-10-CM | POA: Diagnosis not present

## 2020-12-12 DIAGNOSIS — N186 End stage renal disease: Secondary | ICD-10-CM | POA: Diagnosis not present

## 2020-12-12 DIAGNOSIS — F4323 Adjustment disorder with mixed anxiety and depressed mood: Secondary | ICD-10-CM | POA: Diagnosis not present

## 2020-12-12 DIAGNOSIS — Z515 Encounter for palliative care: Secondary | ICD-10-CM | POA: Diagnosis not present

## 2020-12-12 LAB — GLUCOSE, CAPILLARY
Glucose-Capillary: 83 mg/dL (ref 70–99)
Glucose-Capillary: 108 mg/dL — ABNORMAL HIGH (ref 70–99)
Glucose-Capillary: 143 mg/dL — ABNORMAL HIGH (ref 70–99)
Glucose-Capillary: 134 mg/dL — ABNORMAL HIGH (ref 70–99)

## 2020-12-12 LAB — SARS CORONAVIRUS 2 (TAT 6-24 HRS): SARS Coronavirus 2: NEGATIVE

## 2020-12-12 MED ORDER — ALTEPLASE 2 MG IJ SOLR: 4.0000 mg | Freq: Once | INTRAMUSCULAR | Status: DC

## 2020-12-12 NOTE — Progress Notes (Signed)
PROGRESS NOTE    [name]  ION:[id number] DOB: 1961/01/09 DOA: 12/04/2020 PCP: Denton Lank, MD   Brief Narrative: Taken from prior notes. [name] a 60 y.o.femalewith medical history significant forESRD on HD Tu/Thurs/Sat, DM, Chronic diastolic HF, CAD, PVD, recent T7-T8 discitis/osteomyelitis tx with 6 week course of IV vancomycin and ceftazidime ending on 10/10/20, atherosclerotic occlusive disease b/l UE s/p right brachial artery angioplasty on 11/27/20 who presents with concerns of worsening abdominal and LE edema and pain. Remained volume up, although improving.  Volume is being managed with dialysis. Patient now has a bed at [facility] authorization.  Covid test ordered.  Subjective: Patient was feeling little better when seen today.  No new complaint.  Eating breakfast.  We discussed about going to [facility] as she has a bed now, pending insurance authorization.  Assessment & Plan:   Principal Problem:   Adjustment disorder with mixed anxiety and depressed mood Active Problems:   End-stage renal disease on hemodialysis (HCC)   HTN (hypertension)   Hypoglycemia associated with diabetes (HCC)   Hypokalemia   Sinus bradycardia   Severe anemia   Anemia in ESRD (end-stage renal disease) (HCC)   Fluid overload   Chronic diastolic CHF (congestive heart failure) (HCC)  Acute hypoxic respiratory failure secondary to pulmonary edema.  Resolved.  Now saturating well on room air -Volume is mostly being controlled with dialysis.  Anasarca/chronic diastolic heart failure. -Continue with dialysis to control extra volume, improving but still appears up from her baseline. -Continue with IV Lasix- Will change to p.o. on discharge  Generalized weakness/multiple admissions.  Patient has more than 4 admissions in the past 40-month per chart review. Palliative care was consulted-wants to continue with dialysis and will continue with palliative care as an  outpatient. -PT recommending SNF placement -TOC consult-patient has a bed offer from [facility] now, pending insurance authorization. -Overall poor prognosis.  Hypoglycemia with history of type 2 diabetes.  Secondary to poor p.o. intake.  A1c of 4.6. CBG decreased to 45 this morning which improved with 1 amp of dextrose and Sprite. -Continue to monitor -Encourage p.o. intake  Hypertension.  Blood pressure within goal. -Continue home dose of amlodipine and hydralazine -Continue with IV Lasix  Anemia of chronic kidney disease.  S/p 1 unit of PRBC. Hemoglobin improved to 12.6  but all cell lines increased so may be some element of hemoconcentration.  No labs done for today. -Continue to monitor  Asymptomatic bradycardia. -Keep holding Coreg.  History of chronic depression/mood disorder.  Not on any medication at this time.  She was seen by Dr. Weber Cooks.  Stage I obesity. Estimated body mass index is 30.62 kg/m as calculated from the following:   Height as of this encounter: 5\' 5"  (1.651 m).   Weight as of this encounter: 83.5 kg.  Objective: Vitals:   12/12/20 0406 12/12/20 0852 12/12/20 1149 12/12/20 1614  BP: (!) 124/51 133/62 (!) 148/71 (!) 140/55  Pulse: 61 65 63 63  Resp: 19 18 18 18   Temp: 98.8 F (37.1 C) 97.9 F (36.6 C) 97.6 F (36.4 C) 97.9 F (36.6 C)  TempSrc: Oral Oral Oral   SpO2: 100% 100% 93% 93%  Weight:   83.5 kg   Height:        Intake/Output Summary (Last 24 hours) at 12/12/2020 1616 Last data filed at 12/11/2020 1855 Gross per 24 hour  Intake --  Output 3000 ml  Net -3000 ml   [employer]  12/09/20 0247 12/11/20 1300 12/12/20 1149  Weight: 83.5 kg 88 kg 83.5 kg    Examination:  General.  Well-developed lady, in no acute distress. Pulmonary.  Very few basal crackles, normal respiratory effort. CV.  Regular rate and rhythm, no JVD, rub or murmur. Abdomen.  Soft, nontender, nondistended, BS positive. CNS.  Alert and oriented x3.  No  focal neurologic deficit. Extremities.  1+ LE edema, no cyanosis, pulses intact and symmetrical. Psychiatry.  Judgment and insight appears normal.  DVT prophylaxis: Heparin Code Status: DNR Family Communication: Discussed with patient Disposition Plan:  Status is: Inpatient  Remains inpatient appropriate because:Inpatient level of care appropriate due to severity of illness   Dispo: The patient is from: Home              Anticipated d/c is to: SNF              Patient currently is not medically stable to d/c.   Difficult to place patient No              Level of care: Med-Surg  All the records are reviewed and case discussed with Care Management/Social Worker. Management plans discussed with the patient, nursing and they are in agreement.  Consultants:   Nephrology  Procedures:  Antimicrobials:   Data Reviewed: I have personally reviewed following labs and imaging studies  CBC: Recent Labs  Lab 12/06/20 0530 12/07/20 0440 12/08/20 0425 12/11/20 1557  WBC 5.1 4.8 7.4 3.1*  HGB 7.9* 7.9* 8.6* 7.3*  HCT 22.9* 23.6* 27.3* 22.8*  MCV 81.8 83.7 85.8 84.8  PLT 78* 76* 86* 161*   Basic Metabolic Panel: Recent Labs  Lab 12/06/20 0530 12/07/20 0440 12/08/20 0425 12/11/20 1557  NA 142 140 139 140  K 3.9 3.8 4.3 3.8  CL 91* 93* 93* 91*  CO2 38* 37* 34* 40*  GLUCOSE 72 71 51* 93  BUN 17 10 15 17   CREATININE 5.25* 4.00* 4.51* 3.92*  CALCIUM 8.1* 8.4* 8.6* 8.3*  PHOS  --   --   --  2.3*   GFR: Estimated Creatinine Clearance: 16.1 mL/min (A) (by C-G formula based on SCr of 3.92 mg/dL (H)). Liver Function Tests: Recent Labs  Lab 12/11/20 1557  ALBUMIN 2.6*   No results for input(s): LIPASE, AMYLASE in the last 168 hours. No results for input(s): AMMONIA in the last 168 hours. Coagulation Profile: No results for input(s): INR, PROTIME in the last 168 hours. Cardiac Enzymes: No results for input(s): CKTOTAL, CKMB, CKMBINDEX, TROPONINI in the last 168 hours. BNP  (last 3 results) No results for input(s): PROBNP in the last 8760 hours. HbA1C: No results for input(s): HGBA1C in the last 72 hours. CBG: Recent Labs  Lab 12/11/20 0907 12/11/20 1157 12/11/20 2153 12/12/20 0854 12/12/20 1112  GLUCAP 94 81 71 83 108*   Lipid Profile: No results for input(s): CHOL, HDL, LDLCALC, TRIG, CHOLHDL, LDLDIRECT in the last 72 hours. Thyroid Function Tests: No results for input(s): TSH, T4TOTAL, FREET4, T3FREE, THYROIDAB in the last 72 hours. Anemia Panel: No results for input(s): VITAMINB12, FOLATE, FERRITIN, TIBC, IRON, RETICCTPCT in the last 72 hours. Sepsis Labs: No results for input(s): PROCALCITON, LATICACIDVEN in the last 168 hours.  Recent Results (from the past 240 hour(s))  Resp Panel by RT-PCR (Flu A&B, Covid) Nasopharyngeal Swab     Status: None   Collection Time: 12/04/20  4:55 PM   Specimen: Nasopharyngeal Swab; Nasopharyngeal(NP) swabs in vial transport medium  Result Value Ref Range Status  SARS Coronavirus 2 by RT PCR NEGATIVE NEGATIVE Final    Comment: (NOTE) SARS-CoV-2 target nucleic acids are NOT DETECTED.  The SARS-CoV-2 RNA is generally detectable in upper respiratory specimens during the acute phase of infection. The lowest concentration of SARS-CoV-2 viral copies this assay can detect is 138 copies/mL. A negative result does not preclude SARS-Cov-2 infection and should not be used as the sole basis for treatment or other patient management decisions. A negative result may occur with  improper specimen collection/handling, submission of specimen other than nasopharyngeal swab, presence of viral mutation(s) within the areas targeted by this assay, and inadequate number of viral copies(<138 copies/mL). A negative result must be combined with clinical observations, patient history, and epidemiological information. The expected result is Negative.  Fact Sheet for Patients:  EntrepreneurPulse.com.au  Fact  Sheet for Healthcare Providers:  IncredibleEmployment.be  This test is no t yet approved or cleared by the [place] FDA and  has been authorized for detection and/or diagnosis of SARS-CoV-2 by FDA under an Emergency Use Authorization (EUA). This EUA will remain  in effect (meaning this test can be used) for the duration of the COVID-19 declaration under Section 564(b)(1) of the Act, 21 U.S.C.section 360bbb-3(b)(1), unless the authorization is terminated  or revoked sooner.       Influenza A by PCR NEGATIVE NEGATIVE Final   Influenza B by PCR NEGATIVE NEGATIVE Final    Comment: (NOTE) The Xpert Xpress SARS-CoV-2/FLU/RSV plus assay is intended as an aid in the diagnosis of influenza from Nasopharyngeal swab specimens and should not be used as a sole basis for treatment. Nasal washings and aspirates are unacceptable for Xpert Xpress SARS-CoV-2/FLU/RSV testing.  Fact Sheet for Patients: EntrepreneurPulse.com.au  Fact Sheet for Healthcare Providers: IncredibleEmployment.be  This test is not yet approved or cleared by the [place] FDA and has been authorized for detection and/or diagnosis of SARS-CoV-2 by FDA under an Emergency Use Authorization (EUA). This EUA will remain in effect (meaning this test can be used) for the duration of the COVID-19 declaration under Section 564(b)(1) of the Act, 21 U.S.C. section 360bbb-3(b)(1), unless the authorization is terminated or revoked.  Performed at [facility], [address] [place], [place] [address]   MRSA PCR Screening     Status: None   Collection Time: 12/05/20 10:32 AM   Specimen: Nasopharyngeal  Result Value Ref Range Status   MRSA by PCR NEGATIVE NEGATIVE Final    Comment:        The GeneXpert MRSA Assay (FDA approved for NASAL specimens only), is one component of a comprehensive MRSA colonization surveillance program. It is not intended to  diagnose MRSA infection nor to guide or monitor treatment for MRSA infections. Performed at [facility], [address] [place], [place] [address]      Radiology Studies: No results found.  Scheduled Meds: . amLODipine  10 mg Oral Daily  . atorvastatin  20 mg Oral QPM  . Chlorhexidine Gluconate Cloth  6 each Topical Daily  . cholestyramine light  4 g Oral BID  . epoetin (EPOGEN/PROCRIT) injection  4,000 Units Intravenous Q T,Th,Sa-HD  . furosemide  80 mg Intravenous Daily  . gabapentin  100 mg Oral QPM  . heparin  5,000 Units Subcutaneous Q8H  . hydrALAZINE  50 mg Oral Q8H  . hydrOXYzine  25 mg Oral Daily  . insulin aspart  0-5 Units Subcutaneous QHS  . insulin aspart  0-9 Units Subcutaneous TID WC  . lipase/protease/amylase  36,000  Units Oral TID AC  . naphazoline-glycerin  1-2 drop Both Eyes BID  . pantoprazole  40 mg Oral Daily  . sevelamer carbonate  800 mg Oral TID WC  . cyanocobalamin  1,000 mcg Oral Daily   Continuous Infusions: . albumin human       LOS: 7 days   Time spent: 30 minutes. More than 50% of the time was spent in counseling/coordination of care  Lorella Nimrod, MD [facility]  If 7PM-7AM, please contact night-coverage [email or web address]  12/12/2020, 4:16 PM   This record has been created using Systems analyst. Errors have been sought and corrected,but may not always be located. Such creation errors do not reflect on the standard of care.

## 2020-12-12 NOTE — TOC Progression Note (Signed)
Transition of Care [facility]) - Progression Note    Patient Details  Name: [name] MRN: [id number] Date of Birth: 09-13-1961  [facility] CM/SW Contact  Eileen Stanford, LCSW Phone Number: 12/12/2020, 9:19 AM  Clinical Narrative:   CSW provided bed offer, pt is agreeable to [facility]. Pt has regular [employer], Magda Paganini starting auth.    Expected Discharge Plan: [place] Barriers to Discharge: Continued Medical Work up  Expected Discharge Plan and Services Expected Discharge Plan: [place] In-house Referral: Clinical Social Work   Post Acute Care Choice: [place] Living arrangements for the past 2 months: [place] Determinants of Health (SDOH) Interventions    Readmission Risk Interventions Readmission Risk Prevention Plan 12/11/2020 09/21/2020 08/14/2020  Transportation Screening Complete Complete -  Medication Review Press photographer) Complete Complete -  PCP or Specialist appointment within 3-5 days of discharge Complete Complete Complete  HRI or Home Care Consult Complete Complete Complete  SW Recovery Care/Counseling Consult Complete Complete Complete  Palliative Care Screening Not Applicable Complete Complete  [place] Not Complete Complete Not Applicable  SNF Comments working on placement - -  Some recent data might be hidden

## 2020-12-12 NOTE — Progress Notes (Signed)
Physical Therapy Treatment Patient Details Name: [name] MRN: [id number] DOB: 07-10-1961 Today's Date: 12/12/2020    History of Present Illness Pt is a 60 y.o. female with medical history significant for ESRD on HD Tu/Thurs/Sat, DM, Chronic diastolic HF, CAD, PVD, recent T7-T8 discitis/osteomyelitis tx with 6 week course of IV vancomycin and ceftazidime ending on 10/10/20,  atherosclerotic occlusive disease b/l UE s/p right brachial artery angioplasty on 11/27/20 who presents with concerns of worsening abdominal and LE edema and pain.  MD assessment includes: Acute hypoxia, anasarca/lower extremity edema in the setting of ESRD, hypoglycemia, hypokalemia, anemia of chronic kidney disease, bradycardia, and HTN.    PT Comments    Pt sitting on EOB upon arrival.  Had gotten to edge on her own with rail.  Voicing numerous complaints upon arrival about having to wait for staff.  Just after entering room +2 techs entered room to help.  She is able to stand with min a x 2 and transfer to commode with RW and cues.  She was quite vocal regarding where she wanted commode placed and to remove dressing over wound despite it not interfering with toiltetting activities or being soiled.  Dressing was removed per her persistence in asking.  She is able to have a medium soft BM.  She stands with min guard for care and transfers back to bed.  New dressing was brought and placed prior to returning to bed.  Pt returned to EOB but chose to remain sitting on EOB.  Bed alarm set.  Pt generally agitated with staff and unable to make pt happy despite doing all reasonable requests during session. Pt on phone with son upon leaving room.   Follow Up Recommendations  SNF;Supervision/Assistance - 24 hour     Equipment Recommendations  None recommended by PT    Recommendations for Other Services       Precautions / Restrictions Precautions Precautions: Fall Restrictions Weight Bearing Restrictions: No    Mobility  Bed  Mobility Overal bed mobility: Modified Independent Bed Mobility: Supine to Sit;Sit to Supine Rolling: Modified independent (Device/Increase time)   Supine to sit: Modified independent (Device/Increase time) Sit to supine: Modified independent (Device/Increase time)   General bed mobility comments: pt had potten herself to EOB prior to arrival and was able to return to supine with no assistance    Transfers Overall transfer level: Needs assistance Equipment used: Rolling walker (2 wheeled) Transfers: Sit to/from Stand Sit to Stand: Min guard            Ambulation/Gait Ambulation/Gait assistance: Min guard Gait Distance (Feet): 3 Feet Assistive device: Rolling walker (2 wheeled) Gait Pattern/deviations: Step-to pattern;Trunk flexed     General Gait Details: able to take steps with walker to/from commode at bedside with no buckling and overall good tolerance   Stairs             Wheelchair Mobility    Modified Rankin (Stroke Patients Only)       Balance Overall balance assessment: Needs assistance Sitting-balance support: Bilateral upper extremity supported;Feet supported Sitting balance-Leahy Scale: Fair     Standing balance support: Bilateral upper extremity supported Standing balance-Leahy Scale: Fair                              Cognition Arousal/Alertness: Awake/alert Behavior During Therapy: Agitated Overall Cognitive Status: Within Functional Limits for tasks assessed  General Comments: generaly agitated with multiple complaints during session on a variety of issues      Exercises      General Comments        Pertinent Vitals/Pain Pain Assessment: No/denies pain    Home Living                      Prior Function            PT Goals (current goals can now be found in the care plan section) Progress towards PT goals: Progressing toward goals    Frequency    Min  2X/week      PT Plan Current plan remains appropriate    Co-evaluation              AM-PAC PT "6 Clicks" Mobility   Outcome Measure  Help needed turning from your back to your side while in a flat bed without using bedrails?: None Help needed moving from lying on your back to sitting on the side of a flat bed without using bedrails?: A Little Help needed moving to and from a bed to a chair (including a wheelchair)?: A Little Help needed standing up from a chair using your arms (e.g., wheelchair or bedside chair)?: A Little Help needed to walk in hospital room?: A Little Help needed climbing 3-5 steps with a railing? : A Lot 6 Click Score: 18    End of Session Equipment Utilized During Treatment: Gait belt Activity Tolerance: Treatment limited secondary to agitation Patient left: in bed;with call bell/phone within reach;with bed alarm set Nurse Communication: Mobility status;Other (comment) PT Visit Diagnosis: Muscle weakness (generalized) (M62.81);Difficulty in walking, not elsewhere classified (R26.2)     Time: 1771-1657 PT Time Calculation (min) (ACUTE ONLY): 26 min  Charges:  $Gait Training: 8-22 mins $Therapeutic Activity: 8-22 mins                     Chesley Noon, PTA 12/12/20, 11:45 AM

## 2020-12-12 NOTE — Progress Notes (Signed)
Daily Progress Note   Patient Name: [name]       Date: 12/12/2020 DOB: [birth year]  Age: 60 y.o. MRN#: [id number] Attending Physician: Lorella Nimrod, MD Primary Care Physician: Denton Lank, MD Admit Date: 12/04/2020  Reason for Consultation/Follow-up: Establishing goals of care  Subjective: Resting, nausea better  Length of Stay: 7  Current Medications: Scheduled Meds:  . amLODipine  10 mg Oral Daily  . atorvastatin  20 mg Oral QPM  . Chlorhexidine Gluconate Cloth  6 each Topical Daily  . cholestyramine light  4 g Oral BID  . epoetin (EPOGEN/PROCRIT) injection  4,000 Units Intravenous Q T,Th,Sa-HD  . furosemide  80 mg Intravenous Daily  . gabapentin  100 mg Oral QPM  . heparin  5,000 Units Subcutaneous Q8H  . hydrALAZINE  50 mg Oral Q8H  . hydrOXYzine  25 mg Oral Daily  . insulin aspart  0-5 Units Subcutaneous QHS  . insulin aspart  0-9 Units Subcutaneous TID WC  . lipase/protease/amylase  36,000 Units Oral TID AC  . naphazoline-glycerin  1-2 drop Both Eyes BID  . pantoprazole  40 mg Oral Daily  . sevelamer carbonate  800 mg Oral TID WC  . cyanocobalamin  1,000 mcg Oral Daily    Continuous Infusions: . albumin human      PRN Meds: calcium carbonate, diphenhydrAMINE, HYDROcodone-acetaminophen, meclizine, sevelamer carbonate  Physical Exam Constitutional:      General: She is not in acute distress. Pulmonary:     Effort: Pulmonary effort is normal.  Skin:    General: Skin is warm and dry.  Neurological:     Mental Status: She is alert and oriented to person, place, and time.             Vital Signs: BP (!) 148/71 (BP Location: Left Arm)   Pulse 63   Temp 97.6 F (36.4 C) (Oral)   Resp 18   Ht 5\' 5"  (1.651 m)   Wt 83.5 kg   SpO2 93%   BMI 30.62 kg/m  SpO2: SpO2: 93  % O2 Device: O2 Device: Room Air O2 Flow Rate: O2 Flow Rate (L/min): 1 L/min  Intake/output summary:   Intake/Output Summary (Last 24 hours) at 12/12/2020 1357 Last data filed at 12/11/2020 1855 Gross per 24 hour  Intake --  Output 3000 ml  Net -3000 ml   LBM: Last BM Date: 12/11/20 Baseline Weight: Weight: 84.3 kg Most recent weight: Weight: 83.5 kg       Palliative Assessment/Data: PPS 40%    Flowsheet Rows   Flowsheet Row Most Recent Value  Intake Tab   Referral Department Hospitalist  Unit at Time of Referral Cardiac/Telemetry Unit  Palliative Care Primary Diagnosis Nephrology  Date Notified 12/05/20  Palliative Care Type Return patient Palliative Care  Reason for referral Clarify Goals of Care  Date of Admission 12/04/20  Date first seen by Palliative Care 12/07/20  # of days Palliative referral response time 2 Day(s)  # of days IP prior to Palliative referral 1  Clinical Assessment   Palliative Performance Scale Score 40%  Psychosocial & Spiritual Assessment   Palliative Care Outcomes   Patient/Family meeting held? Yes  Who was at the meeting?  patient  Palliative Care Outcomes Improved non-pain symptom therapy, Clarified goals of care, Counseled regarding hospice, Provided psychosocial or spiritual support      Patient Active Problem List   Diagnosis Date Noted  . Chronic diastolic CHF (congestive heart failure) ([place]) 12/04/2020  . Bradycardia 09/17/2020  . Advanced care planning/counseling discussion   . Discitis of thoracic region   . Fluid overload 08/10/2020  . HLD (hyperlipidemia) 08/10/2020  . Chest pain 08/10/2020  . CAD (coronary artery disease) 08/10/2020  . GERD (gastroesophageal reflux disease) 08/10/2020  . Obesity (BMI 30-39.9) 06/06/2020  . Anemia in ESRD (end-stage renal disease) ([place]) 05/31/2020  . Type 1 diabetes mellitus ([place]) 05/31/2020  . End-stage renal disease ([place]) 05/31/2020  . Hypertensive disorder 05/31/2020  . Arm pain  05/21/2020  . Cellulitis of right leg 05/02/2020  . ESRD (end stage renal disease) ([place]) 04/13/2020  . SOBOE (shortness of breath on exertion) 02/08/2020  . Hematoma of arm, left, subsequent encounter 11/24/2019  . Respiratory failure ([place])   . Shortness of breath   . Complication of arteriovenous dialysis fistula 11/13/2019  . PVD (peripheral vascular disease) ([place]) 11/13/2019  . Severe anemia 11/13/2019  . Pressure injury of skin 11/13/2019  . ESRD on hemodialysis ([place])   . Hemorrhagic shock ([place]) 11/11/2019  . Leg pain 10/02/2019  . CAP (community acquired pneumonia) 10/01/2019  . Atherosclerosis of native arteries of extremity with intermittent claudication ([place]) 05/01/2019  . Right-sided headache   . COPD (chronic obstructive pulmonary disease) ([place]) 10/06/2018  . Syncope 10/04/2018  . Symptomatic anemia 06/18/2018  . Gastroparesis due to DM ([place]) 01/18/2018  . Complication of vascular access for dialysis 12/04/2017  . Osteomyelitis ([place]) 09/30/2017  . Carotid stenosis 06/18/2017  . Bilateral carotid artery stenosis 06/16/2017  . Benign essential HTN 05/19/2017  . CKD (chronic kidney disease) stage 5, GFR less than 15 ml/min (HCC) 05/19/2017  . Hyperlipidemia, mixed 05/19/2017  . Shortness of breath 05/04/2017  . Rash, skin 12/16/2016  . Cellulitis of lower extremity 07/29/2016  . Chronic venous insufficiency 07/29/2016  . Lymphedema 07/29/2016  . TIA (transient ischemic attack) 04/21/2016  . Altered mental status 04/08/2016  . Hyperammonemia ([place]) 04/08/2016  . Elevated troponin 04/08/2016  . Depression 04/08/2016  . Depression, major, recurrent, severe with psychosis ([place]) 04/08/2016  . Blood in stool   . Intractable cyclical vomiting with nausea   . Reflux esophagitis   . Gastritis   . Generalized abdominal pain   . Uncontrollable vomiting   . Major depressive disorder, recurrent episode, moderate ([place]) 03/15/2016  . Adjustment disorder with mixed anxiety and  depressed mood 03/15/2016  . Malnutrition of moderate degree 12/01/2015  . Renal mass   . Dyspnea   . Acute renal failure ([place])   . Respiratory failure ([place])   . High temperature 11/14/2015  . Encounter for central line placement   . Encounter for orogastric (OG) tube placement   . Nausea 11/12/2015  . Hyperkalemia 10/03/2015  . Diarrhea, unspecified 07/22/2015  . Pneumonia 05/21/2015  . Hypoglycemia 04/24/2015  . Unresponsiveness 04/24/2015  . Sinus bradycardia 04/24/2015  . Hypothermia 04/24/2015  . Acute respiratory failure ([place]) 04/24/2015  . Acute on chronic diastolic CHF (congestive heart failure) ([place]) 04/05/2015  . Diabetic gastroparesis ([place]) 04/05/2015  . Hypokalemia 04/05/2015  . Generalized weakness 04/05/2015  . Acute pulmonary edema ([place]) 04/03/2015  . Nausea and vomiting 04/03/2015  . Hypoglycemia associated with diabetes ([place]) 04/03/2015  . Anemia of chronic disease 04/03/2015  . Secondary  hyperparathyroidism ([place]) 04/03/2015  . Pressure ulcer 04/02/2015  . Acute respiratory failure with hypoxia ([place]) 04/01/2015  . Adjustment disorder with anxiety 03/14/2015  . Somatic symptom disorder, mild 03/08/2015  . Coronary artery disease involving native coronary artery of native heart without angina pectoris   . Nausea & vomiting 03/06/2015  . Abdominal pain 03/06/2015  . Type 2 diabetes mellitus with hypoglycemia without coma, without long-term current use of insulin ([place]) 03/06/2015  . HTN (hypertension) 03/06/2015  . Gastroparesis 02/24/2015  . Pleural effusion 02/19/2015  . Bacteremia due to Enterococcus 02/19/2015  . End-stage renal disease on hemodialysis ([place]) 02/19/2015  . Diarrhea 08/01/2014    Palliative Care Assessment & Plan   HPI: 60 y.o. female  with past medical history of ESRD on HD, E5ID, Chronic diastolic HF, CAD, PVD, recent T7-T8 discitis/osteomyelitis tx with 6 week course of IV vancomycin and ceftazidime ending on 10/10/20,  andatherosclerotic occlusive disease b/l UE s/p right brachial artery angioplasty on 11/27/20  admitted on 12/04/2020 with concerns of worsening abdominal and LE edema and pain. Patient has had multiple admissions - 4 in the past 6 months per epic. She is followed by outpatient palliative care. PMT consulted for ongoing [place] discussions.   Assessment: Follow up with Ms. Bitting.   She again tells me how tired she is. Tells me she just wants to be left alone; however, she also know her family - son in particular - would like to continue with all medical treatments offered. For now, she would like to continue HD. She tells me that it does give her something to do, somewhere to go. She also tells me she understands her body is failing and she is near end of life. She tells me she "is ready when God is ready". She is worried her family does not understand/accept the state of her illness.  I completed a MOST form today. The patient and family outlined their wishes for the following treatment decisions:  Cardiopulmonary Resuscitation: Do Not Attempt Resuscitation (DNR/No CPR)  Medical Interventions: Limited Additional Interventions: Use medical treatment, IV fluids and cardiac monitoring as indicated, DO NOT USE intubation or mechanical ventilation. May consider use of less invasive airway support such as BiPAP or CPAP. Also provide comfort measures. Transfer to the hospital if indicated. Avoid intensive care.   Antibiotics: Antibiotics if indicated  IV Fluids: IV fluids if indicated  Feeding Tube: Feeding tube for a defined trial period   Ms. Pruitt is agreeable to ongoing follow up with outpatient palliative care.  Recommendations/Plan: **discharging MD please write for palliative services at SNF in discharge summary as she is going to [facility] and they have their own palliative program Continue HD for now Eligible for hospice whenever she no longer wants to continue to HD DNR, most completed as  above  Code Status: DNR  Discharge Planning: [place] for rehab with Palliative care service follow-up  Care plan was discussed with patient  Thank you for allowing the Palliative Medicine Team to assist in the care of this patient.   Total Time 45 minutes Prolonged Time Billed  no       Greater than 50%  of this time was spent counseling and coordinating care related to the above assessment and plan.  Juel Burrow, DNP, [facility] Palliative Medicine Team Team Phone # [phone]  Pager [phone]

## 2020-12-12 NOTE — Progress Notes (Deleted)
[facility], [place] 12/12/20  Subjective:   LOS: 7  Patient sleeping in bed Easily aroused and able to answer questions States she ate breakfast and tolerated it well Denies shortness of breath    Objective:  Vital signs in last 24 hours:  Temp:  [97.6 F (36.4 C)-98.8 F (37.1 C)] 97.6 F (36.4 C) (03/23 1149) Pulse Rate:  [48-65] 63 (03/23 1149) Resp:  [8-19] 18 (03/23 1149) BP: (109-148)/(51-94) 148/71 (03/23 1149) SpO2:  [90 %-100 %] 93 % (03/23 1149) Weight:  [83.5 kg] 83.5 kg (03/23 1149)  Weight change:  Filed Weights   12/09/20 0247 12/11/20 1300 12/12/20 1149  Weight: 83.5 kg 88 kg 83.5 kg    Intake/Output:    Intake/Output Summary (Last 24 hours) at 12/12/2020 1316 Last data filed at 12/11/2020 1855 Gross per 24 hour  Intake -  Output 3000 ml  Net -3000 ml     Physical Exam: General:  No acute distress or pain  HEENT  moist oral mucous membranes  Pulm/lungs  Lungs clear, on 2L O2, respiration symmetrical,unlabored  CVS/Heart  S1S2,no rubs or gallops  Abdomen:   Soft,non distended  Extremities:  1+bilateral lower extremity edema  Neurologic:  Oriented x3   Skin:  No acute lesions or rashes  Access:  Left IJ PermCath       Basic Metabolic Panel:  Recent Labs  Lab 12/06/20 0530 12/07/20 0440 12/08/20 0425 12/11/20 1557  NA 142 140 139 140  K 3.9 3.8 4.3 3.8  CL 91* 93* 93* 91*  CO2 38* 37* 34* 40*  GLUCOSE 72 71 51* 93  BUN 17 10 15 17   CREATININE 5.25* 4.00* 4.51* 3.92*  CALCIUM 8.1* 8.4* 8.6* 8.3*  PHOS  --   --   --  2.3*     CBC: Recent Labs  Lab 12/06/20 0530 12/07/20 0440 12/08/20 0425 12/11/20 1557  WBC 5.1 4.8 7.4 3.1*  HGB 7.9* 7.9* 8.6* 7.3*  HCT 22.9* 23.6* 27.3* 22.8*  MCV 81.8 83.7 85.8 84.8  PLT 78* 76* 86* 104*      Lab Results  Component Value Date   HEPBSAG NON REACTIVE 12/06/2020   HEPBIGM  06/12/2010    NEGATIVE (NOTE) High levels of Hepatitis B Core IgM antibody are  detectable during the acute stage of Hepatitis B. This antibody is used to differentiate current from past HBV infection.       Microbiology:  Recent Results (from the past 240 hour(s))  Resp Panel by RT-PCR (Flu A&B, Covid) Nasopharyngeal Swab     Status: None   Collection Time: 12/04/20  4:55 PM   Specimen: Nasopharyngeal Swab; Nasopharyngeal(NP) swabs in vial transport medium  Result Value Ref Range Status   SARS Coronavirus 2 by RT PCR NEGATIVE NEGATIVE Final    Comment: (NOTE) SARS-CoV-2 target nucleic acids are NOT DETECTED.  The SARS-CoV-2 RNA is generally detectable in upper respiratory specimens during the acute phase of infection. The lowest concentration of SARS-CoV-2 viral copies this assay can detect is 138 copies/mL. A negative result does not preclude SARS-Cov-2 infection and should not be used as the sole basis for treatment or other patient management decisions. A negative result may occur with  improper specimen collection/handling, submission of specimen other than nasopharyngeal swab, presence of viral mutation(s) within the areas targeted by this assay, and inadequate number of viral copies(<138 copies/mL). A negative result must be combined with clinical observations, patient history, and epidemiological information. The expected result is Negative.  Fact Sheet for Patients:  EntrepreneurPulse.com.au  Fact Sheet for Healthcare Providers:  IncredibleEmployment.be  This test is no t yet approved or cleared by the [place] FDA and  has been authorized for detection and/or diagnosis of SARS-CoV-2 by FDA under an Emergency Use Authorization (EUA). This EUA will remain  in effect (meaning this test can be used) for the duration of the COVID-19 declaration under Section 564(b)(1) of the Act, 21 U.S.C.section 360bbb-3(b)(1), unless the authorization is terminated  or revoked sooner.       Influenza A by PCR NEGATIVE  NEGATIVE Final   Influenza B by PCR NEGATIVE NEGATIVE Final    Comment: (NOTE) The Xpert Xpress SARS-CoV-2/FLU/RSV plus assay is intended as an aid in the diagnosis of influenza from Nasopharyngeal swab specimens and should not be used as a sole basis for treatment. Nasal washings and aspirates are unacceptable for Xpert Xpress SARS-CoV-2/FLU/RSV testing.  Fact Sheet for Patients: EntrepreneurPulse.com.au  Fact Sheet for Healthcare Providers: IncredibleEmployment.be  This test is not yet approved or cleared by the [place] FDA and has been authorized for detection and/or diagnosis of SARS-CoV-2 by FDA under an Emergency Use Authorization (EUA). This EUA will remain in effect (meaning this test can be used) for the duration of the COVID-19 declaration under Section 564(b)(1) of the Act, 21 U.S.C. section 360bbb-3(b)(1), unless the authorization is terminated or revoked.  Performed at [facility], [address] [place], [place] [address]   MRSA PCR Screening     Status: None   Collection Time: 12/05/20 10:32 AM   Specimen: Nasopharyngeal  Result Value Ref Range Status   MRSA by PCR NEGATIVE NEGATIVE Final    Comment:        The GeneXpert MRSA Assay (FDA approved for NASAL specimens only), is one component of a comprehensive MRSA colonization surveillance program. It is not intended to diagnose MRSA infection nor to guide or monitor treatment for MRSA infections. Performed at [facility], [address] [place], [place] [address]     Coagulation Studies: No results for input(s): LABPROT, INR in the last 72 hours.  Urinalysis: No results for input(s): COLORURINE, LABSPEC, PHURINE, GLUCOSEU, HGBUR, BILIRUBINUR, KETONESUR, PROTEINUR, UROBILINOGEN, NITRITE, LEUKOCYTESUR in the last 72 hours.  Invalid input(s): APPERANCEUR    Imaging: No results found.   Medications:   . albumin human     .  amLODipine  10 mg Oral Daily  . atorvastatin  20 mg Oral QPM  . Chlorhexidine Gluconate Cloth  6 each Topical Daily  . cholestyramine light  4 g Oral BID  . epoetin (EPOGEN/PROCRIT) injection  4,000 Units Intravenous Q T,Th,Sa-HD  . furosemide  80 mg Intravenous Daily  . gabapentin  100 mg Oral QPM  . heparin  5,000 Units Subcutaneous Q8H  . hydrALAZINE  50 mg Oral Q8H  . hydrOXYzine  25 mg Oral Daily  . insulin aspart  0-5 Units Subcutaneous QHS  . insulin aspart  0-9 Units Subcutaneous TID WC  . lipase/protease/amylase  36,000 Units Oral TID AC  . naphazoline-glycerin  1-2 drop Both Eyes BID  . pantoprazole  40 mg Oral Daily  . sevelamer carbonate  800 mg Oral TID WC  . cyanocobalamin  1,000 mcg Oral Daily   calcium carbonate, diphenhydrAMINE, HYDROcodone-acetaminophen, meclizine, sevelamer carbonate  Assessment/ Plan:  60 y.o. female with end-stage renal disease, on chronic diastolic CHF, diabetes type 2, thoracic discitis T7-8 completed treatment jan 2022 was admitted on 12/04/2020 for  Principal Problem:  Adjustment disorder with mixed anxiety and depressed mood Active Problems:   End-stage renal disease on hemodialysis (HCC)   HTN (hypertension)   Hypoglycemia associated with diabetes ([place])   Hypokalemia   Sinus bradycardia   Severe anemia   Anemia in ESRD (end-stage renal disease) (HCC)   Fluid overload   Chronic diastolic CHF (congestive heart failure) (HCC)  Chronic pulmonary edema [J81.1] Anasarca [R60.1] ESRD (end stage renal disease) ([place]) [N18.6] Fluid overload [E87.70] Ascites [R18.8] Severe anemia [D64.9]  [facility] dialysis/TTS 1/EDW 83.5 kg/left IJ PermCath/  #. ESRD with anasarca Scheduled to receive dialysis today UF goal- 3L Next schedule treatment on Thursday D/c planning-pt needs assistance with transportation to dialysis treatments. Will need case management to assist with this request  #. Anemia of CKD  Lab Results  Component Value  Date   HGB 7.3 (L) 12/11/2020   Epogen 4000 units with dialysis    #. Secondary hyperparathyroidism of renal origin N 25.81      Component Value Date/Time   PTH 357 (H) 10/05/2018 1241   Lab Results  Component Value Date   PHOS 2.3 (L) 12/11/2020   On Sevelamer 800 mg with meals   #. Diabetes type 2 with CKD Hemoglobin A1C (%)  Date Value  07/19/2013 9.3 (H)   Hgb A1c MFr Bld (%)  Date Value  12/07/2020 4.6 (L)    Diabetic management per primary team   LOS: [place] 3/23/20221:16 PM  [place], [place]

## 2020-12-12 NOTE — Progress Notes (Signed)
[facility], [place] 12/12/20  Subjective:   LOS: 7  Patient seen sitting at side of bed Alert and oriented Able to tolerate breakfast without nausea Denies shortness of breath     Objective:  Vital signs in last 24 hours:  Temp:  [97.6 F (36.4 C)-98.8 F (37.1 C)] 97.6 F (36.4 C) (03/23 1149) Pulse Rate:  [48-65] 63 (03/23 1149) Resp:  [8-19] 18 (03/23 1149) BP: (109-148)/(51-94) 148/71 (03/23 1149) SpO2:  [90 %-100 %] 93 % (03/23 1149) Weight:  [83.5 kg] 83.5 kg (03/23 1149)  Weight change:  Filed Weights   12/09/20 0247 12/11/20 1300 12/12/20 1149  Weight: 83.5 kg 88 kg 83.5 kg    Intake/Output:    Intake/Output Summary (Last 24 hours) at 12/12/2020 1452 Last data filed at 12/11/2020 1855 Gross per 24 hour  Intake --  Output 3000 ml  Net -3000 ml     Physical Exam: General:  No acute distress  HEENT  moist oral mucous membranes  Pulm/lungs  Lungs clear, on 2L O2, respiration symmetrical,unlabored  CVS/Heart  S1S2,no rubs or gallops  Abdomen:   Soft,non distended  Extremities:  1+ peripheral edema  Neurologic:  Oriented x3   Skin:  No acute lesions or rashes  Access:  Left IJ PermCath       Basic Metabolic Panel:  Recent Labs  Lab 12/06/20 0530 12/07/20 0440 12/08/20 0425 12/11/20 1557  NA 142 140 139 140  K 3.9 3.8 4.3 3.8  CL 91* 93* 93* 91*  CO2 38* 37* 34* 40*  GLUCOSE 72 71 51* 93  BUN 17 10 15 17   CREATININE 5.25* 4.00* 4.51* 3.92*  CALCIUM 8.1* 8.4* 8.6* 8.3*  PHOS  --   --   --  2.3*     CBC: Recent Labs  Lab 12/06/20 0530 12/07/20 0440 12/08/20 0425 12/11/20 1557  WBC 5.1 4.8 7.4 3.1*  HGB 7.9* 7.9* 8.6* 7.3*  HCT 22.9* 23.6* 27.3* 22.8*  MCV 81.8 83.7 85.8 84.8  PLT 78* 76* 86* 104*      Lab Results  Component Value Date   HEPBSAG NON REACTIVE 12/06/2020   HEPBIGM  06/12/2010    NEGATIVE (NOTE) High levels of Hepatitis B Core IgM antibody are detectable during the acute stage of  Hepatitis B. This antibody is used to differentiate current from past HBV infection.       Microbiology:  Recent Results (from the past 240 hour(s))  Resp Panel by RT-PCR (Flu A&B, Covid) Nasopharyngeal Swab     Status: None   Collection Time: 12/04/20  4:55 PM   Specimen: Nasopharyngeal Swab; Nasopharyngeal(NP) swabs in vial transport medium  Result Value Ref Range Status   SARS Coronavirus 2 by RT PCR NEGATIVE NEGATIVE Final    Comment: (NOTE) SARS-CoV-2 target nucleic acids are NOT DETECTED.  The SARS-CoV-2 RNA is generally detectable in upper respiratory specimens during the acute phase of infection. The lowest concentration of SARS-CoV-2 viral copies this assay can detect is 138 copies/mL. A negative result does not preclude SARS-Cov-2 infection and should not be used as the sole basis for treatment or other patient management decisions. A negative result may occur with  improper specimen collection/handling, submission of specimen other than nasopharyngeal swab, presence of viral mutation(s) within the areas targeted by this assay, and inadequate number of viral copies(<138 copies/mL). A negative result must be combined with clinical observations, patient history, and epidemiological information. The expected result is Negative.  Fact Sheet for Patients:  EntrepreneurPulse.com.au  Fact Sheet for Healthcare Providers:  IncredibleEmployment.be  This test is no t yet approved or cleared by the [place] FDA and  has been authorized for detection and/or diagnosis of SARS-CoV-2 by FDA under an Emergency Use Authorization (EUA). This EUA will remain  in effect (meaning this test can be used) for the duration of the COVID-19 declaration under Section 564(b)(1) of the Act, 21 U.S.C.section 360bbb-3(b)(1), unless the authorization is terminated  or revoked sooner.       Influenza A by PCR NEGATIVE NEGATIVE Final   Influenza B by PCR  NEGATIVE NEGATIVE Final    Comment: (NOTE) The Xpert Xpress SARS-CoV-2/FLU/RSV plus assay is intended as an aid in the diagnosis of influenza from Nasopharyngeal swab specimens and should not be used as a sole basis for treatment. Nasal washings and aspirates are unacceptable for Xpert Xpress SARS-CoV-2/FLU/RSV testing.  Fact Sheet for Patients: EntrepreneurPulse.com.au  Fact Sheet for Healthcare Providers: IncredibleEmployment.be  This test is not yet approved or cleared by the [place] FDA and has been authorized for detection and/or diagnosis of SARS-CoV-2 by FDA under an Emergency Use Authorization (EUA). This EUA will remain in effect (meaning this test can be used) for the duration of the COVID-19 declaration under Section 564(b)(1) of the Act, 21 U.S.C. section 360bbb-3(b)(1), unless the authorization is terminated or revoked.  Performed at [facility], [address] [place], [place] [address]   MRSA PCR Screening     Status: None   Collection Time: 12/05/20 10:32 AM   Specimen: Nasopharyngeal  Result Value Ref Range Status   MRSA by PCR NEGATIVE NEGATIVE Final    Comment:        The GeneXpert MRSA Assay (FDA approved for NASAL specimens only), is one component of a comprehensive MRSA colonization surveillance program. It is not intended to diagnose MRSA infection nor to guide or monitor treatment for MRSA infections. Performed at [facility], [address] [place], [place] [address]     Coagulation Studies: No results for input(s): LABPROT, INR in the last 72 hours.  Urinalysis: No results for input(s): COLORURINE, LABSPEC, PHURINE, GLUCOSEU, HGBUR, BILIRUBINUR, KETONESUR, PROTEINUR, UROBILINOGEN, NITRITE, LEUKOCYTESUR in the last 72 hours.  Invalid input(s): APPERANCEUR    Imaging: No results found.   Medications:    albumin human      amLODipine  10 mg Oral Daily   atorvastatin   20 mg Oral QPM   Chlorhexidine Gluconate Cloth  6 each Topical Daily   cholestyramine light  4 g Oral BID   epoetin (EPOGEN/PROCRIT) injection  4,000 Units Intravenous Q T,Th,Sa-HD   furosemide  80 mg Intravenous Daily   gabapentin  100 mg Oral QPM   heparin  5,000 Units Subcutaneous Q8H   hydrALAZINE  50 mg Oral Q8H   hydrOXYzine  25 mg Oral Daily   insulin aspart  0-5 Units Subcutaneous QHS   insulin aspart  0-9 Units Subcutaneous TID WC   lipase/protease/amylase  36,000 Units Oral TID AC   naphazoline-glycerin  1-2 drop Both Eyes BID   pantoprazole  40 mg Oral Daily   sevelamer carbonate  800 mg Oral TID WC   cyanocobalamin  1,000 mcg Oral Daily   calcium carbonate, diphenhydrAMINE, HYDROcodone-acetaminophen, meclizine, sevelamer carbonate  Assessment/ Plan:  60 y.o. female with end-stage renal disease, on chronic diastolic CHF, diabetes type 2, thoracic discitis T7-8 completed treatment jan 2022 was admitted on 12/04/2020 for  Principal Problem:   Adjustment disorder with mixed  anxiety and depressed mood Active Problems:   End-stage renal disease on hemodialysis (HCC)   HTN (hypertension)   Hypoglycemia associated with diabetes ([place])   Hypokalemia   Sinus bradycardia   Severe anemia   Anemia in ESRD (end-stage renal disease) (HCC)   Fluid overload   Chronic diastolic CHF (congestive heart failure) (HCC)  Chronic pulmonary edema [J81.1] Anasarca [R60.1] ESRD (end stage renal disease) ([place]) [N18.6] Fluid overload [E87.70] Ascites [R18.8] Severe anemia [D64.9]  [facility] dialysis/TTS 1/EDW 83.5 kg/left IJ PermCath/  #. ESRD with anasarca Received dialysis yesterday UF 3L acheived Next schedule treatment on Thursday Contacted case management with treatment transportation concerns   #. Anemia of CKD  Lab Results  Component Value Date   HGB 7.3 (L) 12/11/2020   Epogen with dialysis    #. Secondary hyperparathyroidism of renal origin N 25.81       Component Value Date/Time   PTH 357 (H) 10/05/2018 1241   Lab Results  Component Value Date   PHOS 2.3 (L) 12/11/2020   Sevelamer 800 mg with meals TID   #. Diabetes type 2 with CKD Hemoglobin A1C (%)  Date Value  07/19/2013 9.3 (H)   Hgb A1c MFr Bld (%)  Date Value  12/07/2020 4.6 (L)    Diabetic management per primary team   LOS: [place] 3/23/20222:52 [place], [place]

## 2020-12-12 NOTE — TOC Progression Note (Signed)
Transition of Care [facility]) - Progression Note    Patient Details  Name: [name] MRN: [id number] Date of Birth: 08/10/1961  Transition [facility]) CM/SW Contact  Eileen Stanford, LCSW Phone Number: 12/12/2020, 1:38 PM  Clinical Narrative:  Spoke with pt to make sure she understood where she was going. Pt originally wanted [facility] however, CSW explained [facility] can not take pt. Pt verbalized understanding and understood she was going to [facility].     Expected Discharge Plan: [place] Barriers to Discharge: Continued Medical Work up  Expected Discharge Plan and Services Expected Discharge Plan: [place] In-house Referral: Clinical Social Work   Post Acute Care Choice: [place] Living arrangements for the past 2 months: [place] Determinants of Health (SDOH) Interventions    Readmission Risk Interventions Readmission Risk Prevention Plan 12/11/2020 09/21/2020 08/14/2020  Transportation Screening Complete Complete -  Medication Review Press photographer) Complete Complete -  PCP or Specialist appointment within 3-5 days of discharge Complete Complete Complete  HRI or Home Care Consult Complete Complete Complete  SW Recovery Care/Counseling Consult Complete Complete Complete  Palliative Care Screening Not Applicable Complete Complete  [place] Not Complete Complete Not Applicable  SNF Comments working on placement - -  Some recent data might be hidden

## 2020-12-12 NOTE — Progress Notes (Signed)
Patient transferring to bed 203 on 2C. Report called to nurse and patient ready for transfer.

## 2020-12-13 DIAGNOSIS — F4323 Adjustment disorder with mixed anxiety and depressed mood: Secondary | ICD-10-CM | POA: Diagnosis not present

## 2020-12-13 LAB — GLUCOSE, CAPILLARY
Glucose-Capillary: 54 mg/dL — ABNORMAL LOW (ref 70–99)
Glucose-Capillary: 55 mg/dL — ABNORMAL LOW (ref 70–99)
Glucose-Capillary: 75 mg/dL (ref 70–99)
Glucose-Capillary: 119 mg/dL — ABNORMAL HIGH (ref 70–99)
Glucose-Capillary: 96 mg/dL (ref 70–99)
Glucose-Capillary: 115 mg/dL — ABNORMAL HIGH (ref 70–99)

## 2020-12-13 LAB — PHOSPHORUS: Phosphorus: 2.5 mg/dL (ref 2.5–4.6)

## 2020-12-13 LAB — PREPARE RBC (CROSSMATCH)

## 2020-12-13 MED ORDER — RENA-VITE PO TABS
1.0000 | ORAL_TABLET | Freq: Every day | ORAL | Status: DC
  Administered 2020-12-13 – 2020-12-17 (×4): 1 via ORAL
  Filled 2020-12-13 (×4): qty 1

## 2020-12-13 MED ORDER — EPOETIN ALFA 10000 UNIT/ML IJ SOLN
10000.0000 [IU] | INTRAMUSCULAR | Status: DC
  Administered 2020-12-15: 10000 [IU] via INTRAVENOUS

## 2020-12-13 MED ORDER — NEPRO/CARBSTEADY PO LIQD: 237.0000 mL | Freq: Two times a day (BID) | ORAL | Status: DC

## 2020-12-13 MED ORDER — SODIUM CHLORIDE 0.9% IV SOLUTION: Freq: Once | INTRAVENOUS | Status: AC

## 2020-12-13 NOTE — Progress Notes (Signed)
Patient son asked patient if nurse can give him call and update him. Called her son Rosan Calbert and updated provided. He also requested if night nurse can give him a call for update. Will notify night nurse

## 2020-12-13 NOTE — Progress Notes (Signed)
Blood transfusion completed and documented in the chart. Post transfusion vital signs see in the flowsheet. No reaction and change in condition noted during and after transfusion.

## 2020-12-13 NOTE — Progress Notes (Signed)
Inpatient Diabetes Program Recommendations  AACE/ADA: New Consensus Statement on Inpatient Glycemic Control (2015)  Target Ranges:  Prepandial:   less than 140 mg/dL      [facility] postprandial:   less than 180 mg/dL (1-2 hours)      Critically ill patients:  140 - 180 mg/dL   Lab Results  Component Value Date   GLUCAP 75 12/13/2020   HGBA1C 4.6 (L) 12/07/2020    Review of Glycemic Control Results for [name], [name] (MRN [id number]) as of 12/13/2020 09:56  Ref. Range 12/12/2020 17:02 12/12/2020 22:15 12/13/2020 07:22 12/13/2020 08:11 12/13/2020 08:56  Glucose-Capillary Latest Ref Range: 70 - 99 mg/dL 143 (H)  Novolog 1 unit 134 (H) 54 (L) 55 (L) 75   Diabetes history: DM 2 Outpatient Diabetes medications:  None  Current orders for Inpatient glycemic control:  Novolog sensitive tid with meals and HS  Inpatient Diabetes Program Recommendations:    Consider holding Novolog correction and continue checking blood sugars.   Thanks  Adah Perl, RN, BC-ADM Inpatient Diabetes Coordinator Pager [phone] (8a-5p)

## 2020-12-13 NOTE — Progress Notes (Signed)
After about 10 minutes on dialysis machine. Patient started yelling out " my side and my chest is hurting called the doctor. UF turned off and dialysis paused.Colon Flattery, NP arrived to dialysis suite. ECG monitor abnormal heart rhythm. Patient became hypotensive and bradycardia. NP gave order to give Bolus 219ml of Normal Saline. Patient continues to be hypotensive, sinus bradycardia yelling. NP gave order to rinse patient back. After rinseback and ten minutes later patient Blood pressure became normal and heart rate showing Normal sinus rhythm.

## 2020-12-13 NOTE — Progress Notes (Signed)
Patient was unable to finish her dialysis due to having low BP and bradycardiac. Albumin supposed to be given in the dialysis, since the patient did not stay there more than 20 minutes. Dialysis nurse unable to start Albumin.   Albumin started in the room, per provider order Dr. Reesa Chew.

## 2020-12-13 NOTE — Progress Notes (Signed)
Initial Nutrition Assessment  DOCUMENTATION CODES:   Obesity unspecified  INTERVENTION:   Nepro Shake po BID, each supplement provides 425 kcal and 19 grams protein  Rena-vit po daily   Dysphagia 3 diet   NUTRITION DIAGNOSIS:   Increased nutrient needs related to chronic illness (ESRD on HD) as evidenced by estimated needs.  GOAL:   Patient will meet greater than or equal to 90% of their needs  MONITOR:   PO intake,Supplement acceptance,Labs,Weight trends,Skin,I & O's  REASON FOR ASSESSMENT:   Malnutrition Screening Tool    ASSESSMENT:   60 y.o. female  with past medical history of ESRD on HD, G1WE, Chronic diastolic HF, CAD, PVD, recent T7-T8 discitis/osteomyelitis tx with 6 week course of IV vancomycin and ceftazidime ending on 10/10/20, and atherosclerotic occlusive disease b/l UE s/p right brachial artery angioplasty on 11/27/20  admitted on 12/04/2020 with concerns of worsening abdominal and LE edema and pain.   Met with pt in room today. Pt is well known to this RD from numerous previous admits. Pt reports good appetite and oral intake at baseline. Pt reports that she does not like the hospital food as it is always cold and flavorless. Pt reports chronic diarrhea at home. Pt reports that she takes creon at home which she believes has slowed down the diarrhea but it still persists. Pt reports that she is being followed by GI for this and for possible gastroparesis as pt often get nauseas after eating and has a little food come back up into her esophagus. Pt reports eating 100% of her breakfast this morning which included french toast, bacon and fruit but pt only ate bites of her lunch. Pt reports that she eats protein bars after dialysis but that she does not drink any supplements at home. Pt does enjoy strawberry and butter pecan supplements while in hospital. Of note, pt does wear dentures. RD will add supplements and vitamins to help pt meet her estimated needs and replace  losses from HD. RD will change pt to a mechanical soft diet and add sodium restrictions via health touch. Per chart, pt appears weight stable pta.   Medications reviewed and include: epoetin, lasix, heparin, insulin, creon, protonix, renvela, B12, albumin   Labs reviewed: creat 3.92(H), K 3.8 wnl, P 2.5 wnl Wbc- 3.1(L), Hgb 7.3(L), Hct 22.8(L)  NUTRITION - FOCUSED PHYSICAL EXAM:  Flowsheet Row Most Recent Value  Orbital Region No depletion  Upper Arm Region No depletion  Thoracic and Lumbar Region No depletion  Buccal Region No depletion  Temple Region Mild depletion  Clavicle Bone Region No depletion  Clavicle and Acromion Bone Region No depletion  Scapular Bone Region No depletion  Dorsal Hand No depletion  Patellar Region No depletion  Anterior Thigh Region No depletion  Posterior Calf Region No depletion  Edema (RD Assessment) Mild  Hair Reviewed  Eyes Reviewed  Mouth Reviewed  Skin Reviewed  Nails Reviewed     Diet Order:   Diet Order            DIET DYS 3 Room service appropriate? Yes; Fluid consistency: Thin; Fluid restriction: 1200 mL Fluid  Diet effective now                EDUCATION NEEDS:   Education needs have been addressed  Skin:  Skin Assessment: Reviewed RN Assessment (Ecchymosis, skin tears)  Last BM:  3/23- TYPE 5  Height:   Ht Readings from Last 1 Encounters:  12/05/20 5' 5"  (1.651 m)  Weight:   Wt Readings from Last 1 Encounters:  12/12/20 83.5 kg    Ideal Body Weight:  56.8 kg  BMI:  Body mass index is 30.62 kg/m.  Estimated Nutritional Needs:   Kcal:  1800-2100kcal/day  Protein:  90-105g/day  Fluid:  UOP +1L  Koleen Distance MS, RD, LDN Please refer to [facility] for RD and/or RD on-call/weekend/after hours pager

## 2020-12-13 NOTE — Discharge Summary (Signed)
Physician Discharge Summary  [name] XBM:[id number] DOB: 10/31/1960 DOA: 12/04/2020  PCP: Denton Lank, MD  Admit date: 12/04/2020 Discharge date: 12/17/2020  Admitted From: Home Disposition: SNF   Recommendations for Outpatient Follow-up:  1. Follow up with PCP in 1-2 weeks 2. Please obtain BMP/CBC in one week 3. Please follow up on the following pending results:None  Home Health:No Equipment/Devices: Rolling walker Discharge Condition: Stable CODE STATUS: DNR Diet recommendation: Heart Healthy / Carb Modified   Brief/Interim Summary: [name] a 60 y.o.femalewith medical history significant forESRD on HD Tu/Thurs/Sat, DM, Chronic diastolic HF, CAD, PVD, recent T7-T8 discitis/osteomyelitis tx with 6 week course of IV vancomycin and ceftazidime ending on 10/10/20, atherosclerotic occlusive disease b/l UE s/p right brachial artery angioplasty on 11/27/20 who presents with concerns of worsening abdominal and LE edema and pain. Admitted for acute hypoxic respiratory failure secondary to pulmonary edema which has been resolved.  Patient now saturating well on room air.   Patient also has anasarca and chronic diastolic heart failure.  Appears hypervolemic.  Volume is being controlled with dialysis.  Patient also makes some urine so will continue with Lasix.  She received IV Lasix during hospitalization.  Volume status improved with multiple dialysis sessions.  Palliative care was also consulted due to multiple comorbidities, patient decided for DNR but continue current level of care and dialysis.  PT recommended rehab and patient is being discharged to rehab for further management.  Patient has an history of type 2 diabetes and developed couple of episodes of hypoglycemia requiring intervention.  A1c of 4.6.  Patient is not on any diabetic medications.Please encourage patient for frequent meals or snack in between.  Patient has an history of anemia of chronic disease.  Received 2 unit  of packed RBC.  Hemoglobin at 8.3.  She will continue with her iron supplement and EPO with dialysis.  Her home dose of carvedilol was discontinued due to asymptomatic bradycardia.  Patient also has an history of chronic depression and mood disorder, she was evaluated by psychiatrist and they did not recommend any medications at this time.  She will continue rest of her medications and follow-up with her providers.  Discharge Diagnoses:  Principal Problem:   Adjustment disorder with mixed anxiety and depressed mood Active Problems:   End-stage renal disease on hemodialysis (HCC)   HTN (hypertension)   Hypoglycemia associated with diabetes ([place])   Hypokalemia   Sinus bradycardia   Severe anemia   Anemia in ESRD (end-stage renal disease) (HCC)   Fluid overload   Chronic diastolic CHF (congestive heart failure) ([place])   Discharge Instructions  Discharge Instructions    Diet - low sodium heart healthy   Complete by: As directed    Increase activity slowly   Complete by: As directed    No wound care   Complete by: As directed      Allergies as of 12/17/2020      Reactions   Ace Inhibitors Swelling, Anaphylaxis   Ativan [lorazepam] Other (See Comments)   Reaction:Hallucinations and headaches   Compazine [prochlorperazine Edisylate] Anaphylaxis, Nausea And Vomiting, Other (See Comments)   Other reaction(s): dystonia from this vs. Reglan, 23 Jul - patient relates that she takes promethazine frequently with no problems   Sumatriptan Succinate Other (See Comments)   Other reaction(s): delirium and hallucinations per [facility] records   Zofran [ondansetron] Nausea And Vomiting   Per pt. she is allergic to zofran or will experience adverse reaction like hallucination    Losartan  Nausea Only   Prochlorperazine Other (See Comments)   Reaction:  Unknown . Patient does not remember reaction but she does have vertigo and anxiety along with n and v at times. Could be used to treat any of  these   Reglan [metoclopramide] Other (See Comments)   Per patient her Dr. Evelina Bucy her off it    Scopolamine Other (See Comments)   Dizziness, also has vertigo already   Tape Rash   Plastic tape causes rash   Tapentadol Rash   Ultrasound Gel Itching   Patient states the Ultrasound gel caused itching while in skin contact and resolved when wiped away.      Medication List    STOP taking these medications   calcium acetate 667 MG capsule Commonly known as: PHOSLO   carvedilol 6.25 MG tablet Commonly known as: COREG   cefTAZidime  IVPB Commonly known as: FORTAZ   promethazine 25 MG tablet Commonly known as: PHENERGAN   vancomycin  IVPB     TAKE these medications   albuterol (2.5 MG/3ML) 0.083% nebulizer solution Commonly known as: PROVENTIL Take 2.5 mg by nebulization every 4 (four) hours as needed for wheezing or shortness of breath.   albuterol 108 (90 Base) MCG/ACT inhaler Commonly known as: VENTOLIN HFA Inhale 2 puffs into the lungs every 4 (four) hours as needed for wheezing or shortness of breath.   alum & mag hydroxide-simeth 200-200-20 MG/5ML suspension Commonly known as: MAALOX/MYLANTA Take 15 mLs by mouth every 6 (six) hours as needed for indigestion or heartburn.   amLODipine 10 MG tablet Commonly known as: NORVASC Take 1 tablet (10 mg total) by mouth daily.   ammonium lactate 12 % lotion Commonly known as: LAC-HYDRIN Apply 1 application topically 2 (two) times daily as needed for dry skin.   aspirin EC 81 MG tablet Take 81 mg by mouth daily.   atorvastatin 20 MG tablet Commonly known as: LIPITOR Take 20 mg by mouth every evening.   calcium carbonate 500 MG chewable tablet Commonly known as: TUMS - dosed in mg elemental calcium Chew 2.5 tablets (500 mg of elemental calcium total) by mouth every 6 (six) hours as needed for indigestion.   cholestyramine light 4 g packet Commonly known as: PREVALITE Take 4 g by mouth 2 (two) times daily.   Creon  12000-38000 units Cpep capsule Generic drug: lipase/protease/amylase Take 36,000 Units by mouth 3 (three) times daily before meals.   cyanocobalamin 1000 MCG tablet Take 1,000 mcg by mouth daily.   diphenhydrAMINE 25 MG tablet Commonly known as: BENADRYL Take 25 mg by mouth every 6 (six) hours as needed for itching.   epoetin alfa 10000 UNIT/ML injection Commonly known as: EPOGEN Inject 1 mL (10,000 Units total) into the vein every Monday, Wednesday, and Friday with hemodialysis.   feeding supplement (NEPRO CARB STEADY) Liqd Take 237 mLs by mouth 2 (two) times daily between meals. What changed: when to take this   furosemide 80 MG tablet Commonly known as: LASIX Take 1 tablet by mouth daily.   gabapentin 100 MG capsule Commonly known as: NEURONTIN Take 100 mg by mouth every evening.   hydrALAZINE 50 MG tablet Commonly known as: APRESOLINE Take 1 tablet (50 mg total) by mouth every 8 (eight) hours.   HYDROcodone-acetaminophen 5-325 MG tablet Commonly known as: NORCO/VICODIN Take 1 tablet by mouth 3 (three) times daily as needed for moderate pain or severe pain. What changed: Another medication with the same name was removed. Continue taking this medication, and  follow the directions you see here.   hydrOXYzine 25 MG tablet Commonly known as: ATARAX/VISTARIL Take 25 mg by mouth daily.   Iron 325 (65 Fe) MG Tabs Take 325 mg by mouth daily.   meclizine 25 MG tablet Commonly known as: ANTIVERT Take 25 mg by mouth 4 (four) times daily as needed for dizziness.   megestrol 400 MG/10ML suspension Commonly known as: MEGACE Take 10 mLs (400 mg total) by mouth daily.   multivitamin Tabs tablet Take 1 tablet by mouth daily.   mupirocin ointment 2 % Commonly known as: BACTROBAN Apply 1 application topically daily as needed (leg rash).   nitroGLYCERIN 0.4 MG SL tablet Commonly known as: NITROSTAT Place 0.4 mg under the tongue every 5 (five) minutes as needed for chest  pain.   Omega-3 300 MG Caps Take 300 mg by mouth 2 (two) times daily. Every other day opposite omega XL   ondansetron 4 MG tablet Commonly known as: Zofran Take 1 tablet (4 mg total) by mouth every 8 (eight) hours as needed for nausea or vomiting.   pantoprazole 40 MG tablet Commonly known as: PROTONIX Take 40 mg by mouth daily.   polyethylene glycol 17 g packet Commonly known as: MIRALAX / GLYCOLAX Take 17 g by mouth daily.   PROBIOTIC PO Take 1 capsule by mouth daily.   sevelamer carbonate 800 MG tablet Commonly known as: RENVELA Take 800 mg by mouth 4 (four) times daily. (at meals and with a snack)   VITA-C PO Take 1 tablet by mouth daily.   Vitamin D3 25 MCG (1000 UT) Caps Take 1,000 Units by mouth daily.       Contact information for follow-up providers    Denton Lank, MD. Schedule an appointment as soon as possible for a visit.   Specialty: Family Medicine Contact information: [address] [address] [phone]        Corey Skains, MD .   Specialty: Cardiology Contact information: [address] [facility] [place] [address] [phone]            Contact information for after-discharge care    [facility] .   Service: Skilled Nursing Contact information: [place] [phone]                 Allergies  Allergen Reactions  . Ace Inhibitors Swelling and Anaphylaxis  . Ativan [Lorazepam] Other (See Comments)    Reaction:Hallucinations and headaches  . Compazine [Prochlorperazine Edisylate] Anaphylaxis, Nausea And Vomiting and Other (See Comments)    Other reaction(s): dystonia from this vs. Reglan, 23 Jul - patient relates that she takes promethazine frequently with no problems  . Sumatriptan Succinate Other (See Comments)    Other  reaction(s): delirium and hallucinations per [facility] records  . Zofran [Ondansetron] Nausea And Vomiting    Per pt. she is allergic to zofran or will experience adverse reaction like hallucination   . Losartan Nausea Only  . Prochlorperazine Other (See Comments)    Reaction:  Unknown . Patient does not remember reaction but she does have vertigo and anxiety along with n and v at times. Could be used to treat any of these   . Reglan [Metoclopramide] Other (See Comments)    Per patient her Dr. Evelina Bucy her off it   . Scopolamine Other (See Comments)    Dizziness, also has  vertigo already  . Tape Rash    Plastic tape causes rash  . Tapentadol Rash  . Ultrasound Gel Itching    Patient states the Ultrasound gel caused itching while in skin contact and resolved when wiped away.    Consultations:  Nephrology  Psychiatry  Procedures/Studies: DG Chest 2 View  Result Date: 11/21/2020 CLINICAL DATA:  Weakness, shortness of breath EXAM: CHEST - 2 VIEW COMPARISON:  09/17/2020 FINDINGS: Left dialysis catheter remains in place, unchanged. Cardiomegaly. Small bilateral pleural effusions. Diffuse interstitial prominence throughout the lungs. Bibasilar airspace opacities. Findings are similar to prior study. No acute bony abnormality. IMPRESSION: Diffuse interstitial prominence and bibasilar opacities could reflect edema. Small bilateral pleural effusions. Electronically Signed   By: Rolm Baptise M.D.   On: 11/21/2020 11:13   PERIPHERAL VASCULAR CATHETERIZATION  Result Date: 11/27/2020 See Op Note  DG Chest Portable 1 View  Result Date: 12/04/2020 CLINICAL DATA:  Shortness of breath. EXAM: PORTABLE CHEST 1 VIEW COMPARISON:  11/21/2020 FINDINGS: 1642 hours. The cardio pericardial silhouette is enlarged. Vascular congestion diffuse interstitial opacity is compatible with edema. Probable airspace edema in the lower lungs with small bilateral pleural effusions. Left IJ dialysis catheter tip is in the lower right  atrium. Subclavian vascular stent device evident. IMPRESSION: Cardiomegaly with pulmonary edema and small bilateral pleural effusions. Electronically Signed   By: Misty Stanley M.D.   On: 12/04/2020 16:53   [place] ASCITES (ABDOMEN LIMITED)  Result Date: 12/04/2020 CLINICAL DATA:  Ascites EXAM: LIMITED ABDOMEN ULTRASOUND FOR ASCITES TECHNIQUE: Limited ultrasound survey for ascites was performed in all four abdominal quadrants. COMPARISON:  10/29/2020 FINDINGS: Sonographic evaluation of the 4 quadrants of the abdomen are obtained. There is trace ascites within the bilateral upper quadrant surrounding the spleen and liver. Insufficient volume for paracentesis. IMPRESSION: 1. Trace ascites within the bilateral upper quadrants. Volume is insufficient for paracentesis. Electronically Signed   By: Randa Ngo M.D.   On: 12/04/2020 22:29    Subjective: Patient was seen and examined. No new complaints, showing progress while working with PT.eating breakfast. No N/V.  Discharge Exam: Vitals:   12/17/20 0408 12/17/20 0800  BP: (!) 142/64 (!) 133/59  Pulse: (!) 59 60  Resp: 16   Temp: 97.7 F (36.5 C) (!) 97.5 F (36.4 C)  SpO2: 100% 99%   Vitals:   12/16/20 1954 12/16/20 2334 12/17/20 0408 12/17/20 0800  BP: (!) 141/64 131/65 (!) 142/64 (!) 133/59  Pulse: 63 61 (!) 59 60  Resp: 20 20 16    Temp: 97.6 F (36.4 C) 97.7 F (36.5 C) 97.7 F (36.5 C) (!) 97.5 F (36.4 C)  TempSrc: Oral Oral Oral Oral  SpO2: 98% 100% 100% 99%  Weight:      Height:        General: Pt is alert, awake, not in acute distress Cardiovascular: RRR, S1/S2 +, no rubs, no gallops Respiratory: CTA bilaterally, no wheezing, no rhonchi Abdominal: Soft, NT, ND, bowel sounds + Extremities: Trace LE edema, no cyanosis   The results of significant diagnostics from this hospitalization (including imaging, microbiology, ancillary and laboratory) are listed below for reference.    Microbiology: Recent Results (from the past  240 hour(s))  SARS CORONAVIRUS 2 (TAT 6-24 HRS) Nasopharyngeal Nasopharyngeal Swab     Status: None   Collection Time: 12/12/20 11:15 AM   Specimen: Nasopharyngeal Swab  Result Value Ref Range Status   SARS Coronavirus 2 NEGATIVE NEGATIVE Final    Comment: (NOTE) SARS-CoV-2 target nucleic acids are NOT  DETECTED.  The SARS-CoV-2 RNA is generally detectable in upper and lower respiratory specimens during the acute phase of infection. Negative results do not preclude SARS-CoV-2 infection, do not rule out co-infections with other pathogens, and should not be used as the sole basis for treatment or other patient management decisions. Negative results must be combined with clinical observations, patient history, and epidemiological information. The expected result is Negative.  Fact Sheet for Patients: SugarRoll.be  Fact Sheet for Healthcare Providers: https://www.woods-mathews.com/  This test is not yet approved or cleared by the [place] FDA and  has been authorized for detection and/or diagnosis of SARS-CoV-2 by FDA under an Emergency Use Authorization (EUA). This EUA will remain  in effect (meaning this test can be used) for the duration of the COVID-19 declaration under Se ction 564(b)(1) of the Act, 21 U.S.C. section 360bbb-3(b)(1), unless the authorization is terminated or revoked sooner.  Performed at [facility], [place] [address], [place], [place] [address]      Labs: BNP (last 3 results) Recent Labs    09/17/20 1124 09/18/20 1506 12/04/20 1611  BNP 431.2* 742.3* 010.2*   Basic Metabolic Panel: Recent Labs  Lab 12/11/20 1557 12/13/20 1055 12/14/20 1100  NA 140  --  138  K 3.8  --  4.4  CL 91*  --  88*  CO2 40*  --  38*  GLUCOSE 93  --  92  BUN 17  --  18  CREATININE 3.92*  --  3.96*  CALCIUM 8.3*  --  8.4*  PHOS 2.3* 2.5  --    Liver Function Tests: Recent Labs  Lab 12/11/20 1557  ALBUMIN 2.6*    No results for input(s): LIPASE, AMYLASE in the last 168 hours. No results for input(s): AMMONIA in the last 168 hours. CBC: Recent Labs  Lab 12/11/20 1557 12/14/20 1100  WBC 3.1* 5.8  HGB 7.3* 8.3*  HCT 22.8* 24.6*  MCV 84.8 82.8  PLT 104* 115*   Cardiac Enzymes: No results for input(s): CKTOTAL, CKMB, CKMBINDEX, TROPONINI in the last 168 hours. BNP: Invalid input(s): POCBNP CBG: Recent Labs  Lab 12/15/20 2252 12/15/20 2339 12/16/20 0811 12/16/20 1308 12/17/20 0846  GLUCAP 57* 88 66* 108* 61*   D-Dimer No results for input(s): DDIMER in the last 72 hours. Hgb A1c No results for input(s): HGBA1C in the last 72 hours. Lipid Profile No results for input(s): CHOL, HDL, LDLCALC, TRIG, CHOLHDL, LDLDIRECT in the last 72 hours. Thyroid function studies No results for input(s): TSH, T4TOTAL, T3FREE, THYROIDAB in the last 72 hours.  Invalid input(s): FREET3 Anemia work up No results for input(s): VITAMINB12, FOLATE, FERRITIN, TIBC, IRON, RETICCTPCT in the last 72 hours. Urinalysis    Component Value Date/Time   COLORURINE YELLOW (A) 09/17/2020 1124   APPEARANCEUR CLOUDY (A) 09/17/2020 1124   APPEARANCEUR Clear 09/15/2014 0947   LABSPEC 1.011 09/17/2020 1124   LABSPEC 1.008 09/15/2014 0947   PHURINE 7.0 09/17/2020 1124   GLUCOSEU NEGATIVE 09/17/2020 1124   GLUCOSEU 150 mg/dL 09/15/2014 0947   HGBUR NEGATIVE 09/17/2020 1124   [place] 09/17/2020 1124   BILIRUBINUR Negative 09/15/2014 [place] 09/17/2020 1124   PROTEINUR >=300 (A) 09/17/2020 1124   UROBILINOGEN 0.2 06/30/2010 2130   NITRITE NEGATIVE 09/17/2020 1124   LEUKOCYTESUR NEGATIVE 09/17/2020 1124   LEUKOCYTESUR Negative 09/15/2014 0947   Sepsis Labs Invalid input(s): PROCALCITONIN,  WBC,  LACTICIDVEN Microbiology Recent Results (from the past 240 hour(s))  SARS CORONAVIRUS 2 (TAT 6-24  HRS) Nasopharyngeal Nasopharyngeal Swab     Status: None   Collection Time: 12/12/20 11:15  AM   Specimen: Nasopharyngeal Swab  Result Value Ref Range Status   SARS Coronavirus 2 NEGATIVE NEGATIVE Final    Comment: (NOTE) SARS-CoV-2 target nucleic acids are NOT DETECTED.  The SARS-CoV-2 RNA is generally detectable in upper and lower respiratory specimens during the acute phase of infection. Negative results do not preclude SARS-CoV-2 infection, do not rule out co-infections with other pathogens, and should not be used as the sole basis for treatment or other patient management decisions. Negative results must be combined with clinical observations, patient history, and epidemiological information. The expected result is Negative.  Fact Sheet for Patients: SugarRoll.be  Fact Sheet for Healthcare Providers: https://www.woods-mathews.com/  This test is not yet approved or cleared by the [place] FDA and  has been authorized for detection and/or diagnosis of SARS-CoV-2 by FDA under an Emergency Use Authorization (EUA). This EUA will remain  in effect (meaning this test can be used) for the duration of the COVID-19 declaration under Se ction 564(b)(1) of the Act, 21 U.S.C. section 360bbb-3(b)(1), unless the authorization is terminated or revoked sooner.  Performed at [facility], [place] [address], [place], [place] [address]     Time coordinating discharge: Over 30 minutes  SIGNED:  Lorella Nimrod, MD  [facility] 12/17/2020, 10:33 AM  If 7PM-7AM, please contact night-coverage [email or web address]  This record has been created using Systems analyst. Errors have been sought and corrected,but may not always be located. Such creation errors do not reflect on the standard of care.

## 2020-12-13 NOTE — Progress Notes (Signed)
OT Cancellation Note  Patient Details Name: [name] MRN: [id number] DOB: 1961/03/14   Cancelled Treatment:    Reason Eval/Treat Not Completed: Fatigue/lethargy limiting ability to participate;Medical issues which prohibited therapy, Chart reviewed. Upon arrival pt eating lunch and receiving blood transfusion. Pt deferring at this time. Will follow up later as able.   Dessie Coma, M.S. OTR/L  12/13/20, 3:15 PM  ascom [phone]

## 2020-12-13 NOTE — Progress Notes (Signed)
PROGRESS NOTE    [name]  JWJ:[id number] DOB: July 24, 1961 DOA: 12/04/2020 PCP: Denton Lank, MD   Brief Narrative: Taken from prior notes. [name] a 60 y.o.femalewith medical history significant forESRD on HD Tu/Thurs/Sat, DM, Chronic diastolic HF, CAD, PVD, recent T7-T8 discitis/osteomyelitis tx with 6 week course of IV vancomycin and ceftazidime ending on 10/10/20, atherosclerotic occlusive disease b/l UE s/p right brachial artery angioplasty on 11/27/20 who presents with concerns of worsening abdominal and LE edema and pain. Remained volume up, although improving.  Volume is being managed with dialysis. Patient now has a bed at [facility] authorization.  Covid test remain negative.  Unable to tolerate dialysis secondary to worsening hypotension.  Dialysis will be attempted again tomorrow.  Giving 1 unit of PRBC.  Subjective: Patient was having a lot of housekeeping complaints when seen today.  We discussed about getting a blood transfusion with dialysis and she agrees.  Assessment & Plan:   Principal Problem:   Adjustment disorder with mixed anxiety and depressed mood Active Problems:   End-stage renal disease on hemodialysis (HCC)   HTN (hypertension)   Hypoglycemia associated with diabetes (HCC)   Hypokalemia   Sinus bradycardia   Severe anemia   Anemia in ESRD (end-stage renal disease) (HCC)   Fluid overload   Chronic diastolic CHF (congestive heart failure) (HCC)  Acute hypoxic respiratory failure secondary to pulmonary edema.  Resolved.  Now saturating well on room air -Volume is mostly being controlled with dialysis.  Anasarca/chronic diastolic heart failure. -Continue with dialysis to control extra volume, improving but still appears up from her baseline. -Continue with IV Lasix- Will change to p.o. on discharge -Unable to tolerate dialysis today became hypotensive, treatment was discontinued and will be tried again  tomorrow.  Generalized weakness/multiple admissions.  Patient has more than 4 admissions in the past 21-month per chart review. Palliative care was consulted-wants to continue with dialysis and will continue with palliative care as an outpatient. -PT recommending SNF placement -TOC consult-patient has a bed offer from [facility] now, pending insurance authorization. -Overall poor prognosis.  Hypoglycemia with history of type 2 diabetes.  Secondary to poor p.o. intake.  A1c of 4.6. CBG decreased to 45 this morning which improved with 1 amp of dextrose and Sprite. -Continue to monitor -Encourage p.o. intake  Hypertension.  Blood pressure within goal. -Continue home dose of amlodipine and hydralazine -Continue with IV Lasix  Anemia of chronic kidney disease.  S/p 1 unit of PRBC. Hemoglobin Dropped to 7.2. -Ordered 1 more unit of PRBC. -Continue with Epogen -Continue with iron supplement -Continue to monitor  Asymptomatic bradycardia. -Keep holding Coreg.  History of chronic depression/mood disorder.  Not on any medication at this time.  She was seen by Dr. Weber Cooks.  Stage I obesity. Estimated body mass index is 30.62 kg/m as calculated from the following:   Height as of this encounter: 5\' 5"  (1.651 m).   Weight as of this encounter: 83.5 kg.  Objective: Vitals:   12/13/20 1145 12/13/20 1200 12/13/20 1308 12/13/20 1345  BP: (!) 130/56 (!) 128/56 (!) 115/48 (!) 119/45  Pulse: 62 (!) 59 63 (!) 59  Resp: 13 13 18 16   Temp:   (!) 97.4 F (36.3 C) 97.8 F (36.6 C)  TempSrc:   Oral Oral  SpO2: (!) 89% 94% 94% (!) 89%  Weight:      Height:        Intake/Output Summary (Last 24 hours) at 12/13/2020 1611 Last data  filed at 12/13/2020 1557 Gross per 24 hour  Intake 134.77 ml  Output -404 ml  Net 538.77 ml   Filed Weights   12/09/20 0247 12/11/20 1300 12/12/20 1149  Weight: 83.5 kg 88 kg 83.5 kg    Examination:  General.  In no acute distress. Pulmonary.  Lungs  clear bilaterally, normal respiratory effort. CV.  Regular rate and rhythm, no JVD, rub or murmur. Abdomen.  Soft, nontender, nondistended, BS positive. CNS.  Alert and oriented x3.  No focal neurologic deficit. Extremities.  1+ LE edema, no cyanosis, pulses intact and symmetrical. Psychiatry.  Judgment and insight appears impaired.  DVT prophylaxis: Heparin Code Status: DNR Family Communication: Discussed with son on phone. Disposition Plan:  Status is: Inpatient  Remains inpatient appropriate because:Inpatient level of care appropriate due to severity of illness   Dispo: The patient is from: Home              Anticipated d/c is to: SNF              Patient currently is not medically stable to d/c.   Difficult to place patient No              Level of care: Med-Surg  All the records are reviewed and case discussed with Care Management/Social Worker. Management plans discussed with the patient, nursing and they are in agreement.  Consultants:   Nephrology  Procedures:  Antimicrobials:   Data Reviewed: I have personally reviewed following labs and imaging studies  CBC: Recent Labs  Lab 12/07/20 0440 12/08/20 0425 12/11/20 1557  WBC 4.8 7.4 3.1*  HGB 7.9* 8.6* 7.3*  HCT 23.6* 27.3* 22.8*  MCV 83.7 85.8 84.8  PLT 76* 86* 350*   Basic Metabolic Panel: Recent Labs  Lab 12/07/20 0440 12/08/20 0425 12/11/20 1557 12/13/20 1055  NA 140 139 140  --   K 3.8 4.3 3.8  --   CL 93* 93* 91*  --   CO2 37* 34* 40*  --   GLUCOSE 71 51* 93  --   BUN 10 15 17   --   CREATININE 4.00* 4.51* 3.92*  --   CALCIUM 8.4* 8.6* 8.3*  --   PHOS  --   --  2.3* 2.5   GFR: Estimated Creatinine Clearance: 16.1 mL/min (A) (by C-G formula based on SCr of 3.92 mg/dL (H)). Liver Function Tests: Recent Labs  Lab 12/11/20 1557  ALBUMIN 2.6*   No results for input(s): LIPASE, AMYLASE in the last 168 hours. No results for input(s): AMMONIA in the last 168 hours. Coagulation Profile: No  results for input(s): INR, PROTIME in the last 168 hours. Cardiac Enzymes: No results for input(s): CKTOTAL, CKMB, CKMBINDEX, TROPONINI in the last 168 hours. BNP (last 3 results) No results for input(s): PROBNP in the last 8760 hours. HbA1C: No results for input(s): HGBA1C in the last 72 hours. CBG: Recent Labs  Lab 12/12/20 2215 12/13/20 0722 12/13/20 0811 12/13/20 0856 12/13/20 1140  GLUCAP 134* 54* 55* 75 119*   Lipid Profile: No results for input(s): CHOL, HDL, LDLCALC, TRIG, CHOLHDL, LDLDIRECT in the last 72 hours. Thyroid Function Tests: No results for input(s): TSH, T4TOTAL, FREET4, T3FREE, THYROIDAB in the last 72 hours. Anemia Panel: No results for input(s): VITAMINB12, FOLATE, FERRITIN, TIBC, IRON, RETICCTPCT in the last 72 hours. Sepsis Labs: No results for input(s): PROCALCITON, LATICACIDVEN in the last 168 hours.  Recent Results (from the past 240 hour(s))  Resp Panel by RT-PCR (Flu A&B, Covid) Nasopharyngeal  Swab     Status: None   Collection Time: 12/04/20  4:55 PM   Specimen: Nasopharyngeal Swab; Nasopharyngeal(NP) swabs in vial transport medium  Result Value Ref Range Status   SARS Coronavirus 2 by RT PCR NEGATIVE NEGATIVE Final    Comment: (NOTE) SARS-CoV-2 target nucleic acids are NOT DETECTED.  The SARS-CoV-2 RNA is generally detectable in upper respiratory specimens during the acute phase of infection. The lowest concentration of SARS-CoV-2 viral copies this assay can detect is 138 copies/mL. A negative result does not preclude SARS-Cov-2 infection and should not be used as the sole basis for treatment or other patient management decisions. A negative result may occur with  improper specimen collection/handling, submission of specimen other than nasopharyngeal swab, presence of viral mutation(s) within the areas targeted by this assay, and inadequate number of viral copies(<138 copies/mL). A negative result must be combined with clinical  observations, patient history, and epidemiological information. The expected result is Negative.  Fact Sheet for Patients:  EntrepreneurPulse.com.au  Fact Sheet for Healthcare Providers:  IncredibleEmployment.be  This test is no t yet approved or cleared by the [place] FDA and  has been authorized for detection and/or diagnosis of SARS-CoV-2 by FDA under an Emergency Use Authorization (EUA). This EUA will remain  in effect (meaning this test can be used) for the duration of the COVID-19 declaration under Section 564(b)(1) of the Act, 21 U.S.C.section 360bbb-3(b)(1), unless the authorization is terminated  or revoked sooner.       Influenza A by PCR NEGATIVE NEGATIVE Final   Influenza B by PCR NEGATIVE NEGATIVE Final    Comment: (NOTE) The Xpert Xpress SARS-CoV-2/FLU/RSV plus assay is intended as an aid in the diagnosis of influenza from Nasopharyngeal swab specimens and should not be used as a sole basis for treatment. Nasal washings and aspirates are unacceptable for Xpert Xpress SARS-CoV-2/FLU/RSV testing.  Fact Sheet for Patients: EntrepreneurPulse.com.au  Fact Sheet for Healthcare Providers: IncredibleEmployment.be  This test is not yet approved or cleared by the [place] FDA and has been authorized for detection and/or diagnosis of SARS-CoV-2 by FDA under an Emergency Use Authorization (EUA). This EUA will remain in effect (meaning this test can be used) for the duration of the COVID-19 declaration under Section 564(b)(1) of the Act, 21 U.S.C. section 360bbb-3(b)(1), unless the authorization is terminated or revoked.  Performed at [facility], [address] [place], [place] [address]   MRSA PCR Screening     Status: None   Collection Time: 12/05/20 10:32 AM   Specimen: Nasopharyngeal  Result Value Ref Range Status   MRSA by PCR NEGATIVE NEGATIVE Final    Comment:         The GeneXpert MRSA Assay (FDA approved for NASAL specimens only), is one component of a comprehensive MRSA colonization surveillance program. It is not intended to diagnose MRSA infection nor to guide or monitor treatment for MRSA infections. Performed at [facility], [place], [place] [address]   SARS CORONAVIRUS 2 (TAT 6-24 HRS) Nasopharyngeal Nasopharyngeal Swab     Status: None   Collection Time: 12/12/20 11:15 AM   Specimen: Nasopharyngeal Swab  Result Value Ref Range Status   SARS Coronavirus 2 NEGATIVE NEGATIVE Final    Comment: (NOTE) SARS-CoV-2 target nucleic acids are NOT DETECTED.  The SARS-CoV-2 RNA is generally detectable in upper and lower respiratory specimens during the acute phase of infection. Negative results do not preclude SARS-CoV-2 infection, do not rule out co-infections with other  pathogens, and should not be used as the sole basis for treatment or other patient management decisions. Negative results must be combined with clinical observations, patient history, and epidemiological information. The expected result is Negative.  Fact Sheet for Patients: SugarRoll.be  Fact Sheet for Healthcare Providers: https://www.woods-mathews.com/  This test is not yet approved or cleared by the [place] FDA and  has been authorized for detection and/or diagnosis of SARS-CoV-2 by FDA under an Emergency Use Authorization (EUA). This EUA will remain  in effect (meaning this test can be used) for the duration of the COVID-19 declaration under Se ction 564(b)(1) of the Act, 21 U.S.C. section 360bbb-3(b)(1), unless the authorization is terminated or revoked sooner.  Performed at [facility], [place] [address], [place], [place] [address]      Radiology Studies: No results found.  Scheduled Meds: . amLODipine  10 mg Oral Daily  . atorvastatin  20 mg Oral QPM  . Chlorhexidine Gluconate  Cloth  6 each Topical Daily  . cholestyramine light  4 g Oral BID  . [START ON 12/15/2020] epoetin (EPOGEN/PROCRIT) injection  10,000 Units Intravenous Q T,Th,Sa-HD  . [START ON 12/14/2020] feeding supplement (NEPRO CARB STEADY)  237 mL Oral BID BM  . furosemide  80 mg Intravenous Daily  . gabapentin  100 mg Oral QPM  . heparin  5,000 Units Subcutaneous Q8H  . hydrALAZINE  50 mg Oral Q8H  . hydrOXYzine  25 mg Oral Daily  . insulin aspart  0-5 Units Subcutaneous QHS  . insulin aspart  0-9 Units Subcutaneous TID WC  . lipase/protease/amylase  36,000 Units Oral TID AC  . multivitamin  1 tablet Oral QHS  . naphazoline-glycerin  1-2 drop Both Eyes BID  . pantoprazole  40 mg Oral Daily  . sevelamer carbonate  800 mg Oral TID WC  . cyanocobalamin  1,000 mcg Oral Daily   Continuous Infusions: . albumin human Stopped (12/13/20 1326)     LOS: 8 days   Time spent: 30 minutes. More than 50% of the time was spent in counseling/coordination of care  Lorella Nimrod, MD [facility]  If 7PM-7AM, please contact night-coverage [email or web address]  12/13/2020, 4:11 PM   This record has been created using Systems analyst. Errors have been sought and corrected,but may not always be located. Such creation errors do not reflect on the standard of care.

## 2020-12-13 NOTE — Plan of Care (Signed)

## 2020-12-13 NOTE — TOC Progression Note (Addendum)
Transition of Care [facility]) - Progression Note    Patient Details  Name: YANNIS GUMBS MRN: [id number] Date of Birth: 1961-01-03  Transition of Care [facility]) CM/SW Contact  Beverly Sessions, RN Phone Number: 12/13/2020, 10:56 AM  Clinical Narrative:     Phoebe Perch sent to Magda Paganini at [facility] per her request Therapy notes sent  Per Aura Dials still pending  Expected Discharge Plan: [place] Barriers to Discharge: Continued Medical Work up  Expected Discharge Plan and Services Expected Discharge Plan: [place] In-house Referral: Clinical Social Work   Post Acute Care Choice: [place] Living arrangements for the past 2 months: [place]                                       Social Determinants of Health (SDOH) Interventions    Readmission Risk Interventions Readmission Risk Prevention Plan 12/11/2020 09/21/2020 08/14/2020  Transportation Screening Complete Complete -  Medication Review Press photographer) Complete Complete -  PCP or Specialist appointment within 3-5 days of discharge Complete Complete Complete  HRI or Home Care Consult Complete Complete Complete  SW Recovery Care/Counseling Consult Complete Complete Complete  Palliative Care Screening Not Applicable Complete Complete  [place] Not Complete Complete Not Applicable  SNF Comments working on placement - -  Some recent data might be hidden

## 2020-12-13 NOTE — Progress Notes (Signed)
[facility], [place] 12/13/20  Subjective:   LOS: 8  Patient seen resting in bed Alert and oriented Says she has not received her vertigo medication Denies nausea and diarrhea Denies shortness of breath     Objective:  Vital signs in last 24 hours:  Temp:  [97.7 F (36.5 C)-98.2 F (36.8 C)] 97.7 F (36.5 C) (03/24 1045) Pulse Rate:  [59-67] 59 (03/24 1115) Resp:  [11-18] 11 (03/24 1115) BP: (116-140)/(46-118) 130/118 (03/24 1115) SpO2:  [90 %-100 %] 90 % (03/24 1115)  Weight change: -4.538 kg Filed Weights   12/09/20 0247 12/11/20 1300 12/12/20 1149  Weight: 83.5 kg 88 kg 83.5 kg    Intake/Output:    Intake/Output Summary (Last 24 hours) at 12/13/2020 1228 Last data filed at 12/13/2020 0844 Gross per 24 hour  Intake -  Output 0 ml  Net 0 ml     Physical Exam: General:  No acute distress  HEENT  moist oral mucous membranes  Pulm/lungs  Lungs clear, respiration symmetrical,unlabored  CVS/Heart  S1S2,no rubs or gallops  Abdomen:   Soft,non distended  Extremities:  1+ peripheral edema  Neurologic:  Alert, oriented able to answer questions  Skin:  No acute lesions or rashes  Access:  Left IJ PermCath       Basic Metabolic Panel:  Recent Labs  Lab 12/07/20 0440 12/08/20 0425 12/11/20 1557  NA 140 139 140  K 3.8 4.3 3.8  CL 93* 93* 91*  CO2 37* 34* 40*  GLUCOSE 71 51* 93  BUN 10 15 17   CREATININE 4.00* 4.51* 3.92*  CALCIUM 8.4* 8.6* 8.3*  PHOS  --   --  2.3*     CBC: Recent Labs  Lab 12/07/20 0440 12/08/20 0425 12/11/20 1557  WBC 4.8 7.4 3.1*  HGB 7.9* 8.6* 7.3*  HCT 23.6* 27.3* 22.8*  MCV 83.7 85.8 84.8  PLT 76* 86* 104*      Lab Results  Component Value Date   HEPBSAG NON REACTIVE 12/06/2020   HEPBIGM  06/12/2010    NEGATIVE (NOTE) High levels of Hepatitis B Core IgM antibody are detectable during the acute stage of Hepatitis B. This antibody is used to differentiate current from past HBV infection.        Microbiology:  Recent Results (from the past 240 hour(s))  Resp Panel by RT-PCR (Flu A&B, Covid) Nasopharyngeal Swab     Status: None   Collection Time: 12/04/20  4:55 PM   Specimen: Nasopharyngeal Swab; Nasopharyngeal(NP) swabs in vial transport medium  Result Value Ref Range Status   SARS Coronavirus 2 by RT PCR NEGATIVE NEGATIVE Final    Comment: (NOTE) SARS-CoV-2 target nucleic acids are NOT DETECTED.  The SARS-CoV-2 RNA is generally detectable in upper respiratory specimens during the acute phase of infection. The lowest concentration of SARS-CoV-2 viral copies this assay can detect is 138 copies/mL. A negative result does not preclude SARS-Cov-2 infection and should not be used as the sole basis for treatment or other patient management decisions. A negative result may occur with  improper specimen collection/handling, submission of specimen other than nasopharyngeal swab, presence of viral mutation(s) within the areas targeted by this assay, and inadequate number of viral copies(<138 copies/mL). A negative result must be combined with clinical observations, patient history, and epidemiological information. The expected result is Negative.  Fact Sheet for Patients:  EntrepreneurPulse.com.au  Fact Sheet for Healthcare Providers:  IncredibleEmployment.be  This test is no t yet approved or cleared by the [place]  States FDA and  has been authorized for detection and/or diagnosis of SARS-CoV-2 by FDA under an Emergency Use Authorization (EUA). This EUA will remain  in effect (meaning this test can be used) for the duration of the COVID-19 declaration under Section 564(b)(1) of the Act, 21 U.S.C.section 360bbb-3(b)(1), unless the authorization is terminated  or revoked sooner.       Influenza A by PCR NEGATIVE NEGATIVE Final   Influenza B by PCR NEGATIVE NEGATIVE Final    Comment: (NOTE) The Xpert Xpress SARS-CoV-2/FLU/RSV plus  assay is intended as an aid in the diagnosis of influenza from Nasopharyngeal swab specimens and should not be used as a sole basis for treatment. Nasal washings and aspirates are unacceptable for Xpert Xpress SARS-CoV-2/FLU/RSV testing.  Fact Sheet for Patients: EntrepreneurPulse.com.au  Fact Sheet for Healthcare Providers: IncredibleEmployment.be  This test is not yet approved or cleared by the [place] FDA and has been authorized for detection and/or diagnosis of SARS-CoV-2 by FDA under an Emergency Use Authorization (EUA). This EUA will remain in effect (meaning this test can be used) for the duration of the COVID-19 declaration under Section 564(b)(1) of the Act, 21 U.S.C. section 360bbb-3(b)(1), unless the authorization is terminated or revoked.  Performed at [facility], [address] [place], [place] [address]   MRSA PCR Screening     Status: None   Collection Time: 12/05/20 10:32 AM   Specimen: Nasopharyngeal  Result Value Ref Range Status   MRSA by PCR NEGATIVE NEGATIVE Final    Comment:        The GeneXpert MRSA Assay (FDA approved for NASAL specimens only), is one component of a comprehensive MRSA colonization surveillance program. It is not intended to diagnose MRSA infection nor to guide or monitor treatment for MRSA infections. Performed at [facility], [place], [place] [address]   SARS CORONAVIRUS 2 (TAT 6-24 HRS) Nasopharyngeal Nasopharyngeal Swab     Status: None   Collection Time: 12/12/20 11:15 AM   Specimen: Nasopharyngeal Swab  Result Value Ref Range Status   SARS Coronavirus 2 NEGATIVE NEGATIVE Final    Comment: (NOTE) SARS-CoV-2 target nucleic acids are NOT DETECTED.  The SARS-CoV-2 RNA is generally detectable in upper and lower respiratory specimens during the acute phase of infection. Negative results do not preclude SARS-CoV-2 infection, do not rule  out co-infections with other pathogens, and should not be used as the sole basis for treatment or other patient management decisions. Negative results must be combined with clinical observations, patient history, and epidemiological information. The expected result is Negative.  Fact Sheet for Patients: SugarRoll.be  Fact Sheet for Healthcare Providers: https://www.woods-mathews.com/  This test is not yet approved or cleared by the [place] FDA and  has been authorized for detection and/or diagnosis of SARS-CoV-2 by FDA under an Emergency Use Authorization (EUA). This EUA will remain  in effect (meaning this test can be used) for the duration of the COVID-19 declaration under Se ction 564(b)(1) of the Act, 21 U.S.C. section 360bbb-3(b)(1), unless the authorization is terminated or revoked sooner.  Performed at [facility], [place] [address] [place], [place] [address]     Coagulation Studies: No results for input(s): LABPROT, INR in the last 72 hours.  Urinalysis: No results for input(s): COLORURINE, LABSPEC, PHURINE, GLUCOSEU, HGBUR, BILIRUBINUR, KETONESUR, PROTEINUR, UROBILINOGEN, NITRITE, LEUKOCYTESUR in the last 72 hours.  Invalid input(s): APPERANCEUR    Imaging: No results found.   Medications:   . albumin human     .  sodium chloride   Intravenous Once  . amLODipine  10 mg Oral Daily  . atorvastatin  20 mg Oral QPM  . Chlorhexidine Gluconate Cloth  6 each Topical Daily  . cholestyramine light  4 g Oral BID  . epoetin (EPOGEN/PROCRIT) injection  4,000 Units Intravenous Q T,Th,Sa-HD  . furosemide  80 mg Intravenous Daily  . gabapentin  100 mg Oral QPM  . heparin  5,000 Units Subcutaneous Q8H  . hydrALAZINE  50 mg Oral Q8H  . hydrOXYzine  25 mg Oral Daily  . insulin aspart  0-5 Units Subcutaneous QHS  . insulin aspart  0-9 Units Subcutaneous TID WC  . lipase/protease/amylase  36,000 Units Oral TID AC  .  naphazoline-glycerin  1-2 drop Both Eyes BID  . pantoprazole  40 mg Oral Daily  . sevelamer carbonate  800 mg Oral TID WC  . cyanocobalamin  1,000 mcg Oral Daily   calcium carbonate, diphenhydrAMINE, HYDROcodone-acetaminophen, meclizine, sevelamer carbonate  Assessment/ Plan:  60 y.o. female with end-stage renal disease, on chronic diastolic CHF, diabetes type 2, thoracic discitis T7-8 completed treatment jan 2022 was admitted on 12/04/2020 for  Principal Problem:   Adjustment disorder with mixed anxiety and depressed mood Active Problems:   End-stage renal disease on hemodialysis (HCC)   HTN (hypertension)   Hypoglycemia associated with diabetes ([place])   Hypokalemia   Sinus bradycardia   Severe anemia   Anemia in ESRD (end-stage renal disease) (HCC)   Fluid overload   Chronic diastolic CHF (congestive heart failure) (HCC)  Chronic pulmonary edema [J81.1] Anasarca [R60.1] ESRD (end stage renal disease) ([place]) [N18.6] Fluid overload [E87.70] Ascites [R18.8] Severe anemia [D64.9]  [facility] dialysis/TTS 1/EDW 83.5 kg/left IJ PermCath/  #. ESRD with anasarca Scheduled for dialysis today UF goal 2-2.5 Next schedule treatment on Saturday Case management assisting with outpatient transportation needs to dialysis  Patient became hypotensive and bradycardic early in dialysis session. She complained of chest discomfort and nausea.  She was given 265ml bolus twice before recovering.  She was taken off dialysis today and will attempt again tomorrow  #. Anemia of CKD  Lab Results  Component Value Date   HGB 7.3 (L) 12/11/2020   Will increase to EPO 10000 units with dialysis    #. Secondary hyperparathyroidism of renal origin N 25.81      Component Value Date/Time   PTH 357 (H) 10/05/2018 1241   Lab Results  Component Value Date   PHOS 2.3 (L) 12/11/2020   Sevelamer 800 mg with meals  Pending labs   #. Diabetes type 2 with CKD Hemoglobin A1C (%)  Date Value   07/19/2013 9.3 (H)   Hgb A1c MFr Bld (%)  Date Value  12/07/2020 4.6 (L)    Diabetic management per primary team   LOS: [place] 3/24/202212:28 PM  [place], [place]

## 2020-12-14 DIAGNOSIS — I871 Compression of vein: Secondary | ICD-10-CM | POA: Insufficient documentation

## 2020-12-14 DIAGNOSIS — F4323 Adjustment disorder with mixed anxiety and depressed mood: Secondary | ICD-10-CM | POA: Diagnosis not present

## 2020-12-14 LAB — CBC
HCT: 24.6 % — ABNORMAL LOW (ref 36.0–46.0)
Hemoglobin: 8.3 g/dL — ABNORMAL LOW (ref 12.0–15.0)
MCH: 27.9 pg (ref 26.0–34.0)
MCHC: 33.7 g/dL (ref 30.0–36.0)
MCV: 82.8 fL (ref 80.0–100.0)
Platelets: 115 10*3/uL — ABNORMAL LOW (ref 150–400)
RBC: 2.97 MIL/uL — ABNORMAL LOW (ref 3.87–5.11)
RDW: 19.7 % — ABNORMAL HIGH (ref 11.5–15.5)
WBC: 5.8 10*3/uL (ref 4.0–10.5)
nRBC: 0 % (ref 0.0–0.2)

## 2020-12-14 LAB — BASIC METABOLIC PANEL
Anion gap: 12 (ref 5–15)
BUN: 18 mg/dL (ref 8–23)
CO2: 38 mmol/L — ABNORMAL HIGH (ref 22–32)
Calcium: 8.4 mg/dL — ABNORMAL LOW (ref 8.9–10.3)
Chloride: 88 mmol/L — ABNORMAL LOW (ref 98–111)
Creatinine, Ser: 3.96 mg/dL — ABNORMAL HIGH (ref 0.44–1.00)
GFR, Estimated: 12 mL/min — ABNORMAL LOW (ref >60)
Glucose, Bld: 92 mg/dL (ref 70–99)
Potassium: 4.4 mmol/L (ref 3.5–5.1)
Sodium: 138 mmol/L (ref 135–145)

## 2020-12-14 LAB — GLUCOSE, CAPILLARY
Glucose-Capillary: 89 mg/dL (ref 70–99)
Glucose-Capillary: 75 mg/dL (ref 70–99)
Glucose-Capillary: 68 mg/dL — ABNORMAL LOW (ref 70–99)
Glucose-Capillary: 96 mg/dL (ref 70–99)

## 2020-12-14 MED ORDER — ALBUMIN HUMAN 25 % IV SOLN
25.0000 g | Freq: Once | INTRAVENOUS | Status: AC
  Administered 2020-12-14: 25 g via INTRAVENOUS
  Filled 2020-12-14: qty 100

## 2020-12-14 NOTE — Progress Notes (Signed)
Hemodialysis patient known at [place] 10:45am. Patient has been transporting self to treatments and stated that due to increased weakness she has been having a hard time getting herself in and out of car and driving. Spoke with Hinton Dyer, [place] at dialysis clinic, about getting transportation set up. Hinton Dyer stated that this has been an on going problem due to insurance coverage. Due to the type of coverage it would cost $75 one way to ride with [facility] because of the type of Medicaid, they are not contracted with [facility], only with [facility]. [facility] states that they can not transport patient unless time is changed to MWF. Clinic has a MWF chair open but the time is for 12:15pm, to which [facility] states they can not accomodate that time. At present patient will be going to SNF for rehab, which will provide transportation. I asked Hinton Dyer if they could try and make arangements when the time comes for patient to return home, Hinton Dyer stated that they would try again. Please contact me with any other dialysis placement concerns.  Elvera Bicker Dialysis Coordinator [phone]

## 2020-12-14 NOTE — Progress Notes (Signed)
Rechecked blood sugar and it increased to 96.  Will continue to monitor.  [name]

## 2020-12-14 NOTE — Progress Notes (Signed)
PROGRESS NOTE    [name]  XBM:[id number] DOB: 1961-09-20 DOA: 12/04/2020 PCP: Denton Lank, MD   Brief Narrative: Taken from prior notes. [name] a 60 y.o.femalewith medical history significant forESRD on HD Tu/Thurs/Sat, DM, Chronic diastolic HF, CAD, PVD, recent T7-T8 discitis/osteomyelitis tx with 6 week course of IV vancomycin and ceftazidime ending on 10/10/20, atherosclerotic occlusive disease b/l UE s/p right brachial artery angioplasty on 11/27/20 who presents with concerns of worsening abdominal and LE edema and pain. Remained volume up, although improving.  Volume is being managed with dialysis. [name] now has a bed at [facility] authorization.  Covid test remain negative.  Unable to tolerate dialysis secondary to worsening hypotension.  [name] received 1 unit of PRBC yesterday with improvement in hemoglobin, will get her dialysis today.  Subjective: [name] was seen and examined today.  No new complaints.  Wants to walk with PT.  Assessment & Plan:   Principal Problem:   Adjustment disorder with mixed anxiety and depressed mood Active Problems:   End-stage renal disease on hemodialysis (HCC)   HTN (hypertension)   Hypoglycemia associated with diabetes (HCC)   Hypokalemia   Sinus bradycardia   Severe anemia   Anemia in ESRD (end-stage renal disease) (HCC)   Fluid overload   Chronic diastolic CHF (congestive heart failure) (HCC)  Acute hypoxic respiratory failure secondary to pulmonary edema.  Resolved.  Now saturating well on room air -Volume is mostly being controlled with dialysis.  Anasarca/chronic diastolic heart failure. -Continue with dialysis to control extra volume, improving but still appears up from her baseline. -Continue with IV Lasix- Will change to p.o. on discharge -Unable to tolerate dialysis yesterday secondary to hypotension, they will attempt dialyzing her again today.  Generalized weakness/multiple admissions.   [name] has more than 4 admissions in the past 46-month per chart review. Palliative care was consulted-wants to continue with dialysis and will continue with palliative care as an outpatient. -PT recommending SNF placement -TOC consult-[name] has a bed offer from [facility] now, pending insurance authorization. -Overall poor prognosis.  Hypoglycemia with history of type 2 diabetes.  Secondary to poor p.o. intake.  A1c of 4.6. CBG within goal today. -Continue to monitor -Encourage p.o. intake  Hypertension.  Blood pressure within goal. -Continue home dose of amlodipine and hydralazine -Continue with IV Lasix  Anemia of chronic kidney disease.  S/p 1 unit of PRBC. Hemoglobin Dropped to 7.2. -Ordered 1 more unit of PRBC. -Continue with Epogen -Continue with iron supplement -Continue to monitor  Asymptomatic bradycardia. -Keep holding Coreg.  History of chronic depression/mood disorder.  Not on any medication at this time.  She was seen by Dr. Weber Cooks.  Stage I obesity. Estimated body mass index is 30.62 kg/m as calculated from the following:   Height as of this encounter: 5\' 5"  (1.651 m).   Weight as of this encounter: 83.5 kg.  Objective: Vitals:   12/14/20 1315 12/14/20 1330 12/14/20 1345 12/14/20 1400  BP: (!) 152/68 (!) 142/65 135/62 (!) 141/60  Pulse: (!) 58 (!) 56 (!) 55 (!) 54  Resp: (!) 8 13 14 14   Temp:      TempSrc:      SpO2: 100% 100% 100% 100%  Weight:      Height:        Intake/Output Summary (Last 24 hours) at 12/14/2020 1508 Last data filed at 12/13/2020 1632 Gross per 24 hour  Intake 318.1 ml  Output --  Net 318.1 ml   [employer]  12/09/20 0247 12/11/20 1300 12/12/20 1149  Weight: 83.5 kg 88 kg 83.5 kg    Examination:  General.  Well-developed lady, in no acute distress. Pulmonary.  Lungs clear bilaterally, normal respiratory effort. CV.  Regular rate and rhythm, no JVD, rub or murmur. Abdomen.  Soft, nontender, nondistended, BS  positive. CNS.  Alert and oriented x3.  No focal neurologic deficit. Extremities.  1+ LE edema, no cyanosis, pulses intact and symmetrical. Psychiatry.  Judgment and insight appears impaired.  DVT prophylaxis: Heparin Code Status: DNR Family Communication: Discussed with son on phone. Disposition Plan:  Status is: Inpatient  Remains inpatient appropriate because:Inpatient level of care appropriate due to severity of illness   Dispo: The [name] is from: Home              Anticipated d/c is to: SNF              [name] currently is not medically stable to d/c.   Difficult to place [name] No              Level of care: Med-Surg  All the records are reviewed and case discussed with Care Management/Social Worker. Management plans discussed with the [name], nursing and they are in agreement.  Consultants:   Nephrology  Procedures:  Antimicrobials:   Data Reviewed: I have personally reviewed following labs and imaging studies  CBC: Recent Labs  Lab 12/08/20 0425 12/11/20 1557 12/14/20 1100  WBC 7.4 3.1* 5.8  HGB 8.6* 7.3* 8.3*  HCT 27.3* 22.8* 24.6*  MCV 85.8 84.8 82.8  PLT 86* 104* 240*   Basic Metabolic Panel: Recent Labs  Lab 12/08/20 0425 12/11/20 1557 12/13/20 1055 12/14/20 1100  NA 139 140  --  138  K 4.3 3.8  --  4.4  CL 93* 91*  --  88*  CO2 34* 40*  --  38*  GLUCOSE 51* 93  --  92  BUN 15 17  --  18  CREATININE 4.51* 3.92*  --  3.96*  CALCIUM 8.6* 8.3*  --  8.4*  PHOS  --  2.3* 2.5  --    GFR: Estimated Creatinine Clearance: 15.9 mL/min (A) (by C-G formula based on SCr of 3.96 mg/dL (H)). Liver Function Tests: Recent Labs  Lab 12/11/20 1557  ALBUMIN 2.6*   No results for input(s): LIPASE, AMYLASE in the last 168 hours. No results for input(s): AMMONIA in the last 168 hours. Coagulation Profile: No results for input(s): INR, PROTIME in the last 168 hours. Cardiac Enzymes: No results for input(s): CKTOTAL, CKMB, CKMBINDEX, TROPONINI in the  last 168 hours. BNP (last 3 results) No results for input(s): PROBNP in the last 8760 hours. HbA1C: No results for input(s): HGBA1C in the last 72 hours. CBG: Recent Labs  Lab 12/13/20 0856 12/13/20 1140 12/13/20 1650 12/13/20 2134 12/14/20 0754  GLUCAP 75 119* 96 115* 89   Lipid Profile: No results for input(s): CHOL, HDL, LDLCALC, TRIG, CHOLHDL, LDLDIRECT in the last 72 hours. Thyroid Function Tests: No results for input(s): TSH, T4TOTAL, FREET4, T3FREE, THYROIDAB in the last 72 hours. Anemia Panel: No results for input(s): VITAMINB12, FOLATE, FERRITIN, TIBC, IRON, RETICCTPCT in the last 72 hours. Sepsis Labs: No results for input(s): PROCALCITON, LATICACIDVEN in the last 168 hours.  Recent Results (from the past 240 hour(s))  Resp Panel by RT-PCR (Flu A&B, Covid) Nasopharyngeal Swab     Status: None   Collection Time: 12/04/20  4:55 PM   Specimen: Nasopharyngeal Swab; Nasopharyngeal(NP) swabs in vial  transport medium  Result Value Ref Range Status   SARS Coronavirus 2 by RT PCR NEGATIVE NEGATIVE Final    Comment: (NOTE) SARS-CoV-2 target nucleic acids are NOT DETECTED.  The SARS-CoV-2 RNA is generally detectable in upper respiratory specimens during the acute phase of infection. The lowest concentration of SARS-CoV-2 viral copies this assay can detect is 138 copies/mL. A negative result does not preclude SARS-Cov-2 infection and should not be used as the sole basis for treatment or other [name] management decisions. A negative result may occur with  improper specimen collection/handling, submission of specimen other than nasopharyngeal swab, presence of viral mutation(s) within the areas targeted by this assay, and inadequate number of viral copies(<138 copies/mL). A negative result must be combined with clinical observations, [name] history, and epidemiological information. The expected result is Negative.  Fact Sheet for Patients:   EntrepreneurPulse.com.au  Fact Sheet for Healthcare Providers:  IncredibleEmployment.be  This test is no t yet approved or cleared by the [place] FDA and  has been authorized for detection and/or diagnosis of SARS-CoV-2 by FDA under an Emergency Use Authorization (EUA). This EUA will remain  in effect (meaning this test can be used) for the duration of the COVID-19 declaration under Section 564(b)(1) of the Act, 21 U.S.C.section 360bbb-3(b)(1), unless the authorization is terminated  or revoked sooner.       Influenza A by PCR NEGATIVE NEGATIVE Final   Influenza B by PCR NEGATIVE NEGATIVE Final    Comment: (NOTE) The Xpert Xpress SARS-CoV-2/FLU/RSV plus assay is intended as an aid in the diagnosis of influenza from Nasopharyngeal swab specimens and should not be used as a sole basis for treatment. Nasal washings and aspirates are unacceptable for Xpert Xpress SARS-CoV-2/FLU/RSV testing.  Fact Sheet for Patients: EntrepreneurPulse.com.au  Fact Sheet for Healthcare Providers: IncredibleEmployment.be  This test is not yet approved or cleared by the [place] FDA and has been authorized for detection and/or diagnosis of SARS-CoV-2 by FDA under an Emergency Use Authorization (EUA). This EUA will remain in effect (meaning this test can be used) for the duration of the COVID-19 declaration under Section 564(b)(1) of the Act, 21 U.S.C. section 360bbb-3(b)(1), unless the authorization is terminated or revoked.  Performed at [facility], [address] [place], [place] [address]   MRSA PCR Screening     Status: None   Collection Time: 12/05/20 10:32 AM   Specimen: Nasopharyngeal  Result Value Ref Range Status   MRSA by PCR NEGATIVE NEGATIVE Final    Comment:        The GeneXpert MRSA Assay (FDA approved for NASAL specimens only), is one component of a comprehensive MRSA  colonization surveillance program. It is not intended to diagnose MRSA infection nor to guide or monitor treatment for MRSA infections. Performed at [facility], [place], [place] [address]   SARS CORONAVIRUS 2 (TAT 6-24 HRS) Nasopharyngeal Nasopharyngeal Swab     Status: None   Collection Time: 12/12/20 11:15 AM   Specimen: Nasopharyngeal Swab  Result Value Ref Range Status   SARS Coronavirus 2 NEGATIVE NEGATIVE Final    Comment: (NOTE) SARS-CoV-2 target nucleic acids are NOT DETECTED.  The SARS-CoV-2 RNA is generally detectable in upper and lower respiratory specimens during the acute phase of infection. Negative results do not preclude SARS-CoV-2 infection, do not rule out co-infections with other pathogens, and should not be used as the sole basis for treatment or other [name] management decisions. Negative results must be combined with clinical  observations, [name] history, and epidemiological information. The expected result is Negative.  Fact Sheet for Patients: SugarRoll.be  Fact Sheet for Healthcare Providers: https://www.woods-mathews.com/  This test is not yet approved or cleared by the [place] FDA and  has been authorized for detection and/or diagnosis of SARS-CoV-2 by FDA under an Emergency Use Authorization (EUA). This EUA will remain  in effect (meaning this test can be used) for the duration of the COVID-19 declaration under Se ction 564(b)(1) of the Act, 21 U.S.C. section 360bbb-3(b)(1), unless the authorization is terminated or revoked sooner.  Performed at [facility], [place] [address], [place], [place] [address]      Radiology Studies: No results found.  Scheduled Meds: . amLODipine  10 mg Oral Daily  . atorvastatin  20 mg Oral QPM  . Chlorhexidine Gluconate Cloth  6 each Topical Daily  . cholestyramine light  4 g Oral BID  . [START ON 12/15/2020] epoetin  (EPOGEN/PROCRIT) injection  10,000 Units Intravenous Q T,Th,Sa-HD  . feeding supplement (NEPRO CARB STEADY)  237 mL Oral BID BM  . furosemide  80 mg Intravenous Daily  . gabapentin  100 mg Oral QPM  . heparin  5,000 Units Subcutaneous Q8H  . hydrALAZINE  50 mg Oral Q8H  . hydrOXYzine  25 mg Oral Daily  . insulin aspart  0-5 Units Subcutaneous QHS  . insulin aspart  0-9 Units Subcutaneous TID WC  . lipase/protease/amylase  36,000 Units Oral TID AC  . multivitamin  1 tablet Oral QHS  . naphazoline-glycerin  1-2 drop Both Eyes BID  . pantoprazole  40 mg Oral Daily  . sevelamer carbonate  800 mg Oral TID WC  . cyanocobalamin  1,000 mcg Oral Daily   Continuous Infusions: . albumin human Stopped (12/13/20 1326)     LOS: 9 days   Time spent: 25 minutes. More than 50% of the time was spent in counseling/coordination of care  Lorella Nimrod, MD [facility]  If 7PM-7AM, please contact night-coverage [email or web address]  12/14/2020, 3:08 PM   This record has been created using Systems analyst. Errors have been sought and corrected,but may not always be located. Such creation errors do not reflect on the standard of care.

## 2020-12-14 NOTE — Care Management Important Message (Signed)
Important Message  Patient Details  Name: [name] MRN: [id number] Date of Birth: 14-Mar-1961   Medicare Important Message Given:  Other (see comment)  Attempted to reach patient to review Medicare IM via room phone and cell phone due to isolation status.  Unable to reach by room phone.  Left message on cell to review Medicare IM, encouraged callback.    Dannette Barbara 12/14/2020, 1:46 PM

## 2020-12-14 NOTE — Progress Notes (Signed)
OT Cancellation Note  Patient Details Name: RETA NORGREN MRN: [id number] DOB: June 11, 1961   Cancelled Treatment:    Reason Eval/Treat Not Completed: Patient declined, no reason specified. Pt perseverating on her dissatisfaction with her food, refused to participate in therapy until she received another meal. Will try again at a later date as pt is available and willing.  Josiah Lobo, PhD, MS, OTR/L 12/14/20, 2:58 PM

## 2020-12-14 NOTE — TOC Progression Note (Signed)
Transition of Care [facility]) - Progression Note    Patient Details  Name: MERARI PION MRN: [id number] Date of Birth: 06-26-1961  Transition [facility] Care [facility] CM/SW Contact  Beverly Sessions, RN Phone Number: 12/14/2020, 9:29 AM  Clinical Narrative:     Per Magda Paganini at [facility] still pending   Expected Discharge Plan: [place] Barriers to Discharge: Continued Medical Work up  Expected Discharge Plan and Services Expected Discharge Plan: [place] In-house Referral: Clinical Social Work   Post Acute Care Choice: [place] Living arrangements for the past 2 months: [place]                                       Social Determinants of Health (SDOH) Interventions    Readmission Risk Interventions Readmission Risk Prevention Plan 12/11/2020 09/21/2020 08/14/2020  Transportation Screening Complete Complete -  Medication Review [employer]) Complete Complete -  PCP or Specialist appointment within 3-5 days of discharge Complete Complete Complete  HRI or Home Care Consult Complete Complete Complete  SW Recovery Care/Counseling Consult Complete Complete Complete  Palliative Care Screening Not Applicable Complete Complete  [place] Not Complete Complete Not Applicable  SNF Comments working on placement - -  Some recent data might be hidden

## 2020-12-14 NOTE — Progress Notes (Signed)
[facility]  Pt ran full treatment, uneventful. No hypotension , hr min bradycardia. Goal weight achieved with help of albumin.  Elberta Leatherwood

## 2020-12-14 NOTE — Progress Notes (Signed)
[facility], [place] 12/14/20  Subjective:   LOS: 9  Patient seen during dialysis   HEMODIALYSIS FLOWSHEET:  Blood Flow Rate (mL/min): 400 mL/min Arterial Pressure (mmHg): -140 mmHg Venous Pressure (mmHg): 130 mmHg Transmembrane Pressure (mmHg): 60 mmHg Ultrafiltration Rate (mL/min): 830 mL/min Dialysate Flow Rate (mL/min): 500 ml/min Conductivity: Machine : 14 Conductivity: Machine : 14 Dialysis Fluid Bolus: Normal Saline Bolus Amount (mL): 200 mL Dialysate Change:  (3k bath as ordered)  Alert and oriented  Denies nausea and shortness of breath     Objective:  Vital signs in last 24 hours:  Temp:  [97.4 F (36.3 C)-97.8 F (36.6 C)] 97.6 F (36.4 C) (03/25 1100) Pulse Rate:  [59-67] 60 (03/25 1200) Resp:  [12-18] 18 (03/25 0354) BP: (115-156)/(45-95) 154/73 (03/25 1200) SpO2:  [89 %-100 %] 100 % (03/25 1200)  Weight change:  Filed Weights   12/09/20 0247 12/11/20 1300 12/12/20 1149  Weight: 83.5 kg 88 kg 83.5 kg    Intake/Output:    Intake/Output Summary (Last 24 hours) at 12/14/2020 1216 Last data filed at 12/13/2020 1632 Gross per 24 hour  Intake 318.1 ml  Output -  Net 318.1 ml     Physical Exam: General:  No acute distress  HEENT  moist oral mucous membranes  Pulm/lungs  Lungs clear, respiration symmetrical,unlabored  CVS/Heart  S1S2,no rubs or gallops  Abdomen:   Soft,non distended  Extremities:  1+ peripheral edema  Neurologic:  Alert, oriented able to answer questions  Skin:  No acute lesions or rashes  Access:  Left IJ PermCath       Basic Metabolic Panel:  Recent Labs  Lab 12/08/20 0425 12/11/20 1557 12/13/20 1055 12/14/20 1100  NA 139 140  --  138  K 4.3 3.8  --  4.4  CL 93* 91*  --  88*  CO2 34* 40*  --  38*  GLUCOSE 51* 93  --  92  BUN 15 17  --  18  CREATININE 4.51* 3.92*  --  3.96*  CALCIUM 8.6* 8.3*  --  8.4*  PHOS  --  2.3* 2.5  --      CBC: Recent Labs  Lab 12/08/20 0425  12/11/20 1557 12/14/20 1100  WBC 7.4 3.1* 5.8  HGB 8.6* 7.3* 8.3*  HCT 27.3* 22.8* 24.6*  MCV 85.8 84.8 82.8  PLT 86* 104* 115*      Lab Results  Component Value Date   HEPBSAG NON REACTIVE 12/06/2020   HEPBIGM  06/12/2010    NEGATIVE (NOTE) High levels of Hepatitis B Core IgM antibody are detectable during the acute stage of Hepatitis B. This antibody is used to differentiate current from past HBV infection.       Microbiology:  Recent Results (from the past 240 hour(s))  Resp Panel by RT-PCR (Flu A&B, Covid) Nasopharyngeal Swab     Status: None   Collection Time: 12/04/20  4:55 PM   Specimen: Nasopharyngeal Swab; Nasopharyngeal(NP) swabs in vial transport medium  Result Value Ref Range Status   SARS Coronavirus 2 by RT PCR NEGATIVE NEGATIVE Final    Comment: (NOTE) SARS-CoV-2 target nucleic acids are NOT DETECTED.  The SARS-CoV-2 RNA is generally detectable in upper respiratory specimens during the acute phase of infection. The lowest concentration of SARS-CoV-2 viral copies this assay can detect is 138 copies/mL. A negative result does not preclude SARS-Cov-2 infection and should not be used as the sole basis for treatment or other patient management decisions. A negative result  may occur with  improper specimen collection/handling, submission of specimen other than nasopharyngeal swab, presence of viral mutation(s) within the areas targeted by this assay, and inadequate number of viral copies(<138 copies/mL). A negative result must be combined with clinical observations, patient history, and epidemiological information. The expected result is Negative.  Fact Sheet for Patients:  EntrepreneurPulse.com.au  Fact Sheet for Healthcare Providers:  IncredibleEmployment.be  This test is no t yet approved or cleared by the [place] FDA and  has been authorized for detection and/or diagnosis of SARS-CoV-2 by FDA under an Emergency  Use Authorization (EUA). This EUA will remain  in effect (meaning this test can be used) for the duration of the COVID-19 declaration under Section 564(b)(1) of the Act, 21 U.S.C.section 360bbb-3(b)(1), unless the authorization is terminated  or revoked sooner.       Influenza A by PCR NEGATIVE NEGATIVE Final   Influenza B by PCR NEGATIVE NEGATIVE Final    Comment: (NOTE) The Xpert Xpress SARS-CoV-2/FLU/RSV plus assay is intended as an aid in the diagnosis of influenza from Nasopharyngeal swab specimens and should not be used as a sole basis for treatment. Nasal washings and aspirates are unacceptable for Xpert Xpress SARS-CoV-2/FLU/RSV testing.  Fact Sheet for Patients: EntrepreneurPulse.com.au  Fact Sheet for Healthcare Providers: IncredibleEmployment.be  This test is not yet approved or cleared by the [place] FDA and has been authorized for detection and/or diagnosis of SARS-CoV-2 by FDA under an Emergency Use Authorization (EUA). This EUA will remain in effect (meaning this test can be used) for the duration of the COVID-19 declaration under Section 564(b)(1) of the Act, 21 U.S.C. section 360bbb-3(b)(1), unless the authorization is terminated or revoked.  Performed at [facility], [address] [place], [place] [address]   MRSA PCR Screening     Status: None   Collection Time: 12/05/20 10:32 AM   Specimen: Nasopharyngeal  Result Value Ref Range Status   MRSA by PCR NEGATIVE NEGATIVE Final    Comment:        The GeneXpert MRSA Assay (FDA approved for NASAL specimens only), is one component of a comprehensive MRSA colonization surveillance program. It is not intended to diagnose MRSA infection nor to guide or monitor treatment for MRSA infections. Performed at [facility], [place], [place] [address]   SARS CORONAVIRUS 2 (TAT 6-24 HRS) Nasopharyngeal Nasopharyngeal Swab     Status:  None   Collection Time: 12/12/20 11:15 AM   Specimen: Nasopharyngeal Swab  Result Value Ref Range Status   SARS Coronavirus 2 NEGATIVE NEGATIVE Final    Comment: (NOTE) SARS-CoV-2 target nucleic acids are NOT DETECTED.  The SARS-CoV-2 RNA is generally detectable in upper and lower respiratory specimens during the acute phase of infection. Negative results do not preclude SARS-CoV-2 infection, do not rule out co-infections with other pathogens, and should not be used as the sole basis for treatment or other patient management decisions. Negative results must be combined with clinical observations, patient history, and epidemiological information. The expected result is Negative.  Fact Sheet for Patients: SugarRoll.be  Fact Sheet for Healthcare Providers: https://www.woods-mathews.com/  This test is not yet approved or cleared by the [place] FDA and  has been authorized for detection and/or diagnosis of SARS-CoV-2 by FDA under an Emergency Use Authorization (EUA). This EUA will remain  in effect (meaning this test can be used) for the duration of the COVID-19 declaration under Se ction 564(b)(1) of the Act, 21 U.S.C. section 360bbb-3(b)(1), unless  the authorization is terminated or revoked sooner.  Performed at [facility], [place] [address], [place], [place] [address]     Coagulation Studies: No results for input(s): LABPROT, INR in the last 72 hours.  Urinalysis: No results for input(s): COLORURINE, LABSPEC, PHURINE, GLUCOSEU, HGBUR, BILIRUBINUR, KETONESUR, PROTEINUR, UROBILINOGEN, NITRITE, LEUKOCYTESUR in the last 72 hours.  Invalid input(s): APPERANCEUR    Imaging: No results found.   Medications:   . albumin human Stopped (12/13/20 1326)   . amLODipine  10 mg Oral Daily  . atorvastatin  20 mg Oral QPM  . Chlorhexidine Gluconate Cloth  6 each Topical Daily  . cholestyramine light  4 g Oral BID  . [START ON  12/15/2020] epoetin (EPOGEN/PROCRIT) injection  10,000 Units Intravenous Q T,Th,Sa-HD  . feeding supplement (NEPRO CARB STEADY)  237 mL Oral BID BM  . furosemide  80 mg Intravenous Daily  . gabapentin  100 mg Oral QPM  . heparin  5,000 Units Subcutaneous Q8H  . hydrALAZINE  50 mg Oral Q8H  . hydrOXYzine  25 mg Oral Daily  . insulin aspart  0-5 Units Subcutaneous QHS  . insulin aspart  0-9 Units Subcutaneous TID WC  . lipase/protease/amylase  36,000 Units Oral TID AC  . multivitamin  1 tablet Oral QHS  . naphazoline-glycerin  1-2 drop Both Eyes BID  . pantoprazole  40 mg Oral Daily  . sevelamer carbonate  800 mg Oral TID WC  . cyanocobalamin  1,000 mcg Oral Daily   calcium carbonate, diphenhydrAMINE, HYDROcodone-acetaminophen, meclizine, sevelamer carbonate  Assessment/ Plan:  60 y.o. female with end-stage renal disease, on chronic diastolic CHF, diabetes type 2, thoracic discitis T7-8 completed treatment jan 2022 was admitted on 12/04/2020 for  Principal Problem:   Adjustment disorder with mixed anxiety and depressed mood Active Problems:   End-stage renal disease on hemodialysis (HCC)   HTN (hypertension)   Hypoglycemia associated with diabetes ([place])   Hypokalemia   Sinus bradycardia   Severe anemia   Anemia in ESRD (end-stage renal disease) (HCC)   Fluid overload   Chronic diastolic CHF (congestive heart failure) (HCC)  Chronic pulmonary edema [J81.1] Anasarca [R60.1] ESRD (end stage renal disease) ([place]) [N18.6] Fluid overload [E87.70] Ascites [R18.8] Severe anemia [D64.9]  [facility] dialysis/TTS 1/EDW 83.5 kg/left IJ PermCath/  #. ESRD with anasarca Scheduled for dialysis today UF goal 2 Next schedule treatment on Saturday to maintain outpatient schedule Outpatient transportation needs being managed by case management   #. Anemia of CKD  Lab Results  Component Value Date   HGB 8.3 (L) 12/14/2020   Increased EPO 10000 units with dialysis    #. Secondary  hyperparathyroidism of renal origin N 25.81      Component Value Date/Time   PTH 357 (H) 10/05/2018 1241   Lab Results  Component Value Date   PHOS 2.5 12/13/2020   Sevelamer 800 mg with meals  Acceptable range acheived   #. Diabetes type 2 with CKD Hemoglobin A1C (%)  Date Value  07/19/2013 9.3 (H)   Hgb A1c MFr Bld (%)  Date Value  12/07/2020 4.6 (L)   Stable Diabetic management per primary team   LOS: [place] 3/25/202212:16 PM  [place], [place]

## 2020-12-14 NOTE — Progress Notes (Signed)
Patient's blood sugar was 68. Given sandwich tray and snack. Will recheck CBG within the hour.  Christene Slates  12/14/2020  9:43 PM

## 2020-12-14 NOTE — Care Management Important Message (Signed)
Important Message  Patient Details  Name: KORTNE ALL MRN: [id number] Date of Birth: 02-17-1961   Medicare Important Message Given:  Yes  Patient returned call.  Reviewed Medicare IM.  Copy to be delivered to patient's room via nursing staff.    Dannette Barbara 12/14/2020, 2:45 PM

## 2020-12-14 NOTE — Progress Notes (Deleted)
MRN : [id number]  [name] is a 60 y.o. ([birth year]) female who presents with chief complaint of No chief complaint on file. Marland Kitchen  History of Present Illness:    The patient is seen for evaluation of dialysis access.  The patient has a history of multiple failed accesses.  There have been accesses in both arms and in the thighs.    Current access is via a catheter which is functioning poorly.  There have been several episodes of catheter infection.  The patient denies amaurosis fugax or recent TIA symptoms. There are no recent neurological changes noted. The patient denies claudication symptoms or rest pain symptoms. The patient denies history of DVT, PE or superficial thrombophlebitis. The patient denies recent episodes of angina or shortness of breath.     No outpatient medications have been marked as taking for the 12/17/20 encounter (Appointment) with Delana Meyer, Dolores Lory, MD.    Past Medical History:  Diagnosis Date  . Anemia   . Anginal pain ([place])   . Anxiety   . Arthritis   . Asthma   . Broken wrist   . Bronchitis   . chronic diastolic CHF 05/15/2352  . Chronic kidney disease   . COPD (chronic obstructive pulmonary disease) ([place])   . Coronary artery disease    a. cath 2013: stenting to RCA (report not available); b. cath 2014: LM nl, pLAD 40%, mLAD nl, ost LCx 40%, mid LCx nl, pRCA 30% @ site of prior stent, mRCA 50%  . Depression   . Diabetes mellitus ([place])   . Diabetes mellitus without complication ([place])   . Diabetic neuropathy ([place])   . dialysis 2006  . Diverticulosis   . Dizziness   . Dyspnea   . Elevated lipids   . Environmental and seasonal allergies   . ESRD (end stage renal disease) on dialysis ([place])    M-W-F  . Gastroparesis   . GERD (gastroesophageal reflux disease)   . Headache   . History of anemia due to chronic kidney disease   . History of hiatal hernia   . HOH (hard of hearing)   . Hx of pancreatitis 2015  . Hypertension   . Lower extremity  edema   . Mitral regurgitation    a. echo 10/2013: EF 62%, noWMA, mildly dilated LA, mild to mod MR/TR, GR1DD  . Myocardial infarction ([place])   . Orthopnea   . Parathyroid abnormality ([place])   . Peripheral arterial disease ([place])   . Pneumonia   . Renal cancer ([place])   . Renal insufficiency    Pt is on dialysis on M,W + F.  . Wheezing     Past Surgical History:  Procedure Laterality Date  . A/V SHUNTOGRAM Left 01/20/2018   Procedure: A/V SHUNTOGRAM;  Surgeon: Algernon Huxley, MD;  Location: [place] [facility];  Service: Cardiovascular;  Laterality: Left;  . ABDOMINAL HYSTERECTOMY  1992  . AMPUTATION TOE Left 10/02/2017   Procedure: AMPUTATION TOE-LEFT GREAT TOE;  Surgeon: Albertine Patricia, DPM;  Location: [facility];  Service: Podiatry;  Laterality: Left;  . APPENDECTOMY    . APPLICATION OF WOUND VAC N/A 11/25/2019   Procedure: APPLICATION OF WOUND VAC;  Surgeon: Katha Cabal, MD;  Location: [facility];  Service: Vascular;  Laterality: N/A;  . ARTERY BIOPSY Right 10/11/2018   Procedure: BIOPSY TEMPORAL ARTERY;  Surgeon: Vickie Epley, MD;  Location: [facility];  Service: General;  Laterality: Right;  . CARDIAC CATHETERIZATION Left 07/26/2015  Procedure: Left Heart Cath and Coronary Angiography;  Surgeon: Dionisio David, MD;  Location: [place] [facility];  Service: Cardiovascular;  Laterality: Left;  . CATARACT EXTRACTION W/ INTRAOCULAR LENS IMPLANT Right   . CATARACT EXTRACTION W/PHACO Left 03/10/2017   Procedure: CATARACT EXTRACTION PHACO AND INTRAOCULAR LENS PLACEMENT (IOC);  Surgeon: Birder Robson, MD;  Location: [facility];  Service: Ophthalmology;  Laterality: Left;  [place] 00:51.9 AP% 14.2 CDE 7.39 fluid pack lot # [id number]  . CENTRAL LINE INSERTION Right 11/11/2019   Procedure: CENTRAL LINE INSERTION;  Surgeon: Katha Cabal, MD;  Location: [facility];  Service: Vascular;  Laterality: Right;  . CENTRAL LINE INSERTION  11/25/2019   Procedure: CENTRAL LINE INSERTION;  Surgeon:  Katha Cabal, MD;  Location: [facility];  Service: Vascular;;  . CHOLECYSTECTOMY    . COLONOSCOPY WITH PROPOFOL N/A 08/12/2016   Procedure: COLONOSCOPY WITH PROPOFOL;  Surgeon: Lollie Sails, MD;  Location: [facility];  Service: Endoscopy;  Laterality: N/A;  . DIALYSIS FISTULA CREATION Left    upper arm  . dialysis grafts    . DIALYSIS/PERMA CATHETER INSERTION N/A 11/14/2019   Procedure: DIALYSIS/PERMA CATHETER INSERTION;  Surgeon: Algernon Huxley, MD;  Location: [place] [facility];  Service: Cardiovascular;  Laterality: N/A;  . DIALYSIS/PERMA CATHETER INSERTION N/A 02/03/2020   Procedure: DIALYSIS/PERMA CATHETER INSERTION;  Surgeon: Katha Cabal, MD;  Location: [place] [facility];  Service: Cardiovascular;  Laterality: N/A;  . DIALYSIS/PERMA CATHETER INSERTION N/A 06/19/2020   Procedure: DIALYSIS/PERMA CATHETER INSERTION;  Surgeon: Katha Cabal, MD;  Location: [place] [facility];  Service: Cardiovascular;  Laterality: N/A;  . DIALYSIS/PERMA CATHETER REMOVAL N/A 05/25/2020   Procedure: DIALYSIS/PERMA CATHETER REMOVAL;  Surgeon: Katha Cabal, MD;  Location: [place] [facility];  Service: Cardiovascular;  Laterality: N/A;  . ESOPHAGOGASTRODUODENOSCOPY N/A 03/08/2015   Procedure: ESOPHAGOGASTRODUODENOSCOPY (EGD);  Surgeon: Manya Silvas, MD;  Location: [facility];  Service: Endoscopy;  Laterality: N/A;  . ESOPHAGOGASTRODUODENOSCOPY (EGD) WITH PROPOFOL N/A 03/18/2016   Procedure: ESOPHAGOGASTRODUODENOSCOPY (EGD) WITH PROPOFOL;  Surgeon: [name], MD;  Location: [facility];  Service: Endoscopy;  Laterality: N/A;  . EYE SURGERY Right 2018  . FECAL TRANSPLANT N/A 08/23/2015   Procedure: FECAL TRANSPLANT;  Surgeon: Manya Silvas, MD;  Location: [facility] ENDOSCOPY;  Service: Endoscopy;  Laterality: N/A;  . HAND SURGERY Bilateral   . HEMATOMA EVACUATION Left 11/25/2019   Procedure: EVACUATION HEMATOMA;  Surgeon: Katha Cabal, MD;  Location: [facility];  Service:  Vascular;  Laterality: Left;  . I & D EXTREMITY Left 11/25/2019   Procedure: IRRIGATION AND DEBRIDEMENT EXTREMITY;  Surgeon: Katha Cabal, MD;  Location: [facility];  Service: Vascular;  Laterality: Left;  . IR FLUORO GUIDE CV LINE RIGHT  04/06/2020  . IR INJECT/THERA/INC NEEDLE/CATH/PLC EPI/CERV/THOR [facility]  08/13/2020  . IR RADIOLOGIST EVAL & MGMT  07/28/2019  . IR RADIOLOGIST EVAL & MGMT  08/11/2019  . LIGATION OF ARTERIOVENOUS  FISTULA Left 11/11/2019  . LIGATION OF ARTERIOVENOUS  FISTULA Left 11/11/2019   Procedure: LIGATION OF ARTERIOVENOUS  FISTULA;  Surgeon: Katha Cabal, MD;  Location: [facility];  Service: Vascular;  Laterality: Left;  . LIGATIONS OF HERO GRAFT Right 06/13/2020   Procedure: LIGATION / REMOVAL OF RIGHT HERO GRAFT;  Surgeon: Katha Cabal, MD;  Location: [facility];  Service: Vascular;  Laterality: Right;  . PERIPHERAL VASCULAR CATHETERIZATION N/A 12/20/2015   Procedure: Thrombectomy of dialysis access versus permcath placement;  Surgeon: Algernon Huxley,  MD;  Location: [place] [facility];  Service: Cardiovascular;  Laterality: N/A;  . PERIPHERAL VASCULAR CATHETERIZATION N/A 12/20/2015   Procedure: A/V Shunt Intervention;  Surgeon: Algernon Huxley, MD;  Location: [place] [facility];  Service: Cardiovascular;  Laterality: N/A;  . PERIPHERAL VASCULAR CATHETERIZATION N/A 12/20/2015   Procedure: A/V Shuntogram/Fistulagram;  Surgeon: Algernon Huxley, MD;  Location: [place] [facility];  Service: Cardiovascular;  Laterality: N/A;  . PERIPHERAL VASCULAR CATHETERIZATION N/A 01/02/2016   Procedure: A/V Shuntogram/Fistulagram;  Surgeon: Algernon Huxley, MD;  Location: [place] [facility];  Service: Cardiovascular;  Laterality: N/A;  . PERIPHERAL VASCULAR CATHETERIZATION N/A 01/02/2016   Procedure: A/V Shunt Intervention;  Surgeon: Algernon Huxley, MD;  Location: [place] [facility];  Service: Cardiovascular;  Laterality: N/A;  . TEE WITHOUT CARDIOVERSION N/A 06/11/2020   Procedure:  TRANSESOPHAGEAL ECHOCARDIOGRAM (TEE);  Surgeon: Teodoro Spray, MD;  Location: [facility];  Service: Cardiovascular;  Laterality: N/A;  . UPPER EXTREMITY ANGIOGRAPHY Bilateral 11/27/2020   Procedure: UPPER EXTREMITY ANGIOGRAPHY;  Surgeon: Katha Cabal, MD;  Location: [place] [facility];  Service: Cardiovascular;  Laterality: Bilateral;  . UPPER EXTREMITY VENOGRAPHY Right 01/18/2020   Procedure: UPPER EXTREMITY VENOGRAPHY;  Surgeon: Katha Cabal, MD;  Location: [place] [facility];  Service: Cardiovascular;  Laterality: Right;  . UPPER EXTREMITY VENOGRAPHY Bilateral 11/27/2020   Procedure: UPPER EXTREMITY VENOGRAPHY;  Surgeon: Katha Cabal, MD;  Location: [place] [facility];  Service: Cardiovascular;  Laterality: Bilateral;  . VASCULAR ACCESS DEVICE INSERTION Right 04/13/2020   Procedure: INSERTION OF HERO VASCULAR ACCESS DEVICE (GRAFT);  Surgeon: Katha Cabal, MD;  Location: [facility];  Service: Vascular;  Laterality: Right;    Social History Social History   Tobacco Use  . Smoking status: Never Smoker  . Smokeless tobacco: Never Used  Vaping Use  . Vaping Use: Never used  Substance Use Topics  . Alcohol use: Not Currently    Comment: glass wine week per pt  . Drug use: Yes    Types: Marijuana    Comment: once a day    Family History Family History  Problem Relation Age of Onset  . Kidney disease Mother   . Diabetes Mother   . Cancer Father   . Kidney disease Sister     Allergies  Allergen Reactions  . Ace Inhibitors Swelling and Anaphylaxis  . Ativan [Lorazepam] Other (See Comments)    Reaction:Hallucinations and headaches  . Compazine [Prochlorperazine Edisylate] Anaphylaxis, Nausea And Vomiting and Other (See Comments)    Other reaction(s): dystonia from this vs. Reglan, 23 Jul - patient relates that she takes promethazine frequently with no problems  . Sumatriptan Succinate Other (See Comments)    Other reaction(s): delirium and hallucinations  per [facility] records  . Zofran [Ondansetron] Nausea And Vomiting    Per pt. she is allergic to zofran or will experience adverse reaction like hallucination   . Losartan Nausea Only  . Prochlorperazine Other (See Comments)    Reaction:  Unknown . Patient does not remember reaction but she does have vertigo and anxiety along with n and v at times. Could be used to treat any of these   . Reglan [Metoclopramide] Other (See Comments)    Per patient her Dr. Evelina Bucy her off it   . Scopolamine Other (See Comments)    Dizziness, also has vertigo already  . Tape Rash    Plastic tape causes rash  . Tapentadol Rash  . Ultrasound Gel  Itching    Patient states the Ultrasound gel caused itching while in skin contact and resolved when wiped away.     REVIEW OF SYSTEMS (Negative unless checked)  Constitutional: [] Weight loss  [] Fever  [] Chills Cardiac: [] Chest pain   [] Chest pressure   [] Palpitations   [] Shortness of breath when laying flat   [] Shortness of breath with exertion. Vascular:  [] Pain in legs with walking   [] Pain in legs at rest  [] History of DVT   [] Phlebitis   [] Swelling in legs   [] Varicose veins   [] Non-healing ulcers Pulmonary:   [] Uses home oxygen   [] Productive cough   [] Hemoptysis   [] Wheeze  [] COPD   [] Asthma Neurologic:  [] Dizziness   [] Seizures   [] History of stroke   [] History of TIA  [] Aphasia   [] Vissual changes   [] Weakness or numbness in arm   [] Weakness or numbness in leg Musculoskeletal:   [] Joint swelling   [] Joint pain   [] Low back pain Hematologic:  [] Easy bruising  [] Easy bleeding   [] Hypercoagulable state   [] Anemic Gastrointestinal:  [] Diarrhea   [] Vomiting  [] Gastroesophageal reflux/heartburn   [] Difficulty swallowing. Genitourinary:  [x] Chronic kidney disease   [] Difficult urination  [] Frequent urination   [] Blood in urine Skin:  [] Rashes   [] Ulcers  Psychological:  [] History of anxiety   []  History of major depression.  Physical Examination  There were no vitals  filed for this visit. There is no height or weight on file to calculate BMI. Gen: WD/WN, NAD Head: [place]/AT, No temporalis wasting.  Ear/Nose/Throat: Hearing grossly intact, nares w/o erythema or drainage Eyes: PER, EOMI, sclera nonicteric.  Neck: Supple, no large masses.   Pulmonary:  Good air movement, no audible wheezing bilaterally, no use of accessory muscles.  Cardiac: RRR, no JVD Vascular:  Left IJ tunneled catheter Vessel Right Left  Radial Palpable Palpable  Brachial Palpable Palpable  Carotid Palpable Palpable  Gastrointestinal: Non-distended. No guarding/no peritoneal signs.  Musculoskeletal: M/S 5/5 throughout.  No deformity or atrophy.  Neurologic: CN 2-12 intact. Symmetrical.  Speech is fluent. Motor exam as listed above. Psychiatric: Judgment intact, Mood & affect appropriate for pt's clinical situation. Dermatologic: No rashes or ulcers noted.  No changes consistent with cellulitis. Lymph : + lichenification or skin changes of chronic lymphedema.  CBC Lab Results  Component Value Date   WBC 5.8 12/14/2020   HGB 8.3 (L) 12/14/2020   HCT 24.6 (L) 12/14/2020   MCV 82.8 12/14/2020   PLT 115 (L) 12/14/2020    BMET    Component Value Date/Time   NA 138 12/14/2020 1100   NA 135 (L) 09/15/2014 0948   K 4.4 12/14/2020 1100   K 4.8 09/15/2014 0948   CL 88 (L) 12/14/2020 1100   CL 99 09/15/2014 0948   CO2 38 (H) 12/14/2020 1100   CO2 26 09/15/2014 0948   GLUCOSE 92 12/14/2020 1100   GLUCOSE 118 (H) 09/15/2014 0948   BUN 18 12/14/2020 1100   BUN 19 (H) 09/15/2014 0948   CREATININE 3.96 (H) 12/14/2020 1100   CREATININE 6.79 (H) 09/15/2014 0948   CALCIUM 8.4 (L) 12/14/2020 1100   CALCIUM 8.3 (L) 09/15/2014 0948   GFRNONAA 12 (L) 12/14/2020 1100   GFRNONAA 7 (L) 09/15/2014 0948   GFRNONAA 6 (L) 05/31/2014 0432   GFRAA 11 (L) 06/25/2020 0641   GFRAA 8 (L) 09/15/2014 0948   GFRAA 7 (L) 05/31/2014 0432   Estimated Creatinine Clearance: 15.9 mL/min (A) (by C-G  formula based on SCr of  3.96 mg/dL (H)).  COAG Lab Results  Component Value Date   INR 1.2 09/17/2020   INR 1.3 (H) 06/13/2020   INR 1.0 04/13/2020    Radiology DG Chest 2 View  Result Date: 11/21/2020 CLINICAL DATA:  Weakness, shortness of breath EXAM: CHEST - 2 VIEW COMPARISON:  09/17/2020 FINDINGS: Left dialysis catheter remains in place, unchanged. Cardiomegaly. Small bilateral pleural effusions. Diffuse interstitial prominence throughout the lungs. Bibasilar airspace opacities. Findings are similar to prior study. No acute bony abnormality. IMPRESSION: Diffuse interstitial prominence and bibasilar opacities could reflect edema. Small bilateral pleural effusions. Electronically Signed   By: Rolm Baptise M.D.   On: 11/21/2020 11:13   PERIPHERAL VASCULAR CATHETERIZATION  Result Date: 11/27/2020 See Op Note  DG Chest Portable 1 View  Result Date: 12/04/2020 CLINICAL DATA:  Shortness of breath. EXAM: PORTABLE CHEST 1 VIEW COMPARISON:  11/21/2020 FINDINGS: 1642 hours. The cardio pericardial silhouette is enlarged. Vascular congestion diffuse interstitial opacity is compatible with edema. Probable airspace edema in the lower lungs with small bilateral pleural effusions. Left IJ dialysis catheter tip is in the lower right atrium. Subclavian vascular stent device evident. IMPRESSION: Cardiomegaly with pulmonary edema and small bilateral pleural effusions. Electronically Signed   By: Misty Stanley M.D.   On: 12/04/2020 16:53   [place] ASCITES (ABDOMEN LIMITED)  Result Date: 12/04/2020 CLINICAL DATA:  Ascites EXAM: LIMITED ABDOMEN ULTRASOUND FOR ASCITES TECHNIQUE: Limited ultrasound survey for ascites was performed in all four abdominal quadrants. COMPARISON:  10/29/2020 FINDINGS: Sonographic evaluation of the 4 quadrants of the abdomen are obtained. There is trace ascites within the bilateral upper quadrant surrounding the spleen and liver. Insufficient volume for paracentesis. IMPRESSION: 1. Trace  ascites within the bilateral upper quadrants. Volume is insufficient for paracentesis. Electronically Signed   By: Randa Ngo M.D.   On: 12/04/2020 22:29     Assessment/Plan There are no diagnoses linked to this encounter.   Hortencia Pilar, MD  12/14/2020 3:58 PM

## 2020-12-15 DIAGNOSIS — F4323 Adjustment disorder with mixed anxiety and depressed mood: Secondary | ICD-10-CM | POA: Diagnosis not present

## 2020-12-15 LAB — TYPE AND SCREEN
ABO/RH(D): O POS
Antibody Screen: NEGATIVE
Unit division: 0

## 2020-12-15 LAB — BPAM RBC
Blood Product Expiration Date: 202204262359
ISSUE DATE / TIME: 202203241316
Unit Type and Rh: 5100

## 2020-12-15 LAB — GLUCOSE, CAPILLARY
Glucose-Capillary: 102 mg/dL — ABNORMAL HIGH (ref 70–99)
Glucose-Capillary: 113 mg/dL — ABNORMAL HIGH (ref 70–99)
Glucose-Capillary: 50 mg/dL — ABNORMAL LOW (ref 70–99)
Glucose-Capillary: 57 mg/dL — ABNORMAL LOW (ref 70–99)
Glucose-Capillary: 76 mg/dL (ref 70–99)
Glucose-Capillary: 88 mg/dL (ref 70–99)

## 2020-12-15 MED ORDER — METOCLOPRAMIDE HCL 5 MG/ML IJ SOLN
5.0000 mg | Freq: Once | INTRAMUSCULAR | Status: AC
  Administered 2020-12-15: 5 mg via INTRAVENOUS
  Filled 2020-12-15 (×2): qty 2

## 2020-12-15 NOTE — TOC Progression Note (Addendum)
Transition of Care [facility]) - Progression Note    Patient Details  Name: [name] MRN: [id number] Date of Birth: 1961/06/11  Transition of Care [facility] CM/SW Contact  Elliot Gurney [place], [place] Phone Number: [phone] 12/15/2020, 10:06 AM  Clinical Narrative:    Phone call to Magda Paganini, intake coordinator for [facility]. Voicemail left regarding authorization for SNF admission.  10:50am Return call from [place] at [facility]. Additional clinical faxed in to insurance company for authorization. Authorization should be received by Monday.   [place], LCSW Transitions of Care [phone]    Expected Discharge Plan: Skilled Nursing Facility Barriers to Discharge: Continued Medical Work up  Expected Discharge Plan and Services Expected Discharge Plan: [place] In-house Referral: Clinical Social Work   Post Acute Care Choice: [place] Living arrangements for the past 2 months: [place] Determinants of Health (SDOH) Interventions    Readmission Risk Interventions Readmission Risk Prevention Plan 12/11/2020 09/21/2020 08/14/2020  Transportation Screening Complete Complete -  Medication Review Press photographer) Complete Complete -  PCP or Specialist appointment within 3-5 days of discharge Complete Complete Complete  HRI or Home Care Consult Complete Complete Complete  SW Recovery Care/Counseling Consult Complete Complete Complete  Palliative Care Screening Not Applicable Complete Complete  [place] Not Complete Complete Not Applicable  SNF Comments working on placement - -  Some recent data might be hidden

## 2020-12-15 NOTE — Progress Notes (Signed)
[facility], [place] 12/15/20  Subjective:   LOS: 10    Patient seen today on second floor Patient resting comfortably Patient voices no new physical concerns.     Objective:  Vital signs in last 24 hours:  Temp:  [97.5 F (36.4 C)-97.6 F (36.4 C)] 97.6 F (36.4 C) (03/26 0541) Pulse Rate:  [54-65] 64 (03/26 0541) Resp:  [0-16] 16 (03/26 0541) BP: (124-154)/(54-81) 133/57 (03/26 0541) SpO2:  [91 %-100 %] 100 % (03/26 0541)  Weight change:  Filed Weights   12/09/20 0247 12/11/20 1300 12/12/20 1149  Weight: 83.5 kg 88 kg 83.5 kg    Intake/Output:    Intake/Output Summary (Last 24 hours) at 12/15/2020 0834 Last data filed at 12/14/2020 1410 Gross per 24 hour  Intake --  Output 2000 ml  Net -2000 ml     Physical Exam: General:  No acute distress  HEENT  moist oral mucous membranes  Pulm/lungs  Lungs clear, respiration symmetrical,unlabored  CVS/Heart  S1S2,no rubs or gallops  Abdomen:   Soft,non distended  Extremities:  1+ peripheral edema  Neurologic:  Alert, oriented able to answer questions  Skin:  No acute lesions or rashes  Access:  Left IJ PermCath       Basic Metabolic Panel:  Recent Labs  Lab 12/11/20 1557 12/13/20 1055 12/14/20 1100  NA 140  --  138  K 3.8  --  4.4  CL 91*  --  88*  CO2 40*  --  38*  GLUCOSE 93  --  92  BUN 17  --  18  CREATININE 3.92*  --  3.96*  CALCIUM 8.3*  --  8.4*  PHOS 2.3* 2.5  --      CBC: Recent Labs  Lab 12/11/20 1557 12/14/20 1100  WBC 3.1* 5.8  HGB 7.3* 8.3*  HCT 22.8* 24.6*  MCV 84.8 82.8  PLT 104* 115*      Lab Results  Component Value Date   HEPBSAG NON REACTIVE 12/06/2020   HEPBIGM  06/12/2010    NEGATIVE (NOTE) High levels of Hepatitis B Core IgM antibody are detectable during the acute stage of Hepatitis B. This antibody is used to differentiate current from past HBV infection.       Microbiology:  Recent Results (from the past 240 hour(s))  MRSA PCR  Screening     Status: None   Collection Time: 12/05/20 10:32 AM   Specimen: Nasopharyngeal  Result Value Ref Range Status   MRSA by PCR NEGATIVE NEGATIVE Final    Comment:        The GeneXpert MRSA Assay (FDA approved for NASAL specimens only), is one component of a comprehensive MRSA colonization surveillance program. It is not intended to diagnose MRSA infection nor to guide or monitor treatment for MRSA infections. Performed at [facility], [place], [place] [address]   SARS CORONAVIRUS 2 (TAT 6-24 HRS) Nasopharyngeal Nasopharyngeal Swab     Status: None   Collection Time: 12/12/20 11:15 AM   Specimen: Nasopharyngeal Swab  Result Value Ref Range Status   SARS Coronavirus 2 NEGATIVE NEGATIVE Final    Comment: (NOTE) SARS-CoV-2 target nucleic acids are NOT DETECTED.  The SARS-CoV-2 RNA is generally detectable in upper and lower respiratory specimens during the acute phase of infection. Negative results do not preclude SARS-CoV-2 infection, do not rule out co-infections with other pathogens, and should not be used as the sole basis for treatment or other patient management decisions. Negative results  must be combined with clinical observations, patient history, and epidemiological information. The expected result is Negative.  Fact Sheet for Patients: SugarRoll.be  Fact Sheet for Healthcare Providers: https://www.woods-mathews.com/  This test is not yet approved or cleared by the [place] FDA and  has been authorized for detection and/or diagnosis of SARS-CoV-2 by FDA under an Emergency Use Authorization (EUA). This EUA will remain  in effect (meaning this test can be used) for the duration of the COVID-19 declaration under Se ction 564(b)(1) of the Act, 21 U.S.C. section 360bbb-3(b)(1), unless the authorization is terminated or revoked sooner.  Performed at [facility], [place] [address]  [place], [place] [address]     Coagulation Studies: No results for input(s): LABPROT, INR in the last 72 hours.  Urinalysis: No results for input(s): COLORURINE, LABSPEC, PHURINE, GLUCOSEU, HGBUR, BILIRUBINUR, KETONESUR, PROTEINUR, UROBILINOGEN, NITRITE, LEUKOCYTESUR in the last 72 hours.  Invalid input(s): APPERANCEUR    Imaging: No results found.   Medications:   . albumin human Stopped (12/13/20 1326)   . amLODipine  10 mg Oral Daily  . atorvastatin  20 mg Oral QPM  . Chlorhexidine Gluconate Cloth  6 each Topical Daily  . epoetin (EPOGEN/PROCRIT) injection  10,000 Units Intravenous Q T,Th,Sa-HD  . feeding supplement (NEPRO CARB STEADY)  237 mL Oral BID BM  . furosemide  80 mg Intravenous Daily  . gabapentin  100 mg Oral QPM  . heparin  5,000 Units Subcutaneous Q8H  . hydrALAZINE  50 mg Oral Q8H  . hydrOXYzine  25 mg Oral Daily  . insulin aspart  0-5 Units Subcutaneous QHS  . insulin aspart  0-9 Units Subcutaneous TID WC  . lipase/protease/amylase  36,000 Units Oral TID AC  . metoCLOPramide (REGLAN) injection  5 mg Intravenous Once  . multivitamin  1 tablet Oral QHS  . naphazoline-glycerin  1-2 drop Both Eyes BID  . pantoprazole  40 mg Oral Daily  . sevelamer carbonate  800 mg Oral TID WC  . cyanocobalamin  1,000 mcg Oral Daily   calcium carbonate, diphenhydrAMINE, HYDROcodone-acetaminophen, meclizine, sevelamer carbonate  Assessment/ Plan:  60 y.o. female with end-stage renal disease, on chronic diastolic CHF, diabetes type 2, thoracic discitis T7-8 completed treatment jan 2022 was admitted on 12/04/2020 for  Principal Problem:   Adjustment disorder with mixed anxiety and depressed mood Active Problems:   End-stage renal disease on hemodialysis (HCC)   HTN (hypertension)   Hypoglycemia associated with diabetes ([place])   Hypokalemia   Sinus bradycardia   Severe anemia   Anemia in ESRD (end-stage renal disease) (HCC)   Fluid overload   Chronic diastolic CHF  (congestive heart failure) (HCC)  Chronic pulmonary edema [J81.1] Anasarca [R60.1] ESRD (end stage renal disease) ([place]) [N18.6] Fluid overload [E87.70] Ascites [R18.8] Severe anemia [D64.9]  [facility] dialysis/TTS 1/EDW 83.5 kg/left IJ PermCath/  #. ESRD with anasarca  Patient is on hemodialysis Patient is on Tuesday Thursday Saturday schedule as outpatient Patient will be dialyzed today Scheduled for dialysis today To help with discharge planning Case management may need to address outpatient transportation .    #. Anemia of CKD  Lab Results  Component Value Date   HGB 8.3 (L) 12/14/2020  Patient hemoglobin is not at goal Patient ESA dosing has recently been increased Patient is on EPO 10000 units with dialysis    #. Secondary hyperparathyroidism of renal origin N 25.81      Component Value Date/Time   PTH 357 (H) 10/05/2018 1241  Intact PTH  is at goal Lab Results  Component Value Date   PHOS 2.5 12/13/2020  Phosphorus is at goal We will continue patient's binders   #. Diabetes type 2 with CKD Hemoglobin A1C (%)  Date Value  07/19/2013 9.3 (H)   Hgb A1c MFr Bld (%)  Date Value  12/07/2020 4.6 (L)   Patient is being closely followed by the primary care for her diabetes  #Acute hypoxic respiratory failure Patient is now clinically better   #Hypertension Patient blood pressure is at goal  Plan  We will dialyze patient today We will keep patient on Epogen    LOS: [place] [facility] 3/26/20228:34 AM  [place], [place]

## 2020-12-15 NOTE — Progress Notes (Signed)
PROGRESS NOTE    [name]  SWF:[id number] DOB: 1961/05/04 DOA: 12/04/2020 PCP: Denton Lank, MD   Brief Narrative: Taken from prior notes. [name] a 60 y.o.femalewith medical history significant forESRD on HD Tu/Thurs/Sat, DM, Chronic diastolic HF, CAD, PVD, recent T7-T8 discitis/osteomyelitis tx with 6 week course of IV vancomycin and ceftazidime ending on 10/10/20, atherosclerotic occlusive disease b/l UE s/p right brachial artery angioplasty on 11/27/20 who presents with concerns of worsening abdominal and LE edema and pain. Remained volume up, although improving.  Volume is being managed with dialysis. Patient now has a bed at [facility] authorization.  Covid test remain negative.  Unable to tolerate dialysis on Thursday secondary to worsening hypotension.  Able to get full session on Friday.  Will resume her normal from today.  Patient received 1 unit of PRBC with improvement in hemoglobin.  Subjective: Patient has no new complaint today.  She was asking about getting dialysis today as today is her normal day.  Waiting for insurance authorization.  Assessment & Plan:   Principal Problem:   Adjustment disorder with mixed anxiety and depressed mood Active Problems:   End-stage renal disease on hemodialysis (HCC)   HTN (hypertension)   Hypoglycemia associated with diabetes (HCC)   Hypokalemia   Sinus bradycardia   Severe anemia   Anemia in ESRD (end-stage renal disease) (HCC)   Fluid overload   Chronic diastolic CHF (congestive heart failure) (HCC)  Acute hypoxic respiratory failure secondary to pulmonary edema.  Resolved.  Now saturating well on room air -Volume is mostly being controlled with dialysis.  Anasarca/chronic diastolic heart failure. -Continue with dialysis to control extra volume, improving but still appears up from her baseline. -Continue with IV Lasix- Will change to p.o. on discharge -Unable to tolerate dialysis yesterday  secondary to hypotension, they will attempt dialyzing her again today.  Generalized weakness/multiple admissions.  Patient has more than 4 admissions in the past 76-month per chart review. Palliative care was consulted-wants to continue with dialysis and will continue with palliative care as an outpatient. -PT recommending SNF placement -TOC consult-patient has a bed offer from [facility] now, pending insurance authorization-now most likely will occur on Monday. -Overall poor prognosis.  Hypoglycemia with history of type 2 diabetes.  Secondary to poor p.o. intake.  A1c of 4.6. CBG within goal today. -Continue to monitor -Encourage p.o. intake  Hypertension.  Blood pressure within goal. -Continue home dose of amlodipine and hydralazine -Continue with IV Lasix  Anemia of chronic kidney disease.  S/p 1 unit of PRBC. Hemoglobin Dropped to 7.2. -Ordered 1 more unit of PRBC. -Continue with Epogen -Continue with iron supplement -Continue to monitor  Asymptomatic bradycardia. -Keep holding Coreg.  History of chronic depression/mood disorder.  Not on any medication at this time.  She was seen by Dr. Weber Cooks.  Stage I obesity. Estimated body mass index is 30.62 kg/m as calculated from the following:   Height as of this encounter: 5\' 5"  (1.651 m).   Weight as of this encounter: 83.5 kg.  Objective: Vitals:   12/14/20 2244 12/15/20 0541 12/15/20 0928 12/15/20 1143  BP: (!) 124/54 (!) 133/57 (!) 153/67 (!) 142/70  Pulse: 65 64 61 61  Resp: 12 16 18 14   Temp: (!) 97.5 F (36.4 C) 97.6 F (36.4 C) 97.6 F (36.4 C) 97.6 F (36.4 C)  TempSrc: Oral     SpO2: 91% 100% 100% 100%  Weight:      Height:  Intake/Output Summary (Last 24 hours) at 12/15/2020 1312 Last data filed at 12/14/2020 1410 Gross per 24 hour  Intake --  Output 2000 ml  Net -2000 ml   Filed Weights   12/09/20 0247 12/11/20 1300 12/12/20 1149  Weight: 83.5 kg 88 kg 83.5 kg     Examination:  General.  Well-developed lady, in no acute distress. Pulmonary.  Lungs clear bilaterally, normal respiratory effort. CV.  Regular rate and rhythm, no JVD, rub or murmur. Abdomen.  Soft, nontender, nondistended, BS positive. CNS.  Alert and oriented x3.  No focal neurologic deficit. Extremities.  Trace LE edema, no cyanosis, pulses intact and symmetrical. Psychiatry.  Judgment and insight appears normal.  DVT prophylaxis: Heparin Code Status: DNR Family Communication: Discussed with patient.   Disposition Plan:  Status is: Inpatient  Remains inpatient appropriate because:Inpatient level of care appropriate due to severity of illness   Dispo: The patient is from: Home              Anticipated d/c is to: SNF              Patient currently is not medically stable to d/c.   Difficult to place patient No              Level of care: Med-Surg  All the records are reviewed and case discussed with Care Management/Social Worker. Management plans discussed with the patient, nursing and they are in agreement.  Consultants:   Nephrology  Procedures:  Antimicrobials:   Data Reviewed: I have personally reviewed following labs and imaging studies  CBC: Recent Labs  Lab 12/11/20 1557 12/14/20 1100  WBC 3.1* 5.8  HGB 7.3* 8.3*  HCT 22.8* 24.6*  MCV 84.8 82.8  PLT 104* 789*   Basic Metabolic Panel: Recent Labs  Lab 12/11/20 1557 12/13/20 1055 12/14/20 1100  NA 140  --  138  K 3.8  --  4.4  CL 91*  --  88*  CO2 40*  --  38*  GLUCOSE 93  --  92  BUN 17  --  18  CREATININE 3.92*  --  3.96*  CALCIUM 8.3*  --  8.4*  PHOS 2.3* 2.5  --    GFR: Estimated Creatinine Clearance: 15.9 mL/min (A) (by C-G formula based on SCr of 3.96 mg/dL (H)). Liver Function Tests: Recent Labs  Lab 12/11/20 1557  ALBUMIN 2.6*   No results for input(s): LIPASE, AMYLASE in the last 168 hours. No results for input(s): AMMONIA in the last 168 hours. Coagulation Profile: No  results for input(s): INR, PROTIME in the last 168 hours. Cardiac Enzymes: No results for input(s): CKTOTAL, CKMB, CKMBINDEX, TROPONINI in the last 168 hours. BNP (last 3 results) No results for input(s): PROBNP in the last 8760 hours. HbA1C: No results for input(s): HGBA1C in the last 72 hours. CBG: Recent Labs  Lab 12/14/20 1609 12/14/20 2009 12/14/20 2142 12/15/20 0754 12/15/20 1146  GLUCAP 75 68* 96 102* 113*   Lipid Profile: No results for input(s): CHOL, HDL, LDLCALC, TRIG, CHOLHDL, LDLDIRECT in the last 72 hours. Thyroid Function Tests: No results for input(s): TSH, T4TOTAL, FREET4, T3FREE, THYROIDAB in the last 72 hours. Anemia Panel: No results for input(s): VITAMINB12, FOLATE, FERRITIN, TIBC, IRON, RETICCTPCT in the last 72 hours. Sepsis Labs: No results for input(s): PROCALCITON, LATICACIDVEN in the last 168 hours.  Recent Results (from the past 240 hour(s))  SARS CORONAVIRUS 2 (TAT 6-24 HRS) Nasopharyngeal Nasopharyngeal Swab     Status: None  Collection Time: 12/12/20 11:15 AM   Specimen: Nasopharyngeal Swab  Result Value Ref Range Status   SARS Coronavirus 2 NEGATIVE NEGATIVE Final    Comment: (NOTE) SARS-CoV-2 target nucleic acids are NOT DETECTED.  The SARS-CoV-2 RNA is generally detectable in upper and lower respiratory specimens during the acute phase of infection. Negative results do not preclude SARS-CoV-2 infection, do not rule out co-infections with other pathogens, and should not be used as the sole basis for treatment or other patient management decisions. Negative results must be combined with clinical observations, patient history, and epidemiological information. The expected result is Negative.  Fact Sheet for Patients: SugarRoll.be  Fact Sheet for Healthcare Providers: https://www.woods-mathews.com/  This test is not yet approved or cleared by the [place] FDA and  has been authorized for  detection and/or diagnosis of SARS-CoV-2 by FDA under an Emergency Use Authorization (EUA). This EUA will remain  in effect (meaning this test can be used) for the duration of the COVID-19 declaration under Se ction 564(b)(1) of the Act, 21 U.S.C. section 360bbb-3(b)(1), unless the authorization is terminated or revoked sooner.  Performed at [facility], [place] [address] [place], [place] [address]      Radiology Studies: No results found.  Scheduled Meds: . amLODipine  10 mg Oral Daily  . atorvastatin  20 mg Oral QPM  . Chlorhexidine Gluconate Cloth  6 each Topical Daily  . epoetin (EPOGEN/PROCRIT) injection  10,000 Units Intravenous Q T,Th,Sa-HD  . feeding supplement (NEPRO CARB STEADY)  237 mL Oral BID BM  . furosemide  80 mg Intravenous Daily  . gabapentin  100 mg Oral QPM  . heparin  5,000 Units Subcutaneous Q8H  . hydrALAZINE  50 mg Oral Q8H  . hydrOXYzine  25 mg Oral Daily  . insulin aspart  0-5 Units Subcutaneous QHS  . insulin aspart  0-9 Units Subcutaneous TID WC  . lipase/protease/amylase  36,000 Units Oral TID AC  . multivitamin  1 tablet Oral QHS  . naphazoline-glycerin  1-2 drop Both Eyes BID  . pantoprazole  40 mg Oral Daily  . sevelamer carbonate  800 mg Oral TID WC  . cyanocobalamin  1,000 mcg Oral Daily   Continuous Infusions: . albumin human Stopped (12/13/20 1326)     LOS: 10 days   Time spent: 25 minutes. More than 50% of the time was spent in counseling/coordination of care  Lorella Nimrod, MD [facility]  If 7PM-7AM, please contact night-coverage [email or web address]  12/15/2020, 1:12 PM   This record has been created using Systems analyst. Errors have been sought and corrected,but may not always be located. Such creation errors do not reflect on the standard of care.

## 2020-12-15 NOTE — Progress Notes (Signed)
BS 78.  Cup of apple juice given.

## 2020-12-16 DIAGNOSIS — F4323 Adjustment disorder with mixed anxiety and depressed mood: Secondary | ICD-10-CM | POA: Diagnosis not present

## 2020-12-16 LAB — GLUCOSE, CAPILLARY
Glucose-Capillary: 66 mg/dL — ABNORMAL LOW (ref 70–99)
Glucose-Capillary: 108 mg/dL — ABNORMAL HIGH (ref 70–99)

## 2020-12-16 IMAGING — DX DG CHEST 1V PORT
1 series · 1 of 1 positions shown · non-contrast
Comparison: CT the abdomen and pelvis from Saturday May, 2020 and CT
of the chest also from Saturday May, 2020

CLINICAL DATA: Chest pain shortness of breath, history of asthma.

EXAM:
PORTABLE CHEST 1 VIEW

[chest ap]
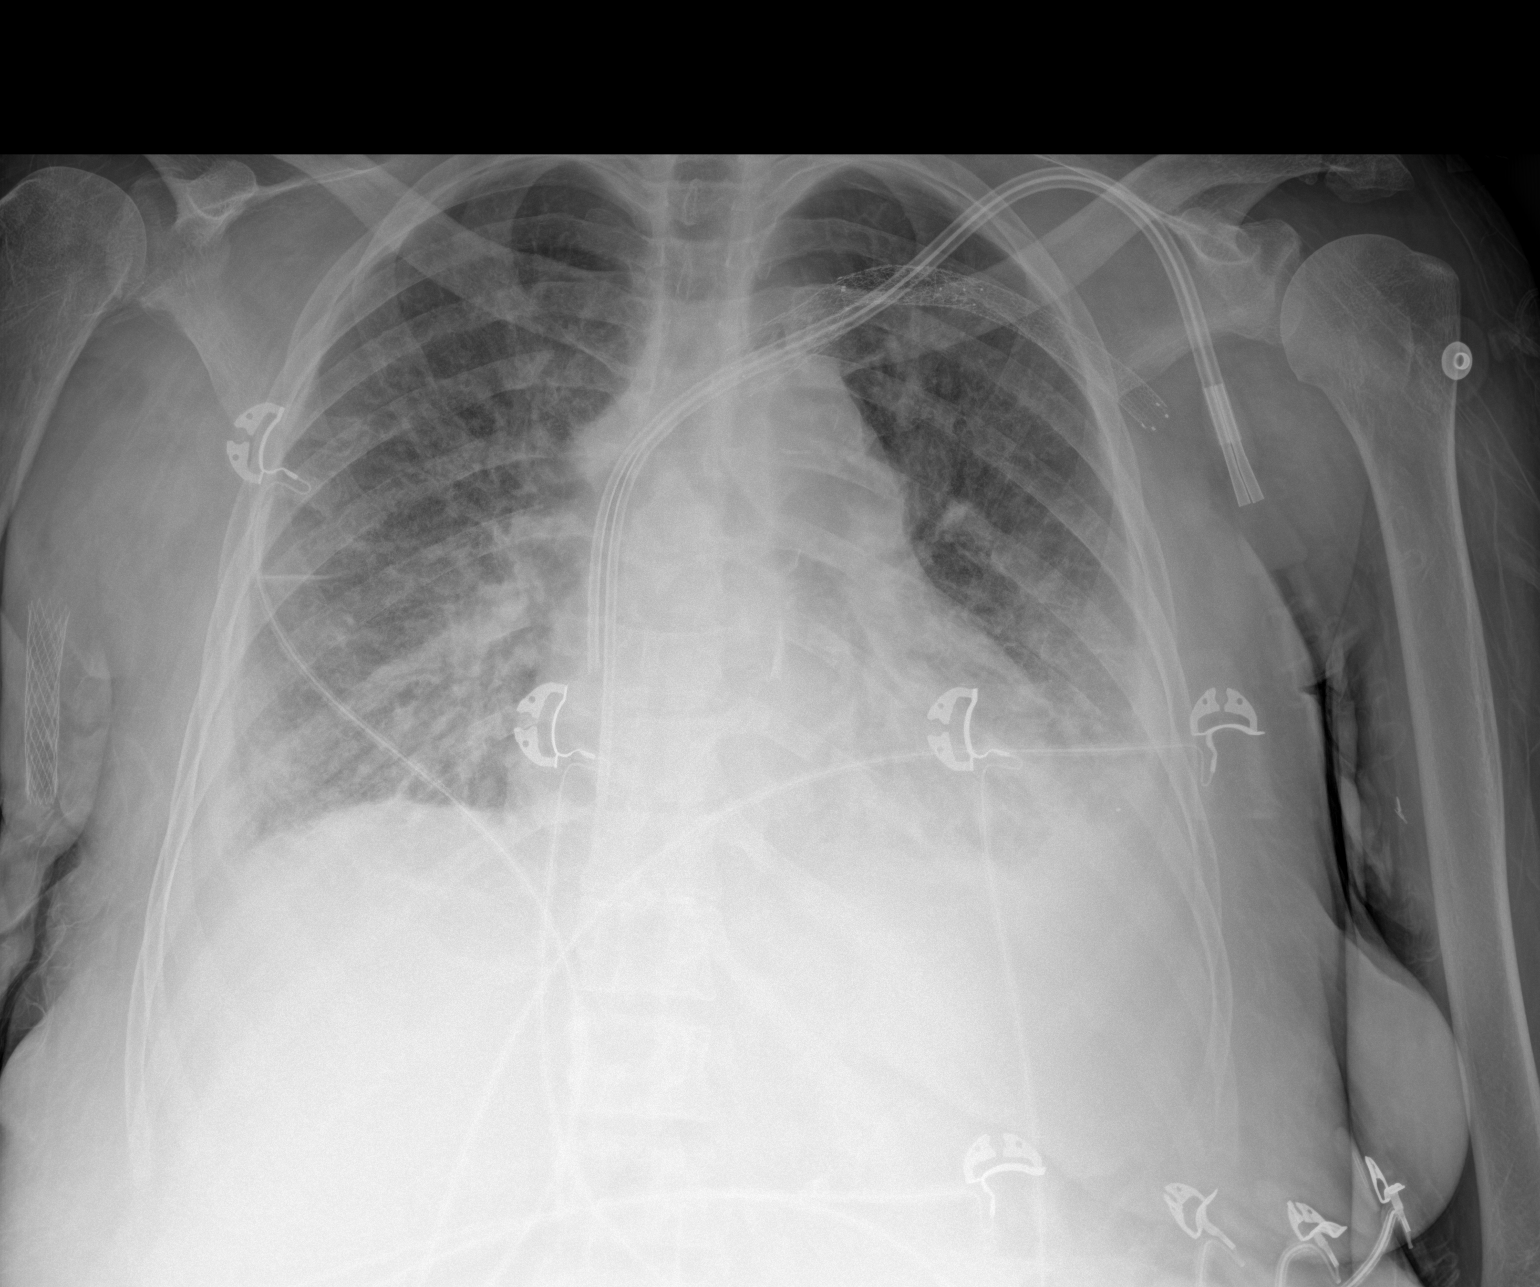

[1 of 1 positions shown; findings below may reference images not displayed]

FINDINGS: Signs of LEFT axillary and subclavian venous stenting similar to the
prior exam. Stenting also in the RIGHT mid arm. Interval removal of
RIGHT-sided central venous access device and placement of a
LEFT-sided dialysis catheter, tip in the mid to low RIGHT atrium
relative to the longest lumen.

Trachea midline, air column normal.

Cardiomediastinal contours with enlargement. Hilar structures with
engorgement of central pulmonary vasculature with increased
interstitial markings bilaterally.

Blunting of LEFT costodiaphragmatic sulcus in patchy basilar
airspace opacities.

On limited assessment no acute skeletal process.
IMPRESSION: 1. Findings consistent with volume overload/CHF. Difficult to
exclude the possibility of concomitant infection particularly in the
LEFT lung base.
2. New LEFT-sided dialysis catheter, tip in the mid to low RIGHT
atrium.

## 2020-12-16 NOTE — Progress Notes (Signed)
Pts blood sugar 50. Orange juice given. Sandwich tray at bedside. Pt encouraged to eat.

## 2020-12-16 NOTE — Progress Notes (Signed)
PROGRESS NOTE    [name]  NAT:[id number] DOB: 1960/11/18 DOA: 12/04/2020 PCP: Denton Lank, MD   Brief Narrative: Taken from prior notes. MILIA WARTH a 60 y.o.femalewith medical history significant forESRD on HD Tu/Thurs/Sat, DM, Chronic diastolic HF, CAD, PVD, recent T7-T8 discitis/osteomyelitis tx with 6 week course of IV vancomycin and ceftazidime ending on 10/10/20, atherosclerotic occlusive disease b/l UE s/p right brachial artery angioplasty on 11/27/20 who presents with concerns of worsening abdominal and LE edema and pain. Remained volume up, although improving.  Volume is being managed with dialysis. Patient now has a bed at [facility] authorization.  Covid test remain negative.  Unable to tolerate dialysis on Thursday secondary to worsening hypotension.  Able to get full session on Friday.  Will resume her normal from today.  Patient received total 2 unit of PRBC with improvement in hemoglobin.  Subjective: Patient states that she seems improving.  She was able to get out of bed and walk around in the room.  She really wants to go home as soon as possible after rehab.  Assessment & Plan:   Principal Problem:   Adjustment disorder with mixed anxiety and depressed mood Active Problems:   End-stage renal disease on hemodialysis (HCC)   HTN (hypertension)   Hypoglycemia associated with diabetes (HCC)   Hypokalemia   Sinus bradycardia   Severe anemia   Anemia in ESRD (end-stage renal disease) (HCC)   Fluid overload   Chronic diastolic CHF (congestive heart failure) (HCC)  Acute hypoxic respiratory failure secondary to pulmonary edema.  Resolved.  Now saturating well on room air -Volume is mostly being controlled with dialysis.  Anasarca/chronic diastolic heart failure. -Continue with dialysis to control extra volume, improving but still appears up from her baseline. -Continue with IV Lasix- Will change to p.o. on discharge -Unable to tolerate  dialysis yesterday secondary to hypotension, they will attempt dialyzing her again today.  Generalized weakness/multiple admissions.  Patient has more than 4 admissions in the past 23-month per chart review. Palliative care was consulted-wants to continue with dialysis and will continue with palliative care as an outpatient. -PT recommending SNF placement -TOC consult-patient has a bed offer from [facility] now, pending insurance authorization-now most likely will occur on Monday. -Overall poor prognosis.  Hypoglycemia with history of type 2 diabetes.  Secondary to poor p.o. intake.  A1c of 4.6. CBG within goal today. -Continue to monitor -Encourage p.o. intake  Hypertension.  Blood pressure within goal. -Continue home dose of amlodipine and hydralazine -Continue with IV Lasix  Anemia of chronic kidney disease.  S/p 2 unit of PRBC. -Continue with Epogen -Continue with iron supplement -Continue to monitor  Asymptomatic bradycardia. -Keep holding Coreg.  History of chronic depression/mood disorder.  Not on any medication at this time.  She was seen by Dr. Weber Cooks.  Stage I obesity. Estimated body mass index is 30.62 kg/m as calculated from the following:   Height as of this encounter: 5\' 5"  (1.651 m).   Weight as of this encounter: 83.5 kg.  Objective: Vitals:   12/15/20 2300 12/15/20 2344 12/16/20 0427 12/16/20 0916  BP:  (!) 155/72 (!) 128/58 (!) 153/72  Pulse:  64 (!) 53 (!) 59  Resp: 17 20 16 16   Temp:  98.1 F (36.7 C) (!) 97.5 F (36.4 C) (!) 97.5 F (36.4 C)  TempSrc:      SpO2: 100% 100% 100% 100%  Weight:      Height:  Intake/Output Summary (Last 24 hours) at 12/16/2020 1513 Last data filed at 12/15/2020 1900 Gross per 24 hour  Intake --  Output 3000 ml  Net -3000 ml   Filed Weights   12/09/20 0247 12/11/20 1300 12/12/20 1149  Weight: 83.5 kg 88 kg 83.5 kg    Examination:  General.  Well-developed lady, in no acute distress. Pulmonary.   Lungs clear bilaterally, normal respiratory effort. CV.  Regular rate and rhythm, no JVD, rub or murmur. Abdomen.  Soft, nontender, nondistended, BS positive. CNS.  Alert and oriented x3.  No focal neurologic deficit. Extremities.  Trace LE edema, no cyanosis, pulses intact and symmetrical. Psychiatry.  Judgment and insight appears normal.  DVT prophylaxis: Heparin Code Status: DNR Family Communication: Discussed with patient.   Disposition Plan:  Status is: Inpatient  Remains inpatient appropriate because:Inpatient level of care appropriate due to severity of illness   Dispo: The patient is from: Home              Anticipated d/c is to: SNF              Patient currently is not medically stable to d/c.   Difficult to place patient No              Level of care: Med-Surg  All the records are reviewed and case discussed with Care Management/Social Worker. Management plans discussed with the patient, nursing and they are in agreement.  Consultants:   Nephrology  Procedures:  Antimicrobials:   Data Reviewed: I have personally reviewed following labs and imaging studies  CBC: Recent Labs  Lab 12/11/20 1557 12/14/20 1100  WBC 3.1* 5.8  HGB 7.3* 8.3*  HCT 22.8* 24.6*  MCV 84.8 82.8  PLT 104* 315*   Basic Metabolic Panel: Recent Labs  Lab 12/11/20 1557 12/13/20 1055 12/14/20 1100  NA 140  --  138  K 3.8  --  4.4  CL 91*  --  88*  CO2 40*  --  38*  GLUCOSE 93  --  92  BUN 17  --  18  CREATININE 3.92*  --  3.96*  CALCIUM 8.3*  --  8.4*  PHOS 2.3* 2.5  --    GFR: Estimated Creatinine Clearance: 15.9 mL/min (A) (by C-G formula based on SCr of 3.96 mg/dL (H)). Liver Function Tests: Recent Labs  Lab 12/11/20 1557  ALBUMIN 2.6*   No results for input(s): LIPASE, AMYLASE in the last 168 hours. No results for input(s): AMMONIA in the last 168 hours. Coagulation Profile: No results for input(s): INR, PROTIME in the last 168 hours. Cardiac Enzymes: No results  for input(s): CKTOTAL, CKMB, CKMBINDEX, TROPONINI in the last 168 hours. BNP (last 3 results) No results for input(s): PROBNP in the last 8760 hours. HbA1C: No results for input(s): HGBA1C in the last 72 hours. CBG: Recent Labs  Lab 12/15/20 2209 12/15/20 2252 12/15/20 2339 12/16/20 0811 12/16/20 1308  GLUCAP 50* 57* 88 66* 108*   Lipid Profile: No results for input(s): CHOL, HDL, LDLCALC, TRIG, CHOLHDL, LDLDIRECT in the last 72 hours. Thyroid Function Tests: No results for input(s): TSH, T4TOTAL, FREET4, T3FREE, THYROIDAB in the last 72 hours. Anemia Panel: No results for input(s): VITAMINB12, FOLATE, FERRITIN, TIBC, IRON, RETICCTPCT in the last 72 hours. Sepsis Labs: No results for input(s): PROCALCITON, LATICACIDVEN in the last 168 hours.  Recent Results (from the past 240 hour(s))  SARS CORONAVIRUS 2 (TAT 6-24 HRS) Nasopharyngeal Nasopharyngeal Swab     Status: None  Collection Time: 12/12/20 11:15 AM   Specimen: Nasopharyngeal Swab  Result Value Ref Range Status   SARS Coronavirus 2 NEGATIVE NEGATIVE Final    Comment: (NOTE) SARS-CoV-2 target nucleic acids are NOT DETECTED.  The SARS-CoV-2 RNA is generally detectable in upper and lower respiratory specimens during the acute phase of infection. Negative results do not preclude SARS-CoV-2 infection, do not rule out co-infections with other pathogens, and should not be used as the sole basis for treatment or other patient management decisions. Negative results must be combined with clinical observations, patient history, and epidemiological information. The expected result is Negative.  Fact Sheet for Patients: SugarRoll.be  Fact Sheet for Healthcare Providers: https://www.woods-mathews.com/  This test is not yet approved or cleared by the [place] FDA and  has been authorized for detection and/or diagnosis of SARS-CoV-2 by FDA under an Emergency Use Authorization  (EUA). This EUA will remain  in effect (meaning this test can be used) for the duration of the COVID-19 declaration under Se ction 564(b)(1) of the Act, 21 U.S.C. section 360bbb-3(b)(1), unless the authorization is terminated or revoked sooner.  Performed at [facility], [place] [address] [place], [place] [address]      Radiology Studies: No results found.  Scheduled Meds: . amLODipine  10 mg Oral Daily  . atorvastatin  20 mg Oral QPM  . Chlorhexidine Gluconate Cloth  6 each Topical Daily  . epoetin (EPOGEN/PROCRIT) injection  10,000 Units Intravenous Q T,Th,Sa-HD  . feeding supplement (NEPRO CARB STEADY)  237 mL Oral BID BM  . furosemide  80 mg Intravenous Daily  . gabapentin  100 mg Oral QPM  . heparin  5,000 Units Subcutaneous Q8H  . hydrALAZINE  50 mg Oral Q8H  . hydrOXYzine  25 mg Oral Daily  . lipase/protease/amylase  36,000 Units Oral TID AC  . multivitamin  1 tablet Oral QHS  . pantoprazole  40 mg Oral Daily  . sevelamer carbonate  800 mg Oral TID WC  . cyanocobalamin  1,000 mcg Oral Daily   Continuous Infusions: . albumin human 25 g (12/15/20 1620)     LOS: 11 days   Time spent: 25 minutes. More than 50% of the time was spent in counseling/coordination of care  Lorella Nimrod, MD [facility]  If 7PM-7AM, please contact night-coverage [email or web address]  12/16/2020, 3:13 PM   This record has been created using Systems analyst. Errors have been sought and corrected,but may not always be located. Such creation errors do not reflect on the standard of care.

## 2020-12-16 NOTE — Progress Notes (Signed)
BG checked 88 mg/dL. Pt eating

## 2020-12-16 NOTE — Progress Notes (Signed)
Blood sugar rechecked 57 mg/dL. 2nd orange juice given. Pt still has ate. Again pt encouraged to eat.

## 2020-12-16 NOTE — Progress Notes (Signed)
[facility], [place] 12/16/20  Subjective:   LOS: 11    Patient seen today on second floor Patient resting comfortably Patient voices no new physical concerns. Patient's major complaint in today visit was that she wishes to go home    Objective:  Vital signs in last 24 hours:  Temp:  [97.5 F (36.4 C)-98.2 F (36.8 C)] 97.5 F (36.4 C) (03/27 0427) Pulse Rate:  [51-64] 53 (03/27 0427) Resp:  [0-20] 16 (03/27 0427) BP: (128-155)/(56-81) 128/58 (03/27 0427) SpO2:  [100 %] 100 % (03/27 0427)  Weight change:  Filed Weights   12/09/20 0247 12/11/20 1300 12/12/20 1149  Weight: 83.5 kg 88 kg 83.5 kg    Intake/Output:    Intake/Output Summary (Last 24 hours) at 12/16/2020 0746 Last data filed at 12/15/2020 1900 Gross per 24 hour  Intake --  Output 3000 ml  Net -3000 ml     Physical Exam: General:  No acute distress  HEENT  moist oral mucous membranes  Pulm/lungs  Lungs clear, respiration symmetrical,unlabored  CVS/Heart  S1S2,no rubs or gallops  Abdomen:   Soft,non distended  Extremities:  1+ peripheral edema  Neurologic:  Alert, oriented able to answer questions  Skin:  No acute lesions or rashes  Access:  Left IJ PermCath       Basic Metabolic Panel:  Recent Labs  Lab 12/11/20 1557 12/13/20 1055 12/14/20 1100  NA 140  --  138  K 3.8  --  4.4  CL 91*  --  88*  CO2 40*  --  38*  GLUCOSE 93  --  92  BUN 17  --  18  CREATININE 3.92*  --  3.96*  CALCIUM 8.3*  --  8.4*  PHOS 2.3* 2.5  --      CBC: Recent Labs  Lab 12/11/20 1557 12/14/20 1100  WBC 3.1* 5.8  HGB 7.3* 8.3*  HCT 22.8* 24.6*  MCV 84.8 82.8  PLT 104* 115*      Lab Results  Component Value Date   HEPBSAG NON REACTIVE 12/06/2020   HEPBIGM  06/12/2010    NEGATIVE (NOTE) High levels of Hepatitis B Core IgM antibody are detectable during the acute stage of Hepatitis B. This antibody is used to differentiate current from past HBV infection.        Microbiology:  Recent Results (from the past 240 hour(s))  SARS CORONAVIRUS 2 (TAT 6-24 HRS) Nasopharyngeal Nasopharyngeal Swab     Status: None   Collection Time: 12/12/20 11:15 AM   Specimen: Nasopharyngeal Swab  Result Value Ref Range Status   SARS Coronavirus 2 NEGATIVE NEGATIVE Final    Comment: (NOTE) SARS-CoV-2 target nucleic acids are NOT DETECTED.  The SARS-CoV-2 RNA is generally detectable in upper and lower respiratory specimens during the acute phase of infection. Negative results do not preclude SARS-CoV-2 infection, do not rule out co-infections with other pathogens, and should not be used as the sole basis for treatment or other patient management decisions. Negative results must be combined with clinical observations, patient history, and epidemiological information. The expected result is Negative.  Fact Sheet for Patients: SugarRoll.be  Fact Sheet for Healthcare Providers: https://www.woods-mathews.com/  This test is not yet approved or cleared by the [place] FDA and  has been authorized for detection and/or diagnosis of SARS-CoV-2 by FDA under an Emergency Use Authorization (EUA). This EUA will remain  in effect (meaning this test can be used) for the duration of the COVID-19 declaration under Se ction  564(b)(1) of the Act, 21 U.S.C. section 360bbb-3(b)(1), unless the authorization is terminated or revoked sooner.  Performed at [facility], [place] [address], [place], [place] [address]     Coagulation Studies: No results for input(s): LABPROT, INR in the last 72 hours.  Urinalysis: No results for input(s): COLORURINE, LABSPEC, PHURINE, GLUCOSEU, HGBUR, BILIRUBINUR, KETONESUR, PROTEINUR, UROBILINOGEN, NITRITE, LEUKOCYTESUR in the last 72 hours.  Invalid input(s): APPERANCEUR    Imaging: No results found.   Medications:    albumin human 25 g (12/15/20 1620)    amLODipine  10 mg Oral  Daily   atorvastatin  20 mg Oral QPM   Chlorhexidine Gluconate Cloth  6 each Topical Daily   epoetin (EPOGEN/PROCRIT) injection  10,000 Units Intravenous Q T,Th,Sa-HD   feeding supplement (NEPRO CARB STEADY)  237 mL Oral BID BM   furosemide  80 mg Intravenous Daily   gabapentin  100 mg Oral QPM   heparin  5,000 Units Subcutaneous Q8H   hydrALAZINE  50 mg Oral Q8H   hydrOXYzine  25 mg Oral Daily   insulin aspart  0-5 Units Subcutaneous QHS   insulin aspart  0-9 Units Subcutaneous TID WC   lipase/protease/amylase  36,000 Units Oral TID AC   multivitamin  1 tablet Oral QHS   naphazoline-glycerin  1-2 drop Both Eyes BID   pantoprazole  40 mg Oral Daily   sevelamer carbonate  800 mg Oral TID WC   cyanocobalamin  1,000 mcg Oral Daily   calcium carbonate, diphenhydrAMINE, HYDROcodone-acetaminophen, meclizine, sevelamer carbonate  Assessment/ Plan:  60 y.o. female with end-stage renal disease, on chronic diastolic CHF, diabetes type 2, thoracic discitis T7-8 completed treatment jan 2022 was admitted on 12/04/2020 for  Principal Problem:   Adjustment disorder with mixed anxiety and depressed mood Active Problems:   End-stage renal disease on hemodialysis ([place])   HTN (hypertension)   Hypoglycemia associated with diabetes (HCC)   Hypokalemia   Sinus bradycardia   Severe anemia   Anemia in ESRD (end-stage renal disease) (HCC)   Fluid overload   Chronic diastolic CHF (congestive heart failure) (HCC)  Chronic pulmonary edema [J81.1] Anasarca [R60.1] ESRD (end stage renal disease) ([place]) [N18.6] Fluid overload [E87.70] Ascites [R18.8] Severe anemia [D64.9]  [facility] dialysis/TTS 1/EDW 83.5 kg/left IJ PermCath/  #. ESRD with anasarca  Patient is on hemodialysis Patient is on Tuesday Thursday Saturday schedule as outpatient Patient was last dialyzed yesterday To help with discharge planning Case management may need to address outpatient transportation .    #.  Anemia of CKD  Lab Results  Component Value Date   HGB 8.3 (L) 12/14/2020  Patient hemoglobin is not at goal Patient ESA dosing has recently been increased Patient is on EPO 10000 units with dialysis    #. Secondary hyperparathyroidism of renal origin N 25.81      Component Value Date/Time   PTH 357 (H) 10/05/2018 1241  Intact PTH is at goal Lab Results  Component Value Date   PHOS 2.5 12/13/2020  Phosphorus is at goal We will continue patient's binders   #. Diabetes type 2 with CKD Hemoglobin A1C (%)  Date Value  07/19/2013 9.3 (H)   Hgb A1c MFr Bld (%)  Date Value  12/07/2020 4.6 (L)   Patient is being closely followed by the primary care for her diabetes  #Acute hypoxic respiratory failure Patient is now clinically better   #Hypertension Patient blood pressure is at goal  Plan  No need for dialysis today  LOS: [facility] 3/27/20227:46 [place], [place]

## 2020-12-17 ENCOUNTER — Ambulatory Visit (INDEPENDENT_AMBULATORY_CARE_PROVIDER_SITE_OTHER): Payer: Medicare HMO | Admitting: Vascular Surgery

## 2020-12-17 DIAGNOSIS — F4323 Adjustment disorder with mixed anxiety and depressed mood: Secondary | ICD-10-CM | POA: Diagnosis not present

## 2020-12-17 DIAGNOSIS — I2510 Atherosclerotic heart disease of native coronary artery without angina pectoris: Secondary | ICD-10-CM

## 2020-12-17 DIAGNOSIS — I10 Essential (primary) hypertension: Secondary | ICD-10-CM

## 2020-12-17 DIAGNOSIS — T829XXD Unspecified complication of cardiac and vascular prosthetic device, implant and graft, subsequent encounter: Secondary | ICD-10-CM

## 2020-12-17 DIAGNOSIS — I871 Compression of vein: Secondary | ICD-10-CM

## 2020-12-17 DIAGNOSIS — I872 Venous insufficiency (chronic) (peripheral): Secondary | ICD-10-CM

## 2020-12-17 LAB — GLUCOSE, CAPILLARY
Glucose-Capillary: 61 mg/dL — ABNORMAL LOW (ref 70–99)
Glucose-Capillary: 99 mg/dL (ref 70–99)
Glucose-Capillary: 113 mg/dL — ABNORMAL HIGH (ref 70–99)

## 2020-12-17 LAB — RESP PANEL BY RT-PCR (FLU A&B, COVID) ARPGX2
Influenza A by PCR: NEGATIVE
Influenza B by PCR: NEGATIVE
SARS Coronavirus 2 by RT PCR: NEGATIVE

## 2020-12-17 IMAGING — MR MR THORACIC SPINE W/O CM
5 of 6 series · 28 of 48 positions shown · non-contrast
Comparison: Prior CT from 10/11/2019

CLINICAL DATA: Initial evaluation for possible osteomyelitis.

EXAM:
MRI THORACIC SPINE WITHOUT CONTRAST
TECHNIQUE: Multiplanar, multisequence MR imaging of the thoracic spine was
performed. No intravenous contrast was administered.

[Series 7: T1 · sagittal · 6.0mm · 1.41mm/px · 2 of 9 slices shown (1 of 2)]
[im 1/9]
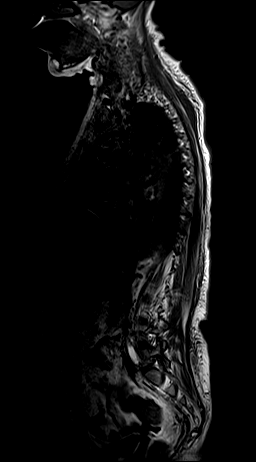
[im 9/9]
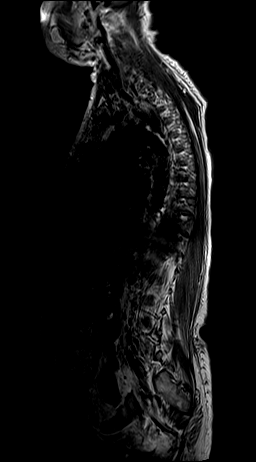

[Series 12: T2 · sagittal · 3.0mm · 1.06mm/px · 6 of 17 slices shown (1 of 2)]
[im 1/17]
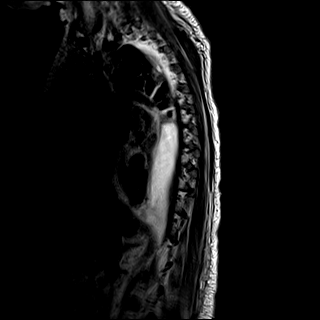
[im 4/17]
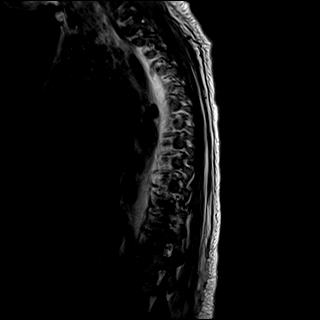
[im 7/17]
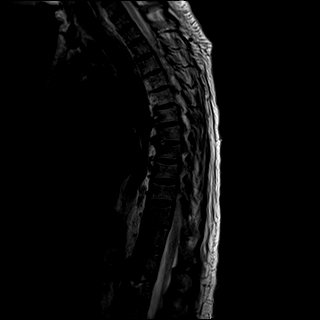
[im 10/17]
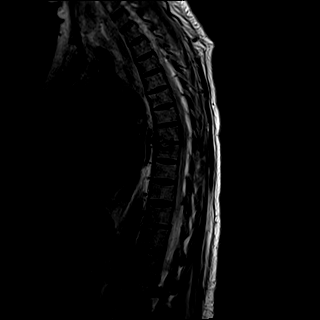
[im 13/17]
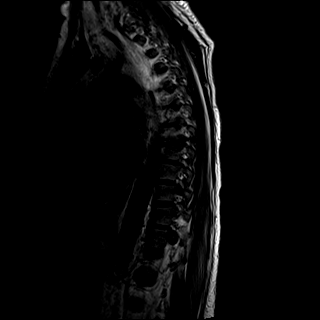
[im 17/17]
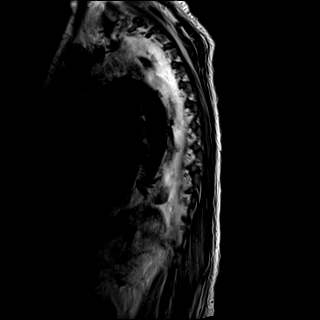

[Series 13: T1 · sagittal · 3.0mm · 1.06mm/px · 6 of 17 slices shown (2 of 2)]
[im 1/17]
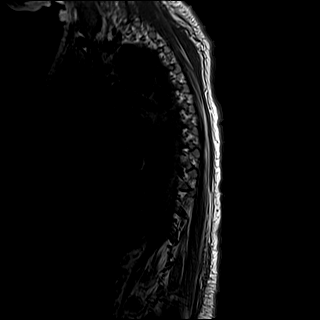
[im 4/17]
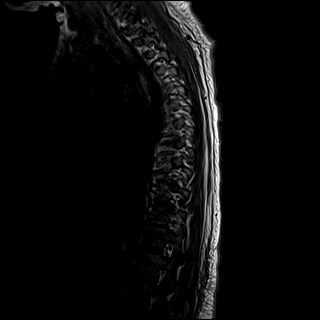
[im 7/17]
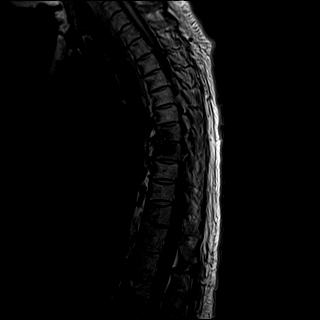
[im 10/17]
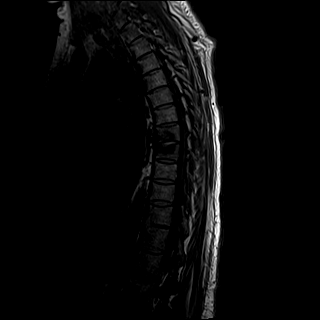
[im 13/17]
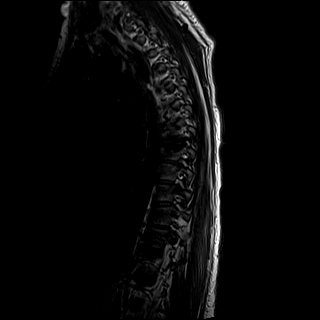
[im 17/17]
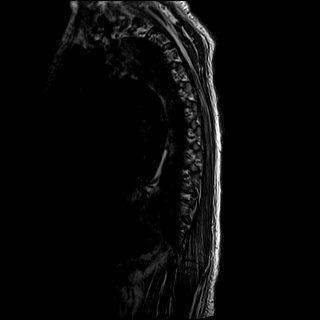

[Series 14: STIR · sagittal · 3.0mm · 0.53mm/px · 6 of 17 slices shown]
[im 1/17]
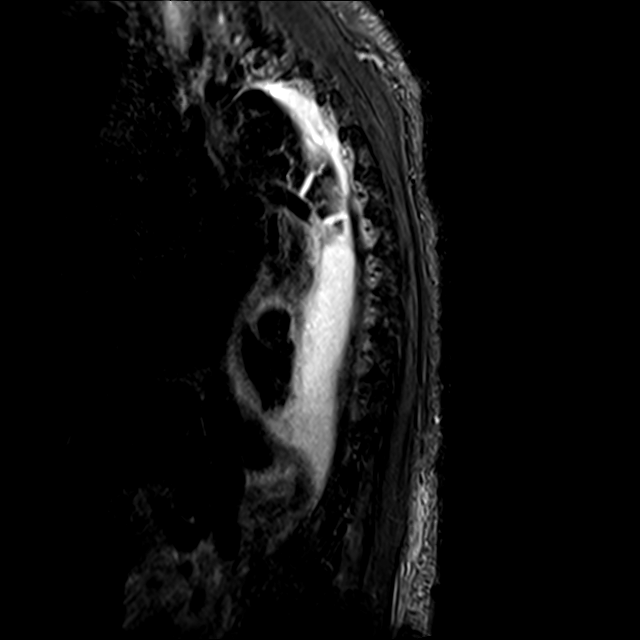
[im 4/17]
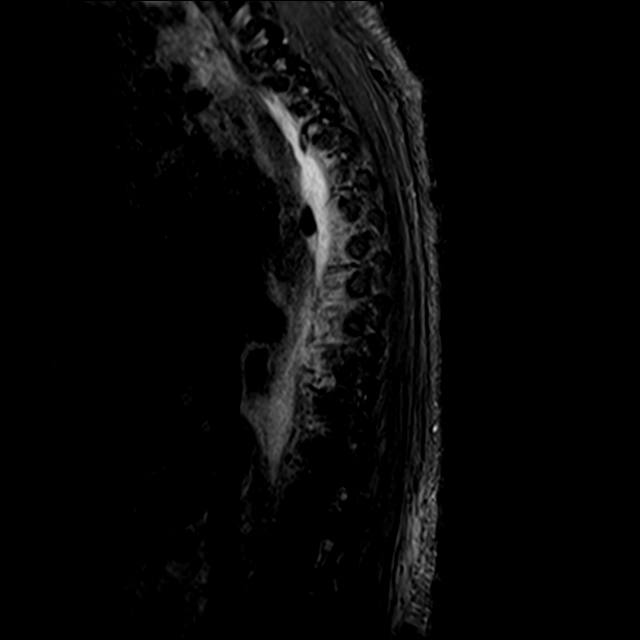
[im 7/17]
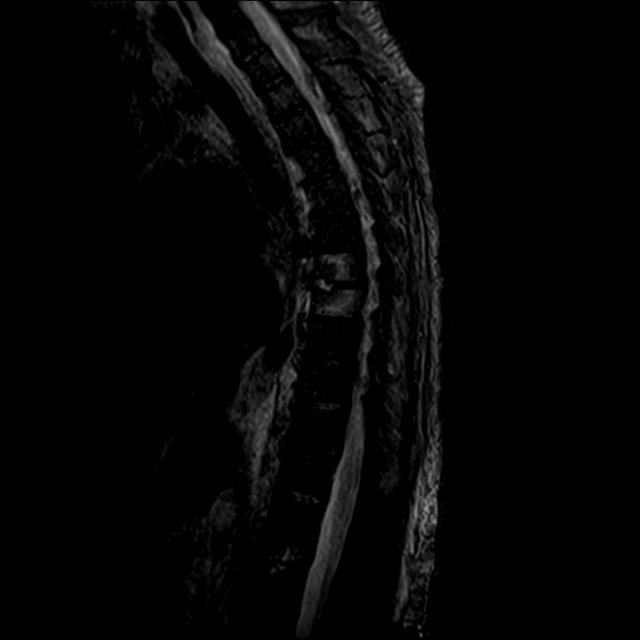
[im 10/17]
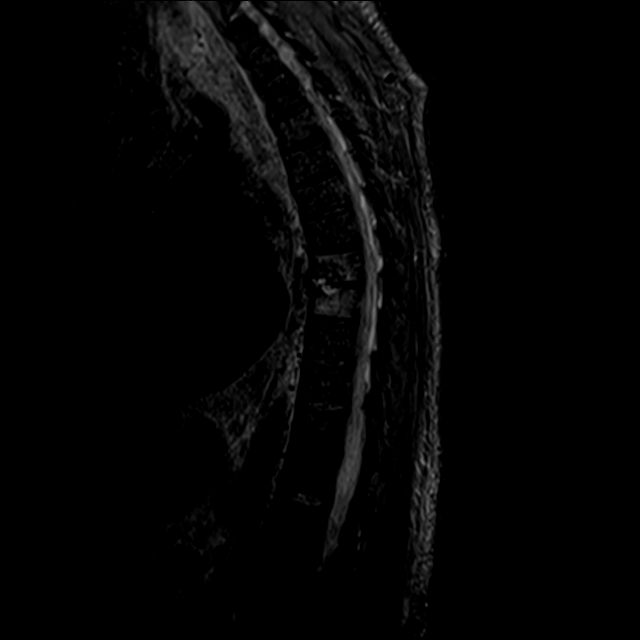
[im 13/17]
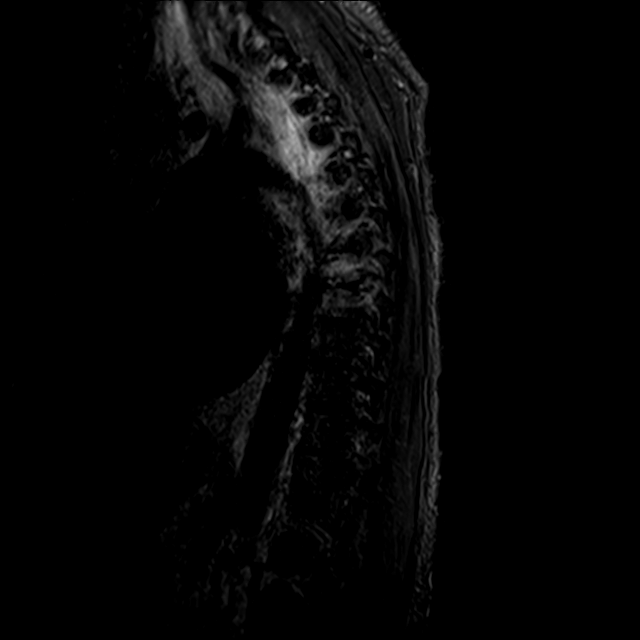
[im 17/17]
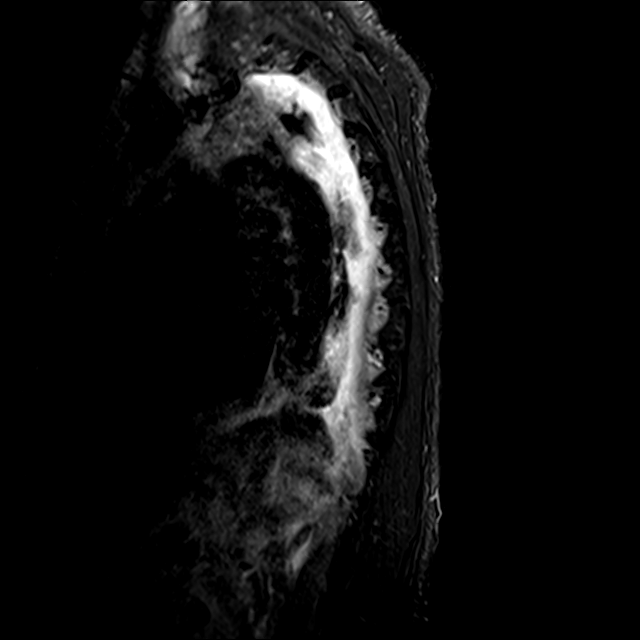

[Series 15: T2 · axial · 4.0mm · 0.59mm/px · z∈[-311,-135]mm · 8 of 39 slices shown (2 of 2)]
[im 1/39]
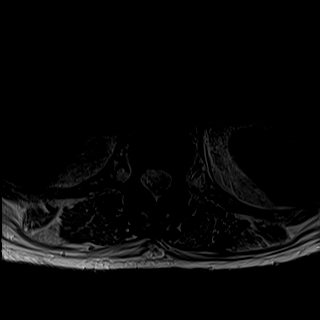
[im 6/39]
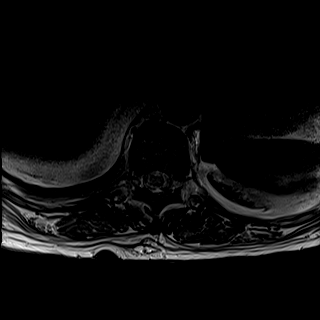
[im 12/39]
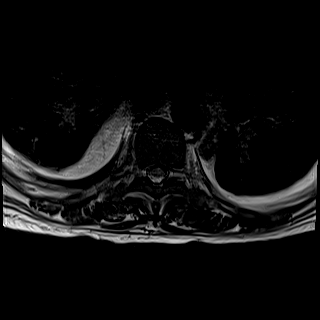
[im 18/39]
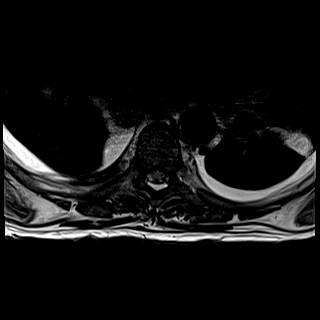
[im 21/39]
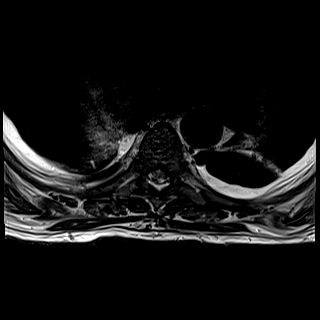
[im 27/39]
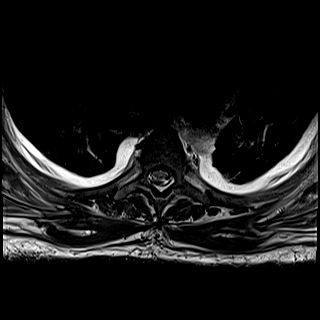
[im 33/39]
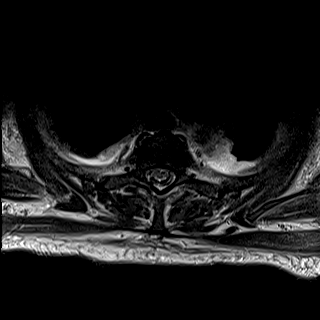
[im 39/39]
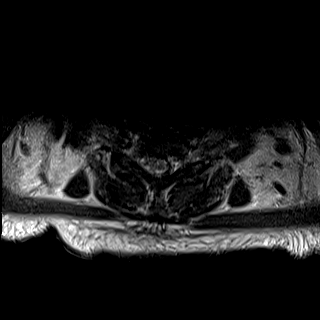

[28 of 48 positions shown; findings below may reference images not displayed]

FINDINGS: Alignment: Physiologic with preservation of the normal thoracic
kyphosis. No listhesis.

Vertebrae: Vertebral body height maintained without acute or chronic
fracture. Chronic endplate Schmorl's node deformity noted at the
superior endplate of L2. T11 and T12 vertebral bodies are partially
ankylosed anteriorly.

Intervertebral disc space narrowing with increased fluid signal
intensity seen within the T7-8 interspace anteriorly. Mild
associated endplate irregularity within the adjacent T7 and T8
endplates. Abnormal edema within the T7 and T8 vertebral bodies.
Possible minimal edema within the adjacent paraspinous soft tissues.
While these findings are somewhat nonspecific, appearance is most
concerning for possible osteomyelitis discitis. No discrete
paraspinous collection. No epidural abscess or other collection
identified.

No other evidence for acute infection within the thoracic spine.

Cord:  Normal signal and morphology.  No epidural collections.

Paraspinal and other soft tissues: Possible minimal paraspinous
edema adjacent to the T7-8 interspace. Paraspinous soft tissues
demonstrate no other acute finding. Irregular bilateral pleural
effusions noted, better evaluated on prior CT.

Disc levels:

Normal expected multilevel disc desiccation seen throughout the
thoracic spine for age. Chronic endplate Schmorl's node deformity
without edema at the superior endplate of L2. No significant disc
bulge or focal disc herniation. No significant spinal stenosis.
Foramina remain patent.
IMPRESSION: 1. Findings concerning for possible osteomyelitis discitis at the
T7-8 interspace. Correlation with symptomatology and laboratory
values recommended. No epidural abscess or other collection
identified.
2. Irregular bilateral pleural effusions, better evaluated on prior
CT.

## 2020-12-17 IMAGING — US US EXTREM  UP VENOUS*R*
1 series · 13 of 24 positions shown · non-contrast
Comparison: None.

CLINICAL DATA: Dialysis patient, previous upper extremity access
sites, venous stenting



[Series 1: us venous img upper uni right (dvt) · portal-venous · 43 acquisitions, 13 frames shown]
[im 1/43]
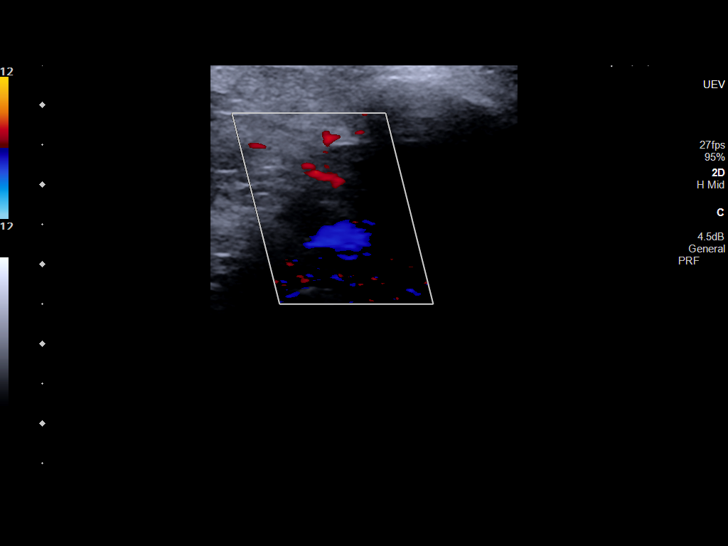
[im 4/43]
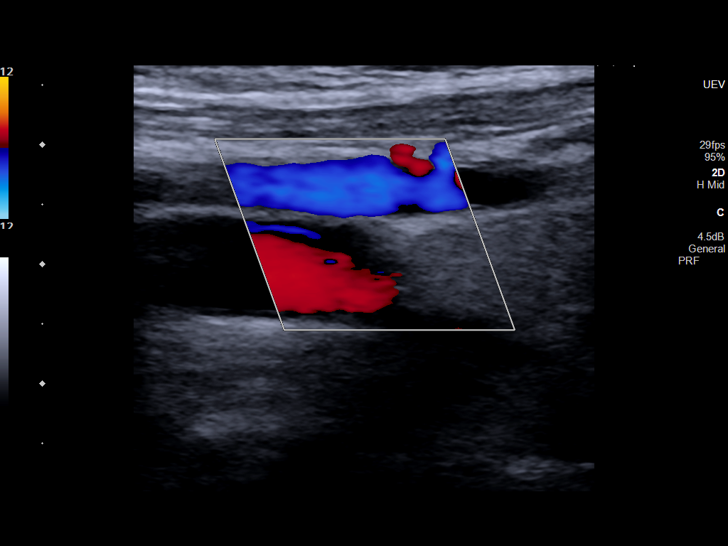
[im 8/43]
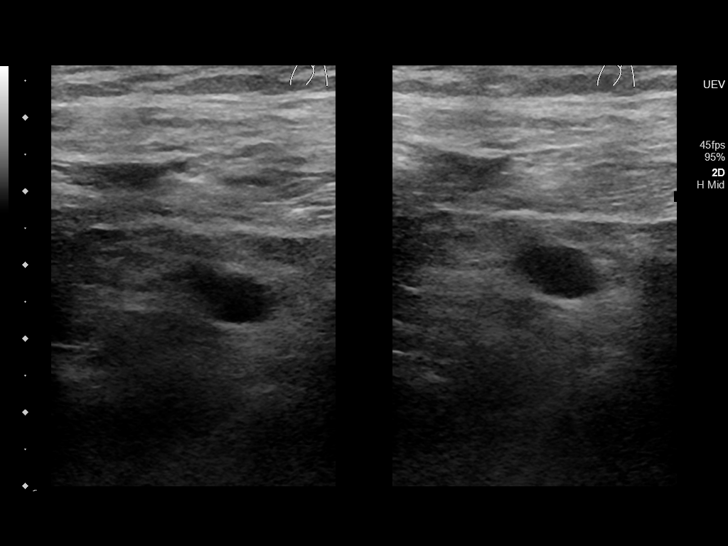
[im 11/43]
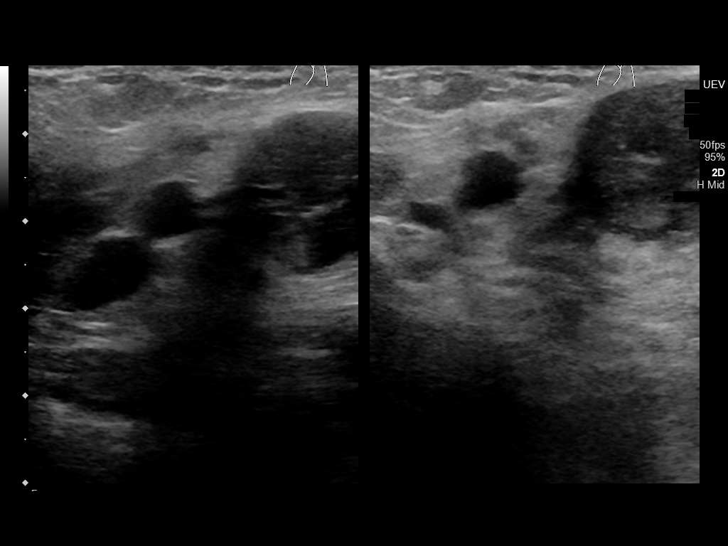
[im 15/43]
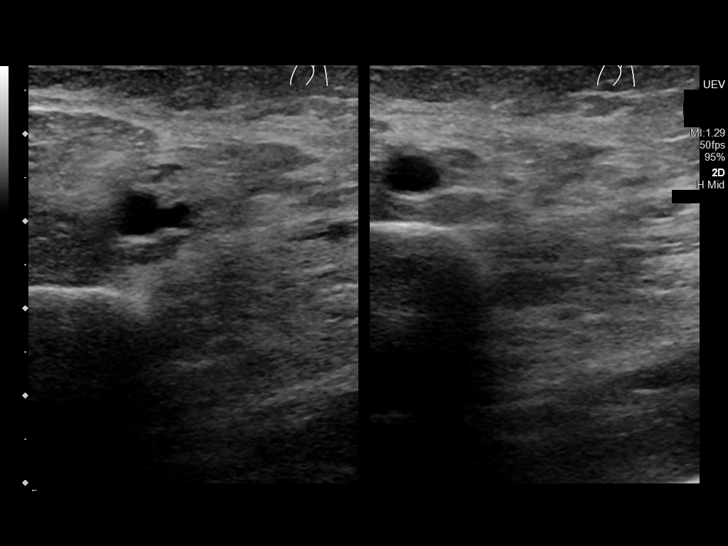
[im 19/43]
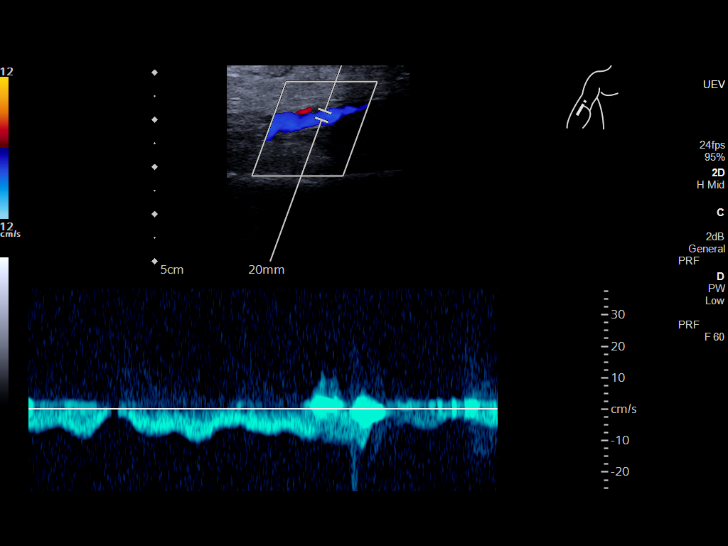
[im 24/43]
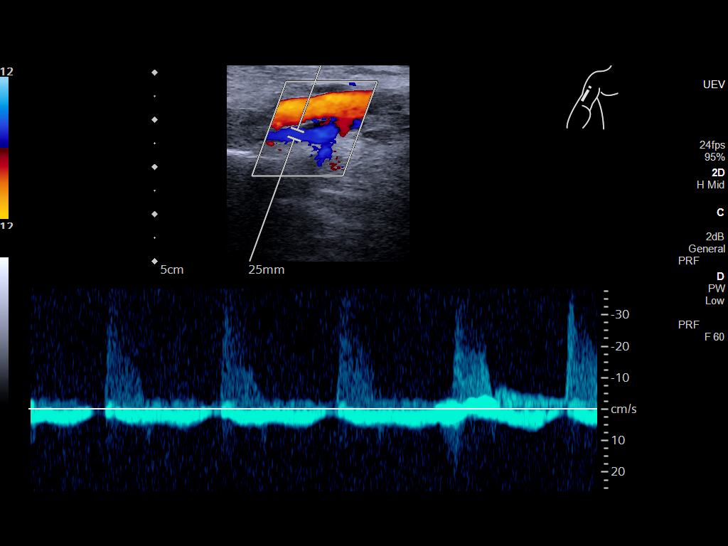
[im 26/43]
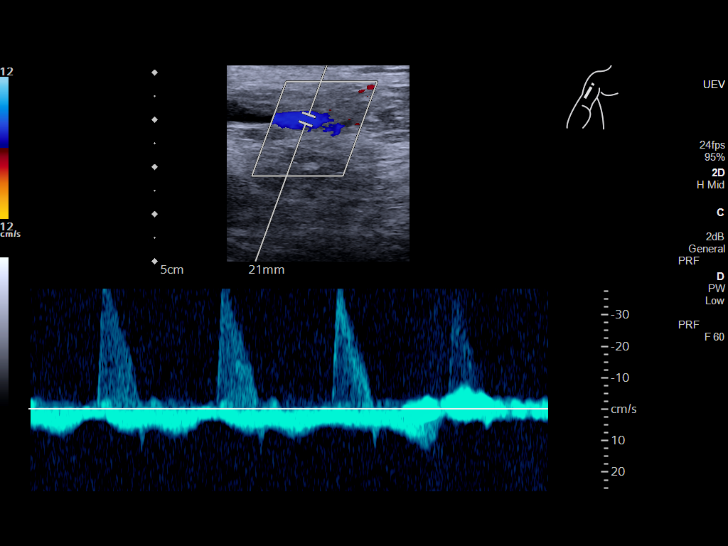
[im 30/43]
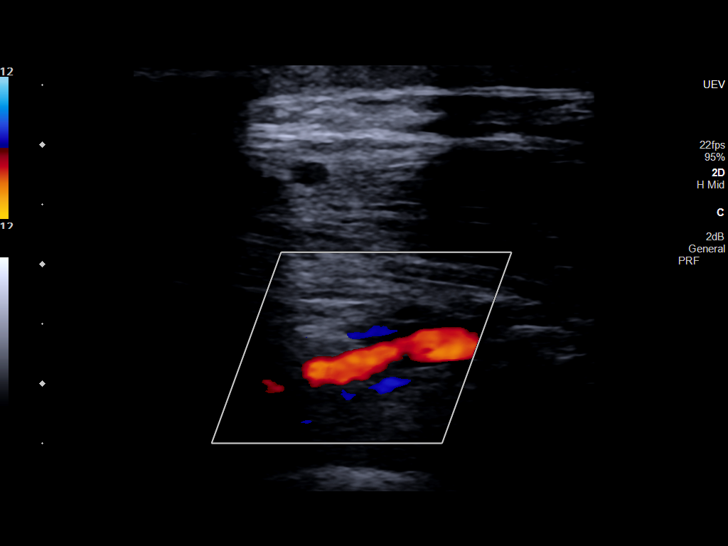
[im 33/43]
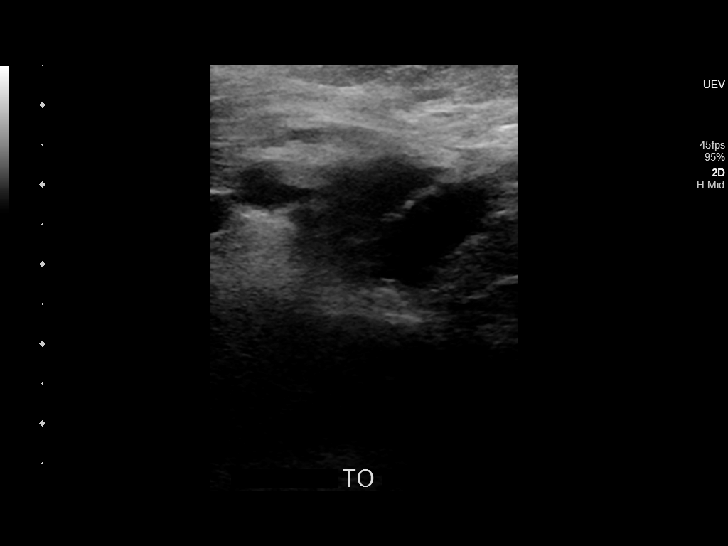
[im 37/43]
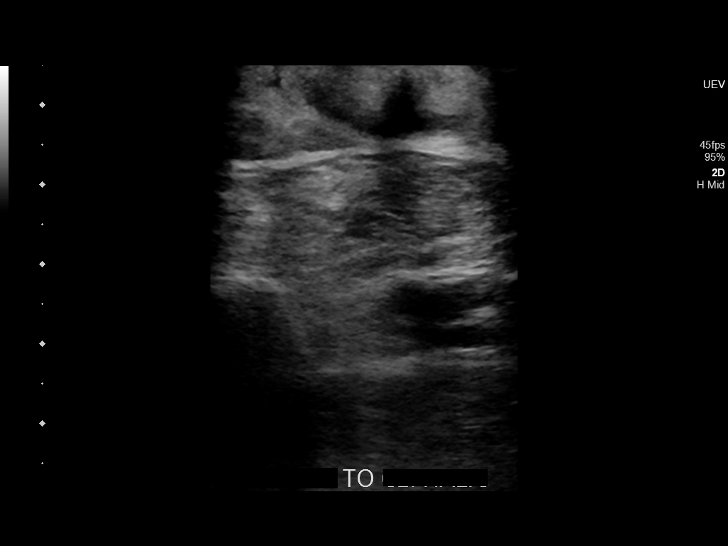
[im 41/43]
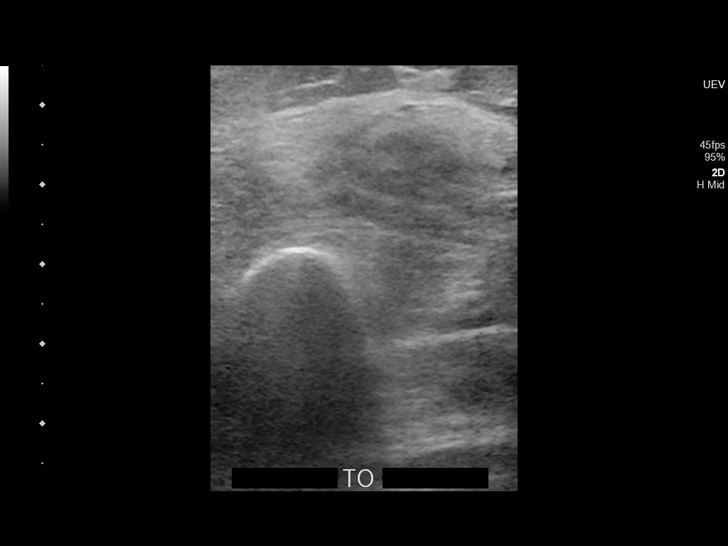
[im 43/43]
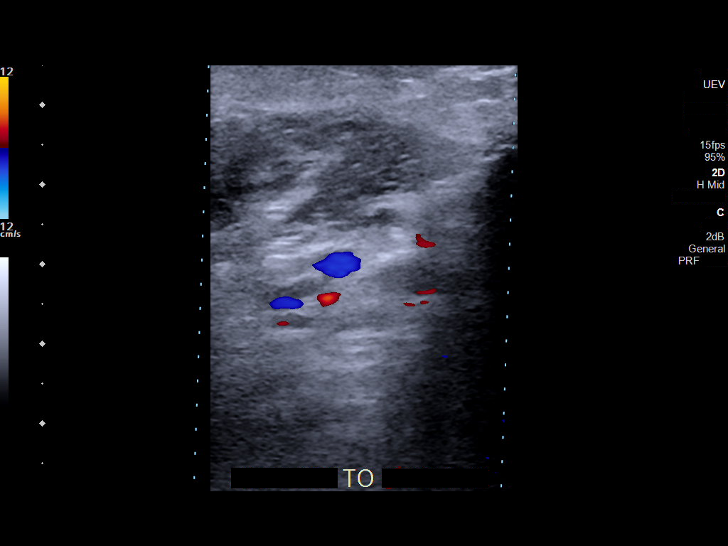

[13 of 24 positions shown; findings below may reference images not displayed]

FINDINGS: Contralateral Subclavian Vein: Respiratory phasicity is normal and
symmetric with the symptomatic side. No evidence of thrombus. Normal
compressibility.

Internal Jugular Vein: No evidence of thrombus. Normal
compressibility, respiratory phasicity and response to augmentation.

Subclavian Vein: No evidence of thrombus. Normal compressibility,
respiratory phasicity and response to augmentation.

Axillary Vein: No evidence of thrombus. Normal compressibility,
respiratory phasicity and response to augmentation.

Cephalic Vein: Not visualized related to prior dialysis access.
Suspect occluded.

Basilic Vein: No evidence of thrombus. Normal compressibility,
respiratory phasicity and response to augmentation.

Brachial Veins: No evidence of thrombus. Normal compressibility,
respiratory phasicity and response to augmentation.

Radial Veins: No evidence of thrombus. Normal compressibility,
respiratory phasicity and response to augmentation.

Ulnar Veins: No evidence of thrombus. Normal compressibility,
respiratory phasicity and response to augmentation.

Other Findings: Old right upper extremity dialysis access is
occluded.
IMPRESSION: No significant acute right upper extremity occlusive DVT.

## 2020-12-17 MED ORDER — DEXTROSE 50 % IV SOLN
INTRAVENOUS | Status: AC
  Filled 2020-12-17: qty 50

## 2020-12-17 MED ORDER — NEPRO/CARBSTEADY PO LIQD: 237.0000 mL | Freq: Two times a day (BID) | ORAL | 0 refills | Status: DC

## 2020-12-17 MED ORDER — GLUCOSE 40 % PO GEL
ORAL | Status: AC
  Administered 2020-12-17: 37.5 g
  Filled 2020-12-17: qty 1

## 2020-12-17 MED ORDER — HYDROCODONE-ACETAMINOPHEN 5-325 MG PO TABS: 1.0000 | ORAL_TABLET | Freq: Three times a day (TID) | ORAL | 0 refills | Status: DC | PRN

## 2020-12-17 NOTE — TOC Transition Note (Signed)
Transition of Care [facility]) - CM/SW Discharge Note   Patient Details  Name: [name] MRN: [id number] Date of Birth: March 10, 1961  [facility] of [facility]) CM/SW Contact:  Candie Chroman, LCSW Phone Number: 12/17/2020, 3:08 PM   Clinical Narrative: Patient has orders to discharge to [facility] today. CSW called [facility] and confirmed patient will have insurance through them on 4/1 (Member ID: [id number]). RN will call report to [phone]. EMS transport has been arranged and she is 2nd on the list. No further concerns. CSW signing off.    Final next level of care: Skilled Nursing Facility Barriers to Discharge: Barriers Resolved   Patient Goals and CMS Choice Patient states their goals for this hospitalization and ongoing recovery are:: for pt to get better   Choice offered to / list presented to : [place]  Discharge Placement   Existing PASRR number confirmed : 12/11/20          Patient chooses bed at: [facility] Patient to be transferred to facility by: EMS Name of family member notified: [name] Patient and family notified of of transfer: 12/17/20  Discharge Plan and Services In-house Referral: Clinical Social Work   Post Acute Care Choice: [place]                               Social Determinants of Health (SDOH) Interventions     Readmission Risk Interventions Readmission Risk Prevention Plan 12/11/2020 09/21/2020 08/14/2020  Transportation Screening Complete Complete -  Medication Review [employer]) Complete Complete -  PCP or Specialist appointment within 3-5 days of discharge Complete Complete Complete  HRI or Home Care Consult Complete Complete Complete  SW Recovery Care/Counseling Consult Complete Complete Complete  Palliative Care Screening Not Applicable Complete Complete  [place] Not Complete Complete Not Applicable  SNF Comments working on placement - -  Some recent  data might be hidden

## 2020-12-17 NOTE — TOC Progression Note (Addendum)
Transition of Care [facility]) - Progression Note    Patient Details  Name: [name] MRN: [id number] Date of Birth: 27-Aug-1961  Transition [facility]) CM/SW Contact  Candie Chroman, LCSW Phone Number: 12/17/2020, 10:01 AM  Clinical Narrative: Josem Kaufmann still pending. [employer] case worker told SNF admissions coordinator that he would let her know decision by 12:00.    10:24 am: Auth approved. Asked MD to order rapid COVID test.  10:46 am: Called patient in room and provided update. Tried calling her son. Voicemail is full.  11:34 am: Patient's insurance expires 3/31. SNF admissions coordinator called patient who stated she had not called to get a new policy. Admissions coordinator will discuss with their business Freight forwarder.  12:23 pm: [facility] and confirmed patient's policy would be terminated 3/31.  Expected Discharge Plan: [place] Barriers to Discharge: Continued Medical Work up  Expected Discharge Plan and Services Expected Discharge Plan: [place] In-house Referral: Clinical Social Work   Post Acute Care Choice: [place] Living arrangements for the past 2 months: [place] Determinants of Health (SDOH) Interventions    Readmission Risk Interventions Readmission Risk Prevention Plan 12/11/2020 09/21/2020 08/14/2020  Transportation Screening Complete Complete -  Medication Review Press photographer) Complete Complete -  PCP or Specialist appointment within 3-5 days of discharge Complete Complete Complete  HRI or Home Care Consult Complete Complete Complete  SW Recovery Care/Counseling Consult Complete Complete Complete  Palliative Care Screening Not Applicable Complete Complete  [place] Not Complete Complete Not Applicable  SNF Comments working on placement - -  Some recent data might be hidden

## 2020-12-17 NOTE — Progress Notes (Signed)
OT Cancellation Note  Patient Details Name: [name] MRN: [id number] DOB: 08-05-1961   Cancelled Treatment:    Reason Eval/Treat Not Completed: Patient declined, no reason specified   Attempted to engage pt in OT treatment, but pt declined.  Pt currently eating breakfast and reported wanting to focus on eating and raising her blood sugar rather than therapy this AM.  Will continue to follow up at next opportunity.  Myrtie Hawk Jasline Buskirk, OTR/L 12/17/20, 10:03 AM

## 2020-12-17 NOTE — Progress Notes (Signed)
Patient discharged to [facility].  Report called to facility and written instructions placed in facility packet.  PIV EJ removed prior to discharge. Patient states she has spoken to her family and let them know she was being discharged. Awaiting EMS transport.

## 2020-12-17 NOTE — Progress Notes (Signed)
Patient BG 61.  Oral glucose gel given and ate partial breakfast.  Patient alert and oriented, and states feels better.

## 2020-12-17 NOTE — Care Management Important Message (Signed)
Important Message  Patient Details  Name: [name] MRN: [id number] Date of Birth: 04/05/1961   Medicare Important Message Given:  Yes  Reviewed with patient via cell due to isolation status.  Copy of Medicare IM to be delivered to patient via nursing staff.    Called and spoke with son, [name], upon patient's request.  Copy of Medicare IM to be emailed securely to son's attention.  Son will call back with email address after he finishes with work meeting.     Dannette Barbara 12/17/2020, 1:48 PM

## 2020-12-17 NOTE — Progress Notes (Signed)
[facility]) Hospital Liaison note:  This patient is currently enrolled in [facility] outpatient-based palliative care. Will continue to follow for disposition.  Please call with any outpatient palliative questions or concerns.  Thank you, Lorelee Market, LPN [facility] Liaison [phone]

## 2020-12-17 NOTE — Progress Notes (Signed)
EMS transported to facility.

## 2020-12-17 NOTE — Progress Notes (Signed)
[facility], [place] 12/17/20  Subjective:   LOS: 12  Patient seen sleeping Says she had a low blood sugar this morning Denies nausea and vomiting Denies shortness of breath    Objective:  Vital signs in last 24 hours:  Temp:  [97.5 F (36.4 C)-97.7 F (36.5 C)] 97.5 F (36.4 C) (03/28 0800) Pulse Rate:  [59-63] 63 (03/28 1153) Resp:  [16-20] 18 (03/28 1153) BP: (131-142)/(59-65) 142/64 (03/28 1153) SpO2:  [98 %-100 %] 100 % (03/28 1153)  Weight change:  Filed Weights   12/09/20 0247 12/11/20 1300 12/12/20 1149  Weight: 83.5 kg 88 kg 83.5 kg    Intake/Output:    Intake/Output Summary (Last 24 hours) at 12/17/2020 1312 Last data filed at 12/16/2020 2217 Gross per 24 hour  Intake 0 ml  Output -  Net 0 ml     Physical Exam: General:  No acute distress  HEENT  moist oral mucous membranes  Pulm/lungs  Lungs clear, respiration symmetrical,unlabored  CVS/Heart  S1S2,no rubs or gallops  Abdomen:   Soft,non distended  Extremities:  min peripheral edema  Neurologic:  Alert, oriented able to answer questions  Skin:  No acute lesions or rashes  Access:  Left IJ PermCath       Basic Metabolic Panel:  Recent Labs  Lab 12/11/20 1557 12/13/20 1055 12/14/20 1100  NA 140  --  138  K 3.8  --  4.4  CL 91*  --  88*  CO2 40*  --  38*  GLUCOSE 93  --  92  BUN 17  --  18  CREATININE 3.92*  --  3.96*  CALCIUM 8.3*  --  8.4*  PHOS 2.3* 2.5  --      CBC: Recent Labs  Lab 12/11/20 1557 12/14/20 1100  WBC 3.1* 5.8  HGB 7.3* 8.3*  HCT 22.8* 24.6*  MCV 84.8 82.8  PLT 104* 115*      Lab Results  Component Value Date   HEPBSAG NON REACTIVE 12/06/2020   HEPBIGM  06/12/2010    NEGATIVE (NOTE) High levels of Hepatitis B Core IgM antibody are detectable during the acute stage of Hepatitis B. This antibody is used to differentiate current from past HBV infection.       Microbiology:  Recent Results (from the past 240 hour(s))   SARS CORONAVIRUS 2 (TAT 6-24 HRS) Nasopharyngeal Nasopharyngeal Swab     Status: None   Collection Time: 12/12/20 11:15 AM   Specimen: Nasopharyngeal Swab  Result Value Ref Range Status   SARS Coronavirus 2 NEGATIVE NEGATIVE Final    Comment: (NOTE) SARS-CoV-2 target nucleic acids are NOT DETECTED.  The SARS-CoV-2 RNA is generally detectable in upper and lower respiratory specimens during the acute phase of infection. Negative results do not preclude SARS-CoV-2 infection, do not rule out co-infections with other pathogens, and should not be used as the sole basis for treatment or other patient management decisions. Negative results must be combined with clinical observations, patient history, and epidemiological information. The expected result is Negative.  Fact Sheet for Patients: SugarRoll.be  Fact Sheet for Healthcare Providers: https://www.woods-mathews.com/  This test is not yet approved or cleared by the [place] FDA and  has been authorized for detection and/or diagnosis of SARS-CoV-2 by FDA under an Emergency Use Authorization (EUA). This EUA will remain  in effect (meaning this test can be used) for the duration of the COVID-19 declaration under Se ction 564(b)(1) of the Act, 21 U.S.C. section 360bbb-3(b)(1), unless  the authorization is terminated or revoked sooner.  Performed at [facility], [place] [address] [place], [place] [address]     Coagulation Studies: No results for input(s): LABPROT, INR in the last 72 hours.  Urinalysis: No results for input(s): COLORURINE, LABSPEC, PHURINE, GLUCOSEU, HGBUR, BILIRUBINUR, KETONESUR, PROTEINUR, UROBILINOGEN, NITRITE, LEUKOCYTESUR in the last 72 hours.  Invalid input(s): APPERANCEUR    Imaging: No results found.   Medications:   . albumin human 25 g (12/15/20 1620)   . amLODipine  10 mg Oral Daily  . atorvastatin  20 mg Oral QPM  . Chlorhexidine Gluconate  Cloth  6 each Topical Daily  . epoetin (EPOGEN/PROCRIT) injection  10,000 Units Intravenous Q T,Th,Sa-HD  . feeding supplement (NEPRO CARB STEADY)  237 mL Oral BID BM  . furosemide  80 mg Intravenous Daily  . gabapentin  100 mg Oral QPM  . heparin  5,000 Units Subcutaneous Q8H  . hydrALAZINE  50 mg Oral Q8H  . hydrOXYzine  25 mg Oral Daily  . lipase/protease/amylase  36,000 Units Oral TID AC  . multivitamin  1 tablet Oral QHS  . pantoprazole  40 mg Oral Daily  . sevelamer carbonate  800 mg Oral TID WC  . cyanocobalamin  1,000 mcg Oral Daily   calcium carbonate, diphenhydrAMINE, HYDROcodone-acetaminophen, meclizine, sevelamer carbonate  Assessment/ Plan:  60 y.o. female with end-stage renal disease, on chronic diastolic CHF, diabetes type 2, thoracic discitis T7-8 completed treatment jan 2022 was admitted on 12/04/2020 for  Principal Problem:   Adjustment disorder with mixed anxiety and depressed mood Active Problems:   End-stage renal disease on hemodialysis (HCC)   HTN (hypertension)   Hypoglycemia associated with diabetes ([place])   Hypokalemia   Sinus bradycardia   Severe anemia   Anemia in ESRD (end-stage renal disease) (HCC)   Fluid overload   Chronic diastolic CHF (congestive heart failure) (HCC)  Chronic pulmonary edema [J81.1] Anasarca [R60.1] ESRD (end stage renal disease) ([place]) [N18.6] Fluid overload [E87.70] Ascites [R18.8] Severe anemia [D64.9]  [facility] dialysis/TTS 1/EDW 83.5 kg/left IJ PermCath/  #. ESRD with anasarca Scheduled for dialysis tomorrow UF goal 2 Outpatient transportation needs being managed by case management   #. Anemia of CKD  Lab Results  Component Value Date   HGB 8.3 (L) 12/14/2020   EPO 10000 units with dialysis    #. Secondary hyperparathyroidism of renal origin N 25.81      Component Value Date/Time   PTH 357 (H) 10/05/2018 1241   Lab Results  Component Value Date   PHOS 2.5 12/13/2020   Sevelamer 800 mg with  meals  Acceptable range acheived   #. Diabetes type 2 with CKD Hemoglobin A1C (%)  Date Value  07/19/2013 9.3 (H)   Hgb A1c MFr Bld (%)  Date Value  12/07/2020 4.6 (L)   Stable Diabetic management per primary team   LOS: [place] 3/28/20221:12 PM  [place], [place]

## 2020-12-18 DIAGNOSIS — J9611 Chronic respiratory failure with hypoxia: Secondary | ICD-10-CM | POA: Insufficient documentation

## 2020-12-19 IMAGING — XA IR INJECT/[PERSON_NAME]/INC NEEDLE/CATH/PLC EPI/CERV/THOR W/IMG
5 series · 15 of 20 positions shown · non-contrast
Comparison: Thoracic spine MRI-08/11/2020;

INDICATION: Concern for discitis/osteomyelitis involving T7-T8. Please perform
fluoroscopic guided aspiration for diagnostic purposes.

EXAM:
IR INJECT/JUMPER/INC NEEDLE/OGUNOYE/DELMER EPI/CERV/THOR W/IMG

[Series 1: fluoroscopy - stored · 3 of 7 frames shown (1 of 5)]
[frame 1/7]
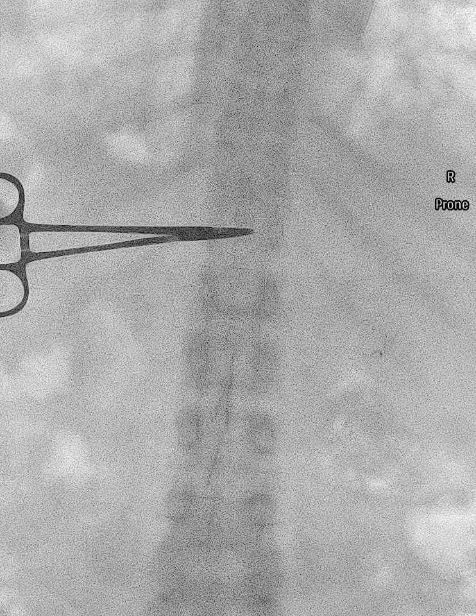
[frame 4/7]
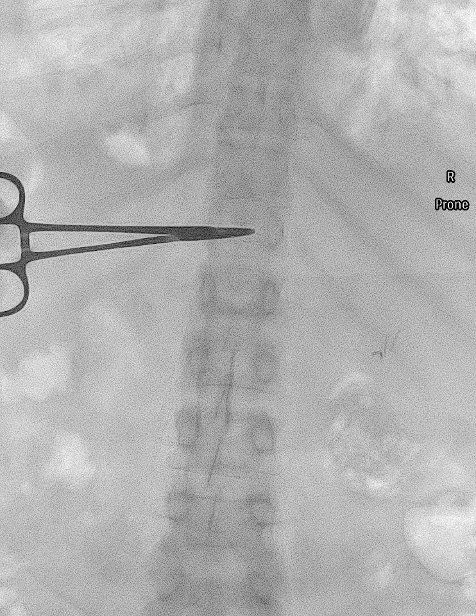
[frame 6/7]
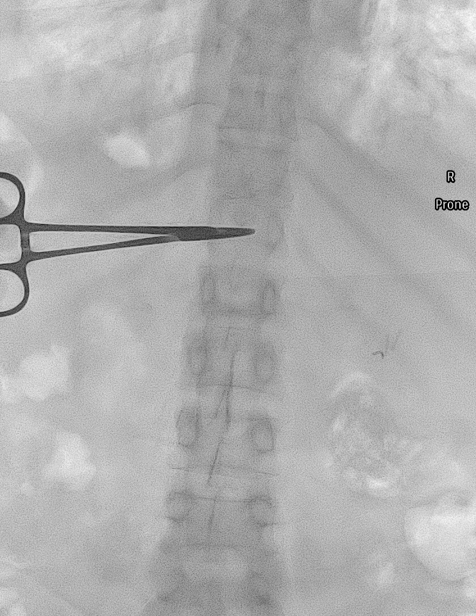

[Series 2: fluoroscopy - stored · 3 of 7 frames shown (2 of 5)]
[frame 1/7]
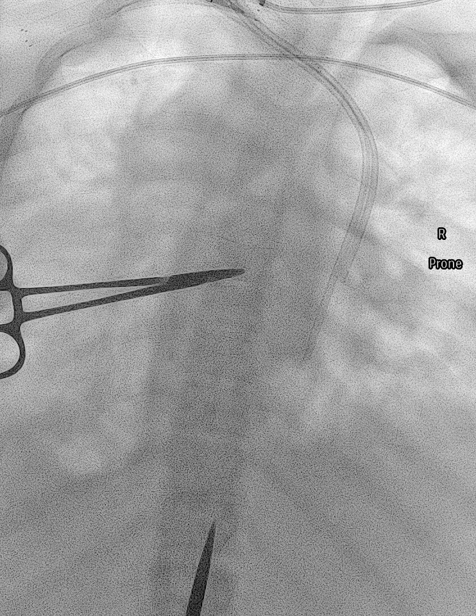
[frame 4/7]
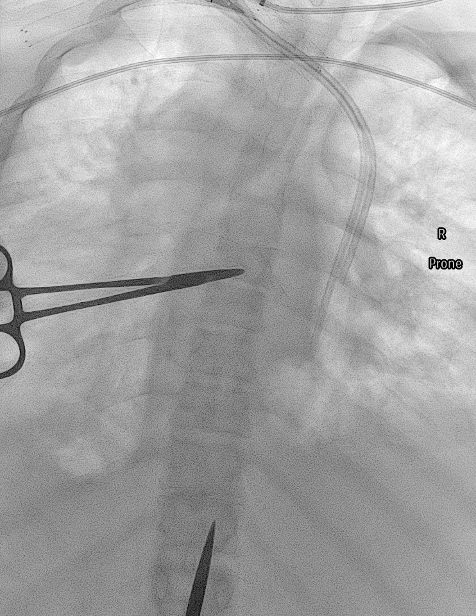
[frame 6/7]
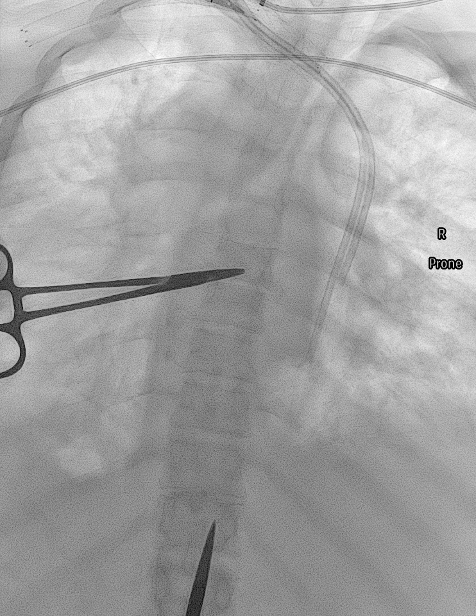

[Series 3: fluoroscopy - stored · 3 of 19 frames shown (3 of 5)]
[frame 1/19]
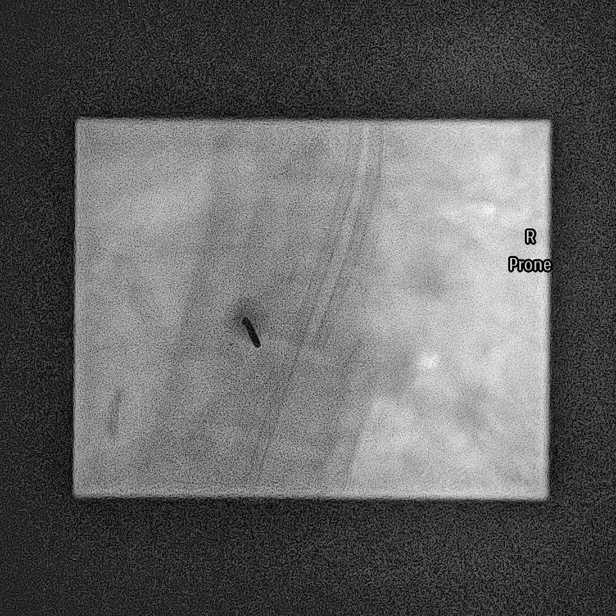
[frame 10/19]
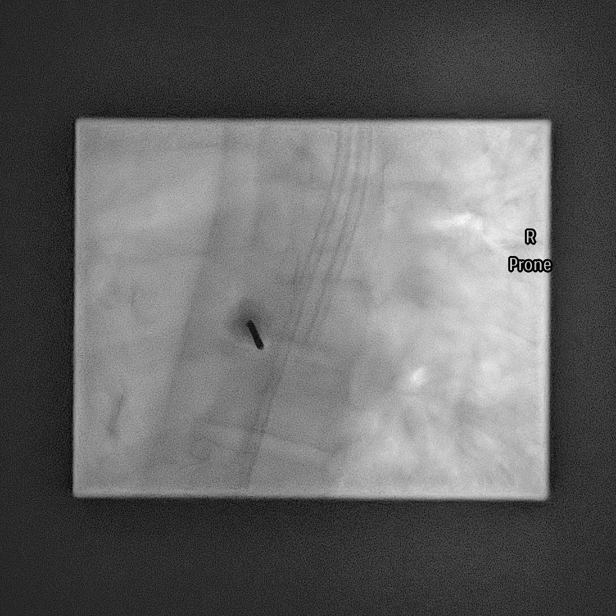
[frame 17/19]
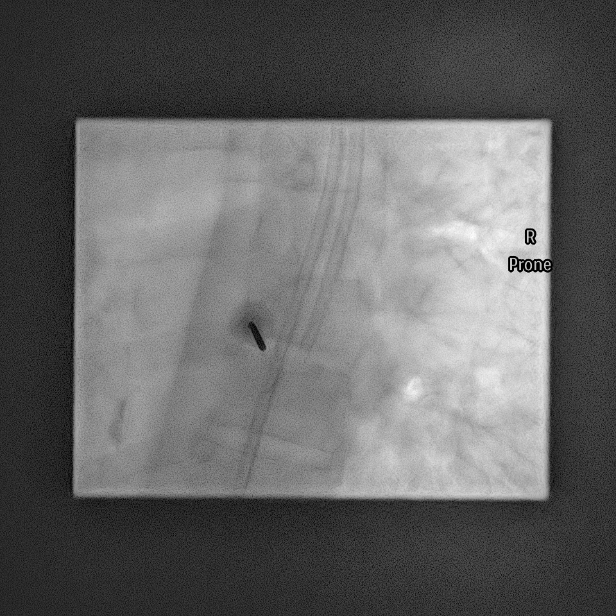

[Series 4: fluoroscopy - stored · 3 of 20 frames shown (4 of 5)]
[frame 1/20]
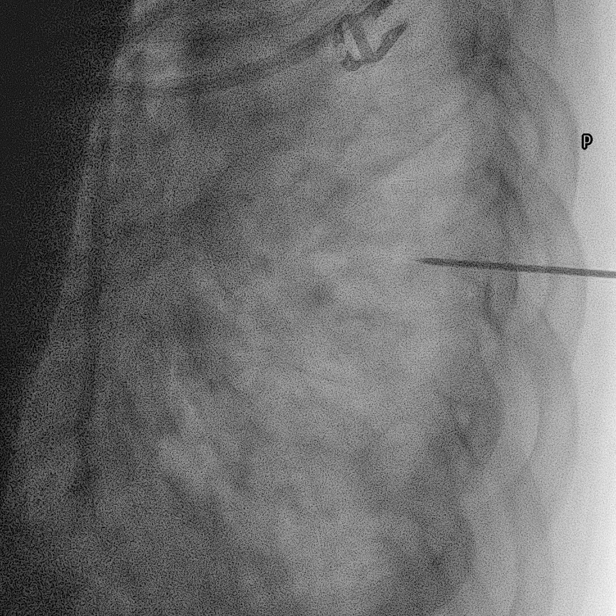
[frame 11/20]
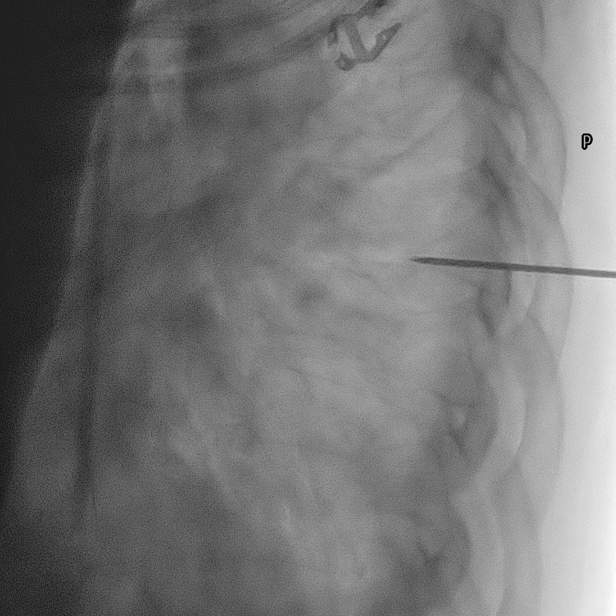
[frame 18/20]
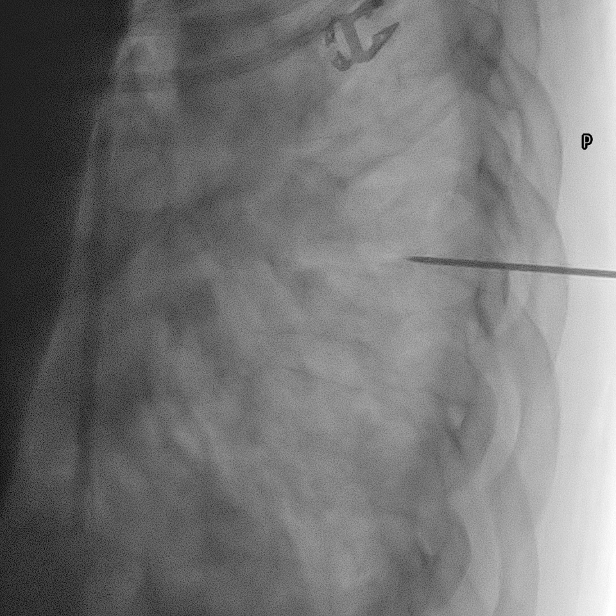

[Series 5: fluoroscopy - stored · 3 of 14 frames shown (5 of 5)]
[frame 1/14]
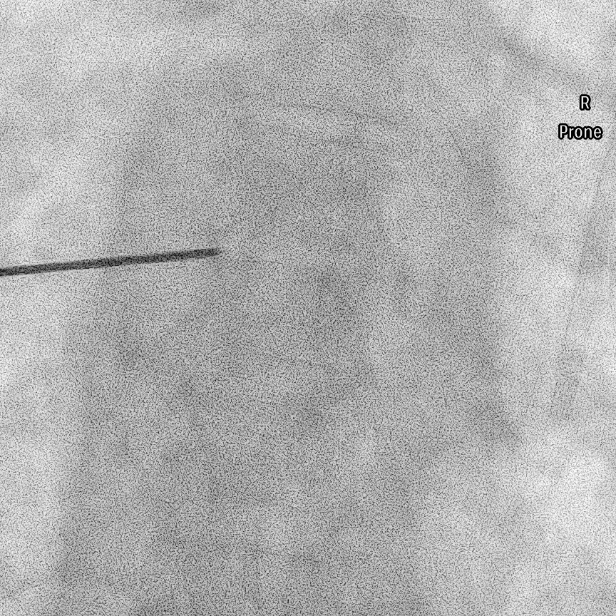
[frame 8/14]
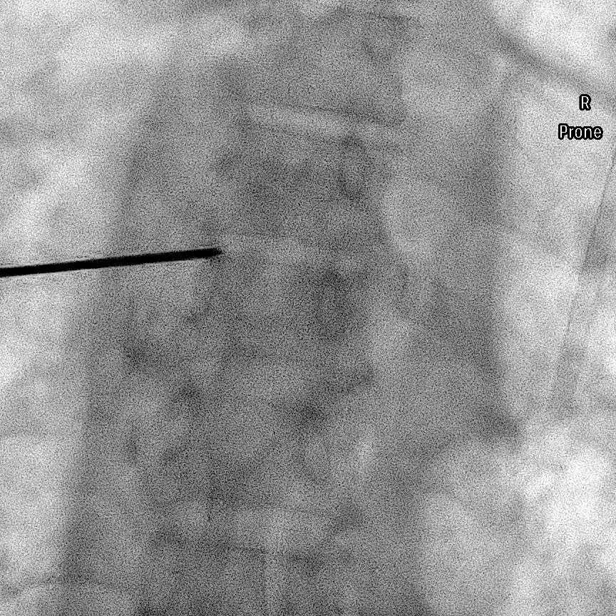
[frame 12/14]
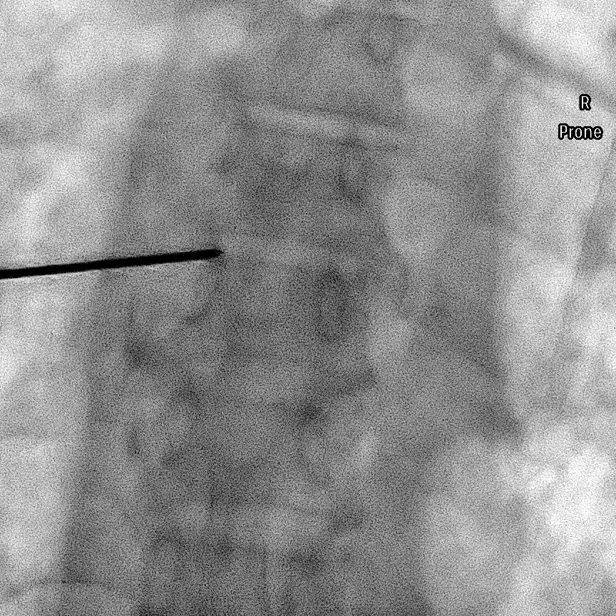

[15 of 20 positions shown; findings below may reference images not displayed]

chest CT-08/10/2020

MEDICATIONS:
None

ANESTHESIA/SEDATION:
Moderate (conscious) sedation was employed during this procedure. A
total of Fentanyl 50 mcg Versed 1 mg was administered intravenously.

Moderate Sedation Time: 20 minutes. The patient's level of
consciousness and vital signs were monitored continuously by
radiology nursing throughout the procedure under my direct
supervision.

CONTRAST:  None

FLUOROSCOPY TIME:  1 minute, 48 seconds (29.3 mGy)

COMPLICATIONS:
None immediate.

PROCEDURE:
Informed written consent was obtained from the patient after a
discussion of the risks, benefits and alternatives to treatment. A
timeout was performed prior to the initiation of the procedure. The
patient was positioned prone on the fluoroscopy table. The T7-T8
intervertebral disc space was marked fluoroscopically.

Utilizing an oblique, left-sided approach, a 17 gauge trocar was
advanced into the targeted intervertebral disc. Needle positioning
within the left posterolateral aspect of the T7-T8 intervertebral
disc was confirmed with the fluoroscopic imaging in both the
anterior and lateral projections. Multiple spot fluoroscopic images
were saved for procedural documentation purposes.

With needle positioning confirmed attempts were made to perform
aspiration, however no fluid was able to be aspirated. As such
approximately 2 cc saline was administered and subsequently
aspirated yielding approximately 1 cc blood tinged fluid. All
aspirated fluid was capped and sent to the laboratory analysis.

The needle was removed and superficial hemostasis was achieved with
manual compression. A dressing was applied. The patient tolerated
the procedure well without immediate postprocedural complication.
IMPRESSION: Technically successful fluoroscopic guided T7-T8 disc lavage
aspiration.

## 2020-12-24 ENCOUNTER — Ambulatory Visit (INDEPENDENT_AMBULATORY_CARE_PROVIDER_SITE_OTHER): Payer: Medicare HMO | Admitting: Vascular Surgery

## 2021-01-10 ENCOUNTER — Ambulatory Visit (INDEPENDENT_AMBULATORY_CARE_PROVIDER_SITE_OTHER): Payer: Medicare HMO | Admitting: Vascular Surgery

## 2021-01-17 ENCOUNTER — Encounter (INDEPENDENT_AMBULATORY_CARE_PROVIDER_SITE_OTHER): Payer: Self-pay | Admitting: Vascular Surgery

## 2021-01-17 ENCOUNTER — Ambulatory Visit (INDEPENDENT_AMBULATORY_CARE_PROVIDER_SITE_OTHER): Payer: Medicare Other | Admitting: Vascular Surgery

## 2021-01-26 IMAGING — US US EXTREM  UP VENOUS*L*
1 series · 13 of 24 positions shown · non-contrast
Comparison: None.

CLINICAL DATA: Upper extremity edema. History of end-stage renal
disease with indwelling tunneled left-sided dialysis catheter.
Status post prior stenting of the left subclavian vein and
angioplasty of the left brachiocephalic vein.



[Series 1: us venous img upper uni left (dvt) · portal-venous · 35 acquisitions, 13 frames shown]
[im 1/35]
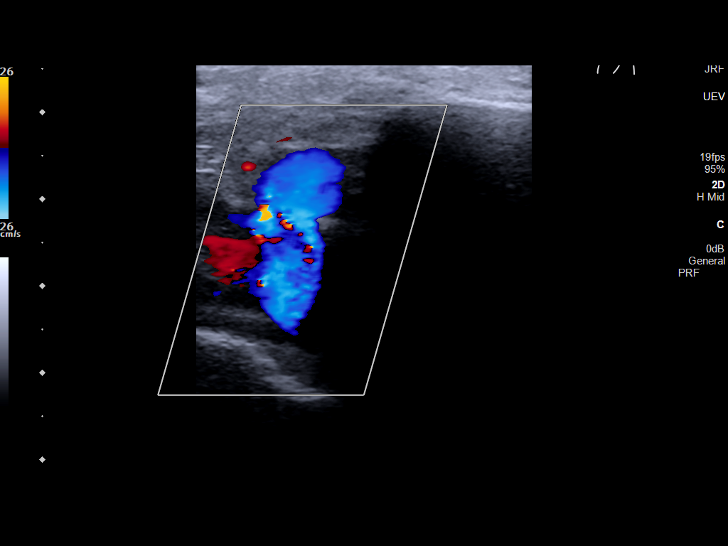
[im 3/35]
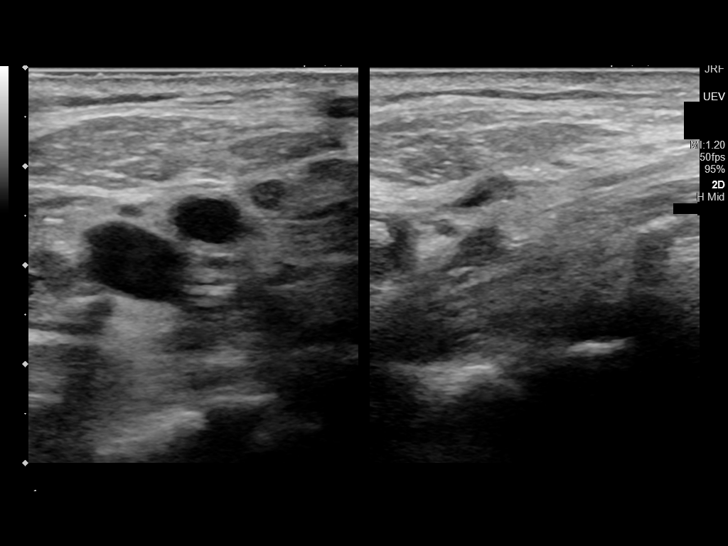
[im 6/35]
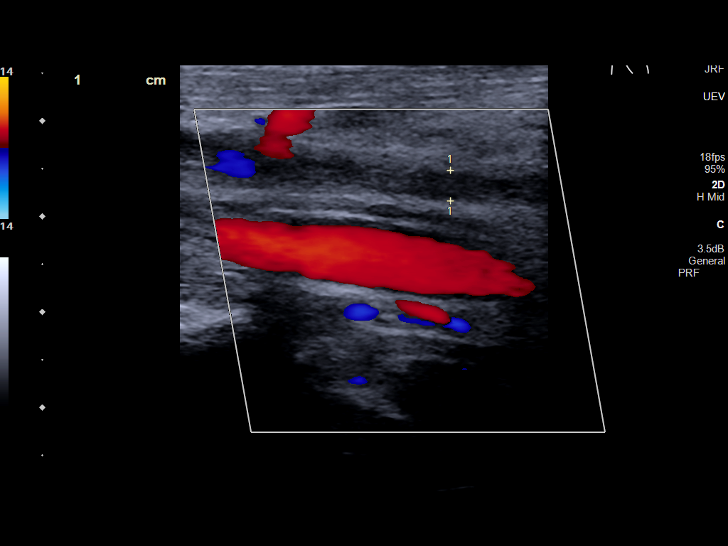
[im 9/35]
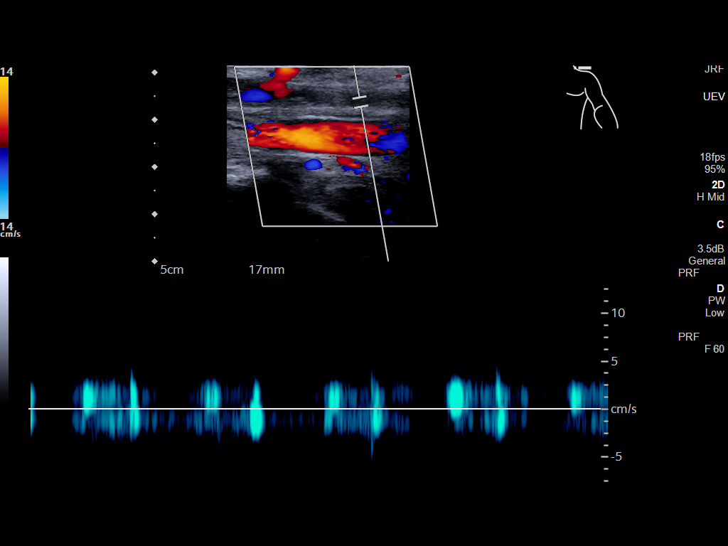
[im 12/35]
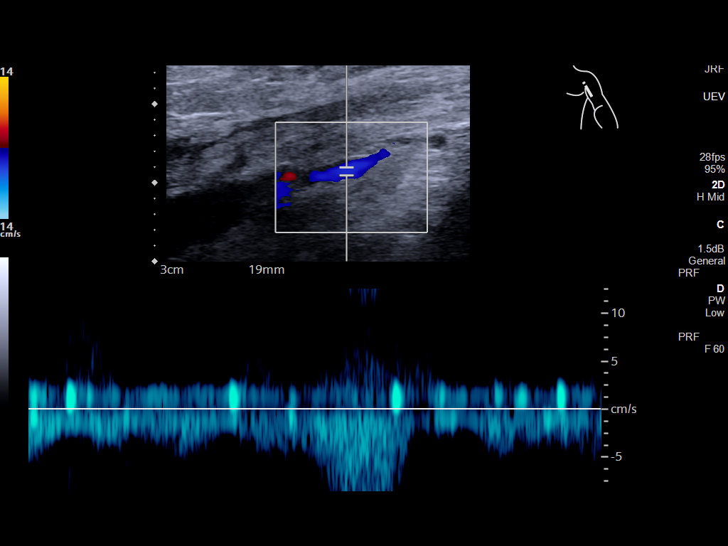
[im 15/35]
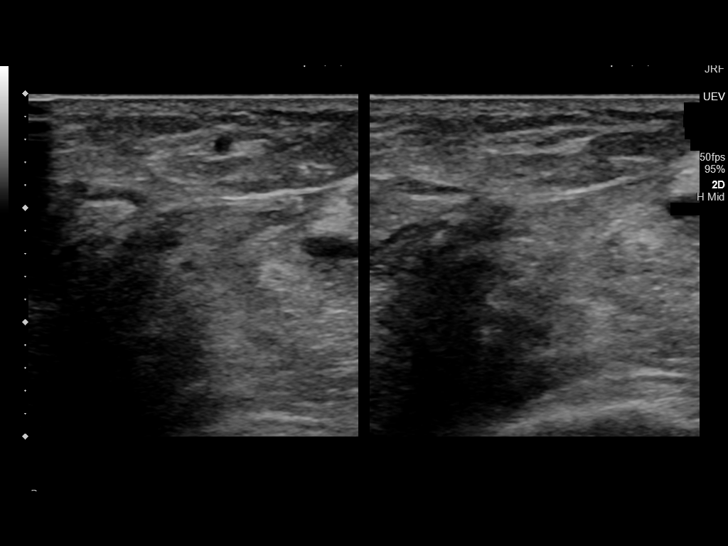
[im 18/35]
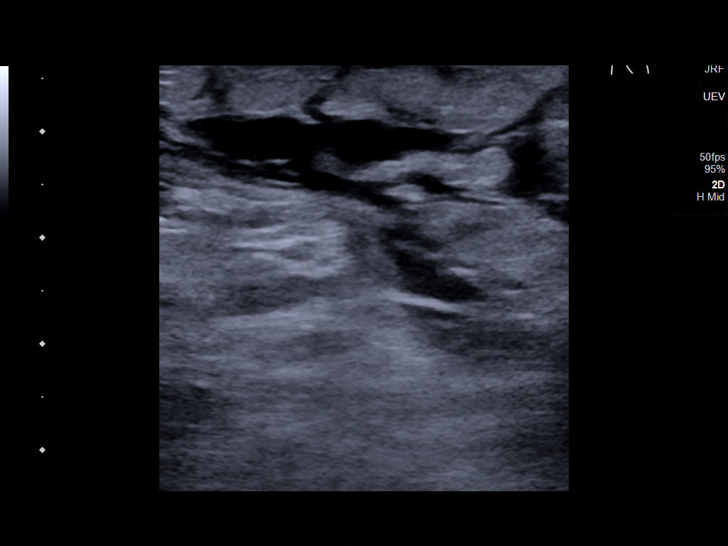
[im 20/35]
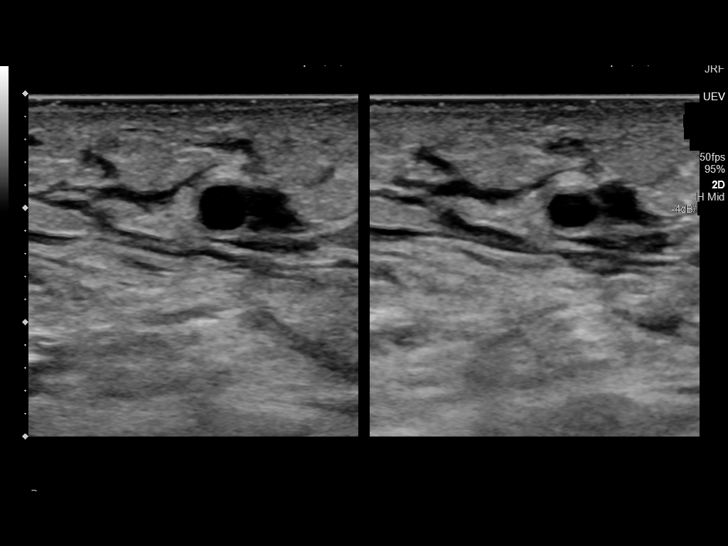
[im 23/35]
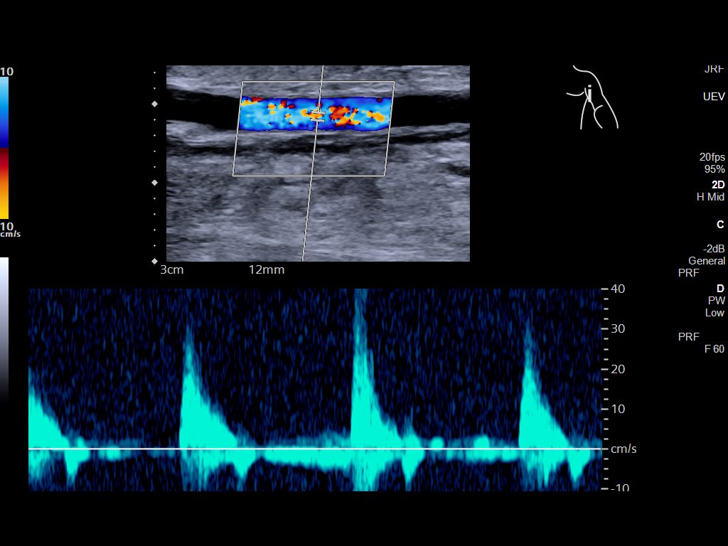
[im 26/35]
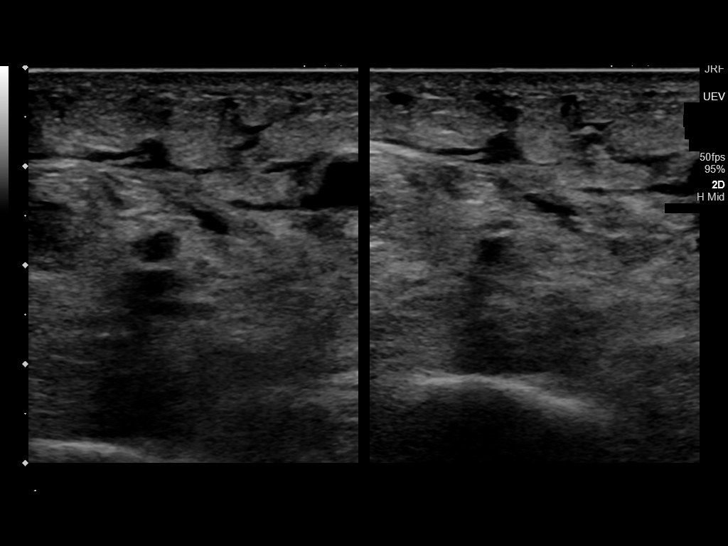
[im 29/35]
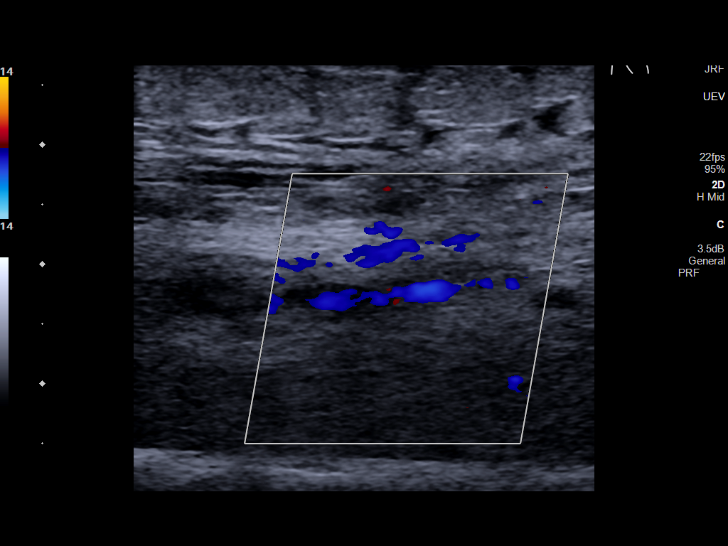
[im 32/35]
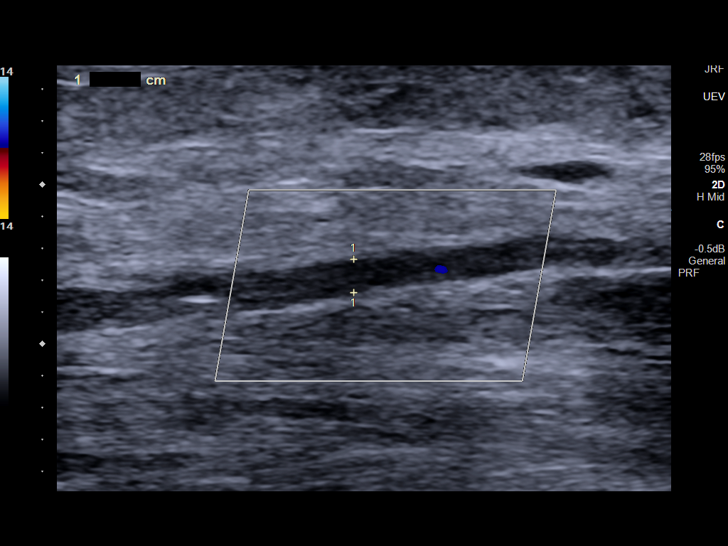
[im 35/35]
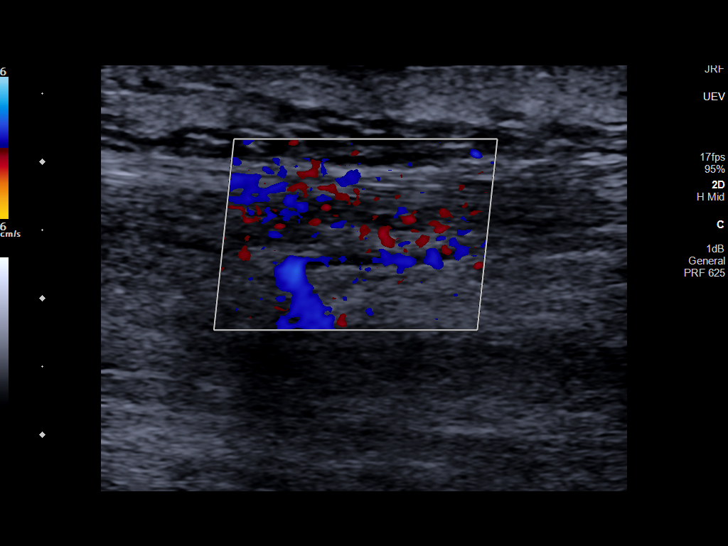

[13 of 24 positions shown; findings below may reference images not displayed]

FINDINGS: Contralateral Subclavian Vein: Respiratory phasicity is normal and
symmetric with the symptomatic side. No evidence of thrombus. Normal
compressibility.

Internal Jugular Vein: No evidence of thrombus. Normal
compressibility, respiratory phasicity and response to augmentation.

Subclavian Vein: Previously stented left subclavian vein is likely
chronically occluded.

Axillary Vein: No evidence of thrombus. Normal compressibility,
respiratory phasicity and response to augmentation.

Cephalic Vein: No evidence of thrombus. Normal compressibility,
respiratory phasicity and response to augmentation.

Basilic Vein: No evidence of thrombus. Normal compressibility,
respiratory phasicity and response to augmentation.

Brachial Veins: No evidence of thrombus. Normal compressibility,
respiratory phasicity and response to augmentation.

Radial Veins: Segment of the left radial vein is occluded. Based on
small caliber of the vein and echogenic appearance of thrombus, this
is felt to be likely chronic thrombus.

Ulnar Veins: No evidence of thrombus. Normal compressibility,
respiratory phasicity and response to augmentation.

Venous Reflux:  None visualized.

Other Findings: No evidence of superficial thrombophlebitis or
abnormal fluid collection.
IMPRESSION: 1. Chronic occlusion of the left subclavian vein which has been
previously stented.
2. Left radial vein occlusion in the forearm. Based on small caliber
of the vein and echogenic appearance of thrombus, this is felt to be
likely chronic thrombus.

## 2021-01-31 ENCOUNTER — Encounter (INDEPENDENT_AMBULATORY_CARE_PROVIDER_SITE_OTHER): Payer: Self-pay | Admitting: Vascular Surgery

## 2021-01-31 ENCOUNTER — Ambulatory Visit (INDEPENDENT_AMBULATORY_CARE_PROVIDER_SITE_OTHER): Payer: Medicare Other | Admitting: Vascular Surgery

## 2021-01-31 ENCOUNTER — Other Ambulatory Visit: Payer: Self-pay

## 2021-01-31 VITALS — BP 150/82 | HR 65 | Resp 16 | Wt 177.6 lb

## 2021-01-31 DIAGNOSIS — T829XXS Unspecified complication of cardiac and vascular prosthetic device, implant and graft, sequela: Secondary | ICD-10-CM | POA: Diagnosis not present

## 2021-01-31 DIAGNOSIS — I10 Essential (primary) hypertension: Secondary | ICD-10-CM

## 2021-01-31 DIAGNOSIS — N186 End stage renal disease: Secondary | ICD-10-CM | POA: Diagnosis not present

## 2021-01-31 DIAGNOSIS — I2510 Atherosclerotic heart disease of native coronary artery without angina pectoris: Secondary | ICD-10-CM

## 2021-01-31 DIAGNOSIS — I872 Venous insufficiency (chronic) (peripheral): Secondary | ICD-10-CM

## 2021-01-31 DIAGNOSIS — Z992 Dependence on renal dialysis: Secondary | ICD-10-CM

## 2021-01-31 NOTE — Progress Notes (Signed)
MRN : [id number]  [name] is a 60 y.o. (10/30/1960) female who presents with chief complaint of  Chief Complaint  Patient presents with  . Follow-up    [facility] 2wk post ue angio  .  History of Present Illness:    The patient is seen for evaluation of dialysis access.  The patient has a history of multiple failed accesses.  There have been accesses in both arms.    Current access is via a catheter which is functioning adequately.    The patient denies amaurosis fugax or recent TIA symptoms. There are no recent neurological changes noted. The patient denies claudication symptoms or rest pain symptoms. The patient denies history of DVT, PE or superficial thrombophlebitis. The patient denies recent episodes of angina or shortness of breath.    Current Meds  Medication Sig  . albuterol (PROVENTIL) (2.5 MG/3ML) 0.083% nebulizer solution Take 2.5 mg by nebulization every 4 (four) hours as needed for wheezing or shortness of breath.  Marland Kitchen albuterol (VENTOLIN HFA) 108 (90 Base) MCG/ACT inhaler Inhale 2 puffs into the lungs every 4 (four) hours as needed for wheezing or shortness of breath.  Marland Kitchen alum & mag hydroxide-simeth (MAALOX/MYLANTA) 200-200-20 MG/5ML suspension Take 15 mLs by mouth every 6 (six) hours as needed for indigestion or heartburn.  Marland Kitchen amLODipine (NORVASC) 10 MG tablet Take 1 tablet (10 mg total) by mouth daily.  Marland Kitchen ammonium lactate (LAC-HYDRIN) 12 % lotion Apply 1 application topically 2 (two) times daily as needed for dry skin.   . Ascorbic Acid (VITA-C PO) Take 1 tablet by mouth daily.  Marland Kitchen aspirin EC 81 MG tablet Take 81 mg by mouth daily.  Marland Kitchen atorvastatin (LIPITOR) 20 MG tablet Take 20 mg by mouth every evening.   . calcium carbonate (TUMS - DOSED IN MG ELEMENTAL CALCIUM) 500 MG chewable tablet Chew 2.5 tablets (500 mg of elemental calcium total) by mouth every 6 (six) hours as needed for indigestion.  . Cholecalciferol (VITAMIN D3) 25 MCG (1000 UT) CAPS Take 1,000 Units by mouth  daily.   . cholestyramine light (PREVALITE) 4 g packet Take 4 g by mouth 2 (two) times daily.  Marland Kitchen CREON 12000 units CPEP capsule Take 36,000 Units by mouth 3 (three) times daily before meals.   . cyanocobalamin 1000 MCG tablet Take 1,000 mcg by mouth daily.   . diphenhydrAMINE (BENADRYL) 25 MG tablet Take 25 mg by mouth every 6 (six) hours as needed for itching.  Marland Kitchen epoetin alfa (EPOGEN) 10000 UNIT/ML injection Inject 1 mL (10,000 Units total) into the vein every Monday, Wednesday, and Friday with hemodialysis.  . Ferrous Sulfate (IRON) 325 (65 Fe) MG TABS Take 325 mg by mouth daily.   . furosemide (LASIX) 80 MG tablet Take 1 tablet by mouth daily.  Marland Kitchen gabapentin (NEURONTIN) 100 MG capsule Take 100 mg by mouth every evening.   . hydrALAZINE (APRESOLINE) 50 MG tablet Take 1 tablet (50 mg total) by mouth every 8 (eight) hours.  Marland Kitchen HYDROcodone-acetaminophen (NORCO/VICODIN) 5-325 MG tablet Take 1 tablet by mouth 3 (three) times daily as needed for moderate pain or severe pain.  . hydrOXYzine (ATARAX/VISTARIL) 25 MG tablet Take 25 mg by mouth daily.   . meclizine (ANTIVERT) 25 MG tablet Take 25 mg by mouth 4 (four) times daily as needed for dizziness.  . megestrol (MEGACE) 400 MG/10ML suspension Take 10 mLs (400 mg total) by mouth daily.  . multivitamin (RENA-VIT) TABS tablet Take 1 tablet by mouth daily.   Marland Kitchen  mupirocin ointment (BACTROBAN) 2 % Apply 1 application topically daily as needed (leg rash).   . nitroGLYCERIN (NITROSTAT) 0.4 MG SL tablet Place 0.4 mg under the tongue every 5 (five) minutes as needed for chest pain.   . Nutritional Supplements (FEEDING SUPPLEMENT, NEPRO CARB STEADY,) LIQD Take 237 mLs by mouth 2 (two) times daily between meals.  . Omega-3 300 MG CAPS Take 300 mg by mouth 2 (two) times daily. Every other day opposite omega XL  . ondansetron (ZOFRAN) 4 MG tablet Take 1 tablet (4 mg total) by mouth every 8 (eight) hours as needed for nausea or vomiting.  . pantoprazole (PROTONIX) 40  MG tablet Take 40 mg by mouth daily.  . polyethylene glycol (MIRALAX / GLYCOLAX) 17 g packet Take 17 g by mouth daily.  . Probiotic Product (PROBIOTIC PO) Take 1 capsule by mouth daily.  . sevelamer carbonate (RENVELA) 800 MG tablet Take 800 mg by mouth 4 (four) times daily. (at meals and with a snack)    Past Medical History:  Diagnosis Date  . Anemia   . Anginal pain ([place])   . Anxiety   . Arthritis   . Asthma   . Broken wrist   . Bronchitis   . chronic diastolic CHF 4/76/5465  . Chronic kidney disease   . COPD (chronic obstructive pulmonary disease) ([place])   . Coronary artery disease    a. cath 2013: stenting to RCA (report not available); b. cath 2014: LM nl, pLAD 40%, mLAD nl, ost LCx 40%, mid LCx nl, pRCA 30% @ site of prior stent, mRCA 50%  . Depression   . Diabetes mellitus ([place])   . Diabetes mellitus without complication ([place])   . Diabetic neuropathy ([place])   . dialysis 2006  . Diverticulosis   . Dizziness   . Dyspnea   . Elevated lipids   . Environmental and seasonal allergies   . ESRD (end stage renal disease) on dialysis ([place])    M-W-F  . Gastroparesis   . GERD (gastroesophageal reflux disease)   . Headache   . History of anemia due to chronic kidney disease   . History of hiatal hernia   . HOH (hard of hearing)   . Hx of pancreatitis 2015  . Hypertension   . Lower extremity edema   . Mitral regurgitation    a. echo 10/2013: EF 62%, noWMA, mildly dilated LA, mild to mod MR/TR, GR1DD  . Myocardial infarction ([place])   . Orthopnea   . Parathyroid abnormality ([place])   . Peripheral arterial disease ([place])   . Pneumonia   . Renal cancer ([place])   . Renal insufficiency    Pt is on dialysis on M,W + F.  . Wheezing     Past Surgical History:  Procedure Laterality Date  . A/V SHUNTOGRAM Left 01/20/2018   Procedure: A/V SHUNTOGRAM;  Surgeon: Algernon Huxley, MD;  Location: [place] [facility];  Service: Cardiovascular;  Laterality: Left;  . ABDOMINAL HYSTERECTOMY  1992   . AMPUTATION TOE Left 10/02/2017   Procedure: AMPUTATION TOE-LEFT GREAT TOE;  Surgeon: Albertine Patricia, DPM;  Location: [facility];  Service: Podiatry;  Laterality: Left;  . APPENDECTOMY    . APPLICATION OF WOUND VAC N/A 11/25/2019   Procedure: APPLICATION OF WOUND VAC;  Surgeon: Katha Cabal, MD;  Location: [facility];  Service: Vascular;  Laterality: N/A;  . ARTERY BIOPSY Right 10/11/2018   Procedure: BIOPSY TEMPORAL ARTERY;  Surgeon: Vickie Epley, MD;  Location: [facility];  Service: General;  Laterality: Right;  . CARDIAC CATHETERIZATION Left 07/26/2015   Procedure: Left Heart Cath and Coronary Angiography;  Surgeon: Dionisio David, MD;  Location: [place] [facility];  Service: Cardiovascular;  Laterality: Left;  . CATARACT EXTRACTION W/ INTRAOCULAR LENS IMPLANT Right   . CATARACT EXTRACTION W/PHACO Left 03/10/2017   Procedure: CATARACT EXTRACTION PHACO AND INTRAOCULAR LENS PLACEMENT (IOC);  Surgeon: Birder Robson, MD;  Location: [facility];  Service: Ophthalmology;  Laterality: Left;  [place] 00:51.9 AP% 14.2 CDE 7.39 fluid pack lot # [id number]  . CENTRAL LINE INSERTION Right 11/11/2019   Procedure: CENTRAL LINE INSERTION;  Surgeon: Katha Cabal, MD;  Location: [facility];  Service: Vascular;  Laterality: Right;  . CENTRAL LINE INSERTION  11/25/2019   Procedure: CENTRAL LINE INSERTION;  Surgeon: Katha Cabal, MD;  Location: [facility];  Service: Vascular;;  . CHOLECYSTECTOMY    . COLONOSCOPY WITH PROPOFOL N/A 08/12/2016   Procedure: COLONOSCOPY WITH PROPOFOL;  Surgeon: Lollie Sails, MD;  Location: [facility] ENDOSCOPY;  Service: Endoscopy;  Laterality: N/A;  . DIALYSIS FISTULA CREATION Left    upper arm  . dialysis grafts    . DIALYSIS/PERMA CATHETER INSERTION N/A 11/14/2019   Procedure: DIALYSIS/PERMA CATHETER INSERTION;  Surgeon: Algernon Huxley, MD;  Location: [place] [facility];  Service: Cardiovascular;  Laterality: N/A;  . DIALYSIS/PERMA CATHETER INSERTION N/A 02/03/2020    Procedure: DIALYSIS/PERMA CATHETER INSERTION;  Surgeon: Katha Cabal, MD;  Location: [place] [facility];  Service: Cardiovascular;  Laterality: N/A;  . DIALYSIS/PERMA CATHETER INSERTION N/A 06/19/2020   Procedure: DIALYSIS/PERMA CATHETER INSERTION;  Surgeon: Katha Cabal, MD;  Location: [place] [facility];  Service: Cardiovascular;  Laterality: N/A;  . DIALYSIS/PERMA CATHETER REMOVAL N/A 05/25/2020   Procedure: DIALYSIS/PERMA CATHETER REMOVAL;  Surgeon: Katha Cabal, MD;  Location: [place] [facility];  Service: Cardiovascular;  Laterality: N/A;  . ESOPHAGOGASTRODUODENOSCOPY N/A 03/08/2015   Procedure: ESOPHAGOGASTRODUODENOSCOPY (EGD);  Surgeon: Manya Silvas, MD;  Location: [facility]COPY;  Service: Endoscopy;  Laterality: N/A;  . ESOPHAGOGASTRODUODENOSCOPY (EGD) WITH PROPOFOL N/A 03/18/2016   Procedure: ESOPHAGOGASTRODUODENOSCOPY (EGD) WITH PROPOFOL;  Surgeon: [name], MD;  Location: [facility] ENDOSCOPY;  Service: Endoscopy;  Laterality: N/A;  . EYE SURGERY Right 2018  . FECAL TRANSPLANT N/A 08/23/2015   Procedure: FECAL TRANSPLANT;  Surgeon: Manya Silvas, MD;  Location: [facility] ENDOSCOPY;  Service: Endoscopy;  Laterality: N/A;  . HAND SURGERY Bilateral   . HEMATOMA EVACUATION Left 11/25/2019   Procedure: EVACUATION HEMATOMA;  Surgeon: Katha Cabal, MD;  Location: [facility];  Service: Vascular;  Laterality: Left;  . I & D EXTREMITY Left 11/25/2019   Procedure: IRRIGATION AND DEBRIDEMENT EXTREMITY;  Surgeon: Katha Cabal, MD;  Location: [facility];  Service: Vascular;  Laterality: Left;  . IR FLUORO GUIDE CV LINE RIGHT  04/06/2020  . IR INJECT/THERA/INC NEEDLE/CATH/PLC EPI/CERV/THOR [facility]  08/13/2020  . IR RADIOLOGIST EVAL & MGMT  07/28/2019  . IR RADIOLOGIST EVAL & MGMT  08/11/2019  . LIGATION OF ARTERIOVENOUS  FISTULA Left 11/11/2019  . LIGATION OF ARTERIOVENOUS  FISTULA Left 11/11/2019   Procedure: LIGATION OF ARTERIOVENOUS  FISTULA;  Surgeon: Katha Cabal, MD;   Location: [facility];  Service: Vascular;  Laterality: Left;  . LIGATIONS OF HERO GRAFT Right 06/13/2020   Procedure: LIGATION / REMOVAL OF RIGHT HERO GRAFT;  Surgeon: Katha Cabal, MD;  Location: [facility];  Service: Vascular;  Laterality: Right;  . PERIPHERAL VASCULAR CATHETERIZATION N/A 12/20/2015  Procedure: Thrombectomy of dialysis access versus permcath placement;  Surgeon: Algernon Huxley, MD;  Location: [place] [facility];  Service: Cardiovascular;  Laterality: N/A;  . PERIPHERAL VASCULAR CATHETERIZATION N/A 12/20/2015   Procedure: A/V Shunt Intervention;  Surgeon: Algernon Huxley, MD;  Location: [place] [facility];  Service: Cardiovascular;  Laterality: N/A;  . PERIPHERAL VASCULAR CATHETERIZATION N/A 12/20/2015   Procedure: A/V Shuntogram/Fistulagram;  Surgeon: Algernon Huxley, MD;  Location: [place] [facility];  Service: Cardiovascular;  Laterality: N/A;  . PERIPHERAL VASCULAR CATHETERIZATION N/A 01/02/2016   Procedure: A/V Shuntogram/Fistulagram;  Surgeon: Algernon Huxley, MD;  Location: [place] [facility];  Service: Cardiovascular;  Laterality: N/A;  . PERIPHERAL VASCULAR CATHETERIZATION N/A 01/02/2016   Procedure: A/V Shunt Intervention;  Surgeon: Algernon Huxley, MD;  Location: [place] [facility];  Service: Cardiovascular;  Laterality: N/A;  . TEE WITHOUT CARDIOVERSION N/A 06/11/2020   Procedure: TRANSESOPHAGEAL ECHOCARDIOGRAM (TEE);  Surgeon: Teodoro Spray, MD;  Location: [facility];  Service: Cardiovascular;  Laterality: N/A;  . UPPER EXTREMITY ANGIOGRAPHY Bilateral 11/27/2020   Procedure: UPPER EXTREMITY ANGIOGRAPHY;  Surgeon: Katha Cabal, MD;  Location: [place] [facility];  Service: Cardiovascular;  Laterality: Bilateral;  . UPPER EXTREMITY VENOGRAPHY Right 01/18/2020   Procedure: UPPER EXTREMITY VENOGRAPHY;  Surgeon: Katha Cabal, MD;  Location: [place] [facility];  Service: Cardiovascular;  Laterality: Right;  . UPPER EXTREMITY VENOGRAPHY Bilateral 11/27/2020   Procedure: UPPER  EXTREMITY VENOGRAPHY;  Surgeon: Katha Cabal, MD;  Location: [place] [facility];  Service: Cardiovascular;  Laterality: Bilateral;  . VASCULAR ACCESS DEVICE INSERTION Right 04/13/2020   Procedure: INSERTION OF HERO VASCULAR ACCESS DEVICE (GRAFT);  Surgeon: Katha Cabal, MD;  Location: [facility];  Service: Vascular;  Laterality: Right;    Social History Social History   Tobacco Use  . Smoking status: Never Smoker  . Smokeless tobacco: Never Used  Vaping Use  . Vaping Use: Never used  Substance Use Topics  . Alcohol use: Not Currently    Comment: glass wine week per pt  . Drug use: Yes    Types: Marijuana    Comment: once a day    Family History Family History  Problem Relation Age of Onset  . Kidney disease Mother   . Diabetes Mother   . Cancer Father   . Kidney disease Sister     Allergies  Allergen Reactions  . Ace Inhibitors Swelling and Anaphylaxis  . Ativan [Lorazepam] Other (See Comments)    Reaction:Hallucinations and headaches  . Compazine [Prochlorperazine Edisylate] Anaphylaxis, Nausea And Vomiting and Other (See Comments)    Other reaction(s): dystonia from this vs. Reglan, 23 Jul - patient relates that she takes promethazine frequently with no problems  . Sumatriptan Succinate Other (See Comments)    Other reaction(s): delirium and hallucinations per [facility] records  . Zofran [Ondansetron] Nausea And Vomiting    Per pt. she is allergic to zofran or will experience adverse reaction like hallucination   . Losartan Nausea Only  . Prochlorperazine Other (See Comments)    Reaction:  Unknown . Patient does not remember reaction but she does have vertigo and anxiety along with n and v at times. Could be used to treat any of these   . Reglan [Metoclopramide] Other (See Comments)    Per patient her Dr. Evelina Bucy her off it   . Scopolamine Other (See Comments)    Dizziness, also has vertigo already  . Tape Rash  Plastic tape causes rash  . Tapentadol Rash   . Ultrasound Gel Itching    Patient states the Ultrasound gel caused itching while in skin contact and resolved when wiped away.     REVIEW OF SYSTEMS (Negative unless checked)  Constitutional: [] Weight loss  [] Fever  [] Chills Cardiac: [] Chest pain   [] Chest pressure   [] Palpitations   [] Shortness of breath when laying flat   [] Shortness of breath with exertion. Vascular:  [] Pain in legs with walking   [] Pain in legs at rest  [] History of DVT   [] Phlebitis   [x] Swelling in legs   [] Varicose veins   [] Non-healing ulcers Pulmonary:   [] Uses home oxygen   [] Productive cough   [] Hemoptysis   [] Wheeze  [] COPD   [] Asthma Neurologic:  [] Dizziness   [] Seizures   [] History of stroke   [] History of TIA  [] Aphasia   [] Vissual changes   [] Weakness or numbness in arm   [x] Weakness or numbness in leg Musculoskeletal:   [] Joint swelling   [x] Joint pain   [] Low back pain Hematologic:  [] Easy bruising  [] Easy bleeding   [] Hypercoagulable state   [] Anemic Gastrointestinal:  [] Diarrhea   [] Vomiting  [] Gastroesophageal reflux/heartburn   [] Difficulty swallowing. Genitourinary:  [x] Chronic kidney disease   [] Difficult urination  [] Frequent urination   [] Blood in urine Skin:  [] Rashes   [] Ulcers  Psychological:  [] History of anxiety   []  History of major depression.  Physical Examination  Vitals:   01/31/21 1355  BP: (!) 150/82  Pulse: 65  Resp: 16  Weight: 177 lb 9.6 oz (80.6 kg)   Body mass index is 29.55 kg/m. Gen: WD/WN, NAD Head: [address]/AT, No temporalis wasting.  Ear/Nose/Throat: Hearing grossly intact, nares w/o erythema or drainage Eyes: PER, EOMI, sclera nonicteric.  Neck: Supple, no large masses.   Pulmonary:  Good air movement, no audible wheezing bilaterally, no use of accessory muscles.  Cardiac: RRR, no JVD Vascular: right IJ catheter CD&I; prior grafts noted bilateral arms. Vessel Right Left  Radial Palpable Palpable  Ulnar Not Palpable Not Palpable  Brachial Palpable Palpable   Gastrointestinal: Non-distended. No guarding/no peritoneal signs.  Musculoskeletal: M/S 5/5 throughout.  No deformity or atrophy.  Neurologic: CN 2-12 intact. Symmetrical.  Speech is fluent. Motor exam as listed above. Psychiatric: Judgment intact, Mood & affect appropriate for pt's clinical situation. Dermatologic: No rashes or ulcers noted.  No changes consistent with cellulitis.   CBC Lab Results  Component Value Date   WBC 5.8 12/14/2020   HGB 8.3 (L) 12/14/2020   HCT 24.6 (L) 12/14/2020   MCV 82.8 12/14/2020   PLT 115 (L) 12/14/2020    BMET    Component Value Date/Time   NA 138 12/14/2020 1100   NA 135 (L) 09/15/2014 0948   K 4.4 12/14/2020 1100   K 4.8 09/15/2014 0948   CL 88 (L) 12/14/2020 1100   CL 99 09/15/2014 0948   CO2 38 (H) 12/14/2020 1100   CO2 26 09/15/2014 0948   GLUCOSE 92 12/14/2020 1100   GLUCOSE 118 (H) 09/15/2014 0948   BUN 18 12/14/2020 1100   BUN 19 (H) 09/15/2014 0948   CREATININE 3.96 (H) 12/14/2020 1100   CREATININE 6.79 (H) 09/15/2014 0948   CALCIUM 8.4 (L) 12/14/2020 1100   CALCIUM 8.3 (L) 09/15/2014 0948   GFRNONAA 12 (L) 12/14/2020 1100   GFRNONAA 7 (L) 09/15/2014 0948   GFRNONAA 6 (L) 05/31/2014 0432   GFRAA 11 (L) 06/25/2020 0641   GFRAA 8 (L) 09/15/2014 6301  GFRAA 7 (L) 05/31/2014 0432   CrCl cannot be calculated (Patient's most recent lab result is older than the maximum 21 days allowed.).  COAG Lab Results  Component Value Date   INR 1.2 09/17/2020   INR 1.3 (H) 06/13/2020   INR 1.0 04/13/2020    Radiology No results found.   Assessment/Plan 1. Complication of vascular access for dialysis, sequela Recommend:  At this time the patient does not have appropriate extremity access for dialysis  Patient should have a right brachial axillary graft created after a duplex ultrasound confirms patency of the angioplasty site in the brachial artery.  The risks, benefits and alternative therapies were reviewed in detail with  the patient.  All questions were answered.  The patient agrees to proceed with surgery.   2. End-stage renal disease on hemodialysis ([place]) At the present time the patient has catheter based dialysis access.  Continue hemodialysis as ordered without interruption.  Avoid nephrotoxic medications and dehydration.  Further plans per nephrology  3. Benign essential HTN Continue antihypertensive medications as already ordered, these medications have been reviewed and there are no changes at this time.   4. Coronary artery disease involving native coronary artery of native heart, unspecified whether angina present Continue cardiac and antihypertensive medications as already ordered and reviewed, no changes at this time.  Continue statin as ordered and reviewed, no changes at this time  Nitrates PRN for chest pain   5. Chronic venous insufficiency No surgery or intervention at this point in time.    I have had a long discussion with the patient regarding venous insufficiency and why it  causes symptoms. I have discussed with the patient the chronic skin changes that accompany venous insufficiency and the long term sequela such as infection and ulceration.  Patient will begin wearing graduated compression stockings class 1 (20-30 mmHg) or compression wraps on a daily basis a prescription was given. The patient will put the stockings on first thing in the morning and removing them in the evening. The patient is instructed specifically not to sleep in the stockings.    In addition, behavioral modification including several periods of elevation of the lower extremities during the day will be continued. I have demonstrated that proper elevation is a position with the ankles at heart level.  The patient is instructed to begin routine exercise, especially walking on a daily basis    Hortencia Pilar, MD  01/31/2021 2:28 PM

## 2021-02-13 ENCOUNTER — Other Ambulatory Visit (INDEPENDENT_AMBULATORY_CARE_PROVIDER_SITE_OTHER): Payer: Self-pay | Admitting: Vascular Surgery

## 2021-02-13 DIAGNOSIS — Z9862 Peripheral vascular angioplasty status: Secondary | ICD-10-CM

## 2021-02-13 DIAGNOSIS — I70208 Unspecified atherosclerosis of native arteries of extremities, other extremity: Secondary | ICD-10-CM

## 2021-02-13 DIAGNOSIS — T829XXS Unspecified complication of cardiac and vascular prosthetic device, implant and graft, sequela: Secondary | ICD-10-CM

## 2021-02-14 ENCOUNTER — Other Ambulatory Visit: Payer: Self-pay

## 2021-02-14 ENCOUNTER — Emergency Department
Admission: EM | Admit: 2021-02-14 | Discharge: 2021-02-14 | Disposition: A | Discharge status: Home / Self Care | Payer: Medicare Other | Attending: Emergency Medicine | Admitting: Emergency Medicine

## 2021-02-14 ENCOUNTER — Emergency Department: Payer: Medicare Other

## 2021-02-14 ENCOUNTER — Ambulatory Visit (INDEPENDENT_AMBULATORY_CARE_PROVIDER_SITE_OTHER): Payer: Medicare Other

## 2021-02-14 ENCOUNTER — Encounter (INDEPENDENT_AMBULATORY_CARE_PROVIDER_SITE_OTHER): Payer: Self-pay

## 2021-02-14 DIAGNOSIS — Z992 Dependence on renal dialysis: Secondary | ICD-10-CM | POA: Diagnosis not present

## 2021-02-14 DIAGNOSIS — M791 Myalgia, unspecified site: Secondary | ICD-10-CM | POA: Diagnosis not present

## 2021-02-14 DIAGNOSIS — I509 Heart failure, unspecified: Secondary | ICD-10-CM | POA: Insufficient documentation

## 2021-02-14 DIAGNOSIS — R079 Chest pain, unspecified: Secondary | ICD-10-CM | POA: Diagnosis present

## 2021-02-14 DIAGNOSIS — R110 Nausea: Secondary | ICD-10-CM | POA: Insufficient documentation

## 2021-02-14 DIAGNOSIS — R0602 Shortness of breath: Secondary | ICD-10-CM | POA: Diagnosis not present

## 2021-02-14 DIAGNOSIS — R519 Headache, unspecified: Secondary | ICD-10-CM | POA: Diagnosis not present

## 2021-02-14 DIAGNOSIS — Z5321 Procedure and treatment not carried out due to patient leaving prior to being seen by health care provider: Secondary | ICD-10-CM | POA: Insufficient documentation

## 2021-02-14 LAB — PROTIME-INR
INR: 1.3 — ABNORMAL HIGH (ref 0.8–1.2)
Prothrombin Time: 16.5 seconds — ABNORMAL HIGH (ref 11.4–15.2)

## 2021-02-14 LAB — CBC
HCT: 29.8 % — ABNORMAL LOW (ref 36.0–46.0)
Hemoglobin: 10 g/dL — ABNORMAL LOW (ref 12.0–15.0)
MCH: 27.9 pg (ref 26.0–34.0)
MCHC: 33.6 g/dL (ref 30.0–36.0)
MCV: 83 fL (ref 80.0–100.0)
Platelets: 119 10*3/uL — ABNORMAL LOW (ref 150–400)
RBC: 3.59 MIL/uL — ABNORMAL LOW (ref 3.87–5.11)
RDW: 18.3 % — ABNORMAL HIGH (ref 11.5–15.5)
WBC: 3.7 10*3/uL — ABNORMAL LOW (ref 4.0–10.5)
nRBC: 0 % (ref 0.0–0.2)

## 2021-02-14 LAB — BASIC METABOLIC PANEL
Anion gap: 11 (ref 5–15)
BUN: 10 mg/dL (ref 8–23)
CO2: 26 mmol/L (ref 22–32)
Calcium: 8.6 mg/dL — ABNORMAL LOW (ref 8.9–10.3)
Chloride: 93 mmol/L — ABNORMAL LOW (ref 98–111)
Creatinine, Ser: 3.58 mg/dL — ABNORMAL HIGH (ref 0.44–1.00)
GFR, Estimated: 14 mL/min — ABNORMAL LOW (ref >60)
Glucose, Bld: 106 mg/dL — ABNORMAL HIGH (ref 70–99)
Potassium: 3.1 mmol/L — ABNORMAL LOW (ref 3.5–5.1)
Sodium: 130 mmol/L — ABNORMAL LOW (ref 135–145)

## 2021-02-14 LAB — TROPONIN I (HIGH SENSITIVITY): Troponin I (High Sensitivity): 7 ng/L (ref <18)

## 2021-02-14 NOTE — ED Triage Notes (Signed)
Pt to ED [facility] for chest pain, shob and body aches that started today.  +nausea, headache Hx CHF RR even and unlabored, NAD noted, speaking in complete sentences Received dialysis yesterday.  100% on 4L [place], wears 2 L [place]

## 2021-02-25 ENCOUNTER — Other Ambulatory Visit: Payer: Self-pay | Admitting: Family Medicine

## 2021-02-25 DIAGNOSIS — Z13820 Encounter for screening for osteoporosis: Secondary | ICD-10-CM

## 2021-03-05 ENCOUNTER — Ambulatory Visit (INDEPENDENT_AMBULATORY_CARE_PROVIDER_SITE_OTHER): Payer: Medicare Other

## 2021-03-05 ENCOUNTER — Other Ambulatory Visit: Payer: Self-pay

## 2021-03-05 DIAGNOSIS — Z9862 Peripheral vascular angioplasty status: Secondary | ICD-10-CM | POA: Diagnosis not present

## 2021-03-05 DIAGNOSIS — N186 End stage renal disease: Secondary | ICD-10-CM

## 2021-03-05 DIAGNOSIS — T829XXS Unspecified complication of cardiac and vascular prosthetic device, implant and graft, sequela: Secondary | ICD-10-CM

## 2021-03-05 DIAGNOSIS — I70208 Unspecified atherosclerosis of native arteries of extremities, other extremity: Secondary | ICD-10-CM

## 2021-03-06 IMAGING — MR MR ABDOMEN W/O CM
9 series · 38 of 48 positions shown · non-contrast
Comparison: CT of the abdomen and pelvis with intravenous contrast
from April 06, 2020 and interval CTs obtained without contrast.

CLINICAL DATA: 59-year-old female on dialysis with reported renal
mass.

EXAM:
MRI ABDOMEN WITHOUT CONTRAST
TECHNIQUE: Multiplanar multisequence MR imaging was performed without the
administration of intravenous contrast.

[Series 2: T2 · coronal · 6.0mm · 1.19mm/px · 3 of 35 slices shown (1 of 2)]
[im 1/35]
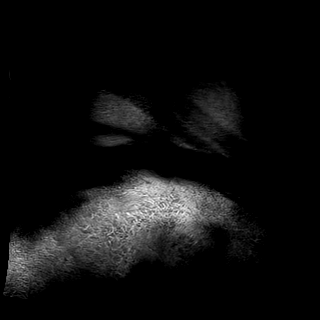
[im 18/35]
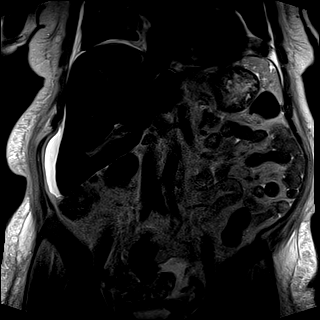
[im 35/35]
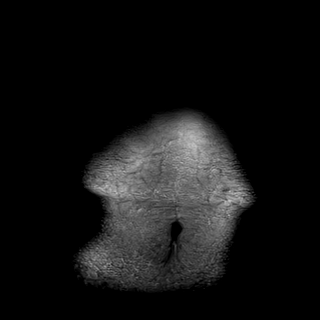

[Series 3: T2 · axial · 6.0mm · 1.19mm/px · z∈[-160,+85]mm · 4 of 35 slices shown (2 of 2)]
[im 1/35]
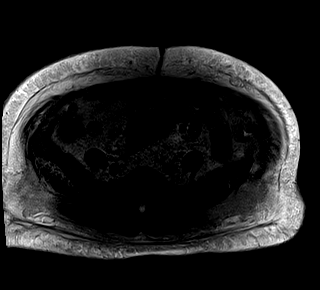
[im 12/35]
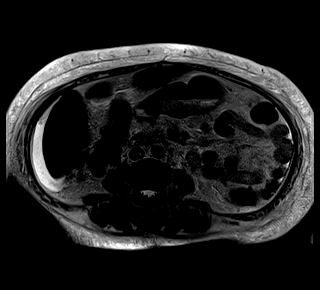
[im 23/35]
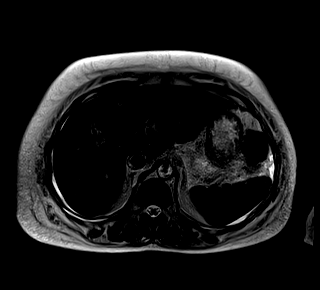
[im 35/35]
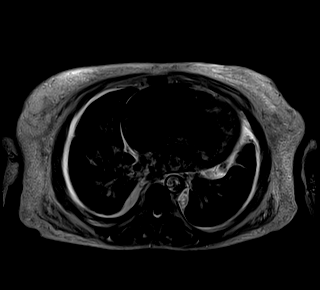

[Series 4: T2 fat-sat · axial · 6.0mm · 1.19mm/px · z∈[-149,+89]mm · 4 of 34 slices shown]
[im 1/34]
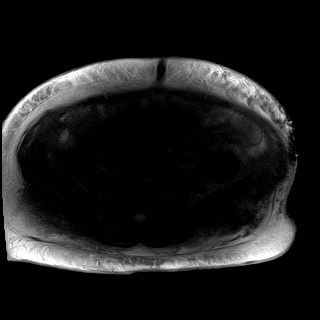
[im 12/34]
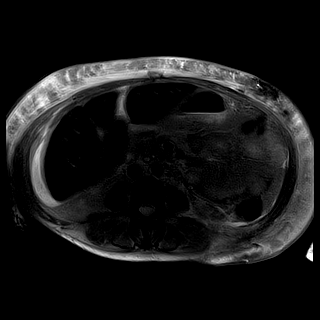
[im 23/34]
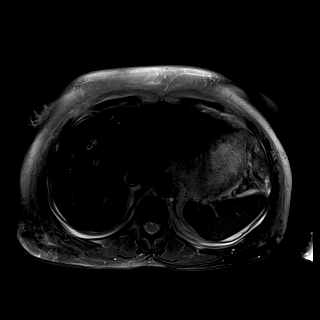
[im 34/34]
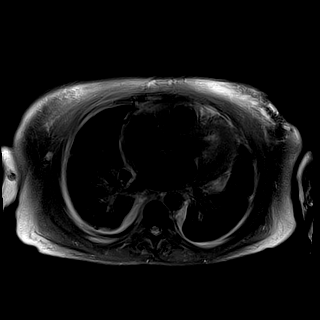

[Series 5: ax dwi_tracew · axial · 6.0mm · 1.42mm/px · z∈[-149,+89]mm · 9 of 102 slices shown]
[im 1/102]
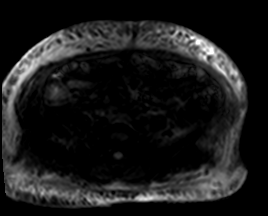
[im 19/102]
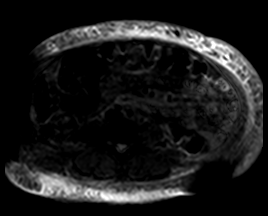
[im 28/102]
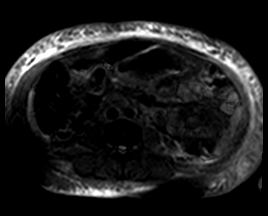
[im 46/102]
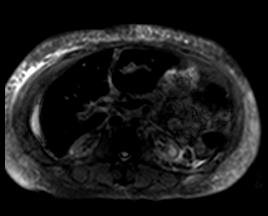
[im 56/102]
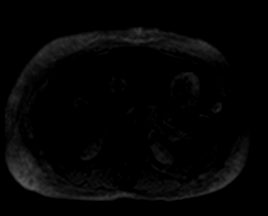
[im 74/102]
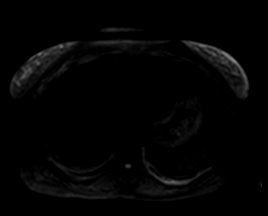
[im 83/102]
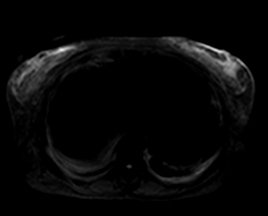
[im 92/102]
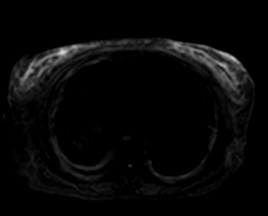
[im 102/102]
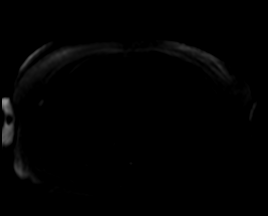

[Series 6: ax dwi_adc · axial · 6.0mm · 1.42mm/px · z∈[-149,+89]mm · 4 of 34 slices shown]
[im 1/34]
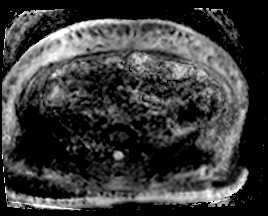
[im 12/34]
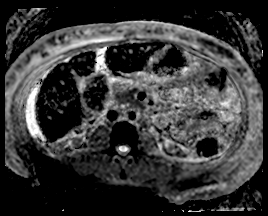
[im 23/34]
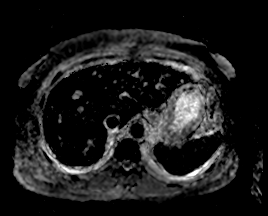
[im 34/34]
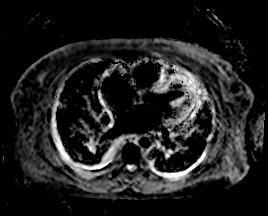

[Series 7: T1 · axial · 6.0mm · 0.74mm/px · z∈[-160,+85]mm · 4 of 35 slices shown (1 of 2)]
[im 1/35]
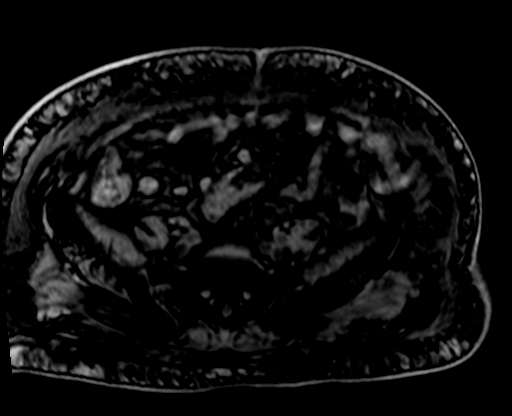
[im 12/35]
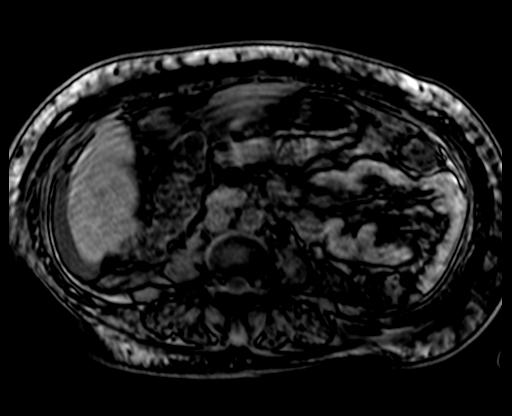
[im 23/35]
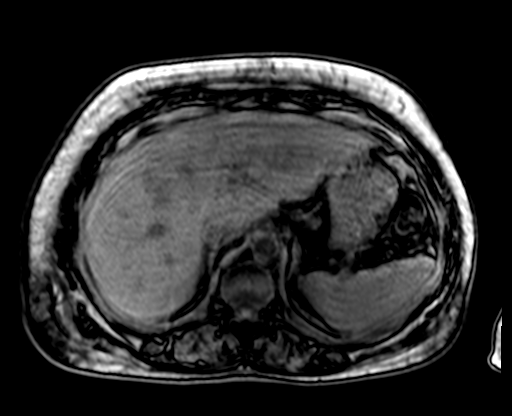
[im 35/35]
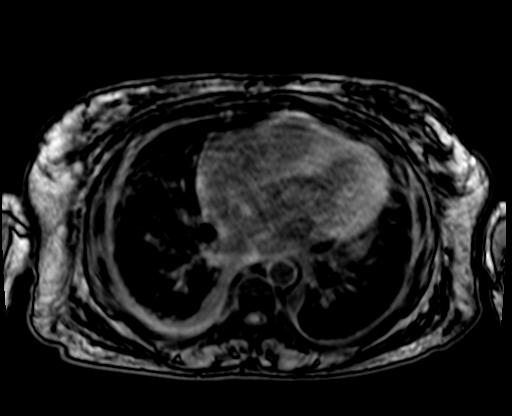

[Series 7: T1 · axial · 6.0mm · 0.74mm/px · z∈[-160,+85]mm · 4 of 35 slices shown (2 of 2)]
[im 1/35]
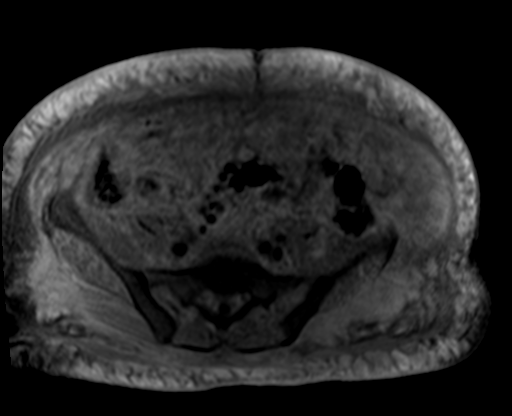
[im 12/35]
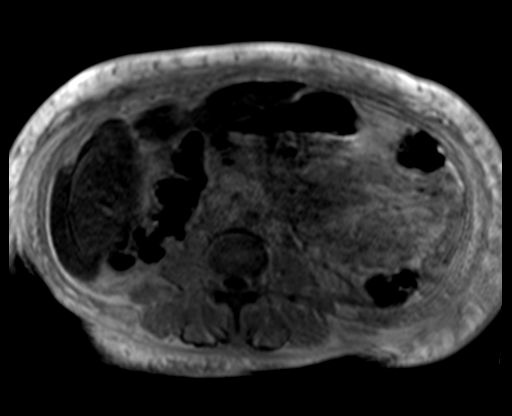
[im 23/35]
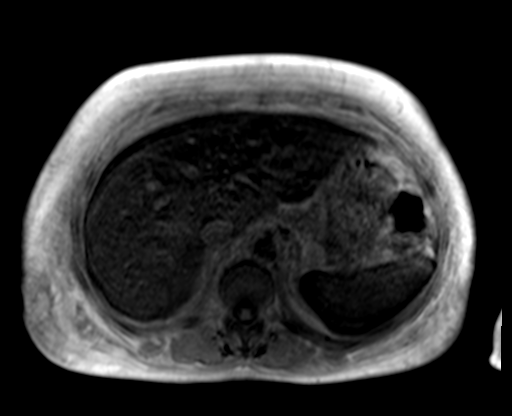
[im 35/35]
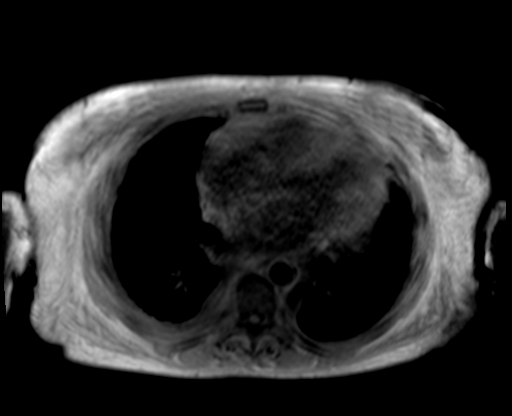

[Series 8: bSSFP · axial · 6.0mm · 0.74mm/px · z∈[-160,+85]mm · 4 of 35 slices shown]
[im 1/35]
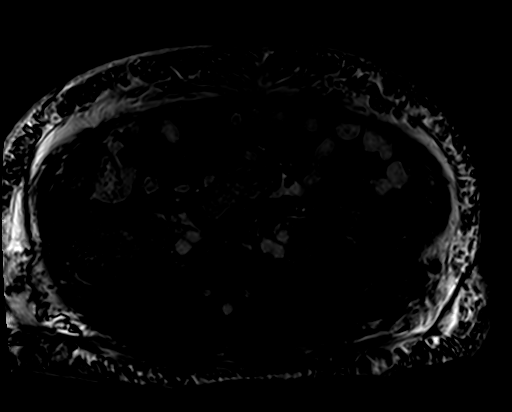
[im 12/35]
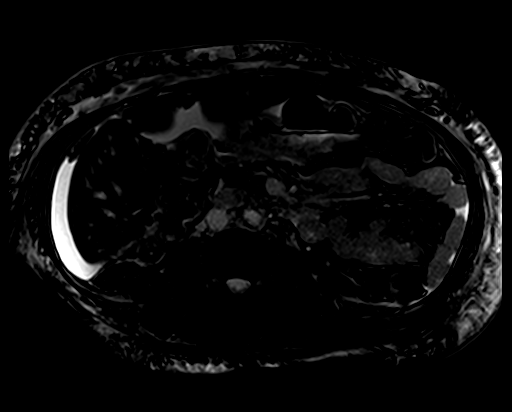
[im 23/35]
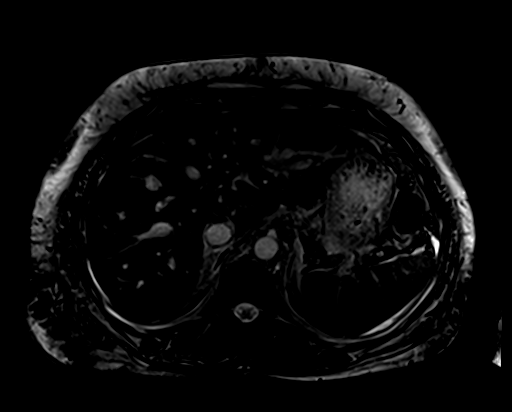
[im 35/35]
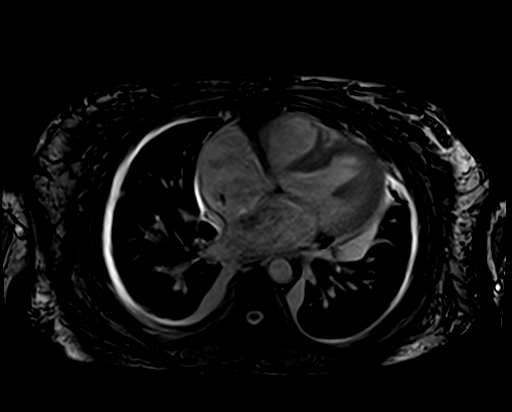

[Series 9: T1 dynamic fat-sat · axial · non-contrast · 3.0mm · 1.19mm/px · z∈[-128,-101]mm · 2 of 80 slices shown]
[im 1/80]
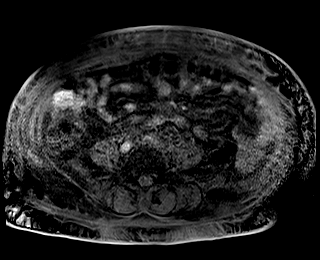
[im 10/80]
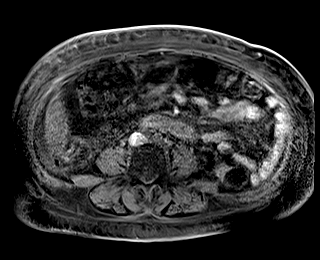

[38 of 48 positions shown; findings below may reference images not displayed]

FINDINGS: Lower chest: Small effusions. Grossly similar to prior imaging
limited assessment of lung bases on MRI.

Hepatobiliary: Marked hepatic iron deposition. No focal hepatic
lesion on noncontrast imaging. Prominence of the biliary tree
following cholecystectomy is unchanged.

Pancreas: Some pancreatic atrophy the with normal pancreatic
contour. Preserved relative intrinsic T1 signal in the pancreas
despite atrophy.

Spleen:  Iron deposition in the spleen.  No focal splenic lesion.

Adrenals/Urinary Tract: Adrenal glands are normal. Kidneys with
marked atrophy. Previously described renal lesions not well
evaluated given anasarca, small size of lesions and lack of
contrast. No definitive signs of treatment in these areas. No
hydronephrosis.

Mild contour irregularity along the medial RIGHT kidney towards the
upper pole approximately 1 cm may correspond to the lesion of
concern in this location on the previous exam (image 17 of series 3)

Small enhancing focus seen along the lateral upper pole of the RIGHT
kidney not well evaluated. Subtle variable signal on steady-state
images, image 17 of series 8 approximately 7 mm may correspond to
the site of this lesion.

Small cyst in the lower pole of the RIGHT kidney and in the
interpolar LEFT kidney.

Stomach/Bowel: No acute gastrointestinal process to the extent
evaluated. Signs of generalized bowel edema in the setting of
anasarca. Sigmoid diverticular disease.

Vascular/Lymphatic: No aneurysmal dilation of abdominal vasculature.
There is no gastrohepatic or hepatoduodenal ligament
lymphadenopathy. No retroperitoneal or mesenteric lymphadenopathy.

Other: Small volume ascites about the liver and in the pelvis is
partially imaged on today's evaluation.

Musculoskeletal: Stitches 1
IMPRESSION: 1. Areas of concern in the RIGHT kidney seen on previous imaging
from [place] are not well evaluated. No definite post treatment changes
are noted though contrast was not administered. Contours are grossly
stable but again not well evaluated. CT follow-up may be the best
option for this patient who is on dialysis.
2. Anasarca with increasing body wall edema now with small volume
ascites.
3. Small cyst in the lower pole of the RIGHT kidney and interpolar
LEFT kidney.
4. Marked hepatic and splenic iron deposition, likely related to
exogenous iron administration.
5. Small volume ascites in the setting of anasarca.
6. Sigmoid diverticular disease.

## 2021-03-13 ENCOUNTER — Other Ambulatory Visit: Payer: Self-pay | Admitting: Interventional Radiology

## 2021-03-13 DIAGNOSIS — N2889 Other specified disorders of kidney and ureter: Secondary | ICD-10-CM

## 2021-03-14 ENCOUNTER — Ambulatory Visit (INDEPENDENT_AMBULATORY_CARE_PROVIDER_SITE_OTHER): Payer: Medicare Other | Admitting: Vascular Surgery

## 2021-03-15 ENCOUNTER — Telehealth: Payer: Self-pay | Admitting: Primary Care

## 2021-03-15 NOTE — Telephone Encounter (Signed)
Spoke with patient's son [name], regarding scheduling a Palliative Consult in the home (post discharge home from [facility]) and he requested that I contact patient due to her dialysis appointments and other MD appointments.  Son stated that if I don't reach her this morning to try sometime this afternoon after 12 and if I don't get her then to contact him, and he gets off work at 3:30 and would go by the patient's home so that he could let her know and call me back to try and get something scheduled.    I then tried to call patient to schedule visit, no answer - left message requesting a return call.

## 2021-03-15 NOTE — Telephone Encounter (Signed)
Rec'd message that patient had returned my call and said she was getting ready to leave dialysis, just returned her call to offer to schedule a Palliative Consult in the home with no answer - left message requesting a return call.

## 2021-03-17 ENCOUNTER — Emergency Department: Payer: Medicare Other

## 2021-03-17 ENCOUNTER — Emergency Department
Admission: EM | Admit: 2021-03-17 | Discharge: 2021-03-18 | Disposition: A | Discharge status: Home / Self Care | Payer: Medicare Other | Attending: Emergency Medicine | Admitting: Emergency Medicine

## 2021-03-17 ENCOUNTER — Encounter: Payer: Self-pay | Admitting: Emergency Medicine

## 2021-03-17 ENCOUNTER — Other Ambulatory Visit: Payer: Self-pay

## 2021-03-17 DIAGNOSIS — Z992 Dependence on renal dialysis: Secondary | ICD-10-CM | POA: Insufficient documentation

## 2021-03-17 DIAGNOSIS — Z85048 Personal history of other malignant neoplasm of rectum, rectosigmoid junction, and anus: Secondary | ICD-10-CM | POA: Insufficient documentation

## 2021-03-17 DIAGNOSIS — R140 Abdominal distension (gaseous): Secondary | ICD-10-CM | POA: Insufficient documentation

## 2021-03-17 DIAGNOSIS — E8770 Fluid overload, unspecified: Secondary | ICD-10-CM | POA: Diagnosis not present

## 2021-03-17 DIAGNOSIS — R109 Unspecified abdominal pain: Secondary | ICD-10-CM | POA: Insufficient documentation

## 2021-03-17 DIAGNOSIS — Z79899 Other long term (current) drug therapy: Secondary | ICD-10-CM | POA: Insufficient documentation

## 2021-03-17 DIAGNOSIS — J449 Chronic obstructive pulmonary disease, unspecified: Secondary | ICD-10-CM | POA: Insufficient documentation

## 2021-03-17 DIAGNOSIS — R0602 Shortness of breath: Secondary | ICD-10-CM | POA: Insufficient documentation

## 2021-03-17 DIAGNOSIS — I132 Hypertensive heart and chronic kidney disease with heart failure and with stage 5 chronic kidney disease, or end stage renal disease: Secondary | ICD-10-CM | POA: Diagnosis not present

## 2021-03-17 DIAGNOSIS — R112 Nausea with vomiting, unspecified: Secondary | ICD-10-CM | POA: Diagnosis not present

## 2021-03-17 DIAGNOSIS — J45909 Unspecified asthma, uncomplicated: Secondary | ICD-10-CM | POA: Insufficient documentation

## 2021-03-17 DIAGNOSIS — Z7982 Long term (current) use of aspirin: Secondary | ICD-10-CM | POA: Insufficient documentation

## 2021-03-17 DIAGNOSIS — E1140 Type 2 diabetes mellitus with diabetic neuropathy, unspecified: Secondary | ICD-10-CM | POA: Diagnosis not present

## 2021-03-17 DIAGNOSIS — I5032 Chronic diastolic (congestive) heart failure: Secondary | ICD-10-CM | POA: Insufficient documentation

## 2021-03-17 DIAGNOSIS — N186 End stage renal disease: Secondary | ICD-10-CM | POA: Insufficient documentation

## 2021-03-17 DIAGNOSIS — I2510 Atherosclerotic heart disease of native coronary artery without angina pectoris: Secondary | ICD-10-CM | POA: Diagnosis not present

## 2021-03-17 DIAGNOSIS — R600 Localized edema: Secondary | ICD-10-CM | POA: Diagnosis present

## 2021-03-17 DIAGNOSIS — J9611 Chronic respiratory failure with hypoxia: Secondary | ICD-10-CM

## 2021-03-17 DIAGNOSIS — M1990 Unspecified osteoarthritis, unspecified site: Secondary | ICD-10-CM

## 2021-03-17 DIAGNOSIS — E1122 Type 2 diabetes mellitus with diabetic chronic kidney disease: Secondary | ICD-10-CM | POA: Insufficient documentation

## 2021-03-17 LAB — CBC WITH DIFFERENTIAL/PLATELET
Abs Immature Granulocytes: 0.01 10*3/uL (ref 0.00–0.07)
Basophils Absolute: 0.1 10*3/uL (ref 0.0–0.1)
Basophils Relative: 1 %
Eosinophils Absolute: 1.3 10*3/uL — ABNORMAL HIGH (ref 0.0–0.5)
Eosinophils Relative: 25 %
HCT: 31.4 % — ABNORMAL LOW (ref 36.0–46.0)
Hemoglobin: 10.5 g/dL — ABNORMAL LOW (ref 12.0–15.0)
Immature Granulocytes: 0 %
Lymphocytes Relative: 25 %
Lymphs Abs: 1.2 10*3/uL (ref 0.7–4.0)
MCH: 26.8 pg (ref 26.0–34.0)
MCHC: 33.4 g/dL (ref 30.0–36.0)
MCV: 80.1 fL (ref 80.0–100.0)
Monocytes Absolute: 0.3 10*3/uL (ref 0.1–1.0)
Monocytes Relative: 6 %
Neutro Abs: 2.1 10*3/uL (ref 1.7–7.7)
Neutrophils Relative %: 43 %
Platelets: 184 10*3/uL (ref 150–400)
RBC: 3.92 MIL/uL (ref 3.87–5.11)
RDW: 20.4 % — ABNORMAL HIGH (ref 11.5–15.5)
WBC: 4.9 10*3/uL (ref 4.0–10.5)
nRBC: 0 % (ref 0.0–0.2)

## 2021-03-17 LAB — PROTIME-INR
INR: 1.3 — ABNORMAL HIGH (ref 0.8–1.2)
Prothrombin Time: 16.5 seconds — ABNORMAL HIGH (ref 11.4–15.2)

## 2021-03-17 LAB — COMPREHENSIVE METABOLIC PANEL
ALT: 11 U/L (ref 0–44)
AST: 19 U/L (ref 15–41)
Albumin: 2.6 g/dL — ABNORMAL LOW (ref 3.5–5.0)
Alkaline Phosphatase: 112 U/L (ref 38–126)
Anion gap: 7 (ref 5–15)
BUN: 18 mg/dL (ref 8–23)
CO2: 29 mmol/L (ref 22–32)
Calcium: 8.6 mg/dL — ABNORMAL LOW (ref 8.9–10.3)
Chloride: 96 mmol/L — ABNORMAL LOW (ref 98–111)
Creatinine, Ser: 4.69 mg/dL — ABNORMAL HIGH (ref 0.44–1.00)
GFR, Estimated: 10 mL/min — ABNORMAL LOW (ref >60)
Glucose, Bld: 96 mg/dL (ref 70–99)
Potassium: 4 mmol/L (ref 3.5–5.1)
Sodium: 132 mmol/L — ABNORMAL LOW (ref 135–145)
Total Bilirubin: 0.6 mg/dL (ref 0.3–1.2)
Total Protein: 6.9 g/dL (ref 6.5–8.1)

## 2021-03-17 LAB — TROPONIN I (HIGH SENSITIVITY)
Troponin I (High Sensitivity): 5 ng/L (ref <18)
Troponin I (High Sensitivity): 5 ng/L (ref <18)

## 2021-03-17 LAB — BRAIN NATRIURETIC PEPTIDE: B Natriuretic Peptide: 727.9 pg/mL — ABNORMAL HIGH (ref 0.0–100.0)

## 2021-03-17 LAB — APTT: aPTT: 43 seconds — ABNORMAL HIGH (ref 24–36)

## 2021-03-17 MED ORDER — ACETAMINOPHEN 500 MG PO TABS: 1000.0000 mg | ORAL_TABLET | Freq: Once | ORAL | Status: DC

## 2021-03-17 MED ORDER — FUROSEMIDE 10 MG/ML IJ SOLN: 80.0000 mg | Freq: Once | INTRAMUSCULAR | Status: DC

## 2021-03-17 NOTE — ED Provider Notes (Signed)
Emergency Medicine Provider Triage Evaluation Note  [name] , a 60 y.o. female  was evaluated in triage.  Pt come in via EMS for evaluation of leaking fluid from thigh of right leg, bilateral leg pain. Patient with extensive medical history including ESRD, CHF, and recent admission for anasarca. She reports this all began last night, reports her abdomen also feels distended. Denies increased SOB, arrives on her stable O2.  Review of Systems  Positive: Leg pain, weeping from right leg Negative: New shortness of breath, chest pain  Physical Exam  Temp 97.8 F (36.6 C)   Ht 5\' 5"  (1.651 m)   Wt 80 kg   BMI 29.35 kg/m  Gen:   Awake, no distress  Resp:  Normal effort with baseline chronic O2 MSK:   Chronic venous stasis changes in BLE, tiny weeping wound from lateral right leg. Palpation of the BLE is tight, appears fluid overloaded. Other:  Palpation of the abdomen is tight and painful to palpation  Medical Decision Making  Medically screening exam initiated at 7:08 PM.  Appropriate orders placed.  AMBRY DIX was informed that the remainder of the evaluation will be completed by another provider, this initial triage assessment does not replace that evaluation, and the importance of remaining in the ED until their evaluation is complete.  Review of chart reveals recent admission for anasarca, CHF. Last dialysis Friday.   Marlana Salvage, PA 03/17/21 1920    Lucrezia Starch, MD 03/17/21 [phone]

## 2021-03-17 NOTE — ED Provider Notes (Signed)
Believe [facility]  [facility] Emergency Department Provider Note  ____________________________________________   Event Date/Time   First MD Initiated Contact with Patient 03/17/21 2221     (approximate)  I have reviewed the triage vital signs and the nursing notes.   HISTORY  Chief Complaint Knee Pain and Leg Pain   HPI [name] is a 60 y.o. female with medical history significant for ESRD on HD M/W/F (no recently missed sessions), DM, Chronic diastolic HF, CAD, PVD,  atherosclerotic occlusive disease b/l UE s/p right brachial artery angioplasty on 11/27/20 who presents with concerns of worsening abdominal and LE edema and pain similar to presentation when she was hospitalized in March of earlier this year.  Patient states her symptoms started getting worse yesterday and she noticed some drainage of clear fluid from the right upper leg.  Patient states she had a scab on her right leg and when this came off some clear fluid has been draining since.  She has not had any bleeding or drainage of yellow fluid.  No surrounding skin changes.  She endorses fairly severe chronic pain in bilateral lower extremities abdomen and back.  States she will often have vomiting after dialysis but no recent vomiting over the weekend.  She denies any burning with urination, chest pain, cough, shortness of breath, headache or earache, sore throat or recent injuries or falls.  Denies any other acute concerns at this time.  States she typically ambulates with a walker at home.         Past Medical History:  Diagnosis Date   Anemia    Anginal pain (HCC)    Anxiety    Arthritis    Asthma    Broken wrist    Bronchitis    chronic diastolic CHF 04/24/2121   Chronic kidney disease    COPD (chronic obstructive pulmonary disease) ([place])    Coronary artery disease    a. cath 2013: stenting to RCA (report not available); b. cath 2014: LM nl, pLAD 40%, mLAD nl, ost LCx 40%, mid LCx nl, pRCA 30% @  site of prior stent, mRCA 50%   Depression    Diabetes mellitus ([place])    Diabetes mellitus without complication ([place])    Diabetic neuropathy ([place])    dialysis 2006   Diverticulosis    Dizziness    Dyspnea    Elevated lipids    Environmental and seasonal allergies    ESRD (end stage renal disease) on dialysis ([place])    M-W-F   Gastroparesis    GERD (gastroesophageal reflux disease)    Headache    History of anemia due to chronic kidney disease    History of hiatal hernia    HOH (hard of hearing)    Hx of pancreatitis 2015   Hypertension    Lower extremity edema    Mitral regurgitation    a. echo 10/2013: EF 62%, noWMA, mildly dilated LA, mild to mod MR/TR, GR1DD   Myocardial infarction ([place])    Orthopnea    Parathyroid abnormality ([place])    Peripheral arterial disease ([place])    Pneumonia    Renal cancer ([place])    Renal insufficiency    Pt is on dialysis on M,W + F.   Wheezing     Patient Active Problem List   Diagnosis Date Noted   Chronic respiratory failure with hypoxia ([place]) 12/18/2020   Superior vena caval syndrome 12/14/2020   Chronic diastolic CHF (congestive heart failure) ([place]) 12/04/2020  Bradycardia 09/17/2020   Advanced care planning/counseling discussion    Discitis of thoracic region    Fluid overload 08/10/2020   HLD (hyperlipidemia) 08/10/2020   Chest pain 08/10/2020   CAD (coronary artery disease) 08/10/2020   GERD (gastroesophageal reflux disease) 08/10/2020   Obesity (BMI 30-39.9) 06/06/2020   Anemia in ESRD (end-stage renal disease) ([place]) 05/31/2020   Type 1 diabetes mellitus ([place]) 05/31/2020   End-stage renal disease ([place]) 05/31/2020   Hypertensive disorder 05/31/2020   Arm pain 05/21/2020   Cellulitis of right leg 05/02/2020   ESRD (end stage renal disease) ([place]) 04/13/2020   SOBOE (shortness of breath on exertion) 02/08/2020   Hematoma of arm, left, subsequent encounter 11/24/2019   Respiratory failure (HCC)    Shortness of breath     Complication of arteriovenous dialysis fistula 11/13/2019   PVD (peripheral vascular disease) ([place]) 11/13/2019   Severe anemia 11/13/2019   Pressure injury of skin 11/13/2019   ESRD on hemodialysis ([place])    Hemorrhagic shock ([place]) 11/11/2019   Leg pain 10/02/2019   CAP (community acquired pneumonia) 10/01/2019   Atherosclerosis of native arteries of extremity with intermittent claudication ([place]) 05/01/2019   Right-sided headache    COPD (chronic obstructive pulmonary disease) ([place]) 10/06/2018   Syncope 10/04/2018   Symptomatic anemia 06/18/2018   Gastroparesis due to DM ([place]) 20/25/4270   Complication of vascular access for dialysis 12/04/2017   Osteomyelitis ([place]) 09/30/2017   Carotid stenosis 06/18/2017   Bilateral carotid artery stenosis 06/16/2017   Benign essential HTN 05/19/2017   CKD (chronic kidney disease) stage 5, GFR less than 15 ml/min ([place]) 05/19/2017   Hyperlipidemia, mixed 05/19/2017   Shortness of breath 05/04/2017   Rash, skin 12/16/2016   Cellulitis of lower extremity 07/29/2016   Chronic venous insufficiency 07/29/2016   Lymphedema 07/29/2016   TIA (transient ischemic attack) 04/21/2016   Altered mental status 04/08/2016   Hyperammonemia ([place]) 04/08/2016   Elevated troponin 04/08/2016   Depression 04/08/2016   Depression, major, recurrent, severe with psychosis ([place]) 04/08/2016   Blood in stool    Intractable cyclical vomiting with nausea    Reflux esophagitis    Gastritis    Generalized abdominal pain    Uncontrollable vomiting    Major depressive disorder, recurrent episode, moderate ([place]) 03/15/2016   Adjustment disorder with mixed anxiety and depressed mood 03/15/2016   Malnutrition of moderate degree 12/01/2015   Renal mass    Dyspnea    Acute renal failure (HCC)    Respiratory failure ([place])    High temperature 11/14/2015   Encounter for central line placement    Encounter for orogastric (OG) tube placement    Nausea 11/12/2015   Hyperkalemia  10/03/2015   Diarrhea, unspecified 07/22/2015   Pneumonia 05/21/2015   Hypoglycemia 04/24/2015   Unresponsiveness 04/24/2015   Sinus bradycardia 04/24/2015   Hypothermia 04/24/2015   Acute respiratory failure ([place]) 04/24/2015   Acute on chronic diastolic CHF (congestive heart failure) ([place]) 04/05/2015   Diabetic gastroparesis ([place]) 04/05/2015   Hypokalemia 04/05/2015   Generalized weakness 04/05/2015   Acute pulmonary edema (HCC) 04/03/2015   Nausea and vomiting 04/03/2015   Hypoglycemia associated with diabetes ([place]) 04/03/2015   Anemia of chronic disease 04/03/2015   Secondary hyperparathyroidism ([place]) 04/03/2015   Pressure ulcer 04/02/2015   Acute respiratory failure with hypoxia ([place]) 04/01/2015   Adjustment disorder with anxiety 03/14/2015   Somatic symptom disorder, mild 03/08/2015   Coronary artery disease involving native coronary artery of native heart without angina pectoris  Nausea & vomiting 03/06/2015   Abdominal pain 03/06/2015   Type 2 diabetes mellitus with hypoglycemia without coma, without long-term current use of insulin ([place]) 03/06/2015   HTN (hypertension) 03/06/2015   Gastroparesis 02/24/2015   Pleural effusion 02/19/2015   Bacteremia due to Enterococcus 02/19/2015   End-stage renal disease on hemodialysis ([place]) 02/19/2015   Diarrhea 08/01/2014    Past Surgical History:  Procedure Laterality Date   A/V SHUNTOGRAM Left 01/20/2018   Procedure: A/V SHUNTOGRAM;  Surgeon: Algernon Huxley, MD;  Location: [place] [facility];  Service: Cardiovascular;  Laterality: Left;   ABDOMINAL HYSTERECTOMY  1992   AMPUTATION TOE Left 10/02/2017   Procedure: AMPUTATION TOE-LEFT GREAT TOE;  Surgeon: Albertine Patricia, DPM;  Location: [facility];  Service: Podiatry;  Laterality: Left;   APPENDECTOMY     APPLICATION OF WOUND VAC N/A 11/25/2019   Procedure: APPLICATION OF WOUND VAC;  Surgeon: Katha Cabal, MD;  Location: [facility];  Service: Vascular;  Laterality: N/A;   ARTERY  BIOPSY Right 10/11/2018   Procedure: BIOPSY TEMPORAL ARTERY;  Surgeon: Vickie Epley, MD;  Location: [facility];  Service: General;  Laterality: Right;   CARDIAC CATHETERIZATION Left 07/26/2015   Procedure: Left Heart Cath and Coronary Angiography;  Surgeon: Dionisio David, MD;  Location: [place] [facility];  Service: Cardiovascular;  Laterality: Left;   CATARACT EXTRACTION W/ INTRAOCULAR LENS IMPLANT Right    CATARACT EXTRACTION W/PHACO Left 03/10/2017   Procedure: CATARACT EXTRACTION PHACO AND INTRAOCULAR LENS PLACEMENT ([place]);  Surgeon: Birder Robson, MD;  Location: [facility];  Service: Ophthalmology;  Laterality: Left;  [place] 00:51.9 AP% 14.2 CDE 7.39 fluid pack lot # [id number]   CENTRAL LINE INSERTION Right 11/11/2019   Procedure: CENTRAL LINE INSERTION;  Surgeon: Katha Cabal, MD;  Location: [facility];  Service: Vascular;  Laterality: Right;   CENTRAL LINE INSERTION  11/25/2019   Procedure: CENTRAL LINE INSERTION;  Surgeon: Katha Cabal, MD;  Location: [facility];  Service: Vascular;;   CHOLECYSTECTOMY     COLONOSCOPY WITH PROPOFOL N/A 08/12/2016   Procedure: COLONOSCOPY WITH PROPOFOL;  Surgeon: Lollie Sails, MD;  Location: [facility] ENDOSCOPY;  Service: Endoscopy;  Laterality: N/A;   DIALYSIS FISTULA CREATION Left    upper arm   dialysis grafts     DIALYSIS/PERMA CATHETER INSERTION N/A 11/14/2019   Procedure: DIALYSIS/PERMA CATHETER INSERTION;  Surgeon: Algernon Huxley, MD;  Location: [place] [facility];  Service: Cardiovascular;  Laterality: N/A;   DIALYSIS/PERMA CATHETER INSERTION N/A 02/03/2020   Procedure: DIALYSIS/PERMA CATHETER INSERTION;  Surgeon: Katha Cabal, MD;  Location: [place] [facility];  Service: Cardiovascular;  Laterality: N/A;   DIALYSIS/PERMA CATHETER INSERTION N/A 06/19/2020   Procedure: DIALYSIS/PERMA CATHETER INSERTION;  Surgeon: Katha Cabal, MD;  Location: [place] [facility];  Service: Cardiovascular;  Laterality: N/A;   DIALYSIS/PERMA  CATHETER REMOVAL N/A 05/25/2020   Procedure: DIALYSIS/PERMA CATHETER REMOVAL;  Surgeon: Katha Cabal, MD;  Location: [place] [facility];  Service: Cardiovascular;  Laterality: N/A;   ESOPHAGOGASTRODUODENOSCOPY N/A 03/08/2015   Procedure: ESOPHAGOGASTRODUODENOSCOPY (EGD);  Surgeon: Manya Silvas, MD;  Location[facility] ENDOSCOPY;  Service: Endoscopy;  Laterality: N/A;   ESOPHAGOGASTRODUODENOSCOPY (EGD) WITH PROPOFOL N/A 03/18/2016   Procedure: ESOPHAGOGASTRODUODENOSCOPY (EGD) WITH PROPOFOL;  Surgeon: [name], MD;  Location: [facility];  Service: Endoscopy;  Laterality: N/A;   EYE SURGERY Right 2018   FECAL TRANSPLANT N/A 08/23/2015   Procedure: FECAL TRANSPLANT;  Surgeon: Manya Silvas, MD;  Location: [facility]  ENDOSCOPY;  Service: Endoscopy;  Laterality: N/A;   HAND SURGERY Bilateral    HEMATOMA EVACUATION Left 11/25/2019   Procedure: EVACUATION HEMATOMA;  Surgeon: Katha Cabal, MD;  Location: [facility];  Service: Vascular;  Laterality: Left;   I & D EXTREMITY Left 11/25/2019   Procedure: IRRIGATION AND DEBRIDEMENT EXTREMITY;  Surgeon: Katha Cabal, MD;  Location: [facility];  Service: Vascular;  Laterality: Left;   IR FLUORO GUIDE CV LINE RIGHT  04/06/2020   IR INJECT/THERA/INC NEEDLE/CATH/PLC EPI/CERV/THOR W/IMG  08/13/2020   IR RADIOLOGIST EVAL & MGMT  07/28/2019   IR RADIOLOGIST EVAL & MGMT  08/11/2019   LIGATION OF ARTERIOVENOUS  FISTULA Left 11/11/2019   LIGATION OF ARTERIOVENOUS  FISTULA Left 11/11/2019   Procedure: LIGATION OF ARTERIOVENOUS  FISTULA;  Surgeon: Katha Cabal, MD;  Location: [facility];  Service: Vascular;  Laterality: Left;   LIGATIONS OF HERO GRAFT Right 06/13/2020   Procedure: LIGATION / REMOVAL OF RIGHT HERO GRAFT;  Surgeon: Katha Cabal, MD;  Location: [facility];  Service: Vascular;  Laterality: Right;   PERIPHERAL VASCULAR CATHETERIZATION N/A 12/20/2015   Procedure: Thrombectomy of dialysis access versus permcath placement;  Surgeon: Algernon Huxley,  MD;  Location: [place] [facility];  Service: Cardiovascular;  Laterality: N/A;   PERIPHERAL VASCULAR CATHETERIZATION N/A 12/20/2015   Procedure: A/V Shunt Intervention;  Surgeon: Algernon Huxley, MD;  Location: [place] [facility];  Service: Cardiovascular;  Laterality: N/A;   PERIPHERAL VASCULAR CATHETERIZATION N/A 12/20/2015   Procedure: A/V Shuntogram/Fistulagram;  Surgeon: Algernon Huxley, MD;  Location: [place] [facility];  Service: Cardiovascular;  Laterality: N/A;   PERIPHERAL VASCULAR CATHETERIZATION N/A 01/02/2016   Procedure: A/V Shuntogram/Fistulagram;  Surgeon: Algernon Huxley, MD;  Location: [place] [facility];  Service: Cardiovascular;  Laterality: N/A;   PERIPHERAL VASCULAR CATHETERIZATION N/A 01/02/2016   Procedure: A/V Shunt Intervention;  Surgeon: Algernon Huxley, MD;  Location: [place] [facility];  Service: Cardiovascular;  Laterality: N/A;   TEE WITHOUT CARDIOVERSION N/A 06/11/2020   Procedure: TRANSESOPHAGEAL ECHOCARDIOGRAM (TEE);  Surgeon: Teodoro Spray, MD;  Location: [facility];  Service: Cardiovascular;  Laterality: N/A;   UPPER EXTREMITY ANGIOGRAPHY Bilateral 11/27/2020   Procedure: UPPER EXTREMITY ANGIOGRAPHY;  Surgeon: Katha Cabal, MD;  Location: [place] [facility];  Service: Cardiovascular;  Laterality: Bilateral;   UPPER EXTREMITY VENOGRAPHY Right 01/18/2020   Procedure: UPPER EXTREMITY VENOGRAPHY;  Surgeon: Katha Cabal, MD;  Location: [place] [facility];  Service: Cardiovascular;  Laterality: Right;   UPPER EXTREMITY VENOGRAPHY Bilateral 11/27/2020   Procedure: UPPER EXTREMITY VENOGRAPHY;  Surgeon: Katha Cabal, MD;  Location: [place] [facility];  Service: Cardiovascular;  Laterality: Bilateral;   VASCULAR ACCESS DEVICE INSERTION Right 04/13/2020   Procedure: INSERTION OF HERO VASCULAR ACCESS DEVICE (GRAFT);  Surgeon: Katha Cabal, MD;  Location: [facility];  Service: Vascular;  Laterality: Right;    Prior to Admission medications   Medication Sig  Start Date End Date Taking? Authorizing Provider  albuterol (PROVENTIL) (2.5 MG/3ML) 0.083% nebulizer solution Take 2.5 mg by nebulization every 4 (four) hours as needed for wheezing or shortness of breath.    [provider]  albuterol (VENTOLIN HFA) 108 (90 Base) MCG/ACT inhaler Inhale 2 puffs into the lungs every 4 (four) hours as needed for wheezing or shortness of breath. 11/09/19   [provider]  alum & mag hydroxide-simeth (MAALOX/MYLANTA) 200-200-20 MG/5ML suspension Take 15 mLs by mouth every 6 (six) hours as  needed for indigestion or heartburn. 06/25/20   Enzo Bi, MD  amLODipine (NORVASC) 10 MG tablet Take 1 tablet (10 mg total) by mouth daily. 06/25/20   Enzo Bi, MD  ammonium lactate (LAC-HYDRIN) 12 % lotion Apply 1 application topically 2 (two) times daily as needed for dry skin.     [provider]  Ascorbic Acid (VITA-C PO) Take 1 tablet by mouth daily.    [provider]  aspirin EC 81 MG tablet Take 81 mg by mouth daily.    [provider]  atorvastatin (LIPITOR) 20 MG tablet Take 20 mg by mouth every evening.  10/24/19   [provider]  calcium carbonate (TUMS - DOSED IN MG ELEMENTAL CALCIUM) 500 MG chewable tablet Chew 2.5 tablets (500 mg of elemental calcium total) by mouth every 6 (six) hours as needed for indigestion. 06/25/20   Enzo Bi, MD  Cholecalciferol (VITAMIN D3) 25 MCG (1000 UT) CAPS Take 1,000 Units by mouth daily.     [provider]  cholestyramine light (PREVALITE) 4 g packet Take 4 g by mouth 2 (two) times daily.    [provider]  CREON 12000 units CPEP capsule Take 36,000 Units by mouth 3 (three) times daily before meals.  10/21/19   [provider]  cyanocobalamin 1000 MCG tablet Take 1,000 mcg by mouth daily.     [provider]  diphenhydrAMINE (BENADRYL) 25 MG tablet Take 25 mg by mouth every 6 (six) hours as needed for itching.    [provider]  epoetin alfa  (EPOGEN) 10000 UNIT/ML injection Inject 1 mL (10,000 Units total) into the vein every Monday, Wednesday, and Friday with hemodialysis. 06/27/20   Enzo Bi, MD  Ferrous Sulfate (IRON) 325 (65 Fe) MG TABS Take 325 mg by mouth daily.     [provider]  furosemide (LASIX) 80 MG tablet Take 1 tablet by mouth daily. 08/13/20   [provider]  gabapentin (NEURONTIN) 100 MG capsule Take 100 mg by mouth every evening.     [provider]  hydrALAZINE (APRESOLINE) 50 MG tablet Take 1 tablet (50 mg total) by mouth every 8 (eight) hours. 06/25/20   Enzo Bi, MD  HYDROcodone-acetaminophen (NORCO/VICODIN) 5-325 MG tablet Take 1 tablet by mouth 3 (three) times daily as needed for moderate pain or severe pain. 12/17/20   Lorella Nimrod, MD  hydrOXYzine (ATARAX/VISTARIL) 25 MG tablet Take 25 mg by mouth daily.     [provider]  meclizine (ANTIVERT) 25 MG tablet Take 25 mg by mouth 4 (four) times daily as needed for dizziness.    [provider]  megestrol (MEGACE) 400 MG/10ML suspension Take 10 mLs (400 mg total) by mouth daily. 09/21/20   Shelly Coss, MD  multivitamin (RENA-VIT) TABS tablet Take 1 tablet by mouth daily.     [provider]  mupirocin ointment (BACTROBAN) 2 % Apply 1 application topically daily as needed (leg rash).  11/09/19   [provider]  nitroGLYCERIN (NITROSTAT) 0.4 MG SL tablet Place 0.4 mg under the tongue every 5 (five) minutes as needed for chest pain.     [provider]  Nutritional Supplements (FEEDING SUPPLEMENT, NEPRO CARB STEADY,) LIQD Take 237 mLs by mouth 2 (two) times daily between meals. 12/17/20   Lorella Nimrod, MD  Omega-3 300 MG CAPS Take 300 mg by mouth 2 (two) times daily. Every other day opposite omega XL    [provider]  ondansetron (ZOFRAN) 4 MG tablet Take  1 tablet (4 mg total) by mouth every 8 (eight) hours as needed for nausea or vomiting. 09/21/20 09/21/21  Shelly Coss, MD   pantoprazole (PROTONIX) 40 MG tablet Take 40 mg by mouth daily. 08/28/19   [provider]  polyethylene glycol (MIRALAX / GLYCOLAX) 17 g packet Take 17 g by mouth daily. 06/25/20   Enzo Bi, MD  Probiotic Product (PROBIOTIC PO) Take 1 capsule by mouth daily.    [provider]  sevelamer carbonate (RENVELA) 800 MG tablet Take 800 mg by mouth 4 (four) times daily. (at meals and with a snack)    [provider]    Allergies Ace inhibitors, Ativan [lorazepam], Compazine [prochlorperazine edisylate], Sumatriptan succinate, Zofran [ondansetron], Losartan, Prochlorperazine, Reglan [metoclopramide], Scopolamine, Tape, Tapentadol, and Ultrasound gel  Family History  Problem Relation Age of Onset   Kidney disease Mother    Diabetes Mother    Cancer Father    Kidney disease Sister     Social History Social History   Tobacco Use   Smoking status: Never   Smokeless tobacco: Never  Vaping Use   Vaping Use: Never used  Substance Use Topics   Alcohol use: Not Currently    Comment: glass wine week per pt   Drug use: Yes    Types: Marijuana    Comment: once a day    Review of Systems  Review of Systems  Constitutional:  Positive for malaise/fatigue (chronic). Negative for chills and fever.  HENT:  Negative for sore throat.   Eyes:  Negative for pain.  Respiratory:  Positive for shortness of breath (chronic). Negative for cough and stridor.   Cardiovascular:  Negative for chest pain.  Gastrointestinal:  Negative for vomiting.  Genitourinary:  Negative for dysuria.  Musculoskeletal:  Positive for joint pain ("for a long time in all my joints but especially my knees") and myalgias (b/l lower extremities, chronic).  Skin:  Negative for rash.  Neurological:  Negative for seizures, loss of consciousness and headaches.  Psychiatric/Behavioral:  Negative for suicidal ideas.   All other systems reviewed and are negative.     ____________________________________________   PHYSICAL EXAM:  VITAL SIGNS: ED Triage Vitals  Enc Vitals Group     BP 03/17/21 1908 135/67     Pulse Rate 03/17/21 1908 (!) 59     Resp 03/17/21 1908 (!) 22     Temp 03/17/21 1908 97.8 F (36.6 C)     Temp Source 03/17/21 1908 Oral     SpO2 03/17/21 1908 100 %     Weight 03/17/21 1904 176 lb 5.9 oz (80 kg)     Height 03/17/21 1904 5\' 5"  (1.651 m)     Head Circumference --      Peak Flow --      Pain Score 03/17/21 1909 10     Pain Loc --      Pain Edu? --      Excl. in [place]? --    Vitals:   03/17/21 2246 03/17/21 2247  BP: (!) 113/47   Pulse: (!) 53 (!) 53  Resp: 20   Temp:    SpO2: 100% 100%   Physical Exam Vitals and nursing note reviewed.  Constitutional:      General: She is not in acute distress.    Appearance: She is well-developed. She is obese.  HENT:     Head: Normocephalic and atraumatic.     Right Ear: External ear normal.     Left Ear: External ear normal.  Nose: Nose normal.  Eyes:     Conjunctiva/sclera: Conjunctivae normal.  Cardiovascular:     Rate and Rhythm: Normal rate and regular rhythm.     Heart sounds: No murmur heard. Pulmonary:     Effort: Pulmonary effort is normal. No respiratory distress.     Breath sounds: Normal breath sounds.  Abdominal:     General: There is distension.     Palpations: Abdomen is soft.     Tenderness: There is no abdominal tenderness.  Musculoskeletal:     Cervical back: Neck supple.     Right lower leg: Edema present.     Left lower leg: Edema present.  Skin:    General: Skin is warm and dry.  Neurological:     Mental Status: She is alert and oriented to person, place, and time.  Psychiatric:        Mood and Affect: Mood normal.    Patient has edema extending up to her abdomen.  There is a circular approximately 0.5 cm circular area of very superficial skin breakdown with some drainage of clear fluid.  No purulent drainage or bleeding.  No  surrounding induration, fluctuance, warmth or tenderness. ____________________________________________   LABS (all labs ordered are listed, but only abnormal results are displayed)  Labs Reviewed  CBC WITH DIFFERENTIAL/PLATELET - Abnormal; Notable for the following components:      Result Value   Hemoglobin 10.5 (*)    HCT 31.4 (*)    RDW 20.4 (*)    Eosinophils Absolute 1.3 (*)    All other components within normal limits  COMPREHENSIVE METABOLIC PANEL - Abnormal; Notable for the following components:   Sodium 132 (*)    Chloride 96 (*)    Creatinine, Ser 4.69 (*)    Calcium 8.6 (*)    Albumin 2.6 (*)    GFR, Estimated 10 (*)    All other components within normal limits  BRAIN NATRIURETIC PEPTIDE - Abnormal; Notable for the following components:   B Natriuretic Peptide 727.9 (*)    All other components within normal limits  PROTIME-INR - Abnormal; Notable for the following components:   Prothrombin Time 16.5 (*)    INR 1.3 (*)    All other components within normal limits  APTT - Abnormal; Notable for the following components:   aPTT 43 (*)    All other components within normal limits  RESP PANEL BY RT-PCR (FLU A&B, COVID) [id number]  TROPONIN I (HIGH SENSITIVITY)  TROPONIN I (HIGH SENSITIVITY)   ____________________________________________  EKG  Since with a ventricular rate of 60, first-degree AV block with a rate of 210, without clear evidence of acute ischemia.  Some nonspecific findings and anterior and inferior leads as well as overall low amplitude appear largely unchanged compared to ECG obtained on 5/27. ____________________________________________  RADIOLOGY  ED MD interpretation: Chest x-ray shows stable cardiomegaly with some mild edema without focal consolidation, large effusion or pneumothorax.  Official radiology report(s): US Venous Img Lower Bilateral (DVT)  Result Date: 03/17/2021 CLINICAL DATA:  Bilateral leg pain and swelling. EXAM: BILATERAL LOWER  EXTREMITY VENOUS DOPPLER ULTRASOUND TECHNIQUE: Gray-scale sonography with compression, as well as color and duplex ultrasound, were performed to evaluate the deep venous system(s) from the level of the common femoral vein through the popliteal and proximal calf veins. COMPARISON:  None. FINDINGS: VENOUS Normal compressibility of the BILATERAL common femoral, superficial femoral, and popliteal veins, as well as the visualized BILATERAL calf veins. Visualized portions of the BILATERAL profunda femoral veins  and BILATERAL great saphenous veins are unremarkable. No filling defects to suggest DVT on grayscale or color Doppler imaging. Doppler waveforms show normal direction of venous flow and normal respiratory plasticity. OTHER Marked severity diffuse bilateral lower extremity edema is seen. Limitations: It should be noted that the patient refused augmentation secondary to pain (as per the ultrasound technologist). IMPRESSION: 1. Limited study, as described above, without evidence of DVT within the RIGHT or LEFT lower extremity. 2. Diffuse bilateral lower extremity edema. Electronically Signed   By: Virgina Norfolk M.D.   On: 03/17/2021 22:55   DG Chest Port 1 View  Result Date: 03/17/2021 CLINICAL DATA:  Patient leaking fluid from right leg. End-stage renal disease. History of CHF. EXAM: PORTABLE CHEST 1 VIEW COMPARISON:  Feb 14, 2021 FINDINGS: The cardiomediastinal silhouette is stable. Diffuse bilateral interstitial opacities, similar in the interval. The dialysis catheter is stable terminating near the caval atrial junction and in the right atrium. No pneumothorax. No other changes. IMPRESSION: Findings are most consistent with CHF.  Stable left central line. Electronically Signed   By: Dorise Bullion III M.D   On: 03/17/2021 19:41    ____________________________________________   PROCEDURES  Procedure(s) performed (including Critical Care):  .1-3 Lead EKG Interpretation  Date/Time: 03/17/2021 11:44  PM Performed by: Lucrezia Starch, MD Authorized by: Lucrezia Starch, MD     Interpretation: abnormal     ECG rate assessment: bradycardic     Rhythm: sinus bradycardia     Ectopy: none     Conduction: abnormal     Abnormal conduction: 1st degree AV block     ____________________________________________   INITIAL IMPRESSION / ASSESSMENT AND PLAN / ED COURSE     Presents with above-stated history exam for assessment of some mild acute on chronic bilateral lower extremity pain associate with some drainage of clear fluid from the right upper leg after scab fell off yesterday.  She denies any associated trauma or other clear associated acute symptoms although he does endorse multiple chronic symptoms including pain in all her extremities, chronic shortness of breath chronic nausea and vomiting after dialysis.  She has not missed any dialysis sessions.  On arrival she is tachypneic at 20, bradycardic 53 with otherwise stable vital signs on chronic 2 L.  With regard to the spot on her right upper leg where she has been draining some clear fluid since yesterday suspect this is likely serous fluid from her edema leaking as she is fairly edematous on exam and the fluid is clear.  No evidence on exam of cellulitis or abscess and no history or exam features to suggest recent trauma.  Ultrasound bilateral lower extremity shows no evidence of DVT.  Patient denies any change in chronic shortness of breath she was not tachypneic in triage and thus a BNP and chest x-ray was obtained.  Chest x-ray shows some mild pulm edema BNP is elevated slightly above 700 where it was noted to be the same about 3 months ago when she was last admitted for heart failure exacerbation.  While patient sitting has an SPO2 of 100%, on trial of ambulation she maintains this above 95%.  Given absence of any acute on chronic hypoxic respiratory failure with patient denying any acute respiratory symptoms do not believe she requires  emergent diuresis in the emergency room or hospitalization for acute heart exacerbation but should follow-up with her cardiologist to see if she needs to adjust her diuretic regiment in the next 2 or 3 days.  ECG and nonelevated troponin are not suggestive of ACS.  Also advised her that she should schedule follow-up visit with her PCP to have her right upper extremity lesion checked to see if she needs any special dressings for what I suspect is a combination of very mild fluid volume overload and chronic lymphedema. Discharged stable condition.  Strict return precautions advised and discussed. ____________________________________________   FINAL CLINICAL IMPRESSION(S) / ED DIAGNOSES  Final diagnoses:  Bilateral leg edema  Fluid excess    Medications  acetaminophen (TYLENOL) tablet 1,000 mg (has no administration in time range)  furosemide (LASIX) injection 80 mg (has no administration in time range)     ED Discharge Orders     None        Note:  This document was prepared using Dragon voice recognition software and may include unintentional dictation errors.    Lucrezia Starch, MD 03/17/21 [phone]

## 2021-03-17 NOTE — ED Triage Notes (Signed)
Pt c/o bilateral knee pain, back pain and leg wound, pt states noted last night after a bath. Pt states noted "water running down her leg". Pt with bandage noted to R upper thigh and swelling noted to R upper thigh. Pt on chornic 2L [place].   Pt states had surgery on her leg where they took a vein for dialysis fistula. Pt states is a MWF dialysis patient, last dialysis on Friday.   Pt also c/o abd distention at this time.

## 2021-03-17 NOTE — ED Notes (Signed)
Per ems pt with bilateral leg swelling, pt is on 2lpm oxygen. 127/45 blood pressure, pt is due for dialysis tomorrow. Per ems pt already took daily medications. Pt also complains of leg pain per ems.

## 2021-03-17 NOTE — ED Notes (Signed)
Ultrasound with Pt at this time.

## 2021-03-17 NOTE — ED Notes (Signed)
Lab collected blood via venipuncture

## 2021-03-18 ENCOUNTER — Telehealth: Payer: Self-pay | Admitting: Primary Care

## 2021-03-18 NOTE — Telephone Encounter (Signed)
Attempted to reach patient to offer to schedule a Palliative Consult, no answer - left message requesting a return call, I let the patient know that the NP had an opening tomorrow afternoon, 03/19/21 @ 2:30 PM and asked her to call me back in the morning after 8 AM if she would like for NP to come out at that time.  Left my name and call back number.

## 2021-03-28 ENCOUNTER — Telehealth: Payer: Self-pay | Admitting: Primary Care

## 2021-03-28 NOTE — Telephone Encounter (Signed)
Attempted to contact patient to offer to schedule a Palliative Consult, no answer - left message with my name and call back number - requesting a return call to schedule visit.

## 2021-03-29 ENCOUNTER — Inpatient Hospital Stay
Admission: EM | Admit: 2021-03-29 | Discharge: 2021-04-02 | DRG: 689 | Disposition: A | Discharge status: Home Health | Payer: Medicare Other | Attending: Internal Medicine | Admitting: Internal Medicine

## 2021-03-29 ENCOUNTER — Other Ambulatory Visit: Payer: Self-pay

## 2021-03-29 ENCOUNTER — Emergency Department: Payer: Medicare Other

## 2021-03-29 DIAGNOSIS — E1140 Type 2 diabetes mellitus with diabetic neuropathy, unspecified: Secondary | ICD-10-CM | POA: Diagnosis present

## 2021-03-29 DIAGNOSIS — Z955 Presence of coronary angioplasty implant and graft: Secondary | ICD-10-CM | POA: Diagnosis not present

## 2021-03-29 DIAGNOSIS — G9341 Metabolic encephalopathy: Secondary | ICD-10-CM | POA: Diagnosis present

## 2021-03-29 DIAGNOSIS — A419 Sepsis, unspecified organism: Secondary | ICD-10-CM | POA: Diagnosis not present

## 2021-03-29 DIAGNOSIS — E1151 Type 2 diabetes mellitus with diabetic peripheral angiopathy without gangrene: Secondary | ICD-10-CM | POA: Diagnosis present

## 2021-03-29 DIAGNOSIS — I2510 Atherosclerotic heart disease of native coronary artery without angina pectoris: Secondary | ICD-10-CM | POA: Diagnosis present

## 2021-03-29 DIAGNOSIS — I340 Nonrheumatic mitral (valve) insufficiency: Secondary | ICD-10-CM | POA: Diagnosis present

## 2021-03-29 DIAGNOSIS — Z20822 Contact with and (suspected) exposure to covid-19: Secondary | ICD-10-CM | POA: Diagnosis present

## 2021-03-29 DIAGNOSIS — D696 Thrombocytopenia, unspecified: Secondary | ICD-10-CM | POA: Diagnosis present

## 2021-03-29 DIAGNOSIS — I959 Hypotension, unspecified: Secondary | ICD-10-CM | POA: Diagnosis present

## 2021-03-29 DIAGNOSIS — T82858A Stenosis of vascular prosthetic devices, implants and grafts, initial encounter: Secondary | ICD-10-CM | POA: Diagnosis present

## 2021-03-29 DIAGNOSIS — J449 Chronic obstructive pulmonary disease, unspecified: Secondary | ICD-10-CM | POA: Diagnosis present

## 2021-03-29 DIAGNOSIS — I252 Old myocardial infarction: Secondary | ICD-10-CM

## 2021-03-29 DIAGNOSIS — Y841 Kidney dialysis as the cause of abnormal reaction of the patient, or of later complication, without mention of misadventure at the time of the procedure: Secondary | ICD-10-CM | POA: Diagnosis present

## 2021-03-29 DIAGNOSIS — I5032 Chronic diastolic (congestive) heart failure: Secondary | ICD-10-CM | POA: Diagnosis present

## 2021-03-29 DIAGNOSIS — D631 Anemia in chronic kidney disease: Secondary | ICD-10-CM | POA: Diagnosis present

## 2021-03-29 DIAGNOSIS — E1122 Type 2 diabetes mellitus with diabetic chronic kidney disease: Secondary | ICD-10-CM | POA: Diagnosis present

## 2021-03-29 DIAGNOSIS — I248 Other forms of acute ischemic heart disease: Secondary | ICD-10-CM | POA: Diagnosis present

## 2021-03-29 DIAGNOSIS — E11649 Type 2 diabetes mellitus with hypoglycemia without coma: Secondary | ICD-10-CM | POA: Diagnosis present

## 2021-03-29 DIAGNOSIS — N186 End stage renal disease: Secondary | ICD-10-CM | POA: Diagnosis present

## 2021-03-29 DIAGNOSIS — Z91048 Other nonmedicinal substance allergy status: Secondary | ICD-10-CM | POA: Diagnosis not present

## 2021-03-29 DIAGNOSIS — M25511 Pain in right shoulder: Secondary | ICD-10-CM | POA: Diagnosis present

## 2021-03-29 DIAGNOSIS — E1143 Type 2 diabetes mellitus with diabetic autonomic (poly)neuropathy: Secondary | ICD-10-CM | POA: Diagnosis present

## 2021-03-29 DIAGNOSIS — R4182 Altered mental status, unspecified: Secondary | ICD-10-CM

## 2021-03-29 DIAGNOSIS — H9190 Unspecified hearing loss, unspecified ear: Secondary | ICD-10-CM | POA: Diagnosis present

## 2021-03-29 DIAGNOSIS — Z79899 Other long term (current) drug therapy: Secondary | ICD-10-CM

## 2021-03-29 DIAGNOSIS — I132 Hypertensive heart and chronic kidney disease with heart failure and with stage 5 chronic kidney disease, or end stage renal disease: Secondary | ICD-10-CM | POA: Diagnosis present

## 2021-03-29 DIAGNOSIS — Z992 Dependence on renal dialysis: Secondary | ICD-10-CM | POA: Diagnosis not present

## 2021-03-29 DIAGNOSIS — R509 Fever, unspecified: Secondary | ICD-10-CM

## 2021-03-29 DIAGNOSIS — Z833 Family history of diabetes mellitus: Secondary | ICD-10-CM

## 2021-03-29 DIAGNOSIS — K219 Gastro-esophageal reflux disease without esophagitis: Secondary | ICD-10-CM | POA: Diagnosis present

## 2021-03-29 DIAGNOSIS — E119 Type 2 diabetes mellitus without complications: Secondary | ICD-10-CM

## 2021-03-29 DIAGNOSIS — R52 Pain, unspecified: Secondary | ICD-10-CM

## 2021-03-29 DIAGNOSIS — N2581 Secondary hyperparathyroidism of renal origin: Secondary | ICD-10-CM | POA: Diagnosis present

## 2021-03-29 DIAGNOSIS — N390 Urinary tract infection, site not specified: Secondary | ICD-10-CM

## 2021-03-29 DIAGNOSIS — Z888 Allergy status to other drugs, medicaments and biological substances status: Secondary | ICD-10-CM | POA: Diagnosis not present

## 2021-03-29 DIAGNOSIS — I10 Essential (primary) hypertension: Secondary | ICD-10-CM | POA: Diagnosis present

## 2021-03-29 DIAGNOSIS — Z85528 Personal history of other malignant neoplasm of kidney: Secondary | ICD-10-CM

## 2021-03-29 DIAGNOSIS — Z841 Family history of disorders of kidney and ureter: Secondary | ICD-10-CM

## 2021-03-29 DIAGNOSIS — Z7982 Long term (current) use of aspirin: Secondary | ICD-10-CM

## 2021-03-29 DIAGNOSIS — K3184 Gastroparesis: Secondary | ICD-10-CM | POA: Diagnosis present

## 2021-03-29 LAB — CBC WITH DIFFERENTIAL/PLATELET
Abs Immature Granulocytes: 0.08 10*3/uL — ABNORMAL HIGH (ref 0.00–0.07)
Basophils Absolute: 0 10*3/uL (ref 0.0–0.1)
Basophils Relative: 0 %
Eosinophils Absolute: 0.3 10*3/uL (ref 0.0–0.5)
Eosinophils Relative: 2 %
HCT: 35.6 % — ABNORMAL LOW (ref 36.0–46.0)
Hemoglobin: 11.4 g/dL — ABNORMAL LOW (ref 12.0–15.0)
Immature Granulocytes: 1 %
Lymphocytes Relative: 7 %
Lymphs Abs: 1 10*3/uL (ref 0.7–4.0)
MCH: 27 pg (ref 26.0–34.0)
MCHC: 32 g/dL (ref 30.0–36.0)
MCV: 84.2 fL (ref 80.0–100.0)
Monocytes Absolute: 0.7 10*3/uL (ref 0.1–1.0)
Monocytes Relative: 5 %
Neutro Abs: 11.8 10*3/uL — ABNORMAL HIGH (ref 1.7–7.7)
Neutrophils Relative %: 85 %
Platelets: 138 10*3/uL — ABNORMAL LOW (ref 150–400)
RBC: 4.23 MIL/uL (ref 3.87–5.11)
RDW: 24.6 % — ABNORMAL HIGH (ref 11.5–15.5)
Smear Review: NORMAL
WBC: 14 10*3/uL — ABNORMAL HIGH (ref 4.0–10.5)
nRBC: 0 % (ref 0.0–0.2)

## 2021-03-29 LAB — PROTIME-INR
INR: 1.3 — ABNORMAL HIGH (ref 0.8–1.2)
Prothrombin Time: 16.1 seconds — ABNORMAL HIGH (ref 11.4–15.2)

## 2021-03-29 LAB — URINALYSIS, COMPLETE (UACMP) WITH MICROSCOPIC
Bilirubin Urine: NEGATIVE
Glucose, UA: >=500 mg/dL — AB
Ketones, ur: NEGATIVE mg/dL
Nitrite: NEGATIVE
Protein, ur: 100 mg/dL — AB
RBC / HPF: >50 RBC/hpf — ABNORMAL HIGH (ref 0–5)
Specific Gravity, Urine: 1.01 (ref 1.005–1.030)
Squamous Epithelial / HPF: NONE SEEN (ref 0–5)
WBC, UA: >50 WBC/hpf — ABNORMAL HIGH (ref 0–5)
pH: 5 (ref 5.0–8.0)

## 2021-03-29 LAB — RESP PANEL BY RT-PCR (FLU A&B, COVID) ARPGX2
Influenza A by PCR: NEGATIVE
Influenza B by PCR: NEGATIVE
SARS Coronavirus 2 by RT PCR: NEGATIVE

## 2021-03-29 LAB — LACTIC ACID, PLASMA: Lactic Acid, Venous: 1.7 mmol/L (ref 0.5–1.9)

## 2021-03-29 LAB — COMPREHENSIVE METABOLIC PANEL
ALT: 12 U/L (ref 0–44)
AST: 19 U/L (ref 15–41)
Albumin: 2.7 g/dL — ABNORMAL LOW (ref 3.5–5.0)
Alkaline Phosphatase: 104 U/L (ref 38–126)
Anion gap: 8 (ref 5–15)
BUN: 33 mg/dL — ABNORMAL HIGH (ref 8–23)
CO2: 28 mmol/L (ref 22–32)
Calcium: 8.6 mg/dL — ABNORMAL LOW (ref 8.9–10.3)
Chloride: 92 mmol/L — ABNORMAL LOW (ref 98–111)
Creatinine, Ser: 4.21 mg/dL — ABNORMAL HIGH (ref 0.44–1.00)
GFR, Estimated: 11 mL/min — ABNORMAL LOW (ref >60)
Glucose, Bld: 288 mg/dL — ABNORMAL HIGH (ref 70–99)
Potassium: 4 mmol/L (ref 3.5–5.1)
Sodium: 128 mmol/L — ABNORMAL LOW (ref 135–145)
Total Bilirubin: 0.8 mg/dL (ref 0.3–1.2)
Total Protein: 6.7 g/dL (ref 6.5–8.1)

## 2021-03-29 LAB — TROPONIN I (HIGH SENSITIVITY)
Troponin I (High Sensitivity): 30 ng/L — ABNORMAL HIGH (ref <18)
Troponin I (High Sensitivity): 26 ng/L — ABNORMAL HIGH (ref <18)

## 2021-03-29 LAB — BRAIN NATRIURETIC PEPTIDE: B Natriuretic Peptide: 1338 pg/mL — ABNORMAL HIGH (ref 0.0–100.0)

## 2021-03-29 LAB — CBG MONITORING, ED
Glucose-Capillary: 171 mg/dL — ABNORMAL HIGH (ref 70–99)
Glucose-Capillary: 259 mg/dL — ABNORMAL HIGH (ref 70–99)
Glucose-Capillary: 108 mg/dL — ABNORMAL HIGH (ref 70–99)

## 2021-03-29 LAB — HIV ANTIBODY (ROUTINE TESTING W REFLEX): HIV Screen 4th Generation wRfx: NONREACTIVE

## 2021-03-29 LAB — HEMOGLOBIN A1C
Hgb A1c MFr Bld: 6.2 % — ABNORMAL HIGH (ref 4.8–5.6)
Mean Plasma Glucose: 131.24 mg/dL

## 2021-03-29 LAB — PROCALCITONIN: Procalcitonin: 1.86 ng/mL

## 2021-03-29 LAB — GLUCOSE, CAPILLARY: Glucose-Capillary: 114 mg/dL — ABNORMAL HIGH (ref 70–99)

## 2021-03-29 LAB — MRSA NEXT GEN BY PCR, NASAL: MRSA by PCR Next Gen: NOT DETECTED

## 2021-03-29 LAB — APTT: aPTT: 34 seconds (ref 24–36)

## 2021-03-29 IMAGING — CR DG CHEST 2V
2 series · 2 of 2 positions shown · non-contrast
Comparison: 09/17/2020

CLINICAL DATA: Weakness, shortness of breath

EXAM:
CHEST - 2 VIEW

[chest lat]
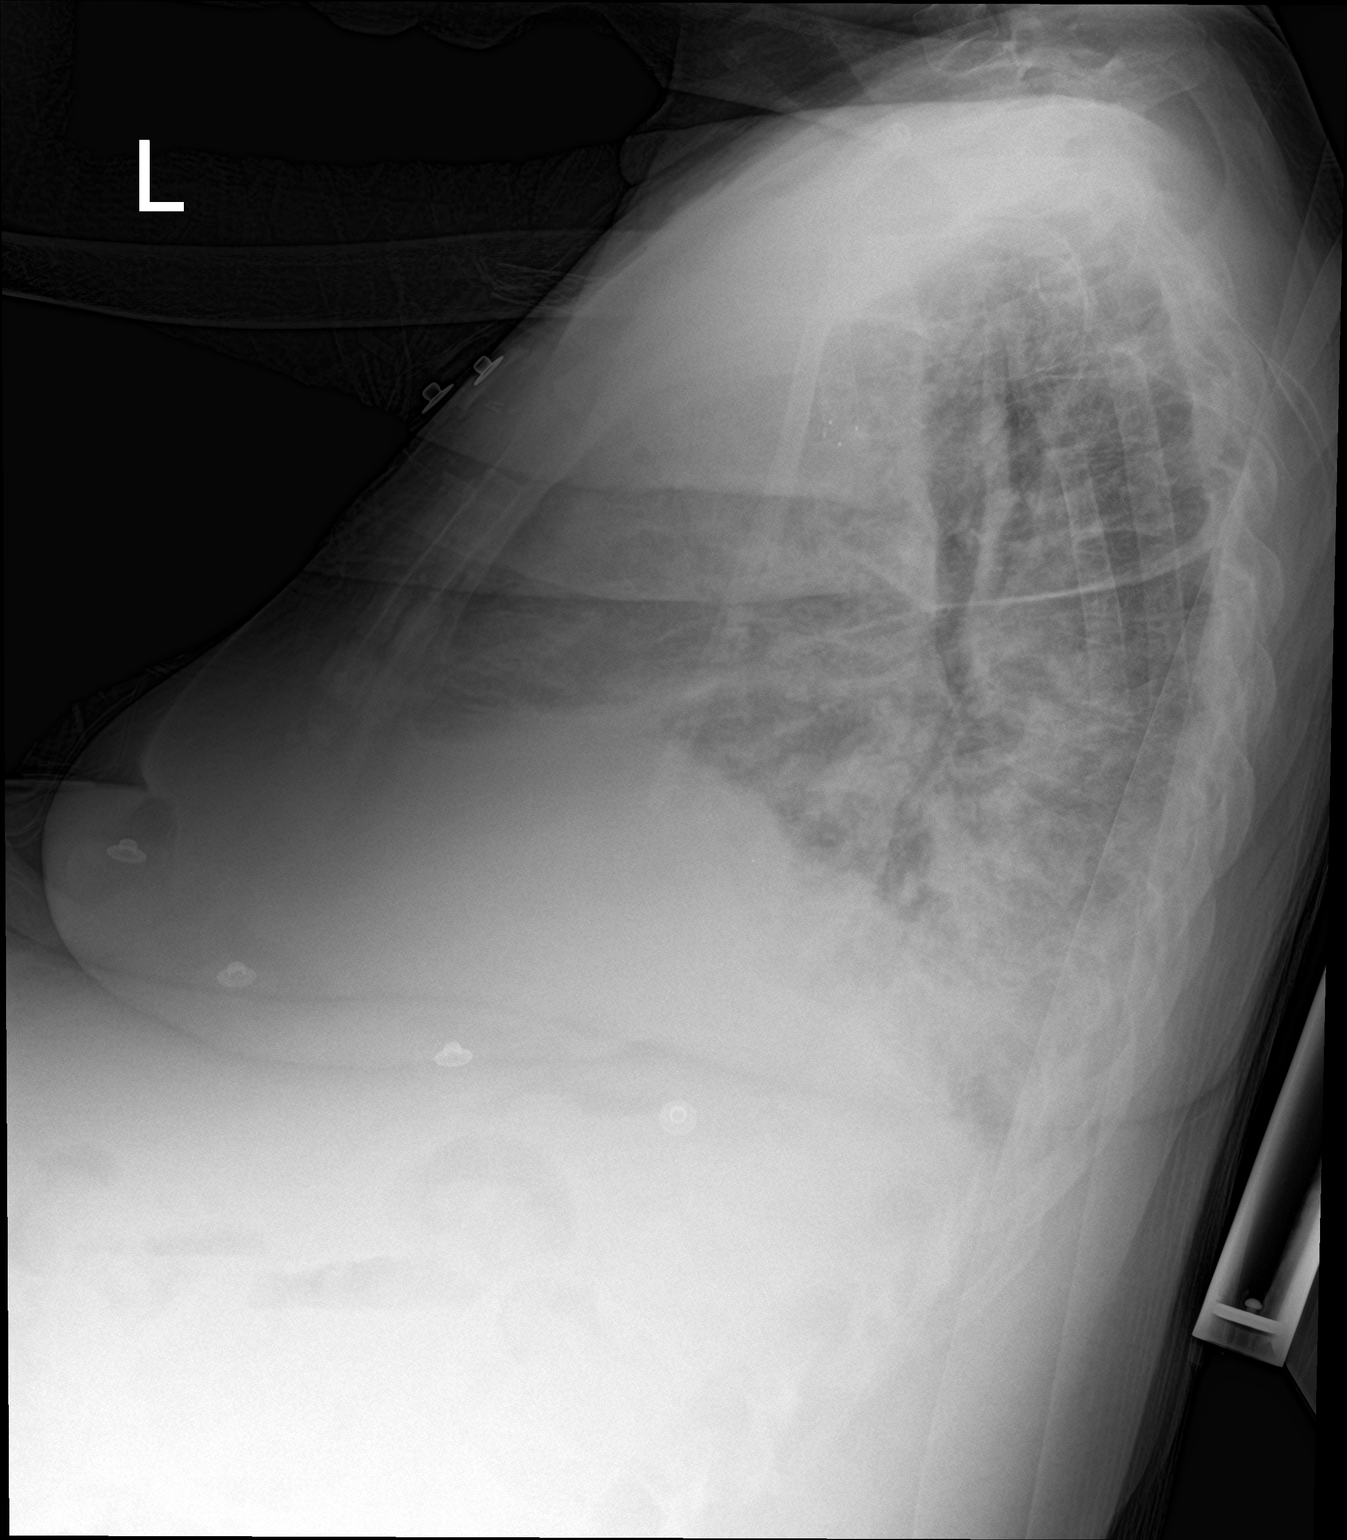

[chest ap]
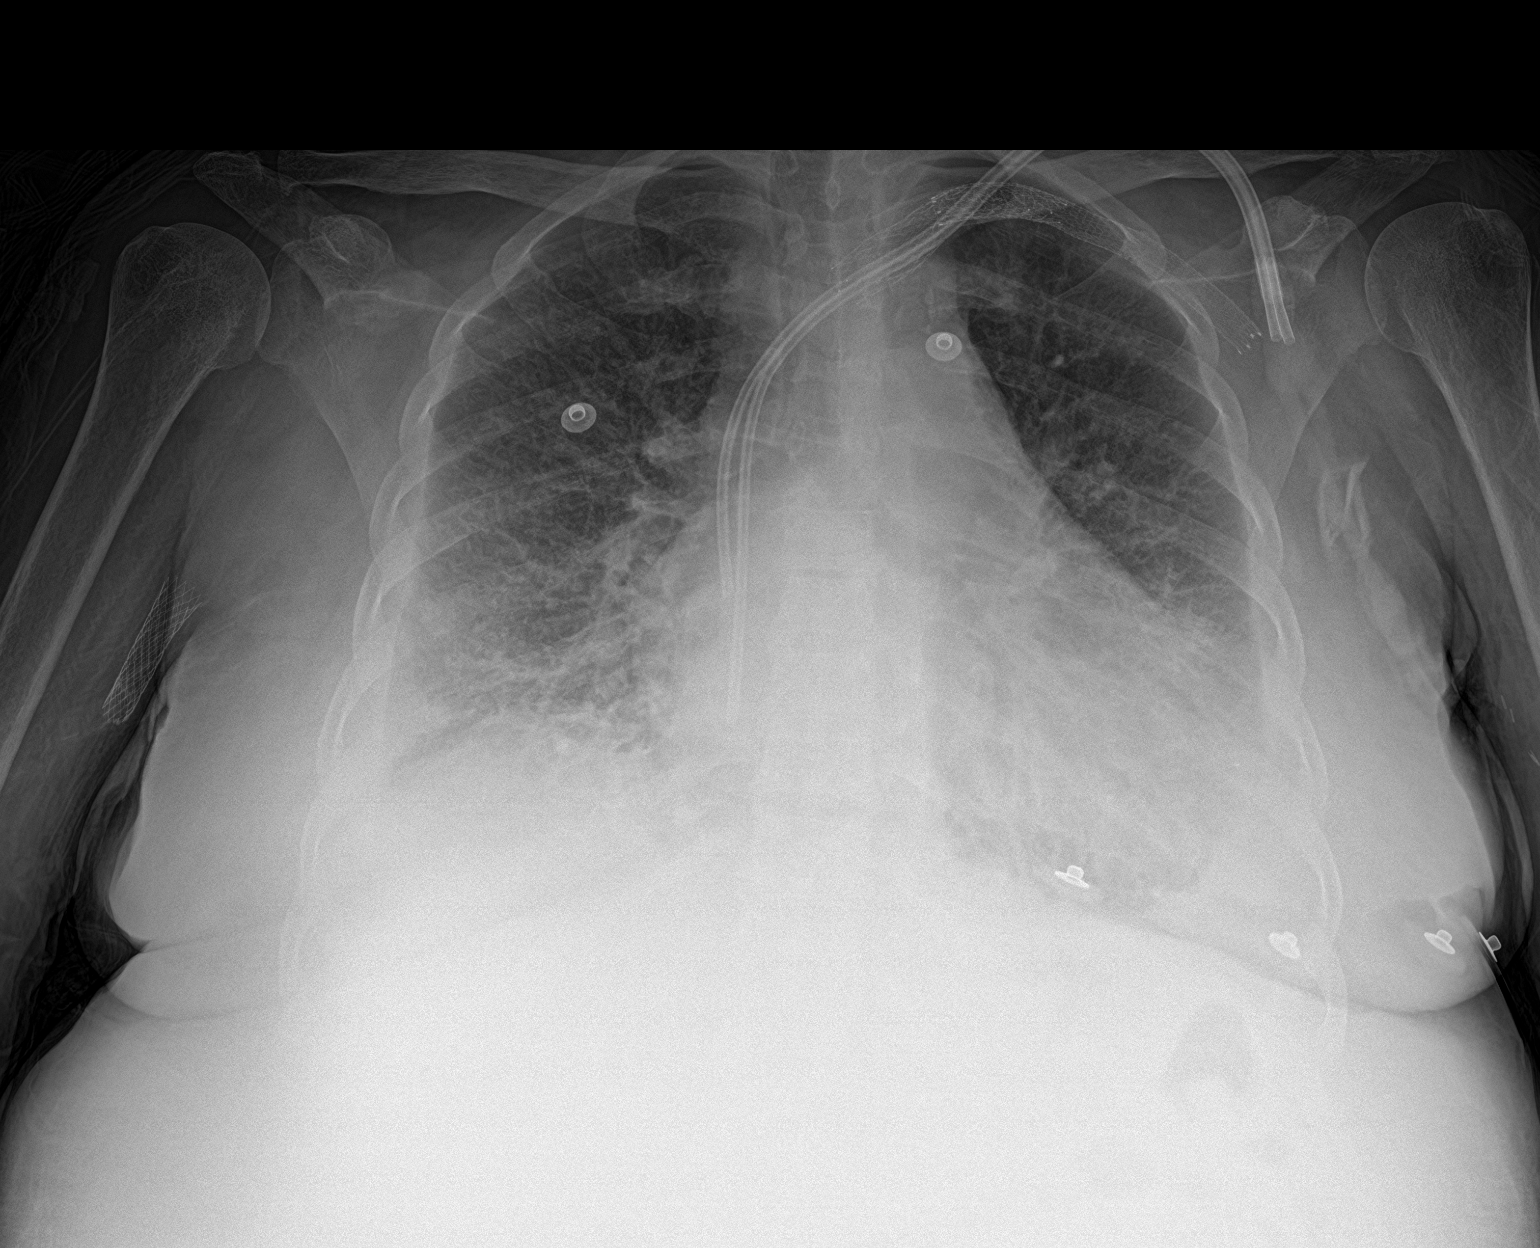

[2 of 2 positions shown; findings below may reference images not displayed]

FINDINGS: Left dialysis catheter remains in place, unchanged. Cardiomegaly.
Small bilateral pleural effusions. Diffuse interstitial prominence
throughout the lungs. Bibasilar airspace opacities. Findings are
similar to prior study. No acute bony abnormality.
IMPRESSION: Diffuse interstitial prominence and bibasilar opacities could
reflect edema.

Small bilateral pleural effusions.

## 2021-03-29 MED ORDER — RENA-VITE PO TABS
1.0000 | ORAL_TABLET | Freq: Every day | ORAL | Status: DC
  Administered 2021-03-29 – 2021-04-01 (×4): 1 via ORAL
  Filled 2021-03-29 (×5): qty 1

## 2021-03-29 MED ORDER — INSULIN ASPART 100 UNIT/ML IJ SOLN
0.0000 [IU] | Freq: Three times a day (TID) | INTRAMUSCULAR | Status: DC
  Administered 2021-03-29: 2 [IU] via SUBCUTANEOUS
  Administered 2021-03-30 – 2021-04-01 (×4): 1 [IU] via SUBCUTANEOUS
  Administered 2021-04-02: 3 [IU] via SUBCUTANEOUS
  Administered 2021-04-02: 1 [IU] via SUBCUTANEOUS
  Filled 2021-03-29 (×5): qty 1

## 2021-03-29 MED ORDER — VANCOMYCIN HCL 1750 MG/350ML IV SOLN
1750.0000 mg | Freq: Once | INTRAVENOUS | Status: AC
  Administered 2021-03-29: 1750 mg via INTRAVENOUS
  Filled 2021-03-29: qty 350

## 2021-03-29 MED ORDER — METRONIDAZOLE 500 MG/100ML IV SOLN
500.0000 mg | Freq: Once | INTRAVENOUS | Status: AC
  Administered 2021-03-29: 500 mg via INTRAVENOUS
  Filled 2021-03-29: qty 100

## 2021-03-29 MED ORDER — HEPARIN SODIUM (PORCINE) 5000 UNIT/ML IJ SOLN
5000.0000 [IU] | Freq: Three times a day (TID) | INTRAMUSCULAR | Status: DC
  Administered 2021-03-29 – 2021-04-02 (×12): 5000 [IU] via SUBCUTANEOUS
  Filled 2021-03-29 (×11): qty 1

## 2021-03-29 MED ORDER — HYDROCODONE-ACETAMINOPHEN 5-325 MG PO TABS
1.0000 | ORAL_TABLET | Freq: Four times a day (QID) | ORAL | Status: DC | PRN
  Administered 2021-03-30 – 2021-04-02 (×11): 1 via ORAL
  Filled 2021-03-29 (×12): qty 1

## 2021-03-29 MED ORDER — SODIUM CHLORIDE 0.9 % IV SOLN
1.0000 g | INTRAVENOUS | Status: DC
  Administered 2021-03-29: 1 g via INTRAVENOUS
  Filled 2021-03-29: qty 1

## 2021-03-29 MED ORDER — CHLORHEXIDINE GLUCONATE CLOTH 2 % EX PADS
6.0000 | MEDICATED_PAD | Freq: Every day | CUTANEOUS | Status: DC
  Administered 2021-03-31 – 2021-04-01 (×2): 6 via TOPICAL

## 2021-03-29 MED ORDER — ACETAMINOPHEN 325 MG PO TABS
650.0000 mg | ORAL_TABLET | Freq: Four times a day (QID) | ORAL | Status: DC | PRN
  Administered 2021-03-29 – 2021-04-02 (×4): 650 mg via ORAL
  Filled 2021-03-29 (×4): qty 2

## 2021-03-29 MED ORDER — ACETAMINOPHEN 500 MG PO TABS
1000.0000 mg | ORAL_TABLET | Freq: Once | ORAL | Status: AC
  Administered 2021-03-29: 1000 mg via ORAL
  Filled 2021-03-29: qty 2

## 2021-03-29 MED ORDER — SODIUM CHLORIDE 0.9 % IV BOLUS
500.0000 mL | Freq: Once | INTRAVENOUS | Status: AC
  Administered 2021-03-29: 500 mL via INTRAVENOUS

## 2021-03-29 MED ORDER — SODIUM CHLORIDE 0.9 % IV SOLN: 2.0000 g | Freq: Once | INTRAVENOUS | Status: DC

## 2021-03-29 MED ORDER — SODIUM CHLORIDE 0.9 % IV SOLN: INTRAVENOUS | Status: DC

## 2021-03-29 MED ORDER — VANCOMYCIN HCL 750 MG/150ML IV SOLN: 750.0000 mg | INTRAVENOUS | Status: DC

## 2021-03-29 MED ORDER — NEPRO/CARBSTEADY PO LIQD
237.0000 mL | Freq: Two times a day (BID) | ORAL | Status: DC
  Administered 2021-03-30: 237 mL via ORAL

## 2021-03-29 MED ORDER — SODIUM CHLORIDE 0.9 % IV SOLN
1.0000 g | INTRAVENOUS | Status: DC
  Administered 2021-03-29 – 2021-03-31 (×3): 1 g via INTRAVENOUS
  Filled 2021-03-29: qty 1
  Filled 2021-03-29 (×2): qty 10

## 2021-03-29 MED ORDER — HEPARIN SODIUM (PORCINE) 1000 UNIT/ML DIALYSIS
1000.0000 [IU] | INTRAMUSCULAR | Status: DC | PRN
  Administered 2021-03-29: 4600 [IU] via INTRAVENOUS_CENTRAL
  Administered 2021-03-29: 1000 [IU] via INTRAVENOUS_CENTRAL

## 2021-03-29 MED ORDER — ACETAMINOPHEN 650 MG RE SUPP: 650.0000 mg | Freq: Four times a day (QID) | RECTAL | Status: DC | PRN

## 2021-03-29 MED ORDER — VANCOMYCIN HCL IN DEXTROSE 1-5 GM/200ML-% IV SOLN: 1000.0000 mg | Freq: Once | INTRAVENOUS | Status: DC

## 2021-03-29 MED ORDER — INSULIN ASPART 100 UNIT/ML IJ SOLN: 0.0000 [IU] | Freq: Every day | INTRAMUSCULAR | Status: DC

## 2021-03-29 MED ORDER — HYDROXYZINE HCL 25 MG PO TABS
25.0000 mg | ORAL_TABLET | Freq: Three times a day (TID) | ORAL | Status: DC | PRN
  Administered 2021-03-30: 25 mg via ORAL
  Filled 2021-03-29 (×2): qty 1

## 2021-03-29 NOTE — Progress Notes (Signed)
CODE SEPSIS - PHARMACY COMMUNICATION  **Broad Spectrum Antibiotics should be administered within 1 hour of Sepsis diagnosis**  Time Code Sepsis Called/Page Received: 7/8 @ 0020  Antibiotics Ordered: Vanc, Cefepime  Time of 1st antibiotic administration: 7/8 @ 0104  Additional action taken by pharmacy:   If necessary, Name of Provider/Nurse Contacted:     Priscella Donna D ,PharmD Clinical Pharmacist  03/29/2021  1:13 AM

## 2021-03-29 NOTE — ED Provider Notes (Signed)
[facility] Emergency Department Provider Note  ____________________________________________   Event Date/Time   First MD Initiated Contact with Patient 03/29/21 0003     (approximate)  I have reviewed the triage vital signs and the nursing notes.   HISTORY  Chief Complaint Code Sepsis    HPI [name] is a 60 y.o. female with history of COPD on home oxygen as needed, CAD, hypertension, diabetes, CHF, end-stage renal disease on hemodialysis, renal cancer who presents to the emergency department EMS from home for concerns for decreased p.o. intake, confusion.  I spoke with patient's [name] who states that patient was complaining of leg pain today.  He does have history of neuropathy.  He states that she would not eat or drink and he was concerned because she had dialysis 2 days in a row on both Tuesday and Wednesday and thought she could be dehydrated.  He states she has been on dialysis for about 14 years.  She still makes urine intermittently.  She has been complaining of chills but no fever.  No cough.  No complaints of chest pain, shortness of breath, vomiting, diarrhea.  He reports she has recently been on steroids to help with her leg pain.  No other medication changes that he is aware of.  No known falls.  No COVID exposures that they are aware of but states there was a technician at the dialysis center that was sick.  States she is fully vaccinated against COVID-19 and influenza.  She does have history of renal cancer but states that she is not on any chemotherapy.  He reports the patient lives at home alone.  She ambulates with a cane, walker.  Reports she is normally oriented x4.  He reports she is a DNR but would want intubation if needed.  Patient found to have fever of 103 here.  Son was not aware that she was febrile.  Blood sugar in the 400s with EMS.     Past Medical History:  Diagnosis Date   Anemia    Anginal pain (HCC)    Anxiety    Arthritis     Asthma    Broken wrist    Bronchitis    chronic diastolic CHF 0/62/3762   Chronic kidney disease    COPD (chronic obstructive pulmonary disease) ([place])    Coronary artery disease    a. cath 2013: stenting to RCA (report not available); b. cath 2014: LM nl, pLAD 40%, mLAD nl, ost LCx 40%, mid LCx nl, pRCA 30% @ site of prior stent, mRCA 50%   Depression    Diabetes mellitus ([place])    Diabetes mellitus without complication ([place])    Diabetic neuropathy ([place])    dialysis 2006   Diverticulosis    Dizziness    Dyspnea    Elevated lipids    Environmental and seasonal allergies    ESRD (end stage renal disease) on dialysis ([place])    M-W-F   Gastroparesis    GERD (gastroesophageal reflux disease)    Headache    History of anemia due to chronic kidney disease    History of hiatal hernia    HOH (hard of hearing)    Hx of pancreatitis 2015   Hypertension    Lower extremity edema    Mitral regurgitation    a. echo 10/2013: EF 62%, noWMA, mildly dilated LA, mild to mod MR/TR, GR1DD   Myocardial infarction ([place])    Orthopnea    Parathyroid abnormality ([place])  Peripheral arterial disease (HCC)    Pneumonia    Renal cancer ([place])    Renal insufficiency    Pt is on dialysis on M,W + F.   Wheezing     Patient Active Problem List   Diagnosis Date Noted   Chronic respiratory failure with hypoxia ([place]) 12/18/2020   Superior vena caval syndrome 12/14/2020   Chronic diastolic CHF (congestive heart failure) ([place]) 12/04/2020   Bradycardia 09/17/2020   Advanced care planning/counseling discussion    Discitis of thoracic region    Fluid overload 08/10/2020   HLD (hyperlipidemia) 08/10/2020   Chest pain 08/10/2020   CAD (coronary artery disease) 08/10/2020   GERD (gastroesophageal reflux disease) 08/10/2020   Obesity (BMI 30-39.9) 06/06/2020   Anemia in ESRD (end-stage renal disease) ([place]) 05/31/2020   Type 1 diabetes mellitus ([place]) 05/31/2020   End-stage renal disease ([place]) 05/31/2020    Hypertensive disorder 05/31/2020   Arm pain 05/21/2020   Cellulitis of right leg 05/02/2020   ESRD (end stage renal disease) ([place]) 04/13/2020   SOBOE (shortness of breath on exertion) 02/08/2020   Hematoma of arm, left, subsequent encounter 11/24/2019   Respiratory failure (HCC)    Shortness of breath    Complication of arteriovenous dialysis fistula 11/13/2019   PVD (peripheral vascular disease) ([place]) 11/13/2019   Severe anemia 11/13/2019   Pressure injury of skin 11/13/2019   ESRD on hemodialysis ([place])    Hemorrhagic shock ([place]) 11/11/2019   Leg pain 10/02/2019   CAP (community acquired pneumonia) 10/01/2019   Atherosclerosis of native arteries of extremity with intermittent claudication ([place]) 05/01/2019   Right-sided headache    COPD (chronic obstructive pulmonary disease) ([place]) 10/06/2018   Syncope 10/04/2018   Symptomatic anemia 06/18/2018   Gastroparesis due to DM ([place]) 28/31/5176   Complication of vascular access for dialysis 12/04/2017   Osteomyelitis ([place]) 09/30/2017   Carotid stenosis 06/18/2017   Bilateral carotid artery stenosis 06/16/2017   Benign essential HTN 05/19/2017   CKD (chronic kidney disease) stage 5, GFR less than 15 ml/min (HCC) 05/19/2017   Hyperlipidemia, mixed 05/19/2017   Shortness of breath 05/04/2017   Rash, skin 12/16/2016   Cellulitis of lower extremity 07/29/2016   Chronic venous insufficiency 07/29/2016   Lymphedema 07/29/2016   TIA (transient ischemic attack) 04/21/2016   Altered mental status 04/08/2016   Hyperammonemia ([place]) 04/08/2016   Elevated troponin 04/08/2016   Depression 04/08/2016   Depression, major, recurrent, severe with psychosis ([place]) 04/08/2016   Blood in stool    Intractable cyclical vomiting with nausea    Reflux esophagitis    Gastritis    Generalized abdominal pain    Uncontrollable vomiting    Major depressive disorder, recurrent episode, moderate ([place]) 03/15/2016   Adjustment disorder with mixed anxiety and  depressed mood 03/15/2016   Malnutrition of moderate degree 12/01/2015   Renal mass    Dyspnea    Acute renal failure (HCC)    Respiratory failure ([place])    High temperature 11/14/2015   Encounter for central line placement    Encounter for orogastric (OG) tube placement    Nausea 11/12/2015   Hyperkalemia 10/03/2015   Diarrhea, unspecified 07/22/2015   Pneumonia 05/21/2015   Hypoglycemia 04/24/2015   Unresponsiveness 04/24/2015   Sinus bradycardia 04/24/2015   Hypothermia 04/24/2015   Acute respiratory failure ([place]) 04/24/2015   Acute on chronic diastolic CHF (congestive heart failure) ([place]) 04/05/2015   Diabetic gastroparesis ([place]) 04/05/2015   Hypokalemia 04/05/2015   Generalized weakness 04/05/2015   Acute pulmonary  edema ([place]) 04/03/2015   Nausea and vomiting 04/03/2015   Hypoglycemia associated with diabetes ([place]) 04/03/2015   Anemia of chronic disease 04/03/2015   Secondary hyperparathyroidism ([place]) 04/03/2015   Pressure ulcer 04/02/2015   Acute respiratory failure with hypoxia ([place]) 04/01/2015   Adjustment disorder with anxiety 03/14/2015   Somatic symptom disorder, mild 03/08/2015   Coronary artery disease involving native coronary artery of native heart without angina pectoris    Nausea & vomiting 03/06/2015   Abdominal pain 03/06/2015   Type 2 diabetes mellitus with hypoglycemia without coma, without long-term current use of insulin ([place]) 03/06/2015   HTN (hypertension) 03/06/2015   Gastroparesis 02/24/2015   Pleural effusion 02/19/2015   Bacteremia due to Enterococcus 02/19/2015   End-stage renal disease on hemodialysis ([place]) 02/19/2015   Diarrhea 08/01/2014    Past Surgical History:  Procedure Laterality Date   A/V SHUNTOGRAM Left 01/20/2018   Procedure: A/V SHUNTOGRAM;  Surgeon: Algernon Huxley, MD;  Location: [facility];  Service: Cardiovascular;  Laterality: Left;   ABDOMINAL HYSTERECTOMY  1992   AMPUTATION TOE Left 10/02/2017   Procedure:  AMPUTATION TOE-LEFT GREAT TOE;  Surgeon: Albertine Patricia, DPM;  Location: [facility];  Service: Podiatry;  Laterality: Left;   APPENDECTOMY     APPLICATION OF WOUND VAC N/A 11/25/2019   Procedure: APPLICATION OF WOUND VAC;  Surgeon: Katha Cabal, MD;  Location: [facility];  Service: Vascular;  Laterality: N/A;   ARTERY BIOPSY Right 10/11/2018   Procedure: BIOPSY TEMPORAL ARTERY;  Surgeon: Vickie Epley, MD;  Location: [facility];  Service: General;  Laterality: Right;   CARDIAC CATHETERIZATION Left 07/26/2015   Procedure: Left Heart Cath and Coronary Angiography;  Surgeon: Dionisio David, MD;  Location: [facility];  Service: Cardiovascular;  Laterality: Left;   CATARACT EXTRACTION W/ INTRAOCULAR LENS IMPLANT Right    CATARACT EXTRACTION W/PHACO Left 03/10/2017   Procedure: CATARACT EXTRACTION PHACO AND INTRAOCULAR LENS PLACEMENT ([place]);  Surgeon: Birder Robson, MD;  Location: [facility];  Service: Ophthalmology;  Laterality: Left;  [place] 00:51.9 AP% 14.2 CDE 7.39 fluid pack lot # [id number]   CENTRAL LINE INSERTION Right 11/11/2019   Procedure: CENTRAL LINE INSERTION;  Surgeon: Katha Cabal, MD;  Location: [facility];  Service: Vascular;  Laterality: Right;   CENTRAL LINE INSERTION  11/25/2019   Procedure: CENTRAL LINE INSERTION;  Surgeon: Katha Cabal, MD;  Location: [facility];  Service: Vascular;;   CHOLECYSTECTOMY     COLONOSCOPY WITH PROPOFOL N/A 08/12/2016   Procedure: COLONOSCOPY WITH PROPOFOL;  Surgeon: Lollie Sails, MD;  Location[facility] ENDOSCOPY;  Service: Endoscopy;  Laterality: N/A;   DIALYSIS FISTULA CREATION Left    upper arm   dialysis grafts     DIALYSIS/PERMA CATHETER INSERTION N/A 11/14/2019   Procedure: DIALYSIS/PERMA CATHETER INSERTION;  Surgeon: Algernon Huxley, MD;  Location: [facility];  Service: Cardiovascular;  Laterality: N/A;   DIALYSIS/PERMA CATHETER INSERTION N/A 02/03/2020   Procedure: DIALYSIS/PERMA CATHETER INSERTION;  Surgeon: Katha Cabal, MD;  Location: [facility];  Service: Cardiovascular;  Laterality: N/A;   DIALYSIS/PERMA CATHETER INSERTION N/A 06/19/2020   Procedure: DIALYSIS/PERMA CATHETER INSERTION;  Surgeon: Katha Cabal, MD;  Location: [facility];  Service: Cardiovascular;  Laterality: N/A;   DIALYSIS/PERMA CATHETER REMOVAL N/A 05/25/2020   Procedure: DIALYSIS/PERMA CATHETER REMOVAL;  Surgeon: Katha Cabal, MD;  Location: [facility];  Service: Cardiovascular;  Laterality: N/A;   ESOPHAGOGASTRODUODENOSCOPY N/A 03/08/2015  Procedure: ESOPHAGOGASTRODUODENOSCOPY (EGD);  Surgeon: Manya Silvas, MD;  Location: [facility] ENDOSCOPY;  Service: Endoscopy;  Laterality: N/A;   ESOPHAGOGASTRODUODENOSCOPY (EGD) WITH PROPOFOL N/A 03/18/2016   Procedure: ESOPHAGOGASTRODUODENOSCOPY (EGD) WITH PROPOFOL;  Surgeon: [name], MD;  Location: [facility];  Service: Endoscopy;  Laterality: N/A;   EYE SURGERY Right 2018   FECAL TRANSPLANT N/A 08/23/2015   Procedure: FECAL TRANSPLANT;  Surgeon: Manya Silvas, MD;  Location: [facility] ENDOSCOPY;  Service: Endoscopy;  Laterality: N/A;   HAND SURGERY Bilateral    HEMATOMA EVACUATION Left 11/25/2019   Procedure: EVACUATION HEMATOMA;  Surgeon: Katha Cabal, MD;  Location: [facility];  Service: Vascular;  Laterality: Left;   I & D EXTREMITY Left 11/25/2019   Procedure: IRRIGATION AND DEBRIDEMENT EXTREMITY;  Surgeon: Katha Cabal, MD;  Location: [facility];  Service: Vascular;  Laterality: Left;   IR FLUORO GUIDE CV LINE RIGHT  04/06/2020   IR INJECT/THERA/INC NEEDLE/CATH/PLC EPI/CERV/THOR W/IMG  08/13/2020   IR RADIOLOGIST EVAL & MGMT  07/28/2019   IR RADIOLOGIST EVAL & MGMT  08/11/2019   LIGATION OF ARTERIOVENOUS  FISTULA Left 11/11/2019   LIGATION OF ARTERIOVENOUS  FISTULA Left 11/11/2019   Procedure: LIGATION OF ARTERIOVENOUS  FISTULA;  Surgeon: Katha Cabal, MD;  Location: [facility];  Service: Vascular;  Laterality: Left;   LIGATIONS OF HERO  GRAFT Right 06/13/2020   Procedure: LIGATION / REMOVAL OF RIGHT HERO GRAFT;  Surgeon: Katha Cabal, MD;  Location: [facility];  Service: Vascular;  Laterality: Right;   PERIPHERAL VASCULAR CATHETERIZATION N/A 12/20/2015   Procedure: Thrombectomy of dialysis access versus permcath placement;  Surgeon: Algernon Huxley, MD;  Location: [facility];  Service: Cardiovascular;  Laterality: N/A;   PERIPHERAL VASCULAR CATHETERIZATION N/A 12/20/2015   Procedure: A/V Shunt Intervention;  Surgeon: Algernon Huxley, MD;  Location: [facility];  Service: Cardiovascular;  Laterality: N/A;   PERIPHERAL VASCULAR CATHETERIZATION N/A 12/20/2015   Procedure: A/V Shuntogram/Fistulagram;  Surgeon: Algernon Huxley, MD;  Location: [facility];  Service: Cardiovascular;  Laterality: N/A;   PERIPHERAL VASCULAR CATHETERIZATION N/A 01/02/2016   Procedure: A/V Shuntogram/Fistulagram;  Surgeon: Algernon Huxley, MD;  Location: [facility];  Service: Cardiovascular;  Laterality: N/A;   PERIPHERAL VASCULAR CATHETERIZATION N/A 01/02/2016   Procedure: A/V Shunt Intervention;  Surgeon: Algernon Huxley, MD;  Location: [facility];  Service: Cardiovascular;  Laterality: N/A;   TEE WITHOUT CARDIOVERSION N/A 06/11/2020   Procedure: TRANSESOPHAGEAL ECHOCARDIOGRAM (TEE);  Surgeon: Teodoro Spray, MD;  Location: [facility];  Service: Cardiovascular;  Laterality: N/A;   UPPER EXTREMITY ANGIOGRAPHY Bilateral 11/27/2020   Procedure: UPPER EXTREMITY ANGIOGRAPHY;  Surgeon: Katha Cabal, MD;  Location: [facility];  Service: Cardiovascular;  Laterality: Bilateral;   UPPER EXTREMITY VENOGRAPHY Right 01/18/2020   Procedure: UPPER EXTREMITY VENOGRAPHY;  Surgeon: Katha Cabal, MD;  Location: [facility];  Service: Cardiovascular;  Laterality: Right;   UPPER EXTREMITY VENOGRAPHY Bilateral 11/27/2020   Procedure: UPPER EXTREMITY VENOGRAPHY;  Surgeon: Katha Cabal, MD;  Location: [facility];   Service: Cardiovascular;  Laterality: Bilateral;   VASCULAR ACCESS DEVICE INSERTION Right 04/13/2020   Procedure: INSERTION OF HERO VASCULAR ACCESS DEVICE (GRAFT);  Surgeon: Katha Cabal, MD;  Location: [facility];  Service: Vascular;  Laterality: Right;    Prior to Admission medications   Medication Sig Start Date End Date Taking? Authorizing Provider  albuterol (PROVENTIL) (2.5 MG/3ML) 0.083% nebulizer solution  Take 2.5 mg by nebulization every 4 (four) hours as needed for wheezing or shortness of breath.    [provider]  albuterol (VENTOLIN HFA) 108 (90 Base) MCG/ACT inhaler Inhale 2 puffs into the lungs every 4 (four) hours as needed for wheezing or shortness of breath. 11/09/19   [provider]  alum & mag hydroxide-simeth (MAALOX/MYLANTA) 200-200-20 MG/5ML suspension Take 15 mLs by mouth every 6 (six) hours as needed for indigestion or heartburn. 06/25/20   Enzo Bi, MD  amLODipine (NORVASC) 10 MG tablet Take 1 tablet (10 mg total) by mouth daily. 06/25/20   Enzo Bi, MD  ammonium lactate (LAC-HYDRIN) 12 % lotion Apply 1 application topically 2 (two) times daily as needed for dry skin.     [provider]  Ascorbic Acid (VITA-C PO) Take 1 tablet by mouth daily.    [provider]  aspirin EC 81 MG tablet Take 81 mg by mouth daily.    [provider]  atorvastatin (LIPITOR) 20 MG tablet Take 20 mg by mouth every evening.  10/24/19   [provider]  calcium carbonate (TUMS - DOSED IN MG ELEMENTAL CALCIUM) 500 MG chewable tablet Chew 2.5 tablets (500 mg of elemental calcium total) by mouth every 6 (six) hours as needed for indigestion. 06/25/20   Enzo Bi, MD  Cholecalciferol (VITAMIN D3) 25 MCG (1000 UT) CAPS Take 1,000 Units by mouth daily.     [provider]  cholestyramine light (PREVALITE) 4 g packet Take 4 g by mouth 2 (two) times daily.    [provider]  CREON 12000 units CPEP capsule Take 36,000 Units by  mouth 3 (three) times daily before meals.  10/21/19   [provider]  cyanocobalamin 1000 MCG tablet Take 1,000 mcg by mouth daily.     [provider]  diphenhydrAMINE (BENADRYL) 25 MG tablet Take 25 mg by mouth every 6 (six) hours as needed for itching.    [provider]  epoetin alfa (EPOGEN) 10000 UNIT/ML injection Inject 1 mL (10,000 Units total) into the vein every Monday, Wednesday, and Friday with hemodialysis. 06/27/20   Enzo Bi, MD  Ferrous Sulfate (IRON) 325 (65 Fe) MG TABS Take 325 mg by mouth daily.     [provider]  furosemide (LASIX) 80 MG tablet Take 1 tablet by mouth daily. 08/13/20   [provider]  gabapentin (NEURONTIN) 100 MG capsule Take 100 mg by mouth every evening.     [provider]  hydrALAZINE (APRESOLINE) 50 MG tablet Take 1 tablet (50 mg total) by mouth every 8 (eight) hours. 06/25/20   Enzo Bi, MD  HYDROcodone-acetaminophen (NORCO/VICODIN) 5-325 MG tablet Take 1 tablet by mouth 3 (three) times daily as needed for moderate pain or severe pain. 12/17/20   Lorella Nimrod, MD  hydrOXYzine (ATARAX/VISTARIL) 25 MG tablet Take 25 mg by mouth daily.     [provider]  meclizine (ANTIVERT) 25 MG tablet Take 25 mg by mouth 4 (four) times daily as needed for dizziness.    [provider]  megestrol (MEGACE) 400 MG/10ML suspension Take 10 mLs (400 mg total) by mouth daily. 09/21/20   Shelly Coss, MD  multivitamin (RENA-VIT) TABS tablet Take 1 tablet by mouth daily.     [provider]  mupirocin ointment (BACTROBAN) 2 % Apply 1 application topically daily as needed (leg rash).  11/09/19   [provider]  nitroGLYCERIN (NITROSTAT) 0.4 MG SL tablet Place 0.4 mg under the tongue  every 5 (five) minutes as needed for chest pain.     [provider]  Nutritional Supplements (FEEDING SUPPLEMENT, NEPRO CARB STEADY,) LIQD Take 237 mLs by mouth 2 (two) times daily between meals.  12/17/20   Lorella Nimrod, MD  Omega-3 300 MG CAPS Take 300 mg by mouth 2 (two) times daily. Every other day opposite omega XL    [provider]  ondansetron (ZOFRAN) 4 MG tablet Take 1 tablet (4 mg total) by mouth every 8 (eight) hours as needed for nausea or vomiting. 09/21/20 09/21/21  Shelly Coss, MD  pantoprazole (PROTONIX) 40 MG tablet Take 40 mg by mouth daily. 08/28/19   [provider]  polyethylene glycol (MIRALAX / GLYCOLAX) 17 g packet Take 17 g by mouth daily. 06/25/20   Enzo Bi, MD  Probiotic Product (PROBIOTIC PO) Take 1 capsule by mouth daily.    [provider]  sevelamer carbonate (RENVELA) 800 MG tablet Take 800 mg by mouth 4 (four) times daily. (at meals and with a snack)    [provider]    Allergies Ace inhibitors, Ativan [lorazepam], Compazine [prochlorperazine edisylate], Sumatriptan succinate, Zofran [ondansetron], Losartan, Prochlorperazine, Reglan [metoclopramide], Scopolamine, Tape, Tapentadol, and Ultrasound gel  Family History  Problem Relation Age of Onset   Kidney disease Mother    Diabetes Mother    Cancer Father    Kidney disease Sister     Social History Social History   Tobacco Use   Smoking status: Never   Smokeless tobacco: Never  Vaping Use   Vaping Use: Never used  Substance Use Topics   Alcohol use: Not Currently    Comment: glass wine week per pt   Drug use: Yes    Types: Marijuana    Comment: once a day    Review of Systems Level 5 caveat due to altered mental status  ____________________________________________   PHYSICAL EXAM:  VITAL SIGNS: ED Triage Vitals [03/29/21 0009]  Enc Vitals Group     BP      Pulse      Resp      Temp (!) 103.2 F (39.6 C)     Temp Source Oral     SpO2      Weight      Height      Head Circumference      Peak Flow      Pain Score      Pain Loc      Pain Edu?      Excl. in [place]?    CONSTITUTIONAL: Alert and oriented to person and place.  States  it is 2021.  Appears older than stated age.  Chronically ill-appearing.  Denies any pain.  Moaning intermittently stating that she is cold. HEAD: Normocephalic EYES: Conjunctivae clear, pupils appear equal, EOM appear intact ENT: normal nose; moist mucous membranes NECK: Supple, normal ROM, no meningismus CARD: RRR; S1 and S2 appreciated; no murmurs, no clicks, no rubs, no gallops CHEST: Patient has a tunneled catheter in the left chest wall without surrounding redness, warmth, bleeding or drainage.  Appears to be nontender around this area. RESP: Normal chest excursion without splinting or tachypnea; breath sounds clear and equal bilaterally; no wheezes, no rhonchi, no rales, no hypoxia or respiratory distress, speaking full sentences ABD/GI: Normal bowel sounds; non-distended; soft, non-tender, no rebound, no guarding, no peritoneal signs, no hepatosplenomegaly BACK: The back appears normal EXT: Normal ROM in all joints; no deformity noted, diffuse peripheral edema noted in her bilateral upper and  lower extremities SKIN: Normal color for age and race; warm; no rash on exposed skin NEURO: Moves all extremities equally, normal speech, no facial asymmetry, has a difficult time following commands and answering questions PSYCH: The patient's mood and manner are appropriate.  ____________________________________________   LABS (all labs ordered are listed, but only abnormal results are displayed)  Labs Reviewed  COMPREHENSIVE METABOLIC PANEL - Abnormal; Notable for the following components:      Result Value   Sodium 128 (*)    Chloride 92 (*)    Glucose, Bld 288 (*)    BUN 33 (*)    Creatinine, Ser 4.21 (*)    Calcium 8.6 (*)    Albumin 2.7 (*)    GFR, Estimated 11 (*)    All other components within normal limits  CBC WITH DIFFERENTIAL/PLATELET - Abnormal; Notable for the following components:   WBC 14.0 (*)    Hemoglobin 11.4 (*)    HCT 35.6 (*)    RDW 24.6 (*)    Platelets 138 (*)     Neutro Abs 11.8 (*)    Abs Immature Granulocytes 0.08 (*)    All other components within normal limits  URINALYSIS, COMPLETE (UACMP) WITH MICROSCOPIC - Abnormal; Notable for the following components:   Color, Urine YELLOW (*)    APPearance TURBID (*)    Glucose, UA >=500 (*)    Hgb urine dipstick MODERATE (*)    Protein, ur 100 (*)    Leukocytes,Ua LARGE (*)    RBC / HPF >50 (*)    WBC, UA >50 (*)    Bacteria, UA MANY (*)    All other components within normal limits  BRAIN NATRIURETIC PEPTIDE - Abnormal; Notable for the following components:   B Natriuretic Peptide 1,338.0 (*)    All other components within normal limits  PROTIME-INR - Abnormal; Notable for the following components:   Prothrombin Time 16.1 (*)    INR 1.3 (*)    All other components within normal limits  TROPONIN I (HIGH SENSITIVITY) - Abnormal; Notable for the following components:   Troponin I (High Sensitivity) 30 (*)    All other components within normal limits  RESP PANEL BY RT-PCR (FLU A&B, COVID) ARPGX2  CULTURE, BLOOD (ROUTINE X 2)  CULTURE, BLOOD (ROUTINE X 2)  URINE CULTURE  LACTIC ACID, PLASMA  PROCALCITONIN  APTT  CBG MONITORING, ED  TROPONIN I (HIGH SENSITIVITY)   ____________________________________________  EKG   EKG Interpretation  Date/Time:  Friday March 29 2021 00:13:50 EDT Ventricular Rate:  93 PR Interval:  141 QRS Duration: 95 QT Interval:  351 QTC Calculation: 437 R Axis:   103 Text Interpretation: Sinus rhythm Right axis deviation Low voltage, extremity leads Confirmed by Pryor Curia [phone]) on 03/29/2021 12:28:47 AM         ____________________________________________  RADIOLOGY Jessie Foot Cina Klumpp, personally viewed and evaluated these images (plain radiographs) as part of my medical decision making, as well as reviewing the written report by the radiologist.  ED MD interpretation: CT head shows no acute abnormality.  Chest x-ray shows atelectasis versus bibasilar  opacities.  She has pulmonary vascular congestion without edema.  Official radiology report(s): CT Head Wo Contrast  Result Date: 03/29/2021 CLINICAL DATA:  Altered mental status EXAM: CT HEAD WITHOUT CONTRAST TECHNIQUE: Contiguous axial images were obtained from the base of the skull through the vertex without intravenous contrast. COMPARISON:  09/17/2020 FINDINGS: Brain: No evidence of acute infarction, hemorrhage, hydrocephalus, extra-axial collection or mass lesion/mass effect.  Vascular: No hyperdense vessel or unexpected calcification. Skull: Normal. Negative for fracture or focal lesion. Sinuses/Orbits: The visualized paranasal sinuses are essentially clear. The mastoid air cells are unopacified. Other: None. IMPRESSION: Normal head CT. Electronically Signed   By: Julian Hy M.D.   On: 03/29/2021 01:12   DG Chest Port 1 View  Result Date: 03/29/2021 CLINICAL DATA:  Sepsis EXAM: PORTABLE CHEST 1 VIEW COMPARISON:  03/17/2021 FINDINGS: Cardiomegaly with pulmonary vascular congestion. No frank interstitial edema. Mild bibasilar opacities, likely atelectasis. Suspected small left pleural effusion. No pneumothorax. Left IJ dual lumen dialysis catheter terminating in the upper right atrium. Left subclavian stent. IMPRESSION: Cardiomegaly with pulmonary vascular congestion. No frank interstitial edema. Small left pleural effusion. Mild bibasilar opacities, likely atelectasis. Electronically Signed   By: Julian Hy M.D.   On: 03/29/2021 00:44    ____________________________________________   PROCEDURES  Procedure(s) performed (including Critical Care):  Procedures  CRITICAL CARE Performed by: Cyril Mourning Leelynd Maldonado   Total critical care time: 65 minutes  Critical care time was exclusive of separately billable procedures and treating other patients.  Critical care was necessary to treat or prevent imminent or life-threatening deterioration.  Critical care was time spent personally by me  on the following activities: development of treatment plan with patient and/or surrogate as well as nursing, discussions with consultants, evaluation of patient's response to treatment, examination of patient, obtaining history from patient or surrogate, ordering and performing treatments and interventions, ordering and review of laboratory studies, ordering and review of radiographic studies, pulse oximetry and re-evaluation of patient's condition.  ____________________________________________   INITIAL IMPRESSION / ASSESSMENT AND PLAN / ED COURSE  As part of my medical decision making, I reviewed the following data within the [place] History obtained from family, Nursing notes reviewed and incorporated, Labs reviewed , EKG interpreted , Old EKG reviewed, Old chart reviewed, Radiograph reviewed , Discussed with admitting physician , and Notes from prior ED visits         Patient here with altered mental status, fever.  Blood sugar found to be in the 400s with EMS.  She has recently been on steroids per patient's son.  She denies any pain at this time.  She is complaining of feeling cold.  Will give Tylenol for fever.  Otherwise hemodynamically stable.  She is high risk for sepsis, bacteremia given she is on dialysis.  Will obtain labs, urine, chest x-ray, cultures.  Will give broad-spectrum antibiotics.  We will hold on IV fluids at this time given she appears volume overloaded and is currently hypertensive.  Lactic is pending.  I feel she will need admission.  ED PROGRESS  Labs show leukocytosis of 14,000 with left shift.  Blood glucose now 288.  Patient is not in DKA.  Lactic normal.  We will continue to hold IV fluids.  Chest x-ray shows bibasilar atelectasis versus opacities.  COVID and flu negative.  Patient's urine appears infected.  Urine culture pending.  CT head shows no acute abnormality.  Patient continues to be hemodynamically stable.  Will discuss with hospitalist  for admission.  2:11 AM Discussed patient's case with hospitalist, Dr. Damita Dunnings.  I have recommended admission and patient (and family if present) agree with this plan. Admitting physician will place admission orders.   I reviewed all nursing notes, vitals, pertinent previous records and reviewed/interpreted all EKGs, lab and urine results, imaging (as available).  ____________________________________________   FINAL CLINICAL IMPRESSION(S) / ED DIAGNOSES  Final diagnoses:  Fever, unspecified fever  cause  Altered mental status, unspecified altered mental status type  Acute UTI     ED Discharge Orders     None       *Please note:  LESLEA [name] was evaluated in Emergency Department on 03/29/2021 for the symptoms described in the history of present illness. She was evaluated in the context of the global COVID-19 pandemic, which necessitated consideration that the patient might be at risk for infection with the SARS-CoV-2 virus that causes COVID-19. Institutional protocols and algorithms that pertain to the evaluation of patients at risk for COVID-19 are in a state of rapid change based on information released by regulatory bodies including the CDC and federal and state organizations. These policies and algorithms were followed during the patient's care in the ED.  Some ED evaluations and interventions may be delayed as a result of limited staffing during and the pandemic.*   Note:  This document was prepared using Dragon voice recognition software and may include unintentional dictation errors.    Krizia Flight, Delice Bison, DO 03/29/21 0211

## 2021-03-29 NOTE — Progress Notes (Addendum)
[facility]  ROUNDING NOTE   Subjective:   [name] is a 60 y.o. female with a past medical history of diabetes, dCHF, CAD, PVD, and ESRD on dialysis. She presents to the ED with complaints of confusion and poor intake.   Patient is a current patient of [facility], receiving outpatient dialysis at [facility] with Dr Holley Raring. She missed her Monday treatment, but received dialysis on Tuesday and Wednesday this week. Patient is resting quietly in bed on arrival. Alert and oriented x 3, lacks situation. She states she is not confused and that her son is the one that is confused. She denies shortness of breath. Denies changes in appetite.    Objective:  Vital signs in last 24 hours:  Temp:  [99.5 F (37.5 C)-103.2 F (39.6 C)] 99.5 F (37.5 C) (07/08 1332) Pulse Rate:  [73-97] 77 (07/08 1332) Resp:  [14-18] 17 (07/08 1332) BP: (132-171)/(49-76) 138/60 (07/08 1332) SpO2:  [93 %-99 %] 99 % (07/08 1332) Weight:  [79.3 kg] 79.3 kg (07/08 0037)  Weight change:  Filed Weights   03/29/21 0037  Weight: 79.3 kg    Intake/Output: I/O last 3 completed shifts: In: 100 [IV Piggyback:100] Out: 15 [Urine:15]   Intake/Output this shift:  No intake/output data recorded.  Physical Exam: General: NAD, resting in bed  Head: Normocephalic, atraumatic. Moist oral mucosal membranes  Eyes: Anicteric  Lungs:  Crackles to auscultation, normal effort  Heart: Regular rate and rhythm  Abdomen:  Soft, nontender  Extremities:  2+ peripheral edema.  Neurologic: Nonfocal, moving all four extremities  Skin: No lesions  Access: Lt IJ Permcath    Basic Metabolic Panel: Recent Labs  Lab 03/29/21 0025  NA 128*  K 4.0  CL 92*  CO2 28  GLUCOSE 288*  BUN 33*  CREATININE 4.21*  CALCIUM 8.6*    Liver Function Tests: Recent Labs  Lab 03/29/21 0025  AST 19  ALT 12  ALKPHOS 104  BILITOT 0.8  PROT 6.7  ALBUMIN 2.7*   No results for input(s): LIPASE, AMYLASE in the last 168  hours. No results for input(s): AMMONIA in the last 168 hours.  CBC: Recent Labs  Lab 03/29/21 0025  WBC 14.0*  NEUTROABS 11.8*  HGB 11.4*  HCT 35.6*  MCV 84.2  PLT 138*    Cardiac Enzymes: No results for input(s): CKTOTAL, CKMB, CKMBINDEX, TROPONINI in the last 168 hours.  BNP: Invalid input(s): POCBNP  CBG: Recent Labs  Lab 03/29/21 0036 03/29/21 0847 03/29/21 1236  GLUCAP 259* 171* 108*    Microbiology: Results for orders placed or performed during the hospital encounter of 03/29/21  Resp Panel by RT-PCR (Flu A&B, Covid) Nasopharyngeal Swab     Status: None   Collection Time: 03/29/21 12:25 AM   Specimen: Nasopharyngeal Swab; Nasopharyngeal(NP) swabs in vial transport medium  Result Value Ref Range Status   SARS Coronavirus 2 by RT PCR NEGATIVE NEGATIVE Final    Comment: (NOTE) SARS-CoV-2 target nucleic acids are NOT DETECTED.  The SARS-CoV-2 RNA is generally detectable in upper respiratory specimens during the acute phase of infection. The lowest concentration of SARS-CoV-2 viral copies this assay can detect is 138 copies/mL. A negative result does not preclude SARS-Cov-2 infection and should not be used as the sole basis for treatment or other patient management decisions. A negative result may occur with  improper specimen collection/handling, submission of specimen other than nasopharyngeal swab, presence of viral mutation(s) within the areas targeted by this assay, and inadequate number  of viral copies(<138 copies/mL). A negative result must be combined with clinical observations, patient history, and epidemiological information. The expected result is Negative.  Fact Sheet for Patients:  EntrepreneurPulse.com.au  Fact Sheet for Healthcare Providers:  IncredibleEmployment.be  This test is no t yet approved or cleared by the [place] FDA and  has been authorized for detection and/or diagnosis of SARS-CoV-2  by FDA under an Emergency Use Authorization (EUA). This EUA will remain  in effect (meaning this test can be used) for the duration of the COVID-19 declaration under Section 564(b)(1) of the Act, 21 U.S.C.section 360bbb-3(b)(1), unless the authorization is terminated  or revoked sooner.       Influenza A by PCR NEGATIVE NEGATIVE Final   Influenza B by PCR NEGATIVE NEGATIVE Final    Comment: (NOTE) The Xpert Xpress SARS-CoV-2/FLU/RSV plus assay is intended as an aid in the diagnosis of influenza from Nasopharyngeal swab specimens and should not be used as a sole basis for treatment. Nasal washings and aspirates are unacceptable for Xpert Xpress SARS-CoV-2/FLU/RSV testing.  Fact Sheet for Patients: EntrepreneurPulse.com.au  Fact Sheet for Healthcare Providers: IncredibleEmployment.be  This test is not yet approved or cleared by the [place] FDA and has been authorized for detection and/or diagnosis of SARS-CoV-2 by FDA under an Emergency Use Authorization (EUA). This EUA will remain in effect (meaning this test can be used) for the duration of the COVID-19 declaration under Section 564(b)(1) of the Act, 21 U.S.C. section 360bbb-3(b)(1), unless the authorization is terminated or revoked.  Performed at [facility], [address] [place], [place] [address]   Blood Culture (routine x 2)     Status: None (Preliminary result)   Collection Time: 03/29/21 12:28 AM   Specimen: BLOOD  Result Value Ref Range Status   Specimen Description BLOOD LEFT FOREARM  Final   Special Requests   Final    BOTTLES DRAWN AEROBIC AND ANAEROBIC Blood Culture adequate volume   Culture   Final    NO GROWTH < 12 HOURS Performed at [facility], [address] [place], [place] [address]    Report Status PENDING  Incomplete  Blood Culture (routine x 2)     Status: None (Preliminary result)   Collection Time: 03/29/21 12:28 AM   Specimen:  BLOOD  Result Value Ref Range Status   Specimen Description BLOOD LEFT THUMB  Final   Special Requests   Final    BOTTLES DRAWN AEROBIC AND ANAEROBIC Blood Culture results may not be optimal due to an inadequate volume of blood received in culture bottles   Culture   Final    NO GROWTH < 12 HOURS Performed at [facility], [address], [place], [place] [address]    Report Status PENDING  Incomplete    Coagulation Studies: Recent Labs    03/29/21 0021  LABPROT 16.1*  INR 1.3*    Urinalysis: Recent Labs    03/29/21 0020  COLORURINE YELLOW*  LABSPEC 1.010  PHURINE 5.0  GLUCOSEU >=500*  HGBUR MODERATE*  BILIRUBINUR NEGATIVE  KETONESUR NEGATIVE  PROTEINUR 100*  NITRITE NEGATIVE  LEUKOCYTESUR LARGE*      Imaging: CT Head Wo Contrast  Result Date: 03/29/2021 CLINICAL DATA:  Altered mental status EXAM: CT HEAD WITHOUT CONTRAST TECHNIQUE: Contiguous axial images were obtained from the base of the skull through the vertex without intravenous contrast. COMPARISON:  09/17/2020 FINDINGS: Brain: No evidence of acute infarction, hemorrhage, hydrocephalus, extra-axial collection or mass lesion/mass effect. Vascular: No hyperdense vessel  or unexpected calcification. Skull: Normal. Negative for fracture or focal lesion. Sinuses/Orbits: The visualized paranasal sinuses are essentially clear. The mastoid air cells are unopacified. Other: None. IMPRESSION: Normal head CT. Electronically Signed   By: Julian Hy M.D.   On: 03/29/2021 01:12   DG Chest Port 1 View  Result Date: 03/29/2021 CLINICAL DATA:  Sepsis EXAM: PORTABLE CHEST 1 VIEW COMPARISON:  03/17/2021 FINDINGS: Cardiomegaly with pulmonary vascular congestion. No frank interstitial edema. Mild bibasilar opacities, likely atelectasis. Suspected small left pleural effusion. No pneumothorax. Left IJ dual lumen dialysis catheter terminating in the upper right atrium. Left subclavian stent. IMPRESSION: Cardiomegaly with  pulmonary vascular congestion. No frank interstitial edema. Small left pleural effusion. Mild bibasilar opacities, likely atelectasis. Electronically Signed   By: Julian Hy M.D.   On: 03/29/2021 00:44     Medications:    sodium chloride 50 mL/hr at 03/29/21 0912   cefTRIAXone (ROCEPHIN)  IV Stopped (03/29/21 7673)    [START ON 03/30/2021] Chlorhexidine Gluconate Cloth  6 each Topical Q0600   heparin  5,000 Units Subcutaneous Q8H   insulin aspart  0-5 Units Subcutaneous QHS   insulin aspart  0-9 Units Subcutaneous TID WC   acetaminophen **OR** acetaminophen  Assessment/ Plan:  [name] is a 60 y.o.  female with a past medical history of diabetes, dCHF, CAD, PVD, and ESRD on dialysis. She presents to the ED with complaints of confusion and poor intake.   [facility]/MWF/Lt Permcath/75.5 kg  End Stage Renal Disease on dialysis  Will maintain outpatient schedule during admission  Scheduled for dialysis today  UF goal 3.5L as tolerated  May consider dialysis tomorrow for additional fluid removal   2. Anemia of chronic kidney disease  Lab Results  Component Value Date   HGB 11.4 (L) 03/29/2021  Hgb above goal EPO outpatient, will hold Will monitor  3. Secondary Hyperparathyroidism:   Lab Results  Component Value Date   PTH 357 (H) 10/05/2018   CALCIUM 8.6 (L) 03/29/2021   CAION 1.09 (L) 04/13/2020   PHOS 2.5 12/13/2020    Phosphorus at goal, calcium decreased   Calcium carbonate and Renvela outpatient  4.Diabetes mellitus type II with chronic kidney disease Non-insulin dependent.  Most recent hemoglobin A1c is 6.2 on 03/29/21.  Glucose elevated this morning Managed by primary team    LOS: 0   7/8/20221:50 PM

## 2021-03-29 NOTE — Progress Notes (Signed)
This patient tolerated her tx well, vss post Hd, 3 hours of dialysis completed and  a net UF of 3.5L.  Pt is asleep but easily arousable, denies complaints at this time.

## 2021-03-29 NOTE — Progress Notes (Signed)
Pharmacy Antibiotic Note  [name] is a 60 y.o. female admitted on 03/29/2021 with sepsis.  Pharmacy has been consulted for Vancomycin , Cefepime dosing.   Pt is on HD every MWF.   Plan: Vancomycin 1750 mg IV X 1 ordered for 7/8 (loading dose) Vancomycin 750 mg IV Q MWF - HD. No trough currently ordered.   Cefepime 1 gm IV Q24H ordered for 7/8 @ 0030.       Temp (24hrs), Avg:103.2 F (39.6 C), Min:103.2 F (39.6 C), Max:103.2 F (39.6 C)  No results for input(s): WBC, CREATININE, LATICACIDVEN, VANCOTROUGH, VANCOPEAK, VANCORANDOM, GENTTROUGH, GENTPEAK, GENTRANDOM, TOBRATROUGH, TOBRAPEAK, TOBRARND, AMIKACINPEAK, AMIKACINTROU, AMIKACIN in the last 168 hours.  Estimated Creatinine Clearance: 13.2 mL/min (A) (by C-G formula based on SCr of 4.69 mg/dL (H)).    Allergies  Allergen Reactions   Ace Inhibitors Swelling and Anaphylaxis   Ativan [Lorazepam] Other (See Comments)    Reaction:  Hallucinations and headaches   Compazine [Prochlorperazine Edisylate] Anaphylaxis, Nausea And Vomiting and Other (See Comments)    Other reaction(s): dystonia from this vs. Reglan, 23 Jul - patient relates that she takes promethazine frequently with no problems   Sumatriptan Succinate Other (See Comments)    Other reaction(s): delirium and hallucinations per [facility] records   Zofran [Ondansetron] Nausea And Vomiting    Per pt. she is allergic to zofran or will experience adverse reaction like hallucination    Losartan Nausea Only   Prochlorperazine Other (See Comments)    Reaction:  Unknown . Patient does not remember reaction but she does have vertigo and anxiety along with n and v at times. Could be used to treat any of these    Reglan [Metoclopramide] Other (See Comments)    Per patient her Dr. Evelina Bucy her off it    Scopolamine Other (See Comments)    Dizziness, also has vertigo already   Tape Rash    Plastic tape causes rash   Tapentadol Rash   Ultrasound Gel Itching    Patient states the  Ultrasound gel caused itching while in skin contact and resolved when wiped away.    Antimicrobials this admission:   >>    >>   Dose adjustments this admission:   Microbiology results:  BCx:   UCx:    Sputum:    MRSA PCR:   Thank you for allowing pharmacy to be a part of this patient's care.  [name] D 03/29/2021 12:30 AM

## 2021-03-29 NOTE — Sepsis Progress Note (Signed)
Following for sepsis monitoring ?

## 2021-03-29 NOTE — ED Notes (Signed)
XR at bedside

## 2021-03-29 NOTE — ED Triage Notes (Signed)
Pt presents via EMS from home for AMS x1 day. Per report, pt's family sts pt was "not acting right" and was more agitated than normal. Pt was calling out for help throughout the day.  Family reports hx of dialysis and sts the patient had dialsyis 2 days in a row; they were concerned that pt had "too much fluid taken off".   Pt noted to be febrile on arrival.  Pt has 3+ pitting edema to bilateral LE and edema to the lower abdomen. Pt has old AV fistula site on the LUE and RUE. Vascath in the L upper chest with dressing in place; dressing is clean and dry.

## 2021-03-29 NOTE — H&P (Signed)
History and Physical    [name] EHU:[id number] DOB: [birth year] DOA: 03/29/2021  PCP: Denton Lank, MD   Patient coming from: Home  I have personally briefly reviewed patient's old medical records in [place]  Chief Complaint: Altered mental status  HPI: [name] is a 60 y.o. female with medical history significant for ESRD on HD TTS, DM, chronic diastolic heart failure, CAD, PVD, who was brought in from home with a complaint of confusion, decreased oral intake and not acting herself.  Most of the history is taken from ER notes this patient is very lethargic and unable to contribute to history.  She has had chills but no fever but otherwise has had no cough or complaints of chest pain or shortness of breath, vomiting or diarrhea.  At baseline she is able to ambulate with walker and is usually alert and oriented. ED course: On arrival, temperature 103.2, BP 143/49, pulse 88 with O2 sat 93% on room air.  Blood work significant for WBC 14,000 with hemoglobin 11.4.  Lactic acid 1.7, sodium 128.  Troponin 30 with BNP 1338.  Urinalysis with pyuria EKG, personally viewed and interpreted: Sinus rhythm at 95 with no acute ST-T wave changes Imaging: Chest x-ray with cardiomegaly and pulmonary vascular congestion without frank interstitial edema, mild bibasilar opacities likely atelectasis CT head with no acute intracranial findings  Patient was started on broad-spectrum antibiotics.  Hospitalist consulted for admission for treatment of UTI.  Review of Systems: Unable to obtain due to altered mental status  Past Medical History:  Diagnosis Date   Anemia    Anginal pain (HCC)    Anxiety    Arthritis    Asthma    Broken wrist    Bronchitis    chronic diastolic CHF 7/85/8850   Chronic kidney disease    COPD (chronic obstructive pulmonary disease) ([place])    Coronary artery disease    a. cath 2013: stenting to RCA (report not available); b. cath 2014: LM nl, pLAD 40%, mLAD nl, ost LCx  40%, mid LCx nl, pRCA 30% @ site of prior stent, mRCA 50%   Depression    Diabetes mellitus ([place])    Diabetes mellitus without complication ([place])    Diabetic neuropathy ([place])    dialysis 2006   Diverticulosis    Dizziness    Dyspnea    Elevated lipids    Environmental and seasonal allergies    ESRD (end stage renal disease) on dialysis ([place])    M-W-F   Gastroparesis    GERD (gastroesophageal reflux disease)    Headache    History of anemia due to chronic kidney disease    History of hiatal hernia    HOH (hard of hearing)    Hx of pancreatitis 2015   Hypertension    Lower extremity edema    Mitral regurgitation    a. echo 10/2013: EF 62%, noWMA, mildly dilated LA, mild to mod MR/TR, GR1DD   Myocardial infarction ([place])    Orthopnea    Parathyroid abnormality ([place])    Peripheral arterial disease ([place])    Pneumonia    Renal cancer ([place])    Renal insufficiency    Pt is on dialysis on M,W + F.   Wheezing     Past Surgical History:  Procedure Laterality Date   A/V SHUNTOGRAM Left 01/20/2018   Procedure: A/V SHUNTOGRAM;  Surgeon: Algernon Huxley, MD;  Location: [place] [facility];  Service: Cardiovascular;  Laterality: Left;  ABDOMINAL HYSTERECTOMY  1992   AMPUTATION TOE Left 10/02/2017   Procedure: AMPUTATION TOE-LEFT GREAT TOE;  Surgeon: Albertine Patricia, DPM;  Location: [facility];  Service: Podiatry;  Laterality: Left;   APPENDECTOMY     APPLICATION OF WOUND VAC N/A 11/25/2019   Procedure: APPLICATION OF WOUND VAC;  Surgeon: Katha Cabal, MD;  Location: [facility];  Service: Vascular;  Laterality: N/A;   ARTERY BIOPSY Right 10/11/2018   Procedure: BIOPSY TEMPORAL ARTERY;  Surgeon: Vickie Epley, MD;  Location: [facility];  Service: General;  Laterality: Right;   CARDIAC CATHETERIZATION Left 07/26/2015   Procedure: Left Heart Cath and Coronary Angiography;  Surgeon: Dionisio David, MD;  Location: [place] [facility];  Service: Cardiovascular;  Laterality: Left;   CATARACT  EXTRACTION W/ INTRAOCULAR LENS IMPLANT Right    CATARACT EXTRACTION W/PHACO Left 03/10/2017   Procedure: CATARACT EXTRACTION PHACO AND INTRAOCULAR LENS PLACEMENT ([place]);  Surgeon: Birder Robson, MD;  Location: [facility];  Service: Ophthalmology;  Laterality: Left;  [place] 00:51.9 AP% 14.2 CDE 7.39 fluid pack lot # [id number]   CENTRAL LINE INSERTION Right 11/11/2019   Procedure: CENTRAL LINE INSERTION;  Surgeon: Katha Cabal, MD;  Location: [facility];  Service: Vascular;  Laterality: Right;   CENTRAL LINE INSERTION  11/25/2019   Procedure: CENTRAL LINE INSERTION;  Surgeon: Katha Cabal, MD;  Location: [facility];  Service: Vascular;;   CHOLECYSTECTOMY     COLONOSCOPY WITH PROPOFOL N/A 08/12/2016   Procedure: COLONOSCOPY WITH PROPOFOL;  Surgeon: Lollie Sails, MD;  Location: [facility];  Service: Endoscopy;  Laterality: N/A;   DIALYSIS FISTULA CREATION Left    upper arm   dialysis grafts     DIALYSIS/PERMA CATHETER INSERTION N/A 11/14/2019   Procedure: DIALYSIS/PERMA CATHETER INSERTION;  Surgeon: Algernon Huxley, MD;  Location: [place] [facility];  Service: Cardiovascular;  Laterality: N/A;   DIALYSIS/PERMA CATHETER INSERTION N/A 02/03/2020   Procedure: DIALYSIS/PERMA CATHETER INSERTION;  Surgeon: Katha Cabal, MD;  Location: [place] [facility];  Service: Cardiovascular;  Laterality: N/A;   DIALYSIS/PERMA CATHETER INSERTION N/A 06/19/2020   Procedure: DIALYSIS/PERMA CATHETER INSERTION;  Surgeon: Katha Cabal, MD;  Location: [place] [facility];  Service: Cardiovascular;  Laterality: N/A;   DIALYSIS/PERMA CATHETER REMOVAL N/A 05/25/2020   Procedure: DIALYSIS/PERMA CATHETER REMOVAL;  Surgeon: Katha Cabal, MD;  Location: [place] [facility];  Service: Cardiovascular;  Laterality: N/A;   ESOPHAGOGASTRODUODENOSCOPY N/A 03/08/2015   Procedure: ESOPHAGOGASTRODUODENOSCOPY (EGD);  Surgeon: Manya Silvas, MD;  Location: [facility];  Service: Endoscopy;  Laterality: N/A;    ESOPHAGOGASTRODUODENOSCOPY (EGD) WITH PROPOFOL N/A 03/18/2016   Procedure: ESOPHAGOGASTRODUODENOSCOPY (EGD) WITH PROPOFOL;  Surgeon: [name], MD;  Location: [facility];  Service: Endoscopy;  Laterality: N/A;   EYE SURGERY Right 2018   FECAL TRANSPLANT N/A 08/23/2015   Procedure: FECAL TRANSPLANT;  Surgeon: Manya Silvas, MD;  Location: [facility] ENDOSCOPY;  Service: Endoscopy;  Laterality: N/A;   HAND SURGERY Bilateral    HEMATOMA EVACUATION Left 11/25/2019   Procedure: EVACUATION HEMATOMA;  Surgeon: Katha Cabal, MD;  Location: [facility];  Service: Vascular;  Laterality: Left;   I & D EXTREMITY Left 11/25/2019   Procedure: IRRIGATION AND DEBRIDEMENT EXTREMITY;  Surgeon: Katha Cabal, MD;  Location: [facility];  Service: Vascular;  Laterality: Left;   IR FLUORO GUIDE CV LINE RIGHT  04/06/2020   IR INJECT/THERA/INC NEEDLE/CATH/PLC EPI/CERV/THOR W/IMG  08/13/2020   IR RADIOLOGIST EVAL & MGMT  07/28/2019   IR  RADIOLOGIST EVAL & MGMT  08/11/2019   LIGATION OF ARTERIOVENOUS  FISTULA Left 11/11/2019   LIGATION OF ARTERIOVENOUS  FISTULA Left 11/11/2019   Procedure: LIGATION OF ARTERIOVENOUS  FISTULA;  Surgeon: Katha Cabal, MD;  Location: [facility];  Service: Vascular;  Laterality: Left;   LIGATIONS OF HERO GRAFT Right 06/13/2020   Procedure: LIGATION / REMOVAL OF RIGHT HERO GRAFT;  Surgeon: Katha Cabal, MD;  Location: [facility];  Service: Vascular;  Laterality: Right;   PERIPHERAL VASCULAR CATHETERIZATION N/A 12/20/2015   Procedure: Thrombectomy of dialysis access versus permcath placement;  Surgeon: Algernon Huxley, MD;  Location: [place] [facility];  Service: Cardiovascular;  Laterality: N/A;   PERIPHERAL VASCULAR CATHETERIZATION N/A 12/20/2015   Procedure: A/V Shunt Intervention;  Surgeon: Algernon Huxley, MD;  Location: [place] [facility];  Service: Cardiovascular;  Laterality: N/A;   PERIPHERAL VASCULAR CATHETERIZATION N/A 12/20/2015   Procedure: A/V Shuntogram/Fistulagram;   Surgeon: Algernon Huxley, MD;  Location: [place] [facility];  Service: Cardiovascular;  Laterality: N/A;   PERIPHERAL VASCULAR CATHETERIZATION N/A 01/02/2016   Procedure: A/V Shuntogram/Fistulagram;  Surgeon: Algernon Huxley, MD;  Location: [place] [facility];  Service: Cardiovascular;  Laterality: N/A;   PERIPHERAL VASCULAR CATHETERIZATION N/A 01/02/2016   Procedure: A/V Shunt Intervention;  Surgeon: Algernon Huxley, MD;  Location: [place] [facility];  Service: Cardiovascular;  Laterality: N/A;   TEE WITHOUT CARDIOVERSION N/A 06/11/2020   Procedure: TRANSESOPHAGEAL ECHOCARDIOGRAM (TEE);  Surgeon: Teodoro Spray, MD;  Location: [facility];  Service: Cardiovascular;  Laterality: N/A;   UPPER EXTREMITY ANGIOGRAPHY Bilateral 11/27/2020   Procedure: UPPER EXTREMITY ANGIOGRAPHY;  Surgeon: Katha Cabal, MD;  Location: [place] [facility];  Service: Cardiovascular;  Laterality: Bilateral;   UPPER EXTREMITY VENOGRAPHY Right 01/18/2020   Procedure: UPPER EXTREMITY VENOGRAPHY;  Surgeon: Katha Cabal, MD;  Location: [place] [facility];  Service: Cardiovascular;  Laterality: Right;   UPPER EXTREMITY VENOGRAPHY Bilateral 11/27/2020   Procedure: UPPER EXTREMITY VENOGRAPHY;  Surgeon: Katha Cabal, MD;  Location: [place] [facility];  Service: Cardiovascular;  Laterality: Bilateral;   VASCULAR ACCESS DEVICE INSERTION Right 04/13/2020   Procedure: INSERTION OF HERO VASCULAR ACCESS DEVICE (GRAFT);  Surgeon: Katha Cabal, MD;  Location: [facility];  Service: Vascular;  Laterality: Right;     reports that she has never smoked. She has never used smokeless tobacco. She reports previous alcohol use. She reports current drug use. Drug: Marijuana.  Allergies  Allergen Reactions   Ace Inhibitors Swelling and Anaphylaxis   Ativan [Lorazepam] Other (See Comments)    Reaction:  Hallucinations and headaches   Compazine [Prochlorperazine Edisylate] Anaphylaxis, Nausea And Vomiting and Other (See Comments)     Other reaction(s): dystonia from this vs. Reglan, 23 Jul - patient relates that she takes promethazine frequently with no problems   Sumatriptan Succinate Other (See Comments)    Other reaction(s): delirium and hallucinations per [facility] records   Zofran [Ondansetron] Nausea And Vomiting    Per pt. she is allergic to zofran or will experience adverse reaction like hallucination    Losartan Nausea Only   Prochlorperazine Other (See Comments)    Reaction:  Unknown . Patient does not remember reaction but she does have vertigo and anxiety along with n and v at times. Could be used to treat any of these    Reglan [Metoclopramide] Other (See Comments)    Per patient her Dr. Evelina Bucy her off it    Scopolamine Other (  See Comments)    Dizziness, also has vertigo already   Tape Rash    Plastic tape causes rash   Tapentadol Rash   Ultrasound Gel Itching    Patient states the Ultrasound gel caused itching while in skin contact and resolved when wiped away.    Family History  Problem Relation Age of Onset   Kidney disease Mother    Diabetes Mother    Cancer Father    Kidney disease Sister       Prior to Admission medications   Medication Sig Start Date End Date Taking? Authorizing Provider  albuterol (PROVENTIL) (2.5 MG/3ML) 0.083% nebulizer solution Take 2.5 mg by nebulization every 4 (four) hours as needed for wheezing or shortness of breath.    [provider]  albuterol (VENTOLIN HFA) 108 (90 Base) MCG/ACT inhaler Inhale 2 puffs into the lungs every 4 (four) hours as needed for wheezing or shortness of breath. 11/09/19   [provider]  alum & mag hydroxide-simeth (MAALOX/MYLANTA) 200-200-20 MG/5ML suspension Take 15 mLs by mouth every 6 (six) hours as needed for indigestion or heartburn. 06/25/20   Enzo Bi, MD  amLODipine (NORVASC) 10 MG tablet Take 1 tablet (10 mg total) by mouth daily. 06/25/20   Enzo Bi, MD  ammonium lactate (LAC-HYDRIN) 12 % lotion Apply 1 application  topically 2 (two) times daily as needed for dry skin.     [provider]  Ascorbic Acid (VITA-C PO) Take 1 tablet by mouth daily.    [provider]  aspirin EC 81 MG tablet Take 81 mg by mouth daily.    [provider]  atorvastatin (LIPITOR) 20 MG tablet Take 20 mg by mouth every evening.  10/24/19   [provider]  calcium carbonate (TUMS - DOSED IN MG ELEMENTAL CALCIUM) 500 MG chewable tablet Chew 2.5 tablets (500 mg of elemental calcium total) by mouth every 6 (six) hours as needed for indigestion. 06/25/20   Enzo Bi, MD  Cholecalciferol (VITAMIN D3) 25 MCG (1000 UT) CAPS Take 1,000 Units by mouth daily.     [provider]  cholestyramine light (PREVALITE) 4 g packet Take 4 g by mouth 2 (two) times daily.    [provider]  CREON 12000 units CPEP capsule Take 36,000 Units by mouth 3 (three) times daily before meals.  10/21/19   [provider]  cyanocobalamin 1000 MCG tablet Take 1,000 mcg by mouth daily.     [provider]  diphenhydrAMINE (BENADRYL) 25 MG tablet Take 25 mg by mouth every 6 (six) hours as needed for itching.    [provider]  epoetin alfa (EPOGEN) 10000 UNIT/ML injection Inject 1 mL (10,000 Units total) into the vein every Monday, Wednesday, and Friday with hemodialysis. 06/27/20   Enzo Bi, MD  Ferrous Sulfate (IRON) 325 (65 Fe) MG TABS Take 325 mg by mouth daily.     [provider]  furosemide (LASIX) 80 MG tablet Take 1 tablet by mouth daily. 08/13/20   [provider]  gabapentin (NEURONTIN) 100 MG capsule Take 100 mg by mouth every evening.     [provider]  hydrALAZINE (APRESOLINE) 50 MG tablet Take 1 tablet (50 mg total) by mouth every 8 (eight) hours. 06/25/20   Enzo Bi, MD  HYDROcodone-acetaminophen (NORCO/VICODIN) 5-325 MG tablet Take 1 tablet by mouth 3 (three) times daily as needed for moderate pain or severe pain. 12/17/20   Lorella Nimrod, MD   hydrOXYzine (ATARAX/VISTARIL) 25 MG tablet Take  25 mg by mouth daily.     [provider]  meclizine (ANTIVERT) 25 MG tablet Take 25 mg by mouth 4 (four) times daily as needed for dizziness.    [provider]  megestrol (MEGACE) 400 MG/10ML suspension Take 10 mLs (400 mg total) by mouth daily. 09/21/20   Shelly Coss, MD  multivitamin (RENA-VIT) TABS tablet Take 1 tablet by mouth daily.     [provider]  mupirocin ointment (BACTROBAN) 2 % Apply 1 application topically daily as needed (leg rash).  11/09/19   [provider]  nitroGLYCERIN (NITROSTAT) 0.4 MG SL tablet Place 0.4 mg under the tongue every 5 (five) minutes as needed for chest pain.     [provider]  Nutritional Supplements (FEEDING SUPPLEMENT, NEPRO CARB STEADY,) LIQD Take 237 mLs by mouth 2 (two) times daily between meals. 12/17/20   Lorella Nimrod, MD  Omega-3 300 MG CAPS Take 300 mg by mouth 2 (two) times daily. Every other day opposite omega XL    [provider]  ondansetron (ZOFRAN) 4 MG tablet Take 1 tablet (4 mg total) by mouth every 8 (eight) hours as needed for nausea or vomiting. 09/21/20 09/21/21  Shelly Coss, MD  pantoprazole (PROTONIX) 40 MG tablet Take 40 mg by mouth daily. 08/28/19   [provider]  polyethylene glycol (MIRALAX / GLYCOLAX) 17 g packet Take 17 g by mouth daily. 06/25/20   Enzo Bi, MD  Probiotic Product (PROBIOTIC PO) Take 1 capsule by mouth daily.    [provider]  sevelamer carbonate (RENVELA) 800 MG tablet Take 800 mg by mouth 4 (four) times daily. (at meals and with a snack)    [provider]    Physical Exam: Vitals:   03/29/21 0037 03/29/21 0040 03/29/21 0150 03/29/21 0200  BP:  (!) 163/76 (!) 139/53 (!) 143/49  Pulse:  97 91 88  Resp:  18 14 16   Temp:      TempSrc:      SpO2:  93% 95% 93%  Weight: 79.3 kg     Height: 5\' 5"  (1.651 m)        Vitals:   03/29/21 0037 03/29/21 0040 03/29/21 0150  03/29/21 0200  BP:  (!) 163/76 (!) 139/53 (!) 143/49  Pulse:  97 91 88  Resp:  18 14 16   Temp:      TempSrc:      SpO2:  93% 95% 93%  Weight: 79.3 kg     Height: 5\' 5"  (1.651 m)         Constitutional: Lethargic and drowsy, arousable with gentle shaking but will readily fall back asleep. Not in any apparent distress HEENT:      Head: Normocephalic and atraumatic.         Eyes: PERLA, EOMI, Conjunctivae are normal. Sclera is non-icteric.       Mouth/Throat: Mucous membranes are moist.       Neck: Supple with no signs of meningismus. Cardiovascular: Regular rate and rhythm. No murmurs, gallops, or rubs. 2+ symmetrical distal pulses are present . No JVD. No LE edema Respiratory: Respiratory effort normal .Lungs sounds diminished bilaterally. No wheezes, crackles, or rhonchi.  Gastrointestinal: Soft, non tender, and non distended with positive bowel sounds.  Genitourinary: No CVA tenderness. Musculoskeletal: Nontender with normal range of motion in all extremities. No cyanosis, or erythema of extremities. Neurologic:  Face is symmetric. Moving all extremities. No gross focal neurologic deficits . Skin: Skin is warm, dry.  No rash  or ulcers Psychiatric: Unable to assess due to lethargy and somnolence   Labs on Admission: I have personally reviewed following labs and imaging studies  CBC: Recent Labs  Lab 03/29/21 0025  WBC 14.0*  NEUTROABS 11.8*  HGB 11.4*  HCT 35.6*  MCV 84.2  PLT 726*   Basic Metabolic Panel: Recent Labs  Lab 03/29/21 0025  NA 128*  K 4.0  CL 92*  CO2 28  GLUCOSE 288*  BUN 33*  CREATININE 4.21*  CALCIUM 8.6*   GFR: Estimated Creatinine Clearance: 14.6 mL/min (A) (by C-G formula based on SCr of 4.21 mg/dL (H)). Liver Function Tests: Recent Labs  Lab 03/29/21 0025  AST 19  ALT 12  ALKPHOS 104  BILITOT 0.8  PROT 6.7  ALBUMIN 2.7*   No results for input(s): LIPASE, AMYLASE in the last 168 hours. No results for input(s): AMMONIA in the  last 168 hours. Coagulation Profile: Recent Labs  Lab 03/29/21 0021  INR 1.3*   Cardiac Enzymes: No results for input(s): CKTOTAL, CKMB, CKMBINDEX, TROPONINI in the last 168 hours. BNP (last 3 results) No results for input(s): PROBNP in the last 8760 hours. HbA1C: No results for input(s): HGBA1C in the last 72 hours. CBG: No results for input(s): GLUCAP in the last 168 hours. Lipid Profile: No results for input(s): CHOL, HDL, LDLCALC, TRIG, CHOLHDL, LDLDIRECT in the last 72 hours. Thyroid Function Tests: No results for input(s): TSH, T4TOTAL, FREET4, T3FREE, THYROIDAB in the last 72 hours. Anemia Panel: No results for input(s): VITAMINB12, FOLATE, FERRITIN, TIBC, IRON, RETICCTPCT in the last 72 hours. Urine analysis:    Component Value Date/Time   COLORURINE YELLOW (A) 03/29/2021 0020   APPEARANCEUR TURBID (A) 03/29/2021 0020   APPEARANCEUR Clear 09/15/2014 0947   LABSPEC 1.010 03/29/2021 0020   LABSPEC 1.008 09/15/2014 0947   PHURINE 5.0 03/29/2021 0020   GLUCOSEU >=500 (A) 03/29/2021 0020   GLUCOSEU 150 mg/dL 09/15/2014 0947   HGBUR MODERATE (A) 03/29/2021 0020   BILIRUBINUR NEGATIVE 03/29/2021 0020   BILIRUBINUR Negative 09/15/2014 0947   KETONESUR NEGATIVE 03/29/2021 0020   PROTEINUR 100 (A) 03/29/2021 0020   UROBILINOGEN 0.2 06/30/2010 2130   NITRITE NEGATIVE 03/29/2021 0020   LEUKOCYTESUR LARGE (A) 03/29/2021 0020   LEUKOCYTESUR Negative 09/15/2014 0947    Radiological Exams on Admission: CT Head Wo Contrast  Result Date: 03/29/2021 CLINICAL DATA:  Altered mental status EXAM: CT HEAD WITHOUT CONTRAST TECHNIQUE: Contiguous axial images were obtained from the base of the skull through the vertex without intravenous contrast. COMPARISON:  09/17/2020 FINDINGS: Brain: No evidence of acute infarction, hemorrhage, hydrocephalus, extra-axial collection or mass lesion/mass effect. Vascular: No hyperdense vessel or unexpected calcification. Skull: Normal. Negative for  fracture or focal lesion. Sinuses/Orbits: The visualized paranasal sinuses are essentially clear. The mastoid air cells are unopacified. Other: None. IMPRESSION: Normal head CT. Electronically Signed   By: Julian Hy M.D.   On: 03/29/2021 01:12   DG Chest Port 1 View  Result Date: 03/29/2021 CLINICAL DATA:  Sepsis EXAM: PORTABLE CHEST 1 VIEW COMPARISON:  03/17/2021 FINDINGS: Cardiomegaly with pulmonary vascular congestion. No frank interstitial edema. Mild bibasilar opacities, likely atelectasis. Suspected small left pleural effusion. No pneumothorax. Left IJ dual lumen dialysis catheter terminating in the upper right atrium. Left subclavian stent. IMPRESSION: Cardiomegaly with pulmonary vascular congestion. No frank interstitial edema. Small left pleural effusion. Mild bibasilar opacities, likely atelectasis. Electronically Signed   By: Julian Hy M.D.   On: 03/29/2021 00:44     Assessment/Plan 60 year old female  with history of ESRD on HD TTS, DM, chronic diastolic heart failure, CAD, PVD, presenting with confusion, decreased oral intake and not acting herself.      UTI with possible sepsis Acute metabolic encephalopathy -Patient with fever, leukocytosis of 14,000 and pyuria on urinalysis, presenting with altered mental status - IV hydration for sepsis with monitoring for fluid overload in view of hemodialysis status, mild congestion on CXR and elevated BNP - IV Rocephin - Follow cultures -Neurologic checks, aspiration precautions    End-stage renal disease on hemodialysis [facility]) - Nephrology consult for continuation of dialysis  Chronic diastolic heart failure - Monitor for fluid overload in view of hydration the treatment of sepsis -Chest x-ray with cardiomegaly and pulmonary vascular congestion without frank interstitial edema and BNP over 1300    Diabetes mellitus, type II (HCC) - Sliding scale insulin coverage    HTN (hypertension) - BP somewhat soft so we will hold  antihypertensives until more stable    Coronary artery disease involving native coronary artery of native heart without angina pectoris - Had no complaints of chest pain, troponin mildly elevated at 30 and EKG nonacute    DVT prophylaxis: Heparin Code Status: full Family Communication:  none  Disposition Plan: Back to previous home environment Consults called: Nephrology Status:At the time of admission, it appears that the appropriate admission status for this patient is INPATIENT. This is judged to be reasonable and necessary in order to provide the required intensity of service to ensure the patient's safety given the presenting symptoms, physical exam findings, and initial radiographic and laboratory data in the context of their  Comorbid conditions.   Patient requires inpatient status due to high intensity of service, high risk for further deterioration and high frequency of surveillance required.   I certify that at the point of admission it is my clinical judgment that the patient will require inpatient hospital care spanning beyond [place] MD [facility]     03/29/2021, 2:46 AM

## 2021-03-29 NOTE — Progress Notes (Signed)
Patient ID: MACARIA BIAS, female   DOB: [birth year], 60 y.o.   MRN: [id number] This is a no charge note as patient was admitted this AM.  Patient seen and examined.  H&P reviewed   CLETUS MEHLHOFF is a 60 y.o. female with a past medical history of diabetes, dCHF, CAD, PVD, and ESRD on dialysis. She presents to the ED with complaints of confusion and poor intake.ED course: On arrival, temperature 103.2, BP 143/49, pulse 88 with O2 sat 93% on room air.  Blood work significant for WBC 14,000 with hemoglobin 11.4.  Lactic acid 1.7, sodium 128.  Troponin 30 with BNP 1338.  Urinalysis with pyuria EKG, personally viewed and interpreted: Sinus rhythm at 95 with no acute ST-T wave changes   CT head with no acute intracranial findings  A/P UTI/actue metabolic encephalopathy Monitor labs Ivf for hydration, monitor closely Iv rocephin F/u cultures.  Nephrology consulted .

## 2021-03-29 NOTE — Progress Notes (Signed)
Orders to dialyze this patient for 3 hours today for a net UF of 3.5L. Patient arrived with a low grade fever of 100 oral after having taken tylenol on the care unit. Patient c/o pain in rihght shoulder and had received tylenol for the same pre arrival. Will monitor for any acute changes.

## 2021-03-29 NOTE — ED Notes (Signed)
Additional tubes sent from blood draw; added on labs at this time.

## 2021-03-30 ENCOUNTER — Inpatient Hospital Stay: Payer: Medicare Other

## 2021-03-30 LAB — BASIC METABOLIC PANEL
Anion gap: 7 (ref 5–15)
BUN: 27 mg/dL — ABNORMAL HIGH (ref 8–23)
CO2: 27 mmol/L (ref 22–32)
Calcium: 7.5 mg/dL — ABNORMAL LOW (ref 8.9–10.3)
Chloride: 96 mmol/L — ABNORMAL LOW (ref 98–111)
Creatinine, Ser: 3.6 mg/dL — ABNORMAL HIGH (ref 0.44–1.00)
GFR, Estimated: 14 mL/min — ABNORMAL LOW (ref >60)
Glucose, Bld: 67 mg/dL — ABNORMAL LOW (ref 70–99)
Potassium: 3.7 mmol/L (ref 3.5–5.1)
Sodium: 130 mmol/L — ABNORMAL LOW (ref 135–145)

## 2021-03-30 LAB — GLUCOSE, CAPILLARY
Glucose-Capillary: 109 mg/dL — ABNORMAL HIGH (ref 70–99)
Glucose-Capillary: 121 mg/dL — ABNORMAL HIGH (ref 70–99)
Glucose-Capillary: 126 mg/dL — ABNORMAL HIGH (ref 70–99)

## 2021-03-30 MED ORDER — LIDOCAINE 5 % EX PTCH
1.0000 | MEDICATED_PATCH | CUTANEOUS | Status: DC
  Administered 2021-03-30 – 2021-04-02 (×4): 1 via TRANSDERMAL
  Filled 2021-03-30 (×4): qty 1

## 2021-03-30 MED ORDER — PENTAFLUOROPROP-TETRAFLUOROETH EX AERO
1.0000 "application " | INHALATION_SPRAY | CUTANEOUS | Status: DC | PRN
  Filled 2021-03-30: qty 30

## 2021-03-30 MED ORDER — SODIUM CHLORIDE 0.9 % IV SOLN: 100.0000 mL | INTRAVENOUS | Status: DC | PRN

## 2021-03-30 MED ORDER — LIDOCAINE HCL (PF) 1 % IJ SOLN
5.0000 mL | INTRAMUSCULAR | Status: DC | PRN
  Filled 2021-03-30: qty 5

## 2021-03-30 MED ORDER — SEVELAMER CARBONATE 800 MG PO TABS
3200.0000 mg | ORAL_TABLET | Freq: Three times a day (TID) | ORAL | Status: DC
  Administered 2021-03-30: 3200 mg via ORAL
  Filled 2021-03-30 (×2): qty 4

## 2021-03-30 MED ORDER — ALTEPLASE 2 MG IJ SOLR
2.0000 mg | Freq: Once | INTRAMUSCULAR | Status: DC | PRN
  Filled 2021-03-30: qty 2

## 2021-03-30 MED ORDER — LIDOCAINE-PRILOCAINE 2.5-2.5 % EX CREA
1.0000 "application " | TOPICAL_CREAM | CUTANEOUS | Status: DC | PRN
  Filled 2021-03-30: qty 5

## 2021-03-30 MED ORDER — PANCRELIPASE (LIP-PROT-AMYL) 12000-38000 UNITS PO CPEP
12000.0000 [IU] | ORAL_CAPSULE | Freq: Three times a day (TID) | ORAL | Status: DC
  Administered 2021-03-30 – 2021-04-02 (×8): 12000 [IU] via ORAL
  Filled 2021-03-30 (×11): qty 1

## 2021-03-30 NOTE — Evaluation (Signed)
Occupational Therapy Evaluation Patient Details Name: [name] MRN: [id number] DOB: 1961-09-22 Today's Date: 03/30/2021    History of Present Illness [name] is a 60 y.o. female with medical history significant for ESRD on HD TTS, DM, chronic diastolic heart failure, CAD, PVD, who was brought in from home with a complaint of confusion, decreased oral intake and not acting herself. On arrival, temperature 103.2, BP 143/49, pulse 88 with O2 sat 93% on room air.  Blood work significant for WBC 14,000 with hemoglobin 11.4.  Lactic acid 1.7, sodium 128.  Troponin 30 with BNP 1338.  Urinalysis with pyuria.   Clinical Impression   [name] presents today with generalized weakness, limited endurance, limited ROM, significant edema in R UE and b/l LE, and 10/10 pain. Pt currently requires Max A for bed mobility, and screams with any movement of R UE or b/l LE. She states that "it feels like someone is sticking needles into my legs." PTA, pt lives alone with PRN care/supervision. Pt voices her strong desire to be able to return to her apt, but, at this point, must recommend STR post D/C, as pt is unable to complete basic ADL care without at least Mod A.      Follow Up Recommendations  SNF    Equipment Recommendations       Recommendations for Other Services       Precautions / Restrictions Precautions Precautions: Fall Restrictions Weight Bearing Restrictions: No      Mobility Bed Mobility Overal bed mobility: Needs Assistance Bed Mobility: Rolling Rolling: Mod assist         General bed mobility comments: Mod A, 2/2 pain; pt declines to come into EOB sitting. Pt crying/yelling in pain with movement.    Transfers                 General transfer comment: OOB transfer not attempted    Balance Overall balance assessment: Needs assistance   Sitting balance-Leahy Scale: Poor Sitting balance - Comments: cannot come into upright sitting, 2/2 pain     Standing  balance-Leahy Scale: Zero                             ADL either performed or assessed with clinical judgement   ADL Overall ADL's : Needs assistance/impaired Eating/Feeding: Set up;Minimal assistance Eating/Feeding Details (indicate cue type and reason): requires Min A for opening containers. 2/2 R UE pain                                 Functional mobility during ADLs: Maximal assistance       Vision Patient Visual Report: No change from baseline       Perception     Praxis      Pertinent Vitals/Pain Pain Assessment: 0-10 Pain Score: 10-Worst pain ever Pain Location: R arm, B LE Pain Descriptors / Indicators: Crying;Moaning;Stabbing Pain Intervention(s): Limited activity within patient's tolerance;Monitored during session;Repositioned;RN gave pain meds during session;Patient requesting pain meds-RN notified;Utilized relaxation techniques     Hand Dominance Left   Extremity/Trunk Assessment Upper Extremity Assessment Upper Extremity Assessment: RUE deficits/detail;Generalized weakness RUE Deficits / Details: edema, pain, limited ROM, sensitive to touch RUE: Unable to fully assess due to pain RUE Coordination: decreased fine motor;decreased gross motor   Lower Extremity Assessment Lower Extremity Assessment: Generalized weakness       Communication Communication  Communication: No difficulties   Cognition Arousal/Alertness: Awake/alert Behavior During Therapy: WFL for tasks assessed/performed Overall Cognitive Status: Within Functional Limits for tasks assessed                                    General Comments  significant edema in R UE, b/l LE    Exercises Total Joint Exercises Ankle Circles/Pumps: AROM;5 reps;Supine;Both Other Exercises Other Exercises: Educ re: role of OT, POC, relaxation techniques for pain mgmt   Shoulder Instructions      Home Living Family/patient expects to be discharged to:: Private  residence Living Arrangements: Alone Available Help at Discharge: Family;Available PRN/intermittently Type of Home: Apartment Home Access: Level entry     Home Layout: One level     Bathroom Shower/Tub: Teacher, early years/pre: Standard Bathroom Accessibility: No   Home Equipment: Walker - 4 wheels;Cane - single point;Cane - quad;Wheelchair - manual;Toilet riser;Walker - 2 wheels   Additional Comments: Pt's son visits daily. Neighbor checks in frequently. Uses van service to get to HD.      Prior Functioning/Environment Level of Independence: Needs assistance  Gait / Transfers Assistance Needed: "depending on the day determines what I use to walk" ADL's / Homemaking Assistance Needed: Pt reports being IND with ADL, uses sponge baths for bathing. Does own cooking, cleaning, laundry. Receives assistance for driving, shopping.   Comments: reports she typically uses rollator inside and [facility] for community distances. Reports she sleeps in her lift chair. No falls.        OT Problem List: Decreased strength;Decreased range of motion;Decreased activity tolerance;Impaired balance (sitting and/or standing);Increased edema;Pain;Impaired sensation      OT Treatment/Interventions: Self-care/ADL training;Therapeutic exercise;Patient/family education;Balance training;Energy conservation;DME and/or AE instruction;Therapeutic activities    OT Goals(Current goals can be found in the care plan section) Acute Rehab OT Goals Patient Stated Goal: to be able to go home rather than to a nursing home OT Goal Formulation: With patient Time For Goal Achievement: 04/13/21 Potential to Achieve Goals: Good ADL Goals Pt Will Perform Grooming: standing;with supervision Pt Will Transfer to Toilet: with supervision;ambulating Pt Will Perform Toileting - Clothing Manipulation and hygiene: with supervision;sit to/from stand Pt Will Perform Tub/Shower Transfer: with supervision;ambulating  OT  Frequency: Min 1X/week   Barriers to D/C: Decreased caregiver support          Co-evaluation              AM-PAC OT "6 Clicks" Daily Activity     Outcome Measure Help from another person eating meals?: A Little Help from another person taking care of personal grooming?: A Little Help from another person toileting, which includes using toliet, bedpan, or urinal?: A Lot Help from another person bathing (including washing, rinsing, drying)?: A Lot Help from another person to put on and taking off regular upper body clothing?: A Lot Help from another person to put on and taking off regular lower body clothing?: A Lot 6 Click Score: 14   End of Session Nurse Communication: Patient requests pain meds  Activity Tolerance: Patient limited by pain Patient left: in bed;with call bell/phone within reach;with nursing/sitter in room;with bed alarm set  OT Visit Diagnosis: Unsteadiness on feet (R26.81);Pain;Muscle weakness (generalized) (M62.81)                Time: 4193-7902 OT Time Calculation (min): 33 min Charges:  OT General Charges $OT Visit: 1 Visit OT Evaluation $  OT Eval Moderate Complexity: 1 Mod OT Treatments $Self Care/Home Management : 23-37 mins Josiah Lobo, PhD, MS, OTR/L 03/30/21, 4:44 PM

## 2021-03-30 NOTE — Progress Notes (Signed)
[facility]  ROUNDING NOTE   Subjective:   Hemodialysis treatment yesterday. UF 3545mL.   Patient complains of pain. She is tearful.    Objective:  Vital signs in last 24 hours:  Temp:  [98.1 F (36.7 C)-99.7 F (37.6 C)] 99.4 F (37.4 C) (07/09 1001) Pulse Rate:  [71-79] 75 (07/09 1001) Resp:  [16-20] 20 (07/09 1001) BP: (77-147)/(44-56) 145/55 (07/09 1001) SpO2:  [96 %-100 %] 97 % (07/09 1001)  Weight change:  Filed Weights   03/29/21 0037  Weight: 79.3 kg    Intake/Output: I/O last 3 completed shifts: In: 737.5 [I.V.:537.5; IV Piggyback:200] Out: 1610 [Urine:15; Other:3500]   Intake/Output this shift:  No intake/output data recorded.  Physical Exam: General: NAD, resting in bed  Head: Normocephalic, atraumatic. Moist oral mucosal membranes  Eyes: Anicteric  Lungs:  clear, normal effort  Heart: Regular rate and rhythm  Abdomen:  Soft, nontender  Extremities:  2+ peripheral edema.  Neurologic: Nonfocal, moving all four extremities  Skin: No lesions  Access: Lt IJ Permcath    Basic Metabolic Panel: Recent Labs  Lab 03/29/21 0025 03/30/21 0906  NA 128* 130*  K 4.0 3.7  CL 92* 96*  CO2 28 27  GLUCOSE 288* 67*  BUN 33* 27*  CREATININE 4.21* 3.60*  CALCIUM 8.6* 7.5*     Liver Function Tests: Recent Labs  Lab 03/29/21 0025  AST 19  ALT 12  ALKPHOS 104  BILITOT 0.8  PROT 6.7  ALBUMIN 2.7*    No results for input(s): LIPASE, AMYLASE in the last 168 hours. No results for input(s): AMMONIA in the last 168 hours.  CBC: Recent Labs  Lab 03/29/21 0025  WBC 14.0*  NEUTROABS 11.8*  HGB 11.4*  HCT 35.6*  MCV 84.2  PLT 138*     Cardiac Enzymes: No results for input(s): CKTOTAL, CKMB, CKMBINDEX, TROPONINI in the last 168 hours.  BNP: Invalid input(s): POCBNP  CBG: Recent Labs  Lab 03/29/21 0847 03/29/21 1236 03/29/21 1952 03/30/21 1003 03/30/21 1110  GLUCAP 171* 108* 114* 109* 121*     Microbiology: Results  for orders placed or performed during the hospital encounter of 03/29/21  Urine culture     Status: Abnormal (Preliminary result)   Collection Time: 03/29/21 12:20 AM   Specimen: In/Out Cath Urine  Result Value Ref Range Status   Specimen Description   Final    IN/OUT CATH URINE Performed at [facility], [address], [place], [place] [address]    Special Requests   Final    NONE Performed at [facility], [address], [place], [place] [address]    Culture (A)  Final    >=100,000 COLONIES/mL ESCHERICHIA COLI SUSCEPTIBILITIES TO FOLLOW Performed at [facility], [place] [address] [place], [place] [address]    Report Status PENDING  Incomplete  Resp Panel by RT-PCR (Flu A&B, Covid) Nasopharyngeal Swab     Status: None   Collection Time: 03/29/21 12:25 AM   Specimen: Nasopharyngeal Swab; Nasopharyngeal(NP) swabs in vial transport medium  Result Value Ref Range Status   SARS Coronavirus 2 by RT PCR NEGATIVE NEGATIVE Final    Comment: (NOTE) SARS-CoV-2 target nucleic acids are NOT DETECTED.  The SARS-CoV-2 RNA is generally detectable in upper respiratory specimens during the acute phase of infection. The lowest concentration of SARS-CoV-2 viral copies this assay can detect is 138 copies/mL. A negative result does not preclude SARS-Cov-2 infection and should not be used as the sole basis for  treatment or other patient management decisions. A negative result may occur with  improper specimen collection/handling, submission of specimen other than nasopharyngeal swab, presence of viral mutation(s) within the areas targeted by this assay, and inadequate number of viral copies(<138 copies/mL). A negative result must be combined with clinical observations, patient history, and epidemiological information. The expected result is Negative.  Fact Sheet for Patients:  EntrepreneurPulse.com.au  Fact Sheet for Healthcare Providers:   IncredibleEmployment.be  This test is no t yet approved or cleared by the [place] FDA and  has been authorized for detection and/or diagnosis of SARS-CoV-2 by FDA under an Emergency Use Authorization (EUA). This EUA will remain  in effect (meaning this test can be used) for the duration of the COVID-19 declaration under Section 564(b)(1) of the Act, 21 U.S.C.section 360bbb-3(b)(1), unless the authorization is terminated  or revoked sooner.       Influenza A by PCR NEGATIVE NEGATIVE Final   Influenza B by PCR NEGATIVE NEGATIVE Final    Comment: (NOTE) The Xpert Xpress SARS-CoV-2/FLU/RSV plus assay is intended as an aid in the diagnosis of influenza from Nasopharyngeal swab specimens and should not be used as a sole basis for treatment. Nasal washings and aspirates are unacceptable for Xpert Xpress SARS-CoV-2/FLU/RSV testing.  Fact Sheet for Patients: EntrepreneurPulse.com.au  Fact Sheet for Healthcare Providers: IncredibleEmployment.be  This test is not yet approved or cleared by the [place] FDA and has been authorized for detection and/or diagnosis of SARS-CoV-2 by FDA under an Emergency Use Authorization (EUA). This EUA will remain in effect (meaning this test can be used) for the duration of the COVID-19 declaration under Section 564(b)(1) of the Act, 21 U.S.C. section 360bbb-3(b)(1), unless the authorization is terminated or revoked.  Performed at [facility], [address] [place], [place] [address]   Blood Culture (routine x 2)     Status: None (Preliminary result)   Collection Time: 03/29/21 12:28 AM   Specimen: BLOOD  Result Value Ref Range Status   Specimen Description BLOOD LEFT FOREARM  Final   Special Requests   Final    BOTTLES DRAWN AEROBIC AND ANAEROBIC Blood Culture adequate volume   Culture   Final    NO GROWTH 1 DAY Performed at [facility], [address],  [place], [place] [address]    Report Status PENDING  Incomplete  Blood Culture (routine x 2)     Status: None (Preliminary result)   Collection Time: 03/29/21 12:28 AM   Specimen: BLOOD  Result Value Ref Range Status   Specimen Description BLOOD LEFT THUMB  Final   Special Requests   Final    BOTTLES DRAWN AEROBIC AND ANAEROBIC Blood Culture results may not be optimal due to an inadequate volume of blood received in culture bottles   Culture   Final    NO GROWTH 1 DAY Performed at [facility], [address], [place], [place] [address]    Report Status PENDING  Incomplete  MRSA Next Gen by PCR, Nasal     Status: None   Collection Time: 03/29/21  7:05 PM   Specimen: Nasal Mucosa; Nasal Swab  Result Value Ref Range Status   MRSA by PCR Next Gen NOT DETECTED NOT DETECTED Final    Comment: (NOTE) The GeneXpert MRSA Assay (FDA approved for NASAL specimens only), is one component of a comprehensive MRSA colonization surveillance program. It is not intended to diagnose MRSA infection nor to guide or monitor treatment for MRSA infections. Test  performance is not FDA approved in patients less than 73 years old. Performed at [facility], [address] [place], [place] [address]     Coagulation Studies: Recent Labs    03/29/21 0021  LABPROT 16.1*  INR 1.3*     Urinalysis: Recent Labs    03/29/21 0020  COLORURINE YELLOW*  LABSPEC 1.010  PHURINE 5.0  GLUCOSEU >=500*  HGBUR MODERATE*  BILIRUBINUR NEGATIVE  KETONESUR NEGATIVE  PROTEINUR 100*  NITRITE NEGATIVE  LEUKOCYTESUR LARGE*       Imaging: CT Head Wo Contrast  Result Date: 03/29/2021 CLINICAL DATA:  Altered mental status EXAM: CT HEAD WITHOUT CONTRAST TECHNIQUE: Contiguous axial images were obtained from the base of the skull through the vertex without intravenous contrast. COMPARISON:  09/17/2020 FINDINGS: Brain: No evidence of acute infarction, hemorrhage, hydrocephalus, extra-axial collection or  mass lesion/mass effect. Vascular: No hyperdense vessel or unexpected calcification. Skull: Normal. Negative for fracture or focal lesion. Sinuses/Orbits: The visualized paranasal sinuses are essentially clear. The mastoid air cells are unopacified. Other: None. IMPRESSION: Normal head CT. Electronically Signed   By: Julian Hy M.D.   On: 03/29/2021 01:12   DG Chest Port 1 View  Result Date: 03/29/2021 CLINICAL DATA:  Sepsis EXAM: PORTABLE CHEST 1 VIEW COMPARISON:  03/17/2021 FINDINGS: Cardiomegaly with pulmonary vascular congestion. No frank interstitial edema. Mild bibasilar opacities, likely atelectasis. Suspected small left pleural effusion. No pneumothorax. Left IJ dual lumen dialysis catheter terminating in the upper right atrium. Left subclavian stent. IMPRESSION: Cardiomegaly with pulmonary vascular congestion. No frank interstitial edema. Small left pleural effusion. Mild bibasilar opacities, likely atelectasis. Electronically Signed   By: Julian Hy M.D.   On: 03/29/2021 00:44     Medications:    sodium chloride     sodium chloride     cefTRIAXone (ROCEPHIN)  IV 1 g (03/30/21 1015)    Chlorhexidine Gluconate Cloth  6 each Topical Q0600   heparin  5,000 Units Subcutaneous Q8H   insulin aspart  0-5 Units Subcutaneous QHS   insulin aspart  0-9 Units Subcutaneous TID WC   lidocaine  1 patch Transdermal Q24H   lipase/protease/amylase  12,000 Units Oral TID AC   multivitamin  1 tablet Oral QHS   sodium chloride, sodium chloride, acetaminophen **OR** acetaminophen, alteplase, HYDROcodone-acetaminophen, hydrOXYzine, lidocaine (PF), lidocaine-prilocaine, pentafluoroprop-tetrafluoroeth  Assessment/ Plan:  [name] is a 60 y.o.  female with a past medical history of diabetes, dCHF, CAD, PVD, and ESRD on dialysis. She presents to the ED with complaints of confusion and poor intake.   [facility] MWF Lt Permcath 75.5 kg  End Stage Renal Disease on dialysis:  hemodialysis yesterday.   2. Anemia of chronic kidney disease  Lab Results  Component Value Date   HGB 11.4 (L) 03/29/2021   EPO outpatient   3. Secondary Hyperparathyroidism:   Lab Results  Component Value Date   PTH 357 (H) 10/05/2018   CALCIUM 7.5 (L) 03/30/2021   CAION 1.09 (L) 04/13/2020   PHOS 2.5 12/13/2020    Phosphorus at goal, calcium decreased   Holding calcium carbonate and sevelamer  4.Diabetes mellitus type II with chronic kidney disease Non-insulin dependent.  Most recent hemoglobin A1c is 6.2 on 03/29/21.     LOS: 1 [name] 7/9/20223:35 PM

## 2021-03-30 NOTE — Progress Notes (Addendum)
Pt continues to have significant pain today, rating it a "7" all day despite pain medication. Every time we have to touch her or turn her, which is frequent, she screams out in pain. Patient has been medicated twice with her Norco Q6 hours and her lidocaine patch was applied for the first time this morning. Dr. Kurtis Bushman notified.

## 2021-03-30 NOTE — Progress Notes (Signed)
PT EVALUATION:    03/30/21 1600  PT Visit Information  Last PT Received On 03/30/21  Assistance Needed +1  History of Present Illness [name] is a 60 y.o. female with medical history significant for ESRD on HD TTS, DM, chronic diastolic heart failure, CAD, PVD, who was brought in from home with a complaint of confusion, decreased oral intake and not acting herself. On arrival, temperature 103.2, BP 143/49, pulse 88 with O2 sat 93% on room air.  Blood work significant for WBC 14,000 with hemoglobin 11.4.  Lactic acid 1.7, sodium 128.  Troponin 30 with BNP 1338.  Urinalysis with pyuria.  Precautions  Precautions Fall  Restrictions  Weight Bearing Restrictions No  Home Living  Family/patient expects to be discharged to: Private residence (Simultaneous filing. User may not have seen previous data.)  Living Arrangements Alone (Simultaneous filing. User may not have seen previous data.)  Available Help at Discharge Family;Available PRN/intermittently (Simultaneous filing. User may not have seen previous data.)  Type of Home Apartment (Simultaneous filing. User may not have seen previous data.)  Home Access Level entry (Simultaneous filing. User may not have seen previous data.)  Home Layout One level (Simultaneous filing. User may not have seen previous data.)  Bathroom Shower/Tub Tub/shower unit (Simultaneous filing. User may not have seen previous data.)  Bathroom Toilet Standard (Simultaneous filing. User may not have seen previous data.)  Bathroom Accessibility No  Reserve - 4 wheels;Cane - single point;Cane - quad;Wheelchair - manual;Toilet riser;Walker - 2 wheels (Simultaneous filing. User may not have seen previous data.)  Additional Comments Pt's son visits daily. Neighbor checks in frequently. Uses van service to get to HD.  Prior Function  Level of Independence Needs assistance (Simultaneous filing. User may not have seen previous data.)  Gait / Transfers  Assistance Needed "depending on the day determines what I use to walk"  Comments reports she typically uses rollator inside and [facility] for community distances. Reports she sleeps in her lift chair. No falls. (Simultaneous filing. User may not have seen previous data.)  Communication  Communication No difficulties (Simultaneous filing. User may not have seen previous data.)  Pain Assessment  Pain Assessment Faces  Pain Score 10  Pain Location R arm, B LE  Pain Descriptors / Indicators Pins and needles;Jabbing;Moaning  Pain Intervention(s) Limited activity within patient's tolerance;Patient requesting pain meds-RN notified  Cognition  Arousal/Alertness Awake/alert  Behavior During Therapy WFL for tasks assessed/performed  Overall Cognitive Status Within Functional Limits for tasks assessed  General Comments easily becomes tangental, emotional and crying  Upper Extremity Assessment  Upper Extremity Assessment Generalized weakness (B UE grossly 3/5)  Lower Extremity Assessment  Lower Extremity Assessment Generalized weakness (B LE grossly 2/5; decreased B LE sensation distal to knee)  Bed Mobility  Overal bed mobility Needs Assistance  Bed Mobility Rolling  Rolling Max assist  General bed mobility comments attempted rolling towards R side. Pt begins screaming during transition. Ideally would need +2 for safe mobility. Unable to tolerate further mobility efforts  Transfers  General transfer comment unable  PT - End of Session  Activity Tolerance Patient limited by pain  Patient left in bed;with bed alarm set (handoff to OT)  Nurse Communication Mobility status  PT Assessment  PT Recommendation/Assessment Patient needs continued PT services  PT Visit Diagnosis Muscle weakness (generalized) (M62.81);Difficulty in walking, not elsewhere classified (R26.2);Pain  Pain - Right/Left Right  Pain - part of body Shoulder  PT Problem List Decreased strength;Decreased activity  tolerance;Decreased  balance;Decreased mobility;Impaired sensation;Pain  PT Plan  PT Frequency (ACUTE ONLY) Min 2X/week  PT Treatment/Interventions (ACUTE ONLY) Gait training;DME instruction;Therapeutic activities;Therapeutic exercise  AM-PAC PT "6 Clicks" Mobility Outcome Measure (Version 2)  Help needed turning from your back to your side while in a flat bed without using bedrails? 2  Help needed moving from lying on your back to sitting on the side of a flat bed without using bedrails? 1  Help needed moving to and from a bed to a chair (including a wheelchair)? 1  Help needed standing up from a chair using your arms (e.g., wheelchair or bedside chair)? 1  Help needed to walk in hospital room? 1  Help needed climbing 3-5 steps with a railing?  1  6 Click Score 7  Consider Recommendation of Discharge To: CIR/SNF/LTACH  PT Recommendation  Follow Up Recommendations SNF  PT equipment Hospital bed  Individuals Consulted  Consulted and Agree with Results and Recommendations Patient  Acute Rehab PT Goals  Patient Stated Goal to have less pain  PT Goal Formulation With patient  Time For Goal Achievement 04/13/21  Potential to Achieve Goals Good  PT Time Calculation  PT Start Time (ACUTE ONLY) 1451  PT Stop Time (ACUTE ONLY) 1517  PT Time Calculation (min) (ACUTE ONLY) 26 min  PT General Charges  $$ ACUTE PT VISIT 1 Visit  PT Evaluation  $PT Eval Moderate Complexity 1 Mod  Written Expression  Dominant Hand Left    Clinical Impression: Pt is a pleasant 60 year old female who was admitted for acute metabolic encephalopathy. Pt performs bed mobility with max assist, however unable to fully complete movement. Begins screaming out in pain and is requesting pain meds. Pt reports she wants to have BM, however refusing the bed pan or OOB mobility. Reports once she has meds, she will roll to side for hygiene. Report handoff given to OT who was entering room at time of exit. Pt demonstrates deficits with  strength/awareness/pain. Currently isn't safe to being alone at home and isn't currently at baseline level. Reports new onset R shoulder pain this week. Would benefit from skilled PT to address above deficits and promote optimal return to PLOF; recommend transition to STR upon discharge from acute hospitalization.  [name], PT, DPT [phone]

## 2021-03-30 NOTE — Progress Notes (Signed)
Pt once again refusing all protein drinks, saying, "It gives me the runs and I won't take anymore!". Dr. Kurtis Bushman notified and orders received.

## 2021-03-30 NOTE — Progress Notes (Signed)
PROGRESS NOTE    [name]  WUX:[id number] DOB: Mar 25, 1961 DOA: 03/29/2021 PCP: Denton Lank, MD    Brief Narrative:  [name] is a 60 y.o. female with medical history significant for ESRD on HD TTS, DM, chronic diastolic heart failure, CAD, PVD, who was brought in from home with a complaint of confusion, decreased oral intake and not acting herself.  Most of the history is taken from ER notes this patient is very lethargic and unable to contribute to history.  She has had chills but no fever but otherwise has had no cough or complaints of chest pain or shortness of breath, vomiting or diarrhea.  At baseline she is able to ambulate with walker and is usually alert and oriented. Chest x-ray with cardiomegaly and pulmonary vascular congestion without frank interstitial edema, mild bibasilar opacities likely atelectasis CT head with no acute intracranial findings  [name]-patient had hemodialysis yesterday.  Her mentation is at baseline.  Answers appropriately.  Consultants:  Nephrology  Procedures: Hemodialysis  Antimicrobials:  Ceftriaxone   Subjective: Complaining of right shoulder pain which is chronic.  No chest pain or shortness of breath.  No other symptoms.  Objective: Vitals:   03/29/21 1805 03/29/21 1815 03/29/21 1957 03/30/21 0651  BP: (!) 133/52 (!) 136/47 (!) 147/52 (!) 125/49  Pulse:   79 77  Resp:   16 16  Temp: 99.7 F (37.6 C)  99.2 F (37.3 C) 98.1 F (36.7 C)  TempSrc: Oral  Oral   SpO2:  100% 96% 100%  Weight:      Height:        Intake/Output Summary (Last 24 hours) at [name]/2022 0806 Last data filed at 03/29/2021 1805 Gross per 24 hour  Intake 637.5 ml  Output 3500 ml  Net -2862.5 ml   Filed Weights   03/29/21 0037  Weight: 79.3 kg    Examination:  General exam: Appears calm and comfortable  Respiratory system: Clear to auscultation. Respiratory effort normal. Cardiovascular system: S1 & S2 heard, RRR. No JVD, murmurs, rubs, gallops or clicks.   Gastrointestinal system: Abdomen is nondistended, soft and nontender. Normal bowel sounds heard. Central nervous system: Alert and oriented.  Answers appropriately, grossly intact Extremities: Chronic skin changes with mild edema Skin: Warm dry Psychiatry: Mood & affect appropriate.     Data Reviewed: I have personally reviewed following labs and imaging studies  CBC: Recent Labs  Lab 03/29/21 0025  WBC 14.0*  NEUTROABS 11.8*  HGB 11.4*  HCT 35.6*  MCV 84.2  PLT 253*   Basic Metabolic Panel: Recent Labs  Lab 03/29/21 0025  NA 128*  K 4.0  CL 92*  CO2 28  GLUCOSE 288*  BUN 33*  CREATININE 4.21*  CALCIUM 8.6*   GFR: Estimated Creatinine Clearance: 14.6 mL/min (A) (by C-G formula based on SCr of 4.21 mg/dL (H)). Liver Function Tests: Recent Labs  Lab 03/29/21 0025  AST 19  ALT 12  ALKPHOS 104  BILITOT 0.8  PROT 6.7  ALBUMIN 2.7*   No results for input(s): LIPASE, AMYLASE in the last 168 hours. No results for input(s): AMMONIA in the last 168 hours. Coagulation Profile: Recent Labs  Lab 03/29/21 0021  INR 1.3*   Cardiac Enzymes: No results for input(s): CKTOTAL, CKMB, CKMBINDEX, TROPONINI in the last 168 hours. BNP (last 3 results) No results for input(s): PROBNP in the last 8760 hours. HbA1C: Recent Labs    03/29/21 0219  HGBA1C 6.2*   CBG: Recent Labs  Lab 03/29/21  0036 03/29/21 0847 03/29/21 1236 03/29/21 1952  GLUCAP 259* 171* 108* 114*   Lipid Profile: No results for input(s): CHOL, HDL, LDLCALC, TRIG, CHOLHDL, LDLDIRECT in the last 72 hours. Thyroid Function Tests: No results for input(s): TSH, T4TOTAL, FREET4, T3FREE, THYROIDAB in the last 72 hours. Anemia Panel: No results for input(s): VITAMINB12, FOLATE, FERRITIN, TIBC, IRON, RETICCTPCT in the last 72 hours. Sepsis Labs: Recent Labs  Lab 03/29/21 0025  PROCALCITON 1.86  LATICACIDVEN 1.7    Recent Results (from the past 240 hour(s))  Resp Panel by RT-PCR (Flu A&B, Covid)  Nasopharyngeal Swab     Status: None   Collection Time: 03/29/21 12:25 AM   Specimen: Nasopharyngeal Swab; Nasopharyngeal(NP) swabs in vial transport medium  Result Value Ref Range Status   SARS Coronavirus 2 by RT PCR NEGATIVE NEGATIVE Final    Comment: (NOTE) SARS-CoV-2 target nucleic acids are NOT DETECTED.  The SARS-CoV-2 RNA is generally detectable in upper respiratory specimens during the acute phase of infection. The lowest concentration of SARS-CoV-2 viral copies this assay can detect is 138 copies/mL. A negative result does not preclude SARS-Cov-2 infection and should not be used as the sole basis for treatment or other patient management decisions. A negative result may occur with  improper specimen collection/handling, submission of specimen other than nasopharyngeal swab, presence of viral mutation(s) within the areas targeted by this assay, and inadequate number of viral copies(<138 copies/mL). A negative result must be combined with clinical observations, patient history, and epidemiological information. The expected result is Negative.  Fact Sheet for Patients:  EntrepreneurPulse.com.au  Fact Sheet for Healthcare Providers:  IncredibleEmployment.be  This test is no t yet approved or cleared by the [place] FDA and  has been authorized for detection and/or diagnosis of SARS-CoV-2 by FDA under an Emergency Use Authorization (EUA). This EUA will remain  in effect (meaning this test can be used) for the duration of the COVID-19 declaration under Section 564(b)(1) of the Act, 21 U.S.C.section 360bbb-3(b)(1), unless the authorization is terminated  or revoked sooner.       Influenza A by PCR NEGATIVE NEGATIVE Final   Influenza B by PCR NEGATIVE NEGATIVE Final    Comment: (NOTE) The Xpert Xpress SARS-CoV-2/FLU/RSV plus assay is intended as an aid in the diagnosis of influenza from Nasopharyngeal swab specimens and should not be  used as a sole basis for treatment. Nasal washings and aspirates are unacceptable for Xpert Xpress SARS-CoV-2/FLU/RSV testing.  Fact Sheet for Patients: EntrepreneurPulse.com.au  Fact Sheet for Healthcare Providers: IncredibleEmployment.be  This test is not yet approved or cleared by the [place] FDA and has been authorized for detection and/or diagnosis of SARS-CoV-2 by FDA under an Emergency Use Authorization (EUA). This EUA will remain in effect (meaning this test can be used) for the duration of the COVID-19 declaration under Section 564(b)(1) of the Act, 21 U.S.C. section 360bbb-3(b)(1), unless the authorization is terminated or revoked.  Performed at [facility], [address] [place], [place] [address]   Blood Culture (routine x 2)     Status: None (Preliminary result)   Collection Time: 03/29/21 12:28 AM   Specimen: BLOOD  Result Value Ref Range Status   Specimen Description BLOOD LEFT FOREARM  Final   Special Requests   Final    BOTTLES DRAWN AEROBIC AND ANAEROBIC Blood Culture adequate volume   Culture   Final    NO GROWTH 1 DAY Performed at [facility], [address] [place], [place] [address]  Report Status PENDING  Incomplete  Blood Culture (routine x 2)     Status: None (Preliminary result)   Collection Time: 03/29/21 12:28 AM   Specimen: BLOOD  Result Value Ref Range Status   Specimen Description BLOOD LEFT THUMB  Final   Special Requests   Final    BOTTLES DRAWN AEROBIC AND ANAEROBIC Blood Culture results may not be optimal due to an inadequate volume of blood received in culture bottles   Culture   Final    NO GROWTH 1 DAY Performed at [facility], [address], [place], [place] [address]    Report Status PENDING  Incomplete  MRSA Next Gen by PCR, Nasal     Status: None   Collection Time: 03/29/21  7:05 PM   Specimen: Nasal Mucosa; Nasal Swab  Result Value Ref Range Status    MRSA by PCR Next Gen NOT DETECTED NOT DETECTED Final    Comment: (NOTE) The GeneXpert MRSA Assay (FDA approved for NASAL specimens only), is one component of a comprehensive MRSA colonization surveillance program. It is not intended to diagnose MRSA infection nor to guide or monitor treatment for MRSA infections. Test performance is not FDA approved in patients less than 11 years old. Performed at [facility], [address] [place], [place] [address]          Radiology Studies: CT Head Wo Contrast  Result Date: 03/29/2021 CLINICAL DATA:  Altered mental status EXAM: CT HEAD WITHOUT CONTRAST TECHNIQUE: Contiguous axial images were obtained from the base of the skull through the vertex without intravenous contrast. COMPARISON:  09/17/2020 FINDINGS: Brain: No evidence of acute infarction, hemorrhage, hydrocephalus, extra-axial collection or mass lesion/mass effect. Vascular: No hyperdense vessel or unexpected calcification. Skull: Normal. Negative for fracture or focal lesion. Sinuses/Orbits: The visualized paranasal sinuses are essentially clear. The mastoid air cells are unopacified. Other: None. IMPRESSION: Normal head CT. Electronically Signed   By: Julian Hy M.D.   On: 03/29/2021 01:12   DG Chest Port 1 View  Result Date: 03/29/2021 CLINICAL DATA:  Sepsis EXAM: PORTABLE CHEST 1 VIEW COMPARISON:  03/17/2021 FINDINGS: Cardiomegaly with pulmonary vascular congestion. No frank interstitial edema. Mild bibasilar opacities, likely atelectasis. Suspected small left pleural effusion. No pneumothorax. Left IJ dual lumen dialysis catheter terminating in the upper right atrium. Left subclavian stent. IMPRESSION: Cardiomegaly with pulmonary vascular congestion. No frank interstitial edema. Small left pleural effusion. Mild bibasilar opacities, likely atelectasis. Electronically Signed   By: Julian Hy M.D.   On: 03/29/2021 00:44        Scheduled Meds:  Chlorhexidine  Gluconate Cloth  6 each Topical Q0600   feeding supplement (NEPRO CARB STEADY)  237 mL Oral BID BM   heparin  5,000 Units Subcutaneous Q8H   insulin aspart  0-5 Units Subcutaneous QHS   insulin aspart  0-9 Units Subcutaneous TID WC   multivitamin  1 tablet Oral QHS   Continuous Infusions:  sodium chloride 50 mL/hr at 03/29/21 1901   cefTRIAXone (ROCEPHIN)  IV Stopped (03/29/21 9702)    Assessment & Plan:   Principal Problem:   Acute metabolic encephalopathy Active Problems:   End-stage renal disease on hemodialysis (HCC)   Diabetes mellitus, type II (HCC)   HTN (hypertension)   Coronary artery disease involving native coronary artery of native heart without angina pectoris   UTI (urinary tract infection)   60 year old female with history of ESRD on HD TTS, DM, chronic diastolic heart failure, CAD, PVD, presenting with confusion, decreased  oral intake and not acting herself.       UTI with possible sepsis Acute metabolic encephalopathy -Patient with fever, leukocytosis of 14,000 and pyuria on urinalysis, presenting with altered mental status 7/9 MS improved at baseline. Urine culture with more than 100,000 E. coli, awaiting sensitivities Blood cultures to date pending DC IV fluid     End-stage renal disease on hemodialysis ([place]) Status post HD on 7/8 Nephrology following Diffuse protein drink    Chronic diastolic heart failure Appears to be euvolemic Continue to monitor I's and O's On dialysis     Diabetes mellitus, type II (HCC) BG overall stable.  This a.m. was mildly hypoglycemic at 67 Encouraged p.o. intake A1c 6.2     HTN (hypertension) BP stable Need to monitor     Coronary artery disease involving native coronary artery of native heart without angina pectoris - Had no complaints of chest pain, troponin mildly elevated at 30 and EKG nonacute Mild troponin elevation likely due to demand ischemia.  Trended down.   Consult PT OT  DVT prophylaxis:  Heparin Code Status: Full Family Communication: None at bedside Disposition Plan:  Status is: Inpatient  Remains inpatient appropriate because:Inpatient level of care appropriate due to severity of illness  Dispo: The patient is from: Home              Anticipated d/c is to: Home              Patient currently is not medically stable to d/c.   Difficult to place patient Yes            LOS: 1 day   Time spent: 35 minutes with more than 50% on [place], MD [facility] Pager [phone]  If 7PM-7AM, please contact night-coverage [name]/2022, 8:06 AM

## 2021-03-31 ENCOUNTER — Inpatient Hospital Stay: Payer: Medicare Other

## 2021-03-31 LAB — BASIC METABOLIC PANEL
Anion gap: 10 (ref 5–15)
BUN: 37 mg/dL — ABNORMAL HIGH (ref 8–23)
CO2: 26 mmol/L (ref 22–32)
Calcium: 7.7 mg/dL — ABNORMAL LOW (ref 8.9–10.3)
Chloride: 92 mmol/L — ABNORMAL LOW (ref 98–111)
Creatinine, Ser: 4.37 mg/dL — ABNORMAL HIGH (ref 0.44–1.00)
GFR, Estimated: 11 mL/min — ABNORMAL LOW (ref >60)
Glucose, Bld: 99 mg/dL (ref 70–99)
Potassium: 4 mmol/L (ref 3.5–5.1)
Sodium: 128 mmol/L — ABNORMAL LOW (ref 135–145)

## 2021-03-31 LAB — URINE CULTURE: Culture: >=100000 — AB

## 2021-03-31 LAB — GLUCOSE, CAPILLARY
Glucose-Capillary: 125 mg/dL — ABNORMAL HIGH (ref 70–99)
Glucose-Capillary: 116 mg/dL — ABNORMAL HIGH (ref 70–99)
Glucose-Capillary: 86 mg/dL (ref 70–99)
Glucose-Capillary: 125 mg/dL — ABNORMAL HIGH (ref 70–99)

## 2021-03-31 LAB — CBC
HCT: 30.6 % — ABNORMAL LOW (ref 36.0–46.0)
Hemoglobin: 10.4 g/dL — ABNORMAL LOW (ref 12.0–15.0)
MCH: 28 pg (ref 26.0–34.0)
MCHC: 34 g/dL (ref 30.0–36.0)
MCV: 82.3 fL (ref 80.0–100.0)
Platelets: 107 10*3/uL — ABNORMAL LOW (ref 150–400)
RBC: 3.72 MIL/uL — ABNORMAL LOW (ref 3.87–5.11)
RDW: 23.8 % — ABNORMAL HIGH (ref 11.5–15.5)
WBC: 11.7 10*3/uL — ABNORMAL HIGH (ref 4.0–10.5)
nRBC: 0 % (ref 0.0–0.2)

## 2021-03-31 MED ORDER — SEVELAMER CARBONATE 800 MG PO TABS: 800.0000 mg | ORAL_TABLET | Freq: Three times a day (TID) | ORAL | Status: DC

## 2021-03-31 MED ORDER — PANTOPRAZOLE SODIUM 40 MG PO TBEC
40.0000 mg | DELAYED_RELEASE_TABLET | Freq: Every day | ORAL | Status: DC
  Administered 2021-03-31 – 2021-04-02 (×3): 40 mg via ORAL
  Filled 2021-03-31 (×3): qty 1

## 2021-03-31 MED ORDER — SEVELAMER CARBONATE 800 MG PO TABS
800.0000 mg | ORAL_TABLET | Freq: Three times a day (TID) | ORAL | Status: DC
  Administered 2021-03-31 – 2021-04-02 (×8): 800 mg via ORAL
  Filled 2021-03-31 (×9): qty 1

## 2021-03-31 MED ORDER — ASPIRIN EC 81 MG PO TBEC
81.0000 mg | DELAYED_RELEASE_TABLET | Freq: Every day | ORAL | Status: DC
  Administered 2021-03-31 – 2021-04-02 (×3): 81 mg via ORAL
  Filled 2021-03-31 (×3): qty 1

## 2021-03-31 MED ORDER — PIPERACILLIN-TAZOBACTAM IN DEX 2-0.25 GM/50ML IV SOLN
2.2500 g | Freq: Three times a day (TID) | INTRAVENOUS | Status: DC
  Administered 2021-03-31 – 2021-04-02 (×7): 2.25 g via INTRAVENOUS
  Filled 2021-03-31 (×9): qty 50

## 2021-03-31 NOTE — TOC Initial Note (Signed)
Transition of Care [facility]) - Initial/Assessment Note    Patient Details  Name: [name] MRN: [id number] Date of Birth: [birth year]  Transition of Care [facility]) CM/SW Contact:    Elliot Gurney [place], [place] Phone Number: [phone] 03/31/2021, 3:47 PM  Clinical Narrative:                 Patient is a 60 year old female and is alert and oriented. Patient states that she lives alone, has dialysis 3x per week-[facility] in [place] ([place], [place], Friday). Patient's primary care doctor is Dr. Posey Pronto at [facility]. [facility] [place]  Patient has no services in the home at this time but is working on getting an aid for 2 hours a day. Patient describes having a positive support system including her son and driver who not only transports her to dialysis and other medical appointments but helps with other errands as well. Patient refuses SNF at this time, however is [facility] post discharge.  Transition of Care to work on setting up [facility] services for patient.   Jinnifer Montejano, LCSW Transition of Care [phone]   Expected Discharge Plan: [place] Barriers to Discharge: Continued Medical Work up   Patient Goals and CMS Choice Patient states their goals for this hospitalization and ongoing recovery are:: "I don't want to go to another nursing home"      Expected Discharge Plan and Services Expected Discharge Plan: [place] In-house Referral: Clinical Social Work   Post Acute Care Choice: [place] arrangements for the past 2 months: Apartment                                      Prior Living Arrangements/Services Living arrangements for the past 2 months: Apartment Lives with:: Self Patient language and need for interpreter reviewed:: No Do you feel safe going back to the place where you live?: Yes      Need for Family Participation in Patient Care: Yes (Comment) Care giver  support system in place?: Yes (comment) Current home services: DME Criminal Activity/Legal Involvement Pertinent to Current Situation/Hospitalization: No - Comment as needed  Activities of Daily Living Home Assistive Devices/Equipment: Dentures (specify type), Walker (specify type), Cane (specify quad or straight), Oxygen ADL Screening (condition at time of admission) Patient's cognitive ability adequate to safely complete daily activities?: Yes Is the patient deaf or have difficulty hearing?: Yes Does the patient have difficulty seeing, even when wearing glasses/contacts?: No Does the patient have difficulty concentrating, remembering, or making decisions?: No Patient able to express need for assistance with ADLs?: Yes Does the patient have difficulty dressing or bathing?: Yes Independently performs ADLs?: No Communication: Independent Dressing (OT): Needs assistance Is this a change from baseline?: Pre-admission baseline Grooming: Needs assistance Is this a change from baseline?: Pre-admission baseline Feeding: Independent Bathing: Needs assistance Is this a change from baseline?: Pre-admission baseline Toileting: Needs assistance Is this a change from baseline?: Pre-admission baseline In/Out Bed: Needs assistance Is this a change from baseline?: Pre-admission baseline Walks in Home: Needs assistance Is this a change from baseline?: Pre-admission baseline Does the patient have difficulty walking or climbing stairs?: Yes Weakness of Legs: Both Weakness of Arms/Hands: Both  Permission Sought/Granted      Share Information with NAME: Lakenzie Mcclafferty     Permission granted to share info w  Relationship: son [phone]     Emotional Assessment Appearance:: Appears older than stated age Attitude/Demeanor/Rapport: Engaged Affect (typically observed): Accepting, Adaptable Orientation: : Oriented to Self, Oriented to Place, Oriented to  Time, Oriented to Situation Alcohol / Substance  Use: Not Applicable Psych Involvement: No (comment)  Admission diagnosis:  Acute UTI [N39.0] Fever, unspecified fever cause [R50.9] Altered mental status, unspecified altered mental status type [X51.70] Acute metabolic encephalopathy [Y17.49] Patient Active Problem List   Diagnosis Date Noted   Acute metabolic encephalopathy 44/96/7591   UTI (urinary tract infection) 03/29/2021   Chronic respiratory failure with hypoxia ([place]) 12/18/2020   Superior vena caval syndrome 12/14/2020   Chronic diastolic CHF (congestive heart failure) ([place]) 12/04/2020   Bradycardia 09/17/2020   Advanced care planning/counseling discussion    Discitis of thoracic region    Fluid overload 08/10/2020   HLD (hyperlipidemia) 08/10/2020   Chest pain 08/10/2020   CAD (coronary artery disease) 08/10/2020   GERD (gastroesophageal reflux disease) 08/10/2020   Obesity (BMI 30-39.9) 06/06/2020   Anemia in ESRD (end-stage renal disease) ([place]) 05/31/2020   Type 1 diabetes mellitus ([place]) 05/31/2020   End-stage renal disease ([place]) 05/31/2020   Hypertensive disorder 05/31/2020   Arm pain 05/21/2020   Cellulitis of right leg 05/02/2020   ESRD (end stage renal disease) ([place]) 04/13/2020   SOBOE (shortness of breath on exertion) 02/08/2020   Hematoma of arm, left, subsequent encounter 11/24/2019   Respiratory failure (HCC)    Shortness of breath    Complication of arteriovenous dialysis fistula 11/13/2019   PVD (peripheral vascular disease) ([place]) 11/13/2019   Severe anemia 11/13/2019   Pressure injury of skin 11/13/2019   ESRD on hemodialysis ([place])    Hemorrhagic shock ([place]) 11/11/2019   Leg pain 10/02/2019   CAP (community acquired pneumonia) 10/01/2019   Atherosclerosis of native arteries of extremity with intermittent claudication ([place]) 05/01/2019   Right-sided headache    COPD (chronic obstructive pulmonary disease) ([place]) 10/06/2018   Syncope 10/04/2018   Symptomatic anemia 06/18/2018   Gastroparesis due to DM  ([place]) 63/84/6659   Complication of vascular access for dialysis 12/04/2017   Osteomyelitis ([place]) 09/30/2017   Carotid stenosis 06/18/2017   Bilateral carotid artery stenosis 06/16/2017   Benign essential HTN 05/19/2017   CKD (chronic kidney disease) stage 5, GFR less than 15 ml/min (HCC) 05/19/2017   Hyperlipidemia, mixed 05/19/2017   Shortness of breath 05/04/2017   Rash, skin 12/16/2016   Cellulitis of lower extremity 07/29/2016   Chronic venous insufficiency 07/29/2016   Lymphedema 07/29/2016   TIA (transient ischemic attack) 04/21/2016   Altered mental status 04/08/2016   Hyperammonemia ([place]) 04/08/2016   Elevated troponin 04/08/2016   Depression 04/08/2016   Depression, major, recurrent, severe with psychosis ([place]) 04/08/2016   Blood in stool    Intractable cyclical vomiting with nausea    Reflux esophagitis    Gastritis    Generalized abdominal pain    Uncontrollable vomiting    Major depressive disorder, recurrent episode, moderate ([place]) 03/15/2016   Adjustment disorder with mixed anxiety and depressed mood 03/15/2016   Malnutrition of moderate degree 12/01/2015   Renal mass    Dyspnea    Acute renal failure (HCC)    Respiratory failure (HCC)    High temperature 11/14/2015   Encounter for central line placement    Encounter for orogastric (OG) tube placement    Nausea 11/12/2015   Hyperkalemia 10/03/2015   Diarrhea, unspecified 07/22/2015   Pneumonia 05/21/2015   Hypoglycemia 04/24/2015   Unresponsiveness  04/24/2015   Sinus bradycardia 04/24/2015   Hypothermia 04/24/2015   Acute respiratory failure ([place]) 04/24/2015   Acute on chronic diastolic CHF (congestive heart failure) ([place]) 04/05/2015   Diabetic gastroparesis ([place]) 04/05/2015   Hypokalemia 04/05/2015   Generalized weakness 04/05/2015   Acute pulmonary edema ([place]) 04/03/2015   Nausea and vomiting 04/03/2015   Hypoglycemia associated with diabetes ([place]) 04/03/2015   Anemia of chronic disease 04/03/2015    Secondary hyperparathyroidism ([place]) 04/03/2015   Pressure ulcer 04/02/2015   Acute respiratory failure with hypoxia ([place]) 04/01/2015   Adjustment disorder with anxiety 03/14/2015   Somatic symptom disorder, mild 03/08/2015   Coronary artery disease involving native coronary artery of native heart without angina pectoris    Nausea & vomiting 03/06/2015   Abdominal pain 03/06/2015   Diabetes mellitus, type II ([place]) 03/06/2015   HTN (hypertension) 03/06/2015   Gastroparesis 02/24/2015   Pleural effusion 02/19/2015   Bacteremia due to Enterococcus 02/19/2015   End-stage renal disease on hemodialysis ([place]) 02/19/2015   Diarrhea 08/01/2014   PCP:  Denton Lank, MD Pharmacy:   [id number] [place], [address] [place] [address] Phone: [phone] Fax: [phone]     Social Determinants of Health (SDOH) Interventions    Readmission Risk Interventions Readmission Risk Prevention Plan 12/11/2020 09/21/2020 08/14/2020  Transportation Screening Complete Complete -  Medication Review Press photographer) Complete Complete -  PCP or Specialist appointment within 3-5 days of discharge Complete Complete Complete  HRI or Home Care Consult Complete Complete Complete  SW Recovery Care/Counseling Consult Complete Complete Complete  Palliative Care Screening Not Applicable Complete Complete  [place] Not Complete Complete Not Applicable  SNF Comments working on placement - -  Some recent data might be hidden

## 2021-04-01 ENCOUNTER — Inpatient Hospital Stay: Payer: Medicare Other | Admitting: Oncology

## 2021-04-01 ENCOUNTER — Inpatient Hospital Stay: Payer: Medicare Other

## 2021-04-01 ENCOUNTER — Encounter: Payer: Self-pay | Admitting: Internal Medicine

## 2021-04-01 LAB — GLUCOSE, CAPILLARY
Glucose-Capillary: 128 mg/dL — ABNORMAL HIGH (ref 70–99)
Glucose-Capillary: 93 mg/dL (ref 70–99)
Glucose-Capillary: 144 mg/dL — ABNORMAL HIGH (ref 70–99)

## 2021-04-01 LAB — RENAL FUNCTION PANEL
Albumin: 2 g/dL — ABNORMAL LOW (ref 3.5–5.0)
Anion gap: 9 (ref 5–15)
BUN: 43 mg/dL — ABNORMAL HIGH (ref 8–23)
CO2: 26 mmol/L (ref 22–32)
Calcium: 7.6 mg/dL — ABNORMAL LOW (ref 8.9–10.3)
Chloride: 94 mmol/L — ABNORMAL LOW (ref 98–111)
Creatinine, Ser: 4.95 mg/dL — ABNORMAL HIGH (ref 0.44–1.00)
GFR, Estimated: 9 mL/min — ABNORMAL LOW (ref >60)
Glucose, Bld: 135 mg/dL — ABNORMAL HIGH (ref 70–99)
Phosphorus: 1.9 mg/dL — ABNORMAL LOW (ref 2.5–4.6)
Potassium: 4 mmol/L (ref 3.5–5.1)
Sodium: 129 mmol/L — ABNORMAL LOW (ref 135–145)

## 2021-04-01 LAB — CBC
HCT: 28.6 % — ABNORMAL LOW (ref 36.0–46.0)
Hemoglobin: 9.7 g/dL — ABNORMAL LOW (ref 12.0–15.0)
MCH: 27.8 pg (ref 26.0–34.0)
MCHC: 33.9 g/dL (ref 30.0–36.0)
MCV: 81.9 fL (ref 80.0–100.0)
Platelets: 100 10*3/uL — ABNORMAL LOW (ref 150–400)
RBC: 3.49 MIL/uL — ABNORMAL LOW (ref 3.87–5.11)
RDW: 23.2 % — ABNORMAL HIGH (ref 11.5–15.5)
WBC: 8 10*3/uL (ref 4.0–10.5)
nRBC: 0 % (ref 0.0–0.2)

## 2021-04-01 MED ORDER — HEPARIN SODIUM (PORCINE) 1000 UNIT/ML DIALYSIS
1000.0000 [IU] | INTRAMUSCULAR | Status: DC | PRN
  Filled 2021-04-01: qty 1

## 2021-04-01 MED ORDER — LIDOCAINE HCL (PF) 1 % IJ SOLN
5.0000 mL | INTRAMUSCULAR | Status: DC | PRN
  Filled 2021-04-01: qty 5

## 2021-04-01 MED ORDER — ALTEPLASE 2 MG IJ SOLR
2.0000 mg | Freq: Once | INTRAMUSCULAR | Status: DC | PRN
  Filled 2021-04-01: qty 2

## 2021-04-01 MED ORDER — IOHEXOL 300 MG/ML  SOLN
75.0000 mL | Freq: Once | INTRAMUSCULAR | Status: AC | PRN
  Administered 2021-04-01: 11:00:00 75 mL via INTRAVENOUS

## 2021-04-01 MED ORDER — LIDOCAINE-PRILOCAINE 2.5-2.5 % EX CREA
1.0000 "application " | TOPICAL_CREAM | CUTANEOUS | Status: DC | PRN
  Filled 2021-04-01: qty 5

## 2021-04-01 MED ORDER — SODIUM CHLORIDE 0.9 % IV SOLN: 100.0000 mL | INTRAVENOUS | Status: DC | PRN

## 2021-04-01 MED ORDER — CHLORHEXIDINE GLUCONATE CLOTH 2 % EX PADS
6.0000 | MEDICATED_PAD | Freq: Every day | CUTANEOUS | Status: DC
  Administered 2021-04-02: 06:00:00 6 via TOPICAL

## 2021-04-01 NOTE — Progress Notes (Signed)
PROGRESS NOTE    [name]  ZOX:[id number] DOB: 27-Aug-1961 DOA: 03/29/2021 PCP: Denton Lank, MD    Brief Narrative:  [name] is a 60 y.o. female with medical history significant for ESRD on HD TTS, DM, chronic diastolic heart failure, CAD, PVD, who was brought in from home with a complaint of confusion, decreased oral intake and not acting herself.  Most of the history is taken from ER notes this patient is very lethargic and unable to contribute to history.  She has had chills but no fever but otherwise has had no cough or complaints of chest pain or shortness of breath, vomiting or diarrhea.  At baseline she is able to ambulate with walker and is usually alert and oriented. Chest x-ray with cardiomegaly and pulmonary vascular congestion without frank interstitial edema, mild bibasilar opacities likely atelectasis CT head with no acute intracranial findings  7/9-patient had hemodialysis yesterday.  Her mentation is at baseline.  Answers appropriately. 7/10-c/o RUE pain/swelling. Keeping it elevated. 7/11 discussed with patient the venous ultrasound and x-ray.  Her swelling of right upper extremity has gone down with elevation.  But still has pain. Reports the pain meds making her sleepy. Has HD today.  Discussed the [place] finding of neck mass/LAD.If ok to get CT scan for further evaluation. She was ok with this. Asked Nephrology about ct contrast with coordination of HD. Will have HD after ct scan completed.   Procedures: Hemodialysis  Antimicrobials:  Ceftriaxone... [place] on 7/10 Zosyn started on 7/10.....>>>>   Subjective: No cp, or sob  Objective: Vitals:   04/01/21 1415 04/01/21 1430 04/01/21 1445 04/01/21 1500  BP: (!) 146/63 (!) 152/65 (!) 151/60 (!) 157/62  Pulse:      Resp: 12 10 17 12   Temp:      TempSrc:      SpO2:      Weight:      Height:        Intake/Output Summary (Last 24 hours) at 04/01/2021 1513 Last data filed at 04/01/2021 0600 Gross per 24 hour  Intake  100 ml  Output --  Net 100 ml   Filed Weights   03/29/21 0037  Weight: 79.3 kg    Examination: NAD, calm CTA no wheeze rales rhonchi  regular S1-S2 no gallops Soft benign positive bowel sounds Chronic edema and skin changes Awake and alert, grossly intact     Data Reviewed: I have personally reviewed following labs and imaging studies  CBC: Recent Labs  Lab 03/29/21 0025 03/31/21 1440 04/01/21 1345  WBC 14.0* 11.7* 8.0  NEUTROABS 11.8*  --   --   HGB 11.4* 10.4* 9.7*  HCT 35.6* 30.6* 28.6*  MCV 84.2 82.3 81.9  PLT 138* 107* 811*   Basic Metabolic Panel: Recent Labs  Lab 03/29/21 0025 03/30/21 0906 03/31/21 1440 04/01/21 0829  NA 128* 130* 128* 129*  K 4.0 3.7 4.0 4.0  CL 92* 96* 92* 94*  CO2 28 27 26 26   GLUCOSE 288* 67* 99 135*  BUN 33* 27* 37* 43*  CREATININE 4.21* 3.60* 4.37* 4.95*  CALCIUM 8.6* 7.5* 7.7* 7.6*  PHOS  --   --   --  1.9*   GFR: Estimated Creatinine Clearance: 12.4 mL/min (A) (by C-G formula based on SCr of 4.95 mg/dL (H)). Liver Function Tests: Recent Labs  Lab 03/29/21 0025 04/01/21 0829  AST 19  --   ALT 12  --   ALKPHOS 104  --   BILITOT 0.8  --  PROT 6.7  --   ALBUMIN 2.7* 2.0*   No results for input(s): LIPASE, AMYLASE in the last 168 hours. No results for input(s): AMMONIA in the last 168 hours. Coagulation Profile: Recent Labs  Lab 03/29/21 0021  INR 1.3*   Cardiac Enzymes: No results for input(s): CKTOTAL, CKMB, CKMBINDEX, TROPONINI in the last 168 hours. BNP (last 3 results) No results for input(s): PROBNP in the last 8760 hours. HbA1C: No results for input(s): HGBA1C in the last 72 hours.  CBG: Recent Labs  Lab 03/31/21 0809 03/31/21 1209 03/31/21 1611 03/31/21 2027 04/01/21 0800  GLUCAP 125* 116* 86 125* 128*   Lipid Profile: No results for input(s): CHOL, HDL, LDLCALC, TRIG, CHOLHDL, LDLDIRECT in the last 72 hours. Thyroid Function Tests: No results for input(s): TSH, T4TOTAL, FREET4, T3FREE,  THYROIDAB in the last 72 hours. Anemia Panel: No results for input(s): VITAMINB12, FOLATE, FERRITIN, TIBC, IRON, RETICCTPCT in the last 72 hours. Sepsis Labs: Recent Labs  Lab 03/29/21 0025  PROCALCITON 1.86  LATICACIDVEN 1.7    Recent Results (from the past 240 hour(s))  Urine culture     Status: Abnormal   Collection Time: 03/29/21 12:20 AM   Specimen: In/Out Cath Urine  Result Value Ref Range Status   Specimen Description   Final    IN/OUT CATH URINE Performed at [facility], [address] [place], [place] [address]    Special Requests   Final    NONE Performed at [facility], [address] [place], [place] [address]    Culture (A)  Final    >=100,000 COLONIES/mL ESCHERICHIA COLI Confirmed Extended Spectrum Beta-Lactamase Producer (ESBL).  In bloodstream infections from ESBL organisms, carbapenems are preferred over piperacillin/tazobactam. They are shown to have a lower risk of mortality.    Report Status 03/31/2021 FINAL  Final   Organism ID, Bacteria ESCHERICHIA COLI (A)  Final      Susceptibility   Escherichia coli - MIC*    AMPICILLIN >=32 RESISTANT Resistant     CEFAZOLIN >=64 RESISTANT Resistant     CEFEPIME 16 RESISTANT Resistant     CEFTRIAXONE >=64 RESISTANT Resistant     CIPROFLOXACIN >=4 RESISTANT Resistant     GENTAMICIN 2 SENSITIVE Sensitive     IMIPENEM <=0.25 SENSITIVE Sensitive     NITROFURANTOIN <=16 SENSITIVE Sensitive     TRIMETH/SULFA <=20 SENSITIVE Sensitive     AMPICILLIN/SULBACTAM >=32 RESISTANT Resistant     PIP/TAZO 8 SENSITIVE Sensitive     * >=100,000 COLONIES/mL ESCHERICHIA COLI  Resp Panel by RT-PCR (Flu A&B, Covid) Nasopharyngeal Swab     Status: None   Collection Time: 03/29/21 12:25 AM   Specimen: Nasopharyngeal Swab; Nasopharyngeal(NP) swabs in vial transport medium  Result Value Ref Range Status   SARS Coronavirus 2 by RT PCR NEGATIVE NEGATIVE Final    Comment: (NOTE) SARS-CoV-2 target nucleic acids are  NOT DETECTED.  The SARS-CoV-2 RNA is generally detectable in upper respiratory specimens during the acute phase of infection. The lowest concentration of SARS-CoV-2 viral copies this assay can detect is 138 copies/mL. A negative result does not preclude SARS-Cov-2 infection and should not be used as the sole basis for treatment or other patient management decisions. A negative result may occur with  improper specimen collection/handling, submission of specimen other than nasopharyngeal swab, presence of viral mutation(s) within the areas targeted by this assay, and inadequate number of viral copies(<138 copies/mL). A negative result must be combined with clinical observations, patient history, and epidemiological information.  The expected result is Negative.  Fact Sheet for Patients:  EntrepreneurPulse.com.au  Fact Sheet for Healthcare Providers:  IncredibleEmployment.be  This test is no t yet approved or cleared by the [place] FDA and  has been authorized for detection and/or diagnosis of SARS-CoV-2 by FDA under an Emergency Use Authorization (EUA). This EUA will remain  in effect (meaning this test can be used) for the duration of the COVID-19 declaration under Section 564(b)(1) of the Act, 21 U.S.C.section 360bbb-3(b)(1), unless the authorization is terminated  or revoked sooner.       Influenza A by PCR NEGATIVE NEGATIVE Final   Influenza B by PCR NEGATIVE NEGATIVE Final    Comment: (NOTE) The Xpert Xpress SARS-CoV-2/FLU/RSV plus assay is intended as an aid in the diagnosis of influenza from Nasopharyngeal swab specimens and should not be used as a sole basis for treatment. Nasal washings and aspirates are unacceptable for Xpert Xpress SARS-CoV-2/FLU/RSV testing.  Fact Sheet for Patients: EntrepreneurPulse.com.au  Fact Sheet for Healthcare Providers: IncredibleEmployment.be  This test is not  yet approved or cleared by the [place] FDA and has been authorized for detection and/or diagnosis of SARS-CoV-2 by FDA under an Emergency Use Authorization (EUA). This EUA will remain in effect (meaning this test can be used) for the duration of the COVID-19 declaration under Section 564(b)(1) of the Act, 21 U.S.C. section 360bbb-3(b)(1), unless the authorization is terminated or revoked.  Performed at [facility], [address] [place], [place] [address]   Blood Culture (routine x 2)     Status: None (Preliminary result)   Collection Time: 03/29/21 12:28 AM   Specimen: BLOOD  Result Value Ref Range Status   Specimen Description BLOOD LEFT FOREARM  Final   Special Requests   Final    BOTTLES DRAWN AEROBIC AND ANAEROBIC Blood Culture adequate volume   Culture   Final    NO GROWTH 3 DAYS Performed at [facility], [address] [place], [place] [address]    Report Status PENDING  Incomplete  Blood Culture (routine x 2)     Status: None (Preliminary result)   Collection Time: 03/29/21 12:28 AM   Specimen: BLOOD  Result Value Ref Range Status   Specimen Description BLOOD LEFT THUMB  Final   Special Requests   Final    BOTTLES DRAWN AEROBIC AND ANAEROBIC Blood Culture results may not be optimal due to an inadequate volume of blood received in culture bottles   Culture   Final    NO GROWTH 3 DAYS Performed at [facility], [address] [place], [place] [address]    Report Status PENDING  Incomplete  MRSA Next Gen by PCR, Nasal     Status: None   Collection Time: 03/29/21  7:05 PM   Specimen: Nasal Mucosa; Nasal Swab  Result Value Ref Range Status   MRSA by PCR Next Gen NOT DETECTED NOT DETECTED Final    Comment: (NOTE) The GeneXpert MRSA Assay (FDA approved for NASAL specimens only), is one component of a comprehensive MRSA colonization surveillance program. It is not intended to diagnose MRSA infection nor to guide or monitor  treatment for MRSA infections. Test performance is not FDA approved in patients less than 44 years old. Performed at [facility], [address], [place], [place] [address]          Radiology Studies: DG Shoulder Right  Result Date: 03/30/2021 CLINICAL DATA:  Right shoulder pain EXAM: RIGHT SHOULDER - 2+ VIEW COMPARISON:  None. FINDINGS: No acute fracture or dislocation is noted. No soft tissue abnormality is seen. Dialysis catheter is noted from the left as well as stenting in the right arm and left innominate vein. Mild degenerative changes of the glenohumeral joint and acromioclavicular joint are seen. Bony thorax appears within normal limits. IMPRESSION: No acute abnormality noted.  Degenerative changes are seen. Electronically Signed   By: Inez Catalina M.D.   On: 03/30/2021 19:26   CT SOFT TISSUE NECK W CONTRAST  Result Date: 04/01/2021 CLINICAL DATA:  Neck mass, abnormal ultrasound EXAM: CT NECK WITH CONTRAST TECHNIQUE: Multidetector CT imaging of the neck was performed using the standard protocol following the bolus administration of intravenous contrast. CONTRAST:  56mL OMNIPAQUE IOHEXOL 300 MG/ML  SOLN COMPARISON:  Correlation made with upper extremity ultrasound 03/31/2021 FINDINGS: Pharynx and larynx: Unremarkable.  No mass or swelling. Salivary glands: Unremarkable. Thyroid: Unremarkable. Lymph nodes: Enlarged right supraclavicular node measuring 2.1 cm (series 2, image 73). No additional enlarged nodes identified. Vascular: There is narrowing of the central right internal jugular vein. No flow within the central left internal jugular vein. Mixed plaque at the right ICA origin. Left subclavian and brachiocephalic vein stenting. No apparent subclavian flow bilaterally. Given patency on recent ultrasound, presumably due to phase of study. Extensive venous collateral formation. Left IJ approach catheter partially imaged. Calcified plaque at the left ICA origin. Likely similar to  prior CTA. Limited intracranial: No abnormal enhancement. Visualized orbits: No significant abnormality. Mastoids and visualized paranasal sinuses: Left mastoid and middle ear effusions. No significant paranasal sinus opacification. Skeleton: No acute abnormality. Upper chest: Mild interlobular septal thickening may reflect edema. Small left pleural effusion with areas of loculation. Chest wall edema. Other: Subcutaneous edema. IMPRESSION: 2 cm enlarged right supraclavicular node. No additional enlarged nodes are identified. Subcutaneous edema, mild interstitial pulmonary edema, and partially loculated small left pleural effusion suggesting volume overload. Venous collateral formation is noted. There is no apparent flow in the subclavian or central internal jugular veins. May reflect timing of study and reduced flow, noting reported patency on ultrasound. Electronically Signed   By: Macy Mis M.D.   On: 04/01/2021 11:06   US Venous Img Upper Uni Right(DVT)  Result Date: 03/31/2021 CLINICAL DATA:  Right upper extremity over 1 year EXAM: RIGHT UPPER EXTREMITY VENOUS DOPPLER ULTRASOUND TECHNIQUE: Gray-scale sonography with graded compression, as well as color Doppler and duplex ultrasound were performed to evaluate the upper extremity deep venous system from the level of the subclavian vein and including the jugular, axillary, basilic, radial, ulnar and upper cephalic vein. Spectral Doppler was utilized to evaluate flow at rest and with distal augmentation maneuvers. COMPARISON:  08/11/2020 FINDINGS: Contralateral Subclavian Vein: Respiratory phasicity is normal and symmetric with the symptomatic side. No evidence of thrombus. Normal compressibility. Internal Jugular Vein: No evidence of thrombus. Normal compressibility, respiratory phasicity and response to augmentation. Subclavian Vein: No evidence of thrombus. Normal compressibility, respiratory phasicity and response to augmentation. Axillary Vein: No  evidence of thrombus. Normal compressibility, respiratory phasicity and response to augmentation. Cephalic Vein: No evidence of thrombus. Normal compressibility, respiratory phasicity and response to augmentation. Basilic Vein: No evidence of thrombus. Normal compressibility, respiratory phasicity and response to augmentation. Brachial Veins: No evidence of thrombus. Normal compressibility, respiratory phasicity and response to augmentation. Radial Veins: No evidence of thrombus. Normal compressibility, respiratory phasicity and response to augmentation. Ulnar Veins: No evidence of thrombus. Normal compressibility, respiratory phasicity and response to augmentation. Other Findings: 2.0 x 1.4 x 1.1 cm  ovoid hypoechoic structure in the right supraclavicular region is suspicious for abnormally enlarged lymph node. Thrombosed right upper extremity graft is noted. In the right medial upper arm there is a 5.1 x 2.1 x 2.1 cm ovoid hypoechoic mass. This may be related to a thrombosed pseudoaneurysm. IMPRESSION: 1. No right upper extremity DVT. 2. Abnormally enlarged lymph node seen in the right supraclavicular region measuring 2.0 x 1.4 x 1.1 cm should be further evaluated with contrast enhanced soft tissue neck CT. 3. 5.1 x 2.1 x 2.1 cm ovoid hypoechoic mass in the right upper arm is most likely a thrombosed pseudoaneurysm related to occluded dialysis graft. Recommend confirmation with contrast enhanced MRI or CT of the right upper arm. Electronically Signed   By: Miachel Roux M.D.   On: 03/31/2021 13:02        Scheduled Meds:  aspirin EC  81 mg Oral Daily   Chlorhexidine Gluconate Cloth  6 each Topical Q0600   heparin  5,000 Units Subcutaneous Q8H   insulin aspart  0-5 Units Subcutaneous QHS   insulin aspart  0-9 Units Subcutaneous TID WC   lidocaine  1 patch Transdermal Q24H   lipase/protease/amylase  12,000 Units Oral TID AC   multivitamin  1 tablet Oral QHS   pantoprazole  40 mg Oral Daily   sevelamer  carbonate  800 mg Oral TID WC & HS   Continuous Infusions:  sodium chloride     sodium chloride     piperacillin-tazobactam (ZOSYN)  IV 2.25 g (04/01/21 0504)    Assessment & Plan:   Principal Problem:   Acute metabolic encephalopathy Active Problems:   End-stage renal disease on hemodialysis ([place])   Diabetes mellitus, type II (HCC)   HTN (hypertension)   Coronary artery disease involving native coronary artery of native heart without angina pectoris   UTI (urinary tract infection)   60 year old female with history of ESRD on HD TTS, DM, chronic diastolic heart failure, CAD, PVD, presenting with confusion, decreased oral intake and not acting herself.       UTI with possible sepsis Acute metabolic encephalopathy -Patient with fever, leukocytosis of 14,000 and pyuria on urinalysis, presenting with altered mental status MS improved at baseline. Urine culture with more than 100,000 E. coli, awaiting sensitivities 7/11 continue on zosyn Blood cultures to date negative     End-stage renal disease on hemodialysis [facility]) Nephrology following HD today after CT scan of neck   RUE pain/swelling Xr negative for fx Venous US found with changes likely a thrombosed pseudoaneurys related to occluded HD graft. Also found with ovid structure in Rt supraclv region suspicious for abn LN, recommended ct neck with contrast.  Obtained CT neck this am, results reported -there is no apparent flow in the subclavian or central internal jugular veins. May reflect timing of study and reduced flow, noting reported patency on ultrasound. Vascular surgery consulted.Dr. Lucky Cowboy to see pt.     Chronic diastolic heart failure Without acute exacerbation On dialysis Continue to monitor I's and O's      Diabetes mellitus, type II (HCC) BG overall stable Encourage p.o. intake A1c 6.2  Thrombocytopenia- appears to be chronic. Levels varies. Monitor periodically     HTN (hypertension) Bp  stable Continue current management     Coronary artery disease involving native coronary artery of native heart without angina pectoris - Had no complaints of chest pain, troponin mildly elevated at 30 and EKG nonacute Mild troponin elevation likely due to demand ischemia.  Trended down.  Consult PT OT recommends SNF patient refused and wants to go home with home health  DVT prophylaxis: Heparin Code Status: Full Family Communication: None at bedside Disposition Plan:  Status is: Inpatient  Remains inpatient appropriate because:Inpatient level of care appropriate due to severity of illness  Dispo: The patient is from: Home              Anticipated d/c is to: [facility] Refused SNF              Patient currently is not medically stable to d/c.   Difficult to place patient Yes            LOS: 3 days   Time spent: 45 minutes with more than 50% on [place], MD [facility] Pager [phone]  If 7PM-7AM, please contact night-coverage 04/01/2021, 3:13 PM

## 2021-04-01 NOTE — Progress Notes (Signed)
Hemodialysis patient known at [place] 10:45am. Patient transports self, with occasional help from son. Patient stated no dialysis concerns, education provided. Please contact me with any dialysis placement concerns.   Elvera Bicker Dialysis Coordinator [phone]

## 2021-04-01 NOTE — Progress Notes (Addendum)
HD initiated at 1221 using CVC without difficulty. Patient tolerated HD. No c/o offered.  Treatment ended at 1527, report given to primary RN and patient transported back to room in stable condition.  UF goal 2503 achieved

## 2021-04-01 NOTE — Progress Notes (Signed)
[facility]  ROUNDING NOTE   Subjective:   Patient seen resting in bed Denies pain and discomfort  Dialysis scheduled for today After CT with contrast   Objective:  Vital signs in last 24 hours:  Temp:  [98.2 F (36.8 C)-98.9 F (37.2 C)] 98.3 F (36.8 C) (07/11 0759) Pulse Rate:  [69-80] 70 (07/11 0759) Resp:  [16-20] 16 (07/11 0759) BP: (136-151)/(57-72) 147/60 (07/11 0759) SpO2:  [94 %-100 %] 100 % (07/11 0759)  Weight change:  Filed Weights   03/29/21 0037  Weight: 79.3 kg    Intake/Output: I/O last 3 completed shifts: In: 150 [IV Piggyback:150] Out: -    Intake/Output this shift:  No intake/output data recorded.  Physical Exam: General: NAD, resting in bed  Head: Normocephalic, atraumatic. Moist oral mucosal membranes  Eyes: Anicteric  Lungs:  clear, normal effort, normal effort  Heart: Regular rate and rhythm  Abdomen:  Soft, nontender  Extremities:  2+ peripheral edema.  Neurologic: Nonfocal, moving all four extremities  Skin: No lesions  Access: Lt IJ Permcath    Basic Metabolic Panel: Recent Labs  Lab 03/29/21 0025 03/30/21 0906 03/31/21 1440 04/01/21 0829  NA 128* 130* 128* 129*  K 4.0 3.7 4.0 4.0  CL 92* 96* 92* 94*  CO2 28 27 26 26   GLUCOSE 288* 67* 99 135*  BUN 33* 27* 37* 43*  CREATININE 4.21* 3.60* 4.37* 4.95*  CALCIUM 8.6* 7.5* 7.7* 7.6*  PHOS  --   --   --  1.9*     Liver Function Tests: Recent Labs  Lab 03/29/21 0025 04/01/21 0829  AST 19  --   ALT 12  --   ALKPHOS 104  --   BILITOT 0.8  --   PROT 6.7  --   ALBUMIN 2.7* 2.0*    No results for input(s): LIPASE, AMYLASE in the last 168 hours. No results for input(s): AMMONIA in the last 168 hours.  CBC: Recent Labs  Lab 03/29/21 0025 03/31/21 1440  WBC 14.0* 11.7*  NEUTROABS 11.8*  --   HGB 11.4* 10.4*  HCT 35.6* 30.6*  MCV 84.2 82.3  PLT 138* 107*     Cardiac Enzymes: No results for input(s): CKTOTAL, CKMB, CKMBINDEX, TROPONINI in the  last 168 hours.  BNP: Invalid input(s): POCBNP  CBG: Recent Labs  Lab 03/31/21 0809 03/31/21 1209 03/31/21 1611 03/31/21 2027 04/01/21 0800  GLUCAP 125* 116* 86 125* 128*     Microbiology: Results for orders placed or performed during the hospital encounter of 03/29/21  Urine culture     Status: Abnormal   Collection Time: 03/29/21 12:20 AM   Specimen: In/Out Cath Urine  Result Value Ref Range Status   Specimen Description   Final    IN/OUT CATH URINE Performed at [facility], [address], [place], [place] [address]    Special Requests   Final    NONE Performed at [facility], [address] [place],  [address]    Culture (A)  Final    >=100,000 COLONIES/mL ESCHERICHIA COLI Confirmed Extended Spectrum Beta-Lactamase Producer (ESBL).  In bloodstream infections from ESBL organisms, carbapenems are preferred over piperacillin/tazobactam. They are shown to have a lower risk of mortality.    Report Status 03/31/2021 FINAL  Final   Organism ID, Bacteria ESCHERICHIA COLI (A)  Final      Susceptibility   Escherichia coli - MIC*    AMPICILLIN >=32 RESISTANT Resistant     CEFAZOLIN >=64 RESISTANT Resistant  CEFEPIME 16 RESISTANT Resistant     CEFTRIAXONE >=64 RESISTANT Resistant     CIPROFLOXACIN >=4 RESISTANT Resistant     GENTAMICIN 2 SENSITIVE Sensitive     IMIPENEM <=0.25 SENSITIVE Sensitive     NITROFURANTOIN <=16 SENSITIVE Sensitive     TRIMETH/SULFA <=20 SENSITIVE Sensitive     AMPICILLIN/SULBACTAM >=32 RESISTANT Resistant     PIP/TAZO 8 SENSITIVE Sensitive     * >=100,000 COLONIES/mL ESCHERICHIA COLI  Resp Panel by RT-PCR (Flu A&B, Covid) Nasopharyngeal Swab     Status: None   Collection Time: 03/29/21 12:25 AM   Specimen: Nasopharyngeal Swab; Nasopharyngeal(NP) swabs in vial transport medium  Result Value Ref Range Status   SARS Coronavirus 2 by RT PCR NEGATIVE NEGATIVE Final    Comment: (NOTE) SARS-CoV-2 target nucleic acids  are NOT DETECTED.  The SARS-CoV-2 RNA is generally detectable in upper respiratory specimens during the acute phase of infection. The lowest concentration of SARS-CoV-2 viral copies this assay can detect is 138 copies/mL. A negative result does not preclude SARS-Cov-2 infection and should not be used as the sole basis for treatment or other patient management decisions. A negative result may occur with  improper specimen collection/handling, submission of specimen other than nasopharyngeal swab, presence of viral mutation(s) within the areas targeted by this assay, and inadequate number of viral copies(<138 copies/mL). A negative result must be combined with clinical observations, patient history, and epidemiological information. The expected result is Negative.  Fact Sheet for Patients:  EntrepreneurPulse.com.au  Fact Sheet for Healthcare Providers:  IncredibleEmployment.be  This test is no t yet approved or cleared by the [place] FDA and  has been authorized for detection and/or diagnosis of SARS-CoV-2 by FDA under an Emergency Use Authorization (EUA). This EUA will remain  in effect (meaning this test can be used) for the duration of the COVID-19 declaration under Section 564(b)(1) of the Act, 21 U.S.C.section 360bbb-3(b)(1), unless the authorization is terminated  or revoked sooner.       Influenza A by PCR NEGATIVE NEGATIVE Final   Influenza B by PCR NEGATIVE NEGATIVE Final    Comment: (NOTE) The Xpert Xpress SARS-CoV-2/FLU/RSV plus assay is intended as an aid in the diagnosis of influenza from Nasopharyngeal swab specimens and should not be used as a sole basis for treatment. Nasal washings and aspirates are unacceptable for Xpert Xpress SARS-CoV-2/FLU/RSV testing.  Fact Sheet for Patients: EntrepreneurPulse.com.au  Fact Sheet for Healthcare Providers: IncredibleEmployment.be  This test is  not yet approved or cleared by the [place] FDA and has been authorized for detection and/or diagnosis of SARS-CoV-2 by FDA under an Emergency Use Authorization (EUA). This EUA will remain in effect (meaning this test can be used) for the duration of the COVID-19 declaration under Section 564(b)(1) of the Act, 21 U.S.C. section 360bbb-3(b)(1), unless the authorization is terminated or revoked.  Performed at [facility], [address] [place], [place] [address]   Blood Culture (routine x 2)     Status: None (Preliminary result)   Collection Time: 03/29/21 12:28 AM   Specimen: BLOOD  Result Value Ref Range Status   Specimen Description BLOOD LEFT FOREARM  Final   Special Requests   Final    BOTTLES DRAWN AEROBIC AND ANAEROBIC Blood Culture adequate volume   Culture   Final    NO GROWTH 3 DAYS Performed at [facility], [address], [place], [place] [address]    Report Status PENDING  Incomplete  Blood Culture (routine x 2)  Status: None (Preliminary result)   Collection Time: 03/29/21 12:28 AM   Specimen: BLOOD  Result Value Ref Range Status   Specimen Description BLOOD LEFT THUMB  Final   Special Requests   Final    BOTTLES DRAWN AEROBIC AND ANAEROBIC Blood Culture results may not be optimal due to an inadequate volume of blood received in culture bottles   Culture   Final    NO GROWTH 3 DAYS Performed at [facility], [name], [place], [place] [address]    Report Status PENDING  Incomplete  MRSA Next Gen by PCR, Nasal     Status: None   Collection Time: 03/29/21  7:05 PM   Specimen: Nasal Mucosa; Nasal Swab  Result Value Ref Range Status   MRSA by PCR Next Gen NOT DETECTED NOT DETECTED Final    Comment: (NOTE) The GeneXpert MRSA Assay (FDA approved for NASAL specimens only), is one component of a comprehensive MRSA colonization surveillance program. It is not intended to diagnose MRSA infection nor to guide or monitor  treatment for MRSA infections. Test performance is not FDA approved in patients less than 45 years old. Performed at [facility], [address] [place], [place] [address]     Coagulation Studies: No results for input(s): LABPROT, INR in the last 72 hours.   Urinalysis: No results for input(s): COLORURINE, LABSPEC, PHURINE, GLUCOSEU, HGBUR, BILIRUBINUR, KETONESUR, PROTEINUR, UROBILINOGEN, NITRITE, LEUKOCYTESUR in the last 72 hours.  Invalid input(s): APPERANCEUR     Imaging: DG Shoulder Right  Result Date: 03/30/2021 CLINICAL DATA:  Right shoulder pain EXAM: RIGHT SHOULDER - 2+ VIEW COMPARISON:  None. FINDINGS: No acute fracture or dislocation is noted. No soft tissue abnormality is seen. Dialysis catheter is noted from the left as well as stenting in the right arm and left innominate vein. Mild degenerative changes of the glenohumeral joint and acromioclavicular joint are seen. Bony thorax appears within normal limits. IMPRESSION: No acute abnormality noted.  Degenerative changes are seen. Electronically Signed   By: Inez Catalina M.D.   On: 03/30/2021 19:26   US Venous Img Upper Uni Right(DVT)  Result Date: 03/31/2021 CLINICAL DATA:  Right upper extremity over 1 year EXAM: RIGHT UPPER EXTREMITY VENOUS DOPPLER ULTRASOUND TECHNIQUE: Gray-scale sonography with graded compression, as well as color Doppler and duplex ultrasound were performed to evaluate the upper extremity deep venous system from the level of the subclavian vein and including the jugular, axillary, basilic, radial, ulnar and upper cephalic vein. Spectral Doppler was utilized to evaluate flow at rest and with distal augmentation maneuvers. COMPARISON:  08/11/2020 FINDINGS: Contralateral Subclavian Vein: Respiratory phasicity is normal and symmetric with the symptomatic side. No evidence of thrombus. Normal compressibility. Internal Jugular Vein: No evidence of thrombus. Normal compressibility, respiratory phasicity  and response to augmentation. Subclavian Vein: No evidence of thrombus. Normal compressibility, respiratory phasicity and response to augmentation. Axillary Vein: No evidence of thrombus. Normal compressibility, respiratory phasicity and response to augmentation. Cephalic Vein: No evidence of thrombus. Normal compressibility, respiratory phasicity and response to augmentation. Basilic Vein: No evidence of thrombus. Normal compressibility, respiratory phasicity and response to augmentation. Brachial Veins: No evidence of thrombus. Normal compressibility, respiratory phasicity and response to augmentation. Radial Veins: No evidence of thrombus. Normal compressibility, respiratory phasicity and response to augmentation. Ulnar Veins: No evidence of thrombus. Normal compressibility, respiratory phasicity and response to augmentation. Other Findings: 2.0 x 1.4 x 1.1 cm ovoid hypoechoic structure in the right supraclavicular region is suspicious for abnormally enlarged lymph  node. Thrombosed right upper extremity graft is noted. In the right medial upper arm there is a 5.1 x 2.1 x 2.1 cm ovoid hypoechoic mass. This may be related to a thrombosed pseudoaneurysm. IMPRESSION: 1. No right upper extremity DVT. 2. Abnormally enlarged lymph node seen in the right supraclavicular region measuring 2.0 x 1.4 x 1.1 cm should be further evaluated with contrast enhanced soft tissue neck CT. 3. 5.1 x 2.1 x 2.1 cm ovoid hypoechoic mass in the right upper arm is most likely a thrombosed pseudoaneurysm related to occluded dialysis graft. Recommend confirmation with contrast enhanced MRI or CT of the right upper arm. Electronically Signed   By: Miachel Roux M.D.   On: 03/31/2021 13:02     Medications:    sodium chloride     sodium chloride     piperacillin-tazobactam (ZOSYN)  IV 2.25 g (04/01/21 0504)    aspirin EC  81 mg Oral Daily   Chlorhexidine Gluconate Cloth  6 each Topical Q0600   heparin  5,000 Units Subcutaneous Q8H    insulin aspart  0-5 Units Subcutaneous QHS   insulin aspart  0-9 Units Subcutaneous TID WC   lidocaine  1 patch Transdermal Q24H   lipase/protease/amylase  12,000 Units Oral TID AC   multivitamin  1 tablet Oral QHS   pantoprazole  40 mg Oral Daily   sevelamer carbonate  800 mg Oral TID WC & HS   sodium chloride, sodium chloride, acetaminophen **OR** acetaminophen, alteplase, heparin, HYDROcodone-acetaminophen, hydrOXYzine, lidocaine (PF), lidocaine-prilocaine, pentafluoroprop-tetrafluoroeth  Assessment/ Plan:  [name] is a 60 y.o.  female with a past medical history of diabetes, dCHF, CAD, PVD, and ESRD on dialysis. She presents to the ED with complaints of confusion and poor intake.   [facility] MWF Lt Permcath 75.5 kg  End Stage Renal Disease on dialysis: hemodialysis today after CT with contrast. Next treatment scheduled for Wednesday  2. Anemia of chronic kidney disease  Lab Results  Component Value Date   HGB 10.4 (L) 03/31/2021  Hgb at goal EPO outpatient   3. Secondary Hyperparathyroidism:   Lab Results  Component Value Date   PTH 357 (H) 10/05/2018   CALCIUM 7.6 (L) 04/01/2021   CAION 1.09 (L) 04/13/2020   PHOS 1.9 (L) 04/01/2021    Phosphorus decreased   Continue to hold calcium carbonate and sevelamer  4.Diabetes mellitus type II with chronic kidney disease Non-insulin dependent.  Most recent hemoglobin A1c is 6.2 on 03/29/21.     LOS: 3   7/11/20229:18 AM

## 2021-04-01 NOTE — Progress Notes (Signed)
PROGRESS NOTE    [name]  DXI:[id number] DOB: [birth year] DOA: 03/29/2021 PCP: Denton Lank, MD    Brief Narrative:  [name] is a 60 y.o. female with medical history significant for ESRD on HD TTS, DM, chronic diastolic heart failure, CAD, PVD, who was brought in from home with a complaint of confusion, decreased oral intake and not acting herself.  Most of the history is taken from ER notes this patient is very lethargic and unable to contribute to history.  She has had chills but no fever but otherwise has had no cough or complaints of chest pain or shortness of breath, vomiting or diarrhea.  At baseline she is able to ambulate with walker and is usually alert and oriented. Chest x-ray with cardiomegaly and pulmonary vascular congestion without frank interstitial edema, mild bibasilar opacities likely atelectasis CT head with no acute intracranial findings  7/9-patient had hemodialysis yesterday.  Her mentation is at baseline.  Answers appropriately. 7/10-c/o RUE pain/swelling. Keeping it elevated.  Consultants:  Nephrology  Procedures: Hemodialysis  Antimicrobials:  Ceftriaxone   Subjective: No cp, or sob  Objective: Vitals:   04/01/21 1415 04/01/21 1430 04/01/21 1445 04/01/21 1500  BP: (!) 146/63 (!) 152/65 (!) 151/60 (!) 157/62  Pulse:      Resp: 12 10 17 12   Temp:      TempSrc:      SpO2:      Weight:      Height:        Intake/Output Summary (Last 24 hours) at 04/01/2021 1507 Last data filed at 04/01/2021 0600 Gross per 24 hour  Intake 100 ml  Output --  Net 100 ml   Filed Weights   03/29/21 0037  Weight: 79.3 kg    Examination: Nad, calm Cta no w/r/r Regular s1/s2 no gallop Soft benign +bs RUE edema decreased, pain to touch/movement aaxox4    Data Reviewed: I have personally reviewed following labs and imaging studies  CBC: Recent Labs  Lab 03/29/21 0025 03/31/21 1440 04/01/21 1345  WBC 14.0* 11.7* 8.0  NEUTROABS 11.8*  --   --   HGB  11.4* 10.4* 9.7*  HCT 35.6* 30.6* 28.6*  MCV 84.2 82.3 81.9  PLT 138* 107* 767*   Basic Metabolic Panel: Recent Labs  Lab 03/29/21 0025 03/30/21 0906 03/31/21 1440 04/01/21 0829  NA 128* 130* 128* 129*  K 4.0 3.7 4.0 4.0  CL 92* 96* 92* 94*  CO2 28 27 26 26   GLUCOSE 288* 67* 99 135*  BUN 33* 27* 37* 43*  CREATININE 4.21* 3.60* 4.37* 4.95*  CALCIUM 8.6* 7.5* 7.7* 7.6*  PHOS  --   --   --  1.9*   GFR: Estimated Creatinine Clearance: 12.4 mL/min (A) (by C-G formula based on SCr of 4.95 mg/dL (H)). Liver Function Tests: Recent Labs  Lab 03/29/21 0025 04/01/21 0829  AST 19  --   ALT 12  --   ALKPHOS 104  --   BILITOT 0.8  --   PROT 6.7  --   ALBUMIN 2.7* 2.0*   No results for input(s): LIPASE, AMYLASE in the last 168 hours. No results for input(s): AMMONIA in the last 168 hours. Coagulation Profile: Recent Labs  Lab 03/29/21 0021  INR 1.3*   Cardiac Enzymes: No results for input(s): CKTOTAL, CKMB, CKMBINDEX, TROPONINI in the last 168 hours. BNP (last 3 results) No results for input(s): PROBNP in the last 8760 hours. HbA1C: No results for input(s): HGBA1C in the last 72  hours.  CBG: Recent Labs  Lab 03/31/21 0809 03/31/21 1209 03/31/21 1611 03/31/21 2027 04/01/21 0800  GLUCAP 125* 116* 86 125* 128*   Lipid Profile: No results for input(s): CHOL, HDL, LDLCALC, TRIG, CHOLHDL, LDLDIRECT in the last 72 hours. Thyroid Function Tests: No results for input(s): TSH, T4TOTAL, FREET4, T3FREE, THYROIDAB in the last 72 hours. Anemia Panel: No results for input(s): VITAMINB12, FOLATE, FERRITIN, TIBC, IRON, RETICCTPCT in the last 72 hours. Sepsis Labs: Recent Labs  Lab 03/29/21 0025  PROCALCITON 1.86  LATICACIDVEN 1.7    Recent Results (from the past 240 hour(s))  Urine culture     Status: Abnormal   Collection Time: 03/29/21 12:20 AM   Specimen: In/Out Cath Urine  Result Value Ref Range Status   Specimen Description   Final    IN/OUT CATH URINE Performed  at [facility], [address], [place], [place] [address]    Special Requests   Final    NONE Performed at [facility], [address] [place], [place] [address]    Culture (A)  Final    >=100,000 COLONIES/mL ESCHERICHIA COLI Confirmed Extended Spectrum Beta-Lactamase Producer (ESBL).  In bloodstream infections from ESBL organisms, carbapenems are preferred over piperacillin/tazobactam. They are shown to have a lower risk of mortality.    Report Status 03/31/2021 FINAL  Final   Organism ID, Bacteria ESCHERICHIA COLI (A)  Final      Susceptibility   Escherichia coli - MIC*    AMPICILLIN >=32 RESISTANT Resistant     CEFAZOLIN >=64 RESISTANT Resistant     CEFEPIME 16 RESISTANT Resistant     CEFTRIAXONE >=64 RESISTANT Resistant     CIPROFLOXACIN >=4 RESISTANT Resistant     GENTAMICIN 2 SENSITIVE Sensitive     IMIPENEM <=0.25 SENSITIVE Sensitive     NITROFURANTOIN <=16 SENSITIVE Sensitive     TRIMETH/SULFA <=20 SENSITIVE Sensitive     AMPICILLIN/SULBACTAM >=32 RESISTANT Resistant     PIP/TAZO 8 SENSITIVE Sensitive     * >=100,000 COLONIES/mL ESCHERICHIA COLI  Resp Panel by RT-PCR (Flu A&B, Covid) Nasopharyngeal Swab     Status: None   Collection Time: 03/29/21 12:25 AM   Specimen: Nasopharyngeal Swab; Nasopharyngeal(NP) swabs in vial transport medium  Result Value Ref Range Status   SARS Coronavirus 2 by RT PCR NEGATIVE NEGATIVE Final    Comment: (NOTE) SARS-CoV-2 target nucleic acids are NOT DETECTED.  The SARS-CoV-2 RNA is generally detectable in upper respiratory specimens during the acute phase of infection. The lowest concentration of SARS-CoV-2 viral copies this assay can detect is 138 copies/mL. A negative result does not preclude SARS-Cov-2 infection and should not be used as the sole basis for treatment or other patient management decisions. A negative result may occur with  improper specimen collection/handling, submission of specimen other than  nasopharyngeal swab, presence of viral mutation(s) within the areas targeted by this assay, and inadequate number of viral copies(<138 copies/mL). A negative result must be combined with clinical observations, patient history, and epidemiological information. The expected result is Negative.  Fact Sheet for Patients:  EntrepreneurPulse.com.au  Fact Sheet for Healthcare Providers:  IncredibleEmployment.be  This test is no t yet approved or cleared by the [place] FDA and  has been authorized for detection and/or diagnosis of SARS-CoV-2 by FDA under an Emergency Use Authorization (EUA). This EUA will remain  in effect (meaning this test can be used) for the duration of the COVID-19 declaration under Section 564(b)(1) of the Act, 21 U.S.C.section 360bbb-3(b)(1), unless  the authorization is terminated  or revoked sooner.       Influenza A by PCR NEGATIVE NEGATIVE Final   Influenza B by PCR NEGATIVE NEGATIVE Final    Comment: (NOTE) The Xpert Xpress SARS-CoV-2/FLU/RSV plus assay is intended as an aid in the diagnosis of influenza from Nasopharyngeal swab specimens and should not be used as a sole basis for treatment. Nasal washings and aspirates are unacceptable for Xpert Xpress SARS-CoV-2/FLU/RSV testing.  Fact Sheet for Patients: EntrepreneurPulse.com.au  Fact Sheet for Healthcare Providers: IncredibleEmployment.be  This test is not yet approved or cleared by the [place] FDA and has been authorized for detection and/or diagnosis of SARS-CoV-2 by FDA under an Emergency Use Authorization (EUA). This EUA will remain in effect (meaning this test can be used) for the duration of the COVID-19 declaration under Section 564(b)(1) of the Act, 21 U.S.C. section 360bbb-3(b)(1), unless the authorization is terminated or revoked.  Performed at [facility], [address] [place], [place]  [address]   Blood Culture (routine x 2)     Status: None (Preliminary result)   Collection Time: 03/29/21 12:28 AM   Specimen: BLOOD  Result Value Ref Range Status   Specimen Description BLOOD LEFT FOREARM  Final   Special Requests   Final    BOTTLES DRAWN AEROBIC AND ANAEROBIC Blood Culture adequate volume   Culture   Final    NO GROWTH 3 DAYS Performed at [facility], [address] [place], [place] [address]    Report Status PENDING  Incomplete  Blood Culture (routine x 2)     Status: None (Preliminary result)   Collection Time: 03/29/21 12:28 AM   Specimen: BLOOD  Result Value Ref Range Status   Specimen Description BLOOD LEFT THUMB  Final   Special Requests   Final    BOTTLES DRAWN AEROBIC AND ANAEROBIC Blood Culture results may not be optimal due to an inadequate volume of blood received in culture bottles   Culture   Final    NO GROWTH 3 DAYS Performed at [facility], [address], [place], [place] [address]    Report Status PENDING  Incomplete  MRSA Next Gen by PCR, Nasal     Status: None   Collection Time: 03/29/21  7:05 PM   Specimen: Nasal Mucosa; Nasal Swab  Result Value Ref Range Status   MRSA by PCR Next Gen NOT DETECTED NOT DETECTED Final    Comment: (NOTE) The GeneXpert MRSA Assay (FDA approved for NASAL specimens only), is one component of a comprehensive MRSA colonization surveillance program. It is not intended to diagnose MRSA infection nor to guide or monitor treatment for MRSA infections. Test performance is not FDA approved in patients less than 99 years old. Performed at [facility], [address] [place], [place] [address]          Radiology Studies: DG Shoulder Right  Result Date: 03/30/2021 CLINICAL DATA:  Right shoulder pain EXAM: RIGHT SHOULDER - 2+ VIEW COMPARISON:  None. FINDINGS: No acute fracture or dislocation is noted. No soft tissue abnormality is seen. Dialysis catheter is noted from the left as  well as stenting in the right arm and left innominate vein. Mild degenerative changes of the glenohumeral joint and acromioclavicular joint are seen. Bony thorax appears within normal limits. IMPRESSION: No acute abnormality noted.  Degenerative changes are seen. Electronically Signed   By: Inez Catalina M.D.   On: 03/30/2021 19:26   CT SOFT TISSUE NECK W  CONTRAST  Result Date: 04/01/2021 CLINICAL DATA:  Neck mass, abnormal ultrasound EXAM: CT NECK WITH CONTRAST TECHNIQUE: Multidetector CT imaging of the neck was performed using the standard protocol following the bolus administration of intravenous contrast. CONTRAST:  31mL OMNIPAQUE IOHEXOL 300 MG/ML  SOLN COMPARISON:  Correlation made with upper extremity ultrasound 03/31/2021 FINDINGS: Pharynx and larynx: Unremarkable.  No mass or swelling. Salivary glands: Unremarkable. Thyroid: Unremarkable. Lymph nodes: Enlarged right supraclavicular node measuring 2.1 cm (series 2, image 73). No additional enlarged nodes identified. Vascular: There is narrowing of the central right internal jugular vein. No flow within the central left internal jugular vein. Mixed plaque at the right ICA origin. Left subclavian and brachiocephalic vein stenting. No apparent subclavian flow bilaterally. Given patency on recent ultrasound, presumably due to phase of study. Extensive venous collateral formation. Left IJ approach catheter partially imaged. Calcified plaque at the left ICA origin. Likely similar to prior CTA. Limited intracranial: No abnormal enhancement. Visualized orbits: No significant abnormality. Mastoids and visualized paranasal sinuses: Left mastoid and middle ear effusions. No significant paranasal sinus opacification. Skeleton: No acute abnormality. Upper chest: Mild interlobular septal thickening may reflect edema. Small left pleural effusion with areas of loculation. Chest wall edema. Other: Subcutaneous edema. IMPRESSION: 2 cm enlarged right supraclavicular node.  No additional enlarged nodes are identified. Subcutaneous edema, mild interstitial pulmonary edema, and partially loculated small left pleural effusion suggesting volume overload. Venous collateral formation is noted. There is no apparent flow in the subclavian or central internal jugular veins. May reflect timing of study and reduced flow, noting reported patency on ultrasound. Electronically Signed   By: Macy Mis M.D.   On: 04/01/2021 11:06   US Venous Img Upper Uni Right(DVT)  Result Date: 03/31/2021 CLINICAL DATA:  Right upper extremity over 1 year EXAM: RIGHT UPPER EXTREMITY VENOUS DOPPLER ULTRASOUND TECHNIQUE: Gray-scale sonography with graded compression, as well as color Doppler and duplex ultrasound were performed to evaluate the upper extremity deep venous system from the level of the subclavian vein and including the jugular, axillary, basilic, radial, ulnar and upper cephalic vein. Spectral Doppler was utilized to evaluate flow at rest and with distal augmentation maneuvers. COMPARISON:  08/11/2020 FINDINGS: Contralateral Subclavian Vein: Respiratory phasicity is normal and symmetric with the symptomatic side. No evidence of thrombus. Normal compressibility. Internal Jugular Vein: No evidence of thrombus. Normal compressibility, respiratory phasicity and response to augmentation. Subclavian Vein: No evidence of thrombus. Normal compressibility, respiratory phasicity and response to augmentation. Axillary Vein: No evidence of thrombus. Normal compressibility, respiratory phasicity and response to augmentation. Cephalic Vein: No evidence of thrombus. Normal compressibility, respiratory phasicity and response to augmentation. Basilic Vein: No evidence of thrombus. Normal compressibility, respiratory phasicity and response to augmentation. Brachial Veins: No evidence of thrombus. Normal compressibility, respiratory phasicity and response to augmentation. Radial Veins: No evidence of thrombus.  Normal compressibility, respiratory phasicity and response to augmentation. Ulnar Veins: No evidence of thrombus. Normal compressibility, respiratory phasicity and response to augmentation. Other Findings: 2.0 x 1.4 x 1.1 cm ovoid hypoechoic structure in the right supraclavicular region is suspicious for abnormally enlarged lymph node. Thrombosed right upper extremity graft is noted. In the right medial upper arm there is a 5.1 x 2.1 x 2.1 cm ovoid hypoechoic mass. This may be related to a thrombosed pseudoaneurysm. IMPRESSION: 1. No right upper extremity DVT. 2. Abnormally enlarged lymph node seen in the right supraclavicular region measuring 2.0 x 1.4 x 1.1 cm should be further evaluated with contrast enhanced soft tissue neck  CT. 3. 5.1 x 2.1 x 2.1 cm ovoid hypoechoic mass in the right upper arm is most likely a thrombosed pseudoaneurysm related to occluded dialysis graft. Recommend confirmation with contrast enhanced MRI or CT of the right upper arm. Electronically Signed   By: Miachel Roux M.D.   On: 03/31/2021 13:02        Scheduled Meds:  aspirin EC  81 mg Oral Daily   Chlorhexidine Gluconate Cloth  6 each Topical Q0600   heparin  5,000 Units Subcutaneous Q8H   insulin aspart  0-5 Units Subcutaneous QHS   insulin aspart  0-9 Units Subcutaneous TID WC   lidocaine  1 patch Transdermal Q24H   lipase/protease/amylase  12,000 Units Oral TID AC   multivitamin  1 tablet Oral QHS   pantoprazole  40 mg Oral Daily   sevelamer carbonate  800 mg Oral TID WC & HS   Continuous Infusions:  sodium chloride     sodium chloride     piperacillin-tazobactam (ZOSYN)  IV 2.25 g (04/01/21 0504)    Assessment & Plan:   Principal Problem:   Acute metabolic encephalopathy Active Problems:   End-stage renal disease on hemodialysis ([place])   Diabetes mellitus, type II (HCC)   HTN (hypertension)   Coronary artery disease involving native coronary artery of native heart without angina pectoris   UTI (urinary  tract infection)   60 year old female with history of ESRD on HD TTS, DM, chronic diastolic heart failure, CAD, PVD, presenting with confusion, decreased oral intake and not acting herself.       UTI with possible sepsis Acute metabolic encephalopathy -Patient with fever, leukocytosis of 14,000 and pyuria on urinalysis, presenting with altered mental status MS improved at baseline. Urine culture with more than 100,000 E. coli, awaiting sensitivities 7/10-spoke to pharmacy will start on Zosyn Blood cultures pending     End-stage renal disease on hemodialysis [facility]) Nephrology following Refused protein drinks Hemodialysis tomorrow  RUE pain/swelling Xr negative for fx Will obtain venous US    Chronic diastolic heart failure Without exacerbation Continue to monitor, I's and O's On dialysis      Diabetes mellitus, type II (HCC) BG overall stable Encourage p.o. intake A1c 6.2       HTN (hypertension) BP stable Need to monitor     Coronary artery disease involving native coronary artery of native heart without angina pectoris - Had no complaints of chest pain, troponin mildly elevated at 30 and EKG nonacute Mild troponin elevation likely due to demand ischemia.  Trended down.   Consult PT OT  DVT prophylaxis: Heparin Code Status: Full Family Communication: None at bedside Disposition Plan:  Status is: Inpatient  Remains inpatient appropriate because:Inpatient level of care appropriate due to severity of illness  Dispo: The patient is from: Home              Anticipated d/c is to: Home              Patient currently is not medically stable to d/c.   Difficult to place patient Yes            LOS: 3 days   Time spent: 35 minutes with more than 50% on [place], MD [facility] Pager [phone]  If 7PM-7AM, please contact night-coverage 04/01/2021, 3:07 PM

## 2021-04-01 NOTE — Plan of Care (Signed)
End of Shift Summary:  Alert and oriented x4. VSS on room air. Pain managed with prn medications. Denies n/v. Dangled on side of bed with 2 assists. (+) BM x 2 occurrences. CHG bath done. Remained free from falls or injury. Call bell within reach and able to use.   Problem: Clinical Measurements: Goal: Ability to maintain clinical measurements within normal limits will improve Outcome: Progressing Goal: Will remain free from infection Outcome: Progressing Goal: Diagnostic test results will improve Outcome: Progressing Goal: Respiratory complications will improve Outcome: Progressing Goal: Cardiovascular complication will be avoided Outcome: Progressing   Problem: Elimination: Goal: Will not experience complications related to bowel motility Outcome: Progressing Goal: Will not experience complications related to urinary retention Outcome: Progressing   Problem: Pain Managment: Goal: General experience of comfort will improve Outcome: Progressing   Problem: Safety: Goal: Ability to remain free from injury will improve Outcome: Progressing   Problem: Skin Integrity: Goal: Risk for impaired skin integrity will decrease Outcome: Progressing

## 2021-04-02 ENCOUNTER — Telehealth (INDEPENDENT_AMBULATORY_CARE_PROVIDER_SITE_OTHER): Payer: Self-pay | Admitting: Vascular Surgery

## 2021-04-02 DIAGNOSIS — N186 End stage renal disease: Secondary | ICD-10-CM

## 2021-04-02 DIAGNOSIS — R4182 Altered mental status, unspecified: Secondary | ICD-10-CM

## 2021-04-02 DIAGNOSIS — A419 Sepsis, unspecified organism: Secondary | ICD-10-CM

## 2021-04-02 LAB — GLUCOSE, CAPILLARY
Glucose-Capillary: 212 mg/dL — ABNORMAL HIGH (ref 70–99)
Glucose-Capillary: 141 mg/dL — ABNORMAL HIGH (ref 70–99)
Glucose-Capillary: 106 mg/dL — ABNORMAL HIGH (ref 70–99)

## 2021-04-02 MED ORDER — POLYETHYLENE GLYCOL 3350 17 G PO PACK: 17.0000 g | PACK | Freq: Every day | ORAL | Status: DC | PRN

## 2021-04-02 MED ORDER — WITCH HAZEL-GLYCERIN EX PADS
MEDICATED_PAD | CUTANEOUS | Status: DC | PRN
  Administered 2021-04-02: 1 via TOPICAL
  Filled 2021-04-02: qty 100

## 2021-04-02 MED ORDER — SULFAMETHOXAZOLE-TRIMETHOPRIM 800-160 MG PO TABS: 1.0000 | ORAL_TABLET | Freq: Every day | ORAL | 0 refills | Status: AC

## 2021-04-02 NOTE — Discharge Summary (Signed)
[name]:[id number] DOB: 1961-04-07 DOA: 03/29/2021  PCP: Robin Lank, MD  Admit date: 03/29/2021 Discharge date: 04/02/2021  Admitted From: Home Disposition: Home  Recommendations for Outpatient Follow-up:  Follow up with PCP in 1 week Please obtain BMP/CBC in one week Follow-up with Dr. Delana Meyer as previously scheduled on July 18 Follow-up with dialysis.  Dr. Juleen China will notify her center  Home Health: Yes   Discharge Condition:Stable CODE STATUS: Full Diet recommendation: Renal diet    Brief/Interim Summary: Per ENI:DPOEU [name] is a 60 y.o. female with medical history significant for ESRD on HD TTS, DM, chronic diastolic heart failure, CAD, PVD, who was brought in from home with a complaint of confusion, decreased oral intake and not acting herself.  Most of the history is taken from ER notes this patient is very lethargic and unable to contribute to history.  She has had chills but no fever but otherwise has had no cough or complaints of chest pain or shortness of breath, vomiting or diarrhea.  At baseline she is able to ambulate with walker and is usually alert and oriented. ED course: On arrival, temperature 103.2, BP 143/49, pulse 88 with O2 sat 93% on room air.  Blood work significant for WBC 14,000 with hemoglobin 11.4.  Lactic acid 1.7, sodium 128.  Troponin 30 with BNP 1338.  Urinalysis with pyuria.CT head with no acute intracranial findings. She was admitted to the hospital.  She continued getting dialysis.  She was treated with IV antibiotics for UTI.  She complained of right upper extremity pain.  It was swollen.  Vascular surgery was consulted with CT scan and ultrasound findings.  They thought patient can follow-up as outpatient with Dr. Delana Meyer as previously scheduled for further management.  Otherwise patient is stable to be discharged and has been cleared by nephrology and vascular surgery.  PT OT saw patient and recommended SNF but patient refused.   UTI  Acute  metabolic encephalopathy-due to infection Sepsis ruled out MS improved to baseline Urine culture with more than 100,000 E. Coli. Spoke to pharmacy as urine culture showed many resistance to antibiotics.  They recommended Bactrim for couple more days to complete treatment.   She was on IV Zosyn here.  Blood cultures were negative.       End-stage renal disease on hemodialysis ([place]) Received hemodialysis and nephrology was following here     RUE pain/swelling Xr negative for fx Venous US found with changes likely a thrombosed pseudoaneurys related to occluded HD graft. Also found with ovid structure in Rt supraclv region suspicious for abn LN, recommended ct neck with contrast. Obtained CT neck this am, results reported -there is no apparent flow in the subclavian or central internal jugular veins. May reflect timing of study and reduced flow, noting reported patency on ultrasound. Vascular surgery consulted., they felt she can be discharged home and follow-up with Dr. Delana Meyer as previously planned for further management. Please see full report.       Chronic diastolic heart failure Without acute exacerbation On dialysis          Diabetes mellitus, type II ([place]) Continue home meds   Thrombocytopenia- appears to be chronic. Levels varies. Follow-up with PCP as outpatient     HTN (hypertension) Continue home meds     Coronary artery disease involving native coronary artery of native heart without angina pectoris - Had no complaints of chest pain, troponin mildly elevated at 30 and EKG nonacute Mild troponin elevation likely due to  demand ischemia.  Trended down.      Discharge Diagnoses:  Principal Problem:   Acute metabolic encephalopathy Active Problems:   End-stage renal disease on hemodialysis (HCC)   Diabetes mellitus, type II (HCC)   HTN (hypertension)   Coronary artery disease involving native coronary artery of native heart without angina pectoris   UTI  (urinary tract infection)    Discharge Instructions  Discharge Instructions     Call MD for:  temperature >100.4   Complete by: As directed    Diet - low sodium heart healthy   Complete by: As directed    Discharge wound care:   Complete by: As directed    Keep arm elevated   Increase activity slowly   Complete by: As directed       Allergies as of 04/02/2021       Reactions   Ace Inhibitors Swelling, Anaphylaxis   Ativan [lorazepam] Other (See Comments)   Reaction:  Hallucinations and headaches   Compazine [prochlorperazine Edisylate] Anaphylaxis, Nausea And Vomiting, Other (See Comments)   Other reaction(s): dystonia from this vs. Reglan, 23 Jul - patient relates that she takes promethazine frequently with no problems   Sumatriptan Succinate Other (See Comments)   Other reaction(s): delirium and hallucinations per [facility] records   Zofran [ondansetron] Nausea And Vomiting   Per pt. she is allergic to zofran or will experience adverse reaction like hallucination    Losartan Nausea Only   Prochlorperazine Other (See Comments)   Reaction:  Unknown . Patient does not remember reaction but she does have vertigo and anxiety along with n and v at times. Could be used to treat any of these   Reglan [metoclopramide] Other (See Comments)   Per patient her Dr. Evelina Bucy her off it    Scopolamine Other (See Comments)   Dizziness, also has vertigo already   Tape Rash   Plastic tape causes rash   Tapentadol Rash   Ultrasound Gel Itching   Patient states the Ultrasound gel caused itching while in skin contact and resolved when wiped away.        Medication List     TAKE these medications    albuterol (2.5 MG/3ML) 0.083% nebulizer solution Commonly known as: PROVENTIL Take 2.5 mg by nebulization every 4 (four) hours as needed for wheezing or shortness of breath.   albuterol 108 (90 Base) MCG/ACT inhaler Commonly known as: VENTOLIN HFA Inhale 2 puffs into the lungs every 4 (four)  hours as needed for wheezing or shortness of breath.   alum & mag hydroxide-simeth 200-200-20 MG/5ML suspension Commonly known as: MAALOX/MYLANTA Take 15 mLs by mouth every 6 (six) hours as needed for indigestion or heartburn.   amLODipine 10 MG tablet Commonly known as: NORVASC Take 1 tablet (10 mg total) by mouth daily.   ammonium lactate 12 % lotion Commonly known as: LAC-HYDRIN Apply 1 application topically 2 (two) times daily as needed for dry skin.   aspirin EC 81 MG tablet Take 81 mg by mouth daily.   atorvastatin 20 MG tablet Commonly known as: LIPITOR Take 20 mg by mouth every evening.   calcium carbonate 500 MG chewable tablet Commonly known as: TUMS - dosed in mg elemental calcium Chew 2.5 tablets (500 mg of elemental calcium total) by mouth every 6 (six) hours as needed for indigestion.   cholestyramine light 4 g packet Commonly known as: PREVALITE Take 4 g by mouth 2 (two) times daily.   Creon 12000-38000 units Cpep capsule Generic drug:  lipase/protease/amylase Take 36,000 Units by mouth 3 (three) times daily before meals.   cyanocobalamin 1000 MCG tablet Take 1,000 mcg by mouth daily.   diphenhydrAMINE 25 MG tablet Commonly known as: BENADRYL Take 25 mg by mouth every 6 (six) hours as needed for itching.   epoetin alfa 10000 UNIT/ML injection Commonly known as: EPOGEN Inject 1 mL (10,000 Units total) into the vein every Monday, Wednesday, and Friday with hemodialysis.   feeding supplement (NEPRO CARB STEADY) Liqd Take 237 mLs by mouth 2 (two) times daily between meals.   furosemide 80 MG tablet Commonly known as: LASIX Take 1 tablet by mouth daily.   gabapentin 100 MG capsule Commonly known as: NEURONTIN Take 100 mg by mouth every evening.   hydrALAZINE 50 MG tablet Commonly known as: APRESOLINE Take 1 tablet (50 mg total) by mouth every 8 (eight) hours.   HYDROcodone-acetaminophen 5-325 MG tablet Commonly known as: NORCO/VICODIN Take 1  tablet by mouth 3 (three) times daily as needed for moderate pain or severe pain.   hydrOXYzine 25 MG tablet Commonly known as: ATARAX/VISTARIL Take 25 mg by mouth daily.   Iron 325 (65 Fe) MG Tabs Take 325 mg by mouth daily.   meclizine 25 MG tablet Commonly known as: ANTIVERT Take 25 mg by mouth 4 (four) times daily as needed for dizziness.   megestrol 400 MG/10ML suspension Commonly known as: MEGACE Take 10 mLs (400 mg total) by mouth daily.   multivitamin Tabs tablet Take 1 tablet by mouth daily.   mupirocin ointment 2 % Commonly known as: BACTROBAN Apply 1 application topically daily as needed (leg rash).   nitroGLYCERIN 0.4 MG SL tablet Commonly known as: NITROSTAT Place 0.4 mg under the tongue every 5 (five) minutes as needed for chest pain.   Omega-3 300 MG Caps Take 300 mg by mouth 2 (two) times daily. Every other day opposite omega XL   ondansetron 4 MG tablet Commonly known as: Zofran Take 1 tablet (4 mg total) by mouth every 8 (eight) hours as needed for nausea or vomiting.   pantoprazole 40 MG tablet Commonly known as: PROTONIX Take 40 mg by mouth daily.   polyethylene glycol 17 g packet Commonly known as: MIRALAX / GLYCOLAX Take 17 g by mouth daily as needed.   PROBIOTIC PO Take 1 capsule by mouth daily.   sevelamer carbonate 800 MG tablet Commonly known as: RENVELA Take 800 mg by mouth 4 (four) times daily. (at meals and with a snack)   sulfamethoxazole-trimethoprim 800-160 MG tablet Commonly known as: BACTRIM DS Take 1 tablet by mouth daily for 2 days.   VITA-C PO Take 1 tablet by mouth daily.   Vitamin D3 25 MCG (1000 UT) Caps Take 1,000 Units by mouth daily.   vitamin E 180 MG (400 UNITS) capsule Take 400 Units by mouth daily.               Discharge Care Instructions  (From admission, onward)           Start     Ordered   04/02/21 0000  Discharge wound care:       Comments: Keep arm elevated   04/02/21 1529             Follow-up Information     Schnier, Dolores Lory, MD. Go on 04/08/2021.   Specialties: Vascular Surgery, Cardiology, Radiology, Vascular Surgery Why: Patient already has an appointment on April 08, 2021 with Dr. Delana Meyer.  Patient has spoken to Dr. Delana Meyer as  well as called our office to confirm this date.  Patient will need a right upper extremity arterial duplex with visit. Contact information: [place] [address] [phone]         Robin Lank, MD Follow up in 1 week(s).   Specialty: Family Medicine Contact information: [address] [place] [address] [phone]                Allergies  Allergen Reactions   Ace Inhibitors Swelling and Anaphylaxis   Ativan [Lorazepam] Other (See Comments)    Reaction:  Hallucinations and headaches   Compazine [Prochlorperazine Edisylate] Anaphylaxis, Nausea And Vomiting and Other (See Comments)    Other reaction(s): dystonia from this vs. Reglan, 23 Jul - patient relates that she takes promethazine frequently with no problems   Sumatriptan Succinate Other (See Comments)    Other reaction(s): delirium and hallucinations per [facility] records   Zofran [Ondansetron] Nausea And Vomiting    Per pt. she is allergic to zofran or will experience adverse reaction like hallucination    Losartan Nausea Only   Prochlorperazine Other (See Comments)    Reaction:  Unknown . Patient does not remember reaction but she does have vertigo and anxiety along with n and v at times. Could be used to treat any of these    Reglan [Metoclopramide] Other (See Comments)    Per patient her Dr. Evelina Bucy her off it    Scopolamine Other (See Comments)    Dizziness, also has vertigo already   Tape Rash    Plastic tape causes rash   Tapentadol Rash   Ultrasound Gel Itching    Patient states the Ultrasound gel caused itching while in skin contact and resolved when wiped away.    Consultations: Vascular,  nephrology   Procedures/Studies: DG Shoulder Right  Result Date: 03/30/2021 CLINICAL DATA:  Right shoulder pain EXAM: RIGHT SHOULDER - 2+ VIEW COMPARISON:  None. FINDINGS: No acute fracture or dislocation is noted. No soft tissue abnormality is seen. Dialysis catheter is noted from the left as well as stenting in the right arm and left innominate vein. Mild degenerative changes of the glenohumeral joint and acromioclavicular joint are seen. Bony thorax appears within normal limits. IMPRESSION: No acute abnormality noted.  Degenerative changes are seen. Electronically Signed   By: Inez Catalina M.D.   On: 03/30/2021 19:26   CT Head Wo Contrast  Result Date: 03/29/2021 CLINICAL DATA:  Altered mental status EXAM: CT HEAD WITHOUT CONTRAST TECHNIQUE: Contiguous axial images were obtained from the base of the skull through the vertex without intravenous contrast. COMPARISON:  09/17/2020 FINDINGS: Brain: No evidence of acute infarction, hemorrhage, hydrocephalus, extra-axial collection or mass lesion/mass effect. Vascular: No hyperdense vessel or unexpected calcification. Skull: Normal. Negative for fracture or focal lesion. Sinuses/Orbits: The visualized paranasal sinuses are essentially clear. The mastoid air cells are unopacified. Other: None. IMPRESSION: Normal head CT. Electronically Signed   By: Julian Hy M.D.   On: 03/29/2021 01:12   CT SOFT TISSUE NECK W CONTRAST  Result Date: 04/01/2021 CLINICAL DATA:  Neck mass, abnormal ultrasound EXAM: CT NECK WITH CONTRAST TECHNIQUE: Multidetector CT imaging of the neck was performed using the standard protocol following the bolus administration of intravenous contrast. CONTRAST:  33mL OMNIPAQUE IOHEXOL 300 MG/ML  SOLN COMPARISON:  Correlation made with upper extremity ultrasound 03/31/2021 FINDINGS: Pharynx and larynx: Unremarkable.  No mass or swelling. Salivary glands: Unremarkable. Thyroid: Unremarkable. Lymph nodes: Enlarged right supraclavicular node  measuring 2.1 cm (  series 2, image 73). No additional enlarged nodes identified. Vascular: There is narrowing of the central right internal jugular vein. No flow within the central left internal jugular vein. Mixed plaque at the right ICA origin. Left subclavian and brachiocephalic vein stenting. No apparent subclavian flow bilaterally. Given patency on recent ultrasound, presumably due to phase of study. Extensive venous collateral formation. Left IJ approach catheter partially imaged. Calcified plaque at the left ICA origin. Likely similar to prior CTA. Limited intracranial: No abnormal enhancement. Visualized orbits: No significant abnormality. Mastoids and visualized paranasal sinuses: Left mastoid and middle ear effusions. No significant paranasal sinus opacification. Skeleton: No acute abnormality. Upper chest: Mild interlobular septal thickening may reflect edema. Small left pleural effusion with areas of loculation. Chest wall edema. Other: Subcutaneous edema. IMPRESSION: 2 cm enlarged right supraclavicular node. No additional enlarged nodes are identified. Subcutaneous edema, mild interstitial pulmonary edema, and partially loculated small left pleural effusion suggesting volume overload. Venous collateral formation is noted. There is no apparent flow in the subclavian or central internal jugular veins. May reflect timing of study and reduced flow, noting reported patency on ultrasound. Electronically Signed   By: Macy Mis M.D.   On: 04/01/2021 11:06   US Venous Img Lower Bilateral (DVT)  Result Date: 03/17/2021 CLINICAL DATA:  Bilateral leg pain and swelling. EXAM: BILATERAL LOWER EXTREMITY VENOUS DOPPLER ULTRASOUND TECHNIQUE: Gray-scale sonography with compression, as well as color and duplex ultrasound, were performed to evaluate the deep venous system(s) from the level of the common femoral vein through the popliteal and proximal calf veins. COMPARISON:  None. FINDINGS: VENOUS Normal  compressibility of the BILATERAL common femoral, superficial femoral, and popliteal veins, as well as the visualized BILATERAL calf veins. Visualized portions of the BILATERAL profunda femoral veins and BILATERAL great saphenous veins are unremarkable. No filling defects to suggest DVT on grayscale or color Doppler imaging. Doppler waveforms show normal direction of venous flow and normal respiratory plasticity. OTHER Marked severity diffuse bilateral lower extremity edema is seen. Limitations: It should be noted that the patient refused augmentation secondary to pain (as per the ultrasound technologist). IMPRESSION: 1. Limited study, as described above, without evidence of DVT within the RIGHT or LEFT lower extremity. 2. Diffuse bilateral lower extremity edema. Electronically Signed   By: Virgina Norfolk M.D.   On: 03/17/2021 22:55   US Venous Img Upper Uni Right(DVT)  Result Date: 03/31/2021 CLINICAL DATA:  Right upper extremity over 1 year EXAM: RIGHT UPPER EXTREMITY VENOUS DOPPLER ULTRASOUND TECHNIQUE: Gray-scale sonography with graded compression, as well as color Doppler and duplex ultrasound were performed to evaluate the upper extremity deep venous system from the level of the subclavian vein and including the jugular, axillary, basilic, radial, ulnar and upper cephalic vein. Spectral Doppler was utilized to evaluate flow at rest and with distal augmentation maneuvers. COMPARISON:  08/11/2020 FINDINGS: Contralateral Subclavian Vein: Respiratory phasicity is normal and symmetric with the symptomatic side. No evidence of thrombus. Normal compressibility. Internal Jugular Vein: No evidence of thrombus. Normal compressibility, respiratory phasicity and response to augmentation. Subclavian Vein: No evidence of thrombus. Normal compressibility, respiratory phasicity and response to augmentation. Axillary Vein: No evidence of thrombus. Normal compressibility, respiratory phasicity and response to  augmentation. Cephalic Vein: No evidence of thrombus. Normal compressibility, respiratory phasicity and response to augmentation. Basilic Vein: No evidence of thrombus. Normal compressibility, respiratory phasicity and response to augmentation. Brachial Veins: No evidence of thrombus. Normal compressibility, respiratory phasicity and response to augmentation. Radial Veins: No evidence of thrombus.  Normal compressibility, respiratory phasicity and response to augmentation. Ulnar Veins: No evidence of thrombus. Normal compressibility, respiratory phasicity and response to augmentation. Other Findings: 2.0 x 1.4 x 1.1 cm ovoid hypoechoic structure in the right supraclavicular region is suspicious for abnormally enlarged lymph node. Thrombosed right upper extremity graft is noted. In the right medial upper arm there is a 5.1 x 2.1 x 2.1 cm ovoid hypoechoic mass. This may be related to a thrombosed pseudoaneurysm. IMPRESSION: 1. No right upper extremity DVT. 2. Abnormally enlarged lymph node seen in the right supraclavicular region measuring 2.0 x 1.4 x 1.1 cm should be further evaluated with contrast enhanced soft tissue neck CT. 3. 5.1 x 2.1 x 2.1 cm ovoid hypoechoic mass in the right upper arm is most likely a thrombosed pseudoaneurysm related to occluded dialysis graft. Recommend confirmation with contrast enhanced MRI or CT of the right upper arm. Electronically Signed   By: Miachel Roux M.D.   On: 03/31/2021 13:02   DG Chest Port 1 View  Result Date: 03/29/2021 CLINICAL DATA:  Sepsis EXAM: PORTABLE CHEST 1 VIEW COMPARISON:  03/17/2021 FINDINGS: Cardiomegaly with pulmonary vascular congestion. No frank interstitial edema. Mild bibasilar opacities, likely atelectasis. Suspected small left pleural effusion. No pneumothorax. Left IJ dual lumen dialysis catheter terminating in the upper right atrium. Left subclavian stent. IMPRESSION: Cardiomegaly with pulmonary vascular congestion. No frank interstitial edema. Small  left pleural effusion. Mild bibasilar opacities, likely atelectasis. Electronically Signed   By: Julian Hy M.D.   On: 03/29/2021 00:44   DG Chest Port 1 View  Result Date: 03/17/2021 CLINICAL DATA:  Patient leaking fluid from right leg. End-stage renal disease. History of CHF. EXAM: PORTABLE CHEST 1 VIEW COMPARISON:  Feb 14, 2021 FINDINGS: The cardiomediastinal silhouette is stable. Diffuse bilateral interstitial opacities, similar in the interval. The dialysis catheter is stable terminating near the caval atrial junction and in the right atrium. No pneumothorax. No other changes. IMPRESSION: Findings are most consistent with CHF.  Stable left central line. Electronically Signed   By: Dorise Bullion III M.D   On: 03/17/2021 19:41   VAS [place] UPPER EXTREMITY ARTERIAL DUPLEX  Result Date: 03/07/2021  UPPER EXTREMITY DUPLEX STUDY Patient Name:  JILLEEN ESSNER  Date of Exam:   03/05/2021 Medical Rec #: [id number]    Accession #:    [id number] Date of Birth: 09-22-1961     Patient Gender: F Patient Age:   47Y Exam [facility] Procedure:      VAS [place] UPPER EXTREMITY ARTERIAL DUPLEX Referring Phys: [id number] [place] --------------------------------------------------------------------------------  History: Patient has a history of upper extremity pain.  Comparison Study: 06/09/2020 Performing Technologist: Charlane Ferretti RT (R)(VS)  Examination Guidelines: A complete evaluation includes B-mode imaging, spectral Doppler, color Doppler, and power Doppler as needed of all accessible portions of each vessel. Bilateral testing is considered an integral part of a complete examination. Limited examinations for reoccurring indications may be performed as noted.  Right Doppler Findings: +---------------+----------+---------+--------+--------+ Site           PSV (cm/s)Waveform StenosisComments +---------------+----------+---------+--------+--------+ Subclavian Prox140       triphasic                  +---------------+----------+---------+--------+--------+ Subclavian Dist          biphasic                  +---------------+----------+---------+--------+--------+ Axillary       103  triphasic                 +---------------+----------+---------+--------+--------+ Brachial Prox  127       triphasic                 +---------------+----------+---------+--------+--------+ Brachial Mid   133       triphasic                 +---------------+----------+---------+--------+--------+ Brachial Dist  -149      triphasic                 +---------------+----------+---------+--------+--------+ Radial Dist    152       biphasic                  +---------------+----------+---------+--------+--------+ Ulnar Dist     104       triphasic                 +---------------+----------+---------+--------+--------+ Summary:  Right: No obstruction visualized in the right upper extremity. *See table(s) above for measurements and observations. Electronically signed by Hortencia Pilar MD on 03/07/2021 at 4:41:55 PM.    Final       Subjective: Feels better today.  Reports her right upper extremity feels and looks better.  She has been elevating it and the swelling has tremendously decreased.  Discharge Exam: Vitals:   04/02/21 1127 04/02/21 1552  BP: (!) 134/53 (!) 144/55  Pulse: 72 74  Resp: 16 18  Temp: 98.3 F (36.8 C) 97.9 F (36.6 C)  SpO2: 97% 100%   Vitals:   04/02/21 0349 04/02/21 0727 04/02/21 1127 04/02/21 1552  BP: (!) 128/48 (!) 150/59 (!) 134/53 (!) 144/55  Pulse: 75 73 72 74  Resp: 16 18 16 18   Temp: 98.2 F (36.8 C) 98.5 F (36.9 C) 98.3 F (36.8 C) 97.9 F (36.6 C)  TempSrc:  Oral Oral Oral  SpO2: 97% 98% 97% 100%  Weight:      Height:        General: Pt is alert, awake, not in acute distress Cardiovascular: RRR, S1/S2 +, no rubs, no gallops Respiratory: CTA bilaterally, no wheezing, no rhonchi Abdominal: Soft, NT, ND, bowel  sounds + Extremities: Chronic skin changes, +LE edema. RUE swelling down    The results of significant diagnostics from this hospitalization (including imaging, microbiology, ancillary and laboratory) are listed below for reference.     Microbiology: Recent Results (from the past 240 hour(s))  Urine culture     Status: Abnormal   Collection Time: 03/29/21 12:20 AM   Specimen: In/Out Cath Urine  Result Value Ref Range Status   Specimen Description   Final    IN/OUT CATH URINE Performed at [facility], [address], [place], [place] [address]    Special Requests   Final    NONE Performed at [facility], [address] [place], [place] [address]    Culture (A)  Final    >=100,000 COLONIES/mL ESCHERICHIA COLI Confirmed Extended Spectrum Beta-Lactamase Producer (ESBL).  In bloodstream infections from ESBL organisms, carbapenems are preferred over piperacillin/tazobactam. They are shown to have a lower risk of mortality.    Report Status 03/31/2021 FINAL  Final   Organism ID, Bacteria ESCHERICHIA COLI (A)  Final      Susceptibility   Escherichia coli - MIC*    AMPICILLIN >=32 RESISTANT Resistant     CEFAZOLIN >=64 RESISTANT Resistant     CEFEPIME 16 RESISTANT Resistant     CEFTRIAXONE >=  64 RESISTANT Resistant     CIPROFLOXACIN >=4 RESISTANT Resistant     GENTAMICIN 2 SENSITIVE Sensitive     IMIPENEM <=0.25 SENSITIVE Sensitive     NITROFURANTOIN <=16 SENSITIVE Sensitive     TRIMETH/SULFA <=20 SENSITIVE Sensitive     AMPICILLIN/SULBACTAM >=32 RESISTANT Resistant     PIP/TAZO 8 SENSITIVE Sensitive     * >=100,000 COLONIES/mL ESCHERICHIA COLI  Resp Panel by RT-PCR (Flu A&B, Covid) Nasopharyngeal Swab     Status: None   Collection Time: 03/29/21 12:25 AM   Specimen: Nasopharyngeal Swab; Nasopharyngeal(NP) swabs in vial transport medium  Result Value Ref Range Status   SARS Coronavirus 2 by RT PCR NEGATIVE NEGATIVE Final    Comment: (NOTE) SARS-CoV-2  target nucleic acids are NOT DETECTED.  The SARS-CoV-2 RNA is generally detectable in upper respiratory specimens during the acute phase of infection. The lowest concentration of SARS-CoV-2 viral copies this assay can detect is 138 copies/mL. A negative result does not preclude SARS-Cov-2 infection and should not be used as the sole basis for treatment or other patient management decisions. A negative result may occur with  improper specimen collection/handling, submission of specimen other than nasopharyngeal swab, presence of viral mutation(s) within the areas targeted by this assay, and inadequate number of viral copies(<138 copies/mL). A negative result must be combined with clinical observations, patient history, and epidemiological information. The expected result is Negative.  Fact Sheet for Patients:  EntrepreneurPulse.com.au  Fact Sheet for Healthcare Providers:  IncredibleEmployment.be  This test is no t yet approved or cleared by the [place] FDA and  has been authorized for detection and/or diagnosis of SARS-CoV-2 by FDA under an Emergency Use Authorization (EUA). This EUA will remain  in effect (meaning this test can be used) for the duration of the COVID-19 declaration under Section 564(b)(1) of the Act, 21 U.S.C.section 360bbb-3(b)(1), unless the authorization is terminated  or revoked sooner.       Influenza A by PCR NEGATIVE NEGATIVE Final   Influenza B by PCR NEGATIVE NEGATIVE Final    Comment: (NOTE) The Xpert Xpress SARS-CoV-2/FLU/RSV plus assay is intended as an aid in the diagnosis of influenza from Nasopharyngeal swab specimens and should not be used as a sole basis for treatment. Nasal washings and aspirates are unacceptable for Xpert Xpress SARS-CoV-2/FLU/RSV testing.  Fact Sheet for Patients: EntrepreneurPulse.com.au  Fact Sheet for Healthcare  Providers: IncredibleEmployment.be  This test is not yet approved or cleared by the [place] FDA and has been authorized for detection and/or diagnosis of SARS-CoV-2 by FDA under an Emergency Use Authorization (EUA). This EUA will remain in effect (meaning this test can be used) for the duration of the COVID-19 declaration under Section 564(b)(1) of the Act, 21 U.S.C. section 360bbb-3(b)(1), unless the authorization is terminated or revoked.  Performed at [facility], [address] [place], [place] [address]   Blood Culture (routine x 2)     Status: None (Preliminary result)   Collection Time: 03/29/21 12:28 AM   Specimen: BLOOD  Result Value Ref Range Status   Specimen Description BLOOD LEFT FOREARM  Final   Special Requests   Final    BOTTLES DRAWN AEROBIC AND ANAEROBIC Blood Culture adequate volume   Culture   Final    NO GROWTH 4 DAYS Performed at [facility], [address] [place], [place] [address]    Report Status PENDING  Incomplete  Blood Culture (routine x 2)     Status: None (Preliminary result)  Collection Time: 03/29/21 12:28 AM   Specimen: BLOOD  Result Value Ref Range Status   Specimen Description BLOOD LEFT THUMB  Final   Special Requests   Final    BOTTLES DRAWN AEROBIC AND ANAEROBIC Blood Culture results may not be optimal due to an inadequate volume of blood received in culture bottles   Culture   Final    NO GROWTH 4 DAYS Performed at [facility], [address], [place], [place] [address]    Report Status PENDING  Incomplete  MRSA Next Gen by PCR, Nasal     Status: None   Collection Time: 03/29/21  7:05 PM   Specimen: Nasal Mucosa; Nasal Swab  Result Value Ref Range Status   MRSA by PCR Next Gen NOT DETECTED NOT DETECTED Final    Comment: (NOTE) The GeneXpert MRSA Assay (FDA approved for NASAL specimens only), is one component of a comprehensive MRSA colonization surveillance program. It is  not intended to diagnose MRSA infection nor to guide or monitor treatment for MRSA infections. Test performance is not FDA approved in patients less than 2 years old. Performed at [facility], [address] [place], [place] [address]      Labs: BNP (last 3 results) Recent Labs    12/04/20 1611 03/17/21 2022 03/29/21 0025  BNP 756.4* 727.9* 2,956.2*   Basic Metabolic Panel: Recent Labs  Lab 03/29/21 0025 03/30/21 0906 03/31/21 1440 04/01/21 0829  NA 128* 130* 128* 129*  K 4.0 3.7 4.0 4.0  CL 92* 96* 92* 94*  CO2 28 27 26 26   GLUCOSE 288* 67* 99 135*  BUN 33* 27* 37* 43*  CREATININE 4.21* 3.60* 4.37* 4.95*  CALCIUM 8.6* 7.5* 7.7* 7.6*  PHOS  --   --   --  1.9*   Liver Function Tests: Recent Labs  Lab 03/29/21 0025 04/01/21 0829  AST 19  --   ALT 12  --   ALKPHOS 104  --   BILITOT 0.8  --   PROT 6.7  --   ALBUMIN 2.7* 2.0*   No results for input(s): LIPASE, AMYLASE in the last 168 hours. No results for input(s): AMMONIA in the last 168 hours. CBC: Recent Labs  Lab 03/29/21 0025 03/31/21 1440 04/01/21 1345  WBC 14.0* 11.7* 8.0  NEUTROABS 11.8*  --   --   HGB 11.4* 10.4* 9.7*  HCT 35.6* 30.6* 28.6*  MCV 84.2 82.3 81.9  PLT 138* 107* 100*   Cardiac Enzymes: No results for input(s): CKTOTAL, CKMB, CKMBINDEX, TROPONINI in the last 168 hours. BNP: Invalid input(s): POCBNP CBG: Recent Labs  Lab 04/01/21 1605 04/01/21 2120 04/02/21 0728 04/02/21 1131 04/02/21 1554  GLUCAP 93 144* 212* 141* 106*   D-Dimer No results for input(s): DDIMER in the last 72 hours. Hgb A1c No results for input(s): HGBA1C in the last 72 hours. Lipid Profile No results for input(s): CHOL, HDL, LDLCALC, TRIG, CHOLHDL, LDLDIRECT in the last 72 hours. Thyroid function studies No results for input(s): TSH, T4TOTAL, T3FREE, THYROIDAB in the last 72 hours.  Invalid input(s): FREET3 Anemia work up No results for input(s): VITAMINB12, FOLATE, FERRITIN, TIBC,  IRON, RETICCTPCT in the last 72 hours. Urinalysis    Component Value Date/Time   COLORURINE YELLOW (A) 03/29/2021 0020   APPEARANCEUR TURBID (A) 03/29/2021 0020   APPEARANCEUR Clear 09/15/2014 0947   LABSPEC 1.010 03/29/2021 0020   LABSPEC 1.008 09/15/2014 0947   PHURINE 5.0 03/29/2021 0020   GLUCOSEU >=500 (A) 03/29/2021 0020  GLUCOSEU 150 mg/dL 09/15/2014 0947   HGBUR MODERATE (A) 03/29/2021 0020   BILIRUBINUR NEGATIVE 03/29/2021 0020   BILIRUBINUR Negative 09/15/2014 0947   KETONESUR NEGATIVE 03/29/2021 0020   PROTEINUR 100 (A) 03/29/2021 0020   UROBILINOGEN 0.2 06/30/2010 2130   NITRITE NEGATIVE 03/29/2021 0020   LEUKOCYTESUR LARGE (A) 03/29/2021 0020   LEUKOCYTESUR Negative 09/15/2014 0947   Sepsis Labs Invalid input(s): PROCALCITONIN,  WBC,  LACTICIDVEN Microbiology Recent Results (from the past 240 hour(s))  Urine culture     Status: Abnormal   Collection Time: 03/29/21 12:20 AM   Specimen: In/Out Cath Urine  Result Value Ref Range Status   Specimen Description   Final    IN/OUT CATH URINE Performed at [facility], [address] [place], [place] [address]    Special Requests   Final    NONE Performed at [facility], [address] [place], [place] [address]    Culture (A)  Final    >=100,000 COLONIES/mL ESCHERICHIA COLI Confirmed Extended Spectrum Beta-Lactamase Producer (ESBL).  In bloodstream infections from ESBL organisms, carbapenems are preferred over piperacillin/tazobactam. They are shown to have a lower risk of mortality.    Report Status 03/31/2021 FINAL  Final   Organism ID, Bacteria ESCHERICHIA COLI (A)  Final      Susceptibility   Escherichia coli - MIC*    AMPICILLIN >=32 RESISTANT Resistant     CEFAZOLIN >=64 RESISTANT Resistant     CEFEPIME 16 RESISTANT Resistant     CEFTRIAXONE >=64 RESISTANT Resistant     CIPROFLOXACIN >=4 RESISTANT Resistant     GENTAMICIN 2 SENSITIVE Sensitive     IMIPENEM <=0.25 SENSITIVE  Sensitive     NITROFURANTOIN <=16 SENSITIVE Sensitive     TRIMETH/SULFA <=20 SENSITIVE Sensitive     AMPICILLIN/SULBACTAM >=32 RESISTANT Resistant     PIP/TAZO 8 SENSITIVE Sensitive     * >=100,000 COLONIES/mL ESCHERICHIA COLI  Resp Panel by RT-PCR (Flu A&B, Covid) Nasopharyngeal Swab     Status: None   Collection Time: 03/29/21 12:25 AM   Specimen: Nasopharyngeal Swab; Nasopharyngeal(NP) swabs in vial transport medium  Result Value Ref Range Status   SARS Coronavirus 2 by RT PCR NEGATIVE NEGATIVE Final    Comment: (NOTE) SARS-CoV-2 target nucleic acids are NOT DETECTED.  The SARS-CoV-2 RNA is generally detectable in upper respiratory specimens during the acute phase of infection. The lowest concentration of SARS-CoV-2 viral copies this assay can detect is 138 copies/mL. A negative result does not preclude SARS-Cov-2 infection and should not be used as the sole basis for treatment or other patient management decisions. A negative result may occur with  improper specimen collection/handling, submission of specimen other than nasopharyngeal swab, presence of viral mutation(s) within the areas targeted by this assay, and inadequate number of viral copies(<138 copies/mL). A negative result must be combined with clinical observations, patient history, and epidemiological information. The expected result is Negative.  Fact Sheet for Patients:  EntrepreneurPulse.com.au  Fact Sheet for Healthcare Providers:  IncredibleEmployment.be  This test is no t yet approved or cleared by the [place] FDA and  has been authorized for detection and/or diagnosis of SARS-CoV-2 by FDA under an Emergency Use Authorization (EUA). This EUA will remain  in effect (meaning this test can be used) for the duration of the COVID-19 declaration under Section 564(b)(1) of the Act, 21 U.S.C.section 360bbb-3(b)(1), unless the authorization is terminated  or revoked sooner.        Influenza A by PCR NEGATIVE NEGATIVE  Final   Influenza B by PCR NEGATIVE NEGATIVE Final    Comment: (NOTE) The Xpert Xpress SARS-CoV-2/FLU/RSV plus assay is intended as an aid in the diagnosis of influenza from Nasopharyngeal swab specimens and should not be used as a sole basis for treatment. Nasal washings and aspirates are unacceptable for Xpert Xpress SARS-CoV-2/FLU/RSV testing.  Fact Sheet for Patients: EntrepreneurPulse.com.au  Fact Sheet for Healthcare Providers: IncredibleEmployment.be  This test is not yet approved or cleared by the [place] FDA and has been authorized for detection and/or diagnosis of SARS-CoV-2 by FDA under an Emergency Use Authorization (EUA). This EUA will remain in effect (meaning this test can be used) for the duration of the COVID-19 declaration under Section 564(b)(1) of the Act, 21 U.S.C. section 360bbb-3(b)(1), unless the authorization is terminated or revoked.  Performed at [facility], [address] [place], [place] [address]   Blood Culture (routine x 2)     Status: None (Preliminary result)   Collection Time: 03/29/21 12:28 AM   Specimen: BLOOD  Result Value Ref Range Status   Specimen Description BLOOD LEFT FOREARM  Final   Special Requests   Final    BOTTLES DRAWN AEROBIC AND ANAEROBIC Blood Culture adequate volume   Culture   Final    NO GROWTH 4 DAYS Performed at [facility], [address] [place], [place] [address]    Report Status PENDING  Incomplete  Blood Culture (routine x 2)     Status: None (Preliminary result)   Collection Time: 03/29/21 12:28 AM   Specimen: BLOOD  Result Value Ref Range Status   Specimen Description BLOOD LEFT THUMB  Final   Special Requests   Final    BOTTLES DRAWN AEROBIC AND ANAEROBIC Blood Culture results may not be optimal due to an inadequate volume of blood received in culture bottles   Culture   Final    NO GROWTH 4  DAYS Performed at [facility], [address] [place], [place] [address]    Report Status PENDING  Incomplete  MRSA Next Gen by PCR, Nasal     Status: None   Collection Time: 03/29/21  7:05 PM   Specimen: Nasal Mucosa; Nasal Swab  Result Value Ref Range Status   MRSA by PCR Next Gen NOT DETECTED NOT DETECTED Final    Comment: (NOTE) The GeneXpert MRSA Assay (FDA approved for NASAL specimens only), is one component of a comprehensive MRSA colonization surveillance program. It is not intended to diagnose MRSA infection nor to guide or monitor treatment for MRSA infections. Test performance is not FDA approved in patients less than 57 years old. Performed at [facility], [address], [place],  [address]      Time coordinating discharge: Over 30 minutes  SIGNED:   Nolberto Hanlon, MD  [facility] 04/02/2021, 4:24 PM Pager   If 7PM-7AM, please contact night-coverage [email or web address] Password [email or web address]

## 2021-04-02 NOTE — Plan of Care (Signed)
  Problem: Education: Goal: Knowledge of General Education information will improve Description: Including pain rating scale, medication(s)/side effects and non-pharmacologic comfort measures 04/02/2021 1536 by Orvan Seen, RN Outcome: Completed/Met 04/02/2021 0804 by Orvan Seen, RN Outcome: Progressing   Problem: Health Behavior/Discharge Planning: Goal: Ability to manage health-related needs will improve 04/02/2021 1536 by Orvan Seen, RN Outcome: Completed/Met 04/02/2021 0804 by Orvan Seen, RN Outcome: Progressing   Problem: Clinical Measurements: Goal: Ability to maintain clinical measurements within normal limits will improve 04/02/2021 1536 by Orvan Seen, RN Outcome: Completed/Met 04/02/2021 0804 by Orvan Seen, RN Outcome: Progressing Goal: Will remain free from infection 04/02/2021 1536 by Orvan Seen, RN Outcome: Completed/Met 04/02/2021 0804 by Orvan Seen, RN Outcome: Progressing Goal: Diagnostic test results will improve 04/02/2021 1536 by Orvan Seen, RN Outcome: Completed/Met 04/02/2021 0804 by Orvan Seen, RN Outcome: Progressing Goal: Respiratory complications will improve 04/02/2021 1536 by Orvan Seen, RN Outcome: Completed/Met 04/02/2021 0804 by Orvan Seen, RN Outcome: Progressing Goal: Cardiovascular complication will be avoided 04/02/2021 1536 by Orvan Seen, RN Outcome: Completed/Met 04/02/2021 0804 by Orvan Seen, RN Outcome: Progressing   Problem: Activity: Goal: Risk for activity intolerance will decrease 04/02/2021 1536 by Orvan Seen, RN Outcome: Completed/Met 04/02/2021 0804 by Orvan Seen, RN Outcome: Progressing   Problem: Nutrition: Goal: Adequate nutrition will be maintained 04/02/2021 1536 by Orvan Seen, RN Outcome: Completed/Met 04/02/2021 0804 by Orvan Seen, RN Outcome: Progressing   Problem: Coping: Goal: Level of anxiety will decrease 04/02/2021  1536 by Orvan Seen, RN Outcome: Completed/Met 04/02/2021 0804 by Orvan Seen, RN Outcome: Progressing   Problem: Elimination: Goal: Will not experience complications related to bowel motility 04/02/2021 1536 by Orvan Seen, RN Outcome: Completed/Met 04/02/2021 0804 by Orvan Seen, RN Outcome: Progressing Goal: Will not experience complications related to urinary retention 04/02/2021 1536 by Orvan Seen, RN Outcome: Completed/Met 04/02/2021 0804 by Orvan Seen, RN Outcome: Progressing   Problem: Pain Managment: Goal: General experience of comfort will improve 04/02/2021 1536 by Orvan Seen, RN Outcome: Completed/Met 04/02/2021 0804 by Orvan Seen, RN Outcome: Progressing   Problem: Safety: Goal: Ability to remain free from injury will improve 04/02/2021 1536 by Orvan Seen, RN Outcome: Completed/Met 04/02/2021 0804 by Orvan Seen, RN Outcome: Progressing   Problem: Skin Integrity: Goal: Risk for impaired skin integrity will decrease 04/02/2021 1536 by Orvan Seen, RN Outcome: Completed/Met 04/02/2021 0804 by Orvan Seen, RN Outcome: Progressing

## 2021-04-02 NOTE — Telephone Encounter (Signed)
Patient called in this morning stating she is not sure what she needs to do going forward with appointments as she is in the hospital and not sure when she will leave.  She wants to see Dr. Delana Meyer but not sure what she should do.

## 2021-04-02 NOTE — Progress Notes (Signed)
Occupational Therapy Treatment Patient Details Name: [name] MRN: [id number] DOB: 1960-12-07 Today's Date: 04/02/2021    History of present illness [name] is a 60 y.o. female with medical history significant for ESRD on HD TTS, DM, chronic diastolic heart failure, CAD, PVD, who was brought in from home with a complaint of confusion, decreased oral intake and not acting herself. On arrival, temperature 103.2, BP 143/49, pulse 88 with O2 sat 93% on room air.  Blood work significant for WBC 14,000 with hemoglobin 11.4.  Lactic acid 1.7, sodium 128.  Troponin 30 with BNP 1338.  Urinalysis with pyuria.   OT comments  [name] was seen for OT treatment on this date. Upon arrival to pt room, pt semi-supine in bed c/o significant pain in BLE/sacral wound area and expresses distress over "blisters on my bottom". Pt declines OOB/EOB activity this date 2/2 pain. OT educates pt on importance of functional activity for maintaining physical strength and stamina for daily tasks. Pt requesting assistance to turn onto R side to take pressure off her sacral wound. NT/RN in room and assist with rolling onto R side x2 this date. Pt unable to maintain R side lying positioning independently, pillows placed to support positioning and skin integrity. Pt educated on complementary alternative methods for pain management during session. Pt making progressing toward goals and continues to benefit from skilled OT services to maximize return to PLOF and minimize risk of future falls, injury, caregiver burden, and readmission. Will continue to follow POC as written. Discharge recommendation remains appropriate.    Follow Up Recommendations  SNF    Equipment Recommendations       Recommendations for Other Services      Precautions / Restrictions Precautions Precautions: Fall Restrictions Weight Bearing Restrictions: No       Mobility Bed Mobility Overal bed mobility: Needs Assistance Bed Mobility:  Rolling Rolling: Max assist         General bed mobility comments: pt declines further mobility. Yells/cries in pain with minimal bed-level activity.    Transfers                 General transfer comment: OOB transfer not attempted    Balance Overall balance assessment: Needs assistance   Sitting balance-Leahy Scale: Poor Sitting balance - Comments: cannot come into upright sitting, 2/2 pain       Standing balance comment: Not tested                           ADL either performed or assessed with clinical judgement   ADL Overall ADL's : Needs assistance/impaired                                       General ADL Comments: Pain significantly limiting pt ability to participate in functional tasks this date. She requires MAX A to roll to R side in bed x2 this date. MAX-TOTAL A for LB ADL management 2/2 BLE pain and sacral wound pain.     Vision Patient Visual Report: No change from baseline     Perception     Praxis      Cognition Arousal/Alertness: Awake/alert Behavior During Therapy: Flat affect;WFL for tasks assessed/performed Overall Cognitive Status: Within Functional Limits for tasks assessed  General Comments: very pain averse. limited participation in session.        Exercises Other Exercises Other Exercises: Pt educated on role of OT in acute setting, complementary alternative methods for pain management including distraction techniques, importance of functional activity during hospital stay, and energy conservation. OT facilitates Rolling x2 to reposition in bed and maximize skin integrity.   Shoulder Instructions       General Comments      Pertinent Vitals/ Pain       Pain Assessment: Faces Faces Pain Scale: Hurts worst Pain Location: BLE; sacral wound Pain Descriptors / Indicators: Crying;Moaning;Stabbing;Grimacing;Guarding Pain Intervention(s): Limited activity  within patient's tolerance;Monitored during session;Premedicated before session;Repositioned;Relaxation  Home Living                                          Prior Functioning/Environment              Frequency  Min 1X/week        Progress Toward Goals  OT Goals(current goals can now be found in the care plan section)  Progress towards OT goals: Progressing toward goals  Acute Rehab OT Goals Patient Stated Goal: To have less pain OT Goal Formulation: With patient Time For Goal Achievement: 04/13/21 Potential to Achieve Goals: Good  Plan Discharge plan remains appropriate;Frequency remains appropriate    Co-evaluation                 AM-PAC OT "6 Clicks" Daily Activity     Outcome Measure   Help from another person eating meals?: A Little Help from another person taking care of personal grooming?: A Little Help from another person toileting, which includes using toliet, bedpan, or urinal?: A Lot Help from another person bathing (including washing, rinsing, drying)?: A Lot Help from another person to put on and taking off regular upper body clothing?: A Lot Help from another person to put on and taking off regular lower body clothing?: A Lot 6 Click Score: 14    End of Session    OT Visit Diagnosis: Unsteadiness on feet (R26.81);Pain;Muscle weakness (generalized) (M62.81) Pain - Right/Left:  (Both) Pain - part of body: Ankle and joints of foot;Leg;Hip (Sacral wound)   Activity Tolerance Patient limited by pain   Patient Left in bed;with call bell/phone within reach;with nursing/sitter in room;with bed alarm set   Nurse Communication          Time: 4656-8127 OT Time Calculation (min): 31 min  Charges: OT General Charges $OT Visit: 1 Visit OT Treatments $Self Care/Home Management : 23-37 mins  Shara Blazing, M.S., OTR/L Ascom: [phone] 04/02/21, 3:08 PM

## 2021-04-02 NOTE — Plan of Care (Signed)

## 2021-04-02 NOTE — Consult Note (Addendum)
WOC consult requested for Stage 1 nonblanchable area to sacrum and Stage 2 pressure injury to left buttock.  Performed remotely after review of the chart and wound care flowsheet. These can be treated by the bedside nurse using the standing skin care order set in Epic to protect from further injury:  Foam dressing to sacrum/buttocks, change Q 3 days or PRN soiling. Please re-consult if further assistance is needed.  Thank-you,  Julien Girt MSN, [place], [place], [place], [place]

## 2021-04-02 NOTE — Progress Notes (Addendum)
[facility]  ROUNDING NOTE   Subjective:   Hemodialysis yesterday Tolerated well  Patient seen sitting up in bed Eating breakfast Complains of continued generalized pain   Objective:  Vital signs in last 24 hours:  Temp:  [97.8 F (36.6 C)-98.5 F (36.9 C)] 98.5 F (36.9 C) (07/12 0727) Pulse Rate:  [69-77] 73 (07/12 0727) Resp:  [5-21] 18 (07/12 0727) BP: (128-158)/(48-77) 150/59 (07/12 0727) SpO2:  [96 %-100 %] 98 % (07/12 0727)  Weight change:  Filed Weights   03/29/21 0037  Weight: 79.3 kg    Intake/Output: I/O last 3 completed shifts: In: 300.2 [IV Piggyback:300.2] Out: 2505 [Other:2505]   Intake/Output this shift:  No intake/output data recorded.  Physical Exam: General: NAD, resting in bed  Head: Normocephalic, atraumatic. Moist oral mucosal membranes  Eyes: Anicteric  Lungs:  clear, normal effort, normal effort  Heart: Regular rate and rhythm  Abdomen:  Soft, nontender  Extremities:  1+ peripheral edema.  Neurologic: Nonfocal, moving all four extremities  Skin: No lesions, dry BLE  Access: Lt IJ Permcath    Basic Metabolic Panel: Recent Labs  Lab 03/29/21 0025 03/30/21 0906 03/31/21 1440 04/01/21 0829  NA 128* 130* 128* 129*  K 4.0 3.7 4.0 4.0  CL 92* 96* 92* 94*  CO2 28 27 26 26   GLUCOSE 288* 67* 99 135*  BUN 33* 27* 37* 43*  CREATININE 4.21* 3.60* 4.37* 4.95*  CALCIUM 8.6* 7.5* 7.7* 7.6*  PHOS  --   --   --  1.9*     Liver Function Tests: Recent Labs  Lab 03/29/21 0025 04/01/21 0829  AST 19  --   ALT 12  --   ALKPHOS 104  --   BILITOT 0.8  --   PROT 6.7  --   ALBUMIN 2.7* 2.0*    No results for input(s): LIPASE, AMYLASE in the last 168 hours. No results for input(s): AMMONIA in the last 168 hours.  CBC: Recent Labs  Lab 03/29/21 0025 03/31/21 1440 04/01/21 1345  WBC 14.0* 11.7* 8.0  NEUTROABS 11.8*  --   --   HGB 11.4* 10.4* 9.7*  HCT 35.6* 30.6* 28.6*  MCV 84.2 82.3 81.9  PLT 138* 107* 100*      Cardiac Enzymes: No results for input(s): CKTOTAL, CKMB, CKMBINDEX, TROPONINI in the last 168 hours.  BNP: Invalid input(s): POCBNP  CBG: Recent Labs  Lab 03/31/21 2027 04/01/21 0800 04/01/21 1605 04/01/21 2120 04/02/21 0728  GLUCAP 125* 128* 93 144* 212*     Microbiology: Results for orders placed or performed during the hospital encounter of 03/29/21  Urine culture     Status: Abnormal   Collection Time: 03/29/21 12:20 AM   Specimen: In/Out Cath Urine  Result Value Ref Range Status   Specimen Description   Final    IN/OUT CATH URINE Performed at [facility], [address] [place], [place] [address]    Special Requests   Final    NONE Performed at [facility], [address] [place], [place] [address]    Culture (A)  Final    >=100,000 COLONIES/mL ESCHERICHIA COLI Confirmed Extended Spectrum Beta-Lactamase Producer (ESBL).  In bloodstream infections from ESBL organisms, carbapenems are preferred over piperacillin/tazobactam. They are shown to have a lower risk of mortality.    Report Status 03/31/2021 FINAL  Final   Organism ID, Bacteria ESCHERICHIA COLI (A)  Final      Susceptibility   Escherichia coli - MIC*    AMPICILLIN >=  32 RESISTANT Resistant     CEFAZOLIN >=64 RESISTANT Resistant     CEFEPIME 16 RESISTANT Resistant     CEFTRIAXONE >=64 RESISTANT Resistant     CIPROFLOXACIN >=4 RESISTANT Resistant     GENTAMICIN 2 SENSITIVE Sensitive     IMIPENEM <=0.25 SENSITIVE Sensitive     NITROFURANTOIN <=16 SENSITIVE Sensitive     TRIMETH/SULFA <=20 SENSITIVE Sensitive     AMPICILLIN/SULBACTAM >=32 RESISTANT Resistant     PIP/TAZO 8 SENSITIVE Sensitive     * >=100,000 COLONIES/mL ESCHERICHIA COLI  Resp Panel by RT-PCR (Flu A&B, Covid) Nasopharyngeal Swab     Status: None   Collection Time: 03/29/21 12:25 AM   Specimen: Nasopharyngeal Swab; Nasopharyngeal(NP) swabs in vial transport medium  Result Value Ref Range Status   SARS  Coronavirus 2 by RT PCR NEGATIVE NEGATIVE Final    Comment: (NOTE) SARS-CoV-2 target nucleic acids are NOT DETECTED.  The SARS-CoV-2 RNA is generally detectable in upper respiratory specimens during the acute phase of infection. The lowest concentration of SARS-CoV-2 viral copies this assay can detect is 138 copies/mL. A negative result does not preclude SARS-Cov-2 infection and should not be used as the sole basis for treatment or other patient management decisions. A negative result may occur with  improper specimen collection/handling, submission of specimen other than nasopharyngeal swab, presence of viral mutation(s) within the areas targeted by this assay, and inadequate number of viral copies(<138 copies/mL). A negative result must be combined with clinical observations, patient history, and epidemiological information. The expected result is Negative.  Fact Sheet for Patients:  EntrepreneurPulse.com.au  Fact Sheet for Healthcare Providers:  IncredibleEmployment.be  This test is no t yet approved or cleared by the [place] FDA and  has been authorized for detection and/or diagnosis of SARS-CoV-2 by FDA under an Emergency Use Authorization (EUA). This EUA will remain  in effect (meaning this test can be used) for the duration of the COVID-19 declaration under Section 564(b)(1) of the Act, 21 U.S.C.section 360bbb-3(b)(1), unless the authorization is terminated  or revoked sooner.       Influenza A by PCR NEGATIVE NEGATIVE Final   Influenza B by PCR NEGATIVE NEGATIVE Final    Comment: (NOTE) The Xpert Xpress SARS-CoV-2/FLU/RSV plus assay is intended as an aid in the diagnosis of influenza from Nasopharyngeal swab specimens and should not be used as a sole basis for treatment. Nasal washings and aspirates are unacceptable for Xpert Xpress SARS-CoV-2/FLU/RSV testing.  Fact Sheet for  Patients: EntrepreneurPulse.com.au  Fact Sheet for Healthcare Providers: IncredibleEmployment.be  This test is not yet approved or cleared by the [place] FDA and has been authorized for detection and/or diagnosis of SARS-CoV-2 by FDA under an Emergency Use Authorization (EUA). This EUA will remain in effect (meaning this test can be used) for the duration of the COVID-19 declaration under Section 564(b)(1) of the Act, 21 U.S.C. section 360bbb-3(b)(1), unless the authorization is terminated or revoked.  Performed at [facility], [address] [place], [place] [address]   Blood Culture (routine x 2)     Status: None (Preliminary result)   Collection Time: 03/29/21 12:28 AM   Specimen: BLOOD  Result Value Ref Range Status   Specimen Description BLOOD LEFT FOREARM  Final   Special Requests   Final    BOTTLES DRAWN AEROBIC AND ANAEROBIC Blood Culture adequate volume   Culture   Final    NO GROWTH 4 DAYS Performed at [facility], [place], [place]  [address]    Report Status PENDING  Incomplete  Blood Culture (routine x 2)     Status: None (Preliminary result)   Collection Time: 03/29/21 12:28 AM   Specimen: BLOOD  Result Value Ref Range Status   Specimen Description BLOOD LEFT THUMB  Final   Special Requests   Final    BOTTLES DRAWN AEROBIC AND ANAEROBIC Blood Culture results may not be optimal due to an inadequate volume of blood received in culture bottles   Culture   Final    NO GROWTH 4 DAYS Performed at [facility], [address] [place], [place] [address]    Report Status PENDING  Incomplete  MRSA Next Gen by PCR, Nasal     Status: None   Collection Time: 03/29/21  7:05 PM   Specimen: Nasal Mucosa; Nasal Swab  Result Value Ref Range Status   MRSA by PCR Next Gen NOT DETECTED NOT DETECTED Final    Comment: (NOTE) The GeneXpert MRSA Assay (FDA approved for NASAL specimens  only), is one component of a comprehensive MRSA colonization surveillance program. It is not intended to diagnose MRSA infection nor to guide or monitor treatment for MRSA infections. Test performance is not FDA approved in patients less than 55 years old. Performed at [facility], [address] [place], [place] [address]     Coagulation Studies: No results for input(s): LABPROT, INR in the last 72 hours.   Urinalysis: No results for input(s): COLORURINE, LABSPEC, PHURINE, GLUCOSEU, HGBUR, BILIRUBINUR, KETONESUR, PROTEINUR, UROBILINOGEN, NITRITE, LEUKOCYTESUR in the last 72 hours.  Invalid input(s): APPERANCEUR     Imaging: CT SOFT TISSUE NECK W CONTRAST  Result Date: 04/01/2021 CLINICAL DATA:  Neck mass, abnormal ultrasound EXAM: CT NECK WITH CONTRAST TECHNIQUE: Multidetector CT imaging of the neck was performed using the standard protocol following the bolus administration of intravenous contrast. CONTRAST:  61mL OMNIPAQUE IOHEXOL 300 MG/ML  SOLN COMPARISON:  Correlation made with upper extremity ultrasound 03/31/2021 FINDINGS: Pharynx and larynx: Unremarkable.  No mass or swelling. Salivary glands: Unremarkable. Thyroid: Unremarkable. Lymph nodes: Enlarged right supraclavicular node measuring 2.1 cm (series 2, image 73). No additional enlarged nodes identified. Vascular: There is narrowing of the central right internal jugular vein. No flow within the central left internal jugular vein. Mixed plaque at the right ICA origin. Left subclavian and brachiocephalic vein stenting. No apparent subclavian flow bilaterally. Given patency on recent ultrasound, presumably due to phase of study. Extensive venous collateral formation. Left IJ approach catheter partially imaged. Calcified plaque at the left ICA origin. Likely similar to prior CTA. Limited intracranial: No abnormal enhancement. Visualized orbits: No significant abnormality. Mastoids and visualized paranasal sinuses: Left  mastoid and middle ear effusions. No significant paranasal sinus opacification. Skeleton: No acute abnormality. Upper chest: Mild interlobular septal thickening may reflect edema. Small left pleural effusion with areas of loculation. Chest wall edema. Other: Subcutaneous edema. IMPRESSION: 2 cm enlarged right supraclavicular node. No additional enlarged nodes are identified. Subcutaneous edema, mild interstitial pulmonary edema, and partially loculated small left pleural effusion suggesting volume overload. Venous collateral formation is noted. There is no apparent flow in the subclavian or central internal jugular veins. May reflect timing of study and reduced flow, noting reported patency on ultrasound. Electronically Signed   By: Macy Mis M.D.   On: 04/01/2021 11:06   US Venous Img Upper Uni Right(DVT)  Result Date: 03/31/2021 CLINICAL DATA:  Right upper extremity over 1 year EXAM: RIGHT UPPER EXTREMITY VENOUS DOPPLER ULTRASOUND TECHNIQUE: Gray-scale  sonography with graded compression, as well as color Doppler and duplex ultrasound were performed to evaluate the upper extremity deep venous system from the level of the subclavian vein and including the jugular, axillary, basilic, radial, ulnar and upper cephalic vein. Spectral Doppler was utilized to evaluate flow at rest and with distal augmentation maneuvers. COMPARISON:  08/11/2020 FINDINGS: Contralateral Subclavian Vein: Respiratory phasicity is normal and symmetric with the symptomatic side. No evidence of thrombus. Normal compressibility. Internal Jugular Vein: No evidence of thrombus. Normal compressibility, respiratory phasicity and response to augmentation. Subclavian Vein: No evidence of thrombus. Normal compressibility, respiratory phasicity and response to augmentation. Axillary Vein: No evidence of thrombus. Normal compressibility, respiratory phasicity and response to augmentation. Cephalic Vein: No evidence of thrombus. Normal  compressibility, respiratory phasicity and response to augmentation. Basilic Vein: No evidence of thrombus. Normal compressibility, respiratory phasicity and response to augmentation. Brachial Veins: No evidence of thrombus. Normal compressibility, respiratory phasicity and response to augmentation. Radial Veins: No evidence of thrombus. Normal compressibility, respiratory phasicity and response to augmentation. Ulnar Veins: No evidence of thrombus. Normal compressibility, respiratory phasicity and response to augmentation. Other Findings: 2.0 x 1.4 x 1.1 cm ovoid hypoechoic structure in the right supraclavicular region is suspicious for abnormally enlarged lymph node. Thrombosed right upper extremity graft is noted. In the right medial upper arm there is a 5.1 x 2.1 x 2.1 cm ovoid hypoechoic mass. This may be related to a thrombosed pseudoaneurysm. IMPRESSION: 1. No right upper extremity DVT. 2. Abnormally enlarged lymph node seen in the right supraclavicular region measuring 2.0 x 1.4 x 1.1 cm should be further evaluated with contrast enhanced soft tissue neck CT. 3. 5.1 x 2.1 x 2.1 cm ovoid hypoechoic mass in the right upper arm is most likely a thrombosed pseudoaneurysm related to occluded dialysis graft. Recommend confirmation with contrast enhanced MRI or CT of the right upper arm. Electronically Signed   By: Miachel Roux M.D.   On: 03/31/2021 13:02     Medications:    piperacillin-tazobactam (ZOSYN)  IV 2.25 g (04/02/21 0535)    aspirin EC  81 mg Oral Daily   Chlorhexidine Gluconate Cloth  6 each Topical Q0600   heparin  5,000 Units Subcutaneous Q8H   insulin aspart  0-5 Units Subcutaneous QHS   insulin aspart  0-9 Units Subcutaneous TID WC   lidocaine  1 patch Transdermal Q24H   lipase/protease/amylase  12,000 Units Oral TID AC   multivitamin  1 tablet Oral QHS   pantoprazole  40 mg Oral Daily   sevelamer carbonate  800 mg Oral TID WC & HS   acetaminophen **OR** acetaminophen,  HYDROcodone-acetaminophen, hydrOXYzine  Assessment/ Plan:  [name] is a 60 y.o.  female with a past medical history of diabetes, dCHF, CAD, PVD, and ESRD on dialysis. She presents to the ED with complaints of confusion and poor intake.   [facility] MWF Lt Permcath 75.5 kg  End Stage Renal Disease on dialysis: hemodialysis yesterday, tolerated well. UF 2.5L removed. Next treatment scheduled for tomorrow  2. Anemia of chronic kidney disease  Lab Results  Component Value Date   HGB 9.7 (L) 04/01/2021  Hgb within range EPO outpatient   3. Secondary Hyperparathyroidism:   Lab Results  Component Value Date   PTH 357 (H) 10/05/2018   CALCIUM 7.6 (L) 04/01/2021   CAION 1.09 (L) 04/13/2020   PHOS 1.9 (L) 04/01/2021    Phosphorus decreased, will recheck with am labs   Continue to hold  calcium carbonate and sevelamer  4.Diabetes mellitus type II with chronic kidney disease Non-insulin dependent.  Most recent hemoglobin A1c is 6.2 on 03/29/21.     LOS: 4   7/12/20229:20 AM

## 2021-04-02 NOTE — TOC Transition Note (Signed)
Transition of Care [facility]) - CM/SW Discharge Note   Patient Details  Name: [name] MRN: [id number] Date of Birth: 07-05-1961  Transition of Care [facility]) CM/SW Contact:  Shelbie Hutching, RN Phone Number: 04/02/2021, 4:04 PM   Clinical Narrative:    Patient medically cleared for discharge home with home health services.  [facility] has accepted home health referral, Malachy Mood with [facility] aware of discharge today.  Patient agrees to EMS transport home due to weakness and recommendation for SNF.  Patient does have her keys and can access the residence.  EMS has been arranged.  Patient requested a pair of shoes, RNCM was able to get patient a size 10 pair of slip on shoes from the TOC donated items.   Patient is happy about the shoes and being set up with [facility].   Patient is next up on the list for transport with EMS.   Final next level of care: [place] Barriers to Discharge: Barriers Resolved   Patient Goals and CMS Choice Patient states their goals for this hospitalization and ongoing recovery are:: "I don't want to go to another nursing home" CMS Medicare.gov Compare Post Acute Care list provided to:: Patient Choice offered to / list presented to : Patient  Discharge Placement                       Discharge Plan and Services In-house Referral: Clinical Social Work   Post Acute Care Choice: Home Health          DME Arranged: N/A DME Agency: NA       HH Arranged: RN, PT, OT, Nurse's Aide, Social Work [employer] Agency: [place] Date [place] Agency Contacted: 04/02/21 Time [place]: 1604 Representative spoke with at [place]: [place] Determinants of Health ([place]) Interventions     Readmission Risk Interventions Readmission Risk Prevention Plan 12/11/2020 09/21/2020 08/14/2020  Transportation Screening Complete Complete -  Medication Review Press photographer) Complete Complete -  PCP or Specialist appointment within 3-5  days of discharge Complete Complete Complete  HRI or Home Care Consult Complete Complete Complete  SW Recovery Care/Counseling Consult Complete Complete Complete  Palliative Care Screening Not Applicable Complete Complete  [place] Not Complete Complete Not Applicable  SNF Comments working on placement - -  Some recent data might be hidden

## 2021-04-02 NOTE — Consult Note (Signed)
[facility] Consult Note  MRN : [id number]  [name] is a 60 y.o. ([birth year]) female who presents with chief complaint of  Chief Complaint  Patient presents with   Code Sepsis   History of Present Illness:  [name] is a 60 y.o. female with medical history significant for ESRD on HD TTS, DM, chronic diastolic heart failure, CAD, PVD, who was brought in from home with a complaint of confusion, decreased oral intake and not acting herself.    The patient reports experiencing chills but no fever. Had no cough or complaints of chest pain or shortness of breath, vomiting or diarrhea.  At baseline she is able to ambulate with walker and is usually alert and oriented.  ED course: On arrival, temperature 103.2, BP 143/49, pulse 88 with O2 sat 93% on room air.  Blood work significant for WBC 14,000 with hemoglobin 11.4.  Lactic acid 1.7, sodium 128.  Troponin 30 with BNP 1338.  Urinalysis with pyuria. EKG, personally viewed and interpreted: Sinus rhythm at 95 with no acute ST-T wave changes Imaging: Chest x-ray with cardiomegaly and pulmonary vascular congestion without frank interstitial edema, mild bibasilar opacities likely atelectasis CT head with no acute intracranial findings  Patient was started on broad-spectrum antibiotics and admitted for treatment of UTI.  Vascular surgery was consulted by Dr. Kurtis Bushman for swelling to the upper extremity associated with some discomfort.  Current Facility-Administered Medications  Medication Dose Route Frequency Provider Last Rate Last Admin   acetaminophen (TYLENOL) tablet 650 mg  650 mg Oral Q6H PRN Athena Masse, MD   650 mg at 04/02/21 1230   Or   acetaminophen (TYLENOL) suppository 650 mg  650 mg Rectal Q6H PRN Athena Masse, MD       aspirin EC tablet 81 mg  81 mg Oral Daily Nolberto Hanlon, MD   81 mg at 04/02/21 0834   Chlorhexidine Gluconate Cloth 2 % PADS 6 each  6 each Topical Q0600 Lavonia Dana, MD    6 each at 04/02/21 0534   heparin injection 5,000 Units  5,000 Units Subcutaneous Q8H Athena Masse, MD   5,000 Units at 04/02/21 0534   HYDROcodone-acetaminophen (NORCO/VICODIN) 5-325 MG per tablet 1 tablet  1 tablet Oral Q6H PRN Athena Masse, MD   1 tablet at 04/02/21 0834   hydrOXYzine (ATARAX/VISTARIL) tablet 25 mg  25 mg Oral TID PRN Athena Masse, MD   25 mg at 03/30/21 0025   insulin aspart (novoLOG) injection 0-5 Units  0-5 Units Subcutaneous QHS Judd Gaudier V, MD       insulin aspart (novoLOG) injection 0-9 Units  0-9 Units Subcutaneous TID WC Athena Masse, MD   1 Units at 04/02/21 1230   lidocaine (LIDODERM) 5 % 1 patch  1 patch Transdermal Q24H Nolberto Hanlon, MD   1 patch at 04/02/21 0833   lipase/protease/amylase (CREON) capsule 12,000 Units  12,000 Units Oral TID Marga Hoots, MD   12,000 Units at 04/02/21 1231   multivitamin (RENA-VIT) tablet 1 tablet  1 tablet Oral QHS Athena Masse, MD   1 tablet at 04/01/21 2158   pantoprazole (PROTONIX) EC tablet 40 mg  40 mg Oral Daily Nolberto Hanlon, MD   40 mg at 04/02/21 0834   piperacillin-tazobactam (ZOSYN) IVPB 2.25 g  2.25 g Intravenous Loletta Parish, MD 100 mL/hr at 04/02/21 0535 2.25 g at 04/02/21 0535   sevelamer carbonate (RENVELA) tablet 800 mg  800 mg Oral TID WC & HS Vira Blanco, RPH   800 mg at 04/02/21 1231   witch hazel-glycerin (TUCKS) pad   Topical PRN Colon Flattery, NP       Past Medical History:  Diagnosis Date   Anemia    Anginal pain ([place])    Anxiety    Arthritis    Asthma    Broken wrist    Bronchitis    chronic diastolic CHF 3/81/8299   Chronic kidney disease    COPD (chronic obstructive pulmonary disease) ([place])    Coronary artery disease    a. cath 2013: stenting to RCA (report not available); b. cath 2014: LM nl, pLAD 40%, mLAD nl, ost LCx 40%, mid LCx nl, pRCA 30% @ site of prior stent, mRCA 50%   Depression    Diabetes mellitus ([place])    Diabetes mellitus without complication ([place])     Diabetic neuropathy ([place])    dialysis 2006   Diverticulosis    Dizziness    Dyspnea    Elevated lipids    Environmental and seasonal allergies    ESRD (end stage renal disease) on dialysis ([place])    M-W-F   Gastroparesis    GERD (gastroesophageal reflux disease)    Headache    History of anemia due to chronic kidney disease    History of hiatal hernia    HOH (hard of hearing)    Hx of pancreatitis 2015   Hypertension    Lower extremity edema    Mitral regurgitation    a. echo 10/2013: EF 62%, noWMA, mildly dilated LA, mild to mod MR/TR, GR1DD   Myocardial infarction ([place])    Orthopnea    Parathyroid abnormality ([place])    Peripheral arterial disease ([place])    Pneumonia    Renal cancer ([place])    Renal insufficiency    Pt is on dialysis on M,W + F.   Wheezing    Past Surgical History:  Procedure Laterality Date   A/V SHUNTOGRAM Left 01/20/2018   Procedure: A/V SHUNTOGRAM;  Surgeon: Algernon Huxley, MD;  Location: [facility];  Service: Cardiovascular;  Laterality: Left;   ABDOMINAL HYSTERECTOMY  1992   AMPUTATION TOE Left 10/02/2017   Procedure: AMPUTATION TOE-LEFT GREAT TOE;  Surgeon: Albertine Patricia, DPM;  Location: [facility];  Service: Podiatry;  Laterality: Left;   APPENDECTOMY     APPLICATION OF WOUND VAC N/A 11/25/2019   Procedure: APPLICATION OF WOUND VAC;  Surgeon: Katha Cabal, MD;  Location: [facility];  Service: Vascular;  Laterality: N/A;   ARTERY BIOPSY Right 10/11/2018   Procedure: BIOPSY TEMPORAL ARTERY;  Surgeon: Vickie Epley, MD;  Location: [facility];  Service: General;  Laterality: Right;   CARDIAC CATHETERIZATION Left 07/26/2015   Procedure: Left Heart Cath and Coronary Angiography;  Surgeon: Dionisio David, MD;  Location: [facility];  Service: Cardiovascular;  Laterality: Left;   CATARACT EXTRACTION W/ INTRAOCULAR LENS IMPLANT Right    CATARACT EXTRACTION W/PHACO Left 03/10/2017   Procedure: CATARACT EXTRACTION PHACO AND INTRAOCULAR LENS  PLACEMENT ([place]);  Surgeon: Birder Robson, MD;  Location: [facility];  Service: Ophthalmology;  Laterality: Left;  [place] 00:51.9 AP% 14.2 CDE 7.39 fluid pack lot # [id number]   CENTRAL LINE INSERTION Right 11/11/2019   Procedure: CENTRAL LINE INSERTION;  Surgeon: Katha Cabal, MD;  Location: [facility];  Service: Vascular;  Laterality: Right;   CENTRAL LINE INSERTION  11/25/2019   Procedure: CENTRAL LINE INSERTION;  Surgeon: Katha Cabal, MD;  Location: [facility];  Service: Vascular;;   CHOLECYSTECTOMY     COLONOSCOPY WITH PROPOFOL N/A 08/12/2016   Procedure: COLONOSCOPY WITH PROPOFOL;  Surgeon: Lollie Sails, MD;  Location: [facility]COPY;  Service: Endoscopy;  Laterality: N/A;   DIALYSIS FISTULA CREATION Left    upper arm   dialysis grafts     DIALYSIS/PERMA CATHETER INSERTION N/A 11/14/2019   Procedure: DIALYSIS/PERMA CATHETER INSERTION;  Surgeon: Algernon Huxley, MD;  Location: [facility];  Service: Cardiovascular;  Laterality: N/A;   DIALYSIS/PERMA CATHETER INSERTION N/A 02/03/2020   Procedure: DIALYSIS/PERMA CATHETER INSERTION;  Surgeon: Katha Cabal, MD;  Location: [facility];  Service: Cardiovascular;  Laterality: N/A;   DIALYSIS/PERMA CATHETER INSERTION N/A 06/19/2020   Procedure: DIALYSIS/PERMA CATHETER INSERTION;  Surgeon: Katha Cabal, MD;  Location: [facility];  Service: Cardiovascular;  Laterality: N/A;   DIALYSIS/PERMA CATHETER REMOVAL N/A 05/25/2020   Procedure: DIALYSIS/PERMA CATHETER REMOVAL;  Surgeon: Katha Cabal, MD;  Location: [facility];  Service: Cardiovascular;  Laterality: N/A;   ESOPHAGOGASTRODUODENOSCOPY N/A 03/08/2015   Procedure: ESOPHAGOGASTRODUODENOSCOPY (EGD);  Surgeon: Manya Silvas, MD;  Location[facility];  Service: Endoscopy;  Laterality: N/A;   ESOPHAGOGASTRODUODENOSCOPY (EGD) WITH PROPOFOL N/A 03/18/2016   Procedure: ESOPHAGOGASTRODUODENOSCOPY (EGD) WITH PROPOFOL;  Surgeon: [name], MD;   Location: [facility];  Service: Endoscopy;  Laterality: N/A;   EYE SURGERY Right 2018   FECAL TRANSPLANT N/A 08/23/2015   Procedure: FECAL TRANSPLANT;  Surgeon: Manya Silvas, MD;  Location: [facility];  Service: Endoscopy;  Laterality: N/A;   HAND SURGERY Bilateral    HEMATOMA EVACUATION Left 11/25/2019   Procedure: EVACUATION HEMATOMA;  Surgeon: Katha Cabal, MD;  Location: [facility];  Service: Vascular;  Laterality: Left;   I & D EXTREMITY Left 11/25/2019   Procedure: IRRIGATION AND DEBRIDEMENT EXTREMITY;  Surgeon: Katha Cabal, MD;  Location: [facility];  Service: Vascular;  Laterality: Left;   IR FLUORO GUIDE CV LINE RIGHT  04/06/2020   IR INJECT/THERA/INC NEEDLE/CATH/PLC EPI/CERV/THOR W/IMG  08/13/2020   IR RADIOLOGIST EVAL & MGMT  07/28/2019   IR RADIOLOGIST EVAL & MGMT  08/11/2019   LIGATION OF ARTERIOVENOUS  FISTULA Left 11/11/2019   LIGATION OF ARTERIOVENOUS  FISTULA Left 11/11/2019   Procedure: LIGATION OF ARTERIOVENOUS  FISTULA;  Surgeon: Katha Cabal, MD;  Location: [facility];  Service: Vascular;  Laterality: Left;   LIGATIONS OF HERO GRAFT Right 06/13/2020   Procedure: LIGATION / REMOVAL OF RIGHT HERO GRAFT;  Surgeon: Katha Cabal, MD;  Location: [facility];  Service: Vascular;  Laterality: Right;   PERIPHERAL VASCULAR CATHETERIZATION N/A 12/20/2015   Procedure: Thrombectomy of dialysis access versus permcath placement;  Surgeon: Algernon Huxley, MD;  Location: [facility];  Service: Cardiovascular;  Laterality: N/A;   PERIPHERAL VASCULAR CATHETERIZATION N/A 12/20/2015   Procedure: A/V Shunt Intervention;  Surgeon: Algernon Huxley, MD;  Location: [facility];  Service: Cardiovascular;  Laterality: N/A;   PERIPHERAL VASCULAR CATHETERIZATION N/A 12/20/2015   Procedure: A/V Shuntogram/Fistulagram;  Surgeon: Algernon Huxley, MD;  Location: [facility];  Service: Cardiovascular;  Laterality: N/A;   PERIPHERAL VASCULAR CATHETERIZATION N/A 01/02/2016    Procedure: A/V Shuntogram/Fistulagram;  Surgeon: Algernon Huxley, MD;  Location: [facility];  Service: Cardiovascular;  Laterality: N/A;   PERIPHERAL VASCULAR CATHETERIZATION N/A 01/02/2016   Procedure: A/V Shunt Intervention;  Surgeon: Algernon Huxley, MD;  Location: [facility];  Service: Cardiovascular;  Laterality: N/A;   TEE WITHOUT CARDIOVERSION N/A 06/11/2020   Procedure: TRANSESOPHAGEAL ECHOCARDIOGRAM (TEE);  Surgeon: Teodoro Spray, MD;  Location: [facility];  Service: Cardiovascular;  Laterality: N/A;   UPPER EXTREMITY ANGIOGRAPHY Bilateral 11/27/2020   Procedure: UPPER EXTREMITY ANGIOGRAPHY;  Surgeon: Katha Cabal, MD;  Location: [facility];  Service: Cardiovascular;  Laterality: Bilateral;   UPPER EXTREMITY VENOGRAPHY Right 01/18/2020   Procedure: UPPER EXTREMITY VENOGRAPHY;  Surgeon: Katha Cabal, MD;  Location: [facility];  Service: Cardiovascular;  Laterality: Right;   UPPER EXTREMITY VENOGRAPHY Bilateral 11/27/2020   Procedure: UPPER EXTREMITY VENOGRAPHY;  Surgeon: Katha Cabal, MD;  Location: [facility];  Service: Cardiovascular;  Laterality: Bilateral;   VASCULAR ACCESS DEVICE INSERTION Right 04/13/2020   Procedure: INSERTION OF HERO VASCULAR ACCESS DEVICE (GRAFT);  Surgeon: Katha Cabal, MD;  Location: [facility];  Service: Vascular;  Laterality: Right;   Social History Social History   Tobacco Use   Smoking status: Never   Smokeless tobacco: Never  Vaping Use   Vaping Use: Never used  Substance Use Topics   Alcohol use: Not Currently    Comment: glass wine week per pt   Drug use: Yes    Types: Marijuana    Comment: once a day   Family History Family History  Problem Relation Age of Onset   Kidney disease Mother    Diabetes Mother    Cancer Father    Kidney disease Sister   Positive for kidney disease.  Denies peripheral artery disease or venous disease.  Allergies  Allergen Reactions   Ace Inhibitors  Swelling and Anaphylaxis   Ativan [Lorazepam] Other (See Comments)    Reaction:  Hallucinations and headaches   Compazine [Prochlorperazine Edisylate] Anaphylaxis, Nausea And Vomiting and Other (See Comments)    Other reaction(s): dystonia from this vs. Reglan, 23 Jul - patient relates that she takes promethazine frequently with no problems   Sumatriptan Succinate Other (See Comments)    Other reaction(s): delirium and hallucinations per [facility] records   Zofran [Ondansetron] Nausea And Vomiting    Per pt. she is allergic to zofran or will experience adverse reaction like hallucination    Losartan Nausea Only   Prochlorperazine Other (See Comments)    Reaction:  Unknown . Patient does not remember reaction but she does have vertigo and anxiety along with n and v at times. Could be used to treat any of these    Reglan [Metoclopramide] Other (See Comments)    Per patient her Dr. Evelina Bucy her off it    Scopolamine Other (See Comments)    Dizziness, also has vertigo already   Tape Rash    Plastic tape causes rash   Tapentadol Rash   Ultrasound Gel Itching    Patient states the Ultrasound gel caused itching while in skin contact and resolved when wiped away.   REVIEW OF SYSTEMS (Negative unless checked)  Constitutional: [] Weight loss  [] Fever  [] Chills Cardiac: [] Chest pain   [] Chest pressure   [] Palpitations   [] Shortness of breath when laying flat   [] Shortness of breath at rest   [x] Shortness of breath with exertion. Vascular:  [] Pain in legs with walking   [] Pain in legs at rest   [] Pain in legs when laying flat   [] Claudication   [] Pain in feet when walking  [] Pain in feet at rest  [] Pain in feet when laying flat   [] History  of DVT   [] Phlebitis   [] Swelling in legs   [] Varicose veins   [] Non-healing ulcers Pulmonary:   [] Uses home oxygen   [] Productive cough   [] Hemoptysis   [] Wheeze  [] COPD   [] Asthma Neurologic:  [] Dizziness  [] Blackouts   [] Seizures   [] History of stroke   [] History of TIA   [] Aphasia   [] Temporary blindness   [] Dysphagia   [] Weakness or numbness in arms   [] Weakness or numbness in legs Musculoskeletal:  [] Arthritis   [] Joint swelling   [] Joint pain   [] Low back pain Hematologic:  [] Easy bruising  [] Easy bleeding   [] Hypercoagulable state   [] Anemic  [] Hepatitis Gastrointestinal:  [] Blood in stool   [] Vomiting blood  [] Gastroesophageal reflux/heartburn   [] Difficulty swallowing. Genitourinary:  [x] Chronic kidney disease   [] Difficult urination  [] Frequent urination  [] Burning with urination   [] Blood in urine Skin:  [] Rashes   [] Ulcers   [] Wounds Psychological:  [] History of anxiety   []  History of major depression.  Physical Examination  Vitals:   04/01/21 2328 04/02/21 0349 04/02/21 0727 04/02/21 1127  BP: (!) 132/50 (!) 128/48 (!) 150/59 (!) 134/53  Pulse: 75 75 73 72  Resp: 16 16 18 16   Temp: 97.9 F (36.6 C) 98.2 F (36.8 C) 98.5 F (36.9 C) 98.3 F (36.8 C)  TempSrc: Oral  Oral Oral  SpO2: 96% 97% 98% 97%  Weight:      Height:       Body mass index is 29.1 kg/m. Gen:  WD/WN, NAD Head: [place]/AT, No temporalis wasting. Prominent temp pulse not noted. Ear/Nose/Throat: Hearing grossly intact, nares w/o erythema or drainage, oropharynx w/o Erythema/Exudate Eyes: Sclera non-icteric, conjunctiva clear Neck: Trachea midline.  No JVD.  Pulmonary:  Good air movement, respirations not labored, equal bilaterally.  Cardiac: RRR, normal S1, S2. Vascular:  Vessel Right Left  Radial Palpable Palpable  Ulnar Palpable Palpable                               Gastrointestinal: soft, non-tender/non-distended. No guarding/reflex.  Musculoskeletal: M/S 5/5 throughout.  Extremities without ischemic changes.  No deformity or atrophy. No edema. Neurologic: Sensation grossly intact in extremities.  Symmetrical.  Speech is fluent. Motor exam as listed above. Psychiatric: Judgment intact, Mood & affect appropriate for pt's clinical situation. Dermatologic: No  rashes or ulcers noted.  No cellulitis or open wounds. Lymph : No Cervical, Axillary, or Inguinal lymphadenopathy.  CBC Lab Results  Component Value Date   WBC 8.0 04/01/2021   HGB 9.7 (L) 04/01/2021   HCT 28.6 (L) 04/01/2021   MCV 81.9 04/01/2021   PLT 100 (L) 04/01/2021   BMET    Component Value Date/Time   NA 129 (L) 04/01/2021 0829   NA 135 (L) 09/15/2014 0948   K 4.0 04/01/2021 0829   K 4.8 09/15/2014 0948   CL 94 (L) 04/01/2021 0829   CL 99 09/15/2014 0948   CO2 26 04/01/2021 0829   CO2 26 09/15/2014 0948   GLUCOSE 135 (H) 04/01/2021 0829   GLUCOSE 118 (H) 09/15/2014 0948   BUN 43 (H) 04/01/2021 0829   BUN 19 (H) 09/15/2014 0948   CREATININE 4.95 (H) 04/01/2021 0829   CREATININE 6.79 (H) 09/15/2014 0948   CALCIUM 7.6 (L) 04/01/2021 0829   CALCIUM 8.3 (L) 09/15/2014 0948   GFRNONAA 9 (L) 04/01/2021 0829   GFRNONAA 7 (L) 09/15/2014 0948   GFRNONAA 6 (L) 05/31/2014 6712  GFRAA 11 (L) 06/25/2020 0641   GFRAA 8 (L) 09/15/2014 0948   GFRAA 7 (L) 05/31/2014 0432   Estimated Creatinine Clearance: 12.4 mL/min (A) (by C-G formula based on SCr of 4.95 mg/dL (H)).  COAG Lab Results  Component Value Date   INR 1.3 (H) 03/29/2021   INR 1.3 (H) 03/17/2021   INR 1.3 (H) 02/14/2021   Radiology DG Shoulder Right  Result Date: 03/30/2021 CLINICAL DATA:  Right shoulder pain EXAM: RIGHT SHOULDER - 2+ VIEW COMPARISON:  None. FINDINGS: No acute fracture or dislocation is noted. No soft tissue abnormality is seen. Dialysis catheter is noted from the left as well as stenting in the right arm and left innominate vein. Mild degenerative changes of the glenohumeral joint and acromioclavicular joint are seen. Bony thorax appears within normal limits. IMPRESSION: No acute abnormality noted.  Degenerative changes are seen. Electronically Signed   By: Inez Catalina M.D.   On: 03/30/2021 19:26   CT Head Wo Contrast  Result Date: 03/29/2021 CLINICAL DATA:  Altered mental status EXAM: CT HEAD  WITHOUT CONTRAST TECHNIQUE: Contiguous axial images were obtained from the base of the skull through the vertex without intravenous contrast. COMPARISON:  09/17/2020 FINDINGS: Brain: No evidence of acute infarction, hemorrhage, hydrocephalus, extra-axial collection or mass lesion/mass effect. Vascular: No hyperdense vessel or unexpected calcification. Skull: Normal. Negative for fracture or focal lesion. Sinuses/Orbits: The visualized paranasal sinuses are essentially clear. The mastoid air cells are unopacified. Other: None. IMPRESSION: Normal head CT. Electronically Signed   By: Julian Hy M.D.   On: 03/29/2021 01:12   CT SOFT TISSUE NECK W CONTRAST  Result Date: 04/01/2021 CLINICAL DATA:  Neck mass, abnormal ultrasound EXAM: CT NECK WITH CONTRAST TECHNIQUE: Multidetector CT imaging of the neck was performed using the standard protocol following the bolus administration of intravenous contrast. CONTRAST:  48mL OMNIPAQUE IOHEXOL 300 MG/ML  SOLN COMPARISON:  Correlation made with upper extremity ultrasound 03/31/2021 FINDINGS: Pharynx and larynx: Unremarkable.  No mass or swelling. Salivary glands: Unremarkable. Thyroid: Unremarkable. Lymph nodes: Enlarged right supraclavicular node measuring 2.1 cm (series 2, image 73). No additional enlarged nodes identified. Vascular: There is narrowing of the central right internal jugular vein. No flow within the central left internal jugular vein. Mixed plaque at the right ICA origin. Left subclavian and brachiocephalic vein stenting. No apparent subclavian flow bilaterally. Given patency on recent ultrasound, presumably due to phase of study. Extensive venous collateral formation. Left IJ approach catheter partially imaged. Calcified plaque at the left ICA origin. Likely similar to prior CTA. Limited intracranial: No abnormal enhancement. Visualized orbits: No significant abnormality. Mastoids and visualized paranasal sinuses: Left mastoid and middle ear effusions.  No significant paranasal sinus opacification. Skeleton: No acute abnormality. Upper chest: Mild interlobular septal thickening may reflect edema. Small left pleural effusion with areas of loculation. Chest wall edema. Other: Subcutaneous edema. IMPRESSION: 2 cm enlarged right supraclavicular node. No additional enlarged nodes are identified. Subcutaneous edema, mild interstitial pulmonary edema, and partially loculated small left pleural effusion suggesting volume overload. Venous collateral formation is noted. There is no apparent flow in the subclavian or central internal jugular veins. May reflect timing of study and reduced flow, noting reported patency on ultrasound. Electronically Signed   By: Macy Mis M.D.   On: 04/01/2021 11:06   US Venous Img Lower Bilateral (DVT)  Result Date: 03/17/2021 CLINICAL DATA:  Bilateral leg pain and swelling. EXAM: BILATERAL LOWER EXTREMITY VENOUS DOPPLER ULTRASOUND TECHNIQUE: Gray-scale sonography with compression, as well as color  and duplex ultrasound, were performed to evaluate the deep venous system(s) from the level of the common femoral vein through the popliteal and proximal calf veins. COMPARISON:  None. FINDINGS: VENOUS Normal compressibility of the BILATERAL common femoral, superficial femoral, and popliteal veins, as well as the visualized BILATERAL calf veins. Visualized portions of the BILATERAL profunda femoral veins and BILATERAL great saphenous veins are unremarkable. No filling defects to suggest DVT on grayscale or color Doppler imaging. Doppler waveforms show normal direction of venous flow and normal respiratory plasticity. OTHER Marked severity diffuse bilateral lower extremity edema is seen. Limitations: It should be noted that the patient refused augmentation secondary to pain (as per the ultrasound technologist). IMPRESSION: 1. Limited study, as described above, without evidence of DVT within the RIGHT or LEFT lower extremity. 2. Diffuse  bilateral lower extremity edema. Electronically Signed   By: Virgina Norfolk M.D.   On: 03/17/2021 22:55   US Venous Img Upper Uni Right(DVT)  Result Date: 03/31/2021 CLINICAL DATA:  Right upper extremity over 1 year EXAM: RIGHT UPPER EXTREMITY VENOUS DOPPLER ULTRASOUND TECHNIQUE: Gray-scale sonography with graded compression, as well as color Doppler and duplex ultrasound were performed to evaluate the upper extremity deep venous system from the level of the subclavian vein and including the jugular, axillary, basilic, radial, ulnar and upper cephalic vein. Spectral Doppler was utilized to evaluate flow at rest and with distal augmentation maneuvers. COMPARISON:  08/11/2020 FINDINGS: Contralateral Subclavian Vein: Respiratory phasicity is normal and symmetric with the symptomatic side. No evidence of thrombus. Normal compressibility. Internal Jugular Vein: No evidence of thrombus. Normal compressibility, respiratory phasicity and response to augmentation. Subclavian Vein: No evidence of thrombus. Normal compressibility, respiratory phasicity and response to augmentation. Axillary Vein: No evidence of thrombus. Normal compressibility, respiratory phasicity and response to augmentation. Cephalic Vein: No evidence of thrombus. Normal compressibility, respiratory phasicity and response to augmentation. Basilic Vein: No evidence of thrombus. Normal compressibility, respiratory phasicity and response to augmentation. Brachial Veins: No evidence of thrombus. Normal compressibility, respiratory phasicity and response to augmentation. Radial Veins: No evidence of thrombus. Normal compressibility, respiratory phasicity and response to augmentation. Ulnar Veins: No evidence of thrombus. Normal compressibility, respiratory phasicity and response to augmentation. Other Findings: 2.0 x 1.4 x 1.1 cm ovoid hypoechoic structure in the right supraclavicular region is suspicious for abnormally enlarged lymph node. Thrombosed  right upper extremity graft is noted. In the right medial upper arm there is a 5.1 x 2.1 x 2.1 cm ovoid hypoechoic mass. This may be related to a thrombosed pseudoaneurysm. IMPRESSION: 1. No right upper extremity DVT. 2. Abnormally enlarged lymph node seen in the right supraclavicular region measuring 2.0 x 1.4 x 1.1 cm should be further evaluated with contrast enhanced soft tissue neck CT. 3. 5.1 x 2.1 x 2.1 cm ovoid hypoechoic mass in the right upper arm is most likely a thrombosed pseudoaneurysm related to occluded dialysis graft. Recommend confirmation with contrast enhanced MRI or CT of the right upper arm. Electronically Signed   By: Miachel Roux M.D.   On: 03/31/2021 13:02   DG Chest Port 1 View  Result Date: 03/29/2021 CLINICAL DATA:  Sepsis EXAM: PORTABLE CHEST 1 VIEW COMPARISON:  03/17/2021 FINDINGS: Cardiomegaly with pulmonary vascular congestion. No frank interstitial edema. Mild bibasilar opacities, likely atelectasis. Suspected small left pleural effusion. No pneumothorax. Left IJ dual lumen dialysis catheter terminating in the upper right atrium. Left subclavian stent. IMPRESSION: Cardiomegaly with pulmonary vascular congestion. No frank interstitial edema. Small left pleural effusion. Mild bibasilar  opacities, likely atelectasis. Electronically Signed   By: Julian Hy M.D.   On: 03/29/2021 00:44   DG Chest Port 1 View  Result Date: 03/17/2021 CLINICAL DATA:  Patient leaking fluid from right leg. End-stage renal disease. History of CHF. EXAM: PORTABLE CHEST 1 VIEW COMPARISON:  Feb 14, 2021 FINDINGS: The cardiomediastinal silhouette is stable. Diffuse bilateral interstitial opacities, similar in the interval. The dialysis catheter is stable terminating near the caval atrial junction and in the right atrium. No pneumothorax. No other changes. IMPRESSION: Findings are most consistent with CHF.  Stable left central line. Electronically Signed   By: Dorise Bullion III M.D   On: 03/17/2021  19:41   VAS [place] UPPER EXTREMITY ARTERIAL DUPLEX  Result Date: 03/07/2021  UPPER EXTREMITY DUPLEX STUDY Patient Name:  [name]  Date of Exam:   03/05/2021 Medical Rec #: [id number]    Accession #:    [id number] Date of Birth: April 04, 1961     Patient Gender: F Patient Age:   75Y Exam Location:  [facility] Procedure:      VAS [place] UPPER EXTREMITY ARTERIAL DUPLEX Referring Phys: [id number] [place] --------------------------------------------------------------------------------  History: Patient has a history of upper extremity pain.  Comparison Study: 06/09/2020 Performing Technologist: Charlane Ferretti RT (R)(VS)  Examination Guidelines: A complete evaluation includes B-mode imaging, spectral Doppler, color Doppler, and power Doppler as needed of all accessible portions of each vessel. Bilateral testing is considered an integral part of a complete examination. Limited examinations for reoccurring indications may be performed as noted.  Right Doppler Findings: +---------------+----------+---------+--------+--------+ Site           PSV (cm/s)Waveform StenosisComments +---------------+----------+---------+--------+--------+ Subclavian Prox140       triphasic                 +---------------+----------+---------+--------+--------+ Subclavian Dist          biphasic                  +---------------+----------+---------+--------+--------+ Axillary       103       triphasic                 +---------------+----------+---------+--------+--------+ Brachial Prox  127       triphasic                 +---------------+----------+---------+--------+--------+ Brachial Mid   133       triphasic                 +---------------+----------+---------+--------+--------+ Brachial Dist  -149      triphasic                 +---------------+----------+---------+--------+--------+ Radial Dist    152       biphasic                   +---------------+----------+---------+--------+--------+ Ulnar Dist     104       triphasic                 +---------------+----------+---------+--------+--------+ Summary:  Right: No obstruction visualized in the right upper extremity. *See table(s) above for measurements and observations. Electronically signed by Hortencia Pilar MD on 03/07/2021 at 4:41:55 PM.    Final     Assessment/Plan The patient is a 60 year old female with multiple medical issues including known end-stage renal disease who is maintained via hemodialysis through a PermCath presents with edema to the upper extremity with some discomfort  1.  Upper extremity edema in  the setting of end-stage renal disease with central stenosis: Patient with known central stenosis after undergoing multiple PermCath as well as upper extremity dialysis access insertions, revisions and resections.   Patient also with multiple issues including hypotension complicating the patency of her previous dialysis access. The patient is currently maintained via a PermCath that seems to be functioning appropriately at this time. The patient has an appointment with Dr. Delana Meyer on April 08, 2021 to undergo an arterial duplex to assess the patency of the patient's right brachial artery angioplasty.  Patient would like to keep this appointment is in agreement with the plan.  Based on the study findings may possibly move forward with a right brachial axillary graft creation.  There is no plan for any acute endovascular/open intervention by vascular surgery during this hospitalization. From a vascular standpoint, the patient can be discharged when she is medically stable.  2. ESRD: Patient will continue to dialyze via her permacath and follow-up at her April 08, 2021 appointment to discuss moving forward with a new access creation.  3.  Anemia of chronic disease: Followed by the patient's nephrologist and primary care physician. Asymptomatic at this  time.  Seen and examined with Dr. Delana Meyer.  Sela Hua, PA-C  04/02/2021 2:14 PM  This note was created with Dragon medical transcription system.  Any error is purely unintentional.

## 2021-04-02 NOTE — Telephone Encounter (Signed)
Patient will be keeping her appointment and will contact the office if she needs to cancel

## 2021-04-02 NOTE — Progress Notes (Signed)
I went to see the patient but she had already been discharged.  The plan remains the same there are no changes.  She will follow-up with me as previously arranged so that she can get her arterial duplex of the right arm.  If her brachial artery reconstruction remains widely patent then I will discuss with her the possibility of the right arm brachial axillary AV graft.

## 2021-04-02 NOTE — Consult Note (Deleted)
Error

## 2021-04-02 NOTE — Progress Notes (Signed)
[name] MRN: [id number] DOB: Oct 29, 1960   Cancelled Treatment:    Reason Eval/Treat Not Completed: Other (comment).  Pt resting in bed upon PT arrival.  Pt reporting wanting to do physical therapy but not today because "they" need to do something to fix skin issues on her bottom before she could do anything d/t pain (pt reporting being turned on side every 2 hours d/t skin issues; nurse also reporting this).  TOC notified.  Will re-attempt PT treatment session at a later date/time.  [name], PT 04/02/21, 1:51 PM

## 2021-04-02 NOTE — Progress Notes (Signed)
Patient discharged to home with Home Health, wheeled out of unit on stretcher by Trula Slade from EMS who will take her home.  Medications and discharge instructions reviewed.  All questions answered.  PIV x 1 removed, no bleeding and intact.  Patient verbalized understanding of signs and symptoms of infection.  Patient agreed to follow up with all appointments as listed on AVS.  Patient satisfied with overall care at [facility].

## 2021-04-02 NOTE — Plan of Care (Signed)
End of Shift Summary:  Alert and oriented x4. VSS on room air. IV abx infused without issue. CHG bath completed. (+) BM. Pain managed with prn medications. Denies n/v. Remained free from falls or injuries. Call bell within reach and able to use.    Problem: Clinical Measurements: Goal: Ability to maintain clinical measurements within normal limits will improve Outcome: Progressing Goal: Will remain free from infection Outcome: Progressing Goal: Diagnostic test results will improve Outcome: Progressing Goal: Respiratory complications will improve Outcome: Progressing Goal: Cardiovascular complication will be avoided Outcome: Progressing   Problem: Elimination: Goal: Will not experience complications related to bowel motility Outcome: Progressing Goal: Will not experience complications related to urinary retention Outcome: Progressing   Problem: Pain Managment: Goal: General experience of comfort will improve Outcome: Progressing   Problem: Safety: Goal: Ability to remain free from injury will improve Outcome: Progressing

## 2021-04-03 ENCOUNTER — Telehealth (INDEPENDENT_AMBULATORY_CARE_PROVIDER_SITE_OTHER): Payer: Self-pay

## 2021-04-03 LAB — CULTURE, BLOOD (ROUTINE X 2)
Culture: NO GROWTH
Culture: NO GROWTH
Special Requests: ADEQUATE

## 2021-04-03 NOTE — Telephone Encounter (Signed)
I attempted to contact the patient to schedule a right brachial axillary graft with Dr. Delana Meyer. A message was left for a return call.

## 2021-04-04 NOTE — Telephone Encounter (Signed)
Patient returned my call and is now scheduled with Dr. Delana Meyer for a right brachial axillary graft on 05/01/21 at the [facility]. Pre-op phone call is on 04/23/21 between 1-5 pm. Pre-surgical instructions were discussed and will be mailed.

## 2021-04-05 ENCOUNTER — Telehealth: Payer: Self-pay | Admitting: Primary Care

## 2021-04-05 NOTE — Telephone Encounter (Signed)
Attempted to contact patient to schedule Palliative Consult, left message requesting a return call to let us know if she wanted to pursue Palliative services and even if she didn't, left my name and call back number.  I   also contacted patient's son [name] and spoke with him to let him know that I haven't been able to speak with patient to schedule a visit and he took my number and said that he would try to get in touch with her to let her know to call me back.

## 2021-04-08 ENCOUNTER — Encounter: Admission: RE | Payer: Self-pay | Source: Home / Self Care

## 2021-04-08 ENCOUNTER — Other Ambulatory Visit: Payer: Self-pay

## 2021-04-08 ENCOUNTER — Ambulatory Visit (INDEPENDENT_AMBULATORY_CARE_PROVIDER_SITE_OTHER): Payer: Medicare Other | Admitting: Vascular Surgery

## 2021-04-08 ENCOUNTER — Ambulatory Visit: Admission: RE | Admit: 2021-04-08 | Payer: Medicare Other | Source: Home / Self Care

## 2021-04-08 VITALS — BP 127/62 | HR 71 | Ht 65.0 in | Wt 114.0 lb

## 2021-04-08 DIAGNOSIS — J9611 Chronic respiratory failure with hypoxia: Secondary | ICD-10-CM

## 2021-04-08 DIAGNOSIS — T829XXD Unspecified complication of cardiac and vascular prosthetic device, implant and graft, subsequent encounter: Secondary | ICD-10-CM | POA: Diagnosis not present

## 2021-04-08 DIAGNOSIS — I2510 Atherosclerotic heart disease of native coronary artery without angina pectoris: Secondary | ICD-10-CM | POA: Diagnosis not present

## 2021-04-08 DIAGNOSIS — I5033 Acute on chronic diastolic (congestive) heart failure: Secondary | ICD-10-CM

## 2021-04-08 DIAGNOSIS — N186 End stage renal disease: Secondary | ICD-10-CM

## 2021-04-08 DIAGNOSIS — Z992 Dependence on renal dialysis: Secondary | ICD-10-CM

## 2021-04-08 SURGERY — EGD (ESOPHAGOGASTRODUODENOSCOPY)
Anesthesia: General

## 2021-04-10 ENCOUNTER — Encounter (INDEPENDENT_AMBULATORY_CARE_PROVIDER_SITE_OTHER): Payer: Self-pay | Admitting: Vascular Surgery

## 2021-04-10 NOTE — H&P (View-Only) (Signed)
MRN : [id number]  [name] is a 60 y.o. ([birth year]) female who presents with chief complaint of  Chief Complaint  Patient presents with   Foot Ulcer    4wk  HDA Steel Study  .  History of Present Illness:    The patient is seen for evaluation of dialysis access.  The patient has a history of multiple failed accesses.  There have been accesses in both arms and in the thighs.    Current access is via a catheter which is functioning adequately.  There has been at least one episode of catheter infection.  She recently underwent venography of the right upper extremity.  At that time it appears she does have adequate axillary vein for a graft.  She has moderate subclavian stenosis which may or may not be a problem it is impossible to say given the degree of stricture identified.  Her innominate vein and superior vena cava from the right would appear to be adequate.  Several months ago she also went arteriography of the right upper extremity at that time tandem greater than 90% stenoses were noted in the distal brachial artery.  There is appeared to be associated with a previous brachial axillary graft.  These were treated with angioplasty and follow-up duplex ultrasound just a few weeks ago demonstrated the lesion was well treated with triphasic signals distally.  Lastly, she was recently in the hospital for an infectious process requiring ICU stay.  Since that time she has continued to struggle overall.  She continues to complain of massive swelling in both lower extremities which is extremely painful.  The patient denies recent amaurosis fugax or recent TIA symptoms. There are no recent neurological changes noted.   Current Meds  Medication Sig   albuterol (PROVENTIL) (2.5 MG/3ML) 0.083% nebulizer solution Take 2.5 mg by nebulization every 4 (four) hours as needed for wheezing or shortness of breath.   albuterol (VENTOLIN HFA) 108 (90 Base) MCG/ACT inhaler Inhale 2 puffs into the lungs  every 4 (four) hours as needed for wheezing or shortness of breath.   alum & mag hydroxide-simeth (MAALOX/MYLANTA) 200-200-20 MG/5ML suspension Take 15 mLs by mouth every 6 (six) hours as needed for indigestion or heartburn.   amLODipine (NORVASC) 10 MG tablet Take 1 tablet (10 mg total) by mouth daily.   ammonium lactate (LAC-HYDRIN) 12 % lotion Apply 1 application topically 2 (two) times daily as needed for dry skin.    Ascorbic Acid (VITA-C PO) Take 1 tablet by mouth daily.   aspirin EC 81 MG tablet Take 81 mg by mouth daily.   atorvastatin (LIPITOR) 20 MG tablet Take 20 mg by mouth every evening.    Biotin 1 MG CAPS Take 1 tablet by mouth daily.   calcium carbonate (TUMS - DOSED IN MG ELEMENTAL CALCIUM) 500 MG chewable tablet Chew 2.5 tablets (500 mg of elemental calcium total) by mouth every 6 (six) hours as needed for indigestion.   Cholecalciferol (VITAMIN D3) 25 MCG (1000 UT) CAPS Take 1,000 Units by mouth daily.    cholestyramine light (PREVALITE) 4 g packet Take 4 g by mouth 2 (two) times daily.   CREON 12000 units CPEP capsule Take 36,000 Units by mouth 3 (three) times daily before meals.    cyanocobalamin 1000 MCG tablet Take 1,000 mcg by mouth daily.    diphenhydrAMINE (BENADRYL) 25 MG tablet Take 25 mg by mouth every 6 (six) hours as needed for itching.   epoetin alfa (EPOGEN) 10000 UNIT/ML  injection Inject 1 mL (10,000 Units total) into the vein every Monday, Wednesday, and Friday with hemodialysis.   Ferrous Sulfate (IRON) 325 (65 Fe) MG TABS Take 325 mg by mouth daily.    furosemide (LASIX) 80 MG tablet Take 1 tablet by mouth daily.   gabapentin (NEURONTIN) 100 MG capsule Take 100 mg by mouth every evening.    hydrALAZINE (APRESOLINE) 50 MG tablet Take 1 tablet (50 mg total) by mouth every 8 (eight) hours.   HYDROcodone-acetaminophen (NORCO/VICODIN) 5-325 MG tablet Take 1 tablet by mouth 3 (three) times daily as needed for moderate pain or severe pain.   hydrOXYzine  (ATARAX/VISTARIL) 25 MG tablet Take 25 mg by mouth daily.    lidocaine (LIDODERM) 5 % SMARTSIG:Topical   meclizine (ANTIVERT) 25 MG tablet Take 25 mg by mouth 4 (four) times daily as needed for dizziness.   megestrol (MEGACE) 400 MG/10ML suspension Take 10 mLs (400 mg total) by mouth daily.   multivitamin (RENA-VIT) TABS tablet Take 1 tablet by mouth daily.    mupirocin ointment (BACTROBAN) 2 % Apply 1 application topically daily as needed (leg rash).    nitroGLYCERIN (NITROSTAT) 0.4 MG SL tablet Place 0.4 mg under the tongue every 5 (five) minutes as needed for chest pain.    Nutritional Supplements (FEEDING SUPPLEMENT, NEPRO CARB STEADY,) LIQD Take 237 mLs by mouth 2 (two) times daily between meals.   Omega-3 300 MG CAPS Take 300 mg by mouth 2 (two) times daily. Every other day opposite omega XL   ondansetron (ZOFRAN) 4 MG tablet Take 1 tablet (4 mg total) by mouth every 8 (eight) hours as needed for nausea or vomiting.   pantoprazole (PROTONIX) 40 MG tablet Take 40 mg by mouth daily.   polyethylene glycol (MIRALAX / GLYCOLAX) 17 g packet Take 17 g by mouth daily as needed.   Probiotic Product (PROBIOTIC PO) Take 1 capsule by mouth daily.   sevelamer carbonate (RENVELA) 800 MG tablet Take 800 mg by mouth 4 (four) times daily. (at meals and with a snack)   vitamin E 180 MG (400 UNITS) capsule Take 400 Units by mouth daily.    Past Medical History:  Diagnosis Date   Anemia    Anginal pain (HCC)    Anxiety    Arthritis    Asthma    Broken wrist    Bronchitis    chronic diastolic CHF 1/51/7616   Chronic kidney disease    COPD (chronic obstructive pulmonary disease) ([place])    Coronary artery disease    a. cath 2013: stenting to RCA (report not available); b. cath 2014: LM nl, pLAD 40%, mLAD nl, ost LCx 40%, mid LCx nl, pRCA 30% @ site of prior stent, mRCA 50%   Depression    Diabetes mellitus ([place])    Diabetes mellitus without complication ([place])    Diabetic neuropathy ([place])    dialysis  2006   Diverticulosis    Dizziness    Dyspnea    Elevated lipids    Environmental and seasonal allergies    ESRD (end stage renal disease) on dialysis ([place])    M-W-F   Gastroparesis    GERD (gastroesophageal reflux disease)    Headache    History of anemia due to chronic kidney disease    History of hiatal hernia    HOH (hard of hearing)    Hx of pancreatitis 2015   Hypertension    Lower extremity edema    Mitral regurgitation    a. echo 10/2013:  EF 62%, noWMA, mildly dilated LA, mild to mod MR/TR, GR1DD   Myocardial infarction (HCC)    Orthopnea    Parathyroid abnormality (HCC)    Peripheral arterial disease (HCC)    Pneumonia    Renal cancer ([place])    Renal insufficiency    Pt is on dialysis on M,W + F.   Wheezing     Past Surgical History:  Procedure Laterality Date   A/V SHUNTOGRAM Left 01/20/2018   Procedure: A/V SHUNTOGRAM;  Surgeon: Algernon Huxley, MD;  Location: [place] [facility];  Service: Cardiovascular;  Laterality: Left;   ABDOMINAL HYSTERECTOMY  1992   AMPUTATION TOE Left 10/02/2017   Procedure: AMPUTATION TOE-LEFT GREAT TOE;  Surgeon: Albertine Patricia, DPM;  Location: [facility];  Service: Podiatry;  Laterality: Left;   APPENDECTOMY     APPLICATION OF WOUND VAC N/A 11/25/2019   Procedure: APPLICATION OF WOUND VAC;  Surgeon: Katha Cabal, MD;  Location: [facility];  Service: Vascular;  Laterality: N/A;   ARTERY BIOPSY Right 10/11/2018   Procedure: BIOPSY TEMPORAL ARTERY;  Surgeon: Vickie Epley, MD;  Location: [facility];  Service: General;  Laterality: Right;   CARDIAC CATHETERIZATION Left 07/26/2015   Procedure: Left Heart Cath and Coronary Angiography;  Surgeon: Dionisio David, MD;  Location: [place] [facility];  Service: Cardiovascular;  Laterality: Left;   CATARACT EXTRACTION W/ INTRAOCULAR LENS IMPLANT Right    CATARACT EXTRACTION W/PHACO Left 03/10/2017   Procedure: CATARACT EXTRACTION PHACO AND INTRAOCULAR LENS PLACEMENT ([place]);  Surgeon: Birder Robson, MD;  Location: [facility];  Service: Ophthalmology;  Laterality: Left;  [place] 00:51.9 AP% 14.2 CDE 7.39 fluid pack lot # [id number]   CENTRAL LINE INSERTION Right 11/11/2019   Procedure: CENTRAL LINE INSERTION;  Surgeon: Katha Cabal, MD;  Location: [facility];  Service: Vascular;  Laterality: Right;   CENTRAL LINE INSERTION  11/25/2019   Procedure: CENTRAL LINE INSERTION;  Surgeon: Katha Cabal, MD;  Location: [facility];  Service: Vascular;;   CHOLECYSTECTOMY     COLONOSCOPY WITH PROPOFOL N/A 08/12/2016   Procedure: COLONOSCOPY WITH PROPOFOL;  Surgeon: Lollie Sails, MD;  Location[facility];  Service: Endoscopy;  Laterality: N/A;   DIALYSIS FISTULA CREATION Left    upper arm   dialysis grafts     DIALYSIS/PERMA CATHETER INSERTION N/A 11/14/2019   Procedure: DIALYSIS/PERMA CATHETER INSERTION;  Surgeon: Algernon Huxley, MD;  Location: [place] [facility];  Service: Cardiovascular;  Laterality: N/A;   DIALYSIS/PERMA CATHETER INSERTION N/A 02/03/2020   Procedure: DIALYSIS/PERMA CATHETER INSERTION;  Surgeon: Katha Cabal, MD;  Location: [place] [facility];  Service: Cardiovascular;  Laterality: N/A;   DIALYSIS/PERMA CATHETER INSERTION N/A 06/19/2020   Procedure: DIALYSIS/PERMA CATHETER INSERTION;  Surgeon: Katha Cabal, MD;  Location: [place] [facility];  Service: Cardiovascular;  Laterality: N/A;   DIALYSIS/PERMA CATHETER REMOVAL N/A 05/25/2020   Procedure: DIALYSIS/PERMA CATHETER REMOVAL;  Surgeon: Katha Cabal, MD;  Location: [place] [facility];  Service: Cardiovascular;  Laterality: N/A;   ESOPHAGOGASTRODUODENOSCOPY N/A 03/08/2015   Procedure: ESOPHAGOGASTRODUODENOSCOPY (EGD);  Surgeon: Manya Silvas, MD;  Location: [facility];  Service: Endoscopy;  Laterality: N/A;   ESOPHAGOGASTRODUODENOSCOPY (EGD) WITH PROPOFOL N/A 03/18/2016   Procedure: ESOPHAGOGASTRODUODENOSCOPY (EGD) WITH PROPOFOL;  Surgeon: [name], MD;  Location: [facility];  Service:  Endoscopy;  Laterality: N/A;   EYE SURGERY Right 2018   FECAL TRANSPLANT N/A 08/23/2015   Procedure: FECAL TRANSPLANT;  Surgeon: Manya Silvas, MD;  Location: [facility];  Service: Endoscopy;  Laterality: N/A;   HAND SURGERY Bilateral    HEMATOMA EVACUATION Left 11/25/2019   Procedure: EVACUATION HEMATOMA;  Surgeon: Katha Cabal, MD;  Location: [facility];  Service: Vascular;  Laterality: Left;   I & D EXTREMITY Left 11/25/2019   Procedure: IRRIGATION AND DEBRIDEMENT EXTREMITY;  Surgeon: Katha Cabal, MD;  Location: [facility];  Service: Vascular;  Laterality: Left;   IR FLUORO GUIDE CV LINE RIGHT  04/06/2020   IR INJECT/THERA/INC NEEDLE/CATH/PLC EPI/CERV/THOR W/IMG  08/13/2020   IR RADIOLOGIST EVAL & MGMT  07/28/2019   IR RADIOLOGIST EVAL & MGMT  08/11/2019   LIGATION OF ARTERIOVENOUS  FISTULA Left 11/11/2019   LIGATION OF ARTERIOVENOUS  FISTULA Left 11/11/2019   Procedure: LIGATION OF ARTERIOVENOUS  FISTULA;  Surgeon: Katha Cabal, MD;  Location: [facility];  Service: Vascular;  Laterality: Left;   LIGATIONS OF HERO GRAFT Right 06/13/2020   Procedure: LIGATION / REMOVAL OF RIGHT HERO GRAFT;  Surgeon: Katha Cabal, MD;  Location: [facility];  Service: Vascular;  Laterality: Right;   PERIPHERAL VASCULAR CATHETERIZATION N/A 12/20/2015   Procedure: Thrombectomy of dialysis access versus permcath placement;  Surgeon: Algernon Huxley, MD;  Location: [place] [facility];  Service: Cardiovascular;  Laterality: N/A;   PERIPHERAL VASCULAR CATHETERIZATION N/A 12/20/2015   Procedure: A/V Shunt Intervention;  Surgeon: Algernon Huxley, MD;  Location: [place] [facility];  Service: Cardiovascular;  Laterality: N/A;   PERIPHERAL VASCULAR CATHETERIZATION N/A 12/20/2015   Procedure: A/V Shuntogram/Fistulagram;  Surgeon: Algernon Huxley, MD;  Location: [place] [facility];  Service: Cardiovascular;  Laterality: N/A;   PERIPHERAL VASCULAR CATHETERIZATION N/A 01/02/2016   Procedure: A/V  Shuntogram/Fistulagram;  Surgeon: Algernon Huxley, MD;  Location: [place] [facility];  Service: Cardiovascular;  Laterality: N/A;   PERIPHERAL VASCULAR CATHETERIZATION N/A 01/02/2016   Procedure: A/V Shunt Intervention;  Surgeon: Algernon Huxley, MD;  Location: [place] [facility];  Service: Cardiovascular;  Laterality: N/A;   TEE WITHOUT CARDIOVERSION N/A 06/11/2020   Procedure: TRANSESOPHAGEAL ECHOCARDIOGRAM (TEE);  Surgeon: Teodoro Spray, MD;  Location: [facility];  Service: Cardiovascular;  Laterality: N/A;   UPPER EXTREMITY ANGIOGRAPHY Bilateral 11/27/2020   Procedure: UPPER EXTREMITY ANGIOGRAPHY;  Surgeon: Katha Cabal, MD;  Location: [place] [facility];  Service: Cardiovascular;  Laterality: Bilateral;   UPPER EXTREMITY VENOGRAPHY Right 01/18/2020   Procedure: UPPER EXTREMITY VENOGRAPHY;  Surgeon: Katha Cabal, MD;  Location: [place] [facility];  Service: Cardiovascular;  Laterality: Right;   UPPER EXTREMITY VENOGRAPHY Bilateral 11/27/2020   Procedure: UPPER EXTREMITY VENOGRAPHY;  Surgeon: Katha Cabal, MD;  Location: [place] [facility];  Service: Cardiovascular;  Laterality: Bilateral;   VASCULAR ACCESS DEVICE INSERTION Right 04/13/2020   Procedure: INSERTION OF HERO VASCULAR ACCESS DEVICE (GRAFT);  Surgeon: Katha Cabal, MD;  Location: [facility];  Service: Vascular;  Laterality: Right;    Social History Social History   Tobacco Use   Smoking status: Never   Smokeless tobacco: Never  Vaping Use   Vaping Use: Never used  Substance Use Topics   Alcohol use: Not Currently    Comment: glass wine week per pt   Drug use: Yes    Types: Marijuana    Comment: once a day    Family History Family History  Problem Relation Age of Onset   Kidney disease Mother    Diabetes Mother    Cancer Father    Kidney disease Sister  No family history of bleeding/clotting disorders, porphyria or autoimmune disease   Allergies  Allergen Reactions   Ace Inhibitors Swelling and  Anaphylaxis   Ativan [Lorazepam] Other (See Comments)    Reaction:  Hallucinations and headaches   Compazine [Prochlorperazine Edisylate] Anaphylaxis, Nausea And Vomiting and Other (See Comments)    Other reaction(s): dystonia from this vs. Reglan, 23 Jul - patient relates that she takes promethazine frequently with no problems   Sumatriptan Succinate Other (See Comments)    Other reaction(s): delirium and hallucinations per [facility] records   Zofran [Ondansetron] Nausea And Vomiting    Per pt. she is allergic to zofran or will experience adverse reaction like hallucination    Losartan Nausea Only   Prochlorperazine Other (See Comments)    Reaction:  Unknown . Patient does not remember reaction but she does have vertigo and anxiety along with n and v at times. Could be used to treat any of these    Reglan [Metoclopramide] Other (See Comments)    Per patient her Dr. Evelina Bucy her off it    Scopolamine Other (See Comments)    Dizziness, also has vertigo already   Tape Rash    Plastic tape causes rash   Tapentadol Rash   Ultrasound Gel Itching    Patient states the Ultrasound gel caused itching while in skin contact and resolved when wiped away.     REVIEW OF SYSTEMS (Negative unless checked)  Constitutional: [] Weight loss  [] Fever  [] Chills Cardiac: [] Chest pain   [] Chest pressure   [] Palpitations   [x] Shortness of breath when laying flat   [x] Shortness of breath with exertion. Vascular:  [] Pain in legs with walking   [] Pain in legs at rest  [] History of DVT   [] Phlebitis   [x] Swelling in legs   [] Varicose veins   [] Non-healing ulcers Pulmonary:   [x] Uses home oxygen   [] Productive cough   [] Hemoptysis   [] Wheeze  [x] COPD   [] Asthma Neurologic:  [] Dizziness   [] Seizures   [] History of stroke   [] History of TIA  [] Aphasia   [] Vissual changes   [] Weakness or numbness in arm   [x] Weakness or numbness in leg Musculoskeletal:   [] Joint swelling   [] Joint pain   [] Low back pain Hematologic:  [] Easy  bruising  [] Easy bleeding   [] Hypercoagulable state   [] Anemic Gastrointestinal:  [] Diarrhea   [] Vomiting  [] Gastroesophageal reflux/heartburn   [] Difficulty swallowing. Genitourinary:  [] Chronic kidney disease   [] Difficult urination  [] Frequent urination   [] Blood in urine Skin:  [] Rashes   [] Ulcers  Psychological:  [] History of anxiety   []  History of major depression.  Physical Examination  Vitals:   04/08/21 1426  BP: 127/62  Pulse: 71  Weight: 114 lb (51.7 kg)  Height: 5\' 5"  (1.651 m)   Body mass index is 18.97 kg/m. Gen: WD/weak, moderate distress at rest Head: [place]/AT, marked temporalis wasting.  Ear/Nose/Throat: Hearing grossly intact, nares w/o erythema or drainage, poor dentition Eyes: PER, EOMI, sclera nonicteric.  Neck: Supple, no masses.  No bruit or JVD.  Pulmonary: On oxygen by nasal cannula poor air movement, coarse rhonchi to auscultation bilaterally, positive use of accessory muscles.  Cardiac: RRR, normal S1, S2, no Murmurs. Vascular:   Profound edema of both lower extremities extending all the way to the groin.  There are dimpling changes to the skin to the mid thigh Vessel Right Left  Radial Trace palpable Not palpable  Ulnar Not palpable Not palpable  Brachial Palpable Palpable  Gastrointestinal: soft, non-distended.  No guarding/no peritoneal signs.  Musculoskeletal: M/S 4/5 throughout.  No deformity or atrophy.  Neurologic: CN 2-12 intact. Pain and light touch intact in extremities.  Symmetrical.  Speech is fluent. Motor exam as listed above. Psychiatric: Judgment intact, Mood & affect appropriate for pt's clinical situation. Dermatologic: Profound venous rashes no ulcers noted.  No changes consistent with cellulitis. Lymph : Profound lichenification / skin changes of chronic lymphedema.  CBC Lab Results  Component Value Date   WBC 8.0 04/01/2021   HGB 9.7 (L) 04/01/2021   HCT 28.6 (L) 04/01/2021   MCV 81.9 04/01/2021   PLT 100 (L) 04/01/2021     BMET    Component Value Date/Time   NA 129 (L) 04/01/2021 0829   NA 135 (L) 09/15/2014 0948   K 4.0 04/01/2021 0829   K 4.8 09/15/2014 0948   CL 94 (L) 04/01/2021 0829   CL 99 09/15/2014 0948   CO2 26 04/01/2021 0829   CO2 26 09/15/2014 0948   GLUCOSE 135 (H) 04/01/2021 0829   GLUCOSE 118 (H) 09/15/2014 0948   BUN 43 (H) 04/01/2021 0829   BUN 19 (H) 09/15/2014 0948   CREATININE 4.95 (H) 04/01/2021 0829   CREATININE 6.79 (H) 09/15/2014 0948   CALCIUM 7.6 (L) 04/01/2021 0829   CALCIUM 8.3 (L) 09/15/2014 0948   GFRNONAA 9 (L) 04/01/2021 0829   GFRNONAA 7 (L) 09/15/2014 0948   GFRNONAA 6 (L) 05/31/2014 0432   GFRAA 11 (L) 06/25/2020 0641   GFRAA 8 (L) 09/15/2014 0948   GFRAA 7 (L) 05/31/2014 0432   Estimated Creatinine Clearance: 9.7 mL/min (A) (by C-G formula based on SCr of 4.95 mg/dL (H)).  COAG Lab Results  Component Value Date   INR 1.3 (H) 03/29/2021   INR 1.3 (H) 03/17/2021   INR 1.3 (H) 02/14/2021    Radiology DG Shoulder Right  Result Date: 03/30/2021 CLINICAL DATA:  Right shoulder pain EXAM: RIGHT SHOULDER - 2+ VIEW COMPARISON:  None. FINDINGS: No acute fracture or dislocation is noted. No soft tissue abnormality is seen. Dialysis catheter is noted from the left as well as stenting in the right arm and left innominate vein. Mild degenerative changes of the glenohumeral joint and acromioclavicular joint are seen. Bony thorax appears within normal limits. IMPRESSION: No acute abnormality noted.  Degenerative changes are seen. Electronically Signed   By: Inez Catalina M.D.   On: 03/30/2021 19:26   CT Head Wo Contrast  Result Date: 03/29/2021 CLINICAL DATA:  Altered mental status EXAM: CT HEAD WITHOUT CONTRAST TECHNIQUE: Contiguous axial images were obtained from the base of the skull through the vertex without intravenous contrast. COMPARISON:  09/17/2020 FINDINGS: Brain: No evidence of acute infarction, hemorrhage, hydrocephalus, extra-axial collection or mass  lesion/mass effect. Vascular: No hyperdense vessel or unexpected calcification. Skull: Normal. Negative for fracture or focal lesion. Sinuses/Orbits: The visualized paranasal sinuses are essentially clear. The mastoid air cells are unopacified. Other: None. IMPRESSION: Normal head CT. Electronically Signed   By: Julian Hy M.D.   On: 03/29/2021 01:12   CT SOFT TISSUE NECK W CONTRAST  Result Date: 04/01/2021 CLINICAL DATA:  Neck mass, abnormal ultrasound EXAM: CT NECK WITH CONTRAST TECHNIQUE: Multidetector CT imaging of the neck was performed using the standard protocol following the bolus administration of intravenous contrast. CONTRAST:  71mL OMNIPAQUE IOHEXOL 300 MG/ML  SOLN COMPARISON:  Correlation made with upper extremity ultrasound 03/31/2021 FINDINGS: Pharynx and larynx: Unremarkable.  No mass or swelling. Salivary glands: Unremarkable. Thyroid: Unremarkable. Lymph nodes: Enlarged right  supraclavicular node measuring 2.1 cm (series 2, image 73). No additional enlarged nodes identified. Vascular: There is narrowing of the central right internal jugular vein. No flow within the central left internal jugular vein. Mixed plaque at the right ICA origin. Left subclavian and brachiocephalic vein stenting. No apparent subclavian flow bilaterally. Given patency on recent ultrasound, presumably due to phase of study. Extensive venous collateral formation. Left IJ approach catheter partially imaged. Calcified plaque at the left ICA origin. Likely similar to prior CTA. Limited intracranial: No abnormal enhancement. Visualized orbits: No significant abnormality. Mastoids and visualized paranasal sinuses: Left mastoid and middle ear effusions. No significant paranasal sinus opacification. Skeleton: No acute abnormality. Upper chest: Mild interlobular septal thickening may reflect edema. Small left pleural effusion with areas of loculation. Chest wall edema. Other: Subcutaneous edema. IMPRESSION: 2 cm enlarged  right supraclavicular node. No additional enlarged nodes are identified. Subcutaneous edema, mild interstitial pulmonary edema, and partially loculated small left pleural effusion suggesting volume overload. Venous collateral formation is noted. There is no apparent flow in the subclavian or central internal jugular veins. May reflect timing of study and reduced flow, noting reported patency on ultrasound. Electronically Signed   By: Macy Mis M.D.   On: 04/01/2021 11:06   US Venous Img Lower Bilateral (DVT)  Result Date: 03/17/2021 CLINICAL DATA:  Bilateral leg pain and swelling. EXAM: BILATERAL LOWER EXTREMITY VENOUS DOPPLER ULTRASOUND TECHNIQUE: Gray-scale sonography with compression, as well as color and duplex ultrasound, were performed to evaluate the deep venous system(s) from the level of the common femoral vein through the popliteal and proximal calf veins. COMPARISON:  None. FINDINGS: VENOUS Normal compressibility of the BILATERAL common femoral, superficial femoral, and popliteal veins, as well as the visualized BILATERAL calf veins. Visualized portions of the BILATERAL profunda femoral veins and BILATERAL great saphenous veins are unremarkable. No filling defects to suggest DVT on grayscale or color Doppler imaging. Doppler waveforms show normal direction of venous flow and normal respiratory plasticity. OTHER Marked severity diffuse bilateral lower extremity edema is seen. Limitations: It should be noted that the patient refused augmentation secondary to pain (as per the ultrasound technologist). IMPRESSION: 1. Limited study, as described above, without evidence of DVT within the RIGHT or LEFT lower extremity. 2. Diffuse bilateral lower extremity edema. Electronically Signed   By: Virgina Norfolk M.D.   On: 03/17/2021 22:55   US Venous Img Upper Uni Right(DVT)  Result Date: 03/31/2021 CLINICAL DATA:  Right upper extremity over 1 year EXAM: RIGHT UPPER EXTREMITY VENOUS DOPPLER ULTRASOUND  TECHNIQUE: Gray-scale sonography with graded compression, as well as color Doppler and duplex ultrasound were performed to evaluate the upper extremity deep venous system from the level of the subclavian vein and including the jugular, axillary, basilic, radial, ulnar and upper cephalic vein. Spectral Doppler was utilized to evaluate flow at rest and with distal augmentation maneuvers. COMPARISON:  08/11/2020 FINDINGS: Contralateral Subclavian Vein: Respiratory phasicity is normal and symmetric with the symptomatic side. No evidence of thrombus. Normal compressibility. Internal Jugular Vein: No evidence of thrombus. Normal compressibility, respiratory phasicity and response to augmentation. Subclavian Vein: No evidence of thrombus. Normal compressibility, respiratory phasicity and response to augmentation. Axillary Vein: No evidence of thrombus. Normal compressibility, respiratory phasicity and response to augmentation. Cephalic Vein: No evidence of thrombus. Normal compressibility, respiratory phasicity and response to augmentation. Basilic Vein: No evidence of thrombus. Normal compressibility, respiratory phasicity and response to augmentation. Brachial Veins: No evidence of thrombus. Normal compressibility, respiratory phasicity and response to augmentation. Radial  Veins: No evidence of thrombus. Normal compressibility, respiratory phasicity and response to augmentation. Ulnar Veins: No evidence of thrombus. Normal compressibility, respiratory phasicity and response to augmentation. Other Findings: 2.0 x 1.4 x 1.1 cm ovoid hypoechoic structure in the right supraclavicular region is suspicious for abnormally enlarged lymph node. Thrombosed right upper extremity graft is noted. In the right medial upper arm there is a 5.1 x 2.1 x 2.1 cm ovoid hypoechoic mass. This may be related to a thrombosed pseudoaneurysm. IMPRESSION: 1. No right upper extremity DVT. 2. Abnormally enlarged lymph node seen in the right  supraclavicular region measuring 2.0 x 1.4 x 1.1 cm should be further evaluated with contrast enhanced soft tissue neck CT. 3. 5.1 x 2.1 x 2.1 cm ovoid hypoechoic mass in the right upper arm is most likely a thrombosed pseudoaneurysm related to occluded dialysis graft. Recommend confirmation with contrast enhanced MRI or CT of the right upper arm. Electronically Signed   By: Miachel Roux M.D.   On: 03/31/2021 13:02   DG Chest Port 1 View  Result Date: 03/29/2021 CLINICAL DATA:  Sepsis EXAM: PORTABLE CHEST 1 VIEW COMPARISON:  03/17/2021 FINDINGS: Cardiomegaly with pulmonary vascular congestion. No frank interstitial edema. Mild bibasilar opacities, likely atelectasis. Suspected small left pleural effusion. No pneumothorax. Left IJ dual lumen dialysis catheter terminating in the upper right atrium. Left subclavian stent. IMPRESSION: Cardiomegaly with pulmonary vascular congestion. No frank interstitial edema. Small left pleural effusion. Mild bibasilar opacities, likely atelectasis. Electronically Signed   By: Julian Hy M.D.   On: 03/29/2021 00:44   DG Chest Port 1 View  Result Date: 03/17/2021 CLINICAL DATA:  Patient leaking fluid from right leg. End-stage renal disease. History of CHF. EXAM: PORTABLE CHEST 1 VIEW COMPARISON:  Feb 14, 2021 FINDINGS: The cardiomediastinal silhouette is stable. Diffuse bilateral interstitial opacities, similar in the interval. The dialysis catheter is stable terminating near the caval atrial junction and in the right atrium. No pneumothorax. No other changes. IMPRESSION: Findings are most consistent with CHF.  Stable left central line. Electronically Signed   By: Dorise Bullion III M.D   On: 03/17/2021 19:41    Assessment/Plan 1. ESRD on hemodialysis ([place]) Recommend:  At this time the patient does not have appropriate extremity access for dialysis  Patient is currently scheduled for a right brachial axillary AV graft..  However, have great reservations regarding  moving forward given her current condition.  This will be a redo brachial axillary graft.  Of note she is already complaining of chronic pain in the right arm.  I believe this will be the third access in the upper arm.  In the past she has had to have her access immediately ligated secondary to steal.  This could have been because of the lesions identified a few months ago which appear to be well treated but that remains to be seen.  At this point from a vascular point of view we have very few options and all other things aside creation of a right brachial axillary AV graft would be the proper thing to do but I have made it clear that this may not work that we may encounter steal.  In the past with anesthesia she becomes profoundly hypotensive this is true also on dialysis and she has not been able to keep grafts open as they continuously clot.  My greatest concern however is the rather dramatic deconditioning poor pulmonary status and significant coronary disease.  At the present time her catheter appears to be functioning.  I certainly concede that getting a true extremity access would be a significant improvement and clearly there are issues with her fluid status given the rather dramatic swelling and edema of her lower extremities.  In fact she complains more about pain in her lower extremities than anything else.  I will contact nephrology and cardiology to see if they have any input and really to see if she is even a surgical candidate.  FYI because this is a redo redo she will most certainly need a general anesthesia  The risks, benefits and alternative therapies were reviewed in detail with the patient.  All questions were answered.  The patient agrees to consider to proceed with surgery.    2. Complication of arteriovenous dialysis fistula, subsequent encounter See #1  3. Coronary artery disease involving native coronary artery of native heart, unspecified whether angina present Continue cardiac  and antihypertensive medications as already ordered and reviewed, no changes at this time.  Continue statin as ordered and reviewed, no changes at this time  Nitrates PRN for chest pain   4. Chronic respiratory failure with hypoxia (HCC) Continue pulmonary medications and aerosols as already ordered, these medications have been reviewed and there are no changes at this time.    5. Acute on chronic diastolic CHF (congestive heart failure) (HCC) Continue cardiac and antihypertensive medications as already ordered and reviewed, no changes at this time.  Continue statin as ordered and reviewed, no changes at this time  Nitrates PRN for chest pain     Hortencia Pilar, MD  04/10/2021 8:23 AM

## 2021-04-10 NOTE — Progress Notes (Signed)
MRN : [id number]  [name] is a 60 y.o. ([birth year]) female who presents with chief complaint of  Chief Complaint  Patient presents with   Foot Ulcer    4wk  HDA Steel Study  .  History of Present Illness:    The patient is seen for evaluation of dialysis access.  The patient has a history of multiple failed accesses.  There have been accesses in both arms and in the thighs.    Current access is via a catheter which is functioning adequately.  There has been at least one episode of catheter infection.  She recently underwent venography of the right upper extremity.  At that time it appears she does have adequate axillary vein for a graft.  She has moderate subclavian stenosis which may or may not be a problem it is impossible to say given the degree of stricture identified.  Her innominate vein and superior vena cava from the right would appear to be adequate.  Several months ago she also went arteriography of the right upper extremity at that time tandem greater than 90% stenoses were noted in the distal brachial artery.  There is appeared to be associated with a previous brachial axillary graft.  These were treated with angioplasty and follow-up duplex ultrasound just a few weeks ago demonstrated the lesion was well treated with triphasic signals distally.  Lastly, she was recently in the hospital for an infectious process requiring ICU stay.  Since that time she has continued to struggle overall.  She continues to complain of massive swelling in both lower extremities which is extremely painful.  The patient denies recent amaurosis fugax or recent TIA symptoms. There are no recent neurological changes noted.   Current Meds  Medication Sig   albuterol (PROVENTIL) (2.5 MG/3ML) 0.083% nebulizer solution Take 2.5 mg by nebulization every 4 (four) hours as needed for wheezing or shortness of breath.   albuterol (VENTOLIN HFA) 108 (90 Base) MCG/ACT inhaler Inhale 2 puffs into the lungs  every 4 (four) hours as needed for wheezing or shortness of breath.   alum & mag hydroxide-simeth (MAALOX/MYLANTA) 200-200-20 MG/5ML suspension Take 15 mLs by mouth every 6 (six) hours as needed for indigestion or heartburn.   amLODipine (NORVASC) 10 MG tablet Take 1 tablet (10 mg total) by mouth daily.   ammonium lactate (LAC-HYDRIN) 12 % lotion Apply 1 application topically 2 (two) times daily as needed for dry skin.    Ascorbic Acid (VITA-C PO) Take 1 tablet by mouth daily.   aspirin EC 81 MG tablet Take 81 mg by mouth daily.   atorvastatin (LIPITOR) 20 MG tablet Take 20 mg by mouth every evening.    Biotin 1 MG CAPS Take 1 tablet by mouth daily.   calcium carbonate (TUMS - DOSED IN MG ELEMENTAL CALCIUM) 500 MG chewable tablet Chew 2.5 tablets (500 mg of elemental calcium total) by mouth every 6 (six) hours as needed for indigestion.   Cholecalciferol (VITAMIN D3) 25 MCG (1000 UT) CAPS Take 1,000 Units by mouth daily.    cholestyramine light (PREVALITE) 4 g packet Take 4 g by mouth 2 (two) times daily.   CREON 12000 units CPEP capsule Take 36,000 Units by mouth 3 (three) times daily before meals.    cyanocobalamin 1000 MCG tablet Take 1,000 mcg by mouth daily.    diphenhydrAMINE (BENADRYL) 25 MG tablet Take 25 mg by mouth every 6 (six) hours as needed for itching.   epoetin alfa (EPOGEN) 10000 UNIT/ML  injection Inject 1 mL (10,000 Units total) into the vein every Monday, Wednesday, and Friday with hemodialysis.   Ferrous Sulfate (IRON) 325 (65 Fe) MG TABS Take 325 mg by mouth daily.    furosemide (LASIX) 80 MG tablet Take 1 tablet by mouth daily.   gabapentin (NEURONTIN) 100 MG capsule Take 100 mg by mouth every evening.    hydrALAZINE (APRESOLINE) 50 MG tablet Take 1 tablet (50 mg total) by mouth every 8 (eight) hours.   HYDROcodone-acetaminophen (NORCO/VICODIN) 5-325 MG tablet Take 1 tablet by mouth 3 (three) times daily as needed for moderate pain or severe pain.   hydrOXYzine  (ATARAX/VISTARIL) 25 MG tablet Take 25 mg by mouth daily.    lidocaine (LIDODERM) 5 % SMARTSIG:Topical   meclizine (ANTIVERT) 25 MG tablet Take 25 mg by mouth 4 (four) times daily as needed for dizziness.   megestrol (MEGACE) 400 MG/10ML suspension Take 10 mLs (400 mg total) by mouth daily.   multivitamin (RENA-VIT) TABS tablet Take 1 tablet by mouth daily.    mupirocin ointment (BACTROBAN) 2 % Apply 1 application topically daily as needed (leg rash).    nitroGLYCERIN (NITROSTAT) 0.4 MG SL tablet Place 0.4 mg under the tongue every 5 (five) minutes as needed for chest pain.    Nutritional Supplements (FEEDING SUPPLEMENT, NEPRO CARB STEADY,) LIQD Take 237 mLs by mouth 2 (two) times daily between meals.   Omega-3 300 MG CAPS Take 300 mg by mouth 2 (two) times daily. Every other day opposite omega XL   ondansetron (ZOFRAN) 4 MG tablet Take 1 tablet (4 mg total) by mouth every 8 (eight) hours as needed for nausea or vomiting.   pantoprazole (PROTONIX) 40 MG tablet Take 40 mg by mouth daily.   polyethylene glycol (MIRALAX / GLYCOLAX) 17 g packet Take 17 g by mouth daily as needed.   Probiotic Product (PROBIOTIC PO) Take 1 capsule by mouth daily.   sevelamer carbonate (RENVELA) 800 MG tablet Take 800 mg by mouth 4 (four) times daily. (at meals and with a snack)   vitamin E 180 MG (400 UNITS) capsule Take 400 Units by mouth daily.    Past Medical History:  Diagnosis Date   Anemia    Anginal pain (HCC)    Anxiety    Arthritis    Asthma    Broken wrist    Bronchitis    chronic diastolic CHF 11/20/6008   Chronic kidney disease    COPD (chronic obstructive pulmonary disease) ([place])    Coronary artery disease    a. cath 2013: stenting to RCA (report not available); b. cath 2014: LM nl, pLAD 40%, mLAD nl, ost LCx 40%, mid LCx nl, pRCA 30% @ site of prior stent, mRCA 50%   Depression    Diabetes mellitus ([place])    Diabetes mellitus without complication ([place])    Diabetic neuropathy ([place])    dialysis  2006   Diverticulosis    Dizziness    Dyspnea    Elevated lipids    Environmental and seasonal allergies    ESRD (end stage renal disease) on dialysis ([place])    M-W-F   Gastroparesis    GERD (gastroesophageal reflux disease)    Headache    History of anemia due to chronic kidney disease    History of hiatal hernia    HOH (hard of hearing)    Hx of pancreatitis 2015   Hypertension    Lower extremity edema    Mitral regurgitation    a. echo 10/2013:  EF 62%, noWMA, mildly dilated LA, mild to mod MR/TR, GR1DD   Myocardial infarction (HCC)    Orthopnea    Parathyroid abnormality (HCC)    Peripheral arterial disease (HCC)    Pneumonia    Renal cancer ([place])    Renal insufficiency    Pt is on dialysis on M,W + F.   Wheezing     Past Surgical History:  Procedure Laterality Date   A/V SHUNTOGRAM Left 01/20/2018   Procedure: A/V SHUNTOGRAM;  Surgeon: Algernon Huxley, MD;  Location: [place] [facility];  Service: Cardiovascular;  Laterality: Left;   ABDOMINAL HYSTERECTOMY  1992   AMPUTATION TOE Left 10/02/2017   Procedure: AMPUTATION TOE-LEFT GREAT TOE;  Surgeon: Albertine Patricia, DPM;  Location: [facility];  Service: Podiatry;  Laterality: Left;   APPENDECTOMY     APPLICATION OF WOUND VAC N/A 11/25/2019   Procedure: APPLICATION OF WOUND VAC;  Surgeon: Katha Cabal, MD;  Location: [facility];  Service: Vascular;  Laterality: N/A;   ARTERY BIOPSY Right 10/11/2018   Procedure: BIOPSY TEMPORAL ARTERY;  Surgeon: Vickie Epley, MD;  Location: [facility];  Service: General;  Laterality: Right;   CARDIAC CATHETERIZATION Left 07/26/2015   Procedure: Left Heart Cath and Coronary Angiography;  Surgeon: Dionisio David, MD;  Location: [place] [facility];  Service: Cardiovascular;  Laterality: Left;   CATARACT EXTRACTION W/ INTRAOCULAR LENS IMPLANT Right    CATARACT EXTRACTION W/PHACO Left 03/10/2017   Procedure: CATARACT EXTRACTION PHACO AND INTRAOCULAR LENS PLACEMENT ([place]);  Surgeon: Birder Robson, MD;  Location: [facility];  Service: Ophthalmology;  Laterality: Left;  [place] 00:51.9 AP% 14.2 CDE 7.39 fluid pack lot # [id number]   CENTRAL LINE INSERTION Right 11/11/2019   Procedure: CENTRAL LINE INSERTION;  Surgeon: Katha Cabal, MD;  Location: [facility];  Service: Vascular;  Laterality: Right;   CENTRAL LINE INSERTION  11/25/2019   Procedure: CENTRAL LINE INSERTION;  Surgeon: Katha Cabal, MD;  Location: [facility];  Service: Vascular;;   CHOLECYSTECTOMY     COLONOSCOPY WITH PROPOFOL N/A 08/12/2016   Procedure: COLONOSCOPY WITH PROPOFOL;  Surgeon: Lollie Sails, MD;  Location: [facility];  Service: Endoscopy;  Laterality: N/A;   DIALYSIS FISTULA CREATION Left    upper arm   dialysis grafts     DIALYSIS/PERMA CATHETER INSERTION N/A 11/14/2019   Procedure: DIALYSIS/PERMA CATHETER INSERTION;  Surgeon: Algernon Huxley, MD;  Location: [place] [facility];  Service: Cardiovascular;  Laterality: N/A;   DIALYSIS/PERMA CATHETER INSERTION N/A 02/03/2020   Procedure: DIALYSIS/PERMA CATHETER INSERTION;  Surgeon: Katha Cabal, MD;  Location: [place] [facility];  Service: Cardiovascular;  Laterality: N/A;   DIALYSIS/PERMA CATHETER INSERTION N/A 06/19/2020   Procedure: DIALYSIS/PERMA CATHETER INSERTION;  Surgeon: Katha Cabal, MD;  Location: [place] [facility];  Service: Cardiovascular;  Laterality: N/A;   DIALYSIS/PERMA CATHETER REMOVAL N/A 05/25/2020   Procedure: DIALYSIS/PERMA CATHETER REMOVAL;  Surgeon: Katha Cabal, MD;  Location: [place] [facility];  Service: Cardiovascular;  Laterality: N/A;   ESOPHAGOGASTRODUODENOSCOPY N/A 03/08/2015   Procedure: ESOPHAGOGASTRODUODENOSCOPY (EGD);  Surgeon: Manya Silvas, MD;  Location: [facility];  Service: Endoscopy;  Laterality: N/A;   ESOPHAGOGASTRODUODENOSCOPY (EGD) WITH PROPOFOL N/A 03/18/2016   Procedure: ESOPHAGOGASTRODUODENOSCOPY (EGD) WITH PROPOFOL;  Surgeon: [name], MD;  Location: [facility];  Service:  Endoscopy;  Laterality: N/A;   EYE SURGERY Right 2018   FECAL TRANSPLANT N/A 08/23/2015   Procedure: FECAL TRANSPLANT;  Surgeon: Manya Silvas, MD;  Location: [facility];  Service: Endoscopy;  Laterality: N/A;   HAND SURGERY Bilateral    HEMATOMA EVACUATION Left 11/25/2019   Procedure: EVACUATION HEMATOMA;  Surgeon: Katha Cabal, MD;  Location: [facility];  Service: Vascular;  Laterality: Left;   I & D EXTREMITY Left 11/25/2019   Procedure: IRRIGATION AND DEBRIDEMENT EXTREMITY;  Surgeon: Katha Cabal, MD;  Location: [facility];  Service: Vascular;  Laterality: Left;   IR FLUORO GUIDE CV LINE RIGHT  04/06/2020   IR INJECT/THERA/INC NEEDLE/CATH/PLC EPI/CERV/THOR W/IMG  08/13/2020   IR RADIOLOGIST EVAL & MGMT  07/28/2019   IR RADIOLOGIST EVAL & MGMT  08/11/2019   LIGATION OF ARTERIOVENOUS  FISTULA Left 11/11/2019   LIGATION OF ARTERIOVENOUS  FISTULA Left 11/11/2019   Procedure: LIGATION OF ARTERIOVENOUS  FISTULA;  Surgeon: Katha Cabal, MD;  Location: [facility];  Service: Vascular;  Laterality: Left;   LIGATIONS OF HERO GRAFT Right 06/13/2020   Procedure: LIGATION / REMOVAL OF RIGHT HERO GRAFT;  Surgeon: Katha Cabal, MD;  Location: [facility];  Service: Vascular;  Laterality: Right;   PERIPHERAL VASCULAR CATHETERIZATION N/A 12/20/2015   Procedure: Thrombectomy of dialysis access versus permcath placement;  Surgeon: Algernon Huxley, MD;  Location: [place] [facility];  Service: Cardiovascular;  Laterality: N/A;   PERIPHERAL VASCULAR CATHETERIZATION N/A 12/20/2015   Procedure: A/V Shunt Intervention;  Surgeon: Algernon Huxley, MD;  Location: [place] [facility];  Service: Cardiovascular;  Laterality: N/A;   PERIPHERAL VASCULAR CATHETERIZATION N/A 12/20/2015   Procedure: A/V Shuntogram/Fistulagram;  Surgeon: Algernon Huxley, MD;  Location: [place] [facility];  Service: Cardiovascular;  Laterality: N/A;   PERIPHERAL VASCULAR CATHETERIZATION N/A 01/02/2016   Procedure: A/V  Shuntogram/Fistulagram;  Surgeon: Algernon Huxley, MD;  Location: [place] [facility];  Service: Cardiovascular;  Laterality: N/A;   PERIPHERAL VASCULAR CATHETERIZATION N/A 01/02/2016   Procedure: A/V Shunt Intervention;  Surgeon: Algernon Huxley, MD;  Location: [place] [facility];  Service: Cardiovascular;  Laterality: N/A;   TEE WITHOUT CARDIOVERSION N/A 06/11/2020   Procedure: TRANSESOPHAGEAL ECHOCARDIOGRAM (TEE);  Surgeon: Teodoro Spray, MD;  Location: [facility];  Service: Cardiovascular;  Laterality: N/A;   UPPER EXTREMITY ANGIOGRAPHY Bilateral 11/27/2020   Procedure: UPPER EXTREMITY ANGIOGRAPHY;  Surgeon: Katha Cabal, MD;  Location: [place] [facility];  Service: Cardiovascular;  Laterality: Bilateral;   UPPER EXTREMITY VENOGRAPHY Right 01/18/2020   Procedure: UPPER EXTREMITY VENOGRAPHY;  Surgeon: Katha Cabal, MD;  Location: [place] [facility];  Service: Cardiovascular;  Laterality: Right;   UPPER EXTREMITY VENOGRAPHY Bilateral 11/27/2020   Procedure: UPPER EXTREMITY VENOGRAPHY;  Surgeon: Katha Cabal, MD;  Location: [place] [facility];  Service: Cardiovascular;  Laterality: Bilateral;   VASCULAR ACCESS DEVICE INSERTION Right 04/13/2020   Procedure: INSERTION OF HERO VASCULAR ACCESS DEVICE (GRAFT);  Surgeon: Katha Cabal, MD;  Location: [facility];  Service: Vascular;  Laterality: Right;    Social History Social History   Tobacco Use   Smoking status: Never   Smokeless tobacco: Never  Vaping Use   Vaping Use: Never used  Substance Use Topics   Alcohol use: Not Currently    Comment: glass wine week per pt   Drug use: Yes    Types: Marijuana    Comment: once a day    Family History Family History  Problem Relation Age of Onset   Kidney disease Mother    Diabetes Mother    Cancer Father    Kidney disease Sister  No family history of bleeding/clotting disorders, porphyria or autoimmune disease   Allergies  Allergen Reactions   Ace Inhibitors Swelling and  Anaphylaxis   Ativan [Lorazepam] Other (See Comments)    Reaction:  Hallucinations and headaches   Compazine [Prochlorperazine Edisylate] Anaphylaxis, Nausea And Vomiting and Other (See Comments)    Other reaction(s): dystonia from this vs. Reglan, 23 Jul - patient relates that she takes promethazine frequently with no problems   Sumatriptan Succinate Other (See Comments)    Other reaction(s): delirium and hallucinations per [facility] records   Zofran [Ondansetron] Nausea And Vomiting    Per pt. she is allergic to zofran or will experience adverse reaction like hallucination    Losartan Nausea Only   Prochlorperazine Other (See Comments)    Reaction:  Unknown . Patient does not remember reaction but she does have vertigo and anxiety along with n and v at times. Could be used to treat any of these    Reglan [Metoclopramide] Other (See Comments)    Per patient her Dr. Evelina Bucy her off it    Scopolamine Other (See Comments)    Dizziness, also has vertigo already   Tape Rash    Plastic tape causes rash   Tapentadol Rash   Ultrasound Gel Itching    Patient states the Ultrasound gel caused itching while in skin contact and resolved when wiped away.     REVIEW OF SYSTEMS (Negative unless checked)  Constitutional: [] Weight loss  [] Fever  [] Chills Cardiac: [] Chest pain   [] Chest pressure   [] Palpitations   [x] Shortness of breath when laying flat   [x] Shortness of breath with exertion. Vascular:  [] Pain in legs with walking   [] Pain in legs at rest  [] History of DVT   [] Phlebitis   [x] Swelling in legs   [] Varicose veins   [] Non-healing ulcers Pulmonary:   [x] Uses home oxygen   [] Productive cough   [] Hemoptysis   [] Wheeze  [x] COPD   [] Asthma Neurologic:  [] Dizziness   [] Seizures   [] History of stroke   [] History of TIA  [] Aphasia   [] Vissual changes   [] Weakness or numbness in arm   [x] Weakness or numbness in leg Musculoskeletal:   [] Joint swelling   [] Joint pain   [] Low back pain Hematologic:  [] Easy  bruising  [] Easy bleeding   [] Hypercoagulable state   [] Anemic Gastrointestinal:  [] Diarrhea   [] Vomiting  [] Gastroesophageal reflux/heartburn   [] Difficulty swallowing. Genitourinary:  [] Chronic kidney disease   [] Difficult urination  [] Frequent urination   [] Blood in urine Skin:  [] Rashes   [] Ulcers  Psychological:  [] History of anxiety   []  History of major depression.  Physical Examination  Vitals:   04/08/21 1426  BP: 127/62  Pulse: 71  Weight: 114 lb (51.7 kg)  Height: 5\' 5"  (1.651 m)   Body mass index is 18.97 kg/m. Gen: WD/weak, moderate distress at rest Head: [place]/AT, marked temporalis wasting.  Ear/Nose/Throat: Hearing grossly intact, nares w/o erythema or drainage, poor dentition Eyes: PER, EOMI, sclera nonicteric.  Neck: Supple, no masses.  No bruit or JVD.  Pulmonary: On oxygen by nasal cannula poor air movement, coarse rhonchi to auscultation bilaterally, positive use of accessory muscles.  Cardiac: RRR, normal S1, S2, no Murmurs. Vascular:   Profound edema of both lower extremities extending all the way to the groin.  There are dimpling changes to the skin to the mid thigh Vessel Right Left  Radial Trace palpable Not palpable  Ulnar Not palpable Not palpable  Brachial Palpable Palpable  Gastrointestinal: soft, non-distended.  No guarding/no peritoneal signs.  Musculoskeletal: M/S 4/5 throughout.  No deformity or atrophy.  Neurologic: CN 2-12 intact. Pain and light touch intact in extremities.  Symmetrical.  Speech is fluent. Motor exam as listed above. Psychiatric: Judgment intact, Mood & affect appropriate for pt's clinical situation. Dermatologic: Profound venous rashes no ulcers noted.  No changes consistent with cellulitis. Lymph : Profound lichenification / skin changes of chronic lymphedema.  CBC Lab Results  Component Value Date   WBC 8.0 04/01/2021   HGB 9.7 (L) 04/01/2021   HCT 28.6 (L) 04/01/2021   MCV 81.9 04/01/2021   PLT 100 (L) 04/01/2021     BMET    Component Value Date/Time   NA 129 (L) 04/01/2021 0829   NA 135 (L) 09/15/2014 0948   K 4.0 04/01/2021 0829   K 4.8 09/15/2014 0948   CL 94 (L) 04/01/2021 0829   CL 99 09/15/2014 0948   CO2 26 04/01/2021 0829   CO2 26 09/15/2014 0948   GLUCOSE 135 (H) 04/01/2021 0829   GLUCOSE 118 (H) 09/15/2014 0948   BUN 43 (H) 04/01/2021 0829   BUN 19 (H) 09/15/2014 0948   CREATININE 4.95 (H) 04/01/2021 0829   CREATININE 6.79 (H) 09/15/2014 0948   CALCIUM 7.6 (L) 04/01/2021 0829   CALCIUM 8.3 (L) 09/15/2014 0948   GFRNONAA 9 (L) 04/01/2021 0829   GFRNONAA 7 (L) 09/15/2014 0948   GFRNONAA 6 (L) 05/31/2014 0432   GFRAA 11 (L) 06/25/2020 0641   GFRAA 8 (L) 09/15/2014 0948   GFRAA 7 (L) 05/31/2014 0432   Estimated Creatinine Clearance: 9.7 mL/min (A) (by C-G formula based on SCr of 4.95 mg/dL (H)).  COAG Lab Results  Component Value Date   INR 1.3 (H) 03/29/2021   INR 1.3 (H) 03/17/2021   INR 1.3 (H) 02/14/2021    Radiology DG Shoulder Right  Result Date: 03/30/2021 CLINICAL DATA:  Right shoulder pain EXAM: RIGHT SHOULDER - 2+ VIEW COMPARISON:  None. FINDINGS: No acute fracture or dislocation is noted. No soft tissue abnormality is seen. Dialysis catheter is noted from the left as well as stenting in the right arm and left innominate vein. Mild degenerative changes of the glenohumeral joint and acromioclavicular joint are seen. Bony thorax appears within normal limits. IMPRESSION: No acute abnormality noted.  Degenerative changes are seen. Electronically Signed   By: Inez Catalina M.D.   On: 03/30/2021 19:26   CT Head Wo Contrast  Result Date: 03/29/2021 CLINICAL DATA:  Altered mental status EXAM: CT HEAD WITHOUT CONTRAST TECHNIQUE: Contiguous axial images were obtained from the base of the skull through the vertex without intravenous contrast. COMPARISON:  09/17/2020 FINDINGS: Brain: No evidence of acute infarction, hemorrhage, hydrocephalus, extra-axial collection or mass  lesion/mass effect. Vascular: No hyperdense vessel or unexpected calcification. Skull: Normal. Negative for fracture or focal lesion. Sinuses/Orbits: The visualized paranasal sinuses are essentially clear. The mastoid air cells are unopacified. Other: None. IMPRESSION: Normal head CT. Electronically Signed   By: Julian Hy M.D.   On: 03/29/2021 01:12   CT SOFT TISSUE NECK W CONTRAST  Result Date: 04/01/2021 CLINICAL DATA:  Neck mass, abnormal ultrasound EXAM: CT NECK WITH CONTRAST TECHNIQUE: Multidetector CT imaging of the neck was performed using the standard protocol following the bolus administration of intravenous contrast. CONTRAST:  76mL OMNIPAQUE IOHEXOL 300 MG/ML  SOLN COMPARISON:  Correlation made with upper extremity ultrasound 03/31/2021 FINDINGS: Pharynx and larynx: Unremarkable.  No mass or swelling. Salivary glands: Unremarkable. Thyroid: Unremarkable. Lymph nodes: Enlarged right  supraclavicular node measuring 2.1 cm (series 2, image 73). No additional enlarged nodes identified. Vascular: There is narrowing of the central right internal jugular vein. No flow within the central left internal jugular vein. Mixed plaque at the right ICA origin. Left subclavian and brachiocephalic vein stenting. No apparent subclavian flow bilaterally. Given patency on recent ultrasound, presumably due to phase of study. Extensive venous collateral formation. Left IJ approach catheter partially imaged. Calcified plaque at the left ICA origin. Likely similar to prior CTA. Limited intracranial: No abnormal enhancement. Visualized orbits: No significant abnormality. Mastoids and visualized paranasal sinuses: Left mastoid and middle ear effusions. No significant paranasal sinus opacification. Skeleton: No acute abnormality. Upper chest: Mild interlobular septal thickening may reflect edema. Small left pleural effusion with areas of loculation. Chest wall edema. Other: Subcutaneous edema. IMPRESSION: 2 cm enlarged  right supraclavicular node. No additional enlarged nodes are identified. Subcutaneous edema, mild interstitial pulmonary edema, and partially loculated small left pleural effusion suggesting volume overload. Venous collateral formation is noted. There is no apparent flow in the subclavian or central internal jugular veins. May reflect timing of study and reduced flow, noting reported patency on ultrasound. Electronically Signed   By: Macy Mis M.D.   On: 04/01/2021 11:06   US Venous Img Lower Bilateral (DVT)  Result Date: 03/17/2021 CLINICAL DATA:  Bilateral leg pain and swelling. EXAM: BILATERAL LOWER EXTREMITY VENOUS DOPPLER ULTRASOUND TECHNIQUE: Gray-scale sonography with compression, as well as color and duplex ultrasound, were performed to evaluate the deep venous system(s) from the level of the common femoral vein through the popliteal and proximal calf veins. COMPARISON:  None. FINDINGS: VENOUS Normal compressibility of the BILATERAL common femoral, superficial femoral, and popliteal veins, as well as the visualized BILATERAL calf veins. Visualized portions of the BILATERAL profunda femoral veins and BILATERAL great saphenous veins are unremarkable. No filling defects to suggest DVT on grayscale or color Doppler imaging. Doppler waveforms show normal direction of venous flow and normal respiratory plasticity. OTHER Marked severity diffuse bilateral lower extremity edema is seen. Limitations: It should be noted that the patient refused augmentation secondary to pain (as per the ultrasound technologist). IMPRESSION: 1. Limited study, as described above, without evidence of DVT within the RIGHT or LEFT lower extremity. 2. Diffuse bilateral lower extremity edema. Electronically Signed   By: Virgina Norfolk M.D.   On: 03/17/2021 22:55   US Venous Img Upper Uni Right(DVT)  Result Date: 03/31/2021 CLINICAL DATA:  Right upper extremity over 1 year EXAM: RIGHT UPPER EXTREMITY VENOUS DOPPLER ULTRASOUND  TECHNIQUE: Gray-scale sonography with graded compression, as well as color Doppler and duplex ultrasound were performed to evaluate the upper extremity deep venous system from the level of the subclavian vein and including the jugular, axillary, basilic, radial, ulnar and upper cephalic vein. Spectral Doppler was utilized to evaluate flow at rest and with distal augmentation maneuvers. COMPARISON:  08/11/2020 FINDINGS: Contralateral Subclavian Vein: Respiratory phasicity is normal and symmetric with the symptomatic side. No evidence of thrombus. Normal compressibility. Internal Jugular Vein: No evidence of thrombus. Normal compressibility, respiratory phasicity and response to augmentation. Subclavian Vein: No evidence of thrombus. Normal compressibility, respiratory phasicity and response to augmentation. Axillary Vein: No evidence of thrombus. Normal compressibility, respiratory phasicity and response to augmentation. Cephalic Vein: No evidence of thrombus. Normal compressibility, respiratory phasicity and response to augmentation. Basilic Vein: No evidence of thrombus. Normal compressibility, respiratory phasicity and response to augmentation. Brachial Veins: No evidence of thrombus. Normal compressibility, respiratory phasicity and response to augmentation. Radial  Veins: No evidence of thrombus. Normal compressibility, respiratory phasicity and response to augmentation. Ulnar Veins: No evidence of thrombus. Normal compressibility, respiratory phasicity and response to augmentation. Other Findings: 2.0 x 1.4 x 1.1 cm ovoid hypoechoic structure in the right supraclavicular region is suspicious for abnormally enlarged lymph node. Thrombosed right upper extremity graft is noted. In the right medial upper arm there is a 5.1 x 2.1 x 2.1 cm ovoid hypoechoic mass. This may be related to a thrombosed pseudoaneurysm. IMPRESSION: 1. No right upper extremity DVT. 2. Abnormally enlarged lymph node seen in the right  supraclavicular region measuring 2.0 x 1.4 x 1.1 cm should be further evaluated with contrast enhanced soft tissue neck CT. 3. 5.1 x 2.1 x 2.1 cm ovoid hypoechoic mass in the right upper arm is most likely a thrombosed pseudoaneurysm related to occluded dialysis graft. Recommend confirmation with contrast enhanced MRI or CT of the right upper arm. Electronically Signed   By: Miachel Roux M.D.   On: 03/31/2021 13:02   DG Chest Port 1 View  Result Date: 03/29/2021 CLINICAL DATA:  Sepsis EXAM: PORTABLE CHEST 1 VIEW COMPARISON:  03/17/2021 FINDINGS: Cardiomegaly with pulmonary vascular congestion. No frank interstitial edema. Mild bibasilar opacities, likely atelectasis. Suspected small left pleural effusion. No pneumothorax. Left IJ dual lumen dialysis catheter terminating in the upper right atrium. Left subclavian stent. IMPRESSION: Cardiomegaly with pulmonary vascular congestion. No frank interstitial edema. Small left pleural effusion. Mild bibasilar opacities, likely atelectasis. Electronically Signed   By: Julian Hy M.D.   On: 03/29/2021 00:44   DG Chest Port 1 View  Result Date: 03/17/2021 CLINICAL DATA:  Patient leaking fluid from right leg. End-stage renal disease. History of CHF. EXAM: PORTABLE CHEST 1 VIEW COMPARISON:  Feb 14, 2021 FINDINGS: The cardiomediastinal silhouette is stable. Diffuse bilateral interstitial opacities, similar in the interval. The dialysis catheter is stable terminating near the caval atrial junction and in the right atrium. No pneumothorax. No other changes. IMPRESSION: Findings are most consistent with CHF.  Stable left central line. Electronically Signed   By: Dorise Bullion III M.D   On: 03/17/2021 19:41    Assessment/Plan 1. ESRD on hemodialysis ([place]) Recommend:  At this time the patient does not have appropriate extremity access for dialysis  Patient is currently scheduled for a right brachial axillary AV graft..  However, have great reservations regarding  moving forward given her current condition.  This will be a redo brachial axillary graft.  Of note she is already complaining of chronic pain in the right arm.  I believe this will be the third access in the upper arm.  In the past she has had to have her access immediately ligated secondary to steal.  This could have been because of the lesions identified a few months ago which appear to be well treated but that remains to be seen.  At this point from a vascular point of view we have very few options and all other things aside creation of a right brachial axillary AV graft would be the proper thing to do but I have made it clear that this may not work that we may encounter steal.  In the past with anesthesia she becomes profoundly hypotensive this is true also on dialysis and she has not been able to keep grafts open as they continuously clot.  My greatest concern however is the rather dramatic deconditioning poor pulmonary status and significant coronary disease.  At the present time her catheter appears to be functioning.  I certainly concede that getting a true extremity access would be a significant improvement and clearly there are issues with her fluid status given the rather dramatic swelling and edema of her lower extremities.  In fact she complains more about pain in her lower extremities than anything else.  I will contact nephrology and cardiology to see if they have any input and really to see if she is even a surgical candidate.  FYI because this is a redo redo she will most certainly need a general anesthesia  The risks, benefits and alternative therapies were reviewed in detail with the patient.  All questions were answered.  The patient agrees to consider to proceed with surgery.    2. Complication of arteriovenous dialysis fistula, subsequent encounter See #1  3. Coronary artery disease involving native coronary artery of native heart, unspecified whether angina present Continue cardiac  and antihypertensive medications as already ordered and reviewed, no changes at this time.  Continue statin as ordered and reviewed, no changes at this time  Nitrates PRN for chest pain   4. Chronic respiratory failure with hypoxia (HCC) Continue pulmonary medications and aerosols as already ordered, these medications have been reviewed and there are no changes at this time.    5. Acute on chronic diastolic CHF (congestive heart failure) (HCC) Continue cardiac and antihypertensive medications as already ordered and reviewed, no changes at this time.  Continue statin as ordered and reviewed, no changes at this time  Nitrates PRN for chest pain     Hortencia Pilar, MD  04/10/2021 8:23 AM

## 2021-04-11 ENCOUNTER — Telehealth: Payer: Self-pay | Admitting: Oncology

## 2021-04-11 IMAGING — DX DG CHEST 1V PORT
1 series · 1 of 1 positions shown · non-contrast
Comparison: 11/21/2020

CLINICAL DATA: Shortness of breath.

EXAM:
PORTABLE CHEST 1 VIEW

[chest ap]
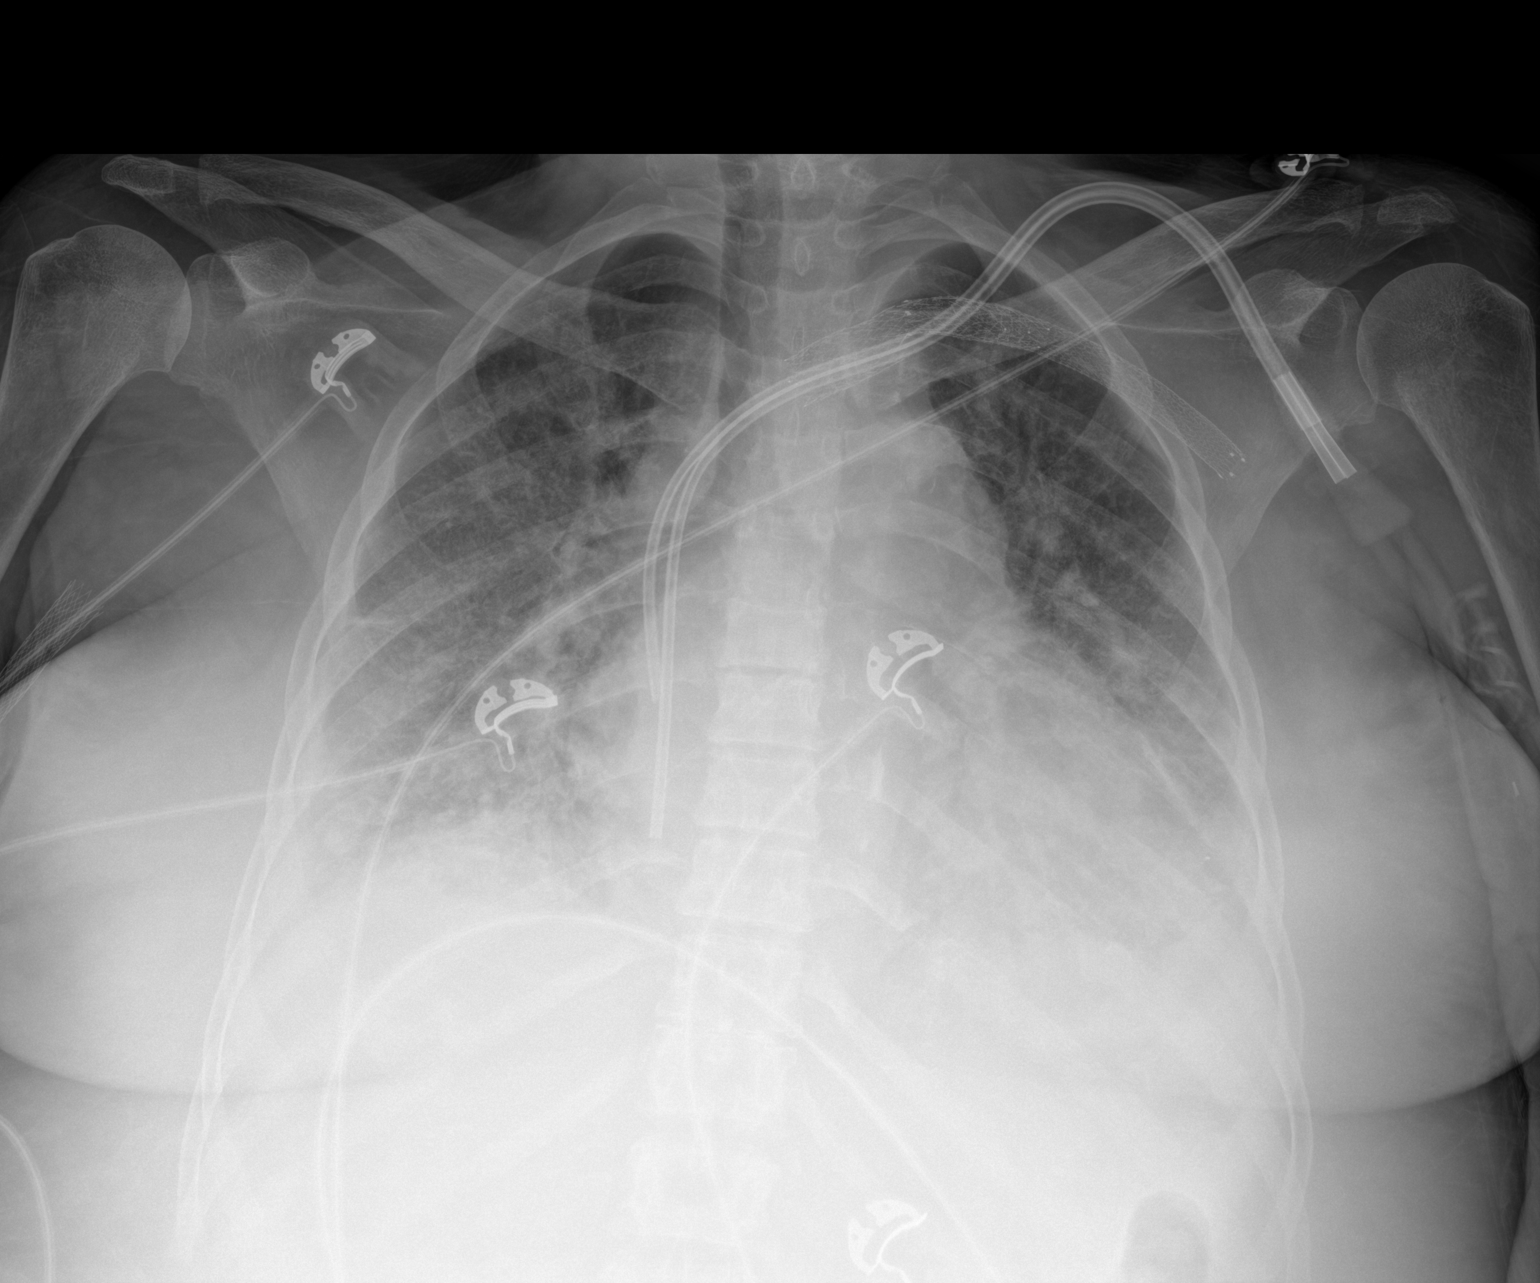

[1 of 1 positions shown; findings below may reference images not displayed]

FINDINGS: 4462 hours. The cardio pericardial silhouette is enlarged. Vascular
congestion diffuse interstitial opacity is compatible with edema.
Probable airspace edema in the lower lungs with small bilateral
pleural effusions. Left IJ dialysis catheter tip is in the lower
right atrium. Subclavian vascular stent device evident.
IMPRESSION: Cardiomegaly with pulmonary edema and small bilateral pleural
effusions.

## 2021-04-11 IMAGING — US US ABDOMEN LIMITED RUQ/ASCITES
1 series · 10 of 10 positions shown · non-contrast
Comparison: 10/29/2020

CLINICAL DATA: Ascites

EXAM:
LIMITED ABDOMEN ULTRASOUND FOR ASCITES
TECHNIQUE: Limited ultrasound survey for ascites was performed in all four
abdominal quadrants.

[Series 1: us ascites (abdomen limited) · 10 of 10 slices shown]
[im 1/10]
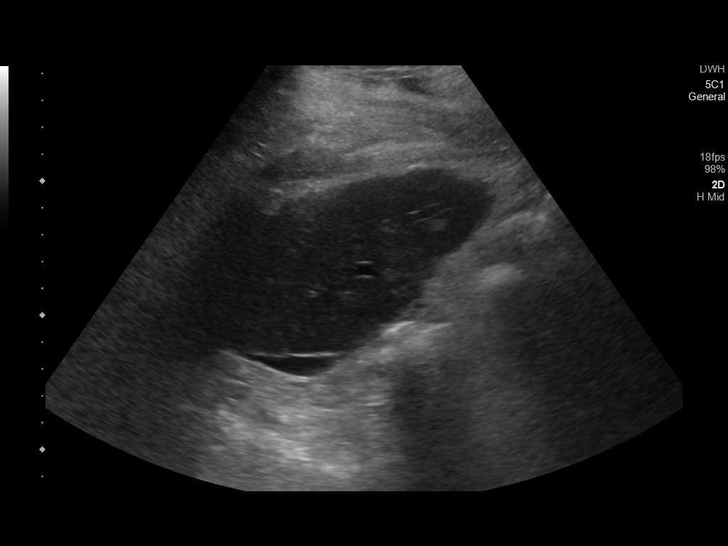
[im 2/10]
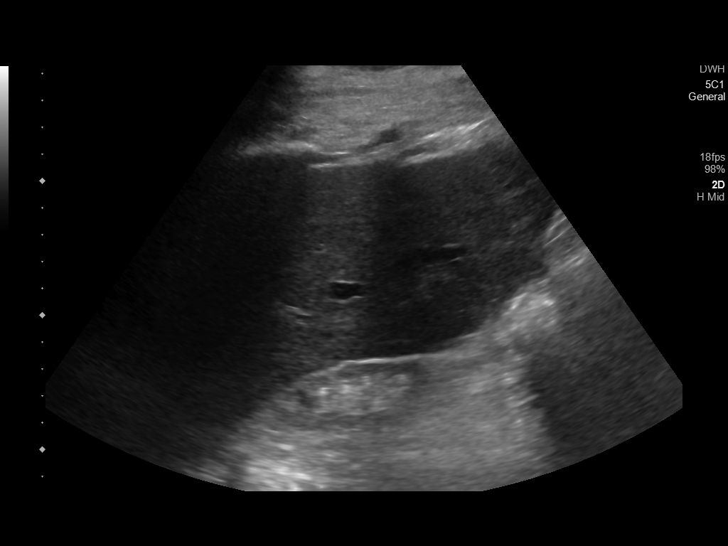
[im 3/10]
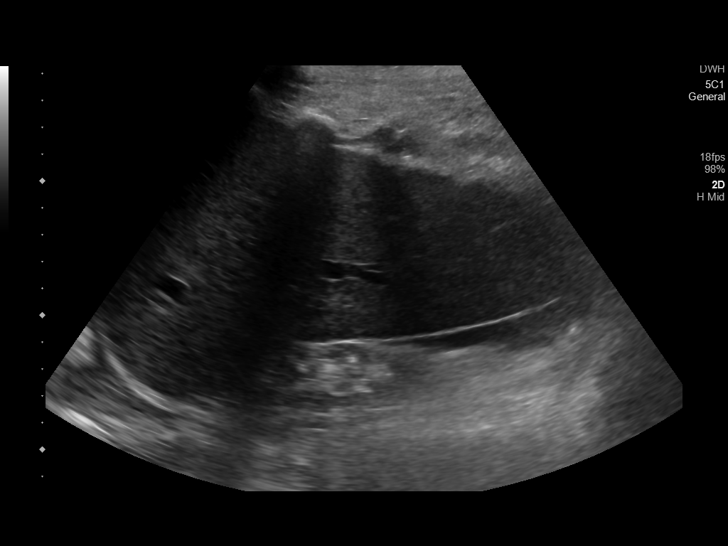
[im 4/10]
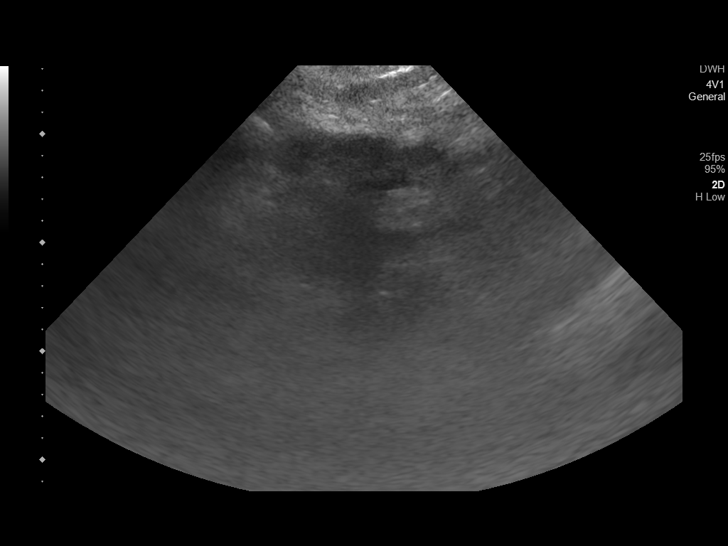
[im 5/10]
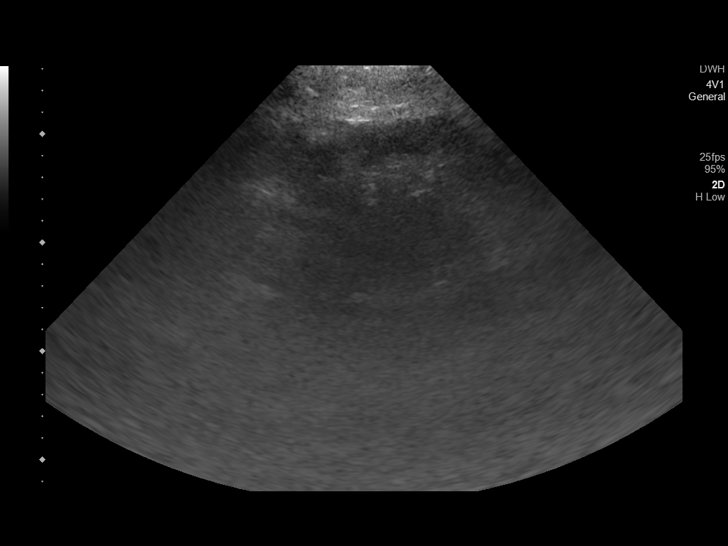
[im 6/10]
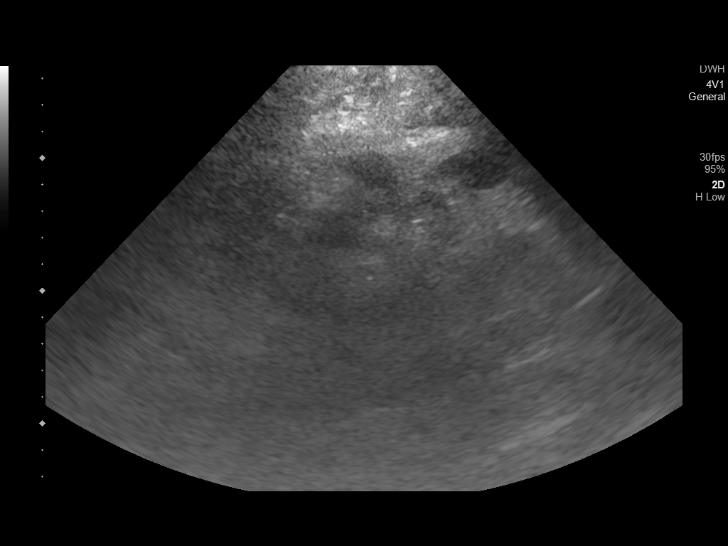
[im 7/10]
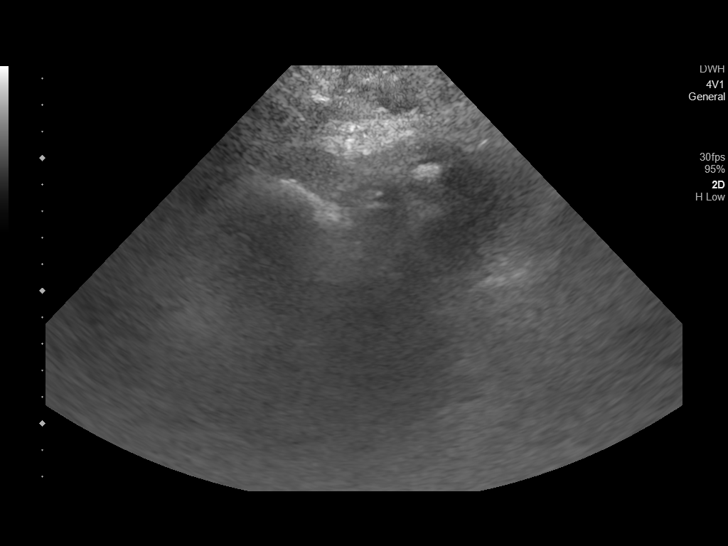
[im 8/10]
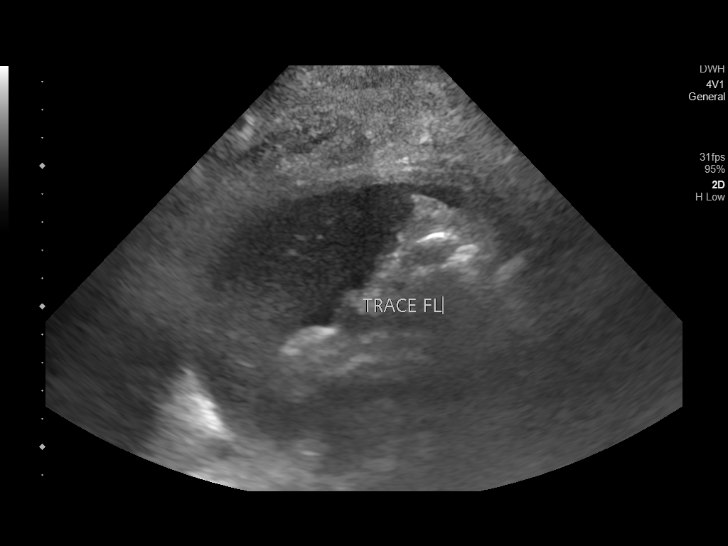
[im 9/10]
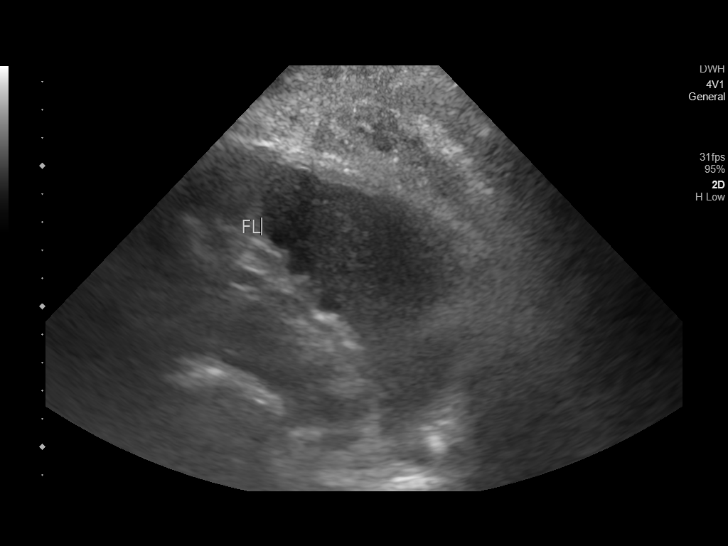
[im 10/10]
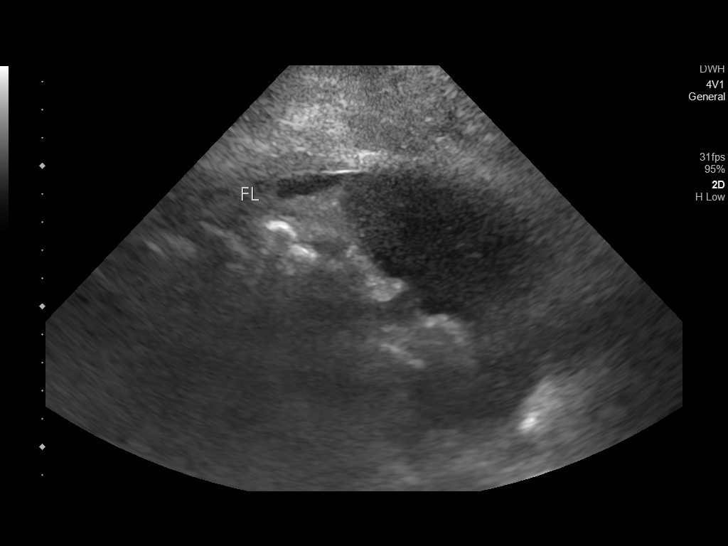

[10 of 10 positions shown; findings below may reference images not displayed]

FINDINGS: Sonographic evaluation of the 4 quadrants of the abdomen are
obtained. There is trace ascites within the bilateral upper quadrant
surrounding the spleen and liver. Insufficient volume for
paracentesis.
IMPRESSION: 1. Trace ascites within the bilateral upper quadrants. Volume is
insufficient for paracentesis.

## 2021-04-11 NOTE — Telephone Encounter (Signed)
Attempt made to reach patient--her mailbox was full.   Spoke to patient's son about patient's missed appointment with Dr. Janese Banks. He stated that he would have his mom call the office to get rescheduled when she feels up to coming in.

## 2021-04-18 ENCOUNTER — Telehealth: Payer: Self-pay | Admitting: Primary Care

## 2021-04-18 NOTE — Telephone Encounter (Signed)
Attempted to contact patient to offer to schedule a Palliative Consult, no answer and unable to leave a message due to voicemail was full.

## 2021-04-22 ENCOUNTER — Other Ambulatory Visit: Payer: Medicare Other

## 2021-04-22 ENCOUNTER — Other Ambulatory Visit (INDEPENDENT_AMBULATORY_CARE_PROVIDER_SITE_OTHER): Payer: Self-pay | Admitting: Nurse Practitioner

## 2021-04-23 ENCOUNTER — Inpatient Hospital Stay: Admission: RE | Admit: 2021-04-23 | Payer: Medicare Other | Source: Ambulatory Visit

## 2021-04-25 ENCOUNTER — Emergency Department: Payer: Medicare Other

## 2021-04-25 ENCOUNTER — Inpatient Hospital Stay
Admission: EM | Admit: 2021-04-25 | Discharge: 2021-05-10 | DRG: 314 | Disposition: A | Discharge status: Skilled Nursing Facility | Payer: Medicare Other | Attending: Obstetrics and Gynecology | Admitting: Obstetrics and Gynecology

## 2021-04-25 ENCOUNTER — Other Ambulatory Visit: Payer: Self-pay

## 2021-04-25 DIAGNOSIS — L89302 Pressure ulcer of unspecified buttock, stage 2: Secondary | ICD-10-CM | POA: Diagnosis present

## 2021-04-25 DIAGNOSIS — J440 Chronic obstructive pulmonary disease with acute lower respiratory infection: Secondary | ICD-10-CM | POA: Diagnosis present

## 2021-04-25 DIAGNOSIS — Z961 Presence of intraocular lens: Secondary | ICD-10-CM | POA: Diagnosis present

## 2021-04-25 DIAGNOSIS — E8809 Other disorders of plasma-protein metabolism, not elsewhere classified: Secondary | ICD-10-CM | POA: Diagnosis present

## 2021-04-25 DIAGNOSIS — G9341 Metabolic encephalopathy: Secondary | ICD-10-CM | POA: Diagnosis present

## 2021-04-25 DIAGNOSIS — E11649 Type 2 diabetes mellitus with hypoglycemia without coma: Secondary | ICD-10-CM | POA: Diagnosis present

## 2021-04-25 DIAGNOSIS — Z992 Dependence on renal dialysis: Secondary | ICD-10-CM

## 2021-04-25 DIAGNOSIS — Z20822 Contact with and (suspected) exposure to covid-19: Secondary | ICD-10-CM | POA: Diagnosis present

## 2021-04-25 DIAGNOSIS — Z9841 Cataract extraction status, right eye: Secondary | ICD-10-CM

## 2021-04-25 DIAGNOSIS — Z978 Presence of other specified devices: Secondary | ICD-10-CM

## 2021-04-25 DIAGNOSIS — R1310 Dysphagia, unspecified: Secondary | ICD-10-CM | POA: Diagnosis present

## 2021-04-25 DIAGNOSIS — E440 Moderate protein-calorie malnutrition: Secondary | ICD-10-CM | POA: Diagnosis present

## 2021-04-25 DIAGNOSIS — F419 Anxiety disorder, unspecified: Secondary | ICD-10-CM | POA: Diagnosis present

## 2021-04-25 DIAGNOSIS — R5381 Other malaise: Secondary | ICD-10-CM

## 2021-04-25 DIAGNOSIS — Z4659 Encounter for fitting and adjustment of other gastrointestinal appliance and device: Secondary | ICD-10-CM

## 2021-04-25 DIAGNOSIS — J441 Chronic obstructive pulmonary disease with (acute) exacerbation: Secondary | ICD-10-CM | POA: Diagnosis present

## 2021-04-25 DIAGNOSIS — R5081 Fever presenting with conditions classified elsewhere: Secondary | ICD-10-CM

## 2021-04-25 DIAGNOSIS — Z9109 Other allergy status, other than to drugs and biological substances: Secondary | ICD-10-CM

## 2021-04-25 DIAGNOSIS — R7881 Bacteremia: Secondary | ICD-10-CM | POA: Diagnosis not present

## 2021-04-25 DIAGNOSIS — K219 Gastro-esophageal reflux disease without esophagitis: Secondary | ICD-10-CM | POA: Diagnosis present

## 2021-04-25 DIAGNOSIS — D696 Thrombocytopenia, unspecified: Secondary | ICD-10-CM | POA: Diagnosis present

## 2021-04-25 DIAGNOSIS — L97519 Non-pressure chronic ulcer of other part of right foot with unspecified severity: Secondary | ICD-10-CM | POA: Diagnosis present

## 2021-04-25 DIAGNOSIS — Y95 Nosocomial condition: Secondary | ICD-10-CM | POA: Diagnosis present

## 2021-04-25 DIAGNOSIS — Z841 Family history of disorders of kidney and ureter: Secondary | ICD-10-CM

## 2021-04-25 DIAGNOSIS — N186 End stage renal disease: Secondary | ICD-10-CM | POA: Diagnosis present

## 2021-04-25 DIAGNOSIS — K5730 Diverticulosis of large intestine without perforation or abscess without bleeding: Secondary | ICD-10-CM | POA: Diagnosis present

## 2021-04-25 DIAGNOSIS — N2581 Secondary hyperparathyroidism of renal origin: Secondary | ICD-10-CM | POA: Diagnosis present

## 2021-04-25 DIAGNOSIS — H9190 Unspecified hearing loss, unspecified ear: Secondary | ICD-10-CM | POA: Diagnosis present

## 2021-04-25 DIAGNOSIS — Z9842 Cataract extraction status, left eye: Secondary | ICD-10-CM

## 2021-04-25 DIAGNOSIS — E1159 Type 2 diabetes mellitus with other circulatory complications: Secondary | ICD-10-CM | POA: Diagnosis not present

## 2021-04-25 DIAGNOSIS — Z79818 Long term (current) use of other agents affecting estrogen receptors and estrogen levels: Secondary | ICD-10-CM

## 2021-04-25 DIAGNOSIS — J189 Pneumonia, unspecified organism: Secondary | ICD-10-CM | POA: Diagnosis present

## 2021-04-25 DIAGNOSIS — R6521 Severe sepsis with septic shock: Secondary | ICD-10-CM | POA: Diagnosis present

## 2021-04-25 DIAGNOSIS — E1142 Type 2 diabetes mellitus with diabetic polyneuropathy: Secondary | ICD-10-CM | POA: Diagnosis present

## 2021-04-25 DIAGNOSIS — T80211A Bloodstream infection due to central venous catheter, initial encounter: Secondary | ICD-10-CM | POA: Diagnosis present

## 2021-04-25 DIAGNOSIS — Y832 Surgical operation with anastomosis, bypass or graft as the cause of abnormal reaction of the patient, or of later complication, without mention of misadventure at the time of the procedure: Secondary | ICD-10-CM | POA: Diagnosis present

## 2021-04-25 DIAGNOSIS — Y848 Other medical procedures as the cause of abnormal reaction of the patient, or of later complication, without mention of misadventure at the time of the procedure: Secondary | ICD-10-CM | POA: Diagnosis present

## 2021-04-25 DIAGNOSIS — L8992 Pressure ulcer of unspecified site, stage 2: Secondary | ICD-10-CM

## 2021-04-25 DIAGNOSIS — A4159 Other Gram-negative sepsis: Secondary | ICD-10-CM | POA: Diagnosis present

## 2021-04-25 DIAGNOSIS — E1151 Type 2 diabetes mellitus with diabetic peripheral angiopathy without gangrene: Secondary | ICD-10-CM | POA: Diagnosis present

## 2021-04-25 DIAGNOSIS — I2510 Atherosclerotic heart disease of native coronary artery without angina pectoris: Secondary | ICD-10-CM | POA: Diagnosis not present

## 2021-04-25 DIAGNOSIS — I252 Old myocardial infarction: Secondary | ICD-10-CM

## 2021-04-25 DIAGNOSIS — Z90710 Acquired absence of both cervix and uterus: Secondary | ICD-10-CM

## 2021-04-25 DIAGNOSIS — I5033 Acute on chronic diastolic (congestive) heart failure: Secondary | ICD-10-CM | POA: Diagnosis present

## 2021-04-25 DIAGNOSIS — Z85528 Personal history of other malignant neoplasm of kidney: Secondary | ICD-10-CM

## 2021-04-25 DIAGNOSIS — Z833 Family history of diabetes mellitus: Secondary | ICD-10-CM

## 2021-04-25 DIAGNOSIS — Z8619 Personal history of other infectious and parasitic diseases: Secondary | ICD-10-CM

## 2021-04-25 DIAGNOSIS — T829XXA Unspecified complication of cardiac and vascular prosthetic device, implant and graft, initial encounter: Secondary | ICD-10-CM | POA: Diagnosis present

## 2021-04-25 DIAGNOSIS — Z7982 Long term (current) use of aspirin: Secondary | ICD-10-CM

## 2021-04-25 DIAGNOSIS — E1143 Type 2 diabetes mellitus with diabetic autonomic (poly)neuropathy: Secondary | ICD-10-CM | POA: Diagnosis present

## 2021-04-25 DIAGNOSIS — Z23 Encounter for immunization: Secondary | ICD-10-CM | POA: Diagnosis not present

## 2021-04-25 DIAGNOSIS — J9601 Acute respiratory failure with hypoxia: Secondary | ICD-10-CM | POA: Diagnosis present

## 2021-04-25 DIAGNOSIS — R1011 Right upper quadrant pain: Secondary | ICD-10-CM

## 2021-04-25 DIAGNOSIS — Z9049 Acquired absence of other specified parts of digestive tract: Secondary | ICD-10-CM

## 2021-04-25 DIAGNOSIS — E11621 Type 2 diabetes mellitus with foot ulcer: Secondary | ICD-10-CM | POA: Diagnosis present

## 2021-04-25 DIAGNOSIS — R531 Weakness: Secondary | ICD-10-CM | POA: Diagnosis not present

## 2021-04-25 DIAGNOSIS — Z515 Encounter for palliative care: Secondary | ICD-10-CM | POA: Diagnosis not present

## 2021-04-25 DIAGNOSIS — I132 Hypertensive heart and chronic kidney disease with heart failure and with stage 5 chronic kidney disease, or end stage renal disease: Secondary | ICD-10-CM | POA: Diagnosis present

## 2021-04-25 DIAGNOSIS — Z79899 Other long term (current) drug therapy: Secondary | ICD-10-CM

## 2021-04-25 DIAGNOSIS — E119 Type 2 diabetes mellitus without complications: Secondary | ICD-10-CM

## 2021-04-25 DIAGNOSIS — Z89422 Acquired absence of other left toe(s): Secondary | ICD-10-CM

## 2021-04-25 DIAGNOSIS — Z66 Do not resuscitate: Secondary | ICD-10-CM | POA: Diagnosis present

## 2021-04-25 DIAGNOSIS — E785 Hyperlipidemia, unspecified: Secondary | ICD-10-CM | POA: Diagnosis present

## 2021-04-25 DIAGNOSIS — E1122 Type 2 diabetes mellitus with diabetic chronic kidney disease: Secondary | ICD-10-CM | POA: Diagnosis present

## 2021-04-25 DIAGNOSIS — Z888 Allergy status to other drugs, medicaments and biological substances status: Secondary | ICD-10-CM

## 2021-04-25 DIAGNOSIS — D62 Acute posthemorrhagic anemia: Secondary | ICD-10-CM | POA: Diagnosis present

## 2021-04-25 DIAGNOSIS — R188 Other ascites: Secondary | ICD-10-CM | POA: Diagnosis present

## 2021-04-25 DIAGNOSIS — I25119 Atherosclerotic heart disease of native coronary artery with unspecified angina pectoris: Secondary | ICD-10-CM | POA: Diagnosis present

## 2021-04-25 DIAGNOSIS — A419 Sepsis, unspecified organism: Secondary | ICD-10-CM | POA: Diagnosis not present

## 2021-04-25 DIAGNOSIS — Z1611 Resistance to penicillins: Secondary | ICD-10-CM | POA: Diagnosis present

## 2021-04-25 DIAGNOSIS — Z794 Long term (current) use of insulin: Secondary | ICD-10-CM

## 2021-04-25 DIAGNOSIS — L97529 Non-pressure chronic ulcer of other part of left foot with unspecified severity: Secondary | ICD-10-CM | POA: Diagnosis present

## 2021-04-25 DIAGNOSIS — K3184 Gastroparesis: Secondary | ICD-10-CM | POA: Diagnosis present

## 2021-04-25 DIAGNOSIS — M899 Disorder of bone, unspecified: Secondary | ICD-10-CM | POA: Diagnosis present

## 2021-04-25 DIAGNOSIS — Z7189 Other specified counseling: Secondary | ICD-10-CM | POA: Diagnosis not present

## 2021-04-25 DIAGNOSIS — I708 Atherosclerosis of other arteries: Secondary | ICD-10-CM | POA: Diagnosis present

## 2021-04-25 DIAGNOSIS — E872 Acidosis: Secondary | ICD-10-CM | POA: Diagnosis present

## 2021-04-25 DIAGNOSIS — R509 Fever, unspecified: Secondary | ICD-10-CM | POA: Diagnosis present

## 2021-04-25 DIAGNOSIS — Z6828 Body mass index (BMI) 28.0-28.9, adult: Secondary | ICD-10-CM

## 2021-04-25 DIAGNOSIS — R6520 Severe sepsis without septic shock: Secondary | ICD-10-CM | POA: Diagnosis not present

## 2021-04-25 DIAGNOSIS — Z87440 Personal history of urinary (tract) infections: Secondary | ICD-10-CM

## 2021-04-25 DIAGNOSIS — E871 Hypo-osmolality and hyponatremia: Secondary | ICD-10-CM | POA: Diagnosis present

## 2021-04-25 DIAGNOSIS — D631 Anemia in chronic kidney disease: Secondary | ICD-10-CM

## 2021-04-25 DIAGNOSIS — G8929 Other chronic pain: Secondary | ICD-10-CM | POA: Diagnosis present

## 2021-04-25 LAB — CBC WITH DIFFERENTIAL/PLATELET
Abs Immature Granulocytes: 0.01 10*3/uL (ref 0.00–0.07)
Basophils Absolute: 0 10*3/uL (ref 0.0–0.1)
Basophils Relative: 0 %
Eosinophils Absolute: 0.2 10*3/uL (ref 0.0–0.5)
Eosinophils Relative: 4 %
HCT: 28.8 % — ABNORMAL LOW (ref 36.0–46.0)
Hemoglobin: 9.1 g/dL — ABNORMAL LOW (ref 12.0–15.0)
Immature Granulocytes: 0 %
Lymphocytes Relative: 15 %
Lymphs Abs: 0.8 10*3/uL (ref 0.7–4.0)
MCH: 28.3 pg (ref 26.0–34.0)
MCHC: 31.6 g/dL (ref 30.0–36.0)
MCV: 89.7 fL (ref 80.0–100.0)
Monocytes Absolute: 0.2 10*3/uL (ref 0.1–1.0)
Monocytes Relative: 4 %
Neutro Abs: 4.5 10*3/uL (ref 1.7–7.7)
Neutrophils Relative %: 77 %
Platelets: 245 10*3/uL (ref 150–400)
RBC: 3.21 MIL/uL — ABNORMAL LOW (ref 3.87–5.11)
RDW: 22.7 % — ABNORMAL HIGH (ref 11.5–15.5)
WBC: 5.7 10*3/uL (ref 4.0–10.5)
nRBC: 0 % (ref 0.0–0.2)

## 2021-04-25 LAB — COMPREHENSIVE METABOLIC PANEL
ALT: 14 U/L (ref 0–44)
AST: 23 U/L (ref 15–41)
Albumin: 2.3 g/dL — ABNORMAL LOW (ref 3.5–5.0)
Alkaline Phosphatase: 137 U/L — ABNORMAL HIGH (ref 38–126)
Anion gap: 9 (ref 5–15)
BUN: 14 mg/dL (ref 8–23)
CO2: 24 mmol/L (ref 22–32)
Calcium: 8 mg/dL — ABNORMAL LOW (ref 8.9–10.3)
Chloride: 100 mmol/L (ref 98–111)
Creatinine, Ser: 3.63 mg/dL — ABNORMAL HIGH (ref 0.44–1.00)
GFR, Estimated: 14 mL/min — ABNORMAL LOW (ref >60)
Glucose, Bld: 46 mg/dL — ABNORMAL LOW (ref 70–99)
Potassium: 4.9 mmol/L (ref 3.5–5.1)
Sodium: 133 mmol/L — ABNORMAL LOW (ref 135–145)
Total Bilirubin: 0.5 mg/dL (ref 0.3–1.2)
Total Protein: 7.6 g/dL (ref 6.5–8.1)

## 2021-04-25 LAB — CBG MONITORING, ED
Glucose-Capillary: 102 mg/dL — ABNORMAL HIGH (ref 70–99)
Glucose-Capillary: 14 mg/dL — CL (ref 70–99)
Glucose-Capillary: <10 mg/dL — CL (ref 70–99)
Glucose-Capillary: 106 mg/dL — ABNORMAL HIGH (ref 70–99)
Glucose-Capillary: 51 mg/dL — ABNORMAL LOW (ref 70–99)
Glucose-Capillary: 95 mg/dL (ref 70–99)
Glucose-Capillary: 35 mg/dL — CL (ref 70–99)

## 2021-04-25 LAB — RESP PANEL BY RT-PCR (FLU A&B, COVID) ARPGX2
Influenza A by PCR: NEGATIVE
Influenza B by PCR: NEGATIVE
SARS Coronavirus 2 by RT PCR: NEGATIVE

## 2021-04-25 LAB — PROTIME-INR
INR: 1.2 (ref 0.8–1.2)
Prothrombin Time: 15.1 seconds (ref 11.4–15.2)

## 2021-04-25 LAB — MAGNESIUM
Magnesium: 1.7 mg/dL (ref 1.7–2.4)
Magnesium: 1.7 mg/dL (ref 1.7–2.4)

## 2021-04-25 LAB — TSH: TSH: 6.868 u[IU]/mL — ABNORMAL HIGH (ref 0.350–4.500)

## 2021-04-25 LAB — PROCALCITONIN: Procalcitonin: 0.81 ng/mL

## 2021-04-25 LAB — LACTIC ACID, PLASMA
Lactic Acid, Venous: 1.4 mmol/L (ref 0.5–1.9)
Lactic Acid, Venous: 1.1 mmol/L (ref 0.5–1.9)

## 2021-04-25 LAB — BRAIN NATRIURETIC PEPTIDE: B Natriuretic Peptide: 821.4 pg/mL — ABNORMAL HIGH (ref 0.0–100.0)

## 2021-04-25 LAB — TROPONIN I (HIGH SENSITIVITY)
Troponin I (High Sensitivity): 10 ng/L (ref <18)
Troponin I (High Sensitivity): 12 ng/L (ref <18)

## 2021-04-25 LAB — LIPASE, BLOOD: Lipase: 20 U/L (ref 11–51)

## 2021-04-25 MED ORDER — SODIUM CHLORIDE 0.9 % IV SOLN
500.0000 mg | Freq: Once | INTRAVENOUS | Status: AC
  Administered 2021-04-25: 500 mg via INTRAVENOUS
  Filled 2021-04-25: qty 500

## 2021-04-25 MED ORDER — VANCOMYCIN HCL 500 MG/100ML IV SOLN
500.0000 mg | INTRAVENOUS | Status: DC | PRN
  Filled 2021-04-25: qty 100

## 2021-04-25 MED ORDER — ACETAMINOPHEN 325 MG PO TABS: 650.0000 mg | ORAL_TABLET | Freq: Four times a day (QID) | ORAL | Status: DC | PRN

## 2021-04-25 MED ORDER — FUROSEMIDE 40 MG PO TABS: 80.0000 mg | ORAL_TABLET | Freq: Every day | ORAL | Status: DC

## 2021-04-25 MED ORDER — SEVELAMER CARBONATE 800 MG PO TABS
800.0000 mg | ORAL_TABLET | Freq: Three times a day (TID) | ORAL | Status: DC
  Administered 2021-04-26: 800 mg via ORAL
  Filled 2021-04-25 (×5): qty 1

## 2021-04-25 MED ORDER — DEXTROSE 50 % IV SOLN
INTRAVENOUS | Status: AC
  Administered 2021-04-25: 50 mL via INTRAVENOUS
  Filled 2021-04-25: qty 50

## 2021-04-25 MED ORDER — DEXTROSE-NACL 10-0.45 % IV SOLN
INTRAVENOUS | Status: DC
  Filled 2021-04-25 (×8): qty 1000

## 2021-04-25 MED ORDER — VANCOMYCIN HCL 1250 MG/250ML IV SOLN
1250.0000 mg | Freq: Once | INTRAVENOUS | Status: AC
  Administered 2021-04-25: 1250 mg via INTRAVENOUS
  Filled 2021-04-25: qty 250

## 2021-04-25 MED ORDER — ASPIRIN EC 81 MG PO TBEC
81.0000 mg | DELAYED_RELEASE_TABLET | Freq: Every day | ORAL | Status: DC
  Filled 2021-04-25: qty 1

## 2021-04-25 MED ORDER — ACETAMINOPHEN 500 MG PO TABS
1000.0000 mg | ORAL_TABLET | Freq: Once | ORAL | Status: AC
Start: 2021-04-25 — End: 2021-04-25
  Administered 2021-04-25: 1000 mg via ORAL
  Filled 2021-04-25: qty 2

## 2021-04-25 MED ORDER — VITAMIN D 25 MCG (1000 UNIT) PO TABS
1000.0000 [IU] | ORAL_TABLET | Freq: Every day | ORAL | Status: DC
  Filled 2021-04-25: qty 1

## 2021-04-25 MED ORDER — ACETAMINOPHEN 500 MG PO TABS
1000.0000 mg | ORAL_TABLET | Freq: Once | ORAL | Status: AC
  Administered 2021-04-25: 1000 mg via ORAL
  Filled 2021-04-25: qty 2

## 2021-04-25 MED ORDER — PANCRELIPASE (LIP-PROT-AMYL) 36000-114000 UNITS PO CPEP
36000.0000 [IU] | ORAL_CAPSULE | Freq: Three times a day (TID) | ORAL | Status: DC
  Administered 2021-04-26 – 2021-05-10 (×29): 36000 [IU] via ORAL
  Filled 2021-04-25: qty 3
  Filled 2021-04-25: qty 1
  Filled 2021-04-25: qty 3
  Filled 2021-04-25 (×11): qty 1
  Filled 2021-04-25: qty 3
  Filled 2021-04-25 (×6): qty 1
  Filled 2021-04-25: qty 3
  Filled 2021-04-25: qty 1
  Filled 2021-04-25: qty 3
  Filled 2021-04-25 (×14): qty 1

## 2021-04-25 MED ORDER — ACETAMINOPHEN 650 MG RE SUPP: 650.0000 mg | Freq: Four times a day (QID) | RECTAL | Status: DC | PRN

## 2021-04-25 MED ORDER — ONDANSETRON HCL 4 MG/2ML IJ SOLN
4.0000 mg | Freq: Once | INTRAMUSCULAR | Status: AC
  Administered 2021-04-25: 4 mg via INTRAVENOUS
  Filled 2021-04-25: qty 2

## 2021-04-25 MED ORDER — ALBUTEROL SULFATE (2.5 MG/3ML) 0.083% IN NEBU
2.5000 mg | INHALATION_SOLUTION | RESPIRATORY_TRACT | Status: DC | PRN
  Administered 2021-04-29 – 2021-05-02 (×5): 2.5 mg via RESPIRATORY_TRACT
  Filled 2021-04-25 (×5): qty 3

## 2021-04-25 MED ORDER — FERROUS SULFATE 325 (65 FE) MG PO TABS
325.0000 mg | ORAL_TABLET | Freq: Every day | ORAL | Status: DC
  Filled 2021-04-25 (×2): qty 1

## 2021-04-25 MED ORDER — CHOLESTYRAMINE LIGHT 4 G PO PACK
4.0000 g | PACK | Freq: Two times a day (BID) | ORAL | Status: DC
  Administered 2021-04-26 – 2021-04-27 (×2): 4 g via ORAL
  Filled 2021-04-25 (×9): qty 1

## 2021-04-25 MED ORDER — DEXTROSE 50 % IV SOLN
1.0000 | INTRAVENOUS | Status: DC | PRN
  Administered 2021-04-25 – 2021-04-26 (×8): 50 mL via INTRAVENOUS
  Filled 2021-04-25 (×7): qty 50

## 2021-04-25 MED ORDER — MEGESTROL ACETATE 400 MG/10ML PO SUSP
400.0000 mg | Freq: Every day | ORAL | Status: DC
  Filled 2021-04-25: qty 10

## 2021-04-25 MED ORDER — PIPERACILLIN-TAZOBACTAM IN DEX 2-0.25 GM/50ML IV SOLN
2.2500 g | Freq: Three times a day (TID) | INTRAVENOUS | Status: DC
  Administered 2021-04-25 – 2021-04-26 (×2): 2.25 g via INTRAVENOUS
  Filled 2021-04-25 (×3): qty 50

## 2021-04-25 MED ORDER — SODIUM CHLORIDE 0.9 % IV SOLN
1.0000 g | Freq: Once | INTRAVENOUS | Status: AC
  Administered 2021-04-25: 1 g via INTRAVENOUS
  Filled 2021-04-25: qty 10

## 2021-04-25 MED ORDER — DEXTROSE 50 % IV SOLN
INTRAVENOUS | Status: AC
  Administered 2021-04-25: 50 mL
  Filled 2021-04-25: qty 50

## 2021-04-25 MED ORDER — VITAMIN B-12 1000 MCG PO TABS
1000.0000 ug | ORAL_TABLET | Freq: Every day | ORAL | Status: DC
  Filled 2021-04-25: qty 1

## 2021-04-25 MED ORDER — IOHEXOL 9 MG/ML PO SOLN
500.0000 mL | ORAL | Status: AC
Start: 2021-04-25 — End: 2021-04-25
  Administered 2021-04-25: 500 mL via ORAL

## 2021-04-25 MED ORDER — ATORVASTATIN CALCIUM 20 MG PO TABS: 20.0000 mg | ORAL_TABLET | Freq: Every evening | ORAL | Status: DC

## 2021-04-25 NOTE — ED Triage Notes (Signed)
Pt comes into the ED via [facility] from home c/o fever, body aches that started today.  Pt is a dialysis patient and did receive a full treatment yesterday.  Temp 102.3, CB?G 138, 132/84, 99% 2 L (baseline)

## 2021-04-25 NOTE — Consult Note (Signed)
Pharmacy Antibiotic Note  [name] is a 60 y.o. female admitted on 04/25/2021 with fever myalgias and abdominal pain. Patient with Hx of ESRD on HD MWF with last session Wed 04/24/21 Pharmacy has been consulted for Vancomycin and Zosyn dosing. Recent Urine cultures 03/2021 growing ESBL E. Coli   Plan: Initiate Zosyn 2.25 gram Q8H Vancomycin 1250 mg LD x 1  Vancomycin 500 mg q HD session Goal pre-HD random 15-25 Obtain random level prior to 2nd-3rd HD session Follow cultures, HD schedule  Height: 5\' 5"  (165.1 cm) Weight: 52 kg (114 lb 10.2 oz) IBW/kg (Calculated) : 57  Temp (24hrs), Avg:103.2 F (39.6 C), Min:103.2 F (39.6 C), Max:103.2 F (39.6 C)  Recent Labs  Lab 04/25/21 1651 04/25/21 1838  WBC 5.7  --   CREATININE 3.63*  --   LATICACIDVEN 1.4 1.1    Estimated Creatinine Clearance: 13.4 mL/min (A) (by C-G formula based on SCr of 3.63 mg/dL (H)).    Allergies  Allergen Reactions   Ace Inhibitors Swelling and Anaphylaxis   Ativan [Lorazepam] Other (See Comments)    Reaction:  Hallucinations and headaches   Compazine [Prochlorperazine Edisylate] Anaphylaxis, Nausea And Vomiting and Other (See Comments)    Other reaction(s): dystonia from this vs. Reglan, 23 Jul - patient relates that she takes promethazine frequently with no problems   Sumatriptan Succinate Other (See Comments)    Other reaction(s): delirium and hallucinations per [facility] records   Zofran [Ondansetron] Nausea And Vomiting    Per pt. she is allergic to zofran or will experience adverse reaction like hallucination    Losartan Nausea Only   Prochlorperazine Other (See Comments)    Reaction:  Unknown . Patient does not remember reaction but she does have vertigo and anxiety along with n and v at times. Could be used to treat any of these    Reglan [Metoclopramide] Other (See Comments)    Per patient her Dr. Evelina Bucy her off it    Scopolamine Other (See Comments)    Dizziness, also has vertigo already   Tape  Rash    Plastic tape causes rash   Tapentadol Rash   Ultrasound Gel Itching    Patient states the Ultrasound gel caused itching while in skin contact and resolved when wiped away.    Antimicrobials this admission: 8/4 Azithromycin >>  8/4 Ceftriaxone x 1 8/4 Vancomycin >>  8/4 Zosyn >>  Dose adjustments this admission: N/a  Microbiology results: 8/4 BCx: sent 8/4 UCx: ordered    8/4 MRSA PCR: sent  Thank you for allowing pharmacy to be a part of this patient's care.  Dorothe Pea, PharmD, BCPS Clinical Pharmacist   04/25/2021 10:14 PM

## 2021-04-25 NOTE — ED Notes (Signed)
Lab called to make this nurse aware that the urine that was sent to the lab was not enough to run a urinalysis off of. The urine that was obtained was very white milky looking. Dr. Tamala Julian made aware.

## 2021-04-25 NOTE — ED Notes (Signed)
Pt noticed to be leaning and getting more lethargic.  Pt CBG checked, CBG at 14.  Pt taken to RM 10.  RN made aware and MD.

## 2021-04-25 NOTE — H&P (Signed)
History and Physical    PLEASE NOTE THAT DRAGON DICTATION SOFTWARE WAS USED IN THE CONSTRUCTION OF THIS NOTE.   [name] DOB: 1961/07/13 DOA: 04/25/2021  PCP: Denton Lank, MD Patient coming from: home   I have personally briefly reviewed patient's old medical records in [place]  Chief Complaint: Fever  HPI: [name] is a 60 y.o. female with medical history significant for end-stage renal disease on hemodialysis on Monday, Wednesday, Friday schedule, diabetes mellitus complicated by peripheral polyneuropathy and gastroparesis,, hyperlipidemia, anemia of chronic kidney disease with baseline hemoglobin 8-10, hypertension, who is admitted to [facility] on 04/25/2021 with severe sepsis after presenting from home to [facility] ED complaining of fever.   Provision of history is limited by the patient's somnolence.  Additionally, following history is provided via my discussions with the emergency department physician and via chart review.  The patient reports 2 days of subjective fever, with temperature max at home over that time of 103 F.  She notes associated crampy abdominal discomfort in the bilateral lower abdominal quadrants, which she states is similar in distribution, quality, and intensity relative to the abdominal discomfort that she was experiencing at the time of hospitalization to [facility] ED approximately 1 month ago at which time she was diagnosed with ESBL Ecoli urinary tract infection.  He confirms a history of end-stage renal disease, reporting that her hemodialysis schedule is Monday, Wednesday, Friday, with most recent hemodialysis occurring on Wednesday, 04/25/2021.  In spite of end-stage renal disease, she conveys that she continues to produce a small amount of urine on a daily basis, and notes development of cloudy appearance of her urine over the course of the last 2 days.  Denies any overt dysuria over that timeframe.  Denies any associated  nausea, vomiting, diarrhea, or new rash.  She also denies any associated chills, full body rigors, or new general myalgias.  Not associate any recent headache, neck stiffness, rhinitis, rhinorrhea, sore throat shortness of breath, wheezing, cough.  Denies any recent travel or known COVID-19 exposures.  Also denies any recent chest pain, diaphoresis, or palpitations.  Denies any recent/preceding trauma.  She also notes generalized weakness over the last 2 to 3 days, in the absence of any acute focal weakness.  She also denies any associated acute focal numbness, paresthesias, dysarthria, slurred speech, acute change in vision, dysphagia, or vertigo.  Per chart review, at the time of her hospitalization at [facility] approximately 4 weeks ago, urine culture performed on 03/29/2021 showed greater than 100,000 colonies of ESBL E. Coli that was resistant to penicillin, cephalosporins, fluoroquinolones, but was sensitive to gentamicin, imipenem, nitrofurantoin, Zosyn, and Bactrim.  Blood cultures x2 during that hospitalization demonstrated no growth.  She confirms a history of diabetes complicated by diabetic peripheral polyneuropathy as well as gastroparesis, and, per chart review, hemoglobin A1c was found to be 6.2% on 03/29/2021.  Patient conveys that she is on insulin as an outpatient, but is unsure as to the specific nature of this insulin as well as the dose/frequency.  It is unclear if she is on any oral hypoglycemic agents at home.     ED Course:  Vital signs in the ED were notable for the following: Tetramex 103.2, heart rate 81-98; blood pressure 116/52 -123/52; respiratory rate 18-22, oxygen saturation 98 to 100% on room air.  Labs were notable for the following: CMP was notable for the following: Sodium 133, potassium 4.9, bicarbonate 24, anion gap 9, glucose 46, calcium  corrected for hypoalbuminemia noted to be 9.3, albumin 2.3, and otherwise liver enzymes were found to be within normal limits.  Lipase  20.  BNP 820.  Procalcitonin 0.1.  CBC notable for white blood cell count 5700, hemoglobin 9.1 relative to most recent provider 9.7 on 04/01/2021.  Nasopharyngeal COVID-19 PCR performed in the ED today was found to be negative.  Blood cultures x2 were collected prior to initiation of any antibiotics.  Following CMP glucose of 46, the patient received an amp of D50, with ensuing improvement in glucose by Accu-Chek to 102, with repeat Accu-Chek subsequently showing glucose of less than 10, prompting an additional amp of D50, following which glucose improved to 95, with subsequent trend down to 51, and following an additional amp of D50, blood sugar subsequently increased to 106.   EKG showed sinus rhythm with heart rate 97, no evidence of T wave or ST changes, including no evidence of ST elevation.  2 view chest x-ray showed bibasilar patchy airspace opacities that also involve the lingula, felt to represent infection versus inflammation without evidence of overt pulmonary edema, also showing evidence of bilateral trace to small pleural effusions in the absence of any pneumothorax.  CT abdomen/pelvis without contrast showed a small amount of abdominal pelvic ascites, but otherwise demonstrated no evidence of acute intra-abdominal process.  While in the ED, the following were administered: In addition to the 3 A of D50 described above, the patient also received Tylenol 1 g p.o. x1, azithromycin x1 dose, Rocephin x1 dose.  Subsequently, the patient was admitted to the PCU for further evaluation management of severe sepsis and hypoglycemia.     Review of Systems: As per HPI otherwise 10 point review of systems negative.   Past Medical History:  Diagnosis Date   Anemia    Anginal pain (HCC)    Anxiety    Arthritis    Asthma    Broken wrist    Bronchitis    chronic diastolic CHF 04/05/2015   Chronic kidney disease    COPD (chronic obstructive pulmonary disease) (HCC)    Coronary artery disease    a.  cath 2013: stenting to RCA (report not available); b. cath 2014: LM nl, pLAD 40%, mLAD nl, ost LCx 40%, mid LCx nl, pRCA 30% @ site of prior stent, mRCA 50%   Depression    Diabetes mellitus (HCC)    Diabetes mellitus without complication (HCC)    Diabetic neuropathy (HCC)    dialysis 2006   Diverticulosis    Dizziness    Dyspnea    Elevated lipids    Environmental and seasonal allergies    ESRD (end stage renal disease) on dialysis (HCC)    M-W-F   Gastroparesis    GERD (gastroesophageal reflux disease)    Headache    History of anemia due to chronic kidney disease    History of hiatal hernia    HOH (hard of hearing)    Hx of pancreatitis 2015   Hypertension    Lower extremity edema    Mitral regurgitation    a. echo 10/2013: EF 62%, noWMA, mildly dilated LA, mild to mod MR/TR, GR1DD   Myocardial infarction (HCC)    Orthopnea    Parathyroid abnormality (HCC)    Peripheral arterial disease (HCC)    Pneumonia    Renal cancer (HCC)    Renal insufficiency    Pt is on dialysis on M,W + F.   Wheezing     Past Surgical History:  Procedure Laterality Date   A/V SHUNTOGRAM Left 01/20/2018   Procedure: A/V SHUNTOGRAM;  Surgeon: Algernon Huxley, MD;  Location: [place] [facility];  Service: Cardiovascular;  Laterality: Left;   ABDOMINAL HYSTERECTOMY  1992   AMPUTATION TOE Left 10/02/2017   Procedure: AMPUTATION TOE-LEFT GREAT TOE;  Surgeon: Albertine Patricia, DPM;  Location: [facility];  Service: Podiatry;  Laterality: Left;   APPENDECTOMY     APPLICATION OF WOUND VAC N/A 11/25/2019   Procedure: APPLICATION OF WOUND VAC;  Surgeon: Katha Cabal, MD;  Location: [facility];  Service: Vascular;  Laterality: N/A;   ARTERY BIOPSY Right 10/11/2018   Procedure: BIOPSY TEMPORAL ARTERY;  Surgeon: Vickie Epley, MD;  Location: [facility];  Service: General;  Laterality: Right;   CARDIAC CATHETERIZATION Left 07/26/2015   Procedure: Left Heart Cath and Coronary Angiography;  Surgeon: Dionisio David, MD;  Location: [place] [facility];  Service: Cardiovascular;  Laterality: Left;   CATARACT EXTRACTION W/ INTRAOCULAR LENS IMPLANT Right    CATARACT EXTRACTION W/PHACO Left 03/10/2017   Procedure: CATARACT EXTRACTION PHACO AND INTRAOCULAR LENS PLACEMENT ([place]);  Surgeon: Birder Robson, MD;  Location: [facility];  Service: Ophthalmology;  Laterality: Left;  [place] 00:51.9 AP% 14.2 CDE 7.39 fluid pack lot # [id number]   CENTRAL LINE INSERTION Right 11/11/2019   Procedure: CENTRAL LINE INSERTION;  Surgeon: Katha Cabal, MD;  Location: [facility];  Service: Vascular;  Laterality: Right;   CENTRAL LINE INSERTION  11/25/2019   Procedure: CENTRAL LINE INSERTION;  Surgeon: Katha Cabal, MD;  Location: [facility];  Service: Vascular;;   CHOLECYSTECTOMY     COLONOSCOPY WITH PROPOFOL N/A 08/12/2016   Procedure: COLONOSCOPY WITH PROPOFOL;  Surgeon: Lollie Sails, MD;  Location: [facility] ENDOSCOPY;  Service: Endoscopy;  Laterality: N/A;   DIALYSIS FISTULA CREATION Left    upper arm   dialysis grafts     DIALYSIS/PERMA CATHETER INSERTION N/A 11/14/2019   Procedure: DIALYSIS/PERMA CATHETER INSERTION;  Surgeon: Algernon Huxley, MD;  Location: [place] [facility];  Service: Cardiovascular;  Laterality: N/A;   DIALYSIS/PERMA CATHETER INSERTION N/A 02/03/2020   Procedure: DIALYSIS/PERMA CATHETER INSERTION;  Surgeon: Katha Cabal, MD;  Location: [place] [facility];  Service: Cardiovascular;  Laterality: N/A;   DIALYSIS/PERMA CATHETER INSERTION N/A 06/19/2020   Procedure: DIALYSIS/PERMA CATHETER INSERTION;  Surgeon: Katha Cabal, MD;  Location: [place] [facility];  Service: Cardiovascular;  Laterality: N/A;   DIALYSIS/PERMA CATHETER REMOVAL N/A 05/25/2020   Procedure: DIALYSIS/PERMA CATHETER REMOVAL;  Surgeon: Katha Cabal, MD;  Location: [place] [facility];  Service: Cardiovascular;  Laterality: N/A;   ESOPHAGOGASTRODUODENOSCOPY N/A 03/08/2015   Procedure: ESOPHAGOGASTRODUODENOSCOPY  (EGD);  Surgeon: Manya Silvas, MD;  Location: [facility] ENDOSCOPY;  Service: Endoscopy;  Laterality: N/A;   ESOPHAGOGASTRODUODENOSCOPY (EGD) WITH PROPOFOL N/A 03/18/2016   Procedure: ESOPHAGOGASTRODUODENOSCOPY (EGD) WITH PROPOFOL;  Surgeon: [name], MD;  Location: [facility] ENDOSCOPY;  Service: Endoscopy;  Laterality: N/A;   EYE SURGERY Right 2018   FECAL TRANSPLANT N/A 08/23/2015   Procedure: FECAL TRANSPLANT;  Surgeon: Manya Silvas, MD;  Location: [facility] ENDOSCOPY;  Service: Endoscopy;  Laterality: N/A;   HAND SURGERY Bilateral    HEMATOMA EVACUATION Left 11/25/2019   Procedure: EVACUATION HEMATOMA;  Surgeon: Katha Cabal, MD;  Location: [facility];  Service: Vascular;  Laterality: Left;   I & D EXTREMITY Left 11/25/2019   Procedure: IRRIGATION AND DEBRIDEMENT EXTREMITY;  Surgeon: Katha Cabal, MD;  Location: [facility];  Service: Vascular;  Laterality: Left;   IR FLUORO GUIDE CV LINE RIGHT  04/06/2020   IR INJECT/THERA/INC NEEDLE/CATH/PLC EPI/CERV/THOR W/IMG  08/13/2020   IR RADIOLOGIST EVAL & MGMT  07/28/2019   IR RADIOLOGIST EVAL & MGMT  08/11/2019   LIGATION OF ARTERIOVENOUS  FISTULA Left 11/11/2019   LIGATION OF ARTERIOVENOUS  FISTULA Left 11/11/2019   Procedure: LIGATION OF ARTERIOVENOUS  FISTULA;  Surgeon: Katha Cabal, MD;  Location: [facility];  Service: Vascular;  Laterality: Left;   LIGATIONS OF HERO GRAFT Right 06/13/2020   Procedure: LIGATION / REMOVAL OF RIGHT HERO GRAFT;  Surgeon: Katha Cabal, MD;  Location: [facility];  Service: Vascular;  Laterality: Right;   PERIPHERAL VASCULAR CATHETERIZATION N/A 12/20/2015   Procedure: Thrombectomy of dialysis access versus permcath placement;  Surgeon: Algernon Huxley, MD;  Location: [place] [facility];  Service: Cardiovascular;  Laterality: N/A;   PERIPHERAL VASCULAR CATHETERIZATION N/A 12/20/2015   Procedure: A/V Shunt Intervention;  Surgeon: Algernon Huxley, MD;  Location: [place] [facility];  Service: Cardiovascular;  Laterality:  N/A;   PERIPHERAL VASCULAR CATHETERIZATION N/A 12/20/2015   Procedure: A/V Shuntogram/Fistulagram;  Surgeon: Algernon Huxley, MD;  Location: [place] [facility];  Service: Cardiovascular;  Laterality: N/A;   PERIPHERAL VASCULAR CATHETERIZATION N/A 01/02/2016   Procedure: A/V Shuntogram/Fistulagram;  Surgeon: Algernon Huxley, MD;  Location: [place] [facility];  Service: Cardiovascular;  Laterality: N/A;   PERIPHERAL VASCULAR CATHETERIZATION N/A 01/02/2016   Procedure: A/V Shunt Intervention;  Surgeon: Algernon Huxley, MD;  Location: [place] [facility];  Service: Cardiovascular;  Laterality: N/A;   TEE WITHOUT CARDIOVERSION N/A 06/11/2020   Procedure: TRANSESOPHAGEAL ECHOCARDIOGRAM (TEE);  Surgeon: Teodoro Spray, MD;  Location: [facility];  Service: Cardiovascular;  Laterality: N/A;   UPPER EXTREMITY ANGIOGRAPHY Bilateral 11/27/2020   Procedure: UPPER EXTREMITY ANGIOGRAPHY;  Surgeon: Katha Cabal, MD;  Location: [place] [facility];  Service: Cardiovascular;  Laterality: Bilateral;   UPPER EXTREMITY VENOGRAPHY Right 01/18/2020   Procedure: UPPER EXTREMITY VENOGRAPHY;  Surgeon: Katha Cabal, MD;  Location: [place] [facility];  Service: Cardiovascular;  Laterality: Right;   UPPER EXTREMITY VENOGRAPHY Bilateral 11/27/2020   Procedure: UPPER EXTREMITY VENOGRAPHY;  Surgeon: Katha Cabal, MD;  Location: [place] [facility];  Service: Cardiovascular;  Laterality: Bilateral;   VASCULAR ACCESS DEVICE INSERTION Right 04/13/2020   Procedure: INSERTION OF HERO VASCULAR ACCESS DEVICE (GRAFT);  Surgeon: Katha Cabal, MD;  Location: [facility];  Service: Vascular;  Laterality: Right;    Social History:  reports that she has never smoked. She has never used smokeless tobacco. She reports previous alcohol use. She reports current drug use. Drug: Marijuana.   Allergies  Allergen Reactions   Ace Inhibitors Swelling and Anaphylaxis   Ativan [Lorazepam] Other (See Comments)    Reaction:  Hallucinations  and headaches   Compazine [Prochlorperazine Edisylate] Anaphylaxis, Nausea And Vomiting and Other (See Comments)    Other reaction(s): dystonia from this vs. Reglan, 23 Jul - patient relates that she takes promethazine frequently with no problems   Sumatriptan Succinate Other (See Comments)    Other reaction(s): delirium and hallucinations per [facility] records   Zofran [Ondansetron] Nausea And Vomiting    Per pt. she is allergic to zofran or will experience adverse reaction like hallucination    Losartan Nausea Only   Prochlorperazine Other (See Comments)    Reaction:  Unknown . Patient does not remember reaction but she does have vertigo and anxiety along with n  and v at times. Could be used to treat any of these    Reglan [Metoclopramide] Other (See Comments)    Per patient her Dr. Evelina Bucy her off it    Scopolamine Other (See Comments)    Dizziness, also has vertigo already   Tape Rash    Plastic tape causes rash   Tapentadol Rash   Ultrasound Gel Itching    Patient states the Ultrasound gel caused itching while in skin contact and resolved when wiped away.    Family History  Problem Relation Age of Onset   Kidney disease Mother    Diabetes Mother    Cancer Father    Kidney disease Sister     Family history reviewed and not pertinent    Prior to Admission medications   Medication Sig Start Date End Date Taking? Authorizing Provider  albuterol (PROVENTIL) (2.5 MG/3ML) 0.083% nebulizer solution Take 2.5 mg by nebulization every 4 (four) hours as needed for wheezing or shortness of breath.    [provider]  albuterol (VENTOLIN HFA) 108 (90 Base) MCG/ACT inhaler Inhale 2 puffs into the lungs every 4 (four) hours as needed for wheezing or shortness of breath. 11/09/19   [provider]  alum & mag hydroxide-simeth (MAALOX/MYLANTA) 200-200-20 MG/5ML suspension Take 15 mLs by mouth every 6 (six) hours as needed for indigestion or heartburn. 06/25/20   Enzo Bi, MD   amLODipine (NORVASC) 10 MG tablet Take 1 tablet (10 mg total) by mouth daily. 06/25/20   Enzo Bi, MD  ammonium lactate (LAC-HYDRIN) 12 % lotion Apply 1 application topically 2 (two) times daily as needed for dry skin.     [provider]  Ascorbic Acid (VITA-C PO) Take 1 tablet by mouth daily.    [provider]  aspirin EC 81 MG tablet Take 81 mg by mouth daily.    [provider]  atorvastatin (LIPITOR) 20 MG tablet Take 20 mg by mouth every evening.  10/24/19   [provider]  Biotin 1 MG CAPS Take 1 tablet by mouth daily.    [provider]  calcium carbonate (TUMS - DOSED IN MG ELEMENTAL CALCIUM) 500 MG chewable tablet Chew 2.5 tablets (500 mg of elemental calcium total) by mouth every 6 (six) hours as needed for indigestion. 06/25/20   Enzo Bi, MD  Cholecalciferol (VITAMIN D3) 25 MCG (1000 UT) CAPS Take 1,000 Units by mouth daily.     [provider]  cholestyramine light (PREVALITE) 4 g packet Take 4 g by mouth 2 (two) times daily.    [provider]  CREON 12000 units CPEP capsule Take 36,000 Units by mouth 3 (three) times daily before meals.  10/21/19   [provider]  cyanocobalamin 1000 MCG tablet Take 1,000 mcg by mouth daily.     [provider]  diphenhydrAMINE (BENADRYL) 25 MG tablet Take 25 mg by mouth every 6 (six) hours as needed for itching.    [provider]  epoetin alfa (EPOGEN) 10000 UNIT/ML injection Inject 1 mL (10,000 Units total) into the vein every Monday, Wednesday, and Friday with hemodialysis. 06/27/20   Enzo Bi, MD  Ferrous Sulfate (IRON) 325 (65 Fe) MG TABS Take 325 mg by mouth daily.     [provider]  furosemide (LASIX) 80 MG tablet Take 1 tablet by mouth daily. 08/13/20   [provider]  gabapentin (NEURONTIN) 100 MG capsule Take 100 mg by mouth every evening.     [provider]  hydrALAZINE (APRESOLINE) 50 MG tablet Take 1 tablet (50 mg  total) by mouth every 8 (eight) hours. 06/25/20   Enzo Bi, MD  HYDROcodone-acetaminophen (NORCO/VICODIN) 5-325 MG tablet Take 1 tablet by mouth 3 (three) times daily as needed for moderate pain or severe pain. 12/17/20   Lorella Nimrod, MD  hydrOXYzine (ATARAX/VISTARIL) 25 MG tablet Take 25 mg by mouth daily.     [provider]  lidocaine (LIDODERM) 5 % SMARTSIG:Topical 04/02/21   [provider]  meclizine (ANTIVERT) 25 MG tablet Take 25 mg by mouth 4 (four) times daily as needed for dizziness.    [provider]  megestrol (MEGACE) 400 MG/10ML suspension Take 10 mLs (400 mg total) by mouth daily. 09/21/20   Shelly Coss, MD  multivitamin (RENA-VIT) TABS tablet Take 1 tablet by mouth daily.     [provider]  mupirocin ointment (BACTROBAN) 2 % Apply 1 application topically daily as needed (leg rash).  11/09/19   [provider]  nitroGLYCERIN (NITROSTAT) 0.4 MG SL tablet Place 0.4 mg under the tongue every 5 (five) minutes as needed for chest pain.     [provider]  Nutritional Supplements (FEEDING SUPPLEMENT, NEPRO CARB STEADY,) LIQD Take 237 mLs by mouth 2 (two) times daily between meals. 12/17/20   Lorella Nimrod, MD  Omega-3 300 MG CAPS Take 300 mg by mouth 2 (two) times daily. Every other day opposite omega XL    [provider]  ondansetron (ZOFRAN) 4 MG tablet Take 1 tablet (4 mg total) by mouth every 8 (eight) hours as needed for nausea or vomiting. 09/21/20 09/21/21  Shelly Coss, MD  pantoprazole (PROTONIX) 40 MG tablet Take 40 mg by mouth daily. 08/28/19   [provider]  polyethylene glycol (MIRALAX / GLYCOLAX) 17 g packet Take 17 g by mouth daily as needed. 04/02/21   Nolberto Hanlon, MD  Probiotic Product (PROBIOTIC PO) Take 1 capsule by mouth daily.    [provider]  sevelamer carbonate (RENVELA) 800 MG tablet Take 800 mg by mouth 4 (four) times daily. (at meals and with a snack)    [provider]  vitamin E 180 MG (400 UNITS) capsule Take 400 Units by mouth daily.    [provider]     Objective    Physical Exam: Vitals:   04/25/21 1602 04/25/21 1609 04/25/21 1843 04/25/21 1852  BP:   (!) 116/52 (!) 123/52  Pulse: 98  84 81  Resp: (!) 22   18  Temp: (!) 103.2 F (39.6 C)     TempSrc: Oral     SpO2: 98%  91% 100%  Weight:  52 kg    Height:  $Remove'5\' 5"'VceqVUH$  (1.651 m)      General: appears to be stated age; somnolent, will briefly open eyes to verbal stimuli, able to appropriately respond to questions posterior via 4-5 word response before falling back asleep; in this capacity, unable to fully follow instructions at this time. Skin: warm, dry, no rash Head:  AT/[place] Mouth:  Oral mucosa membranes appear moist, normal dentition Neck: supple; trachea midline Heart:  RRR; did not appreciate any M/R/G Lungs: CTAB, did not appreciate any wheezes, rales, or rhonchi Abdomen: + BS; soft, ND, NT Vascular: 2+ pedal pulses b/l; 2+ radial pulses b/l Extremities: Trace to 1+ edema in the bilateral lower extremities ; no muscle wasting Neuro: In the setting of the patient's current mental status and associated inability to follow instructions, unable to perform full neurologic  exam at this time.  As such, assessment of strength, sensation, and cranial nerves is limited at this time. Patient noted to spontaneously move all 4 extremities. No tremors.    Labs on Admission: I have personally reviewed following labs and imaging studies  CBC: Recent Labs  Lab 04/25/21 1651  WBC 5.7  NEUTROABS 4.5  HGB 9.1*  HCT 28.8*  MCV 89.7  PLT 161   Basic Metabolic Panel: Recent Labs  Lab 04/25/21 1651  NA 133*  K 4.9  CL 100  CO2 24  GLUCOSE 46*  BUN 14  CREATININE 3.63*  CALCIUM 8.0*  MG 1.7   GFR: Estimated Creatinine Clearance: 13.4 mL/min (A) (by C-G formula based on SCr of 3.63 mg/dL (H)). Liver Function Tests: Recent Labs  Lab 04/25/21 1651  AST 23  ALT 14   ALKPHOS 137*  BILITOT 0.5  PROT 7.6  ALBUMIN 2.3*   Recent Labs  Lab 04/25/21 1651  LIPASE 20   No results for input(s): AMMONIA in the last 168 hours. Coagulation Profile: Recent Labs  Lab 04/25/21 1651  INR 1.2   Cardiac Enzymes: No results for input(s): CKTOTAL, CKMB, CKMBINDEX, TROPONINI in the last 168 hours. BNP (last 3 results) No results for input(s): PROBNP in the last 8760 hours. HbA1C: No results for input(s): HGBA1C in the last 72 hours. CBG: Recent Labs  Lab 04/25/21 1843 04/25/21 1858  GLUCAP 14* 102*   Lipid Profile: No results for input(s): CHOL, HDL, LDLCALC, TRIG, CHOLHDL, LDLDIRECT in the last 72 hours. Thyroid Function Tests: No results for input(s): TSH, T4TOTAL, FREET4, T3FREE, THYROIDAB in the last 72 hours. Anemia Panel: No results for input(s): VITAMINB12, FOLATE, FERRITIN, TIBC, IRON, RETICCTPCT in the last 72 hours. Urine analysis:    Component Value Date/Time   COLORURINE YELLOW (A) 03/29/2021 0020   APPEARANCEUR TURBID (A) 03/29/2021 0020   APPEARANCEUR Clear 09/15/2014 0947   LABSPEC 1.010 03/29/2021 0020   LABSPEC 1.008 09/15/2014 0947   PHURINE 5.0 03/29/2021 0020   GLUCOSEU >=500 (A) 03/29/2021 0020   GLUCOSEU 150 mg/dL 09/15/2014 0947   HGBUR MODERATE (A) 03/29/2021 0020   BILIRUBINUR NEGATIVE 03/29/2021 0020   BILIRUBINUR Negative 09/15/2014 0947   KETONESUR NEGATIVE 03/29/2021 0020   PROTEINUR 100 (A) 03/29/2021 0020   UROBILINOGEN 0.2 06/30/2010 2130   NITRITE NEGATIVE 03/29/2021 0020   LEUKOCYTESUR LARGE (A) 03/29/2021 0020   LEUKOCYTESUR Negative 09/15/2014 0947    Radiological Exams on Admission: CT ABDOMEN PELVIS WO CONTRAST  Result Date: 04/25/2021 CLINICAL DATA:  Diffuse abdominal pain EXAM: CT ABDOMEN AND PELVIS WITHOUT CONTRAST TECHNIQUE: Multidetector CT imaging of the abdomen and pelvis was performed following the standard protocol without IV contrast. COMPARISON:  CT 06/16/2020 FINDINGS: Lower chest: Lung  bases demonstrate small bilateral pleural effusions and cardiomegaly. Hazy lung densities at the bases likely due to edema. Atelectasis at both bases. Hepatobiliary: Status post cholecystectomy. No focal hepatic abnormality. No biliary dilatation. Pancreas: Unremarkable. No pancreatic ductal dilatation or surrounding inflammatory changes. Spleen: Normal in size without focal abnormality. Adrenals/Urinary Tract: Adrenal glands are normal. Atrophic native kidneys without hydronephrosis. Stable cyst in the mid left kidney. The bladder is nearly empty Stomach/Bowel: The stomach is nonenlarged. No dilated small bowel. No acute bowel wall thickening. Diverticular disease of left colon without acute wall thickening Vascular/Lymphatic: Extensive vascular calcification. Mild retroperitoneal lymph nodes measuring up to 12 mm Reproductive: Status post hysterectomy. No adnexal masses. Other: Negative for free air. Small free fluid within the abdomen and pelvis. Extensive  edema within the subcutaneous soft tissues. Musculoskeletal: No acute osseous abnormality IMPRESSION: 1. Cardiomegaly with small bilateral pleural effusions and hazy lung densities likely due to edema. Small amount of abdominopelvic ascites. Extensive edema within the subcutaneous soft tissues consistent with anasarca. 2. Native atrophic kidneys without hydronephrosis 3. Negative for bowel obstruction or bowel wall thickening. Left colon diverticular disease without acute wall thickening Electronically Signed   By: Donavan Foil M.D.   On: 04/25/2021 20:38   DG Chest 2 View  Result Date: 04/25/2021 CLINICAL DATA:  Suspected sepsis. EXAM: CHEST - 2 VIEW COMPARISON:  Chest x-ray 03/29/2021, CT chest 08/10/2020 FINDINGS: Left dialysis catheter with tips overlying the right atrium. Left subclavian vascular stent again noted in similar position. Slightly more prominent cardiac silhouette which may be due to technique in a patient with an AP sitting radiograph.  Otherwise the heart size and mediastinal contours are unchanged. No definite pericardial effusion on lateral view with limited evaluation. Question coronary stent. Bibasilar patchy airspace opacities including of the lingula. Nodule like density on lateral view. Coarsened interstitial markings with no overt pulmonary edema. Persistent bilateral trace to small volume pleural effusions. No pneumothorax. No acute osseous abnormality. IMPRESSION: 1. Persistent bilateral trace to small volume pleural effusions with associated patchy airspace opacities of bilateral lobe lower lobes and lingula likely representing infection/inflammation. Nodular-like density on lateral view in the lower lobe. Followup PA and lateral chest X-ray is recommended in 3-4 weeks following therapy to ensure resolution and exclude underlying malignancy. 2. Cardiomegaly with slightly more prominent cardiac silhouette which may be due to technique in a patient with an AP sitting radiograph. Electronically Signed   By: Iven Finn M.D.   On: 04/25/2021 17:58     EKG: Independently reviewed, with result as described above.    Assessment/Plan   In the setting of the patient's current mental status and associated inability to follow instructions, unable to perform full neurologic exam at this time.  As such, assessment of strength, sensation, and cranial nerves is limited at this time. Patient noted to spontaneously move all 4 extremities. No tremors.    Principal Problem:   Severe sepsis ([place]) Active Problems:   Diabetes mellitus, type II ([place])   Hypoglycemia associated with diabetes ([place])   Generalized weakness   ESRD (end stage renal disease) ([place])   GERD (gastroesophageal reflux disease)   Acute metabolic encephalopathy   HCAP (healthcare-associated pneumonia)    #) Severe sepsis due to suspected HCAP: SIRS criteria met via presenting objective fever, and tachypnea, suspected underlying bacterial pneumonia on the basis of  presenting two-view chest x-ray showing interval development of bibasilar/lingula patchy airspace opacities consistent with infection versus inflammation without overt evidence of pulmonary edema, with elevated presenting procalcitonin level, as further quantified above.  As the patient was hospitalized last month, criteria are met for her pneumonia to be considered HCAP in nature, warranting broad-spectrum IV antibiotics.  As such, will initiate Zosyn and IV vancomycin. patient sepsis meets criteria to be considered severe in nature on the basis of associated presenting acute encephalopathy, as further detailed below.  Of note, presenting lactate nonelevated, and no evidence of hypotension thus far.  Therefore, In the absence of lactic acid level that is greater than or equal to 4.0, and in the absence of any associated hypotension, there are no indications for administration of a 30 mL/kg IVF bolus at this time.  In addition to suspected HCAP, there is also the possibility of concomitant urinary tract infection given  the patient's report of new development of cloudy appearance of her urine over the last 2 to 3 days, concomitant with development of crampy abdominal discomfort in the bilateral lower quadrants similar to that which she was experiencing at the time of most recent prior diagnosis of UTI.  Of note, she has a history of ESBL E. coli urinary tract infection approximately 1 month ago, as further detailed above, with sensitivities as further detailed above.  Of note, per sensitivities associated with the greater than 100,000 colonies of ESBL E. coli from urine culture on 03/29/2021, the Zosyn has been selected as component of HCAP management should also provide coverage for similar ESBL UTI.  Unfortunately, unable to accurately identify if the patient does currently possess a new urinary tract infection, as the cloudy urine specimen that she was able to reduce this evening was of insufficient volume in order  to formally run a urinalysis.  Of note, blood cultures x2 were collected prior to initiation of IV antibiotics in the ED.  Nasopharyngeal COVID-19/influenza PCR were checked this evening and found to be negative.  Of note, CT abdomen/pelvis performed this evening showed small abdominopelvic ascites, but otherwise no evidence of acute intra-abdominal process, while liver enzymes, with the exception of hypoalbuminemia, are found to be within normal limits, not associate with any evidence of cholestatic process.   Plan: Follow for results blood cultures x2 collected this evening.  Zosyn and IV vancomycin, as above.  Check MRSA PCR, which, if negative, can discontinue IV vancomycin due to the associated high negative predictive value for MRSA pneumonia associate with such a finding.  Monitor continuous pulse oximetry.  Repeat CBC with differential in the morning.  As needed acetaminophen.      #) Hypoglycemia: Noted to be hypoglycemic in the ED this evening, with transient improvement via as needed amps of D50, followed by refractory hypoglycemia, all of which appears to correlate with at least a component of the patient's somnolence.  Given the effective nature, albeit transient, as needed amps of D50, will start D10 half-normal saline at 50 cc/h with close monitoring of ensuing blood sugar via every hour Accu-Cheks x6 occurrences, following which I have ordered Accu-Cheks every 2 hours indefinitely. The frequency of ensuing Accu-Cheks can be further modified depending upon ensuing clinical course and trend in blood sugars.  Refraining from any sliding scale insulin orders.  Of note, the patient is due for her next routine scheduled hemodialysis session tomorrow morning (04/26/21), which should assist with removal of any additional volume conveyed by his continuous IV fluids, with plan to closely monitor for evidence of volume overload overnight.  Overnight, the patient is currently maintaining oxygen  saturations in the high 90s on room air, and does not appear to be in any respiratory distress.  Of note, this is in the context of a documented history of type 2 diabetes mellitus, with most recent hemoglobin A1c noted to be 6.2% on 03/29/2021.  Inpatient pharmacist consulted to assist with reconciling whether or not the patient is on any insulin or oral hypoglycemic agents as an outpatient, with ensuing consult revealing that the patient has no insulin or oral hypoglycemic agents at home.  It appears that her diabetes is completely managed via lifestyle modifications.  Consequently, suspect increased risk for development of hypoglycemia due to exacerbation from severe sepsis superimposed on potential brittle blood sugars as a consequence of underlying end-stage renal disease.   Plan: D10 half NS $Remov'@50'gHYZAf$  cc/h.  Accu-Cheks every hour x6 occurrences  followed by Accu-Cheks every 2 hours, which have been ordered indefinitely.  Refraining from sliding scale insulin orders.  Further evaluation management of presenting severe sepsis, as above.  Repeat BMP in the morning.      #) acute metabolic encephalopathy: Increased somnolence over the last 1 to 2 days, which appears to be a consequence of multiple metabolic factors, including contributions from presenting severe sepsis and refractory hypoglycemia, as further detailed above.  No additional electrolyte/metabolic contributing factors identified at this time.  Unlikely to be uremic given good compliance with routine scheduled hemodialysis as an outpatient, including most recent HD session yesterday.  While likely not a primary factor, it is possible there may be some secondary pharmacologic contributions to the patient's presenting altered mental status from multiple central acting medications, including scheduled gabapentin as well as as needed Norco.  Presentation does not appear to be associated with any acute focal neurologic deficits at this time, the patient  appears to be spontaneously moving all 4 extremities, thereby reducing the likelihood of acute CVA.  No evidence to suggest seizures.  Plan: Further evaluation management of presenting severe sepsis as well as hypoglycemia, as further detailed above.  Hold home gabapentin and as needed Norco for now.  Check VBG, ammonia, ionized calcium, MMA, and TSH.  Refraining from urinary drug screen due to insufficient urinary volume at this time.  Repeat CMP and CBC in the morning.      #) Generalized weakness: 2-3 days of generalized weakness in the absence of any associated acute focal neurologic deficits to suggest acute CVA.  Suspect contributions from physiologic stressors consequence of presenting severe sepsis as well as refractory hypoglycemia, as above.  Plan: Further evaluation management of severe sepsis and hypoglycemia, as above.  Check TSH and MMA.  Episode order for physical therapy consult for the morning.      #) End-stage renal disease: On hemodialysis on Monday, Weds, Friday schedule, with most recent hemodialysis session having occurred on Wednesday, 04/24/2021.  Consequently, her next scheduled hemodialysis session should occur on 04/26/2021.  No clinical evidence to warrant urgent overnight hemodialysis at this time.   Plan: I will consult on-call nephrology for assistance in arranging for scheduled hemodialysis tomorrow morning.  Monitor strict I's and O's and daily weights.  Repeat CMP in the morning.  Check serum magnesium and phosphorus levels.  Continue home Renvela.      #) Anemia of chronic kidney disease: Documented history of such, with associated sodium clodronate 10, with presenting hemoglobin found to be within this range.  No evidence of active bleed at this time.  Of note, the patient receives Epogen injections with her hemodialysis sessions.   Plan: Repeat CBC in the morning.  Nephrology consultation for scheduled hemodialysis is tomorrow morning, as further detailed  above.       #) Hypertension: Outpatient and hypertensive regimen consists of Norvasc as well as hydralazine.  In the setting of presenting severe sepsis, will hold home antihypertensive medications for now.   Plan: Hold Norvasc and hydralazine for now, as above.  Close monitoring of ensuing blood pressure via routine vital signs.       #) GERD: On Protonix as an outpatient.  We will hold home PPI for now due to potential encephalopathic consequences of this medication, in the context of presenting acute encephalopathy.  Plan: Hold home PPI for now, as above.     DVT prophylaxis: scd's  Code Status: Full code Family Communication: none Disposition Plan: Per Rounding Team  Consults called: I will consult on-call nephrology;  Admission status: inpatient; pcu     Of note, this patient was added by me to the following Admit List/Treatment Team: [facility].      PLEASE NOTE THAT DRAGON DICTATION SOFTWARE WAS USED IN THE CONSTRUCTION OF THIS NOTE.   [facility] Pager [phone] From [place]  Otherwise, please contact night-coverage  [email or web address] Password [facility]   04/25/2021, 9:28 PM

## 2021-04-25 NOTE — ED Triage Notes (Signed)
Pt from home via [facility], pt with c/o fever and bodyaches. Pt had dialysis yesterday. Pt states she feels cold. Pt alert and oriented x4.

## 2021-04-25 NOTE — ED Notes (Signed)
Blood draw and IV insertion attempted x2 by this RN and x1 by tech without success,  Lab called to draw blood.

## 2021-04-25 NOTE — ED Provider Notes (Signed)
[facility] Emergency Department Provider Note ____________________________________________   Event Date/Time   First MD Initiated Contact with Patient 04/25/21 1849     (approximate)  I have reviewed the triage vital signs and the nursing notes.  HISTORY  Chief Complaint Generalized Body Aches   HPI [name] is a 60 y.o. femalewho presents to the ED for evaluation of fever myalgias and abdominal pain.  Chart review indicates ESRD on hemodialysis TTS .  DM, diastolic heart failure, CAD and PAD.  She presents from home via EMS for evaluation of fever and abdominal pain.   Patient presents from home via EMS for evaluation of diffuse myalgias, subjective fevers and abdominal pain.  She reports that she was dialyzed yesterday and finished a full treatment. She reports about 2 days of feeling poorly in a generalized fashion with associated nausea and abdominal discomfort.  She denies any significant urinary output or dysuria.  Denies shortness of breath or cough.  Reports her breathing feels fine.  Denies syncopal episodes or chest pain.   Past Medical History:  Diagnosis Date   Anemia    Anginal pain (HCC)    Anxiety    Arthritis    Asthma    Broken wrist    Bronchitis    chronic diastolic CHF 03/19/3150   Chronic kidney disease    COPD (chronic obstructive pulmonary disease) ([place])    Coronary artery disease    a. cath 2013: stenting to RCA (report not available); b. cath 2014: LM nl, pLAD 40%, mLAD nl, ost LCx 40%, mid LCx nl, pRCA 30% @ site of prior stent, mRCA 50%   Depression    Diabetes mellitus ([place])    Diabetes mellitus without complication ([place])    Diabetic neuropathy ([place])    dialysis 2006   Diverticulosis    Dizziness    Dyspnea    Elevated lipids    Environmental and seasonal allergies    ESRD (end stage renal disease) on dialysis ([place])    M-W-F   Gastroparesis    GERD (gastroesophageal reflux disease)    Headache    History of  anemia due to chronic kidney disease    History of hiatal hernia    HOH (hard of hearing)    Hx of pancreatitis 2015   Hypertension    Lower extremity edema    Mitral regurgitation    a. echo 10/2013: EF 62%, noWMA, mildly dilated LA, mild to mod MR/TR, GR1DD   Myocardial infarction ([place])    Orthopnea    Parathyroid abnormality ([place])    Peripheral arterial disease ([place])    Pneumonia    Renal cancer ([place])    Renal insufficiency    Pt is on dialysis on M,W + F.   Wheezing     Patient Active Problem List   Diagnosis Date Noted   Acute metabolic encephalopathy 76/16/0737   UTI (urinary tract infection) 03/29/2021   Chronic respiratory failure with hypoxia ([place]) 12/18/2020   Superior vena caval syndrome 12/14/2020   Chronic diastolic CHF (congestive heart failure) ([place]) 12/04/2020   Bradycardia 09/17/2020   Advanced care planning/counseling discussion    Discitis of thoracic region    Fluid overload 08/10/2020   HLD (hyperlipidemia) 08/10/2020   Chest pain 08/10/2020   CAD (coronary artery disease) 08/10/2020   GERD (gastroesophageal reflux disease) 08/10/2020   Obesity (BMI 30-39.9) 06/06/2020   Anemia in ESRD (end-stage renal disease) ([place]) 05/31/2020   Type 1 diabetes mellitus ([place]) 05/31/2020  End-stage renal disease ([place]) 05/31/2020   Hypertensive disorder 05/31/2020   Arm pain 05/21/2020   Cellulitis of right leg 05/02/2020   ESRD (end stage renal disease) ([place]) 04/13/2020   SOBOE (shortness of breath on exertion) 02/08/2020   Hematoma of arm, left, subsequent encounter 11/24/2019   Respiratory failure (HCC)    Shortness of breath    Complication of arteriovenous dialysis fistula 11/13/2019   PVD (peripheral vascular disease) ([place]) 11/13/2019   Severe anemia 11/13/2019   Pressure injury of skin 11/13/2019   ESRD on hemodialysis ([place])    Hemorrhagic shock ([employer]) 11/11/2019   Leg pain 10/02/2019   CAP (community acquired pneumonia) 10/01/2019   Atherosclerosis of  native arteries of extremity with intermittent claudication ([place]) 05/01/2019   Right-sided headache    COPD (chronic obstructive pulmonary disease) ([place]) 10/06/2018   Syncope 10/04/2018   Symptomatic anemia 06/18/2018   Gastroparesis due to DM ([place]) 56/31/4970   Complication of vascular access for dialysis 12/04/2017   Osteomyelitis ([place]) 09/30/2017   Carotid stenosis 06/18/2017   Bilateral carotid artery stenosis 06/16/2017   Benign essential HTN 05/19/2017   CKD (chronic kidney disease) stage 5, GFR less than 15 ml/min ([place]) 05/19/2017   Hyperlipidemia, mixed 05/19/2017   Shortness of breath 05/04/2017   Rash, skin 12/16/2016   Cellulitis of lower extremity 07/29/2016   Chronic venous insufficiency 07/29/2016   Lymphedema 07/29/2016   TIA (transient ischemic attack) 04/21/2016   Altered mental status 04/08/2016   Hyperammonemia ([place]) 04/08/2016   Elevated troponin 04/08/2016   Depression 04/08/2016   Depression, major, recurrent, severe with psychosis ([place]) 04/08/2016   Blood in stool    Intractable cyclical vomiting with nausea    Reflux esophagitis    Gastritis    Generalized abdominal pain    Uncontrollable vomiting    Major depressive disorder, recurrent episode, moderate ([place]) 03/15/2016   Adjustment disorder with mixed anxiety and depressed mood 03/15/2016   Malnutrition of moderate degree 12/01/2015   Renal mass    Dyspnea    Acute renal failure ([place])    Respiratory failure ([place])    High temperature 11/14/2015   Encounter for central line placement    Encounter for orogastric (OG) tube placement    Nausea 11/12/2015   Hyperkalemia 10/03/2015   Diarrhea, unspecified 07/22/2015   Pneumonia 05/21/2015   Hypoglycemia 04/24/2015   Unresponsiveness 04/24/2015   Sinus bradycardia 04/24/2015   Hypothermia 04/24/2015   Acute respiratory failure ([place]) 04/24/2015   Acute on chronic diastolic CHF (congestive heart failure) ([place]) 04/05/2015   Diabetic gastroparesis ([place])  04/05/2015   Hypokalemia 04/05/2015   Generalized weakness 04/05/2015   Acute pulmonary edema (HCC) 04/03/2015   Nausea and vomiting 04/03/2015   Hypoglycemia associated with diabetes ([place]) 04/03/2015   Anemia of chronic disease 04/03/2015   Secondary hyperparathyroidism ([place]) 04/03/2015   Pressure ulcer 04/02/2015   Acute respiratory failure with hypoxia ([place]) 04/01/2015   Adjustment disorder with anxiety 03/14/2015   Somatic symptom disorder, mild 03/08/2015   Coronary artery disease involving native coronary artery of native heart without angina pectoris    Nausea & vomiting 03/06/2015   Abdominal pain 03/06/2015   Diabetes mellitus, type II ([place]) 03/06/2015   HTN (hypertension) 03/06/2015   Gastroparesis 02/24/2015   Pleural effusion 02/19/2015   Bacteremia due to Enterococcus 02/19/2015   End-stage renal disease on hemodialysis ([place]) 02/19/2015   Diarrhea 08/01/2014    Past Surgical History:  Procedure Laterality Date   A/V SHUNTOGRAM Left 01/20/2018  Procedure: A/V SHUNTOGRAM;  Surgeon: Algernon Huxley, MD;  Location: [facility];  Service: Cardiovascular;  Laterality: Left;   ABDOMINAL HYSTERECTOMY  1992   AMPUTATION TOE Left 10/02/2017   Procedure: AMPUTATION TOE-LEFT GREAT TOE;  Surgeon: Albertine Patricia, DPM;  Location: [facility];  Service: Podiatry;  Laterality: Left;   APPENDECTOMY     APPLICATION OF WOUND VAC N/A 11/25/2019   Procedure: APPLICATION OF WOUND VAC;  Surgeon: Katha Cabal, MD;  Location: [facility];  Service: Vascular;  Laterality: N/A;   ARTERY BIOPSY Right 10/11/2018   Procedure: BIOPSY TEMPORAL ARTERY;  Surgeon: Vickie Epley, MD;  Location: [facility];  Service: General;  Laterality: Right;   CARDIAC CATHETERIZATION Left 07/26/2015   Procedure: Left Heart Cath and Coronary Angiography;  Surgeon: Dionisio David, MD;  Location: [facility];  Service: Cardiovascular;  Laterality: Left;   CATARACT EXTRACTION W/ INTRAOCULAR LENS IMPLANT  Right    CATARACT EXTRACTION W/PHACO Left 03/10/2017   Procedure: CATARACT EXTRACTION PHACO AND INTRAOCULAR LENS PLACEMENT ([place]);  Surgeon: Birder Robson, MD;  Location: [facility];  Service: Ophthalmology;  Laterality: Left;  [place] 00:51.9 AP% 14.2 CDE 7.39 fluid pack lot # [id number]   CENTRAL LINE INSERTION Right 11/11/2019   Procedure: CENTRAL LINE INSERTION;  Surgeon: Katha Cabal, MD;  Location: [facility];  Service: Vascular;  Laterality: Right;   CENTRAL LINE INSERTION  11/25/2019   Procedure: CENTRAL LINE INSERTION;  Surgeon: Katha Cabal, MD;  Location: [facility];  Service: Vascular;;   CHOLECYSTECTOMY     COLONOSCOPY WITH PROPOFOL N/A 08/12/2016   Procedure: COLONOSCOPY WITH PROPOFOL;  Surgeon: Lollie Sails, MD;  Location: [facility] ENDOSCOPY;  Service: Endoscopy;  Laterality: N/A;   DIALYSIS FISTULA CREATION Left    upper arm   dialysis grafts     DIALYSIS/PERMA CATHETER INSERTION N/A 11/14/2019   Procedure: DIALYSIS/PERMA CATHETER INSERTION;  Surgeon: Algernon Huxley, MD;  Location: [facility];  Service: Cardiovascular;  Laterality: N/A;   DIALYSIS/PERMA CATHETER INSERTION N/A 02/03/2020   Procedure: DIALYSIS/PERMA CATHETER INSERTION;  Surgeon: Katha Cabal, MD;  Location: [facility];  Service: Cardiovascular;  Laterality: N/A;   DIALYSIS/PERMA CATHETER INSERTION N/A 06/19/2020   Procedure: DIALYSIS/PERMA CATHETER INSERTION;  Surgeon: Katha Cabal, MD;  Location: [facility];  Service: Cardiovascular;  Laterality: N/A;   DIALYSIS/PERMA CATHETER REMOVAL N/A 05/25/2020   Procedure: DIALYSIS/PERMA CATHETER REMOVAL;  Surgeon: Katha Cabal, MD;  Location: [facility];  Service: Cardiovascular;  Laterality: N/A;   ESOPHAGOGASTRODUODENOSCOPY N/A 03/08/2015   Procedure: ESOPHAGOGASTRODUODENOSCOPY (EGD);  Surgeon: Manya Silvas, MD;  Location: [facility];  Service: Endoscopy;  Laterality: N/A;   ESOPHAGOGASTRODUODENOSCOPY (EGD)  WITH PROPOFOL N/A 03/18/2016   Procedure: ESOPHAGOGASTRODUODENOSCOPY (EGD) WITH PROPOFOL;  Surgeon: [name], MD;  Location: [facility]OSCOPY;  Service: Endoscopy;  Laterality: N/A;   EYE SURGERY Right 2018   FECAL TRANSPLANT N/A 08/23/2015   Procedure: FECAL TRANSPLANT;  Surgeon: Manya Silvas, MD;  Location: [facility]OSCOPY;  Service: Endoscopy;  Laterality: N/A;   HAND SURGERY Bilateral    HEMATOMA EVACUATION Left 11/25/2019   Procedure: EVACUATION HEMATOMA;  Surgeon: Katha Cabal, MD;  Location: [facility];  Service: Vascular;  Laterality: Left;   I & D EXTREMITY Left 11/25/2019   Procedure: IRRIGATION AND DEBRIDEMENT EXTREMITY;  Surgeon: Katha Cabal, MD;  Location: [facility];  Service: Vascular;  Laterality: Left;   IR FLUORO GUIDE CV LINE RIGHT  04/06/2020   IR INJECT/THERA/INC NEEDLE/CATH/PLC EPI/CERV/THOR W/IMG  08/13/2020   IR RADIOLOGIST EVAL & MGMT  07/28/2019   IR RADIOLOGIST EVAL & MGMT  08/11/2019   LIGATION OF ARTERIOVENOUS  FISTULA Left 11/11/2019   LIGATION OF ARTERIOVENOUS  FISTULA Left 11/11/2019   Procedure: LIGATION OF ARTERIOVENOUS  FISTULA;  Surgeon: Katha Cabal, MD;  Location: [facility];  Service: Vascular;  Laterality: Left;   LIGATIONS OF HERO GRAFT Right 06/13/2020   Procedure: LIGATION / REMOVAL OF RIGHT HERO GRAFT;  Surgeon: Katha Cabal, MD;  Location: [facility];  Service: Vascular;  Laterality: Right;   PERIPHERAL VASCULAR CATHETERIZATION N/A 12/20/2015   Procedure: Thrombectomy of dialysis access versus permcath placement;  Surgeon: Algernon Huxley, MD;  Location: [facility];  Service: Cardiovascular;  Laterality: N/A;   PERIPHERAL VASCULAR CATHETERIZATION N/A 12/20/2015   Procedure: A/V Shunt Intervention;  Surgeon: Algernon Huxley, MD;  Location: [facility];  Service: Cardiovascular;  Laterality: N/A;   PERIPHERAL VASCULAR CATHETERIZATION N/A 12/20/2015   Procedure: A/V Shuntogram/Fistulagram;  Surgeon: Algernon Huxley, MD;  Location: [place] [facility];  Service: Cardiovascular;  Laterality: N/A;   PERIPHERAL VASCULAR CATHETERIZATION N/A 01/02/2016   Procedure: A/V Shuntogram/Fistulagram;  Surgeon: Algernon Huxley, MD;  Location: [facility];  Service: Cardiovascular;  Laterality: N/A;   PERIPHERAL VASCULAR CATHETERIZATION N/A 01/02/2016   Procedure: A/V Shunt Intervention;  Surgeon: Algernon Huxley, MD;  Location: [facility] [facility];  Service: Cardiovascular;  Laterality: N/A;   TEE WITHOUT CARDIOVERSION N/A 06/11/2020   Procedure: TRANSESOPHAGEAL ECHOCARDIOGRAM (TEE);  Surgeon: Teodoro Spray, MD;  Location: [facility];  Service: Cardiovascular;  Laterality: N/A;   UPPER EXTREMITY ANGIOGRAPHY Bilateral 11/27/2020   Procedure: UPPER EXTREMITY ANGIOGRAPHY;  Surgeon: Katha Cabal, MD;  Location: [facility];  Service: Cardiovascular;  Laterality: Bilateral;   UPPER EXTREMITY VENOGRAPHY Right 01/18/2020   Procedure: UPPER EXTREMITY VENOGRAPHY;  Surgeon: Katha Cabal, MD;  Location: [facility];  Service: Cardiovascular;  Laterality: Right;   UPPER EXTREMITY VENOGRAPHY Bilateral 11/27/2020   Procedure: UPPER EXTREMITY VENOGRAPHY;  Surgeon: Katha Cabal, MD;  Location: [facility] [facility];  Service: Cardiovascular;  Laterality: Bilateral;   VASCULAR ACCESS DEVICE INSERTION Right 04/13/2020   Procedure: INSERTION OF HERO VASCULAR ACCESS DEVICE (GRAFT);  Surgeon: Katha Cabal, MD;  Location: [facility];  Service: Vascular;  Laterality: Right;    Prior to Admission medications   Medication Sig Start Date End Date Taking? Authorizing Provider  albuterol (PROVENTIL) (2.5 MG/3ML) 0.083% nebulizer solution Take 2.5 mg by nebulization every 4 (four) hours as needed for wheezing or shortness of breath.    [provider]  albuterol (VENTOLIN HFA) 108 (90 Base) MCG/ACT inhaler Inhale 2 puffs into the lungs every 4 (four) hours as needed for wheezing or shortness of breath. 11/09/19   [provider]  alum & mag hydroxide-simeth (MAALOX/MYLANTA) 200-200-20 MG/5ML suspension Take 15 mLs by mouth every 6 (six) hours as needed for indigestion or heartburn. 06/25/20   Enzo Bi, MD  amLODipine (NORVASC) 10 MG tablet Take 1 tablet (10 mg total) by mouth daily. 06/25/20   Enzo Bi, MD  ammonium lactate (LAC-HYDRIN) 12 % lotion Apply 1 application topically 2 (two) times daily as needed for dry skin.     [provider]  Ascorbic Acid (VITA-C PO) Take 1 tablet by mouth daily.    [provider]  aspirin EC 81  MG tablet Take 81 mg by mouth daily.    [provider]  atorvastatin (LIPITOR) 20 MG tablet Take 20 mg by mouth every evening.  10/24/19   [provider]  Biotin 1 MG CAPS Take 1 tablet by mouth daily.    [provider]  calcium carbonate (TUMS - DOSED IN MG ELEMENTAL CALCIUM) 500 MG chewable tablet Chew 2.5 tablets (500 mg of elemental calcium total) by mouth every 6 (six) hours as needed for indigestion. 06/25/20   Enzo Bi, MD  Cholecalciferol (VITAMIN D3) 25 MCG (1000 UT) CAPS Take 1,000 Units by mouth daily.     [provider]  cholestyramine light (PREVALITE) 4 g packet Take 4 g by mouth 2 (two) times daily.    [provider]  CREON 12000 units CPEP capsule Take 36,000 Units by mouth 3 (three) times daily before meals.  10/21/19   [provider]  cyanocobalamin 1000 MCG tablet Take 1,000 mcg by mouth daily.     [provider]  diphenhydrAMINE (BENADRYL) 25 MG tablet Take 25 mg by mouth every 6 (six) hours as needed for itching.    [provider]  epoetin alfa (EPOGEN) 10000 UNIT/ML injection Inject 1 mL (10,000 Units total) into the vein every Monday, Wednesday, and Friday with hemodialysis. 06/27/20   Enzo Bi, MD  Ferrous Sulfate (IRON) 325 (65 Fe) MG TABS Take 325 mg by mouth daily.     [provider]  furosemide (LASIX) 80 MG tablet Take 1 tablet by mouth daily. 08/13/20    [provider]  gabapentin (NEURONTIN) 100 MG capsule Take 100 mg by mouth every evening.     [provider]  hydrALAZINE (APRESOLINE) 50 MG tablet Take 1 tablet (50 mg total) by mouth every 8 (eight) hours. 06/25/20   Enzo Bi, MD  HYDROcodone-acetaminophen (NORCO/VICODIN) 5-325 MG tablet Take 1 tablet by mouth 3 (three) times daily as needed for moderate pain or severe pain. 12/17/20   Lorella Nimrod, MD  hydrOXYzine (ATARAX/VISTARIL) 25 MG tablet Take 25 mg by mouth daily.     [provider]  lidocaine (LIDODERM) 5 % SMARTSIG:Topical 04/02/21   [provider]  meclizine (ANTIVERT) 25 MG tablet Take 25 mg by mouth 4 (four) times daily as needed for dizziness.    [provider]  megestrol (MEGACE) 400 MG/10ML suspension Take 10 mLs (400 mg total) by mouth daily. 09/21/20   Shelly Coss, MD  multivitamin (RENA-VIT) TABS tablet Take 1 tablet by mouth daily.     [provider]  mupirocin ointment (BACTROBAN) 2 % Apply 1 application topically daily as needed (leg rash).  11/09/19   [provider]  nitroGLYCERIN (NITROSTAT) 0.4 MG SL tablet Place 0.4 mg under the tongue every 5 (five) minutes as needed for chest pain.     [provider]  Nutritional Supplements (FEEDING SUPPLEMENT, NEPRO CARB STEADY,) LIQD Take 237 mLs by mouth 2 (two) times daily between meals. 12/17/20   Lorella Nimrod, MD  Omega-3 300 MG CAPS Take 300 mg by mouth 2 (two) times daily. Every other day opposite omega XL    [provider]  ondansetron (ZOFRAN) 4 MG tablet Take 1 tablet (4 mg total) by mouth every 8 (eight) hours as needed for nausea or vomiting. 09/21/20 09/21/21  Shelly Coss, MD  pantoprazole (PROTONIX) 40 MG tablet Take 40 mg by mouth daily. 08/28/19   [provider]  polyethylene glycol (MIRALAX / GLYCOLAX) 17 g packet Take  17 g by mouth daily as needed. 04/02/21   Nolberto Hanlon, MD  Probiotic Product (PROBIOTIC PO) Take  1 capsule by mouth daily.    [provider]  sevelamer carbonate (RENVELA) 800 MG tablet Take 800 mg by mouth 4 (four) times daily. (at meals and with a snack)    [provider]  vitamin E 180 MG (400 UNITS) capsule Take 400 Units by mouth daily.    [provider]    Allergies Ace inhibitors, Ativan [lorazepam], Compazine [prochlorperazine edisylate], Sumatriptan succinate, Zofran [ondansetron], Losartan, Prochlorperazine, Reglan [metoclopramide], Scopolamine, Tape, Tapentadol, and Ultrasound gel  Family History  Problem Relation Age of Onset   Kidney disease Mother    Diabetes Mother    Cancer Father    Kidney disease Sister     Social History Social History   Tobacco Use   Smoking status: Never   Smokeless tobacco: Never  Vaping Use   Vaping Use: Never used  Substance Use Topics   Alcohol use: Not Currently    Comment: glass wine week per pt   Drug use: Yes    Types: Marijuana    Comment: once a day    Review of Systems  Constitutional: Positive for subjective fever/chills Eyes: No visual changes. ENT: No sore throat. Cardiovascular: Denies chest pain. Respiratory: Denies shortness of breath. Gastrointestinal: Positive for abdominal pain and nausea no vomiting.  No diarrhea.  No constipation. Genitourinary: Negative for dysuria. Musculoskeletal: Negative for back pain. Skin: Negative for rash. Neurological: Negative for headaches, focal weakness or numbness.  ____________________________________________   PHYSICAL EXAM:  VITAL SIGNS: Vitals:   04/25/21 1843 04/25/21 1852  BP: (!) 116/52 (!) 123/52  Pulse: 84 81  Resp:  18  Temp:    SpO2: 91% 100%     Constitutional: Alert and oriented to year and location.  Chronically ill-appearing. Eyes: Conjunctivae are normal. PERRL. EOMI. Head: Atraumatic. Nose: No congestion/rhinnorhea. Mouth/Throat: Mucous membranes are dry.  Oropharynx non-erythematous. Neck: No stridor. No  cervical spine tenderness to palpation. Cardiovascular: Normal rate, regular rhythm. Grossly normal heart sounds.  Good peripheral circulation. Respiratory: Normal respiratory effort.  No retractions. Lungs CTAB. Gastrointestinal: Soft , nondistended.  Diffuse frontal abdominal tenderness with voluntary guarding throughout making examination difficult. Musculoskeletal: No lower extremity tenderness.  No joint effusions. No signs of acute trauma. Neurologic:  Normal speech and language. No gross focal neurologic deficits are appreciated. Skin:  Skin is warm, dry and intact. No rash noted. Psychiatric: Mood and affect are normal. Speech and behavior are normal.  ____________________________________________   LABS (all labs ordered are listed, but only abnormal results are displayed)  Labs Reviewed  COMPREHENSIVE METABOLIC PANEL - Abnormal; Notable for the following components:      Result Value   Sodium 133 (*)    Glucose, Bld 46 (*)    Creatinine, Ser 3.63 (*)    Calcium 8.0 (*)    Albumin 2.3 (*)    Alkaline Phosphatase 137 (*)    GFR, Estimated 14 (*)    All other components within normal limits  CBC WITH DIFFERENTIAL/PLATELET - Abnormal; Notable for the following components:   RBC 3.21 (*)    Hemoglobin 9.1 (*)    HCT 28.8 (*)    RDW 22.7 (*)    All other components within normal limits  BRAIN NATRIURETIC PEPTIDE - Abnormal; Notable for the following components:   B Natriuretic Peptide 821.4 (*)    All other components within normal limits  CBG MONITORING, ED -  Abnormal; Notable for the following components:   Glucose-Capillary 14 (*)    All other components within normal limits  CBG MONITORING, ED - Abnormal; Notable for the following components:   Glucose-Capillary 102 (*)    All other components within normal limits  RESP PANEL BY RT-PCR (FLU A&B, COVID) ARPGX2  CULTURE, BLOOD (ROUTINE X 2)  CULTURE, BLOOD (ROUTINE X 2)  LACTIC ACID, PLASMA  LACTIC ACID, PLASMA   PROTIME-INR  MAGNESIUM  LIPASE, BLOOD  PROCALCITONIN  PROCALCITONIN  TROPONIN I (HIGH SENSITIVITY)  TROPONIN I (HIGH SENSITIVITY)   ____________________________________________  12 Lead EKG  Sinus rhythm, rate 97 bpm.  Normal axis and intervals.  Low amplitude.  No evidence of acute ischemia. ____________________________________________  RADIOLOGY  ED MD interpretation: 2 view CXR reviewed by me with small bilateral pleural effusions and patchy opacities to the bilateral bases.  Official radiology report(s): CT ABDOMEN PELVIS WO CONTRAST  Result Date: 04/25/2021 CLINICAL DATA:  Diffuse abdominal pain EXAM: CT ABDOMEN AND PELVIS WITHOUT CONTRAST TECHNIQUE: Multidetector CT imaging of the abdomen and pelvis was performed following the standard protocol without IV contrast. COMPARISON:  CT 06/16/2020 FINDINGS: Lower chest: Lung bases demonstrate small bilateral pleural effusions and cardiomegaly. Hazy lung densities at the bases likely due to edema. Atelectasis at both bases. Hepatobiliary: Status post cholecystectomy. No focal hepatic abnormality. No biliary dilatation. Pancreas: Unremarkable. No pancreatic ductal dilatation or surrounding inflammatory changes. Spleen: Normal in size without focal abnormality. Adrenals/Urinary Tract: Adrenal glands are normal. Atrophic native kidneys without hydronephrosis. Stable cyst in the mid left kidney. The bladder is nearly empty Stomach/Bowel: The stomach is nonenlarged. No dilated small bowel. No acute bowel wall thickening. Diverticular disease of left colon without acute wall thickening Vascular/Lymphatic: Extensive vascular calcification. Mild retroperitoneal lymph nodes measuring up to 12 mm Reproductive: Status post hysterectomy. No adnexal masses. Other: Negative for free air. Small free fluid within the abdomen and pelvis. Extensive edema within the subcutaneous soft tissues. Musculoskeletal: No acute osseous abnormality IMPRESSION: 1.  Cardiomegaly with small bilateral pleural effusions and hazy lung densities likely due to edema. Small amount of abdominopelvic ascites. Extensive edema within the subcutaneous soft tissues consistent with anasarca. 2. Native atrophic kidneys without hydronephrosis 3. Negative for bowel obstruction or bowel wall thickening. Left colon diverticular disease without acute wall thickening Electronically Signed   By: Donavan Foil M.D.   On: 04/25/2021 20:38   DG Chest 2 View  Result Date: 04/25/2021 CLINICAL DATA:  Suspected sepsis. EXAM: CHEST - 2 VIEW COMPARISON:  Chest x-ray 03/29/2021, CT chest 08/10/2020 FINDINGS: Left dialysis catheter with tips overlying the right atrium. Left subclavian vascular stent again noted in similar position. Slightly more prominent cardiac silhouette which may be due to technique in a patient with an AP sitting radiograph. Otherwise the heart size and mediastinal contours are unchanged. No definite pericardial effusion on lateral view with limited evaluation. Question coronary stent. Bibasilar patchy airspace opacities including of the lingula. Nodule like density on lateral view. Coarsened interstitial markings with no overt pulmonary edema. Persistent bilateral trace to small volume pleural effusions. No pneumothorax. No acute osseous abnormality. IMPRESSION: 1. Persistent bilateral trace to small volume pleural effusions with associated patchy airspace opacities of bilateral lobe lower lobes and lingula likely representing infection/inflammation. Nodular-like density on lateral view in the lower lobe. Followup PA and lateral chest X-ray is recommended in 3-4 weeks following therapy to ensure resolution and exclude underlying malignancy. 2. Cardiomegaly with slightly more prominent cardiac silhouette which may be due  to technique in a patient with an AP sitting radiograph. Electronically Signed   By: Iven Finn M.D.   On: 04/25/2021 17:58     ____________________________________________   PROCEDURES and INTERVENTIONS  Procedure(s) performed (including Critical Care):  .1-3 Lead EKG Interpretation  Date/Time: 04/25/2021 9:04 PM Performed by: Vladimir Crofts, MD Authorized by: Vladimir Crofts, MD     Interpretation: normal     ECG rate:  80   ECG rate assessment: normal     Rhythm: sinus rhythm     Ectopy: none     Conduction: normal    Medications  iohexol (OMNIPAQUE) 9 MG/ML oral solution 500 mL ( Oral Canceled Entry 04/25/21 1956)  cefTRIAXone (ROCEPHIN) 1 g in sodium chloride 0.9 % 100 mL IVPB (has no administration in time range)  azithromycin (ZITHROMAX) 500 mg in sodium chloride 0.9 % 250 mL IVPB (has no administration in time range)  acetaminophen (TYLENOL) tablet 1,000 mg (1,000 mg Oral Given 04/25/21 1634)  dextrose 50 % solution (50 mLs  Given 04/25/21 1852)  acetaminophen (TYLENOL) tablet 1,000 mg (1,000 mg Oral Given 04/25/21 1901)  ondansetron (ZOFRAN) injection 4 mg (4 mg Intravenous Given 04/25/21 1900)    ____________________________________________   MDM / ED COURSE   60 year old dialysis patient presents to the ED with a febrile illness, possibly related to CAP versus UTI, and requiring medical admission.  Febrile to 103.2 F, but otherwise hemodynamically stable.  Exam was somewhat difficult due to her diffuse abdominal guarding and tenderness, she otherwise does not have signs of etiology of her febrile illness.  CT imaging obtained without evidence of obstructive pathology, but does further demonstrate cardiopulmonary volume overload and stigmata of anasarca.  CXR with pulmonary congestion and possible infiltration.  Of note she has no respiratory symptoms.  Furthermore, urine catheterization with purulent/milky specimen but very small volume and unfortunately the lab cannot run a urinalysis off of this.  Less likely CAP considering her lack of symptoms, but certainly possibility in the setting of her CXR.  We  will cover for both CAP and UTI and discussed the case with hospitalist for admission.   Clinical Course as of 04/25/21 2108  Thu Apr 25, 2021  1937 Reassessed. Drinking contrast [DS]    Clinical Course User Index [DS] Vladimir Crofts, MD    ____________________________________________   FINAL CLINICAL IMPRESSION(S) / ED DIAGNOSES  Final diagnoses:  Fever in other diseases     ED Discharge Orders     None        Anniyah Mood Tamala Julian   Note:  This document was prepared using Dragon voice recognition software and may include unintentional dictation errors.    Vladimir Crofts, MD 04/25/21 2110

## 2021-04-25 NOTE — ED Provider Notes (Signed)
Emergency Medicine Provider Triage Evaluation Note  [name] , a 60 y.o. female  was evaluated in triage.  Pt complains of fever, abdominal pain and generalized not feeling well.  Patient has history of known renal failure, received appropriate dialysis she reports today without any complications.  She has overall not been feeling well for the last few days, worse this afternoon. Patient is unable to answer any additional questions - unable to determine if this is related to altered status or other cause.  Review of Systems  ROS limited by patient's ability to answer questions  Positive: Fever, abdominal pain Negative: Nausea, vomiting  Physical Exam  Pulse 98   Temp (!) 103.2 F (39.6 C) (Oral)   Resp (!) 22   Ht 5\' 5"  (1.651 m)   Wt 52 kg   SpO2 98%   BMI 19.08 kg/m  Gen:   Awake, alert and oriented, however appears in acute pain Resp:  Normal effort  Abd:  Distended with diffuse tenderness, worse in the left lower quadrant Other:  Patient has difficulty answering questions, unclear if this is related to altered status versus other.  Medical Decision Making  Medically screening exam initiated at 4:23 PM.  Appropriate orders placed.  [name] was informed that the remainder of the evaluation will be completed by another provider, this initial triage assessment does not replace that evaluation, and the importance of remaining in the ED until their evaluation is complete.  Discussed with triage nurse, initiated labs for the infection and altered status given dialysis patient with fever and near tachycardia.   Marlana Salvage, PA 04/25/21 5056    Vladimir Crofts, MD 04/25/21 [phone]

## 2021-04-26 ENCOUNTER — Other Ambulatory Visit: Payer: Self-pay

## 2021-04-26 ENCOUNTER — Inpatient Hospital Stay: Payer: Medicare Other

## 2021-04-26 DIAGNOSIS — A419 Sepsis, unspecified organism: Secondary | ICD-10-CM

## 2021-04-26 DIAGNOSIS — R6520 Severe sepsis without septic shock: Secondary | ICD-10-CM | POA: Diagnosis not present

## 2021-04-26 DIAGNOSIS — R7881 Bacteremia: Secondary | ICD-10-CM

## 2021-04-26 DIAGNOSIS — G9341 Metabolic encephalopathy: Secondary | ICD-10-CM

## 2021-04-26 LAB — BLOOD CULTURE ID PANEL (REFLEXED) - BCID2
A.calcoaceticus-baumannii: DETECTED — AB
Bacteroides fragilis: NOT DETECTED
CTX-M ESBL: NOT DETECTED
Candida albicans: NOT DETECTED
Candida auris: NOT DETECTED
Candida glabrata: NOT DETECTED
Candida krusei: NOT DETECTED
Candida parapsilosis: NOT DETECTED
Candida tropicalis: NOT DETECTED
Carbapenem resistance IMP: NOT DETECTED
Carbapenem resistance KPC: NOT DETECTED
Carbapenem resistance NDM: NOT DETECTED
Carbapenem resistance VIM: NOT DETECTED
Cryptococcus neoformans/gattii: NOT DETECTED
Enterobacter cloacae complex: NOT DETECTED
Enterobacterales: NOT DETECTED
Enterococcus Faecium: NOT DETECTED
Enterococcus faecalis: NOT DETECTED
Escherichia coli: NOT DETECTED
Haemophilus influenzae: NOT DETECTED
Klebsiella aerogenes: NOT DETECTED
Klebsiella oxytoca: NOT DETECTED
Klebsiella pneumoniae: NOT DETECTED
Listeria monocytogenes: NOT DETECTED
Neisseria meningitidis: NOT DETECTED
Proteus species: NOT DETECTED
Pseudomonas aeruginosa: NOT DETECTED
Salmonella species: NOT DETECTED
Serratia marcescens: NOT DETECTED
Staphylococcus aureus (BCID): NOT DETECTED
Staphylococcus epidermidis: NOT DETECTED
Staphylococcus lugdunensis: NOT DETECTED
Staphylococcus species: NOT DETECTED
Stenotrophomonas maltophilia: NOT DETECTED
Streptococcus agalactiae: NOT DETECTED
Streptococcus pneumoniae: NOT DETECTED
Streptococcus pyogenes: NOT DETECTED
Streptococcus species: NOT DETECTED

## 2021-04-26 LAB — BLOOD GAS, VENOUS
Acid-base deficit: 2.2 mmol/L — ABNORMAL HIGH (ref 0.0–2.0)
Bicarbonate: 24.5 mmol/L (ref 20.0–28.0)
O2 Saturation: 72.2 %
Patient temperature: 37
pCO2, Ven: 51 mmHg (ref 44.0–60.0)
pH, Ven: 7.29 (ref 7.250–7.430)
pO2, Ven: 43 mmHg (ref 32.0–45.0)

## 2021-04-26 LAB — CBG MONITORING, ED
Glucose-Capillary: 114 mg/dL — ABNORMAL HIGH (ref 70–99)
Glucose-Capillary: 119 mg/dL — ABNORMAL HIGH (ref 70–99)
Glucose-Capillary: 57 mg/dL — ABNORMAL LOW (ref 70–99)
Glucose-Capillary: 61 mg/dL — ABNORMAL LOW (ref 70–99)
Glucose-Capillary: 66 mg/dL — ABNORMAL LOW (ref 70–99)
Glucose-Capillary: 67 mg/dL — ABNORMAL LOW (ref 70–99)
Glucose-Capillary: 68 mg/dL — ABNORMAL LOW (ref 70–99)
Glucose-Capillary: 70 mg/dL (ref 70–99)
Glucose-Capillary: 71 mg/dL (ref 70–99)
Glucose-Capillary: 75 mg/dL (ref 70–99)
Glucose-Capillary: 76 mg/dL (ref 70–99)
Glucose-Capillary: 80 mg/dL (ref 70–99)

## 2021-04-26 LAB — BLOOD GAS, ARTERIAL
Acid-base deficit: 3 mmol/L — ABNORMAL HIGH (ref 0.0–2.0)
Bicarbonate: 22 mmol/L (ref 20.0–28.0)
FIO2: 0.8
MECHVT: 450 mL
Mechanical Rate: 20
O2 Saturation: 99.8 %
PEEP: 5 cmH2O
Patient temperature: 37
RATE: 20 resp/min
pCO2 arterial: 38 mmHg (ref 32.0–48.0)
pH, Arterial: 7.37 (ref 7.350–7.450)
pO2, Arterial: 220 mmHg — ABNORMAL HIGH (ref 83.0–108.0)

## 2021-04-26 LAB — COMPREHENSIVE METABOLIC PANEL
ALT: 11 U/L (ref 0–44)
AST: 23 U/L (ref 15–41)
Albumin: 1.6 g/dL — ABNORMAL LOW (ref 3.5–5.0)
Alkaline Phosphatase: 100 U/L (ref 38–126)
Anion gap: 7 (ref 5–15)
BUN: 18 mg/dL (ref 8–23)
CO2: 24 mmol/L (ref 22–32)
Calcium: 7 mg/dL — ABNORMAL LOW (ref 8.9–10.3)
Chloride: 100 mmol/L (ref 98–111)
Creatinine, Ser: 4.04 mg/dL — ABNORMAL HIGH (ref 0.44–1.00)
GFR, Estimated: 12 mL/min — ABNORMAL LOW (ref >60)
Glucose, Bld: 103 mg/dL — ABNORMAL HIGH (ref 70–99)
Potassium: 5 mmol/L (ref 3.5–5.1)
Sodium: 131 mmol/L — ABNORMAL LOW (ref 135–145)
Total Bilirubin: 0.8 mg/dL (ref 0.3–1.2)
Total Protein: 5.5 g/dL — ABNORMAL LOW (ref 6.5–8.1)

## 2021-04-26 LAB — CBC WITH DIFFERENTIAL/PLATELET
Abs Immature Granulocytes: 0.01 10*3/uL (ref 0.00–0.07)
Basophils Absolute: 0 10*3/uL (ref 0.0–0.1)
Basophils Relative: 0 %
Eosinophils Absolute: 0.1 10*3/uL (ref 0.0–0.5)
Eosinophils Relative: 2 %
HCT: 25.5 % — ABNORMAL LOW (ref 36.0–46.0)
Hemoglobin: 8.1 g/dL — ABNORMAL LOW (ref 12.0–15.0)
Immature Granulocytes: 0 %
Lymphocytes Relative: 10 %
Lymphs Abs: 0.4 10*3/uL — ABNORMAL LOW (ref 0.7–4.0)
MCH: 28.9 pg (ref 26.0–34.0)
MCHC: 31.8 g/dL (ref 30.0–36.0)
MCV: 91.1 fL (ref 80.0–100.0)
Monocytes Absolute: 0.2 10*3/uL (ref 0.1–1.0)
Monocytes Relative: 5 %
Neutro Abs: 3.4 10*3/uL (ref 1.7–7.7)
Neutrophils Relative %: 83 %
Platelets: 188 10*3/uL (ref 150–400)
RBC: 2.8 MIL/uL — ABNORMAL LOW (ref 3.87–5.11)
RDW: 21.8 % — ABNORMAL HIGH (ref 11.5–15.5)
Smear Review: NORMAL
WBC: 4.1 10*3/uL (ref 4.0–10.5)
nRBC: 0 % (ref 0.0–0.2)

## 2021-04-26 LAB — GLUCOSE, CAPILLARY
Glucose-Capillary: 124 mg/dL — ABNORMAL HIGH (ref 70–99)
Glucose-Capillary: 106 mg/dL — ABNORMAL HIGH (ref 70–99)
Glucose-Capillary: 100 mg/dL — ABNORMAL HIGH (ref 70–99)
Glucose-Capillary: 94 mg/dL (ref 70–99)

## 2021-04-26 LAB — MRSA NEXT GEN BY PCR, NASAL: MRSA by PCR Next Gen: NOT DETECTED

## 2021-04-26 LAB — AMMONIA: Ammonia: 55 umol/L — ABNORMAL HIGH (ref 9–35)

## 2021-04-26 LAB — MAGNESIUM
Magnesium: 1.6 mg/dL — ABNORMAL LOW (ref 1.7–2.4)
Magnesium: 1.6 mg/dL — ABNORMAL LOW (ref 1.7–2.4)

## 2021-04-26 LAB — PHOSPHORUS: Phosphorus: 3.9 mg/dL (ref 2.5–4.6)

## 2021-04-26 LAB — PROCALCITONIN: Procalcitonin: 11.7 ng/mL

## 2021-04-26 LAB — CORTISOL-AM, BLOOD: Cortisol - AM: 9.8 ug/dL (ref 6.7–22.6)

## 2021-04-26 LAB — VITAMIN D 25 HYDROXY (VIT D DEFICIENCY, FRACTURES): Vit D, 25-Hydroxy: 38.54 ng/mL (ref 30–100)

## 2021-04-26 MED ORDER — ACETAMINOPHEN 325 MG PO TABS: 650.0000 mg | ORAL_TABLET | Freq: Four times a day (QID) | ORAL | Status: DC | PRN

## 2021-04-26 MED ORDER — FENTANYL CITRATE (PF) 100 MCG/2ML IJ SOLN
50.0000 ug | INTRAMUSCULAR | Status: AC | PRN
  Administered 2021-04-26 – 2021-04-27 (×3): 50 ug via INTRAVENOUS
  Filled 2021-04-26 (×3): qty 2

## 2021-04-26 MED ORDER — MIDODRINE HCL 5 MG PO TABS
10.0000 mg | ORAL_TABLET | Freq: Once | ORAL | Status: AC
  Administered 2021-04-26: 10 mg via ORAL
  Filled 2021-04-26: qty 2

## 2021-04-26 MED ORDER — HEPARIN SODIUM (PORCINE) 5000 UNIT/ML IJ SOLN
5000.0000 [IU] | Freq: Three times a day (TID) | INTRAMUSCULAR | Status: DC
  Administered 2021-04-26 – 2021-05-10 (×40): 5000 [IU] via SUBCUTANEOUS
  Filled 2021-04-26 (×40): qty 1

## 2021-04-26 MED ORDER — DOCUSATE SODIUM 50 MG/5ML PO LIQD: 100.0000 mg | Freq: Two times a day (BID) | ORAL | Status: DC

## 2021-04-26 MED ORDER — VASOPRESSIN 20 UNITS/100 ML INFUSION FOR SHOCK
0.0000 [IU]/min | INTRAVENOUS | Status: DC
Start: 2021-04-26 — End: 2021-04-28
  Administered 2021-04-26 (×2): 0.03 [IU]/min via INTRAVENOUS
  Filled 2021-04-26 (×2): qty 100

## 2021-04-26 MED ORDER — PRISMASOL BGK 0/2.5 32-2.5 MEQ/L EC SOLN
Status: DC
  Filled 2021-04-26 (×6): qty 5000

## 2021-04-26 MED ORDER — ACETAMINOPHEN 650 MG RE SUPP: 650.0000 mg | Freq: Four times a day (QID) | RECTAL | Status: DC | PRN

## 2021-04-26 MED ORDER — NOREPINEPHRINE 16 MG/250ML-% IV SOLN
0.0000 ug/min | INTRAVENOUS | Status: DC
  Administered 2021-04-26: 2 ug/min via INTRAVENOUS
  Filled 2021-04-26: qty 250

## 2021-04-26 MED ORDER — CHLORHEXIDINE GLUCONATE CLOTH 2 % EX PADS
6.0000 | MEDICATED_PAD | Freq: Every day | CUTANEOUS | Status: DC
  Administered 2021-04-27 – 2021-05-10 (×14): 6 via TOPICAL
  Filled 2021-04-26 (×2): qty 6

## 2021-04-26 MED ORDER — LACTATED RINGERS IV BOLUS
500.0000 mL | Freq: Once | INTRAVENOUS | Status: AC
  Administered 2021-04-26: 500 mL via INTRAVENOUS

## 2021-04-26 MED ORDER — ALBUMIN HUMAN 25 % IV SOLN
25.0000 g | Freq: Two times a day (BID) | INTRAVENOUS | Status: AC
  Administered 2021-04-26 – 2021-04-28 (×6): 25 g via INTRAVENOUS
  Filled 2021-04-26 (×6): qty 100

## 2021-04-26 MED ORDER — MIDODRINE HCL 5 MG PO TABS: 10.0000 mg | ORAL_TABLET | Freq: Three times a day (TID) | ORAL | Status: DC

## 2021-04-26 MED ORDER — MAGNESIUM SULFATE 2 GM/50ML IV SOLN
2.0000 g | Freq: Once | INTRAVENOUS | Status: AC
  Administered 2021-04-26: 2 g via INTRAVENOUS
  Filled 2021-04-26 (×2): qty 50

## 2021-04-26 MED ORDER — HEPARIN SODIUM (PORCINE) 1000 UNIT/ML DIALYSIS
1000.0000 [IU] | INTRAMUSCULAR | Status: DC | PRN
  Filled 2021-04-26: qty 6

## 2021-04-26 MED ORDER — VITAMIN D 25 MCG (1000 UNIT) PO TABS
1000.0000 [IU] | ORAL_TABLET | Freq: Every day | ORAL | Status: DC
  Administered 2021-04-27 – 2021-04-28 (×2): 1000 [IU]
  Filled 2021-04-26 (×2): qty 1

## 2021-04-26 MED ORDER — FENTANYL CITRATE (PF) 100 MCG/2ML IJ SOLN
INTRAMUSCULAR | Status: DC | PRN
  Administered 2021-04-26: 100 ug via INTRAVENOUS

## 2021-04-26 MED ORDER — POLYETHYLENE GLYCOL 3350 17 G PO PACK: 17.0000 g | PACK | Freq: Every day | ORAL | Status: DC | PRN

## 2021-04-26 MED ORDER — POLYETHYLENE GLYCOL 3350 17 G PO PACK: 17.0000 g | PACK | Freq: Every day | ORAL | Status: DC

## 2021-04-26 MED ORDER — FENTANYL CITRATE (PF) 100 MCG/2ML IJ SOLN: 25.0000 ug | INTRAMUSCULAR | Status: DC | PRN

## 2021-04-26 MED ORDER — VITAMIN B-12 1000 MCG PO TABS
1000.0000 ug | ORAL_TABLET | Freq: Every day | ORAL | Status: DC
  Administered 2021-04-27 – 2021-04-28 (×2): 1000 ug
  Filled 2021-04-26 (×2): qty 1

## 2021-04-26 MED ORDER — MIDAZOLAM HCL 2 MG/2ML IJ SOLN: 1.0000 mg | INTRAMUSCULAR | Status: DC | PRN

## 2021-04-26 MED ORDER — MIDODRINE HCL 5 MG PO TABS
10.0000 mg | ORAL_TABLET | Freq: Three times a day (TID) | ORAL | Status: DC
  Filled 2021-04-26: qty 2

## 2021-04-26 MED ORDER — PRISMASOL BGK 0/2.5 32-2.5 MEQ/L EC SOLN
Status: DC
  Filled 2021-04-26 (×23): qty 5000

## 2021-04-26 MED ORDER — DEXTROSE 50 % IV SOLN
INTRAVENOUS | Status: AC
  Filled 2021-04-26: qty 50

## 2021-04-26 MED ORDER — SODIUM CHLORIDE 0.9 % FOR CRRT
INTRAVENOUS_CENTRAL | Status: DC | PRN
  Filled 2021-04-26: qty 1000

## 2021-04-26 MED ORDER — MIDAZOLAM HCL 2 MG/2ML IJ SOLN
2.0000 mg | Freq: Once | INTRAMUSCULAR | Status: AC
  Administered 2021-04-26: 2 mg via INTRAVENOUS
  Filled 2021-04-26: qty 2

## 2021-04-26 MED ORDER — SODIUM CHLORIDE 0.9 % IV SOLN
3.0000 g | Freq: Three times a day (TID) | INTRAVENOUS | Status: DC
  Administered 2021-04-26 – 2021-04-29 (×9): 3 g via INTRAVENOUS
  Filled 2021-04-26 (×5): qty 3
  Filled 2021-04-26: qty 8
  Filled 2021-04-26 (×4): qty 3

## 2021-04-26 MED ORDER — SEVELAMER CARBONATE 800 MG PO TABS
800.0000 mg | ORAL_TABLET | Freq: Three times a day (TID) | ORAL | Status: DC
  Administered 2021-04-26 – 2021-04-27 (×4): 800 mg
  Filled 2021-04-26 (×5): qty 1

## 2021-04-26 MED ORDER — MIDODRINE HCL 5 MG PO TABS
10.0000 mg | ORAL_TABLET | Freq: Three times a day (TID) | ORAL | Status: DC
  Administered 2021-04-27 – 2021-04-28 (×4): 10 mg
  Filled 2021-04-26 (×4): qty 2

## 2021-04-26 MED ORDER — ORAL CARE MOUTH RINSE
15.0000 mL | OROMUCOSAL | Status: DC
  Administered 2021-04-26 – 2021-04-28 (×18): 15 mL via OROMUCOSAL

## 2021-04-26 MED ORDER — SODIUM CHLORIDE 0.9 % IV SOLN
3.0000 g | Freq: Two times a day (BID) | INTRAVENOUS | Status: DC
  Administered 2021-04-26: 3 g via INTRAVENOUS
  Filled 2021-04-26 (×2): qty 8

## 2021-04-26 MED ORDER — MIDAZOLAM HCL 2 MG/2ML IJ SOLN
1.0000 mg | INTRAMUSCULAR | Status: DC | PRN
  Administered 2021-04-27 (×2): 1 mg via INTRAVENOUS
  Filled 2021-04-26 (×2): qty 2

## 2021-04-26 MED ORDER — ATORVASTATIN CALCIUM 20 MG PO TABS
20.0000 mg | ORAL_TABLET | Freq: Every evening | ORAL | Status: DC
  Administered 2021-04-27: 20 mg
  Filled 2021-04-26: qty 1

## 2021-04-26 MED ORDER — CHLORHEXIDINE GLUCONATE 0.12% ORAL RINSE (MEDLINE KIT)
15.0000 mL | Freq: Two times a day (BID) | OROMUCOSAL | Status: DC
  Administered 2021-04-26 – 2021-04-28 (×4): 15 mL via OROMUCOSAL

## 2021-04-26 MED ORDER — SODIUM CHLORIDE 0.9 % IV SOLN
50.0000 mg | Freq: Two times a day (BID) | INTRAVENOUS | Status: AC
  Administered 2021-04-26 – 2021-04-28 (×5): 50 mg via INTRAVENOUS
  Filled 2021-04-26 (×6): qty 5

## 2021-04-26 MED ORDER — LACTATED RINGERS IV SOLN
INTRAVENOUS | Status: DC | PRN
  Administered 2021-04-26: 1000 mL via INTRAVENOUS

## 2021-04-26 MED ORDER — MEGESTROL ACETATE 400 MG/10ML PO SUSP
400.0000 mg | Freq: Every day | ORAL | Status: DC
  Administered 2021-04-27 – 2021-04-28 (×2): 400 mg
  Filled 2021-04-26 (×2): qty 10

## 2021-04-26 MED ORDER — DOCUSATE SODIUM 100 MG PO CAPS: 100.0000 mg | ORAL_CAPSULE | Freq: Two times a day (BID) | ORAL | Status: DC | PRN

## 2021-04-26 MED ORDER — ROCURONIUM BROMIDE 50 MG/5ML IV SOLN
INTRAVENOUS | Status: DC | PRN
  Administered 2021-04-26: 40 mg via INTRAVENOUS

## 2021-04-26 MED ORDER — ALBUMIN HUMAN 25 % IV SOLN
25.0000 g | Freq: Two times a day (BID) | INTRAVENOUS | Status: DC
  Filled 2021-04-26: qty 100

## 2021-04-26 NOTE — ED Notes (Signed)
MD Kolluru at bedside, gave verbal order to increase fluids to 75 ml/hr d/t pt BP of 77/39. Fluid rate increased at this time.

## 2021-04-26 NOTE — ED Notes (Signed)
This RN attempted to give second set of morning medications, pt refusing to swallow water and therefore cannot take pills. This RN notified MDs Dwyane Dee and Kolluru at this time via message.

## 2021-04-26 NOTE — Progress Notes (Addendum)
[facility]  ROUNDING NOTE   Subjective:   [name] was admitted to [facility] on 04/25/2021 for Sepsis [facility]) [A41.9] Severe sepsis ([place]) [A41.9, R65.20]  Last hemodialysis treatment was 8/3 - completed her dialysis treatment. Left at 75.1 kg - 1 kg above her dry weight.   Unable to get a history from patient. She is screaming out in pain.   Hypotensive.    Objective:  Vital signs in last 24 hours:  Temp:  [98.7 F (37.1 C)-103.2 F (39.6 C)] 98.7 F (37.1 C) (08/04 2300) Pulse Rate:  [56-98] 61 (08/05 1134) Resp:  [11-22] 17 (08/05 1134) BP: (77-123)/(39-58) 92/41 (08/05 1134) SpO2:  [88 %-100 %] 99 % (08/05 1134) Weight:  [52 kg] 52 kg (08/04 1609)  Weight change:  Filed Weights   04/25/21 1609  Weight: 52 kg    Intake/Output: I/O last 3 completed shifts: In: 750 [IV Piggyback:750] Out: -    Intake/Output this shift:  Total I/O In: 100 [IV Piggyback:100] Out: -   Physical Exam: General: Critically ill appearing  Head: Normocephalic, atraumatic. Moist oral mucosal membranes  Eyes: Anicteric, PERRL  Neck: Supple, trachea midline  Lungs:  Clear to auscultation  Heart: Regular rate and rhythm  Abdomen:  Soft, nontender,   Extremities: ++ peripheral edema.  Neurologic: Not following commands, responding to painful stimuli  Skin: No lesions  Access: LIJ permcath    Basic Metabolic Panel: Recent Labs  Lab 04/25/21 1651 04/25/21 1838 04/26/21 0415  NA 133*  --  131*  K 4.9  --  5.0  CL 100  --  100  CO2 24  --  24  GLUCOSE 46*  --  103*  BUN 14  --  18  CREATININE 3.63*  --  4.04*  CALCIUM 8.0*  --  7.0*  MG 1.7 1.7 1.6*  PHOS  --   --  3.9    Liver Function Tests: Recent Labs  Lab 04/25/21 1651 04/26/21 0415  AST 23 23  ALT 14 11  ALKPHOS 137* 100  BILITOT 0.5 0.8  PROT 7.6 5.5*  ALBUMIN 2.3* 1.6*   Recent Labs  Lab 04/25/21 1651  LIPASE 20   Recent Labs  Lab 04/26/21 0415  AMMONIA 55*    CBC: Recent Labs  Lab  04/25/21 1651 04/26/21 0415  WBC 5.7 4.1  NEUTROABS 4.5 3.4  HGB 9.1* 8.1*  HCT 28.8* 25.5*  MCV 89.7 91.1  PLT 245 188    Cardiac Enzymes: No results for input(s): CKTOTAL, CKMB, CKMBINDEX, TROPONINI in the last 168 hours.  BNP: Invalid input(s): POCBNP  CBG: Recent Labs  Lab 04/26/21 0609 04/26/21 0716 04/26/21 0823 04/26/21 0952 04/26/21 1041  GLUCAP 66* 114* 80 76 36    Microbiology: Results for orders placed or performed during the hospital encounter of 04/25/21  Culture, blood (Routine x 2)     Status: None (Preliminary result)   Collection Time: 04/25/21  4:51 PM   Specimen: BLOOD  Result Value Ref Range Status   Specimen Description BLOOD LW  Final   Special Requests   Final    BOTTLES DRAWN AEROBIC AND ANAEROBIC Blood Culture adequate volume   Culture  Setup Time   Final    GRAM VARIABLE COCCOBACILLI AEROBIC BOTTLE ONLY Organism ID to follow CRITICAL RESULT CALLED TO, READ BACK BY AND VERIFIED WITH: CHARLES SHANLEVER AT 5009 04/26/21.PMF    Culture   Final    GRAM VARIABLE COCCOBACILLI Performed at [facility],  [place], [place] [address]    Report Status PENDING  Incomplete  Blood Culture ID Panel (Reflexed)     Status: Abnormal   Collection Time: 04/25/21  4:51 PM  Result Value Ref Range Status   Enterococcus faecalis NOT DETECTED NOT DETECTED Final   Enterococcus Faecium NOT DETECTED NOT DETECTED Final   Listeria monocytogenes NOT DETECTED NOT DETECTED Final   Staphylococcus species NOT DETECTED NOT DETECTED Final   Staphylococcus aureus (BCID) NOT DETECTED NOT DETECTED Final   Staphylococcus epidermidis NOT DETECTED NOT DETECTED Final   Staphylococcus lugdunensis NOT DETECTED NOT DETECTED Final   Streptococcus species NOT DETECTED NOT DETECTED Final   Streptococcus agalactiae NOT DETECTED NOT DETECTED Final   Streptococcus pneumoniae NOT DETECTED NOT DETECTED Final   Streptococcus pyogenes NOT DETECTED NOT DETECTED  Final   A.calcoaceticus-baumannii DETECTED (A) NOT DETECTED Final    Comment: CRITICAL RESULT CALLED TO, READ BACK BY AND VERIFIED WITH: CHARLES SHANLEVER AT 1014 04/26/21.PMF    Bacteroides fragilis NOT DETECTED NOT DETECTED Final   Enterobacterales NOT DETECTED NOT DETECTED Final   Enterobacter cloacae complex NOT DETECTED NOT DETECTED Final   Escherichia coli NOT DETECTED NOT DETECTED Final   Klebsiella aerogenes NOT DETECTED NOT DETECTED Final   Klebsiella oxytoca NOT DETECTED NOT DETECTED Final   Klebsiella pneumoniae NOT DETECTED NOT DETECTED Final   Proteus species NOT DETECTED NOT DETECTED Final   Salmonella species NOT DETECTED NOT DETECTED Final   Serratia marcescens NOT DETECTED NOT DETECTED Final   Haemophilus influenzae NOT DETECTED NOT DETECTED Final   Neisseria meningitidis NOT DETECTED NOT DETECTED Final   Pseudomonas aeruginosa NOT DETECTED NOT DETECTED Final   Stenotrophomonas maltophilia NOT DETECTED NOT DETECTED Final   Candida albicans NOT DETECTED NOT DETECTED Final   Candida auris NOT DETECTED NOT DETECTED Final   Candida glabrata NOT DETECTED NOT DETECTED Final   Candida krusei NOT DETECTED NOT DETECTED Final   Candida parapsilosis NOT DETECTED NOT DETECTED Final   Candida tropicalis NOT DETECTED NOT DETECTED Final   Cryptococcus neoformans/gattii NOT DETECTED NOT DETECTED Final   CTX-M ESBL NOT DETECTED NOT DETECTED Final   Carbapenem resistance IMP NOT DETECTED NOT DETECTED Final   Carbapenem resistance KPC NOT DETECTED NOT DETECTED Final   Carbapenem resistance NDM NOT DETECTED NOT DETECTED Final   Carbapenem resistance VIM NOT DETECTED NOT DETECTED Final    Comment: Performed at [facility], [address] [place], [place] [address]  Culture, blood (Routine x 2)     Status: None (Preliminary result)   Collection Time: 04/25/21  6:38 PM   Specimen: BLOOD  Result Value Ref Range Status   Specimen Description BLOOD BLOOD LEFT WRIST  Final    Special Requests   Final    BOTTLES DRAWN AEROBIC AND ANAEROBIC Blood Culture adequate volume   Culture  Setup Time   Final    GRAM VARIABLE COCCOBACILLI AEROBIC BOTTLE ONLY CRITICAL VALUE NOTED.  VALUE IS CONSISTENT WITH PREVIOUSLY REPORTED AND CALLED VALUE.    Culture   Final    GRAM VARIABLE COCCOBACILLI Performed at [facility], [address] [place], [place] [address]    Report Status PENDING  Incomplete  Resp Panel by RT-PCR (Flu A&B, Covid) Nasopharyngeal Swab     Status: None   Collection Time: 04/25/21  6:54 PM   Specimen: Nasopharyngeal Swab; Nasopharyngeal(NP) swabs in vial transport medium  Result Value Ref Range Status   SARS Coronavirus 2 by RT PCR NEGATIVE  NEGATIVE Final    Comment: (NOTE) SARS-CoV-2 target nucleic acids are NOT DETECTED.  The SARS-CoV-2 RNA is generally detectable in upper respiratory specimens during the acute phase of infection. The lowest concentration of SARS-CoV-2 viral copies this assay can detect is 138 copies/mL. A negative result does not preclude SARS-Cov-2 infection and should not be used as the sole basis for treatment or other patient management decisions. A negative result may occur with  improper specimen collection/handling, submission of specimen other than nasopharyngeal swab, presence of viral mutation(s) within the areas targeted by this assay, and inadequate number of viral copies(<138 copies/mL). A negative result must be combined with clinical observations, patient history, and epidemiological information. The expected result is Negative.  Fact Sheet for Patients:  EntrepreneurPulse.com.au  Fact Sheet for Healthcare Providers:  IncredibleEmployment.be  This test is no t yet approved or cleared by the [place] FDA and  has been authorized for detection and/or diagnosis of SARS-CoV-2 by FDA under an Emergency Use Authorization (EUA). This EUA will remain  in effect  (meaning this test can be used) for the duration of the COVID-19 declaration under Section 564(b)(1) of the Act, 21 U.S.C.section 360bbb-3(b)(1), unless the authorization is terminated  or revoked sooner.       Influenza A by PCR NEGATIVE NEGATIVE Final   Influenza B by PCR NEGATIVE NEGATIVE Final    Comment: (NOTE) The Xpert Xpress SARS-CoV-2/FLU/RSV plus assay is intended as an aid in the diagnosis of influenza from Nasopharyngeal swab specimens and should not be used as a sole basis for treatment. Nasal washings and aspirates are unacceptable for Xpert Xpress SARS-CoV-2/FLU/RSV testing.  Fact Sheet for Patients: EntrepreneurPulse.com.au  Fact Sheet for Healthcare Providers: IncredibleEmployment.be  This test is not yet approved or cleared by the [place] FDA and has been authorized for detection and/or diagnosis of SARS-CoV-2 by FDA under an Emergency Use Authorization (EUA). This EUA will remain in effect (meaning this test can be used) for the duration of the COVID-19 declaration under Section 564(b)(1) of the Act, 21 U.S.C. section 360bbb-3(b)(1), unless the authorization is terminated or revoked.  Performed at [facility], [address] [place], [place] [address]     Coagulation Studies: Recent Labs    04/25/21 1651  LABPROT 15.1  INR 1.2    Urinalysis: No results for input(s): COLORURINE, LABSPEC, PHURINE, GLUCOSEU, HGBUR, BILIRUBINUR, KETONESUR, PROTEINUR, UROBILINOGEN, NITRITE, LEUKOCYTESUR in the last 72 hours.  Invalid input(s): APPERANCEUR    Imaging: CT ABDOMEN PELVIS WO CONTRAST  Result Date: 04/25/2021 CLINICAL DATA:  Diffuse abdominal pain EXAM: CT ABDOMEN AND PELVIS WITHOUT CONTRAST TECHNIQUE: Multidetector CT imaging of the abdomen and pelvis was performed following the standard protocol without IV contrast. COMPARISON:  CT 06/16/2020 FINDINGS: Lower chest: Lung bases demonstrate small bilateral  pleural effusions and cardiomegaly. Hazy lung densities at the bases likely due to edema. Atelectasis at both bases. Hepatobiliary: Status post cholecystectomy. No focal hepatic abnormality. No biliary dilatation. Pancreas: Unremarkable. No pancreatic ductal dilatation or surrounding inflammatory changes. Spleen: Normal in size without focal abnormality. Adrenals/Urinary Tract: Adrenal glands are normal. Atrophic native kidneys without hydronephrosis. Stable cyst in the mid left kidney. The bladder is nearly empty Stomach/Bowel: The stomach is nonenlarged. No dilated small bowel. No acute bowel wall thickening. Diverticular disease of left colon without acute wall thickening Vascular/Lymphatic: Extensive vascular calcification. Mild retroperitoneal lymph nodes measuring up to 12 mm Reproductive: Status post hysterectomy. No adnexal masses. Other: Negative for free air. Small free fluid within the  abdomen and pelvis. Extensive edema within the subcutaneous soft tissues. Musculoskeletal: No acute osseous abnormality IMPRESSION: 1. Cardiomegaly with small bilateral pleural effusions and hazy lung densities likely due to edema. Small amount of abdominopelvic ascites. Extensive edema within the subcutaneous soft tissues consistent with anasarca. 2. Native atrophic kidneys without hydronephrosis 3. Negative for bowel obstruction or bowel wall thickening. Left colon diverticular disease without acute wall thickening Electronically Signed   By: Donavan Foil M.D.   On: 04/25/2021 20:38   DG Chest 2 View  Result Date: 04/25/2021 CLINICAL DATA:  Suspected sepsis. EXAM: CHEST - 2 VIEW COMPARISON:  Chest x-ray 03/29/2021, CT chest 08/10/2020 FINDINGS: Left dialysis catheter with tips overlying the right atrium. Left subclavian vascular stent again noted in similar position. Slightly more prominent cardiac silhouette which may be due to technique in a patient with an AP sitting radiograph. Otherwise the heart size and  mediastinal contours are unchanged. No definite pericardial effusion on lateral view with limited evaluation. Question coronary stent. Bibasilar patchy airspace opacities including of the lingula. Nodule like density on lateral view. Coarsened interstitial markings with no overt pulmonary edema. Persistent bilateral trace to small volume pleural effusions. No pneumothorax. No acute osseous abnormality. IMPRESSION: 1. Persistent bilateral trace to small volume pleural effusions with associated patchy airspace opacities of bilateral lobe lower lobes and lingula likely representing infection/inflammation. Nodular-like density on lateral view in the lower lobe. Followup PA and lateral chest X-ray is recommended in 3-4 weeks following therapy to ensure resolution and exclude underlying malignancy. 2. Cardiomegaly with slightly more prominent cardiac silhouette which may be due to technique in a patient with an AP sitting radiograph. Electronically Signed   By: Iven Finn M.D.   On: 04/25/2021 17:58     Medications:    albumin human 25 g (04/26/21 1034)   ampicillin-sulbactam (UNASYN) IV     dextrose 10 % and 0.45 % NaCl 75 mL/hr at 04/26/21 1036    aspirin EC  81 mg Oral Daily   atorvastatin  20 mg Oral QPM   Chlorhexidine Gluconate Cloth  6 each Topical Q0600   cholecalciferol  1,000 Units Oral Daily   cholestyramine light  4 g Oral BID   ferrous sulfate  325 mg Oral Daily   furosemide  80 mg Oral Daily   lipase/protease/amylase  36,000 Units Oral TID AC   megestrol  400 mg Oral Daily   midodrine  10 mg Oral TID WC   sevelamer carbonate  800 mg Oral TID WC & HS   cyanocobalamin  1,000 mcg Oral Daily   acetaminophen **OR** acetaminophen, albuterol, dextrose  Assessment/ Plan:  [name] is a 60 y.o. black female with end stage renal disease on hemodialysis for greater than 14 years, hypertension, congestive heart failure, coronary artery disease, diabetes mellitus type II, who is  admitted to [facility] on 04/25/2021 for Sepsis ([place]) [A41.9] Severe sepsis ([place]) [A41.9, R65.20]   [facility] MWF [facility] Permcath 74kg   End Stage Renal Disease on dialysis: patient is too hypotensive and hemodynamically stable for intermittent hemodialysis.  - consult critical care - initiate IV albumin, PO midodrine (If patient can take PO), and vasopressors.  - renal replacement therapy when more stable or patient will need CRRT.   2. Sepsis: with hypotension and fever. Given azithromycin, ceftriaxone and pip/tazo in the ED.   - vasopressors and critical care as above.    3. Anemia of chronic kidney disease  Recent Labs  Lab Results  Component Value Date    HGB 10.4 (L) 03/31/2021    Hgb at goal Hold EPO for today   4. Secondary Hyperparathyroidism:   Recent Labs       Lab Results  Component Value Date    PTH 357 (H) 10/05/2018    CALCIUM 7.6 (L) 04/01/2021    CAION 1.09 (L) 04/13/2020    PHOS 1.9 (L) 04/01/2021      Hold phosphate binders.     LOS: 1 Ieasha Boerema 8/5/202211:43 AM    Addendum:  Patient has become hemodynamically unstable and will be intubated and placed on vasopressors. Will place patient on CRRT. Orders prepared.

## 2021-04-26 NOTE — ED Notes (Signed)
Pt stated that she felt like she could drink some orange juice. Pt provided with two cups of orange juice at this time.

## 2021-04-26 NOTE — Procedures (Signed)
Central Venous Catheter Insertion Procedure Note  [name]  [id number]  [birth year]  Date:04/26/21  Time:6:27 PM   Provider Performing:Hoover Grewe Jannette Fogo   Procedure: Insertion of Non-tunneled Central Venous Catheter(36556)with US guidance ([id number])    Indication(s) Hemodialysis  Consent Risks of the procedure as well as the alternatives and risks of each were explained to the patient and/or caregiver.  Consent for the procedure was obtained and is signed in the bedside chart  Anesthesia Topical only with 1% lidocaine   Timeout Verified patient identification, verified procedure, site/side was marked, verified correct patient position, special equipment/implants available, medications/allergies/relevant history reviewed, required imaging and test results available.  Sterile Technique Maximal sterile technique including full sterile barrier drape, hand hygiene, sterile gown, sterile gloves, mask, hair covering, sterile ultrasound probe cover (if used).  Procedure Description Area of catheter insertion was cleaned with chlorhexidine and draped in sterile fashion.   With real-time ultrasound guidance a HD catheter was placed into the right femoral vein.  Nonpulsatile blood flow and easy flushing noted in all ports.  The catheter was sutured in place and sterile dressing applied.  Complications/Tolerance None; patient tolerated the procedure well. Chest X-ray is ordered to verify placement for internal jugular or subclavian cannulation.  Chest x-ray is not ordered for femoral cannulation.  EBL Minimal  Specimen(s) None  Attempted to place right internal jugular temporary trialysis catheter, however unable to advance guidewire.  Therefore, successfully place right femoral trialysis catheter utilizing ultrasound.  Rosilyn Mings, AGNP  Pulmonary/Critical Care Pager [phone] (please enter 7 digits) PCCM Consult Pager [phone] (please enter 7 digits)

## 2021-04-26 NOTE — ED Notes (Signed)
Messaged pharmacy in regards to verifying the medication ordered.

## 2021-04-26 NOTE — ED Notes (Signed)
Messaged Dr. Eddie Dibbles in regards to pt blood sugar consistently being low. Was instructed to continue to give her the D50 that's ordered when needed.

## 2021-04-26 NOTE — Progress Notes (Signed)
PHARMACY NOTE:  ANTIMICROBIAL RENAL DOSAGE ADJUSTMENT  Consult receive for pharmacy to adjust medication as needed for CRRT.  Current antimicrobial dosage:   Unasyn 3gm every 12 hours Xerava 50mg  every 12 hours   Indication: Acinetobacter baumanii bacteremia  Estimated Creatinine Clearance: 12 mL/min (A) (by C-G formula based on SCr of 4.04 mg/dL (H)). []      On intermittent HD, scheduled: [x]      On CRRT    Antimicrobial dosage has been changed to:   Unasyn 3gm every 8 hours No change on Xerava   Thank you for allowing pharmacy to be a part of this patient's care.  Yunuen Mordan Rodriguez-Guzman PharmD, BCPS 04/26/2021 5:48 PM

## 2021-04-26 NOTE — ED Notes (Signed)
Pt requesting to be turned on her left side. PT assisted and propped over with a pillow.

## 2021-04-26 NOTE — Progress Notes (Signed)
[name] MRN: [id number] DOB: 07/03/1961   Cancelled Treatment:    Reason Eval/Treat Not Completed: Patient not medically ready PT orders received chart reviewed. Spoke with nurse who reports pt has fluid overload & is in need of dialysis but BP is very low and pt with runs of v-tach this morning with recommendations to hold PT evaluation at this time. Will f/u as able & as pt is medically appropriate.   Lavone Nian, PT, DPT 04/26/21, 11:46 AM    [name] 04/26/2021, 11:45 AM

## 2021-04-26 NOTE — ED Notes (Signed)
Pt provided with another cup of orange juice.

## 2021-04-26 NOTE — Progress Notes (Signed)
PHARMACY - PHYSICIAN COMMUNICATION CRITICAL VALUE ALERT - BLOOD CULTURE IDENTIFICATION (BCID)  [name] is an 60 y.o. female who presented to [facility] on 04/25/2021 with a chief complaint of fever and body aches  Assessment:  8/4 Blood culture with gram variable coccbacilli in both sets, BCID = [place]  Name of physician (or Provider) Contacted: Dr Dwyane Dee and Dr Delaine Lame  Current antibiotics: vancomycin and piperacillin/tazobactam  Changes to prescribed antibiotics recommended:  Recommendations accepted by provider - ID to see. Antibiotics changed to ampicillin/sulbactam  Results for orders placed or performed during the hospital encounter of 04/25/21  Blood Culture ID Panel (Reflexed) (Collected: 04/25/2021  4:51 PM)  Result Value Ref Range   Enterococcus faecalis NOT DETECTED NOT DETECTED   Enterococcus Faecium NOT DETECTED NOT DETECTED   Listeria monocytogenes NOT DETECTED NOT DETECTED   Staphylococcus species NOT DETECTED NOT DETECTED   Staphylococcus aureus (BCID) NOT DETECTED NOT DETECTED   Staphylococcus epidermidis NOT DETECTED NOT DETECTED   Staphylococcus lugdunensis NOT DETECTED NOT DETECTED   Streptococcus species NOT DETECTED NOT DETECTED   Streptococcus agalactiae NOT DETECTED NOT DETECTED   Streptococcus pneumoniae NOT DETECTED NOT DETECTED   Streptococcus pyogenes NOT DETECTED NOT DETECTED   A.calcoaceticus-baumannii DETECTED (A) NOT DETECTED   Bacteroides fragilis NOT DETECTED NOT DETECTED   Enterobacterales NOT DETECTED NOT DETECTED   Enterobacter cloacae complex NOT DETECTED NOT DETECTED   Escherichia coli NOT DETECTED NOT DETECTED   Klebsiella aerogenes NOT DETECTED NOT DETECTED   Klebsiella oxytoca NOT DETECTED NOT DETECTED   Klebsiella pneumoniae NOT DETECTED NOT DETECTED   Proteus species NOT DETECTED NOT DETECTED   Salmonella species NOT DETECTED NOT DETECTED   Serratia marcescens NOT DETECTED NOT DETECTED   Haemophilus influenzae NOT DETECTED  NOT DETECTED   Neisseria meningitidis NOT DETECTED NOT DETECTED   Pseudomonas aeruginosa NOT DETECTED NOT DETECTED   Stenotrophomonas maltophilia NOT DETECTED NOT DETECTED   Candida albicans NOT DETECTED NOT DETECTED   Candida auris NOT DETECTED NOT DETECTED   Candida glabrata NOT DETECTED NOT DETECTED   Candida krusei NOT DETECTED NOT DETECTED   Candida parapsilosis NOT DETECTED NOT DETECTED   Candida tropicalis NOT DETECTED NOT DETECTED   Cryptococcus neoformans/gattii NOT DETECTED NOT DETECTED   CTX-M ESBL NOT DETECTED NOT DETECTED   Carbapenem resistance IMP NOT DETECTED NOT DETECTED   Carbapenem resistance KPC NOT DETECTED NOT DETECTED   Carbapenem resistance NDM NOT DETECTED NOT DETECTED   Carbapenem resistance VIM NOT DETECTED NOT DETECTED   Doreene Eland, PharmD, BCPS.   Work Cell: [phone] 04/26/2021 11:15 AM

## 2021-04-26 NOTE — ED Notes (Signed)
PT repositioned on her left side.

## 2021-04-26 NOTE — Progress Notes (Signed)
  Update: per general [facility] subspecialist preference, non-emergent consult request sent to Dr. Juleen China of nephrology via Staff Message to be released at 0600 on 04/26/21. Petra Kuba of consult: requesting assistance with arranging for routine scheduled hemodialysis while an inpatient, with patient due for next routine HD session on 04/26/21 in the context of ESRD on HD (M/W/F).     Babs Bertin, DO Hospitalist

## 2021-04-26 NOTE — Progress Notes (Signed)
Pt admitted to ICU near 1700. BG 50; 1 amp of d50 given per PRN order. Pt arrived on vaso, d10 @ 75 (Increased to 85 ml/hr) and receiving 2nd bottle of albumin. Pt received 1 L LR in ED.   Right fem trialysis placed.   Foley in place, minimal cloudy yellow urine in bag.

## 2021-04-26 NOTE — ED Notes (Signed)
Pt positioned to her back.

## 2021-04-26 NOTE — ED Notes (Signed)
Contacted Dr. Damita Dunnings in regards to patients BP dropping low. Was given new orders. Will administer meds when verified by pharmacy.

## 2021-04-26 NOTE — Procedures (Signed)
Endotracheal Intubation: Patient required placement of an artificial airway secondary to Respiratory Failure  Consent: From son [name]  Hand washing performed prior to starting the procedure.   Medications administered for sedation prior to procedure:   Rocuronium 40 mg IV, Fentanyl 100 mcg IV.    A time out procedure was called and correct patient, name, & ID confirmed. Needed supplies and equipment were assembled and checked to include ETT, 10 ml syringe, Glidescope, Mac and Miller blades, suction, oxygen and bag mask valve, end tidal CO2 monitor.   Patient was positioned to align the mouth and pharynx to facilitate visualization of the glottis.   Heart rate, SpO2 and blood pressure was continuously monitored during the procedure. Pre-oxygenation was conducted prior to intubation and endotracheal tube was placed through the vocal cords into the trachea.     The artificial airway was placed under direct visualization via glidescope route using a 7.5 ETT on the first attempt.  ETT was secured at 23 cm mark.  Placement was confirmed by auscuitation of lungs with good breath sounds bilaterally and no stomach sounds.  Condensation was noted on endotracheal tube.   Pulse ox 98%.  CO2 detector in place with appropriate color change.   Complications: None .   Chest radiograph ordered and pending.   Comments: OGT placed via glidescope.   Ottie Glazier, M.D.  Pulmonary & [place]

## 2021-04-26 NOTE — H&P (Signed)
CRITICAL CARE PROGRESS NOTE    Name: [name] MRN: [id number] DOB: 08/06/1961     LOS: 1   SUBJECTIVE FINDINGS & SIGNIFICANT EVENTS    Patient description:  Patient is unresponsive during my evaluation. Son at bedside able to give details of history.  Patient has ESRD and "stage 4 heart failure", uncontrolled DM, diaabetic neuropathy.  Patient was apparently in chronic stable state was complaining of chills and was noted to have elevated temp with Tmax >102.  She was profoundly hypotensive 80/30  with HR 130s and unresponsive.    After discussion goals of care with son he wishes for patient to have mechanical intubation and remain full code.   Hx of infected AV graft and now blood cultures + acinetobacter  Lines/tubes : Negative Pressure Wound Therapy Arm Left;Upper (Active)    Microbiology/Sepsis markers: Results for orders placed or performed during the hospital encounter of 04/25/21  Culture, blood (Routine x 2)     Status: None (Preliminary result)   Collection Time: 04/25/21  4:51 PM   Specimen: BLOOD  Result Value Ref Range Status   Specimen Description BLOOD LW  Final   Special Requests   Final    BOTTLES DRAWN AEROBIC AND ANAEROBIC Blood Culture adequate volume   Culture  Setup Time   Final    GRAM VARIABLE COCCOBACILLI AEROBIC BOTTLE ONLY Organism ID to follow CRITICAL RESULT CALLED TO, READ BACK BY AND VERIFIED WITH: [facility] AT 1014 04/26/21.PMF    Culture   Final    GRAM VARIABLE COCCOBACILLI Performed at [facility], [address] [place], [place] [address]    Report Status PENDING  Incomplete  Blood Culture ID Panel (Reflexed)     Status: Abnormal   Collection Time: 04/25/21  4:51 PM  Result Value Ref Range Status   Enterococcus faecalis NOT DETECTED NOT  DETECTED Final   Enterococcus Faecium NOT DETECTED NOT DETECTED Final   Listeria monocytogenes NOT DETECTED NOT DETECTED Final   Staphylococcus species NOT DETECTED NOT DETECTED Final   Staphylococcus aureus (BCID) NOT DETECTED NOT DETECTED Final   Staphylococcus epidermidis NOT DETECTED NOT DETECTED Final   Staphylococcus lugdunensis NOT DETECTED NOT DETECTED Final   Streptococcus species NOT DETECTED NOT DETECTED Final   Streptococcus agalactiae NOT DETECTED NOT DETECTED Final   Streptococcus pneumoniae NOT DETECTED NOT DETECTED Final   Streptococcus pyogenes NOT DETECTED NOT DETECTED Final   A.calcoaceticus-baumannii DETECTED (A) NOT DETECTED Final    Comment: CRITICAL RESULT CALLED TO, READ BACK BY AND VERIFIED WITH: CHARLES SHANLEVER AT 1014 04/26/21.PMF    Bacteroides fragilis NOT DETECTED NOT DETECTED Final   Enterobacterales NOT DETECTED NOT DETECTED Final   Enterobacter cloacae complex NOT DETECTED NOT DETECTED Final   Escherichia coli NOT DETECTED NOT DETECTED Final   Klebsiella aerogenes NOT DETECTED NOT DETECTED Final   Klebsiella oxytoca NOT DETECTED NOT DETECTED Final   Klebsiella pneumoniae NOT DETECTED NOT DETECTED Final   Proteus species NOT DETECTED NOT DETECTED Final   Salmonella species NOT DETECTED NOT DETECTED Final   Serratia marcescens NOT DETECTED NOT DETECTED Final   Haemophilus influenzae NOT DETECTED NOT DETECTED Final   Neisseria meningitidis NOT DETECTED NOT DETECTED Final   Pseudomonas aeruginosa NOT DETECTED NOT DETECTED Final   Stenotrophomonas maltophilia NOT DETECTED NOT DETECTED Final   Candida albicans NOT DETECTED NOT DETECTED Final   Candida auris NOT DETECTED NOT DETECTED Final   Candida glabrata NOT DETECTED NOT DETECTED Final   Candida krusei  NOT DETECTED NOT DETECTED Final   Candida parapsilosis NOT DETECTED NOT DETECTED Final   Candida tropicalis NOT DETECTED NOT DETECTED Final   Cryptococcus neoformans/gattii NOT DETECTED NOT DETECTED  Final   CTX-M ESBL NOT DETECTED NOT DETECTED Final   Carbapenem resistance IMP NOT DETECTED NOT DETECTED Final   Carbapenem resistance KPC NOT DETECTED NOT DETECTED Final   Carbapenem resistance NDM NOT DETECTED NOT DETECTED Final   Carbapenem resistance VIM NOT DETECTED NOT DETECTED Final    Comment: Performed at [facility], [address] [place], [place] [address]  Culture, blood (Routine x 2)     Status: None (Preliminary result)   Collection Time: 04/25/21  6:38 PM   Specimen: BLOOD  Result Value Ref Range Status   Specimen Description BLOOD BLOOD LEFT WRIST  Final   Special Requests   Final    BOTTLES DRAWN AEROBIC AND ANAEROBIC Blood Culture adequate volume   Culture  Setup Time   Final    GRAM VARIABLE COCCOBACILLI AEROBIC BOTTLE ONLY CRITICAL VALUE NOTED.  VALUE IS CONSISTENT WITH PREVIOUSLY REPORTED AND CALLED VALUE.    Culture   Final    GRAM VARIABLE COCCOBACILLI Performed at [facility], [address] [place], [place] [address]    Report Status PENDING  Incomplete  Resp Panel by RT-PCR (Flu A&B, Covid) Nasopharyngeal Swab     Status: None   Collection Time: 04/25/21  6:54 PM   Specimen: Nasopharyngeal Swab; Nasopharyngeal(NP) swabs in vial transport medium  Result Value Ref Range Status   SARS Coronavirus 2 by RT PCR NEGATIVE NEGATIVE Final    Comment: (NOTE) SARS-CoV-2 target nucleic acids are NOT DETECTED.  The SARS-CoV-2 RNA is generally detectable in upper respiratory specimens during the acute phase of infection. The lowest concentration of SARS-CoV-2 viral copies this assay can detect is 138 copies/mL. A negative result does not preclude SARS-Cov-2 infection and should not be used as the sole basis for treatment or other patient management decisions. A negative result may occur with  improper specimen collection/handling, submission of specimen other than nasopharyngeal swab, presence of viral mutation(s) within the areas targeted  by this assay, and inadequate number of viral copies(<138 copies/mL). A negative result must be combined with clinical observations, patient history, and epidemiological information. The expected result is Negative.  Fact Sheet for Patients:  EntrepreneurPulse.com.au  Fact Sheet for Healthcare Providers:  IncredibleEmployment.be  This test is no t yet approved or cleared by the [place] FDA and  has been authorized for detection and/or diagnosis of SARS-CoV-2 by FDA under an Emergency Use Authorization (EUA). This EUA will remain  in effect (meaning this test can be used) for the duration of the COVID-19 declaration under Section 564(b)(1) of the Act, 21 U.S.C.section 360bbb-3(b)(1), unless the authorization is terminated  or revoked sooner.       Influenza A by PCR NEGATIVE NEGATIVE Final   Influenza B by PCR NEGATIVE NEGATIVE Final    Comment: (NOTE) The Xpert Xpress SARS-CoV-2/FLU/RSV plus assay is intended as an aid in the diagnosis of influenza from Nasopharyngeal swab specimens and should not be used as a sole basis for treatment. Nasal washings and aspirates are unacceptable for Xpert Xpress SARS-CoV-2/FLU/RSV testing.  Fact Sheet for Patients: EntrepreneurPulse.com.au  Fact Sheet for Healthcare Providers: IncredibleEmployment.be  This test is not yet approved or cleared by the [place] FDA and has been authorized for detection and/or diagnosis of SARS-CoV-2 by FDA under an Emergency Use Authorization (EUA). This EUA  will remain in effect (meaning this test can be used) for the duration of the COVID-19 declaration under Section 564(b)(1) of the Act, 21 U.S.C. section 360bbb-3(b)(1), unless the authorization is terminated or revoked.  Performed at [facility], [address], [place], [place] [address]     Anti-infectives:  Anti-infectives (From admission, onward)     Start     Dose/Rate Route Frequency Ordered Stop   04/26/21 1200  Ampicillin-Sulbactam (UNASYN) 3 g in sodium chloride 0.9 % 100 mL IVPB        3 g 200 mL/hr over 30 Minutes Intravenous Every 12 hours 04/26/21 1109     04/25/21 2300  piperacillin-tazobactam (ZOSYN) IVPB 2.25 g  Status:  Discontinued        2.25 g 100 mL/hr over 30 Minutes Intravenous Every 8 hours 04/25/21 2156 04/26/21 1109   04/25/21 2200  vancomycin (VANCOREADY) IVPB 1250 mg/250 mL        1,250 mg 166.7 mL/hr over 90 Minutes Intravenous  Once 04/25/21 2151 04/26/21 0155   04/25/21 2157  vancomycin (VANCOREADY) IVPB 500 mg/100 mL  Status:  Discontinued        500 mg 100 mL/hr over 60 Minutes Intravenous Every Dialysis 04/25/21 2158 04/26/21 1112   04/25/21 2100  cefTRIAXone (ROCEPHIN) 1 g in sodium chloride 0.9 % 100 mL IVPB        1 g 200 mL/hr over 30 Minutes Intravenous  Once 04/25/21 2059 04/25/21 2155   04/25/21 2100  azithromycin (ZITHROMAX) 500 mg in sodium chloride 0.9 % 250 mL IVPB        500 mg 250 mL/hr over 60 Minutes Intravenous  Once 04/25/21 2059 04/25/21 2256          PAST MEDICAL HISTORY   Past Medical History:  Diagnosis Date   Anemia    Anginal pain (HCC)    Anxiety    Arthritis    Asthma    Broken wrist    Bronchitis    chronic diastolic CHF 8/45/3646   Chronic kidney disease    COPD (chronic obstructive pulmonary disease) ([place])    Coronary artery disease    a. cath 2013: stenting to RCA (report not available); b. cath 2014: LM nl, pLAD 40%, mLAD nl, ost LCx 40%, mid LCx nl, pRCA 30% @ site of prior stent, mRCA 50%   Depression    Diabetes mellitus ([place])    Diabetes mellitus without complication ([place])    Diabetic neuropathy ([place])    dialysis 2006   Diverticulosis    Dizziness    Dyspnea    Elevated lipids    Environmental and seasonal allergies    ESRD (end stage renal disease) on dialysis ([place])    M-W-F   Gastroparesis    GERD (gastroesophageal reflux disease)    Headache     History of anemia due to chronic kidney disease    History of hiatal hernia    HOH (hard of hearing)    Hx of pancreatitis 2015   Hypertension    Lower extremity edema    Mitral regurgitation    a. echo 10/2013: EF 62%, noWMA, mildly dilated LA, mild to mod MR/TR, GR1DD   Myocardial infarction ([place])    Orthopnea    Parathyroid abnormality ([place])    Peripheral arterial disease ([place])    Pneumonia    Renal cancer ([place])    Renal insufficiency    Pt is on dialysis on M,W + F.   Wheezing  SURGICAL HISTORY   Past Surgical History:  Procedure Laterality Date   A/V SHUNTOGRAM Left 01/20/2018   Procedure: A/V SHUNTOGRAM;  Surgeon: Algernon Huxley, MD;  Location: [place] [facility];  Service: Cardiovascular;  Laterality: Left;   ABDOMINAL HYSTERECTOMY  1992   AMPUTATION TOE Left 10/02/2017   Procedure: AMPUTATION TOE-LEFT GREAT TOE;  Surgeon: Albertine Patricia, DPM;  Location: [facility];  Service: Podiatry;  Laterality: Left;   APPENDECTOMY     APPLICATION OF WOUND VAC N/A 11/25/2019   Procedure: APPLICATION OF WOUND VAC;  Surgeon: Katha Cabal, MD;  Location: [facility];  Service: Vascular;  Laterality: N/A;   ARTERY BIOPSY Right 10/11/2018   Procedure: BIOPSY TEMPORAL ARTERY;  Surgeon: Vickie Epley, MD;  Location: [facility];  Service: General;  Laterality: Right;   CARDIAC CATHETERIZATION Left 07/26/2015   Procedure: Left Heart Cath and Coronary Angiography;  Surgeon: Dionisio David, MD;  Location: [place] [facility];  Service: Cardiovascular;  Laterality: Left;   CATARACT EXTRACTION W/ INTRAOCULAR LENS IMPLANT Right    CATARACT EXTRACTION W/PHACO Left 03/10/2017   Procedure: CATARACT EXTRACTION PHACO AND INTRAOCULAR LENS PLACEMENT ([place]);  Surgeon: Birder Robson, MD;  Location: [facility];  Service: Ophthalmology;  Laterality: Left;  [place] 00:51.9 AP% 14.2 CDE 7.39 fluid pack lot # [id number]   CENTRAL LINE INSERTION Right 11/11/2019   Procedure: CENTRAL LINE INSERTION;  Surgeon:  Katha Cabal, MD;  Location: [facility];  Service: Vascular;  Laterality: Right;   CENTRAL LINE INSERTION  11/25/2019   Procedure: CENTRAL LINE INSERTION;  Surgeon: Katha Cabal, MD;  Location: [facility];  Service: Vascular;;   CHOLECYSTECTOMY     COLONOSCOPY WITH PROPOFOL N/A 08/12/2016   Procedure: COLONOSCOPY WITH PROPOFOL;  Surgeon: Lollie Sails, MD;  Location: [facility] ENDOSCOPY;  Service: Endoscopy;  Laterality: N/A;   DIALYSIS FISTULA CREATION Left    upper arm   dialysis grafts     DIALYSIS/PERMA CATHETER INSERTION N/A 11/14/2019   Procedure: DIALYSIS/PERMA CATHETER INSERTION;  Surgeon: Algernon Huxley, MD;  Location: [place] [facility];  Service: Cardiovascular;  Laterality: N/A;   DIALYSIS/PERMA CATHETER INSERTION N/A 02/03/2020   Procedure: DIALYSIS/PERMA CATHETER INSERTION;  Surgeon: Katha Cabal, MD;  Location: [place] [facility];  Service: Cardiovascular;  Laterality: N/A;   DIALYSIS/PERMA CATHETER INSERTION N/A 06/19/2020   Procedure: DIALYSIS/PERMA CATHETER INSERTION;  Surgeon: Katha Cabal, MD;  Location: [place] [facility];  Service: Cardiovascular;  Laterality: N/A;   DIALYSIS/PERMA CATHETER REMOVAL N/A 05/25/2020   Procedure: DIALYSIS/PERMA CATHETER REMOVAL;  Surgeon: Katha Cabal, MD;  Location: [place] [facility];  Service: Cardiovascular;  Laterality: N/A;   ESOPHAGOGASTRODUODENOSCOPY N/A 03/08/2015   Procedure: ESOPHAGOGASTRODUODENOSCOPY (EGD);  Surgeon: Manya Silvas, MD;  Location: [facility] ENDOSCOPY;  Service: Endoscopy;  Laterality: N/A;   ESOPHAGOGASTRODUODENOSCOPY (EGD) WITH PROPOFOL N/A 03/18/2016   Procedure: ESOPHAGOGASTRODUODENOSCOPY (EGD) WITH PROPOFOL;  Surgeon: [name], MD;  Location: [facility] ENDOSCOPY;  Service: Endoscopy;  Laterality: N/A;   EYE SURGERY Right 2018   FECAL TRANSPLANT N/A 08/23/2015   Procedure: FECAL TRANSPLANT;  Surgeon: Manya Silvas, MD;  Location: [facility] ENDOSCOPY;  Service: Endoscopy;  Laterality: N/A;   HAND  SURGERY Bilateral    HEMATOMA EVACUATION Left 11/25/2019   Procedure: EVACUATION HEMATOMA;  Surgeon: Katha Cabal, MD;  Location: [facility];  Service: Vascular;  Laterality: Left;   I & D EXTREMITY Left 11/25/2019   Procedure: IRRIGATION AND DEBRIDEMENT EXTREMITY;  Surgeon: Katha Cabal,  MD;  Location: [facility];  Service: Vascular;  Laterality: Left;   IR FLUORO GUIDE CV LINE RIGHT  04/06/2020   IR INJECT/THERA/INC NEEDLE/CATH/PLC EPI/CERV/THOR W/IMG  08/13/2020   IR RADIOLOGIST EVAL & MGMT  07/28/2019   IR RADIOLOGIST EVAL & MGMT  08/11/2019   LIGATION OF ARTERIOVENOUS  FISTULA Left 11/11/2019   LIGATION OF ARTERIOVENOUS  FISTULA Left 11/11/2019   Procedure: LIGATION OF ARTERIOVENOUS  FISTULA;  Surgeon: Katha Cabal, MD;  Location: [facility];  Service: Vascular;  Laterality: Left;   LIGATIONS OF HERO GRAFT Right 06/13/2020   Procedure: LIGATION / REMOVAL OF RIGHT HERO GRAFT;  Surgeon: Katha Cabal, MD;  Location: [facility];  Service: Vascular;  Laterality: Right;   PERIPHERAL VASCULAR CATHETERIZATION N/A 12/20/2015   Procedure: Thrombectomy of dialysis access versus permcath placement;  Surgeon: Algernon Huxley, MD;  Location: [place] [facility];  Service: Cardiovascular;  Laterality: N/A;   PERIPHERAL VASCULAR CATHETERIZATION N/A 12/20/2015   Procedure: A/V Shunt Intervention;  Surgeon: Algernon Huxley, MD;  Location: [place] [facility];  Service: Cardiovascular;  Laterality: N/A;   PERIPHERAL VASCULAR CATHETERIZATION N/A 12/20/2015   Procedure: A/V Shuntogram/Fistulagram;  Surgeon: Algernon Huxley, MD;  Location: [place] [facility];  Service: Cardiovascular;  Laterality: N/A;   PERIPHERAL VASCULAR CATHETERIZATION N/A 01/02/2016   Procedure: A/V Shuntogram/Fistulagram;  Surgeon: Algernon Huxley, MD;  Location: [place] [facility];  Service: Cardiovascular;  Laterality: N/A;   PERIPHERAL VASCULAR CATHETERIZATION N/A 01/02/2016   Procedure: A/V Shunt Intervention;  Surgeon: Algernon Huxley, MD;   Location: [place] [facility];  Service: Cardiovascular;  Laterality: N/A;   TEE WITHOUT CARDIOVERSION N/A 06/11/2020   Procedure: TRANSESOPHAGEAL ECHOCARDIOGRAM (TEE);  Surgeon: Teodoro Spray, MD;  Location: [facility];  Service: Cardiovascular;  Laterality: N/A;   UPPER EXTREMITY ANGIOGRAPHY Bilateral 11/27/2020   Procedure: UPPER EXTREMITY ANGIOGRAPHY;  Surgeon: Katha Cabal, MD;  Location: [place] [facility];  Service: Cardiovascular;  Laterality: Bilateral;   UPPER EXTREMITY VENOGRAPHY Right 01/18/2020   Procedure: UPPER EXTREMITY VENOGRAPHY;  Surgeon: Katha Cabal, MD;  Location: [place] [facility];  Service: Cardiovascular;  Laterality: Right;   UPPER EXTREMITY VENOGRAPHY Bilateral 11/27/2020   Procedure: UPPER EXTREMITY VENOGRAPHY;  Surgeon: Katha Cabal, MD;  Location: [place] [facility];  Service: Cardiovascular;  Laterality: Bilateral;   VASCULAR ACCESS DEVICE INSERTION Right 04/13/2020   Procedure: INSERTION OF HERO VASCULAR ACCESS DEVICE (GRAFT);  Surgeon: Katha Cabal, MD;  Location: [facility];  Service: Vascular;  Laterality: Right;     FAMILY HISTORY   Family History  Problem Relation Age of Onset   Kidney disease Mother    Diabetes Mother    Cancer Father    Kidney disease Sister      SOCIAL HISTORY   Social History   Tobacco Use   Smoking status: Never   Smokeless tobacco: Never  Vaping Use   Vaping Use: Never used  Substance Use Topics   Alcohol use: Not Currently    Comment: glass wine week per pt   Drug use: Yes    Types: Marijuana    Comment: once a day     MEDICATIONS   Current Medication:  Current Facility-Administered Medications:    acetaminophen (TYLENOL) tablet 650 mg, 650 mg, Oral, Q6H PRN **OR** acetaminophen (TYLENOL) suppository 650 mg, 650 mg, Rectal, Q6H PRN, Howerter, Justin B, DO   albumin human 25 % solution 25 g, 25 g, Intravenous, q12n4p, Kolluru,  Lurena Nida, MD, Stopped at 04/26/21 1214   albuterol  (PROVENTIL) (2.5 MG/3ML) 0.083% nebulizer solution 2.5 mg, 2.5 mg, Inhalation, Q4H PRN, Howerter, Justin B, DO   Ampicillin-Sulbactam (UNASYN) 3 g in sodium chloride 0.9 % 100 mL IVPB, 3 g, Intravenous, Q12H, Ravishankar, Joellyn Quails, MD, Stopped at 04/26/21 1229   atorvastatin (LIPITOR) tablet 20 mg, 20 mg, Oral, QPM, Howerter, Justin B, DO   Chlorhexidine Gluconate Cloth 2 % PADS 6 each, 6 each, Topical, Q0600, Kolluru, Sarath, MD   cholecalciferol (VITAMIN D3) tablet 1,000 Units, 1,000 Units, Oral, Daily, Howerter, Justin B, DO   cholestyramine light (PREVALITE) packet 4 g, 4 g, Oral, BID, Howerter, Justin B, DO   dextrose 10 % and 0.45 % NaCl infusion, , Intravenous, Continuous, Kolluru, Sarath, MD, Last Rate: 75 mL/hr at 04/26/21 1036, Rate Change at 04/26/21 1036   dextrose 50 % solution 50 mL, 1 ampule, Intravenous, Q1H PRN, Howerter, Justin B, DO, 50 mL at 04/26/21 1224   docusate sodium (COLACE) capsule 100 mg, 100 mg, Oral, BID PRN, Ottie Glazier, MD   ferrous sulfate tablet 325 mg, 325 mg, Oral, Daily, Howerter, Justin B, DO   furosemide (LASIX) tablet 80 mg, 80 mg, Oral, Daily, Howerter, Justin B, DO   heparin injection 5,000 Units, 5,000 Units, Subcutaneous, Q8H, Cosimo Schertzer, MD   lipase/protease/amylase (CREON) capsule 36,000 Units, 36,000 Units, Oral, TID AC, Howerter, Justin B, DO, 36,000 Units at 04/26/21 0820   megestrol (MEGACE) 400 MG/10ML suspension 400 mg, 400 mg, Oral, Daily, Howerter, Justin B, DO   midodrine (PROAMATINE) tablet 10 mg, 10 mg, Oral, TID WC, Kolluru, Sarath, MD   polyethylene glycol (MIRALAX / GLYCOLAX) packet 17 g, 17 g, Oral, Daily PRN, Ottie Glazier, MD   sevelamer carbonate (RENVELA) tablet 800 mg, 800 mg, Oral, TID WC & HS, Howerter, Justin B, DO, 800 mg at 04/26/21 0820   vitamin B-12 (CYANOCOBALAMIN) tablet 1,000 mcg, 1,000 mcg, Oral, Daily, Howerter, Justin B, DO  Current Outpatient Medications:    albuterol (PROVENTIL) (2.5 MG/3ML) 0.083%  nebulizer solution, Take 2.5 mg by nebulization every 4 (four) hours as needed for wheezing or shortness of breath., Disp: , Rfl:    albuterol (VENTOLIN HFA) 108 (90 Base) MCG/ACT inhaler, Inhale 2 puffs into the lungs every 4 (four) hours as needed for wheezing or shortness of breath., Disp: , Rfl:    alum & mag hydroxide-simeth (MAALOX/MYLANTA) 200-200-20 MG/5ML suspension, Take 15 mLs by mouth every 6 (six) hours as needed for indigestion or heartburn., Disp: 355 mL, Rfl: 0   amLODipine (NORVASC) 10 MG tablet, Take 1 tablet (10 mg total) by mouth daily., Disp: , Rfl:    ammonium lactate (LAC-HYDRIN) 12 % lotion, Apply 1 application topically 2 (two) times daily as needed for dry skin. , Disp: , Rfl:    aspirin EC 81 MG tablet, Take 81 mg by mouth daily., Disp: , Rfl:    atorvastatin (LIPITOR) 20 MG tablet, Take 20 mg by mouth every evening. , Disp: , Rfl:    Biotin 1 MG CAPS, Take 1 tablet by mouth daily., Disp: , Rfl:    calcium carbonate (TUMS - DOSED IN MG ELEMENTAL CALCIUM) 500 MG chewable tablet, Chew 2.5 tablets (500 mg of elemental calcium total) by mouth every 6 (six) hours as needed for indigestion., Disp: , Rfl:    Cholecalciferol (VITAMIN D3) 25 MCG (1000 UT) CAPS, Take 1,000 Units by mouth daily. , Disp: , Rfl:    cholestyramine light (PREVALITE) 4 g packet, Take  4 g by mouth 2 (two) times daily., Disp: , Rfl:    CREON 12000 units CPEP capsule, Take 36,000 Units by mouth 3 (three) times daily before meals. , Disp: , Rfl:    cyanocobalamin 1000 MCG tablet, Take 1,000 mcg by mouth daily. , Disp: , Rfl:    diphenhydrAMINE (BENADRYL) 25 MG tablet, Take 25 mg by mouth every 6 (six) hours as needed for itching., Disp: , Rfl:    epoetin alfa (EPOGEN) 10000 UNIT/ML injection, Inject 1 mL (10,000 Units total) into the vein every Monday, Wednesday, and Friday with hemodialysis., Disp: 1 mL, Rfl:    Ferrous Sulfate (IRON) 325 (65 Fe) MG TABS, Take 325 mg by mouth daily. , Disp: , Rfl:     furosemide (LASIX) 80 MG tablet, Take 1 tablet by mouth daily., Disp: , Rfl:    gabapentin (NEURONTIN) 100 MG capsule, Take 100 mg by mouth every evening. , Disp: , Rfl:    HYDROcodone-acetaminophen (NORCO/VICODIN) 5-325 MG tablet, Take 1 tablet by mouth 3 (three) times daily as needed for moderate pain or severe pain. (Patient taking differently: Take 1 tablet by mouth every 6 (six) hours as needed for moderate pain or severe pain.), Disp: 30 tablet, Rfl: 0   hydrOXYzine (ATARAX/VISTARIL) 25 MG tablet, Take 25 mg by mouth daily. , Disp: , Rfl:    lidocaine (LIDODERM) 5 %, SMARTSIG:Topical, Disp: , Rfl:    meclizine (ANTIVERT) 25 MG tablet, Take 25 mg by mouth 4 (four) times daily as needed for dizziness., Disp: , Rfl:    mirtazapine (REMERON) 7.5 MG tablet, Take 7.5 mg by mouth daily., Disp: , Rfl:    multivitamin (RENA-VIT) TABS tablet, Take 1 tablet by mouth daily. , Disp: , Rfl:    mupirocin ointment (BACTROBAN) 2 %, Apply 1 application topically daily as needed (leg rash). , Disp: , Rfl:    pantoprazole (PROTONIX) 40 MG tablet, Take 40 mg by mouth daily., Disp: , Rfl:    polyethylene glycol (MIRALAX / GLYCOLAX) 17 g packet, Take 17 g by mouth daily as needed. (Patient taking differently: Take 17 g by mouth daily as needed for mild constipation or moderate constipation.), Disp: , Rfl:    Probiotic Product (PROBIOTIC PO), Take 1 capsule by mouth daily., Disp: , Rfl:    sevelamer carbonate (RENVELA) 800 MG tablet, Take 800 mg by mouth 4 (four) times daily. (at meals and with a snack), Disp: , Rfl:    vitamin E 180 MG (400 UNITS) capsule, Take 400 Units by mouth daily., Disp: , Rfl:    hydrALAZINE (APRESOLINE) 50 MG tablet, Take 1 tablet (50 mg total) by mouth every 8 (eight) hours. (Patient not taking: Reported on 04/26/2021), Disp: , Rfl:    megestrol (MEGACE) 400 MG/10ML suspension, Take 10 mLs (400 mg total) by mouth daily. (Patient not taking: Reported on 04/26/2021), Disp: 240 mL, Rfl: 1    nitroGLYCERIN (NITROSTAT) 0.4 MG SL tablet, Place 0.4 mg under the tongue every 5 (five) minutes as needed for chest pain. , Disp: , Rfl:    Nutritional Supplements (FEEDING SUPPLEMENT, NEPRO CARB STEADY,) LIQD, Take 237 mLs by mouth 2 (two) times daily between meals., Disp: , Rfl: 0   Omega-3 300 MG CAPS, Take 300 mg by mouth 2 (two) times daily. Every other day opposite omega XL, Disp: , Rfl:     ALLERGIES   Ace inhibitors, Ativan [lorazepam], Compazine [prochlorperazine edisylate], Sumatriptan succinate, Zofran [ondansetron], Losartan, Prochlorperazine, Reglan [metoclopramide], Scopolamine, Tape, Tapentadol, and Ultrasound gel  REVIEW OF SYSTEMS   Unable to obtain ROS due to unresponsive state  PHYSICAL EXAMINATION   Vital Signs: Temp:  [98.7 F (37.1 C)-103.2 F (39.6 C)] 98.7 F (37.1 C) (08/04 2300) Pulse Rate:  [56-98] 60 (08/05 1326) Resp:  [11-22] 19 (08/05 1326) BP: (77-123)/(39-58) 98/46 (08/05 1326) SpO2:  [88 %-100 %] 96 % (08/05 1326) Weight:  [52 kg] 52 kg (08/04 1609)  GENERAL:chronically ill apprearing HEAD: Normocephalic, atraumatic. +alopecia EYES: Pupils equal, round, reactive to light.  No scleral icterus. +periorbital edema MOUTH: Moist mucosal membrane. NECK: Supple. No thyromegaly. No nodules. No JVD.  PULMONARY: decreased air entry bilatearlly  CARDIOVASCULAR: S1 and S2. Regular rate and rhythm. No murmurs, rubs, or gallops.  GASTROINTESTINAL: Soft, nontender, non-distended. No masses. Positive bowel sounds. No hepatosplenomegaly.  MUSCULOSKELETAL: No swelling, clubbing, or edema.  NEUROLOGIC: GCS4T SKIN:intact,warm,dry   PERTINENT DATA     Infusions:  albumin human Stopped (04/26/21 1214)   ampicillin-sulbactam (UNASYN) IV Stopped (04/26/21 1229)   dextrose 10 % and 0.45 % NaCl 75 mL/hr at 04/26/21 1036   Scheduled Medications:  atorvastatin  20 mg Oral QPM   Chlorhexidine Gluconate Cloth  6 each Topical Q0600   cholecalciferol  1,000  Units Oral Daily   cholestyramine light  4 g Oral BID   ferrous sulfate  325 mg Oral Daily   furosemide  80 mg Oral Daily   heparin  5,000 Units Subcutaneous Q8H   lipase/protease/amylase  36,000 Units Oral TID AC   megestrol  400 mg Oral Daily   midodrine  10 mg Oral TID WC   sevelamer carbonate  800 mg Oral TID WC & HS   cyanocobalamin  1,000 mcg Oral Daily   PRN Medications: acetaminophen **OR** acetaminophen, albuterol, dextrose, docusate sodium, polyethylene glycol Hemodynamic parameters:   Intake/Output: 08/04 0701 - 08/05 0700 In: 750 [IV Piggyback:750] Out: -   Ventilator  Settings:     LAB RESULTS:  Basic Metabolic Panel: Recent Labs  Lab 04/25/21 1651 04/25/21 1838 04/26/21 0415  NA 133*  --  131*  K 4.9  --  5.0  CL 100  --  100  CO2 24  --  24  GLUCOSE 46*  --  103*  BUN 14  --  18  CREATININE 3.63*  --  4.04*  CALCIUM 8.0*  --  7.0*  MG 1.7 1.7 1.6*  PHOS  --   --  3.9   Liver Function Tests: Recent Labs  Lab 04/25/21 1651 04/26/21 0415  AST 23 23  ALT 14 11  ALKPHOS 137* 100  BILITOT 0.5 0.8  PROT 7.6 5.5*  ALBUMIN 2.3* 1.6*   Recent Labs  Lab 04/25/21 1651  LIPASE 20   Recent Labs  Lab 04/26/21 0415  AMMONIA 55*   CBC: Recent Labs  Lab 04/25/21 1651 04/26/21 0415  WBC 5.7 4.1  NEUTROABS 4.5 3.4  HGB 9.1* 8.1*  HCT 28.8* 25.5*  MCV 89.7 91.1  PLT 245 188   Cardiac Enzymes: No results for input(s): CKTOTAL, CKMB, CKMBINDEX, TROPONINI in the last 168 hours. BNP: Invalid input(s): POCBNP CBG: Recent Labs  Lab 04/26/21 0716 04/26/21 0823 04/26/21 0952 04/26/21 1041 04/26/21 1220  GLUCAP 114* 80 76 70 61*       IMAGING RESULTS:  Imaging: CT ABDOMEN PELVIS WO CONTRAST  Result Date: 04/25/2021 CLINICAL DATA:  Diffuse abdominal pain EXAM: CT ABDOMEN AND PELVIS WITHOUT CONTRAST TECHNIQUE: Multidetector CT imaging of the abdomen and pelvis was performed following the standard  protocol without IV contrast.  COMPARISON:  CT 06/16/2020 FINDINGS: Lower chest: Lung bases demonstrate small bilateral pleural effusions and cardiomegaly. Hazy lung densities at the bases likely due to edema. Atelectasis at both bases. Hepatobiliary: Status post cholecystectomy. No focal hepatic abnormality. No biliary dilatation. Pancreas: Unremarkable. No pancreatic ductal dilatation or surrounding inflammatory changes. Spleen: Normal in size without focal abnormality. Adrenals/Urinary Tract: Adrenal glands are normal. Atrophic native kidneys without hydronephrosis. Stable cyst in the mid left kidney. The bladder is nearly empty Stomach/Bowel: The stomach is nonenlarged. No dilated small bowel. No acute bowel wall thickening. Diverticular disease of left colon without acute wall thickening Vascular/Lymphatic: Extensive vascular calcification. Mild retroperitoneal lymph nodes measuring up to 12 mm Reproductive: Status post hysterectomy. No adnexal masses. Other: Negative for free air. Small free fluid within the abdomen and pelvis. Extensive edema within the subcutaneous soft tissues. Musculoskeletal: No acute osseous abnormality IMPRESSION: 1. Cardiomegaly with small bilateral pleural effusions and hazy lung densities likely due to edema. Small amount of abdominopelvic ascites. Extensive edema within the subcutaneous soft tissues consistent with anasarca. 2. Native atrophic kidneys without hydronephrosis 3. Negative for bowel obstruction or bowel wall thickening. Left colon diverticular disease without acute wall thickening Electronically Signed   By: Donavan Foil M.D.   On: 04/25/2021 20:38   DG Chest 2 View  Result Date: 04/25/2021 CLINICAL DATA:  Suspected sepsis. EXAM: CHEST - 2 VIEW COMPARISON:  Chest x-ray 03/29/2021, CT chest 08/10/2020 FINDINGS: Left dialysis catheter with tips overlying the right atrium. Left subclavian vascular stent again noted in similar position. Slightly more prominent cardiac silhouette which may be due to  technique in a patient with an AP sitting radiograph. Otherwise the heart size and mediastinal contours are unchanged. No definite pericardial effusion on lateral view with limited evaluation. Question coronary stent. Bibasilar patchy airspace opacities including of the lingula. Nodule like density on lateral view. Coarsened interstitial markings with no overt pulmonary edema. Persistent bilateral trace to small volume pleural effusions. No pneumothorax. No acute osseous abnormality. IMPRESSION: 1. Persistent bilateral trace to small volume pleural effusions with associated patchy airspace opacities of bilateral lobe lower lobes and lingula likely representing infection/inflammation. Nodular-like density on lateral view in the lower lobe. Followup PA and lateral chest X-ray is recommended in 3-4 weeks following therapy to ensure resolution and exclude underlying malignancy. 2. Cardiomegaly with slightly more prominent cardiac silhouette which may be due to technique in a patient with an AP sitting radiograph. Electronically Signed   By: Iven Finn M.D.   On: 04/25/2021 17:58   @PROBHOSP @ CT ABDOMEN PELVIS WO CONTRAST  Result Date: 04/25/2021 CLINICAL DATA:  Diffuse abdominal pain EXAM: CT ABDOMEN AND PELVIS WITHOUT CONTRAST TECHNIQUE: Multidetector CT imaging of the abdomen and pelvis was performed following the standard protocol without IV contrast. COMPARISON:  CT 06/16/2020 FINDINGS: Lower chest: Lung bases demonstrate small bilateral pleural effusions and cardiomegaly. Hazy lung densities at the bases likely due to edema. Atelectasis at both bases. Hepatobiliary: Status post cholecystectomy. No focal hepatic abnormality. No biliary dilatation. Pancreas: Unremarkable. No pancreatic ductal dilatation or surrounding inflammatory changes. Spleen: Normal in size without focal abnormality. Adrenals/Urinary Tract: Adrenal glands are normal. Atrophic native kidneys without hydronephrosis. Stable cyst in the mid  left kidney. The bladder is nearly empty Stomach/Bowel: The stomach is nonenlarged. No dilated small bowel. No acute bowel wall thickening. Diverticular disease of left colon without acute wall thickening Vascular/Lymphatic: Extensive vascular calcification. Mild retroperitoneal lymph nodes measuring up to 12 mm Reproductive: Status  post hysterectomy. No adnexal masses. Other: Negative for free air. Small free fluid within the abdomen and pelvis. Extensive edema within the subcutaneous soft tissues. Musculoskeletal: No acute osseous abnormality IMPRESSION: 1. Cardiomegaly with small bilateral pleural effusions and hazy lung densities likely due to edema. Small amount of abdominopelvic ascites. Extensive edema within the subcutaneous soft tissues consistent with anasarca. 2. Native atrophic kidneys without hydronephrosis 3. Negative for bowel obstruction or bowel wall thickening. Left colon diverticular disease without acute wall thickening Electronically Signed   By: Donavan Foil M.D.   On: 04/25/2021 20:38   DG Chest 2 View  Result Date: 04/25/2021 CLINICAL DATA:  Suspected sepsis. EXAM: CHEST - 2 VIEW COMPARISON:  Chest x-ray 03/29/2021, CT chest 08/10/2020 FINDINGS: Left dialysis catheter with tips overlying the right atrium. Left subclavian vascular stent again noted in similar position. Slightly more prominent cardiac silhouette which may be due to technique in a patient with an AP sitting radiograph. Otherwise the heart size and mediastinal contours are unchanged. No definite pericardial effusion on lateral view with limited evaluation. Question coronary stent. Bibasilar patchy airspace opacities including of the lingula. Nodule like density on lateral view. Coarsened interstitial markings with no overt pulmonary edema. Persistent bilateral trace to small volume pleural effusions. No pneumothorax. No acute osseous abnormality. IMPRESSION: 1. Persistent bilateral trace to small volume pleural effusions with  associated patchy airspace opacities of bilateral lobe lower lobes and lingula likely representing infection/inflammation. Nodular-like density on lateral view in the lower lobe. Followup PA and lateral chest X-ray is recommended in 3-4 weeks following therapy to ensure resolution and exclude underlying malignancy. 2. Cardiomegaly with slightly more prominent cardiac silhouette which may be due to technique in a patient with an AP sitting radiograph. Electronically Signed   By: Iven Finn M.D.   On: 04/25/2021 17:58     ASSESSMENT AND PLAN    -Multidisciplinary rounds held today  Septic shock -present on admission - due to bacteremia with acinitebacter on blood cultures -ID on case appreciate input- Unasyn -use vasopressors to keep MAP>65 -follow ABG and LA -follow up cultures -emperic ABX -consider stress dose steroids   Chronic diastolic CHF    - repeat TTE due to bacteremia -oxygen as needed -Lasix as tolerated -follow up cardiac enzymes as indicated ICU monitoring  Renal Failure-esrd on HD -follow chem 7 -follow UO -continue Foley Catheter-assess need daily -pharmD for electrolytes -nephrology for HD   ID -continue IV abx as prescibed -follow up cultures  GI/Nutrition GI PROPHYLAXIS as indicated DIET-->TF's as tolerated Constipation protocol as indicated  ENDO - ICU hypoglycemic\Hyperglycemia protocol -check FSBS per protocol   ELECTROLYTES -follow labs as needed -replace as needed -pharmacy consultation   DVT/GI PRX ordered -SCDs  TRANSFUSIONS AS NEEDED MONITOR FSBS ASSESS the need for LABS as needed   Critical care provider statement:   Total critical care time: 109 minutes   Performed by: Lanney Gins MD   Critical care time was exclusive of separately billable procedures and treating other patients.   Critical care was necessary to treat or prevent imminent or life-threatening deterioration.   Critical care was time spent personally by me  on the following activities: development of treatment plan with patient and/or surrogate as well as nursing, discussions with consultants, evaluation of patient's response to treatment, examination of patient, obtaining history from patient or surrogate, ordering and performing treatments and interventions, ordering and review of laboratory studies, ordering and review of radiographic studies, pulse oximetry and re-evaluation of patient's condition.  This document was prepared using Dragon voice recognition software and may include unintentional dictation errors.    Merideth Bosque, M.D.  Division of Pulmonary & Critical Care Medicine  [facility]                                                                                                        

## 2021-04-26 NOTE — Consult Note (Signed)
NAME: [name]  DOB: May 30, 1961  MRN: [id number]  Date/Time: 04/26/2021 1:10 PM  REQUESTING PROVIDER: Dwyane Dee Subjective:  REASON FOR CONSULT: Acinetobacter baumanii bacteremia ?Spoke to son and chart reviewed [name] is a 60 y.o. female with a history of end-stage renal disease, COPD, CAD, diabetes mellitus, history of infected AV graft September 2021 with Enterococcus bacteremia with removal of the hero graft, osteomyelitis of the thoracic spine at T7-T8 November 2021 and treated with vancomycin and ceftazidime during dialysis for 6 weeks.Presented to the ED on 04/25/2021 via EMS with complaining of fever body aches into 1 day duration.  Patient had dialysis on 04/24/2021 and for the 2 days she has been having some body aches nausea and abdominal discomfort. In the ED vitals 103.2, pulse 98, respiratory 22, sats 98%, BP 116/52 Labs revealed WBC of 5.7, Hb of 9.1, PLT 245 and creatinine 3.63.  Blood culture sent and patient was started on azithromycin, ceftriaxone and vancomycin I.  CT abdomen showed cardiomegaly with small bilateral pleural effusion with hazy lung densities likely due to edema.  There was small amount of abdominopelvic ascites.  Extensive edema within the subcutaneous soft tissue consistent with anasarca.  Native atrophic kidneys without hydronephrosis and negative for bowel obstruction or bowel wall thickening.  There was left colonic diverticular disease without acute wall thickening.  I am asked to see the patient as the blood culture is Acinetobacter.  Pt is intubated because she has been unresponsive and hypotensive  Past Medical History:  Diagnosis Date   Anemia    Anginal pain (HCC)    Anxiety    Arthritis    Asthma    Broken wrist    Bronchitis    chronic diastolic CHF 12/29/8117   Chronic kidney disease    COPD (chronic obstructive pulmonary disease) ([place])    Coronary artery disease    a. cath 2013: stenting to RCA (report not available); b. cath 2014: LM nl, pLAD  40%, mLAD nl, ost LCx 40%, mid LCx nl, pRCA 30% @ site of prior stent, mRCA 50%   Depression    Diabetes mellitus ([place])    Diabetes mellitus without complication ([place])    Diabetic neuropathy ([place])    dialysis 2006   Diverticulosis    Dizziness    Dyspnea    Elevated lipids    Environmental and seasonal allergies    ESRD (end stage renal disease) on dialysis ([place])    M-W-F   Gastroparesis    GERD (gastroesophageal reflux disease)    Headache    History of anemia due to chronic kidney disease    History of hiatal hernia    HOH (hard of hearing)    Hx of pancreatitis 2015   Hypertension    Lower extremity edema    Mitral regurgitation    a. echo 10/2013: EF 62%, noWMA, mildly dilated LA, mild to mod MR/TR, GR1DD   Myocardial infarction ([place])    Orthopnea    Parathyroid abnormality ([place])    Peripheral arterial disease ([place])    Pneumonia    Renal cancer ([place])    Renal insufficiency    Pt is on dialysis on M,W + F.   Wheezing     Past Surgical History:  Procedure Laterality Date   A/V SHUNTOGRAM Left 01/20/2018   Procedure: A/V SHUNTOGRAM;  Surgeon: Algernon Huxley, MD;  Location: [place] [facility];  Service: Cardiovascular;  Laterality: Left;   ABDOMINAL HYSTERECTOMY  1992   AMPUTATION TOE  Left 10/02/2017   Procedure: AMPUTATION TOE-LEFT GREAT TOE;  Surgeon: Albertine Patricia, DPM;  Location: [facility];  Service: Podiatry;  Laterality: Left;   APPENDECTOMY     APPLICATION OF WOUND VAC N/A 11/25/2019   Procedure: APPLICATION OF WOUND VAC;  Surgeon: Katha Cabal, MD;  Location: [facility];  Service: Vascular;  Laterality: N/A;   ARTERY BIOPSY Right 10/11/2018   Procedure: BIOPSY TEMPORAL ARTERY;  Surgeon: Vickie Epley, MD;  Location: [facility];  Service: General;  Laterality: Right;   CARDIAC CATHETERIZATION Left 07/26/2015   Procedure: Left Heart Cath and Coronary Angiography;  Surgeon: Dionisio David, MD;  Location: [place] [facility];  Service: Cardiovascular;  Laterality:  Left;   CATARACT EXTRACTION W/ INTRAOCULAR LENS IMPLANT Right    CATARACT EXTRACTION W/PHACO Left 03/10/2017   Procedure: CATARACT EXTRACTION PHACO AND INTRAOCULAR LENS PLACEMENT ([place]);  Surgeon: Birder Robson, MD;  Location: [facility];  Service: Ophthalmology;  Laterality: Left;  [place] 00:51.9 AP% 14.2 CDE 7.39 fluid pack lot # [id number]   CENTRAL LINE INSERTION Right 11/11/2019   Procedure: CENTRAL LINE INSERTION;  Surgeon: Katha Cabal, MD;  Location: [facility];  Service: Vascular;  Laterality: Right;   CENTRAL LINE INSERTION  11/25/2019   Procedure: CENTRAL LINE INSERTION;  Surgeon: Katha Cabal, MD;  Location: [facility];  Service: Vascular;;   CHOLECYSTECTOMY     COLONOSCOPY WITH PROPOFOL N/A 08/12/2016   Procedure: COLONOSCOPY WITH PROPOFOL;  Surgeon: Lollie Sails, MD;  Location: [facility];  Service: Endoscopy;  Laterality: N/A;   DIALYSIS FISTULA CREATION Left    upper arm   dialysis grafts     DIALYSIS/PERMA CATHETER INSERTION N/A 11/14/2019   Procedure: DIALYSIS/PERMA CATHETER INSERTION;  Surgeon: Algernon Huxley, MD;  Location: [place] [facility];  Service: Cardiovascular;  Laterality: N/A;   DIALYSIS/PERMA CATHETER INSERTION N/A 02/03/2020   Procedure: DIALYSIS/PERMA CATHETER INSERTION;  Surgeon: Katha Cabal, MD;  Location: [place] [facility];  Service: Cardiovascular;  Laterality: N/A;   DIALYSIS/PERMA CATHETER INSERTION N/A 06/19/2020   Procedure: DIALYSIS/PERMA CATHETER INSERTION;  Surgeon: Katha Cabal, MD;  Location: [place] [facility];  Service: Cardiovascular;  Laterality: N/A;   DIALYSIS/PERMA CATHETER REMOVAL N/A 05/25/2020   Procedure: DIALYSIS/PERMA CATHETER REMOVAL;  Surgeon: Katha Cabal, MD;  Location: [place] [facility];  Service: Cardiovascular;  Laterality: N/A;   ESOPHAGOGASTRODUODENOSCOPY N/A 03/08/2015   Procedure: ESOPHAGOGASTRODUODENOSCOPY (EGD);  Surgeon: Manya Silvas, MD;  Location: [facility] END[facility]COPY;  Service: Endoscopy;   Laterality: N/A;   ESOPHAGOGASTRODUODENOSCOPY (EGD) WITH PROPOFOL N/A 03/18/2016   Procedure: ESOPHAGOGASTRODUODENOSCOPY (EGD) WITH PROPOFOL;  Surgeon: [name], MD;  Location: [facility];  Service: Endoscopy;  Laterality: N/A;   EYE SURGERY Right 2018   FECAL TRANSPLANT N/A 08/23/2015   Procedure: FECAL TRANSPLANT;  Surgeon: Manya Silvas, MD;  Location: [facility];  Service: Endoscopy;  Laterality: N/A;   HAND SURGERY Bilateral    HEMATOMA EVACUATION Left 11/25/2019   Procedure: EVACUATION HEMATOMA;  Surgeon: Katha Cabal, MD;  Location: [facility];  Service: Vascular;  Laterality: Left;   I & D EXTREMITY Left 11/25/2019   Procedure: IRRIGATION AND DEBRIDEMENT EXTREMITY;  Surgeon: Katha Cabal, MD;  Location: [facility];  Service: Vascular;  Laterality: Left;   IR FLUORO GUIDE CV LINE RIGHT  04/06/2020   IR INJECT/THERA/INC NEEDLE/CATH/PLC EPI/CERV/THOR W/IMG  08/13/2020   IR RADIOLOGIST EVAL & MGMT  07/28/2019   IR RADIOLOGIST EVAL & MGMT  08/11/2019  LIGATION OF ARTERIOVENOUS  FISTULA Left 11/11/2019   LIGATION OF ARTERIOVENOUS  FISTULA Left 11/11/2019   Procedure: LIGATION OF ARTERIOVENOUS  FISTULA;  Surgeon: Katha Cabal, MD;  Location: [facility];  Service: Vascular;  Laterality: Left;   LIGATIONS OF HERO GRAFT Right 06/13/2020   Procedure: LIGATION / REMOVAL OF RIGHT HERO GRAFT;  Surgeon: Katha Cabal, MD;  Location: [facility];  Service: Vascular;  Laterality: Right;   PERIPHERAL VASCULAR CATHETERIZATION N/A 12/20/2015   Procedure: Thrombectomy of dialysis access versus permcath placement;  Surgeon: Algernon Huxley, MD;  Location: [place] [facility];  Service: Cardiovascular;  Laterality: N/A;   PERIPHERAL VASCULAR CATHETERIZATION N/A 12/20/2015   Procedure: A/V Shunt Intervention;  Surgeon: Algernon Huxley, MD;  Location: [place] [facility];  Service: Cardiovascular;  Laterality: N/A;   PERIPHERAL VASCULAR CATHETERIZATION N/A 12/20/2015   Procedure: A/V  Shuntogram/Fistulagram;  Surgeon: Algernon Huxley, MD;  Location: [place] [facility];  Service: Cardiovascular;  Laterality: N/A;   PERIPHERAL VASCULAR CATHETERIZATION N/A 01/02/2016   Procedure: A/V Shuntogram/Fistulagram;  Surgeon: Algernon Huxley, MD;  Location: [place] [facility];  Service: Cardiovascular;  Laterality: N/A;   PERIPHERAL VASCULAR CATHETERIZATION N/A 01/02/2016   Procedure: A/V Shunt Intervention;  Surgeon: Algernon Huxley, MD;  Location: [place] [facility];  Service: Cardiovascular;  Laterality: N/A;   TEE WITHOUT CARDIOVERSION N/A 06/11/2020   Procedure: TRANSESOPHAGEAL ECHOCARDIOGRAM (TEE);  Surgeon: Teodoro Spray, MD;  Location: [facility];  Service: Cardiovascular;  Laterality: N/A;   UPPER EXTREMITY ANGIOGRAPHY Bilateral 11/27/2020   Procedure: UPPER EXTREMITY ANGIOGRAPHY;  Surgeon: Katha Cabal, MD;  Location: [place] [facility];  Service: Cardiovascular;  Laterality: Bilateral;   UPPER EXTREMITY VENOGRAPHY Right 01/18/2020   Procedure: UPPER EXTREMITY VENOGRAPHY;  Surgeon: Katha Cabal, MD;  Location: [place] [facility];  Service: Cardiovascular;  Laterality: Right;   UPPER EXTREMITY VENOGRAPHY Bilateral 11/27/2020   Procedure: UPPER EXTREMITY VENOGRAPHY;  Surgeon: Katha Cabal, MD;  Location: [place] [facility];  Service: Cardiovascular;  Laterality: Bilateral;   VASCULAR ACCESS DEVICE INSERTION Right 04/13/2020   Procedure: INSERTION OF HERO VASCULAR ACCESS DEVICE (GRAFT);  Surgeon: Katha Cabal, MD;  Location: [facility];  Service: Vascular;  Laterality: Right;    Social History   Socioeconomic History   Marital status: Divorced    Spouse name: Not on file   Number of children: Not on file   Years of education: Not on file   Highest education level: Not on file  Occupational History   Not on file  Tobacco Use   Smoking status: Never   Smokeless tobacco: Never  Vaping Use   Vaping Use: Never used  Substance and Sexual Activity   Alcohol use:  Not Currently    Comment: glass wine week per pt   Drug use: Yes    Types: Marijuana    Comment: once a day   Sexual activity: Never  Other Topics Concern   Not on file  Social History Narrative   ** Merged History Encounter **       Social Determinants of Health   Financial Resource Strain: Not on file  Food Insecurity: Not on file  Transportation Needs: Not on file  Physical Activity: Not on file  Stress: Not on file  Social Connections: Not on file  Intimate Partner Violence: Not on file    Family History  Problem Relation Age of Onset   Kidney disease Mother  Diabetes Mother    Cancer Father    Kidney disease Sister    Allergies  Allergen Reactions   Ace Inhibitors Swelling and Anaphylaxis   Ativan [Lorazepam] Other (See Comments)    Reaction:  Hallucinations and headaches   Compazine [Prochlorperazine Edisylate] Anaphylaxis, Nausea And Vomiting and Other (See Comments)    Other reaction(s): dystonia from this vs. Reglan, 23 Jul - patient relates that she takes promethazine frequently with no problems   Sumatriptan Succinate Other (See Comments)    Other reaction(s): delirium and hallucinations per [facility] records   Zofran [Ondansetron] Nausea And Vomiting    Per pt. she is allergic to zofran or will experience adverse reaction like hallucination    Losartan Nausea Only   Prochlorperazine Other (See Comments)    Reaction:  Unknown . Patient does not remember reaction but she does have vertigo and anxiety along with n and v at times. Could be used to treat any of these    Reglan [Metoclopramide] Other (See Comments)    Per patient her Dr. Evelina Bucy her off it    Scopolamine Other (See Comments)    Dizziness, also has vertigo already   Tape Rash    Plastic tape causes rash   Tapentadol Rash   Ultrasound Gel Itching    Patient states the Ultrasound gel caused itching while in skin contact and resolved when wiped away.   I? Current Facility-Administered Medications   Medication Dose Route Frequency Provider Last Rate Last Admin   acetaminophen (TYLENOL) tablet 650 mg  650 mg Oral Q6H PRN Howerter, Justin B, DO       Or   acetaminophen (TYLENOL) suppository 650 mg  650 mg Rectal Q6H PRN Howerter, Justin B, DO       albumin human 25 % solution 25 g  25 g Intravenous q12n4p Kolluru, Lurena Nida, MD   Stopped at 04/26/21 1214   albuterol (PROVENTIL) (2.5 MG/3ML) 0.083% nebulizer solution 2.5 mg  2.5 mg Inhalation Q4H PRN Howerter, Justin B, DO       Ampicillin-Sulbactam (UNASYN) 3 g in sodium chloride 0.9 % 100 mL IVPB  3 g Intravenous Q12H Tsosie Billing, MD   Stopped at 04/26/21 1229   aspirin EC tablet 81 mg  81 mg Oral Daily Howerter, Justin B, DO       atorvastatin (LIPITOR) tablet 20 mg  20 mg Oral QPM Howerter, Justin B, DO       Chlorhexidine Gluconate Cloth 2 % PADS 6 each  6 each Topical Q0600 Kolluru, Sarath, MD       cholecalciferol (VITAMIN D3) tablet 1,000 Units  1,000 Units Oral Daily Howerter, Justin B, DO       cholestyramine light (PREVALITE) packet 4 g  4 g Oral BID Howerter, Justin B, DO       dextrose 10 % and 0.45 % NaCl infusion   Intravenous Continuous Kolluru, Sarath, MD 75 mL/hr at 04/26/21 1036 Rate Change at 04/26/21 1036   dextrose 50 % solution 50 mL  1 ampule Intravenous Q1H PRN Howerter, Justin B, DO   50 mL at 04/26/21 1224   ferrous sulfate tablet 325 mg  325 mg Oral Daily Howerter, Justin B, DO       furosemide (LASIX) tablet 80 mg  80 mg Oral Daily Howerter, Justin B, DO       lipase/protease/amylase (CREON) capsule 36,000 Units  36,000 Units Oral TID AC Howerter, Justin B, DO   36,000 Units at 04/26/21 0820   megestrol (  MEGACE) 400 MG/10ML suspension 400 mg  400 mg Oral Daily Howerter, Justin B, DO       midodrine (PROAMATINE) tablet 10 mg  10 mg Oral TID WC Kolluru, Sarath, MD       sevelamer carbonate (RENVELA) tablet 800 mg  800 mg Oral TID WC & HS Howerter, Justin B, DO   800 mg at 04/26/21 0820   vitamin B-12  (CYANOCOBALAMIN) tablet 1,000 mcg  1,000 mcg Oral Daily Howerter, Justin B, DO       Current Outpatient Medications  Medication Sig Dispense Refill   albuterol (PROVENTIL) (2.5 MG/3ML) 0.083% nebulizer solution Take 2.5 mg by nebulization every 4 (four) hours as needed for wheezing or shortness of breath.     albuterol (VENTOLIN HFA) 108 (90 Base) MCG/ACT inhaler Inhale 2 puffs into the lungs every 4 (four) hours as needed for wheezing or shortness of breath.     alum & mag hydroxide-simeth (MAALOX/MYLANTA) 200-200-20 MG/5ML suspension Take 15 mLs by mouth every 6 (six) hours as needed for indigestion or heartburn. 355 mL 0   amLODipine (NORVASC) 10 MG tablet Take 1 tablet (10 mg total) by mouth daily.     ammonium lactate (LAC-HYDRIN) 12 % lotion Apply 1 application topically 2 (two) times daily as needed for dry skin.      Ascorbic Acid (VITA-C PO) Take 1 tablet by mouth daily.     aspirin EC 81 MG tablet Take 81 mg by mouth daily.     atorvastatin (LIPITOR) 20 MG tablet Take 20 mg by mouth every evening.      Biotin 1 MG CAPS Take 1 tablet by mouth daily.     calcium carbonate (TUMS - DOSED IN MG ELEMENTAL CALCIUM) 500 MG chewable tablet Chew 2.5 tablets (500 mg of elemental calcium total) by mouth every 6 (six) hours as needed for indigestion.     Cholecalciferol (VITAMIN D3) 25 MCG (1000 UT) CAPS Take 1,000 Units by mouth daily.      cholestyramine light (PREVALITE) 4 g packet Take 4 g by mouth 2 (two) times daily.     CREON 12000 units CPEP capsule Take 36,000 Units by mouth 3 (three) times daily before meals.      cyanocobalamin 1000 MCG tablet Take 1,000 mcg by mouth daily.      diphenhydrAMINE (BENADRYL) 25 MG tablet Take 25 mg by mouth every 6 (six) hours as needed for itching.     epoetin alfa (EPOGEN) 10000 UNIT/ML injection Inject 1 mL (10,000 Units total) into the vein every Monday, Wednesday, and Friday with hemodialysis. 1 mL    Ferrous Sulfate (IRON) 325 (65 Fe) MG TABS Take 325  mg by mouth daily.      furosemide (LASIX) 80 MG tablet Take 1 tablet by mouth daily.     gabapentin (NEURONTIN) 100 MG capsule Take 100 mg by mouth every evening.      hydrALAZINE (APRESOLINE) 50 MG tablet Take 1 tablet (50 mg total) by mouth every 8 (eight) hours.     HYDROcodone-acetaminophen (NORCO/VICODIN) 5-325 MG tablet Take 1 tablet by mouth 3 (three) times daily as needed for moderate pain or severe pain. 30 tablet 0   hydrOXYzine (ATARAX/VISTARIL) 25 MG tablet Take 25 mg by mouth daily.      lidocaine (LIDODERM) 5 % SMARTSIG:Topical     meclizine (ANTIVERT) 25 MG tablet Take 25 mg by mouth 4 (four) times daily as needed for dizziness.     megestrol (MEGACE) 400 MG/10ML suspension Take  10 mLs (400 mg total) by mouth daily. 240 mL 1   mirtazapine (REMERON) 7.5 MG tablet Take 7.5 mg by mouth daily.     multivitamin (RENA-VIT) TABS tablet Take 1 tablet by mouth daily.      mupirocin ointment (BACTROBAN) 2 % Apply 1 application topically daily as needed (leg rash).      ondansetron (ZOFRAN) 4 MG tablet Take 1 tablet (4 mg total) by mouth every 8 (eight) hours as needed for nausea or vomiting. 30 tablet 1   pantoprazole (PROTONIX) 40 MG tablet Take 40 mg by mouth daily.     polyethylene glycol (MIRALAX / GLYCOLAX) 17 g packet Take 17 g by mouth daily as needed.     Probiotic Product (PROBIOTIC PO) Take 1 capsule by mouth daily.     sevelamer carbonate (RENVELA) 800 MG tablet Take 800 mg by mouth 4 (four) times daily. (at meals and with a snack)     vitamin E 180 MG (400 UNITS) capsule Take 400 Units by mouth daily.     nitroGLYCERIN (NITROSTAT) 0.4 MG SL tablet Place 0.4 mg under the tongue every 5 (five) minutes as needed for chest pain.      Nutritional Supplements (FEEDING SUPPLEMENT, NEPRO CARB STEADY,) LIQD Take 237 mLs by mouth 2 (two) times daily between meals.  0   Omega-3 300 MG CAPS Take 300 mg by mouth 2 (two) times daily. Every other day opposite omega XL       Abtx:   Anti-infectives (From admission, onward)    Start     Dose/Rate Route Frequency Ordered Stop   04/26/21 1200  Ampicillin-Sulbactam (UNASYN) 3 g in sodium chloride 0.9 % 100 mL IVPB        3 g 200 mL/hr over 30 Minutes Intravenous Every 12 hours 04/26/21 1109     04/25/21 2300  piperacillin-tazobactam (ZOSYN) IVPB 2.25 g  Status:  Discontinued        2.25 g 100 mL/hr over 30 Minutes Intravenous Every 8 hours 04/25/21 2156 04/26/21 1109   04/25/21 2200  vancomycin (VANCOREADY) IVPB 1250 mg/250 mL        1,250 mg 166.7 mL/hr over 90 Minutes Intravenous  Once 04/25/21 2151 04/26/21 0155   04/25/21 2157  vancomycin (VANCOREADY) IVPB 500 mg/100 mL  Status:  Discontinued        500 mg 100 mL/hr over 60 Minutes Intravenous Every Dialysis 04/25/21 2158 04/26/21 1112   04/25/21 2100  cefTRIAXone (ROCEPHIN) 1 g in sodium chloride 0.9 % 100 mL IVPB        1 g 200 mL/hr over 30 Minutes Intravenous  Once 04/25/21 2059 04/25/21 2155   04/25/21 2100  azithromycin (ZITHROMAX) 500 mg in sodium chloride 0.9 % 250 mL IVPB        500 mg 250 mL/hr over 60 Minutes Intravenous  Once 04/25/21 2059 04/25/21 2256       REVIEW OF SYSTEMS:  ?NA Objective:  VITALS:  BP (!) 93/42   Pulse 62   Temp 98.7 F (37.1 C) (Oral)   Resp 19   Ht 5\' 5"  (1.651 m)   Wt 52 kg   SpO2 97%   BMI 19.08 kg/m  PHYSICAL EXAM:  General: intubated on the vent, anasarca, pale, brown think hair  Head: Normocephalic, without obvious abnormality, atraumatic. Eyes: Conjunctivae clear, anicteric sclerae. Pupils are equal ENT cannot examine Neck: Supple, symmetrical, no adenopathy, thyroid: non tender no carotid bruit and no JVD. Lungs: b/ air entry, crepts  bases Heart: tachycardia Abdomen: distended Extremities: edematous,scaly Skin: as above Lymph: Cervical, supraclavicular normal. Neurologic: cannot assess Pertinent Labs Lab Results CBC    Component Value Date/Time   WBC 4.1 04/26/2021 0415   RBC 2.80 (L)  04/26/2021 0415   HGB 8.1 (L) 04/26/2021 0415   HGB 10.5 (L) 09/15/2014 0948   HCT 25.5 (L) 04/26/2021 0415   HCT 34.2 (L) 09/15/2014 0948   PLT 188 04/26/2021 0415   PLT 203 09/15/2014 0948   MCV 91.1 04/26/2021 0415   MCV 97 09/15/2014 0948   MCH 28.9 04/26/2021 0415   MCHC 31.8 04/26/2021 0415   RDW 21.8 (H) 04/26/2021 0415   RDW 16.5 (H) 09/15/2014 0948   LYMPHSABS 0.4 (L) 04/26/2021 0415   LYMPHSABS 2.5 05/31/2014 0432   MONOABS 0.2 04/26/2021 0415   MONOABS 0.5 05/31/2014 0432   EOSABS 0.1 04/26/2021 0415   EOSABS 0.2 05/31/2014 0432   BASOSABS 0.0 04/26/2021 0415   BASOSABS 0.0 05/31/2014 0432    CMP Latest Ref Rng & Units 04/26/2021 04/25/2021 04/01/2021  Glucose 70 - 99 mg/dL 103(H) 46(L) 135(H)  BUN 8 - 23 mg/dL 18 14 43(H)  Creatinine 0.44 - 1.00 mg/dL 4.04(H) 3.63(H) 4.95(H)  Sodium 135 - 145 mmol/L 131(L) 133(L) 129(L)  Potassium 3.5 - 5.1 mmol/L 5.0 4.9 4.0  Chloride 98 - 111 mmol/L 100 100 94(L)  CO2 22 - 32 mmol/L 24 24 26   Calcium 8.9 - 10.3 mg/dL 7.0(L) 8.0(L) 7.6(L)  Total Protein 6.5 - 8.1 g/dL 5.5(L) 7.6 -  Total Bilirubin 0.3 - 1.2 mg/dL 0.8 0.5 -  Alkaline Phos 38 - 126 U/L 100 137(H) -  AST 15 - 41 U/L 23 23 -  ALT 0 - 44 U/L 11 14 -      Microbiology: Recent Results (from the past 240 hour(s))  Culture, blood (Routine x 2)     Status: None (Preliminary result)   Collection Time: 04/25/21  4:51 PM   Specimen: BLOOD  Result Value Ref Range Status   Specimen Description BLOOD LW  Final   Special Requests   Final    BOTTLES DRAWN AEROBIC AND ANAEROBIC Blood Culture adequate volume   Culture  Setup Time   Final    GRAM VARIABLE COCCOBACILLI AEROBIC BOTTLE ONLY Organism ID to follow CRITICAL RESULT CALLED TO, READ BACK BY AND VERIFIED WITH: CHARLES SHANLEVER AT 1245 04/26/21.PMF    Culture   Final    GRAM VARIABLE COCCOBACILLI Performed at [facility], [address] [place], [place] [address]    Report Status PENDING  Incomplete   Blood Culture ID Panel (Reflexed)     Status: Abnormal   Collection Time: 04/25/21  4:51 PM  Result Value Ref Range Status   Enterococcus faecalis NOT DETECTED NOT DETECTED Final   Enterococcus Faecium NOT DETECTED NOT DETECTED Final   Listeria monocytogenes NOT DETECTED NOT DETECTED Final   Staphylococcus species NOT DETECTED NOT DETECTED Final   Staphylococcus aureus (BCID) NOT DETECTED NOT DETECTED Final   Staphylococcus epidermidis NOT DETECTED NOT DETECTED Final   Staphylococcus lugdunensis NOT DETECTED NOT DETECTED Final   Streptococcus species NOT DETECTED NOT DETECTED Final   Streptococcus agalactiae NOT DETECTED NOT DETECTED Final   Streptococcus pneumoniae NOT DETECTED NOT DETECTED Final   Streptococcus pyogenes NOT DETECTED NOT DETECTED Final   A.calcoaceticus-baumannii DETECTED (A) NOT DETECTED Final    Comment: CRITICAL RESULT CALLED TO, READ BACK BY AND VERIFIED WITH: CHARLES SHANLEVER AT 1014 04/26/21.PMF  Bacteroides fragilis NOT DETECTED NOT DETECTED Final   Enterobacterales NOT DETECTED NOT DETECTED Final   Enterobacter cloacae complex NOT DETECTED NOT DETECTED Final   Escherichia coli NOT DETECTED NOT DETECTED Final   Klebsiella aerogenes NOT DETECTED NOT DETECTED Final   Klebsiella oxytoca NOT DETECTED NOT DETECTED Final   Klebsiella pneumoniae NOT DETECTED NOT DETECTED Final   Proteus species NOT DETECTED NOT DETECTED Final   Salmonella species NOT DETECTED NOT DETECTED Final   Serratia marcescens NOT DETECTED NOT DETECTED Final   Haemophilus influenzae NOT DETECTED NOT DETECTED Final   Neisseria meningitidis NOT DETECTED NOT DETECTED Final   Pseudomonas aeruginosa NOT DETECTED NOT DETECTED Final   Stenotrophomonas maltophilia NOT DETECTED NOT DETECTED Final   Candida albicans NOT DETECTED NOT DETECTED Final   Candida auris NOT DETECTED NOT DETECTED Final   Candida glabrata NOT DETECTED NOT DETECTED Final   Candida krusei NOT DETECTED NOT DETECTED Final    Candida parapsilosis NOT DETECTED NOT DETECTED Final   Candida tropicalis NOT DETECTED NOT DETECTED Final   Cryptococcus neoformans/gattii NOT DETECTED NOT DETECTED Final   CTX-M ESBL NOT DETECTED NOT DETECTED Final   Carbapenem resistance IMP NOT DETECTED NOT DETECTED Final   Carbapenem resistance KPC NOT DETECTED NOT DETECTED Final   Carbapenem resistance NDM NOT DETECTED NOT DETECTED Final   Carbapenem resistance VIM NOT DETECTED NOT DETECTED Final    Comment: Performed at [facility], [address] [place], [place] [address]  Culture, blood (Routine x 2)     Status: None (Preliminary result)   Collection Time: 04/25/21  6:38 PM   Specimen: BLOOD  Result Value Ref Range Status   Specimen Description BLOOD BLOOD LEFT WRIST  Final   Special Requests   Final    BOTTLES DRAWN AEROBIC AND ANAEROBIC Blood Culture adequate volume   Culture  Setup Time   Final    GRAM VARIABLE COCCOBACILLI AEROBIC BOTTLE ONLY CRITICAL VALUE NOTED.  VALUE IS CONSISTENT WITH PREVIOUSLY REPORTED AND CALLED VALUE.    Culture   Final    GRAM VARIABLE COCCOBACILLI Performed at [facility], [address] [place], [place] [address]    Report Status PENDING  Incomplete  Resp Panel by RT-PCR (Flu A&B, Covid) Nasopharyngeal Swab     Status: None   Collection Time: 04/25/21  6:54 PM   Specimen: Nasopharyngeal Swab; Nasopharyngeal(NP) swabs in vial transport medium  Result Value Ref Range Status   SARS Coronavirus 2 by RT PCR NEGATIVE NEGATIVE Final    Comment: (NOTE) SARS-CoV-2 target nucleic acids are NOT DETECTED.  The SARS-CoV-2 RNA is generally detectable in upper respiratory specimens during the acute phase of infection. The lowest concentration of SARS-CoV-2 viral copies this assay can detect is 138 copies/mL. A negative result does not preclude SARS-Cov-2 infection and should not be used as the sole basis for treatment or other patient management decisions. A negative result may  occur with  improper specimen collection/handling, submission of specimen other than nasopharyngeal swab, presence of viral mutation(s) within the areas targeted by this assay, and inadequate number of viral copies(<138 copies/mL). A negative result must be combined with clinical observations, patient history, and epidemiological information. The expected result is Negative.  Fact Sheet for Patients:  EntrepreneurPulse.com.au  Fact Sheet for Healthcare Providers:  IncredibleEmployment.be  This test is no t yet approved or cleared by the [place] FDA and  has been authorized for detection and/or diagnosis of SARS-CoV-2 by FDA under an Emergency Use Authorization (  EUA). This EUA will remain  in effect (meaning this test can be used) for the duration of the COVID-19 declaration under Section 564(b)(1) of the Act, 21 U.S.C.section 360bbb-3(b)(1), unless the authorization is terminated  or revoked sooner.       Influenza A by PCR NEGATIVE NEGATIVE Final   Influenza B by PCR NEGATIVE NEGATIVE Final    Comment: (NOTE) The Xpert Xpress SARS-CoV-2/FLU/RSV plus assay is intended as an aid in the diagnosis of influenza from Nasopharyngeal swab specimens and should not be used as a sole basis for treatment. Nasal washings and aspirates are unacceptable for Xpert Xpress SARS-CoV-2/FLU/RSV testing.  Fact Sheet for Patients: EntrepreneurPulse.com.au  Fact Sheet for Healthcare Providers: IncredibleEmployment.be  This test is not yet approved or cleared by the [place] FDA and has been authorized for detection and/or diagnosis of SARS-CoV-2 by FDA under an Emergency Use Authorization (EUA). This EUA will remain in effect (meaning this test can be used) for the duration of the COVID-19 declaration under Section 564(b)(1) of the Act, 21 U.S.C. section 360bbb-3(b)(1), unless the authorization is terminated  or revoked.  Performed at [facility], [address] [place], [place] [address]     IMAGING RESULTS:  I have personally reviewed the films ?catheter Impression/Recommendation ? Acinetobacter  bacteremia in both sets in the aerobic bottles.  Patient currently on Vanco and ceftriaxone.  Agree in changing to Unasyn as Acinetobacter is highly susceptible to ampicillin sulbactam. Also will add eravacycline.  She has a subclavian hemodialysis catheter which needs to be removed.  Sepsis with hypotension with encephalopathy- pt intubated  End-stage renal disease on dialysis has a subclavian catheter that likely could be the source  Patient is anuric and hardly makes any urine so sending  urine from a catheterized specimen is not a good practice.  Coronary artery disease Congestive heart failure  Hypoalbuminemia Anasarca  Anemia due to kidney disease  ? ___________________________________________________ Discussed with son and  requesting provider Note:  This document was prepared using Dragon voice recognition software and may include unintentional dictation errors.

## 2021-04-26 NOTE — ED Notes (Signed)
PT provided with more orange juice as this is the only thing she wants to take in at this time. Encouraged pt to eat her breakfast when it is brought around this morning.

## 2021-04-26 NOTE — Progress Notes (Signed)
[facility] Progress Note  Patient: [name]    JXB:[id number]  DOA: 04/25/2021     Date of Service: the patient was seen and examined on 04/26/2021  Chief Complaint  Patient presents with   Generalized Body Aches   Brief hospital course: VERBA AINLEY is a 60 y.o. 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    Provision of history is limited by the patient's somnolence.  Additionally, following history is provided via my discussions with the emergency department physician and via chart review.   The patient reports 2 days of subjective fever, with temperature max at home over that time of 103 F.  She notes associated crampy abdominal discomfort in the bilateral lower abdominal quadrants, which she states is similar in distribution, quality, and intensity relative to the abdominal discomfort that she was experiencing at the time of hospitalization to [facility] ED approximately 1 month ago at which time she was diagnosed with ESBL Ecoli urinary tract infection.  Patient also noticed generalized weakness, no focal deficits.    Assessment and Plan: Principal Problem:   Severe sepsis ([place]) Active Problems:   Diabetes mellitus, type II ([place])   Hypoglycemia associated with diabetes ([place])   Generalized weakness   ESRD (end stage renal disease) ([place])   GERD (gastroesophageal reflux disease)   Acute metabolic encephalopathy   HCAP (healthcare-associated pneumonia)     # Severe sepsis due to suspected HCAP Patient was given NS bolus in the ED, ceftriaxone and azithromycin was given for CAP Patient had a history of ESBL UTI Patient was started on Zosyn and vancomycin ID was consulted and patient was started on  Unasyn Midodrine 10 mg p.o. 3 times daily was started as per nephrology, IV albumin was given Patient needed hemodialysis, blood pressure was very low, patient was transferred to ICU/critical care for pressure support Follow pulmonary critical care and ID   # Hypoglycemia Continue IV fluid D5 NS for hydration Follow hypoglycemia protocol  # acute metabolic encephalopathy: Secondary to sepsis Continue fall precautions and supportive care   # ESRD on HD MWF schedule Nephrology consulted    # Anemia of chronic kidney disease Monitor H&H   # Hypertension Hypotensive due to sepsis, continue to monitor BP  # GERD, on PPI  Body mass index is 19.08 kg/m.  Interventions:   Pressure Injury 03/31/21 Buttocks Stage 2 -  Partial thickness loss of dermis presenting as a shallow open injury with a red, pink wound bed without slough. (Active)  03/31/21 2000  Location: Buttocks  Location Orientation:   Staging: Stage 2 -  Partial thickness loss of dermis presenting as a shallow open injury with a red, pink wound bed without slough.  Wound Description (Comments):   Present on Admission:      Diet: NPO DVT Prophylaxis: Subcutaneous Heparin    Advance goals of care discussion: Full code  Family Communication: family was NOT present at bedside, at the time of interview.  Patient was very sick unable to offer any complaints, she was completely altered  Disposition:  Pt is from Home, admitted with sepsis, transferred to ICU, which precludes a safe discharge. Discharge to home, when stable.  Subjective: Patient was admitted overnight due to sepsis, patient was unable  to offer any complaints she was very sick completely altered. Critical care was consulted and patient was transferred to ICU. Nephrology help appreciated  Physical Exam: General:  lethargic, AAOX zero, mumbling very sick.  Appear in marked distress, Eyes: Partially opened, unable to keep eyes widely open ENT: Oral  Mucosa  dry Neck: no JVD,  Cardiovascular: S1 and S2 Present, no Murmur,  Respiratory: increased respiratory effort, Bilateral Air entry equal and Decreased, mild Crackles, no significant wheezes Abdomen: Bowel Sound present, Soft and mild tenderness,  Skin: no rashes Extremities: 2-3+ Pedal edema, chronic skin changes due to venous stasis, dry skin Neurologic: Encephalopathic, unable to follow any commands Gait not checked due to patient safety concerns  Vitals:   04/26/21 1442 04/26/21 1443 04/26/21 1444 04/26/21 1445  BP: (!) 97/39 (!) 50/21 (!) 50/27 (!) 99/46  Pulse: (!) 130 (!) 114    Resp: 18  15   Temp:      TempSrc:      SpO2: 90%     Weight:      Height:        Intake/Output Summary (Last 24 hours) at 04/26/2021 1450 Last data filed at 04/26/2021 1229 Gross per 24 hour  Intake 1042.23 ml  Output --  Net 1042.23 ml   Filed Weights   04/25/21 1609  Weight: 52 kg    Data Reviewed: I have personally reviewed and interpreted daily labs, tele strips, imagings as discussed above. I reviewed all nursing notes, pharmacy notes, vitals, pertinent old records I have discussed plan of care as described above with RN and patient/family.  CBC: Recent Labs  Lab 04/25/21 1651 04/26/21 0415  WBC 5.7 4.1  NEUTROABS 4.5 3.4  HGB 9.1* 8.1*  HCT 28.8* 25.5*  MCV 89.7 91.1  PLT 245 161   Basic Metabolic Panel: Recent Labs  Lab 04/25/21 1651 04/25/21 1838 04/26/21 0415  NA 133*  --  131*  K 4.9  --  5.0  CL 100  --  100  CO2 24  --  24  GLUCOSE 46*  --  103*  BUN 14  --  18  CREATININE 3.63*  --  4.04*  CALCIUM 8.0*  --  7.0*  MG 1.7 1.7 1.6*  PHOS  --   --  3.9    Studies: CT ABDOMEN PELVIS WO CONTRAST  Result Date: 04/25/2021 CLINICAL DATA:  Diffuse abdominal pain EXAM: CT ABDOMEN AND PELVIS WITHOUT CONTRAST TECHNIQUE: Multidetector CT imaging of the abdomen and pelvis was performed following the standard protocol without IV contrast. COMPARISON:  CT 06/16/2020  FINDINGS: Lower chest: Lung bases demonstrate small bilateral pleural effusions and cardiomegaly. Hazy lung densities at the bases likely due to edema. Atelectasis at both bases. Hepatobiliary: Status post cholecystectomy. No focal hepatic abnormality. No biliary dilatation. Pancreas: Unremarkable. No pancreatic ductal dilatation or surrounding inflammatory changes. Spleen: Normal in size without focal abnormality. Adrenals/Urinary Tract: Adrenal glands are normal. Atrophic native kidneys without hydronephrosis. Stable cyst in the mid left kidney. The bladder is nearly empty Stomach/Bowel: The stomach is nonenlarged. No dilated small bowel. No acute bowel wall thickening. Diverticular disease of left colon without acute wall thickening Vascular/Lymphatic: Extensive vascular calcification. Mild retroperitoneal lymph nodes measuring up to 12 mm Reproductive: Status post hysterectomy. No adnexal masses. Other: Negative for free air. Small free fluid within the abdomen and pelvis. Extensive edema within the subcutaneous soft tissues. Musculoskeletal: No acute osseous abnormality IMPRESSION: 1. Cardiomegaly with small bilateral pleural effusions and hazy lung densities likely  due to edema. Small amount of abdominopelvic ascites. Extensive edema within the subcutaneous soft tissues consistent with anasarca. 2. Native atrophic kidneys without hydronephrosis 3. Negative for bowel obstruction or bowel wall thickening. Left colon diverticular disease without acute wall thickening Electronically Signed   By: Donavan Foil M.D.   On: 04/25/2021 20:38   DG Chest 2 View  Result Date: 04/25/2021 CLINICAL DATA:  Suspected sepsis. EXAM: CHEST - 2 VIEW COMPARISON:  Chest x-ray 03/29/2021, CT chest 08/10/2020 FINDINGS: Left dialysis catheter with tips overlying the right atrium. Left subclavian vascular stent again noted in similar position. Slightly more prominent cardiac silhouette which may be due to technique in a patient with  an AP sitting radiograph. Otherwise the heart size and mediastinal contours are unchanged. No definite pericardial effusion on lateral view with limited evaluation. Question coronary stent. Bibasilar patchy airspace opacities including of the lingula. Nodule like density on lateral view. Coarsened interstitial markings with no overt pulmonary edema. Persistent bilateral trace to small volume pleural effusions. No pneumothorax. No acute osseous abnormality. IMPRESSION: 1. Persistent bilateral trace to small volume pleural effusions with associated patchy airspace opacities of bilateral lobe lower lobes and lingula likely representing infection/inflammation. Nodular-like density on lateral view in the lower lobe. Followup PA and lateral chest X-ray is recommended in 3-4 weeks following therapy to ensure resolution and exclude underlying malignancy. 2. Cardiomegaly with slightly more prominent cardiac silhouette which may be due to technique in a patient with an AP sitting radiograph. Electronically Signed   By: Iven Finn M.D.   On: 04/25/2021 17:58    Scheduled Meds:  atorvastatin  20 mg Oral QPM   Chlorhexidine Gluconate Cloth  6 each Topical Q0600   cholecalciferol  1,000 Units Oral Daily   cholestyramine light  4 g Oral BID   ferrous sulfate  325 mg Oral Daily   furosemide  80 mg Oral Daily   heparin  5,000 Units Subcutaneous Q8H   lipase/protease/amylase  36,000 Units Oral TID AC   megestrol  400 mg Oral Daily   midodrine  10 mg Oral TID WC   sevelamer carbonate  800 mg Oral TID WC & HS   cyanocobalamin  1,000 mcg Oral Daily   Continuous Infusions:  albumin human Stopped (04/26/21 1214)   ampicillin-sulbactam (UNASYN) IV Stopped (04/26/21 1229)   dextrose 10 % and 0.45 % NaCl 75 mL/hr at 04/26/21 1036   lactated ringers     PRN Meds: acetaminophen **OR** acetaminophen, albuterol, dextrose, docusate sodium, fentaNYL, lactated ringers, polyethylene glycol, rocuronium  Time spent: 35  minutes  Author: Val Riles. MD [facility] 04/26/2021 2:50 PM  To reach On-call, see care teams to locate the attending and reach out to them via [email or web address]. If 7PM-7AM, please contact night-coverage If you still have difficulty reaching the attending provider, please page the [facility] (Director on Call) for [facility] on amion for assistance.

## 2021-04-26 NOTE — Progress Notes (Addendum)
Stepped in to pray for patient who was not engaging. Nurse Marcene Brawn compassionately cared for and kindly explained her care and reason for continually crying out. Provided silent prayer.

## 2021-04-26 NOTE — ED Notes (Signed)
Pt once again requesting to be repositioned. Pt now on her right side.

## 2021-04-26 NOTE — Consult Note (Signed)
Pharmacy Antibiotic Note  [name] is a 60 y.o. female admitted on 04/25/2021 with fever myalgias and abdominal pain. Patient with Hx of ESRD on HD MWF with last session Wed 04/24/21. Pharmacy has been consulted for Vancomycin and Zosyn dosing. Recent Urine cultures 03/2021 growing ESBL E. Coli. Next dialysis treatment schedule for today 04/26/21 in the afternoon.  Plan: Initiate Zosyn 2.25 gram Q8H Vancomycin 1250 mg LD x 1  Vancomycin 500 mg q HD session Goal pre-HD random 15-25 Obtain random level prior to 2nd-3rd HD session Follow cultures, HD schedule  Height: 5\' 5"  (165.1 cm) Weight: 52 kg (114 lb 10.2 oz) IBW/kg (Calculated) : 57  Temp (24hrs), Avg:101 F (38.3 C), Min:98.7 F (37.1 C), Max:103.2 F (39.6 C)  Recent Labs  Lab 04/25/21 1651 04/25/21 1838 04/26/21 0415  WBC 5.7  --  4.1  CREATININE 3.63*  --  4.04*  LATICACIDVEN 1.4 1.1  --      Estimated Creatinine Clearance: 12 mL/min (A) (by C-G formula based on SCr of 4.04 mg/dL (H)).    Allergies  Allergen Reactions   Ace Inhibitors Swelling and Anaphylaxis   Ativan [Lorazepam] Other (See Comments)    Reaction:  Hallucinations and headaches   Compazine [Prochlorperazine Edisylate] Anaphylaxis, Nausea And Vomiting and Other (See Comments)    Other reaction(s): dystonia from this vs. Reglan, 23 Jul - patient relates that she takes promethazine frequently with no problems   Sumatriptan Succinate Other (See Comments)    Other reaction(s): delirium and hallucinations per [facility] records   Zofran [Ondansetron] Nausea And Vomiting    Per pt. she is allergic to zofran or will experience adverse reaction like hallucination    Losartan Nausea Only   Prochlorperazine Other (See Comments)    Reaction:  Unknown . Patient does not remember reaction but she does have vertigo and anxiety along with n and v at times. Could be used to treat any of these    Reglan [Metoclopramide] Other (See Comments)    Per patient her Dr. Evelina Bucy her  off it    Scopolamine Other (See Comments)    Dizziness, also has vertigo already   Tape Rash    Plastic tape causes rash   Tapentadol Rash   Ultrasound Gel Itching    Patient states the Ultrasound gel caused itching while in skin contact and resolved when wiped away.    Antimicrobials this admission: 8/4 Azithromycin x 1 8/4 Ceftriaxone x 1 8/4 Vancomycin >>  8/4 Zosyn >>  Dose adjustments this admission: N/a  Microbiology results: 8/4 BCx: Gram Variable 8/4 UCx: ordered    Thank you for allowing pharmacy to be a part of this patient's care.  Belvin Gauss Rodriguez-Guzman PharmD, BCPS 04/26/2021 10:54 AM

## 2021-04-27 LAB — GLUCOSE, CAPILLARY
Glucose-Capillary: 106 mg/dL — ABNORMAL HIGH (ref 70–99)
Glucose-Capillary: 106 mg/dL — ABNORMAL HIGH (ref 70–99)
Glucose-Capillary: 111 mg/dL — ABNORMAL HIGH (ref 70–99)
Glucose-Capillary: 90 mg/dL (ref 70–99)
Glucose-Capillary: 92 mg/dL (ref 70–99)
Glucose-Capillary: 99 mg/dL (ref 70–99)

## 2021-04-27 LAB — RENAL FUNCTION PANEL
Albumin: 2.3 g/dL — ABNORMAL LOW (ref 3.5–5.0)
Albumin: 2.2 g/dL — ABNORMAL LOW (ref 3.5–5.0)
Anion gap: 5 (ref 5–15)
Anion gap: 8 (ref 5–15)
BUN: 14 mg/dL (ref 8–23)
BUN: 10 mg/dL (ref 8–23)
CO2: 26 mmol/L (ref 22–32)
CO2: 25 mmol/L (ref 22–32)
Calcium: 8.1 mg/dL — ABNORMAL LOW (ref 8.9–10.3)
Calcium: 8.3 mg/dL — ABNORMAL LOW (ref 8.9–10.3)
Chloride: 100 mmol/L (ref 98–111)
Chloride: 100 mmol/L (ref 98–111)
Creatinine, Ser: 2.68 mg/dL — ABNORMAL HIGH (ref 0.44–1.00)
Creatinine, Ser: 2.04 mg/dL — ABNORMAL HIGH (ref 0.44–1.00)
GFR, Estimated: 20 mL/min — ABNORMAL LOW (ref >60)
GFR, Estimated: 27 mL/min — ABNORMAL LOW (ref >60)
Glucose, Bld: 119 mg/dL — ABNORMAL HIGH (ref 70–99)
Glucose, Bld: 106 mg/dL — ABNORMAL HIGH (ref 70–99)
Phosphorus: 2.5 mg/dL (ref 2.5–4.6)
Phosphorus: 2 mg/dL — ABNORMAL LOW (ref 2.5–4.6)
Potassium: 4 mmol/L (ref 3.5–5.1)
Potassium: 3.8 mmol/L (ref 3.5–5.1)
Sodium: 131 mmol/L — ABNORMAL LOW (ref 135–145)
Sodium: 133 mmol/L — ABNORMAL LOW (ref 135–145)

## 2021-04-27 LAB — CBC
HCT: 24.1 % — ABNORMAL LOW (ref 36.0–46.0)
Hemoglobin: 7.7 g/dL — ABNORMAL LOW (ref 12.0–15.0)
MCH: 28.2 pg (ref 26.0–34.0)
MCHC: 32 g/dL (ref 30.0–36.0)
MCV: 88.3 fL (ref 80.0–100.0)
Platelets: 149 10*3/uL — ABNORMAL LOW (ref 150–400)
RBC: 2.73 MIL/uL — ABNORMAL LOW (ref 3.87–5.11)
RDW: 21.7 % — ABNORMAL HIGH (ref 11.5–15.5)
WBC: 11.3 10*3/uL — ABNORMAL HIGH (ref 4.0–10.5)
nRBC: 0 % (ref 0.0–0.2)

## 2021-04-27 LAB — PROCALCITONIN: Procalcitonin: 13.35 ng/mL

## 2021-04-27 MED ORDER — FENTANYL 2500MCG IN NS 250ML (10MCG/ML) PREMIX INFUSION
0.0000 ug/h | INTRAVENOUS | Status: DC
  Administered 2021-04-27: 50 ug/h via INTRAVENOUS
  Filled 2021-04-27: qty 250

## 2021-04-27 MED ORDER — SILVER SULFADIAZINE 1 % EX CREA
TOPICAL_CREAM | Freq: Two times a day (BID) | CUTANEOUS | Status: DC
  Administered 2021-04-27 – 2021-04-30 (×4): 1 via TOPICAL
  Filled 2021-04-27 (×2): qty 25

## 2021-04-27 MED ORDER — EPOETIN ALFA 40000 UNIT/ML IJ SOLN
20000.0000 [IU] | Freq: Once | INTRAMUSCULAR | Status: AC
  Administered 2021-04-27: 20000 [IU] via SUBCUTANEOUS
  Filled 2021-04-27: qty 1

## 2021-04-27 NOTE — Progress Notes (Signed)
[facility]  ROUNDING NOTE   Subjective:   [name] was admitted to [facility] on 04/25/2021 for Sepsis [facility]) [A41.9] Severe sepsis ([place]) [A41.9, R65.20] Fever in other diseases [R50.81]  Patient remains critically ill.  She is awake off sedation and moving around CRRT continued Patient still requiring multiple pressors   Objective:  Vital signs in last 24 hours:  Temp:  [94.1 F (34.5 C)-97.9 F (36.6 C)] 94.82 F (34.9 C) (08/06 1315) Pulse Rate:  [43-130] 65 (08/06 1315) Resp:  [13-23] 17 (08/06 1315) BP: (50-141)/(21-72) 119/50 (08/06 1315) SpO2:  [90 %-100 %] 100 % (08/06 1315) FiO2 (%):  [30 %-80 %] 30 % (08/06 1132) Weight:  [82.2 kg] 82.2 kg (08/06 0418)  Weight change: 30.2 kg Filed Weights   04/25/21 1609 04/27/21 0418  Weight: 52 kg 82.2 kg    Intake/Output: I/O last 3 completed shifts: In: 4046.1 [I.V.:2406; NG/GT:90; IV Piggyback:1550.1] Out: 4718 [Other:1425]   Intake/Output this shift:  Total I/O In: 999.9 [I.V.:561.4; NG/GT:130; IV Piggyback:308.5] Out: 906 [Other:906]  Physical Exam: General: Critically ill appearing  Head: Normocephalic, atraumatic. Moist oral mucosal membranes  Lungs:  Clear to auscultation  Heart: Regular rate and rhythm  Abdomen:  Soft, nontender,   Extremities: ++ peripheral edema.  Neurologic: Awake, restless  Skin: No lesions  Access: LIJ permcath  Right IJ temporary dialysis catheter  Basic Metabolic Panel: Recent Labs  Lab 04/25/21 1651 04/25/21 1838 04/26/21 0415 04/26/21 1932 04/27/21 0417  NA 133*  --  131*  --  131*  K 4.9  --  5.0  --  4.0  CL 100  --  100  --  100  CO2 24  --  24  --  26  GLUCOSE 46*  --  103*  --  119*  BUN 14  --  18  --  14  CREATININE 3.63*  --  4.04*  --  2.68*  CALCIUM 8.0*  --  7.0*  --  8.1*  MG 1.7 1.7 1.6* 1.6*  --   PHOS  --   --  3.9  --  2.5     Liver Function Tests: Recent Labs  Lab 04/25/21 1651 04/26/21 0415 04/27/21 0417  AST 23 23  --    ALT 14 11  --   ALKPHOS 137* 100  --   BILITOT 0.5 0.8  --   PROT 7.6 5.5*  --   ALBUMIN 2.3* 1.6* 2.3*    Recent Labs  Lab 04/25/21 1651  LIPASE 20    Recent Labs  Lab 04/26/21 0415  AMMONIA 55*     CBC: Recent Labs  Lab 04/25/21 1651 04/26/21 0415 04/27/21 0417  WBC 5.7 4.1 11.3*  NEUTROABS 4.5 3.4  --   HGB 9.1* 8.1* 7.7*  HCT 28.8* 25.5* 24.1*  MCV 89.7 91.1 88.3  PLT 245 188 149*     Cardiac Enzymes: No results for input(s): CKTOTAL, CKMB, CKMBINDEX, TROPONINI in the last 168 hours.  BNP: Invalid input(s): POCBNP  CBG: Recent Labs  Lab 04/26/21 2227 04/27/21 0016 04/27/21 0416 04/27/21 0810 04/27/21 1146  GLUCAP 94 106* 111* 29 90     Microbiology: Results for orders placed or performed during the hospital encounter of 04/25/21  Culture, blood (Routine x 2)     Status: None (Preliminary result)   Collection Time: 04/25/21  4:51 PM   Specimen: BLOOD  Result Value Ref Range Status   Specimen Description   Final  BLOOD LW Performed at [facility], [address] [place], [place] [address]    Special Requests   Final    BOTTLES DRAWN AEROBIC AND ANAEROBIC Blood Culture adequate volume Performed at [facility], [address] [place], [place] [address]    Culture  Setup Time   Final    GRAM VARIABLE COCCOBACILLI AEROBIC BOTTLE ONLY Organism ID to follow CRITICAL RESULT CALLED TO, READ BACK BY AND VERIFIED WITH: [facility] AT 1014 04/26/21.PMF Performed at [facility], [address] [place], [place] [address]    Culture   Final    Lonell Grandchild VARIABLE COCCOBACILLI CULTURE REINCUBATED FOR BETTER GROWTH Performed at [facility], [place] [address] [place], [place] [address]    Report Status PENDING  Incomplete  Blood Culture ID Panel (Reflexed)     Status: Abnormal   Collection Time: 04/25/21  4:51 PM  Result Value Ref Range Status   Enterococcus faecalis NOT DETECTED NOT DETECTED Final    Enterococcus Faecium NOT DETECTED NOT DETECTED Final   Listeria monocytogenes NOT DETECTED NOT DETECTED Final   Staphylococcus species NOT DETECTED NOT DETECTED Final   Staphylococcus aureus (BCID) NOT DETECTED NOT DETECTED Final   Staphylococcus epidermidis NOT DETECTED NOT DETECTED Final   Staphylococcus lugdunensis NOT DETECTED NOT DETECTED Final   Streptococcus species NOT DETECTED NOT DETECTED Final   Streptococcus agalactiae NOT DETECTED NOT DETECTED Final   Streptococcus pneumoniae NOT DETECTED NOT DETECTED Final   Streptococcus pyogenes NOT DETECTED NOT DETECTED Final   A.calcoaceticus-baumannii DETECTED (A) NOT DETECTED Final    Comment: CRITICAL RESULT CALLED TO, READ BACK BY AND VERIFIED WITH: CHARLES SHANLEVER AT 1014 04/26/21.PMF    Bacteroides fragilis NOT DETECTED NOT DETECTED Final   Enterobacterales NOT DETECTED NOT DETECTED Final   Enterobacter cloacae complex NOT DETECTED NOT DETECTED Final   Escherichia coli NOT DETECTED NOT DETECTED Final   Klebsiella aerogenes NOT DETECTED NOT DETECTED Final   Klebsiella oxytoca NOT DETECTED NOT DETECTED Final   Klebsiella pneumoniae NOT DETECTED NOT DETECTED Final   Proteus species NOT DETECTED NOT DETECTED Final   Salmonella species NOT DETECTED NOT DETECTED Final   Serratia marcescens NOT DETECTED NOT DETECTED Final   Haemophilus influenzae NOT DETECTED NOT DETECTED Final   Neisseria meningitidis NOT DETECTED NOT DETECTED Final   Pseudomonas aeruginosa NOT DETECTED NOT DETECTED Final   Stenotrophomonas maltophilia NOT DETECTED NOT DETECTED Final   Candida albicans NOT DETECTED NOT DETECTED Final   Candida auris NOT DETECTED NOT DETECTED Final   Candida glabrata NOT DETECTED NOT DETECTED Final   Candida krusei NOT DETECTED NOT DETECTED Final   Candida parapsilosis NOT DETECTED NOT DETECTED Final   Candida tropicalis NOT DETECTED NOT DETECTED Final   Cryptococcus neoformans/gattii NOT DETECTED NOT DETECTED Final   CTX-M ESBL  NOT DETECTED NOT DETECTED Final   Carbapenem resistance IMP NOT DETECTED NOT DETECTED Final   Carbapenem resistance KPC NOT DETECTED NOT DETECTED Final   Carbapenem resistance NDM NOT DETECTED NOT DETECTED Final   Carbapenem resistance VIM NOT DETECTED NOT DETECTED Final    Comment: Performed at [facility], [address] [place], [name] [address]  Culture, blood (Routine x 2)     Status: None (Preliminary result)   Collection Time: 04/25/21  6:38 PM   Specimen: BLOOD  Result Value Ref Range Status   Specimen Description   Final    BLOOD BLOOD LEFT WRIST Performed at [facility], [address],  [place], [place] [address]    Special Requests   Final    BOTTLES DRAWN AEROBIC AND ANAEROBIC Blood Culture adequate volume Performed at [facility], [address] [place], [place] [address]    Culture  Setup Time   Final    GRAM VARIABLE COCCOBACILLI AEROBIC BOTTLE ONLY CRITICAL VALUE NOTED.  VALUE IS CONSISTENT WITH PREVIOUSLY REPORTED AND CALLED VALUE. Performed at [facility], [address] [place], [place] [address]    Culture   Final    Lonell Grandchild VARIABLE COCCOBACILLI CULTURE REINCUBATED FOR BETTER GROWTH Performed at [facility], [place] [address] [place], [place] [address]    Report Status PENDING  Incomplete  Resp Panel by RT-PCR (Flu A&B, Covid) Nasopharyngeal Swab     Status: None   Collection Time: 04/25/21  6:54 PM   Specimen: Nasopharyngeal Swab; Nasopharyngeal(NP) swabs in vial transport medium  Result Value Ref Range Status   SARS Coronavirus 2 by RT PCR NEGATIVE NEGATIVE Final    Comment: (NOTE) SARS-CoV-2 target nucleic acids are NOT DETECTED.  The SARS-CoV-2 RNA is generally detectable in upper respiratory specimens during the acute phase of infection. The lowest concentration of SARS-CoV-2 viral copies this assay can detect is 138 copies/mL. A negative result does not preclude SARS-Cov-2 infection and should not  be used as the sole basis for treatment or other patient management decisions. A negative result may occur with  improper specimen collection/handling, submission of specimen other than nasopharyngeal swab, presence of viral mutation(s) within the areas targeted by this assay, and inadequate number of viral copies(<138 copies/mL). A negative result must be combined with clinical observations, patient history, and epidemiological information. The expected result is Negative.  Fact Sheet for Patients:  EntrepreneurPulse.com.au  Fact Sheet for Healthcare Providers:  IncredibleEmployment.be  This test is no t yet approved or cleared by the [place] FDA and  has been authorized for detection and/or diagnosis of SARS-CoV-2 by FDA under an Emergency Use Authorization (EUA). This EUA will remain  in effect (meaning this test can be used) for the duration of the COVID-19 declaration under Section 564(b)(1) of the Act, 21 U.S.C.section 360bbb-3(b)(1), unless the authorization is terminated  or revoked sooner.       Influenza A by PCR NEGATIVE NEGATIVE Final   Influenza B by PCR NEGATIVE NEGATIVE Final    Comment: (NOTE) The Xpert Xpress SARS-CoV-2/FLU/RSV plus assay is intended as an aid in the diagnosis of influenza from Nasopharyngeal swab specimens and should not be used as a sole basis for treatment. Nasal washings and aspirates are unacceptable for Xpert Xpress SARS-CoV-2/FLU/RSV testing.  Fact Sheet for Patients: EntrepreneurPulse.com.au  Fact Sheet for Healthcare Providers: IncredibleEmployment.be  This test is not yet approved or cleared by the [place] FDA and has been authorized for detection and/or diagnosis of SARS-CoV-2 by FDA under an Emergency Use Authorization (EUA). This EUA will remain in effect (meaning this test can be used) for the duration of the COVID-19 declaration under Section  564(b)(1) of the Act, 21 U.S.C. section 360bbb-3(b)(1), unless the authorization is terminated or revoked.  Performed at [facility], [address] [place], [place] [address]   MRSA Next Gen by PCR, Nasal     Status: None   Collection Time: 04/26/21  6:35 PM   Specimen: Nasal Mucosa; Nasal Swab  Result Value Ref Range Status   MRSA by PCR Next Gen NOT DETECTED NOT DETECTED Final    Comment: (NOTE) The GeneXpert MRSA Assay (FDA  approved for NASAL specimens only), is one component of a comprehensive MRSA colonization surveillance program. It is not intended to diagnose MRSA infection nor to guide or monitor treatment for MRSA infections. Test performance is not FDA approved in patients less than 23 years old. Performed at [facility], [address] [place], [place] [address]     Coagulation Studies: Recent Labs    04/25/21 1651  LABPROT 15.1  INR 1.2     Urinalysis: No results for input(s): COLORURINE, LABSPEC, PHURINE, GLUCOSEU, HGBUR, BILIRUBINUR, KETONESUR, PROTEINUR, UROBILINOGEN, NITRITE, LEUKOCYTESUR in the last 72 hours.  Invalid input(s): APPERANCEUR    Imaging: CT ABDOMEN PELVIS WO CONTRAST  Result Date: 04/25/2021 CLINICAL DATA:  Diffuse abdominal pain EXAM: CT ABDOMEN AND PELVIS WITHOUT CONTRAST TECHNIQUE: Multidetector CT imaging of the abdomen and pelvis was performed following the standard protocol without IV contrast. COMPARISON:  CT 06/16/2020 FINDINGS: Lower chest: Lung bases demonstrate small bilateral pleural effusions and cardiomegaly. Hazy lung densities at the bases likely due to edema. Atelectasis at both bases. Hepatobiliary: Status post cholecystectomy. No focal hepatic abnormality. No biliary dilatation. Pancreas: Unremarkable. No pancreatic ductal dilatation or surrounding inflammatory changes. Spleen: Normal in size without focal abnormality. Adrenals/Urinary Tract: Adrenal glands are normal. Atrophic native kidneys without  hydronephrosis. Stable cyst in the mid left kidney. The bladder is nearly empty Stomach/Bowel: The stomach is nonenlarged. No dilated small bowel. No acute bowel wall thickening. Diverticular disease of left colon without acute wall thickening Vascular/Lymphatic: Extensive vascular calcification. Mild retroperitoneal lymph nodes measuring up to 12 mm Reproductive: Status post hysterectomy. No adnexal masses. Other: Negative for free air. Small free fluid within the abdomen and pelvis. Extensive edema within the subcutaneous soft tissues. Musculoskeletal: No acute osseous abnormality IMPRESSION: 1. Cardiomegaly with small bilateral pleural effusions and hazy lung densities likely due to edema. Small amount of abdominopelvic ascites. Extensive edema within the subcutaneous soft tissues consistent with anasarca. 2. Native atrophic kidneys without hydronephrosis 3. Negative for bowel obstruction or bowel wall thickening. Left colon diverticular disease without acute wall thickening Electronically Signed   By: Donavan Foil M.D.   On: 04/25/2021 20:38   DG Chest 2 View  Result Date: 04/25/2021 CLINICAL DATA:  Suspected sepsis. EXAM: CHEST - 2 VIEW COMPARISON:  Chest x-ray 03/29/2021, CT chest 08/10/2020 FINDINGS: Left dialysis catheter with tips overlying the right atrium. Left subclavian vascular stent again noted in similar position. Slightly more prominent cardiac silhouette which may be due to technique in a patient with an AP sitting radiograph. Otherwise the heart size and mediastinal contours are unchanged. No definite pericardial effusion on lateral view with limited evaluation. Question coronary stent. Bibasilar patchy airspace opacities including of the lingula. Nodule like density on lateral view. Coarsened interstitial markings with no overt pulmonary edema. Persistent bilateral trace to small volume pleural effusions. No pneumothorax. No acute osseous abnormality. IMPRESSION: 1. Persistent bilateral trace  to small volume pleural effusions with associated patchy airspace opacities of bilateral lobe lower lobes and lingula likely representing infection/inflammation. Nodular-like density on lateral view in the lower lobe. Followup PA and lateral chest X-ray is recommended in 3-4 weeks following therapy to ensure resolution and exclude underlying malignancy. 2. Cardiomegaly with slightly more prominent cardiac silhouette which may be due to technique in a patient with an AP sitting radiograph. Electronically Signed   By: Iven Finn M.D.   On: 04/25/2021 17:58   DG Abd 1 View  Result Date: 04/26/2021 CLINICAL DATA:  Check gastric catheter placement EXAM:  ABDOMEN - 1 VIEW COMPARISON:  None. FINDINGS: Scattered large and small bowel gas is noted. Contrast material is noted within the colon. Gastric catheter is noted extending into the stomach. Changes of prior cholecystectomy are seen. IMPRESSION: Gastric catheter within the stomach. Electronically Signed   By: Inez Catalina M.D.   On: 04/26/2021 19:03   DG Chest Port 1 View  Result Date: 04/26/2021 CLINICAL DATA:  Check endotracheal tube placement EXAM: PORTABLE CHEST 1 VIEW COMPARISON:  04/25/2021 FINDINGS: Cardiac shadow is enlarged but stable. Dialysis catheter is again noted on the left as well as prior vascular stenting on the left. Endotracheal tube is noted in satisfactory position. Gastric catheter is noted extending into the stomach. Persistent bibasilar densities are noted left greater than right with associated effusions. The overall appearance is stable from the prior exam. No bony abnormality is noted. IMPRESSION: Tubes and lines in satisfactory position. Persistent bibasilar opacity left greater than right. Electronically Signed   By: Inez Catalina M.D.   On: 04/26/2021 19:05     Medications:    albumin human Stopped (04/27/21 0954)   ampicillin-sulbactam (UNASYN) IV Stopped (04/27/21 1222)   dextrose 10 % and 0.45 % NaCl 85 mL/hr at 04/27/21  1300   eravacycline Stopped (04/27/21 1105)   fentaNYL infusion INTRAVENOUS 25 mcg/hr (04/27/21 1311)   lactated ringers     norepinephrine (LEVOPHED) Adult infusion Stopped (04/27/21 0413)   prismasol BGK 2/2.5 dialysis solution     prismasol BGK 2/2.5 replacement solution     prismasol BGK 2/2.5 replacement solution     vasopressin Stopped (04/27/21 1312)    atorvastatin  20 mg Per Tube QPM   chlorhexidine gluconate (MEDLINE KIT)  15 mL Mouth Rinse BID   Chlorhexidine Gluconate Cloth  6 each Topical Q0600   cholecalciferol  1,000 Units Per Tube Daily   cholestyramine light  4 g Oral BID   docusate  100 mg Per Tube BID   ferrous sulfate  325 mg Oral Daily   heparin  5,000 Units Subcutaneous Q8H   lipase/protease/amylase  36,000 Units Oral TID AC   mouth rinse  15 mL Mouth Rinse 10 times per day   megestrol  400 mg Per Tube Daily   midodrine  10 mg Per Tube TID WC   polyethylene glycol  17 g Per Tube Daily   sevelamer carbonate  800 mg Per Tube TID WC & HS   cyanocobalamin  1,000 mcg Per Tube Daily   acetaminophen **OR** acetaminophen, albuterol, dextrose, docusate sodium, fentaNYL (SUBLIMAZE) injection, heparin, lactated ringers, midazolam, midazolam, polyethylene glycol, rocuronium, sodium chloride  Assessment/ Plan:  [name] is a 60 y.o. black female with end stage renal disease on hemodialysis for greater than 14 years, hypertension, congestive heart failure, coronary artery disease, diabetes mellitus type II, who is admitted to [facility] on 04/25/2021 for Sepsis ([place]) [A41.9] Severe sepsis ([place]) [A41.9, R65.20] Fever in other diseases [R50.81]   [facility] MWF Lt Permcath 74kg   End Stage Renal Disease on dialysis: Patient is currently continued on CRRT because of hypotension, requirement for pressors and hemodynamic instability. Zero net UF  HD via temp cath  2. Sepsis: with hypotension and fever.  - permcath to be removed today - Enterococcus and Acinetobacter  bacteremia   - vasopressors and critical care as above.    3. Anemia of chronic kidney disease  Lab Results  Component Value Date   HGB 7.7 (L) 04/27/2021   Will schedule EPO  SQ weekly saturday   4. Secondary Hyperparathyroidism:    Lab Results  Component Value Date   PTH 357 (H) 10/05/2018   CALCIUM 8.1 (L) 04/27/2021   CAION 1.09 (L) 04/13/2020   PHOS 2.5 04/27/2021      Hold phosphate binders until able to have oral intake.     LOS: 2 Suzane Vanderweide 8/6/20221:49 PM

## 2021-04-27 NOTE — Progress Notes (Signed)
[name] MRN: [id number] DOB: Oct 20, 1960   Cancelled Treatment:    Reason Eval/Treat Not Completed: Patient not medically ready PT orders received, chart reviewed. Pt noted to be new to ventilator & spoke with nurse who reports pt is not appropriate for PT intervention today. Will f/u as able & as pt is medically appropriate.   Lavone Nian, PT, DPT 04/27/21, 8:47 AM    [name] 04/27/2021, 8:46 AM

## 2021-04-27 NOTE — Consult Note (Addendum)
WOC Nurse Consult Note: Patient receiving care in [place] competed remotely after review of chart and images Reason for Consult: Bilateral foot wounds Wound type: Full thickness wound to the plantar aspect of the right and left foot Pressure Injury POA: NA Measurement: Bedside RN may measure and place on flowsheet Wound bed: Beefy red moist. Skin peeling from the surrounding area Dressing procedure/placement/frequency: Apply a coat of Silvadene cream to bilateral foot wounds, cover with nonadherent gauze and wrap with Kerlix. Change daily.   Monitor the wound area(s) for worsening of condition such as: Signs/symptoms of infection, increase in size, development of or worsening of odor, development of pain, or increased pain at the affected locations.   Notify the medical team if any of these develop.  Thank you for the consult. [place] nurse will not follow at this time.   Please re-consult the [place] team if needed.  Cathlean Marseilles Tamala Julian, MSN, RN, [place], [place]le [place], [facility] Wound Treatment Associate Pager [phone]

## 2021-04-27 NOTE — Op Note (Signed)
    Patient name: [name] MRN: [id number] DOB: 1961-05-10 Sex: female   Pre-operative Diagnosis: central line spesis Post-operative diagnosis:  Same Surgeon:  Annamarie Major Procedure:   removal of tunneled left IJ central venous catheter Blood Loss:  none Specimens:  cath tip for cx  Indications:  60 yo with sepsis.  I have been asked to remove her [facility]  Procedure:  The patient is intubated and sedated.  The cath exit site was prepped sterilly.  Using sharp dissection the cuff of the catheter was exposed and dissected free.  The catheter was removed in its entirety.  The tip was sent for culture.  A pressure dressing was placed.   Theotis Burrow, M.D., [facility] [place] Office: [phone] Pager:  [phone]

## 2021-04-27 NOTE — Progress Notes (Signed)
CRITICAL CARE PROGRESS NOTE    Name: [name] MRN: [id number] DOB: 1960/11/16     LOS: 2   SUBJECTIVE FINDINGS & SIGNIFICANT EVENTS    Patient description:  Patient is unresponsive during my evaluation. Son at bedside able to give details of history.  Patient has ESRD and "stage 4 heart failure", uncontrolled DM, diaabetic neuropathy.  Patient was apparently in chronic stable state was complaining of chills and was noted to have elevated temp with Tmax >102.  She was profoundly hypotensive 80/30  with HR 130s and unresponsive.    After discussion goals of care with son he wishes for patient to have mechanical intubation and remain full code.   Hx of infected AV graft and now blood cultures + acinetobacter  04/27/21- Tunneled vasc cath removed from left chest.  Reviewed care plan with son [name]. Patient on CRRT. S/p vascular and renal evanluatoin .   Lines/tubes : Negative Pressure Wound Therapy Arm Left;Upper (Active)    Microbiology/Sepsis markers: Results for orders placed or performed during the hospital encounter of 04/25/21  Culture, blood (Routine x 2)     Status: None (Preliminary result)   Collection Time: 04/25/21  4:51 PM   Specimen: BLOOD  Result Value Ref Range Status   Specimen Description BLOOD LW  Final   Special Requests   Final    BOTTLES DRAWN AEROBIC AND ANAEROBIC Blood Culture adequate volume   Culture  Setup Time   Final    GRAM VARIABLE COCCOBACILLI AEROBIC BOTTLE ONLY Organism ID to follow CRITICAL RESULT CALLED TO, READ BACK BY AND VERIFIED WITH: [facility] AT 1014 04/26/21.PMF    Culture   Final    GRAM VARIABLE COCCOBACILLI Performed at [facility], [address] [place], [place] [address]    Report Status PENDING  Incomplete  Blood Culture ID Panel  (Reflexed)     Status: Abnormal   Collection Time: 04/25/21  4:51 PM  Result Value Ref Range Status   Enterococcus faecalis NOT DETECTED NOT DETECTED Final   Enterococcus Faecium NOT DETECTED NOT DETECTED Final   Listeria monocytogenes NOT DETECTED NOT DETECTED Final   Staphylococcus species NOT DETECTED NOT DETECTED Final   Staphylococcus aureus (BCID) NOT DETECTED NOT DETECTED Final   Staphylococcus epidermidis NOT DETECTED NOT DETECTED Final   Staphylococcus lugdunensis NOT DETECTED NOT DETECTED Final   Streptococcus species NOT DETECTED NOT DETECTED Final   Streptococcus agalactiae NOT DETECTED NOT DETECTED Final   Streptococcus pneumoniae NOT DETECTED NOT DETECTED Final   Streptococcus pyogenes NOT DETECTED NOT DETECTED Final   A.calcoaceticus-baumannii DETECTED (A) NOT DETECTED Final    Comment: CRITICAL RESULT CALLED TO, READ BACK BY AND VERIFIED WITH: CHARLES SHANLEVER AT 1014 04/26/21.PMF    Bacteroides fragilis NOT DETECTED NOT DETECTED Final   Enterobacterales NOT DETECTED NOT DETECTED Final   Enterobacter cloacae complex NOT DETECTED NOT DETECTED Final   Escherichia coli NOT DETECTED NOT DETECTED Final   Klebsiella aerogenes NOT DETECTED NOT DETECTED Final   Klebsiella oxytoca NOT DETECTED NOT DETECTED Final   Klebsiella pneumoniae NOT DETECTED NOT DETECTED Final   Proteus species NOT DETECTED NOT DETECTED Final   Salmonella species NOT DETECTED NOT DETECTED Final   Serratia marcescens NOT DETECTED NOT DETECTED Final   Haemophilus influenzae NOT DETECTED NOT DETECTED Final   Neisseria meningitidis NOT DETECTED NOT DETECTED Final   Pseudomonas aeruginosa NOT DETECTED NOT DETECTED Final   Stenotrophomonas maltophilia NOT DETECTED NOT DETECTED Final   Candida albicans NOT  DETECTED NOT DETECTED Final   Candida auris NOT DETECTED NOT DETECTED Final   Candida glabrata NOT DETECTED NOT DETECTED Final   Candida krusei NOT DETECTED NOT DETECTED Final   Candida parapsilosis NOT  DETECTED NOT DETECTED Final   Candida tropicalis NOT DETECTED NOT DETECTED Final   Cryptococcus neoformans/gattii NOT DETECTED NOT DETECTED Final   CTX-M ESBL NOT DETECTED NOT DETECTED Final   Carbapenem resistance IMP NOT DETECTED NOT DETECTED Final   Carbapenem resistance KPC NOT DETECTED NOT DETECTED Final   Carbapenem resistance NDM NOT DETECTED NOT DETECTED Final   Carbapenem resistance VIM NOT DETECTED NOT DETECTED Final    Comment: Performed at [facility], [address] [place], [place] [address]  Culture, blood (Routine x 2)     Status: None (Preliminary result)   Collection Time: 04/25/21  6:38 PM   Specimen: BLOOD  Result Value Ref Range Status   Specimen Description BLOOD BLOOD LEFT WRIST  Final   Special Requests   Final    BOTTLES DRAWN AEROBIC AND ANAEROBIC Blood Culture adequate volume   Culture  Setup Time   Final    GRAM VARIABLE COCCOBACILLI AEROBIC BOTTLE ONLY CRITICAL VALUE NOTED.  VALUE IS CONSISTENT WITH PREVIOUSLY REPORTED AND CALLED VALUE.    Culture   Final    GRAM VARIABLE COCCOBACILLI Performed at [facility], [address] [place], [place] [address]    Report Status PENDING  Incomplete  Resp Panel by RT-PCR (Flu A&B, Covid) Nasopharyngeal Swab     Status: None   Collection Time: 04/25/21  6:54 PM   Specimen: Nasopharyngeal Swab; Nasopharyngeal(NP) swabs in vial transport medium  Result Value Ref Range Status   SARS Coronavirus 2 by RT PCR NEGATIVE NEGATIVE Final    Comment: (NOTE) SARS-CoV-2 target nucleic acids are NOT DETECTED.  The SARS-CoV-2 RNA is generally detectable in upper respiratory specimens during the acute phase of infection. The lowest concentration of SARS-CoV-2 viral copies this assay can detect is 138 copies/mL. A negative result does not preclude SARS-Cov-2 infection and should not be used as the sole basis for treatment or other patient management decisions. A negative result may occur with  improper  specimen collection/handling, submission of specimen other than nasopharyngeal swab, presence of viral mutation(s) within the areas targeted by this assay, and inadequate number of viral copies(<138 copies/mL). A negative result must be combined with clinical observations, patient history, and epidemiological information. The expected result is Negative.  Fact Sheet for Patients:  EntrepreneurPulse.com.au  Fact Sheet for Healthcare Providers:  IncredibleEmployment.be  This test is no t yet approved or cleared by the [place] FDA and  has been authorized for detection and/or diagnosis of SARS-CoV-2 by FDA under an Emergency Use Authorization (EUA). This EUA will remain  in effect (meaning this test can be used) for the duration of the COVID-19 declaration under Section 564(b)(1) of the Act, 21 U.S.C.section 360bbb-3(b)(1), unless the authorization is terminated  or revoked sooner.       Influenza A by PCR NEGATIVE NEGATIVE Final   Influenza B by PCR NEGATIVE NEGATIVE Final    Comment: (NOTE) The Xpert Xpress SARS-CoV-2/FLU/RSV plus assay is intended as an aid in the diagnosis of influenza from Nasopharyngeal swab specimens and should not be used as a sole basis for treatment. Nasal washings and aspirates are unacceptable for Xpert Xpress SARS-CoV-2/FLU/RSV testing.  Fact Sheet for Patients: EntrepreneurPulse.com.au  Fact Sheet for Healthcare Providers: IncredibleEmployment.be  This test is not yet approved or  cleared by the [place] and has been authorized for detection and/or diagnosis of SARS-CoV-2 by FDA under an Emergency Use Authorization (EUA). This EUA will remain in effect (meaning this test can be used) for the duration of the COVID-19 declaration under Section 564(b)(1) of the Act, 21 U.S.C. section 360bbb-3(b)(1), unless the authorization is terminated or revoked.  Performed at  [facility], [address] [place], [place] [address]   MRSA Next Gen by PCR, Nasal     Status: None   Collection Time: 04/26/21  6:35 PM   Specimen: Nasal Mucosa; Nasal Swab  Result Value Ref Range Status   MRSA by PCR Next Gen NOT DETECTED NOT DETECTED Final    Comment: (NOTE) The GeneXpert MRSA Assay (FDA approved for NASAL specimens only), is one component of a comprehensive MRSA colonization surveillance program. It is not intended to diagnose MRSA infection nor to guide or monitor treatment for MRSA infections. Test performance is not FDA approved in patients less than 79 years old. Performed at [facility], [address], [place], [place] [address]     Anti-infectives:  Anti-infectives (From admission, onward)    Start     Dose/Rate Route Frequency Ordered Stop   04/26/21 2000  Ampicillin-Sulbactam (UNASYN) 3 g in sodium chloride 0.9 % 100 mL IVPB        3 g 200 mL/hr over 30 Minutes Intravenous Every 8 hours 04/26/21 1750     04/26/21 1800  eravacycline (XERAVA) 50 mg in sodium chloride 0.9 % 150 mL IVPB        50 mg 150 mL/hr over 60 Minutes Intravenous Every 12 hours 04/26/21 1515     04/26/21 1200  Ampicillin-Sulbactam (UNASYN) 3 g in sodium chloride 0.9 % 100 mL IVPB  Status:  Discontinued        3 g 200 mL/hr over 30 Minutes Intravenous Every 12 hours 04/26/21 1109 04/26/21 1750   04/25/21 2300  piperacillin-tazobactam (ZOSYN) IVPB 2.25 g  Status:  Discontinued        2.25 g 100 mL/hr over 30 Minutes Intravenous Every 8 hours 04/25/21 2156 04/26/21 1109   04/25/21 2200  vancomycin (VANCOREADY) IVPB 1250 mg/250 mL        1,250 mg 166.7 mL/hr over 90 Minutes Intravenous  Once 04/25/21 2151 04/26/21 0155   04/25/21 2157  vancomycin (VANCOREADY) IVPB 500 mg/100 mL  Status:  Discontinued        500 mg 100 mL/hr over 60 Minutes Intravenous Every Dialysis 04/25/21 2158 04/26/21 1112   04/25/21 2100  cefTRIAXone (ROCEPHIN) 1 g in sodium chloride  0.9 % 100 mL IVPB        1 g 200 mL/hr over 30 Minutes Intravenous  Once 04/25/21 2059 04/25/21 2155   04/25/21 2100  azithromycin (ZITHROMAX) 500 mg in sodium chloride 0.9 % 250 mL IVPB        500 mg 250 mL/hr over 60 Minutes Intravenous  Once 04/25/21 2059 04/25/21 2256          PAST MEDICAL HISTORY   Past Medical History:  Diagnosis Date   Anemia    Anginal pain (HCC)    Anxiety    Arthritis    Asthma    Broken wrist    Bronchitis    chronic diastolic CHF 9/92/4268   Chronic kidney disease    COPD (chronic obstructive pulmonary disease) ([place])    Coronary artery disease    a. cath 2013: stenting to RCA (report  not available); b. cath 2014: LM nl, pLAD 40%, mLAD nl, ost LCx 40%, mid LCx nl, pRCA 30% @ site of prior stent, mRCA 50%   Depression    Diabetes mellitus ([place])    Diabetes mellitus without complication ([place])    Diabetic neuropathy ([place])    dialysis 2006   Diverticulosis    Dizziness    Dyspnea    Elevated lipids    Environmental and seasonal allergies    ESRD (end stage renal disease) on dialysis ([place])    M-W-F   Gastroparesis    GERD (gastroesophageal reflux disease)    Headache    History of anemia due to chronic kidney disease    History of hiatal hernia    HOH (hard of hearing)    Hx of pancreatitis 2015   Hypertension    Lower extremity edema    Mitral regurgitation    a. echo 10/2013: EF 62%, noWMA, mildly dilated LA, mild to mod MR/TR, GR1DD   Myocardial infarction ([place])    Orthopnea    Parathyroid abnormality ([place])    Peripheral arterial disease ([place])    Pneumonia    Renal cancer ([place])    Renal insufficiency    Pt is on dialysis on M,W + F.   Wheezing      SURGICAL HISTORY   Past Surgical History:  Procedure Laterality Date   A/V SHUNTOGRAM Left 01/20/2018   Procedure: A/V SHUNTOGRAM;  Surgeon: Algernon Huxley, MD;  Location: [place] [facility];  Service: Cardiovascular;  Laterality: Left;   ABDOMINAL HYSTERECTOMY  1992   AMPUTATION  TOE Left 10/02/2017   Procedure: AMPUTATION TOE-LEFT GREAT TOE;  Surgeon: Albertine Patricia, DPM;  Location: [facility];  Service: Podiatry;  Laterality: Left;   APPENDECTOMY     APPLICATION OF WOUND VAC N/A 11/25/2019   Procedure: APPLICATION OF WOUND VAC;  Surgeon: Katha Cabal, MD;  Location: [facility];  Service: Vascular;  Laterality: N/A;   ARTERY BIOPSY Right 10/11/2018   Procedure: BIOPSY TEMPORAL ARTERY;  Surgeon: Vickie Epley, MD;  Location: [facility];  Service: General;  Laterality: Right;   CARDIAC CATHETERIZATION Left 07/26/2015   Procedure: Left Heart Cath and Coronary Angiography;  Surgeon: Dionisio David, MD;  Location: [place] [facility];  Service: Cardiovascular;  Laterality: Left;   CATARACT EXTRACTION W/ INTRAOCULAR LENS IMPLANT Right    CATARACT EXTRACTION W/PHACO Left 03/10/2017   Procedure: CATARACT EXTRACTION PHACO AND INTRAOCULAR LENS PLACEMENT ([place]);  Surgeon: Birder Robson, MD;  Location: [facility];  Service: Ophthalmology;  Laterality: Left;  [place] 00:51.9 AP% 14.2 CDE 7.39 fluid pack lot # [id number]   CENTRAL LINE INSERTION Right 11/11/2019   Procedure: CENTRAL LINE INSERTION;  Surgeon: Katha Cabal, MD;  Location: [facility];  Service: Vascular;  Laterality: Right;   CENTRAL LINE INSERTION  11/25/2019   Procedure: CENTRAL LINE INSERTION;  Surgeon: Katha Cabal, MD;  Location: [facility];  Service: Vascular;;   CHOLECYSTECTOMY     COLONOSCOPY WITH PROPOFOL N/A 08/12/2016   Procedure: COLONOSCOPY WITH PROPOFOL;  Surgeon: Lollie Sails, MD;  Location: [facility] ENDOSCOPY;  Service: Endoscopy;  Laterality: N/A;   DIALYSIS FISTULA CREATION Left    upper arm   dialysis grafts     DIALYSIS/PERMA CATHETER INSERTION N/A 11/14/2019   Procedure: DIALYSIS/PERMA CATHETER INSERTION;  Surgeon: Algernon Huxley, MD;  Location: [place] [facility];  Service: Cardiovascular;  Laterality: N/A;   DIALYSIS/PERMA CATHETER INSERTION N/A 02/03/2020   Procedure: DIALYSIS/PERMA  CATHETER INSERTION;  Surgeon: Katha Cabal, MD;  Location: [place] [facility];  Service: Cardiovascular;  Laterality: N/A;   DIALYSIS/PERMA CATHETER INSERTION N/A 06/19/2020   Procedure: DIALYSIS/PERMA CATHETER INSERTION;  Surgeon: Katha Cabal, MD;  Location: [place] [facility];  Service: Cardiovascular;  Laterality: N/A;   DIALYSIS/PERMA CATHETER REMOVAL N/A 05/25/2020   Procedure: DIALYSIS/PERMA CATHETER REMOVAL;  Surgeon: Katha Cabal, MD;  Location: [place] [facility];  Service: Cardiovascular;  Laterality: N/A;   ESOPHAGOGASTRODUODENOSCOPY N/A 03/08/2015   Procedure: ESOPHAGOGASTRODUODENOSCOPY (EGD);  Surgeon: Manya Silvas, MD;  Location: [facility] ENDOSCOPY;  Service: Endoscopy;  Laterality: N/A;   ESOPHAGOGASTRODUODENOSCOPY (EGD) WITH PROPOFOL N/A 03/18/2016   Procedure: ESOPHAGOGASTRODUODENOSCOPY (EGD) WITH PROPOFOL;  Surgeon: [name], MD;  Location: [facility];  Service: Endoscopy;  Laterality: N/A;   EYE SURGERY Right 2018   FECAL TRANSPLANT N/A 08/23/2015   Procedure: FECAL TRANSPLANT;  Surgeon: Manya Silvas, MD;  Location: [facility] ENDOSCOPY;  Service: Endoscopy;  Laterality: N/A;   HAND SURGERY Bilateral    HEMATOMA EVACUATION Left 11/25/2019   Procedure: EVACUATION HEMATOMA;  Surgeon: Katha Cabal, MD;  Location: [facility];  Service: Vascular;  Laterality: Left;   I & D EXTREMITY Left 11/25/2019   Procedure: IRRIGATION AND DEBRIDEMENT EXTREMITY;  Surgeon: Katha Cabal, MD;  Location: [facility];  Service: Vascular;  Laterality: Left;   IR FLUORO GUIDE CV LINE RIGHT  04/06/2020   IR INJECT/THERA/INC NEEDLE/CATH/PLC EPI/CERV/THOR W/IMG  08/13/2020   IR RADIOLOGIST EVAL & MGMT  07/28/2019   IR RADIOLOGIST EVAL & MGMT  08/11/2019   LIGATION OF ARTERIOVENOUS  FISTULA Left 11/11/2019   LIGATION OF ARTERIOVENOUS  FISTULA Left 11/11/2019   Procedure: LIGATION OF ARTERIOVENOUS  FISTULA;  Surgeon: Katha Cabal, MD;  Location: [facility];  Service: Vascular;   Laterality: Left;   LIGATIONS OF HERO GRAFT Right 06/13/2020   Procedure: LIGATION / REMOVAL OF RIGHT HERO GRAFT;  Surgeon: Katha Cabal, MD;  Location: [facility];  Service: Vascular;  Laterality: Right;   PERIPHERAL VASCULAR CATHETERIZATION N/A 12/20/2015   Procedure: Thrombectomy of dialysis access versus permcath placement;  Surgeon: Algernon Huxley, MD;  Location: [place] [facility];  Service: Cardiovascular;  Laterality: N/A;   PERIPHERAL VASCULAR CATHETERIZATION N/A 12/20/2015   Procedure: A/V Shunt Intervention;  Surgeon: Algernon Huxley, MD;  Location: [place] [facility];  Service: Cardiovascular;  Laterality: N/A;   PERIPHERAL VASCULAR CATHETERIZATION N/A 12/20/2015   Procedure: A/V Shuntogram/Fistulagram;  Surgeon: Algernon Huxley, MD;  Location: [place] [facility];  Service: Cardiovascular;  Laterality: N/A;   PERIPHERAL VASCULAR CATHETERIZATION N/A 01/02/2016   Procedure: A/V Shuntogram/Fistulagram;  Surgeon: Algernon Huxley, MD;  Location: [place] [facility];  Service: Cardiovascular;  Laterality: N/A;   PERIPHERAL VASCULAR CATHETERIZATION N/A 01/02/2016   Procedure: A/V Shunt Intervention;  Surgeon: Algernon Huxley, MD;  Location: [place] [facility];  Service: Cardiovascular;  Laterality: N/A;   TEE WITHOUT CARDIOVERSION N/A 06/11/2020   Procedure: TRANSESOPHAGEAL ECHOCARDIOGRAM (TEE);  Surgeon: Teodoro Spray, MD;  Location: [facility];  Service: Cardiovascular;  Laterality: N/A;   UPPER EXTREMITY ANGIOGRAPHY Bilateral 11/27/2020   Procedure: UPPER EXTREMITY ANGIOGRAPHY;  Surgeon: Katha Cabal, MD;  Location: [place] [facility];  Service: Cardiovascular;  Laterality: Bilateral;   UPPER EXTREMITY VENOGRAPHY Right 01/18/2020   Procedure: UPPER EXTREMITY VENOGRAPHY;  Surgeon: Katha Cabal, MD;  Location: [place] [facility];  Service: Cardiovascular;  Laterality: Right;   UPPER  EXTREMITY VENOGRAPHY Bilateral 11/27/2020   Procedure: UPPER EXTREMITY VENOGRAPHY;  Surgeon: Katha Cabal, MD;  Location: [place] [facility];  Service: Cardiovascular;  Laterality: Bilateral;   VASCULAR ACCESS DEVICE INSERTION Right 04/13/2020   Procedure: INSERTION OF HERO VASCULAR ACCESS DEVICE (GRAFT);  Surgeon: Katha Cabal, MD;  Location: [facility];  Service: Vascular;  Laterality: Right;     FAMILY HISTORY   Family History  Problem Relation Age of Onset   Kidney disease Mother    Diabetes Mother    Cancer Father    Kidney disease Sister      SOCIAL HISTORY   Social History   Tobacco Use   Smoking status: Never   Smokeless tobacco: Never  Vaping Use   Vaping Use: Never used  Substance Use Topics   Alcohol use: Not Currently    Comment: glass wine week per pt   Drug use: Yes    Types: Marijuana    Comment: once a day     MEDICATIONS   Current Medication:  Current Facility-Administered Medications:    acetaminophen (TYLENOL) tablet 650 mg, 650 mg, Per Tube, Q6H PRN **OR** acetaminophen (TYLENOL) suppository 650 mg, 650 mg, Rectal, Q6H PRN, Rust-Chester, Britton L, NP   albumin human 25 % solution 25 g, 25 g, Intravenous, q12n4p, Kolluru, Sarath, MD, Stopped at 04/27/21 0954   albuterol (PROVENTIL) (2.5 MG/3ML) 0.083% nebulizer solution 2.5 mg, 2.5 mg, Inhalation, Q4H PRN, Howerter, Justin B, DO   Ampicillin-Sulbactam (UNASYN) 3 g in sodium chloride 0.9 % 100 mL IVPB, 3 g, Intravenous, Q8H, Rodriguez-Guzman, Raquel, RPH-CPP, Stopped at 04/27/21 0437   atorvastatin (LIPITOR) tablet 20 mg, 20 mg, Per Tube, QPM, Howerter, Justin B, DO   chlorhexidine gluconate (MEDLINE KIT) (PERIDEX) 0.12 % solution 15 mL, 15 mL, Mouth Rinse, BID, Rust-Chester, Britton L, NP, 15 mL at 04/27/21 0737   Chlorhexidine Gluconate Cloth 2 % PADS 6 each, 6 each, Topical, Q0600, Kolluru, Sarath, MD, 6 each at 04/27/21 0535   cholecalciferol (VITAMIN D3) tablet 1,000 Units, 1,000 Units, Per Tube, Daily, Howerter, Justin B, DO, 1,000 Units at 04/27/21 0942   cholestyramine light (PREVALITE)  packet 4 g, 4 g, Oral, BID, Howerter, Justin B, DO, 4 g at 04/26/21 2328   dextrose 10 % and 0.45 % NaCl infusion, , Intravenous, Continuous, Graves, Dana E, NP, Last Rate: 85 mL/hr at 04/27/21 1000, Infusion Verify at 04/27/21 1000   dextrose 50 % solution 50 mL, 1 ampule, Intravenous, Q1H PRN, Howerter, Justin B, DO, 50 mL at 04/26/21 1830   docusate (COLACE) 50 MG/5ML liquid 100 mg, 100 mg, Per Tube, BID, Rust-Chester, Toribio Harbour L, NP   docusate sodium (COLACE) capsule 100 mg, 100 mg, Oral, BID PRN, Ottie Glazier, MD   eravacycline (XERAVA) 50 mg in sodium chloride 0.9 % 150 mL IVPB, 50 mg, Intravenous, Q12H, Ravishankar, Jayashree, MD, Last Rate: 150 mL/hr at 04/27/21 1005, 50 mg at 04/27/21 1005   fentaNYL (SUBLIMAZE) injection 25-50 mcg, 25-50 mcg, Intravenous, Q2H PRN, Rust-Chester, Britton L, NP   fentaNYL 2560mcg in NS 260mL (46mcg/ml) infusion-PREMIX, 0-400 mcg/hr, Intravenous, Continuous, Sheila Ocasio, MD, Last Rate: 5 mL/hr at 04/27/21 1000, 50 mcg/hr at 04/27/21 1000   ferrous sulfate tablet 325 mg, 325 mg, Oral, Daily, Howerter, Justin B, DO   heparin injection 1,000-6,000 Units, 1,000-6,000 Units, CRRT, PRN, Kolluru, Sarath, MD   heparin injection 5,000 Units, 5,000 Units, Subcutaneous, Q8H, Verdine Grenfell, MD, 5,000 Units at 04/27/21 0535   lactated ringers infusion, , Intravenous,  Continuous PRN, Ottie Glazier, MD, 1,000 mL at 04/26/21 1446   lipase/protease/amylase (CREON) capsule 36,000 Units, 36,000 Units, Oral, TID AC, Howerter, Justin B, DO, 36,000 Units at 04/26/21 0820   MEDLINE mouth rinse, 15 mL, Mouth Rinse, 10 times per day, Rust-Chester, Britton L, NP, 15 mL at 04/27/21 0930   megestrol (MEGACE) 400 MG/10ML suspension 400 mg, 400 mg, Per Tube, Daily, Howerter, Justin B, DO, 400 mg at 04/27/21 0943   midazolam (VERSED) injection 1 mg, 1 mg, Intravenous, Q2H PRN, Rust-Chester, Toribio Harbour L, NP   midazolam (VERSED) injection 1 mg, 1 mg, Intravenous, Q15 min PRN,  Rust-Chester, Toribio Harbour L, NP, 1 mg at 04/27/21 0945   midodrine (PROAMATINE) tablet 10 mg, 10 mg, Per Tube, TID WC, Howerter, Justin B, DO, 10 mg at 04/27/21 7026   norepinephrine (LEVOPHED) 16 mg in 28m premix infusion, 0-40 mcg/min, Intravenous, Titrated, Graves, DRaeford Razor NP, Stopped at 04/27/21 0413   polyethylene glycol (MIRALAX / GLYCOLAX) packet 17 g, 17 g, Oral, Daily PRN, AOttie Glazier MD   polyethylene glycol (MIRALAX / GLYCOLAX) packet 17 g, 17 g, Per Tube, Daily, Rust-Chester, BHuel Cote NP   prismasol BGK 2/2.5 dialysis solution, , CRRT, Continuous, Kolluru, Sarath, MD   prismasol BGK 2/2.5 replacement solution, , CRRT, Continuous, Kolluru, Sarath, MD   prismasol BGK 2/2.5 replacement solution, , CRRT, Continuous, Kolluru, Sarath, MD   rocuronium (ZEMURON) injection, , Intravenous, PRN, AOttie Glazier MD, 40 mg at 04/26/21 1440   sevelamer carbonate (RENVELA) tablet 800 mg, 800 mg, Per Tube, TID WC & HS, Howerter, Justin B, DO, 800 mg at 04/27/21 0737   sodium chloride 0.9 % primer fluid for CRRT, , CRRT, PRN, Kolluru, Sarath, MD   vasopressin (PITRESSIN) 20 Units in sodium chloride 0.9 % 100 mL infusion-*FOR SHOCK*, 0-0.03 Units/min, Intravenous, Continuous, KVal Riles MD, Last Rate: 6 mL/hr at 04/27/21 1000, 0.02 Units/min at 04/27/21 1000   vitamin B-12 (CYANOCOBALAMIN) tablet 1,000 mcg, 1,000 mcg, Per Tube, Daily, Howerter, Justin B, DO, 1,000 mcg at 04/27/21 0944    ALLERGIES   Ace inhibitors, Ativan [lorazepam], Compazine [prochlorperazine edisylate], Sumatriptan succinate, Zofran [ondansetron], Losartan, Prochlorperazine, Reglan [metoclopramide], Scopolamine, Tape, Tapentadol, and Ultrasound gel    REVIEW OF SYSTEMS   Unable to obtain ROS due to unresponsive state  PHYSICAL EXAMINATION   Vital Signs: Temp:  [94.28 F (34.6 C)-97.9 F (36.6 C)] 95.54 F (35.3 C) (08/06 0954) Pulse Rate:  [43-130] 71 (08/06 0954) Resp:  [13-23] 15 (08/06 0954) BP:  (50-141)/(21-66) 127/53 (08/06 0954) SpO2:  [90 %-100 %] 100 % (08/06 0954) FiO2 (%):  [40 %-80 %] 40 % (08/06 0841) Weight:  [82.2 kg] 82.2 kg (08/06 0418)  GENERAL:chronically ill apprearing HEAD: Normocephalic, atraumatic. +alopecia EYES: Pupils equal, round, reactive to light.  No scleral icterus. +periorbital edema MOUTH: Moist mucosal membrane. NECK: Supple. No thyromegaly. No nodules. No JVD.  PULMONARY: decreased air entry bilatearlly  CARDIOVASCULAR: S1 and S2. Regular rate and rhythm. No murmurs, rubs, or gallops.  GASTROINTESTINAL: Soft, nontender, non-distended. No masses. Positive bowel sounds. No hepatosplenomegaly.  MUSCULOSKELETAL: No swelling, clubbing, or edema.  NEUROLOGIC: GCS4T SKIN:intact,warm,dry   PERTINENT DATA     Infusions:  albumin human Stopped (04/27/21 0954)   ampicillin-sulbactam (UNASYN) IV Stopped (04/27/21 0437)   dextrose 10 % and 0.45 % NaCl 85 mL/hr at 04/27/21 1000   eravacycline 50 mg (04/27/21 1005)   fentaNYL infusion INTRAVENOUS 50 mcg/hr (04/27/21 1000)   lactated ringers     norepinephrine (LEVOPHED) Adult infusion  Stopped (04/27/21 0413)   prismasol BGK 2/2.5 dialysis solution     prismasol BGK 2/2.5 replacement solution     prismasol BGK 2/2.5 replacement solution     vasopressin 0.02 Units/min (04/27/21 1000)   Scheduled Medications:  atorvastatin  20 mg Per Tube QPM   chlorhexidine gluconate (MEDLINE KIT)  15 mL Mouth Rinse BID   Chlorhexidine Gluconate Cloth  6 each Topical Q0600   cholecalciferol  1,000 Units Per Tube Daily   cholestyramine light  4 g Oral BID   docusate  100 mg Per Tube BID   ferrous sulfate  325 mg Oral Daily   heparin  5,000 Units Subcutaneous Q8H   lipase/protease/amylase  36,000 Units Oral TID AC   mouth rinse  15 mL Mouth Rinse 10 times per day   megestrol  400 mg Per Tube Daily   midodrine  10 mg Per Tube TID WC   polyethylene glycol  17 g Per Tube Daily   sevelamer carbonate  800 mg Per Tube  TID WC & HS   cyanocobalamin  1,000 mcg Per Tube Daily   PRN Medications: acetaminophen **OR** acetaminophen, albuterol, dextrose, docusate sodium, fentaNYL (SUBLIMAZE) injection, heparin, lactated ringers, midazolam, midazolam, polyethylene glycol, rocuronium, sodium chloride Hemodynamic parameters:   Intake/Output: 08/05 0701 - 08/06 0700 In: 3296.1 [I.V.:2406; NG/GT:90; IV Piggyback:800.1] Out: 1425   Ventilator  Settings: Vent Mode: PRVC FiO2 (%):  [40 %-80 %] 40 % Set Rate:  [2 bmp-20 bmp] 20 bmp Vt Set:  [450 mL] 450 mL PEEP:  [5 cmH20] 5 cmH20 Plateau Pressure:  [20 cmH20] 20 cmH20   LAB RESULTS:  Basic Metabolic Panel: Recent Labs  Lab 04/25/21 1651 04/25/21 1838 04/26/21 0415 04/26/21 1932 04/27/21 0417  NA 133*  --  131*  --  131*  K 4.9  --  5.0  --  4.0  CL 100  --  100  --  100  CO2 24  --  24  --  26  GLUCOSE 46*  --  103*  --  119*  BUN 14  --  18  --  14  CREATININE 3.63*  --  4.04*  --  2.68*  CALCIUM 8.0*  --  7.0*  --  8.1*  MG 1.7 1.7 1.6* 1.6*  --   PHOS  --   --  3.9  --  2.5    Liver Function Tests: Recent Labs  Lab 04/25/21 1651 04/26/21 0415 04/27/21 0417  AST 23 23  --   ALT 14 11  --   ALKPHOS 137* 100  --   BILITOT 0.5 0.8  --   PROT 7.6 5.5*  --   ALBUMIN 2.3* 1.6* 2.3*    Recent Labs  Lab 04/25/21 1651  LIPASE 20    Recent Labs  Lab 04/26/21 0415  AMMONIA 55*    CBC: Recent Labs  Lab 04/25/21 1651 04/26/21 0415 04/27/21 0417  WBC 5.7 4.1 11.3*  NEUTROABS 4.5 3.4  --   HGB 9.1* 8.1* 7.7*  HCT 28.8* 25.5* 24.1*  MCV 89.7 91.1 88.3  PLT 245 188 149*    Cardiac Enzymes: No results for input(s): CKTOTAL, CKMB, CKMBINDEX, TROPONINI in the last 168 hours. BNP: Invalid input(s): POCBNP CBG: Recent Labs  Lab 04/26/21 2125 04/26/21 2227 04/27/21 0016 04/27/21 0416 04/27/21 0810  GLUCAP 100* 94 106* 111* 92        IMAGING RESULTS:  Imaging: CT ABDOMEN PELVIS WO CONTRAST  Result Date:  04/25/2021 CLINICAL DATA:  Diffuse abdominal pain EXAM: CT ABDOMEN AND PELVIS WITHOUT CONTRAST TECHNIQUE: Multidetector CT imaging of the abdomen and pelvis was performed following the standard protocol without IV contrast. COMPARISON:  CT 06/16/2020 FINDINGS: Lower chest: Lung bases demonstrate small bilateral pleural effusions and cardiomegaly. Hazy lung densities at the bases likely due to edema. Atelectasis at both bases. Hepatobiliary: Status post cholecystectomy. No focal hepatic abnormality. No biliary dilatation. Pancreas: Unremarkable. No pancreatic ductal dilatation or surrounding inflammatory changes. Spleen: Normal in size without focal abnormality. Adrenals/Urinary Tract: Adrenal glands are normal. Atrophic native kidneys without hydronephrosis. Stable cyst in the mid left kidney. The bladder is nearly empty Stomach/Bowel: The stomach is nonenlarged. No dilated small bowel. No acute bowel wall thickening. Diverticular disease of left colon without acute wall thickening Vascular/Lymphatic: Extensive vascular calcification. Mild retroperitoneal lymph nodes measuring up to 12 mm Reproductive: Status post hysterectomy. No adnexal masses. Other: Negative for free air. Small free fluid within the abdomen and pelvis. Extensive edema within the subcutaneous soft tissues. Musculoskeletal: No acute osseous abnormality IMPRESSION: 1. Cardiomegaly with small bilateral pleural effusions and hazy lung densities likely due to edema. Small amount of abdominopelvic ascites. Extensive edema within the subcutaneous soft tissues consistent with anasarca. 2. Native atrophic kidneys without hydronephrosis 3. Negative for bowel obstruction or bowel wall thickening. Left colon diverticular disease without acute wall thickening Electronically Signed   By: Donavan Foil M.D.   On: 04/25/2021 20:38   DG Chest 2 View  Result Date: 04/25/2021 CLINICAL DATA:  Suspected sepsis. EXAM: CHEST - 2 VIEW COMPARISON:  Chest x-ray  03/29/2021, CT chest 08/10/2020 FINDINGS: Left dialysis catheter with tips overlying the right atrium. Left subclavian vascular stent again noted in similar position. Slightly more prominent cardiac silhouette which may be due to technique in a patient with an AP sitting radiograph. Otherwise the heart size and mediastinal contours are unchanged. No definite pericardial effusion on lateral view with limited evaluation. Question coronary stent. Bibasilar patchy airspace opacities including of the lingula. Nodule like density on lateral view. Coarsened interstitial markings with no overt pulmonary edema. Persistent bilateral trace to small volume pleural effusions. No pneumothorax. No acute osseous abnormality. IMPRESSION: 1. Persistent bilateral trace to small volume pleural effusions with associated patchy airspace opacities of bilateral lobe lower lobes and lingula likely representing infection/inflammation. Nodular-like density on lateral view in the lower lobe. Followup PA and lateral chest X-ray is recommended in 3-4 weeks following therapy to ensure resolution and exclude underlying malignancy. 2. Cardiomegaly with slightly more prominent cardiac silhouette which may be due to technique in a patient with an AP sitting radiograph. Electronically Signed   By: Iven Finn M.D.   On: 04/25/2021 17:58   DG Abd 1 View  Result Date: 04/26/2021 CLINICAL DATA:  Check gastric catheter placement EXAM: ABDOMEN - 1 VIEW COMPARISON:  None. FINDINGS: Scattered large and small bowel gas is noted. Contrast material is noted within the colon. Gastric catheter is noted extending into the stomach. Changes of prior cholecystectomy are seen. IMPRESSION: Gastric catheter within the stomach. Electronically Signed   By: Inez Catalina M.D.   On: 04/26/2021 19:03   DG Chest Port 1 View  Result Date: 04/26/2021 CLINICAL DATA:  Check endotracheal tube placement EXAM: PORTABLE CHEST 1 VIEW COMPARISON:  04/25/2021 FINDINGS: Cardiac  shadow is enlarged but stable. Dialysis catheter is again noted on the left as well as prior vascular stenting on the left. Endotracheal tube is noted in satisfactory position. Gastric catheter is noted extending into the  stomach. Persistent bibasilar densities are noted left greater than right with associated effusions. The overall appearance is stable from the prior exam. No bony abnormality is noted. IMPRESSION: Tubes and lines in satisfactory position. Persistent bibasilar opacity left greater than right. Electronically Signed   By: Inez Catalina M.D.   On: 04/26/2021 19:05   _0 @ DG Abd 1 View  Result Date: 04/26/2021 CLINICAL DATA:  Check gastric catheter placement EXAM: ABDOMEN - 1 VIEW COMPARISON:  None. FINDINGS: Scattered large and small bowel gas is noted. Contrast material is noted within the colon. Gastric catheter is noted extending into the stomach. Changes of prior cholecystectomy are seen. IMPRESSION: Gastric catheter within the stomach. Electronically Signed   By: Inez Catalina M.D.   On: 04/26/2021 19:03   DG Chest Port 1 View  Result Date: 04/26/2021 CLINICAL DATA:  Check endotracheal tube placement EXAM: PORTABLE CHEST 1 VIEW COMPARISON:  04/25/2021 FINDINGS: Cardiac shadow is enlarged but stable. Dialysis catheter is again noted on the left as well as prior vascular stenting on the left. Endotracheal tube is noted in satisfactory position. Gastric catheter is noted extending into the stomach. Persistent bibasilar densities are noted left greater than right with associated effusions. The overall appearance is stable from the prior exam. No bony abnormality is noted. IMPRESSION: Tubes and lines in satisfactory position. Persistent bibasilar opacity left greater than right. Electronically Signed   By: Inez Catalina M.D.   On: 04/26/2021 19:05     ASSESSMENT AND PLAN    -Multidisciplinary rounds held today  Septic shock -present on admission - due to bacteremia with acinitebacter  on blood cultures -ID on case appreciate input- Unasyn -use vasopressors to keep MAP>65 -follow ABG and LA -follow up cultures -emperic ABX -consider stress dose steroids   Chronic diastolic CHF    - repeat TTE due to bacteremia -oxygen as needed -Lasix as tolerated -follow up cardiac enzymes as indicated ICU monitoring  Renal Failure-esrd on HD -follow chem 7 -follow UO -continue Foley Catheter-assess need daily -pharmD for electrolytes -nephrology for RRT   ID -continue IV abx as prescibed -follow up cultures  GI/Nutrition GI PROPHYLAXIS as indicated DIET-->TF's as tolerated Constipation protocol as indicated  ENDO - ICU hypoglycemic\Hyperglycemia protocol -check FSBS per protocol   ELECTROLYTES -follow labs as needed -replace as needed -pharmacy consultation   DVT/GI PRX ordered -SCDs  TRANSFUSIONS AS NEEDED MONITOR FSBS ASSESS the need for LABS as needed   Critical care provider statement:   Total critical care time: 33 minutes   Performed by: Lanney Gins MD   Critical care time was exclusive of separately billable procedures and treating other patients.   Critical care was necessary to treat or prevent imminent or life-threatening deterioration.   Critical care was time spent personally by me on the following activities: development of treatment plan with patient and/or surrogate as well as nursing, discussions with consultants, evaluation of patient's response to treatment, examination of patient, obtaining history from patient or surrogate, ordering and performing treatments and interventions, ordering and review of laboratory studies, ordering and review of radiographic studies, pulse oximetry and re-evaluation of patient's condition.      This document was prepared using Dragon voice recognition software and may include unintentional dictation errors.    Ottie Glazier, M.D.  Division of [facility]

## 2021-04-27 NOTE — Progress Notes (Signed)
Alerted by nursing to bleeding wounds on bilateral feet.    - WOC consult ordered - mepilex & foam to cover for now   Domingo Pulse [place], AGACNP-BC Acute Care Nurse Practitioner [place]   [phone] / [phone] Please see Amion for pager details.

## 2021-04-28 ENCOUNTER — Inpatient Hospital Stay
Admit: 2021-04-28 | Discharge: 2021-04-28 | Disposition: A | Discharge status: Home / Self Care | Payer: Medicare Other | Attending: Pulmonary Disease | Admitting: Pulmonary Disease

## 2021-04-28 ENCOUNTER — Inpatient Hospital Stay: Payer: Medicare Other

## 2021-04-28 DIAGNOSIS — R6520 Severe sepsis without septic shock: Secondary | ICD-10-CM | POA: Diagnosis not present

## 2021-04-28 DIAGNOSIS — A419 Sepsis, unspecified organism: Secondary | ICD-10-CM | POA: Diagnosis not present

## 2021-04-28 DIAGNOSIS — R7881 Bacteremia: Secondary | ICD-10-CM | POA: Diagnosis not present

## 2021-04-28 LAB — ECHOCARDIOGRAM COMPLETE
AR max vel: 1.52 cm2
AV Area VTI: 1.57 cm2
AV Area mean vel: 1.59 cm2
AV Mean grad: 5 mmHg
AV Peak grad: 9.9 mmHg
Ao pk vel: 1.57 m/s
Area-P 1/2: 3.23 cm2
Height: 65 in
MV VTI: 1.51 cm2
S' Lateral: 2.9 cm
Weight: 2938.29 oz

## 2021-04-28 LAB — CALCIUM, IONIZED: Calcium, Ionized, Serum: 4.3 mg/dL — ABNORMAL LOW (ref 4.5–5.6)

## 2021-04-28 LAB — CBC
HCT: 21.4 % — ABNORMAL LOW (ref 36.0–46.0)
Hemoglobin: 7 g/dL — ABNORMAL LOW (ref 12.0–15.0)
MCH: 28.6 pg (ref 26.0–34.0)
MCHC: 32.7 g/dL (ref 30.0–36.0)
MCV: 87.3 fL (ref 80.0–100.0)
Platelets: 140 10*3/uL — ABNORMAL LOW (ref 150–400)
RBC: 2.45 MIL/uL — ABNORMAL LOW (ref 3.87–5.11)
RDW: 21.7 % — ABNORMAL HIGH (ref 11.5–15.5)
WBC: 10.2 10*3/uL (ref 4.0–10.5)
nRBC: 0 % (ref 0.0–0.2)

## 2021-04-28 LAB — RENAL FUNCTION PANEL
Albumin: 2.3 g/dL — ABNORMAL LOW (ref 3.5–5.0)
Albumin: 2.9 g/dL — ABNORMAL LOW (ref 3.5–5.0)
Anion gap: 5 (ref 5–15)
Anion gap: 5 (ref 5–15)
BUN: 6 mg/dL — ABNORMAL LOW (ref 8–23)
BUN: 6 mg/dL — ABNORMAL LOW (ref 8–23)
CO2: 27 mmol/L (ref 22–32)
CO2: 27 mmol/L (ref 22–32)
Calcium: 8.5 mg/dL — ABNORMAL LOW (ref 8.9–10.3)
Calcium: 8.8 mg/dL — ABNORMAL LOW (ref 8.9–10.3)
Chloride: 101 mmol/L (ref 98–111)
Chloride: 101 mmol/L (ref 98–111)
Creatinine, Ser: 1.27 mg/dL — ABNORMAL HIGH (ref 0.44–1.00)
Creatinine, Ser: 1.15 mg/dL — ABNORMAL HIGH (ref 0.44–1.00)
GFR, Estimated: 48 mL/min — ABNORMAL LOW (ref >60)
GFR, Estimated: 54 mL/min — ABNORMAL LOW (ref >60)
Glucose, Bld: 114 mg/dL — ABNORMAL HIGH (ref 70–99)
Glucose, Bld: 155 mg/dL — ABNORMAL HIGH (ref 70–99)
Phosphorus: 1.5 mg/dL — ABNORMAL LOW (ref 2.5–4.6)
Phosphorus: 2.1 mg/dL — ABNORMAL LOW (ref 2.5–4.6)
Potassium: 2.8 mmol/L — ABNORMAL LOW (ref 3.5–5.1)
Potassium: 3.1 mmol/L — ABNORMAL LOW (ref 3.5–5.1)
Sodium: 133 mmol/L — ABNORMAL LOW (ref 135–145)
Sodium: 133 mmol/L — ABNORMAL LOW (ref 135–145)

## 2021-04-28 LAB — GLUCOSE, CAPILLARY
Glucose-Capillary: 113 mg/dL — ABNORMAL HIGH (ref 70–99)
Glucose-Capillary: 114 mg/dL — ABNORMAL HIGH (ref 70–99)
Glucose-Capillary: 121 mg/dL — ABNORMAL HIGH (ref 70–99)
Glucose-Capillary: 132 mg/dL — ABNORMAL HIGH (ref 70–99)
Glucose-Capillary: 149 mg/dL — ABNORMAL HIGH (ref 70–99)
Glucose-Capillary: 156 mg/dL — ABNORMAL HIGH (ref 70–99)
Glucose-Capillary: 168 mg/dL — ABNORMAL HIGH (ref 70–99)

## 2021-04-28 LAB — CULTURE, BLOOD (ROUTINE X 2)
Special Requests: ADEQUATE
Special Requests: ADEQUATE

## 2021-04-28 LAB — HEMOGLOBIN AND HEMATOCRIT, BLOOD
HCT: 23.7 % — ABNORMAL LOW (ref 36.0–46.0)
Hemoglobin: 7.5 g/dL — ABNORMAL LOW (ref 12.0–15.0)

## 2021-04-28 LAB — MAGNESIUM: Magnesium: 1.7 mg/dL (ref 1.7–2.4)

## 2021-04-28 MED ORDER — VITAMIN B-12 1000 MCG PO TABS: 1000.0000 ug | ORAL_TABLET | Freq: Every day | ORAL | Status: DC

## 2021-04-28 MED ORDER — POLYETHYLENE GLYCOL 3350 17 G PO PACK: 17.0000 g | PACK | Freq: Every day | ORAL | Status: DC

## 2021-04-28 MED ORDER — FENTANYL CITRATE (PF) 100 MCG/2ML IJ SOLN
25.0000 ug | INTRAMUSCULAR | Status: DC | PRN
  Administered 2021-04-29: 25 ug via INTRAVENOUS
  Administered 2021-04-29: 50 ug via INTRAVENOUS
  Filled 2021-04-28 (×3): qty 2

## 2021-04-28 MED ORDER — PRISMASOL BGK 4/2.5 32-4-2.5 MEQ/L EC SOLN: Status: DC

## 2021-04-28 MED ORDER — ACETAMINOPHEN 10 MG/ML IV SOLN
1000.0000 mg | Freq: Four times a day (QID) | INTRAVENOUS | Status: DC
  Administered 2021-04-28: 1000 mg via INTRAVENOUS
  Filled 2021-04-28 (×4): qty 100

## 2021-04-28 MED ORDER — PRISMASOL BGK 4/2.5 32-4-2.5 MEQ/L REPLACEMENT SOLN
Status: DC
  Filled 2021-04-28 (×3): qty 5000

## 2021-04-28 MED ORDER — ACETAMINOPHEN 650 MG RE SUPP: 650.0000 mg | Freq: Four times a day (QID) | RECTAL | Status: DC | PRN

## 2021-04-28 MED ORDER — SODIUM PHOSPHATES 45 MMOLE/15ML IV SOLN
10.0000 mmol | Freq: Once | INTRAVENOUS | Status: AC
  Administered 2021-04-28: 10 mmol via INTRAVENOUS
  Filled 2021-04-28: qty 3.33

## 2021-04-28 MED ORDER — ACETAMINOPHEN 325 MG PO TABS: 650.0000 mg | ORAL_TABLET | Freq: Four times a day (QID) | ORAL | Status: DC | PRN

## 2021-04-28 MED ORDER — SEVELAMER CARBONATE 800 MG PO TABS: 800.0000 mg | ORAL_TABLET | Freq: Three times a day (TID) | ORAL | Status: DC

## 2021-04-28 MED ORDER — MORPHINE SULFATE (PF) 2 MG/ML IV SOLN
1.0000 mg | Freq: Once | INTRAVENOUS | Status: AC
Start: 2021-04-28 — End: 2021-04-28
  Administered 2021-04-28: 1 mg via INTRAVENOUS
  Filled 2021-04-28: qty 1

## 2021-04-28 MED ORDER — ATORVASTATIN CALCIUM 20 MG PO TABS: 20.0000 mg | ORAL_TABLET | Freq: Every evening | ORAL | Status: DC

## 2021-04-28 MED ORDER — KETOROLAC TROMETHAMINE 15 MG/ML IJ SOLN
15.0000 mg | Freq: Four times a day (QID) | INTRAMUSCULAR | Status: DC | PRN
  Filled 2021-04-28: qty 1

## 2021-04-28 MED ORDER — MEGESTROL ACETATE 400 MG/10ML PO SUSP
400.0000 mg | Freq: Every day | ORAL | Status: DC
  Filled 2021-04-28 (×2): qty 10

## 2021-04-28 MED ORDER — MIDODRINE HCL 5 MG PO TABS
10.0000 mg | ORAL_TABLET | Freq: Three times a day (TID) | ORAL | Status: DC
  Administered 2021-04-29: 10 mg via ORAL
  Filled 2021-04-28: qty 2

## 2021-04-28 MED ORDER — HEPARIN SODIUM (PORCINE) 1000 UNIT/ML DIALYSIS
1000.0000 [IU] | INTRAMUSCULAR | Status: DC | PRN
  Filled 2021-04-28: qty 6

## 2021-04-28 MED ORDER — DEXTROSE 10 % IV SOLN: INTRAVENOUS | Status: DC

## 2021-04-28 MED ORDER — DOCUSATE SODIUM 50 MG/5ML PO LIQD: 100.0000 mg | Freq: Two times a day (BID) | ORAL | Status: DC

## 2021-04-28 MED ORDER — POTASSIUM CHLORIDE 10 MEQ/100ML IV SOLN
10.0000 meq | INTRAVENOUS | Status: AC
Start: 2021-04-28 — End: 2021-04-28
  Administered 2021-04-28 (×2): 10 meq via INTRAVENOUS
  Filled 2021-04-28 (×2): qty 100

## 2021-04-28 MED ORDER — VITAMIN D 25 MCG (1000 UNIT) PO TABS: 1000.0000 [IU] | ORAL_TABLET | Freq: Every day | ORAL | Status: DC

## 2021-04-28 MED ORDER — SODIUM CHLORIDE 0.9 % FOR CRRT
INTRAVENOUS_CENTRAL | Status: DC | PRN
  Filled 2021-04-28: qty 1000

## 2021-04-28 MED ORDER — ORAL CARE MOUTH RINSE: 15.0000 mL | Freq: Two times a day (BID) | OROMUCOSAL | Status: DC

## 2021-04-28 NOTE — Progress Notes (Signed)
   Patient extubated this morning to 3L nasal canula, oxygen saturations remain within normal limits, >95%, remains on CRRT Patient presents with AMS  Patient calling out continuously, yelling "please" "Ohhhh" & "oh god" "Noooo" Patient will intermittently follow commands but unable to answer any questions. Patient has equal grip in both R and L hand, able to move all extremities Attempted to assess patients needs, patient will repeat some of this RN's words but will not answer any questions or complete a sentence. Patient unable to express her needs at this time. Patient appears confused. Attempted to reorient patient with date, time, situation with no improvement.  Dr. Lanney Gins notified d/t concern for neurological status of patient. Dr. Lanney Gins assessed patient at bedside. No new orders at this time.

## 2021-04-28 NOTE — Progress Notes (Signed)
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 (36 C)] 95.54 F (35.3 C) (08/07 1100) Pulse Rate:  [51-72] 67 (08/07 1100) Resp:  [0-23] 21 (08/07 1100) BP: (98-149)/(41-122) 131/57 (08/07 1100) SpO2:  [97 %-100 %] 100 % (08/07 1100) FiO2 (%):  [30 %] 30 % (08/07 0800) Weight:  [83.3 kg] 83.3 kg (08/07 0451)  GENERAL:chronically ill apprearing HEAD: Normocephalic, atraumatic. +alopecia EYES: Pupils equal, round, reactive to light.  No scleral icterus. +periorbital edema MOUTH: Moist mucosal membrane. NECK: Supple. No thyromegaly.  No nodules. No JVD.  PULMONARY: decreased air entry bilatearlly  CARDIOVASCULAR: S1 and S2. Regular rate and rhythm. No murmurs, rubs, or gallops.  GASTROINTESTINAL: Soft, nontender, +distended abd. No masses. Positive bowel sounds. No hepatosplenomegaly.  MUSCULOSKELETAL: No swelling, clubbing, or edema.  NEUROLOGIC: GCS on sedation awakening during SBT SKIN:intact,warm,dry   PERTINENT DATA     Infusions:   prismasol BGK 4/2.5 300 mL/hr at 04/28/21 1031    prismasol BGK 4/2.5 300 mL/hr at 04/28/21 1031   albumin human 25 g (04/28/21 0857)   ampicillin-sulbactam (UNASYN) IV Stopped (04/28/21 0433)   dextrose 75 mL/hr at 04/28/21 1100   eravacycline Stopped (04/28/21 1046)   fentaNYL infusion INTRAVENOUS Stopped (04/28/21 0805)   lactated ringers     norepinephrine (LEVOPHED) Adult infusion Stopped (04/27/21 0413)   potassium chloride 10 mEq (04/28/21 1103)   prismasol BGK 4/2.5 1,000 mL/hr at 04/28/21 1031   sodium phosphate  Dextrose 5% IVPB     vasopressin Stopped (04/27/21 1312)   Scheduled Medications:  atorvastatin  20 mg Per Tube QPM   chlorhexidine gluconate (MEDLINE KIT)  15 mL Mouth Rinse BID   Chlorhexidine Gluconate Cloth  6 each Topical Q0600   cholecalciferol  1,000 Units Per Tube Daily   cholestyramine light  4 g Oral BID   docusate  100 mg Per Tube BID   ferrous sulfate  325 mg Oral Daily   heparin  5,000 Units Subcutaneous Q8H   lipase/protease/amylase  36,000 Units Oral TID AC   mouth rinse  15 mL Mouth Rinse 10 times per day   megestrol  400 mg Per Tube Daily   midodrine  10 mg Per Tube TID WC   polyethylene glycol  17 g Per Tube Daily   sevelamer carbonate  800 mg Per Tube TID WC & HS   silver sulfADIAZINE   Topical BID   cyanocobalamin  1,000 mcg Per Tube Daily   PRN Medications: acetaminophen **OR** acetaminophen, albuterol, dextrose, docusate sodium, fentaNYL (SUBLIMAZE) injection, heparin, lactated ringers, midazolam, midazolam, polyethylene  glycol, rocuronium, sodium chloride Hemodynamic parameters:   Intake/Output: 08/06 0701 - 08/07 0700 In: 3171.5 [I.V.:2170.1; NG/GT:250; IV Piggyback:751.3] Out: 4481   Ventilator  Settings: Vent Mode: PRVC FiO2 (%):  [30 %] 30 % Set Rate:  [20 bmp] 20 bmp Vt Set:  [450 mL-4560 mL] 450 mL PEEP:  [5 cmH20] 5 cmH20 Plateau Pressure:  [22 cmH20-26 cmH20] 22 cmH20   LAB RESULTS:  Basic Metabolic Panel: Recent Labs  Lab 04/25/21 1651 04/25/21 1838 04/26/21 0415 04/26/21 1932 04/27/21 0417 04/27/21 1608 04/28/21 0447  NA 133*  --  131*  --  131* 133* 133*  K 4.9  --  5.0  --  4.0 3.8 2.8*  CL 100  --  100  --  100 100 101  CO2 24  --  24  --  26 25 27   GLUCOSE 46*  --  103*  --  119* 106* 114*  BUN 14  --  18  --  14 10 6*  CREATININE 3.63*  --  4.04*  --  2.68* 2.04* 1.27*  CALCIUM 8.0*  --  7.0*  --  8.1* 8.3* 8.5*  MG 1.7 1.7 1.6* 1.6*  --   --  1.7  PHOS  --   --  3.9  --  2.5 2.0* 1.5*    Liver Function Tests: Recent Labs  Lab 04/25/21 1651 04/26/21 0415 04/27/21 0417 04/27/21 1608 04/28/21 0447  AST 23 23  --   --   --   ALT 14 11  --   --   --   ALKPHOS 137* 100  --   --   --   BILITOT 0.5 0.8  --   --   --   PROT 7.6 5.5*  --   --   --   ALBUMIN 2.3* 1.6* 2.3* 2.2* 2.3*    Recent Labs  Lab 04/25/21 1651  LIPASE 20    Recent Labs  Lab 04/26/21 0415  AMMONIA 55*    CBC: Recent Labs  Lab 04/25/21 1651 04/26/21 0415 04/27/21 0417 04/28/21 0447  WBC 5.7 4.1 11.3* 10.2  NEUTROABS 4.5 3.4  --   --   HGB 9.1* 8.1* 7.7* 7.0*  HCT 28.8* 25.5* 24.1* 21.4*  MCV 89.7 91.1 88.3 87.3  PLT 245 188 149* 140*    Cardiac Enzymes: No results for input(s): CKTOTAL, CKMB, CKMBINDEX, TROPONINI in the last 168 hours. BNP: Invalid input(s): POCBNP CBG: Recent Labs  Lab 04/27/21 1539 04/27/21 1902 04/28/21 0113 04/28/21 0400 04/28/21 0735  GLUCAP 99 106* 113* 132* 114*        IMAGING RESULTS:  Imaging: DG Abd 1 View  Result Date:  04/26/2021 CLINICAL DATA:  Check gastric catheter placement EXAM: ABDOMEN - 1 VIEW COMPARISON:  None. FINDINGS: Scattered large and small bowel gas is noted. Contrast material is noted within the colon. Gastric catheter is noted extending into the stomach. Changes of prior cholecystectomy are seen. IMPRESSION: Gastric catheter within the stomach. Electronically Signed   By: Inez Catalina M.D.   On: 04/26/2021 19:03   DG Chest Port 1 View  Result Date: 04/26/2021 CLINICAL DATA:  Check endotracheal tube placement EXAM: PORTABLE CHEST 1 VIEW COMPARISON:  04/25/2021 FINDINGS: Cardiac shadow is enlarged but stable. Dialysis catheter is again noted on the left as well as prior vascular stenting on the left. Endotracheal tube is noted in satisfactory position. Gastric catheter is noted extending into the stomach. Persistent bibasilar densities are noted left greater than right with associated effusions. The overall appearance is stable from the prior exam. No bony abnormality is noted. IMPRESSION: Tubes and lines in satisfactory position. Persistent bibasilar opacity left greater than right. Electronically Signed   By: Inez Catalina M.D.   On: 04/26/2021 19:05   @PROBHOSP @ No results found.   ASSESSMENT AND PLAN      -Multidisciplinary rounds held today  Septic shock -present on admission - due to bacteremia with acinitebacter on blood cultures -ID on case appreciate input- Unasyn -use vasopressors to keep MAP>65 -follow ABG and LA -follow up cultures -emperic  ABX -consider stress dose steroids -on MV - PRVC - SBT today for extubation. -c/w RRT per nephrology     Chronic diastolic CHF    - repeat TTE due to bacteremia -oxygen as needed -Lasix as tolerated -follow up cardiac enzymes as indicated ICU monitoring  Renal Failure-esrd on HD -follow chem 7 -follow UO -continue Foley Catheter-assess need daily -pharmD for electrolytes -nephrology for RRT   ID -continue IV abx as  prescibed -follow up cultures  GI/Nutrition GI PROPHYLAXIS as indicated DIET-->TF's as tolerated Constipation protocol as indicated  ENDO - ICU hypoglycemic\Hyperglycemia protocol -check FSBS per protocol   ELECTROLYTES -follow labs as needed -replace as needed -pharmacy consultation   DVT/GI PRX ordered -SCDs  TRANSFUSIONS AS NEEDED MONITOR FSBS ASSESS the need for LABS as needed   Critical care provider statement:   Total critical care time: 33 minutes   Performed by: Lanney Gins MD   Critical care time was exclusive of separately billable procedures and treating other patients.   Critical care was necessary to treat or prevent imminent or life-threatening deterioration.   Critical care was time spent personally by me on the following activities: development of treatment plan with patient and/or surrogate as well as nursing, discussions with consultants, evaluation of patient's response to treatment, examination of patient, obtaining history from patient or surrogate, ordering and performing treatments and interventions, ordering and review of laboratory studies, ordering and review of radiographic studies, pulse oximetry and re-evaluation of patient's condition.      This document was prepared using Dragon voice recognition software and may include unintentional dictation errors.    Ottie Glazier, M.D.  Division of [facility]

## 2021-04-28 NOTE — Progress Notes (Signed)
[name] MRN: [id number] DOB: 01-03-1961   Cancelled Treatment:    Reason Eval/Treat Not Completed: Patient not medically ready PT orders received, chart reviewed. Pt intubated following PT orders. Due to significant change in status will complete current orders & await new ones if/when pt is medically stable & PT evaluation is warranted.  Lavone Nian, PT, DPT 04/28/21, 7:25 AM    [name] 04/28/2021, 7:24 AM

## 2021-04-28 NOTE — Progress Notes (Signed)
Patient continuously yelling out since extubation very loudly. Both this RN and primary RN have been to the bedside to speak with patient and attempt to calm her. When this RN asked patient if she was having pain, patient stopped yelling and nodded head yes. When I asked where she was having pain, patient looked at me for long period of time with look of confusion on her face, then repeated back at me "Where is pain?" I again asked "Where is your pain?" And patient repeated back to me "Where you pain." I told patient "I'm not having any pain, where are you having pain." Patient just looked confused and didn't answer. Patient will intermittently follow commands, but only simple ones.  Patient seems to be mimicking nurses behaviors, for example, repeating statements and questions back to [place], if staff nods their head at patient, patient will nod head back. When speaking with e-link nurse about our concerns, I gave the example to the nursethat I may ask her if her ear hurts and point to it and she may nod, and several minutes after saying that, patient began to loudly repeat "My ear hurts, my ear hurts."   Concerns regarding patient's neuro status brought to the attention of Dr. Lanney Gins. Dr. Lanney Gins stated that he wasn't concerned, and expected that it would take 12-24 hours for patient to clear because of her ESRD. Patient is on CRRT with 100 of UF every hour, and was on low-dose fentanyl while intubated.

## 2021-04-28 NOTE — Progress Notes (Signed)
[facility]  ROUNDING NOTE   Subjective:   [name] was admitted to [facility] on 04/25/2021 for Sepsis [facility]) [A41.9] Severe sepsis ([place]) [A41.9, R65.20] Fever in other diseases [R50.81]  Remains critically ill Off pressors now CRRT continued.  Potassium noted to be low   Objective:  Vital signs in last 24 hours:  Temp:  [94.1 F (34.5 C)-96.8 F (36 C)] 96.08 F (35.6 C) (08/07 0900) Pulse Rate:  [50-73] 60 (08/07 0900) Resp:  [0-23] 0 (08/07 0900) BP: (98-149)/(41-122) 130/52 (08/07 0900) SpO2:  [97 %-100 %] 100 % (08/07 0900) FiO2 (%):  [30 %] 30 % (08/07 0755) Weight:  [83.3 kg] 83.3 kg (08/07 0451)  Weight change: 1.1 kg Filed Weights   04/25/21 1609 04/27/21 0418 04/28/21 0451  Weight: 52 kg 82.2 kg 83.3 kg    Intake/Output: I/O last 3 completed shifts: In: 4799.8 [I.V.:3308.4; NG/GT:340; IV Piggyback:1151.4] Out: 4695 [Other:4695]   Intake/Output this shift:  Total I/O In: 228.2 [I.V.:175.2; NG/GT:50; IV Piggyback:3] Out: 232 [Other:232]  Physical Exam: General: Critically ill appearing  Head: Normocephalic, atraumatic. Moist oral mucosal membranes  Lungs:  Clear to auscultation  Heart: Regular rate and rhythm  Abdomen:  Soft, nontender,   Extremities: ++ peripheral edema.  Neurologic: drowsy  Skin: No lesions  Access: Right IJ temporary dialysis catheter    Basic Metabolic Panel: Recent Labs  Lab 04/25/21 1651 04/25/21 1838 04/26/21 0415 04/26/21 1932 04/27/21 0417 04/27/21 1608 04/28/21 0447  NA 133*  --  131*  --  131* 133* 133*  K 4.9  --  5.0  --  4.0 3.8 2.8*  CL 100  --  100  --  100 100 101  CO2 24  --  24  --  26 25 27   GLUCOSE 46*  --  103*  --  119* 106* 114*  BUN 14  --  18  --  14 10 6*  CREATININE 3.63*  --  4.04*  --  2.68* 2.04* 1.27*  CALCIUM 8.0*  --  7.0*  --  8.1* 8.3* 8.5*  MG 1.7 1.7 1.6* 1.6*  --   --  1.7  PHOS  --   --  3.9  --  2.5 2.0* 1.5*     Liver Function Tests: Recent Labs  Lab  04/25/21 1651 04/26/21 0415 04/27/21 0417 04/27/21 1608 04/28/21 0447  AST 23 23  --   --   --   ALT 14 11  --   --   --   ALKPHOS 137* 100  --   --   --   BILITOT 0.5 0.8  --   --   --   PROT 7.6 5.5*  --   --   --   ALBUMIN 2.3* 1.6* 2.3* 2.2* 2.3*    Recent Labs  Lab 04/25/21 1651  LIPASE 20    Recent Labs  Lab 04/26/21 0415  AMMONIA 55*     CBC: Recent Labs  Lab 04/25/21 1651 04/26/21 0415 04/27/21 0417 04/28/21 0447  WBC 5.7 4.1 11.3* 10.2  NEUTROABS 4.5 3.4  --   --   HGB 9.1* 8.1* 7.7* 7.0*  HCT 28.8* 25.5* 24.1* 21.4*  MCV 89.7 91.1 88.3 87.3  PLT 245 188 149* 140*     Cardiac Enzymes: No results for input(s): CKTOTAL, CKMB, CKMBINDEX, TROPONINI in the last 168 hours.  BNP: Invalid input(s): POCBNP  CBG: Recent Labs  Lab 04/27/21 1539 04/27/21 1902 04/28/21 0113 04/28/21 0400 04/28/21  [place]*     Microbiology: Results for orders placed or performed during the hospital encounter of 04/25/21  Culture, blood (Routine x 2)     Status: Abnormal (Preliminary result)   Collection Time: 04/25/21  4:51 PM   Specimen: BLOOD  Result Value Ref Range Status   Specimen Description   Final    BLOOD LW Performed at [facility], [address], [place], [place] [address]    Special Requests   Final    BOTTLES DRAWN AEROBIC AND ANAEROBIC Blood Culture adequate volume Performed at [facility], [address] [place], [place] [address]    Culture  Setup Time   Final    GRAM VARIABLE COCCOBACILLI AEROBIC BOTTLE ONLY Organism ID to follow CRITICAL RESULT CALLED TO, READ BACK BY AND VERIFIED WITH: [facility] AT [id number] 04/26/21.PMF Performed at [facility], [address] [place], [place] [address]    Culture (A)  Final    ACINETOBACTER SPECIES SUSCEPTIBILITIES TO FOLLOW Performed at [facility], [place] [address] [place], [place] [address]    Report Status PENDING  Incomplete   Blood Culture ID Panel (Reflexed)     Status: Abnormal   Collection Time: 04/25/21  4:51 PM  Result Value Ref Range Status   Enterococcus faecalis NOT DETECTED NOT DETECTED Final   Enterococcus Faecium NOT DETECTED NOT DETECTED Final   Listeria monocytogenes NOT DETECTED NOT DETECTED Final   Staphylococcus species NOT DETECTED NOT DETECTED Final   Staphylococcus aureus (BCID) NOT DETECTED NOT DETECTED Final   Staphylococcus epidermidis NOT DETECTED NOT DETECTED Final   Staphylococcus lugdunensis NOT DETECTED NOT DETECTED Final   Streptococcus species NOT DETECTED NOT DETECTED Final   Streptococcus agalactiae NOT DETECTED NOT DETECTED Final   Streptococcus pneumoniae NOT DETECTED NOT DETECTED Final   Streptococcus pyogenes NOT DETECTED NOT DETECTED Final   A.calcoaceticus-baumannii DETECTED (A) NOT DETECTED Final    Comment: CRITICAL RESULT CALLED TO, READ BACK BY AND VERIFIED WITH: CHARLES SHANLEVER AT 1014 04/26/21.PMF    Bacteroides fragilis NOT DETECTED NOT DETECTED Final   Enterobacterales NOT DETECTED NOT DETECTED Final   Enterobacter cloacae complex NOT DETECTED NOT DETECTED Final   Escherichia coli NOT DETECTED NOT DETECTED Final   Klebsiella aerogenes NOT DETECTED NOT DETECTED Final   Klebsiella oxytoca NOT DETECTED NOT DETECTED Final   Klebsiella pneumoniae NOT DETECTED NOT DETECTED Final   Proteus species NOT DETECTED NOT DETECTED Final   Salmonella species NOT DETECTED NOT DETECTED Final   Serratia marcescens NOT DETECTED NOT DETECTED Final   Haemophilus influenzae NOT DETECTED NOT DETECTED Final   Neisseria meningitidis NOT DETECTED NOT DETECTED Final   Pseudomonas aeruginosa NOT DETECTED NOT DETECTED Final   Stenotrophomonas maltophilia NOT DETECTED NOT DETECTED Final   Candida albicans NOT DETECTED NOT DETECTED Final   Candida auris NOT DETECTED NOT DETECTED Final   Candida glabrata NOT DETECTED NOT DETECTED Final   Candida krusei NOT DETECTED NOT DETECTED Final    Candida parapsilosis NOT DETECTED NOT DETECTED Final   Candida tropicalis NOT DETECTED NOT DETECTED Final   Cryptococcus neoformans/gattii NOT DETECTED NOT DETECTED Final   CTX-M ESBL NOT DETECTED NOT DETECTED Final   Carbapenem resistance IMP NOT DETECTED NOT DETECTED Final   Carbapenem resistance KPC NOT DETECTED NOT DETECTED Final   Carbapenem resistance NDM NOT DETECTED NOT DETECTED Final   Carbapenem resistance VIM NOT DETECTED NOT DETECTED Final    Comment: Performed at [facility]  [facility], [address] [place], [place] [address]  Culture, blood (Routine x 2)     Status: Abnormal (Preliminary result)   Collection Time: 04/25/21  6:38 PM   Specimen: BLOOD  Result Value Ref Range Status   Specimen Description   Final    BLOOD BLOOD LEFT WRIST Performed at [facility], [address] [place], [place] [address]    Special Requests   Final    BOTTLES DRAWN AEROBIC AND ANAEROBIC Blood Culture adequate volume Performed at [facility], [address] [place], [place] [address]    Culture  Setup Time   Final    GRAM VARIABLE COCCOBACILLI AEROBIC BOTTLE ONLY CRITICAL VALUE NOTED.  VALUE IS CONSISTENT WITH PREVIOUSLY REPORTED AND CALLED VALUE. Performed at [facility], [address], [place], [place] [address]    Culture (A)  Final    ACINETOBACTER SPECIES CULTURE REINCUBATED FOR BETTER GROWTH Performed at [facility], [place] [address] [place], [place] [address]    Report Status PENDING  Incomplete  Resp Panel by RT-PCR (Flu A&B, Covid) Nasopharyngeal Swab     Status: None   Collection Time: 04/25/21  6:54 PM   Specimen: Nasopharyngeal Swab; Nasopharyngeal(NP) swabs in vial transport medium  Result Value Ref Range Status   SARS Coronavirus 2 by RT PCR NEGATIVE NEGATIVE Final    Comment: (NOTE) SARS-CoV-2 target nucleic acids are NOT DETECTED.  The SARS-CoV-2 RNA is generally detectable in upper respiratory specimens during the acute  phase of infection. The lowest concentration of SARS-CoV-2 viral copies this assay can detect is 138 copies/mL. A negative result does not preclude SARS-Cov-2 infection and should not be used as the sole basis for treatment or other patient management decisions. A negative result may occur with  improper specimen collection/handling, submission of specimen other than nasopharyngeal swab, presence of viral mutation(s) within the areas targeted by this assay, and inadequate number of viral copies(<138 copies/mL). A negative result must be combined with clinical observations, patient history, and epidemiological information. The expected result is Negative.  Fact Sheet for Patients:  EntrepreneurPulse.com.au  Fact Sheet for Healthcare Providers:  IncredibleEmployment.be  This test is no t yet approved or cleared by the [place] FDA and  has been authorized for detection and/or diagnosis of SARS-CoV-2 by FDA under an Emergency Use Authorization (EUA). This EUA will remain  in effect (meaning this test can be used) for the duration of the COVID-19 declaration under Section 564(b)(1) of the Act, 21 U.S.C.section 360bbb-3(b)(1), unless the authorization is terminated  or revoked sooner.       Influenza A by PCR NEGATIVE NEGATIVE Final   Influenza B by PCR NEGATIVE NEGATIVE Final    Comment: (NOTE) The Xpert Xpress SARS-CoV-2/FLU/RSV plus assay is intended as an aid in the diagnosis of influenza from Nasopharyngeal swab specimens and should not be used as a sole basis for treatment. Nasal washings and aspirates are unacceptable for Xpert Xpress SARS-CoV-2/FLU/RSV testing.  Fact Sheet for Patients: EntrepreneurPulse.com.au  Fact Sheet for Healthcare Providers: IncredibleEmployment.be  This test is not yet approved or cleared by the [place] FDA and has been authorized for detection and/or diagnosis of  SARS-CoV-2 by FDA under an Emergency Use Authorization (EUA). This EUA will remain in effect (meaning this test can be used) for the duration of the COVID-19 declaration under Section 564(b)(1) of the Act, 21 U.S.C. section 360bbb-3(b)(1), unless the authorization is terminated or revoked.  Performed at [facility], [id number] [place], [place] [address]   MRSA Next Gen by PCR, Nasal     Status: None   Collection Time: 04/26/21  6:35 PM   Specimen: Nasal Mucosa; Nasal Swab  Result Value Ref Range Status   MRSA by PCR Next Gen NOT DETECTED NOT DETECTED Final    Comment: (NOTE) The GeneXpert MRSA Assay (FDA approved for NASAL specimens only), is one component of a comprehensive MRSA colonization surveillance program. It is not intended to diagnose MRSA infection nor to guide or monitor treatment for MRSA infections. Test performance is not FDA approved in patients less than 36 years old. Performed at [facility], [address] [place], [place] [address]     Coagulation Studies: Recent Labs    04/25/21 1651  LABPROT 15.1  INR 1.2     Urinalysis: No results for input(s): COLORURINE, LABSPEC, PHURINE, GLUCOSEU, HGBUR, BILIRUBINUR, KETONESUR, PROTEINUR, UROBILINOGEN, NITRITE, LEUKOCYTESUR in the last 72 hours.  Invalid input(s): APPERANCEUR    Imaging: DG Abd 1 View  Result Date: 04/26/2021 CLINICAL DATA:  Check gastric catheter placement EXAM: ABDOMEN - 1 VIEW COMPARISON:  None. FINDINGS: Scattered large and small bowel gas is noted. Contrast material is noted within the colon. Gastric catheter is noted extending into the stomach. Changes of prior cholecystectomy are seen. IMPRESSION: Gastric catheter within the stomach. Electronically Signed   By: Inez Catalina M.D.   On: 04/26/2021 19:03   DG Chest Port 1 View  Result Date: 04/26/2021 CLINICAL DATA:  Check endotracheal tube placement EXAM: PORTABLE CHEST 1 VIEW COMPARISON:  04/25/2021 FINDINGS:  Cardiac shadow is enlarged but stable. Dialysis catheter is again noted on the left as well as prior vascular stenting on the left. Endotracheal tube is noted in satisfactory position. Gastric catheter is noted extending into the stomach. Persistent bibasilar densities are noted left greater than right with associated effusions. The overall appearance is stable from the prior exam. No bony abnormality is noted. IMPRESSION: Tubes and lines in satisfactory position. Persistent bibasilar opacity left greater than right. Electronically Signed   By: Inez Catalina M.D.   On: 04/26/2021 19:05     Medications:     prismasol BGK 4/2.5      prismasol BGK 4/2.5     albumin human 25 g (04/28/21 0857)   ampicillin-sulbactam (UNASYN) IV Stopped (04/28/21 0433)   dextrose     eravacycline Stopped (04/27/21 2205)   fentaNYL infusion INTRAVENOUS Stopped (04/28/21 0805)   lactated ringers     norepinephrine (LEVOPHED) Adult infusion Stopped (04/27/21 0413)   potassium chloride     prismasol BGK 4/2.5     sodium phosphate  Dextrose 5% IVPB     vasopressin Stopped (04/27/21 1312)    atorvastatin  20 mg Per Tube QPM   chlorhexidine gluconate (MEDLINE KIT)  15 mL Mouth Rinse BID   Chlorhexidine Gluconate Cloth  6 each Topical Q0600   cholecalciferol  1,000 Units Per Tube Daily   cholestyramine light  4 g Oral BID   docusate  100 mg Per Tube BID   ferrous sulfate  325 mg Oral Daily   heparin  5,000 Units Subcutaneous Q8H   lipase/protease/amylase  36,000 Units Oral TID AC   mouth rinse  15 mL Mouth Rinse 10 times per day   megestrol  400 mg Per Tube Daily   midodrine  10 mg Per Tube TID WC   polyethylene glycol  17 g Per Tube Daily   sevelamer carbonate  800 mg  Per Tube TID WC & HS   silver sulfADIAZINE   Topical BID   cyanocobalamin  1,000 mcg Per Tube Daily   acetaminophen **OR** acetaminophen, albuterol, dextrose, docusate sodium, fentaNYL (SUBLIMAZE) injection, heparin, lactated ringers, midazolam,  midazolam, polyethylene glycol, rocuronium, sodium chloride  Assessment/ Plan:  [name] is a 60 y.o. black female with end stage renal disease on hemodialysis for greater than 14 years, hypertension, congestive heart failure, coronary artery disease, diabetes mellitus type II, who is admitted to [facility] on 04/25/2021 for Sepsis ([place]) [A41.9] Severe sepsis ([place]) [A41.9, R65.20] Fever in other diseases [R50.81]   [facility] M[facility] Permcath 74kg   End Stage Renal Disease on dialysis: Patient is currently continued on CRRT because of hypotension, requirement for pressors and hemodynamic instability.  Changed to 4K formula today; reduced dose of dialysate; start UF 100 cc/h HD/CRRT via temp cath  2. Sepsis: with hypotension and fever.  - permcath removed, tip cultures are pending - Acinetobacter bacteremia   - vasopressors and critical care as above.    3. Anemia of chronic kidney disease  Lab Results  Component Value Date   HGB 7.0 (L) 04/28/2021   Scheduled EPO SQ weekly saturday   4. Secondary Hyperparathyroidism:    Lab Results  Component Value Date   PTH 357 (H) 10/05/2018   CALCIUM 8.5 (L) 04/28/2021   CAION 1.09 (L) 04/13/2020   PHOS 1.5 (L) 04/28/2021       Hold phosphate binders until able to have oral intake.   5.  Hypokalemia IV potassium supplementation Changed potassium bath to 4K IV phosphorus supplementation x1    LOS: 3 Mariaceleste Herrera 8/7/20229:34 AM

## 2021-04-28 NOTE — Progress Notes (Addendum)
Subjective  - POD #2  Remains intubated and sedated   Physical Exam:  Prevena wound vacs in place and the bilateral groins Right foot appears more pale than the left.  Ulcerations bilaterally with ischemic changes to several toes No evidence of compartment syndrome Abdomen soft nontender       Assessment/Plan:  POD #2  Cardiac: Pressor requirement has decreased overnight.  Her heart rate has come down somewhat.  She remains hemodynamically stable.  Cardiology has consulted and did not feel that this is a primary cardiac event.  Echo results are pending.  We will continue to wean her pressor requirement.  Pulmonary: Remains intubated and sedated.  Steroids for COPD exacerbation  Renal: Nephrology managing peritoneal dialysis  Acidosis: Lactate is decreasing and bicarb has improved.  Acute blood loss anemia: The patient is not actively bleeding.  She did receive blood yesterday.  ID: Remains on broad-spectrum antibiotics  I had an extensive conversation with her daughter who is the primary caregiver.  She understands the gravity of the situation.  We will continue to monitor her and treat her aggressively in hopes of weaning her pressors  [name] 04/28/2021 6:37 PM --  Vitals:   04/28/21 1730 04/28/21 1800  BP: (!) 134/59 (!) 126/53  Pulse: 66 62  Resp: 15 12  Temp: (!) 93.38 F (34.1 C) (!) 93.38 F (34.1 C)  SpO2: 100% 100%    Intake/Output Summary (Last 24 hours) at 04/28/2021 1837 Last data filed at 04/28/2021 1800 Gross per 24 hour  Intake 3561.15 ml  Output 4002 ml  Net -440.85 ml     Laboratory CBC    Component Value Date/Time   WBC 10.2 04/28/2021 0447   HGB 7.5 (L) 04/28/2021 1718   HGB 10.5 (L) 09/15/2014 0948   HCT 23.7 (L) 04/28/2021 1718   HCT 34.2 (L) 09/15/2014 0948   PLT 140 (L) 04/28/2021 0447   PLT 203 09/15/2014 0948    BMET    Component Value Date/Time   NA 133 (L) 04/28/2021 0447   NA 135 (L) 09/15/2014 0948   K 2.8  (L) 04/28/2021 0447   K 4.8 09/15/2014 0948   CL 101 04/28/2021 0447   CL 99 09/15/2014 0948   CO2 27 04/28/2021 0447   CO2 26 09/15/2014 0948   GLUCOSE 114 (H) 04/28/2021 0447   GLUCOSE 118 (H) 09/15/2014 0948   BUN 6 (L) 04/28/2021 0447   BUN 19 (H) 09/15/2014 0948   CREATININE 1.27 (H) 04/28/2021 0447   CREATININE 6.79 (H) 09/15/2014 0948   CALCIUM 8.5 (L) 04/28/2021 0447   CALCIUM 8.3 (L) 09/15/2014 0948   GFRNONAA 48 (L) 04/28/2021 0447   GFRNONAA 7 (L) 09/15/2014 0948   GFRNONAA 6 (L) 05/31/2014 0432   GFRAA 11 (L) 06/25/2020 0641   GFRAA 8 (L) 09/15/2014 0948   GFRAA 7 (L) 05/31/2014 0432    COAG Lab Results  Component Value Date   INR 1.2 04/25/2021   INR 1.3 (H) 03/29/2021   INR 1.3 (H) 03/17/2021   No results found for: PTT  Antibiotics Anti-infectives (From admission, onward)    Start     Dose/Rate Route Frequency Ordered Stop   04/26/21 2000  Ampicillin-Sulbactam (UNASYN) 3 g in sodium chloride 0.9 % 100 mL IVPB        3 g 200 mL/hr over 30 Minutes Intravenous Every 8 hours 04/26/21 1750     04/26/21 1800  eravacycline (XERAVA) 50 mg in sodium chloride 0.9 %  150 mL IVPB        50 mg 150 mL/hr over 60 Minutes Intravenous Every 12 hours 04/26/21 1515 04/28/21 2359   04/26/21 1200  Ampicillin-Sulbactam (UNASYN) 3 g in sodium chloride 0.9 % 100 mL IVPB  Status:  Discontinued        3 g 200 mL/hr over 30 Minutes Intravenous Every 12 hours 04/26/21 1109 04/26/21 1750   04/25/21 2300  piperacillin-tazobactam (ZOSYN) IVPB 2.25 g  Status:  Discontinued        2.25 g 100 mL/hr over 30 Minutes Intravenous Every 8 hours 04/25/21 2156 04/26/21 1109   04/25/21 2200  vancomycin (VANCOREADY) IVPB 1250 mg/250 mL        1,250 mg 166.7 mL/hr over 90 Minutes Intravenous  Once 04/25/21 2151 04/26/21 0155   04/25/21 2157  vancomycin (VANCOREADY) IVPB 500 mg/100 mL  Status:  Discontinued        500 mg 100 mL/hr over 60 Minutes Intravenous Every Dialysis 04/25/21 2158 04/26/21  1112   04/25/21 2100  cefTRIAXone (ROCEPHIN) 1 g in sodium chloride 0.9 % 100 mL IVPB        1 g 200 mL/hr over 30 Minutes Intravenous  Once 04/25/21 2059 04/25/21 2155   04/25/21 2100  azithromycin (ZITHROMAX) 500 mg in sodium chloride 0.9 % 250 mL IVPB        500 mg 250 mL/hr over 60 Minutes Intravenous  Once 04/25/21 2059 04/25/21 2256        V. Leia Alf, M.D., [facility] [place] Office: [phone] Pager:  [phone]

## 2021-04-28 NOTE — Progress Notes (Signed)
ID Extubated this morning awake Not following commands   BP (!) 126/53   Pulse 62   Temp (!) 93.38 F (34.1 C)   Resp 12   Ht 5\' 5"  (1.651 m)   Wt 83.3 kg   SpO2 100%   BMI 30.56 kg/m    Awake But does not follow commands Tries to say yes B/l air entry Anasarca Abs distended  labs  CBC Latest Ref Rng & Units 04/28/2021 04/28/2021 04/27/2021  WBC 4.0 - 10.5 K/uL - 10.2 11.3(H)  Hemoglobin 12.0 - 15.0 g/dL 7.5(L) 7.0(L) 7.7(L)  Hematocrit 36.0 - 46.0 % 23.7(L) 21.4(L) 24.1(L)  Platelets 150 - 400 K/uL - 140(L) 149(L)    CMP Latest Ref Rng & Units 04/28/2021 04/27/2021 04/27/2021  Glucose 70 - 99 mg/dL 114(H) 106(H) 119(H)  BUN 8 - 23 mg/dL 6(L) 10 14  Creatinine 0.44 - 1.00 mg/dL 1.27(H) 2.04(H) 2.68(H)  Sodium 135 - 145 mmol/L 133(L) 133(L) 131(L)  Potassium 3.5 - 5.1 mmol/L 2.8(L) 3.8 4.0  Chloride 98 - 111 mmol/L 101 100 100  CO2 22 - 32 mmol/L 27 25 26   Calcium 8.9 - 10.3 mg/dL 8.5(L) 8.3(L) 8.1(L)  Total Protein 6.5 - 8.1 g/dL - - -  Total Bilirubin 0.3 - 1.2 mg/dL - - -  Alkaline Phos 38 - 126 U/L - - -  AST 15 - 41 U/L - - -  ALT 0 - 44 U/L - - -     Micro BC - acinetobacter   Impression/recommendation Sepsis secondary to Acinetobacter secondary to infected dialysis catheter Patient is currently on Unasyn and eravacyclin.  As Acinetobacter susceptible organism we can stop eravacycline.  End-stage renal disease with dialysis catheter which has been removed.   Severe hypoalbuminemia causing anasarca  Protein calorie malnutrition  Encephalopathy was intubated -now extubated  Discussed the management with the care team

## 2021-04-28 NOTE — Progress Notes (Signed)
Patient extubated to 3L [place] per MD request. Sats 100% at this time.

## 2021-04-28 NOTE — Progress Notes (Signed)
*  PRELIMINARY RESULTS* Echocardiogram 2D Echocardiogram has been performed.  [name] 04/28/2021, 6:58 AM

## 2021-04-29 ENCOUNTER — Inpatient Hospital Stay: Payer: Medicare Other

## 2021-04-29 DIAGNOSIS — Z7189 Other specified counseling: Secondary | ICD-10-CM

## 2021-04-29 DIAGNOSIS — R7881 Bacteremia: Secondary | ICD-10-CM | POA: Diagnosis not present

## 2021-04-29 DIAGNOSIS — N186 End stage renal disease: Secondary | ICD-10-CM | POA: Diagnosis not present

## 2021-04-29 DIAGNOSIS — R6520 Severe sepsis without septic shock: Secondary | ICD-10-CM | POA: Diagnosis not present

## 2021-04-29 DIAGNOSIS — Z515 Encounter for palliative care: Secondary | ICD-10-CM

## 2021-04-29 DIAGNOSIS — A419 Sepsis, unspecified organism: Secondary | ICD-10-CM | POA: Diagnosis not present

## 2021-04-29 LAB — RENAL FUNCTION PANEL
Albumin: 2.6 g/dL — ABNORMAL LOW (ref 3.5–5.0)
Anion gap: 3 — ABNORMAL LOW (ref 5–15)
BUN: 6 mg/dL — ABNORMAL LOW (ref 8–23)
CO2: 28 mmol/L (ref 22–32)
Calcium: 8.3 mg/dL — ABNORMAL LOW (ref 8.9–10.3)
Chloride: 101 mmol/L (ref 98–111)
Creatinine, Ser: 1.1 mg/dL — ABNORMAL HIGH (ref 0.44–1.00)
GFR, Estimated: 57 mL/min — ABNORMAL LOW (ref >60)
Glucose, Bld: 145 mg/dL — ABNORMAL HIGH (ref 70–99)
Phosphorus: 2 mg/dL — ABNORMAL LOW (ref 2.5–4.6)
Potassium: 3.1 mmol/L — ABNORMAL LOW (ref 3.5–5.1)
Sodium: 132 mmol/L — ABNORMAL LOW (ref 135–145)

## 2021-04-29 LAB — MAGNESIUM: Magnesium: 1.9 mg/dL (ref 1.7–2.4)

## 2021-04-29 LAB — CBC
HCT: 21.9 % — ABNORMAL LOW (ref 36.0–46.0)
Hemoglobin: 7.1 g/dL — ABNORMAL LOW (ref 12.0–15.0)
MCH: 29.2 pg (ref 26.0–34.0)
MCHC: 32.4 g/dL (ref 30.0–36.0)
MCV: 90.1 fL (ref 80.0–100.0)
Platelets: 115 10*3/uL — ABNORMAL LOW (ref 150–400)
RBC: 2.43 MIL/uL — ABNORMAL LOW (ref 3.87–5.11)
RDW: 21.8 % — ABNORMAL HIGH (ref 11.5–15.5)
WBC: 6.2 10*3/uL (ref 4.0–10.5)
nRBC: 0 % (ref 0.0–0.2)

## 2021-04-29 LAB — HEPATIC FUNCTION PANEL
ALT: 13 U/L (ref 0–44)
AST: 16 U/L (ref 15–41)
Albumin: 2.9 g/dL — ABNORMAL LOW (ref 3.5–5.0)
Alkaline Phosphatase: 140 U/L — ABNORMAL HIGH (ref 38–126)
Bilirubin, Direct: 0.2 mg/dL (ref 0.0–0.2)
Indirect Bilirubin: 0.4 mg/dL (ref 0.3–0.9)
Total Bilirubin: 0.6 mg/dL (ref 0.3–1.2)
Total Protein: 6 g/dL — ABNORMAL LOW (ref 6.5–8.1)

## 2021-04-29 LAB — GLUCOSE, CAPILLARY
Glucose-Capillary: 108 mg/dL — ABNORMAL HIGH (ref 70–99)
Glucose-Capillary: 113 mg/dL — ABNORMAL HIGH (ref 70–99)
Glucose-Capillary: 125 mg/dL — ABNORMAL HIGH (ref 70–99)
Glucose-Capillary: 128 mg/dL — ABNORMAL HIGH (ref 70–99)
Glucose-Capillary: 134 mg/dL — ABNORMAL HIGH (ref 70–99)
Glucose-Capillary: 50 mg/dL — ABNORMAL LOW (ref 70–99)
Glucose-Capillary: 93 mg/dL (ref 70–99)

## 2021-04-29 LAB — PREPARE RBC (CROSSMATCH)

## 2021-04-29 LAB — HEMOGLOBIN AND HEMATOCRIT, BLOOD
HCT: 26.6 % — ABNORMAL LOW (ref 36.0–46.0)
Hemoglobin: 8.6 g/dL — ABNORMAL LOW (ref 12.0–15.0)

## 2021-04-29 LAB — HEPATITIS B SURFACE ANTIGEN: Hepatitis B Surface Ag: NONREACTIVE

## 2021-04-29 MED ORDER — POTASSIUM PHOSPHATES 15 MMOLE/5ML IV SOLN
15.0000 mmol | Freq: Once | INTRAVENOUS | Status: AC
  Administered 2021-04-29: 15 mmol via INTRAVENOUS
  Filled 2021-04-29: qty 5

## 2021-04-29 MED ORDER — DEXMEDETOMIDINE HCL IN NACL 400 MCG/100ML IV SOLN
0.4000 ug/kg/h | INTRAVENOUS | Status: DC
  Administered 2021-04-29: 0.4 ug/kg/h via INTRAVENOUS
  Filled 2021-04-29: qty 100

## 2021-04-29 MED ORDER — FENTANYL CITRATE (PF) 100 MCG/2ML IJ SOLN
50.0000 ug | Freq: Once | INTRAMUSCULAR | Status: AC
  Administered 2021-04-29: 50 ug via INTRAVENOUS

## 2021-04-29 MED ORDER — VITAMIN B-12 1000 MCG PO TABS
1000.0000 ug | ORAL_TABLET | Freq: Every day | ORAL | Status: DC
  Administered 2021-04-30 – 2021-05-10 (×10): 1000 ug
  Filled 2021-04-29 (×10): qty 1

## 2021-04-29 MED ORDER — ACETAMINOPHEN 650 MG RE SUPP
650.0000 mg | Freq: Four times a day (QID) | RECTAL | Status: DC | PRN
  Filled 2021-04-29: qty 1

## 2021-04-29 MED ORDER — SODIUM CHLORIDE 0.9% IV SOLUTION: Freq: Once | INTRAVENOUS | Status: AC

## 2021-04-29 MED ORDER — CHLORHEXIDINE GLUCONATE 0.12 % MT SOLN
15.0000 mL | Freq: Two times a day (BID) | OROMUCOSAL | Status: DC
  Administered 2021-04-29 – 2021-05-08 (×17): 15 mL via OROMUCOSAL
  Filled 2021-04-29 (×17): qty 15

## 2021-04-29 MED ORDER — SEVELAMER CARBONATE 800 MG PO TABS
800.0000 mg | ORAL_TABLET | Freq: Three times a day (TID) | ORAL | Status: DC
  Administered 2021-04-29 – 2021-05-01 (×8): 800 mg
  Filled 2021-04-29 (×10): qty 1

## 2021-04-29 MED ORDER — VITAMIN D 25 MCG (1000 UNIT) PO TABS
1000.0000 [IU] | ORAL_TABLET | Freq: Every day | ORAL | Status: DC
  Administered 2021-04-30 – 2021-05-10 (×10): 1000 [IU]
  Filled 2021-04-29 (×10): qty 1

## 2021-04-29 MED ORDER — ACETAMINOPHEN 325 MG PO TABS
650.0000 mg | ORAL_TABLET | Freq: Four times a day (QID) | ORAL | Status: DC | PRN
  Administered 2021-05-01 – 2021-05-02 (×2): 650 mg
  Filled 2021-04-29 (×2): qty 2

## 2021-04-29 MED ORDER — ATORVASTATIN CALCIUM 20 MG PO TABS
20.0000 mg | ORAL_TABLET | Freq: Every evening | ORAL | Status: DC
  Administered 2021-04-29 – 2021-05-09 (×11): 20 mg
  Filled 2021-04-29 (×12): qty 1

## 2021-04-29 MED ORDER — PROSOURCE TF PO LIQD
45.0000 mL | Freq: Two times a day (BID) | ORAL | Status: DC
  Administered 2021-04-29 – 2021-05-01 (×4): 45 mL
  Filled 2021-04-29 (×7): qty 45

## 2021-04-29 MED ORDER — HEPARIN SODIUM (PORCINE) 1000 UNIT/ML IJ SOLN
1000.0000 [IU] | INTRAMUSCULAR | Status: DC | PRN
Start: 2021-04-29 — End: 2021-05-10
  Administered 2021-04-29: 3000 [IU]
  Administered 2021-05-01: 2800 [IU]
  Administered 2021-05-08: 4200 [IU]
  Filled 2021-04-29 (×3): qty 6

## 2021-04-29 MED ORDER — OSMOLITE 1.5 CAL PO LIQD
1000.0000 mL | ORAL | Status: DC
  Administered 2021-04-29 – 2021-05-01 (×2): 1000 mL

## 2021-04-29 MED ORDER — RENA-VITE PO TABS
1.0000 | ORAL_TABLET | Freq: Every day | ORAL | Status: DC
  Administered 2021-04-29 – 2021-05-09 (×11): 1
  Filled 2021-04-29 (×11): qty 1

## 2021-04-29 MED ORDER — SODIUM CHLORIDE 0.9 % IV SOLN
3.0000 g | Freq: Two times a day (BID) | INTRAVENOUS | Status: DC
  Administered 2021-05-01 – 2021-05-07 (×13): 3 g via INTRAVENOUS
  Filled 2021-04-29 (×5): qty 8
  Filled 2021-04-29 (×2): qty 3
  Filled 2021-04-29 (×2): qty 8
  Filled 2021-04-29: qty 3
  Filled 2021-04-29: qty 8
  Filled 2021-04-29: qty 3
  Filled 2021-04-29: qty 8
  Filled 2021-04-29: qty 3
  Filled 2021-04-29: qty 8
  Filled 2021-04-29 (×3): qty 3
  Filled 2021-04-29: qty 8

## 2021-04-29 MED ORDER — FENTANYL 25 MCG/HR TD PT72
1.0000 | MEDICATED_PATCH | TRANSDERMAL | Status: DC
  Administered 2021-04-29 – 2021-05-08 (×4): 1 via TRANSDERMAL
  Filled 2021-04-29 (×4): qty 1

## 2021-04-29 MED ORDER — JUVEN PO PACK
1.0000 | PACK | Freq: Two times a day (BID) | ORAL | Status: DC
  Administered 2021-04-30 – 2021-05-07 (×8): 1

## 2021-04-29 MED ORDER — HEPARIN SODIUM (PORCINE) 1000 UNIT/ML DIALYSIS
1000.0000 [IU] | INTRAMUSCULAR | Status: DC | PRN
  Filled 2021-04-29: qty 6

## 2021-04-29 MED ORDER — ORAL CARE MOUTH RINSE
15.0000 mL | Freq: Two times a day (BID) | OROMUCOSAL | Status: DC
  Administered 2021-04-29 – 2021-05-10 (×10): 15 mL via OROMUCOSAL

## 2021-04-29 MED ORDER — FREE WATER
30.0000 mL | Status: DC
  Administered 2021-04-29 – 2021-05-01 (×12): 30 mL

## 2021-04-29 MED ORDER — MIDODRINE HCL 5 MG PO TABS
10.0000 mg | ORAL_TABLET | Freq: Three times a day (TID) | ORAL | Status: DC
  Administered 2021-04-29 – 2021-05-07 (×14): 10 mg
  Filled 2021-04-29 (×17): qty 2

## 2021-04-29 NOTE — Progress Notes (Signed)
PHARMACY NOTE:  ANTIMICROBIAL RENAL DOSAGE ADJUSTMENT  Current antimicrobial regimen includes a mismatch between antimicrobial dosage and estimated renal function.  As per policy approved by the Pharmacy & Therapeutics and Medical Executive Committees, the antimicrobial dosage will be adjusted accordingly.  Current antimicrobial dosage: Unasyn 3 g IV q8h  Indication: Acinetobacter bacteremia  Renal Function:  Estimated Creatinine Clearance: 57.1 mL/min (A) (by C-G formula based on SCr of 1.1 mg/dL (H)).    Antimicrobial dosage has been changed to: Unasyn 3 g IV q12h  Additional comments: CRRT stopped. Transitioning back to HD per nephrology.   Thank you for allowing pharmacy to be a part of this patient's care.  Benita Gutter, [facility] 04/29/2021 2:35 PM

## 2021-04-29 NOTE — Progress Notes (Signed)
Date of Admission:  04/25/2021   T ID    Subjective: Patient remains extubated on CRRT.  Confused and delirious.  Medications:   sodium chloride   Intravenous Once   atorvastatin  20 mg Oral QPM   chlorhexidine  15 mL Mouth Rinse BID   Chlorhexidine Gluconate Cloth  6 each Topical Q0600   cholecalciferol  1,000 Units Oral Daily   cholestyramine light  4 g Oral BID   ferrous sulfate  325 mg Oral Daily   heparin  5,000 Units Subcutaneous Q8H   lipase/protease/amylase  36,000 Units Oral TID AC   mouth rinse  15 mL Mouth Rinse q12n4p   megestrol  400 mg Oral Daily   midodrine  10 mg Oral TID WC   sevelamer carbonate  800 mg Oral TID WC & HS   silver sulfADIAZINE   Topical BID   cyanocobalamin  1,000 mcg Oral Daily    Objective: Vital signs in last 24 hours: Temp:  [93.38 F (34.1 C)-97.5 F (36.4 C)] 97.5 F (36.4 C) (08/08 0800) Pulse Rate:  [59-76] 70 (08/08 0900) Resp:  [5-21] 14 (08/08 0900) BP: (111-143)/(44-88) 135/58 (08/08 0900) SpO2:  [94 %-100 %] 100 % (08/08 0900) FiO2 (%):  [28 %-30 %] 28 % (08/08 0900) Weight:  [83.1 kg] 83.1 kg (08/08 0454)  PHYSICAL EXAM:  General: Chronically and critically ill, pale, not oriented head: Normocephalic, without obvious abnormality, atraumatic. Eyes: Conjunctivae clear, anicteric sclerae. Pupils are equal ENT cannot examine. Neck: , Right IJ temporary dialysis catheter symmetrical, no adenopathy, thyroid: non tender no carotid bruit and no JVD. Back: Did not examine Lungs: Bilateral air entry.  Crypts in the bases. Heart: Regular rate and rhythm, no murmur, rub or gallop. Abdomen: Soft, non-tender, edema of the abdominal wall Extremities: Edema legs Skin: No rashes or lesions. Or bruising Lymph: Cervical, supraclavicular normal. Neurologic: Grossly non-focal  Lab Results Recent Labs    04/28/21 0447 04/28/21 1718 04/29/21 0411  WBC 10.2  --  6.2  HGB 7.0* 7.5* 7.1*  HCT 21.4* 23.7* 21.9*  NA 133* 133* 132*  K  2.8* 3.1* 3.1*  CL 101 101 101  CO2 27 27 28   BUN 6* 6* 6*  CREATININE 1.27* 1.15* 1.10*   Liver Panel Recent Labs    04/28/21 1718 04/29/21 0411  PROT  --  6.0*  ALBUMIN 2.9* 2.9*  2.6*  AST  --  16  ALT  --  13  ALKPHOS  --  140*  BILITOT  --  0.6  BILIDIR  --  0.2  IBILI  --  0.4   Sedimentation Rate No results for input(s): ESRSEDRATE in the last 72 hours. C-Reactive Protein No results for input(s): CRP in the last 72 hours.  Microbiology:  Studies/Results: ECHOCARDIOGRAM COMPLETE  Result Date: 04/28/2021    ECHOCARDIOGRAM REPORT   Patient Name:   [name] Date of Exam: 04/28/2021 Medical Rec #:  [id number]   Height:       65.0 in Accession #:    [id number]  Weight:       183.6 lb Date of Birth:  1961/05/16    BSA:          1.908 m Patient Age:    60 years    BP:           122/43 mmHg Patient Gender: F           HR:  58 bpm. Exam Location:  [facility] Procedure: 2D Echo, Color Doppler and Cardiac Doppler Indications:     I50.31 congestive heart failure-Acute Diastolic  History:         Patient has prior history of Echocardiogram examinations, most                  recent 06/11/2020. CAD, ESRD and COPD; Risk Factors:Hypertension                  and Diabetes.  Sonographer:     Charmayne Sheer RDCS (AE) Referring Phys:  [id number] Ottie Glazier Diagnosing Phys: Serafina Royals MD IMPRESSIONS  1. Left ventricular ejection fraction, by estimation, is 55 to 60%. The left ventricle has normal function. The left ventricle has no regional wall motion abnormalities. Left ventricular diastolic parameters were normal.  2. Right ventricular systolic function is normal. The right ventricular size is normal.  3. Left atrial size was mildly dilated.  4. Right atrial size was mildly dilated.  5. The mitral valve is normal in structure. Mild to moderate mitral valve regurgitation.  6. Tricuspid valve regurgitation is moderate.  7. The aortic valve is normal in structure. Aortic valve regurgitation is  trivial. FINDINGS  Left Ventricle: Left ventricular ejection fraction, by estimation, is 55 to 60%. The left ventricle has normal function. The left ventricle has no regional wall motion abnormalities. The left ventricular internal cavity size was normal in size. There is  no left ventricular hypertrophy. Left ventricular diastolic parameters were normal. Right Ventricle: The right ventricular size is normal. No increase in right ventricular wall thickness. Right ventricular systolic function is normal. Left Atrium: Left atrial size was mildly dilated. Right Atrium: Right atrial size was mildly dilated. Pericardium: There is no evidence of pericardial effusion. Mitral Valve: The mitral valve is normal in structure. Mild to moderate mitral valve regurgitation. MV peak gradient, 6.7 mmHg. The mean mitral valve gradient is 3.0 mmHg. Tricuspid Valve: The tricuspid valve is normal in structure. Tricuspid valve regurgitation is moderate. Aortic Valve: The aortic valve is normal in structure. Aortic valve regurgitation is trivial. Aortic valve mean gradient measures 5.0 mmHg. Aortic valve peak gradient measures 9.9 mmHg. Aortic valve area, by VTI measures 1.57 cm. Pulmonic Valve: The pulmonic valve was normal in structure. Pulmonic valve regurgitation is mild. Aorta: The aortic root and ascending aorta are structurally normal, with no evidence of dilitation. IAS/Shunts: No atrial level shunt detected by color flow Doppler.  LEFT VENTRICLE PLAX 2D LVIDd:         4.80 cm  Diastology LVIDs:         2.90 cm  LV e' medial:    6.31 cm/s LV PW:         1.00 cm  LV E/e' medial:  17.9 LV IVS:        0.60 cm  LV e' lateral:   5.33 cm/s LVOT diam:     1.60 cm  LV E/e' lateral: 21.2 LV SV:         62 LV SV Index:   33 LVOT Area:     2.01 cm  RIGHT VENTRICLE RV Basal diam:  5.20 cm RV Mid diam:    4.30 cm RV S prime:     13.50 cm/s TAPSE (M-mode): 3.1 cm LEFT ATRIUM             Index       RIGHT ATRIUM           Index LA  diam:         4.80 cm 2.52 cm/m  RA Area:     16.80 cm LA Vol (A2C):   50.1 ml 26.26 ml/m RA Volume:   44.40 ml  23.27 ml/m LA Vol (A4C):   66.6 ml 34.91 ml/m LA Biplane Vol: 61.6 ml 32.29 ml/m  AORTIC VALVE                    PULMONIC VALVE AV Area (Vmax):    1.52 cm     PV Vmax:          1.19 m/s AV Area (Vmean):   1.59 cm     PV Vmean:         79.500 cm/s AV Area (VTI):     1.57 cm     PV VTI:           0.289 m AV Vmax:           157.00 cm/s  PV Peak grad:     5.7 mmHg AV Vmean:          110.000 cm/s PV Mean grad:     3.0 mmHg AV VTI:            0.396 m      PR End Diast Vel: 5.57 msec AV Peak Grad:      9.9 mmHg AV Mean Grad:      5.0 mmHg LVOT Vmax:         119.00 cm/s LVOT Vmean:        86.900 cm/s LVOT VTI:          0.310 m LVOT/AV VTI ratio: 0.78  AORTA Ao Root diam: 2.70 cm MITRAL VALVE                TRICUSPID VALVE MV Area (PHT): 3.23 cm     TR Peak grad:   55.1 mmHg MV Area VTI:   1.51 cm     TR Vmax:        371.00 cm/s MV Peak grad:  6.7 mmHg MV Mean grad:  3.0 mmHg     SHUNTS MV Vmax:       1.29 m/s     Systemic VTI:  0.31 m MV Vmean:      70.7 cm/s    Systemic Diam: 1.60 cm MV Decel Time: 235 msec MV E velocity: 113.00 cm/s MV A velocity: 79.70 cm/s MV E/A ratio:  1.42 Serafina Royals MD Electronically signed by Serafina Royals MD Signature Date/Time: 04/28/2021/7:33:07 PM    Final    US Abdomen Limited RUQ (LIVER/GB)  Result Date: 04/28/2021 CLINICAL DATA:  Right upper quadrant pain EXAM: ULTRASOUND ABDOMEN LIMITED RIGHT UPPER QUADRANT COMPARISON:  CT 04/25/2021 FINDINGS: Gallbladder: Surgically absent Common bile duct: Diameter: 5.9 mm Liver: Coarse hepatic echotexture with possible contour nodularity. No focal hepatic abnormality. Portal vein is patent on color Doppler imaging with normal direction of blood flow towards the liver. Other: Small amount of perihepatic ascites.  Atrophic right kidney. IMPRESSION: 1. Status post cholecystectomy.  No biliary dilatation 2. Coarse hepatic echotexture with  questionable nodularity suggesting underlying hepatic parenchymal disease and potential early cirrhosis changes. Small amount of perihepatic ascites. Electronically Signed   By: Donavan Foil M.D.   On: 04/28/2021 22:36     Assessment/Plan: Acinetobacter bacteremia.  Likely related to the dialysis catheter which has been removed.  Acinetobacter is highly susceptible to Unasyn does not have any resistance.  Hence we will discontinue  the Arava cyclin and continue Unasyn.  Sepsis with hypotension: Improved Encephalopathy End-stage renal disease getting CRRT  Coronary artery disease EXTR congestive heart failure  Severe hypoalbuminemia as  Anemia of kidney disease  Discussed the management with the care team.

## 2021-04-29 NOTE — Progress Notes (Signed)
SLP Cancellation Note  Patient Details Name: [name] MRN: [id number] DOB: [birth year]   Cancelled treatment:       Reason Eval/Treat Not Completed: Medical issues which prohibited therapy  Pt too lethargic for safe PO intake at this time. Pt medicated d/t persistent yelling overnight.    Tuck Dulworth B. Rutherford Nail M.S., CCC-SLP, [place] Office [phone]  Jeffry Vogelsang Rutherford Nail 04/29/2021, 9:20 AM

## 2021-04-29 NOTE — Progress Notes (Signed)
Patient hollering out during bedside report. Seemingly responds to her name when called but does not follow any other commands. All VSS. Tube feed currently infusing. VSS. Precedex restarted at 0.19mcg by previous shift nurse. RT requested for PRN breathing treatment for expiratory wheezes. Will continue to monitor.

## 2021-04-29 NOTE — Progress Notes (Addendum)
Initial Nutrition Assessment  DOCUMENTATION CODES:   Obesity unspecified  INTERVENTION:   Osmolite 1.5 _0 /hr- Initiate at 74m/hr and increase by 14mhr q 8 hours until goal rate is reached.   Pro-Source 4564mID via tube, provides 40kcal and 11g of protein per serving   Free water flushes 65m21m hours to maintain tube patency   Regimen provides 2060kcal/day, 105g/day protein and 1185ml52m free water   Rena-vit daily via tube   Juven Fruit Punch BID via tube, each serving provides 95kcal and 2.5g of protein (amino acids glutamine and arginine)  Pt at high refeed risk; recommend monitor potassium, magnesium and phosphorus labs daily until stable  NUTRITION DIAGNOSIS:   Inadequate oral intake related to acute illness as evidenced by NPO status.  GOAL:   Patient will meet greater than or equal to 90% of their needs  MONITOR:   Labs, Diet advancement, Weight trends, TF tolerance, I & O's, Skin  REASON FOR ASSESSMENT:   Consult Enteral/tube feeding initiation and management  ASSESSMENT:   59 y/47female with h/o CHF, depression, anxiety, DM, COPD, ESRD on HD, HOH, MI, hiatal hernia, gastroparesis and renal cancer who is admitted with septic shock and bacteremia secondary to central line.  Met with pt in room today. Pt unable to provide any nutrition related history r/t AMS. Pt is well known to this RD from previous admits. Pt with good appetite and oral intake at baseline. Pt does experience nausea after eating occasionally secondary to her gastroparesis. Pt is generally compliant with supplements (prefers strawberry and butter pecan). Of note, pt does wear dentures. Pt extubated yesterday. CRRT to stop today. Pt with AMS today and is unable to be placed on an oral diet. NGT placed today; will plan to initiate tube feeds. Pt refeeding currently secondary to IV dextrose; pharmacy managing electrolytes. Per chart, pt appears weight stable at baseline.   Medications reviewed  and include: D3 heparin, creon, renvela, B12, unasyn, 10% dextrose _1 /hr, Kphos  Labs reviewed: Na 132(L), K 3.1(L), BUN 6(L), creat 1.10(H), P 2.0(L), Mg 1.9 wnl Hgb 7.1(L), Hct 21.9(L) Cbgs- 134, 128, 125 x 24 hrs AIC 6.2(H)- 7/8  NUTRITION - FOCUSED PHYSICAL EXAM:  Flowsheet Row Most Recent Value  Orbital Region No depletion  Upper Arm Region No depletion  Thoracic and Lumbar Region No depletion  Buccal Region No depletion  Temple Region No depletion  Clavicle Bone Region Mild depletion  Clavicle and Acromion Bone Region Mild depletion  Scapular Bone Region No depletion  Dorsal Hand No depletion  Patellar Region Mild depletion  Anterior Thigh Region No depletion  Posterior Calf Region No depletion  Edema (RD Assessment) Moderate  Hair Reviewed  Eyes Reviewed  Mouth Reviewed  Skin Reviewed  Nails Reviewed   Diet Order:   Diet Order             Diet NPO time specified  Diet effective now                  EDUCATION NEEDS:   No education needs have been identified at this time  Skin:  Skin Assessment: Reviewed RN Assessment (Bilateral foot wounds, Stage II sacrum)  Last BM:  8/6- type 6  Height:   Ht Readings from Last 1 Encounters:  04/27/21 _2  (1.651 m)    Weight:   Wt Readings from Last 1 Encounters:  04/29/21 83.1 kg    Ideal Body Weight:  56.8 kg  BMI:  Body mass index is 30.49 kg/m.  Estimated Nutritional Needs:   Kcal:  1800-2100kcal/day  Protein:  90-105g/day  Fluid:  UOP +1L  Koleen Distance MS, RD, LDN Please refer to [facility] for RD and/or RD on-call/weekend/after hours pager

## 2021-04-29 NOTE — Plan of Care (Signed)

## 2021-04-29 NOTE — Progress Notes (Signed)
25 mcg of Fentanyl given as per discussion with Dr Duwayne Heck for pain.

## 2021-04-29 NOTE — Progress Notes (Signed)
[facility]  ROUNDING NOTE   Subjective:   [name] was admitted to [facility] on 04/25/2021 for Sepsis [place] [facility]) [A41.9] Severe sepsis ([place]) [A41.9, R65.20] Fever in other diseases [R50.81]  Remains critically ill Off pressors now Extubated 04/28/2021 CRRT continued.  Potassium noted to be low  Patient is delirious and is calling out names which are hard to understand   Objective:  Vital signs in last 24 hours:  Temp:  [93.38 F (34.1 C)-97.9 F (36.6 C)] 97.9 F (36.6 C) (08/08 1220) Pulse Rate:  [59-77] 65 (08/08 1300) Resp:  [5-18] 12 (08/08 1300) BP: (111-148)/(44-79) 126/61 (08/08 1300) SpO2:  [100 %] 100 % (08/08 1300) FiO2 (%):  [28 %-36 %] 36 % (08/08 1220) Weight:  [83.1 kg] 83.1 kg (08/08 0454)  Weight change: -0.2 kg Filed Weights   04/27/21 0418 04/28/21 0451 04/29/21 0454  Weight: 82.2 kg 83.3 kg 83.1 kg    Intake/Output: I/O last 3 completed shifts: In: 4855.9 [I.V.:2915.5; Other:47.5; NG/GT:180; IV Piggyback:1712.9] Out: 6656 [Other:6656]   Intake/Output this shift:  Total I/O In: 851.2 [I.V.:318.1; Blood:401.5; IV Piggyback:131.6] Out: 1080 [Other:1080]  Physical Exam: General: Critically ill appearing  Head: Normocephalic, atraumatic. Moist oral mucosal membranes  Lungs:  Coarse breath sounds  Heart: Regular rate and rhythm  Abdomen:  Soft, nontender,   Extremities: ++ peripheral edema.  Neurologic: not following commands, delirious  Skin: No lesions  Access: Right IJ temporary dialysis catheter    Basic Metabolic Panel: Recent Labs  Lab 04/25/21 1651 04/25/21 1838 04/26/21 0415 04/26/21 1932 04/27/21 0417 04/27/21 1608 04/28/21 0447 04/28/21 1718 04/29/21 0411  NA  --   --  131*  --  131* 133* 133* 133* 132*  K  --   --  5.0  --  4.0 3.8 2.8* 3.1* 3.1*  CL  --   --  100  --  100 100 101 101 101  CO2  --   --  24  --  26 25 27 27 28   GLUCOSE  --   --  103*  --  119* 106* 114* 155* 145*  BUN  --   --  18  --  14 10 6*  6* 6*  CREATININE  --   --  4.04*  --  2.68* 2.04* 1.27* 1.15* 1.10*  CALCIUM  --   --  7.0*  --  8.1* 8.3* 8.5* 8.8* 8.3*  MG  --  1.7 1.6* 1.6*  --   --  1.7  --  1.9  PHOS   < >  --  3.9  --  2.5 2.0* 1.5* 2.1* 2.0*   < > = values in this interval not displayed.     Liver Function Tests: Recent Labs  Lab 04/25/21 1651 04/26/21 0415 04/27/21 0417 04/27/21 1608 04/28/21 0447 04/28/21 1718 04/29/21 0411  AST 23 23  --   --   --   --  16  ALT 14 11  --   --   --   --  13  ALKPHOS 137* 100  --   --   --   --  140*  BILITOT 0.5 0.8  --   --   --   --  0.6  PROT 7.6 5.5*  --   --   --   --  6.0*  ALBUMIN 2.3* 1.6* 2.3* 2.2* 2.3* 2.9* 2.9*  2.6*    Recent Labs  Lab 04/25/21 1651  LIPASE 20    Recent  Labs  Lab 04/26/21 0415  AMMONIA 55*     CBC: Recent Labs  Lab 04/25/21 1651 04/26/21 0415 04/27/21 0417 04/28/21 0447 04/28/21 1718 04/29/21 0411 04/29/21 1318  WBC 5.7 4.1 11.3* 10.2  --  6.2  --   NEUTROABS 4.5 3.4  --   --   --   --   --   HGB 9.1* 8.1* 7.7* 7.0* 7.5* 7.1* 8.6*  HCT 28.8* 25.5* 24.1* 21.4* 23.7* 21.9* 26.6*  MCV 89.7 91.1 88.3 87.3  --  90.1  --   PLT 245 188 149* 140*  --  115*  --      Cardiac Enzymes: No results for input(s): CKTOTAL, CKMB, CKMBINDEX, TROPONINI in the last 168 hours.  BNP: Invalid input(s): POCBNP  CBG: Recent Labs  Lab 04/28/21 1914 04/28/21 2339 04/29/21 0354 04/29/21 0724 04/29/21 1111  GLUCAP 168* 156* 134* 128* 125*     Microbiology: Results for orders placed or performed during the hospital encounter of 04/25/21  Culture, blood (Routine x 2)     Status: Abnormal   Collection Time: 04/25/21  4:51 PM   Specimen: BLOOD  Result Value Ref Range Status   Specimen Description   Final    BLOOD LW Performed at [facility], [address], [place], [place] [address]    Special Requests   Final    BOTTLES DRAWN AEROBIC AND ANAEROBIC Blood Culture adequate volume Performed at [facility], [address] [place], [place] [address]    Culture  Setup Time   Final    GRAM VARIABLE COCCOBACILLI AEROBIC BOTTLE ONLY Organism ID to follow CRITICAL RESULT CALLED TO, READ BACK BY AND VERIFIED WITH: [facility] AT 9476 04/26/21.PMF Performed at [facility], [address] [place], [place] [address]    Culture ACINETOBACTER CALCOACETICUS/BAUMANNII COMPLEX (A)  Final   Report Status 04/28/2021 FINAL  Final   Organism ID, Bacteria ACINETOBACTER CALCOACETICUS/BAUMANNII COMPLEX  Final      Susceptibility   Acinetobacter calcoaceticus/baumannii complex - MIC*    CEFTAZIDIME 16 INTERMEDIATE Intermediate     CIPROFLOXACIN <=0.25 SENSITIVE Sensitive     GENTAMICIN <=1 SENSITIVE Sensitive     IMIPENEM <=0.25 SENSITIVE Sensitive     PIP/TAZO 32 INTERMEDIATE Intermediate     TRIMETH/SULFA <=20 SENSITIVE Sensitive     AMPICILLIN/SULBACTAM <=2 SENSITIVE Sensitive     * ACINETOBACTER CALCOACETICUS/BAUMANNII COMPLEX  Blood Culture ID Panel (Reflexed)     Status: Abnormal   Collection Time: 04/25/21  4:51 PM  Result Value Ref Range Status   Enterococcus faecalis NOT DETECTED NOT DETECTED Final   Enterococcus Faecium NOT DETECTED NOT DETECTED Final   Listeria monocytogenes NOT DETECTED NOT DETECTED Final   Staphylococcus species NOT DETECTED NOT DETECTED Final   Staphylococcus aureus (BCID) NOT DETECTED NOT DETECTED Final   Staphylococcus epidermidis NOT DETECTED NOT DETECTED Final   Staphylococcus lugdunensis NOT DETECTED NOT DETECTED Final   Streptococcus species NOT DETECTED NOT DETECTED Final   Streptococcus agalactiae NOT DETECTED NOT DETECTED Final   Streptococcus pneumoniae NOT DETECTED NOT DETECTED Final   Streptococcus pyogenes NOT DETECTED NOT DETECTED Final   A.calcoaceticus-baumannii DETECTED (A) NOT DETECTED Final    Comment: CRITICAL RESULT CALLED TO, READ BACK BY AND VERIFIED WITH: CHARLES SHANLEVER AT 1014 04/26/21.PMF    Bacteroides fragilis  NOT DETECTED NOT DETECTED Final   Enterobacterales NOT DETECTED NOT DETECTED Final   Enterobacter cloacae complex NOT DETECTED NOT DETECTED Final   Escherichia coli NOT DETECTED  NOT DETECTED Final   Klebsiella aerogenes NOT DETECTED NOT DETECTED Final   Klebsiella oxytoca NOT DETECTED NOT DETECTED Final   Klebsiella pneumoniae NOT DETECTED NOT DETECTED Final   Proteus species NOT DETECTED NOT DETECTED Final   Salmonella species NOT DETECTED NOT DETECTED Final   Serratia marcescens NOT DETECTED NOT DETECTED Final   Haemophilus influenzae NOT DETECTED NOT DETECTED Final   Neisseria meningitidis NOT DETECTED NOT DETECTED Final   Pseudomonas aeruginosa NOT DETECTED NOT DETECTED Final   Stenotrophomonas maltophilia NOT DETECTED NOT DETECTED Final   Candida albicans NOT DETECTED NOT DETECTED Final   Candida auris NOT DETECTED NOT DETECTED Final   Candida glabrata NOT DETECTED NOT DETECTED Final   Candida krusei NOT DETECTED NOT DETECTED Final   Candida parapsilosis NOT DETECTED NOT DETECTED Final   Candida tropicalis NOT DETECTED NOT DETECTED Final   Cryptococcus neoformans/gattii NOT DETECTED NOT DETECTED Final   CTX-M ESBL NOT DETECTED NOT DETECTED Final   Carbapenem resistance IMP NOT DETECTED NOT DETECTED Final   Carbapenem resistance KPC NOT DETECTED NOT DETECTED Final   Carbapenem resistance NDM NOT DETECTED NOT DETECTED Final   Carbapenem resistance VIM NOT DETECTED NOT DETECTED Final    Comment: Performed at [facility], [address] [place], [place] [address]  Culture, blood (Routine x 2)     Status: Abnormal   Collection Time: 04/25/21  6:38 PM   Specimen: BLOOD  Result Value Ref Range Status   Specimen Description   Final    BLOOD BLOOD LEFT WRIST Performed at [facility], [address], [place], [place] [address]    Special Requests   Final    BOTTLES DRAWN AEROBIC AND ANAEROBIC Blood Culture adequate volume Performed at [facility], [address] [place], [place] [address]    Culture  Setup Time   Final    GRAM VARIABLE COCCOBACILLI AEROBIC BOTTLE ONLY CRITICAL VALUE NOTED.  VALUE IS CONSISTENT WITH PREVIOUSLY REPORTED AND CALLED VALUE. Performed at [facility], [address] [place], [place] [address]    Culture (A)  Final    ACINETOBACTER CALCOACETICUS/BAUMANNII COMPLEX SUSCEPTIBILITIES PERFORMED ON PREVIOUS CULTURE WITHIN THE LAST 5 DAYS. Performed at [facility], [place] [address], [place], [place] [address]    Report Status 04/28/2021 FINAL  Final  Resp Panel by RT-PCR (Flu A&B, Covid) Nasopharyngeal Swab     Status: None   Collection Time: 04/25/21  6:54 PM   Specimen: Nasopharyngeal Swab; Nasopharyngeal(NP) swabs in vial transport medium  Result Value Ref Range Status   SARS Coronavirus 2 by RT PCR NEGATIVE NEGATIVE Final    Comment: (NOTE) SARS-CoV-2 target nucleic acids are NOT DETECTED.  The SARS-CoV-2 RNA is generally detectable in upper respiratory specimens during the acute phase of infection. The lowest concentration of SARS-CoV-2 viral copies this assay can detect is 138 copies/mL. A negative result does not preclude SARS-Cov-2 infection and should not be used as the sole basis for treatment or other patient management decisions. A negative result may occur with  improper specimen collection/handling, submission of specimen other than nasopharyngeal swab, presence of viral mutation(s) within the areas targeted by this assay, and inadequate number of viral copies(<138 copies/mL). A negative result must be combined with clinical observations, patient history, and epidemiological information. The expected result is Negative.  Fact Sheet for Patients:  EntrepreneurPulse.com.au  Fact Sheet for Healthcare Providers:  IncredibleEmployment.be  This test is no t yet approved or cleared by the [place]  States FDA and  has been authorized for detection  and/or diagnosis of SARS-CoV-2 by FDA under an Emergency Use Authorization (EUA). This EUA will remain  in effect (meaning this test can be used) for the duration of the COVID-19 declaration under Section 564(b)(1) of the Act, 21 U.S.C.section 360bbb-3(b)(1), unless the authorization is terminated  or revoked sooner.       Influenza A by PCR NEGATIVE NEGATIVE Final   Influenza B by PCR NEGATIVE NEGATIVE Final    Comment: (NOTE) The Xpert Xpress SARS-CoV-2/FLU/RSV plus assay is intended as an aid in the diagnosis of influenza from Nasopharyngeal swab specimens and should not be used as a sole basis for treatment. Nasal washings and aspirates are unacceptable for Xpert Xpress SARS-CoV-2/FLU/RSV testing.  Fact Sheet for Patients: EntrepreneurPulse.com.au  Fact Sheet for Healthcare Providers: IncredibleEmployment.be  This test is not yet approved or cleared by the [place] FDA and has been authorized for detection and/or diagnosis of SARS-CoV-2 by FDA under an Emergency Use Authorization (EUA). This EUA will remain in effect (meaning this test can be used) for the duration of the COVID-19 declaration under Section 564(b)(1) of the Act, 21 U.S.C. section 360bbb-3(b)(1), unless the authorization is terminated or revoked.  Performed at [facility], [address] [place], [place] [address]   MRSA Next Gen by PCR, Nasal     Status: None   Collection Time: 04/26/21  6:35 PM   Specimen: Nasal Mucosa; Nasal Swab  Result Value Ref Range Status   MRSA by PCR Next Gen NOT DETECTED NOT DETECTED Final    Comment: (NOTE) The GeneXpert MRSA Assay (FDA approved for NASAL specimens only), is one component of a comprehensive MRSA colonization surveillance program. It is not intended to diagnose MRSA infection nor to guide or monitor treatment for MRSA infections. Test performance is not FDA approved in patients less than 3  years old. Performed at [facility], [address], [place], [place] [address]   Cath Tip Culture     Status: None (Preliminary result)   Collection Time: 04/27/21 11:59 AM   Specimen: Catheter Tip; Other  Result Value Ref Range Status   Specimen Description   Final    CATH TIP Performed at [facility], [address] [place], [place] [address]    Special Requests   Final    NONE Performed at [facility], [address], [place], [place] [address]    Culture   Final    NO GROWTH 2 DAYS Performed at [facility], [place] [address] [place], [place] [address]    Report Status PENDING  Incomplete  CULTURE, BLOOD (ROUTINE X 2) w Reflex to ID Panel     Status: None (Preliminary result)   Collection Time: 04/28/21 11:34 AM   Specimen: BLOOD  Result Value Ref Range Status   Specimen Description BLOOD RIGHT AC  Final   Special Requests   Final    BOTTLES DRAWN AEROBIC ONLY Blood Culture results may not be optimal due to an inadequate volume of blood received in culture bottles   Culture   Final    NO GROWTH < 24 HOURS Performed at [facility], [address] [place], [place] [address]    Report Status PENDING  Incomplete  CULTURE, BLOOD (ROUTINE X 2) w Reflex to ID Panel     Status: None (Preliminary result)   Collection Time: 04/28/21  5:27 PM   Specimen: BLOOD  Result Value Ref Range  Status   Specimen Description BLOOD LT FINGER IN HAND  Final   Special Requests BOTTLES DRAWN AEROBIC ONLY  BCAV  Final   Culture   Final    NO GROWTH < 12 HOURS Performed at [facility], [address] [place], [place] [address]    Report Status PENDING  Incomplete    Coagulation Studies: No results for input(s): LABPROT, INR in the last 72 hours.   Urinalysis: No results for input(s): COLORURINE, LABSPEC, PHURINE, GLUCOSEU, HGBUR, BILIRUBINUR, KETONESUR, PROTEINUR, UROBILINOGEN, NITRITE, LEUKOCYTESUR in the last 72  hours.  Invalid input(s): APPERANCEUR    Imaging: ECHOCARDIOGRAM COMPLETE  Result Date: 04/28/2021    ECHOCARDIOGRAM REPORT   Patient Name:   [name] Date of Exam: 04/28/2021 Medical Rec #:  [id number]   Height:       65.0 in Accession #:    [id number]  Weight:       183.6 lb Date of Birth:  1961/03/01    BSA:          1.908 m Patient Age:    10 years    BP:           122/43 mmHg Patient Gender: F           HR:           58 bpm. Exam Location:  [facility] Procedure: 2D Echo, Color Doppler and Cardiac Doppler Indications:     I50.31 congestive heart failure-Acute Diastolic  History:         Patient has prior history of Echocardiogram examinations, most                  recent 06/11/2020. CAD, ESRD and COPD; Risk Factors:Hypertension                  and Diabetes.  Sonographer:     Charmayne Sheer RDCS (AE) Referring Phys:  [id number] Ottie Glazier Diagnosing Phys: Serafina Royals MD IMPRESSIONS  1. Left ventricular ejection fraction, by estimation, is 55 to 60%. The left ventricle has normal function. The left ventricle has no regional wall motion abnormalities. Left ventricular diastolic parameters were normal.  2. Right ventricular systolic function is normal. The right ventricular size is normal.  3. Left atrial size was mildly dilated.  4. Right atrial size was mildly dilated.  5. The mitral valve is normal in structure. Mild to moderate mitral valve regurgitation.  6. Tricuspid valve regurgitation is moderate.  7. The aortic valve is normal in structure. Aortic valve regurgitation is trivial. FINDINGS  Left Ventricle: Left ventricular ejection fraction, by estimation, is 55 to 60%. The left ventricle has normal function. The left ventricle has no regional wall motion abnormalities. The left ventricular internal cavity size was normal in size. There is  no left ventricular hypertrophy. Left ventricular diastolic parameters were normal. Right Ventricle: The right ventricular size is normal. No increase in right  ventricular wall thickness. Right ventricular systolic function is normal. Left Atrium: Left atrial size was mildly dilated. Right Atrium: Right atrial size was mildly dilated. Pericardium: There is no evidence of pericardial effusion. Mitral Valve: The mitral valve is normal in structure. Mild to moderate mitral valve regurgitation. MV peak gradient, 6.7 mmHg. The mean mitral valve gradient is 3.0 mmHg. Tricuspid Valve: The tricuspid valve is normal in structure. Tricuspid valve regurgitation is moderate. Aortic Valve: The aortic valve is normal in structure. Aortic valve regurgitation is trivial. Aortic valve mean gradient measures 5.0 mmHg. Aortic valve peak  gradient measures 9.9 mmHg. Aortic valve area, by VTI measures 1.57 cm. Pulmonic Valve: The pulmonic valve was normal in structure. Pulmonic valve regurgitation is mild. Aorta: The aortic root and ascending aorta are structurally normal, with no evidence of dilitation. IAS/Shunts: No atrial level shunt detected by color flow Doppler.  LEFT VENTRICLE PLAX 2D LVIDd:         4.80 cm  Diastology LVIDs:         2.90 cm  LV e' medial:    6.31 cm/s LV PW:         1.00 cm  LV E/e' medial:  17.9 LV IVS:        0.60 cm  LV e' lateral:   5.33 cm/s LVOT diam:     1.60 cm  LV E/e' lateral: 21.2 LV SV:         62 LV SV Index:   33 LVOT Area:     2.01 cm  RIGHT VENTRICLE RV Basal diam:  5.20 cm RV Mid diam:    4.30 cm RV S prime:     13.50 cm/s TAPSE (M-mode): 3.1 cm LEFT ATRIUM             Index       RIGHT ATRIUM           Index LA diam:        4.80 cm 2.52 cm/m  RA Area:     16.80 cm LA Vol (A2C):   50.1 ml 26.26 ml/m RA Volume:   44.40 ml  23.27 ml/m LA Vol (A4C):   66.6 ml 34.91 ml/m LA Biplane Vol: 61.6 ml 32.29 ml/m  AORTIC VALVE                    PULMONIC VALVE AV Area (Vmax):    1.52 cm     PV Vmax:          1.19 m/s AV Area (Vmean):   1.59 cm     PV Vmean:         79.500 cm/s AV Area (VTI):     1.57 cm     PV VTI:           0.289 m AV Vmax:            157.00 cm/s  PV Peak grad:     5.7 mmHg AV Vmean:          110.000 cm/s PV Mean grad:     3.0 mmHg AV VTI:            0.396 m      PR End Diast Vel: 5.57 msec AV Peak Grad:      9.9 mmHg AV Mean Grad:      5.0 mmHg LVOT Vmax:         119.00 cm/s LVOT Vmean:        86.900 cm/s LVOT VTI:          0.310 m LVOT/AV VTI ratio: 0.78  AORTA Ao Root diam: 2.70 cm MITRAL VALVE                TRICUSPID VALVE MV Area (PHT): 3.23 cm     TR Peak grad:   55.1 mmHg MV Area VTI:   1.51 cm     TR Vmax:        371.00 cm/s MV Peak grad:  6.7 mmHg MV Mean grad:  3.0 mmHg     SHUNTS MV Vmax:  1.29 m/s     Systemic VTI:  0.31 m MV Vmean:      70.7 cm/s    Systemic Diam: 1.60 cm MV Decel Time: 235 msec MV E velocity: 113.00 cm/s MV A velocity: 79.70 cm/s MV E/A ratio:  1.42 Serafina Royals MD Electronically signed by Serafina Royals MD Signature Date/Time: 04/28/2021/7:33:07 PM    Final    US Abdomen Limited RUQ (LIVER/GB)  Result Date: 04/28/2021 CLINICAL DATA:  Right upper quadrant pain EXAM: ULTRASOUND ABDOMEN LIMITED RIGHT UPPER QUADRANT COMPARISON:  CT 04/25/2021 FINDINGS: Gallbladder: Surgically absent Common bile duct: Diameter: 5.9 mm Liver: Coarse hepatic echotexture with possible contour nodularity. No focal hepatic abnormality. Portal vein is patent on color Doppler imaging with normal direction of blood flow towards the liver. Other: Small amount of perihepatic ascites.  Atrophic right kidney. IMPRESSION: 1. Status post cholecystectomy.  No biliary dilatation 2. Coarse hepatic echotexture with questionable nodularity suggesting underlying hepatic parenchymal disease and potential early cirrhosis changes. Small amount of perihepatic ascites. Electronically Signed   By: Donavan Foil M.D.   On: 04/28/2021 22:36     Medications:    [START ON 04/30/2021] ampicillin-sulbactam (UNASYN) IV     dexmedetomidine (PRECEDEX) IV infusion 0.5 mcg/kg/hr (04/29/21 1300)   dextrose Stopped (04/29/21 1147)   feeding supplement  (OSMOLITE 1.5 CAL) 1,000 mL (04/29/21 1252)   potassium PHOSPHATE IVPB (in mmol) 43 mL/hr at 04/29/21 1300    atorvastatin  20 mg Per Tube QPM   chlorhexidine  15 mL Mouth Rinse BID   Chlorhexidine Gluconate Cloth  6 each Topical Q0600   [START ON 04/30/2021] cholecalciferol  1,000 Units Per Tube Daily   feeding supplement (PROSource TF)  45 mL Per Tube BID   fentaNYL  1 patch Transdermal Q72H   free water  30 mL Per Tube Q4H   heparin  5,000 Units Subcutaneous Q8H   lipase/protease/amylase  36,000 Units Oral TID AC   mouth rinse  15 mL Mouth Rinse q12n4p   midodrine  10 mg Per Tube TID WC   multivitamin  1 tablet Per Tube QHS   [START ON 04/30/2021] nutrition supplement (JUVEN)  1 packet Per Tube BID BM   sevelamer carbonate  800 mg Per Tube TID WC & HS   silver sulfADIAZINE   Topical BID   [START ON 04/30/2021] cyanocobalamin  1,000 mcg Per Tube Daily   acetaminophen **OR** acetaminophen, albuterol, dextrose, docusate sodium, fentaNYL (SUBLIMAZE) injection, heparin sodium (porcine), polyethylene glycol  Assessment/ Plan:  [name] is a 60 y.o. black female with end stage renal disease on hemodialysis for greater than 14 years, hypertension, congestive heart failure, coronary artery disease, diabetes mellitus type II, who is admitted to [facility] on 04/25/2021 for Sepsis ([place]) [A41.9] Severe sepsis ([place]) [A41.9, R65.20] Fever in other diseases [R50.81]   [facility] MWF Lt Permcath 74kg   End Stage Renal Disease on dialysis: CRRT to be discontinued today; patient is off pressors. UF ~ 1800 cc achieved HD/CRRT via temp cath Next treatment planned for wednesday  2. Sepsis: with hypotension and fever.  - permcath removed, tip cultures are pending - Acinetobacter bacteremia   - off vasopressors; critical care as above.    3. Anemia of chronic kidney disease  Lab Results  Component Value Date   HGB 8.6 (L) 04/29/2021   Scheduled EPO SQ weekly saturday   4. Secondary  Hyperparathyroidism:    Lab Results  Component Value Date   PTH 357 (H)  10/05/2018   CALCIUM 8.3 (L) 04/29/2021   CAION 1.09 (L) 04/13/2020   PHOS 2.0 (L) 04/29/2021       Hold phosphate binders until able to have oral intake.   5.  Hypokalemia IV potassium supplementation Changed potassium bath to 4K IV phosphorus supplementation    6. Volume overload - Volume removal with CRRT and HD as tolerated    LOS: 4 Hector Taft 8/8/20221:51 PM

## 2021-04-29 NOTE — Progress Notes (Signed)
NAME:  [name], MRN:  [id number], DOB:  Mar 02, 1961, LOS: 4 ADMISSION DATE:  04/25/2021, CONSULTATION DATE: 04/26/2021 REFERRING MD: Dr. Babs Bertin ,DO CHIEF COMPLAINT: Severe sepsis/septic shock  History of Present Illness:  Patient presented with altered mental status, fever and severe sepsis with septic shock.  Patient was intubated upon admission.  She had Acinetobacter bacteremia and infected permacath.  Pertinent  Medical History  ESRD dialysis MWF Coronary artery disease with chronic angina Diabetes mellitus insulin requiring Diabetic neuropathy Diabetic gastroparesis GERD Hiatal hernia Moderate mitral regurgitation Chronic intermittent abdominal pain  Significant Hospital Events: Including procedures, antibiotic start and stop dates in addition to other pertinent events   04/27/21- Tunneled vasc cath removed from left chest.  Reviewed care plan with son [name]. Patient on CRRT.  S/p vascular and renal evanluatoin . 04/28/21- patient is performing well on SBT plan for liberation from MV today.  04/29/2021-persistent encephalopathy, anemia, transfuse 1 PRBC, palliative care consult placed, Precedex initiated  Interim History / Subjective:  Patient extremely encephalopathic, extubated yesterday, remains on CRRT.  Screaming out asking for "Tiny"  Objective   Blood pressure (!) 135/58, pulse 70, temperature (!) 97.5 F (36.4 C), temperature source Axillary, resp. rate 14, height 5\' 5"  (1.651 m), weight 83.1 kg, SpO2 100 %.  Intake/Output Summary (Last 24 hours) at 04/29/2021 0931 Last data filed at 04/29/2021 0900 Gross per 24 hour  Intake 3284.4 ml  Output 5192 ml  Net -1907.6 ml   Filed Weights   04/27/21 0418 04/28/21 0451 04/29/21 0454  Weight: 82.2 kg 83.3 kg 83.1 kg    Examination: GENERAL: Chronically ill-appearing woman who appears markedly older than stated age, encephalopathic, not following commands, yelling out for "Tiny". HEAD: Normocephalic, atraumatic.  EYES:  Pupils equal, round, reactive to light.  No scleral icterus.  MOUTH: Edentulous, oral mucosa paced.   NECK: Supple. No thyromegaly. Trachea midline. No JVD.  No adenopathy. PULMONARY: Good air entry bilaterally.  Coarse, scattered rhonchi. CARDIOVASCULAR: S1 and S2. Regular rate and rhythm.  ABDOMEN: Nondistended, soft, NABS.  Cannot assess with regards to tenderness due to patient screaming. MUSCULOSKELETAL: No joint deformity, no clubbing, no edema.  NEUROLOGIC: Encephalopathic, will not follow commands. SKIN: Intact,warm,dry. PSYCH: Encephalopathic, delirious.  Resolved Hospital Problem list   Ventilator dependent respiratory failure  Assessment & Plan:  Severe sepsis/septic shock Acinetobacter bacteremia Culprit: Infected permacath Permacath removed Patient on antibiotics Temporary dialysis catheter in place On CRRT  Severe acute metabolic encephalopathy Due to severe sepsis Still encephalopathic CT head rule out acute process Agitated, initiate Precedex Fentanyl boluses for pain  Chronic abdominal pain Previous evaluation negative  ESRD on HD Discussed with Dr. Candiss Norse CRRT to complete today  Best Practice (right click and "Reselect all SmartList Selections" daily)   Diet/type: tubefeeds DVT prophylaxis: prophylactic heparin  GI prophylaxis: N/A Lines: Dialysis Catheter right femoral vein, still needed Foley:  N/A Code Status:  full code Last date of multidisciplinary goals of care discussion [unable to discuss with son today, palliative care consult placed]  Labs   CBC: Recent Labs  Lab 04/25/21 1651 04/26/21 0415 04/27/21 0417 04/28/21 0447 04/28/21 1718 04/29/21 0411  WBC 5.7 4.1 11.3* 10.2  --  6.2  NEUTROABS 4.5 3.4  --   --   --   --   HGB 9.1* 8.1* 7.7* 7.0* 7.5* 7.1*  HCT 28.8* 25.5* 24.1* 21.4* 23.7* 21.9*  MCV 89.7 91.1 88.3 87.3  --  90.1  PLT 245 188 149* 140*  --  115*    Basic Metabolic Panel: Recent Labs  Lab 04/25/21 1651  04/25/21 1838 04/26/21 0415 04/26/21 1932 04/27/21 0417 04/27/21 1608 04/28/21 0447 04/28/21 1718 04/29/21 0411  NA  --   --  131*  --  131* 133* 133* 133* 132*  K  --   --  5.0  --  4.0 3.8 2.8* 3.1* 3.1*  CL  --   --  100  --  100 100 101 101 101  CO2  --   --  24  --  26 25 27 27 28   GLUCOSE  --   --  103*  --  119* 106* 114* 155* 145*  BUN  --   --  18  --  14 10 6* 6* 6*  CREATININE  --   --  4.04*  --  2.68* 2.04* 1.27* 1.15* 1.10*  CALCIUM  --   --  7.0*  --  8.1* 8.3* 8.5* 8.8* 8.3*  MG  --  1.7 1.6* 1.6*  --   --  1.7  --  1.9  PHOS   < >  --  3.9  --  2.5 2.0* 1.5* 2.1* 2.0*   < > = values in this interval not displayed.   GFR: Estimated Creatinine Clearance: 57.1 mL/min (A) (by C-G formula based on SCr of 1.1 mg/dL (H)). Recent Labs  Lab 04/25/21 1651 04/25/21 1838 04/26/21 0415 04/27/21 0417 04/28/21 0447 04/29/21 0411  PROCALCITON  --  0.81 11.70 13.35  --   --   WBC 5.7  --  4.1 11.3* 10.2 6.2  LATICACIDVEN 1.4 1.1  --   --   --   --     Liver Function Tests: Recent Labs  Lab 04/25/21 1651 04/26/21 0415 04/27/21 0417 04/27/21 1608 04/28/21 0447 04/28/21 1718 04/29/21 0411  AST 23 23  --   --   --   --  16  ALT 14 11  --   --   --   --  13  ALKPHOS 137* 100  --   --   --   --  140*  BILITOT 0.5 0.8  --   --   --   --  0.6  PROT 7.6 5.5*  --   --   --   --  6.0*  ALBUMIN 2.3* 1.6* 2.3* 2.2* 2.3* 2.9* 2.9*  2.6*   Recent Labs  Lab 04/25/21 1651  LIPASE 20   Recent Labs  Lab 04/26/21 0415  AMMONIA 55*    ABG    Component Value Date/Time   PHART 7.37 04/26/2021 1611   PCO2ART 38 04/26/2021 1611   PO2ART 220 (H) 04/26/2021 1611   HCO3 22.0 04/26/2021 1611   TCO2 32 04/13/2020 0705   ACIDBASEDEF 3.0 (H) 04/26/2021 1611   O2SAT 99.8 04/26/2021 1611     Coagulation Profile: Recent Labs  Lab 04/25/21 1651  INR 1.2    Cardiac Enzymes: No results for input(s): CKTOTAL, CKMB, CKMBINDEX, TROPONINI in the last 168  hours.  HbA1C: Hemoglobin A1C  Date/Time Value Ref Range Status  07/19/2013 12:10 AM 9.3 (H) 4.2 - 6.3 % Final    Comment:    The American Diabetes Association recommends that a primary goal of therapy should be <7% and that physicians should reevaluate the treatment regimen in patients with HbA1c values consistently >8%.   04/01/2013 04:37 AM 7.6 (H) 4.2 - 6.3 % Final    Comment:    The American Diabetes Association recommends  that a primary goal of therapy should be <7% and that physicians should reevaluate the treatment regimen in patients with HbA1c values consistently >8%.    Hgb A1c MFr Bld  Date/Time Value Ref Range Status  03/29/2021 02:19 AM 6.2 (H) 4.8 - 5.6 % Final    Comment:    (NOTE) Pre diabetes:          5.7%-6.4%  Diabetes:              >6.4%  Glycemic control for   <7.0% adults with diabetes   12/07/2020 05:00 AM 4.6 (L) 4.8 - 5.6 % Final    Comment:    (NOTE) Pre diabetes:          5.7%-6.4%  Diabetes:              >6.4%  Glycemic control for   <7.0% adults with diabetes     CBG: Recent Labs  Lab 04/28/21 1722 04/28/21 1914 04/28/21 2339 04/29/21 0354 04/29/21 0724  GLUCAP 149* 168* 156* 134* 128*    Review of Systems:   Unable to obtain due to the patient's encephalopathy.  Allergies Allergies  Allergen Reactions   Ace Inhibitors Swelling and Anaphylaxis   Ativan [Lorazepam] Other (See Comments)    Reaction:  Hallucinations and headaches   Compazine [Prochlorperazine Edisylate] Anaphylaxis, Nausea And Vomiting and Other (See Comments)    Other reaction(s): dystonia from this vs. Reglan, 23 Jul - patient relates that she takes promethazine frequently with no problems   Sumatriptan Succinate Other (See Comments)    Other reaction(s): delirium and hallucinations per [facility] records   Zofran [Ondansetron] Nausea And Vomiting    Per pt. she is allergic to zofran or will experience adverse reaction like hallucination    Losartan Nausea  Only   Prochlorperazine Other (See Comments)    Reaction:  Unknown . Patient does not remember reaction but she does have vertigo and anxiety along with n and v at times. Could be used to treat any of these    Reglan [Metoclopramide] Other (See Comments)    Per patient her Dr. Evelina Bucy her off it    Scopolamine Other (See Comments)    Dizziness, also has vertigo already   Tape Rash    Plastic tape causes rash   Tapentadol Rash   Ultrasound Gel Itching    Patient states the Ultrasound gel caused itching while in skin contact and resolved when wiped away.    Scheduled Meds:  sodium chloride   Intravenous Once   atorvastatin  20 mg Oral QPM   chlorhexidine  15 mL Mouth Rinse BID   Chlorhexidine Gluconate Cloth  6 each Topical Q0600   cholecalciferol  1,000 Units Oral Daily   cholestyramine light  4 g Oral BID   ferrous sulfate  325 mg Oral Daily   heparin  5,000 Units Subcutaneous Q8H   lipase/protease/amylase  36,000 Units Oral TID AC   mouth rinse  15 mL Mouth Rinse q12n4p   megestrol  400 mg Oral Daily   midodrine  10 mg Oral TID WC   sevelamer carbonate  800 mg Oral TID WC & HS   silver sulfADIAZINE   Topical BID   cyanocobalamin  1,000 mcg Oral Daily   Continuous Infusions:   prismasol BGK 4/2.5 300 mL/hr at 04/29/21 0740    prismasol BGK 4/2.5 300 mL/hr at 04/29/21 0740   ampicillin-sulbactam (UNASYN) IV Stopped (04/29/21 0435)   dextrose 75 mL/hr at 04/29/21 0900  potassium PHOSPHATE IVPB (in mmol)     prismasol BGK 4/2.5 1,000 mL/hr at 04/29/21 0734   PRN Meds:.acetaminophen **OR** acetaminophen, albuterol, dextrose, docusate sodium, fentaNYL (SUBLIMAZE) injection, heparin, polyethylene glycol, sodium chloride   Critical care time: 4    The patient is critically ill with multiple organ systems failure and requires high complexity decision making for assessment and support, frequent evaluation and titration of therapies, application of advanced monitoring technologies  and extensive interpretation of multiple databases.    Renold Don, MD Advanced Bronchoscopy P[facility] [facility]    *This note was dictated using voice recognition software/Dragon.  Despite best efforts to proofread, errors can occur which can change the meaning.  Any change was purely unintentional.

## 2021-04-29 NOTE — Consult Note (Signed)
PHARMACY CONSULT NOTE  Pharmacy Consult for Electrolyte Monitoring and Replacement   Recent Labs: Potassium (mmol/L)  Date Value  04/29/2021 3.1 (L)  09/15/2014 4.8   Magnesium (mg/dL)  Date Value  04/29/2021 1.9  07/19/2013 1.9   Calcium (mg/dL)  Date Value  04/29/2021 8.3 (L)   Calcium, Total (mg/dL)  Date Value  09/15/2014 8.3 (L)   Albumin (g/dL)  Date Value  04/29/2021 2.6 (L)  04/29/2021 2.9 (L)  09/15/2014 3.0 (L)   Phosphorus (mg/dL)  Date Value  04/29/2021 2.0 (L)  09/16/2013 4.6   Sodium (mmol/L)  Date Value  04/29/2021 132 (L)  09/15/2014 135 (L)    Assessment: Patient is a 60 y/o F with medical history including ESRD on HD, PAD, COPD, CAD, diabetes, gastroparesis, history of pancreatitis, diastolic HF who is admitted with septic shock secondary to acinetobacter bacteremia. Patient was briefly intubated 8/5 - 8/7. She has been started on CRRT for hemodynamic instability. Pharmacy consulted to assist with electrolyte monitoring and replacement as indicated.   MIVF: D10w at 75 mL/hr  Goal of Therapy:  Electrolytes within normal limits  Plan:  --K 3.1, Phos 2. Will give IV potassium phosphate 15 mmol x 1 (contains 22.5 mEq K+) --Renal function panels BID while on CRRT --Continue to follow  Benita Gutter 04/29/2021 8:50 AM

## 2021-04-29 NOTE — Consult Note (Signed)
Consultation Note Date: 04/29/2021   Patient Name: [name]  DOB: 12/23/1960  MRN: [id number]  Age / Sex: 60 y.o., female  PCP: Denton Lank, MD Referring Physician: Tyler Pita, MD  Reason for Consultation: Establishing goals of care  HPI/Patient Profile:  Per EMR, [name] , a 60 y.o. female  was evaluated in triage.  Pt complains of fever, abdominal pain and generalized not feeling well.  Patient has history of known renal failure, received appropriate dialysis she reports today without any complications.  She has overall not been feeling well for the last few days, worse this afternoon. Patient is unable to answer any additional questions - unable to determine if this is related to altered status or other cause.  Clinical Assessment and Goals of Care: Patient is resting in bed with eyes closed; she does not speak to me.   Called to speak with her son. He discusses that his mother lives alone and is independent with house chores.  He states she just recently stopped driving   He discusses that she has had a lot happen to her and has been "in and out of rehab". He states independence and being able to live alone is extremely important to her. He states it was very hard for her to give up driving, and would not want to depend on others for help, and would not want to live in a facility.   We discussed her diagnoses, prognosis, GOC, EOL wishes disposition and options.  Created space and opportunity for patient  to explore thoughts and feelings regarding current medical information.   A detailed discussion was had today regarding advanced directives.  Concepts specific to code status, artifical feeding and hydration, IV antibiotics and rehospitalization were discussed.  The difference between an aggressive medical intervention path and a comfort care path was discussed.  Values and goals of care  important to patient and family were attempted to be elicited.  Discussed limitations of medical interventions to prolong quality of life in some situations and discussed the concept of human mortality.  He states she had been a DNR but changed it. Unlcear as to when he feels she changed her mind, but he believes it was "when she was admitted in March". Discussed a MOST form that was scanned in March 2022 for DNR/DNI, when PMT saw here at that time. Discussed that code status has to be addressed every time you come to the hospital, and the default is full code/full scope. He will consider this.  SUMMARY OF RECOMMENDATIONS   Full code/ full scope at this time.  Prognosis:  Poor       Primary Diagnoses: Present on Admission:  Severe sepsis ([place])  HCAP (healthcare-associated pneumonia)  Acute metabolic encephalopathy  Hypoglycemia associated with diabetes ([place])  GERD (gastroesophageal reflux disease)  ESRD (end stage renal disease) ([place])   I have reviewed the medical record, interviewed the patient and family, and examined the patient. The following aspects are pertinent.  Past Medical History:  Diagnosis  Date   Anemia    Anginal pain ([place])    Anxiety    Arthritis    Asthma    Broken wrist    Bronchitis    chronic diastolic CHF 3/78/5885   Chronic kidney disease    COPD (chronic obstructive pulmonary disease) ([place])    Coronary artery disease    a. cath 2013: stenting to RCA (report not available); b. cath 2014: LM nl, pLAD 40%, mLAD nl, ost LCx 40%, mid LCx nl, pRCA 30% @ site of prior stent, mRCA 50%   Depression    Diabetes mellitus ([place])    Diabetes mellitus without complication ([place])    Diabetic neuropathy ([place])    dialysis 2006   Diverticulosis    Dizziness    Dyspnea    Elevated lipids    Environmental and seasonal allergies    ESRD (end stage renal disease) on dialysis ([place])    M-W-F   Gastroparesis    GERD (gastroesophageal reflux disease)    Headache     History of anemia due to chronic kidney disease    History of hiatal hernia    HOH (hard of hearing)    Hx of pancreatitis 2015   Hypertension    Lower extremity edema    Mitral regurgitation    a. echo 10/2013: EF 62%, noWMA, mildly dilated LA, mild to mod MR/TR, GR1DD   Myocardial infarction ([place])    Orthopnea    Parathyroid abnormality ([place])    Peripheral arterial disease ([place])    Pneumonia    Renal cancer ([place])    Renal insufficiency    Pt is on dialysis on M,W + F.   Wheezing    Social History   Socioeconomic History   Marital status: Divorced    Spouse name: Not on file   Number of children: Not on file   Years of education: Not on file   Highest education level: Not on file  Occupational History   Not on file  Tobacco Use   Smoking status: Never   Smokeless tobacco: Never  Vaping Use   Vaping Use: Never used  Substance and Sexual Activity   Alcohol use: Not Currently    Comment: glass wine week per pt   Drug use: Yes    Types: Marijuana    Comment: once a day   Sexual activity: Never  Other Topics Concern   Not on file  Social History Narrative   ** Merged History Encounter **       Social Determinants of Health   Financial Resource Strain: Not on file  Food Insecurity: Not on file  Transportation Needs: Not on file  Physical Activity: Not on file  Stress: Not on file  Social Connections: Not on file   Family History  Problem Relation Age of Onset   Kidney disease Mother    Diabetes Mother    Cancer Father    Kidney disease Sister    Scheduled Meds:  atorvastatin  20 mg Per Tube QPM   chlorhexidine  15 mL Mouth Rinse BID   Chlorhexidine Gluconate Cloth  6 each Topical Q0600   [START ON 04/30/2021] cholecalciferol  1,000 Units Per Tube Daily   feeding supplement (PROSource TF)  45 mL Per Tube BID   fentaNYL  1 patch Transdermal Q72H   free water  30 mL Per Tube Q4H   heparin  5,000 Units Subcutaneous Q8H   lipase/protease/amylase  36,000 Units  Oral TID AC   mouth  rinse  15 mL Mouth Rinse q12n4p   midodrine  10 mg Per Tube TID WC   multivitamin  1 tablet Per Tube QHS   [START ON 04/30/2021] nutrition supplement (JUVEN)  1 packet Per Tube BID BM   sevelamer carbonate  800 mg Per Tube TID WC & HS   silver sulfADIAZINE   Topical BID   [START ON 04/30/2021] cyanocobalamin  1,000 mcg Per Tube Daily   Continuous Infusions:  [START ON 04/30/2021] ampicillin-sulbactam (UNASYN) IV     dexmedetomidine (PRECEDEX) IV infusion Stopped (04/29/21 1452)   feeding supplement (OSMOLITE 1.5 CAL) 1,000 mL (04/29/21 1252)   potassium PHOSPHATE IVPB (in mmol) Stopped (04/29/21 1452)   PRN Meds:.acetaminophen **OR** acetaminophen, albuterol, dextrose, docusate sodium, fentaNYL (SUBLIMAZE) injection, heparin sodium (porcine), polyethylene glycol Medications Prior to Admission:  Prior to Admission medications   Medication Sig Start Date End Date Taking? Authorizing Provider  albuterol (PROVENTIL) (2.5 MG/3ML) 0.083% nebulizer solution Take 2.5 mg by nebulization every 4 (four) hours as needed for wheezing or shortness of breath.   Yes [provider]  albuterol (VENTOLIN HFA) 108 (90 Base) MCG/ACT inhaler Inhale 2 puffs into the lungs every 4 (four) hours as needed for wheezing or shortness of breath. 11/09/19  Yes [provider]  alum & mag hydroxide-simeth (MAALOX/MYLANTA) 200-200-20 MG/5ML suspension Take 15 mLs by mouth every 6 (six) hours as needed for indigestion or heartburn. 06/25/20  Yes Enzo Bi, MD  amLODipine (NORVASC) 10 MG tablet Take 1 tablet (10 mg total) by mouth daily. 06/25/20  Yes Enzo Bi, MD  ammonium lactate (LAC-HYDRIN) 12 % lotion Apply 1 application topically 2 (two) times daily as needed for dry skin.    Yes [provider]  aspirin EC 81 MG tablet Take 81 mg by mouth daily.   Yes [provider]  atorvastatin (LIPITOR) 20 MG tablet Take 20 mg by mouth every evening.  10/24/19  Yes [provider]  Biotin 1 MG CAPS Take 1 mg by mouth daily.   Yes [provider]  calcium carbonate (TUMS - DOSED IN MG ELEMENTAL CALCIUM) 500 MG chewable tablet Chew 2.5 tablets (500 mg of elemental calcium total) by mouth every 6 (six) hours as needed for indigestion. 06/25/20  Yes Enzo Bi, MD  Cholecalciferol (VITAMIN D3) 25 MCG (1000 UT) CAPS Take 1,000 Units by mouth daily.    Yes [provider]  cyanocobalamin 1000 MCG tablet Take 1,000 mcg by mouth daily.    Yes [provider]  diphenhydrAMINE (BENADRYL) 25 MG tablet Take 25 mg by mouth every 6 (six) hours as needed for itching.   Yes [provider]  epoetin alfa (EPOGEN) 10000 UNIT/ML injection Inject 1 mL (10,000 Units total) into the vein every Monday, Wednesday, and Friday with hemodialysis. 06/27/20  Yes Enzo Bi, MD  Ferrous Sulfate (IRON) 325 (65 Fe) MG TABS Take 325 mg by mouth daily.    Yes [provider]  furosemide (LASIX) 80 MG tablet Take 80 mg by mouth daily.   Yes [provider]  gabapentin (NEURONTIN) 100 MG capsule Take 100 mg by mouth at bedtime.   Yes [provider]  HYDROcodone-acetaminophen (NORCO/VICODIN) 5-325 MG tablet Take 1 tablet by mouth 3 (three) times daily as needed for moderate pain or severe pain. Patient taking differently: Take 1 tablet by mouth every 6 (six) hours as needed for moderate pain or severe pain. 12/17/20  Yes Lorella Nimrod, MD  hydrOXYzine (ATARAX/VISTARIL) 25 MG tablet  Take 25 mg by mouth daily as needed for itching.   Yes [provider]  lidocaine (LIDODERM) 5 % Place 1 patch onto the skin daily. (Remove after 12 hours -- leave off for 12 hours before applying new patch)   Yes [provider]  meclizine (ANTIVERT) 25 MG tablet Take 25 mg by mouth every 6 (six) hours as needed for dizziness.   Yes [provider]  mirtazapine (REMERON) 7.5 MG tablet Take 7.5 mg by mouth daily. 04/15/21  Yes [provider]  multivitamin (RENA-VIT) TABS tablet Take 1 tablet by mouth daily.    Yes [provider]  mupirocin ointment (BACTROBAN) 2 % Apply 1 application topically daily as needed (leg rash).  11/09/19  Yes [provider]  nitroGLYCERIN (NITROSTAT) 0.4 MG SL tablet Place 0.4 mg under the tongue every 5 (five) minutes as needed for chest pain.    Yes [provider]  pantoprazole (PROTONIX) 40 MG tablet Take 40 mg by mouth daily. 08/28/19  Yes [provider]  polyethylene glycol (MIRALAX / GLYCOLAX) 17 g packet Take 17 g by mouth daily as needed. Patient taking differently: Take 17 g by mouth daily as needed for mild constipation or moderate constipation. 04/02/21  Yes Nolberto Hanlon, MD  Probiotic Product (PROBIOTIC PO) Take 1 capsule by mouth daily.   Yes [provider]  sevelamer carbonate (RENVELA) 800 MG tablet Take 800 mg by mouth 4 (four) times daily. (Take with meals and snack)   Yes [provider]  vitamin E 180 MG (400 UNITS) capsule Take 400 Units by mouth daily.   Yes [provider]  hydrALAZINE (APRESOLINE) 50 MG tablet Take 1 tablet (50 mg total) by mouth every 8 (eight) hours. Patient not taking: No sig reported 06/25/20   Enzo Bi, MD  megestrol (MEGACE) 400 MG/10ML suspension Take 10 mLs (400 mg total) by mouth daily. Patient not taking: No sig reported 09/21/20   Shelly Coss, MD  Nutritional Supplements (FEEDING SUPPLEMENT, NEPRO CARB STEADY,) LIQD Take 237 mLs by mouth 2 (two) times daily between meals. 12/17/20   Lorella Nimrod, MD   Allergies  Allergen Reactions   Ace Inhibitors Swelling and Anaphylaxis   Ativan [Lorazepam] Other (See Comments)    Reaction:  Hallucinations and headaches   Compazine [Prochlorperazine Edisylate] Anaphylaxis, Nausea And Vomiting and Other (See Comments)    Other reaction(s): dystonia from this vs. Reglan, 23 Jul - patient relates that she takes promethazine frequently with  no problems   Sumatriptan Succinate Other (See Comments)    Other reaction(s): delirium and hallucinations per [facility] records   Zofran [Ondansetron] Nausea And Vomiting    Per pt. she is allergic to zofran or will experience adverse reaction like hallucination    Losartan Nausea Only   Prochlorperazine Other (See Comments)    Reaction:  Unknown . Patient does not remember reaction but she does have vertigo and anxiety along with n and v at times. Could be used to treat any of these    Reglan [Metoclopramide] Other (See Comments)    Per patient her Dr. Evelina Bucy her off it    Scopolamine Other (See Comments)    Dizziness, also has vertigo already   Tape Rash    Plastic tape causes rash   Tapentadol Rash   Ultrasound Gel Itching    Patient states the Ultrasound gel caused itching while in skin contact and resolved when wiped away.   Review of Systems  Unable to  perform ROS  Physical Exam Constitutional:      Comments: Eyes closed.   Pulmonary:     Effort: Pulmonary effort is normal.    Vital Signs: BP (!) 130/58   Pulse 62   Temp (!) 97.5 F (36.4 C) (Axillary)   Resp 13   Ht 5\' 5"  (1.651 m)   Wt 83.1 kg   SpO2 100%   BMI 30.49 kg/m  Pain Scale: 0-10   Pain Score: 0-No pain   SpO2: SpO2: 100 % O2 Device:SpO2: 100 % O2 Flow Rate: .O2 Flow Rate (L/min): 4 L/min  IO: Intake/output summary:  Intake/Output Summary (Last 24 hours) at 04/29/2021 1551 Last data filed at 04/29/2021 1500 Gross per 24 hour  Intake 2983.76 ml  Output 4901 ml  Net -1917.24 ml    LBM: Last BM Date: 04/26/21 Baseline Weight: Weight: 52 kg Most recent weight: Weight: 83.1 kg       Time In: 3:50 Time Out: 4:20 Time Total: 30 min Greater than 50%  of this time was spent counseling and coordinating care related to the above assessment and plan.  Signed by: Asencion Gowda, NP   Please contact Palliative Medicine Team phone at [phone] for questions and concerns.  For individual provider: See  Shea Evans

## 2021-04-30 ENCOUNTER — Other Ambulatory Visit: Payer: Self-pay

## 2021-04-30 DIAGNOSIS — I2510 Atherosclerotic heart disease of native coronary artery without angina pectoris: Secondary | ICD-10-CM

## 2021-04-30 DIAGNOSIS — R6520 Severe sepsis without septic shock: Secondary | ICD-10-CM | POA: Diagnosis not present

## 2021-04-30 DIAGNOSIS — A419 Sepsis, unspecified organism: Secondary | ICD-10-CM | POA: Diagnosis not present

## 2021-04-30 LAB — BPAM RBC
Blood Product Expiration Date: 202209102359
ISSUE DATE / TIME: 202208081054
Unit Type and Rh: 5100

## 2021-04-30 LAB — CBC
HCT: 29.2 % — ABNORMAL LOW (ref 36.0–46.0)
Hemoglobin: 9.2 g/dL — ABNORMAL LOW (ref 12.0–15.0)
MCH: 27.9 pg (ref 26.0–34.0)
MCHC: 31.5 g/dL (ref 30.0–36.0)
MCV: 88.5 fL (ref 80.0–100.0)
Platelets: 107 10*3/uL — ABNORMAL LOW (ref 150–400)
RBC: 3.3 MIL/uL — ABNORMAL LOW (ref 3.87–5.11)
RDW: 20.2 % — ABNORMAL HIGH (ref 11.5–15.5)
WBC: 7 10*3/uL (ref 4.0–10.5)
nRBC: 0 % (ref 0.0–0.2)

## 2021-04-30 LAB — GLUCOSE, CAPILLARY
Glucose-Capillary: 97 mg/dL (ref 70–99)
Glucose-Capillary: 121 mg/dL — ABNORMAL HIGH (ref 70–99)
Glucose-Capillary: 113 mg/dL — ABNORMAL HIGH (ref 70–99)
Glucose-Capillary: 100 mg/dL — ABNORMAL HIGH (ref 70–99)
Glucose-Capillary: 126 mg/dL — ABNORMAL HIGH (ref 70–99)
Glucose-Capillary: 142 mg/dL — ABNORMAL HIGH (ref 70–99)

## 2021-04-30 LAB — RENAL FUNCTION PANEL
Albumin: 2.4 g/dL — ABNORMAL LOW (ref 3.5–5.0)
Anion gap: 8 (ref 5–15)
BUN: 10 mg/dL (ref 8–23)
CO2: 26 mmol/L (ref 22–32)
Calcium: 8.2 mg/dL — ABNORMAL LOW (ref 8.9–10.3)
Chloride: 99 mmol/L (ref 98–111)
Creatinine, Ser: 1.59 mg/dL — ABNORMAL HIGH (ref 0.44–1.00)
GFR, Estimated: 37 mL/min — ABNORMAL LOW (ref >60)
Glucose, Bld: 100 mg/dL — ABNORMAL HIGH (ref 70–99)
Phosphorus: 2.2 mg/dL — ABNORMAL LOW (ref 2.5–4.6)
Potassium: 3.8 mmol/L (ref 3.5–5.1)
Sodium: 133 mmol/L — ABNORMAL LOW (ref 135–145)

## 2021-04-30 LAB — TYPE AND SCREEN
ABO/RH(D): O POS
Antibody Screen: NEGATIVE
Unit division: 0

## 2021-04-30 LAB — METHYLMALONIC ACID, SERUM: Methylmalonic Acid, Quantitative: 512 nmol/L — ABNORMAL HIGH (ref 0–378)

## 2021-04-30 MED ORDER — QUETIAPINE FUMARATE 25 MG PO TABS
25.0000 mg | ORAL_TABLET | Freq: Two times a day (BID) | ORAL | Status: DC
  Administered 2021-04-30 – 2021-05-10 (×19): 25 mg
  Filled 2021-04-30 (×19): qty 1

## 2021-04-30 MED ORDER — MIRTAZAPINE 15 MG PO TABS
7.5000 mg | ORAL_TABLET | Freq: Every day | ORAL | Status: DC
  Administered 2021-04-30 – 2021-05-09 (×10): 7.5 mg
  Filled 2021-04-30 (×10): qty 1

## 2021-04-30 NOTE — Progress Notes (Addendum)
Daily Progress Note   Patient Name: [name]       Date: 04/30/2021 DOB: [birth year]  Age: 60 y.o. MRN#: [id number] Attending Physician: Flora Lipps, MD Primary Care Physician: Denton Lank, MD Admit Date: 04/25/2021  Reason for Consultation/Follow-up: Establishing goals of care  Subjective: Patient is resting in bed, currently being dialyzed. She opens her eyes briefly to look at me when I speak but does not say anything.   Spoke with [name]. [name] discusses family dynamics. He states he has a sister, but she only comes or calls occassionally, and he does not want her involvement in decision making. Explained that without HPOA papers she is entitled to help with decision making equally with him.   We did discuss that what is important is honoring the patient's wishes. We discussed the MOST form completed in March for DNR/DNI. Notes from her palliative conversations in her previous March admission were reviewed. The notes indicate she was tired and was ready to go to heaven, but continuing HD for her family. He states he is aware of this as she has had conversations with him.  He states his mother has been suffering. He states her QOL was very important to her, and it had changed to no longer be acceptable prior to this hospitalization. He states she has not been happy with losing independence.   We discussed her diagnoses, prognosis, GOC, EOL wishes disposition and options.  Created space and opportunity for patient  to explore thoughts and feelings regarding current medical information.   A detailed discussion was had today regarding advanced directives.  Concepts specific to code status, artifical feeding and hydration, IV antibiotics and rehospitalization were discussed.  The difference  between an aggressive medical intervention path and a comfort care path was discussed in detail.  Values and goals of care important to patient and family were attempted to be elicited.  Discussed limitations of medical interventions to prolong quality of life in some situations and discussed the concept of human mortality.  He states he is ready to honor his mother's wishes as he knows she has been ready but continuing on for them. He states he does not want her to suffer any longer. He states he wants to talk with his sister and other family members about shifting to comfort care, and  about hospice which was discussed in detail. He advises he will call back later to change code status and plan for comfort care. Plans to meet at bedside tomorrow at 10:00. He plans for sister to be present in person or by phone.   Length of Stay: 5  Current Medications: Scheduled Meds:  . atorvastatin  20 mg Per Tube QPM  . chlorhexidine  15 mL Mouth Rinse BID  . Chlorhexidine Gluconate Cloth  6 each Topical Q0600  . cholecalciferol  1,000 Units Per Tube Daily  . feeding supplement (PROSource TF)  45 mL Per Tube BID  . fentaNYL  1 patch Transdermal Q72H  . free water  30 mL Per Tube Q4H  . heparin  5,000 Units Subcutaneous Q8H  . lipase/protease/amylase  36,000 Units Oral TID AC  . mouth rinse  15 mL Mouth Rinse q12n4p  . midodrine  10 mg Per Tube TID WC  . mirtazapine  7.5 mg Per Tube QHS  . multivitamin  1 tablet Per Tube QHS  . nutrition supplement (JUVEN)  1 packet Per Tube BID BM  . QUEtiapine  25 mg Per Tube BID  . sevelamer carbonate  800 mg Per Tube TID WC & HS  . silver sulfADIAZINE   Topical BID  . cyanocobalamin  1,000 mcg Per Tube Daily    Continuous Infusions: . ampicillin-sulbactam (UNASYN) IV    . dexmedetomidine (PRECEDEX) IV infusion Stopped (04/30/21 0128)  . feeding supplement (OSMOLITE 1.5 CAL) 1,000 mL (04/29/21 1252)    PRN Meds: acetaminophen **OR** acetaminophen,  albuterol, dextrose, docusate sodium, fentaNYL (SUBLIMAZE) injection, heparin sodium (porcine), polyethylene glycol  Physical Exam Constitutional:      Comments: Opened eyes briefly.   Pulmonary:     Effort: Pulmonary effort is normal.            Vital Signs: BP (!) 144/52   Pulse 66   Temp (!) 97.3 F (36.3 C) (Axillary)   Resp 12   Ht 5\' 5"  (1.651 m)   Wt 80.6 kg   SpO2 100%   BMI 29.57 kg/m  SpO2: SpO2: 100 % O2 Device: O2 Device: Nasal Cannula O2 Flow Rate: O2 Flow Rate (L/min): 2 L/min  Intake/output summary:  Intake/Output Summary (Last 24 hours) at 04/30/2021 1318 Last data filed at 04/30/2021 1228 Gross per 24 hour  Intake 905.03 ml  Output 2310 ml  Net -1404.97 ml   LBM: Last BM Date: 04/28/21 Baseline Weight: Weight: 52 kg Most recent weight: Weight: 80.6 kg         Patient Active Problem List   Diagnosis Date Noted  . Severe sepsis ([place]) 04/25/2021  . HCAP (healthcare-associated pneumonia) 04/25/2021  . Acute metabolic encephalopathy 16/06/9603  . UTI (urinary tract infection) 03/29/2021  . Chronic respiratory failure with hypoxia ([place]) 12/18/2020  . Superior vena caval syndrome 12/14/2020  . Chronic diastolic CHF (congestive heart failure) ([place]) 12/04/2020  . Bradycardia 09/17/2020  . Advanced care planning/counseling discussion   . Discitis of thoracic region   . Fluid overload 08/10/2020  . HLD (hyperlipidemia) 08/10/2020  . Chest pain 08/10/2020  . CAD (coronary artery disease) 08/10/2020  . GERD (gastroesophageal reflux disease) 08/10/2020  . Obesity (BMI 30-39.9) 06/06/2020  . Anemia in ESRD (end-stage renal disease) ([place]) 05/31/2020  . Type 1 diabetes mellitus ([place]) 05/31/2020  . End-stage renal disease ([place]) 05/31/2020  . Hypertensive disorder 05/31/2020  . Arm pain 05/21/2020  . Cellulitis of right leg 05/02/2020  . ESRD (end stage renal disease) (  [place]) 04/13/2020  . SOBOE (shortness of breath on exertion) 02/08/2020  . Hematoma of arm,  left, subsequent encounter 11/24/2019  . Respiratory failure ([place])   . Shortness of breath   . Complication of arteriovenous dialysis fistula 11/13/2019  . PVD (peripheral vascular disease) ([place]) 11/13/2019  . Severe anemia 11/13/2019  . Pressure ulcer, stage 2 ([place]) 11/13/2019  . ESRD on hemodialysis ([place])   . Hemorrhagic shock ([place]) 11/11/2019  . Leg pain 10/02/2019  . CAP (community acquired pneumonia) 10/01/2019  . Atherosclerosis of native arteries of extremity with intermittent claudication ([place]) 05/01/2019  . Right-sided headache   . COPD (chronic obstructive pulmonary disease) ([place]) 10/06/2018  . Syncope 10/04/2018  . Symptomatic anemia 06/18/2018  . Gastroparesis due to DM ([place]) 01/18/2018  . Complication of vascular access for dialysis 12/04/2017  . Osteomyelitis ([place]) 09/30/2017  . Carotid stenosis 06/18/2017  . Bilateral carotid artery stenosis 06/16/2017  . Benign essential HTN 05/19/2017  . CKD (chronic kidney disease) stage 5, GFR less than 15 ml/min (HCC) 05/19/2017  . Hyperlipidemia, mixed 05/19/2017  . Shortness of breath 05/04/2017  . Rash, skin 12/16/2016  . Cellulitis of lower extremity 07/29/2016  . Chronic venous insufficiency 07/29/2016  . Lymphedema 07/29/2016  . TIA (transient ischemic attack) 04/21/2016  . Altered mental status 04/08/2016  . Hyperammonemia ([place]) 04/08/2016  . Elevated troponin 04/08/2016  . Depression 04/08/2016  . Depression, major, recurrent, severe with psychosis ([place]) 04/08/2016  . Blood in stool   . Intractable cyclical vomiting with nausea   . Reflux esophagitis   . Gastritis   . Generalized abdominal pain   . Uncontrollable vomiting   . Major depressive disorder, recurrent episode, moderate ([place]) 03/15/2016  . Adjustment disorder with mixed anxiety and depressed mood 03/15/2016  . Malnutrition of moderate degree 12/01/2015  . Renal mass   . Dyspnea   . Acute renal failure ([place])   . Respiratory failure ([place])   . High  temperature 11/14/2015  . Encounter for central line placement   . Encounter for orogastric (OG) tube placement   . Nausea 11/12/2015  . Hyperkalemia 10/03/2015  . Diarrhea, unspecified 07/22/2015  . Pneumonia 05/21/2015  . Hypoglycemia 04/24/2015  . Unresponsiveness 04/24/2015  . Sinus bradycardia 04/24/2015  . Hypothermia 04/24/2015  . Acute respiratory failure ([place]) 04/24/2015  . Acute on chronic diastolic CHF (congestive heart failure) ([place]) 04/05/2015  . Diabetic gastroparesis ([place]) 04/05/2015  . Hypokalemia 04/05/2015  . Generalized weakness 04/05/2015  . Acute pulmonary edema ( [place]) 04/03/2015  . Nausea and vomiting 04/03/2015  . Hypoglycemia associated with diabetes ([place]) 04/03/2015  . Anemia of chronic disease 04/03/2015  . Secondary hyperparathyroidism ([place]) 04/03/2015  . Pressure ulcer 04/02/2015  . Acute respiratory failure with hypoxia ([place]) 04/01/2015  . Adjustment disorder with anxiety 03/14/2015  . Somatic symptom disorder, mild 03/08/2015  . Coronary artery disease involving native coronary artery of native heart without angina pectoris   . Nausea & vomiting 03/06/2015  . Abdominal pain 03/06/2015  . Diabetes mellitus, type II ([place]) 03/06/2015  . HTN (hypertension) 03/06/2015  . Gastroparesis 02/24/2015  . Pleural effusion 02/19/2015  . Bacteremia due to Enterococcus 02/19/2015  . End-stage renal disease on hemodialysis ([place]) 02/19/2015  . Diarrhea 08/01/2014    Palliative Care Assessment & Plan    Recommendations/Plan: Patient is known to PMT service. Please see the notes from March 2022 admission where patient verbalized her wishes and feelings. Please see her completed MOST form from that admission under ACP tab (left  upper screen under MRN, bed assignment, and code status). It is signed for DNR/DNI.  Son would like to transition to comfort care but wants to talk with family first. He does have a sister and there is no HPOA. Will need to speak with her  prior to making a change to comfort care.   Code Status:    Code Status Orders  (From admission, onward)           Start     Ordered   04/26/21 1431  Full code  Continuous        04/26/21 1432           Code Status History     Date Active Date Inactive Code Status Order ID Comments User Context   04/25/2021 2123 04/26/2021 1432 Full Code [id number]  Rhetta Mura, DO ED   03/29/2021 0244 04/02/2021 2300 Full Code [id number]  Athena Masse, MD ED   12/04/2020 2042 12/17/2020 2147 DNR [id number]  Ileene Musa T, DO ED   12/04/2020 1939 12/04/2020 2042 Full Code [id number]  Orene Desanctis, DO ED   09/17/2020 1431 09/21/2020 1925 Full Code [id number]  Tyna Jaksch, MD ED   08/10/2020 1323 08/17/2020 2223 Full Code [id number]  Ivor Costa, MD ED   06/02/2020 2251 06/25/2020 2316 Full Code [id number]  Elwyn Reach, MD ED   05/22/2020 1201 05/23/2020 2050 Full Code [id number]  Vashti Hey, MD ED   05/02/2020 2057 05/06/2020 1929 Full Code [id number]  Mansy, Arvella Merles, MD ED   04/13/2020 1458 04/14/2020 2239 Full Code [id number]  Judithann Sheen, Janalyn Harder, PA-C Inpatient   11/24/2019 2038 11/30/2019 1824 Full Code [id number]  Acheampong, Warnell Bureau, MD Inpatient   11/11/2019 0715 11/19/2019 0006 Full Code [id number]  Awilda Bill, NP ED   09/30/2019 2126 10/01/2019 2257 Full Code [id number]  Nolberto Hanlon, MD ED   10/04/2018 1847 10/12/2018 0027 Full Code [id number]  Dustin Flock, MD Inpatient   06/18/2018 0449 06/20/2018 1642 Full Code [id number]  Arta Silence, MD ED   02/12/2018 1312 02/16/2018 0050 Full Code [id number]  Epifanio Lesches, MD ED   01/17/2018 1848 01/21/2018 1341 Full Code [id number]  Vaughan Basta, MD Inpatient   09/30/2017 1804 10/10/2017 2238 Full Code [id number]  Vaughan Basta, MD Inpatient   09/03/2017 2243 09/05/2017 1901 Full Code [id number]  Lance Coon, MD Inpatient   05/04/2017 1833 05/05/2017 2330 Full Code [id number]  Henreitta Leber, MD Inpatient   03/23/2017  1551 03/25/2017 1739 Full Code [id number]  Gladstone Lighter, MD Inpatient   04/21/2016 1559 04/21/2016 1717 Full Code [id number]  Nicholes Mango, MD Inpatient   04/08/2016 1415 04/11/2016 1947 Full Code [id number]  Theodoro Grist, MD Inpatient   03/15/2016 1322 03/20/2016 0019 Full Code [id number]  Bettey Costa, MD Inpatient   02/16/2016 1409 02/19/2016 2238 Full Code [id number]  Bettey Costa, MD ED   12/17/2015 1139 12/21/2015 2209 Full Code [id number]  Dustin Flock, MD ED   11/12/2015 1602 12/01/2015 1943 Full Code [id number]  Bettey Costa, MD Inpatient   10/07/2015 0937 10/12/2015 1959 Full Code [id number]  Fritzi Mandes, MD ED   10/03/2015 0355 10/05/2015 2230 Full Code [id number]  Lance Coon, MD Inpatient   08/31/2015 1600 09/03/2015 2209 Full Code [id number]  Demetrios Loll, MD ED   07/26/2015 1528 07/26/2015 2136 Full Code [id number]  Dionisio David, MD Inpatient   07/22/2015 1736 07/26/2015 1528 Full Code [id number]  Vaughan Basta, MD Inpatient   06/05/2015 2039 06/07/2015 2003 Full Code  [id number]  Bettey Costa, MD Inpatient   05/21/2015 2142 05/24/2015 1626 Full Code [id number]  Henreitta Leber, MD Inpatient   04/24/2015 0855 04/26/2015 2211 Full Code [id number]  Demetrios Loll, MD Inpatient   04/01/2015 1558 04/05/2015 2007 Full Code [id number]  Epifanio Lesches, MD ED   03/06/2015 0805 03/09/2015 2111 Full Code [id number]  Juluis Mire, MD Inpatient   02/19/2015 1614 02/24/2015 1741 Full Code [id number]  Aldean Jewett, MD Inpatient       Prognosis: Poor   Care plan was discussed with CCM  Thank you for allowing the Palliative Medicine Team to assist in the care of this patient.   Time In: 12:00 Time Out: 1:10 Total Time 70 min Prolonged Time Billed  yes       Greater than 50%  of this time was spent counseling and coordinating care related to the above assessment and plan.  Asencion Gowda, NP  Please contact Palliative Medicine Team phone at [phone] for questions and concerns.

## 2021-04-30 NOTE — Progress Notes (Signed)
Updated pts son Jeilyn Reznik via telephone regarding pts condition and current plan of care all questions were answered.  Mr. Moncayo was appreciative of the update will continue to monitor and assess pt.  Rosilyn Mings, AGNP  Pulmonary/Critical Care Pager [phone] (please enter 7 digits) PCCM Consult Pager [phone] (please enter 7 digits)

## 2021-04-30 NOTE — Progress Notes (Signed)
[facility]  ROUNDING NOTE   Subjective:   [name] was admitted to [facility] on 04/25/2021 for Sepsis [facility]) [A41.9] Severe sepsis ([place]) [A41.9, R65.20] Fever in other diseases [R50.81]  Remains critically ill Off pressors now Extubated 04/28/2021 CRRT stopped on 8/8. IHD today Currently sedated with precedex   Objective:  Vital signs in last 24 hours:  Temp:  [97.2 F (36.2 C)-98.7 F (37.1 C)] 97.3 F (36.3 C) (08/09 0800) Pulse Rate:  [50-77] 73 (08/09 0800) Resp:  [10-21] 19 (08/09 0800) BP: (106-153)/(49-78) 145/64 (08/09 0800) SpO2:  [98 %-100 %] 98 % (08/09 0800) FiO2 (%):  [28 %-36 %] 36 % (08/08 1800) Weight:  [80.6 kg] 80.6 kg (08/09 0500)  Weight change: -2.5 kg Filed Weights   04/28/21 0451 04/29/21 0454 04/30/21 0500  Weight: 83.3 kg 83.1 kg 80.6 kg    Intake/Output: I/O last 3 completed shifts: In: 3048.6 [I.V.:1253.6; Blood:401.5; Other:42.5; NG/GT:758.3; IV Piggyback:592.6] Out: 3929 [JQBHA:1937]   Intake/Output this shift:  No intake/output data recorded.  Physical Exam: General: Critically ill appearing  Head: Normocephalic, atraumatic. Moist oral mucosal membranes  Lungs:  Coarse breath sounds  Heart: Regular rate and rhythm  Abdomen:  Soft, nontender,   Extremities: ++ peripheral edema.  Neurologic: not following commands, delirious  Skin: No lesions  Access: Right femoral temporary dialysis catheter    Basic Metabolic Panel: Recent Labs  Lab 04/25/21 1838 04/26/21 0415 04/26/21 1932 04/27/21 0417 04/27/21 1608 04/28/21 0447 04/28/21 1718 04/29/21 0411 04/30/21 0512  NA  --  131*  --    < > 133* 133* 133* 132* 133*  K  --  5.0  --    < > 3.8 2.8* 3.1* 3.1* 3.8  CL  --  100  --    < > 100 101 101 101 99  CO2  --  24  --    < > 25 27 27 28 26   GLUCOSE  --  103*  --    < > 106* 114* 155* 145* 100*  BUN  --  18  --    < > 10 6* 6* 6* 10  CREATININE  --  4.04*  --    < > 2.04* 1.27* 1.15* 1.10* 1.59*  CALCIUM  --   7.0*  --    < > 8.3* 8.5* 8.8* 8.3* 8.2*  MG 1.7 1.6* 1.6*  --   --  1.7  --  1.9  --   PHOS  --  3.9  --    < > 2.0* 1.5* 2.1* 2.0* 2.2*   < > = values in this interval not displayed.     Liver Function Tests: Recent Labs  Lab 04/25/21 1651 04/26/21 0415 04/27/21 0417 04/27/21 1608 04/28/21 0447 04/28/21 1718 04/29/21 0411 04/30/21 0512  AST 23 23  --   --   --   --  16  --   ALT 14 11  --   --   --   --  13  --   ALKPHOS 137* 100  --   --   --   --  140*  --   BILITOT 0.5 0.8  --   --   --   --  0.6  --   PROT 7.6 5.5*  --   --   --   --  6.0*  --   ALBUMIN 2.3* 1.6*   < > 2.2* 2.3* 2.9* 2.9*  2.6* 2.4*   < > =  values in this interval not displayed.    Recent Labs  Lab 04/25/21 1651  LIPASE 20    Recent Labs  Lab 04/26/21 0415  AMMONIA 55*     CBC: Recent Labs  Lab 04/25/21 1651 04/26/21 0415 04/27/21 0417 04/28/21 0447 04/28/21 1718 04/29/21 0411 04/29/21 1318 04/30/21 0512  WBC 5.7 4.1 11.3* 10.2  --  6.2  --  7.0  NEUTROABS 4.5 3.4  --   --   --   --   --   --   HGB 9.1* 8.1* 7.7* 7.0* 7.5* 7.1* 8.6* 9.2*  HCT 28.8* 25.5* 24.1* 21.4* 23.7* 21.9* 26.6* 29.2*  MCV 89.7 91.1 88.3 87.3  --  90.1  --  88.5  PLT 245 188 149* 140*  --  115*  --  107*     Cardiac Enzymes: No results for input(s): CKTOTAL, CKMB, CKMBINDEX, TROPONINI in the last 168 hours.  BNP: Invalid input(s): POCBNP  CBG: Recent Labs  Lab 04/29/21 1448 04/29/21 1940 04/29/21 2346 04/30/21 0357 04/30/21 0740  GLUCAP 93 113* 108* 57 121*     Microbiology: Results for orders placed or performed during the hospital encounter of 04/25/21  Culture, blood (Routine x 2)     Status: Abnormal   Collection Time: 04/25/21  4:51 PM   Specimen: BLOOD  Result Value Ref Range Status   Specimen Description   Final    BLOOD LW Performed at [facility], [address], [place], [place] [address]    Special Requests   Final    BOTTLES DRAWN AEROBIC AND ANAEROBIC Blood  Culture adequate volume Performed at [facility], [address] [place], [place] [address]    Culture  Setup Time   Final    GRAM VARIABLE COCCOBACILLI AEROBIC BOTTLE ONLY Organism ID to follow CRITICAL RESULT CALLED TO, READ BACK BY AND VERIFIED WITH: [facility] AT 2426 04/26/21.PMF Performed at [facility], [address] [place], [place] [address]    Culture ACINETOBACTER CALCOACETICUS/BAUMANNII COMPLEX (A)  Final   Report Status 04/28/2021 FINAL  Final   Organism ID, Bacteria ACINETOBACTER CALCOACETICUS/BAUMANNII COMPLEX  Final      Susceptibility   Acinetobacter calcoaceticus/baumannii complex - MIC*    CEFTAZIDIME 16 INTERMEDIATE Intermediate     CIPROFLOXACIN <=0.25 SENSITIVE Sensitive     GENTAMICIN <=1 SENSITIVE Sensitive     IMIPENEM <=0.25 SENSITIVE Sensitive     PIP/TAZO 32 INTERMEDIATE Intermediate     TRIMETH/SULFA <=20 SENSITIVE Sensitive     AMPICILLIN/SULBACTAM <=2 SENSITIVE Sensitive     * ACINETOBACTER CALCOACETICUS/BAUMANNII COMPLEX  Blood Culture ID Panel (Reflexed)     Status: Abnormal   Collection Time: 04/25/21  4:51 PM  Result Value Ref Range Status   Enterococcus faecalis NOT DETECTED NOT DETECTED Final   Enterococcus Faecium NOT DETECTED NOT DETECTED Final   Listeria monocytogenes NOT DETECTED NOT DETECTED Final   Staphylococcus species NOT DETECTED NOT DETECTED Final   Staphylococcus aureus (BCID) NOT DETECTED NOT DETECTED Final   Staphylococcus epidermidis NOT DETECTED NOT DETECTED Final   Staphylococcus lugdunensis NOT DETECTED NOT DETECTED Final   Streptococcus species NOT DETECTED NOT DETECTED Final   Streptococcus agalactiae NOT DETECTED NOT DETECTED Final   Streptococcus pneumoniae NOT DETECTED NOT DETECTED Final   Streptococcus pyogenes NOT DETECTED NOT DETECTED Final   A.calcoaceticus-baumannii DETECTED (A) NOT DETECTED Final    Comment: CRITICAL RESULT CALLED TO, READ BACK BY AND VERIFIED WITH: CHARLES SHANLEVER  AT 1014 04/26/21.PMF  Bacteroides fragilis NOT DETECTED NOT DETECTED Final   Enterobacterales NOT DETECTED NOT DETECTED Final   Enterobacter cloacae complex NOT DETECTED NOT DETECTED Final   Escherichia coli NOT DETECTED NOT DETECTED Final   Klebsiella aerogenes NOT DETECTED NOT DETECTED Final   Klebsiella oxytoca NOT DETECTED NOT DETECTED Final   Klebsiella pneumoniae NOT DETECTED NOT DETECTED Final   Proteus species NOT DETECTED NOT DETECTED Final   Salmonella species NOT DETECTED NOT DETECTED Final   Serratia marcescens NOT DETECTED NOT DETECTED Final   Haemophilus influenzae NOT DETECTED NOT DETECTED Final   Neisseria meningitidis NOT DETECTED NOT DETECTED Final   Pseudomonas aeruginosa NOT DETECTED NOT DETECTED Final   Stenotrophomonas maltophilia NOT DETECTED NOT DETECTED Final   Candida albicans NOT DETECTED NOT DETECTED Final   Candida auris NOT DETECTED NOT DETECTED Final   Candida glabrata NOT DETECTED NOT DETECTED Final   Candida krusei NOT DETECTED NOT DETECTED Final   Candida parapsilosis NOT DETECTED NOT DETECTED Final   Candida tropicalis NOT DETECTED NOT DETECTED Final   Cryptococcus neoformans/gattii NOT DETECTED NOT DETECTED Final   CTX-M ESBL NOT DETECTED NOT DETECTED Final   Carbapenem resistance IMP NOT DETECTED NOT DETECTED Final   Carbapenem resistance KPC NOT DETECTED NOT DETECTED Final   Carbapenem resistance NDM NOT DETECTED NOT DETECTED Final   Carbapenem resistance VIM NOT DETECTED NOT DETECTED Final    Comment: Performed at [facility], [address] [place], [place] [address]  Culture, blood (Routine x 2)     Status: Abnormal   Collection Time: 04/25/21  6:38 PM   Specimen: BLOOD  Result Value Ref Range Status   Specimen Description   Final    BLOOD BLOOD LEFT WRIST Performed at [facility], [address] [place], [place] [address]    Special Requests   Final    BOTTLES DRAWN AEROBIC AND ANAEROBIC Blood Culture adequate  volume Performed at [facility], [address] [place], [place] [address]    Culture  Setup Time   Final    GRAM VARIABLE COCCOBACILLI AEROBIC BOTTLE ONLY CRITICAL VALUE NOTED.  VALUE IS CONSISTENT WITH PREVIOUSLY REPORTED AND CALLED VALUE. Performed at [facility], [address] [place], [place] [address]    Culture (A)  Final    ACINETOBACTER CALCOACETICUS/BAUMANNII COMPLEX SUSCEPTIBILITIES PERFORMED ON PREVIOUS CULTURE WITHIN THE LAST 5 DAYS. Performed at [facility], [place] [address], [place], [place] [address]    Report Status 04/28/2021 FINAL  Final  Resp Panel by RT-PCR (Flu A&B, Covid) Nasopharyngeal Swab     Status: None   Collection Time: 04/25/21  6:54 PM   Specimen: Nasopharyngeal Swab; Nasopharyngeal(NP) swabs in vial transport medium  Result Value Ref Range Status   SARS Coronavirus 2 by RT PCR NEGATIVE NEGATIVE Final    Comment: (NOTE) SARS-CoV-2 target nucleic acids are NOT DETECTED.  The SARS-CoV-2 RNA is generally detectable in upper respiratory specimens during the acute phase of infection. The lowest concentration of SARS-CoV-2 viral copies this assay can detect is 138 copies/mL. A negative result does not preclude SARS-Cov-2 infection and should not be used as the sole basis for treatment or other patient management decisions. A negative result may occur with  improper specimen collection/handling, submission of specimen other than nasopharyngeal swab, presence of viral mutation(s) within the areas targeted by this assay, and inadequate number of viral copies(<138 copies/mL). A negative result must be combined with clinical observations, patient history, and epidemiological information. The expected  result is Negative.  Fact Sheet for Patients:  EntrepreneurPulse.com.au  Fact Sheet for Healthcare Providers:  IncredibleEmployment.be  This test is no t yet approved or cleared by the [place] FDA and  has been authorized for detection and/or diagnosis of SARS-CoV-2 by FDA under an Emergency Use Authorization (EUA). This EUA will remain  in effect (meaning this test can be used) for the duration of the COVID-19 declaration under Section 564(b)(1) of the Act, 21 U.S.C.section 360bbb-3(b)(1), unless the authorization is terminated  or revoked sooner.       Influenza A by PCR NEGATIVE NEGATIVE Final   Influenza B by PCR NEGATIVE NEGATIVE Final    Comment: (NOTE) The Xpert Xpress SARS-CoV-2/FLU/RSV plus assay is intended as an aid in the diagnosis of influenza from Nasopharyngeal swab specimens and should not be used as a sole basis for treatment. Nasal washings and aspirates are unacceptable for Xpert Xpress SARS-CoV-2/FLU/RSV testing.  Fact Sheet for Patients: EntrepreneurPulse.com.au  Fact Sheet for Healthcare Providers: IncredibleEmployment.be  This test is not yet approved or cleared by the [place] FDA and has been authorized for detection and/or diagnosis of SARS-CoV-2 by FDA under an Emergency Use Authorization (EUA). This EUA will remain in effect (meaning this test can be used) for the duration of the COVID-19 declaration under Section 564(b)(1) of the Act, 21 U.S.C. section 360bbb-3(b)(1), unless the authorization is terminated or revoked.  Performed at [facility], [address] [place], [place] [address]   MRSA Next Gen by PCR, Nasal     Status: None   Collection Time: 04/26/21  6:35 PM   Specimen: Nasal Mucosa; Nasal Swab  Result Value Ref Range Status   MRSA by PCR Next Gen NOT DETECTED NOT DETECTED Final    Comment: (NOTE) The GeneXpert MRSA Assay (FDA approved for NASAL specimens only), is one component of a comprehensive MRSA colonization surveillance program. It is not intended to diagnose MRSA infection nor to guide or monitor treatment for MRSA infections. Test performance is not FDA  approved in patients less than 18 years old. Performed at [facility], [address], [place], [place] [address]   Cath Tip Culture     Status: None (Preliminary result)   Collection Time: 04/27/21 11:59 AM   Specimen: Catheter Tip; Other  Result Value Ref Range Status   Specimen Description   Final    CATH TIP Performed at [facility], [address], [place], [place] [address]    Special Requests   Final    NONE Performed at [facility], [address] [place], [place] [address]    Culture   Final    NO GROWTH 2 DAYS Performed at [facility], [address] [place], [place] [address]    Report Status PENDING  Incomplete  CULTURE, BLOOD (ROUTINE X 2) w Reflex to ID Panel     Status: None (Preliminary result)   Collection Time: 04/28/21 11:34 AM   Specimen: BLOOD  Result Value Ref Range Status   Specimen Description BLOOD RIGHT AC  Final   Special Requests   Final    BOTTLES DRAWN AEROBIC ONLY Blood Culture results may not be optimal due to an inadequate volume of blood received in culture bottles   Culture   Final    NO GROWTH 2 DAYS Performed at [facility], [address], [place], [place] [address]    Report Status PENDING  Incomplete  CULTURE, BLOOD (ROUTINE X 2)  w Reflex to ID Panel     Status: None (Preliminary result)   Collection Time: 04/28/21  5:27 PM   Specimen: BLOOD  Result Value Ref Range Status   Specimen Description BLOOD LT FINGER IN HAND  Final   Special Requests BOTTLES DRAWN AEROBIC ONLY  BCAV  Final   Culture   Final    NO GROWTH 2 DAYS Performed at [facility], [address] [place], [place] [address]    Report Status PENDING  Incomplete    Coagulation Studies: No results for input(s): LABPROT, INR in the last 72 hours.   Urinalysis: No results for input(s): COLORURINE, LABSPEC, PHURINE, GLUCOSEU, HGBUR, BILIRUBINUR, KETONESUR, PROTEINUR, UROBILINOGEN, NITRITE, LEUKOCYTESUR in  the last 72 hours.  Invalid input(s): APPERANCEUR    Imaging: DG Abd 1 View  Result Date: 04/29/2021 CLINICAL DATA:  Enteric tube placement. EXAM: ABDOMEN - 1 VIEW COMPARISON:  Abdominal x-ray dated April 26, 2021. FINDINGS: Enteric tube in the stomach with tip near the distal body. The bowel gas pattern is normal. Small bilateral pleural effusions. IMPRESSION: 1. Enteric tube in the stomach. Electronically Signed   By: Titus Dubin M.D.   On: 04/29/2021 14:21   CT HEAD WO CONTRAST (5MM)  Result Date: 04/29/2021 CLINICAL DATA:  Delirium.  Sepsis. EXAM: CT HEAD WITHOUT CONTRAST TECHNIQUE: Contiguous axial images were obtained from the base of the skull through the vertex without intravenous contrast. COMPARISON:  CT head without contrast 03/29/2021 FINDINGS: Brain: No acute infarct, hemorrhage, or mass lesion is present. The ventricles are of normal size. No significant extraaxial fluid collection is present. Vascular: Atherosclerotic calcifications are present in the cavernous internal carotid arteries bilaterally. No hyperdense vessel is present. Skull: Calvarium is intact. No focal lytic or blastic lesions are present. No significant extracranial soft tissue lesion is present. Sinuses/Orbits: Small fluid level is present in the left sphenoid sinus. Asymmetric fluid is present in the left nasal cavity associated with the NG tube. Sinuses are otherwise clear. Fluid is present in the left mastoid air cells. No obstructing nasopharyngeal lesion is present. Bilateral lens replacements are noted. Globes and orbits are otherwise unremarkable. Other: Healed IMPRESSION: 1. Normal CT appearance of the brain. 2. Small fluid level in the left sphenoid sinus. 3. Fluid in the left mastoid air cells and left nasal cavity associated with the NG tube. No obstructing nasopharyngeal lesion is present. Electronically Signed   By: San Morelle M.D.   On: 04/29/2021 15:35   US Abdomen Limited RUQ  (LIVER/GB)  Result Date: 04/28/2021 CLINICAL DATA:  Right upper quadrant pain EXAM: ULTRASOUND ABDOMEN LIMITED RIGHT UPPER QUADRANT COMPARISON:  CT 04/25/2021 FINDINGS: Gallbladder: Surgically absent Common bile duct: Diameter: 5.9 mm Liver: Coarse hepatic echotexture with possible contour nodularity. No focal hepatic abnormality. Portal vein is patent on color Doppler imaging with normal direction of blood flow towards the liver. Other: Small amount of perihepatic ascites.  Atrophic right kidney. IMPRESSION: 1. Status post cholecystectomy.  No biliary dilatation 2. Coarse hepatic echotexture with questionable nodularity suggesting underlying hepatic parenchymal disease and potential early cirrhosis changes. Small amount of perihepatic ascites. Electronically Signed   By: Donavan Foil M.D.   On: 04/28/2021 22:36     Medications:    ampicillin-sulbactam (UNASYN) IV     dexmedetomidine (PRECEDEX) IV infusion Stopped (04/30/21 0128)   feeding supplement (OSMOLITE 1.5 CAL) 1,000 mL (04/29/21 1252)    atorvastatin  20 mg Per Tube QPM   chlorhexidine  15 mL Mouth Rinse BID  Chlorhexidine Gluconate Cloth  6 each Topical Q0600   cholecalciferol  1,000 Units Per Tube Daily   feeding supplement (PROSource TF)  45 mL Per Tube BID   fentaNYL  1 patch Transdermal Q72H   free water  30 mL Per Tube Q4H   heparin  5,000 Units Subcutaneous Q8H   lipase/protease/amylase  36,000 Units Oral TID AC   mouth rinse  15 mL Mouth Rinse q12n4p   midodrine  10 mg Per Tube TID WC   multivitamin  1 tablet Per Tube QHS   nutrition supplement (JUVEN)  1 packet Per Tube BID BM   sevelamer carbonate  800 mg Per Tube TID WC & HS   silver sulfADIAZINE   Topical BID   cyanocobalamin  1,000 mcg Per Tube Daily   acetaminophen **OR** acetaminophen, albuterol, dextrose, docusate sodium, fentaNYL (SUBLIMAZE) injection, heparin sodium (porcine), polyethylene glycol  Assessment/ Plan:  [name] is a 60 y.o. black female  with end stage renal disease on hemodialysis for greater than 14 years, hypertension, congestive heart failure, coronary artery disease, diabetes mellitus type II, who is admitted to [facility] on 04/25/2021 for Sepsis ([place]) [A41.9] Severe sepsis ([place]) [A41.9, R65.20] Fever in other diseases [R50.81]   [facility] MWF Lt Permcath 74kg   End Stage Renal Disease on dialysis: CRRT d/c on 8/8; patient is off pressors.   HD/CRRT via temp cath Extra HD today to optimize fluid/volume Next treatment planned for wednesday  2. Sepsis: with hypotension and fever.  - permcath removed, tip cultures are pending- neg so far - Acinetobacter bacteremia   - off vasopressors; critical care as above.    3. Anemia of chronic kidney disease  Lab Results  Component Value Date   HGB 9.2 (L) 04/30/2021   Scheduled EPO SQ weekly saturday   4. Secondary Hyperparathyroidism:    Lab Results  Component Value Date   PTH 357 (H) 10/05/2018   CALCIUM 8.2 (L) 04/30/2021   CAION 1.09 (L) 04/13/2020   PHOS 2.2 (L) 04/30/2021       Hold phosphate binders until able to have oral intake.   5.  Hypokalemia In normal range today  6. Volume overload - Volume removal IHD as tolerated    LOS: 5 Lakara Weiland 8/9/20228:48 AM

## 2021-04-30 NOTE — Progress Notes (Signed)
Pre HD RN assessment 

## 2021-04-30 NOTE — Progress Notes (Signed)
Patients IV fell out, unable to obtain new IV. This RN and IV team both tried. Benjamine Mola RN aware. Hold Unasyn IV.

## 2021-04-30 NOTE — Consult Note (Signed)
PHARMACY CONSULT NOTE  Pharmacy Consult for Electrolyte Monitoring and Replacement   Recent Labs: Potassium (mmol/L)  Date Value  04/30/2021 3.8  09/15/2014 4.8   Magnesium (mg/dL)  Date Value  04/29/2021 1.9  07/19/2013 1.9   Calcium (mg/dL)  Date Value  04/30/2021 8.2 (L)   Calcium, Total (mg/dL)  Date Value  09/15/2014 8.3 (L)   Albumin (g/dL)  Date Value  04/30/2021 2.4 (L)  09/15/2014 3.0 (L)   Phosphorus (mg/dL)  Date Value  04/30/2021 2.2 (L)  09/16/2013 4.6   Sodium (mmol/L)  Date Value  04/30/2021 133 (L)  09/15/2014 135 (L)    Assessment: Patient is a 60 y/o F with medical history including ESRD on HD, PAD, COPD, CAD, diabetes, gastroparesis, history of pancreatitis, diastolic HF who is admitted with septic shock secondary to acinetobacter bacteremia. Patient was briefly intubated 8/5 - 8/7. She has been started on CRRT for hemodynamic instability. Pharmacy consulted to assist with electrolyte monitoring and replacement as indicated.    Goal of Therapy:  Electrolytes within normal limits  Plan:  --Phos 2.2, will follow up tomorrow --Continue to follow  Tawnya Crook, PharmD, BCPS Clinical Pharmacist 04/30/2021 2:20 PM

## 2021-04-30 NOTE — Progress Notes (Signed)
HD end at 1228. Pt responds to voice, stable, met 2L uf goal, catheter limbs locked with heparin 1.51m each, report to primary RN.

## 2021-04-30 NOTE — Evaluation (Signed)
Clinical/Bedside Swallow Evaluation Patient Details  Name: [name] MRN: [id number] Date of Birth: 03/21/1961  Today's Date: 04/30/2021 Time: SLP Start Time (ACUTE ONLY): 0840 SLP Stop Time (ACUTE ONLY): 0940 SLP Time Calculation (min) (ACUTE ONLY): 60 min  Past Medical History:  Past Medical History:  Diagnosis Date   Anemia    Anginal pain ([place])    Anxiety    Arthritis    Asthma    Broken wrist    Bronchitis    chronic diastolic CHF 11/20/6008   Chronic kidney disease    COPD (chronic obstructive pulmonary disease) ([place])    Coronary artery disease    a. cath 2013: stenting to RCA (report not available); b. cath 2014: LM nl, pLAD 40%, mLAD nl, ost LCx 40%, mid LCx nl, pRCA 30% @ site of prior stent, mRCA 50%   Depression    Diabetes mellitus ([place])    Diabetes mellitus without complication ([place])    Diabetic neuropathy ([place])    dialysis 2006   Diverticulosis    Dizziness    Dyspnea    Elevated lipids    Environmental and seasonal allergies    ESRD (end stage renal disease) on dialysis ([place])    M-W-F   Gastroparesis    GERD (gastroesophageal reflux disease)    Headache    History of anemia due to chronic kidney disease    History of hiatal hernia    HOH (hard of hearing)    Hx of pancreatitis 2015   Hypertension    Lower extremity edema    Mitral regurgitation    a. echo 10/2013: EF 62%, noWMA, mildly dilated LA, mild to mod MR/TR, GR1DD   Myocardial infarction ([place])    Orthopnea    Parathyroid abnormality ([place])    Peripheral arterial disease ([place])    Pneumonia    Renal cancer ([place])    Renal insufficiency    Pt is on dialysis on M,W + F.   Wheezing    Past Surgical History:  Past Surgical History:  Procedure Laterality Date   A/V SHUNTOGRAM Left 01/20/2018   Procedure: A/V SHUNTOGRAM;  Surgeon: Algernon Huxley, MD;  Location: [facility];  Service: Cardiovascular;  Laterality: Left;   ABDOMINAL HYSTERECTOMY  1992   AMPUTATION TOE Left 10/02/2017    Procedure: AMPUTATION TOE-LEFT GREAT TOE;  Surgeon: Albertine Patricia, DPM;  Location: [facility];  Service: Podiatry;  Laterality: Left;   APPENDECTOMY     APPLICATION OF WOUND VAC N/A 11/25/2019   Procedure: APPLICATION OF WOUND VAC;  Surgeon: Katha Cabal, MD;  Location: [facility];  Service: Vascular;  Laterality: N/A;   ARTERY BIOPSY Right 10/11/2018   Procedure: BIOPSY TEMPORAL ARTERY;  Surgeon: Vickie Epley, MD;  Location: [facility];  Service: General;  Laterality: Right;   CARDIAC CATHETERIZATION Left 07/26/2015   Procedure: Left Heart Cath and Coronary Angiography;  Surgeon: Dionisio David, MD;  Location: [place] [facility];  Service: Cardiovascular;  Laterality: Left;   CATARACT EXTRACTION W/ INTRAOCULAR LENS IMPLANT Right    CATARACT EXTRACTION W/PHACO Left 03/10/2017   Procedure: CATARACT EXTRACTION PHACO AND INTRAOCULAR LENS PLACEMENT ([place]);  Surgeon: Birder Robson, MD;  Location: [facility];  Service: Ophthalmology;  Laterality: Left;  [place] 00:51.9 AP% 14.2 CDE 7.39 fluid pack lot # [id number]   CENTRAL LINE INSERTION Right 11/11/2019   Procedure: CENTRAL LINE INSERTION;  Surgeon: Katha Cabal, MD;  Location: [facility];  Service: Vascular;  Laterality: Right;  CENTRAL LINE INSERTION  11/25/2019   Procedure: CENTRAL LINE INSERTION;  Surgeon: Katha Cabal, MD;  Location: [facility];  Service: Vascular;;   CHOLECYSTECTOMY     COLONOSCOPY WITH PROPOFOL N/A 08/12/2016   Procedure: COLONOSCOPY WITH PROPOFOL;  Surgeon: Lollie Sails, MD;  Location: [facility];  Service: Endoscopy;  Laterality: N/A;   DIALYSIS FISTULA CREATION Left    upper arm   dialysis grafts     DIALYSIS/PERMA CATHETER INSERTION N/A 11/14/2019   Procedure: DIALYSIS/PERMA CATHETER INSERTION;  Surgeon: Algernon Huxley, MD;  Location: [place] [facility];  Service: Cardiovascular;  Laterality: N/A;   DIALYSIS/PERMA CATHETER INSERTION N/A 02/03/2020   Procedure: DIALYSIS/PERMA CATHETER INSERTION;  Surgeon:  Katha Cabal, MD;  Location: [place] [facility];  Service: Cardiovascular;  Laterality: N/A;   DIALYSIS/PERMA CATHETER INSERTION N/A 06/19/2020   Procedure: DIALYSIS/PERMA CATHETER INSERTION;  Surgeon: Katha Cabal, MD;  Location: [place] [facility];  Service: Cardiovascular;  Laterality: N/A;   DIALYSIS/PERMA CATHETER REMOVAL N/A 05/25/2020   Procedure: DIALYSIS/PERMA CATHETER REMOVAL;  Surgeon: Katha Cabal, MD;  Location: [facility];  Service: Cardiovascular;  Laterality: N/A;   ESOPHAGOGASTRODUODENOSCOPY N/A 03/08/2015   Procedure: ESOPHAGOGASTRODUODENOSCOPY (EGD);  Surgeon: Manya Silvas, MD;  Location: [facility] ENDOSCOPY;  Service: Endoscopy;  Laterality: N/A;   ESOPHAGOGASTRODUODENOSCOPY (EGD) WITH PROPOFOL N/A 03/18/2016   Procedure: ESOPHAGOGASTRODUODENOSCOPY (EGD) WITH PROPOFOL;  Surgeon: [name], MD;  Location: [facility];  Service: Endoscopy;  Laterality: N/A;   EYE SURGERY Right 2018   FECAL TRANSPLANT N/A 08/23/2015   Procedure: FECAL TRANSPLANT;  Surgeon: Manya Silvas, MD;  Location[facility] ENDOSCOPY;  Service: Endoscopy;  Laterality: N/A;   HAND SURGERY Bilateral    HEMATOMA EVACUATION Left 11/25/2019   Procedure: EVACUATION HEMATOMA;  Surgeon: Katha Cabal, MD;  Location: [facility];  Service: Vascular;  Laterality: Left;   I & D EXTREMITY Left 11/25/2019   Procedure: IRRIGATION AND DEBRIDEMENT EXTREMITY;  Surgeon: Katha Cabal, MD;  Location: [facility];  Service: Vascular;  Laterality: Left;   IR FLUORO GUIDE CV LINE RIGHT  04/06/2020   IR INJECT/THERA/INC NEEDLE/CATH/PLC EPI/CERV/THOR W/IMG  08/13/2020   IR RADIOLOGIST EVAL & MGMT  07/28/2019   IR RADIOLOGIST EVAL & MGMT  08/11/2019   LIGATION OF ARTERIOVENOUS  FISTULA Left 11/11/2019   LIGATION OF ARTERIOVENOUS  FISTULA Left 11/11/2019   Procedure: LIGATION OF ARTERIOVENOUS  FISTULA;  Surgeon: Katha Cabal, MD;  Location: [facility];  Service: Vascular;  Laterality: Left;    LIGATIONS OF HERO GRAFT Right 06/13/2020   Procedure: LIGATION / REMOVAL OF RIGHT HERO GRAFT;  Surgeon: Katha Cabal, MD;  Location: [facility];  Service: Vascular;  Laterality: Right;   PERIPHERAL VASCULAR CATHETERIZATION N/A 12/20/2015   Procedure: Thrombectomy of dialysis access versus permcath placement;  Surgeon: Algernon Huxley, MD;  Location: [place] [facility];  Service: Cardiovascular;  Laterality: N/A;   PERIPHERAL VASCULAR CATHETERIZATION N/A 12/20/2015   Procedure: A/V Shunt Intervention;  Surgeon: Algernon Huxley, MD;  Location: [place] [facility];  Service: Cardiovascular;  Laterality: N/A;   PERIPHERAL VASCULAR CATHETERIZATION N/A 12/20/2015   Procedure: A/V Shuntogram/Fistulagram;  Surgeon: Algernon Huxley, MD;  Location: [place] [facility];  Service: Cardiovascular;  Laterality: N/A;   PERIPHERAL VASCULAR CATHETERIZATION N/A 01/02/2016   Procedure: A/V Shuntogram/Fistulagram;  Surgeon: Algernon Huxley, MD;  Location: [place] [facility];  Service: Cardiovascular;  Laterality: N/A;   PERIPHERAL VASCULAR CATHETERIZATION N/A 01/02/2016  Procedure: A/V Shunt Intervention;  Surgeon: Algernon Huxley, MD;  Location: [place] [facility];  Service: Cardiovascular;  Laterality: N/A;   TEE WITHOUT CARDIOVERSION N/A 06/11/2020   Procedure: TRANSESOPHAGEAL ECHOCARDIOGRAM (TEE);  Surgeon: Teodoro Spray, MD;  Location: [facility];  Service: Cardiovascular;  Laterality: N/A;   UPPER EXTREMITY ANGIOGRAPHY Bilateral 11/27/2020   Procedure: UPPER EXTREMITY ANGIOGRAPHY;  Surgeon: Katha Cabal, MD;  Location: [place] [facility];  Service: Cardiovascular;  Laterality: Bilateral;   UPPER EXTREMITY VENOGRAPHY Right 01/18/2020   Procedure: UPPER EXTREMITY VENOGRAPHY;  Surgeon: Katha Cabal, MD;  Location: [place] [facility];  Service: Cardiovascular;  Laterality: Right;   UPPER EXTREMITY VENOGRAPHY Bilateral 11/27/2020   Procedure: UPPER EXTREMITY VENOGRAPHY;  Surgeon: Katha Cabal, MD;  Location: [place] [facility];  Service: Cardiovascular;  Laterality: Bilateral;   VASCULAR ACCESS DEVICE INSERTION Right 04/13/2020   Procedure: INSERTION OF HERO VASCULAR ACCESS DEVICE (GRAFT);  Surgeon: Katha Cabal, MD;  Location: [facility];  Service: Vascular;  Laterality: Right;   HPI:  Pt  is a 60 y.o. female with medical history significant for end-stage renal disease on hemodialysis on Monday, Wednesday, Friday schedule, diabetes mellitus complicated by peripheral polyneuropathy and gastroparesis,, hyperlipidemia, anemia of chronic kidney disease with baseline hemoglobin 8-10, hypertension, HIatal Hernia who is admitted to [facility] on 04/25/2021 with severe sepsis after presenting from home. Provision of history is limited by the patient's somnolence.  The patient reports 2 days of subjective fever, with temperature max at home over that time of 103 F.  She notes associated crampy abdominal discomfort in the bilateral lower abdominal quadrants, which she states is similar in distribution, quality, and intensity relative to the abdominal discomfort that she was experiencing at the time of hospitalization to [facility] ED approximately 1 month ago at which time she was diagnosed with ESBL Ecoli urinary tract infection.  Patient presented with altered mental status, fever and severe sepsis with septic shock.  Patient was intubated upon admission.  She had Acinetobacter bacteremia and infected permacath.  Pt was orally intubated/extubated on 04/28/2021. Noted pt had a Palliative Care consult scheduled Outpatient at Home prior to this admit.  Assessment / Plan / Recommendation Clinical Impression  Pt was seen today for BSE. She was poorly alert; she is encephalopathic and remains on low dose precedex gtt due to intermittent agitation. NGT present for nutritional support. Pt mumbled/moaned and turned head side to side to gentle stim and voice but was unable to follow any commands or verbalize. Pt was  positioned upright in bed as HD NSG arrived to set up for HD w/ pt.  Oral care was performed focusing on cleaning then oral stimulation using sponge swabs; gloved finger w/ oral gel. Noted redness, erythema, and dryness on lips. During oral cleaning, then moisturizing w/ oral gel, noted lingual response to stim: tongue retraction and bunching; posterior lingual tension/strength; licking of lips intermittently. However, she returned to an open-mouth posture w/ lingual extension at rest most often. Pt exhibited increased head turning and moaning x2 w/ the increased tactile stim. A pharyngeal swallow was noted x1. Post oral care/moisterizing, session stopped to not overly irritate/stress pt. Pt remained nonverbal and did not follow any commands during the tx despite Max cues.    Due to pt's presentation and lethargy, she remains at a High risk for aspiration if given any oral intake. Recommend strict NPO status w/ frequent oral care for oral hygiene, awareness, and stimulation  of swallowing.  ST services will f/u daily for readiness for full assessment w/ po trials in hopes to re-establish a safe oral diet. NSG/Dietician updated. Palliative Care ordered this visit.  SLP Visit Diagnosis: Dysphagia, oropharyngeal phase (R13.12)    Aspiration Risk  Severe aspiration risk;Risk for inadequate nutrition/hydration    Diet Recommendation  NPO w/ frequent oral care for hygiene and stimulation of swallowing; aspiration precautions  Medication Administration: Via alternative means    Other  Recommendations Recommended Consults:  (Dietician f/u) Oral Care Recommendations: Oral care QID;Staff/trained caregiver to provide oral care Other Recommendations:  (TBD)   Follow up Recommendations Skilled Nursing facility (TBD)      Frequency and Duration min 3x week  2 weeks       Prognosis Prognosis for Safe Diet Advancement: Guarded (at this time) Barriers to Reach Goals: Severity of deficits Barriers/Prognosis  Comment: remains on precedex currently      Swallow Study   General Date of Onset: 04/25/21 HPI: Pt  is a 60 y.o. female with medical history significant for end-stage renal disease on hemodialysis on Monday, Wednesday, Friday schedule, diabetes mellitus complicated by peripheral polyneuropathy and gastroparesis,, hyperlipidemia, anemia of chronic kidney disease with baseline hemoglobin 8-10, hypertension, HIatal Hernia who is admitted to [facility] on 04/25/2021 with severe sepsis after presenting from home. Provision of history is limited by the patient's somnolence.  The patient reports 2 days of subjective fever, with temperature max at home over that time of 103 F.  She notes associated crampy abdominal discomfort in the bilateral lower abdominal quadrants, which she states is similar in distribution, quality, and intensity relative to the abdominal discomfort that she was experiencing at the time of hospitalization to [facility] ED approximately 1 month ago at which time she was diagnosed with ESBL Ecoli urinary tract infection.  Patient presented with altered mental status, fever and severe sepsis with septic shock.  Patient was intubated upon admission.  She had Acinetobacter bacteremia and infected permacath.  Pt was orally intubated/extubated on 04/28/2021. Type of Study: Bedside Swallow Evaluation Previous Swallow Assessment: 2017; 2021 Diet Prior to this Study:  (NPO currently; had been d/c'd previously on a regular/mech soft meats diet w/ thin liquids) Temperature Spikes Noted: No (wbc 7.0) Respiratory Status: Nasal cannula (2L) History of Recent Intubation: Yes Length of Intubations (days): 4 days Date extubated: 04/28/21 Behavior/Cognition: Lethargic/Drowsy;Doesn't follow directions Oral Cavity Assessment: Erythema;Dry Oral Care Completed by SLP: Yes Oral Cavity - Dentition: Edentulous (has dentures at home(previously)) Vision:  (n/a) Self-Feeding Abilities: Total  assist Patient Positioning: Upright in bed (positioned by SLP) Baseline Vocal Quality: Not observed (lethargic) Volitional Cough: Cognitively unable to elicit Volitional Swallow: Unable to elicit    Oral/Motor/Sensory Function Overall Oral Motor/Sensory Function:  (could not assess d/t pt's lethargy)   Ice Chips Ice chips: Not tested   Thin Liquid Thin Liquid: Not tested    Nectar Thick Nectar Thick Liquid: Not tested   Honey Thick Honey Thick Liquid: Not tested   Puree Puree: Not tested   Solid     Solid: Not tested         [name], MS, CCC-SLP Speech Language Pathologist Rehab Services [phone] Aletta Edmunds 04/30/2021,10:17 AM

## 2021-04-30 NOTE — Progress Notes (Signed)
NAME:  [name], MRN:  [id number], DOB:  July 31, 1961, LOS: 5 ADMISSION DATE:  04/25/2021, CONSULTATION DATE: 04/26/2021 REFERRING MD: Dr. Babs Bertin ,DO CHIEF COMPLAINT: Severe sepsis/septic shock  History of Present Illness:  Patient presented with altered mental status, fever and severe sepsis with septic shock.  Patient was intubated upon admission.  She had Acinetobacter bacteremia and infected permacath.  Pertinent  Medical History  ESRD dialysis MWF Coronary artery disease with chronic angina Diabetes mellitus insulin requiring Diabetic neuropathy Diabetic gastroparesis GERD Hiatal hernia Moderate mitral regurgitation Chronic intermittent abdominal pain  Significant Hospital Events: Including procedures, antibiotic start and stop dates in addition to other pertinent events   04/27/21- Tunneled vasc cath removed from left chest.  Reviewed care plan with son [name]. Patient on CRRT.  S/p vascular and renal evanluatoin . 04/28/21- patient is performing well on SBT plan for liberation from MV today.  04/29/2021-persistent encephalopathy, anemia, transfuse 1 PRBC, palliative care consult placed, Precedex initiated 04/30/2021-pt remains on low dose precedex gtt due to intermittent agitation   Interim History / Subjective:  Pt remains encephalopathic with intermittent agitation requiring precedex gtt  Objective   Blood pressure (!) 145/64, pulse 73, temperature (!) 97.3 F (36.3 C), resp. rate 19, height 5\' 5"  (1.651 m), weight 80.6 kg, SpO2 98 %.  Intake/Output Summary (Last 24 hours) at 04/30/2021 0829 Last data filed at 04/30/2021 0600 Gross per 24 hour  Intake 1681.31 ml  Output 1210 ml  Net 471.31 ml   Filed Weights   04/28/21 0451 04/29/21 0454 04/30/21 0500  Weight: 83.3 kg 83.1 kg 80.6 kg    Examination: GENERAL: Chronically ill appearing female, NAD occasionally yelling "help me" HEAD: Normocephalic, atraumatic.  EYES: Right pupil 2 mm reactive, left pupil irregular  sluggish  MOUTH: Mildly dry  NECK: Supple. Trachea midline. No JVD.  PULMONARY: Diminished with expiratory wheezes throughout, even, non labored  CARDIOVASCULAR: S1 and S2. Regular rate and rhythm. 2+ generalized edema  ABDOMEN: +BS x4, soft, non distended MUSCULOSKELETAL: Moves all extremities  NEUROLOGIC: Encephalopathic, following commands  SKIN: Bilateral foot wounds dressings dry and intact with chronic vascular changes/discoloration  PSYCH: Encephalopathic, delirious.  Resolved Hospital Problem list   Ventilator dependent respiratory failure  Assessment & Plan:  Severe sepsis/septic shock Acinetobacter bacteremia Culprit: Infected permacath -Permacath removed -ID consulted appreciate input~per recommendations  -Right femoral temporary dialysis catheter in place  Severe acute metabolic encephalopathy and severe sepsis~CT Head 08/8 revealed  normal CT appearance of the brain. Small fluid level in the left sphenoid sinus. Fluid in the left mastoid air cells and left nasal cavity associated with the NG tube. No obstructing nasopharyngeal lesion is present. -Prn precedex gtt for intermittent agitation  -Will resume outpatient remeron   Chronic abdominal pain~Abd x-ray 08/8 revealed bowel gas pattern normal -Continue fentanyl patch  -Prn fentanyl for pain management   ESRD on HD -Trend BMP  -Replace electrolytes as indicated  -Nephrology consulted appreciate input~HD per recommendations   Best Practice (right click and "Reselect all SmartList Selections" daily)   Diet/type: Tube feeds  DVT prophylaxis: prophylactic heparin  GI prophylaxis: N/A Lines: Dialysis Catheter right femoral vein, still needed Foley:  N/A Code Status:  full code Last date of multidisciplinary goals of care discussion (04/30/2021)  Labs   CBC: Recent Labs  Lab 04/25/21 1651 04/26/21 0415 04/27/21 0417 04/28/21 0447 04/28/21 1718 04/29/21 0411 04/29/21 1318 04/30/21 0512  WBC 5.7 4.1 11.3*  10.2  --  6.2  --  7.0  NEUTROABS 4.5 3.4  --   --   --   --   --   --   HGB 9.1* 8.1* 7.7* 7.0* 7.5* 7.1* 8.6* 9.2*  HCT 28.8* 25.5* 24.1* 21.4* 23.7* 21.9* 26.6* 29.2*  MCV 89.7 91.1 88.3 87.3  --  90.1  --  88.5  PLT 245 188 149* 140*  --  115*  --  107*    Basic Metabolic Panel: Recent Labs  Lab 04/25/21 1838 04/26/21 0415 04/26/21 1932 04/27/21 0417 04/27/21 1608 04/28/21 0447 04/28/21 1718 04/29/21 0411 04/30/21 0512  NA  --  131*  --    < > 133* 133* 133* 132* 133*  K  --  5.0  --    < > 3.8 2.8* 3.1* 3.1* 3.8  CL  --  100  --    < > 100 101 101 101 99  CO2  --  24  --    < > 25 27 27 28 26   GLUCOSE  --  103*  --    < > 106* 114* 155* 145* 100*  BUN  --  18  --    < > 10 6* 6* 6* 10  CREATININE  --  4.04*  --    < > 2.04* 1.27* 1.15* 1.10* 1.59*  CALCIUM  --  7.0*  --    < > 8.3* 8.5* 8.8* 8.3* 8.2*  MG 1.7 1.6* 1.6*  --   --  1.7  --  1.9  --   PHOS  --  3.9  --    < > 2.0* 1.5* 2.1* 2.0* 2.2*   < > = values in this interval not displayed.   GFR: Estimated Creatinine Clearance: 38.9 mL/min (A) (by C-G formula based on SCr of 1.59 mg/dL (H)). Recent Labs  Lab 04/25/21 1651 04/25/21 1838 04/26/21 0415 04/27/21 0417 04/28/21 0447 04/29/21 0411 04/30/21 0512  PROCALCITON  --  0.81 11.70 13.35  --   --   --   WBC 5.7  --  4.1 11.3* 10.2 6.2 7.0  LATICACIDVEN 1.4 1.1  --   --   --   --   --     Liver Function Tests: Recent Labs  Lab 04/25/21 1651 04/26/21 0415 04/27/21 0417 04/27/21 1608 04/28/21 0447 04/28/21 1718 04/29/21 0411 04/30/21 0512  AST 23 23  --   --   --   --  16  --   ALT 14 11  --   --   --   --  13  --   ALKPHOS 137* 100  --   --   --   --  140*  --   BILITOT 0.5 0.8  --   --   --   --  0.6  --   PROT 7.6 5.5*  --   --   --   --  6.0*  --   ALBUMIN 2.3* 1.6*   < > 2.2* 2.3* 2.9* 2.9*  2.6* 2.4*   < > = values in this interval not displayed.   Recent Labs  Lab 04/25/21 1651  LIPASE 20   Recent Labs  Lab 04/26/21 0415   AMMONIA 55*    ABG    Component Value Date/Time   PHART 7.37 04/26/2021 1611   PCO2ART 38 04/26/2021 1611   PO2ART 220 (H) 04/26/2021 1611   HCO3 22.0 04/26/2021 1611   TCO2 32 04/13/2020 0705   ACIDBASEDEF 3.0 (H) 04/26/2021 1611   O2SAT 99.8  04/26/2021 1611     Coagulation Profile: Recent Labs  Lab 04/25/21 1651  INR 1.2    Cardiac Enzymes: No results for input(s): CKTOTAL, CKMB, CKMBINDEX, TROPONINI in the last 168 hours.  HbA1C: Hemoglobin A1C  Date/Time Value Ref Range Status  07/19/2013 12:10 AM 9.3 (H) 4.2 - 6.3 % Final    Comment:    The American Diabetes Association recommends that a primary goal of therapy should be <7% and that physicians should reevaluate the treatment regimen in patients with HbA1c values consistently >8%.   04/01/2013 04:37 AM 7.6 (H) 4.2 - 6.3 % Final    Comment:    The American Diabetes Association recommends that a primary goal of therapy should be <7% and that physicians should reevaluate the treatment regimen in patients with HbA1c values consistently >8%.    Hgb A1c MFr Bld  Date/Time Value Ref Range Status  03/29/2021 02:19 AM 6.2 (H) 4.8 - 5.6 % Final    Comment:    (NOTE) Pre diabetes:          5.7%-6.4%  Diabetes:              >6.4%  Glycemic control for   <7.0% adults with diabetes   12/07/2020 05:00 AM 4.6 (L) 4.8 - 5.6 % Final    Comment:    (NOTE) Pre diabetes:          5.7%-6.4%  Diabetes:              >6.4%  Glycemic control for   <7.0% adults with diabetes     CBG: Recent Labs  Lab 04/29/21 1448 04/29/21 1940 04/29/21 2346 04/30/21 0357 04/30/21 0740  GLUCAP 93 113* 108* 97 121*    Review of Systems:   Unable to obtain due to the patient's encephalopathy.  Allergies Allergies  Allergen Reactions   Ace Inhibitors Swelling and Anaphylaxis   Ativan [Lorazepam] Other (See Comments)    Reaction:  Hallucinations and headaches   Compazine [Prochlorperazine Edisylate] Anaphylaxis, Nausea And  Vomiting and Other (See Comments)    Other reaction(s): dystonia from this vs. Reglan, 23 Jul - patient relates that she takes promethazine frequently with no problems   Sumatriptan Succinate Other (See Comments)    Other reaction(s): delirium and hallucinations per [facility] records   Zofran [Ondansetron] Nausea And Vomiting    Per pt. she is allergic to zofran or will experience adverse reaction like hallucination    Losartan Nausea Only   Prochlorperazine Other (See Comments)    Reaction:  Unknown . Patient does not remember reaction but she does have vertigo and anxiety along with n and v at times. Could be used to treat any of these    Reglan [Metoclopramide] Other (See Comments)    Per patient her Dr. Evelina Bucy her off it    Scopolamine Other (See Comments)    Dizziness, also has vertigo already   Tape Rash    Plastic tape causes rash   Tapentadol Rash   Ultrasound Gel Itching    Patient states the Ultrasound gel caused itching while in skin contact and resolved when wiped away.    Scheduled Meds:  atorvastatin  20 mg Per Tube QPM   chlorhexidine  15 mL Mouth Rinse BID   Chlorhexidine Gluconate Cloth  6 each Topical Q0600   cholecalciferol  1,000 Units Per Tube Daily   feeding supplement (PROSource TF)  45 mL Per Tube BID   fentaNYL  1 patch Transdermal Q72H   free  water  30 mL Per Tube Q4H   heparin  5,000 Units Subcutaneous Q8H   lipase/protease/amylase  36,000 Units Oral TID AC   mouth rinse  15 mL Mouth Rinse q12n4p   midodrine  10 mg Per Tube TID WC   multivitamin  1 tablet Per Tube QHS   nutrition supplement (JUVEN)  1 packet Per Tube BID BM   sevelamer carbonate  800 mg Per Tube TID WC & HS   silver sulfADIAZINE   Topical BID   cyanocobalamin  1,000 mcg Per Tube Daily   Continuous Infusions:  ampicillin-sulbactam (UNASYN) IV     dexmedetomidine (PRECEDEX) IV infusion Stopped (04/30/21 0128)   feeding supplement (OSMOLITE 1.5 CAL) 1,000 mL (04/29/21 1252)   PRN  Meds:.acetaminophen **OR** acetaminophen, albuterol, dextrose, docusate sodium, fentaNYL (SUBLIMAZE) injection, heparin sodium (porcine), polyethylene glycol   Critical care time: 30 minutes     Rosilyn Mings, [place] Pager [phone] (please enter 7 digits) PCCM Consult Pager [phone] (please enter 7 digits)

## 2021-04-30 NOTE — Progress Notes (Signed)
Post HD vitals 

## 2021-04-30 NOTE — Progress Notes (Signed)
Patient still only responsive to pain. No longer hollering out. Precedex stopped at this time. Will continue to monitor.

## 2021-04-30 NOTE — Progress Notes (Signed)
60 y/o F w/ an infected PERM cath, she was extubated several days ago, weaned off precedex, patient is disoriented, not on pressors, not on oxygen. she is DNR/DNI, palliative care team consulted, may consider hospice  Hospitalist service will take over care tomorrow  This is non-billable note

## 2021-04-30 NOTE — Progress Notes (Signed)
Pts son Janeliz Prestwood called to discuss goals of treatment and pts code status.  At this time pts son would like to continue current treatment plan, however he would like to change pts code status to DO NOT RESUSCITATE/DO NOT INTUBATE.  Therefore, pts code status changed to DNR.   Rosilyn Mings, AGNP  Pulmonary/Critical Care Pager [phone] (please enter 7 digits) PCCM Consult Pager [phone] (please enter 7 digits)

## 2021-04-30 NOTE — Progress Notes (Signed)
Date of Admission:  04/25/2021     ID: [name] is a 60 y.o. female  Principal Problem:   Severe sepsis ([place]) Active Problems:   Diabetes mellitus, type II ([place])   Hypoglycemia associated with diabetes ([place])   Generalized weakness   Pressure ulcer, stage 2 ([place])   ESRD (end stage renal disease) ([place])   GERD (gastroesophageal reflux disease)   Acute metabolic encephalopathy   HCAP (healthcare-associated pneumonia)    Subjective: Pt wants to see her nephrologist  Medications:   atorvastatin  20 mg Per Tube QPM   chlorhexidine  15 mL Mouth Rinse BID   Chlorhexidine Gluconate Cloth  6 each Topical Q0600   cholecalciferol  1,000 Units Per Tube Daily   feeding supplement (PROSource TF)  45 mL Per Tube BID   fentaNYL  1 patch Transdermal Q72H   free water  30 mL Per Tube Q4H   heparin  5,000 Units Subcutaneous Q8H   lipase/protease/amylase  36,000 Units Oral TID AC   mouth rinse  15 mL Mouth Rinse q12n4p   midodrine  10 mg Per Tube TID WC   mirtazapine  7.5 mg Per Tube QHS   multivitamin  1 tablet Per Tube QHS   nutrition supplement (JUVEN)  1 packet Per Tube BID BM   QUEtiapine  25 mg Per Tube BID   sevelamer carbonate  800 mg Per Tube TID WC & HS   silver sulfADIAZINE   Topical BID   cyanocobalamin  1,000 mcg Per Tube Daily    Objective: Vital signs in last 24 hours:BP (!) 152/73   Pulse 79   Temp 97.6 F (36.4 C)   Resp 12   Ht 5\' 5"  (1.651 m)   Wt 75.5 kg   SpO2 100%   BMI 27.70 kg/m    PHYSICAL EXAM:  General: Awake alert, does not hear well, but following commands appropriately , pale, chronically ill appearing Head: Normocephalic, without obvious abnormality, atraumatic. Eyes: Conjunctival chemosis,  anicteric sclerae. Pupils are equal ENT Nares normal. No drainage or sinus tenderness. Lips, mucosa, and tongue normal. No Thrush Neck: Supple, symmetrical, no adenopathy, thyroid: non tender no carotid bruit and no JVD. Back: No CVA tenderness. Lungs:  b/l air entry Crepts bases Heart: Tachycardia  Abdomen: Soft, abdominal wall edema Extremities: edema legs Skin: dry Scaly skin Lymph: Cervical, supraclavicular normal. Neurologic: Grossly non-focal  Lab Results CBC Latest Ref Rng & Units 05/01/2021 04/30/2021 04/29/2021  WBC 4.0 - 10.5 K/uL 6.0 7.0 -  Hemoglobin 12.0 - 15.0 g/dL 9.8(L) 9.2(L) 8.6(L)  Hematocrit 36.0 - 46.0 % 30.7(L) 29.2(L) 26.6(L)  Platelets 150 - 400 K/uL 123(L) 107(L) -    CMP Latest Ref Rng & Units 05/01/2021 04/30/2021 04/29/2021  Glucose 70 - 99 mg/dL 193(H) 100(H) 145(H)  BUN 8 - 23 mg/dL 13 10 6(L)  Creatinine 0.44 - 1.00 mg/dL 1.87(H) 1.59(H) 1.10(H)  Sodium 135 - 145 mmol/L 134(L) 133(L) 132(L)  Potassium 3.5 - 5.1 mmol/L 3.6 3.8 3.1(L)  Chloride 98 - 111 mmol/L 97(L) 99 101  CO2 22 - 32 mmol/L 27 26 28   Calcium 8.9 - 10.3 mg/dL 8.3(L) 8.2(L) 8.3(L)  Total Protein 6.5 - 8.1 g/dL - - 6.0(L)  Total Bilirubin 0.3 - 1.2 mg/dL - - 0.6  Alkaline Phos 38 - 126 U/L - - 140(H)  AST 15 - 41 U/L - - 16  ALT 0 - 44 U/L - - 13      Microbiology: 8/4/2 [facility] acinetobacter 04/27/21  BC NG Studies/Results: DG Abd 1 View  Result Date: 04/29/2021 CLINICAL DATA:  Enteric tube placement. EXAM: ABDOMEN - 1 VIEW COMPARISON:  Abdominal x-ray dated April 26, 2021. FINDINGS: Enteric tube in the stomach with tip near the distal body. The bowel gas pattern is normal. Small bilateral pleural effusions. IMPRESSION: 1. Enteric tube in the stomach. Electronically Signed   By: Titus Dubin M.D.   On: 04/29/2021 14:21   CT HEAD WO CONTRAST (5MM)  Result Date: 04/29/2021 CLINICAL DATA:  Delirium.  Sepsis. EXAM: CT HEAD WITHOUT CONTRAST TECHNIQUE: Contiguous axial images were obtained from the base of the skull through the vertex without intravenous contrast. COMPARISON:  CT head without contrast 03/29/2021 FINDINGS: Brain: No acute infarct, hemorrhage, or mass lesion is present. The ventricles are of normal size. No significant extraaxial fluid  collection is present. Vascular: Atherosclerotic calcifications are present in the cavernous internal carotid arteries bilaterally. No hyperdense vessel is present. Skull: Calvarium is intact. No focal lytic or blastic lesions are present. No significant extracranial soft tissue lesion is present. Sinuses/Orbits: Small fluid level is present in the left sphenoid sinus. Asymmetric fluid is present in the left nasal cavity associated with the NG tube. Sinuses are otherwise clear. Fluid is present in the left mastoid air cells. No obstructing nasopharyngeal lesion is present. Bilateral lens replacements are noted. Globes and orbits are otherwise unremarkable. Other: Healed IMPRESSION: 1. Normal CT appearance of the brain. 2. Small fluid level in the left sphenoid sinus. 3. Fluid in the left mastoid air cells and left nasal cavity associated with the NG tube. No obstructing nasopharyngeal lesion is present. Electronically Signed   By: San Morelle M.D.   On: 04/29/2021 15:35   US Abdomen Limited RUQ (LIVER/GB)  Result Date: 04/28/2021 CLINICAL DATA:  Right upper quadrant pain EXAM: ULTRASOUND ABDOMEN LIMITED RIGHT UPPER QUADRANT COMPARISON:  CT 04/25/2021 FINDINGS: Gallbladder: Surgically absent Common bile duct: Diameter: 5.9 mm Liver: Coarse hepatic echotexture with possible contour nodularity. No focal hepatic abnormality. Portal vein is patent on color Doppler imaging with normal direction of blood flow towards the liver. Other: Small amount of perihepatic ascites.  Atrophic right kidney. IMPRESSION: 1. Status post cholecystectomy.  No biliary dilatation 2. Coarse hepatic echotexture with questionable nodularity suggesting underlying hepatic parenchymal disease and potential early cirrhosis changes. Small amount of perihepatic ascites. Electronically Signed   By: Donavan Foil M.D.   On: 04/28/2021 22:36     Assessment/Plan: Acinetobacter bacteremia.  Likely source was a dialysis catheter even though  the tip was negative.  This is a highly susceptible Acinetobacter and hence eravacycline was discontinued and she is currently continuing only on Unasyn 3 g every 8.  We will give her at least 2 weeks.  End date would be 05/11/2021.  Sepsis with hypotension has improved Encephalopathy is improved End-stage renal disease on CRRT  Coronary artery disease /congestive heart failure    Hypoalbuminemia causing anasarca  Anemia of kidney disease  Discussed the management and care team

## 2021-04-30 NOTE — Progress Notes (Signed)
Patient had become slightly more somnolent. Precedex stopped at 0130. On reassessment patient more arousable and asked "if I was praying for her." Precedex remains off. Will continue to monitor.

## 2021-05-01 ENCOUNTER — Ambulatory Visit: Admit: 2021-05-01 | Payer: Medicare Other | Admitting: Vascular Surgery

## 2021-05-01 ENCOUNTER — Inpatient Hospital Stay: Payer: Self-pay

## 2021-05-01 DIAGNOSIS — R7881 Bacteremia: Secondary | ICD-10-CM | POA: Diagnosis not present

## 2021-05-01 LAB — CBC
HCT: 30.7 % — ABNORMAL LOW (ref 36.0–46.0)
Hemoglobin: 9.8 g/dL — ABNORMAL LOW (ref 12.0–15.0)
MCH: 27.5 pg (ref 26.0–34.0)
MCHC: 31.9 g/dL (ref 30.0–36.0)
MCV: 86.2 fL (ref 80.0–100.0)
Platelets: 123 10*3/uL — ABNORMAL LOW (ref 150–400)
RBC: 3.56 MIL/uL — ABNORMAL LOW (ref 3.87–5.11)
RDW: 20.7 % — ABNORMAL HIGH (ref 11.5–15.5)
WBC: 6 10*3/uL (ref 4.0–10.5)
nRBC: 0 % (ref 0.0–0.2)

## 2021-05-01 LAB — RENAL FUNCTION PANEL
Albumin: 2.6 g/dL — ABNORMAL LOW (ref 3.5–5.0)
Anion gap: 10 (ref 5–15)
BUN: 13 mg/dL (ref 8–23)
CO2: 27 mmol/L (ref 22–32)
Calcium: 8.3 mg/dL — ABNORMAL LOW (ref 8.9–10.3)
Chloride: 97 mmol/L — ABNORMAL LOW (ref 98–111)
Creatinine, Ser: 1.87 mg/dL — ABNORMAL HIGH (ref 0.44–1.00)
GFR, Estimated: 30 mL/min — ABNORMAL LOW (ref >60)
Glucose, Bld: 193 mg/dL — ABNORMAL HIGH (ref 70–99)
Phosphorus: 1.8 mg/dL — ABNORMAL LOW (ref 2.5–4.6)
Potassium: 3.6 mmol/L (ref 3.5–5.1)
Sodium: 134 mmol/L — ABNORMAL LOW (ref 135–145)

## 2021-05-01 LAB — GLUCOSE, CAPILLARY
Glucose-Capillary: 172 mg/dL — ABNORMAL HIGH (ref 70–99)
Glucose-Capillary: 238 mg/dL — ABNORMAL HIGH (ref 70–99)
Glucose-Capillary: 274 mg/dL — ABNORMAL HIGH (ref 70–99)
Glucose-Capillary: 180 mg/dL — ABNORMAL HIGH (ref 70–99)
Glucose-Capillary: 199 mg/dL — ABNORMAL HIGH (ref 70–99)

## 2021-05-01 LAB — MAGNESIUM: Magnesium: 2.3 mg/dL (ref 1.7–2.4)

## 2021-05-01 LAB — CATH TIP CULTURE: Culture: NO GROWTH

## 2021-05-01 SURGERY — INSERTION OF ARTERIOVENOUS (AV) GORE-TEX GRAFT ARM
Anesthesia: General | Laterality: Right

## 2021-05-01 MED ORDER — HEPARIN SODIUM (PORCINE) 1000 UNIT/ML IJ SOLN
2000.0000 [IU] | Freq: Once | INTRAMUSCULAR | Status: AC
  Administered 2021-05-01: 2000 [IU] via INTRAVENOUS

## 2021-05-01 MED ORDER — POLYVINYL ALCOHOL 1.4 % OP SOLN
1.0000 [drp] | OPHTHALMIC | Status: DC | PRN
  Administered 2021-05-01 – 2021-05-09 (×4): 1 [drp] via OPHTHALMIC
  Filled 2021-05-01: qty 15

## 2021-05-01 MED ORDER — SILVER SULFADIAZINE 1 % EX CREA
TOPICAL_CREAM | Freq: Two times a day (BID) | CUTANEOUS | Status: DC
  Administered 2021-05-05: 1 via TOPICAL
  Filled 2021-05-01 (×2): qty 85

## 2021-05-01 NOTE — Progress Notes (Signed)
PROGRESS NOTE    [name]  BHA:[id number] DOB: [birth year] DOA: 04/25/2021 PCP: Denton Lank, MD   Chief complaint.  Altered mental status. Brief Narrative:  [name] is a 60 y.o. female with medical history significant for end-stage renal disease on hemodialysis on Monday, Wednesday, Friday schedule, diabetes mellitus complicated by peripheral polyneuropathy and gastroparesis,, hyperlipidemia, anemia of chronic kidney disease with baseline hemoglobin 8-10, hypertension, who is admitted to [facility] on 04/25/2021 with severe sepsis after presenting from home to [facility] ED complaining of fever.  Patient was diagnosed with septic shock, blood culture positive for Acinetobacter calcoaceticus.  This appears to be secondary to dialysis catheter infection.  Patient also developed acute hypoxemic respiratory failure required mechanical ventilation. Permacath was removed on 8/06.  Patient treated with Unasyn. Transfer out of ICU on 8/9.  Assessment & Plan:   Principal Problem:   Severe sepsis ([place]) Active Problems:   Diabetes mellitus, type II ([place])   Hypoglycemia associated with diabetes ([place])   Generalized weakness   Pressure ulcer, stage 2 (HCC)   ESRD (end stage renal disease) (HCC)   GERD (gastroesophageal reflux disease)   Acute metabolic encephalopathy   HCAP (healthcare-associated pneumonia)  #1.  Septic shock with Acinetobacter septicemia secondary to dialysis catheter infection. Permacath infection. Appreciate ID consult, will continue Unasyn for now. Patient condition had improved.  2.  Acute hypoxemic respiratory failure secondary to septic shock. Condition improved. Patient had some confusion yesterday, currently receiving tube feeding via NG tube.  Obtain speech therapy evaluation.  #3.  End-stage renal disease. Hypophosphatemia. Hyponatremia. Followed by nephrology.  4.  Anemia of chronic disease Thrombocytopenia Continue to  follow.      DVT prophylaxis: Heparin Code Status: DNR Family Communication:  Disposition Plan:    Status is: Inpatient  Remains inpatient appropriate because:IV treatments appropriate due to intensity of illness or inability to take PO and Inpatient level of care appropriate due to severity of illness  Dispo: The patient is from: Home              Anticipated d/c is to: Home              Patient currently is not medically stable to d/c.   Difficult to place patient No        I/O last 3 completed shifts: In: 1081.8 [I.V.:26.8; NG/GT:1055] Out: 2000 [Other:2000] No intake/output data recorded.     Consultants:  Nephrology  Procedures: HD  Antimicrobials: Unasyn  Subjective: Patient has some confusion, she is tolerating tube feeding. Denies any short of breath or cough. No abdominal pain or nausea vomiting  No fever or chills. No chest pain or palpitation  Objective: Vitals:   05/01/21 1230 05/01/21 1245 05/01/21 1300 05/01/21 1315  BP: (!) 148/57 (!) 135/99 (!) 154/57 (!) 145/83  Pulse: 81 81    Resp: 16 19 15 18   Temp:      TempSrc:      SpO2: 100% 100% 100% 100%  Weight:      Height:        Intake/Output Summary (Last 24 hours) at 05/01/2021 1330 Last data filed at 05/01/2021 0600 Gross per 24 hour  Intake 660 ml  Output --  Net 660 ml   Filed Weights   04/29/21 0454 04/30/21 0500 05/01/21 0500  Weight: 83.1 kg 80.6 kg 75.5 kg    Examination:  General exam: Appears calm and comfortable  Respiratory system: Clear to auscultation. Respiratory effort normal. Cardiovascular system:  S1 & S2 heard, RRR. No JVD, murmurs, rubs, gallops or clicks. No pedal edema. Gastrointestinal system: Abdomen is nondistended, soft and nontender. No organomegaly or masses felt. Normal bowel sounds heard. Central nervous system: Alert and orientedx 2.  No focal neurological deficits. Extremities: Symmetric 5 x 5 power. Skin: No rashes, lesions or  ulcers Psychiatry: Mood & affect appropriate.     Data Reviewed: I have personally reviewed following labs and imaging studies  CBC: Recent Labs  Lab 04/25/21 1651 04/26/21 0415 04/27/21 0417 04/28/21 0447 04/28/21 1718 04/29/21 0411 04/29/21 1318 04/30/21 0512 05/01/21 0433  WBC 5.7 4.1 11.3* 10.2  --  6.2  --  7.0 6.0  NEUTROABS 4.5 3.4  --   --   --   --   --   --   --   HGB 9.1* 8.1* 7.7* 7.0* 7.5* 7.1* 8.6* 9.2* 9.8*  HCT 28.8* 25.5* 24.1* 21.4* 23.7* 21.9* 26.6* 29.2* 30.7*  MCV 89.7 91.1 88.3 87.3  --  90.1  --  88.5 86.2  PLT 245 188 149* 140*  --  115*  --  107* 716*   Basic Metabolic Panel: Recent Labs  Lab 04/26/21 0415 04/26/21 1932 04/27/21 0417 04/28/21 0447 04/28/21 1718 04/29/21 0411 04/30/21 0512 05/01/21 0433  NA 131*  --    < > 133* 133* 132* 133* 134*  K 5.0  --    < > 2.8* 3.1* 3.1* 3.8 3.6  CL 100  --    < > 101 101 101 99 97*  CO2 24  --    < > 27 27 28 26 27   GLUCOSE 103*  --    < > 114* 155* 145* 100* 193*  BUN 18  --    < > 6* 6* 6* 10 13  CREATININE 4.04*  --    < > 1.27* 1.15* 1.10* 1.59* 1.87*  CALCIUM 7.0*  --    < > 8.5* 8.8* 8.3* 8.2* 8.3*  MG 1.6* 1.6*  --  1.7  --  1.9  --  2.3  PHOS 3.9  --    < > 1.5* 2.1* 2.0* 2.2* 1.8*   < > = values in this interval not displayed.   GFR: Estimated Creatinine Clearance: 32.1 mL/min (A) (by C-G formula based on SCr of 1.87 mg/dL (H)). Liver Function Tests: Recent Labs  Lab 04/25/21 1651 04/26/21 0415 04/27/21 0417 04/28/21 0447 04/28/21 1718 04/29/21 0411 04/30/21 0512 05/01/21 0433  AST 23 23  --   --   --  16  --   --   ALT 14 11  --   --   --  13  --   --   ALKPHOS 137* 100  --   --   --  140*  --   --   BILITOT 0.5 0.8  --   --   --  0.6  --   --   PROT 7.6 5.5*  --   --   --  6.0*  --   --   ALBUMIN 2.3* 1.6*   < > 2.3* 2.9* 2.9*  2.6* 2.4* 2.6*   < > = values in this interval not displayed.   Recent Labs  Lab 04/25/21 1651  LIPASE 20   Recent Labs  Lab  04/26/21 0415  AMMONIA 55*   Coagulation Profile: Recent Labs  Lab 04/25/21 1651  INR 1.2   Cardiac Enzymes: No results for input(s): CKTOTAL, CKMB, CKMBINDEX, TROPONINI in the last 168  hours. BNP (last 3 results) No results for input(s): PROBNP in the last 8760 hours. HbA1C: No results for input(s): HGBA1C in the last 72 hours. CBG: Recent Labs  Lab 04/30/21 1916 04/30/21 2315 05/01/21 0407 05/01/21 0728 05/01/21 1057  GLUCAP 126* 142* 172* 238* 274*   Lipid Profile: No results for input(s): CHOL, HDL, LDLCALC, TRIG, CHOLHDL, LDLDIRECT in the last 72 hours. Thyroid Function Tests: No results for input(s): TSH, T4TOTAL, FREET4, T3FREE, THYROIDAB in the last 72 hours. Anemia Panel: No results for input(s): VITAMINB12, FOLATE, FERRITIN, TIBC, IRON, RETICCTPCT in the last 72 hours. Sepsis Labs: Recent Labs  Lab 04/25/21 1651 04/25/21 1838 04/26/21 0415 04/27/21 0417  PROCALCITON  --  0.81 11.70 13.35  LATICACIDVEN 1.4 1.1  --   --     Recent Results (from the past 240 hour(s))  Culture, blood (Routine x 2)     Status: Abnormal   Collection Time: 04/25/21  4:51 PM   Specimen: BLOOD  Result Value Ref Range Status   Specimen Description   Final    BLOOD LW Performed at [facility], [address] [place], [place] [address]    Special Requests   Final    BOTTLES DRAWN AEROBIC AND ANAEROBIC Blood Culture adequate volume Performed at [facility], [address] [place], [place] [address]    Culture  Setup Time   Final    GRAM VARIABLE COCCOBACILLI AEROBIC BOTTLE ONLY Organism ID to follow CRITICAL RESULT CALLED TO, READ BACK BY AND VERIFIED WITH: [facility] AT 1014 04/26/21.PMF Performed at [facility], [address] [place], [place] [address]    Culture ACINETOBACTER CALCOACETICUS/BAUMANNII COMPLEX (A)  Final   Report Status 04/28/2021 FINAL  Final   Organism ID, Bacteria ACINETOBACTER CALCOACETICUS/BAUMANNII COMPLEX   Final      Susceptibility   Acinetobacter calcoaceticus/baumannii complex - MIC*    CEFTAZIDIME 16 INTERMEDIATE Intermediate     CIPROFLOXACIN <=0.25 SENSITIVE Sensitive     GENTAMICIN <=1 SENSITIVE Sensitive     IMIPENEM <=0.25 SENSITIVE Sensitive     PIP/TAZO 32 INTERMEDIATE Intermediate     TRIMETH/SULFA <=20 SENSITIVE Sensitive     AMPICILLIN/SULBACTAM <=2 SENSITIVE Sensitive     * ACINETOBACTER CALCOACETICUS/BAUMANNII COMPLEX  Blood Culture ID Panel (Reflexed)     Status: Abnormal   Collection Time: 04/25/21  4:51 PM  Result Value Ref Range Status   Enterococcus faecalis NOT DETECTED NOT DETECTED Final   Enterococcus Faecium NOT DETECTED NOT DETECTED Final   Listeria monocytogenes NOT DETECTED NOT DETECTED Final   Staphylococcus species NOT DETECTED NOT DETECTED Final   Staphylococcus aureus (BCID) NOT DETECTED NOT DETECTED Final   Staphylococcus epidermidis NOT DETECTED NOT DETECTED Final   Staphylococcus lugdunensis NOT DETECTED NOT DETECTED Final   Streptococcus species NOT DETECTED NOT DETECTED Final   Streptococcus agalactiae NOT DETECTED NOT DETECTED Final   Streptococcus pneumoniae NOT DETECTED NOT DETECTED Final   Streptococcus pyogenes NOT DETECTED NOT DETECTED Final   A.calcoaceticus-baumannii DETECTED (A) NOT DETECTED Final    Comment: CRITICAL RESULT CALLED TO, READ BACK BY AND VERIFIED WITH: CHARLES SHANLEVER AT 1014 04/26/21.PMF    Bacteroides fragilis NOT DETECTED NOT DETECTED Final   Enterobacterales NOT DETECTED NOT DETECTED Final   Enterobacter cloacae complex NOT DETECTED NOT DETECTED Final   Escherichia coli NOT DETECTED NOT DETECTED Final   Klebsiella aerogenes NOT DETECTED NOT DETECTED Final   Klebsiella oxytoca NOT DETECTED NOT DETECTED Final   Klebsiella pneumoniae NOT DETECTED NOT DETECTED  Final   Proteus species NOT DETECTED NOT DETECTED Final   Salmonella species NOT DETECTED NOT DETECTED Final   Serratia marcescens NOT DETECTED NOT DETECTED Final    Haemophilus influenzae NOT DETECTED NOT DETECTED Final   Neisseria meningitidis NOT DETECTED NOT DETECTED Final   Pseudomonas aeruginosa NOT DETECTED NOT DETECTED Final   Stenotrophomonas maltophilia NOT DETECTED NOT DETECTED Final   Candida albicans NOT DETECTED NOT DETECTED Final   Candida auris NOT DETECTED NOT DETECTED Final   Candida glabrata NOT DETECTED NOT DETECTED Final   Candida krusei NOT DETECTED NOT DETECTED Final   Candida parapsilosis NOT DETECTED NOT DETECTED Final   Candida tropicalis NOT DETECTED NOT DETECTED Final   Cryptococcus neoformans/gattii NOT DETECTED NOT DETECTED Final   CTX-M ESBL NOT DETECTED NOT DETECTED Final   Carbapenem resistance IMP NOT DETECTED NOT DETECTED Final   Carbapenem resistance KPC NOT DETECTED NOT DETECTED Final   Carbapenem resistance NDM NOT DETECTED NOT DETECTED Final   Carbapenem resistance VIM NOT DETECTED NOT DETECTED Final    Comment: Performed at [facility], [address] [place], [place] [address]  Culture, blood (Routine x 2)     Status: Abnormal   Collection Time: 04/25/21  6:38 PM   Specimen: BLOOD  Result Value Ref Range Status   Specimen Description   Final    BLOOD BLOOD LEFT WRIST Performed at [facility], [address] [place], [place] [address]    Special Requests   Final    BOTTLES DRAWN AEROBIC AND ANAEROBIC Blood Culture adequate volume Performed at [facility], [address] [place], [place] [address]    Culture  Setup Time   Final    GRAM VARIABLE COCCOBACILLI AEROBIC BOTTLE ONLY CRITICAL VALUE NOTED.  VALUE IS CONSISTENT WITH PREVIOUSLY REPORTED AND CALLED VALUE. Performed at [facility], [address] [place], [place] [address]    Culture (A)  Final    ACINETOBACTER CALCOACETICUS/BAUMANNII COMPLEX SUSCEPTIBILITIES PERFORMED ON PREVIOUS CULTURE WITHIN THE LAST 5 DAYS. Performed at [facility], [place] [address] [place], [place] [address]    Report  Status 04/28/2021 FINAL  Final  Resp Panel by RT-PCR (Flu A&B, Covid) Nasopharyngeal Swab     Status: None   Collection Time: 04/25/21  6:54 PM   Specimen: Nasopharyngeal Swab; Nasopharyngeal(NP) swabs in vial transport medium  Result Value Ref Range Status   SARS Coronavirus 2 by RT PCR NEGATIVE NEGATIVE Final    Comment: (NOTE) SARS-CoV-2 target nucleic acids are NOT DETECTED.  The SARS-CoV-2 RNA is generally detectable in upper respiratory specimens during the acute phase of infection. The lowest concentration of SARS-CoV-2 viral copies this assay can detect is 138 copies/mL. A negative result does not preclude SARS-Cov-2 infection and should not be used as the sole basis for treatment or other patient management decisions. A negative result may occur with  improper specimen collection/handling, submission of specimen other than nasopharyngeal swab, presence of viral mutation(s) within the areas targeted by this assay, and inadequate number of viral copies(<138 copies/mL). A negative result must be combined with clinical observations, patient history, and epidemiological information. The expected result is Negative.  Fact Sheet for Patients:  EntrepreneurPulse.com.au  Fact Sheet for Healthcare Providers:  IncredibleEmployment.be  This test is no t yet approved or cleared by the [place] FDA and  has been authorized for detection and/or diagnosis of SARS-CoV-2 by FDA under an Emergency Use Authorization (EUA). This EUA will remain  in effect (  meaning this test can be used) for the duration of the COVID-19 declaration under Section 564(b)(1) of the Act, 21 U.S.C.section 360bbb-3(b)(1), unless the authorization is terminated  or revoked sooner.       Influenza A by PCR NEGATIVE NEGATIVE Final   Influenza B by PCR NEGATIVE NEGATIVE Final    Comment: (NOTE) The Xpert Xpress SARS-CoV-2/FLU/RSV plus assay is intended as an aid in the  diagnosis of influenza from Nasopharyngeal swab specimens and should not be used as a sole basis for treatment. Nasal washings and aspirates are unacceptable for Xpert Xpress SARS-CoV-2/FLU/RSV testing.  Fact Sheet for Patients: EntrepreneurPulse.com.au  Fact Sheet for Healthcare Providers: IncredibleEmployment.be  This test is not yet approved or cleared by the [place] FDA and has been authorized for detection and/or diagnosis of SARS-CoV-2 by FDA under an Emergency Use Authorization (EUA). This EUA will remain in effect (meaning this test can be used) for the duration of the COVID-19 declaration under Section 564(b)(1) of the Act, 21 U.S.C. section 360bbb-3(b)(1), unless the authorization is terminated or revoked.  Performed at [facility], [address] [place], [place] [address]   MRSA Next Gen by PCR, Nasal     Status: None   Collection Time: 04/26/21  6:35 PM   Specimen: Nasal Mucosa; Nasal Swab  Result Value Ref Range Status   MRSA by PCR Next Gen NOT DETECTED NOT DETECTED Final    Comment: (NOTE) The GeneXpert MRSA Assay (FDA approved for NASAL specimens only), is one component of a comprehensive MRSA colonization surveillance program. It is not intended to diagnose MRSA infection nor to guide or monitor treatment for MRSA infections. Test performance is not FDA approved in patients less than 13 years old. Performed at [facility], [address] [place], [place] [address]   Cath Tip Culture     Status: None   Collection Time: 04/27/21 11:59 AM   Specimen: Catheter Tip; Other  Result Value Ref Range Status   Specimen Description   Final    CATH TIP Performed at [facility], [address] [place], [place] [address]    Special Requests   Final    NONE Performed at [facility], [address], [place], [place] [address]    Culture   Final    NO GROWTH Performed at [facility], [place] [address] [place], [place] [address]    Report Status 05/01/2021 FINAL  Final  CULTURE, BLOOD (ROUTINE X 2) w Reflex to ID Panel     Status: None (Preliminary result)   Collection Time: 04/28/21 11:34 AM   Specimen: BLOOD  Result Value Ref Range Status   Specimen Description BLOOD RIGHT AC  Final   Special Requests   Final    BOTTLES DRAWN AEROBIC ONLY Blood Culture results may not be optimal due to an inadequate volume of blood received in culture bottles   Culture   Final    NO GROWTH 3 DAYS Performed at [facility], [address], [place],  [address]    Report Status PENDING  Incomplete  CULTURE, BLOOD (ROUTINE X 2) w Reflex to ID Panel     Status: None (Preliminary result)   Collection Time: 04/28/21  5:27 PM   Specimen: BLOOD  Result Value Ref Range Status   Specimen Description BLOOD LT FINGER IN HAND  Final   Special Requests BOTTLES DRAWN AEROBIC ONLY  BCAV  Final   Culture   Final  NO GROWTH 3 DAYS Performed at [facility], [address] [place], [place] [address]    Report Status PENDING  Incomplete         Radiology Studies: CT HEAD WO CONTRAST (5MM)  Result Date: 04/29/2021 CLINICAL DATA:  Delirium.  Sepsis. EXAM: CT HEAD WITHOUT CONTRAST TECHNIQUE: Contiguous axial images were obtained from the base of the skull through the vertex without intravenous contrast. COMPARISON:  CT head without contrast 03/29/2021 FINDINGS: Brain: No acute infarct, hemorrhage, or mass lesion is present. The ventricles are of normal size. No significant extraaxial fluid collection is present. Vascular: Atherosclerotic calcifications are present in the cavernous internal carotid arteries bilaterally. No hyperdense vessel is present. Skull: Calvarium is intact. No focal lytic or blastic lesions are present. No significant extracranial soft tissue lesion is present. Sinuses/Orbits: Small fluid level is present in the left sphenoid sinus.  Asymmetric fluid is present in the left nasal cavity associated with the NG tube. Sinuses are otherwise clear. Fluid is present in the left mastoid air cells. No obstructing nasopharyngeal lesion is present. Bilateral lens replacements are noted. Globes and orbits are otherwise unremarkable. Other: Healed IMPRESSION: 1. Normal CT appearance of the brain. 2. Small fluid level in the left sphenoid sinus. 3. Fluid in the left mastoid air cells and left nasal cavity associated with the NG tube. No obstructing nasopharyngeal lesion is present. Electronically Signed   By: San Morelle M.D.   On: 04/29/2021 15:35        Scheduled Meds:  atorvastatin  20 mg Per Tube QPM   chlorhexidine  15 mL Mouth Rinse BID   Chlorhexidine Gluconate Cloth  6 each Topical Q0600   cholecalciferol  1,000 Units Per Tube Daily   feeding supplement (PROSource TF)  45 mL Per Tube BID   fentaNYL  1 patch Transdermal Q72H   free water  30 mL Per Tube Q4H   heparin  5,000 Units Subcutaneous Q8H   lipase/protease/amylase  36,000 Units Oral TID AC   mouth rinse  15 mL Mouth Rinse q12n4p   midodrine  10 mg Per Tube TID WC   mirtazapine  7.5 mg Per Tube QHS   multivitamin  1 tablet Per Tube QHS   nutrition supplement (JUVEN)  1 packet Per Tube BID BM   QUEtiapine  25 mg Per Tube BID   sevelamer carbonate  800 mg Per Tube TID WC & HS   silver sulfADIAZINE   Topical BID   cyanocobalamin  1,000 mcg Per Tube Daily   Continuous Infusions:  ampicillin-sulbactam (UNASYN) IV 3 g (05/01/21 0930)   dexmedetomidine (PRECEDEX) IV infusion Stopped (04/30/21 0128)   feeding supplement (OSMOLITE 1.5 CAL) 1,000 mL (05/01/21 0409)     LOS: 6 days    Time spent: 32 minutes    Sharen Hones, MD [facility]   To contact the attending provider between 7A-7P or the covering provider during after hours 7P-7A, please log into the web site [email or web address] and access using universal [place] password for that web site. If  you do not have the password, please call the hospital operator.  05/01/2021, 1:30 PM

## 2021-05-01 NOTE — Progress Notes (Addendum)
Daily Progress Note   Patient Name: [name]       Date: 05/01/2021 DOB: 1961/06/29  Age: 60 y.o. MRN#: [id number] Attending Physician: Sharen Hones, MD Primary Care Physician: Denton Lank, MD Admit Date: 04/25/2021  Reason for Consultation/Follow-up: Establishing goals of care  Subjective: Met with patient, son, and daughter. Patient appears oriented but very hard of hearing.   We discussed her diagnoses, prognosis, GOC, EOL wishes disposition and options.  A detailed discussion was had today regarding advanced directives.  Concepts specific to code status, artifical feeding and hydration, IV antibiotics and rehospitalization were discussed.  The difference between an aggressive medical intervention path and a comfort care path was discussed.  Values and goals of care important to patient and family were attempted to be elicited.  Discussed limitations of medical interventions to prolong quality of life in some situations and discussed the concept of human mortality.  She discusses her pain and suffering,and just being tired. She discussed all the family members who have already gone to heaven, and voices she looks forward to seeing them. She discusses importance of QOL. Son and daughter tell her he is okay if she wants to stop dialysis. She states "I am so glad to hear that".   We discussed options on care moving forward. She confirms DNR/DNI. She states she wants to try if she can to make it to 54 years old as her sisters lived to be 69, and it is only a few weeks away; she states after turning 35, she would then like to discuss changing to comfort care.       Length of Stay: 6  Current Medications: Scheduled Meds:  . atorvastatin  20 mg Per Tube QPM  . chlorhexidine  15 mL Mouth Rinse  BID  . Chlorhexidine Gluconate Cloth  6 each Topical Q0600  . cholecalciferol  1,000 Units Per Tube Daily  . feeding supplement (PROSource TF)  45 mL Per Tube BID  . fentaNYL  1 patch Transdermal Q72H  . free water  30 mL Per Tube Q4H  . heparin  5,000 Units Subcutaneous Q8H  . lipase/protease/amylase  36,000 Units Oral TID AC  . mouth rinse  15 mL Mouth Rinse q12n4p  . midodrine  10 mg Per Tube TID WC  . mirtazapine  7.5 mg Per  Tube QHS  . multivitamin  1 tablet Per Tube QHS  . nutrition supplement (JUVEN)  1 packet Per Tube BID BM  . QUEtiapine  25 mg Per Tube BID  . sevelamer carbonate  800 mg Per Tube TID WC & HS  . silver sulfADIAZINE   Topical BID  . cyanocobalamin  1,000 mcg Per Tube Daily    Continuous Infusions: . ampicillin-sulbactam (UNASYN) IV 3 g (05/01/21 0930)  . dexmedetomidine (PRECEDEX) IV infusion Stopped (04/30/21 0128)  . feeding supplement (OSMOLITE 1.5 CAL) 1,000 mL (05/01/21 0409)    PRN Meds: acetaminophen **OR** acetaminophen, albuterol, dextrose, docusate sodium, fentaNYL (SUBLIMAZE) injection, heparin sodium (porcine), polyethylene glycol  Physical Exam Pulmonary:     Comments: Audible wheezing- received neb treatment. Neurological:     Mental Status: She is alert.            Vital Signs: BP (!) 152/73   Pulse 79   Temp 97.6 F (36.4 C)   Resp 12   Ht 5' 5"  (1.651 m)   Wt 75.5 kg   SpO2 100%   BMI 27.70 kg/m  SpO2: SpO2: 100 % O2 Device: O2 Device: Nasal Cannula O2 Flow Rate: O2 Flow Rate (L/min): 2 L/min  Intake/output summary:  Intake/Output Summary (Last 24 hours) at 05/01/2021 1126 Last data filed at 05/01/2021 0600 Gross per 24 hour  Intake 660 ml  Output 2000 ml  Net -1340 ml   LBM: Last BM Date: 05/01/21 Baseline Weight: Weight: 52 kg Most recent weight: Weight: 75.5 kg     Patient Active Problem List   Diagnosis Date Noted  . Severe sepsis ([place]) 04/25/2021  . HCAP (healthcare-associated pneumonia) 04/25/2021  .  Acute metabolic encephalopathy 62/69/4854  . UTI (urinary tract infection) 03/29/2021  . Chronic respiratory failure with hypoxia ([place]) 12/18/2020  . Superior vena caval syndrome 12/14/2020  . Chronic diastolic CHF (congestive heart failure) ([place]) 12/04/2020  . Bradycardia 09/17/2020  . Advanced care planning/counseling discussion   . Discitis of thoracic region   . Fluid overload 08/10/2020  . HLD (hyperlipidemia) 08/10/2020  . Chest pain 08/10/2020  . CAD (coronary artery disease) 08/10/2020  . GERD (gastroesophageal reflux disease) 08/10/2020  . Obesity (BMI 30-39.9) 06/06/2020  . Anemia in ESRD (end-stage renal disease) ([place]) 05/31/2020  . Type 1 diabetes mellitus ([place]) 05/31/2020  . End-stage renal disease ([place]) 05/31/2020  . Hypertensive disorder 05/31/2020  . Arm pain 05/21/2020  . Cellulitis of right leg 05/02/2020  . ESRD (end stage renal disease) ([place]) 04/13/2020  . SOBOE (shortness of breath on exertion) 02/08/2020  . Hematoma of arm, left, subsequent encounter 11/24/2019  . Respiratory failure ([place])   . Shortness of breath   . Complication of arteriovenous dialysis fistula 11/13/2019  . PVD (peripheral vascular disease) ([place]) 11/13/2019  . Severe anemia 11/13/2019  . Pressure ulcer, stage 2 ([place]) 11/13/2019  . ESRD on hemodialysis ([place])   . Hemorrhagic shock ([place]) 11/11/2019  . Leg pain 10/02/2019  . CAP (community acquired pneumonia) 10/01/2019  . Atherosclerosis of native arteries of extremity with intermittent claudication ([place]) 05/01/2019  . Right-sided headache   . COPD (chronic obstructive pulmonary disease) ([place]) 10/06/2018  . Syncope 10/04/2018  . Symptomatic anemia 06/18/2018  . Gastroparesis due to DM ([place]) 01/18/2018  . Complication of vascular access for dialysis 12/04/2017  . Osteomyelitis ([place]) 09/30/2017  . Carotid stenosis 06/18/2017  . Bilateral carotid artery stenosis 06/16/2017  . Benign essential HTN 05/19/2017  . CKD (chronic kidney  disease) stage  5, GFR less than 15 ml/min ([facility]) 05/19/2017  . Hyperlipidemia, mixed 05/19/2017  . Shortness of breath 05/04/2017  . Rash, skin 12/16/2016  . Cellulitis of lower extremity 07/29/2016  . Chronic venous insufficiency 07/29/2016  . Lymphedema 07/29/2016  . TIA (transient ischemic attack) 04/21/2016  . Altered mental status 04/08/2016  . Hyperammonemia ([place]) 04/08/2016  . Elevated troponin 04/08/2016  . Depression 04/08/2016  . Depression, major, recurrent, severe with psychosis ([place]) 04/08/2016  . Blood in stool   . Intractable cyclical vomiting with nausea   . Reflux esophagitis   . Gastritis   . Generalized abdominal pain   . Uncontrollable vomiting   . Major depressive disorder, recurrent episode, moderate ([place]) 03/15/2016  . Adjustment disorder with mixed anxiety and depressed mood 03/15/2016  . Malnutrition of moderate degree 12/01/2015  . Renal mass   . Dyspnea   . Acute renal failure ([place])   . Respiratory failure ([place])   . High temperature 11/14/2015  . Encounter for central line placement   . Encounter for orogastric (OG) tube placement   . Nausea 11/12/2015  . Hyperkalemia 10/03/2015  . Diarrhea, unspecified 07/22/2015  . Pneumonia 05/21/2015  . Hypoglycemia 04/24/2015  . Unresponsiveness 04/24/2015  . Sinus bradycardia 04/24/2015  . Hypothermia 04/24/2015  . Acute respiratory failure ([place]) 04/24/2015  . Acute on chronic diastolic CHF (congestive heart failure) ([place]) 04/05/2015  . Diabetic gastroparesis ([place]) 04/05/2015  . Hypokalemia 04/05/2015  . Generalized weakness 04/05/2015  . Acute pulmonary edema ([place]) 04/03/2015  . Nausea and vomiting 04/03/2015  . Hypoglycemia associated with diabetes ([place]) 04/03/2015  . Anemia of chronic disease 04/03/2015  . Secondary hyperparathyroidism ([place]) 04/03/2015  . Pressure ulcer 04/02/2015  . Acute respiratory failure with hypoxia ([place]) 04/01/2015  . Adjustment disorder with anxiety 03/14/2015  . Somatic  symptom disorder, mild 03/08/2015  . Coronary artery disease involving native coronary artery of native heart without angina pectoris   . Nausea & vomiting 03/06/2015  . Abdominal pain 03/06/2015  . Diabetes mellitus, type II ([place]) 03/06/2015  . HTN (hypertension) 03/06/2015  . Gastroparesis 02/24/2015  . Pleural effusion 02/19/2015  . Bacteremia due to Enterococcus 02/19/2015  . End-stage renal disease on hemodialysis ([place]) 02/19/2015  . Diarrhea 08/01/2014    Palliative Care Assessment & Plan    Recommendations/Plan: DNR/DNI. Treat the treatable. D/C with palliative. Patient may decide for hospice care after her 60nd birthday, in a few weeks.     Code Status:    Code Status Orders  (From admission, onward)           Start     Ordered   04/30/21 1445  Do not attempt resuscitation (DNR)  Continuous       Question Answer Comment  In the event of cardiac or respiratory ARREST Do not call a "code blue"   In the event of cardiac or respiratory ARREST Do not perform Intubation, CPR, defibrillation or ACLS   In the event of cardiac or respiratory ARREST Use medication by any route, position, wound care, and other measures to relive pain and suffering. May use oxygen, suction and manual treatment of airway obstruction as needed for comfort.      04/30/21 1444           Code Status History     Date Active Date Inactive Code Status Order ID Comments User Context   04/26/2021 1432 04/30/2021 1444 Full Code [id number]  Ottie Glazier, MD ED   04/25/2021 2123 04/26/2021 1432 Full Code  [id number]  Rhetta Mura, DO ED   03/29/2021 0244 04/02/2021 2300 Full Code [id number]  Athena Masse, MD ED   12/04/2020 2042 12/17/2020 2147 DNR [id number]  Ileene Musa T, DO ED   12/04/2020 1939 12/04/2020 2042 Full Code [id number]  Orene Desanctis, DO ED   09/17/2020 1431 09/21/2020 1925 Full Code [id number]  Tyna Jaksch, MD ED   08/10/2020 1323 08/17/2020 2223 Full Code [id number]  Ivor Costa, MD ED    06/02/2020 2251 06/25/2020 2316 Full Code [id number]  Elwyn Reach, MD ED   05/22/2020 1201 05/23/2020 2050 Full Code [id number]  Vashti Hey, MD ED   05/02/2020 2057 05/06/2020 1929 Full Code [id number]  Mansy, Arvella Merles, MD ED   04/13/2020 1458 04/14/2020 2239 Full Code [id number]  Judithann Sheen, Janalyn Harder, PA-C Inpatient   11/24/2019 2038 11/30/2019 1824 Full Code [id number]  [place], Warnell Bureau, MD Inpatient   11/11/2019 0715 11/19/2019 0006 Full Code [id number]  Awilda Bill, NP ED   09/30/2019 2126 10/01/2019 2257 Full Code [id number]  Nolberto Hanlon, MD ED   10/04/2018 1847 10/12/2018 0027 Full Code [id number]  Dustin Flock, MD Inpatient   06/18/2018 0449 06/20/2018 1642 Full Code [id number]  Arta Silence, MD ED   02/12/2018 1312 02/16/2018 0050 Full Code [id number]  Epifanio Lesches, MD ED   01/17/2018 1848 01/21/2018 1341 Full Code [id number]  Vaughan Basta, MD Inpatient   09/30/2017 1804 09/27/2017 2238 Full Code [id number]  Vaughan Basta, MD Inpatient   09/03/2017 2243 09/05/2017 1901 Full Code [id number]  Lance Coon, MD Inpatient   05/04/2017 1833 05/05/2017 2330 Full Code [id number]  Henreitta Leber, MD Inpatient   03/23/2017 1551 03/25/2017 1739 Full Code [id number]  Gladstone Lighter, MD Inpatient   04/21/2016 1559 04/21/2016 1717 Full Code [id number]  Nicholes Mango, MD Inpatient   04/08/2016 1415 04/11/2016 1947 Full Code [id number]  Theodoro Grist, MD Inpatient   03/15/2016 1322 03/20/2016 0019 Full Code [id number]  Bettey Costa, MD Inpatient   02/16/2016 1409 02/19/2016 2238 Full Code [id number]  Bettey Costa, MD ED   12/17/2015 1139 12/21/2015 2209 Full Code [id number]  Dustin Flock, MD ED   11/12/2015 1602 12/01/2015 1943 Full Code [id number]  Bettey Costa, MD Inpatient   10/07/2015 0937 10/12/2015 1959 Full Code [id number]  Fritzi Mandes, MD ED   10/03/2015 0355 10/05/2015 2230 Full Code [id number]  Lance Coon, MD Inpatient   08/31/2015 1600 09/03/2015 2209 Full Code [id number]  Demetrios Loll, MD  ED   07/26/2015 1528 07/26/2015 2136 Full Code [id number]  Dionisio David, MD Inpatient   07/22/2015 1736 07/26/2015 1528 Full Code [id number]  Vaughan Basta, MD Inpatient   06/05/2015 2039 06/07/2015 2003 Full Code [id number]  Bettey Costa, MD Inpatient   05/21/2015 2142 05/24/2015 1626 Full Code [id number]  Henreitta Leber, MD Inpatient   04/24/2015 0855 04/26/2015 2211 Full Code [id number]  Demetrios Loll, MD Inpatient   04/01/2015 1558 04/05/2015 2007 Full Code [id number]  Epifanio Lesches, MD ED   03/06/2015 0805 03/09/2015 2111 Full Code [id number]  Juluis Mire, MD Inpatient   02/19/2015 1614 02/24/2015 1741 Full Code [id number]  Aldean Jewett, MD Inpatient       Prognosis: Poor overall    Thank you for allowing the Palliative Medicine Team to assist in the care of this patient.   Time In: 9:55 Time Out: 10:05 Total Time 70 min Prolonged Time Billed  no       Greater than 50%  of this time was  spent counseling and coordinating care related to the above assessment and plan.  Asencion Gowda, NP  Please contact Palliative Medicine Team phone at [phone] for questions and concerns.

## 2021-05-01 NOTE — Progress Notes (Signed)
[facility]  ROUNDING NOTE   Subjective:   [name] was admitted to [facility] on 04/25/2021 for Sepsis [facility]) [A41.9] Severe sepsis ([place]) [A41.9, R65.20] Fever in other diseases [R50.81]  Remains critically ill Off pressors now Extubated 04/28/2021 Alert and oriented to self and place Asking for dentures to be placed back     Objective:  Vital signs in last 24 hours:  Temp:  [97.3 F (36.3 C)-97.9 F (36.6 C)] 97.6 F (36.4 C) (08/10 0800) Pulse Rate:  [62-83] 70 (08/10 0800) Resp:  [10-22] 10 (08/10 0800) BP: (79-163)/(48-68) 157/66 (08/10 0800) SpO2:  [93 %-100 %] 100 % (08/10 0800) Weight:  [75.5 kg] 75.5 kg (08/10 0500)  Weight change: -5.1 kg Filed Weights   04/29/21 0454 04/30/21 0500 05/01/21 0500  Weight: 83.1 kg 80.6 kg 75.5 kg    Intake/Output: I/O last 3 completed shifts: In: 1081.8 [I.V.:26.8; NG/GT:1055] Out: 2000 [Other:2000]   Intake/Output this shift:  No intake/output data recorded.  Physical Exam: General: Critically ill appearing  Head: Normocephalic, atraumatic. Moist oral mucosal membranes  Lungs:  Coarse breath sounds  Heart: Regular rate and rhythm  Abdomen:  Soft, nontender,   Extremities: ++ peripheral edema.  Neurologic: Able to follow commands and answer simple questions  Skin: No lesions  Access: Right femoral temporary dialysis catheter    Basic Metabolic Panel: Recent Labs  Lab 04/26/21 0415 04/26/21 1932 04/27/21 0417 04/28/21 0447 04/28/21 1718 04/29/21 0411 04/30/21 0512 05/01/21 0433  NA 131*  --    < > 133* 133* 132* 133* 134*  K 5.0  --    < > 2.8* 3.1* 3.1* 3.8 3.6  CL 100  --    < > 101 101 101 99 97*  CO2 24  --    < > 27 27 28 26 27   GLUCOSE 103*  --    < > 114* 155* 145* 100* 193*  BUN 18  --    < > 6* 6* 6* 10 13  CREATININE 4.04*  --    < > 1.27* 1.15* 1.10* 1.59* 1.87*  CALCIUM 7.0*  --    < > 8.5* 8.8* 8.3* 8.2* 8.3*  MG 1.6* 1.6*  --  1.7  --  1.9  --  2.3  PHOS 3.9  --    < > 1.5* 2.1*  2.0* 2.2* 1.8*   < > = values in this interval not displayed.     Liver Function Tests: Recent Labs  Lab 04/25/21 1651 04/26/21 0415 04/27/21 0417 04/28/21 0447 04/28/21 1718 04/29/21 0411 04/30/21 0512 05/01/21 0433  AST 23 23  --   --   --  16  --   --   ALT 14 11  --   --   --  13  --   --   ALKPHOS 137* 100  --   --   --  140*  --   --   BILITOT 0.5 0.8  --   --   --  0.6  --   --   PROT 7.6 5.5*  --   --   --  6.0*  --   --   ALBUMIN 2.3* 1.6*   < > 2.3* 2.9* 2.9*  2.6* 2.4* 2.6*   < > = values in this interval not displayed.    Recent Labs  Lab 04/25/21 1651  LIPASE 20    Recent Labs  Lab 04/26/21 0415  AMMONIA 55*     CBC:  Recent Labs  Lab 04/25/21 1651 04/26/21 0415 04/27/21 0417 04/28/21 0447 04/28/21 1718 04/29/21 0411 04/29/21 1318 04/30/21 0512 05/01/21 0433  WBC 5.7 4.1 11.3* 10.2  --  6.2  --  7.0 6.0  NEUTROABS 4.5 3.4  --   --   --   --   --   --   --   HGB 9.1* 8.1* 7.7* 7.0* 7.5* 7.1* 8.6* 9.2* 9.8*  HCT 28.8* 25.5* 24.1* 21.4* 23.7* 21.9* 26.6* 29.2* 30.7*  MCV 89.7 91.1 88.3 87.3  --  90.1  --  88.5 86.2  PLT 245 188 149* 140*  --  115*  --  107* 123*     Cardiac Enzymes: No results for input(s): CKTOTAL, CKMB, CKMBINDEX, TROPONINI in the last 168 hours.  BNP: Invalid input(s): POCBNP  CBG: Recent Labs  Lab 04/30/21 1637 04/30/21 1916 04/30/21 2315 05/01/21 0407 05/01/21 0728  GLUCAP 100* 126* 142* 172* 21*     Microbiology: Results for orders placed or performed during the hospital encounter of 04/25/21  Culture, blood (Routine x 2)     Status: Abnormal   Collection Time: 04/25/21  4:51 PM   Specimen: BLOOD  Result Value Ref Range Status   Specimen Description   Final    BLOOD LW Performed at [facility], [address], [place], [place] [address]    Special Requests   Final    BOTTLES DRAWN AEROBIC AND ANAEROBIC Blood Culture adequate volume Performed at [facility], [address] [place], [place] [address]    Culture  Setup Time   Final    GRAM VARIABLE COCCOBACILLI AEROBIC BOTTLE ONLY Organism ID to follow CRITICAL RESULT CALLED TO, READ BACK BY AND VERIFIED WITH: [facility] AT 2248 04/26/21.PMF Performed at [facility], [address] [place], [place] [address]    Culture ACINETOBACTER CALCOACETICUS/BAUMANNII COMPLEX (A)  Final   Report Status 04/28/2021 FINAL  Final   Organism ID, Bacteria ACINETOBACTER CALCOACETICUS/BAUMANNII COMPLEX  Final      Susceptibility   Acinetobacter calcoaceticus/baumannii complex - MIC*    CEFTAZIDIME 16 INTERMEDIATE Intermediate     CIPROFLOXACIN <=0.25 SENSITIVE Sensitive     GENTAMICIN <=1 SENSITIVE Sensitive     IMIPENEM <=0.25 SENSITIVE Sensitive     PIP/TAZO 32 INTERMEDIATE Intermediate     TRIMETH/SULFA <=20 SENSITIVE Sensitive     AMPICILLIN/SULBACTAM <=2 SENSITIVE Sensitive     * ACINETOBACTER CALCOACETICUS/BAUMANNII COMPLEX  Blood Culture ID Panel (Reflexed)     Status: Abnormal   Collection Time: 04/25/21  4:51 PM  Result Value Ref Range Status   Enterococcus faecalis NOT DETECTED NOT DETECTED Final   Enterococcus Faecium NOT DETECTED NOT DETECTED Final   Listeria monocytogenes NOT DETECTED NOT DETECTED Final   Staphylococcus species NOT DETECTED NOT DETECTED Final   Staphylococcus aureus (BCID) NOT DETECTED NOT DETECTED Final   Staphylococcus epidermidis NOT DETECTED NOT DETECTED Final   Staphylococcus lugdunensis NOT DETECTED NOT DETECTED Final   Streptococcus species NOT DETECTED NOT DETECTED Final   Streptococcus agalactiae NOT DETECTED NOT DETECTED Final   Streptococcus pneumoniae NOT DETECTED NOT DETECTED Final   Streptococcus pyogenes NOT DETECTED NOT DETECTED Final   A.calcoaceticus-baumannii DETECTED (A) NOT DETECTED Final    Comment: CRITICAL RESULT CALLED TO, READ BACK BY AND VERIFIED WITH: CHARLES SHANLEVER AT 1014 04/26/21.PMF    Bacteroides fragilis NOT DETECTED NOT DETECTED Final    Enterobacterales NOT DETECTED NOT DETECTED Final   Enterobacter cloacae complex NOT DETECTED NOT DETECTED  Final   Escherichia coli NOT DETECTED NOT DETECTED Final   Klebsiella aerogenes NOT DETECTED NOT DETECTED Final   Klebsiella oxytoca NOT DETECTED NOT DETECTED Final   Klebsiella pneumoniae NOT DETECTED NOT DETECTED Final   Proteus species NOT DETECTED NOT DETECTED Final   Salmonella species NOT DETECTED NOT DETECTED Final   Serratia marcescens NOT DETECTED NOT DETECTED Final   Haemophilus influenzae NOT DETECTED NOT DETECTED Final   Neisseria meningitidis NOT DETECTED NOT DETECTED Final   Pseudomonas aeruginosa NOT DETECTED NOT DETECTED Final   Stenotrophomonas maltophilia NOT DETECTED NOT DETECTED Final   Candida albicans NOT DETECTED NOT DETECTED Final   Candida auris NOT DETECTED NOT DETECTED Final   Candida glabrata NOT DETECTED NOT DETECTED Final   Candida krusei NOT DETECTED NOT DETECTED Final   Candida parapsilosis NOT DETECTED NOT DETECTED Final   Candida tropicalis NOT DETECTED NOT DETECTED Final   Cryptococcus neoformans/gattii NOT DETECTED NOT DETECTED Final   CTX-M ESBL NOT DETECTED NOT DETECTED Final   Carbapenem resistance IMP NOT DETECTED NOT DETECTED Final   Carbapenem resistance KPC NOT DETECTED NOT DETECTED Final   Carbapenem resistance NDM NOT DETECTED NOT DETECTED Final   Carbapenem resistance VIM NOT DETECTED NOT DETECTED Final    Comment: Performed at [facility], [address] [place], [place] [address]  Culture, blood (Routine x 2)     Status: Abnormal   Collection Time: 04/25/21  6:38 PM   Specimen: BLOOD  Result Value Ref Range Status   Specimen Description   Final    BLOOD BLOOD LEFT WRIST Performed at [facility], [address] [place], [place] [address]    Special Requests   Final    BOTTLES DRAWN AEROBIC AND ANAEROBIC Blood Culture adequate volume Performed at [facility], [address] [place],  [place] [address]    Culture  Setup Time   Final    GRAM VARIABLE COCCOBACILLI AEROBIC BOTTLE ONLY CRITICAL VALUE NOTED.  VALUE IS CONSISTENT WITH PREVIOUSLY REPORTED AND CALLED VALUE. Performed at [facility], [address] [place], [place] [address]    Culture (A)  Final    ACINETOBACTER CALCOACETICUS/BAUMANNII COMPLEX SUSCEPTIBILITIES PERFORMED ON PREVIOUS CULTURE WITHIN THE LAST 5 DAYS. Performed at [facility], [place] [address], [place], [place] [address]    Report Status 04/28/2021 FINAL  Final  Resp Panel by RT-PCR (Flu A&B, Covid) Nasopharyngeal Swab     Status: None   Collection Time: 04/25/21  6:54 PM   Specimen: Nasopharyngeal Swab; Nasopharyngeal(NP) swabs in vial transport medium  Result Value Ref Range Status   SARS Coronavirus 2 by RT PCR NEGATIVE NEGATIVE Final    Comment: (NOTE) SARS-CoV-2 target nucleic acids are NOT DETECTED.  The SARS-CoV-2 RNA is generally detectable in upper respiratory specimens during the acute phase of infection. The lowest concentration of SARS-CoV-2 viral copies this assay can detect is 138 copies/mL. A negative result does not preclude SARS-Cov-2 infection and should not be used as the sole basis for treatment or other patient management decisions. A negative result may occur with  improper specimen collection/handling, submission of specimen other than nasopharyngeal swab, presence of viral mutation(s) within the areas targeted by this assay, and inadequate number of viral copies(<138 copies/mL). A negative result must be combined with clinical observations, patient history, and epidemiological information. The expected result is Negative.  Fact Sheet for Patients:  EntrepreneurPulse.com.au  Fact Sheet for Healthcare Providers:  IncredibleEmployment.be  This test is no t  yet approved or cleared by the [place] and  has been authorized for detection and/or diagnosis of  SARS-CoV-2 by FDA under an Emergency Use Authorization (EUA). This EUA will remain  in effect (meaning this test can be used) for the duration of the COVID-19 declaration under Section 564(b)(1) of the Act, 21 U.S.C.section 360bbb-3(b)(1), unless the authorization is terminated  or revoked sooner.       Influenza A by PCR NEGATIVE NEGATIVE Final   Influenza B by PCR NEGATIVE NEGATIVE Final    Comment: (NOTE) The Xpert Xpress SARS-CoV-2/FLU/RSV plus assay is intended as an aid in the diagnosis of influenza from Nasopharyngeal swab specimens and should not be used as a sole basis for treatment. Nasal washings and aspirates are unacceptable for Xpert Xpress SARS-CoV-2/FLU/RSV testing.  Fact Sheet for Patients: EntrepreneurPulse.com.au  Fact Sheet for Healthcare Providers: IncredibleEmployment.be  This test is not yet approved or cleared by the [place] FDA and has been authorized for detection and/or diagnosis of SARS-CoV-2 by FDA under an Emergency Use Authorization (EUA). This EUA will remain in effect (meaning this test can be used) for the duration of the COVID-19 declaration under Section 564(b)(1) of the Act, 21 U.S.C. section 360bbb-3(b)(1), unless the authorization is terminated or revoked.  Performed at [facility], [address] [place], [place] [address]   MRSA Next Gen by PCR, Nasal     Status: None   Collection Time: 04/26/21  6:35 PM   Specimen: Nasal Mucosa; Nasal Swab  Result Value Ref Range Status   MRSA by PCR Next Gen NOT DETECTED NOT DETECTED Final    Comment: (NOTE) The GeneXpert MRSA Assay (FDA approved for NASAL specimens only), is one component of a comprehensive MRSA colonization surveillance program. It is not intended to diagnose MRSA infection nor to guide or monitor treatment for MRSA infections. Test performance is not FDA approved in patients less than 34 years old. Performed at [facility], [address] [place], [place] [address]   Cath Tip Culture     Status: None (Preliminary result)   Collection Time: 04/27/21 11:59 AM   Specimen: Catheter Tip; Other  Result Value Ref Range Status   Specimen Description   Final    CATH TIP Performed at [facility], [address] [place], [place] [address]    Special Requests   Final    NONE Performed at [facility], [address], [place], [place] [address]    Culture   Final    NO GROWTH 3 DAYS Performed at [facility], [place] [address], [place], [place] [address]    Report Status PENDING  Incomplete  CULTURE, BLOOD (ROUTINE X 2) w Reflex to ID Panel     Status: None (Preliminary result)   Collection Time: 04/28/21 11:34 AM   Specimen: BLOOD  Result Value Ref Range Status   Specimen Description BLOOD RIGHT AC  Final   Special Requests   Final    BOTTLES DRAWN AEROBIC ONLY Blood Culture results may not be optimal due to an inadequate volume of blood received in culture bottles   Culture   Final    NO GROWTH 3 DAYS Performed at [facility], [address] [place], [place] [address]    Report Status PENDING  Incomplete  CULTURE, BLOOD (ROUTINE X 2) w Reflex to ID Panel     Status: None (Preliminary result)   Collection Time: 04/28/21  5:27 PM   Specimen:  BLOOD  Result Value Ref Range Status   Specimen Description BLOOD LT FINGER IN HAND  Final   Special Requests BOTTLES DRAWN AEROBIC ONLY  BCAV  Final   Culture   Final    NO GROWTH 3 DAYS Performed at [facility], [address], [place] [address]    Report Status PENDING  Incomplete    Coagulation Studies: No results for input(s): LABPROT, INR in the last 72 hours.   Urinalysis: No results for input(s): COLORURINE, LABSPEC, PHURINE, GLUCOSEU, HGBUR, BILIRUBINUR, KETONESUR, PROTEINUR, UROBILINOGEN, NITRITE, LEUKOCYTESUR in the last 72 hours.  Invalid input(s): APPERANCEUR     Imaging: DG Abd 1 View  Result Date: 04/29/2021 CLINICAL DATA:  Enteric tube placement. EXAM: ABDOMEN - 1 VIEW COMPARISON:  Abdominal x-ray dated April 26, 2021. FINDINGS: Enteric tube in the stomach with tip near the distal body. The bowel gas pattern is normal. Small bilateral pleural effusions. IMPRESSION: 1. Enteric tube in the stomach. Electronically Signed   By: Titus Dubin M.D.   On: 04/29/2021 14:21   CT HEAD WO CONTRAST (5MM)  Result Date: 04/29/2021 CLINICAL DATA:  Delirium.  Sepsis. EXAM: CT HEAD WITHOUT CONTRAST TECHNIQUE: Contiguous axial images were obtained from the base of the skull through the vertex without intravenous contrast. COMPARISON:  CT head without contrast 03/29/2021 FINDINGS: Brain: No acute infarct, hemorrhage, or mass lesion is present. The ventricles are of normal size. No significant extraaxial fluid collection is present. Vascular: Atherosclerotic calcifications are present in the cavernous internal carotid arteries bilaterally. No hyperdense vessel is present. Skull: Calvarium is intact. No focal lytic or blastic lesions are present. No significant extracranial soft tissue lesion is present. Sinuses/Orbits: Small fluid level is present in the left sphenoid sinus. Asymmetric fluid is present in the left nasal cavity associated with the NG tube. Sinuses are otherwise clear. Fluid is present in the left mastoid air cells. No obstructing nasopharyngeal lesion is present. Bilateral lens replacements are noted. Globes and orbits are otherwise unremarkable. Other: Healed IMPRESSION: 1. Normal CT appearance of the brain. 2. Small fluid level in the left sphenoid sinus. 3. Fluid in the left mastoid air cells and left nasal cavity associated with the NG tube. No obstructing nasopharyngeal lesion is present. Electronically Signed   By: San Morelle M.D.   On: 04/29/2021 15:35     Medications:    ampicillin-sulbactam (UNASYN) IV     dexmedetomidine (PRECEDEX) IV  infusion Stopped (04/30/21 0128)   feeding supplement (OSMOLITE 1.5 CAL) 1,000 mL (05/01/21 0409)    atorvastatin  20 mg Per Tube QPM   chlorhexidine  15 mL Mouth Rinse BID   Chlorhexidine Gluconate Cloth  6 each Topical Q0600   cholecalciferol  1,000 Units Per Tube Daily   feeding supplement (PROSource TF)  45 mL Per Tube BID   fentaNYL  1 patch Transdermal Q72H   free water  30 mL Per Tube Q4H   heparin  5,000 Units Subcutaneous Q8H   lipase/protease/amylase  36,000 Units Oral TID AC   mouth rinse  15 mL Mouth Rinse q12n4p   midodrine  10 mg Per Tube TID WC   mirtazapine  7.5 mg Per Tube QHS   multivitamin  1 tablet Per Tube QHS   nutrition supplement (JUVEN)  1 packet Per Tube BID BM   QUEtiapine  25 mg Per Tube BID   sevelamer carbonate  800 mg Per Tube TID WC & HS   silver sulfADIAZINE   Topical BID  cyanocobalamin  1,000 mcg Per Tube Daily   acetaminophen **OR** acetaminophen, albuterol, dextrose, docusate sodium, fentaNYL (SUBLIMAZE) injection, heparin sodium (porcine), polyethylene glycol  Assessment/ Plan:  [name] is a 60 y.o. black female with end stage renal disease on hemodialysis for greater than 14 years, hypertension, congestive heart failure, coronary artery disease, diabetes mellitus type II, who is admitted to [facility] on 04/25/2021 for Sepsis ([place]) [A41.9] Severe sepsis ([place]) [A41.9, R65.20] Fever in other diseases [R50.81]   [facility] MWF Lt Permcath 74kg   End Stage Renal Disease on dialysis: CRRT d/c on 8/8; patient is off pressors.   HD/CRRT via temp cath HD today to get her back on her schedule schedule and volume removal  2. Sepsis: with hypotension and fever.  - permcath removed, tip cultures are pending- neg so far - Acinetobacter bacteremia   - off vasopressors; critical care as above.    3. Anemia of chronic kidney disease  Lab Results  Component Value Date   HGB 9.8 (L) 05/01/2021   Scheduled EPO SQ weekly saturday   4.  Secondary Hyperparathyroidism:    Lab Results  Component Value Date   PTH 357 (H) 10/05/2018   CALCIUM 8.3 (L) 05/01/2021   CAION 1.09 (L) 04/13/2020   PHOS 1.8 (L) 05/01/2021       Hold phosphate binders until able to have oral intake.   5.  Hypokalemia In normal range today  6. Volume overload - Volume removal IHD as tolerated    LOS: 6 Hortensia Duffin 8/10/20229:01 AM

## 2021-05-01 NOTE — Progress Notes (Signed)
This patient was dialyzed for 3 hours today, denied complaints during and post HD tx. 2 litres of Uf was achieved as ordered. Right femoral vas cath was used with no issues. Vital signs stable post HD.

## 2021-05-01 NOTE — Progress Notes (Addendum)
Speech Language Pathology Treatment: Dysphagia  Patient Details Name: [name] MRN: [id number] DOB: Feb 18, 1961 Today's Date: 05/01/2021 Time: 3546-5681 SLP Time Calculation (min) (ACUTE ONLY): 60 min  Assessment / Plan / Recommendation Clinical Impression  [name] seen for ongoing assessment of swallowing. She is alert today, verbally responsive and engaged in conversation w/ SLP; [place]. [name] is on [place] O2 support; wbc wnl. Dentures present but edentulous for this tx session d/t ongoing HD session also. Instructed [name] on practicing placing/wearing the Dentures post HD session for diet upgrade tomorrow. Often wrote information to [name] as well for comprehension. Adhesive obtained for the Dentures later. NGT present at beginning of tx session, then pulled by NSG for completion of the tx session and po trials.  [name] explained general aspiration precautions and agreed verbally to the need for following them especially sitting upright for all oral intake and taking SMALL, Single sips Slowly. Supported [name] w/ a more full upright sitting position. She consumed purees, thin liquids via cup/straw, and ice chips.  W/ initial trials w/ NGT in place, inconsistent cough noted x2-3 post po trials. After NSG removed the NGT, no further coughing noted w/ trials, including thin liquids. Suspect impact of NGT in pharyngo-esophageal space. No further overt clinical s/s of aspiration were noted: respiratory status remained calm and unlabored, vocal quality clear b/t trials, o2 sats remained mid-upper 90s. Oral phase appeared [facility] for bolus management and timely A-P transfer for swallowing; oral clearing achieved w/ all consistencies. [name] helped to feed self during trials.  NSG staff to assist [name] in wearing her Dentures this evening to assess comfort after not wearing them for several days. This will be necessary for any further solid foods/diet upgrade as [name] wears them at Baseline when eating at home per her report.    [name] appears at  reduced risk for aspriation when following general aspiration precautions w/ oral intake. She does not have a h/o dysphagia at previous admits. Recommend a Pureed diet (initially until wearing her Dentures securely and comfortably) w/ Thin liquids via Cup preferred for less air swallowed d/t Belching noted. Recommend general aspiration precautions; Pills Whole vs Crushed in Puree; tray setup and positioning assistance for meals. REFLUX precautions. ST services will continue to f/u w/ diet upgrade as appropriate. NSG updated. Precautions posted at bedside.       HPI HPI: [name]  is a 60 y.o. female with medical history significant for end-stage renal disease on hemodialysis on Monday, Wednesday, Friday schedule, diabetes mellitus complicated by peripheral polyneuropathy and gastroparesis,, hyperlipidemia, anemia of chronic kidney disease with baseline hemoglobin 8-10, hypertension, HIatal Hernia who is admitted to [facility] on 04/25/2021 with severe sepsis after presenting from home. Provision of history is limited by the patient's somnolence.  The patient reports 2 days of subjective fever, with temperature max at home over that time of 103 F.  She notes associated crampy abdominal discomfort in the bilateral lower abdominal quadrants, which she states is similar in distribution, quality, and intensity relative to the abdominal discomfort that she was experiencing at the time of hospitalization to [facility] ED approximately 1 month ago at which time she was diagnosed with ESBL Ecoli urinary tract infection.  Patient presented with altered mental status, fever and severe sepsis with septic shock.  Patient was intubated upon admission.  She had Acinetobacter bacteremia and infected permacath.  [name] was orally intubated/extubated on 04/28/2021.      SLP Plan  Continue with current plan of care  Recommendations  Diet recommendations: Dysphagia 1 (puree);Thin liquid Liquids provided via:  Cup;Straw (monitor) Medication Administration: Whole meds with puree (vs need to CRUSH in puree for ease of swallowing) Supervision: Patient able to self feed (setup, support) Compensations: Minimize environmental distractions;Slow rate;Small sips/bites;Lingual sweep for clearance of pocketing;Follow solids with liquid Postural Changes and/or Swallow Maneuvers: Seated upright 90 degrees;Upright 30-60 min after meal;Out of bed for meals                General recommendations:  (dietician f/u) Oral Care Recommendations: Oral care BID;Oral care before and after PO;Staff/trained caregiver to provide oral care (denture care) Follow up Recommendations: Skilled Nursing facility (TBD) SLP Visit Diagnosis: Dysphagia, unspecified (R13.10) (NGT just removed; edentulous currently) Plan: Continue with current plan of care       GO                  [name], [place], [place] Pathologist Rehab Services [phone] [facility] 05/01/2021, 4:16 PM

## 2021-05-01 NOTE — Progress Notes (Signed)
Inpatient Diabetes Program Recommendations  AACE/ADA: New Consensus Statement on Inpatient Glycemic Control   Target Ranges:  Prepandial:   less than 140 mg/dL      Peak postprandial:   less than 180 mg/dL (1-2 hours)      Critically ill patients:  140 - 180 mg/dL   Results for ROCQUEL, ASKREN (MRN [id number]) as of 05/01/2021 11:11  Ref. Range 04/30/2021 07:40 04/30/2021 11:10 04/30/2021 16:37 04/30/2021 19:16 04/30/2021 23:15 05/01/2021 04:07 05/01/2021 07:28 05/01/2021 10:57  Glucose-Capillary Latest Ref Range: 70 - 99 mg/dL 121 (H) 113 (H) 100 (H) 126 (H) 142 (H) 172 (H) 238 (H) 274 (H)   Review of Glycemic Control  Current orders for Inpatient glycemic control: CBGs; Osmolite @ 55 ml/hr  Inpatient Diabetes Program Recommendations:    Insulin: If appropriate for patient's plan of care, may want to consider ordering Novolog 0-6 units Q4H.  Thanks, Barnie Alderman, RN, MSN, CDE Diabetes Coordinator Inpatient Diabetes Program [phone] (Team Pager from 8am to 5pm)

## 2021-05-02 DIAGNOSIS — J9601 Acute respiratory failure with hypoxia: Secondary | ICD-10-CM

## 2021-05-02 LAB — RENAL FUNCTION PANEL
Albumin: 2.6 g/dL — ABNORMAL LOW (ref 3.5–5.0)
Anion gap: 5 (ref 5–15)
BUN: 14 mg/dL (ref 8–23)
CO2: 32 mmol/L (ref 22–32)
Calcium: 8.1 mg/dL — ABNORMAL LOW (ref 8.9–10.3)
Chloride: 98 mmol/L (ref 98–111)
Creatinine, Ser: 1.8 mg/dL — ABNORMAL HIGH (ref 0.44–1.00)
GFR, Estimated: 32 mL/min — ABNORMAL LOW (ref >60)
Glucose, Bld: 161 mg/dL — ABNORMAL HIGH (ref 70–99)
Phosphorus: 1.9 mg/dL — ABNORMAL LOW (ref 2.5–4.6)
Potassium: 3.9 mmol/L (ref 3.5–5.1)
Sodium: 135 mmol/L (ref 135–145)

## 2021-05-02 LAB — GLUCOSE, CAPILLARY
Glucose-Capillary: 122 mg/dL — ABNORMAL HIGH (ref 70–99)
Glucose-Capillary: 128 mg/dL — ABNORMAL HIGH (ref 70–99)
Glucose-Capillary: 146 mg/dL — ABNORMAL HIGH (ref 70–99)
Glucose-Capillary: 148 mg/dL — ABNORMAL HIGH (ref 70–99)
Glucose-Capillary: 168 mg/dL — ABNORMAL HIGH (ref 70–99)
Glucose-Capillary: 209 mg/dL — ABNORMAL HIGH (ref 70–99)

## 2021-05-02 LAB — MAGNESIUM: Magnesium: 2.1 mg/dL (ref 1.7–2.4)

## 2021-05-02 MED ORDER — NEPRO/CARBSTEADY PO LIQD
237.0000 mL | Freq: Two times a day (BID) | ORAL | Status: DC
Start: 2021-05-02 — End: 2021-05-08
  Administered 2021-05-02 – 2021-05-08 (×4): 237 mL via ORAL

## 2021-05-02 MED ORDER — ASCORBIC ACID 500 MG PO TABS
500.0000 mg | ORAL_TABLET | Freq: Two times a day (BID) | ORAL | Status: DC
  Administered 2021-05-02 – 2021-05-10 (×15): 500 mg via ORAL
  Filled 2021-05-02 (×15): qty 1

## 2021-05-02 MED ORDER — POLYVINYL ALCOHOL 1.4 % OP SOLN: 1.0000 [drp] | OPHTHALMIC | Status: DC | PRN

## 2021-05-02 NOTE — Progress Notes (Signed)
Chaplain Maggie made initial visit with patient's son in the hallway outside the room. Room was made for empathetic listening and storytelling. Patient's family is preparing to send the grand daughter off to college. She is the first generation to attend college. Chaplain encouraged family member and extended hospitality. Continued support available per on call chaplain.

## 2021-05-02 NOTE — Progress Notes (Signed)
[facility] Daily Progress Note  Will plan on permcath insertion Monday. Patient will need general anesthesia.  Discussed with Dr. Ellis Parents Kamilya Wakeman PA-C 05/02/2021 12:01 PM

## 2021-05-02 NOTE — Progress Notes (Signed)
[facility]  ROUNDING NOTE   Subjective:   [name] was admitted to [facility] on 04/25/2021 for Sepsis [facility]) [A41.9] Severe sepsis ([place]) [A41.9, R65.20] Fever in other diseases [R50.81]  Extubated 04/28/2021 Alert and oriented  Complains of pain in feet from wounds Frustrated with medications and feels like she is not getting what she should    Objective:  Vital signs in last 24 hours:  Temp:  [97.6 F (36.4 C)-98.6 F (37 C)] 98.6 F (37 C) (08/11 0818) Pulse Rate:  [71-86] 71 (08/11 0818) Resp:  [15-23] 16 (08/11 0818) BP: (135-165)/(55-99) 147/74 (08/11 0818) SpO2:  [97 %-100 %] 100 % (08/11 0818)  Weight change:  Filed Weights   04/29/21 0454 04/30/21 0500 05/01/21 0500  Weight: 83.1 kg 80.6 kg 75.5 kg    Intake/Output: I/O last 3 completed shifts: In: 660 [NG/GT:660] Out: 2000 [Other:2000]   Intake/Output this shift:  No intake/output data recorded.  Physical Exam: General: ill appearing  Head: Normocephalic, atraumatic. Moist oral mucosal membranes  Lungs:  Coarse breath sounds, normal breathing effort  Heart: Regular rate and rhythm  Abdomen:  Soft, nontender,   Extremities: ++ peripheral edema.  Neurologic: Able to follow commands and answer simple questions  Skin: No lesions  Access: Right femoral temporary dialysis catheter    Basic Metabolic Panel: Recent Labs  Lab 04/26/21 1932 04/27/21 0417 04/28/21 0447 04/28/21 1718 04/29/21 0411 04/30/21 0512 05/01/21 0433 05/02/21 0540  NA  --    < > 133* 133* 132* 133* 134* 135  K  --    < > 2.8* 3.1* 3.1* 3.8 3.6 3.9  CL  --    < > 101 101 101 99 97* 98  CO2  --    < > 27 27 28 26 27  32  GLUCOSE  --    < > 114* 155* 145* 100* 193* 161*  BUN  --    < > 6* 6* 6* 10 13 14   CREATININE  --    < > 1.27* 1.15* 1.10* 1.59* 1.87* 1.80*  CALCIUM  --    < > 8.5* 8.8* 8.3* 8.2* 8.3* 8.1*  MG 1.6*  --  1.7  --  1.9  --  2.3 2.1  PHOS  --    < > 1.5* 2.1* 2.0* 2.2* 1.8* 1.9*   < > = values in  this interval not displayed.     Liver Function Tests: Recent Labs  Lab 04/25/21 1651 04/26/21 0415 04/27/21 0417 04/28/21 1718 04/29/21 0411 04/30/21 0512 05/01/21 0433 05/02/21 0540  AST 23 23  --   --  16  --   --   --   ALT 14 11  --   --  13  --   --   --   ALKPHOS 137* 100  --   --  140*  --   --   --   BILITOT 0.5 0.8  --   --  0.6  --   --   --   PROT 7.6 5.5*  --   --  6.0*  --   --   --   ALBUMIN 2.3* 1.6*   < > 2.9* 2.9*  2.6* 2.4* 2.6* 2.6*   < > = values in this interval not displayed.    Recent Labs  Lab 04/25/21 1651  LIPASE 20    Recent Labs  Lab 04/26/21 0415  AMMONIA 55*     CBC: Recent Labs  Lab 04/25/21  1651 04/26/21 0415 04/27/21 0417 04/28/21 0447 04/28/21 1718 04/29/21 0411 04/29/21 1318 04/30/21 0512 05/01/21 0433  WBC 5.7 4.1 11.3* 10.2  --  6.2  --  7.0 6.0  NEUTROABS 4.5 3.4  --   --   --   --   --   --   --   HGB 9.1* 8.1* 7.7* 7.0* 7.5* 7.1* 8.6* 9.2* 9.8*  HCT 28.8* 25.5* 24.1* 21.4* 23.7* 21.9* 26.6* 29.2* 30.7*  MCV 89.7 91.1 88.3 87.3  --  90.1  --  88.5 86.2  PLT 245 188 149* 140*  --  115*  --  107* 123*     Cardiac Enzymes: No results for input(s): CKTOTAL, CKMB, CKMBINDEX, TROPONINI in the last 168 hours.  BNP: Invalid input(s): POCBNP  CBG: Recent Labs  Lab 05/01/21 1057 05/01/21 1658 05/01/21 2009 05/02/21 0007 05/02/21 0819  GLUCAP 274* 180* 199* 209* 128*     Microbiology: Results for orders placed or performed during the hospital encounter of 04/25/21  Culture, blood (Routine x 2)     Status: Abnormal   Collection Time: 04/25/21  4:51 PM   Specimen: BLOOD  Result Value Ref Range Status   Specimen Description   Final    BLOOD LW Performed at [facility], [address] [place], [place] [address]    Special Requests   Final    BOTTLES DRAWN AEROBIC AND ANAEROBIC Blood Culture adequate volume Performed at [facility], [address] [place], [place] [address]     Culture  Setup Time   Final    GRAM VARIABLE COCCOBACILLI AEROBIC BOTTLE ONLY Organism ID to follow CRITICAL RESULT CALLED TO, READ BACK BY AND VERIFIED WITH: [facility] AT 2841 04/26/21.PMF Performed at [facility], [address] [place], [place] [address]    Culture ACINETOBACTER CALCOACETICUS/BAUMANNII COMPLEX (A)  Final   Report Status 04/28/2021 FINAL  Final   Organism ID, Bacteria ACINETOBACTER CALCOACETICUS/BAUMANNII COMPLEX  Final      Susceptibility   Acinetobacter calcoaceticus/baumannii complex - MIC*    CEFTAZIDIME 16 INTERMEDIATE Intermediate     CIPROFLOXACIN <=0.25 SENSITIVE Sensitive     GENTAMICIN <=1 SENSITIVE Sensitive     IMIPENEM <=0.25 SENSITIVE Sensitive     PIP/TAZO 32 INTERMEDIATE Intermediate     TRIMETH/SULFA <=20 SENSITIVE Sensitive     AMPICILLIN/SULBACTAM <=2 SENSITIVE Sensitive     * ACINETOBACTER CALCOACETICUS/BAUMANNII COMPLEX  Blood Culture ID Panel (Reflexed)     Status: Abnormal   Collection Time: 04/25/21  4:51 PM  Result Value Ref Range Status   Enterococcus faecalis NOT DETECTED NOT DETECTED Final   Enterococcus Faecium NOT DETECTED NOT DETECTED Final   Listeria monocytogenes NOT DETECTED NOT DETECTED Final   Staphylococcus species NOT DETECTED NOT DETECTED Final   Staphylococcus aureus (BCID) NOT DETECTED NOT DETECTED Final   Staphylococcus epidermidis NOT DETECTED NOT DETECTED Final   Staphylococcus lugdunensis NOT DETECTED NOT DETECTED Final   Streptococcus species NOT DETECTED NOT DETECTED Final   Streptococcus agalactiae NOT DETECTED NOT DETECTED Final   Streptococcus pneumoniae NOT DETECTED NOT DETECTED Final   Streptococcus pyogenes NOT DETECTED NOT DETECTED Final   A.calcoaceticus-baumannii DETECTED (A) NOT DETECTED Final    Comment: CRITICAL RESULT CALLED TO, READ BACK BY AND VERIFIED WITH: CHARLES SHANLEVER AT 1014 04/26/21.PMF    Bacteroides fragilis NOT DETECTED NOT DETECTED Final   Enterobacterales NOT DETECTED  NOT DETECTED Final   Enterobacter cloacae complex NOT DETECTED NOT DETECTED Final   Escherichia coli  NOT DETECTED NOT DETECTED Final   Klebsiella aerogenes NOT DETECTED NOT DETECTED Final   Klebsiella oxytoca NOT DETECTED NOT DETECTED Final   Klebsiella pneumoniae NOT DETECTED NOT DETECTED Final   Proteus species NOT DETECTED NOT DETECTED Final   Salmonella species NOT DETECTED NOT DETECTED Final   Serratia marcescens NOT DETECTED NOT DETECTED Final   Haemophilus influenzae NOT DETECTED NOT DETECTED Final   Neisseria meningitidis NOT DETECTED NOT DETECTED Final   Pseudomonas aeruginosa NOT DETECTED NOT DETECTED Final   Stenotrophomonas maltophilia NOT DETECTED NOT DETECTED Final   Candida albicans NOT DETECTED NOT DETECTED Final   Candida auris NOT DETECTED NOT DETECTED Final   Candida glabrata NOT DETECTED NOT DETECTED Final   Candida krusei NOT DETECTED NOT DETECTED Final   Candida parapsilosis NOT DETECTED NOT DETECTED Final   Candida tropicalis NOT DETECTED NOT DETECTED Final   Cryptococcus neoformans/gattii NOT DETECTED NOT DETECTED Final   CTX-M ESBL NOT DETECTED NOT DETECTED Final   Carbapenem resistance IMP NOT DETECTED NOT DETECTED Final   Carbapenem resistance KPC NOT DETECTED NOT DETECTED Final   Carbapenem resistance NDM NOT DETECTED NOT DETECTED Final   Carbapenem resistance VIM NOT DETECTED NOT DETECTED Final    Comment: Performed at [facility], [address] [place], [place] [address]  Culture, blood (Routine x 2)     Status: Abnormal   Collection Time: 04/25/21  6:38 PM   Specimen: BLOOD  Result Value Ref Range Status   Specimen Description   Final    BLOOD BLOOD LEFT WRIST Performed at [facility], [address] [place], [place] [address]    Special Requests   Final    BOTTLES DRAWN AEROBIC AND ANAEROBIC Blood Culture adequate volume Performed at [facility], [address] [place], [place] [address]    Culture  Setup Time    Final    GRAM VARIABLE COCCOBACILLI AEROBIC BOTTLE ONLY CRITICAL VALUE NOTED.  VALUE IS CONSISTENT WITH PREVIOUSLY REPORTED AND CALLED VALUE. Performed at [facility], [address] [place], [place] [address]    Culture (A)  Final    ACINETOBACTER CALCOACETICUS/BAUMANNII COMPLEX SUSCEPTIBILITIES PERFORMED ON PREVIOUS CULTURE WITHIN THE LAST 5 DAYS. Performed at [facility], [place] [address] [place], [place] [address]    Report Status 04/28/2021 FINAL  Final  Resp Panel by RT-PCR (Flu A&B, Covid) Nasopharyngeal Swab     Status: None   Collection Time: 04/25/21  6:54 PM   Specimen: Nasopharyngeal Swab; Nasopharyngeal(NP) swabs in vial transport medium  Result Value Ref Range Status   SARS Coronavirus 2 by RT PCR NEGATIVE NEGATIVE Final    Comment: (NOTE) SARS-CoV-2 target nucleic acids are NOT DETECTED.  The SARS-CoV-2 RNA is generally detectable in upper respiratory specimens during the acute phase of infection. The lowest concentration of SARS-CoV-2 viral copies this assay can detect is 138 copies/mL. A negative result does not preclude SARS-Cov-2 infection and should not be used as the sole basis for treatment or other patient management decisions. A negative result may occur with  improper specimen collection/handling, submission of specimen other than nasopharyngeal swab, presence of viral mutation(s) within the areas targeted by this assay, and inadequate number of viral copies(<138 copies/mL). A negative result must be combined with clinical observations, patient history, and epidemiological information. The expected result is Negative.  Fact Sheet for Patients:  EntrepreneurPulse.com.au  Fact Sheet for Healthcare Providers:  IncredibleEmployment.be  This test is no t yet approved or cleared by  the [employer] and  has been authorized for detection and/or diagnosis of SARS-CoV-2 by FDA under an Emergency Use  Authorization (EUA). This EUA will remain  in effect (meaning this test can be used) for the duration of the COVID-19 declaration under Section 564(b)(1) of the Act, 21 U.S.C.section 360bbb-3(b)(1), unless the authorization is terminated  or revoked sooner.       Influenza A by PCR NEGATIVE NEGATIVE Final   Influenza B by PCR NEGATIVE NEGATIVE Final    Comment: (NOTE) The Xpert Xpress SARS-CoV-2/FLU/RSV plus assay is intended as an aid in the diagnosis of influenza from Nasopharyngeal swab specimens and should not be used as a sole basis for treatment. Nasal washings and aspirates are unacceptable for Xpert Xpress SARS-CoV-2/FLU/RSV testing.  Fact Sheet for Patients: EntrepreneurPulse.com.au  Fact Sheet for Healthcare Providers: IncredibleEmployment.be  This test is not yet approved or cleared by the [place] FDA and has been authorized for detection and/or diagnosis of SARS-CoV-2 by FDA under an Emergency Use Authorization (EUA). This EUA will remain in effect (meaning this test can be used) for the duration of the COVID-19 declaration under Section 564(b)(1) of the Act, 21 U.S.C. section 360bbb-3(b)(1), unless the authorization is terminated or revoked.  Performed at [facility], [address] [place], [place] [address]   MRSA Next Gen by PCR, Nasal     Status: None   Collection Time: 04/26/21  6:35 PM   Specimen: Nasal Mucosa; Nasal Swab  Result Value Ref Range Status   MRSA by PCR Next Gen NOT DETECTED NOT DETECTED Final    Comment: (NOTE) The GeneXpert MRSA Assay (FDA approved for NASAL specimens only), is one component of a comprehensive MRSA colonization surveillance program. It is not intended to diagnose MRSA infection nor to guide or monitor treatment for MRSA infections. Test performance is not FDA approved in patients less than 93 years old. Performed at [facility], [address]  [place], [place] [address]   Cath Tip Culture     Status: None   Collection Time: 04/27/21 11:59 AM   Specimen: Catheter Tip; Other  Result Value Ref Range Status   Specimen Description   Final    CATH TIP Performed at [facility], [address], [place], [place] [address]    Special Requests   Final    NONE Performed at [facility], [address], [place], [place] [address]    Culture   Final    NO GROWTH Performed at [facility], [place] [address] [place], [place] [address]    Report Status 05/01/2021 FINAL  Final  CULTURE, BLOOD (ROUTINE X 2) w Reflex to ID Panel     Status: None (Preliminary result)   Collection Time: 04/28/21 11:34 AM   Specimen: BLOOD  Result Value Ref Range Status   Specimen Description BLOOD RIGHT AC  Final   Special Requests   Final    BOTTLES DRAWN AEROBIC ONLY Blood Culture results may not be optimal due to an inadequate volume of blood received in culture bottles   Culture   Final    NO GROWTH 4 DAYS Performed at [facility], [address] [place],  [address]    Report Status PENDING  Incomplete  CULTURE, BLOOD (ROUTINE X 2) w Reflex to ID Panel     Status: None (Preliminary result)   Collection Time: 04/28/21  5:27 PM   Specimen: BLOOD  Result Value Ref Range Status  Specimen Description BLOOD LT FINGER IN HAND  Final   Special Requests BOTTLES DRAWN AEROBIC ONLY  BCAV  Final   Culture   Final    NO GROWTH 4 DAYS Performed at [facility], [address] [place], [place] [address]    Report Status PENDING  Incomplete    Coagulation Studies: No results for input(s): LABPROT, INR in the last 72 hours.   Urinalysis: No results for input(s): COLORURINE, LABSPEC, PHURINE, GLUCOSEU, HGBUR, BILIRUBINUR, KETONESUR, PROTEINUR, UROBILINOGEN, NITRITE, LEUKOCYTESUR in the last 72 hours.  Invalid input(s): APPERANCEUR    Imaging: No results found.   Medications:    ampicillin-sulbactam  (UNASYN) IV 3 g (05/01/21 2215)   feeding supplement (OSMOLITE 1.5 CAL) 1,000 mL (05/01/21 0409)    atorvastatin  20 mg Per Tube QPM   chlorhexidine  15 mL Mouth Rinse BID   Chlorhexidine Gluconate Cloth  6 each Topical Q0600   cholecalciferol  1,000 Units Per Tube Daily   feeding supplement (PROSource TF)  45 mL Per Tube BID   fentaNYL  1 patch Transdermal Q72H   free water  30 mL Per Tube Q4H   heparin  5,000 Units Subcutaneous Q8H   lipase/protease/amylase  36,000 Units Oral TID AC   mouth rinse  15 mL Mouth Rinse q12n4p   midodrine  10 mg Per Tube TID WC   mirtazapine  7.5 mg Per Tube QHS   multivitamin  1 tablet Per Tube QHS   nutrition supplement (JUVEN)  1 packet Per Tube BID BM   QUEtiapine  25 mg Per Tube BID   silver sulfADIAZINE   Topical BID   cyanocobalamin  1,000 mcg Per Tube Daily   acetaminophen **OR** acetaminophen, albuterol, dextrose, docusate sodium, fentaNYL (SUBLIMAZE) injection, heparin sodium (porcine), polyethylene glycol, polyvinyl alcohol  Assessment/ Plan:  Ms. FENNA SEMEL is a 60 y.o. black female with end stage renal disease on hemodialysis for greater than 14 years, hypertension, congestive heart failure, coronary artery disease, diabetes mellitus type II, who is admitted to [facility] on 04/25/2021 for Sepsis ([place]) [A41.9] Severe sepsis ([place]) [A41.9, R65.20] Fever in other diseases [R50.81]   [facility] MWF Lt Permcath 74kg   End Stage Renal Disease on dialysis: CRRT d/c on 8/8; patient is off pressors.   HD/CRRT via temp cath Received dialysis yesterday, UF 2L removed. Next treatment scheduled for Friday Contacted Vascular for permcath placement early next week Palliative care consulted and [place] discussions underway  2. Sepsis: with hypotension and fever.  - permcath removed, tip cultures are pending- neg so far - Acinetobacter bacteremia   - off vasopressors; critical care as above.    3. Anemia of chronic kidney disease  Lab Results   Component Value Date   HGB 9.8 (L) 05/01/2021  Hgb at goal Scheduled EPO SQ weekly saturday   4. Secondary Hyperparathyroidism:    Lab Results  Component Value Date   PTH 357 (H) 10/05/2018   CALCIUM 8.1 (L) 05/02/2021   CAION 1.09 (L) 04/13/2020   PHOS 1.9 (L) 05/02/2021    Calcium and phosphorus not at goal   Hold phosphate binders until able to have oral intake.   5.  Hypokalemia Stable  6. Volume overload - Volume removal IHD as tolerated - 2L removed with dialysis    LOS: 7   8/11/202210:28 AM

## 2021-05-02 NOTE — Plan of Care (Signed)
  Problem: Clinical Measurements: Goal: Ability to maintain clinical measurements within normal limits will improve Outcome: Progressing Goal: Will remain free from infection Outcome: Progressing Goal: Diagnostic test results will improve Outcome: Progressing Goal: Respiratory complications will improve Outcome: Progressing Goal: Cardiovascular complication will be avoided Outcome: Progressing   Problem: Pain Managment: Goal: General experience of comfort will improve Outcome: Progressing   V/S stable except sbp in 160s. Kept on oxygen at 3lpm/ with sats at 99%; reports shortness of breath. Breathing treatment given. Dressing on both heel changed.

## 2021-05-02 NOTE — Progress Notes (Signed)
Daily Progress Note   Patient Name: [name]       Date: 05/02/2021 DOB: 12-Mar-1961  Age: 60 y.o. MRN#: [id number] Attending Physician: Sharen Hones, MD Primary Care Physician: Denton Lank, MD Admit Date: 04/25/2021  Reason for Consultation/Follow-up: Establishing goals of care  Subjective: Patient is sitting up in bed. She discusses being tired, and her pain and suffering. She discusses the importance of caring for her flowers both inside and outside, and her cat. She states her son helps her- but he has a family, and this is a lot for him. She is proud of her granddaughter who son is taking to [facility] tomorrow.   Upon discussing her health, she tells me of a friend who became tired and stopped dialysis and died. She discusses patients that she saw in the nursing home under hospice care who died quickly; she is a retired Technical brewer.   She states she would like to "make it" to September 3rd to be 26, but would be okay if she did not. She states "I've had one hell of a ride." Upon discussing care moving forward, she inquires about having both hospice at home and dialysis at the same time. She is aware of her prognosis without dialysis. Discussed that hospice with dialysis would not be possible. Discussed that an access would be needed to continue dialysis. She states  "why would I want more pokes, and surgeries, and metal in my body? I don't want that."  Nursing entered to provide medication and remained at bedside during remainder of conversation. Patient then states she would like to go home with hospice. Discussed that this would need to be talked about with her son as she would need support at home. She requests that I call him to share her desire to go home with hospice. Explained that I would  contact him and make him aware, but they would need to talk and she would have to share her wishes with him as well.   Called to speak to [place]. [name] discusses frustration with communication. He voices frustration that he was not made aware of her move from ICU overnight, and about plans for a permcath. Discussed the need for a permcath if her intent was to continue dialysis. He states he will speak with his mother regarding care moving forward.  Dr. Roosevelt Locks made aware of conversation.     Length of Stay: 7  Current Medications: Scheduled Meds:  . vitamin C  500 mg Oral BID  . atorvastatin  20 mg Per Tube QPM  . chlorhexidine  15 mL Mouth Rinse BID  . Chlorhexidine Gluconate Cloth  6 each Topical Q0600  . cholecalciferol  1,000 Units Per Tube Daily  . feeding supplement (NEPRO CARB STEADY)  237 mL Oral BID BM  . fentaNYL  1 patch Transdermal Q72H  . heparin  5,000 Units Subcutaneous Q8H  . lipase/protease/amylase  36,000 Units Oral TID AC  . mouth rinse  15 mL Mouth Rinse q12n4p  . midodrine  10 mg Per Tube TID WC  . mirtazapine  7.5 mg Per Tube QHS  . multivitamin  1 tablet Per Tube QHS  . nutrition supplement (JUVEN)  1 packet Per Tube BID BM  . QUEtiapine  25 mg Per Tube BID  . silver sulfADIAZINE   Topical BID  . cyanocobalamin  1,000 mcg Per Tube Daily    Continuous Infusions: . ampicillin-sulbactam (UNASYN) IV 3 g (05/02/21 1145)    PRN Meds: acetaminophen **OR** acetaminophen, albuterol, dextrose, docusate sodium, fentaNYL (SUBLIMAZE) injection, heparin sodium (porcine), polyethylene glycol, polyvinyl alcohol  Physical Exam Pulmonary:     Effort: Pulmonary effort is normal.  Neurological:     Mental Status: She is alert.            Vital Signs: BP (!) 147/74 (BP Location: Left Arm)   Pulse 71   Temp 98.6 F (37 C) (Oral)   Resp 16   Ht 5\' 5"  (1.651 m)   Wt 75.5 kg   SpO2 100%   BMI 27.70 kg/m  SpO2: SpO2: 100 % O2 Device: O2 Device: Nasal Cannula O2 Flow Rate:  O2 Flow Rate (L/min): 4 L/min  Intake/output summary:  Intake/Output Summary (Last 24 hours) at 05/02/2021 1343 Last data filed at 05/01/2021 1530 Gross per 24 hour  Intake --  Output 2000 ml  Net -2000 ml   LBM: Last BM Date: 05/01/21 Baseline Weight: Weight: 52 kg Most recent weight: Weight: 75.5 kg        Patient Active Problem List   Diagnosis Date Noted  . Severe sepsis with septic shock ([place]) 04/25/2021  . HCAP (healthcare-associated pneumonia) 04/25/2021  . Acute metabolic encephalopathy 78/29/5621  . UTI (urinary tract infection) 03/29/2021  . Chronic respiratory failure with hypoxia ([place]) 12/18/2020  . Superior vena caval syndrome 12/14/2020  . Chronic diastolic CHF (congestive heart failure) ([place]) 12/04/2020  . Bradycardia 09/17/2020  . Advanced care planning/counseling discussion   . Discitis of thoracic region   . Fluid overload 08/10/2020  . HLD (hyperlipidemia) 08/10/2020  . Chest pain 08/10/2020  . CAD (coronary artery disease) 08/10/2020  . GERD (gastroesophageal reflux disease) 08/10/2020  . Obesity (BMI 30-39.9) 06/06/2020  . Anemia in ESRD (end-stage renal disease) ([place]) 05/31/2020  . Type 1 diabetes mellitus ([place]) 05/31/2020  . End-stage renal disease ([place]) 05/31/2020  . Hypertensive disorder 05/31/2020  . Arm pain 05/21/2020  . Cellulitis of right leg 05/02/2020  . ESRD (end stage renal disease) ([place]) 04/13/2020  . SOBOE (shortness of breath on exertion) 02/08/2020  . Hematoma of arm, left, subsequent encounter 11/24/2019  . Respiratory failure ([place])   . Shortness of breath   . Complication of arteriovenous dialysis fistula 11/13/2019  . PVD (peripheral vascular disease) ([place]) 11/13/2019  . Severe anemia 11/13/2019  . Pressure ulcer, stage 2 ([place]) 11/13/2019  .  ESRD on hemodialysis ([place])   . Hemorrhagic shock ([place]) 11/11/2019  . Leg pain 10/02/2019  . CAP (community acquired pneumonia) 10/01/2019  . Atherosclerosis of native arteries of  extremity with intermittent claudication ([place]) 05/01/2019  . Right-sided headache   . COPD (chronic obstructive pulmonary disease) ([place]) 10/06/2018  . Syncope 10/04/2018  . Symptomatic anemia 06/18/2018  . Gastroparesis due to DM ([place]) 01/18/2018  . Complication of vascular access for dialysis 12/04/2017  . Osteomyelitis ([name]) 09/30/2017  . Carotid stenosis 06/18/2017  . Bilateral carotid artery stenosis 06/16/2017  . Benign essential HTN 05/19/2017  . CKD (chronic kidney disease) stage 5, GFR less than 15 ml/min (HCC) 05/19/2017  . Hyperlipidemia, mixed 05/19/2017  . Shortness of breath 05/04/2017  . Rash, skin 12/16/2016  . Cellulitis of lower extremity 07/29/2016  . Chronic venous insufficiency 07/29/2016  . Lymphedema 07/29/2016  . TIA (transient ischemic attack) 04/21/2016  . Altered mental status 04/08/2016  . Hyperammonemia ([place]) 04/08/2016  . Elevated troponin 04/08/2016  . Depression 04/08/2016  . Depression, major, recurrent, severe with psychosis ([place]) 04/08/2016  . Blood in stool   . Intractable cyclical vomiting with nausea   . Reflux esophagitis   . Gastritis   . Generalized abdominal pain   . Uncontrollable vomiting   . Major depressive disorder, recurrent episode, moderate ([place]) 03/15/2016  . Adjustment disorder with mixed anxiety and depressed mood 03/15/2016  . Malnutrition of moderate degree 12/01/2015  . Renal mass   . Dyspnea   . Acute renal failure ([place])   . Respiratory failure ([place])   . High temperature 11/14/2015  . Encounter for central line placement   . Encounter for orogastric (OG) tube placement   . Nausea 11/12/2015  . Hyperkalemia 10/03/2015  . Diarrhea, unspecified 07/22/2015  . Pneumonia 05/21/2015  . Hypoglycemia 04/24/2015  . Unresponsiveness 04/24/2015  . Sinus bradycardia 04/24/2015  . Hypothermia 04/24/2015  . Acute hypoxemic respiratory failure ([place]) 04/24/2015  . Acute on chronic diastolic CHF (congestive heart failure) ([place])  04/05/2015  . Diabetic gastroparesis ([place]) 04/05/2015  . Hypokalemia 04/05/2015  . Generalized weakness 04/05/2015  . Acute pulmonary edema ([place]) 04/03/2015  . Nausea and vomiting 04/03/2015  . Hypoglycemia associated with diabetes ([place]) 04/03/2015  . Anemia of chronic disease 04/03/2015  . Secondary hyperparathyroidism ([place]) 04/03/2015  . Pressure ulcer 04/02/2015  . Acute respiratory failure with hypoxia ([place]) 04/01/2015  . Adjustment disorder with anxiety 03/14/2015  . Somatic symptom disorder, mild 03/08/2015  . Coronary artery disease involving native coronary artery of native heart without angina pectoris   . Nausea & vomiting 03/06/2015  . Abdominal pain 03/06/2015  . Diabetes mellitus, type II ([place]) 03/06/2015  . HTN (hypertension) 03/06/2015  . Gastroparesis 02/24/2015  . Pleural effusion 02/19/2015  . Bacteremia due to Enterococcus 02/19/2015  . End-stage renal disease on hemodialysis ([place]) 02/19/2015  . Diarrhea 08/01/2014    Palliative Care Assessment & Plan    Recommendations/Plan: Son to speak with mother about care moving forward. He is aware she is hospice at home or hospice facility appropriate, and is currently requesting hospice at her home.    Code Status:    Code Status Orders  (From admission, onward)           Start     Ordered   04/30/21 1445  Do not attempt resuscitation (DNR)  Continuous       Question Answer Comment  In the event of cardiac or respiratory ARREST Do not call a "code blue"  In the event of cardiac or respiratory ARREST Do not perform Intubation, CPR, defibrillation or ACLS   In the event of cardiac or respiratory ARREST Use medication by any route, position, wound care, and other measures to relive pain and suffering. May use oxygen, suction and manual treatment of airway obstruction as needed for comfort.      04/30/21 1444           Code Status History     Date Active Date Inactive Code Status Order ID Comments  User Context   04/26/2021 1432 04/30/2021 1444 Full Code [id number]  Ottie Glazier, MD ED   04/25/2021 2123 04/26/2021 1432 Full Code [id number]  Rhetta Mura, DO ED   03/29/2021 0244 04/02/2021 2300 Full Code [id number]  Athena Masse, MD ED   12/04/2020 2042 12/17/2020 2147 DNR [id number]  Ileene Musa T, DO ED   12/04/2020 1939 12/04/2020 2042 Full Code [id number]  Orene Desanctis, DO ED   09/17/2020 1431 09/21/2020 1925 Full Code [id number]  Tyna Jaksch, MD ED   08/10/2020 1323 08/17/2020 2223 Full Code [id number]  Ivor Costa, MD ED   06/02/2020 2251 06/25/2020 2316 Full Code [id number]  Elwyn Reach, MD ED   05/22/2020 1201 05/23/2020 2050 Full Code [id number]  Vashti Hey, MD ED   05/02/2020 2057 05/06/2020 1929 Full Code [id number]  Mansy, Arvella Merles, MD ED   04/13/2020 1458 04/14/2020 2239 Full Code [id number]  Sela Hua, PA-C Inpatient   11/24/2019 2038 11/30/2019 1824 Full Code [id number]  Artist [place], MD Inpatient   11/11/2019 0715 11/19/2019 0006 Full Code [id number]  Awilda Bill, NP ED   09/30/2019 2126 10/01/2019 2257 Full Code [id number]  Nolberto Hanlon, MD ED   10/04/2018 1847 10/12/2018 0027 Full Code [id number]  Dustin Flock, MD Inpatient   06/18/2018 0449 06/20/2018 1642 Full Code [id number]  Arta Silence, MD ED   02/12/2018 1312 02/16/2018 0050 Full Code [id number]  Epifanio Lesches, MD ED   01/17/2018 1848 01/21/2018 1341 Full Code [id number]  Vaughan Basta, MD Inpatient   09/30/2017 1804 10/21/2017 2238 Full Code [id number]  Vaughan Basta, MD Inpatient   09/03/2017 2243 09/05/2017 1901 Full Code [id number]  Lance Coon, MD Inpatient   05/04/2017 1833 05/05/2017 2330 Full Code [id number]  Henreitta Leber, MD Inpatient   03/23/2017 1551 03/25/2017 1739 Full Code [id number]  Gladstone Lighter, MD Inpatient   04/21/2016 1559 04/21/2016 1717 Full Code [id number]  Nicholes Mango, MD Inpatient   04/08/2016 1415 04/11/2016 1947 Full Code [id number]  Theodoro Grist, MD  Inpatient   03/15/2016 1322 03/20/2016 0019 Full Code [id number]  Bettey Costa, MD Inpatient   02/16/2016 1409 02/19/2016 2238 Full Code [id number]  Bettey Costa, MD ED   12/17/2015 1139 12/21/2015 2209 Full Code [id number]  Dustin Flock, MD ED   11/12/2015 1602 12/01/2015 1943 Full Code [id number]  Bettey Costa, MD Inpatient   10/07/2015 0937 10/12/2015 1959 Full Code [id number]  Fritzi Mandes, MD ED   10/03/2015 0355 10/05/2015 2230 Full Code [id number]  Lance Coon, MD Inpatient   08/31/2015 1600 09/03/2015 2209 Full Code [id number]  Demetrios Loll, MD ED   07/26/2015 1528 07/26/2015 2136 Full Code [id number]  Dionisio David, MD Inpatient   07/22/2015 1736 07/26/2015 1528 Full Code [id number]  Vaughan Basta, MD Inpatient   06/05/2015 2039 06/07/2015 2003 Full Code [id number]  Bettey Costa, MD Inpatient   05/21/2015 2142 05/24/2015 1626 Full Code [id number]  Henreitta Leber, MD Inpatient   04/24/2015 [id number]  04/26/2015 2211 Full Code [id number]  Demetrios Loll, MD Inpatient   04/01/2015 1558 04/05/2015 2007 Full Code [id number]  Epifanio Lesches, MD ED   03/06/2015 0805 03/09/2015 2111 Full Code [id number]  Juluis Mire, MD Inpatient   02/19/2015 1614 02/24/2015 1741 Full Code [id number]  Aldean Jewett, MD Inpatient       Care plan was discussed with primary MD.   Thank you for allowing the Palliative Medicine Team to assist in the care of this patient.   Time In: 11:00 2:00 Time Out: 11:30 2:40 Total Time 30 min 40 min 70 min total Prolonged Time Billed  yes       Greater than 50%  of this time was spent counseling and coordinating care related to the above assessment and plan.  Asencion Gowda, NP  Please contact Palliative Medicine Team phone at [phone] for questions and concerns.

## 2021-05-02 NOTE — Progress Notes (Signed)
PROGRESS NOTE    [name]  VQM:[id number] DOB: 1961/02/11 DOA: 04/25/2021 PCP: Denton Lank, MD   Chief complaint.  Altered mental status. Brief Narrative:  [name] is a 60 y.o. female with medical history significant for end-stage renal disease on hemodialysis on Monday, Wednesday, Friday schedule, diabetes mellitus complicated by peripheral polyneuropathy and gastroparesis,, hyperlipidemia, anemia of chronic kidney disease with baseline hemoglobin 8-10, hypertension, who is admitted to [facility] on 04/25/2021 with severe sepsis after presenting from home to [facility] ED complaining of fever.  Patient was diagnosed with septic shock, blood culture positive for Acinetobacter calcoaceticus.  This appears to be secondary to dialysis catheter infection.  Patient also developed acute hypoxemic respiratory failure required mechanical ventilation. Permacath was removed on 8/06.  Patient treated with Unasyn, last dose 8/20. Transfer out of ICU on 8/9.    Assessment & Plan:   Principal Problem:   Severe sepsis with septic shock (HCC) Active Problems:   Diabetes mellitus, type II ([place])   Hypoglycemia associated with diabetes ([place])   Generalized weakness   Acute hypoxemic respiratory failure (HCC)   Pressure ulcer, stage 2 (HCC)   ESRD (end stage renal disease) (HCC)   GERD (gastroesophageal reflux disease)   Acute metabolic encephalopathy   HCAP (healthcare-associated pneumonia)    #1.  Septic shock with Acinetobacter septicemia secondary to dialysis catheter infection. Permacath infection. Condition has improved, last dose of Unasyn 8/20.  Will place midline before discharge.   2.  Acute hypoxemic respiratory failure secondary to septic shock. Condition has improved.  3.  Dysphagia. Patient is on dysphagia 1 diet.  Followed by speech therapy.   4.  End-stage renal disease. Hypophosphatemia. Hyponatremia. Followed by nephrology.  Patient currently has a  temporary dialysis catheter, not able to discharge until patient has access.  5.  Anemia of chronic disease. Thrombocytopenia. Stable.   DVT prophylaxis: Heparin Code Status: DNR Family Communication: son updated Disposition Plan:    Status is: Inpatient  Remains inpatient appropriate because:IV treatments appropriate due to intensity of illness or inability to take PO and Inpatient level of care appropriate due to severity of illness  Dispo: The patient is from: Home              Anticipated d/c is to: ??              Patient currently is not medically stable to d/c.   Difficult to place patient No        I/O last 3 completed shifts: In: 660 [NG/GT:660] Out: 2000 [Other:2000] No intake/output data recorded.     Consultants:  Nephrology  Procedures: HD  Antimicrobials: Unasyn.  Subjective: Patient has less confusion today, she does not feel any short of breath.  She has some dysphagia, on dysphagia 1 diet. No nausea vomiting or abdominal pain. No fever chills No headache or dizziness. No chest pain palpitation  Objective: Vitals:   05/01/21 1654 05/01/21 1924 05/01/21 2212 05/02/21 0818  BP: (!) 152/66 (!) 165/65  (!) 147/74  Pulse: 79 78  71  Resp: 20 18  16   Temp: 98.5 F (36.9 C) 98.5 F (36.9 C)  98.6 F (37 C)  TempSrc: Oral Oral  Oral  SpO2: 100% 100% 97% 100%  Weight:      Height:        Intake/Output Summary (Last 24 hours) at 05/02/2021 1240 Last data filed at 05/01/2021 1530 Gross per 24 hour  Intake --  Output 2000 ml  Net -2000 ml   Filed Weights   04/29/21 0454 04/30/21 0500 05/01/21 0500  Weight: 83.1 kg 80.6 kg 75.5 kg    Examination:  General exam: Appears calm and comfortable Respiratory system: Clear to auscultation. Respiratory effort normal. Cardiovascular system: S1 & S2 heard, RRR. No JVD, murmurs, rubs, gallops or clicks. No pedal edema. Gastrointestinal system: Abdomen is nondistended, soft and nontender. No  organomegaly or masses felt. Normal bowel sounds heard. Central nervous system: Alert and oriented x2. No focal neurological deficits. Extremities: Symmetric 5 x 5 power. Skin: No rashes, lesions or ulcers Psychiatry: Mood & affect appropriate.     Data Reviewed: I have personally reviewed following labs and imaging studies  CBC: Recent Labs  Lab 04/25/21 1651 04/26/21 0415 04/27/21 0417 04/28/21 0447 04/28/21 1718 04/29/21 0411 04/29/21 1318 04/30/21 0512 05/01/21 0433  WBC 5.7 4.1 11.3* 10.2  --  6.2  --  7.0 6.0  NEUTROABS 4.5 3.4  --   --   --   --   --   --   --   HGB 9.1* 8.1* 7.7* 7.0* 7.5* 7.1* 8.6* 9.2* 9.8*  HCT 28.8* 25.5* 24.1* 21.4* 23.7* 21.9* 26.6* 29.2* 30.7*  MCV 89.7 91.1 88.3 87.3  --  90.1  --  88.5 86.2  PLT 245 188 149* 140*  --  115*  --  107* 387*   Basic Metabolic Panel: Recent Labs  Lab 04/26/21 1932 04/27/21 0417 04/28/21 0447 04/28/21 1718 04/29/21 0411 04/30/21 0512 05/01/21 0433 05/02/21 0540  NA  --    < > 133* 133* 132* 133* 134* 135  K  --    < > 2.8* 3.1* 3.1* 3.8 3.6 3.9  CL  --    < > 101 101 101 99 97* 98  CO2  --    < > 27 27 28 26 27  32  GLUCOSE  --    < > 114* 155* 145* 100* 193* 161*  BUN  --    < > 6* 6* 6* 10 13 14   CREATININE  --    < > 1.27* 1.15* 1.10* 1.59* 1.87* 1.80*  CALCIUM  --    < > 8.5* 8.8* 8.3* 8.2* 8.3* 8.1*  MG 1.6*  --  1.7  --  1.9  --  2.3 2.1  PHOS  --    < > 1.5* 2.1* 2.0* 2.2* 1.8* 1.9*   < > = values in this interval not displayed.   GFR: Estimated Creatinine Clearance: 33.4 mL/min (A) (by C-G formula based on SCr of 1.8 mg/dL (H)). Liver Function Tests: Recent Labs  Lab 04/25/21 1651 04/26/21 0415 04/27/21 0417 04/28/21 1718 04/29/21 0411 04/30/21 0512 05/01/21 0433 05/02/21 0540  AST 23 23  --   --  16  --   --   --   ALT 14 11  --   --  13  --   --   --   ALKPHOS 137* 100  --   --  140*  --   --   --   BILITOT 0.5 0.8  --   --  0.6  --   --   --   PROT 7.6 5.5*  --   --  6.0*  --    --   --   ALBUMIN 2.3* 1.6*   < > 2.9* 2.9*  2.6* 2.4* 2.6* 2.6*   < > = values in this interval not displayed.   Recent Labs  Lab 04/25/21 1651  LIPASE 20   Recent Labs  Lab 04/26/21 0415  AMMONIA 55*   Coagulation Profile: Recent Labs  Lab 04/25/21 1651  INR 1.2   Cardiac Enzymes: No results for input(s): CKTOTAL, CKMB, CKMBINDEX, TROPONINI in the last 168 hours. BNP (last 3 results) No results for input(s): PROBNP in the last 8760 hours. HbA1C: No results for input(s): HGBA1C in the last 72 hours. CBG: Recent Labs  Lab 05/01/21 1658 05/01/21 2009 05/02/21 0007 05/02/21 0819 05/02/21 1147  GLUCAP 180* 199* 209* 128* 122*   Lipid Profile: No results for input(s): CHOL, HDL, LDLCALC, TRIG, CHOLHDL, LDLDIRECT in the last 72 hours. Thyroid Function Tests: No results for input(s): TSH, T4TOTAL, FREET4, T3FREE, THYROIDAB in the last 72 hours. Anemia Panel: No results for input(s): VITAMINB12, FOLATE, FERRITIN, TIBC, IRON, RETICCTPCT in the last 72 hours. Sepsis Labs: Recent Labs  Lab 04/25/21 1651 04/25/21 1838 04/26/21 0415 04/27/21 0417  PROCALCITON  --  0.81 11.70 13.35  LATICACIDVEN 1.4 1.1  --   --     Recent Results (from the past 240 hour(s))  Culture, blood (Routine x 2)     Status: Abnormal   Collection Time: 04/25/21  4:51 PM   Specimen: BLOOD  Result Value Ref Range Status   Specimen Description   Final    BLOOD LW Performed at [facility], [address], [place], [place] [address]    Special Requests   Final    BOTTLES DRAWN AEROBIC AND ANAEROBIC Blood Culture adequate volume Performed at [facility], [address] [place], [place] [address]    Culture  Setup Time   Final    GRAM VARIABLE COCCOBACILLI AEROBIC BOTTLE ONLY Organism ID to follow CRITICAL RESULT CALLED TO, READ BACK BY AND VERIFIED WITH: [facility] AT 1014 04/26/21.PMF Performed at [facility], [address] [place], [place]  [address]    Culture ACINETOBACTER CALCOACETICUS/BAUMANNII COMPLEX (A)  Final   Report Status 04/28/2021 FINAL  Final   Organism ID, Bacteria ACINETOBACTER CALCOACETICUS/BAUMANNII COMPLEX  Final      Susceptibility   Acinetobacter calcoaceticus/baumannii complex - MIC*    CEFTAZIDIME 16 INTERMEDIATE Intermediate     CIPROFLOXACIN <=0.25 SENSITIVE Sensitive     GENTAMICIN <=1 SENSITIVE Sensitive     IMIPENEM <=0.25 SENSITIVE Sensitive     PIP/TAZO 32 INTERMEDIATE Intermediate     TRIMETH/SULFA <=20 SENSITIVE Sensitive     AMPICILLIN/SULBACTAM <=2 SENSITIVE Sensitive     * ACINETOBACTER CALCOACETICUS/BAUMANNII COMPLEX  Blood Culture ID Panel (Reflexed)     Status: Abnormal   Collection Time: 04/25/21  4:51 PM  Result Value Ref Range Status   Enterococcus faecalis NOT DETECTED NOT DETECTED Final   Enterococcus Faecium NOT DETECTED NOT DETECTED Final   Listeria monocytogenes NOT DETECTED NOT DETECTED Final   Staphylococcus species NOT DETECTED NOT DETECTED Final   Staphylococcus aureus (BCID) NOT DETECTED NOT DETECTED Final   Staphylococcus epidermidis NOT DETECTED NOT DETECTED Final   Staphylococcus lugdunensis NOT DETECTED NOT DETECTED Final   Streptococcus species NOT DETECTED NOT DETECTED Final   Streptococcus agalactiae NOT DETECTED NOT DETECTED Final   Streptococcus pneumoniae NOT DETECTED NOT DETECTED Final   Streptococcus pyogenes NOT DETECTED NOT DETECTED Final   A.calcoaceticus-baumannii DETECTED (A) NOT DETECTED Final    Comment: CRITICAL RESULT CALLED TO, READ BACK BY AND VERIFIED WITH: CHARLES SHANLEVER AT 1014 04/26/21.PMF    Bacteroides fragilis NOT DETECTED NOT DETECTED Final   Enterobacterales NOT DETECTED NOT DETECTED Final   Enterobacter  cloacae complex NOT DETECTED NOT DETECTED Final   Escherichia coli NOT DETECTED NOT DETECTED Final   Klebsiella aerogenes NOT DETECTED NOT DETECTED Final   Klebsiella oxytoca NOT DETECTED NOT DETECTED Final   Klebsiella pneumoniae NOT  DETECTED NOT DETECTED Final   Proteus species NOT DETECTED NOT DETECTED Final   Salmonella species NOT DETECTED NOT DETECTED Final   Serratia marcescens NOT DETECTED NOT DETECTED Final   Haemophilus influenzae NOT DETECTED NOT DETECTED Final   Neisseria meningitidis NOT DETECTED NOT DETECTED Final   Pseudomonas aeruginosa NOT DETECTED NOT DETECTED Final   Stenotrophomonas maltophilia NOT DETECTED NOT DETECTED Final   Candida albicans NOT DETECTED NOT DETECTED Final   Candida auris NOT DETECTED NOT DETECTED Final   Candida glabrata NOT DETECTED NOT DETECTED Final   Candida krusei NOT DETECTED NOT DETECTED Final   Candida parapsilosis NOT DETECTED NOT DETECTED Final   Candida tropicalis NOT DETECTED NOT DETECTED Final   Cryptococcus neoformans/gattii NOT DETECTED NOT DETECTED Final   CTX-M ESBL NOT DETECTED NOT DETECTED Final   Carbapenem resistance IMP NOT DETECTED NOT DETECTED Final   Carbapenem resistance KPC NOT DETECTED NOT DETECTED Final   Carbapenem resistance NDM NOT DETECTED NOT DETECTED Final   Carbapenem resistance VIM NOT DETECTED NOT DETECTED Final    Comment: Performed at [facility], [address] [place], [place] [address]  Culture, blood (Routine x 2)     Status: Abnormal   Collection Time: 04/25/21  6:38 PM   Specimen: BLOOD  Result Value Ref Range Status   Specimen Description   Final    BLOOD BLOOD LEFT WRIST Performed at [facility], [address] [place], [place] [address]    Special Requests   Final    BOTTLES DRAWN AEROBIC AND ANAEROBIC Blood Culture adequate volume Performed at [facility], [address] [place], [place] [address]    Culture  Setup Time   Final    GRAM VARIABLE COCCOBACILLI AEROBIC BOTTLE ONLY CRITICAL VALUE NOTED.  VALUE IS CONSISTENT WITH PREVIOUSLY REPORTED AND CALLED VALUE. Performed at [facility], [address] [place], [place] [address]    Culture (A)  Final    ACINETOBACTER  CALCOACETICUS/BAUMANNII COMPLEX SUSCEPTIBILITIES PERFORMED ON PREVIOUS CULTURE WITHIN THE LAST 5 DAYS. Performed at [facility], [place] [address] [place], [place] [address]    Report Status 04/28/2021 FINAL  Final  Resp Panel by RT-PCR (Flu A&B, Covid) Nasopharyngeal Swab     Status: None   Collection Time: 04/25/21  6:54 PM   Specimen: Nasopharyngeal Swab; Nasopharyngeal(NP) swabs in vial transport medium  Result Value Ref Range Status   SARS Coronavirus 2 by RT PCR NEGATIVE NEGATIVE Final    Comment: (NOTE) SARS-CoV-2 target nucleic acids are NOT DETECTED.  The SARS-CoV-2 RNA is generally detectable in upper respiratory specimens during the acute phase of infection. The lowest concentration of SARS-CoV-2 viral copies this assay can detect is 138 copies/mL. A negative result does not preclude SARS-Cov-2 infection and should not be used as the sole basis for treatment or other patient management decisions. A negative result may occur with  improper specimen collection/handling, submission of specimen other than nasopharyngeal swab, presence of viral mutation(s) within the areas targeted by this assay, and inadequate number of viral copies(<138 copies/mL). A negative result must be combined with clinical observations, patient history, and epidemiological information. The expected result is Negative.  Fact Sheet for Patients:  EntrepreneurPulse.com.au  Fact Sheet for Healthcare Providers:  IncredibleEmployment.be  This test is no t yet approved or cleared by the [place] and  has been authorized for detection and/or diagnosis of SARS-CoV-2 by FDA under an Emergency Use Authorization (EUA). This EUA will remain  in effect (meaning this test can be used) for the duration of the COVID-19 declaration under Section 564(b)(1) of the Act, 21 U.S.C.section 360bbb-3(b)(1), unless the authorization is terminated  or revoked sooner.        Influenza A by PCR NEGATIVE NEGATIVE Final   Influenza B by PCR NEGATIVE NEGATIVE Final    Comment: (NOTE) The Xpert Xpress SARS-CoV-2/FLU/RSV plus assay is intended as an aid in the diagnosis of influenza from Nasopharyngeal swab specimens and should not be used as a sole basis for treatment. Nasal washings and aspirates are unacceptable for Xpert Xpress SARS-CoV-2/FLU/RSV testing.  Fact Sheet for Patients: EntrepreneurPulse.com.au  Fact Sheet for Healthcare Providers: IncredibleEmployment.be  This test is not yet approved or cleared by the [place] FDA and has been authorized for detection and/or diagnosis of SARS-CoV-2 by FDA under an Emergency Use Authorization (EUA). This EUA will remain in effect (meaning this test can be used) for the duration of the COVID-19 declaration under Section 564(b)(1) of the Act, 21 U.S.C. section 360bbb-3(b)(1), unless the authorization is terminated or revoked.  Performed at [facility], [address] [place], [place] [address]   MRSA Next Gen by PCR, Nasal     Status: None   Collection Time: 04/26/21  6:35 PM   Specimen: Nasal Mucosa; Nasal Swab  Result Value Ref Range Status   MRSA by PCR Next Gen NOT DETECTED NOT DETECTED Final    Comment: (NOTE) The GeneXpert MRSA Assay (FDA approved for NASAL specimens only), is one component of a comprehensive MRSA colonization surveillance program. It is not intended to diagnose MRSA infection nor to guide or monitor treatment for MRSA infections. Test performance is not FDA approved in patients less than 18 years old. Performed at [facility], [address] [place], [place] [address]   Cath Tip Culture     Status: None   Collection Time: 04/27/21 11:59 AM   Specimen: Catheter Tip; Other  Result Value Ref Range Status   Specimen Description   Final    CATH TIP Performed at [facility], [address], [place],  [place] [address]    Special Requests   Final    NONE Performed at [facility], [address], [place], [place] [address]    Culture   Final    NO GROWTH Performed at [facility], [place] [address], [place], [place] [address]    Report Status 05/01/2021 FINAL  Final  CULTURE, BLOOD (ROUTINE X 2) w Reflex to ID Panel     Status: None (Preliminary result)   Collection Time: 04/28/21 11:34 AM   Specimen: BLOOD  Result Value Ref Range Status   Specimen Description BLOOD RIGHT AC  Final   Special Requests   Final    BOTTLES DRAWN AEROBIC ONLY Blood Culture results may not be optimal due to an inadequate volume of blood received in culture bottles   Culture   Final    NO GROWTH 4 DAYS Performed at [facility], [address] [place], [place] [address]    Report Status PENDING  Incomplete  CULTURE, BLOOD (ROUTINE X 2) w Reflex to ID Panel     Status: None (Preliminary result)   Collection Time: 04/28/21  5:27  PM   Specimen: BLOOD  Result Value Ref Range Status   Specimen Description BLOOD LT FINGER IN HAND  Final   Special Requests BOTTLES DRAWN AEROBIC ONLY  BCAV  Final   Culture   Final    NO GROWTH 4 DAYS Performed at [facility], [address], [place], [place] [address]    Report Status PENDING  Incomplete         Radiology Studies: No results found.      Scheduled Meds:  atorvastatin  20 mg Per Tube QPM   chlorhexidine  15 mL Mouth Rinse BID   Chlorhexidine Gluconate Cloth  6 each Topical Q0600   cholecalciferol  1,000 Units Per Tube Daily   feeding supplement (PROSource TF)  45 mL Per Tube BID   fentaNYL  1 patch Transdermal Q72H   free water  30 mL Per Tube Q4H   heparin  5,000 Units Subcutaneous Q8H   lipase/protease/amylase  36,000 Units Oral TID AC   mouth rinse  15 mL Mouth Rinse q12n4p   midodrine  10 mg Per Tube TID WC   mirtazapine  7.5 mg Per Tube QHS   multivitamin  1 tablet Per Tube QHS   nutrition supplement  (JUVEN)  1 packet Per Tube BID BM   QUEtiapine  25 mg Per Tube BID   silver sulfADIAZINE   Topical BID   cyanocobalamin  1,000 mcg Per Tube Daily   Continuous Infusions:  ampicillin-sulbactam (UNASYN) IV 3 g (05/02/21 1145)   feeding supplement (OSMOLITE 1.5 CAL) 1,000 mL (05/01/21 0409)     LOS: 7 days    Time spent: 32 minutes.  More than 50% time spent on direct patient care.    Sharen Hones, MD [facility]   To contact the attending provider between 7A-7P or the covering provider during after hours 7P-7A, please log into the web site [email or web address] and access using universal [place] password for that web site. If you do not have the password, please call the hospital operator.  05/02/2021, 12:40 PM

## 2021-05-02 NOTE — Progress Notes (Signed)
Per Asencion Gowda, NP pt will be allowed three visitors today from family.

## 2021-05-02 NOTE — Progress Notes (Signed)
Initial Nutrition Assessment  DOCUMENTATION CODES:   Obesity unspecified  INTERVENTION:   Nepro Shake po BID, each supplement provides 425 kcal and 19 grams protein  Magic cup TID with meals, each supplement provides 290 kcal and 9 grams of protein  Rena-vit po daily   Vitamin C 500mg po BID  Pt at high refeed risk; recommend monitor potassium, magnesium and phosphorus labs daily until stable  NUTRITION DIAGNOSIS:   Inadequate oral intake related to acute illness as evidenced by NPO status. -resolving   GOAL:   Patient will meet greater than or equal to 90% of their needs -previously met with tube feeds   MONITOR:   PO intake, Supplement acceptance, Labs, Weight trends, Skin, I & O's  ASSESSMENT:   60 y/o female with h/o CHF, depression, anxiety, DM, COPD, ESRD on HD, HOH, MI, hiatal hernia, gastroparesis and renal cancer who is admitted with septic shock and bacteremia secondary to central line.  Pt seen by SLP 8/10 and placed on a dysphagia 1 diet. NGT removed yesterday. Pt with poor oral intake in hospital. RD will add supplements and vitamins to help pt meet her estimated needs and to support wound healing. Pt continues to refeed in hospital. Per chart, pt is down 15lbs(8%) since admit; pt down 8lbs from his UBW. Palliative care follow for GOC.   Medications reviewed and include: D3, heparin, creon, rena-vit, B12, unasyn  Labs reviewed: K 3.9 wnl, creat 1.80(H), P 1.9(L), Mg 2.1 wnl Hgb 9.8(L), Hct 30.7(L) Cbgs- 209, 128, 122 x 24 hrs  Diet Order:   Diet Order             DIET - DYS 1 Room service appropriate? Yes with Assist; Fluid consistency: Thin  Diet effective now                  EDUCATION NEEDS:   No education needs have been identified at this time  Skin:  Skin Assessment: Reviewed RN Assessment (Bilateral foot wounds, Stage II sacrum)  Last BM:  8/10- type 6  Height:   Ht Readings from Last 1 Encounters:  04/27/21 5' 5" (1.651 m)     Weight:   Wt Readings from Last 1 Encounters:  05/01/21 75.5 kg    Ideal Body Weight:  56.8 kg  BMI:  Body mass index is 27.7 kg/m.  Estimated Nutritional Needs:   Kcal:  1800-2100kcal/day  Protein:  90-105g/day  Fluid:  UOP +1L    MS, RD, LDN Please refer to AMION for RD and/or RD on-call/weekend/after hours pager  

## 2021-05-03 DIAGNOSIS — D631 Anemia in chronic kidney disease: Secondary | ICD-10-CM

## 2021-05-03 DIAGNOSIS — D696 Thrombocytopenia, unspecified: Secondary | ICD-10-CM

## 2021-05-03 DIAGNOSIS — R7881 Bacteremia: Secondary | ICD-10-CM | POA: Diagnosis not present

## 2021-05-03 LAB — GLUCOSE, CAPILLARY
Glucose-Capillary: 115 mg/dL — ABNORMAL HIGH (ref 70–99)
Glucose-Capillary: 108 mg/dL — ABNORMAL HIGH (ref 70–99)
Glucose-Capillary: 93 mg/dL (ref 70–99)
Glucose-Capillary: 114 mg/dL — ABNORMAL HIGH (ref 70–99)
Glucose-Capillary: 105 mg/dL — ABNORMAL HIGH (ref 70–99)

## 2021-05-03 LAB — CULTURE, BLOOD (ROUTINE X 2)
Culture: NO GROWTH
Culture: NO GROWTH

## 2021-05-03 MED ORDER — DIPHENHYDRAMINE HCL 25 MG PO CAPS
25.0000 mg | ORAL_CAPSULE | Freq: Four times a day (QID) | ORAL | Status: DC | PRN
  Administered 2021-05-03 – 2021-05-10 (×14): 25 mg via ORAL
  Filled 2021-05-03 (×13): qty 1

## 2021-05-03 MED ORDER — OXYCODONE-ACETAMINOPHEN 5-325 MG PO TABS
1.0000 | ORAL_TABLET | Freq: Four times a day (QID) | ORAL | Status: DC | PRN
  Administered 2021-05-03 – 2021-05-10 (×15): 1 via ORAL
  Filled 2021-05-03 (×15): qty 1

## 2021-05-03 NOTE — Progress Notes (Signed)
Post HD vitals 

## 2021-05-03 NOTE — Progress Notes (Addendum)
Physical Therapy Evaluation Patient Details Name: [name] MRN: [id number] DOB: 1960/09/26 Today's Date: 05/03/2021   History of Present Illness  Pt is a 60 y/o F with PMH: ESRD on HD MWF, DM complicated by peripheral polyneuropathy and gastroparesis, chronic dHF, CAD, and PVD who presented to [facility] d/t fever. Pt adm for septic shock. This appears to be secondary to dialysis catheter infection.  Patient also developed acute hypoxemic respiratory failure required mechanical ventilation. Extub 8/7, out of ICU 8/9.  Clinical Impression  Pt seated EOB working with OT at beginning of treatment session. Pt's PLOF provided by OT report with mod-I for functional mobility with SPC or RW depending on distance walked. Pt is HOH but able to follow one-step commands with multi-modal cues.   Pt requires min-A for rolling with cues for reaching for bed rails and max-A +2 physical assist for sit > supine and scooting in bed. RW, max-A + 2 physical assist for sit <> stand with poor pt tolerance and fear when standing from elevated surface. Pt was unable to move feet independently when attempting to march. Due to change in PLOF, SNF is recommended at discharge pending pt/family establishment of goals of care. Skilled PT intervention is indicated to address deficits in function, mobility, and to return to PLOF as able.        Follow Up Recommendations SNF    Equipment Recommendations  Other (comment) (TBD next venue of care)    Recommendations for Other Services       Precautions / Restrictions Precautions Precautions: Fall Restrictions Weight Bearing Restrictions: No      Mobility  Bed Mobility Overal bed mobility: Needs Assistance Bed Mobility: Sit to Supine;Rolling Rolling: Min assist   Supine to sit: Max assist;HOB elevated Sit to supine: Max assist;+2 for physical assistance   General bed mobility comments: Pt is able to grab bed rails and 3/4 turn for rolling with min-A for rolling  completion    Transfers Overall transfer level: Needs assistance Equipment used: Rolling walker (2 wheeled) Transfers: Sit to/from Stand Sit to Stand: Max assist;+2 physical assistance;From elevated surface         General transfer comment: increased time/effort  Ambulation/Gait                Stairs            Wheelchair Mobility    Modified Rankin (Stroke Patients Only)       Balance Overall balance assessment: Needs assistance Sitting-balance support: Feet supported;Bilateral upper extremity supported Sitting balance-Leahy Scale: Fair Sitting balance - Comments: Able to maintain static sitting without support with BUE, intermittent unilateral UE   Standing balance support: Bilateral upper extremity supported Standing balance-Leahy Scale: Zero Standing balance comment: Pt was fearful with standing w/ posterior LE against bed for support & MOD-A w/ RW for physical assist                             Pertinent Vitals/Pain Pain Assessment: Faces Faces Pain Scale: Hurts little more Pain Location: back Pain Descriptors / Indicators: Aching;Grimacing Pain Intervention(s): Limited activity within patient's tolerance;Monitored during session;Repositioned    Home Living Family/patient expects to be discharged to:: Private residence Living Arrangements: Alone Available Help at Discharge: Family;Available PRN/intermittently Type of Home: Apartment Home Access: Level entry     Home Layout: One level Home Equipment: Walker - 4 wheels;Cane - single point;Cane - quad;Wheelchair - manual;Toilet riser;Walker - 2 wheels;Other (comment)  Additional Comments: Pt's son visits daily. Neighbor checks in frequently. Uses van service to get to HD.    Prior Function Level of Independence: Needs assistance   Gait / Transfers Assistance Needed: Pt utilizes a cane or RW for household and community distances.  ADL's / Homemaking Assistance Needed: States she was  INDEP with ADLs, sponge bathes. Able to cook, clean and do laundry. Gets assist for transportation and family assist for groceries.  Comments: sleeps in lift chair     Hand Dominance   Dominant Hand: Left    Extremity/Trunk Assessment   Upper Extremity Assessment Upper Extremity Assessment: Generalized weakness    Lower Extremity Assessment Lower Extremity Assessment: Generalized weakness       Communication   Communication: No difficulties  Cognition Arousal/Alertness: Awake/alert Behavior During Therapy: WFL for tasks assessed/performed Overall Cognitive Status: Difficult to assess                                 General Comments: Pt is HOH but able to follow 1-step commands with repetitive multi-modal cues.      General Comments      Exercises Other Exercises Other Exercises: PT provided pericare and linen change, rolling x 2 with cues for technique, min-A   Assessment/Plan    PT Assessment Patient needs continued PT services  PT Problem List Decreased strength;Decreased range of motion;Decreased activity tolerance;Decreased balance;Decreased mobility;Decreased coordination       PT Treatment Interventions Gait training;Stair training;Functional mobility training;Therapeutic activities;Therapeutic exercise;Balance training;Neuromuscular re-education    PT Goals (Current goals can be found in the Care Plan section)  Acute Rehab PT Goals Patient Stated Goal: To sit in chair  and eat PT Goal Formulation: With patient Time For Goal Achievement: 05/17/21 Potential to Achieve Goals: Good    Frequency Min 2X/week   Barriers to discharge        Co-evaluation   Reason for Co-Treatment: Complexity of the patient's impairments (multi-system involvement);To address functional/ADL transfers PT goals addressed during session: Mobility/safety with mobility OT goals addressed during session: ADL's and self-care;Proper use of Adaptive equipment and DME        AM-PAC PT "6 Clicks" Mobility  Outcome Measure Help needed turning from your back to your side while in a flat bed without using bedrails?: A Little Help needed moving from lying on your back to sitting on the side of a flat bed without using bedrails?: A Lot Help needed moving to and from a bed to a chair (including a wheelchair)?: A Lot Help needed standing up from a chair using your arms (e.g., wheelchair or bedside chair)?: A Lot Help needed to walk in hospital room?: Total Help needed climbing 3-5 steps with a railing? : Total 6 Click Score: 11    End of Session Equipment Utilized During Treatment: Gait belt Activity Tolerance: Patient tolerated treatment well Patient left: in bed;with call bell/phone within reach;with bed alarm set   PT Visit Diagnosis: Muscle weakness (generalized) (M62.81)    Time: 4585-9292 PT Time Calculation (min) (ACUTE ONLY): 23 min   Charges:             [employer], SPT

## 2021-05-03 NOTE — Progress Notes (Signed)
Pre HD RN assessment 

## 2021-05-03 NOTE — TOC Initial Note (Signed)
Transition of Care [facility]) - Initial/Assessment Note    Patient Details  Name: [name] MRN: [id number] Date of Birth: 02-14-1961  Transition of Care [facility]) CM/SW Contact:    Beverly Sessions, RN Phone Number: 05/03/2021, 3:00 PM  Clinical Narrative:                  Patient seen by [facility] 03/22/21. See note below "Patient is a 60 year old female and is alert and oriented. Patient states that she lives alone, has dialysis 3x per week-[facility] in [place] ([place], [place], Friday). Patient's primary care doctor is Dr. Posey Pronto at the [facility]. [facility] [place].  Patient has no services in the home at this time but is working on getting an aid for 2 hours a day. Patient describes having a positive support system including her son and driver who not only transports her to dialysis and other medical appointments but helps with other errands as well. Patient refuses SNF at this time, however is agreeable to [facility] services post discharge.  Transition of Care to work on setting up [facility] services for patient"    05/03/21 Palliative assisting patient with goals of care.  Not determined at this time if patient will stop HD and transition to hospice.  Or if she will have a perm cath placed on Monday and continue HD.  If patient chooses to continue HD will need to discuss SNF recommendation with patient        Patient Goals and CMS Choice        Expected Discharge Plan and Services                                                Prior Living Arrangements/Services                       Activities of Daily Living      Permission Sought/Granted                  Emotional Assessment              Admission diagnosis:  Sepsis ([place]) [A41.9] Severe sepsis ([place]) [A41.9, R65.20] Fever in other diseases [R50.81] Patient Active Problem List   Diagnosis Date Noted   Anemia of chronic kidney failure 05/03/2021   Thrombocytopenia ([place])  05/03/2021   Severe sepsis with septic shock ([place]) 04/25/2021   HCAP (healthcare-associated pneumonia) 68/34/1962   Acute metabolic encephalopathy 22/97/9892   UTI (urinary tract infection) 03/29/2021   Chronic respiratory failure with hypoxia ([place]) 12/18/2020   Superior vena caval syndrome 12/14/2020   Chronic diastolic CHF (congestive heart failure) ([place]) 12/04/2020   Bradycardia 09/17/2020   Advanced care planning/counseling discussion    Discitis of thoracic region    Fluid overload 08/10/2020   HLD (hyperlipidemia) 08/10/2020   Chest pain 08/10/2020   CAD (coronary artery disease) 08/10/2020   GERD (gastroesophageal reflux disease) 08/10/2020   Obesity (BMI 30-39.9) 06/06/2020   Anemia in ESRD (end-stage renal disease) ([place]) 05/31/2020   Type 1 diabetes mellitus ([place]) 05/31/2020   End-stage renal disease ([place]) 05/31/2020   Hypertensive disorder 05/31/2020   Arm pain 05/21/2020   Cellulitis of right leg 05/02/2020   ESRD (end stage renal disease) ([place]) 04/13/2020   SOBOE (shortness of breath on exertion) 02/08/2020  Hematoma of arm, left, subsequent encounter 11/24/2019   Respiratory failure (HCC)    Shortness of breath    Complication of arteriovenous dialysis fistula 11/13/2019   PVD (peripheral vascular disease) ([place]) 11/13/2019   Severe anemia 11/13/2019   Pressure ulcer, stage 2 ([place]) 11/13/2019   ESRD on hemodialysis ([place])    Hemorrhagic shock ([place]) 11/11/2019   Leg pain 10/02/2019   CAP (community acquired pneumonia) 10/01/2019   Atherosclerosis of native arteries of extremity with intermittent claudication ([place]) 05/01/2019   Right-sided headache    COPD (chronic obstructive pulmonary disease) ([place]) 10/06/2018   Syncope 10/04/2018   Symptomatic anemia 06/18/2018   Gastroparesis due to DM ([place]) 70/35/0093   Complication of vascular access for dialysis 12/04/2017   Osteomyelitis ([place]) 09/30/2017   Carotid stenosis 06/18/2017   Bilateral carotid artery stenosis  06/16/2017   Benign essential HTN 05/19/2017   CKD (chronic kidney disease) stage 5, GFR less than 15 ml/min ([place]) 05/19/2017   Hyperlipidemia, mixed 05/19/2017   Shortness of breath 05/04/2017   Rash, skin 12/16/2016   Cellulitis of lower extremity 07/29/2016   Chronic venous insufficiency 07/29/2016   Lymphedema 07/29/2016   TIA (transient ischemic attack) 04/21/2016   Altered mental status 04/08/2016   Hyperammonemia ([place]) 04/08/2016   Elevated troponin 04/08/2016   Depression 04/08/2016   Depression, major, recurrent, severe with psychosis ([place]) 04/08/2016   Blood in stool    Intractable cyclical vomiting with nausea    Reflux esophagitis    Gastritis    Generalized abdominal pain    Uncontrollable vomiting    Major depressive disorder, recurrent episode, moderate ([place]) 03/15/2016   Adjustment disorder with mixed anxiety and depressed mood 03/15/2016   Malnutrition of moderate degree 12/01/2015   Renal mass    Dyspnea    Acute renal failure ([place])    Respiratory failure ([place])    High temperature 11/14/2015   Encounter for central line placement    Encounter for orogastric (OG) tube placement    Nausea 11/12/2015   Hyperkalemia 10/03/2015   Diarrhea, unspecified 07/22/2015   Pneumonia 05/21/2015   Hypoglycemia 04/24/2015   Unresponsiveness 04/24/2015   Sinus bradycardia 04/24/2015   Hypothermia 04/24/2015   Acute hypoxemic respiratory failure ([place]) 04/24/2015   Acute on chronic diastolic CHF (congestive heart failure) ([place]) 04/05/2015   Diabetic gastroparesis ([place]) 04/05/2015   Hypokalemia 04/05/2015   Generalized weakness 04/05/2015   Acute pulmonary edema ([place]) 04/03/2015   Nausea and vomiting 04/03/2015   Hypoglycemia associated with diabetes ([place]) 04/03/2015   Anemia of chronic disease 04/03/2015   Secondary hyperparathyroidism ([place]) 04/03/2015   Pressure ulcer 04/02/2015   Acute respiratory failure with hypoxia ([place]) 04/01/2015   Adjustment disorder with  anxiety 03/14/2015   Somatic symptom disorder, mild 03/08/2015   Coronary artery disease involving native coronary artery of native heart without angina pectoris    Nausea & vomiting 03/06/2015   Abdominal pain 03/06/2015   Diabetes mellitus, type II ([place]) 03/06/2015   HTN (hypertension) 03/06/2015   Gastroparesis 02/24/2015   Pleural effusion 02/19/2015   Bacteremia due to Enterococcus 02/19/2015   End-stage renal disease on hemodialysis ([place]) 02/19/2015   Diarrhea 08/01/2014   PCP:  Denton Lank, MD Pharmacy:   [id number] [place], [place] [address] [place] [address] Phone: [phone] Fax: [phone]     Social Determinants of Health (SDOH) Interventions    Readmission Risk Interventions Readmission Risk Prevention Plan 12/11/2020 09/21/2020 08/14/2020  Transportation Screening Complete  Complete -  Medication Review Press photographer) Complete Complete -  PCP or Specialist appointment within 3-5 days of discharge Complete Complete Complete  HRI or Home Care Consult Complete Complete Complete  SW Recovery Care/Counseling Consult Complete Complete Complete  Palliative Care Screening Not Applicable Complete Complete  [place] Not Complete Complete Not Applicable  SNF Comments working on placement - -  Some recent data might be hidden

## 2021-05-03 NOTE — Progress Notes (Signed)
PROGRESS NOTE    [name]  RCV:[id number] DOB: 1961-08-11 DOA: 04/25/2021 PCP: Denton Lank, MD   Chief Complaint.  Altered mental status. Brief Narrative:   [name] is a 60 y.o. female with medical history significant for end-stage renal disease on hemodialysis on Monday, Wednesday, Friday schedule, diabetes mellitus complicated by peripheral polyneuropathy and gastroparesis,, hyperlipidemia, anemia of chronic kidney disease with baseline hemoglobin 8-10, hypertension, who is admitted to [facility] on 04/25/2021 with severe sepsis after presenting from home to [facility] ED complaining of fever.  Patient was diagnosed with septic shock, blood culture positive for Acinetobacter calcoaceticus.  This appears to be secondary to dialysis catheter infection.  Patient also developed acute hypoxemic respiratory failure required mechanical ventilation. Permacath was removed on 8/06.  Patient treated with Unasyn, last dose 8/20. Transfer out of ICU on 8/9.   Assessment & Plan:   Principal Problem:   Severe sepsis with septic shock (HCC) Active Problems:   Diabetes mellitus, type II ([place])   Hypoglycemia associated with diabetes ([place])   Generalized weakness   Acute hypoxemic respiratory failure (HCC)   Pressure ulcer, stage 2 (HCC)   ESRD (end stage renal disease) (HCC)   GERD (gastroesophageal reflux disease)   Acute metabolic encephalopathy   HCAP (healthcare-associated pneumonia)  #1.  Septic shock with Acinetobacter septicemia secondary to dialysis catheter infection. Acute hypoxemic respite failure secondary to septic shock. Permacath infection. Last dose of Unasyn 8/20.  #2.  Dysphagia. Spoke with speech therapist, condition had improved.  4.  End-stage renal disease. Hypophosphatemia. Hyponatremia. Permacath placement is tentatively scheduled on Monday.  Patient currently using a temporary dialysis catheter.  Goal of care discussion: Discussed with palliative  care, patient is appropriate for inpatient hospice.  Had a long discussion with patient son late yesterday evening.  He also talked to his mom, at this point, they still want permacath replaced and the patient be dialyzed.  However, they will continue discussion on Saturday.  In my opinion, patient condition is terminal.  DVT prophylaxis: Heparin Code Status: DNR Family Communication:  Disposition Plan:    Status is: Inpatient  Remains inpatient appropriate because:Inpatient level of care appropriate due to severity of illness  Dispo: The patient is from: Home              Anticipated d/c is to: Home              Patient currently is not medically stable to d/c.   Difficult to place patient No        No intake/output data recorded. No intake/output data recorded.     Consultants:  Nephrology  Procedures: None  Antimicrobials: None  Subjective: Patient is severely hearing impaired.  She does not have signal short of breath, she has no cough. She has fair appetite, no nausea vomiting or diarrhea. No fever chills No dysuria or hematuria No chest pain or palpitation.   Objective: Vitals:   05/02/21 1531 05/02/21 2000 05/03/21 0536 05/03/21 0820  BP: (!) 163/75 (!) 157/64 (!) 163/70 (!) 153/63  Pulse: 86 80 83 79  Resp: 20 16  16   Temp: 99.1 F (37.3 C) 98.2 F (36.8 C) 98.9 F (37.2 C) 99.2 F (37.3 C)  TempSrc:  Oral  Oral  SpO2: 100% 100% 100% 100%  Weight:      Height:       No intake or output data in the 24 hours ending 05/03/21 [place]   04/29/21 0454  04/30/21 0500 05/01/21 0500  Weight: 83.1 kg 80.6 kg 75.5 kg    Examination:  General exam: Appears calm and comfortable, ill appearing Respiratory system: Clear to auscultation. Respiratory effort normal. Cardiovascular system: S1 & S2 heard, RRR. No JVD, murmurs, rubs, gallops or clicks. No pedal edema. Gastrointestinal system: Abdomen is nondistended, soft and nontender. No  organomegaly or masses felt. Normal bowel sounds heard. Central nervous system: Alert and oriented x2.  No focal neurological deficits. Extremities: Symmetric 5 x 5 power. Skin: No rashes, lesions or ulcers Psychiatry: Mood & affect appropriate.     Data Reviewed: I have personally reviewed following labs and imaging studies  CBC: Recent Labs  Lab 04/27/21 0417 04/28/21 0447 04/28/21 1718 04/29/21 0411 04/29/21 1318 04/30/21 0512 05/01/21 0433  WBC 11.3* 10.2  --  6.2  --  7.0 6.0  HGB 7.7* 7.0* 7.5* 7.1* 8.6* 9.2* 9.8*  HCT 24.1* 21.4* 23.7* 21.9* 26.6* 29.2* 30.7*  MCV 88.3 87.3  --  90.1  --  88.5 86.2  PLT 149* 140*  --  115*  --  107* 027*   Basic Metabolic Panel: Recent Labs  Lab 04/26/21 1932 04/27/21 0417 04/28/21 0447 04/28/21 1718 04/29/21 0411 04/30/21 0512 05/01/21 0433 05/02/21 0540  NA  --    < > 133* 133* 132* 133* 134* 135  K  --    < > 2.8* 3.1* 3.1* 3.8 3.6 3.9  CL  --    < > 101 101 101 99 97* 98  CO2  --    < > 27 27 28 26 27  32  GLUCOSE  --    < > 114* 155* 145* 100* 193* 161*  BUN  --    < > 6* 6* 6* 10 13 14   CREATININE  --    < > 1.27* 1.15* 1.10* 1.59* 1.87* 1.80*  CALCIUM  --    < > 8.5* 8.8* 8.3* 8.2* 8.3* 8.1*  MG 1.6*  --  1.7  --  1.9  --  2.3 2.1  PHOS  --    < > 1.5* 2.1* 2.0* 2.2* 1.8* 1.9*   < > = values in this interval not displayed.   GFR: Estimated Creatinine Clearance: 33.4 mL/min (A) (by C-G formula based on SCr of 1.8 mg/dL (H)). Liver Function Tests: Recent Labs  Lab 04/28/21 1718 04/29/21 0411 04/30/21 0512 05/01/21 0433 05/02/21 0540  AST  --  16  --   --   --   ALT  --  13  --   --   --   ALKPHOS  --  140*  --   --   --   BILITOT  --  0.6  --   --   --   PROT  --  6.0*  --   --   --   ALBUMIN 2.9* 2.9*  2.6* 2.4* 2.6* 2.6*   No results for input(s): LIPASE, AMYLASE in the last 168 hours. No results for input(s): AMMONIA in the last 168 hours. Coagulation Profile: No results for input(s): INR, PROTIME in  the last 168 hours. Cardiac Enzymes: No results for input(s): CKTOTAL, CKMB, CKMBINDEX, TROPONINI in the last 168 hours. BNP (last 3 results) No results for input(s): PROBNP in the last 8760 hours. HbA1C: No results for input(s): HGBA1C in the last 72 hours. CBG: Recent Labs  Lab 05/02/21 1959 05/03/21 0536 05/03/21 0655 05/03/21 0819 05/03/21 1155  GLUCAP 146* 115* 108* 93 114*   Lipid Profile: No  results for input(s): CHOL, HDL, LDLCALC, TRIG, CHOLHDL, LDLDIRECT in the last 72 hours. Thyroid Function Tests: No results for input(s): TSH, T4TOTAL, FREET4, T3FREE, THYROIDAB in the last 72 hours. Anemia Panel: No results for input(s): VITAMINB12, FOLATE, FERRITIN, TIBC, IRON, RETICCTPCT in the last 72 hours. Sepsis Labs: Recent Labs  Lab 04/27/21 0417  PROCALCITON 13.35    Recent Results (from the past 240 hour(s))  Culture, blood (Routine x 2)     Status: Abnormal   Collection Time: 04/25/21  4:51 PM   Specimen: BLOOD  Result Value Ref Range Status   Specimen Description   Final    BLOOD LW Performed at [facility], [address] [place], [place] [address]    Special Requests   Final    BOTTLES DRAWN AEROBIC AND ANAEROBIC Blood Culture adequate volume Performed at [facility], [address] [place], [place] [address]    Culture  Setup Time   Final    GRAM VARIABLE COCCOBACILLI AEROBIC BOTTLE ONLY Organism ID to follow CRITICAL RESULT CALLED TO, READ BACK BY AND VERIFIED WITH: [facility] AT 1014 04/26/21.PMF Performed at [facility], [address] [place], [place] [address]    Culture ACINETOBACTER CALCOACETICUS/BAUMANNII COMPLEX (A)  Final   Report Status 04/28/2021 FINAL  Final   Organism ID, Bacteria ACINETOBACTER CALCOACETICUS/BAUMANNII COMPLEX  Final      Susceptibility   Acinetobacter calcoaceticus/baumannii complex - MIC*    CEFTAZIDIME 16 INTERMEDIATE Intermediate     CIPROFLOXACIN <=0.25 SENSITIVE Sensitive      GENTAMICIN <=1 SENSITIVE Sensitive     IMIPENEM <=0.25 SENSITIVE Sensitive     PIP/TAZO 32 INTERMEDIATE Intermediate     TRIMETH/SULFA <=20 SENSITIVE Sensitive     AMPICILLIN/SULBACTAM <=2 SENSITIVE Sensitive     * ACINETOBACTER CALCOACETICUS/BAUMANNII COMPLEX  Blood Culture ID Panel (Reflexed)     Status: Abnormal   Collection Time: 04/25/21  4:51 PM  Result Value Ref Range Status   Enterococcus faecalis NOT DETECTED NOT DETECTED Final   Enterococcus Faecium NOT DETECTED NOT DETECTED Final   Listeria monocytogenes NOT DETECTED NOT DETECTED Final   Staphylococcus species NOT DETECTED NOT DETECTED Final   Staphylococcus aureus (BCID) NOT DETECTED NOT DETECTED Final   Staphylococcus epidermidis NOT DETECTED NOT DETECTED Final   Staphylococcus lugdunensis NOT DETECTED NOT DETECTED Final   Streptococcus species NOT DETECTED NOT DETECTED Final   Streptococcus agalactiae NOT DETECTED NOT DETECTED Final   Streptococcus pneumoniae NOT DETECTED NOT DETECTED Final   Streptococcus pyogenes NOT DETECTED NOT DETECTED Final   A.calcoaceticus-baumannii DETECTED (A) NOT DETECTED Final    Comment: CRITICAL RESULT CALLED TO, READ BACK BY AND VERIFIED WITH: CHARLES SHANLEVER AT 1014 04/26/21.PMF    Bacteroides fragilis NOT DETECTED NOT DETECTED Final   Enterobacterales NOT DETECTED NOT DETECTED Final   Enterobacter cloacae complex NOT DETECTED NOT DETECTED Final   Escherichia coli NOT DETECTED NOT DETECTED Final   Klebsiella aerogenes NOT DETECTED NOT DETECTED Final   Klebsiella oxytoca NOT DETECTED NOT DETECTED Final   Klebsiella pneumoniae NOT DETECTED NOT DETECTED Final   Proteus species NOT DETECTED NOT DETECTED Final   Salmonella species NOT DETECTED NOT DETECTED Final   Serratia marcescens NOT DETECTED NOT DETECTED Final   Haemophilus influenzae NOT DETECTED NOT DETECTED Final   Neisseria meningitidis NOT DETECTED NOT DETECTED Final   Pseudomonas aeruginosa NOT DETECTED NOT DETECTED Final    Stenotrophomonas maltophilia NOT DETECTED NOT DETECTED Final   Candida albicans NOT DETECTED NOT DETECTED Final  Candida auris NOT DETECTED NOT DETECTED Final   Candida glabrata NOT DETECTED NOT DETECTED Final   Candida krusei NOT DETECTED NOT DETECTED Final   Candida parapsilosis NOT DETECTED NOT DETECTED Final   Candida tropicalis NOT DETECTED NOT DETECTED Final   Cryptococcus neoformans/gattii NOT DETECTED NOT DETECTED Final   CTX-M ESBL NOT DETECTED NOT DETECTED Final   Carbapenem resistance IMP NOT DETECTED NOT DETECTED Final   Carbapenem resistance KPC NOT DETECTED NOT DETECTED Final   Carbapenem resistance NDM NOT DETECTED NOT DETECTED Final   Carbapenem resistance VIM NOT DETECTED NOT DETECTED Final    Comment: Performed at [facility], [address] [place], [place] [address]  Culture, blood (Routine x 2)     Status: Abnormal   Collection Time: 04/25/21  6:38 PM   Specimen: BLOOD  Result Value Ref Range Status   Specimen Description   Final    BLOOD BLOOD LEFT WRIST Performed at [facility], [address], [place], [place] [address]    Special Requests   Final    BOTTLES DRAWN AEROBIC AND ANAEROBIC Blood Culture adequate volume Performed at [facility], [address] [place], [place] [address]    Culture  Setup Time   Final    GRAM VARIABLE COCCOBACILLI AEROBIC BOTTLE ONLY CRITICAL VALUE NOTED.  VALUE IS CONSISTENT WITH PREVIOUSLY REPORTED AND CALLED VALUE. Performed at [facility], [address] [place], [place] [address]    Culture (A)  Final    ACINETOBACTER CALCOACETICUS/BAUMANNII COMPLEX SUSCEPTIBILITIES PERFORMED ON PREVIOUS CULTURE WITHIN THE LAST 5 DAYS. Performed at [facility], [place] [address] [place], [place] [address]    Report Status 04/28/2021 FINAL  Final  Resp Panel by RT-PCR (Flu A&B, Covid) Nasopharyngeal Swab     Status: None   Collection Time: 04/25/21  6:54 PM   Specimen: Nasopharyngeal  Swab; Nasopharyngeal(NP) swabs in vial transport medium  Result Value Ref Range Status   SARS Coronavirus 2 by RT PCR NEGATIVE NEGATIVE Final    Comment: (NOTE) SARS-CoV-2 target nucleic acids are NOT DETECTED.  The SARS-CoV-2 RNA is generally detectable in upper respiratory specimens during the acute phase of infection. The lowest concentration of SARS-CoV-2 viral copies this assay can detect is 138 copies/mL. A negative result does not preclude SARS-Cov-2 infection and should not be used as the sole basis for treatment or other patient management decisions. A negative result may occur with  improper specimen collection/handling, submission of specimen other than nasopharyngeal swab, presence of viral mutation(s) within the areas targeted by this assay, and inadequate number of viral copies(<138 copies/mL). A negative result must be combined with clinical observations, patient history, and epidemiological information. The expected result is Negative.  Fact Sheet for Patients:  EntrepreneurPulse.com.au  Fact Sheet for Healthcare Providers:  IncredibleEmployment.be  This test is no t yet approved or cleared by the [place] FDA and  has been authorized for detection and/or diagnosis of SARS-CoV-2 by FDA under an Emergency Use Authorization (EUA). This EUA will remain  in effect (meaning this test can be used) for the duration of the COVID-19 declaration under Section 564(b)(1) of the Act, 21 U.S.C.section 360bbb-3(b)(1), unless the authorization is terminated  or revoked sooner.       Influenza A by PCR NEGATIVE NEGATIVE Final   Influenza B by PCR NEGATIVE NEGATIVE Final    Comment: (NOTE) The Xpert Xpress SARS-CoV-2/FLU/RSV plus assay is intended as an aid in the diagnosis of influenza from Nasopharyngeal  swab specimens and should not be used as a sole basis for treatment. Nasal washings and aspirates are unacceptable for Xpert Xpress  SARS-CoV-2/FLU/RSV testing.  Fact Sheet for Patients: EntrepreneurPulse.com.au  Fact Sheet for Healthcare Providers: IncredibleEmployment.be  This test is not yet approved or cleared by the [place] FDA and has been authorized for detection and/or diagnosis of SARS-CoV-2 by FDA under an Emergency Use Authorization (EUA). This EUA will remain in effect (meaning this test can be used) for the duration of the COVID-19 declaration under Section 564(b)(1) of the Act, 21 U.S.C. section 360bbb-3(b)(1), unless the authorization is terminated or revoked.  Performed at [facility], [address] [place], [place] [address]   MRSA Next Gen by PCR, Nasal     Status: None   Collection Time: 04/26/21  6:35 PM   Specimen: Nasal Mucosa; Nasal Swab  Result Value Ref Range Status   MRSA by PCR Next Gen NOT DETECTED NOT DETECTED Final    Comment: (NOTE) The GeneXpert MRSA Assay (FDA approved for NASAL specimens only), is one component of a comprehensive MRSA colonization surveillance program. It is not intended to diagnose MRSA infection nor to guide or monitor treatment for MRSA infections. Test performance is not FDA approved in patients less than 59 years old. Performed at [facility], [address] [place], [place] [address]   Cath Tip Culture     Status: None   Collection Time: 04/27/21 11:59 AM   Specimen: Catheter Tip; Other  Result Value Ref Range Status   Specimen Description   Final    CATH TIP Performed at [facility], [address], [place], [place] [address]    Special Requests   Final    NONE Performed at [facility], [address] [place], [place] [address]    Culture   Final    NO GROWTH Performed at [facility], [place] [address], [place], [place] [address]    Report Status 05/01/2021 FINAL  Final  CULTURE, BLOOD (ROUTINE X 2) w Reflex to ID Panel     Status: None    Collection Time: 04/28/21 11:34 AM   Specimen: BLOOD  Result Value Ref Range Status   Specimen Description BLOOD RIGHT AC  Final   Special Requests   Final    BOTTLES DRAWN AEROBIC ONLY Blood Culture results may not be optimal due to an inadequate volume of blood received in culture bottles   Culture   Final    NO GROWTH 5 DAYS Performed at [facility], [address] [place], [place] [address]    Report Status 05/03/2021 FINAL  Final  CULTURE, BLOOD (ROUTINE X 2) w Reflex to ID Panel     Status: None   Collection Time: 04/28/21  5:27 PM   Specimen: BLOOD  Result Value Ref Range Status   Specimen Description BLOOD LT FINGER IN HAND  Final   Special Requests BOTTLES DRAWN AEROBIC ONLY  BCAV  Final   Culture   Final    NO GROWTH 5 DAYS Performed at [facility], [address], [place],  [address]    Report Status 05/03/2021 FINAL  Final         Radiology Studies: No results found.      Scheduled Meds:  vitamin C  500 mg Oral BID   atorvastatin  20 mg Per Tube QPM   chlorhexidine  15 mL Mouth Rinse BID   Chlorhexidine Gluconate Cloth  6  each Topical Q0600   cholecalciferol  1,000 Units Per Tube Daily   feeding supplement (NEPRO CARB STEADY)  237 mL Oral BID BM   fentaNYL  1 patch Transdermal Q72H   heparin  5,000 Units Subcutaneous Q8H   lipase/protease/amylase  36,000 Units Oral TID AC   mouth rinse  15 mL Mouth Rinse q12n4p   midodrine  10 mg Per Tube TID WC   mirtazapine  7.5 mg Per Tube QHS   multivitamin  1 tablet Per Tube QHS   nutrition supplement (JUVEN)  1 packet Per Tube BID BM   QUEtiapine  25 mg Per Tube BID   silver sulfADIAZINE   Topical BID   cyanocobalamin  1,000 mcg Per Tube Daily   Continuous Infusions:  ampicillin-sulbactam (UNASYN) IV 3 g (05/03/21 0836)     LOS: 8 days    Time spent: 32 minutes, more than 50% time involved in direct patient care.    Sharen Hones, MD [facility]   To contact the  attending provider between 7A-7P or the covering provider during after hours 7P-7A, please log into the web site [email or web address] and access using universal [place] password for that web site. If you do not have the password, please call the hospital operator.  05/03/2021, 12:58 PM

## 2021-05-03 NOTE — Evaluation (Signed)
Occupational Therapy Evaluation Patient Details Name: [name] MRN: [id number] DOB: [birth year] Today's Date: 05/03/2021    History of Present Illness Pt is a 60 y/o F with PMH: ESRD on HD MWF, DM complicated by peripheral polyneuropathy and gastroparesis, chronic dHF, CAD, and PVD who presented to [facility] d/t fever. Pt adm for septic shock. This appears to be secondary to dialysis catheter infection.  Patient also developed acute hypoxemic respiratory failure required mechanical ventilation. Extub 8/7, out of ICU 8/9.   Clinical Impression   Pt seen for OT evaluation this date in setting of acute hospitalization d/t sepsis. Pt reports being MOD I for fxl mobility at baseline and INDEP with basic self care except she sponge bathes. Pt requires family assist and transport assist for MD appts and groceries. She states she was previously able to perform IADLs herself, but endorses decline over the last ~1 month. Pt with decreased fxl activity tolerance, strength and balance noted on assessment this date. Pt requiring: MIN A for seated UB ADLs, MAX to TOTAL A for LB ADLs. MAX A +2 for ADL transfers with RW from elevated surface. Will continue to follow acutely. Anticipate pt will require STR should her goal continue to be to become mobile. However, did note in chart that pt and gamily to have [place] discussion. Will continue to follow guidance of team and honor family/patient wishes with regards to therapy services.     Follow Up Recommendations  SNF    Equipment Recommendations  Other (comment) (defer to next level of care)    Recommendations for Other Services       Precautions / Restrictions Precautions Precautions: Fall Restrictions Weight Bearing Restrictions: No      Mobility Bed Mobility Overal bed mobility: Needs Assistance Bed Mobility: Supine to Sit     Supine to sit: Max assist;HOB elevated     General bed mobility comments: increased time, effort, cues to use bed rails.     Transfers Overall transfer level: Needs assistance Equipment used: Rolling walker (2 wheeled) Transfers: Sit to/from Stand Sit to Stand: Max assist;+2 physical assistance;From elevated surface         General transfer comment: increased time/effort    Balance Overall balance assessment: Needs assistance Sitting-balance support: Feet supported;Single extremity supported Sitting balance-Leahy Scale: Fair Sitting balance - Comments: uses UE support, but can sustain static sitting w/o     Standing balance-Leahy Scale: Zero Standing balance comment: At least MOD A +2 to sustain static stand with RW for B UE Support.                           ADL either performed or assessed with clinical judgement   ADL Overall ADL's : Needs assistance/impaired                                       General ADL Comments: MIN A for seated UB ADLs, MAX to TOTAL A for LB ADLs. MAX A +2 for ADL transfers with RW from elevated surface.     Vision Patient Visual Report: No change from baseline       Perception     Praxis      Pertinent Vitals/Pain Pain Assessment: Faces Faces Pain Scale: Hurts little more Pain Location: feet Pain Descriptors / Indicators: Burning Pain Intervention(s): Limited activity within patient's tolerance;Monitored during session  Hand Dominance Left   Extremity/Trunk Assessment Upper Extremity Assessment Upper Extremity Assessment: Generalized weakness (limited shld ROM. Grip MMT grossly 3+/5)   Lower Extremity Assessment Lower Extremity Assessment: Generalized weakness       Communication Communication Communication: No difficulties   Cognition Arousal/Alertness: Awake/alert Behavior During Therapy: WFL for tasks assessed/performed Overall Cognitive Status: Difficult to assess                                 General Comments: follows most basic commands when she can hear. She is oriented to self and some  aspects of time (year) and month with cues, but not date/day and not fully oriented to reason for hospitalization. Some delayed responses, seemingly r/t HOH.   General Comments       Exercises Other Exercises Other Exercises: OT ed re: importance of OOB activity should pt have a goal of continuing to be mobile (pt to discuss with family, medical GOC)   Shoulder Instructions      Home Living Family/patient expects to be discharged to:: Private residence Living Arrangements: Alone Available Help at Discharge: Family;Available PRN/intermittently Type of Home: Apartment Home Access: Level entry     Home Layout: One level     Bathroom Shower/Tub: Tub/shower unit         Home Equipment: Walker - 4 wheels;Cane - single point;Cane - quad;Wheelchair - manual;Toilet riser;Walker - 2 wheels;Other (comment) (lift chair)   Additional Comments: Pt's son visits daily. Neighbor checks in frequently. Uses van service to get to HD.      Prior Functioning/Environment Level of Independence: Needs assistance  Gait / Transfers Assistance Needed: alternates use of cane or walker(s) for fxl mobility, states it depends on the day. ADL's / Homemaking Assistance Needed: States she was INDEP with ADLs, sponge bathes. Able to cook, clean and do laundry. Gets assist for transportation and family assist for groceries.   Comments: sleeps in lift chair        OT Problem List: Decreased strength;Decreased activity tolerance;Impaired balance (sitting and/or standing);Decreased cognition      OT Treatment/Interventions: Self-care/ADL training;DME and/or AE instruction;Therapeutic exercise;Balance training;Therapeutic activities;Energy conservation;Patient/family education    OT Goals(Current goals can be found in the care plan section) Acute Rehab OT Goals Patient Stated Goal: to be able to get up and get some weight through my feet. OT Goal Formulation: With patient Time For Goal Achievement:  05/17/21 Potential to Achieve Goals: Good ADL Goals Pt Will Perform Grooming: with set-up;sitting Pt Will Transfer to Toilet: with mod assist;with max assist;with +2 assist;stand pivot transfer;bedside commode Pt Will Perform Toileting - Clothing Manipulation and hygiene: with mod assist;with max assist;with 2+ total assist;sit to/from stand Pt/caregiver will Perform Home Exercise Program: Increased strength;Both right and left upper extremity;With minimal assist  OT Frequency: Min 1X/week   Barriers to D/C:            Co-evaluation PT/OT/SLP Co-Evaluation/Treatment: Yes Reason for Co-Treatment: Complexity of the patient's impairments (multi-system involvement);To address functional/ADL transfers;For patient/therapist safety PT goals addressed during session: Mobility/safety with mobility;Balance OT goals addressed during session: ADL's and self-care;Proper use of Adaptive equipment and DME      AM-PAC OT "6 Clicks" Daily Activity     Outcome Measure Help from another person eating meals?: A Little Help from another person taking care of personal grooming?: A Lot Help from another person toileting, which includes using toliet, bedpan, or urinal?: A Lot Help from  another person bathing (including washing, rinsing, drying)?: A Lot Help from another person to put on and taking off regular upper body clothing?: A Lot Help from another person to put on and taking off regular lower body clothing?: Total 6 Click Score: 12   End of Session Equipment Utilized During Treatment: Gait belt;Rolling walker;Oxygen  Activity Tolerance: Patient tolerated treatment well Patient left: Other (comment) (seated EOB with PT and PTS present in room finishing up session.)  OT Visit Diagnosis: Unsteadiness on feet (R26.81);Muscle weakness (generalized) (M62.81)                Time: 4562-5638 OT Time Calculation (min): 37 min Charges:  OT General Charges $OT Visit: 1 Visit OT Evaluation $OT Eval  Moderate Complexity: 1 Mod OT Treatments $Self Care/Home Management : 8-22 mins  Gerrianne Scale, MS, OTR/L ascom [phone] 05/03/21, 10:42 AM

## 2021-05-03 NOTE — Progress Notes (Signed)
ID Patient was in the dialysis. She is awake and alert Says she is feeling better On examination BP (!) 160/59 (BP Location: Right Arm)   Pulse 80   Temp 99.9 F (37.7 C) (Oral)   Resp 16   Ht 5\' 5"  (1.651 m)   Wt 75.5 kg   SpO2 100%   BMI 27.70 kg/m   Awake Chronically ill Pale Chest bilateral air entry Heart sound S1-S2 Abdomen slightly distended with abdominal wall edema Legs are swollen with chronic lymphedematous changes and oozing. Right femoral hemodialysis catheter.  Labs CBC Latest Ref Rng & Units 05/01/2021 04/30/2021 04/29/2021  WBC 4.0 - 10.5 K/uL 6.0 7.0 -  Hemoglobin 12.0 - 15.0 g/dL 9.8(L) 9.2(L) 8.6(L)  Hematocrit 36.0 - 46.0 % 30.7(L) 29.2(L) 26.6(L)  Platelets 150 - 400 K/uL 123(L) 107(L) -    CMP Latest Ref Rng & Units 05/02/2021 05/01/2021 04/30/2021  Glucose 70 - 99 mg/dL 161(H) 193(H) 100(H)  BUN 8 - 23 mg/dL 14 13 10   Creatinine 0.44 - 1.00 mg/dL 1.80(H) 1.87(H) 1.59(H)  Sodium 135 - 145 mmol/L 135 134(L) 133(L)  Potassium 3.5 - 5.1 mmol/L 3.9 3.6 3.8  Chloride 98 - 111 mmol/L 98 97(L) 99  CO2 22 - 32 mmol/L 32 27 26  Calcium 8.9 - 10.3 mg/dL 8.1(L) 8.3(L) 8.2(L)  Total Protein 6.5 - 8.1 g/dL - - -  Total Bilirubin 0.3 - 1.2 mg/dL - - -  Alkaline Phos 38 - 126 U/L - - -  AST 15 - 41 U/L - - -  ALT 0 - 44 U/L - - -     Micro 04/25/2021 blood culture Acinetobacter 04/28/2021 blood culture no growth  Impression/recommendation  Acinetobacter bacteremia.  Source was the dialysis catheter even though the tip was negative.  Patient is currently on Unasyn.  The end date for antibiotics is 05/11/2021.  If she had to be discharged before that then the rest of the treatment could be done with oral ciprofloxacin adjusted to creatinine clearance.  Consult pharmacist for the dosing of ciprofloxacin if needed.  Sepsis with hypotension has resolved  Encephalopathy has resolved  End-stage renal disease on hemodialysis  Is anasarca improving CAD/congestive heart  failure  Hypoalbuminemia, poor nutrition  Anemia of kidney disease  Discussed the management with the care team  ID will follow her peripherally this weekend call if needed.

## 2021-05-03 NOTE — Progress Notes (Signed)
Speech Language Pathology Treatment: Dysphagia  Patient Details Name: [name] MRN: [id number] DOB: 01-Apr-1961 Today's Date: 05/03/2021 Time: 9030-0923 SLP Time Calculation (min) (ACUTE ONLY): 35 min  Assessment / Plan / Recommendation Clinical Impression  Pt seen for ongoing assessment of swallowing. She is alert, verbally responsive and engaged in conversation w/ SLP; min HOH. Pt is on [place] O2 4L; wbc wnl. Dentures in place. Pt explained general aspiration precautions and agreed verbally to the need for following them especially sitting upright for all oral intake -- supported behind the back more for full upright sitting. Pt assisted w/ positioning d/t min weakness. She fed herself soft solids and thin liquids via cup/straw. No overt clinical s/s of aspiration were noted w/ any consistency; respiratory status remained calm and unlabored, vocal quality clear b/t trials. Recommend offering pills in puree as it provides cohesion for swallowing tablets. Oral phase appeared grossly [facility] for bolus management and timely A-P transfer for swallowing; oral clearing achieved w/ all consistencies. NSG denied any deficits in swallowing as well.   Pt appears at reduced risk for aspriation when following general aspiration precautions. Recommend a Regular/mech soft diet for ease of soft meats/foods w/ gravies added to moisten foods; Thin liquids. Recommend general aspiration precautions; Pills Whole in Puree if needed; tray setup and positioning assistance for meals. ST services will sign off at this time w/ MD to reconsult if needed while admitted. NSG updated. Precautions posted at bedside.      HPI HPI: Pt  is a 61 y.o. female with medical history significant for end-stage renal disease on hemodialysis on Monday, Wednesday, Friday schedule, diabetes mellitus complicated by peripheral polyneuropathy and gastroparesis,, hyperlipidemia, anemia of chronic kidney disease with baseline hemoglobin 8-10,  hypertension, HIatal Hernia who is admitted to [facility] on 04/25/2021 with severe sepsis after presenting from home. Provision of history is limited by the patient's somnolence.  The patient reports 2 days of subjective fever, with temperature max at home over that time of 103 F.  She notes associated crampy abdominal discomfort in the bilateral lower abdominal quadrants, which she states is similar in distribution, quality, and intensity relative to the abdominal discomfort that she was experiencing at the time of hospitalization to [facility] ED approximately 1 month ago at which time she was diagnosed with ESBL Ecoli urinary tract infection.  Patient presented with altered mental status, fever and severe sepsis with septic shock.  Patient was intubated upon admission.  She had Acinetobacter bacteremia and infected permacath.  Pt was orally intubated/extubated on 04/28/2021.      SLP Plan  All goals met       Recommendations  Diet recommendations: Regular;Thin liquid (meats cut; moistened) Liquids provided via: Cup;Straw Medication Administration: Whole meds with puree (as needed vs w/ liquids) Supervision: Patient able to self feed (tray setup) Compensations: Minimize environmental distractions;Slow rate;Small sips/bites;Follow solids with liquid;Lingual sweep for clearance of pocketing Postural Changes and/or Swallow Maneuvers: Out of bed for meals;Seated upright 90 degrees;Upright 30-60 min after meal                General recommendations:  (Dietician f/u) Oral Care Recommendations: Oral care BID;Oral care before and after PO;Patient independent with oral care;Staff/trained caregiver to provide oral care ([place] care support) Follow up Recommendations: Skilled Nursing facility (TBD by PT) SLP Visit Diagnosis: Dysphagia, unspecified (R13.10) Plan: All goals met       GO  [name], MS, CCC-SLP Speech Language Pathologist Rehab  Services [phone] [facility] 05/03/2021, 10:18 AM

## 2021-05-03 NOTE — Progress Notes (Addendum)
[facility]  ROUNDING NOTE   Subjective:   [name] was admitted to [facility] on 04/25/2021 for Sepsis [facility]) [A41.9] Severe sepsis ([place]) [A41.9, R65.20] Fever in other diseases [R50.81]  Extubated 04/28/2021 Patient seen sitting up in bed Eating breakfast Voicing frustration with family not wanting to honor her health care wishes. States she is tired and been through a lot. States she would be great if she had a full healthcare team to take home with her. Says her son has told her in the past that he couldn't do any since he helps take care of her. Her grandchild is scared to come near her with her arm being swollen and scars from dialysis and procedures. She says she knows this will only get worse. She states she does not want any further procedures.   Patient seen later during dialysis   HEMODIALYSIS FLOWSHEET:  Blood Flow Rate (mL/min): 300 mL/min Arterial Pressure (mmHg): -90 mmHg Venous Pressure (mmHg): 90 mmHg Transmembrane Pressure (mmHg): 60 mmHg Ultrafiltration Rate (mL/min): 1010 mL/min Dialysate Flow Rate (mL/min): 500 ml/min Conductivity: Machine : 13.6 Conductivity: Machine : 13.6 Dialysis Fluid Bolus: Normal Saline Bolus Amount (mL): 300 mL    Objective:  Vital signs in last 24 hours:  Temp:  [98.2 F (36.8 C)-99.2 F (37.3 C)] 98.9 F (37.2 C) (08/12 1428) Pulse Rate:  [79-87] 80 (08/12 1505) Resp:  [16-21] 20 (08/12 1505) BP: (153-173)/(63-79) 164/68 (08/12 1505) SpO2:  [100 %] 100 % (08/12 1505)  Weight change:  Filed Weights   04/29/21 0454 04/30/21 0500 05/01/21 0500  Weight: 83.1 kg 80.6 kg 75.5 kg    Intake/Output: No intake/output data recorded.   Intake/Output this shift:  No intake/output data recorded.  Physical Exam: General: ill appearing, frail  Head: Normocephalic, atraumatic. Moist oral mucosal membranes  Lungs:  Coarse breath sounds, normal breathing effort  Heart: Regular rate and rhythm  Abdomen:  Soft, nontender,    Extremities: ++ peripheral edema.  Neurologic: Able to follow commands and answer simple questions  Skin: No lesions  Access: Right femoral temporary dialysis catheter    Basic Metabolic Panel: Recent Labs  Lab 04/26/21 1932 04/27/21 0417 04/28/21 0447 04/28/21 1718 04/29/21 0411 04/30/21 0512 05/01/21 0433 05/02/21 0540  NA  --    < > 133* 133* 132* 133* 134* 135  K  --    < > 2.8* 3.1* 3.1* 3.8 3.6 3.9  CL  --    < > 101 101 101 99 97* 98  CO2  --    < > 27 27 28 26 27  32  GLUCOSE  --    < > 114* 155* 145* 100* 193* 161*  BUN  --    < > 6* 6* 6* 10 13 14   CREATININE  --    < > 1.27* 1.15* 1.10* 1.59* 1.87* 1.80*  CALCIUM  --    < > 8.5* 8.8* 8.3* 8.2* 8.3* 8.1*  MG 1.6*  --  1.7  --  1.9  --  2.3 2.1  PHOS  --    < > 1.5* 2.1* 2.0* 2.2* 1.8* 1.9*   < > = values in this interval not displayed.     Liver Function Tests: Recent Labs  Lab 04/28/21 1718 04/29/21 0411 04/30/21 0512 05/01/21 0433 05/02/21 0540  AST  --  16  --   --   --   ALT  --  13  --   --   --  ALKPHOS  --  140*  --   --   --   BILITOT  --  0.6  --   --   --   PROT  --  6.0*  --   --   --   ALBUMIN 2.9* 2.9*  2.6* 2.4* 2.6* 2.6*    No results for input(s): LIPASE, AMYLASE in the last 168 hours.  No results for input(s): AMMONIA in the last 168 hours.   CBC: Recent Labs  Lab 04/27/21 0417 04/28/21 0447 04/28/21 1718 04/29/21 0411 04/29/21 1318 04/30/21 0512 05/01/21 0433  WBC 11.3* 10.2  --  6.2  --  7.0 6.0  HGB 7.7* 7.0* 7.5* 7.1* 8.6* 9.2* 9.8*  HCT 24.1* 21.4* 23.7* 21.9* 26.6* 29.2* 30.7*  MCV 88.3 87.3  --  90.1  --  88.5 86.2  PLT 149* 140*  --  115*  --  107* 123*     Cardiac Enzymes: No results for input(s): CKTOTAL, CKMB, CKMBINDEX, TROPONINI in the last 168 hours.  BNP: Invalid input(s): POCBNP  CBG: Recent Labs  Lab 05/02/21 1959 05/03/21 0536 05/03/21 0655 05/03/21 0819 05/03/21 1155  GLUCAP 146* 115* 108* 3 114*     Microbiology: Results for  orders placed or performed during the hospital encounter of 04/25/21  Culture, blood (Routine x 2)     Status: Abnormal   Collection Time: 04/25/21  4:51 PM   Specimen: BLOOD  Result Value Ref Range Status   Specimen Description   Final    BLOOD LW Performed at [facility], [address], [place], [place] [address]    Special Requests   Final    BOTTLES DRAWN AEROBIC AND ANAEROBIC Blood Culture adequate volume Performed at [facility], [address] [place], [place] [address]    Culture  Setup Time   Final    GRAM VARIABLE COCCOBACILLI AEROBIC BOTTLE ONLY Organism ID to follow CRITICAL RESULT CALLED TO, READ BACK BY AND VERIFIED WITH: [facility] AT 2297 04/26/21.PMF Performed at [facility], [address] [place], [place] [address]    Culture ACINETOBACTER CALCOACETICUS/BAUMANNII COMPLEX (A)  Final   Report Status 04/28/2021 FINAL  Final   Organism ID, Bacteria ACINETOBACTER CALCOACETICUS/BAUMANNII COMPLEX  Final      Susceptibility   Acinetobacter calcoaceticus/baumannii complex - MIC*    CEFTAZIDIME 16 INTERMEDIATE Intermediate     CIPROFLOXACIN <=0.25 SENSITIVE Sensitive     GENTAMICIN <=1 SENSITIVE Sensitive     IMIPENEM <=0.25 SENSITIVE Sensitive     PIP/TAZO 32 INTERMEDIATE Intermediate     TRIMETH/SULFA <=20 SENSITIVE Sensitive     AMPICILLIN/SULBACTAM <=2 SENSITIVE Sensitive     * ACINETOBACTER CALCOACETICUS/BAUMANNII COMPLEX  Blood Culture ID Panel (Reflexed)     Status: Abnormal   Collection Time: 04/25/21  4:51 PM  Result Value Ref Range Status   Enterococcus faecalis NOT DETECTED NOT DETECTED Final   Enterococcus Faecium NOT DETECTED NOT DETECTED Final   Listeria monocytogenes NOT DETECTED NOT DETECTED Final   Staphylococcus species NOT DETECTED NOT DETECTED Final   Staphylococcus aureus (BCID) NOT DETECTED NOT DETECTED Final   Staphylococcus epidermidis NOT DETECTED NOT DETECTED Final   Staphylococcus lugdunensis NOT  DETECTED NOT DETECTED Final   Streptococcus species NOT DETECTED NOT DETECTED Final   Streptococcus agalactiae NOT DETECTED NOT DETECTED Final   Streptococcus pneumoniae NOT DETECTED NOT DETECTED Final   Streptococcus pyogenes NOT DETECTED NOT DETECTED Final   A.calcoaceticus-baumannii DETECTED (A) NOT DETECTED Final    Comment:  CRITICAL RESULT CALLED TO, READ BACK BY AND VERIFIED WITH: [facility] AT 1014 04/26/21.PMF    Bacteroides fragilis NOT DETECTED NOT DETECTED Final   Enterobacterales NOT DETECTED NOT DETECTED Final   Enterobacter cloacae complex NOT DETECTED NOT DETECTED Final   Escherichia coli NOT DETECTED NOT DETECTED Final   Klebsiella aerogenes NOT DETECTED NOT DETECTED Final   Klebsiella oxytoca NOT DETECTED NOT DETECTED Final   Klebsiella pneumoniae NOT DETECTED NOT DETECTED Final   Proteus species NOT DETECTED NOT DETECTED Final   Salmonella species NOT DETECTED NOT DETECTED Final   Serratia marcescens NOT DETECTED NOT DETECTED Final   Haemophilus influenzae NOT DETECTED NOT DETECTED Final   Neisseria meningitidis NOT DETECTED NOT DETECTED Final   Pseudomonas aeruginosa NOT DETECTED NOT DETECTED Final   Stenotrophomonas maltophilia NOT DETECTED NOT DETECTED Final   Candida albicans NOT DETECTED NOT DETECTED Final   Candida auris NOT DETECTED NOT DETECTED Final   Candida glabrata NOT DETECTED NOT DETECTED Final   Candida krusei NOT DETECTED NOT DETECTED Final   Candida parapsilosis NOT DETECTED NOT DETECTED Final   Candida tropicalis NOT DETECTED NOT DETECTED Final   Cryptococcus neoformans/gattii NOT DETECTED NOT DETECTED Final   CTX-M ESBL NOT DETECTED NOT DETECTED Final   Carbapenem resistance IMP NOT DETECTED NOT DETECTED Final   Carbapenem resistance KPC NOT DETECTED NOT DETECTED Final   Carbapenem resistance NDM NOT DETECTED NOT DETECTED Final   Carbapenem resistance VIM NOT DETECTED NOT DETECTED Final    Comment: Performed at [facility], [address] [place], [place] [address]  Culture, blood (Routine x 2)     Status: Abnormal   Collection Time: 04/25/21  6:38 PM   Specimen: BLOOD  Result Value Ref Range Status   Specimen Description   Final    BLOOD BLOOD LEFT WRIST Performed at [facility], [address] [place], [place] [address]    Special Requests   Final    BOTTLES DRAWN AEROBIC AND ANAEROBIC Blood Culture adequate volume Performed at [facility], [address] [place], [place] [address]    Culture  Setup Time   Final    GRAM VARIABLE COCCOBACILLI AEROBIC BOTTLE ONLY CRITICAL VALUE NOTED.  VALUE IS CONSISTENT WITH PREVIOUSLY REPORTED AND CALLED VALUE. Performed at [facility], [address] [place], [place] [address]    Culture (A)  Final    ACINETOBACTER CALCOACETICUS/BAUMANNII COMPLEX SUSCEPTIBILITIES PERFORMED ON PREVIOUS CULTURE WITHIN THE LAST 5 DAYS. Performed at [facility], [place] [address], [place], [place] [address]    Report Status 04/28/2021 FINAL  Final  Resp Panel by RT-PCR (Flu A&B, Covid) Nasopharyngeal Swab     Status: None   Collection Time: 04/25/21  6:54 PM   Specimen: Nasopharyngeal Swab; Nasopharyngeal(NP) swabs in vial transport medium  Result Value Ref Range Status   SARS Coronavirus 2 by RT PCR NEGATIVE NEGATIVE Final    Comment: (NOTE) SARS-CoV-2 target nucleic acids are NOT DETECTED.  The SARS-CoV-2 RNA is generally detectable in upper respiratory specimens during the acute phase of infection. The lowest concentration of SARS-CoV-2 viral copies this assay can detect is 138 copies/mL. A negative result does not preclude SARS-Cov-2 infection and should not be used as the sole basis for treatment or other patient management decisions. A negative result may occur with  improper specimen collection/handling, submission of specimen other than nasopharyngeal swab, presence of viral mutation(s) within the areas targeted by this assay, and  inadequate number of  viral copies(<138 copies/mL). A negative result must be combined with clinical observations, patient history, and epidemiological information. The expected result is Negative.  Fact Sheet for Patients:  EntrepreneurPulse.com.au  Fact Sheet for Healthcare Providers:  IncredibleEmployment.be  This test is no t yet approved or cleared by the [place] FDA and  has been authorized for detection and/or diagnosis of SARS-CoV-2 by FDA under an Emergency Use Authorization (EUA). This EUA will remain  in effect (meaning this test can be used) for the duration of the COVID-19 declaration under Section 564(b)(1) of the Act, 21 U.S.C.section 360bbb-3(b)(1), unless the authorization is terminated  or revoked sooner.       Influenza A by PCR NEGATIVE NEGATIVE Final   Influenza B by PCR NEGATIVE NEGATIVE Final    Comment: (NOTE) The Xpert Xpress SARS-CoV-2/FLU/RSV plus assay is intended as an aid in the diagnosis of influenza from Nasopharyngeal swab specimens and should not be used as a sole basis for treatment. Nasal washings and aspirates are unacceptable for Xpert Xpress SARS-CoV-2/FLU/RSV testing.  Fact Sheet for Patients: EntrepreneurPulse.com.au  Fact Sheet for Healthcare Providers: IncredibleEmployment.be  This test is not yet approved or cleared by the [place] FDA and has been authorized for detection and/or diagnosis of SARS-CoV-2 by FDA under an Emergency Use Authorization (EUA). This EUA will remain in effect (meaning this test can be used) for the duration of the COVID-19 declaration under Section 564(b)(1) of the Act, 21 U.S.C. section 360bbb-3(b)(1), unless the authorization is terminated or revoked.  Performed at [facility], [address] [place], [place] [address]   MRSA Next Gen by PCR, Nasal     Status: None   Collection Time: 04/26/21  6:35 PM    Specimen: Nasal Mucosa; Nasal Swab  Result Value Ref Range Status   MRSA by PCR Next Gen NOT DETECTED NOT DETECTED Final    Comment: (NOTE) The GeneXpert MRSA Assay (FDA approved for NASAL specimens only), is one component of a comprehensive MRSA colonization surveillance program. It is not intended to diagnose MRSA infection nor to guide or monitor treatment for MRSA infections. Test performance is not FDA approved in patients less than 26 years old. Performed at [facility], [address] [place], [place] [address]   Cath Tip Culture     Status: None   Collection Time: 04/27/21 11:59 AM   Specimen: Catheter Tip; Other  Result Value Ref Range Status   Specimen Description   Final    CATH TIP Performed at [facility], [address], [place], [place] [address]    Special Requests   Final    NONE Performed at [facility], [address] [place], [place] [address]    Culture   Final    NO GROWTH Performed at [facility], [place] [address], [place], [place] [address]    Report Status 05/01/2021 FINAL  Final  CULTURE, BLOOD (ROUTINE X 2) w Reflex to ID Panel     Status: None   Collection Time: 04/28/21 11:34 AM   Specimen: BLOOD  Result Value Ref Range Status   Specimen Description BLOOD RIGHT [facility]  Final   Special Requests   Final    BOTTLES DRAWN AEROBIC ONLY Blood Culture results may not be optimal due to an inadequate volume of blood received in culture bottles   Culture   Final    NO GROWTH 5 DAYS Performed at [facility], [address] [place], [place] [address]  Report Status 05/03/2021 FINAL  Final  CULTURE, BLOOD (ROUTINE X 2) w Reflex to ID Panel     Status: None   Collection Time: 04/28/21  5:27 PM   Specimen: BLOOD  Result Value Ref Range Status   Specimen Description BLOOD LT FINGER IN HAND  Final   Special Requests BOTTLES DRAWN AEROBIC ONLY  BCAV  Final   Culture   Final    NO GROWTH 5 DAYS Performed at  [facility], [address] [place], [place] [address]    Report Status 05/03/2021 FINAL  Final    Coagulation Studies: No results for input(s): LABPROT, INR in the last 72 hours.   Urinalysis: No results for input(s): COLORURINE, LABSPEC, PHURINE, GLUCOSEU, HGBUR, BILIRUBINUR, KETONESUR, PROTEINUR, UROBILINOGEN, NITRITE, LEUKOCYTESUR in the last 72 hours.  Invalid input(s): APPERANCEUR    Imaging: No results found.   Medications:    ampicillin-sulbactam (UNASYN) IV 3 g (05/03/21 0836)    vitamin C  500 mg Oral BID   atorvastatin  20 mg Per Tube QPM   chlorhexidine  15 mL Mouth Rinse BID   Chlorhexidine Gluconate Cloth  6 each Topical Q0600   cholecalciferol  1,000 Units Per Tube Daily   feeding supplement (NEPRO CARB STEADY)  237 mL Oral BID BM   fentaNYL  1 patch Transdermal Q72H   heparin  5,000 Units Subcutaneous Q8H   lipase/protease/amylase  36,000 Units Oral TID AC   mouth rinse  15 mL Mouth Rinse q12n4p   midodrine  10 mg Per Tube TID WC   mirtazapine  7.5 mg Per Tube QHS   multivitamin  1 tablet Per Tube QHS   nutrition supplement (JUVEN)  1 packet Per Tube BID BM   QUEtiapine  25 mg Per Tube BID   silver sulfADIAZINE   Topical BID   cyanocobalamin  1,000 mcg Per Tube Daily   acetaminophen **OR** acetaminophen, albuterol, dextrose, diphenhydrAMINE, docusate sodium, fentaNYL (SUBLIMAZE) injection, heparin sodium (porcine), oxyCODONE-acetaminophen, polyethylene glycol, polyvinyl alcohol  Assessment/ Plan:  [name] is a 60 y.o. black female with end stage renal disease on hemodialysis for greater than 14 years, hypertension, congestive heart failure, coronary artery disease, diabetes mellitus type II, who is admitted to [facility] on 04/25/2021 for Sepsis ([place]) [A41.9] Severe sepsis ([place]) [A41.9, R65.20] Fever in other diseases [R50.81]   [facility] MWF Lt Permcath 74kg   End Stage Renal Disease on dialysis: CRRT d/c on 8/8; patient is off  pressors.   HD/CRRT via temp cath Receiving dialysis today, Uf goal 2.5L as tolerated Contacted Vascular for permcath placement early next week Discussed with patient that she can not be discharged with the temp cath she has currently.Informed her that vascular would evaluate her next week for a permanent access. She reiterated she didn't want further procedures and she had been told she was very limited on access sites. She has agreed to dialysis today stating, "Since I'm already here, lets do it." Patient did not offer a definitive response to having permcath procedure next week.  Palliative care consulted and [place] discussions underway. Patient states that son is not her HCPOA, but she wants his acceptance of her decision for Hospice.   2. Sepsis: with hypotension and fever.  - permcath removed, tip cultures are pending- neg so far - Acinetobacter bacteremia   - off vasopressors; critical care as above.    3. Anemia of chronic kidney disease  Lab Results  Component Value Date   HGB  9.8 (L) 05/01/2021   Hgb at goal Scheduled EPO SQ weekly saturday   4. Secondary Hyperparathyroidism:    Lab Results  Component Value Date   PTH 357 (H) 10/05/2018   CALCIUM 8.1 (L) 05/02/2021   CAION 1.09 (L) 04/13/2020   PHOS 1.9 (L) 05/02/2021    Calcium and phosphorus not at goal   Hold phosphate binders until able to have oral intake.   5.  Hypokalemia Stable  6. Volume overload - Volume removal IHD as tolerated -UF goal of 2.5L    LOS: 8   8/12/20223:27 PM

## 2021-05-03 NOTE — Progress Notes (Signed)
HD end at 1736. Pt still has significant edema. Hospital MD present and aware. Candiss Norse, MD aware as well. Possible HD treatment tomorrow. Pt met uf goal of 2.5L. Alert, stable, report to primary RN and ccmd notified of pt departure from HD unit. Catheter limbs locked with 1.29m heparin each.

## 2021-05-03 NOTE — Progress Notes (Signed)
UF goal increased by Gust Rung, NP aware.

## 2021-05-04 LAB — GLUCOSE, CAPILLARY
Glucose-Capillary: 86 mg/dL (ref 70–99)
Glucose-Capillary: 89 mg/dL (ref 70–99)
Glucose-Capillary: 94 mg/dL (ref 70–99)

## 2021-05-04 NOTE — Progress Notes (Signed)
PROGRESS NOTE    [name]  SJG:[id number] DOB: 1961/03/31 DOA: 04/25/2021 PCP: Denton Lank, MD   Chief complaint altered mental status. Brief Narrative:   [name] is a 60 y.o. female with medical history significant for end-stage renal disease on hemodialysis on Monday, Wednesday, Friday schedule, diabetes mellitus complicated by peripheral polyneuropathy and gastroparesis,, hyperlipidemia, anemia of chronic kidney disease with baseline hemoglobin 8-10, hypertension, who is admitted to [facility] on 04/25/2021 with severe sepsis after presenting from home to [facility] ED complaining of fever.  Patient was diagnosed with septic shock, blood culture positive for Acinetobacter calcoaceticus.  This appears to be secondary to dialysis catheter infection.  Patient also developed acute hypoxemic respiratory failure required mechanical ventilation. Permacath was removed on 8/06.  Patient treated with Unasyn, last dose 8/20. Transfer out of ICU on 8/9.   Assessment & Plan:   Principal Problem:   Severe sepsis with septic shock (HCC) Active Problems:   Diabetes mellitus, type II ([place])   Hypoglycemia associated with diabetes ([place])   Generalized weakness   Acute hypoxemic respiratory failure (HCC)   Pressure ulcer, stage 2 (HCC)   ESRD (end stage renal disease) (HCC)   GERD (gastroesophageal reflux disease)   Acute metabolic encephalopathy   HCAP (healthcare-associated pneumonia)   Anemia of chronic kidney failure   Thrombocytopenia ([place])  #1.  Septic shock with Acinetobacter septicemia secondary to dialysis catheter infection. Acute hypoxemic respite failure secondary to septic shock. Permacath infection Condition had improved.  Continue antibiotics with Unasyn, last dose 8/20.  2.  End-stage renal disease. Hypophosphatemia. Hyponatremia. Had a discussion with the patient, her mental status has improved, she no longer has any confusion.  However, she could not make a  decision about permacath versus hospice.  She want to sit on it until Monday.  Discussed with patient son, he will also talk to his mom.  DVT prophylaxis: Heparin Code Status: DNR Family Communication: Son updated. Disposition Plan:    Status is: Inpatient  Remains inpatient appropriate because:Inpatient level of care appropriate due to severity of illness  Dispo: The patient is from: Home              Anticipated d/c is to: Home              Patient currently is not medically stable to d/c.   Difficult to place patient No        I/O last 3 completed shifts: In: 36 [P.O.:300; IV Piggyback:500] Out: 2500 [Other:2500] Total I/O In: -  Out: 1 [Stool:1]     Consultants:  Nephrology, ID  Procedures: None  Antimicrobials:  Unasyn  Subjective: Does not have any confusion today.  We had a long discussion, patient is frustrated with her blood draws.  She is also undecided about Mondays permacath placement. No short of breath or cough. No abdominal pain or nausea vomiting. No fever or chills.  Objective: Vitals:   05/03/21 1740 05/03/21 1949 05/04/21 0503 05/04/21 0810  BP: (!) 156/66 (!) 160/59 (!) 145/64 (!) 163/67  Pulse: 77 80 76 77  Resp: 17 16 20 18   Temp:  99.9 F (37.7 C) 98.8 F (37.1 C) 97.9 F (36.6 C)  TempSrc:  Oral Oral Oral  SpO2: 100% 100% 100% 100%  Weight:      Height:        Intake/Output Summary (Last 24 hours) at 05/04/2021 1425 Last data filed at 05/04/2021 1200 Gross per 24 hour  Intake 800 ml  Output 2501 ml  Net -1701 ml   Filed Weights   04/29/21 0454 04/30/21 0500 05/01/21 0500  Weight: 83.1 kg 80.6 kg 75.5 kg    Examination:  General exam: Appears calm and comfortable  Respiratory system: Clear to auscultation. Respiratory effort normal. Cardiovascular system: S1 & S2 heard, RRR. No JVD, murmurs, rubs, gallops or clicks. No pedal edema. Gastrointestinal system: Abdomen is nondistended, soft and nontender. No organomegaly  or masses felt. Normal bowel sounds heard. Central nervous system: Alert and oriented x3. No focal neurological deficits. Extremities: Symmetric 5 x 5 power. Skin: No rashes, lesions or ulcers Psychiatry: Mood & affect appropriate.     Data Reviewed: I have personally reviewed following labs and imaging studies  CBC: Recent Labs  Lab 04/28/21 0447 04/28/21 1718 04/29/21 0411 04/29/21 1318 04/30/21 0512 05/01/21 0433  WBC 10.2  --  6.2  --  7.0 6.0  HGB 7.0* 7.5* 7.1* 8.6* 9.2* 9.8*  HCT 21.4* 23.7* 21.9* 26.6* 29.2* 30.7*  MCV 87.3  --  90.1  --  88.5 86.2  PLT 140*  --  115*  --  107* 672*   Basic Metabolic Panel: Recent Labs  Lab 04/28/21 0447 04/28/21 1718 04/29/21 0411 04/30/21 0512 05/01/21 0433 05/02/21 0540  NA 133* 133* 132* 133* 134* 135  K 2.8* 3.1* 3.1* 3.8 3.6 3.9  CL 101 101 101 99 97* 98  CO2 27 27 28 26 27  32  GLUCOSE 114* 155* 145* 100* 193* 161*  BUN 6* 6* 6* 10 13 14   CREATININE 1.27* 1.15* 1.10* 1.59* 1.87* 1.80*  CALCIUM 8.5* 8.8* 8.3* 8.2* 8.3* 8.1*  MG 1.7  --  1.9  --  2.3 2.1  PHOS 1.5* 2.1* 2.0* 2.2* 1.8* 1.9*   GFR: Estimated Creatinine Clearance: 33.4 mL/min (A) (by C-G formula based on SCr of 1.8 mg/dL (H)). Liver Function Tests: Recent Labs  Lab 04/28/21 1718 04/29/21 0411 04/30/21 0512 05/01/21 0433 05/02/21 0540  AST  --  16  --   --   --   ALT  --  13  --   --   --   ALKPHOS  --  140*  --   --   --   BILITOT  --  0.6  --   --   --   PROT  --  6.0*  --   --   --   ALBUMIN 2.9* 2.9*  2.6* 2.4* 2.6* 2.6*   No results for input(s): LIPASE, AMYLASE in the last 168 hours. No results for input(s): AMMONIA in the last 168 hours. Coagulation Profile: No results for input(s): INR, PROTIME in the last 168 hours. Cardiac Enzymes: No results for input(s): CKTOTAL, CKMB, CKMBINDEX, TROPONINI in the last 168 hours. BNP (last 3 results) No results for input(s): PROBNP in the last 8760 hours. HbA1C: No results for input(s): HGBA1C  in the last 72 hours. CBG: Recent Labs  Lab 05/03/21 0819 05/03/21 1155 05/03/21 2117 05/04/21 0043 05/04/21 0458  GLUCAP 93 114* 105* 89 86   Lipid Profile: No results for input(s): CHOL, HDL, LDLCALC, TRIG, CHOLHDL, LDLDIRECT in the last 72 hours. Thyroid Function Tests: No results for input(s): TSH, T4TOTAL, FREET4, T3FREE, THYROIDAB in the last 72 hours. Anemia Panel: No results for input(s): VITAMINB12, FOLATE, FERRITIN, TIBC, IRON, RETICCTPCT in the last 72 hours. Sepsis Labs: No results for input(s): PROCALCITON, LATICACIDVEN in the last 168 hours.  Recent Results (from the past 240 hour(s))  Culture, blood (Routine x 2)  Status: Abnormal   Collection Time: 04/25/21  4:51 PM   Specimen: BLOOD  Result Value Ref Range Status   Specimen Description   Final    BLOOD LW Performed at [facility], [address], [place], [place] [address]    Special Requests   Final    BOTTLES DRAWN AEROBIC AND ANAEROBIC Blood Culture adequate volume Performed at [facility], [address] [place], [place] [address]    Culture  Setup Time   Final    GRAM VARIABLE COCCOBACILLI AEROBIC BOTTLE ONLY Organism ID to follow CRITICAL RESULT CALLED TO, READ BACK BY AND VERIFIED WITH: [facility] AT 1014 04/26/21.PMF Performed at [facility], [address] [place], [place] [address]    Culture ACINETOBACTER CALCOACETICUS/BAUMANNII COMPLEX (A)  Final   Report Status 04/28/2021 FINAL  Final   Organism ID, Bacteria ACINETOBACTER CALCOACETICUS/BAUMANNII COMPLEX  Final      Susceptibility   Acinetobacter calcoaceticus/baumannii complex - MIC*    CEFTAZIDIME 16 INTERMEDIATE Intermediate     CIPROFLOXACIN <=0.25 SENSITIVE Sensitive     GENTAMICIN <=1 SENSITIVE Sensitive     IMIPENEM <=0.25 SENSITIVE Sensitive     PIP/TAZO 32 INTERMEDIATE Intermediate     TRIMETH/SULFA <=20 SENSITIVE Sensitive     AMPICILLIN/SULBACTAM <=2 SENSITIVE Sensitive     *  ACINETOBACTER CALCOACETICUS/BAUMANNII COMPLEX  Blood Culture ID Panel (Reflexed)     Status: Abnormal   Collection Time: 04/25/21  4:51 PM  Result Value Ref Range Status   Enterococcus faecalis NOT DETECTED NOT DETECTED Final   Enterococcus Faecium NOT DETECTED NOT DETECTED Final   Listeria monocytogenes NOT DETECTED NOT DETECTED Final   Staphylococcus species NOT DETECTED NOT DETECTED Final   Staphylococcus aureus (BCID) NOT DETECTED NOT DETECTED Final   Staphylococcus epidermidis NOT DETECTED NOT DETECTED Final   Staphylococcus lugdunensis NOT DETECTED NOT DETECTED Final   Streptococcus species NOT DETECTED NOT DETECTED Final   Streptococcus agalactiae NOT DETECTED NOT DETECTED Final   Streptococcus pneumoniae NOT DETECTED NOT DETECTED Final   Streptococcus pyogenes NOT DETECTED NOT DETECTED Final   A.calcoaceticus-baumannii DETECTED (A) NOT DETECTED Final    Comment: CRITICAL RESULT CALLED TO, READ BACK BY AND VERIFIED WITH: CHARLES SHANLEVER AT 1014 04/26/21.PMF    Bacteroides fragilis NOT DETECTED NOT DETECTED Final   Enterobacterales NOT DETECTED NOT DETECTED Final   Enterobacter cloacae complex NOT DETECTED NOT DETECTED Final   Escherichia coli NOT DETECTED NOT DETECTED Final   Klebsiella aerogenes NOT DETECTED NOT DETECTED Final   Klebsiella oxytoca NOT DETECTED NOT DETECTED Final   Klebsiella pneumoniae NOT DETECTED NOT DETECTED Final   Proteus species NOT DETECTED NOT DETECTED Final   Salmonella species NOT DETECTED NOT DETECTED Final   Serratia marcescens NOT DETECTED NOT DETECTED Final   Haemophilus influenzae NOT DETECTED NOT DETECTED Final   Neisseria meningitidis NOT DETECTED NOT DETECTED Final   Pseudomonas aeruginosa NOT DETECTED NOT DETECTED Final   Stenotrophomonas maltophilia NOT DETECTED NOT DETECTED Final   Candida albicans NOT DETECTED NOT DETECTED Final   Candida auris NOT DETECTED NOT DETECTED Final   Candida glabrata NOT DETECTED NOT DETECTED Final    Candida krusei NOT DETECTED NOT DETECTED Final   Candida parapsilosis NOT DETECTED NOT DETECTED Final   Candida tropicalis NOT DETECTED NOT DETECTED Final   Cryptococcus neoformans/gattii NOT DETECTED NOT DETECTED Final   CTX-M ESBL NOT DETECTED NOT DETECTED Final   Carbapenem resistance IMP NOT DETECTED NOT DETECTED Final   Carbapenem resistance KPC NOT  DETECTED NOT DETECTED Final   Carbapenem resistance NDM NOT DETECTED NOT DETECTED Final   Carbapenem resistance VIM NOT DETECTED NOT DETECTED Final    Comment: Performed at [facility], [address] [place], [place] [address]  Culture, blood (Routine x 2)     Status: Abnormal   Collection Time: 04/25/21  6:38 PM   Specimen: BLOOD  Result Value Ref Range Status   Specimen Description   Final    BLOOD BLOOD LEFT WRIST Performed at [facility], [address], [place], [place] [address]    Special Requests   Final    BOTTLES DRAWN AEROBIC AND ANAEROBIC Blood Culture adequate volume Performed at [name], [address] [place], [place] [address]    Culture  Setup Time   Final    GRAM VARIABLE COCCOBACILLI AEROBIC BOTTLE ONLY CRITICAL VALUE NOTED.  VALUE IS CONSISTENT WITH PREVIOUSLY REPORTED AND CALLED VALUE. Performed at [facility], [address] [place], [place] [address]    Culture (A)  Final    ACINETOBACTER CALCOACETICUS/BAUMANNII COMPLEX SUSCEPTIBILITIES PERFORMED ON PREVIOUS CULTURE WITHIN THE LAST 5 DAYS. Performed at [facility], [place] [address] [place], [place] [address]    Report Status 04/28/2021 FINAL  Final  Resp Panel by RT-PCR (Flu A&B, Covid) Nasopharyngeal Swab     Status: None   Collection Time: 04/25/21  6:54 PM   Specimen: Nasopharyngeal Swab; Nasopharyngeal(NP) swabs in vial transport medium  Result Value Ref Range Status   SARS Coronavirus 2 by RT PCR NEGATIVE NEGATIVE Final    Comment: (NOTE) SARS-CoV-2 target nucleic acids are NOT  DETECTED.  The SARS-CoV-2 RNA is generally detectable in upper respiratory specimens during the acute phase of infection. The lowest concentration of SARS-CoV-2 viral copies this assay can detect is 138 copies/mL. A negative result does not preclude SARS-Cov-2 infection and should not be used as the sole basis for treatment or other patient management decisions. A negative result may occur with  improper specimen collection/handling, submission of specimen other than nasopharyngeal swab, presence of viral mutation(s) within the areas targeted by this assay, and inadequate number of viral copies(<138 copies/mL). A negative result must be combined with clinical observations, patient history, and epidemiological information. The expected result is Negative.  Fact Sheet for Patients:  EntrepreneurPulse.com.au  Fact Sheet for Healthcare Providers:  IncredibleEmployment.be  This test is no t yet approved or cleared by the [place] FDA and  has been authorized for detection and/or diagnosis of SARS-CoV-2 by FDA under an Emergency Use Authorization (EUA). This EUA will remain  in effect (meaning this test can be used) for the duration of the COVID-19 declaration under Section 564(b)(1) of the Act, 21 U.S.C.section 360bbb-3(b)(1), unless the authorization is terminated  or revoked sooner.       Influenza A by PCR NEGATIVE NEGATIVE Final   Influenza B by PCR NEGATIVE NEGATIVE Final    Comment: (NOTE) The Xpert Xpress SARS-CoV-2/FLU/RSV plus assay is intended as an aid in the diagnosis of influenza from Nasopharyngeal swab specimens and should not be used as a sole basis for treatment. Nasal washings and aspirates are unacceptable for Xpert Xpress SARS-CoV-2/FLU/RSV testing.  Fact Sheet for Patients: EntrepreneurPulse.com.au  Fact Sheet for Healthcare Providers: IncredibleEmployment.be  This test is not yet  approved or cleared by the [place] FDA and has been authorized for detection and/or diagnosis of SARS-CoV-2 by FDA under an Emergency Use Authorization (EUA). This EUA will remain in effect (meaning  this test can be used) for the duration of the COVID-19 declaration under Section 564(b)(1) of the Act, 21 U.S.C. section 360bbb-3(b)(1), unless the authorization is terminated or revoked.  Performed at [facility], [address] [place], [place] [address]   MRSA Next Gen by PCR, Nasal     Status: None   Collection Time: 04/26/21  6:35 PM   Specimen: Nasal Mucosa; Nasal Swab  Result Value Ref Range Status   MRSA by PCR Next Gen NOT DETECTED NOT DETECTED Final    Comment: (NOTE) The GeneXpert MRSA Assay (FDA approved for NASAL specimens only), is one component of a comprehensive MRSA colonization surveillance program. It is not intended to diagnose MRSA infection nor to guide or monitor treatment for MRSA infections. Test performance is not FDA approved in patients less than 67 years old. Performed at [facility], [address] [place], [place] [address]   Cath Tip Culture     Status: None   Collection Time: 04/27/21 11:59 AM   Specimen: Catheter Tip; Other  Result Value Ref Range Status   Specimen Description   Final    CATH TIP Performed at [facility], [address], [place], [place] [address]    Special Requests   Final    NONE Performed at [facility], [address] [place], [place] [address]    Culture   Final    NO GROWTH Performed at [facility], [place] [address], [place], [place] [address]    Report Status 05/01/2021 FINAL  Final  CULTURE, BLOOD (ROUTINE X 2) w Reflex to ID Panel     Status: None   Collection Time: 04/28/21 11:34 AM   Specimen: BLOOD  Result Value Ref Range Status   Specimen Description BLOOD RIGHT AC  Final   Special Requests   Final    BOTTLES DRAWN AEROBIC ONLY Blood Culture results  may not be optimal due to an inadequate volume of blood received in culture bottles   Culture   Final    NO GROWTH 5 DAYS Performed at [facility], [address] [place], [place] [address]    Report Status 05/03/2021 FINAL  Final  CULTURE, BLOOD (ROUTINE X 2) w Reflex to ID Panel     Status: None   Collection Time: 04/28/21  5:27 PM   Specimen: BLOOD  Result Value Ref Range Status   Specimen Description BLOOD LT FINGER IN HAND  Final   Special Requests BOTTLES DRAWN AEROBIC ONLY  BCAV  Final   Culture   Final    NO GROWTH 5 DAYS Performed at [facility], [address] [place], [place] [address]    Report Status 05/03/2021 FINAL  Final         Radiology Studies: No results found.      Scheduled Meds:  vitamin C  500 mg Oral BID   atorvastatin  20 mg Per Tube QPM   chlorhexidine  15 mL Mouth Rinse BID   Chlorhexidine Gluconate Cloth  6 each Topical Q0600   cholecalciferol  1,000 Units Per Tube Daily   feeding supplement (NEPRO CARB STEADY)  237 mL Oral BID BM   fentaNYL  1 patch Transdermal Q72H   heparin  5,000 Units Subcutaneous Q8H   lipase/protease/amylase  36,000 Units Oral TID AC   mouth rinse  15 mL Mouth Rinse q12n4p   midodrine  10 mg Per Tube TID WC   mirtazapine  7.5 mg Per  Tube QHS   multivitamin  1 tablet Per Tube QHS   nutrition supplement (JUVEN)  1 packet Per Tube BID BM   QUEtiapine  25 mg Per Tube BID   silver sulfADIAZINE   Topical BID   cyanocobalamin  1,000 mcg Per Tube Daily   Continuous Infusions:  ampicillin-sulbactam (UNASYN) IV 3 g (05/04/21 1006)     LOS: 9 days    Time spent: 28 minutes    Sharen Hones, MD [facility]   To contact the attending provider between 7A-7P or the covering provider during after hours 7P-7A, please log into the web site [email or web address] and access using universal [place] password for that web site. If you do not have the password, please call the hospital  operator.  05/04/2021, 2:25 PM

## 2021-05-04 NOTE — Progress Notes (Signed)
[facility]  ROUNDING NOTE   Subjective:   Ms. [name] was admitted to [facility] on 04/25/2021 for Sepsis [facility]) [A41.9] Severe sepsis ([place]) [A41.9, R65.20] Fever in other diseases [R50.81]  Patient was extubated on April 28, 2021 Patient seen today on the second floor Patient main complaint in today's visit was how she is dealing with her family were having a hard time ordering her healthcare wishes. Patient went on to explain how she has been through a lot of health issues. Patient went on to explain how many procedures she has had and how tired she is going through her chronic health issues. Patient became emotional during the discussion when she mentioned her grandchild .  She said that her grandchild is scared to come near her with her arm being swollen and scars from dialysis and procedures. She says she knows this will only get worse. She states she does not want any further procedures.  Patient was supposed to go on dialysis today but patient refused to go on dialysis patient said she had dialysis yesterday and she does not want to go for today.   Objective:  Vital signs in last 24 hours:  Temp:  [97.9 F (36.6 C)-99.9 F (37.7 C)] 97.9 F (36.6 C) (08/13 0810) Pulse Rate:  [66-80] 77 (08/13 0810) Resp:  [15-20] 18 (08/13 0810) BP: (145-168)/(59-71) 163/67 (08/13 0810) SpO2:  [100 %] 100 % (08/13 0810)  Weight change:  Filed Weights   04/29/21 0454 04/30/21 0500 05/01/21 0500  Weight: 83.1 kg 80.6 kg 75.5 kg    Intake/Output: I/O last 3 completed shifts: In: 800 [P.O.:300; IV Piggyback:500] Out: 2500 [Other:2500]   Intake/Output this shift:  Total I/O In: -  Out: 1 [Stool:1]  Physical Exam: General: ill appearing, frail  Head: Normocephalic, atraumatic. Moist oral mucosal membranes  Lungs:  Coarse breath sounds, normal breathing effort  Heart: Regular rate and rhythm  Abdomen:  Soft, nontender,   Extremities: 1+ peripheral edema.  Neurologic: Able  to follow commands and answer simple questions  Skin: No lesions  Access: Right femoral temporary dialysis catheter    Basic Metabolic Panel: Recent Labs  Lab 04/28/21 0447 04/28/21 1718 04/29/21 0411 04/30/21 0512 05/01/21 0433 05/02/21 0540  NA 133* 133* 132* 133* 134* 135  K 2.8* 3.1* 3.1* 3.8 3.6 3.9  CL 101 101 101 99 97* 98  CO2 27 27 28 26 27  32  GLUCOSE 114* 155* 145* 100* 193* 161*  BUN 6* 6* 6* 10 13 14   CREATININE 1.27* 1.15* 1.10* 1.59* 1.87* 1.80*  CALCIUM 8.5* 8.8* 8.3* 8.2* 8.3* 8.1*  MG 1.7  --  1.9  --  2.3 2.1  PHOS 1.5* 2.1* 2.0* 2.2* 1.8* 1.9*    Liver Function Tests: Recent Labs  Lab 04/28/21 1718 04/29/21 0411 04/30/21 0512 05/01/21 0433 05/02/21 0540  AST  --  16  --   --   --   ALT  --  13  --   --   --   ALKPHOS  --  140*  --   --   --   BILITOT  --  0.6  --   --   --   PROT  --  6.0*  --   --   --   ALBUMIN 2.9* 2.9*  2.6* 2.4* 2.6* 2.6*   No results for input(s): LIPASE, AMYLASE in the last 168 hours.  No results for input(s): AMMONIA in the last 168 hours.   CBC: Recent  Labs  Lab 04/28/21 0447 04/28/21 1718 04/29/21 0411 04/29/21 1318 04/30/21 0512 05/01/21 0433  WBC 10.2  --  6.2  --  7.0 6.0  HGB 7.0* 7.5* 7.1* 8.6* 9.2* 9.8*  HCT 21.4* 23.7* 21.9* 26.6* 29.2* 30.7*  MCV 87.3  --  90.1  --  88.5 86.2  PLT 140*  --  115*  --  107* 123*    Cardiac Enzymes: No results for input(s): CKTOTAL, CKMB, CKMBINDEX, TROPONINI in the last 168 hours.  BNP: Invalid input(s): POCBNP  CBG: Recent Labs  Lab 05/03/21 0819 05/03/21 1155 05/03/21 2117 05/04/21 0043 05/04/21 0458  GLUCAP 93 114* 105* 89 78    Microbiology: Results for orders placed or performed during the hospital encounter of 04/25/21  Culture, blood (Routine x 2)     Status: Abnormal   Collection Time: 04/25/21  4:51 PM   Specimen: BLOOD  Result Value Ref Range Status   Specimen Description   Final    BLOOD LW Performed at [facility], [address], [place], [place] [address]    Special Requests   Final    BOTTLES DRAWN AEROBIC AND ANAEROBIC Blood Culture adequate volume Performed at [facility], [address] [place], [place] [address]    Culture  Setup Time   Final    GRAM VARIABLE COCCOBACILLI AEROBIC BOTTLE ONLY Organism ID to follow CRITICAL RESULT CALLED TO, READ BACK BY AND VERIFIED WITH: [facility] AT 4287 04/26/21.PMF Performed at [facility], [address] [place], [place] [address]    Culture ACINETOBACTER CALCOACETICUS/BAUMANNII COMPLEX (A)  Final   Report Status 04/28/2021 FINAL  Final   Organism ID, Bacteria ACINETOBACTER CALCOACETICUS/BAUMANNII COMPLEX  Final      Susceptibility   Acinetobacter calcoaceticus/baumannii complex - MIC*    CEFTAZIDIME 16 INTERMEDIATE Intermediate     CIPROFLOXACIN <=0.25 SENSITIVE Sensitive     GENTAMICIN <=1 SENSITIVE Sensitive     IMIPENEM <=0.25 SENSITIVE Sensitive     PIP/TAZO 32 INTERMEDIATE Intermediate     TRIMETH/SULFA <=20 SENSITIVE Sensitive     AMPICILLIN/SULBACTAM <=2 SENSITIVE Sensitive     * ACINETOBACTER CALCOACETICUS/BAUMANNII COMPLEX  Blood Culture ID Panel (Reflexed)     Status: Abnormal   Collection Time: 04/25/21  4:51 PM  Result Value Ref Range Status   Enterococcus faecalis NOT DETECTED NOT DETECTED Final   Enterococcus Faecium NOT DETECTED NOT DETECTED Final   Listeria monocytogenes NOT DETECTED NOT DETECTED Final   Staphylococcus species NOT DETECTED NOT DETECTED Final   Staphylococcus aureus (BCID) NOT DETECTED NOT DETECTED Final   Staphylococcus epidermidis NOT DETECTED NOT DETECTED Final   Staphylococcus lugdunensis NOT DETECTED NOT DETECTED Final   Streptococcus species NOT DETECTED NOT DETECTED Final   Streptococcus agalactiae NOT DETECTED NOT DETECTED Final   Streptococcus pneumoniae NOT DETECTED NOT DETECTED Final   Streptococcus pyogenes NOT DETECTED NOT DETECTED Final   A.calcoaceticus-baumannii  DETECTED (A) NOT DETECTED Final    Comment: CRITICAL RESULT CALLED TO, READ BACK BY AND VERIFIED WITH: CHARLES SHANLEVER AT 1014 04/26/21.PMF    Bacteroides fragilis NOT DETECTED NOT DETECTED Final   Enterobacterales NOT DETECTED NOT DETECTED Final   Enterobacter cloacae complex NOT DETECTED NOT DETECTED Final   Escherichia coli NOT DETECTED NOT DETECTED Final   Klebsiella aerogenes NOT DETECTED NOT DETECTED Final   Klebsiella oxytoca NOT DETECTED NOT DETECTED Final   Klebsiella pneumoniae NOT DETECTED NOT DETECTED Final   Proteus species NOT DETECTED NOT DETECTED Final   Salmonella  species NOT DETECTED NOT DETECTED Final   Serratia marcescens NOT DETECTED NOT DETECTED Final   Haemophilus influenzae NOT DETECTED NOT DETECTED Final   Neisseria meningitidis NOT DETECTED NOT DETECTED Final   Pseudomonas aeruginosa NOT DETECTED NOT DETECTED Final   Stenotrophomonas maltophilia NOT DETECTED NOT DETECTED Final   Candida albicans NOT DETECTED NOT DETECTED Final   Candida auris NOT DETECTED NOT DETECTED Final   Candida glabrata NOT DETECTED NOT DETECTED Final   Candida krusei NOT DETECTED NOT DETECTED Final   Candida parapsilosis NOT DETECTED NOT DETECTED Final   Candida tropicalis NOT DETECTED NOT DETECTED Final   Cryptococcus neoformans/gattii NOT DETECTED NOT DETECTED Final   CTX-M ESBL NOT DETECTED NOT DETECTED Final   Carbapenem resistance IMP NOT DETECTED NOT DETECTED Final   Carbapenem resistance KPC NOT DETECTED NOT DETECTED Final   Carbapenem resistance NDM NOT DETECTED NOT DETECTED Final   Carbapenem resistance VIM NOT DETECTED NOT DETECTED Final    Comment: Performed at [facility], [address] [place], [place] [address]  Culture, blood (Routine x 2)     Status: Abnormal   Collection Time: 04/25/21  6:38 PM   Specimen: BLOOD  Result Value Ref Range Status   Specimen Description   Final    BLOOD BLOOD LEFT WRIST Performed at [facility], [address] [place], [place] [address]    Special Requests   Final    BOTTLES DRAWN AEROBIC AND ANAEROBIC Blood Culture adequate volume Performed at [facility], [address] [place], [place] [address]    Culture  Setup Time   Final    GRAM VARIABLE COCCOBACILLI AEROBIC BOTTLE ONLY CRITICAL VALUE NOTED.  VALUE IS CONSISTENT WITH PREVIOUSLY REPORTED AND CALLED VALUE. Performed at [facility], [address] [place], [place] [address]    Culture (A)  Final    ACINETOBACTER CALCOACETICUS/BAUMANNII COMPLEX SUSCEPTIBILITIES PERFORMED ON PREVIOUS CULTURE WITHIN THE LAST 5 DAYS. Performed at [facility], [address], [place], [place] [address]    Report Status 04/28/2021 FINAL  Final  Resp Panel by RT-PCR (Flu A&B, Covid) Nasopharyngeal Swab     Status: None   Collection Time: 04/25/21  6:54 PM   Specimen: Nasopharyngeal Swab; Nasopharyngeal(NP) swabs in vial transport medium  Result Value Ref Range Status   SARS Coronavirus 2 by RT PCR NEGATIVE NEGATIVE Final    Comment: (NOTE) SARS-CoV-2 target nucleic acids are NOT DETECTED.  The SARS-CoV-2 RNA is generally detectable in upper respiratory specimens during the acute phase of infection. The lowest concentration of SARS-CoV-2 viral copies this assay can detect is 138 copies/mL. A negative result does not preclude SARS-Cov-2 infection and should not be used as the sole basis for treatment or other patient management decisions. A negative result may occur with  improper specimen collection/handling, submission of specimen other than nasopharyngeal swab, presence of viral mutation(s) within the areas targeted by this assay, and inadequate number of viral copies(<138 copies/mL). A negative result must be combined with clinical observations, patient history, and epidemiological information. The expected result is Negative.  Fact Sheet for Patients:  EntrepreneurPulse.com.au  Fact Sheet for  Healthcare Providers:  IncredibleEmployment.be  This test is no t yet approved or cleared by the [place] FDA and  has been authorized for detection and/or diagnosis of SARS-CoV-2 by FDA under an Emergency Use Authorization (EUA). This EUA will remain  in effect (meaning this test can be used) for the duration of the COVID-19 declaration  under Section 564(b)(1) of the Act, 21 U.S.C.section 360bbb-3(b)(1), unless the authorization is terminated  or revoked sooner.       Influenza A by PCR NEGATIVE NEGATIVE Final   Influenza B by PCR NEGATIVE NEGATIVE Final    Comment: (NOTE) The Xpert Xpress SARS-CoV-2/FLU/RSV plus assay is intended as an aid in the diagnosis of influenza from Nasopharyngeal swab specimens and should not be used as a sole basis for treatment. Nasal washings and aspirates are unacceptable for Xpert Xpress SARS-CoV-2/FLU/RSV testing.  Fact Sheet for Patients: EntrepreneurPulse.com.au  Fact Sheet for Healthcare Providers: IncredibleEmployment.be  This test is not yet approved or cleared by the [place] FDA and has been authorized for detection and/or diagnosis of SARS-CoV-2 by FDA under an Emergency Use Authorization (EUA). This EUA will remain in effect (meaning this test can be used) for the duration of the COVID-19 declaration under Section 564(b)(1) of the Act, 21 U.S.C. section 360bbb-3(b)(1), unless the authorization is terminated or revoked.  Performed at [facility], [address] [place], [place] [address]   MRSA Next Gen by PCR, Nasal     Status: None   Collection Time: 04/26/21  6:35 PM   Specimen: Nasal Mucosa; Nasal Swab  Result Value Ref Range Status   MRSA by PCR Next Gen NOT DETECTED NOT DETECTED Final    Comment: (NOTE) The GeneXpert MRSA Assay (FDA approved for NASAL specimens only), is one component of a comprehensive MRSA colonization surveillance program. It is  not intended to diagnose MRSA infection nor to guide or monitor treatment for MRSA infections. Test performance is not FDA approved in patients less than 46 years old. Performed at [facility], [address] [place], [place] [address]   Cath Tip Culture     Status: None   Collection Time: 04/27/21 11:59 AM   Specimen: Catheter Tip; Other  Result Value Ref Range Status   Specimen Description   Final    CATH TIP Performed at [facility], [address], [place], [place] [address]    Special Requests   Final    NONE Performed at [facility], [address], [place], [place] [address]    Culture   Final    NO GROWTH Performed at [facility], [place] [address], [place], [place] [address]    Report Status 05/01/2021 FINAL  Final  CULTURE, BLOOD (ROUTINE X 2) w Reflex to ID Panel     Status: None   Collection Time: 04/28/21 11:34 AM   Specimen: BLOOD  Result Value Ref Range Status   Specimen Description BLOOD RIGHT AC  Final   Special Requests   Final    BOTTLES DRAWN AEROBIC ONLY Blood Culture results may not be optimal due to an inadequate volume of blood received in culture bottles   Culture   Final    NO GROWTH 5 DAYS Performed at [facility], [address] [place], [place] [address]    Report Status 05/03/2021 FINAL  Final  CULTURE, BLOOD (ROUTINE X 2) w Reflex to ID Panel     Status: None   Collection Time: 04/28/21  5:27 PM   Specimen: BLOOD  Result Value Ref Range Status   Specimen Description BLOOD LT FINGER IN HAND  Final   Special Requests BOTTLES DRAWN AEROBIC ONLY  BCAV  Final   Culture   Final    NO GROWTH 5 DAYS Performed at [facility], [address], [place], [place] [address]  Report Status 05/03/2021 FINAL  Final    Coagulation Studies: No results for input(s): LABPROT, INR in the last 72 hours.   Urinalysis: No results for input(s): COLORURINE, LABSPEC, PHURINE, GLUCOSEU, HGBUR,  BILIRUBINUR, KETONESUR, PROTEINUR, UROBILINOGEN, NITRITE, LEUKOCYTESUR in the last 72 hours.  Invalid input(s): APPERANCEUR    Imaging: No results found.   Medications:    ampicillin-sulbactam (UNASYN) IV 3 g (05/04/21 1006)    vitamin C  500 mg Oral BID   atorvastatin  20 mg Per Tube QPM   chlorhexidine  15 mL Mouth Rinse BID   Chlorhexidine Gluconate Cloth  6 each Topical Q0600   cholecalciferol  1,000 Units Per Tube Daily   feeding supplement (NEPRO CARB STEADY)  237 mL Oral BID BM   fentaNYL  1 patch Transdermal Q72H   heparin  5,000 Units Subcutaneous Q8H   lipase/protease/amylase  36,000 Units Oral TID AC   mouth rinse  15 mL Mouth Rinse q12n4p   midodrine  10 mg Per Tube TID WC   mirtazapine  7.5 mg Per Tube QHS   multivitamin  1 tablet Per Tube QHS   nutrition supplement (JUVEN)  1 packet Per Tube BID BM   QUEtiapine  25 mg Per Tube BID   silver sulfADIAZINE   Topical BID   cyanocobalamin  1,000 mcg Per Tube Daily   acetaminophen **OR** acetaminophen, albuterol, dextrose, diphenhydrAMINE, docusate sodium, fentaNYL (SUBLIMAZE) injection, heparin sodium (porcine), oxyCODONE-acetaminophen, polyethylene glycol, polyvinyl alcohol  Assessment/ Plan:  Ms. CLORISSA GRUENBERG is a 60 y.o. black female with end stage renal disease on hemodialysis for greater than 14 years, hypertension, congestive heart failure, coronary artery disease, diabetes mellitus type II, who is admitted to [facility] on 04/25/2021 for Sepsis ([place]) [A41.9] Severe sepsis ([place]) [A41.9, R65.20] Fever in other diseases [R50.81]   [facility] Perm[facility]ath 74kg      1)Renal    Patient has end-stage renal disease Patient at the time of admission was on pressors and required CRRT.  Patient CRRT was DC'd on August 8. Patient has been on intermittent dialysis now via temporary catheter Patient was last dialyzed yesterday. Because of patient's fluid situation and the plan was to dialyze patient today but  patient refused to go for dialysis today.  Educated patient at length that why she is being dialyzed today again but patient continues to state that she does not want to go for another dialysis session. I then also discussed with the patient regarding her access.  Educated patient that she has a temporary access and patient cannot be discharged at this access she will rather need a tunneled catheter.  I did inform the patient that vascular surgery will discuss more about these options   2)HTN    Blood pressure is stable for the acute state. Renal replacement therapy/fluid removal should help We will follow   3)Anemia of chronic disease  CBC Latest Ref Rng & Units 05/01/2021 04/30/2021 04/29/2021  WBC 4.0 - 10.5 K/uL 6.0 7.0 -  Hemoglobin 12.0 - 15.0 g/dL 9.8(L) 9.2(L) 8.6(L)  Hematocrit 36.0 - 46.0 % 30.7(L) 29.2(L) 26.6(L)  Platelets 150 - 400 K/uL 123(L) 107(L) -       HGb at goal (9--11)   4) Secondary hyperparathyroidism -CKD Mineral-Bone Disorder    Lab Results  Component Value Date   PTH 357 (H) 10/05/2018   CALCIUM 8.1 (L) 05/02/2021   CAION 1.09 (L) 04/13/2020   PHOS 1.9 (L) 05/02/2021    Secondary Hyperparathyroidism present  Phosphorus is not at goal. Patient binders are currently on hold  5) social/palliative care Palliative care consulted and [place] discussions underway. Patient states that son is not her HCPOA, but she wants his acceptance of her decision for Hospice.    6) Electrolytes   BMP Latest Ref Rng & Units 05/02/2021 05/01/2021 04/30/2021  Glucose 70 - 99 mg/dL 161(H) 193(H) 100(H)  BUN 8 - 23 mg/dL 14 13 10   Creatinine 0.44 - 1.00 mg/dL 1.80(H) 1.87(H) 1.59(H)  Sodium 135 - 145 mmol/L 135 134(L) 133(L)  Potassium 3.5 - 5.1 mmol/L 3.9 3.6 3.8  Chloride 98 - 111 mmol/L 98 97(L) 99  CO2 22 - 32 mmol/L 32 27 26  Calcium 8.9 - 10.3 mg/dL 8.1(L) 8.3(L) 8.2(L)     Sodium Normonatremic   Potassium Normokalemic    7)Acid base   Co2 at  goal  8)Sepsis Patient was admitted with the sepsis Patient tunneled catheter has since been removed and a temporary catheter was placed   Plan   Patient unwilling to go for renal replacement therapy today We will continue to follow   LOS: 9 Mabel Unrein s [facility] 8/13/20223:15 PM

## 2021-05-04 NOTE — Progress Notes (Signed)
Spoke to care RN this morning to get report before bringing patient for dialysis. Pt stated " I do not want to go for dialysis today" Stated " I do not go today I went yesterday" Dr. Theador Hawthorne was made aware of patient's refusal and Care RN is a witness since she told me over the phone and I was bale to hear the patient in the background.

## 2021-05-05 LAB — RENAL FUNCTION PANEL
Albumin: 2.3 g/dL — ABNORMAL LOW (ref 3.5–5.0)
Anion gap: 8 (ref 5–15)
BUN: 36 mg/dL — ABNORMAL HIGH (ref 8–23)
CO2: 29 mmol/L (ref 22–32)
Calcium: 8.4 mg/dL — ABNORMAL LOW (ref 8.9–10.3)
Chloride: 96 mmol/L — ABNORMAL LOW (ref 98–111)
Creatinine, Ser: 3.22 mg/dL — ABNORMAL HIGH (ref 0.44–1.00)
GFR, Estimated: 16 mL/min — ABNORMAL LOW (ref >60)
Glucose, Bld: 112 mg/dL — ABNORMAL HIGH (ref 70–99)
Phosphorus: 4 mg/dL (ref 2.5–4.6)
Potassium: 4.3 mmol/L (ref 3.5–5.1)
Sodium: 133 mmol/L — ABNORMAL LOW (ref 135–145)

## 2021-05-05 LAB — GLUCOSE, CAPILLARY
Glucose-Capillary: 124 mg/dL — ABNORMAL HIGH (ref 70–99)
Glucose-Capillary: 109 mg/dL — ABNORMAL HIGH (ref 70–99)
Glucose-Capillary: 133 mg/dL — ABNORMAL HIGH (ref 70–99)
Glucose-Capillary: 86 mg/dL (ref 70–99)
Glucose-Capillary: 133 mg/dL — ABNORMAL HIGH (ref 70–99)

## 2021-05-05 LAB — MAGNESIUM: Magnesium: 2.2 mg/dL (ref 1.7–2.4)

## 2021-05-05 MED ORDER — LOPERAMIDE HCL 2 MG PO CAPS
2.0000 mg | ORAL_CAPSULE | Freq: Two times a day (BID) | ORAL | Status: DC
  Administered 2021-05-05 – 2021-05-10 (×11): 2 mg via ORAL
  Filled 2021-05-05 (×11): qty 1

## 2021-05-05 NOTE — Progress Notes (Signed)
Carman Ching visited with patient per request from her nurse. Room was made for storytelling, deep listening, and prayer. Patient shared her heart related to concerns and fear as she contemplates the possibilities between life and death. Concerns for her family, her cat, and her plants were spoken of with sincerity. The visit was appreciated as a spiritual conversation was necessary to allow her to "get things off my chest". Chaplain available for continued support per On Call Chaplain at [phone].

## 2021-05-05 NOTE — Progress Notes (Signed)
[facility]  ROUNDING NOTE   Subjective:   [name] was admitted to [facility] on 04/25/2021 for Sepsis [facility]) [A41.9] Severe sepsis ([place]) [A41.9, R65.20] Fever in other diseases [R50.81]  Patient was extubated on April 28, 2021 Patient seen today on the second floor Patient seen today on second floor Patient offers no new specific physical complaint. I then again discussed with the patient the topic of dialysis as patient had refused dialysis yesterday. Patient said she would go on dialysis tomorrow.     Objective:  Vital signs in last 24 hours:  Temp:  [98.1 F (36.7 C)-98.8 F (37.1 C)] 98.1 F (36.7 C) (08/14 0750) Pulse Rate:  [70-73] 71 (08/14 0750) Resp:  [12-20] 12 (08/14 0750) BP: (138-170)/(49-68) 158/68 (08/14 0750) SpO2:  [100 %] 100 % (08/14 0750)  Weight change:  Filed Weights   04/29/21 0454 04/30/21 0500 05/01/21 0500  Weight: 83.1 kg 80.6 kg 75.5 kg    Intake/Output: I/O last 3 completed shifts: In: 800 [P.O.:300; IV Piggyback:500] Out: 2 [Stool:2]   Intake/Output this shift:  No intake/output data recorded.  Physical Exam: General: ill appearing, frail  Head: Normocephalic, atraumatic. Moist oral mucosal membranes  Lungs:  Coarse breath sounds, normal breathing effort  Heart: Regular rate and rhythm  Abdomen:  Soft, nontender,   Extremities: 1+ peripheral edema.  Neurologic: Able to follow commands and answer simple questions  Skin: No lesions  Access: Right femoral temporary dialysis catheter    Basic Metabolic Panel: Recent Labs  Lab 04/29/21 0411 04/30/21 0512 05/01/21 0433 05/02/21 0540 05/05/21 0607  NA 132* 133* 134* 135 133*  K 3.1* 3.8 3.6 3.9 4.3  CL 101 99 97* 98 96*  CO2 28 26 27  32 29  GLUCOSE 145* 100* 193* 161* 112*  BUN 6* 10 13 14  36*  CREATININE 1.10* 1.59* 1.87* 1.80* 3.22*  CALCIUM 8.3* 8.2* 8.3* 8.1* 8.4*  MG 1.9  --  2.3 2.1 2.2  PHOS 2.0* 2.2* 1.8* 1.9* 4.0    Liver Function Tests: Recent  Labs  Lab 04/29/21 0411 04/30/21 0512 05/01/21 0433 05/02/21 0540 05/05/21 0607  AST 16  --   --   --   --   ALT 13  --   --   --   --   ALKPHOS 140*  --   --   --   --   BILITOT 0.6  --   --   --   --   PROT 6.0*  --   --   --   --   ALBUMIN 2.9*  2.6* 2.4* 2.6* 2.6* 2.3*   No results for input(s): LIPASE, AMYLASE in the last 168 hours.  No results for input(s): AMMONIA in the last 168 hours.   CBC: Recent Labs  Lab 04/28/21 1718 04/29/21 0411 04/29/21 1318 04/30/21 0512 05/01/21 0433  WBC  --  6.2  --  7.0 6.0  HGB 7.5* 7.1* 8.6* 9.2* 9.8*  HCT 23.7* 21.9* 26.6* 29.2* 30.7*  MCV  --  90.1  --  88.5 86.2  PLT  --  115*  --  107* 123*    Cardiac Enzymes: No results for input(s): CKTOTAL, CKMB, CKMBINDEX, TROPONINI in the last 168 hours.  BNP: Invalid input(s): POCBNP  CBG: Recent Labs  Lab 05/04/21 0458 05/04/21 2115 05/05/21 0256 05/05/21 0540 05/05/21 0747  GLUCAP 86 94 124* 109* 133*    Microbiology: Results for orders placed or performed during the hospital encounter of 04/25/21  Culture, blood (Routine x 2)     Status: Abnormal   Collection Time: 04/25/21  4:51 PM   Specimen: BLOOD  Result Value Ref Range Status   Specimen Description   Final    BLOOD LW Performed at [facility], [address] [place], [place] [address]    Special Requests   Final    BOTTLES DRAWN AEROBIC AND ANAEROBIC Blood Culture adequate volume Performed at [facility], [address] [place], [place] [address]    Culture  Setup Time   Final    GRAM VARIABLE COCCOBACILLI AEROBIC BOTTLE ONLY Organism ID to follow CRITICAL RESULT CALLED TO, READ BACK BY AND VERIFIED WITH: [facility] AT 1014 04/26/21.PMF Performed at [facility], [address] [place], [place] [address]    Culture ACINETOBACTER CALCOACETICUS/BAUMANNII COMPLEX (A)  Final   Report Status 04/28/2021 FINAL  Final   Organism ID, Bacteria ACINETOBACTER  CALCOACETICUS/BAUMANNII COMPLEX  Final      Susceptibility   Acinetobacter calcoaceticus/baumannii complex - MIC*    CEFTAZIDIME 16 INTERMEDIATE Intermediate     CIPROFLOXACIN <=0.25 SENSITIVE Sensitive     GENTAMICIN <=1 SENSITIVE Sensitive     IMIPENEM <=0.25 SENSITIVE Sensitive     PIP/TAZO 32 INTERMEDIATE Intermediate     TRIMETH/SULFA <=20 SENSITIVE Sensitive     AMPICILLIN/SULBACTAM <=2 SENSITIVE Sensitive     * ACINETOBACTER CALCOACETICUS/BAUMANNII COMPLEX  Blood Culture ID Panel (Reflexed)     Status: Abnormal   Collection Time: 04/25/21  4:51 PM  Result Value Ref Range Status   Enterococcus faecalis NOT DETECTED NOT DETECTED Final   Enterococcus Faecium NOT DETECTED NOT DETECTED Final   Listeria monocytogenes NOT DETECTED NOT DETECTED Final   Staphylococcus species NOT DETECTED NOT DETECTED Final   Staphylococcus aureus (BCID) NOT DETECTED NOT DETECTED Final   Staphylococcus epidermidis NOT DETECTED NOT DETECTED Final   Staphylococcus lugdunensis NOT DETECTED NOT DETECTED Final   Streptococcus species NOT DETECTED NOT DETECTED Final   Streptococcus agalactiae NOT DETECTED NOT DETECTED Final   Streptococcus pneumoniae NOT DETECTED NOT DETECTED Final   Streptococcus pyogenes NOT DETECTED NOT DETECTED Final   A.calcoaceticus-baumannii DETECTED (A) NOT DETECTED Final    Comment: CRITICAL RESULT CALLED TO, READ BACK BY AND VERIFIED WITH: CHARLES SHANLEVER AT 1014 04/26/21.PMF    Bacteroides fragilis NOT DETECTED NOT DETECTED Final   Enterobacterales NOT DETECTED NOT DETECTED Final   Enterobacter cloacae complex NOT DETECTED NOT DETECTED Final   Escherichia coli NOT DETECTED NOT DETECTED Final   Klebsiella aerogenes NOT DETECTED NOT DETECTED Final   Klebsiella oxytoca NOT DETECTED NOT DETECTED Final   Klebsiella pneumoniae NOT DETECTED NOT DETECTED Final   Proteus species NOT DETECTED NOT DETECTED Final   Salmonella species NOT DETECTED NOT DETECTED Final   Serratia marcescens  NOT DETECTED NOT DETECTED Final   Haemophilus influenzae NOT DETECTED NOT DETECTED Final   Neisseria meningitidis NOT DETECTED NOT DETECTED Final   Pseudomonas aeruginosa NOT DETECTED NOT DETECTED Final   Stenotrophomonas maltophilia NOT DETECTED NOT DETECTED Final   Candida albicans NOT DETECTED NOT DETECTED Final   Candida auris NOT DETECTED NOT DETECTED Final   Candida glabrata NOT DETECTED NOT DETECTED Final   Candida krusei NOT DETECTED NOT DETECTED Final   Candida parapsilosis NOT DETECTED NOT DETECTED Final   Candida tropicalis NOT DETECTED NOT DETECTED Final   Cryptococcus neoformans/gattii NOT DETECTED NOT DETECTED Final   CTX-M ESBL NOT DETECTED NOT DETECTED Final   Carbapenem resistance IMP NOT DETECTED  NOT DETECTED Final   Carbapenem resistance KPC NOT DETECTED NOT DETECTED Final   Carbapenem resistance NDM NOT DETECTED NOT DETECTED Final   Carbapenem resistance VIM NOT DETECTED NOT DETECTED Final    Comment: Performed at [facility], [address] [place], [place] [address]  Culture, blood (Routine x 2)     Status: Abnormal   Collection Time: 04/25/21  6:38 PM   Specimen: BLOOD  Result Value Ref Range Status   Specimen Description   Final    BLOOD BLOOD LEFT WRIST Performed at [facility], [address] [place], [place] [address]    Special Requests   Final    BOTTLES DRAWN AEROBIC AND ANAEROBIC Blood Culture adequate volume Performed at [facility], [address] [place], [place] [address]    Culture  Setup Time   Final    GRAM VARIABLE COCCOBACILLI AEROBIC BOTTLE ONLY CRITICAL VALUE NOTED.  VALUE IS CONSISTENT WITH PREVIOUSLY REPORTED AND CALLED VALUE. Performed at [facility], [address] [place], [place] [address]    Culture (A)  Final    ACINETOBACTER CALCOACETICUS/BAUMANNII COMPLEX SUSCEPTIBILITIES PERFORMED ON PREVIOUS CULTURE WITHIN THE LAST 5 DAYS. Performed at [facility], [place] [address],  [place], [place] [address]    Report Status 04/28/2021 FINAL  Final  Resp Panel by RT-PCR (Flu A&B, Covid) Nasopharyngeal Swab     Status: None   Collection Time: 04/25/21  6:54 PM   Specimen: Nasopharyngeal Swab; Nasopharyngeal(NP) swabs in vial transport medium  Result Value Ref Range Status   SARS Coronavirus 2 by RT PCR NEGATIVE NEGATIVE Final    Comment: (NOTE) SARS-CoV-2 target nucleic acids are NOT DETECTED.  The SARS-CoV-2 RNA is generally detectable in upper respiratory specimens during the acute phase of infection. The lowest concentration of SARS-CoV-2 viral copies this assay can detect is 138 copies/mL. A negative result does not preclude SARS-Cov-2 infection and should not be used as the sole basis for treatment or other patient management decisions. A negative result may occur with  improper specimen collection/handling, submission of specimen other than nasopharyngeal swab, presence of viral mutation(s) within the areas targeted by this assay, and inadequate number of viral copies(<138 copies/mL). A negative result must be combined with clinical observations, patient history, and epidemiological information. The expected result is Negative.  Fact Sheet for Patients:  EntrepreneurPulse.com.au  Fact Sheet for Healthcare Providers:  IncredibleEmployment.be  This test is no t yet approved or cleared by the [place] FDA and  has been authorized for detection and/or diagnosis of SARS-CoV-2 by FDA under an Emergency Use Authorization (EUA). This EUA will remain  in effect (meaning this test can be used) for the duration of the COVID-19 declaration under Section 564(b)(1) of the Act, 21 U.S.C.section 360bbb-3(b)(1), unless the authorization is terminated  or revoked sooner.       Influenza A by PCR NEGATIVE NEGATIVE Final   Influenza B by PCR NEGATIVE NEGATIVE Final    Comment: (NOTE) The Xpert Xpress SARS-CoV-2/FLU/RSV plus assay is  intended as an aid in the diagnosis of influenza from Nasopharyngeal swab specimens and should not be used as a sole basis for treatment. Nasal washings and aspirates are unacceptable for Xpert Xpress SARS-CoV-2/FLU/RSV testing.  Fact Sheet for Patients: EntrepreneurPulse.com.au  Fact Sheet for Healthcare Providers: IncredibleEmployment.be  This test is not yet approved or cleared by the [place] FDA and has been authorized for detection and/or diagnosis of SARS-CoV-2 by FDA under an Emergency Use  Authorization (EUA). This EUA will remain in effect (meaning this test can be used) for the duration of the COVID-19 declaration under Section 564(b)(1) of the Act, 21 U.S.C. section 360bbb-3(b)(1), unless the authorization is terminated or revoked.  Performed at [facility], [address] [place], [place] [address]   MRSA Next Gen by PCR, Nasal     Status: None   Collection Time: 04/26/21  6:35 PM   Specimen: Nasal Mucosa; Nasal Swab  Result Value Ref Range Status   MRSA by PCR Next Gen NOT DETECTED NOT DETECTED Final    Comment: (NOTE) The GeneXpert MRSA Assay (FDA approved for NASAL specimens only), is one component of a comprehensive MRSA colonization surveillance program. It is not intended to diagnose MRSA infection nor to guide or monitor treatment for MRSA infections. Test performance is not FDA approved in patients less than 41 years old. Performed at [facility], [address], [place], [place] [address]   Cath Tip Culture     Status: None   Collection Time: 04/27/21 11:59 AM   Specimen: Catheter Tip; Other  Result Value Ref Range Status   Specimen Description   Final    CATH TIP Performed at [facility], [address] [place], [place] [address]    Special Requests   Final    NONE Performed at [facility], [address] [place], [place] [address]    Culture   Final    NO  GROWTH Performed at [facility], [address] [address], [place], [place] [address]    Report Status 05/01/2021 FINAL  Final  CULTURE, BLOOD (ROUTINE X 2) w Reflex to ID Panel     Status: None   Collection Time: 04/28/21 11:34 AM   Specimen: BLOOD  Result Value Ref Range Status   Specimen Description BLOOD RIGHT AC  Final   Special Requests   Final    BOTTLES DRAWN AEROBIC ONLY Blood Culture results may not be optimal due to an inadequate volume of blood received in culture bottles   Culture   Final    NO GROWTH 5 DAYS Performed at [facility], [address] [place], [place] [address]    Report Status 05/03/2021 FINAL  Final  CULTURE, BLOOD (ROUTINE X 2) w Reflex to ID Panel     Status: None   Collection Time: 04/28/21  5:27 PM   Specimen: BLOOD  Result Value Ref Range Status   Specimen Description BLOOD LT FINGER IN HAND  Final   Special Requests BOTTLES DRAWN AEROBIC ONLY  BCAV  Final   Culture   Final    NO GROWTH 5 DAYS Performed at [facility], [address], [place],  [address]    Report Status 05/03/2021 FINAL  Final    Coagulation Studies: No results for input(s): LABPROT, INR in the last 72 hours.   Urinalysis: No results for input(s): COLORURINE, LABSPEC, PHURINE, GLUCOSEU, HGBUR, BILIRUBINUR, KETONESUR, PROTEINUR, UROBILINOGEN, NITRITE, LEUKOCYTESUR in the last 72 hours.  Invalid input(s): APPERANCEUR    Imaging: No results found.   Medications:    ampicillin-sulbactam (UNASYN) IV 3 g (05/05/21 0930)    vitamin C  500 mg Oral BID   atorvastatin  20 mg Per Tube QPM   chlorhexidine  15 mL Mouth Rinse BID   Chlorhexidine Gluconate Cloth  6 each Topical Q0600   cholecalciferol  1,000 Units Per Tube Daily   feeding supplement (NEPRO CARB STEADY)  237 mL Oral  BID BM   fentaNYL  1 patch Transdermal Q72H   heparin  5,000 Units Subcutaneous Q8H   lipase/protease/amylase  36,000 Units Oral TID AC   loperamide  2 mg Oral BID    mouth rinse  15 mL Mouth Rinse q12n4p   midodrine  10 mg Per Tube TID WC   mirtazapine  7.5 mg Per Tube QHS   multivitamin  1 tablet Per Tube QHS   nutrition supplement (JUVEN)  1 packet Per Tube BID BM   QUEtiapine  25 mg Per Tube BID   silver sulfADIAZINE   Topical BID   cyanocobalamin  1,000 mcg Per Tube Daily   acetaminophen **OR** acetaminophen, albuterol, dextrose, diphenhydrAMINE, docusate sodium, fentaNYL (SUBLIMAZE) injection, heparin sodium (porcine), oxyCODONE-acetaminophen, polyvinyl alcohol  Assessment/ Plan:  [name] is a 60 y.o. black female with end stage renal disease on hemodialysis for greater than 14 years, hypertension, congestive heart failure, coronary artery disease, diabetes mellitus type II, who is admitted to [facility] on 04/25/2021 for Sepsis ([place]) [A41.9] Severe sepsis ([place]) [A41.9, R65.20] Fever in other diseases [R50.81]   [facility] Perm[facility]ath 74kg      1)Renal    Patient has end-stage renal disease Patient at the time of admission was on pressors and required CRRT.  Patient CRRT was DC'd on August 8. Patient has been on intermittent dialysis now via temporary catheter Patient was last refused to be dialyzed yesterday. Because of patient's fluid situation and the plan was to dialyze patient today but patient refused to go for dialysis today.  Educated patient at length that why she is being dialyzed today again but patient continues to state that she does not want to go for another dialysis session. I then also discussed with the patient regarding her access.  Educated patient that she has a temporary access and patient cannot be discharged at this access she will rather need a tunneled catheter.  I did inform the patient that vascular surgery will discuss more about these options   2)HTN    Blood pressure is stable for the acute state. Renal replacement therapy/fluid removal should help We will follow   3)Anemia of chronic  disease  CBC Latest Ref Rng & Units 05/01/2021 04/30/2021 04/29/2021  WBC 4.0 - 10.5 K/uL 6.0 7.0 -  Hemoglobin 12.0 - 15.0 g/dL 9.8(L) 9.2(L) 8.6(L)  Hematocrit 36.0 - 46.0 % 30.7(L) 29.2(L) 26.6(L)  Platelets 150 - 400 K/uL 123(L) 107(L) -       HGb at goal (9--11)   4) Secondary hyperparathyroidism -CKD Mineral-Bone Disorder    Lab Results  Component Value Date   PTH 357 (H) 10/05/2018   CALCIUM 8.4 (L) 05/05/2021   CAION 1.09 (L) 04/13/2020   PHOS 4.0 05/05/2021    Secondary Hyperparathyroidism present Phosphorus is not at goal. Patient binders are currently on hold  5) social/palliative care Palliative care consulted and [place] discussions underway. Patient states that son is not her HCPOA, but she wants his acceptance of her decision for Hospice.    6) Electrolytes   BMP Latest Ref Rng & Units 05/05/2021 05/02/2021 05/01/2021  Glucose 70 - 99 mg/dL 112(H) 161(H) 193(H)  BUN 8 - 23 mg/dL 36(H) 14 13  Creatinine 0.44 - 1.00 mg/dL 3.22(H) 1.80(H) 1.87(H)  Sodium 135 - 145 mmol/L 133(L) 135 134(L)  Potassium 3.5 - 5.1 mmol/L 4.3 3.9 3.6  Chloride 98 - 111 mmol/L 96(L) 98 97(L)  CO2 22 - 32 mmol/L 29 32 27  Calcium 8.9 - 10.3 mg/dL 8.4(L) 8.1(L) 8.3(L)     Sodium Hyponatremia Patient sodium is low secondary to ESRD   Potassium Normokalemic    7)Acid base   Co2 at goal  8)Sepsis Patient was admitted with the sepsis Patient tunneled catheter has since been removed and a temporary catheter was placed   Plan  We will dialyze in morning We will continue to follow   LOS: 10 Henok Heacock s [facility] 8/14/202210:25 AM

## 2021-05-05 NOTE — Progress Notes (Signed)
PROGRESS NOTE    [name]  DPO:[id number] DOB: [birth year] DOA: 04/25/2021 PCP: Denton Lank, MD    Brief Narrative:  [name] is a 60 y.o. female with medical history significant for end-stage renal disease on hemodialysis on Monday, Wednesday, Friday schedule, diabetes mellitus complicated by peripheral polyneuropathy and gastroparesis,, hyperlipidemia, anemia of chronic kidney disease with baseline hemoglobin 8-10, hypertension, who is admitted to [facility] on 04/25/2021 with severe sepsis after presenting from home to [facility] ED complaining of fever.  Patient was diagnosed with septic shock, blood culture positive for Acinetobacter calcoaceticus.  This appears to be secondary to dialysis catheter infection.  Patient also developed acute hypoxemic respiratory failure required mechanical ventilation. Permacath was removed on 8/06.  Patient treated with Unasyn, last dose 8/20. Transfer out of ICU on 8/9.    Assessment & Plan:   Principal Problem:   Severe sepsis with septic shock (HCC) Active Problems:   Diabetes mellitus, type II ([place])   Hypoglycemia associated with diabetes ([place])   Generalized weakness   Acute hypoxemic respiratory failure (HCC)   Pressure ulcer, stage 2 (HCC)   ESRD (end stage renal disease) (HCC)   GERD (gastroesophageal reflux disease)   Acute metabolic encephalopathy   HCAP (healthcare-associated pneumonia)   Anemia of chronic kidney failure   Thrombocytopenia ([place])  #1.  Septic shock with Acinetobacter septicemia secondary to dialysis catheter infection. Acute hypoxemic respite failure secondary to septic shock. Permacath infection Patient is still on Unasyn, last dose 8/20.  Patient is developed some loose stools, will give Imodium.     2.  End-stage renal disease. Hypophosphatemia. Hyponatremia. Permacath is tentatively scheduled for Monday.  Looks like this patient still want it.   DVT prophylaxis: Heparin Code Status:  DNR Family Communication:  Disposition Plan:    Status is: Inpatient  Remains inpatient appropriate because:Inpatient level of care appropriate due to severity of illness  Dispo: The patient is from: Home              Anticipated d/c is to: Home              Patient currently is not medically stable to d/c.   Difficult to place patient No        I/O last 3 completed shifts: In: 800 [P.O.:300; IV Piggyback:500] Out: 2 [Stool:2] No intake/output data recorded.     Consultants:  Nephrology  Procedures: HD  Antimicrobials: Unasyn   Subjective: Patient doing well today, she slept well last night.  She does not have confusion today.  She denies any short of breath or cough. She has some loose stools but no abdominal pain or nausea vomiting. Denies any fever or chills.  Objective: Vitals:   05/04/21 0810 05/04/21 1958 05/05/21 0607 05/05/21 0750  BP: (!) 163/67 (!) 170/67 (!) 138/49 (!) 158/68  Pulse: 77 73 70 71  Resp: 18 20 16 12   Temp: 97.9 F (36.6 C) 98.8 F (37.1 C) 98.1 F (36.7 C) 98.1 F (36.7 C)  TempSrc: Oral Oral Oral Oral  SpO2: 100% 100% 100% 100%  Weight:      Height:        Intake/Output Summary (Last 24 hours) at 05/05/2021 1313 Last data filed at 05/04/2021 1758 Gross per 24 hour  Intake --  Output 1 ml  Net -1 ml   Filed Weights   04/29/21 0454 04/30/21 0500 05/01/21 0500  Weight: 83.1 kg 80.6 kg 75.5 kg    Examination:  General exam:  Appears calm and comfortable  Respiratory system: Clear to auscultation. Respiratory effort normal. Cardiovascular system: S1 & S2 heard, RRR. No JVD, murmurs, rubs, gallops or clicks. No pedal edema. Gastrointestinal system: Abdomen is nondistended, soft and nontender. No organomegaly or masses felt. Normal bowel sounds heard. Central nervous system: Alert and oriented x3.  No focal neurological deficits. Extremities: Symmetric 5 x 5 power. Skin: No rashes, lesions or ulcers Psychiatry: Judgement  and insight appear normal. Mood & affect appropriate.     Data Reviewed: I have personally reviewed following labs and imaging studies  CBC: Recent Labs  Lab 04/28/21 1718 04/29/21 0411 04/29/21 1318 04/30/21 0512 05/01/21 0433  WBC  --  6.2  --  7.0 6.0  HGB 7.5* 7.1* 8.6* 9.2* 9.8*  HCT 23.7* 21.9* 26.6* 29.2* 30.7*  MCV  --  90.1  --  88.5 86.2  PLT  --  115*  --  107* 629*   Basic Metabolic Panel: Recent Labs  Lab 04/29/21 0411 04/30/21 0512 05/01/21 0433 05/02/21 0540 05/05/21 0607  NA 132* 133* 134* 135 133*  K 3.1* 3.8 3.6 3.9 4.3  CL 101 99 97* 98 96*  CO2 28 26 27  32 29  GLUCOSE 145* 100* 193* 161* 112*  BUN 6* 10 13 14  36*  CREATININE 1.10* 1.59* 1.87* 1.80* 3.22*  CALCIUM 8.3* 8.2* 8.3* 8.1* 8.4*  MG 1.9  --  2.3 2.1 2.2  PHOS 2.0* 2.2* 1.8* 1.9* 4.0   GFR: Estimated Creatinine Clearance: 18.7 mL/min (A) (by C-G formula based on SCr of 3.22 mg/dL (H)). Liver Function Tests: Recent Labs  Lab 04/29/21 0411 04/30/21 0512 05/01/21 0433 05/02/21 0540 05/05/21 0607  AST 16  --   --   --   --   ALT 13  --   --   --   --   ALKPHOS 140*  --   --   --   --   BILITOT 0.6  --   --   --   --   PROT 6.0*  --   --   --   --   ALBUMIN 2.9*  2.6* 2.4* 2.6* 2.6* 2.3*   No results for input(s): LIPASE, AMYLASE in the last 168 hours. No results for input(s): AMMONIA in the last 168 hours. Coagulation Profile: No results for input(s): INR, PROTIME in the last 168 hours. Cardiac Enzymes: No results for input(s): CKTOTAL, CKMB, CKMBINDEX, TROPONINI in the last 168 hours. BNP (last 3 results) No results for input(s): PROBNP in the last 8760 hours. HbA1C: No results for input(s): HGBA1C in the last 72 hours. CBG: Recent Labs  Lab 05/04/21 2115 05/05/21 0256 05/05/21 0540 05/05/21 0747 05/05/21 1134  GLUCAP 94 124* 109* 133* 86   Lipid Profile: No results for input(s): CHOL, HDL, LDLCALC, TRIG, CHOLHDL, LDLDIRECT in the last 72 hours. Thyroid Function  Tests: No results for input(s): TSH, T4TOTAL, FREET4, T3FREE, THYROIDAB in the last 72 hours. Anemia Panel: No results for input(s): VITAMINB12, FOLATE, FERRITIN, TIBC, IRON, RETICCTPCT in the last 72 hours. Sepsis Labs: No results for input(s): PROCALCITON, LATICACIDVEN in the last 168 hours.  Recent Results (from the past 240 hour(s))  Culture, blood (Routine x 2)     Status: Abnormal   Collection Time: 04/25/21  4:51 PM   Specimen: BLOOD  Result Value Ref Range Status   Specimen Description   Final    BLOOD LW Performed at [facility], [address], [place], [place] [address]  Special Requests   Final    BOTTLES DRAWN AEROBIC AND ANAEROBIC Blood Culture adequate volume Performed at [facility], [address] [place], [place] [address]    Culture  Setup Time   Final    GRAM VARIABLE COCCOBACILLI AEROBIC BOTTLE ONLY Organism ID to follow CRITICAL RESULT CALLED TO, READ BACK BY AND VERIFIED WITH: [facility] AT 1014 04/26/21.PMF Performed at [facility], [address] [place], [place] [address]    Culture ACINETOBACTER CALCOACETICUS/BAUMANNII COMPLEX (A)  Final   Report Status 04/28/2021 FINAL  Final   Organism ID, Bacteria ACINETOBACTER CALCOACETICUS/BAUMANNII COMPLEX  Final      Susceptibility   Acinetobacter calcoaceticus/baumannii complex - MIC*    CEFTAZIDIME 16 INTERMEDIATE Intermediate     CIPROFLOXACIN <=0.25 SENSITIVE Sensitive     GENTAMICIN <=1 SENSITIVE Sensitive     IMIPENEM <=0.25 SENSITIVE Sensitive     PIP/TAZO 32 INTERMEDIATE Intermediate     TRIMETH/SULFA <=20 SENSITIVE Sensitive     AMPICILLIN/SULBACTAM <=2 SENSITIVE Sensitive     * ACINETOBACTER CALCOACETICUS/BAUMANNII COMPLEX  Blood Culture ID Panel (Reflexed)     Status: Abnormal   Collection Time: 04/25/21  4:51 PM  Result Value Ref Range Status   Enterococcus faecalis NOT DETECTED NOT DETECTED Final   Enterococcus Faecium NOT DETECTED NOT DETECTED Final    Listeria monocytogenes NOT DETECTED NOT DETECTED Final   Staphylococcus species NOT DETECTED NOT DETECTED Final   Staphylococcus aureus (BCID) NOT DETECTED NOT DETECTED Final   Staphylococcus epidermidis NOT DETECTED NOT DETECTED Final   Staphylococcus lugdunensis NOT DETECTED NOT DETECTED Final   Streptococcus species NOT DETECTED NOT DETECTED Final   Streptococcus agalactiae NOT DETECTED NOT DETECTED Final   Streptococcus pneumoniae NOT DETECTED NOT DETECTED Final   Streptococcus pyogenes NOT DETECTED NOT DETECTED Final   A.calcoaceticus-baumannii DETECTED (A) NOT DETECTED Final    Comment: CRITICAL RESULT CALLED TO, READ BACK BY AND VERIFIED WITH: CHARLES SHANLEVER AT 1014 04/26/21.PMF    Bacteroides fragilis NOT DETECTED NOT DETECTED Final   Enterobacterales NOT DETECTED NOT DETECTED Final   Enterobacter cloacae complex NOT DETECTED NOT DETECTED Final   Escherichia coli NOT DETECTED NOT DETECTED Final   Klebsiella aerogenes NOT DETECTED NOT DETECTED Final   Klebsiella oxytoca NOT DETECTED NOT DETECTED Final   Klebsiella pneumoniae NOT DETECTED NOT DETECTED Final   Proteus species NOT DETECTED NOT DETECTED Final   Salmonella species NOT DETECTED NOT DETECTED Final   Serratia marcescens NOT DETECTED NOT DETECTED Final   Haemophilus influenzae NOT DETECTED NOT DETECTED Final   Neisseria meningitidis NOT DETECTED NOT DETECTED Final   Pseudomonas aeruginosa NOT DETECTED NOT DETECTED Final   Stenotrophomonas maltophilia NOT DETECTED NOT DETECTED Final   Candida albicans NOT DETECTED NOT DETECTED Final   Candida auris NOT DETECTED NOT DETECTED Final   Candida glabrata NOT DETECTED NOT DETECTED Final   Candida krusei NOT DETECTED NOT DETECTED Final   Candida parapsilosis NOT DETECTED NOT DETECTED Final   Candida tropicalis NOT DETECTED NOT DETECTED Final   Cryptococcus neoformans/gattii NOT DETECTED NOT DETECTED Final   CTX-M ESBL NOT DETECTED NOT DETECTED Final   Carbapenem  resistance IMP NOT DETECTED NOT DETECTED Final   Carbapenem resistance KPC NOT DETECTED NOT DETECTED Final   Carbapenem resistance NDM NOT DETECTED NOT DETECTED Final   Carbapenem resistance VIM NOT DETECTED NOT DETECTED Final    Comment: Performed at [facility], [address] [place], [place] [address]  Culture, blood (Routine x 2)  Status: Abnormal   Collection Time: 04/25/21  6:38 PM   Specimen: BLOOD  Result Value Ref Range Status   Specimen Description   Final    BLOOD BLOOD LEFT WRIST Performed at [facility], [address] [place], [place] [address]    Special Requests   Final    BOTTLES DRAWN AEROBIC AND ANAEROBIC Blood Culture adequate volume Performed at [facility], [address] [place], [place] [address]    Culture  Setup Time   Final    GRAM VARIABLE COCCOBACILLI AEROBIC BOTTLE ONLY CRITICAL VALUE NOTED.  VALUE IS CONSISTENT WITH PREVIOUSLY REPORTED AND CALLED VALUE. Performed at [facility], [address] [place], [place] [address]    Culture (A)  Final    ACINETOBACTER CALCOACETICUS/BAUMANNII COMPLEX SUSCEPTIBILITIES PERFORMED ON PREVIOUS CULTURE WITHIN THE LAST 5 DAYS. Performed at [facility], [place] [address] [place], [place] [address]    Report Status 04/28/2021 FINAL  Final  Resp Panel by RT-PCR (Flu A&B, Covid) Nasopharyngeal Swab     Status: None   Collection Time: 04/25/21  6:54 PM   Specimen: Nasopharyngeal Swab; Nasopharyngeal(NP) swabs in vial transport medium  Result Value Ref Range Status   SARS Coronavirus 2 by RT PCR NEGATIVE NEGATIVE Final    Comment: (NOTE) SARS-CoV-2 target nucleic acids are NOT DETECTED.  The SARS-CoV-2 RNA is generally detectable in upper respiratory specimens during the acute phase of infection. The lowest concentration of SARS-CoV-2 viral copies this assay can detect is 138 copies/mL. A negative result does not preclude SARS-Cov-2 infection and should not be  used as the sole basis for treatment or other patient management decisions. A negative result may occur with  improper specimen collection/handling, submission of specimen other than nasopharyngeal swab, presence of viral mutation(s) within the areas targeted by this assay, and inadequate number of viral copies(<138 copies/mL). A negative result must be combined with clinical observations, patient history, and epidemiological information. The expected result is Negative.  Fact Sheet for Patients:  EntrepreneurPulse.com.au  Fact Sheet for Healthcare Providers:  IncredibleEmployment.be  This test is no t yet approved or cleared by the [place] FDA and  has been authorized for detection and/or diagnosis of SARS-CoV-2 by FDA under an Emergency Use Authorization (EUA). This EUA will remain  in effect (meaning this test can be used) for the duration of the COVID-19 declaration under Section 564(b)(1) of the Act, 21 U.S.C.section 360bbb-3(b)(1), unless the authorization is terminated  or revoked sooner.       Influenza A by PCR NEGATIVE NEGATIVE Final   Influenza B by PCR NEGATIVE NEGATIVE Final    Comment: (NOTE) The Xpert Xpress SARS-CoV-2/FLU/RSV plus assay is intended as an aid in the diagnosis of influenza from Nasopharyngeal swab specimens and should not be used as a sole basis for treatment. Nasal washings and aspirates are unacceptable for Xpert Xpress SARS-CoV-2/FLU/RSV testing.  Fact Sheet for Patients: EntrepreneurPulse.com.au  Fact Sheet for Healthcare Providers: IncredibleEmployment.be  This test is not yet approved or cleared by the [place] FDA and has been authorized for detection and/or diagnosis of SARS-CoV-2 by FDA under an Emergency Use Authorization (EUA). This EUA will remain in effect (meaning this test can be used) for the duration of the COVID-19 declaration under Section  564(b)(1) of the Act, 21 U.S.C. section 360bbb-3(b)(1), unless the authorization is terminated or revoked.  Performed at [facility], [address] [place], [place] [address]   MRSA Next Gen by PCR, Nasal  Status: None   Collection Time: 04/26/21  6:35 PM   Specimen: Nasal Mucosa; Nasal Swab  Result Value Ref Range Status   MRSA by PCR Next Gen NOT DETECTED NOT DETECTED Final    Comment: (NOTE) The GeneXpert MRSA Assay (FDA approved for NASAL specimens only), is one component of a comprehensive MRSA colonization surveillance program. It is not intended to diagnose MRSA infection nor to guide or monitor treatment for MRSA infections. Test performance is not FDA approved in patients less than 62 years old. Performed at [facility], [address] [place], [place] [address]   Cath Tip Culture     Status: None   Collection Time: 04/27/21 11:59 AM   Specimen: Catheter Tip; Other  Result Value Ref Range Status   Specimen Description   Final    CATH TIP Performed at [facility], [address] [place], [place] [address]    Special Requests   Final    NONE Performed at [facility], [address], [place], [place] [address]    Culture   Final    NO GROWTH Performed at [facility], [place] [address], [place], [place] [address]    Report Status 05/01/2021 FINAL  Final  CULTURE, BLOOD (ROUTINE X 2) w Reflex to ID Panel     Status: None   Collection Time: 04/28/21 11:34 AM   Specimen: BLOOD  Result Value Ref Range Status   Specimen Description BLOOD RIGHT AC  Final   Special Requests   Final    BOTTLES DRAWN AEROBIC ONLY Blood Culture results may not be optimal due to an inadequate volume of blood received in culture bottles   Culture   Final    NO GROWTH 5 DAYS Performed at [facility], [address] [place], [place] [address]    Report Status 05/03/2021 FINAL  Final  CULTURE, BLOOD (ROUTINE X 2) w Reflex to ID  Panel     Status: None   Collection Time: 04/28/21  5:27 PM   Specimen: BLOOD  Result Value Ref Range Status   Specimen Description BLOOD LT FINGER IN HAND  Final   Special Requests BOTTLES DRAWN AEROBIC ONLY  BCAV  Final   Culture   Final    NO GROWTH 5 DAYS Performed at [facility], [address] [place],  [address]    Report Status 05/03/2021 FINAL  Final         Radiology Studies: No results found.      Scheduled Meds:  vitamin C  500 mg Oral BID   atorvastatin  20 mg Per Tube QPM   chlorhexidine  15 mL Mouth Rinse BID   Chlorhexidine Gluconate Cloth  6 each Topical Q0600   cholecalciferol  1,000 Units Per Tube Daily   feeding supplement (NEPRO CARB STEADY)  237 mL Oral BID BM   fentaNYL  1 patch Transdermal Q72H   heparin  5,000 Units Subcutaneous Q8H   lipase/protease/amylase  36,000 Units Oral TID AC   loperamide  2 mg Oral BID   mouth rinse  15 mL Mouth Rinse q12n4p   midodrine  10 mg Per Tube TID WC   mirtazapine  7.5 mg Per Tube QHS   multivitamin  1 tablet Per Tube QHS   nutrition supplement (JUVEN)  1 packet Per Tube BID BM   QUEtiapine  25 mg Per Tube BID   silver sulfADIAZINE   Topical BID   cyanocobalamin  1,000 mcg  Per Tube Daily   Continuous Infusions:  ampicillin-sulbactam (UNASYN) IV 3 g (05/05/21 0930)     LOS: 10 days    Time spent: 24 minutes    Sharen Hones, MD [facility]   To contact the attending provider between 7A-7P or the covering provider during after hours 7P-7A, please log into the web site [email or web address] and access using universal [place] password for that web site. If you do not have the password, please call the hospital operator.  05/05/2021, 1:13 PM

## 2021-05-05 NOTE — TOC Progression Note (Signed)
Transition of Care [facility]) - Progression Note    Patient Details  Name: [name] MRN: [id number] Date of Birth: [birth year]  Transition of Care [facility]) CM/SW [place], LCSW Phone Number: 05/05/2021, 12:00 PM  Clinical Narrative:   TOC continues to follow for patient and family to decide plan of care for patient. Per rounds, son to decide on Monday as patient would like him to decide. Palliative Care is involved.          Expected Discharge Plan and Services                                                 Social Determinants of Health (SDOH) Interventions    Readmission Risk Interventions Readmission Risk Prevention Plan 05/03/2021 12/11/2020 09/21/2020  Transportation Screening Complete Complete Complete  Medication Review Press photographer) Complete Complete Complete  PCP or Specialist appointment within 3-5 days of discharge - Complete Complete  HRI or Home Care Consult - Complete Complete  SW Recovery Care/Counseling Consult - Complete Complete  Palliative Care Screening Complete Not Applicable Complete  Skilled Nursing Facility - Not Complete Complete  SNF Comments - working on placement -  Some recent data might be hidden

## 2021-05-06 ENCOUNTER — Inpatient Hospital Stay: Payer: Self-pay | Admitting: Anesthesiology

## 2021-05-06 ENCOUNTER — Encounter: Payer: Self-pay | Admitting: Anesthesiology

## 2021-05-06 LAB — RENAL FUNCTION PANEL
Albumin: 2.5 g/dL — ABNORMAL LOW (ref 3.5–5.0)
Anion gap: 10 (ref 5–15)
BUN: 46 mg/dL — ABNORMAL HIGH (ref 8–23)
CO2: 28 mmol/L (ref 22–32)
Calcium: 8.4 mg/dL — ABNORMAL LOW (ref 8.9–10.3)
Chloride: 96 mmol/L — ABNORMAL LOW (ref 98–111)
Creatinine, Ser: 4.04 mg/dL — ABNORMAL HIGH (ref 0.44–1.00)
GFR, Estimated: 12 mL/min — ABNORMAL LOW (ref >60)
Glucose, Bld: 141 mg/dL — ABNORMAL HIGH (ref 70–99)
Phosphorus: 4.9 mg/dL — ABNORMAL HIGH (ref 2.5–4.6)
Potassium: 4.7 mmol/L (ref 3.5–5.1)
Sodium: 134 mmol/L — ABNORMAL LOW (ref 135–145)

## 2021-05-06 LAB — HEPATITIS B SURFACE ANTIGEN: Hepatitis B Surface Ag: NONREACTIVE

## 2021-05-06 LAB — GLUCOSE, CAPILLARY
Glucose-Capillary: 109 mg/dL — ABNORMAL HIGH (ref 70–99)
Glucose-Capillary: 127 mg/dL — ABNORMAL HIGH (ref 70–99)
Glucose-Capillary: 139 mg/dL — ABNORMAL HIGH (ref 70–99)
Glucose-Capillary: 145 mg/dL — ABNORMAL HIGH (ref 70–99)
Glucose-Capillary: 95 mg/dL (ref 70–99)

## 2021-05-06 LAB — MAGNESIUM: Magnesium: 2.2 mg/dL (ref 1.7–2.4)

## 2021-05-06 NOTE — Progress Notes (Signed)
[facility]  ROUNDING NOTE   Subjective:   [name] was admitted to [facility] on 04/25/2021 for Sepsis [facility]) [A41.9] Severe sepsis ([place]) [A41.9, R65.20] Fever in other diseases [R50.81]  Extubated 04/28/2021 Patient seen sitting up in bed Eating breakfast Voicing frustration with family not wanting to honor her health care wishes. States she is tired and been through a lot. States she would be great if she had a full healthcare team to take home with her. Says her son has told her in the past that he couldn't do any since he helps take care of her. Her grandchild is scared to come near her with her arm being swollen and scars from dialysis and procedures. She says she knows this will only get worse. She states she does not want any further procedures.   Patient seen during dialysis   HEMODIALYSIS FLOWSHEET:  Blood Flow Rate (mL/min): 400 mL/min Arterial Pressure (mmHg): -160 mmHg Venous Pressure (mmHg): 130 mmHg Transmembrane Pressure (mmHg): 50 mmHg Ultrafiltration Rate (mL/min): 670 mL/min Dialysate Flow Rate (mL/min): 500 ml/min Conductivity: Machine : 13.5 Conductivity: Machine : 13.5 Dialysis Fluid Bolus: Normal Saline Bolus Amount (mL): 300 mL  No complaints at this time States she wishes to have a family meeting to discuss her goals of care    Objective:  Vital signs in last 24 hours:  Temp:  [98 F (36.7 C)-98.4 F (36.9 C)] 98.4 F (36.9 C) (08/15 0800) Pulse Rate:  [69-81] 69 (08/15 1145) Resp:  [10-20] 13 (08/15 1230) BP: (154-176)/(54-79) 163/70 (08/15 1230) SpO2:  [100 %] 100 % (08/15 0800) Weight:  [77.4 kg] 77.4 kg (08/15 0443)  Weight change:  Filed Weights   04/30/21 0500 05/01/21 0500 05/06/21 0443  Weight: 80.6 kg 75.5 kg 77.4 kg    Intake/Output: No intake/output data recorded.   Intake/Output this shift:  No intake/output data recorded.  Physical Exam: General: NAD  Head: Normocephalic, atraumatic. Moist oral mucosal  membranes  Lungs:  Coarse breath sounds, normal breathing effort  Heart: Regular rate and rhythm  Abdomen:  Soft, nontender  Extremities: ++ peripheral edema.  Neurologic: Able to follow commands and answer simple questions  Skin: Bilateral foot wounds  Access: Right femoral temporary dialysis catheter    Basic Metabolic Panel: Recent Labs  Lab 04/30/21 0512 05/01/21 0433 05/02/21 0540 05/05/21 0607 05/06/21 0559  NA 133* 134* 135 133* 134*  K 3.8 3.6 3.9 4.3 4.7  CL 99 97* 98 96* 96*  CO2 26 27 32 29 28  GLUCOSE 100* 193* 161* 112* 141*  BUN 10 13 14  36* 46*  CREATININE 1.59* 1.87* 1.80* 3.22* 4.04*  CALCIUM 8.2* 8.3* 8.1* 8.4* 8.4*  MG  --  2.3 2.1 2.2 2.2  PHOS 2.2* 1.8* 1.9* 4.0 4.9*     Liver Function Tests: Recent Labs  Lab 04/30/21 0512 05/01/21 0433 05/02/21 0540 05/05/21 0607 05/06/21 0559  ALBUMIN 2.4* 2.6* 2.6* 2.3* 2.5*    No results for input(s): LIPASE, AMYLASE in the last 168 hours.  No results for input(s): AMMONIA in the last 168 hours.   CBC: Recent Labs  Lab 04/30/21 0512 05/01/21 0433  WBC 7.0 6.0  HGB 9.2* 9.8*  HCT 29.2* 30.7*  MCV 88.5 86.2  PLT 107* 123*     Cardiac Enzymes: No results for input(s): CKTOTAL, CKMB, CKMBINDEX, TROPONINI in the last 168 hours.  BNP: Invalid input(s): POCBNP  CBG: Recent Labs  Lab 05/05/21 0747 05/05/21 1134 05/05/21 2051 05/06/21 0443 05/06/21  [place]     Microbiology: Results for orders placed or performed during the hospital encounter of 04/25/21  Culture, blood (Routine x 2)     Status: Abnormal   Collection Time: 04/25/21  4:51 PM   Specimen: BLOOD  Result Value Ref Range Status   Specimen Description   Final    BLOOD LW Performed at [facility], [address], [place], [place] [address]    Special Requests   Final    BOTTLES DRAWN AEROBIC AND ANAEROBIC Blood Culture adequate volume Performed at [facility], [address] [place], [place] [address]    Culture  Setup Time   Final    GRAM VARIABLE COCCOBACILLI AEROBIC BOTTLE ONLY Organism ID to follow CRITICAL RESULT CALLED TO, READ BACK BY AND VERIFIED WITH: [facility] AT 0981 04/26/21.PMF Performed at [facility], [address] [place], [place] [address]    Culture ACINETOBACTER CALCOACETICUS/BAUMANNII COMPLEX (A)  Final   Report Status 04/28/2021 FINAL  Final   Organism ID, Bacteria ACINETOBACTER CALCOACETICUS/BAUMANNII COMPLEX  Final      Susceptibility   Acinetobacter calcoaceticus/baumannii complex - MIC*    CEFTAZIDIME 16 INTERMEDIATE Intermediate     CIPROFLOXACIN <=0.25 SENSITIVE Sensitive     GENTAMICIN <=1 SENSITIVE Sensitive     IMIPENEM <=0.25 SENSITIVE Sensitive     PIP/TAZO 32 INTERMEDIATE Intermediate     TRIMETH/SULFA <=20 SENSITIVE Sensitive     AMPICILLIN/SULBACTAM <=2 SENSITIVE Sensitive     * ACINETOBACTER CALCOACETICUS/BAUMANNII COMPLEX  Blood Culture ID Panel (Reflexed)     Status: Abnormal   Collection Time: 04/25/21  4:51 PM  Result Value Ref Range Status   Enterococcus faecalis NOT DETECTED NOT DETECTED Final   Enterococcus Faecium NOT DETECTED NOT DETECTED Final   Listeria monocytogenes NOT DETECTED NOT DETECTED Final   Staphylococcus species NOT DETECTED NOT DETECTED Final   Staphylococcus aureus (BCID) NOT DETECTED NOT DETECTED Final   Staphylococcus epidermidis NOT DETECTED NOT DETECTED Final   Staphylococcus lugdunensis NOT DETECTED NOT DETECTED Final   Streptococcus species NOT DETECTED NOT DETECTED Final   Streptococcus agalactiae NOT DETECTED NOT DETECTED Final   Streptococcus pneumoniae NOT DETECTED NOT DETECTED Final   Streptococcus pyogenes NOT DETECTED NOT DETECTED Final   A.calcoaceticus-baumannii DETECTED (A) NOT DETECTED Final    Comment: CRITICAL RESULT CALLED TO, READ BACK BY AND VERIFIED WITH: CHARLES SHANLEVER AT 1014 04/26/21.PMF    Bacteroides fragilis NOT DETECTED NOT DETECTED  Final   Enterobacterales NOT DETECTED NOT DETECTED Final   Enterobacter cloacae complex NOT DETECTED NOT DETECTED Final   Escherichia coli NOT DETECTED NOT DETECTED Final   Klebsiella aerogenes NOT DETECTED NOT DETECTED Final   Klebsiella oxytoca NOT DETECTED NOT DETECTED Final   Klebsiella pneumoniae NOT DETECTED NOT DETECTED Final   Proteus species NOT DETECTED NOT DETECTED Final   Salmonella species NOT DETECTED NOT DETECTED Final   Serratia marcescens NOT DETECTED NOT DETECTED Final   Haemophilus influenzae NOT DETECTED NOT DETECTED Final   Neisseria meningitidis NOT DETECTED NOT DETECTED Final   Pseudomonas aeruginosa NOT DETECTED NOT DETECTED Final   Stenotrophomonas maltophilia NOT DETECTED NOT DETECTED Final   Candida albicans NOT DETECTED NOT DETECTED Final   Candida auris NOT DETECTED NOT DETECTED Final   Candida glabrata NOT DETECTED NOT DETECTED Final   Candida krusei NOT DETECTED NOT DETECTED Final   Candida parapsilosis NOT DETECTED NOT DETECTED Final   Candida tropicalis NOT DETECTED NOT DETECTED  Final   Cryptococcus neoformans/gattii NOT DETECTED NOT DETECTED Final   CTX-M ESBL NOT DETECTED NOT DETECTED Final   Carbapenem resistance IMP NOT DETECTED NOT DETECTED Final   Carbapenem resistance KPC NOT DETECTED NOT DETECTED Final   Carbapenem resistance NDM NOT DETECTED NOT DETECTED Final   Carbapenem resistance VIM NOT DETECTED NOT DETECTED Final    Comment: Performed at [facility], [address] [place], [place] [address]  Culture, blood (Routine x 2)     Status: Abnormal   Collection Time: 04/25/21  6:38 PM   Specimen: BLOOD  Result Value Ref Range Status   Specimen Description   Final    BLOOD BLOOD LEFT WRIST Performed at [facility], [address] [place], [place] [address]    Special Requests   Final    BOTTLES DRAWN AEROBIC AND ANAEROBIC Blood Culture adequate volume Performed at [facility], [address]  [place], [place] [address]    Culture  Setup Time   Final    GRAM VARIABLE COCCOBACILLI AEROBIC BOTTLE ONLY CRITICAL VALUE NOTED.  VALUE IS CONSISTENT WITH PREVIOUSLY REPORTED AND CALLED VALUE. Performed at [facility], [address] [place], [place] [address]    Culture (A)  Final    ACINETOBACTER CALCOACETICUS/BAUMANNII COMPLEX SUSCEPTIBILITIES PERFORMED ON PREVIOUS CULTURE WITHIN THE LAST 5 DAYS. Performed at [facility], [place] [address] [place],  [address]    Report Status 04/28/2021 FINAL  Final  Resp Panel by RT-PCR (Flu A&B, Covid) Nasopharyngeal Swab     Status: None   Collection Time: 04/25/21  6:54 PM   Specimen: Nasopharyngeal Swab; Nasopharyngeal(NP) swabs in vial transport medium  Result Value Ref Range Status   SARS Coronavirus 2 by RT PCR NEGATIVE NEGATIVE Final    Comment: (NOTE) SARS-CoV-2 target nucleic acids are NOT DETECTED.  The SARS-CoV-2 RNA is generally detectable in upper respiratory specimens during the acute phase of infection. The lowest concentration of SARS-CoV-2 viral copies this assay can detect is 138 copies/mL. A negative result does not preclude SARS-Cov-2 infection and should not be used as the sole basis for treatment or other patient management decisions. A negative result may occur with  improper specimen collection/handling, submission of specimen other than nasopharyngeal swab, presence of viral mutation(s) within the areas targeted by this assay, and inadequate number of viral copies(<138 copies/mL). A negative result must be combined with clinical observations, patient history, and epidemiological information. The expected result is Negative.  Fact Sheet for Patients:  EntrepreneurPulse.com.au  Fact Sheet for Healthcare Providers:  IncredibleEmployment.be  This test is no t yet approved or cleared by the [place] FDA and  has been authorized for detection and/or diagnosis  of SARS-CoV-2 by FDA under an Emergency Use Authorization (EUA). This EUA will remain  in effect (meaning this test can be used) for the duration of the COVID-19 declaration under Section 564(b)(1) of the Act, 21 U.S.C.section 360bbb-3(b)(1), unless the authorization is terminated  or revoked sooner.       Influenza A by PCR NEGATIVE NEGATIVE Final   Influenza B by PCR NEGATIVE NEGATIVE Final    Comment: (NOTE) The Xpert Xpress SARS-CoV-2/FLU/RSV plus assay is intended as an aid in the diagnosis of influenza from Nasopharyngeal swab specimens and should not be used as a sole basis for treatment. Nasal washings and aspirates are unacceptable for Xpert Xpress SARS-CoV-2/FLU/RSV testing.  Fact Sheet for Patients: EntrepreneurPulse.com.au  Fact Sheet for Healthcare Providers: IncredibleEmployment.be  This test is  not yet approved or cleared by the [place] and has been authorized for detection and/or diagnosis of SARS-CoV-2 by FDA under an Emergency Use Authorization (EUA). This EUA will remain in effect (meaning this test can be used) for the duration of the COVID-19 declaration under Section 564(b)(1) of the Act, 21 U.S.C. section 360bbb-3(b)(1), unless the authorization is terminated or revoked.  Performed at [facility], [address] [place], [place] [address]   MRSA Next Gen by PCR, Nasal     Status: None   Collection Time: 04/26/21  6:35 PM   Specimen: Nasal Mucosa; Nasal Swab  Result Value Ref Range Status   MRSA by PCR Next Gen NOT DETECTED NOT DETECTED Final    Comment: (NOTE) The GeneXpert MRSA Assay (FDA approved for NASAL specimens only), is one component of a comprehensive MRSA colonization surveillance program. It is not intended to diagnose MRSA infection nor to guide or monitor treatment for MRSA infections. Test performance is not FDA approved in patients less than 43 years old. Performed at [facility], [address] [place], [place] [address]   Cath Tip Culture     Status: None   Collection Time: 04/27/21 11:59 AM   Specimen: Catheter Tip; Other  Result Value Ref Range Status   Specimen Description   Final    CATH TIP Performed at [facility], [address], [place], [place] [address]    Special Requests   Final    NONE Performed at [facility], [address], [place], [place] [address]    Culture   Final    NO GROWTH Performed at [facility], [place] [address], [place], [place] [address]    Report Status 05/01/2021 FINAL  Final  CULTURE, BLOOD (ROUTINE X 2) w Reflex to ID Panel     Status: None   Collection Time: 04/28/21 11:34 AM   Specimen: BLOOD  Result Value Ref Range Status   Specimen Description BLOOD RIGHT AC  Final   Special Requests   Final    BOTTLES DRAWN AEROBIC ONLY Blood Culture results may not be optimal due to an inadequate volume of blood received in culture bottles   Culture   Final    NO GROWTH 5 DAYS Performed at [facility], [address] [place], [place] [address]    Report Status 05/03/2021 FINAL  Final  CULTURE, BLOOD (ROUTINE X 2) w Reflex to ID Panel     Status: None   Collection Time: 04/28/21  5:27 PM   Specimen: BLOOD  Result Value Ref Range Status   Specimen Description BLOOD LT FINGER IN HAND  Final   Special Requests BOTTLES DRAWN AEROBIC ONLY  BCAV  Final   Culture   Final    NO GROWTH 5 DAYS Performed at [facility], [address], [place], [place] [address]    Report Status 05/03/2021 FINAL  Final    Coagulation Studies: No results for input(s): LABPROT, INR in the last 72 hours.   Urinalysis: No results for input(s): COLORURINE, LABSPEC, PHURINE, GLUCOSEU, HGBUR, BILIRUBINUR, KETONESUR, PROTEINUR, UROBILINOGEN, NITRITE, LEUKOCYTESUR in the last 72 hours.  Invalid input(s): APPERANCEUR    Imaging: No results found.   Medications:     ampicillin-sulbactam (UNASYN) IV Stopped (05/06/21 1032)    vitamin C  500 mg Oral BID   atorvastatin  20 mg Per Tube QPM   chlorhexidine  15 mL Mouth Rinse BID   Chlorhexidine Gluconate  Cloth  6 each Topical Q0600   cholecalciferol  1,000 Units Per Tube Daily   feeding supplement (NEPRO CARB STEADY)  237 mL Oral BID BM   fentaNYL  1 patch Transdermal Q72H   heparin  5,000 Units Subcutaneous Q8H   lipase/protease/amylase  36,000 Units Oral TID AC   loperamide  2 mg Oral BID   mouth rinse  15 mL Mouth Rinse q12n4p   midodrine  10 mg Per Tube TID WC   mirtazapine  7.5 mg Per Tube QHS   multivitamin  1 tablet Per Tube QHS   nutrition supplement (JUVEN)  1 packet Per Tube BID BM   QUEtiapine  25 mg Per Tube BID   silver sulfADIAZINE   Topical BID   cyanocobalamin  1,000 mcg Per Tube Daily   acetaminophen **OR** acetaminophen, albuterol, dextrose, diphenhydrAMINE, docusate sodium, fentaNYL (SUBLIMAZE) injection, heparin sodium (porcine), oxyCODONE-acetaminophen, polyvinyl alcohol  Assessment/ Plan:  [name] is a 61 y.o. black female with end stage renal disease on hemodialysis for greater than 14 years, hypertension, congestive heart failure, coronary artery disease, diabetes mellitus type II, who is admitted to [facility] on 04/25/2021 for Sepsis ([place]) [A41.9] Severe sepsis ([place]) [A41.9, R65.20] Fever in other diseases [R50.81]   [facility] MWF Lt Permcath 74kg   End Stage Renal Disease on dialysis: CRRT d/c on 8/8; patient is off pressors.   HD/CRRT via temp cath Received dialysis today, UF goal 1.5L achieved Vascular to place a permcath today Palliative care consulted and [place] discussions underway.    2. Sepsis: with hypotension and fever.  - permcath removed, tip cultures are neg - Acinetobacter bacteremia     3. Anemia of chronic kidney disease  Lab Results  Component Value Date   HGB 9.8 (L) 05/01/2021   Hgb at target, will continue to monitor Scheduled EPO SQ  weekly saturday   4. Secondary Hyperparathyroidism:    Lab Results  Component Value Date   PTH 357 (H) 10/05/2018   CALCIUM 8.4 (L) 05/06/2021   CAION 1.09 (L) 04/13/2020   PHOS 4.9 (H) 05/06/2021    Calcium and phosphorus not at goal   Hold phosphate binders until able to have oral intake.   5.  Hypokalemia Stable with dialysis  6. Volume overload - Volume removal IHD as tolerated -UF goal of 1.5L achieved    LOS: 11   8/15/20222:09 PM

## 2021-05-06 NOTE — Progress Notes (Signed)
OT Cancellation Note  Patient Details Name: EVERLYNN SAGUN MRN: [id number] DOB: 1961-06-20   Cancelled Treatment:    Reason Eval/Treat Not Completed: Patient at procedure or test/ unavailable. Pt noted to be off the unit for HD at this time, will follow up at later date/time as able to follow POC.   Dessie Coma, M.S. OTR/L  05/06/21, 1:50 PM  ascom [phone]

## 2021-05-06 NOTE — Progress Notes (Signed)
[name] MRN: [id number] DOB: [birth year]   Cancelled Treatment:    Reason Eval/Treat Not Completed: Other (comment). Pt currently off unit for HD at this time with plans for permcath placement this date. Not available for PT at this time. Will re-attempt next date.   [name] 05/06/2021, 11:23 AM [name], PT, DPT [phone]

## 2021-05-06 NOTE — Progress Notes (Signed)
Pt. with rt. femoral temp CVC w/o s/sx of infection, tolerated tx well, achieving BFR 400. Pt reaching target UF 1.5L. VSS. No meds given. Afebrile. SpO2>90% on 4 LPM via [place]. Drsg. to groin not due to be changed. Pt transferred back to unit.

## 2021-05-06 NOTE — Progress Notes (Signed)
PROGRESS NOTE    [name]  FWY:[id number] DOB: [birth year] DOA: 04/25/2021 PCP: Denton Lank, MD    Brief Narrative:   [name] is a 60 y.o. female with medical history significant for end-stage renal disease on hemodialysis on Monday, Wednesday, Friday schedule, diabetes mellitus complicated by peripheral polyneuropathy and gastroparesis,, hyperlipidemia, anemia of chronic kidney disease with baseline hemoglobin 8-10, hypertension, who is admitted to [facility] on 04/25/2021 with severe sepsis after presenting from home to [facility] ED complaining of fever.  Patient was diagnosed with septic shock, blood culture positive for Acinetobacter calcoaceticus.  This appears to be secondary to dialysis catheter infection.  Patient also developed acute hypoxemic respiratory failure required mechanical ventilation. Permacath was removed on 8/06.  Patient treated with Unasyn, last dose 8/20. Transfer out of ICU on 8/9.   Assessment & Plan:   Principal Problem:   Severe sepsis with septic shock (HCC) Active Problems:   Diabetes mellitus, type II ([place])   Hypoglycemia associated with diabetes ([place])   Generalized weakness   Acute hypoxemic respiratory failure (HCC)   Pressure ulcer, stage 2 (HCC)   ESRD (end stage renal disease) (HCC)   GERD (gastroesophageal reflux disease)   Acute metabolic encephalopathy   HCAP (healthcare-associated pneumonia)   Anemia of chronic kidney failure   Thrombocytopenia ([place])  #1.  Septic shock with Acinetobacter septicemia secondary to dialysis catheter infection. Acute hypoxemic respite failure secondary to septic shock. Permacath infection Patient condition has improved, still on Unasyn, last dose 8/20.  Diarrhea is better. Had a long discussion with patient and and his son.  It appears that the patient is not ready for quitting dialysis and going to hospice.  Currently, patient's mind is clear, after discussion with him and his son, decision  was made to pursue permacath placement.  Notified Dr. Lucky Cowboy and Dr. Holley Raring.  2.  End-stage renal disease. Hypophosphatemia. Hyponatremia. Patient condition is stable, continue hemodialysis via temporary dialysis catheter.   DVT prophylaxis: Heparin Code Status: DNR Family Communication: as above Disposition Plan:      Status is: Inpatient   Remains inpatient appropriate because:Inpatient level of care appropriate due to severity of illness   Dispo: The patient is from: Home              Anticipated d/c is to: Home              Patient currently is not medically stable to d/c.              Difficult to place patient No    No intake/output data recorded. No intake/output data recorded.     Consultants:  Nephrology, vascular  Procedures: HD  Antimicrobials: Unasyn  Subjective: Patient doing well today, she no longer has any confusion.  She denies any short of breath or cough. No abdominal pain or nausea vomiting.  Diarrhea improved after starting Imodium. Denies any fever chill No chest pain or palpitation  Objective: Vitals:   05/05/21 1943 05/06/21 0431 05/06/21 0443 05/06/21 0800  BP: (!) 170/66 (!) 160/67  (!) 165/70  Pulse: 78 79  81  Resp: 20   14  Temp: 98 F (36.7 C) 98 F (36.7 C)  98.4 F (36.9 C)  TempSrc: Oral Oral  Oral  SpO2: 100% 100%  100%  Weight:   77.4 kg   Height:       No intake or output data in the 24 hours ending 05/06/21 1001 Filed Weights   04/30/21  0500 05/01/21 0500 05/06/21 0443  Weight: 80.6 kg 75.5 kg 77.4 kg    Examination:  General exam: Appears calm and comfortable  Respiratory system: Clear to auscultation. Respiratory effort normal. Cardiovascular system: S1 & S2 heard, RRR. No JVD, murmurs, rubs, gallops or clicks. No pedal edema. Gastrointestinal system: Abdomen is nondistended, soft and nontender. No organomegaly or masses felt. Normal bowel sounds heard. Central nervous system: Alert and oriented x3. No focal  neurological deficits. Extremities: Symmetric 5 x 5 power. Skin: No rashes, lesions or ulcers Psychiatry: Mood & affect appropriate.     Data Reviewed: I have personally reviewed following labs and imaging studies  CBC: Recent Labs  Lab 04/29/21 1318 04/30/21 0512 05/01/21 0433  WBC  --  7.0 6.0  HGB 8.6* 9.2* 9.8*  HCT 26.6* 29.2* 30.7*  MCV  --  88.5 86.2  PLT  --  107* 536*   Basic Metabolic Panel: Recent Labs  Lab 04/30/21 0512 05/01/21 0433 05/02/21 0540 05/05/21 0607 05/06/21 0559  NA 133* 134* 135 133* 134*  K 3.8 3.6 3.9 4.3 4.7  CL 99 97* 98 96* 96*  CO2 26 27 32 29 28  GLUCOSE 100* 193* 161* 112* 141*  BUN 10 13 14  36* 46*  CREATININE 1.59* 1.87* 1.80* 3.22* 4.04*  CALCIUM 8.2* 8.3* 8.1* 8.4* 8.4*  MG  --  2.3 2.1 2.2 2.2  PHOS 2.2* 1.8* 1.9* 4.0 4.9*   GFR: Estimated Creatinine Clearance: 15.1 mL/min (A) (by C-G formula based on SCr of 4.04 mg/dL (H)). Liver Function Tests: Recent Labs  Lab 04/30/21 0512 05/01/21 0433 05/02/21 0540 05/05/21 0607 05/06/21 0559  ALBUMIN 2.4* 2.6* 2.6* 2.3* 2.5*   No results for input(s): LIPASE, AMYLASE in the last 168 hours. No results for input(s): AMMONIA in the last 168 hours. Coagulation Profile: No results for input(s): INR, PROTIME in the last 168 hours. Cardiac Enzymes: No results for input(s): CKTOTAL, CKMB, CKMBINDEX, TROPONINI in the last 168 hours. BNP (last 3 results) No results for input(s): PROBNP in the last 8760 hours. HbA1C: No results for input(s): HGBA1C in the last 72 hours. CBG: Recent Labs  Lab 05/05/21 0747 05/05/21 1134 05/05/21 2051 05/06/21 0443 05/06/21 0919  GLUCAP 133* 86 133* 145* 109*   Lipid Profile: No results for input(s): CHOL, HDL, LDLCALC, TRIG, CHOLHDL, LDLDIRECT in the last 72 hours. Thyroid Function Tests: No results for input(s): TSH, T4TOTAL, FREET4, T3FREE, THYROIDAB in the last 72 hours. Anemia Panel: No results for input(s): VITAMINB12, FOLATE,  FERRITIN, TIBC, IRON, RETICCTPCT in the last 72 hours. Sepsis Labs: No results for input(s): PROCALCITON, LATICACIDVEN in the last 168 hours.  Recent Results (from the past 240 hour(s))  MRSA Next Gen by PCR, Nasal     Status: None   Collection Time: 04/26/21  6:35 PM   Specimen: Nasal Mucosa; Nasal Swab  Result Value Ref Range Status   MRSA by PCR Next Gen NOT DETECTED NOT DETECTED Final    Comment: (NOTE) The GeneXpert MRSA Assay (FDA approved for NASAL specimens only), is one component of a comprehensive MRSA colonization surveillance program. It is not intended to diagnose MRSA infection nor to guide or monitor treatment for MRSA infections. Test performance is not FDA approved in patients less than 27 years old. Performed at [facility], [address] [place], [place] [address]   Cath Tip Culture     Status: None   Collection Time: 04/27/21 11:59 AM   Specimen: Catheter Tip; Other  Result  Value Ref Range Status   Specimen Description   Final    CATH TIP Performed at [facility], [address], [place], [place] [address]    Special Requests   Final    NONE Performed at [facility], [address], [place], [place] [address]    Culture   Final    NO GROWTH Performed at [facility], [place] [address], [place], [place] [address]    Report Status 05/01/2021 FINAL  Final  CULTURE, BLOOD (ROUTINE X 2) w Reflex to ID Panel     Status: None   Collection Time: 04/28/21 11:34 AM   Specimen: BLOOD  Result Value Ref Range Status   Specimen Description BLOOD RIGHT AC  Final   Special Requests   Final    BOTTLES DRAWN AEROBIC ONLY Blood Culture results may not be optimal due to an inadequate volume of blood received in culture bottles   Culture   Final    NO GROWTH 5 DAYS Performed at [facility], [address] [place], [place] [address]    Report Status 05/03/2021 FINAL  Final  CULTURE, BLOOD (ROUTINE X 2) w Reflex to ID  Panel     Status: None   Collection Time: 04/28/21  5:27 PM   Specimen: BLOOD  Result Value Ref Range Status   Specimen Description BLOOD LT FINGER IN HAND  Final   Special Requests BOTTLES DRAWN AEROBIC ONLY  BCAV  Final   Culture   Final    NO GROWTH 5 DAYS Performed at [facility], [address], [place], [place] [address]    Report Status 05/03/2021 FINAL  Final         Radiology Studies: No results found.      Scheduled Meds:  vitamin C  500 mg Oral BID   atorvastatin  20 mg Per Tube QPM   chlorhexidine  15 mL Mouth Rinse BID   Chlorhexidine Gluconate Cloth  6 each Topical Q0600   cholecalciferol  1,000 Units Per Tube Daily   feeding supplement (NEPRO CARB STEADY)  237 mL Oral BID BM   fentaNYL  1 patch Transdermal Q72H   heparin  5,000 Units Subcutaneous Q8H   lipase/protease/amylase  36,000 Units Oral TID AC   loperamide  2 mg Oral BID   mouth rinse  15 mL Mouth Rinse q12n4p   midodrine  10 mg Per Tube TID WC   mirtazapine  7.5 mg Per Tube QHS   multivitamin  1 tablet Per Tube QHS   nutrition supplement (JUVEN)  1 packet Per Tube BID BM   QUEtiapine  25 mg Per Tube BID   silver sulfADIAZINE   Topical BID   cyanocobalamin  1,000 mcg Per Tube Daily   Continuous Infusions:  ampicillin-sulbactam (UNASYN) IV 3 g (05/05/21 2053)     LOS: 11 days    Time spent: 32 minutes, more than 50% time spent on direct patient care.    Sharen Hones, MD [facility]   To contact the attending provider between 7A-7P or the covering provider during after hours 7P-7A, please log into the web site [email or web address] and access using universal [place] password for that web site. If you do not have the password, please call the hospital operator.  05/06/2021, 10:01 AM

## 2021-05-07 ENCOUNTER — Encounter
Admission: EM | Disposition: A | Discharge status: Skilled Nursing Facility | Payer: Medicare Other | Source: Home / Self Care | Attending: Internal Medicine

## 2021-05-07 ENCOUNTER — Encounter: Payer: Self-pay | Admitting: Vascular Surgery

## 2021-05-07 DIAGNOSIS — N186 End stage renal disease: Secondary | ICD-10-CM

## 2021-05-07 HISTORY — PX: DIALYSIS/PERMA CATHETER INSERTION: CATH118288

## 2021-05-07 LAB — HEPATITIS B DNA, ULTRAQUANTITATIVE, PCR
HBV DNA SERPL PCR-ACNC: <10 IU/mL
HBV DNA SERPL PCR-LOG IU: UNDETERMINED log10 IU/mL

## 2021-05-07 LAB — HEPATITIS B SURFACE ANTIBODY, QUANTITATIVE: Hep B S AB Quant (Post): >1000 m[IU]/mL (ref >9.9)

## 2021-05-07 LAB — GLUCOSE, CAPILLARY
Glucose-Capillary: 107 mg/dL — ABNORMAL HIGH (ref 70–99)
Glucose-Capillary: 79 mg/dL (ref 70–99)
Glucose-Capillary: 70 mg/dL (ref 70–99)
Glucose-Capillary: 91 mg/dL (ref 70–99)
Glucose-Capillary: 116 mg/dL — ABNORMAL HIGH (ref 70–99)
Glucose-Capillary: 89 mg/dL (ref 70–99)

## 2021-05-07 LAB — RENAL FUNCTION PANEL
Albumin: 2.1 g/dL — ABNORMAL LOW (ref 3.5–5.0)
Anion gap: 11 (ref 5–15)
BUN: 28 mg/dL — ABNORMAL HIGH (ref 8–23)
CO2: 26 mmol/L (ref 22–32)
Calcium: 7.8 mg/dL — ABNORMAL LOW (ref 8.9–10.3)
Chloride: 95 mmol/L — ABNORMAL LOW (ref 98–111)
Creatinine, Ser: 3.14 mg/dL — ABNORMAL HIGH (ref 0.44–1.00)
GFR, Estimated: 16 mL/min — ABNORMAL LOW (ref >60)
Glucose, Bld: 78 mg/dL (ref 70–99)
Phosphorus: 3.7 mg/dL (ref 2.5–4.6)
Potassium: 3.9 mmol/L (ref 3.5–5.1)
Sodium: 132 mmol/L — ABNORMAL LOW (ref 135–145)

## 2021-05-07 LAB — MAGNESIUM: Magnesium: 2.1 mg/dL (ref 1.7–2.4)

## 2021-05-07 SURGERY — DIALYSIS/PERMA CATHETER INSERTION
Anesthesia: Moderate Sedation

## 2021-05-07 MED ORDER — FENTANYL CITRATE (PF) 100 MCG/2ML IJ SOLN
INTRAMUSCULAR | Status: DC | PRN
  Administered 2021-05-07: 25 ug via INTRAVENOUS
  Administered 2021-05-07: 50 ug via INTRAVENOUS
  Administered 2021-05-07: 25 ug via INTRAVENOUS

## 2021-05-07 MED ORDER — IODIXANOL 320 MG/ML IV SOLN
INTRAVENOUS | Status: DC | PRN
  Administered 2021-05-07: 5 mL

## 2021-05-07 MED ORDER — HEPARIN SODIUM (PORCINE) 10000 UNIT/ML IJ SOLN
INTRAMUSCULAR | Status: AC
  Filled 2021-05-07: qty 1

## 2021-05-07 MED ORDER — FENTANYL CITRATE (PF) 100 MCG/2ML IJ SOLN
INTRAMUSCULAR | Status: AC
  Filled 2021-05-07: qty 2

## 2021-05-07 MED ORDER — MIDAZOLAM HCL 2 MG/2ML IJ SOLN
INTRAMUSCULAR | Status: AC
  Filled 2021-05-07: qty 2

## 2021-05-07 MED ORDER — CEFAZOLIN SODIUM-DEXTROSE 1-4 GM/50ML-% IV SOLN
1.0000 g | INTRAVENOUS | Status: DC
  Filled 2021-05-07: qty 50

## 2021-05-07 MED ORDER — CALCIUM CARBONATE ANTACID 500 MG PO CHEW
1.0000 | CHEWABLE_TABLET | Freq: Two times a day (BID) | ORAL | Status: DC
  Administered 2021-05-07 – 2021-05-09 (×6): 200 mg via ORAL
  Filled 2021-05-07 (×7): qty 1

## 2021-05-07 MED ORDER — MIDAZOLAM HCL 2 MG/2ML IJ SOLN
INTRAMUSCULAR | Status: DC | PRN
  Administered 2021-05-07: 0.5 mg via INTRAVENOUS
  Administered 2021-05-07: 1 mg via INTRAVENOUS
  Administered 2021-05-07: 0.5 mg via INTRAVENOUS

## 2021-05-07 SURGICAL SUPPLY — 12 items
BIOPATCH RED 1 DISK 7.0 (GAUZE/BANDAGES/DRESSINGS) ×1 IMPLANT
CATH CANNON HEMO 15FR 19 (HEMODIALYSIS SUPPLIES) ×1 IMPLANT
COVER PROBE U/S 5X48 (MISCELLANEOUS) ×1 IMPLANT
DERMABOND ADVANCED (GAUZE/BANDAGES/DRESSINGS) ×1
DERMABOND ADVANCED .7 DNX12 (GAUZE/BANDAGES/DRESSINGS) IMPLANT
GUIDEWIRE AMPLATZ SHORT (WIRE) ×1 IMPLANT
NDL ENTRY 21GA 7CM ECHOTIP (NEEDLE) IMPLANT
NEEDLE ENTRY 21GA 7CM ECHOTIP (NEEDLE) ×2 IMPLANT
PACK ANGIOGRAPHY (CUSTOM PROCEDURE TRAY) ×1 IMPLANT
SET INTRO CAPELLA COAXIAL (SET/KITS/TRAYS/PACK) ×3 IMPLANT
SUT MNCRL AB 4-0 PS2 18 (SUTURE) ×1 IMPLANT
SUT SILK 0 FSL (SUTURE) ×1 IMPLANT

## 2021-05-07 NOTE — Progress Notes (Signed)
[name] MRN: [id number] DOB: May 04, 1961   Cancelled Treatment:    Reason Eval/Treat Not Completed: Other (comment). Treatment attempted, however pt currently out of room for procedure. Will re-attempt next available date.    Arden Tinoco 05/07/2021, 9:04 AM [name], PT, DPT [phone]

## 2021-05-07 NOTE — Progress Notes (Signed)
PROGRESS NOTE    [name]  SEG:[id number] DOB: 1961/03/28 DOA: 04/25/2021 PCP: Denton Lank, MD    Brief Narrative:  [name] is a 60 y.o. female with medical history significant for end-stage renal disease on hemodialysis on Monday, Wednesday, Friday schedule, diabetes mellitus complicated by peripheral polyneuropathy and gastroparesis,, hyperlipidemia, anemia of chronic kidney disease with baseline hemoglobin 8-10, hypertension, who is admitted to [facility] on 04/25/2021 with severe sepsis after presenting from home to [facility] ED complaining of fever.  Patient was diagnosed with septic shock, blood culture positive for Acinetobacter calcoaceticus.  This appears to be secondary to dialysis catheter infection.  Patient also developed acute hypoxemic respiratory failure required mechanical ventilation. Permacath was removed on 8/06.  Patient treated with Unasyn, last dose 8/20.   Assessment & Plan:   Principal Problem:   Severe sepsis with septic shock (HCC) Active Problems:   Diabetes mellitus, type II ([place])   Hypoglycemia associated with diabetes ([place])   Generalized weakness   Acute hypoxemic respiratory failure (HCC)   Pressure ulcer, stage 2 (HCC)   ESRD (end stage renal disease) (HCC)   GERD (gastroesophageal reflux disease)   Acute metabolic encephalopathy   HCAP (healthcare-associated pneumonia)   Anemia of chronic kidney failure   Thrombocytopenia ([place])    #1.  Septic shock with Acinetobacter septicemia secondary to dialysis catheter infection. Acute hypoxemic respite failure secondary to septic shock. Permacath infection Patient condition has improved, currently she is off oxygen. Last dose antibiotics is 8/20.  2.  End-stage renal disease. Hypophosphatemia. Hyponatremia. Permacath is placed again today.  Patient be dialyzed again tomorrow.  DVT prophylaxis: Heparin Code Status: DNR Family Communication:  Disposition Plan:    Status is:  Inpatient  Remains inpatient appropriate because:Inpatient level of care appropriate due to severity of illness  Dispo: The patient is from: Home              Anticipated d/c is to: Home              Patient currently is not medically stable to d/c.   Difficult to place patient No        I/O last 3 completed shifts: In: -  Out: 1506 [Other:1506] No intake/output data recorded.     Consultants:  Nephrology  Procedures: Permcath  Antimicrobials: Cefazolin. Subjective: Patient doing well today, patient had a permacath placed. Denies any short of breath or cough.  Has good appetite without nausea vomiting. Diarrhea has resolved. No fever or chills. No abdominal pain  Objective: Vitals:   05/07/21 1114 05/07/21 1115 05/07/21 1130 05/07/21 1145  BP:   (!) 168/85 (!) 176/86  Pulse:  83 85 84  Resp: 14 16 16 16   Temp:      TempSrc:      SpO2:   97% 98%  Weight:      Height:       No intake or output data in the 24 hours ending 05/07/21 1346 Filed Weights   05/01/21 0500 05/06/21 0443 05/07/21 0524  Weight: 75.5 kg 77.4 kg 77.1 kg    Examination:  General exam: Appears calm and comfortable  Respiratory system: Clear to auscultation. Respiratory effort normal. Cardiovascular system: S1 & S2 heard, RRR. No JVD, murmurs, rubs, gallops or clicks. No pedal edema. Gastrointestinal system: Abdomen is nondistended, soft and nontender. No organomegaly or masses felt. Normal bowel sounds heard. Central nervous system: Alert and oriented x3. No focal neurological deficits. Extremities: Symmetric 5 x 5  power. Skin: No rashes, lesions or ulcers Psychiatry: Judgement and insight appear normal. Mood & affect appropriate.     Data Reviewed: I have personally reviewed following labs and imaging studies  CBC: Recent Labs  Lab 05/01/21 0433  WBC 6.0  HGB 9.8*  HCT 30.7*  MCV 86.2  PLT 962*   Basic Metabolic Panel: Recent Labs  Lab 05/01/21 0433 05/02/21 0540  05/05/21 0607 05/06/21 0559 05/07/21 0718  NA 134* 135 133* 134* 132*  K 3.6 3.9 4.3 4.7 3.9  CL 97* 98 96* 96* 95*  CO2 27 32 29 28 26   GLUCOSE 193* 161* 112* 141* 78  BUN 13 14 36* 46* 28*  CREATININE 1.87* 1.80* 3.22* 4.04* 3.14*  CALCIUM 8.3* 8.1* 8.4* 8.4* 7.8*  MG 2.3 2.1 2.2 2.2 2.1  PHOS 1.8* 1.9* 4.0 4.9* 3.7   GFR: Estimated Creatinine Clearance: 19.3 mL/min (A) (by C-G formula based on SCr of 3.14 mg/dL (H)). Liver Function Tests: Recent Labs  Lab 05/01/21 0433 05/02/21 0540 05/05/21 0607 05/06/21 0559 05/07/21 0718  ALBUMIN 2.6* 2.6* 2.3* 2.5* 2.1*   No results for input(s): LIPASE, AMYLASE in the last 168 hours. No results for input(s): AMMONIA in the last 168 hours. Coagulation Profile: No results for input(s): INR, PROTIME in the last 168 hours. Cardiac Enzymes: No results for input(s): CKTOTAL, CKMB, CKMBINDEX, TROPONINI in the last 168 hours. BNP (last 3 results) No results for input(s): PROBNP in the last 8760 hours. HbA1C: No results for input(s): HGBA1C in the last 72 hours. CBG: Recent Labs  Lab 05/06/21 2354 05/07/21 0333 05/07/21 0801 05/07/21 1117 05/07/21 1211  GLUCAP 139* 107* 79 70 91   Lipid Profile: No results for input(s): CHOL, HDL, LDLCALC, TRIG, CHOLHDL, LDLDIRECT in the last 72 hours. Thyroid Function Tests: No results for input(s): TSH, T4TOTAL, FREET4, T3FREE, THYROIDAB in the last 72 hours. Anemia Panel: No results for input(s): VITAMINB12, FOLATE, FERRITIN, TIBC, IRON, RETICCTPCT in the last 72 hours. Sepsis Labs: No results for input(s): PROCALCITON, LATICACIDVEN in the last 168 hours.  Recent Results (from the past 240 hour(s))  CULTURE, BLOOD (ROUTINE X 2) w Reflex to ID Panel     Status: None   Collection Time: 04/28/21 11:34 AM   Specimen: BLOOD  Result Value Ref Range Status   Specimen Description BLOOD RIGHT AC  Final   Special Requests   Final    BOTTLES DRAWN AEROBIC ONLY Blood Culture results may not be  optimal due to an inadequate volume of blood received in culture bottles   Culture   Final    NO GROWTH 5 DAYS Performed at [facility], [address] [place], [place] [address]    Report Status 05/03/2021 FINAL  Final  CULTURE, BLOOD (ROUTINE X 2) w Reflex to ID Panel     Status: None   Collection Time: 04/28/21  5:27 PM   Specimen: BLOOD  Result Value Ref Range Status   Specimen Description BLOOD LT FINGER IN HAND  Final   Special Requests BOTTLES DRAWN AEROBIC ONLY  BCAV  Final   Culture   Final    NO GROWTH 5 DAYS Performed at [facility], [address], [place], [place] [address]    Report Status 05/03/2021 FINAL  Final         Radiology Studies: PERIPHERAL VASCULAR CATHETERIZATION  Result Date: 05/07/2021 See surgical note for result.       Scheduled Meds:  vitamin C  500 mg Oral BID  atorvastatin  20 mg Per Tube QPM   calcium carbonate  1 tablet Oral BID   chlorhexidine  15 mL Mouth Rinse BID   Chlorhexidine Gluconate Cloth  6 each Topical Q0600   cholecalciferol  1,000 Units Per Tube Daily   feeding supplement (NEPRO CARB STEADY)  237 mL Oral BID BM   fentaNYL  1 patch Transdermal Q72H   fentaNYL       heparin       heparin  5,000 Units Subcutaneous Q8H   lipase/protease/amylase  36,000 Units Oral TID AC   loperamide  2 mg Oral BID   mouth rinse  15 mL Mouth Rinse q12n4p   midazolam       midodrine  10 mg Per Tube TID WC   mirtazapine  7.5 mg Per Tube QHS   multivitamin  1 tablet Per Tube QHS   nutrition supplement (JUVEN)  1 packet Per Tube BID BM   QUEtiapine  25 mg Per Tube BID   silver sulfADIAZINE   Topical BID   cyanocobalamin  1,000 mcg Per Tube Daily   Continuous Infusions:  ampicillin-sulbactam (UNASYN) IV 3 g (05/06/21 2134)   [START ON 05/08/2021]  ceFAZolin (ANCEF) IV       LOS: 12 days    Time spent: 26 minutes    Sharen Hones, MD [facility]   To contact the attending provider between 7A-7P or  the covering provider during after hours 7P-7A, please log into the web site [email or web address] and access using universal  password for that web site. If you do not have the password, please call the hospital operator.  05/07/2021, 1:46 PM

## 2021-05-07 NOTE — Progress Notes (Signed)
Pt refused lab draw this morning. This nurse explains the importance of labs as they are vital to her plan of care. No obvious signs acute distress is noted and patient verbally denies any discomfort.

## 2021-05-07 NOTE — Op Note (Addendum)
OPERATIVE NOTE   PROCEDURE: Insertion of tunneled dialysis catheter right external jugular approach with ultrasound and fluoroscopic guidance.  PRE-OPERATIVE DIAGNOSIS: End-stage renal disease requiring hemodialysis  POST-OPERATIVE DIAGNOSIS: Same  SURGEON: Hortencia Pilar.  ANESTHESIA: Conscious sedation was administered under my direct supervision by the interventional radiology RN. IV Versed plus fentanyl were utilized. Continuous ECG, pulse oximetry and blood pressure was monitored throughout the entire procedure. Conscious sedation was for a total of 34 minutes and 25 seconds.  ESTIMATED BLOOD LOSS: Minimal cc  CONTRAST USED:  None  FLUOROSCOPY TIME: 1.2 minutes  CONTRAST: 5 cc  INDICATIONS:   [name] a 61 y.o. y.o. female who presents with an adequate dialysis access.  Currently she is maintained via a temporary catheter in her right femoral vein.  She has recently been treated for catheter related sepsis and her left IJ catheter has been removed.  Today she is now undergoing placement of a new tunneled catheter in preparation for discharge.  Risk and benefits of been reviewed all questions been answered patient agrees to proceed.  DESCRIPTION: After obtaining full informed written consent, the patient was positioned supine. The right neck and chest wall was prepped and draped in a sterile fashion. Ultrasound was placed in a sterile sleeve. Ultrasound was utilized to identify a large vein at the base of the neck which was echolucent and compressible indicating patency.  The needle was then used to access this vein.  Wire was advanced but seem to curl up and what appeared to be the subclavian vein.  I then advanced the micro catheter over the wire.  Wire and dilator were removed and hand-injection contrast was utilized to demonstrate the venous anatomy.  This showed that somewhat more laterally just superior to the clavicle there appeared to be the external jugular vein as it  coursed down into the subclavian vein.  This would represent a more direct access if accessed more laterally rather than have the catheter performed essentially to switch backs and therefore I elected to leave the micro sheath in place I used lidocaine to numb up the supraclavicular tissues approximately 3 cm more lateral to the original stick.  I then accessed the the right external jugular vein vein with the microneedle followed by the microwire which advanced easily and fluoroscopy was confirmed to be in the atrium.  I then advanced the micro sheath over the microwire. Image of the veins for both punctures is recorded for the permanent record.  At this point I remove the first micro sheath and held pressure for 5 minutes.  The second micro-sheath was then advanced and an Amplatz wire is inserted without difficulty under fluoroscopic guidance. Small counterincision was made at the wire insertion site. Dilators are passed over the wire and the tunneled dialysis catheter is fed into the central venous system without difficulty.  Under fluoroscopy the catheter tip positioned at the atrial caval junction. The catheter is then approximated to the right chest wall and an exit site selected. 1% lidocaine is infiltrated in soft tissues at this level small incision is made and the tunneling device is then passed from the exit site to the right neck counterincision. Catheter is then connected to the tunneling device and the catheter was pulled subcutaneously. It is then transected and the hub assembly connected without difficulty. Both lumens aspirate and flush easily. After verification of smooth contour with proper tip position under fluoroscopy the catheter is packed with 5000 units of heparin per lumen.  Catheter secured  to the skin of the right chest wall with 0 silk. A sterile dressing is applied with a Biopatch.  COMPLICATIONS: None  CONDITION: Good  Hortencia Pilar [place] [facility]. Office:   [phone]   05/07/2021,10:59 AM

## 2021-05-07 NOTE — Progress Notes (Signed)
Hemodialysis patient known at [facility] MWF 12:45. Patient has spoken several times about whether or not to continue dialysis. Clinic is aware of complicated matter and will hold chair until a decision is reached and resume same schedule should patient continue dialysis. Please contact me with any dialysis placement concerns.  Elvera Bicker Dialysis Coordinator [phone]

## 2021-05-07 NOTE — Progress Notes (Signed)
OT Cancellation Note  Patient Details Name: [name] MRN: [id number] DOB: September 28, 1960   Cancelled Treatment:    Reason Eval/Treat Not Completed: Patient at procedure or test/ unavailable. Pt currently off the floor for placement of dialysis catheter. OT to re-attempt at later time/date as able.   Fredirick Maudlin, OTR/L [place]

## 2021-05-07 NOTE — Progress Notes (Signed)
[facility]  ROUNDING NOTE   Subjective:   [name] was admitted to [facility] on 04/25/2021 for Sepsis [facility]) [A41.9] Severe sepsis ([place]) [A41.9, R65.20] Fever in other diseases [R50.81]  Extubated 04/28/2021 Patient seen sitting up in bed Eating breakfast Voicing frustration with family not wanting to honor her health care wishes. States she is tired and been through a lot. States she would be great if she had a full healthcare team to take home with her. Says her son has told her in the past that he couldn't do any since he helps take care of her. Her grandchild is scared to come near her with her arm being swollen and scars from dialysis and procedures. She says she knows this will only get worse. She states she does not want any further procedures.   Patient seen after procedure Drowsy No complaints at this time  No pain reported    Objective:  Vital signs in last 24 hours:  Temp:  [98 F (36.7 C)-98.6 F (37 C)] 98 F (36.7 C) (08/16 0759) Pulse Rate:  [69-79] 77 (08/16 0759) Resp:  [10-18] 16 (08/16 0759) BP: (154-176)/(54-73) 157/72 (08/16 0759) SpO2:  [100 %] 100 % (08/16 0759) Weight:  [77.1 kg] 77.1 kg (08/16 0524)  Weight change: -0.3 kg Filed Weights   05/01/21 0500 05/06/21 0443 05/07/21 0524  Weight: 75.5 kg 77.4 kg 77.1 kg    Intake/Output: I/O last 3 completed shifts: In: -  Out: 1506 [Other:1506]   Intake/Output this shift:  No intake/output data recorded.  Physical Exam: General: NAD  Head: Normocephalic, atraumatic. Moist oral mucosal membranes  Lungs:  Coarse breath sounds, normal breathing effort  Heart: Regular rate and rhythm  Abdomen:  Soft, nontender  Extremities: ++ peripheral edema.  Neurologic: Able to follow commands and answer simple questions  Skin: Bilateral foot wounds  Access: Right femoral temporary dialysis catheter, Rt EJ Permcath placed on 05/07/21 by Schnier    Basic Metabolic Panel: Recent Labs  Lab  05/01/21 0433 05/02/21 0540 05/05/21 0607 05/06/21 0559 05/07/21 0718  NA 134* 135 133* 134* 132*  K 3.6 3.9 4.3 4.7 3.9  CL 97* 98 96* 96* 95*  CO2 27 32 29 28 26   GLUCOSE 193* 161* 112* 141* 78  BUN 13 14 36* 46* 28*  CREATININE 1.87* 1.80* 3.22* 4.04* 3.14*  CALCIUM 8.3* 8.1* 8.4* 8.4* 7.8*  MG 2.3 2.1 2.2 2.2 2.1  PHOS 1.8* 1.9* 4.0 4.9* 3.7     Liver Function Tests: Recent Labs  Lab 05/01/21 0433 05/02/21 0540 05/05/21 0607 05/06/21 0559 05/07/21 0718  ALBUMIN 2.6* 2.6* 2.3* 2.5* 2.1*    No results for input(s): LIPASE, AMYLASE in the last 168 hours.  No results for input(s): AMMONIA in the last 168 hours.   CBC: Recent Labs  Lab 05/01/21 0433  WBC 6.0  HGB 9.8*  HCT 30.7*  MCV 86.2  PLT 123*     Cardiac Enzymes: No results for input(s): CKTOTAL, CKMB, CKMBINDEX, TROPONINI in the last 168 hours.  BNP: Invalid input(s): POCBNP  CBG: Recent Labs  Lab 05/06/21 1631 05/06/21 2054 05/06/21 2354 05/07/21 0333 05/07/21 0801  GLUCAP 95 127* 139* 107* 90     Microbiology: Results for orders placed or performed during the hospital encounter of 04/25/21  Culture, blood (Routine x 2)     Status: Abnormal   Collection Time: 04/25/21  4:51 PM   Specimen: BLOOD  Result Value Ref Range Status   Specimen  Description   Final    BLOOD LW Performed at [facility], [address] [place], [place] [address]    Special Requests   Final    BOTTLES DRAWN AEROBIC AND ANAEROBIC Blood Culture adequate volume Performed at [facility], [address] [place], [place] [address]    Culture  Setup Time   Final    GRAM VARIABLE COCCOBACILLI AEROBIC BOTTLE ONLY Organism ID to follow CRITICAL RESULT CALLED TO, READ BACK BY AND VERIFIED WITH: [facility] AT 1014 04/26/21.PMF Performed at [facility], [address] [place], [place] [address]    Culture ACINETOBACTER CALCOACETICUS/BAUMANNII COMPLEX (A)  Final   Report  Status 04/28/2021 FINAL  Final   Organism ID, Bacteria ACINETOBACTER CALCOACETICUS/BAUMANNII COMPLEX  Final      Susceptibility   Acinetobacter calcoaceticus/baumannii complex - MIC*    CEFTAZIDIME 16 INTERMEDIATE Intermediate     CIPROFLOXACIN <=0.25 SENSITIVE Sensitive     GENTAMICIN <=1 SENSITIVE Sensitive     IMIPENEM <=0.25 SENSITIVE Sensitive     PIP/TAZO 32 INTERMEDIATE Intermediate     TRIMETH/SULFA <=20 SENSITIVE Sensitive     AMPICILLIN/SULBACTAM <=2 SENSITIVE Sensitive     * ACINETOBACTER CALCOACETICUS/BAUMANNII COMPLEX  Blood Culture ID Panel (Reflexed)     Status: Abnormal   Collection Time: 04/25/21  4:51 PM  Result Value Ref Range Status   Enterococcus faecalis NOT DETECTED NOT DETECTED Final   Enterococcus Faecium NOT DETECTED NOT DETECTED Final   Listeria monocytogenes NOT DETECTED NOT DETECTED Final   Staphylococcus species NOT DETECTED NOT DETECTED Final   Staphylococcus aureus (BCID) NOT DETECTED NOT DETECTED Final   Staphylococcus epidermidis NOT DETECTED NOT DETECTED Final   Staphylococcus lugdunensis NOT DETECTED NOT DETECTED Final   Streptococcus species NOT DETECTED NOT DETECTED Final   Streptococcus agalactiae NOT DETECTED NOT DETECTED Final   Streptococcus pneumoniae NOT DETECTED NOT DETECTED Final   Streptococcus pyogenes NOT DETECTED NOT DETECTED Final   A.calcoaceticus-baumannii DETECTED (A) NOT DETECTED Final    Comment: CRITICAL RESULT CALLED TO, READ BACK BY AND VERIFIED WITH: CHARLES SHANLEVER AT 1014 04/26/21.PMF    Bacteroides fragilis NOT DETECTED NOT DETECTED Final   Enterobacterales NOT DETECTED NOT DETECTED Final   Enterobacter cloacae complex NOT DETECTED NOT DETECTED Final   Escherichia coli NOT DETECTED NOT DETECTED Final   Klebsiella aerogenes NOT DETECTED NOT DETECTED Final   Klebsiella oxytoca NOT DETECTED NOT DETECTED Final   Klebsiella pneumoniae NOT DETECTED NOT DETECTED Final   Proteus species NOT DETECTED NOT DETECTED Final    Salmonella species NOT DETECTED NOT DETECTED Final   Serratia marcescens NOT DETECTED NOT DETECTED Final   Haemophilus influenzae NOT DETECTED NOT DETECTED Final   Neisseria meningitidis NOT DETECTED NOT DETECTED Final   Pseudomonas aeruginosa NOT DETECTED NOT DETECTED Final   Stenotrophomonas maltophilia NOT DETECTED NOT DETECTED Final   Candida albicans NOT DETECTED NOT DETECTED Final   Candida auris NOT DETECTED NOT DETECTED Final   Candida glabrata NOT DETECTED NOT DETECTED Final   Candida krusei NOT DETECTED NOT DETECTED Final   Candida parapsilosis NOT DETECTED NOT DETECTED Final   Candida tropicalis NOT DETECTED NOT DETECTED Final   Cryptococcus neoformans/gattii NOT DETECTED NOT DETECTED Final   CTX-M ESBL NOT DETECTED NOT DETECTED Final   Carbapenem resistance IMP NOT DETECTED NOT DETECTED Final   Carbapenem resistance KPC NOT DETECTED NOT DETECTED Final   Carbapenem resistance NDM NOT DETECTED NOT DETECTED Final   Carbapenem resistance VIM NOT DETECTED NOT DETECTED  Final    Comment: Performed at [facility], [address] [place], [place] [address]  Culture, blood (Routine x 2)     Status: Abnormal   Collection Time: 04/25/21  6:38 PM   Specimen: BLOOD  Result Value Ref Range Status   Specimen Description   Final    BLOOD BLOOD LEFT WRIST Performed at [facility], [address], [place], [place] [address]    Special Requests   Final    BOTTLES DRAWN AEROBIC AND ANAEROBIC Blood Culture adequate volume Performed at [facility], [address] [place], [place] [address]    Culture  Setup Time   Final    GRAM VARIABLE COCCOBACILLI AEROBIC BOTTLE ONLY CRITICAL VALUE NOTED.  VALUE IS CONSISTENT WITH PREVIOUSLY REPORTED AND CALLED VALUE. Performed at [facility], [address] [place], [place] [address]    Culture (A)  Final    ACINETOBACTER CALCOACETICUS/BAUMANNII COMPLEX SUSCEPTIBILITIES PERFORMED ON PREVIOUS CULTURE WITHIN  THE LAST 5 DAYS. Performed at [facility], [place] [address], [place], [place] [address]    Report Status 04/28/2021 FINAL  Final  Resp Panel by RT-PCR (Flu A&B, Covid) Nasopharyngeal Swab     Status: None   Collection Time: 04/25/21  6:54 PM   Specimen: Nasopharyngeal Swab; Nasopharyngeal(NP) swabs in vial transport medium  Result Value Ref Range Status   SARS Coronavirus 2 by RT PCR NEGATIVE NEGATIVE Final    Comment: (NOTE) SARS-CoV-2 target nucleic acids are NOT DETECTED.  The SARS-CoV-2 RNA is generally detectable in upper respiratory specimens during the acute phase of infection. The lowest concentration of SARS-CoV-2 viral copies this assay can detect is 138 copies/mL. A negative result does not preclude SARS-Cov-2 infection and should not be used as the sole basis for treatment or other patient management decisions. A negative result may occur with  improper specimen collection/handling, submission of specimen other than nasopharyngeal swab, presence of viral mutation(s) within the areas targeted by this assay, and inadequate number of viral copies(<138 copies/mL). A negative result must be combined with clinical observations, patient history, and epidemiological information. The expected result is Negative.  Fact Sheet for Patients:  EntrepreneurPulse.com.au  Fact Sheet for Healthcare Providers:  IncredibleEmployment.be  This test is no t yet approved or cleared by the [place] FDA and  has been authorized for detection and/or diagnosis of SARS-CoV-2 by FDA under an Emergency Use Authorization (EUA). This EUA will remain  in effect (meaning this test can be used) for the duration of the COVID-19 declaration under Section 564(b)(1) of the Act, 21 U.S.C.section 360bbb-3(b)(1), unless the authorization is terminated  or revoked sooner.       Influenza A by PCR NEGATIVE NEGATIVE Final   Influenza B by PCR NEGATIVE NEGATIVE  Final    Comment: (NOTE) The Xpert Xpress SARS-CoV-2/FLU/RSV plus assay is intended as an aid in the diagnosis of influenza from Nasopharyngeal swab specimens and should not be used as a sole basis for treatment. Nasal washings and aspirates are unacceptable for Xpert Xpress SARS-CoV-2/FLU/RSV testing.  Fact Sheet for Patients: EntrepreneurPulse.com.au  Fact Sheet for Healthcare Providers: IncredibleEmployment.be  This test is not yet approved or cleared by the [place] FDA and has been authorized for detection and/or diagnosis of SARS-CoV-2 by FDA under an Emergency Use Authorization (EUA). This EUA will remain in effect (meaning this test can be used) for the duration of the COVID-19 declaration under Section 564(b)(1) of the Act, 21 U.S.C. section 360bbb-3(b)(1), unless  the authorization is terminated or revoked.  Performed at [facility], [address] [place], [place] [address]   MRSA Next Gen by PCR, Nasal     Status: None   Collection Time: 04/26/21  6:35 PM   Specimen: Nasal Mucosa; Nasal Swab  Result Value Ref Range Status   MRSA by PCR Next Gen NOT DETECTED NOT DETECTED Final    Comment: (NOTE) The GeneXpert MRSA Assay (FDA approved for NASAL specimens only), is one component of a comprehensive MRSA colonization surveillance program. It is not intended to diagnose MRSA infection nor to guide or monitor treatment for MRSA infections. Test performance is not FDA approved in patients less than 10 years old. Performed at [facility], [address] [place], [place] [address]   Cath Tip Culture     Status: None   Collection Time: 04/27/21 11:59 AM   Specimen: Catheter Tip; Other  Result Value Ref Range Status   Specimen Description   Final    CATH TIP Performed at [facility], [address] [place], [place] [address]    Special Requests   Final    NONE Performed at [facility], [address] [place], [place] [address]    Culture   Final    NO GROWTH Performed at [facility], [place] [address] [place], [place] [address]    Report Status 05/01/2021 FINAL  Final  CULTURE, BLOOD (ROUTINE X 2) w Reflex to ID Panel     Status: None   Collection Time: 04/28/21 11:34 AM   Specimen: BLOOD  Result Value Ref Range Status   Specimen Description BLOOD RIGHT AC  Final   Special Requests   Final    BOTTLES DRAWN AEROBIC ONLY Blood Culture results may not be optimal due to an inadequate volume of blood received in culture bottles   Culture   Final    NO GROWTH 5 DAYS Performed at [facility], [address] [place], [place] [address]    Report Status 05/03/2021 FINAL  Final  CULTURE, BLOOD (ROUTINE X 2) w Reflex to ID Panel     Status: None   Collection Time: 04/28/21  5:27 PM   Specimen: BLOOD  Result Value Ref Range Status   Specimen Description BLOOD LT FINGER IN HAND  Final   Special Requests BOTTLES DRAWN AEROBIC ONLY  BCAV  Final   Culture   Final    NO GROWTH 5 DAYS Performed at [facility], [address] [place], [place] [address]    Report Status 05/03/2021 FINAL  Final    Coagulation Studies: No results for input(s): LABPROT, INR in the last 72 hours.   Urinalysis: No results for input(s): COLORURINE, LABSPEC, PHURINE, GLUCOSEU, HGBUR, BILIRUBINUR, KETONESUR, PROTEINUR, UROBILINOGEN, NITRITE, LEUKOCYTESUR in the last 72 hours.  Invalid input(s): APPERANCEUR    Imaging: No results found.   Medications:    ampicillin-sulbactam (UNASYN) IV 3 g (05/06/21 2134)   [START ON 05/08/2021]  ceFAZolin (ANCEF) IV      vitamin C  500 mg Oral BID   atorvastatin  20 mg Per Tube QPM   chlorhexidine  15 mL Mouth Rinse BID   Chlorhexidine Gluconate Cloth  6 each Topical Q0600   cholecalciferol  1,000 Units Per Tube Daily   feeding supplement (NEPRO CARB STEADY)  237 mL Oral BID BM   fentaNYL  1 patch Transdermal Q72H    heparin  5,000 Units Subcutaneous Q8H  lipase/protease/amylase  36,000 Units Oral TID AC   loperamide  2 mg Oral BID   mouth rinse  15 mL Mouth Rinse q12n4p   midodrine  10 mg Per Tube TID WC   mirtazapine  7.5 mg Per Tube QHS   multivitamin  1 tablet Per Tube QHS   nutrition supplement (JUVEN)  1 packet Per Tube BID BM   QUEtiapine  25 mg Per Tube BID   silver sulfADIAZINE   Topical BID   cyanocobalamin  1,000 mcg Per Tube Daily   acetaminophen **OR** acetaminophen, albuterol, dextrose, diphenhydrAMINE, docusate sodium, fentaNYL (SUBLIMAZE) injection, heparin sodium (porcine), oxyCODONE-acetaminophen, polyvinyl alcohol  Assessment/ Plan:  [name] is a 60 y.o. black female with end stage renal disease on hemodialysis for greater than 14 years, hypertension, congestive heart failure, coronary artery disease, diabetes mellitus type II, who is admitted to [facility] on 04/25/2021 for Sepsis ([place]) [A41.9] Severe sepsis ([place]) [A41.9, R65.20] Fever in other diseases [R50.81]   [facility] MWF Lt Permcath 74kg   End Stage Renal Disease on dialysis: CRRT d/c on 8/8; patient is off pressors.   HD/CRRT via temp cath Received dialysis yesterday, UF 1.5L removed  Appreciate vascular placement of Rt EJ Permcath today Will have nursing d/c temp cath Next dialysis scheduled for tomorrow.   2. Sepsis: with hypotension and fever.  - permcath removed, tip cultures are neg - afebrile - Continue antibiotics for Acinetobacter bacteremia     3. Anemia of chronic kidney disease  Lab Results  Component Value Date   HGB 9.8 (L) 05/01/2021   Hgb within target, will continue to monitor Will reschedule EPO with treatments, when needed   4. Secondary Hyperparathyroidism:    Lab Results  Component Value Date   PTH 357 (H) 10/05/2018   CALCIUM 7.8 (L) 05/07/2021   CAION 1.09 (L) 04/13/2020   PHOS 3.7 05/07/2021    Calcium remains below target, but phosphorus is at goal   Will order  calcium carbonate BID  5.  Hypokalemia Improved with dialysis  6. Volume overload - Will manage with dialysis    LOS: 12   8/16/20229:01 AM

## 2021-05-07 NOTE — Interval H&P Note (Signed)
History and Physical Interval Note:  05/07/2021 8:28 AM  [name]  has presented today for placement of a tunneled dialysis catheter, with the diagnosis of ESRD.  The various methods of treatment have been discussed with the patient and family. After consideration of risks, benefits and other options for treatment, the patient has consented to  Procedure(s) with comments: DIALYSIS/PERMA CATHETER INSERTION (N/A) - Patient needs general anethesia as a surgical intervention.  The patient's history has been reviewed there has been interval change of sepsis and subsequently removal of the previously existing catheter.  She now requires placement of a new access so that she can maintain her dialysis therapy., patient examined, no change in status, stable for surgery.  I have reviewed the patient's chart and labs.  Questions were answered to the patient's satisfaction.     Hortencia Pilar

## 2021-05-07 NOTE — Plan of Care (Addendum)
PMT note: In to see patient however she is not currently in her room, and has been taken to the OR.

## 2021-05-07 NOTE — Plan of Care (Signed)
  Problem: Education: Goal: Knowledge of General Education information will improve Description Including pain rating scale, medication(s)/side effects and non-pharmacologic comfort measures Outcome: Progressing   

## 2021-05-08 DIAGNOSIS — R6521 Severe sepsis with septic shock: Secondary | ICD-10-CM | POA: Diagnosis not present

## 2021-05-08 DIAGNOSIS — A419 Sepsis, unspecified organism: Secondary | ICD-10-CM | POA: Diagnosis not present

## 2021-05-08 LAB — CBC
HCT: 25.4 % — ABNORMAL LOW (ref 36.0–46.0)
Hemoglobin: 8.1 g/dL — ABNORMAL LOW (ref 12.0–15.0)
MCH: 27.7 pg (ref 26.0–34.0)
MCHC: 31.9 g/dL (ref 30.0–36.0)
MCV: 87 fL (ref 80.0–100.0)
Platelets: 180 10*3/uL (ref 150–400)
RBC: 2.92 MIL/uL — ABNORMAL LOW (ref 3.87–5.11)
RDW: 20.7 % — ABNORMAL HIGH (ref 11.5–15.5)
WBC: 6.2 10*3/uL (ref 4.0–10.5)
nRBC: 0 % (ref 0.0–0.2)

## 2021-05-08 LAB — MAGNESIUM: Magnesium: 2.1 mg/dL (ref 1.7–2.4)

## 2021-05-08 LAB — GLUCOSE, CAPILLARY
Glucose-Capillary: 138 mg/dL — ABNORMAL HIGH (ref 70–99)
Glucose-Capillary: 118 mg/dL — ABNORMAL HIGH (ref 70–99)
Glucose-Capillary: 113 mg/dL — ABNORMAL HIGH (ref 70–99)
Glucose-Capillary: 143 mg/dL — ABNORMAL HIGH (ref 70–99)

## 2021-05-08 LAB — RENAL FUNCTION PANEL
Albumin: 2.1 g/dL — ABNORMAL LOW (ref 3.5–5.0)
Anion gap: 9 (ref 5–15)
BUN: 35 mg/dL — ABNORMAL HIGH (ref 8–23)
CO2: 28 mmol/L (ref 22–32)
Calcium: 7.9 mg/dL — ABNORMAL LOW (ref 8.9–10.3)
Chloride: 98 mmol/L (ref 98–111)
Creatinine, Ser: 3.93 mg/dL — ABNORMAL HIGH (ref 0.44–1.00)
GFR, Estimated: 12 mL/min — ABNORMAL LOW (ref >60)
Glucose, Bld: 128 mg/dL — ABNORMAL HIGH (ref 70–99)
Phosphorus: 4.9 mg/dL — ABNORMAL HIGH (ref 2.5–4.6)
Potassium: 4.2 mmol/L (ref 3.5–5.1)
Sodium: 135 mmol/L (ref 135–145)

## 2021-05-08 MED ORDER — CIPROFLOXACIN HCL 500 MG PO TABS
500.0000 mg | ORAL_TABLET | ORAL | Status: DC
  Administered 2021-05-08 – 2021-05-09 (×2): 500 mg via ORAL
  Filled 2021-05-08 (×2): qty 1

## 2021-05-08 MED ORDER — HEPARIN SODIUM (PORCINE) 1000 UNIT/ML IJ SOLN
INTRAMUSCULAR | Status: AC
  Filled 2021-05-08: qty 1

## 2021-05-08 MED ORDER — EPOETIN ALFA 10000 UNIT/ML IJ SOLN
10000.0000 [IU] | Freq: Once | INTRAMUSCULAR | Status: AC
Start: 2021-05-08 — End: 2021-05-08
  Administered 2021-05-08: 10000 [IU] via INTRAVENOUS

## 2021-05-08 MED ORDER — MECLIZINE HCL 25 MG PO TABS
25.0000 mg | ORAL_TABLET | Freq: Four times a day (QID) | ORAL | Status: DC | PRN
  Filled 2021-05-08: qty 1

## 2021-05-08 MED ORDER — BOOST / RESOURCE BREEZE PO LIQD CUSTOM
1.0000 | Freq: Three times a day (TID) | ORAL | Status: DC
  Administered 2021-05-10: 1 via ORAL

## 2021-05-08 NOTE — Progress Notes (Addendum)
Late entry: this patient arrived for dialysis sleepy but arousable, denied pain at arrival, on 3L of oxygen via nasal cannula. Vital signs stable on arrival, right upper chest cvc dressing was soaked with old serosanguinous drainage. Dressing was changed and no signs of infection were noted. New dressing and biopatch have been placed using sterile technique.

## 2021-05-08 NOTE — NC FL2 (Signed)
[place] LEVEL OF CARE SCREENING TOOL     IDENTIFICATION  Patient Name: [name] Birthdate: 1961-07-25 Sex: female Admission Date (Current Location): 04/25/2021  [facility] and [place] Number:  Engineering geologist and Address:  [facility], [address], [place], [place] [address]      Provider Number: [id number]  Attending Physician Name and Address:  Gwynne Edinger, MD  Relative Name and Phone Number:       Current Level of Care: Hospital Recommended Level of Care: [place] Prior Approval Number:    Date Approved/Denied:   PASRR Number:  [id number]  Discharge Plan: SNF    Current Diagnoses: Patient Active Problem List   Diagnosis Date Noted   Anemia of chronic kidney failure 05/03/2021   Thrombocytopenia ([place]) 05/03/2021   Severe sepsis with septic shock ([place]) 04/25/2021   HCAP (healthcare-associated pneumonia) 44/31/5400   Acute metabolic encephalopathy 86/76/1950   UTI (urinary tract infection) 03/29/2021   Chronic respiratory failure with hypoxia ([place]) 12/18/2020   Superior vena caval syndrome 12/14/2020   Chronic diastolic CHF (congestive heart failure) ([place]) 12/04/2020   Bradycardia 09/17/2020   Advanced care planning/counseling discussion    Discitis of thoracic region    Fluid overload 08/10/2020   HLD (hyperlipidemia) 08/10/2020   Chest pain 08/10/2020   CAD (coronary artery disease) 08/10/2020   GERD (gastroesophageal reflux disease) 08/10/2020   Obesity (BMI 30-39.9) 06/06/2020   Anemia in ESRD (end-stage renal disease) ([place]) 05/31/2020   Type 1 diabetes mellitus ([place]) 05/31/2020   End-stage renal disease ([place]) 05/31/2020   Hypertensive disorder 05/31/2020   Arm pain 05/21/2020   Cellulitis of right leg 05/02/2020   ESRD (end stage renal disease) ([place]) 04/13/2020   SOBOE (shortness of breath on exertion) 02/08/2020   Hematoma of arm, left, subsequent encounter 11/24/2019   Respiratory  failure (HCC)    Shortness of breath    Complication of arteriovenous dialysis fistula 11/13/2019   PVD (peripheral vascular disease) ([place]) 11/13/2019   Severe anemia 11/13/2019   Pressure ulcer, stage 2 ([place]) 11/13/2019   ESRD on hemodialysis ([place])    Hemorrhagic shock ([place]) 11/11/2019   Leg pain 10/02/2019   CAP (community acquired pneumonia) 10/01/2019   Atherosclerosis of native arteries of extremity with intermittent claudication ([place]) 05/01/2019   Right-sided headache    COPD (chronic obstructive pulmonary disease) ([place]) 10/06/2018   Syncope 10/04/2018   Symptomatic anemia 06/18/2018   Gastroparesis due to DM ([place]) 93/26/7124   Complication of vascular access for dialysis 12/04/2017   Osteomyelitis ([place]) 09/30/2017   Carotid stenosis 06/18/2017   Bilateral carotid artery stenosis 06/16/2017   Benign essential HTN 05/19/2017   CKD (chronic kidney disease) stage 5, GFR less than 15 ml/min ([place]) 05/19/2017   Hyperlipidemia, mixed 05/19/2017   Shortness of breath 05/04/2017   Rash, skin 12/16/2016   Cellulitis of lower extremity 07/29/2016   Chronic venous insufficiency 07/29/2016   Lymphedema 07/29/2016   TIA (transient ischemic attack) 04/21/2016   Altered mental status 04/08/2016   Hyperammonemia ([place]) 04/08/2016   Elevated troponin 04/08/2016   Depression 04/08/2016   Depression, major, recurrent, severe with psychosis ([place]) 04/08/2016   Blood in stool    Intractable cyclical vomiting with nausea    Reflux esophagitis    Gastritis    Generalized abdominal pain    Uncontrollable vomiting    Major depressive disorder, recurrent episode, moderate ([place]) 03/15/2016   Adjustment disorder with mixed anxiety and depressed mood 03/15/2016  Malnutrition of moderate degree 12/01/2015   Renal mass    Dyspnea    Acute renal failure (HCC)    Respiratory failure ([employer])    High temperature 11/14/2015   Encounter for central line placement    Encounter for orogastric (OG) tube  placement    Nausea 11/12/2015   Hyperkalemia 10/03/2015   Diarrhea, unspecified 07/22/2015   Pneumonia 05/21/2015   Hypoglycemia 04/24/2015   Unresponsiveness 04/24/2015   Sinus bradycardia 04/24/2015   Hypothermia 04/24/2015   Acute hypoxemic respiratory failure ([place]) 04/24/2015   Acute on chronic diastolic CHF (congestive heart failure) ([place]) 04/05/2015   Diabetic gastroparesis ([place]) 04/05/2015   Hypokalemia 04/05/2015   Generalized weakness 04/05/2015   Acute pulmonary edema ([place]) 04/03/2015   Nausea and vomiting 04/03/2015   Hypoglycemia associated with diabetes ([place]) 04/03/2015   Anemia of chronic disease 04/03/2015   Secondary hyperparathyroidism ([place]) 04/03/2015   Pressure ulcer 04/02/2015   Acute respiratory failure with hypoxia ([place]) 04/01/2015   Adjustment disorder with anxiety 03/14/2015   Somatic symptom disorder, mild 03/08/2015   Coronary artery disease involving native coronary artery of native heart without angina pectoris    Nausea & vomiting 03/06/2015   Abdominal pain 03/06/2015   Diabetes mellitus, type II ([place]) 03/06/2015   HTN (hypertension) 03/06/2015   Gastroparesis 02/24/2015   Pleural effusion 02/19/2015   Bacteremia due to Enterococcus 02/19/2015   End-stage renal disease on hemodialysis ([place]) 02/19/2015   Diarrhea 08/01/2014    Orientation RESPIRATION BLADDER Height & Weight     Self, Time, Situation, Place  O2 ([place] 2L) Continent Weight: 172 lb 6.4 oz (78.2 kg) Height:  5\' 5"  (165.1 cm)  BEHAVIORAL SYMPTOMS/MOOD NEUROLOGICAL BOWEL NUTRITION STATUS      Incontinent Diet (Carb Modified, Fluid restriction: 1200 mL Fluid)  AMBULATORY STATUS COMMUNICATION OF NEEDS Skin   Limited Assist Verbally PU Stage and Appropriate Care (open wounds on right heel, and right and left foot)   PU Stage 2 Dressing:  (Buttocks)                   Personal Care Assistance Level of Assistance  Dressing, Feeding, Bathing Bathing Assistance: Limited  assistance Feeding assistance: Independent Dressing Assistance: Limited assistance     Functional Limitations Info  Sight, Hearing, Speech Sight Info: Adequate Hearing Info: Adequate Speech Info: Adequate    SPECIAL CARE FACTORS FREQUENCY  PT (By licensed PT), OT (By licensed OT)     PT Frequency: 5x OT Frequency: 5x            Contractures Contractures Info: Not present    Additional Factors Info  Code Status, Allergies Code Status Info: DNR Allergies Info: Ace Inhibitors, Ativan (Lorazepam), Compazine (Prochlorperazine Edisylate), Sumatriptan Succinate, Zofran (Ondansetron), Losartan, Prochlorperazine, Reglan (Metoclopramide), Scopolamine, Tape, Tapentadol, Ultrasound Gel           Current Medications (05/08/2021):  This is the current hospital active medication list Current Facility-Administered Medications  Medication Dose Route Frequency Provider Last Rate Last Admin   acetaminophen (TYLENOL) tablet 650 mg  650 mg Per Tube Q6H PRN Schnier, Dolores Lory, MD   650 mg at 05/02/21 1650   Or   acetaminophen (TYLENOL) suppository 650 mg  650 mg Rectal Q6H PRN Schnier, Dolores Lory, MD       albuterol (PROVENTIL) (2.5 MG/3ML) 0.083% nebulizer solution 2.5 mg  2.5 mg Inhalation Q4H PRN Schnier, Dolores Lory, MD   2.5 mg at 05/02/21 1650   ascorbic acid (VITAMIN C) tablet 500 mg  500 mg Oral BID Katha Cabal, MD   500 mg at 05/08/21 0809   atorvastatin (LIPITOR) tablet 20 mg  20 mg Per Tube QPM Schnier, Dolores Lory, MD   20 mg at 05/07/21 1828   calcium carbonate (TUMS - dosed in mg elemental calcium) chewable tablet 200 mg of elemental calcium  1 tablet Oral BID Colon Flattery, NP   200 mg of elemental calcium at 05/08/21 0810   chlorhexidine (PERIDEX) 0.12 % solution 15 mL  15 mL Mouth Rinse BID Schnier, Dolores Lory, MD   15 mL at 05/08/21 6387   Chlorhexidine Gluconate Cloth 2 % PADS 6 each  6 each Topical Q0600 Katha Cabal, MD   6 each at 05/08/21 [id number]   cholecalciferol  (VITAMIN D3) tablet 1,000 Units  1,000 Units Per Tube Daily Schnier, Dolores Lory, MD   1,000 Units at 05/08/21 3295   ciprofloxacin (CIPRO) tablet 500 mg  500 mg Oral Q24H Wouk, Ailene Rud, MD       dextrose 50 % solution 50 mL  1 ampule Intravenous Q1H PRN Katha Cabal, MD   50 mL at 04/26/21 1830   diphenhydrAMINE (BENADRYL) capsule 25 mg  25 mg Oral Q6H PRN Katha Cabal, MD   25 mg at 05/08/21 0502   docusate sodium (COLACE) capsule 100 mg  100 mg Oral BID PRN Schnier, Dolores Lory, MD       feeding supplement (BOOST / RESOURCE BREEZE) liquid 1 Container  1 Container Oral TID BM Wouk, Ailene Rud, MD       fentaNYL ([place]) 25 MCG/HR 1 patch  1 patch Transdermal Q72H Schnier, Dolores Lory, MD   1 patch at 05/08/21 1359   fentaNYL (SUBLIMAZE) injection 25-50 mcg  25-50 mcg Intravenous Q2H PRN Schnier, Dolores Lory, MD   25 mcg at 04/29/21 0906   heparin injection 5,000 Units  5,000 Units Subcutaneous Q8H Schnier, Dolores Lory, MD   5,000 Units at 05/08/21 1356   heparin sodium (porcine) injection 1,000-6,000 Units  1,000-6,000 Units Intracatheter PRN Katha Cabal, MD   4,200 Units at 05/08/21 1200   lipase/protease/amylase (CREON) capsule 36,000 Units  36,000 Units Oral TID AC Schnier, Dolores Lory, MD   36,000 Units at 05/08/21 1401   loperamide (IMODIUM) capsule 2 mg  2 mg Oral BID Schnier, Dolores Lory, MD   2 mg at 05/08/21 0809   MEDLINE mouth rinse  15 mL Mouth Rinse q12n4p Schnier, Dolores Lory, MD   15 mL at 05/07/21 1415   midodrine (PROAMATINE) tablet 10 mg  10 mg Per Tube TID WC Schnier, Dolores Lory, MD   10 mg at 05/07/21 1828   mirtazapine (REMERON) tablet 7.5 mg  7.5 mg Per Tube QHS Schnier, Dolores Lory, MD   7.5 mg at 05/07/21 2239   multivitamin (RENA-VIT) tablet 1 tablet  1 tablet Per Tube QHS Schnier, Dolores Lory, MD   1 tablet at 05/07/21 2238   oxyCODONE-acetaminophen (PERCOCET/ROXICET) 5-325 MG per tablet 1 tablet  1 tablet Oral Q6H PRN Schnier, Dolores Lory, MD   1 tablet at 05/08/21  0502   polyvinyl alcohol (LIQUIFILM TEARS) 1.4 % ophthalmic solution 1 drop  1 drop Both Eyes PRN Schnier, Dolores Lory, MD   1 drop at 05/04/21 2128   QUEtiapine (SEROQUEL) tablet 25 mg  25 mg Per Tube BID Schnier, Dolores Lory, MD   25 mg at 05/08/21 0809   silver sulfADIAZINE (SILVADENE) 1 % cream   Topical BID Schnier, Belenda Cruise  G, MD   Given at 05/08/21 8251   vitamin B-12 (CYANOCOBALAMIN) tablet 1,000 mcg  1,000 mcg Per Tube Daily Schnier, Dolores Lory, MD   1,000 mcg at 05/08/21 0809     Discharge Medications: Please see discharge summary for a list of discharge medications.  Relevant Imaging Results:  Relevant Lab Results:   Additional Information SSN:[id number]  Gerrianne Scale Robin Nancarrow, LCSW

## 2021-05-08 NOTE — Progress Notes (Signed)
PROGRESS NOTE    [name]  DSK:[id number] DOB: 14-Oct-1960 DOA: 04/25/2021 PCP: Denton Lank, MD    Brief Narrative:  [name] is a 60 y.o. female with medical history significant for end-stage renal disease on hemodialysis on Monday, Wednesday, Friday schedule, diabetes mellitus complicated by peripheral polyneuropathy and gastroparesis,, hyperlipidemia, anemia of chronic kidney disease with baseline hemoglobin 8-10, hypertension, who is admitted to [facility] on 04/25/2021 with severe sepsis after presenting from home to [facility] ED complaining of fever.  Patient was diagnosed with septic shock, blood culture positive for Acinetobacter calcoaceticus.  This appears to be secondary to dialysis catheter infection.  Patient also developed acute hypoxemic respiratory failure required mechanical ventilation. Permacath was removed on 8/06.  Patient treated with Unasyn, last dose 8/20.   Assessment & Plan:   Principal Problem:   Severe sepsis with septic shock (HCC) Active Problems:   Diabetes mellitus, type II ([place])   Hypoglycemia associated with diabetes ([place])   Generalized weakness   Acute hypoxemic respiratory failure (HCC)   Pressure ulcer, stage 2 (HCC)   ESRD (end stage renal disease) (HCC)   GERD (gastroesophageal reflux disease)   Acute metabolic encephalopathy   HCAP (healthcare-associated pneumonia)   Anemia of chronic kidney failure   Thrombocytopenia (HCC)    Septic shock with Acinetobacter septicemia secondary to dialysis catheter infection. Acute hypoxemic respite failure secondary to septic shock. Permacath infection Patient condition has improved, is on baseline prn o2 Last dose antibiotics is 8/20. Pt declines IV; pharm discussed with ID and ok to complete abx course w/ oral cipro Patient desires rehab, have communicated with pt and ot need for eval, also have relayed this to social work  End-stage renal  disease. Hypophosphatemia. Hyponatremia. Permacath replaced. Dialysis today, tolerated.   CAD Cont home atorvastatin  Chronic pain Cont home fentanyl patch    DVT prophylaxis: Heparin Code Status: DNR Family Communication:  Disposition Plan:    Status is: Inpatient  Remains inpatient appropriate because:Unsafe d/c plan and Inpatient level of care appropriate due to severity of illness  Dispo: The patient is from: Home              Anticipated d/c is to: snf, sw working on bed, pt prefers [facility]              Patient currently is not medically stable to d/c.   Difficult to place patient No        No intake/output data recorded. Total I/O In: -  Out: 1500 [Other:1500]     Consultants:  Nephrology  Procedures: Permcath  Antimicrobials: ciprofloxacin. Subjective: Patient doing well today, tolerated dialysis, has appetite, denies pain  Objective: Vitals:   05/08/21 1130 05/08/21 1145 05/08/21 1200 05/08/21 1205  BP: (!) 164/70 (!) 159/64 (!) 156/65 (!) 166/69  Pulse:      Resp:      Temp:      TempSrc:      SpO2: 100%   98%  Weight:      Height:        Intake/Output Summary (Last 24 hours) at 05/08/2021 1553 Last data filed at 05/08/2021 1200 Gross per 24 hour  Intake --  Output 1500 ml  Net -1500 ml   Filed Weights   05/06/21 0443 05/07/21 0524 05/08/21 0500  Weight: 77.4 kg 77.1 kg 78.2 kg    Examination:  General exam: Appears calm and comfortable. Chronically ill appearing Respiratory system: Clear to auscultation. Respiratory effort normal.  Cardiovascular system: S1 & S2 heard, RRR. No JVD, murmurs, rubs, gallops or clicks. Trace pedal edema Gastrointestinal system: Abdomen is nondistended, soft and nontender. No organomegaly or masses felt. Normal bowel sounds heard. Central nervous system: Alert and oriented x3. No focal neurological deficits. Extremities: Symmetric 5 x 5 power. Skin: No rashes, lesions or ulcers Psychiatry:  Judgement and insight appear normal. Mood & affect appropriate.     Data Reviewed: I have personally reviewed following labs and imaging studies  CBC: Recent Labs  Lab 05/08/21 1105  WBC 6.2  HGB 8.1*  HCT 25.4*  MCV 87.0  PLT 350   Basic Metabolic Panel: Recent Labs  Lab 05/02/21 0540 05/05/21 0607 05/06/21 0559 05/07/21 0718 05/08/21 0454  NA 135 133* 134* 132* 135  K 3.9 4.3 4.7 3.9 4.2  CL 98 96* 96* 95* 98  CO2 32 29 28 26 28   GLUCOSE 161* 112* 141* 78 128*  BUN 14 36* 46* 28* 35*  CREATININE 1.80* 3.22* 4.04* 3.14* 3.93*  CALCIUM 8.1* 8.4* 8.4* 7.8* 7.9*  MG 2.1 2.2 2.2 2.1 2.1  PHOS 1.9* 4.0 4.9* 3.7 4.9*   GFR: Estimated Creatinine Clearance: 15.5 mL/min (A) (by C-G formula based on SCr of 3.93 mg/dL (H)). Liver Function Tests: Recent Labs  Lab 05/02/21 0540 05/05/21 0607 05/06/21 0559 05/07/21 0718 05/08/21 0454  ALBUMIN 2.6* 2.3* 2.5* 2.1* 2.1*   No results for input(s): LIPASE, AMYLASE in the last 168 hours. No results for input(s): AMMONIA in the last 168 hours. Coagulation Profile: No results for input(s): INR, PROTIME in the last 168 hours. Cardiac Enzymes: No results for input(s): CKTOTAL, CKMB, CKMBINDEX, TROPONINI in the last 168 hours. BNP (last 3 results) No results for input(s): PROBNP in the last 8760 hours. HbA1C: No results for input(s): HGBA1C in the last 72 hours. CBG: Recent Labs  Lab 05/07/21 1644 05/07/21 2004 05/08/21 0441 05/08/21 0819 05/08/21 1528  GLUCAP 116* 89 138* 118* 113*   Lipid Profile: No results for input(s): CHOL, HDL, LDLCALC, TRIG, CHOLHDL, LDLDIRECT in the last 72 hours. Thyroid Function Tests: No results for input(s): TSH, T4TOTAL, FREET4, T3FREE, THYROIDAB in the last 72 hours. Anemia Panel: No results for input(s): VITAMINB12, FOLATE, FERRITIN, TIBC, IRON, RETICCTPCT in the last 72 hours. Sepsis Labs: No results for input(s): PROCALCITON, LATICACIDVEN in the last 168 hours.  Recent Results  (from the past 240 hour(s))  CULTURE, BLOOD (ROUTINE X 2) w Reflex to ID Panel     Status: None   Collection Time: 04/28/21  5:27 PM   Specimen: BLOOD  Result Value Ref Range Status   Specimen Description BLOOD LT FINGER IN HAND  Final   Special Requests BOTTLES DRAWN AEROBIC ONLY  BCAV  Final   Culture   Final    NO GROWTH 5 DAYS Performed at [facility], [address], [place], [place] [address]    Report Status 05/03/2021 FINAL  Final         Radiology Studies: PERIPHERAL VASCULAR CATHETERIZATION  Result Date: 05/07/2021 See surgical note for result.       Scheduled Meds:  vitamin C  500 mg Oral BID   atorvastatin  20 mg Per Tube QPM   calcium carbonate  1 tablet Oral BID   chlorhexidine  15 mL Mouth Rinse BID   Chlorhexidine Gluconate Cloth  6 each Topical Q0600   cholecalciferol  1,000 Units Per Tube Daily   ciprofloxacin  500 mg Oral Q24H   feeding supplement  1 Container Oral TID BM   fentaNYL  1 patch Transdermal Q72H   heparin  5,000 Units Subcutaneous Q8H   lipase/protease/amylase  36,000 Units Oral TID AC   loperamide  2 mg Oral BID   mouth rinse  15 mL Mouth Rinse q12n4p   midodrine  10 mg Per Tube TID WC   mirtazapine  7.5 mg Per Tube QHS   multivitamin  1 tablet Per Tube QHS   QUEtiapine  25 mg Per Tube BID   silver sulfADIAZINE   Topical BID   cyanocobalamin  1,000 mcg Per Tube Daily   Continuous Infusions:   ceFAZolin (ANCEF) IV       LOS: 13 days    Time spent: 30 minutes    Desma Maxim, MD [facility]   To contact the attending provider between 7A-7P or the covering provider during after hours 7P-7A, please log into the web site [email or web address] and access using universal [place] password for that web site. If you do not have the password, please call the hospital operator.  05/08/2021, 3:53 PM

## 2021-05-08 NOTE — Progress Notes (Signed)
[facility]  ROUNDING NOTE   Subjective:   [name] was admitted to [facility] on 04/25/2021 for Sepsis [facility]) [A41.9] Severe sepsis ([place]) [A41.9, R65.20] Fever in other diseases [R50.81]  Extubated 04/28/2021 Patient seen sitting up in bed Eating breakfast Voicing frustration with family not wanting to honor her health care wishes. States she is tired and been through a lot. States she would be great if she had a full healthcare team to take home with her. Says her son has told her in the past that he couldn't do any since he helps take care of her. Her grandchild is scared to come near her with her arm being swollen and scars from dialysis and procedures. She says she knows this will only get worse. She states she does not want any further procedures.   Patient seen during dialysis   HEMODIALYSIS FLOWSHEET:  Blood Flow Rate (mL/min): 0 mL/min Arterial Pressure (mmHg): -160 mmHg Venous Pressure (mmHg): 120 mmHg Transmembrane Pressure (mmHg): 40 mmHg Ultrafiltration Rate (mL/min): 730 mL/min Dialysate Flow Rate (mL/min): 500 ml/min Conductivity: Machine : 13.5 Conductivity: Machine : 13.5 Dialysis Fluid Bolus: Normal Saline Bolus Amount (mL): 300 mL  States she feels well Denies shortness of breath Concerned about discharge plan   Objective:  Vital signs in last 24 hours:  Temp:  [98.2 F (36.8 C)-99.1 F (37.3 C)] 98.7 F (37.1 C) (08/17 0845) Pulse Rate:  [77-84] 81 (08/17 0823) Resp:  [12-22] 16 (08/17 1100) BP: (144-187)/(53-90) 166/69 (08/17 1205) SpO2:  [98 %-100 %] 98 % (08/17 1205) Weight:  [78.2 kg] 78.2 kg (08/17 0500)  Weight change: 1.1 kg Filed Weights   05/06/21 0443 05/07/21 0524 05/08/21 0500  Weight: 77.4 kg 77.1 kg 78.2 kg    Intake/Output: No intake/output data recorded.   Intake/Output this shift:  Total I/O In: -  Out: 1500 [Other:1500]  Physical Exam: General: NAD  Head: Normocephalic, atraumatic. Moist oral mucosal  membranes  Lungs:  Coarse breath sounds, normal breathing effort  Heart: Regular rate and rhythm  Abdomen:  Soft, nontender  Extremities: ++ peripheral edema.  Neurologic: Able to follow commands and answer simple questions  Skin: Bilateral foot wounds  Access:  Rt EJ Permcath placed on 05/07/21 by Schnier    Basic Metabolic Panel: Recent Labs  Lab 05/02/21 0540 05/05/21 0607 05/06/21 0559 05/07/21 0718 05/08/21 0454  NA 135 133* 134* 132* 135  K 3.9 4.3 4.7 3.9 4.2  CL 98 96* 96* 95* 98  CO2 32 29 28 26 28   GLUCOSE 161* 112* 141* 78 128*  BUN 14 36* 46* 28* 35*  CREATININE 1.80* 3.22* 4.04* 3.14* 3.93*  CALCIUM 8.1* 8.4* 8.4* 7.8* 7.9*  MG 2.1 2.2 2.2 2.1 2.1  PHOS 1.9* 4.0 4.9* 3.7 4.9*     Liver Function Tests: Recent Labs  Lab 05/02/21 0540 05/05/21 0607 05/06/21 0559 05/07/21 0718 05/08/21 0454  ALBUMIN 2.6* 2.3* 2.5* 2.1* 2.1*    No results for input(s): LIPASE, AMYLASE in the last 168 hours.  No results for input(s): AMMONIA in the last 168 hours.   CBC: Recent Labs  Lab 05/08/21 1105  WBC 6.2  HGB 8.1*  HCT 25.4*  MCV 87.0  PLT 180     Cardiac Enzymes: No results for input(s): CKTOTAL, CKMB, CKMBINDEX, TROPONINI in the last 168 hours.  BNP: Invalid input(s): POCBNP  CBG: Recent Labs  Lab 05/07/21 1211 05/07/21 1644 05/07/21 2004 05/08/21 0441 05/08/21 0819  GLUCAP 91 116* 89 138*  13*     Microbiology: Results for orders placed or performed during the hospital encounter of 04/25/21  Culture, blood (Routine x 2)     Status: Abnormal   Collection Time: 04/25/21  4:51 PM   Specimen: BLOOD  Result Value Ref Range Status   Specimen Description   Final    BLOOD LW Performed at [facility], [address] [place], [place] [address]    Special Requests   Final    BOTTLES DRAWN AEROBIC AND ANAEROBIC Blood Culture adequate volume Performed at [facility], [address] [place], [place] [address]     Culture  Setup Time   Final    GRAM VARIABLE COCCOBACILLI AEROBIC BOTTLE ONLY Organism ID to follow CRITICAL RESULT CALLED TO, READ BACK BY AND VERIFIED WITH: [facility] AT 8416 04/26/21.PMF Performed at [facility], [address] [place], [place] [address]    Culture ACINETOBACTER CALCOACETICUS/BAUMANNII COMPLEX (A)  Final   Report Status 04/28/2021 FINAL  Final   Organism ID, Bacteria ACINETOBACTER CALCOACETICUS/BAUMANNII COMPLEX  Final      Susceptibility   Acinetobacter calcoaceticus/baumannii complex - MIC*    CEFTAZIDIME 16 INTERMEDIATE Intermediate     CIPROFLOXACIN <=0.25 SENSITIVE Sensitive     GENTAMICIN <=1 SENSITIVE Sensitive     IMIPENEM <=0.25 SENSITIVE Sensitive     PIP/TAZO 32 INTERMEDIATE Intermediate     TRIMETH/SULFA <=20 SENSITIVE Sensitive     AMPICILLIN/SULBACTAM <=2 SENSITIVE Sensitive     * ACINETOBACTER CALCOACETICUS/BAUMANNII COMPLEX  Blood Culture ID Panel (Reflexed)     Status: Abnormal   Collection Time: 04/25/21  4:51 PM  Result Value Ref Range Status   Enterococcus faecalis NOT DETECTED NOT DETECTED Final   Enterococcus Faecium NOT DETECTED NOT DETECTED Final   Listeria monocytogenes NOT DETECTED NOT DETECTED Final   Staphylococcus species NOT DETECTED NOT DETECTED Final   Staphylococcus aureus (BCID) NOT DETECTED NOT DETECTED Final   Staphylococcus epidermidis NOT DETECTED NOT DETECTED Final   Staphylococcus lugdunensis NOT DETECTED NOT DETECTED Final   Streptococcus species NOT DETECTED NOT DETECTED Final   Streptococcus agalactiae NOT DETECTED NOT DETECTED Final   Streptococcus pneumoniae NOT DETECTED NOT DETECTED Final   Streptococcus pyogenes NOT DETECTED NOT DETECTED Final   A.calcoaceticus-baumannii DETECTED (A) NOT DETECTED Final    Comment: CRITICAL RESULT CALLED TO, READ BACK BY AND VERIFIED WITH: CHARLES SHANLEVER AT 1014 04/26/21.PMF    Bacteroides fragilis NOT DETECTED NOT DETECTED Final   Enterobacterales NOT DETECTED  NOT DETECTED Final   Enterobacter cloacae complex NOT DETECTED NOT DETECTED Final   Escherichia coli NOT DETECTED NOT DETECTED Final   Klebsiella aerogenes NOT DETECTED NOT DETECTED Final   Klebsiella oxytoca NOT DETECTED NOT DETECTED Final   Klebsiella pneumoniae NOT DETECTED NOT DETECTED Final   Proteus species NOT DETECTED NOT DETECTED Final   Salmonella species NOT DETECTED NOT DETECTED Final   Serratia marcescens NOT DETECTED NOT DETECTED Final   Haemophilus influenzae NOT DETECTED NOT DETECTED Final   Neisseria meningitidis NOT DETECTED NOT DETECTED Final   Pseudomonas aeruginosa NOT DETECTED NOT DETECTED Final   Stenotrophomonas maltophilia NOT DETECTED NOT DETECTED Final   Candida albicans NOT DETECTED NOT DETECTED Final   Candida auris NOT DETECTED NOT DETECTED Final   Candida glabrata NOT DETECTED NOT DETECTED Final   Candida krusei NOT DETECTED NOT DETECTED Final   Candida parapsilosis NOT DETECTED NOT DETECTED Final   Candida tropicalis NOT DETECTED NOT DETECTED Final   Cryptococcus neoformans/gattii NOT DETECTED  NOT DETECTED Final   CTX-M ESBL NOT DETECTED NOT DETECTED Final   Carbapenem resistance IMP NOT DETECTED NOT DETECTED Final   Carbapenem resistance KPC NOT DETECTED NOT DETECTED Final   Carbapenem resistance NDM NOT DETECTED NOT DETECTED Final   Carbapenem resistance VIM NOT DETECTED NOT DETECTED Final    Comment: Performed at [facility], [address] [place], [place] [address]  Culture, blood (Routine x 2)     Status: Abnormal   Collection Time: 04/25/21  6:38 PM   Specimen: BLOOD  Result Value Ref Range Status   Specimen Description   Final    BLOOD BLOOD LEFT WRIST Performed at [facility], [address], [place], [place] [address]    Special Requests   Final    BOTTLES DRAWN AEROBIC AND ANAEROBIC Blood Culture adequate volume Performed at [facility], [address] [place], [place] [address]    Culture  Setup Time    Final    GRAM VARIABLE COCCOBACILLI AEROBIC BOTTLE ONLY CRITICAL VALUE NOTED.  VALUE IS CONSISTENT WITH PREVIOUSLY REPORTED AND CALLED VALUE. Performed at [facility], [address] [place], [place] [address]    Culture (A)  Final    ACINETOBACTER CALCOACETICUS/BAUMANNII COMPLEX SUSCEPTIBILITIES PERFORMED ON PREVIOUS CULTURE WITHIN THE LAST 5 DAYS. Performed at [facility], [place] [address], [place], [place] [address]    Report Status 04/28/2021 FINAL  Final  Resp Panel by RT-PCR (Flu A&B, Covid) Nasopharyngeal Swab     Status: None   Collection Time: 04/25/21  6:54 PM   Specimen: Nasopharyngeal Swab; Nasopharyngeal(NP) swabs in vial transport medium  Result Value Ref Range Status   SARS Coronavirus 2 by RT PCR NEGATIVE NEGATIVE Final    Comment: (NOTE) SARS-CoV-2 target nucleic acids are NOT DETECTED.  The SARS-CoV-2 RNA is generally detectable in upper respiratory specimens during the acute phase of infection. The lowest concentration of SARS-CoV-2 viral copies this assay can detect is 138 copies/mL. A negative result does not preclude SARS-Cov-2 infection and should not be used as the sole basis for treatment or other patient management decisions. A negative result may occur with  improper specimen collection/handling, submission of specimen other than nasopharyngeal swab, presence of viral mutation(s) within the areas targeted by this assay, and inadequate number of viral copies(<138 copies/mL). A negative result must be combined with clinical observations, patient history, and epidemiological information. The expected result is Negative.  Fact Sheet for Patients:  EntrepreneurPulse.com.au  Fact Sheet for Healthcare Providers:  IncredibleEmployment.be  This test is no t yet approved or cleared by the [place] FDA and  has been authorized for detection and/or diagnosis of SARS-CoV-2 by FDA under an Emergency Use  Authorization (EUA). This EUA will remain  in effect (meaning this test can be used) for the duration of the COVID-19 declaration under Section 564(b)(1) of the Act, 21 U.S.C.section 360bbb-3(b)(1), unless the authorization is terminated  or revoked sooner.       Influenza A by PCR NEGATIVE NEGATIVE Final   Influenza B by PCR NEGATIVE NEGATIVE Final    Comment: (NOTE) The Xpert Xpress SARS-CoV-2/FLU/RSV plus assay is intended as an aid in the diagnosis of influenza from Nasopharyngeal swab specimens and should not be used as a sole basis for treatment. Nasal washings and aspirates are unacceptable for Xpert Xpress SARS-CoV-2/FLU/RSV testing.  Fact Sheet for Patients: EntrepreneurPulse.com.au  Fact Sheet for Healthcare Providers: IncredibleEmployment.be  This test is not yet approved or cleared by the  [place] Architectural technologist and has been authorized for detection and/or diagnosis of SARS-CoV-2 by FDA under an Print production planner (EUA). This EUA will remain in effect (meaning this test can be used) for the duration of the COVID-19 declaration under Section 564(b)(1) of the Act, 21 U.S.C. section 360bbb-3(b)(1), unless the authorization is terminated or revoked.  Performed at [facility], [address] [place], [place] [address]   MRSA Next Gen by PCR, Nasal     Status: None   Collection Time: 04/26/21  6:35 PM   Specimen: Nasal Mucosa; Nasal Swab  Result Value Ref Range Status   MRSA by PCR Next Gen NOT DETECTED NOT DETECTED Final    Comment: (NOTE) The GeneXpert MRSA Assay (FDA approved for NASAL specimens only), is one component of a comprehensive MRSA colonization surveillance program. It is not intended to diagnose MRSA infection nor to guide or monitor treatment for MRSA infections. Test performance is not FDA approved in patients less than 78 years old. Performed at [facility], [address],  [place], [place] [address]   Cath Tip Culture     Status: None   Collection Time: 04/27/21 11:59 AM   Specimen: Catheter Tip; Other  Result Value Ref Range Status   Specimen Description   Final    CATH TIP Performed at [facility], [address], [place], [place] [address]    Special Requests   Final    NONE Performed at [facility], [address], [place], [place] [address]    Culture   Final    NO GROWTH Performed at [facility], [place] [name] [place], [place] [address]    Report Status 05/01/2021 FINAL  Final  CULTURE, BLOOD (ROUTINE X 2) w Reflex to ID Panel     Status: None   Collection Time: 04/28/21 11:34 AM   Specimen: BLOOD  Result Value Ref Range Status   Specimen Description BLOOD RIGHT AC  Final   Special Requests   Final    BOTTLES DRAWN AEROBIC ONLY Blood Culture results may not be optimal due to an inadequate volume of blood received in culture bottles   Culture   Final    NO GROWTH 5 DAYS Performed at [facility], [address] [place], [place] [address]    Report Status 05/03/2021 FINAL  Final  CULTURE, BLOOD (ROUTINE X 2) w Reflex to ID Panel     Status: None   Collection Time: 04/28/21  5:27 PM   Specimen: BLOOD  Result Value Ref Range Status   Specimen Description BLOOD LT FINGER IN HAND  Final   Special Requests BOTTLES DRAWN AEROBIC ONLY  BCAV  Final   Culture   Final    NO GROWTH 5 DAYS Performed at [facility], [address], [place], [place] [address]    Report Status 05/03/2021 FINAL  Final    Coagulation Studies: No results for input(s): LABPROT, INR in the last 72 hours.   Urinalysis: No results for input(s): COLORURINE, LABSPEC, PHURINE, GLUCOSEU, HGBUR, BILIRUBINUR, KETONESUR, PROTEINUR, UROBILINOGEN, NITRITE, LEUKOCYTESUR in the last 72 hours.  Invalid input(s): APPERANCEUR    Imaging: PERIPHERAL VASCULAR CATHETERIZATION  Result Date: 05/07/2021 See surgical note for result.     Medications:    ampicillin-sulbactam (UNASYN) IV 3 g (05/07/21 2259)    ceFAZolin (ANCEF) IV      vitamin C  500 mg Oral BID   atorvastatin  20 mg Per Tube QPM  calcium carbonate  1 tablet Oral BID   chlorhexidine  15 mL Mouth Rinse BID   Chlorhexidine Gluconate Cloth  6 each Topical Q0600   cholecalciferol  1,000 Units Per Tube Daily   feeding supplement (NEPRO CARB STEADY)  237 mL Oral BID BM   fentaNYL  1 patch Transdermal Q72H   heparin  5,000 Units Subcutaneous Q8H   lipase/protease/amylase  36,000 Units Oral TID AC   loperamide  2 mg Oral BID   mouth rinse  15 mL Mouth Rinse q12n4p   midodrine  10 mg Per Tube TID WC   mirtazapine  7.5 mg Per Tube QHS   multivitamin  1 tablet Per Tube QHS   QUEtiapine  25 mg Per Tube BID   silver sulfADIAZINE   Topical BID   cyanocobalamin  1,000 mcg Per Tube Daily   acetaminophen **OR** acetaminophen, albuterol, dextrose, diphenhydrAMINE, docusate sodium, fentaNYL (SUBLIMAZE) injection, heparin sodium (porcine), oxyCODONE-acetaminophen, polyvinyl alcohol  Assessment/ Plan:  [name] is a 60 y.o. black female with end stage renal disease on hemodialysis for greater than 14 years, hypertension, congestive heart failure, coronary artery disease, diabetes mellitus type II, who is admitted to [facility] on 04/25/2021 for Sepsis ([place]) [A41.9] Severe sepsis ([place]) [A41.9, R65.20] Fever in other diseases [R50.81]   [facility] MWF Lt Permcath 74kg   End Stage Renal Disease on dialysis: CRRT and pressors while in ICU;    Received dialysis today, tolerated well. UF 1.5L removed Next dialysis scheduled for Saturday  2. Sepsis: with hypotension and fever.  - permcath removed, tip cultures are neg - Remains afebrile -  Acinetobacter bacteremia     3. Anemia of chronic kidney disease  Lab Results  Component Value Date   HGB 8.1 (L) 05/08/2021   Hgb below target, will continue to monitor Epo 10000 unit with dialysis today   4.  Secondary Hyperparathyroidism:    Lab Results  Component Value Date   PTH 357 (H) 10/05/2018   CALCIUM 7.9 (L) 05/08/2021   CAION 1.09 (L) 04/13/2020   PHOS 4.9 (H) 05/08/2021    Calcium remains below target, and phosphorus elevated Calcium carbonate BID  5.  Hypokalemia Stable  6. Volume overload - Will manage with dialysis    LOS: 13   8/17/20222:03 PM

## 2021-05-08 NOTE — Progress Notes (Signed)
Occupational Therapy Treatment Patient Details Name: [name] MRN: [id number] DOB: 05-07-1961 Today's Date: 05/08/2021    History of present illness Pt is a 60 y/o F with PMH: ESRD on HD MWF, DM complicated by peripheral polyneuropathy and gastroparesis, chronic dHF, CAD, and PVD who presented to [facility] d/t fever. Pt adm for septic shock. This appears to be secondary to dialysis catheter infection.  Patient also developed acute hypoxemic respiratory failure required mechanical ventilation. Extub 8/7, out of ICU 8/9.   OT comments  Pt seen for OT tx this date to f/u re: safety with ADLs/ADL mobility. Pt only minimally agreeable with max encouragement to participate on some level. Becomes increasingly agitated with gentle cues for sequence and any hands on assistance required. Pt comes to sitting with MAX A and demos P to F balance with static sitting. Limited tolerance for sitting and c/o dizziness. Pt BP checked and stable yet elevated. RN notified of pt BP, nausea and c/o R UE pain. Pt left in bed with all needs met and in reach. Emesis bag provided. Will continue to follow. Limited progress towards goals and pt also self-limiting this session. Will re-assess OT goals next session.   Follow Up Recommendations  SNF    Equipment Recommendations  Other (comment) (defer to next level of care)    Recommendations for Other Services      Precautions / Restrictions Precautions Precautions: Fall Restrictions Weight Bearing Restrictions: No       Mobility Bed Mobility Overal bed mobility: Needs Assistance Bed Mobility: Sit to Supine;Supine to Sit     Supine to sit: Max assist;HOB elevated Sit to supine: Max assist;Total assist   General bed mobility comments: increased time, pt reluctant to participate on any level and essentially only motivated to minimally participate d/t MD intervening to encourage participation.    Transfers                 General transfer comment:  deferred, pt refused    Balance Overall balance assessment: Needs assistance Sitting-balance support: Feet supported;Bilateral upper extremity supported Sitting balance-Leahy Scale: Fair Sitting balance - Comments: F initially, but eventually decrease to P with pt c/o nausea. BP checked and elevated. Pt assisted back to bed.                                   ADL either performed or assessed with clinical judgement   ADL                                               Vision Patient Visual Report: No change from baseline     Perception     Praxis      Cognition Arousal/Alertness: Awake/alert Behavior During Therapy: WFL for tasks assessed/performed;Agitated                                   General Comments: HOH, follows most simple one step commands with increased time. Agitated for much of the session with very minimal communication with therapist.        Exercises Other Exercises Other Exercises: Ed re: importance of elevating R UE on pillows if she notices edema as she c/o pain in R shoulder after dialysis (  port in R upper chest)   Shoulder Instructions       General Comments      Pertinent Vitals/ Pain       Pain Assessment: Faces Faces Pain Scale: Hurts little more Pain Location: nausea/belly ache, R shoulder pain after dialysis Pain Descriptors / Indicators: Discomfort Pain Intervention(s): Monitored during session;Other (comment);Limited activity within patient's tolerance (called and notified RN about nausea and pt requesting nausea medicine. Attempted to assist with repositioning R UE for comfort, but pt refuses.)  Home Living                                          Prior Functioning/Environment              Frequency  Min 1X/week        Progress Toward Goals  OT Goals(current goals can now be found in the care plan section)  Progress towards OT goals: Not progressing toward  goals - comment (limited ability to measure pt progress towards OT goals today as pt self-limiting and only minimall participatory with maximal encouragement.)  Acute Rehab OT Goals Patient Stated Goal: to get some nausea medicine. OT Goal Formulation: With patient Time For Goal Achievement: 05/17/21 Potential to Achieve Goals: Good  Plan Discharge plan remains appropriate    Co-evaluation                 AM-PAC OT "6 Clicks" Daily Activity     Outcome Measure   Help from another person eating meals?: A Little Help from another person taking care of personal grooming?: A Lot Help from another person toileting, which includes using toliet, bedpan, or urinal?: A Lot Help from another person bathing (including washing, rinsing, drying)?: A Lot Help from another person to put on and taking off regular upper body clothing?: A Lot Help from another person to put on and taking off regular lower body clothing?: Total 6 Click Score: 12    End of Session Equipment Utilized During Treatment: Gait belt;Rolling walker;Oxygen  OT Visit Diagnosis: Unsteadiness on feet (R26.81);Muscle weakness (generalized) (M62.81)   Activity Tolerance Patient tolerated treatment well   Patient Left in bed;with call bell/phone within reach;with bed alarm set;with family/visitor present   Nurse Communication Other (comment) (nausea and c/o R UE pain)        Time: 4481-8563 OT Time Calculation (min): 9 min  Charges: OT General Charges $OT Visit: 1 Visit OT Treatments $Therapeutic Activity: 8-22 mins  Gerrianne Scale, MS, OTR/L ascom [phone] 05/08/21, 5:15 PM

## 2021-05-08 NOTE — TOC Initial Note (Signed)
Transition of Care [facility]) - Initial/Assessment Note    Patient Details  Name: [name] MRN: [id number] Date of Birth: 1961-04-21  Transition of Care [facility]) CM/SW Contact:    Eileen Stanford, LCSW Phone Number: 05/08/2021, 4:04 PM  Clinical Narrative: PT is recommending SNF. Per MD and RN pt is preferring [facility]. CSW has sent the referral at this time.                  Expected Discharge Plan: Skilled Nursing Facility Barriers to Discharge: Continued Medical Work up   Patient Goals and CMS Choice     Choice offered to / list presented to : Patient  Expected Discharge Plan and Services Expected Discharge Plan: [place] In-house Referral: NA   Post Acute Care Choice: [place] Living arrangements for the past 2 months: Single Family Home                                      Prior Living Arrangements/Services Living arrangements for the past 2 months: Single Family Home Lives with:: Self Patient language and need for interpreter reviewed:: Yes        Need for Family Participation in Patient Care: Yes (Comment) Care giver support system in place?: Yes (comment)   Criminal Activity/Legal Involvement Pertinent to Current Situation/Hospitalization: No - Comment as needed  Activities of Daily Living Home Assistive Devices/Equipment: None ADL Screening (condition at time of admission) Patient's cognitive ability adequate to safely complete daily activities?: Yes Is the patient deaf or have difficulty hearing?: Yes Does the patient have difficulty seeing, even when wearing glasses/contacts?: No Does the patient have difficulty concentrating, remembering, or making decisions?: No Patient able to express need for assistance with ADLs?: Yes Does the patient have difficulty dressing or bathing?: Yes Independently performs ADLs?: No Communication: Independent Dressing (OT): Needs assistance Is this a change from baseline?:  Pre-admission baseline Grooming: Needs assistance Is this a change from baseline?: Pre-admission baseline Feeding: Independent Bathing: Needs assistance Is this a change from baseline?: Pre-admission baseline Toileting: Needs assistance Is this a change from baseline?: Pre-admission baseline In/Out Bed: Needs assistance Is this a change from baseline?: Pre-admission baseline Walks in Home: Needs assistance Does the patient have difficulty walking or climbing stairs?: Yes Weakness of Legs: Both Weakness of Arms/Hands: None  Permission Sought/Granted                  Emotional Assessment Appearance:: Appears stated age     Orientation: : Oriented to Self, Oriented to  Time, Oriented to Place, Oriented to Situation Alcohol / Substance Use: Not Applicable Psych Involvement: No (comment)  Admission diagnosis:  Sepsis ([place]) [A41.9] Severe sepsis ([place]) [A41.9, R65.20] Fever in other diseases [R50.81] Patient Active Problem List   Diagnosis Date Noted   Anemia of chronic kidney failure 05/03/2021   Thrombocytopenia ([place]) 05/03/2021   Severe sepsis with septic shock ([place]) 04/25/2021   HCAP (healthcare-associated pneumonia) 95/18/8416   Acute metabolic encephalopathy 60/63/0160   UTI (urinary tract infection) 03/29/2021   Chronic respiratory failure with hypoxia ([place]) 12/18/2020   Superior vena caval syndrome 12/14/2020   Chronic diastolic CHF (congestive heart failure) ([place]) 12/04/2020   Bradycardia 09/17/2020   Advanced care planning/counseling discussion    Discitis of thoracic region    Fluid overload 08/10/2020   HLD (hyperlipidemia) 08/10/2020   Chest pain 08/10/2020   CAD (coronary artery  disease) 08/10/2020   GERD (gastroesophageal reflux disease) 08/10/2020   Obesity (BMI 30-39.9) 06/06/2020   Anemia in ESRD (end-stage renal disease) ([place]) 05/31/2020   Type 1 diabetes mellitus ([place]) 05/31/2020   End-stage renal disease ([place]) 05/31/2020   Hypertensive disorder  05/31/2020   Arm pain 05/21/2020   Cellulitis of right leg 05/02/2020   ESRD (end stage renal disease) ([place]) 04/13/2020   SOBOE (shortness of breath on exertion) 02/08/2020   Hematoma of arm, left, subsequent encounter 11/24/2019   Respiratory failure (HCC)    Shortness of breath    Complication of arteriovenous dialysis fistula 11/13/2019   PVD (peripheral vascular disease) ([place]) 11/13/2019   Severe anemia 11/13/2019   Pressure ulcer, stage 2 ([place]) 11/13/2019   ESRD on hemodialysis ([place])    Hemorrhagic shock ([place]) 11/11/2019   Leg pain 10/02/2019   CAP (community acquired pneumonia) 10/01/2019   Atherosclerosis of native arteries of extremity with intermittent claudication ([place]) 05/01/2019   Right-sided headache    COPD (chronic obstructive pulmonary disease) ([place]) 10/06/2018   Syncope 10/04/2018   Symptomatic anemia 06/18/2018   Gastroparesis due to DM ([place]) 29/52/8413   Complication of vascular access for dialysis 12/04/2017   Osteomyelitis ([place]) 09/30/2017   Carotid stenosis 06/18/2017   Bilateral carotid artery stenosis 06/16/2017   Benign essential HTN 05/19/2017   CKD (chronic kidney disease) stage 5, GFR less than 15 ml/min (HCC) 05/19/2017   Hyperlipidemia, mixed 05/19/2017   Shortness of breath 05/04/2017   Rash, skin 12/16/2016   Cellulitis of lower extremity 07/29/2016   Chronic venous insufficiency 07/29/2016   Lymphedema 07/29/2016   TIA (transient ischemic attack) 04/21/2016   Altered mental status 04/08/2016   Hyperammonemia ([place]) 04/08/2016   Elevated troponin 04/08/2016   Depression 04/08/2016   Depression, major, recurrent, severe with psychosis ([place]) 04/08/2016   Blood in stool    Intractable cyclical vomiting with nausea    Reflux esophagitis    Gastritis    Generalized abdominal pain    Uncontrollable vomiting    Major depressive disorder, recurrent episode, moderate ([place]) 03/15/2016   Adjustment disorder with mixed anxiety and depressed mood  03/15/2016   Malnutrition of moderate degree 12/01/2015   Renal mass    Dyspnea    Acute renal failure ([place])    Respiratory failure ([place])    High temperature 11/14/2015   Encounter for central line placement    Encounter for orogastric (OG) tube placement    Nausea 11/12/2015   Hyperkalemia 10/03/2015   Diarrhea, unspecified 07/22/2015   Pneumonia 05/21/2015   Hypoglycemia 04/24/2015   Unresponsiveness 04/24/2015   Sinus bradycardia 04/24/2015   Hypothermia 04/24/2015   Acute hypoxemic respiratory failure ([place]) 04/24/2015   Acute on chronic diastolic CHF (congestive heart failure) ([place]) 04/05/2015   Diabetic gastroparesis ([place]) 04/05/2015   Hypokalemia 04/05/2015   Generalized weakness 04/05/2015   Acute pulmonary edema (HCC) 04/03/2015   Nausea and vomiting 04/03/2015   Hypoglycemia associated with diabetes ([place]) 04/03/2015   Anemia of chronic disease 04/03/2015   Secondary hyperparathyroidism ([place]) 04/03/2015   Pressure ulcer 04/02/2015   Acute respiratory failure with hypoxia ([place]) 04/01/2015   Adjustment disorder with anxiety 03/14/2015   Somatic symptom disorder, mild 03/08/2015   Coronary artery disease involving native coronary artery of native heart without angina pectoris    Nausea & vomiting 03/06/2015   Abdominal pain 03/06/2015   Diabetes mellitus, type II ([place]) 03/06/2015   HTN (hypertension) 03/06/2015   Gastroparesis 02/24/2015   Pleural effusion 02/19/2015  Bacteremia due to Enterococcus 02/19/2015   End-stage renal disease on hemodialysis ([place]) 02/19/2015   Diarrhea 08/01/2014   PCP:  Denton Lank, MD Pharmacy:   [id number] [place], [address] [place] [address] Phone: [phone] Fax: [phone]     Social Determinants of Health (SDOH) Interventions    Readmission Risk Interventions Readmission Risk Prevention Plan 05/03/2021 12/11/2020 09/21/2020  Transportation Screening Complete Complete Complete   Medication Review Press photographer) Complete Complete Complete  PCP or Specialist appointment within 3-5 days of discharge - Complete Complete  HRI or Home Care Consult - Complete Complete  SW Recovery Care/Counseling Consult - Complete Complete  Palliative Care Screening Complete Not Applicable Complete  Skilled [place] - Not Complete Complete  SNF Comments - working on placement -  Some recent data might be hidden

## 2021-05-08 NOTE — Progress Notes (Signed)
Nutrition Follow Up Note   DOCUMENTATION CODES:   Obesity unspecified  INTERVENTION:   Boost Breeze po TID, each supplement provides 250 kcal and 9 grams of protein  Magic cup TID with meals, each supplement provides 290 kcal and 9 grams of protein  Rena-vit po daily   Vitamin C 548m po BID  NUTRITION DIAGNOSIS:   Inadequate oral intake related to acute illness as evidenced by NPO status. -resolving   GOAL:   Patient will meet greater than or equal to 90% of their needs -progressing   MONITOR:   PO intake, Supplement acceptance, Labs, Weight trends, Skin, I & O's  ASSESSMENT:   60y/o female with h/o CHF, depression, anxiety, DM, COPD, ESRD on HD, HOH, MI, hiatal hernia, gastroparesis and renal cancer who is admitted with septic shock and bacteremia secondary to central line.  Met with pt in room today. Pt is upset at time of RD arrival as she has just spoken with the diet office and is unable to order the meal items that she wanted for lunch. Pt is also upset that she was in dialysis this morning and had not gotten any breakfast. Pt eating BBQ chips during RD visit. Pt reports that her appetite is improving in hospital; pt is documented to be eating 100% of meals. Pt reports that she is unable to tolerate the Nepro supplements as they give her diarrhea; however, pt has drank Nepro during every prior admission with no issues so suspect diarrhea is not r/t to the supplement. Pt reports that she is lactose intolerant; RD explained to pt that the supplements are lactose free. RD discussed with pt the importance of adequate intake needed to preserve lean muscle and replace losses from HD. Pt would like to try peach Boost Breeze; RD will order. Pt reports that's she is eating the Magic Cups on her meal trays. Per chart, pt is down 9lbs since admission but appears to be back to her UBW.   Medications reviewed and include: vitamin C, D3, ciprofloxacin, heparin, creon, imodium, remeron,  rena-vit, B12  Labs reviewed: K 4.2 wnl, BUN 35(H), creat 3.93(H), P 4.9(H), Mg 2.1 wnl Hgb 8.1(L), Hct 25.4(L) Cbgs- 138, 118, 113 x 24 hrs  Diet Order:   Diet Order             Diet Carb Modified Fluid consistency: Thin; Room service appropriate? Yes; Fluid restriction: 1200 mL Fluid  Diet effective now                  EDUCATION NEEDS:   No education needs have been identified at this time  Skin:  Skin Assessment: Reviewed RN Assessment (Bilateral foot wounds, Stage II sacrum)  Last BM:  8/17- type 4  Height:   Ht Readings from Last 1 Encounters:  04/27/21 5' 5"  (1.651 m)    Weight:   Wt Readings from Last 1 Encounters:  05/08/21 78.2 kg    Ideal Body Weight:  56.8 kg  BMI:  Body mass index is 28.69 kg/m.  Estimated Nutritional Needs:   Kcal:  1800-2100kcal/day  Protein:  90-105g/day  Fluid:  UOP +1L  CKoleen DistanceMS, RD, LDN Please [facility] and/or RD on-call/weekend/after hours pager

## 2021-05-08 NOTE — Progress Notes (Signed)
Daily Progress Note   Patient Name: [name]       Date: 05/08/2021 DOB: [birth year]  Age: 60 y.o. MRN#: [id number] Attending Physician: Gwynne Edinger, MD Primary Care Physician: Denton Lank, MD Admit Date: 04/25/2021  Reason for Consultation/Follow-up: Establishing goals of care  Subjective: Patient is resting in bed in dialysis unit. She denies complaint presently. She states she has talked with her son and for now,  she just wants to "try to keep putting one foot in front of the other." She states God is in control of all of it. She would like to continue dialysis and would like to go to rehab to try to get stronger to go home.   Length of Stay: 13  Current Medications: Scheduled Meds:  . vitamin C  500 mg Oral BID  . atorvastatin  20 mg Per Tube QPM  . calcium carbonate  1 tablet Oral BID  . chlorhexidine  15 mL Mouth Rinse BID  . Chlorhexidine Gluconate Cloth  6 each Topical Q0600  . cholecalciferol  1,000 Units Per Tube Daily  . feeding supplement (NEPRO CARB STEADY)  237 mL Oral BID BM  . fentaNYL  1 patch Transdermal Q72H  . heparin  5,000 Units Subcutaneous Q8H  . lipase/protease/amylase  36,000 Units Oral TID AC  . loperamide  2 mg Oral BID  . mouth rinse  15 mL Mouth Rinse q12n4p  . midodrine  10 mg Per Tube TID WC  . mirtazapine  7.5 mg Per Tube QHS  . multivitamin  1 tablet Per Tube QHS  . nutrition supplement (JUVEN)  1 packet Per Tube BID BM  . QUEtiapine  25 mg Per Tube BID  . silver sulfADIAZINE   Topical BID  . cyanocobalamin  1,000 mcg Per Tube Daily    Continuous Infusions: . ampicillin-sulbactam (UNASYN) IV 3 g (05/07/21 2259)  .  ceFAZolin (ANCEF) IV      PRN Meds: acetaminophen **OR** acetaminophen, albuterol, dextrose, diphenhydrAMINE, docusate  sodium, fentaNYL (SUBLIMAZE) injection, heparin sodium (porcine), oxyCODONE-acetaminophen, polyvinyl alcohol  Physical Exam Pulmonary:     Effort: Pulmonary effort is normal.  Neurological:     Mental Status: She is alert.            Vital Signs: BP (!) 164/70   Pulse 81  Temp 98.7 F (37.1 C) (Oral)   Resp 16   Ht 5\' 5"  (1.651 m)   Wt 78.2 kg   SpO2 100%   BMI 28.69 kg/m  SpO2: SpO2: 100 % O2 Device: O2 Device: Nasal Cannula O2 Flow Rate: O2 Flow Rate (L/min): 3 L/min  Intake/output summary: No intake or output data in the 24 hours ending 05/08/21 1146 LBM: Last BM Date: 05/07/21 Baseline Weight: Weight: 52 kg Most recent weight: Weight: 78.2 kg         Patient Active Problem List   Diagnosis Date Noted  . Anemia of chronic kidney failure 05/03/2021  . Thrombocytopenia ([place]) 05/03/2021  . Severe sepsis with septic shock ([place]) 04/25/2021  . HCAP (healthcare-associated pneumonia) 04/25/2021  . Acute metabolic encephalopathy 78/67/6720  . UTI (urinary tract infection) 03/29/2021  . Chronic respiratory failure with hypoxia ([place]) 12/18/2020  . Superior vena caval syndrome 12/14/2020  . Chronic diastolic CHF (congestive heart failure) ([place]) 12/04/2020  . Bradycardia 09/17/2020  . Advanced care planning/counseling discussion   . Discitis of thoracic region   . Fluid overload 08/10/2020  . HLD (hyperlipidemia) 08/10/2020  . Chest pain 08/10/2020  . CAD (coronary artery disease) 08/10/2020  . GERD (gastroesophageal reflux disease) 08/10/2020  . Obesity (BMI 30-39.9) 06/06/2020  . Anemia in ESRD (end-stage renal disease) ([place]) 05/31/2020  . Type 1 diabetes mellitus ([place]) 05/31/2020  . End-stage renal disease ([place]) 05/31/2020  . Hypertensive disorder 05/31/2020  . Arm pain 05/21/2020  . Cellulitis of right leg 05/02/2020  . ESRD (end stage renal disease) ([place]) 04/13/2020  . SOBOE (shortness of breath on exertion) 02/08/2020  . Hematoma of arm, left, subsequent  encounter 11/24/2019  . Respiratory failure ([place])   . Shortness of breath   . Complication of arteriovenous dialysis fistula 11/13/2019  . PVD (peripheral vascular disease) ([place]) 11/13/2019  . Severe anemia 11/13/2019  . Pressure ulcer, stage 2 ([place]) 11/13/2019  . ESRD on hemodialysis ([place])   . Hemorrhagic shock ([place]) 11/11/2019  . Leg pain 10/02/2019  . CAP (community acquired pneumonia) 10/01/2019  . Atherosclerosis of native arteries of extremity with intermittent claudication ([place]) 05/01/2019  . Right-sided headache   . COPD (chronic obstructive pulmonary disease) ([place]) 10/06/2018  . Syncope 10/04/2018  . Symptomatic anemia 06/18/2018  . Gastroparesis due to DM ([place]) 01/18/2018  . Complication of vascular access for dialysis 12/04/2017  . Osteomyelitis ([place]) 09/30/2017  . Carotid stenosis 06/18/2017  . Bilateral carotid artery stenosis 06/16/2017  . Benign essential HTN 05/19/2017  . CKD (chronic kidney disease) stage 5, GFR less than 15 ml/min (HCC) 05/19/2017  . Hyperlipidemia, mixed 05/19/2017  . Shortness of breath 05/04/2017  . Rash, skin 12/16/2016  . Cellulitis of lower extremity 07/29/2016  . Chronic venous insufficiency 07/29/2016  . Lymphedema 07/29/2016  . TIA (transient ischemic attack) 04/21/2016  . Altered mental status 04/08/2016  . Hyperammonemia ([place]) 04/08/2016  . Elevated troponin 04/08/2016  . Depression 04/08/2016  . Depression, major, recurrent, severe with psychosis ([place]) 04/08/2016  . Blood in stool   . Intractable cyclical vomiting with nausea   . Reflux esophagitis   . Gastritis   . Generalized abdominal pain   . Uncontrollable vomiting   . Major depressive disorder, recurrent episode, moderate ([place]) 03/15/2016  . Adjustment disorder with mixed anxiety and depressed mood 03/15/2016  . Malnutrition of moderate degree 12/01/2015  . Renal mass   . Dyspnea   . Acute renal failure ([place])   . Respiratory failure ([place])   .  High temperature  11/14/2015  . Encounter for central line placement   . Encounter for orogastric (OG) tube placement   . Nausea 11/12/2015  . Hyperkalemia 10/03/2015  . Diarrhea, unspecified 07/22/2015  . Pneumonia 05/21/2015  . Hypoglycemia 04/24/2015  . Unresponsiveness 04/24/2015  . Sinus bradycardia 04/24/2015  . Hypothermia 04/24/2015  . Acute hypoxemic respiratory failure ([place]) 04/24/2015  . Acute on chronic diastolic CHF (congestive heart failure) ([place]) 04/05/2015  . Diabetic gastroparesis ([place]) 04/05/2015  . Hypokalemia 04/05/2015  . Generalized weakness 04/05/2015  . Acute pulmonary edema ([place]) 04/03/2015  . Nausea and vomiting 04/03/2015  . Hypoglycemia associated with diabetes ([place]) 04/03/2015  . Anemia of chronic disease 04/03/2015  . Secondary hyperparathyroidism ([place]) 04/03/2015  . Pressure ulcer 04/02/2015  . Acute respiratory failure with hypoxia ([place]) 04/01/2015  . Adjustment disorder with anxiety 03/14/2015  . Somatic symptom disorder, mild 03/08/2015  . Coronary artery disease involving native coronary artery of native heart without angina pectoris   . Nausea & vomiting 03/06/2015  . Abdominal pain 03/06/2015  . Diabetes mellitus, type II ([place]) 03/06/2015  . HTN (hypertension) 03/06/2015  . Gastroparesis 02/24/2015  . Pleural effusion 02/19/2015  . Bacteremia due to Enterococcus 02/19/2015  . End-stage renal disease on hemodialysis ([place]) 02/19/2015  . Diarrhea 08/01/2014    Palliative Care Assessment & Plan   Recommendations/Plan: Continue current care. Continue dialysis. Patient would like to go to rehab.  Recommend outpatient palliative.     Code Status:    Code Status Orders  (From admission, onward)           Start     Ordered   04/30/21 1445  Do not attempt resuscitation (DNR)  Continuous       Question Answer Comment  In the event of cardiac or respiratory ARREST Do not call a "code blue"   In the event of cardiac or respiratory ARREST Do not perform  Intubation, CPR, defibrillation or ACLS   In the event of cardiac or respiratory ARREST Use medication by any route, position, wound care, and other measures to relive pain and suffering. May use oxygen, suction and manual treatment of airway obstruction as needed for comfort.      04/30/21 1444           Code Status History     Date Active Date Inactive Code Status Order ID Comments User Context   04/26/2021 1432 04/30/2021 1444 Full Code [id number]  Ottie Glazier, MD ED   04/25/2021 2123 04/26/2021 1432 Full Code [id number]  HowerterEthelda Chick, DO ED   03/29/2021 0244 04/02/2021 2300 Full Code [id number]  Athena Masse, MD ED   12/04/2020 2042 12/17/2020 2147 DNR [id number]  Ileene Musa T, DO ED   12/04/2020 1939 12/04/2020 2042 Full Code [id number]  Orene Desanctis, DO ED   09/17/2020 1431 09/21/2020 1925 Full Code [id number]  Tyna Jaksch, MD ED   08/10/2020 1323 08/17/2020 2223 Full Code [id number]  Ivor Costa, MD ED   06/02/2020 2251 06/25/2020 2316 Full Code [id number]  Elwyn Reach, MD ED   05/22/2020 1201 05/23/2020 2050 Full Code [id number]  Vashti Hey, MD ED   05/02/2020 2057 05/06/2020 1929 Full Code [id number]  Mansy, Arvella Merles, MD ED   04/13/2020 1458 04/14/2020 2239 Full Code [id number]  Sela Hua, PA-C Inpatient   11/24/2019 2038 11/30/2019 1824 Full Code [id number]  Acheampong, Warnell Bureau, MD Inpatient   11/11/2019 0715 11/19/2019 0006 Full Code [id number]  Awilda Bill,  NP ED   09/30/2019 2126 10/01/2019 2257 Full Code [id number]  Nolberto Hanlon, MD ED   10/04/2018 1847 10/12/2018 0027 Full Code [id number]  Dustin Flock, MD Inpatient   06/18/2018 0449 06/20/2018 1642 Full Code [id number]  Arta Silence, MD ED   02/12/2018 1312 02/16/2018 0050 Full Code [id number]  Epifanio Lesches, MD ED   01/17/2018 1848 01/21/2018 1341 Full Code [id number]  Vaughan Basta, MD Inpatient   09/30/2017 1804 10/13/2017 2238 Full Code [id number]  Vaughan Basta, MD Inpatient   09/03/2017  2243 09/05/2017 1901 Full Code [id number]  Lance Coon, MD Inpatient   05/04/2017 1833 05/05/2017 2330 Full Code [id number]  Henreitta Leber, MD Inpatient   03/23/2017 1551 03/25/2017 1739 Full Code [id number]  Gladstone Lighter, MD Inpatient   04/21/2016 1559 04/21/2016 1717 Full Code [id number]  Nicholes Mango, MD Inpatient   04/08/2016 1415 04/11/2016 1947 Full Code [id number]  Theodoro Grist, MD Inpatient   03/15/2016 1322 03/20/2016 0019 Full Code [id number]  Bettey Costa, MD Inpatient   02/16/2016 1409 02/19/2016 2238 Full Code [id number]  Bettey Costa, MD ED   12/17/2015 1139 12/21/2015 2209 Full Code [id number]  Dustin Flock, MD ED   11/12/2015 1602 12/01/2015 1943 Full Code [id number]  Bettey Costa, MD Inpatient   10/07/2015 0937 10/12/2015 1959 Full Code [id number]  Fritzi Mandes, MD ED   10/03/2015 0355 10/05/2015 2230 Full Code [id number]  Lance Coon, MD Inpatient   08/31/2015 1600 09/03/2015 2209 Full Code [id number]  Demetrios Loll, MD ED   07/26/2015 1528 07/26/2015 2136 Full Code [id number]  Dionisio David, MD Inpatient   07/22/2015 1736 07/26/2015 1528 Full Code [id number]  Vaughan Basta, MD Inpatient   06/05/2015 2039 06/07/2015 2003 Full Code [id number]  Bettey Costa, MD Inpatient   05/21/2015 2142 05/24/2015 1626 Full Code [id number]  Henreitta Leber, MD Inpatient   04/24/2015 0855 04/26/2015 2211 Full Code [id number]  Demetrios Loll, MD Inpatient   04/01/2015 1558 04/05/2015 2007 Full Code [id number]  Epifanio Lesches, MD ED   03/06/2015 0805 03/09/2015 2111 Full Code [id number]  Juluis Mire, MD Inpatient   02/19/2015 1614 02/24/2015 1741 Full Code [id number]  Aldean Jewett, MD Inpatient        Thank you for allowing the Palliative Medicine Team to assist in the care of this patient.       Total Time 25 min Prolonged Time Billed  no       Greater than 50%  of this time was spent counseling and coordinating care related to the above assessment and plan.  Asencion Gowda, NP  Please contact Palliative  Medicine Team phone at [phone] for questions and concerns.

## 2021-05-08 NOTE — Progress Notes (Signed)
Patient refused peripheral IV placement this AM, states she does not need it. She is a hard stick and IV team came to insert the IV. Still got IV Ampicillin but has completed IV Unisyn treatment for ESBL. Currently in dialysis.

## 2021-05-08 NOTE — Progress Notes (Signed)
Pt had 3 hours of HD today with a net UF of 1.5L; tolerated the treatment with stable vital signs and denied complaints. CVC dressing was changed as per protocol. CVC limbs were locked with heparin post HD and capped.  Handoff report was given to care RN.

## 2021-05-08 NOTE — Progress Notes (Signed)
[name] MRN: [id number] DOB: 1961-04-10   Cancelled Treatment:    Reason Eval/Treat Not Completed: Other (comment). Pt currently out of room for HD at this time. Will re-attempt next date.    [name] 05/08/2021, 11:20 AM [name], PT, DPT [phone]

## 2021-05-09 DIAGNOSIS — R6521 Severe sepsis with septic shock: Secondary | ICD-10-CM | POA: Diagnosis not present

## 2021-05-09 DIAGNOSIS — A419 Sepsis, unspecified organism: Secondary | ICD-10-CM | POA: Diagnosis not present

## 2021-05-09 LAB — RENAL FUNCTION PANEL
Albumin: 2.3 g/dL — ABNORMAL LOW (ref 3.5–5.0)
Anion gap: 9 (ref 5–15)
BUN: 22 mg/dL (ref 8–23)
CO2: 28 mmol/L (ref 22–32)
Calcium: 8 mg/dL — ABNORMAL LOW (ref 8.9–10.3)
Chloride: 95 mmol/L — ABNORMAL LOW (ref 98–111)
Creatinine, Ser: 2.91 mg/dL — ABNORMAL HIGH (ref 0.44–1.00)
GFR, Estimated: 18 mL/min — ABNORMAL LOW (ref >60)
Glucose, Bld: 110 mg/dL — ABNORMAL HIGH (ref 70–99)
Phosphorus: 3.6 mg/dL (ref 2.5–4.6)
Potassium: 3.9 mmol/L (ref 3.5–5.1)
Sodium: 132 mmol/L — ABNORMAL LOW (ref 135–145)

## 2021-05-09 LAB — GLUCOSE, CAPILLARY
Glucose-Capillary: 104 mg/dL — ABNORMAL HIGH (ref 70–99)
Glucose-Capillary: 110 mg/dL — ABNORMAL HIGH (ref 70–99)
Glucose-Capillary: 113 mg/dL — ABNORMAL HIGH (ref 70–99)
Glucose-Capillary: 121 mg/dL — ABNORMAL HIGH (ref 70–99)
Glucose-Capillary: 131 mg/dL — ABNORMAL HIGH (ref 70–99)
Glucose-Capillary: 137 mg/dL — ABNORMAL HIGH (ref 70–99)
Glucose-Capillary: 145 mg/dL — ABNORMAL HIGH (ref 70–99)

## 2021-05-09 LAB — RESP PANEL BY RT-PCR (FLU A&B, COVID) ARPGX2
Influenza A by PCR: NEGATIVE
Influenza B by PCR: NEGATIVE
SARS Coronavirus 2 by RT PCR: NEGATIVE

## 2021-05-09 LAB — MAGNESIUM: Magnesium: 2 mg/dL (ref 1.7–2.4)

## 2021-05-09 MED ORDER — HYDRALAZINE HCL 20 MG/ML IJ SOLN
10.0000 mg | Freq: Four times a day (QID) | INTRAMUSCULAR | Status: DC | PRN
Start: 2021-05-09 — End: 2021-05-09

## 2021-05-09 MED ORDER — HYDRALAZINE HCL 25 MG PO TABS
25.0000 mg | ORAL_TABLET | Freq: Four times a day (QID) | ORAL | Status: AC | PRN
Start: 2021-05-09 — End: 2021-05-10
  Administered 2021-05-09 – 2021-05-10 (×2): 25 mg via ORAL
  Filled 2021-05-09 (×2): qty 1

## 2021-05-09 MED ORDER — SODIUM CHLORIDE 0.9 % IV SOLN
12.5000 mg | Freq: Once | INTRAVENOUS | Status: DC
  Filled 2021-05-09: qty 0.5

## 2021-05-09 MED ORDER — PROMETHAZINE HCL 25 MG PO TABS: 12.5000 mg | ORAL_TABLET | Freq: Four times a day (QID) | ORAL | Status: DC | PRN

## 2021-05-09 NOTE — Progress Notes (Signed)
[facility]  ROUNDING NOTE   Subjective:   [name] was admitted to [facility] on 04/25/2021 for Sepsis [facility]) [A41.9] Severe sepsis ([place]) [A41.9, R65.20] Fever in other diseases [R50.81]  Extubated 04/28/2021 Patient seen sitting up in bed Eating breakfast Voicing frustration with family not wanting to honor her health care wishes. States she is tired and been through a lot. States she would be great if she had a full healthcare team to take home with her. Says her son has told her in the past that he couldn't do any since he helps take care of her. Her grandchild is scared to come near her with her arm being swollen and scars from dialysis and procedures. She says she knows this will only get worse. She states she does not want any further procedures.   Patient seen laying in bed Son at bedside Frustrated with PT today Complains of soreness at HD access site   Objective:  Vital signs in last 24 hours:  Temp:  [98.4 F (36.9 C)-98.7 F (37.1 C)] 98.5 F (36.9 C) (08/18 0742) Pulse Rate:  [73-75] 74 (08/18 0742) Resp:  [16-20] 16 (08/18 0742) BP: (143-175)/(58-77) 169/75 (08/18 0742) SpO2:  [93 %-100 %] 100 % (08/18 0742) Weight:  [77.3 kg] 77.3 kg (08/18 0500)  Weight change: -0.9 kg Filed Weights   05/07/21 0524 05/08/21 0500 05/09/21 0500  Weight: 77.1 kg 78.2 kg 77.3 kg    Intake/Output: I/O last 3 completed shifts: In: 0  Out: 1500 [Other:1500]   Intake/Output this shift:  No intake/output data recorded.  Physical Exam: General: NAD  Head: Normocephalic, atraumatic. Moist oral mucosal membranes  Lungs:  Coarse breath sounds, normal breathing effort  Heart: Regular rate and rhythm  Abdomen:  Soft, nontender  Extremities: ++ peripheral edema.  Neurologic: Able to follow commands and answer simple questions  Skin: Bilateral foot wounds  Access:  Rt EJ Permcath placed on 05/07/21 by Schnier    Basic Metabolic Panel: Recent Labs  Lab  05/05/21 0607 05/06/21 0559 05/07/21 0718 05/08/21 0454 05/09/21 0716  NA 133* 134* 132* 135 132*  K 4.3 4.7 3.9 4.2 3.9  CL 96* 96* 95* 98 95*  CO2 29 28 26 28 28   GLUCOSE 112* 141* 78 128* 110*  BUN 36* 46* 28* 35* 22  CREATININE 3.22* 4.04* 3.14* 3.93* 2.91*  CALCIUM 8.4* 8.4* 7.8* 7.9* 8.0*  MG 2.2 2.2 2.1 2.1 2.0  PHOS 4.0 4.9* 3.7 4.9* 3.6     Liver Function Tests: Recent Labs  Lab 05/05/21 0607 05/06/21 0559 05/07/21 0718 05/08/21 0454 05/09/21 0716  ALBUMIN 2.3* 2.5* 2.1* 2.1* 2.3*    No results for input(s): LIPASE, AMYLASE in the last 168 hours.  No results for input(s): AMMONIA in the last 168 hours.   CBC: Recent Labs  Lab 05/08/21 1105  WBC 6.2  HGB 8.1*  HCT 25.4*  MCV 87.0  PLT 180     Cardiac Enzymes: No results for input(s): CKTOTAL, CKMB, CKMBINDEX, TROPONINI in the last 168 hours.  BNP: Invalid input(s): POCBNP  CBG: Recent Labs  Lab 05/08/21 2150 05/09/21 0037 05/09/21 0440 05/09/21 0740 05/09/21 1136  GLUCAP 143* 145* 104* 131* 113*     Microbiology: Results for orders placed or performed during the hospital encounter of 04/25/21  Culture, blood (Routine x 2)     Status: Abnormal   Collection Time: 04/25/21  4:51 PM   Specimen: BLOOD  Result Value Ref Range Status  Specimen Description   Final    BLOOD LW Performed at [facility], [address] [place], [place] [address]    Special Requests   Final    BOTTLES DRAWN AEROBIC AND ANAEROBIC Blood Culture adequate volume Performed at [facility], [address] [place], [place] [address]    Culture  Setup Time   Final    GRAM VARIABLE COCCOBACILLI AEROBIC BOTTLE ONLY Organism ID to follow CRITICAL RESULT CALLED TO, READ BACK BY AND VERIFIED WITH: [facility] AT 1014 04/26/21.PMF Performed at [facility], [address] [place], [place] [address]    Culture ACINETOBACTER CALCOACETICUS/BAUMANNII COMPLEX (A)  Final   Report  Status 04/28/2021 FINAL  Final   Organism ID, Bacteria ACINETOBACTER CALCOACETICUS/BAUMANNII COMPLEX  Final      Susceptibility   Acinetobacter calcoaceticus/baumannii complex - MIC*    CEFTAZIDIME 16 INTERMEDIATE Intermediate     CIPROFLOXACIN <=0.25 SENSITIVE Sensitive     GENTAMICIN <=1 SENSITIVE Sensitive     IMIPENEM <=0.25 SENSITIVE Sensitive     PIP/TAZO 32 INTERMEDIATE Intermediate     TRIMETH/SULFA <=20 SENSITIVE Sensitive     AMPICILLIN/SULBACTAM <=2 SENSITIVE Sensitive     * ACINETOBACTER CALCOACETICUS/BAUMANNII COMPLEX  Blood Culture ID Panel (Reflexed)     Status: Abnormal   Collection Time: 04/25/21  4:51 PM  Result Value Ref Range Status   Enterococcus faecalis NOT DETECTED NOT DETECTED Final   Enterococcus Faecium NOT DETECTED NOT DETECTED Final   Listeria monocytogenes NOT DETECTED NOT DETECTED Final   Staphylococcus species NOT DETECTED NOT DETECTED Final   Staphylococcus aureus (BCID) NOT DETECTED NOT DETECTED Final   Staphylococcus epidermidis NOT DETECTED NOT DETECTED Final   Staphylococcus lugdunensis NOT DETECTED NOT DETECTED Final   Streptococcus species NOT DETECTED NOT DETECTED Final   Streptococcus agalactiae NOT DETECTED NOT DETECTED Final   Streptococcus pneumoniae NOT DETECTED NOT DETECTED Final   Streptococcus pyogenes NOT DETECTED NOT DETECTED Final   A.calcoaceticus-baumannii DETECTED (A) NOT DETECTED Final    Comment: CRITICAL RESULT CALLED TO, READ BACK BY AND VERIFIED WITH: CHARLES SHANLEVER AT 1014 04/26/21.PMF    Bacteroides fragilis NOT DETECTED NOT DETECTED Final   Enterobacterales NOT DETECTED NOT DETECTED Final   Enterobacter cloacae complex NOT DETECTED NOT DETECTED Final   Escherichia coli NOT DETECTED NOT DETECTED Final   Klebsiella aerogenes NOT DETECTED NOT DETECTED Final   Klebsiella oxytoca NOT DETECTED NOT DETECTED Final   Klebsiella pneumoniae NOT DETECTED NOT DETECTED Final   Proteus species NOT DETECTED NOT DETECTED Final    Salmonella species NOT DETECTED NOT DETECTED Final   Serratia marcescens NOT DETECTED NOT DETECTED Final   Haemophilus influenzae NOT DETECTED NOT DETECTED Final   Neisseria meningitidis NOT DETECTED NOT DETECTED Final   Pseudomonas aeruginosa NOT DETECTED NOT DETECTED Final   Stenotrophomonas maltophilia NOT DETECTED NOT DETECTED Final   Candida albicans NOT DETECTED NOT DETECTED Final   Candida auris NOT DETECTED NOT DETECTED Final   Candida glabrata NOT DETECTED NOT DETECTED Final   Candida krusei NOT DETECTED NOT DETECTED Final   Candida parapsilosis NOT DETECTED NOT DETECTED Final   Candida tropicalis NOT DETECTED NOT DETECTED Final   Cryptococcus neoformans/gattii NOT DETECTED NOT DETECTED Final   CTX-M ESBL NOT DETECTED NOT DETECTED Final   Carbapenem resistance IMP NOT DETECTED NOT DETECTED Final   Carbapenem resistance KPC NOT DETECTED NOT DETECTED Final   Carbapenem resistance NDM NOT DETECTED NOT DETECTED Final   Carbapenem resistance VIM NOT DETECTED NOT  DETECTED Final    Comment: Performed at [facility], [address] [place], [place] [address]  Culture, blood (Routine x 2)     Status: Abnormal   Collection Time: 04/25/21  6:38 PM   Specimen: BLOOD  Result Value Ref Range Status   Specimen Description   Final    BLOOD BLOOD LEFT WRIST Performed at [facility], [address], [place], [place] [address]    Special Requests   Final    BOTTLES DRAWN AEROBIC AND ANAEROBIC Blood Culture adequate volume Performed at [facility], [address] [place], [place] [address]    Culture  Setup Time   Final    GRAM VARIABLE COCCOBACILLI AEROBIC BOTTLE ONLY CRITICAL VALUE NOTED.  VALUE IS CONSISTENT WITH PREVIOUSLY REPORTED AND CALLED VALUE. Performed at [facility], [address] [place], [place] [address]    Culture (A)  Final    ACINETOBACTER CALCOACETICUS/BAUMANNII COMPLEX SUSCEPTIBILITIES PERFORMED ON PREVIOUS CULTURE WITHIN  THE LAST 5 DAYS. Performed at [facility], [place] [address], [place], [place] [address]    Report Status 04/28/2021 FINAL  Final  Resp Panel by RT-PCR (Flu A&B, Covid) Nasopharyngeal Swab     Status: None   Collection Time: 04/25/21  6:54 PM   Specimen: Nasopharyngeal Swab; Nasopharyngeal(NP) swabs in vial transport medium  Result Value Ref Range Status   SARS Coronavirus 2 by RT PCR NEGATIVE NEGATIVE Final    Comment: (NOTE) SARS-CoV-2 target nucleic acids are NOT DETECTED.  The SARS-CoV-2 RNA is generally detectable in upper respiratory specimens during the acute phase of infection. The lowest concentration of SARS-CoV-2 viral copies this assay can detect is 138 copies/mL. A negative result does not preclude SARS-Cov-2 infection and should not be used as the sole basis for treatment or other patient management decisions. A negative result may occur with  improper specimen collection/handling, submission of specimen other than nasopharyngeal swab, presence of viral mutation(s) within the areas targeted by this assay, and inadequate number of viral copies(<138 copies/mL). A negative result must be combined with clinical observations, patient history, and epidemiological information. The expected result is Negative.  Fact Sheet for Patients:  EntrepreneurPulse.com.au  Fact Sheet for Healthcare Providers:  IncredibleEmployment.be  This test is no t yet approved or cleared by the [place] FDA and  has been authorized for detection and/or diagnosis of SARS-CoV-2 by FDA under an Emergency Use Authorization (EUA). This EUA will remain  in effect (meaning this test can be used) for the duration of the COVID-19 declaration under Section 564(b)(1) of the Act, 21 U.S.C.section 360bbb-3(b)(1), unless the authorization is terminated  or revoked sooner.       Influenza A by PCR NEGATIVE NEGATIVE Final   Influenza B by PCR NEGATIVE NEGATIVE  Final    Comment: (NOTE) The Xpert Xpress SARS-CoV-2/FLU/RSV plus assay is intended as an aid in the diagnosis of influenza from Nasopharyngeal swab specimens and should not be used as a sole basis for treatment. Nasal washings and aspirates are unacceptable for Xpert Xpress SARS-CoV-2/FLU/RSV testing.  Fact Sheet for Patients: EntrepreneurPulse.com.au  Fact Sheet for Healthcare Providers: IncredibleEmployment.be  This test is not yet approved or cleared by the [place] FDA and has been authorized for detection and/or diagnosis of SARS-CoV-2 by FDA under an Emergency Use Authorization (EUA). This EUA will remain in effect (meaning this test can be used) for the duration of the COVID-19 declaration under Section 564(b)(1) of the Act, 21 U.S.C. section 360bbb-3(b)(1),  unless the authorization is terminated or revoked.  Performed at [facility], [address] [place], [place] [address]   MRSA Next Gen by PCR, Nasal     Status: None   Collection Time: 04/26/21  6:35 PM   Specimen: Nasal Mucosa; Nasal Swab  Result Value Ref Range Status   MRSA by PCR Next Gen NOT DETECTED NOT DETECTED Final    Comment: (NOTE) The GeneXpert MRSA Assay (FDA approved for NASAL specimens only), is one component of a comprehensive MRSA colonization surveillance program. It is not intended to diagnose MRSA infection nor to guide or monitor treatment for MRSA infections. Test performance is not FDA approved in patients less than 43 years old. Performed at [facility], [address], [place], [place] [address]   Cath Tip Culture     Status: None   Collection Time: 04/27/21 11:59 AM   Specimen: Catheter Tip; Other  Result Value Ref Range Status   Specimen Description   Final    CATH TIP Performed at [facility], [address], [place], [place] [address]    Special Requests   Final    NONE Performed at [facility], [address], [place], [place] [address]    Culture   Final    NO GROWTH Performed at [facility], [place] [address] [place], [place] [address]    Report Status 05/01/2021 FINAL  Final  CULTURE, BLOOD (ROUTINE X 2) w Reflex to ID Panel     Status: None   Collection Time: 04/28/21 11:34 AM   Specimen: BLOOD  Result Value Ref Range Status   Specimen Description BLOOD RIGHT AC  Final   Special Requests   Final    BOTTLES DRAWN AEROBIC ONLY Blood Culture results may not be optimal due to an inadequate volume of blood received in culture bottles   Culture   Final    NO GROWTH 5 DAYS Performed at [facility], [address] [place], [place] [address]    Report Status 05/03/2021 FINAL  Final  CULTURE, BLOOD (ROUTINE X 2) w Reflex to ID Panel     Status: None   Collection Time: 04/28/21  5:27 PM   Specimen: BLOOD  Result Value Ref Range Status   Specimen Description BLOOD LT FINGER IN HAND  Final   Special Requests BOTTLES DRAWN AEROBIC ONLY  BCAV  Final   Culture   Final    NO GROWTH 5 DAYS Performed at [facility], [address] [place], [place] [address]    Report Status 05/03/2021 FINAL  Final    Coagulation Studies: No results for input(s): LABPROT, INR in the last 72 hours.   Urinalysis: No results for input(s): COLORURINE, LABSPEC, PHURINE, GLUCOSEU, HGBUR, BILIRUBINUR, KETONESUR, PROTEINUR, UROBILINOGEN, NITRITE, LEUKOCYTESUR in the last 72 hours.  Invalid input(s): APPERANCEUR    Imaging: No results found.   Medications:      vitamin C  500 mg Oral BID   atorvastatin  20 mg Per Tube QPM   calcium carbonate  1 tablet Oral BID   chlorhexidine  15 mL Mouth Rinse BID   Chlorhexidine Gluconate Cloth  6 each Topical Q0600   cholecalciferol  1,000 Units Per Tube Daily   ciprofloxacin  500 mg Oral Q24H   feeding supplement  1 Container Oral TID BM   fentaNYL  1 patch Transdermal Q72H   heparin  5,000 Units Subcutaneous Q8H    lipase/protease/amylase  36,000 Units  Oral TID AC   loperamide  2 mg Oral BID   mouth rinse  15 mL Mouth Rinse q12n4p   mirtazapine  7.5 mg Per Tube QHS   multivitamin  1 tablet Per Tube QHS   QUEtiapine  25 mg Per Tube BID   silver sulfADIAZINE   Topical BID   cyanocobalamin  1,000 mcg Per Tube Daily   acetaminophen **OR** acetaminophen, albuterol, dextrose, diphenhydrAMINE, docusate sodium, fentaNYL (SUBLIMAZE) injection, heparin sodium (porcine), oxyCODONE-acetaminophen, polyvinyl alcohol, promethazine  Assessment/ Plan:  [name] is a 60 y.o. black female with end stage renal disease on hemodialysis for greater than 14 years, hypertension, congestive heart failure, coronary artery disease, diabetes mellitus type II, who is admitted to [facility] on 04/25/2021 for Sepsis ([place]) [A41.9] Severe sepsis ([place]) [A41.9, R65.20] Fever in other diseases [R50.81]   [facility] MWF Lt Permcath 74kg   End Stage Renal Disease on dialysis: CRRT and pressors while in ICU;    Received dialysis yesterday, tolerated well. UF 1.5L removed Next dialysis scheduled for Friday  2. Sepsis: with hypotension and fever.  - permcath tip cultures are neg -  Acinetobacter bacteremia   - antibiotics   3. Anemia of chronic kidney disease  Lab Results  Component Value Date   HGB 8.1 (L) 05/08/2021   Hgb below target, will continue to monitor Epo with dialysis treatments   4. Secondary Hyperparathyroidism:    Lab Results  Component Value Date   PTH 357 (H) 10/05/2018   CALCIUM 8.0 (L) 05/09/2021   CAION 1.09 (L) 04/13/2020   PHOS 3.6 05/09/2021    Calcium remains below target, and phosphorus at goal Calcium carbonate BID  5.  Hypokalemia Stable  6. Volume overload - Will manage with dialysis    LOS: 14   8/18/20222:09 PM

## 2021-05-09 NOTE — Progress Notes (Signed)
Physical Therapy Treatment Patient Details Name: [name] MRN: [id number] DOB: 1960/10/01 Today's Date: 05/09/2021    History of Present Illness Pt is a 60 y/o F with PMH: ESRD on HD MWF, DM complicated by peripheral polyneuropathy and gastroparesis, chronic dHF, CAD, and PVD who presented to [facility] d/t fever. Pt adm for septic shock. This appears to be secondary to dialysis catheter infection.  Patient also developed acute hypoxemic respiratory failure required mechanical ventilation. Extub 8/7, out of ICU 8/9.    PT Comments    Pt was long sitting in bed, on 3 L o2 upon arriving. She agrees to PT session and was cooperative. Does require increased time to perform task and likes to dictate session progression but was willing/ motivated throughout. She was able to exit L side of bed with assistance, stand 3 x with +1 UE HHA prior to standing and taking ~ 3 steps from FOB to [employer] [facility] with mod assist. Author encouraged OOB in recliner for lunch however pt requested to return to bed. She was repositioned long sitting in bed with lunch tray in lap at conclusion of session. Son was present throughout most of session. Highly recommend DC to SNF to address deficits while improving independence with ADLs while decreasing caregiver burden.     Follow Up Recommendations  SNF     Equipment Recommendations  Other (comment) (defer to next level of care)       Precautions / Restrictions Precautions Precautions: Fall Restrictions Weight Bearing Restrictions: No    Mobility  Bed Mobility Overal bed mobility: Needs Assistance Bed Mobility: Supine to Sit;Sit to Supine     Supine to sit: Max assist;HOB elevated Sit to supine: Max assist   General bed mobility comments: Pt requires max assist to exit and enter bed. increased time due to pain/anxiety with movements. Was able to sit EOB x ~ 15 minutes thorughout session without LOB with BLE support only    Transfers Overall transfer level: Needs  assistance Equipment used: 1 person hand held assist Transfers: Sit to/from Stand Sit to Stand: From elevated surface;Mod assist         General transfer comment: MOd assist of one to stand 3 x from elevated bed height with LUE HHA only. tolerated standing x ~ 20-30 sec each trial of standing  Ambulation/Gait             General Gait Details: pt was able to take 3 steps from FOB to [facility]. increased time and mod assist to lateral wt shift to un-weight opposite LE for advancement      Balance Overall balance assessment: Needs assistance Sitting-balance support: Feet supported Sitting balance-Leahy Scale: Fair Sitting balance - Comments: sat EOB with supervision x > 10 minutes during session with feet support only   Standing balance support: Single extremity supported;During functional activity Standing balance-Leahy Scale: Poor Standing balance comment: pt's fear of falling and strength deficits make her high fall risk. was able to standing with LUE HHA but has posterior lean at times. needs constant assistance in standing to maintain balance.         Cognition Arousal/Alertness: Awake/alert Behavior During Therapy: WFL for tasks assessed/performed Overall Cognitive Status: Within Functional Limits for tasks assessed (per family, she is "tough" at times to deal with due to cognition)      General Comments: Pt is HOH but did follow simple one step commands consistently throughout without difficulty but with increased time      Exercises General Exercises -  Lower Extremity Ankle Circles/Pumps: AROM;10 reps Long Arc Quad: Seated;5 reps;Both        Pertinent Vitals/Pain Pain Assessment: 0-10 Pain Score: 4  Faces Pain Scale: Hurts little more Pain Location: Bilateral foot pain Pain Descriptors / Indicators: Discomfort Pain Intervention(s): Limited activity within patient's tolerance;Premedicated before session;Monitored during session;Repositioned     PT Goals  (current goals can now be found in the care plan section) Acute Rehab PT Goals Patient Stated Goal: I want to move better Progress towards PT goals: Progressing toward goals    Frequency    Min 2X/week      PT Plan Current plan remains appropriate       AM-PAC PT "6 Clicks" Mobility   Outcome Measure  Help needed turning from your back to your side while in a flat bed without using bedrails?: A Little Help needed moving from lying on your back to sitting on the side of a flat bed without using bedrails?: A Lot Help needed moving to and from a bed to a chair (including a wheelchair)?: A Lot Help needed standing up from a chair using your arms (e.g., wheelchair or bedside chair)?: A Lot Help needed to walk in hospital room?: A Lot Help needed climbing 3-5 steps with a railing? : Total 6 Click Score: 12    End of Session Equipment Utilized During Treatment: Gait belt;Oxygen Activity Tolerance: Patient tolerated treatment well Patient left: in bed;with call bell/phone within reach;with bed alarm set Nurse Communication: Mobility status PT Visit Diagnosis: Muscle weakness (generalized) (M62.81)     Time: 1610-9604 PT Time Calculation (min) (ACUTE ONLY): 20 min  Charges:  $Therapeutic Activity: 8-22 mins                     Julaine Fusi PTA 05/09/21, 12:35 PM

## 2021-05-09 NOTE — Progress Notes (Signed)
[name] MRN: [id number] DOB: [birth year]   Cancelled Treatment:    Reason Eval/Treat Not Completed: Other (comment). Treatment attempted. Pt easily agitated and frustrated that therapist entered room. Refused to work with therapy at this time. Discussed with care team. Will attempt to return per patient request, but pt has a history of refusing care. Will re-attempt, schedule permitting.   [name] 05/09/2021, 10:42 AM [name], PT, DPT [phone]

## 2021-05-09 NOTE — Progress Notes (Signed)
PROGRESS NOTE    [name]  XVQ:[id number] DOB: Feb 13, 1961 DOA: 04/25/2021 PCP: Denton Lank, MD    Brief Narrative:  [name] is a 60 y.o. female with medical history significant for end-stage renal disease on hemodialysis on Monday, Wednesday, Friday schedule, diabetes mellitus complicated by peripheral polyneuropathy and gastroparesis,, hyperlipidemia, anemia of chronic kidney disease with baseline hemoglobin 8-10, hypertension, who is admitted to [facility] on 04/25/2021 with severe sepsis after presenting from home to [facility] ED complaining of fever.  Patient was diagnosed with septic shock, blood culture positive for Acinetobacter calcoaceticus.  This appears to be secondary to dialysis catheter infection.  Patient also developed acute hypoxemic respiratory failure required mechanical ventilation. Permacath was removed on 8/06.  Patient treated with Unasyn, last dose 8/20.   Assessment & Plan:   Principal Problem:   Severe sepsis with septic shock (HCC) Active Problems:   Diabetes mellitus, type II ([place])   Hypoglycemia associated with diabetes ([place])   Generalized weakness   Acute hypoxemic respiratory failure (HCC)   Pressure ulcer, stage 2 (HCC)   ESRD (end stage renal disease) (HCC)   GERD (gastroesophageal reflux disease)   Acute metabolic encephalopathy   HCAP (healthcare-associated pneumonia)   Anemia of chronic kidney failure   Thrombocytopenia (HCC)    Septic shock with Acinetobacter septicemia secondary to dialysis catheter infection. Acute hypoxemic respite failure secondary to septic shock. Permacath infection Patient condition has improved, is on baseline prn o2 Last dose antibiotics is 8/20. Pt declines IV; pharm discussed with ID and ok to complete abx course w/ oral cipro. ID following peripherally at this point D/c plan is snf, that is pending  Upset son Son became angry when I spoke with him today, stated pt/ot not working with his mom,  I explained they have been visiting her every day but she typically turns them away. He asks why we are planning discharge when his mom is weak, I explained that's the reason for rehab, where she will receive more intense physical therapy.  End-stage renal disease. Hypophosphatemia. Hyponatremia. Permacath replaced. Next dialysis planned for 8/20  CAD Cont home atorvastatin  Chronic pain Cont home fentanyl patch   DVT prophylaxis: Heparin Code Status: DNR Family Communication: son [name] updated telephonically 8/18 Disposition Plan:   Status is: Inpatient  Remains inpatient appropriate because:Unsafe d/c plan and Inpatient level of care appropriate due to severity of illness  Dispo: The patient is from: Home              Anticipated d/c is to: snf, sw working on bed, pt prefers [facility]              Patient currently is not medically stable to d/c.   Difficult to place patient No    I/O last 3 completed shifts: In: 0  Out: 1500 [Other:1500] No intake/output data recorded.     Consultants:  Nephrology  Procedures: Permcath  Antimicrobials: ciprofloxacin. Subjective: No complaints. Finally agreed to work with PT today.  Objective: Vitals:   05/08/21 2100 05/09/21 0431 05/09/21 0500 05/09/21 0742  BP: (!) 175/77 (!) 143/58  (!) 169/75  Pulse: 75 73  74  Resp: 20 16  16   Temp: 98.4 F (36.9 C) 98.7 F (37.1 C)  98.5 F (36.9 C)  TempSrc: Oral Oral  Oral  SpO2: 93% 100%  100%  Weight:   77.3 kg   Height:        Intake/Output Summary (Last 24 hours) at  05/09/2021 1240 Last data filed at 05/09/2021 0034 Gross per 24 hour  Intake 0 ml  Output 0 ml  Net 0 ml   Filed Weights   05/07/21 0524 05/08/21 0500 05/09/21 0500  Weight: 77.1 kg 78.2 kg 77.3 kg    Examination:  General exam: Appears calm and comfortable. Chronically ill appearing Respiratory system: Clear to auscultation. Respiratory effort normal. Cardiovascular system: S1 & S2 heard, RRR.  No JVD, murmurs, rubs, gallops or clicks. Trace pedal edema Gastrointestinal system: Abdomen is nondistended, soft and nontender. No organomegaly or masses felt. Normal bowel sounds heard. Central nervous system: Alert and oriented x3. No focal neurological deficits. Extremities: Symmetric 5 x 5 power. Skin: No rashes, lesions or ulcers Psychiatry: Judgement and insight appear normal. Mood & affect appropriate.     Data Reviewed: I have personally reviewed following labs and imaging studies  CBC: Recent Labs  Lab 05/08/21 1105  WBC 6.2  HGB 8.1*  HCT 25.4*  MCV 87.0  PLT 109   Basic Metabolic Panel: Recent Labs  Lab 05/05/21 0607 05/06/21 0559 05/07/21 0718 05/08/21 0454 05/09/21 0716  NA 133* 134* 132* 135 132*  K 4.3 4.7 3.9 4.2 3.9  CL 96* 96* 95* 98 95*  CO2 29 28 26 28 28   GLUCOSE 112* 141* 78 128* 110*  BUN 36* 46* 28* 35* 22  CREATININE 3.22* 4.04* 3.14* 3.93* 2.91*  CALCIUM 8.4* 8.4* 7.8* 7.9* 8.0*  MG 2.2 2.2 2.1 2.1 2.0  PHOS 4.0 4.9* 3.7 4.9* 3.6   GFR: Estimated Creatinine Clearance: 20.9 mL/min (A) (by C-G formula based on SCr of 2.91 mg/dL (H)). Liver Function Tests: Recent Labs  Lab 05/05/21 0607 05/06/21 0559 05/07/21 0718 05/08/21 0454 05/09/21 0716  ALBUMIN 2.3* 2.5* 2.1* 2.1* 2.3*   No results for input(s): LIPASE, AMYLASE in the last 168 hours. No results for input(s): AMMONIA in the last 168 hours. Coagulation Profile: No results for input(s): INR, PROTIME in the last 168 hours. Cardiac Enzymes: No results for input(s): CKTOTAL, CKMB, CKMBINDEX, TROPONINI in the last 168 hours. BNP (last 3 results) No results for input(s): PROBNP in the last 8760 hours. HbA1C: No results for input(s): HGBA1C in the last 72 hours. CBG: Recent Labs  Lab 05/08/21 2150 05/09/21 0037 05/09/21 0440 05/09/21 0740 05/09/21 1136  GLUCAP 143* 145* 104* 131* 113*   Lipid Profile: No results for input(s): CHOL, HDL, LDLCALC, TRIG, CHOLHDL, LDLDIRECT in  the last 72 hours. Thyroid Function Tests: No results for input(s): TSH, T4TOTAL, FREET4, T3FREE, THYROIDAB in the last 72 hours. Anemia Panel: No results for input(s): VITAMINB12, FOLATE, FERRITIN, TIBC, IRON, RETICCTPCT in the last 72 hours. Sepsis Labs: No results for input(s): PROCALCITON, LATICACIDVEN in the last 168 hours.  No results found for this or any previous visit (from the past 240 hour(s)).        Radiology Studies: No results found.      Scheduled Meds:  vitamin C  500 mg Oral BID   atorvastatin  20 mg Per Tube QPM   calcium carbonate  1 tablet Oral BID   chlorhexidine  15 mL Mouth Rinse BID   Chlorhexidine Gluconate Cloth  6 each Topical Q0600   cholecalciferol  1,000 Units Per Tube Daily   ciprofloxacin  500 mg Oral Q24H   feeding supplement  1 Container Oral TID BM   fentaNYL  1 patch Transdermal Q72H   heparin  5,000 Units Subcutaneous Q8H   lipase/protease/amylase  36,000 Units Oral TID AC  loperamide  2 mg Oral BID   mouth rinse  15 mL Mouth Rinse q12n4p   midodrine  10 mg Per Tube TID WC   mirtazapine  7.5 mg Per Tube QHS   multivitamin  1 tablet Per Tube QHS   QUEtiapine  25 mg Per Tube BID   silver sulfADIAZINE   Topical BID   cyanocobalamin  1,000 mcg Per Tube Daily   Continuous Infusions:     LOS: 14 days    Time spent: 30 minutes    Desma Maxim, MD [facility]   To contact the attending provider between 7A-7P or the covering provider during after hours 7P-7A, please log into the web site [email or web address] and access using universal  password for that web site. If you do not have the password, please call the hospital operator.  05/09/2021, 12:40 PM

## 2021-05-09 NOTE — TOC Progression Note (Signed)
Transition of Care [facility]) - Progression Note    Patient Details  Name: [name] MRN: [id number] Date of Birth: 03-13-1961  Transition of Care [facility]) CM/SW [place], LCSW Phone Number: 05/09/2021, 4:01 PM  Clinical Narrative:   Notified patient that [facility] has offered a bed and can take her tomorrow. She is very happy about this and asked CSW to notify her son. Left him a voicemail. Asked nephrology to dialyze her early tomorrow in preparation for afternoon discharge. Will start insurance authorization.  Expected Discharge Plan: [place] Barriers to Discharge: Continued Medical Work up  Expected Discharge Plan and Services Expected Discharge Plan: [place] In-house Referral: NA   Post Acute Care Choice: [facility] Living arrangements for the past 2 months: Single Family Home                                       Social Determinants of Health (SDOH) Interventions    Readmission Risk Interventions Readmission Risk Prevention Plan 05/03/2021 12/11/2020 09/21/2020  Transportation Screening Complete Complete Complete  Medication Review Press photographer) Complete Complete Complete  PCP or Specialist appointment within 3-5 days of discharge - Complete Complete  HRI or Home Care Consult - Complete Complete  SW Recovery Care/Counseling Consult - Complete Complete  Palliative Care Screening Complete Not Applicable Complete  Skilled Nursing Facility - Not Complete Complete  SNF Comments - working on placement -  Some recent data might be hidden

## 2021-05-10 DIAGNOSIS — A419 Sepsis, unspecified organism: Secondary | ICD-10-CM | POA: Diagnosis not present

## 2021-05-10 DIAGNOSIS — R6521 Severe sepsis with septic shock: Secondary | ICD-10-CM | POA: Diagnosis not present

## 2021-05-10 LAB — RENAL FUNCTION PANEL
Albumin: 2.4 g/dL — ABNORMAL LOW (ref 3.5–5.0)
Anion gap: 7 (ref 5–15)
BUN: 26 mg/dL — ABNORMAL HIGH (ref 8–23)
CO2: 28 mmol/L (ref 22–32)
Calcium: 8.2 mg/dL — ABNORMAL LOW (ref 8.9–10.3)
Chloride: 95 mmol/L — ABNORMAL LOW (ref 98–111)
Creatinine, Ser: 3.73 mg/dL — ABNORMAL HIGH (ref 0.44–1.00)
GFR, Estimated: 13 mL/min — ABNORMAL LOW (ref >60)
Glucose, Bld: 92 mg/dL (ref 70–99)
Phosphorus: 3.8 mg/dL (ref 2.5–4.6)
Potassium: 4.4 mmol/L (ref 3.5–5.1)
Sodium: 130 mmol/L — ABNORMAL LOW (ref 135–145)

## 2021-05-10 LAB — GLUCOSE, CAPILLARY
Glucose-Capillary: 105 mg/dL — ABNORMAL HIGH (ref 70–99)
Glucose-Capillary: 119 mg/dL — ABNORMAL HIGH (ref 70–99)
Glucose-Capillary: 86 mg/dL (ref 70–99)
Glucose-Capillary: 87 mg/dL (ref 70–99)
Glucose-Capillary: 96 mg/dL (ref 70–99)

## 2021-05-10 LAB — MAGNESIUM: Magnesium: 2 mg/dL (ref 1.7–2.4)

## 2021-05-10 MED ORDER — HEPARIN SODIUM (PORCINE) 1000 UNIT/ML IJ SOLN
INTRAMUSCULAR | Status: AC
  Filled 2021-05-10: qty 1

## 2021-05-10 MED ORDER — CALCIUM CARBONATE ANTACID 500 MG PO CHEW: 1.0000 | CHEWABLE_TABLET | Freq: Three times a day (TID) | ORAL | Status: DC

## 2021-05-10 MED ORDER — HYDROCODONE-ACETAMINOPHEN 5-325 MG PO TABS: 1.0000 | ORAL_TABLET | Freq: Three times a day (TID) | ORAL | 0 refills | Status: DC | PRN

## 2021-05-10 MED ORDER — CIPROFLOXACIN HCL 500 MG PO TABS: 500.0000 mg | ORAL_TABLET | ORAL | 0 refills | Status: AC

## 2021-05-10 MED ORDER — COVID-19 MRNA VACC (MODERNA) 50 MCG/0.25ML IM SUSP
0.2500 mL | Freq: Once | INTRAMUSCULAR | Status: AC
  Administered 2021-05-10: 0.25 mL via INTRAMUSCULAR
  Filled 2021-05-10: qty 0.25

## 2021-05-10 MED ORDER — PROMETHAZINE HCL 12.5 MG PO TABS: 12.5000 mg | ORAL_TABLET | Freq: Four times a day (QID) | ORAL | 0 refills | Status: DC | PRN

## 2021-05-10 NOTE — Progress Notes (Signed)
[facility]  ROUNDING NOTE   Subjective:   [name] was admitted to [facility] on 04/25/2021 for Sepsis [facility]) [A41.9] Severe sepsis ([place]) [A41.9, R65.20] Fever in other diseases [R50.81]  Extubated 04/28/2021 Patient seen sitting up in bed Eating breakfast Voicing frustration with family not wanting to honor her health care wishes. States she is tired and been through a lot. States she would be great if she had a full healthcare team to take home with her. Says her son has told her in the past that he couldn't do any since he helps take care of her. Her grandchild is scared to come near her with her arm being swollen and scars from dialysis and procedures. She says she knows this will only get worse. She states she does not want any further procedures.   Patient seen during dialysis   HEMODIALYSIS FLOWSHEET:  Blood Flow Rate (mL/min): 400 mL/min Arterial Pressure (mmHg): -110 mmHg Venous Pressure (mmHg): 100 mmHg Transmembrane Pressure (mmHg): 70 mmHg Ultrafiltration Rate (mL/min): 670 mL/min Dialysate Flow Rate (mL/min): 500 ml/min Conductivity: Machine : 13.5 Conductivity: Machine : 13.5 Dialysis Fluid Bolus: Normal Saline Bolus Amount (mL): 300 mL  No complaints at this time    Objective:  Vital signs in last 24 hours:  Temp:  [98 F (36.7 C)-98.5 F (36.9 C)] 98 F (36.7 C) (08/19 0747) Pulse Rate:  [72-81] 73 (08/19 1145) Resp:  [10-21] 11 (08/19 1245) BP: (167-194)/(73-94) 167/73 (08/19 1245) SpO2:  [90 %-100 %] 100 % (08/19 0945) Weight:  [78 kg] 78 kg (08/19 0422)  Weight change: 0.7 kg Filed Weights   05/08/21 0500 05/09/21 0500 05/10/21 0422  Weight: 78.2 kg 77.3 kg 78 kg    Intake/Output: No intake/output data recorded.   Intake/Output this shift:  No intake/output data recorded.  Physical Exam: General: NAD, laying in bed  Head: Normocephalic, atraumatic. Moist oral mucosal membranes  Lungs:  Diminished breath sounds, normal breathing  effort  Heart: Regular rate and rhythm  Abdomen:  Soft, nontender  Extremities: ++ peripheral edema.  Neurologic: Able to follow commands and answer simple questions  Skin: Bilateral foot wounds  Access:  Rt EJ Permcath placed on 05/07/21 by Schnier    Basic Metabolic Panel: Recent Labs  Lab 05/06/21 0559 05/07/21 0718 05/08/21 0454 05/09/21 0716 05/10/21 0612  NA 134* 132* 135 132* 130*  K 4.7 3.9 4.2 3.9 4.4  CL 96* 95* 98 95* 95*  CO2 28 26 28 28 28   GLUCOSE 141* 78 128* 110* 92  BUN 46* 28* 35* 22 26*  CREATININE 4.04* 3.14* 3.93* 2.91* 3.73*  CALCIUM 8.4* 7.8* 7.9* 8.0* 8.2*  MG 2.2 2.1 2.1 2.0 2.0  PHOS 4.9* 3.7 4.9* 3.6 3.8     Liver Function Tests: Recent Labs  Lab 05/06/21 0559 05/07/21 0718 05/08/21 0454 05/09/21 0716 05/10/21 0612  ALBUMIN 2.5* 2.1* 2.1* 2.3* 2.4*    No results for input(s): LIPASE, AMYLASE in the last 168 hours.  No results for input(s): AMMONIA in the last 168 hours.   CBC: Recent Labs  Lab 05/08/21 1105  WBC 6.2  HGB 8.1*  HCT 25.4*  MCV 87.0  PLT 180     Cardiac Enzymes: No results for input(s): CKTOTAL, CKMB, CKMBINDEX, TROPONINI in the last 168 hours.  BNP: Invalid input(s): POCBNP  CBG: Recent Labs  Lab 05/09/21 2150 05/10/21 0052 05/10/21 0419 05/10/21 0746 05/10/21 1056  GLUCAP 110* 119* 105* 86 96     Microbiology: Results  for orders placed or performed during the hospital encounter of 04/25/21  Culture, blood (Routine x 2)     Status: Abnormal   Collection Time: 04/25/21  4:51 PM   Specimen: BLOOD  Result Value Ref Range Status   Specimen Description   Final    BLOOD LW Performed at [facility], [address], [place], [place] [address]    Special Requests   Final    BOTTLES DRAWN AEROBIC AND ANAEROBIC Blood Culture adequate volume Performed at [facility], [address] [place], [place] [address]    Culture  Setup Time   Final    GRAM VARIABLE  COCCOBACILLI AEROBIC BOTTLE ONLY Organism ID to follow CRITICAL RESULT CALLED TO, READ BACK BY AND VERIFIED WITH: [facility] AT 6073 04/26/21.PMF Performed at [facility], [address] [place], [place] [address]    Culture ACINETOBACTER CALCOACETICUS/BAUMANNII COMPLEX (A)  Final   Report Status 04/28/2021 FINAL  Final   Organism ID, Bacteria ACINETOBACTER CALCOACETICUS/BAUMANNII COMPLEX  Final      Susceptibility   Acinetobacter calcoaceticus/baumannii complex - MIC*    CEFTAZIDIME 16 INTERMEDIATE Intermediate     CIPROFLOXACIN <=0.25 SENSITIVE Sensitive     GENTAMICIN <=1 SENSITIVE Sensitive     IMIPENEM <=0.25 SENSITIVE Sensitive     PIP/TAZO 32 INTERMEDIATE Intermediate     TRIMETH/SULFA <=20 SENSITIVE Sensitive     AMPICILLIN/SULBACTAM <=2 SENSITIVE Sensitive     * ACINETOBACTER CALCOACETICUS/BAUMANNII COMPLEX  Blood Culture ID Panel (Reflexed)     Status: Abnormal   Collection Time: 04/25/21  4:51 PM  Result Value Ref Range Status   Enterococcus faecalis NOT DETECTED NOT DETECTED Final   Enterococcus Faecium NOT DETECTED NOT DETECTED Final   Listeria monocytogenes NOT DETECTED NOT DETECTED Final   Staphylococcus species NOT DETECTED NOT DETECTED Final   Staphylococcus aureus (BCID) NOT DETECTED NOT DETECTED Final   Staphylococcus epidermidis NOT DETECTED NOT DETECTED Final   Staphylococcus lugdunensis NOT DETECTED NOT DETECTED Final   Streptococcus species NOT DETECTED NOT DETECTED Final   Streptococcus agalactiae NOT DETECTED NOT DETECTED Final   Streptococcus pneumoniae NOT DETECTED NOT DETECTED Final   Streptococcus pyogenes NOT DETECTED NOT DETECTED Final   A.calcoaceticus-baumannii DETECTED (A) NOT DETECTED Final    Comment: CRITICAL RESULT CALLED TO, READ BACK BY AND VERIFIED WITH: CHARLES SHANLEVER AT 1014 04/26/21.PMF    Bacteroides fragilis NOT DETECTED NOT DETECTED Final   Enterobacterales NOT DETECTED NOT DETECTED Final   Enterobacter cloacae  complex NOT DETECTED NOT DETECTED Final   Escherichia coli NOT DETECTED NOT DETECTED Final   Klebsiella aerogenes NOT DETECTED NOT DETECTED Final   Klebsiella oxytoca NOT DETECTED NOT DETECTED Final   Klebsiella pneumoniae NOT DETECTED NOT DETECTED Final   Proteus species NOT DETECTED NOT DETECTED Final   Salmonella species NOT DETECTED NOT DETECTED Final   Serratia marcescens NOT DETECTED NOT DETECTED Final   Haemophilus influenzae NOT DETECTED NOT DETECTED Final   Neisseria meningitidis NOT DETECTED NOT DETECTED Final   Pseudomonas aeruginosa NOT DETECTED NOT DETECTED Final   Stenotrophomonas maltophilia NOT DETECTED NOT DETECTED Final   Candida albicans NOT DETECTED NOT DETECTED Final   Candida auris NOT DETECTED NOT DETECTED Final   Candida glabrata NOT DETECTED NOT DETECTED Final   Candida krusei NOT DETECTED NOT DETECTED Final   Candida parapsilosis NOT DETECTED NOT DETECTED Final   Candida tropicalis NOT DETECTED NOT DETECTED Final   Cryptococcus neoformans/gattii NOT DETECTED NOT DETECTED Final   CTX-M ESBL  NOT DETECTED NOT DETECTED Final   Carbapenem resistance IMP NOT DETECTED NOT DETECTED Final   Carbapenem resistance KPC NOT DETECTED NOT DETECTED Final   Carbapenem resistance NDM NOT DETECTED NOT DETECTED Final   Carbapenem resistance VIM NOT DETECTED NOT DETECTED Final    Comment: Performed at [facility], [address] [place], [place] [address]  Culture, blood (Routine x 2)     Status: Abnormal   Collection Time: 04/25/21  6:38 PM   Specimen: BLOOD  Result Value Ref Range Status   Specimen Description   Final    BLOOD BLOOD LEFT WRIST Performed at [facility], [address] [place], [place] [address]    Special Requests   Final    BOTTLES DRAWN AEROBIC AND ANAEROBIC Blood Culture adequate volume Performed at [facility], [address] [place], [place] [address]    Culture  Setup Time   Final    GRAM VARIABLE  COCCOBACILLI AEROBIC BOTTLE ONLY CRITICAL VALUE NOTED.  VALUE IS CONSISTENT WITH PREVIOUSLY REPORTED AND CALLED VALUE. Performed at [facility], [address] [place], [place] [address]    Culture (A)  Final    ACINETOBACTER CALCOACETICUS/BAUMANNII COMPLEX SUSCEPTIBILITIES PERFORMED ON PREVIOUS CULTURE WITHIN THE LAST 5 DAYS. Performed at [facility], [place] [address] [place], [place] [address]    Report Status 04/28/2021 FINAL  Final  Resp Panel by RT-PCR (Flu A&B, Covid) Nasopharyngeal Swab     Status: None   Collection Time: 04/25/21  6:54 PM   Specimen: Nasopharyngeal Swab; Nasopharyngeal(NP) swabs in vial transport medium  Result Value Ref Range Status   SARS Coronavirus 2 by RT PCR NEGATIVE NEGATIVE Final    Comment: (NOTE) SARS-CoV-2 target nucleic acids are NOT DETECTED.  The SARS-CoV-2 RNA is generally detectable in upper respiratory specimens during the acute phase of infection. The lowest concentration of SARS-CoV-2 viral copies this assay can detect is 138 copies/mL. A negative result does not preclude SARS-Cov-2 infection and should not be used as the sole basis for treatment or other patient management decisions. A negative result may occur with  improper specimen collection/handling, submission of specimen other than nasopharyngeal swab, presence of viral mutation(s) within the areas targeted by this assay, and inadequate number of viral copies(<138 copies/mL). A negative result must be combined with clinical observations, patient history, and epidemiological information. The expected result is Negative.  Fact Sheet for Patients:  EntrepreneurPulse.com.au  Fact Sheet for Healthcare Providers:  IncredibleEmployment.be  This test is no t yet approved or cleared by the [place] FDA and  has been authorized for detection and/or diagnosis of SARS-CoV-2 by FDA under an Emergency Use Authorization (EUA). This  EUA will remain  in effect (meaning this test can be used) for the duration of the COVID-19 declaration under Section 564(b)(1) of the Act, 21 U.S.C.section 360bbb-3(b)(1), unless the authorization is terminated  or revoked sooner.       Influenza A by PCR NEGATIVE NEGATIVE Final   Influenza B by PCR NEGATIVE NEGATIVE Final    Comment: (NOTE) The Xpert Xpress SARS-CoV-2/FLU/RSV plus assay is intended as an aid in the diagnosis of influenza from Nasopharyngeal swab specimens and should not be used as a sole basis for treatment. Nasal washings and aspirates are unacceptable for Xpert Xpress SARS-CoV-2/FLU/RSV testing.  Fact Sheet for Patients: EntrepreneurPulse.com.au  Fact Sheet for Healthcare Providers: IncredibleEmployment.be  This test is not yet approved or cleared by the [place] and has been authorized  for detection and/or diagnosis of SARS-CoV-2 by FDA under an Emergency Use Authorization (EUA). This EUA will remain in effect (meaning this test can be used) for the duration of the COVID-19 declaration under Section 564(b)(1) of the Act, 21 U.S.C. section 360bbb-3(b)(1), unless the authorization is terminated or revoked.  Performed at [facility], [address] [place], [place] [address]   MRSA Next Gen by PCR, Nasal     Status: None   Collection Time: 04/26/21  6:35 PM   Specimen: Nasal Mucosa; Nasal Swab  Result Value Ref Range Status   MRSA by PCR Next Gen NOT DETECTED NOT DETECTED Final    Comment: (NOTE) The GeneXpert MRSA Assay (FDA approved for NASAL specimens only), is one component of a comprehensive MRSA colonization surveillance program. It is not intended to diagnose MRSA infection nor to guide or monitor treatment for MRSA infections. Test performance is not FDA approved in patients less than 27 years old. Performed at [facility], [address], [place], [place] [address]   Cath Tip  Culture     Status: None   Collection Time: 04/27/21 11:59 AM   Specimen: Catheter Tip; Other  Result Value Ref Range Status   Specimen Description   Final    CATH TIP Performed at [facility], [address], [place], [place] [address]    Special Requests   Final    NONE Performed at [facility], [address] [place], [place] [address]    Culture   Final    NO GROWTH Performed at [facility], [place] [address] [place], [place] [address]    Report Status 05/01/2021 FINAL  Final  CULTURE, BLOOD (ROUTINE X 2) w Reflex to ID Panel     Status: None   Collection Time: 04/28/21 11:34 AM   Specimen: BLOOD  Result Value Ref Range Status   Specimen Description BLOOD RIGHT AC  Final   Special Requests   Final    BOTTLES DRAWN AEROBIC ONLY Blood Culture results may not be optimal due to an inadequate volume of blood received in culture bottles   Culture   Final    NO GROWTH 5 DAYS Performed at [facility], [address] [place], [place] [address]    Report Status 05/03/2021 FINAL  Final  CULTURE, BLOOD (ROUTINE X 2) w Reflex to ID Panel     Status: None   Collection Time: 04/28/21  5:27 PM   Specimen: BLOOD  Result Value Ref Range Status   Specimen Description BLOOD LT FINGER IN HAND  Final   Special Requests BOTTLES DRAWN AEROBIC ONLY  BCAV  Final   Culture   Final    NO GROWTH 5 DAYS Performed at [facility], [address] [place], [place] [address]    Report Status 05/03/2021 FINAL  Final  Resp Panel by RT-PCR (Flu A&B, Covid) Nasopharyngeal Swab     Status: None   Collection Time: 05/09/21  4:08 PM   Specimen: Nasopharyngeal Swab; Nasopharyngeal(NP) swabs in vial transport medium  Result Value Ref Range Status   SARS Coronavirus 2 by RT PCR NEGATIVE NEGATIVE Final    Comment: (NOTE) SARS-CoV-2 target nucleic acids are NOT DETECTED.  The SARS-CoV-2 RNA is generally detectable in upper respiratory specimens during the  acute phase of infection. The lowest concentration of SARS-CoV-2 viral copies this assay can detect is 138 copies/mL. A negative result does not preclude SARS-Cov-2 infection and should not be used  as the sole basis for treatment or other patient management decisions. A negative result may occur with  improper specimen collection/handling, submission of specimen other than nasopharyngeal swab, presence of viral mutation(s) within the areas targeted by this assay, and inadequate number of viral copies(<138 copies/mL). A negative result must be combined with clinical observations, patient history, and epidemiological information. The expected result is Negative.  Fact Sheet for Patients:  EntrepreneurPulse.com.au  Fact Sheet for Healthcare Providers:  IncredibleEmployment.be  This test is no t yet approved or cleared by the [place] FDA and  has been authorized for detection and/or diagnosis of SARS-CoV-2 by FDA under an Emergency Use Authorization (EUA). This EUA will remain  in effect (meaning this test can be used) for the duration of the COVID-19 declaration under Section 564(b)(1) of the Act, 21 U.S.C.section 360bbb-3(b)(1), unless the authorization is terminated  or revoked sooner.       Influenza A by PCR NEGATIVE NEGATIVE Final   Influenza B by PCR NEGATIVE NEGATIVE Final    Comment: (NOTE) The Xpert Xpress SARS-CoV-2/FLU/RSV plus assay is intended as an aid in the diagnosis of influenza from Nasopharyngeal swab specimens and should not be used as a sole basis for treatment. Nasal washings and aspirates are unacceptable for Xpert Xpress SARS-CoV-2/FLU/RSV testing.  Fact Sheet for Patients: EntrepreneurPulse.com.au  Fact Sheet for Healthcare Providers: IncredibleEmployment.be  This test is not yet approved or cleared by the [place] FDA and has been authorized for detection and/or  diagnosis of SARS-CoV-2 by FDA under an Emergency Use Authorization (EUA). This EUA will remain in effect (meaning this test can be used) for the duration of the COVID-19 declaration under Section 564(b)(1) of the Act, 21 U.S.C. section 360bbb-3(b)(1), unless the authorization is terminated or revoked.  Performed at [facility], [address] [place], [place] [address]     Coagulation Studies: No results for input(s): LABPROT, INR in the last 72 hours.   Urinalysis: No results for input(s): COLORURINE, LABSPEC, PHURINE, GLUCOSEU, HGBUR, BILIRUBINUR, KETONESUR, PROTEINUR, UROBILINOGEN, NITRITE, LEUKOCYTESUR in the last 72 hours.  Invalid input(s): APPERANCEUR    Imaging: No results found.   Medications:      vitamin C  500 mg Oral BID   atorvastatin  20 mg Per Tube QPM   calcium carbonate  1 tablet Oral BID   chlorhexidine  15 mL Mouth Rinse BID   Chlorhexidine Gluconate Cloth  6 each Topical Q0600   cholecalciferol  1,000 Units Per Tube Daily   ciprofloxacin  500 mg Oral Q24H   feeding supplement  1 Container Oral TID BM   fentaNYL  1 patch Transdermal Q72H   heparin  5,000 Units Subcutaneous Q8H   heparin sodium (porcine)       lipase/protease/amylase  36,000 Units Oral TID AC   loperamide  2 mg Oral BID   mouth rinse  15 mL Mouth Rinse q12n4p   mirtazapine  7.5 mg Per Tube QHS   multivitamin  1 tablet Per Tube QHS   QUEtiapine  25 mg Per Tube BID   silver sulfADIAZINE   Topical BID   cyanocobalamin  1,000 mcg Per Tube Daily   acetaminophen **OR** acetaminophen, albuterol, dextrose, diphenhydrAMINE, docusate sodium, fentaNYL (SUBLIMAZE) injection, heparin sodium (porcine), hydrALAZINE, oxyCODONE-acetaminophen, polyvinyl alcohol, promethazine  Assessment/ Plan:  [name] is a 60 y.o. black female with end stage renal disease on hemodialysis for greater than 14 years, hypertension, congestive heart failure, coronary artery disease, diabetes  mellitus type  II, who is admitted to [facility] on 04/25/2021 for Sepsis ([place]) [A41.9] Severe sepsis ([place]) [A41.9, R65.20] Fever in other diseases [R50.81]   [facility] MWF Lt Permcath 74kg   End Stage Renal Disease on dialysis: CRRT and pressors while in ICU;    Received dialysis today, tolerated well. UF 1.5L removed Next dialysis scheduled for Monday  2. Sepsis: with hypotension and fever.  - permcath tip cultures are neg -  Acinetobacter bacteremia   - antibiotics completed on 05/11/21   3. Anemia of chronic kidney disease  Lab Results  Component Value Date   HGB 8.1 (L) 05/08/2021   Hgb not at goal, will continue to monitor Epo with dialysis treatments   4. Secondary Hyperparathyroidism:    Lab Results  Component Value Date   PTH 357 (H) 10/05/2018   CALCIUM 8.2 (L) 05/10/2021   CAION 1.09 (L) 04/13/2020   PHOS 3.8 05/10/2021    Calcium remains below target, and phosphorus at goal Increase to Calcium carbonate TID  5.  Hypokalemia Stable with dialysis  6. Volume overload - Will manage with dialysis    LOS: 15   8/19/20221:24 PM

## 2021-05-10 NOTE — Progress Notes (Signed)
Pt. tolerated tx without incident, concerned about BG level, checked 96. Maintained BFR per prescription, via CVC. Fld removal 1.5 liters. No s/sx of infection, afebrile. Pt returned to room, scheduled for discharge  no concerns.

## 2021-05-10 NOTE — Progress Notes (Signed)
ID Pt with acinetobacter bacteremia due to HD cath which has been removed- pt is on day 13 of appropriate antibiotic- 05/11/21 end of treatment- Id will sign off

## 2021-05-10 NOTE — Progress Notes (Signed)
Pt discharged to SNF. Left unit on stretcher pushed by ambulance staff. Left in stable condition. Copy of AVS placed in discharge folder and handed to female ambulance staff.Vitals wnl. Report called and given to Colletta Maryland, nurse at [facility]. All nurse's questions answered to her satisfaction.

## 2021-05-10 NOTE — Discharge Summary (Signed)
[name] QMG:[id number] DOB: 1961-01-31 DOA: 04/25/2021  PCP: Denton Lank, MD  Admit date: 04/25/2021 Discharge date: 05/10/2021  Time spent: 35 minutes     Discharge Diagnoses:  Principal Problem:   Severe sepsis with septic shock [facility]) Active Problems:   Diabetes mellitus, type II ([place])   Hypoglycemia associated with diabetes ([place])   Generalized weakness   Acute hypoxemic respiratory failure (HCC)   Pressure ulcer, stage 2 (HCC)   ESRD (end stage renal disease) ([place])   GERD (gastroesophageal reflux disease)   Acute metabolic encephalopathy   HCAP (healthcare-associated pneumonia)   Anemia of chronic kidney failure   Thrombocytopenia ([place])   Discharge Condition: stable  Diet recommendation: renal  Filed Weights   05/08/21 0500 05/09/21 0500 05/10/21 0422  Weight: 78.2 kg 77.3 kg 78 kg    History of present illness:  [name] is a 60 y.o. female with medical history significant for end-stage renal disease on hemodialysis on Monday, Wednesday, Friday schedule, diabetes mellitus complicated by peripheral polyneuropathy and gastroparesis,, hyperlipidemia, anemia of chronic kidney disease with baseline hemoglobin 8-10, hypertension, who is admitted to [facility] on 04/25/2021 with severe sepsis after presenting from home to [facility] ED complaining of fever.  Patient was diagnosed with septic shock, blood culture positive for Acinetobacter calcoaceticus.  This appears to be secondary to dialysis catheter infection.  Patient also developed acute hypoxemic respiratory failure required mechanical ventilation. Permacath was removed on 8/06.  Patient treated with Unasyn, last dose 8/20.  Hospital Course:  Septic shock with Acinetobacter septicemia secondary to dialysis catheter infection. Acute hypoxemic respite failure secondary to septic shock. Permacath infection Patient condition has improved, is on baseline prn o2 Last dose antibiotics is 8/20. Pt declines  IV; pharm discussed with ID and ok to complete abx course w/ oral cipro.   End-stage renal disease. Hypophosphatemia. Hyponatremia. Permacath replaced. received dialysis 8/19, will continue mwf   Procedures: dialysis   Consultations: nephrology  Discharge Exam: Vitals:   05/10/21 1245 05/10/21 1300  BP: (!) 167/73 (!) 170/75  Pulse:    Resp: 11 14  Temp:    SpO2:      General exam: Appears calm and comfortable. Chronically ill appearing Respiratory system: Clear to auscultation. Respiratory effort normal. Cardiovascular system: S1 & S2 heard, RRR. No JVD, murmurs, rubs, gallops or clicks. Trace pedal edema Gastrointestinal system: Abdomen is nondistended, soft and nontender. No organomegaly or masses felt. Normal bowel sounds heard. Central nervous system: Alert and oriented x3. No focal neurological deficits. Extremities: Symmetric 5 x 5 power. Skin: No rashes, lesions or ulcers Psychiatry: Judgement and insight appear normal. Mood & affect appropriate.   Discharge Instructions   Discharge Instructions     Diet - low sodium heart healthy   Complete by: As directed    Discharge wound care:   Complete by: As directed    Change dressing daily   Increase activity slowly   Complete by: As directed       Allergies as of 05/10/2021       Reactions   Ace Inhibitors Swelling, Anaphylaxis   Ativan [lorazepam] Other (See Comments)   Reaction:  Hallucinations and headaches   Compazine [prochlorperazine Edisylate] Anaphylaxis, Nausea And Vomiting, Other (See Comments)   Other reaction(s): dystonia from this vs. Reglan, 23 Jul - patient relates that she takes promethazine frequently with no problems   Sumatriptan Succinate Other (See Comments)   Other reaction(s): delirium and hallucinations per [facility] records   Zofran [  ondansetron] Nausea And Vomiting   Per pt. she is allergic to zofran or will experience adverse reaction like hallucination    Losartan Nausea Only    Prochlorperazine Other (See Comments)   Reaction:  Unknown . Patient does not remember reaction but she does have vertigo and anxiety along with n and v at times. Could be used to treat any of these   Reglan [metoclopramide] Other (See Comments)   Per patient her Dr. Evelina Bucy her off it    Scopolamine Other (See Comments)   Dizziness, also has vertigo already   Tape Rash   Plastic tape causes rash   Tapentadol Rash   Ultrasound Gel Itching   Patient states the Ultrasound gel caused itching while in skin contact and resolved when wiped away.        Medication List     STOP taking these medications    furosemide 80 MG tablet Commonly known as: LASIX   hydrALAZINE 50 MG tablet Commonly known as: APRESOLINE   megestrol 400 MG/10ML suspension Commonly known as: MEGACE       TAKE these medications    albuterol (2.5 MG/3ML) 0.083% nebulizer solution Commonly known as: PROVENTIL Take 2.5 mg by nebulization every 4 (four) hours as needed for wheezing or shortness of breath.   albuterol 108 (90 Base) MCG/ACT inhaler Commonly known as: VENTOLIN HFA Inhale 2 puffs into the lungs every 4 (four) hours as needed for wheezing or shortness of breath.   alum & mag hydroxide-simeth 200-200-20 MG/5ML suspension Commonly known as: MAALOX/MYLANTA Take 15 mLs by mouth every 6 (six) hours as needed for indigestion or heartburn.   amLODipine 10 MG tablet Commonly known as: NORVASC Take 1 tablet (10 mg total) by mouth daily.   ammonium lactate 12 % lotion Commonly known as: LAC-HYDRIN Apply 1 application topically 2 (two) times daily as needed for dry skin.   aspirin EC 81 MG tablet Take 81 mg by mouth daily.   atorvastatin 20 MG tablet Commonly known as: LIPITOR Take 20 mg by mouth every evening.   Biotin 1 MG Caps Take 1 mg by mouth daily.   calcium carbonate 500 MG chewable tablet Commonly known as: TUMS - dosed in mg elemental calcium Chew 2.5 tablets (500 mg of elemental  calcium total) by mouth every 6 (six) hours as needed for indigestion.   ciprofloxacin 500 MG tablet Commonly known as: CIPRO Take 1 tablet (500 mg total) by mouth daily for 1 day.   cyanocobalamin 1000 MCG tablet Take 1,000 mcg by mouth daily.   diphenhydrAMINE 25 MG tablet Commonly known as: BENADRYL Take 25 mg by mouth every 6 (six) hours as needed for itching.   epoetin alfa 10000 UNIT/ML injection Commonly known as: EPOGEN Inject 1 mL (10,000 Units total) into the vein every Monday, Wednesday, and Friday with hemodialysis.   feeding supplement (NEPRO CARB STEADY) Liqd Take 237 mLs by mouth 2 (two) times daily between meals.   gabapentin 100 MG capsule Commonly known as: NEURONTIN Take 100 mg by mouth at bedtime.   HYDROcodone-acetaminophen 5-325 MG tablet Commonly known as: NORCO/VICODIN Take 1 tablet by mouth 3 (three) times daily as needed for moderate pain or severe pain. What changed: when to take this   hydrOXYzine 25 MG tablet Commonly known as: ATARAX/VISTARIL Take 25 mg by mouth daily as needed for itching.   Iron 325 (65 Fe) MG Tabs Take 325 mg by mouth daily.   lidocaine 5 % Commonly known as: [place]  1 patch onto the skin daily. (Remove after 12 hours -- leave off for 12 hours before applying new patch)   meclizine 25 MG tablet Commonly known as: ANTIVERT Take 25 mg by mouth every 6 (six) hours as needed for dizziness.   mirtazapine 7.5 MG tablet Commonly known as: REMERON Take 7.5 mg by mouth daily.   multivitamin Tabs tablet Take 1 tablet by mouth daily.   mupirocin ointment 2 % Commonly known as: BACTROBAN Apply 1 application topically daily as needed (leg rash).   nitroGLYCERIN 0.4 MG SL tablet Commonly known as: NITROSTAT Place 0.4 mg under the tongue every 5 (five) minutes as needed for chest pain.   pantoprazole 40 MG tablet Commonly known as: PROTONIX Take 40 mg by mouth daily.   polyethylene glycol 17 g packet Commonly  known as: MIRALAX / GLYCOLAX Take 17 g by mouth daily as needed. What changed: reasons to take this   PROBIOTIC PO Take 1 capsule by mouth daily.   promethazine 12.5 MG tablet Commonly known as: PHENERGAN Take 1 tablet (12.5 mg total) by mouth every 6 (six) hours as needed for nausea or vomiting.   sevelamer carbonate 800 MG tablet Commonly known as: RENVELA Take 800 mg by mouth 4 (four) times daily. (Take with meals and snack)   Vitamin D3 25 MCG (1000 UT) Caps Take 1,000 Units by mouth daily.   vitamin E 180 MG (400 UNITS) capsule Take 400 Units by mouth daily.               Discharge Care Instructions  (From admission, onward)           Start     Ordered   05/10/21 0000  Discharge wound care:       Comments: Change dressing daily   05/10/21 1416           Allergies  Allergen Reactions   Ace Inhibitors Swelling and Anaphylaxis   Ativan [Lorazepam] Other (See Comments)    Reaction:  Hallucinations and headaches   Compazine [Prochlorperazine Edisylate] Anaphylaxis, Nausea And Vomiting and Other (See Comments)    Other reaction(s): dystonia from this vs. Reglan, 23 Jul - patient relates that she takes promethazine frequently with no problems   Sumatriptan Succinate Other (See Comments)    Other reaction(s): delirium and hallucinations per [facility] records   Zofran [Ondansetron] Nausea And Vomiting    Per pt. she is allergic to zofran or will experience adverse reaction like hallucination    Losartan Nausea Only   Prochlorperazine Other (See Comments)    Reaction:  Unknown . Patient does not remember reaction but she does have vertigo and anxiety along with n and v at times. Could be used to treat any of these    Reglan [Metoclopramide] Other (See Comments)    Per patient her Dr. Evelina Bucy her off it    Scopolamine Other (See Comments)    Dizziness, also has vertigo already   Tape Rash    Plastic tape causes rash   Tapentadol Rash   Ultrasound Gel Itching     Patient states the Ultrasound gel caused itching while in skin contact and resolved when wiped away.    Contact information for after-discharge care     [facility] .   Service: Skilled Chiropodist information: [place] [phone]  The results of significant diagnostics from this hospitalization (including imaging, microbiology, ancillary and laboratory) are listed below for reference.    Significant Diagnostic Studies: CT ABDOMEN PELVIS WO CONTRAST  Result Date: 04/25/2021 CLINICAL DATA:  Diffuse abdominal pain EXAM: CT ABDOMEN AND PELVIS WITHOUT CONTRAST TECHNIQUE: Multidetector CT imaging of the abdomen and pelvis was performed following the standard protocol without IV contrast. COMPARISON:  CT 06/16/2020 FINDINGS: Lower chest: Lung bases demonstrate small bilateral pleural effusions and cardiomegaly. Hazy lung densities at the bases likely due to edema. Atelectasis at both bases. Hepatobiliary: Status post cholecystectomy. No focal hepatic abnormality. No biliary dilatation. Pancreas: Unremarkable. No pancreatic ductal dilatation or surrounding inflammatory changes. Spleen: Normal in size without focal abnormality. Adrenals/Urinary Tract: Adrenal glands are normal. Atrophic native kidneys without hydronephrosis. Stable cyst in the mid left kidney. The bladder is nearly empty Stomach/Bowel: The stomach is nonenlarged. No dilated small bowel. No acute bowel wall thickening. Diverticular disease of left colon without acute wall thickening Vascular/Lymphatic: Extensive vascular calcification. Mild retroperitoneal lymph nodes measuring up to 12 mm Reproductive: Status post hysterectomy. No adnexal masses. Other: Negative for free air. Small free fluid within the abdomen and pelvis. Extensive edema within the subcutaneous soft  tissues. Musculoskeletal: No acute osseous abnormality IMPRESSION: 1. Cardiomegaly with small bilateral pleural effusions and hazy lung densities likely due to edema. Small amount of abdominopelvic ascites. Extensive edema within the subcutaneous soft tissues consistent with anasarca. 2. Native atrophic kidneys without hydronephrosis 3. Negative for bowel obstruction or bowel wall thickening. Left colon diverticular disease without acute wall thickening Electronically Signed   By: Donavan Foil M.D.   On: 04/25/2021 20:38   DG Chest 2 View  Result Date: 04/25/2021 CLINICAL DATA:  Suspected sepsis. EXAM: CHEST - 2 VIEW COMPARISON:  Chest x-ray 03/29/2021, CT chest 08/10/2020 FINDINGS: Left dialysis catheter with tips overlying the right atrium. Left subclavian vascular stent again noted in similar position. Slightly more prominent cardiac silhouette which may be due to technique in a patient with an AP sitting radiograph. Otherwise the heart size and mediastinal contours are unchanged. No definite pericardial effusion on lateral view with limited evaluation. Question coronary stent. Bibasilar patchy airspace opacities including of the lingula. Nodule like density on lateral view. Coarsened interstitial markings with no overt pulmonary edema. Persistent bilateral trace to small volume pleural effusions. No pneumothorax. No acute osseous abnormality. IMPRESSION: 1. Persistent bilateral trace to small volume pleural effusions with associated patchy airspace opacities of bilateral lobe lower lobes and lingula likely representing infection/inflammation. Nodular-like density on lateral view in the lower lobe. Followup PA and lateral chest X-ray is recommended in 3-4 weeks following therapy to ensure resolution and exclude underlying malignancy. 2. Cardiomegaly with slightly more prominent cardiac silhouette which may be due to technique in a patient with an AP sitting radiograph. Electronically Signed   By: Iven Finn M.D.   On: 04/25/2021 17:58   DG Abd 1 View  Result Date: 04/29/2021 CLINICAL DATA:  Enteric tube placement. EXAM: ABDOMEN - 1 VIEW COMPARISON:  Abdominal x-ray dated April 26, 2021. FINDINGS: Enteric tube in the stomach with tip near the distal body. The bowel gas pattern is normal. Small bilateral pleural effusions. IMPRESSION: 1. Enteric tube in the stomach. Electronically Signed   By: Titus Dubin M.D.   On: 04/29/2021 14:21   DG Abd 1 View  Result Date: 04/26/2021 CLINICAL DATA:  Check gastric catheter placement EXAM: ABDOMEN - 1 VIEW COMPARISON:  None. FINDINGS: Scattered large and small  bowel gas is noted. Contrast material is noted within the colon. Gastric catheter is noted extending into the stomach. Changes of prior cholecystectomy are seen. IMPRESSION: Gastric catheter within the stomach. Electronically Signed   By: Inez Catalina M.D.   On: 04/26/2021 19:03   CT HEAD WO CONTRAST (5MM)  Result Date: 04/29/2021 CLINICAL DATA:  Delirium.  Sepsis. EXAM: CT HEAD WITHOUT CONTRAST TECHNIQUE: Contiguous axial images were obtained from the base of the skull through the vertex without intravenous contrast. COMPARISON:  CT head without contrast 03/29/2021 FINDINGS: Brain: No acute infarct, hemorrhage, or mass lesion is present. The ventricles are of normal size. No significant extraaxial fluid collection is present. Vascular: Atherosclerotic calcifications are present in the cavernous internal carotid arteries bilaterally. No hyperdense vessel is present. Skull: Calvarium is intact. No focal lytic or blastic lesions are present. No significant extracranial soft tissue lesion is present. Sinuses/Orbits: Small fluid level is present in the left sphenoid sinus. Asymmetric fluid is present in the left nasal cavity associated with the NG tube. Sinuses are otherwise clear. Fluid is present in the left mastoid air cells. No obstructing nasopharyngeal lesion is present. Bilateral lens replacements are  noted. Globes and orbits are otherwise unremarkable. Other: Healed IMPRESSION: 1. Normal CT appearance of the brain. 2. Small fluid level in the left sphenoid sinus. 3. Fluid in the left mastoid air cells and left nasal cavity associated with the NG tube. No obstructing nasopharyngeal lesion is present. Electronically Signed   By: San Morelle M.D.   On: 04/29/2021 15:35   PERIPHERAL VASCULAR CATHETERIZATION  Result Date: 05/07/2021 See surgical note for result.  DG Chest Port 1 View  Result Date: 04/26/2021 CLINICAL DATA:  Check endotracheal tube placement EXAM: PORTABLE CHEST 1 VIEW COMPARISON:  04/25/2021 FINDINGS: Cardiac shadow is enlarged but stable. Dialysis catheter is again noted on the left as well as prior vascular stenting on the left. Endotracheal tube is noted in satisfactory position. Gastric catheter is noted extending into the stomach. Persistent bibasilar densities are noted left greater than right with associated effusions. The overall appearance is stable from the prior exam. No bony abnormality is noted. IMPRESSION: Tubes and lines in satisfactory position. Persistent bibasilar opacity left greater than right. Electronically Signed   By: Inez Catalina M.D.   On: 04/26/2021 19:05   ECHOCARDIOGRAM COMPLETE  Result Date: 04/28/2021    ECHOCARDIOGRAM REPORT   Patient Name:   LATITIA HOUSEWRIGHT Date of Exam: 04/28/2021 Medical Rec #:  [id number]   Height:       65.0 in Accession #:    [id number]  Weight:       183.6 lb Date of Birth:  14-Aug-1961    BSA:          1.908 m Patient Age:    51 years    BP:           122/43 mmHg Patient Gender: F           HR:           58 bpm. Exam Location:  [facility] Procedure: 2D Echo, Color Doppler and Cardiac Doppler Indications:     I50.31 congestive heart failure-Acute Diastolic  History:         Patient has prior history of Echocardiogram examinations, most                  recent 06/11/2020. CAD, ESRD and COPD; Risk Factors:Hypertension  and  Diabetes.  Sonographer:     Charmayne Sheer RDCS (AE) Referring Phys:  [id number] Ottie Glazier Diagnosing Phys: Serafina Royals MD IMPRESSIONS  1. Left ventricular ejection fraction, by estimation, is 55 to 60%. The left ventricle has normal function. The left ventricle has no regional wall motion abnormalities. Left ventricular diastolic parameters were normal.  2. Right ventricular systolic function is normal. The right ventricular size is normal.  3. Left atrial size was mildly dilated.  4. Right atrial size was mildly dilated.  5. The mitral valve is normal in structure. Mild to moderate mitral valve regurgitation.  6. Tricuspid valve regurgitation is moderate.  7. The aortic valve is normal in structure. Aortic valve regurgitation is trivial. FINDINGS  Left Ventricle: Left ventricular ejection fraction, by estimation, is 55 to 60%. The left ventricle has normal function. The left ventricle has no regional wall motion abnormalities. The left ventricular internal cavity size was normal in size. There is  no left ventricular hypertrophy. Left ventricular diastolic parameters were normal. Right Ventricle: The right ventricular size is normal. No increase in right ventricular wall thickness. Right ventricular systolic function is normal. Left Atrium: Left atrial size was mildly dilated. Right Atrium: Right atrial size was mildly dilated. Pericardium: There is no evidence of pericardial effusion. Mitral Valve: The mitral valve is normal in structure. Mild to moderate mitral valve regurgitation. MV peak gradient, 6.7 mmHg. The mean mitral valve gradient is 3.0 mmHg. Tricuspid Valve: The tricuspid valve is normal in structure. Tricuspid valve regurgitation is moderate. Aortic Valve: The aortic valve is normal in structure. Aortic valve regurgitation is trivial. Aortic valve mean gradient measures 5.0 mmHg. Aortic valve peak gradient measures 9.9 mmHg. Aortic valve area, by VTI measures 1.57 cm. Pulmonic Valve: The pulmonic  valve was normal in structure. Pulmonic valve regurgitation is mild. Aorta: The aortic root and ascending aorta are structurally normal, with no evidence of dilitation. IAS/Shunts: No atrial level shunt detected by color flow Doppler.  LEFT VENTRICLE PLAX 2D LVIDd:         4.80 cm  Diastology LVIDs:         2.90 cm  LV e' medial:    6.31 cm/s LV PW:         1.00 cm  LV E/e' medial:  17.9 LV IVS:        0.60 cm  LV e' lateral:   5.33 cm/s LVOT diam:     1.60 cm  LV E/e' lateral: 21.2 LV SV:         62 LV SV Index:   33 LVOT Area:     2.01 cm  RIGHT VENTRICLE RV Basal diam:  5.20 cm RV Mid diam:    4.30 cm RV S prime:     13.50 cm/s TAPSE (M-mode): 3.1 cm LEFT ATRIUM             Index       RIGHT ATRIUM           Index LA diam:        4.80 cm 2.52 cm/m  RA Area:     16.80 cm LA Vol (A2C):   50.1 ml 26.26 ml/m RA Volume:   44.40 ml  23.27 ml/m LA Vol (A4C):   66.6 ml 34.91 ml/m LA Biplane Vol: 61.6 ml 32.29 ml/m  AORTIC VALVE                    PULMONIC VALVE AV Area (Vmax):  1.52 cm     PV Vmax:          1.19 m/s AV Area (Vmean):   1.59 cm     PV Vmean:         79.500 cm/s AV Area (VTI):     1.57 cm     PV VTI:           0.289 m AV Vmax:           157.00 cm/s  PV Peak grad:     5.7 mmHg AV Vmean:          110.000 cm/s PV Mean grad:     3.0 mmHg AV VTI:            0.396 m      PR End Diast Vel: 5.57 msec AV Peak Grad:      9.9 mmHg AV Mean Grad:      5.0 mmHg LVOT Vmax:         119.00 cm/s LVOT Vmean:        86.900 cm/s LVOT VTI:          0.310 m LVOT/AV VTI ratio: 0.78  AORTA Ao Root diam: 2.70 cm MITRAL VALVE                TRICUSPID VALVE MV Area (PHT): 3.23 cm     TR Peak grad:   55.1 mmHg MV Area VTI:   1.51 cm     TR Vmax:        371.00 cm/s MV Peak grad:  6.7 mmHg MV Mean grad:  3.0 mmHg     SHUNTS MV Vmax:       1.29 m/s     Systemic VTI:  0.31 m MV Vmean:      70.7 cm/s    Systemic Diam: 1.60 cm MV Decel Time: 235 msec MV E velocity: 113.00 cm/s MV A velocity: 79.70 cm/s MV E/A ratio:  1.42 Serafina Royals MD Electronically signed by Serafina Royals MD Signature Date/Time: 04/28/2021/7:33:07 PM    Final    US Abdomen Limited RUQ (LIVER/GB)  Result Date: 04/28/2021 CLINICAL DATA:  Right upper quadrant pain EXAM: ULTRASOUND ABDOMEN LIMITED RIGHT UPPER QUADRANT COMPARISON:  CT 04/25/2021 FINDINGS: Gallbladder: Surgically absent Common bile duct: Diameter: 5.9 mm Liver: Coarse hepatic echotexture with possible contour nodularity. No focal hepatic abnormality. Portal vein is patent on color Doppler imaging with normal direction of blood flow towards the liver. Other: Small amount of perihepatic ascites.  Atrophic right kidney. IMPRESSION: 1. Status post cholecystectomy.  No biliary dilatation 2. Coarse hepatic echotexture with questionable nodularity suggesting underlying hepatic parenchymal disease and potential early cirrhosis changes. Small amount of perihepatic ascites. Electronically Signed   By: Donavan Foil M.D.   On: 04/28/2021 22:36    Microbiology: Recent Results (from the past 240 hour(s))  Resp Panel by RT-PCR (Flu A&B, Covid) Nasopharyngeal Swab     Status: None   Collection Time: 05/09/21  4:08 PM   Specimen: Nasopharyngeal Swab; Nasopharyngeal(NP) swabs in vial transport medium  Result Value Ref Range Status   SARS Coronavirus 2 by RT PCR NEGATIVE NEGATIVE Final    Comment: (NOTE) SARS-CoV-2 target nucleic acids are NOT DETECTED.  The SARS-CoV-2 RNA is generally detectable in upper respiratory specimens during the acute phase of infection. The lowest concentration of SARS-CoV-2 viral copies this assay can detect is 138 copies/mL. A negative result does not preclude SARS-Cov-2 infection and should not be used as the  sole basis for treatment or other patient management decisions. A negative result may occur with  improper specimen collection/handling, submission of specimen other than nasopharyngeal swab, presence of viral mutation(s) within the areas targeted by this assay, and  inadequate number of viral copies(<138 copies/mL). A negative result must be combined with clinical observations, patient history, and epidemiological information. The expected result is Negative.  Fact Sheet for Patients:  EntrepreneurPulse.com.au  Fact Sheet for Healthcare Providers:  IncredibleEmployment.be  This test is no t yet approved or cleared by the [place] FDA and  has been authorized for detection and/or diagnosis of SARS-CoV-2 by FDA under an Emergency Use Authorization (EUA). This EUA will remain  in effect (meaning this test can be used) for the duration of the COVID-19 declaration under Section 564(b)(1) of the Act, 21 U.S.C.section 360bbb-3(b)(1), unless the authorization is terminated  or revoked sooner.       Influenza A by PCR NEGATIVE NEGATIVE Final   Influenza B by PCR NEGATIVE NEGATIVE Final    Comment: (NOTE) The Xpert Xpress SARS-CoV-2/FLU/RSV plus assay is intended as an aid in the diagnosis of influenza from Nasopharyngeal swab specimens and should not be used as a sole basis for treatment. Nasal washings and aspirates are unacceptable for Xpert Xpress SARS-CoV-2/FLU/RSV testing.  Fact Sheet for Patients: EntrepreneurPulse.com.au  Fact Sheet for Healthcare Providers: IncredibleEmployment.be  This test is not yet approved or cleared by the [place] FDA and has been authorized for detection and/or diagnosis of SARS-CoV-2 by FDA under an Emergency Use Authorization (EUA). This EUA will remain in effect (meaning this test can be used) for the duration of the COVID-19 declaration under Section 564(b)(1) of the Act, 21 U.S.C. section 360bbb-3(b)(1), unless the authorization is terminated or revoked.  Performed at [facility], [address] [place], [place] [address]      Labs: Basic Metabolic Panel: Recent Labs  Lab 05/06/21 0559 05/07/21 0718  05/08/21 0454 05/09/21 0716 05/10/21 0612  NA 134* 132* 135 132* 130*  K 4.7 3.9 4.2 3.9 4.4  CL 96* 95* 98 95* 95*  CO2 28 26 28 28 28   GLUCOSE 141* 78 128* 110* 92  BUN 46* 28* 35* 22 26*  CREATININE 4.04* 3.14* 3.93* 2.91* 3.73*  CALCIUM 8.4* 7.8* 7.9* 8.0* 8.2*  MG 2.2 2.1 2.1 2.0 2.0  PHOS 4.9* 3.7 4.9* 3.6 3.8   Liver Function Tests: Recent Labs  Lab 05/06/21 0559 05/07/21 0718 05/08/21 0454 05/09/21 0716 05/10/21 0612  ALBUMIN 2.5* 2.1* 2.1* 2.3* 2.4*   No results for input(s): LIPASE, AMYLASE in the last 168 hours. No results for input(s): AMMONIA in the last 168 hours. CBC: Recent Labs  Lab 05/08/21 1105  WBC 6.2  HGB 8.1*  HCT 25.4*  MCV 87.0  PLT 180   Cardiac Enzymes: No results for input(s): CKTOTAL, CKMB, CKMBINDEX, TROPONINI in the last 168 hours. BNP: BNP (last 3 results) Recent Labs    03/17/21 2022 03/29/21 0025 04/25/21 1651  BNP 727.9* 1,338.0* 821.4*    ProBNP (last 3 results) No results for input(s): PROBNP in the last 8760 hours.  CBG: Recent Labs  Lab 05/09/21 2150 05/10/21 0052 05/10/21 0419 05/10/21 0746 05/10/21 1056  GLUCAP 110* 119* 105* 86 96       Signed:  Desma Maxim MD.  [facility] 05/10/2021, 2:16 PM

## 2021-05-10 NOTE — TOC Progression Note (Addendum)
Transition of Care [facility]) - Progression Note    Patient Details  Name: [name] MRN: [id number] Date of Birth: 1961-01-08  [facility]) CM/SW Contact  Candie Chroman, LCSW Phone Number: 05/10/2021, 8:35 AM  Clinical Narrative:  Insurance authorization approved for [employer]: [id number]. Valid 8/19-8/23. COVID negative.   11:03 pm: Received call back from son. Provided update. He wants to make sure SNF will be able to order all of her medications due to issues in the past. He has to go to work at 3:00 today so admissions coordinator will call him to set up time for paperwork. Son also concerned about elevated BP last night (190's). Per MD, that should improve with HD today. SNF wants her to get booster before she admits if she is agreeable. RN was not able to ask her before she went down to HD.  1:53 pm: Patient is agreeable to COVID booster. Sent secure chat to MD and pharmacist to notify. MD aware discharge summary needs to be in by 3:00.  2:31 pm: Faxed discharge summary to HD coordinator so she could send to HD center.  Expected Discharge Plan: [place] Barriers to Discharge: Continued Medical Work up  Expected Discharge Plan and Services Expected Discharge Plan: [place] In-house Referral: NA   Post Acute Care Choice: [place] Living arrangements for the past 2 months: Single Family Home                                       Social Determinants of Health (SDOH) Interventions    Readmission Risk Interventions Readmission Risk Prevention Plan 05/03/2021 12/11/2020 09/21/2020  Transportation Screening Complete Complete Complete  Medication Review Press photographer) Complete Complete Complete  PCP or Specialist appointment within 3-5 days of discharge - Complete Complete  HRI or Home Care Consult - Complete Complete  SW Recovery Care/Counseling Consult - Complete Complete  Palliative Care Screening  Complete Not Applicable Complete  Skilled Nursing Facility - Not Complete Complete  SNF Comments - working on placement -  Some recent data might be hidden

## 2021-05-10 NOTE — Progress Notes (Signed)
OT Cancellation Note  Patient Details Name: [name] MRN: [id number] DOB: 1960-11-17   Cancelled Treatment:    Reason Eval/Treat Not Completed: Patient at procedure or test/ unavailable Pt OTF to HD. Will f/u at later date/time.   Gerrianne Scale, [place], OTR/L ascom [phone] 05/10/21, 11:35 AM

## 2021-05-10 NOTE — Progress Notes (Signed)
[name] MRN: [id number] DOB: 1961-08-12   Cancelled Treatment:     PT attempt. Pt off floor for HD   Willette Pa 05/10/2021, 10:29 AM

## 2021-05-10 NOTE — TOC Transition Note (Signed)
Transition of Care [facility]) - CM/SW Discharge Note   Patient Details  Name: [name] MRN: [id number] Date of Birth: [birth year]  Transition of Care [facility]) CM/SW Contact:  Candie Chroman, LCSW Phone Number: 05/10/2021, 4:17 PM   Clinical Narrative:  Patient has orders to discharge to [facility] today. Faxed COVID booster information to admissions coordinator. RN will call report to [phone] (Room 509). EMS transport has been arranged and she is 5th on the list. Left voicemail for son to let him know. No further concerns. CSW signing off.  Final next level of care: Skilled Nursing Facility Barriers to Discharge: Barriers Resolved   Patient Goals and CMS Choice     Choice offered to / list presented to : Patient, Adult Children  Discharge Placement   Existing PASRR number confirmed : 05/08/21          Patient [facility] bed at: [facility] Patient to be transferred to facility by: EMS Name of family member notified: Shajuan Musso Patient and family notified of of transfer: 05/10/21  Discharge Plan and Services In-house Referral: NA   Post Acute Care Choice: [place]                               Social Determinants of Health (SDOH) Interventions     Readmission Risk Interventions Readmission Risk Prevention Plan 05/03/2021 12/11/2020 09/21/2020  Transportation Screening Complete Complete Complete  Medication Review Press photographer) Complete Complete Complete  PCP or Specialist appointment within 3-5 days of discharge - Complete Complete  HRI or Home Care Consult - Complete Complete  SW Recovery Care/Counseling Consult - Complete Complete  Palliative Care Screening Complete Not Applicable Complete  Skilled Nursing Facility - Not Complete Complete  SNF Comments - working on placement -  Some recent data might be hidden

## 2021-05-14 ENCOUNTER — Inpatient Hospital Stay: Payer: Medicare Other | Admitting: Oncology

## 2021-05-20 ENCOUNTER — Telehealth (INDEPENDENT_AMBULATORY_CARE_PROVIDER_SITE_OTHER): Payer: Self-pay | Admitting: Vascular Surgery

## 2021-05-20 NOTE — Telephone Encounter (Signed)
Perma cath placed on 8/16 per Dr. Delana Meyer.  Patient is calling in for a follow up appointment.  Patient states she is supposed to have one, but didn't see f/u instructions.  Patient does state the site is sore. Please advise.

## 2021-05-20 NOTE — Telephone Encounter (Signed)
Please schedule for follow up  Please advise.

## 2021-05-21 NOTE — Telephone Encounter (Signed)
Per Dr. Delana Meyer, in a couple of weeks with no studies to see him

## 2021-05-22 NOTE — Telephone Encounter (Signed)
Please schedule

## 2021-06-04 ENCOUNTER — Other Ambulatory Visit: Payer: Self-pay

## 2021-06-04 ENCOUNTER — Emergency Department: Payer: Medicare Other

## 2021-06-04 ENCOUNTER — Emergency Department
Admission: EM | Admit: 2021-06-04 | Discharge: 2021-06-05 | Disposition: A | Discharge status: Home / Self Care | Payer: Medicare Other | Attending: Emergency Medicine | Admitting: Emergency Medicine

## 2021-06-04 DIAGNOSIS — Z992 Dependence on renal dialysis: Secondary | ICD-10-CM | POA: Diagnosis not present

## 2021-06-04 DIAGNOSIS — I5032 Chronic diastolic (congestive) heart failure: Secondary | ICD-10-CM | POA: Diagnosis not present

## 2021-06-04 DIAGNOSIS — Z7951 Long term (current) use of inhaled steroids: Secondary | ICD-10-CM | POA: Diagnosis not present

## 2021-06-04 DIAGNOSIS — E1165 Type 2 diabetes mellitus with hyperglycemia: Secondary | ICD-10-CM | POA: Diagnosis not present

## 2021-06-04 DIAGNOSIS — Z79899 Other long term (current) drug therapy: Secondary | ICD-10-CM | POA: Diagnosis not present

## 2021-06-04 DIAGNOSIS — I2510 Atherosclerotic heart disease of native coronary artery without angina pectoris: Secondary | ICD-10-CM | POA: Insufficient documentation

## 2021-06-04 DIAGNOSIS — R0602 Shortness of breath: Secondary | ICD-10-CM | POA: Insufficient documentation

## 2021-06-04 DIAGNOSIS — E1122 Type 2 diabetes mellitus with diabetic chronic kidney disease: Secondary | ICD-10-CM | POA: Insufficient documentation

## 2021-06-04 DIAGNOSIS — I132 Hypertensive heart and chronic kidney disease with heart failure and with stage 5 chronic kidney disease, or end stage renal disease: Secondary | ICD-10-CM | POA: Insufficient documentation

## 2021-06-04 DIAGNOSIS — Z85528 Personal history of other malignant neoplasm of kidney: Secondary | ICD-10-CM | POA: Insufficient documentation

## 2021-06-04 DIAGNOSIS — Z20822 Contact with and (suspected) exposure to covid-19: Secondary | ICD-10-CM | POA: Diagnosis not present

## 2021-06-04 DIAGNOSIS — R0789 Other chest pain: Secondary | ICD-10-CM | POA: Diagnosis not present

## 2021-06-04 DIAGNOSIS — J449 Chronic obstructive pulmonary disease, unspecified: Secondary | ICD-10-CM | POA: Diagnosis not present

## 2021-06-04 DIAGNOSIS — Z7982 Long term (current) use of aspirin: Secondary | ICD-10-CM | POA: Insufficient documentation

## 2021-06-04 DIAGNOSIS — J45909 Unspecified asthma, uncomplicated: Secondary | ICD-10-CM | POA: Insufficient documentation

## 2021-06-04 DIAGNOSIS — N186 End stage renal disease: Secondary | ICD-10-CM

## 2021-06-04 LAB — TROPONIN I (HIGH SENSITIVITY)
Troponin I (High Sensitivity): 5 ng/L (ref <18)
Troponin I (High Sensitivity): 5 ng/L (ref <18)

## 2021-06-04 LAB — BASIC METABOLIC PANEL
Anion gap: 10 (ref 5–15)
BUN: 12 mg/dL (ref 8–23)
CO2: 26 mmol/L (ref 22–32)
Calcium: 8.1 mg/dL — ABNORMAL LOW (ref 8.9–10.3)
Chloride: 95 mmol/L — ABNORMAL LOW (ref 98–111)
Creatinine, Ser: 3.26 mg/dL — ABNORMAL HIGH (ref 0.44–1.00)
GFR, Estimated: 15 mL/min — ABNORMAL LOW (ref >60)
Glucose, Bld: 85 mg/dL (ref 70–99)
Potassium: 3.8 mmol/L (ref 3.5–5.1)
Sodium: 131 mmol/L — ABNORMAL LOW (ref 135–145)

## 2021-06-04 LAB — CBC
HCT: 29.9 % — ABNORMAL LOW (ref 36.0–46.0)
Hemoglobin: 9.6 g/dL — ABNORMAL LOW (ref 12.0–15.0)
MCH: 28.6 pg (ref 26.0–34.0)
MCHC: 32.1 g/dL (ref 30.0–36.0)
MCV: 89 fL (ref 80.0–100.0)
Platelets: 140 10*3/uL — ABNORMAL LOW (ref 150–400)
RBC: 3.36 MIL/uL — ABNORMAL LOW (ref 3.87–5.11)
RDW: 18.4 % — ABNORMAL HIGH (ref 11.5–15.5)
WBC: 5.2 10*3/uL (ref 4.0–10.5)
nRBC: 0 % (ref 0.0–0.2)

## 2021-06-04 LAB — HEPATIC FUNCTION PANEL
ALT: 10 U/L (ref 0–44)
AST: 16 U/L (ref 15–41)
Albumin: 2.6 g/dL — ABNORMAL LOW (ref 3.5–5.0)
Alkaline Phosphatase: 86 U/L (ref 38–126)
Bilirubin, Direct: <0.1 mg/dL (ref 0.0–0.2)
Total Bilirubin: 0.7 mg/dL (ref 0.3–1.2)
Total Protein: 7 g/dL (ref 6.5–8.1)

## 2021-06-04 LAB — RESP PANEL BY RT-PCR (FLU A&B, COVID) ARPGX2
Influenza A by PCR: NEGATIVE
Influenza B by PCR: NEGATIVE
SARS Coronavirus 2 by RT PCR: NEGATIVE

## 2021-06-04 LAB — LIPASE, BLOOD: Lipase: 24 U/L (ref 11–51)

## 2021-06-04 MED ORDER — NITROGLYCERIN 0.4 MG SL SUBL
0.4000 mg | SUBLINGUAL_TABLET | SUBLINGUAL | Status: DC | PRN
  Filled 2021-06-04: qty 1

## 2021-06-04 MED ORDER — FUROSEMIDE 10 MG/ML IJ SOLN
80.0000 mg | Freq: Once | INTRAMUSCULAR | Status: AC
  Administered 2021-06-04: 80 mg via INTRAVENOUS
  Filled 2021-06-04: qty 8

## 2021-06-04 MED ORDER — NITROGLYCERIN 0.4 MG SL SUBL: 0.4000 mg | SUBLINGUAL_TABLET | Freq: Once | SUBLINGUAL | Status: DC

## 2021-06-04 MED ORDER — ACETAMINOPHEN 500 MG PO TABS
1000.0000 mg | ORAL_TABLET | Freq: Once | ORAL | Status: DC
  Filled 2021-06-04: qty 2

## 2021-06-04 MED ORDER — MORPHINE SULFATE (PF) 4 MG/ML IV SOLN
4.0000 mg | Freq: Once | INTRAVENOUS | Status: AC
  Administered 2021-06-04: 4 mg via INTRAVENOUS
  Filled 2021-06-04: qty 1

## 2021-06-04 NOTE — ED Provider Notes (Signed)
[facility] Emergency Department Provider Note ____________________________________________   Event Date/Time   First MD Initiated Contact with Patient 06/04/21 1640     (approximate)  I have reviewed the triage vital signs and the nursing notes.  HISTORY  Chief Complaint Chest Pain (SINCE YESTERDAY AFTER DIALYSIS )   HPI [name] is a 60 y.o. femalewho presents to the ED for evaluation of chest pain.  Chart review indicates ESRD on MWF dialysis, DM, and recent medical admission for septic shock due to Acinetobacter bacteremia by blood culture.  Had a permacath removed on 8/6, treated with Unasyn for 2 weeks She returned home, where she lives by herself, from rehab 9 days ago.  Reports adherence to dialysis, finishing her session yesterday.  Patient presents to the ED for evaluation of sharp chest pain since yesterday.  She reports that developed towards the end of the day yesterday, been progressively worsening throughout the day today.  She reports a sensation of shortness of breath alongside this.   She reports that she is currently on doxycycline antibiotic to treat a pneumonia, started just when she got home from rehab about a week ago.  Denies fevers, productive cough, syncopal episodes, emesis or abdominal pain.  Past Medical History:  Diagnosis Date   Anemia    Anginal pain (HCC)    Anxiety    Arthritis    Asthma    Broken wrist    Bronchitis    chronic diastolic CHF 8/50/2774   Chronic kidney disease    COPD (chronic obstructive pulmonary disease) ([place])    Coronary artery disease    a. cath 2013: stenting to RCA (report not available); b. cath 2014: LM nl, pLAD 40%, mLAD nl, ost LCx 40%, mid LCx nl, pRCA 30% @ site of prior stent, mRCA 50%   Depression    Diabetes mellitus ([place])    Diabetes mellitus without complication ([place])    Diabetic neuropathy ([place])    dialysis 2006   Diverticulosis    Dizziness    Dyspnea    Elevated lipids     Environmental and seasonal allergies    ESRD (end stage renal disease) on dialysis ([place])    M-W-F   Gastroparesis    GERD (gastroesophageal reflux disease)    Headache    History of anemia due to chronic kidney disease    History of hiatal hernia    HOH (hard of hearing)    Hx of pancreatitis 2015   Hypertension    Lower extremity edema    Mitral regurgitation    a. echo 10/2013: EF 62%, noWMA, mildly dilated LA, mild to mod MR/TR, GR1DD   Myocardial infarction ([place])    Orthopnea    Parathyroid abnormality ([place])    Peripheral arterial disease ([place])    Pneumonia    Renal cancer ([place])    Renal insufficiency    Pt is on dialysis on M,W + F.   Wheezing     Patient Active Problem List   Diagnosis Date Noted   Anemia of chronic kidney failure 05/03/2021   Thrombocytopenia ([place]) 05/03/2021   Severe sepsis with septic shock ([place]) 04/25/2021   HCAP (healthcare-associated pneumonia) 12/87/8676   Acute metabolic encephalopathy 72/05/4708   UTI (urinary tract infection) 03/29/2021   Chronic respiratory failure with hypoxia ([place]) 12/18/2020   Superior vena caval syndrome 12/14/2020   Chronic diastolic CHF (congestive heart failure) ([place]) 12/04/2020   Bradycardia 09/17/2020   Advanced care planning/counseling discussion  Discitis of thoracic region    Fluid overload 08/10/2020   HLD (hyperlipidemia) 08/10/2020   Chest pain 08/10/2020   CAD (coronary artery disease) 08/10/2020   GERD (gastroesophageal reflux disease) 08/10/2020   Obesity (BMI 30-39.9) 06/06/2020   Anemia in ESRD (end-stage renal disease) ([place]) 05/31/2020   Type 1 diabetes mellitus ([place]) 05/31/2020   End-stage renal disease ([place]) 05/31/2020   Hypertensive disorder 05/31/2020   Arm pain 05/21/2020   Cellulitis of right leg 05/02/2020   ESRD (end stage renal disease) ([place]) 04/13/2020   SOBOE (shortness of breath on exertion) 02/08/2020   Hematoma of arm, left, subsequent encounter 11/24/2019   Respiratory  failure (HCC)    Shortness of breath    Complication of arteriovenous dialysis fistula 11/13/2019   PVD (peripheral vascular disease) ([place]) 11/13/2019   Severe anemia 11/13/2019   Pressure ulcer, stage 2 ([place]) 11/13/2019   ESRD on hemodialysis ([place])    Hemorrhagic shock ([place]) 11/11/2019   Leg pain 10/02/2019   CAP (community acquired pneumonia) 10/01/2019   Atherosclerosis of native arteries of extremity with intermittent claudication ([place]) 05/01/2019   Right-sided headache    COPD (chronic obstructive pulmonary disease) ([place]) 10/06/2018   Syncope 10/04/2018   Symptomatic anemia 06/18/2018   Gastroparesis due to DM ([place]) 09/38/1829   Complication of vascular access for dialysis 12/04/2017   Osteomyelitis ([place]) 09/30/2017   Carotid stenosis 06/18/2017   Bilateral carotid artery stenosis 06/16/2017   Benign essential HTN 05/19/2017   CKD (chronic kidney disease) stage 5, GFR less than 15 ml/min (HCC) 05/19/2017   Hyperlipidemia, mixed 05/19/2017   Shortness of breath 05/04/2017   Rash, skin 12/16/2016   Cellulitis of lower extremity 07/29/2016   Chronic venous insufficiency 07/29/2016   Lymphedema 07/29/2016   TIA (transient ischemic attack) 04/21/2016   Altered mental status 04/08/2016   Hyperammonemia ([place]) 04/08/2016   Elevated troponin 04/08/2016   Depression 04/08/2016   Depression, major, recurrent, severe with psychosis ([place]) 04/08/2016   Blood in stool    Intractable cyclical vomiting with nausea    Reflux esophagitis    Gastritis    Generalized abdominal pain    Uncontrollable vomiting    Major depressive disorder, recurrent episode, moderate ([place]) 03/15/2016   Adjustment disorder with mixed anxiety and depressed mood 03/15/2016   Malnutrition of moderate degree 12/01/2015   Renal mass    Dyspnea    Acute renal failure ([place])    Respiratory failure ([place])    High temperature 11/14/2015   Encounter for central line placement    Encounter for orogastric (OG) tube  placement    Nausea 11/12/2015   Hyperkalemia 10/03/2015   Diarrhea, unspecified 07/22/2015   Pneumonia 05/21/2015   Hypoglycemia 04/24/2015   Unresponsiveness 04/24/2015   Sinus bradycardia 04/24/2015   Hypothermia 04/24/2015   Acute hypoxemic respiratory failure ([place]) 04/24/2015   Acute on chronic diastolic CHF (congestive heart failure) ([place]) 04/05/2015   Diabetic gastroparesis ([place]) 04/05/2015   Hypokalemia 04/05/2015   Generalized weakness 04/05/2015   Acute pulmonary edema (HCC) 04/03/2015   Nausea and vomiting 04/03/2015   Hypoglycemia associated with diabetes ([place]) 04/03/2015   Anemia of chronic disease 04/03/2015   Secondary hyperparathyroidism ([place]) 04/03/2015   Pressure ulcer 04/02/2015   Acute respiratory failure with hypoxia ([place]) 04/01/2015   Adjustment disorder with anxiety 03/14/2015   Somatic symptom disorder, mild 03/08/2015   Coronary artery disease involving native coronary artery of native heart without angina pectoris    Nausea & vomiting 03/06/2015   Abdominal  pain 03/06/2015   Diabetes mellitus, type II ([place]) 03/06/2015   HTN (hypertension) 03/06/2015   Gastroparesis 02/24/2015   Pleural effusion 02/19/2015   Bacteremia due to Enterococcus 02/19/2015   End-stage renal disease on hemodialysis ([place]) 02/19/2015   Diarrhea 08/01/2014    Past Surgical History:  Procedure Laterality Date   A/V SHUNTOGRAM Left 01/20/2018   Procedure: A/V SHUNTOGRAM;  Surgeon: Algernon Huxley, MD;  Location: [place] [facility];  Service: Cardiovascular;  Laterality: Left;   ABDOMINAL HYSTERECTOMY  1992   AMPUTATION TOE Left 10/02/2017   Procedure: AMPUTATION TOE-LEFT GREAT TOE;  Surgeon: Albertine Patricia, DPM;  Location: [facility];  Service: Podiatry;  Laterality: Left;   APPENDECTOMY     APPLICATION OF WOUND VAC N/A 11/25/2019   Procedure: APPLICATION OF WOUND VAC;  Surgeon: Katha Cabal, MD;  Location: [facility];  Service: Vascular;  Laterality: N/A;   ARTERY BIOPSY Right  10/11/2018   Procedure: BIOPSY TEMPORAL ARTERY;  Surgeon: Vickie Epley, MD;  Location: [facility];  Service: General;  Laterality: Right;   CARDIAC CATHETERIZATION Left 07/26/2015   Procedure: Left Heart Cath and Coronary Angiography;  Surgeon: Dionisio David, MD;  Location: [place] [facility];  Service: Cardiovascular;  Laterality: Left;   CATARACT EXTRACTION W/ INTRAOCULAR LENS IMPLANT Right    CATARACT EXTRACTION W/PHACO Left 03/10/2017   Procedure: CATARACT EXTRACTION PHACO AND INTRAOCULAR LENS PLACEMENT ([place]);  Surgeon: Birder Robson, MD;  Location: [facility];  Service: Ophthalmology;  Laterality: Left;  [place] 00:51.9 AP% 14.2 CDE 7.39 fluid pack lot # [id number]   CENTRAL LINE INSERTION Right 11/11/2019   Procedure: CENTRAL LINE INSERTION;  Surgeon: Katha Cabal, MD;  Location: [facility];  Service: Vascular;  Laterality: Right;   CENTRAL LINE INSERTION  11/25/2019   Procedure: CENTRAL LINE INSERTION;  Surgeon: Katha Cabal, MD;  Location: [facility];  Service: Vascular;;   CHOLECYSTECTOMY     COLONOSCOPY WITH PROPOFOL N/A 08/12/2016   Procedure: COLONOSCOPY WITH PROPOFOL;  Surgeon: Lollie Sails, MD;  Location: [facility];  Service: Endoscopy;  Laterality: N/A;   DIALYSIS FISTULA CREATION Left    upper arm   dialysis grafts     DIALYSIS/PERMA CATHETER INSERTION N/A 11/14/2019   Procedure: DIALYSIS/PERMA CATHETER INSERTION;  Surgeon: Algernon Huxley, MD;  Location: [place] [facility];  Service: Cardiovascular;  Laterality: N/A;   DIALYSIS/PERMA CATHETER INSERTION N/A 02/03/2020   Procedure: DIALYSIS/PERMA CATHETER INSERTION;  Surgeon: Katha Cabal, MD;  Location: [place] [facility];  Service: Cardiovascular;  Laterality: N/A;   DIALYSIS/PERMA CATHETER INSERTION N/A 06/19/2020   Procedure: DIALYSIS/PERMA CATHETER INSERTION;  Surgeon: Katha Cabal, MD;  Location: [place] [facility];  Service: Cardiovascular;  Laterality: N/A;   DIALYSIS/PERMA CATHETER INSERTION  N/A 05/07/2021   Procedure: DIALYSIS/PERMA CATHETER INSERTION;  Surgeon: Katha Cabal, MD;  Location: [place] [facility];  Service: Cardiovascular;  Laterality: N/A;  Patient needs general anethesia   DIALYSIS/PERMA CATHETER REMOVAL N/A 05/25/2020   Procedure: DIALYSIS/PERMA CATHETER REMOVAL;  Surgeon: Katha Cabal, MD;  Location: [place] [facility];  Service: Cardiovascular;  Laterality: N/A;   ESOPHAGOGASTRODUODENOSCOPY N/A 03/08/2015   Procedure: ESOPHAGOGASTRODUODENOSCOPY (EGD);  Surgeon: Manya Silvas, MD;  Location: [facility] ENDOSCOPY;  Service: Endoscopy;  Laterality: N/A;   ESOPHAGOGASTRODUODENOSCOPY (EGD) WITH PROPOFOL N/A 03/18/2016   Procedure: ESOPHAGOGASTRODUODENOSCOPY (EGD) WITH PROPOFOL;  Surgeon: [name], MD;  Location: [facility];  Service: Endoscopy;  Laterality: N/A;   EYE SURGERY Right 2018  FECAL TRANSPLANT N/A 08/23/2015   Procedure: FECAL TRANSPLANT;  Surgeon: Manya Silvas, MD;  Location: [facility] ENDOSCOPY;  Service: Endoscopy;  Laterality: N/A;   HAND SURGERY Bilateral    HEMATOMA EVACUATION Left 11/25/2019   Procedure: EVACUATION HEMATOMA;  Surgeon: Katha Cabal, MD;  Location: [facility];  Service: Vascular;  Laterality: Left;   I & D EXTREMITY Left 11/25/2019   Procedure: IRRIGATION AND DEBRIDEMENT EXTREMITY;  Surgeon: Katha Cabal, MD;  Location: [facility];  Service: Vascular;  Laterality: Left;   IR FLUORO GUIDE CV LINE RIGHT  04/06/2020   IR INJECT/THERA/INC NEEDLE/CATH/PLC EPI/CERV/THOR W/IMG  08/13/2020   IR RADIOLOGIST EVAL & MGMT  07/28/2019   IR RADIOLOGIST EVAL & MGMT  08/11/2019   LIGATION OF ARTERIOVENOUS  FISTULA Left 11/11/2019   LIGATION OF ARTERIOVENOUS  FISTULA Left 11/11/2019   Procedure: LIGATION OF ARTERIOVENOUS  FISTULA;  Surgeon: Katha Cabal, MD;  Location: [facility];  Service: Vascular;  Laterality: Left;   LIGATIONS OF HERO GRAFT Right 06/13/2020   Procedure: LIGATION / REMOVAL OF RIGHT HERO GRAFT;  Surgeon: Katha Cabal, MD;  Location: [facility];  Service: Vascular;  Laterality: Right;   PERIPHERAL VASCULAR CATHETERIZATION N/A 12/20/2015   Procedure: Thrombectomy of dialysis access versus permcath placement;  Surgeon: Algernon Huxley, MD;  Location: [place] [facility];  Service: Cardiovascular;  Laterality: N/A;   PERIPHERAL VASCULAR CATHETERIZATION N/A 12/20/2015   Procedure: A/V Shunt Intervention;  Surgeon: Algernon Huxley, MD;  Location: [place] [facility];  Service: Cardiovascular;  Laterality: N/A;   PERIPHERAL VASCULAR CATHETERIZATION N/A 12/20/2015   Procedure: A/V Shuntogram/Fistulagram;  Surgeon: Algernon Huxley, MD;  Location: [place] [facility];  Service: Cardiovascular;  Laterality: N/A;   PERIPHERAL VASCULAR CATHETERIZATION N/A 01/02/2016   Procedure: A/V Shuntogram/Fistulagram;  Surgeon: Algernon Huxley, MD;  Location: [place] [facility];  Service: Cardiovascular;  Laterality: N/A;   PERIPHERAL VASCULAR CATHETERIZATION N/A 01/02/2016   Procedure: A/V Shunt Intervention;  Surgeon: Algernon Huxley, MD;  Location: [place] [facility];  Service: Cardiovascular;  Laterality: N/A;   TEE WITHOUT CARDIOVERSION N/A 06/11/2020   Procedure: TRANSESOPHAGEAL ECHOCARDIOGRAM (TEE);  Surgeon: Teodoro Spray, MD;  Location: [facility];  Service: Cardiovascular;  Laterality: N/A;   UPPER EXTREMITY ANGIOGRAPHY Bilateral 11/27/2020   Procedure: UPPER EXTREMITY ANGIOGRAPHY;  Surgeon: Katha Cabal, MD;  Location: [place] [facility];  Service: Cardiovascular;  Laterality: Bilateral;   UPPER EXTREMITY VENOGRAPHY Right 01/18/2020   Procedure: UPPER EXTREMITY VENOGRAPHY;  Surgeon: Katha Cabal, MD;  Location: [place] [facility];  Service: Cardiovascular;  Laterality: Right;   UPPER EXTREMITY VENOGRAPHY Bilateral 11/27/2020   Procedure: UPPER EXTREMITY VENOGRAPHY;  Surgeon: Katha Cabal, MD;  Location: [place] [facility];  Service: Cardiovascular;  Laterality: Bilateral;   VASCULAR ACCESS DEVICE INSERTION Right  04/13/2020   Procedure: INSERTION OF HERO VASCULAR ACCESS DEVICE (GRAFT);  Surgeon: Katha Cabal, MD;  Location: [facility];  Service: Vascular;  Laterality: Right;    Prior to Admission medications   Medication Sig Start Date End Date Taking? Authorizing Provider  albuterol (PROVENTIL) (2.5 MG/3ML) 0.083% nebulizer solution Take 2.5 mg by nebulization every 4 (four) hours as needed for wheezing or shortness of breath.    [provider]  albuterol (VENTOLIN HFA) 108 (90 Base) MCG/ACT inhaler Inhale 2 puffs into the lungs every 4 (four) hours as needed for wheezing or shortness of breath. 11/09/19   [provider]  alum & mag hydroxide-simeth (MAALOX/MYLANTA) 200-200-20 MG/5ML suspension Take 15 mLs by mouth every 6 (six) hours as needed for indigestion or heartburn. 06/25/20   Enzo Bi, MD  amLODipine (NORVASC) 10 MG tablet Take 1 tablet (10 mg total) by mouth daily. 06/25/20   Enzo Bi, MD  ammonium lactate (LAC-HYDRIN) 12 % lotion Apply 1 application topically 2 (two) times daily as needed for dry skin.     [provider]  aspirin EC 81 MG tablet Take 81 mg by mouth daily.    [provider]  atorvastatin (LIPITOR) 20 MG tablet Take 20 mg by mouth every evening.  10/24/19   [provider]  Biotin 1 MG CAPS Take 1 mg by mouth daily.    [provider]  calcium carbonate (TUMS - DOSED IN MG ELEMENTAL CALCIUM) 500 MG chewable tablet Chew 2.5 tablets (500 mg of elemental calcium total) by mouth every 6 (six) hours as needed for indigestion. 06/25/20   Enzo Bi, MD  Cholecalciferol (VITAMIN D3) 25 MCG (1000 UT) CAPS Take 1,000 Units by mouth daily.     [provider]  cyanocobalamin 1000 MCG tablet Take 1,000 mcg by mouth daily.     [provider]  diphenhydrAMINE (BENADRYL) 25 MG tablet Take 25 mg by mouth every 6 (six) hours as needed for itching.    [provider]  epoetin alfa (EPOGEN) 10000 UNIT/ML injection  Inject 1 mL (10,000 Units total) into the vein every Monday, Wednesday, and Friday with hemodialysis. 06/27/20   Enzo Bi, MD  Ferrous Sulfate (IRON) 325 (65 Fe) MG TABS Take 325 mg by mouth daily.     [provider]  gabapentin (NEURONTIN) 100 MG capsule Take 100 mg by mouth at bedtime.    [provider]  HYDROcodone-acetaminophen (NORCO/VICODIN) 5-325 MG tablet Take 1 tablet by mouth 3 (three) times daily as needed for moderate pain or severe pain. 05/10/21   Wouk, Ailene Rud, MD  hydrOXYzine (ATARAX/VISTARIL) 25 MG tablet Take 25 mg by mouth daily as needed for itching.    [provider]  lidocaine (LIDODERM) 5 % Place 1 patch onto the skin daily. (Remove after 12 hours -- leave off for 12 hours before applying new patch)    [provider]  meclizine (ANTIVERT) 25 MG tablet Take 25 mg by mouth every 6 (six) hours as needed for dizziness.    [provider]  mirtazapine (REMERON) 7.5 MG tablet Take 7.5 mg by mouth daily. 04/15/21   [provider]  multivitamin (RENA-VIT) TABS tablet Take 1 tablet by mouth daily.     [provider]  mupirocin ointment (BACTROBAN) 2 % Apply 1 application topically daily as needed (leg rash).  11/09/19   [provider]  nitroGLYCERIN (NITROSTAT) 0.4 MG SL tablet Place 0.4 mg under the tongue every 5 (five) minutes as needed for chest pain.     [provider]  Nutritional Supplements (FEEDING SUPPLEMENT, NEPRO CARB STEADY,) LIQD Take 237 mLs by mouth 2 (two) times daily between meals. 12/17/20   Lorella Nimrod, MD  pantoprazole (PROTONIX) 40 MG tablet Take 40 mg by mouth daily. 08/28/19   [provider]  polyethylene glycol (MIRALAX / GLYCOLAX) 17 g packet Take 17 g by mouth daily as needed. Patient taking differently: Take 17 g by mouth daily as needed for mild constipation or moderate constipation. 04/02/21   Nolberto Hanlon, MD  Probiotic Product (PROBIOTIC PO) Take 1 capsule  by mouth daily.  [provider]  promethazine (PHENERGAN) 12.5 MG tablet Take 1 tablet (12.5 mg total) by mouth every 6 (six) hours as needed for nausea or vomiting. 05/10/21   Wouk, Ailene Rud, MD  sevelamer carbonate (RENVELA) 800 MG tablet Take 800 mg by mouth 4 (four) times daily. (Take with meals and snack)    [provider]  vitamin E 180 MG (400 UNITS) capsule Take 400 Units by mouth daily.    [provider]    Allergies Ace inhibitors, Ativan [lorazepam], Compazine [prochlorperazine edisylate], Sumatriptan succinate, Zofran [ondansetron], Losartan, Prochlorperazine, Reglan [metoclopramide], Scopolamine, Tape, Tapentadol, and Ultrasound gel  Family History  Problem Relation Age of Onset   Kidney disease Mother    Diabetes Mother    Cancer Father    Kidney disease Sister     Social History Social History   Tobacco Use   Smoking status: Never   Smokeless tobacco: Never  Vaping Use   Vaping Use: Never used  Substance Use Topics   Alcohol use: Not Currently    Comment: glass wine week per pt   Drug use: Yes    Types: Marijuana    Comment: once a day    Review of Systems  Constitutional: No fever/chills Eyes: No visual changes. ENT: No sore throat. Cardiovascular:Positive for chest pain. Respiratory: Positive for shortness of breath. Gastrointestinal: No abdominal pain.  No nausea, no vomiting.  No diarrhea.  No constipation. Genitourinary: Negative for dysuria. Musculoskeletal: Negative for back pain. Skin: Negative for rash. Neurological: Negative for headaches, focal weakness or numbness.  ____________________________________________   PHYSICAL EXAM:  VITAL SIGNS: Vitals:   06/04/21 1815 06/04/21 1830  BP:  (!) 157/67  Pulse: 63 66  Resp:  20  Temp:    SpO2: 95% 96%    Constitutional: Alert and oriented.  Appears quite uncomfortable on presentation, frequently crying out. This rapidly improves after single small dose  of morphine and she is comfortable and pleasant after this. Eyes: Conjunctivae are normal. PERRL. EOMI. Head: Atraumatic. Nose: No congestion/rhinnorhea. Mouth/Throat: Mucous membranes are moist.  Oropharynx non-erythematous. Neck: No stridor. No cervical spine tenderness to palpation. Cardiovascular: Normal rate, regular rhythm. Grossly normal heart sounds.  Good peripheral circulation. Respiratory: Normal respiratory effort.  No retractions. Lungs CTAB. Gastrointestinal: Soft , nondistended, nontender to palpation. No CVA tenderness. Musculoskeletal: No lower extremity tenderness.  No joint effusions. No signs of acute trauma. Pitting edema to bilateral lower extremities and chronic venous stasis dermatitis is present. Dialysis catheter to the right chest, clean insertion site. Neurologic:  Normal speech and language. No gross focal neurologic deficits are appreciated. No gait instability noted. Skin:  Skin is warm, dry and intact. No rash noted. Psychiatric: Mood and affect are normal. Speech and behavior are normal.  ____________________________________________   LABS (all labs ordered are listed, but only abnormal results are displayed)  Labs Reviewed  BASIC METABOLIC PANEL - Abnormal; Notable for the following components:      Result Value   Sodium 131 (*)    Chloride 95 (*)    Creatinine, Ser 3.26 (*)    Calcium 8.1 (*)    GFR, Estimated 15 (*)    All other components within normal limits  CBC - Abnormal; Notable for the following components:   RBC 3.36 (*)    Hemoglobin 9.6 (*)    HCT 29.9 (*)    RDW 18.4 (*)    Platelets 140 (*)    All other components within normal limits  HEPATIC FUNCTION  PANEL - Abnormal; Notable for the following components:   Albumin 2.6 (*)    All other components within normal limits  RESP PANEL BY RT-PCR (FLU A&B, COVID) ARPGX2  LIPASE, BLOOD  URINALYSIS, COMPLETE (UACMP) WITH MICROSCOPIC  TROPONIN I (HIGH SENSITIVITY)  TROPONIN I (HIGH  SENSITIVITY)   ____________________________________________  12 Lead EKG  Sinus rhythm with a rate of 69 bpm.  Normal axis and intervals.  Septal nonspecific ST changes without STEMI.  Similar to EKGs from 1 month ago ____________________________________________  RADIOLOGY  ED MD interpretation: 2 view CXR reviewed by me with evidence of pulmonary vascular congestion  Official radiology report(s): DG Chest 2 View  Result Date: 06/04/2021 CLINICAL DATA:  Chest pain beginning after dialysis. EXAM: CHEST - 2 VIEW COMPARISON:  04/26/2021 FINDINGS: Central line in place with tips in the right atrium. Left-sided vascular stent in place. There is cardiomegaly with interstitial and alveolar edema, small effusions and dependent pulmonary atelectasis. IMPRESSION: Congestive heart failure/fluid overload. Cardiomegaly, interstitial and alveolar edema, small effusions and basilar atelectasis. Electronically Signed   By: Nelson Chimes M.D.   On: 06/04/2021 15:55    ____________________________________________   PROCEDURES and INTERVENTIONS  Procedure(s) performed (including Critical Care):  Ultrasound ED Peripheral IV (Provider)  Date/Time: 06/04/2021 5:58 PM Performed by: Vladimir Crofts, MD Authorized by: Vladimir Crofts, MD   Procedure details:    Indications: multiple failed IV attempts and poor IV access     Skin Prep: chlorhexidine gluconate     Location: Left EJ.   Angiocath:  20 G   Bedside Ultrasound Guided: No     Images: not archived     Patient tolerated procedure without complications: Yes     Dressing applied: Yes   .1-3 Lead EKG Interpretation Performed by: Vladimir Crofts, MD Authorized by: Vladimir Crofts, MD     Interpretation: normal     ECG rate:  70   ECG rate assessment: normal     Rhythm: sinus rhythm     Ectopy: none     Conduction: normal    Medications  acetaminophen (TYLENOL) tablet 1,000 mg (1,000 mg Oral Patient Refused/Not Given 06/04/21 1726)  nitroGLYCERIN  (NITROSTAT) SL tablet 0.4 mg (0 mg Sublingual Hold 06/04/21 1800)  furosemide (LASIX) injection 80 mg (80 mg Intravenous Given 06/04/21 1726)  morphine 4 MG/ML injection 4 mg (4 mg Intravenous Given 06/04/21 1727)    ____________________________________________   MDM / ED COURSE   Dialysis patient presents to the ED with atypical chest pains.  She presents quite uncomfortable-appearing, but clinical picture drastically improves after single dose of morphine.  EKG is nonischemic with nonspecific septal ST changes, similar to EKGs in the past.  First troponin is negative and blood work is generally reassuring with chronic anemia and stigmata of ESRD.  CXR with evidence of volume overload, and she peripherally does have edema, but after he gets she gets her morphine, she is laying flat without tachypnea or complaints.  I see no evidence of emergent need for dialysis, though anticipate she would benefit from getting dialyzed and having fluid removed.  Awaiting second troponin to help guide disposition.  If this is negative, anticipate she be suitable to go home this evening and get dialyzed in the morning, and would not require hospitalization for this.  Clinical Course as of 06/04/21 1927  Tue Jun 04, 2021  1648 Called son [name]. He called 911 bc pains in her chest. Started yesterday. Sharp pains in her chest. On Antibiotics now for  PNA, home from rehab 9 days ago.  [DS]  7125 doxycycline [DS]  2712 Left sided EJ placed by me [DS]  9290 Reassessed. Chest pain relieved, comfortable [DS]  1925 Reassessed.  Continues to feel well and requesting a blanket.  Laying nearly flat and is not tachypneic. [DS]    Clinical Course User Index [DS] Vladimir Crofts, MD    ____________________________________________   FINAL CLINICAL IMPRESSION(S) / ED DIAGNOSES  Final diagnoses:  Other chest pain  ESRD (end stage renal disease) on dialysis [facility])     ED Discharge Orders     None        Marguarite Markov   Note:  This document was prepared using Dragon voice recognition software and may include unintentional dictation errors.    Vladimir Crofts, MD 06/04/21 1929

## 2021-06-04 NOTE — Discharge Instructions (Addendum)
Please make sure that you make it to dialysis tomorrow morning.  Is important that you get dialyzed and have fluid taken off.  Return to the ED with any significantly worsening symptoms.

## 2021-06-04 NOTE — ED Triage Notes (Signed)
MIDDLE UPPER CHEST PAIN , ASA TAKEN TODAY, CP STARTED AFTER DIALYSIS

## 2021-06-04 NOTE — ED Notes (Signed)
Spoke with son via phone. Son will be here at 0000 to receive pt from ED.

## 2021-06-04 NOTE — ED Notes (Signed)
Pt report not producing urine, but every few days. Pt at this time refuses in/out cath. Bladder scan shows 57mls, after three scans

## 2021-06-04 NOTE — ED Provider Notes (Signed)
-----------------------------------------   9:15 PM on 06/04/2021 -----------------------------------------  Blood pressure (!) 159/65, pulse 65, temperature 98.7 F (37.1 C), temperature source Oral, resp. rate 16, height 5\' 5"  (1.651 m), weight 71.2 kg, SpO2 97 %.  Assuming care from Dr. Tamala Julian.  In short, [name] is a 61 y.o. female with a chief complaint of Chest Pain ([place] ) .  Refer to the original H&P for additional details.  The current plan of care is to await second troponin.  Patient presented to the emergency department complaining of chest pain that began after her dialysis yesterday.  Patient complaining of progressive worsening throughout today.  On arrival patient was complaining of sharp chest pain and was seen by the previous provider.  She was given morphine on arrival and since then has been resting comfortably with no pain.  I received handoff from Dr. Tamala Julian awaiting discharge.  Current plan was to await second troponin and if this had not changed that she would be stable for discharge.  Remainder of work-up was reassuring.  Second troponin remains at 5 and at this time patient is stable for discharge.  ED diagnosis:  Other chest pain End-stage renal disease on dialysis    Brynda Peon 06/04/21 2119    Vladimir Crofts, MD 06/06/21 [phone]

## 2021-06-06 ENCOUNTER — Other Ambulatory Visit (INDEPENDENT_AMBULATORY_CARE_PROVIDER_SITE_OTHER): Payer: Self-pay | Admitting: Nurse Practitioner

## 2021-06-12 ENCOUNTER — Telehealth (INDEPENDENT_AMBULATORY_CARE_PROVIDER_SITE_OTHER): Payer: Self-pay | Admitting: Vascular Surgery

## 2021-06-12 NOTE — Telephone Encounter (Signed)
Documentation only.

## 2021-06-13 ENCOUNTER — Ambulatory Visit (INDEPENDENT_AMBULATORY_CARE_PROVIDER_SITE_OTHER): Payer: Medicare Other | Admitting: Vascular Surgery

## 2021-06-13 ENCOUNTER — Encounter (INDEPENDENT_AMBULATORY_CARE_PROVIDER_SITE_OTHER): Payer: Self-pay | Admitting: Vascular Surgery

## 2021-06-13 ENCOUNTER — Other Ambulatory Visit: Payer: Self-pay

## 2021-06-13 VITALS — BP 144/72 | HR 64 | Resp 15

## 2021-06-13 DIAGNOSIS — N186 End stage renal disease: Secondary | ICD-10-CM

## 2021-06-13 DIAGNOSIS — E785 Hyperlipidemia, unspecified: Secondary | ICD-10-CM | POA: Diagnosis not present

## 2021-06-13 DIAGNOSIS — K219 Gastro-esophageal reflux disease without esophagitis: Secondary | ICD-10-CM

## 2021-06-13 DIAGNOSIS — E1159 Type 2 diabetes mellitus with other circulatory complications: Secondary | ICD-10-CM | POA: Diagnosis not present

## 2021-06-13 DIAGNOSIS — I2510 Atherosclerotic heart disease of native coronary artery without angina pectoris: Secondary | ICD-10-CM

## 2021-06-13 DIAGNOSIS — Z992 Dependence on renal dialysis: Secondary | ICD-10-CM

## 2021-06-15 ENCOUNTER — Encounter (INDEPENDENT_AMBULATORY_CARE_PROVIDER_SITE_OTHER): Payer: Self-pay | Admitting: Vascular Surgery

## 2021-06-15 NOTE — Progress Notes (Signed)
MRN : [id number]  [name] is a 60 y.o. (1960/10/20) female who presents with chief complaint of stitch hurts.  History of Present Illness:    The patient is seen for evaluation of dialysis access.  The patient has a history of multiple failed accesses.  There have been accesses in both arms and in the thighs.    Current access is via a catheter which is functioning adequately.  She is concerned because the silk stitch is tugging and bothers her.  The patient denies amaurosis fugax or recent TIA symptoms. There are no recent neurological changes noted. The patient denies claudication symptoms or rest pain symptoms. The patient denies history of DVT, PE or superficial thrombophlebitis. The patient denies recent episodes of angina or shortness of breath.    Current Meds  Medication Sig   albuterol (PROVENTIL) (2.5 MG/3ML) 0.083% nebulizer solution Take 2.5 mg by nebulization every 4 (four) hours as needed for wheezing or shortness of breath.   albuterol (VENTOLIN HFA) 108 (90 Base) MCG/ACT inhaler Inhale 2 puffs into the lungs every 4 (four) hours as needed for wheezing or shortness of breath.   alum & mag hydroxide-simeth (MAALOX/MYLANTA) 200-200-20 MG/5ML suspension Take 15 mLs by mouth every 6 (six) hours as needed for indigestion or heartburn.   amLODipine (NORVASC) 10 MG tablet Take 1 tablet (10 mg total) by mouth daily.   ammonium lactate (LAC-HYDRIN) 12 % lotion Apply 1 application topically 2 (two) times daily as needed for dry skin.    aspirin EC 81 MG tablet Take 81 mg by mouth daily.   atorvastatin (LIPITOR) 20 MG tablet Take 20 mg by mouth every evening.    Biotin 1 MG CAPS Take 1 mg by mouth daily.   calcium carbonate (TUMS - DOSED IN MG ELEMENTAL CALCIUM) 500 MG chewable tablet Chew 2.5 tablets (500 mg of elemental calcium total) by mouth every 6 (six) hours as needed for indigestion.   Cholecalciferol (VITAMIN D3) 25 MCG (1000 UT) CAPS Take 1,000 Units by mouth daily.     cyanocobalamin 1000 MCG tablet Take 1,000 mcg by mouth daily.    diphenhydrAMINE (BENADRYL) 25 MG tablet Take 25 mg by mouth every 6 (six) hours as needed for itching.   epoetin alfa (EPOGEN) 10000 UNIT/ML injection Inject 1 mL (10,000 Units total) into the vein every Monday, Wednesday, and Friday with hemodialysis.   Ferrous Sulfate (IRON) 325 (65 Fe) MG TABS Take 325 mg by mouth daily.    gabapentin (NEURONTIN) 100 MG capsule Take 100 mg by mouth at bedtime.   HYDROcodone-acetaminophen (NORCO/VICODIN) 5-325 MG tablet Take 1 tablet by mouth 3 (three) times daily as needed for moderate pain or severe pain.   hydrOXYzine (ATARAX/VISTARIL) 25 MG tablet Take 25 mg by mouth daily as needed for itching.   lidocaine (LIDODERM) 5 % Place 1 patch onto the skin daily. (Remove after 12 hours -- leave off for 12 hours before applying new patch)   meclizine (ANTIVERT) 25 MG tablet Take 25 mg by mouth every 6 (six) hours as needed for dizziness.   mirtazapine (REMERON) 7.5 MG tablet Take 7.5 mg by mouth daily.   multivitamin (RENA-VIT) TABS tablet Take 1 tablet by mouth daily.    mupirocin ointment (BACTROBAN) 2 % Apply 1 application topically daily as needed (leg rash).    nitroGLYCERIN (NITROSTAT) 0.4 MG SL tablet Place 0.4 mg under the tongue every 5 (five) minutes as needed for chest pain.    Nutritional Supplements (FEEDING  SUPPLEMENT, NEPRO CARB STEADY,) LIQD Take 237 mLs by mouth 2 (two) times daily between meals.   pantoprazole (PROTONIX) 40 MG tablet Take 40 mg by mouth daily.   polyethylene glycol (MIRALAX / GLYCOLAX) 17 g packet Take 17 g by mouth daily as needed. (Patient taking differently: Take 17 g by mouth daily as needed for mild constipation or moderate constipation.)   Probiotic Product (PROBIOTIC PO) Take 1 capsule by mouth daily.   promethazine (PHENERGAN) 12.5 MG tablet Take 1 tablet (12.5 mg total) by mouth every 6 (six) hours as needed for nausea or vomiting.   sevelamer carbonate  (RENVELA) 800 MG tablet Take 800 mg by mouth 4 (four) times daily. (Take with meals and snack)   vitamin E 180 MG (400 UNITS) capsule Take 400 Units by mouth daily.    Past Medical History:  Diagnosis Date   Anemia    Anginal pain (HCC)    Anxiety    Arthritis    Asthma    Broken wrist    Bronchitis    chronic diastolic CHF 0/93/2671   Chronic kidney disease    COPD (chronic obstructive pulmonary disease) ([place])    Coronary artery disease    a. cath 2013: stenting to RCA (report not available); b. cath 2014: LM nl, pLAD 40%, mLAD nl, ost LCx 40%, mid LCx nl, pRCA 30% @ site of prior stent, mRCA 50%   Depression    Diabetes mellitus ([place])    Diabetes mellitus without complication ([place])    Diabetic neuropathy ([place])    dialysis 2006   Diverticulosis    Dizziness    Dyspnea    Elevated lipids    Environmental and seasonal allergies    ESRD (end stage renal disease) on dialysis ([place])    M-W-F   Gastroparesis    GERD (gastroesophageal reflux disease)    Headache    History of anemia due to chronic kidney disease    History of hiatal hernia    HOH (hard of hearing)    Hx of pancreatitis 2015   Hypertension    Lower extremity edema    Mitral regurgitation    a. echo 10/2013: EF 62%, noWMA, mildly dilated LA, mild to mod MR/TR, GR1DD   Myocardial infarction ([place])    Orthopnea    Parathyroid abnormality ([place])    Peripheral arterial disease ([place])    Pneumonia    Renal cancer ([place])    Renal insufficiency    Pt is on dialysis on M,W + F.   Wheezing     Past Surgical History:  Procedure Laterality Date   A/V SHUNTOGRAM Left 01/20/2018   Procedure: A/V SHUNTOGRAM;  Surgeon: Algernon Huxley, MD;  Location: [place] [facility];  Service: Cardiovascular;  Laterality: Left;   ABDOMINAL HYSTERECTOMY  1992   AMPUTATION TOE Left 10/02/2017   Procedure: AMPUTATION TOE-LEFT GREAT TOE;  Surgeon: Albertine Patricia, DPM;  Location: [facility];  Service: Podiatry;  Laterality: Left;    APPENDECTOMY     APPLICATION OF WOUND VAC N/A 11/25/2019   Procedure: APPLICATION OF WOUND VAC;  Surgeon: Katha Cabal, MD;  Location: [facility];  Service: Vascular;  Laterality: N/A;   ARTERY BIOPSY Right 10/11/2018   Procedure: BIOPSY TEMPORAL ARTERY;  Surgeon: Vickie Epley, MD;  Location: [facility];  Service: General;  Laterality: Right;   CARDIAC CATHETERIZATION Left 07/26/2015   Procedure: Left Heart Cath and Coronary Angiography;  Surgeon: Dionisio David, MD;  Location: [place] [facility];  Service: Cardiovascular;  Laterality: Left;   CATARACT EXTRACTION W/ INTRAOCULAR LENS IMPLANT Right    CATARACT EXTRACTION W/PHACO Left 03/10/2017   Procedure: CATARACT EXTRACTION PHACO AND INTRAOCULAR LENS PLACEMENT ([place]);  Surgeon: Birder Robson, MD;  Location: [facility];  Service: Ophthalmology;  Laterality: Left;  [place] 00:51.9 AP% 14.2 CDE 7.39 fluid pack lot # [id number]   CENTRAL LINE INSERTION Right 11/11/2019   Procedure: CENTRAL LINE INSERTION;  Surgeon: Katha Cabal, MD;  Location: [facility];  Service: Vascular;  Laterality: Right;   CENTRAL LINE INSERTION  11/25/2019   Procedure: CENTRAL LINE INSERTION;  Surgeon: Katha Cabal, MD;  Location: [facility];  Service: Vascular;;   CHOLECYSTECTOMY     COLONOSCOPY WITH PROPOFOL N/A 08/12/2016   Procedure: COLONOSCOPY WITH PROPOFOL;  Surgeon: Lollie Sails, MD;  Location: [facility] ENDOSCOPY;  Service: Endoscopy;  Laterality: N/A;   DIALYSIS FISTULA CREATION Left    upper arm   dialysis grafts     DIALYSIS/PERMA CATHETER INSERTION N/A 11/14/2019   Procedure: DIALYSIS/PERMA CATHETER INSERTION;  Surgeon: Algernon Huxley, MD;  Location: [place] [facility];  Service: Cardiovascular;  Laterality: N/A;   DIALYSIS/PERMA CATHETER INSERTION N/A 02/03/2020   Procedure: DIALYSIS/PERMA CATHETER INSERTION;  Surgeon: Katha Cabal, MD;  Location: [place] [facility];  Service: Cardiovascular;  Laterality: N/A;   DIALYSIS/PERMA CATHETER INSERTION  N/A 06/19/2020   Procedure: DIALYSIS/PERMA CATHETER INSERTION;  Surgeon: Katha Cabal, MD;  Location: [place] [facility];  Service: Cardiovascular;  Laterality: N/A;   DIALYSIS/PERMA CATHETER INSERTION N/A 05/07/2021   Procedure: DIALYSIS/PERMA CATHETER INSERTION;  Surgeon: Katha Cabal, MD;  Location: [place] [facility];  Service: Cardiovascular;  Laterality: N/A;  Patient needs general anethesia   DIALYSIS/PERMA CATHETER REMOVAL N/A 05/25/2020   Procedure: DIALYSIS/PERMA CATHETER REMOVAL;  Surgeon: Katha Cabal, MD;  Location: [place] [facility];  Service: Cardiovascular;  Laterality: N/A;   ESOPHAGOGASTRODUODENOSCOPY N/A 03/08/2015   Procedure: ESOPHAGOGASTRODUODENOSCOPY (EGD);  Surgeon: Manya Silvas, MD;  Location: [facility] ENDOSCOPY;  Service: Endoscopy;  Laterality: N/A;   ESOPHAGOGASTRODUODENOSCOPY (EGD) WITH PROPOFOL N/A 03/18/2016   Procedure: ESOPHAGOGASTRODUODENOSCOPY (EGD) WITH PROPOFOL;  Surgeon: [name], MD;  Location: [facility] ENDOSCOPY;  Service: Endoscopy;  Laterality: N/A;   EYE SURGERY Right 2018   FECAL TRANSPLANT N/A 08/23/2015   Procedure: FECAL TRANSPLANT;  Surgeon: Manya Silvas, MD;  Location[facility] END[facility]COPY;  Service: Endoscopy;  Laterality: N/A;   HAND SURGERY Bilateral    HEMATOMA EVACUATION Left 11/25/2019   Procedure: EVACUATION HEMATOMA;  Surgeon: Katha Cabal, MD;  Location: [facility];  Service: Vascular;  Laterality: Left;   I & D EXTREMITY Left 11/25/2019   Procedure: IRRIGATION AND DEBRIDEMENT EXTREMITY;  Surgeon: Katha Cabal, MD;  Location: [facility];  Service: Vascular;  Laterality: Left;   IR FLUORO GUIDE CV LINE RIGHT  04/06/2020   IR INJECT/THERA/INC NEEDLE/CATH/PLC EPI/CERV/THOR W/IMG  08/13/2020   IR RADIOLOGIST EVAL & MGMT  07/28/2019   IR RADIOLOGIST EVAL & MGMT  08/11/2019   LIGATION OF ARTERIOVENOUS  FISTULA Left 11/11/2019   LIGATION OF ARTERIOVENOUS  FISTULA Left 11/11/2019   Procedure: LIGATION OF ARTERIOVENOUS  FISTULA;   Surgeon: Katha Cabal, MD;  Location: [facility];  Service: Vascular;  Laterality: Left;   LIGATIONS OF HERO GRAFT Right 06/13/2020   Procedure: LIGATION / REMOVAL OF RIGHT HERO GRAFT;  Surgeon: Katha Cabal, MD;  Location: [facility];  Service: Vascular;  Laterality: Right;   PERIPHERAL VASCULAR  CATHETERIZATION N/A 12/20/2015   Procedure: Thrombectomy of dialysis access versus permcath placement;  Surgeon: Algernon Huxley, MD;  Location: [place] [facility];  Service: Cardiovascular;  Laterality: N/A;   PERIPHERAL VASCULAR CATHETERIZATION N/A 12/20/2015   Procedure: A/V Shunt Intervention;  Surgeon: Algernon Huxley, MD;  Location: [place] [facility];  Service: Cardiovascular;  Laterality: N/A;   PERIPHERAL VASCULAR CATHETERIZATION N/A 12/20/2015   Procedure: A/V Shuntogram/Fistulagram;  Surgeon: Algernon Huxley, MD;  Location: [place] [facility];  Service: Cardiovascular;  Laterality: N/A;   PERIPHERAL VASCULAR CATHETERIZATION N/A 01/02/2016   Procedure: A/V Shuntogram/Fistulagram;  Surgeon: Algernon Huxley, MD;  Location: [place] [facility];  Service: Cardiovascular;  Laterality: N/A;   PERIPHERAL VASCULAR CATHETERIZATION N/A 01/02/2016   Procedure: A/V Shunt Intervention;  Surgeon: Algernon Huxley, MD;  Location: [place] [facility];  Service: Cardiovascular;  Laterality: N/A;   TEE WITHOUT CARDIOVERSION N/A 06/11/2020   Procedure: TRANSESOPHAGEAL ECHOCARDIOGRAM (TEE);  Surgeon: Teodoro Spray, MD;  Location: [facility];  Service: Cardiovascular;  Laterality: N/A;   UPPER EXTREMITY ANGIOGRAPHY Bilateral 11/27/2020   Procedure: UPPER EXTREMITY ANGIOGRAPHY;  Surgeon: Katha Cabal, MD;  Location: [place] [facility];  Service: Cardiovascular;  Laterality: Bilateral;   UPPER EXTREMITY VENOGRAPHY Right 01/18/2020   Procedure: UPPER EXTREMITY VENOGRAPHY;  Surgeon: Katha Cabal, MD;  Location: [place] [facility];  Service: Cardiovascular;  Laterality: Right;   UPPER EXTREMITY VENOGRAPHY Bilateral  11/27/2020   Procedure: UPPER EXTREMITY VENOGRAPHY;  Surgeon: Katha Cabal, MD;  Location: [place] [facility];  Service: Cardiovascular;  Laterality: Bilateral;   VASCULAR ACCESS DEVICE INSERTION Right 04/13/2020   Procedure: INSERTION OF HERO VASCULAR ACCESS DEVICE (GRAFT);  Surgeon: Katha Cabal, MD;  Location: [facility];  Service: Vascular;  Laterality: Right;    Social History Social History   Tobacco Use   Smoking status: Never   Smokeless tobacco: Never  Vaping Use   Vaping Use: Never used  Substance Use Topics   Alcohol use: Not Currently    Comment: glass wine week per pt   Drug use: Yes    Types: Marijuana    Comment: once a day    Family History Family History  Problem Relation Age of Onset   Kidney disease Mother    Diabetes Mother    Cancer Father    Kidney disease Sister     Allergies  Allergen Reactions   Ace Inhibitors Swelling and Anaphylaxis   Ativan [Lorazepam] Other (See Comments)    Reaction:  Hallucinations and headaches   Compazine [Prochlorperazine Edisylate] Anaphylaxis, Nausea And Vomiting and Other (See Comments)    Other reaction(s): dystonia from this vs. Reglan, 23 Jul - patient relates that she takes promethazine frequently with no problems   Sumatriptan Succinate Other (See Comments)    Other reaction(s): delirium and hallucinations per [facility] records   Zofran [Ondansetron] Nausea And Vomiting    Per pt. she is allergic to zofran or will experience adverse reaction like hallucination    Losartan Nausea Only   Prochlorperazine Other (See Comments)    Reaction:  Unknown . Patient does not remember reaction but she does have vertigo and anxiety along with n and v at times. Could be used to treat any of these    Reglan [Metoclopramide] Other (See Comments)    Per patient her Dr. Evelina Bucy her off it    Scopolamine Other (See Comments)    Dizziness, also has vertigo already  Tape Rash    Plastic tape causes rash   Tapentadol Rash    Ultrasound Gel Itching    Patient states the Ultrasound gel caused itching while in skin contact and resolved when wiped away.     REVIEW OF SYSTEMS (Negative unless checked)  Constitutional: [] Weight loss  [] Fever  [] Chills Cardiac: [] Chest pain   [] Chest pressure   [] Palpitations   [] Shortness of breath when laying flat   [] Shortness of breath with exertion. Vascular:  [] Pain in legs with walking   [] Pain in legs at rest  [] History of DVT   [] Phlebitis   [] Swelling in legs   [] Varicose veins   [] Non-healing ulcers Pulmonary:   [] Uses home oxygen   [] Productive cough   [] Hemoptysis   [] Wheeze  [] COPD   [] Asthma Neurologic:  [] Dizziness   [] Seizures   [] History of stroke   [] History of TIA  [] Aphasia   [] Vissual changes   [] Weakness or numbness in arm   [] Weakness or numbness in leg Musculoskeletal:   [] Joint swelling   [] Joint pain   [] Low back pain Hematologic:  [] Easy bruising  [] Easy bleeding   [] Hypercoagulable state   [] Anemic Gastrointestinal:  [] Diarrhea   [] Vomiting  [x] Gastroesophageal reflux/heartburn   [] Difficulty swallowing. Genitourinary:  [x] Chronic kidney disease   [] Difficult urination  [] Frequent urination   [] Blood in urine Skin:  [] Rashes   [] Ulcers  Psychological:  [] History of anxiety   []  History of major depression.  Physical Examination  Vitals:   06/13/21 1348  BP: (!) 144/72  Pulse: 64  Resp: 15   There is no height or weight on file to calculate BMI. Gen: WD/WN, NAD Head: [place]/AT, No temporalis wasting.  Ear/Nose/Throat: Hearing grossly intact, nares w/o erythema or drainage Eyes: PER, EOMI, sclera nonicteric.  Neck: Supple, no gross masses or lesions.  No JVD.  Pulmonary:  Good air movement, no audible wheezing, no use of accessory muscles.  Cardiac: RRR, precordium non-hyperdynamic. Vascular:    right sided catheter is CD&I the silk stitch is in place and not infected Vessel Right Left  Radial Not Palpable Not Palpable  Gastrointestinal: soft,  non-distended. No guarding/no peritoneal signs.  Musculoskeletal: M/S 5/5 throughout.  No deformity.  Neurologic: CN 2-12 intact. Pain and light touch intact in extremities.  Symmetrical.  Speech is fluent. Motor exam as listed above. Psychiatric: Judgment intact, Mood & affect appropriate for pt's clinical situation. Dermatologic: No rashes or ulcers noted.  No changes consistent with cellulitis.   CBC Lab Results  Component Value Date   WBC 5.2 06/04/2021   HGB 9.6 (L) 06/04/2021   HCT 29.9 (L) 06/04/2021   MCV 89.0 06/04/2021   PLT 140 (L) 06/04/2021    BMET    Component Value Date/Time   NA 131 (L) 06/04/2021 1712   NA 135 (L) 09/15/2014 0948   K 3.8 06/04/2021 1712   K 4.8 09/15/2014 0948   CL 95 (L) 06/04/2021 1712   CL 99 09/15/2014 0948   CO2 26 06/04/2021 1712   CO2 26 09/15/2014 0948   GLUCOSE 85 06/04/2021 1712   GLUCOSE 118 (H) 09/15/2014 0948   BUN 12 06/04/2021 1712   BUN 19 (H) 09/15/2014 0948   CREATININE 3.26 (H) 06/04/2021 1712   CREATININE 6.79 (H) 09/15/2014 0948   CALCIUM 8.1 (L) 06/04/2021 1712   CALCIUM 8.3 (L) 09/15/2014 0948   GFRNONAA 15 (L) 06/04/2021 1712   GFRNONAA 7 (L) 09/15/2014 0948   GFRNONAA 6 (L) 05/31/2014 0932   GFRAA  11 (L) 06/25/2020 0641   GFRAA 8 (L) 09/15/2014 0948   GFRAA 7 (L) 05/31/2014 0432   Estimated Creatinine Clearance: 17.7 mL/min (A) (by C-G formula based on SCr of 3.26 mg/dL (H)).  COAG Lab Results  Component Value Date   INR 1.2 04/25/2021   INR 1.3 (H) 03/29/2021   INR 1.3 (H) 03/17/2021    Radiology DG Chest 2 View  Result Date: 06/04/2021 CLINICAL DATA:  Chest pain beginning after dialysis. EXAM: CHEST - 2 VIEW COMPARISON:  04/26/2021 FINDINGS: Central line in place with tips in the right atrium. Left-sided vascular stent in place. There is cardiomegaly with interstitial and alveolar edema, small effusions and dependent pulmonary atelectasis. IMPRESSION: Congestive heart failure/fluid overload.  Cardiomegaly, interstitial and alveolar edema, small effusions and basilar atelectasis. Electronically Signed   By: Nelson Chimes M.D.   On: 06/04/2021 15:55     Assessment/Plan 1. ESRD on hemodialysis [facility]) I have discussed the situation with the patient .  Removing the stitch is likely to allow the catheter to work it's way out and she has very limited access options.  She has been deemed a nonoperative candidate secondary to her profound heart disease (as well as the problem of severe hypotension causing immediate failure and limited access sites).  We have decided to keep the stitch in place  2. Hyperlipidemia, unspecified hyperlipidemia type Continue statin as ordered and reviewed, no changes at this time   3. Type 2 diabetes mellitus with other circulatory complication, unspecified whether long term insulin use (HCC) Continue hypoglycemic medications as already ordered, these medications have been reviewed and there are no changes at this time.  Hgb A1C to be monitored as already arranged by primary service   4. Gastroesophageal reflux disease without esophagitis Continue PPI as already ordered, this medication has been reviewed and there are no changes at this time.  Avoidence of caffeine and alcohol  Moderate elevation of the head of the bed    5. Coronary artery disease involving native coronary artery of native heart, unspecified whether angina present She has been evaluated by cardiology and felt to be too high risk for general surgery.  Continue cardiac and antihypertensive medications as already ordered and reviewed, no changes at this time.  Continue statin as ordered and reviewed, no changes at this time  Nitrates PRN for chest pain     Hortencia Pilar, MD  06/15/2021 2:49 PM

## 2021-07-16 ENCOUNTER — Inpatient Hospital Stay: Payer: Medicare Other | Admitting: Nurse Practitioner

## 2021-07-23 IMAGING — US US EXTREM LOW VENOUS
1 series · 14 of 24 positions shown · non-contrast
Comparison: None.

CLINICAL DATA: Bilateral leg pain and swelling.

EXAM:
BILATERAL LOWER EXTREMITY VENOUS DOPPLER ULTRASOUND
TECHNIQUE: Gray-scale sonography with compression, as well as color and duplex
ultrasound, were performed to evaluate the deep venous system(s)
from the level of the common femoral vein through the popliteal and
proximal calf veins.

[Series 1: us venous img lower bilat (dvt) · portal-venous · 14 of 57 slices shown]
[im 1/57]
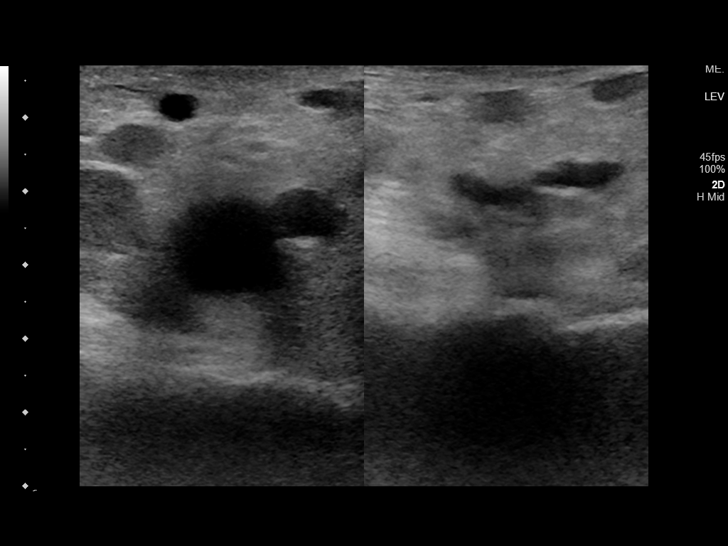
[im 5/57]
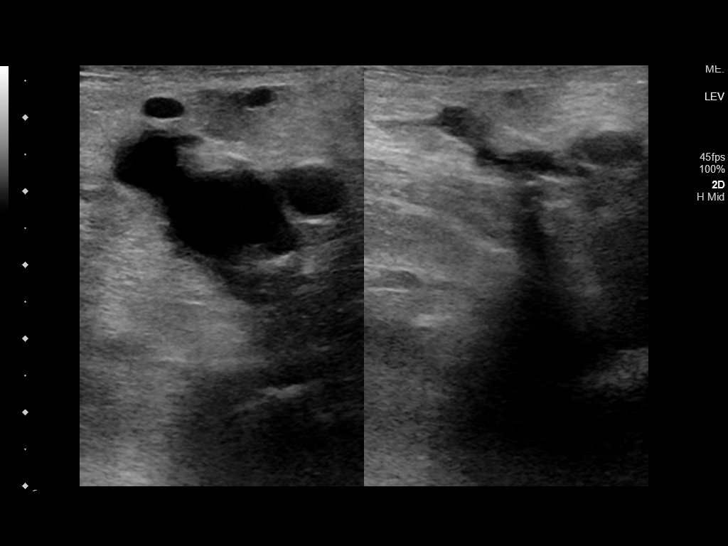
[im 10/57]
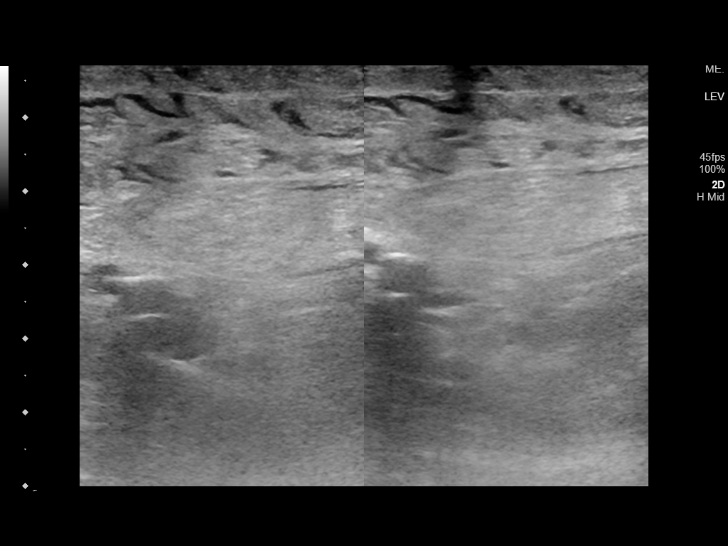
[im 15/57]
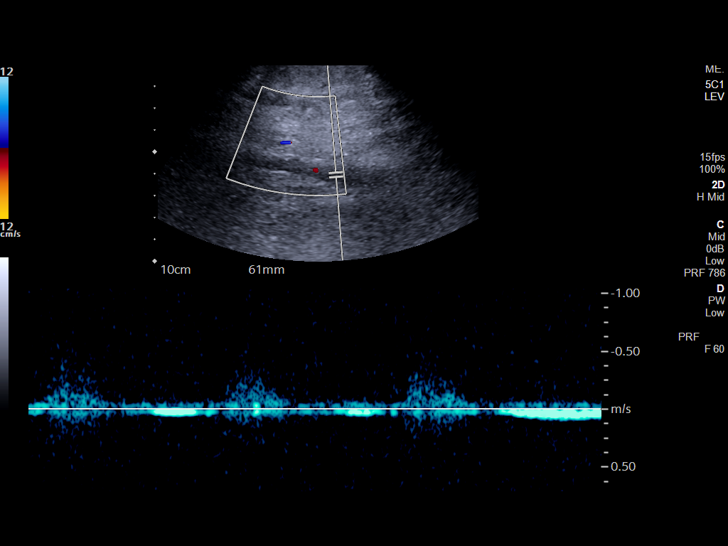
[im 18/57]
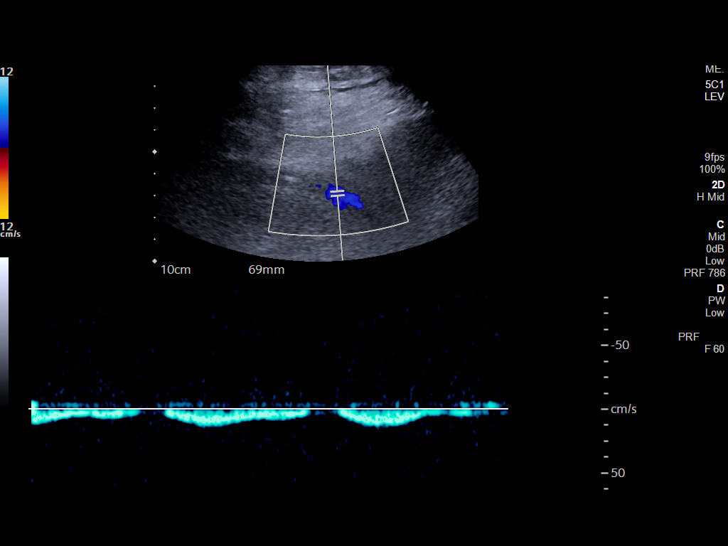
[im 22/57]
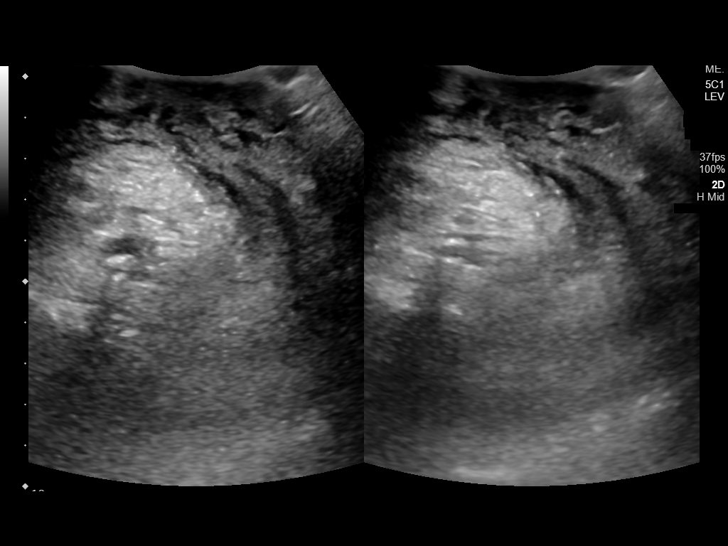
[im 27/57]
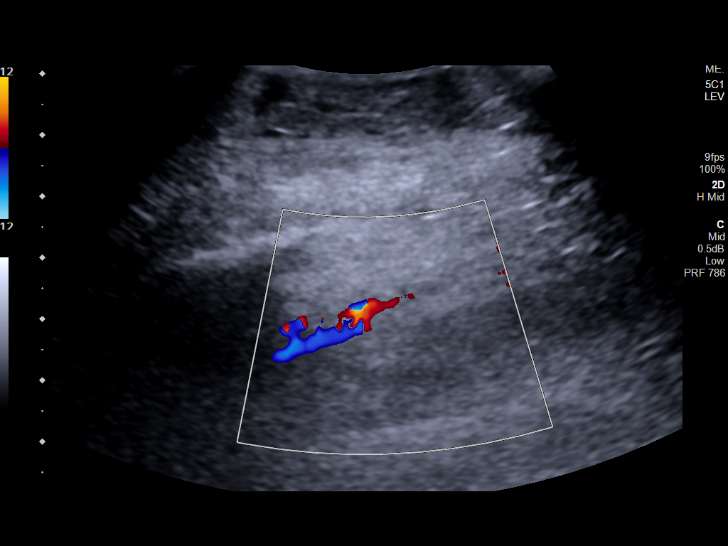
[im 30/57]
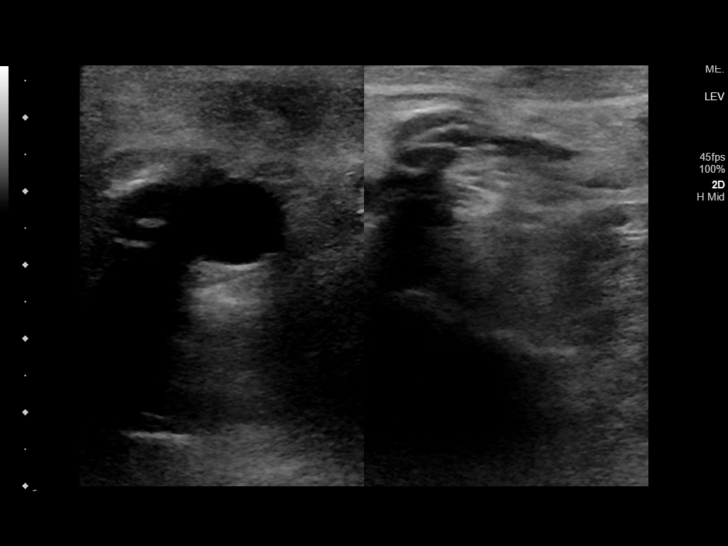
[im 35/57]
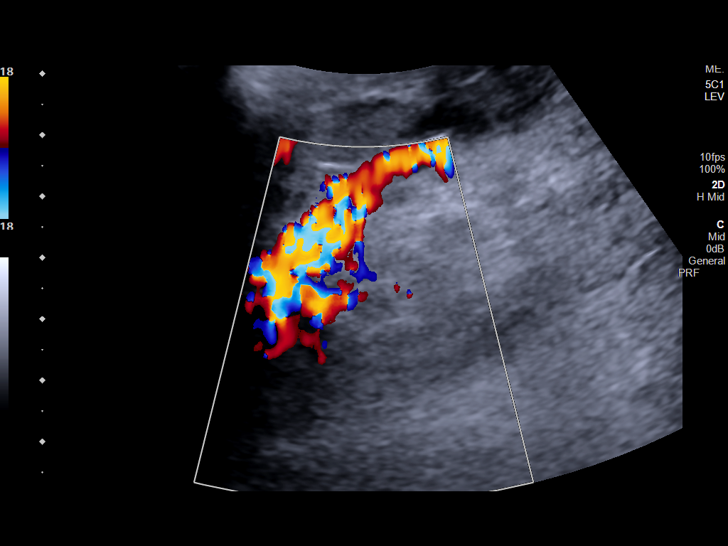
[im 39/57]
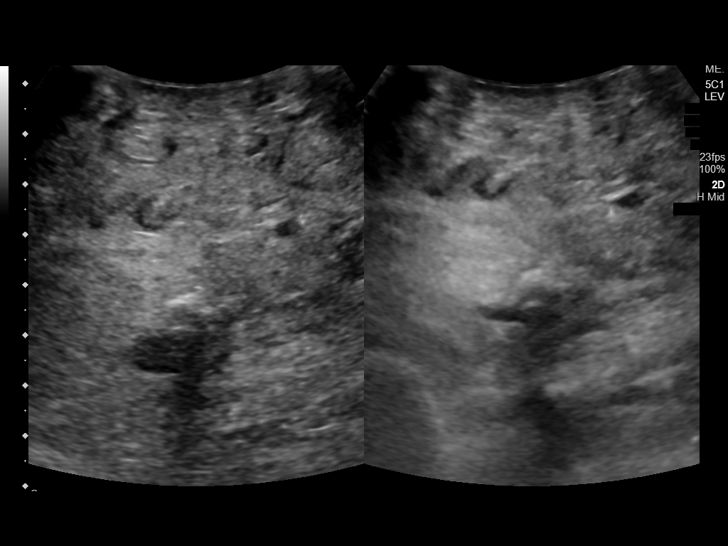
[im 44/57]
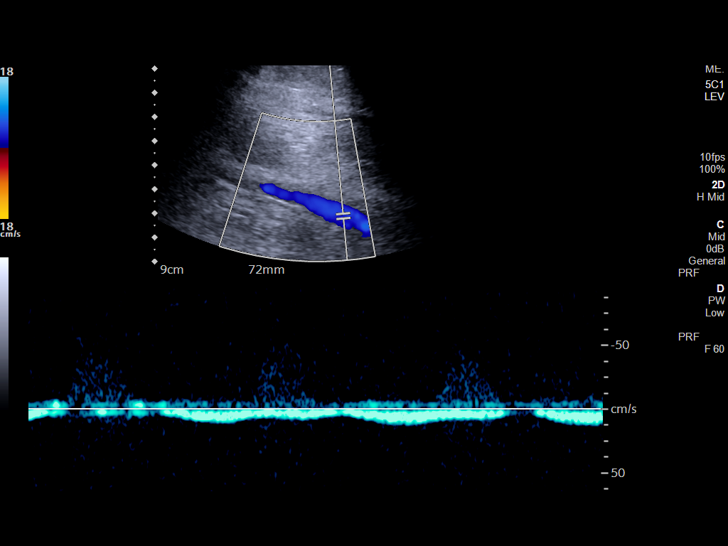
[im 47/57]
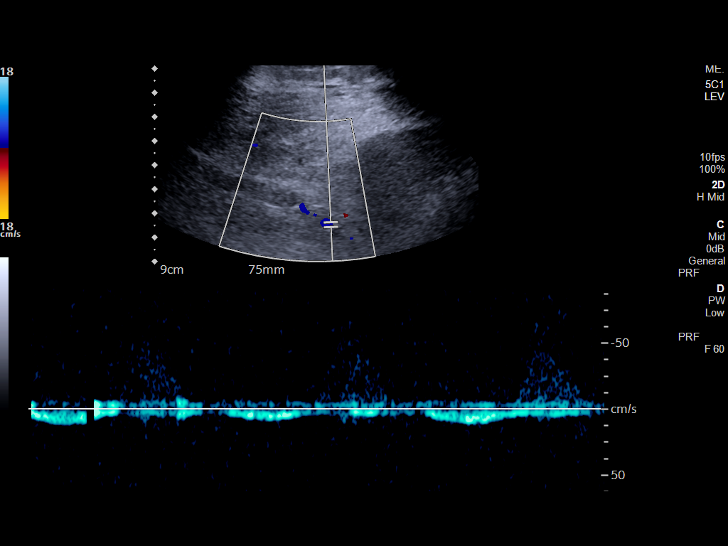
[im 52/57]
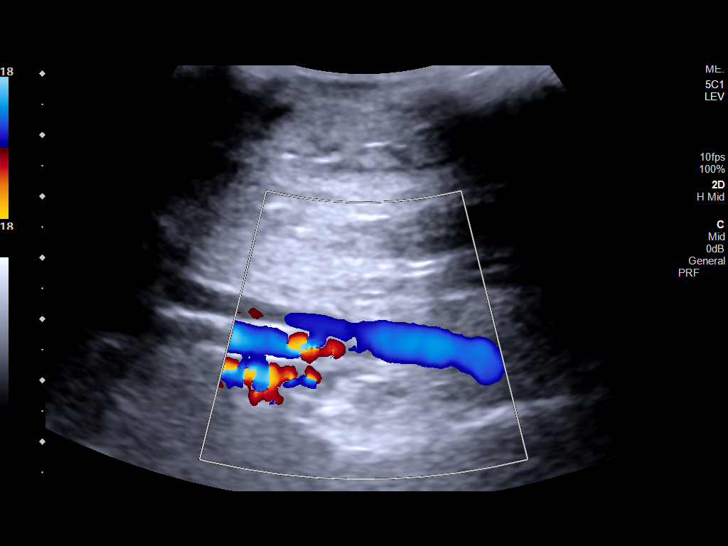
[im 57/57]
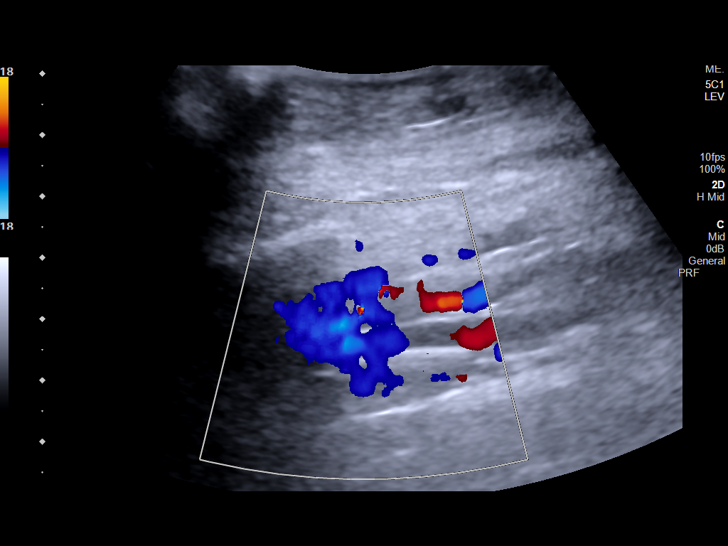

[14 of 24 positions shown; findings below may reference images not displayed]

FINDINGS: VENOUS

Normal compressibility of the BILATERAL common femoral, superficial
femoral, and popliteal veins, as well as the visualized BILATERAL
calf veins. Visualized portions of the BILATERAL profunda femoral
veins and BILATERAL great saphenous veins are unremarkable. No
filling defects to suggest DVT on grayscale or color Doppler
imaging. Doppler waveforms show normal direction of venous flow and
normal respiratory plasticity.

OTHER

Marked severity diffuse bilateral lower extremity edema is seen.

Limitations: It should be noted that the patient refused
augmentation secondary to pain (as per the ultrasound technologist).
IMPRESSION: 1. Limited study, as described above, without evidence of DVT within
the RIGHT or LEFT lower extremity.
2. Diffuse bilateral lower extremity edema.

## 2021-07-23 IMAGING — DX DG CHEST 1V PORT
1 series · 1 of 1 positions shown · non-contrast
Comparison: February 14, 2021

CLINICAL DATA: Patient leaking fluid from right leg. End-stage
renal disease. History of CHF.

EXAM:
PORTABLE CHEST 1 VIEW

[chest ap]
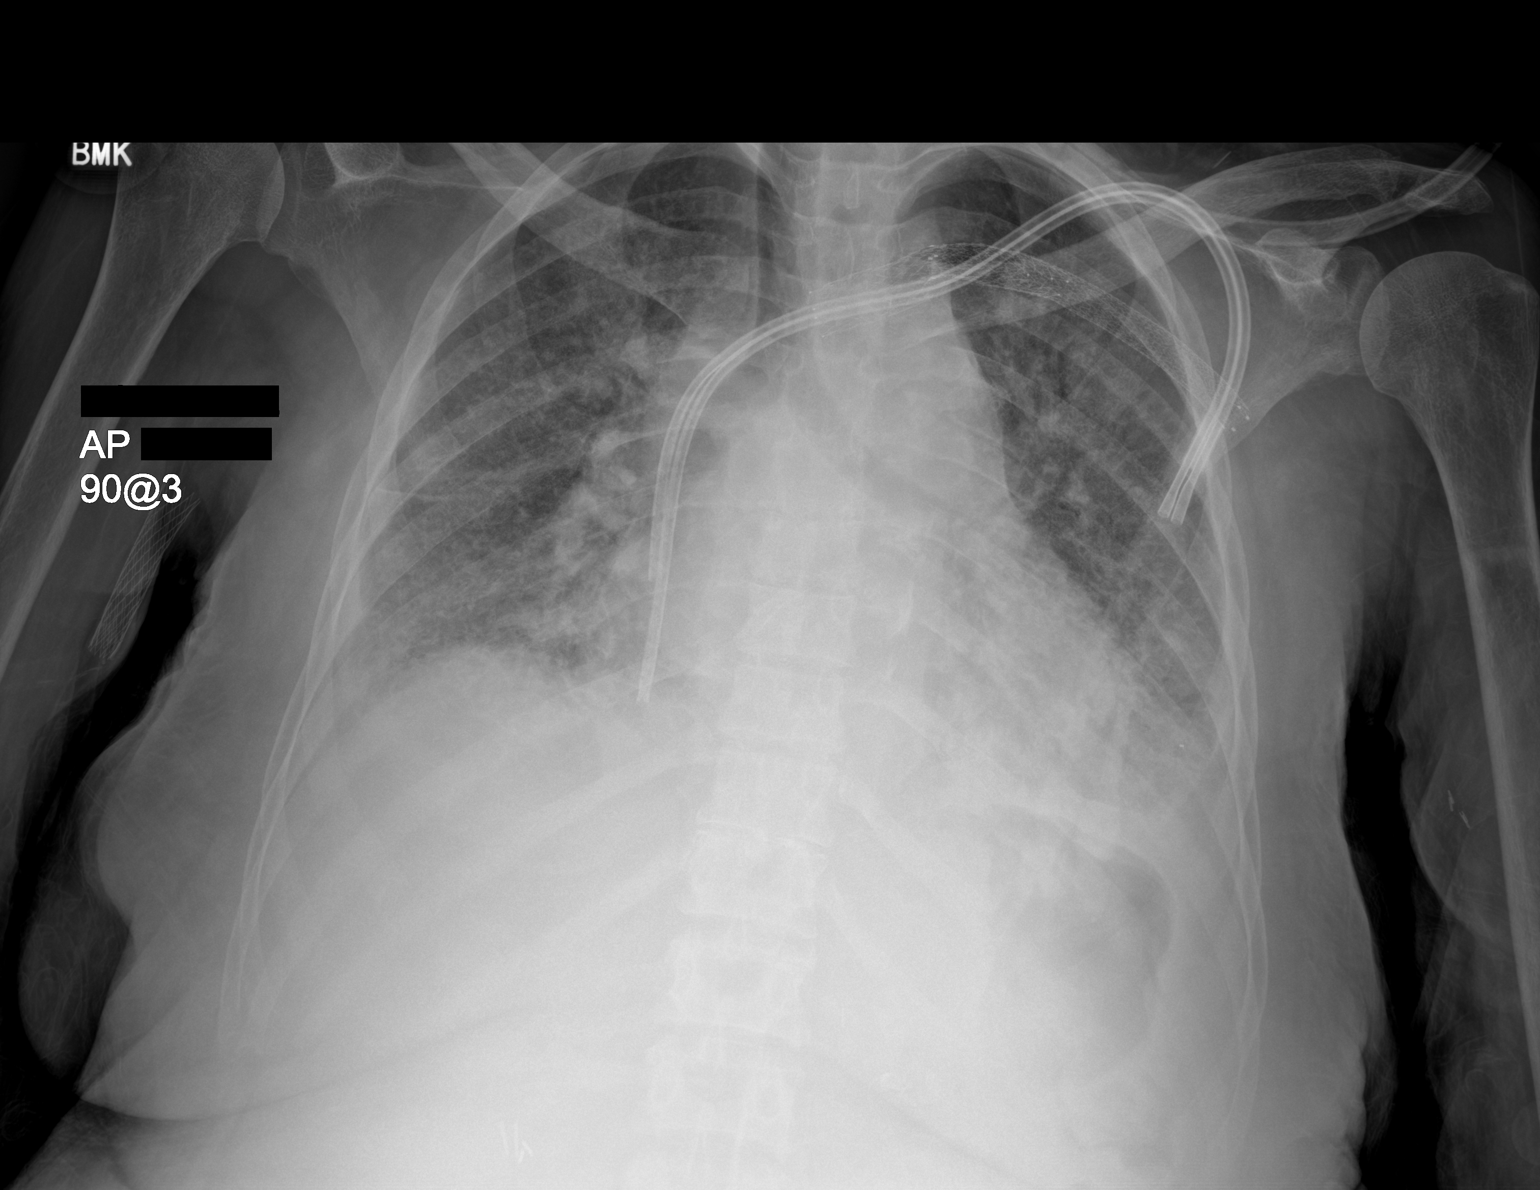

[1 of 1 positions shown; findings below may reference images not displayed]

FINDINGS: The cardiomediastinal silhouette is stable. Diffuse bilateral
interstitial opacities, similar in the interval. The dialysis
catheter is stable terminating near the caval atrial junction and in
the right atrium. No pneumothorax. No other changes.
IMPRESSION: Findings are most consistent with CHF.  Stable left central line.

## 2021-07-30 ENCOUNTER — Inpatient Hospital Stay: Payer: Medicare Other | Admitting: Nurse Practitioner

## 2021-08-04 IMAGING — CT CT HEAD W/O CM
3 of 7 series · 14 of 47 positions shown, 17 images · non-contrast
Comparison: 09/17/2020

CLINICAL DATA: Altered mental status

EXAM:
CT HEAD WITHOUT CONTRAST
TECHNIQUE: Contiguous axial images were obtained from the base of the skull
through the vertex without intravenous contrast.

[Series 4: head wo · axial · 0.45mm/px · z∈[-146,-26]mm · 9 of 30 slices shown, 12 images]
[im 3/30  brain]
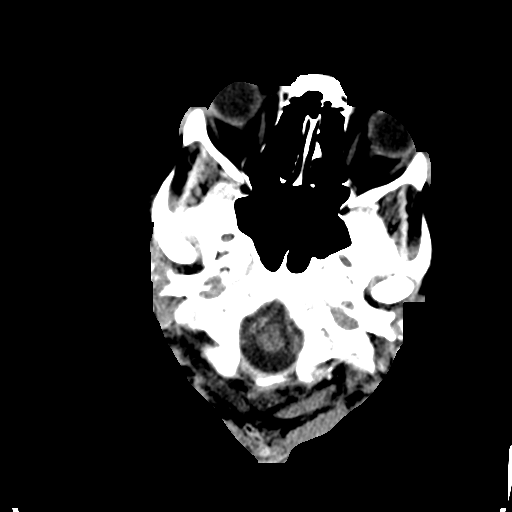
[im 3/30  bone]
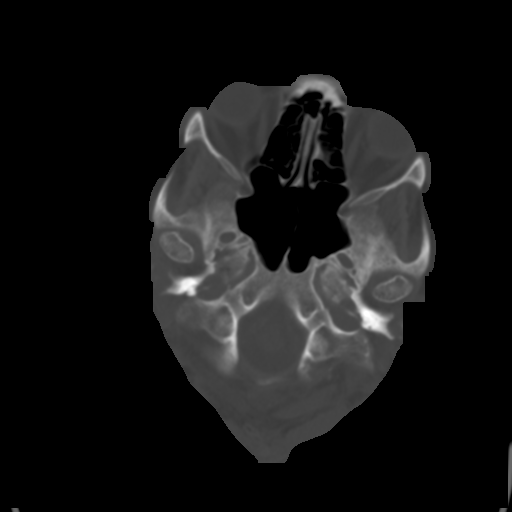
[im 6/30  brain]
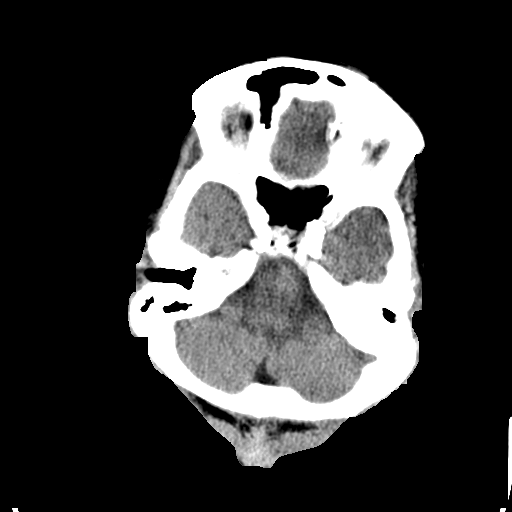
[im 8/30  brain]
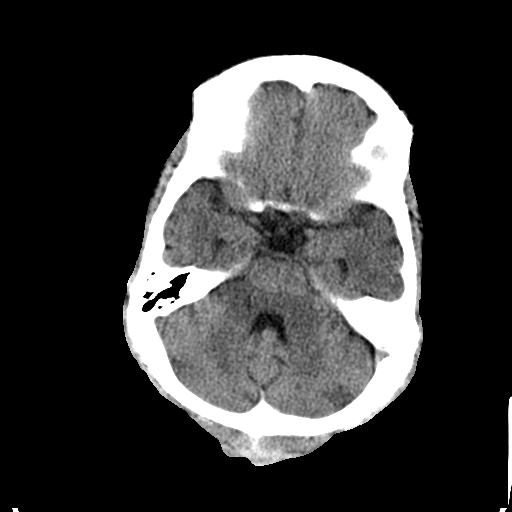
[im 11/30  brain]
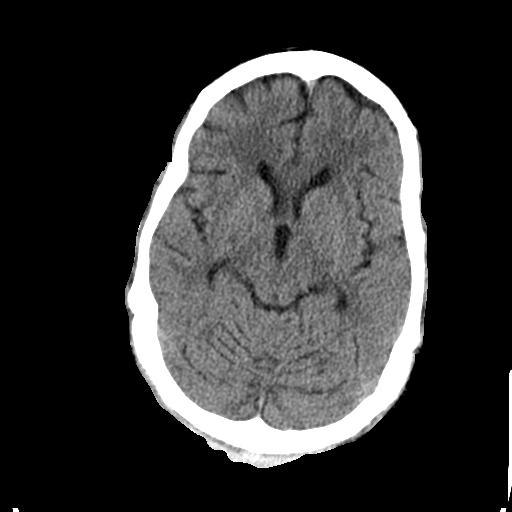
[im 16/30  brain]
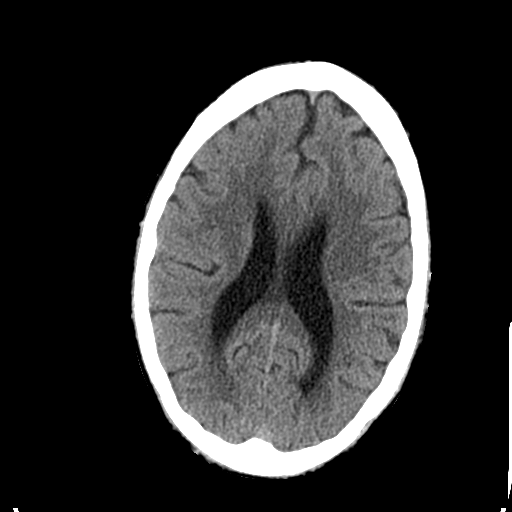
[im 16/30  bone]
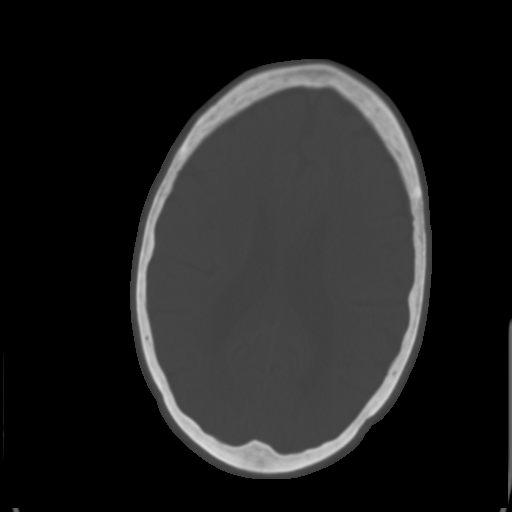
[im 19/30  brain]
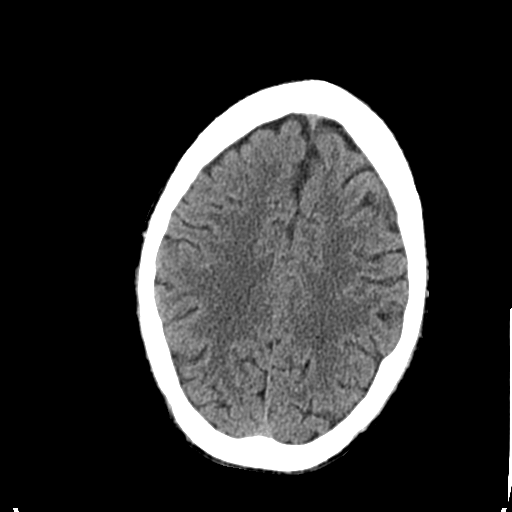
[im 22/30  brain]
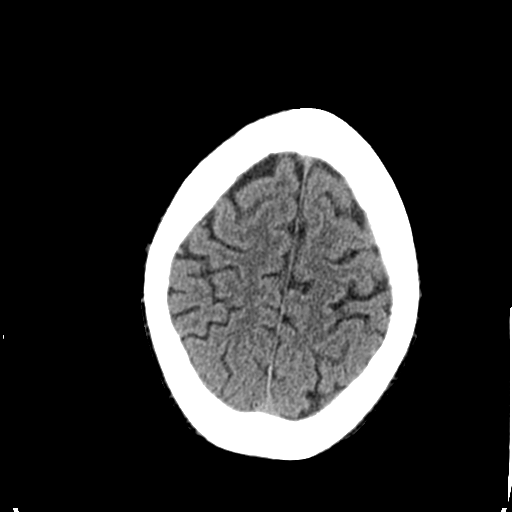
[im 24/30  brain]
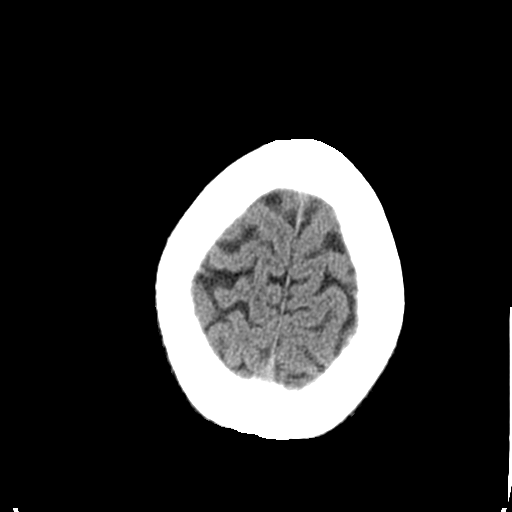
[im 27/30  brain]
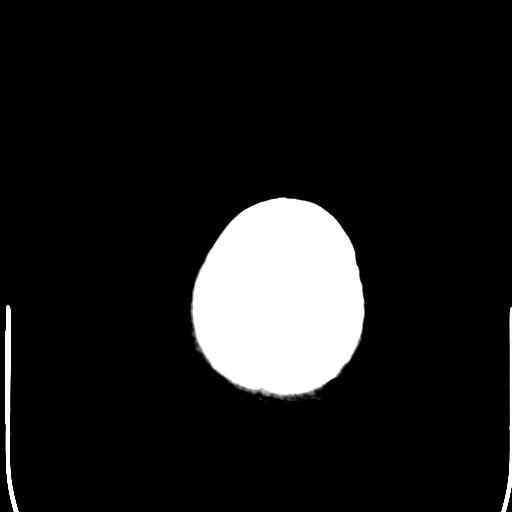
[im 27/30  bone]
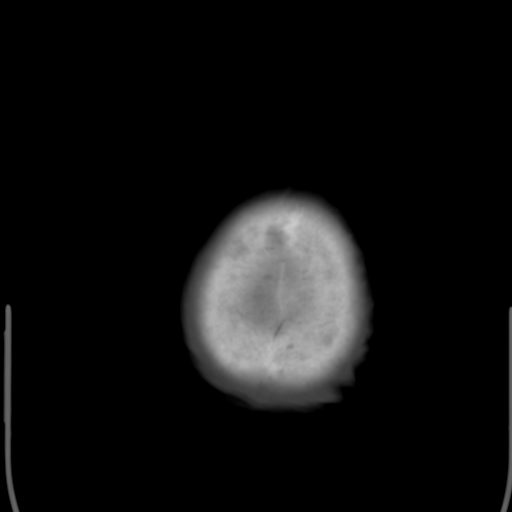

[Series 6: sagittal soft tissue · sagittal · 0.31mm/px · 2 of 56 slices shown]
[im 19/56  brain]
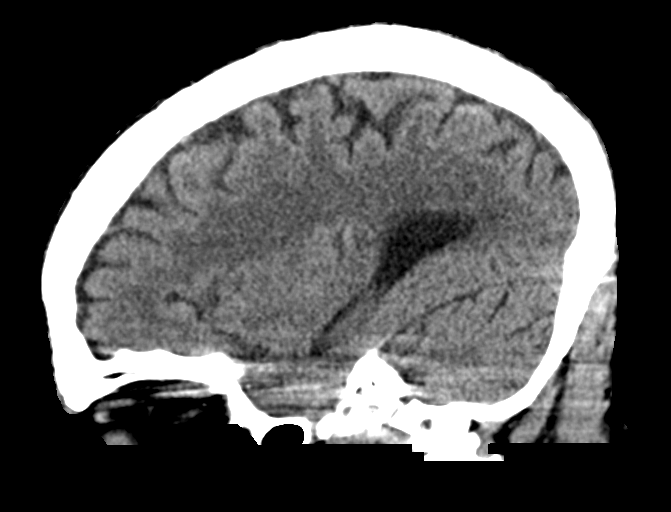
[im 37/56  brain]
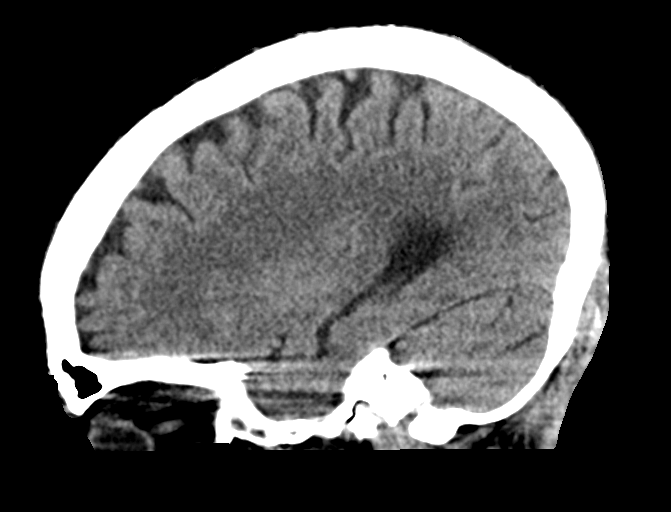

[Series 8: coronal soft tissue · coronal · 0.17mm/px · 3 of 71 slices shown]
[im 40/71  brain]
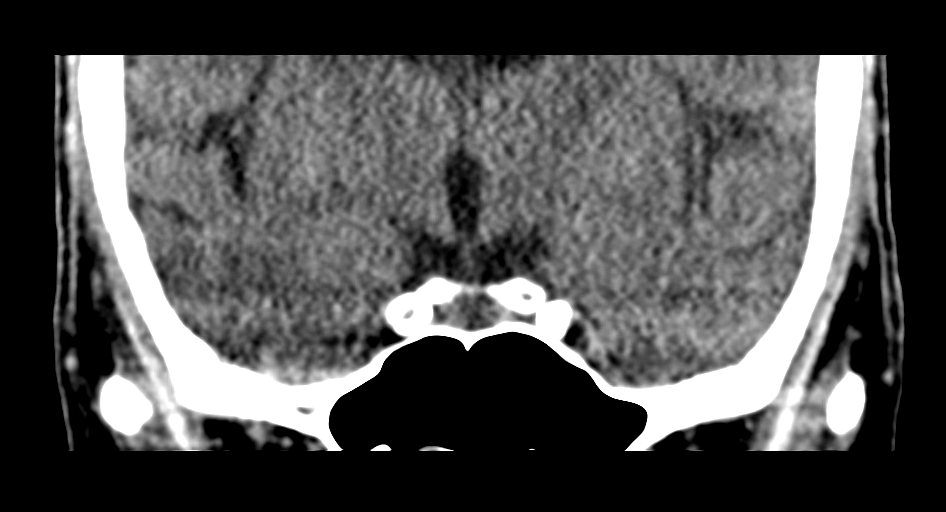
[im 48/71  brain]
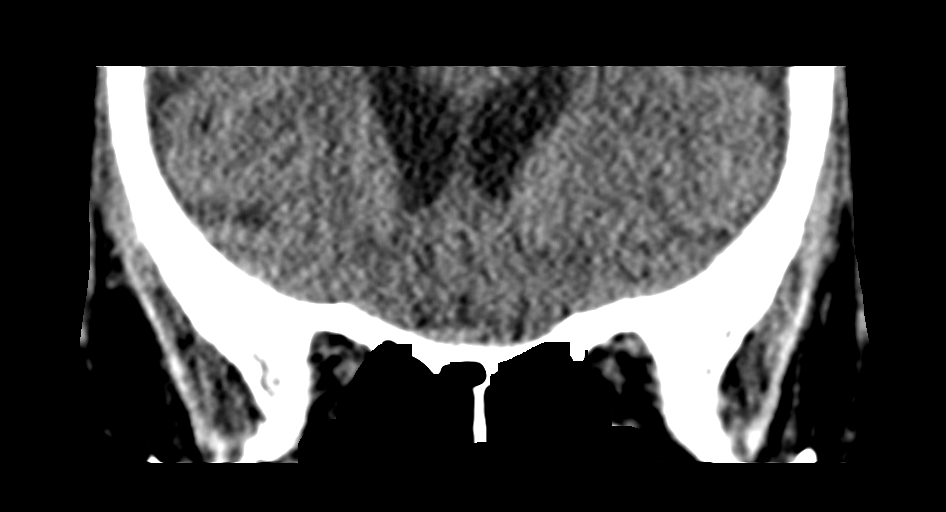
[im 55/71  brain]
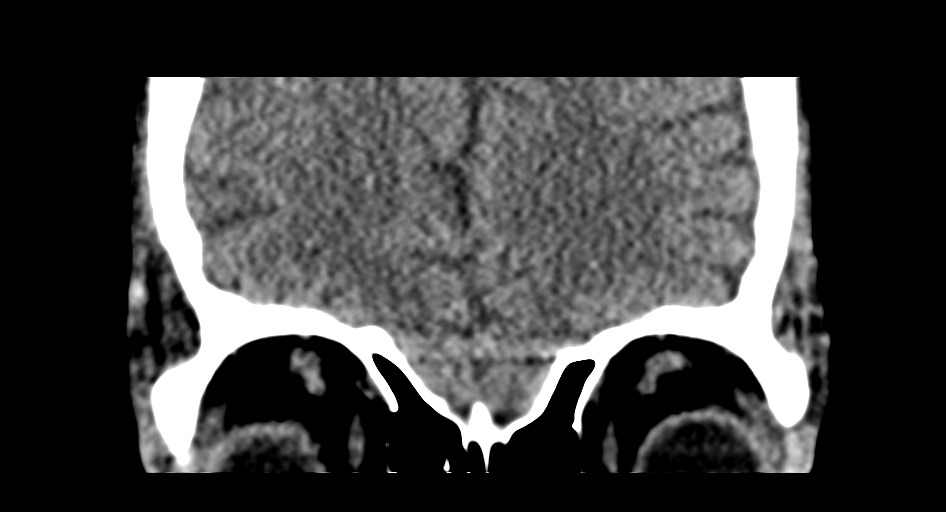

[14 of 47 positions shown; findings below may reference images not displayed]

FINDINGS: Brain: No evidence of acute infarction, hemorrhage, hydrocephalus,
extra-axial collection or mass lesion/mass effect.

Vascular: No hyperdense vessel or unexpected calcification.

Skull: Normal. Negative for fracture or focal lesion.

Sinuses/Orbits: The visualized paranasal sinuses are essentially
clear. The mastoid air cells are unopacified.

Other: None.
IMPRESSION: Normal head CT.

## 2021-08-05 IMAGING — DX DG SHOULDER 2+V*R*
3 series · 3 of 3 positions shown · non-contrast
Comparison: None.

CLINICAL DATA: Right shoulder pain

EXAM:
RIGHT SHOULDER - 2+ VIEW

[shoulder axial]
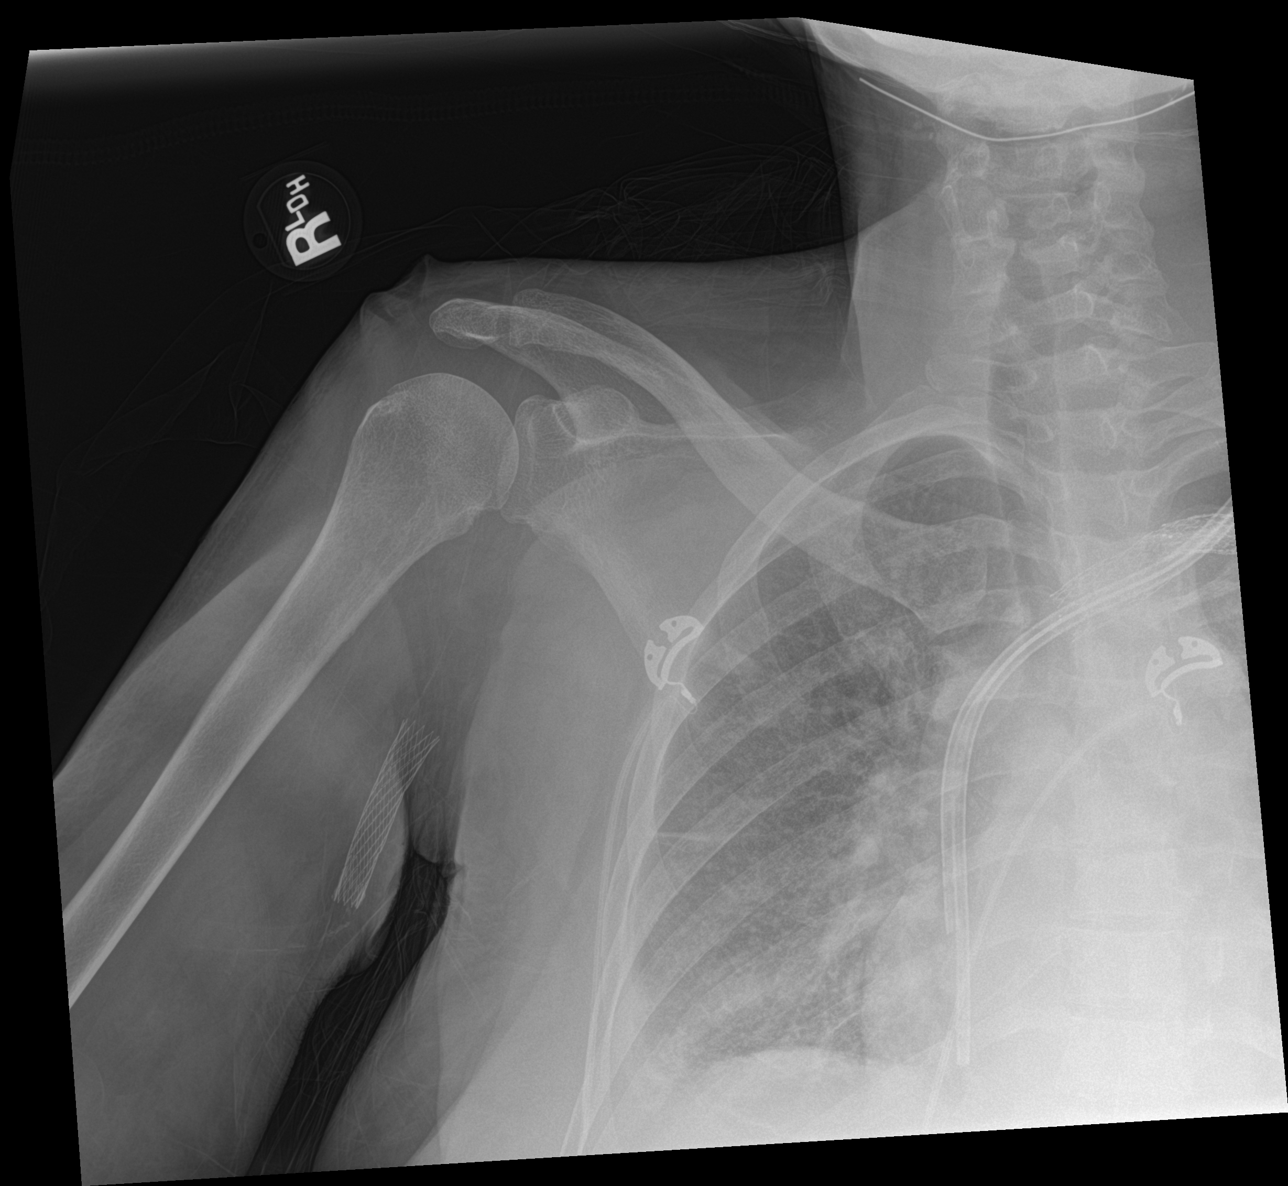

[shoulder obl (1 of 2)]
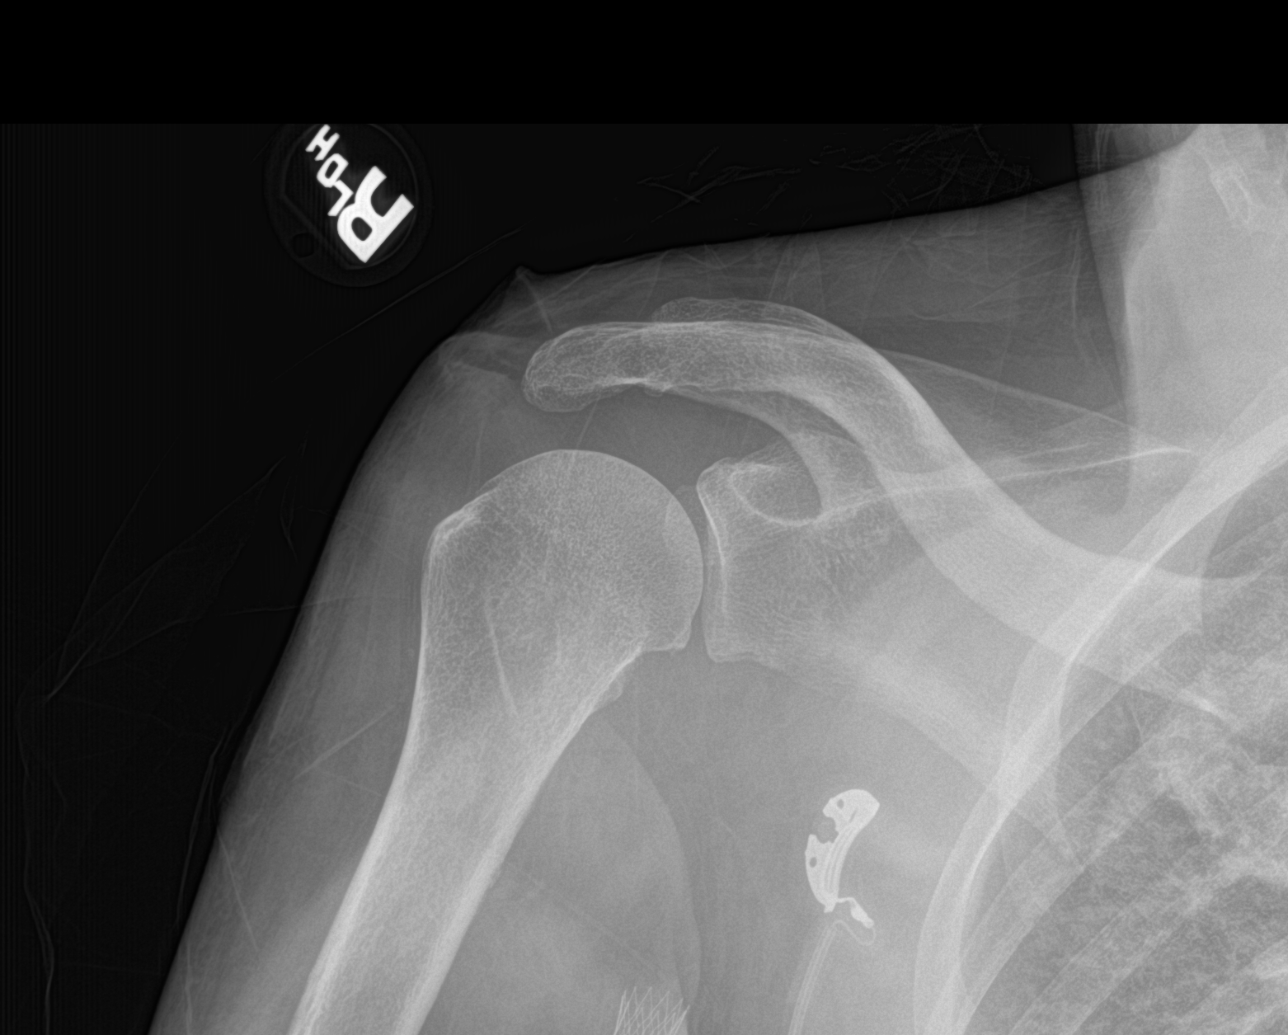

[shoulder obl (2 of 2)]
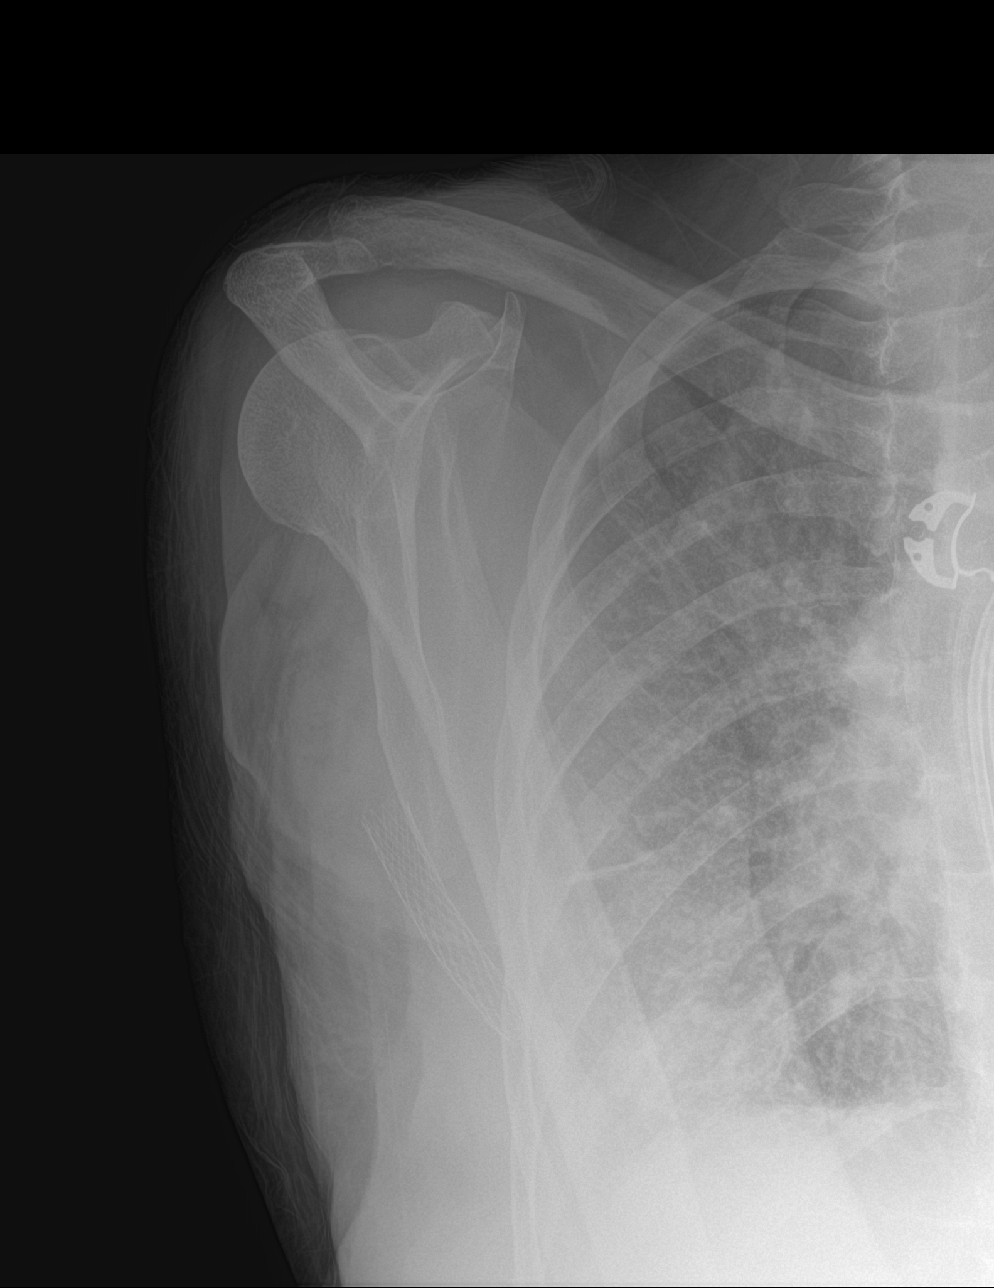

[3 of 3 positions shown; findings below may reference images not displayed]

FINDINGS: No acute fracture or dislocation is noted. No soft tissue
abnormality is seen. Dialysis catheter is noted from the left as
well as stenting in the right arm and left innominate vein. Mild
degenerative changes of the glenohumeral joint and acromioclavicular
joint are seen. Bony thorax appears within normal limits.
IMPRESSION: No acute abnormality noted.  Degenerative changes are seen.

## 2021-08-06 IMAGING — US US EXTREM  UP VENOUS*R*
1 series · 13 of 24 positions shown · non-contrast
Comparison: 08/11/2020

CLINICAL DATA: Right upper extremity over 1 year



[Series 1: us venous img upper uni right (dvt) · portal-venous · 60 acquisitions, 13 frames shown]
[im 1/60]
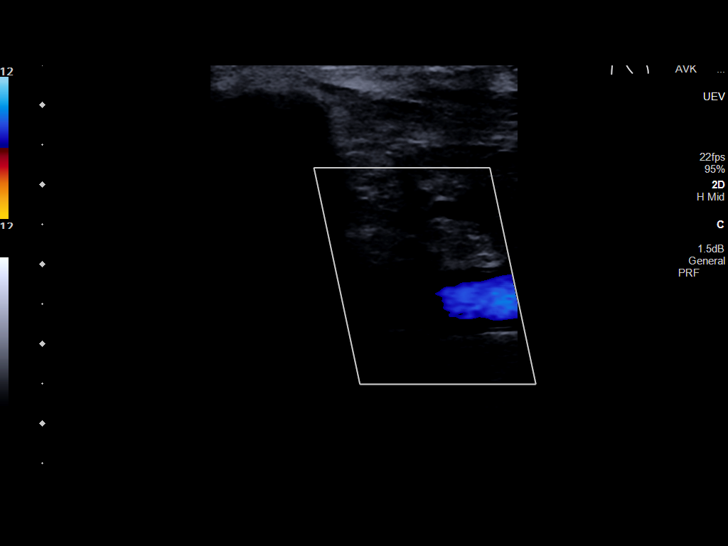
[im 6/60]
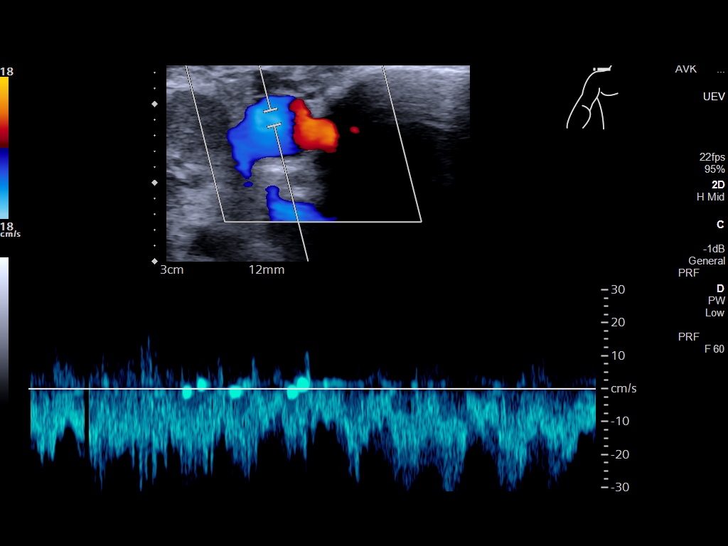
[im 11/60]
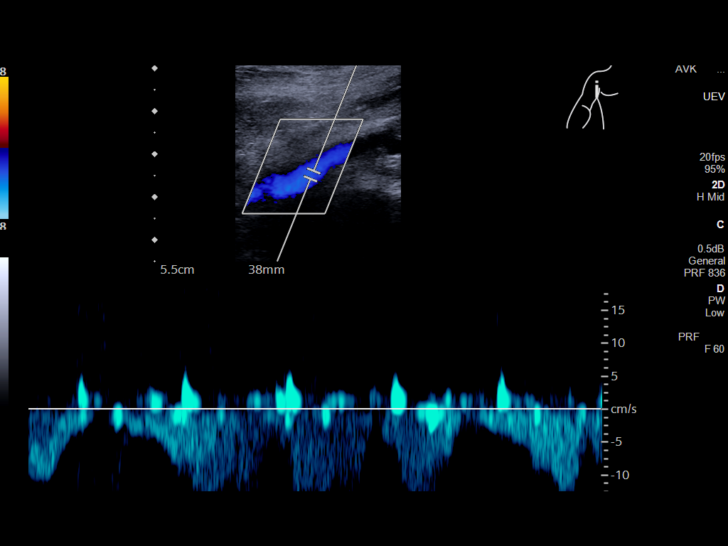
[im 18/60]
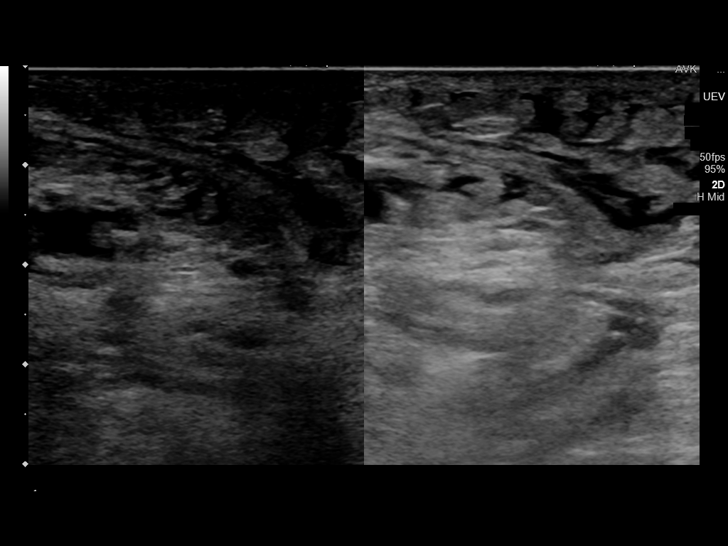
[im 24/60]
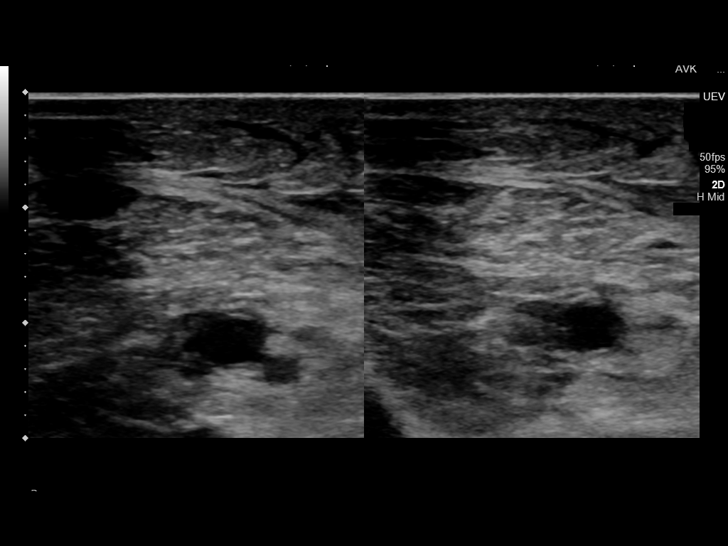
[im 29/60]
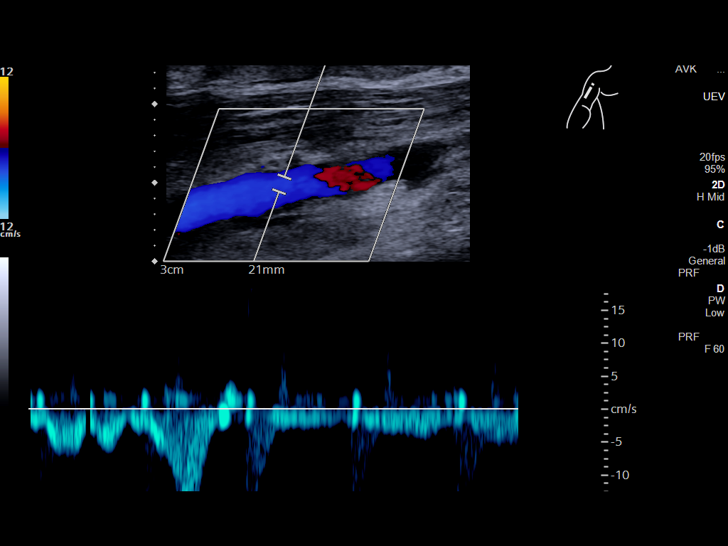
[im 34/60]
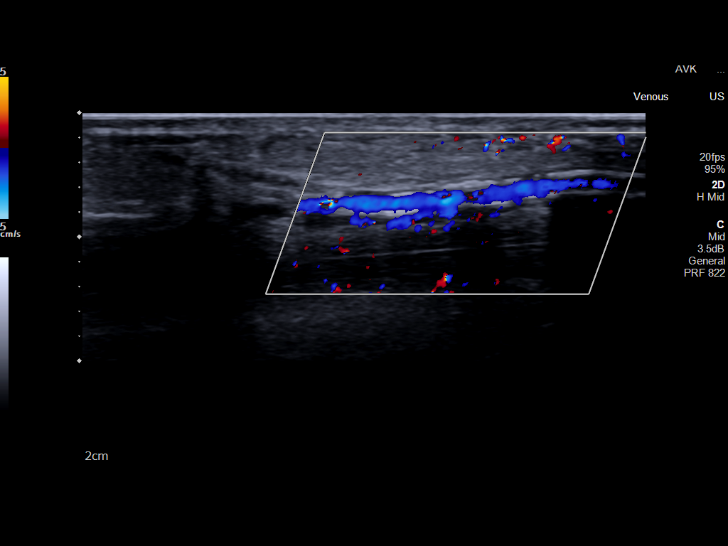
[im 36/60]
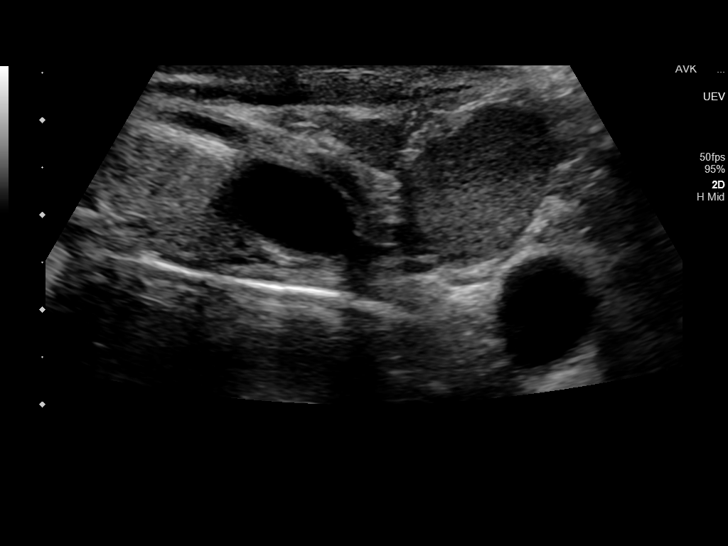
[im 42/60]
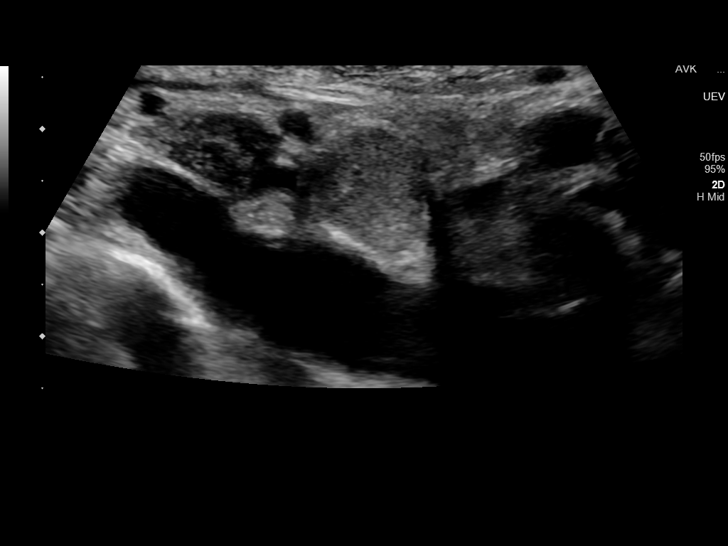
[im 47/60]
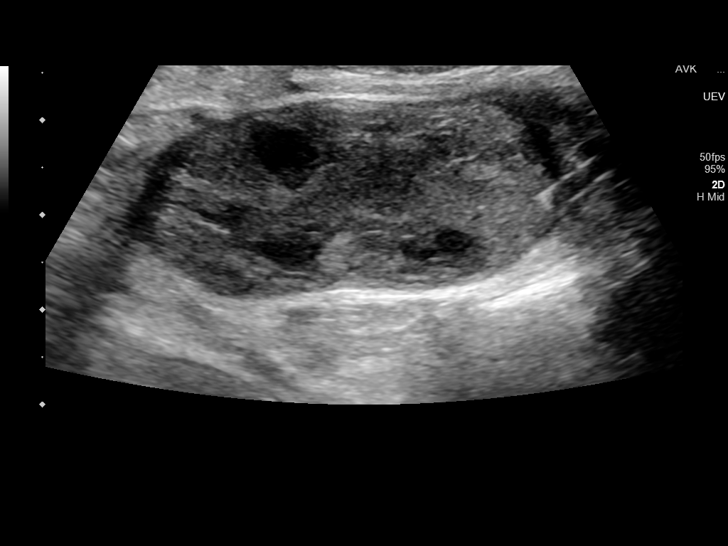
[im 52/60]
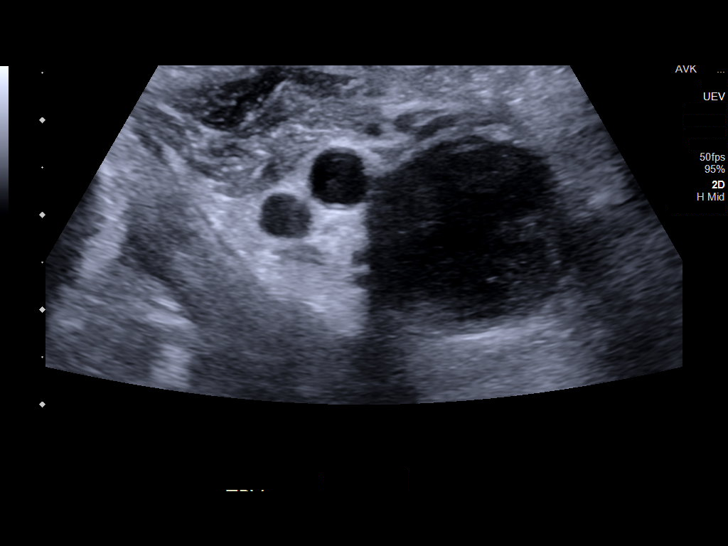
[im 54/60]
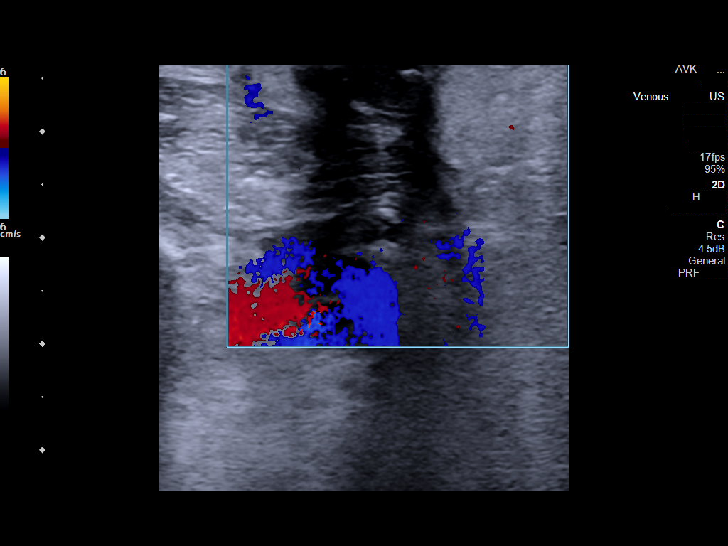
[im 60/60]
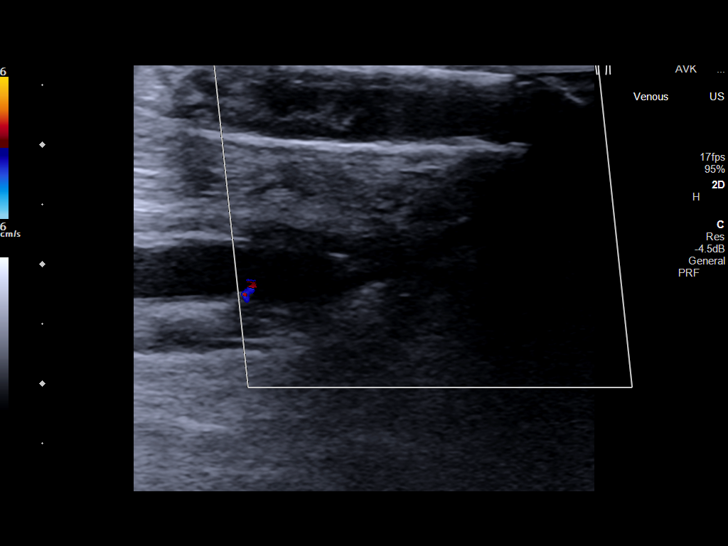

[13 of 24 positions shown; findings below may reference images not displayed]

FINDINGS: Contralateral Subclavian Vein: Respiratory phasicity is normal and
symmetric with the symptomatic side. No evidence of thrombus. Normal
compressibility.

Internal Jugular Vein: No evidence of thrombus. Normal
compressibility, respiratory phasicity and response to augmentation.

Subclavian Vein: No evidence of thrombus. Normal compressibility,
respiratory phasicity and response to augmentation.

Axillary Vein: No evidence of thrombus. Normal compressibility,
respiratory phasicity and response to augmentation.

Cephalic Vein: No evidence of thrombus. Normal compressibility,
respiratory phasicity and response to augmentation.

Basilic Vein: No evidence of thrombus. Normal compressibility,
respiratory phasicity and response to augmentation.

Brachial Veins: No evidence of thrombus. Normal compressibility,
respiratory phasicity and response to augmentation.

Radial Veins: No evidence of thrombus. Normal compressibility,
respiratory phasicity and response to augmentation.

Ulnar Veins: No evidence of thrombus. Normal compressibility,
respiratory phasicity and response to augmentation.

Other Findings: 2.0 x 1.4 x 1.1 cm ovoid hypoechoic structure in the
right supraclavicular region is suspicious for abnormally enlarged
lymph node.

Thrombosed right upper extremity graft is noted.

In the right medial upper arm there is a 5.1 x 2.1 x 2.1 cm ovoid
hypoechoic mass. This may be related to a thrombosed pseudoaneurysm.
IMPRESSION: 1. No right upper extremity DVT.
2. Abnormally enlarged lymph node seen in the right supraclavicular
region measuring 2.0 x 1.4 x 1.1 cm should be further evaluated with
contrast enhanced soft tissue neck CT.
[DATE] x 2.1 x 2.1 cm ovoid hypoechoic mass in the right upper arm
is most likely a thrombosed pseudoaneurysm related to occluded
dialysis graft. Recommend confirmation with contrast enhanced MRI or
CT of the right upper arm.

## 2021-08-27 ENCOUNTER — Telehealth: Payer: Self-pay | Admitting: Oncology

## 2021-08-27 NOTE — Telephone Encounter (Signed)
Anderson Malta spoke to her on the phone and made an appt for pt to come back

## 2021-08-27 NOTE — Telephone Encounter (Signed)
Pt called to schedule a appt. Please give her a call back at [phone]

## 2021-08-31 IMAGING — CT CT ABD-PELV W/O CM
2 of 4 series · 16 of 46 positions shown, 18 images · non-contrast
Comparison: CT 06/16/2020

CLINICAL DATA: Diffuse abdominal pain

EXAM:
CT ABDOMEN AND PELVIS WITHOUT CONTRAST
TECHNIQUE: Multidetector CT imaging of the abdomen and pelvis was performed
following the standard protocol without IV contrast.

[Series 2: routine abd/pel wo · axial · 0.87mm/px · z∈[-502,-82]mm · 13 of 92 slices shown, 15 images]
[im 4/92  soft-tissue]
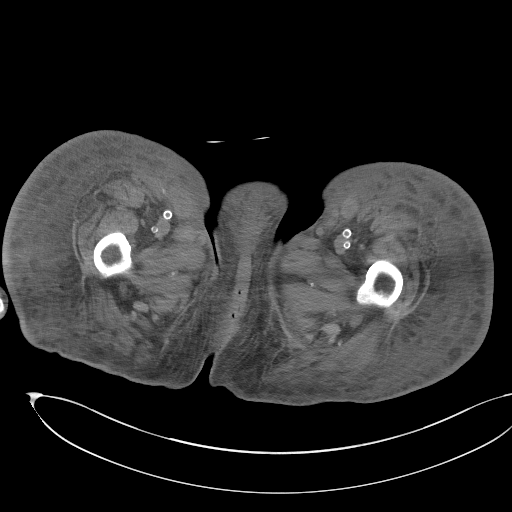
[im 4/92  bone]
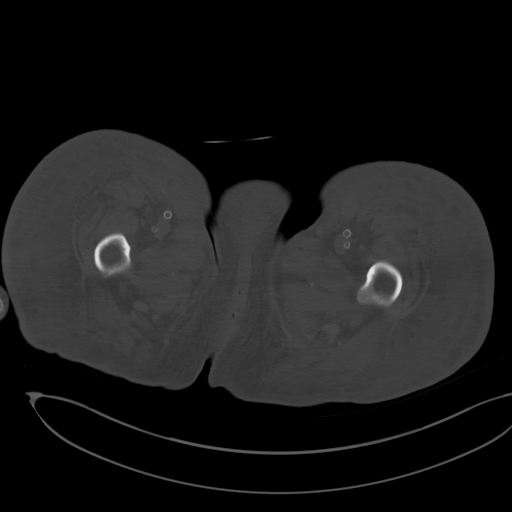
[im 11/92  soft-tissue]
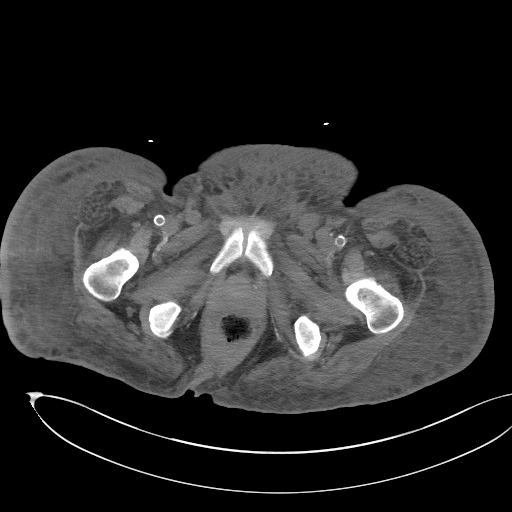
[im 19/92  soft-tissue]
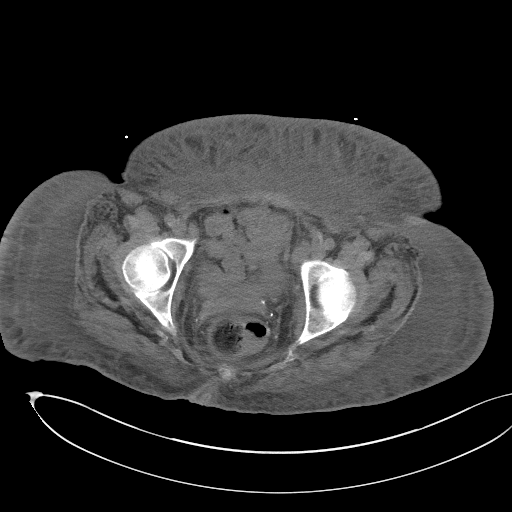
[im 26/92  soft-tissue]
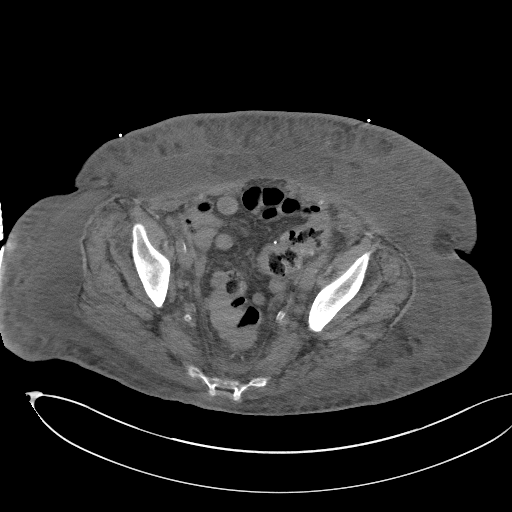
[im 33/92  soft-tissue]
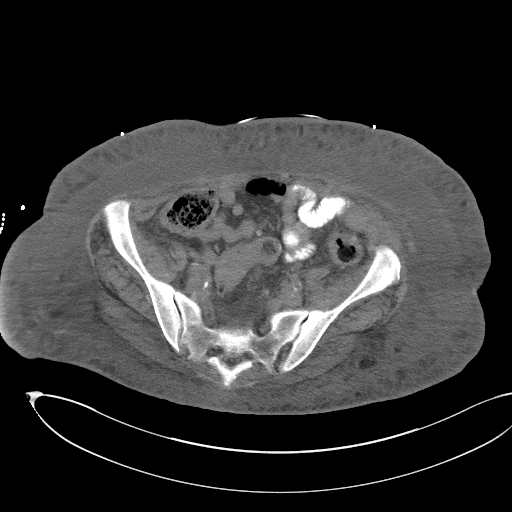
[im 41/92  soft-tissue]
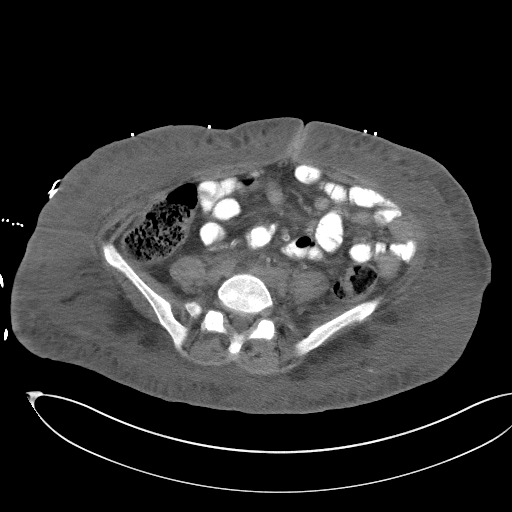
[im 48/92  soft-tissue]
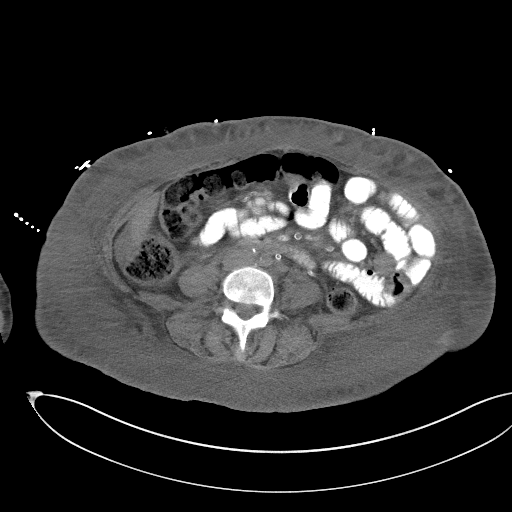
[im 51/92  soft-tissue]
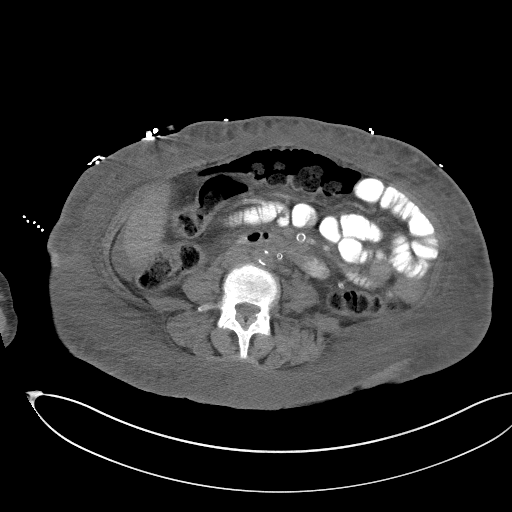
[im 59/92  soft-tissue]
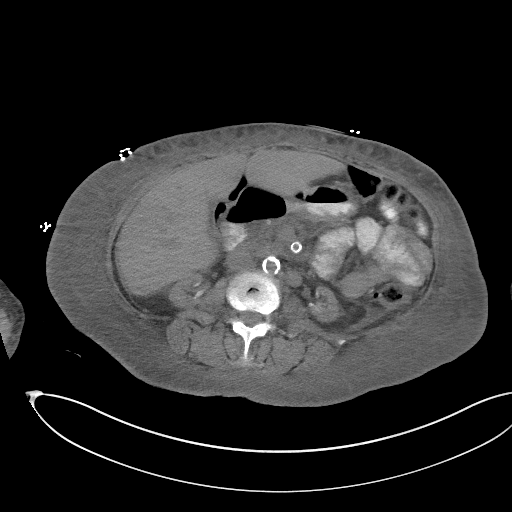
[im 59/92  bone]
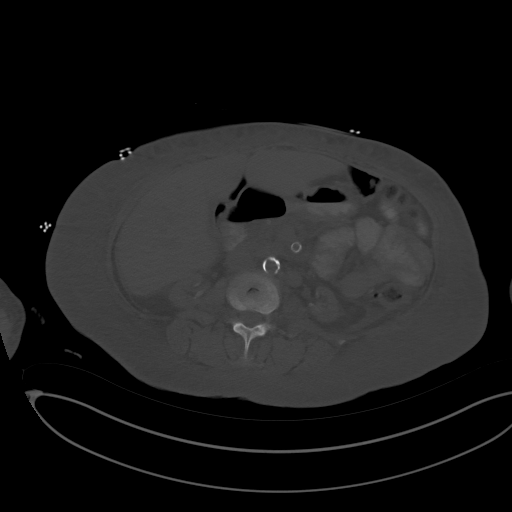
[im 66/92  soft-tissue]
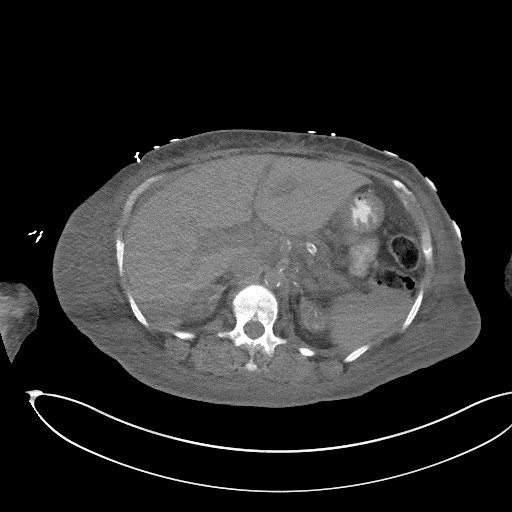
[im 73/92  soft-tissue]
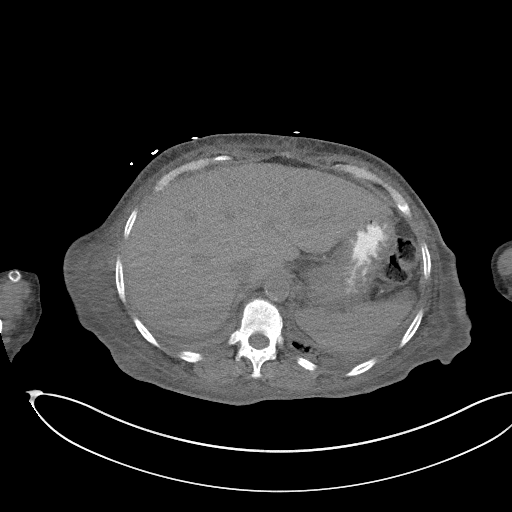
[im 81/92  soft-tissue]
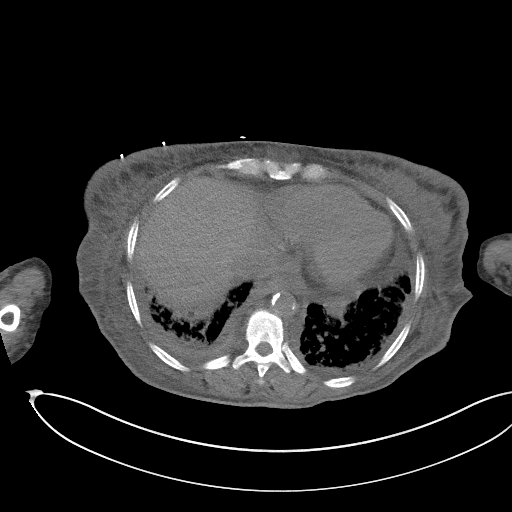
[im 88/92  soft-tissue]
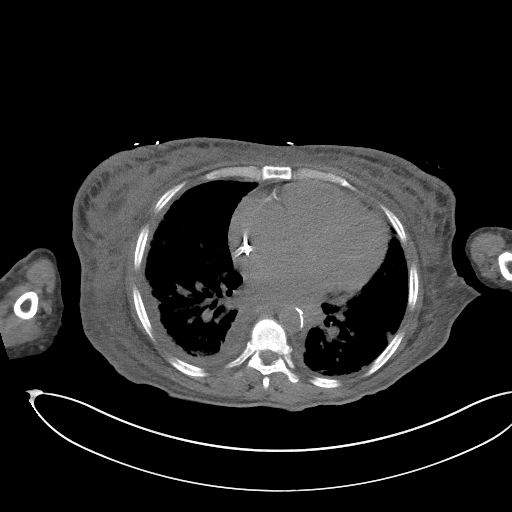

[Series 5: coronal st · coronal · 0.88mm/px · 3 of 81 slices shown]
[im 27/81  soft-tissue]
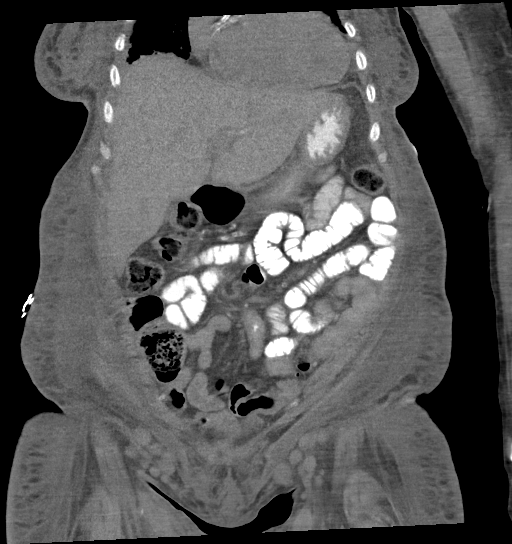
[im 36/81  soft-tissue]
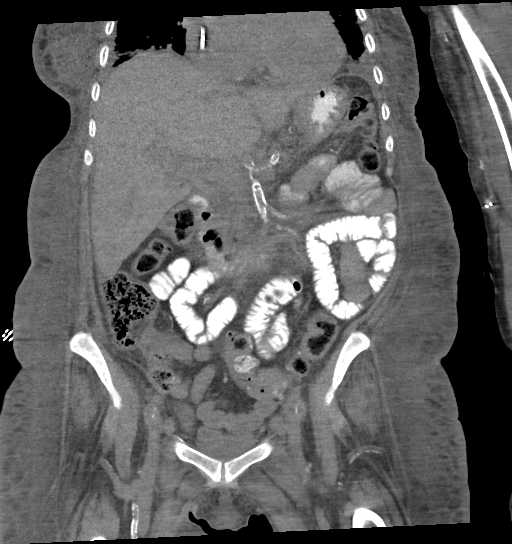
[im 45/81  soft-tissue]
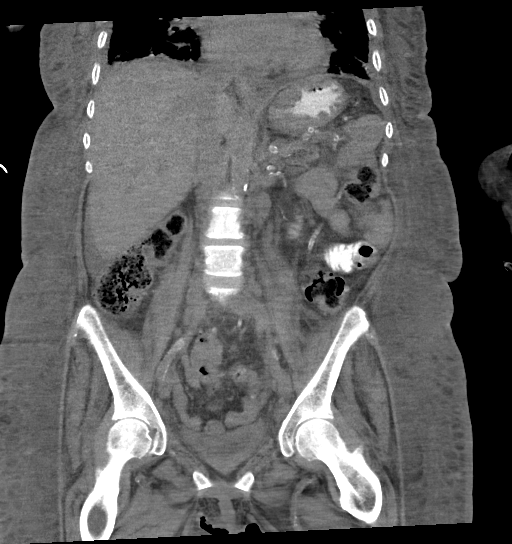

[16 of 46 positions shown; findings below may reference images not displayed]

FINDINGS: Lower chest: Lung bases demonstrate small bilateral pleural
effusions and cardiomegaly. Hazy lung densities at the bases likely
due to edema. Atelectasis at both bases.

Hepatobiliary: Status post cholecystectomy. No focal hepatic
abnormality. No biliary dilatation.

Pancreas: Unremarkable. No pancreatic ductal dilatation or
surrounding inflammatory changes.

Spleen: Normal in size without focal abnormality.

Adrenals/Urinary Tract: Adrenal glands are normal. Atrophic native
kidneys without hydronephrosis. Stable cyst in the mid left kidney.
The bladder is nearly empty

Stomach/Bowel: The stomach is nonenlarged. No dilated small bowel.
No acute bowel wall thickening. Diverticular disease of left colon
without acute wall thickening

Vascular/Lymphatic: Extensive vascular calcification. Mild
retroperitoneal lymph nodes measuring up to 12 mm

Reproductive: Status post hysterectomy. No adnexal masses.

Other: Negative for free air. Small free fluid within the abdomen
and pelvis. Extensive edema within the subcutaneous soft tissues.

Musculoskeletal: No acute osseous abnormality
IMPRESSION: 1. Cardiomegaly with small bilateral pleural effusions and hazy lung
densities likely due to edema. Small amount of abdominopelvic
ascites. Extensive edema within the subcutaneous soft tissues
consistent with anasarca.
2. Native atrophic kidneys without hydronephrosis
3. Negative for bowel obstruction or bowel wall thickening. Left
colon diverticular disease without acute wall thickening

## 2021-08-31 IMAGING — CR DG CHEST 2V
1 series · 2 of 2 positions shown · non-contrast
Comparison: Chest x-ray 03/29/2021, CT chest 08/10/2020

CLINICAL DATA: Suspected sepsis.

EXAM:
CHEST - 2 VIEW

[Series 1: dg chest 2 view · 0.14mm/px · 2 of 2 slices shown]
[im 1/2]
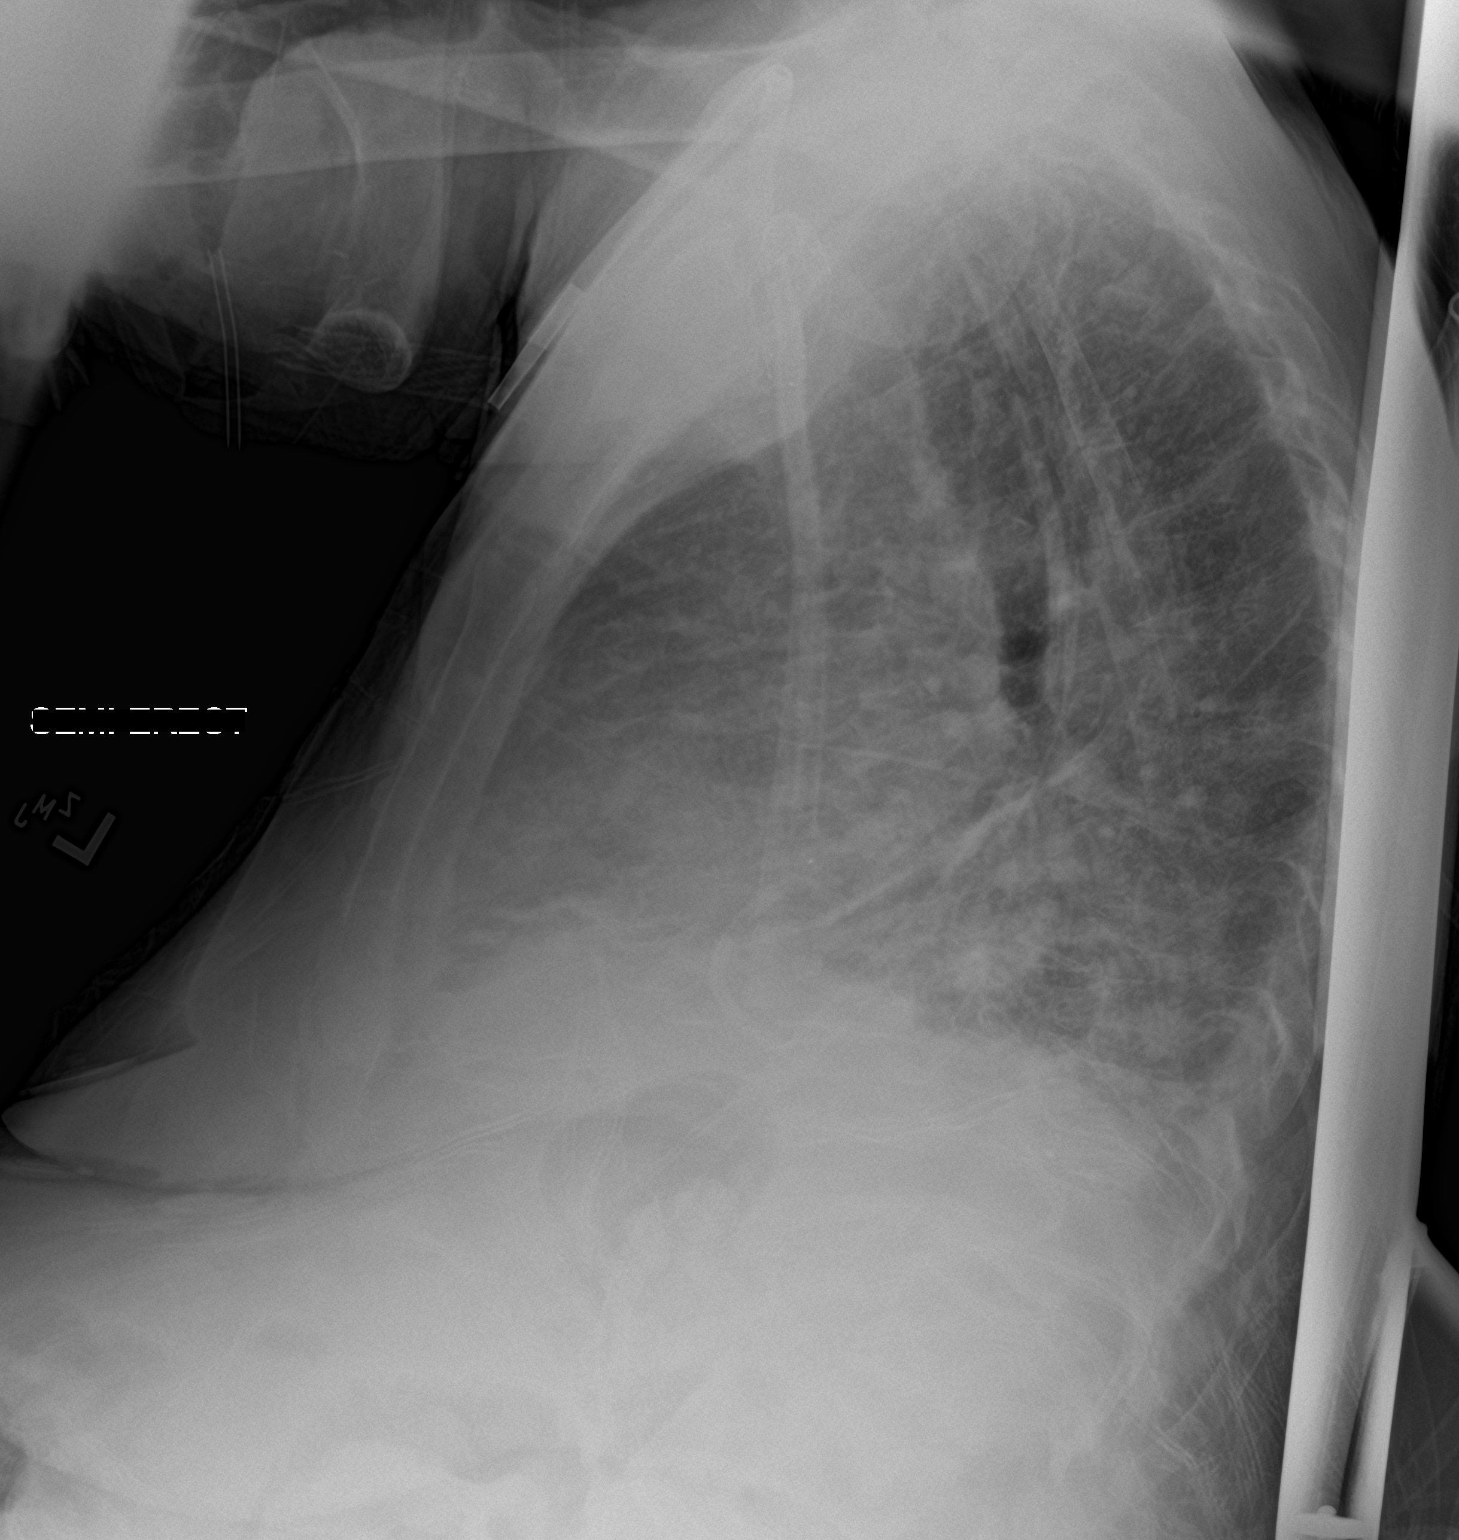
[im 2/2]
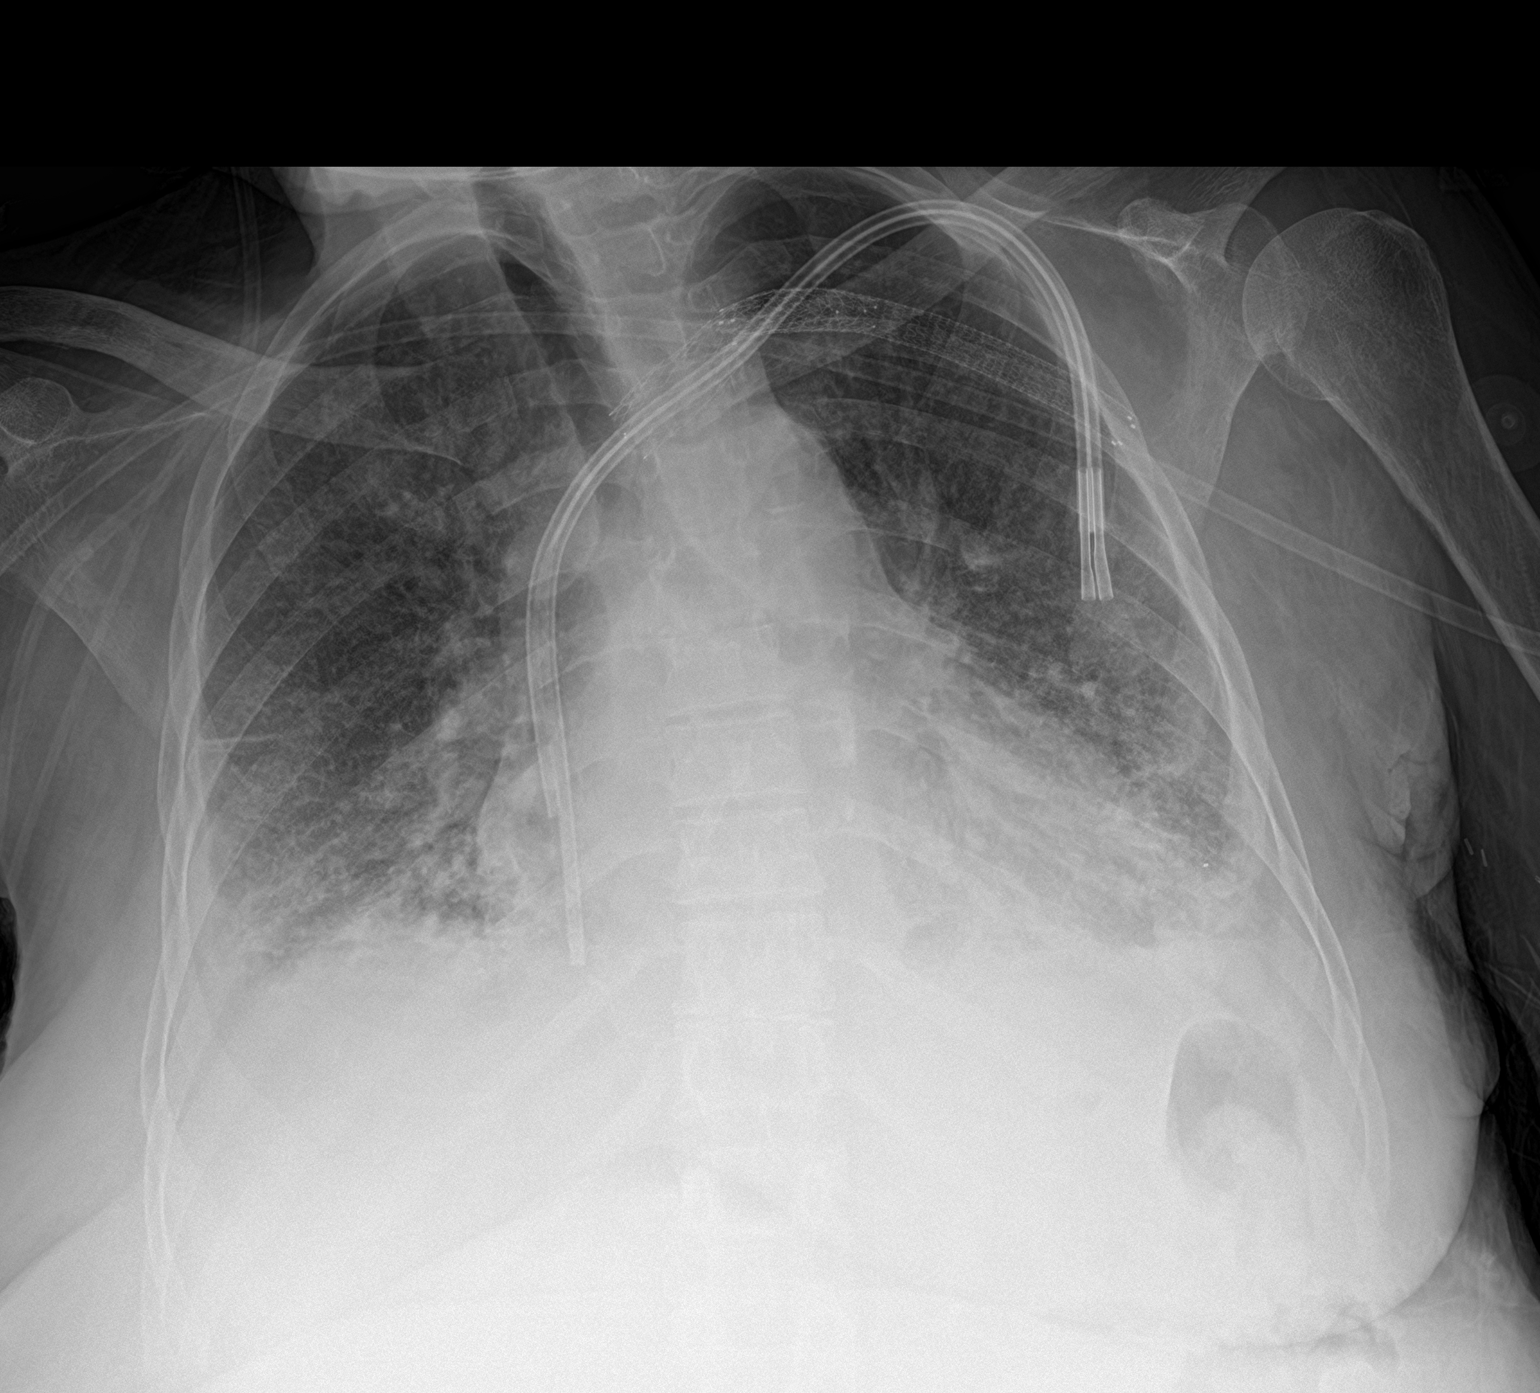

[2 of 2 positions shown; findings below may reference images not displayed]

FINDINGS: Left dialysis catheter with tips overlying the right atrium. Left
subclavian vascular stent again noted in similar position.

Slightly more prominent cardiac silhouette which may be due to
technique in a patient with an AP sitting radiograph. Otherwise the
heart size and mediastinal contours are unchanged. No definite
pericardial effusion on lateral view with limited evaluation.
Question coronary stent.

Bibasilar patchy airspace opacities including of the lingula. Nodule
like density on lateral view. Coarsened interstitial markings with
no overt pulmonary edema. Persistent bilateral trace to small volume
pleural effusions. No pneumothorax.

No acute osseous abnormality.
IMPRESSION: 1. Persistent bilateral trace to small volume pleural effusions with
associated patchy airspace opacities of bilateral lobe lower lobes
and lingula likely representing infection/inflammation. Nodular-like
density on lateral view in the lower lobe. Followup PA and lateral
chest X-ray is recommended in 3-4 weeks following therapy to ensure
resolution and exclude underlying malignancy.
2. Cardiomegaly with slightly more prominent cardiac silhouette
which may be due to technique in a patient with an AP sitting
radiograph.

## 2021-09-01 IMAGING — DX DG CHEST 1V PORT
1 series · 1 of 1 positions shown · non-contrast
Comparison: 04/25/2021

CLINICAL DATA: Check endotracheal tube placement

EXAM:
PORTABLE CHEST 1 VIEW

[chest ap]
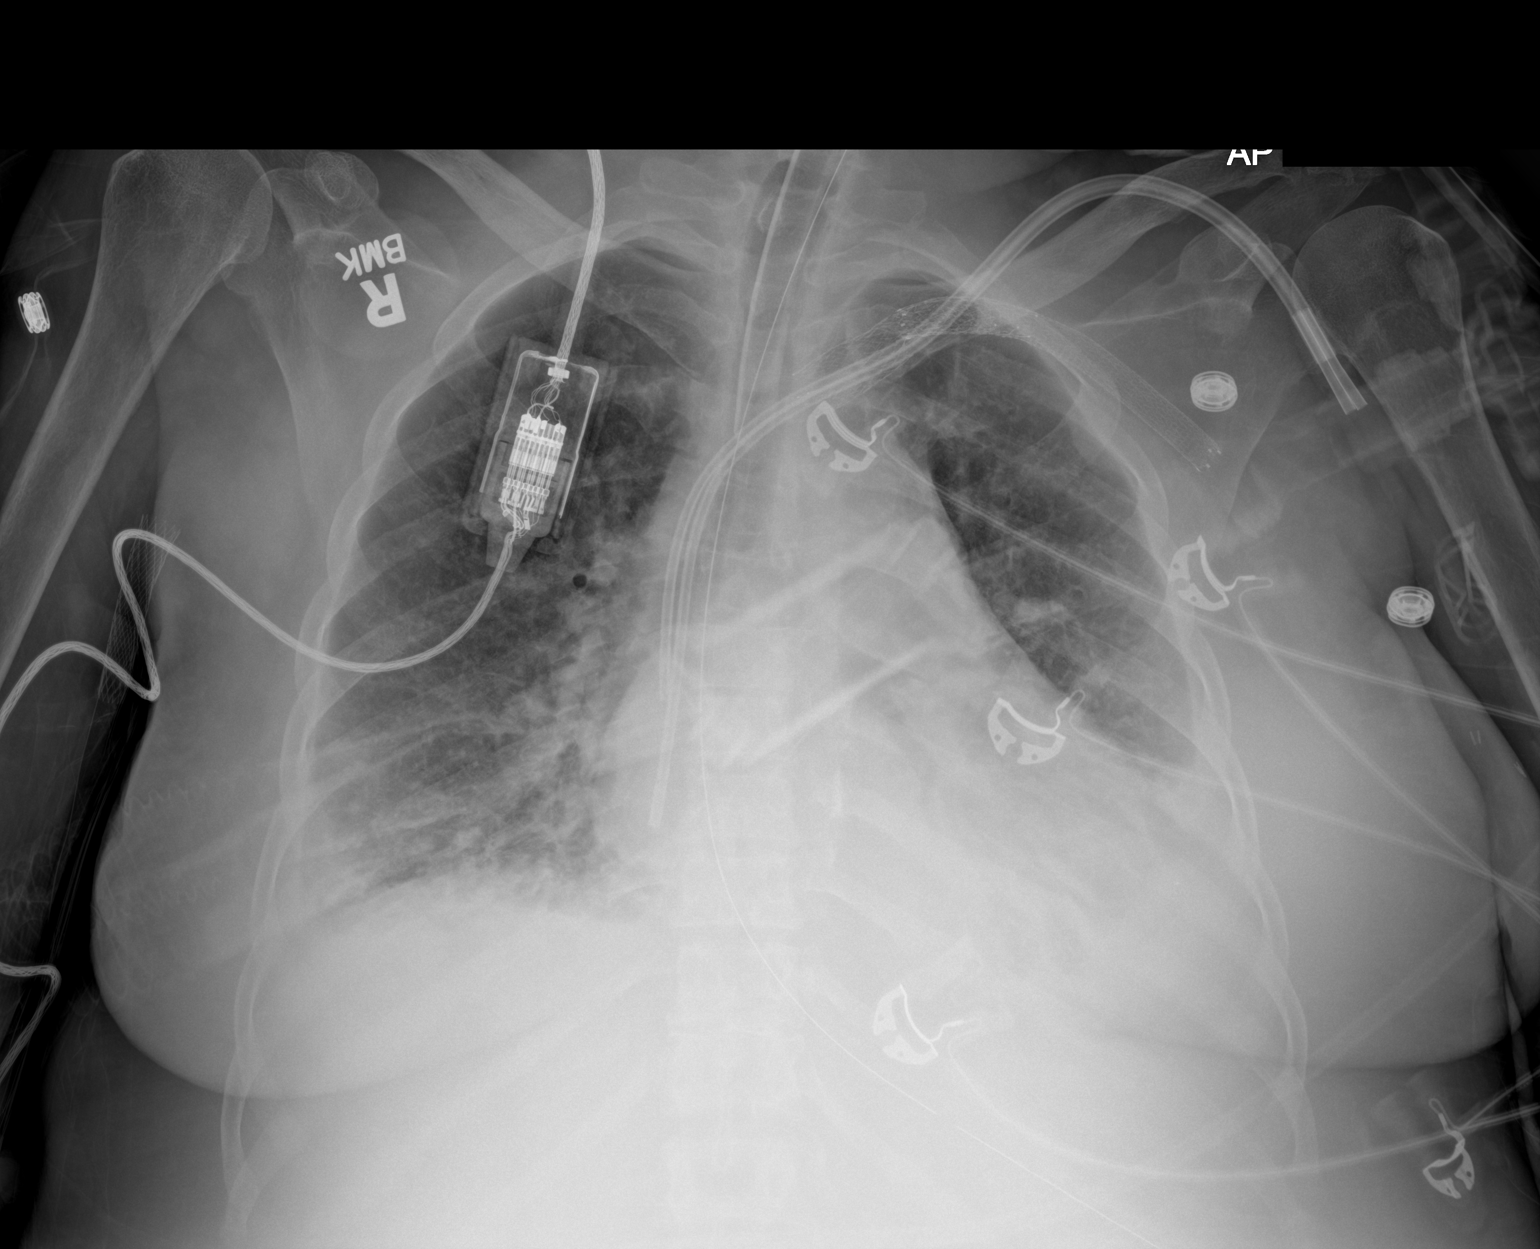

[1 of 1 positions shown; findings below may reference images not displayed]

FINDINGS: Cardiac shadow is enlarged but stable. Dialysis catheter is again
noted on the left as well as prior vascular stenting on the left.
Endotracheal tube is noted in satisfactory position. Gastric
catheter is noted extending into the stomach. Persistent bibasilar
densities are noted left greater than right with associated
effusions. The overall appearance is stable from the prior exam. No
bony abnormality is noted.
IMPRESSION: Tubes and lines in satisfactory position.

Persistent bibasilar opacity left greater than right.

## 2021-09-01 IMAGING — DX DG ABDOMEN 1V
1 series · 1 of 1 positions shown · non-contrast
Comparison: None.

CLINICAL DATA: Check gastric catheter placement

EXAM:
ABDOMEN - 1 VIEW

[abdomen supine]
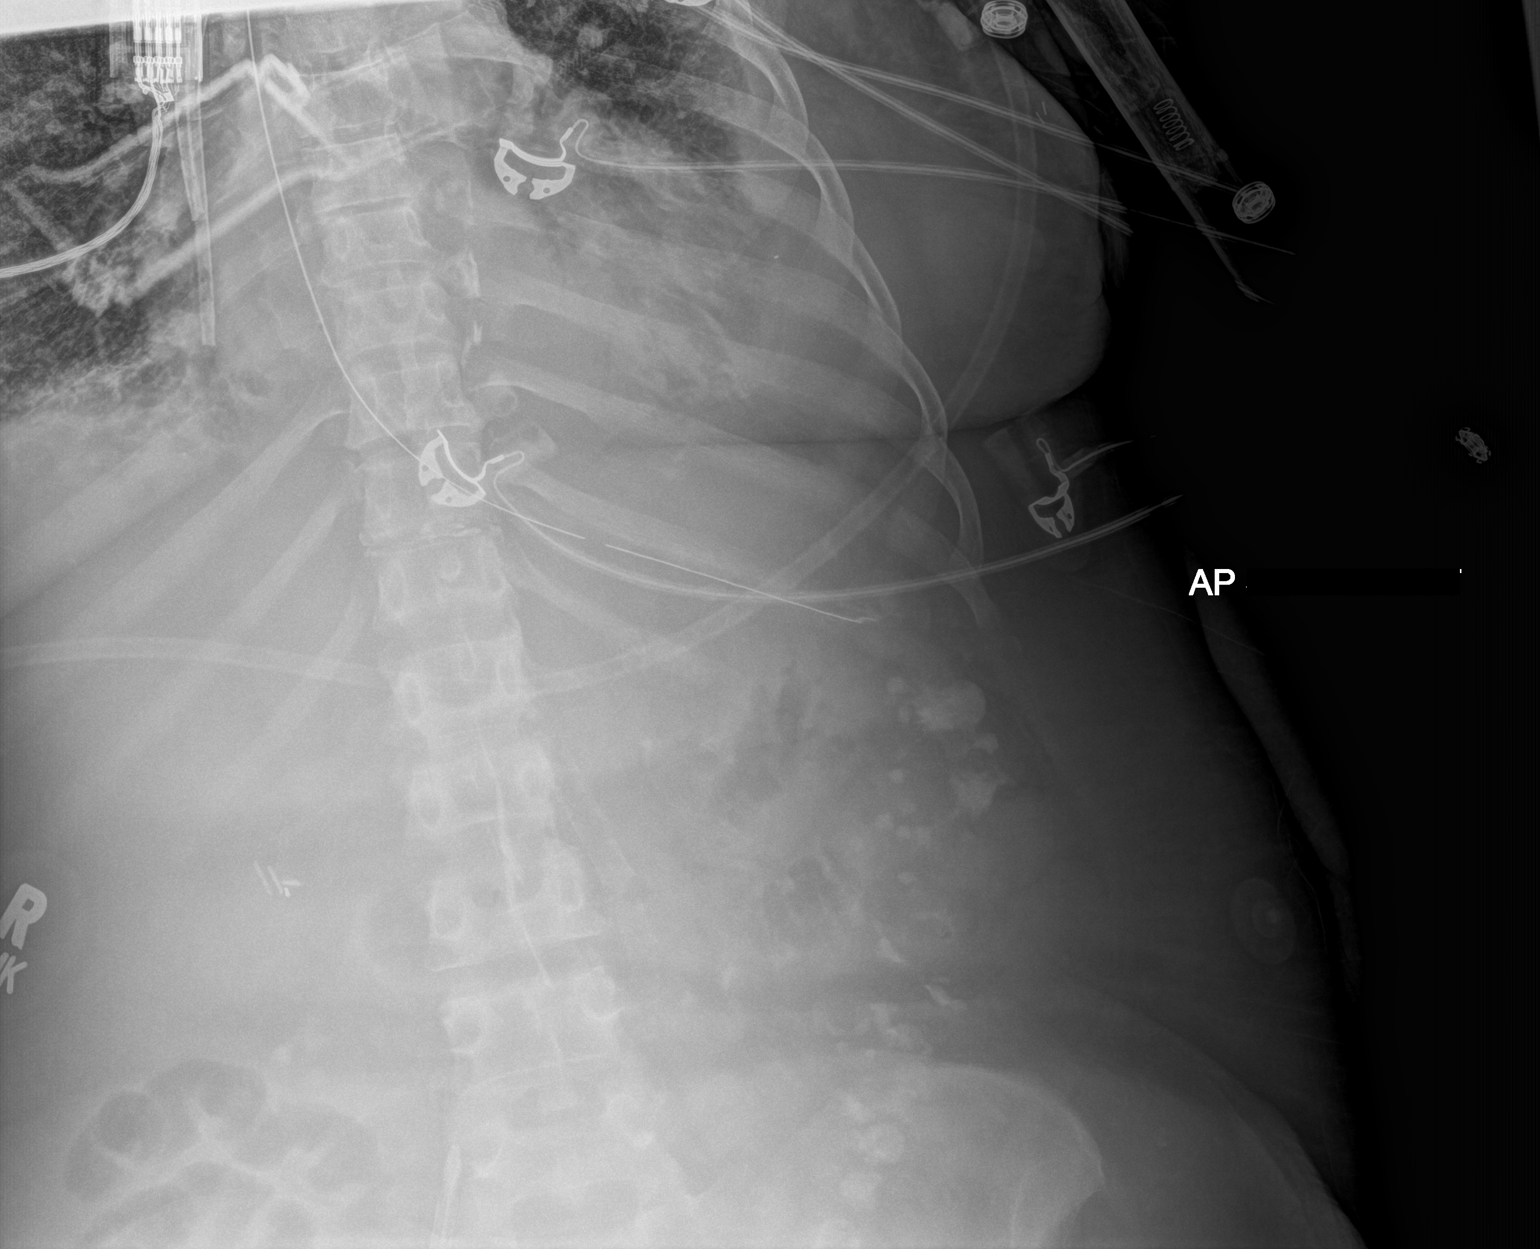

[1 of 1 positions shown; findings below may reference images not displayed]

FINDINGS: Scattered large and small bowel gas is noted. Contrast material is
noted within the colon. Gastric catheter is noted extending into the
stomach. Changes of prior cholecystectomy are seen.
IMPRESSION: Gastric catheter within the stomach.

## 2021-09-03 IMAGING — US US ABDOMEN LIMITED
1 series · 14 of 25 positions shown · non-contrast
Comparison: CT 04/25/2021

CLINICAL DATA: Right upper quadrant pain

EXAM:
ULTRASOUND ABDOMEN LIMITED RIGHT UPPER QUADRANT

[Series 1: us abdomen limited ruq (liver/gb) · 14 of 34 slices shown]
[im 1/34]
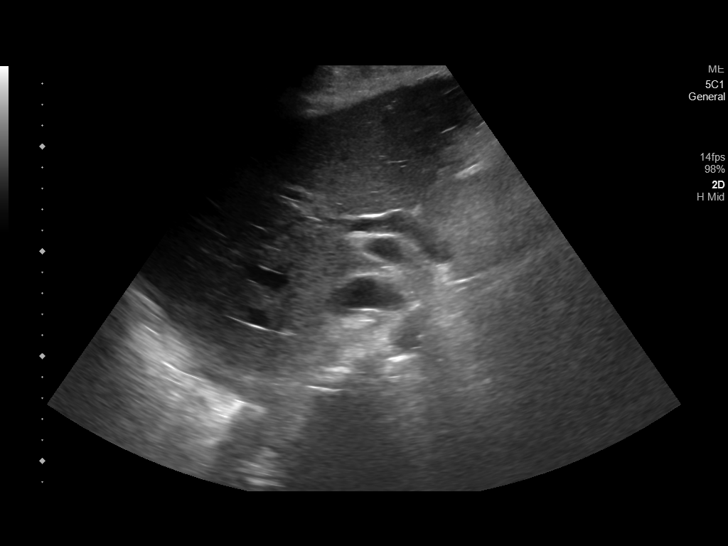
[im 3/34]
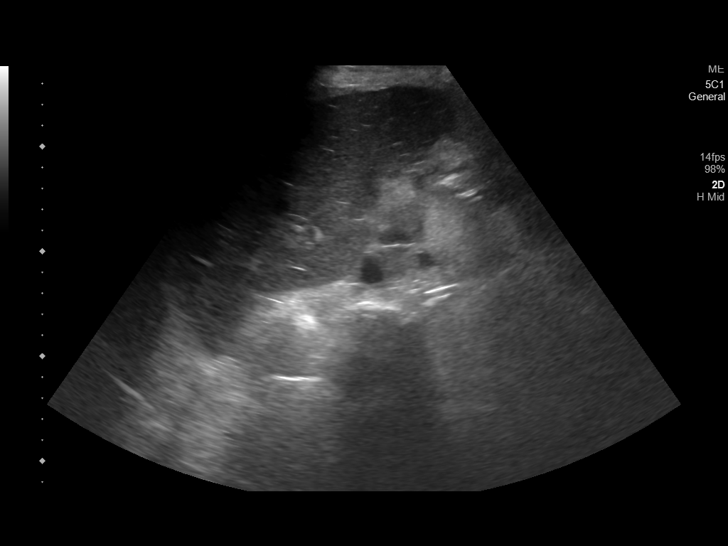
[im 6/34]
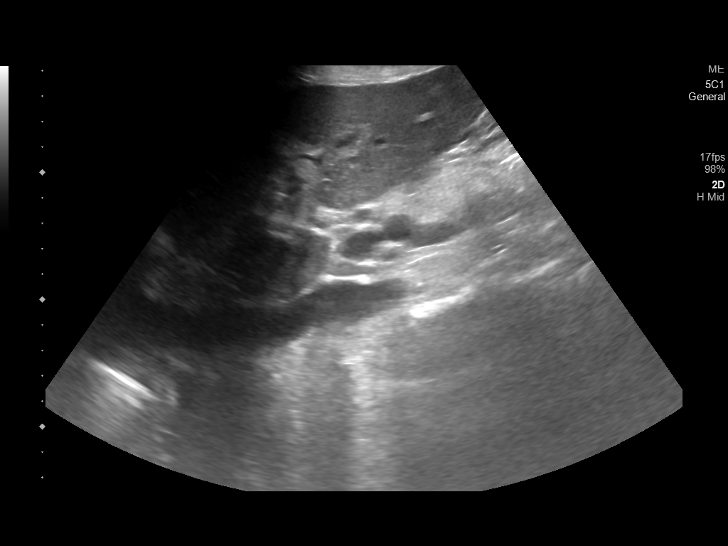
[im 9/34]
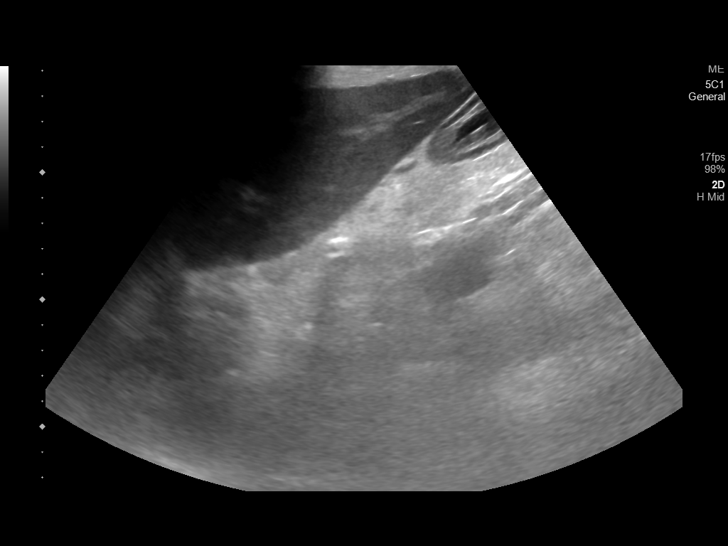
[im 12/34]
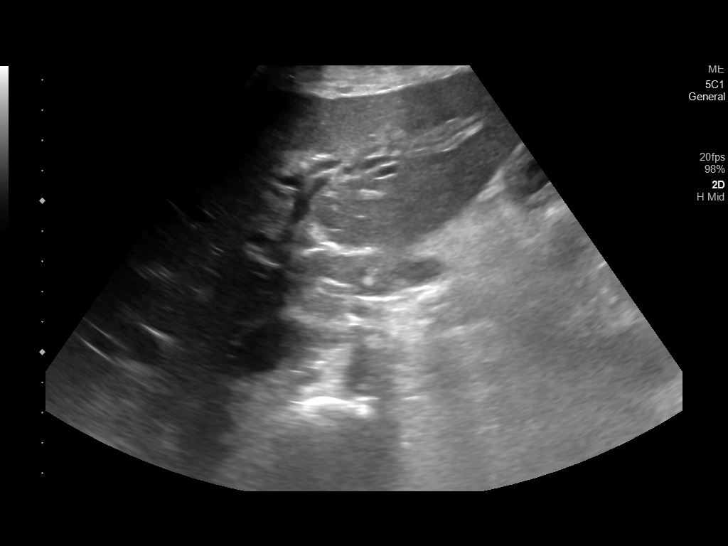
[im 13/34]
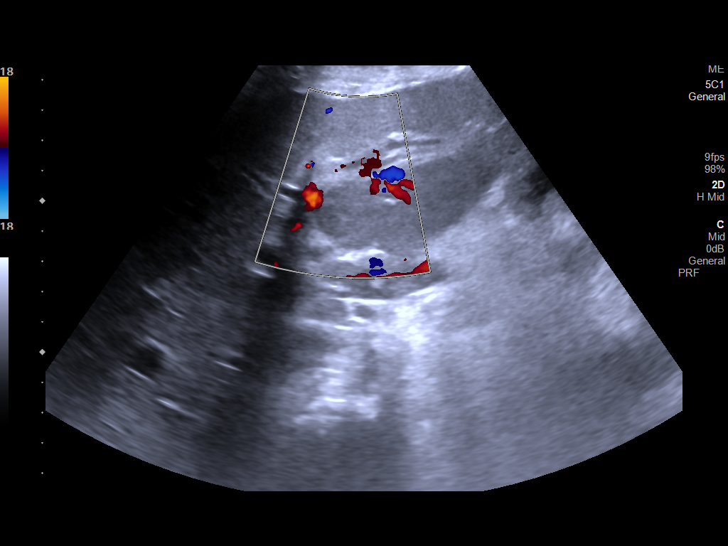
[im 16/34]
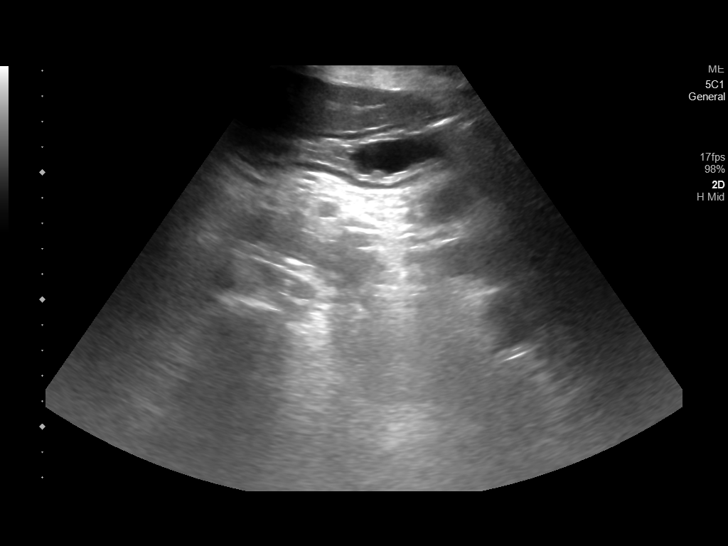
[im 18/34]
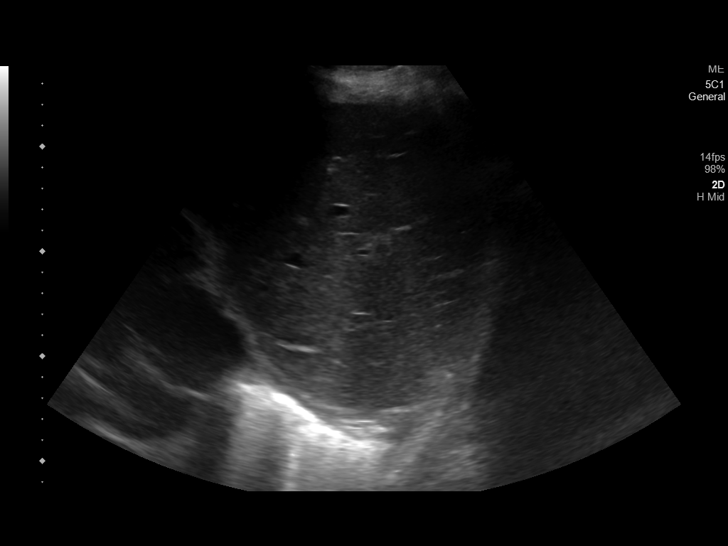
[im 21/34]
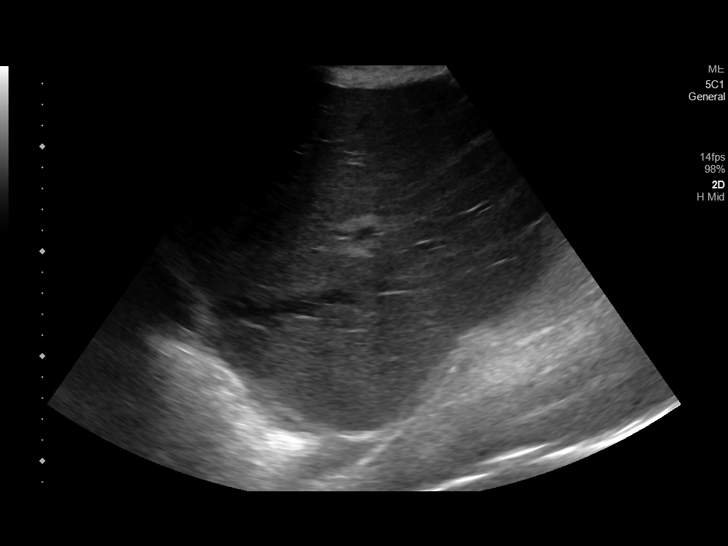
[im 23/34]
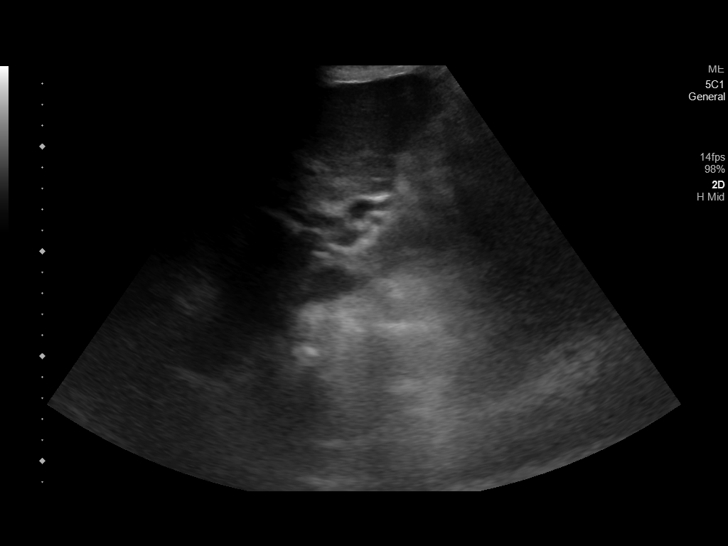
[im 25/34]
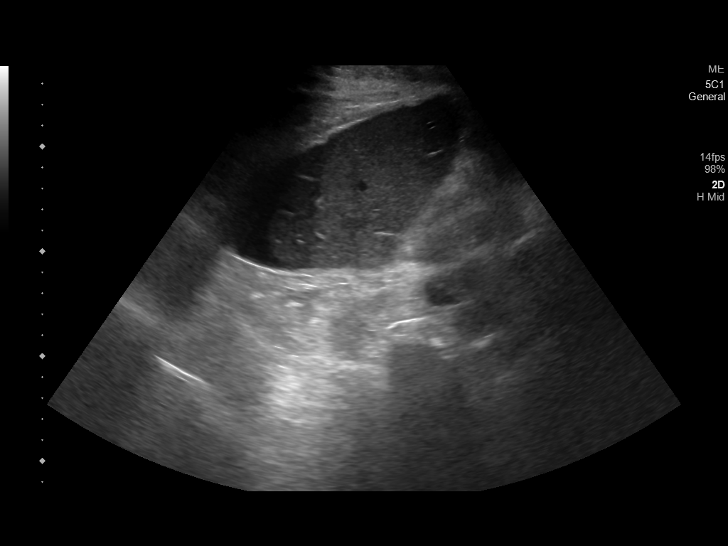
[im 28/34]
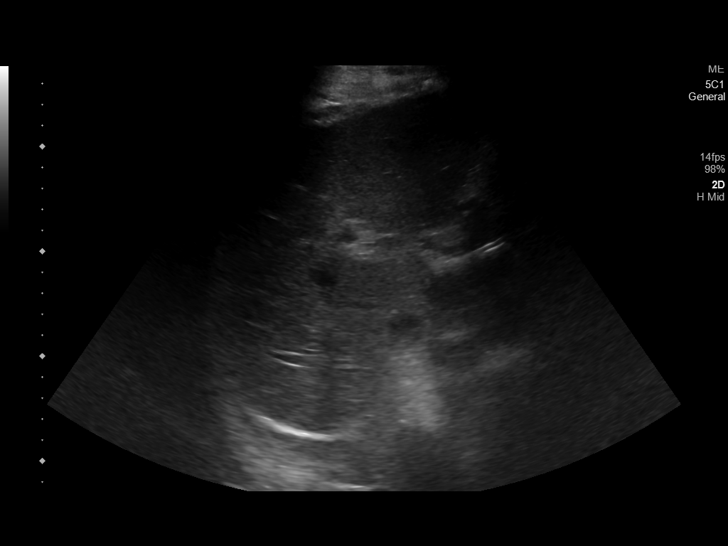
[im 31/34]
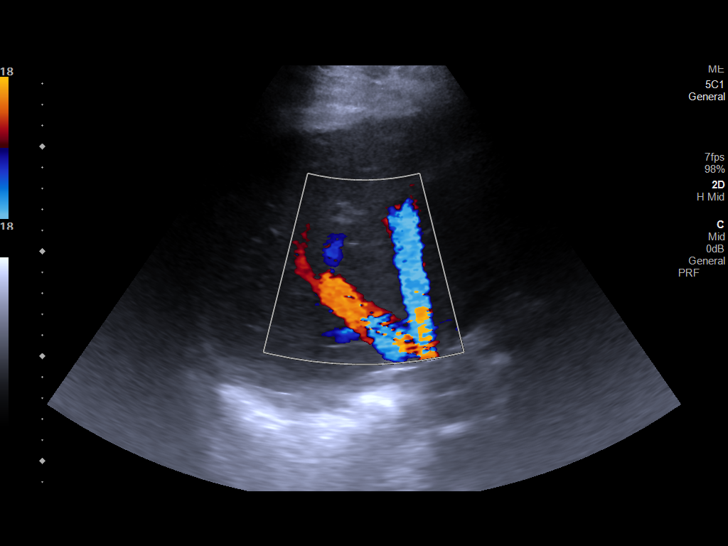
[im 34/34]
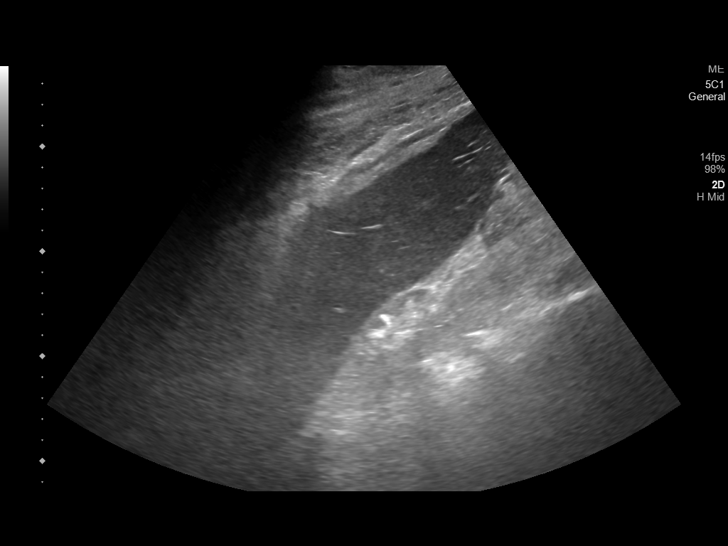

[14 of 25 positions shown; findings below may reference images not displayed]

FINDINGS: Gallbladder:

Surgically absent

Common bile duct:

Diameter: 5.9 mm

Liver:

Coarse hepatic echotexture with possible contour nodularity. No
focal hepatic abnormality. Portal vein is patent on color Doppler
imaging with normal direction of blood flow towards the liver.

Other: Small amount of perihepatic ascites.  Atrophic right kidney.
IMPRESSION: 1. Status post cholecystectomy.  No biliary dilatation
2. Coarse hepatic echotexture with questionable nodularity
suggesting underlying hepatic parenchymal disease and potential
early cirrhosis changes. Small amount of perihepatic ascites.

## 2021-09-04 IMAGING — CT CT HEAD W/O CM
3 series · 16 of 47 positions shown, 19 images · non-contrast
Comparison: CT head without contrast 03/29/2021

CLINICAL DATA: Delirium.  Sepsis.

EXAM:
CT HEAD WITHOUT CONTRAST
TECHNIQUE: Contiguous axial images were obtained from the base of the skull
through the vertex without intravenous contrast.

[Series 2: head wo · axial · 0.42mm/px · z∈[+373,+523]mm · 10 of 36 slices shown, 13 images]
[im 3/36  brain]
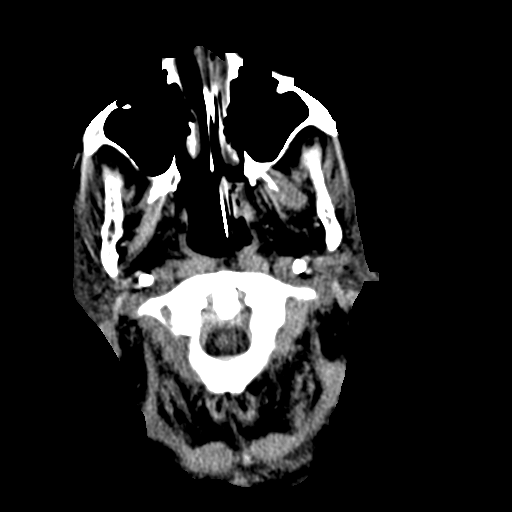
[im 3/36  bone]
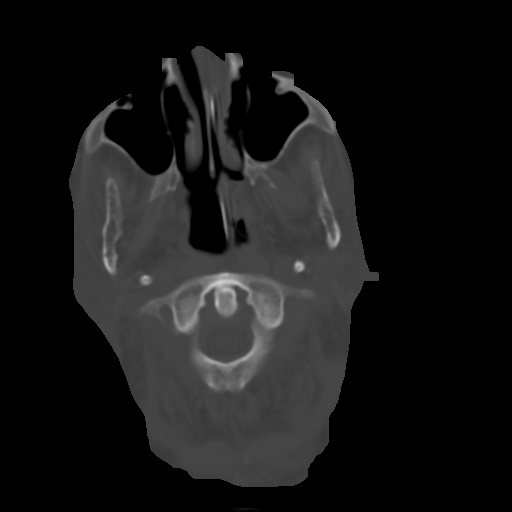
[im 7/36  brain]
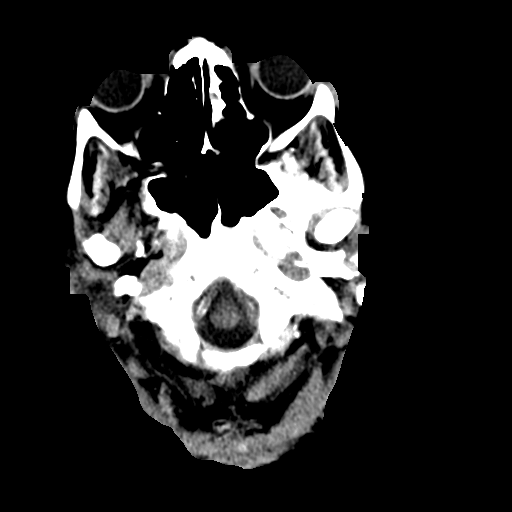
[im 10/36  brain]
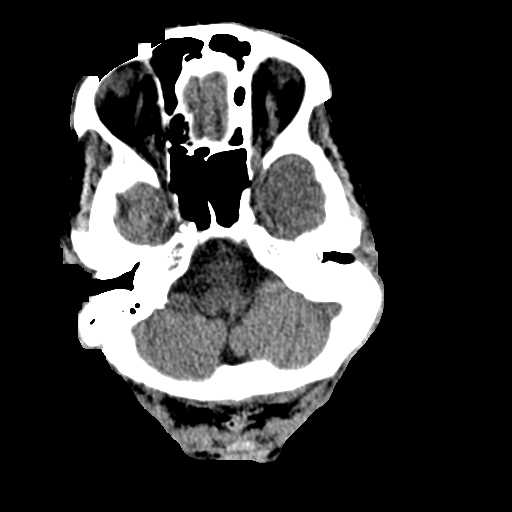
[im 13/36  brain]
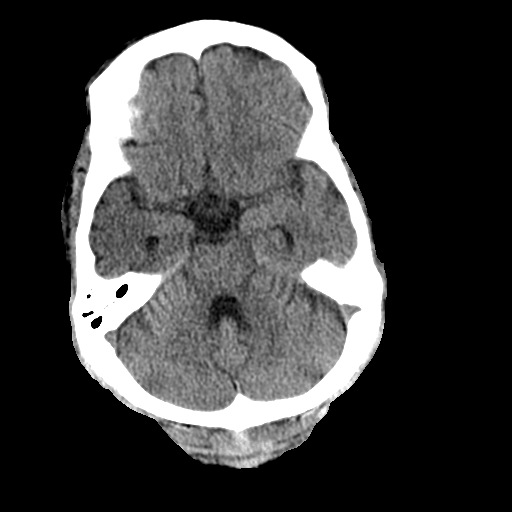
[im 16/36  brain]
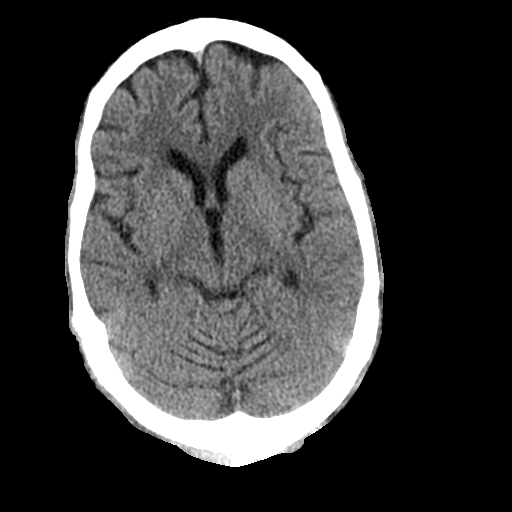
[im 16/36  bone]
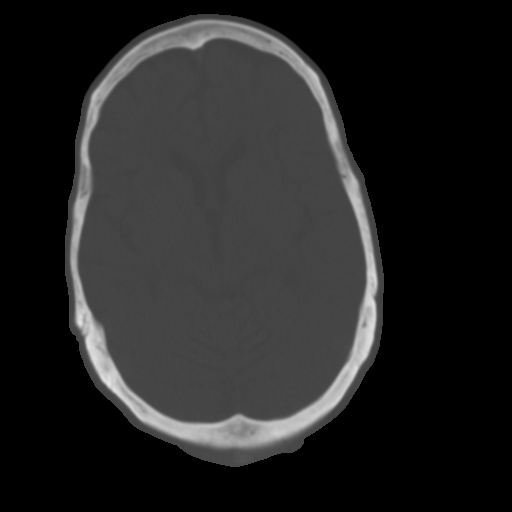
[im 20/36  brain]
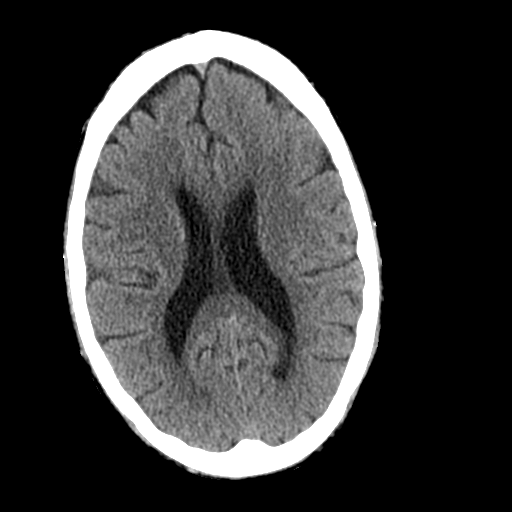
[im 23/36  brain]
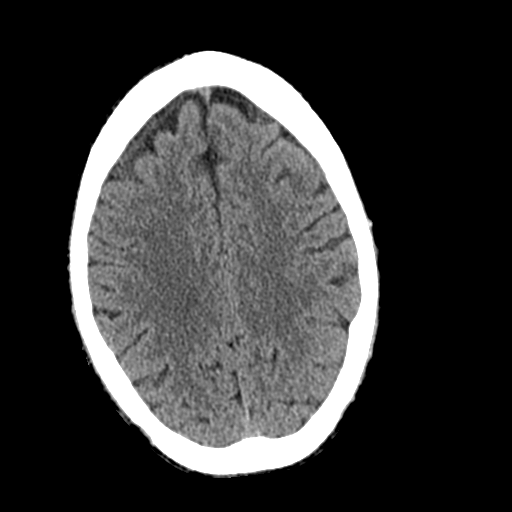
[im 27/36  brain]
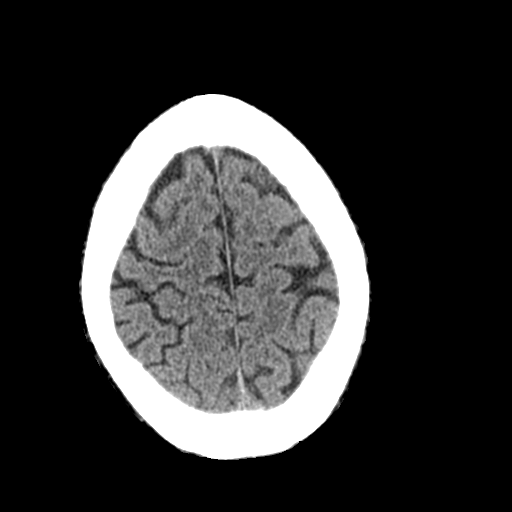
[im 29/36  brain]
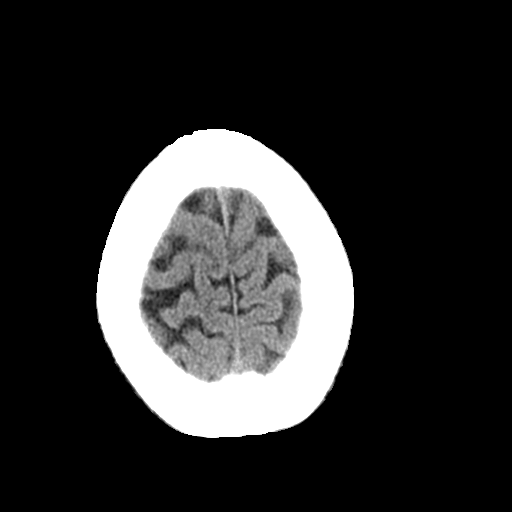
[im 29/36  bone]
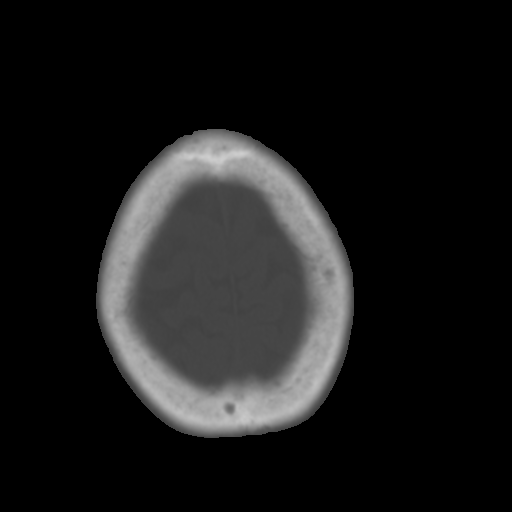
[im 33/36  brain]
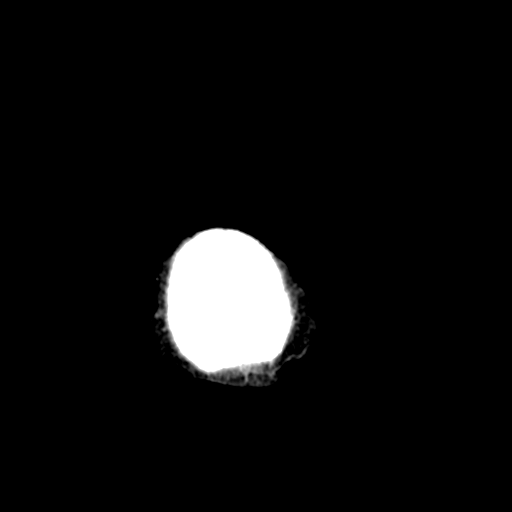

[Series 4: coronal soft tissue · coronal · 0.33mm/px · 3 of 83 slices shown]
[im 28/83  brain]
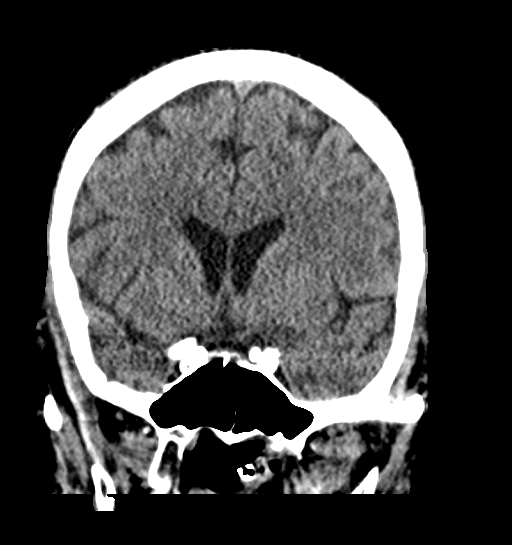
[im 37/83  brain]
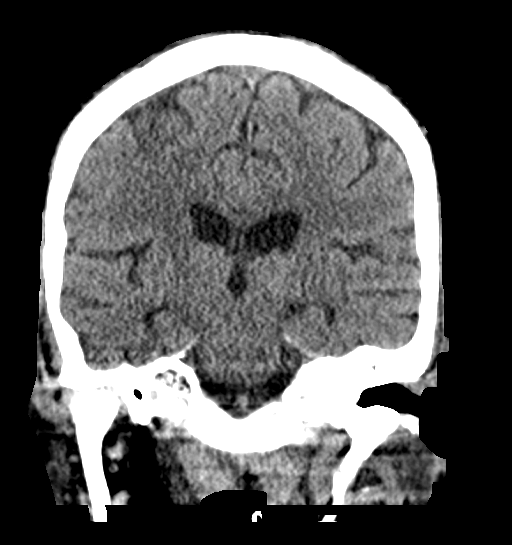
[im 46/83  brain]
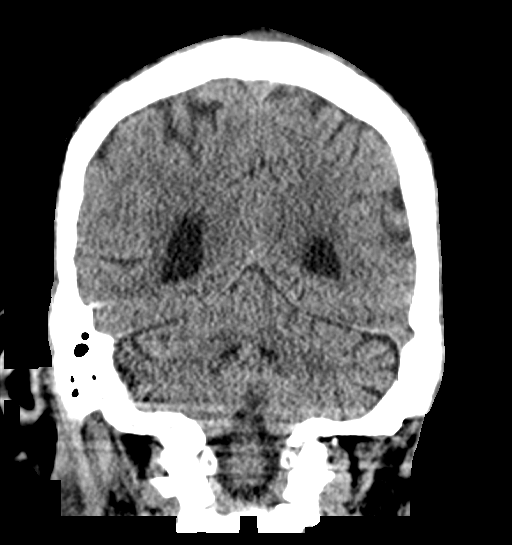

[Series 5: sagittal soft tissue · sagittal · 0.38mm/px · 3 of 84 slices shown]
[im 28/84  brain]
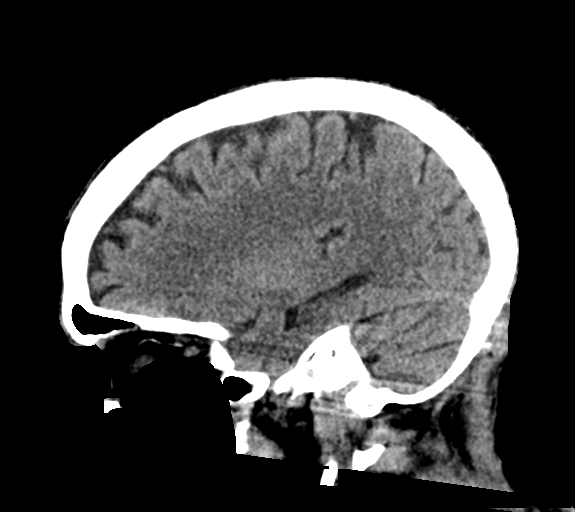
[im 42/84  brain]
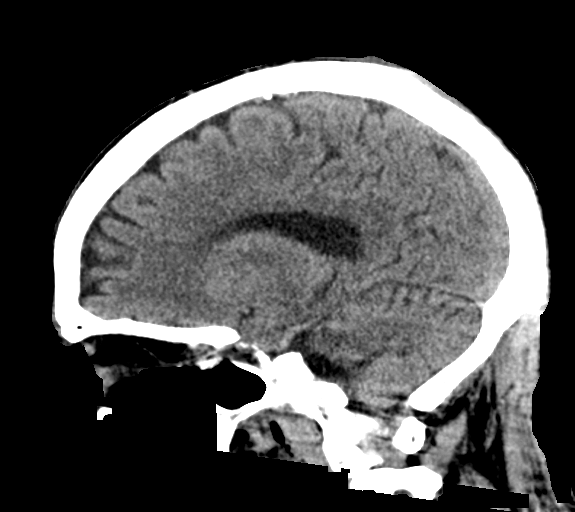
[im 56/84  brain]
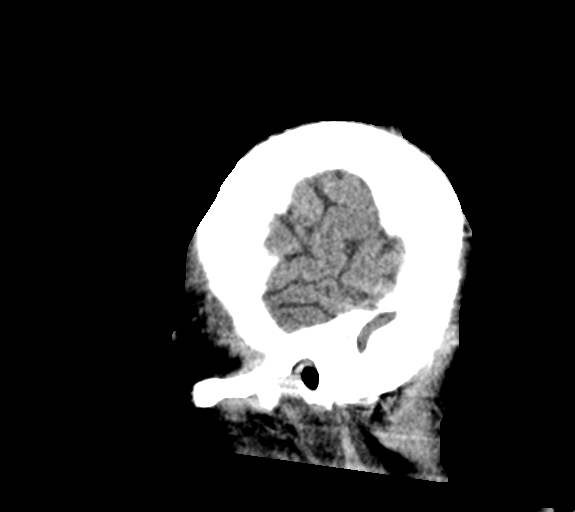

[16 of 47 positions shown; findings below may reference images not displayed]

FINDINGS: Brain: No acute infarct, hemorrhage, or mass lesion is present. The
ventricles are of normal size. No significant extraaxial fluid
collection is present.

Vascular: Atherosclerotic calcifications are present in the
cavernous internal carotid arteries bilaterally. No hyperdense
vessel is present.

Skull: Calvarium is intact. No focal lytic or blastic lesions are
present. No significant extracranial soft tissue lesion is present.

Sinuses/Orbits: Small fluid level is present in the left sphenoid
sinus. Asymmetric fluid is present in the left nasal cavity
associated with the NG tube. Sinuses are otherwise clear. Fluid is
present in the left mastoid air cells. No obstructing nasopharyngeal
lesion is present. Bilateral lens replacements are noted. Globes and
orbits are otherwise unremarkable.

Other: Healed
IMPRESSION: 1. Normal CT appearance of the brain.
2. Small fluid level in the left sphenoid sinus.
3. Fluid in the left mastoid air cells and left nasal cavity
associated with the NG tube. No obstructing nasopharyngeal lesion is
present.

## 2021-09-04 IMAGING — DX DG ABDOMEN 1V
1 series · 1 of 1 positions shown · non-contrast
Comparison: Abdominal x-ray dated April 26, 2021.

CLINICAL DATA: Enteric tube placement.

EXAM:
ABDOMEN - 1 VIEW

[abdomen supine]
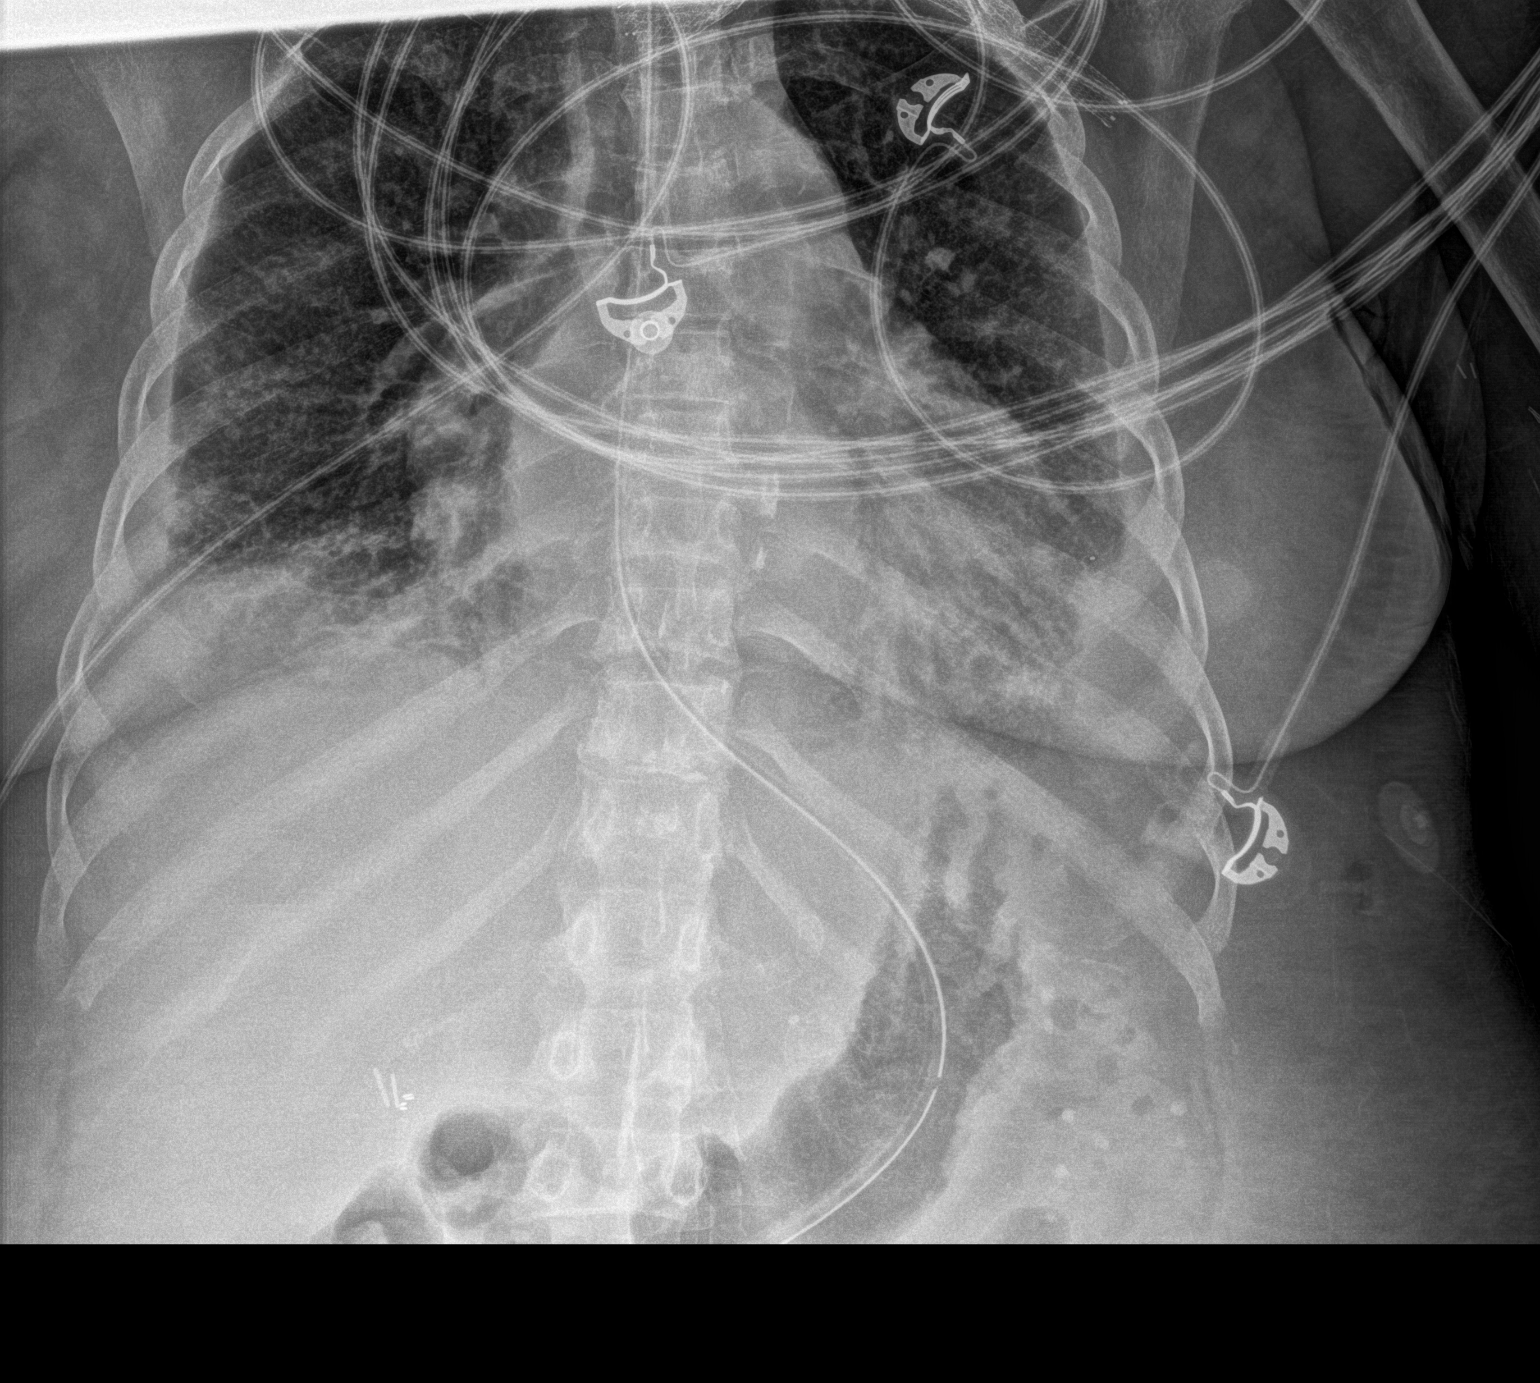

[1 of 1 positions shown; findings below may reference images not displayed]

FINDINGS: Enteric tube in the stomach with tip near the distal body. The bowel
gas pattern is normal. Small bilateral pleural effusions.
IMPRESSION: 1. Enteric tube in the stomach.

## 2021-09-05 ENCOUNTER — Other Ambulatory Visit: Payer: Medicare Other

## 2021-09-05 ENCOUNTER — Ambulatory Visit: Payer: Medicare Other | Admitting: Nurse Practitioner

## 2021-09-19 ENCOUNTER — Other Ambulatory Visit: Payer: Self-pay

## 2021-09-19 ENCOUNTER — Inpatient Hospital Stay: Payer: Medicare Other | Attending: Nurse Practitioner | Admitting: Nurse Practitioner

## 2021-09-19 ENCOUNTER — Encounter: Payer: Self-pay | Admitting: Nurse Practitioner

## 2021-09-19 VITALS — BP 160/68 | HR 78 | Temp 96.8°F | Resp 16 | Wt 157.4 lb

## 2021-09-19 DIAGNOSIS — N2889 Other specified disorders of kidney and ureter: Secondary | ICD-10-CM | POA: Insufficient documentation

## 2021-09-19 DIAGNOSIS — Z90710 Acquired absence of both cervix and uterus: Secondary | ICD-10-CM | POA: Insufficient documentation

## 2021-09-19 DIAGNOSIS — N186 End stage renal disease: Secondary | ICD-10-CM | POA: Insufficient documentation

## 2021-09-19 DIAGNOSIS — I132 Hypertensive heart and chronic kidney disease with heart failure and with stage 5 chronic kidney disease, or end stage renal disease: Secondary | ICD-10-CM | POA: Diagnosis not present

## 2021-09-19 DIAGNOSIS — Z809 Family history of malignant neoplasm, unspecified: Secondary | ICD-10-CM | POA: Insufficient documentation

## 2021-09-19 DIAGNOSIS — I5032 Chronic diastolic (congestive) heart failure: Secondary | ICD-10-CM | POA: Insufficient documentation

## 2021-09-19 DIAGNOSIS — D472 Monoclonal gammopathy: Secondary | ICD-10-CM | POA: Diagnosis not present

## 2021-09-19 DIAGNOSIS — E1122 Type 2 diabetes mellitus with diabetic chronic kidney disease: Secondary | ICD-10-CM | POA: Insufficient documentation

## 2021-09-19 DIAGNOSIS — D631 Anemia in chronic kidney disease: Secondary | ICD-10-CM | POA: Insufficient documentation

## 2021-09-19 NOTE — Progress Notes (Signed)
Hematology/Oncology Consult Note [facility]  Telephone:([phone] [phone]  Patient Care Team: Denton Lank, MD as PCP - General (Family Medicine) Corey Skains, MD as PCP - Cardiology (Cardiology) Lavonia Dana, MD as Consulting Physician (Internal Medicine) Freddy Finner, NP (Nurse Practitioner) Jason Coop, NP as Nurse Practitioner Sindy Guadeloupe, MD as Consulting Physician (Oncology) Anthonette Legato, MD as Consulting Physician (Nephrology)   Name of the patient: [name]  [id number]  May 11, 1961   Date of visit: 09/19/21  Diagnosis- anemia of chronic kidney disease MGUS  Chief complaint/ Reason for visit- routine f/u of anemia of CKD  Heme/Onc history: patient is a 60 year-old female with a past medical history significant for coronary artery disease, type 2 diabetes with neuropathy, hypertension hyperlipidemia and osteopenia arthritis. She has ESRD secondary to HTN/DM and has been on dialysis for about 14 years. She was seen by rheumatology for complaints of low back pain. She also had painful subcutaneous skin rashes and was seen by dermatology and biopsy of one of the lesions showed mild septal panicky like this. As a part of workup by rheumatology doctor Dr. Lezlie Lye- Meda Coffee she had SPEP done which came back abnormal with the presence of M spike and hence has been referred to [place]. There was still some ongoing concern about possible lupus and she follows up with rheumatology. Her last colonoscopy was in November 2017 which showed tubular adenoma and a repeat colonoscopy was recommended in 5 years.   CBC from 01/08/2017 showed white count of 6.7, H&H of 13.9/42.8 and a platelet count of 161. CMP showed elevated BUN of 43 and creatinine of 5.22. Calcium was normal at 9.7. Total protein was mildly elevated at 8.6. An albumin was normal at 3.9. SPEP from 11/06/2016 did not reveal any M spike. M spike of 4.2% was noted on 24-hour urine  protein. A lambda free light chain ratio was normal although both And lambda were elevated due to chronic kidney disease. she gets epo through dialysis. She also has kidney mass suspicious for RCC being followed by Dr. Erlene Quan   Interval history- patient is 60 year old female who returns to clinic for follow-up.  She continues hemodialysis.  Has not had labs today due to poor access.  She has a history of multiple failed accesses.  She continues to feel at baseline.  She is worried about her kidney cancer.  Has not seen Dr. Erlene Quan since June 2021 and canceled her follow-up appointments.  Has not had recommended repeat MRI.  ECOG PS- 2 Pain scale- 0   Review of systems- Review of Systems  Constitutional:  Positive for malaise/fatigue. Negative for chills, fever and weight loss.  HENT:  Negative for congestion, ear discharge and nosebleeds.   Eyes:  Negative for blurred vision.  Respiratory:  Negative for cough, hemoptysis, sputum production, shortness of breath and wheezing.   Cardiovascular:  Negative for chest pain, palpitations, orthopnea and claudication.  Gastrointestinal:  Negative for abdominal pain, blood in stool, constipation, diarrhea, heartburn, melena, nausea and vomiting.  Genitourinary:  Negative for dysuria, flank pain, frequency, hematuria and urgency.  Musculoskeletal:  Negative for back pain, joint pain and myalgias.  Skin:  Negative for rash.  Neurological:  Negative for dizziness, tingling, focal weakness, seizures, weakness and headaches.  Endo/Heme/Allergies:  Does not bruise/bleed easily.  Psychiatric/Behavioral:  Negative for depression and suicidal ideas. The patient has insomnia.      Allergies  Allergen Reactions   Ace Inhibitors Swelling and Anaphylaxis  Ativan [Lorazepam] Other (See Comments)    Reaction:  Hallucinations and headaches   Compazine [Prochlorperazine Edisylate] Anaphylaxis, Nausea And Vomiting and Other (See Comments)    Other reaction(s):  dystonia from this vs. Reglan, 23 Jul - patient relates that she takes promethazine frequently with no problems   Sumatriptan Succinate Other (See Comments)    Other reaction(s): delirium and hallucinations per [facility] records   Zofran [Ondansetron] Nausea And Vomiting    Per pt. she is allergic to zofran or will experience adverse reaction like hallucination    Losartan Nausea Only   Prochlorperazine Other (See Comments)    Reaction:  Unknown . Patient does not remember reaction but she does have vertigo and anxiety along with n and v at times. Could be used to treat any of these    Reglan [Metoclopramide] Other (See Comments)    Per patient her Dr. Evelina Bucy her off it    Scopolamine Other (See Comments)    Dizziness, also has vertigo already   Tape Rash    Plastic tape causes rash   Tapentadol Rash   Ultrasound Gel Itching    Patient states the Ultrasound gel caused itching while in skin contact and resolved when wiped away.     Past Medical History:  Diagnosis Date   Anemia    Anginal pain (HCC)    Anxiety    Arthritis    Asthma    Broken wrist    Bronchitis    chronic diastolic CHF 6/46/8032   Chronic kidney disease    COPD (chronic obstructive pulmonary disease) ([place])    Coronary artery disease    a. cath 2013: stenting to RCA (report not available); b. cath 2014: LM nl, pLAD 40%, mLAD nl, ost LCx 40%, mid LCx nl, pRCA 30% @ site of prior stent, mRCA 50%   Depression    Diabetes mellitus ([place])    Diabetes mellitus without complication ([place])    Diabetic neuropathy ([place])    dialysis 2006   Diverticulosis    Dizziness    Dyspnea    Elevated lipids    Environmental and seasonal allergies    ESRD (end stage renal disease) on dialysis ([place])    M-W-F   Gastroparesis    GERD (gastroesophageal reflux disease)    Headache    History of anemia due to chronic kidney disease    History of hiatal hernia    HOH (hard of hearing)    Hx of pancreatitis 2015   Hypertension    Lower  extremity edema    Mitral regurgitation    a. echo 10/2013: EF 62%, noWMA, mildly dilated LA, mild to mod MR/TR, GR1DD   Myocardial infarction ([place])    Orthopnea    Parathyroid abnormality ([place])    Peripheral arterial disease ([place])    Pneumonia    Renal cancer ([place])    Renal insufficiency    Pt is on dialysis on M,W + F.   Wheezing      Past Surgical History:  Procedure Laterality Date   A/V SHUNTOGRAM Left 01/20/2018   Procedure: A/V SHUNTOGRAM;  Surgeon: Algernon Huxley, MD;  Location: [place] [facility];  Service: Cardiovascular;  Laterality: Left;   ABDOMINAL HYSTERECTOMY  1992   AMPUTATION TOE Left 10/02/2017   Procedure: AMPUTATION TOE-LEFT GREAT TOE;  Surgeon: Albertine Patricia, DPM;  Location: [facility];  Service: Podiatry;  Laterality: Left;   APPENDECTOMY     APPLICATION OF WOUND VAC N/A 11/25/2019   Procedure:  APPLICATION OF WOUND VAC;  Surgeon: Katha Cabal, MD;  Location: [facility];  Service: Vascular;  Laterality: N/A;   ARTERY BIOPSY Right 10/11/2018   Procedure: BIOPSY TEMPORAL ARTERY;  Surgeon: Vickie Epley, MD;  Location: [facility];  Service: General;  Laterality: Right;   CARDIAC CATHETERIZATION Left 07/26/2015   Procedure: Left Heart Cath and Coronary Angiography;  Surgeon: Dionisio David, MD;  Location: [place] [facility];  Service: Cardiovascular;  Laterality: Left;   CATARACT EXTRACTION W/ INTRAOCULAR LENS IMPLANT Right    CATARACT EXTRACTION W/PHACO Left 03/10/2017   Procedure: CATARACT EXTRACTION PHACO AND INTRAOCULAR LENS PLACEMENT ([place]);  Surgeon: Birder Robson, MD;  Location: [facility];  Service: Ophthalmology;  Laterality: Left;  [place] 00:51.9 AP% 14.2 CDE 7.39 fluid pack lot # [id number]   CENTRAL LINE INSERTION Right 11/11/2019   Procedure: CENTRAL LINE INSERTION;  Surgeon: Katha Cabal, MD;  Location: [facility];  Service: Vascular;  Laterality: Right;   CENTRAL LINE INSERTION  11/25/2019   Procedure: CENTRAL LINE INSERTION;  Surgeon: Katha Cabal, MD;  Location: [facility];  Service: Vascular;;   CHOLECYSTECTOMY     COLONOSCOPY WITH PROPOFOL N/A 08/12/2016   Procedure: COLONOSCOPY WITH PROPOFOL;  Surgeon: Lollie Sails, MD;  Location: [facility] ENDOSCOPY;  Service: Endoscopy;  Laterality: N/A;   DIALYSIS FISTULA CREATION Left    upper arm   dialysis grafts     DIALYSIS/PERMA CATHETER INSERTION N/A 11/14/2019   Procedure: DIALYSIS/PERMA CATHETER INSERTION;  Surgeon: Algernon Huxley, MD;  Location: [place] [facility];  Service: Cardiovascular;  Laterality: N/A;   DIALYSIS/PERMA CATHETER INSERTION N/A 02/03/2020   Procedure: DIALYSIS/PERMA CATHETER INSERTION;  Surgeon: Katha Cabal, MD;  Location: [place] [facility];  Service: Cardiovascular;  Laterality: N/A;   DIALYSIS/PERMA CATHETER INSERTION N/A 06/19/2020   Procedure: DIALYSIS/PERMA CATHETER INSERTION;  Surgeon: Katha Cabal, MD;  Location: [place] [facility];  Service: Cardiovascular;  Laterality: N/A;   DIALYSIS/PERMA CATHETER INSERTION N/A 05/07/2021   Procedure: DIALYSIS/PERMA CATHETER INSERTION;  Surgeon: Katha Cabal, MD;  Location: [place] [facility];  Service: Cardiovascular;  Laterality: N/A;  Patient needs general anethesia   DIALYSIS/PERMA CATHETER REMOVAL N/A 05/25/2020   Procedure: DIALYSIS/PERMA CATHETER REMOVAL;  Surgeon: Katha Cabal, MD;  Location: [place] [facility];  Service: Cardiovascular;  Laterality: N/A;   ESOPHAGOGASTRODUODENOSCOPY N/A 03/08/2015   Procedure: ESOPHAGOGASTRODUODENOSCOPY (EGD);  Surgeon: Manya Silvas, MD;  Location: [facility] ENDOSCOPY;  Service: Endoscopy;  Laterality: N/A;   ESOPHAGOGASTRODUODENOSCOPY (EGD) WITH PROPOFOL N/A 03/18/2016   Procedure: ESOPHAGOGASTRODUODENOSCOPY (EGD) WITH PROPOFOL;  Surgeon: [name], MD;  Location: [facility] ENDOSCOPY;  Service: Endoscopy;  Laterality: N/A;   EYE SURGERY Right 2018   FECAL TRANSPLANT N/A 08/23/2015   Procedure: FECAL TRANSPLANT;  Surgeon: Manya Silvas, MD;   Location[facility]COPY;  Service: Endoscopy;  Laterality: N/A;   HAND SURGERY Bilateral    HEMATOMA EVACUATION Left 11/25/2019   Procedure: EVACUATION HEMATOMA;  Surgeon: Katha Cabal, MD;  Location: [facility];  Service: Vascular;  Laterality: Left;   I & D EXTREMITY Left 11/25/2019   Procedure: IRRIGATION AND DEBRIDEMENT EXTREMITY;  Surgeon: Katha Cabal, MD;  Location: [facility];  Service: Vascular;  Laterality: Left;   IR FLUORO GUIDE CV LINE RIGHT  04/06/2020   IR INJECT/THERA/INC NEEDLE/CATH/PLC EPI/CERV/THOR W/IMG  08/13/2020   IR RADIOLOGIST EVAL & MGMT  07/28/2019   IR RADIOLOGIST EVAL & MGMT  08/11/2019   LIGATION OF ARTERIOVENOUS  FISTULA Left 11/11/2019   LIGATION OF ARTERIOVENOUS  FISTULA Left 11/11/2019   Procedure: LIGATION OF ARTERIOVENOUS  FISTULA;  Surgeon: Katha Cabal, MD;  Location: [facility];  Service: Vascular;  Laterality: Left;   LIGATIONS OF HERO GRAFT Right 06/13/2020   Procedure: LIGATION / REMOVAL OF RIGHT HERO GRAFT;  Surgeon: Katha Cabal, MD;  Location: [facility];  Service: Vascular;  Laterality: Right;   PERIPHERAL VASCULAR CATHETERIZATION N/A 12/20/2015   Procedure: Thrombectomy of dialysis access versus permcath placement;  Surgeon: Algernon Huxley, MD;  Location: [place] [facility];  Service: Cardiovascular;  Laterality: N/A;   PERIPHERAL VASCULAR CATHETERIZATION N/A 12/20/2015   Procedure: A/V Shunt Intervention;  Surgeon: Algernon Huxley, MD;  Location: [place] [facility];  Service: Cardiovascular;  Laterality: N/A;   PERIPHERAL VASCULAR CATHETERIZATION N/A 12/20/2015   Procedure: A/V Shuntogram/Fistulagram;  Surgeon: Algernon Huxley, MD;  Location: [place] [facility];  Service: Cardiovascular;  Laterality: N/A;   PERIPHERAL VASCULAR CATHETERIZATION N/A 01/02/2016   Procedure: A/V Shuntogram/Fistulagram;  Surgeon: Algernon Huxley, MD;  Location: [place] [facility];  Service: Cardiovascular;  Laterality: N/A;   PERIPHERAL VASCULAR CATHETERIZATION N/A  01/02/2016   Procedure: A/V Shunt Intervention;  Surgeon: Algernon Huxley, MD;  Location: [place] [facility];  Service: Cardiovascular;  Laterality: N/A;   TEE WITHOUT CARDIOVERSION N/A 06/11/2020   Procedure: TRANSESOPHAGEAL ECHOCARDIOGRAM (TEE);  Surgeon: Teodoro Spray, MD;  Location: [facility];  Service: Cardiovascular;  Laterality: N/A;   UPPER EXTREMITY ANGIOGRAPHY Bilateral 11/27/2020   Procedure: UPPER EXTREMITY ANGIOGRAPHY;  Surgeon: Katha Cabal, MD;  Location: [place] [facility];  Service: Cardiovascular;  Laterality: Bilateral;   UPPER EXTREMITY VENOGRAPHY Right 01/18/2020   Procedure: UPPER EXTREMITY VENOGRAPHY;  Surgeon: Katha Cabal, MD;  Location: [place] [facility];  Service: Cardiovascular;  Laterality: Right;   UPPER EXTREMITY VENOGRAPHY Bilateral 11/27/2020   Procedure: UPPER EXTREMITY VENOGRAPHY;  Surgeon: Katha Cabal, MD;  Location: [place] [facility];  Service: Cardiovascular;  Laterality: Bilateral;   VASCULAR ACCESS DEVICE INSERTION Right 04/13/2020   Procedure: INSERTION OF HERO VASCULAR ACCESS DEVICE (GRAFT);  Surgeon: Katha Cabal, MD;  Location: [facility];  Service: Vascular;  Laterality: Right;    Social History   Socioeconomic History   Marital status: Divorced    Spouse name: Not on file   Number of children: Not on file   Years of education: Not on file   Highest education level: Not on file  Occupational History   Not on file  Tobacco Use   Smoking status: Never   Smokeless tobacco: Never  Vaping Use   Vaping Use: Never used  Substance and Sexual Activity   Alcohol use: Not Currently    Comment: glass wine week per pt   Drug use: Yes    Types: Marijuana    Comment: once a day   Sexual activity: Never  Other Topics Concern   Not on file  Social History Narrative   ** Merged History Encounter **       Social Determinants of Health   Financial Resource Strain: Not on file  Food Insecurity: Not on file  Transportation  Needs: Not on file  Physical Activity: Not on file  Stress: Not on file  Social Connections: Not on file  Intimate Partner Violence: Not on file    Family History  Problem Relation Age of Onset   Kidney disease Mother    Diabetes Mother  Cancer Father    Kidney disease Sister      Current Outpatient Medications:    albuterol (PROVENTIL) (2.5 MG/3ML) 0.083% nebulizer solution, Take 2.5 mg by nebulization every 4 (four) hours as needed for wheezing or shortness of breath., Disp: , Rfl:    albuterol (VENTOLIN HFA) 108 (90 Base) MCG/ACT inhaler, Inhale 2 puffs into the lungs every 4 (four) hours as needed for wheezing or shortness of breath., Disp: , Rfl:    alum & mag hydroxide-simeth (MAALOX/MYLANTA) 200-200-20 MG/5ML suspension, Take 15 mLs by mouth every 6 (six) hours as needed for indigestion or heartburn., Disp: 355 mL, Rfl: 0   amLODipine (NORVASC) 10 MG tablet, Take 1 tablet (10 mg total) by mouth daily., Disp: , Rfl:    ammonium lactate (LAC-HYDRIN) 12 % lotion, Apply 1 application topically 2 (two) times daily as needed for dry skin. , Disp: , Rfl:    aspirin EC 81 MG tablet, Take 81 mg by mouth daily., Disp: , Rfl:    atorvastatin (LIPITOR) 20 MG tablet, Take 20 mg by mouth every evening. , Disp: , Rfl:    Biotin 1 MG CAPS, Take 1 mg by mouth daily., Disp: , Rfl:    calcium carbonate (TUMS - DOSED IN MG ELEMENTAL CALCIUM) 500 MG chewable tablet, Chew 2.5 tablets (500 mg of elemental calcium total) by mouth every 6 (six) hours as needed for indigestion., Disp: , Rfl:    Cholecalciferol (VITAMIN D3) 25 MCG (1000 UT) CAPS, Take 1,000 Units by mouth daily. , Disp: , Rfl:    cyanocobalamin 1000 MCG tablet, Take 1,000 mcg by mouth daily. , Disp: , Rfl:    diphenhydrAMINE (BENADRYL) 25 MG tablet, Take 25 mg by mouth every 6 (six) hours as needed for itching., Disp: , Rfl:    epoetin alfa (EPOGEN) 10000 UNIT/ML injection, Inject 1 mL (10,000 Units total) into the vein every Monday,  Wednesday, and Friday with hemodialysis., Disp: 1 mL, Rfl:    Ferrous Sulfate (IRON) 325 (65 Fe) MG TABS, Take 325 mg by mouth daily. , Disp: , Rfl:    gabapentin (NEURONTIN) 100 MG capsule, Take 100 mg by mouth at bedtime., Disp: , Rfl:    HYDROcodone-acetaminophen (NORCO/VICODIN) 5-325 MG tablet, Take 1 tablet by mouth 3 (three) times daily as needed for moderate pain or severe pain., Disp: 30 tablet, Rfl: 0   hydrOXYzine (ATARAX/VISTARIL) 25 MG tablet, Take 25 mg by mouth daily as needed for itching., Disp: , Rfl:    lidocaine (LIDODERM) 5 %, Place 1 patch onto the skin daily. (Remove after 12 hours -- leave off for 12 hours before applying new patch), Disp: , Rfl:    meclizine (ANTIVERT) 25 MG tablet, Take 25 mg by mouth every 6 (six) hours as needed for dizziness., Disp: , Rfl:    mirtazapine (REMERON) 7.5 MG tablet, Take 7.5 mg by mouth daily., Disp: , Rfl:    multivitamin (RENA-VIT) TABS tablet, Take 1 tablet by mouth daily. , Disp: , Rfl:    mupirocin ointment (BACTROBAN) 2 %, Apply 1 application topically daily as needed (leg rash). , Disp: , Rfl:    nitroGLYCERIN (NITROSTAT) 0.4 MG SL tablet, Place 0.4 mg under the tongue every 5 (five) minutes as needed for chest pain. , Disp: , Rfl:    Nutritional Supplements (FEEDING SUPPLEMENT, NEPRO CARB STEADY,) LIQD, Take 237 mLs by mouth 2 (two) times daily between meals., Disp: , Rfl: 0   pantoprazole (PROTONIX) 40 MG tablet, Take 40 mg by  mouth daily., Disp: , Rfl:    polyethylene glycol (MIRALAX / GLYCOLAX) 17 g packet, Take 17 g by mouth daily as needed. (Patient taking differently: Take 17 g by mouth daily as needed for mild constipation or moderate constipation.), Disp: , Rfl:    Probiotic Product (PROBIOTIC PO), Take 1 capsule by mouth daily., Disp: , Rfl:    promethazine (PHENERGAN) 12.5 MG tablet, Take 1 tablet (12.5 mg total) by mouth every 6 (six) hours as needed for nausea or vomiting., Disp: 30 tablet, Rfl: 0   sevelamer carbonate  (RENVELA) 800 MG tablet, Take 800 mg by mouth 4 (four) times daily. (Take with meals and snack), Disp: , Rfl:    vitamin E 180 MG (400 UNITS) capsule, Take 400 Units by mouth daily., Disp: , Rfl:   Physical exam:  Vitals:   09/19/21 1043  BP: (!) 160/68  Pulse: 78  Resp: 16  Temp: (!) 96.8 F (36 C)  TempSrc: Tympanic  SpO2: 96%  Weight: 157 lb 6.5 oz (71.4 kg)   Physical Exam Constitutional:      Comments: Sitting in a wheelchair. Asleep in wheelchair. Awakens to touch. Unaccompanied.   HENT:     Head: Normocephalic and atraumatic.  Eyes:     Comments: Pinpoint pupils  Cardiovascular:     Rate and Rhythm: Normal rate and regular rhythm.  Pulmonary:     Effort: Pulmonary effort is normal.     Breath sounds: Normal breath sounds.  Abdominal:     General: There is no distension.     Palpations: Abdomen is soft.     Tenderness: There is no abdominal tenderness.  Skin:    General: Skin is warm and dry.  Neurological:     Mental Status: She is alert and oriented to person, place, and time.  Psychiatric:        Mood and Affect: Mood normal.        Behavior: Behavior normal.     CMP Latest Ref Rng & Units 06/04/2021  Glucose 70 - 99 mg/dL 85  BUN 8 - 23 mg/dL 12  Creatinine 0.44 - 1.00 mg/dL 3.26(H)  Sodium 135 - 145 mmol/L 131(L)  Potassium 3.5 - 5.1 mmol/L 3.8  Chloride 98 - 111 mmol/L 95(L)  CO2 22 - 32 mmol/L 26  Calcium 8.9 - 10.3 mg/dL 8.1(L)  Total Protein 6.5 - 8.1 g/dL 7.0  Total Bilirubin 0.3 - 1.2 mg/dL 0.7  Alkaline Phos 38 - 126 U/L 86  AST 15 - 41 U/L 16  ALT 0 - 44 U/L 10   CBC Latest Ref Rng & Units 06/04/2021  WBC 4.0 - 10.5 K/uL 5.2  Hemoglobin 12.0 - 15.0 g/dL 9.6(L)  Hematocrit 36.0 - 46.0 % 29.9(L)  Platelets 150 - 400 K/uL 140(L)    No images are attached to the encounter.  No results found.   Assessment and plan- Patient is a 60 y.o. female with MGUS and anemia of chronic kidney disease here for routine follow-up  Patient continues to  have poor venous access.  Previously we have had been multiple attempts to get blood which she failed.  Patient declines repeated blood draw today.  Prefers to have labs done through dialysis if possible.  Nursing will reach out to dialysis to see if they can draw ordered labs.  Previously, labs were consistent with anemia of chronic disease.  She receives Retacrit with nephrology and dialysis.  Again reviewed that if she has significant anemia despite EPO I recommend  a bone marrow biopsy.  Renal mass-previously seen by Dr. Erlene Quan but lost to follow-up due to several canceled appointments.  Encouraged her to have MRI rescheduled and to follow-up with Dr. Erlene Quan for results.  Follow-up based on results.  Otherwise we can consider seeing her again in 6 months.   Visit Diagnosis 1. MGUS (monoclonal gammopathy of unknown significance)   2. Anemia in ESRD (end-stage renal disease) ([place])   3. Renal mass    Beckey Rutter, DNP, AGNP-C [place] at [facility] [phone] (clinic) 09/19/2021

## 2021-09-19 NOTE — Patient Instructions (Signed)
Please contact Dr Cherrie Gauze office for an appointment for surveillance of right renal mass. [phone]

## 2021-09-19 NOTE — Progress Notes (Signed)
Pt in for follow up today.  

## 2021-09-20 ENCOUNTER — Other Ambulatory Visit: Payer: Self-pay

## 2021-09-25 ENCOUNTER — Other Ambulatory Visit
Admission: RE | Admit: 2021-09-25 | Discharge: 2021-09-25 | Disposition: A | Discharge status: Home / Self Care | Payer: Medicare Other | Source: Ambulatory Visit | Attending: Nurse Practitioner | Admitting: Nurse Practitioner

## 2021-09-25 DIAGNOSIS — D631 Anemia in chronic kidney disease: Secondary | ICD-10-CM | POA: Diagnosis present

## 2021-09-25 DIAGNOSIS — D472 Monoclonal gammopathy: Secondary | ICD-10-CM | POA: Insufficient documentation

## 2021-09-25 DIAGNOSIS — N186 End stage renal disease: Secondary | ICD-10-CM | POA: Insufficient documentation

## 2021-09-25 LAB — COMPREHENSIVE METABOLIC PANEL
ALT: 11 U/L (ref 0–44)
AST: 15 U/L (ref 15–41)
Albumin: 3.4 g/dL — ABNORMAL LOW (ref 3.5–5.0)
Alkaline Phosphatase: 87 U/L (ref 38–126)
Anion gap: 17 — ABNORMAL HIGH (ref 5–15)
BUN: 66 mg/dL — ABNORMAL HIGH (ref 8–23)
CO2: 23 mmol/L (ref 22–32)
Calcium: 8 mg/dL — ABNORMAL LOW (ref 8.9–10.3)
Chloride: 89 mmol/L — ABNORMAL LOW (ref 98–111)
Creatinine, Ser: 7.48 mg/dL — ABNORMAL HIGH (ref 0.44–1.00)
GFR, Estimated: 6 mL/min — ABNORMAL LOW (ref >60)
Glucose, Bld: 216 mg/dL — ABNORMAL HIGH (ref 70–99)
Potassium: 4.2 mmol/L (ref 3.5–5.1)
Sodium: 129 mmol/L — ABNORMAL LOW (ref 135–145)
Total Bilirubin: 0.7 mg/dL (ref 0.3–1.2)
Total Protein: 7.1 g/dL (ref 6.5–8.1)

## 2021-09-25 LAB — CBC WITH DIFFERENTIAL/PLATELET
Abs Immature Granulocytes: 0.03 10*3/uL (ref 0.00–0.07)
Basophils Absolute: 0.1 10*3/uL (ref 0.0–0.1)
Basophils Relative: 1 %
Eosinophils Absolute: 1 10*3/uL — ABNORMAL HIGH (ref 0.0–0.5)
Eosinophils Relative: 12 %
HCT: 28.8 % — ABNORMAL LOW (ref 36.0–46.0)
Hemoglobin: 9.6 g/dL — ABNORMAL LOW (ref 12.0–15.0)
Immature Granulocytes: 0 %
Lymphocytes Relative: 23 %
Lymphs Abs: 2 10*3/uL (ref 0.7–4.0)
MCH: 29.4 pg (ref 26.0–34.0)
MCHC: 33.3 g/dL (ref 30.0–36.0)
MCV: 88.3 fL (ref 80.0–100.0)
Monocytes Absolute: 1.1 10*3/uL — ABNORMAL HIGH (ref 0.1–1.0)
Monocytes Relative: 13 %
Neutro Abs: 4.3 10*3/uL (ref 1.7–7.7)
Neutrophils Relative %: 51 %
Platelets: 161 10*3/uL (ref 150–400)
RBC: 3.26 MIL/uL — ABNORMAL LOW (ref 3.87–5.11)
RDW: 17.4 % — ABNORMAL HIGH (ref 11.5–15.5)
WBC: 8.5 10*3/uL (ref 4.0–10.5)
nRBC: 0 % (ref 0.0–0.2)

## 2021-09-25 LAB — FERRITIN: Ferritin: 656 ng/mL — ABNORMAL HIGH (ref 11–307)

## 2021-09-26 LAB — KAPPA/LAMBDA LIGHT CHAINS
Kappa free light chain: 206.1 mg/L — ABNORMAL HIGH (ref 3.3–19.4)
Kappa, lambda light chain ratio: 1.17 (ref 0.26–1.65)
Lambda free light chains: 176.1 mg/L — ABNORMAL HIGH (ref 5.7–26.3)

## 2021-09-26 LAB — IRON AND TIBC
Iron: 42 ug/dL (ref 28–170)
Saturation Ratios: 26 % (ref 10.4–31.8)
TIBC: 161 ug/dL — ABNORMAL LOW (ref 250–450)
UIBC: 119 ug/dL

## 2021-09-30 LAB — MULTIPLE MYELOMA PANEL, SERUM
Albumin SerPl Elph-Mcnc: 3.5 g/dL (ref 2.9–4.4)
Albumin/Glob SerPl: 1.2 (ref 0.7–1.7)
Alpha 1: 0.4 g/dL (ref 0.0–0.4)
Alpha2 Glob SerPl Elph-Mcnc: 0.6 g/dL (ref 0.4–1.0)
B-Globulin SerPl Elph-Mcnc: 0.8 g/dL (ref 0.7–1.3)
Gamma Glob SerPl Elph-Mcnc: 1.3 g/dL (ref 0.4–1.8)
Globulin, Total: 3.1 g/dL (ref 2.2–3.9)
IgA: 364 mg/dL — ABNORMAL HIGH (ref 87–352)
IgG (Immunoglobin G), Serum: 1216 mg/dL (ref 586–1602)
IgM (Immunoglobulin M), Srm: 103 mg/dL (ref 26–217)
Total Protein ELP: 6.6 g/dL (ref 6.0–8.5)

## 2021-10-10 IMAGING — CR DG CHEST 2V
1 series · 2 of 2 positions shown · non-contrast
Comparison: 04/26/2021

CLINICAL DATA: Chest pain beginning after dialysis.

EXAM:
CHEST - 2 VIEW

[Series 1: dg chest 2 view · 0.14mm/px · 2 of 2 slices shown]
[im 1/2]
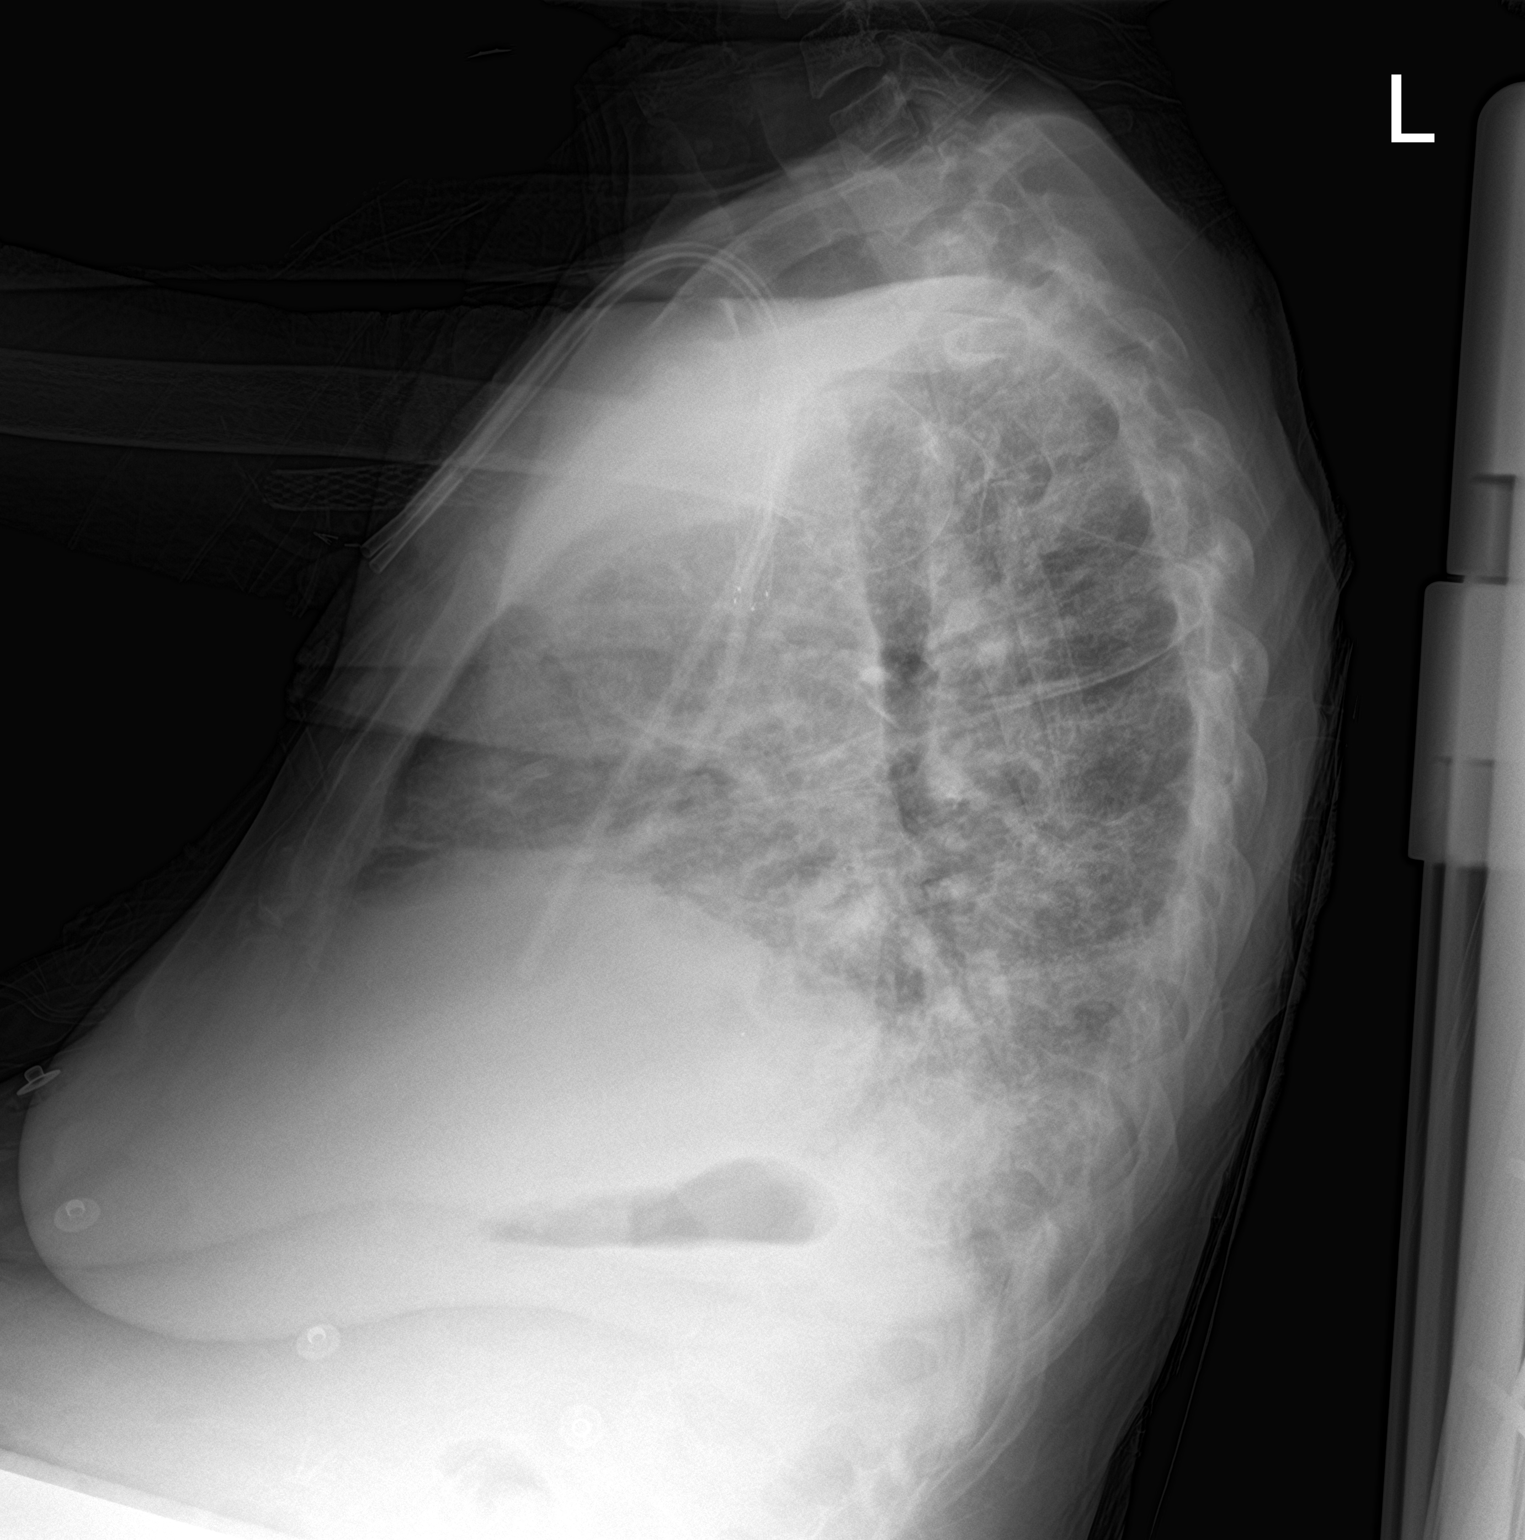
[im 2/2]
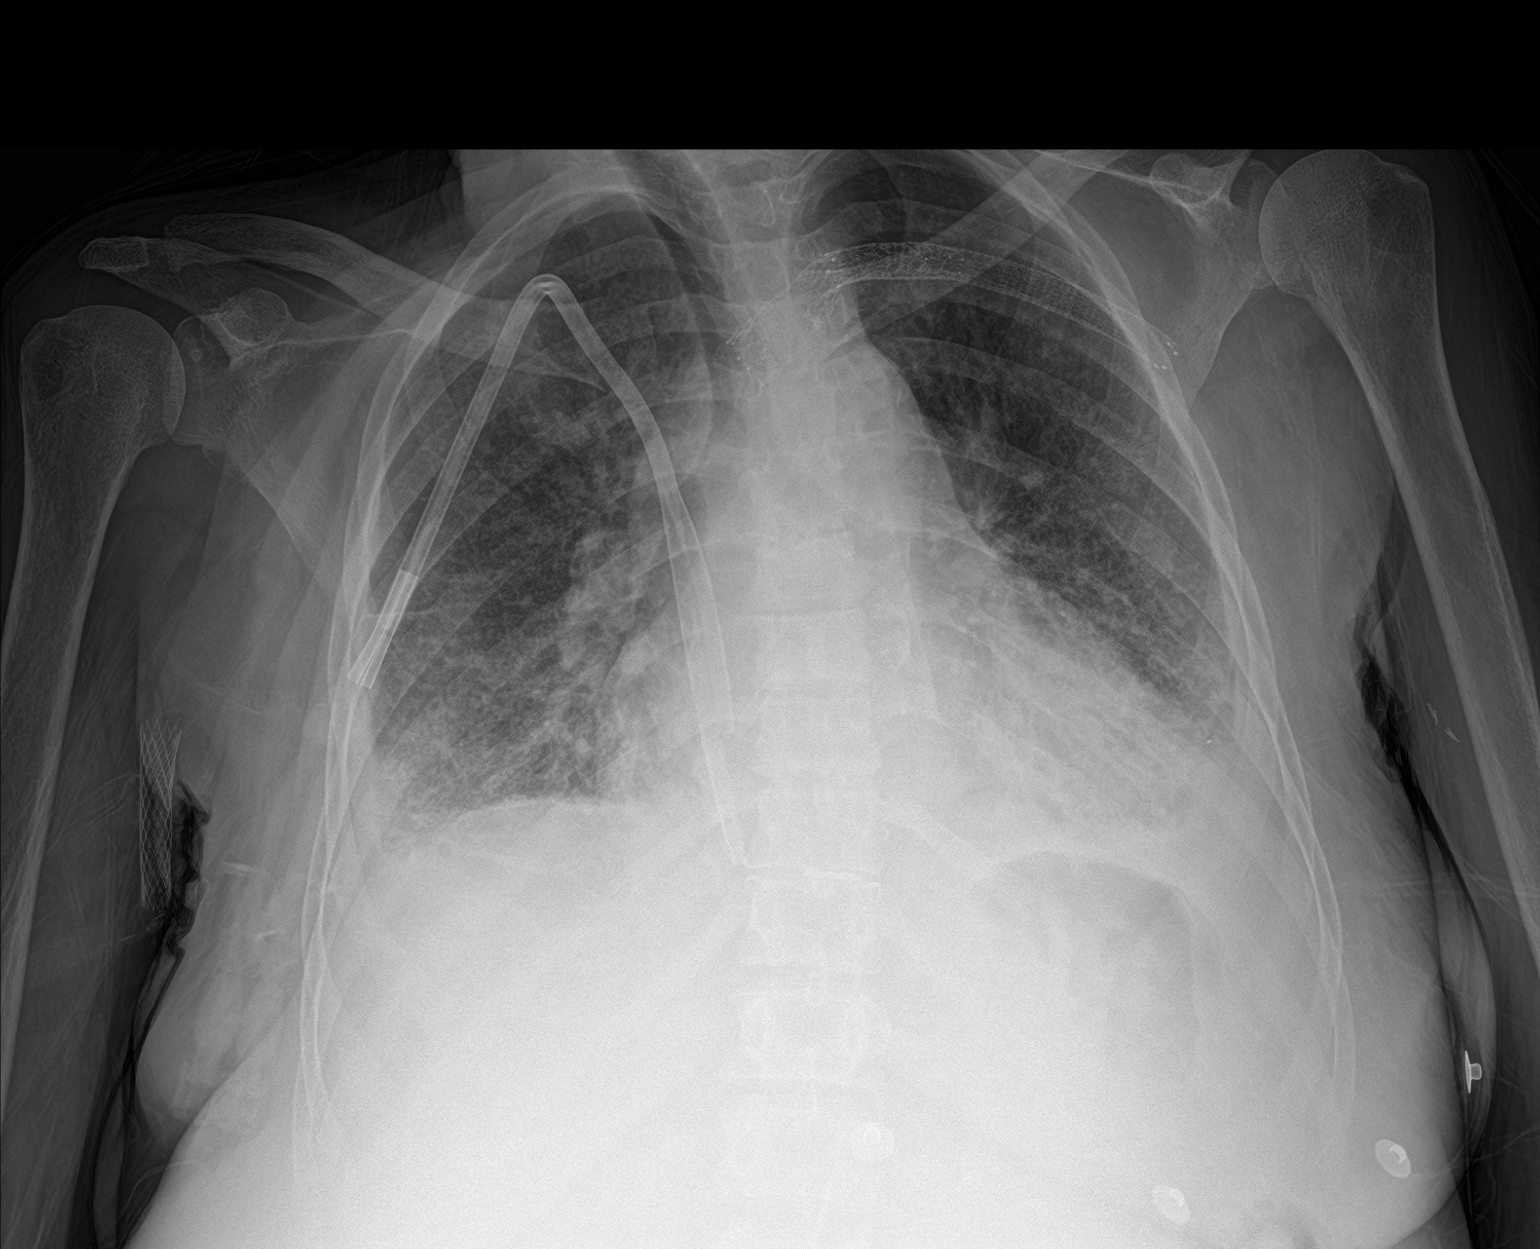

[2 of 2 positions shown; findings below may reference images not displayed]

FINDINGS: Central line in place with tips in the right atrium. Left-sided
vascular stent in place. There is cardiomegaly with interstitial and
alveolar edema, small effusions and dependent pulmonary atelectasis.
IMPRESSION: Congestive heart failure/fluid overload. Cardiomegaly, interstitial
and alveolar edema, small effusions and basilar atelectasis.

## 2021-10-16 NOTE — Progress Notes (Signed)
MRN : [id number]  [name] is a 61 y.o. ([birth year]) female who presents with chief complaint of swelling.  History of Present Illness:  The patient is seen for evaluation of dialysis access.  The patient has a history of multiple failed accesses.  There have been accesses in both arms.     Current access is via a catheter which is functioning with problems.  She is concerned because the silk stitch is tugging and that still bothers her.  The patient is also followed for leg swelling.  The swelling has persisted and the pain associated with swelling continues. There have not been any interval development of a ulcerations or wounds.  Since the previous visit the patient has been wearing graduated compression stockings and has noted little if any improvement in the lymphedema. The patient has been using compression routinely morning until night.  The patient also states elevation during the day and exercise is being done too.    The patient denies amaurosis fugax or recent TIA symptoms. There are no recent neurological changes noted. The patient denies claudication symptoms or rest pain symptoms. The patient denies history of DVT, PE or superficial thrombophlebitis. The patient denies recent episodes of angina or shortness of breath.    No outpatient medications have been marked as taking for the 10/17/21 encounter (Appointment) with Delana Meyer, Dolores Lory, MD.    Past Medical History:  Diagnosis Date   Anemia    Anginal pain ([place])    Anxiety    Arthritis    Asthma    Broken wrist    Bronchitis    chronic diastolic CHF 05/26/91   Chronic kidney disease    COPD (chronic obstructive pulmonary disease) ([place])    Coronary artery disease    a. cath 2013: stenting to RCA (report not available); b. cath 2014: LM nl, pLAD 40%, mLAD nl, ost LCx 40%, mid LCx nl, pRCA 30% @ site of prior stent, mRCA 50%   Depression    Diabetes mellitus ([place])    Diabetes mellitus without complication ([place])     Diabetic neuropathy ([place])    dialysis 2006   Diverticulosis    Dizziness    Dyspnea    Elevated lipids    Environmental and seasonal allergies    ESRD (end stage renal disease) on dialysis ([place])    M-W-F   Gastroparesis    GERD (gastroesophageal reflux disease)    Headache    History of anemia due to chronic kidney disease    History of hiatal hernia    HOH (hard of hearing)    Hx of pancreatitis 2015   Hypertension    Lower extremity edema    Mitral regurgitation    a. echo 10/2013: EF 62%, noWMA, mildly dilated LA, mild to mod MR/TR, GR1DD   Myocardial infarction ([place])    Orthopnea    Parathyroid abnormality ([place])    Peripheral arterial disease ([place])    Pneumonia    Renal cancer ([place])    Renal insufficiency    Pt is on dialysis on M,W + F.   Wheezing     Past Surgical History:  Procedure Laterality Date   A/V SHUNTOGRAM Left 01/20/2018   Procedure: A/V SHUNTOGRAM;  Surgeon: Algernon Huxley, MD;  Location: [place] [facility];  Service: Cardiovascular;  Laterality: Left;   ABDOMINAL HYSTERECTOMY  1992   AMPUTATION TOE Left 10/02/2017   Procedure: AMPUTATION TOE-LEFT GREAT TOE;  Surgeon: Albertine Patricia, DPM;  Location: [facility]  ORS;  Service: Podiatry;  Laterality: Left;   APPENDECTOMY     APPLICATION OF WOUND VAC N/A 11/25/2019   Procedure: APPLICATION OF WOUND VAC;  Surgeon: Katha Cabal, MD;  Location: [facility] ORS;  Service: Vascular;  Laterality: N/A;   ARTERY BIOPSY Right 10/11/2018   Procedure: BIOPSY TEMPORAL ARTERY;  Surgeon: Vickie Epley, MD;  Location: [facility] ORS;  Service: General;  Laterality: Right;   CARDIAC CATHETERIZATION Left 07/26/2015   Procedure: Left Heart Cath and Coronary Angiography;  Surgeon: Dionisio David, MD;  Location: [place] [facility];  Service: Cardiovascular;  Laterality: Left;   CATARACT EXTRACTION W/ INTRAOCULAR LENS IMPLANT Right    CATARACT EXTRACTION W/PHACO Left 03/10/2017   Procedure: CATARACT EXTRACTION PHACO AND INTRAOCULAR LENS  PLACEMENT ([place]);  Surgeon: Birder Robson, MD;  Location: [facility] ORS;  Service: Ophthalmology;  Laterality: Left;  [place] 00:51.9 AP% 14.2 CDE 7.39 fluid pack lot # [id number]   CENTRAL LINE INSERTION Right 11/11/2019   Procedure: CENTRAL LINE INSERTION;  Surgeon: Katha Cabal, MD;  Location: [facility] ORS;  Service: Vascular;  Laterality: Right;   CENTRAL LINE INSERTION  11/25/2019   Procedure: CENTRAL LINE INSERTION;  Surgeon: Katha Cabal, MD;  Location: [facility] ORS;  Service: Vascular;;   CHOLECYSTECTOMY     COLONOSCOPY WITH PROPOFOL N/A 08/12/2016   Procedure: COLONOSCOPY WITH PROPOFOL;  Surgeon: Lollie Sails, MD;  Location: [facility] ENDOSCOPY;  Service: Endoscopy;  Laterality: N/A;   DIALYSIS FISTULA CREATION Left    upper arm   dialysis grafts     DIALYSIS/PERMA CATHETER INSERTION N/A 11/14/2019   Procedure: DIALYSIS/PERMA CATHETER INSERTION;  Surgeon: Algernon Huxley, MD;  Location: [place] [facility];  Service: Cardiovascular;  Laterality: N/A;   DIALYSIS/PERMA CATHETER INSERTION N/A 02/03/2020   Procedure: DIALYSIS/PERMA CATHETER INSERTION;  Surgeon: Katha Cabal, MD;  Location: [place] [facility];  Service: Cardiovascular;  Laterality: N/A;   DIALYSIS/PERMA CATHETER INSERTION N/A 06/19/2020   Procedure: DIALYSIS/PERMA CATHETER INSERTION;  Surgeon: Katha Cabal, MD;  Location: [place] [facility];  Service: Cardiovascular;  Laterality: N/A;   DIALYSIS/PERMA CATHETER INSERTION N/A 05/07/2021   Procedure: DIALYSIS/PERMA CATHETER INSERTION;  Surgeon: Katha Cabal, MD;  Location: [place] [facility];  Service: Cardiovascular;  Laterality: N/A;  Patient needs general anethesia   DIALYSIS/PERMA CATHETER REMOVAL N/A 05/25/2020   Procedure: DIALYSIS/PERMA CATHETER REMOVAL;  Surgeon: Katha Cabal, MD;  Location: [place] [facility];  Service: Cardiovascular;  Laterality: N/A;   ESOPHAGOGASTRODUODENOSCOPY N/A 03/08/2015   Procedure: ESOPHAGOGASTRODUODENOSCOPY (EGD);   Surgeon: Manya Silvas, MD;  Location: [facility] ENDOSCOPY;  Service: Endoscopy;  Laterality: N/A;   ESOPHAGOGASTRODUODENOSCOPY (EGD) WITH PROPOFOL N/A 03/18/2016   Procedure: ESOPHAGOGASTRODUODENOSCOPY (EGD) WITH PROPOFOL;  Surgeon: [name], MD;  Location: [facility] ENDOSCOPY;  Service: Endoscopy;  Laterality: N/A;   EYE SURGERY Right 2018   FECAL TRANSPLANT N/A 08/23/2015   Procedure: FECAL TRANSPLANT;  Surgeon: Manya Silvas, MD;  Location: [facility] ENDOSCOPY;  Service: Endoscopy;  Laterality: N/A;   HAND SURGERY Bilateral    HEMATOMA EVACUATION Left 11/25/2019   Procedure: EVACUATION HEMATOMA;  Surgeon: Katha Cabal, MD;  Location: [facility] ORS;  Service: Vascular;  Laterality: Left;   I & D EXTREMITY Left 11/25/2019   Procedure: IRRIGATION AND DEBRIDEMENT EXTREMITY;  Surgeon: Katha Cabal, MD;  Location: [facility] ORS;  Service: Vascular;  Laterality: Left;   IR FLUORO GUIDE CV LINE RIGHT  04/06/2020   IR INJECT/THERA/INC NEEDLE/CATH/PLC EPI/CERV/THOR [facility]  08/13/2020  IR RADIOLOGIST EVAL & MGMT  07/28/2019   IR RADIOLOGIST EVAL & MGMT  08/11/2019   LIGATION OF ARTERIOVENOUS  FISTULA Left 11/11/2019   LIGATION OF ARTERIOVENOUS  FISTULA Left 11/11/2019   Procedure: LIGATION OF ARTERIOVENOUS  FISTULA;  Surgeon: Katha Cabal, MD;  Location: [facility] ORS;  Service: Vascular;  Laterality: Left;   LIGATIONS OF HERO GRAFT Right 06/13/2020   Procedure: LIGATION / REMOVAL OF RIGHT HERO GRAFT;  Surgeon: Katha Cabal, MD;  Location: [facility] ORS;  Service: Vascular;  Laterality: Right;   PERIPHERAL VASCULAR CATHETERIZATION N/A 12/20/2015   Procedure: Thrombectomy of dialysis access versus permcath placement;  Surgeon: Algernon Huxley, MD;  Location: [place] [facility];  Service: Cardiovascular;  Laterality: N/A;   PERIPHERAL VASCULAR CATHETERIZATION N/A 12/20/2015   Procedure: A/V Shunt Intervention;  Surgeon: Algernon Huxley, MD;  Location: [place] [facility];  Service: Cardiovascular;  Laterality: N/A;    PERIPHERAL VASCULAR CATHETERIZATION N/A 12/20/2015   Procedure: A/V Shuntogram/Fistulagram;  Surgeon: Algernon Huxley, MD;  Location: [place] [facility];  Service: Cardiovascular;  Laterality: N/A;   PERIPHERAL VASCULAR CATHETERIZATION N/A 01/02/2016   Procedure: A/V Shuntogram/Fistulagram;  Surgeon: Algernon Huxley, MD;  Location: [place] [facility];  Service: Cardiovascular;  Laterality: N/A;   PERIPHERAL VASCULAR CATHETERIZATION N/A 01/02/2016   Procedure: A/V Shunt Intervention;  Surgeon: Algernon Huxley, MD;  Location: [place] [facility];  Service: Cardiovascular;  Laterality: N/A;   TEE WITHOUT CARDIOVERSION N/A 06/11/2020   Procedure: TRANSESOPHAGEAL ECHOCARDIOGRAM (TEE);  Surgeon: Teodoro Spray, MD;  Location: [facility] ORS;  Service: Cardiovascular;  Laterality: N/A;   UPPER EXTREMITY ANGIOGRAPHY Bilateral 11/27/2020   Procedure: UPPER EXTREMITY ANGIOGRAPHY;  Surgeon: Katha Cabal, MD;  Location: [place] [facility];  Service: Cardiovascular;  Laterality: Bilateral;   UPPER EXTREMITY VENOGRAPHY Right 01/18/2020   Procedure: UPPER EXTREMITY VENOGRAPHY;  Surgeon: Katha Cabal, MD;  Location: [place] [facility];  Service: Cardiovascular;  Laterality: Right;   UPPER EXTREMITY VENOGRAPHY Bilateral 11/27/2020   Procedure: UPPER EXTREMITY VENOGRAPHY;  Surgeon: Katha Cabal, MD;  Location: [place] [facility];  Service: Cardiovascular;  Laterality: Bilateral;   VASCULAR ACCESS DEVICE INSERTION Right 04/13/2020   Procedure: INSERTION OF HERO VASCULAR ACCESS DEVICE (GRAFT);  Surgeon: Katha Cabal, MD;  Location: [facility] ORS;  Service: Vascular;  Laterality: Right;    Social History Social History   Tobacco Use   Smoking status: Never   Smokeless tobacco: Never  Vaping Use   Vaping Use: Never used  Substance Use Topics   Alcohol use: Not Currently    Comment: glass wine week per pt   Drug use: Yes    Types: Marijuana    Comment: once a day    Family History Family History   Problem Relation Age of Onset   Kidney disease Mother    Diabetes Mother    Cancer Father    Kidney disease Sister     Allergies  Allergen Reactions   Ace Inhibitors Swelling and Anaphylaxis   Ativan [Lorazepam] Other (See Comments)    Reaction:  Hallucinations and headaches   Compazine [Prochlorperazine Edisylate] Anaphylaxis, Nausea And Vomiting and Other (See Comments)    Other reaction(s): dystonia from this vs. Reglan, 23 Jul - patient relates that she takes promethazine frequently with no problems   Sumatriptan Succinate Other (See Comments)    Other reaction(s): delirium and hallucinations per [facility] records   Zofran [Ondansetron] Nausea And Vomiting  Per pt. she is allergic to zofran or will experience adverse reaction like hallucination    Losartan Nausea Only   Prochlorperazine Other (See Comments)    Reaction:  Unknown . Patient does not remember reaction but she does have vertigo and anxiety along with n and v at times. Could be used to treat any of these    Reglan [Metoclopramide] Other (See Comments)    Per patient her Dr. Evelina Bucy her off it    Scopolamine Other (See Comments)    Dizziness, also has vertigo already   Tape Rash    Plastic tape causes rash   Tapentadol Rash   Ultrasound Gel Itching    Patient states the Ultrasound gel caused itching while in skin contact and resolved when wiped away.     REVIEW OF SYSTEMS (Negative unless checked)  Constitutional: [] Weight loss  [] Fever  [] Chills Cardiac: [] Chest pain   [] Chest pressure   [] Palpitations   [] Shortness of breath when laying flat   [x] Shortness of breath with exertion. Vascular:  [] Pain in legs with walking   [] Pain in legs at rest  [] History of DVT   [] Phlebitis   [x] Swelling in legs   [] Varicose veins   [] Non-healing ulcers Pulmonary:   [] Uses home oxygen   [] Productive cough   [] Hemoptysis   [] Wheeze  [x] COPD   [] Asthma Neurologic:  [] Dizziness   [] Seizures   [] History of stroke   [] History of TIA   [] Aphasia   [] Vissual changes   [] Weakness or numbness in arm   [x] Weakness or numbness in leg Musculoskeletal:   [] Joint swelling   [] Joint pain   [] Low back pain Hematologic:  [] Easy bruising  [] Easy bleeding   [] Hypercoagulable state   [] Anemic Gastrointestinal:  [] Diarrhea   [] Vomiting  [x] Gastroesophageal reflux/heartburn   [] Difficulty swallowing. Genitourinary:  [x] Chronic kidney disease   [] Difficult urination  [] Frequent urination   [] Blood in urine Skin:  [] Rashes   [] Ulcers  Psychological:  [] History of anxiety   []  History of major depression.  Physical Examination  There were no vitals filed for this visit. There is no height or weight on file to calculate BMI. Gen: WD/WN, NAD Head: [place]/AT, No temporalis wasting.  Ear/Nose/Throat: Hearing grossly intact, nares w/o erythema or drainage, pinna without lesions Eyes: PER, EOMI, sclera nonicteric.  Neck: Supple, no gross masses.  No JVD.  Pulmonary:  Good air movement, no audible wheezing, no use of accessory muscles.  Cardiac: RRR, precordium not hyperdynamic. Vascular:  scattered varicosities present bilaterally.  Mild venous stasis changes to the legs bilaterally.  4+ soft pitting edema.  Catheter clean dry and intact Vessel Right Left  Radial Palpable Palpable  Gastrointestinal: soft, non-distended. No guarding/no peritoneal signs.  Musculoskeletal: M/S 5/5 throughout.  No deformity.  Neurologic: CN 2-12 intact. Pain and light touch intact in extremities.  Symmetrical.  Speech is fluent. Motor exam as listed above. Psychiatric: Judgment intact, Mood & affect appropriate for pt's clinical situation. Dermatologic: Venous rashes no ulcers noted.  No changes consistent with cellulitis. Lymph : No lichenification or skin changes of chronic lymphedema.  CBC Lab Results  Component Value Date   WBC 8.5 09/25/2021   HGB 9.6 (L) 09/25/2021   HCT 28.8 (L) 09/25/2021   MCV 88.3 09/25/2021   PLT 161 09/25/2021    BMET     Component Value Date/Time   NA 129 (L) 09/25/2021 1400   NA 135 (L) 09/15/2014 0948   K 4.2 09/25/2021 1400   K 4.8 09/15/2014 0948  CL 89 (L) 09/25/2021 1400   CL 99 09/15/2014 0948   CO2 23 09/25/2021 1400   CO2 26 09/15/2014 0948   GLUCOSE 216 (H) 09/25/2021 1400   GLUCOSE 118 (H) 09/15/2014 0948   BUN 66 (H) 09/25/2021 1400   BUN 19 (H) 09/15/2014 0948   CREATININE 7.48 (H) 09/25/2021 1400   CREATININE 6.79 (H) 09/15/2014 0948   CALCIUM 8.0 (L) 09/25/2021 1400   CALCIUM 8.3 (L) 09/15/2014 0948   GFRNONAA 6 (L) 09/25/2021 1400   GFRNONAA 7 (L) 09/15/2014 0948   GFRNONAA 6 (L) 05/31/2014 0432   GFRAA 11 (L) 06/25/2020 0641   GFRAA 8 (L) 09/15/2014 0948   GFRAA 7 (L) 05/31/2014 0432   CrCl cannot be calculated (Unknown ideal weight.).  COAG Lab Results  Component Value Date   INR 1.2 04/25/2021   INR 1.3 (H) 03/29/2021   INR 1.3 (H) 03/17/2021    Radiology No results found.   Assessment/Plan 1. ESRD on hemodialysis [facility]) Recommend:  The patient is experiencing increasing problems with their dialysis access.  Patient should have a left arm venogram to see if a HERO graft can be placed  The risks, benefits and alternative therapies were reviewed in detail with the patient.  All questions were answered.  The patient agrees to proceed with angio/intervention.      2. Lymphedema Recommend:  No surgery or intervention at this point in time.    I have reviewed my previous discussion with the patient regarding swelling and why it causes symptoms.  Patient will continue wearing graduated compression stockings class 1 (20-30 mmHg) on a daily basis. The patient will  beginning wearing the stockings first thing in the morning and removing them in the evening. The patient is instructed specifically not to sleep in the stockings.    In addition, behavioral modification including several periods of elevation of the lower extremities during the day will be continued.  This  was reviewed with the patient during the initial visit.  The patient will also continue routine exercise, especially walking on a daily basis as was discussed during the initial visit.    Despite conservative treatments including graduated compression therapy class 1 and behavioral modification including exercise and elevation the patient  has not obtained adequate control of the lymphedema.  The patient still has stage 3 lymphedema and therefore, I believe that a lymph pump should be added to improve the control of the patient's lymphedema.  Additionally, a lymph pump is warranted because it will reduce the risk of cellulitis and ulceration in the future.  Patient should follow-up in six months    3. Chronic venous insufficiency Recommend:  No surgery or intervention at this point in time.    I have reviewed my previous discussion with the patient regarding swelling and why it causes symptoms.  Patient will continue wearing graduated compression stockings class 1 (20-30 mmHg) on a daily basis. The patient will  beginning wearing the stockings first thing in the morning and removing them in the evening. The patient is instructed specifically not to sleep in the stockings.    In addition, behavioral modification including several periods of elevation of the lower extremities during the day will be continued.  This was reviewed with the patient during the initial visit.  The patient will also continue routine exercise, especially walking on a daily basis as was discussed during the initial visit.    Despite conservative treatments including graduated compression therapy class 1 and behavioral modification  including exercise and elevation the patient  has not obtained adequate control of the lymphedema.  The patient still has stage 3 lymphedema and therefore, I believe that a lymph pump should be added to improve the control of the patient's lymphedema.  Additionally, a lymph pump is warranted  because it will reduce the risk of cellulitis and ulceration in the future.  Patient should follow-up in six months    4. Coronary artery disease involving native coronary artery of native heart without angina pectoris Continue cardiac and antihypertensive medications as already ordered and reviewed, no changes at this time.  Continue statin as ordered and reviewed, no changes at this time  Nitrates PRN for chest pain   5. Primary hypertension Continue antihypertensive medications as already ordered, these medications have been reviewed and there are no changes at this time.   6. Chronic obstructive pulmonary disease, unspecified COPD type ([place]) Continue pulmonary medications and aerosols as already ordered, these medications have been reviewed and there are no changes at this time.      Hortencia Pilar, MD  10/16/2021 1:11 PM

## 2021-10-16 NOTE — H&P (View-Only) (Signed)
MRN : [id number]  CHELCIE ESTORGA is a 61 y.o. ([birth year]) female who presents with chief complaint of swelling.  History of Present Illness:  The patient is seen for evaluation of dialysis access.  The patient has a history of multiple failed accesses.  There have been accesses in both arms.     Current access is via a catheter which is functioning with problems.  She is concerned because the silk stitch is tugging and that still bothers her.  The patient is also followed for leg swelling.  The swelling has persisted and the pain associated with swelling continues. There have not been any interval development of a ulcerations or wounds.  Since the previous visit the patient has been wearing graduated compression stockings and has noted little if any improvement in the lymphedema. The patient has been using compression routinely morning until night.  The patient also states elevation during the day and exercise is being done too.    The patient denies amaurosis fugax or recent TIA symptoms. There are no recent neurological changes noted. The patient denies claudication symptoms or rest pain symptoms. The patient denies history of DVT, PE or superficial thrombophlebitis. The patient denies recent episodes of angina or shortness of breath.    No outpatient medications have been marked as taking for the 10/17/21 encounter (Appointment) with Delana Meyer, Dolores Lory, MD.    Past Medical History:  Diagnosis Date   Anemia    Anginal pain ([place])    Anxiety    Arthritis    Asthma    Broken wrist    Bronchitis    chronic diastolic CHF 2/94/7654   Chronic kidney disease    COPD (chronic obstructive pulmonary disease) ([place])    Coronary artery disease    a. cath 2013: stenting to RCA (report not available); b. cath 2014: LM nl, pLAD 40%, mLAD nl, ost LCx 40%, mid LCx nl, pRCA 30% @ site of prior stent, mRCA 50%   Depression    Diabetes mellitus ([place])    Diabetes mellitus without complication ([place])     Diabetic neuropathy ([place])    dialysis 2006   Diverticulosis    Dizziness    Dyspnea    Elevated lipids    Environmental and seasonal allergies    ESRD (end stage renal disease) on dialysis ([place])    M-W-F   Gastroparesis    GERD (gastroesophageal reflux disease)    Headache    History of anemia due to chronic kidney disease    History of hiatal hernia    HOH (hard of hearing)    Hx of pancreatitis 2015   Hypertension    Lower extremity edema    Mitral regurgitation    a. echo 10/2013: EF 62%, noWMA, mildly dilated LA, mild to mod MR/TR, GR1DD   Myocardial infarction ([place])    Orthopnea    Parathyroid abnormality ([place])    Peripheral arterial disease ([place])    Pneumonia    Renal cancer ([place])    Renal insufficiency    Pt is on dialysis on M,W + F.   Wheezing     Past Surgical History:  Procedure Laterality Date   A/V SHUNTOGRAM Left 01/20/2018   Procedure: A/V SHUNTOGRAM;  Surgeon: Algernon Huxley, MD;  Location: [place] [facility];  Service: Cardiovascular;  Laterality: Left;   ABDOMINAL HYSTERECTOMY  1992   AMPUTATION TOE Left 10/02/2017   Procedure: AMPUTATION TOE-LEFT GREAT TOE;  Surgeon: Albertine Patricia, DPM;  Location: [facility]  ORS;  Service: Podiatry;  Laterality: Left;   APPENDECTOMY     APPLICATION OF WOUND VAC N/A 11/25/2019   Procedure: APPLICATION OF WOUND VAC;  Surgeon: Katha Cabal, MD;  Location: [facility] ORS;  Service: Vascular;  Laterality: N/A;   ARTERY BIOPSY Right 10/11/2018   Procedure: BIOPSY TEMPORAL ARTERY;  Surgeon: Vickie Epley, MD;  Location: [facility] ORS;  Service: General;  Laterality: Right;   CARDIAC CATHETERIZATION Left 07/26/2015   Procedure: Left Heart Cath and Coronary Angiography;  Surgeon: Dionisio David, MD;  Location: [place] [facility];  Service: Cardiovascular;  Laterality: Left;   CATARACT EXTRACTION W/ INTRAOCULAR LENS IMPLANT Right    CATARACT EXTRACTION W/PHACO Left 03/10/2017   Procedure: CATARACT EXTRACTION PHACO AND INTRAOCULAR LENS  PLACEMENT ([place]);  Surgeon: Birder Robson, MD;  Location: [facility] ORS;  Service: Ophthalmology;  Laterality: Left;  [place] 00:51.9 AP% 14.2 CDE 7.39 fluid pack lot # [id number]   CENTRAL LINE INSERTION Right 11/11/2019   Procedure: CENTRAL LINE INSERTION;  Surgeon: Katha Cabal, MD;  Location: [facility] ORS;  Service: Vascular;  Laterality: Right;   CENTRAL LINE INSERTION  11/25/2019   Procedure: CENTRAL LINE INSERTION;  Surgeon: Katha Cabal, MD;  Location: [facility] ORS;  Service: Vascular;;   CHOLECYSTECTOMY     COLONOSCOPY WITH PROPOFOL N/A 08/12/2016   Procedure: COLONOSCOPY WITH PROPOFOL;  Surgeon: Lollie Sails, MD;  Location: [facility] ENDOSCOPY;  Service: Endoscopy;  Laterality: N/A;   DIALYSIS FISTULA CREATION Left    upper arm   dialysis grafts     DIALYSIS/PERMA CATHETER INSERTION N/A 11/14/2019   Procedure: DIALYSIS/PERMA CATHETER INSERTION;  Surgeon: Algernon Huxley, MD;  Location: [place] [facility];  Service: Cardiovascular;  Laterality: N/A;   DIALYSIS/PERMA CATHETER INSERTION N/A 02/03/2020   Procedure: DIALYSIS/PERMA CATHETER INSERTION;  Surgeon: Katha Cabal, MD;  Location: [place] [facility];  Service: Cardiovascular;  Laterality: N/A;   DIALYSIS/PERMA CATHETER INSERTION N/A 06/19/2020   Procedure: DIALYSIS/PERMA CATHETER INSERTION;  Surgeon: Katha Cabal, MD;  Location: [place] [facility];  Service: Cardiovascular;  Laterality: N/A;   DIALYSIS/PERMA CATHETER INSERTION N/A 05/07/2021   Procedure: DIALYSIS/PERMA CATHETER INSERTION;  Surgeon: Katha Cabal, MD;  Location: [place] [facility];  Service: Cardiovascular;  Laterality: N/A;  Patient needs general anethesia   DIALYSIS/PERMA CATHETER REMOVAL N/A 05/25/2020   Procedure: DIALYSIS/PERMA CATHETER REMOVAL;  Surgeon: Katha Cabal, MD;  Location: [place] [facility];  Service: Cardiovascular;  Laterality: N/A;   ESOPHAGOGASTRODUODENOSCOPY N/A 03/08/2015   Procedure: ESOPHAGOGASTRODUODENOSCOPY (EGD);   Surgeon: Manya Silvas, MD;  Location: [facility] ENDOSCOPY;  Service: Endoscopy;  Laterality: N/A;   ESOPHAGOGASTRODUODENOSCOPY (EGD) WITH PROPOFOL N/A 03/18/2016   Procedure: ESOPHAGOGASTRODUODENOSCOPY (EGD) WITH PROPOFOL;  Surgeon: [name], MD;  Location: [facility] ENDOSCOPY;  Service: Endoscopy;  Laterality: N/A;   EYE SURGERY Right 2018   FECAL TRANSPLANT N/A 08/23/2015   Procedure: FECAL TRANSPLANT;  Surgeon: Manya Silvas, MD;  Location: [facility] ENDOSCOPY;  Service: Endoscopy;  Laterality: N/A;   HAND SURGERY Bilateral    HEMATOMA EVACUATION Left 11/25/2019   Procedure: EVACUATION HEMATOMA;  Surgeon: Katha Cabal, MD;  Location: [facility] ORS;  Service: Vascular;  Laterality: Left;   I & D EXTREMITY Left 11/25/2019   Procedure: IRRIGATION AND DEBRIDEMENT EXTREMITY;  Surgeon: Katha Cabal, MD;  Location: [facility] ORS;  Service: Vascular;  Laterality: Left;   IR FLUORO GUIDE CV LINE RIGHT  04/06/2020   IR INJECT/THERA/INC NEEDLE/CATH/PLC EPI/CERV/THOR [facility]  08/13/2020  IR RADIOLOGIST EVAL & MGMT  07/28/2019   IR RADIOLOGIST EVAL & MGMT  08/11/2019   LIGATION OF ARTERIOVENOUS  FISTULA Left 11/11/2019   LIGATION OF ARTERIOVENOUS  FISTULA Left 11/11/2019   Procedure: LIGATION OF ARTERIOVENOUS  FISTULA;  Surgeon: Katha Cabal, MD;  Location: [facility] ORS;  Service: Vascular;  Laterality: Left;   LIGATIONS OF HERO GRAFT Right 06/13/2020   Procedure: LIGATION / REMOVAL OF RIGHT HERO GRAFT;  Surgeon: Katha Cabal, MD;  Location: [facility] ORS;  Service: Vascular;  Laterality: Right;   PERIPHERAL VASCULAR CATHETERIZATION N/A 12/20/2015   Procedure: Thrombectomy of dialysis access versus permcath placement;  Surgeon: Algernon Huxley, MD;  Location: [place] [facility];  Service: Cardiovascular;  Laterality: N/A;   PERIPHERAL VASCULAR CATHETERIZATION N/A 12/20/2015   Procedure: A/V Shunt Intervention;  Surgeon: Algernon Huxley, MD;  Location: [place] [facility];  Service: Cardiovascular;  Laterality: N/A;    PERIPHERAL VASCULAR CATHETERIZATION N/A 12/20/2015   Procedure: A/V Shuntogram/Fistulagram;  Surgeon: Algernon Huxley, MD;  Location: [place] [facility];  Service: Cardiovascular;  Laterality: N/A;   PERIPHERAL VASCULAR CATHETERIZATION N/A 01/02/2016   Procedure: A/V Shuntogram/Fistulagram;  Surgeon: Algernon Huxley, MD;  Location: [place] [facility];  Service: Cardiovascular;  Laterality: N/A;   PERIPHERAL VASCULAR CATHETERIZATION N/A 01/02/2016   Procedure: A/V Shunt Intervention;  Surgeon: Algernon Huxley, MD;  Location: [place] [facility];  Service: Cardiovascular;  Laterality: N/A;   TEE WITHOUT CARDIOVERSION N/A 06/11/2020   Procedure: TRANSESOPHAGEAL ECHOCARDIOGRAM (TEE);  Surgeon: Teodoro Spray, MD;  Location: [facility] ORS;  Service: Cardiovascular;  Laterality: N/A;   UPPER EXTREMITY ANGIOGRAPHY Bilateral 11/27/2020   Procedure: UPPER EXTREMITY ANGIOGRAPHY;  Surgeon: Katha Cabal, MD;  Location: [place] [facility];  Service: Cardiovascular;  Laterality: Bilateral;   UPPER EXTREMITY VENOGRAPHY Right 01/18/2020   Procedure: UPPER EXTREMITY VENOGRAPHY;  Surgeon: Katha Cabal, MD;  Location: [place] [facility];  Service: Cardiovascular;  Laterality: Right;   UPPER EXTREMITY VENOGRAPHY Bilateral 11/27/2020   Procedure: UPPER EXTREMITY VENOGRAPHY;  Surgeon: Katha Cabal, MD;  Location: [place] [facility];  Service: Cardiovascular;  Laterality: Bilateral;   VASCULAR ACCESS DEVICE INSERTION Right 04/13/2020   Procedure: INSERTION OF HERO VASCULAR ACCESS DEVICE (GRAFT);  Surgeon: Katha Cabal, MD;  Location: [facility] ORS;  Service: Vascular;  Laterality: Right;    Social History Social History   Tobacco Use   Smoking status: Never   Smokeless tobacco: Never  Vaping Use   Vaping Use: Never used  Substance Use Topics   Alcohol use: Not Currently    Comment: glass wine week per pt   Drug use: Yes    Types: Marijuana    Comment: once a day    Family History Family History   Problem Relation Age of Onset   Kidney disease Mother    Diabetes Mother    Cancer Father    Kidney disease Sister     Allergies  Allergen Reactions   Ace Inhibitors Swelling and Anaphylaxis   Ativan [Lorazepam] Other (See Comments)    Reaction:  Hallucinations and headaches   Compazine [Prochlorperazine Edisylate] Anaphylaxis, Nausea And Vomiting and Other (See Comments)    Other reaction(s): dystonia from this vs. Reglan, 23 Jul - patient relates that she takes promethazine frequently with no problems   Sumatriptan Succinate Other (See Comments)    Other reaction(s): delirium and hallucinations per [facility] records   Zofran [Ondansetron] Nausea And Vomiting  Per pt. she is allergic to zofran or will experience adverse reaction like hallucination    Losartan Nausea Only   Prochlorperazine Other (See Comments)    Reaction:  Unknown . Patient does not remember reaction but she does have vertigo and anxiety along with n and v at times. Could be used to treat any of these    Reglan [Metoclopramide] Other (See Comments)    Per patient her Dr. Evelina Bucy her off it    Scopolamine Other (See Comments)    Dizziness, also has vertigo already   Tape Rash    Plastic tape causes rash   Tapentadol Rash   Ultrasound Gel Itching    Patient states the Ultrasound gel caused itching while in skin contact and resolved when wiped away.     REVIEW OF SYSTEMS (Negative unless checked)  Constitutional: [] Weight loss  [] Fever  [] Chills Cardiac: [] Chest pain   [] Chest pressure   [] Palpitations   [] Shortness of breath when laying flat   [x] Shortness of breath with exertion. Vascular:  [] Pain in legs with walking   [] Pain in legs at rest  [] History of DVT   [] Phlebitis   [x] Swelling in legs   [] Varicose veins   [] Non-healing ulcers Pulmonary:   [] Uses home oxygen   [] Productive cough   [] Hemoptysis   [] Wheeze  [x] COPD   [] Asthma Neurologic:  [] Dizziness   [] Seizures   [] History of stroke   [] History of TIA   [] Aphasia   [] Vissual changes   [] Weakness or numbness in arm   [x] Weakness or numbness in leg Musculoskeletal:   [] Joint swelling   [] Joint pain   [] Low back pain Hematologic:  [] Easy bruising  [] Easy bleeding   [] Hypercoagulable state   [] Anemic Gastrointestinal:  [] Diarrhea   [] Vomiting  [x] Gastroesophageal reflux/heartburn   [] Difficulty swallowing. Genitourinary:  [x] Chronic kidney disease   [] Difficult urination  [] Frequent urination   [] Blood in urine Skin:  [] Rashes   [] Ulcers  Psychological:  [] History of anxiety   []  History of major depression.  Physical Examination  There were no vitals filed for this visit. There is no height or weight on file to calculate BMI. Gen: WD/WN, NAD Head: [place]/AT, No temporalis wasting.  Ear/Nose/Throat: Hearing grossly intact, nares w/o erythema or drainage, pinna without lesions Eyes: PER, EOMI, sclera nonicteric.  Neck: Supple, no gross masses.  No JVD.  Pulmonary:  Good air movement, no audible wheezing, no use of accessory muscles.  Cardiac: RRR, precordium not hyperdynamic. Vascular:  scattered varicosities present bilaterally.  Mild venous stasis changes to the legs bilaterally.  4+ soft pitting edema.  Catheter clean dry and intact Vessel Right Left  Radial Palpable Palpable  Gastrointestinal: soft, non-distended. No guarding/no peritoneal signs.  Musculoskeletal: M/S 5/5 throughout.  No deformity.  Neurologic: CN 2-12 intact. Pain and light touch intact in extremities.  Symmetrical.  Speech is fluent. Motor exam as listed above. Psychiatric: Judgment intact, Mood & affect appropriate for pt's clinical situation. Dermatologic: Venous rashes no ulcers noted.  No changes consistent with cellulitis. Lymph : No lichenification or skin changes of chronic lymphedema.  CBC Lab Results  Component Value Date   WBC 8.5 09/25/2021   HGB 9.6 (L) 09/25/2021   HCT 28.8 (L) 09/25/2021   MCV 88.3 09/25/2021   PLT 161 09/25/2021    BMET     Component Value Date/Time   NA 129 (L) 09/25/2021 1400   NA 135 (L) 09/15/2014 0948   K 4.2 09/25/2021 1400   K 4.8 09/15/2014 0948  CL 89 (L) 09/25/2021 1400   CL 99 09/15/2014 0948   CO2 23 09/25/2021 1400   CO2 26 09/15/2014 0948   GLUCOSE 216 (H) 09/25/2021 1400   GLUCOSE 118 (H) 09/15/2014 0948   BUN 66 (H) 09/25/2021 1400   BUN 19 (H) 09/15/2014 0948   CREATININE 7.48 (H) 09/25/2021 1400   CREATININE 6.79 (H) 09/15/2014 0948   CALCIUM 8.0 (L) 09/25/2021 1400   CALCIUM 8.3 (L) 09/15/2014 0948   GFRNONAA 6 (L) 09/25/2021 1400   GFRNONAA 7 (L) 09/15/2014 0948   GFRNONAA 6 (L) 05/31/2014 0432   GFRAA 11 (L) 06/25/2020 0641   GFRAA 8 (L) 09/15/2014 0948   GFRAA 7 (L) 05/31/2014 0432   CrCl cannot be calculated (Unknown ideal weight.).  COAG Lab Results  Component Value Date   INR 1.2 04/25/2021   INR 1.3 (H) 03/29/2021   INR 1.3 (H) 03/17/2021    Radiology No results found.   Assessment/Plan 1. ESRD on hemodialysis [facility]) Recommend:  The patient is experiencing increasing problems with their dialysis access.  Patient should have a left arm venogram to see if a HERO graft can be placed  The risks, benefits and alternative therapies were reviewed in detail with the patient.  All questions were answered.  The patient agrees to proceed with angio/intervention.      2. Lymphedema Recommend:  No surgery or intervention at this point in time.    I have reviewed my previous discussion with the patient regarding swelling and why it causes symptoms.  Patient will continue wearing graduated compression stockings class 1 (20-30 mmHg) on a daily basis. The patient will  beginning wearing the stockings first thing in the morning and removing them in the evening. The patient is instructed specifically not to sleep in the stockings.    In addition, behavioral modification including several periods of elevation of the lower extremities during the day will be continued.  This  was reviewed with the patient during the initial visit.  The patient will also continue routine exercise, especially walking on a daily basis as was discussed during the initial visit.    Despite conservative treatments including graduated compression therapy class 1 and behavioral modification including exercise and elevation the patient  has not obtained adequate control of the lymphedema.  The patient still has stage 3 lymphedema and therefore, I believe that a lymph pump should be added to improve the control of the patient's lymphedema.  Additionally, a lymph pump is warranted because it will reduce the risk of cellulitis and ulceration in the future.  Patient should follow-up in six months    3. Chronic venous insufficiency Recommend:  No surgery or intervention at this point in time.    I have reviewed my previous discussion with the patient regarding swelling and why it causes symptoms.  Patient will continue wearing graduated compression stockings class 1 (20-30 mmHg) on a daily basis. The patient will  beginning wearing the stockings first thing in the morning and removing them in the evening. The patient is instructed specifically not to sleep in the stockings.    In addition, behavioral modification including several periods of elevation of the lower extremities during the day will be continued.  This was reviewed with the patient during the initial visit.  The patient will also continue routine exercise, especially walking on a daily basis as was discussed during the initial visit.    Despite conservative treatments including graduated compression therapy class 1 and behavioral modification  including exercise and elevation the patient  has not obtained adequate control of the lymphedema.  The patient still has stage 3 lymphedema and therefore, I believe that a lymph pump should be added to improve the control of the patient's lymphedema.  Additionally, a lymph pump is warranted  because it will reduce the risk of cellulitis and ulceration in the future.  Patient should follow-up in six months    4. Coronary artery disease involving native coronary artery of native heart without angina pectoris Continue cardiac and antihypertensive medications as already ordered and reviewed, no changes at this time.  Continue statin as ordered and reviewed, no changes at this time  Nitrates PRN for chest pain   5. Primary hypertension Continue antihypertensive medications as already ordered, these medications have been reviewed and there are no changes at this time.   6. Chronic obstructive pulmonary disease, unspecified COPD type ([place]) Continue pulmonary medications and aerosols as already ordered, these medications have been reviewed and there are no changes at this time.      Hortencia Pilar, MD  10/16/2021 1:11 PM

## 2021-10-17 ENCOUNTER — Encounter (INDEPENDENT_AMBULATORY_CARE_PROVIDER_SITE_OTHER): Payer: Self-pay

## 2021-10-17 ENCOUNTER — Ambulatory Visit (INDEPENDENT_AMBULATORY_CARE_PROVIDER_SITE_OTHER): Payer: Medicare Other

## 2021-10-17 ENCOUNTER — Ambulatory Visit (INDEPENDENT_AMBULATORY_CARE_PROVIDER_SITE_OTHER): Payer: Medicare Other | Admitting: Vascular Surgery

## 2021-10-17 ENCOUNTER — Other Ambulatory Visit: Payer: Self-pay

## 2021-10-17 ENCOUNTER — Other Ambulatory Visit (INDEPENDENT_AMBULATORY_CARE_PROVIDER_SITE_OTHER): Payer: Self-pay | Admitting: Vascular Surgery

## 2021-10-17 VITALS — BP 183/70 | HR 68 | Ht 62.0 in | Wt 162.0 lb

## 2021-10-17 DIAGNOSIS — I890 Lymphedema, not elsewhere classified: Secondary | ICD-10-CM

## 2021-10-17 DIAGNOSIS — I10 Essential (primary) hypertension: Secondary | ICD-10-CM

## 2021-10-17 DIAGNOSIS — I872 Venous insufficiency (chronic) (peripheral): Secondary | ICD-10-CM | POA: Diagnosis not present

## 2021-10-17 DIAGNOSIS — N186 End stage renal disease: Secondary | ICD-10-CM

## 2021-10-17 DIAGNOSIS — M7989 Other specified soft tissue disorders: Secondary | ICD-10-CM

## 2021-10-17 DIAGNOSIS — J449 Chronic obstructive pulmonary disease, unspecified: Secondary | ICD-10-CM

## 2021-10-17 DIAGNOSIS — Z992 Dependence on renal dialysis: Secondary | ICD-10-CM

## 2021-10-17 DIAGNOSIS — I2510 Atherosclerotic heart disease of native coronary artery without angina pectoris: Secondary | ICD-10-CM

## 2021-10-18 ENCOUNTER — Telehealth (INDEPENDENT_AMBULATORY_CARE_PROVIDER_SITE_OTHER): Payer: Self-pay

## 2021-10-18 NOTE — Telephone Encounter (Signed)
Patient returned my call and is now scheduled with Dr. Delana Meyer for a left arm venogram on 11/05/21 with a 8:00 am arrival time to the [facility]. Pre-procedure instructions were discussed and will be mailed.

## 2021-10-18 NOTE — Telephone Encounter (Signed)
I attempted to contact the patient to schedule a left arm venogram with Dr. Delana Meyer. A message was left for a return call.

## 2021-10-20 ENCOUNTER — Encounter (INDEPENDENT_AMBULATORY_CARE_PROVIDER_SITE_OTHER): Payer: Self-pay | Admitting: Vascular Surgery

## 2021-10-30 NOTE — Progress Notes (Incomplete)
10/30/21 11:57 AM   [name] 07/08/1961 [id number]  Referring provider:  Denton Lank, MD [address] [place],  [place] [address] No chief complaint on file.    HPI: [name] is a 61 y.o.female  with multiple medical comorbidities including end-stage renal disease on dialysis, COPD, renal mass, and CHF who presents today for follow-up per cancer center.   CT scan from 12/07/2018 shows interval growth of the lesion since 2018, the right medial upper pole lesion now measures 14 x 13 x 12.5 mm, up from 11.5 x 10.5 x 9.5 in 2019. In addition, an enhancing upper mid pole posterior lesion now measures 12.0 x 10.5 mm x 15 mm, up from 11.5 x10.0 x 13 mm.   CT abdomen and pelvis on 04/25/2021 visualized atrophic native kidneys without hydronephrosis. Stable cyst in the mid left kidney. The bladder is nearly empty.    PMH: Past Medical History:  Diagnosis Date   Anemia    Anginal pain ([place])    Anxiety    Arthritis    Asthma    Broken wrist    Bronchitis    chronic diastolic CHF 0/38/8828   Chronic kidney disease    COPD (chronic obstructive pulmonary disease) ([place])    Coronary artery disease    a. cath 2013: stenting to RCA (report not available); b. cath 2014: LM nl, pLAD 40%, mLAD nl, ost LCx 40%, mid LCx nl, pRCA 30% @ site of prior stent, mRCA 50%   Depression    Diabetes mellitus ([place])    Diabetes mellitus without complication ([place])    Diabetic neuropathy ([place])    dialysis 2006   Diverticulosis    Dizziness    Dyspnea    Elevated lipids    Environmental and seasonal allergies    ESRD (end stage renal disease) on dialysis ([place])    M-W-F   Gastroparesis    GERD (gastroesophageal reflux disease)    Headache    History of anemia due to chronic kidney disease    History of hiatal hernia    HOH (hard of hearing)    Hx of pancreatitis 2015   Hypertension    Lower extremity edema    Mitral regurgitation    a. echo 10/2013: EF 62%, noWMA, mildly dilated  LA, mild to mod MR/TR, GR1DD   Myocardial infarction ([place])    Orthopnea    Parathyroid abnormality ([name])    Peripheral arterial disease ([place])    Pneumonia    Renal cancer ([place])    Renal insufficiency    Pt is on dialysis on M,W + F.   Wheezing     Surgical History: Past Surgical History:  Procedure Laterality Date   A/V SHUNTOGRAM Left 01/20/2018   Procedure: A/V SHUNTOGRAM;  Surgeon: Algernon Huxley, MD;  Location: [place] [facility];  Service: Cardiovascular;  Laterality: Left;   ABDOMINAL HYSTERECTOMY  1992   AMPUTATION TOE Left 10/02/2017   Procedure: AMPUTATION TOE-LEFT GREAT TOE;  Surgeon: Albertine Patricia, DPM;  Location: [facility];  Service: Podiatry;  Laterality: Left;   APPENDECTOMY     APPLICATION OF WOUND VAC N/A 11/25/2019   Procedure: APPLICATION OF WOUND VAC;  Surgeon: Katha Cabal, MD;  Location: [facility];  Service: Vascular;  Laterality: N/A;   ARTERY BIOPSY Right 10/11/2018   Procedure: BIOPSY TEMPORAL ARTERY;  Surgeon: Vickie Epley, MD;  Location: [facility];  Service: General;  Laterality: Right;   CARDIAC CATHETERIZATION Left 07/26/2015  Procedure: Left Heart Cath and Coronary Angiography;  Surgeon: Dionisio David, MD;  Location: [place] [facility];  Service: Cardiovascular;  Laterality: Left;   CATARACT EXTRACTION W/ INTRAOCULAR LENS IMPLANT Right    CATARACT EXTRACTION W/PHACO Left 03/10/2017   Procedure: CATARACT EXTRACTION PHACO AND INTRAOCULAR LENS PLACEMENT ([place]);  Surgeon: Birder Robson, MD;  Location: [facility];  Service: Ophthalmology;  Laterality: Left;  [place] 00:51.9 AP% 14.2 CDE 7.39 fluid pack lot # [id number]   CENTRAL LINE INSERTION Right 11/11/2019   Procedure: CENTRAL LINE INSERTION;  Surgeon: Katha Cabal, MD;  Location: [facility];  Service: Vascular;  Laterality: Right;   CENTRAL LINE INSERTION  11/25/2019   Procedure: CENTRAL LINE INSERTION;  Surgeon: Katha Cabal, MD;  Location: [facility];  Service: Vascular;;   CHOLECYSTECTOMY      COLONOSCOPY WITH PROPOFOL N/A 08/12/2016   Procedure: COLONOSCOPY WITH PROPOFOL;  Surgeon: Lollie Sails, MD;  Location: [facility];  Service: Endoscopy;  Laterality: N/A;   DIALYSIS FISTULA CREATION Left    upper arm   dialysis grafts     DIALYSIS/PERMA CATHETER INSERTION N/A 11/14/2019   Procedure: DIALYSIS/PERMA CATHETER INSERTION;  Surgeon: Algernon Huxley, MD;  Location: [place] [facility];  Service: Cardiovascular;  Laterality: N/A;   DIALYSIS/PERMA CATHETER INSERTION N/A 02/03/2020   Procedure: DIALYSIS/PERMA CATHETER INSERTION;  Surgeon: Katha Cabal, MD;  Location: [place] [facility];  Service: Cardiovascular;  Laterality: N/A;   DIALYSIS/PERMA CATHETER INSERTION N/A 06/19/2020   Procedure: DIALYSIS/PERMA CATHETER INSERTION;  Surgeon: Katha Cabal, MD;  Location: [place] [facility];  Service: Cardiovascular;  Laterality: N/A;   DIALYSIS/PERMA CATHETER INSERTION N/A 05/07/2021   Procedure: DIALYSIS/PERMA CATHETER INSERTION;  Surgeon: Katha Cabal, MD;  Location: [place] [facility];  Service: Cardiovascular;  Laterality: N/A;  Patient needs general anethesia   DIALYSIS/PERMA CATHETER REMOVAL N/A 05/25/2020   Procedure: DIALYSIS/PERMA CATHETER REMOVAL;  Surgeon: Katha Cabal, MD;  Location: [place] [facility];  Service: Cardiovascular;  Laterality: N/A;   ESOPHAGOGASTRODUODENOSCOPY N/A 03/08/2015   Procedure: ESOPHAGOGASTRODUODENOSCOPY (EGD);  Surgeon: Manya Silvas, MD;  Location: [facility];  Service: Endoscopy;  Laterality: N/A;   ESOPHAGOGASTRODUODENOSCOPY (EGD) WITH PROPOFOL N/A 03/18/2016   Procedure: ESOPHAGOGASTRODUODENOSCOPY (EGD) WITH PROPOFOL;  Surgeon: [name], MD;  Location: [facility];  Service: Endoscopy;  Laterality: N/A;   EYE SURGERY Right 2018   FECAL TRANSPLANT N/A 08/23/2015   Procedure: FECAL TRANSPLANT;  Surgeon: Manya Silvas, MD;  Location: [facility] END[facility]COPY;  Service: Endoscopy;  Laterality: N/A;   HAND SURGERY  Bilateral    HEMATOMA EVACUATION Left 11/25/2019   Procedure: EVACUATION HEMATOMA;  Surgeon: Katha Cabal, MD;  Location: [facility];  Service: Vascular;  Laterality: Left;   I & D EXTREMITY Left 11/25/2019   Procedure: IRRIGATION AND DEBRIDEMENT EXTREMITY;  Surgeon: Katha Cabal, MD;  Location: [facility];  Service: Vascular;  Laterality: Left;   IR FLUORO GUIDE CV LINE RIGHT  04/06/2020   IR INJECT/THERA/INC NEEDLE/CATH/PLC EPI/CERV/THOR W/IMG  08/13/2020   IR RADIOLOGIST EVAL & MGMT  07/28/2019   IR RADIOLOGIST EVAL & MGMT  08/11/2019   LIGATION OF ARTERIOVENOUS  FISTULA Left 11/11/2019   LIGATION OF ARTERIOVENOUS  FISTULA Left 11/11/2019   Procedure: LIGATION OF ARTERIOVENOUS  FISTULA;  Surgeon: Katha Cabal, MD;  Location: [facility];  Service: Vascular;  Laterality: Left;   LIGATIONS OF HERO GRAFT Right 06/13/2020   Procedure: LIGATION / REMOVAL OF RIGHT HERO GRAFT;  Surgeon:  Schnier, Dolores Lory, MD;  Location: [facility];  Service: Vascular;  Laterality: Right;   PERIPHERAL VASCULAR CATHETERIZATION N/A 12/20/2015   Procedure: Thrombectomy of dialysis access versus permcath placement;  Surgeon: Algernon Huxley, MD;  Location: [place] [facility];  Service: Cardiovascular;  Laterality: N/A;   PERIPHERAL VASCULAR CATHETERIZATION N/A 12/20/2015   Procedure: A/V Shunt Intervention;  Surgeon: Algernon Huxley, MD;  Location: [place] [facility];  Service: Cardiovascular;  Laterality: N/A;   PERIPHERAL VASCULAR CATHETERIZATION N/A 12/20/2015   Procedure: A/V Shuntogram/Fistulagram;  Surgeon: Algernon Huxley, MD;  Location: [place] [facility];  Service: Cardiovascular;  Laterality: N/A;   PERIPHERAL VASCULAR CATHETERIZATION N/A 01/02/2016   Procedure: A/V Shuntogram/Fistulagram;  Surgeon: Algernon Huxley, MD;  Location: [place] [facility];  Service: Cardiovascular;  Laterality: N/A;   PERIPHERAL VASCULAR CATHETERIZATION N/A 01/02/2016   Procedure: A/V Shunt Intervention;  Surgeon: Algernon Huxley, MD;  Location:  [place] [facility];  Service: Cardiovascular;  Laterality: N/A;   TEE WITHOUT CARDIOVERSION N/A 06/11/2020   Procedure: TRANSESOPHAGEAL ECHOCARDIOGRAM (TEE);  Surgeon: Teodoro Spray, MD;  Location: [facility];  Service: Cardiovascular;  Laterality: N/A;   UPPER EXTREMITY ANGIOGRAPHY Bilateral 11/27/2020   Procedure: UPPER EXTREMITY ANGIOGRAPHY;  Surgeon: Katha Cabal, MD;  Location: [place] [facility];  Service: Cardiovascular;  Laterality: Bilateral;   UPPER EXTREMITY VENOGRAPHY Right 01/18/2020   Procedure: UPPER EXTREMITY VENOGRAPHY;  Surgeon: Katha Cabal, MD;  Location: [place] [facility];  Service: Cardiovascular;  Laterality: Right;   UPPER EXTREMITY VENOGRAPHY Bilateral 11/27/2020   Procedure: UPPER EXTREMITY VENOGRAPHY;  Surgeon: Katha Cabal, MD;  Location: [place] [facility];  Service: Cardiovascular;  Laterality: Bilateral;   VASCULAR ACCESS DEVICE INSERTION Right 04/13/2020   Procedure: INSERTION OF HERO VASCULAR ACCESS DEVICE (GRAFT);  Surgeon: Katha Cabal, MD;  Location: [facility];  Service: Vascular;  Laterality: Right;    Home Medications:  Allergies as of 10/31/2021       Reactions   Ace Inhibitors Swelling, Anaphylaxis   Ativan [lorazepam] Other (See Comments)   Reaction:  Hallucinations and headaches   Compazine [prochlorperazine Edisylate] Anaphylaxis, Nausea And Vomiting, Other (See Comments)   Other reaction(s): dystonia from this vs. Reglan, 23 Jul - patient relates that she takes promethazine frequently with no problems   Sumatriptan Succinate Other (See Comments)   Other reaction(s): delirium and hallucinations per [facility] records   Zofran [ondansetron] Nausea And Vomiting   Per pt. she is allergic to zofran or will experience adverse reaction like hallucination    Losartan Nausea Only   Prochlorperazine Other (See Comments)   Reaction:  Unknown . Patient does not remember reaction but she does have vertigo and anxiety along with n and v at  times. Could be used to treat any of these   Reglan [metoclopramide] Other (See Comments)   Per patient her Dr. Evelina Bucy her off it    Scopolamine Other (See Comments)   Dizziness, also has vertigo already   Tape Rash   Plastic tape causes rash   Tapentadol Rash   Ultrasound Gel Itching   Patient states the Ultrasound gel caused itching while in skin contact and resolved when wiped away.        Medication List        Accurate as of October 30, 2021 11:57 AM. If you have any questions, ask your nurse or doctor.          albuterol (2.5 MG/3ML) 0.083%  nebulizer solution Commonly known as: PROVENTIL Take 2.5 mg by nebulization every 4 (four) hours as needed for wheezing or shortness of breath.   albuterol 108 (90 Base) MCG/ACT inhaler Commonly known as: VENTOLIN HFA Inhale 2 puffs into the lungs every 4 (four) hours as needed for wheezing or shortness of breath.   alum & mag hydroxide-simeth 200-200-20 MG/5ML suspension Commonly known as: MAALOX/MYLANTA Take 15 mLs by mouth every 6 (six) hours as needed for indigestion or heartburn.   amLODipine 10 MG tablet Commonly known as: NORVASC Take 1 tablet (10 mg total) by mouth daily.   ammonium lactate 12 % lotion Commonly known as: LAC-HYDRIN Apply 1 application topically 2 (two) times daily as needed for dry skin.   aspirin EC 81 MG tablet Take 81 mg by mouth daily.   atorvastatin 20 MG tablet Commonly known as: LIPITOR Take 20 mg by mouth every evening.   Biotin 1 MG Caps Take 1 mg by mouth daily.   calcium carbonate 500 MG chewable tablet Commonly known as: TUMS - dosed in mg elemental calcium Chew 2.5 tablets (500 mg of elemental calcium total) by mouth every 6 (six) hours as needed for indigestion.   cyanocobalamin 1000 MCG tablet Take 1,000 mcg by mouth daily.   diphenhydrAMINE 25 MG tablet Commonly known as: BENADRYL Take 25 mg by mouth every 6 (six) hours as needed for itching.   epoetin alfa 10000 UNIT/ML  injection Commonly known as: EPOGEN Inject 1 mL (10,000 Units total) into the vein every Monday, Wednesday, and Friday with hemodialysis.   feeding supplement (NEPRO CARB STEADY) Liqd Take 237 mLs by mouth 2 (two) times daily between meals.   gabapentin 100 MG capsule Commonly known as: NEURONTIN Take 100 mg by mouth at bedtime.   HYDROcodone-acetaminophen 5-325 MG tablet Commonly known as: NORCO/VICODIN Take 1 tablet by mouth 3 (three) times daily as needed for moderate pain or severe pain.   hydrOXYzine 25 MG tablet Commonly known as: ATARAX Take 25 mg by mouth daily as needed for itching.   Iron 325 (65 Fe) MG Tabs Take 325 mg by mouth daily.   lidocaine 5 % Commonly known as: LIDODERM Place 1 patch onto the skin daily. (Remove after 12 hours -- leave off for 12 hours before applying new patch)   meclizine 25 MG tablet Commonly known as: ANTIVERT Take 25 mg by mouth every 6 (six) hours as needed for dizziness.   mirtazapine 7.5 MG tablet Commonly known as: REMERON Take 7.5 mg by mouth daily.   multivitamin Tabs tablet Take 1 tablet by mouth daily.   mupirocin ointment 2 % Commonly known as: BACTROBAN Apply 1 application topically daily as needed (leg rash).   nitroGLYCERIN 0.4 MG SL tablet Commonly known as: NITROSTAT Place 0.4 mg under the tongue every 5 (five) minutes as needed for chest pain.   pantoprazole 40 MG tablet Commonly known as: PROTONIX Take 40 mg by mouth daily.   polyethylene glycol 17 g packet Commonly known as: MIRALAX / GLYCOLAX Take 17 g by mouth daily as needed. What changed: reasons to take this   PROBIOTIC PO Take 1 capsule by mouth daily.   promethazine 12.5 MG tablet Commonly known as: PHENERGAN Take 1 tablet (12.5 mg total) by mouth every 6 (six) hours as needed for nausea or vomiting.   sevelamer carbonate 800 MG tablet Commonly known as: RENVELA Take 800 mg by mouth 4 (four) times daily. (Take with meals and snack)    Vitamin D3  25 MCG (1000 UT) Caps Take 1,000 Units by mouth daily.   vitamin E 180 MG (400 UNITS) capsule Take 400 Units by mouth daily.        Allergies:  Allergies  Allergen Reactions   Ace Inhibitors Swelling and Anaphylaxis   Ativan [Lorazepam] Other (See Comments)    Reaction:  Hallucinations and headaches   Compazine [Prochlorperazine Edisylate] Anaphylaxis, Nausea And Vomiting and Other (See Comments)    Other reaction(s): dystonia from this vs. Reglan, 23 Jul - patient relates that she takes promethazine frequently with no problems   Sumatriptan Succinate Other (See Comments)    Other reaction(s): delirium and hallucinations per [facility] records   Zofran [Ondansetron] Nausea And Vomiting    Per pt. she is allergic to zofran or will experience adverse reaction like hallucination    Losartan Nausea Only   Prochlorperazine Other (See Comments)    Reaction:  Unknown . Patient does not remember reaction but she does have vertigo and anxiety along with n and v at times. Could be used to treat any of these    Reglan [Metoclopramide] Other (See Comments)    Per patient her Dr. Evelina Bucy her off it    Scopolamine Other (See Comments)    Dizziness, also has vertigo already   Tape Rash    Plastic tape causes rash   Tapentadol Rash   Ultrasound Gel Itching    Patient states the Ultrasound gel caused itching while in skin contact and resolved when wiped away.    Family History: Family History  Problem Relation Age of Onset   Kidney disease Mother    Diabetes Mother    Cancer Father    Kidney disease Sister     Social History:  reports that she has never smoked. She has never used smokeless tobacco. She reports that she does not currently use alcohol. She reports current drug use. Drug: Marijuana.   Physical Exam: There were no vitals taken for this visit.  Constitutional:  Alert and oriented, No acute distress. HEENT: [place] AT, moist mucus membranes.  Trachea midline, no  masses. Cardiovascular: No clubbing, cyanosis, or edema. Respiratory: Normal respiratory effort, no increased work of breathing. Skin: No rashes, bruises or suspicious lesions. Neurologic: Grossly intact, no focal deficits, moving all 4 extremities. Psychiatric: Normal mood and affect.  Laboratory Data: Lab Results  Component Value Date   CREATININE 7.48 (H) 09/25/2021   Lab Results  Component Value Date   HGBA1C 6.2 (H) 03/29/2021   Urinalysis   Pertinent Imaging: CLINICAL DATA:  Diffuse abdominal pain   EXAM: CT ABDOMEN AND PELVIS WITHOUT CONTRAST   TECHNIQUE: Multidetector CT imaging of the abdomen and pelvis was performed following the standard protocol without IV contrast.   COMPARISON:  CT 06/16/2020   FINDINGS: Lower chest: Lung bases demonstrate small bilateral pleural effusions and cardiomegaly. Hazy lung densities at the bases likely due to edema. Atelectasis at both bases.   Hepatobiliary: Status post cholecystectomy. No focal hepatic abnormality. No biliary dilatation.   Pancreas: Unremarkable. No pancreatic ductal dilatation or surrounding inflammatory changes.   Spleen: Normal in size without focal abnormality.   Adrenals/Urinary Tract: Adrenal glands are normal. Atrophic native kidneys without hydronephrosis. Stable cyst in the mid left kidney. The bladder is nearly empty   Stomach/Bowel: The stomach is nonenlarged. No dilated small bowel. No acute bowel wall thickening. Diverticular disease of left colon without acute wall thickening   Vascular/Lymphatic: Extensive vascular calcification. Mild retroperitoneal lymph nodes measuring up to 12  mm   Reproductive: Status post hysterectomy. No adnexal masses.   Other: Negative for free air. Small free fluid within the abdomen and pelvis. Extensive edema within the subcutaneous soft tissues.   Musculoskeletal: No acute osseous abnormality   IMPRESSION: 1. Cardiomegaly with small bilateral pleural  effusions and hazy lung densities likely due to edema. Small amount of abdominopelvic ascites. Extensive edema within the subcutaneous soft tissues consistent with anasarca. 2. Native atrophic kidneys without hydronephrosis 3. Negative for bowel obstruction or bowel wall thickening. Left colon diverticular disease without acute wall thickening     Electronically Signed   By: Donavan Foil M.D.   On: 04/25/2021 20:38   Assessment & Plan:     No follow-ups on file.  I,Kailey Littlejohn,acting as a Education administrator for Hollice Espy, MD.,have documented all relevant documentation on the behalf of Hollice Espy, MD,as directed by  Hollice Espy, MD while in the presence of Hollice Espy, [place] [address] [place], [place] [address] [phone]

## 2021-10-31 ENCOUNTER — Other Ambulatory Visit: Payer: Self-pay

## 2021-10-31 ENCOUNTER — Ambulatory Visit: Payer: Medicare HMO | Admitting: Urology

## 2021-11-04 ENCOUNTER — Other Ambulatory Visit (INDEPENDENT_AMBULATORY_CARE_PROVIDER_SITE_OTHER): Payer: Self-pay | Admitting: Nurse Practitioner

## 2021-11-04 DIAGNOSIS — N186 End stage renal disease: Secondary | ICD-10-CM

## 2021-11-04 DIAGNOSIS — Z992 Dependence on renal dialysis: Secondary | ICD-10-CM

## 2021-11-05 ENCOUNTER — Other Ambulatory Visit: Payer: Self-pay

## 2021-11-05 ENCOUNTER — Encounter
Admission: RE | Disposition: A | Discharge status: Home / Self Care | Payer: Self-pay | Source: Home / Self Care | Attending: Vascular Surgery

## 2021-11-05 ENCOUNTER — Ambulatory Visit
Admission: RE | Admit: 2021-11-05 | Discharge: 2021-11-05 | Disposition: A | Discharge status: Home / Self Care | Payer: Medicare Other | Attending: Vascular Surgery | Admitting: Vascular Surgery

## 2021-11-05 ENCOUNTER — Encounter: Payer: Self-pay | Admitting: Vascular Surgery

## 2021-11-05 DIAGNOSIS — N186 End stage renal disease: Secondary | ICD-10-CM | POA: Diagnosis not present

## 2021-11-05 DIAGNOSIS — I2510 Atherosclerotic heart disease of native coronary artery without angina pectoris: Secondary | ICD-10-CM | POA: Insufficient documentation

## 2021-11-05 DIAGNOSIS — I872 Venous insufficiency (chronic) (peripheral): Secondary | ICD-10-CM | POA: Insufficient documentation

## 2021-11-05 DIAGNOSIS — E1122 Type 2 diabetes mellitus with diabetic chronic kidney disease: Secondary | ICD-10-CM | POA: Insufficient documentation

## 2021-11-05 DIAGNOSIS — I890 Lymphedema, not elsewhere classified: Secondary | ICD-10-CM | POA: Insufficient documentation

## 2021-11-05 DIAGNOSIS — I871 Compression of vein: Secondary | ICD-10-CM | POA: Diagnosis not present

## 2021-11-05 DIAGNOSIS — J449 Chronic obstructive pulmonary disease, unspecified: Secondary | ICD-10-CM | POA: Diagnosis not present

## 2021-11-05 DIAGNOSIS — N185 Chronic kidney disease, stage 5: Secondary | ICD-10-CM | POA: Diagnosis not present

## 2021-11-05 DIAGNOSIS — I132 Hypertensive heart and chronic kidney disease with heart failure and with stage 5 chronic kidney disease, or end stage renal disease: Secondary | ICD-10-CM | POA: Insufficient documentation

## 2021-11-05 DIAGNOSIS — Z992 Dependence on renal dialysis: Secondary | ICD-10-CM | POA: Diagnosis not present

## 2021-11-05 DIAGNOSIS — I5032 Chronic diastolic (congestive) heart failure: Secondary | ICD-10-CM | POA: Diagnosis not present

## 2021-11-05 HISTORY — PX: UPPER EXTREMITY VENOGRAPHY: CATH118272

## 2021-11-05 LAB — GLUCOSE, CAPILLARY
Glucose-Capillary: 129 mg/dL — ABNORMAL HIGH (ref 70–99)
Glucose-Capillary: 112 mg/dL — ABNORMAL HIGH (ref 70–99)

## 2021-11-05 LAB — POTASSIUM (ARMC VASCULAR LAB ONLY): Potassium (ARMC vascular lab): 3.9 (ref 3.5–5.1)

## 2021-11-05 SURGERY — UPPER EXTREMITY VENOGRAPHY
Anesthesia: Moderate Sedation | Laterality: Left

## 2021-11-05 MED ORDER — SODIUM CHLORIDE 0.9 % IV SOLN: INTRAVENOUS | Status: DC

## 2021-11-05 MED ORDER — HEPARIN SODIUM (PORCINE) 1000 UNIT/ML IJ SOLN
INTRAMUSCULAR | Status: AC
  Filled 2021-11-05: qty 10

## 2021-11-05 MED ORDER — DIPHENHYDRAMINE HCL 50 MG/ML IJ SOLN: 50.0000 mg | Freq: Once | INTRAMUSCULAR | Status: DC | PRN

## 2021-11-05 MED ORDER — DIPHENHYDRAMINE HCL 50 MG/ML IJ SOLN
INTRAMUSCULAR | Status: DC | PRN
  Administered 2021-11-05: 50 mg via INTRAVENOUS

## 2021-11-05 MED ORDER — FENTANYL CITRATE PF 50 MCG/ML IJ SOSY
PREFILLED_SYRINGE | INTRAMUSCULAR | Status: AC
  Filled 2021-11-05: qty 2

## 2021-11-05 MED ORDER — FAMOTIDINE 20 MG PO TABS: 40.0000 mg | ORAL_TABLET | Freq: Once | ORAL | Status: DC | PRN

## 2021-11-05 MED ORDER — MIDAZOLAM HCL 2 MG/ML PO SYRP: 8.0000 mg | ORAL_SOLUTION | Freq: Once | ORAL | Status: DC | PRN

## 2021-11-05 MED ORDER — CEFAZOLIN SODIUM-DEXTROSE 1-4 GM/50ML-% IV SOLN
INTRAVENOUS | Status: AC
  Administered 2021-11-05: 1 g via INTRAVENOUS
  Filled 2021-11-05: qty 50

## 2021-11-05 MED ORDER — HYDROMORPHONE HCL 1 MG/ML IJ SOLN: 1.0000 mg | Freq: Once | INTRAMUSCULAR | Status: DC | PRN

## 2021-11-05 MED ORDER — FENTANYL CITRATE (PF) 100 MCG/2ML IJ SOLN
INTRAMUSCULAR | Status: DC | PRN
  Administered 2021-11-05: 50 ug via INTRAVENOUS

## 2021-11-05 MED ORDER — CEFAZOLIN SODIUM-DEXTROSE 1-4 GM/50ML-% IV SOLN: 1.0000 g | Freq: Once | INTRAVENOUS | Status: AC

## 2021-11-05 MED ORDER — METHYLPREDNISOLONE SODIUM SUCC 125 MG IJ SOLR: 125.0000 mg | Freq: Once | INTRAMUSCULAR | Status: DC | PRN

## 2021-11-05 MED ORDER — DIPHENHYDRAMINE HCL 50 MG/ML IJ SOLN
INTRAMUSCULAR | Status: AC
  Filled 2021-11-05: qty 1

## 2021-11-05 MED ORDER — MIDAZOLAM HCL 2 MG/2ML IJ SOLN
INTRAMUSCULAR | Status: DC | PRN
  Administered 2021-11-05: 2 mg via INTRAVENOUS

## 2021-11-05 MED ORDER — MIDAZOLAM HCL 2 MG/2ML IJ SOLN
INTRAMUSCULAR | Status: AC
  Filled 2021-11-05: qty 4

## 2021-11-05 SURGICAL SUPPLY — 10 items
CANNULA 5F STIFF (CANNULA) ×1 IMPLANT
CHLORAPREP W/TINT 26 (MISCELLANEOUS) ×1 IMPLANT
COVER PROBE U/S 5X48 (MISCELLANEOUS) ×2 IMPLANT
DEVICE TORQUE .025-.038 (MISCELLANEOUS) ×1 IMPLANT
DRAPE BRACHIAL (DRAPES) ×3 IMPLANT
GLIDEWIRE ADV .035X180CM (WIRE) ×1 IMPLANT
PACK ANGIOGRAPHY (CUSTOM PROCEDURE TRAY) ×1 IMPLANT
SHEATH BRITE TIP 6FRX5.5 (SHEATH) ×1 IMPLANT
SUT MNCRL AB 4-0 PS2 18 (SUTURE) ×1 IMPLANT
TOWEL OR 17X26 4PK STRL BLUE (TOWEL DISPOSABLE) ×2 IMPLANT

## 2021-11-05 NOTE — Op Note (Signed)
[facility]  Percutaneous Study/Intervention Procedural Note   Date of Surgery: 11/05/2021,10:25 AM  Surgeon:Reuel Lamadrid, Dolores Lory   Pre-operative Diagnosis: Superior vena cava syndrome; end-stage renal disease requiring hemodialysis; complication of vesicular access  Post-operative diagnosis:  Same  Procedure(s) Performed:  1.  Ultrasound-guided access to antecubital vein left arm  2.  Contrast injection left arm venogram with central imaging  3.  Ultrasound-guided access to the left internal jugular vein  4.  Ultrasound-guided access to left subclavian collateral  5.  Contrast imaging left internal jugular vein  6.  Contrast imaging left subclavian and innominate vein    Anesthesia: Conscious sedation was administered by the interventional radiology RN under my direct supervision. IV Versed plus fentanyl were utilized. Continuous ECG, pulse oximetry and blood pressure was monitored throughout the entire procedure. Conscious sedation was administered for a total of 45 minutes.  Sheath: 6 French sheath left antecubital vein antegrade; microneedle left internal jugular vein; micro sheath left subclavian vein  Contrast: 20 cc   Fluoroscopy Time: 4.3 minutes  Indications: Patient has end-stage renal disease and known superior vena cava syndrome.  She has had multiple failed accesses of both upper extremities.  At this point the hope would be for access to her central venous system from a left-sided approach so that a hero graft could be created.  Previous hero graft on the right was removed day of surgery secondary to steal phenomena.  Procedure:  [name] a 61 y.o. female who was identified and appropriate procedural time out was performed.  The patient was then placed supine on the table and prepped and draped in the usual sterile fashion.  Ultrasound was used to evaluate the left antecubital fossa and vein was identified which was echolucent and compressible  indicating patency.  An ultrasound image was acquired for the permanent record.  A micropuncture needle was used to access the antecubital vein on the left. The microwire was then advanced under fluoroscopic guidance without difficulty followed by the micro-sheath.  A 0.035 J wire was advanced without resistance and a 6Fr sheath was placed.  Hand-injection contrast was then performed.  This demonstrated numerous collateral veins both brachial veins are occluded throughout their entire length.  Previously placed subclavian stent and anonymous stent is identified on fluoroscopy plate.  There appears to be reconstitution of the innominate portion with flow through the proximal aspect of the stented segment into the proximal and [place] and then superior vena cava.  Based on this if access could be obtained at the base of the neck hero graft might be feasible.  Patient was then reprepped and redraped so that her left neck was exposed.  Ultrasound showed a internal jugular vein which was echolucent and compressible but higher up in the neck just below the level of the angle of the jaw.  1% lidocaine was infiltrated in soft tissues image of the vein was recorded for the permanent record microneedle was inserted followed by microwire.  The wire would not advance very far under fluoroscopy and therefore I elected to perform hand-injection through the needle.  This demonstrated several large collaterals which crossed the midline of the neck and actually fill the right innominate's is quite tortuous and I do not think it would be acceptable as a pathway for the hero graft.  At the base of the neck the internal jugular is completely occluded and there is no pathway through to access the left and innominate vein.  The ultrasound was  returned to the field and in the supraclavicular area a large collateral was identified I also was able to identify the midportion of the stent with the ultrasound.  1% lidocaine was infiltrated  in soft tissues and access to this collateral was obtained.  I was then able to advance the microsheath.  I then swapped the microwire for a 0.035 advantage wire but I was not able to cross the stent and advance the wire into the innominate vein.  After multiple attempts I elected to terminate the case.  Summary: There is extensive occlusion of the majority of named veins in the left neck and arm.  Although her innominate vein is patent I do not see how I would be able to get access to this portion and therefore do not see a option for a left-sided hero graft.  She has a catheter on the right and therefore a hero graft is technically feasible but I do not see how to avoid recurrence of her steal syndrome.  At this point I would advocate for continued catheter-based access     Disposition: Patient was taken to the recovery room in stable condition having tolerated the procedure well.  [name] 11/05/2021,10:25 AM

## 2021-11-05 NOTE — Interval H&P Note (Signed)
History and Physical Interval Note:  11/05/2021 9:17 AM  [name]  has presented today for surgery, with the diagnosis of LT Arm Venogram   ESRD.  The various methods of treatment have been discussed with the patient and family. After consideration of risks, benefits and other options for treatment, the patient has consented to  Procedure(s): UPPER EXTREMITY VENOGRAPHY (Left) as a surgical intervention.  The patient's history has been reviewed, patient examined, no change in status, stable for surgery.  I have reviewed the patient's chart and labs.  Questions were answered to the patient's satisfaction.     Hortencia Pilar

## 2021-11-06 ENCOUNTER — Encounter: Payer: Self-pay | Admitting: Vascular Surgery

## 2021-11-15 ENCOUNTER — Inpatient Hospital Stay
Admission: EM | Admit: 2021-11-15 | Discharge: 2021-11-17 | DRG: 377 | Disposition: A | Discharge status: Home / Self Care | Payer: Medicare Other | Attending: Internal Medicine | Admitting: Internal Medicine

## 2021-11-15 ENCOUNTER — Emergency Department: Payer: Medicare Other

## 2021-11-15 DIAGNOSIS — R1013 Epigastric pain: Secondary | ICD-10-CM

## 2021-11-15 DIAGNOSIS — Z833 Family history of diabetes mellitus: Secondary | ICD-10-CM

## 2021-11-15 DIAGNOSIS — D631 Anemia in chronic kidney disease: Secondary | ICD-10-CM | POA: Diagnosis present

## 2021-11-15 DIAGNOSIS — K219 Gastro-esophageal reflux disease without esophagitis: Secondary | ICD-10-CM | POA: Diagnosis present

## 2021-11-15 DIAGNOSIS — K922 Gastrointestinal hemorrhage, unspecified: Secondary | ICD-10-CM | POA: Diagnosis not present

## 2021-11-15 DIAGNOSIS — E1042 Type 1 diabetes mellitus with diabetic polyneuropathy: Secondary | ICD-10-CM | POA: Diagnosis present

## 2021-11-15 DIAGNOSIS — I252 Old myocardial infarction: Secondary | ICD-10-CM | POA: Diagnosis not present

## 2021-11-15 DIAGNOSIS — Z85528 Personal history of other malignant neoplasm of kidney: Secondary | ICD-10-CM | POA: Diagnosis not present

## 2021-11-15 DIAGNOSIS — K3184 Gastroparesis: Secondary | ICD-10-CM | POA: Diagnosis present

## 2021-11-15 DIAGNOSIS — Z809 Family history of malignant neoplasm, unspecified: Secondary | ICD-10-CM

## 2021-11-15 DIAGNOSIS — Z888 Allergy status to other drugs, medicaments and biological substances status: Secondary | ICD-10-CM | POA: Diagnosis not present

## 2021-11-15 DIAGNOSIS — Z89412 Acquired absence of left great toe: Secondary | ICD-10-CM

## 2021-11-15 DIAGNOSIS — E871 Hypo-osmolality and hyponatremia: Secondary | ICD-10-CM | POA: Diagnosis present

## 2021-11-15 DIAGNOSIS — Z7982 Long term (current) use of aspirin: Secondary | ICD-10-CM

## 2021-11-15 DIAGNOSIS — N2581 Secondary hyperparathyroidism of renal origin: Secondary | ICD-10-CM | POA: Diagnosis present

## 2021-11-15 DIAGNOSIS — E669 Obesity, unspecified: Secondary | ICD-10-CM | POA: Diagnosis present

## 2021-11-15 DIAGNOSIS — E1043 Type 1 diabetes mellitus with diabetic autonomic (poly)neuropathy: Secondary | ICD-10-CM | POA: Diagnosis present

## 2021-11-15 DIAGNOSIS — E1022 Type 1 diabetes mellitus with diabetic chronic kidney disease: Secondary | ICD-10-CM | POA: Diagnosis present

## 2021-11-15 DIAGNOSIS — N186 End stage renal disease: Secondary | ICD-10-CM | POA: Diagnosis present

## 2021-11-15 DIAGNOSIS — K254 Chronic or unspecified gastric ulcer with hemorrhage: Principal | ICD-10-CM | POA: Diagnosis present

## 2021-11-15 DIAGNOSIS — J449 Chronic obstructive pulmonary disease, unspecified: Secondary | ICD-10-CM | POA: Diagnosis present

## 2021-11-15 DIAGNOSIS — E109 Type 1 diabetes mellitus without complications: Secondary | ICD-10-CM | POA: Diagnosis present

## 2021-11-15 DIAGNOSIS — Z992 Dependence on renal dialysis: Secondary | ICD-10-CM

## 2021-11-15 DIAGNOSIS — Z841 Family history of disorders of kidney and ureter: Secondary | ICD-10-CM

## 2021-11-15 DIAGNOSIS — Z20822 Contact with and (suspected) exposure to covid-19: Secondary | ICD-10-CM | POA: Diagnosis present

## 2021-11-15 DIAGNOSIS — F32A Depression, unspecified: Secondary | ICD-10-CM | POA: Diagnosis present

## 2021-11-15 DIAGNOSIS — I132 Hypertensive heart and chronic kidney disease with heart failure and with stage 5 chronic kidney disease, or end stage renal disease: Secondary | ICD-10-CM | POA: Diagnosis present

## 2021-11-15 DIAGNOSIS — Z794 Long term (current) use of insulin: Secondary | ICD-10-CM

## 2021-11-15 DIAGNOSIS — Z79899 Other long term (current) drug therapy: Secondary | ICD-10-CM

## 2021-11-15 DIAGNOSIS — Z91048 Other nonmedicinal substance allergy status: Secondary | ICD-10-CM

## 2021-11-15 DIAGNOSIS — I5032 Chronic diastolic (congestive) heart failure: Secondary | ICD-10-CM | POA: Diagnosis present

## 2021-11-15 DIAGNOSIS — E1051 Type 1 diabetes mellitus with diabetic peripheral angiopathy without gangrene: Secondary | ICD-10-CM | POA: Diagnosis present

## 2021-11-15 DIAGNOSIS — I2510 Atherosclerotic heart disease of native coronary artery without angina pectoris: Secondary | ICD-10-CM | POA: Diagnosis present

## 2021-11-15 LAB — RESP PANEL BY RT-PCR (FLU A&B, COVID) ARPGX2
Influenza A by PCR: NEGATIVE
Influenza B by PCR: NEGATIVE
SARS Coronavirus 2 by RT PCR: NEGATIVE

## 2021-11-15 LAB — TYPE AND SCREEN
ABO/RH(D): O POS
Antibody Screen: NEGATIVE

## 2021-11-15 LAB — CBC
HCT: 46.2 % — ABNORMAL HIGH (ref 36.0–46.0)
Hemoglobin: 14.3 g/dL (ref 12.0–15.0)
MCH: 28.5 pg (ref 26.0–34.0)
MCHC: 31 g/dL (ref 30.0–36.0)
MCV: 92.2 fL (ref 80.0–100.0)
Platelets: 194 10*3/uL (ref 150–400)
RBC: 5.01 MIL/uL (ref 3.87–5.11)
RDW: 16.7 % — ABNORMAL HIGH (ref 11.5–15.5)
WBC: 7.3 10*3/uL (ref 4.0–10.5)
nRBC: 0 % (ref 0.0–0.2)

## 2021-11-15 LAB — BASIC METABOLIC PANEL
Anion gap: 12 (ref 5–15)
BUN: 35 mg/dL — ABNORMAL HIGH (ref 8–23)
CO2: 26 mmol/L (ref 22–32)
Calcium: 8.4 mg/dL — ABNORMAL LOW (ref 8.9–10.3)
Chloride: 92 mmol/L — ABNORMAL LOW (ref 98–111)
Creatinine, Ser: 5.61 mg/dL — ABNORMAL HIGH (ref 0.44–1.00)
GFR, Estimated: 8 mL/min — ABNORMAL LOW (ref >60)
Glucose, Bld: 149 mg/dL — ABNORMAL HIGH (ref 70–99)
Potassium: 4.5 mmol/L (ref 3.5–5.1)
Sodium: 130 mmol/L — ABNORMAL LOW (ref 135–145)

## 2021-11-15 LAB — HEPATIC FUNCTION PANEL
ALT: 15 U/L (ref 0–44)
AST: 28 U/L (ref 15–41)
Albumin: 4 g/dL (ref 3.5–5.0)
Alkaline Phosphatase: 87 U/L (ref 38–126)
Bilirubin, Direct: <0.1 mg/dL (ref 0.0–0.2)
Total Bilirubin: 0.5 mg/dL (ref 0.3–1.2)
Total Protein: 8.3 g/dL — ABNORMAL HIGH (ref 6.5–8.1)

## 2021-11-15 LAB — TROPONIN I (HIGH SENSITIVITY)
Troponin I (High Sensitivity): 10 ng/L (ref <18)
Troponin I (High Sensitivity): 8 ng/L (ref <18)

## 2021-11-15 LAB — HEMOGLOBIN AND HEMATOCRIT, BLOOD
HCT: 46.1 % — ABNORMAL HIGH (ref 36.0–46.0)
Hemoglobin: 14.3 g/dL (ref 12.0–15.0)

## 2021-11-15 LAB — LIPASE, BLOOD: Lipase: 43 U/L (ref 11–51)

## 2021-11-15 LAB — HEMOGLOBIN A1C
Hgb A1c MFr Bld: 6.2 % — ABNORMAL HIGH (ref 4.8–5.6)
Mean Plasma Glucose: 131.24 mg/dL

## 2021-11-15 LAB — CBG MONITORING, ED: Glucose-Capillary: 130 mg/dL — ABNORMAL HIGH (ref 70–99)

## 2021-11-15 MED ORDER — ALBUTEROL SULFATE HFA 108 (90 BASE) MCG/ACT IN AERS: 2.0000 | INHALATION_SPRAY | RESPIRATORY_TRACT | Status: DC | PRN

## 2021-11-15 MED ORDER — PANTOPRAZOLE 80MG IVPB - SIMPLE MED
80.0000 mg | Freq: Once | INTRAVENOUS | Status: AC
  Administered 2021-11-15: 80 mg via INTRAVENOUS
  Filled 2021-11-15: qty 100

## 2021-11-15 MED ORDER — ATORVASTATIN CALCIUM 20 MG PO TABS: 20.0000 mg | ORAL_TABLET | Freq: Every evening | ORAL | Status: DC

## 2021-11-15 MED ORDER — NEPRO/CARBSTEADY PO LIQD: 237.0000 mL | Freq: Two times a day (BID) | ORAL | Status: DC

## 2021-11-15 MED ORDER — PANTOPRAZOLE INFUSION (NEW) - SIMPLE MED
8.0000 mg/h | INTRAVENOUS | Status: DC
  Administered 2021-11-15 – 2021-11-16 (×3): 8 mg/h via INTRAVENOUS
  Filled 2021-11-15 (×4): qty 100

## 2021-11-15 MED ORDER — IOHEXOL 300 MG/ML  SOLN
75.0000 mL | Freq: Once | INTRAMUSCULAR | Status: AC | PRN
  Administered 2021-11-15: 75 mL via INTRAVENOUS

## 2021-11-15 MED ORDER — CALCIUM CARBONATE ANTACID 500 MG PO CHEW: 500.0000 mg | CHEWABLE_TABLET | Freq: Four times a day (QID) | ORAL | Status: DC | PRN

## 2021-11-15 MED ORDER — INSULIN DETEMIR 100 UNIT/ML ~~LOC~~ SOLN
2.0000 [IU] | Freq: Every day | SUBCUTANEOUS | Status: DC
  Administered 2021-11-15: 2 [IU] via SUBCUTANEOUS
  Filled 2021-11-15 (×3): qty 0.02

## 2021-11-15 MED ORDER — MECLIZINE HCL 25 MG PO TABS
25.0000 mg | ORAL_TABLET | Freq: Four times a day (QID) | ORAL | Status: DC | PRN
  Filled 2021-11-15: qty 1

## 2021-11-15 MED ORDER — PANTOPRAZOLE SODIUM 40 MG IV SOLR: 40.0000 mg | Freq: Two times a day (BID) | INTRAVENOUS | Status: DC

## 2021-11-15 MED ORDER — INSULIN ASPART 100 UNIT/ML IJ SOLN: 0.0000 [IU] | Freq: Three times a day (TID) | INTRAMUSCULAR | Status: DC

## 2021-11-15 MED ORDER — ALUM & MAG HYDROXIDE-SIMETH 200-200-20 MG/5ML PO SUSP: 15.0000 mL | Freq: Four times a day (QID) | ORAL | Status: DC | PRN

## 2021-11-15 MED ORDER — FAMOTIDINE 20 MG PO TABS: 40.0000 mg | ORAL_TABLET | Freq: Once | ORAL | Status: DC

## 2021-11-15 MED ORDER — LOPERAMIDE HCL 2 MG PO CAPS
2.0000 mg | ORAL_CAPSULE | Freq: Once | ORAL | Status: DC
Start: 2021-11-15 — End: 2021-11-15

## 2021-11-15 MED ORDER — GABAPENTIN 100 MG PO CAPS
100.0000 mg | ORAL_CAPSULE | Freq: Every day | ORAL | Status: DC
  Administered 2021-11-16: 100 mg via ORAL
  Filled 2021-11-15: qty 1

## 2021-11-15 MED ORDER — SUCRALFATE 1 G PO TABS
1.0000 g | ORAL_TABLET | Freq: Once | ORAL | Status: AC
  Administered 2021-11-15: 1 g via ORAL
  Filled 2021-11-15: qty 1

## 2021-11-15 MED ORDER — SEVELAMER CARBONATE 800 MG PO TABS
800.0000 mg | ORAL_TABLET | Freq: Three times a day (TID) | ORAL | Status: DC
  Administered 2021-11-16 – 2021-11-17 (×2): 800 mg via ORAL
  Filled 2021-11-15 (×8): qty 1

## 2021-11-15 MED ORDER — DIPHENHYDRAMINE HCL 50 MG/ML IJ SOLN
25.0000 mg | Freq: Once | INTRAMUSCULAR | Status: AC
  Administered 2021-11-15: 25 mg via INTRAVENOUS
  Filled 2021-11-15: qty 1

## 2021-11-15 MED ORDER — NITROGLYCERIN 0.4 MG SL SUBL: 0.4000 mg | SUBLINGUAL_TABLET | SUBLINGUAL | Status: DC | PRN

## 2021-11-15 MED ORDER — HYDROCODONE-ACETAMINOPHEN 5-325 MG PO TABS
1.0000 | ORAL_TABLET | Freq: Three times a day (TID) | ORAL | Status: DC | PRN
  Administered 2021-11-15 – 2021-11-16 (×2): 1 via ORAL
  Filled 2021-11-15 (×2): qty 1

## 2021-11-15 MED ORDER — AMLODIPINE BESYLATE 10 MG PO TABS
10.0000 mg | ORAL_TABLET | Freq: Every day | ORAL | Status: DC
  Administered 2021-11-17: 10 mg via ORAL
  Filled 2021-11-15: qty 1

## 2021-11-15 MED ORDER — VITAMIN B-12 1000 MCG PO TABS
1000.0000 ug | ORAL_TABLET | Freq: Every day | ORAL | Status: DC
  Administered 2021-11-16 – 2021-11-17 (×2): 1000 ug via ORAL
  Filled 2021-11-15 (×3): qty 1

## 2021-11-15 NOTE — ED Provider Notes (Signed)
[facility] Provider Note    Event Date/Time   First MD Initiated Contact with Patient 11/15/21 1302     (approximate)   History   Chest Pain   HPI  [name] is a 61 y.o. female with a history of diabetes, hypertension, ESRD on hemodialysis, gastroparesis who comes ED complaining of epigastric pain radiating up into the chest, started this morning when she was waiting to start her routine dialysis session.  No back pain or diarrhea.  No constipation.  No fever or chills.  She was given Tylenol without relief.  No relief with aspirin and nitroglycerin given by EMS as well.  Feels like indigestion that she has had in the past.  No exertional symptoms.  Not pleuritic, no shortness of breath.  No diaphoresis or vomiting.     Physical Exam   Triage Vital Signs: ED Triage Vitals  Enc Vitals Group     BP 11/15/21 1221 (!) 145/80     Pulse Rate 11/15/21 1221 61     Resp 11/15/21 1221 18     Temp 11/15/21 1221 98 F (36.7 C)     Temp Source 11/15/21 1221 Oral     SpO2 11/15/21 1221 100 %     Weight 11/15/21 1213 161 lb (73 kg)     Height 11/15/21 1213 5\' 2"  (1.575 m)     Head Circumference --      Peak Flow --      Pain Score 11/15/21 1212 9     Pain Loc --      Pain Edu? --      Excl. in [place]? --     Most recent vital signs: Vitals:   11/15/21 1330 11/15/21 1430  BP: 134/61 130/66  Pulse: 61 63  Resp: 16 11  Temp:    SpO2: 100% 100%     General: Awake, no distress.  CV:  Good peripheral perfusion.  Regular rate and rhythm Resp:  Normal effort.  Clear to auscultation bilaterally Abd:  No distention.  Pronounced epigastric and left upper quadrant tenderness Other:  Chronic lower extremity lymphedema.  No wounds or inflammatory changes.   ED Results / Procedures / Treatments   Labs (all labs ordered are listed, but only abnormal results are displayed) Labs Reviewed  BASIC METABOLIC PANEL - Abnormal; Notable for the following components:       Result Value   Sodium 130 (*)    Chloride 92 (*)    Glucose, Bld 149 (*)    BUN 35 (*)    Creatinine, Ser 5.61 (*)    Calcium 8.4 (*)    GFR, Estimated 8 (*)    All other components within normal limits  CBC - Abnormal; Notable for the following components:   HCT 46.2 (*)    RDW 16.7 (*)    All other components within normal limits  HEPATIC FUNCTION PANEL - Abnormal; Notable for the following components:   Total Protein 8.3 (*)    All other components within normal limits  RESP PANEL BY RT-PCR (FLU A&B, COVID) ARPGX2  LIPASE, BLOOD  TROPONIN I (HIGH SENSITIVITY)  TROPONIN I (HIGH SENSITIVITY)     EKG  Interpreted by me Normal sinus rhythm rate of 62.  Normal axis.  Poor R wave progression.  Normal ST segments and T waves, no acute ischemic changes.   RADIOLOGY Chest x-ray viewed and interpreted by me, unremarkable.  No pneumonia or pneumothorax.  Radiology report reviewed.  CT abdomen pelvis pending.    PROCEDURES:  Critical Care performed: No  Procedures   MEDICATIONS ORDERED IN ED: Medications  sucralfate (CARAFATE) tablet 1 g (has no administration in time range)  famotidine (PEPCID) tablet 40 mg (has no administration in time range)  diphenhydrAMINE (BENADRYL) injection 25 mg (25 mg Intravenous Given 11/15/21 1339)  iohexol (OMNIPAQUE) 300 MG/ML solution 75 mL (75 mLs Intravenous Contrast Given 11/15/21 1521)     IMPRESSION / MDM / ASSESSMENT AND PLAN / ED COURSE  I reviewed the triage vital signs and the nursing notes.                              Differential diagnosis includes, but is not limited to, gastritis, anxiety, pancreatitis, biliary disease, gastroparesis, viral illness  Patient presents with upper abdominal pain which prohibited her from receiving her routine dialysis session today.  She last had dialysis 2 days ago.  Vital signs are unremarkable, she is not in distress, lab work-up is all reassuring.  COVID and flu negative.  Awaiting  CT abdomen pelvis which is necessary due to her severe chronic health conditions and risk of decompensation of those conditions or related complications.  If the CT scan is unremarkable, I think the patient would not require admission due to the reassuring work-up, and can be treated with Carafate and Pepcid and discharged to schedule a make-up session with her dialysis clinic tomorrow.  Care signed out to Dr. Rip Harbour at 3:00.       FINAL CLINICAL IMPRESSION(S) / ED DIAGNOSES   Final diagnoses:  Epigastric pain  ESRD on hemodialysis ([place])     Rx / DC Orders   ED Discharge Orders     None        Note:  This document was prepared using Dragon voice recognition software and may include unintentional dictation errors.   Carrie Mew, MD 11/15/21 [phone]

## 2021-11-15 NOTE — ED Notes (Signed)
RN and EDT unable to obtain blood work

## 2021-11-15 NOTE — ED Notes (Addendum)
Patient's sisterRaniyah Curenton [phone] for updates.

## 2021-11-15 NOTE — H&P (Signed)
History and Physical    Patient: [name]:[id number] DOB: [birth year] DOA: 11/15/2021 DOS: the patient was seen and examined on 11/15/2021 PCP: Denton Lank, MD  Patient coming from: Home  Chief Complaint:  Chief Complaint  Patient presents with   Chest Pain    HPI: [name] is a 61 y.o. female with medical history significant of multiple medical problems including end-stage renal disease, COPD, coronary disease with chronic diastolic congestive heart failure, peripheral arterial disease, type 2 diabetes with diabetic peripheral neuropathy and gastroparesis who presents to the hospital complaining of upper abdominal pain. Patient has had some indigestion for the last 2 days, she had a poor appetite, she had increased acid reflux.  She went to hemodialysis today, but was complaining significant abdominal pain.  She was sent to the hospital from dialysis unit without completing the procedure.  The pain localized upper stomach, dull and cramping-like, 10/10 in severity.  No radiation.  Patient has some nausea but no vomiting.  She has some black stool which is normal for her when she was taking iron pills. After nausea episode, she felt food in her throat, she also had some chest pain.  Upon arriving the hospital, her temperature was 98, heart rate 65, blood pressure 137/61, oxygen saturation 96% on room air. CT abdomen/pelvis with contrast showed a mass structure in the stomach.  NG tube was placed, suction out pink-colored blood.  NG tube was subsequently removed afterwards.  Review of Systems: As mentioned in the history of present illness. All other systems reviewed and are negative. Past Medical History:  Diagnosis Date   Anemia    Anginal pain (HCC)    Anxiety    Arthritis    Asthma    Broken wrist    Bronchitis    chronic diastolic CHF 0/25/4270   Chronic kidney disease    COPD (chronic obstructive pulmonary disease) ([place])    Coronary artery disease    a. cath 2013:  stenting to RCA (report not available); b. cath 2014: LM nl, pLAD 40%, mLAD nl, ost LCx 40%, mid LCx nl, pRCA 30% @ site of prior stent, mRCA 50%   Depression    Diabetes mellitus ([employer])    Diabetes mellitus without complication ([place])    Diabetic neuropathy ([place])    dialysis 2006   Diverticulosis    Dizziness    Dyspnea    Elevated lipids    Environmental and seasonal allergies    ESRD (end stage renal disease) on dialysis ([place])    M-W-F   Gastroparesis    GERD (gastroesophageal reflux disease)    Headache    History of anemia due to chronic kidney disease    History of hiatal hernia    HOH (hard of hearing)    Hx of pancreatitis 2015   Hypertension    Lower extremity edema    Mitral regurgitation    a. echo 10/2013: EF 62%, noWMA, mildly dilated LA, mild to mod MR/TR, GR1DD   Myocardial infarction ([place])    Orthopnea    Parathyroid abnormality ([place])    Peripheral arterial disease ([place])    Pneumonia    Renal cancer ([place])    Renal insufficiency    Pt is on dialysis on M,W + F.   Wheezing    Past Surgical History:  Procedure Laterality Date   A/V SHUNTOGRAM Left 01/20/2018   Procedure: A/V SHUNTOGRAM;  Surgeon: Algernon Huxley, MD;  Location: [place] [facility];  Service: Cardiovascular;  Laterality: Left;   ABDOMINAL HYSTERECTOMY  1992   AMPUTATION TOE Left 10/02/2017   Procedure: AMPUTATION TOE-LEFT GREAT TOE;  Surgeon: Albertine Patricia, DPM;  Location: [facility];  Service: Podiatry;  Laterality: Left;   APPENDECTOMY     APPLICATION OF WOUND VAC N/A 11/25/2019   Procedure: APPLICATION OF WOUND VAC;  Surgeon: Katha Cabal, MD;  Location: [facility];  Service: Vascular;  Laterality: N/A;   ARTERY BIOPSY Right 10/11/2018   Procedure: BIOPSY TEMPORAL ARTERY;  Surgeon: Vickie Epley, MD;  Location: [facility];  Service: General;  Laterality: Right;   CARDIAC CATHETERIZATION Left 07/26/2015   Procedure: Left Heart Cath and Coronary Angiography;  Surgeon: Dionisio David, MD;   Location: [place] [facility];  Service: Cardiovascular;  Laterality: Left;   CATARACT EXTRACTION W/ INTRAOCULAR LENS IMPLANT Right    CATARACT EXTRACTION W/PHACO Left 03/10/2017   Procedure: CATARACT EXTRACTION PHACO AND INTRAOCULAR LENS PLACEMENT ([place]);  Surgeon: Birder Robson, MD;  Location: [facility];  Service: Ophthalmology;  Laterality: Left;  [place] 00:51.9 AP% 14.2 CDE 7.39 fluid pack lot # [id number]   CENTRAL LINE INSERTION Right 11/11/2019   Procedure: CENTRAL LINE INSERTION;  Surgeon: Katha Cabal, MD;  Location: [facility];  Service: Vascular;  Laterality: Right;   CENTRAL LINE INSERTION  11/25/2019   Procedure: CENTRAL LINE INSERTION;  Surgeon: Katha Cabal, MD;  Location: [facility];  Service: Vascular;;   CHOLECYSTECTOMY     COLONOSCOPY WITH PROPOFOL N/A 08/12/2016   Procedure: COLONOSCOPY WITH PROPOFOL;  Surgeon: Lollie Sails, MD;  Location: [facility];  Service: Endoscopy;  Laterality: N/A;   DIALYSIS FISTULA CREATION Left    upper arm   dialysis grafts     DIALYSIS/PERMA CATHETER INSERTION N/A 11/14/2019   Procedure: DIALYSIS/PERMA CATHETER INSERTION;  Surgeon: Algernon Huxley, MD;  Location: [place] [facility];  Service: Cardiovascular;  Laterality: N/A;   DIALYSIS/PERMA CATHETER INSERTION N/A 02/03/2020   Procedure: DIALYSIS/PERMA CATHETER INSERTION;  Surgeon: Katha Cabal, MD;  Location: [place] [facility];  Service: Cardiovascular;  Laterality: N/A;   DIALYSIS/PERMA CATHETER INSERTION N/A 06/19/2020   Procedure: DIALYSIS/PERMA CATHETER INSERTION;  Surgeon: Katha Cabal, MD;  Location: [place] [facility];  Service: Cardiovascular;  Laterality: N/A;   DIALYSIS/PERMA CATHETER INSERTION N/A 05/07/2021   Procedure: DIALYSIS/PERMA CATHETER INSERTION;  Surgeon: Katha Cabal, MD;  Location: [place] [facility];  Service: Cardiovascular;  Laterality: N/A;  Patient needs general anethesia   DIALYSIS/PERMA CATHETER REMOVAL N/A 05/25/2020   Procedure:  DIALYSIS/PERMA CATHETER REMOVAL;  Surgeon: Katha Cabal, MD;  Location: [place] [facility];  Service: Cardiovascular;  Laterality: N/A;   ESOPHAGOGASTRODUODENOSCOPY N/A 03/08/2015   Procedure: ESOPHAGOGASTRODUODENOSCOPY (EGD);  Surgeon: Manya Silvas, MD;  Location: [facility];  Service: Endoscopy;  Laterality: N/A;   ESOPHAGOGASTRODUODENOSCOPY (EGD) WITH PROPOFOL N/A 03/18/2016   Procedure: ESOPHAGOGASTRODUODENOSCOPY (EGD) WITH PROPOFOL;  Surgeon: [name], MD;  Location: [facility];  Service: Endoscopy;  Laterality: N/A;   EYE SURGERY Right 2018   FECAL TRANSPLANT N/A 08/23/2015   Procedure: FECAL TRANSPLANT;  Surgeon: Manya Silvas, MD;  Location: [facility] ENDOSCOPY;  Service: Endoscopy;  Laterality: N/A;   HAND SURGERY Bilateral    HEMATOMA EVACUATION Left 11/25/2019   Procedure: EVACUATION HEMATOMA;  Surgeon: Katha Cabal, MD;  Location: [facility];  Service: Vascular;  Laterality: Left;   I & D EXTREMITY Left 11/25/2019   Procedure: IRRIGATION AND DEBRIDEMENT EXTREMITY;  Surgeon: Katha Cabal, MD;  Location: [facility];  Service: Vascular;  Laterality: Left;   IR FLUORO GUIDE CV LINE RIGHT  04/06/2020   IR INJECT/THERA/INC NEEDLE/CATH/PLC EPI/CERV/THOR W/IMG  08/13/2020   IR RADIOLOGIST EVAL & MGMT  07/28/2019   IR RADIOLOGIST EVAL & MGMT  08/11/2019   LIGATION OF ARTERIOVENOUS  FISTULA Left 11/11/2019   LIGATION OF ARTERIOVENOUS  FISTULA Left 11/11/2019   Procedure: LIGATION OF ARTERIOVENOUS  FISTULA;  Surgeon: Katha Cabal, MD;  Location: [facility];  Service: Vascular;  Laterality: Left;   LIGATIONS OF HERO GRAFT Right 06/13/2020   Procedure: LIGATION / REMOVAL OF RIGHT HERO GRAFT;  Surgeon: Katha Cabal, MD;  Location: [facility];  Service: Vascular;  Laterality: Right;   PERIPHERAL VASCULAR CATHETERIZATION N/A 12/20/2015   Procedure: Thrombectomy of dialysis access versus permcath placement;  Surgeon: Algernon Huxley, MD;  Location: [place] [facility];   Service: Cardiovascular;  Laterality: N/A;   PERIPHERAL VASCULAR CATHETERIZATION N/A 12/20/2015   Procedure: A/V Shunt Intervention;  Surgeon: Algernon Huxley, MD;  Location: [place] [facility];  Service: Cardiovascular;  Laterality: N/A;   PERIPHERAL VASCULAR CATHETERIZATION N/A 12/20/2015   Procedure: A/V Shuntogram/Fistulagram;  Surgeon: Algernon Huxley, MD;  Location: [place] [facility];  Service: Cardiovascular;  Laterality: N/A;   PERIPHERAL VASCULAR CATHETERIZATION N/A 01/02/2016   Procedure: A/V Shuntogram/Fistulagram;  Surgeon: Algernon Huxley, MD;  Location: [place] [facility];  Service: Cardiovascular;  Laterality: N/A;   PERIPHERAL VASCULAR CATHETERIZATION N/A 01/02/2016   Procedure: A/V Shunt Intervention;  Surgeon: Algernon Huxley, MD;  Location: [place] [facility];  Service: Cardiovascular;  Laterality: N/A;   TEE WITHOUT CARDIOVERSION N/A 06/11/2020   Procedure: TRANSESOPHAGEAL ECHOCARDIOGRAM (TEE);  Surgeon: Teodoro Spray, MD;  Location: [facility];  Service: Cardiovascular;  Laterality: N/A;   UPPER EXTREMITY ANGIOGRAPHY Bilateral 11/27/2020   Procedure: UPPER EXTREMITY ANGIOGRAPHY;  Surgeon: Katha Cabal, MD;  Location: [place] [facility];  Service: Cardiovascular;  Laterality: Bilateral;   UPPER EXTREMITY VENOGRAPHY Right 01/18/2020   Procedure: UPPER EXTREMITY VENOGRAPHY;  Surgeon: Katha Cabal, MD;  Location: [place] [facility];  Service: Cardiovascular;  Laterality: Right;   UPPER EXTREMITY VENOGRAPHY Bilateral 11/27/2020   Procedure: UPPER EXTREMITY VENOGRAPHY;  Surgeon: Katha Cabal, MD;  Location: [place] [facility];  Service: Cardiovascular;  Laterality: Bilateral;   UPPER EXTREMITY VENOGRAPHY Left 11/05/2021   Procedure: UPPER EXTREMITY VENOGRAPHY;  Surgeon: Katha Cabal, MD;  Location: [place] [facility];  Service: Cardiovascular;  Laterality: Left;   VASCULAR ACCESS DEVICE INSERTION Right 04/13/2020   Procedure: INSERTION OF HERO VASCULAR ACCESS DEVICE  (GRAFT);  Surgeon: Katha Cabal, MD;  Location: [facility];  Service: Vascular;  Laterality: Right;   Social History:  reports that she has never smoked. She has never used smokeless tobacco. She reports that she does not currently use alcohol. She reports current drug use. Drug: Marijuana.  Allergies  Allergen Reactions   Ace Inhibitors Swelling and Anaphylaxis   Ativan [Lorazepam] Other (See Comments)    Reaction:  Hallucinations and headaches   Compazine [Prochlorperazine Edisylate] Anaphylaxis, Nausea And Vomiting and Other (See Comments)    Other reaction(s): dystonia from this vs. Reglan, 23 Jul - patient relates that she takes promethazine frequently with no problems   Sumatriptan Succinate Other (See Comments)    Other reaction(s): delirium and hallucinations per [facility] records   Zofran [Ondansetron] Nausea And Vomiting    Per pt. she is allergic to zofran or  will experience adverse reaction like hallucination    Losartan Nausea Only   Prochlorperazine Other (See Comments)    Reaction:  Unknown . Patient does not remember reaction but she does have vertigo and anxiety along with n and v at times. Could be used to treat any of these    Reglan [Metoclopramide] Other (See Comments)    Per patient her Dr. Evelina Bucy her off it    Scopolamine Other (See Comments)    Dizziness, also has vertigo already   Tape Rash    Plastic tape causes rash   Tapentadol Rash   Ultrasound Gel Itching    Patient states the Ultrasound gel caused itching while in skin contact and resolved when wiped away.    Family History  Problem Relation Age of Onset   Kidney disease Mother    Diabetes Mother    Cancer Father    Kidney disease Sister     Prior to Admission medications   Medication Sig Start Date End Date Taking? Authorizing Provider  albuterol (PROVENTIL) (2.5 MG/3ML) 0.083% nebulizer solution Take 2.5 mg by nebulization every 4 (four) hours as needed for wheezing or shortness of breath.     [provider]  albuterol (VENTOLIN HFA) 108 (90 Base) MCG/ACT inhaler Inhale 2 puffs into the lungs every 4 (four) hours as needed for wheezing or shortness of breath. 11/09/19   [provider]  alum & mag hydroxide-simeth (MAALOX/MYLANTA) 200-200-20 MG/5ML suspension Take 15 mLs by mouth every 6 (six) hours as needed for indigestion or heartburn. 06/25/20   Enzo Bi, MD  amLODipine (NORVASC) 10 MG tablet Take 1 tablet (10 mg total) by mouth daily. 06/25/20   Enzo Bi, MD  ammonium lactate (LAC-HYDRIN) 12 % lotion Apply 1 application topically 2 (two) times daily as needed for dry skin.     [provider]  aspirin EC 81 MG tablet Take 81 mg by mouth daily.    [provider]  atorvastatin (LIPITOR) 20 MG tablet Take 20 mg by mouth every evening.  10/24/19   [provider]  Biotin 1 MG CAPS Take 1 mg by mouth daily.    [provider]  calcium carbonate (TUMS - DOSED IN MG ELEMENTAL CALCIUM) 500 MG chewable tablet Chew 2.5 tablets (500 mg of elemental calcium total) by mouth every 6 (six) hours as needed for indigestion. 06/25/20   Enzo Bi, MD  Cholecalciferol (VITAMIN D3) 25 MCG (1000 UT) CAPS Take 1,000 Units by mouth daily.     [provider]  cyanocobalamin 1000 MCG tablet Take 1,000 mcg by mouth daily.     [provider]  diphenhydrAMINE (BENADRYL) 25 MG tablet Take 25 mg by mouth every 6 (six) hours as needed for itching.    [provider]  epoetin alfa (EPOGEN) 10000 UNIT/ML injection Inject 1 mL (10,000 Units total) into the vein every Monday, Wednesday, and Friday with hemodialysis. 06/27/20   Enzo Bi, MD  Ferrous Sulfate (IRON) 325 (65 Fe) MG TABS Take 325 mg by mouth daily.     [provider]  gabapentin (NEURONTIN) 100 MG capsule Take 100 mg by mouth at bedtime.    [provider]  HYDROcodone-acetaminophen (NORCO/VICODIN) 5-325 MG tablet Take 1 tablet by mouth 3 (three) times daily as  needed for moderate pain or severe pain. 05/10/21   Wouk, Ailene Rud, MD  hydrOXYzine (ATARAX/VISTARIL) 25 MG tablet Take 25 mg by mouth daily as needed for itching.    [provider]  lidocaine (LIDODERM) 5 % Place 1 patch onto the skin daily. (Remove after 12 hours -- leave off for 12 hours before applying new patch)    [provider]  meclizine (ANTIVERT) 25 MG tablet Take 25 mg by mouth every 6 (six) hours as needed for dizziness.    [provider]  mirtazapine (REMERON) 7.5 MG tablet Take 7.5 mg by mouth daily. 04/15/21   [provider]  multivitamin (RENA-VIT) TABS tablet Take 1 tablet by mouth daily.     [provider]  mupirocin ointment (BACTROBAN) 2 % Apply 1 application topically daily as needed (leg rash).  11/09/19   [provider]  nitroGLYCERIN (NITROSTAT) 0.4 MG SL tablet Place 0.4 mg under the tongue every 5 (five) minutes as needed for chest pain.     [provider]  Nutritional Supplements (FEEDING SUPPLEMENT, NEPRO CARB STEADY,) LIQD Take 237 mLs by mouth 2 (two) times daily between meals. 12/17/20   Lorella Nimrod, MD  pantoprazole (PROTONIX) 40 MG tablet Take 40 mg by mouth daily. 08/28/19   [provider]  polyethylene glycol (MIRALAX / GLYCOLAX) 17 g packet Take 17 g by mouth daily as needed. Patient taking differently: Take 17 g by mouth daily as needed for mild constipation or moderate constipation. 04/02/21   Nolberto Hanlon, MD  Probiotic Product (PROBIOTIC PO) Take 1 capsule by mouth daily.    [provider]  promethazine (PHENERGAN) 12.5 MG tablet Take 1 tablet (12.5 mg total) by mouth every 6 (six) hours as needed for nausea or vomiting. 05/10/21   Wouk, Ailene Rud, MD  sevelamer carbonate (RENVELA) 800 MG tablet Take 800 mg by mouth 4 (four) times daily. (Take with meals and snack)    [provider]  vitamin E 180 MG (400 UNITS) capsule Take 400 Units by mouth daily.     [provider]    Physical Exam: Vitals:   11/15/21 1221 11/15/21 1330 11/15/21 1430 11/15/21 1620  BP: (!) 145/80 134/61 130/66 137/61  Pulse: 61 61 63 64  Resp: 18 16 11 13   Temp: 98 F (36.7 C)     TempSrc: Oral     SpO2: 100% 100% 100% 100%  Weight:      Height:       Physical Exam Constitutional:      General: She is not in acute distress.    Appearance: She is not ill-appearing or diaphoretic.  HENT:     Head: Normocephalic and atraumatic.  Eyes:     Pupils: Pupils are equal, round, and reactive to light.  Neck:     Thyroid: No thyromegaly.  Cardiovascular:     Rate and Rhythm: Normal rate and regular rhythm.     Heart sounds: Normal heart sounds. No murmur heard. Pulmonary:     Effort: Pulmonary effort is normal.     Breath sounds: Normal breath sounds.  Chest:     Chest wall: No deformity or tenderness.  Abdominal:     General: Bowel sounds are normal.     Palpations: Abdomen is soft.     Tenderness: There is abdominal tenderness. There is guarding. There is no rebound.  Musculoskeletal:        General: Normal range of motion.     Cervical back: Normal range of motion and neck supple.     Right lower leg: Edema present.     Left lower leg: Edema present.  Skin:    General: Skin is warm  and dry.  Neurological:     Mental Status: She is alert.  Psychiatric:        Mood and Affect: Mood normal.        Behavior: Behavior normal.     Data Reviewed: CT scan reviewed, results as above. Lab reviewed.  Assessment and Plan: Abdominal pain. Upper GI bleed. Most likely patient has GI bleed secondary to gastric ulceration.  Malignancy is possible. Dr. Allen Norris has been contacted by emergency room, will place formal consult. IV Protonix drip is started. Continue monitor hemoglobin for anemia.  Currently hemoglobin 14.3.  Type 1 diabetes. Glucose seem to be controlled.  I will hold off long-acting insulin, start sensitive sliding scale insulin.  Patient  states that that she was taking insulin at home, her blood glucose is very labile, I will also placed on 2 units of Levemir daily.  End-stage renal disease. Hyponatremia. Patient missed dialysis today, currently she does not have indication for emergent dialysis.  Dr. Holley Raring has been contacted by emergency room.  Formal consult is obtained.  Coronary artery disease. Chronic diastolic congestive heart failure. Conditions are stable.  COPD  No bronchospasm.   Advance Care Planning:   Code Status: Full Code Full  Consults: GI, Nephrology  Family Communication: Son updated at the bedside.  Severity of Illness: The appropriate patient status for this patient is INPATIENT. Inpatient status is judged to be reasonable and necessary in order to provide the required intensity of service to ensure the patient's safety. The patient's presenting symptoms, physical exam findings, and initial radiographic and laboratory data in the context of their chronic comorbidities is felt to place them at high risk for further clinical deterioration. Furthermore, it is not anticipated that the patient will be medically stable for discharge from the hospital within 2 midnights of admission.   * I certify that at the point of admission it is my clinical judgment that the patient will require inpatient hospital care spanning beyond 2 midnights from the point of admission due to high intensity of service, high risk for further deterioration and high frequency of surveillance required.*  Author: Sharen Hones, MD 11/15/2021 5:41 PM  For on call review [email or web address].

## 2021-11-15 NOTE — ED Notes (Signed)
Pt had a BM. RN did pericare and put a new brief on pt.

## 2021-11-15 NOTE — ED Triage Notes (Signed)
Pt comes into the ED via [facility] from [facility] where the patient c/o central chest pain with no radiation.  Pt states it is worsened with inhalation.  Pt did not start her dialysis today.  324 ASA given and 1 nitro spray with no relief per patient.  Pt in NAD at this time with even and unlabored respirations.    131/59 63 HR 100% @ 2 L (baseline) 210 CBG 97.7 oral

## 2021-11-15 NOTE — ED Triage Notes (Signed)
Pt to ED via [facility] from dialysis. Pt reports sudden onset centralized CP. Pt given 324 ASA and nitro spray in route.  Pt reports HA after nitro spray. Pt wears 2L [place] chronically. Pt states she is on blood thinner. Pt with hx MI, DM, COPD and HTN.

## 2021-11-15 NOTE — ED Notes (Signed)
Pt with BM, soft, non-formed stool. Brief changed, cleaned, peri care performed, barrier cream applied, new brief applied, pt repositioned onto left side.

## 2021-11-15 NOTE — ED Notes (Signed)
Pt had a BM pt brief was changed and pericare was done.

## 2021-11-15 NOTE — ED Notes (Signed)
Pt had a BM. RN did pericare and placed new bed pads on bed and put pt in new brief.

## 2021-11-15 NOTE — Discharge Instructions (Signed)
Call your dialysis clinic for make-up session tomorrow.

## 2021-11-15 NOTE — ED Notes (Signed)
Pt had a BM. RN changed pt into one of pt's new brief. Pericare was done and RN did a full bed linen change. Pt was given 2 warm blankets.

## 2021-11-16 ENCOUNTER — Other Ambulatory Visit: Payer: Self-pay

## 2021-11-16 DIAGNOSIS — R1013 Epigastric pain: Secondary | ICD-10-CM

## 2021-11-16 LAB — BASIC METABOLIC PANEL
Anion gap: 14 (ref 5–15)
BUN: 40 mg/dL — ABNORMAL HIGH (ref 8–23)
CO2: 22 mmol/L (ref 22–32)
Calcium: 7.8 mg/dL — ABNORMAL LOW (ref 8.9–10.3)
Chloride: 93 mmol/L — ABNORMAL LOW (ref 98–111)
Creatinine, Ser: 6.16 mg/dL — ABNORMAL HIGH (ref 0.44–1.00)
GFR, Estimated: 7 mL/min — ABNORMAL LOW (ref >60)
Glucose, Bld: 92 mg/dL (ref 70–99)
Potassium: 5 mmol/L (ref 3.5–5.1)
Sodium: 129 mmol/L — ABNORMAL LOW (ref 135–145)

## 2021-11-16 LAB — CBC
HCT: 43 % (ref 36.0–46.0)
Hemoglobin: 13.4 g/dL (ref 12.0–15.0)
MCH: 28.1 pg (ref 26.0–34.0)
MCHC: 31.2 g/dL (ref 30.0–36.0)
MCV: 90.1 fL (ref 80.0–100.0)
Platelets: 179 10*3/uL (ref 150–400)
RBC: 4.77 MIL/uL (ref 3.87–5.11)
RDW: 16.2 % — ABNORMAL HIGH (ref 11.5–15.5)
WBC: 6.8 10*3/uL (ref 4.0–10.5)
nRBC: 0 % (ref 0.0–0.2)

## 2021-11-16 LAB — CBG MONITORING, ED
Glucose-Capillary: 147 mg/dL — ABNORMAL HIGH (ref 70–99)
Glucose-Capillary: 85 mg/dL (ref 70–99)

## 2021-11-16 LAB — GLUCOSE, CAPILLARY
Glucose-Capillary: 83 mg/dL (ref 70–99)
Glucose-Capillary: 99 mg/dL (ref 70–99)

## 2021-11-16 MED ORDER — SODIUM CHLORIDE 0.9 % IV SOLN: 100.0000 mL | INTRAVENOUS | Status: DC | PRN

## 2021-11-16 MED ORDER — LIDOCAINE-PRILOCAINE 2.5-2.5 % EX CREA: 1.0000 "application " | TOPICAL_CREAM | CUTANEOUS | Status: DC | PRN

## 2021-11-16 MED ORDER — ARTIFICIAL TEARS OPHTHALMIC OINT
TOPICAL_OINTMENT | Freq: Three times a day (TID) | OPHTHALMIC | Status: DC
  Filled 2021-11-16: qty 3.5

## 2021-11-16 MED ORDER — ONDANSETRON HCL 4 MG/2ML IJ SOLN
INTRAMUSCULAR | Status: AC
  Filled 2021-11-16: qty 2

## 2021-11-16 MED ORDER — ALTEPLASE 2 MG IJ SOLR
2.0000 mg | Freq: Once | INTRAMUSCULAR | Status: DC | PRN
  Filled 2021-11-16: qty 2

## 2021-11-16 MED ORDER — PENTAFLUOROPROP-TETRAFLUOROETH EX AERO
1.0000 "application " | INHALATION_SPRAY | CUTANEOUS | Status: DC | PRN
  Filled 2021-11-16: qty 30

## 2021-11-16 MED ORDER — HYDRALAZINE HCL 50 MG PO TABS
50.0000 mg | ORAL_TABLET | Freq: Three times a day (TID) | ORAL | Status: DC
  Administered 2021-11-16 – 2021-11-17 (×2): 50 mg via ORAL
  Filled 2021-11-16 (×2): qty 1

## 2021-11-16 MED ORDER — ONDANSETRON HCL 4 MG PO TABS: 4.0000 mg | ORAL_TABLET | Freq: Three times a day (TID) | ORAL | Status: DC | PRN

## 2021-11-16 MED ORDER — HEPARIN SODIUM (PORCINE) 1000 UNIT/ML IJ SOLN
INTRAMUSCULAR | Status: AC
  Filled 2021-11-16: qty 10

## 2021-11-16 MED ORDER — HYDROMORPHONE HCL 1 MG/ML IJ SOLN
0.5000 mg | INTRAMUSCULAR | Status: DC | PRN
  Administered 2021-11-16: 0.5 mg via INTRAVENOUS
  Filled 2021-11-16: qty 0.5

## 2021-11-16 MED ORDER — SUCRALFATE 1 G PO TABS
1.0000 g | ORAL_TABLET | Freq: Four times a day (QID) | ORAL | Status: DC
  Administered 2021-11-16 – 2021-11-17 (×2): 1 g via ORAL
  Filled 2021-11-16 (×3): qty 1

## 2021-11-16 MED ORDER — CHLORHEXIDINE GLUCONATE CLOTH 2 % EX PADS
6.0000 | MEDICATED_PAD | Freq: Every day | CUTANEOUS | Status: DC
  Administered 2021-11-16: 6 via TOPICAL
  Filled 2021-11-16: qty 6

## 2021-11-16 MED ORDER — HEPARIN SODIUM (PORCINE) 1000 UNIT/ML DIALYSIS
1000.0000 [IU] | INTRAMUSCULAR | Status: DC | PRN
  Filled 2021-11-16: qty 1

## 2021-11-16 MED ORDER — FUROSEMIDE 40 MG PO TABS
80.0000 mg | ORAL_TABLET | Freq: Every day | ORAL | Status: DC
  Administered 2021-11-16 – 2021-11-17 (×2): 80 mg via ORAL
  Filled 2021-11-16 (×2): qty 2

## 2021-11-16 MED ORDER — LIDOCAINE HCL (PF) 1 % IJ SOLN
5.0000 mL | INTRAMUSCULAR | Status: DC | PRN
  Filled 2021-11-16: qty 5

## 2021-11-16 MED ORDER — OXYCODONE-ACETAMINOPHEN 5-325 MG PO TABS
1.0000 | ORAL_TABLET | ORAL | Status: DC | PRN
  Administered 2021-11-17: 1 via ORAL
  Filled 2021-11-16: qty 1

## 2021-11-16 MED ORDER — ALBUTEROL SULFATE (2.5 MG/3ML) 0.083% IN NEBU
2.5000 mg | INHALATION_SOLUTION | RESPIRATORY_TRACT | Status: DC | PRN
Start: 2021-11-15 — End: 2021-11-17

## 2021-11-16 MED ORDER — PANCRELIPASE (LIP-PROT-AMYL) 12000-38000 UNITS PO CPEP
24000.0000 [IU] | ORAL_CAPSULE | Freq: Three times a day (TID) | ORAL | Status: DC
  Administered 2021-11-17: 24000 [IU] via ORAL
  Filled 2021-11-16: qty 2

## 2021-11-16 NOTE — Progress Notes (Signed)
[name]  MRN: [id number]  DOB/AGE: [birth year] 61 y.o.  Primary Care Physician:Patel, Judson Roch, MD  Admit date: 11/15/2021  Chief Complaint:  Chief Complaint  Patient presents with   Chest Pain    S-Pt presented on  11/15/2021 with  Chief Complaint  Patient presents with   Chest Pain  . Patient is a 61 year old African-American female with a past medical history of ESRD, on hemodialysis on Monday Wednesday Friday schedule, COPD, coronary artery disease, chronic diastolic CHF, peripheral arterial disease, diabetes mellitus with diabetic neuropathy and gastroparesis who came to the ER with chief complaint of chest pain.  History of present illness date back to past to 3 days ago when patient had indigestion and later went to dialysis yesterday.  Patient complaining of epigastric pain which was radiating to the chest to the patient was sent to the ER.  Upon evaluation in the ER patient had CT scan done which showed patient had masslike structure in the stomach .  patient also had NG tube placed in the ER and upon irrigation patient was found to have pinkish fluid. Patient was admitted for further treatment.  Nephrology was consulted for comanagement of dialysis patient  Meds   amLODipine  10 mg Oral Daily   atorvastatin  20 mg Oral QPM   feeding supplement (NEPRO CARB STEADY)  237 mL Oral BID BM   gabapentin  100 mg Oral QHS   insulin aspart  0-9 Units Subcutaneous TID WC   insulin detemir  2 Units Subcutaneous Daily   [START ON 11/19/2021] pantoprazole  40 mg Intravenous Q12H   sevelamer carbonate  800 mg Oral TID WC & HS   cyanocobalamin  1,000 mcg Oral Daily         IDP:OEUMP from the symptoms mentioned above,there are no other symptoms referable to all systems reviewed.  Physical Exam: Vital signs in last 24 hours: Temp:  [98 F (36.7 C)-98.2 F (36.8 C)] 98.2 F (36.8 C) (02/24 2251) Pulse Rate:  [57-92] 62 (02/25 0700) Resp:  [8-18] 11 (02/25 0700) BP:  (117-153)/(50-80) 140/57 (02/25 0700) SpO2:  [99 %-100 %] 100 % (02/25 0700) Weight:  [73 kg] 73 kg (02/24 1213) Weight change:     Intake/Output from previous day: No intake/output data recorded. No intake/output data recorded.   Physical Exam:  General- pt is awake,alert, oriented to time place and person  Resp- No acute REsp distress, NO Rhonchi  CVS- S1S2 regular in rate and rhythm  GIT- BS+, soft, Non tender , Non distended  EXT- No LE Edema,  No Cyanosis, Chronic venous stasis changes  Access- R-tunneled cath   Lab Results:  CBC  Recent Labs    11/15/21 1220 11/15/21 1711 11/16/21 0638  WBC 7.3  --  6.8  HGB 14.3 14.3 13.4  HCT 46.2* 46.1* 43.0  PLT 194  --  179    BMET  Recent Labs    11/15/21 1220 11/16/21 0638  NA 130* 129*  K 4.5 5.0  CL 92* 93*  CO2 26 22  GLUCOSE 149* 92  BUN 35* 40*  CREATININE 5.61* 6.16*  CALCIUM 8.4* 7.8*      Most recent Creatinine trend  Lab Results  Component Value Date   CREATININE 6.16 (H) 11/16/2021   CREATININE 5.61 (H) 11/15/2021   CREATININE 7.48 (H) 09/25/2021      MICRO   Recent Results (from the past 240 hour(s))  Resp Panel by RT-PCR (Flu A&B, Covid) Nasopharyngeal Swab  Status: None   Collection Time: 11/15/21  1:38 PM   Specimen: Nasopharyngeal Swab; Nasopharyngeal(NP) swabs in vial transport medium  Result Value Ref Range Status   SARS Coronavirus 2 by RT PCR NEGATIVE NEGATIVE Final    Comment: (NOTE) SARS-CoV-2 target nucleic acids are NOT DETECTED.  The SARS-CoV-2 RNA is generally detectable in upper respiratory specimens during the acute phase of infection. The lowest concentration of SARS-CoV-2 viral copies this assay can detect is 138 copies/mL. A negative result does not preclude SARS-Cov-2 infection and should not be used as the sole basis for treatment or other patient management decisions. A negative result may occur with  improper specimen collection/handling, submission  of specimen other than nasopharyngeal swab, presence of viral mutation(s) within the areas targeted by this assay, and inadequate number of viral copies(<138 copies/mL). A negative result must be combined with clinical observations, patient history, and epidemiological information. The expected result is Negative.  Fact Sheet for Patients:  EntrepreneurPulse.com.au  Fact Sheet for Healthcare Providers:  IncredibleEmployment.be  This test is no t yet approved or cleared by the [place] FDA and  has been authorized for detection and/or diagnosis of SARS-CoV-2 by FDA under an Emergency Use Authorization (EUA). This EUA will remain  in effect (meaning this test can be used) for the duration of the COVID-19 declaration under Section 564(b)(1) of the Act, 21 U.S.C.section 360bbb-3(b)(1), unless the authorization is terminated  or revoked sooner.       Influenza A by PCR NEGATIVE NEGATIVE Final   Influenza B by PCR NEGATIVE NEGATIVE Final    Comment: (NOTE) The Xpert Xpress SARS-CoV-2/FLU/RSV plus assay is intended as an aid in the diagnosis of influenza from Nasopharyngeal swab specimens and should not be used as a sole basis for treatment. Nasal washings and aspirates are unacceptable for Xpert Xpress SARS-CoV-2/FLU/RSV testing.  Fact Sheet for Patients: EntrepreneurPulse.com.au  Fact Sheet for Healthcare Providers: IncredibleEmployment.be  This test is not yet approved or cleared by the [place] FDA and has been authorized for detection and/or diagnosis of SARS-CoV-2 by FDA under an Emergency Use Authorization (EUA). This EUA will remain in effect (meaning this test can be used) for the duration of the COVID-19 declaration under Section 564(b)(1) of the Act, 21 U.S.C. section 360bbb-3(b)(1), unless the authorization is terminated or revoked.  Performed at [facility], [address], [place], [place] [address]      CT abdomen done yesterday-on November 15, 2021 IMPRESSION: 1. Hyperdense material in the stomach, which is suspicious for acute gastric bleed. Clinical correlation is suggested.   2. Diffuse wall thickening of the descending and sigmoid colon concerning for colitis.   3.  Chronic atrophy of bilateral kidneys.   4. Marked generalized anasarca with subcutaneous edema about the abdominal wall and bilateral lower extremities, likely suggesting volume overload.   5.  Trace bilateral pleural effusions.   6.  Cholecystectomy changes.   7. Marked atherosclerotic disease of abdominal aorta and branch vessels.   8. Sigmoid colonic diverticulosis without evidence of acute diverticulitis.   Impression:   Patient is a 61 year old African-American female with a past medical history of end-stage renal disease, COPD, coronary artery disease, chronic diastolic CHF, peripheral arterial disease, diabetes mellitus type 2, diabetic neuropathy, diabetic gastroparesis who comes to the hospital with chief complaint of abdominal pain Patient is admitted with Abdominal pain Upper GI bleed Diabetes mellitus End-stage renal disease Coronary artery disease   1)Renal    End-stage renal disease Patient is  on hemodialysis Patient is on Monday Wednesday Friday schedule Patient did not have her dialysis as an outpatient yesterday  Patient will be dialyzed today   2)HTN   Blood pressure is stable    3)Anemia of chronic disease  CBC Latest Ref Rng & Units 11/16/2021 11/15/2021 11/15/2021  WBC 4.0 - 10.5 K/uL 6.8 - 7.3  Hemoglobin 12.0 - 15.0 g/dL 13.4 14.3 14.3  Hematocrit 36.0 - 46.0 % 43.0 46.1(H) 46.2(H)  Platelets 150 - 400 K/uL 179 - 194      Patient has a history of anemia of chronic disease with hemoglobin being around 9.6 on September 25, 2021 Patient hemoglobin is currently more than 12 we will hold off on giving patient any Epogen   4) Secondary  hyperparathyroidism -CKD Mineral-Bone Disorder    Lab Results  Component Value Date   PTH 357 (H) 10/05/2018   CALCIUM 7.8 (L) 11/16/2021   CAION 1.09 (L) 04/13/2020   PHOS 3.8 05/10/2021    Secondary Hyperparathyroidism present  Phosphorus at goal.   5)Abdominal pain/upper GI bleed primary team is following GI has been consulted   6) Electrolytes   BMP Latest Ref Rng & Units 11/16/2021 11/15/2021 09/25/2021  Glucose 70 - 99 mg/dL 92 149(H) 216(H)  BUN 8 - 23 mg/dL 40(H) 35(H) 66(H)  Creatinine 0.44 - 1.00 mg/dL 6.16(H) 5.61(H) 7.48(H)  Sodium 135 - 145 mmol/L 129(L) 130(L) 129(L)  Potassium 3.5 - 5.1 mmol/L 5.0 4.5 4.2  Chloride 98 - 111 mmol/L 93(L) 92(L) 89(L)  CO2 22 - 32 mmol/L 22 26 23   Calcium 8.9 - 10.3 mg/dL 7.8(L) 8.4(L) 8.0(L)     Sodium Hyponatremia Secondary to ESRD   Potassium Normokalemic    7)Acid base    Co2 at goal     Plan:    We will dialyze patient today We will try to remove 2 L Will not give any heparin as patient is having GI bleed Will not give patient any Epogen as patient hemoglobin is more than 12    Tasheem Elms s [facility] 11/16/2021, 8:39 AM

## 2021-11-16 NOTE — Progress Notes (Signed)
°  Progress Note   Patient: [name] YTK:[id number] DOB: [birth year] DOA: 11/15/2021     1 DOS: the patient was seen and examined on 11/16/2021   Brief hospital course:  [name] is a 61 y.o. female with medical history significant of multiple medical problems including end-stage renal disease, COPD, coronary disease with chronic diastolic congestive heart failure, peripheral arterial disease, type 2 diabetes with diabetic peripheral neuropathy and gastroparesis who presents to the hospital complaining of upper abdominal pain. CT abdomen/pelvis with contrast showed a mass structure in the stomach.  NG tube was placed, suction out pink-colored blood.  NG tube was subsequently removed afterwards. Per GI recommendation, patient was started on Protonix drip. Assessment and Plan:  Abdominal pain. Upper GI bleed. Abdominal pain has resolved after multiple bowel movements last night. Hemoglobin still stable.  Currently pending GI consult. Patient probably can go home tomorrow if hemoglobin still stable and no further bleeding.  Type 1 diabetes. Well-controlled.  Continue lower dose Levemir and sliding scale insulin  End-stage renal disease. Hyponatremia. Spoke with nephrology, patient will be dialyzed today.   Coronary artery disease. Chronic diastolic congestive heart failure. Conditions are stable.  COPD  No bronchospasm.     Subjective:  Patient had multiple loose stools overnight, patient did not notice any colors of stool, daytime nurse also has no information about the stool. But condition had improved, patient no longer has any abdominal pain or nausea vomiting.  Physical Exam: Vitals:   11/16/21 0400 11/16/21 0500 11/16/21 0600 11/16/21 0700  BP: (!) 138/59 (!) 138/58 (!) 126/54 (!) 140/57  Pulse: (!) 59 (!) 57 (!) 59 62  Resp: 10 10 10 11   Temp:      TempSrc:      SpO2: 100% 100% 99% 100%  Weight:      Height:       General exam: Appears calm and comfortable   Respiratory system: Clear to auscultation. Respiratory effort normal. Cardiovascular system: S1 & S2 heard, RRR. No JVD, murmurs, rubs, gallops or clicks. No pedal edema. Gastrointestinal system: Abdomen is nondistended, soft and nontender. No organomegaly or masses felt. Normal bowel sounds heard. Central nervous system: Alert and oriented. No focal neurological deficits. Extremities: Symmetric 5 x 5 power. Skin: No rashes, lesions or ulcers Psychiatry: Judgement and insight appear normal. Mood & affect appropriate.    Data Reviewed: Reviewed all labs.  Family Communication:   Disposition: Status is: Inpatient Remains inpatient appropriate because: Severity of disease, need to 1 more day of observation to make sure normal bleeding.  Continue IV Protonix          Planned Discharge Destination: Home     Time spent: 25 minutes  Author: Sharen Hones, MD 11/16/2021 10:21 AM  For on call review [email or web address].

## 2021-11-16 NOTE — Progress Notes (Signed)
Asked patient if she wanted her breakfast; patient states "do you see how I'm laying?" Asked patient how she wanted to be repositioned. Patient states "my eye is swollen." Repeatedly asked patient if she wanted her breakfast and how she wanted to be positioned. Patient refusing to answer questions at this time. Coffee and juice spilled off breakfast tray; RN asked patient if she wanted writer to call and order a new breakfast tray. Patient refusing at this time.

## 2021-11-16 NOTE — Consult Note (Signed)
[name], MD [facility]  [address], [place], [place] [address] Phone: [phone] Fax : [phone]  Consultation  Referring Provider:     Dr. Cinda Quest Primary Care Physician:  Denton Lank, MD Primary Gastroenterologist:  [facility]         Reason for Consultation:     GI bleed  Date of Admission:  11/15/2021 Date of Consultation:  11/16/2021         HPI:   [name] is a 61 y.o. female who is brought to the ER with a history of diabetes hypertension end-stage renal disease and gastroparesis who is followed by [facility].  The patient was reporting severe abdominal pain and was set up for CT scan of the abdomen.  The CT scan showed:  IMPRESSION: 1. Hyperdense material in the stomach, which is suspicious for acute gastric bleed. Clinical correlation is suggested. 2. Diffuse wall thickening of the descending and sigmoid colon concerning for colitis. 3.  Chronic atrophy of bilateral kidneys. 4. Marked generalized anasarca with subcutaneous edema about the abdominal wall and bilateral lower extremities, likely suggesting volume overload. 5.  Trace bilateral pleural effusions. 6.  Cholecystectomy changes. 7. Marked atherosclerotic disease of abdominal aorta and branch vessels. 8. Sigmoid colonic diverticulosis without evidence of acute diverticulitis.   The patient had a CBC sent off with her hemoglobin 1 month ago 5 months ago and 6 months ago being less than 10 and she was admitted with a hemoglobin of 14.3 with a repeat being the same at 14.3 and then this morning being 13.4.  She also reports that after she came to the ER she had diarrhea with her abdominal pain.  The patient was started on a PPI and today she states her abdominal pain is much better and she is eating her meal when I came to see her today.  The patient also reports that the diarrhea has completely resolved.  The patient did have an NG tube placed with some pink material which may have been the scope trauma since the  patient's hemoglobin has been stable.  The patient has had a work-up in the past for anemia with a EGD and colonoscopy in 2017.   Past Medical History:  Diagnosis Date   Anemia    Anginal pain ([place])    Anxiety    Arthritis    Asthma    Broken wrist    Bronchitis    chronic diastolic CHF 03/21/1600   Chronic kidney disease    COPD (chronic obstructive pulmonary disease) ([place])    Coronary artery disease    a. cath 2013: stenting to RCA (report not available); b. cath 2014: LM nl, pLAD 40%, mLAD nl, ost LCx 40%, mid LCx nl, pRCA 30% @ site of prior stent, mRCA 50%   Depression    Diabetes mellitus ([facility] [facility])    Diabetes mellitus without complication ([place])    Diabetic neuropathy ([place])    dialysis 2006   Diverticulosis    Dizziness    Dyspnea    Elevated lipids    Environmental and seasonal allergies    ESRD (end stage renal disease) on dialysis ([place])    M-W-F   Gastroparesis    GERD (gastroesophageal reflux disease)    Headache    History of anemia due to chronic kidney disease    History of hiatal hernia    HOH (hard of hearing)    Hx of pancreatitis 2015   Hypertension    Lower extremity  edema    Mitral regurgitation    a. echo 10/2013: EF 62%, noWMA, mildly dilated LA, mild to mod MR/TR, GR1DD   Myocardial infarction ([place])    Orthopnea    Parathyroid abnormality ([place])    Peripheral arterial disease ([place])    Pneumonia    Renal cancer ([place])    Renal insufficiency    Pt is on dialysis on M,W + F.   Wheezing     Past Surgical History:  Procedure Laterality Date   A/V SHUNTOGRAM Left 01/20/2018   Procedure: A/V SHUNTOGRAM;  Surgeon: Algernon Huxley, MD;  Location: [place] [facility];  Service: Cardiovascular;  Laterality: Left;   ABDOMINAL HYSTERECTOMY  1992   AMPUTATION TOE Left 10/02/2017   Procedure: AMPUTATION TOE-LEFT GREAT TOE;  Surgeon: Albertine Patricia, DPM;  Location: [facility];  Service: Podiatry;  Laterality: Left;   APPENDECTOMY     APPLICATION OF WOUND VAC N/A  11/25/2019   Procedure: APPLICATION OF WOUND VAC;  Surgeon: Katha Cabal, MD;  Location: [facility];  Service: Vascular;  Laterality: N/A;   ARTERY BIOPSY Right 10/11/2018   Procedure: BIOPSY TEMPORAL ARTERY;  Surgeon: Vickie Epley, MD;  Location: [facility];  Service: General;  Laterality: Right;   CARDIAC CATHETERIZATION Left 07/26/2015   Procedure: Left Heart Cath and Coronary Angiography;  Surgeon: Dionisio David, MD;  Location: [place] [facility];  Service: Cardiovascular;  Laterality: Left;   CATARACT EXTRACTION W/ INTRAOCULAR LENS IMPLANT Right    CATARACT EXTRACTION W/PHACO Left 03/10/2017   Procedure: CATARACT EXTRACTION PHACO AND INTRAOCULAR LENS PLACEMENT ([place]);  Surgeon: Birder Robson, MD;  Location: [facility];  Service: Ophthalmology;  Laterality: Left;  [place] 00:51.9 AP% 14.2 CDE 7.39 fluid pack lot # [id number]   CENTRAL LINE INSERTION Right 11/11/2019   Procedure: CENTRAL LINE INSERTION;  Surgeon: Katha Cabal, MD;  Location: [facility];  Service: Vascular;  Laterality: Right;   CENTRAL LINE INSERTION  11/25/2019   Procedure: CENTRAL LINE INSERTION;  Surgeon: Katha Cabal, MD;  Location: [facility];  Service: Vascular;;   CHOLECYSTECTOMY     COLONOSCOPY WITH PROPOFOL N/A 08/12/2016   Procedure: COLONOSCOPY WITH PROPOFOL;  Surgeon: Lollie Sails, MD;  Location: [facility];  Service: Endoscopy;  Laterality: N/A;   DIALYSIS FISTULA CREATION Left    upper arm   dialysis grafts     DIALYSIS/PERMA CATHETER INSERTION N/A 11/14/2019   Procedure: DIALYSIS/PERMA CATHETER INSERTION;  Surgeon: Algernon Huxley, MD;  Location: [place] [facility];  Service: Cardiovascular;  Laterality: N/A;   DIALYSIS/PERMA CATHETER INSERTION N/A 02/03/2020   Procedure: DIALYSIS/PERMA CATHETER INSERTION;  Surgeon: Katha Cabal, MD;  Location: [place] [facility];  Service: Cardiovascular;  Laterality: N/A;   DIALYSIS/PERMA CATHETER INSERTION N/A 06/19/2020   Procedure: DIALYSIS/PERMA  CATHETER INSERTION;  Surgeon: Katha Cabal, MD;  Location: [place] [facility];  Service: Cardiovascular;  Laterality: N/A;   DIALYSIS/PERMA CATHETER INSERTION N/A 05/07/2021   Procedure: DIALYSIS/PERMA CATHETER INSERTION;  Surgeon: Katha Cabal, MD;  Location: [place] [facility];  Service: Cardiovascular;  Laterality: N/A;  Patient needs general anethesia   DIALYSIS/PERMA CATHETER REMOVAL N/A 05/25/2020   Procedure: DIALYSIS/PERMA CATHETER REMOVAL;  Surgeon: Katha Cabal, MD;  Location: [place] [facility];  Service: Cardiovascular;  Laterality: N/A;   ESOPHAGOGASTRODUODENOSCOPY N/A 03/08/2015   Procedure: ESOPHAGOGASTRODUODENOSCOPY (EGD);  Surgeon: Manya Silvas, MD;  Location: [facility] ENDOSCOPY;  Service: Endoscopy;  Laterality: N/A;   ESOPHAGOGASTRODUODENOSCOPY (EGD) WITH PROPOFOL  N/A 03/18/2016   Procedure: ESOPHAGOGASTRODUODENOSCOPY (EGD) WITH PROPOFOL;  Surgeon: [name], MD;  Location: [facility] ENDOSCOPY;  Service: Endoscopy;  Laterality: N/A;   EYE SURGERY Right 2018   FECAL TRANSPLANT N/A 08/23/2015   Procedure: FECAL TRANSPLANT;  Surgeon: Manya Silvas, MD;  Location: [facility] ENDOSCOPY;  Service: Endoscopy;  Laterality: N/A;   HAND SURGERY Bilateral    HEMATOMA EVACUATION Left 11/25/2019   Procedure: EVACUATION HEMATOMA;  Surgeon: Katha Cabal, MD;  Location: [facility];  Service: Vascular;  Laterality: Left;   I & D EXTREMITY Left 11/25/2019   Procedure: IRRIGATION AND DEBRIDEMENT EXTREMITY;  Surgeon: Katha Cabal, MD;  Location: [facility];  Service: Vascular;  Laterality: Left;   IR FLUORO GUIDE CV LINE RIGHT  04/06/2020   IR INJECT/THERA/INC NEEDLE/CATH/PLC EPI/CERV/THOR W/IMG  08/13/2020   IR RADIOLOGIST EVAL & MGMT  07/28/2019   IR RADIOLOGIST EVAL & MGMT  08/11/2019   LIGATION OF ARTERIOVENOUS  FISTULA Left 11/11/2019   LIGATION OF ARTERIOVENOUS  FISTULA Left 11/11/2019   Procedure: LIGATION OF ARTERIOVENOUS  FISTULA;  Surgeon: Katha Cabal, MD;   Location: [facility];  Service: Vascular;  Laterality: Left;   LIGATIONS OF HERO GRAFT Right 06/13/2020   Procedure: LIGATION / REMOVAL OF RIGHT HERO GRAFT;  Surgeon: Katha Cabal, MD;  Location: [facility];  Service: Vascular;  Laterality: Right;   PERIPHERAL VASCULAR CATHETERIZATION N/A 12/20/2015   Procedure: Thrombectomy of dialysis access versus permcath placement;  Surgeon: Algernon Huxley, MD;  Location: [place] [facility];  Service: Cardiovascular;  Laterality: N/A;   PERIPHERAL VASCULAR CATHETERIZATION N/A 12/20/2015   Procedure: A/V Shunt Intervention;  Surgeon: Algernon Huxley, MD;  Location: [place] [facility];  Service: Cardiovascular;  Laterality: N/A;   PERIPHERAL VASCULAR CATHETERIZATION N/A 12/20/2015   Procedure: A/V Shuntogram/Fistulagram;  Surgeon: Algernon Huxley, MD;  Location: [place] [facility];  Service: Cardiovascular;  Laterality: N/A;   PERIPHERAL VASCULAR CATHETERIZATION N/A 01/02/2016   Procedure: A/V Shuntogram/Fistulagram;  Surgeon: Algernon Huxley, MD;  Location: [place] [facility];  Service: Cardiovascular;  Laterality: N/A;   PERIPHERAL VASCULAR CATHETERIZATION N/A 01/02/2016   Procedure: A/V Shunt Intervention;  Surgeon: Algernon Huxley, MD;  Location: [place] [facility];  Service: Cardiovascular;  Laterality: N/A;   TEE WITHOUT CARDIOVERSION N/A 06/11/2020   Procedure: TRANSESOPHAGEAL ECHOCARDIOGRAM (TEE);  Surgeon: Teodoro Spray, MD;  Location: [facility];  Service: Cardiovascular;  Laterality: N/A;   UPPER EXTREMITY ANGIOGRAPHY Bilateral 11/27/2020   Procedure: UPPER EXTREMITY ANGIOGRAPHY;  Surgeon: Katha Cabal, MD;  Location: [place] [facility];  Service: Cardiovascular;  Laterality: Bilateral;   UPPER EXTREMITY VENOGRAPHY Right 01/18/2020   Procedure: UPPER EXTREMITY VENOGRAPHY;  Surgeon: Katha Cabal, MD;  Location: [place] [facility];  Service: Cardiovascular;  Laterality: Right;   UPPER EXTREMITY VENOGRAPHY Bilateral 11/27/2020   Procedure: UPPER EXTREMITY  VENOGRAPHY;  Surgeon: Katha Cabal, MD;  Location: [place] [facility];  Service: Cardiovascular;  Laterality: Bilateral;   UPPER EXTREMITY VENOGRAPHY Left 11/05/2021   Procedure: UPPER EXTREMITY VENOGRAPHY;  Surgeon: Katha Cabal, MD;  Location: [place] [facility];  Service: Cardiovascular;  Laterality: Left;   VASCULAR ACCESS DEVICE INSERTION Right 04/13/2020   Procedure: INSERTION OF HERO VASCULAR ACCESS DEVICE (GRAFT);  Surgeon: Katha Cabal, MD;  Location: [facility];  Service: Vascular;  Laterality: Right;    Prior to Admission medications   Medication Sig Start Date End Date Taking? Authorizing Provider  albuterol (  PROVENTIL) (2.5 MG/3ML) 0.083% nebulizer solution Take 2.5 mg by nebulization every 4 (four) hours as needed for wheezing or shortness of breath.   Yes [provider]  albuterol (VENTOLIN HFA) 108 (90 Base) MCG/ACT inhaler Inhale 2 puffs into the lungs every 4 (four) hours as needed for wheezing or shortness of breath. 11/09/19  Yes [provider]  alum & mag hydroxide-simeth (MAALOX/MYLANTA) 200-200-20 MG/5ML suspension Take 15 mLs by mouth every 6 (six) hours as needed for indigestion or heartburn. 06/25/20  Yes Enzo Bi, MD  amLODipine (NORVASC) 10 MG tablet Take 1 tablet (10 mg total) by mouth daily. 06/25/20  Yes Enzo Bi, MD  ammonium lactate (LAC-HYDRIN) 12 % lotion Apply 1 application topically 2 (two) times daily as needed for dry skin.    Yes [provider]  aspirin EC 81 MG tablet Take 81 mg by mouth daily.   Yes [provider]  atorvastatin (LIPITOR) 20 MG tablet Take 20 mg by mouth every evening.  10/24/19  Yes [provider]  Biotin 1 MG CAPS Take 1 mg by mouth daily.   Yes [provider]  calcium carbonate (TUMS - DOSED IN MG ELEMENTAL CALCIUM) 500 MG chewable tablet Chew 2.5 tablets (500 mg of elemental calcium total) by mouth every 6 (six) hours as needed for indigestion. 06/25/20  Yes Enzo Bi, MD  Cholecalciferol (VITAMIN D3) 25 MCG (1000 UT) CAPS Take 1,000 Units by mouth daily.    Yes [provider]  cyanocobalamin 1000 MCG tablet Take 1,000 mcg by mouth daily.    Yes [provider]  diphenhydrAMINE (BENADRYL) 25 MG tablet Take 25 mg by mouth every 6 (six) hours as needed for itching.   Yes [provider]  epoetin alfa (EPOGEN) 10000 UNIT/ML injection Inject 1 mL (10,000 Units total) into the vein every Monday, Wednesday, and Friday with hemodialysis. 06/27/20  Yes Enzo Bi, MD  Ferrous Sulfate (IRON) 325 (65 Fe) MG TABS Take 325 mg by mouth daily.    Yes [provider]  fluticasone (FLONASE) 50 MCG/ACT nasal spray Place 2 sprays into both nostrils daily. 10/20/21  Yes [provider]  furosemide (LASIX) 80 MG tablet Take 80 mg by mouth daily. 11/03/21  Yes [provider]  gabapentin (NEURONTIN) 100 MG capsule Take 100 mg by mouth at bedtime.   Yes [provider]  hydrALAZINE (APRESOLINE) 100 MG tablet Take 100 mg by mouth 3 (three) times daily. 11/06/21  Yes [provider]  HYDROcodone-acetaminophen (NORCO/VICODIN) 5-325 MG tablet Take 1 tablet by mouth 3 (three) times daily as needed for moderate pain or severe pain. 05/10/21  Yes Wouk, Ailene Rud, MD  hydrOXYzine (ATARAX/VISTARIL) 25 MG tablet Take 25 mg by mouth daily as needed for itching.   Yes [provider]  insulin lispro (HUMALOG) 100 UNIT/ML injection Inject 2 Units into the skin 2 (two) times daily.   Yes [provider]  lidocaine (LIDODERM) 5 % Place 1 patch onto the skin daily. (Remove after 12 hours -- leave off for 12 hours before applying new patch)   Yes [provider]  meclizine (ANTIVERT) 25 MG tablet Take 25 mg by mouth every 6 (six) hours as needed for dizziness.   Yes [provider]  mirtazapine (REMERON) 7.5 MG tablet Take 7.5 mg by mouth daily. 04/15/21  Yes [provider]   multivitamin (RENA-VIT) TABS tablet Take 1 tablet by mouth daily.    Yes [provider]  mupirocin  ointment (BACTROBAN) 2 % Apply 1 application topically daily as needed (leg rash).  11/09/19  Yes [provider]  Nutritional Supplements (FEEDING SUPPLEMENT, NEPRO CARB STEADY,) LIQD Take 237 mLs by mouth 2 (two) times daily between meals. 12/17/20  Yes Lorella Nimrod, MD  pantoprazole (PROTONIX) 40 MG tablet Take 40 mg by mouth daily. 08/28/19  Yes [provider]  polyethylene glycol (MIRALAX / GLYCOLAX) 17 g packet Take 17 g by mouth daily as needed. Patient taking differently: Take 17 g by mouth daily as needed for mild constipation or moderate constipation. 04/02/21  Yes Nolberto Hanlon, MD  Probiotic Product (PROBIOTIC PO) Take 1 capsule by mouth daily.   Yes [provider]  promethazine (PHENERGAN) 12.5 MG tablet Take 1 tablet (12.5 mg total) by mouth every 6 (six) hours as needed for nausea or vomiting. 05/10/21  Yes Wouk, Ailene Rud, MD  sevelamer carbonate (RENVELA) 800 MG tablet Take 800 mg by mouth 4 (four) times daily. (Take with meals and snack)   Yes [provider]  vitamin E 180 MG (400 UNITS) capsule Take 400 Units by mouth daily.   Yes [provider]  nitroGLYCERIN (NITROSTAT) 0.4 MG SL tablet Place 0.4 mg under the tongue every 5 (five) minutes as needed for chest pain.     [provider]    Family History  Problem Relation Age of Onset   Kidney disease Mother    Diabetes Mother    Cancer Father    Kidney disease Sister      Social History   Tobacco Use   Smoking status: Never   Smokeless tobacco: Never  Vaping Use   Vaping Use: Never used  Substance Use Topics   Alcohol use: Not Currently    Comment: glass wine week per pt   Drug use: Yes    Types: Marijuana    Comment: once a day    Allergies as of 11/15/2021 - Review Complete 11/15/2021  Allergen Reaction Noted   Ace inhibitors Swelling and  Anaphylaxis 04/25/2014   Ativan [lorazepam] Other (See Comments) 04/10/2015   Compazine [prochlorperazine edisylate] Anaphylaxis, Nausea And Vomiting, and Other (See Comments) 09/12/2009   Sumatriptan succinate Other (See Comments) 09/12/2009   Zofran [ondansetron] Nausea And Vomiting 11/11/2019   Losartan Nausea Only 03/06/2015   Prochlorperazine Other (See Comments) 08/22/2015   Reglan [metoclopramide] Other (See Comments) 04/22/2016   Scopolamine Other (See Comments) 05/21/2015   Tape Rash 02/08/2015   Tapentadol Rash 02/08/2015   Ultrasound gel Itching 11/07/2020    Review of Systems:    All systems reviewed and negative except where noted in HPI.   Physical Exam:  Vital signs in last 24 hours: Temp:  [98.2 F (36.8 C)] 98.2 F (36.8 C) (02/25 1105) Pulse Rate:  [57-92] 63 (02/25 1105) Resp:  [8-18] 18 (02/25 1105) BP: (117-153)/(49-66) 145/49 (02/25 1105) SpO2:  [99 %-100 %] 100 % (02/25 1105)   General:   Pleasant, cooperative in NAD Head:  Normocephalic and atraumatic. Eyes:   No icterus.   Conjunctiva pink. PERRLA. Ears:  Normal auditory acuity. Neck:  Supple; no masses or thyroidomegaly Lungs: Respirations even and unlabored. Lungs clear to auscultation bilaterally.   No wheezes, crackles, or rhonchi.  Heart:  Regular rate and rhythm;  Without murmur, clicks, rubs or gallops Abdomen:  Soft, nondistended, nontender. Normal bowel sounds. No appreciable masses or hepatomegaly.  No rebound or guarding.  Rectal:  Not performed. Msk:  Symmetrical without gross deformities.  Extremities:  Without edema, cyanosis or clubbing. Neurologic:  Alert and oriented x3;  grossly normal neurologically. Skin:  Intact without significant lesions or rashes. Cervical Nodes:  No significant cervical adenopathy. Psych:  Alert and cooperative. Normal affect.  LAB RESULTS: Recent Labs    11/15/21 1220 11/15/21 1711 11/16/21 0638  WBC 7.3  --  6.8  HGB 14.3 14.3 13.4  HCT 46.2*  46.1* 43.0  PLT 194  --  179   BMET Recent Labs    11/15/21 1220 11/16/21 0638  NA 130* 129*  K 4.5 5.0  CL 92* 93*  CO2 26 22  GLUCOSE 149* 92  BUN 35* 40*  CREATININE 5.61* 6.16*  CALCIUM 8.4* 7.8*   LFT Recent Labs    11/15/21 1326  PROT 8.3*  ALBUMIN 4.0  AST 28  ALT 15  ALKPHOS 87  BILITOT 0.5  BILIDIR <0.1  IBILI NOT CALCULATED   PT/INR No results for input(s): LABPROT, INR in the last 72 hours.  STUDIES: DG Chest 2 View  Result Date: 11/15/2021 CLINICAL DATA:  Central chest pain worsened with inspiration, did not start dialysis today EXAM: CHEST - 2 VIEW COMPARISON:  06/04/2021 FINDINGS: RIGHT jugular dual-lumen central venous catheter with tip projecting over inferior RIGHT atrium near cavoatrial junction. Multiple vascular stents LEFT subclavian and LEFT axillary. Enlargement of cardiac silhouette with pulmonary vascular congestion. Diffuse interstitial infiltrates consistent with pulmonary edema, greater at bases. No pleural effusion or pneumothorax. IMPRESSION: Enlargement of cardiac silhouette with pulmonary vascular congestion and diffuse pulmonary edema. Electronically Signed   By: Lavonia Dana M.D.   On: 11/15/2021 12:55   CT ABDOMEN PELVIS W CONTRAST  Result Date: 11/15/2021 CLINICAL DATA:  Abdominal pain EXAM: CT ABDOMEN AND PELVIS WITH CONTRAST TECHNIQUE: Multidetector CT imaging of the abdomen and pelvis was performed using the standard protocol following bolus administration of intravenous contrast. RADIATION DOSE REDUCTION: This exam was performed according to the departmental dose-optimization program which includes automated exposure control, adjustment of the mA and/or kV according to patient size and/or use of iterative reconstruction technique. CONTRAST:  22mL OMNIPAQUE IOHEXOL 300 MG/ML  SOLN COMPARISON:  CT examination dated April 25, 2021 FINDINGS: Lower chest: Trace bilateral pleural effusions and mild atelectasis. Cardiomegaly. Hepatobiliary: No  focal liver abnormality is seen. Status post cholecystectomy. Prominent extrahepatic common duct, likely secondary to prior cholecystectomy. Mild prominence of the central biliary ducts. Pancreas: Mild pancreatic atrophy. No pancreatic ductal dilatation or surrounding inflammatory changes. Spleen: Normal in size without focal abnormality. Adrenals/Urinary Tract: Adrenal glands are unremarkable. Marked atrophy of bilateral kidneys. Stable cyst in the interpolar region of the left kidney. Bladder is unremarkable. Stomach/Bowel: Small hiatal hernia. There is hyperdense material in the stomach, which is concerning for acute gastric gastric hemorrhage. Appendix not clearly visualized. There is diffuse thickening of the descending/sigmoid colonic wall with mucosal enhancement concerning for colitis. Sigmoid colonic diverticuli without evidence of acute diverticulitis. Vascular/Lymphatic: Advanced atherosclerotic calcification of abdominal aorta and branch vessels. No enlarged abdominal or pelvic lymph nodes. Reproductive: Status post hysterectomy. No adnexal masses. Other: Marked generalized anasarca with subcutaneous edema in bilateral extremities and about the abdomen. Musculoskeletal: Degenerate disc disease of the spine with Schmorl nodes in L2 and L3. No acute osseous abnormality. IMPRESSION: 1. Hyperdense material in the stomach, which is suspicious for acute gastric bleed. Clinical correlation is suggested. 2. Diffuse wall thickening of the descending and sigmoid colon concerning for colitis. 3.  Chronic atrophy of bilateral kidneys. 4. Marked generalized anasarca with subcutaneous  edema about the abdominal wall and bilateral lower extremities, likely suggesting volume overload. 5.  Trace bilateral pleural effusions. 6.  Cholecystectomy changes. 7. Marked atherosclerotic disease of abdominal aorta and branch vessels. 8. Sigmoid colonic diverticulosis without evidence of acute diverticulitis. Findings suspicious for  acute gastric bleed were reported to Dr. Arcola Jansky at approximately 4:05 p.m. on 11/15/2021. Electronically Signed   By: Keane Police D.O.   On: 11/15/2021 16:10      Impression / Plan:   Assessment: Principal Problem:   Acute GI bleeding Active Problems:   End-stage renal disease on hemodialysis (HCC)   Type 1 diabetes mellitus (HCC)   Obesity (BMI 30-39.9)   Upper GI bleed   [name] is a 61 y.o. y/o female with who comes in with severe abdominal pain and developed diarrhea while she was here.  The patient's symptoms have completely resolved and she has not had a bowel movement all day.  She also was able to eat without any pain.  The patient was put on a PPI.  The patient's hemoglobin has been stable and has been normal.  There is no sign of any GI bleeding.  Plan:  Nothing further to do from a GI point of view at this time.  The patient should follow-up with her primary care gastroenterologist at [facility] as an outpatient.  The patient has been explained the plan agrees with it.  I tried to call her son and inform her of my recommendations but only received voicemail.  Thank you for involving me in the care of this patient.      LOS: 1 day   [name], MD, [facility] 11/16/2021, 2:11 PM,  Pager [phone] 7am-5pm  Check AMION for 5pm -7am coverage and on weekends   Note: This dictation was prepared with [name] dictation along with smaller phrase technology. Any transcriptional errors that result from this process are unintentional.

## 2021-11-16 NOTE — Progress Notes (Signed)
Patient presents for 3-hour hemodialysis treatment, Pronotix infusing via PIV to right wrist.  Right chest catheter intact, no signs of infection. Patient complains of N/V has emesis bag at hand, request medication, unable to secure due to allergies.  Dialysis treatment initiated, but arterial pressure elevated, BFR reduced, on call nephrologist notified of findings. Patient continued throughout treatment to complain of nausea, had vomiting along with pain. Patient was offered Zofran, but declined stated she was allergic, records indicate that she had a bad reaction, however, patient states she is allergic. Pain well controlled after order given for PRN Dilaudid. Goals of HD met, 1.5-liter fluid removal. Patient returned to assigned room, with IV Pronotix continuing to infuse.

## 2021-11-17 LAB — CBC WITH DIFFERENTIAL/PLATELET
Abs Immature Granulocytes: 0.02 10*3/uL (ref 0.00–0.07)
Basophils Absolute: 0 10*3/uL (ref 0.0–0.1)
Basophils Relative: 1 %
Eosinophils Absolute: 1.1 10*3/uL — ABNORMAL HIGH (ref 0.0–0.5)
Eosinophils Relative: 17 %
HCT: 40.7 % (ref 36.0–46.0)
Hemoglobin: 12.8 g/dL (ref 12.0–15.0)
Immature Granulocytes: 0 %
Lymphocytes Relative: 17 %
Lymphs Abs: 1.1 10*3/uL (ref 0.7–4.0)
MCH: 28.6 pg (ref 26.0–34.0)
MCHC: 31.4 g/dL (ref 30.0–36.0)
MCV: 91.1 fL (ref 80.0–100.0)
Monocytes Absolute: 0.6 10*3/uL (ref 0.1–1.0)
Monocytes Relative: 10 %
Neutro Abs: 3.4 10*3/uL (ref 1.7–7.7)
Neutrophils Relative %: 55 %
Platelets: 141 10*3/uL — ABNORMAL LOW (ref 150–400)
RBC: 4.47 MIL/uL (ref 3.87–5.11)
RDW: 16.3 % — ABNORMAL HIGH (ref 11.5–15.5)
WBC: 6.2 10*3/uL (ref 4.0–10.5)
nRBC: 0 % (ref 0.0–0.2)

## 2021-11-17 LAB — BASIC METABOLIC PANEL
Anion gap: 13 (ref 5–15)
BUN: 21 mg/dL (ref 8–23)
CO2: 24 mmol/L (ref 22–32)
Calcium: 7.8 mg/dL — ABNORMAL LOW (ref 8.9–10.3)
Chloride: 94 mmol/L — ABNORMAL LOW (ref 98–111)
Creatinine, Ser: 4.45 mg/dL — ABNORMAL HIGH (ref 0.44–1.00)
GFR, Estimated: 11 mL/min — ABNORMAL LOW (ref >60)
Glucose, Bld: 63 mg/dL — ABNORMAL LOW (ref 70–99)
Potassium: 5.1 mmol/L (ref 3.5–5.1)
Sodium: 131 mmol/L — ABNORMAL LOW (ref 135–145)

## 2021-11-17 LAB — GLUCOSE, CAPILLARY
Glucose-Capillary: 69 mg/dL — ABNORMAL LOW (ref 70–99)
Glucose-Capillary: 69 mg/dL — ABNORMAL LOW (ref 70–99)
Glucose-Capillary: 151 mg/dL — ABNORMAL HIGH (ref 70–99)

## 2021-11-17 MED ORDER — PANTOPRAZOLE SODIUM 40 MG PO TBEC: 40.0000 mg | DELAYED_RELEASE_TABLET | Freq: Two times a day (BID) | ORAL | 0 refills | Status: DC

## 2021-11-17 NOTE — Discharge Summary (Signed)
Physician Discharge Summary   Patient: [name] MRN: [id number] DOB: [birth year]  Admit date:     11/15/2021  Discharge date: 11/17/21  Discharge Physician: Sharen Hones   PCP: Denton Lank, MD   Recommendations at discharge:   Follow-up with PCP in 1 week. Follow-up with GI in 1 month  Discharge Diagnoses: Principal Problem:   Acute GI bleeding Active Problems:   End-stage renal disease on hemodialysis (HCC)   Type 1 diabetes mellitus (HCC)   Obesity (BMI 30-39.9)   Upper GI bleed  Resolved Problems:   * No resolved hospital problems. *   Hospital Course: [name] is a 60 y.o. female with medical history significant of multiple medical problems including end-stage renal disease, COPD, coronary disease with chronic diastolic congestive heart failure, peripheral arterial disease, type 2 diabetes with diabetic peripheral neuropathy and gastroparesis who presents to the hospital complaining of upper abdominal pain. CT abdomen/pelvis with contrast showed a mass structure in the stomach.  NG tube was placed, suction out pink-colored blood.  NG tube was subsequently removed afterwards. Per GI recommendation, patient was started on Protonix drip.  Assessment and Plan:  Abdominal pain. Upper GI bleed. Abdominal pain has resolved after multiple bowel movements last night. Hemoglobin still stable.  Patient evaluated by Dr. Allen Norris, does not appear to have active bleeding.  Condition has improved, will continue PPI twice a day and follow-up with GI as outpatient.   Type 1 diabetes. Well-controlled.  Resume home dose insulin.  End-stage renal disease. Hyponatremia. Patient was dialyzed on 2/25    Coronary artery disease. Chronic diastolic congestive heart failure. Conditions are stable.  COPD  No bronchospasm.          Consultants: Nephrology and GI. Procedures performed: HD  Disposition: Home Diet recommendation:  Discharge Diet Orders (From admission, onward)      Start     Ordered   11/17/21 0000  Diet - low sodium heart healthy        11/17/21 1011           Renal diet  DISCHARGE MEDICATION: Allergies as of 11/17/2021       Reactions   Ace Inhibitors Swelling, Anaphylaxis   Ativan [lorazepam] Other (See Comments)   Reaction:  Hallucinations and headaches   Compazine [prochlorperazine Edisylate] Anaphylaxis, Nausea And Vomiting, Other (See Comments)   Other reaction(s): dystonia from this vs. Reglan, 23 Jul - patient relates that she takes promethazine frequently with no problems   Sumatriptan Succinate Other (See Comments)   Other reaction(s): delirium and hallucinations per [facility] records   Zofran [ondansetron] Nausea And Vomiting   Per pt. she is allergic to zofran or will experience adverse reaction like hallucination    Losartan Nausea Only   Prochlorperazine Other (See Comments)   Reaction:  Unknown . Patient does not remember reaction but she does have vertigo and anxiety along with n and v at times. Could be used to treat any of these   Reglan [metoclopramide] Other (See Comments)   Per patient her Dr. Evelina Bucy her off it    Scopolamine Other (See Comments)   Dizziness, also has vertigo already   Tape Rash   Plastic tape causes rash   Tapentadol Rash   Ultrasound Gel Itching   Patient states the Ultrasound gel caused itching while in skin contact and resolved when wiped away.        Medication List     STOP taking these medications    vitamin E  180 MG (400 UNITS) capsule       TAKE these medications    albuterol (2.5 MG/3ML) 0.083% nebulizer solution Commonly known as: PROVENTIL Take 2.5 mg by nebulization every 4 (four) hours as needed for wheezing or shortness of breath.   albuterol 108 (90 Base) MCG/ACT inhaler Commonly known as: VENTOLIN HFA Inhale 2 puffs into the lungs every 4 (four) hours as needed for wheezing or shortness of breath.   alum & mag hydroxide-simeth 200-200-20 MG/5ML suspension Commonly known  as: MAALOX/MYLANTA Take 15 mLs by mouth every 6 (six) hours as needed for indigestion or heartburn.   amLODipine 10 MG tablet Commonly known as: NORVASC Take 1 tablet (10 mg total) by mouth daily.   ammonium lactate 12 % lotion Commonly known as: LAC-HYDRIN Apply 1 application topically 2 (two) times daily as needed for dry skin.   aspirin EC 81 MG tablet Take 81 mg by mouth daily.   atorvastatin 20 MG tablet Commonly known as: LIPITOR Take 20 mg by mouth every evening.   Biotin 1 MG Caps Take 1 mg by mouth daily.   calcium carbonate 500 MG chewable tablet Commonly known as: TUMS - dosed in mg elemental calcium Chew 2.5 tablets (500 mg of elemental calcium total) by mouth every 6 (six) hours as needed for indigestion.   cyanocobalamin 1000 MCG tablet Take 1,000 mcg by mouth daily.   diphenhydrAMINE 25 MG tablet Commonly known as: BENADRYL Take 25 mg by mouth every 6 (six) hours as needed for itching.   epoetin alfa 10000 UNIT/ML injection Commonly known as: EPOGEN Inject 1 mL (10,000 Units total) into the vein every Monday, Wednesday, and Friday with hemodialysis.   feeding supplement (NEPRO CARB STEADY) Liqd Take 237 mLs by mouth 2 (two) times daily between meals.   fluticasone 50 MCG/ACT nasal spray Commonly known as: FLONASE Place 2 sprays into both nostrils daily.   furosemide 80 MG tablet Commonly known as: LASIX Take 80 mg by mouth daily.   gabapentin 100 MG capsule Commonly known as: NEURONTIN Take 100 mg by mouth at bedtime.   hydrALAZINE 100 MG tablet Commonly known as: APRESOLINE Take 100 mg by mouth 3 (three) times daily.   HYDROcodone-acetaminophen 5-325 MG tablet Commonly known as: NORCO/VICODIN Take 1 tablet by mouth 3 (three) times daily as needed for moderate pain or severe pain.   hydrOXYzine 25 MG tablet Commonly known as: ATARAX Take 25 mg by mouth daily as needed for itching.   insulin lispro 100 UNIT/ML injection Commonly known as:  HUMALOG Inject 2 Units into the skin 2 (two) times daily.   Iron 325 (65 Fe) MG Tabs Take 325 mg by mouth daily.   lidocaine 5 % Commonly known as: LIDODERM Place 1 patch onto the skin daily. (Remove after 12 hours -- leave off for 12 hours before applying new patch)   meclizine 25 MG tablet Commonly known as: ANTIVERT Take 25 mg by mouth every 6 (six) hours as needed for dizziness.   mirtazapine 7.5 MG tablet Commonly known as: REMERON Take 7.5 mg by mouth daily.   multivitamin Tabs tablet Take 1 tablet by mouth daily.   mupirocin ointment 2 % Commonly known as: BACTROBAN Apply 1 application topically daily as needed (leg rash).   nitroGLYCERIN 0.4 MG SL tablet Commonly known as: NITROSTAT Place 0.4 mg under the tongue every 5 (five) minutes as needed for chest pain.   pantoprazole 40 MG tablet Commonly known as: PROTONIX Take 1 tablet (40 mg  total) by mouth 2 (two) times daily. What changed: when to take this   polyethylene glycol 17 g packet Commonly known as: MIRALAX / GLYCOLAX Take 17 g by mouth daily as needed. What changed: reasons to take this   PROBIOTIC PO Take 1 capsule by mouth daily.   promethazine 12.5 MG tablet Commonly known as: PHENERGAN Take 1 tablet (12.5 mg total) by mouth every 6 (six) hours as needed for nausea or vomiting.   sevelamer carbonate 800 MG tablet Commonly known as: RENVELA Take 800 mg by mouth 4 (four) times daily. (Take with meals and snack)   Vitamin D3 25 MCG (1000 UT) Caps Take 1,000 Units by mouth daily.               Discharge Care Instructions  (From admission, onward)           Start     Ordered   11/17/21 0000  Discharge wound care:       Comments: Follow with pcp   11/17/21 1011            Follow-up Information     Denton Lank, MD In 3 days.   Specialty: Family Medicine Contact information: [address] [place] [address] [phone]         Jonathon Bellows, MD Follow  up in 4 week(s).   Specialty: Gastroenterology Contact information: [place] [address] [phone]                 Discharge Exam: Danley Danker Weights   11/15/21 1213 11/16/21 1608 11/16/21 2014  Weight: 73 kg 72.7 kg 74.6 kg   General exam: Appears calm and comfortable  Respiratory system: Clear to auscultation. Respiratory effort normal. Cardiovascular system: S1 & S2 heard, RRR. No JVD, murmurs, rubs, gallops or clicks. No pedal edema. Gastrointestinal system: Abdomen is nondistended, soft and nontender. No organomegaly or masses felt. Normal bowel sounds heard. Central nervous system: Alert and oriented. No focal neurological deficits. Extremities: Symmetric 5 x 5 power. Skin: No rashes, lesions or ulcers Psychiatry: Judgement and insight appear normal. Mood & affect appropriate.    Condition at discharge: good  The results of significant diagnostics from this hospitalization (including imaging, microbiology, ancillary and laboratory) are listed below for reference.   Imaging Studies: DG Chest 2 View  Result Date: 11/15/2021 CLINICAL DATA:  Central chest pain worsened with inspiration, did not start dialysis today EXAM: CHEST - 2 VIEW COMPARISON:  06/04/2021 FINDINGS: RIGHT jugular dual-lumen central venous catheter with tip projecting over inferior RIGHT atrium near cavoatrial junction. Multiple vascular stents LEFT subclavian and LEFT axillary. Enlargement of cardiac silhouette with pulmonary vascular congestion. Diffuse interstitial infiltrates consistent with pulmonary edema, greater at bases. No pleural effusion or pneumothorax. IMPRESSION: Enlargement of cardiac silhouette with pulmonary vascular congestion and diffuse pulmonary edema. Electronically Signed   By: Lavonia Dana M.D.   On: 11/15/2021 12:55   CT ABDOMEN PELVIS W CONTRAST  Result Date: 11/15/2021 CLINICAL DATA:  Abdominal pain EXAM: CT ABDOMEN AND PELVIS WITH CONTRAST  TECHNIQUE: Multidetector CT imaging of the abdomen and pelvis was performed using the standard protocol following bolus administration of intravenous contrast. RADIATION DOSE REDUCTION: This exam was performed according to the departmental dose-optimization program which includes automated exposure control, adjustment of the mA and/or kV according to patient size and/or use of iterative reconstruction technique. CONTRAST:  78mL OMNIPAQUE IOHEXOL 300 MG/ML  SOLN COMPARISON:  CT examination dated April 25, 2021  FINDINGS: Lower chest: Trace bilateral pleural effusions and mild atelectasis. Cardiomegaly. Hepatobiliary: No focal liver abnormality is seen. Status post cholecystectomy. Prominent extrahepatic common duct, likely secondary to prior cholecystectomy. Mild prominence of the central biliary ducts. Pancreas: Mild pancreatic atrophy. No pancreatic ductal dilatation or surrounding inflammatory changes. Spleen: Normal in size without focal abnormality. Adrenals/Urinary Tract: Adrenal glands are unremarkable. Marked atrophy of bilateral kidneys. Stable cyst in the interpolar region of the left kidney. Bladder is unremarkable. Stomach/Bowel: Small hiatal hernia. There is hyperdense material in the stomach, which is concerning for acute gastric gastric hemorrhage. Appendix not clearly visualized. There is diffuse thickening of the descending/sigmoid colonic wall with mucosal enhancement concerning for colitis. Sigmoid colonic diverticuli without evidence of acute diverticulitis. Vascular/Lymphatic: Advanced atherosclerotic calcification of abdominal aorta and branch vessels. No enlarged abdominal or pelvic lymph nodes. Reproductive: Status post hysterectomy. No adnexal masses. Other: Marked generalized anasarca with subcutaneous edema in bilateral extremities and about the abdomen. Musculoskeletal: Degenerate disc disease of the spine with Schmorl nodes in L2 and L3. No acute osseous abnormality. IMPRESSION: 1.  Hyperdense material in the stomach, which is suspicious for acute gastric bleed. Clinical correlation is suggested. 2. Diffuse wall thickening of the descending and sigmoid colon concerning for colitis. 3.  Chronic atrophy of bilateral kidneys. 4. Marked generalized anasarca with subcutaneous edema about the abdominal wall and bilateral lower extremities, likely suggesting volume overload. 5.  Trace bilateral pleural effusions. 6.  Cholecystectomy changes. 7. Marked atherosclerotic disease of abdominal aorta and branch vessels. 8. Sigmoid colonic diverticulosis without evidence of acute diverticulitis. Findings suspicious for acute gastric bleed were reported to Dr. Arcola Jansky at approximately 4:05 p.m. on 11/15/2021. Electronically Signed   By: Keane Police D.O.   On: 11/15/2021 16:10   PERIPHERAL VASCULAR CATHETERIZATION  Result Date: 11/05/2021 See surgical note for result.   Microbiology: Results for orders placed or performed during the hospital encounter of 11/15/21  Resp Panel by RT-PCR (Flu A&B, Covid) Nasopharyngeal Swab     Status: None   Collection Time: 11/15/21  1:38 PM   Specimen: Nasopharyngeal Swab; Nasopharyngeal(NP) swabs in vial transport medium  Result Value Ref Range Status   SARS Coronavirus 2 by RT PCR NEGATIVE NEGATIVE Final    Comment: (NOTE) SARS-CoV-2 target nucleic acids are NOT DETECTED.  The SARS-CoV-2 RNA is generally detectable in upper respiratory specimens during the acute phase of infection. The lowest concentration of SARS-CoV-2 viral copies this assay can detect is 138 copies/mL. A negative result does not preclude SARS-Cov-2 infection and should not be used as the sole basis for treatment or other patient management decisions. A negative result may occur with  improper specimen collection/handling, submission of specimen other than nasopharyngeal swab, presence of viral mutation(s) within the areas targeted by this assay, and inadequate number of  viral copies(<138 copies/mL). A negative result must be combined with clinical observations, patient history, and epidemiological information. The expected result is Negative.  Fact Sheet for Patients:  EntrepreneurPulse.com.au  Fact Sheet for Healthcare Providers:  IncredibleEmployment.be  This test is no t yet approved or cleared by the [place] FDA and  has been authorized for detection and/or diagnosis of SARS-CoV-2 by FDA under an Emergency Use Authorization (EUA). This EUA will remain  in effect (meaning this test can be used) for the duration of the COVID-19 declaration under Section 564(b)(1) of the Act, 21 U.S.C.section 360bbb-3(b)(1), unless the authorization is terminated  or revoked sooner.       Influenza A by  PCR NEGATIVE NEGATIVE Final   Influenza B by PCR NEGATIVE NEGATIVE Final    Comment: (NOTE) The Xpert Xpress SARS-CoV-2/FLU/RSV plus assay is intended as an aid in the diagnosis of influenza from Nasopharyngeal swab specimens and should not be used as a sole basis for treatment. Nasal washings and aspirates are unacceptable for Xpert Xpress SARS-CoV-2/FLU/RSV testing.  Fact Sheet for Patients: EntrepreneurPulse.com.au  Fact Sheet for Healthcare Providers: IncredibleEmployment.be  This test is not yet approved or cleared by the [place] FDA and has been authorized for detection and/or diagnosis of SARS-CoV-2 by FDA under an Emergency Use Authorization (EUA). This EUA will remain in effect (meaning this test can be used) for the duration of the COVID-19 declaration under Section 564(b)(1) of the Act, 21 U.S.C. section 360bbb-3(b)(1), unless the authorization is terminated or revoked.  Performed at [facility], [address] [place], [place] [address]     Labs: CBC: Recent Labs  Lab 11/15/21 1220 11/15/21 1711 11/16/21 8588 11/17/21 0724  WBC 7.3  --   6.8 6.2  NEUTROABS  --   --   --  3.4  HGB 14.3 14.3 13.4 12.8  HCT 46.2* 46.1* 43.0 40.7  MCV 92.2  --  90.1 91.1  PLT 194  --  179 502*   Basic Metabolic Panel: Recent Labs  Lab 11/15/21 1220 11/16/21 0638 11/17/21 0724  NA 130* 129* 131*  K 4.5 5.0 5.1  CL 92* 93* 94*  CO2 26 22 24   GLUCOSE 149* 92 63*  BUN 35* 40* 21  CREATININE 5.61* 6.16* 4.45*  CALCIUM 8.4* 7.8* 7.8*   Liver Function Tests: Recent Labs  Lab 11/15/21 1326  AST 28  ALT 15  ALKPHOS 87  BILITOT 0.5  PROT 8.3*  ALBUMIN 4.0   CBG: Recent Labs  Lab 11/16/21 0749 11/16/21 1150 11/16/21 2217 11/17/21 0805 11/17/21 0840  GLUCAP 85 83 99 69* 69*    Discharge time spent: less than 30 minutes.  Signed: Sharen Hones, MD [facility] 11/17/2021

## 2021-11-17 NOTE — Progress Notes (Signed)
[name] to be D/C'd Home per MD order.  Discussed prescriptions and follow up appointments with the patient. Prescriptions given to patient, medication list explained in detail. Pt verbalized understanding.  Allergies as of 11/17/2021       Reactions   Ace Inhibitors Swelling, Anaphylaxis   Ativan [lorazepam] Other (See Comments)   Reaction:  Hallucinations and headaches   Compazine [prochlorperazine Edisylate] Anaphylaxis, Nausea And Vomiting, Other (See Comments)   Other reaction(s): dystonia from this vs. Reglan, 23 Jul - patient relates that she takes promethazine frequently with no problems   Sumatriptan Succinate Other (See Comments)   Other reaction(s): delirium and hallucinations per [facility] records   Zofran [ondansetron] Nausea And Vomiting   Per pt. she is allergic to zofran or will experience adverse reaction like hallucination    Losartan Nausea Only   Prochlorperazine Other (See Comments)   Reaction:  Unknown . Patient does not remember reaction but she does have vertigo and anxiety along with n and v at times. Could be used to treat any of these   Reglan [metoclopramide] Other (See Comments)   Per patient her Dr. Evelina Bucy her off it    Scopolamine Other (See Comments)   Dizziness, also has vertigo already   Tape Rash   Plastic tape causes rash   Tapentadol Rash   Ultrasound Gel Itching   Patient states the Ultrasound gel caused itching while in skin contact and resolved when wiped away.        Medication List     STOP taking these medications    vitamin E 180 MG (400 UNITS) capsule       TAKE these medications    albuterol (2.5 MG/3ML) 0.083% nebulizer solution Commonly known as: PROVENTIL Take 2.5 mg by nebulization every 4 (four) hours as needed for wheezing or shortness of breath.   albuterol 108 (90 Base) MCG/ACT inhaler Commonly known as: VENTOLIN HFA Inhale 2 puffs into the lungs every 4 (four) hours as needed for wheezing or shortness of breath.    alum & mag hydroxide-simeth 200-200-20 MG/5ML suspension Commonly known as: MAALOX/MYLANTA Take 15 mLs by mouth every 6 (six) hours as needed for indigestion or heartburn.   amLODipine 10 MG tablet Commonly known as: NORVASC Take 1 tablet (10 mg total) by mouth daily.   ammonium lactate 12 % lotion Commonly known as: LAC-HYDRIN Apply 1 application topically 2 (two) times daily as needed for dry skin.   aspirin EC 81 MG tablet Take 81 mg by mouth daily.   atorvastatin 20 MG tablet Commonly known as: LIPITOR Take 20 mg by mouth every evening.   Biotin 1 MG Caps Take 1 mg by mouth daily.   calcium carbonate 500 MG chewable tablet Commonly known as: TUMS - dosed in mg elemental calcium Chew 2.5 tablets (500 mg of elemental calcium total) by mouth every 6 (six) hours as needed for indigestion.   cyanocobalamin 1000 MCG tablet Take 1,000 mcg by mouth daily.   diphenhydrAMINE 25 MG tablet Commonly known as: BENADRYL Take 25 mg by mouth every 6 (six) hours as needed for itching.   epoetin alfa 10000 UNIT/ML injection Commonly known as: EPOGEN Inject 1 mL (10,000 Units total) into the vein every Monday, Wednesday, and Friday with hemodialysis.   feeding supplement (NEPRO CARB STEADY) Liqd Take 237 mLs by mouth 2 (two) times daily between meals.   fluticasone 50 MCG/ACT nasal spray Commonly known as: FLONASE Place 2 sprays into both nostrils daily.  furosemide 80 MG tablet Commonly known as: LASIX Take 80 mg by mouth daily.   gabapentin 100 MG capsule Commonly known as: NEURONTIN Take 100 mg by mouth at bedtime.   hydrALAZINE 100 MG tablet Commonly known as: APRESOLINE Take 100 mg by mouth 3 (three) times daily.   HYDROcodone-acetaminophen 5-325 MG tablet Commonly known as: NORCO/VICODIN Take 1 tablet by mouth 3 (three) times daily as needed for moderate pain or severe pain.   hydrOXYzine 25 MG tablet Commonly known as: ATARAX Take 25 mg by mouth daily as needed  for itching.   insulin lispro 100 UNIT/ML injection Commonly known as: HUMALOG Inject 2 Units into the skin 2 (two) times daily.   Iron 325 (65 Fe) MG Tabs Take 325 mg by mouth daily.   lidocaine 5 % Commonly known as: LIDODERM Place 1 patch onto the skin daily. (Remove after 12 hours -- leave off for 12 hours before applying new patch)   meclizine 25 MG tablet Commonly known as: ANTIVERT Take 25 mg by mouth every 6 (six) hours as needed for dizziness.   mirtazapine 7.5 MG tablet Commonly known as: REMERON Take 7.5 mg by mouth daily.   multivitamin Tabs tablet Take 1 tablet by mouth daily.   mupirocin ointment 2 % Commonly known as: BACTROBAN Apply 1 application topically daily as needed (leg rash).   nitroGLYCERIN 0.4 MG SL tablet Commonly known as: NITROSTAT Place 0.4 mg under the tongue every 5 (five) minutes as needed for chest pain.   pantoprazole 40 MG tablet Commonly known as: PROTONIX Take 1 tablet (40 mg total) by mouth 2 (two) times daily. What changed: when to take this   polyethylene glycol 17 g packet Commonly known as: MIRALAX / GLYCOLAX Take 17 g by mouth daily as needed. What changed: reasons to take this   PROBIOTIC PO Take 1 capsule by mouth daily.   promethazine 12.5 MG tablet Commonly known as: PHENERGAN Take 1 tablet (12.5 mg total) by mouth every 6 (six) hours as needed for nausea or vomiting.   sevelamer carbonate 800 MG tablet Commonly known as: RENVELA Take 800 mg by mouth 4 (four) times daily. (Take with meals and snack)   Vitamin D3 25 MCG (1000 UT) Caps Take 1,000 Units by mouth daily.               Discharge Care Instructions  (From admission, onward)           Start     Ordered   11/17/21 0000  Discharge wound care:       Comments: Follow with pcp   11/17/21 1011            Vitals:   11/17/21 0600 11/17/21 0807  BP: (!) 153/79 (!) 141/47  Pulse:  66  Resp:  18  Temp:  98.5 F (36.9 C)  SpO2:  100%     Skin clean, dry and intact without evidence of skin break down, no evidence of skin tears noted. IV catheter discontinued intact. Site without signs and symptoms of complications. Dressing and pressure applied. Pt denies pain at this time. No complaints noted.  An After Visit Summary was printed and given to the patient. Patient escorted via [place], and D/C home via private auto.  Fuller Mandril, RN

## 2021-11-17 NOTE — Progress Notes (Signed)
[name]  MRN: [id number]  DOB/AGE: 63/28/1962 63 y.o.  Primary Care Physician:Patel, Judson Roch, MD  Admit date: 11/15/2021  Chief Complaint:  Chief Complaint  Patient presents with   Chest Pain    S-Pt presented on  11/15/2021 with  Chief Complaint  Patient presents with   Chest Pain  . Patient is a 61 year old African-American female with a past medical history of ESRD, on hemodialysis on Monday Wednesday Friday schedule, COPD, coronary artery disease, chronic diastolic CHF, peripheral arterial disease, diabetes mellitus with diabetic neuropathy and gastroparesis who came to the ER with chief complaint of chest pain.  Nephrology was consulted for comanagement of dialysis patient Patient's main concern in today visit was that she was ready to go home  Meds   amLODipine  10 mg Oral Daily   artificial tears   Both Eyes Q8H   atorvastatin  20 mg Oral QPM   Chlorhexidine Gluconate Cloth  6 each Topical Q0600   feeding supplement (NEPRO CARB STEADY)  237 mL Oral BID BM   furosemide  80 mg Oral Daily   gabapentin  100 mg Oral QHS   hydrALAZINE  50 mg Oral TID   insulin aspart  0-9 Units Subcutaneous TID WC   insulin detemir  2 Units Subcutaneous Daily   lipase/protease/amylase  24,000 Units Oral TID AC   [START ON 11/19/2021] pantoprazole  40 mg Intravenous Q12H   sevelamer carbonate  800 mg Oral TID WC & HS   sucralfate  1 g Oral QID   cyanocobalamin  1,000 mcg Oral Daily         ZJQ:BHALP from the symptoms mentioned above,there are no other symptoms referable to all systems reviewed.  Physical Exam: Vital signs in last 24 hours: Temp:  [97.9 F (36.6 C)-98.6 F (37 C)] 98.5 F (36.9 C) (02/26 0807) Pulse Rate:  [63-80] 66 (02/26 0807) Resp:  [0-21] 18 (02/26 0807) BP: (128-173)/(47-79) 141/47 (02/26 0807) SpO2:  [96 %-100 %] 100 % (02/26 0807) Weight:  [72.7 kg-74.6 kg] 74.6 kg (02/25 2014) Weight change: -0.329 kg Last BM Date : 11/15/21  Intake/Output from  previous day: 02/25 0701 - 02/26 0700 In: 543.6 [P.O.:240; I.V.:303.6] Out: 1519  No intake/output data recorded.   Physical Exam:  General- pt is awake,alert, oriented to time place and person  Resp- No acute REsp distress, NO Rhonchi  CVS- S1S2 regular in rate and rhythm  GIT- BS+, soft, Non tender , Non distended  EXT- No LE Edema,  No Cyanosis, Chronic venous stasis changes  Access- R-tunneled cath   Lab Results:  CBC  Recent Labs    11/16/21 0638 11/17/21 0724  WBC 6.8 6.2  HGB 13.4 12.8  HCT 43.0 40.7  PLT 179 141*    BMET  Recent Labs    11/16/21 0638 11/17/21 0724  NA 129* 131*  K 5.0 5.1  CL 93* 94*  CO2 22 24  GLUCOSE 92 63*  BUN 40* 21  CREATININE 6.16* 4.45*  CALCIUM 7.8* 7.8*      Most recent Creatinine trend  Lab Results  Component Value Date   CREATININE 4.45 (H) 11/17/2021   CREATININE 6.16 (H) 11/16/2021   CREATININE 5.61 (H) 11/15/2021      MICRO   Recent Results (from the past 240 hour(s))  Resp Panel by RT-PCR (Flu A&B, Covid) Nasopharyngeal Swab     Status: None   Collection Time: 11/15/21  1:38 PM   Specimen: Nasopharyngeal Swab; Nasopharyngeal(NP) swabs in vial transport  medium  Result Value Ref Range Status   SARS Coronavirus 2 by RT PCR NEGATIVE NEGATIVE Final    Comment: (NOTE) SARS-CoV-2 target nucleic acids are NOT DETECTED.  The SARS-CoV-2 RNA is generally detectable in upper respiratory specimens during the acute phase of infection. The lowest concentration of SARS-CoV-2 viral copies this assay can detect is 138 copies/mL. A negative result does not preclude SARS-Cov-2 infection and should not be used as the sole basis for treatment or other patient management decisions. A negative result may occur with  improper specimen collection/handling, submission of specimen other than nasopharyngeal swab, presence of viral mutation(s) within the areas targeted by this assay, and inadequate number of  viral copies(<138 copies/mL). A negative result must be combined with clinical observations, patient history, and epidemiological information. The expected result is Negative.  Fact Sheet for Patients:  EntrepreneurPulse.com.au  Fact Sheet for Healthcare Providers:  IncredibleEmployment.be  This test is no t yet approved or cleared by the [place] FDA and  has been authorized for detection and/or diagnosis of SARS-CoV-2 by FDA under an Emergency Use Authorization (EUA). This EUA will remain  in effect (meaning this test can be used) for the duration of the COVID-19 declaration under Section 564(b)(1) of the Act, 21 U.S.C.section 360bbb-3(b)(1), unless the authorization is terminated  or revoked sooner.       Influenza A by PCR NEGATIVE NEGATIVE Final   Influenza B by PCR NEGATIVE NEGATIVE Final    Comment: (NOTE) The Xpert Xpress SARS-CoV-2/FLU/RSV plus assay is intended as an aid in the diagnosis of influenza from Nasopharyngeal swab specimens and should not be used as a sole basis for treatment. Nasal washings and aspirates are unacceptable for Xpert Xpress SARS-CoV-2/FLU/RSV testing.  Fact Sheet for Patients: EntrepreneurPulse.com.au  Fact Sheet for Healthcare Providers: IncredibleEmployment.be  This test is not yet approved or cleared by the [place] FDA and has been authorized for detection and/or diagnosis of SARS-CoV-2 by FDA under an Emergency Use Authorization (EUA). This EUA will remain in effect (meaning this test can be used) for the duration of the COVID-19 declaration under Section 564(b)(1) of the Act, 21 U.S.C. section 360bbb-3(b)(1), unless the authorization is terminated or revoked.  Performed at [facility], [address], [place], [place] [address]      CT abdomen done yesterday-on November 15, 2021 IMPRESSION: 1. Hyperdense material in the stomach, which  is suspicious for acute gastric bleed. Clinical correlation is suggested.   2. Diffuse wall thickening of the descending and sigmoid colon concerning for colitis.   3.  Chronic atrophy of bilateral kidneys.   4. Marked generalized anasarca with subcutaneous edema about the abdominal wall and bilateral lower extremities, likely suggesting volume overload.   5.  Trace bilateral pleural effusions.   6.  Cholecystectomy changes.   7. Marked atherosclerotic disease of abdominal aorta and branch vessels.   8. Sigmoid colonic diverticulosis without evidence of acute diverticulitis.   Impression:   Patient is a 61 year old African-American female with a past medical history of end-stage renal disease, COPD, coronary artery disease, chronic diastolic CHF, peripheral arterial disease, diabetes mellitus type 2, diabetic neuropathy, diabetic gastroparesis who comes to the hospital with chief complaint of abdominal pain Patient is admitted with Abdominal pain Upper GI bleed Diabetes mellitus End-stage renal disease Coronary artery disease   1)Renal    End-stage renal disease Patient is on hemodialysis Patient is on Monday Wednesday Friday schedule  Patient was last dialyzed yesterday No need for renal placement  therapy today   2)HTN   Blood pressure is stable    3)Anemia of chronic disease  CBC Latest Ref Rng & Units 11/17/2021 11/16/2021 11/15/2021  WBC 4.0 - 10.5 K/uL 6.2 6.8 -  Hemoglobin 12.0 - 15.0 g/dL 12.8 13.4 14.3  Hematocrit 36.0 - 46.0 % 40.7 43.0 46.1(H)  Platelets 150 - 400 K/uL 141(L) 179 -      Patient has a history of anemia of chronic disease with hemoglobin being around 9.6 on September 25, 2021 Patient hemoglobin is currently more than 12 we will hold off on giving patient any Epogen   4) Secondary hyperparathyroidism -CKD Mineral-Bone Disorder    Lab Results  Component Value Date   PTH 357 (H) 10/05/2018   CALCIUM 7.8 (L) 11/17/2021   CAION  1.09 (L) 04/13/2020   PHOS 3.8 05/10/2021    Secondary Hyperparathyroidism present  Phosphorus at goal.   5)Abdominal pain/upper GI bleed primary team is following GI has been consulted   6) Electrolytes   BMP Latest Ref Rng & Units 11/17/2021 11/16/2021 11/15/2021  Glucose 70 - 99 mg/dL 63(L) 92 149(H)  BUN 8 - 23 mg/dL 21 40(H) 35(H)  Creatinine 0.44 - 1.00 mg/dL 4.45(H) 6.16(H) 5.61(H)  Sodium 135 - 145 mmol/L 131(L) 129(L) 130(L)  Potassium 3.5 - 5.1 mmol/L 5.1 5.0 4.5  Chloride 98 - 111 mmol/L 94(L) 93(L) 92(L)  CO2 22 - 32 mmol/L 24 22 26   Calcium 8.9 - 10.3 mg/dL 7.8(L) 7.8(L) 8.4(L)     Sodium Hyponatremia Secondary to ESRD   Potassium Normokalemic    7)Acid base    Co2 at goal     Plan:  Patient was last dialyzed yesterday No need for renal replacement therapy today    [name] s Theador Hawthorne 11/17/2021, 8:44 AM

## 2021-11-17 NOTE — Plan of Care (Signed)
Problem: Education: Goal: Knowledge of General Education information will improve Description: Including pain rating scale, medication(s)/side effects and non-pharmacologic comfort measures Outcome: Completed/Met   Problem: Health Behavior/Discharge Planning: Goal: Ability to manage health-related needs will improve Outcome: Completed/Met   Problem: Clinical Measurements: Goal: Ability to maintain clinical measurements within normal limits will improve Outcome: Completed/Met Goal: Will remain free from infection Outcome: Completed/Met Goal: Diagnostic test results will improve Outcome: Completed/Met Goal: Respiratory complications will improve Outcome: Completed/Met Goal: Cardiovascular complication will be avoided Outcome: Completed/Met   Problem: Activity: Goal: Risk for activity intolerance will decrease Outcome: Completed/Met   Problem: Nutrition: Goal: Adequate nutrition will be maintained Outcome: Completed/Met   Problem: Coping: Goal: Level of anxiety will decrease Outcome: Completed/Met   Problem: Elimination: Goal: Will not experience complications related to bowel motility Outcome: Completed/Met Goal: Will not experience complications related to urinary retention Outcome: Completed/Met   Problem: Pain Managment: Goal: General experience of comfort will improve Outcome: Completed/Met   Problem: Safety: Goal: Ability to remain free from injury will improve Outcome: Completed/Met   Problem: Skin Integrity: Goal: Risk for impaired skin integrity will decrease Outcome: Completed/Met

## 2021-11-27 ENCOUNTER — Emergency Department: Payer: Medicare Other

## 2021-11-27 ENCOUNTER — Inpatient Hospital Stay
Admission: EM | Admit: 2021-11-27 | Discharge: 2021-12-06 | DRG: 177 | Disposition: A | Discharge status: Skilled Nursing Facility | Payer: Medicare Other | Attending: Internal Medicine | Admitting: Internal Medicine

## 2021-11-27 ENCOUNTER — Other Ambulatory Visit: Payer: Self-pay

## 2021-11-27 DIAGNOSIS — M791 Myalgia, unspecified site: Secondary | ICD-10-CM | POA: Diagnosis not present

## 2021-11-27 DIAGNOSIS — Z91048 Other nonmedicinal substance allergy status: Secondary | ICD-10-CM

## 2021-11-27 DIAGNOSIS — U071 COVID-19: Principal | ICD-10-CM | POA: Diagnosis present

## 2021-11-27 DIAGNOSIS — H9193 Unspecified hearing loss, bilateral: Secondary | ICD-10-CM | POA: Diagnosis present

## 2021-11-27 DIAGNOSIS — Z841 Family history of disorders of kidney and ureter: Secondary | ICD-10-CM

## 2021-11-27 DIAGNOSIS — Z85528 Personal history of other malignant neoplasm of kidney: Secondary | ICD-10-CM

## 2021-11-27 DIAGNOSIS — I252 Old myocardial infarction: Secondary | ICD-10-CM

## 2021-11-27 DIAGNOSIS — I3139 Other pericardial effusion (noninflammatory): Secondary | ICD-10-CM | POA: Diagnosis present

## 2021-11-27 DIAGNOSIS — G928 Other toxic encephalopathy: Secondary | ICD-10-CM | POA: Diagnosis present

## 2021-11-27 DIAGNOSIS — Z809 Family history of malignant neoplasm, unspecified: Secondary | ICD-10-CM

## 2021-11-27 DIAGNOSIS — E1140 Type 2 diabetes mellitus with diabetic neuropathy, unspecified: Secondary | ICD-10-CM | POA: Diagnosis present

## 2021-11-27 DIAGNOSIS — J189 Pneumonia, unspecified organism: Principal | ICD-10-CM

## 2021-11-27 DIAGNOSIS — Z6832 Body mass index (BMI) 32.0-32.9, adult: Secondary | ICD-10-CM

## 2021-11-27 DIAGNOSIS — R197 Diarrhea, unspecified: Secondary | ICD-10-CM | POA: Diagnosis present

## 2021-11-27 DIAGNOSIS — K219 Gastro-esophageal reflux disease without esophagitis: Secondary | ICD-10-CM | POA: Diagnosis present

## 2021-11-27 DIAGNOSIS — Z794 Long term (current) use of insulin: Secondary | ICD-10-CM

## 2021-11-27 DIAGNOSIS — J9611 Chronic respiratory failure with hypoxia: Secondary | ICD-10-CM | POA: Diagnosis present

## 2021-11-27 DIAGNOSIS — B9620 Unspecified Escherichia coli [E. coli] as the cause of diseases classified elsewhere: Secondary | ICD-10-CM | POA: Diagnosis present

## 2021-11-27 DIAGNOSIS — Z79899 Other long term (current) drug therapy: Secondary | ICD-10-CM

## 2021-11-27 DIAGNOSIS — Z992 Dependence on renal dialysis: Secondary | ICD-10-CM

## 2021-11-27 DIAGNOSIS — E785 Hyperlipidemia, unspecified: Secondary | ICD-10-CM | POA: Diagnosis present

## 2021-11-27 DIAGNOSIS — E11649 Type 2 diabetes mellitus with hypoglycemia without coma: Secondary | ICD-10-CM

## 2021-11-27 DIAGNOSIS — J1282 Pneumonia due to coronavirus disease 2019: Secondary | ICD-10-CM | POA: Diagnosis not present

## 2021-11-27 DIAGNOSIS — E1151 Type 2 diabetes mellitus with diabetic peripheral angiopathy without gangrene: Secondary | ICD-10-CM | POA: Diagnosis present

## 2021-11-27 DIAGNOSIS — N2581 Secondary hyperparathyroidism of renal origin: Secondary | ICD-10-CM | POA: Diagnosis present

## 2021-11-27 DIAGNOSIS — R059 Cough, unspecified: Secondary | ICD-10-CM

## 2021-11-27 DIAGNOSIS — Z833 Family history of diabetes mellitus: Secondary | ICD-10-CM

## 2021-11-27 DIAGNOSIS — I132 Hypertensive heart and chronic kidney disease with heart failure and with stage 5 chronic kidney disease, or end stage renal disease: Secondary | ICD-10-CM | POA: Diagnosis present

## 2021-11-27 DIAGNOSIS — J159 Unspecified bacterial pneumonia: Secondary | ICD-10-CM | POA: Diagnosis present

## 2021-11-27 DIAGNOSIS — I5032 Chronic diastolic (congestive) heart failure: Secondary | ICD-10-CM | POA: Diagnosis present

## 2021-11-27 DIAGNOSIS — E871 Hypo-osmolality and hyponatremia: Secondary | ICD-10-CM

## 2021-11-27 DIAGNOSIS — Z7982 Long term (current) use of aspirin: Secondary | ICD-10-CM

## 2021-11-27 DIAGNOSIS — J9621 Acute and chronic respiratory failure with hypoxia: Secondary | ICD-10-CM | POA: Diagnosis present

## 2021-11-27 DIAGNOSIS — E119 Type 2 diabetes mellitus without complications: Secondary | ICD-10-CM

## 2021-11-27 DIAGNOSIS — D631 Anemia in chronic kidney disease: Secondary | ICD-10-CM | POA: Diagnosis present

## 2021-11-27 DIAGNOSIS — H9190 Unspecified hearing loss, unspecified ear: Secondary | ICD-10-CM

## 2021-11-27 DIAGNOSIS — E669 Obesity, unspecified: Secondary | ICD-10-CM | POA: Diagnosis present

## 2021-11-27 DIAGNOSIS — K3184 Gastroparesis: Secondary | ICD-10-CM | POA: Diagnosis present

## 2021-11-27 DIAGNOSIS — R4189 Other symptoms and signs involving cognitive functions and awareness: Secondary | ICD-10-CM | POA: Diagnosis present

## 2021-11-27 DIAGNOSIS — Z888 Allergy status to other drugs, medicaments and biological substances status: Secondary | ICD-10-CM

## 2021-11-27 DIAGNOSIS — I2510 Atherosclerotic heart disease of native coronary artery without angina pectoris: Secondary | ICD-10-CM | POA: Diagnosis present

## 2021-11-27 DIAGNOSIS — Z9981 Dependence on supplemental oxygen: Secondary | ICD-10-CM

## 2021-11-27 DIAGNOSIS — N186 End stage renal disease: Secondary | ICD-10-CM | POA: Diagnosis present

## 2021-11-27 DIAGNOSIS — E1122 Type 2 diabetes mellitus with diabetic chronic kidney disease: Secondary | ICD-10-CM | POA: Diagnosis present

## 2021-11-27 DIAGNOSIS — J440 Chronic obstructive pulmonary disease with acute lower respiratory infection: Secondary | ICD-10-CM | POA: Diagnosis present

## 2021-11-27 DIAGNOSIS — R5381 Other malaise: Secondary | ICD-10-CM | POA: Diagnosis present

## 2021-11-27 DIAGNOSIS — I10 Essential (primary) hypertension: Secondary | ICD-10-CM | POA: Diagnosis present

## 2021-11-27 DIAGNOSIS — I081 Rheumatic disorders of both mitral and tricuspid valves: Secondary | ICD-10-CM | POA: Diagnosis present

## 2021-11-27 DIAGNOSIS — Z1624 Resistance to multiple antibiotics: Secondary | ICD-10-CM | POA: Diagnosis present

## 2021-11-27 DIAGNOSIS — E1143 Type 2 diabetes mellitus with diabetic autonomic (poly)neuropathy: Secondary | ICD-10-CM | POA: Diagnosis present

## 2021-11-27 LAB — CBC WITH DIFFERENTIAL/PLATELET
Abs Immature Granulocytes: 0.02 10*3/uL (ref 0.00–0.07)
Basophils Absolute: 0 10*3/uL (ref 0.0–0.1)
Basophils Relative: 0 %
Eosinophils Absolute: 0.6 10*3/uL — ABNORMAL HIGH (ref 0.0–0.5)
Eosinophils Relative: 8 %
HCT: 39.5 % (ref 36.0–46.0)
Hemoglobin: 12.1 g/dL (ref 12.0–15.0)
Immature Granulocytes: 0 %
Lymphocytes Relative: 20 %
Lymphs Abs: 1.5 10*3/uL (ref 0.7–4.0)
MCH: 27.1 pg (ref 26.0–34.0)
MCHC: 30.6 g/dL (ref 30.0–36.0)
MCV: 88.6 fL (ref 80.0–100.0)
Monocytes Absolute: 1.1 10*3/uL — ABNORMAL HIGH (ref 0.1–1.0)
Monocytes Relative: 14 %
Neutro Abs: 4.3 10*3/uL (ref 1.7–7.7)
Neutrophils Relative %: 58 %
Platelets: 319 10*3/uL (ref 150–400)
RBC: 4.46 MIL/uL (ref 3.87–5.11)
RDW: 16.2 % — ABNORMAL HIGH (ref 11.5–15.5)
WBC: 7.5 10*3/uL (ref 4.0–10.5)
nRBC: 0 % (ref 0.0–0.2)

## 2021-11-27 LAB — COMPREHENSIVE METABOLIC PANEL
ALT: 8 U/L (ref 0–44)
AST: 12 U/L — ABNORMAL LOW (ref 15–41)
Albumin: 3.2 g/dL — ABNORMAL LOW (ref 3.5–5.0)
Alkaline Phosphatase: 103 U/L (ref 38–126)
Anion gap: 15 (ref 5–15)
BUN: 33 mg/dL — ABNORMAL HIGH (ref 8–23)
CO2: 25 mmol/L (ref 22–32)
Calcium: 7.9 mg/dL — ABNORMAL LOW (ref 8.9–10.3)
Chloride: 89 mmol/L — ABNORMAL LOW (ref 98–111)
Creatinine, Ser: 6.37 mg/dL — ABNORMAL HIGH (ref 0.44–1.00)
GFR, Estimated: 7 mL/min — ABNORMAL LOW (ref >60)
Glucose, Bld: 99 mg/dL (ref 70–99)
Potassium: 4.7 mmol/L (ref 3.5–5.1)
Sodium: 129 mmol/L — ABNORMAL LOW (ref 135–145)
Total Bilirubin: 0.7 mg/dL (ref 0.3–1.2)
Total Protein: 6.8 g/dL (ref 6.5–8.1)

## 2021-11-27 LAB — MAGNESIUM: Magnesium: 1.9 mg/dL (ref 1.7–2.4)

## 2021-11-27 LAB — HEPATITIS B SURFACE ANTIGEN: Hepatitis B Surface Ag: NONREACTIVE

## 2021-11-27 LAB — HEPATITIS B SURFACE ANTIBODY,QUALITATIVE: Hep B S Ab: REACTIVE — AB

## 2021-11-27 LAB — RESP PANEL BY RT-PCR (FLU A&B, COVID) ARPGX2
Influenza A by PCR: NEGATIVE
Influenza B by PCR: NEGATIVE
SARS Coronavirus 2 by RT PCR: POSITIVE — AB

## 2021-11-27 LAB — EXPECTORATED SPUTUM ASSESSMENT W GRAM STAIN, RFLX TO RESP C

## 2021-11-27 LAB — PROCALCITONIN: Procalcitonin: 0.75 ng/mL

## 2021-11-27 MED ORDER — HEPARIN SODIUM (PORCINE) 5000 UNIT/ML IJ SOLN
5000.0000 [IU] | Freq: Three times a day (TID) | INTRAMUSCULAR | Status: DC
  Administered 2021-11-27 – 2021-12-06 (×27): 5000 [IU] via SUBCUTANEOUS
  Filled 2021-11-27 (×28): qty 1

## 2021-11-27 MED ORDER — HYDROCODONE-ACETAMINOPHEN 5-325 MG PO TABS
1.0000 | ORAL_TABLET | ORAL | Status: DC | PRN
  Administered 2021-11-27: 2 via ORAL
  Administered 2021-11-27: 1 via ORAL
  Administered 2021-11-27 – 2021-11-28 (×2): 2 via ORAL
  Filled 2021-11-27 (×2): qty 2
  Filled 2021-11-27: qty 1
  Filled 2021-11-27: qty 2

## 2021-11-27 MED ORDER — SODIUM CHLORIDE 0.9 % IV SOLN
500.0000 mg | Freq: Once | INTRAVENOUS | Status: AC
Start: 2021-11-27 — End: 2021-11-27
  Administered 2021-11-27: 500 mg via INTRAVENOUS
  Filled 2021-11-27: qty 5

## 2021-11-27 MED ORDER — RENA-VITE PO TABS
1.0000 | ORAL_TABLET | Freq: Every day | ORAL | Status: DC
  Administered 2021-11-27 – 2021-12-06 (×10): 1 via ORAL
  Filled 2021-11-27 (×10): qty 1

## 2021-11-27 MED ORDER — ACETAMINOPHEN 325 MG PO TABS
650.0000 mg | ORAL_TABLET | Freq: Four times a day (QID) | ORAL | Status: DC | PRN
  Administered 2021-11-28: 650 mg via ORAL
  Filled 2021-11-27: qty 2

## 2021-11-27 MED ORDER — ACETAMINOPHEN 500 MG PO TABS
1000.0000 mg | ORAL_TABLET | Freq: Once | ORAL | Status: AC
  Administered 2021-11-27: 1000 mg via ORAL
  Filled 2021-11-27: qty 2

## 2021-11-27 MED ORDER — PROMETHAZINE HCL 25 MG PO TABS
ORAL_TABLET | ORAL | Status: AC
  Administered 2021-11-27: 12.5 mg via ORAL
  Filled 2021-11-27: qty 1

## 2021-11-27 MED ORDER — PENTAFLUOROPROP-TETRAFLUOROETH EX AERO
1.0000 "application " | INHALATION_SPRAY | CUTANEOUS | Status: DC | PRN
  Filled 2021-11-27: qty 30

## 2021-11-27 MED ORDER — SODIUM CHLORIDE 0.9 % IV SOLN
1.0000 g | INTRAVENOUS | Status: AC
  Administered 2021-11-28 – 2021-12-01 (×4): 1 g via INTRAVENOUS
  Filled 2021-11-27: qty 1
  Filled 2021-11-27: qty 10
  Filled 2021-11-27: qty 1
  Filled 2021-11-27: qty 10

## 2021-11-27 MED ORDER — HEPARIN SODIUM (PORCINE) 1000 UNIT/ML IJ SOLN
INTRAMUSCULAR | Status: AC
  Administered 2021-11-27: 4200 [IU] via INTRAVENOUS_CENTRAL
  Filled 2021-11-27: qty 10

## 2021-11-27 MED ORDER — HEPARIN SODIUM (PORCINE) 1000 UNIT/ML DIALYSIS: 1000.0000 [IU] | INTRAMUSCULAR | Status: DC | PRN

## 2021-11-27 MED ORDER — SODIUM CHLORIDE 0.9 % IV SOLN
500.0000 mg | INTRAVENOUS | Status: DC
  Administered 2021-11-28 – 2021-11-29 (×2): 500 mg via INTRAVENOUS
  Filled 2021-11-27: qty 5
  Filled 2021-11-27 (×2): qty 500

## 2021-11-27 MED ORDER — ALBUTEROL SULFATE HFA 108 (90 BASE) MCG/ACT IN AERS
2.0000 | INHALATION_SPRAY | Freq: Four times a day (QID) | RESPIRATORY_TRACT | Status: DC
  Administered 2021-11-27 – 2021-12-06 (×31): 2 via RESPIRATORY_TRACT
  Filled 2021-11-27: qty 6.7

## 2021-11-27 MED ORDER — CHLORHEXIDINE GLUCONATE CLOTH 2 % EX PADS
6.0000 | MEDICATED_PAD | Freq: Every day | CUTANEOUS | Status: DC
  Administered 2021-11-27 – 2021-12-06 (×9): 6 via TOPICAL

## 2021-11-27 MED ORDER — PROMETHAZINE HCL 25 MG PO TABS
12.5000 mg | ORAL_TABLET | Freq: Four times a day (QID) | ORAL | Status: DC | PRN
  Administered 2021-11-28 – 2021-11-29 (×3): 12.5 mg via ORAL
  Filled 2021-11-27 (×6): qty 1

## 2021-11-27 MED ORDER — SODIUM CHLORIDE 0.9 % IV SOLN
1.0000 g | Freq: Once | INTRAVENOUS | Status: AC
  Administered 2021-11-27: 1 g via INTRAVENOUS
  Filled 2021-11-27: qty 10

## 2021-11-27 MED ORDER — HYDROCOD POLI-CHLORPHE POLI ER 10-8 MG/5ML PO SUER
5.0000 mL | Freq: Two times a day (BID) | ORAL | Status: DC | PRN
  Administered 2021-11-27 – 2021-11-28 (×3): 5 mL via ORAL
  Filled 2021-11-27 (×3): qty 5

## 2021-11-27 MED ORDER — BOOST / RESOURCE BREEZE PO LIQD CUSTOM
1.0000 | Freq: Three times a day (TID) | ORAL | Status: DC
  Administered 2021-11-28 – 2021-12-06 (×22): 1 via ORAL

## 2021-11-27 MED ORDER — ALTEPLASE 2 MG IJ SOLR: 2.0000 mg | Freq: Once | INTRAMUSCULAR | Status: DC | PRN

## 2021-11-27 MED ORDER — SODIUM CHLORIDE 0.9 % IV SOLN: 100.0000 mL | INTRAVENOUS | Status: DC | PRN

## 2021-11-27 MED ORDER — LIDOCAINE-PRILOCAINE 2.5-2.5 % EX CREA
1.0000 "application " | TOPICAL_CREAM | CUTANEOUS | Status: DC | PRN
  Filled 2021-11-27: qty 5

## 2021-11-27 MED ORDER — LIDOCAINE HCL (PF) 1 % IJ SOLN
5.0000 mL | INTRAMUSCULAR | Status: DC | PRN
  Filled 2021-11-27: qty 5

## 2021-11-27 MED ORDER — GUAIFENESIN-DM 100-10 MG/5ML PO SYRP
10.0000 mL | ORAL_SOLUTION | ORAL | Status: DC | PRN
  Administered 2021-11-27 – 2021-12-06 (×11): 10 mL via ORAL
  Filled 2021-11-27 (×11): qty 10

## 2021-11-27 NOTE — Progress Notes (Signed)
PROGRESS NOTE   [name]  ZOX:[id number]    DOB: Aug 31, 1961    DOA: 11/27/2021  PCP: Denton Lank, MD   I have briefly reviewed patients previous [facility] in [facility].  Chief Complaint  Patient presents with   Generalized Body Aches    Hospital Course:  61 year old female with medical history significant for ESRD on TTS HD, type II DM, chronic diastolic CHF, CAD, PAD, chronic respiratory failure on home oxygen 2 L/min, COPD, anemia, HLD, GERD, hard of hearing, presented to the ED with 7 to 10 days of cough, chills, weakness and myalgia.  Reported testing positive for COVID 19 at the dialysis center more than a week ago.  Denies dyspnea or chest pain.  Developed self-limiting diarrhea 3 days PTA without nausea, vomiting or abdominal pain.  Admitted for bacterial pneumonia superimposed on recent COVID-19 infection.  Nephrology consulted for HD needs.   Assessment & Plan:  Active Problems:   Bacterial pneumonia   COVID-19 virus infection   Chronic respiratory failure with hypoxia (HCC)   End-stage renal disease on hemodialysis (HCC)   Hyponatremia   HTN (hypertension)   Diabetes mellitus, type II (HCC)   Chronic diastolic CHF (congestive heart failure) (HCC)   Coronary artery disease involving native coronary artery of native heart without angina pectoris   Assessment and Plan: Bacterial pneumonia Based on symptomatology including productive cough and chest x-ray findings. Procalcitonin elevated but unclear significance in the context of ESRD patient. Continue empirically started IV ceftriaxone and azithromycin. Supportive care.  COVID-19 virus infection Per patient report, tested positive for COVID-19 at the outpatient dialysis center more than a week ago. Hypoxia no worse than her baseline and requiring 2 L/min [place] oxygen. Discussed in detail with pharmacy, given duration of her COVID infection, no antivirals initiated.  No indication for steroids in the absence  of worsening hypoxia. Supportive treatment.  Chronic respiratory failure with hypoxia (HCC) 2 L/min O2 requirement at baseline. Appears to be at baseline.  Unclear why she is on 4 L well saturating in the high 90s.  Needs to be titrated down to her home level.  End-stage renal disease on hemodialysis [facility]) Nephrology consulted for HD needs.  Hyponatremia Sodium 129 but at baseline Her baseline and likely related to ESRD and HD. Management per nephrology.  HTN (hypertension) Continue amlodipine, hydralazine Controlled.  Diabetes mellitus, type II ([name]) Sliding scale insulin coverage pending home med verification Had a couple of hypoglycemic episodes early this morning.  Continue to monitor CBGs closely and treat per hypoglycemia protocol.  Chronic diastolic CHF (congestive heart failure) (HCC) Compensated.  Continue furosemide. Volume management across HD.  Coronary artery disease involving native coronary artery of native heart without angina pectoris Continue aspirin and atorvastatin with nitroglycerin as needed chest pain   Body mass index is 30.04 kg/m.     DVT prophylaxis: heparin injection 5,000 Units Start: 11/27/21 0600     Code Status: Full Code:  Family Communication: None at bedside. Disposition:  Status is: Observation The patient remains OBS appropriate and will d/c before 2 midnights.    Consultants:   Nephrology  Procedures:     Antimicrobials:   As above   Subjective:  Seen this morning.  Patient playing some games on her smart phone and unwilling to engage MD minute interview or exam despite trying multiple times.  Eventually looked up from her fine and denied complaints.  No chest pain or dyspnea reported.  Noted wet sounding cough.  Objective:   Vitals:   11/27/21 1638 11/27/21 1641 11/27/21 1655 11/27/21 1700  BP:  (!) 117/56 (!) 131/54 (!) 131/50  Pulse:   63 64  Resp:  15 (!) 25 13  Temp: 98.4 F (36.9 C)     TempSrc: Oral      SpO2:  98% 96% 99%  Weight: 74.5 kg     Height:        General exam: Middle-age female, moderately built and nourished lying comfortably propped up in bed without distress. Respiratory system: Slightly harsh breath sounds bilaterally but no clear crackles, wheezing or rhonchi appreciated.  Respiratory effort normal. Cardiovascular system: S1 & S2 heard, RRR. No JVD, murmurs, rubs, gallops or clicks. No pedal edema.  Telemetry personally reviewed: SB in the 50s-SR in the 60s. Gastrointestinal system: Abdomen is nondistended, soft and nontender. No organomegaly or masses felt. Normal bowel sounds heard. Central nervous system: Alert and oriented. No focal neurological deficits. Extremities: Symmetric 5 x 5 power. Skin: No rashes, lesions or ulcers Psychiatry: Judgement and insight appear normal. Mood & affect appropriate.     Data Reviewed:   I have personally reviewed following labs and imaging studies   CBC: Recent Labs  Lab 11/27/21 0354  WBC 7.5  NEUTROABS 4.3  HGB 12.1  HCT 39.5  MCV 88.6  PLT 008    Basic Metabolic Panel: Recent Labs  Lab 11/27/21 0354  NA 129*  K 4.7  CL 89*  CO2 25  GLUCOSE 99  BUN 33*  CREATININE 6.37*  CALCIUM 7.9*  MG 1.9    Liver Function Tests: Recent Labs  Lab 11/27/21 0354  AST 12*  ALT 8  ALKPHOS 103  BILITOT 0.7  PROT 6.8  ALBUMIN 3.2*    CBG: No results for input(s): GLUCAP in the last 168 hours.  Microbiology Studies:   Recent Results (from the past 240 hour(s))  Resp Panel by RT-PCR (Flu A&B, Covid) Nasopharyngeal Swab     Status: Abnormal   Collection Time: 11/27/21  3:54 AM   Specimen: Nasopharyngeal Swab; Nasopharyngeal(NP) swabs in vial transport medium  Result Value Ref Range Status   SARS Coronavirus 2 by RT PCR POSITIVE (A) NEGATIVE Final    Comment: (NOTE) SARS-CoV-2 target nucleic acids are DETECTED.  The SARS-CoV-2 RNA is generally detectable in upper respiratory specimens during the acute phase  of infection. Positive results are indicative of the presence of the identified virus, but do not rule out bacterial infection or co-infection with other pathogens not detected by the test. Clinical correlation with patient history and other diagnostic information is necessary to determine patient infection status. The expected result is Negative.  Fact Sheet for Patients: EntrepreneurPulse.com.au  Fact Sheet for Healthcare Providers: IncredibleEmployment.be  This test is not yet approved or cleared by the [place] FDA and  has been authorized for detection and/or diagnosis of SARS-CoV-2 by FDA under an Emergency Use Authorization (EUA).  This EUA will remain in effect (meaning this test can be used) for the duration of  the COVID-19 declaration under Section 564(b)(1) of the A ct, 21 U.S.C. section 360bbb-3(b)(1), unless the authorization is terminated or revoked sooner.     Influenza A by PCR NEGATIVE NEGATIVE Final   Influenza B by PCR NEGATIVE NEGATIVE Final    Comment: (NOTE) The Xpert Xpress SARS-CoV-2/FLU/RSV plus assay is intended as an aid in the diagnosis of influenza from Nasopharyngeal swab specimens and should not be used as a sole basis for treatment. Nasal washings  and aspirates are unacceptable for Xpert Xpress SARS-CoV-2/FLU/RSV testing.  Fact Sheet for Patients: EntrepreneurPulse.com.au  Fact Sheet for Healthcare Providers: IncredibleEmployment.be  This test is not yet approved or cleared by the [place] FDA and has been authorized for detection and/or diagnosis of SARS-CoV-2 by FDA under an Emergency Use Authorization (EUA). This EUA will remain in effect (meaning this test can be used) for the duration of the COVID-19 declaration under Section 564(b)(1) of the Act, 21 U.S.C. section 360bbb-3(b)(1), unless the authorization is terminated or revoked.  Performed at [facility], [address] [place], [place] [address]   Blood culture (single)     Status: None (Preliminary result)   Collection Time: 11/27/21  5:04 AM   Specimen: BLOOD  Result Value Ref Range Status   Specimen Description BLOOD LEFT FA  Final   Special Requests   Final    BOTTLES DRAWN AEROBIC AND ANAEROBIC Blood Culture adequate volume   Culture   Final    NO GROWTH <12 HOURS Performed at [facility], [address] [place], [place] [address]    Report Status PENDING  Incomplete  Expectorated Sputum Assessment w Gram Stain, Rflx to Resp Cult     Status: None   Collection Time: 11/27/21  6:57 AM   Specimen: Expectorated Sputum  Result Value Ref Range Status   Specimen Description EXPECTORATED SPUTUM  Final   Special Requests NONE  Final   Sputum evaluation   Final    THIS SPECIMEN IS ACCEPTABLE FOR SPUTUM CULTURE Performed at [facility], [address], [place], [place] [address]    Report Status 11/27/2021 FINAL  Final    Radiology Studies:  DG Chest Portable 1 View  Result Date: 11/27/2021 CLINICAL DATA:  Cough and fatigue.  Question COVID. EXAM: PORTABLE CHEST 1 VIEW COMPARISON:  11/15/2021 FINDINGS: Cardiomegaly. Vascular pedicle widening. Left subclavian vein stenting. Right-sided dialysis catheter with tip at the right atrium. Diffuse congested appearance of vessels with interstitial thickening. Small left pleural effusion and hazy density at the bases similar to prior. IMPRESSION: Vascular congestion/edema and small left pleural effusion that is similar to priors. Superimposed pneumonia could easily be obscured. Electronically Signed   By: Jorje Guild M.D.   On: 11/27/2021 04:26    Scheduled Meds:    albuterol  2 puff Inhalation Q6H   Chlorhexidine Gluconate Cloth  6 each Topical Q0600   feeding supplement  1 Container Oral TID BM   heparin  5,000 Units Subcutaneous Q8H   multivitamin  1 tablet Oral QHS   promethazine         Continuous Infusions:    sodium chloride     sodium chloride     [START ON 11/28/2021] azithromycin (ZITHROMAX) 500 MG IVPB (Vial-Mate [employer])     [START ON 11/28/2021] cefTRIAXone (ROCEPHIN)  IV       LOS: 0 days     Vernell Leep, MD,  FACP, [facility], [facility], [facility] (Care Management Physician Certified) [name]  To contact the attending provider between 7A-7P or the covering provider during after hours 7P-7A, please log into the web site [email or web address] and access using universal [place] password for that web site. If you do not have the password, please call the hospital operator.  11/27/2021, 5:33 PM

## 2021-11-27 NOTE — Hospital Course (Addendum)
61 year old female with medical history significant for ESRD on TTS HD, type II DM, chronic diastolic CHF, CAD, PAD, chronic respiratory failure on home oxygen 2 L/min, COPD, anemia, HLD, GERD, hard of hearing, presented to the ED with 7 to 10 days of cough, chills, weakness and myalgia.  Reported testing positive for COVID 19 at the dialysis center more than a week ago.  Denies dyspnea or chest pain.  Developed self-limiting diarrhea 3 days PTA without nausea, vomiting or abdominal pain.  Admitted for bacterial pneumonia superimposed on recent COVID-19 infection.  Nephrology consulted for HD needs.  On 3/9, patient obtunded, rapid response initiated, improved after a dose of Narcan, opioids discontinued.  Completed 5 days course of IV antibiotics for pneumonia.  Clinically improved.  Medically optimized for DC to SNF pending bed/insurance.  TOC consulted

## 2021-11-27 NOTE — Assessment & Plan Note (Addendum)
Had some hypoglycemic episodes and CBGs monitored closely, no further episodes. Not on insulins.   A1c well controlled at 6.2 on 11/15/2021 DC CBGs

## 2021-11-27 NOTE — ED Triage Notes (Signed)
61 y/o female arrived to the ER by EMS coming from home with a CC of generalized weakness and body aches. Pt is a dialysis patient and last time she went was this past Monday. Pt is normally on 2 LPM [place]. ?

## 2021-11-27 NOTE — Assessment & Plan Note (Addendum)
Nephrology consulted for HD needs. Going for HD today.

## 2021-11-27 NOTE — ED Provider Notes (Signed)
? ?[facility] ?Provider Note ? ? ? Event Date/Time  ? First MD Initiated Contact with Patient 11/27/21 [phone]   ?  (approximate) ? ? ?History  ? ?Generalized Body Aches ? ? ?HPI ? ?SHERRIE MARSAN is a 61 y.o. female who presents to the ED for evaluation of Generalized Body Aches ?  ?I reviewed DC summary from medical admission on 2/26.  History of ESRD on hemodialysis.  CAD, diastolic dysfunction and COPD.  PAD, DM and gastroparesis. ?MWF iHD via right sided permacath. Reports missing one session last Monday (10 days ago), otherwise compliant.  ? ?Pt presents to the ED from home via EMS for evaluation of cough, myalgias and generalized weakness.  Patient reports that she was recently diagnosed with COVID-19, and has become increasingly weak and with diffuse atraumatic myalgias.  Reports 2 days of increasing cough, nearly productive of yellow sputum.  Reports this feels similar to when she has had pneumonia in the past. ? ?Physical Exam  ? ?Triage Vital Signs: ?ED Triage Vitals [11/27/21 0349]  ?Enc Vitals Group  ?   BP 134/67  ?   Pulse Rate 63  ?   Resp 20  ?   Temp 98 ?F (36.7 ?C)  ?   Temp Source Oral  ?   SpO2 92 %  ?   Weight   ?   Height   ?   Head Circumference   ?   Peak Flow   ?   Pain Score   ?   Pain Loc   ?   Pain Edu?   ?   Excl. in [place]?   ? ? ?Most recent vital signs: ?Vitals:  ? 11/27/21 0349  ?BP: 134/67  ?Pulse: 63  ?Resp: 20  ?Temp: 98 ?F (36.7 ?C)  ?SpO2: 92%  ? ? ?General: Awake, no distress.  Appears uncomfortable.  Frequently moaning and coughing. ?CV:  Good peripheral perfusion.  ?Resp:  Normal effort.  Frequently coughing, on home 2 L O2. No wheezing. ?Abd:  No distention.  Soft ?MSK:  No deformity noted.  ?Neuro:  No focal deficits appreciated. ?Other:   ? ? ?ED Results / Procedures / Treatments  ? ?Labs ?(all labs ordered are listed, but only abnormal results are displayed) ?Labs Reviewed  ?COMPREHENSIVE METABOLIC PANEL - Abnormal; Notable for the following components:  ?     Result Value  ? Sodium 129 (*)   ? Chloride 89 (*)   ? BUN 33 (*)   ? Creatinine, Ser 6.37 (*)   ? Calcium 7.9 (*)   ? Albumin 3.2 (*)   ? AST 12 (*)   ? GFR, Estimated 7 (*)   ? All other components within normal limits  ?CBC WITH DIFFERENTIAL/PLATELET - Abnormal; Notable for the following components:  ? RDW 16.2 (*)   ? Monocytes Absolute 1.1 (*)   ? Eosinophils Absolute 0.6 (*)   ? All other components within normal limits  ?RESP PANEL BY RT-PCR (FLU A&B, COVID) ARPGX2  ?CULTURE, BLOOD (SINGLE)  ?MAGNESIUM  ?PROCALCITONIN  ? ? ?EKG ?Sinus rhythm, rate of 62 bpm.  Normal axis and intervals.  No ischemic features. ? ?RADIOLOGY ?1 view CXR reviewed by me with pulmonary vascular congestion and small left infiltrate versus effusion. ? ?Official radiology report(s): ?DG Chest Portable 1 View ? ?Result Date: 11/27/2021 ?CLINICAL DATA:  Cough and fatigue.  Question COVID. EXAM: PORTABLE CHEST 1 VIEW COMPARISON:  11/15/2021 FINDINGS: Cardiomegaly. Vascular pedicle widening. Left subclavian vein  stenting. Right-sided dialysis catheter with tip at the right atrium. Diffuse congested appearance of vessels with interstitial thickening. Small left pleural effusion and hazy density at the bases similar to prior. IMPRESSION: Vascular congestion/edema and small left pleural effusion that is similar to priors. Superimposed pneumonia could easily be obscured. Electronically Signed   By: Jorje Guild M.D.   On: 11/27/2021 04:26   ? ?PROCEDURES and INTERVENTIONS: ? ?.1-3 Lead EKG Interpretation ?Performed by: Vladimir Crofts, MD ?Authorized by: Vladimir Crofts, MD  ? ?  Interpretation: normal   ?  ECG rate:  60 ?  ECG rate assessment: normal   ?  Rhythm: sinus rhythm   ?  Ectopy: none   ?  Conduction: normal   ? ?Medications  ?cefTRIAXone (ROCEPHIN) 1 g in sodium chloride 0.9 % 100 mL IVPB (has no administration in time range)  ?azithromycin (ZITHROMAX) 500 mg in sodium chloride 0.9 % 250 mL IVPB (has no administration in time range)   ?acetaminophen (TYLENOL) tablet 1,000 mg (1,000 mg Oral Given 11/27/21 0359)  ? ? ? ?IMPRESSION / MDM / ASSESSMENT AND PLAN / ED COURSE  ?I reviewed the triage vital signs and the nursing notes. ? ?61 year old dialysis patient presents to the ED with 2 days of myalgias, productive cough and chills concerning for CAP and requiring medical observation admission.  She has normal vital signs on her home 2 L O2.  She looks uncomfortable and unwell, frequently coughing.  Does not seem grossly volume overloaded.  Blood work with chronic hyponatremia and stigmata of ESRD.  No leukocytosis.  CXR with questionable infiltrate versus effusion.  Her clinical syndrome is most consistent with CAP.  We will draw a blood culture and provide antibiotics.  Due to her significant symptoms, her comorbidities and the fact that she lives at home by herself, I think an observation admission is prudent.  I consulted with hospitalist who agrees to admit. ? ?Clinical Course as of 11/27/21 0448  ?Wed Nov 27, 2021  ?0438 Reassessed. [DS]  ?  ?Clinical Course User Index ?[DS] Vladimir Crofts, MD  ? ? ? ?FINAL CLINICAL IMPRESSION(S) / ED DIAGNOSES  ? ?Final diagnoses:  ?Community acquired pneumonia of left lower lobe of lung  ?Myalgia  ? ? ? ?Rx / DC Orders  ? ?ED Discharge Orders   ? ? None  ? ?  ? ? ? ?Note:  This document was prepared using Dragon voice recognition software and may include unintentional dictation errors. ?  ?Vladimir Crofts, MD ?11/27/21 0451 ? ?

## 2021-11-27 NOTE — Assessment & Plan Note (Addendum)
2 L/min O2 requirement at baseline. Appears to be at baseline.  Unclear why she is on 4 L well saturating in the high 90s.  Needs to be titrated down to her home level. ABG this afternoon during unresponsive episode, pH 7.35, PCO2 52, PO2 108 and oxygen saturation 98.9

## 2021-11-27 NOTE — Assessment & Plan Note (Addendum)
Based on symptomatology including productive cough and chest x-ray findings. Procalcitonin elevated but unclear significance in the context of ESRD patient. Completed total 5 days of IV antibiotics including ceftriaxone and azithromycin Blood cultures negative to date.  Sputum culture showed E. coli, MDR except to gentamicin, imipenem and Bactrim As per communication with pharmacy, patient has clinically improved despite current antibiotics not covering for E. coli and hence completed ceftriaxone.  Option for treating E. coli would be Bactrim, not appropriate in dialysis patient Ongoing supportive care and aggressive pulmonary toilet.  Strict aspiration precautions. Ongoing bothersome cough.  Discussed with patient and daughter-in-law at bedside that sometimes this can take longer to resolve.  Will review chest x-ray but that can also lag behind clinical improvement.

## 2021-11-27 NOTE — Progress Notes (Signed)
?   11/27/21 1600  ?Clinical Encounter Type  ?Visited With Health care provider  ?Visit Type Follow-up  ?Consult/Referral To Other (Comment) ?(Advance Directive Paperwork given to nurse for patient)  ? ? ?

## 2021-11-27 NOTE — Assessment & Plan Note (Addendum)
Sodium 129 but at baseline Her baseline and likely related to ESRD and HD. Management per nephrology.

## 2021-11-27 NOTE — Progress Notes (Signed)
?   11/27/21 1546  ?Clinical Encounter Type  ?Visited With Health care provider  ?Visit Type Initial  ?Consult/Referral To Other (Comment) ?([employer] consult for Advance Directive)  ? ?Chaplain responded to [facility] consult for advance directive. Upon inquiry with unit secretary it was determined that now was not a good time due to [place]. ?

## 2021-11-27 NOTE — H&P (Signed)
History and Physical    Patient: [name] IOX:[id number] DOB: 1961-07-25 DOA: 11/27/2021 DOS: the patient was seen and examined on 11/27/2021 PCP: Denton Lank, MD  Patient coming from: Home  Chief Complaint:  Chief Complaint  Patient presents with   Generalized Body Aches    HPI: [name] is a 61 y.o. female with medical history significant for ESRD on HD TTS, DM, chronic diastolic heart failure, CAD, PVD, Chronic respiratory failure on home O2 at 2 L, who presents to the ED with a 7-10 day history of cough, chills, weakness, myalgia.  Reports recently testing positive for COVID.  Denies shortness of breath or chest pain.  Developed diarrhea 3 days ago.  Has no nausea, vomiting, or abdominal pain . ED course: Vitals within normal limits.  CBC WNL, CMP mostly unremarkable.  Sodium is at 129 but this is her baseline for chronic hyponatremia.  Procalcitonin pending.  COVID-positive, flu negative , EKG, personally reviewed and interpreted: Sinus at 62 with no acute ST-T wave changes.  Chest x-ray as follows: IMPRESSION: Vascular congestion/edema and small left pleural effusion that is similar to priors. Superimposed pneumonia could easily be obscured   Patient was started on Rocephin and azithromycin pending procalcitonin.  Hospitalist consulted for admission.   Review of Systems: As mentioned in the history of present illness. All other systems reviewed and are negative. Past Medical History:  Diagnosis Date   Anemia    Anginal pain (HCC)    Anxiety    Arthritis    Asthma    Broken wrist    Bronchitis    chronic diastolic CHF 2/68/3419   Chronic kidney disease    COPD (chronic obstructive pulmonary disease) ([place])    Coronary artery disease    a. cath 2013: stenting to RCA (report not available); b. cath 2014: LM nl, pLAD 40%, mLAD nl, ost LCx 40%, mid LCx nl, pRCA 30% @ site of prior stent, mRCA 50%   Depression    Diabetes mellitus ()    Diabetes mellitus without  complication ([place])    Diabetic neuropathy ([place])    dialysis 2006   Diverticulosis    Dizziness    Dyspnea    Elevated lipids    Environmental and seasonal allergies    ESRD (end stage renal disease) on dialysis ([place])    M-W-F   Gastroparesis    GERD (gastroesophageal reflux disease)    Headache    History of anemia due to chronic kidney disease    History of hiatal hernia    HOH (hard of hearing)    Hx of pancreatitis 2015   Hypertension    Lower extremity edema    Mitral regurgitation    a. echo 10/2013: EF 62%, noWMA, mildly dilated LA, mild to mod MR/TR, GR1DD   Myocardial infarction ([place])    Orthopnea    Parathyroid abnormality ([place])    Peripheral arterial disease ([place])    Pneumonia    Renal cancer ([place])    Renal insufficiency    Pt is on dialysis on M,W + F.   Wheezing    Past Surgical History:  Procedure Laterality Date   A/V SHUNTOGRAM Left 01/20/2018   Procedure: A/V SHUNTOGRAM;  Surgeon: Algernon Huxley, MD;  Location: [place] [facility];  Service: Cardiovascular;  Laterality: Left;   ABDOMINAL HYSTERECTOMY  1992   AMPUTATION TOE Left 10/02/2017   Procedure: AMPUTATION TOE-LEFT GREAT TOE;  Surgeon: Albertine Patricia, DPM;  Location: [facility];  Service:  Podiatry;  Laterality: Left;   APPENDECTOMY     APPLICATION OF WOUND VAC N/A 11/25/2019   Procedure: APPLICATION OF WOUND VAC;  Surgeon: Katha Cabal, MD;  Location: [facility];  Service: Vascular;  Laterality: N/A;   ARTERY BIOPSY Right 10/11/2018   Procedure: BIOPSY TEMPORAL ARTERY;  Surgeon: Vickie Epley, MD;  Location: [facility];  Service: General;  Laterality: Right;   CARDIAC CATHETERIZATION Left 07/26/2015   Procedure: Left Heart Cath and Coronary Angiography;  Surgeon: Dionisio David, MD;  Location: [place] [facility];  Service: Cardiovascular;  Laterality: Left;   CATARACT EXTRACTION W/ INTRAOCULAR LENS IMPLANT Right    CATARACT EXTRACTION W/PHACO Left 03/10/2017   Procedure: CATARACT EXTRACTION PHACO AND  INTRAOCULAR LENS PLACEMENT ([place]);  Surgeon: Birder Robson, MD;  Location: [facility];  Service: Ophthalmology;  Laterality: Left;  [place] 00:51.9 AP% 14.2 CDE 7.39 fluid pack lot # [id number]   CENTRAL LINE INSERTION Right 11/11/2019   Procedure: CENTRAL LINE INSERTION;  Surgeon: Katha Cabal, MD;  Location: [facility];  Service: Vascular;  Laterality: Right;   CENTRAL LINE INSERTION  11/25/2019   Procedure: CENTRAL LINE INSERTION;  Surgeon: Katha Cabal, MD;  Location: [facility];  Service: Vascular;;   CHOLECYSTECTOMY     COLONOSCOPY WITH PROPOFOL N/A 08/12/2016   Procedure: COLONOSCOPY WITH PROPOFOL;  Surgeon: Lollie Sails, MD;  Location[facility];  Service: Endoscopy;  Laterality: N/A;   DIALYSIS FISTULA CREATION Left    upper arm   dialysis grafts     DIALYSIS/PERMA CATHETER INSERTION N/A 11/14/2019   Procedure: DIALYSIS/PERMA CATHETER INSERTION;  Surgeon: Algernon Huxley, MD;  Location: [place] [facility];  Service: Cardiovascular;  Laterality: N/A;   DIALYSIS/PERMA CATHETER INSERTION N/A 02/03/2020   Procedure: DIALYSIS/PERMA CATHETER INSERTION;  Surgeon: Katha Cabal, MD;  Location: [place] [facility];  Service: Cardiovascular;  Laterality: N/A;   DIALYSIS/PERMA CATHETER INSERTION N/A 06/19/2020   Procedure: DIALYSIS/PERMA CATHETER INSERTION;  Surgeon: Katha Cabal, MD;  Location: [place] [facility];  Service: Cardiovascular;  Laterality: N/A;   DIALYSIS/PERMA CATHETER INSERTION N/A 05/07/2021   Procedure: DIALYSIS/PERMA CATHETER INSERTION;  Surgeon: Katha Cabal, MD;  Location: [place] [facility];  Service: Cardiovascular;  Laterality: N/A;  Patient needs general anethesia   DIALYSIS/PERMA CATHETER REMOVAL N/A 05/25/2020   Procedure: DIALYSIS/PERMA CATHETER REMOVAL;  Surgeon: Katha Cabal, MD;  Location: [place] [facility];  Service: Cardiovascular;  Laterality: N/A;   ESOPHAGOGASTRODUODENOSCOPY N/A 03/08/2015   Procedure:  ESOPHAGOGASTRODUODENOSCOPY (EGD);  Surgeon: Manya Silvas, MD;  Location: [facility] END[facility]COPY;  Service: Endoscopy;  Laterality: N/A;   ESOPHAGOGASTRODUODENOSCOPY (EGD) WITH PROPOFOL N/A 03/18/2016   Procedure: ESOPHAGOGASTRODUODENOSCOPY (EGD) WITH PROPOFOL;  Surgeon: [name], MD;  Location: [facility];  Service: Endoscopy;  Laterality: N/A;   EYE SURGERY Right 2018   FECAL TRANSPLANT N/A 08/23/2015   Procedure: FECAL TRANSPLANT;  Surgeon: Manya Silvas, MD;  Location: [facility] ENDOSCOPY;  Service: Endoscopy;  Laterality: N/A;   HAND SURGERY Bilateral    HEMATOMA EVACUATION Left 11/25/2019   Procedure: EVACUATION HEMATOMA;  Surgeon: Katha Cabal, MD;  Location: [facility];  Service: Vascular;  Laterality: Left;   I & D EXTREMITY Left 11/25/2019   Procedure: IRRIGATION AND DEBRIDEMENT EXTREMITY;  Surgeon: Katha Cabal, MD;  Location: [facility];  Service: Vascular;  Laterality: Left;   IR FLUORO GUIDE CV LINE RIGHT  04/06/2020   IR INJECT/THERA/INC NEEDLE/CATH/PLC EPI/CERV/THOR W/IMG  08/13/2020   IR RADIOLOGIST  EVAL & MGMT  07/28/2019   IR RADIOLOGIST EVAL & MGMT  08/11/2019   LIGATION OF ARTERIOVENOUS  FISTULA Left 11/11/2019   LIGATION OF ARTERIOVENOUS  FISTULA Left 11/11/2019   Procedure: LIGATION OF ARTERIOVENOUS  FISTULA;  Surgeon: Katha Cabal, MD;  Location: [facility];  Service: Vascular;  Laterality: Left;   LIGATIONS OF HERO GRAFT Right 06/13/2020   Procedure: LIGATION / REMOVAL OF RIGHT HERO GRAFT;  Surgeon: Katha Cabal, MD;  Location: [facility];  Service: Vascular;  Laterality: Right;   PERIPHERAL VASCULAR CATHETERIZATION N/A 12/20/2015   Procedure: Thrombectomy of dialysis access versus permcath placement;  Surgeon: Algernon Huxley, MD;  Location: [place] [facility];  Service: Cardiovascular;  Laterality: N/A;   PERIPHERAL VASCULAR CATHETERIZATION N/A 12/20/2015   Procedure: A/V Shunt Intervention;  Surgeon: Algernon Huxley, MD;  Location: [place] [facility];  Service:  Cardiovascular;  Laterality: N/A;   PERIPHERAL VASCULAR CATHETERIZATION N/A 12/20/2015   Procedure: A/V Shuntogram/Fistulagram;  Surgeon: Algernon Huxley, MD;  Location: [place] [facility];  Service: Cardiovascular;  Laterality: N/A;   PERIPHERAL VASCULAR CATHETERIZATION N/A 01/02/2016   Procedure: A/V Shuntogram/Fistulagram;  Surgeon: Algernon Huxley, MD;  Location: [place] [facility];  Service: Cardiovascular;  Laterality: N/A;   PERIPHERAL VASCULAR CATHETERIZATION N/A 01/02/2016   Procedure: A/V Shunt Intervention;  Surgeon: Algernon Huxley, MD;  Location: [place] [facility];  Service: Cardiovascular;  Laterality: N/A;   TEE WITHOUT CARDIOVERSION N/A 06/11/2020   Procedure: TRANSESOPHAGEAL ECHOCARDIOGRAM (TEE);  Surgeon: Teodoro Spray, MD;  Location: [facility];  Service: Cardiovascular;  Laterality: N/A;   UPPER EXTREMITY ANGIOGRAPHY Bilateral 11/27/2020   Procedure: UPPER EXTREMITY ANGIOGRAPHY;  Surgeon: Katha Cabal, MD;  Location: [place] [facility];  Service: Cardiovascular;  Laterality: Bilateral;   UPPER EXTREMITY VENOGRAPHY Right 01/18/2020   Procedure: UPPER EXTREMITY VENOGRAPHY;  Surgeon: Katha Cabal, MD;  Location: [place] [facility];  Service: Cardiovascular;  Laterality: Right;   UPPER EXTREMITY VENOGRAPHY Bilateral 11/27/2020   Procedure: UPPER EXTREMITY VENOGRAPHY;  Surgeon: Katha Cabal, MD;  Location: [place] [facility];  Service: Cardiovascular;  Laterality: Bilateral;   UPPER EXTREMITY VENOGRAPHY Left 11/05/2021   Procedure: UPPER EXTREMITY VENOGRAPHY;  Surgeon: Katha Cabal, MD;  Location: [place] [facility];  Service: Cardiovascular;  Laterality: Left;   VASCULAR ACCESS DEVICE INSERTION Right 04/13/2020   Procedure: INSERTION OF HERO VASCULAR ACCESS DEVICE (GRAFT);  Surgeon: Katha Cabal, MD;  Location: [facility];  Service: Vascular;  Laterality: Right;   Social History:  reports that she has never smoked. She has never used smokeless tobacco. She  reports that she does not currently use alcohol. She reports current drug use. Drug: Marijuana.  Allergies  Allergen Reactions   Ace Inhibitors Swelling and Anaphylaxis   Ativan [Lorazepam] Other (See Comments)    Reaction:  Hallucinations and headaches   Compazine [Prochlorperazine Edisylate] Anaphylaxis, Nausea And Vomiting and Other (See Comments)    Other reaction(s): dystonia from this vs. Reglan, 23 Jul - patient relates that she takes promethazine frequently with no problems   Sumatriptan Succinate Other (See Comments)    Other reaction(s): delirium and hallucinations per [facility] records   Zofran [Ondansetron] Nausea And Vomiting    Per pt. she is allergic to zofran or will experience adverse reaction like hallucination    Losartan Nausea Only   Prochlorperazine Other (See Comments)    Reaction:  Unknown . Patient does not remember reaction but she  does have vertigo and anxiety along with n and v at times. Could be used to treat any of these    Reglan [Metoclopramide] Other (See Comments)    Per patient her Dr. Evelina Bucy her off it    Scopolamine Other (See Comments)    Dizziness, also has vertigo already   Tape Rash    Plastic tape causes rash   Tapentadol Rash   Ultrasound Gel Itching    Patient states the Ultrasound gel caused itching while in skin contact and resolved when wiped away.    Family History  Problem Relation Age of Onset   Kidney disease Mother    Diabetes Mother    Cancer Father    Kidney disease Sister     Prior to Admission medications   Medication Sig Start Date End Date Taking? Authorizing Provider  albuterol (PROVENTIL) (2.5 MG/3ML) 0.083% nebulizer solution Take 2.5 mg by nebulization every 4 (four) hours as needed for wheezing or shortness of breath.    [provider]  albuterol (VENTOLIN HFA) 108 (90 Base) MCG/ACT inhaler Inhale 2 puffs into the lungs every 4 (four) hours as needed for wheezing or shortness of breath. 11/09/19   [provider]  alum & mag hydroxide-simeth (MAALOX/MYLANTA) 200-200-20 MG/5ML suspension Take 15 mLs by mouth every 6 (six) hours as needed for indigestion or heartburn. 06/25/20   Enzo Bi, MD  amLODipine (NORVASC) 10 MG tablet Take 1 tablet (10 mg total) by mouth daily. 06/25/20   Enzo Bi, MD  ammonium lactate (LAC-HYDRIN) 12 % lotion Apply 1 application topically 2 (two) times daily as needed for dry skin.     [provider]  aspirin EC 81 MG tablet Take 81 mg by mouth daily.    [provider]  atorvastatin (LIPITOR) 20 MG tablet Take 20 mg by mouth every evening.  10/24/19   [provider]  Biotin 1 MG CAPS Take 1 mg by mouth daily.    [provider]  calcium carbonate (TUMS - DOSED IN MG ELEMENTAL CALCIUM) 500 MG chewable tablet Chew 2.5 tablets (500 mg of elemental calcium total) by mouth every 6 (six) hours as needed for indigestion. 06/25/20   Enzo Bi, MD  Cholecalciferol (VITAMIN D3) 25 MCG (1000 UT) CAPS Take 1,000 Units by mouth daily.     [provider]  cyanocobalamin 1000 MCG tablet Take 1,000 mcg by mouth daily.     [provider]  diphenhydrAMINE (BENADRYL) 25 MG tablet Take 25 mg by mouth every 6 (six) hours as needed for itching.    [provider]  epoetin alfa (EPOGEN) 10000 UNIT/ML injection Inject 1 mL (10,000 Units total) into the vein every Monday, Wednesday, and Friday with hemodialysis. 06/27/20   Enzo Bi, MD  Ferrous Sulfate (IRON) 325 (65 Fe) MG TABS Take 325 mg by mouth daily.     [provider]  fluticasone (FLONASE) 50 MCG/ACT nasal spray Place 2 sprays into both nostrils daily. 10/20/21   [provider]  furosemide (LASIX) 80 MG tablet Take 80 mg by mouth daily. 11/03/21   [provider]  gabapentin (NEURONTIN) 100 MG capsule Take 100 mg by mouth at bedtime.    [provider]  hydrALAZINE (APRESOLINE) 100 MG tablet Take 100 mg by mouth 3 (three) times daily.  11/06/21   [provider]  HYDROcodone-acetaminophen (NORCO/VICODIN) 5-325 MG tablet Take 1 tablet by mouth 3 (three) times daily as needed for moderate pain or severe pain. 05/10/21  Wouk, Ailene Rud, MD  hydrOXYzine (ATARAX/VISTARIL) 25 MG tablet Take 25 mg by mouth daily as needed for itching.    [provider]  insulin lispro (HUMALOG) 100 UNIT/ML injection Inject 2 Units into the skin 2 (two) times daily.    [provider]  lidocaine (LIDODERM) 5 % Place 1 patch onto the skin daily. (Remove after 12 hours -- leave off for 12 hours before applying new patch)    [provider]  meclizine (ANTIVERT) 25 MG tablet Take 25 mg by mouth every 6 (six) hours as needed for dizziness.    [provider]  mirtazapine (REMERON) 7.5 MG tablet Take 7.5 mg by mouth daily. 04/15/21   [provider]  multivitamin (RENA-VIT) TABS tablet Take 1 tablet by mouth daily.     [provider]  mupirocin ointment (BACTROBAN) 2 % Apply 1 application topically daily as needed (leg rash).  11/09/19   [provider]  nitroGLYCERIN (NITROSTAT) 0.4 MG SL tablet Place 0.4 mg under the tongue every 5 (five) minutes as needed for chest pain.     [provider]  Nutritional Supplements (FEEDING SUPPLEMENT, NEPRO CARB STEADY,) LIQD Take 237 mLs by mouth 2 (two) times daily between meals. 12/17/20   Lorella Nimrod, MD  pantoprazole (PROTONIX) 40 MG tablet Take 1 tablet (40 mg total) by mouth 2 (two) times daily. 11/17/21 12/17/21  Sharen Hones, MD  polyethylene glycol (MIRALAX / GLYCOLAX) 17 g packet Take 17 g by mouth daily as needed. Patient taking differently: Take 17 g by mouth daily as needed for mild constipation or moderate constipation. 04/02/21   Nolberto Hanlon, MD  Probiotic Product (PROBIOTIC PO) Take 1 capsule by mouth daily.    [provider]  promethazine (PHENERGAN) 12.5 MG tablet Take 1 tablet (12.5 mg total) by mouth every 6 (six)  hours as needed for nausea or vomiting. 05/10/21   Wouk, Ailene Rud, MD  sevelamer carbonate (RENVELA) 800 MG tablet Take 800 mg by mouth 4 (four) times daily. (Take with meals and snack)    [provider]    Physical Exam: Vitals:   11/27/21 0349  BP: 134/67  Pulse: 63  Resp: 20  Temp: 98 F (36.7 C)  TempSrc: Oral  SpO2: 92%   Physical Exam Vitals and nursing note reviewed.  Constitutional:      General: She is not in acute distress.    Appearance: Normal appearance. She is ill-appearing.     Comments: Coughing up white sputum  HENT:     Head: Normocephalic and atraumatic.  Cardiovascular:     Rate and Rhythm: Normal rate and regular rhythm.     Pulses: Normal pulses.     Heart sounds: Normal heart sounds. No murmur heard. Pulmonary:     Effort: Pulmonary effort is normal.     Breath sounds: Normal breath sounds. No wheezing or rhonchi.     Comments: Coarse breath sounds Abdominal:     General: Bowel sounds are normal.     Palpations: Abdomen is soft.     Tenderness: There is no abdominal tenderness.  Musculoskeletal:        General: No swelling or tenderness. Normal range of motion.     Cervical back: Normal range of motion and neck supple.  Skin:    General: Skin is warm and dry.  Neurological:     General: No focal deficit present.     Mental Status: Mental status is at baseline.  Psychiatric:  Mood and Affect: Mood normal.        Behavior: Behavior normal.     Data Reviewed: Relevant notes from primary care and specialist visits, past discharge summaries as available in EHR, including Care Everywhere. Prior diagnostic testing as pertinent to current admission diagnoses Updated medications and problem lists for reconciliation ED course, including vitals, labs, imaging, treatment and response to treatment Triage notes, nursing and pharmacy notes and ED provider's notes Notable results as noted in HPI   Assessment and Plan: * Pneumonia  due to COVID-19 virus Possible superimposed bacterial infection Antitussives, albuterol via inhaler as needed, oxygen if needed Airborne precautions Received Rocephin and azithromycin in the ED.  Will hold off on further antibiotics for now pending procalcitonin  Hyponatremia Sodium 129 but at baseline  HTN (hypertension) Continue amlodipine, hydralazine  Diabetes mellitus, type II (HCC) Sliding scale insulin coverage pending home med verification  Chronic respiratory failure with hypoxia ([place]) O2 requirement at baseline.  Chronic diastolic CHF (congestive heart failure) (HCC) Compensated.  Continue furosemide.  Coronary artery disease involving native coronary artery of native heart without angina pectoris Continue aspirin and atorvastatin with nitroglycerin as needed chest pain  End-stage renal disease on hemodialysis [facility]) Nephrology consult for continuation of dialysis       Advance Care Planning:   Code Status: Prior   Consults: Nephrology Dr. Candiss Norse  Family Communication: none  Severity of Illness: The appropriate patient status for this patient is OBSERVATION. Observation status is judged to be reasonable and necessary in order to provide the required intensity of service to ensure the patient's safety. The patient's presenting symptoms, physical exam findings, and initial radiographic and laboratory data in the context of their medical condition is felt to place them at decreased risk for further clinical deterioration. Furthermore, it is anticipated that the patient will be medically stable for discharge from the hospital within 2 midnights of admission.   Author: Athena Masse, MD 11/27/2021 5:06 AM  For on call review [email or web address].

## 2021-11-27 NOTE — Assessment & Plan Note (Addendum)
Compensated.  Continue furosemide. Volume management across HD.

## 2021-11-27 NOTE — Assessment & Plan Note (Addendum)
Possible superimposed bacterial infection ?Antitussives, albuterol via inhaler as needed, oxygen if needed ?Airborne precautions ?Received Rocephin and azithromycin in the ED.  Will hold off on further antibiotics for now pending procalcitonin ?

## 2021-11-27 NOTE — Progress Notes (Signed)
Hemodialysis notes ? ?HD treatment completed. Tolerated well. No complications. Total treatment time 2 hours and 30 minutes. No fluid removed as per Doctors order. ?

## 2021-11-27 NOTE — Progress Notes (Signed)
[facility]  ROUNDING NOTE   Subjective:   [name] is a 61 year old female with past medical history including diabetes, chronic diastolic heart failure, PVD, CAD, chronic respiratory failure with 2 L oxygen used at home, and end-stage renal disease on hemodialysis.  Patient presents to the emergency department with complaints of diarrhea, nausea, weakness and chills.  Patient has been admitted for under observation for Myalgia [M79.10] Pneumonia [J18.9] Community acquired pneumonia of left lower lobe of lung [J18.9]  Patient is known to our practice and receives outpatient dialysis at [facility] on a MWF schedule, supervised by Dr Candiss Norse. Her last treatment was on Monday, she received a full treatment. She states she has had diarrhea for about 3-4 days. She states she has had a cough, weakness and chills for about a week.  States she has had mild nausea with no vomiting or abdominal pain.  She has not been able to eat.  Denies known fever.  Lab work shows sodium 129, BUN 33, creatinine 6.37 with GFR 7.  Respiratory panel negative for influenza but positive for COVID-19.  Chest x-ray shows vascular congestion/edema with small left pleural effusions.  Respiratory culture and Gram stain collected and pending.  We have been consulted to manage dialysis needs during this admission   Objective:  Vital signs in last 24 hours:  Temp:  [97.7 F (36.5 C)-98.5 F (36.9 C)] 97.7 F (36.5 C) (03/08 0730) Pulse Rate:  [57-63] 60 (03/08 0730) Resp:  [14-20] 16 (03/08 0730) BP: (106-134)/(53-67) 106/53 (03/08 0730) SpO2:  [92 %-99 %] 95 % (03/08 0730)  Weight change:  There were no vitals filed for this visit.  Intake/Output: No intake/output data recorded.   Intake/Output this shift:  No intake/output data recorded.  Physical Exam: General: NAD, ill-appearing  Head: Normocephalic, atraumatic. Moist oral mucosal membranes  Eyes: Anicteric  Lungs:  Rales and rhonchi,  normal effort, 2 L [place] O2  Heart: Regular rate and rhythm  Abdomen:  Soft, nontender  Extremities: Trace peripheral edema.  Neurologic: Nonfocal, moving all four extremities  Skin: No lesions  Access: Right PermCath    Basic Metabolic Panel: Recent Labs  Lab 11/27/21 0354  NA 129*  K 4.7  CL 89*  CO2 25  GLUCOSE 99  BUN 33*  CREATININE 6.37*  CALCIUM 7.9*  MG 1.9    Liver Function Tests: Recent Labs  Lab 11/27/21 0354  AST 12*  ALT 8  ALKPHOS 103  BILITOT 0.7  PROT 6.8  ALBUMIN 3.2*   No results for input(s): LIPASE, AMYLASE in the last 168 hours. No results for input(s): AMMONIA in the last 168 hours.  CBC: Recent Labs  Lab 11/27/21 0354  WBC 7.5  NEUTROABS 4.3  HGB 12.1  HCT 39.5  MCV 88.6  PLT 319    Cardiac Enzymes: No results for input(s): CKTOTAL, CKMB, CKMBINDEX, TROPONINI in the last 168 hours.  BNP: Invalid input(s): POCBNP  CBG: No results for input(s): GLUCAP in the last 168 hours.  Microbiology: Results for orders placed or performed during the hospital encounter of 11/27/21  Resp Panel by RT-PCR (Flu A&B, Covid) Nasopharyngeal Swab     Status: Abnormal   Collection Time: 11/27/21  3:54 AM   Specimen: Nasopharyngeal Swab; Nasopharyngeal(NP) swabs in vial transport medium  Result Value Ref Range Status   SARS Coronavirus 2 by RT PCR POSITIVE (A) NEGATIVE Final    Comment: (NOTE) SARS-CoV-2 target nucleic acids are DETECTED.  The SARS-CoV-2 RNA is generally  detectable in upper respiratory specimens during the acute phase of infection. Positive results are indicative of the presence of the identified virus, but do not rule out bacterial infection or co-infection with other pathogens not detected by the test. Clinical correlation with patient history and other diagnostic information is necessary to determine patient infection status. The expected result is Negative.  Fact Sheet for  Patients: EntrepreneurPulse.com.au  Fact Sheet for Healthcare Providers: IncredibleEmployment.be  This test is not yet approved or cleared by the [place] FDA and  has been authorized for detection and/or diagnosis of SARS-CoV-2 by FDA under an Emergency Use Authorization (EUA).  This EUA will remain in effect (meaning this test can be used) for the duration of  the COVID-19 declaration under Section 564(b)(1) of the A ct, 21 U.S.C. section 360bbb-3(b)(1), unless the authorization is terminated or revoked sooner.     Influenza A by PCR NEGATIVE NEGATIVE Final   Influenza B by PCR NEGATIVE NEGATIVE Final    Comment: (NOTE) The Xpert Xpress SARS-CoV-2/FLU/RSV plus assay is intended as an aid in the diagnosis of influenza from Nasopharyngeal swab specimens and should not be used as a sole basis for treatment. Nasal washings and aspirates are unacceptable for Xpert Xpress SARS-CoV-2/FLU/RSV testing.  Fact Sheet for Patients: EntrepreneurPulse.com.au  Fact Sheet for Healthcare Providers: IncredibleEmployment.be  This test is not yet approved or cleared by the [place] FDA and has been authorized for detection and/or diagnosis of SARS-CoV-2 by FDA under an Emergency Use Authorization (EUA). This EUA will remain in effect (meaning this test can be used) for the duration of the COVID-19 declaration under Section 564(b)(1) of the Act, 21 U.S.C. section 360bbb-3(b)(1), unless the authorization is terminated or revoked.  Performed at [facility], [address] [place], [place] [address]   Blood culture (single)     Status: None (Preliminary result)   Collection Time: 11/27/21  5:04 AM   Specimen: BLOOD  Result Value Ref Range Status   Specimen Description BLOOD LEFT FA  Final   Special Requests   Final    BOTTLES DRAWN AEROBIC AND ANAEROBIC Blood Culture adequate volume   Culture   Final     NO GROWTH <12 HOURS Performed at [facility], [address] [place], [place] [address]    Report Status PENDING  Incomplete  Expectorated Sputum Assessment w Gram Stain, Rflx to Resp Cult     Status: None   Collection Time: 11/27/21  6:57 AM   Specimen: Expectorated Sputum  Result Value Ref Range Status   Specimen Description EXPECTORATED SPUTUM  Final   Special Requests NONE  Final   Sputum evaluation   Final    THIS SPECIMEN IS ACCEPTABLE FOR SPUTUM CULTURE Performed at [facility], [address] [place],  [address]    Report Status 11/27/2021 FINAL  Final    Coagulation Studies: No results for input(s): LABPROT, INR in the last 72 hours.  Urinalysis: No results for input(s): COLORURINE, LABSPEC, PHURINE, GLUCOSEU, HGBUR, BILIRUBINUR, KETONESUR, PROTEINUR, UROBILINOGEN, NITRITE, LEUKOCYTESUR in the last 72 hours.  Invalid input(s): APPERANCEUR    Imaging: DG Chest Portable 1 View  Result Date: 11/27/2021 CLINICAL DATA:  Cough and fatigue.  Question COVID. EXAM: PORTABLE CHEST 1 VIEW COMPARISON:  11/15/2021 FINDINGS: Cardiomegaly. Vascular pedicle widening. Left subclavian vein stenting. Right-sided dialysis catheter with tip at the right atrium. Diffuse congested appearance of vessels with interstitial thickening. Small left pleural effusion and hazy density at the bases similar to  prior. IMPRESSION: Vascular congestion/edema and small left pleural effusion that is similar to priors. Superimposed pneumonia could easily be obscured. Electronically Signed   By: Jorje Guild M.D.   On: 11/27/2021 04:26     Medications:    sodium chloride     sodium chloride     [START ON 11/28/2021] azithromycin (ZITHROMAX) 500 MG IVPB (Vial-Mate [employer])     [START ON 11/28/2021] cefTRIAXone (ROCEPHIN)  IV      albuterol  2 puff Inhalation Q6H   Chlorhexidine Gluconate Cloth  6 each Topical Q0600   heparin  5,000 Units Subcutaneous Q8H   sodium chloride,  sodium chloride, acetaminophen, alteplase, chlorpheniramine-HYDROcodone, guaiFENesin-dextromethorphan, heparin, HYDROcodone-acetaminophen, lidocaine (PF), lidocaine-prilocaine, pentafluoroprop-tetrafluoroeth  Assessment/ Plan:  Ms. DAMYAH GUGEL is a 61 y.o.  female past medical history including diabetes, chronic diastolic heart failure, PVD, CAD, chronic respiratory failure with 2 L oxygen used at home, and end-stage renal disease on hemodialysis.  Patient presents to the emergency department with complaints of diarrhea, nausea, weakness and chills.  Patient has been admitted for under observation for Myalgia [M79.10] Pneumonia [J18.9] Community acquired pneumonia of left lower lobe of lung [J18.9]  [facility]/MWF/right PermCath/73 kg  Community-acquired pneumonia due to COVID-19 virus.  Patient received Rocephin and azithromycin in ED.  Providing supportive care such as antitussives, inhalers, supplemental oxygen.  2.  End-stage renal disease requiring hemodialysis.  Plan to dialyze patient later today to maintain outpatient schedule.  No UF due to diarrhea, nausea, and lack of oral intake.  3. Anemia of chronic kidney disease  Normocytic Lab Results  Component Value Date   HGB 12.1 11/27/2021  Hemoglobin at desired goal. Mircera received outpatient every 2 weeks.  4. .Secondary Hyperparathyroidism:  Lab Results  Component Value Date   PTH 357 (H) 10/05/2018   CALCIUM 7.9 (L) 11/27/2021   CAION 1.09 (L) 04/13/2020   PHOS 3.8 05/10/2021    Calcium slightly decreased. Calcitriol received outpatient    LOS: 0   3/8/20231:56 PM

## 2021-11-27 NOTE — Assessment & Plan Note (Signed)
Per patient report, tested positive for COVID-19 at the outpatient dialysis center more than a week ago. Hypoxia no worse than her baseline and requiring 2 L/min [place] oxygen. Discussed in detail with pharmacy, given duration of her COVID infection, no antivirals initiated.  No indication for steroids in the absence of worsening hypoxia. Supportive treatment.

## 2021-11-27 NOTE — Assessment & Plan Note (Signed)
Continue aspirin and atorvastatin with nitroglycerin as needed chest pain

## 2021-11-27 NOTE — Assessment & Plan Note (Addendum)
Continue amlodipine, hydralazine Reasonably controlled.

## 2021-11-28 ENCOUNTER — Ambulatory Visit (INDEPENDENT_AMBULATORY_CARE_PROVIDER_SITE_OTHER): Payer: Medicare Other | Admitting: Vascular Surgery

## 2021-11-28 ENCOUNTER — Ambulatory Visit: Payer: Medicaid Other | Admitting: Urology

## 2021-11-28 DIAGNOSIS — J9611 Chronic respiratory failure with hypoxia: Secondary | ICD-10-CM | POA: Diagnosis not present

## 2021-11-28 DIAGNOSIS — R5381 Other malaise: Secondary | ICD-10-CM | POA: Diagnosis present

## 2021-11-28 DIAGNOSIS — J9621 Acute and chronic respiratory failure with hypoxia: Secondary | ICD-10-CM | POA: Diagnosis present

## 2021-11-28 DIAGNOSIS — I3139 Other pericardial effusion (noninflammatory): Secondary | ICD-10-CM | POA: Diagnosis present

## 2021-11-28 DIAGNOSIS — I10 Essential (primary) hypertension: Secondary | ICD-10-CM | POA: Diagnosis not present

## 2021-11-28 DIAGNOSIS — I5032 Chronic diastolic (congestive) heart failure: Secondary | ICD-10-CM | POA: Diagnosis present

## 2021-11-28 DIAGNOSIS — N186 End stage renal disease: Secondary | ICD-10-CM | POA: Diagnosis present

## 2021-11-28 DIAGNOSIS — H9193 Unspecified hearing loss, bilateral: Secondary | ICD-10-CM | POA: Diagnosis present

## 2021-11-28 DIAGNOSIS — Z9981 Dependence on supplemental oxygen: Secondary | ICD-10-CM | POA: Diagnosis not present

## 2021-11-28 DIAGNOSIS — I5021 Acute systolic (congestive) heart failure: Secondary | ICD-10-CM | POA: Diagnosis not present

## 2021-11-28 DIAGNOSIS — M791 Myalgia, unspecified site: Secondary | ICD-10-CM | POA: Diagnosis present

## 2021-11-28 DIAGNOSIS — I081 Rheumatic disorders of both mitral and tricuspid valves: Secondary | ICD-10-CM | POA: Diagnosis present

## 2021-11-28 DIAGNOSIS — E1151 Type 2 diabetes mellitus with diabetic peripheral angiopathy without gangrene: Secondary | ICD-10-CM | POA: Diagnosis present

## 2021-11-28 DIAGNOSIS — J1282 Pneumonia due to coronavirus disease 2019: Secondary | ICD-10-CM | POA: Diagnosis present

## 2021-11-28 DIAGNOSIS — E1122 Type 2 diabetes mellitus with diabetic chronic kidney disease: Secondary | ICD-10-CM | POA: Diagnosis present

## 2021-11-28 DIAGNOSIS — I2510 Atherosclerotic heart disease of native coronary artery without angina pectoris: Secondary | ICD-10-CM | POA: Diagnosis present

## 2021-11-28 DIAGNOSIS — J159 Unspecified bacterial pneumonia: Secondary | ICD-10-CM | POA: Diagnosis present

## 2021-11-28 DIAGNOSIS — N2581 Secondary hyperparathyroidism of renal origin: Secondary | ICD-10-CM | POA: Diagnosis present

## 2021-11-28 DIAGNOSIS — Z1624 Resistance to multiple antibiotics: Secondary | ICD-10-CM | POA: Diagnosis present

## 2021-11-28 DIAGNOSIS — E669 Obesity, unspecified: Secondary | ICD-10-CM | POA: Diagnosis present

## 2021-11-28 DIAGNOSIS — D631 Anemia in chronic kidney disease: Secondary | ICD-10-CM | POA: Diagnosis present

## 2021-11-28 DIAGNOSIS — J440 Chronic obstructive pulmonary disease with acute lower respiratory infection: Secondary | ICD-10-CM | POA: Diagnosis present

## 2021-11-28 DIAGNOSIS — I110 Hypertensive heart disease with heart failure: Secondary | ICD-10-CM | POA: Diagnosis not present

## 2021-11-28 DIAGNOSIS — U071 COVID-19: Secondary | ICD-10-CM | POA: Diagnosis present

## 2021-11-28 DIAGNOSIS — E871 Hypo-osmolality and hyponatremia: Secondary | ICD-10-CM | POA: Diagnosis present

## 2021-11-28 DIAGNOSIS — I132 Hypertensive heart and chronic kidney disease with heart failure and with stage 5 chronic kidney disease, or end stage renal disease: Secondary | ICD-10-CM | POA: Diagnosis present

## 2021-11-28 DIAGNOSIS — G928 Other toxic encephalopathy: Secondary | ICD-10-CM | POA: Diagnosis present

## 2021-11-28 DIAGNOSIS — E785 Hyperlipidemia, unspecified: Secondary | ICD-10-CM | POA: Diagnosis present

## 2021-11-28 DIAGNOSIS — Z992 Dependence on renal dialysis: Secondary | ICD-10-CM | POA: Diagnosis not present

## 2021-11-28 LAB — GLUCOSE, CAPILLARY
Glucose-Capillary: 72 mg/dL (ref 70–99)
Glucose-Capillary: 75 mg/dL (ref 70–99)
Glucose-Capillary: 76 mg/dL (ref 70–99)
Glucose-Capillary: 74 mg/dL (ref 70–99)
Glucose-Capillary: 114 mg/dL — ABNORMAL HIGH (ref 70–99)
Glucose-Capillary: 140 mg/dL — ABNORMAL HIGH (ref 70–99)

## 2021-11-28 LAB — BLOOD GAS, ARTERIAL
Acid-Base Excess: 2.1 mmol/L — ABNORMAL HIGH (ref 0.0–2.0)
Bicarbonate: 28.7 mmol/L — ABNORMAL HIGH (ref 20.0–28.0)
FIO2: 32 %
O2 Saturation: 98.9 %
Patient temperature: 37
pCO2 arterial: 52 mmHg — ABNORMAL HIGH (ref 32–48)
pH, Arterial: 7.35 (ref 7.35–7.45)
pO2, Arterial: 108 mmHg (ref 83–108)

## 2021-11-28 LAB — PROCALCITONIN: Procalcitonin: 0.52 ng/mL

## 2021-11-28 MED ORDER — NALOXONE HCL 0.4 MG/ML IJ SOLN
0.4000 mg | INTRAMUSCULAR | Status: DC | PRN
  Administered 2021-11-28: 14:00:00 0.4 mg via INTRAVENOUS
  Filled 2021-11-28: qty 1

## 2021-11-28 MED ORDER — ACETAMINOPHEN 325 MG PO TABS
650.0000 mg | ORAL_TABLET | Freq: Four times a day (QID) | ORAL | Status: DC | PRN
  Administered 2021-11-29 – 2021-12-04 (×7): 650 mg via ORAL
  Filled 2021-11-28 (×6): qty 2

## 2021-11-28 NOTE — Progress Notes (Signed)
PT Cancellation Note ? ?Patient Details ?Name: [name] ?MRN: [id number] ?DOB: Jul 04, 1961 ? ? ?Cancelled Treatment:    Reason Eval/Treat Not Completed: Fatigue/lethargy limiting ability to participate. Orders received and chart reviewed. Per RN pt very lethargic, stating pt not appropriate for PT at this time. Requesting to attempt in afternoon. PT to re-attempt at later time/date. ? ? ?Salem Caster. Fairly IV, PT, DPT ?Physical Therapist- [place]  ?[facility]  ?11/28/2021, 10:09 AM ?

## 2021-11-28 NOTE — Progress Notes (Signed)
?   11/28/21 1410  ?Clinical Encounter Type  ?Visited With Patient not available  ?Visit Type Initial;Other (Comment) ?(rapid response)  ? ?Chaplain Burris attended Pt's room after rapid response was called. Pt not available while receiving care intervention. Chaplain offered silent prayer and will f/u as needed for ongoing support. ?

## 2021-11-28 NOTE — TOC CM/SW Note (Addendum)
Called in room to give observation notice. The person that answered the phone asked CSW to call back later. ? ?Dayton Scrape, [place] ?[phone] ? ?4:24 pm: Tried calling patient in room. No answer. According to chart rapid response was called earlier. ? ?Dayton Scrape, [place] ?[phone] ? ?

## 2021-11-28 NOTE — Assessment & Plan Note (Addendum)
Acute toxic metabolic encephalopathy On 3/9, RN alerted that patient was barely arousable, respiratory rate of 10. Ordered Narcan, ABG and advised to activate rapid response team. Improved after dose of Narcan. Likely related to Vicodin, Tussionex complicating dialysis patient. Discontinued all opioids and minimize use of same. Mental status has significantly improved over the last couple of days.  Patient has not reported much pain. Encephalopathy resolved.

## 2021-11-28 NOTE — Progress Notes (Signed)
PT Cancellation Note ? ?Patient Details ?Name: [name] ?MRN: [id number] ?DOB: [birth year] ? ? ?Cancelled Treatment:    Reason Eval/Treat Not Completed: Medical issues which prohibited therapy. 2nd attempt made. Per medical team rapid response called on this patient. Will re-attempt at later time and date as medically appropriate. ? ? ?Salem Caster. Fairly IV, PT, DPT ?Physical Therapist- [place]  ?[facility]  ?11/28/2021, 2:17 PM ?

## 2021-11-28 NOTE — Progress Notes (Addendum)
PROGRESS NOTE   [name]  NLG:[id number]    DOB: 08/19/1961    DOA: 11/27/2021  PCP: Denton Lank, MD   I have briefly reviewed patients previous medical records in [facility].  Chief Complaint  Patient presents with   Generalized Body Aches    Hospital Course:  61 year old female with medical history significant for ESRD on TTS HD, type II DM, chronic diastolic CHF, CAD, PAD, chronic respiratory failure on home oxygen 2 L/min, COPD, anemia, HLD, GERD, hard of hearing, presented to the ED with 7 to 10 days of cough, chills, weakness and myalgia.  Reported testing positive for COVID 19 at the dialysis center more than a week ago.  Denies dyspnea or chest pain.  Developed self-limiting diarrhea 3 days PTA without nausea, vomiting or abdominal pain.  Admitted for bacterial pneumonia superimposed on recent COVID-19 infection.  Nephrology consulted for HD needs.  On 3/9, patient obtunded, rapid response initiated, improved after a dose of Narcan, opioids discontinued.   Assessment & Plan:  Active Problems:   Unresponsiveness   Bacterial pneumonia   COVID-19 virus infection   Chronic respiratory failure with hypoxia (HCC)   End-stage renal disease on hemodialysis (HCC)   Hyponatremia   HTN (hypertension)   Diabetes mellitus, type II (HCC)   Chronic diastolic CHF (congestive heart failure) (HCC)   Coronary artery disease involving native coronary artery of native heart without angina pectoris   Assessment and Plan: Unresponsiveness Acute toxic metabolic encephalopathy On 3/9, RN alerted that patient was barely arousable, respiratory rate of 10. Ordered Narcan, ABG and advised to activate rapid response team. Promptly arrived at bedside by which time patient had already received Narcan and was alert, somewhat confused, kept asking what had happened to her. Likely related to Vicodin, Tussionex complicating dialysis patient. Discontinued all opioids and minimize use of  same. Monitor closely including continuous pulse oximetry, telemetry.  If has recurrent episode, may need to give additional dose of Narcan.  Discussed with rapid response RN and patient's RN at bedside.  Bacterial pneumonia Based on symptomatology including productive cough and chest x-ray findings. Procalcitonin elevated but unclear significance in the context of ESRD patient. Continue empirically started IV ceftriaxone and azithromycin. Supportive care.  Improving. Aggressive pulmonary toilet. Needed suctioning during her unresponsive status.  COVID-19 virus infection Per patient report, tested positive for COVID-19 at the outpatient dialysis center more than a week ago. Hypoxia no worse than her baseline and requiring 2 L/min [place] oxygen. Discussed in detail with pharmacy, given duration of her COVID infection, no antivirals initiated.  No indication for steroids in the absence of worsening hypoxia. Supportive treatment.  Chronic respiratory failure with hypoxia (HCC) 2 L/min O2 requirement at baseline. Appears to be at baseline.  Unclear why she is on 4 L well saturating in the high 90s.  Needs to be titrated down to her home level. ABG this afternoon during unresponsive episode, pH 7.35, PCO2 52, PO2 108 and oxygen saturation 98.9  End-stage renal disease on hemodialysis [facility]) Nephrology consulted for HD needs.  Hyponatremia Sodium 129 but at baseline Her baseline and likely related to ESRD and HD. Management per nephrology.  HTN (hypertension) Continue amlodipine, hydralazine Controlled.  Diabetes mellitus, type II ([place]) Sliding scale insulin coverage pending home med verification Had a couple of hypoglycemic episodes early this morning.  Continue to monitor CBGs closely and treat per hypoglycemia protocol.  Chronic diastolic CHF (congestive heart failure) (HCC) Compensated.  Continue furosemide. Volume management across  HD.  Coronary artery disease involving native  coronary artery of native heart without angina pectoris Continue aspirin and atorvastatin with nitroglycerin as needed chest pain   Body mass index is 30.04 kg/m.     DVT prophylaxis: heparin injection 5,000 Units Start: 11/27/21 0600     Code Status: Full Code:  Family Communication: Discussed with son, updated care and answered questions Disposition:  Status is: inpatient.    Consultants:   Nephrology  Procedures:     Antimicrobials:   As above   Subjective:  When seen this morning, lethargic, unable or unwilling to answer questions.  RN was at bedside.  Subsequently this afternoon, RN sent a secure chat stating patient was less responsive/barely responsive, respiratory rate 10.  Please see discussion above.  Ongoing nausea this morning but not sure if she is really vomiting.  Objective:   Vitals:   11/27/21 2030 11/28/21 0514 11/28/21 1346 11/28/21 1711  BP: (!) 151/59 (!) 148/58 128/81 (!) 140/56  Pulse: 71 60 66 75  Resp: '17 12 15 15  '$ Temp: 97.8 F (36.6 C) 97.8 F (36.6 C) 97.6 F (36.4 C)   TempSrc: Oral Oral Oral Oral  SpO2: 99% 100%    Weight:      Height:        General exam: Middle-age female, moderately built and nourished lying propped up in bed, alert and oriented to self, somewhat confused and intermittently tearful and asking for her son to come right now.  Exhibiting some paranoia. Respiratory system: Slightly harsh breath sounds bilaterally but no clear crackles, wheezing or rhonchi appreciated.  Respiratory effort normal. Cardiovascular system: S1 & S2 heard, RRR. No JVD, murmurs, rubs, gallops or clicks. No pedal edema.   Gastrointestinal system: Abdomen is nondistended, soft and nontender. No organomegaly or masses felt. Normal bowel sounds heard. Central nervous system: Alert and oriented x1 this afternoon. No focal neurological deficits. Extremities: Symmetric 5 x 5 power. Skin: No rashes, lesions or ulcers Psychiatry: Judgement and insight  appear normal. Mood & affect appropriate.     Data Reviewed:   I have personally reviewed following labs and imaging studies   CBC: Recent Labs  Lab 11/27/21 0354  WBC 7.5  NEUTROABS 4.3  HGB 12.1  HCT 39.5  MCV 88.6  PLT 734    Basic Metabolic Panel: Recent Labs  Lab 11/27/21 0354  NA 129*  K 4.7  CL 89*  CO2 25  GLUCOSE 99  BUN 33*  CREATININE 6.37*  CALCIUM 7.9*  MG 1.9    Liver Function Tests: Recent Labs  Lab 11/27/21 0354  AST 12*  ALT 8  ALKPHOS 103  BILITOT 0.7  PROT 6.8  ALBUMIN 3.2*    CBG: Recent Labs  Lab 11/28/21 0612 11/28/21 1336 11/28/21 1403  GLUCAP 72 76 75    Microbiology Studies:   Recent Results (from the past 240 hour(s))  Resp Panel by RT-PCR (Flu A&B, Covid) Nasopharyngeal Swab     Status: Abnormal   Collection Time: 11/27/21  3:54 AM   Specimen: Nasopharyngeal Swab; Nasopharyngeal(NP) swabs in vial transport medium  Result Value Ref Range Status   SARS Coronavirus 2 by RT PCR POSITIVE (A) NEGATIVE Final    Comment: (NOTE) SARS-CoV-2 target nucleic acids are DETECTED.  The SARS-CoV-2 RNA is generally detectable in upper respiratory specimens during the acute phase of infection. Positive results are indicative of the presence of the identified virus, but do not rule out bacterial infection or co-infection with other pathogens  not detected by the test. Clinical correlation with patient history and other diagnostic information is necessary to determine patient infection status. The expected result is Negative.  Fact Sheet for Patients: EntrepreneurPulse.com.au  Fact Sheet for Healthcare Providers: IncredibleEmployment.be  This test is not yet approved or cleared by the [place] FDA and  has been authorized for detection and/or diagnosis of SARS-CoV-2 by FDA under an Emergency Use Authorization (EUA).  This EUA will remain in effect (meaning this test can be used) for the  duration of  the COVID-19 declaration under Section 564(b)(1) of the A ct, 21 U.S.C. section 360bbb-3(b)(1), unless the authorization is terminated or revoked sooner.     Influenza A by PCR NEGATIVE NEGATIVE Final   Influenza B by PCR NEGATIVE NEGATIVE Final    Comment: (NOTE) The Xpert Xpress SARS-CoV-2/FLU/RSV plus assay is intended as an aid in the diagnosis of influenza from Nasopharyngeal swab specimens and should not be used as a sole basis for treatment. Nasal washings and aspirates are unacceptable for Xpert Xpress SARS-CoV-2/FLU/RSV testing.  Fact Sheet for Patients: EntrepreneurPulse.com.au  Fact Sheet for Healthcare Providers: IncredibleEmployment.be  This test is not yet approved or cleared by the [place] FDA and has been authorized for detection and/or diagnosis of SARS-CoV-2 by FDA under an Emergency Use Authorization (EUA). This EUA will remain in effect (meaning this test can be used) for the duration of the COVID-19 declaration under Section 564(b)(1) of the Act, 21 U.S.C. section 360bbb-3(b)(1), unless the authorization is terminated or revoked.  Performed at [facility], [address] [place], [place] [address]   Blood culture (single)     Status: None (Preliminary result)   Collection Time: 11/27/21  5:04 AM   Specimen: BLOOD  Result Value Ref Range Status   Specimen Description BLOOD LEFT FA  Final   Special Requests   Final    BOTTLES DRAWN AEROBIC AND ANAEROBIC Blood Culture adequate volume   Culture   Final    NO GROWTH 1 DAY Performed at [facility], [address] [place], [place] [address]    Report Status PENDING  Incomplete  Expectorated Sputum Assessment w Gram Stain, Rflx to Resp Cult     Status: None   Collection Time: 11/27/21  6:57 AM   Specimen: Expectorated Sputum  Result Value Ref Range Status   Specimen Description EXPECTORATED SPUTUM  Final   Special Requests NONE   Final   Sputum evaluation   Final    THIS SPECIMEN IS ACCEPTABLE FOR SPUTUM CULTURE Performed at [facility], [address], [place], [place] [address]    Report Status 11/27/2021 FINAL  Final  Culture, Respiratory w Gram Stain     Status: None (Preliminary result)   Collection Time: 11/27/21  6:57 AM  Result Value Ref Range Status   Specimen Description   Final    EXPECTORATED SPUTUM Performed at [facility], [address], [place], [place] [address]    Special Requests   Final    NONE Reflexed from [id number] Performed at [facility], [place], [place] [address]    Gram Stain   Final    MODERATE SQUAMOUS EPITHELIAL CELLS PRESENT FEW WBC PRESENT,BOTH PMN AND MONONUCLEAR FEW BUDDING YEAST SEEN FEW GRAM POSITIVE COCCI FEW GRAM NEGATIVE RODS    Culture   Final    ABUNDANT ESCHERICHIA COLI SUSCEPTIBILITIES TO FOLLOW Performed at [facility], [place] [address] [place], [place] [address]  Report Status PENDING  Incomplete    Radiology Studies:  DG Chest Portable 1 View  Result Date: 11/27/2021 CLINICAL DATA:  Cough and fatigue.  Question COVID. EXAM: PORTABLE CHEST 1 VIEW COMPARISON:  11/15/2021 FINDINGS: Cardiomegaly. Vascular pedicle widening. Left subclavian vein stenting. Right-sided dialysis catheter with tip at the right atrium. Diffuse congested appearance of vessels with interstitial thickening. Small left pleural effusion and hazy density at the bases similar to prior. IMPRESSION: Vascular congestion/edema and small left pleural effusion that is similar to priors. Superimposed pneumonia could easily be obscured. Electronically Signed   By: Jorje Guild M.D.   On: 11/27/2021 04:26    Scheduled Meds:    albuterol  2 puff Inhalation Q6H   Chlorhexidine Gluconate Cloth  6 each Topical Q0600   feeding supplement  1 Container Oral TID BM   heparin  5,000 Units Subcutaneous Q8H   multivitamin  1 tablet Oral QHS     Continuous Infusions:    azithromycin (ZITHROMAX) 500 MG IVPB (Vial-Mate Adaptor) 500 mg (11/28/21 0957)   cefTRIAXone (ROCEPHIN)  IV Stopped (11/28/21 1633)     LOS: 0 days     Vernell Leep, MD,  FACP, [facility], [facility], [facility] (Care Management Physician Certified) [place]  To contact the attending provider between 7A-7P or the covering provider during after hours 7P-7A, please log into the web site [email or web address] and access using universal [place] password for that web site. If you do not have the password, please call the hospital operator.  11/28/2021, 5:56 PM

## 2021-11-28 NOTE — Significant Event (Signed)
Rapid Response Event Note  ? ?Reason for Call :  ?Decreased responsiveness ? ?Initial Focused Assessment:  ?Rapid response RN arrived in patient's room with patient surrounded by [facility] staff and OT. Patient weakly opening eyes but barely speaking when first arrived. Per [place] staff patient had been tired and difficult to arouse all day but had progressed to being barely arousable to sternal rub. 2C RN already in communication with patient's MD and got order for narcan. CBG 75. Patients vital signs showed HR 66 SR, RR 14 at this time (but patient's RN reports got as low as 10), BP 128/81 MAP 89, temperature 98.4 oral. Oxygen saturations mid 90s on 4 L [place]. ? ?Interventions:  ?Rapid response RN arrived as 2C RN was administering narcan dose. RT obtained ABG per order. After narcan administration patient became alert but tearful and complaining of her "insides being on fire". Able to answer questions, move all extremities, and squeeze hands bilaterally. This sensation passed but patient still asking lots of questions about what happened, even after staff including MD at bedside explained the sedating medications and narcan administration. ? ?Plan of Care:  ?Patient to remain on 2C for now. Patient already on cardiac monitoring and continuous pulse ox, 2C nurse to reach out to central monitoring to see if they will monitor respirations and alert staff as well when respirations get too low. This RN and MD separately talked to the [facility] RN to watch out for future lethargy in case the narcan wears off before the sedating medications. ? ?Event Summary:  ? ?MD Notified: Dr. Wayland Denis ?Call Time: 13:56 ?Arrival Time: 13:58 ?End Time: 14:20 ? ?Stephanie Acre, RN ?

## 2021-11-28 NOTE — Progress Notes (Signed)
[facility]  ROUNDING NOTE   Subjective:   [name] is a 61 year old female with past medical history including diabetes, chronic diastolic heart failure, PVD, CAD, chronic respiratory failure with 2 L oxygen used at home, and end-stage renal disease on hemodialysis.  Patient presents to the emergency department with complaints of diarrhea, nausea, weakness and chills.  Patient has been admitted for under observation for Myalgia [M79.10] Pneumonia [J18.9] Community acquired pneumonia of left lower lobe of lung [J18.9]  Patient is known to our practice and receives outpatient dialysis at [facility] on a MWF schedule, supervised by Dr Candiss Norse.   Patient seen resting quietly Very somnolent today Arousable but falls asleep quickly Cough remains  Dialysis yesterday, tolerated well   Objective:  Vital signs in last 24 hours:  Temp:  [97.6 F (36.4 C)-98.4 F (36.9 C)] 97.6 F (36.4 C) (03/09 1346) Pulse Rate:  [58-71] 66 (03/09 1346) Resp:  [10-25] 15 (03/09 1346) BP: (106-151)/(47-81) 128/81 (03/09 1346) SpO2:  [96 %-100 %] 100 % (03/09 0514) Weight:  [74.5 kg] 74.5 kg (03/08 1940)  Weight change:  Filed Weights   11/27/21 1524 11/27/21 1638 11/27/21 1940  Weight: 74.5 kg 74.5 kg 74.5 kg    Intake/Output: No intake/output data recorded.   Intake/Output this shift:  Total I/O In: 350 [IV Piggyback:350] Out: -   Physical Exam: General: NAD, ill-appearing  Head: Normocephalic, atraumatic. Moist oral mucosal membranes  Eyes: Anicteric  Lungs:  Rales and rhonchi, normal effort, 2 L [place] O2  Heart: Regular rate and rhythm  Abdomen:  Soft, nontender  Extremities: Trace peripheral edema.  Neurologic: Nonfocal, moving all four extremities  Skin: No lesions  Access: Right PermCath    Basic Metabolic Panel: Recent Labs  Lab 11/27/21 0354  NA 129*  K 4.7  CL 89*  CO2 25  GLUCOSE 99  BUN 33*  CREATININE 6.37*  CALCIUM 7.9*  MG 1.9     Liver  Function Tests: Recent Labs  Lab 11/27/21 0354  AST 12*  ALT 8  ALKPHOS 103  BILITOT 0.7  PROT 6.8  ALBUMIN 3.2*    No results for input(s): LIPASE, AMYLASE in the last 168 hours. No results for input(s): AMMONIA in the last 168 hours.  CBC: Recent Labs  Lab 11/27/21 0354  WBC 7.5  NEUTROABS 4.3  HGB 12.1  HCT 39.5  MCV 88.6  PLT 319     Cardiac Enzymes: No results for input(s): CKTOTAL, CKMB, CKMBINDEX, TROPONINI in the last 168 hours.  BNP: Invalid input(s): POCBNP  CBG: Recent Labs  Lab 11/28/21 0612 11/28/21 1336 11/28/21 1403  GLUCAP 72 76 75    Microbiology: Results for orders placed or performed during the hospital encounter of 11/27/21  Resp Panel by RT-PCR (Flu A&B, Covid) Nasopharyngeal Swab     Status: Abnormal   Collection Time: 11/27/21  3:54 AM   Specimen: Nasopharyngeal Swab; Nasopharyngeal(NP) swabs in vial transport medium  Result Value Ref Range Status   SARS Coronavirus 2 by RT PCR POSITIVE (A) NEGATIVE Final    Comment: (NOTE) SARS-CoV-2 target nucleic acids are DETECTED.  The SARS-CoV-2 RNA is generally detectable in upper respiratory specimens during the acute phase of infection. Positive results are indicative of the presence of the identified virus, but do not rule out bacterial infection or co-infection with other pathogens not detected by the test. Clinical correlation with patient history and other diagnostic information is necessary to determine patient infection status. The expected result is  Negative.  Fact Sheet for Patients: EntrepreneurPulse.com.au  Fact Sheet for Healthcare Providers: IncredibleEmployment.be  This test is not yet approved or cleared by the [place] FDA and  has been authorized for detection and/or diagnosis of SARS-CoV-2 by FDA under an Emergency Use Authorization (EUA).  This EUA will remain in effect (meaning this test can be used) for the duration of   the COVID-19 declaration under Section 564(b)(1) of the A ct, 21 U.S.C. section 360bbb-3(b)(1), unless the authorization is terminated or revoked sooner.     Influenza A by PCR NEGATIVE NEGATIVE Final   Influenza B by PCR NEGATIVE NEGATIVE Final    Comment: (NOTE) The Xpert Xpress SARS-CoV-2/FLU/RSV plus assay is intended as an aid in the diagnosis of influenza from Nasopharyngeal swab specimens and should not be used as a sole basis for treatment. Nasal washings and aspirates are unacceptable for Xpert Xpress SARS-CoV-2/FLU/RSV testing.  Fact Sheet for Patients: EntrepreneurPulse.com.au  Fact Sheet for Healthcare Providers: IncredibleEmployment.be  This test is not yet approved or cleared by the [place] FDA and has been authorized for detection and/or diagnosis of SARS-CoV-2 by FDA under an Emergency Use Authorization (EUA). This EUA will remain in effect (meaning this test can be used) for the duration of the COVID-19 declaration under Section 564(b)(1) of the Act, 21 U.S.C. section 360bbb-3(b)(1), unless the authorization is terminated or revoked.  Performed at [facility], [address] [place], [place] [address]   Blood culture (single)     Status: None (Preliminary result)   Collection Time: 11/27/21  5:04 AM   Specimen: BLOOD  Result Value Ref Range Status   Specimen Description BLOOD LEFT FA  Final   Special Requests   Final    BOTTLES DRAWN AEROBIC AND ANAEROBIC Blood Culture adequate volume   Culture   Final    NO GROWTH 1 DAY Performed at [facility], [address] [place] [address] [place], [place] [address]    Report Status PENDING  Incomplete  Expectorated Sputum Assessment w Gram Stain, Rflx to Resp Cult     Status: None   Collection Time: 11/27/21  6:57 AM   Specimen: Expectorated Sputum  Result Value Ref Range Status   Specimen Description EXPECTORATED SPUTUM  Final   Special Requests NONE  Final    Sputum evaluation   Final    THIS SPECIMEN IS ACCEPTABLE FOR SPUTUM CULTURE Performed at [facility], [address] [place], [place] [address]    Report Status 11/27/2021 FINAL  Final  Culture, Respiratory w Gram Stain     Status: None (Preliminary result)   Collection Time: 11/27/21  6:57 AM  Result Value Ref Range Status   Specimen Description   Final    EXPECTORATED SPUTUM Performed at [facility], [address] [place], [place] [address]    Special Requests   Final    NONE Reflexed from [id number] Performed at [facility], [place], [place] [address]    Gram Stain   Final    MODERATE SQUAMOUS EPITHELIAL CELLS PRESENT FEW WBC PRESENT,BOTH PMN AND MONONUCLEAR FEW BUDDING YEAST SEEN FEW GRAM POSITIVE COCCI FEW GRAM NEGATIVE RODS    Culture   Final    ABUNDANT ESCHERICHIA COLI SUSCEPTIBILITIES TO FOLLOW Performed at [facility], [place] [address], [place],  [address]    Report Status PENDING  Incomplete    Coagulation Studies: No results for input(s): LABPROT, INR in the last 72 hours.  Urinalysis: No  results for input(s): COLORURINE, LABSPEC, [place], GLUCOSEU, HGBUR, BILIRUBINUR, KETONESUR, PROTEINUR, UROBILINOGEN, NITRITE, LEUKOCYTESUR in the last 72 hours.  Invalid input(s): APPERANCEUR    Imaging: DG Chest Portable 1 View  Result Date: 11/27/2021 CLINICAL DATA:  Cough and fatigue.  Question COVID. EXAM: PORTABLE CHEST 1 VIEW COMPARISON:  11/15/2021 FINDINGS: Cardiomegaly. Vascular pedicle widening. Left subclavian vein stenting. Right-sided dialysis catheter with tip at the right atrium. Diffuse congested appearance of vessels with interstitial thickening. Small left pleural effusion and hazy density at the bases similar to prior. IMPRESSION: Vascular congestion/edema and small left pleural effusion that is similar to priors. Superimposed pneumonia could easily be obscured. Electronically Signed   By: Jorje Guild M.D.   On: 11/27/2021 04:26     Medications:    azithromycin (ZITHROMAX) 500 MG IVPB (Vial-Mate Adaptor) 500 mg (11/28/21 0957)   cefTRIAXone (ROCEPHIN)  IV 1 g (11/28/21 0813)    albuterol  2 puff Inhalation Q6H   Chlorhexidine Gluconate Cloth  6 each Topical Q0600   feeding supplement  1 Container Oral TID BM   heparin  5,000 Units Subcutaneous Q8H   multivitamin  1 tablet Oral QHS   acetaminophen, guaiFENesin-dextromethorphan, naLOXone (NARCAN)  injection, promethazine  Assessment/ Plan:  [name] is a 61 y.o.  female past medical history including diabetes, chronic diastolic heart failure, PVD, CAD, chronic respiratory failure with 2 L oxygen used at home, and end-stage renal disease on hemodialysis.  Patient presents to the emergency department with complaints of diarrhea, nausea, weakness and chills.  Patient has been admitted for under observation for Myalgia [M79.10] Pneumonia [J18.9] Community acquired pneumonia of left lower lobe of lung [J18.9]  [facility]/MWF/right PermCath/73 kg  Community-acquired pneumonia due to COVID-19 virus.  Patient received Rocephin and azithromycin in ED. Antibiotics continued. Providing supportive care such as antitussives, inhalers, supplemental oxygen.  2.  End-stage renal disease requiring hemodialysis.    Dialysis received yesterday, no UF. Tolerated well. Next treatment scheduled for Friday.  3. Anemia of chronic kidney disease  Normocytic Lab Results  Component Value Date   HGB 12.1 11/27/2021  Hemoglobin at goal. Mircera received outpatient every 2 weeks.  4. .Secondary Hyperparathyroidism:  Lab Results  Component Value Date   PTH 357 (H) 10/05/2018   CALCIUM 7.9 (L) 11/27/2021   CAION 1.09 (L) 04/13/2020   PHOS 3.8 05/10/2021    Calcitriol received outpatient Will order updated labs in am   LOS: 0   3/9/20232:23 PM

## 2021-11-28 NOTE — Progress Notes (Signed)
RAPID RESPONSE NOTE ? ?I was called by Donavan Foil, CNA. She reported that the patient was not responding to her, and making "gurgling" noises. Upon assessment Patient is gurgling and coughing up sputum, and is drooling. Oral suctioning was done, but did not get a response from patient. Patient is very lethargic and not responding to [place] calling her name. Oxygen saturation 88% on 2L, bumped O2 up to 4L/min, and respiratory rate was 10. ? ?TIMELINE ? ?1330- Called by CNA ? ?58- Contacted doctor about concerns ? ?8421- Doctor responded with orders for stat CBG, ABG, Narcan, and to call a Rapid Response ? ?1356- Rapid Response called ? ?1400- Narcan given  ? ?1405- Patient is responsive again, and is now mad at [place] and yelling. She doesn't understand what's going on. MD has discontinued all Opioid medications, and even recommends to hold off on the Phenergan for now. Will continue to monitor. ?

## 2021-11-28 NOTE — Evaluation (Signed)
Occupational Therapy Evaluation ?Patient Details ?Name: [name] ?MRN: [id number] ?DOB: 1961-09-04 ?Today's Date: 11/28/2021 ? ? ?History of Present Illness [name] is a 61 y.o. female with medical history significant for ESRD on HD TTS, DM, chronic diastolic heart failure, CAD, PVD, Chronic respiratory failure on home O2 at 2 L, who presents to the ED with a 7-10 day history of cough, chills, weakness, myalgia.  Reports recently testing positive for COVID.  Denies shortness of breath or chest pain.  Developed diarrhea 3 days ago.  Has no nausea, vomiting, or abdominal pain .  ? ?Clinical Impression ?  ?[name] presents with generalized weakness, limited endurance, fatigue, confusion, and dyspnea. She denies pain. At baseline, pt uses 2L O2, but today requires 4L to keep O2 sats in mid-upper 90s. She lives alone with her cat in an apartment, has been Mod I in most ADL, although takes only sponge baths. She uses a RW or a SPC for ambulation. Her son comes by each day and assists her with IADL. She uses a WC and Teacher, early years/pre service to attend HD 3 days/wk. During today's evaluation, she requires Min-Mod A for supine<>sit, Mod A for LBD. Pt experiences significant decline in alertness, with impaired speech and motor movement. RN calls rapid response, pt's medications adjusted, neurology clears pt. Recommend ongoing OT services while pt is hospitalized. Given how far she is at present from her baseline level of fxl mobility and her considerable care needs, must recommend DC to SNF.  ? ?Recommendations for follow up therapy are one component of a multi-disciplinary discharge planning process, led by the attending physician.  Recommendations may be updated based on patient status, additional functional criteria and insurance authorization.  ? ?Follow Up Recommendations ? Skilled nursing-short term rehab (<3 hours/day)  ?  ?Assistance Recommended at Discharge Frequent or constant Supervision/Assistance  ?Patient can  return home with the following A lot of help with walking and/or transfers;A lot of help with bathing/dressing/bathroom;Assist for transportation;Assistance with cooking/housework ? ?  ?Functional Status Assessment ? Patient has had a recent decline in their functional status and demonstrates the ability to make significant improvements in function in a reasonable and predictable amount of time.  ?Equipment Recommendations ? None recommended by OT  ?  ?Recommendations for Other Services   ? ? ?  ?Precautions / Restrictions Precautions ?Precautions: Fall ?Precaution Comments: airborne and contact precautions ?Restrictions ?Weight Bearing Restrictions: No  ? ?  ? ?Mobility Bed Mobility ?Overal bed mobility: Needs Assistance ?Bed Mobility: Supine to Sit, Sit to Supine, Rolling ?Rolling: Supervision ?  ?Supine to sit: Min assist ?Sit to supine: Mod assist ?  ?General bed mobility comments: Assist for trunk elevation ?  ? ?Transfers ?Overall transfer level: Needs assistance ?  ?  ?  ?  ?  ?  ?  ?  ?General transfer comment: deferred. Anticipate Max A ?  ? ?  ?Balance Overall balance assessment: Needs assistance ?Sitting-balance support: Bilateral upper extremity supported, Feet supported ?Sitting balance-Leahy Scale: Fair ?  ?  ?  ?  ?Standing balance comment: deferred ?  ?  ?  ?  ?  ?  ?  ?  ?  ?  ?  ?   ? ?ADL either performed or assessed with clinical judgement  ? ?ADL Overall ADL's : Needs assistance/impaired ?  ?  ?Grooming: Wash/dry hands;Wash/dry face;Maximal assistance ?  ?  ?  ?  ?  ?  ?  ?Lower Body Dressing: Maximal assistance ?  Lower Body Dressing Details (indicate cue type and reason): donning socks ?  ?  ?  ?  ?  ?  ?  ?General ADL Comments: anticipate mod-max A for OOB mobility  ? ? ? ?Vision   ?   ?   ?Perception   ?  ?Praxis   ?  ? ?Pertinent Vitals/Pain Pain Assessment ?Pain Assessment: No/denies pain  ? ? ? ?Hand Dominance Left ?  ?Extremity/Trunk Assessment Upper Extremity Assessment ?Upper Extremity  Assessment: Generalized weakness ?  ?Lower Extremity Assessment ?Lower Extremity Assessment: Generalized weakness ?  ?  ?  ?Communication Communication ?Communication: No difficulties ?  ?Cognition Arousal/Alertness: Lethargic ?Behavior During Therapy: Agitated ?Overall Cognitive Status: No family/caregiver present to determine baseline cognitive functioning ?  ?  ?  ?  ?  ?  ?  ?  ?  ?  ?  ?  ?  ?  ?  ?  ?General Comments: Called rapid during session, due to pt's significant decline in level of responsiveness. ?  ?  ?General Comments    ? ?  ?Exercises Total Joint Exercises ?Ankle Circles/Pumps: AROM, 5 reps, Both, Supine ?Other Exercises ?Other Exercises: Educ re: POC. Cognitive reorientation, comfort post rapid ?  ?Shoulder Instructions    ? ? ?Home Living Family/patient expects to be discharged to:: Private residence ?Living Arrangements: Alone ?Available Help at Discharge: Family;Available PRN/intermittently ?Type of Home: Apartment ?Home Access: Level entry ?  ?  ?Home Layout: One level ?  ?  ?Bathroom Shower/Tub: Tub/shower unit ?  ?Bathroom Toilet: Standard ?Bathroom Accessibility: No ?  ?  ?  ?Additional Comments: Pt's son visits daily. Neighbor checks in frequently. Uses van service to get to HD. ?  ? ?  ?Prior Functioning/Environment Prior Level of Function : Needs assist ?  ?  ?  ?  ?  ?  ?Mobility Comments: Uses SPC or RW in apt, WC in community. ?ADLs Comments: Takes "bird baths." Sits on commode to don LB clothing. Son assists with IADL. ?  ? ?  ?  ?OT Problem List: Decreased strength;Impaired balance (sitting and/or standing);Decreased cognition;Decreased activity tolerance;Cardiopulmonary status limiting activity ?  ?   ?OT Treatment/Interventions: Self-care/ADL training;Therapeutic exercise;Patient/family education;Balance training;Energy conservation;DME and/or AE instruction;Cognitive remediation/compensation;Therapeutic activities  ?  ?OT Goals(Current goals can be found in the care plan  section) Acute Rehab OT Goals ?Patient Stated Goal: to have her son in room ?OT Goal Formulation: With patient ?Time For Goal Achievement: 12/12/21 ?Potential to Achieve Goals: Good ?ADL Goals ?Pt Will Perform Grooming: with supervision;sitting;standing (in sitting or standing, as able) ?Pt Will Perform Lower Body Dressing: with supervision;sit to/from stand;sitting/lateral leans ?Pt Will Transfer to Toilet: with modified independence (using LRAD) ?Pt Will Perform Toileting - Clothing Manipulation and hygiene: with modified independence;sitting/lateral leans  ?OT Frequency: Min 2X/week ?  ? ?Co-evaluation   ?  ?  ?  ?  ? ?  ?AM-PAC OT "6 Clicks" Daily Activity     ?Outcome Measure Help from another person eating meals?: A Little ?Help from another person taking care of personal grooming?: A Lot ?Help from another person toileting, which includes using toliet, bedpan, or urinal?: A Lot ?Help from another person bathing (including washing, rinsing, drying)?: A Lot ?Help from another person to put on and taking off regular upper body clothing?: A Lot ?Help from another person to put on and taking off regular lower body clothing?: A Lot ?6 Click Score: 13 ?  ?End of Session Equipment Utilized During Treatment: Rolling  walker (2 wheels) ?Nurse Communication: Mobility status ? ?Activity Tolerance: Treatment limited secondary to medical complications (Comment) ?Patient left: in bed;with call bell/phone within reach;with bed alarm set ? ?OT Visit Diagnosis: Unsteadiness on feet (R26.81);Muscle weakness (generalized) (M62.81)  ?              ?Time: 1355-1440 ?OT Time Calculation (min): 45 min ?Charges:  OT General Charges ?$OT Visit: 1 Visit ?OT Evaluation ?$OT Eval High Complexity: 1 High ?OT Treatments ?$Self Care/Home Management : 23-37 mins ?Josiah Lobo, PhD, MS, OTR/L ?11/28/21, 3:18 PM ? ?

## 2021-11-28 NOTE — Progress Notes (Signed)
OT Cancellation Note ? ?Patient Details ?Name: [name] ?MRN: [id number] ?DOB: Jan 19, 1961 ? ? ?Cancelled Treatment:    Reason Eval/Treat Not Completed: Fatigue/lethargy limiting ability to participate. Order received, chart reviewed. Per RN, pt is extremely lethargic and not appropriate for rehab services at this time. Will attempt at a later date/time, as pt is available, awake, and medically appropriate. ? ?Josiah Lobo, PhD, MS, OTR/L ?11/28/21, 11:18 AM ? ? ?

## 2021-11-29 DIAGNOSIS — J159 Unspecified bacterial pneumonia: Secondary | ICD-10-CM | POA: Diagnosis not present

## 2021-11-29 LAB — RENAL FUNCTION PANEL
Albumin: 2.8 g/dL — ABNORMAL LOW (ref 3.5–5.0)
Anion gap: 11 (ref 5–15)
BUN: 24 mg/dL — ABNORMAL HIGH (ref 8–23)
CO2: 27 mmol/L (ref 22–32)
Calcium: 8 mg/dL — ABNORMAL LOW (ref 8.9–10.3)
Chloride: 93 mmol/L — ABNORMAL LOW (ref 98–111)
Creatinine, Ser: 6.1 mg/dL — ABNORMAL HIGH (ref 0.44–1.00)
GFR, Estimated: 7 mL/min — ABNORMAL LOW (ref >60)
Glucose, Bld: 78 mg/dL (ref 70–99)
Phosphorus: 6.6 mg/dL — ABNORMAL HIGH (ref 2.5–4.6)
Potassium: 4.2 mmol/L (ref 3.5–5.1)
Sodium: 131 mmol/L — ABNORMAL LOW (ref 135–145)

## 2021-11-29 LAB — HEPATITIS B SURFACE ANTIBODY, QUANTITATIVE: Hep B S AB Quant (Post): >1000 m[IU]/mL (ref >9.9)

## 2021-11-29 LAB — CBC
HCT: 39.1 % (ref 36.0–46.0)
Hemoglobin: 12.3 g/dL (ref 12.0–15.0)
MCH: 27.8 pg (ref 26.0–34.0)
MCHC: 31.5 g/dL (ref 30.0–36.0)
MCV: 88.3 fL (ref 80.0–100.0)
Platelets: 282 10*3/uL (ref 150–400)
RBC: 4.43 MIL/uL (ref 3.87–5.11)
RDW: 16.5 % — ABNORMAL HIGH (ref 11.5–15.5)
WBC: 4.4 10*3/uL (ref 4.0–10.5)
nRBC: 0 % (ref 0.0–0.2)

## 2021-11-29 LAB — GLUCOSE, CAPILLARY
Glucose-Capillary: 214 mg/dL — ABNORMAL HIGH (ref 70–99)
Glucose-Capillary: 90 mg/dL (ref 70–99)
Glucose-Capillary: 166 mg/dL — ABNORMAL HIGH (ref 70–99)

## 2021-11-29 LAB — PROCALCITONIN: Procalcitonin: 0.45 ng/mL

## 2021-11-29 MED ORDER — VITAMIN B-12 1000 MCG PO TABS
1000.0000 ug | ORAL_TABLET | Freq: Every day | ORAL | Status: DC
  Administered 2021-11-29 – 2021-12-06 (×8): 1000 ug via ORAL
  Filled 2021-11-29 (×8): qty 1

## 2021-11-29 MED ORDER — HEPARIN SODIUM (PORCINE) 1000 UNIT/ML IJ SOLN
INTRAMUSCULAR | Status: AC
Start: 2021-11-29 — End: 2021-11-30
  Filled 2021-11-29: qty 10

## 2021-11-29 MED ORDER — NEPRO/CARBSTEADY PO LIQD
237.0000 mL | Freq: Two times a day (BID) | ORAL | Status: DC
  Administered 2021-11-30 – 2021-12-03 (×4): 237 mL via ORAL

## 2021-11-29 MED ORDER — AMLODIPINE BESYLATE 10 MG PO TABS
10.0000 mg | ORAL_TABLET | Freq: Every day | ORAL | Status: DC
  Administered 2021-11-29 – 2021-12-06 (×8): 10 mg via ORAL
  Filled 2021-11-29 (×8): qty 1

## 2021-11-29 MED ORDER — CALCIUM CARBONATE ANTACID 500 MG PO CHEW
500.0000 mg | CHEWABLE_TABLET | Freq: Four times a day (QID) | ORAL | Status: DC | PRN
  Administered 2021-11-29: 500 mg via ORAL
  Filled 2021-11-29 (×2): qty 3

## 2021-11-29 MED ORDER — ASPIRIN EC 81 MG PO TBEC
81.0000 mg | DELAYED_RELEASE_TABLET | Freq: Every day | ORAL | Status: DC
  Administered 2021-11-29 – 2021-12-06 (×8): 81 mg via ORAL
  Filled 2021-11-29 (×8): qty 1

## 2021-11-29 MED ORDER — ATORVASTATIN CALCIUM 20 MG PO TABS
20.0000 mg | ORAL_TABLET | Freq: Every evening | ORAL | Status: DC
  Administered 2021-11-29 – 2021-12-06 (×8): 20 mg via ORAL
  Filled 2021-11-29 (×8): qty 1

## 2021-11-29 MED ORDER — VITAMIN D 25 MCG (1000 UNIT) PO TABS
1000.0000 [IU] | ORAL_TABLET | Freq: Every day | ORAL | Status: DC
  Administered 2021-11-29 – 2021-12-06 (×8): 1000 [IU] via ORAL
  Filled 2021-11-29 (×8): qty 1

## 2021-11-29 MED ORDER — PANTOPRAZOLE SODIUM 40 MG PO TBEC
40.0000 mg | DELAYED_RELEASE_TABLET | Freq: Two times a day (BID) | ORAL | Status: DC
  Administered 2021-11-29 – 2021-12-06 (×16): 40 mg via ORAL
  Filled 2021-11-29 (×16): qty 1

## 2021-11-29 MED ORDER — NITROGLYCERIN 0.4 MG SL SUBL: 0.4000 mg | SUBLINGUAL_TABLET | SUBLINGUAL | Status: DC | PRN

## 2021-11-29 MED ORDER — SEVELAMER CARBONATE 800 MG PO TABS
800.0000 mg | ORAL_TABLET | Freq: Three times a day (TID) | ORAL | Status: DC
  Administered 2021-11-29 – 2021-12-06 (×30): 800 mg via ORAL
  Filled 2021-11-29 (×30): qty 1

## 2021-11-29 MED ORDER — MIRTAZAPINE 15 MG PO TABS
7.5000 mg | ORAL_TABLET | Freq: Every day | ORAL | Status: DC
  Administered 2021-11-29 – 2021-12-06 (×8): 7.5 mg via ORAL
  Filled 2021-11-29 (×8): qty 1

## 2021-11-29 MED ORDER — GABAPENTIN 100 MG PO CAPS
100.0000 mg | ORAL_CAPSULE | Freq: Every day | ORAL | Status: DC
  Administered 2021-11-29 – 2021-12-06 (×8): 100 mg via ORAL
  Filled 2021-11-29 (×8): qty 1

## 2021-11-29 MED ORDER — POLYETHYLENE GLYCOL 3350 17 G PO PACK
17.0000 g | PACK | Freq: Every day | ORAL | Status: DC | PRN
  Administered 2021-12-06: 17 g via ORAL
  Filled 2021-11-29: qty 1

## 2021-11-29 MED ORDER — FLUTICASONE PROPIONATE 50 MCG/ACT NA SUSP
2.0000 | Freq: Every day | NASAL | Status: DC | PRN
  Filled 2021-11-29: qty 16

## 2021-11-29 NOTE — Progress Notes (Signed)
?   11/29/21 2100  ?Clinical Encounter Type  ?Visited With Patient not available  ?Visit Type Initial  ?Referral From Nurse  ?Consult/Referral To Chaplain  ? ?Chaplain responded to nurse consult. Patient would like to speak with a Chaplain in the morning as she is trying to rest for the evening. Chaplain will have on call Chaplain follow up tomorrow AM. ?

## 2021-11-29 NOTE — Evaluation (Signed)
Physical Therapy Evaluation ?Patient Details ?Name: [name] ?MRN: [id number] ?DOB: [birth year] ?Today's Date: 11/29/2021 ? ?History of Present Illness ? [name] is a 61 y.o. female with medical history significant for ESRD on HD TTS, DM, chronic diastolic heart failure, CAD, PVD, Chronic respiratory failure on home O2 at 2 L, who presents to the ED with a 7-10 day history of cough, chills, weakness, myalgia; admitted for management of PNA due to COVID-19.  Hospital course significant for RR call due to episode of decreased responsiveness; relieved with Narcan (after which opioids discontinued).  ?Clinical Impression ? Patient supine in bed upon arrival to room; physician completing daily rounds.  Patient intermittently lethargic, consistently moaning, grimacing in pain (FACES 8/10), but unable to articulate, localize or describe.  Oriented to self only; limited ability to follow simple commands.  Notably HOH.  Globally weak and deconditioned throughout all extremities, requiring act assist from therapist to facilitate movement or repositioning of all extremities.  Dep assist for scooting up, repositioning in bed.  Unsafe/unable to attempt additional mobility or OOB efforts at this time.  Will continue to assess/progress as medically appropriate and able to tolerate. ?Of note, sats >93% on supplemental O2 throughout session. ?Would benefit from skilled PT to address above deficits and promote optimal return to PLOF.; recommend transition to STR upon discharge from acute hospitalization. ? ?   ? ?Recommendations for follow up therapy are one component of a multi-disciplinary discharge planning process, led by the attending physician.  Recommendations may be updated based on patient status, additional functional criteria and insurance authorization. ? ?Follow Up Recommendations Skilled nursing-short term rehab (<3 hours/day) ? ?  ?Assistance Recommended at Discharge Frequent or constant Supervision/Assistance   ?Patient can return home with the following ? Help with stairs or ramp for entrance;Direct supervision/assist for medications management;Assist for transportation;Direct supervision/assist for financial management;Two people to help with walking and/or transfers;Two people to help with bathing/dressing/bathroom ? ?  ?Equipment Recommendations    ?Recommendations for Other Services ?    ?  ?Functional Status Assessment Patient has had a recent decline in their functional status and demonstrates the ability to make significant improvements in function in a reasonable and predictable amount of time.  ? ?  ?Precautions / Restrictions Precautions ?Precautions: Fall ?Precaution Comments: R chest perm-cath ?Restrictions ?Weight Bearing Restrictions: No  ? ?  ? ?Mobility ? Bed Mobility ?  ?Bed Mobility: Rolling (scooting up, repositioning) ?Rolling: Total assist ?  ?  ?  ?  ?General bed mobility comments: minimal/no ability to actively assist with repositioning and movement in bed ?  ? ?Transfers ?  ?  ?  ?  ?  ?  ?  ?  ?  ?General transfer comment: unsafe/unable ?  ? ?Ambulation/Gait ?  ?  ?  ?  ?  ?  ?  ?General Gait Details: unsafe/unable ? ?Stairs ?  ?  ?  ?  ?  ? ?Wheelchair Mobility ?  ? ?Modified Rankin (Stroke Patients Only) ?  ? ?  ? ?Balance   ?  ?  ?  ?  ?  ?  ?  ?  ?  ?  ?  ?  ?  ?  ?  ?  ?  ?  ?   ? ? ? ?Pertinent Vitals/Pain Pain Assessment ?Pain Assessment: Faces ?Faces Pain Scale: Hurts whole lot ?Pain Location: unable to state, appears generalized throughout body ?Pain Descriptors / Indicators: Grimacing, Guarding, Moaning ?Pain Intervention(s): Limited activity within  patient's tolerance, Monitored during session, Repositioned  ? ? ?Home Living Family/patient expects to be discharged to:: Private residence ?Living Arrangements: Alone ?Available Help at Discharge: Family;Available PRN/intermittently ?Type of Home: Apartment ?Home Access: Level entry ?  ?  ?  ?Home Layout: One level ?  ?Additional Comments:  *All social history taken from chart/previous documentation; patient unable to verbalize/provide information.  Pt's son visits daily. Neighbor checks in frequently. Uses van service to get to HD.  ?  ?Prior Function Prior Level of Function : Needs assist ?  ?  ?  ?  ?  ?  ?Mobility Comments: *All social history taken from chart/previous documentation; patient unable to verbalize/provide information.  Uses SPC or RW in apt, WC in community. ?ADLs Comments: Sponge bathes for ADLs ?  ? ? ?Hand Dominance  ?   ? ?  ?Extremity/Trunk Assessment  ? Upper Extremity Assessment ?Upper Extremity Assessment: Generalized weakness (grossly at least 2+/5 throughout bilat UEs; globally weak and deconditioned) ?  ? ?Lower Extremity Assessment ?Lower Extremity Assessment: Generalized weakness (grossly at least 2+/5 throughout bilat UEs; globally weak and deconditioned) ?  ? ?   ?Communication  ? Communication:  (speech generally garbled and non-sensical; unable to effectively maintain conversation, answer questions or follow simple commands)  ?Cognition Arousal/Alertness: Lethargic ?Behavior During Therapy: Flat affect, Restless ?Overall Cognitive Status: No family/caregiver present to determine baseline cognitive functioning ?  ?  ?  ?  ?  ?  ?  ?  ?  ?  ?  ?  ?  ?  ?  ?  ?  ?  ?  ? ?  ?General Comments   ? ?  ?Exercises Other Exercises ?Other Exercises: Supine UE therex, 1x5, and LE therex, 1x8, act assist/passive ROM for muscular strength and flexibility.  Patient moaning, grimacing throughout; indicating generalized pain/soreness. Unable to fully articulate.  Repositioned to comfort with bilat heels floated  ? ?Assessment/Plan  ?  ?PT Assessment Patient needs continued PT services  ?PT Problem List Decreased strength;Decreased range of motion;Decreased balance;Decreased mobility;Decreased coordination;Decreased cognition;Decreased activity tolerance;Decreased knowledge of use of DME;Decreased safety awareness;Decreased knowledge  of precautions;Cardiopulmonary status limiting activity;Pain ? ?   ?  ?PT Treatment Interventions DME instruction;Gait training;Functional mobility training;Therapeutic activities;Therapeutic exercise;Balance training;Cognitive remediation;Patient/family education   ? ?PT Goals (Current goals can be found in the Care Plan section)  ?Acute Rehab PT Goals ?PT Goal Formulation: Patient unable to participate in goal setting ?Time For Goal Achievement: 12/13/21 ?Potential to Achieve Goals: Fair ? ?  ?Frequency Min 2X/week ?  ? ? ?Co-evaluation   ?  ?  ?  ?  ? ? ?  ?AM-PAC PT "6 Clicks" Mobility  ?Outcome Measure Help needed turning from your back to your side while in a flat bed without using bedrails?: Total ?Help needed moving from lying on your back to sitting on the side of a flat bed without using bedrails?: Total ?Help needed moving to and from a bed to a chair (including a wheelchair)?: Total ?Help needed standing up from a chair using your arms (e.g., wheelchair or bedside chair)?: Total ?Help needed to walk in hospital room?: Total ?Help needed climbing 3-5 steps with a railing? : Total ?6 Click Score: 6 ? ?  ?End of Session   ?Activity Tolerance: Patient limited by pain ?Patient left: in bed;with call bell/phone within reach;with bed alarm set ?Nurse Communication: Mobility status ?PT Visit Diagnosis: Muscle weakness (generalized) (M62.81);Difficulty in walking, not elsewhere classified (R26.2);Pain ?  ? ?Time:  6314-9702 ?PT Time Calculation (min) (ACUTE ONLY): 19 min ? ? ?Charges:   PT Evaluation ?$PT Eval Moderate Complexity: 1 Mod ?PT Treatments ?$Therapeutic Exercise: 8-22 mins ?  ?   ? ? ?Shaynah Hund H. Owens Shark, PT, DPT, NCS ?11/29/21, 2:58 PM ?[phone] ? ? ?

## 2021-11-29 NOTE — Progress Notes (Signed)
Patient completes 2.5 HD treatment without incident. Right subclavian catheter intact, functions well, maintains prescribed BFR. Targeted UF reached with 1-liter fluid removal. Patient emotional during treatment, but awoke at end of treatment feeling better, inquiring about physical therapy. Patient returned to room with mask on, report given.  ?

## 2021-11-29 NOTE — Progress Notes (Signed)
[facility]  ROUNDING NOTE   Subjective:   [name] is a 61 year old female with past medical history including diabetes, chronic diastolic heart failure, PVD, CAD, chronic respiratory failure with 2 L oxygen used at home, and end-stage renal disease on hemodialysis.  Patient presents to the emergency department with complaints of diarrhea, nausea, weakness and chills.  Patient has been admitted for under observation for Myalgia [M79.10] Pneumonia [J18.9] Community acquired pneumonia of left lower lobe of lung [J18.9] Bacterial pneumonia [J15.9]  Patient is known to our practice and receives outpatient dialysis at [facility] on a MWF schedule, supervised by Dr Candiss Norse.   Patient seen resting quietly Drowsy this morning Complains of chills, afebrile  Dialysis planned for later today  Objective:  Vital signs in last 24 hours:  Temp:  [97.6 F (36.4 C)-98.6 F (37 C)] 98.5 F (36.9 C) (03/10 0833) Pulse Rate:  [63-75] 63 (03/10 1130) Resp:  [14-18] 14 (03/10 1130) BP: (128-155)/(56-88) 141/88 (03/10 0833) SpO2:  [94 %-100 %] 94 % (03/10 1130)  Weight change:  Filed Weights   11/27/21 1524 11/27/21 1638 11/27/21 1940  Weight: 74.5 kg 74.5 kg 74.5 kg    Intake/Output: I/O last 3 completed shifts: In: 350 [IV Piggyback:350] Out: 0    Intake/Output this shift:  No intake/output data recorded.  Physical Exam: General: NAD, ill-appearing  Head: Normocephalic, atraumatic. Moist oral mucosal membranes  Eyes: Anicteric  Lungs:  Rales normal effort, 2 L [place] O2  Heart: Regular rate and rhythm  Abdomen:  Soft, nontender  Extremities: Trace peripheral edema, facial edema  Neurologic: Nonfocal, moving all four extremities  Skin: No lesions  Access: Right PermCath    Basic Metabolic Panel: Recent Labs  Lab 11/27/21 0354  NA 129*  K 4.7  CL 89*  CO2 25  GLUCOSE 99  BUN 33*  CREATININE 6.37*  CALCIUM 7.9*  MG 1.9     Liver Function Tests: Recent  Labs  Lab 11/27/21 0354  AST 12*  ALT 8  ALKPHOS 103  BILITOT 0.7  PROT 6.8  ALBUMIN 3.2*    No results for input(s): LIPASE, AMYLASE in the last 168 hours. No results for input(s): AMMONIA in the last 168 hours.  CBC: Recent Labs  Lab 11/27/21 0354  WBC 7.5  NEUTROABS 4.3  HGB 12.1  HCT 39.5  MCV 88.6  PLT 319     Cardiac Enzymes: No results for input(s): CKTOTAL, CKMB, CKMBINDEX, TROPONINI in the last 168 hours.  BNP: Invalid input(s): POCBNP  CBG: Recent Labs  Lab 11/28/21 1716 11/28/21 1842 11/28/21 2233 11/29/21 0309 11/29/21 1229  GLUCAP 74 114* 140* 214* 90     Microbiology: Results for orders placed or performed during the hospital encounter of 11/27/21  Resp Panel by RT-PCR (Flu A&B, Covid) Nasopharyngeal Swab     Status: Abnormal   Collection Time: 11/27/21  3:54 AM   Specimen: Nasopharyngeal Swab; Nasopharyngeal(NP) swabs in vial transport medium  Result Value Ref Range Status   SARS Coronavirus 2 by RT PCR POSITIVE (A) NEGATIVE Final    Comment: (NOTE) SARS-CoV-2 target nucleic acids are DETECTED.  The SARS-CoV-2 RNA is generally detectable in upper respiratory specimens during the acute phase of infection. Positive results are indicative of the presence of the identified virus, but do not rule out bacterial infection or co-infection with other pathogens not detected by the test. Clinical correlation with patient history and other diagnostic information is necessary to determine patient infection status. The expected  result is Negative.  Fact Sheet for Patients: EntrepreneurPulse.com.au  Fact Sheet for Healthcare Providers: IncredibleEmployment.be  This test is not yet approved or cleared by the [place] FDA and  has been authorized for detection and/or diagnosis of SARS-CoV-2 by FDA under an Emergency Use Authorization (EUA).  This EUA will remain in effect (meaning this test can be used) for  the duration of  the COVID-19 declaration under Section 564(b)(1) of the A ct, 21 U.S.C. section 360bbb-3(b)(1), unless the authorization is terminated or revoked sooner.     Influenza A by PCR NEGATIVE NEGATIVE Final   Influenza B by PCR NEGATIVE NEGATIVE Final    Comment: (NOTE) The Xpert Xpress SARS-CoV-2/FLU/RSV plus assay is intended as an aid in the diagnosis of influenza from Nasopharyngeal swab specimens and should not be used as a sole basis for treatment. Nasal washings and aspirates are unacceptable for Xpert Xpress SARS-CoV-2/FLU/RSV testing.  Fact Sheet for Patients: EntrepreneurPulse.com.au  Fact Sheet for Healthcare Providers: IncredibleEmployment.be  This test is not yet approved or cleared by the [place] FDA and has been authorized for detection and/or diagnosis of SARS-CoV-2 by FDA under an Emergency Use Authorization (EUA). This EUA will remain in effect (meaning this test can be used) for the duration of the COVID-19 declaration under Section 564(b)(1) of the Act, 21 U.S.C. section 360bbb-3(b)(1), unless the authorization is terminated or revoked.  Performed at [facility], [address] [place], [place] [address]   Blood culture (single)     Status: None (Preliminary result)   Collection Time: 11/27/21  5:04 AM   Specimen: BLOOD  Result Value Ref Range Status   Specimen Description BLOOD LEFT FA  Final   Special Requests   Final    BOTTLES DRAWN AEROBIC AND ANAEROBIC Blood Culture adequate volume   Culture   Final    NO GROWTH 2 DAYS Performed at [facility], [address] [place], [place] [address]    Report Status PENDING  Incomplete  Expectorated Sputum Assessment w Gram Stain, Rflx to Resp Cult     Status: None   Collection Time: 11/27/21  6:57 AM   Specimen: Expectorated Sputum  Result Value Ref Range Status   Specimen Description EXPECTORATED SPUTUM  Final   Special Requests  NONE  Final   Sputum evaluation   Final    THIS SPECIMEN IS ACCEPTABLE FOR SPUTUM CULTURE Performed at [facility], [address] [place], [place] [address]    Report Status 11/27/2021 FINAL  Final  Culture, Respiratory w Gram Stain     Status: None (Preliminary result)   Collection Time: 11/27/21  6:57 AM  Result Value Ref Range Status   Specimen Description   Final    EXPECTORATED SPUTUM Performed at [facility], [address] [place], [place] [address]    Special Requests   Final    NONE Reflexed from [id number] Performed at [facility], [address] [place], [place] [address]    Gram Stain   Final    MODERATE SQUAMOUS EPITHELIAL CELLS PRESENT FEW WBC PRESENT,BOTH PMN AND MONONUCLEAR FEW BUDDING YEAST SEEN FEW GRAM POSITIVE COCCI FEW GRAM NEGATIVE RODS    Culture   Final    ABUNDANT ESCHERICHIA COLI SUSCEPTIBILITIES TO FOLLOW REPEATING Performed at [facility], [address], [place], [place] [address]    Report Status PENDING  Incomplete    Coagulation Studies: No results for input(s): LABPROT, INR in the last 72 hours.  Urinalysis: No results for input(s): COLORURINE, LABSPEC, PHURINE, GLUCOSEU, HGBUR, BILIRUBINUR, KETONESUR, PROTEINUR, UROBILINOGEN, NITRITE, LEUKOCYTESUR in the last 72 hours.  Invalid input(s): APPERANCEUR    Imaging: No results found.   Medications:    azithromycin (ZITHROMAX) 500 MG IVPB (Vial-Mate Adaptor) 500 mg (11/29/21 1107)   cefTRIAXone (ROCEPHIN)  IV 1 g (11/29/21 0937)    albuterol  2 puff Inhalation Q6H   amLODipine  10 mg Oral Daily   aspirin EC  81 mg Oral Daily   atorvastatin  20 mg Oral QPM   Chlorhexidine Gluconate Cloth  6 each Topical Q0600   cholecalciferol  1,000 Units Oral Daily   feeding supplement  1 Container Oral TID BM   feeding supplement (NEPRO CARB STEADY)  237 mL Oral BID BM   gabapentin  100 mg Oral QHS   heparin  5,000 Units Subcutaneous Q8H   mirtazapine   7.5 mg Oral QHS   multivitamin  1 tablet Oral QHS   pantoprazole  40 mg Oral BID   sevelamer carbonate  800 mg Oral TID WC & HS   cyanocobalamin  1,000 mcg Oral Daily   acetaminophen, calcium carbonate, fluticasone, guaiFENesin-dextromethorphan, naLOXone (NARCAN)  injection, nitroGLYCERIN, polyethylene glycol, promethazine  Assessment/ Plan:  [name] is a 61 y.o.  female past medical history including diabetes, chronic diastolic heart failure, PVD, CAD, chronic respiratory failure with 2 L oxygen used at home, and end-stage renal disease on hemodialysis.  Patient presents to the emergency department with complaints of diarrhea, nausea, weakness and chills.  Patient has been admitted for under observation for Myalgia [M79.10] Pneumonia [J18.9] Community acquired pneumonia of left lower lobe of lung [J18.9] Bacterial pneumonia [J15.9]  [facility]/MWF/right PermCath/73 kg  1.  End-stage renal disease requiring hemodialysis.    Scheduled to receive dialysis later today.  We will adjust UF goal to 1 L as tolerated due to increased facial edema.  2. Anemia of chronic kidney disease  Normocytic Lab Results  Component Value Date   HGB 12.1 11/27/2021   Mircera received outpatient every 2 weeks.  3. .Secondary Hyperparathyroidism:  Lab Results  Component Value Date   PTH 357 (H) 10/05/2018   CALCIUM 7.9 (L) 11/27/2021   CAION 1.09 (L) 04/13/2020   PHOS 3.8 05/10/2021    Calcitriol received outpatient Awaiting updated labs with dialysis  4. Community-acquired pneumonia due to COVID-19 virus.  Patient received Rocephin and azithromycin in ED. Antibiotics continued. Providing supportive care such as antitussives, inhalers, supplemental oxygen.   LOS: 1   3/10/20231:00 PM

## 2021-11-29 NOTE — Progress Notes (Signed)
PROGRESS NOTE   [name]  HQP:[id number]    DOB: 1961-08-04    DOA: 11/27/2021  PCP: Denton Lank, MD   I have briefly reviewed patients previous medical records in [facility].  Chief Complaint  Patient presents with   Generalized Body Aches    Hospital Course:  61 year old female with medical history significant for ESRD on TTS HD, type II DM, chronic diastolic CHF, CAD, PAD, chronic respiratory failure on home oxygen 2 L/min, COPD, anemia, HLD, GERD, hard of hearing, presented to the ED with 7 to 10 days of cough, chills, weakness and myalgia.  Reported testing positive for COVID 19 at the dialysis center more than a week ago.  Denies dyspnea or chest pain.  Developed self-limiting diarrhea 3 days PTA without nausea, vomiting or abdominal pain.  Admitted for bacterial pneumonia superimposed on recent COVID-19 infection.  Nephrology consulted for HD needs.  On 3/9, patient obtunded, rapid response initiated, improved after a dose of Narcan, opioids discontinued.   Assessment & Plan:  Principal Problem:   Bacterial pneumonia Active Problems:   Unresponsiveness   COVID-19 virus infection   Chronic respiratory failure with hypoxia (HCC)   End-stage renal disease on hemodialysis (HCC)   Hyponatremia   HTN (hypertension)   Diabetes mellitus, type II (HCC)   Chronic diastolic CHF (congestive heart failure) (HCC)   Coronary artery disease involving native coronary artery of native heart without angina pectoris   Assessment and Plan: * Bacterial pneumonia Based on symptomatology including productive cough and chest x-ray findings. Procalcitonin elevated but unclear significance in the context of ESRD patient. Continue empirically started IV ceftriaxone and azithromycin, day 3 today. Blood cultures negative to date.  Sputum culture showed E. coli, sensitivities pending. Ongoing supportive care and aggressive pulmonary toilet.  Strict aspiration  precautions.  Unresponsiveness Acute toxic metabolic encephalopathy On 3/9, RN alerted that patient was barely arousable, respiratory rate of 10. Ordered Narcan, ABG and advised to activate rapid response team. Improved after dose of Narcan. Likely related to Vicodin, Tussionex complicating dialysis patient. Discontinued all opioids and minimize use of same. Although patient is alert, difficult to gauge orientation.  Hard of hearing despite speaking loudly.  Also seem to be having difficulty in getting patient cooperation to answer questions.  COVID-19 virus infection Per patient report, tested positive for COVID-19 at the outpatient dialysis center more than a week ago. Hypoxia no worse than her baseline and requiring 2 L/min [place] oxygen. Discussed in detail with pharmacy, given duration of her COVID infection, no antivirals initiated.  No indication for steroids in the absence of worsening hypoxia. Supportive treatment.  Chronic respiratory failure with hypoxia (HCC) 2 L/min O2 requirement at baseline. Appears to be at baseline.  Unclear why she is on 4 L well saturating in the high 90s.  Needs to be titrated down to her home level. ABG this afternoon during unresponsive episode, pH 7.35, PCO2 52, PO2 108 and oxygen saturation 98.9  End-stage renal disease on hemodialysis [facility]) Nephrology consulted for HD needs.  Hyponatremia Sodium 129 but at baseline Her baseline and likely related to ESRD and HD. Management per nephrology.  HTN (hypertension) Continue amlodipine, hydralazine Controlled.  Diabetes mellitus, type II ([place]) Had some hypoglycemic episodes and CBGs monitored closely, no further episodes. Not on insulins.  No further CBG checks. A1c well controlled at 6.2 on 5/99/3570  Chronic diastolic CHF (congestive heart failure) ([place]) Compensated.  Continue furosemide. Volume management across HD.  Coronary artery disease involving  native coronary artery of native heart  without angina pectoris Continue aspirin and atorvastatin with nitroglycerin as needed chest pain   Body mass index is 31.29 kg/m.     DVT prophylaxis: heparin injection 5,000 Units Start: 11/27/21 0600     Code Status: Full Code:  Family Communication: Discussed with son, updated care and answered questions Disposition:  Status is: inpatient.    Consultants:   Nephrology  Procedures:     Antimicrobials:   As above   Subjective:  Seen this morning along with PT in the room.  Patient intermittently moaning.  Appeared alert but lethargic.  Extremely hard of hearing.  Reports diffuse pain.  As per RN, had BM this morning.  No vomiting reported.  Objective:   Vitals:   11/29/21 1600 11/29/21 1615 11/29/21 1630 11/29/21 1637  BP: (!) 164/44 (!) 170/58 (!) 164/62   Pulse: 64 62 62 60  Resp: (!) 8 (!) 2 (!) 6 (!) 8  Temp:      TempSrc:      SpO2: 98% 99% 99% 99%  Weight:      Height:        General exam: Middle-age female, moderately built and nourished lying propped up in bed, no respiratory distress, appears somewhat uncomfortable due to pain, moaning intermittently Respiratory system: Apart from occasional basal crackles, clear to auscultation.  No increased work of breathing.  Less wet cough than 2 days ago. Cardiovascular system: S1 & S2 heard, RRR. No JVD, murmurs, rubs, gallops or clicks. No pedal edema.  Telemetry personally reviewed: Sinus rhythm. Gastrointestinal system: Abdomen is nondistended, soft and nontender. No organomegaly or masses felt. Normal bowel sounds heard. Central nervous system: Alert and oriented at least to self.  Does answer a few questions appropriately after being repeatedly asked.  No focal deficits. Extremities: Symmetric 5 x 5 power. Skin: No rashes, lesions or ulcers Psychiatry: Judgement and insight impaired.  Mood & affect difficult to assess.    Data Reviewed:   I have personally reviewed following labs and imaging  studies   CBC: Recent Labs  Lab 11/27/21 0354 11/29/21 1405  WBC 7.5 4.4  NEUTROABS 4.3  --   HGB 12.1 12.3  HCT 39.5 39.1  MCV 88.6 88.3  PLT 319 314    Basic Metabolic Panel: Recent Labs  Lab 11/27/21 0354 11/29/21 1405  NA 129* 131*  K 4.7 4.2  CL 89* 93*  CO2 25 27  GLUCOSE 99 78  BUN 33* 24*  CREATININE 6.37* 6.10*  CALCIUM 7.9* 8.0*  MG 1.9  --   PHOS  --  6.6*    Liver Function Tests: Recent Labs  Lab 11/27/21 0354 11/29/21 1405  AST 12*  --   ALT 8  --   ALKPHOS 103  --   BILITOT 0.7  --   PROT 6.8  --   ALBUMIN 3.2* 2.8*    CBG: Recent Labs  Lab 11/28/21 2233 11/29/21 0309 11/29/21 1229  GLUCAP 140* 214* 90    Microbiology Studies:   Recent Results (from the past 240 hour(s))  Resp Panel by RT-PCR (Flu A&B, Covid) Nasopharyngeal Swab     Status: Abnormal   Collection Time: 11/27/21  3:54 AM   Specimen: Nasopharyngeal Swab; Nasopharyngeal(NP) swabs in vial transport medium  Result Value Ref Range Status   SARS Coronavirus 2 by RT PCR POSITIVE (A) NEGATIVE Final    Comment: (NOTE) SARS-CoV-2 target nucleic acids are DETECTED.  The SARS-CoV-2 RNA is generally detectable  in upper respiratory specimens during the acute phase of infection. Positive results are indicative of the presence of the identified virus, but do not rule out bacterial infection or co-infection with other pathogens not detected by the test. Clinical correlation with patient history and other diagnostic information is necessary to determine patient infection status. The expected result is Negative.  Fact Sheet for Patients: EntrepreneurPulse.com.au  Fact Sheet for Healthcare Providers: IncredibleEmployment.be  This test is not yet approved or cleared by the [place] FDA and  has been authorized for detection and/or diagnosis of SARS-CoV-2 by FDA under an Emergency Use Authorization (EUA).  This EUA will remain in effect  (meaning this test can be used) for the duration of  the COVID-19 declaration under Section 564(b)(1) of the A ct, 21 U.S.C. section 360bbb-3(b)(1), unless the authorization is terminated or revoked sooner.     Influenza A by PCR NEGATIVE NEGATIVE Final   Influenza B by PCR NEGATIVE NEGATIVE Final    Comment: (NOTE) The Xpert Xpress SARS-CoV-2/FLU/RSV plus assay is intended as an aid in the diagnosis of influenza from Nasopharyngeal swab specimens and should not be used as a sole basis for treatment. Nasal washings and aspirates are unacceptable for Xpert Xpress SARS-CoV-2/FLU/RSV testing.  Fact Sheet for Patients: EntrepreneurPulse.com.au  Fact Sheet for Healthcare Providers: IncredibleEmployment.be  This test is not yet approved or cleared by the [place] FDA and has been authorized for detection and/or diagnosis of SARS-CoV-2 by FDA under an Emergency Use Authorization (EUA). This EUA will remain in effect (meaning this test can be used) for the duration of the COVID-19 declaration under Section 564(b)(1) of the Act, 21 U.S.C. section 360bbb-3(b)(1), unless the authorization is terminated or revoked.  Performed at [facility], [address] [place], [place] [address]   Blood culture (single)     Status: None (Preliminary result)   Collection Time: 11/27/21  5:04 AM   Specimen: BLOOD  Result Value Ref Range Status   Specimen Description BLOOD LEFT FA  Final   Special Requests   Final    BOTTLES DRAWN AEROBIC AND ANAEROBIC Blood Culture adequate volume   Culture   Final    NO GROWTH 2 DAYS Performed at [facility], [address], [place], [place] [address]    Report Status PENDING  Incomplete  Expectorated Sputum Assessment w Gram Stain, Rflx to Resp Cult     Status: None   Collection Time: 11/27/21  6:57 AM   Specimen: Expectorated Sputum  Result Value Ref Range Status   Specimen Description  EXPECTORATED SPUTUM  Final   Special Requests NONE  Final   Sputum evaluation   Final    THIS SPECIMEN IS ACCEPTABLE FOR SPUTUM CULTURE Performed at [facility], [address] [place], [place] [address]    Report Status 11/27/2021 FINAL  Final  Culture, Respiratory w Gram Stain     Status: None (Preliminary result)   Collection Time: 11/27/21  6:57 AM  Result Value Ref Range Status   Specimen Description   Final    EXPECTORATED SPUTUM Performed at [facility], [address], [place], [place] [address]    Special Requests   Final    NONE Reflexed from [id number] Performed at [facility], [address] [place],  [address]    Gram Stain   Final    MODERATE SQUAMOUS EPITHELIAL CELLS PRESENT FEW WBC PRESENT,BOTH PMN AND MONONUCLEAR FEW BUDDING YEAST SEEN FEW GRAM POSITIVE COCCI FEW [place] Grandchild  NEGATIVE RODS    Culture   Final    ABUNDANT ESCHERICHIA COLI SUSCEPTIBILITIES TO FOLLOW REPEATING Performed at [facility], [place] [address], [place], [place] [address]    Report Status PENDING  Incomplete    Radiology Studies:  No results found.  Scheduled Meds:    albuterol  2 puff Inhalation Q6H   amLODipine  10 mg Oral Daily   aspirin EC  81 mg Oral Daily   atorvastatin  20 mg Oral QPM   Chlorhexidine Gluconate Cloth  6 each Topical Q0600   cholecalciferol  1,000 Units Oral Daily   feeding supplement  1 Container Oral TID BM   feeding supplement (NEPRO CARB STEADY)  237 mL Oral BID BM   gabapentin  100 mg Oral QHS   heparin  5,000 Units Subcutaneous Q8H   heparin sodium (porcine)       mirtazapine  7.5 mg Oral QHS   multivitamin  1 tablet Oral QHS   pantoprazole  40 mg Oral BID   sevelamer carbonate  800 mg Oral TID WC & HS   cyanocobalamin  1,000 mcg Oral Daily    Continuous Infusions:    azithromycin (ZITHROMAX) 500 MG IVPB (Vial-Mate Adaptor) 500 mg (11/29/21 1107)   cefTRIAXone (ROCEPHIN)  IV 1 g (11/29/21 6073)     LOS: 1  day     Vernell Leep, MD,  FACP, [facility], [facility], [facility] (Care Management Physician Certified) [place]  To contact the attending provider between 7A-7P or the covering provider during after hours 7P-7A, please log into the web site [email or web address] and access using universal [place] password for that web site. If you do not have the password, please call the hospital operator.  11/29/2021, 4:50 PM

## 2021-11-30 DIAGNOSIS — J159 Unspecified bacterial pneumonia: Secondary | ICD-10-CM | POA: Diagnosis not present

## 2021-11-30 LAB — CULTURE, RESPIRATORY W GRAM STAIN

## 2021-11-30 LAB — GLUCOSE, CAPILLARY
Glucose-Capillary: 74 mg/dL (ref 70–99)
Glucose-Capillary: 213 mg/dL — ABNORMAL HIGH (ref 70–99)

## 2021-11-30 MED ORDER — AZITHROMYCIN 250 MG PO TABS
500.0000 mg | ORAL_TABLET | Freq: Every day | ORAL | Status: DC
  Administered 2021-11-30: 500 mg via ORAL
  Filled 2021-11-30: qty 2

## 2021-11-30 NOTE — Progress Notes (Signed)
?  Chaplain On-Call responded to [place] referral from [facility]. ? ?The patient's request is for prayer. ? ?At 1145 hours, Chaplain donned PPE and attempted to visit with the patient. ? ?Patient declined visit at this time. ? ?Chaplain will be available for additional support if requested. ? ?Chaplain Pollyann Samples ?M.Div., [facility] ?

## 2021-11-30 NOTE — Progress Notes (Addendum)
PROGRESS NOTE   [name]  TWS:[id number]    DOB: 20-Mar-1961    DOA: 11/27/2021  PCP: Denton Lank, MD   I have briefly reviewed patients previous medical records in [facility].  Chief Complaint  Patient presents with   Generalized Body Aches    Hospital Course:  61 year old female with medical history significant for ESRD on TTS HD, type II DM, chronic diastolic CHF, CAD, PAD, chronic respiratory failure on home oxygen 2 L/min, COPD, anemia, HLD, GERD, hard of hearing, presented to the ED with 7 to 10 days of cough, chills, weakness and myalgia.  Reported testing positive for COVID 19 at the dialysis center more than a week ago.  Denies dyspnea or chest pain.  Developed self-limiting diarrhea 3 days PTA without nausea, vomiting or abdominal pain.  Admitted for bacterial pneumonia superimposed on recent COVID-19 infection.  Nephrology consulted for HD needs.  On 3/9, patient obtunded, rapid response initiated, improved after a dose of Narcan, opioids discontinued.  Slowly improving.  Hopeful DC tomorrow but PT recommending SNF which may delay it until next week.   Assessment & Plan:  Principal Problem:   Bacterial pneumonia Active Problems:   Unresponsiveness   COVID-19 virus infection   Chronic respiratory failure with hypoxia (HCC)   End-stage renal disease on hemodialysis (HCC)   Hyponatremia   HTN (hypertension)   Diabetes mellitus, type II (HCC)   Chronic diastolic CHF (congestive heart failure) (HCC)   Coronary artery disease involving native coronary artery of native heart without angina pectoris   Physical debility   Assessment and Plan: * Bacterial pneumonia Based on symptomatology including productive cough and chest x-ray findings. Procalcitonin elevated but unclear significance in the context of ESRD patient. Continue empirically started IV ceftriaxone and azithromycin, complete total 5 days Blood cultures negative to date.  Sputum culture showed E. coli,  sensitivities pending.   As per communication with pharmacy, patient has clinically improved despite current antibiotics not covering for E. coli and hence completed ceftriaxone.  Option for treating E. coli would be Bactrim, not appropriate in dialysis patient Ongoing supportive care and aggressive pulmonary toilet.  Strict aspiration precautions.  Unresponsiveness Acute toxic metabolic encephalopathy On 3/9, RN alerted that patient was barely arousable, respiratory rate of 10. Ordered Narcan, ABG and advised to activate rapid response team. Improved after dose of Narcan. Likely related to Vicodin, Tussionex complicating dialysis patient. Discontinued all opioids and minimize use of same. Patient awake, alert and oriented today. Resolved.  COVID-19 virus infection Per patient report, tested positive for COVID-19 at the outpatient dialysis center more than a week ago. Hypoxia no worse than her baseline and requiring 2 L/min [place] oxygen. Discussed in detail with pharmacy, given duration of her COVID infection, no antivirals initiated.  No indication for steroids in the absence of worsening hypoxia. Supportive treatment.  Chronic respiratory failure with hypoxia (HCC) 2 L/min O2 requirement at baseline. Appears to be at baseline.  Unclear why she is on 4 L well saturating in the high 90s.  Needs to be titrated down to her home level. ABG this afternoon during unresponsive episode, pH 7.35, PCO2 52, PO2 108 and oxygen saturation 98.9  End-stage renal disease on hemodialysis [facility]) Nephrology consulted for HD needs.  Hyponatremia Sodium 129 but at baseline Her baseline and likely related to ESRD and HD. Management per nephrology.  HTN (hypertension) Continue amlodipine, hydralazine Controlled.  Diabetes mellitus, type II ([place]) Had some hypoglycemic episodes and CBGs monitored closely, no  further episodes. Not on insulins.   A1c well controlled at 6.2 on 7/51/7001  Chronic diastolic  CHF (congestive heart failure) ([place]) Compensated.  Continue furosemide. Volume management across HD.  Physical debility Patient is profoundly weak. As per son's report, not even able to feed herself. PT input appreciated and recommend SNF.  Son agreeable. TOC consulted for SNF placement  Coronary artery disease involving native coronary artery of native heart without angina pectoris Continue aspirin and atorvastatin with nitroglycerin as needed chest pain   Body mass index is 30.4 kg/m.     DVT prophylaxis: heparin injection 5,000 Units Start: 11/27/21 0600     Code Status: Full Code:  Family Communication: Discussed with son, updated care and answered questions Disposition:  Status is: inpatient.    Consultants:   Nephrology  Procedures:     Antimicrobials:   As above   Subjective:  Reports ongoing cough, mostly dry.  States that she had 3-4 episodes of loose stools last night.  Some low back pain.  As per son, patient profoundly weak and unable to even feed herself and needs assistance for same.  Only able to hold drinks.  Objective:   Vitals:   11/29/21 1641 11/29/21 2109 11/30/21 0430 11/30/21 0802  BP:  (!) 172/63 (!) 162/66 (!) 162/58  Pulse: 60 69 66 66  Resp: '13 20 18 16  '$ Temp:  99.2 F (37.3 C) 98.4 F (36.9 C) 98.4 F (36.9 C)  TempSrc:  Oral Oral Oral  SpO2: 99% 100% 99%   Weight: 75.4 kg     Height:        General exam: Middle-age female, moderately built and nourished lying comfortably propped up in bed.  Looks much improved and this is the best I have seen her over the last 4 days. Respiratory system: Occasional basal crackles but otherwise clear to auscultation.  No increased work of breathing Cardiovascular system: S1 & S2 heard, RRR. No JVD, murmurs, rubs, gallops or clicks. No pedal edema.  Telemetry personally reviewed: Sinus rhythm. Gastrointestinal system: Abdomen is nondistended, soft and nontender. No organomegaly or masses felt.  Normal bowel sounds heard. Central nervous system: Alert and oriented.  Extremely hard of hearing makes it difficult to communicate.  Can only hear out of her right ear.  No focal neurological deficits. Extremities: Symmetric 5 x 5 power. Skin: No rashes, lesions or ulcers Psychiatry: Judgement and insight impaired but improved.  Mood & affect difficult to assess.    Data Reviewed:   I have personally reviewed following labs and imaging studies   CBC: Recent Labs  Lab 11/27/21 0354 11/29/21 1405  WBC 7.5 4.4  NEUTROABS 4.3  --   HGB 12.1 12.3  HCT 39.5 39.1  MCV 88.6 88.3  PLT 319 749    Basic Metabolic Panel: Recent Labs  Lab 11/27/21 0354 11/29/21 1405  NA 129* 131*  K 4.7 4.2  CL 89* 93*  CO2 25 27  GLUCOSE 99 78  BUN 33* 24*  CREATININE 6.37* 6.10*  CALCIUM 7.9* 8.0*  MG 1.9  --   PHOS  --  6.6*    Liver Function Tests: Recent Labs  Lab 11/27/21 0354 11/29/21 1405  AST 12*  --   ALT 8  --   ALKPHOS 103  --   BILITOT 0.7  --   PROT 6.8  --   ALBUMIN 3.2* 2.8*    CBG: Recent Labs  Lab 11/29/21 1229 11/29/21 2105 11/30/21 0759  GLUCAP 90 166*  74    Microbiology Studies:   Recent Results (from the past 240 hour(s))  Resp Panel by RT-PCR (Flu A&B, Covid) Nasopharyngeal Swab     Status: Abnormal   Collection Time: 11/27/21  3:54 AM   Specimen: Nasopharyngeal Swab; Nasopharyngeal(NP) swabs in vial transport medium  Result Value Ref Range Status   SARS Coronavirus 2 by RT PCR POSITIVE (A) NEGATIVE Final    Comment: (NOTE) SARS-CoV-2 target nucleic acids are DETECTED.  The SARS-CoV-2 RNA is generally detectable in upper respiratory specimens during the acute phase of infection. Positive results are indicative of the presence of the identified virus, but do not rule out bacterial infection or co-infection with other pathogens not detected by the test. Clinical correlation with patient history and other diagnostic information is necessary to  determine patient infection status. The expected result is Negative.  Fact Sheet for Patients: EntrepreneurPulse.com.au  Fact Sheet for Healthcare Providers: IncredibleEmployment.be  This test is not yet approved or cleared by the [place] FDA and  has been authorized for detection and/or diagnosis of SARS-CoV-2 by FDA under an Emergency Use Authorization (EUA).  This EUA will remain in effect (meaning this test can be used) for the duration of  the COVID-19 declaration under Section 564(b)(1) of the A ct, 21 U.S.C. section 360bbb-3(b)(1), unless the authorization is terminated or revoked sooner.     Influenza A by PCR NEGATIVE NEGATIVE Final   Influenza B by PCR NEGATIVE NEGATIVE Final    Comment: (NOTE) The Xpert Xpress SARS-CoV-2/FLU/RSV plus assay is intended as an aid in the diagnosis of influenza from Nasopharyngeal swab specimens and should not be used as a sole basis for treatment. Nasal washings and aspirates are unacceptable for Xpert Xpress SARS-CoV-2/FLU/RSV testing.  Fact Sheet for Patients: EntrepreneurPulse.com.au  Fact Sheet for Healthcare Providers: IncredibleEmployment.be  This test is not yet approved or cleared by the [place] FDA and has been authorized for detection and/or diagnosis of SARS-CoV-2 by FDA under an Emergency Use Authorization (EUA). This EUA will remain in effect (meaning this test can be used) for the duration of the COVID-19 declaration under Section 564(b)(1) of the Act, 21 U.S.C. section 360bbb-3(b)(1), unless the authorization is terminated or revoked.  Performed at [facility], [address] [place], [place] [address]   Blood culture (single)     Status: None (Preliminary result)   Collection Time: 11/27/21  5:04 AM   Specimen: BLOOD  Result Value Ref Range Status   Specimen Description BLOOD LEFT FA  Final   Special Requests   Final     BOTTLES DRAWN AEROBIC AND ANAEROBIC Blood Culture adequate volume   Culture   Final    NO GROWTH 3 DAYS Performed at [facility], [address], [place], [place] [address]    Report Status PENDING  Incomplete  Expectorated Sputum Assessment w Gram Stain, Rflx to Resp Cult     Status: None   Collection Time: 11/27/21  6:57 AM   Specimen: Expectorated Sputum  Result Value Ref Range Status   Specimen Description EXPECTORATED SPUTUM  Final   Special Requests NONE  Final   Sputum evaluation   Final    THIS SPECIMEN IS ACCEPTABLE FOR SPUTUM CULTURE Performed at [facility], [address] [place], [place] [address]    Report Status 11/27/2021 FINAL  Final  Culture, Respiratory w Gram Stain     Status: None   Collection Time: 11/27/21  6:57 AM  Result Value Ref Range  Status   Specimen Description   Final    EXPECTORATED SPUTUM Performed at [facility], [address] [place], [place] [address]    Special Requests   Final    NONE Reflexed from [id number] Performed at [facility], [place], [place] [address]    Gram Stain   Final    MODERATE SQUAMOUS EPITHELIAL CELLS PRESENT FEW WBC PRESENT,BOTH PMN AND MONONUCLEAR FEW BUDDING YEAST SEEN FEW GRAM POSITIVE COCCI FEW GRAM NEGATIVE RODS Performed at [facility], [place] [address] [place], [place] [address]    Culture   Final    ABUNDANT ESCHERICHIA COLI Confirmed Extended Spectrum Beta-Lactamase Producer (ESBL).  In bloodstream infections from ESBL organisms, carbapenems are preferred over piperacillin/tazobactam. They are shown to have a lower risk of mortality.    Report Status 11/30/2021 FINAL  Final   Organism ID, Bacteria ESCHERICHIA COLI  Final      Susceptibility   Escherichia coli - MIC*    AMPICILLIN >=32 RESISTANT Resistant     CEFAZOLIN >=64 RESISTANT Resistant     CEFEPIME >=32 RESISTANT Resistant     CEFTAZIDIME RESISTANT Resistant     CEFTRIAXONE >=64  RESISTANT Resistant     CIPROFLOXACIN >=4 RESISTANT Resistant     GENTAMICIN <=1 SENSITIVE Sensitive     IMIPENEM <=0.25 SENSITIVE Sensitive     TRIMETH/SULFA <=20 SENSITIVE Sensitive     AMPICILLIN/SULBACTAM >=32 RESISTANT Resistant     PIP/TAZO 64 INTERMEDIATE Intermediate     * ABUNDANT ESCHERICHIA COLI    Radiology Studies:  No results found.  Scheduled Meds:    albuterol  2 puff Inhalation Q6H   amLODipine  10 mg Oral Daily   aspirin EC  81 mg Oral Daily   atorvastatin  20 mg Oral QPM   Chlorhexidine Gluconate Cloth  6 each Topical Q0600   cholecalciferol  1,000 Units Oral Daily   feeding supplement  1 Container Oral TID BM   feeding supplement (NEPRO CARB STEADY)  237 mL Oral BID BM   gabapentin  100 mg Oral QHS   heparin  5,000 Units Subcutaneous Q8H   mirtazapine  7.5 mg Oral QHS   multivitamin  1 tablet Oral QHS   pantoprazole  40 mg Oral BID   sevelamer carbonate  800 mg Oral TID WC & HS   cyanocobalamin  1,000 mcg Oral Daily    Continuous Infusions:    cefTRIAXone (ROCEPHIN)  IV 1 g (11/30/21 0109)     LOS: 2 days     Vernell Leep, MD,  FACP, [facility], [facility], [facility] (Care Management Physician Certified) [place]  To contact the attending provider between 7A-7P or the covering provider during after hours 7P-7A, please log into the web site [email or web address] and access using universal [place] password for that web site. If you do not have the password, please call the hospital operator.  11/30/2021, 3:56 PM

## 2021-11-30 NOTE — Progress Notes (Signed)
PHARMACIST - PHYSICIAN COMMUNICATION ? ?CONCERNING: Antibiotic IV to Oral Route Change Policy ? ?RECOMMENDATION: ?This patient is receiving azithromycin by the intravenous route.  Based on criteria approved by the Pharmacy and Therapeutics Committee, the antibiotic(s) is/are being converted to the equivalent oral dose form(s). ? ? ?DESCRIPTION: ?These criteria include: ?Patient being treated for a respiratory tract infection, urinary tract infection, cellulitis or clostridium difficile associated diarrhea if on metronidazole ?The patient is not neutropenic and does not exhibit a GI malabsorption state ?The patient is eating (either orally or via tube) and/or has been taking other orally administered medications for a least 24 hours ?The patient is improving clinically and has a Tmax < 100.5 ? ?If you have questions about this conversion, please contact the Pharmacy Department  ? ?[name]  ?11/30/21  ?  ?

## 2021-11-30 NOTE — Assessment & Plan Note (Addendum)
Patient noted to be profoundly weak.  No obvious focal neurological deficits noted.  This is all likely effects of recent infection along with COVID.  Seen by physical therapy.  Skilled nursing facility is recommended.

## 2021-12-01 DIAGNOSIS — J159 Unspecified bacterial pneumonia: Secondary | ICD-10-CM | POA: Diagnosis not present

## 2021-12-01 LAB — GLUCOSE, CAPILLARY
Glucose-Capillary: 184 mg/dL — ABNORMAL HIGH (ref 70–99)
Glucose-Capillary: 127 mg/dL — ABNORMAL HIGH (ref 70–99)
Glucose-Capillary: 120 mg/dL — ABNORMAL HIGH (ref 70–99)
Glucose-Capillary: 136 mg/dL — ABNORMAL HIGH (ref 70–99)

## 2021-12-01 MED ORDER — MENTHOL 3 MG MT LOZG
1.0000 | LOZENGE | OROMUCOSAL | Status: DC | PRN
  Administered 2021-12-01: 3 mg via ORAL
  Filled 2021-12-01 (×2): qty 9

## 2021-12-01 NOTE — Progress Notes (Signed)
PROGRESS NOTE   [name]  LHT:[id number]    DOB: 06-May-1961    DOA: 11/27/2021  PCP: Denton Lank, MD   I have briefly reviewed patients previous medical records in [facility].  Chief Complaint  Patient presents with   Generalized Body Aches    Hospital Course:  61 year old female with medical history significant for ESRD on TTS HD, type II DM, chronic diastolic CHF, CAD, PAD, chronic respiratory failure on home oxygen 2 L/min, COPD, anemia, HLD, GERD, hard of hearing, presented to the ED with 7 to 10 days of cough, chills, weakness and myalgia.  Reported testing positive for COVID 19 at the dialysis center more than a week ago.  Denies dyspnea or chest pain.  Developed self-limiting diarrhea 3 days PTA without nausea, vomiting or abdominal pain.  Admitted for bacterial pneumonia superimposed on recent COVID-19 infection.  Nephrology consulted for HD needs.  On 3/9, patient obtunded, rapid response initiated, improved after a dose of Narcan, opioids discontinued.  Completed 5 days course of IV antibiotics for pneumonia.  Clinically improved.  Medically optimized for DC to SNF pending bed/insurance.  TOC consulted   Assessment & Plan:  Principal Problem:   Bacterial pneumonia Active Problems:   Unresponsiveness   COVID-19 virus infection   Chronic respiratory failure with hypoxia (HCC)   End-stage renal disease on hemodialysis (HCC)   Hyponatremia   HTN (hypertension)   Diabetes mellitus, type II (HCC)   Chronic diastolic CHF (congestive heart failure) (HCC)   Coronary artery disease involving native coronary artery of native heart without angina pectoris   Physical debility   Assessment and Plan: * Bacterial pneumonia Based on symptomatology including productive cough and chest x-ray findings. Procalcitonin elevated but unclear significance in the context of ESRD patient. Completed total 5 days of IV antibiotics including ceftriaxone and azithromycin Blood cultures  negative to date.  Sputum culture showed E. coli, MDR except to gentamicin, imipenem and Bactrim As per communication with pharmacy, patient has clinically improved despite current antibiotics not covering for E. coli and hence completed ceftriaxone.  Option for treating E. coli would be Bactrim, not appropriate in dialysis patient Ongoing supportive care and aggressive pulmonary toilet.  Strict aspiration precautions.  Unresponsiveness Acute toxic metabolic encephalopathy On 3/9, RN alerted that patient was barely arousable, respiratory rate of 10. Ordered Narcan, ABG and advised to activate rapid response team. Improved after dose of Narcan. Likely related to Vicodin, Tussionex complicating dialysis patient. Discontinued all opioids and minimize use of same. Patient awake, alert and oriented today. Resolved.  Also does not seem to be having pain issues requiring any meds at this time.  COVID-19 virus infection Per patient report, tested positive for COVID-19 at the outpatient dialysis center more than a week ago. Hypoxia no worse than her baseline and requiring 2 L/min [place] oxygen. Discussed in detail with pharmacy, given duration of her COVID infection, no antivirals initiated.  No indication for steroids in the absence of worsening hypoxia. Supportive treatment.  Chronic respiratory failure with hypoxia (HCC) 2 L/min O2 requirement at baseline. Appears to be at baseline.  Unclear why she is on 4 L well saturating in the high 90s.  Needs to be titrated down to her home level. ABG this afternoon during unresponsive episode, pH 7.35, PCO2 52, PO2 108 and oxygen saturation 98.9  End-stage renal disease on hemodialysis [facility]) Nephrology consulted for HD needs.  Hyponatremia Sodium 129 but at baseline Her baseline and likely related to ESRD and  HD. Management per nephrology.  HTN (hypertension) Continue amlodipine, hydralazine Controlled.  Diabetes mellitus, type II ([place]) Had some  hypoglycemic episodes and CBGs monitored closely, no further episodes. Not on insulins.   A1c well controlled at 6.2 on 03/16/6388  Chronic diastolic CHF (congestive heart failure) ([place]) Compensated.  Continue furosemide. Volume management across HD.  Physical debility Patient is profoundly weak. As per son's report, not even able to feed herself.  However on my exam, has at least 4+ by 5 power in bilateral upper extremities and so unsure why she cannot feed herself.  Requested RN and NP to encourage patient to feed herself and supervised same. PT input appreciated and recommend SNF.  Son agreeable. TOC consulted for SNF placement  Coronary artery disease involving native coronary artery of native heart without angina pectoris Continue aspirin and atorvastatin with nitroglycerin as needed chest pain   Body mass index is 30.4 kg/m.     DVT prophylaxis: heparin injection 5,000 Units Start: 11/27/21 0600     Code Status: Full Code:  Family Communication: None at bedside today. Disposition:  Status is: inpatient.    Consultants:   Nephrology  Procedures:   HD  Antimicrobials:   As above   Subjective:  Reported some headache.  No dyspnea.  Cough improved.  No acute issues reported by nursing.  Extreme difficulty in communicating with patient due to hard of hearing.  Objective:   Vitals:   12/01/21 0000 12/01/21 0107 12/01/21 0459 12/01/21 0907  BP:  (!) 166/70 (!) 156/55 (!) 153/58  Pulse:  75 71 71  Resp:  '16 16 17  '$ Temp: 98.7 F (37.1 C) 98.2 F (36.8 C) 98.3 F (36.8 C) 98.3 F (36.8 C)  TempSrc: Oral Oral Oral Oral  SpO2:  100% 99% 100%  Weight:      Height:        General exam: Middle-age female, moderately built and nourished lying comfortably propped up in bed.  Respiratory system: Very occasional basal crackles but otherwise clear to auscultation without wheezing, rhonchi.  No increased work of breathing. Cardiovascular system: S1 and S2 heard, RRR.   No JVD, murmurs or pedal edema.  Telemetry personally reviewed: Sinus rhythm. Gastrointestinal system: Abdomen is nondistended, soft and nontender. No organomegaly or masses felt. Normal bowel sounds heard. Central nervous system: Alert and oriented.  Extremely hard of hearing makes it difficult to communicate.  Can only hear out of her right ear.  No focal neurological deficits. Extremities: Symmetric 5 x 5 power.  Bilateral legs with chronic changes including lichenification of skin. Skin: No rashes, lesions or ulcers Psychiatry: Judgement and insight impaired but improved.  Mood & affect difficult to assess.    Data Reviewed:   I have personally reviewed following labs and imaging studies   CBC: Recent Labs  Lab 11/27/21 0354 11/29/21 1405  WBC 7.5 4.4  NEUTROABS 4.3  --   HGB 12.1 12.3  HCT 39.5 39.1  MCV 88.6 88.3  PLT 319 373    Basic Metabolic Panel: Recent Labs  Lab 11/27/21 0354 11/29/21 1405  NA 129* 131*  K 4.7 4.2  CL 89* 93*  CO2 25 27  GLUCOSE 99 78  BUN 33* 24*  CREATININE 6.37* 6.10*  CALCIUM 7.9* 8.0*  MG 1.9  --   PHOS  --  6.6*    Liver Function Tests: Recent Labs  Lab 11/27/21 0354 11/29/21 1405  AST 12*  --   ALT 8  --   [facility]  103  --   BILITOT 0.7  --   PROT 6.8  --   ALBUMIN 3.2* 2.8*    CBG: Recent Labs  Lab 12/01/21 0105 12/01/21 0902 12/01/21 1140  GLUCAP 184* 127* 120*    Microbiology Studies:   Recent Results (from the past 240 hour(s))  Resp Panel by RT-PCR (Flu A&B, Covid) Nasopharyngeal Swab     Status: Abnormal   Collection Time: 11/27/21  3:54 AM   Specimen: Nasopharyngeal Swab; Nasopharyngeal(NP) swabs in vial transport medium  Result Value Ref Range Status   SARS Coronavirus 2 by RT PCR POSITIVE (A) NEGATIVE Final    Comment: (NOTE) SARS-CoV-2 target nucleic acids are DETECTED.  The SARS-CoV-2 RNA is generally detectable in upper respiratory specimens during the acute phase of infection. Positive results  are indicative of the presence of the identified virus, but do not rule out bacterial infection or co-infection with other pathogens not detected by the test. Clinical correlation with patient history and other diagnostic information is necessary to determine patient infection status. The expected result is Negative.  Fact Sheet for Patients: EntrepreneurPulse.com.au  Fact Sheet for Healthcare Providers: IncredibleEmployment.be  This test is not yet approved or cleared by the [place] FDA and  has been authorized for detection and/or diagnosis of SARS-CoV-2 by FDA under an Emergency Use Authorization (EUA).  This EUA will remain in effect (meaning this test can be used) for the duration of  the COVID-19 declaration under Section 564(b)(1) of the A ct, 21 U.S.C. section 360bbb-3(b)(1), unless the authorization is terminated or revoked sooner.     Influenza A by PCR NEGATIVE NEGATIVE Final   Influenza B by PCR NEGATIVE NEGATIVE Final    Comment: (NOTE) The Xpert Xpress SARS-CoV-2/FLU/RSV plus assay is intended as an aid in the diagnosis of influenza from Nasopharyngeal swab specimens and should not be used as a sole basis for treatment. Nasal washings and aspirates are unacceptable for Xpert Xpress SARS-CoV-2/FLU/RSV testing.  Fact Sheet for Patients: EntrepreneurPulse.com.au  Fact Sheet for Healthcare Providers: IncredibleEmployment.be  This test is not yet approved or cleared by the [place] FDA and has been authorized for detection and/or diagnosis of SARS-CoV-2 by FDA under an Emergency Use Authorization (EUA). This EUA will remain in effect (meaning this test can be used) for the duration of the COVID-19 declaration under Section 564(b)(1) of the Act, 21 U.S.C. section 360bbb-3(b)(1), unless the authorization is terminated or revoked.  Performed at [facility], [address] [place], [place] [address]   Blood culture (single)     Status: None (Preliminary result)   Collection Time: 11/27/21  5:04 AM   Specimen: BLOOD  Result Value Ref Range Status   Specimen Description BLOOD LEFT FA  Final   Special Requests   Final    BOTTLES DRAWN AEROBIC AND ANAEROBIC Blood Culture adequate volume   Culture   Final    NO GROWTH 4 DAYS Performed at [facility], [address] [place], [place] [address]    Report Status PENDING  Incomplete  Expectorated Sputum Assessment w Gram Stain, Rflx to Resp Cult     Status: None   Collection Time: 11/27/21  6:57 AM   Specimen: Expectorated Sputum  Result Value Ref Range Status   Specimen Description EXPECTORATED SPUTUM  Final   Special Requests NONE  Final   Sputum evaluation   Final    THIS SPECIMEN IS ACCEPTABLE FOR SPUTUM CULTURE Performed at [facility], [address] [place], [place] [address]    Report Status 11/27/2021 FINAL  Final  Culture, Respiratory w Gram Stain     Status: None   Collection Time: 11/27/21  6:57 AM  Result Value Ref Range Status   Specimen Description   Final    EXPECTORATED SPUTUM Performed at [facility], [address] [place], [place] [address]    Special Requests   Final    NONE Reflexed from [id number] Performed at [facility], [place], [place] [address]    Gram Stain   Final    MODERATE SQUAMOUS EPITHELIAL CELLS PRESENT FEW WBC PRESENT,BOTH PMN AND MONONUCLEAR FEW BUDDING YEAST SEEN FEW GRAM POSITIVE COCCI FEW GRAM NEGATIVE RODS Performed at [facility], [place] [address], [place], [place] [address]    Culture   Final    ABUNDANT ESCHERICHIA COLI Confirmed Extended Spectrum Beta-Lactamase Producer (ESBL).  In bloodstream infections from ESBL organisms, carbapenems are preferred over piperacillin/tazobactam. They are shown to have a lower risk of mortality.    Report Status 11/30/2021 FINAL  Final   Organism ID,  Bacteria ESCHERICHIA COLI  Final      Susceptibility   Escherichia coli - MIC*    AMPICILLIN >=32 RESISTANT Resistant     CEFAZOLIN >=64 RESISTANT Resistant     CEFEPIME >=32 RESISTANT Resistant     CEFTAZIDIME RESISTANT Resistant     CEFTRIAXONE >=64 RESISTANT Resistant     CIPROFLOXACIN >=4 RESISTANT Resistant     GENTAMICIN <=1 SENSITIVE Sensitive     IMIPENEM <=0.25 SENSITIVE Sensitive     TRIMETH/SULFA <=20 SENSITIVE Sensitive     AMPICILLIN/SULBACTAM >=32 RESISTANT Resistant     PIP/TAZO 64 INTERMEDIATE Intermediate     * ABUNDANT ESCHERICHIA COLI    Radiology Studies:  No results found.  Scheduled Meds:    albuterol  2 puff Inhalation Q6H   amLODipine  10 mg Oral Daily   aspirin EC  81 mg Oral Daily   atorvastatin  20 mg Oral QPM   Chlorhexidine Gluconate Cloth  6 each Topical Q0600   cholecalciferol  1,000 Units Oral Daily   feeding supplement  1 Container Oral TID BM   feeding supplement (NEPRO CARB STEADY)  237 mL Oral BID BM   gabapentin  100 mg Oral QHS   heparin  5,000 Units Subcutaneous Q8H   mirtazapine  7.5 mg Oral QHS   multivitamin  1 tablet Oral QHS   pantoprazole  40 mg Oral BID   sevelamer carbonate  800 mg Oral TID WC & HS   cyanocobalamin  1,000 mcg Oral Daily    Continuous Infusions:       LOS: 3 days     Vernell Leep, MD,  FACP, [facility], [facility], [facility] (Care Management Physician Certified) [place]  To contact the attending provider between 7A-7P or the covering provider during after hours 7P-7A, please log into the web site [email or web address] and access using universal [place] password for that web site. If you do not have the password, please call the hospital operator.  12/01/2021, 1:30 PM

## 2021-12-01 NOTE — TOC Progression Note (Addendum)
Transition of Care (TOC) - Progression Note  ? ? ?Patient Details  ?Name: [name] ?MRN: [id number] ?Date of Birth: [birth year] ? ?Transition of Care (TOC) CM/SW Contact  ?Izola Price, RN ?Phone Number: ?12/01/2021, 1:54 PM ? ?Clinical Narrative:  3/12: Spoke with son Kambrea Carrasco, regarding STR/SNF placement. He was driving and could not speak long. voiced concern that she is not strong enough to even feed herself much less toilet herself at a SNF. RN CM had already reached out to provider to have PT/OT treat as well as re-evaluate as seen last 3/9  as within 48 hour PT notes will be needed for placement, and relayed this to him. She was too weak to hold the phone thought she is more alert. His choices were [facility] or [facility]. Explained process and broad bed search in case alternatives needed. He agreed and said he understands this process as she had been at [facility] once before with a wound vac. Permission to start search and to share needed information for that purpose. Simmie Davies RN Jearld Lesch [phone].  ? ?3:00 pm: Update Bed search initiated. [facility] and [facility] first choices. Patient was C+ on admission on 11/27/21. Per provider and nephrology patient test positive on 11/20/21 in outpatient HD setting. Please inform any SNF bed offers/facilities of these dates for isolation requirements per facility. Simmie Davies RN CM  ? ? ? ?  ?  ? ?Expected Discharge Plan and Services ?  ?  ?  ?  ?  ?                ?  ?  ?  ?  ?  ?  ?  ?  ?  ?  ? ? ?Social Determinants of Health (SDOH) Interventions ?  ? ?Readmission Risk Interventions ?Readmission Risk Prevention Plan 05/03/2021 12/11/2020 09/21/2020  ?Transportation Screening Complete Complete Complete  ?Medication Review Press photographer) Complete Complete Complete  ?PCP or Specialist appointment within 3-5 days of discharge - Complete Complete  ?[place] or Home Care Consult - Complete Complete  ?SW Recovery Care/Counseling Consult - Complete Complete  ?Palliative Care Screening  Complete Not Applicable Complete  ?Skilled Nursing Facility - Not Complete Complete  ?SNF Comments - working on placement -  ?Some recent data might be hidden  ? ? ?

## 2021-12-01 NOTE — NC FL2 (Signed)
[place] LEVEL OF CARE SCREENING TOOL     IDENTIFICATION  Patient Name: [name] Birthdate: [birth year] Sex: female Admission Date (Current Location): 11/27/2021  [facility] and [place] Number:  [name] [id number] [place] and Address:  [facility], [address] [place], [place] [address]      Provider Number: [id number]  Attending Physician Name and Address:  Modena Jansky, MD  Relative Name and Phone Number:  Taaliyah, [place])   [phone] [facility] Phone)    Current Level of Care: Hospital Recommended Level of Care: [place] Prior Approval Number:    Date Approved/Denied: 04/04/15 PASRR Number: [id number]  Discharge Plan: SNF    Current Diagnoses: Patient Active Problem List   Diagnosis Date Noted   Hyponatremia 11/27/2021   COVID-19 virus infection 11/27/2021   Bacterial pneumonia 11/27/2021   Acute GI bleeding 11/15/2021   Upper GI bleed 11/15/2021   Anemia of chronic kidney failure 05/03/2021   Thrombocytopenia ([place]) 05/03/2021   Severe sepsis with septic shock ([place]) 04/25/2021   HCAP (healthcare-associated pneumonia) 68/07/5725   Acute metabolic encephalopathy 20/35/5974   UTI (urinary tract infection) 03/29/2021   Chronic respiratory failure with hypoxia ([place]) 12/18/2020   Superior vena caval syndrome 12/14/2020   Chronic diastolic CHF (congestive heart failure) ([place]) 12/04/2020   Bradycardia 09/17/2020   Advanced care planning/counseling discussion    Discitis of thoracic region    Fluid overload 08/10/2020   HLD (hyperlipidemia) 08/10/2020   Chest pain 08/10/2020   CAD (coronary artery disease) 08/10/2020   GERD (gastroesophageal reflux disease) 08/10/2020   Obesity (BMI 30-39.9) 06/06/2020   Anemia in ESRD (end-stage renal disease) ([place]) 05/31/2020   Type 1 diabetes mellitus ([place]) 05/31/2020   End-stage renal disease ([place]) 05/31/2020   Hypertensive disorder 05/31/2020   Arm pain 05/21/2020    Cellulitis of right leg 05/02/2020   ESRD (end stage renal disease) ([place]) 04/13/2020   SOBOE (shortness of breath on exertion) 02/08/2020   Hematoma of arm, left, subsequent encounter 11/24/2019   Respiratory failure (HCC)    Shortness of breath    Complication of arteriovenous dialysis fistula 11/13/2019   PVD (peripheral vascular disease) ([place]) 11/13/2019   Severe anemia 11/13/2019   Pressure ulcer, stage 2 ([place]) 11/13/2019   ESRD on hemodialysis ([place])    Hemorrhagic shock ([place]) 11/11/2019   Leg pain 10/02/2019   CAP (community acquired pneumonia) 10/01/2019   Atherosclerosis of native arteries of extremity with intermittent claudication ([place]) 05/01/2019   Right-sided headache    COPD (chronic obstructive pulmonary disease) ([place]) 10/06/2018   Syncope 10/04/2018   Symptomatic anemia 06/18/2018   Gastroparesis due to DM ([place]) 16/38/4536   Complication of vascular access for dialysis 12/04/2017   Osteomyelitis ([place]) 09/30/2017   Carotid stenosis 06/18/2017   Bilateral carotid artery stenosis 06/16/2017   Benign essential HTN 05/19/2017   CKD (chronic kidney disease) stage 5, GFR less than 15 ml/min ([place]) 05/19/2017   Hyperlipidemia, mixed 05/19/2017   Shortness of breath 05/04/2017   Rash, skin 12/16/2016   Cellulitis of lower extremity 07/29/2016   Chronic venous insufficiency 07/29/2016   Lymphedema 07/29/2016   TIA (transient ischemic attack) 04/21/2016   Altered mental status 04/08/2016   Hyperammonemia ([place]) 04/08/2016   Elevated troponin 04/08/2016   Depression 04/08/2016   Depression, major, recurrent, severe with psychosis ([place]) 04/08/2016   Blood in stool    Intractable cyclical vomiting with nausea    Reflux esophagitis    Gastritis    Generalized  abdominal pain    Uncontrollable vomiting    Major depressive disorder, recurrent episode, moderate ([place]) 03/15/2016   Adjustment disorder with mixed anxiety and depressed mood 03/15/2016   Malnutrition of moderate  degree 12/01/2015   Renal mass    Dyspnea    Acute renal failure (HCC)    Respiratory failure ([place])    High temperature 11/14/2015   Encounter for central line placement    Encounter for orogastric (OG) tube placement    Nausea 11/12/2015   Hyperkalemia 10/03/2015   Diarrhea, unspecified 07/22/2015   Hypoglycemia 04/24/2015   Unresponsiveness 04/24/2015   Sinus bradycardia 04/24/2015   Hypothermia 04/24/2015   Acute hypoxemic respiratory failure ([place]) 04/24/2015   Acute on chronic diastolic CHF (congestive heart failure) ([place]) 04/05/2015   Diabetic gastroparesis ([place]) 04/05/2015   Hypokalemia 04/05/2015   Physical debility 04/05/2015   Acute pulmonary edema (HCC) 04/03/2015   Nausea and vomiting 04/03/2015   Hypoglycemia associated with diabetes ([place]) 04/03/2015   Anemia of chronic disease 04/03/2015   Secondary hyperparathyroidism ([place]) 04/03/2015   Pressure ulcer 04/02/2015   Acute respiratory failure with hypoxia ([place]) 04/01/2015   Adjustment disorder with anxiety 03/14/2015   Somatic symptom disorder, mild 03/08/2015   Coronary artery disease involving native coronary artery of native heart without angina pectoris    Nausea & vomiting 03/06/2015   Abdominal pain 03/06/2015   Diabetes mellitus, type II ([place]) 03/06/2015   HTN (hypertension) 03/06/2015   Gastroparesis 02/24/2015   Pleural effusion 02/19/2015   Bacteremia due to Enterococcus 02/19/2015   End-stage renal disease on hemodialysis ([place]) 02/19/2015   Diarrhea 08/01/2014    Orientation RESPIRATION BLADDER Height & Weight     Self, Place, Situation  O2 Incontinent Weight: 75.4 kg Height:  '5\' 2"'$  (157.5 cm)  BEHAVIORAL SYMPTOMS/MOOD NEUROLOGICAL BOWEL NUTRITION STATUS      Incontinent Diet (Needs help feeding.)  AMBULATORY STATUS COMMUNICATION OF NEEDS Skin   Extensive Assist Verbally                         Personal Care Assistance Level of Assistance  Bathing, Feeding, Dressing, Total care Bathing  Assistance: Maximum assistance Feeding assistance: Maximum assistance Dressing Assistance: Maximum assistance Total Care Assistance: Maximum assistance   Functional Limitations Info             SPECIAL CARE FACTORS FREQUENCY  OT (By licensed OT), PT (By licensed PT)     PT Frequency: 5x/week OT Frequency: 5x/week            Contractures      Additional Factors Info  Isolation Precautions, Code Status, Allergies Code Status Info: Full Code Allergies Info: ACE inhibitors (anaphylaxis, swelling), Compazine (anaphylaxis), Ativan, plus 9 others, please see chart for full details and all reactions.     Isolation Precautions Info: ESBL Contact     Current Medications (12/01/2021):  This is the current hospital active medication list Current Facility-Administered Medications  Medication Dose Route Frequency Provider Last Rate Last Admin   acetaminophen (TYLENOL) tablet 650 mg  650 mg Oral Q6H PRN Modena Jansky, MD   650 mg at 12/01/21 0114   albuterol (VENTOLIN HFA) 108 (90 Base) MCG/ACT inhaler 2 puff  2 puff Inhalation Q6H Athena Masse, MD   2 puff at 12/01/21 1345   amLODipine (NORVASC) tablet 10 mg  10 mg Oral Daily Modena Jansky, MD   10 mg at 12/01/21 7616   aspirin EC tablet 81 mg  81 mg Oral Daily Vernell Leep D, MD   81 mg at 12/01/21 4765   atorvastatin (LIPITOR) tablet 20 mg  20 mg Oral QPM Hongalgi, Lenis Dickinson, MD   20 mg at 11/30/21 1809   calcium carbonate (TUMS - dosed in mg elemental calcium) chewable tablet 500 mg of elemental calcium  500 mg of elemental calcium Oral Q6H PRN Modena Jansky, MD   500 mg of elemental calcium at 11/29/21 1857   Chlorhexidine Gluconate Cloth 2 % PADS 6 each  6 each Topical Q0600 Colon Flattery, NP   6 each at 12/01/21 4650   cholecalciferol (VITAMIN D3) tablet 1,000 Units  1,000 Units Oral Daily Modena Jansky, MD   1,000 Units at 12/01/21 3546   feeding supplement (BOOST / RESOURCE BREEZE) liquid 1 Container  1  Container Oral TID BM Hongalgi, Lenis Dickinson, MD   1 Container at 12/01/21 1340   feeding supplement (NEPRO CARB STEADY) liquid 237 mL  237 mL Oral BID BM Hongalgi, Anand D, MD   237 mL at 12/01/21 0823   fluticasone (FLONASE) 50 MCG/ACT nasal spray 2 spray  2 spray Each Nare Daily PRN Modena Jansky, MD       gabapentin (NEURONTIN) capsule 100 mg  100 mg Oral QHS Hongalgi, Anand D, MD   100 mg at 11/30/21 2025   guaiFENesin-dextromethorphan (ROBITUSSIN DM) 100-10 MG/5ML syrup 10 mL  10 mL Oral Q4H PRN Athena Masse, MD   10 mL at 12/01/21 1340   heparin injection 5,000 Units  5,000 Units Subcutaneous Q8H Judd Gaudier V, MD   5,000 Units at 12/01/21 1340   menthol-cetylpyridinium (CEPACOL) lozenge 3 mg  1 lozenge Oral PRN Modena Jansky, MD       mirtazapine (REMERON) tablet 7.5 mg  7.5 mg Oral QHS Hongalgi, Anand D, MD   7.5 mg at 11/30/21 2025   multivitamin (RENA-VIT) tablet 1 tablet  1 tablet Oral QHS Modena Jansky, MD   1 tablet at 11/30/21 2024   naloxone [facility]) injection 0.4 mg  0.4 mg Intravenous PRN Vernell Leep D, MD   0.4 mg at 11/28/21 1400   nitroGLYCERIN (NITROSTAT) SL tablet 0.4 mg  0.4 mg Sublingual Q5 min PRN Hongalgi, Anand D, MD       pantoprazole (PROTONIX) EC tablet 40 mg  40 mg Oral BID Vernell Leep D, MD   40 mg at 12/01/21 5681   polyethylene glycol (MIRALAX / GLYCOLAX) packet 17 g  17 g Oral Daily PRN Modena Jansky, MD       promethazine (PHENERGAN) tablet 12.5 mg  12.5 mg Oral Q6H PRN Murlean Iba, MD   12.5 mg at 11/29/21 1725   sevelamer carbonate (RENVELA) tablet 800 mg  800 mg Oral TID WC & HS Hongalgi, Anand D, MD   800 mg at 12/01/21 1149   vitamin B-12 (CYANOCOBALAMIN) tablet 1,000 mcg  1,000 mcg Oral Daily Modena Jansky, MD   1,000 mcg at 12/01/21 2751     Discharge Medications: Please see discharge summary for a list of discharge medications.  Relevant Imaging Results:  Relevant Lab Results:   Additional  Information SSN:[id number]  Izola Price, RN

## 2021-12-02 ENCOUNTER — Inpatient Hospital Stay: Payer: Medicare Other

## 2021-12-02 ENCOUNTER — Encounter: Payer: Self-pay | Admitting: Internal Medicine

## 2021-12-02 DIAGNOSIS — J159 Unspecified bacterial pneumonia: Secondary | ICD-10-CM | POA: Diagnosis not present

## 2021-12-02 LAB — CULTURE, BLOOD (SINGLE)
Culture: NO GROWTH
Special Requests: ADEQUATE

## 2021-12-02 MED ORDER — HEPARIN SODIUM (PORCINE) 1000 UNIT/ML IJ SOLN
INTRAMUSCULAR | Status: AC
  Administered 2021-12-02: 4200 [IU]
  Filled 2021-12-02: qty 10

## 2021-12-02 MED ORDER — PROMETHAZINE HCL 25 MG PO TABS
ORAL_TABLET | ORAL | Status: AC
  Administered 2021-12-02: 12.5 mg via ORAL
  Filled 2021-12-02: qty 1

## 2021-12-02 MED ORDER — BENZONATATE 100 MG PO CAPS
200.0000 mg | ORAL_CAPSULE | Freq: Three times a day (TID) | ORAL | Status: DC
  Administered 2021-12-02 – 2021-12-06 (×13): 200 mg via ORAL
  Filled 2021-12-02 (×14): qty 2

## 2021-12-02 NOTE — Progress Notes (Signed)
[facility]  ROUNDING NOTE   Subjective:   [name] is a 61 year old female with past medical history including diabetes, chronic diastolic heart failure, PVD, CAD, chronic respiratory failure with 2 L oxygen used at home, and end-stage renal disease on hemodialysis.  Patient presents to the emergency department with complaints of diarrhea, nausea, weakness and chills.  Patient has been admitted for under observation for Myalgia [M79.10] Pneumonia [J18.9] Community acquired pneumonia of left lower lobe of lung [J18.9] Bacterial pneumonia [J15.9]  Patient is known to our practice and receives outpatient dialysis at [facility] on a MWF schedule, supervised by Dr Candiss Norse.   Patient seen resting quietly Complains of headache and weakness Poor appetite Denies shortness of breath, remains on room air.  We will dialyze later today  Objective:  Vital signs in last 24 hours:  Temp:  [97.7 F (36.5 C)-98.7 F (37.1 C)] 98.1 F (36.7 C) (03/13 0516) Pulse Rate:  [65-71] 65 (03/13 0516) Resp:  [16-17] 16 (03/13 0516) BP: (150-169)/(56-62) 155/62 (03/13 0516) SpO2:  [100 %] 100 % (03/13 0516)  Weight change:  Filed Weights   11/27/21 1940 11/29/21 1415 11/29/21 1641  Weight: 74.5 kg 77.6 kg 75.4 kg    Intake/Output: No intake/output data recorded.   Intake/Output this shift:  No intake/output data recorded.  Physical Exam: General: NAD, ill-appearing  Head: Normocephalic, atraumatic. Moist oral mucosal membranes  Eyes: Anicteric  Lungs:  Rhonchi, normal effort, 3 L [place]  Heart: Regular rate and rhythm  Abdomen:  Soft, nontender  Extremities: Trace peripheral edema, facial edema  Neurologic: Nonfocal, moving all four extremities  Skin: No lesions  Access: Right PermCath    Basic Metabolic Panel: Recent Labs  Lab 11/27/21 0354 11/29/21 1405  NA 129* 131*  K 4.7 4.2  CL 89* 93*  CO2 25 27  GLUCOSE 99 78  BUN 33* 24*  CREATININE 6.37* 6.10*  CALCIUM  7.9* 8.0*  MG 1.9  --   PHOS  --  6.6*     Liver Function Tests: Recent Labs  Lab 11/27/21 0354 11/29/21 1405  AST 12*  --   ALT 8  --   ALKPHOS 103  --   BILITOT 0.7  --   PROT 6.8  --   ALBUMIN 3.2* 2.8*    No results for input(s): LIPASE, AMYLASE in the last 168 hours. No results for input(s): AMMONIA in the last 168 hours.  CBC: Recent Labs  Lab 11/27/21 0354 11/29/21 1405  WBC 7.5 4.4  NEUTROABS 4.3  --   HGB 12.1 12.3  HCT 39.5 39.1  MCV 88.6 88.3  PLT 319 282     Cardiac Enzymes: No results for input(s): CKTOTAL, CKMB, CKMBINDEX, TROPONINI in the last 168 hours.  BNP: Invalid input(s): POCBNP  CBG: Recent Labs  Lab 11/30/21 1627 12/01/21 0105 12/01/21 0902 12/01/21 1140 12/01/21 1600  GLUCAP 213* 184* 127* 120* 136*     Microbiology: Results for orders placed or performed during the hospital encounter of 11/27/21  Resp Panel by RT-PCR (Flu A&B, Covid) Nasopharyngeal Swab     Status: Abnormal   Collection Time: 11/27/21  3:54 AM   Specimen: Nasopharyngeal Swab; Nasopharyngeal(NP) swabs in vial transport medium  Result Value Ref Range Status   SARS Coronavirus 2 by RT PCR POSITIVE (A) NEGATIVE Final    Comment: (NOTE) SARS-CoV-2 target nucleic acids are DETECTED.  The SARS-CoV-2 RNA is generally detectable in upper respiratory specimens during the acute phase of infection. Positive  results are indicative of the presence of the identified virus, but do not rule out bacterial infection or co-infection with other pathogens not detected by the test. Clinical correlation with patient history and other diagnostic information is necessary to determine patient infection status. The expected result is Negative.  Fact Sheet for Patients: EntrepreneurPulse.com.au  Fact Sheet for Healthcare Providers: IncredibleEmployment.be  This test is not yet approved or cleared by the [place] FDA and  has been  authorized for detection and/or diagnosis of SARS-CoV-2 by FDA under an Emergency Use Authorization (EUA).  This EUA will remain in effect (meaning this test can be used) for the duration of  the COVID-19 declaration under Section 564(b)(1) of the A ct, 21 U.S.C. section 360bbb-3(b)(1), unless the authorization is terminated or revoked sooner.     Influenza A by PCR NEGATIVE NEGATIVE Final   Influenza B by PCR NEGATIVE NEGATIVE Final    Comment: (NOTE) The Xpert Xpress SARS-CoV-2/FLU/RSV plus assay is intended as an aid in the diagnosis of influenza from Nasopharyngeal swab specimens and should not be used as a sole basis for treatment. Nasal washings and aspirates are unacceptable for Xpert Xpress SARS-CoV-2/FLU/RSV testing.  Fact Sheet for Patients: EntrepreneurPulse.com.au  Fact Sheet for Healthcare Providers: IncredibleEmployment.be  This test is not yet approved or cleared by the [place] FDA and has been authorized for detection and/or diagnosis of SARS-CoV-2 by FDA under an Emergency Use Authorization (EUA). This EUA will remain in effect (meaning this test can be used) for the duration of the COVID-19 declaration under Section 564(b)(1) of the Act, 21 U.S.C. section 360bbb-3(b)(1), unless the authorization is terminated or revoked.  Performed at [facility], [address] [place], [place] [address]   Blood culture (single)     Status: None   Collection Time: 11/27/21  5:04 AM   Specimen: BLOOD  Result Value Ref Range Status   Specimen Description BLOOD LEFT FA  Final   Special Requests   Final    BOTTLES DRAWN AEROBIC AND ANAEROBIC Blood Culture adequate volume   Culture   Final    NO GROWTH 5 DAYS Performed at [facility], [address] [place], [place] [address]    Report Status 12/02/2021 FINAL  Final  Expectorated Sputum Assessment w Gram Stain, Rflx to Resp Cult     Status: None   Collection  Time: 11/27/21  6:57 AM   Specimen: Expectorated Sputum  Result Value Ref Range Status   Specimen Description EXPECTORATED SPUTUM  Final   Special Requests NONE  Final   Sputum evaluation   Final    THIS SPECIMEN IS ACCEPTABLE FOR SPUTUM CULTURE Performed at [facility], [address] [place], [place] [address]    Report Status 11/27/2021 FINAL  Final  Culture, Respiratory w Gram Stain     Status: None   Collection Time: 11/27/21  6:57 AM  Result Value Ref Range Status   Specimen Description   Final    EXPECTORATED SPUTUM Performed at [facility], [address] [place], [place] [address]    Special Requests   Final    NONE Reflexed from [id number] Performed at [facility], [place], [place] [address]    Gram Stain   Final    MODERATE SQUAMOUS EPITHELIAL CELLS PRESENT FEW WBC PRESENT,BOTH PMN AND MONONUCLEAR FEW BUDDING YEAST SEEN FEW GRAM POSITIVE COCCI FEW GRAM NEGATIVE RODS Performed at [facility], [place] [address], [place], [place]  [address]    Culture   Final    ABUNDANT ESCHERICHIA COLI Confirmed Extended Spectrum Beta-Lactamase Producer (ESBL).  In bloodstream infections from ESBL organisms, carbapenems are preferred over piperacillin/tazobactam. They are shown to have a lower risk of mortality.    Report Status 11/30/2021 FINAL  Final   Organism ID, Bacteria ESCHERICHIA COLI  Final      Susceptibility   Escherichia coli - MIC*    AMPICILLIN >=32 RESISTANT Resistant     CEFAZOLIN >=64 RESISTANT Resistant     CEFEPIME >=32 RESISTANT Resistant     CEFTAZIDIME RESISTANT Resistant     CEFTRIAXONE >=64 RESISTANT Resistant     CIPROFLOXACIN >=4 RESISTANT Resistant     GENTAMICIN <=1 SENSITIVE Sensitive     IMIPENEM <=0.25 SENSITIVE Sensitive     TRIMETH/SULFA <=20 SENSITIVE Sensitive     AMPICILLIN/SULBACTAM >=32 RESISTANT Resistant     PIP/TAZO 64 INTERMEDIATE Intermediate     * ABUNDANT ESCHERICHIA COLI     Coagulation Studies: No results for input(s): LABPROT, INR in the last 72 hours.  Urinalysis: No results for input(s): COLORURINE, LABSPEC, PHURINE, GLUCOSEU, HGBUR, BILIRUBINUR, KETONESUR, PROTEINUR, UROBILINOGEN, NITRITE, LEUKOCYTESUR in the last 72 hours.  Invalid input(s): APPERANCEUR    Imaging: No results found.   Medications:      albuterol  2 puff Inhalation Q6H   amLODipine  10 mg Oral Daily   aspirin EC  81 mg Oral Daily   atorvastatin  20 mg Oral QPM   Chlorhexidine Gluconate Cloth  6 each Topical Q0600   cholecalciferol  1,000 Units Oral Daily   feeding supplement  1 Container Oral TID BM   feeding supplement (NEPRO CARB STEADY)  237 mL Oral BID BM   gabapentin  100 mg Oral QHS   heparin  5,000 Units Subcutaneous Q8H   mirtazapine  7.5 mg Oral QHS   multivitamin  1 tablet Oral QHS   pantoprazole  40 mg Oral BID   sevelamer carbonate  800 mg Oral TID WC & HS   cyanocobalamin  1,000 mcg Oral Daily   acetaminophen, calcium carbonate, fluticasone, guaiFENesin-dextromethorphan, menthol-cetylpyridinium, naLOXone (NARCAN)  injection, nitroGLYCERIN, polyethylene glycol, promethazine  Assessment/ Plan:  Ms. TABBATHA BORDELON is a 61 y.o.  female past medical history including diabetes, chronic diastolic heart failure, PVD, CAD, chronic respiratory failure with 2 L oxygen used at home, and end-stage renal disease on hemodialysis.  Patient presents to the emergency department with complaints of diarrhea, nausea, weakness and chills.  Patient has been admitted for under observation for Myalgia [M79.10] Pneumonia [J18.9] Community acquired pneumonia of left lower lobe of lung [J18.9] Bacterial pneumonia [J15.9]  [facility]/MWF/right PermCath/73 kg  1.  End-stage renal disease requiring hemodialysis.    Plan to receive dialysis this afternoon to maintain outpatient schedule.  Dialysis coordinator aware of patient and discharge plan possibly including SNF  placement.  2. Anemia of chronic kidney disease  Normocytic Lab Results  Component Value Date   HGB 12.3 11/29/2021  Hemoglobin above desired target Mircera received outpatient every 2 weeks.  3. .Secondary Hyperparathyroidism:  Lab Results  Component Value Date   PTH 357 (H) 10/05/2018   CALCIUM 8.0 (L) 11/29/2021   CAION 1.09 (L) 04/13/2020   PHOS 6.6 (H) 11/29/2021    Currently receiving cholecalciferol daily and sevelamer with meals. We will obtain updated labs with dialysis.  4. Community-acquired pneumonia due to COVID-19 virus.  Patient received Rocephin and azithromycin in ED.  Antibiotics completed. Supportive care  provided for symptoms The patient tested positive on arrival to the emergency department, positive test result received from outpatient dialysis clinic on 11/20/2021.  Currently obtaining copy of outpatient testing..   LOS: 4   3/13/20232:04 PM

## 2021-12-02 NOTE — TOC Progression Note (Addendum)
Transition of Care (TOC) - Progression Note  ? ? ?Patient Details  ?Name: [name] ?MRN: [id number] ?Date of Birth: [birth year] ? ?Transition of Care (TOC) CM/SW Contact  ?Candie Chroman, LCSW ?Phone Number: ?12/02/2021, 9:49 AM ? ?Clinical Narrative: [facility] has offered a bed. Admissions coordinator is checking to see if they have to go with the hospital COVID test or if they can use the one from the HD center if we can obtain those results. ? ?10:28 am: [facility] has to go by hospital COVID test. ? ?3:02 pm: [facility] has offered a bed. They may be able to use HD center COVID results. Left message for HD coordinator to see if she could obtain those. ? ?  ?Barriers to Discharge: SNF Pending bed offer ? ?Expected Discharge Plan and Services ?  ?  ?  ?  ?  ?                ?  ?  ?  ?  ?  ?  ?  ?  ?  ?  ? ? ?Social Determinants of Health (SDOH) Interventions ?  ? ?Readmission Risk Interventions ?Readmission Risk Prevention Plan 05/03/2021 12/11/2020 09/21/2020  ?Transportation Screening Complete Complete Complete  ?Medication Review Press photographer) Complete Complete Complete  ?PCP or Specialist appointment within 3-5 days of discharge - Complete Complete  ?[place] or Home Care Consult - Complete Complete  ?SW Recovery Care/Counseling Consult - Complete Complete  ?Palliative Care Screening Complete Not Applicable Complete  ?Skilled Nursing Facility - Not Complete Complete  ?SNF Comments - working on placement -  ?Some recent data might be hidden  ? ? ?

## 2021-12-02 NOTE — Progress Notes (Signed)
Physical Therapy Treatment ?Patient Details ?Name: [name] ?MRN: [id number] ?DOB: [birth year] ?Today's Date: 12/02/2021 ? ? ?History of Present Illness [name] is a 61 y.o. female with medical history significant for ESRD on HD TTS, DM, chronic diastolic heart failure, CAD, PVD, Chronic respiratory failure on home O2 at 2 L, who presents to the ED with a 7-10 day history of cough, chills, weakness, myalgia; admitted for management of PNA due to COVID-19.  Hospital course significant for RR call due to episode of decreased responsiveness; relieved with Narcan (after which opioids discontinued). ? ?  ?PT Comments  ? ? Pt ready for session.  To EOB with min guard and increased time using rails but able to do on her own.  Steady in sitting.  She is able to stand with min guard/a x 2 at bedside with walker for pericare by RN due to small BM.  After care,she is able to take 3 very small slow steps along EOB with min guard and RW.  Returns to supine with good effort to lift legs but does need min assist to get legs fully back on bed.  She is feeding herself chopped fruit then continues with breakfast after session as warm tray arrives and seems to do well with self feeding. ? ?Pt overall does quite well today with session with improved mobilty.  SNF remains appropriate for transition at discharge. ?  ?Recommendations for follow up therapy are one component of a multi-disciplinary discharge planning process, led by the attending physician.  Recommendations may be updated based on patient status, additional functional criteria and insurance authorization. ? ?Follow Up Recommendations ? Skilled nursing-short term rehab (<3 hours/day) ?  ?  ?Assistance Recommended at Discharge Frequent or constant Supervision/Assistance  ?Patient can return home with the following A lot of help with walking and/or transfers;A lot of help with bathing/dressing/bathroom;Help with stairs or ramp for entrance;Assist for transportation ?   ?Equipment Recommendations ?    ?  ?Recommendations for Other Services   ? ? ?  ?Precautions / Restrictions Precautions ?Precautions: Fall ?Precaution Comments: R chest perm-cath ?Restrictions ?Weight Bearing Restrictions: No  ?  ? ?Mobility ? Bed Mobility ?Overal bed mobility: Needs Assistance ?  ?  ?  ?Supine to sit: Min guard ?Sit to supine: Min assist ?  ?General bed mobility comments: increased time and use of rails but overall does well ?  ? ?Transfers ?Overall transfer level: Needs assistance ?Equipment used: Rolling walker (2 wheels) ?Transfers: Sit to/from Stand ?Sit to Stand: Min assist, +2 physical assistance ?  ?  ?  ?  ?  ?  ?  ? ?Ambulation/Gait ?Ambulation/Gait assistance: Min guard ?Gait Distance (Feet): 2 Feet ?Assistive device: Rolling walker (2 wheels) ?Gait Pattern/deviations: Step-to pattern ?Gait velocity: very slow ?  ?  ?General Gait Details: able to takes 3 small sidesteps along bed ? ? ?Stairs ?  ?  ?  ?  ?  ? ? ?Wheelchair Mobility ?  ? ?Modified Rankin (Stroke Patients Only) ?  ? ? ?  ?Balance Overall balance assessment: Needs assistance ?Sitting-balance support: Bilateral upper extremity supported, Feet supported ?Sitting balance-Leahy Scale: Fair ?  ?  ?Standing balance support: Bilateral upper extremity supported ?Standing balance-Leahy Scale: Fair ?  ?  ?  ?  ?  ?  ?  ?  ?  ?  ?  ?  ?  ? ?  ?Cognition Arousal/Alertness: Awake/alert ?Behavior During Therapy: [facility] for tasks assessed/performed ?Overall Cognitive Status: Within Functional  Limits for tasks assessed ?  ?  ?  ?  ?  ?  ?  ?  ?  ?  ?  ?  ?  ?  ?  ?  ?General Comments: tolerated well with no issues today ?  ?  ? ?  ?Exercises   ? ?  ?General Comments   ?  ?  ? ?Pertinent Vitals/Pain Pain Assessment ?Pain Assessment: Faces ?Faces Pain Scale: Hurts a little bit ?Pain Location: unable to state, appears generalized throughout body ?Pain Descriptors / Indicators: Grimacing, Guarding, Moaning ?Pain Intervention(s): Limited activity  within patient's tolerance, Monitored during session, Repositioned  ? ? ?Home Living   ?  ?  ?  ?  ?  ?  ?  ?  ?  ?   ?  ?Prior Function    ?  ?  ?   ? ?PT Goals (current goals can now be found in the care plan section) Progress towards PT goals: Progressing toward goals ? ?  ?Frequency ? ? ? Min 2X/week ? ? ? ?  ?PT Plan Current plan remains appropriate  ? ? ?Co-evaluation   ?  ?  ?  ?  ? ?  ?AM-PAC PT "6 Clicks" Mobility   ?Outcome Measure ? Help needed turning from your back to your side while in a flat bed without using bedrails?: A Little ?Help needed moving from lying on your back to sitting on the side of a flat bed without using bedrails?: A Little ?Help needed moving to and from a bed to a chair (including a wheelchair)?: A Little ?Help needed standing up from a chair using your arms (e.g., wheelchair or bedside chair)?: A Little ?Help needed to walk in hospital room?: A Lot ?Help needed climbing 3-5 steps with a railing? : Total ?6 Click Score: 15 ? ?  ?End of Session   ?Activity Tolerance: Patient tolerated treatment well ?Patient left: in bed;with call bell/phone within reach;with bed alarm set ?Nurse Communication: Mobility status ?PT Visit Diagnosis: Muscle weakness (generalized) (M62.81);Difficulty in walking, not elsewhere classified (R26.2);Pain ?  ? ? ?Time: [phone] ?PT Time Calculation (min) (ACUTE ONLY): 27 min ? ?Charges:  $Gait Training: 8-22 mins ?$Therapeutic Activity: 8-22 mins          ?          ? ?Chesley Noon, PTA ?12/02/21, 9:37 AM ? ?

## 2021-12-02 NOTE — NC FL2 (Signed)
[place] LEVEL OF CARE SCREENING TOOL     IDENTIFICATION  Patient Name: [name] Birthdate: 27-Apr-1961 Sex: female Admission Date (Current Location): 11/27/2021  [facility] and [place] Number:  [name] [id number] [place] and Address:  [facility], [address] [place], [place] [address]      Provider Number: [id number]  Attending Physician Name and Address:  Modena Jansky, MD  Relative Name and Phone Number:  Nallely, Yost)   [phone] [facility] Phone)    Current Level of Care: Hospital Recommended Level of Care: [place] Prior Approval Number:    Date Approved/Denied: 04/04/15 PASRR Number: [id number]  Discharge Plan: SNF    Current Diagnoses: Patient Active Problem List   Diagnosis Date Noted   Hyponatremia 11/27/2021   COVID-19 virus infection 11/27/2021   Bacterial pneumonia 11/27/2021   Acute GI bleeding 11/15/2021   Upper GI bleed 11/15/2021   Anemia of chronic kidney failure 05/03/2021   Thrombocytopenia ([place]) 05/03/2021   Severe sepsis with septic shock ([place]) 04/25/2021   HCAP (healthcare-associated pneumonia) 93/23/5573   Acute metabolic encephalopathy 22/10/5425   UTI (urinary tract infection) 03/29/2021   Chronic respiratory failure with hypoxia ([place]) 12/18/2020   Superior vena caval syndrome 12/14/2020   Chronic diastolic CHF (congestive heart failure) ([place]) 12/04/2020   Bradycardia 09/17/2020   Advanced care planning/counseling discussion    Discitis of thoracic region    Fluid overload 08/10/2020   HLD (hyperlipidemia) 08/10/2020   Chest pain 08/10/2020   CAD (coronary artery disease) 08/10/2020   GERD (gastroesophageal reflux disease) 08/10/2020   Obesity (BMI 30-39.9) 06/06/2020   Anemia in ESRD (end-stage renal disease) ([place]) 05/31/2020   Type 1 diabetes mellitus ([place]) 05/31/2020   End-stage renal disease ([place]) 05/31/2020   Hypertensive disorder 05/31/2020   Arm pain 05/21/2020    Cellulitis of right leg 05/02/2020   ESRD (end stage renal disease) ([place]) 04/13/2020   SOBOE (shortness of breath on exertion) 02/08/2020   Hematoma of arm, left, subsequent encounter 11/24/2019   Respiratory failure (HCC)    Shortness of breath    Complication of arteriovenous dialysis fistula 11/13/2019   PVD (peripheral vascular disease) ([place]) 11/13/2019   Severe anemia 11/13/2019   Pressure ulcer, stage 2 ([place]) 11/13/2019   ESRD on hemodialysis ([place])    Hemorrhagic shock ([place]) 11/11/2019   Leg pain 10/02/2019   CAP (community acquired pneumonia) 10/01/2019   Atherosclerosis of native arteries of extremity with intermittent claudication ([place]) 05/01/2019   Right-sided headache    COPD (chronic obstructive pulmonary disease) ([place]) 10/06/2018   Syncope 10/04/2018   Symptomatic anemia 06/18/2018   Gastroparesis due to DM ([place]) 03/15/7627   Complication of vascular access for dialysis 12/04/2017   Osteomyelitis ([place]) 09/30/2017   Carotid stenosis 06/18/2017   Bilateral carotid artery stenosis 06/16/2017   Benign essential HTN 05/19/2017   CKD (chronic kidney disease) stage 5, GFR less than 15 ml/min ([place]) 05/19/2017   Hyperlipidemia, mixed 05/19/2017   Shortness of breath 05/04/2017   Rash, skin 12/16/2016   Cellulitis of lower extremity 07/29/2016   Chronic venous insufficiency 07/29/2016   Lymphedema 07/29/2016   TIA (transient ischemic attack) 04/21/2016   Altered mental status 04/08/2016   Hyperammonemia ([place]) 04/08/2016   Elevated troponin 04/08/2016   Depression 04/08/2016   Depression, major, recurrent, severe with psychosis ([place]) 04/08/2016   Blood in stool    Intractable cyclical vomiting with nausea    Reflux esophagitis    Gastritis    Generalized  abdominal pain    Uncontrollable vomiting    Major depressive disorder, recurrent episode, moderate (HCC) 03/15/2016   Adjustment disorder with mixed anxiety and depressed mood 03/15/2016   Malnutrition of moderate  degree 12/01/2015   Renal mass    Dyspnea    Acute renal failure (HCC)    Respiratory failure ([place])    High temperature 11/14/2015   Encounter for central line placement    Encounter for orogastric (OG) tube placement    Nausea 11/12/2015   Hyperkalemia 10/03/2015   Diarrhea, unspecified 07/22/2015   Hypoglycemia 04/24/2015   Unresponsiveness 04/24/2015   Sinus bradycardia 04/24/2015   Hypothermia 04/24/2015   Acute hypoxemic respiratory failure ([place]) 04/24/2015   Acute on chronic diastolic CHF (congestive heart failure) ([place]) 04/05/2015   Diabetic gastroparesis ([place]) 04/05/2015   Hypokalemia 04/05/2015   Physical debility 04/05/2015   Acute pulmonary edema ([place]) 04/03/2015   Nausea and vomiting 04/03/2015   Hypoglycemia associated with diabetes ([place]) 04/03/2015   Anemia of chronic disease 04/03/2015   Secondary hyperparathyroidism ([place]) 04/03/2015   Pressure ulcer 04/02/2015   Acute respiratory failure with hypoxia (HCC) 04/01/2015   Adjustment disorder with anxiety 03/14/2015   Somatic symptom disorder, mild 03/08/2015   Coronary artery disease involving native coronary artery of native heart without angina pectoris    Nausea & vomiting 03/06/2015   Abdominal pain 03/06/2015   Diabetes mellitus, type II ([place]) 03/06/2015   HTN (hypertension) 03/06/2015   Gastroparesis 02/24/2015   Pleural effusion 02/19/2015   Bacteremia due to Enterococcus 02/19/2015   End-stage renal disease on hemodialysis ([place]) 02/19/2015   Diarrhea 08/01/2014    Orientation RESPIRATION BLADDER Height & Weight     Self, Time, Situation, Place  O2 (Nasal cannula 3 L) Continent Weight: 166 lb 3.6 oz (75.4 kg) Height:  '5\' 2"'$  (157.5 cm)  BEHAVIORAL SYMPTOMS/MOOD NEUROLOGICAL BOWEL NUTRITION STATUS   (None)  (None) Incontinent Diet (2 gram sodium)  AMBULATORY STATUS COMMUNICATION OF NEEDS Skin   Limited Assist Verbally Normal                       Personal Care Assistance Level of Assistance   Bathing, Feeding, Dressing Bathing Assistance: Maximum assistance Feeding assistance: Limited assistance Dressing Assistance: Maximum assistance   Functional Limitations Info  Sight, Hearing, Speech Sight Info: Adequate Hearing Info: Adequate Speech Info: Adequate    SPECIAL CARE FACTORS FREQUENCY  PT (By licensed PT), OT (By licensed OT)     PT Frequency: 5 x week OT Frequency: 5 x week            Contractures Contractures Info: Not present    Additional Factors Info  Code Status, Allergies, Isolation Precautions Code Status Info: Full code Allergies Info: Ace Inhibitors, Ativan (Lorazepam), Compazine (Prochlorperazine Edisylate), Sumatriptan Succinate, Zofran (Ondansetron), Losartan, Prochlorperazine, Reglan (Metoclopramide), Scopolamine, Tape, Tapentadol, Ultrasound Gel     Isolation Precautions Info: Airborne: Tested positive for COVID at HD center on 3/1. Tested positive on admission 3/8. Contact: ESBL     Current Medications (12/02/2021):  This is the current hospital active medication list Current Facility-Administered Medications  Medication Dose Route Frequency Provider Last Rate Last Admin   acetaminophen (TYLENOL) tablet 650 mg  650 mg Oral Q6H PRN Modena Jansky, MD   650 mg at 12/02/21 1308   albuterol (VENTOLIN HFA) 108 (90 Base) MCG/ACT inhaler 2 puff  2 puff Inhalation Q6H Athena Masse, MD   2 puff at 12/02/21 1457   amLODipine (  NORVASC) tablet 10 mg  10 mg Oral Daily Modena Jansky, MD   10 mg at 12/02/21 2426   aspirin EC tablet 81 mg  81 mg Oral Daily Vernell Leep D, MD   81 mg at 12/02/21 0857   atorvastatin (LIPITOR) tablet 20 mg  20 mg Oral QPM Hongalgi, Anand D, MD   20 mg at 12/01/21 1706   calcium carbonate (TUMS - dosed in mg elemental calcium) chewable tablet 500 mg of elemental calcium  500 mg of elemental calcium Oral Q6H PRN Modena Jansky, MD   500 mg of elemental calcium at 11/29/21 1857   Chlorhexidine Gluconate Cloth 2 % PADS  6 each  6 each Topical Q0600 Colon Flattery, NP   6 each at 12/02/21 0517   cholecalciferol (VITAMIN D3) tablet 1,000 Units  1,000 Units Oral Daily Modena Jansky, MD   1,000 Units at 12/02/21 0858   feeding supplement (BOOST / RESOURCE BREEZE) liquid 1 Container  1 Container Oral TID BM Modena Jansky, MD   1 Container at 12/02/21 1456   feeding supplement (NEPRO CARB STEADY) liquid 237 mL  237 mL Oral BID BM Hongalgi, Anand D, MD   237 mL at 12/02/21 0854   fluticasone (FLONASE) 50 MCG/ACT nasal spray 2 spray  2 spray Each Nare Daily PRN Modena Jansky, MD       gabapentin (NEURONTIN) capsule 100 mg  100 mg Oral QHS Vernell Leep D, MD   100 mg at 12/01/21 2119   guaiFENesin-dextromethorphan (ROBITUSSIN DM) 100-10 MG/5ML syrup 10 mL  10 mL Oral Q4H PRN Athena Masse, MD   10 mL at 12/01/21 2126   heparin injection 5,000 Units  5,000 Units Subcutaneous Q8H Athena Masse, MD   5,000 Units at 12/02/21 1456   menthol-cetylpyridinium (CEPACOL) lozenge 3 mg  1 lozenge Oral PRN Modena Jansky, MD   3 mg at 12/01/21 1707   mirtazapine (REMERON) tablet 7.5 mg  7.5 mg Oral QHS Hongalgi, Anand D, MD   7.5 mg at 12/01/21 2120   multivitamin (RENA-VIT) tablet 1 tablet  1 tablet Oral QHS Hongalgi, Lenis Dickinson, MD   1 tablet at 12/01/21 2120   naloxone [facility]) injection 0.4 mg  0.4 mg Intravenous PRN Vernell Leep D, MD   0.4 mg at 11/28/21 1400   nitroGLYCERIN (NITROSTAT) SL tablet 0.4 mg  0.4 mg Sublingual Q5 min PRN Hongalgi, Anand D, MD       pantoprazole (PROTONIX) EC tablet 40 mg  40 mg Oral BID Vernell Leep D, MD   40 mg at 12/02/21 0857   polyethylene glycol (MIRALAX / GLYCOLAX) packet 17 g  17 g Oral Daily PRN Modena Jansky, MD       promethazine (PHENERGAN) tablet 12.5 mg  12.5 mg Oral Q6H PRN Murlean Iba, MD   12.5 mg at 11/29/21 1725   sevelamer carbonate (RENVELA) tablet 800 mg  800 mg Oral TID WC & HS Hongalgi, Anand D, MD   800 mg at 12/02/21 1252   vitamin B-12  (CYANOCOBALAMIN) tablet 1,000 mcg  1,000 mcg Oral Daily Modena Jansky, MD   1,000 mcg at 12/02/21 8341     Discharge Medications: Please see discharge summary for a list of discharge medications.  Relevant Imaging Results:  Relevant Lab Results:   Additional Information SS#: [id number]   HD MWF [facility]  Candie Chroman, LCSW

## 2021-12-02 NOTE — Progress Notes (Signed)
Occupational Therapy Treatment ?Patient Details ?Name: [name] ?MRN: [id number] ?DOB: 07-Feb-1961 ?Today's Date: 12/02/2021 ? ? ?History of present illness [name] is a 61 y.o. female with medical history significant for ESRD on HD TTS, DM, chronic diastolic heart failure, CAD, PVD, Chronic respiratory failure on home O2 at 2 L, who presents to the ED with a 7-10 day history of cough, chills, weakness, myalgia; admitted for management of PNA due to COVID-19.  Hospital course significant for RR call due to episode of decreased responsiveness; relieved with Narcan (after which opioids discontinued). ?  ?OT comments ? Pt seen for OT treatment on this date (note: MD submitting new OT orders to evaluate pt's self feeding skills). Upon arrival to room, pt awake and seated upright in bed with lunch present and already set-up. Pt reported that she was eating lunch prior to this author's arrival. During session, pt was observed to take 5 bites of salad with fork and drink from cup (via straw), requiring only SET-UP assist and increased time (pt reports she is a slow eater). No food/drink spillage noted. Pt also performed oral care, requiring only set-up assist. Unable to attempt further mobility/ADLs d/t transport team arriving to take pt to HD. Pt is making good progress toward goals and continues to benefit from skilled OT services to maximize return to PLOF and minimize risk of future falls, injury, caregiver burden, and readmission. Will continue to follow POC. Discharge recommendation remains appropriate.    ? ?Recommendations for follow up therapy are one component of a multi-disciplinary discharge planning process, led by the attending physician.  Recommendations may be updated based on patient status, additional functional criteria and insurance authorization. ?   ?Follow Up Recommendations ? Skilled nursing-short term rehab (<3 hours/day)  ?  ?Assistance Recommended at Discharge Frequent or constant  Supervision/Assistance  ?Patient can return home with the following ? A lot of help with walking and/or transfers;A lot of help with bathing/dressing/bathroom;Assist for transportation;Assistance with cooking/housework ?  ?Equipment Recommendations ? Other (comment) (defer to next venue of care)  ?  ?   ?Precautions / Restrictions Precautions ?Precautions: Fall ?Precaution Comments: R chest perm-cath ?Restrictions ?Weight Bearing Restrictions: No  ? ? ?  ? ?Mobility Bed Mobility ?  ?  ?  ?  ?  ?  ?  ?General bed mobility comments: Unable to attempt this date d/t transport team arriving to take pt to HD ?  ? ?Transfers ?  ?  ?  ?  ?  ?  ?  ?  ?  ?  ?  ?  ?   ? ?ADL either performed or assessed with clinical judgement  ? ?ADL Overall ADL's : Needs assistance/impaired ?Eating/Feeding: Supervision/ safety;Set up ?Eating/Feeding Details (indicate cue type and reason): With increased time, pt able to take 5 bites of food and drink from cup without any spillage observed ?Grooming: Wash/dry face;Oral care;Set up ?  ?  ?  ?  ?  ?  ?  ?  ?  ?  ?  ?  ?  ?  ?  ?  ?  ?  ? ? ? ?Cognition Arousal/Alertness: Awake/alert ?Behavior During Therapy: Flat affect ?Overall Cognitive Status: Difficult to assess ?  ?  ?  ?  ?  ?  ?  ?  ?  ?  ?  ?  ?  ?  ?  ?  ?  ?  ?  ?   ?   ?   ?   ? ? ?  Pertinent Vitals/ Pain       Pain Assessment ?Pain Assessment: No/denies pain ? ?   ?   ? ?Frequency ? Min 2X/week  ? ? ? ? ?  ?Progress Toward Goals ? ?OT Goals(current goals can now be found in the care plan section) ? Progress towards OT goals: Progressing toward goals ? ?Acute Rehab OT Goals ?Patient Stated Goal: to eat ?OT Goal Formulation: With patient ?Time For Goal Achievement: 12/12/21 ?Potential to Achieve Goals: Good  ?Plan Discharge plan remains appropriate;Frequency remains appropriate   ? ?   ?AM-PAC OT "6 Clicks" Daily Activity     ?Outcome Measure ? ? Help from another person eating meals?: A Little ?Help from another person taking care of  personal grooming?: A Lot ?Help from another person toileting, which includes using toliet, bedpan, or urinal?: A Lot ?Help from another person bathing (including washing, rinsing, drying)?: A Lot ?Help from another person to put on and taking off regular upper body clothing?: A Lot ?Help from another person to put on and taking off regular lower body clothing?: A Lot ?6 Click Score: 13 ? ?  ?End of Session Equipment Utilized During Treatment: Oxygen ? ?OT Visit Diagnosis: Unsteadiness on feet (R26.81);Muscle weakness (generalized) (M62.81) ?  ?Activity Tolerance Patient tolerated treatment well ?  ?Patient Left in bed;with call bell/phone within reach;with bed alarm set;Other (comment) (with transport team) ?  ?Nurse Communication Mobility status ?  ? ?   ? ?Time: 9030-0923 ?OT Time Calculation (min): 23 min ? ?Charges: OT General Charges ?$OT Visit: 1 Visit ?OT Treatments ?$Self Care/Home Management : 23-37 mins ? ?Fredirick Maudlin, OTR/L ?[place] ? ?

## 2021-12-02 NOTE — Progress Notes (Signed)
PROGRESS NOTE   [name]  UUE:[id number]    DOB: 08/22/1961    DOA: 11/27/2021  PCP: Denton Lank, MD   I have briefly reviewed patients previous medical records in [facility].  Chief Complaint  Patient presents with   Generalized Body Aches    Hospital Course:  61 year old female with medical history significant for ESRD on TTS HD, type II DM, chronic diastolic CHF, CAD, PAD, chronic respiratory failure on home oxygen 2 L/min, COPD, anemia, HLD, GERD, hard of hearing, presented to the ED with 7 to 10 days of cough, chills, weakness and myalgia.  Reported testing positive for COVID 19 at the dialysis center more than a week ago.  Denies dyspnea or chest pain.  Developed self-limiting diarrhea 3 days PTA without nausea, vomiting or abdominal pain.  Admitted for bacterial pneumonia superimposed on recent COVID-19 infection.  Nephrology consulted for HD needs.  On 3/9, patient obtunded, rapid response initiated, improved after a dose of Narcan, opioids discontinued.  Completed 5 days course of IV antibiotics for pneumonia.  Clinically improved.  Medically optimized for DC to SNF pending bed/insurance.  TOC consulted   Assessment & Plan:  Principal Problem:   Bacterial pneumonia Active Problems:   Unresponsiveness   COVID-19 virus infection   Chronic respiratory failure with hypoxia (HCC)   End-stage renal disease on hemodialysis (HCC)   Hyponatremia   HTN (hypertension)   Diabetes mellitus, type II (HCC)   Chronic diastolic CHF (congestive heart failure) (HCC)   Coronary artery disease involving native coronary artery of native heart without angina pectoris   Physical debility   Assessment and Plan: * Bacterial pneumonia Based on symptomatology including productive cough and chest x-ray findings. Procalcitonin elevated but unclear significance in the context of ESRD patient. Completed total 5 days of IV antibiotics including ceftriaxone and azithromycin Blood cultures  negative to date.  Sputum culture showed E. coli, MDR except to gentamicin, imipenem and Bactrim As per communication with pharmacy, patient has clinically improved despite current antibiotics not covering for E. coli and hence completed ceftriaxone.  Option for treating E. coli would be Bactrim, not appropriate in dialysis patient Ongoing supportive care and aggressive pulmonary toilet.  Strict aspiration precautions. Ongoing bothersome cough.  Discussed with patient and daughter-in-law at bedside that sometimes this can take longer to resolve.  Will review chest x-ray but that can also lag behind clinical improvement.  Unresponsiveness Acute toxic metabolic encephalopathy On 3/9, RN alerted that patient was barely arousable, respiratory rate of 10. Ordered Narcan, ABG and advised to activate rapid response team. Improved after dose of Narcan. Likely related to Vicodin, Tussionex complicating dialysis patient. Discontinued all opioids and minimize use of same. Mental status has significantly improved over the last couple of days.  Patient has not reported much pain. Encephalopathy resolved.  COVID-19 virus infection Per patient report, tested positive for COVID-19 at the outpatient dialysis center more than a week ago. Hypoxia no worse than her baseline and requiring 2 L/min [place] oxygen. Discussed in detail with pharmacy, given duration of her COVID infection, no antivirals initiated.  No indication for steroids in the absence of worsening hypoxia. Supportive treatment.  Chronic respiratory failure with hypoxia (HCC) 2 L/min O2 requirement at baseline. Appears to be at baseline.  Unclear why she is on 4 L well saturating in the high 90s.  Needs to be titrated down to her home level. ABG this afternoon during unresponsive episode, pH 7.35, PCO2 52, PO2 108 and oxygen  saturation 98.9  End-stage renal disease on hemodialysis [facility]) Nephrology consulted for HD needs. Going for HD  today.  Hyponatremia Sodium 129 but at baseline Her baseline and likely related to ESRD and HD. Management per nephrology.  HTN (hypertension) Continue amlodipine, hydralazine Reasonably controlled.  Diabetes mellitus, type II ([place]) Had some hypoglycemic episodes and CBGs monitored closely, no further episodes. Not on insulins.   A1c well controlled at 6.2 on 11/15/2021 DC CBGs  Chronic diastolic CHF (congestive heart failure) ([place]) Compensated.  Continue furosemide. Volume management across HD.  Physical debility Patient is profoundly weak. As per son's report, not even able to feed herself.  However on my exam, has at least 4+ by 5 power in bilateral upper extremities and so unsure why she cannot feed herself.  Requested RN and NP to encourage patient to feed herself and supervised same. PT input appreciated and recommend SNF.  Son agreeable. TOC consulted for SNF placement  Coronary artery disease involving native coronary artery of native heart without angina pectoris Continue aspirin and atorvastatin with nitroglycerin as needed chest pain   Body mass index is 31.41 kg/m.     DVT prophylaxis: heparin injection 5,000 Units Start: 11/27/21 0600     Code Status: Full Code:  Family Communication: Daughter-in-law at bedside. Disposition:  Status is: inpatient.    Consultants:   Nephrology  Procedures:   HD  Antimicrobials:   As above   Subjective:  Ongoing cough, mostly nonproductive.  Wants some heart food.  Reports some leg pain.  Denies any other complaints.  Aware that she is medically ready for DC to SNF pending bed.  Objective:   Vitals:   12/02/21 0516 12/02/21 0744 12/02/21 1438 12/02/21 1530  BP: (!) 155/62 (!) 150/58 (!) 157/69 (!) 154/64  Pulse: 65 70 67 66  Resp: '16 16 16 19  '$ Temp: 98.1 F (36.7 C) 98 F (36.7 C) 98.4 F (36.9 C) 97.9 F (36.6 C)  TempSrc: Oral Oral Oral Oral  SpO2: 100% 100% 100%   Weight:    77.9 kg  Height:         General exam: Middle-age female, moderately built and nourished lying comfortably propped up in bed.  DIL by bedside. Respiratory system: Very occasional basal crackles but otherwise clear to auscultation without wheezing, rhonchi.  No increased work of breathing.  No significant change/stable. Cardiovascular system: S1 and S2 heard, RRR.  No JVD, murmurs or pedal edema.  No change/stable. Gastrointestinal system: Abdomen is nondistended, soft and nontender. No organomegaly or masses felt. Normal bowel sounds heard. Central nervous system: Alert and oriented.  Extremely hard of hearing makes it difficult to communicate.  Can only hear out of her right ear.  No focal neurological deficits. Extremities: Symmetric 5 x 5 power.  Bilateral legs with chronic changes including lichenification of skin.  No acute findings. Skin: No rashes, lesions or ulcers Psychiatry: Judgement and insight impaired but improved.  Mood & affect difficult to assess.    Data Reviewed:   I have personally reviewed following labs and imaging studies   CBC: Recent Labs  Lab 11/27/21 0354 11/29/21 1405  WBC 7.5 4.4  NEUTROABS 4.3  --   HGB 12.1 12.3  HCT 39.5 39.1  MCV 88.6 88.3  PLT 319 956    Basic Metabolic Panel: Recent Labs  Lab 11/27/21 0354 11/29/21 1405  NA 129* 131*  K 4.7 4.2  CL 89* 93*  CO2 25 27  GLUCOSE 99 78  BUN 33*  24*  CREATININE 6.37* 6.10*  CALCIUM 7.9* 8.0*  MG 1.9  --   PHOS  --  6.6*    Liver Function Tests: Recent Labs  Lab 11/27/21 0354 11/29/21 1405  AST 12*  --   ALT 8  --   ALKPHOS 103  --   BILITOT 0.7  --   PROT 6.8  --   ALBUMIN 3.2* 2.8*    CBG: Recent Labs  Lab 12/01/21 0902 12/01/21 1140 12/01/21 1600  GLUCAP 127* 120* 136*    Microbiology Studies:   Recent Results (from the past 240 hour(s))  Resp Panel by RT-PCR (Flu A&B, Covid) Nasopharyngeal Swab     Status: Abnormal   Collection Time: 11/27/21  3:54 AM   Specimen: Nasopharyngeal  Swab; Nasopharyngeal(NP) swabs in vial transport medium  Result Value Ref Range Status   SARS Coronavirus 2 by RT PCR POSITIVE (A) NEGATIVE Final    Comment: (NOTE) SARS-CoV-2 target nucleic acids are DETECTED.  The SARS-CoV-2 RNA is generally detectable in upper respiratory specimens during the acute phase of infection. Positive results are indicative of the presence of the identified virus, but do not rule out bacterial infection or co-infection with other pathogens not detected by the test. Clinical correlation with patient history and other diagnostic information is necessary to determine patient infection status. The expected result is Negative.  Fact Sheet for Patients: EntrepreneurPulse.com.au  Fact Sheet for Healthcare Providers: IncredibleEmployment.be  This test is not yet approved or cleared by the [place] FDA and  has been authorized for detection and/or diagnosis of SARS-CoV-2 by FDA under an Emergency Use Authorization (EUA).  This EUA will remain in effect (meaning this test can be used) for the duration of  the COVID-19 declaration under Section 564(b)(1) of the A ct, 21 U.S.C. section 360bbb-3(b)(1), unless the authorization is terminated or revoked sooner.     Influenza A by PCR NEGATIVE NEGATIVE Final   Influenza B by PCR NEGATIVE NEGATIVE Final    Comment: (NOTE) The Xpert Xpress SARS-CoV-2/FLU/RSV plus assay is intended as an aid in the diagnosis of influenza from Nasopharyngeal swab specimens and should not be used as a sole basis for treatment. Nasal washings and aspirates are unacceptable for Xpert Xpress SARS-CoV-2/FLU/RSV testing.  Fact Sheet for Patients: EntrepreneurPulse.com.au  Fact Sheet for Healthcare Providers: IncredibleEmployment.be  This test is not yet approved or cleared by the [place] FDA and has been authorized for detection and/or diagnosis of  SARS-CoV-2 by FDA under an Emergency Use Authorization (EUA). This EUA will remain in effect (meaning this test can be used) for the duration of the COVID-19 declaration under Section 564(b)(1) of the Act, 21 U.S.C. section 360bbb-3(b)(1), unless the authorization is terminated or revoked.  Performed at [facility], [address] [place], [place] [address]   Blood culture (single)     Status: None   Collection Time: 11/27/21  5:04 AM   Specimen: BLOOD  Result Value Ref Range Status   Specimen Description BLOOD LEFT FA  Final   Special Requests   Final    BOTTLES DRAWN AEROBIC AND ANAEROBIC Blood Culture adequate volume   Culture   Final    NO GROWTH 5 DAYS Performed at [facility], [address], [place], [place] [address]    Report Status 12/02/2021 FINAL  Final  Expectorated Sputum Assessment w Gram Stain, Rflx to Resp Cult     Status: None   Collection Time: 11/27/21  6:57 AM   Specimen:  Expectorated Sputum  Result Value Ref Range Status   Specimen Description EXPECTORATED SPUTUM  Final   Special Requests NONE  Final   Sputum evaluation   Final    THIS SPECIMEN IS ACCEPTABLE FOR SPUTUM CULTURE Performed at [facility], [address] [place], [place] [address]    Report Status 11/27/2021 FINAL  Final  Culture, Respiratory w Gram Stain     Status: None   Collection Time: 11/27/21  6:57 AM  Result Value Ref Range Status   Specimen Description   Final    EXPECTORATED SPUTUM Performed at [facility], [address] [place], [place] [address]    Special Requests   Final    NONE Reflexed from [id number] Performed at [facility], [place], [place] [address]    Gram Stain   Final    MODERATE SQUAMOUS EPITHELIAL CELLS PRESENT FEW WBC PRESENT,BOTH PMN AND MONONUCLEAR FEW BUDDING YEAST SEEN FEW GRAM POSITIVE COCCI FEW GRAM NEGATIVE RODS Performed at [facility], [place] [address] [place], [place]  [address]    Culture   Final    ABUNDANT ESCHERICHIA COLI Confirmed Extended Spectrum Beta-Lactamase Producer (ESBL).  In bloodstream infections from ESBL organisms, carbapenems are preferred over piperacillin/tazobactam. They are shown to have a lower risk of mortality.    Report Status 11/30/2021 FINAL  Final   Organism ID, Bacteria ESCHERICHIA COLI  Final      Susceptibility   Escherichia coli - MIC*    AMPICILLIN >=32 RESISTANT Resistant     CEFAZOLIN >=64 RESISTANT Resistant     CEFEPIME >=32 RESISTANT Resistant     CEFTAZIDIME RESISTANT Resistant     CEFTRIAXONE >=64 RESISTANT Resistant     CIPROFLOXACIN >=4 RESISTANT Resistant     GENTAMICIN <=1 SENSITIVE Sensitive     IMIPENEM <=0.25 SENSITIVE Sensitive     TRIMETH/SULFA <=20 SENSITIVE Sensitive     AMPICILLIN/SULBACTAM >=32 RESISTANT Resistant     PIP/TAZO 64 INTERMEDIATE Intermediate     * ABUNDANT ESCHERICHIA COLI    Radiology Studies:  No results found.  Scheduled Meds:    albuterol  2 puff Inhalation Q6H   amLODipine  10 mg Oral Daily   aspirin EC  81 mg Oral Daily   atorvastatin  20 mg Oral QPM   Chlorhexidine Gluconate Cloth  6 each Topical Q0600   cholecalciferol  1,000 Units Oral Daily   feeding supplement  1 Container Oral TID BM   feeding supplement (NEPRO CARB STEADY)  237 mL Oral BID BM   gabapentin  100 mg Oral QHS   heparin  5,000 Units Subcutaneous Q8H   mirtazapine  7.5 mg Oral QHS   multivitamin  1 tablet Oral QHS   pantoprazole  40 mg Oral BID   sevelamer carbonate  800 mg Oral TID WC & HS   cyanocobalamin  1,000 mcg Oral Daily    Continuous Infusions:       LOS: 4 days     Vernell Leep, MD,  FACP, [facility], [facility], [facility] (Care Management Physician Certified) [place]  To contact the attending provider between 7A-7P or the covering provider during after hours 7P-7A, please log into the web site [email or web address] and access using universal [place]  password for that web site. If you do not have the password, please call the hospital operator.  12/02/2021, 3:50 PM

## 2021-12-02 NOTE — Progress Notes (Signed)
Hemodialysis Post Treatment Note: ? ?Tx date:12/02/2021 ?Tx time:2.5 hours ?Access: right cvc ?UF Removed: 574m ? ?Note: ? ?15:45 HD started  ? ?16:00 patient complained of head ache but unable to tell what level of pain. Checked MAR, pt already received Tylenol prior to coming dialysis and it is not yet due. Also, patient complained of nausea. PRN med checked, Phenergan 12.'5mg'$  given PO, See MAR. UF goal net decreased to 0.5L from 1L. Moreover, patient wanted her inhaler. Primary nurse was made aware and she said she will come to dialysis to give her med and she did.  ? ?17:00 Patient denies nausea. ? ?17:15 patient denies head ache ? ?18:28 HD completed. Patient has no complaints. Asymptomatic. ? ? ? ? ?  ?

## 2021-12-03 ENCOUNTER — Inpatient Hospital Stay: Payer: Medicare Other

## 2021-12-03 DIAGNOSIS — I2510 Atherosclerotic heart disease of native coronary artery without angina pectoris: Secondary | ICD-10-CM

## 2021-12-03 DIAGNOSIS — H903 Sensorineural hearing loss, bilateral: Secondary | ICD-10-CM

## 2021-12-03 DIAGNOSIS — J9611 Chronic respiratory failure with hypoxia: Secondary | ICD-10-CM

## 2021-12-03 DIAGNOSIS — H833X1 Noise effects on right inner ear: Secondary | ICD-10-CM

## 2021-12-03 DIAGNOSIS — Z2239 Carrier of other specified bacterial diseases: Secondary | ICD-10-CM

## 2021-12-03 DIAGNOSIS — I501 Left ventricular failure: Secondary | ICD-10-CM

## 2021-12-03 DIAGNOSIS — I110 Hypertensive heart disease with heart failure: Secondary | ICD-10-CM

## 2021-12-03 DIAGNOSIS — I5032 Chronic diastolic (congestive) heart failure: Secondary | ICD-10-CM

## 2021-12-03 LAB — RENAL FUNCTION PANEL
Albumin: 2.9 g/dL — ABNORMAL LOW (ref 3.5–5.0)
Anion gap: 9 (ref 5–15)
BUN: 12 mg/dL (ref 8–23)
CO2: 29 mmol/L (ref 22–32)
Calcium: 8.2 mg/dL — ABNORMAL LOW (ref 8.9–10.3)
Chloride: 92 mmol/L — ABNORMAL LOW (ref 98–111)
Creatinine, Ser: 4.32 mg/dL — ABNORMAL HIGH (ref 0.44–1.00)
GFR, Estimated: 11 mL/min — ABNORMAL LOW (ref >60)
Glucose, Bld: 161 mg/dL — ABNORMAL HIGH (ref 70–99)
Phosphorus: 4.9 mg/dL — ABNORMAL HIGH (ref 2.5–4.6)
Potassium: 3.5 mmol/L (ref 3.5–5.1)
Sodium: 130 mmol/L — ABNORMAL LOW (ref 135–145)

## 2021-12-03 LAB — C-REACTIVE PROTEIN: CRP: 2.6 mg/dL — ABNORMAL HIGH (ref <1.0)

## 2021-12-03 NOTE — Progress Notes (Signed)
?[facility]  ?ROUNDING NOTE  ? ?Subjective:  ? ?[name] is a 61 year old female with past medical history including diabetes, chronic diastolic heart failure, PVD, CAD, chronic respiratory failure with 2 L oxygen used at home, and end-stage renal disease on hemodialysis.  Patient presents to the emergency department with complaints of diarrhea, nausea, weakness and chills.  Patient has been admitted for under observation for Myalgia [M79.10] ?Pneumonia [J18.9] ?Community acquired pneumonia of left lower lobe of lung [J18.9] ?Bacterial pneumonia [J15.9] ? ?Patient is known to our practice and receives outpatient dialysis at [facility] on a MWF schedule, supervised by Dr Candiss Norse.  ? ?Patient is resting comfortably ?Continues to complain of  weakness ?Headache has improved ? ?Objective:  ?Vital signs in last 24 hours:  ?Temp:  [97.9 ?F (36.6 ?C)-99 ?F (37.2 ?C)] 98.5 ?F (36.9 ?C) (03/14 0623) ?Pulse Rate:  [66-73] 71 (03/14 0833) ?Resp:  [11-20] 16 (03/14 7628) ?BP: (153-172)/(35-79) 166/71 (03/14 3151) ?SpO2:  [96 %-100 %] 100 % (03/14 0833) ?Weight:  [77.3 kg-77.9 kg] 77.3 kg (03/13 1830) ? ?Weight change:  ?Filed Weights  ? 11/29/21 1641 12/02/21 1530 12/02/21 1830  ?Weight: 75.4 kg 77.9 kg 77.3 kg  ? ? ?Intake/Output: ?I/O last 3 completed shifts: ?In: -  ?Out: 500 [Other:500] ?  ?Intake/Output this shift: ? No intake/output data recorded. ? ?Physical Exam: ?General: NAD  ?Head: Normocephalic, atraumatic. Moist oral mucosal membranes  ?Eyes: Anicteric  ?Lungs:  Basilar crackles, normal effort, 3 L [place]  ?Heart: Regular rate and rhythm  ?Abdomen:  Soft, nontender  ?Extremities: Trace peripheral edema, facial edema  ?Neurologic: Nonfocal, moving all four extremities  ?Skin: No lesions  ?Access: Right PermCath  ? ? ?Basic Metabolic Panel: ?Recent Labs  ?Lab 11/27/21 ?0354 11/29/21 ?1405 12/03/21 ?7616  ?NA 129* 131* 130*  ?K 4.7 4.2 3.5  ?CL 89* 93* 92*  ?CO2 '25 27 29  '$ ?GLUCOSE 99 78 161*  ?BUN 33*  24* 12  ?CREATININE 6.37* 6.10* 4.32*  ?CALCIUM 7.9* 8.0* 8.2*  ?MG 1.9  --   --   ?PHOS  --  6.6* 4.9*  ? ? ? ?Liver Function Tests: ?Recent Labs  ?Lab 11/27/21 ?0354 11/29/21 ?1405 12/03/21 ?0737  ?AST 12*  --   --   ?ALT 8  --   --   ?ALKPHOS 103  --   --   ?BILITOT 0.7  --   --   ?PROT 6.8  --   --   ?ALBUMIN 3.2* 2.8* 2.9*  ? ? ?No results for input(s): LIPASE, AMYLASE in the last 168 hours. ?No results for input(s): AMMONIA in the last 168 hours. ? ?CBC: ?Recent Labs  ?Lab 11/27/21 ?0354 11/29/21 ?1405  ?WBC 7.5 4.4  ?NEUTROABS 4.3  --   ?HGB 12.1 12.3  ?HCT 39.5 39.1  ?MCV 88.6 88.3  ?PLT 319 282  ? ? ? ?Cardiac Enzymes: ?No results for input(s): CKTOTAL, CKMB, CKMBINDEX, TROPONINI in the last 168 hours. ? ?BNP: ?Invalid input(s): POCBNP ? ?CBG: ?Recent Labs  ?Lab 11/30/21 ?1627 12/01/21 ?0105 12/01/21 ?0902 12/01/21 ?1140 12/01/21 ?1600  ?GLUCAP 213* 184* 127* 120* 136*  ? ? ? ?Microbiology: ?Results for orders placed or performed during the hospital encounter of 11/27/21  ?Resp Panel by RT-PCR (Flu A&B, Covid) Nasopharyngeal Swab     Status: Abnormal  ? Collection Time: 11/27/21  3:54 AM  ? Specimen: Nasopharyngeal Swab; Nasopharyngeal(NP) swabs in vial transport medium  ?Result Value Ref Range Status  ? SARS Coronavirus  2 by RT PCR POSITIVE (A) NEGATIVE Final  ?  Comment: (NOTE) ?SARS-CoV-2 target nucleic acids are DETECTED. ? ?The SARS-CoV-2 RNA is generally detectable in upper respiratory ?specimens during the acute phase of infection. Positive results are ?indicative of the presence of the identified virus, but do not rule ?out bacterial infection or co-infection with other pathogens not ?detected by the test. Clinical correlation with patient history and ?other diagnostic information is necessary to determine patient ?infection status. The expected result is Negative. ? ?Fact Sheet for Patients: ?EntrepreneurPulse.com.au ? ?Fact Sheet for Healthcare  Providers: ?IncredibleEmployment.be ? ?This test is not yet approved or cleared by the [place] FDA and  ?has been authorized for detection and/or diagnosis of SARS-CoV-2 by ?FDA under an Emergency Use Authorization (EUA).  This EUA will ?remain in effect (meaning this test can be used) for the duration of  ?the COVID-19 declaration under Section 564(b)(1) of the A ct, 21 ?U.S.C. section 360bbb-3(b)(1), unless the authorization is ?terminated or revoked sooner. ? ?  ? Influenza A by PCR NEGATIVE NEGATIVE Final  ? Influenza B by PCR NEGATIVE NEGATIVE Final  ?  Comment: (NOTE) ?The Xpert Xpress SARS-CoV-2/FLU/RSV plus assay is intended as an aid ?in the diagnosis of influenza from Nasopharyngeal swab specimens and ?should not be used as a sole basis for treatment. Nasal washings and ?aspirates are unacceptable for Xpert Xpress SARS-CoV-2/FLU/RSV ?testing. ? ?Fact Sheet for Patients: ?EntrepreneurPulse.com.au ? ?Fact Sheet for Healthcare Providers: ?IncredibleEmployment.be ? ?This test is not yet approved or cleared by the [place] FDA and ?has been authorized for detection and/or diagnosis of SARS-CoV-2 by ?FDA under an Emergency Use Authorization (EUA). This EUA will remain ?in effect (meaning this test can be used) for the duration of the ?COVID-19 declaration under Section 564(b)(1) of the Act, 21 U.S.C. ?section 360bbb-3(b)(1), unless the authorization is terminated or ?revoked. ? ?Performed at [facility], [place], ?[place] [address] ?  ?Blood culture (single)     Status: None  ? Collection Time: 11/27/21  5:04 AM  ? Specimen: BLOOD  ?Result Value Ref Range Status  ? Specimen Description BLOOD LEFT FA  Final  ? Special Requests   Final  ?  BOTTLES DRAWN AEROBIC AND ANAEROBIC Blood Culture adequate volume  ? Culture   Final  ?  NO GROWTH 5 DAYS ?Performed at [facility], [address], [place], [place] [address] ?   ? Report Status 12/02/2021 FINAL  Final  ?Expectorated Sputum Assessment w Gram Stain, Rflx to Resp Cult     Status: None  ? Collection Time: 11/27/21  6:57 AM  ? Specimen: Expectorated Sputum  ?Result Value Ref Range Status  ? Specimen Description EXPECTORATED SPUTUM  Final  ? Special Requests NONE  Final  ? Sputum evaluation   Final  ?  THIS SPECIMEN IS ACCEPTABLE FOR SPUTUM CULTURE ?Performed at [facility], [address] [place], [place] [address] ?  ? Report Status 11/27/2021 FINAL  Final  ?Culture, Respiratory w Gram Stain     Status: None  ? Collection Time: 11/27/21  6:57 AM  ?Result Value Ref Range Status  ? Specimen Description   Final  ?  EXPECTORATED SPUTUM ?Performed at [facility], [address], [place], [place] [address] ?  ? Special Requests   Final  ?  NONE Reflexed from [id number] ?Performed at [facility], [address] [place], [place] [address] ?  ? Gram Stain   Final  ?  MODERATE SQUAMOUS EPITHELIAL CELLS PRESENT ?FEW WBC PRESENT,BOTH PMN AND MONONUCLEAR ?FEW BUDDING YEAST SEEN ?FEW GRAM POSITIVE COCCI ?FEW GRAM NEGATIVE RODS ?Performed at [facility], [place] [address], [place], [place] [address] ?  ? Culture   Final  ?  ABUNDANT ESCHERICHIA COLI ?Confirmed Extended Spectrum Beta-Lactamase Producer (ESBL).  In bloodstream infections from ESBL organisms, carbapenems are preferred over piperacillin/tazobactam. They are shown to have a lower risk of mortality. ?  ? Report Status 11/30/2021 FINAL  Final  ? Organism ID, Bacteria ESCHERICHIA COLI  Final  ?    Susceptibility  ? Escherichia coli - MIC*  ?  AMPICILLIN >=32 RESISTANT Resistant   ?  CEFAZOLIN >=64 RESISTANT Resistant   ?  CEFEPIME >=32 RESISTANT Resistant   ?  CEFTAZIDIME RESISTANT Resistant   ?  CEFTRIAXONE >=64 RESISTANT Resistant   ?  CIPROFLOXACIN >=4 RESISTANT Resistant   ?  GENTAMICIN <=1 SENSITIVE Sensitive   ?  IMIPENEM <=0.25 SENSITIVE Sensitive   ?  TRIMETH/SULFA <=20 SENSITIVE Sensitive    ?  AMPICILLIN/SULBACTAM >=32 RESISTANT Resistant   ?  PIP/TAZO 64 INTERMEDIATE Intermediate   ?  * ABUNDANT ESCHERICHIA COLI  ? ? ?Coagulation Studies: ?No results for input(s): LABPROT, INR in the last 72 hours. ? ?Urinalysis: ?No res

## 2021-12-03 NOTE — Progress Notes (Signed)
Physical Therapy Treatment ?Patient Details ?Name: [name] ?MRN: [id number] ?DOB: 03/27/1961 ?Today's Date: 12/03/2021 ? ? ?History of Present Illness [name] is a 61 y.o. female with medical history significant for ESRD on HD TTS, DM, chronic diastolic heart failure, CAD, PVD, Chronic respiratory failure on home O2 at 2 L, who presents to the ED with a 7-10 day history of cough, chills, weakness, myalgia; admitted for management of PNA due to COVID-19.  Hospital course significant for RR call due to episode of decreased responsiveness; relieved with Narcan (after which opioids discontinued). ? ?  ?PT Comments  ? ? Pt is awake and alert resting in bed upon PT entrance into room for evaluation today. Pt denies any c/o pain and is willing to work w/ PT today. She is able to perform all bed mobility w/ CGA for supine to sit and w/ minA for sit to supine; assistance was provided for LE transfer back into bed due to increase LE weakness. Once seated EOB she is able to progress to standing w/ minA using RW; increased time was required to stand fully due to Pt report of "I got to move slow cause of all my back pain". She was able to take multiple side steps along the edge of the bed for 2 bouts and then she requested to go look out the window. She was able to return to bed and sit EOB to perform some LE exercises (see "General LE Exercise" section) w/o any complaints. Pt returned to bed w/ all needs within reach prior to PT exiting room. Pt will benefit from continued skilled PT in order to increase LE strength, improve mobility/balance/gait, and restore PLOF. Current discharge recommendation remains appropriate due to the level of assistance required by the patient to ensure safety and improve overall function. ?  ?Recommendations for follow up therapy are one component of a multi-disciplinary discharge planning process, led by the attending physician.  Recommendations may be updated based on patient status,  additional functional criteria and insurance authorization. ? ?Follow Up Recommendations ? Skilled nursing-short term rehab (<3 hours/day) ?  ?  ?Assistance Recommended at Discharge Frequent or constant Supervision/Assistance  ?Patient can return home with the following A lot of help with walking and/or transfers;A lot of help with bathing/dressing/bathroom;Help with stairs or ramp for entrance;Assist for transportation ?  ?Equipment Recommendations ? Rolling walker (2 wheels)  ?  ?Recommendations for Other Services   ? ? ?  ?Precautions / Restrictions Precautions ?Precautions: Fall ?Precaution Comments: R chest perm-cath ?Restrictions ?Weight Bearing Restrictions: No  ?  ? ?Mobility ? Bed Mobility ?Overal bed mobility: Needs Assistance ?Bed Mobility: Supine to Sit ?  ?  ?Supine to sit: Min guard ?Sit to supine: Min assist ?  ?General bed mobility comments: minA for LE transfer back into bed ?  ? ?Transfers ?Overall transfer level: Needs assistance ?Equipment used: Rolling walker (2 wheels) ?Transfers: Sit to/from Stand ?Sit to Stand: Min assist ?  ?  ?  ?  ?  ?General transfer comment: increased verbal cues to push through LE; slow due to c/o chronic back pain. ?  ? ?Ambulation/Gait ?Ambulation/Gait assistance: Min guard ?Gait Distance (Feet): 8 Feet (was able to side step x2 along bed and move towards window after requesting "i want to look out the window, i love the sunshine".) ?Assistive device: Rolling walker (2 wheels) ?Gait Pattern/deviations: Step-to pattern ?  ?  ?  ?  ? ? ?Stairs ?  ?  ?  ?  ?  ? ? ?  Wheelchair Mobility ?  ? ?Modified Rankin (Stroke Patients Only) ?  ? ? ?  ?Balance Overall balance assessment: Needs assistance ?Sitting-balance support: Bilateral upper extremity supported, Feet supported ?Sitting balance-Leahy Scale: Good ?  ?  ?Standing balance support: Bilateral upper extremity supported ?Standing balance-Leahy Scale: Fair ?  ?  ?  ?  ?  ?  ?  ?  ?  ?  ?  ?  ?  ? ?  ?Cognition  Arousal/Alertness: Awake/alert ?Behavior During Therapy: [facility] for tasks assessed/performed ?Overall Cognitive Status: No family/caregiver present to determine baseline cognitive functioning ?  ?  ?  ?  ?  ?  ?  ?  ?  ?  ?  ?  ?  ?  ?  ?  ?General Comments: tolerated well with no issues today ?  ?  ? ?  ?Exercises Total Joint Exercises ?Long Arc Quad: AROM, Both, 10 reps ?Knee Flexion: AROM, Both, 10 reps ? ?  ?General Comments   ?  ?  ? ?Pertinent Vitals/Pain Pain Assessment ?Pain Assessment: No/denies pain  ? ? ?Home Living   ?  ?  ?  ?  ?  ?  ?  ?  ?  ?   ?  ?Prior Function    ?  ?  ?   ? ?PT Goals (current goals can now be found in the care plan section) Progress towards PT goals: Progressing toward goals ? ?  ?Frequency ? ? ? Min 2X/week ? ? ? ?  ?PT Plan Current plan remains appropriate  ? ? ?Co-evaluation   ?  ?  ?  ?  ? ?  ?AM-PAC PT "6 Clicks" Mobility   ?Outcome Measure ? Help needed turning from your back to your side while in a flat bed without using bedrails?: A Little ?Help needed moving from lying on your back to sitting on the side of a flat bed without using bedrails?: A Little ?Help needed moving to and from a bed to a chair (including a wheelchair)?: A Little ?Help needed standing up from a chair using your arms (e.g., wheelchair or bedside chair)?: A Little ?Help needed to walk in hospital room?: A Lot ?Help needed climbing 3-5 steps with a railing? : Total ?6 Click Score: 15 ? ?  ?End of Session Equipment Utilized During Treatment: Gait belt ?Activity Tolerance: Patient tolerated treatment well ?Patient left: in bed;with call bell/phone within reach;with bed alarm set ?Nurse Communication: Mobility status ?PT Visit Diagnosis: Muscle weakness (generalized) (M62.81);Difficulty in walking, not elsewhere classified (R26.2);Pain;Unsteadiness on feet (R26.81) ?  ? ? ?Time: 7902-4097 ?PT Time Calculation (min) (ACUTE ONLY): 24 min ? ?Charges:             ?          ? ? ?Jonnie Kind, SPT ?12/03/2021,  11:51 AM ? ?

## 2021-12-03 NOTE — Consult Note (Signed)
? ? ? ?PULMONOLOGY ? ? ? ? ? ? ? ? ?Date: 12/03/2021,   ?MRN# [id number] [name] 1961-09-19 ? ? ?  ?AdmissionWeight: 74.5 kg                 ?CurrentWeight: 77.3 kg ? ? ?Referring physician: Dr Lavella Lemons ? ? ?CHIEF COMPLAINT:  ? ?Pneumonia post COVID19 infection ? ? ?HISTORY OF PRESENT ILLNESS  ? ?61 yo F with hx of ESRD on HD, DM, HFpEF, CAD, PVD, CRF on O2 at 2l/min [place] presenting with sings of flu like illness with cough chills weakness mayaglia.  Patient has been with COVID19+ over past 1wk she had NVD 3d prior to admission.  I independently reivewed her CXR which appears to have both airspace and interstitial opacification with cardiomegaly. PCCM consultation for further evaluation and management.  ? ? ?PAST MEDICAL HISTORY  ? ?Past Medical History:  ?Diagnosis Date  ? Anemia   ? Anginal pain ([place])   ? Anxiety   ? Arthritis   ? Asthma   ? Broken wrist   ? Bronchitis   ? chronic diastolic CHF 2/35/3614  ? Chronic kidney disease   ? COPD (chronic obstructive pulmonary disease) ([place])   ? Coronary artery disease   ? a. cath 2013: stenting to RCA (report not available); b. cath 2014: LM nl, pLAD 40%, mLAD nl, ost LCx 40%, mid LCx nl, pRCA 30% @ site of prior stent, mRCA 50%  ? Depression   ? Diabetes mellitus ([place])   ? Diabetes mellitus without complication ([place])   ? Diabetic neuropathy ([place])   ? dialysis 2006  ? Diverticulosis   ? Dizziness   ? Dyspnea   ? Elevated lipids   ? Environmental and seasonal allergies   ? ESRD (end stage renal disease) on dialysis [facility])   ? M-W-F  ? Gastroparesis   ? GERD (gastroesophageal reflux disease)   ? Headache   ? History of anemia due to chronic kidney disease   ? History of hiatal hernia   ? HOH (hard of hearing)   ? Hx of pancreatitis 2015  ? Hypertension   ? Lower extremity edema   ? Mitral regurgitation   ? a. echo 10/2013: EF 62%, noWMA, mildly dilated LA, mild to mod MR/TR, GR1DD  ? Myocardial infarction [facility])   ? Orthopnea   ? Parathyroid abnormality ([place])   ? Peripheral  arterial disease ([place])   ? Pneumonia   ? Renal cancer ([place])   ? Renal insufficiency   ? Pt is on dialysis on M,W + F.  ? Wheezing   ? ? ? ?SURGICAL HISTORY  ? ?Past Surgical History:  ?Procedure Laterality Date  ? A/V SHUNTOGRAM Left 01/20/2018  ? Procedure: A/V SHUNTOGRAM;  Surgeon: Algernon Huxley, MD;  Location: [place] [facility];  Service: Cardiovascular;  Laterality: Left;  ? ABDOMINAL HYSTERECTOMY  1992  ? AMPUTATION TOE Left 10/02/2017  ? Procedure: AMPUTATION TOE-LEFT GREAT TOE;  Surgeon: Albertine Patricia, DPM;  Location: [facility] OR[facility];  Service: Podiatry;  Laterality: Left;  ? APPENDECTOMY    ? APPLICATION OF WOUND VAC N/A 11/25/2019  ? Procedure: APPLICATION OF WOUND VAC;  Surgeon: Katha Cabal, MD;  Location: [facility];  Service: Vascular;  Laterality: N/A;  ? ARTERY BIOPSY Right 10/11/2018  ? Procedure: BIOPSY TEMPORAL ARTERY;  Surgeon: Vickie Epley, MD;  Location: [facility];  Service: General;  Laterality: Right;  ? CARDIAC CATHETERIZATION Left 07/26/2015  ? Procedure: Left Heart Cath  and Coronary Angiography;  Surgeon: Dionisio David, MD;  Location: [place] [facility];  Service: Cardiovascular;  Laterality: Left;  ? CATARACT EXTRACTION W/ INTRAOCULAR LENS IMPLANT Right   ? CATARACT EXTRACTION W/PHACO Left 03/10/2017  ? Procedure: CATARACT EXTRACTION PHACO AND INTRAOCULAR LENS PLACEMENT (IOC);  Surgeon: Birder Robson, MD;  Location: [facility];  Service: Ophthalmology;  Laterality: Left;  [place] 00:51.9 ?AP% 14.2 ?CDE 7.39 ?fluid pack lot # [id number]  ? CENTRAL LINE INSERTION Right 11/11/2019  ? Procedure: CENTRAL LINE INSERTION;  Surgeon: Katha Cabal, MD;  Location: [facility];  Service: Vascular;  Laterality: Right;  ? CENTRAL LINE INSERTION  11/25/2019  ? Procedure: CENTRAL LINE INSERTION;  Surgeon: Delana Meyer Dolores Lory, MD;  Location: [facility];  Service: Vascular;;  ? CHOLECYSTECTOMY    ? COLONOSCOPY WITH PROPOFOL N/A 08/12/2016  ? Procedure: COLONOSCOPY WITH PROPOFOL;  Surgeon: Lollie Sails, MD;   Location[facility] ENDOSCOPY;  Service: Endoscopy;  Laterality: N/A;  ? DIALYSIS FISTULA CREATION Left   ? upper arm  ? dialysis grafts    ? DIALYSIS/PERMA CATHETER INSERTION N/A 11/14/2019  ? Procedure: DIALYSIS/PERMA CATHETER INSERTION;  Surgeon: Algernon Huxley, MD;  Location: [place] [facility];  Service: Cardiovascular;  Laterality: N/A;  ? DIALYSIS/PERMA CATHETER INSERTION N/A 02/03/2020  ? Procedure: DIALYSIS/PERMA CATHETER INSERTION;  Surgeon: Katha Cabal, MD;  Location: [place] [facility];  Service: Cardiovascular;  Laterality: N/A;  ? DIALYSIS/PERMA CATHETER INSERTION N/A 06/19/2020  ? Procedure: DIALYSIS/PERMA CATHETER INSERTION;  Surgeon: Katha Cabal, MD;  Location: [place] [facility];  Service: Cardiovascular;  Laterality: N/A;  ? DIALYSIS/PERMA CATHETER INSERTION N/A 05/07/2021  ? Procedure: DIALYSIS/PERMA CATHETER INSERTION;  Surgeon: Katha Cabal, MD;  Location: [place] [facility];  Service: Cardiovascular;  Laterality: N/A;  Patient needs general anethesia  ? DIALYSIS/PERMA CATHETER REMOVAL N/A 05/25/2020  ? Procedure: DIALYSIS/PERMA CATHETER REMOVAL;  Surgeon: Katha Cabal, MD;  Location: [place] [facility];  Service: Cardiovascular;  Laterality: N/A;  ? ESOPHAGOGASTRODUODENOSCOPY N/A 03/08/2015  ? Procedure: ESOPHAGOGASTRODUODENOSCOPY (EGD);  Surgeon: Manya Silvas, MD;  Location: [facility];  Service: Endoscopy;  Laterality: N/A;  ? ESOPHAGOGASTRODUODENOSCOPY (EGD) WITH PROPOFOL N/A 03/18/2016  ? Procedure: ESOPHAGOGASTRODUODENOSCOPY (EGD) WITH PROPOFOL;  Surgeon: [name], MD;  Location: [facility] ENDOSCOPY;  Service: Endoscopy;  Laterality: N/A;  ? EYE SURGERY Right 2018  ? FECAL TRANSPLANT N/A 08/23/2015  ? Procedure: FECAL TRANSPLANT;  Surgeon: Manya Silvas, MD;  Location: [facility] ENDOSCOPY;  Service: Endoscopy;  Laterality: N/A;  ? HAND SURGERY Bilateral   ? HEMATOMA EVACUATION Left 11/25/2019  ? Procedure: EVACUATION HEMATOMA;  Surgeon: Katha Cabal, MD;   Location: [facility];  Service: Vascular;  Laterality: Left;  ? I & D EXTREMITY Left 11/25/2019  ? Procedure: IRRIGATION AND DEBRIDEMENT EXTREMITY;  Surgeon: Katha Cabal, MD;  Location: [facility];  Service: Vascular;  Laterality: Left;  ? IR FLUORO GUIDE CV LINE RIGHT  04/06/2020  ? IR INJECT/THERA/INC NEEDLE/CATH/PLC EPI/CERV/THOR W/IMG  08/13/2020  ? IR RADIOLOGIST EVAL & MGMT  07/28/2019  ? IR RADIOLOGIST EVAL & MGMT  08/11/2019  ? LIGATION OF ARTERIOVENOUS  FISTULA Left 11/11/2019  ? LIGATION OF ARTERIOVENOUS  FISTULA Left 11/11/2019  ? Procedure: LIGATION OF ARTERIOVENOUS  FISTULA;  Surgeon: Katha Cabal, MD;  Location: [facility];  Service: Vascular;  Laterality: Left;  ? LIGATIONS OF HERO GRAFT Right 06/13/2020  ? Procedure: LIGATION / REMOVAL OF RIGHT HERO GRAFT;  Surgeon: Katha Cabal, MD;  Location: [facility];  Service: Vascular;  Laterality: Right;  ? PERIPHERAL VASCULAR CATHETERIZATION N/A 12/20/2015  ? Procedure: Thrombectomy of dialysis access versus permcath placement;  Surgeon: Algernon Huxley, MD;  Location: [place] [facility];  Service: Cardiovascular;  Laterality: N/A;  ? PERIPHERAL VASCULAR CATHETERIZATION N/A 12/20/2015  ? Procedure: A/V Shunt Intervention;  Surgeon: Algernon Huxley, MD;  Location: [place] [facility];  Service: Cardiovascular;  Laterality: N/A;  ? PERIPHERAL VASCULAR CATHETERIZATION N/A 12/20/2015  ? Procedure: A/V Shuntogram/Fistulagram;  Surgeon: Algernon Huxley, MD;  Location: [place] [facility];  Service: Cardiovascular;  Laterality: N/A;  ? PERIPHERAL VASCULAR CATHETERIZATION N/A 01/02/2016  ? Procedure: A/V Shuntogram/Fistulagram;  Surgeon: Algernon Huxley, MD;  Location: [place] [facility];  Service: Cardiovascular;  Laterality: N/A;  ? PERIPHERAL VASCULAR CATHETERIZATION N/A 01/02/2016  ? Procedure: A/V Shunt Intervention;  Surgeon: Algernon Huxley, MD;  Location: [place] [facility];  Service: Cardiovascular;  Laterality: N/A;  ? TEE WITHOUT CARDIOVERSION N/A 06/11/2020  ?  Procedure: TRANSESOPHAGEAL ECHOCARDIOGRAM (TEE);  Surgeon: Teodoro Spray, MD;  Location: [facility];  Service: Cardiovascular;  Laterality: N/A;  ? UPPER EXTREMITY ANGIOGRAPHY Bilateral 11/27/2020  ? Procedure: UPPER

## 2021-12-03 NOTE — Consult Note (Signed)
? ? ? ?Date of Admission:  11/27/2021    ?      ?Reason for Consult:  + culture for ESBL from sputum and worsening infiltrates on CXR   ?Referring Provider: Vernell Leep, MD ? ? ?Assessment: ? ?COVID 19 infection ?Possible superimposed bacterial pneumonia vs lingering effects of COVID 19 (could also have been rebound COVID) ?Bilateral infiltrates with effusions likely due to pulmonary edema --though patient is reportedly not especially volume overloaded ?ESRD on HD ?ESBL culture from sputum = colonizer ?Heard of hearing (she believes due to [place] in part)  ?CAD ?CHF ?PVD ?Hyponatremia ? ?Plan: ? ?Would continue to observe off of antibiotics ?Continue HD, supportive care ?She may take time to have her cough resolve ?She should have audiology evaluation as an outpatient ?Will screen for HCV ?Contact preautions ? ? ?I will sign off for now ? ?Please call with further questions. ? ?Principal Problem: ?  Bacterial pneumonia ?Active Problems: ?  End-stage renal disease on hemodialysis ([place]) ?  Diabetes mellitus, type II ([place]) ?  HTN (hypertension) ?  Coronary artery disease involving native coronary artery of native heart without angina pectoris ?  Physical debility ?  Unresponsiveness ?  Chronic diastolic CHF (congestive heart failure) ([place]) ?  Chronic respiratory failure with hypoxia (H[facility]) ?  Hyponatremia ?  COVID-19 virus infection ? ? ?Scheduled Meds: ? albuterol  2 puff Inhalation Q6H  ? amLODipine  10 mg Oral Daily  ? aspirin EC  81 mg Oral Daily  ? atorvastatin  20 mg Oral QPM  ? benzonatate  200 mg Oral TID  ? Chlorhexidine Gluconate Cloth  6 each Topical Q0600  ? cholecalciferol  1,000 Units Oral Daily  ? feeding supplement  1 Container Oral TID BM  ? feeding supplement (NEPRO CARB STEADY)  237 mL Oral BID BM  ? gabapentin  100 mg Oral QHS  ? heparin  5,000 Units Subcutaneous Q8H  ? mirtazapine  7.5 mg Oral QHS  ? multivitamin  1 tablet Oral QHS  ? pantoprazole  40 mg Oral BID  ? sevelamer carbonate  800 mg Oral  TID WC & HS  ? cyanocobalamin  1,000 mcg Oral Daily  ? ?Continuous Infusions: ?PRN Meds:.acetaminophen, calcium carbonate, fluticasone, guaiFENesin-dextromethorphan, menthol-cetylpyridinium, naLOXone (NARCAN)  injection, nitroGLYCERIN, polyethylene glycol, promethazine ? ?HPI: [name] is a 61 y.o. female with multiple medical problems including end-stage renal disease on hemodialysis chronic diastolic heart failure coronary artery disease peripheral vascular disease chronic respiratory failure on home oxygen with 2 L of oxygen via nasal cannula who presented to the ER on 8 March with a 7 to 10-day history of cough chills weakness and myalgia.  She had tested positive for COVID in this time. ? ?Chest x-ray in the ER showed some congestion and edema and a pleural effusion and consideration of superimposed pneumonia was made.  Her COVID-19 test was still positive with a cycle threshold in the 20s consistent with active disease.  It was decided not to treat COVID-19 but for bacterial superinfection with ceftriaxone and azithromycin which she received for 5 days. ? ?She has been afebrile. ? ?Cultures have been sent and have yielded an extended spectrum beta-lactamase producing multidrug drug-resistant E. coli.  This was not targeted as a pathogen since she was improving on ceftriaxone and azithromycin.  Repeat chest x-ray shows increasing interstitial infiltrates and bilateral pleural effusions. ? ?On exam she has a cough but cream seems quite stable and has no fevers. ? ?I am very  skeptical that her current radiographic findings are due to to her ESBL causing and pneumonia. ? ?I certainly think her entire course could be explained by COVID-19 infection that is taking longer to clear or potentially in the context of acute COVID-19 infection with rebound which can occur without antivirals have being introduced.  Her need for hemodialysis and fluctuating volume status in the context of end-stage renal disease and heart  failure certainly makes interpretation of her checked chest x-rays far some simple. ? ?I do not think that she has an active bacterial pneumonia and I would not prescribe her further antibacterial antibiotics.  I would continue her supportive care continue dialysis and continue to monitor for any further evidence of infection. ? ?Her cough may persist for some time and it is fortunate that she did not become more critically ill given her chronic hypoxic respiratory failure. ? ?She is quite hard of hearing and I think needs a formal evaluation by audiology as an outpatient particularly since she [place] feels that she is more hard of hearing since she contracted COVID-19 ? ?I spent 82 minutes with the patient including than 50% of the time in face to face counseling of the patient regarding her COVID-19 infection possible bacterial pneumonia end-stage renal disease on hemodialysis CHF personally reviewing her chest x-rays done throughout this admission updated culture data along with review of medical records in preparation for the visit and during the visit and in coordination of her care. ? ? ? ?Review of Systems: ?Review of Systems  ?Constitutional:  Negative for chills, diaphoresis, fever, malaise/fatigue and weight loss.  ?HENT:  Positive for hearing loss. Negative for congestion, sore throat and tinnitus.   ?Eyes:  Negative for blurred vision and double vision.  ?Respiratory:  Positive for cough and sputum production. Negative for shortness of breath and wheezing.   ?Cardiovascular:  Positive for palpitations. Negative for chest pain and leg swelling.  ?Gastrointestinal:  Positive for nausea. Negative for abdominal pain, blood in stool, constipation, diarrhea, heartburn, melena and vomiting.  ?Genitourinary:  Negative for flank pain and hematuria.  ?Musculoskeletal:  Positive for myalgias. Negative for back pain, falls and joint pain.  ?Skin:  Negative for itching and rash.  ?Neurological:  Negative for dizziness,  sensory change, focal weakness, loss of consciousness, weakness and headaches.  ?Endo/Heme/Allergies:  Does not bruise/bleed easily.  ?Psychiatric/Behavioral:  Negative for depression, memory loss and suicidal ideas. The patient is not nervous/anxious.   ? ?Past Medical History:  ?Diagnosis Date  ? Anemia   ? Anginal pain ([place])   ? Anxiety   ? Arthritis   ? Asthma   ? Broken wrist   ? Bronchitis   ? chronic diastolic CHF 9/32/3557  ? Chronic kidney disease   ? COPD (chronic obstructive pulmonary disease) ([place])   ? Coronary artery disease   ? a. cath 2013: stenting to RCA (report not available); b. cath 2014: LM nl, pLAD 40%, mLAD nl, ost LCx 40%, mid LCx nl, pRCA 30% @ site of prior stent, mRCA 50%  ? Depression   ? Diabetes mellitus ([place])   ? Diabetes mellitus without complication ([place])   ? Diabetic neuropathy ([place])   ? dialysis 2006  ? Diverticulosis   ? Dizziness   ? Dyspnea   ? Elevated lipids   ? Environmental and seasonal allergies   ? ESRD (end stage renal disease) on dialysis [facility])   ? M-W-F  ? Gastroparesis   ? GERD (gastroesophageal reflux disease)   ?  Headache   ? History of anemia due to chronic kidney disease   ? History of hiatal hernia   ? HOH (hard of hearing)   ? Hx of pancreatitis 2015  ? Hypertension   ? Lower extremity edema   ? Mitral regurgitation   ? a. echo 10/2013: EF 62%, noWMA, mildly dilated LA, mild to mod MR/TR, GR1DD  ? Myocardial infarction [facility])   ? Orthopnea   ? Parathyroid abnormality ([place])   ? Peripheral arterial disease ([place])   ? Pneumonia   ? Renal cancer ([place])   ? Renal insufficiency   ? Pt is on dialysis on M,W + F.  ? Wheezing   ? ? ?Social History  ? ?Tobacco Use  ? Smoking status: Never  ? Smokeless tobacco: Never  ?Vaping Use  ? Vaping Use: Never used  ?Substance Use Topics  ? Alcohol use: Not Currently  ?  Comment: glass wine week per pt  ? Drug use: Yes  ?  Types: Marijuana  ?  Comment: once a day  ? ? ?Family History  ?Problem Relation Age of Onset  ? Kidney disease  Mother   ? Diabetes Mother   ? Cancer Father   ? Kidney disease Sister   ? ?Allergies  ?Allergen Reactions  ? Ace Inhibitors Swelling and Anaphylaxis  ? Ativan [Lorazepam] Other (See Comments)  ?  Reac

## 2021-12-03 NOTE — TOC Progression Note (Addendum)
Transition of Care (TOC) - Progression Note  ? ? ?Patient Details  ?Name: [name] ?MRN: [id number] ?Date of Birth: 08-Sep-1961 ? ?Transition of Care (TOC) CM/SW Contact  ?Candie Chroman, LCSW ?Phone Number: ?12/03/2021, 10:03 AM ? ?Clinical Narrative:   Obtained 3/1 COVID results from HD coordinator. Asked [facility] admissions coordinator to confirm they can use these before bed offers are given. ? ?1:59 pm: [facility] is able to accept 3/1 COVID results. Tried calling patient in room to discuss bed offers but no answer. Asked RN to make sure phone is within reach next time she goes into the room. ? ?3:18 pm: Tried calling patient in the room again. No answer. Called son and reviewed options. He will discuss with patient and will follow up tomorrow with decision. Did tell him that if patient is determined to be stable, will need to move forward with [facility]. ? ?  ?Barriers to Discharge: SNF Pending bed offer ? ?Expected Discharge Plan and Services ?  ?  ?  ?  ?  ?                ?  ?  ?  ?  ?  ?  ?  ?  ?  ?  ? ? ?Social Determinants of Health (SDOH) Interventions ?  ? ?Readmission Risk Interventions ?Readmission Risk Prevention Plan 05/03/2021 12/11/2020 09/21/2020  ?Transportation Screening Complete Complete Complete  ?Medication Review Press photographer) Complete Complete Complete  ?PCP or Specialist appointment within 3-5 days of discharge - Complete Complete  ?[place] or Home Care Consult - Complete Complete  ?SW Recovery Care/Counseling Consult - Complete Complete  ?Palliative Care Screening Complete Not Applicable Complete  ?Skilled Nursing Facility - Not Complete Complete  ?SNF Comments - working on placement -  ?Some recent data might be hidden  ? ? ?

## 2021-12-03 NOTE — Progress Notes (Signed)
?PROGRESS NOTE ?  ?[name]  JIR:[id number]    DOB: 1960-09-27    DOA: 11/27/2021 ? ?PCP: Denton Lank, MD  ? ?I have briefly reviewed patients previous medical records in [facility]. ? ?Chief Complaint  ?Patient presents with  ? Generalized Body Aches  ? ? ?Hospital Course:  ?61 year old female with medical history significant for ESRD on TTS HD, type II DM, chronic diastolic CHF, CAD, PAD, chronic respiratory failure on home oxygen 2 L/min, COPD, anemia, HLD, GERD, hard of hearing, presented to the ED with 7 to 10 days of cough, chills, weakness and myalgia.  Reported testing positive for COVID 19 at the dialysis center more than a week ago.  Denies dyspnea or chest pain.  Developed self-limiting diarrhea 3 days PTA without nausea, vomiting or abdominal pain.  Admitted for bacterial pneumonia superimposed on recent COVID-19 infection.  Nephrology consulted for HD needs.  On 3/9, patient obtunded, rapid response initiated, improved after a dose of Narcan, opioids discontinued.  Completed 5 days course of IV antibiotics for pneumonia.  Although clinically improved, persistent bothersome cough, chest x-ray shows worsening infiltrates, ID consulted (no interventions recommended, pulmonology consulted and getting CT chest to further evaluate. ? ? ?Assessment & Plan:  ?Principal Problem: ?  Bacterial pneumonia ?Active Problems: ?  Unresponsiveness ?  COVID-19 virus infection ?  Chronic respiratory failure with hypoxia (HCC) ?  End-stage renal disease on hemodialysis ([place]) ?  Hyponatremia ?  HTN (hypertension) ?  Diabetes mellitus, type II ([place]) ?  Chronic diastolic CHF (congestive heart failure) ([place]) ?  Coronary artery disease involving native coronary artery of native heart without angina pectoris ?  Physical debility ? ? ?Assessment and Plan: ?* Bacterial pneumonia ?Based on symptomatology including productive cough and chest x-ray findings. ?Procalcitonin elevated but unclear significance in the context of ESRD  patient. ?Completed total 5 days of IV antibiotics including ceftriaxone and azithromycin ?Blood cultures negative to date.  Sputum culture showed E. coli, MDR except to gentamicin, imipenem and Bactrim ?As per communication with pharmacy, patient has clinically improved despite current antibiotics not covering for E. coli and hence completed ceftriaxone.  Option for treating E. coli would be Bactrim, not appropriate in dialysis patient ?Ongoing supportive care and aggressive pulmonary toilet.  Strict aspiration precautions. ?Ongoing bothersome cough, interrupting night sleep.  Thereby consulted ID and pulmonology. ?ID input appreciated, recommend observing off of antibiotics and indicated that it may take time for cough to resolve.  Outpatient audiology evaluation. ?Pulmonology input appreciated.  Discussed with Dr. Lanney Gins, pulmonology.  He is getting CT chest without contrast to rule out ILD versus fluid versus fibrosis and recommends gabapentin 100 Mg nightly for refractory cough. ? ? ?Unresponsiveness ?Acute toxic metabolic encephalopathy ?On 3/9, RN alerted that patient was barely arousable, respiratory rate of 10. ?Ordered Narcan, ABG and advised to activate rapid response team. ?Improved after dose of Narcan. ?Likely related to Vicodin, Tussionex complicating dialysis patient. ?Discontinued all opioids and minimize use of same. ?Mental status has significantly improved over the last couple of days.  Patient has not reported much pain. ?Encephalopathy resolved. ? ?COVID-19 virus infection ?Per patient report, tested positive for COVID-19 at the outpatient dialysis center more than a week ago. ?Hypoxia no worse than her baseline and requiring 2 L/min [place] oxygen. ?Discussed in detail with pharmacy, given duration of her COVID infection, no antivirals initiated.  No indication for steroids in the absence of worsening hypoxia. ?Supportive treatment. ?ID input appreciated.  No further antimicrobials  advised.   Suggest that she may have lingering effects of COVID-19.  ? ?Chronic respiratory failure with hypoxia (HCC) ?2 L/min O2 requirement at baseline. ?Appears to be at baseline.  Unclear why she is on 4 L well saturating in the high 90s.  Needs to be titrated down to her home level. ?ABG this afternoon during unresponsive episode, pH 7.35, PCO2 52, PO2 108 and oxygen saturation 98.9 ? ?End-stage renal disease on hemodialysis ([place]) ?Nephrology consulted for HD needs. ?Going for HD today. ? ?Hyponatremia ?Sodium 129 but at baseline ?Her baseline and likely related to ESRD and HD. ?Management per nephrology. ? ?HTN (hypertension) ?Continue amlodipine, hydralazine ?Reasonably controlled. ? ?Diabetes mellitus, type II ([place]) ?Had some hypoglycemic episodes and CBGs monitored closely, no further episodes. ?Not on insulins.   ?A1c well controlled at 6.2 on 11/15/2021 ?DC CBGs ? ?Chronic diastolic CHF (congestive heart failure) ([place]) ?Compensated.  Continue furosemide. ?Volume management across HD. ? ?Physical debility ?Patient is profoundly weak. ?As per son's report, not even able to feed herself.  However on my exam, has at least 4+ by 5 power in bilateral upper extremities and so unsure why she cannot feed herself.  Requested RN and NP to encourage patient to feed herself and supervised same. ?PT input appreciated and recommend SNF.  Son agreeable. ?TOC consulted for SNF placement ? ?Coronary artery disease involving native coronary artery of native heart without angina pectoris ?Continue aspirin and atorvastatin with nitroglycerin as needed chest pain ? ? ?Body mass index is 31.17 kg/m?. ? ? ? ? ?DVT prophylaxis: heparin injection 5,000 Units Start: 11/27/21 0600   ?  Code Status: Full Code:  ?Family Communication: Son via phone, discussed in detail and updated care and answered all questions.  Communicated with him that her cough recovery may be prolonged.  Dated him regarding the ID and pulmonology  consultations ?Disposition:  ?Status is: inpatient. ?  ? ?Consultants:   ?Nephrology ? ?Procedures:   ?HD ? ?Antimicrobials:   ?As above ? ? ?Subjective:  ?Reports ongoing bothersome cough with intermittent yellow sputum, unable to sleep last night due to cough.  No chest pain or dyspnea reported. ? ?Objective:  ? ?Vitals:  ? 12/02/21 1845 12/02/21 1915 12/03/21 0450 12/03/21 4132  ?BP: (!) 172/67 (!) 153/62 (!) 163/60 (!) 166/71  ?Pulse: 70 73 71 71  ?Resp: '17 20 20 16  '$ ?Temp:  98.6 ?F (37 ?C) 99 ?F (37.2 ?C) 98.5 ?F (36.9 ?C)  ?TempSrc:  Oral Oral Oral  ?SpO2:  96% 100% 100%  ?Weight:      ?Height:      ? ? ?General exam: Middle-age female, moderately built and nourished sitting up comfortably in bed in eating meal by herself. ?Respiratory system: Occasional basal crackles but otherwise clear to auscultation.  No increased work of breathing. ?Cardiovascular system: S1 and S2 heard, RRR.  No JVD, murmurs or pedal edema.  No change/stable. ?Gastrointestinal system: Abdomen is nondistended, soft and nontender. No organomegaly or masses felt. Normal bowel sounds heard. ?Central nervous system: Alert and oriented.  Extremely hard of hearing makes it difficult to communicate.  Can only hear out of her right ear.  No focal neurological deficits. ?Extremities: Symmetric 5 x 5 power.  Bilateral legs with chronic changes including lichenification of skin.  No acute findings. ?Skin: No rashes, lesions or ulcers ?Psychiatry: Judgement and insight impaired but improved.  Mood & affect difficult to assess. ? ? ? ?Data Reviewed:   ?I have personally reviewed following labs  and imaging studies ? ? ?CBC: ?Recent Labs  ?Lab 11/27/21 ?0354 11/29/21 ?1405  ?WBC 7.5 4.4  ?NEUTROABS 4.3  --   ?HGB 12.1 12.3  ?HCT 39.5 39.1  ?MCV 88.6 88.3  ?PLT 319 282  ? ? ?Basic Metabolic Panel: ?Recent Labs  ?Lab 11/27/21 ?0354 11/29/21 ?1405 12/03/21 ?8850  ?NA 129* 131* 130*  ?K 4.7 4.2 3.5  ?CL 89* 93* 92*  ?CO2 '25 27 29  '$ ?GLUCOSE 99 78 161*   ?BUN 33* 24* 12  ?CREATININE 6.37* 6.10* 4.32*  ?CALCIUM 7.9* 8.0* 8.2*  ?MG 1.9  --   --   ?PHOS  --  6.6* 4.9*  ? ? ?Liver Function Tests: ?Recent Labs  ?Lab 11/27/21 ?0354 11/29/21 ?1405 12/03/21 ?2774  ?AST 12*  --   --   ?ALT

## 2021-12-03 NOTE — Progress Notes (Addendum)
Hemodialysis patient known at [place] MWF 10:00am, rides ACTA.  ?

## 2021-12-04 ENCOUNTER — Inpatient Hospital Stay (HOSPITAL_COMMUNITY)
Admit: 2021-12-04 | Discharge: 2021-12-04 | Disposition: A | Discharge status: Home / Self Care | Payer: Medicare Other | Attending: Pulmonary Disease | Admitting: Pulmonary Disease

## 2021-12-04 ENCOUNTER — Inpatient Hospital Stay
Admit: 2021-12-04 | Discharge: 2021-12-04 | Disposition: A | Discharge status: Home / Self Care | Payer: Medicare Other | Attending: Pulmonary Disease | Admitting: Pulmonary Disease

## 2021-12-04 DIAGNOSIS — IMO0001 Reserved for inherently not codable concepts without codable children: Secondary | ICD-10-CM

## 2021-12-04 DIAGNOSIS — I5021 Acute systolic (congestive) heart failure: Secondary | ICD-10-CM

## 2021-12-04 DIAGNOSIS — I10 Essential (primary) hypertension: Secondary | ICD-10-CM

## 2021-12-04 DIAGNOSIS — H9190 Unspecified hearing loss, unspecified ear: Secondary | ICD-10-CM

## 2021-12-04 LAB — BRAIN NATRIURETIC PEPTIDE: B Natriuretic Peptide: 498.8 pg/mL — ABNORMAL HIGH (ref 0.0–100.0)

## 2021-12-04 LAB — ECHOCARDIOGRAM COMPLETE
Height: 62 in
S' Lateral: 3 cm
Weight: 2726.65 oz

## 2021-12-04 MED ORDER — ACETAMINOPHEN 325 MG PO TABS
ORAL_TABLET | ORAL | Status: AC
Start: 2021-12-04 — End: 2021-12-04
  Filled 2021-12-04: qty 2

## 2021-12-04 NOTE — Progress Notes (Signed)
*  PRELIMINARY RESULTS* ?Echocardiogram ?2D Echocardiogram has been performed. ? ?Angus Amini, Sonia Side ?12/04/2021, 2:39 PM ?

## 2021-12-04 NOTE — Progress Notes (Signed)
? ? ? ?PULMONOLOGY ? ? ? ? ? ? ? ? ?Date: 12/04/2021,   ?MRN# [id number] [name] October 01, 1960 ? ? ?  ?AdmissionWeight: 74.5 kg                 ?CurrentWeight: 77.3 kg ? ? ?Referring physician: Dr Lavella Lemons ? ? ?CHIEF COMPLAINT:  ? ?Pneumonia post COVID19 infection ? ? ?HISTORY OF PRESENT ILLNESS  ? ?61 yo F with hx of ESRD on HD, DM, HFpEF, CAD, PVD, CRF on O2 at 2l/min [place] presenting with sings of flu like illness with cough chills weakness mayaglia.  Patient has been with COVID19+ over past 1wk she had NVD 3d prior to admission.  I independently reivewed her CXR which appears to have both airspace and interstitial opacification with cardiomegaly. PCCM consultation for further evaluation and management.  ? ?12/04/21- Reviewed CT chest independently, with bilateral pleural effusions, signs of interstitial lung disease with traction bronchiectasis and cardiomegaly with pulmonary edema and associated subcutaneous edema and lymphadenopathy.  CRP elevated. Plan to continue steroids, HD for fluid balanace. ?TTE and BNP in process ? ?PAST MEDICAL HISTORY  ? ?Past Medical History:  ?Diagnosis Date  ? Anemia   ? Anginal pain ([place])   ? Anxiety   ? Arthritis   ? Asthma   ? Broken wrist   ? Bronchitis   ? chronic diastolic CHF 1/32/4401  ? Chronic kidney disease   ? COPD (chronic obstructive pulmonary disease) ([place])   ? Coronary artery disease   ? a. cath 2013: stenting to RCA (report not available); b. cath 2014: LM nl, pLAD 40%, mLAD nl, ost LCx 40%, mid LCx nl, pRCA 30% @ site of prior stent, mRCA 50%  ? Depression   ? Diabetes mellitus ([place])   ? Diabetes mellitus without complication ([place])   ? Diabetic neuropathy ([place])   ? dialysis 2006  ? Diverticulosis   ? Dizziness   ? Dyspnea   ? Elevated lipids   ? Environmental and seasonal allergies   ? ESRD (end stage renal disease) on dialysis [facility])   ? M-W-F  ? Gastroparesis   ? GERD (gastroesophageal reflux disease)   ? Headache   ? History of anemia due to chronic kidney disease    ? History of hiatal hernia   ? HOH (hard of hearing)   ? Hx of pancreatitis 2015  ? Hypertension   ? Lower extremity edema   ? Mitral regurgitation   ? a. echo 10/2013: EF 62%, noWMA, mildly dilated LA, mild to mod MR/TR, GR1DD  ? Myocardial infarction [facility])   ? Orthopnea   ? Parathyroid abnormality ([place])   ? Peripheral arterial disease ([place])   ? Pneumonia   ? Renal cancer ([place])   ? Renal insufficiency   ? Pt is on dialysis on M,W + F.  ? Wheezing   ? ? ? ?SURGICAL HISTORY  ? ?Past Surgical History:  ?Procedure Laterality Date  ? A/V SHUNTOGRAM Left 01/20/2018  ? Procedure: A/V SHUNTOGRAM;  Surgeon: Algernon Huxley, MD;  Location: [place] [facility];  Service: Cardiovascular;  Laterality: Left;  ? ABDOMINAL HYSTERECTOMY  1992  ? AMPUTATION TOE Left 10/02/2017  ? Procedure: AMPUTATION TOE-LEFT GREAT TOE;  Surgeon: Albertine Patricia, DPM;  Location: [facility] ORS;  Service: Podiatry;  Laterality: Left;  ? APPENDECTOMY    ? APPLICATION OF WOUND VAC N/A 11/25/2019  ? Procedure: APPLICATION OF WOUND VAC;  Surgeon: Katha Cabal, MD;  Location: [facility] OR[facility];  Service:  Vascular;  Laterality: N/A;  ? ARTERY BIOPSY Right 10/11/2018  ? Procedure: BIOPSY TEMPORAL ARTERY;  Surgeon: Vickie Epley, MD;  Location: [facility] OR[facility];  Service: General;  Laterality: Right;  ? CARDIAC CATHETERIZATION Left 07/26/2015  ? Procedure: Left Heart Cath and Coronary Angiography;  Surgeon: Dionisio David, MD;  Location: [place] [facility];  Service: Cardiovascular;  Laterality: Left;  ? CATARACT EXTRACTION W/ INTRAOCULAR LENS IMPLANT Right   ? CATARACT EXTRACTION W/PHACO Left 03/10/2017  ? Procedure: CATARACT EXTRACTION PHACO AND INTRAOCULAR LENS PLACEMENT (IOC);  Surgeon: Birder Robson, MD;  Location: [facility];  Service: Ophthalmology;  Laterality: Left;  [place] 00:51.9 ?AP% 14.2 ?CDE 7.39 ?fluid pack lot # [id number]  ? CENTRAL LINE INSERTION Right 11/11/2019  ? Procedure: CENTRAL LINE INSERTION;  Surgeon: Katha Cabal, MD;  Location: [facility];  Service:  Vascular;  Laterality: Right;  ? CENTRAL LINE INSERTION  11/25/2019  ? Procedure: CENTRAL LINE INSERTION;  Surgeon: Delana Meyer Dolores Lory, MD;  Location: [facility];  Service: Vascular;;  ? CHOLECYSTECTOMY    ? COLONOSCOPY WITH PROPOFOL N/A 08/12/2016  ? Procedure: COLONOSCOPY WITH PROPOFOL;  Surgeon: Lollie Sails, MD;  Location: [facility] ENDOSCOPY;  Service: Endoscopy;  Laterality: N/A;  ? DIALYSIS FISTULA CREATION Left   ? upper arm  ? dialysis grafts    ? DIALYSIS/PERMA CATHETER INSERTION N/A 11/14/2019  ? Procedure: DIALYSIS/PERMA CATHETER INSERTION;  Surgeon: Algernon Huxley, MD;  Location: [place] [facility];  Service: Cardiovascular;  Laterality: N/A;  ? DIALYSIS/PERMA CATHETER INSERTION N/A 02/03/2020  ? Procedure: DIALYSIS/PERMA CATHETER INSERTION;  Surgeon: Katha Cabal, MD;  Location: [place] [facility];  Service: Cardiovascular;  Laterality: N/A;  ? DIALYSIS/PERMA CATHETER INSERTION N/A 06/19/2020  ? Procedure: DIALYSIS/PERMA CATHETER INSERTION;  Surgeon: Katha Cabal, MD;  Location: [place] [facility] LAB;  Service: Cardiovascular;  Laterality: N/A;  ? DIALYSIS/PERMA CATHETER INSERTION N/A 05/07/2021  ? Procedure: DIALYSIS/PERMA CATHETER INSERTION;  Surgeon: Katha Cabal, MD;  Location: [place] [facility];  Service: Cardiovascular;  Laterality: N/A;  Patient needs general anethesia  ? DIALYSIS/PERMA CATHETER REMOVAL N/A 05/25/2020  ? Procedure: DIALYSIS/PERMA CATHETER REMOVAL;  Surgeon: Katha Cabal, MD;  Location: [place] [facility];  Service: Cardiovascular;  Laterality: N/A;  ? ESOPHAGOGASTRODUODENOSCOPY N/A 03/08/2015  ? Procedure: ESOPHAGOGASTRODUODENOSCOPY (EGD);  Surgeon: Manya Silvas, MD;  Location: [facility] ENDOSCOPY;  Service: Endoscopy;  Laterality: N/A;  ? ESOPHAGOGASTRODUODENOSCOPY (EGD) WITH PROPOFOL N/A 03/18/2016  ? Procedure: ESOPHAGOGASTRODUODENOSCOPY (EGD) WITH PROPOFOL;  Surgeon: [name], MD;  Location: [facility] ENDOSCOPY;  Service: Endoscopy;  Laterality: N/A;  ? EYE  SURGERY Right 2018  ? FECAL TRANSPLANT N/A 08/23/2015  ? Procedure: FECAL TRANSPLANT;  Surgeon: Manya Silvas, MD;  Location: [facility] ENDOSCOPY;  Service: Endoscopy;  Laterality: N/A;  ? HAND SURGERY Bilateral   ? HEMATOMA EVACUATION Left 11/25/2019  ? Procedure: EVACUATION HEMATOMA;  Surgeon: Katha Cabal, MD;  Location: [facility];  Service: Vascular;  Laterality: Left;  ? I & D EXTREMITY Left 11/25/2019  ? Procedure: IRRIGATION AND DEBRIDEMENT EXTREMITY;  Surgeon: Katha Cabal, MD;  Location: [facility];  Service: Vascular;  Laterality: Left;  ? IR FLUORO GUIDE CV LINE RIGHT  04/06/2020  ? IR INJECT/THERA/INC NEEDLE/CATH/PLC EPI/CERV/THOR W/IMG  08/13/2020  ? IR RADIOLOGIST EVAL & MGMT  07/28/2019  ? IR RADIOLOGIST EVAL & MGMT  08/11/2019  ? LIGATION OF ARTERIOVENOUS  FISTULA Left 11/11/2019  ? LIGATION OF ARTERIOVENOUS  FISTULA Left 11/11/2019  ? Procedure: LIGATION  OF ARTERIOVENOUS  FISTULA;  Surgeon: Katha Cabal, MD;  Location: [facility] OR[facility];  Service: Vascular;  Laterality: Left;  ? LIGATIONS OF HERO GRAFT Right 06/13/2020  ? Procedure: LIGATION / REMOVAL OF RIGHT HERO GRAFT;  Surgeon: Katha Cabal, MD;  Location: [facility];  Service: Vascular;  Laterality: Right;  ? PERIPHERAL VASCULAR CATHETERIZATION N/A 12/20/2015  ? Procedure: Thrombectomy of dialysis access versus permcath placement;  Surgeon: Algernon Huxley, MD;  Location: [place] [facility];  Service: Cardiovascular;  Laterality: N/A;  ? PERIPHERAL VASCULAR CATHETERIZATION N/A 12/20/2015  ? Procedure: A/V Shunt Intervention;  Surgeon: Algernon Huxley, MD;  Location: [place] [facility];  Service: Cardiovascular;  Laterality: N/A;  ? PERIPHERAL VASCULAR CATHETERIZATION N/A 12/20/2015  ? Procedure: A/V Shuntogram/Fistulagram;  Surgeon: Algernon Huxley, MD;  Location: [place] [facility];  Service: Cardiovascular;  Laterality: N/A;  ? PERIPHERAL VASCULAR CATHETERIZATION N/A 01/02/2016  ? Procedure: A/V Shuntogram/Fistulagram;  Surgeon: Algernon Huxley, MD;   Location: [place] [facility];  Service: Cardiovascular;  Laterality: N/A;  ? PERIPHERAL VASCULAR CATHETERIZATION N/A 01/02/2016  ? Procedure: A/V Shunt Intervention;  Surgeon: Algernon Huxley, MD;  Location: [facility]

## 2021-12-04 NOTE — Progress Notes (Signed)
?[facility]  ?ROUNDING NOTE  ? ?Subjective:  ? ?[name] is a 61 year old female with past medical history including diabetes, chronic diastolic heart failure, PVD, CAD, chronic respiratory failure with 2 L oxygen used at home, and end-stage renal disease on hemodialysis.  Patient presents to the emergency department with complaints of diarrhea, nausea, weakness and chills.  Patient has been admitted for under observation for Myalgia [M79.10] ?Pneumonia [J18.9] ?Community acquired pneumonia of left lower lobe of lung [J18.9] ?Bacterial pneumonia [J15.9] ? ?Patient is known to our practice and receives outpatient dialysis at [facility] on a MWF schedule, supervised by Dr Candiss Norse.  ? ?Patient says she feels a little better today. ?Appetite remains poor ?Weakness remains ?Non productive cougth ? ?Dialysis later today ? ?Objective:  ?Vital signs in last 24 hours:  ?Temp:  [97.7 ?F (36.5 ?C)-99.1 ?F (37.3 ?C)] 98.1 ?F (36.7 ?C) (03/15 9767) ?Pulse Rate:  [66-74] 69 (03/15 0821) ?Resp:  [18] 18 (03/15 3419) ?BP: (144-153)/(54-86) 144/86 (03/15 3790) ?SpO2:  [95 %-100 %] 99 % (03/15 0821) ? ?Weight change:  ?Filed Weights  ? 11/29/21 1641 12/02/21 1530 12/02/21 1830  ?Weight: 75.4 kg 77.9 kg 77.3 kg  ? ? ?Intake/Output: ?No intake/output data recorded. ?  ?Intake/Output this shift: ? No intake/output data recorded. ? ?Physical Exam: ?General: NAD  ?Head: Normocephalic, atraumatic. Moist oral mucosal membranes  ?Eyes: Anicteric  ?Lungs:  Basilar crackles, normal effort, 2 L [place]  ?Heart: Regular rate and rhythm  ?Abdomen:  Soft, nontender  ?Extremities: Trace peripheral edema  ?Neurologic: Nonfocal, moving all four extremities  ?Skin: No lesions  ?Access: Right PermCath  ? ? ?Basic Metabolic Panel: ?Recent Labs  ?Lab 11/29/21 ?1405 12/03/21 ?2409  ?NA 131* 130*  ?K 4.2 3.5  ?CL 93* 92*  ?CO2 27 29  ?GLUCOSE 78 161*  ?BUN 24* 12  ?CREATININE 6.10* 4.32*  ?CALCIUM 8.0* 8.2*  ?PHOS 6.6* 4.9*  ? ? ? ?Liver  Function Tests: ?Recent Labs  ?Lab 11/29/21 ?1405 12/03/21 ?7353  ?ALBUMIN 2.8* 2.9*  ? ? ?No results for input(s): LIPASE, AMYLASE in the last 168 hours. ?No results for input(s): AMMONIA in the last 168 hours. ? ?CBC: ?Recent Labs  ?Lab 11/29/21 ?1405  ?WBC 4.4  ?HGB 12.3  ?HCT 39.1  ?MCV 88.3  ?PLT 282  ? ? ? ?Cardiac Enzymes: ?No results for input(s): CKTOTAL, CKMB, CKMBINDEX, TROPONINI in the last 168 hours. ? ?BNP: ?Invalid input(s): POCBNP ? ?CBG: ?Recent Labs  ?Lab 11/30/21 ?1627 12/01/21 ?0105 12/01/21 ?0902 12/01/21 ?1140 12/01/21 ?1600  ?GLUCAP 213* 184* 127* 120* 136*  ? ? ? ?Microbiology: ?Results for orders placed or performed during the hospital encounter of 11/27/21  ?Resp Panel by RT-PCR (Flu A&B, Covid) Nasopharyngeal Swab     Status: Abnormal  ? Collection Time: 11/27/21  3:54 AM  ? Specimen: Nasopharyngeal Swab; Nasopharyngeal(NP) swabs in vial transport medium  ?Result Value Ref Range Status  ? SARS Coronavirus 2 by RT PCR POSITIVE (A) NEGATIVE Final  ?  Comment: (NOTE) ?SARS-CoV-2 target nucleic acids are DETECTED. ? ?The SARS-CoV-2 RNA is generally detectable in upper respiratory ?specimens during the acute phase of infection. Positive results are ?indicative of the presence of the identified virus, but do not rule ?out bacterial infection or co-infection with other pathogens not ?detected by the test. Clinical correlation with patient history and ?other diagnostic information is necessary to determine patient ?infection status. The expected result is Negative. ? ?Fact Sheet for Patients: ?EntrepreneurPulse.com.au ? ?Fact Sheet  for Healthcare Providers: ?IncredibleEmployment.be ? ?This test is not yet approved or cleared by the [place] FDA and  ?has been authorized for detection and/or diagnosis of SARS-CoV-2 by ?FDA under an Emergency Use Authorization (EUA).  This EUA will ?remain in effect (meaning this test can be used) for the duration of  ?the  COVID-19 declaration under Section 564(b)(1) of the A ct, 21 ?U.S.C. section 360bbb-3(b)(1), unless the authorization is ?terminated or revoked sooner. ? ?  ? Influenza A by PCR NEGATIVE NEGATIVE Final  ? Influenza B by PCR NEGATIVE NEGATIVE Final  ?  Comment: (NOTE) ?The Xpert Xpress SARS-CoV-2/FLU/RSV plus assay is intended as an aid ?in the diagnosis of influenza from Nasopharyngeal swab specimens and ?should not be used as a sole basis for treatment. Nasal washings and ?aspirates are unacceptable for Xpert Xpress SARS-CoV-2/FLU/RSV ?testing. ? ?Fact Sheet for Patients: ?EntrepreneurPulse.com.au ? ?Fact Sheet for Healthcare Providers: ?IncredibleEmployment.be ? ?This test is not yet approved or cleared by the [place] FDA and ?has been authorized for detection and/or diagnosis of SARS-CoV-2 by ?FDA under an Emergency Use Authorization (EUA). This EUA will remain ?in effect (meaning this test can be used) for the duration of the ?COVID-19 declaration under Section 564(b)(1) of the Act, 21 U.S.C. ?section 360bbb-3(b)(1), unless the authorization is terminated or ?revoked. ? ?Performed at [facility], [place], ?[place] [address] ?  ?Blood culture (single)     Status: None  ? Collection Time: 11/27/21  5:04 AM  ? Specimen: BLOOD  ?Result Value Ref Range Status  ? Specimen Description BLOOD LEFT FA  Final  ? Special Requests   Final  ?  BOTTLES DRAWN AEROBIC AND ANAEROBIC Blood Culture adequate volume  ? Culture   Final  ?  NO GROWTH 5 DAYS ?Performed at [facility], [address], [place], [place] [address] ?  ? Report Status 12/02/2021 FINAL  Final  ?Expectorated Sputum Assessment w Gram Stain, Rflx to Resp Cult     Status: None  ? Collection Time: 11/27/21  6:57 AM  ? Specimen: Expectorated Sputum  ?Result Value Ref Range Status  ? Specimen Description EXPECTORATED SPUTUM  Final  ? Special Requests NONE  Final  ? Sputum evaluation    Final  ?  THIS SPECIMEN IS ACCEPTABLE FOR SPUTUM CULTURE ?Performed at [facility], [address] [place], [place] [address] ?  ? Report Status 11/27/2021 FINAL  Final  ?Culture, Respiratory w Gram Stain     Status: None  ? Collection Time: 11/27/21  6:57 AM  ?Result Value Ref Range Status  ? Specimen Description   Final  ?  EXPECTORATED SPUTUM ?Performed at [facility], [address] [place], [place] [address] ?  ? Special Requests   Final  ?  NONE Reflexed from [id number] ?Performed at [facility], [address] [place], [place] [address] ?  ? Gram Stain   Final  ?  MODERATE SQUAMOUS EPITHELIAL CELLS PRESENT ?FEW WBC PRESENT,BOTH PMN AND MONONUCLEAR ?FEW BUDDING YEAST SEEN ?FEW GRAM POSITIVE COCCI ?FEW GRAM NEGATIVE RODS ?Performed at [facility], [place] [address], [place], [place] [address] ?  ? Culture   Final  ?  ABUNDANT ESCHERICHIA COLI ?Confirmed Extended Spectrum Beta-Lactamase Producer (ESBL).  In bloodstream infections from ESBL organisms, carbapenems are preferred over piperacillin/tazobactam. They are shown to have a lower risk of mortality. ?  ? Report Status 11/30/2021 FINAL  Final  ? Organism ID, Bacteria ESCHERICHIA  COLI  Final  ?    Susceptibility  ? Escherichia coli - MIC*  ?  AMPICILLIN >=32 RESISTANT Resistant   ?  CEFAZOLIN >=64 RESISTANT Resistant   ?  CEFEPIME >=32 RESISTANT Resistant   ?  CEFTAZIDIME RESISTANT Resistant   ?  CEFTRIAXONE >=64 RESISTANT Resistant   ?  CIPROFLOXACIN >=4 RESISTANT Resistant   ?  GENTAMICIN <=1 SENSITIVE Sensitive   ?  IMIPENEM <=0.25 SENSITIVE Sensitive   ?  TRIMETH/SULFA <=20 SENSITIVE Sensitive   ?  AMPICILLIN/SULBACTAM >=32 RESISTANT Resistant   ?  PIP/TAZO 64 INTERMEDIATE Intermediate   ?  * ABUNDANT ESCHERICHIA COLI  ? ? ?Coagulation Studies: ?No results for input(s): LABPROT, INR in the last 72 hours. ? ?Urinalysis: ?No results for input(s): COLORURINE, LABSPEC, [place], GLUCOSEU, HGBUR, BILIRUBINUR, KETONESUR,  PROTEINUR, UROBILINOGEN, NITRITE, LEUKOCYTESUR in the last 72 hours. ? ?Invalid input(s): APPERANCEUR  ? ? ?Imaging: ?DG Chest 2 View ? ?Result Date: 12/02/2021 ?CLINICAL DATA:  Cough EXAM: CHEST - 2 VIEW COMPARISON:

## 2021-12-04 NOTE — Progress Notes (Signed)
? ?[facility] ?PROGRESS NOTE ? ? ?[name] ZGY:[id number] DOB: [birth year] DOA: 11/27/2021  6 ?DOS: the patient was seen and examined on 12/04/2021 ? ?PCP: Denton Lank, MD ? ?Brief History and Hospital Course:  ?61 year old female with medical history significant for ESRD on TTS HD, type II DM, chronic diastolic CHF, CAD, PAD, chronic respiratory failure on home oxygen 2 L/min, COPD, anemia, HLD, GERD, hard of hearing, presented to the ED with 7 to 10 days of cough, chills, weakness and myalgia.  Reported testing positive for COVID 19 at the dialysis center more than a week ago.  Denies dyspnea or chest pain.  Developed self-limiting diarrhea 3 days PTA without nausea, vomiting or abdominal pain.  Admitted for bacterial pneumonia superimposed on recent COVID-19 infection.  Nephrology consulted for HD needs.  On 3/9, patient obtunded, rapid response initiated, improved after a dose of Narcan, opioids discontinued.  Completed 5 days course of IV antibiotics for pneumonia.  Although clinically improved, persistent bothersome cough, chest x-ray shows worsening infiltrates, ID consulted (no interventions recommended, pulmonology consulted and getting CT chest to further evaluate. ? ?Consultants: Nephrology.  Pulmonology ? ?Procedures: Hemodialysis ? ? ? ?Subjective: ?Patient continues to have cough.  Otherwise she is feeling better.  Has been having hearing issues since her diagnosis of COVID.  Denies any ear pain. ? ? ? ?Assessment/Plan: ? ? ?* Bacterial pneumonia ?Patient with productive cough and abnormal chest x-ray findings. Procalcitonin elevated but unclear significance in the context of ESRD patient. ?Completed total 5 days of IV antibiotics including ceftriaxone and azithromycin ?Blood cultures negative to date.  Sputum culture showed E. coli, MDR except to gentamicin, imipenem and Bactrim. ?As per communication with pharmacy, patient has clinically improved despite current antibiotics not covering for E.  coli and hence completed ceftriaxone.  Seen by infectious disease who recommended continuing observation off of antibiotics.   ?Patient seen by pulmonology.  CT of the chest was done which showed Interlobular septal thickening, scattered ground-glass airspace disease, and small bilateral effusions consistent with pulmonary edema.  ?Patient has ongoing symptoms likely due to lingering COVID as well as bacterial infection.  She was told that the cough or take a long time to get better.  We will wait for pulmonology input today. ?Patient started on gabapentin nightly for her cough.  Remains on Tessalon as well.  Continue with incentive spirometry.   ? ?COVID-19 virus infection ?Per patient report, tested positive for COVID-19 at the outpatient dialysis center more than a week ago. ?She tested positive in the hospital.  Respiratory status is stable except for her ongoing cough.  Findings noted on CT chest could be from her recent infection.  CRP is 2.6. ?Patient was not given any steroids or antiviral treatment at admission. ? ?Chronic respiratory failure with hypoxia ([place]) ?2 L/min O2 requirement at baseline. ?Respiratory status is stable. ? ?Unresponsiveness ?Acute toxic metabolic encephalopathy ?On 3/9, RN alerted that patient was barely arousable, respiratory rate of 10. ?Ordered Narcan, ABG and advised to activate rapid response team. ?Improved after dose of Narcan. ?Likely related to Vicodin, Tussionex complicating dialysis patient. ?Discontinued all opioids and minimize use of same. ?Encephalopathy resolved. ? ?End-stage renal disease on hemodialysis ([place]) ?Nephrology is following.  Dialyzed on a Tuesday Thursday Saturday schedule.   ? ?Hyponatremia ?Labs are stable. ? ?HTN (hypertension) ?Continue amlodipine, hydralazine.  Blood pressure reasonably well controlled.  We will avoid aggressive management as she is on dialysis. ? ?Diabetes mellitus, type II ([place]) ?Had some hypoglycemic  episodes and CBGs monitored  closely, no further episodes. ?Not on insulins.   ?A1c well controlled at 6.2 on 11/15/2021 ?CBGs were discontinued. ? ?Chronic diastolic CHF (congestive heart failure) ([place]) ?Compensated. ?Volume management across HD.   ? ?Hearing impairment ?Present ever since diagnosis of COVID.  Denies any ear pain.  Will need to be seen by ENT in the outpatient setting.  Will need audiological testing. ? ?Physical debility ?Patient noted to be profoundly weak.  No obvious focal neurological deficits noted.  This is all likely effects of recent infection along with COVID.  Seen by physical therapy.  Skilled nursing facility is recommended.  Hopefully discharge in 24 to 48 hours.   ? ?Coronary artery disease involving native coronary artery of native heart without angina pectoris ?Continue aspirin and atorvastatin with nitroglycerin as needed chest pain.  Cardiac status is stable. ? ? ? ?Obesity ?Estimated body mass index is 31.17 kg/m? as calculated from the following: ?  Height as of this encounter: '5\' 2"'$  (1.575 m). ?  Weight as of this encounter: 77.3 kg. ? ?DVT Prophylaxis: Subcutaneous heparin ?Code Status: Full code ?Family Communication: Discussed with patient ?Disposition Plan: SNF when medically stable ? ?Status is: Inpatient ?Remains inpatient appropriate because: respiratory failure, cough, ? ? ? ? ?Medications: Scheduled: ? albuterol  2 puff Inhalation Q6H  ? amLODipine  10 mg Oral Daily  ? aspirin EC  81 mg Oral Daily  ? atorvastatin  20 mg Oral QPM  ? benzonatate  200 mg Oral TID  ? Chlorhexidine Gluconate Cloth  6 each Topical Q0600  ? cholecalciferol  1,000 Units Oral Daily  ? feeding supplement  1 Container Oral TID BM  ? feeding supplement (NEPRO CARB STEADY)  237 mL Oral BID BM  ? gabapentin  100 mg Oral QHS  ? heparin  5,000 Units Subcutaneous Q8H  ? mirtazapine  7.5 mg Oral QHS  ? multivitamin  1 tablet Oral QHS  ? pantoprazole  40 mg Oral BID  ? sevelamer carbonate  800 mg Oral TID WC & HS  ? cyanocobalamin   1,000 mcg Oral Daily  ? ?Continuous: ?IDP:OEUMPNTIRWERX, calcium carbonate, fluticasone, guaiFENesin-dextromethorphan, menthol-cetylpyridinium, naLOXone (NARCAN)  injection, nitroGLYCERIN, polyethylene glycol, promethazine ? ?Antibiotics: ?Anti-infectives (From admission, onward)  ? ? Start     Dose/Rate Route Frequency Ordered Stop  ? 11/30/21 1000  azithromycin (ZITHROMAX) tablet 500 mg  Status:  Discontinued       ? 500 mg Oral Daily 11/30/21 0909 11/30/21 1111  ? 11/28/21 1000  azithromycin (ZITHROMAX) 500 mg in sodium chloride 0.9 % 250 mL IVPB  Status:  Discontinued       ? 500 mg ?250 mL/hr over 60 Minutes Intravenous Every 24 hours 11/27/21 0716 11/30/21 0909  ? 11/28/21 0800  cefTRIAXone (ROCEPHIN) 1 g in sodium chloride 0.9 % 100 mL IVPB       ? 1 g ?200 mL/hr over 30 Minutes Intravenous Every 24 hours 11/27/21 0716 12/01/21 0857  ? 11/27/21 0445  cefTRIAXone (ROCEPHIN) 1 g in sodium chloride 0.9 % 100 mL IVPB       ? 1 g ?200 mL/hr over 30 Minutes Intravenous  Once 11/27/21 0438 11/27/21 0558  ? 11/27/21 0445  azithromycin (ZITHROMAX) 500 mg in sodium chloride 0.9 % 250 mL IVPB       ? 500 mg ?250 mL/hr over 60 Minutes Intravenous  Once 11/27/21 0438 11/27/21 0819  ? ?  ? ? ?Objective: ? ?Vital Signs ? ?Vitals:  ?  12/03/21 1700 12/03/21 2003 12/04/21 0517 12/04/21 0821  ?BP: (!) 146/56 (!) 153/54 (!) 148/60 (!) 144/86  ?Pulse: 74 72 66 69  ?Resp: '18 18 18 18  '$ ?Temp: 99.1 ?F (37.3 ?C) 98.4 ?F (36.9 ?C) 97.7 ?F (36.5 ?C) 98.1 ?F (36.7 ?C)  ?TempSrc: Oral Oral Oral Oral  ?SpO2: 99% 95% 100% 99%  ?Weight:      ?Height:      ? ? ?Intake/Output Summary (Last 24 hours) at 12/04/2021 1043 ?Last data filed at 12/03/2021 2300 ?Gross per 24 hour  ?Intake 0 ml  ?Output --  ?Net 0 ml  ? ?Filed Weights  ? 11/29/21 1641 12/02/21 1530 12/02/21 1830  ?Weight: 75.4 kg 77.9 kg 77.3 kg  ? ? ?General appearance: Awake alert.  In no distress ?Resp: Normal effort at rest.  Diminished air entry at the bases with few crackles  bilaterally.  No wheezing or rhonchi. ?Cardio: S1-S2 is normal regular.  No S3-S4.  No rubs murmurs or bruit ?GI: Abdomen is soft.  Nontender nondistended.  Bowel sounds are present normal.  No masses organo

## 2021-12-04 NOTE — Assessment & Plan Note (Signed)
Present ever since diagnosis of COVID.  Denies any ear pain.  Will need to be seen by ENT in the outpatient setting.  Will need audiological testing. ?

## 2021-12-04 NOTE — Plan of Care (Signed)
  Problem: Activity: Goal: Risk for activity intolerance will decrease Outcome: Progressing   Problem: Nutrition: Goal: Adequate nutrition will be maintained Outcome: Progressing   Problem: Coping: Goal: Level of anxiety will decrease Outcome: Progressing   

## 2021-12-04 NOTE — Progress Notes (Signed)
Echo was ordered for the patient. Echo techs came to the room for the test and the patient refused because she had just received her lunch. A couple minutes later, the patient called out and said that she would go through with the Echo since she wasn't going to be able to eat her food. The RN called Echo back to let them know that the patient changed her mind. She informed me that they were in another room and they would not be back until tomorrow most likely.  ?

## 2021-12-04 NOTE — Progress Notes (Signed)
Post Hemodialysis Treatment Note ?Tx Date: December 04, 2021 ?Tx Time: 3 hours scheduled  ?Access: Right Subclavian Tunneled Catheter  ?UF Removed:  ?Next Scheduled Tx:  17 March Inpatient ?Note:   ?Patient with scheduled 2.5 hr. hemodialysis treatment. Catheter intact, no signs of infection.  Goals hemodialysis treatment met, blood flow rate maintains during the entirety of treatment, targeted UF achieved with 1-liter fluid removal. Patient medicated with Tylenol for complaint of shoulder pain, which eased pain. Patient returned to assigned room.   ? ?

## 2021-12-05 MED ORDER — ALUM & MAG HYDROXIDE-SIMETH 200-200-20 MG/5ML PO SUSP
30.0000 mL | ORAL | Status: DC | PRN
  Administered 2021-12-05: 30 mL via ORAL
  Filled 2021-12-05: qty 30

## 2021-12-05 MED ORDER — SODIUM CHLORIDE 0.9 % IV SOLN
500.0000 mg | INTRAVENOUS | Status: DC
  Administered 2021-12-05: 500 mg via INTRAVENOUS
  Filled 2021-12-05: qty 10

## 2021-12-05 NOTE — Progress Notes (Signed)
Pharmacy Antibiotic Note ? ?[name] is a 61 y.o. female w/ PMH of ESRD on HD TTS, DM, chronic diastolic heart failure, CAD, PVD, Chronic respiratory failure on home O2 at 2 L admitted on 11/27/2021 with pneumonia.  Pharmacy has been consulted for meropenem dosing following a tracheal aspirate that has grown out ESBL E coli. ? ?Plan: start meropenem 500 mg IV every 24 hours ? ?Height: '5\' 2"'$  (157.5 cm) ?Weight: 80.5 kg (177 lb 7.5 oz) ?IBW/kg (Calculated) : 50.1 ? ?Temp (24hrs), Avg:98.1 ?F (36.7 ?C), Min:97.7 ?F (36.5 ?C), Max:98.5 ?F (36.9 ?C) ? ?Recent Labs  ?Lab 11/29/21 ?1405 12/03/21 ?7517  ?WBC 4.4  --   ?CREATININE 6.10* 4.32*  ?  ?Estimated Creatinine Clearance: 13.3 mL/min (A) (by C-G formula based on SCr of 4.32 mg/dL (H)).   ? ?Allergies  ?Allergen Reactions  ? Ace Inhibitors Swelling and Anaphylaxis  ? Ativan [Lorazepam] Other (See Comments)  ?  Reaction:  Hallucinations and headaches  ? Compazine [Prochlorperazine Edisylate] Anaphylaxis, Nausea And Vomiting and Other (See Comments)  ?  Other reaction(s): dystonia from this vs. Reglan, 23 Jul - patient relates that she takes promethazine frequently with no problems  ? Sumatriptan Succinate Other (See Comments)  ?  Other reaction(s): delirium and hallucinations per [facility] records  ? Zofran [Ondansetron] Nausea And Vomiting  ?  Per pt. she is allergic to zofran or will experience adverse reaction like hallucination   ? Losartan Nausea Only  ? Prochlorperazine Other (See Comments)  ?  Reaction:  Unknown . Patient does not remember reaction but she does have vertigo and anxiety along with n and v at times. Could be used to treat any of these ?  ? Reglan [Metoclopramide] Other (See Comments)  ?  Per patient her Dr. Evelina Bucy her off it   ? Scopolamine Other (See Comments)  ?  Dizziness, also has vertigo already  ? Tape Rash  ?  Plastic tape causes rash  ? Tapentadol Rash  ? Ultrasound Gel Itching  ?  Patient states the Ultrasound gel caused itching while in skin  contact and resolved when wiped away.  ? ? ?Antimicrobials this admission: ?03/08 ceftriaxone >> 03/12 ?03/08 azithromycin >> 03/12 ?03/16 meropenem >>  ? ?Microbiology results: ?03/08 BCx: NG final ?03/08 Sputum: ESBL E coli ?03/08 SARS CoV-2: positive  ? ?Thank you for allowing pharmacy to be a part of this patient?s care. ? ?Dallie Piles ?12/05/2021 6:08 PM ? ?

## 2021-12-05 NOTE — TOC Progression Note (Addendum)
Transition of Care (TOC) - Progression Note  ? ? ?Patient Details  ?Name: [name] ?MRN: [id number] ?Date of Birth: October 28, 1960 ? ?Transition of Care (TOC) CM/SW Contact  ?Candie Chroman, LCSW ?Phone Number: ?12/05/2021, 12:16 PM ? ?Clinical Narrative:  Per MD, likely discharge tomorrow. Son is aware so will move forward with [facility]. Started auth through [facility] portal. Sent 3/1 COVID results to admissions coordinator. Son will come visit her today and provide update. ? ?  ?Barriers to Discharge: SNF Pending bed offer ? ?Expected Discharge Plan and Services ?  ?  ?  ?  ?  ?                ?  ?  ?  ?  ?  ?  ?  ?  ?  ?  ? ? ?Social Determinants of Health (SDOH) Interventions ?  ? ?Readmission Risk Interventions ?Readmission Risk Prevention Plan 05/03/2021 12/11/2020 09/21/2020  ?Transportation Screening Complete Complete Complete  ?Medication Review Press photographer) Complete Complete Complete  ?PCP or Specialist appointment within 3-5 days of discharge - Complete Complete  ?[place] or Home Care Consult - Complete Complete  ?SW Recovery Care/Counseling Consult - Complete Complete  ?Palliative Care Screening Complete Not Applicable Complete  ?Skilled Nursing Facility - Not Complete Complete  ?SNF Comments - working on placement -  ?Some recent data might be hidden  ? ? ?

## 2021-12-05 NOTE — Progress Notes (Signed)
? ?[facility] ?PROGRESS NOTE ? ? ?[name] FYB:[id number] DOB: 18-May-1961 DOA: 11/27/2021  7 ?DOS: the patient was seen and examined on 12/05/2021 ? ?PCP: Denton Lank, MD ? ?Brief History and Hospital Course:  ?61 year old female with medical history significant for ESRD on TTS HD, type II DM, chronic diastolic CHF, CAD, PAD, chronic respiratory failure on home oxygen 2 L/min, COPD, anemia, HLD, GERD, hard of hearing, presented to the ED with 7 to 10 days of cough, chills, weakness and myalgia.  Reported testing positive for COVID 19 at the dialysis center more than a week ago.  Denies dyspnea or chest pain.  Developed self-limiting diarrhea 3 days PTA without nausea, vomiting or abdominal pain.  Admitted for bacterial pneumonia superimposed on recent COVID-19 infection.  Nephrology consulted for HD needs.  On 3/9, patient obtunded, rapid response initiated, improved after a dose of Narcan, opioids discontinued.  Completed 5 days course of IV antibiotics for pneumonia.  Although clinically improved, persistent bothersome cough, chest x-ray shows worsening infiltrates, ID consulted (no interventions recommended, pulmonology consulted and getting CT chest to further evaluate.  Patient remains stable for the most part. ? ?Consultants: Nephrology.  Pulmonology ? ?Procedures: Hemodialysis ? ? ? ?Subjective: ?Patient mentions that she is feeling better.  Cough is better.  No other complaints offered.   ? ?Assessment/Plan: ? ? ?* Bacterial pneumonia ?Patient with productive cough and abnormal chest x-ray findings. Procalcitonin elevated but unclear significance in the context of ESRD patient. ?Completed total 5 days of IV antibiotics including ceftriaxone and azithromycin ?Blood cultures negative to date.  Sputum culture showed E. coli, MDR except to gentamicin, imipenem and Bactrim. ?As per communication with pharmacy, patient has clinically improved despite current antibiotics not covering for E. coli and hence  completed ceftriaxone.  Seen by infectious disease who recommended continuing observation off of antibiotics.   ?Patient seen by pulmonology.  CT of the chest was done which showed Interlobular septal thickening, scattered ground-glass airspace disease, and small bilateral effusions consistent with pulmonary edema.  ?Patient has ongoing symptoms likely due to lingering COVID as well as bacterial infection.  She was told that the cough or take a long time to get better.  No further recommendations from pulmonology. ?Patient started on gabapentin nightly for her cough.  Remains on Tessalon as well.  Continue with incentive spirometry.   ?Seems to be gradually improving. ? ?COVID-19 virus infection ?Per patient report, tested positive for COVID-19 at the outpatient dialysis center more than a week ago. ?She tested positive in the hospital.  Respiratory status is stable except for her ongoing cough.  Findings noted on CT chest could be from her recent infection.  CRP is 2.6. ?Patient was not given any steroids or antiviral treatment at admission. ?Stable. ? ?Chronic respiratory failure with hypoxia ([place]) ?Respiratory status is stable.  2 L/min O2 requirement at baseline. ? ? ?Unresponsiveness ?Acute toxic metabolic encephalopathy ?On 3/9, RN alerted that patient was barely arousable, respiratory rate of 10. ?Ordered Narcan, ABG and advised to activate rapid response team. ?Improved after dose of Narcan. ?Likely related to Vicodin, Tussionex complicating dialysis patient. ?Discontinued all opioids and minimize use of same. ?Encephalopathy resolved. ? ?End-stage renal disease on hemodialysis ([place]) ?Nephrology is following.  Looks like she is on a Monday Wednesday Friday schedule.   ? ?Hyponatremia ?Labs are stable. ? ?HTN (hypertension) ?Blood pressure is reasonably well controlled.  She is noted to be on amlodipine and hydralazine.   ?We will avoid aggressive management  as she is on dialysis. ? ?Diabetes mellitus, type  II ([place]) ?Had some hypoglycemic episodes and CBGs monitored closely, no further episodes. ?Not on insulins.   ?A1c well controlled at 6.2 on 11/15/2021 ?CBGs were discontinued. ? ?Chronic diastolic CHF (congestive heart failure) ([place]) ?Compensated. ?Volume management via HD.   ? ?Hearing impairment ?Present ever since diagnosis of COVID.  Denies any ear pain.  Will need to be seen by ENT in the outpatient setting.  Will need audiological testing. ? ?Pericardial effusion ?Moderate pericardial effusion noted on echocardiogram done on 12/04/2021.  Previous echocardiograms did not show similar findings.  No evidence for tamponade.  Can likely be monitored in the outpatient setting.  Will discuss with her cardiologist, Dr. Nehemiah Massed. ? ?Physical debility ?Patient noted to be profoundly weak.  No obvious focal neurological deficits noted.  This is all likely effects of recent infection along with COVID.  Seen by physical therapy.  Skilled nursing facility is recommended.  Hopefully discharge in 24 to 48 hours.   ? ?Coronary artery disease involving native coronary artery of native heart without angina pectoris ?Continue aspirin and atorvastatin with nitroglycerin as needed chest pain.  Cardiac status is stable. ?Echocardiogram showed EF to be 60 to 65% without any regional wall motion abnormalities.  Moderate pericardial effusion was noted.  See below. ? ? ? ?Obesity ?Estimated body mass index is 32.46 kg/m? as calculated from the following: ?  Height as of this encounter: '5\' 2"'$  (1.575 m). ?  Weight as of this encounter: 80.5 kg. ? ?DVT Prophylaxis: Subcutaneous heparin ?Code Status: Full code ?Family Communication: Discussed with patient ?Disposition Plan: SNF when medically stable ? ?Status is: Inpatient ?Remains inpatient appropriate because: respiratory failure, cough, ? ? ? ? ?Medications: Scheduled: ? albuterol  2 puff Inhalation Q6H  ? amLODipine  10 mg Oral Daily  ? aspirin EC  81 mg Oral Daily  ? atorvastatin  20 mg  Oral QPM  ? benzonatate  200 mg Oral TID  ? Chlorhexidine Gluconate Cloth  6 each Topical Q0600  ? cholecalciferol  1,000 Units Oral Daily  ? feeding supplement  1 Container Oral TID BM  ? feeding supplement (NEPRO CARB STEADY)  237 mL Oral BID BM  ? gabapentin  100 mg Oral QHS  ? heparin  5,000 Units Subcutaneous Q8H  ? mirtazapine  7.5 mg Oral QHS  ? multivitamin  1 tablet Oral QHS  ? pantoprazole  40 mg Oral BID  ? sevelamer carbonate  800 mg Oral TID WC & HS  ? cyanocobalamin  1,000 mcg Oral Daily  ? ?Continuous: ?XBW:IOMBTDHRCBULA, alum & mag hydroxide-simeth, calcium carbonate, fluticasone, guaiFENesin-dextromethorphan, menthol-cetylpyridinium, naLOXone (NARCAN)  injection, nitroGLYCERIN, polyethylene glycol, promethazine ? ?Antibiotics: ?Anti-infectives (From admission, onward)  ? ? Start     Dose/Rate Route Frequency Ordered Stop  ? 11/30/21 1000  azithromycin (ZITHROMAX) tablet 500 mg  Status:  Discontinued       ? 500 mg Oral Daily 11/30/21 0909 11/30/21 1111  ? 11/28/21 1000  azithromycin (ZITHROMAX) 500 mg in sodium chloride 0.9 % 250 mL IVPB  Status:  Discontinued       ? 500 mg ?250 mL/hr over 60 Minutes Intravenous Every 24 hours 11/27/21 0716 11/30/21 0909  ? 11/28/21 0800  cefTRIAXone (ROCEPHIN) 1 g in sodium chloride 0.9 % 100 mL IVPB       ? 1 g ?200 mL/hr over 30 Minutes Intravenous Every 24 hours 11/27/21 0716 12/01/21 0857  ? 11/27/21 0445  cefTRIAXone (ROCEPHIN)  1 g in sodium chloride 0.9 % 100 mL IVPB       ? 1 g ?200 mL/hr over 30 Minutes Intravenous  Once 11/27/21 0438 11/27/21 0558  ? 11/27/21 0445  azithromycin (ZITHROMAX) 500 mg in sodium chloride 0.9 % 250 mL IVPB       ? 500 mg ?250 mL/hr over 60 Minutes Intravenous  Once 11/27/21 0438 11/27/21 0819  ? ?  ? ? ?Objective: ? ?Vital Signs ? ?Vitals:  ? 12/04/21 2200 12/04/21 2214 12/05/21 0502 12/05/21 1011  ?BP: (!) 168/66  (!) 140/50 (!) 147/63  ?Pulse: 74 73 62 67  ?Resp: (!) '24 18 17 20  '$ ?Temp: 97.7 ?F (36.5 ?C)  98.3 ?F (36.8 ?C)  98.2 ?F (36.8 ?C)  ?TempSrc: Oral  Oral Oral  ?SpO2: 100% 99% 100% 99%  ?Weight:      ?Height:      ? ? ?Intake/Output Summary (Last 24 hours) at 12/05/2021 1136 ?Last data filed at 12/04/2021 2145 ?Gross pe

## 2021-12-05 NOTE — Plan of Care (Signed)
  Problem: Activity: Goal: Risk for activity intolerance will decrease Outcome: Progressing   Problem: Nutrition: Goal: Adequate nutrition will be maintained Outcome: Progressing   Problem: Coping: Goal: Level of anxiety will decrease Outcome: Progressing   Problem: Pain Managment: Goal: General experience of comfort will improve Outcome: Progressing   Problem: Safety: Goal: Ability to remain free from injury will improve Outcome: Progressing   

## 2021-12-05 NOTE — Assessment & Plan Note (Addendum)
Moderate pericardial effusion noted on echocardiogram done on 12/04/2021.  Previous echocardiograms did not show similar findings.  No evidence for tamponade.  Can likely be monitored in the outpatient setting.  Discussed with Dr. Nehemiah Massed who agrees.  Repeat echocardiogram in a few weeks. ?

## 2021-12-05 NOTE — Progress Notes (Signed)
PT Cancellation Note ? ?Patient Details ?Name: [name] ?MRN: [id number] ?DOB: 1960/11/02 ? ? ?Cancelled Treatment:    Reason Eval/Treat Not Completed: Other (comment).  Chart reviewed and attempted to see pt.  Pt requesting to not be disturbed as she just got her salad and is hungry.  She reports she may be done by 5:00 PM.  Pt advised that patient will be seen at later date and time as medically appropriate. ? ? ?Gwenlyn Saran, PT, DPT ?12/05/21, 2:58 PM ? ?

## 2021-12-05 NOTE — Progress Notes (Signed)
Occupational Therapy Treatment ?Patient Details ?Name: [name] ?MRN: [id number] ?DOB: [birth year] ?Today's Date: 12/05/2021 ? ? ?History of present illness ANNALIESE [name] is a 61 y.o. female with medical history significant for ESRD on HD TTS, DM, chronic diastolic heart failure, CAD, PVD, Chronic respiratory failure on home O2 at 2 L, who presents to the ED with a 7-10 day history of cough, chills, weakness, myalgia; admitted for management of PNA due to COVID-19.  Hospital course significant for RR call due to episode of decreased responsiveness; relieved with Narcan (after which opioids discontinued). ?  ?OT comments ? Chart reviewed, nurse cleared pt for participation in OT tx session. Pt is agreeable to tx session. Tx session targeted progressing ADL tasks and functional mobility. Pt is making progress in bed mobility, STS, amb,and standing tolerance all in prep for ADL task completion. Pt also demonstrates progress in ADL task completion. Pt is left at edge of bed in care of RN. Discharge recommendations remain appropriate. OT will continue to follow while admitted.   ? ?Recommendations for follow up therapy are one component of a multi-disciplinary discharge planning process, led by the attending physician.  Recommendations may be updated based on patient status, additional functional criteria and insurance authorization. ?   ?Follow Up Recommendations ? Skilled nursing-short term rehab (<3 hours/day)  ?  ?Assistance Recommended at Discharge Frequent or constant Supervision/Assistance  ?Patient can return home with the following ? A lot of help with walking and/or transfers;A lot of help with bathing/dressing/bathroom;Assist for transportation;Assistance with cooking/housework ?  ?Equipment Recommendations ? Other (comment) (per next venue of care)  ?  ?Recommendations for Other Services   ? ?  ?Precautions / Restrictions Precautions ?Precautions: Fall ?Precaution Comments: R chest perm-cath ?Restrictions ?Weight  Bearing Restrictions: No  ? ? ?  ? ?Mobility Bed Mobility ?Overal bed mobility: Needs Assistance ?Bed Mobility: Supine to Sit, Sit to Supine ?  ?  ?Supine to sit: Supervision, HOB elevated ?Sit to supine: Supervision, HOB elevated ?  ?  ?  ? ?Transfers ?Overall transfer level: Needs assistance ?Equipment used: Rolling walker (2 wheels) ?Transfers: Sit to/from Stand ?Sit to Stand: Min guard, From elevated surface ?  ?  ?  ?  ?  ?General transfer comment: with RW ?  ?  ?Balance Overall balance assessment: Needs assistance ?Sitting-balance support: Bilateral upper extremity supported, Feet supported ?Sitting balance-Leahy Scale: Good ?  ?  ?Standing balance support: Bilateral upper extremity supported ?Standing balance-Leahy Scale: Fair ?  ?  ?  ?  ?  ?  ?  ?  ?  ?  ?  ?  ?   ? ?ADL either performed or assessed with clinical judgement  ? ?ADL Overall ADL's : Needs assistance/impaired ?  ?  ?  ?  ?  ?  ?  ?  ?  ?  ?  ?  ?  ?  ?  ?  ?  ?  ?  ?General ADL Comments: UB/LB bathing with CGA-MIN A STS, peri care with CGA-MIN A with STS, grooming tasks seated at edge of bed with SET UP, UB dressing with MIN A; functional amb approx 5 feet with CGA, standing with RW for approx 5 minutes with close SUP ?  ? ?Extremity/Trunk Assessment   ?  ?  ?  ?  ?  ? ?Vision   ?  ?  ?Perception   ?  ?Praxis   ?  ? ?Cognition Arousal/Alertness: Awake/alert ?Behavior During Therapy: [facility] for  tasks assessed/performed ?Overall Cognitive Status: No family/caregiver present to determine baseline cognitive functioning ?  ?  ?  ?  ?  ?  ?  ?  ?  ?  ?  ?  ?  ?  ?  ?  ?  ?  ?  ?   ?Exercises   ? ?  ?Shoulder Instructions   ? ? ?  ?General Comments    ? ? ?Pertinent Vitals/ Pain       Pain Assessment ?Pain Assessment: No/denies pain ? ?Home Living   ?  ?  ?  ?  ?  ?  ?  ?  ?  ?  ?  ?  ?  ?  ?  ?  ?  ?  ? ?  ?Prior Functioning/Environment    ?  ?  ?  ?   ? ?Frequency ? Min 2X/week  ? ? ? ? ?  ?Progress Toward Goals ? ?OT Goals(current goals can now be  found in the care plan section) ? Progress towards OT goals: Progressing toward goals ? ?Acute Rehab OT Goals ?Patient Stated Goal: stand at edge of bed ?OT Goal Formulation: With patient ?Time For Goal Achievement: 12/19/21 ?Potential to Achieve Goals: Good  ?Plan Discharge plan remains appropriate;Frequency remains appropriate   ? ?Co-evaluation ? ? ?   ?  ?  ?  ?  ? ?  ?AM-PAC OT "6 Clicks" Daily Activity     ?Outcome Measure ? ? Help from another person eating meals?: A Little ?Help from another person taking care of personal grooming?: None ?Help from another person toileting, which includes using toliet, bedpan, or urinal?: A Little ?Help from another person bathing (including washing, rinsing, drying)?: A Little ?Help from another person to put on and taking off regular upper body clothing?: A Little ?Help from another person to put on and taking off regular lower body clothing?: A Lot ?6 Click Score: 18 ? ?  ?End of Session Equipment Utilized During Treatment: Oxygen ? ?OT Visit Diagnosis: Unsteadiness on feet (R26.81);Muscle weakness (generalized) (M62.81) ?  ?Activity Tolerance Patient tolerated treatment well ?  ?Patient Left in bed;with call bell/phone within reach;with bed alarm set;with nursing/sitter in room ?  ?Nurse Communication Mobility status ?  ? ?   ? ?Time: 1093-2355 ?OT Time Calculation (min): 26 min ? ?Charges: OT General Charges ?$OT Visit: 1 Visit ?OT Treatments ?$Self Care/Home Management : 8-22 mins ?$Therapeutic Activity: 8-22 mins ? ?Shanon Payor, OTD OTR/L  ?12/05/21, 4:12 PM  ?

## 2021-12-05 NOTE — Progress Notes (Signed)
?[facility]  ?ROUNDING NOTE  ? ?Subjective:  ? ?[name] is a 61 year old female with past medical history including diabetes, chronic diastolic heart failure, PVD, CAD, chronic respiratory failure with 2 L oxygen used at home, and end-stage renal disease on hemodialysis.  Patient presents to the emergency department with complaints of diarrhea, nausea, weakness and chills.  Patient has been admitted for under observation for Myalgia [M79.10] ?Pneumonia [J18.9] ?Community acquired pneumonia of left lower lobe of lung [J18.9] ?Bacterial pneumonia [J15.9] ? ?Patient is known to our practice and receives outpatient dialysis at [facility] on a MWF schedule, supervised by Dr Candiss Norse.  ? ?Update ?Patient resting quietly ?Alert and oriented ?States she feels better, stronger today ?Tolerating small meals ?Remains short of breath on exertion, supplemental oxygen fluctuates between 2-3L ? ? ?Objective:  ?Vital signs in last 24 hours:  ?Temp:  [97.7 ?F (36.5 ?C)-98.3 ?F (36.8 ?C)] 98.3 ?F (36.8 ?C) (03/16 0502) ?Pulse Rate:  [62-74] 62 (03/16 0502) ?Resp:  [7-24] 17 (03/16 0502) ?BP: (140-168)/(50-73) 140/50 (03/16 0502) ?SpO2:  [98 %-100 %] 100 % (03/16 0502) ?Weight:  [80.5 kg] 80.5 kg (03/15 1907) ? ?Weight change:  ?Filed Weights  ? 12/02/21 1530 12/02/21 1830 12/04/21 1907  ?Weight: 77.9 kg 77.3 kg 80.5 kg  ? ? ?Intake/Output: ?I/O last 3 completed shifts: ?In: 0  ?Out: 1000 [Other:1000] ?  ?Intake/Output this shift: ? No intake/output data recorded. ? ?Physical Exam: ?General: NAD  ?Head: Normocephalic, atraumatic. Moist oral mucosal membranes  ?Eyes: Anicteric  ?Lungs:  Basilar crackles, normal effort, 3 L [place]  ?Heart: Regular rate and rhythm  ?Abdomen:  Soft, nontender  ?Extremities: Trace peripheral edema  ?Neurologic: Nonfocal, moving all four extremities  ?Skin: No lesions  ?Access: Right PermCath  ? ? ?Basic Metabolic Panel: ?Recent Labs  ?Lab 11/29/21 ?1405 12/03/21 ?5462  ?NA 131* 130*  ?K 4.2  3.5  ?CL 93* 92*  ?CO2 27 29  ?GLUCOSE 78 161*  ?BUN 24* 12  ?CREATININE 6.10* 4.32*  ?CALCIUM 8.0* 8.2*  ?PHOS 6.6* 4.9*  ? ? ? ?Liver Function Tests: ?Recent Labs  ?Lab 11/29/21 ?1405 12/03/21 ?7035  ?ALBUMIN 2.8* 2.9*  ? ? ?No results for input(s): LIPASE, AMYLASE in the last 168 hours. ?No results for input(s): AMMONIA in the last 168 hours. ? ?CBC: ?Recent Labs  ?Lab 11/29/21 ?1405  ?WBC 4.4  ?HGB 12.3  ?HCT 39.1  ?MCV 88.3  ?PLT 282  ? ? ? ?Cardiac Enzymes: ?No results for input(s): CKTOTAL, CKMB, CKMBINDEX, TROPONINI in the last 168 hours. ? ?BNP: ?Invalid input(s): POCBNP ? ?CBG: ?Recent Labs  ?Lab 11/30/21 ?1627 12/01/21 ?0105 12/01/21 ?0902 12/01/21 ?1140 12/01/21 ?1600  ?GLUCAP 213* 184* 127* 120* 136*  ? ? ? ?Microbiology: ?Results for orders placed or performed during the hospital encounter of 11/27/21  ?Resp Panel by RT-PCR (Flu A&B, Covid) Nasopharyngeal Swab     Status: Abnormal  ? Collection Time: 11/27/21  3:54 AM  ? Specimen: Nasopharyngeal Swab; Nasopharyngeal(NP) swabs in vial transport medium  ?Result Value Ref Range Status  ? SARS Coronavirus 2 by RT PCR POSITIVE (A) NEGATIVE Final  ?  Comment: (NOTE) ?SARS-CoV-2 target nucleic acids are DETECTED. ? ?The SARS-CoV-2 RNA is generally detectable in upper respiratory ?specimens during the acute phase of infection. Positive results are ?indicative of the presence of the identified virus, but do not rule ?out bacterial infection or co-infection with other pathogens not ?detected by the test. Clinical correlation with patient history and ?other  diagnostic information is necessary to determine patient ?infection status. The expected result is Negative. ? ?Fact Sheet for Patients: ?EntrepreneurPulse.com.au ? ?Fact Sheet for Healthcare Providers: ?IncredibleEmployment.be ? ?This test is not yet approved or cleared by the [place] FDA and  ?has been authorized for detection and/or diagnosis of SARS-CoV-2 by ?FDA  under an Emergency Use Authorization (EUA).  This EUA will ?remain in effect (meaning this test can be used) for the duration of  ?the COVID-19 declaration under Section 564(b)(1) of the A ct, 21 ?U.S.C. section 360bbb-3(b)(1), unless the authorization is ?terminated or revoked sooner. ? ?  ? Influenza A by PCR NEGATIVE NEGATIVE Final  ? Influenza B by PCR NEGATIVE NEGATIVE Final  ?  Comment: (NOTE) ?The Xpert Xpress SARS-CoV-2/FLU/RSV plus assay is intended as an aid ?in the diagnosis of influenza from Nasopharyngeal swab specimens and ?should not be used as a sole basis for treatment. Nasal washings and ?aspirates are unacceptable for Xpert Xpress SARS-CoV-2/FLU/RSV ?testing. ? ?Fact Sheet for Patients: ?EntrepreneurPulse.com.au ? ?Fact Sheet for Healthcare Providers: ?IncredibleEmployment.be ? ?This test is not yet approved or cleared by the [place] FDA and ?has been authorized for detection and/or diagnosis of SARS-CoV-2 by ?FDA under an Emergency Use Authorization (EUA). This EUA will remain ?in effect (meaning this test can be used) for the duration of the ?COVID-19 declaration under Section 564(b)(1) of the Act, 21 U.S.C. ?section 360bbb-3(b)(1), unless the authorization is terminated or ?revoked. ? ?Performed at [facility], [place], ?[place] [address] ?  ?Blood culture (single)     Status: None  ? Collection Time: 11/27/21  5:04 AM  ? Specimen: BLOOD  ?Result Value Ref Range Status  ? Specimen Description BLOOD LEFT FA  Final  ? Special Requests   Final  ?  BOTTLES DRAWN AEROBIC AND ANAEROBIC Blood Culture adequate volume  ? Culture   Final  ?  NO GROWTH 5 DAYS ?Performed at [facility], [address], [place], [place] [address] ?  ? Report Status 12/02/2021 FINAL  Final  ?Expectorated Sputum Assessment w Gram Stain, Rflx to Resp Cult     Status: None  ? Collection Time: 11/27/21  6:57 AM  ? Specimen: Expectorated Sputum   ?Result Value Ref Range Status  ? Specimen Description EXPECTORATED SPUTUM  Final  ? Special Requests NONE  Final  ? Sputum evaluation   Final  ?  THIS SPECIMEN IS ACCEPTABLE FOR SPUTUM CULTURE ?Performed at [facility], [address], [place], [place] [address] ?  ? Report Status 11/27/2021 FINAL  Final  ?Culture, Respiratory w Gram Stain     Status: None  ? Collection Time: 11/27/21  6:57 AM  ?Result Value Ref Range Status  ? Specimen Description   Final  ?  EXPECTORATED SPUTUM ?Performed at [facility] [place], [address], [place], [place] [address] ?  ? Special Requests   Final  ?  NONE Reflexed from [id number] ?Performed at [facility], [address], [place], [place] [address] ?  ? Gram Stain   Final  ?  MODERATE SQUAMOUS EPITHELIAL CELLS PRESENT ?FEW WBC PRESENT,BOTH PMN AND MONONUCLEAR ?FEW BUDDING YEAST SEEN ?FEW GRAM POSITIVE COCCI ?FEW GRAM NEGATIVE RODS ?Performed at [facility], [place] [address], [place], [place] [address] ?  ? Culture   Final  ?  ABUNDANT ESCHERICHIA COLI ?Confirmed Extended Spectrum Beta-Lactamase Producer (ESBL).  In bloodstream infections from ESBL organisms, carbapenems are preferred over piperacillin/tazobactam. They are  shown to have a lower risk of mortality. ?  ? Report Status 11/30/2021 FINAL  Final  ? Organism ID, Bacteria ESCHERICHIA COLI  Final  ?    Susceptibility  ? Escherichia coli - MIC*  ?  AMPICILLIN >=32 RESISTANT Resistant   ?  CEFAZOLIN >=64 RESISTANT Resistant   ?  CEFEPIME >=32 RESISTANT Resistant   ?  CEFTAZIDIME RESISTANT Resistant   ?  CEFTRIAXONE >=64 RESISTANT Resistant   ?  CIPROFLOXACIN >=4 RESISTANT Resistant   ?  GENTAMICIN <=1 SENSITIVE Sensitive   ?  IMIPENEM <=0.25 SENSITIVE Sensitive   ?  TRIMETH/SULFA <=20 SENSITIVE Sensitive   ?  AMPICILLIN/SULBACTAM >=32 RESISTANT Resistant   ?  PIP/TAZO 64 INTERMEDIATE Intermediate   ?  * ABUNDANT ESCHERICHIA COLI  ? ? ?Coagulation Studies: ?No results for input(s): LABPROT, INR  in the last 72 hours. ? ?Urinalysis: ?No results for input(s): COLORURINE, LABSPEC, [place], GLUCOSEU, HGBUR, BILIRUBINUR, KETONESUR, PROTEINUR, UROBILINOGEN, NITRITE, LEUKOCYTESUR in the last 72 hours. ? ?Inval

## 2021-12-05 NOTE — Progress Notes (Signed)
? ? ? ?PULMONOLOGY ? ? ? ? ? ? ? ? ?Date: 12/05/2021,   ?MRN# [id number] [name] 07/03/1961 ? ? ?  ?AdmissionWeight: 74.5 kg                 ?CurrentWeight: 80.5 kg ? ? ?Referring physician: Dr Lavella Lemons ? ? ?CHIEF COMPLAINT:  ? ?Pneumonia post COVID19 infection ? ? ?HISTORY OF PRESENT ILLNESS  ? ?61 yo F with hx of ESRD on HD, DM, HFpEF, CAD, PVD, CRF on O2 at 2l/min [place] presenting with sings of flu like illness with cough chills weakness mayaglia.  Patient has been with COVID19+ over past 1wk she had NVD 3d prior to admission.  I independently reivewed her CXR which appears to have both airspace and interstitial opacification with cardiomegaly. PCCM consultation for further evaluation and management.  ? ?12/04/21- Reviewed CT chest independently, with bilateral pleural effusions, signs of interstitial lung disease with traction bronchiectasis and cardiomegaly with pulmonary edema and associated subcutaneous edema and lymphadenopathy.  CRP elevated. Plan to continue steroids, HD for fluid balanace. ?TTE and BNP in process ? ?12/05/21- patient with secondary ESBL pneumonia s/p Meropenem with pharmacy consult.  COVID19 viral LRTI ongoing therapy, weaned to 2L/min [place]. Mentation improved.  Severe hearing loss bilaterally chronically had to communicate in writing. Moderate pericardial effusion noted on TTE.  ? ?PAST MEDICAL HISTORY  ? ?Past Medical History:  ?Diagnosis Date  ? Anemia   ? Anginal pain ([place])   ? Anxiety   ? Arthritis   ? Asthma   ? Broken wrist   ? Bronchitis   ? chronic diastolic CHF 6/73/4193  ? Chronic kidney disease   ? COPD (chronic obstructive pulmonary disease) ([place])   ? Coronary artery disease   ? a. cath 2013: stenting to RCA (report not available); b. cath 2014: LM nl, pLAD 40%, mLAD nl, ost LCx 40%, mid LCx nl, pRCA 30% @ site of prior stent, mRCA 50%  ? Depression   ? Diabetes mellitus ([place])   ? Diabetes mellitus without complication ([place])   ? Diabetic neuropathy ([place])   ? dialysis 2006  ?  Diverticulosis   ? Dizziness   ? Dyspnea   ? Elevated lipids   ? Environmental and seasonal allergies   ? ESRD (end stage renal disease) on dialysis [facility])   ? M-W-F  ? Gastroparesis   ? GERD (gastroesophageal reflux disease)   ? Headache   ? History of anemia due to chronic kidney disease   ? History of hiatal hernia   ? HOH (hard of hearing)   ? Hx of pancreatitis 2015  ? Hypertension   ? Lower extremity edema   ? Mitral regurgitation   ? a. echo 10/2013: EF 62%, noWMA, mildly dilated LA, mild to mod MR/TR, GR1DD  ? Myocardial infarction [facility])   ? Orthopnea   ? Parathyroid abnormality ([place])   ? Peripheral arterial disease ([place])   ? Pneumonia   ? Renal cancer ([place])   ? Renal insufficiency   ? Pt is on dialysis on M,W + F.  ? Wheezing   ? ? ? ?SURGICAL HISTORY  ? ?Past Surgical History:  ?Procedure Laterality Date  ? A/V SHUNTOGRAM Left 01/20/2018  ? Procedure: A/V SHUNTOGRAM;  Surgeon: Algernon Huxley, MD;  Location: [place] [facility];  Service: Cardiovascular;  Laterality: Left;  ? ABDOMINAL HYSTERECTOMY  1992  ? AMPUTATION TOE Left 10/02/2017  ? Procedure: AMPUTATION TOE-LEFT GREAT TOE;  Surgeon: Albertine Patricia, DPM;  Location: [facility] ORS;  Service: Podiatry;  Laterality: Left;  ? APPENDECTOMY    ? APPLICATION OF WOUND VAC N/A 11/25/2019  ? Procedure: APPLICATION OF WOUND VAC;  Surgeon: Katha Cabal, MD;  Location: [facility] OR[facility];  Service: Vascular;  Laterality: N/A;  ? ARTERY BIOPSY Right 10/11/2018  ? Procedure: BIOPSY TEMPORAL ARTERY;  Surgeon: Vickie Epley, MD;  Location: [facility];  Service: General;  Laterality: Right;  ? CARDIAC CATHETERIZATION Left 07/26/2015  ? Procedure: Left Heart Cath and Coronary Angiography;  Surgeon: Dionisio David, MD;  Location: [place] [facility];  Service: Cardiovascular;  Laterality: Left;  ? CATARACT EXTRACTION W/ INTRAOCULAR LENS IMPLANT Right   ? CATARACT EXTRACTION W/PHACO Left 03/10/2017  ? Procedure: CATARACT EXTRACTION PHACO AND INTRAOCULAR LENS PLACEMENT (IOC);  Surgeon:  Birder Robson, MD;  Location: [facility] OR[facility];  Service: Ophthalmology;  Laterality: Left;  [place] 00:51.9 ?AP% 14.2 ?CDE 7.39 ?fluid pack lot # [id number]  ? CENTRAL LINE INSERTION Right 11/11/2019  ? Procedure: CENTRAL LINE INSERTION;  Surgeon: Katha Cabal, MD;  Location: [facility];  Service: Vascular;  Laterality: Right;  ? CENTRAL LINE INSERTION  11/25/2019  ? Procedure: CENTRAL LINE INSERTION;  Surgeon: Delana Meyer Dolores Lory, MD;  Location: [facility];  Service: Vascular;;  ? CHOLECYSTECTOMY    ? COLONOSCOPY WITH PROPOFOL N/A 08/12/2016  ? Procedure: COLONOSCOPY WITH PROPOFOL;  Surgeon: Lollie Sails, MD;  Location: [facility] ENDOSCOPY;  Service: Endoscopy;  Laterality: N/A;  ? DIALYSIS FISTULA CREATION Left   ? upper arm  ? dialysis grafts    ? DIALYSIS/PERMA CATHETER INSERTION N/A 11/14/2019  ? Procedure: DIALYSIS/PERMA CATHETER INSERTION;  Surgeon: Algernon Huxley, MD;  Location: [place] [facility];  Service: Cardiovascular;  Laterality: N/A;  ? DIALYSIS/PERMA CATHETER INSERTION N/A 02/03/2020  ? Procedure: DIALYSIS/PERMA CATHETER INSERTION;  Surgeon: Katha Cabal, MD;  Location: [place] [facility];  Service: Cardiovascular;  Laterality: N/A;  ? DIALYSIS/PERMA CATHETER INSERTION N/A 06/19/2020  ? Procedure: DIALYSIS/PERMA CATHETER INSERTION;  Surgeon: Katha Cabal, MD;  Location: [place] [facility];  Service: Cardiovascular;  Laterality: N/A;  ? DIALYSIS/PERMA CATHETER INSERTION N/A 05/07/2021  ? Procedure: DIALYSIS/PERMA CATHETER INSERTION;  Surgeon: Katha Cabal, MD;  Location: [place] [facility];  Service: Cardiovascular;  Laterality: N/A;  Patient needs general anethesia  ? DIALYSIS/PERMA CATHETER REMOVAL N/A 05/25/2020  ? Procedure: DIALYSIS/PERMA CATHETER REMOVAL;  Surgeon: Katha Cabal, MD;  Location: [place] [facility];  Service: Cardiovascular;  Laterality: N/A;  ? ESOPHAGOGASTRODUODENOSCOPY N/A 03/08/2015  ? Procedure: ESOPHAGOGASTRODUODENOSCOPY (EGD);  Surgeon: Manya Silvas, MD;   Location: [facility] ENDOSCOPY;  Service: Endoscopy;  Laterality: N/A;  ? ESOPHAGOGASTRODUODENOSCOPY (EGD) WITH PROPOFOL N/A 03/18/2016  ? Procedure: ESOPHAGOGASTRODUODENOSCOPY (EGD) WITH PROPOFOL;  Surgeon: [name], MD;  Location: [facility] ENDOSCOPY;  Service: Endoscopy;  Laterality: N/A;  ? EYE SURGERY Right 2018  ? FECAL TRANSPLANT N/A 08/23/2015  ? Procedure: FECAL TRANSPLANT;  Surgeon: Manya Silvas, MD;  Location: [facility] ENDOSCOPY;  Service: Endoscopy;  Laterality: N/A;  ? HAND SURGERY Bilateral   ? HEMATOMA EVACUATION Left 11/25/2019  ? Procedure: EVACUATION HEMATOMA;  Surgeon: Katha Cabal, MD;  Location: [facility];  Service: Vascular;  Laterality: Left;  ? I & D EXTREMITY Left 11/25/2019  ? Procedure: IRRIGATION AND DEBRIDEMENT EXTREMITY;  Surgeon: Katha Cabal, MD;  Location: [facility];  Service: Vascular;  Laterality: Left;  ? IR FLUORO GUIDE CV LINE RIGHT  04/06/2020  ? IR INJECT/THERA/INC NEEDLE/CATH/PLC EPI/CERV/THOR W/IMG  08/13/2020  ? IR RADIOLOGIST EVAL & MGMT  07/28/2019  ? IR RADIOLOGIST EVAL & MGMT  08/11/2019  ? LIGATION OF ARTERIOVENOUS  FISTULA Left 11/11/2019  ? LIGATION OF ARTERIOVENOUS  FISTULA Left 11/11/2019  ? Procedure: LIGATION OF ARTERIOVENOUS  FISTULA;  Surgeon: Katha Cabal, MD;  Location: [facility];  Service: Vascular;  Laterality: Left;  ? LIGATIONS OF HERO GRAFT Right 06/13/2020  ? Procedure: LIGATION / REMOVAL OF RIGHT HERO GRAFT;  Surgeon: Katha Cabal, MD;  Location: [facility];  Service: Vascular;  Laterality: Right;  ? PERIPHERAL VASCULAR CATHETERIZATION N/A 12/20/2015  ? Procedure: Thrombectomy of dialysis access versus permcath placement;  Surgeon: Algernon Huxley, MD;  Location: [place] [facility];  Service: Cardiovascular;  Laterality: N/A;  ? PERIPHERAL VASCULAR CATHETERIZATION N/A 12/20/2015  ? Procedure: A/V Shunt Intervention;  Surgeon: Algernon Huxley, MD;  Location: [place] [facility];  Service: Cardiovascular;  Laterality: N/A;  ? PERIPHERAL VASCULAR  CATHETERIZATION N/A 12/20/2015  ? Procedure: A/V Shuntogram/Fistulagram;  Surgeon: Algernon Huxley, MD;  Location: [place] [facility];  Service: Cardiovascular;  Laterality: N/A;  ? PERIPHERAL VASCULAR CATHETERIZATION N/A 4/

## 2021-12-06 LAB — ANA COMPREHENSIVE PANEL
Anti JO-1: <0.2 AI (ref 0.0–0.9)
Centromere Ab Screen: <0.2 AI (ref 0.0–0.9)
Chromatin Ab SerPl-aCnc: <0.2 AI (ref 0.0–0.9)
ENA SM Ab Ser-aCnc: <0.2 AI (ref 0.0–0.9)
Ribonucleic Protein: 0.2 AI (ref 0.0–0.9)
SSA (Ro) (ENA) Antibody, IgG: <0.2 AI (ref 0.0–0.9)
SSB (La) (ENA) Antibody, IgG: <0.2 AI (ref 0.0–0.9)
Scleroderma (Scl-70) (ENA) Antibody, IgG: <0.2 AI (ref 0.0–0.9)
ds DNA Ab: <1 IU/mL (ref 0–9)

## 2021-12-06 LAB — ANCA TITERS
Atypical P-ANCA titer: <1:20 {titer}
C-ANCA: <1:20 {titer}
P-ANCA: <1:20 {titer}

## 2021-12-06 MED ORDER — ACETAMINOPHEN 325 MG PO TABS: 650.0000 mg | ORAL_TABLET | Freq: Four times a day (QID) | ORAL | Status: DC | PRN

## 2021-12-06 MED ORDER — BENZONATATE 200 MG PO CAPS
200.0000 mg | ORAL_CAPSULE | Freq: Three times a day (TID) | ORAL | 0 refills | Status: DC
Start: 2021-12-06 — End: 2021-12-06

## 2021-12-06 MED ORDER — BENZONATATE 200 MG PO CAPS: 200.0000 mg | ORAL_CAPSULE | Freq: Three times a day (TID) | ORAL | 0 refills | Status: AC

## 2021-12-06 MED ORDER — GUAIFENESIN-DM 100-10 MG/5ML PO SYRP: 10.0000 mL | ORAL_SOLUTION | ORAL | 0 refills | Status: DC | PRN

## 2021-12-06 MED ORDER — HYDRALAZINE HCL 100 MG PO TABS: 50.0000 mg | ORAL_TABLET | Freq: Two times a day (BID) | ORAL | 1 refills | Status: DC

## 2021-12-06 MED ORDER — HYDRALAZINE HCL 100 MG PO TABS: 50.0000 mg | ORAL_TABLET | Freq: Two times a day (BID) | ORAL | Status: DC

## 2021-12-06 MED ORDER — HEPARIN SODIUM (PORCINE) 1000 UNIT/ML IJ SOLN
INTRAMUSCULAR | Status: AC
  Administered 2021-12-06: 4200 [IU]
  Filled 2021-12-06: qty 10

## 2021-12-06 NOTE — Discharge Summary (Signed)
?[facility] ? ?Physician Discharge Summary  ? ?Patient ID: ?[name] ?MRN: [id number] ?DOB/AGE: 1960/12/31 61 y.o. ? ?Admit date: 11/27/2021 ?Discharge date:   12/06/2021 ? ? ?PCP: Denton Lank, MD ? ?DISCHARGE DIAGNOSES:  ?Principal Problem: ?  Bacterial pneumonia ?Active Problems: ?  COVID-19 virus infection ?  Chronic respiratory failure with hypoxia (HCC) ?  End-stage renal disease on hemodialysis ([place]) ?  Unresponsiveness ?  Hyponatremia ?  HTN (hypertension) ?  Diabetes mellitus, type II ([place]) ?  Chronic diastolic CHF (congestive heart failure) ([place]) ?  Hearing impairment ?  Coronary artery disease involving native coronary artery of native heart without angina pectoris ?  Physical debility ?  Pericardial effusion ? ? ?RECOMMENDATIONS FOR OUTPATIENT FOLLOW UP: ?Please check CBC and basic metabolic panel in a few days. ?Patient to continue with hemodialysis as per her usual schedule on Monday Wednesday Friday ?Check CBGs once daily ?Needs echocardiogram with Dr. Nehemiah Massed in 3 to 4 weeks ?Needs referral to ENT for hearing impairment and audiological testing ? ? ? ?Home Health: SNF ?Equipment/Devices: None ? ?CODE STATUS: Full code ? ?DISCHARGE CONDITION: fair ? ?Diet recommendation: Modified carbohydrate ? ?INITIAL HISTORY: ?60 year old female with medical history significant for ESRD on TTS HD, type II DM, chronic diastolic CHF, CAD, PAD, chronic respiratory failure on home oxygen 2 L/min, COPD, anemia, HLD, GERD, hard of hearing, presented to the ED with 7 to 10 days of cough, chills, weakness and myalgia.  Reported testing positive for COVID 19 at the dialysis center more than a week ago.  Denies dyspnea or chest pain.  Developed self-limiting diarrhea 3 days PTA without nausea, vomiting or abdominal pain.  Admitted for bacterial pneumonia superimposed on recent COVID-19 infection.  Nephrology consulted for HD needs.  On 3/9, patient obtunded, rapid response initiated, improved after a dose of Narcan,  opioids discontinued.  Completed 5 days course of IV antibiotics for pneumonia.  Although clinically improved, persistent bothersome cough, chest x-ray shows worsening infiltrates, ID consulted (no interventions recommended, pulmonology consulted and getting CT chest to further evaluate.  Patient remains stable for the most part. ? ?Consultants: Nephrology.  Pulmonology ?  ?Procedures: Hemodialysis ?  ?  ?HOSPITAL COURSE:  ? ?* Bacterial pneumonia ?Patient with productive cough and abnormal chest x-ray findings. Procalcitonin elevated but unclear significance in the context of ESRD patient. ?Completed total 5 days of IV antibiotics including ceftriaxone and azithromycin ?Blood cultures negative to date.  Sputum culture showed E. coli, MDR except to gentamicin, imipenem and Bactrim. ?As per communication with pharmacy, patient has clinically improved despite current antibiotics not covering for E. coli and hence completed ceftriaxone.  Seen by infectious disease who recommended continuing observation off of antibiotics as they feel that this organism is reflective of colonization rather than a true infection. ?Patient seen by pulmonology.  CT of the chest was done which showed Interlobular septal thickening, scattered ground-glass airspace disease, and small bilateral effusions consistent with pulmonary edema.  ?Patient has ongoing symptoms likely due to lingering COVID as well as bacterial infection.  She was told that the cough or take a long time to get better.  No further recommendations from pulmonology. ?Patient started on gabapentin nightly for her cough.  Remains on Tessalon as well.  Continue with incentive spirometry.   ?Patient has improved.  Cough is better.  Discussed with pulmonology.  Okay to stop meropenem.  Okay for discharge today. ? ?COVID-19 virus infection ?Per patient report, tested positive for COVID-19 at the outpatient dialysis  center more than a week ago. ?She tested positive in the  hospital.  Respiratory status is stable except for her ongoing cough.  Findings noted on CT chest could be from her recent infection.  CRP is 2.6. ?Patient was not given any steroids or antiviral treatment at admission. ?Respiratory status is stable. ?Patient tested positive on 11/27/2021.  She will complete 10 days of isolation on 12/07/2021.  She can come off of isolation on 12/08/2021 ? ?Chronic respiratory failure with hypoxia (HCC) ?Respiratory status is stable.  2-3 L/min O2 requirement at baseline. ? ? ?Unresponsiveness ?Acute toxic metabolic encephalopathy ? ?On 3/9, RN alerted that patient was barely arousable, respiratory rate of 10. ?Ordered Narcan, ABG and advised to activate rapid response team. ?Improved after dose of Narcan. ?Likely related to Vicodin, Tussionex complicating dialysis patient. ?Discontinued all opioids and minimize use of same. ?Encephalopathy resolved. ? ?End-stage renal disease on hemodialysis ([place]) ?Patient was seen by nephrology.  Dialyzed on her Monday Wednesday Friday schedule.   ? ?Hyponatremia ?Labs are stable. ? ?HTN (hypertension) ?Blood pressure is reasonably well controlled.  She is noted to be on amlodipine and hydralazine.   ?We will avoid aggressive management as she is on dialysis. ? ?Diabetes mellitus, type II ([place]) ?Had some hypoglycemic episodes and CBGs monitored closely, no further episodes. ?Not on insulins.   ?A1c well controlled at 6.2 on 11/15/2021 ?CBGs were discontinued. ?Monitor CBGs once daily at skilled nursing facility. ? ?Chronic diastolic CHF (congestive heart failure) ([place]) ?Compensated. ?Volume management via HD.   ? ?Hearing impairment ?Present ever since diagnosis of COVID.  Denies any ear pain.  Will need to be seen by ENT in the outpatient setting.  Will need audiological testing. ? ?Pericardial effusion ?Moderate pericardial effusion noted on echocardiogram done on 12/04/2021.  Previous echocardiograms did not show similar findings.  No evidence for  tamponade.  Can likely be monitored in the outpatient setting.  Discussed with Dr. Nehemiah Massed who agrees.  Repeat echocardiogram in a few weeks. ? ?Physical debility ?Patient noted to be profoundly weak.  No obvious focal neurological deficits noted.  This is all likely effects of recent infection along with COVID.  Seen by physical therapy.  Skilled nursing facility is recommended. ? ?Coronary artery disease involving native coronary artery of native heart without angina pectoris ?Continue aspirin and atorvastatin with nitroglycerin as needed chest pain.  Cardiac status is stable. ?Echocardiogram showed EF to be 60 to 65% without any regional wall motion abnormalities.  Moderate pericardial effusion was noted.  See below. ? ? ? ?Obesity ?Estimated body mass index is 32.46 kg/m? as calculated from the following: ?  Height as of this encounter: '5\' 2"'$  (8.676 m). ?  Weight as of this encounter: 80.5 kg. ? ?Patient is stable.  Okay for discharge to SNF today. ? ? ?PERTINENT LABS: ? ?The results of significant diagnostics from this hospitalization (including imaging, microbiology, ancillary and laboratory) are listed below for reference.   ? ?Microbiology: ?Recent Results (from the past 240 hour(s))  ?Resp Panel by RT-PCR (Flu A&B, Covid) Nasopharyngeal Swab     Status: Abnormal  ? Collection Time: 11/27/21  3:54 AM  ? Specimen: Nasopharyngeal Swab; Nasopharyngeal(NP) swabs in vial transport medium  ?Result Value Ref Range Status  ? SARS Coronavirus 2 by RT PCR POSITIVE (A) NEGATIVE Final  ?  Comment: (NOTE) ?SARS-CoV-2 target nucleic acids are DETECTED. ? ?The SARS-CoV-2 RNA is generally detectable in upper respiratory ?specimens during the acute phase of infection. Positive results are ?  indicative of the presence of the identified virus, but do not rule ?out bacterial infection or co-infection with other pathogens not ?detected by the test. Clinical correlation with patient history and ?other diagnostic information is  necessary to determine patient ?infection status. The expected result is Negative. ? ?Fact Sheet for Patients: ?EntrepreneurPulse.com.au ? ?Fact Sheet for Healthcare Providers: ?https://www.fda.go

## 2021-12-06 NOTE — Care Management Important Message (Signed)
Important Message ? ?Patient Details  ?Name: [name] ?MRN: [id number] ?Date of Birth: [birth year] ? ? ?Medicare Important Message Given:  Yes ? ?Reviewed Medicare IM with Tanayah Squitieri, son, at [phone]. Copy of Medicare IM sent securely to email address provided: [email or web address] ? ? ?Dannette Barbara ?12/06/2021, 10:05 AM ?

## 2021-12-06 NOTE — TOC Transition Note (Signed)
Transition of Care (TOC) - CM/SW Discharge Note ? ? ?Patient Details  ?Name: [name] ?MRN: [id number] ?Date of Birth: 27-Jul-1961 ? ?Transition of Care (TOC) CM/SW Contact:  ?Candie Chroman, LCSW ?Phone Number: ?12/06/2021, 4:42 PM ? ? ?Clinical Narrative:   Patient has orders to discharge to [facility] today. RN will call report and set up EMS transport when she gets back from HD. No further concerns. CSW signing off. ? ?Final next level of care: [place] ?Barriers to Discharge: Barriers Resolved ? ? ?Patient Goals and CMS Choice ?  ?  ?Choice offered to / list presented to : Patient, Adult Children ? ?Discharge Placement ?  ?           ?Patient chooses bed at: [facility] [place] ?Patient to be transferred to facility by: EMS ?Name of family member notified: Catalina Salasar ?Patient and family notified of of transfer: 12/06/21 ? ?Discharge Plan and Services ?  ?  ?           ?  ?  ?  ?  ?  ?  ?  ?  ?  ?  ? ?Social Determinants of Health (SDOH) Interventions ?  ? ? ?Readmission Risk Interventions ?Readmission Risk Prevention Plan 05/03/2021 12/11/2020 09/21/2020  ?Transportation Screening Complete Complete Complete  ?Medication Review Press photographer) Complete Complete Complete  ?PCP or Specialist appointment within 3-5 days of discharge - Complete Complete  ?[place] or Home Care Consult - Complete Complete  ?SW Recovery Care/Counseling Consult - Complete Complete  ?Palliative Care Screening Complete Not Applicable Complete  ?Skilled Nursing Facility - Not Complete Complete  ?SNF Comments - working on placement -  ?Some recent data might be hidden  ? ? ? ? ? ?

## 2021-12-06 NOTE — Progress Notes (Signed)
Patient medically cleared with discharge orders placed by MD.  EMS has been called for patient transport to [facility].  AVS and facesheet have been printed and placed in the patient's binder for EMS transport to facility. PIV was removed prior to discharge with patient awaiting arrival of EMS for transport. ?

## 2021-12-06 NOTE — Progress Notes (Signed)
Hemodialysis Post Treatment Note: ? ?Tx date:12/06/2021 ?Tx time:2.5 hours ?Access:right CVC ?UF Removed: 1L ? ?Note: HD treatment completed, tolerated well. No complications noted. Pt is asymptomatic, No complaints. ? ? ? ? ? ? ? ? ?  ?

## 2021-12-06 NOTE — Progress Notes (Signed)
? ? ? ?PULMONOLOGY ? ? ? ? ? ? ? ? ?Date: 12/06/2021,   ?MRN# [id number] [name] May 10, 1961 ? ? ?  ?AdmissionWeight: 74.5 kg                 ?CurrentWeight: 80.5 kg ? ? ?Referring physician: Dr Lavella Lemons ? ? ?CHIEF COMPLAINT:  ? ?Pneumonia post COVID19 infection ? ? ?HISTORY OF PRESENT ILLNESS  ? ?61 yo F with hx of ESRD on HD, DM, HFpEF, CAD, PVD, CRF on O2 at 2l/min [place] presenting with sings of flu like illness with cough chills weakness mayaglia.  Patient has been with COVID19+ over past 1wk she had NVD 3d prior to admission.  I independently reivewed her CXR which appears to have both airspace and interstitial opacification with cardiomegaly. PCCM consultation for further evaluation and management.  ? ?12/04/21- Reviewed CT chest independently, with bilateral pleural effusions, signs of interstitial lung disease with traction bronchiectasis and cardiomegaly with pulmonary edema and associated subcutaneous edema and lymphadenopathy.  CRP elevated. Plan to continue steroids, HD for fluid balanace. ?TTE and BNP in process ? ?12/05/21- patient with secondary ESBL pneumonia s/p Meropenem with pharmacy consult.  COVID19 viral LRTI ongoing therapy, weaned to 2L/min [place]. Mentation improved.  Severe hearing loss bilaterally chronically had to communicate in writing. Moderate pericardial effusion noted on TTE.  ? ?12/06/21- patient is improved and there is plan for dc home with outpatient follow up. Meropenem has been dcd due to colonization of ESBL per ID specialist. Appreciate collaboration.  ? ?PAST MEDICAL HISTORY  ? ?Past Medical History:  ?Diagnosis Date  ? Anemia   ? Anginal pain ([place])   ? Anxiety   ? Arthritis   ? Asthma   ? Broken wrist   ? Bronchitis   ? chronic diastolic CHF 11/11/2540  ? Chronic kidney disease   ? COPD (chronic obstructive pulmonary disease) ([place])   ? Coronary artery disease   ? a. cath 2013: stenting to RCA (report not available); b. cath 2014: LM nl, pLAD 40%, mLAD nl, ost LCx 40%, mid LCx nl,  pRCA 30% @ site of prior stent, mRCA 50%  ? Depression   ? Diabetes mellitus ([place])   ? Diabetes mellitus without complication ([place])   ? Diabetic neuropathy ([place])   ? dialysis 2006  ? Diverticulosis   ? Dizziness   ? Dyspnea   ? Elevated lipids   ? Environmental and seasonal allergies   ? ESRD (end stage renal disease) on dialysis [facility])   ? M-W-F  ? Gastroparesis   ? GERD (gastroesophageal reflux disease)   ? Headache   ? History of anemia due to chronic kidney disease   ? History of hiatal hernia   ? HOH (hard of hearing)   ? Hx of pancreatitis 2015  ? Hypertension   ? Lower extremity edema   ? Mitral regurgitation   ? a. echo 10/2013: EF 62%, noWMA, mildly dilated LA, mild to mod MR/TR, GR1DD  ? Myocardial infarction [facility])   ? Orthopnea   ? Parathyroid abnormality ([place])   ? Peripheral arterial disease ([place])   ? Pneumonia   ? Renal cancer ([place])   ? Renal insufficiency   ? Pt is on dialysis on M,W + F.  ? Wheezing   ? ? ? ?SURGICAL HISTORY  ? ?Past Surgical History:  ?Procedure Laterality Date  ? A/V SHUNTOGRAM Left 01/20/2018  ? Procedure: A/V SHUNTOGRAM;  Surgeon: Algernon Huxley, MD;  Location: [facility] INVASIVE CV  LAB;  Service: Cardiovascular;  Laterality: Left;  ? ABDOMINAL HYSTERECTOMY  1992  ? AMPUTATION TOE Left 10/02/2017  ? Procedure: AMPUTATION TOE-LEFT GREAT TOE;  Surgeon: Albertine Patricia, DPM;  Location: [facility] ORS;  Service: Podiatry;  Laterality: Left;  ? APPENDECTOMY    ? APPLICATION OF WOUND VAC N/A 11/25/2019  ? Procedure: APPLICATION OF WOUND VAC;  Surgeon: Katha Cabal, MD;  Location: [facility] ORS;  Service: Vascular;  Laterality: N/A;  ? ARTERY BIOPSY Right 10/11/2018  ? Procedure: BIOPSY TEMPORAL ARTERY;  Surgeon: Vickie Epley, MD;  Location: [facility] ORS;  Service: General;  Laterality: Right;  ? CARDIAC CATHETERIZATION Left 07/26/2015  ? Procedure: Left Heart Cath and Coronary Angiography;  Surgeon: Dionisio David, MD;  Location: [place] CV LAB;  Service: Cardiovascular;  Laterality: Left;  ?  CATARACT EXTRACTION W/ INTRAOCULAR LENS IMPLANT Right   ? CATARACT EXTRACTION W/PHACO Left 03/10/2017  ? Procedure: CATARACT EXTRACTION PHACO AND INTRAOCULAR LENS PLACEMENT (IOC);  Surgeon: Birder Robson, MD;  Location: [facility] ORS;  Service: Ophthalmology;  Laterality: Left;  [place] 00:51.9 ?AP% 14.2 ?CDE 7.39 ?fluid pack lot # [id number]  ? CENTRAL LINE INSERTION Right 11/11/2019  ? Procedure: CENTRAL LINE INSERTION;  Surgeon: Katha Cabal, MD;  Location: [facility] ORS;  Service: Vascular;  Laterality: Right;  ? CENTRAL LINE INSERTION  11/25/2019  ? Procedure: CENTRAL LINE INSERTION;  Surgeon: Delana Meyer Dolores Lory, MD;  Location: [facility] ORS;  Service: Vascular;;  ? CHOLECYSTECTOMY    ? COLONOSCOPY WITH PROPOFOL N/A 08/12/2016  ? Procedure: COLONOSCOPY WITH PROPOFOL;  Surgeon: Lollie Sails, MD;  Location: [facility] ENDOSCOPY;  Service: Endoscopy;  Laterality: N/A;  ? DIALYSIS FISTULA CREATION Left   ? upper arm  ? dialysis grafts    ? DIALYSIS/PERMA CATHETER INSERTION N/A 11/14/2019  ? Procedure: DIALYSIS/PERMA CATHETER INSERTION;  Surgeon: Algernon Huxley, MD;  Location: [place] CV LAB;  Service: Cardiovascular;  Laterality: N/A;  ? DIALYSIS/PERMA CATHETER INSERTION N/A 02/03/2020  ? Procedure: DIALYSIS/PERMA CATHETER INSERTION;  Surgeon: Katha Cabal, MD;  Location: [place] CV LAB;  Service: Cardiovascular;  Laterality: N/A;  ? DIALYSIS/PERMA CATHETER INSERTION N/A 06/19/2020  ? Procedure: DIALYSIS/PERMA CATHETER INSERTION;  Surgeon: Katha Cabal, MD;  Location: [place] CV LAB;  Service: Cardiovascular;  Laterality: N/A;  ? DIALYSIS/PERMA CATHETER INSERTION N/A 05/07/2021  ? Procedure: DIALYSIS/PERMA CATHETER INSERTION;  Surgeon: Katha Cabal, MD;  Location: [place] CV LAB;  Service: Cardiovascular;  Laterality: N/A;  Patient needs general anethesia  ? DIALYSIS/PERMA CATHETER REMOVAL N/A 05/25/2020  ? Procedure: DIALYSIS/PERMA CATHETER REMOVAL;  Surgeon: Katha Cabal, MD;  Location:  [place] CV LAB;  Service: Cardiovascular;  Laterality: N/A;  ? ESOPHAGOGASTRODUODENOSCOPY N/A 03/08/2015  ? Procedure: ESOPHAGOGASTRODUODENOSCOPY (EGD);  Surgeon: Manya Silvas, MD;  Location: [facility] ENDOSCOPY;  Service: Endoscopy;  Laterality: N/A;  ? ESOPHAGOGASTRODUODENOSCOPY (EGD) WITH PROPOFOL N/A 03/18/2016  ? Procedure: ESOPHAGOGASTRODUODENOSCOPY (EGD) WITH PROPOFOL;  Surgeon: [name], MD;  Location: [facility] ENDOSCOPY;  Service: Endoscopy;  Laterality: N/A;  ? EYE SURGERY Right 2018  ? FECAL TRANSPLANT N/A 08/23/2015  ? Procedure: FECAL TRANSPLANT;  Surgeon: Manya Silvas, MD;  Location: [facility] ENDOSCOPY;  Service: Endoscopy;  Laterality: N/A;  ? HAND SURGERY Bilateral   ? HEMATOMA EVACUATION Left 11/25/2019  ? Procedure: EVACUATION HEMATOMA;  Surgeon: Katha Cabal, MD;  Location: [facility] ORS;  Service: Vascular;  Laterality: Left;  ? I & D EXTREMITY Left 11/25/2019  ? Procedure: IRRIGATION AND DEBRIDEMENT EXTREMITY;  Surgeon:  Schnier, Dolores Lory, MD;  Location: [facility] ORS;  Service: Vascular;  Laterality: Left;  ? IR FLUORO GUIDE CV LINE RIGHT  04/06/2020  ? IR INJECT/THERA/INC NEEDLE/CATH/PLC EPI/CERV/THOR W/IMG  08/13/2020  ? IR RADIOLOGIST EVAL & MGMT  07/28/2019  ? IR RADIOLOGIST EVAL & MGMT  08/11/2019  ? LIGATION OF ARTERIOVENOUS  FISTULA Left 11/11/2019  ? LIGATION OF ARTERIOVENOUS  FISTULA Left 11/11/2019  ? Procedure: LIGATION OF ARTERIOVENOUS  FISTULA;  Surgeon: Katha Cabal, MD;  Location: [facility] ORS;  Service: Vascular;  Laterality: Left;  ? LIGATIONS OF HERO GRAFT Right 06/13/2020  ? Procedure: LIGATION / REMOVAL OF RIGHT HERO GRAFT;  Surgeon: Katha Cabal, MD;  Location: [facility] ORS;  Service: Vascular;  Laterality: Right;  ? PERIPHERAL VASCULAR CATHETERIZATION N/A 12/20/2015  ? Procedure: Thrombectomy of dialysis access versus permcath placement;  Surgeon: Algernon Huxley, MD;  Location: [place] CV LAB;  Service: Cardiovascular;  Laterality: N/A;  ? PERIPHERAL VASCULAR CATHETERIZATION  N/A 12/20/2015  ? Procedure: A/V Shunt Intervention;  Surgeon: Algernon Huxley, MD;  Location: [place] CV LAB;  Service: Cardiovascular;  Laterality: N/A;  ? PERIPHERAL VASCULAR CATHETERIZATION N/A 12/20/2015  ? Pr

## 2021-12-06 NOTE — Progress Notes (Signed)
?[facility]  ?ROUNDING NOTE  ? ?Subjective:  ? ?[name] is a 61 year old female with past medical history including diabetes, chronic diastolic heart failure, PVD, CAD, chronic respiratory failure with 2 L oxygen used at home, and end-stage renal disease on hemodialysis.  Patient presents to the emergency department with complaints of diarrhea, nausea, weakness and chills.  Patient has been admitted for under observation for Myalgia [M79.10] ?Pneumonia [J18.9] ?Community acquired pneumonia of left lower lobe of lung [J18.9] ?Bacterial pneumonia [J15.9] ? ?Patient is known to our practice and receives outpatient dialysis at [facility] on a MWF schedule, supervised by Dr Candiss Norse.  ? ?Update ?Patient eating breakfast ?Denies pain ?Continues to complains of generalized weakness, but states it's improving ? ? ?Objective:  ?Vital signs in last 24 hours:  ?Temp:  [97.9 ?F (36.6 ?C)-98.8 ?F (37.1 ?C)] 97.9 ?F (36.6 ?C) (03/17 0540) ?Pulse Rate:  [64-79] 64 (03/17 0540) ?Resp:  [20] 20 (03/17 0540) ?BP: (132-156)/(61-79) 140/61 (03/17 0540) ?SpO2:  [95 %-99 %] 99 % (03/17 0540) ? ?Weight change:  ?Filed Weights  ? 12/02/21 1530 12/02/21 1830 12/04/21 1907  ?Weight: 77.9 kg 77.3 kg 80.5 kg  ? ? ?Intake/Output: ?I/O last 3 completed shifts: ?In: 337 [P.O.:237; IV Piggyback:100] ?Out: 1000 [Other:1000] ?  ?Intake/Output this shift: ? No intake/output data recorded. ? ?Physical Exam: ?General: NAD  ?Head: Normocephalic, atraumatic. Moist oral mucosal membranes  ?Eyes: Anicteric  ?Lungs:  Wheezing, normal effort, 3 L [place]  ?Heart: Regular rate and rhythm  ?Abdomen:  Soft, nontender  ?Extremities: Trace peripheral edema  ?Neurologic: Nonfocal, moving all four extremities  ?Skin: No lesions  ?Access: Right PermCath  ? ? ?Basic Metabolic Panel: ?Recent Labs  ?Lab 11/29/21 ?1405 12/03/21 ?3734  ?NA 131* 130*  ?K 4.2 3.5  ?CL 93* 92*  ?CO2 27 29  ?GLUCOSE 78 161*  ?BUN 24* 12  ?CREATININE 6.10* 4.32*  ?CALCIUM 8.0*  8.2*  ?PHOS 6.6* 4.9*  ? ? ? ?Liver Function Tests: ?Recent Labs  ?Lab 11/29/21 ?1405 12/03/21 ?2876  ?ALBUMIN 2.8* 2.9*  ? ? ?No results for input(s): LIPASE, AMYLASE in the last 168 hours. ?No results for input(s): AMMONIA in the last 168 hours. ? ?CBC: ?Recent Labs  ?Lab 11/29/21 ?1405  ?WBC 4.4  ?HGB 12.3  ?HCT 39.1  ?MCV 88.3  ?PLT 282  ? ? ? ?Cardiac Enzymes: ?No results for input(s): CKTOTAL, CKMB, CKMBINDEX, TROPONINI in the last 168 hours. ? ?BNP: ?Invalid input(s): POCBNP ? ?CBG: ?Recent Labs  ?Lab 11/30/21 ?1627 12/01/21 ?0105 12/01/21 ?0902 12/01/21 ?1140 12/01/21 ?1600  ?GLUCAP 213* 184* 127* 120* 136*  ? ? ? ?Microbiology: ?Results for orders placed or performed during the hospital encounter of 11/27/21  ?Resp Panel by RT-PCR (Flu A&B, Covid) Nasopharyngeal Swab     Status: Abnormal  ? Collection Time: 11/27/21  3:54 AM  ? Specimen: Nasopharyngeal Swab; Nasopharyngeal(NP) swabs in vial transport medium  ?Result Value Ref Range Status  ? SARS Coronavirus 2 by RT PCR POSITIVE (A) NEGATIVE Final  ?  Comment: (NOTE) ?SARS-CoV-2 target nucleic acids are DETECTED. ? ?The SARS-CoV-2 RNA is generally detectable in upper respiratory ?specimens during the acute phase of infection. Positive results are ?indicative of the presence of the identified virus, but do not rule ?out bacterial infection or co-infection with other pathogens not ?detected by the test. Clinical correlation with patient history and ?other diagnostic information is necessary to determine patient ?infection status. The expected result is Negative. ? ?Fact Sheet for  Patients: ?EntrepreneurPulse.com.au ? ?Fact Sheet for Healthcare Providers: ?IncredibleEmployment.be ? ?This test is not yet approved or cleared by the [place] FDA and  ?has been authorized for detection and/or diagnosis of SARS-CoV-2 by ?FDA under an Emergency Use Authorization (EUA).  This EUA will ?remain in effect (meaning this test can be  used) for the duration of  ?the COVID-19 declaration under Section 564(b)(1) of the A ct, 21 ?U.S.C. section 360bbb-3(b)(1), unless the authorization is ?terminated or revoked sooner. ? ?  ? Influenza A by PCR NEGATIVE NEGATIVE Final  ? Influenza B by PCR NEGATIVE NEGATIVE Final  ?  Comment: (NOTE) ?The Xpert Xpress SARS-CoV-2/FLU/RSV plus assay is intended as an aid ?in the diagnosis of influenza from Nasopharyngeal swab specimens and ?should not be used as a sole basis for treatment. Nasal washings and ?aspirates are unacceptable for Xpert Xpress SARS-CoV-2/FLU/RSV ?testing. ? ?Fact Sheet for Patients: ?EntrepreneurPulse.com.au ? ?Fact Sheet for Healthcare Providers: ?IncredibleEmployment.be ? ?This test is not yet approved or cleared by the [place] FDA and ?has been authorized for detection and/or diagnosis of SARS-CoV-2 by ?FDA under an Emergency Use Authorization (EUA). This EUA will remain ?in effect (meaning this test can be used) for the duration of the ?COVID-19 declaration under Section 564(b)(1) of the Act, 21 U.S.C. ?section 360bbb-3(b)(1), unless the authorization is terminated or ?revoked. ? ?Performed at [facility], [place], ?[place] [address] ?  ?Blood culture (single)     Status: None  ? Collection Time: 11/27/21  5:04 AM  ? Specimen: BLOOD  ?Result Value Ref Range Status  ? Specimen Description BLOOD LEFT FA  Final  ? Special Requests   Final  ?  BOTTLES DRAWN AEROBIC AND ANAEROBIC Blood Culture adequate volume  ? Culture   Final  ?  NO GROWTH 5 DAYS ?Performed at [facility], [address], [place], [place] [address] ?  ? Report Status 12/02/2021 FINAL  Final  ?Expectorated Sputum Assessment w Gram Stain, Rflx to Resp Cult     Status: None  ? Collection Time: 11/27/21  6:57 AM  ? Specimen: Expectorated Sputum  ?Result Value Ref Range Status  ? Specimen Description EXPECTORATED SPUTUM  Final  ? Special Requests NONE   Final  ? Sputum evaluation   Final  ?  THIS SPECIMEN IS ACCEPTABLE FOR SPUTUM CULTURE ?Performed at [facility], [address] [place], [place] [address] ?  ? Report Status 11/27/2021 FINAL  Final  ?Culture, Respiratory w Gram Stain     Status: None  ? Collection Time: 11/27/21  6:57 AM  ?Result Value Ref Range Status  ? Specimen Description   Final  ?  EXPECTORATED SPUTUM ?Performed at [facility], [address], [place], [place] [address] ?  ? Special Requests   Final  ?  NONE Reflexed from [id number] ?Performed at [facility], [address], [place], [place] [address] ?  ? Gram Stain   Final  ?  MODERATE SQUAMOUS EPITHELIAL CELLS PRESENT ?FEW WBC PRESENT,BOTH PMN AND MONONUCLEAR ?FEW BUDDING YEAST SEEN ?FEW GRAM POSITIVE COCCI ?FEW GRAM NEGATIVE RODS ?Performed at [facility], [place] [address] [place], [place] [address] ?  ? Culture   Final  ?  ABUNDANT ESCHERICHIA COLI ?Confirmed Extended Spectrum Beta-Lactamase Producer (ESBL).  In bloodstream infections from ESBL organisms, carbapenems are preferred over piperacillin/tazobactam. They are shown to have a lower risk of mortality. ?  ? Report Status 11/30/2021 FINAL  Final  ?  Organism ID, Bacteria ESCHERICHIA COLI  Final  ?    Susceptibility  ? Escherichia coli - MIC*  ?  AMPICILLIN >=32 RESISTANT Resistant   ?  CEFAZOLIN >=64 RESISTANT Resistant   ?  CEFEPIME >=32 RESISTANT Resistant   ?  CEFTAZIDIME RESISTANT Resistant   ?  CEFTRIAXONE >=64 RESISTANT Resistant   ?  CIPROFLOXACIN >=4 RESISTANT Resistant   ?  GENTAMICIN <=1 SENSITIVE Sensitive   ?  IMIPENEM <=0.25 SENSITIVE Sensitive   ?  TRIMETH/SULFA <=20 SENSITIVE Sensitive   ?  AMPICILLIN/SULBACTAM >=32 RESISTANT Resistant   ?  PIP/TAZO 64 INTERMEDIATE Intermediate   ?  * ABUNDANT ESCHERICHIA COLI  ? ? ?Coagulation Studies: ?No results for input(s): LABPROT, INR in the last 72 hours. ? ?Urinalysis: ?No results for input(s): COLORURINE, LABSPEC, [place], GLUCOSEU, HGBUR,  BILIRUBINUR, KETONESUR, PROTEINUR, UROBILINOGEN, NITRITE, LEUKOCYTESUR in the last 72 hours. ? ?Invalid input(s): APPERANCEUR  ? ? ?Imaging: ?ECHOCARDIOGRAM COMPLETE ? ?Result Date: 12/04/2021 ?   [place]

## 2021-12-06 NOTE — TOC Progression Note (Addendum)
Transition of Care (TOC) - Progression Note  ? ? ?Patient Details  ?Name: [name] ?MRN: [id number] ?Date of Birth: Sep 10, 1961 ? ?Transition of Care (TOC) CM/SW Contact  ?Candie Chroman, LCSW ?Phone Number: ?12/06/2021, 8:39 AM ? ?Clinical Narrative:   Insurance authorization approved: [id number]. Valid 3/16-3/20. ? ?12:13 pm: RN helped CSW speak with patient on the phone. Notified her of plan to discharge to [facility] today and she is agreeable. Tried calling son but no answer, vm is full. RN will notify CSW when she gets back from HD so transport can be arranged. ? ?  ?Barriers to Discharge: SNF Pending bed offer ? ?Expected Discharge Plan and Services ?  ?  ?  ?  ?  ?                ?  ?  ?  ?  ?  ?  ?  ?  ?  ?  ? ? ?Social Determinants of Health (SDOH) Interventions ?  ? ?Readmission Risk Interventions ?Readmission Risk Prevention Plan 05/03/2021 12/11/2020 09/21/2020  ?Transportation Screening Complete Complete Complete  ?Medication Review Press photographer) Complete Complete Complete  ?PCP or Specialist appointment within 3-5 days of discharge - Complete Complete  ?[place] or Home Care Consult - Complete Complete  ?SW Recovery Care/Counseling Consult - Complete Complete  ?Palliative Care Screening Complete Not Applicable Complete  ?Skilled Nursing Facility - Not Complete Complete  ?SNF Comments - working on placement -  ?Some recent data might be hidden  ? ? ?

## 2021-12-06 NOTE — Progress Notes (Signed)
Pt dc'd to [facility] via EMS. Belongings in hand, bedtime meds given to patient as she possible wouldn't be able to get them at [facility] due to late discharge. Son took a bag of belongings with him. ?

## 2021-12-23 NOTE — Progress Notes (Incomplete)
? ?12/23/21 ?4:53 PM  ? ?[name] ?08-08-1961 ?[id number] ? ?Referring provider:  ?Denton Lank, MD ?[address] ?[place],  [place] [address] ?No chief complaint on file. ? ? ? ?HPI: ?[name] is a 61 y.o.female with a personal history of  with multiple medical comorbidities including end-stage renal disease on dialysis, COPD and CHF, and renal mass , who presents today for a follow-up per cancer center.  ? ?CT scan from 12/07/2018 shows interval growth of the lesion since 2018, the right medial upper pole lesion now measures 14 x 13 x 12.5 mm, up from 11.5 x 10.5 x 9.5 in 2019. In addition, an enhancing upper mid pole posterior lesion now measures 12.0 x 10.5 mm x 15 mm, up from 11.5 x 10.0 x 13 mm.  ? ?Her most recent CT on 11/15/2021 visualized Stable cyst in the interpolar region ?of the left kidney. Bladder is unremarkable. ? ?She was last seen in clinic in 2020. She has had multiple canceled appointments.  ?  ? ?PMH: ?Past Medical History:  ?Diagnosis Date  ? Anemia   ? Anginal pain ([place])   ? Anxiety   ? Arthritis   ? Asthma   ? Broken wrist   ? Bronchitis   ? chronic diastolic CHF 7/67/3419  ? Chronic kidney disease   ? COPD (chronic obstructive pulmonary disease) ([place])   ? Coronary artery disease   ? a. cath 2013: stenting to RCA (report not available); b. cath 2014: LM nl, pLAD 40%, mLAD nl, ost LCx 40%, mid LCx nl, pRCA 30% @ site of prior stent, mRCA 50%  ? Depression   ? Diabetes mellitus ([place])   ? Diabetes mellitus without complication ([place])   ? Diabetic neuropathy ([place])   ? dialysis 2006  ? Diverticulosis   ? Dizziness   ? Dyspnea   ? Elevated lipids   ? Environmental and seasonal allergies   ? ESRD (end stage renal disease) on dialysis [facility])   ? M-W-F  ? Gastroparesis   ? GERD (gastroesophageal reflux disease)   ? Headache   ? History of anemia due to chronic kidney disease   ? History of hiatal hernia   ? HOH (hard of hearing)   ? Hx of pancreatitis 2015  ? Hypertension   ? Lower  extremity edema   ? Mitral regurgitation   ? a. echo 10/2013: EF 62%, noWMA, mildly dilated LA, mild to mod MR/TR, GR1DD  ? Myocardial infarction [facility])   ? Orthopnea   ? Parathyroid abnormality ([place])   ? Peripheral arterial disease ([place])   ? Pneumonia   ? Renal cancer ([place])   ? Renal insufficiency   ? Pt is on dialysis on M,W + F.  ? Wheezing   ? ? ?Surgical History: ?Past Surgical History:  ?Procedure Laterality Date  ? A/V SHUNTOGRAM Left 01/20/2018  ? Procedure: A/V SHUNTOGRAM;  Surgeon: Algernon Huxley, MD;  Location: [place] [facility];  Service: Cardiovascular;  Laterality: Left;  ? ABDOMINAL HYSTERECTOMY  1992  ? AMPUTATION TOE Left 10/02/2017  ? Procedure: AMPUTATION TOE-LEFT GREAT TOE;  Surgeon: Albertine Patricia, DPM;  Location: [facility] ORS;  Service: Podiatry;  Laterality: Left;  ? APPENDECTOMY    ? APPLICATION OF WOUND VAC N/A 11/25/2019  ? Procedure: APPLICATION OF WOUND VAC;  Surgeon: Katha Cabal, MD;  Location: [facility];  Service: Vascular;  Laterality: N/A;  ? ARTERY BIOPSY Right 10/11/2018  ? Procedure: BIOPSY TEMPORAL ARTERY;  Surgeon:  Vickie Epley, MD;  Location: [facility];  Service: General;  Laterality: Right;  ? CARDIAC CATHETERIZATION Left 07/26/2015  ? Procedure: Left Heart Cath and Coronary Angiography;  Surgeon: Dionisio David, MD;  Location: [place] [facility];  Service: Cardiovascular;  Laterality: Left;  ? CATARACT EXTRACTION W/ INTRAOCULAR LENS IMPLANT Right   ? CATARACT EXTRACTION W/PHACO Left 03/10/2017  ? Procedure: CATARACT EXTRACTION PHACO AND INTRAOCULAR LENS PLACEMENT (IOC);  Surgeon: Birder Robson, MD;  Location: [facility];  Service: Ophthalmology;  Laterality: Left;  [place] 00:51.9 ?AP% 14.2 ?CDE 7.39 ?fluid pack lot # [id number]  ? CENTRAL LINE INSERTION Right 11/11/2019  ? Procedure: CENTRAL LINE INSERTION;  Surgeon: Katha Cabal, MD;  Location: [facility];  Service: Vascular;  Laterality: Right;  ? CENTRAL LINE INSERTION  11/25/2019  ? Procedure: CENTRAL LINE INSERTION;   Surgeon: Delana Meyer Dolores Lory, MD;  Location: [facility];  Service: Vascular;;  ? CHOLECYSTECTOMY    ? COLONOSCOPY WITH PROPOFOL N/A 08/12/2016  ? Procedure: COLONOSCOPY WITH PROPOFOL;  Surgeon: Lollie Sails, MD;  Location: [facility] ENDOSCOPY;  Service: Endoscopy;  Laterality: N/A;  ? DIALYSIS FISTULA CREATION Left   ? upper arm  ? dialysis grafts    ? DIALYSIS/PERMA CATHETER INSERTION N/A 11/14/2019  ? Procedure: DIALYSIS/PERMA CATHETER INSERTION;  Surgeon: Algernon Huxley, MD;  Location: [place] [facility];  Service: Cardiovascular;  Laterality: N/A;  ? DIALYSIS/PERMA CATHETER INSERTION N/A 02/03/2020  ? Procedure: DIALYSIS/PERMA CATHETER INSERTION;  Surgeon: Katha Cabal, MD;  Location: [place] [facility];  Service: Cardiovascular;  Laterality: N/A;  ? DIALYSIS/PERMA CATHETER INSERTION N/A 06/19/2020  ? Procedure: DIALYSIS/PERMA CATHETER INSERTION;  Surgeon: Katha Cabal, MD;  Location: [place] [facility];  Service: Cardiovascular;  Laterality: N/A;  ? DIALYSIS/PERMA CATHETER INSERTION N/A 05/07/2021  ? Procedure: DIALYSIS/PERMA CATHETER INSERTION;  Surgeon: Katha Cabal, MD;  Location: [place] [facility];  Service: Cardiovascular;  Laterality: N/A;  Patient needs general anethesia  ? DIALYSIS/PERMA CATHETER REMOVAL N/A 05/25/2020  ? Procedure: DIALYSIS/PERMA CATHETER REMOVAL;  Surgeon: Katha Cabal, MD;  Location: [place] [facility];  Service: Cardiovascular;  Laterality: N/A;  ? ESOPHAGOGASTRODUODENOSCOPY N/A 03/08/2015  ? Procedure: ESOPHAGOGASTRODUODENOSCOPY (EGD);  Surgeon: Manya Silvas, MD;  Location: [facility] ENDOSCOPY;  Service: Endoscopy;  Laterality: N/A;  ? ESOPHAGOGASTRODUODENOSCOPY (EGD) WITH PROPOFOL N/A 03/18/2016  ? Procedure: ESOPHAGOGASTRODUODENOSCOPY (EGD) WITH PROPOFOL;  Surgeon: [name], MD;  Location: [facility] ENDOSCOPY;  Service: Endoscopy;  Laterality: N/A;  ? EYE SURGERY Right 2018  ? FECAL TRANSPLANT N/A 08/23/2015  ? Procedure: FECAL TRANSPLANT;  Surgeon: Manya Silvas, MD;  Location: [facility] ENDOSCOPY;  Service: Endoscopy;  Laterality: N/A;  ? HAND SURGERY Bilateral   ? HEMATOMA EVACUATION Left 11/25/2019  ? Procedure: EVACUATION HEMATOMA;  Surgeon: Katha Cabal, MD;  Location: [facility];  Service: Vascular;  Laterality: Left;  ? I & D EXTREMITY Left 11/25/2019  ? Procedure: IRRIGATION AND DEBRIDEMENT EXTREMITY;  Surgeon: Katha Cabal, MD;  Location: [facility];  Service: Vascular;  Laterality: Left;  ? IR FLUORO GUIDE CV LINE RIGHT  04/06/2020  ? IR INJECT/THERA/INC NEEDLE/CATH/PLC EPI/CERV/THOR W/IMG  08/13/2020  ? IR RADIOLOGIST EVAL & MGMT  07/28/2019  ? IR RADIOLOGIST EVAL & MGMT  08/11/2019  ? LIGATION OF ARTERIOVENOUS  FISTULA Left 11/11/2019  ? LIGATION OF ARTERIOVENOUS  FISTULA Left 11/11/2019  ? Procedure: LIGATION OF ARTERIOVENOUS  FISTULA;  Surgeon: Katha Cabal, MD;  Location: [facility];  Service: Vascular;  Laterality: Left;  ? LIGATIONS OF HERO GRAFT Right 06/13/2020  ? Procedure: LIGATION / REMOVAL OF RIGHT HERO GRAFT;  Surgeon: Katha Cabal, MD;  Location: [facility];  Service: Vascular;  Laterality: Right;  ? PERIPHERAL VASCULAR CATHETERIZATION N/A 12/20/2015  ? Procedure: Thrombectomy of dialysis access versus permcath placement;  Surgeon: Algernon Huxley, MD;  Location: [place] [facility];  Service: Cardiovascular;  Laterality: N/A;  ? PERIPHERAL VASCULAR CATHETERIZATION N/A 12/20/2015  ? Procedure: A/V Shunt Intervention;  Surgeon: Algernon Huxley, MD;  Location: [place] [facility];  Service: Cardiovascular;  Laterality: N/A;  ? PERIPHERAL VASCULAR CATHETERIZATION N/A 12/20/2015  ? Procedure: A/V Shuntogram/Fistulagram;  Surgeon: Algernon Huxley, MD;  Location: [place] [facility];  Service: Cardiovascular;  Laterality: N/A;  ? PERIPHERAL VASCULAR CATHETERIZATION N/A 01/02/2016  ? Procedure: A/V Shuntogram/Fistulagram;  Surgeon: Algernon Huxley, MD;  Location: [place] [facility];  Service: Cardiovascular;  Laterality: N/A;  ? PERIPHERAL VASCULAR  CATHETERIZATION N/A 01/02/2016  ? Procedure: A/V Shunt Intervention;  Surgeon: Algernon Huxley, MD;  Location: [place] [facility];  Service: Cardiovascular;  Laterality: N/A;  ? TEE WITHOUT CARDIOVERSION N/A 06/11/2020  ? Procedure

## 2021-12-24 ENCOUNTER — Ambulatory Visit: Payer: Medicaid Other | Admitting: Urology

## 2022-01-02 ENCOUNTER — Ambulatory Visit: Payer: Medicaid Other | Admitting: Urology

## 2022-01-20 NOTE — Progress Notes (Incomplete)
? ?01/20/22 ?2:07 PM  ? ?[name] ?July 06, 1961 ?[id number] ? ?Referring provider:  ?Denton Lank, MD ?[address] ?[place],  [place] [address] ?No chief complaint on file. ? ? ? ? ?HPI: ?[name] is a 61 y.o.female with multiple medical comorbidity including end-stage renal disease and right renal mass x2. Who presents today for a follow-up per recommendation from cancer center.  ? ?CT scan from 12/07/2018 shows interval growth of the lesion since 2018, the right medial upper pole lesion now measures 14 x 13 x 12.5 mm, up from 11.5 x 10.5 x 9.5 in 2019. ?  ?In addition, an enhancing upper mid pole posterior lesion now measures 12.0 x 10.5 mm x 15 mm, up from 11.5 x 10.0 x 13 mm.  ? ?Her most recent imaging in the form of CT abdomen and pelvis on 11/15/2021 visualized Marked atrophy of bilateral kidneys. Stable cyst in the interpolar region of the left kidney. Bladder is unremarkable. ? ? ? ? ?PMH: ?Past Medical History:  ?Diagnosis Date  ? Anemia   ? Anginal pain ([place])   ? Anxiety   ? Arthritis   ? Asthma   ? Broken wrist   ? Bronchitis   ? chronic diastolic CHF 12/18/9240  ? Chronic kidney disease   ? COPD (chronic obstructive pulmonary disease) ([place])   ? Coronary artery disease   ? a. cath 2013: stenting to RCA (report not available); b. cath 2014: LM nl, pLAD 40%, mLAD nl, ost LCx 40%, mid LCx nl, pRCA 30% @ site of prior stent, mRCA 50%  ? Depression   ? Diabetes mellitus ([place])   ? Diabetes mellitus without complication ([place])   ? Diabetic neuropathy ([place])   ? dialysis 2006  ? Diverticulosis   ? Dizziness   ? Dyspnea   ? Elevated lipids   ? Environmental and seasonal allergies   ? ESRD (end stage renal disease) on dialysis [facility])   ? M-W-F  ? Gastroparesis   ? GERD (gastroesophageal reflux disease)   ? Headache   ? History of anemia due to chronic kidney disease   ? History of hiatal hernia   ? HOH (hard of hearing)   ? Hx of pancreatitis 2015  ? Hypertension   ? Lower extremity edema   ? Mitral  regurgitation   ? a. echo 10/2013: EF 62%, noWMA, mildly dilated LA, mild to mod MR/TR, GR1DD  ? Myocardial infarction [facility])   ? Orthopnea   ? Parathyroid abnormality ([place])   ? Peripheral arterial disease ([place])   ? Pneumonia   ? Renal cancer ([place])   ? Renal insufficiency   ? Pt is on dialysis on M,W + F.  ? Wheezing   ? ? ?Surgical History: ?Past Surgical History:  ?Procedure Laterality Date  ? A/V SHUNTOGRAM Left 01/20/2018  ? Procedure: A/V SHUNTOGRAM;  Surgeon: Algernon Huxley, MD;  Location: [place] [facility];  Service: Cardiovascular;  Laterality: Left;  ? ABDOMINAL HYSTERECTOMY  1992  ? AMPUTATION TOE Left 10/02/2017  ? Procedure: AMPUTATION TOE-LEFT GREAT TOE;  Surgeon: Albertine Patricia, DPM;  Location: [facility];  Service: Podiatry;  Laterality: Left;  ? APPENDECTOMY    ? APPLICATION OF WOUND VAC N/A 11/25/2019  ? Procedure: APPLICATION OF WOUND VAC;  Surgeon: Katha Cabal, MD;  Location: [facility];  Service: Vascular;  Laterality: N/A;  ? ARTERY BIOPSY Right 10/11/2018  ? Procedure: BIOPSY TEMPORAL ARTERY;  Surgeon: Vickie Epley, MD;  Location: [facility]  ORS;  Service: General;  Laterality: Right;  ? CARDIAC CATHETERIZATION Left 07/26/2015  ? Procedure: Left Heart Cath and Coronary Angiography;  Surgeon: Dionisio David, MD;  Location: [place] [facility];  Service: Cardiovascular;  Laterality: Left;  ? CATARACT EXTRACTION W/ INTRAOCULAR LENS IMPLANT Right   ? CATARACT EXTRACTION W/PHACO Left 03/10/2017  ? Procedure: CATARACT EXTRACTION PHACO AND INTRAOCULAR LENS PLACEMENT (IOC);  Surgeon: Birder Robson, MD;  Location: [facility];  Service: Ophthalmology;  Laterality: Left;  [place] 00:51.9 ?AP% 14.2 ?CDE 7.39 ?fluid pack lot # [id number]  ? CENTRAL LINE INSERTION Right 11/11/2019  ? Procedure: CENTRAL LINE INSERTION;  Surgeon: Katha Cabal, MD;  Location: [facility];  Service: Vascular;  Laterality: Right;  ? CENTRAL LINE INSERTION  11/25/2019  ? Procedure: CENTRAL LINE INSERTION;  Surgeon: Delana Meyer Dolores Lory, MD;   Location: [facility];  Service: Vascular;;  ? CHOLECYSTECTOMY    ? COLONOSCOPY WITH PROPOFOL N/A 08/12/2016  ? Procedure: COLONOSCOPY WITH PROPOFOL;  Surgeon: Lollie Sails, MD;  Location: [facility] ENDOSCOPY;  Service: Endoscopy;  Laterality: N/A;  ? DIALYSIS FISTULA CREATION Left   ? upper arm  ? dialysis grafts    ? DIALYSIS/PERMA CATHETER INSERTION N/A 11/14/2019  ? Procedure: DIALYSIS/PERMA CATHETER INSERTION;  Surgeon: Algernon Huxley, MD;  Location: [place] [facility];  Service: Cardiovascular;  Laterality: N/A;  ? DIALYSIS/PERMA CATHETER INSERTION N/A 02/03/2020  ? Procedure: DIALYSIS/PERMA CATHETER INSERTION;  Surgeon: Katha Cabal, MD;  Location: [place] [facility];  Service: Cardiovascular;  Laterality: N/A;  ? DIALYSIS/PERMA CATHETER INSERTION N/A 06/19/2020  ? Procedure: DIALYSIS/PERMA CATHETER INSERTION;  Surgeon: Katha Cabal, MD;  Location: [place] [facility];  Service: Cardiovascular;  Laterality: N/A;  ? DIALYSIS/PERMA CATHETER INSERTION N/A 05/07/2021  ? Procedure: DIALYSIS/PERMA CATHETER INSERTION;  Surgeon: Katha Cabal, MD;  Location: [place] [facility];  Service: Cardiovascular;  Laterality: N/A;  Patient needs general anethesia  ? DIALYSIS/PERMA CATHETER REMOVAL N/A 05/25/2020  ? Procedure: DIALYSIS/PERMA CATHETER REMOVAL;  Surgeon: Katha Cabal, MD;  Location: [place] [facility];  Service: Cardiovascular;  Laterality: N/A;  ? ESOPHAGOGASTRODUODENOSCOPY N/A 03/08/2015  ? Procedure: ESOPHAGOGASTRODUODENOSCOPY (EGD);  Surgeon: Manya Silvas, MD;  Location: [facility] ENDOSCOPY;  Service: Endoscopy;  Laterality: N/A;  ? ESOPHAGOGASTRODUODENOSCOPY (EGD) WITH PROPOFOL N/A 03/18/2016  ? Procedure: ESOPHAGOGASTRODUODENOSCOPY (EGD) WITH PROPOFOL;  Surgeon: [name], MD;  Location: [facility] ENDOSCOPY;  Service: Endoscopy;  Laterality: N/A;  ? EYE SURGERY Right 2018  ? FECAL TRANSPLANT N/A 08/23/2015  ? Procedure: FECAL TRANSPLANT;  Surgeon: Manya Silvas, MD;  Location: [facility]  ENDOSCOPY;  Service: Endoscopy;  Laterality: N/A;  ? HAND SURGERY Bilateral   ? HEMATOMA EVACUATION Left 11/25/2019  ? Procedure: EVACUATION HEMATOMA;  Surgeon: Katha Cabal, MD;  Location: [facility];  Service: Vascular;  Laterality: Left;  ? I & D EXTREMITY Left 11/25/2019  ? Procedure: IRRIGATION AND DEBRIDEMENT EXTREMITY;  Surgeon: Katha Cabal, MD;  Location: [facility];  Service: Vascular;  Laterality: Left;  ? IR FLUORO GUIDE CV LINE RIGHT  04/06/2020  ? IR INJECT/THERA/INC NEEDLE/CATH/PLC EPI/CERV/THOR W/IMG  08/13/2020  ? IR RADIOLOGIST EVAL & MGMT  07/28/2019  ? IR RADIOLOGIST EVAL & MGMT  08/11/2019  ? LIGATION OF ARTERIOVENOUS  FISTULA Left 11/11/2019  ? LIGATION OF ARTERIOVENOUS  FISTULA Left 11/11/2019  ? Procedure: LIGATION OF ARTERIOVENOUS  FISTULA;  Surgeon: Katha Cabal, MD;  Location: [facility];  Service: Vascular;  Laterality: Left;  ? [place]  GRAFT Right 06/13/2020  ? Procedure: LIGATION / REMOVAL OF RIGHT HERO GRAFT;  Surgeon: Katha Cabal, MD;  Location: [facility];  Service: Vascular;  Laterality: Right;  ? PERIPHERAL VASCULAR CATHETERIZATION N/A 12/20/2015  ? Procedure: Thrombectomy of dialysis access versus permcath placement;  Surgeon: Algernon Huxley, MD;  Location: [place] [facility];  Service: Cardiovascular;  Laterality: N/A;  ? PERIPHERAL VASCULAR CATHETERIZATION N/A 12/20/2015  ? Procedure: A/V Shunt Intervention;  Surgeon: Algernon Huxley, MD;  Location: [place] [facility] LAB;  Service: Cardiovascular;  Laterality: N/A;  ? PERIPHERAL VASCULAR CATHETERIZATION N/A 12/20/2015  ? Procedure: A/V Shuntogram/Fistulagram;  Surgeon: Algernon Huxley, MD;  Location: [place] [facility] LAB;  Service: Cardiovascular;  Laterality: N/A;  ? PERIPHERAL VASCULAR CATHETERIZATION N/A 01/02/2016  ? Procedure: A/V Shuntogram/Fistulagram;  Surgeon: Algernon Huxley, MD;  Location: [place] [facility] LAB;  Service: Cardiovascular;  Laterality: N/A;  ? PERIPHERAL VASCULAR CATHETERIZATION N/A 01/02/2016  ?  Procedure: A/V Shunt Intervention;  Surgeon: Algernon Huxley, MD;  Location: [place] [facility] LAB;  Service: Cardiovascular;  Laterality: N/A;  ? TEE WITHOUT CARDIOVERSION N/A 06/11/2020  ? Procedure: TRANSESOPHAGEAL [facility]

## 2022-01-21 ENCOUNTER — Ambulatory Visit: Payer: Medicaid Other | Admitting: Urology

## 2022-02-05 ENCOUNTER — Ambulatory Visit: Payer: Medicare Other | Admitting: Urology

## 2022-02-07 ENCOUNTER — Ambulatory Visit: Payer: Medicare Other | Admitting: Urology

## 2022-02-07 ENCOUNTER — Telehealth: Payer: Self-pay | Admitting: Urology

## 2022-02-07 NOTE — Progress Notes (Incomplete)
02/07/22 5:58 AM   [name] Mar 25, 1961 [id number]  Referring provider:  Denton Lank, MD [address] [place],  [place] [address] No chief complaint on file.     HPI: [name] is a 61 y.o.female with a personal history of multiple medical comorbidities including end-stage renal disease on dialysis, COPD and CHF is being followed for right renal mass x2.  She is last seen in 06/2019.  She is being followed by oncology for abnormal SPEP with the presence of M spike. She was encouraged to follow-up with [place].   Her most recent imaging on 11/15/2021 in the form of CT abdomen and pelvis visualized marked atrophy of bilateral kidneys. Stable cyst in the interpolar region of the left kidney. Bladder is unremarkable.  She has multiple cancelled appointments and has not received an MRI to evaluate renal mass.       PMH: Past Medical History:  Diagnosis Date   Anemia    Anginal pain ([place])    Anxiety    Arthritis    Asthma    Broken wrist    Bronchitis    chronic diastolic CHF 3/66/2947   Chronic kidney disease    COPD (chronic obstructive pulmonary disease) ([place])    Coronary artery disease    a. cath 2013: stenting to RCA (report not available); b. cath 2014: LM nl, pLAD 40%, mLAD nl, ost LCx 40%, mid LCx nl, pRCA 30% @ site of prior stent, mRCA 50%   Depression    Diabetes mellitus ([place])    Diabetes mellitus without complication ([place])    Diabetic neuropathy ([place])    dialysis 2006   Diverticulosis    Dizziness    Dyspnea    Elevated lipids    Environmental and seasonal allergies    ESRD (end stage renal disease) on dialysis ([place])    M-W-F   Gastroparesis    GERD (gastroesophageal reflux disease)    Headache    History of anemia due to chronic kidney disease    History of hiatal hernia    HOH (hard of hearing)    Hx of pancreatitis 2015   Hypertension    Lower extremity edema    Mitral regurgitation    a. echo 10/2013: EF 62%, noWMA, mildly dilated  LA, mild to mod MR/TR, GR1DD   Myocardial infarction ([place])    Orthopnea    Parathyroid abnormality ([place])    Peripheral arterial disease ([place])    Pneumonia    Renal cancer ([place])    Renal insufficiency    Pt is on dialysis on M,W + F.   Wheezing     Surgical History: Past Surgical History:  Procedure Laterality Date   A/V SHUNTOGRAM Left 01/20/2018   Procedure: A/V SHUNTOGRAM;  Surgeon: Algernon Huxley, MD;  Location: [place] [facility];  Service: Cardiovascular;  Laterality: Left;   ABDOMINAL HYSTERECTOMY  1992   AMPUTATION TOE Left 10/02/2017   Procedure: AMPUTATION TOE-LEFT GREAT TOE;  Surgeon: Albertine Patricia, DPM;  Location: [facility];  Service: Podiatry;  Laterality: Left;   APPENDECTOMY     APPLICATION OF WOUND VAC N/A 11/25/2019   Procedure: APPLICATION OF WOUND VAC;  Surgeon: Katha Cabal, MD;  Location: [facility];  Service: Vascular;  Laterality: N/A;   ARTERY BIOPSY Right 10/11/2018   Procedure: BIOPSY TEMPORAL ARTERY;  Surgeon: Vickie Epley, MD;  Location: [facility];  Service: General;  Laterality: Right;   CARDIAC CATHETERIZATION Left 07/26/2015   Procedure:  Left Heart Cath and Coronary Angiography;  Surgeon: Dionisio David, MD;  Location: [place] [facility];  Service: Cardiovascular;  Laterality: Left;   CATARACT EXTRACTION W/ INTRAOCULAR LENS IMPLANT Right    CATARACT EXTRACTION W/PHACO Left 03/10/2017   Procedure: CATARACT EXTRACTION PHACO AND INTRAOCULAR LENS PLACEMENT ([place]);  Surgeon: Birder Robson, MD;  Location: [facility];  Service: Ophthalmology;  Laterality: Left;  [place] 00:51.9 AP% 14.2 CDE 7.39 fluid pack lot # [id number]   CENTRAL LINE INSERTION Right 11/11/2019   Procedure: CENTRAL LINE INSERTION;  Surgeon: Katha Cabal, MD;  Location: [facility];  Service: Vascular;  Laterality: Right;   CENTRAL LINE INSERTION  11/25/2019   Procedure: CENTRAL LINE INSERTION;  Surgeon: Katha Cabal, MD;  Location: [facility];  Service: Vascular;;   CHOLECYSTECTOMY      COLONOSCOPY WITH PROPOFOL N/A 08/12/2016   Procedure: COLONOSCOPY WITH PROPOFOL;  Surgeon: Lollie Sails, MD;  Location[facility];  Service: Endoscopy;  Laterality: N/A;   DIALYSIS FISTULA CREATION Left    upper arm   dialysis grafts     DIALYSIS/PERMA CATHETER INSERTION N/A 11/14/2019   Procedure: DIALYSIS/PERMA CATHETER INSERTION;  Surgeon: Algernon Huxley, MD;  Location: [place] [facility];  Service: Cardiovascular;  Laterality: N/A;   DIALYSIS/PERMA CATHETER INSERTION N/A 02/03/2020   Procedure: DIALYSIS/PERMA CATHETER INSERTION;  Surgeon: Katha Cabal, MD;  Location: [place] [facility];  Service: Cardiovascular;  Laterality: N/A;   DIALYSIS/PERMA CATHETER INSERTION N/A 06/19/2020   Procedure: DIALYSIS/PERMA CATHETER INSERTION;  Surgeon: Katha Cabal, MD;  Location: [place] [facility];  Service: Cardiovascular;  Laterality: N/A;   DIALYSIS/PERMA CATHETER INSERTION N/A 05/07/2021   Procedure: DIALYSIS/PERMA CATHETER INSERTION;  Surgeon: Katha Cabal, MD;  Location: [place] [facility];  Service: Cardiovascular;  Laterality: N/A;  Patient needs general anethesia   DIALYSIS/PERMA CATHETER REMOVAL N/A 05/25/2020   Procedure: DIALYSIS/PERMA CATHETER REMOVAL;  Surgeon: Katha Cabal, MD;  Location: [place] [facility];  Service: Cardiovascular;  Laterality: N/A;   ESOPHAGOGASTRODUODENOSCOPY N/A 03/08/2015   Procedure: ESOPHAGOGASTRODUODENOSCOPY (EGD);  Surgeon: Manya Silvas, MD;  Location: [facility] ENDOSCOPY;  Service: Endoscopy;  Laterality: N/A;   ESOPHAGOGASTRODUODENOSCOPY (EGD) WITH PROPOFOL N/A 03/18/2016   Procedure: ESOPHAGOGASTRODUODENOSCOPY (EGD) WITH PROPOFOL;  Surgeon: [name], MD;  Location: [facility];  Service: Endoscopy;  Laterality: N/A;   EYE SURGERY Right 2018   FECAL TRANSPLANT N/A 08/23/2015   Procedure: FECAL TRANSPLANT;  Surgeon: Manya Silvas, MD;  Location: [facility];  Service: Endoscopy;  Laterality: N/A;   HAND SURGERY  Bilateral    HEMATOMA EVACUATION Left 11/25/2019   Procedure: EVACUATION HEMATOMA;  Surgeon: Katha Cabal, MD;  Location: [facility];  Service: Vascular;  Laterality: Left;   I & D EXTREMITY Left 11/25/2019   Procedure: IRRIGATION AND DEBRIDEMENT EXTREMITY;  Surgeon: Katha Cabal, MD;  Location: [facility];  Service: Vascular;  Laterality: Left;   IR FLUORO GUIDE CV LINE RIGHT  04/06/2020   IR INJECT/THERA/INC NEEDLE/CATH/PLC EPI/CERV/THOR W/IMG  08/13/2020   IR RADIOLOGIST EVAL & MGMT  07/28/2019   IR RADIOLOGIST EVAL & MGMT  08/11/2019   LIGATION OF ARTERIOVENOUS  FISTULA Left 11/11/2019   LIGATION OF ARTERIOVENOUS  FISTULA Left 11/11/2019   Procedure: LIGATION OF ARTERIOVENOUS  FISTULA;  Surgeon: Katha Cabal, MD;  Location: [facility];  Service: Vascular;  Laterality: Left;   LIGATIONS OF HERO GRAFT Right 06/13/2020   Procedure: LIGATION / REMOVAL OF RIGHT HERO GRAFT;  Surgeon: Delana Meyer,  Dolores Lory, MD;  Location: [facility];  Service: Vascular;  Laterality: Right;   PERIPHERAL VASCULAR CATHETERIZATION N/A 12/20/2015   Procedure: Thrombectomy of dialysis access versus permcath placement;  Surgeon: Algernon Huxley, MD;  Location: [place] [facility];  Service: Cardiovascular;  Laterality: N/A;   PERIPHERAL VASCULAR CATHETERIZATION N/A 12/20/2015   Procedure: A/V Shunt Intervention;  Surgeon: Algernon Huxley, MD;  Location: [place] [facility];  Service: Cardiovascular;  Laterality: N/A;   PERIPHERAL VASCULAR CATHETERIZATION N/A 12/20/2015   Procedure: A/V Shuntogram/Fistulagram;  Surgeon: Algernon Huxley, MD;  Location: [place] [facility];  Service: Cardiovascular;  Laterality: N/A;   PERIPHERAL VASCULAR CATHETERIZATION N/A 01/02/2016   Procedure: A/V Shuntogram/Fistulagram;  Surgeon: Algernon Huxley, MD;  Location: [place] [facility];  Service: Cardiovascular;  Laterality: N/A;   PERIPHERAL VASCULAR CATHETERIZATION N/A 01/02/2016   Procedure: A/V Shunt Intervention;  Surgeon: Algernon Huxley, MD;  Location:  [place] [facility];  Service: Cardiovascular;  Laterality: N/A;   TEE WITHOUT CARDIOVERSION N/A 06/11/2020   Procedure: TRANSESOPHAGEAL ECHOCARDIOGRAM (TEE);  Surgeon: Teodoro Spray, MD;  Location: [facility];  Service: Cardiovascular;  Laterality: N/A;   UPPER EXTREMITY ANGIOGRAPHY Bilateral 11/27/2020   Procedure: UPPER EXTREMITY ANGIOGRAPHY;  Surgeon: Katha Cabal, MD;  Location: [place] [facility];  Service: Cardiovascular;  Laterality: Bilateral;   UPPER EXTREMITY VENOGRAPHY Right 01/18/2020   Procedure: UPPER EXTREMITY VENOGRAPHY;  Surgeon: Katha Cabal, MD;  Location: [place] [facility];  Service: Cardiovascular;  Laterality: Right;   UPPER EXTREMITY VENOGRAPHY Bilateral 11/27/2020   Procedure: UPPER EXTREMITY VENOGRAPHY;  Surgeon: Katha Cabal, MD;  Location: [place] [facility];  Service: Cardiovascular;  Laterality: Bilateral;   UPPER EXTREMITY VENOGRAPHY Left 11/05/2021   Procedure: UPPER EXTREMITY VENOGRAPHY;  Surgeon: Katha Cabal, MD;  Location: [place] [facility];  Service: Cardiovascular;  Laterality: Left;   VASCULAR ACCESS DEVICE INSERTION Right 04/13/2020   Procedure: INSERTION OF HERO VASCULAR ACCESS DEVICE (GRAFT);  Surgeon: Katha Cabal, MD;  Location: [facility];  Service: Vascular;  Laterality: Right;    Home Medications:  Allergies as of 02/07/2022       Reactions   Ace Inhibitors Swelling, Anaphylaxis   Ativan [lorazepam] Other (See Comments)   Reaction:  Hallucinations and headaches   Compazine [prochlorperazine Edisylate] Anaphylaxis, Nausea And Vomiting, Other (See Comments)   Other reaction(s): dystonia from this vs. Reglan, 23 Jul - patient relates that she takes promethazine frequently with no problems   Sumatriptan Succinate Other (See Comments)   Other reaction(s): delirium and hallucinations per [facility] records   Zofran [ondansetron] Nausea And Vomiting   Per pt. she is allergic to zofran or will experience adverse reaction like  hallucination    Losartan Nausea Only   Prochlorperazine Other (See Comments)   Reaction:  Unknown . Patient does not remember reaction but she does have vertigo and anxiety along with n and v at times. Could be used to treat any of these   Reglan [metoclopramide] Other (See Comments)   Per patient her Dr. Evelina Bucy her off it    Scopolamine Other (See Comments)   Dizziness, also has vertigo already   Tape Rash   Plastic tape causes rash   Tapentadol Rash   Ultrasound Gel Itching   Patient states the Ultrasound gel caused itching while in skin contact and resolved when wiped away.        Medication List        Accurate  as of Feb 07, 2022  5:58 AM. If you have any questions, ask your nurse or doctor.          acetaminophen 325 MG tablet Commonly known as: TYLENOL Take 2 tablets (650 mg total) by mouth every 6 (six) hours as needed for mild pain, headache, moderate pain or fever (fever >/= 101).   albuterol (2.5 MG/3ML) 0.083% nebulizer solution Commonly known as: PROVENTIL Take 2.5 mg by nebulization every 4 (four) hours as needed for wheezing or shortness of breath.   albuterol 108 (90 Base) MCG/ACT inhaler Commonly known as: VENTOLIN HFA Inhale 2 puffs into the lungs every 4 (four) hours as needed for wheezing or shortness of breath.   alum & mag hydroxide-simeth 200-200-20 MG/5ML suspension Commonly known as: MAALOX/MYLANTA Take 15 mLs by mouth every 6 (six) hours as needed for indigestion or heartburn.   amLODipine 10 MG tablet Commonly known as: NORVASC Take 1 tablet (10 mg total) by mouth daily.   ammonium lactate 12 % lotion Commonly known as: LAC-HYDRIN Apply 1 application topically 2 (two) times daily as needed for dry skin.   aspirin EC 81 MG tablet Take 81 mg by mouth daily.   atorvastatin 20 MG tablet Commonly known as: LIPITOR Take 20 mg by mouth every evening.   calcium carbonate 500 MG chewable tablet Commonly known as: TUMS - dosed in mg elemental  calcium Chew 2.5 tablets (500 mg of elemental calcium total) by mouth every 6 (six) hours as needed for indigestion.   cyanocobalamin 1000 MCG tablet Take 1,000 mcg by mouth daily.   epoetin alfa 10000 UNIT/ML injection Commonly known as: EPOGEN Inject 1 mL (10,000 Units total) into the vein every Monday, Wednesday, and Friday with hemodialysis.   feeding supplement (NEPRO CARB STEADY) Liqd Take 237 mLs by mouth 2 (two) times daily between meals.   fluticasone 50 MCG/ACT nasal spray Commonly known as: FLONASE Place 2 sprays into both nostrils daily.   gabapentin 100 MG capsule Commonly known as: NEURONTIN Take 100 mg by mouth at bedtime.   guaiFENesin-dextromethorphan 100-10 MG/5ML syrup Commonly known as: ROBITUSSIN DM Take 10 mLs by mouth every 4 (four) hours as needed for cough.   hydrALAZINE 100 MG tablet Commonly known as: APRESOLINE Take 0.5 tablets (50 mg total) by mouth 2 (two) times daily.   Iron 325 (65 Fe) MG Tabs Take 325 mg by mouth daily.   lidocaine 5 % Commonly known as: LIDODERM Place 1 patch onto the skin daily. (Remove after 12 hours -- leave off for 12 hours before applying new patch)   meclizine 25 MG tablet Commonly known as: ANTIVERT Take 25 mg by mouth every 6 (six) hours as needed for dizziness.   mirtazapine 7.5 MG tablet Commonly known as: REMERON Take 7.5 mg by mouth daily.   multivitamin Tabs tablet Take 1 tablet by mouth daily.   mupirocin ointment 2 % Commonly known as: BACTROBAN Apply 1 application topically daily as needed (leg rash).   nitroGLYCERIN 0.4 MG SL tablet Commonly known as: NITROSTAT Place 0.4 mg under the tongue every 5 (five) minutes as needed for chest pain.   pantoprazole 40 MG tablet Commonly known as: PROTONIX Take 1 tablet (40 mg total) by mouth 2 (two) times daily.   polyethylene glycol 17 g packet Commonly known as: MIRALAX / GLYCOLAX Take 17 g by mouth daily as needed. What changed: reasons to take  this   PROBIOTIC PO Take 1 capsule by mouth daily.   promethazine  12.5 MG tablet Commonly known as: PHENERGAN Take 1 tablet (12.5 mg total) by mouth every 6 (six) hours as needed for nausea or vomiting.   sevelamer carbonate 800 MG tablet Commonly known as: RENVELA Take 800 mg by mouth 4 (four) times daily. (Take with meals and snack)   Vitamin D3 25 MCG (1000 UT) Caps Take 1,000 Units by mouth daily.        Allergies:  Allergies  Allergen Reactions   Ace Inhibitors Swelling and Anaphylaxis   Ativan [Lorazepam] Other (See Comments)    Reaction:  Hallucinations and headaches   Compazine [Prochlorperazine Edisylate] Anaphylaxis, Nausea And Vomiting and Other (See Comments)    Other reaction(s): dystonia from this vs. Reglan, 23 Jul - patient relates that she takes promethazine frequently with no problems   Sumatriptan Succinate Other (See Comments)    Other reaction(s): delirium and hallucinations per [facility] records   Zofran [Ondansetron] Nausea And Vomiting    Per pt. she is allergic to zofran or will experience adverse reaction like hallucination    Losartan Nausea Only   Prochlorperazine Other (See Comments)    Reaction:  Unknown . Patient does not remember reaction but she does have vertigo and anxiety along with n and v at times. Could be used to treat any of these    Reglan [Metoclopramide] Other (See Comments)    Per patient her Dr. Evelina Bucy her off it    Scopolamine Other (See Comments)    Dizziness, also has vertigo already   Tape Rash    Plastic tape causes rash   Tapentadol Rash   Ultrasound Gel Itching    Patient states the Ultrasound gel caused itching while in skin contact and resolved when wiped away.    Family History: Family History  Problem Relation Age of Onset   Kidney disease Mother    Diabetes Mother    Cancer Father    Kidney disease Sister     Social History:  reports that she has never smoked. She has never used smokeless tobacco. She reports  that she does not currently use alcohol. She reports current drug use. Drug: Marijuana.   Physical Exam: There were no vitals taken for this visit.  Constitutional:  Alert and oriented, No acute distress. HEENT: [place] AT, moist mucus membranes.  Trachea midline, no masses. Cardiovascular: No clubbing, cyanosis, or edema. Respiratory: Normal respiratory effort, no increased work of breathing. Skin: No rashes, bruises or suspicious lesions. Neurologic: Grossly intact, no focal deficits, moving all 4 extremities. Psychiatric: Normal mood and affect.  Laboratory Data:  Lab Results  Component Value Date   CREATININE 4.32 (H) 12/03/2021   Lab Results  Component Value Date   HGBA1C 6.2 (H) 11/15/2021    Urinalysis   Pertinent Imaging:    Assessment & Plan:     No follow-ups on file.  I,Kailey Littlejohn,acting as a Education administrator for Hollice Espy, MD.,have documented all relevant documentation on the behalf of Hollice Espy, MD,as directed by  Hollice Espy, MD while in the presence of Hollice Espy, [place] [address], [place], [place] [address] [phone]

## 2022-02-07 NOTE — Telephone Encounter (Signed)
Per Dr Erlene Quan, if pt reschedules, end of day or double book appt, history of no shows or cancellations.

## 2022-02-12 ENCOUNTER — Other Ambulatory Visit: Payer: Self-pay | Admitting: Family Medicine

## 2022-02-12 DIAGNOSIS — Z1231 Encounter for screening mammogram for malignant neoplasm of breast: Secondary | ICD-10-CM

## 2022-03-03 ENCOUNTER — Ambulatory Visit
Admission: RE | Admit: 2022-03-03 | Discharge: 2022-03-03 | Disposition: A | Discharge status: Home / Self Care | Payer: Medicare Other | Source: Ambulatory Visit | Attending: Family Medicine | Admitting: Family Medicine

## 2022-03-03 DIAGNOSIS — Z1231 Encounter for screening mammogram for malignant neoplasm of breast: Secondary | ICD-10-CM | POA: Insufficient documentation

## 2022-03-05 ENCOUNTER — Other Ambulatory Visit: Payer: Self-pay | Admitting: *Deleted

## 2022-03-05 ENCOUNTER — Inpatient Hospital Stay
Admission: RE | Admit: 2022-03-05 | Discharge: 2022-03-05 | Disposition: A | Discharge status: Home / Self Care | Payer: Self-pay | Source: Ambulatory Visit | Attending: *Deleted | Admitting: *Deleted

## 2022-03-05 DIAGNOSIS — Z1231 Encounter for screening mammogram for malignant neoplasm of breast: Secondary | ICD-10-CM

## 2022-03-17 ENCOUNTER — Ambulatory Visit (INDEPENDENT_AMBULATORY_CARE_PROVIDER_SITE_OTHER): Payer: Medicare Other | Admitting: Vascular Surgery

## 2022-03-17 NOTE — Progress Notes (Deleted)
MRN : [id number]  [name] is a 61 y.o. (07-Jun-1961) female who presents with chief complaint of legs swell.  History of Present Illness:    The patient is seen for evaluation of dialysis access.  The patient has a history of multiple failed accesses.    Recent venography showed:  There is extensive occlusion of the majority of named veins in the left neck and arm.  Although her innominate vein is patent I do not see how I would be able to get access to this portion and therefore do not see a option for a left-sided hero graft.  She has a catheter on the right and therefore a hero graft is technically feasible but I do not see how to avoid recurrence of her steal syndrome.  At this point I would advocate for continued catheter-based access    Current access is via a catheter which is functioning poorly the flow rates have been less than ideal.  There have not been multiple episodes of catheter infection.  The patient denies fever and chills while on dialysis.  No tenderness or drainage at the exit site.  No recent shortening of the patient's walking distance or new symptoms consistent with claudication.  No history of rest pain symptoms. No new ulcers or wounds of the lower extremities have occurred.  The patient denies amaurosis fugax or recent TIA symptoms. There are no recent neurological changes noted. There is no history of DVT, PE or superficial thrombophlebitis. No recent episodes of angina or shortness of breath documented.    No outpatient medications have been marked as taking for the 03/17/22 encounter (Appointment) with Delana Meyer, Dolores Lory, MD.    Past Medical History:  Diagnosis Date   Anemia    Anginal pain ([place])    Anxiety    Arthritis    Asthma    Broken wrist    Bronchitis    chronic diastolic CHF 4/40/3474   Chronic kidney disease    COPD (chronic obstructive pulmonary disease) ([place])    Coronary artery disease    a. cath 2013: stenting to RCA (report not  available); b. cath 2014: LM nl, pLAD 40%, mLAD nl, ost LCx 40%, mid LCx nl, pRCA 30% @ site of prior stent, mRCA 50%   Depression    Diabetes mellitus ([place])    Diabetes mellitus without complication ([place])    Diabetic neuropathy ([place])    dialysis 2006   Diverticulosis    Dizziness    Dyspnea    Elevated lipids    Environmental and seasonal allergies    ESRD (end stage renal disease) on dialysis ([place])    M-W-F   Gastroparesis    GERD (gastroesophageal reflux disease)    Headache    History of anemia due to chronic kidney disease    History of hiatal hernia    HOH (hard of hearing)    Hx of pancreatitis 2015   Hypertension    Lower extremity edema    Mitral regurgitation    a. echo 10/2013: EF 62%, noWMA, mildly dilated LA, mild to mod MR/TR, GR1DD   Myocardial infarction ([place])    Orthopnea    Parathyroid abnormality ([place])    Peripheral arterial disease ([place])    Pneumonia    Renal cancer ([place])    Renal insufficiency    Pt is on dialysis on M,W + F.   Wheezing     Past Surgical History:  Procedure Laterality Date  A/V SHUNTOGRAM Left 01/20/2018   Procedure: A/V SHUNTOGRAM;  Surgeon: Algernon Huxley, MD;  Location: [facility];  Service: Cardiovascular;  Laterality: Left;   ABDOMINAL HYSTERECTOMY  1992   AMPUTATION TOE Left 10/02/2017   Procedure: AMPUTATION TOE-LEFT GREAT TOE;  Surgeon: Albertine Patricia, DPM;  Location: [facility];  Service: Podiatry;  Laterality: Left;   APPENDECTOMY     APPLICATION OF WOUND VAC N/A 11/25/2019   Procedure: APPLICATION OF WOUND VAC;  Surgeon: Katha Cabal, MD;  Location: [facility];  Service: Vascular;  Laterality: N/A;   ARTERY BIOPSY Right 10/11/2018   Procedure: BIOPSY TEMPORAL ARTERY;  Surgeon: Vickie Epley, MD;  Location: [facility];  Service: General;  Laterality: Right;   BREAST CYST EXCISION     CARDIAC CATHETERIZATION Left 07/26/2015   Procedure: Left Heart Cath and Coronary Angiography;  Surgeon: Dionisio David, MD;   Location: [facility];  Service: Cardiovascular;  Laterality: Left;   CATARACT EXTRACTION W/ INTRAOCULAR LENS IMPLANT Right    CATARACT EXTRACTION W/PHACO Left 03/10/2017   Procedure: CATARACT EXTRACTION PHACO AND INTRAOCULAR LENS PLACEMENT (IOC);  Surgeon: Birder Robson, MD;  Location: [facility];  Service: Ophthalmology;  Laterality: Left;  [place] 00:51.9 AP% 14.2 CDE 7.39 fluid pack lot # [id number]   CENTRAL LINE INSERTION Right 11/11/2019   Procedure: CENTRAL LINE INSERTION;  Surgeon: Katha Cabal, MD;  Location: [facility];  Service: Vascular;  Laterality: Right;   CENTRAL LINE INSERTION  11/25/2019   Procedure: CENTRAL LINE INSERTION;  Surgeon: Katha Cabal, MD;  Location: [facility];  Service: Vascular;;   CHOLECYSTECTOMY     COLONOSCOPY WITH PROPOFOL N/A 08/12/2016   Procedure: COLONOSCOPY WITH PROPOFOL;  Surgeon: Lollie Sails, MD;  Location: [facility];  Service: Endoscopy;  Laterality: N/A;   DIALYSIS FISTULA CREATION Left    upper arm   dialysis grafts     DIALYSIS/PERMA CATHETER INSERTION N/A 11/14/2019   Procedure: DIALYSIS/PERMA CATHETER INSERTION;  Surgeon: Algernon Huxley, MD;  Location: [facility];  Service: Cardiovascular;  Laterality: N/A;   DIALYSIS/PERMA CATHETER INSERTION N/A 02/03/2020   Procedure: DIALYSIS/PERMA CATHETER INSERTION;  Surgeon: Katha Cabal, MD;  Location: [facility];  Service: Cardiovascular;  Laterality: N/A;   DIALYSIS/PERMA CATHETER INSERTION N/A 06/19/2020   Procedure: DIALYSIS/PERMA CATHETER INSERTION;  Surgeon: Katha Cabal, MD;  Location: [facility];  Service: Cardiovascular;  Laterality: N/A;   DIALYSIS/PERMA CATHETER INSERTION N/A 05/07/2021   Procedure: DIALYSIS/PERMA CATHETER INSERTION;  Surgeon: Katha Cabal, MD;  Location: [facility];  Service: Cardiovascular;  Laterality: N/A;  Patient needs general anethesia   DIALYSIS/PERMA CATHETER REMOVAL N/A 05/25/2020    Procedure: DIALYSIS/PERMA CATHETER REMOVAL;  Surgeon: Katha Cabal, MD;  Location: [facility];  Service: Cardiovascular;  Laterality: N/A;   ESOPHAGOGASTRODUODENOSCOPY N/A 03/08/2015   Procedure: ESOPHAGOGASTRODUODENOSCOPY (EGD);  Surgeon: Manya Silvas, MD;  Location: [facility];  Service: Endoscopy;  Laterality: N/A;   ESOPHAGOGASTRODUODENOSCOPY (EGD) WITH PROPOFOL N/A 03/18/2016   Procedure: ESOPHAGOGASTRODUODENOSCOPY (EGD) WITH PROPOFOL;  Surgeon: [name], MD;  Location: [facility];  Service: Endoscopy;  Laterality: N/A;   EYE SURGERY Right 2018   FECAL TRANSPLANT N/A 08/23/2015   Procedure: FECAL TRANSPLANT;  Surgeon: Manya Silvas, MD;  Location: [facility] ENDOSCOPY;  Service: Endoscopy;  Laterality: N/A;   HAND SURGERY Bilateral    HEMATOMA EVACUATION Left 11/25/2019   Procedure: EVACUATION HEMATOMA;  Surgeon: Katha Cabal, MD;  Location: [facility];  Service: Vascular;  Laterality: Left;   I & D EXTREMITY Left 11/25/2019   Procedure: IRRIGATION AND DEBRIDEMENT EXTREMITY;  Surgeon: Katha Cabal, MD;  Location: [facility];  Service: Vascular;  Laterality: Left;   IR FLUORO GUIDE CV LINE RIGHT  04/06/2020   IR INJECT/THERA/INC NEEDLE/CATH/PLC EPI/CERV/THOR W/IMG  08/13/2020   IR RADIOLOGIST EVAL & MGMT  07/28/2019   IR RADIOLOGIST EVAL & MGMT  08/11/2019   LIGATION OF ARTERIOVENOUS  FISTULA Left 11/11/2019   LIGATION OF ARTERIOVENOUS  FISTULA Left 11/11/2019   Procedure: LIGATION OF ARTERIOVENOUS  FISTULA;  Surgeon: Katha Cabal, MD;  Location: [facility];  Service: Vascular;  Laterality: Left;   LIGATIONS OF HERO GRAFT Right 06/13/2020   Procedure: LIGATION / REMOVAL OF RIGHT HERO GRAFT;  Surgeon: Katha Cabal, MD;  Location: [facility];  Service: Vascular;  Laterality: Right;   PERIPHERAL VASCULAR CATHETERIZATION N/A 12/20/2015   Procedure: Thrombectomy of dialysis access versus permcath placement;  Surgeon: Algernon Huxley, MD;  Location: [facility] [facility];  Service: Cardiovascular;  Laterality: N/A;   PERIPHERAL VASCULAR CATHETERIZATION N/A 12/20/2015   Procedure: A/V Shunt Intervention;  Surgeon: Algernon Huxley, MD;  Location: [facility];  Service: Cardiovascular;  Laterality: N/A;   PERIPHERAL VASCULAR CATHETERIZATION N/A 12/20/2015   Procedure: A/V Shuntogram/Fistulagram;  Surgeon: Algernon Huxley, MD;  Location: [place];  Service: Cardiovascular;  Laterality: N/A;   PERIPHERAL VASCULAR CATHETERIZATION N/A 01/02/2016   Procedure: A/V Shuntogram/Fistulagram;  Surgeon: Algernon Huxley, MD;  Location: [facility];  Service: Cardiovascular;  Laterality: N/A;   PERIPHERAL VASCULAR CATHETERIZATION N/A 01/02/2016   Procedure: A/V Shunt Intervention;  Surgeon: Algernon Huxley, MD;  Location: [place] [facility];  Service: Cardiovascular;  Laterality: N/A;   TEE WITHOUT CARDIOVERSION N/A 06/11/2020   Procedure: TRANSESOPHAGEAL ECHOCARDIOGRAM (TEE);  Surgeon: Teodoro Spray, MD;  Location: [facility];  Service: Cardiovascular;  Laterality: N/A;   UPPER EXTREMITY ANGIOGRAPHY Bilateral 11/27/2020   Procedure: UPPER EXTREMITY ANGIOGRAPHY;  Surgeon: Katha Cabal, MD;  Location: [place] [facility];  Service: Cardiovascular;  Laterality: Bilateral;   UPPER EXTREMITY VENOGRAPHY Right 01/18/2020   Procedure: UPPER EXTREMITY VENOGRAPHY;  Surgeon: Katha Cabal, MD;  Location: [place] [facility];  Service: Cardiovascular;  Laterality: Right;   UPPER EXTREMITY VENOGRAPHY Bilateral 11/27/2020   Procedure: UPPER EXTREMITY VENOGRAPHY;  Surgeon: Katha Cabal, MD;  Location: [place] [facility];  Service: Cardiovascular;  Laterality: Bilateral;   UPPER EXTREMITY VENOGRAPHY Left 11/05/2021   Procedure: UPPER EXTREMITY VENOGRAPHY;  Surgeon: Katha Cabal, MD;  Location: [place] [facility];  Service: Cardiovascular;  Laterality: Left;   VASCULAR ACCESS DEVICE INSERTION Right 04/13/2020   Procedure: INSERTION OF  HERO VASCULAR ACCESS DEVICE (GRAFT);  Surgeon: Katha Cabal, MD;  Location: [facility];  Service: Vascular;  Laterality: Right;    Social History Social History   Tobacco Use   Smoking status: Never   Smokeless tobacco: Never  Vaping Use   Vaping Use: Never used  Substance Use Topics   Alcohol use: Not Currently    Comment: glass wine week per pt   Drug use: Yes    Types: Marijuana    Comment: once a day    Family History Family History  Problem Relation Age of Onset   Kidney disease Mother    Diabetes Mother    Cancer Father    Kidney disease Sister  Allergies  Allergen Reactions   Ace Inhibitors Swelling and Anaphylaxis   Ativan [Lorazepam] Other (See Comments)    Reaction:  Hallucinations and headaches   Compazine [Prochlorperazine Edisylate] Anaphylaxis, Nausea And Vomiting and Other (See Comments)    Other reaction(s): dystonia from this vs. Reglan, 23 Jul - patient relates that she takes promethazine frequently with no problems   Sumatriptan Succinate Other (See Comments)    Other reaction(s): delirium and hallucinations per [facility] records   Zofran [Ondansetron] Nausea And Vomiting    Per pt. she is allergic to zofran or will experience adverse reaction like hallucination    Losartan Nausea Only   Prochlorperazine Other (See Comments)    Reaction:  Unknown . Patient does not remember reaction but she does have vertigo and anxiety along with n and v at times. Could be used to treat any of these    Reglan [Metoclopramide] Other (See Comments)    Per patient her Dr. Evelina Bucy her off it    Scopolamine Other (See Comments)    Dizziness, also has vertigo already   Tape Rash    Plastic tape causes rash   Tapentadol Rash   Ultrasound Gel Itching    Patient states the Ultrasound gel caused itching while in skin contact and resolved when wiped away.     REVIEW OF SYSTEMS (Negative unless checked)  Constitutional: '[]'$ Weight loss  '[]'$ Fever  '[]'$ Chills Cardiac: '[]'$ Chest  pain   '[]'$ Chest pressure   '[]'$ Palpitations   '[]'$ Shortness of breath when laying flat   '[]'$ Shortness of breath with exertion. Vascular:  '[]'$ Pain in legs with walking   '[x]'$ Pain in legs with standing  '[]'$ History of DVT   '[]'$ Phlebitis   '[x]'$ Swelling in legs   '[]'$ Varicose veins   '[]'$ Non-healing ulcers Pulmonary:   '[]'$ Uses home oxygen   '[]'$ Productive cough   '[]'$ Hemoptysis   '[]'$ Wheeze  '[]'$ COPD   '[]'$ Asthma Neurologic:  '[]'$ Dizziness   '[]'$ Seizures   '[]'$ History of stroke   '[]'$ History of TIA  '[]'$ Aphasia   '[]'$ Vissual changes   '[]'$ Weakness or numbness in arm   '[]'$ Weakness or numbness in leg Musculoskeletal:   '[]'$ Joint swelling   '[]'$ Joint pain   '[]'$ Low back pain Hematologic:  '[]'$ Easy bruising  '[]'$ Easy bleeding   '[]'$ Hypercoagulable state   '[]'$ Anemic Gastrointestinal:  '[]'$ Diarrhea   '[]'$ Vomiting  '[]'$ Gastroesophageal reflux/heartburn   '[]'$ Difficulty swallowing. Genitourinary:  '[]'$ Chronic kidney disease   '[]'$ Difficult urination  '[]'$ Frequent urination   '[]'$ Blood in urine Skin:  '[]'$ Rashes   '[]'$ Ulcers  Psychological:  '[]'$ History of anxiety   '[]'$  History of major depression.  Physical Examination  There were no vitals filed for this visit. There is no height or weight on file to calculate BMI. Gen: WD/WN, NAD Head: [place]/AT, No temporalis wasting.  Ear/Nose/Throat: Hearing grossly intact, nares w/o erythema or drainage, pinna without lesions Eyes: PER, EOMI, sclera nonicteric.  Neck: Supple, no gross masses.  No JVD.  Pulmonary:  Good air movement, no audible wheezing, no use of accessory muscles.  Cardiac: RRR, precordium not hyperdynamic. Vascular:  scattered varicosities present bilaterally.  Mild venous stasis changes to the legs bilaterally.  3-4+ soft pitting edema  Vessel Right Left  Radial Palpable Palpable  Gastrointestinal: soft, non-distended. No guarding/no peritoneal signs.  Musculoskeletal: M/S 5/5 throughout.  No deformity.  Neurologic: CN 2-12 intact. Pain and light touch intact in extremities.  Symmetrical.  Speech is fluent. Motor exam as  listed above. Psychiatric: Judgment intact, Mood & affect appropriate for pt's clinical situation. Dermatologic: Venous rashes no ulcers noted.  No  changes consistent with cellulitis. Lymph : No lichenification or skin changes of chronic lymphedema.  CBC Lab Results  Component Value Date   WBC 4.4 11/29/2021   HGB 12.3 11/29/2021   HCT 39.1 11/29/2021   MCV 88.3 11/29/2021   PLT 282 11/29/2021    BMET    Component Value Date/Time   NA 130 (L) 12/03/2021 0539   NA 135 (L) 09/15/2014 0948   K 3.5 12/03/2021 0539   K 4.8 09/15/2014 0948   CL 92 (L) 12/03/2021 0539   CL 99 09/15/2014 0948   CO2 29 12/03/2021 0539   CO2 26 09/15/2014 0948   GLUCOSE 161 (H) 12/03/2021 0539   GLUCOSE 118 (H) 09/15/2014 0948   BUN 12 12/03/2021 0539   BUN 19 (H) 09/15/2014 0948   CREATININE 4.32 (H) 12/03/2021 0539   CREATININE 6.79 (H) 09/15/2014 0948   CALCIUM 8.2 (L) 12/03/2021 0539   CALCIUM 8.3 (L) 09/15/2014 0948   GFRNONAA 11 (L) 12/03/2021 0539   GFRNONAA 7 (L) 09/15/2014 0948   GFRNONAA 6 (L) 05/31/2014 0432   GFRAA 11 (L) 06/25/2020 0641   GFRAA 8 (L) 09/15/2014 0948   GFRAA 7 (L) 05/31/2014 0432   CrCl cannot be calculated (Patient's most recent lab result is older than the maximum 21 days allowed.).  COAG Lab Results  Component Value Date   INR 1.2 04/25/2021   INR 1.3 (H) 03/29/2021   INR 1.3 (H) 03/17/2021    Radiology MM 3D SCREEN BREAST BILATERAL  Result Date: 03/05/2022 CLINICAL DATA:  Screening. EXAM: DIGITAL SCREENING BILATERAL MAMMOGRAM WITH TOMOSYNTHESIS AND CAD TECHNIQUE: Bilateral screening digital craniocaudal and mediolateral oblique mammograms were obtained. Bilateral screening digital breast tomosynthesis was performed. The images were evaluated with computer-aided detection. COMPARISON:  Previous exam(s). ACR Breast Density Category c: The breast tissue is heterogeneously dense, which may obscure small masses. FINDINGS: There are no findings suspicious for  malignancy. IMPRESSION: No mammographic evidence of malignancy. A result letter of this screening mammogram will be mailed directly to the patient. RECOMMENDATION: Screening mammogram in one year. (Code:SM-B-01Y) BI-RADS CATEGORY  1: Negative. Electronically Signed   By: Marin Olp M.D.   On: 03/05/2022 13:24   MM Outside Films Mammo  Result Date: 03/05/2022 This examination belongs to an outside facility and is stored here for comparison purposes only.  Contact the originating outside institution for any associated report or interpretation.  MM Outside Films Mammo  Result Date: 03/05/2022 This examination belongs to an outside facility and is stored here for comparison purposes only.  Contact the originating outside institution for any associated report or interpretation.  MM Outside Films Mammo  Result Date: 03/05/2022 This examination belongs to an outside facility and is stored here for comparison purposes only.  Contact the originating outside institution for any associated report or interpretation.    Assessment/Plan There are no diagnoses linked to this encounter.   Hortencia Pilar, MD  03/17/2022 8:36 AM

## 2022-03-23 IMAGING — CR DG CHEST 2V
2 series · 2 of 2 positions shown · non-contrast
Comparison: 06/04/2021

CLINICAL DATA: Central chest pain worsened with inspiration, did
not start dialysis today

EXAM:
CHEST - 2 VIEW

[chest pa]
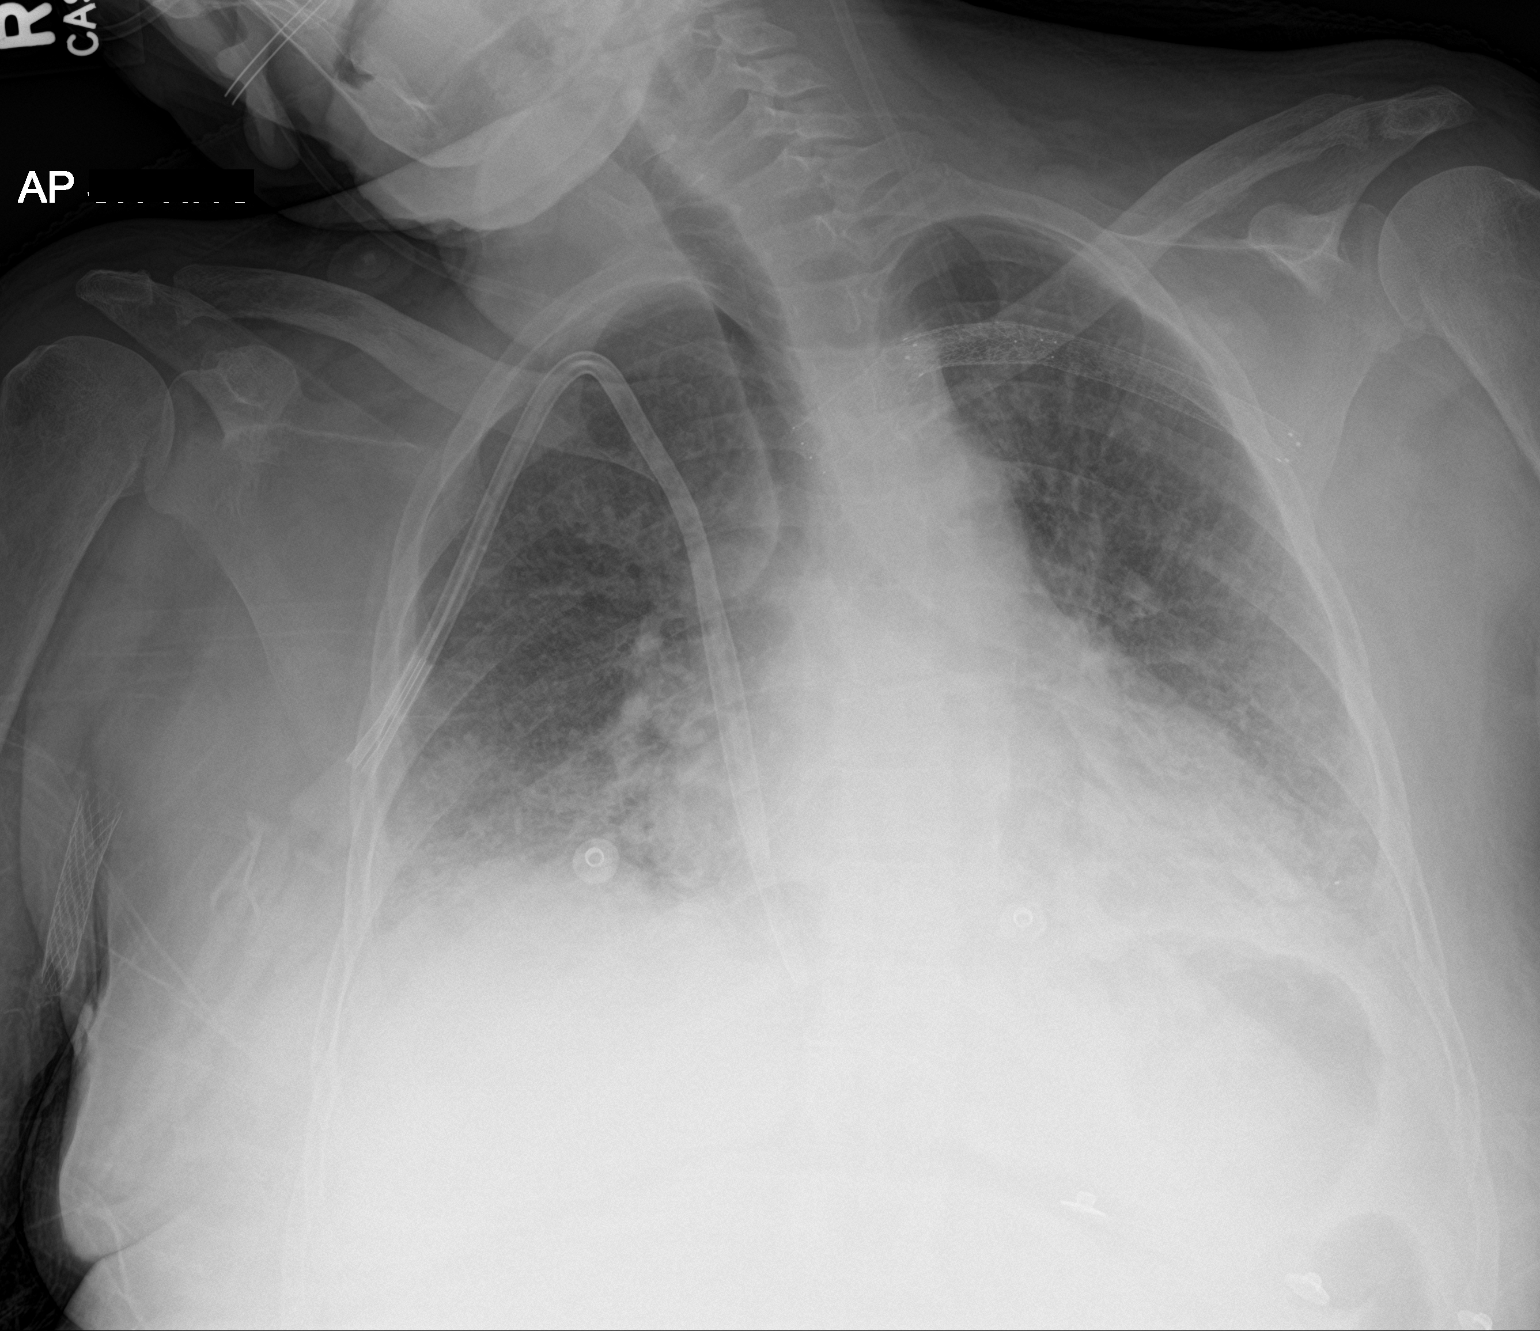

[chest lat]
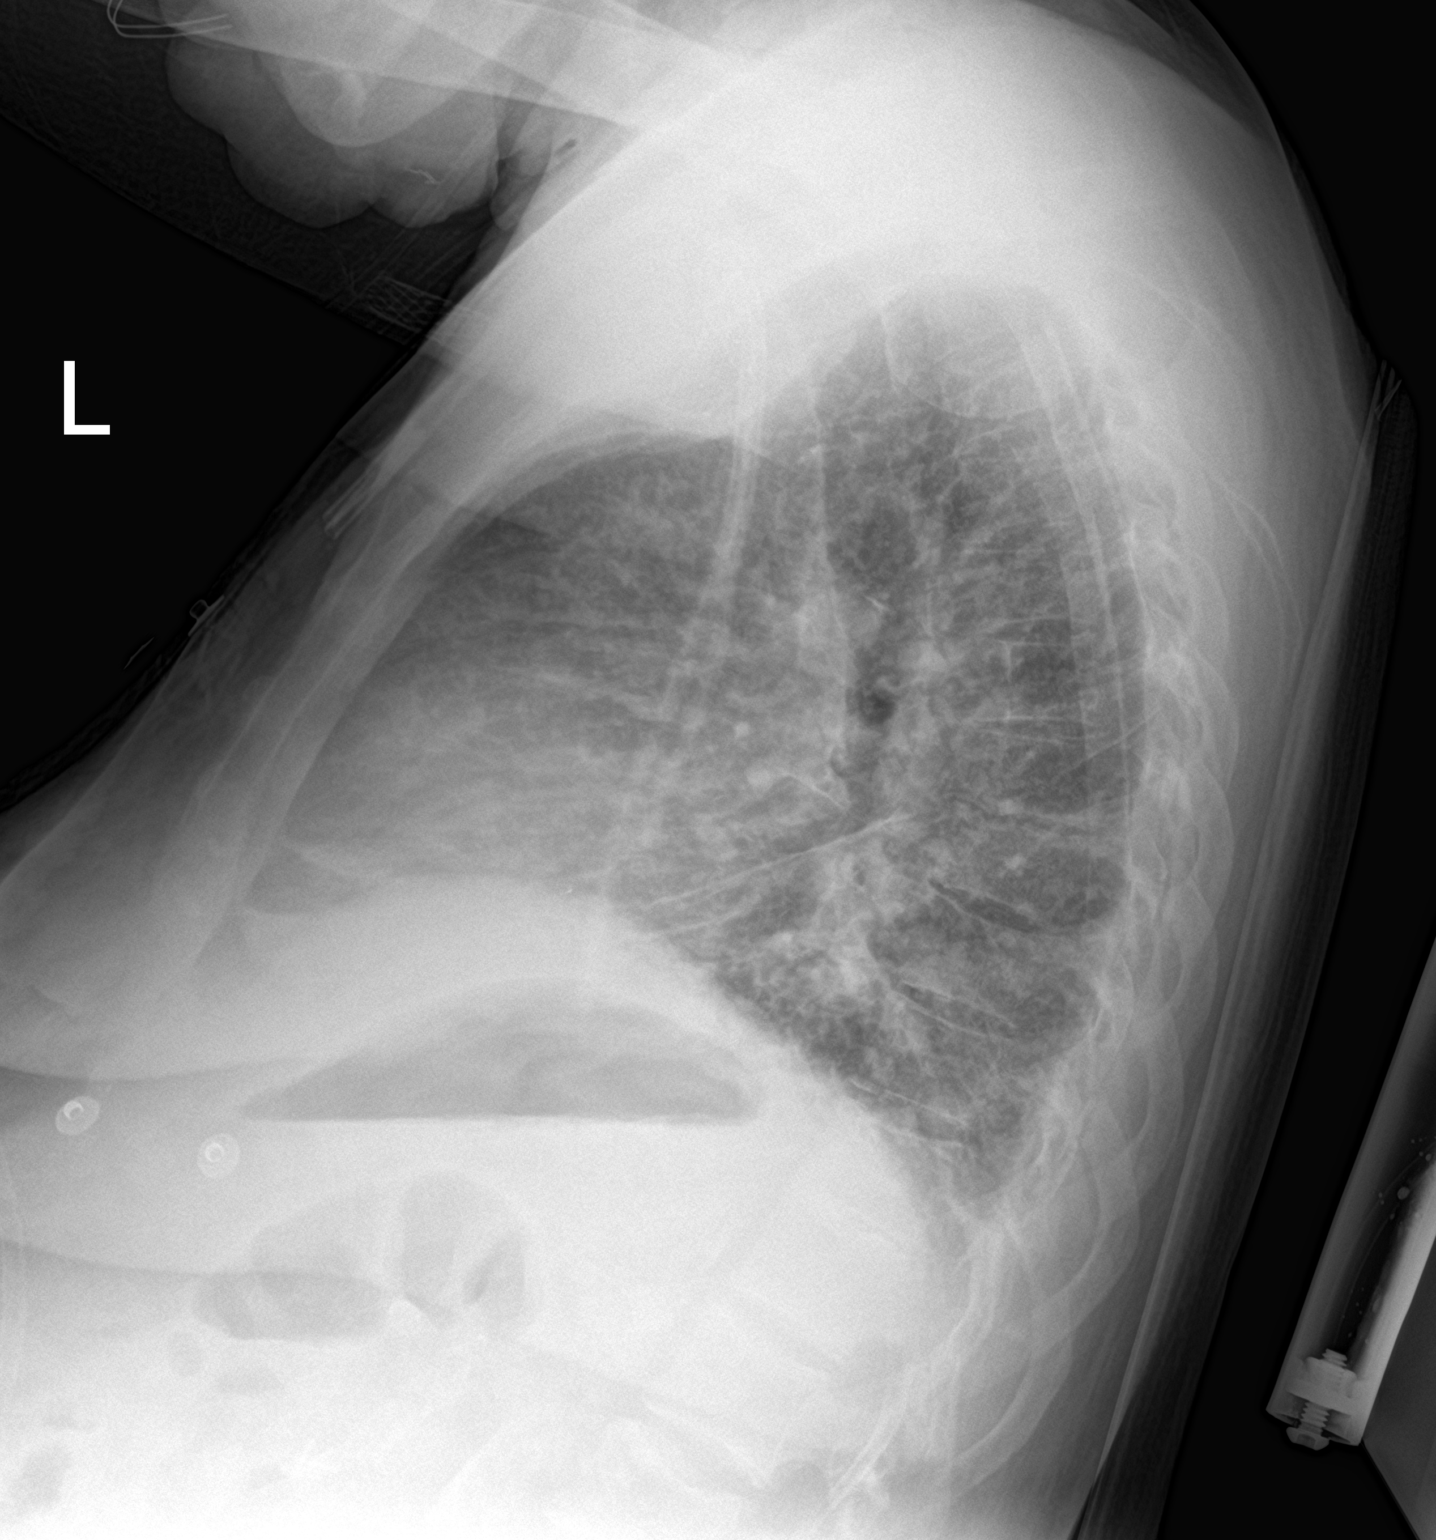

[2 of 2 positions shown; findings below may reference images not displayed]

FINDINGS: RIGHT jugular dual-lumen central venous catheter with tip projecting
over inferior RIGHT atrium near cavoatrial junction.

Multiple vascular stents LEFT subclavian and LEFT axillary.

Enlargement of cardiac silhouette with pulmonary vascular
congestion.

Diffuse interstitial infiltrates consistent with pulmonary edema,
greater at bases.

No pleural effusion or pneumothorax.
IMPRESSION: Enlargement of cardiac silhouette with pulmonary vascular congestion
and diffuse pulmonary edema.

## 2022-03-23 IMAGING — CT CT ABD-PELV W/ CM
2 of 5 series · 15 of 46 positions shown, 17 images · IV contrast (APPLIED)
Comparison: CT examination dated April 25, 2021

CLINICAL DATA: Abdominal pain

EXAM:
CT ABDOMEN AND PELVIS WITH CONTRAST
TECHNIQUE: Multidetector CT imaging of the abdomen and pelvis was performed
using the standard protocol following bolus administration of
intravenous contrast.

[Series 2: routine abd/pel with · axial · 0.82mm/px · z∈[-483,-53]mm · 12 of 96 slices shown, 14 images]
[im 5/96  soft-tissue]
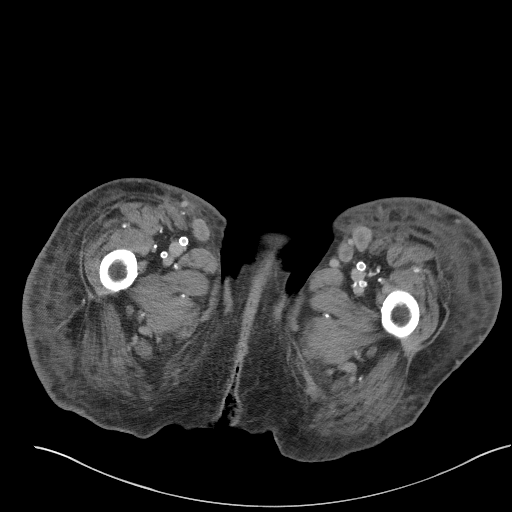
[im 5/96  bone]
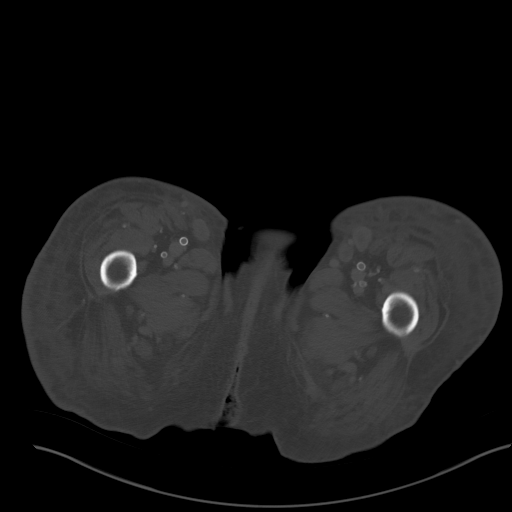
[im 15/96  soft-tissue]
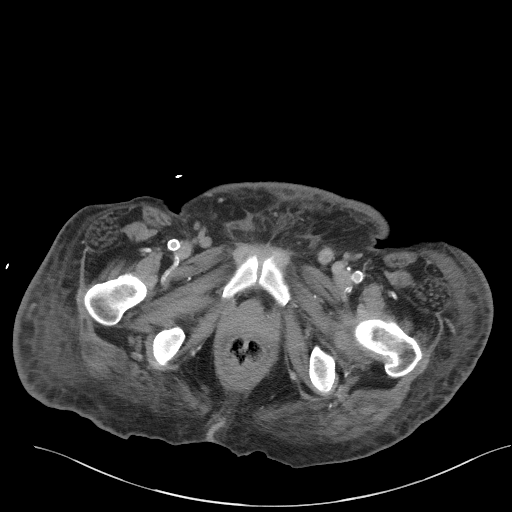
[im 20/96  soft-tissue]
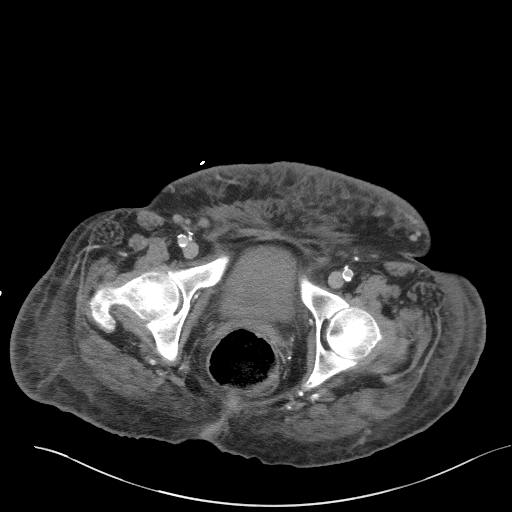
[im 29/96  soft-tissue]
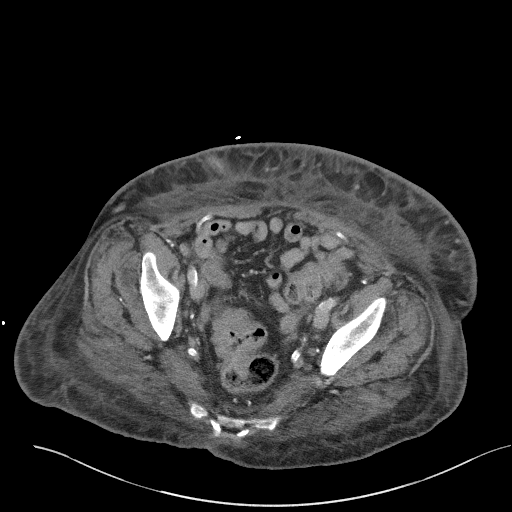
[im 39/96  soft-tissue]
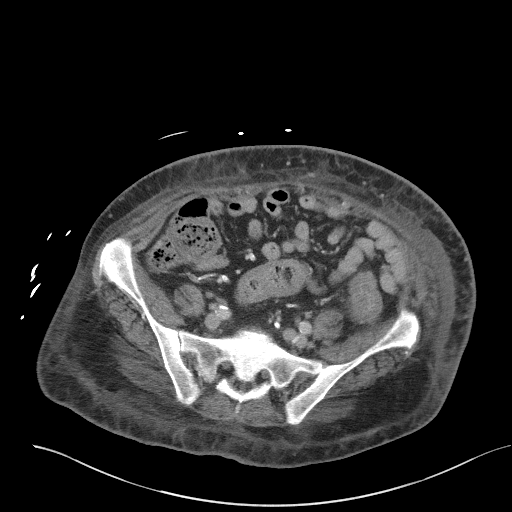
[im 43/96  soft-tissue]
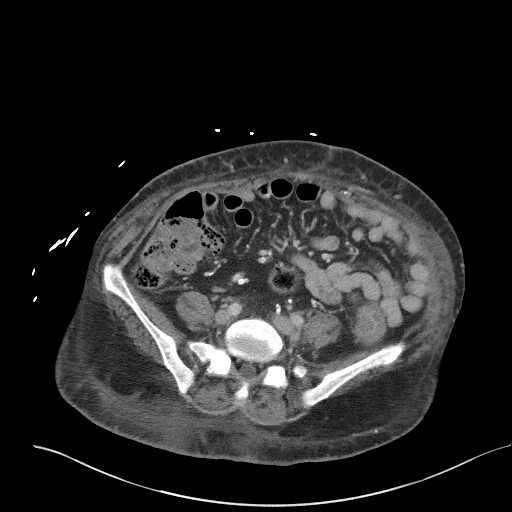
[im 53/96  soft-tissue]
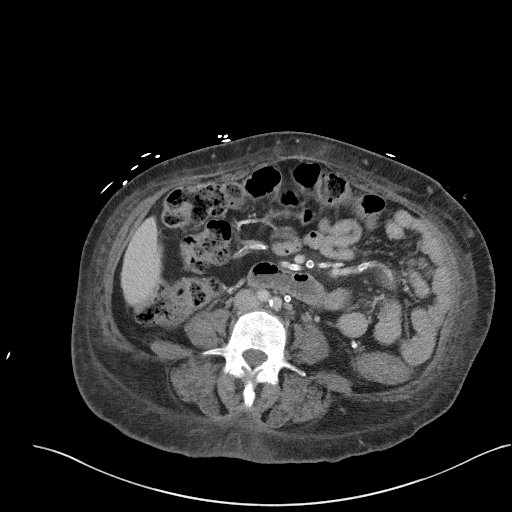
[im 58/96  soft-tissue]
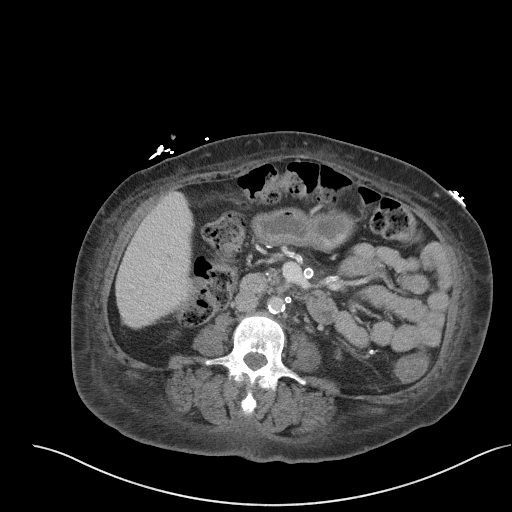
[im 67/96  soft-tissue]
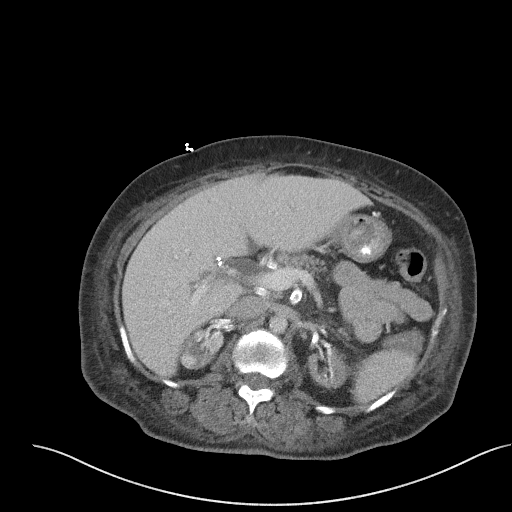
[im 67/96  bone]
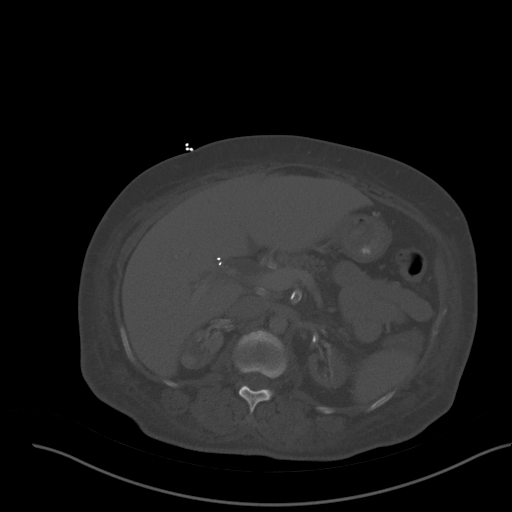
[im 77/96  soft-tissue]
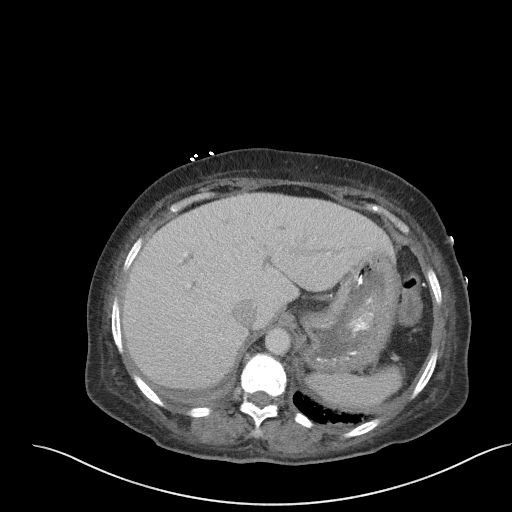
[im 81/96  soft-tissue]
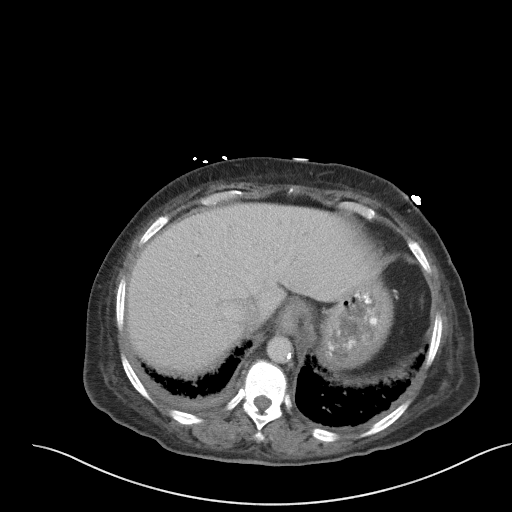
[im 91/96  soft-tissue]
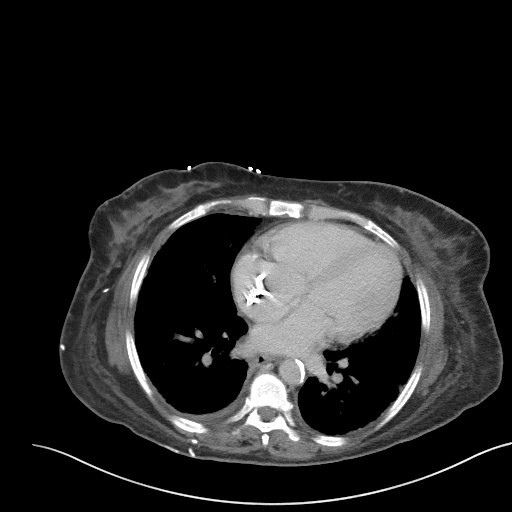

[Series 5: coronal st · coronal · 0.78mm/px · 3 of 92 slices shown]
[im 31/92  soft-tissue]
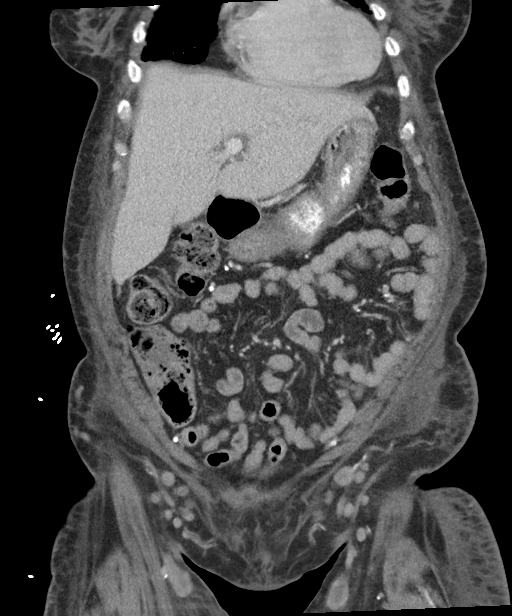
[im 41/92  soft-tissue]
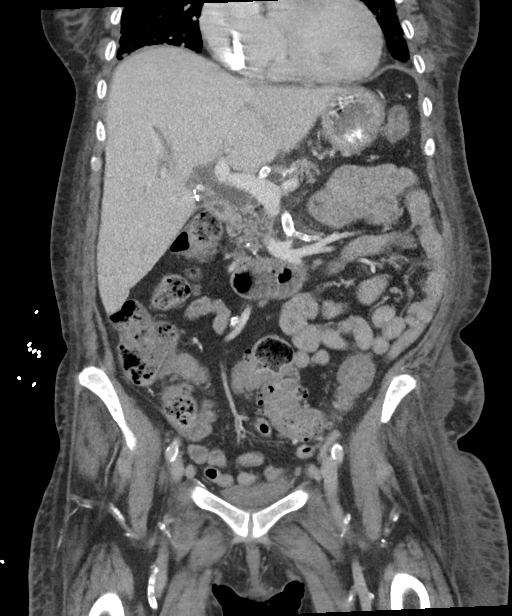
[im 51/92  soft-tissue]
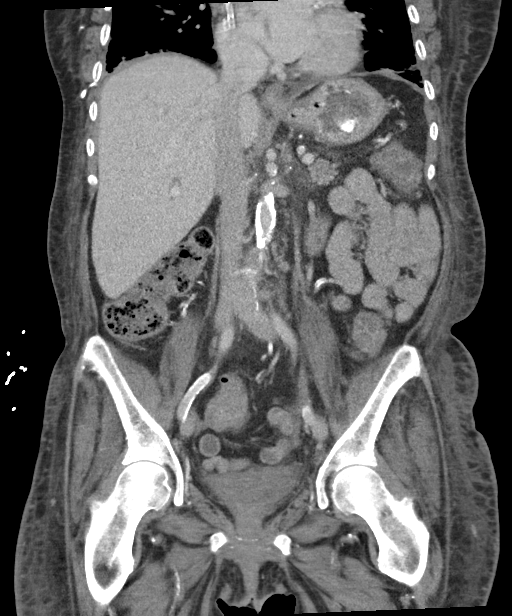

[15 of 46 positions shown; findings below may reference images not displayed]

RADIATION DOSE REDUCTION: This exam was performed according to the
departmental dose-optimization program which includes automated
exposure control, adjustment of the mA and/or kV according to
patient size and/or use of iterative reconstruction technique.

CONTRAST:  75mL OMNIPAQUE IOHEXOL 300 MG/ML  SOLN
FINDINGS: Lower chest: Trace bilateral pleural effusions and mild atelectasis.
Cardiomegaly.

Hepatobiliary: No focal liver abnormality is seen. Status post
cholecystectomy. Prominent extrahepatic common duct, likely
secondary to prior cholecystectomy. Mild prominence of the central
biliary ducts.

Pancreas: Mild pancreatic atrophy. No pancreatic ductal dilatation
or surrounding inflammatory changes.

Spleen: Normal in size without focal abnormality.

Adrenals/Urinary Tract: Adrenal glands are unremarkable. Marked
atrophy of bilateral kidneys. Stable cyst in the interpolar region
of the left kidney. Bladder is unremarkable.

Stomach/Bowel: Small hiatal hernia. There is hyperdense material in
the stomach, which is concerning for acute gastric gastric
hemorrhage. Appendix not clearly visualized.

There is diffuse thickening of the descending/sigmoid colonic wall
with mucosal enhancement concerning for colitis. Sigmoid colonic
diverticuli without evidence of acute diverticulitis.

Vascular/Lymphatic: Advanced atherosclerotic calcification of
abdominal aorta and branch vessels. No enlarged abdominal or pelvic
lymph nodes.

Reproductive: Status post hysterectomy. No adnexal masses.

Other: Marked generalized anasarca with subcutaneous edema in
bilateral extremities and about the abdomen.

Musculoskeletal: Degenerate disc disease of the spine with Schmorl
nodes in L2 and L3. No acute osseous abnormality.
IMPRESSION: 1. Hyperdense material in the stomach, which is suspicious for acute
gastric bleed. Clinical correlation is suggested.

2. Diffuse wall thickening of the descending and sigmoid colon
concerning for colitis.

3.  Chronic atrophy of bilateral kidneys.

4. Marked generalized anasarca with subcutaneous edema about the
abdominal wall and bilateral lower extremities, likely suggesting
volume overload.

5.  Trace bilateral pleural effusions.

6.  Cholecystectomy changes.

7. Marked atherosclerotic disease of abdominal aorta and branch
vessels.

8. Sigmoid colonic diverticulosis without evidence of acute
diverticulitis.

Findings suspicious for acute gastric bleed were reported to Dr.
Anjan Kj at approximately [DATE] p.m. on 11/15/2021.

## 2022-04-04 IMAGING — DX DG CHEST 1V PORT
1 series · 1 of 1 positions shown · non-contrast
Comparison: 11/15/2021

CLINICAL DATA: Cough and fatigue.  Question COVID.

EXAM:
PORTABLE CHEST 1 VIEW

[chest ap]
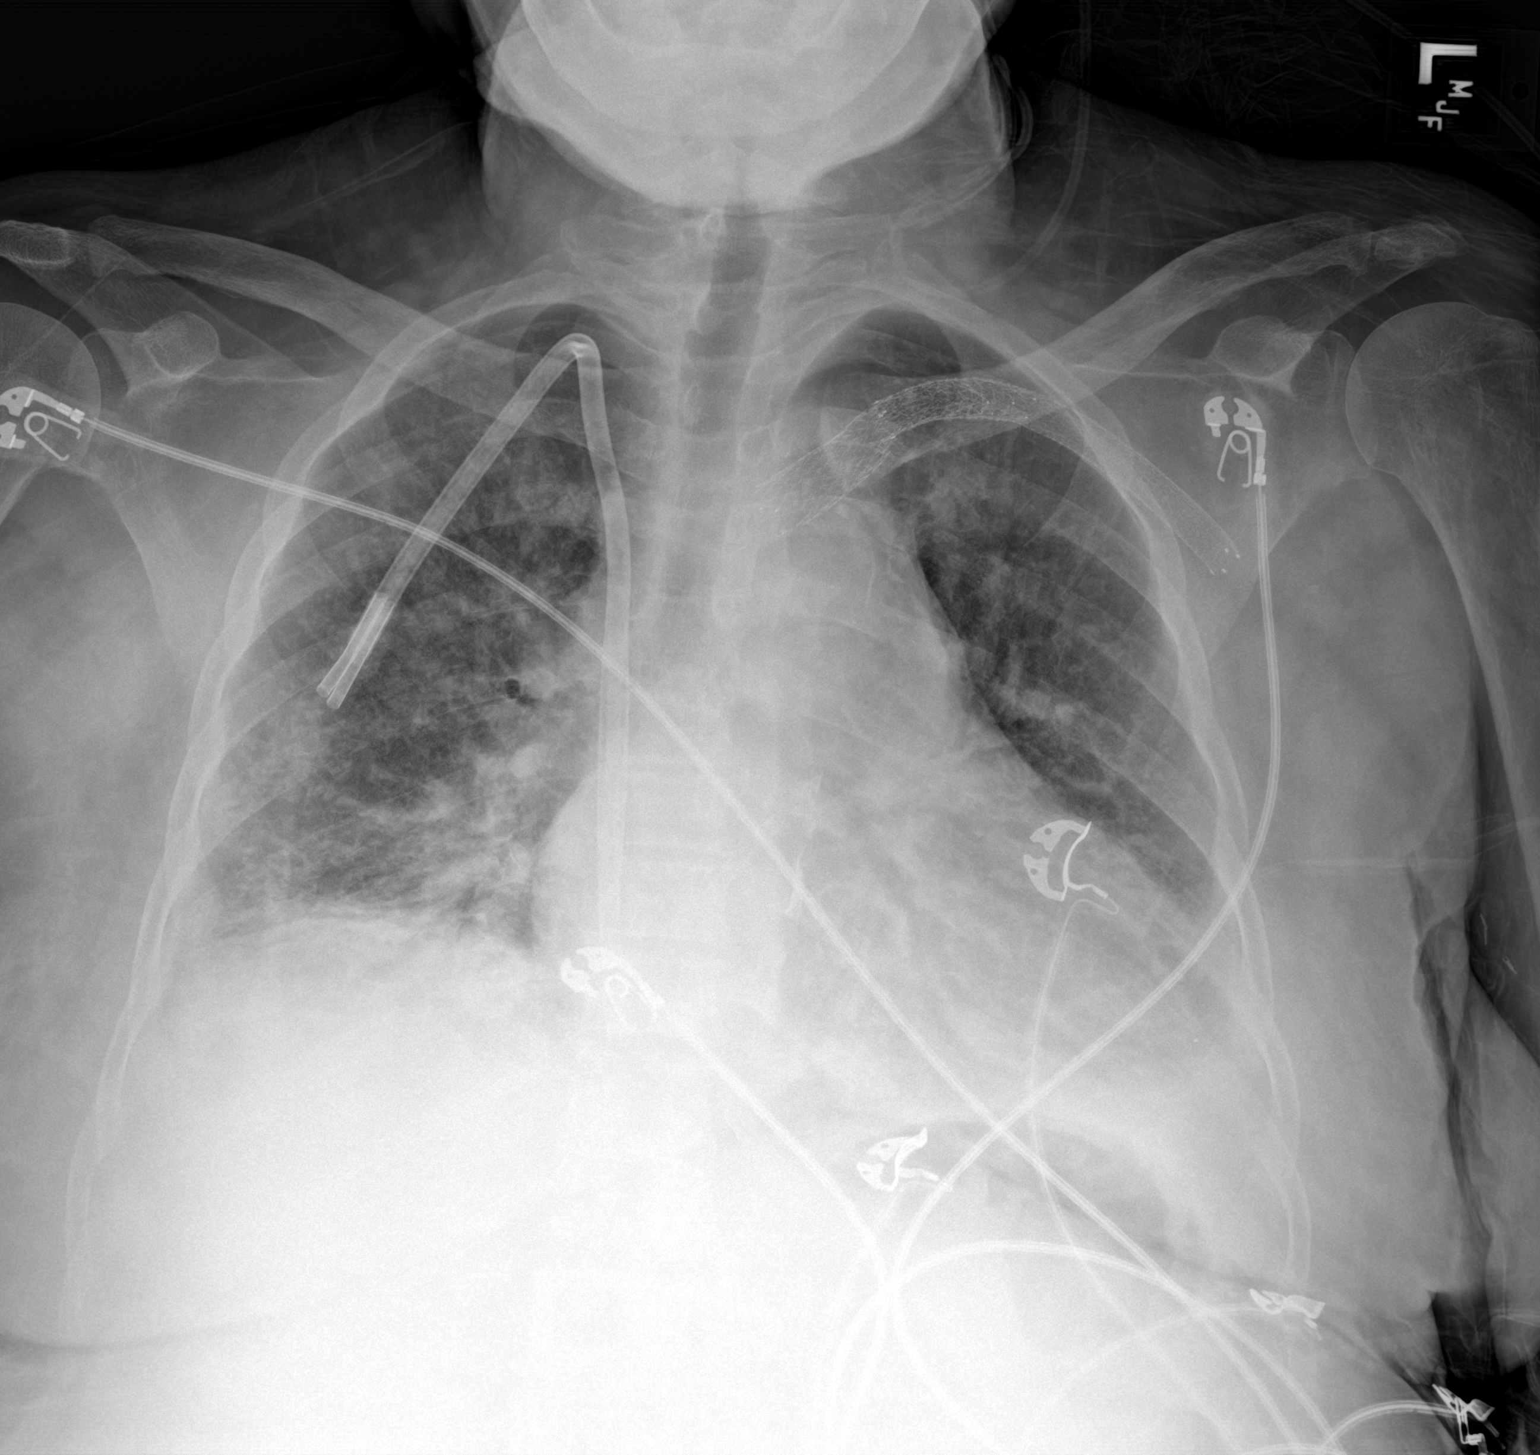

[1 of 1 positions shown; findings below may reference images not displayed]

FINDINGS: Cardiomegaly. Vascular pedicle widening. Left subclavian vein
stenting. Right-sided dialysis catheter with tip at the right
atrium. Diffuse congested appearance of vessels with interstitial
thickening. Small left pleural effusion and hazy density at the
bases similar to prior.
IMPRESSION: Vascular congestion/edema and small left pleural effusion that is
similar to priors. Superimposed pneumonia could easily be obscured.

## 2022-04-10 IMAGING — CT CT CHEST W/O CM
2 of 4 series · 15 of 36 positions shown, 18 images · non-contrast
Comparison: 12/02/2021, 08/10/2020

CLINICAL DATA: COVID for 1 week, cough, chills, weakness, myalgia

EXAM:
CT CHEST WITHOUT CONTRAST
TECHNIQUE: Multidetector CT imaging of the chest was performed following the
standard protocol without IV contrast.
RADIATION DOSE REDUCTION: This exam was performed according to the
departmental dose-optimization program which includes automated
exposure control, adjustment of the mA and/or kV according to
patient size and/or use of iterative reconstruction technique.

[Series 2: lungs · axial · 0.72mm/px · z∈[-527,-259]mm · 12 of 160 slices shown, 15 images]
[im 13/160  mediastinal]
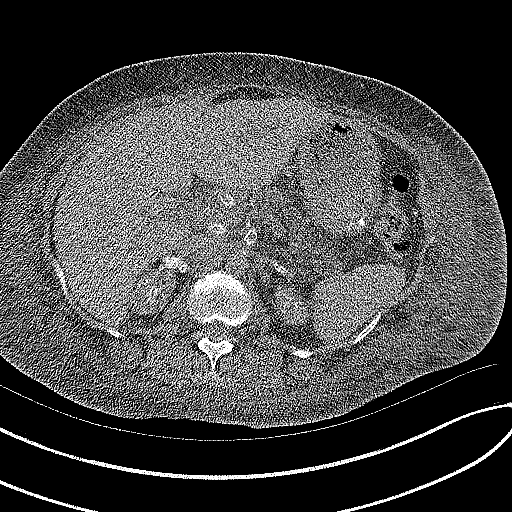
[im 13/160  lung]
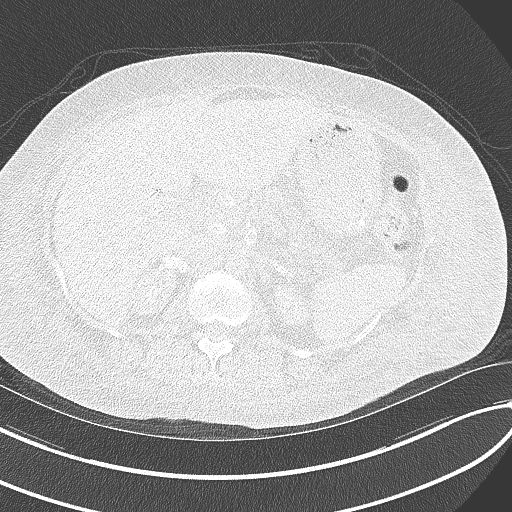
[im 25/160  lung]
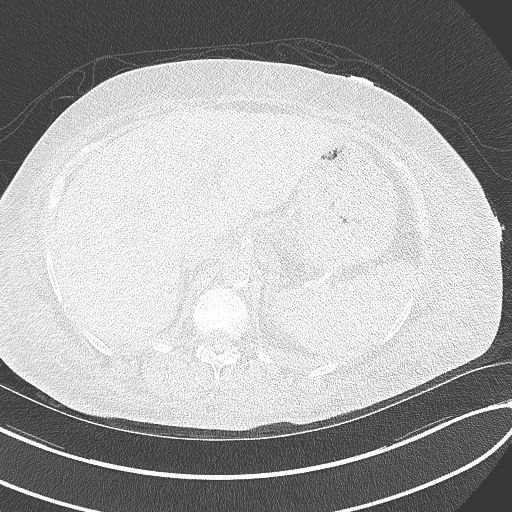
[im 37/160  lung]
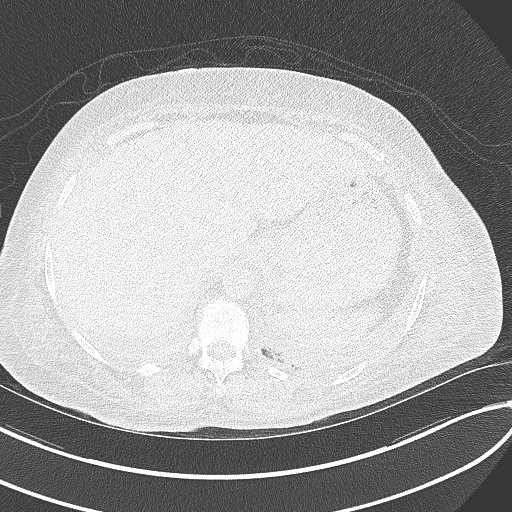
[im 49/160  lung]
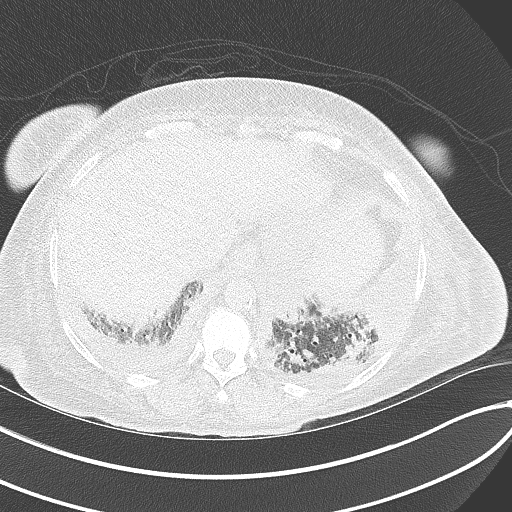
[im 62/160  mediastinal]
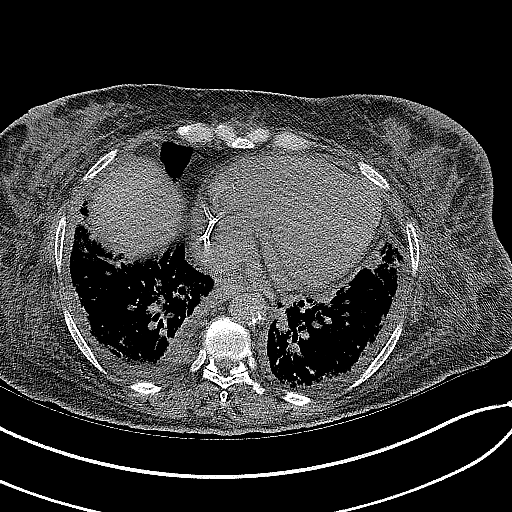
[im 62/160  lung]
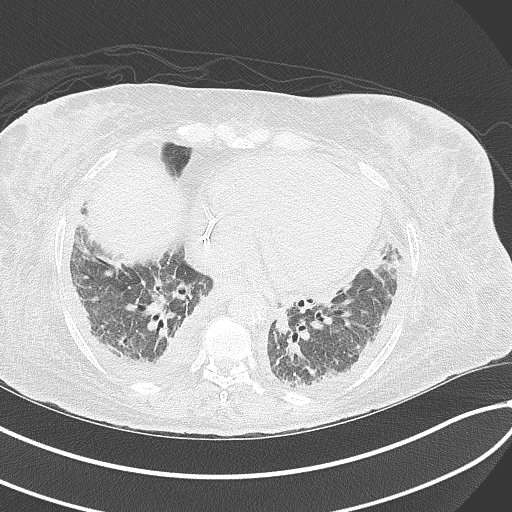
[im 74/160  lung]
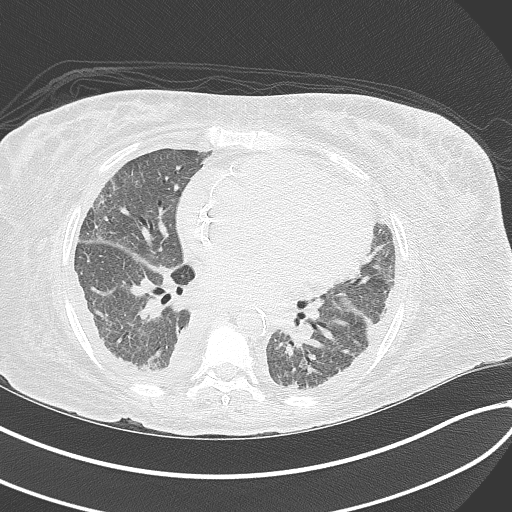
[im 86/160  lung]
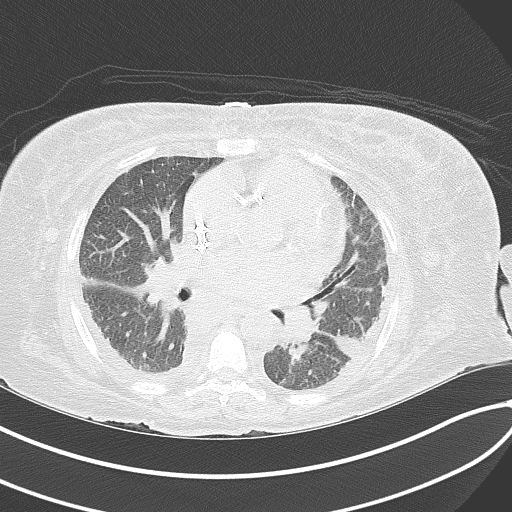
[im 98/160  lung]
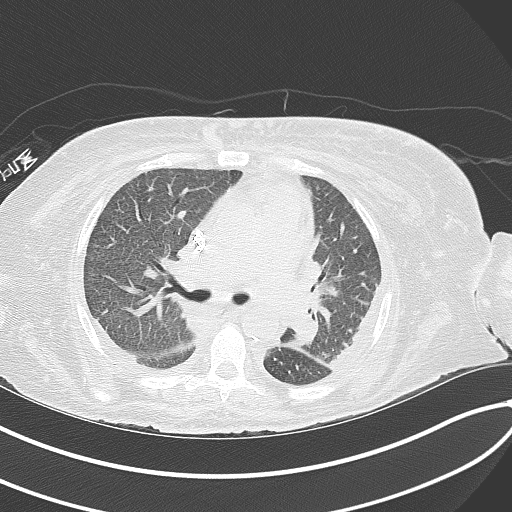
[im 111/160  mediastinal]
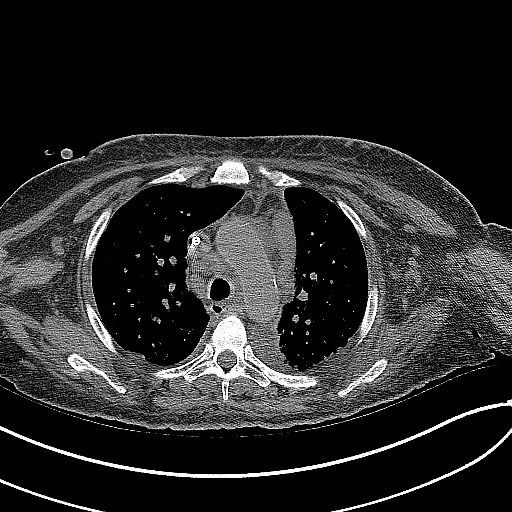
[im 111/160  lung]
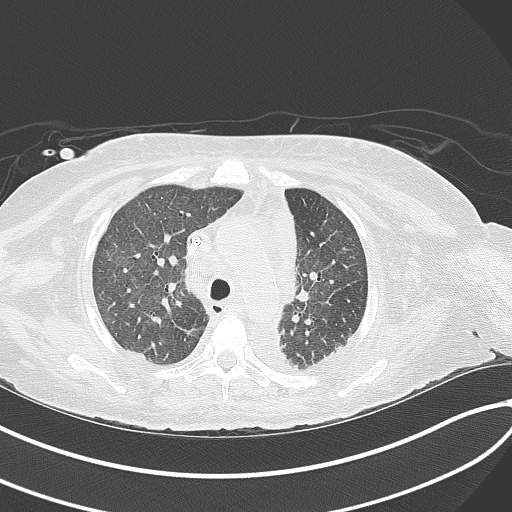
[im 123/160  lung]
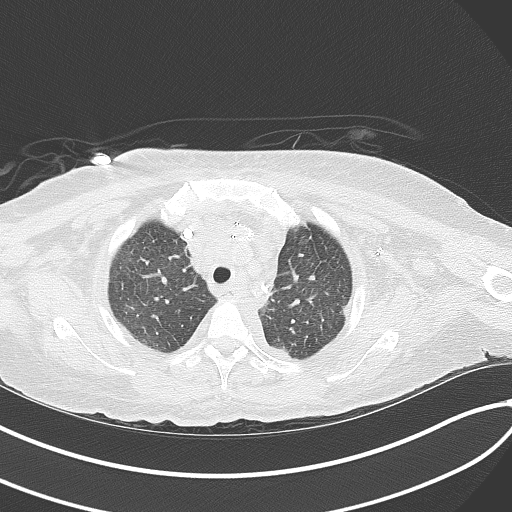
[im 135/160  lung]
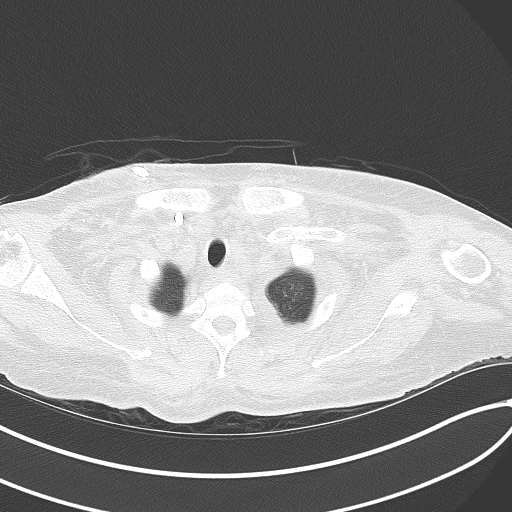
[im 147/160  lung]
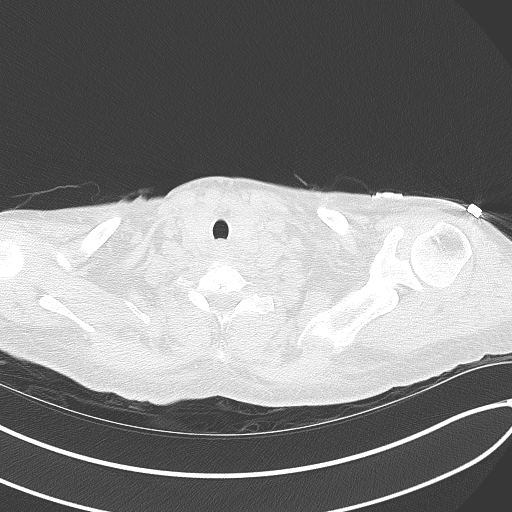

[Series 4: coronal · coronal · 0.64mm/px · 3 of 141 slices shown]
[im 29/141  lung]
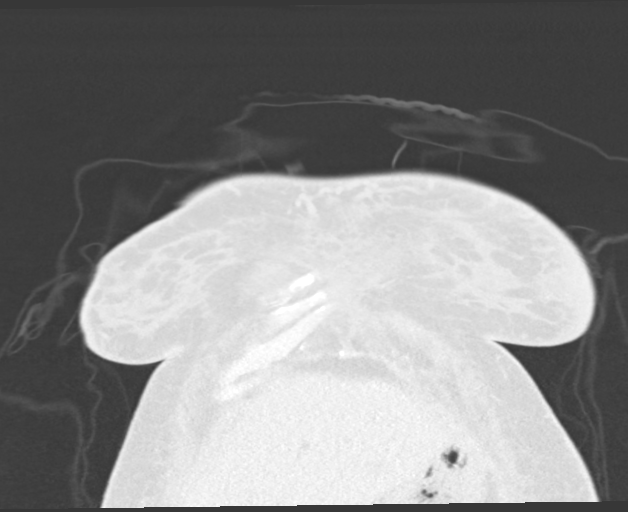
[im 57/141  lung]
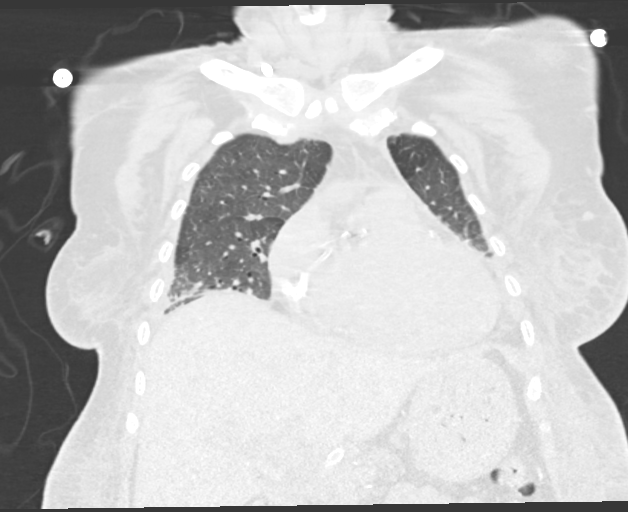
[im 85/141  lung]
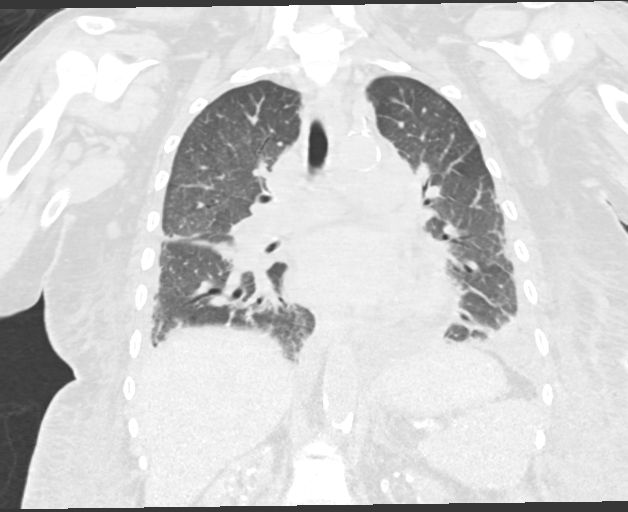

[15 of 36 positions shown; findings below may reference images not displayed]

FINDINGS: Cardiovascular: Unenhanced imaging of the heart demonstrates no
pericardial effusion. Normal caliber of the thoracic aorta. There is
extensive atherosclerosis of the aorta and coronary vasculature.
Right internal jugular dialysis catheter tip at the atriocaval
junction. Left subclavian venous stent.

Mediastinum/Nodes: Diffuse mediastinal adenopathy, largest lymph
node in the AP window measuring 11 mm in short axis. Thyroid,
trachea, and esophagus are grossly unremarkable.

Lungs/Pleura: Trace bilateral pleural effusions. Diffuse
interlobular septal thickening with minimal bibasilar ground-glass
airspace disease, consistent with edema. Hypoventilatory changes
within the lower lobes. No pneumothorax. Central airways are patent.

Upper Abdomen: No acute abnormality.

Musculoskeletal: No acute or destructive bony lesions. Diffuse
changes of renal osteodystrophy.

Diffuse subcutaneous edema. Reconstructed images demonstrate no
additional findings.
IMPRESSION: 1. Interlobular septal thickening, scattered ground-glass airspace
disease, and small bilateral effusions consistent with pulmonary
edema. Underlying COVID pneumonia would be difficult to exclude.
2. Diffuse subcutaneous edema.
3. Diffuse mediastinal lymphadenopathy, nonspecific.
4. Aortic Atherosclerosis ([id number]). Coronary artery
atherosclerosis.

## 2022-04-13 NOTE — Progress Notes (Unsigned)
MRN : [id number]  [name] is a 61 y.o. ([birth year]) female who presents with chief complaint of check access.  History of Present Illness:   The patient is seen for evaluation of dialysis access.  The patient has a history of multiple failed accesses.    Procedure(s) Performed 11/05/2021:             1.  Ultrasound-guided access to antecubital vein left arm             2.  Contrast injection left arm venogram with central imaging             3.  Ultrasound-guided access to the left internal jugular vein             4.  Ultrasound-guided access to left subclavian collateral             5.  Contrast imaging left internal jugular vein             6.  Contrast imaging left subclavian and innominate vein  There is extensive occlusion of the majority of named veins in the left neck and arm.  Although her innominate vein is patent I do not see how I would be able to get access to this portion and therefore do not see a option for a left-sided hero graft.  She has a catheter on the right and therefore a hero graft is technically feasible but I do not see how to avoid recurrence of her steal syndrome.  At this point I would advocate for continued catheter-based access  Current access is via a catheter which is functioning poorly the flow rates have been less than ideal.  There have not been multiple episodes of catheter infection.  The patient denies fever and chills while on dialysis.  No tenderness or drainage at the exit site.  The patient is also followed for leg swelling.  The swelling has not  improved and the pain associated with swelling has increased substantially. There have not been any interval development of a ulcerations or wounds.  Since the previous visit the patient has tried to wear graduated compression stockings.   No recent shortening of the patient's walking distance or new symptoms consistent with claudication.  No history of rest pain symptoms. No  new ulcers or wounds of the lower extremities have occurred.  The patient denies amaurosis fugax or recent TIA symptoms. There are no recent neurological changes noted. There is no history of DVT, PE or superficial thrombophlebitis. No recent episodes of angina or shortness of breath documented.     No outpatient medications have been marked as taking for the 04/14/22 encounter (Appointment) with Delana Meyer, Dolores Lory, MD.    Past Medical History:  Diagnosis Date   Anemia    Anginal pain ([place])    Anxiety    Arthritis    Asthma    Broken wrist    Bronchitis    chronic diastolic CHF 04/03/4579   Chronic kidney disease    COPD (chronic obstructive pulmonary disease) ([place])    Coronary artery disease    a. cath 2013: stenting to RCA (report not available); b. cath 2014: LM nl, pLAD 40%, mLAD nl, ost LCx 40%, mid LCx nl, pRCA 30% @ site of prior stent, mRCA 50%   Depression    Diabetes mellitus ([place])  Diabetes mellitus without complication ([place])    Diabetic neuropathy ([place])    dialysis 2006   Diverticulosis    Dizziness    Dyspnea    Elevated lipids    Environmental and seasonal allergies    ESRD (end stage renal disease) on dialysis [facility])    M-W-F   Gastroparesis    GERD (gastroesophageal reflux disease)    Headache    History of anemia due to chronic kidney disease    History of hiatal hernia    HOH (hard of hearing)    Hx of pancreatitis 2015   Hypertension    Lower extremity edema    Mitral regurgitation    a. echo 10/2013: EF 62%, noWMA, mildly dilated LA, mild to mod MR/TR, GR1DD   Myocardial infarction ([place])    Orthopnea    Parathyroid abnormality ([place])    Peripheral arterial disease ([place])    Pneumonia    Renal cancer ([place])    Renal insufficiency    Pt is on dialysis on M,W + F.   Wheezing     Past Surgical History:  Procedure Laterality Date   A/V SHUNTOGRAM Left 01/20/2018   Procedure: A/V SHUNTOGRAM;  Surgeon: Algernon Huxley, MD;  Location: [place] [facility];  Service: Cardiovascular;  Laterality: Left;   ABDOMINAL HYSTERECTOMY  1992   AMPUTATION TOE Left 10/02/2017   Procedure: AMPUTATION TOE-LEFT GREAT TOE;  Surgeon: Albertine Patricia, DPM;  Location: [facility];  Service: Podiatry;  Laterality: Left;   APPENDECTOMY     APPLICATION OF WOUND VAC N/A 11/25/2019   Procedure: APPLICATION OF WOUND VAC;  Surgeon: Katha Cabal, MD;  Location: [facility];  Service: Vascular;  Laterality: N/A;   ARTERY BIOPSY Right 10/11/2018   Procedure: BIOPSY TEMPORAL ARTERY;  Surgeon: Vickie Epley, MD;  Location: [facility];  Service: General;  Laterality: Right;   BREAST CYST EXCISION     CARDIAC CATHETERIZATION Left 07/26/2015   Procedure: Left Heart Cath and Coronary Angiography;  Surgeon: Dionisio David, MD;  Location: [place] [facility];  Service: Cardiovascular;  Laterality: Left;   CATARACT EXTRACTION W/ INTRAOCULAR LENS IMPLANT Right    CATARACT EXTRACTION W/PHACO Left 03/10/2017   Procedure: CATARACT EXTRACTION PHACO AND INTRAOCULAR LENS PLACEMENT (IOC);  Surgeon: Birder Robson, MD;  Location: [facility];  Service: Ophthalmology;  Laterality: Left;  [place] 00:51.9 AP% 14.2 CDE 7.39 fluid pack lot # [id number]   CENTRAL LINE INSERTION Right 11/11/2019   Procedure: CENTRAL LINE INSERTION;  Surgeon: Katha Cabal, MD;  Location: [facility];  Service: Vascular;  Laterality: Right;   CENTRAL LINE INSERTION  11/25/2019   Procedure: CENTRAL LINE INSERTION;  Surgeon: Katha Cabal, MD;  Location: [facility];  Service: Vascular;;   CHOLECYSTECTOMY     COLONOSCOPY WITH PROPOFOL N/A 08/12/2016   Procedure: COLONOSCOPY WITH PROPOFOL;  Surgeon: Lollie Sails, MD;  Location: [facility] ENDOSCOPY;  Service: Endoscopy;  Laterality: N/A;   DIALYSIS FISTULA CREATION Left    upper arm   dialysis grafts     DIALYSIS/PERMA CATHETER INSERTION N/A 11/14/2019   Procedure: DIALYSIS/PERMA CATHETER INSERTION;  Surgeon: Algernon Huxley, MD;  Location: [place] [facility];   Service: Cardiovascular;  Laterality: N/A;   DIALYSIS/PERMA CATHETER INSERTION N/A 02/03/2020   Procedure: DIALYSIS/PERMA CATHETER INSERTION;  Surgeon: Katha Cabal, MD;  Location: [place] [facility];  Service: Cardiovascular;  Laterality: N/A;   DIALYSIS/PERMA CATHETER INSERTION N/A 06/19/2020   Procedure: DIALYSIS/PERMA CATHETER INSERTION;  Surgeon:  Jera Headings, Dolores Lory, MD;  Location: [place] [facility];  Service: Cardiovascular;  Laterality: N/A;   DIALYSIS/PERMA CATHETER INSERTION N/A 05/07/2021   Procedure: DIALYSIS/PERMA CATHETER INSERTION;  Surgeon: Katha Cabal, MD;  Location: [place] [facility];  Service: Cardiovascular;  Laterality: N/A;  Patient needs general anethesia   DIALYSIS/PERMA CATHETER REMOVAL N/A 05/25/2020   Procedure: DIALYSIS/PERMA CATHETER REMOVAL;  Surgeon: Katha Cabal, MD;  Location: [place] [facility];  Service: Cardiovascular;  Laterality: N/A;   ESOPHAGOGASTRODUODENOSCOPY N/A 03/08/2015   Procedure: ESOPHAGOGASTRODUODENOSCOPY (EGD);  Surgeon: Manya Silvas, MD;  Location: [facility] ENDOSCOPY;  Service: Endoscopy;  Laterality: N/A;   ESOPHAGOGASTRODUODENOSCOPY (EGD) WITH PROPOFOL N/A 03/18/2016   Procedure: ESOPHAGOGASTRODUODENOSCOPY (EGD) WITH PROPOFOL;  Surgeon: [name], MD;  Location: [facility] ENDOSCOPY;  Service: Endoscopy;  Laterality: N/A;   EYE SURGERY Right 2018   FECAL TRANSPLANT N/A 08/23/2015   Procedure: FECAL TRANSPLANT;  Surgeon: Manya Silvas, MD;  Location: [facility]COPY;  Service: Endoscopy;  Laterality: N/A;   HAND SURGERY Bilateral    HEMATOMA EVACUATION Left 11/25/2019   Procedure: EVACUATION HEMATOMA;  Surgeon: Katha Cabal, MD;  Location: [facility];  Service: Vascular;  Laterality: Left;   I & D EXTREMITY Left 11/25/2019   Procedure: IRRIGATION AND DEBRIDEMENT EXTREMITY;  Surgeon: Katha Cabal, MD;  Location: [facility];  Service: Vascular;  Laterality: Left;   IR FLUORO GUIDE CV LINE RIGHT  04/06/2020   IR  INJECT/THERA/INC NEEDLE/CATH/PLC EPI/CERV/THOR W/IMG  08/13/2020   IR RADIOLOGIST EVAL & MGMT  07/28/2019   IR RADIOLOGIST EVAL & MGMT  08/11/2019   LIGATION OF ARTERIOVENOUS  FISTULA Left 11/11/2019   LIGATION OF ARTERIOVENOUS  FISTULA Left 11/11/2019   Procedure: LIGATION OF ARTERIOVENOUS  FISTULA;  Surgeon: Katha Cabal, MD;  Location: [facility];  Service: Vascular;  Laterality: Left;   LIGATIONS OF HERO GRAFT Right 06/13/2020   Procedure: LIGATION / REMOVAL OF RIGHT HERO GRAFT;  Surgeon: Katha Cabal, MD;  Location: [facility];  Service: Vascular;  Laterality: Right;   PERIPHERAL VASCULAR CATHETERIZATION N/A 12/20/2015   Procedure: Thrombectomy of dialysis access versus permcath placement;  Surgeon: Algernon Huxley, MD;  Location: [place] [facility];  Service: Cardiovascular;  Laterality: N/A;   PERIPHERAL VASCULAR CATHETERIZATION N/A 12/20/2015   Procedure: A/V Shunt Intervention;  Surgeon: Algernon Huxley, MD;  Location: [place] [facility];  Service: Cardiovascular;  Laterality: N/A;   PERIPHERAL VASCULAR CATHETERIZATION N/A 12/20/2015   Procedure: A/V Shuntogram/Fistulagram;  Surgeon: Algernon Huxley, MD;  Location: [place] [facility];  Service: Cardiovascular;  Laterality: N/A;   PERIPHERAL VASCULAR CATHETERIZATION N/A 01/02/2016   Procedure: A/V Shuntogram/Fistulagram;  Surgeon: Algernon Huxley, MD;  Location: [place] [facility];  Service: Cardiovascular;  Laterality: N/A;   PERIPHERAL VASCULAR CATHETERIZATION N/A 01/02/2016   Procedure: A/V Shunt Intervention;  Surgeon: Algernon Huxley, MD;  Location: [place] [facility];  Service: Cardiovascular;  Laterality: N/A;   TEE WITHOUT CARDIOVERSION N/A 06/11/2020   Procedure: TRANSESOPHAGEAL ECHOCARDIOGRAM (TEE);  Surgeon: Teodoro Spray, MD;  Location: [facility];  Service: Cardiovascular;  Laterality: N/A;   UPPER EXTREMITY ANGIOGRAPHY Bilateral 11/27/2020   Procedure: UPPER EXTREMITY ANGIOGRAPHY;  Surgeon: Katha Cabal, MD;  Location:  [place] [facility];  Service: Cardiovascular;  Laterality: Bilateral;   UPPER EXTREMITY VENOGRAPHY Right 01/18/2020   Procedure: UPPER EXTREMITY VENOGRAPHY;  Surgeon: Katha Cabal, MD;  Location: [place] [facility];  Service: Cardiovascular;  Laterality: Right;  UPPER EXTREMITY VENOGRAPHY Bilateral 11/27/2020   Procedure: UPPER EXTREMITY VENOGRAPHY;  Surgeon: Katha Cabal, MD;  Location: [place] [facility];  Service: Cardiovascular;  Laterality: Bilateral;   UPPER EXTREMITY VENOGRAPHY Left 11/05/2021   Procedure: UPPER EXTREMITY VENOGRAPHY;  Surgeon: Katha Cabal, MD;  Location: [place] [facility];  Service: Cardiovascular;  Laterality: Left;   VASCULAR ACCESS DEVICE INSERTION Right 04/13/2020   Procedure: INSERTION OF HERO VASCULAR ACCESS DEVICE (GRAFT);  Surgeon: Katha Cabal, MD;  Location: [facility];  Service: Vascular;  Laterality: Right;    Social History Social History   Tobacco Use   Smoking status: Never   Smokeless tobacco: Never  Vaping Use   Vaping Use: Never used  Substance Use Topics   Alcohol use: Not Currently    Comment: glass wine week per pt   Drug use: Yes    Types: Marijuana    Comment: once a day    Family History Family History  Problem Relation Age of Onset   Kidney disease Mother    Diabetes Mother    Cancer Father    Kidney disease Sister     Allergies  Allergen Reactions   Ace Inhibitors Swelling and Anaphylaxis   Ativan [Lorazepam] Other (See Comments)    Reaction:  Hallucinations and headaches   Compazine [Prochlorperazine Edisylate] Anaphylaxis, Nausea And Vomiting and Other (See Comments)    Other reaction(s): dystonia from this vs. Reglan, 23 Jul - patient relates that she takes promethazine frequently with no problems   Sumatriptan Succinate Other (See Comments)    Other reaction(s): delirium and hallucinations per [facility] records   Zofran [Ondansetron] Nausea And Vomiting    Per pt. she is allergic to zofran  or will experience adverse reaction like hallucination    Losartan Nausea Only   Prochlorperazine Other (See Comments)    Reaction:  Unknown . Patient does not remember reaction but she does have vertigo and anxiety along with n and v at times. Could be used to treat any of these    Reglan [Metoclopramide] Other (See Comments)    Per patient her Dr. Evelina Bucy her off it    Scopolamine Other (See Comments)    Dizziness, also has vertigo already   Tape Rash    Plastic tape causes rash   Tapentadol Rash   Ultrasound Gel Itching    Patient states the Ultrasound gel caused itching while in skin contact and resolved when wiped away.     REVIEW OF SYSTEMS (Negative unless checked)  Constitutional: '[]'$ Weight loss  '[]'$ Fever  '[]'$ Chills Cardiac: '[]'$ Chest pain   '[]'$ Chest pressure   '[]'$ Palpitations   '[]'$ Shortness of breath when laying flat   '[]'$ Shortness of breath with exertion. Vascular:  '[]'$ Pain in legs with walking   '[]'$ Pain in legs at rest  '[]'$ History of DVT   '[]'$ Phlebitis   '[]'$ Swelling in legs   '[]'$ Varicose veins   '[]'$ Non-healing ulcers Pulmonary:   '[]'$ Uses home oxygen   '[]'$ Productive cough   '[]'$ Hemoptysis   '[]'$ Wheeze  '[]'$ COPD   '[]'$ Asthma Neurologic:  '[]'$ Dizziness   '[]'$ Seizures   '[]'$ History of stroke   '[]'$ History of TIA  '[]'$ Aphasia   '[]'$ Vissual changes   '[]'$ Weakness or numbness in arm   '[]'$ Weakness or numbness in leg Musculoskeletal:   '[]'$ Joint swelling   '[]'$ Joint pain   '[]'$ Low back pain Hematologic:  '[]'$ Easy bruising  '[]'$ Easy bleeding   '[]'$ Hypercoagulable state   '[]'$ Anemic Gastrointestinal:  '[]'$ Diarrhea   '[]'$ Vomiting  '[]'$ Gastroesophageal reflux/heartburn   '[]'$ Difficulty swallowing. Genitourinary:  '[x]'$ Chronic  kidney disease   '[]'$ Difficult urination  '[]'$ Frequent urination   '[]'$ Blood in urine Skin:  '[]'$ Rashes   '[]'$ Ulcers  Psychological:  '[]'$ History of anxiety   '[]'$  History of major depression.  Physical Examination  There were no vitals filed for this visit. There is no height or weight on file to calculate BMI. Gen: WD/WN, NAD Head: /AT,  No temporalis wasting.  Ear/Nose/Throat: Hearing grossly intact, nares w/o erythema or drainage Eyes: PER, EOMI, sclera nonicteric.  Neck: Supple, no gross masses or lesions.  No JVD.  Pulmonary:  Good air movement, no audible wheezing, no use of accessory muscles.  Cardiac: RRR, precordium non-hyperdynamic. Vascular:   3-4+ lymphedema hard but pitting; catheter intact Vessel Right Left  Radial Palpable Palpable  Brachial Palpable Palpable  Gastrointestinal: soft, non-distended. No guarding/no peritoneal signs.  Musculoskeletal: M/S 5/5 throughout.  No deformity.  Neurologic: CN 2-12 intact. Pain and light touch intact in extremities.  Symmetrical.  Speech is fluent. Motor exam as listed above. Psychiatric: Judgment intact, Mood & affect appropriate for pt's clinical situation. Dermatologic: Venous rashes no ulcers noted.  No changes consistent with cellulitis.   CBC Lab Results  Component Value Date   WBC 4.4 11/29/2021   HGB 12.3 11/29/2021   HCT 39.1 11/29/2021   MCV 88.3 11/29/2021   PLT 282 11/29/2021    BMET    Component Value Date/Time   NA 130 (L) 12/03/2021 0539   NA 135 (L) 09/15/2014 0948   K 3.5 12/03/2021 0539   K 4.8 09/15/2014 0948   CL 92 (L) 12/03/2021 0539   CL 99 09/15/2014 0948   CO2 29 12/03/2021 0539   CO2 26 09/15/2014 0948   GLUCOSE 161 (H) 12/03/2021 0539   GLUCOSE 118 (H) 09/15/2014 0948   BUN 12 12/03/2021 0539   BUN 19 (H) 09/15/2014 0948   CREATININE 4.32 (H) 12/03/2021 0539   CREATININE 6.79 (H) 09/15/2014 0948   CALCIUM 8.2 (L) 12/03/2021 0539   CALCIUM 8.3 (L) 09/15/2014 0948   GFRNONAA 11 (L) 12/03/2021 0539   GFRNONAA 7 (L) 09/15/2014 0948   GFRNONAA 6 (L) 05/31/2014 0432   GFRAA 11 (L) 06/25/2020 0641   GFRAA 8 (L) 09/15/2014 0948   GFRAA 7 (L) 05/31/2014 0432   CrCl cannot be calculated (Patient's most recent lab result is older than the maximum 21 days allowed.).  COAG Lab Results  Component Value Date   INR 1.2 04/25/2021    INR 1.3 (H) 03/29/2021   INR 1.3 (H) 03/17/2021    Radiology No results found.   Assessment/Plan 1. ESRD on hemodialysis [facility]) Recommend:  The patient is doing well and currently has adequate dialysis access.  At this point she will remain catheter dependent.  The patient's dialysis center is not reporting any access issues. Flow pattern is stable when compared to the prior ultrasound.  The patient should have a duplex ultrasound of the dialysis access in 6 months. The patient will follow-up with me in the office after each ultrasound     2. Superior vena caval syndrome See #1  Very limited options for extremity access  3. Lymphedema Recommend:  No surgery or intervention at this point in time.    I have reviewed my discussion with the patient regarding lymphedema and why it  causes symptoms.  Patient will continue wearing graduated compression on a daily basis. The patient should put the compression on first thing in the morning and removing them in the evening. The patient should not sleep  in the compression.   In addition, behavioral modification throughout the day will be continued.  This will include frequent elevation (such as in a recliner), use of over the counter pain medications as needed and exercise such as walking.  The systemic causes for chronic edema such as liver, kidney and cardiac etiologies do not appear to have significant changed over the past year.    Despite conservative treatments including graduated compression therapy class 1 and behavioral modification including exercise and elevation the patient  has not obtained adequate control of the lymphedema.  The patient still has stage 3 lymphedema and therefore, I believe that a lymph pump should be added to improve the control of the patient's lymphedema.  Additionally, a lymph pump is warranted because it will reduce the risk of cellulitis and ulceration in the future.  Patient should follow-up in six  months    4. Coronary artery disease involving native coronary artery of native heart, unspecified whether angina present Continue cardiac and antihypertensive medications as already ordered and reviewed, no changes at this time.  Continue statin as ordered and reviewed, no changes at this time  Nitrates PRN for chest pain   5. Primary hypertension Continue antihypertensive medications as already ordered, these medications have been reviewed and there are no changes at this time.   6. Coronary artery disease involving native coronary artery of native heart without angina pectoris Continue cardiac and antihypertensive medications as already ordered and reviewed, no changes at this time.  Continue statin as ordered and reviewed, no changes at this time  Nitrates PRN for chest pain     Hortencia Pilar, MD  04/13/2022 1:15 PM

## 2022-04-14 ENCOUNTER — Encounter (INDEPENDENT_AMBULATORY_CARE_PROVIDER_SITE_OTHER): Payer: Self-pay | Admitting: Vascular Surgery

## 2022-04-14 ENCOUNTER — Ambulatory Visit (INDEPENDENT_AMBULATORY_CARE_PROVIDER_SITE_OTHER): Payer: Medicare Other | Admitting: Vascular Surgery

## 2022-04-14 VITALS — BP 167/70 | HR 70 | Resp 16 | Wt 169.4 lb

## 2022-04-14 DIAGNOSIS — I890 Lymphedema, not elsewhere classified: Secondary | ICD-10-CM | POA: Diagnosis not present

## 2022-04-14 DIAGNOSIS — Z992 Dependence on renal dialysis: Secondary | ICD-10-CM

## 2022-04-14 DIAGNOSIS — N186 End stage renal disease: Secondary | ICD-10-CM

## 2022-04-14 DIAGNOSIS — I871 Compression of vein: Secondary | ICD-10-CM | POA: Diagnosis not present

## 2022-04-14 DIAGNOSIS — I2510 Atherosclerotic heart disease of native coronary artery without angina pectoris: Secondary | ICD-10-CM | POA: Diagnosis not present

## 2022-04-14 DIAGNOSIS — I10 Essential (primary) hypertension: Secondary | ICD-10-CM

## 2022-04-17 ENCOUNTER — Encounter (INDEPENDENT_AMBULATORY_CARE_PROVIDER_SITE_OTHER): Payer: Self-pay | Admitting: Vascular Surgery

## 2022-04-28 ENCOUNTER — Other Ambulatory Visit: Payer: Self-pay

## 2022-04-28 ENCOUNTER — Emergency Department: Payer: Medicare Other

## 2022-04-28 ENCOUNTER — Inpatient Hospital Stay
Admission: EM | Admit: 2022-04-28 | Discharge: 2022-05-05 | DRG: 299 | Disposition: A | Discharge status: Home Health | Payer: Medicare Other | Attending: Internal Medicine | Admitting: Internal Medicine

## 2022-04-28 DIAGNOSIS — S91102A Unspecified open wound of left great toe without damage to nail, initial encounter: Secondary | ICD-10-CM | POA: Diagnosis present

## 2022-04-28 DIAGNOSIS — E669 Obesity, unspecified: Secondary | ICD-10-CM | POA: Diagnosis present

## 2022-04-28 DIAGNOSIS — I5032 Chronic diastolic (congestive) heart failure: Secondary | ICD-10-CM | POA: Diagnosis present

## 2022-04-28 DIAGNOSIS — J9601 Acute respiratory failure with hypoxia: Secondary | ICD-10-CM | POA: Diagnosis not present

## 2022-04-28 DIAGNOSIS — S91109A Unspecified open wound of unspecified toe(s) without damage to nail, initial encounter: Secondary | ICD-10-CM | POA: Diagnosis not present

## 2022-04-28 DIAGNOSIS — I10 Essential (primary) hypertension: Secondary | ICD-10-CM | POA: Diagnosis present

## 2022-04-28 DIAGNOSIS — D631 Anemia in chronic kidney disease: Secondary | ICD-10-CM | POA: Diagnosis present

## 2022-04-28 DIAGNOSIS — F32A Depression, unspecified: Secondary | ICD-10-CM | POA: Diagnosis present

## 2022-04-28 DIAGNOSIS — Z992 Dependence on renal dialysis: Secondary | ICD-10-CM

## 2022-04-28 DIAGNOSIS — D696 Thrombocytopenia, unspecified: Secondary | ICD-10-CM | POA: Diagnosis not present

## 2022-04-28 DIAGNOSIS — I132 Hypertensive heart and chronic kidney disease with heart failure and with stage 5 chronic kidney disease, or end stage renal disease: Secondary | ICD-10-CM | POA: Diagnosis present

## 2022-04-28 DIAGNOSIS — J9610 Chronic respiratory failure, unspecified whether with hypoxia or hypercapnia: Secondary | ICD-10-CM | POA: Diagnosis present

## 2022-04-28 DIAGNOSIS — Z85528 Personal history of other malignant neoplasm of kidney: Secondary | ICD-10-CM

## 2022-04-28 DIAGNOSIS — E119 Type 2 diabetes mellitus without complications: Secondary | ICD-10-CM

## 2022-04-28 DIAGNOSIS — K219 Gastro-esophageal reflux disease without esophagitis: Secondary | ICD-10-CM | POA: Diagnosis present

## 2022-04-28 DIAGNOSIS — I2510 Atherosclerotic heart disease of native coronary artery without angina pectoris: Secondary | ICD-10-CM | POA: Diagnosis present

## 2022-04-28 DIAGNOSIS — E871 Hypo-osmolality and hyponatremia: Secondary | ICD-10-CM | POA: Diagnosis not present

## 2022-04-28 DIAGNOSIS — J449 Chronic obstructive pulmonary disease, unspecified: Secondary | ICD-10-CM | POA: Diagnosis present

## 2022-04-28 DIAGNOSIS — Z20822 Contact with and (suspected) exposure to covid-19: Secondary | ICD-10-CM | POA: Diagnosis present

## 2022-04-28 DIAGNOSIS — N186 End stage renal disease: Secondary | ICD-10-CM | POA: Diagnosis not present

## 2022-04-28 DIAGNOSIS — Z79899 Other long term (current) drug therapy: Secondary | ICD-10-CM

## 2022-04-28 DIAGNOSIS — Z888 Allergy status to other drugs, medicaments and biological substances status: Secondary | ICD-10-CM

## 2022-04-28 DIAGNOSIS — K3184 Gastroparesis: Secondary | ICD-10-CM | POA: Diagnosis present

## 2022-04-28 DIAGNOSIS — E1122 Type 2 diabetes mellitus with diabetic chronic kidney disease: Secondary | ICD-10-CM | POA: Diagnosis present

## 2022-04-28 DIAGNOSIS — E785 Hyperlipidemia, unspecified: Secondary | ICD-10-CM | POA: Diagnosis present

## 2022-04-28 DIAGNOSIS — E1143 Type 2 diabetes mellitus with diabetic autonomic (poly)neuropathy: Secondary | ICD-10-CM | POA: Diagnosis present

## 2022-04-28 DIAGNOSIS — I340 Nonrheumatic mitral (valve) insufficiency: Secondary | ICD-10-CM | POA: Diagnosis present

## 2022-04-28 DIAGNOSIS — E1151 Type 2 diabetes mellitus with diabetic peripheral angiopathy without gangrene: Principal | ICD-10-CM | POA: Diagnosis present

## 2022-04-28 DIAGNOSIS — R509 Fever, unspecified: Secondary | ICD-10-CM | POA: Diagnosis not present

## 2022-04-28 DIAGNOSIS — N2581 Secondary hyperparathyroidism of renal origin: Secondary | ICD-10-CM | POA: Diagnosis present

## 2022-04-28 DIAGNOSIS — B9689 Other specified bacterial agents as the cause of diseases classified elsewhere: Secondary | ICD-10-CM | POA: Diagnosis present

## 2022-04-28 DIAGNOSIS — E11649 Type 2 diabetes mellitus with hypoglycemia without coma: Secondary | ICD-10-CM

## 2022-04-28 DIAGNOSIS — E875 Hyperkalemia: Secondary | ICD-10-CM | POA: Diagnosis not present

## 2022-04-28 DIAGNOSIS — Z9981 Dependence on supplemental oxygen: Secondary | ICD-10-CM

## 2022-04-28 DIAGNOSIS — Z89412 Acquired absence of left great toe: Secondary | ICD-10-CM

## 2022-04-28 DIAGNOSIS — E1169 Type 2 diabetes mellitus with other specified complication: Secondary | ICD-10-CM

## 2022-04-28 DIAGNOSIS — Z841 Family history of disorders of kidney and ureter: Secondary | ICD-10-CM

## 2022-04-28 DIAGNOSIS — X58XXXA Exposure to other specified factors, initial encounter: Secondary | ICD-10-CM | POA: Diagnosis present

## 2022-04-28 DIAGNOSIS — Z883 Allergy status to other anti-infective agents status: Secondary | ICD-10-CM

## 2022-04-28 DIAGNOSIS — Z809 Family history of malignant neoplasm, unspecified: Secondary | ICD-10-CM

## 2022-04-28 DIAGNOSIS — Z833 Family history of diabetes mellitus: Secondary | ICD-10-CM

## 2022-04-28 DIAGNOSIS — R531 Weakness: Secondary | ICD-10-CM | POA: Diagnosis not present

## 2022-04-28 DIAGNOSIS — Z6830 Body mass index (BMI) 30.0-30.9, adult: Secondary | ICD-10-CM

## 2022-04-28 DIAGNOSIS — F419 Anxiety disorder, unspecified: Secondary | ICD-10-CM | POA: Diagnosis present

## 2022-04-28 DIAGNOSIS — B952 Enterococcus as the cause of diseases classified elsewhere: Secondary | ICD-10-CM | POA: Diagnosis present

## 2022-04-28 DIAGNOSIS — I252 Old myocardial infarction: Secondary | ICD-10-CM

## 2022-04-28 DIAGNOSIS — L089 Local infection of the skin and subcutaneous tissue, unspecified: Secondary | ICD-10-CM | POA: Diagnosis not present

## 2022-04-28 DIAGNOSIS — Z91048 Other nonmedicinal substance allergy status: Secondary | ICD-10-CM

## 2022-04-28 DIAGNOSIS — Z7982 Long term (current) use of aspirin: Secondary | ICD-10-CM

## 2022-04-28 LAB — COMPREHENSIVE METABOLIC PANEL
ALT: 17 U/L (ref 0–44)
AST: 24 U/L (ref 15–41)
Albumin: 3.9 g/dL (ref 3.5–5.0)
Alkaline Phosphatase: 91 U/L (ref 38–126)
Anion gap: 14 (ref 5–15)
BUN: 37 mg/dL — ABNORMAL HIGH (ref 8–23)
CO2: 28 mmol/L (ref 22–32)
Calcium: 8.7 mg/dL — ABNORMAL LOW (ref 8.9–10.3)
Chloride: 95 mmol/L — ABNORMAL LOW (ref 98–111)
Creatinine, Ser: 5.79 mg/dL — ABNORMAL HIGH (ref 0.44–1.00)
GFR, Estimated: 8 mL/min — ABNORMAL LOW (ref >60)
Glucose, Bld: 90 mg/dL (ref 70–99)
Potassium: 5.1 mmol/L (ref 3.5–5.1)
Sodium: 137 mmol/L (ref 135–145)
Total Bilirubin: 0.6 mg/dL (ref 0.3–1.2)
Total Protein: 7.6 g/dL (ref 6.5–8.1)

## 2022-04-28 LAB — CBC WITH DIFFERENTIAL/PLATELET
Abs Immature Granulocytes: 0.03 10*3/uL (ref 0.00–0.07)
Basophils Absolute: 0 10*3/uL (ref 0.0–0.1)
Basophils Relative: 0 %
Eosinophils Absolute: 0.5 10*3/uL (ref 0.0–0.5)
Eosinophils Relative: 5 %
HCT: 27.5 % — ABNORMAL LOW (ref 36.0–46.0)
Hemoglobin: 8.8 g/dL — ABNORMAL LOW (ref 12.0–15.0)
Immature Granulocytes: 0 %
Lymphocytes Relative: 14 %
Lymphs Abs: 1.4 10*3/uL (ref 0.7–4.0)
MCH: 30.2 pg (ref 26.0–34.0)
MCHC: 32 g/dL (ref 30.0–36.0)
MCV: 94.5 fL (ref 80.0–100.0)
Monocytes Absolute: 0.3 10*3/uL (ref 0.1–1.0)
Monocytes Relative: 3 %
Neutro Abs: 7.5 10*3/uL (ref 1.7–7.7)
Neutrophils Relative %: 78 %
Platelets: 176 10*3/uL (ref 150–400)
RBC: 2.91 MIL/uL — ABNORMAL LOW (ref 3.87–5.11)
RDW: 14.8 % (ref 11.5–15.5)
WBC: 9.6 10*3/uL (ref 4.0–10.5)
nRBC: 0 % (ref 0.0–0.2)

## 2022-04-28 LAB — LACTIC ACID, PLASMA
Lactic Acid, Venous: 1.6 mmol/L (ref 0.5–1.9)
Lactic Acid, Venous: 1.4 mmol/L (ref 0.5–1.9)

## 2022-04-28 LAB — PROTIME-INR
INR: 1 (ref 0.8–1.2)
Prothrombin Time: 13.2 seconds (ref 11.4–15.2)

## 2022-04-28 LAB — BRAIN NATRIURETIC PEPTIDE: B Natriuretic Peptide: 663.3 pg/mL — ABNORMAL HIGH (ref 0.0–100.0)

## 2022-04-28 LAB — SARS CORONAVIRUS 2 BY RT PCR: SARS Coronavirus 2 by RT PCR: NEGATIVE

## 2022-04-28 MED ORDER — NITROGLYCERIN 0.4 MG SL SUBL: 0.4000 mg | SUBLINGUAL_TABLET | SUBLINGUAL | Status: DC | PRN

## 2022-04-28 MED ORDER — ACETAMINOPHEN 325 MG PO TABS
650.0000 mg | ORAL_TABLET | Freq: Four times a day (QID) | ORAL | Status: DC | PRN
  Administered 2022-04-29: 650 mg via ORAL
  Filled 2022-04-28 (×2): qty 2

## 2022-04-28 MED ORDER — MORPHINE SULFATE (PF) 2 MG/ML IV SOLN: 2.0000 mg | INTRAVENOUS | Status: DC | PRN

## 2022-04-28 MED ORDER — SODIUM CHLORIDE 0.9 % IV SOLN: INTRAVENOUS | Status: AC

## 2022-04-28 MED ORDER — SEVELAMER CARBONATE 800 MG PO TABS: 800.0000 mg | ORAL_TABLET | Freq: Four times a day (QID) | ORAL | Status: DC

## 2022-04-28 MED ORDER — HEPARIN SODIUM (PORCINE) 5000 UNIT/ML IJ SOLN
5000.0000 [IU] | Freq: Two times a day (BID) | INTRAMUSCULAR | Status: DC
  Administered 2022-04-29 – 2022-05-05 (×12): 5000 [IU] via SUBCUTANEOUS
  Filled 2022-04-28 (×12): qty 1

## 2022-04-28 MED ORDER — HYDROMORPHONE HCL 1 MG/ML IJ SOLN
0.5000 mg | Freq: Once | INTRAMUSCULAR | Status: AC
  Administered 2022-04-28: 0.5 mg via INTRAVENOUS
  Filled 2022-04-28: qty 0.5

## 2022-04-28 MED ORDER — SODIUM CHLORIDE 0.9 % IV SOLN
2.0000 g | Freq: Once | INTRAVENOUS | Status: AC
  Administered 2022-04-28: 2 g via INTRAVENOUS
  Filled 2022-04-28: qty 12.5

## 2022-04-28 MED ORDER — ALBUTEROL SULFATE (2.5 MG/3ML) 0.083% IN NEBU
2.5000 mg | INHALATION_SOLUTION | RESPIRATORY_TRACT | Status: DC | PRN
Start: 2022-04-28 — End: 2022-05-05

## 2022-04-28 MED ORDER — VANCOMYCIN HCL IN DEXTROSE 1-5 GM/200ML-% IV SOLN
1000.0000 mg | Freq: Once | INTRAVENOUS | Status: AC
  Administered 2022-04-28: 1000 mg via INTRAVENOUS
  Filled 2022-04-28: qty 200

## 2022-04-28 MED ORDER — ACETAMINOPHEN 650 MG RE SUPP
650.0000 mg | Freq: Four times a day (QID) | RECTAL | Status: DC | PRN
Start: 2022-04-28 — End: 2022-05-05

## 2022-04-28 MED ORDER — METRONIDAZOLE 500 MG/100ML IV SOLN
500.0000 mg | Freq: Once | INTRAVENOUS | Status: AC
  Administered 2022-04-28: 500 mg via INTRAVENOUS
  Filled 2022-04-28: qty 100

## 2022-04-28 MED ORDER — SODIUM CHLORIDE 0.9% FLUSH
3.0000 mL | Freq: Two times a day (BID) | INTRAVENOUS | Status: DC
  Administered 2022-04-29 – 2022-05-05 (×12): 3 mL via INTRAVENOUS

## 2022-04-28 MED ORDER — SEVELAMER CARBONATE 800 MG PO TABS
800.0000 mg | ORAL_TABLET | ORAL | Status: DC | PRN
  Administered 2022-05-01 – 2022-05-03 (×2): 800 mg via ORAL
  Filled 2022-04-28 (×2): qty 1

## 2022-04-28 MED ORDER — SODIUM CHLORIDE 0.9 % IV SOLN: 1.0000 g | INTRAVENOUS | Status: DC

## 2022-04-28 MED ORDER — RENA-VITE PO TABS
1.0000 | ORAL_TABLET | Freq: Every day | ORAL | Status: DC
  Administered 2022-04-29 – 2022-05-05 (×7): 1 via ORAL
  Filled 2022-04-28 (×7): qty 1

## 2022-04-28 MED ORDER — ACETAMINOPHEN 500 MG PO TABS
1000.0000 mg | ORAL_TABLET | Freq: Once | ORAL | Status: AC
  Administered 2022-04-28: 1000 mg via ORAL
  Filled 2022-04-28: qty 2

## 2022-04-28 MED ORDER — METRONIDAZOLE 500 MG/100ML IV SOLN
500.0000 mg | Freq: Two times a day (BID) | INTRAVENOUS | Status: DC
  Administered 2022-04-28 – 2022-04-29 (×2): 500 mg via INTRAVENOUS
  Filled 2022-04-28 (×2): qty 100

## 2022-04-28 MED ORDER — HYDROCODONE-ACETAMINOPHEN 5-325 MG PO TABS
1.0000 | ORAL_TABLET | ORAL | Status: DC | PRN
  Administered 2022-04-30 – 2022-05-05 (×12): 1 via ORAL
  Filled 2022-04-28 (×12): qty 1

## 2022-04-28 MED ORDER — AMLODIPINE BESYLATE 10 MG PO TABS
10.0000 mg | ORAL_TABLET | Freq: Every day | ORAL | Status: DC
  Administered 2022-04-29 – 2022-05-05 (×7): 10 mg via ORAL
  Filled 2022-04-28: qty 2
  Filled 2022-04-28 (×5): qty 1
  Filled 2022-04-28: qty 2
  Filled 2022-04-28: qty 1

## 2022-04-28 MED ORDER — MIRTAZAPINE 15 MG PO TABS
7.5000 mg | ORAL_TABLET | Freq: Every day | ORAL | Status: DC
  Administered 2022-04-29 – 2022-05-04 (×6): 7.5 mg via ORAL
  Filled 2022-04-28 (×6): qty 1

## 2022-04-28 MED ORDER — HYDRALAZINE HCL 20 MG/ML IJ SOLN: 10.0000 mg | Freq: Four times a day (QID) | INTRAMUSCULAR | Status: DC | PRN

## 2022-04-28 MED ORDER — SEVELAMER CARBONATE 800 MG PO TABS
800.0000 mg | ORAL_TABLET | Freq: Three times a day (TID) | ORAL | Status: DC
  Administered 2022-04-29 – 2022-05-05 (×17): 800 mg via ORAL
  Filled 2022-04-28 (×18): qty 1

## 2022-04-28 NOTE — ED Notes (Signed)
Pt has fistulas and rafts on both arms and states we only can get iv access to patients neck . Ed md made aware , ed rn unable to get iv access

## 2022-04-28 NOTE — ED Notes (Signed)
Patient transported to CT 

## 2022-04-28 NOTE — Consult Note (Signed)
PHARMACY -  BRIEF ANTIBIOTIC NOTE   Pharmacy has received consult(s) for sepsis from an ED provider.  The patient's profile has been reviewed for ht/wt/allergies/indication/available labs.    One time order(s) placed for cefepime and vancomycin  Further antibiotics/pharmacy consults should be ordered by admitting physician if indicated.                       Thank you, Oswald Hillock 04/28/2022  7:00 PM

## 2022-04-28 NOTE — ED Notes (Signed)
First nurse note- pt brought in via ems from home with fever.  Pt is Dialysis pt.  Tylenol '750mg'$  po given by ems  t 102.4, p-94, oxygen sats 94% rm air, bp 126/59   fsbs 165  per ems

## 2022-04-28 NOTE — ED Provider Notes (Signed)
[facility] Provider Note    Event Date/Time   First MD Initiated Contact with Patient 04/28/22 1813     (approximate)   History   Fever   HPI  [name] is a 61 y.o. female with past medical history of end-stage renal disease on dialysis Tuesday Thursday Saturday, diabetes diastolic heart failure coronary disease chronic respiratory failure on 2 L nasal cannula COPD anemia hyperlipidemia GERD presents with fever back pain myalgias chills.  Is somewhat difficult to get history of the patient as she is yelling about her back hurting and having chills.  She does tell me that she was in her normal state of health this morning and feeding her birds.  She denies any respiratory symptoms or chest pain denies abdominal pain vomiting diarrhea does not make urine.  Does complain of headache no visual change or neck pain.     Past Medical History:  Diagnosis Date   Anemia    Anginal pain (HCC)    Anxiety    Arthritis    Asthma    Broken wrist    Bronchitis    chronic diastolic CHF 0/93/2671   Chronic kidney disease    COPD (chronic obstructive pulmonary disease) ([place])    Coronary artery disease    a. cath 2013: stenting to RCA (report not available); b. cath 2014: LM nl, pLAD 40%, mLAD nl, ost LCx 40%, mid LCx nl, pRCA 30% @ site of prior stent, mRCA 50%   Depression    Diabetes mellitus ([place])    Diabetes mellitus without complication ([place])    Diabetic neuropathy ([place])    dialysis 2006   Diverticulosis    Dizziness    Dyspnea    Elevated lipids    Environmental and seasonal allergies    ESRD (end stage renal disease) on dialysis ([place])    M-W-F   Gastroparesis    GERD (gastroesophageal reflux disease)    Headache    History of anemia due to chronic kidney disease    History of hiatal hernia    HOH (hard of hearing)    Hx of pancreatitis 2015   Hypertension    Lower extremity edema    Mitral regurgitation    a. echo 10/2013: EF 62%, noWMA, mildly  dilated LA, mild to mod MR/TR, GR1DD   Myocardial infarction ([place])    Orthopnea    Parathyroid abnormality ([place])    Peripheral arterial disease ([place])    Pneumonia    Renal cancer ([place])    Renal insufficiency    Pt is on dialysis on M,W + F.   Wheezing     Patient Active Problem List   Diagnosis Date Noted   Pericardial effusion 12/05/2021   Hearing impairment 12/04/2021   Hyponatremia 11/27/2021   COVID-19 virus infection 11/27/2021   Bacterial pneumonia 11/27/2021   Acute GI bleeding 11/15/2021   Upper GI bleed 11/15/2021   Anemia of chronic kidney failure 05/03/2021   Thrombocytopenia ([place]) 05/03/2021   Severe sepsis with septic shock ([place]) 04/25/2021   HCAP (healthcare-associated pneumonia) 24/58/0998   Acute metabolic encephalopathy 33/82/5053   UTI (urinary tract infection) 03/29/2021   Chronic respiratory failure with hypoxia ([place]) 12/18/2020   Superior vena caval syndrome 12/14/2020   Chronic diastolic CHF (congestive heart failure) ([place]) 12/04/2020   Bradycardia 09/17/2020   Advanced care planning/counseling discussion    Discitis of thoracic region    Fluid overload 08/10/2020   HLD (hyperlipidemia) 08/10/2020   Chest pain  08/10/2020   CAD (coronary artery disease) 08/10/2020   GERD (gastroesophageal reflux disease) 08/10/2020   Obesity (BMI 30-39.9) 06/06/2020   Anemia in ESRD (end-stage renal disease) ([place]) 05/31/2020   Type 1 diabetes mellitus ([place]) 05/31/2020   End-stage renal disease ([place]) 05/31/2020   Hypertensive disorder 05/31/2020   Arm pain 05/21/2020   Cellulitis of right leg 05/02/2020   ESRD (end stage renal disease) ([place]) 04/13/2020   SOBOE (shortness of breath on exertion) 02/08/2020   Hematoma of arm, left, subsequent encounter 11/24/2019   Respiratory failure (HCC)    Shortness of breath    Complication of arteriovenous dialysis fistula 11/13/2019   PVD (peripheral vascular disease) ([place]) 11/13/2019   Severe anemia 11/13/2019   Pressure  ulcer, stage 2 ([place]) 11/13/2019   ESRD on hemodialysis ([place])    Hemorrhagic shock ([place]) 11/11/2019   Leg pain 10/02/2019   CAP (community acquired pneumonia) 10/01/2019   Atherosclerosis of native arteries of extremity with intermittent claudication ([place]) 05/01/2019   Right-sided headache    COPD (chronic obstructive pulmonary disease) ([place]) 10/06/2018   Syncope 10/04/2018   Symptomatic anemia 06/18/2018   Gastroparesis due to DM ([place]) 96/75/9163   Complication of vascular access for dialysis 12/04/2017   Osteomyelitis ([place]) 09/30/2017   Carotid stenosis 06/18/2017   Bilateral carotid artery stenosis 06/16/2017   Benign essential HTN 05/19/2017   CKD (chronic kidney disease) stage 5, GFR less than 15 ml/min ([place]) 05/19/2017   Hyperlipidemia, mixed 05/19/2017   Shortness of breath 05/04/2017   Rash, skin 12/16/2016   Cellulitis of lower extremity 07/29/2016   Chronic venous insufficiency 07/29/2016   Lymphedema 07/29/2016   TIA (transient ischemic attack) 04/21/2016   Altered mental status 04/08/2016   Hyperammonemia ([place]) 04/08/2016   Elevated troponin 04/08/2016   Depression 04/08/2016   Depression, major, recurrent, severe with psychosis ([place]) 04/08/2016   Blood in stool    Intractable cyclical vomiting with nausea    Reflux esophagitis    Gastritis    Generalized abdominal pain    Uncontrollable vomiting    Major depressive disorder, recurrent episode, moderate ([place]) 03/15/2016   Adjustment disorder with mixed anxiety and depressed mood 03/15/2016   Malnutrition of moderate degree 12/01/2015   Renal mass    Dyspnea    Acute renal failure ([place])    Respiratory failure ([place])    Fever 11/14/2015   Encounter for central line placement    Encounter for orogastric (OG) tube placement    Nausea 11/12/2015   Hyperkalemia 10/03/2015   Diarrhea, unspecified 07/22/2015   Hypoglycemia 04/24/2015   Unresponsiveness 04/24/2015   Sinus bradycardia 04/24/2015   Hypothermia  04/24/2015   Acute hypoxemic respiratory failure ([place]) 04/24/2015   Acute on chronic diastolic CHF (congestive heart failure) ([place]) 04/05/2015   Diabetic gastroparesis ([place]) 04/05/2015   Hypokalemia 04/05/2015   Physical debility 04/05/2015   Acute pulmonary edema ([place]) 04/03/2015   Nausea and vomiting 04/03/2015   Hypoglycemia associated with diabetes ([place]) 04/03/2015   Anemia of chronic disease 04/03/2015   Secondary hyperparathyroidism ([place]) 04/03/2015   Pressure ulcer 04/02/2015   Acute respiratory failure with hypoxia ([place]) 04/01/2015   Adjustment disorder with anxiety 03/14/2015   Somatic symptom disorder, mild 03/08/2015   Coronary artery disease involving native coronary artery of native heart without angina pectoris    Nausea & vomiting 03/06/2015   Abdominal pain 03/06/2015   Diabetes mellitus, type II ([place]) 03/06/2015   HTN (hypertension) 03/06/2015   Gastroparesis 02/24/2015   Pleural effusion 02/19/2015  Bacteremia due to Enterococcus 02/19/2015   End-stage renal disease on hemodialysis ([place]) 02/19/2015   Diarrhea 08/01/2014     Physical Exam  Triage Vital Signs: ED Triage Vitals  Enc Vitals Group     BP 04/28/22 1751 (!) 163/73     Pulse Rate 04/28/22 1751 (!) 103     Resp 04/28/22 1751 (!) 26     Temp 04/28/22 1751 (!) 103.1 F (39.5 C)     Temp Source 04/28/22 1751 Oral     SpO2 04/28/22 1751 90 %     Weight 04/28/22 1826 169 lb 5 oz (76.8 kg)     Height 04/28/22 1826 '5\' 2"'$  (1.575 m)     Head Circumference --      Peak Flow --      Pain Score 04/28/22 1826 10     Pain Loc --      Pain Edu? --      Excl. in [place]? --     Most recent vital signs: Vitals:   04/28/22 2105 04/28/22 2200  BP: (!) 130/52 (!) 131/50  Pulse: 86 75  Resp: 19 15  Temp:    SpO2: 99% 100%     General: Awake, appears uncomfortable she is yelling out CV:  Good peripheral perfusion.  Skin changes in the lower extremities, pitting edema Resp:  Normal effort.  No increased  work of breathing lungs are clear Abd:  No distention.  Abdomen is tender throughout Neuro:             Awake, Alert, Oriented x 3  Other:  Patient has midline lumbar tenderness   ED Results / Procedures / Treatments  Labs (all labs ordered are listed, but only abnormal results are displayed) Labs Reviewed  COMPREHENSIVE METABOLIC PANEL - Abnormal; Notable for the following components:      Result Value   Chloride 95 (*)    BUN 37 (*)    Creatinine, Ser 5.79 (*)    Calcium 8.7 (*)    GFR, Estimated 8 (*)    All other components within normal limits  CBC WITH DIFFERENTIAL/PLATELET - Abnormal; Notable for the following components:   RBC 2.91 (*)    Hemoglobin 8.8 (*)    HCT 27.5 (*)    All other components within normal limits  BRAIN NATRIURETIC PEPTIDE - Abnormal; Notable for the following components:   B Natriuretic Peptide 663.3 (*)    All other components within normal limits  SARS CORONAVIRUS 2 BY RT PCR  CULTURE, BLOOD (ROUTINE X 2)  CULTURE, BLOOD (ROUTINE X 2)  LACTIC ACID, PLASMA  LACTIC ACID, PLASMA  PROTIME-INR  URINALYSIS, ROUTINE W REFLEX MICROSCOPIC  URINALYSIS, COMPLETE (UACMP) WITH MICROSCOPIC  CK  HIV ANTIBODY (ROUTINE TESTING W REFLEX)  COMPREHENSIVE METABOLIC PANEL  CBC  TSH  T4, FREE  TROPONIN I (HIGH SENSITIVITY)  TROPONIN I (HIGH SENSITIVITY)     EKG  EKG shows normal sinus rhythm normal axis normal intervals low voltage no acute ischemic changes   RADIOLOGY Chest x-ray interpreted by myself shows patchy opacity in the right middle lobe   PROCEDURES:  Critical Care performed: Yes, see critical care procedure note(s)  .Critical Care  Performed by: Rada Hay, MD Authorized by: Rada Hay, MD   Critical care provider statement:    Critical care time (minutes):  30   Critical care was time spent personally by me on the following activities:  Development of treatment plan with patient or surrogate, discussions with  consultants, evaluation of patient's response to treatment, examination of patient, ordering and review of laboratory studies, ordering and review of radiographic studies, ordering and performing treatments and interventions, pulse oximetry, re-evaluation of patient's condition and review of old charts   The patient is on the cardiac monitor to evaluate for evidence of arrhythmia and/or significant heart rate changes.   MEDICATIONS ORDERED IN ED: Medications  metroNIDAZOLE (FLAGYL) IVPB 500 mg (500 mg Intravenous New Bag/Given 04/28/22 2237)  albuterol (PROVENTIL) (2.5 MG/3ML) 0.083% nebulizer solution 2.5 mg (has no administration in time range)  mirtazapine (REMERON) tablet 7.5 mg (has no administration in time range)  nitroGLYCERIN (NITROSTAT) SL tablet 0.4 mg (has no administration in time range)  multivitamin (RENA-VIT) tablet 1 tablet (has no administration in time range)  amLODipine (NORVASC) tablet 10 mg (has no administration in time range)  heparin injection 5,000 Units (has no administration in time range)  sodium chloride flush (NS) 0.9 % injection 3 mL (has no administration in time range)  0.9 %  sodium chloride infusion (has no administration in time range)  acetaminophen (TYLENOL) tablet 650 mg (has no administration in time range)    Or  acetaminophen (TYLENOL) suppository 650 mg (has no administration in time range)  HYDROcodone-acetaminophen (NORCO/VICODIN) 5-325 MG per tablet 1 tablet (has no administration in time range)  morphine (PF) 2 MG/ML injection 2 mg (has no administration in time range)  hydrALAZINE (APRESOLINE) injection 10 mg (has no administration in time range)  ceFEPIme (MAXIPIME) 1 g in sodium chloride 0.9 % 100 mL IVPB (has no administration in time range)  vancomycin (VANCOCIN) IVPB 1000 mg/200 mL premix (has no administration in time range)  sevelamer carbonate (RENVELA) tablet 800 mg (has no administration in time range)    And  sevelamer carbonate  (RENVELA) tablet 800 mg (has no administration in time range)  acetaminophen (TYLENOL) tablet 1,000 mg (1,000 mg Oral Given 04/28/22 1934)  HYDROmorphone (DILAUDID) injection 0.5 mg (0.5 mg Intravenous Given 04/28/22 1935)  ceFEPIme (MAXIPIME) 2 g in sodium chloride 0.9 % 100 mL IVPB (0 g Intravenous Stopped 04/28/22 2002)  metroNIDAZOLE (FLAGYL) IVPB 500 mg (0 mg Intravenous Stopped 04/28/22 2053)  vancomycin (VANCOCIN) IVPB 1000 mg/200 mL premix (0 mg Intravenous Stopped 04/28/22 2109)     IMPRESSION / MDM / ASSESSMENT AND PLAN / ED COURSE  I reviewed the triage vital signs and the nursing notes.                              Patient's presentation is most consistent with acute presentation with potential threat to life or bodily function.  Differential diagnosis includes, but is not limited to, sepsis secondary to central line infection, bacteremia, pneumonia, intra-abdominal infection, pyelonephritis UTI, meningitis/encephalitis  The patient is a 61 year old female with multiple medical comorbidities presents today for somewhat unclear reasons.  However she is febrile to 103 tachycardic and tachypneic meeting sepsis criteria.  On my initial evaluation patient is quite uncomfortable.  She is yelling about being in pain in her back and all over and having chills and feeling cold.  Somewhat difficult to get history from her.  She is tender in her abdomen throughout she has midline lumbar tenderness but is not in any respiratory distress she has no meningismus able to follow commands.  No obvious skin or soft tissue infection.  Chest x-ray to me looks like potentially a pneumonia in the right side read as radiology is pulmonary  edema.  CT head chest abdomen pelvis is essentially unremarkable for acute pathology.  No obvious pneumonia on CT of the chest.  Patient's labs overall reassuring she has no leukocytosis normal lactate.  Her hemoglobin is 8.8 down from 12 5 months ago months ago, patient denies any GI  bleeding.  She is at high risk for bacteremia from central line infection given she is a dialysis patient with indwelling line will give Vanco cefepime Flagyl.  Meningitis/encephalitis however patient has no meningismus, overall clinically my suspicion for this is lower.  Will defer lumbar puncture.       FINAL CLINICAL IMPRESSION(S) / ED DIAGNOSES   Final diagnoses:  Fever, unspecified fever cause     Rx / DC Orders   ED Discharge Orders     None        Note:  This document was prepared using Dragon voice recognition software and may include unintentional dictation errors.   Rada Hay, MD 04/28/22 [phone]

## 2022-04-28 NOTE — Consult Note (Signed)
CODE SEPSIS - PHARMACY COMMUNICATION  **Broad Spectrum Antibiotics should be administered within 1 hour of Sepsis diagnosis**  Time Code Sepsis Called/Page Received: 1908  Antibiotics Ordered: cefepime and vancomycin   Time of 1st antibiotic administration: [place] ,PharmD Clinical Pharmacist  04/28/2022  7:48 PM

## 2022-04-28 NOTE — Consult Note (Signed)
Pharmacy Antibiotic Note  [name] is a 61 y.o. female admitted on 04/28/2022 with  fever .  Pharmacy has been consulted for vancomycin + cefepime dosing. HD on TTS. Unknown source.   Plan: Start Cefepime 1 g daily  Pt received vancomycin 1000 mg x 1 in the ED. Will order another vancomycin 1000 mg x 1 to complete vancomycin loading dose. Pt is on HD TTS. Will follow up with HD schedule and order vancomycin order. Plan to order vancomycin after prior to the 3rd dose.   Height: '5\' 2"'$  (157.5 cm) Weight: 76.8 kg (169 lb 5 oz) IBW/kg (Calculated) : 50.1  Temp (24hrs), Avg:101.5 F (38.6 C), Min:99.9 F (37.7 C), Max:103.1 F (39.5 C)  Recent Labs  Lab 04/28/22 1759  WBC 9.6  CREATININE 5.79*  LATICACIDVEN 1.6    Estimated Creatinine Clearance: 9.7 mL/min (A) (by C-G formula based on SCr of 5.79 mg/dL (H)).    Allergies  Allergen Reactions   Ace Inhibitors Swelling and Anaphylaxis   Ativan [Lorazepam] Other (See Comments)    Reaction:  Hallucinations and headaches   Compazine [Prochlorperazine Edisylate] Anaphylaxis, Nausea And Vomiting and Other (See Comments)    Other reaction(s): dystonia from this vs. Reglan, 23 Jul - patient relates that she takes promethazine frequently with no problems   Sumatriptan Succinate Other (See Comments)    Other reaction(s): delirium and hallucinations per [facility] records   Zofran [Ondansetron] Nausea And Vomiting    Per pt. she is allergic to zofran or will experience adverse reaction like hallucination    Losartan Nausea Only   Prochlorperazine Other (See Comments)    Reaction:  Unknown . Patient does not remember reaction but she does have vertigo and anxiety along with n and v at times. Could be used to treat any of these    Reglan [Metoclopramide] Other (See Comments)    Per patient her Dr. Evelina Bucy her off it    Scopolamine Other (See Comments)    Dizziness, also has vertigo already   Tape Rash    Plastic tape causes rash   Tapentadol Rash    Ultrasound Gel Itching    Patient states the Ultrasound gel caused itching while in skin contact and resolved when wiped away.    Antimicrobials this admission: 8/7 cefepime >>  8/7 vancomycin >>   Dose adjustments this admission: none  Microbiology results: 8/7 BCx: pending   Thank you for allowing pharmacy to be a part of this patient's care.  Oswald Hillock 04/28/2022 9:37 PM

## 2022-04-28 NOTE — ED Notes (Signed)
Pt has returned from CT.  

## 2022-04-29 ENCOUNTER — Other Ambulatory Visit: Payer: Self-pay

## 2022-04-29 DIAGNOSIS — R509 Fever, unspecified: Secondary | ICD-10-CM | POA: Diagnosis not present

## 2022-04-29 LAB — RESPIRATORY PANEL BY PCR

## 2022-04-29 LAB — CBC
HCT: 24.6 % — ABNORMAL LOW (ref 36.0–46.0)
Hemoglobin: 8 g/dL — ABNORMAL LOW (ref 12.0–15.0)
MCH: 31.3 pg (ref 26.0–34.0)
MCHC: 32.5 g/dL (ref 30.0–36.0)
MCV: 96.1 fL (ref 80.0–100.0)
Platelets: 149 10*3/uL — ABNORMAL LOW (ref 150–400)
RBC: 2.56 MIL/uL — ABNORMAL LOW (ref 3.87–5.11)
RDW: 15.1 % (ref 11.5–15.5)
WBC: 11.6 10*3/uL — ABNORMAL HIGH (ref 4.0–10.5)
nRBC: 0 % (ref 0.0–0.2)

## 2022-04-29 LAB — TROPONIN I (HIGH SENSITIVITY)
Troponin I (High Sensitivity): 15 ng/L (ref <18)
Troponin I (High Sensitivity): 21 ng/L — ABNORMAL HIGH (ref <18)

## 2022-04-29 LAB — COMPREHENSIVE METABOLIC PANEL
ALT: 32 U/L (ref 0–44)
AST: 43 U/L — ABNORMAL HIGH (ref 15–41)
Albumin: 2.8 g/dL — ABNORMAL LOW (ref 3.5–5.0)
Alkaline Phosphatase: 74 U/L (ref 38–126)
Anion gap: 9 (ref 5–15)
BUN: 46 mg/dL — ABNORMAL HIGH (ref 8–23)
CO2: 28 mmol/L (ref 22–32)
Calcium: 7.9 mg/dL — ABNORMAL LOW (ref 8.9–10.3)
Chloride: 96 mmol/L — ABNORMAL LOW (ref 98–111)
Creatinine, Ser: 6.63 mg/dL — ABNORMAL HIGH (ref 0.44–1.00)
GFR, Estimated: 7 mL/min — ABNORMAL LOW (ref >60)
Glucose, Bld: 63 mg/dL — ABNORMAL LOW (ref 70–99)
Potassium: 5 mmol/L (ref 3.5–5.1)
Sodium: 133 mmol/L — ABNORMAL LOW (ref 135–145)
Total Bilirubin: 1 mg/dL (ref 0.3–1.2)
Total Protein: 6.1 g/dL — ABNORMAL LOW (ref 6.5–8.1)

## 2022-04-29 LAB — GLUCOSE, CAPILLARY
Glucose-Capillary: 66 mg/dL — ABNORMAL LOW (ref 70–99)
Glucose-Capillary: 104 mg/dL — ABNORMAL HIGH (ref 70–99)

## 2022-04-29 LAB — HIV ANTIBODY (ROUTINE TESTING W REFLEX): HIV Screen 4th Generation wRfx: NONREACTIVE

## 2022-04-29 LAB — T4, FREE: Free T4: 0.9 ng/dL (ref 0.61–1.12)

## 2022-04-29 LAB — HEPATITIS C ANTIBODY: HCV Ab: NONREACTIVE

## 2022-04-29 LAB — CK: Total CK: 35 U/L — ABNORMAL LOW (ref 38–234)

## 2022-04-29 LAB — HEPATITIS B SURFACE ANTIGEN: Hepatitis B Surface Ag: NONREACTIVE

## 2022-04-29 LAB — HEPATITIS B CORE ANTIBODY, TOTAL: Hep B Core Total Ab: REACTIVE — AB

## 2022-04-29 LAB — CBG MONITORING, ED
Glucose-Capillary: 54 mg/dL — ABNORMAL LOW (ref 70–99)
Glucose-Capillary: 69 mg/dL — ABNORMAL LOW (ref 70–99)
Glucose-Capillary: 79 mg/dL (ref 70–99)

## 2022-04-29 LAB — PROCALCITONIN: Procalcitonin: 4.45 ng/mL

## 2022-04-29 LAB — TSH: TSH: 2.614 u[IU]/mL (ref 0.350–4.500)

## 2022-04-29 LAB — HEPATITIS B SURFACE ANTIBODY,QUALITATIVE: Hep B S Ab: REACTIVE — AB

## 2022-04-29 MED ORDER — LIDOCAINE-PRILOCAINE 2.5-2.5 % EX CREA: 1.0000 | TOPICAL_CREAM | CUTANEOUS | Status: DC | PRN

## 2022-04-29 MED ORDER — PANTOPRAZOLE SODIUM 40 MG PO TBEC
40.0000 mg | DELAYED_RELEASE_TABLET | Freq: Every day | ORAL | Status: DC
  Administered 2022-04-29 – 2022-05-05 (×7): 40 mg via ORAL
  Filled 2022-04-29 (×7): qty 1

## 2022-04-29 MED ORDER — ANTICOAGULANT SODIUM CITRATE 4% (200MG/5ML) IV SOLN: 5.0000 mL | Status: DC | PRN

## 2022-04-29 MED ORDER — GABAPENTIN 100 MG PO CAPS
100.0000 mg | ORAL_CAPSULE | Freq: Three times a day (TID) | ORAL | Status: DC
  Administered 2022-04-29 – 2022-05-05 (×17): 100 mg via ORAL
  Filled 2022-04-29 (×17): qty 1

## 2022-04-29 MED ORDER — HEPARIN SODIUM (PORCINE) 1000 UNIT/ML DIALYSIS: 1000.0000 [IU] | INTRAMUSCULAR | Status: DC | PRN

## 2022-04-29 MED ORDER — INSULIN ASPART 100 UNIT/ML IJ SOLN
0.0000 [IU] | Freq: Three times a day (TID) | INTRAMUSCULAR | Status: DC
  Administered 2022-05-02: 2 [IU] via SUBCUTANEOUS
  Administered 2022-05-02 – 2022-05-05 (×3): 1 [IU] via SUBCUTANEOUS
  Filled 2022-04-29 (×3): qty 1

## 2022-04-29 MED ORDER — CHLORHEXIDINE GLUCONATE CLOTH 2 % EX PADS
6.0000 | MEDICATED_PAD | Freq: Every day | CUTANEOUS | Status: DC
  Administered 2022-04-30 – 2022-05-05 (×6): 6 via TOPICAL
  Filled 2022-04-29: qty 6

## 2022-04-29 MED ORDER — ALTEPLASE 2 MG IJ SOLR: 2.0000 mg | Freq: Once | INTRAMUSCULAR | Status: DC | PRN

## 2022-04-29 MED ORDER — PENTAFLUOROPROP-TETRAFLUOROETH EX AERO: 1.0000 | INHALATION_SPRAY | CUTANEOUS | Status: DC | PRN

## 2022-04-29 MED ORDER — POLYETHYLENE GLYCOL 3350 17 G PO PACK
34.0000 g | PACK | Freq: Two times a day (BID) | ORAL | Status: DC
  Administered 2022-04-29 – 2022-05-04 (×3): 34 g via ORAL
  Filled 2022-04-29 (×8): qty 2

## 2022-04-29 MED ORDER — LIDOCAINE HCL (PF) 1 % IJ SOLN: 5.0000 mL | INTRAMUSCULAR | Status: DC | PRN

## 2022-04-29 MED ORDER — NEPRO/CARBSTEADY PO LIQD: 237.0000 mL | Freq: Two times a day (BID) | ORAL | Status: DC

## 2022-04-29 MED ORDER — HEPARIN SODIUM (PORCINE) 1000 UNIT/ML IJ SOLN
3000.0000 [IU] | Freq: Once | INTRAMUSCULAR | Status: AC
  Administered 2022-04-29: 2000 [IU]
  Filled 2022-04-29: qty 3

## 2022-04-29 MED ORDER — BUTALBITAL-APAP-CAFFEINE 50-325-40 MG PO TABS
2.0000 | ORAL_TABLET | Freq: Once | ORAL | Status: AC
Start: 2022-04-29 — End: 2022-05-01
  Administered 2022-05-01: 2 via ORAL
  Filled 2022-04-29: qty 2

## 2022-04-29 MED ORDER — PANCRELIPASE (LIP-PROT-AMYL) 12000-38000 UNITS PO CPEP
36000.0000 [IU] | ORAL_CAPSULE | Freq: Three times a day (TID) | ORAL | Status: DC
  Administered 2022-04-29 – 2022-05-02 (×7): 36000 [IU] via ORAL
  Filled 2022-04-29 (×7): qty 3
  Filled 2022-04-29: qty 1

## 2022-04-29 MED ORDER — ASPIRIN 81 MG PO TBEC
81.0000 mg | DELAYED_RELEASE_TABLET | Freq: Every day | ORAL | Status: DC
  Administered 2022-04-29 – 2022-05-05 (×7): 81 mg via ORAL
  Filled 2022-04-29 (×7): qty 1

## 2022-04-29 NOTE — Progress Notes (Signed)
Pre HD info 

## 2022-04-29 NOTE — Assessment & Plan Note (Signed)
Vitals:   04/28/22 1751 04/28/22 2000 04/28/22 2030 04/28/22 2105  BP: (!) 163/73 (!) 147/52 (!) 141/51 (!) 130/52   04/28/22 2200 04/29/22 0000 04/29/22 0100  BP: (!) 131/50 (!) 135/53 126/63  Patient continued on amlodipine, as needed hydralazine.

## 2022-04-29 NOTE — Progress Notes (Signed)
Report from ED RN. Pt to receive D50 and CBG recheck before coming to HD unit. Gwyneth Revels, NP present and aware.

## 2022-04-29 NOTE — ED Notes (Signed)
Pt drank 1 orange juice, 1 apple juice, and ate one banana.

## 2022-04-29 NOTE — ED Notes (Signed)
Pt resting with no signs of distress and no needs.

## 2022-04-29 NOTE — Progress Notes (Signed)
[facility]  ROUNDING NOTE   Subjective:   [name] is a 61 y.o., female with past medical history of anemia, asthma, diabetes, CAD, PAD, GERD, and end stage renal disease on dialysis. Patient present to ED with fever that began yesterday. She has been admitted for Fever [R50.9]  Patient is known to our clinic and receives outpatient dialysis at [facility] on a TTS schedule. She did receive a full treatment on Saturday. Patient states she felt well yesterday, did some work in the yard and went back in the house. Once in the house, she began to feel bad after turning the [facility] off. Also complaints of head and lower extremity pain. She is alert and oriented. Remains on room air.   Labs on admission include sodium 133, potassium 5.0, glucose 63, BUN 43, calcium 7.9, and creatinine 6.63 with GFR 7.  Troponin 21.  Hemoglobin 8.0 with WBC 11.6.  COVID-19 PCR negative.  Expanded respiratory panel still pending.  CT head negative for acute changes.  CT abdomen pelvis shows small right and trace left pleural effusions, constipation and mild heterogeneous pulmonary parenchyma.  We have been consulted to manage dialysis needs.   Objective:  Vital signs in last 24 hours:  Temp:  [98 F (36.7 C)-103.1 F (39.5 C)] 98 F (36.7 C) (08/08 1338) Pulse Rate:  [58-103] 64 (08/08 1550) Resp:  [10-26] 12 (08/08 1550) BP: (103-163)/(45-73) 145/51 (08/08 1550) SpO2:  [90 %-100 %] 100 % (08/08 1550) Weight:  [76.8 kg-78.8 kg] 78.8 kg (08/08 1338)  Weight change:  Filed Weights   04/28/22 1826 04/29/22 1338  Weight: 76.8 kg 78.8 kg    Intake/Output: No intake/output data recorded.   Intake/Output this shift:  Total I/O In: 197 [IV Piggyback:197] Out: -   Physical Exam: General: NAD, resting quietly  Head: Normocephalic, atraumatic. Moist oral mucosal membranes  Eyes: Anicteric  Lungs:  Clear to auscultation, normal effort, room air  Heart: Regular rate and rhythm  Abdomen:   Soft, nontender  Extremities: No peripheral edema.  Neurologic: Nonfocal, moving all four extremities  Skin: No lesions  Access: Right chest PermCath    Basic Metabolic Panel: Recent Labs  Lab 04/28/22 1759 04/29/22 0602  NA 137 133*  K 5.1 5.0  CL 95* 96*  CO2 28 28  GLUCOSE 90 63*  BUN 37* 46*  CREATININE 5.79* 6.63*  CALCIUM 8.7* 7.9*    Liver Function Tests: Recent Labs  Lab 04/28/22 1759 04/29/22 0602  AST 24 43*  ALT 17 32  ALKPHOS 91 74  BILITOT 0.6 1.0  PROT 7.6 6.1*  ALBUMIN 3.9 2.8*   No results for input(s): "LIPASE", "AMYLASE" in the last 168 hours. No results for input(s): "AMMONIA" in the last 168 hours.  CBC: Recent Labs  Lab 04/28/22 1759 04/29/22 0602  WBC 9.6 11.6*  NEUTROABS 7.5  --   HGB 8.8* 8.0*  HCT 27.5* 24.6*  MCV 94.5 96.1  PLT 176 149*    Cardiac Enzymes: Recent Labs  Lab 04/28/22 1759  CKTOTAL 35*    BNP: Invalid input(s): "POCBNP"  CBG: Recent Labs  Lab 04/29/22 0919 04/29/22 1037 04/29/22 1234  GLUCAP 54* 73* 88    Microbiology: Results for orders placed or performed during the hospital encounter of 04/28/22  Culture, blood (Routine x 2)     Status: None (Preliminary result)   Collection Time: 04/28/22  5:59 PM   Specimen: BLOOD  Result Value Ref Range Status   Specimen Description  BLOOD BLOOD RIGHT FOREARM  Final   Special Requests   Final    BOTTLES DRAWN AEROBIC AND ANAEROBIC Blood Culture adequate volume   Culture   Final    NO GROWTH < 24 HOURS Performed at [facility], [address] [place], [place] [address]    Report Status PENDING  Incomplete  Culture, blood (Routine x 2)     Status: None (Preliminary result)   Collection Time: 04/28/22  6:04 PM   Specimen: BLOOD  Result Value Ref Range Status   Specimen Description BLOOD BLOOD RIGHT FOREARM  Final   Special Requests   Final    BOTTLES DRAWN AEROBIC AND ANAEROBIC Blood Culture adequate volume   Culture   Final    NO GROWTH <  12 HOURS Performed at [facility], [address] [place], [place] [address]    Report Status PENDING  Incomplete  SARS Coronavirus 2 by RT PCR (hospital order, performed in [facility] hospital lab) *cepheid single result test* Anterior Nasal Swab     Status: None   Collection Time: 04/28/22  8:09 PM   Specimen: Anterior Nasal Swab  Result Value Ref Range Status   SARS Coronavirus 2 by RT PCR NEGATIVE NEGATIVE Final    Comment: (NOTE) SARS-CoV-2 target nucleic acids are NOT DETECTED.  The SARS-CoV-2 RNA is generally detectable in upper and lower respiratory specimens during the acute phase of infection. The lowest concentration of SARS-CoV-2 viral copies this assay can detect is 250 copies / mL. A negative result does not preclude SARS-CoV-2 infection and should not be used as the sole basis for treatment or other patient management decisions.  A negative result may occur with improper specimen collection / handling, submission of specimen other than nasopharyngeal swab, presence of viral mutation(s) within the areas targeted by this assay, and inadequate number of viral copies (<250 copies / mL). A negative result must be combined with clinical observations, patient history, and epidemiological information.  Fact Sheet for Patients:   https://www.patel.info/  Fact Sheet for Healthcare Providers: https://hall.com/  This test is not yet approved or  cleared by the [place] FDA and has been authorized for detection and/or diagnosis of SARS-CoV-2 by FDA under an Emergency Use Authorization (EUA).  This EUA will remain in effect (meaning this test can be used) for the duration of the COVID-19 declaration under Section 564(b)(1) of the Act, 21 U.S.C. section 360bbb-3(b)(1), unless the authorization is terminated or revoked sooner.  Performed at [facility], [address] [place], [place] [address]      Coagulation Studies: Recent Labs    04/28/22 1759  LABPROT 13.2  INR 1.0    Urinalysis: No results for input(s): "COLORURINE", "LABSPEC", "PHURINE", "GLUCOSEU", "HGBUR", "BILIRUBINUR", "KETONESUR", "PROTEINUR", "UROBILINOGEN", "NITRITE", "LEUKOCYTESUR" in the last 72 hours.  Invalid input(s): "APPERANCEUR"    Imaging: CT CHEST ABDOMEN PELVIS WO CONTRAST  Result Date: 04/28/2022 CLINICAL DATA:  Pneumonia, complication suspected, xray done EXAM: CT CHEST, ABDOMEN AND PELVIS WITHOUT CONTRAST TECHNIQUE: Multidetector CT imaging of the chest, abdomen and pelvis was performed following the standard protocol without IV contrast. RADIATION DOSE REDUCTION: This exam was performed according to the departmental dose-optimization program which includes automated exposure control, adjustment of the mA and/or kV according to patient size and/or use of iterative reconstruction technique. COMPARISON:  Radiograph earlier today. Chest CT 12/03/2021. Abdominopelvic CT 11/15/2021 FINDINGS: CT CHEST FINDINGS Cardiovascular: Right-sided dialysis catheter tip in the right atrium. Mild cardiomegaly with coronary artery calcifications. Aortic  atherosclerosis. Mild dilatation of the main pulmonary artery at 3.2 cm. No pericardial effusion. Left subclavian stent in place. Multiple vascular stents in the forearm. There are mild chest wall collaterals. Mediastinum/Nodes: Multifocal mediastinal adenopathy with improvement from prior exam. For example AP window node measures 8 mm short axis previously 11 mm. Anterior lower paratracheal node measures 11 mm, previously 14 mm. Patulous esophagus. No visible thyroid nodule. Lungs/Pleura: Small right and trace left pleural effusion, diminished in size from prior CT. There is associated atelectasis in the lung bases. Mild heterogeneous pulmonary parenchyma. Occasional areas of subpleural reticulation in both lungs. No confluent consolidation. Unremarkable appearance of the trachea  and central bronchi. Musculoskeletal: No acute osseous findings. Mild diffuse sclerosis and ground-glass opacities the osseous structures consistent with renal osteodystrophy. Chronic areas of endplate sclerosis and irregularity at the T7-T8, unchanged. CT ABDOMEN PELVIS FINDINGS Hepatobiliary: No focal hepatic abnormality on this unenhanced exam. Clips in the gallbladder fossa postcholecystectomy. No biliary dilatation. Pancreas: Mild fatty atrophy.  No ductal dilatation or inflammation. Spleen: Normal in size without focal abnormality. Adrenals/Urinary Tract: No adrenal nodule. Sequela of chronic renal disease with marked renal atrophy and renal vascular calcifications. Small simple cyst in the mid left kidney, needs no further follow-up. Suspicious renal lesion. Decompressed urinary bladder. Stomach/Bowel: Partially distended stomach. No small bowel obstruction or inflammation. Appendix is not definitively visualized. Moderate to large volume of stool throughout the colon. Few diverticula in the right and sigmoid colon without diverticulitis. No colonic inflammation. Vascular/Lymphatic: Advanced aortic and branch atherosclerosis. No aneurysm. No portal venous gas. No enlarged lymph nodes in the abdomen. There are prominent bilateral inguinal and left external iliac nodes, largest measuring 14 mm in the low left inguinal region. Reproductive: Status post hysterectomy. No adnexal masses. There is air in the vagina, nonspecific. Other: No ascites. No free air. There is generalized body wall edema, although improved from prior exam. Musculoskeletal: No acute osseous findings. Mild generalized sclerosis and ground-glass opacity of the osseous structures consistent with renal osteodystrophy. Small Schmorl's node superior endplates of L2 and L3. IMPRESSION: 1. Small right and trace left pleural effusions, diminished in size from March CT. There is associated atelectasis in the lung bases. 2. Mild heterogeneous pulmonary  parenchyma, can be seen with small vessel or small airways disease. 3. Mild mediastinal adenopathy, improved from prior exam, likely reactive. 4. No acute abnormality in the abdomen/pelvis. 5. Moderate to large volume of stool throughout the colon, can be seen with constipation. Mild colonic diverticulosis without diverticulitis. 6. Sequela of chronic renal disease with marked renal atrophy and renal vascular calcifications. 7. Prominent bilateral inguinal and left external iliac nodes, likely reactive but nonspecific. These are similar to prior exam. Aortic Atherosclerosis (ICD10-I70.0). Electronically Signed   By: Keith Rake M.D.   On: 04/28/2022 19:45   CT HEAD WO CONTRAST (5MM)  Result Date: 04/28/2022 CLINICAL DATA:  Altered mental status EXAM: CT HEAD WITHOUT CONTRAST TECHNIQUE: Contiguous axial images were obtained from the base of the skull through the vertex without intravenous contrast. RADIATION DOSE REDUCTION: This exam was performed according to the departmental dose-optimization program which includes automated exposure control, adjustment of the mA and/or kV according to patient size and/or use of iterative reconstruction technique. COMPARISON:  04/29/2021 FINDINGS: Brain: There is no mass, hemorrhage or extra-axial collection. The size and configuration of the ventricles and extra-axial CSF spaces are normal. The brain parenchyma is normal, without acute or chronic infarction. Vascular: No abnormal hyperdensity of the major intracranial arteries or  dural venous sinuses. No intracranial atherosclerosis. Skull: The visualized skull base, calvarium and extracranial soft tissues are normal. Sinuses/Orbits: No fluid levels or advanced mucosal thickening of the visualized paranasal sinuses. No mastoid or middle ear effusion. The orbits are normal. IMPRESSION: Normal head CT. Electronically Signed   By: Ulyses Jarred M.D.   On: 04/28/2022 19:31   DG Chest Port 1 View  Result Date:  04/28/2022 CLINICAL DATA:  Fever, body aches EXAM: PORTABLE CHEST 1 VIEW COMPARISON:  12/02/2021 FINDINGS: Single frontal view of the chest demonstrates stable enlarged cardiac silhouette. Right internal jugular dialysis catheter unchanged. There is increased central vascular congestion, with diffuse interstitial and ground-glass opacities consistent with edema. Trace bilateral effusions. No pneumothorax. Stable vascular stent in the left subclavian distribution. No acute bony abnormality. IMPRESSION: 1. Findings most consistent with mild congestive heart failure and edema. Superimposed infection would be difficult to exclude. Electronically Signed   By: Randa Ngo M.D.   On: 04/28/2022 18:32     Medications:    sodium chloride 10 mL/hr at 04/29/22 3154   anticoagulant sodium citrate      amLODipine  10 mg Oral Daily   aspirin EC  81 mg Oral Daily   butalbital-acetaminophen-caffeine  2 tablet Oral Once   Chlorhexidine Gluconate Cloth  6 each Topical Q0600   feeding supplement (NEPRO CARB STEADY)  237 mL Oral BID BM   gabapentin  100 mg Oral TID   heparin  5,000 Units Subcutaneous Q12H   insulin aspart  0-6 Units Subcutaneous TID WC   lipase/protease/amylase  36,000 Units Oral TID WC   mirtazapine  7.5 mg Oral QHS   multivitamin  1 tablet Oral Daily   pantoprazole  40 mg Oral Daily   sevelamer carbonate  800 mg Oral TID WC   sodium chloride flush  3 mL Intravenous Q12H   acetaminophen **OR** acetaminophen, albuterol, alteplase, anticoagulant sodium citrate, heparin, hydrALAZINE, HYDROcodone-acetaminophen, lidocaine (PF), lidocaine-prilocaine, nitroGLYCERIN, pentafluoroprop-tetrafluoroeth, sevelamer carbonate **AND** sevelamer carbonate  Assessment/ Plan:  [name] is a 61 y.o.  female with past medical history of anemia, asthma, diabetes, CAD, PAD, GERD, and end stage renal disease on dialysis. Patient present to ED with fever that began yesterday. She has been admitted for Fever  [R50.9]   End stage renal disease on hemodialysis. Will receive scheduled dialysis treatment later today. UF goal 1-1.5L as tolerated. Next treatment scheduled for Thursday.   2. Anemia of chronic kidney disease Lab Results  Component Value Date   HGB 8.0 (L) 04/29/2022    Hgb below target. Winfield Rast in outpatient clinic.   3. Secondary Hyperparathyroidism: with outpatient labs: PTH 736, phosphorus 5.3, calcium 8.6 on 04/01/22.   Lab Results  Component Value Date   PTH 357 (H) 10/05/2018   CALCIUM 7.9 (L) 04/29/2022   CAION 1.09 (L) 04/13/2020   PHOS 4.9 (H) 12/03/2021    Calcium decreased, phosphorus within acceptable range. Continue sevelamer with  meals.   4. Diabetes mellitus type II with chronic kidney disease: noninsulin dependent. Most recent hemoglobin A1c is 6.2 on 11/15/21.      LOS: 1   8/8/20233:58 PM

## 2022-04-29 NOTE — ED Notes (Signed)
Pt continues to rest peacefully with no needs

## 2022-04-29 NOTE — Progress Notes (Signed)
Pre HD RN assessment 

## 2022-04-29 NOTE — ED Notes (Signed)
Pt is resting. No needs.

## 2022-04-29 NOTE — Progress Notes (Signed)
Pt HD tx complete. Start 1350 End 1720 1.5L fluid removed 77.8L BVP 77.4 kg post weight standing.  Pt alert, no c/o, vss. Report to primary RN. Pt being moved from ED/HD to 126.

## 2022-04-29 NOTE — ED Notes (Signed)
Pt to dialysis.

## 2022-04-29 NOTE — H&P (Signed)
History and Physical    [name] RXV:[id number] DOB: 1960/12/10 DOA: 04/28/2022  PCP: Denton Lank, MD    Patient coming from:  Home    Chief Complaint:  Fever    HPI:  [name] is a 61 y.o. female seen in ed with complaints of fever with a temperature of 102.4 and O2 sats of 94% blood pressure 126/59 with a fasting blood sugar of 165 as noted per initial EMS evaluation.  Patient is a hemodialysis on Tuesdays Thursdays and Saturdays, patient comes to [place] with fever. Patient is very sleepy and cooperative and oriented. She does tell me that she was in her normal state of health this morning and feeding her birds.  She denies any respiratory symptoms or chest pain denies abdominal pain vomiting diarrhea does not make urine.  Does complain of headache no visual change or neck pain.  Pt has past medical history of anemia, chronic diastolic congestive heart failure, COPD, depression, diabetes mellitus type 2, end-stage renal disease on hemodialysis, history of UTI, GERD, history of heart disease, allergy to 12 medications selected, platelets 10 review.  ED Course:   Vitals:   04/28/22 2200 04/29/22 0000 04/29/22 0010 04/29/22 0100  BP: (!) 131/50 (!) 135/53  126/63  Pulse: 75 70  70  Resp: '15 15  15  '$ Temp:   98.7 F (37.1 C)   TempSrc:   Oral   SpO2: 100% 100%  100%  Weight:      Height:      In ED pt is somnolent but wakes up and answers questions.  CMP shows creatinine of 5.79 otherwise normal LFTs normal electrolytes normal serum potassium and bicarb. BNP elevated at 663.3, CPK of 35, troponin of 15, lactic acid of 1.6 with a repeat of 1.4. CBC shows normal white count hemoglobin of 8.8 and platelet count of 176. Free T40.90 TSH of 2.614.   Review of Systems:  Review of Systems  Unable to perform ROS: Mental acuity (Somnolent.)      Past Medical History:  Diagnosis Date   Anemia    Anginal pain ([place])    Anxiety    Arthritis    Asthma    Broken wrist     Bronchitis    chronic diastolic CHF 01/28/3266   Chronic kidney disease    COPD (chronic obstructive pulmonary disease) ([place])    Coronary artery disease    a. cath 2013: stenting to RCA (report not available); b. cath 2014: LM nl, pLAD 40%, mLAD nl, ost LCx 40%, mid LCx nl, pRCA 30% @ site of prior stent, mRCA 50%   Depression    Diabetes mellitus ([place])    Diabetes mellitus without complication ([place])    Diabetic neuropathy ([place])    dialysis 2006   Diverticulosis    Dizziness    Dyspnea    Elevated lipids    Environmental and seasonal allergies    ESRD (end stage renal disease) on dialysis ([place])    M-W-F   Gastroparesis    GERD (gastroesophageal reflux disease)    Headache    History of anemia due to chronic kidney disease    History of hiatal hernia    HOH (hard of hearing)    Hx of pancreatitis 2015   Hypertension    Lower extremity edema    Mitral regurgitation    a. echo 10/2013: EF 62%, noWMA, mildly dilated LA, mild to mod MR/TR, GR1DD   Myocardial infarction ([place])    Orthopnea  Parathyroid abnormality (H[facility])    Peripheral arterial disease (H[facility])    Pneumonia    Renal cancer ([place])    Renal insufficiency    Pt is on dialysis on M,W + F.   Wheezing     Past Surgical History:  Procedure Laterality Date   A/V SHUNTOGRAM Left 01/20/2018   Procedure: A/V SHUNTOGRAM;  Surgeon: Algernon Huxley, MD;  Location: [facility];  Service: Cardiovascular;  Laterality: Left;   ABDOMINAL HYSTERECTOMY  1992   AMPUTATION TOE Left 10/02/2017   Procedure: AMPUTATION TOE-LEFT GREAT TOE;  Surgeon: Albertine Patricia, DPM;  Location: [facility];  Service: Podiatry;  Laterality: Left;   APPENDECTOMY     APPLICATION OF WOUND VAC N/A 11/25/2019   Procedure: APPLICATION OF WOUND VAC;  Surgeon: Katha Cabal, MD;  Location: [facility];  Service: Vascular;  Laterality: N/A;   ARTERY BIOPSY Right 10/11/2018   Procedure: BIOPSY TEMPORAL ARTERY;  Surgeon: Vickie Epley, MD;  Location:  [facility];  Service: General;  Laterality: Right;   BREAST CYST EXCISION     CARDIAC CATHETERIZATION Left 07/26/2015   Procedure: Left Heart Cath and Coronary Angiography;  Surgeon: Dionisio David, MD;  Location: [facility];  Service: Cardiovascular;  Laterality: Left;   CATARACT EXTRACTION W/ INTRAOCULAR LENS IMPLANT Right    CATARACT EXTRACTION W/PHACO Left 03/10/2017   Procedure: CATARACT EXTRACTION PHACO AND INTRAOCULAR LENS PLACEMENT (IOC);  Surgeon: Birder Robson, MD;  Location: [facility];  Service: Ophthalmology;  Laterality: Left;  [place] 00:51.9 AP% 14.2 CDE 7.39 fluid pack lot # [id number]   CENTRAL LINE INSERTION Right 11/11/2019   Procedure: CENTRAL LINE INSERTION;  Surgeon: Katha Cabal, MD;  Location: [facility];  Service: Vascular;  Laterality: Right;   CENTRAL LINE INSERTION  11/25/2019   Procedure: CENTRAL LINE INSERTION;  Surgeon: Katha Cabal, MD;  Location: [facility];  Service: Vascular;;   CHOLECYSTECTOMY     COLONOSCOPY WITH PROPOFOL N/A 08/12/2016   Procedure: COLONOSCOPY WITH PROPOFOL;  Surgeon: Lollie Sails, MD;  Location: [facility];  Service: Endoscopy;  Laterality: N/A;   DIALYSIS FISTULA CREATION Left    upper arm   dialysis grafts     DIALYSIS/PERMA CATHETER INSERTION N/A 11/14/2019   Procedure: DIALYSIS/PERMA CATHETER INSERTION;  Surgeon: Algernon Huxley, MD;  Location: [facility];  Service: Cardiovascular;  Laterality: N/A;   DIALYSIS/PERMA CATHETER INSERTION N/A 02/03/2020   Procedure: DIALYSIS/PERMA CATHETER INSERTION;  Surgeon: Katha Cabal, MD;  Location: [place] [facility];  Service: Cardiovascular;  Laterality: N/A;   DIALYSIS/PERMA CATHETER INSERTION N/A 06/19/2020   Procedure: DIALYSIS/PERMA CATHETER INSERTION;  Surgeon: Katha Cabal, MD;  Location: [facility];  Service: Cardiovascular;  Laterality: N/A;   DIALYSIS/PERMA CATHETER INSERTION N/A 05/07/2021   Procedure: DIALYSIS/PERMA CATHETER  INSERTION;  Surgeon: Katha Cabal, MD;  Location: [place] [facility];  Service: Cardiovascular;  Laterality: N/A;  Patient needs general anethesia   DIALYSIS/PERMA CATHETER REMOVAL N/A 05/25/2020   Procedure: DIALYSIS/PERMA CATHETER REMOVAL;  Surgeon: Katha Cabal, MD;  Location: [place] [facility] [facility];  Service: Cardiovascular;  Laterality: N/A;   ESOPHAGOGASTRODUODENOSCOPY N/A 03/08/2015   Procedure: ESOPHAGOGASTRODUODENOSCOPY (EGD);  Surgeon: Manya Silvas, MD;  Location: [facility];  Service: Endoscopy;  Laterality: N/A;   ESOPHAGOGASTRODUODENOSCOPY (EGD) WITH PROPOFOL N/A 03/18/2016   Procedure: ESOPHAGOGASTRODUODENOSCOPY (EGD) WITH PROPOFOL;  Surgeon: [name], MD;  Location: [facility];  Service: Endoscopy;  Laterality: N/A;   EYE SURGERY  Right 2018   FECAL TRANSPLANT N/A 08/23/2015   Procedure: FECAL TRANSPLANT;  Surgeon: Manya Silvas, MD;  Location: [facility] END[facility]COPY;  Service: Endoscopy;  Laterality: N/A;   HAND SURGERY Bilateral    HEMATOMA EVACUATION Left 11/25/2019   Procedure: EVACUATION HEMATOMA;  Surgeon: Katha Cabal, MD;  Location: [facility];  Service: Vascular;  Laterality: Left;   I & D EXTREMITY Left 11/25/2019   Procedure: IRRIGATION AND DEBRIDEMENT EXTREMITY;  Surgeon: Katha Cabal, MD;  Location: [facility];  Service: Vascular;  Laterality: Left;   IR FLUORO GUIDE CV LINE RIGHT  04/06/2020   IR INJECT/THERA/INC NEEDLE/CATH/PLC EPI/CERV/THOR W/IMG  08/13/2020   IR RADIOLOGIST EVAL & MGMT  07/28/2019   IR RADIOLOGIST EVAL & MGMT  08/11/2019   LIGATION OF ARTERIOVENOUS  FISTULA Left 11/11/2019   LIGATION OF ARTERIOVENOUS  FISTULA Left 11/11/2019   Procedure: LIGATION OF ARTERIOVENOUS  FISTULA;  Surgeon: Katha Cabal, MD;  Location: [facility];  Service: Vascular;  Laterality: Left;   LIGATIONS OF HERO GRAFT Right 06/13/2020   Procedure: LIGATION / REMOVAL OF RIGHT HERO GRAFT;  Surgeon: Katha Cabal, MD;  Location: [facility];   Service: Vascular;  Laterality: Right;   PERIPHERAL VASCULAR CATHETERIZATION N/A 12/20/2015   Procedure: Thrombectomy of dialysis access versus permcath placement;  Surgeon: Algernon Huxley, MD;  Location: [place] [facility];  Service: Cardiovascular;  Laterality: N/A;   PERIPHERAL VASCULAR CATHETERIZATION N/A 12/20/2015   Procedure: A/V Shunt Intervention;  Surgeon: Algernon Huxley, MD;  Location: [facility] [place] [facility];  Service: Cardiovascular;  Laterality: N/A;   PERIPHERAL VASCULAR CATHETERIZATION N/A 12/20/2015   Procedure: A/V Shuntogram/Fistulagram;  Surgeon: Algernon Huxley, MD;  Location: [place] [facility];  Service: Cardiovascular;  Laterality: N/A;   PERIPHERAL VASCULAR CATHETERIZATION N/A 01/02/2016   Procedure: A/V Shuntogram/Fistulagram;  Surgeon: Algernon Huxley, MD;  Location: [place] [facility];  Service: Cardiovascular;  Laterality: N/A;   PERIPHERAL VASCULAR CATHETERIZATION N/A 01/02/2016   Procedure: A/V Shunt Intervention;  Surgeon: Algernon Huxley, MD;  Location: [place] [facility];  Service: Cardiovascular;  Laterality: N/A;   TEE WITHOUT CARDIOVERSION N/A 06/11/2020   Procedure: TRANSESOPHAGEAL ECHOCARDIOGRAM (TEE);  Surgeon: Teodoro Spray, MD;  Location: [facility];  Service: Cardiovascular;  Laterality: N/A;   UPPER EXTREMITY ANGIOGRAPHY Bilateral 11/27/2020   Procedure: UPPER EXTREMITY ANGIOGRAPHY;  Surgeon: Katha Cabal, MD;  Location: [place] [facility];  Service: Cardiovascular;  Laterality: Bilateral;   UPPER EXTREMITY VENOGRAPHY Right 01/18/2020   Procedure: UPPER EXTREMITY VENOGRAPHY;  Surgeon: Katha Cabal, MD;  Location: [place] [facility];  Service: Cardiovascular;  Laterality: Right;   UPPER EXTREMITY VENOGRAPHY Bilateral 11/27/2020   Procedure: UPPER EXTREMITY VENOGRAPHY;  Surgeon: Katha Cabal, MD;  Location: [place] [facility];  Service: Cardiovascular;  Laterality: Bilateral;   UPPER EXTREMITY VENOGRAPHY Left 11/05/2021   Procedure: UPPER  EXTREMITY VENOGRAPHY;  Surgeon: Katha Cabal, MD;  Location: [place] [facility];  Service: Cardiovascular;  Laterality: Left;   VASCULAR ACCESS DEVICE INSERTION Right 04/13/2020   Procedure: INSERTION OF HERO VASCULAR ACCESS DEVICE (GRAFT);  Surgeon: Katha Cabal, MD;  Location: [facility];  Service: Vascular;  Laterality: Right;     reports that she has never smoked. She has never used smokeless tobacco. She reports that she does not currently use alcohol. She reports current drug use. Drug: Marijuana.  Allergies  Allergen Reactions   Ace Inhibitors Swelling and Anaphylaxis   Ativan [Lorazepam]  Other (See Comments)    Reaction:  Hallucinations and headaches   Compazine [Prochlorperazine Edisylate] Anaphylaxis, Nausea And Vomiting and Other (See Comments)    Other reaction(s): dystonia from this vs. Reglan, 23 Jul - patient relates that she takes promethazine frequently with no problems   Sumatriptan Succinate Other (See Comments)    Other reaction(s): delirium and hallucinations per [facility] records   Zofran [Ondansetron] Nausea And Vomiting    Per pt. she is allergic to zofran or will experience adverse reaction like hallucination    Losartan Nausea Only   Prochlorperazine Other (See Comments)    Reaction:  Unknown . Patient does not remember reaction but she does have vertigo and anxiety along with n and v at times. Could be used to treat any of these    Reglan [Metoclopramide] Other (See Comments)    Per patient her Dr. Evelina Bucy her off it    Scopolamine Other (See Comments)    Dizziness, also has vertigo already   Tape Rash    Plastic tape causes rash   Tapentadol Rash   Ultrasound Gel Itching    Patient states the Ultrasound gel caused itching while in skin contact and resolved when wiped away.    Family History  Problem Relation Age of Onset   Kidney disease Mother    Diabetes Mother    Cancer Father    Kidney disease Sister     Prior to Admission medications    Medication Sig Start Date End Date Taking? Authorizing Provider  acetaminophen (TYLENOL) 325 MG tablet Take 2 tablets (650 mg total) by mouth every 6 (six) hours as needed for mild pain, headache, moderate pain or fever (fever >/= 101). 12/06/21  Yes Bonnielee Haff, MD  albuterol (PROVENTIL) (2.5 MG/3ML) 0.083% nebulizer solution Take 2.5 mg by nebulization every 4 (four) hours as needed for wheezing or shortness of breath.   Yes [provider]  albuterol (VENTOLIN HFA) 108 (90 Base) MCG/ACT inhaler Inhale 2 puffs into the lungs every 4 (four) hours as needed for wheezing or shortness of breath. 11/09/19  Yes [provider]  alum & mag hydroxide-simeth (MAALOX/MYLANTA) 200-200-20 MG/5ML suspension Take 15 mLs by mouth every 6 (six) hours as needed for indigestion or heartburn. 06/25/20  Yes Enzo Bi, MD  amLODipine (NORVASC) 10 MG tablet Take 1 tablet (10 mg total) by mouth daily. 06/25/20  Yes Enzo Bi, MD  ammonium lactate (LAC-HYDRIN) 12 % lotion Apply 1 application topically 2 (two) times daily as needed for dry skin.    Yes [provider]  aspirin EC 81 MG tablet Take 81 mg by mouth daily.   Yes [provider]  atorvastatin (LIPITOR) 20 MG tablet Take 20 mg by mouth every evening.  10/24/19  Yes [provider]  calcium carbonate (TUMS - DOSED IN MG ELEMENTAL CALCIUM) 500 MG chewable tablet Chew 2.5 tablets (500 mg of elemental calcium total) by mouth every 6 (six) hours as needed for indigestion. 06/25/20  Yes Enzo Bi, MD  Cholecalciferol (VITAMIN D3) 25 MCG (1000 UT) CAPS Take 1,000 Units by mouth daily.    Yes [provider]  CREON 36644-034742 units CPEP capsule Take by mouth. 03/12/22  Yes [provider]  cyanocobalamin 1000 MCG tablet Take 1,000 mcg by mouth daily.    Yes [provider]  epoetin alfa (EPOGEN) 10000 UNIT/ML injection Inject 1 mL (10,000 Units total) into the vein every Monday, Wednesday, and Friday  with hemodialysis. 06/27/20  Yes Enzo Bi,  MD  Ferrous Sulfate (IRON) 325 (65 Fe) MG TABS Take 325 mg by mouth daily.    Yes [provider]  fluticasone (FLONASE) 50 MCG/ACT nasal spray Place 2 sprays into both nostrils daily. 10/20/21  Yes [provider]  gabapentin (NEURONTIN) 100 MG capsule Take 100 mg by mouth 3 (three) times daily.   Yes [provider]  hydrALAZINE (APRESOLINE) 100 MG tablet Take 0.5 tablets (50 mg total) by mouth 2 (two) times daily. 12/06/21  Yes Bonnielee Haff, MD  hydrALAZINE (APRESOLINE) 25 MG tablet Take 25 mg by mouth 2 (two) times daily. 04/17/22  Yes [provider]  HYDROcodone-acetaminophen (NORCO/VICODIN) 5-325 MG tablet Take 1 tablet by mouth as directed. 04/18/22  Yes [provider]  lidocaine (LIDODERM) 5 % Place 1 patch onto the skin daily. (Remove after 12 hours -- leave off for 12 hours before applying new patch)   Yes [provider]  meclizine (ANTIVERT) 25 MG tablet Take 25 mg by mouth every 6 (six) hours as needed for dizziness.   Yes [provider]  mirtazapine (REMERON) 7.5 MG tablet Take 7.5 mg by mouth daily. 04/15/21  Yes [provider]  multivitamin (RENA-VIT) TABS tablet Take 1 tablet by mouth daily.    Yes [provider]  mupirocin ointment (BACTROBAN) 2 % Apply 1 application topically daily as needed (leg rash).  11/09/19  Yes [provider]  nitroGLYCERIN (NITROSTAT) 0.4 MG SL tablet Place 0.4 mg under the tongue every 5 (five) minutes as needed for chest pain.    Yes [provider]  Nutritional Supplements (FEEDING SUPPLEMENT, NEPRO CARB STEADY,) LIQD Take 237 mLs by mouth 2 (two) times daily between meals. 12/17/20  Yes Lorella Nimrod, MD  pantoprazole (PROTONIX) 40 MG tablet Take 40 mg by mouth daily. 04/16/22  Yes [provider]  Probiotic Product (PROBIOTIC PO) Take 1 capsule by mouth daily.   Yes [provider]   promethazine (PHENERGAN) 12.5 MG tablet Take 1 tablet (12.5 mg total) by mouth every 6 (six) hours as needed for nausea or vomiting. 05/10/21  Yes Wouk, Ailene Rud, MD  sevelamer carbonate (RENVELA) 800 MG tablet Take 800 mg by mouth 4 (four) times daily. (Take with meals and snack)   Yes [provider]  guaiFENesin-dextromethorphan (ROBITUSSIN DM) 100-10 MG/5ML syrup Take 10 mLs by mouth every 4 (four) hours as needed for cough. Patient not taking: Reported on 04/28/2022 12/06/21   Bonnielee Haff, MD  pantoprazole (PROTONIX) 40 MG tablet Take 1 tablet (40 mg total) by mouth 2 (two) times daily. 11/17/21 12/17/21  Sharen Hones, MD  polyethylene glycol (MIRALAX / GLYCOLAX) 17 g packet Take 17 g by mouth daily as needed. Patient taking differently: Take 17 g by mouth daily as needed for mild constipation or moderate constipation. 04/02/21   Nolberto Hanlon, MD    Physical Exam: Vitals:   04/28/22 2200 04/29/22 0000 04/29/22 0010 04/29/22 0100  BP: (!) 131/50 (!) 135/53  126/63  Pulse: 75 70  70  Resp: '15 15  15  '$ Temp:   98.7 F (37.1 C)   TempSrc:   Oral   SpO2: 100% 100%  100%  Weight:      Height:       Physical Exam Vitals reviewed.  Constitutional:      General: She is not in acute distress. HENT:     Head: Normocephalic and atraumatic.     Right Ear: External ear normal.     Left  Ear: External ear normal.     Nose: Nose normal.     Mouth/Throat:     Mouth: Mucous membranes are moist.  Eyes:     Extraocular Movements: Extraocular movements intact.     Pupils: Pupils are equal, round, and reactive to light.  Cardiovascular:     Rate and Rhythm: Normal rate. Rhythm irregular.     Pulses: Normal pulses.     Heart sounds: Normal heart sounds.  Pulmonary:     Effort: Pulmonary effort is normal.     Breath sounds: Normal breath sounds.  Abdominal:     General: Bowel sounds are normal. There is no distension.     Palpations: Abdomen is soft.     Tenderness: There is  abdominal tenderness.  Musculoskeletal:     Right lower leg: No edema.     Left lower leg: No edema.  Skin:    General: Skin is warm.  Neurological:     General: No focal deficit present.     Mental Status: She is oriented to person, place, and time.   Labs on Admission: I have personally reviewed following labs and imaging studies BMET Recent Labs  Lab 04/28/22 1759  NA 137  K 5.1  CL 95*  CO2 28  BUN 37*  CREATININE 5.79*  GLUCOSE 90   Electrolytes Recent Labs  Lab 04/28/22 1759  CALCIUM 8.7*   Sepsis Markers Recent Labs  Lab 04/28/22 1759 04/28/22 2108  LATICACIDVEN 1.6 1.4   ABG No results for input(s): "PHART", "PCO2ART", "PO2ART" in the last 168 hours. Liver Enzymes Recent Labs  Lab 04/28/22 1759  AST 24  ALT 17  ALKPHOS 91  BILITOT 0.6  ALBUMIN 3.9   Cardiac Enzymes No results for input(s): "TROPONINI", "PROBNP" in the last 168 hours. No results found for: "DDIMER" Coag's Recent Labs  Lab 04/28/22 1759  INR 1.0    Recent Results (from the past 240 hour(s))  SARS Coronavirus 2 by RT PCR (hospital order, performed in [facility] hospital lab) *cepheid single result test* Anterior Nasal Swab     Status: None   Collection Time: 04/28/22  8:09 PM   Specimen: Anterior Nasal Swab  Result Value Ref Range Status   SARS Coronavirus 2 by RT PCR NEGATIVE NEGATIVE Final    Comment: (NOTE) SARS-CoV-2 target nucleic acids are NOT DETECTED.  The SARS-CoV-2 RNA is generally detectable in upper and lower respiratory specimens during the acute phase of infection. The lowest concentration of SARS-CoV-2 viral copies this assay can detect is 250 copies / mL. A negative result does not preclude SARS-CoV-2 infection and should not be used as the sole basis for treatment or other patient management decisions.  A negative result may occur with improper specimen collection / handling, submission of specimen other than nasopharyngeal swab, presence of viral  mutation(s) within the areas targeted by this assay, and inadequate number of viral copies (<250 copies / mL). A negative result must be combined with clinical observations, patient history, and epidemiological information.  Fact Sheet for Patients:   https://www..info/  Fact Sheet for Healthcare Providers: https://hall.com/  This test is not yet approved or  cleared by the [place] FDA and has been authorized for detection and/or diagnosis of SARS-CoV-2 by FDA under an Emergency Use Authorization (EUA).  This EUA will remain in effect (meaning this test can be used) for the duration of the COVID-19 declaration under Section 564(b)(1) of the Act, 21 U.S.C. section 360bbb-3(b)(1), unless the authorization is  terminated or revoked sooner.  Performed at [facility], [address] [place], [place] [address]      Current Facility-Administered Medications:    0.9 %  sodium chloride infusion, , Intravenous, Continuous, Posey Pronto, Gretta Cool, MD   acetaminophen (TYLENOL) tablet 650 mg, 650 mg, Oral, Q6H PRN **OR** acetaminophen (TYLENOL) suppository 650 mg, 650 mg, Rectal, Q6H PRN, Para Skeans, MD   albuterol (PROVENTIL) (2.5 MG/3ML) 0.083% nebulizer solution 2.5 mg, 2.5 mg, Nebulization, Q4H PRN, Posey Pronto, Gretta Cool, MD   amLODipine (NORVASC) tablet 10 mg, 10 mg, Oral, Daily, Harleyquinn Gasser V, MD   ceFEPIme (MAXIPIME) 1 g in sodium chloride 0.9 % 100 mL IVPB, 1 g, Intravenous, Q24H, Stellan Vick V, MD   heparin injection 5,000 Units, 5,000 Units, Subcutaneous, Q12H, Para Skeans, MD, 5,000 Units at 04/29/22 0058   hydrALAZINE (APRESOLINE) injection 10 mg, 10 mg, Intravenous, Q6H PRN, Para Skeans, MD   HYDROcodone-acetaminophen (NORCO/VICODIN) 5-325 MG per tablet 1 tablet, 1 tablet, Oral, Q4H PRN, Posey Pronto, Gretta Cool, MD   insulin aspart (novoLOG) injection 0-6 Units, 0-6 Units, Subcutaneous, TID WC, Oney Tatlock, Gretta Cool, MD   metroNIDAZOLE (FLAGYL)  IVPB 500 mg, 500 mg, Intravenous, Q12H, Para Skeans, MD, Stopped at 04/28/22 2353   mirtazapine (REMERON) tablet 7.5 mg, 7.5 mg, Oral, QHS, Sheila Gervasi V, MD   morphine (PF) 2 MG/ML injection 2 mg, 2 mg, Intravenous, Q4H PRN, Para Skeans, MD   multivitamin (RENA-VIT) tablet 1 tablet, 1 tablet, Oral, Daily, Posey Pronto, Gretta Cool, MD   nitroGLYCERIN (NITROSTAT) SL tablet 0.4 mg, 0.4 mg, Sublingual, Q5 min PRN, Para Skeans, MD   sevelamer carbonate (RENVELA) tablet 800 mg, 800 mg, Oral, TID WC **AND** sevelamer carbonate (RENVELA) tablet 800 mg, 800 mg, Oral, PRN, Belue, Nathan S, RPH   sodium chloride flush (NS) 0.9 % injection 3 mL, 3 mL, Intravenous, Q12H, Para Skeans, MD  Current Outpatient Medications:    acetaminophen (TYLENOL) 325 MG tablet, Take 2 tablets (650 mg total) by mouth every 6 (six) hours as needed for mild pain, headache, moderate pain or fever (fever >/= 101)., Disp: , Rfl:    albuterol (PROVENTIL) (2.5 MG/3ML) 0.083% nebulizer solution, Take 2.5 mg by nebulization every 4 (four) hours as needed for wheezing or shortness of breath., Disp: , Rfl:    albuterol (VENTOLIN HFA) 108 (90 Base) MCG/ACT inhaler, Inhale 2 puffs into the lungs every 4 (four) hours as needed for wheezing or shortness of breath., Disp: , Rfl:    alum & mag hydroxide-simeth (MAALOX/MYLANTA) 200-200-20 MG/5ML suspension, Take 15 mLs by mouth every 6 (six) hours as needed for indigestion or heartburn., Disp: 355 mL, Rfl: 0   amLODipine (NORVASC) 10 MG tablet, Take 1 tablet (10 mg total) by mouth daily., Disp: , Rfl:    ammonium lactate (LAC-HYDRIN) 12 % lotion, Apply 1 application topically 2 (two) times daily as needed for dry skin. , Disp: , Rfl:    aspirin EC 81 MG tablet, Take 81 mg by mouth daily., Disp: , Rfl:    atorvastatin (LIPITOR) 20 MG tablet, Take 20 mg by mouth every evening. , Disp: , Rfl:    calcium carbonate (TUMS - DOSED IN MG ELEMENTAL CALCIUM) 500 MG chewable tablet, Chew 2.5 tablets (500 mg of  elemental calcium total) by mouth every 6 (six) hours as needed for indigestion., Disp: , Rfl:    Cholecalciferol (VITAMIN D3) 25 MCG (1000 UT) CAPS, Take 1,000 Units by mouth daily. ,  Disp: , Rfl:    CREON 36000-114000 units CPEP capsule, Take by mouth., Disp: , Rfl:    cyanocobalamin 1000 MCG tablet, Take 1,000 mcg by mouth daily. , Disp: , Rfl:    epoetin alfa (EPOGEN) 10000 UNIT/ML injection, Inject 1 mL (10,000 Units total) into the vein every Monday, Wednesday, and Friday with hemodialysis., Disp: 1 mL, Rfl:    Ferrous Sulfate (IRON) 325 (65 Fe) MG TABS, Take 325 mg by mouth daily. , Disp: , Rfl:    fluticasone (FLONASE) 50 MCG/ACT nasal spray, Place 2 sprays into both nostrils daily., Disp: , Rfl:    gabapentin (NEURONTIN) 100 MG capsule, Take 100 mg by mouth 3 (three) times daily., Disp: , Rfl:    hydrALAZINE (APRESOLINE) 100 MG tablet, Take 0.5 tablets (50 mg total) by mouth 2 (two) times daily., Disp: 60 tablet, Rfl: 1   hydrALAZINE (APRESOLINE) 25 MG tablet, Take 25 mg by mouth 2 (two) times daily., Disp: , Rfl:    HYDROcodone-acetaminophen (NORCO/VICODIN) 5-325 MG tablet, Take 1 tablet by mouth as directed., Disp: , Rfl:    lidocaine (LIDODERM) 5 %, Place 1 patch onto the skin daily. (Remove after 12 hours -- leave off for 12 hours before applying new patch), Disp: , Rfl:    meclizine (ANTIVERT) 25 MG tablet, Take 25 mg by mouth every 6 (six) hours as needed for dizziness., Disp: , Rfl:    mirtazapine (REMERON) 7.5 MG tablet, Take 7.5 mg by mouth daily., Disp: , Rfl:    multivitamin (RENA-VIT) TABS tablet, Take 1 tablet by mouth daily. , Disp: , Rfl:    mupirocin ointment (BACTROBAN) 2 %, Apply 1 application topically daily as needed (leg rash). , Disp: , Rfl:    nitroGLYCERIN (NITROSTAT) 0.4 MG SL tablet, Place 0.4 mg under the tongue every 5 (five) minutes as needed for chest pain. , Disp: , Rfl:    Nutritional Supplements (FEEDING SUPPLEMENT, NEPRO CARB STEADY,) LIQD, Take 237 mLs by  mouth 2 (two) times daily between meals., Disp: , Rfl: 0   pantoprazole (PROTONIX) 40 MG tablet, Take 40 mg by mouth daily., Disp: , Rfl:    Probiotic Product (PROBIOTIC PO), Take 1 capsule by mouth daily., Disp: , Rfl:    promethazine (PHENERGAN) 12.5 MG tablet, Take 1 tablet (12.5 mg total) by mouth every 6 (six) hours as needed for nausea or vomiting., Disp: 30 tablet, Rfl: 0   sevelamer carbonate (RENVELA) 800 MG tablet, Take 800 mg by mouth 4 (four) times daily. (Take with meals and snack), Disp: , Rfl:    guaiFENesin-dextromethorphan (ROBITUSSIN DM) 100-10 MG/5ML syrup, Take 10 mLs by mouth every 4 (four) hours as needed for cough. (Patient not taking: Reported on 04/28/2022), Disp: 118 mL, Rfl: 0   pantoprazole (PROTONIX) 40 MG tablet, Take 1 tablet (40 mg total) by mouth 2 (two) times daily., Disp: 60 tablet, Rfl: 0   polyethylene glycol (MIRALAX / GLYCOLAX) 17 g packet, Take 17 g by mouth daily as needed. (Patient taking differently: Take 17 g by mouth daily as needed for mild constipation or moderate constipation.), Disp: , Rfl:   COVID-19 Labs No results for input(s): "DDIMER", "FERRITIN", "LDH", "CRP" in the last 72 hours. Lab Results  Component Value Date   SARSCOV2NAA NEGATIVE 04/28/2022   SARSCOV2NAA POSITIVE (A) 11/27/2021   [place] NEGATIVE 11/15/2021   [place] NEGATIVE 06/04/2021    Radiological Exams on Admission: CT CHEST ABDOMEN PELVIS WO CONTRAST  Result Date: 04/28/2022 CLINICAL DATA:  Pneumonia, complication suspected,  xray done EXAM: CT CHEST, ABDOMEN AND PELVIS WITHOUT CONTRAST TECHNIQUE: Multidetector CT imaging of the chest, abdomen and pelvis was performed following the standard protocol without IV contrast. RADIATION DOSE REDUCTION: This exam was performed according to the departmental dose-optimization program which includes automated exposure control, adjustment of the mA and/or kV according to patient size and/or use of iterative reconstruction technique.  COMPARISON:  Radiograph earlier today. Chest CT 12/03/2021. Abdominopelvic CT 11/15/2021 FINDINGS: CT CHEST FINDINGS Cardiovascular: Right-sided dialysis catheter tip in the right atrium. Mild cardiomegaly with coronary artery calcifications. Aortic atherosclerosis. Mild dilatation of the main pulmonary artery at 3.2 cm. No pericardial effusion. Left subclavian stent in place. Multiple vascular stents in the forearm. There are mild chest wall collaterals. Mediastinum/Nodes: Multifocal mediastinal adenopathy with improvement from prior exam. For example AP window node measures 8 mm short axis previously 11 mm. Anterior lower paratracheal node measures 11 mm, previously 14 mm. Patulous esophagus. No visible thyroid nodule. Lungs/Pleura: Small right and trace left pleural effusion, diminished in size from prior CT. There is associated atelectasis in the lung bases. Mild heterogeneous pulmonary parenchyma. Occasional areas of subpleural reticulation in both lungs. No confluent consolidation. Unremarkable appearance of the trachea and central bronchi. Musculoskeletal: No acute osseous findings. Mild diffuse sclerosis and ground-glass opacities the osseous structures consistent with renal osteodystrophy. Chronic areas of endplate sclerosis and irregularity at the T7-T8, unchanged. CT ABDOMEN PELVIS FINDINGS Hepatobiliary: No focal hepatic abnormality on this unenhanced exam. Clips in the gallbladder fossa postcholecystectomy. No biliary dilatation. Pancreas: Mild fatty atrophy.  No ductal dilatation or inflammation. Spleen: Normal in size without focal abnormality. Adrenals/Urinary Tract: No adrenal nodule. Sequela of chronic renal disease with marked renal atrophy and renal vascular calcifications. Small simple cyst in the mid left kidney, needs no further follow-up. Suspicious renal lesion. Decompressed urinary bladder. Stomach/Bowel: Partially distended stomach. No small bowel obstruction or inflammation. Appendix is  not definitively visualized. Moderate to large volume of stool throughout the colon. Few diverticula in the right and sigmoid colon without diverticulitis. No colonic inflammation. Vascular/Lymphatic: Advanced aortic and branch atherosclerosis. No aneurysm. No portal venous gas. No enlarged lymph nodes in the abdomen. There are prominent bilateral inguinal and left external iliac nodes, largest measuring 14 mm in the low left inguinal region. Reproductive: Status post hysterectomy. No adnexal masses. There is air in the vagina, nonspecific. Other: No ascites. No free air. There is generalized body wall edema, although improved from prior exam. Musculoskeletal: No acute osseous findings. Mild generalized sclerosis and ground-glass opacity of the osseous structures consistent with renal osteodystrophy. Small Schmorl's node superior endplates of L2 and L3. IMPRESSION: 1. Small right and trace left pleural effusions, diminished in size from March CT. There is associated atelectasis in the lung bases. 2. Mild heterogeneous pulmonary parenchyma, can be seen with small vessel or small airways disease. 3. Mild mediastinal adenopathy, improved from prior exam, likely reactive. 4. No acute abnormality in the abdomen/pelvis. 5. Moderate to large volume of stool throughout the colon, can be seen with constipation. Mild colonic diverticulosis without diverticulitis. 6. Sequela of chronic renal disease with marked renal atrophy and renal vascular calcifications. 7. Prominent bilateral inguinal and left external iliac nodes, likely reactive but nonspecific. These are similar to prior exam. Aortic Atherosclerosis (ICD10-I70.0). Electronically Signed   By: Keith Rake M.D.   On: 04/28/2022 19:45   CT HEAD WO CONTRAST (5MM)  Result Date: 04/28/2022 CLINICAL DATA:  Altered mental status EXAM: CT HEAD WITHOUT CONTRAST TECHNIQUE: Contiguous axial images  were obtained from the base of the skull through the vertex without  intravenous contrast. RADIATION DOSE REDUCTION: This exam was performed according to the departmental dose-optimization program which includes automated exposure control, adjustment of the mA and/or kV according to patient size and/or use of iterative reconstruction technique. COMPARISON:  04/29/2021 FINDINGS: Brain: There is no mass, hemorrhage or extra-axial collection. The size and configuration of the ventricles and extra-axial CSF spaces are normal. The brain parenchyma is normal, without acute or chronic infarction. Vascular: No abnormal hyperdensity of the major intracranial arteries or dural venous sinuses. No intracranial atherosclerosis. Skull: The visualized skull base, calvarium and extracranial soft tissues are normal. Sinuses/Orbits: No fluid levels or advanced mucosal thickening of the visualized paranasal sinuses. No mastoid or middle ear effusion. The orbits are normal. IMPRESSION: Normal head CT. Electronically Signed   By: Ulyses Jarred M.D.   On: 04/28/2022 19:31   DG Chest Port 1 View  Result Date: 04/28/2022 CLINICAL DATA:  Fever, body aches EXAM: PORTABLE CHEST 1 VIEW COMPARISON:  12/02/2021 FINDINGS: Single frontal view of the chest demonstrates stable enlarged cardiac silhouette. Right internal jugular dialysis catheter unchanged. There is increased central vascular congestion, with diffuse interstitial and ground-glass opacities consistent with edema. Trace bilateral effusions. No pneumothorax. Stable vascular stent in the left subclavian distribution. No acute bony abnormality. IMPRESSION: 1. Findings most consistent with mild congestive heart failure and edema. Superimposed infection would be difficult to exclude. Electronically Signed   By: Randa Ngo M.D.   On: 04/28/2022 18:32    EKG: Independently reviewed.  A-fib at the rate of 100.  Assessment and Plan: * Fever Etiology unclear. Pt reports pain everywhere. CPK is < 35.  We will continue iv abx and follow cultures.     End-stage renal disease on hemodialysis [facility]) Nephrology consult. Dr Holley Raring.    HTN (hypertension) Vitals:   04/28/22 1751 04/28/22 2000 04/28/22 2030 04/28/22 2105  BP: (!) 163/73 (!) 147/52 (!) 141/51 (!) 130/52   04/28/22 2200 04/29/22 0000 04/29/22 0100  BP: (!) 131/50 (!) 135/53 126/63  Patient continued on amlodipine, as needed hydralazine.    Diabetes mellitus, type II ([place]) Patient's last A1c was 6.2 about 5 months ago. Medical history no antihyperglycemic agents in chart. Glycemic protocol.  Chronic diastolic CHF (congestive heart failure) (HCC) Patient's breathing is stable no distress. Again volume management per hemodialysis.  DVT prophylaxis:  Heparin  Code Status:  Full code  Family Communication:  Newburn,Carl (Son)  [phone] (Home Phone)   Disposition Plan:  Home  Consults called:  Nephrology  Admission status: Inpatient  Para Skeans MD [facility]  6 PM- 2 AM. Please contact me via secure Chat 6 PM-2 AM. [phone] ( Pager ) To contact the [facility] Attending or Consulting provider [place] or covering provider during after hours [place], for this patient.   Check the care team in [facility] and look for a) attending/consulting [facility] provider listed and b) the [facility] team listed Log into [email or web address] and use [facility]'s universal password to access. If you do not have the password, please contact the hospital operator. Locate the [facility] provider you are looking for under [facility] and page to a number that you can be directly reached. If you still have difficulty reaching the provider, please page the [facility] (Director on Call) for the Hospitalists listed on amion for assistance. [email or web address] 04/29/2022, 1:59 AM

## 2022-04-29 NOTE — Progress Notes (Signed)
[name] is a 61 y.o., female with past medical history of anemia, asthma, diabetes, CAD, PAD, and end stage renal disease on dialysis (with [facility]) who presented to ED with fever and feeling unwell, no other symptoms.  Temp 103.1 on presentation, WBC 11.6.  CT c/a/p showed no acute finding to indicate infection.  COVID and RVP neg.  UA was not obtained and pt making very little urine.  Initial blood cx x2 neg so far.  Currently no source of infection, however, procal elevated to 4.45.  Pt received IV vanc, cefepime and flagyl on presentation, which were not continued since there is no source of infection.  I have asked nephrology to obtain a blood cx from the Perm[facility] site.  No charge note.

## 2022-04-29 NOTE — Assessment & Plan Note (Signed)
Etiology unclear. Pt reports pain everywhere. CPK is < 35.  We will continue iv abx and follow cultures.

## 2022-04-29 NOTE — Progress Notes (Signed)
Patient admitted from dialysis. Report received from dialysis nurse. Upon arrival to unit, CBG 66. Patient given sandwich box and dinner tray ordered. Pending repeat CBG.

## 2022-04-29 NOTE — Progress Notes (Signed)
Post HD RN assessment 

## 2022-04-29 NOTE — Progress Notes (Signed)
Post HD vitals

## 2022-04-29 NOTE — Assessment & Plan Note (Signed)
Patient's breathing is stable no distress. Again volume management per hemodialysis.

## 2022-04-29 NOTE — Assessment & Plan Note (Signed)
Patient's last A1c was 6.2 about 5 months ago. Medical history no antihyperglycemic agents in chart. Glycemic protocol.

## 2022-04-29 NOTE — Assessment & Plan Note (Signed)
Nephrology consult. Dr Holley Raring.

## 2022-04-30 DIAGNOSIS — R509 Fever, unspecified: Secondary | ICD-10-CM | POA: Diagnosis not present

## 2022-04-30 DIAGNOSIS — S91109A Unspecified open wound of unspecified toe(s) without damage to nail, initial encounter: Secondary | ICD-10-CM

## 2022-04-30 DIAGNOSIS — Z992 Dependence on renal dialysis: Secondary | ICD-10-CM

## 2022-04-30 DIAGNOSIS — N186 End stage renal disease: Secondary | ICD-10-CM

## 2022-04-30 LAB — CBC
HCT: 23.8 % — ABNORMAL LOW (ref 36.0–46.0)
Hemoglobin: 7.9 g/dL — ABNORMAL LOW (ref 12.0–15.0)
MCH: 31.1 pg (ref 26.0–34.0)
MCHC: 33.2 g/dL (ref 30.0–36.0)
MCV: 93.7 fL (ref 80.0–100.0)
Platelets: 139 10*3/uL — ABNORMAL LOW (ref 150–400)
RBC: 2.54 MIL/uL — ABNORMAL LOW (ref 3.87–5.11)
RDW: 14.8 % (ref 11.5–15.5)
WBC: 7 10*3/uL (ref 4.0–10.5)
nRBC: 0 % (ref 0.0–0.2)

## 2022-04-30 LAB — MAGNESIUM: Magnesium: 1.8 mg/dL (ref 1.7–2.4)

## 2022-04-30 LAB — BASIC METABOLIC PANEL
Anion gap: 9 (ref 5–15)
BUN: 22 mg/dL (ref 8–23)
CO2: 28 mmol/L (ref 22–32)
Calcium: 8 mg/dL — ABNORMAL LOW (ref 8.9–10.3)
Chloride: 97 mmol/L — ABNORMAL LOW (ref 98–111)
Creatinine, Ser: 4.07 mg/dL — ABNORMAL HIGH (ref 0.44–1.00)
GFR, Estimated: 12 mL/min — ABNORMAL LOW (ref >60)
Glucose, Bld: 97 mg/dL (ref 70–99)
Potassium: 4.7 mmol/L (ref 3.5–5.1)
Sodium: 134 mmol/L — ABNORMAL LOW (ref 135–145)

## 2022-04-30 LAB — GLUCOSE, CAPILLARY
Glucose-Capillary: 97 mg/dL (ref 70–99)
Glucose-Capillary: 124 mg/dL — ABNORMAL HIGH (ref 70–99)
Glucose-Capillary: 146 mg/dL — ABNORMAL HIGH (ref 70–99)
Glucose-Capillary: 153 mg/dL — ABNORMAL HIGH (ref 70–99)

## 2022-04-30 LAB — HEPATITIS B SURFACE ANTIBODY, QUANTITATIVE: Hep B S AB Quant (Post): 838.3 m[IU]/mL (ref >9.9)

## 2022-04-30 MED ORDER — HEPARIN SODIUM (PORCINE) 1000 UNIT/ML IJ SOLN
3000.0000 [IU] | Freq: Once | INTRAMUSCULAR | Status: AC
  Administered 2022-04-30: 3000 [IU]
  Filled 2022-04-30: qty 3

## 2022-04-30 MED ORDER — SODIUM CHLORIDE 0.9 % IV SOLN
12.5000 mg | Freq: Once | INTRAVENOUS | Status: AC
  Administered 2022-04-30: 12.5 mg via INTRAVENOUS
  Filled 2022-04-30: qty 0.5

## 2022-04-30 MED ORDER — SACCHAROMYCES BOULARDII 250 MG PO CAPS
250.0000 mg | ORAL_CAPSULE | Freq: Two times a day (BID) | ORAL | Status: DC
  Administered 2022-04-30 – 2022-05-02 (×5): 250 mg via ORAL
  Filled 2022-04-30 (×5): qty 1

## 2022-04-30 NOTE — Progress Notes (Addendum)
PROGRESS NOTE    [name]  QVZ:[id number] DOB: 04/16/1961 DOA: 04/28/2022 PCP: Denton Lank, MD    Assessment & Plan:   Principal Problem:   Fever Active Problems:   End-stage renal disease on hemodialysis ([place])   HTN (hypertension)   Diabetes mellitus, type II ([place])   Chronic diastolic CHF (congestive heart failure) ([place])  Assessment and Plan: Fever: etiology unclear, possibly secondary left great toe wound. Left great toe wound cx pending. No fever today so far. Blood cxs NGTD. UA was not obtained as pt makes little to no urine. COVID19 & viral respiratory panel are neg. Procal 4.45. Holding off on abxs currently  Left great toe wound: present on admission. Wound cx is pending. Continue w/ wound care. Hold off abxs currently    ESRD: on HD. Management as per nephro   ACD: likely secondary to ESRD. Will transfuse if Hb < 7.0   HTN: continue on amlodipine. IV hydralazine prn   DM2: well controlled, HbA1c 6.2.    Chronic diastolic CHF: volume management w/ HD       DVT prophylaxis: heparin  Code Status:  full  Family Communication:  Disposition Plan: likely d/c back home  Level of care: Med-Surg  Status is: Inpatient Remains inpatient appropriate because: severity of illness   Consultants:  Nephro   Procedures:   Antimicrobials:    Subjective: Pt c/o headache   Objective: Vitals:   04/29/22 2110 04/30/22 0500 04/30/22 0534 04/30/22 0731  BP: (!) 115/56  (!) 148/51 (!) 136/57  Pulse: 74  66 65  Resp: '16  18 17  '$ Temp: 98.3 F (36.8 C)  98 F (36.7 C) 98.6 F (37 C)  TempSrc: Oral  Oral   SpO2: 100%  100% 100%  Weight:  78.9 kg    Height:        Intake/Output Summary (Last 24 hours) at 04/30/2022 0900 Last data filed at 04/29/2022 1720 Gross per 24 hour  Intake 197.03 ml  Output 1500 ml  Net -1302.97 ml   Filed Weights   04/29/22 1338 04/29/22 1725 04/30/22 0500  Weight: 78.8 kg 77.4 kg 78.9 kg    Examination:  General exam: Appears calm  and comfortable  Respiratory system: decreased breath sounds b/l  Cardiovascular system: S1 & S2+. No rubs, gallops or clicks.  Gastrointestinal system: Abdomen is nondistended, soft and nontender. Normal bowel sounds heard. Central nervous system: Alert and oriented. Moves all extremities  Extremities: left great toe wound that is small, circular, some pus noted & erythema. Psychiatry: Judgement and insight appear normal. Flat mood and affect.     Data Reviewed: I have personally reviewed following labs and imaging studies  CBC: Recent Labs  Lab 04/28/22 1759 04/29/22 0602 04/30/22 0402  WBC 9.6 11.6* 7.0  NEUTROABS 7.5  --   --   HGB 8.8* 8.0* 7.9*  HCT 27.5* 24.6* 23.8*  MCV 94.5 96.1 93.7  PLT 176 149* 329*   Basic Metabolic Panel: Recent Labs  Lab 04/28/22 1759 04/29/22 0602 04/30/22 0402  NA 137 133* 134*  K 5.1 5.0 4.7  CL 95* 96* 97*  CO2 '28 28 28  '$ GLUCOSE 90 63* 97  BUN 37* 46* 22  CREATININE 5.79* 6.63* 4.07*  CALCIUM 8.7* 7.9* 8.0*  MG  --   --  1.8   GFR: Estimated Creatinine Clearance: 13.9 mL/min (A) (by C-G formula based on SCr of 4.07 mg/dL (H)). Liver Function Tests: Recent Labs  Lab 04/28/22 1759 04/29/22 0602  AST 24 43*  ALT 17 32  ALKPHOS 91 74  BILITOT 0.6 1.0  PROT 7.6 6.1*  ALBUMIN 3.9 2.8*   No results for input(s): "LIPASE", "AMYLASE" in the last 168 hours. No results for input(s): "AMMONIA" in the last 168 hours. Coagulation Profile: Recent Labs  Lab 04/28/22 1759  INR 1.0   Cardiac Enzymes: Recent Labs  Lab 04/28/22 1759  CKTOTAL 35*   BNP (last 3 results) No results for input(s): "PROBNP" in the last 8760 hours. HbA1C: No results for input(s): "HGBA1C" in the last 72 hours. CBG: Recent Labs  Lab 04/29/22 1037 04/29/22 1234 04/29/22 1836 04/29/22 2108 04/30/22 0732  GLUCAP 69* 79 66* 104* 97   Lipid Profile: No results for input(s): "CHOL", "HDL", "LDLCALC", "TRIG", "CHOLHDL", "LDLDIRECT" in the last 72  hours. Thyroid Function Tests: Recent Labs    04/28/22 1759  TSH 2.614  FREET4 0.90   Anemia Panel: No results for input(s): "VITAMINB12", "FOLATE", "FERRITIN", "TIBC", "IRON", "RETICCTPCT" in the last 72 hours. Sepsis Labs: Recent Labs  Lab 04/28/22 1759 04/28/22 2108 04/29/22 0602  PROCALCITON  --   --  4.45  LATICACIDVEN 1.6 1.4  --     Recent Results (from the past 240 hour(s))  Culture, blood (Routine x 2)     Status: None (Preliminary result)   Collection Time: 04/28/22  5:59 PM   Specimen: BLOOD  Result Value Ref Range Status   Specimen Description BLOOD BLOOD RIGHT FOREARM  Final   Special Requests   Final    BOTTLES DRAWN AEROBIC AND ANAEROBIC Blood Culture adequate volume   Culture   Final    NO GROWTH 2 DAYS Performed at [facility], [address] [place], [place] [address]    Report Status PENDING  Incomplete  Culture, blood (Routine x 2)     Status: None (Preliminary result)   Collection Time: 04/28/22  6:04 PM   Specimen: BLOOD  Result Value Ref Range Status   Specimen Description BLOOD BLOOD RIGHT FOREARM  Final   Special Requests   Final    BOTTLES DRAWN AEROBIC AND ANAEROBIC Blood Culture adequate volume   Culture   Final    NO GROWTH 2 DAYS Performed at [facility], [address], [place], [place] [address]    Report Status PENDING  Incomplete  SARS Coronavirus 2 by RT PCR (hospital order, performed in [facility] hospital lab) *cepheid single result test* Anterior Nasal Swab     Status: None   Collection Time: 04/28/22  8:09 PM   Specimen: Anterior Nasal Swab  Result Value Ref Range Status   SARS Coronavirus 2 by RT PCR NEGATIVE NEGATIVE Final    Comment: (NOTE) SARS-CoV-2 target nucleic acids are NOT DETECTED.  The SARS-CoV-2 RNA is generally detectable in upper and lower respiratory specimens during the acute phase of infection. The lowest concentration of SARS-CoV-2 viral copies this assay can detect is 250 copies  / mL. A negative result does not preclude SARS-CoV-2 infection and should not be used as the sole basis for treatment or other patient management decisions.  A negative result may occur with improper specimen collection / handling, submission of specimen other than nasopharyngeal swab, presence of viral mutation(s) within the areas targeted by this assay, and inadequate number of viral copies (<250 copies / mL). A negative result must be combined with clinical observations, patient history, and epidemiological information.  Fact Sheet for Patients:   https://www.patel.info/  Fact Sheet for Healthcare Providers: https://hall.com/  This test is not yet approved or  cleared by the [place] and has been authorized for detection and/or diagnosis of SARS-CoV-2 by FDA under an Emergency Use Authorization (EUA).  This EUA will remain in effect (meaning this test can be used) for the duration of the COVID-19 declaration under Section 564(b)(1) of the Act, 21 U.S.C. section 360bbb-3(b)(1), unless the authorization is terminated or revoked sooner.  Performed at [facility], [place], [place] [address]   Respiratory (~20 pathogens) panel by PCR     Status: None   Collection Time: 04/29/22  1:15 PM   Specimen: Nasopharyngeal Swab; Respiratory  Result Value Ref Range Status   Adenovirus NOT DETECTED NOT DETECTED Final   Coronavirus 229E NOT DETECTED NOT DETECTED Final    Comment: (NOTE) The Coronavirus on the Respiratory Panel, DOES NOT test for the novel  Coronavirus (2019 nCoV)    Coronavirus HKU1 NOT DETECTED NOT DETECTED Final   Coronavirus NL63 NOT DETECTED NOT DETECTED Final   Coronavirus OC43 NOT DETECTED NOT DETECTED Final   Metapneumovirus NOT DETECTED NOT DETECTED Final   Rhinovirus / Enterovirus NOT DETECTED NOT DETECTED Final   Influenza A NOT DETECTED NOT DETECTED Final   Influenza B NOT DETECTED NOT  DETECTED Final   Parainfluenza Virus 1 NOT DETECTED NOT DETECTED Final   Parainfluenza Virus 2 NOT DETECTED NOT DETECTED Final   Parainfluenza Virus 3 NOT DETECTED NOT DETECTED Final   Parainfluenza Virus 4 NOT DETECTED NOT DETECTED Final   Respiratory Syncytial Virus NOT DETECTED NOT DETECTED Final   Bordetella pertussis NOT DETECTED NOT DETECTED Final   Bordetella Parapertussis NOT DETECTED NOT DETECTED Final   Chlamydophila pneumoniae NOT DETECTED NOT DETECTED Final   Mycoplasma pneumoniae NOT DETECTED NOT DETECTED Final    Comment: Performed at [facility], [place]. [address], [place], [place] [address]  Culture, blood (Routine X 2) w Reflex to ID Panel     Status: None (Preliminary result)   Collection Time: 04/30/22  1:20 AM   Specimen: BLOOD  Result Value Ref Range Status   Specimen Description BLOOD PORTA CATH  Final   Special Requests   Final    BOTTLES DRAWN AEROBIC AND ANAEROBIC Blood Culture adequate volume   Culture   Final    NO GROWTH < 12 HOURS Performed at [facility], [address], [place], [place] [address]    Report Status PENDING  Incomplete         Radiology Studies: CT CHEST ABDOMEN PELVIS WO CONTRAST  Result Date: 04/28/2022 CLINICAL DATA:  Pneumonia, complication suspected, xray done EXAM: CT CHEST, ABDOMEN AND PELVIS WITHOUT CONTRAST TECHNIQUE: Multidetector CT imaging of the chest, abdomen and pelvis was performed following the standard protocol without IV contrast. RADIATION DOSE REDUCTION: This exam was performed according to the departmental dose-optimization program which includes automated exposure control, adjustment of the mA and/or kV according to patient size and/or use of iterative reconstruction technique. COMPARISON:  Radiograph earlier today. Chest CT 12/03/2021. Abdominopelvic CT 11/15/2021 FINDINGS: CT CHEST FINDINGS Cardiovascular: Right-sided dialysis catheter tip in the right atrium. Mild cardiomegaly with coronary artery  calcifications. Aortic atherosclerosis. Mild dilatation of the main pulmonary artery at 3.2 cm. No pericardial effusion. Left subclavian stent in place. Multiple vascular stents in the forearm. There are mild chest wall collaterals. Mediastinum/Nodes: Multifocal mediastinal adenopathy with improvement from prior exam. For example AP window node measures 8 mm short axis previously 11 mm. Anterior lower paratracheal node  measures 11 mm, previously 14 mm. Patulous esophagus. No visible thyroid nodule. Lungs/Pleura: Small right and trace left pleural effusion, diminished in size from prior CT. There is associated atelectasis in the lung bases. Mild heterogeneous pulmonary parenchyma. Occasional areas of subpleural reticulation in both lungs. No confluent consolidation. Unremarkable appearance of the trachea and central bronchi. Musculoskeletal: No acute osseous findings. Mild diffuse sclerosis and ground-glass opacities the osseous structures consistent with renal osteodystrophy. Chronic areas of endplate sclerosis and irregularity at the T7-T8, unchanged. CT ABDOMEN PELVIS FINDINGS Hepatobiliary: No focal hepatic abnormality on this unenhanced exam. Clips in the gallbladder fossa postcholecystectomy. No biliary dilatation. Pancreas: Mild fatty atrophy.  No ductal dilatation or inflammation. Spleen: Normal in size without focal abnormality. Adrenals/Urinary Tract: No adrenal nodule. Sequela of chronic renal disease with marked renal atrophy and renal vascular calcifications. Small simple cyst in the mid left kidney, needs no further follow-up. Suspicious renal lesion. Decompressed urinary bladder. Stomach/Bowel: Partially distended stomach. No small bowel obstruction or inflammation. Appendix is not definitively visualized. Moderate to large volume of stool throughout the colon. Few diverticula in the right and sigmoid colon without diverticulitis. No colonic inflammation. Vascular/Lymphatic: Advanced aortic and branch  atherosclerosis. No aneurysm. No portal venous gas. No enlarged lymph nodes in the abdomen. There are prominent bilateral inguinal and left external iliac nodes, largest measuring 14 mm in the low left inguinal region. Reproductive: Status post hysterectomy. No adnexal masses. There is air in the vagina, nonspecific. Other: No ascites. No free air. There is generalized body wall edema, although improved from prior exam. Musculoskeletal: No acute osseous findings. Mild generalized sclerosis and ground-glass opacity of the osseous structures consistent with renal osteodystrophy. Small Schmorl's node superior endplates of L2 and L3. IMPRESSION: 1. Small right and trace left pleural effusions, diminished in size from March CT. There is associated atelectasis in the lung bases. 2. Mild heterogeneous pulmonary parenchyma, can be seen with small vessel or small airways disease. 3. Mild mediastinal adenopathy, improved from prior exam, likely reactive. 4. No acute abnormality in the abdomen/pelvis. 5. Moderate to large volume of stool throughout the colon, can be seen with constipation. Mild colonic diverticulosis without diverticulitis. 6. Sequela of chronic renal disease with marked renal atrophy and renal vascular calcifications. 7. Prominent bilateral inguinal and left external iliac nodes, likely reactive but nonspecific. These are similar to prior exam. Aortic Atherosclerosis (ICD10-I70.0). Electronically Signed   By: Keith Rake M.D.   On: 04/28/2022 19:45   CT HEAD WO CONTRAST (5MM)  Result Date: 04/28/2022 CLINICAL DATA:  Altered mental status EXAM: CT HEAD WITHOUT CONTRAST TECHNIQUE: Contiguous axial images were obtained from the base of the skull through the vertex without intravenous contrast. RADIATION DOSE REDUCTION: This exam was performed according to the departmental dose-optimization program which includes automated exposure control, adjustment of the mA and/or kV according to patient size and/or  use of iterative reconstruction technique. COMPARISON:  04/29/2021 FINDINGS: Brain: There is no mass, hemorrhage or extra-axial collection. The size and configuration of the ventricles and extra-axial CSF spaces are normal. The brain parenchyma is normal, without acute or chronic infarction. Vascular: No abnormal hyperdensity of the major intracranial arteries or dural venous sinuses. No intracranial atherosclerosis. Skull: The visualized skull base, calvarium and extracranial soft tissues are normal. Sinuses/Orbits: No fluid levels or advanced mucosal thickening of the visualized paranasal sinuses. No mastoid or middle ear effusion. The orbits are normal. IMPRESSION: Normal head CT. Electronically Signed   By: Ulyses Jarred M.D.   On:  04/28/2022 19:31   DG Chest Port 1 View  Result Date: 04/28/2022 CLINICAL DATA:  Fever, body aches EXAM: PORTABLE CHEST 1 VIEW COMPARISON:  12/02/2021 FINDINGS: Single frontal view of the chest demonstrates stable enlarged cardiac silhouette. Right internal jugular dialysis catheter unchanged. There is increased central vascular congestion, with diffuse interstitial and ground-glass opacities consistent with edema. Trace bilateral effusions. No pneumothorax. Stable vascular stent in the left subclavian distribution. No acute bony abnormality. IMPRESSION: 1. Findings most consistent with mild congestive heart failure and edema. Superimposed infection would be difficult to exclude. Electronically Signed   By: Randa Ngo M.D.   On: 04/28/2022 18:32        Scheduled Meds:  amLODipine  10 mg Oral Daily   aspirin EC  81 mg Oral Daily   butalbital-acetaminophen-caffeine  2 tablet Oral Once   Chlorhexidine Gluconate Cloth  6 each Topical Q0600   feeding supplement (NEPRO CARB STEADY)  237 mL Oral BID BM   gabapentin  100 mg Oral TID   heparin  5,000 Units Subcutaneous Q12H   insulin aspart  0-6 Units Subcutaneous TID WC   lipase/protease/amylase  36,000 Units Oral TID WC    mirtazapine  7.5 mg Oral QHS   multivitamin  1 tablet Oral Daily   pantoprazole  40 mg Oral Daily   polyethylene glycol  34 g Oral BID   sevelamer carbonate  800 mg Oral TID WC   sodium chloride flush  3 mL Intravenous Q12H   Continuous Infusions:  anticoagulant sodium citrate       LOS: 2 days    Time spent: 35 mins    Wyvonnia Dusky, MD [facility] Pager [phone]  If 7PM-7AM, please contact night-coverage [email or web address] 04/30/2022, 9:00 AM

## 2022-04-30 NOTE — Progress Notes (Signed)
Pt c/o nausea and vomited a scant amount of solid food.

## 2022-04-30 NOTE — Progress Notes (Signed)
   04/30/22 0000  Mobility  HOB Elevated/Bed Position HOB greater than 30  Activity Stood at bedside;Ambulated with assistance in room;Ambulated with assistance to bathroom  Range of Motion/Exercises Active;All extremities  Level of Assistance Contact guard assist, steadying assist  Assistive Device Front wheel walker  Distance Ambulated (ft) 20 ft  Activity Response Tolerated fair  Warehouse manager ambulated patient to the BR. Patient moved  really slowly during trip to bathroom. Total trip time (to BR, Using BR, and from BR) >30 mins. The patient did not seem weak but very stiff and had complaints of pain but relief at the same time, as well as 1 spell of dizziness during the trip to the BR. Writer moved [facility] to bedside. 1x assist to [facility] d/t light headedness and pt safety.

## 2022-04-30 NOTE — Progress Notes (Signed)
       CROSS COVER NOTE  NAME: [name] MRN: [id number] DOB : 11-11-1960    Date of Service   04/30/22  HPI/Events of Note   Nausea  Interventions   Plan: [place]ergan 12.5 X X      This document was prepared using Dragon voice recognition software and may include unintentional dictation errors.  Neomia Glass DNP, MHA, FNP-BC Nurse Practitioner [facility] Pager [phone]

## 2022-04-30 NOTE — Progress Notes (Signed)
[facility]  ROUNDING NOTE   Subjective:   [name] is a 61 y.o., female with past medical history of anemia, asthma, diabetes, CAD, PAD, GERD, and end stage renal disease on dialysis. Patient present to ED with fever that began yesterday. She has been admitted for Fever [R50.9] Fever, unspecified fever cause [R50.9]  Patient is known to our clinic and receives outpatient dialysis at [facility] on a TTS schedule.   Update Patient seen resting in bed Breakfast at bedside Requesting discharge Afebrile overnight Denies pain and discomfort   Objective:  Vital signs in last 24 hours:  Temp:  [97.4 F (36.3 C)-98.6 F (37 C)] 98.4 F (36.9 C) (08/09 1138) Pulse Rate:  [58-74] 68 (08/09 1138) Resp:  [10-20] 18 (08/09 1138) BP: (115-148)/(40-59) 135/50 (08/09 1138) SpO2:  [100 %] 100 % (08/09 1138) Weight:  [77.4 kg-78.9 kg] 78.9 kg (08/09 0500)  Weight change: 2 kg Filed Weights   04/29/22 1338 04/29/22 1725 04/30/22 0500  Weight: 78.8 kg 77.4 kg 78.9 kg    Intake/Output: I/O last 3 completed shifts: In: 197 [IV Piggyback:197] Out: 1500 [Other:1500]   Intake/Output this shift:  No intake/output data recorded.  Physical Exam: General: NAD, resting quietly  Head: Normocephalic, atraumatic. Moist oral mucosal membranes  Eyes: Anicteric  Lungs:  Clear to auscultation, normal effort   Heart: Regular rate and rhythm  Abdomen:  Soft, nontender  Extremities: No peripheral edema.  Neurologic: Nonfocal, moving all four extremities  Skin: No lesions  Access: Right chest PermCath    Basic Metabolic Panel: Recent Labs  Lab 04/28/22 1759 04/29/22 0602 04/30/22 0402  NA 137 133* 134*  K 5.1 5.0 4.7  CL 95* 96* 97*  CO2 '28 28 28  '$ GLUCOSE 90 63* 97  BUN 37* 46* 22  CREATININE 5.79* 6.63* 4.07*  CALCIUM 8.7* 7.9* 8.0*  MG  --   --  1.8     Liver Function Tests: Recent Labs  Lab 04/28/22 1759 04/29/22 0602  AST 24 43*  ALT 17 32  ALKPHOS  91 74  BILITOT 0.6 1.0  PROT 7.6 6.1*  ALBUMIN 3.9 2.8*    No results for input(s): "LIPASE", "AMYLASE" in the last 168 hours. No results for input(s): "AMMONIA" in the last 168 hours.  CBC: Recent Labs  Lab 04/28/22 1759 04/29/22 0602 04/30/22 0402  WBC 9.6 11.6* 7.0  NEUTROABS 7.5  --   --   HGB 8.8* 8.0* 7.9*  HCT 27.5* 24.6* 23.8*  MCV 94.5 96.1 93.7  PLT 176 149* 139*     Cardiac Enzymes: Recent Labs  Lab 04/28/22 1759  CKTOTAL 35*     BNP: Invalid input(s): "POCBNP"  CBG: Recent Labs  Lab 04/29/22 1234 04/29/22 1836 04/29/22 2108 04/30/22 0732 04/30/22 1141  GLUCAP 79 66* 104* 82 124*     Microbiology: Results for orders placed or performed during the hospital encounter of 04/28/22  Culture, blood (Routine x 2)     Status: None (Preliminary result)   Collection Time: 04/28/22  5:59 PM   Specimen: BLOOD  Result Value Ref Range Status   Specimen Description BLOOD BLOOD RIGHT FOREARM  Final   Special Requests   Final    BOTTLES DRAWN AEROBIC AND ANAEROBIC Blood Culture adequate volume   Culture   Final    NO GROWTH 2 DAYS Performed at [facility], [address], [place], [place] [address]    Report Status PENDING  Incomplete  Culture, blood (Routine  x 2)     Status: None (Preliminary result)   Collection Time: 04/28/22  6:04 PM   Specimen: BLOOD  Result Value Ref Range Status   Specimen Description BLOOD BLOOD RIGHT FOREARM  Final   Special Requests   Final    BOTTLES DRAWN AEROBIC AND ANAEROBIC Blood Culture adequate volume   Culture   Final    NO GROWTH 2 DAYS Performed at [facility], [address], [place], [place] [address]    Report Status PENDING  Incomplete  SARS Coronavirus 2 by RT PCR (hospital order, performed in [place] hospital lab) *cepheid single result test* Anterior Nasal Swab     Status: None   Collection Time: 04/28/22  8:09 PM   Specimen: Anterior Nasal Swab  Result Value Ref Range Status    SARS Coronavirus 2 by RT PCR NEGATIVE NEGATIVE Final    Comment: (NOTE) SARS-CoV-2 target nucleic acids are NOT DETECTED.  The SARS-CoV-2 RNA is generally detectable in upper and lower respiratory specimens during the acute phase of infection. The lowest concentration of SARS-CoV-2 viral copies this assay can detect is 250 copies / mL. A negative result does not preclude SARS-CoV-2 infection and should not be used as the sole basis for treatment or other patient management decisions.  A negative result may occur with improper specimen collection / handling, submission of specimen other than nasopharyngeal swab, presence of viral mutation(s) within the areas targeted by this assay, and inadequate number of viral copies (<250 copies / mL). A negative result must be combined with clinical observations, patient history, and epidemiological information.  Fact Sheet for Patients:   https://www.patel.info/  Fact Sheet for Healthcare Providers: https://hall.com/  This test is not yet approved or  cleared by the [place] FDA and has been authorized for detection and/or diagnosis of SARS-CoV-2 by FDA under an Emergency Use Authorization (EUA).  This EUA will remain in effect (meaning this test can be used) for the duration of the COVID-19 declaration under Section 564(b)(1) of the Act, 21 U.S.C. section 360bbb-3(b)(1), unless the authorization is terminated or revoked sooner.  Performed at [facility], [place], [place] [address]   Respiratory (~20 pathogens) panel by PCR     Status: None   Collection Time: 04/29/22  1:15 PM   Specimen: Nasopharyngeal Swab; Respiratory  Result Value Ref Range Status   Adenovirus NOT DETECTED NOT DETECTED Final   Coronavirus 229E NOT DETECTED NOT DETECTED Final    Comment: (NOTE) The Coronavirus on the Respiratory Panel, DOES NOT test for the novel  Coronavirus (2019 nCoV)     Coronavirus HKU1 NOT DETECTED NOT DETECTED Final   Coronavirus NL63 NOT DETECTED NOT DETECTED Final   Coronavirus OC43 NOT DETECTED NOT DETECTED Final   Metapneumovirus NOT DETECTED NOT DETECTED Final   Rhinovirus / Enterovirus NOT DETECTED NOT DETECTED Final   Influenza A NOT DETECTED NOT DETECTED Final   Influenza B NOT DETECTED NOT DETECTED Final   Parainfluenza Virus 1 NOT DETECTED NOT DETECTED Final   Parainfluenza Virus 2 NOT DETECTED NOT DETECTED Final   Parainfluenza Virus 3 NOT DETECTED NOT DETECTED Final   Parainfluenza Virus 4 NOT DETECTED NOT DETECTED Final   Respiratory Syncytial Virus NOT DETECTED NOT DETECTED Final   Bordetella pertussis NOT DETECTED NOT DETECTED Final   Bordetella Parapertussis NOT DETECTED NOT DETECTED Final   Chlamydophila pneumoniae NOT DETECTED NOT DETECTED Final   Mycoplasma pneumoniae NOT DETECTED NOT DETECTED Final  Comment: Performed at [facility], [place] [address] [place], [place] [address]  Culture, blood (Routine X 2) w Reflex to ID Panel     Status: None (Preliminary result)   Collection Time: 04/30/22  1:20 AM   Specimen: BLOOD  Result Value Ref Range Status   Specimen Description BLOOD PORTA CATH  Final   Special Requests   Final    BOTTLES DRAWN AEROBIC AND ANAEROBIC Blood Culture adequate volume   Culture   Final    NO GROWTH < 12 HOURS Performed at [facility], [address] [place], [place] [address]    Report Status PENDING  Incomplete    Coagulation Studies: Recent Labs    04/28/22 1759  LABPROT 13.2  INR 1.0     Urinalysis: No results for input(s): "COLORURINE", "LABSPEC", "PHURINE", "GLUCOSEU", "HGBUR", "BILIRUBINUR", "KETONESUR", "PROTEINUR", "UROBILINOGEN", "NITRITE", "LEUKOCYTESUR" in the last 72 hours.  Invalid input(s): "APPERANCEUR"    Imaging: CT CHEST ABDOMEN PELVIS WO CONTRAST  Result Date: 04/28/2022 CLINICAL DATA:  Pneumonia, complication suspected, xray done EXAM: CT CHEST, ABDOMEN  AND PELVIS WITHOUT CONTRAST TECHNIQUE: Multidetector CT imaging of the chest, abdomen and pelvis was performed following the standard protocol without IV contrast. RADIATION DOSE REDUCTION: This exam was performed according to the departmental dose-optimization program which includes automated exposure control, adjustment of the mA and/or kV according to patient size and/or use of iterative reconstruction technique. COMPARISON:  Radiograph earlier today. Chest CT 12/03/2021. Abdominopelvic CT 11/15/2021 FINDINGS: CT CHEST FINDINGS Cardiovascular: Right-sided dialysis catheter tip in the right atrium. Mild cardiomegaly with coronary artery calcifications. Aortic atherosclerosis. Mild dilatation of the main pulmonary artery at 3.2 cm. No pericardial effusion. Left subclavian stent in place. Multiple vascular stents in the forearm. There are mild chest wall collaterals. Mediastinum/Nodes: Multifocal mediastinal adenopathy with improvement from prior exam. For example AP window node measures 8 mm short axis previously 11 mm. Anterior lower paratracheal node measures 11 mm, previously 14 mm. Patulous esophagus. No visible thyroid nodule. Lungs/Pleura: Small right and trace left pleural effusion, diminished in size from prior CT. There is associated atelectasis in the lung bases. Mild heterogeneous pulmonary parenchyma. Occasional areas of subpleural reticulation in both lungs. No confluent consolidation. Unremarkable appearance of the trachea and central bronchi. Musculoskeletal: No acute osseous findings. Mild diffuse sclerosis and ground-glass opacities the osseous structures consistent with renal osteodystrophy. Chronic areas of endplate sclerosis and irregularity at the T7-T8, unchanged. CT ABDOMEN PELVIS FINDINGS Hepatobiliary: No focal hepatic abnormality on this unenhanced exam. Clips in the gallbladder fossa postcholecystectomy. No biliary dilatation. Pancreas: Mild fatty atrophy.  No ductal dilatation or  inflammation. Spleen: Normal in size without focal abnormality. Adrenals/Urinary Tract: No adrenal nodule. Sequela of chronic renal disease with marked renal atrophy and renal vascular calcifications. Small simple cyst in the mid left kidney, needs no further follow-up. Suspicious renal lesion. Decompressed urinary bladder. Stomach/Bowel: Partially distended stomach. No small bowel obstruction or inflammation. Appendix is not definitively visualized. Moderate to large volume of stool throughout the colon. Few diverticula in the right and sigmoid colon without diverticulitis. No colonic inflammation. Vascular/Lymphatic: Advanced aortic and branch atherosclerosis. No aneurysm. No portal venous gas. No enlarged lymph nodes in the abdomen. There are prominent bilateral inguinal and left external iliac nodes, largest measuring 14 mm in the low left inguinal region. Reproductive: Status post hysterectomy. No adnexal masses. There is air in the vagina, nonspecific. Other: No ascites. No free air. There is generalized body wall edema, although improved from prior  exam. Musculoskeletal: No acute osseous findings. Mild generalized sclerosis and ground-glass opacity of the osseous structures consistent with renal osteodystrophy. Small Schmorl's node superior endplates of L2 and L3. IMPRESSION: 1. Small right and trace left pleural effusions, diminished in size from March CT. There is associated atelectasis in the lung bases. 2. Mild heterogeneous pulmonary parenchyma, can be seen with small vessel or small airways disease. 3. Mild mediastinal adenopathy, improved from prior exam, likely reactive. 4. No acute abnormality in the abdomen/pelvis. 5. Moderate to large volume of stool throughout the colon, can be seen with constipation. Mild colonic diverticulosis without diverticulitis. 6. Sequela of chronic renal disease with marked renal atrophy and renal vascular calcifications. 7. Prominent bilateral inguinal and left external  iliac nodes, likely reactive but nonspecific. These are similar to prior exam. Aortic Atherosclerosis (ICD10-I70.0). Electronically Signed   By: Keith Rake M.D.   On: 04/28/2022 19:45   CT HEAD WO CONTRAST (5MM)  Result Date: 04/28/2022 CLINICAL DATA:  Altered mental status EXAM: CT HEAD WITHOUT CONTRAST TECHNIQUE: Contiguous axial images were obtained from the base of the skull through the vertex without intravenous contrast. RADIATION DOSE REDUCTION: This exam was performed according to the departmental dose-optimization program which includes automated exposure control, adjustment of the mA and/or kV according to patient size and/or use of iterative reconstruction technique. COMPARISON:  04/29/2021 FINDINGS: Brain: There is no mass, hemorrhage or extra-axial collection. The size and configuration of the ventricles and extra-axial CSF spaces are normal. The brain parenchyma is normal, without acute or chronic infarction. Vascular: No abnormal hyperdensity of the major intracranial arteries or dural venous sinuses. No intracranial atherosclerosis. Skull: The visualized skull base, calvarium and extracranial soft tissues are normal. Sinuses/Orbits: No fluid levels or advanced mucosal thickening of the visualized paranasal sinuses. No mastoid or middle ear effusion. The orbits are normal. IMPRESSION: Normal head CT. Electronically Signed   By: Ulyses Jarred M.D.   On: 04/28/2022 19:31   DG Chest Port 1 View  Result Date: 04/28/2022 CLINICAL DATA:  Fever, body aches EXAM: PORTABLE CHEST 1 VIEW COMPARISON:  12/02/2021 FINDINGS: Single frontal view of the chest demonstrates stable enlarged cardiac silhouette. Right internal jugular dialysis catheter unchanged. There is increased central vascular congestion, with diffuse interstitial and ground-glass opacities consistent with edema. Trace bilateral effusions. No pneumothorax. Stable vascular stent in the left subclavian distribution. No acute bony  abnormality. IMPRESSION: 1. Findings most consistent with mild congestive heart failure and edema. Superimposed infection would be difficult to exclude. Electronically Signed   By: Randa Ngo M.D.   On: 04/28/2022 18:32     Medications:    anticoagulant sodium citrate      amLODipine  10 mg Oral Daily   aspirin EC  81 mg Oral Daily   butalbital-acetaminophen-caffeine  2 tablet Oral Once   Chlorhexidine Gluconate Cloth  6 each Topical Q0600   feeding supplement (NEPRO CARB STEADY)  237 mL Oral BID BM   gabapentin  100 mg Oral TID   heparin  5,000 Units Subcutaneous Q12H   insulin aspart  0-6 Units Subcutaneous TID WC   lipase/protease/amylase  36,000 Units Oral TID WC   mirtazapine  7.5 mg Oral QHS   multivitamin  1 tablet Oral Daily   pantoprazole  40 mg Oral Daily   polyethylene glycol  34 g Oral BID   saccharomyces boulardii  250 mg Oral BID   sevelamer carbonate  800 mg Oral TID WC   sodium chloride flush  3 mL  Intravenous Q12H   acetaminophen **OR** acetaminophen, albuterol, alteplase, anticoagulant sodium citrate, heparin, hydrALAZINE, HYDROcodone-acetaminophen, lidocaine (PF), lidocaine-prilocaine, nitroGLYCERIN, pentafluoroprop-tetrafluoroeth, sevelamer carbonate **AND** sevelamer carbonate  Assessment/ Plan:  [name] is a 61 y.o.  female with past medical history of anemia, asthma, diabetes, CAD, PAD, GERD, and end stage renal disease on dialysis. Patient present to ED with fever that began yesterday. She has been admitted for Fever [R50.9] Fever, unspecified fever cause [R50.9]  [facility] [place]/TTS/Rt permcath  End stage renal disease on hemodialysis.   Dialysis received yesterday, UF 1.5L achieved. Next treatment scheduled for Thursday.   2. Anemia of chronic kidney disease Lab Results  Component Value Date   HGB 7.9 (L) 04/30/2022    Mircera in outpatient clinic.   3. Secondary Hyperparathyroidism: with outpatient labs: PTH 736, phosphorus 5.3,  calcium 8.6 on 04/01/22.   Lab Results  Component Value Date   PTH 357 (H) 10/05/2018   CALCIUM 8.0 (L) 04/30/2022   CAION 1.09 (L) 04/13/2020   PHOS 4.9 (H) 12/03/2021    Will continue to monitor bone minerals during this admission. Continue sevelamer with  meals.   4. Diabetes mellitus type II with chronic kidney disease: noninsulin dependent. Most recent hemoglobin A1c is 6.2 on 11/15/21.      LOS: 2   8/9/202312:52 PM

## 2022-05-01 DIAGNOSIS — N186 End stage renal disease: Secondary | ICD-10-CM | POA: Diagnosis not present

## 2022-05-01 DIAGNOSIS — Z992 Dependence on renal dialysis: Secondary | ICD-10-CM | POA: Diagnosis not present

## 2022-05-01 DIAGNOSIS — S91109A Unspecified open wound of unspecified toe(s) without damage to nail, initial encounter: Secondary | ICD-10-CM | POA: Diagnosis not present

## 2022-05-01 DIAGNOSIS — R509 Fever, unspecified: Secondary | ICD-10-CM | POA: Diagnosis not present

## 2022-05-01 LAB — CBC
HCT: 23.1 % — ABNORMAL LOW (ref 36.0–46.0)
Hemoglobin: 7.7 g/dL — ABNORMAL LOW (ref 12.0–15.0)
MCH: 30.8 pg (ref 26.0–34.0)
MCHC: 33.3 g/dL (ref 30.0–36.0)
MCV: 92.4 fL (ref 80.0–100.0)
Platelets: 131 10*3/uL — ABNORMAL LOW (ref 150–400)
RBC: 2.5 MIL/uL — ABNORMAL LOW (ref 3.87–5.11)
RDW: 14.3 % (ref 11.5–15.5)
WBC: 5.7 10*3/uL (ref 4.0–10.5)
nRBC: 0 % (ref 0.0–0.2)

## 2022-05-01 LAB — GLUCOSE, CAPILLARY
Glucose-Capillary: 62 mg/dL — ABNORMAL LOW (ref 70–99)
Glucose-Capillary: 93 mg/dL (ref 70–99)
Glucose-Capillary: 92 mg/dL (ref 70–99)
Glucose-Capillary: 132 mg/dL — ABNORMAL HIGH (ref 70–99)

## 2022-05-01 LAB — BASIC METABOLIC PANEL
Anion gap: 12 (ref 5–15)
BUN: 33 mg/dL — ABNORMAL HIGH (ref 8–23)
CO2: 27 mmol/L (ref 22–32)
Calcium: 8.1 mg/dL — ABNORMAL LOW (ref 8.9–10.3)
Chloride: 95 mmol/L — ABNORMAL LOW (ref 98–111)
Creatinine, Ser: 5.18 mg/dL — ABNORMAL HIGH (ref 0.44–1.00)
GFR, Estimated: 9 mL/min — ABNORMAL LOW (ref >60)
Glucose, Bld: 86 mg/dL (ref 70–99)
Potassium: 5.3 mmol/L — ABNORMAL HIGH (ref 3.5–5.1)
Sodium: 134 mmol/L — ABNORMAL LOW (ref 135–145)

## 2022-05-01 LAB — MAGNESIUM: Magnesium: 2 mg/dL (ref 1.7–2.4)

## 2022-05-01 MED ORDER — DOXYCYCLINE HYCLATE 100 MG PO TABS
100.0000 mg | ORAL_TABLET | Freq: Two times a day (BID) | ORAL | Status: DC
  Administered 2022-05-01 – 2022-05-02 (×3): 100 mg via ORAL
  Filled 2022-05-01 (×3): qty 1

## 2022-05-01 NOTE — Progress Notes (Signed)
[facility]  ROUNDING NOTE   Subjective:   [name] is a 61 y.o., female with past medical history of anemia, asthma, diabetes, CAD, PAD, GERD, and end stage renal disease on dialysis. Patient present to ED with fever that began yesterday. She has been admitted for Fever [R50.9] Fever, unspecified fever cause [R50.9]  Patient is known to our clinic and receives outpatient dialysis at [facility] on a TTS schedule.   Update Patient seen and evaluated during dialysis   HEMODIALYSIS FLOWSHEET:  Blood Flow Rate (mL/min): 400 mL/min Arterial Pressure (mmHg): -140 mmHg Venous Pressure (mmHg): 160 mmHg TMP (mmHg): 6 mmHg Ultrafiltration Rate (mL/min): 588 mL/min Dialysate Flow Rate (mL/min): 300 ml/min Dialysis Fluid Bolus: Normal Saline Bolus Amount (mL): 300 mL  No complaints at this time   Objective:  Vital signs in last 24 hours:  Temp:  [97.6 F (36.4 C)-98.8 F (37.1 C)] 98.8 F (37.1 C) (08/10 1205) Pulse Rate:  [63-75] 73 (08/10 1205) Resp:  [10-18] 16 (08/10 1205) BP: (120-165)/(48-97) 149/48 (08/10 1205) SpO2:  [99 %-100 %] 99 % (08/10 1205) Weight:  [76.7 kg-78.9 kg] 76.7 kg (08/10 1149)  Weight change:  Filed Weights   04/30/22 0500 05/01/22 0805 05/01/22 1149  Weight: 78.9 kg 78.9 kg 76.7 kg    Intake/Output: No intake/output data recorded.   Intake/Output this shift:  Total I/O In: -  Out: 1500 [Other:1500]  Physical Exam: General: NAD, resting quietly  Head: Normocephalic, atraumatic. Moist oral mucosal membranes  Eyes: Anicteric  Lungs:  Clear to auscultation, normal effort   Heart: Regular rate and rhythm  Abdomen:  Soft, nontender  Extremities: No peripheral edema.  Neurologic: Nonfocal, moving all four extremities  Skin: No lesions  Access: Right chest PermCath    Basic Metabolic Panel: Recent Labs  Lab 04/28/22 1759 04/29/22 0602 04/30/22 0402 05/01/22 0352  NA 137 133* 134* 134*  K 5.1 5.0 4.7 5.3*  CL 95*  96* 97* 95*  CO2 '28 28 28 27  '$ GLUCOSE 90 63* 97 86  BUN 37* 46* 22 33*  CREATININE 5.79* 6.63* 4.07* 5.18*  CALCIUM 8.7* 7.9* 8.0* 8.1*  MG  --   --  1.8 2.0     Liver Function Tests: Recent Labs  Lab 04/28/22 1759 04/29/22 0602  AST 24 43*  ALT 17 32  ALKPHOS 91 74  BILITOT 0.6 1.0  PROT 7.6 6.1*  ALBUMIN 3.9 2.8*    No results for input(s): "LIPASE", "AMYLASE" in the last 168 hours. No results for input(s): "AMMONIA" in the last 168 hours.  CBC: Recent Labs  Lab 04/28/22 1759 04/29/22 0602 04/30/22 0402 05/01/22 0352  WBC 9.6 11.6* 7.0 5.7  NEUTROABS 7.5  --   --   --   HGB 8.8* 8.0* 7.9* 7.7*  HCT 27.5* 24.6* 23.8* 23.1*  MCV 94.5 96.1 93.7 92.4  PLT 176 149* 139* 131*     Cardiac Enzymes: Recent Labs  Lab 04/28/22 1759  CKTOTAL 35*     BNP: Invalid input(s): "POCBNP"  CBG: Recent Labs  Lab 04/30/22 0732 04/30/22 1141 04/30/22 1547 04/30/22 2134 05/01/22 1207  GLUCAP 97 124* 146* 153* 66*     Microbiology: Results for orders placed or performed during the hospital encounter of 04/28/22  Culture, blood (Routine x 2)     Status: None (Preliminary result)   Collection Time: 04/28/22  5:59 PM   Specimen: BLOOD  Result Value Ref Range Status   Specimen Description BLOOD BLOOD RIGHT  FOREARM  Final   Special Requests   Final    BOTTLES DRAWN AEROBIC AND ANAEROBIC Blood Culture adequate volume   Culture   Final    NO GROWTH 3 DAYS Performed at [facility], [address] [place], [place] [address]    Report Status PENDING  Incomplete  Culture, blood (Routine x 2)     Status: None (Preliminary result)   Collection Time: 04/28/22  6:04 PM   Specimen: BLOOD  Result Value Ref Range Status   Specimen Description BLOOD BLOOD RIGHT FOREARM  Final   Special Requests   Final    BOTTLES DRAWN AEROBIC AND ANAEROBIC Blood Culture adequate volume   Culture   Final    NO GROWTH 3 DAYS Performed at [facility], [address] [place], [place] [address]    Report Status PENDING  Incomplete  SARS Coronavirus 2 by RT PCR (hospital order, performed in [place] hospital lab) *cepheid single result test* Anterior Nasal Swab     Status: None   Collection Time: 04/28/22  8:09 PM   Specimen: Anterior Nasal Swab  Result Value Ref Range Status   SARS Coronavirus 2 by RT PCR NEGATIVE NEGATIVE Final    Comment: (NOTE) SARS-CoV-2 target nucleic acids are NOT DETECTED.  The SARS-CoV-2 RNA is generally detectable in upper and lower respiratory specimens during the acute phase of infection. The lowest concentration of SARS-CoV-2 viral copies this assay can detect is 250 copies / mL. A negative result does not preclude SARS-CoV-2 infection and should not be used as the sole basis for treatment or other patient management decisions.  A negative result may occur with improper specimen collection / handling, submission of specimen other than nasopharyngeal swab, presence of viral mutation(s) within the areas targeted by this assay, and inadequate number of viral copies (<250 copies / mL). A negative result must be combined with clinical observations, patient history, and epidemiological information.  Fact Sheet for Patients:   https://www.patel.info/  Fact Sheet for Healthcare Providers: https://hall.com/  This test is not yet approved or  cleared by the [place] FDA and has been authorized for detection and/or diagnosis of SARS-CoV-2 by FDA under an Emergency Use Authorization (EUA).  This EUA will remain in effect (meaning this test can be used) for the duration of the COVID-19 declaration under Section 564(b)(1) of the Act, 21 U.S.C. section 360bbb-3(b)(1), unless the authorization is terminated or revoked sooner.  Performed at [facility], [place], [place] [address]   Respiratory (~20 pathogens) panel by PCR     Status: None    Collection Time: 04/29/22  1:15 PM   Specimen: Nasopharyngeal Swab; Respiratory  Result Value Ref Range Status   Adenovirus NOT DETECTED NOT DETECTED Final   Coronavirus 229E NOT DETECTED NOT DETECTED Final    Comment: (NOTE) The Coronavirus on the Respiratory Panel, DOES NOT test for the novel  Coronavirus (2019 nCoV)    Coronavirus HKU1 NOT DETECTED NOT DETECTED Final   Coronavirus NL63 NOT DETECTED NOT DETECTED Final   Coronavirus OC43 NOT DETECTED NOT DETECTED Final   Metapneumovirus NOT DETECTED NOT DETECTED Final   Rhinovirus / Enterovirus NOT DETECTED NOT DETECTED Final   Influenza A NOT DETECTED NOT DETECTED Final   Influenza B NOT DETECTED NOT DETECTED Final   Parainfluenza Virus 1 NOT DETECTED NOT DETECTED Final   Parainfluenza Virus 2 NOT DETECTED NOT DETECTED Final   Parainfluenza Virus 3 NOT DETECTED NOT DETECTED Final  Parainfluenza Virus 4 NOT DETECTED NOT DETECTED Final   Respiratory Syncytial Virus NOT DETECTED NOT DETECTED Final   Bordetella pertussis NOT DETECTED NOT DETECTED Final   Bordetella Parapertussis NOT DETECTED NOT DETECTED Final   Chlamydophila pneumoniae NOT DETECTED NOT DETECTED Final   Mycoplasma pneumoniae NOT DETECTED NOT DETECTED Final    Comment: Performed at [facility], [place] [address], [place], [place] [address]  Culture, blood (Routine X 2) w Reflex to ID Panel     Status: None (Preliminary result)   Collection Time: 04/30/22  1:20 AM   Specimen: BLOOD  Result Value Ref Range Status   Specimen Description BLOOD PORTA CATH  Final   Special Requests   Final    BOTTLES DRAWN AEROBIC AND ANAEROBIC Blood Culture adequate volume   Culture   Final    NO GROWTH 1 DAY Performed at [facility], [address] [place], [place] [address]    Report Status PENDING  Incomplete  Aerobic/Anaerobic Culture w Gram Stain (surgical/deep wound)     Status: None (Preliminary result)   Collection Time: 04/30/22 12:42 PM   Specimen: Wound   Result Value Ref Range Status   Specimen Description WOUND  Final   Special Requests LEFT FOOT  Final   Gram Stain   Final    NO WBC SEEN RARE GRAM POSITIVE COCCI RARE GRAM POSITIVE RODS    Culture   Final    CULTURE REINCUBATED FOR BETTER GROWTH Performed at [facility], [place] [address] [place], [place] [address]    Report Status PENDING  Incomplete    Coagulation Studies: Recent Labs    04/28/22 1759  LABPROT 13.2  INR 1.0     Urinalysis: No results for input(s): "COLORURINE", "LABSPEC", "PHURINE", "GLUCOSEU", "HGBUR", "BILIRUBINUR", "KETONESUR", "PROTEINUR", "UROBILINOGEN", "NITRITE", "LEUKOCYTESUR" in the last 72 hours.  Invalid input(s): "APPERANCEUR"    Imaging: No results found.   Medications:      amLODipine  10 mg Oral Daily   aspirin EC  81 mg Oral Daily   butalbital-acetaminophen-caffeine  2 tablet Oral Once   Chlorhexidine Gluconate Cloth  6 each Topical Q0600   doxycycline  100 mg Oral Q12H   feeding supplement (NEPRO CARB STEADY)  237 mL Oral BID BM   gabapentin  100 mg Oral TID   heparin  5,000 Units Subcutaneous Q12H   insulin aspart  0-6 Units Subcutaneous TID WC   lipase/protease/amylase  36,000 Units Oral TID WC   mirtazapine  7.5 mg Oral QHS   multivitamin  1 tablet Oral Daily   pantoprazole  40 mg Oral Daily   polyethylene glycol  34 g Oral BID   saccharomyces boulardii  250 mg Oral BID   sevelamer carbonate  800 mg Oral TID WC   sodium chloride flush  3 mL Intravenous Q12H   acetaminophen **OR** acetaminophen, albuterol, hydrALAZINE, HYDROcodone-acetaminophen, nitroGLYCERIN, sevelamer carbonate **AND** sevelamer carbonate  Assessment/ Plan:  [name] is a 61 y.o.  female with past medical history of anemia, asthma, diabetes, CAD, PAD, GERD, and end stage renal disease on dialysis. Patient present to ED with fever that began yesterday. She has been admitted for Fever [R50.9] Fever, unspecified fever cause [R50.9]  [facility]/Rt permcath  End stage renal disease on hemodialysis.   Received dialysis today, UF goal 1.5L as tolerated. Next treatment scheduled for Saturday.  2. Anemia of chronic kidney disease Lab Results  Component Value Date   HGB  7.7 (L) 05/01/2022    Mircera in outpatient clinic. Hgb below target. Will order EPO with dialysis treatments.   3. Secondary Hyperparathyroidism: with outpatient labs: PTH 736, phosphorus 5.3, calcium 8.6 on 04/01/22.   Lab Results  Component Value Date   PTH 357 (H) 10/05/2018   CALCIUM 8.1 (L) 05/01/2022   CAION 1.09 (L) 04/13/2020   PHOS 4.9 (H) 12/03/2021   Continue sevelamer with  meals.   4. Diabetes mellitus type II with chronic kidney disease: noninsulin dependent. Most recent hemoglobin A1c is 6.2 on 11/15/21.   5. Fever, unknown etiology. Awaiting blood cultures. Afebrile since admission.    LOS: 3   8/10/202312:54 PM

## 2022-05-01 NOTE — Evaluation (Signed)
Physical Therapy Evaluation Patient Details Name: [name] MRN: [id number] DOB: Aug 22, 1961 Today's Date: 05/01/2022  History of Present Illness  Pt is a 61 y.o., female with past medical history of anemia, asthma, diabetes, CAD, PAD, GERD, CHF, and end stage renal disease on dialysis. Patient present to ED with fever that began yesterday. Noted for L great toe wound as well.   Clinical Impression  Patient alert, speaking with OT. Seen as a co-evaluation PT/OT to maximize pt function, participation and safety. Pt reported at home she is able to do her ADLs, ambulated with RW, and sleeps in a lift recliner.      The patient was able to transfer to sitting EOB with minA and bed rails, extra time. Sit <> stand with RW and minA several times during session. From [name] able to stand with CGA. Pt able to perform pericare in sitting, and doff/don briefs in standing (able to statically stand with CGAx2). She was able to take a few steps to and from [facility] to recliner, CGA. Pt complained of dizziness after ambulation, RN notified.  Overall the patient demonstrated deficits (see "PT Problem List") that impede the patient's functional abilities, safety, and mobility and would benefit from skilled PT intervention. Recommendation is SNF due to current level of assistance needed pending further progress.      Recommendations for follow up therapy are one component of a multi-disciplinary discharge planning process, led by the attending physician.  Recommendations may be updated based on patient status, additional functional criteria and insurance authorization.  Follow Up Recommendations Skilled nursing-short term rehab (<3 hours/day) Can patient physically be transported by private vehicle: No    Assistance Recommended at Discharge Frequent or constant Supervision/Assistance  Patient can return home with the following  A little help with walking and/or transfers;A little help with  bathing/dressing/bathroom;Assistance with cooking/housework;Direct supervision/assist for financial management;Assist for transportation;Help with stairs or ramp for entrance;Direct supervision/assist for medications management    Equipment Recommendations None recommended by PT  Recommendations for Other Services       Functional Status Assessment Patient has had a recent decline in their functional status and demonstrates the ability to make significant improvements in function in a reasonable and predictable amount of time.     Precautions / Restrictions Precautions Precautions: Fall Restrictions Weight Bearing Restrictions: No      Mobility  Bed Mobility Overal bed mobility: Needs Assistance Bed Mobility: Supine to Sit     Supine to sit: Min assist, HOB elevated     General bed mobility comments: use of bed rails, extra time    Transfers                        Ambulation/Gait                  Stairs            Wheelchair Mobility    Modified Rankin (Stroke Patients Only)       Balance Overall balance assessment: Needs assistance Sitting-balance support: Feet supported Sitting balance-Leahy Scale: Good       Standing balance-Leahy Scale: Fair Standing balance comment: able to stand and assist with doffing/donning briefs, static standing without RW support, CGA x2 for safety                             Pertinent Vitals/Pain Pain Assessment Pain Assessment: Faces Faces Pain Scale: Hurts even  more Pain Location: BLE, L>R Pain Descriptors / Indicators: Aching, Moaning, Guarding, Grimacing Pain Intervention(s): Limited activity within patient's tolerance, Monitored during session, Repositioned, Patient requesting pain meds-RN notified    Home Living Family/patient expects to be discharged to:: Private residence Living Arrangements: Alone Available Help at Discharge: Family;Available PRN/intermittently;Friend(s) Type of  Home: Apartment Home Access: Level entry         Home Equipment: Rolling Walker (2 wheels);Cane - single point;Hospital bed;IT sales professional (4 wheels);BSC/3in1;Cane - quad Additional Comments: BSC over toilet    Prior Function Prior Level of Function : Needs assist             Mobility Comments: 2WW ADLs Comments: friend helpsprepare meals, son/friend/sister call pt daily, indep with basic ADL, ACTA takes her to HD     Hand Dominance        Extremity/Trunk Assessment   Upper Extremity Assessment Upper Extremity Assessment: Generalized weakness    Lower Extremity Assessment Lower Extremity Assessment: Generalized weakness    Cervical / Trunk Assessment Cervical / Trunk Assessment: Normal  Communication      Cognition Arousal/Alertness: Awake/alert Behavior During Therapy: WFL for tasks assessed/performed Overall Cognitive Status: No family/caregiver present to determine baseline cognitive functioning                                 General Comments: pt oriented to self, situation, displayed emotional lability throughout session, some repetition of one step commands needed        General Comments      Exercises     Assessment/Plan    PT Assessment Patient needs continued PT services  PT Problem List Decreased strength;Decreased mobility;Decreased range of motion;Decreased activity tolerance;Decreased balance;Decreased knowledge of use of DME       PT Treatment Interventions DME instruction;Therapeutic exercise;Gait training;Balance training;Stair training;Neuromuscular re-education;Functional mobility training;Therapeutic activities;Patient/family education    PT Goals (Current goals can be found in the Care Plan section)  Acute Rehab PT Goals Patient Stated Goal: to go home and do what she can for herself PT Goal Formulation: With patient Time For Goal Achievement: 05/15/22 Potential to Achieve Goals: Good    Frequency  Min 2X/week     Co-evaluation PT/OT/SLP Co-Evaluation/Treatment: Yes Reason for Co-Treatment: Necessary to address cognition/behavior during functional activity;For patient/therapist safety;To address functional/ADL transfers PT goals addressed during session: Mobility/safety with mobility;Balance;Proper use of DME OT goals addressed during session: ADL's and self-care;Proper use of Adaptive equipment and DME       AM-PAC PT "6 Clicks" Mobility  Outcome Measure Help needed turning from your back to your side while in a flat bed without using bedrails?: A Little Help needed moving from lying on your back to sitting on the side of a flat bed without using bedrails?: A Little Help needed moving to and from a bed to a chair (including a wheelchair)?: A Little Help needed standing up from a chair using your arms (e.g., wheelchair or bedside chair)?: A Little Help needed to walk in hospital room?: A Little Help needed climbing 3-5 steps with a railing? : A Lot 6 Click Score: 17    End of Session Equipment Utilized During Treatment: Gait belt Activity Tolerance: Patient limited by fatigue (pt limited by BLE pain as well) Patient left: in bed;with call bell/phone within reach;with bed alarm set Nurse Communication: Mobility status PT Visit Diagnosis: Other abnormalities of gait and mobility (R26.89);Difficulty in walking, not elsewhere classified (R26.2);Muscle  weakness (generalized) (M62.81)    Time: 1027-2536 PT Time Calculation (min) (ACUTE ONLY): 33 min   Charges:   PT Evaluation $PT Eval Moderate Complexity: 1 Mod PT Treatments $Therapeutic Activity: 8-22 mins        Lieutenant Diego PT, DPT 4:08 PM,05/01/22

## 2022-05-01 NOTE — Care Management Important Message (Signed)
Important Message  Patient Details  Name: [name] MRN: [id number] Date of Birth: 02-01-1961   Medicare Important Message Given:  N/A - LOS <3 / Initial given by admissions     [name] A Nishanth Mccaughan 05/01/2022, 8:47 AM

## 2022-05-01 NOTE — Progress Notes (Signed)
Received patient in bed to unit.  Alert and oriented.  Informed consent signed and in  chart.   Patient tolerated well.  Transported back to the room  alert, without acute distress.  Hand-off given to patient's nurse.   Access used: CVC Access issues: None  Total UF removed: 1.5 Medication(s) given: None Post HD VS: 155/72   Hr 73   Rr 10   Temp 98.3   [facility]

## 2022-05-01 NOTE — Evaluation (Signed)
Occupational Therapy Evaluation Patient Details Name: [name] MRN: [id number] DOB: 06/03/1961 Today's Date: 05/01/2022   History of Present Illness Pt is a 61 y.o., female with past medical history of anemia, asthma, diabetes, CAD, PAD, GERD, CHF, and end stage renal disease on dialysis. Patient present to ED with fever that began yesterday. Noted for L great toe wound as well.   Clinical Impression   Pt was seen for OT evaluation this date and co-tx with PT. Prior to hospital admission, pt was able to complete ADL herself, ambulating with RW, and sleeps in a lift recliner. Pt presents to acute OT demonstrating impaired ADL performance and functional mobility 2/2 BLE pain, decreased strength, activity tolerance, and balance (See OT problem list). Pt currently requires MIN A and use of bed rails for bed mobility, CGA +2 for STS from EOB with RW on initial attempt, CGA and +2 for safety on 2nd attempt from [facility] after toileting. She required MOD A for LB dressing and clothing mgt, MIN A for thoroughness with pericare after small BM. Pt emotionally labile during session, active listening and emotional support provided. Pt educated in distraction strategies for pain mgt. Pt would benefit from skilled OT services to address noted impairments and functional limitations (see below for any additional details) in order to maximize safety and independence while minimizing falls risk and caregiver burden. Upon hospital discharge, recommend STR to maximize pt safety and return to PLOF.     Recommendations for follow up therapy are one component of a multi-disciplinary discharge planning process, led by the attending physician.  Recommendations may be updated based on patient status, additional functional criteria and insurance authorization.   Follow Up Recommendations  Skilled nursing-short term rehab (<3 hours/day)    Assistance Recommended at Discharge Frequent or constant Supervision/Assistance  Patient  can return home with the following A little help with walking and/or transfers;A little help with bathing/dressing/bathroom;Assistance with cooking/housework;Assist for transportation;Help with stairs or ramp for entrance;Direct supervision/assist for medications management    Functional Status Assessment  Patient has had a recent decline in their functional status and demonstrates the ability to make significant improvements in function in a reasonable and predictable amount of time.  Equipment Recommendations  None recommended by OT    Recommendations for Other Services       Precautions / Restrictions Precautions Precautions: Fall Restrictions Weight Bearing Restrictions: No      Mobility Bed Mobility Overal bed mobility: Needs Assistance Bed Mobility: Supine to Sit     Supine to sit: Min assist, HOB elevated     General bed mobility comments: use of bed rails, extra time    Transfers Overall transfer level: Needs assistance Equipment used: Rolling walker (2 wheels) Transfers: Sit to/from Stand, Bed to chair/wheelchair/BSC Sit to Stand: Min assist, +2 safety/equipment     Step pivot transfers: Min guard, +2 safety/equipment, Min assist            Balance Overall balance assessment: Needs assistance Sitting-balance support: Feet supported Sitting balance-Leahy Scale: Good     Standing balance support: Single extremity supported, No upper extremity supported, During functional activity Standing balance-Leahy Scale: Fair Standing balance comment: able to stand and assist with doffing/donning briefs, static standing without RW support, CGA x2 for safety                           ADL either performed or assessed with clinical judgement   ADL Overall ADL's :  Needs assistance/impaired                                       General ADL Comments: Pt required MOD A for LB dressing and brief change, MIN A for pericare throughness after  setup for initial attempts, and CGA-MIN A +2 for safety with RW for ADL transfers/mobility, limited by pain     Vision         Perception     Praxis      Pertinent Vitals/Pain Pain Assessment Pain Assessment: Faces Faces Pain Scale: Hurts even more Pain Location: BLE, L>R Pain Descriptors / Indicators: Aching, Moaning, Guarding, Grimacing Pain Intervention(s): Limited activity within patient's tolerance, Monitored during session, Repositioned, Patient requesting pain meds-RN notified     Hand Dominance     Extremity/Trunk Assessment Upper Extremity Assessment Upper Extremity Assessment: Generalized weakness   Lower Extremity Assessment Lower Extremity Assessment: Generalized weakness   Cervical / Trunk Assessment Cervical / Trunk Assessment: Normal   Communication     Cognition Arousal/Alertness: Awake/alert Behavior During Therapy: WFL for tasks assessed/performed Overall Cognitive Status: No family/caregiver present to determine baseline cognitive functioning                                 General Comments: pt oriented to self, situation, displayed emotional lability throughout session, some repetition of one step commands needed     General Comments       Exercises Other Exercises Other Exercises: Pt educated briefly in distraction techniques for pain mgt   Shoulder Instructions      Home Living Family/patient expects to be discharged to:: Private residence Living Arrangements: Alone Available Help at Discharge: Family;Available PRN/intermittently;Friend(s) Type of Home: Apartment Home Access: Level entry                     Home Equipment: Rolling Walker (2 wheels);Cane - single point;Hospital bed;Wheelchair Probation officer (4 wheels);BSC/3in1;Cane - quad   Additional Comments: BSC over toilet      Prior Functioning/Environment Prior Level of Function : Needs assist             Mobility Comments: 2WW ADLs Comments:  friend helps prepare meals, son/friend/sister call pt daily, indep with basic ADL, ACTA takes her to HD        OT Problem List: Decreased strength;Pain;Decreased activity tolerance;Impaired balance (sitting and/or standing);Decreased knowledge of use of DME or AE      OT Treatment/Interventions: Self-care/ADL training;Therapeutic exercise;Therapeutic activities;Energy conservation;DME and/or AE instruction;Patient/family education;Balance training    OT Goals(Current goals can be found in the care plan section) Acute Rehab OT Goals Patient Stated Goal: get better and be independent OT Goal Formulation: With patient Time For Goal Achievement: 05/15/22 Potential to Achieve Goals: Good ADL Goals Pt Will Perform Lower Body Dressing: with modified independence;with adaptive equipment;sit to/from stand Pt Will Transfer to Toilet: with supervision;ambulating (BSC over toilet, LRAD) Pt Will Perform Toileting - Clothing Manipulation and hygiene: with modified independence Additional ADL Goal #1: Pt will complete all aspects of bathing, primarily from seated position, with supervision for safety, LRAD PRN, 2/2 opportunities.  OT Frequency: Min 2X/week    Co-evaluation PT/OT/SLP Co-Evaluation/Treatment: Yes Reason for Co-Treatment: Necessary to address cognition/behavior during functional activity;For patient/therapist safety;To address functional/ADL transfers PT goals addressed during session: Mobility/safety with mobility;Balance;Proper use of DME OT  goals addressed during session: ADL's and self-care;Proper use of Adaptive equipment and DME      AM-PAC OT "6 Clicks" Daily Activity     Outcome Measure Help from another person eating meals?: None Help from another person taking care of personal grooming?: None Help from another person toileting, which includes using toliet, bedpan, or urinal?: A Lot Help from another person bathing (including washing, rinsing, drying)?: A Lot Help from  another person to put on and taking off regular upper body clothing?: A Little Help from another person to put on and taking off regular lower body clothing?: A Lot 6 Click Score: 17   End of Session Equipment Utilized During Treatment: Gait belt;Rolling walker (2 wheels) Nurse Communication: Mobility status;Patient requests pain meds  Activity Tolerance: Other (comment);Patient limited by pain (emotional lability) Patient left: in chair;with call bell/phone within reach;with chair alarm set  OT Visit Diagnosis: Other abnormalities of gait and mobility (R26.89);Muscle weakness (generalized) (M62.81)                Time: 4536-4680 OT Time Calculation (min): 34 min Charges:  OT General Charges $OT Visit: 1 Visit OT Evaluation $OT Eval Moderate Complexity: 1 Mod OT Treatments $Self Care/Home Management : 8-22 mins  Ardeth Perfect., MPH, MS, OTR/L ascom [phone] 05/01/22, 4:45 PM

## 2022-05-01 NOTE — Plan of Care (Signed)

## 2022-05-01 NOTE — Progress Notes (Signed)
[name] MRN: [id number] DOB: [birth year]   Cancelled Treatment:    Reason Eval/Treat Not Completed: Other (comment). Pt out of room at this time, PT to re-attempt as able.    Lieutenant Diego PT, DPT 11:34 AM,05/01/22

## 2022-05-01 NOTE — Progress Notes (Addendum)
PROGRESS NOTE    [name]  POE:[id number] DOB: 01/25/1961 DOA: 04/28/2022 PCP: Denton Lank, MD    Assessment & Plan:   Principal Problem:   Fever Active Problems:   End-stage renal disease on hemodialysis ([place])   HTN (hypertension)   Diabetes mellitus, type II ([place])   Chronic diastolic CHF (congestive heart failure) ([place])  Assessment and Plan: Fever: etiology unclear, possibly secondary left great toe wound. Left great toe wound cx growing gram positive cocci, gram positive rods. Started on doxycyline. Afebrile x 48 hrs at least. Blood cxs NGTD. COVID19 & viral respiratory panel are both neg   Left great toe wound: present on admission. Wound cx growing gram positive cocci, gram positive rods, sens pending. Started on doxycycline. Continue w/ wound care   ESRD: on HD TTS. Nephro following and recs apprec  Hyperkalemia: will get HD today   ACD: secondary to ESRD. Will transfuse if Hb < 7.0   HTN: continue on amlodipine. IV hydralazine prn   DM2: HbA1c 6.2, well controlled. Carb modified diet    Chronic diastolic CHF: volume management w/ HD    Thrombocytopenia: etiology unclear. Will continue to monitor   Obesity: BMI 30.9. Complicates overall care & prognosis    DVT prophylaxis: heparin  Code Status:  full  Family Communication:  Disposition Plan: likely d/c back home  Level of care: Med-Surg  Status is: Inpatient Remains inpatient appropriate because: severity of illness   Consultants:  Nephro   Procedures:   Antimicrobials:    Subjective: Pt c/o fatigue   Objective: Vitals:   04/30/22 1138 04/30/22 1546 04/30/22 2038 05/01/22 0539  BP: (!) 135/50 (!) 120/50 (!) 139/51 (!) 142/55  Pulse: 68 67 67 67  Resp: '18 16 18 17  '$ Temp: 98.4 F (36.9 C) 98 F (36.7 C) 98.1 F (36.7 C) 98.3 F (36.8 C)  TempSrc: Oral Oral    SpO2: 100% 100% 100% 100%  Weight:      Height:       No intake or output data in the 24 hours ending 05/01/22 0800  Filed  Weights   04/29/22 1338 04/29/22 1725 04/30/22 0500  Weight: 78.8 kg 77.4 kg 78.9 kg    Examination:  General exam: Appears comfortable   Respiratory system: diminished breath sounds b/l  Cardiovascular system: S1/S2+. No rubs or clicks  Gastrointestinal system: Abd is soft, NT, obese & normal bowel sounds  Central nervous system: Alert and awake. Moves all extremities  Extremities: left great toe wound that is small, circular, some pus noted & erythema. Psychiatry: Judgement and insight appears normal. Flat mood and affect     Data Reviewed: I have personally reviewed following labs and imaging studies  CBC: Recent Labs  Lab 04/28/22 1759 04/29/22 0602 04/30/22 0402 05/01/22 0352  WBC 9.6 11.6* 7.0 5.7  NEUTROABS 7.5  --   --   --   HGB 8.8* 8.0* 7.9* 7.7*  HCT 27.5* 24.6* 23.8* 23.1*  MCV 94.5 96.1 93.7 92.4  PLT 176 149* 139* 315*   Basic Metabolic Panel: Recent Labs  Lab 04/28/22 1759 04/29/22 0602 04/30/22 0402 05/01/22 0352  NA 137 133* 134* 134*  K 5.1 5.0 4.7 5.3*  CL 95* 96* 97* 95*  CO2 '28 28 28 27  '$ GLUCOSE 90 63* 97 86  BUN 37* 46* 22 33*  CREATININE 5.79* 6.63* 4.07* 5.18*  CALCIUM 8.7* 7.9* 8.0* 8.1*  MG  --   --  1.8 2.0   GFR: Estimated  Creatinine Clearance: 11 mL/min (A) (by C-G formula based on SCr of 5.18 mg/dL (H)). Liver Function Tests: Recent Labs  Lab 04/28/22 1759 04/29/22 0602  AST 24 43*  ALT 17 32  ALKPHOS 91 74  BILITOT 0.6 1.0  PROT 7.6 6.1*  ALBUMIN 3.9 2.8*   No results for input(s): "LIPASE", "AMYLASE" in the last 168 hours. No results for input(s): "AMMONIA" in the last 168 hours. Coagulation Profile: Recent Labs  Lab 04/28/22 1759  INR 1.0   Cardiac Enzymes: Recent Labs  Lab 04/28/22 1759  CKTOTAL 35*   BNP (last 3 results) No results for input(s): "PROBNP" in the last 8760 hours. HbA1C: No results for input(s): "HGBA1C" in the last 72 hours. CBG: Recent Labs  Lab 04/29/22 2108 04/30/22 0732  04/30/22 1141 04/30/22 1547 04/30/22 2134  GLUCAP 104* 97 124* 146* 153*   Lipid Profile: No results for input(s): "CHOL", "HDL", "LDLCALC", "TRIG", "CHOLHDL", "LDLDIRECT" in the last 72 hours. Thyroid Function Tests: Recent Labs    04/28/22 1759  TSH 2.614  FREET4 0.90   Anemia Panel: No results for input(s): "VITAMINB12", "FOLATE", "FERRITIN", "TIBC", "IRON", "RETICCTPCT" in the last 72 hours. Sepsis Labs: Recent Labs  Lab 04/28/22 1759 04/28/22 2108 04/29/22 0602  PROCALCITON  --   --  4.45  LATICACIDVEN 1.6 1.4  --     Recent Results (from the past 240 hour(s))  Culture, blood (Routine x 2)     Status: None (Preliminary result)   Collection Time: 04/28/22  5:59 PM   Specimen: BLOOD  Result Value Ref Range Status   Specimen Description BLOOD BLOOD RIGHT FOREARM  Final   Special Requests   Final    BOTTLES DRAWN AEROBIC AND ANAEROBIC Blood Culture adequate volume   Culture   Final    NO GROWTH 3 DAYS Performed at [facility], [address] [place], [place] [address]    Report Status PENDING  Incomplete  Culture, blood (Routine x 2)     Status: None (Preliminary result)   Collection Time: 04/28/22  6:04 PM   Specimen: BLOOD  Result Value Ref Range Status   Specimen Description BLOOD BLOOD RIGHT FOREARM  Final   Special Requests   Final    BOTTLES DRAWN AEROBIC AND ANAEROBIC Blood Culture adequate volume   Culture   Final    NO GROWTH 3 DAYS Performed at [facility], [address], [place], [place] [address]    Report Status PENDING  Incomplete  SARS Coronavirus 2 by RT PCR (hospital order, performed in [place] hospital lab) *cepheid single result test* Anterior Nasal Swab     Status: None   Collection Time: 04/28/22  8:09 PM   Specimen: Anterior Nasal Swab  Result Value Ref Range Status   SARS Coronavirus 2 by RT PCR NEGATIVE NEGATIVE Final    Comment: (NOTE) SARS-CoV-2 target nucleic acids are NOT DETECTED.  The SARS-CoV-2 RNA  is generally detectable in upper and lower respiratory specimens during the acute phase of infection. The lowest concentration of SARS-CoV-2 viral copies this assay can detect is 250 copies / mL. A negative result does not preclude SARS-CoV-2 infection and should not be used as the sole basis for treatment or other patient management decisions.  A negative result may occur with improper specimen collection / handling, submission of specimen other than nasopharyngeal swab, presence of viral mutation(s) within the areas targeted by this assay, and inadequate number of viral copies (<250 copies / mL). A negative  result must be combined with clinical observations, patient history, and epidemiological information.  Fact Sheet for Patients:   https://www.patel.info/  Fact Sheet for Healthcare Providers: https://hall.com/  This test is not yet approved or  cleared by the [place] FDA and has been authorized for detection and/or diagnosis of SARS-CoV-2 by FDA under an Emergency Use Authorization (EUA).  This EUA will remain in effect (meaning this test can be used) for the duration of the COVID-19 declaration under Section 564(b)(1) of the Act, 21 U.S.C. section 360bbb-3(b)(1), unless the authorization is terminated or revoked sooner.  Performed at [facility], [place], [place] [address]   Respiratory (~20 pathogens) panel by PCR     Status: None   Collection Time: 04/29/22  1:15 PM   Specimen: Nasopharyngeal Swab; Respiratory  Result Value Ref Range Status   Adenovirus NOT DETECTED NOT DETECTED Final   Coronavirus 229E NOT DETECTED NOT DETECTED Final    Comment: (NOTE) The Coronavirus on the Respiratory Panel, DOES NOT test for the novel  Coronavirus (2019 nCoV)    Coronavirus HKU1 NOT DETECTED NOT DETECTED Final   Coronavirus NL63 NOT DETECTED NOT DETECTED Final   Coronavirus OC43 NOT DETECTED NOT DETECTED  Final   Metapneumovirus NOT DETECTED NOT DETECTED Final   Rhinovirus / Enterovirus NOT DETECTED NOT DETECTED Final   Influenza A NOT DETECTED NOT DETECTED Final   Influenza B NOT DETECTED NOT DETECTED Final   Parainfluenza Virus 1 NOT DETECTED NOT DETECTED Final   Parainfluenza Virus 2 NOT DETECTED NOT DETECTED Final   Parainfluenza Virus 3 NOT DETECTED NOT DETECTED Final   Parainfluenza Virus 4 NOT DETECTED NOT DETECTED Final   Respiratory Syncytial Virus NOT DETECTED NOT DETECTED Final   Bordetella pertussis NOT DETECTED NOT DETECTED Final   Bordetella Parapertussis NOT DETECTED NOT DETECTED Final   Chlamydophila pneumoniae NOT DETECTED NOT DETECTED Final   Mycoplasma pneumoniae NOT DETECTED NOT DETECTED Final    Comment: Performed at [facility], [place]. [address], [place], [place] [address]  Culture, blood (Routine X 2) w Reflex to ID Panel     Status: None (Preliminary result)   Collection Time: 04/30/22  1:20 AM   Specimen: BLOOD  Result Value Ref Range Status   Specimen Description BLOOD PORTA CATH  Final   Special Requests   Final    BOTTLES DRAWN AEROBIC AND ANAEROBIC Blood Culture adequate volume   Culture   Final    NO GROWTH 1 DAY Performed at [facility], [address], [place], [place] [address]    Report Status PENDING  Incomplete  Aerobic/Anaerobic Culture w Gram Stain (surgical/deep wound)     Status: None (Preliminary result)   Collection Time: 04/30/22 12:42 PM   Specimen: Wound  Result Value Ref Range Status   Specimen Description WOUND  Final   Special Requests LEFT FOOT  Final   Gram Stain   Final    NO WBC SEEN RARE GRAM POSITIVE COCCI RARE GRAM POSITIVE RODS Performed at [facility], [place] [address], [place], [place] [address]    Culture PENDING  Incomplete   Report Status PENDING  Incomplete         Radiology Studies: No results found.      Scheduled Meds:  amLODipine  10 mg Oral Daily   aspirin EC  81 mg Oral  Daily   butalbital-acetaminophen-caffeine  2 tablet Oral Once   Chlorhexidine Gluconate Cloth  6 each Topical Q0600  doxycycline  100 mg Oral Q12H   feeding supplement (NEPRO CARB STEADY)  237 mL Oral BID BM   gabapentin  100 mg Oral TID   heparin  5,000 Units Subcutaneous Q12H   insulin aspart  0-6 Units Subcutaneous TID WC   lipase/protease/amylase  36,000 Units Oral TID WC   mirtazapine  7.5 mg Oral QHS   multivitamin  1 tablet Oral Daily   pantoprazole  40 mg Oral Daily   polyethylene glycol  34 g Oral BID   saccharomyces boulardii  250 mg Oral BID   sevelamer carbonate  800 mg Oral TID WC   sodium chloride flush  3 mL Intravenous Q12H   Continuous Infusions:  anticoagulant sodium citrate       LOS: 3 days    Time spent: 30 mins    Wyvonnia Dusky, MD [facility] Pager [phone]  If 7PM-7AM, please contact night-coverage [email or web address] 05/01/2022, 8:00 AM

## 2022-05-02 ENCOUNTER — Inpatient Hospital Stay: Payer: Medicare Other

## 2022-05-02 DIAGNOSIS — J9601 Acute respiratory failure with hypoxia: Secondary | ICD-10-CM | POA: Diagnosis not present

## 2022-05-02 DIAGNOSIS — R509 Fever, unspecified: Secondary | ICD-10-CM | POA: Diagnosis not present

## 2022-05-02 DIAGNOSIS — S91109A Unspecified open wound of unspecified toe(s) without damage to nail, initial encounter: Secondary | ICD-10-CM | POA: Diagnosis not present

## 2022-05-02 LAB — CBC
HCT: 23.5 % — ABNORMAL LOW (ref 36.0–46.0)
Hemoglobin: 7.5 g/dL — ABNORMAL LOW (ref 12.0–15.0)
MCH: 30.4 pg (ref 26.0–34.0)
MCHC: 31.9 g/dL (ref 30.0–36.0)
MCV: 95.1 fL (ref 80.0–100.0)
Platelets: 136 10*3/uL — ABNORMAL LOW (ref 150–400)
RBC: 2.47 MIL/uL — ABNORMAL LOW (ref 3.87–5.11)
RDW: 14.1 % (ref 11.5–15.5)
WBC: 4.4 10*3/uL (ref 4.0–10.5)
nRBC: 0 % (ref 0.0–0.2)

## 2022-05-02 LAB — BASIC METABOLIC PANEL
Anion gap: 9 (ref 5–15)
BUN: 18 mg/dL (ref 8–23)
CO2: 29 mmol/L (ref 22–32)
Calcium: 8.2 mg/dL — ABNORMAL LOW (ref 8.9–10.3)
Chloride: 95 mmol/L — ABNORMAL LOW (ref 98–111)
Creatinine, Ser: 3.64 mg/dL — ABNORMAL HIGH (ref 0.44–1.00)
GFR, Estimated: 14 mL/min — ABNORMAL LOW (ref >60)
Glucose, Bld: 181 mg/dL — ABNORMAL HIGH (ref 70–99)
Potassium: 4.3 mmol/L (ref 3.5–5.1)
Sodium: 133 mmol/L — ABNORMAL LOW (ref 135–145)

## 2022-05-02 LAB — GLUCOSE, CAPILLARY
Glucose-Capillary: 158 mg/dL — ABNORMAL HIGH (ref 70–99)
Glucose-Capillary: 143 mg/dL — ABNORMAL HIGH (ref 70–99)
Glucose-Capillary: 229 mg/dL — ABNORMAL HIGH (ref 70–99)
Glucose-Capillary: 160 mg/dL — ABNORMAL HIGH (ref 70–99)

## 2022-05-02 LAB — MAGNESIUM: Magnesium: 1.8 mg/dL (ref 1.7–2.4)

## 2022-05-02 MED ORDER — CIPROFLOXACIN HCL 500 MG PO TABS
500.0000 mg | ORAL_TABLET | ORAL | Status: DC
  Administered 2022-05-03 – 2022-05-04 (×2): 500 mg via ORAL
  Filled 2022-05-02 (×2): qty 1

## 2022-05-02 MED ORDER — VITAMIN B-12 1000 MCG PO TABS
1000.0000 ug | ORAL_TABLET | Freq: Every day | ORAL | Status: DC
  Administered 2022-05-03 – 2022-05-05 (×3): 1000 ug via ORAL
  Filled 2022-05-02 (×3): qty 1

## 2022-05-02 MED ORDER — VITAMIN D 25 MCG (1000 UNIT) PO TABS
1000.0000 [IU] | ORAL_TABLET | Freq: Every day | ORAL | Status: DC
  Administered 2022-05-03 – 2022-05-05 (×3): 1000 [IU] via ORAL
  Filled 2022-05-02 (×3): qty 1

## 2022-05-02 MED ORDER — PANCRELIPASE (LIP-PROT-AMYL) 12000-38000 UNITS PO CPEP
72000.0000 [IU] | ORAL_CAPSULE | Freq: Two times a day (BID) | ORAL | Status: DC
  Administered 2022-05-02 – 2022-05-05 (×6): 72000 [IU] via ORAL
  Filled 2022-05-02 (×6): qty 6

## 2022-05-02 MED ORDER — SACCHAROMYCES BOULARDII 250 MG PO CAPS
250.0000 mg | ORAL_CAPSULE | Freq: Every day | ORAL | Status: DC
  Administered 2022-05-03 – 2022-05-05 (×3): 250 mg via ORAL
  Filled 2022-05-02 (×3): qty 1

## 2022-05-02 MED ORDER — PANCRELIPASE (LIP-PROT-AMYL) 12000-38000 UNITS PO CPEP
36000.0000 [IU] | ORAL_CAPSULE | ORAL | Status: DC
  Administered 2022-05-02 – 2022-05-05 (×7): 36000 [IU] via ORAL
  Filled 2022-05-02 (×5): qty 3

## 2022-05-02 MED ORDER — ATORVASTATIN CALCIUM 20 MG PO TABS
20.0000 mg | ORAL_TABLET | Freq: Every day | ORAL | Status: DC
  Administered 2022-05-02 – 2022-05-04 (×3): 20 mg via ORAL
  Filled 2022-05-02 (×3): qty 1

## 2022-05-02 MED ORDER — CALAMINE EX LOTN
1.0000 | TOPICAL_LOTION | CUTANEOUS | Status: DC | PRN
  Filled 2022-05-02: qty 5

## 2022-05-02 NOTE — Care Management Important Message (Signed)
Important Message  Patient Details  Name: [name] MRN: [id number] Date of Birth: 05-13-1961   Medicare Important Message Given:  Yes  Patient is in an isolation room so I reviewed the Important Message from Medicare with her by phone [phone]). She stated she understood her rights and I thanked her for time and wished her a speedy recovery.   [name] A Nelli Swalley 05/02/2022, 1:20 PM

## 2022-05-02 NOTE — Progress Notes (Signed)
[facility]  ROUNDING NOTE   Subjective:   [name] is a 61 y.o., female with past medical history of anemia, asthma, diabetes, CAD, PAD, GERD, and end stage renal disease on dialysis. Patient present to ED with fever that began yesterday. She has been admitted for Fever [R50.9] Fever, unspecified fever cause [R50.9]  Patient is known to our clinic and receives outpatient dialysis at [facility] on a TTS schedule.   Update Patient seen sitting up in bed, eating breakfast Alert Remains afebrile Dialysis yesterday, tolerated well.    Objective:  Vital signs in last 24 hours:  Temp:  [98.2 F (36.8 C)-99.6 F (37.6 C)] 98.8 F (37.1 C) (08/11 0840) Pulse Rate:  [67-74] 70 (08/11 0840) Resp:  [13-18] 18 (08/11 0840) BP: (125-164)/(41-72) 142/48 (08/11 0840) SpO2:  [77 %-100 %] 91 % (08/11 0840) Weight:  [76.5 kg-76.7 kg] 76.5 kg (08/11 0500)  Weight change:  Filed Weights   05/01/22 0805 05/01/22 1149 05/02/22 0500  Weight: 78.9 kg 76.7 kg 76.5 kg    Intake/Output: I/O last 3 completed shifts: In: -  Out: 1500 [Other:1500]   Intake/Output this shift:  No intake/output data recorded.  Physical Exam: General: NAD  Head: Normocephalic, atraumatic. Moist oral mucosal membranes  Eyes: Anicteric  Lungs:  Clear to auscultation, normal effort   Heart: Regular rate and rhythm  Abdomen:  Soft, nontender  Extremities: No peripheral edema.  Neurologic: Nonfocal, moving all four extremities  Skin: No lesions  Access: Right chest PermCath    Basic Metabolic Panel: Recent Labs  Lab 04/28/22 1759 04/29/22 0602 04/30/22 0402 05/01/22 0352 05/02/22 0411  NA 137 133* 134* 134* 133*  K 5.1 5.0 4.7 5.3* 4.3  CL 95* 96* 97* 95* 95*  CO2 '28 28 28 27 29  '$ GLUCOSE 90 63* 97 86 181*  BUN 37* 46* 22 33* 18  CREATININE 5.79* 6.63* 4.07* 5.18* 3.64*  CALCIUM 8.7* 7.9* 8.0* 8.1* 8.2*  MG  --   --  1.8 2.0 1.8     Liver Function Tests: Recent Labs  Lab  04/28/22 1759 04/29/22 0602  AST 24 43*  ALT 17 32  ALKPHOS 91 74  BILITOT 0.6 1.0  PROT 7.6 6.1*  ALBUMIN 3.9 2.8*    No results for input(s): "LIPASE", "AMYLASE" in the last 168 hours. No results for input(s): "AMMONIA" in the last 168 hours.  CBC: Recent Labs  Lab 04/28/22 1759 04/29/22 0602 04/30/22 0402 05/01/22 0352 05/02/22 0411  WBC 9.6 11.6* 7.0 5.7 4.4  NEUTROABS 7.5  --   --   --   --   HGB 8.8* 8.0* 7.9* 7.7* 7.5*  HCT 27.5* 24.6* 23.8* 23.1* 23.5*  MCV 94.5 96.1 93.7 92.4 95.1  PLT 176 149* 139* 131* 136*     Cardiac Enzymes: Recent Labs  Lab 04/28/22 1759  CKTOTAL 35*     BNP: Invalid input(s): "POCBNP"  CBG: Recent Labs  Lab 05/01/22 1207 05/01/22 1312 05/01/22 1548 05/01/22 2107 05/02/22 0831  GLUCAP 62* 93 92 132* 158*     Microbiology: Results for orders placed or performed during the hospital encounter of 04/28/22  Culture, blood (Routine x 2)     Status: None (Preliminary result)   Collection Time: 04/28/22  5:59 PM   Specimen: BLOOD  Result Value Ref Range Status   Specimen Description BLOOD BLOOD RIGHT FOREARM  Final   Special Requests   Final    BOTTLES DRAWN AEROBIC AND ANAEROBIC Blood Culture  adequate volume   Culture   Final    NO GROWTH 4 DAYS Performed at [facility], [address] [place], [place] [address]    Report Status PENDING  Incomplete  Culture, blood (Routine x 2)     Status: None (Preliminary result)   Collection Time: 04/28/22  6:04 PM   Specimen: BLOOD  Result Value Ref Range Status   Specimen Description BLOOD BLOOD RIGHT FOREARM  Final   Special Requests   Final    BOTTLES DRAWN AEROBIC AND ANAEROBIC Blood Culture adequate volume   Culture   Final    NO GROWTH 4 DAYS Performed at [facility], [address], [place], [place] [address]    Report Status PENDING  Incomplete  SARS Coronavirus 2 by RT PCR (hospital order, performed in [place] hospital lab) *cepheid single  result test* Anterior Nasal Swab     Status: None   Collection Time: 04/28/22  8:09 PM   Specimen: Anterior Nasal Swab  Result Value Ref Range Status   SARS Coronavirus 2 by RT PCR NEGATIVE NEGATIVE Final    Comment: (NOTE) SARS-CoV-2 target nucleic acids are NOT DETECTED.  The SARS-CoV-2 RNA is generally detectable in upper and lower respiratory specimens during the acute phase of infection. The lowest concentration of SARS-CoV-2 viral copies this assay can detect is 250 copies / mL. A negative result does not preclude SARS-CoV-2 infection and should not be used as the sole basis for treatment or other patient management decisions.  A negative result may occur with improper specimen collection / handling, submission of specimen other than nasopharyngeal swab, presence of viral mutation(s) within the areas targeted by this assay, and inadequate number of viral copies (<250 copies / mL). A negative result must be combined with clinical observations, patient history, and epidemiological information.  Fact Sheet for Patients:   https://www.patel.info/  Fact Sheet for Healthcare Providers: https://hall.com/  This test is not yet approved or  cleared by the [place] FDA and has been authorized for detection and/or diagnosis of SARS-CoV-2 by FDA under an Emergency Use Authorization (EUA).  This EUA will remain in effect (meaning this test can be used) for the duration of the COVID-19 declaration under Section 564(b)(1) of the Act, 21 U.S.C. section 360bbb-3(b)(1), unless the authorization is terminated or revoked sooner.  Performed at [facility], [place], [place] [address]   Respiratory (~20 pathogens) panel by PCR     Status: None   Collection Time: 04/29/22  1:15 PM   Specimen: Nasopharyngeal Swab; Respiratory  Result Value Ref Range Status   Adenovirus NOT DETECTED NOT DETECTED Final   Coronavirus 229E  NOT DETECTED NOT DETECTED Final    Comment: (NOTE) The Coronavirus on the Respiratory Panel, DOES NOT test for the novel  Coronavirus (2019 nCoV)    Coronavirus HKU1 NOT DETECTED NOT DETECTED Final   Coronavirus NL63 NOT DETECTED NOT DETECTED Final   Coronavirus OC43 NOT DETECTED NOT DETECTED Final   Metapneumovirus NOT DETECTED NOT DETECTED Final   Rhinovirus / Enterovirus NOT DETECTED NOT DETECTED Final   Influenza A NOT DETECTED NOT DETECTED Final   Influenza B NOT DETECTED NOT DETECTED Final   Parainfluenza Virus 1 NOT DETECTED NOT DETECTED Final   Parainfluenza Virus 2 NOT DETECTED NOT DETECTED Final   Parainfluenza Virus 3 NOT DETECTED NOT DETECTED Final   Parainfluenza Virus 4 NOT DETECTED NOT DETECTED Final   Respiratory Syncytial Virus NOT DETECTED NOT DETECTED Final  Bordetella pertussis NOT DETECTED NOT DETECTED Final   Bordetella Parapertussis NOT DETECTED NOT DETECTED Final   Chlamydophila pneumoniae NOT DETECTED NOT DETECTED Final   Mycoplasma pneumoniae NOT DETECTED NOT DETECTED Final    Comment: Performed at [facility], [place] [address], [place], [place] [address]  Culture, blood (Routine X 2) w Reflex to ID Panel     Status: None (Preliminary result)   Collection Time: 04/30/22  1:20 AM   Specimen: BLOOD  Result Value Ref Range Status   Specimen Description BLOOD PORTA CATH  Final   Special Requests   Final    BOTTLES DRAWN AEROBIC AND ANAEROBIC Blood Culture adequate volume   Culture   Final    NO GROWTH 2 DAYS Performed at [facility], [address], [place], [place] [address]    Report Status PENDING  Incomplete  Aerobic/Anaerobic Culture w Gram Stain (surgical/deep wound)     Status: None (Preliminary result)   Collection Time: 04/30/22 12:42 PM   Specimen: Wound  Result Value Ref Range Status   Specimen Description WOUND  Final   Special Requests LEFT FOOT  Final   Gram Stain   Final    NO WBC SEEN RARE GRAM POSITIVE COCCI RARE GRAM  POSITIVE RODS Performed at [facility], [place] [address], [place], [place] [address]    Culture FEW ENTEROBACTER CLOACAE  Final   Report Status PENDING  Incomplete   Organism ID, Bacteria ENTEROBACTER CLOACAE  Final      Susceptibility   Enterobacter cloacae - MIC*    CEFAZOLIN >=64 RESISTANT Resistant     CEFEPIME <=0.12 SENSITIVE Sensitive     CEFTAZIDIME <=1 SENSITIVE Sensitive     CIPROFLOXACIN <=0.25 SENSITIVE Sensitive     GENTAMICIN <=1 SENSITIVE Sensitive     IMIPENEM <=0.25 SENSITIVE Sensitive     TRIMETH/SULFA <=20 SENSITIVE Sensitive     PIP/TAZO <=4 SENSITIVE Sensitive     * FEW ENTEROBACTER CLOACAE    Coagulation Studies: No results for input(s): "LABPROT", "INR" in the last 72 hours.   Urinalysis: No results for input(s): "COLORURINE", "LABSPEC", "PHURINE", "GLUCOSEU", "HGBUR", "BILIRUBINUR", "KETONESUR", "PROTEINUR", "UROBILINOGEN", "NITRITE", "LEUKOCYTESUR" in the last 72 hours.  Invalid input(s): "APPERANCEUR"    Imaging: DG Chest Port 1 View  Result Date: 05/02/2022 CLINICAL DATA:  61 year old female with fever. Left great toe wound. End stage renal disease on dialysis. EXAM: PORTABLE CHEST 1 VIEW COMPARISON:  CT Chest, Abdomen, and Pelvis today are reported separately. 04/28/2022 and earlier. FINDINGS: Portable AP view at 0906 hours. Stable right chest dual lumen dialysis catheter. Mildly lower lung volumes since 04/28/2022. Mildly increased interstitial markings diffusely. Evidence of ongoing small bilateral pleural effusions. Stable cardiac size and mediastinal contours. Stable left subclavian and axillary region vascular stent. No pneumothorax. No consolidation. No acute osseous abnormality identified. Negative visible bowel gas. IMPRESSION: Mildly lower lung volumes since 04/28/2022 with increased vascular congestion/interstitial edema and ongoing small pleural effusions. Electronically Signed   By: Genevie Ann M.D.   On: 05/02/2022 09:17     Medications:       amLODipine  10 mg Oral Daily   aspirin EC  81 mg Oral Daily   Chlorhexidine Gluconate Cloth  6 each Topical Q0600   ciprofloxacin  500 mg Oral Q24H   feeding supplement (NEPRO CARB STEADY)  237 mL Oral BID BM   gabapentin  100 mg Oral TID   heparin  5,000 Units Subcutaneous Q12H   insulin aspart  0-6 Units Subcutaneous TID WC   lipase/protease/amylase  36,000 Units Oral With snacks   lipase/protease/amylase  72,000 Units Oral BID WC   mirtazapine  7.5 mg Oral QHS   multivitamin  1 tablet Oral Daily   pantoprazole  40 mg Oral Daily   polyethylene glycol  34 g Oral BID   saccharomyces boulardii  250 mg Oral BID   sevelamer carbonate  800 mg Oral TID WC   sodium chloride flush  3 mL Intravenous Q12H   acetaminophen **OR** acetaminophen, albuterol, hydrALAZINE, HYDROcodone-acetaminophen, nitroGLYCERIN, sevelamer carbonate **AND** sevelamer carbonate  Assessment/ Plan:  [name] is a 61 y.o.  female with past medical history of anemia, asthma, diabetes, CAD, PAD, GERD, and end stage renal disease on dialysis. Patient present to ED with fever that began yesterday. She has been admitted for Fever [R50.9] Fever, unspecified fever cause [R50.9]  [facility]S/Rt permcath  End stage renal disease on hemodialysis.   Dialysis received yesterday, UF 1.5L achieved. Next treatment scheduled for Saturday  2. Anemia of chronic kidney disease Lab Results  Component Value Date   HGB 7.5 (L) 05/02/2022    Mircera in outpatient clinic. Hgb not at goal.   3. Secondary Hyperparathyroidism: with outpatient labs: PTH 736, phosphorus 5.3, calcium 8.6 on 04/01/22.   Lab Results  Component Value Date   PTH 357 (H) 10/05/2018   CALCIUM 8.2 (L) 05/02/2022   CAION 1.09 (L) 04/13/2020   PHOS 4.9 (H) 12/03/2021   Continue sevelamer with meals.  Calcium and phosphorus within acceptable range.  4. Diabetes mellitus type II with chronic kidney disease: noninsulin dependent.  Most recent hemoglobin A1c is 6.2 on 11/15/21.   5. Fever, unknown etiology. Afebrile since admission. Blood cultures negative.    LOS: [place] 8/11/202311:16 AM

## 2022-05-02 NOTE — Progress Notes (Signed)
PROGRESS NOTE    [name]  IOE:[id number] DOB: 04-28-1961 DOA: 04/28/2022 PCP: Denton Lank, MD    Assessment & Plan:   Principal Problem:   Fever Active Problems:   End-stage renal disease on hemodialysis ([place])   HTN (hypertension)   Diabetes mellitus, type II ([place])   Chronic diastolic CHF (congestive heart failure) ([place])  Assessment and Plan: Fever: etiology unclear, possibly secondary left great toe wound. Left great toe wound cx growing enterobacter and abxs changed to cipro. Blood cxs NGTD. COVID19 & viral respiratory panel are both neg. No fever today so far  Left great toe wound: present on admission. Wound cx growing enterobacter cloacae. Changed abxs to po cipro. Continue w/ wound care   Dyspnea/Acute hypoxic respiratory failure: SaO2 dropped in 60s w/ ambulating today as per pt's nurse. CXR shows increased vascular congestion/interstitial edema & ongoing small pleural effusions. Continue on supplemental oxygen    ESRD: on HD TTS. Nephro recs apprec   Hyperkalemia: WNL today   ACD: secondary to ESRD. Will transfuse if Hb < 7.0  HTN: continue on amlodipine. IV hydralazine prn   DM2: well controlled, HbA1c 6.2. Carb modified diet   Chronic diastolic CHF: volume management w/ HD    Thrombocytopenia: etiology unclear, labile.   Obesity: BMI 30.8. Complicates overall care & prognosis    DVT prophylaxis: heparin  Code Status:  full  Family Communication:  Disposition Plan: likely d/c back home  Level of care: Med-Surg  Status is: Inpatient Remains inpatient appropriate because: severity of illness   Consultants:  Nephro   Procedures:   Antimicrobials:    Subjective: Pt c/o dizziness   Objective: Vitals:   05/01/22 1551 05/01/22 2101 05/02/22 0422 05/02/22 0500  BP: (!) 125/41 139/65 (!) 149/64   Pulse: 70 68 67   Resp: '16 18 18   '$ Temp: 99.6 F (37.6 C) 98.2 F (36.8 C) 98.9 F (37.2 C)   TempSrc:      SpO2: (!) 77% 100% 100%   Weight:     76.5 kg  Height:        Intake/Output Summary (Last 24 hours) at 05/02/2022 0831 Last data filed at 05/01/2022 1138 Gross per 24 hour  Intake --  Output 1500 ml  Net -1500 ml    Filed Weights   05/01/22 0805 05/01/22 1149 05/02/22 0500  Weight: 78.9 kg 76.7 kg 76.5 kg    Examination:  General exam: Appears uncomfortable  Respiratory system: decreased breath sounds  Cardiovascular system: S1 & S2+. No rubs or clicks  Gastrointestinal system: Abd is soft, NT,obese & hypoactive bowel sounds  Central nervous system: Alert and awake. Moves all extremities  Extremities: left great toe wound is dressed & dressing is C/D/I Psychiatry: judgement and insight appears at baseline. Flat mood and affect     Data Reviewed: I have personally reviewed following labs and imaging studies  CBC: Recent Labs  Lab 04/28/22 1759 04/29/22 0602 04/30/22 0402 05/01/22 0352 05/02/22 0411  WBC 9.6 11.6* 7.0 5.7 4.4  NEUTROABS 7.5  --   --   --   --   HGB 8.8* 8.0* 7.9* 7.7* 7.5*  HCT 27.5* 24.6* 23.8* 23.1* 23.5*  MCV 94.5 96.1 93.7 92.4 95.1  PLT 176 149* 139* 131* 182*   Basic Metabolic Panel: Recent Labs  Lab 04/28/22 1759 04/29/22 0602 04/30/22 0402 05/01/22 0352 05/02/22 0411  NA 137 133* 134* 134* 133*  K 5.1 5.0 4.7 5.3* 4.3  CL 95* 96* 97* 95*  95*  CO2 '28 28 28 27 29  '$ GLUCOSE 90 63* 97 86 181*  BUN 37* 46* 22 33* 18  CREATININE 5.79* 6.63* 4.07* 5.18* 3.64*  CALCIUM 8.7* 7.9* 8.0* 8.1* 8.2*  MG  --   --  1.8 2.0 1.8   GFR: Estimated Creatinine Clearance: 15.4 mL/min (A) (by C-G formula based on SCr of 3.64 mg/dL (H)). Liver Function Tests: Recent Labs  Lab 04/28/22 1759 04/29/22 0602  AST 24 43*  ALT 17 32  ALKPHOS 91 74  BILITOT 0.6 1.0  PROT 7.6 6.1*  ALBUMIN 3.9 2.8*   No results for input(s): "LIPASE", "AMYLASE" in the last 168 hours. No results for input(s): "AMMONIA" in the last 168 hours. Coagulation Profile: Recent Labs  Lab 04/28/22 1759  INR 1.0    Cardiac Enzymes: Recent Labs  Lab 04/28/22 1759  CKTOTAL 35*   BNP (last 3 results) No results for input(s): "PROBNP" in the last 8760 hours. HbA1C: No results for input(s): "HGBA1C" in the last 72 hours. CBG: Recent Labs  Lab 04/30/22 2134 05/01/22 1207 05/01/22 1312 05/01/22 1548 05/01/22 2107  GLUCAP 153* 62* 93 92 132*   Lipid Profile: No results for input(s): "CHOL", "HDL", "LDLCALC", "TRIG", "CHOLHDL", "LDLDIRECT" in the last 72 hours. Thyroid Function Tests: No results for input(s): "TSH", "T4TOTAL", "FREET4", "T3FREE", "THYROIDAB" in the last 72 hours.  Anemia Panel: No results for input(s): "VITAMINB12", "FOLATE", "FERRITIN", "TIBC", "IRON", "RETICCTPCT" in the last 72 hours. Sepsis Labs: Recent Labs  Lab 04/28/22 1759 04/28/22 2108 04/29/22 0602  PROCALCITON  --   --  4.45  LATICACIDVEN 1.6 1.4  --     Recent Results (from the past 240 hour(s))  Culture, blood (Routine x 2)     Status: None (Preliminary result)   Collection Time: 04/28/22  5:59 PM   Specimen: BLOOD  Result Value Ref Range Status   Specimen Description BLOOD BLOOD RIGHT FOREARM  Final   Special Requests   Final    BOTTLES DRAWN AEROBIC AND ANAEROBIC Blood Culture adequate volume   Culture   Final    NO GROWTH 4 DAYS Performed at [facility], [address], [place], [place] [address]    Report Status PENDING  Incomplete  Culture, blood (Routine x 2)     Status: None (Preliminary result)   Collection Time: 04/28/22  6:04 PM   Specimen: BLOOD  Result Value Ref Range Status   Specimen Description BLOOD BLOOD RIGHT FOREARM  Final   Special Requests   Final    BOTTLES DRAWN AEROBIC AND ANAEROBIC Blood Culture adequate volume   Culture   Final    NO GROWTH 4 DAYS Performed at [facility], [address], [place], [place] [address]    Report Status PENDING  Incomplete  SARS Coronavirus 2 by RT PCR (hospital order, performed in [place] hospital lab) *cepheid  single result test* Anterior Nasal Swab     Status: None   Collection Time: 04/28/22  8:09 PM   Specimen: Anterior Nasal Swab  Result Value Ref Range Status   SARS Coronavirus 2 by RT PCR NEGATIVE NEGATIVE Final    Comment: (NOTE) SARS-CoV-2 target nucleic acids are NOT DETECTED.  The SARS-CoV-2 RNA is generally detectable in upper and lower respiratory specimens during the acute phase of infection. The lowest concentration of SARS-CoV-2 viral copies this assay can detect is 250 copies / mL. A negative result does not preclude SARS-CoV-2 infection and should not be used as the  sole basis for treatment or other patient management decisions.  A negative result may occur with improper specimen collection / handling, submission of specimen other than nasopharyngeal swab, presence of viral mutation(s) within the areas targeted by this assay, and inadequate number of viral copies (<250 copies / mL). A negative result must be combined with clinical observations, patient history, and epidemiological information.  Fact Sheet for Patients:   https://www.patel.info/  Fact Sheet for Healthcare Providers: https://hall.com/  This test is not yet approved or  cleared by the [place] FDA and has been authorized for detection and/or diagnosis of SARS-CoV-2 by FDA under an Emergency Use Authorization (EUA).  This EUA will remain in effect (meaning this test can be used) for the duration of the COVID-19 declaration under Section 564(b)(1) of the Act, 21 U.S.C. section 360bbb-3(b)(1), unless the authorization is terminated or revoked sooner.  Performed at [facility], [place], [place] [address]   Respiratory (~20 pathogens) panel by PCR     Status: None   Collection Time: 04/29/22  1:15 PM   Specimen: Nasopharyngeal Swab; Respiratory  Result Value Ref Range Status   Adenovirus NOT DETECTED NOT DETECTED Final   Coronavirus  229E NOT DETECTED NOT DETECTED Final    Comment: (NOTE) The Coronavirus on the Respiratory Panel, DOES NOT test for the novel  Coronavirus (2019 nCoV)    Coronavirus HKU1 NOT DETECTED NOT DETECTED Final   Coronavirus NL63 NOT DETECTED NOT DETECTED Final   Coronavirus OC43 NOT DETECTED NOT DETECTED Final   Metapneumovirus NOT DETECTED NOT DETECTED Final   Rhinovirus / Enterovirus NOT DETECTED NOT DETECTED Final   Influenza A NOT DETECTED NOT DETECTED Final   Influenza B NOT DETECTED NOT DETECTED Final   Parainfluenza Virus 1 NOT DETECTED NOT DETECTED Final   Parainfluenza Virus 2 NOT DETECTED NOT DETECTED Final   Parainfluenza Virus 3 NOT DETECTED NOT DETECTED Final   Parainfluenza Virus 4 NOT DETECTED NOT DETECTED Final   Respiratory Syncytial Virus NOT DETECTED NOT DETECTED Final   Bordetella pertussis NOT DETECTED NOT DETECTED Final   Bordetella Parapertussis NOT DETECTED NOT DETECTED Final   Chlamydophila pneumoniae NOT DETECTED NOT DETECTED Final   Mycoplasma pneumoniae NOT DETECTED NOT DETECTED Final    Comment: Performed at [facility], [place]. [address] [place], [place] [address]  Culture, blood (Routine X 2) w Reflex to ID Panel     Status: None (Preliminary result)   Collection Time: 04/30/22  1:20 AM   Specimen: BLOOD  Result Value Ref Range Status   Specimen Description BLOOD PORTA CATH  Final   Special Requests   Final    BOTTLES DRAWN AEROBIC AND ANAEROBIC Blood Culture adequate volume   Culture   Final    NO GROWTH 2 DAYS Performed at [facility], [address] [place], [place] [address]    Report Status PENDING  Incomplete  Aerobic/Anaerobic Culture w Gram Stain (surgical/deep wound)     Status: None (Preliminary result)   Collection Time: 04/30/22 12:42 PM   Specimen: Wound  Result Value Ref Range Status   Specimen Description WOUND  Final   Special Requests LEFT FOOT  Final   Gram Stain   Final    NO WBC SEEN RARE GRAM POSITIVE COCCI RARE  GRAM POSITIVE RODS Performed at [facility], [place] [address], [place], [place] [address]    Culture FEW ENTEROBACTER CLOACAE  Final   Report Status PENDING  Incomplete  Organism ID, Bacteria ENTEROBACTER CLOACAE  Final      Susceptibility   Enterobacter cloacae - MIC*    CEFAZOLIN >=64 RESISTANT Resistant     CEFEPIME <=0.12 SENSITIVE Sensitive     CEFTAZIDIME <=1 SENSITIVE Sensitive     CIPROFLOXACIN <=0.25 SENSITIVE Sensitive     GENTAMICIN <=1 SENSITIVE Sensitive     IMIPENEM <=0.25 SENSITIVE Sensitive     TRIMETH/SULFA <=20 SENSITIVE Sensitive     PIP/TAZO <=4 SENSITIVE Sensitive     * FEW ENTEROBACTER CLOACAE         Radiology Studies: No results found.      Scheduled Meds:  amLODipine  10 mg Oral Daily   aspirin EC  81 mg Oral Daily   Chlorhexidine Gluconate Cloth  6 each Topical Q0600   ciprofloxacin  500 mg Oral BID   feeding supplement (NEPRO CARB STEADY)  237 mL Oral BID BM   gabapentin  100 mg Oral TID   heparin  5,000 Units Subcutaneous Q12H   insulin aspart  0-6 Units Subcutaneous TID WC   lipase/protease/amylase  36,000 Units Oral TID WC   mirtazapine  7.5 mg Oral QHS   multivitamin  1 tablet Oral Daily   pantoprazole  40 mg Oral Daily   polyethylene glycol  34 g Oral BID   saccharomyces boulardii  250 mg Oral BID   sevelamer carbonate  800 mg Oral TID WC   sodium chloride flush  3 mL Intravenous Q12H   Continuous Infusions:     LOS: 4 days    Time spent: 35 mins    Wyvonnia Dusky, MD [facility] Pager [phone]  If 7PM-7AM, please contact night-coverage [email or web address] 05/02/2022, 8:31 AM

## 2022-05-02 NOTE — Progress Notes (Signed)
Established hemodialysis patient known at [place] 6:45am. Patient stated no dialysis concerns.

## 2022-05-02 NOTE — Plan of Care (Signed)

## 2022-05-02 NOTE — TOC Initial Note (Signed)
Transition of Care [facility]) - Initial/Assessment Note    Patient Details  Name: [name] MRN: [id number] Date of Birth: 1960/11/11  Transition of Care [facility]) CM/SW Contact:    Laurena Slimmer, RN Phone Number: 05/02/2022, 2:49 PM  Clinical Narrative:                 Spoke with patient regarding her discharge plan and therapy recommendation. Patient has been to [facility] and would like to return. Bed search initiated.         Patient Goals and CMS Choice        Expected Discharge Plan and Services                                                Prior Living Arrangements/Services                       Activities of Daily Living Home Assistive Devices/Equipment: None ADL Screening (condition at time of admission) Patient's cognitive ability adequate to safely complete daily activities?: Yes Is the patient deaf or have difficulty hearing?: No Does the patient have difficulty seeing, even when wearing glasses/contacts?: No Does the patient have difficulty concentrating, remembering, or making decisions?: No Patient able to express need for assistance with ADLs?: Yes Does the patient have difficulty dressing or bathing?: Yes Independently performs ADLs?: No Does the patient have difficulty walking or climbing stairs?: Yes Weakness of Legs: Both Weakness of Arms/Hands: Both  Permission Sought/Granted                  Emotional Assessment              Admission diagnosis:  Fever [R50.9] Fever, unspecified fever cause [R50.9] Patient Active Problem List   Diagnosis Date Noted   Pericardial effusion 12/05/2021   Hearing impairment 12/04/2021   Hyponatremia 11/27/2021   COVID-19 virus infection 11/27/2021   Bacterial pneumonia 11/27/2021   Acute GI bleeding 11/15/2021   Upper GI bleed 11/15/2021   Anemia of chronic kidney failure 05/03/2021   Thrombocytopenia ([place]) 05/03/2021   Severe sepsis with septic shock ([place]) 04/25/2021   HCAP  (healthcare-associated pneumonia) 16/03/3709   Acute metabolic encephalopathy 62/69/4854   UTI (urinary tract infection) 03/29/2021   Chronic respiratory failure with hypoxia ([place]) 12/18/2020   Superior vena caval syndrome 12/14/2020   Chronic diastolic CHF (congestive heart failure) ([place]) 12/04/2020   Bradycardia 09/17/2020   Advanced care planning/counseling discussion    Discitis of thoracic region    Fluid overload 08/10/2020   HLD (hyperlipidemia) 08/10/2020   Chest pain 08/10/2020   CAD (coronary artery disease) 08/10/2020   GERD (gastroesophageal reflux disease) 08/10/2020   Obesity (BMI 30-39.9) 06/06/2020   Anemia in ESRD (end-stage renal disease) ([place]) 05/31/2020   Type 1 diabetes mellitus ([place]) 05/31/2020   End-stage renal disease ([place]) 05/31/2020   Hypertensive disorder 05/31/2020   Arm pain 05/21/2020   Cellulitis of right leg 05/02/2020   ESRD (end stage renal disease) ([place]) 04/13/2020   SOBOE (shortness of breath on exertion) 02/08/2020   Hematoma of arm, left, subsequent encounter 11/24/2019   Respiratory failure (HCC)    Shortness of breath    Complication of arteriovenous dialysis fistula 11/13/2019   PVD (peripheral vascular disease) ([place]) 11/13/2019   Severe anemia 11/13/2019   Pressure ulcer, stage 2 ([place])  11/13/2019   Hemorrhagic shock ([place]) 11/11/2019   Leg pain 10/02/2019   CAP (community acquired pneumonia) 10/01/2019   Atherosclerosis of native arteries of extremity with intermittent claudication ([place]) 05/01/2019   Right-sided headache    COPD (chronic obstructive pulmonary disease) ([place]) 10/06/2018   Syncope 10/04/2018   Symptomatic anemia 06/18/2018   Gastroparesis due to DM ([place]) 62/83/1517   Complication of vascular access for dialysis 12/04/2017   Osteomyelitis ([place]) 09/30/2017   Carotid stenosis 06/18/2017   Bilateral carotid artery stenosis 06/16/2017   Benign essential HTN 05/19/2017   CKD (chronic kidney disease) stage 5, GFR less than 15  ml/min ([place]) 05/19/2017   Hyperlipidemia, mixed 05/19/2017   Shortness of breath 05/04/2017   Rash, skin 12/16/2016   Cellulitis of lower extremity 07/29/2016   Chronic venous insufficiency 07/29/2016   Lymphedema 07/29/2016   TIA (transient ischemic attack) 04/21/2016   Altered mental status 04/08/2016   Hyperammonemia ([place]) 04/08/2016   Elevated troponin 04/08/2016   Depression 04/08/2016   Depression, major, recurrent, severe with psychosis ([place]) 04/08/2016   Blood in stool    Intractable cyclical vomiting with nausea    Reflux esophagitis    Gastritis    Generalized abdominal pain    Uncontrollable vomiting    Major depressive disorder, recurrent episode, moderate ([place]) 03/15/2016   Adjustment disorder with mixed anxiety and depressed mood 03/15/2016   Malnutrition of moderate degree 12/01/2015   Renal mass    Dyspnea    Acute renal failure ([place])    Respiratory failure ([place])    Fever 11/14/2015   Encounter for central line placement    Encounter for orogastric (OG) tube placement    Nausea 11/12/2015   Hyperkalemia 10/03/2015   Diarrhea, unspecified 07/22/2015   Hypoglycemia 04/24/2015   Unresponsiveness 04/24/2015   Sinus bradycardia 04/24/2015   Hypothermia 04/24/2015   Acute hypoxemic respiratory failure ([place]) 04/24/2015   Acute on chronic diastolic CHF (congestive heart failure) ([place]) 04/05/2015   Diabetic gastroparesis ([place]) 04/05/2015   Hypokalemia 04/05/2015   Physical debility 04/05/2015   Acute pulmonary edema ([place]) 04/03/2015   Nausea and vomiting 04/03/2015   Hypoglycemia associated with diabetes ([place]) 04/03/2015   Anemia of chronic disease 04/03/2015   Secondary hyperparathyroidism ([place]) 04/03/2015   Pressure ulcer 04/02/2015   Acute respiratory failure with hypoxia ([place]) 04/01/2015   Adjustment disorder with anxiety 03/14/2015   Somatic symptom disorder, mild 03/08/2015   Coronary artery disease involving native coronary artery of native heart  without angina pectoris    Nausea & vomiting 03/06/2015   Abdominal pain 03/06/2015   Diabetes mellitus, type II ([place]) 03/06/2015   HTN (hypertension) 03/06/2015   Gastroparesis 02/24/2015   Pleural effusion 02/19/2015   Bacteremia due to Enterococcus 02/19/2015   End-stage renal disease on hemodialysis ([place]) 02/19/2015   Diarrhea 08/01/2014   PCP:  Denton Lank, MD Pharmacy:   [id number] [id number]: [phone]: [phone]    Social Determinants of Health (SDOH) Interventions    Readmission Risk Interventions    05/03/2021    3:02 PM 12/11/2020    9:23 AM 09/21/2020    9:39 AM  Readmission Risk Prevention Plan  Transportation Screening Complete Complete Complete  Medication Review (RN Care Manager) Complete Complete Complete  PCP or Specialist appointment within 3-5 days of discharge  Complete Complete  HRI or Home Care Consult  Complete Complete  SW Recovery Care/Counseling Consult  Complete  Complete  Palliative Care Screening Complete Not Applicable Complete  Skilled Nursing Facility  Not Complete Complete  SNF Comments  working on placement

## 2022-05-02 NOTE — Progress Notes (Signed)
PHARMACY NOTE:  ANTIMICROBIAL RENAL DOSAGE ADJUSTMENT  Current antimicrobial regimen includes a mismatch between antimicrobial dosage and estimated renal function.  As per policy approved by the Pharmacy & Therapeutics and Medical Executive Committees, the antimicrobial dosage will be adjusted accordingly.  Current antimicrobial dosage:  Cipro '500mg'$  every 12 hours   Indication:  left great toe wound  Renal Function:  Estimated Creatinine Clearance: 15.4 mL/min (A) (by C-G formula based on SCr of 3.64 mg/dL (H)). '[x]'$      On intermittent HD, scheduled: TTH     Antimicrobial dosage has been changed to:  Cipro '500mg'$  every 24 hours for ESRD   Thank you for allowing pharmacy to be a part of this patient's care.  Pernell Dupre, PharmD, BCPS Clinical Pharmacist 05/02/2022 8:40 AM

## 2022-05-02 NOTE — NC FL2 (Signed)
[place] LEVEL OF CARE SCREENING TOOL     IDENTIFICATION  Patient Name: [name] Birthdate: [birth year] Sex: female Admission Date (Current Location): 04/28/2022  [facility] and [place] Number:  Engineering geologist and Address:  [facility], [address] [place], [place] [address]      Provider Number: [id number]  Attending Physician Name and Address:  Wyvonnia Dusky, MD  Relative Name and Phone Number:  [name] [phone]    Current Level of Care: Hospital Recommended Level of Care: [place] Prior Approval Number:    Date Approved/Denied:   PASRR Number: [phone] [phone]  Discharge Plan: SNF    Current Diagnoses: Patient Active Problem List   Diagnosis Date Noted   Pericardial effusion 12/05/2021   Hearing impairment 12/04/2021   Hyponatremia 11/27/2021   COVID-19 virus infection 11/27/2021   Bacterial pneumonia 11/27/2021   Acute GI bleeding 11/15/2021   Upper GI bleed 11/15/2021   Anemia of chronic kidney failure 05/03/2021   Thrombocytopenia ([place]) 05/03/2021   Severe sepsis with septic shock ([place]) 04/25/2021   HCAP (healthcare-associated pneumonia) 83/38/2505   Acute metabolic encephalopathy 39/76/7341   UTI (urinary tract infection) 03/29/2021   Chronic respiratory failure with hypoxia ([place]) 12/18/2020   Superior vena caval syndrome 12/14/2020   Chronic diastolic CHF (congestive heart failure) ([place]) 12/04/2020   Bradycardia 09/17/2020   Advanced care planning/counseling discussion    Discitis of thoracic region    Fluid overload 08/10/2020   HLD (hyperlipidemia) 08/10/2020   Chest pain 08/10/2020   CAD (coronary artery disease) 08/10/2020   GERD (gastroesophageal reflux disease) 08/10/2020   Obesity (BMI 30-39.9) 06/06/2020   Anemia in ESRD (end-stage renal disease) ([place]) 05/31/2020   Type 1 diabetes mellitus ([place]) 05/31/2020   End-stage renal disease ([place]) 05/31/2020   Hypertensive disorder  05/31/2020   Arm pain 05/21/2020   Cellulitis of right leg 05/02/2020   ESRD (end stage renal disease) ([place]) 04/13/2020   SOBOE (shortness of breath on exertion) 02/08/2020   Hematoma of arm, left, subsequent encounter 11/24/2019   Respiratory failure (HCC)    Shortness of breath    Complication of arteriovenous dialysis fistula 11/13/2019   PVD (peripheral vascular disease) ([place]) 11/13/2019   Severe anemia 11/13/2019   Pressure ulcer, stage 2 ([place]) 11/13/2019   Hemorrhagic shock ([place]) 11/11/2019   Leg pain 10/02/2019   CAP (community acquired pneumonia) 10/01/2019   Atherosclerosis of native arteries of extremity with intermittent claudication ([place]) 05/01/2019   Right-sided headache    COPD (chronic obstructive pulmonary disease) ([place]) 10/06/2018   Syncope 10/04/2018   Symptomatic anemia 06/18/2018   Gastroparesis due to DM ([place]) 93/79/0240   Complication of vascular access for dialysis 12/04/2017   Osteomyelitis ([place]) 09/30/2017   Carotid stenosis 06/18/2017   Bilateral carotid artery stenosis 06/16/2017   Benign essential HTN 05/19/2017   CKD (chronic kidney disease) stage 5, GFR less than 15 ml/min ([place]) 05/19/2017   Hyperlipidemia, mixed 05/19/2017   Shortness of breath 05/04/2017   Rash, skin 12/16/2016   Cellulitis of lower extremity 07/29/2016   Chronic venous insufficiency 07/29/2016   Lymphedema 07/29/2016   TIA (transient ischemic attack) 04/21/2016   Altered mental status 04/08/2016   Hyperammonemia ([place]) 04/08/2016   Elevated troponin 04/08/2016   Depression 04/08/2016   Depression, major, recurrent, severe with psychosis ([place]) 04/08/2016   Blood in stool    Intractable cyclical vomiting with nausea    Reflux esophagitis    Gastritis    Generalized  abdominal pain    Uncontrollable vomiting    Major depressive disorder, recurrent episode, moderate (HCC) 03/15/2016   Adjustment disorder with mixed anxiety and depressed mood 03/15/2016   Malnutrition of  moderate degree 12/01/2015   Renal mass    Dyspnea    Acute renal failure (HCC)    Respiratory failure ([place])    Fever 11/14/2015   Encounter for central line placement    Encounter for orogastric (OG) tube placement    Nausea 11/12/2015   Hyperkalemia 10/03/2015   Diarrhea, unspecified 07/22/2015   Hypoglycemia 04/24/2015   Unresponsiveness 04/24/2015   Sinus bradycardia 04/24/2015   Hypothermia 04/24/2015   Acute hypoxemic respiratory failure ([place]) 04/24/2015   Acute on chronic diastolic CHF (congestive heart failure) ([place]) 04/05/2015   Diabetic gastroparesis ([place]) 04/05/2015   Hypokalemia 04/05/2015   Physical debility 04/05/2015   Acute pulmonary edema (HCC) 04/03/2015   Nausea and vomiting 04/03/2015   Hypoglycemia associated with diabetes ([place]) 04/03/2015   Anemia of chronic disease 04/03/2015   Secondary hyperparathyroidism ([place]) 04/03/2015   Pressure ulcer 04/02/2015   Acute respiratory failure with hypoxia (HCC) 04/01/2015   Adjustment disorder with anxiety 03/14/2015   Somatic symptom disorder, mild 03/08/2015   Coronary artery disease involving native coronary artery of native heart without angina pectoris    Nausea & vomiting 03/06/2015   Abdominal pain 03/06/2015   Diabetes mellitus, type II ([place]) 03/06/2015   HTN (hypertension) 03/06/2015   Gastroparesis 02/24/2015   Pleural effusion 02/19/2015   Bacteremia due to Enterococcus 02/19/2015   End-stage renal disease on hemodialysis ([place]) 02/19/2015   Diarrhea 08/01/2014    Orientation RESPIRATION BLADDER Height & Weight     Self, Time, Situation, Place  O2 Continent Weight: 76.5 kg Height:  '5\' 2"'$  (157.5 cm)  BEHAVIORAL SYMPTOMS/MOOD NEUROLOGICAL BOWEL NUTRITION STATUS   (n/a)  (n/a) Continent Diet (Carb modified)  AMBULATORY STATUS COMMUNICATION OF NEEDS Skin   Limited Assist Verbally Normal                       Personal Care Assistance Level of Assistance  Bathing, Dressing Bathing Assistance:  Limited assistance         Functional Limitations Info             SPECIAL CARE FACTORS FREQUENCY  PT (By licensed PT), OT (By licensed OT)     PT Frequency: Min 2x weekly OT Frequency: Min 2x weekly            Contractures Contractures Info: Not present    Additional Factors Info  Code Status, Allergies Code Status Info: FULL Allergies Info: : Ace Inhibitors, Ativan (Lorazepam), Compazine (Prochlorperazine Edisylate), Sumatriptan Succinate, Zofran (Ondansetron), Losartan, Prochlorperazine, Reglan (Metoclopramide), Scopolamine, Tape, Tapentadol, Ultrasound Gel           Current Medications (05/02/2022):  This is the current hospital active medication list Current Facility-Administered Medications  Medication Dose Route Frequency Provider Last Rate Last Admin   acetaminophen (TYLENOL) tablet 650 mg  650 mg Oral Q6H PRN Para Skeans, MD   650 mg at 04/29/22 1051   Or   acetaminophen (TYLENOL) suppository 650 mg  650 mg Rectal Q6H PRN Para Skeans, MD       albuterol (PROVENTIL) (2.5 MG/3ML) 0.083% nebulizer solution 2.5 mg  2.5 mg Nebulization Q4H PRN Para Skeans, MD       amLODipine (NORVASC) tablet 10 mg  10 mg Oral Daily Para Skeans, MD   10  mg at 05/02/22 0801   aspirin EC tablet 81 mg  81 mg Oral Daily Enzo Bi, MD   81 mg at 05/02/22 0801   atorvastatin (LIPITOR) tablet 20 mg  20 mg Oral QHS Wyvonnia Dusky, MD       calamine lotion 1 Application  1 Application Topical PRN Wyvonnia Dusky, MD       Chlorhexidine Gluconate Cloth 2 % PADS 6 each  6 each Topical Q0600 Colon Flattery, NP   6 each at 05/02/22 0642   [START ON 05/03/2022] cholecalciferol (VITAMIN D3) 25 MCG (1000 UNIT) tablet 1,000 Units  1,000 Units Oral Daily Wyvonnia Dusky, MD       ciprofloxacin (CIPRO) tablet 500 mg  500 mg Oral Q24H Wyvonnia Dusky, MD       [START ON 05/03/2022] cyanocobalamin (VITAMIN B12) tablet 1,000 mcg  1,000 mcg Oral Daily Wyvonnia Dusky, MD        feeding supplement (NEPRO CARB STEADY) liquid 237 mL  237 mL Oral BID BM Enzo Bi, MD       gabapentin (NEURONTIN) capsule 100 mg  100 mg Oral TID Enzo Bi, MD   100 mg at 05/02/22 0801   heparin injection 5,000 Units  5,000 Units Subcutaneous Q12H Para Skeans, MD   5,000 Units at 05/02/22 0802   hydrALAZINE (APRESOLINE) injection 10 mg  10 mg Intravenous Q6H PRN Para Skeans, MD       HYDROcodone-acetaminophen (NORCO/VICODIN) 5-325 MG per tablet 1 tablet  1 tablet Oral Q4H PRN Para Skeans, MD   1 tablet at 05/02/22 0805   insulin aspart (novoLOG) injection 0-6 Units  0-6 Units Subcutaneous TID WC Para Skeans, MD   1 Units at 05/02/22 0934   lipase/protease/amylase (CREON) capsule 36,000 Units  36,000 Units Oral With snacks Wyvonnia Dusky, MD   36,000 Units at 05/02/22 1222   lipase/protease/amylase (CREON) capsule 72,000 Units  72,000 Units Oral BID WC Wyvonnia Dusky, MD       mirtazapine (REMERON) tablet 7.5 mg  7.5 mg Oral QHS Florina Ou V, MD   7.5 mg at 05/01/22 2130   multivitamin (RENA-VIT) tablet 1 tablet  1 tablet Oral Daily Para Skeans, MD   1 tablet at 05/02/22 0801   nitroGLYCERIN (NITROSTAT) SL tablet 0.4 mg  0.4 mg Sublingual Q5 min PRN Para Skeans, MD       pantoprazole (PROTONIX) EC tablet 40 mg  40 mg Oral Daily Enzo Bi, MD   40 mg at 05/02/22 0801   polyethylene glycol (MIRALAX / GLYCOLAX) packet 34 g  34 g Oral BID Enzo Bi, MD   34 g at 04/30/22 2148   [START ON 05/03/2022] saccharomyces boulardii (FLORASTOR) capsule 250 mg  250 mg Oral Daily Wyvonnia Dusky, MD       sevelamer carbonate (RENVELA) tablet 800 mg  800 mg Oral TID WC Renda Rolls, RPH   800 mg at 05/02/22 1217   And   sevelamer carbonate (RENVELA) tablet 800 mg  800 mg Oral PRN Renda Rolls, RPH   800 mg at 05/01/22 2130   sodium chloride flush (NS) 0.9 % injection 3 mL  3 mL Intravenous Q12H Para Skeans, MD   3 mL at 05/02/22 0802     Discharge Medications: Please see  discharge summary for a list of discharge medications.  Relevant Imaging Results:  Relevant Lab Results:   Additional Information SS# [id number]  Laurena Slimmer, RN

## 2022-05-03 DIAGNOSIS — Z992 Dependence on renal dialysis: Secondary | ICD-10-CM | POA: Diagnosis not present

## 2022-05-03 DIAGNOSIS — R531 Weakness: Secondary | ICD-10-CM | POA: Diagnosis not present

## 2022-05-03 DIAGNOSIS — N186 End stage renal disease: Secondary | ICD-10-CM | POA: Diagnosis not present

## 2022-05-03 DIAGNOSIS — S91109A Unspecified open wound of unspecified toe(s) without damage to nail, initial encounter: Secondary | ICD-10-CM | POA: Diagnosis not present

## 2022-05-03 LAB — GLUCOSE, CAPILLARY
Glucose-Capillary: 132 mg/dL — ABNORMAL HIGH (ref 70–99)
Glucose-Capillary: 131 mg/dL — ABNORMAL HIGH (ref 70–99)
Glucose-Capillary: 96 mg/dL (ref 70–99)
Glucose-Capillary: 106 mg/dL — ABNORMAL HIGH (ref 70–99)
Glucose-Capillary: 176 mg/dL — ABNORMAL HIGH (ref 70–99)

## 2022-05-03 LAB — BASIC METABOLIC PANEL
Anion gap: 9 (ref 5–15)
BUN: 27 mg/dL — ABNORMAL HIGH (ref 8–23)
CO2: 28 mmol/L (ref 22–32)
Calcium: 8.2 mg/dL — ABNORMAL LOW (ref 8.9–10.3)
Chloride: 92 mmol/L — ABNORMAL LOW (ref 98–111)
Creatinine, Ser: 5.26 mg/dL — ABNORMAL HIGH (ref 0.44–1.00)
GFR, Estimated: 9 mL/min — ABNORMAL LOW (ref >60)
Glucose, Bld: 137 mg/dL — ABNORMAL HIGH (ref 70–99)
Potassium: 4.6 mmol/L (ref 3.5–5.1)
Sodium: 129 mmol/L — ABNORMAL LOW (ref 135–145)

## 2022-05-03 LAB — CBC
HCT: 23.6 % — ABNORMAL LOW (ref 36.0–46.0)
Hemoglobin: 7.7 g/dL — ABNORMAL LOW (ref 12.0–15.0)
MCH: 30.7 pg (ref 26.0–34.0)
MCHC: 32.6 g/dL (ref 30.0–36.0)
MCV: 94 fL (ref 80.0–100.0)
Platelets: 142 10*3/uL — ABNORMAL LOW (ref 150–400)
RBC: 2.51 MIL/uL — ABNORMAL LOW (ref 3.87–5.11)
RDW: 13.7 % (ref 11.5–15.5)
WBC: 6.1 10*3/uL (ref 4.0–10.5)
nRBC: 0 % (ref 0.0–0.2)

## 2022-05-03 LAB — CULTURE, BLOOD (ROUTINE X 2)
Culture: NO GROWTH
Culture: NO GROWTH
Special Requests: ADEQUATE
Special Requests: ADEQUATE

## 2022-05-03 LAB — MAGNESIUM: Magnesium: 2 mg/dL (ref 1.7–2.4)

## 2022-05-03 MED ORDER — SODIUM CHLORIDE 0.9 % IV SOLN
12.5000 mg | Freq: Four times a day (QID) | INTRAVENOUS | Status: DC | PRN
  Filled 2022-05-03: qty 0.5

## 2022-05-03 MED ORDER — HEPARIN SODIUM (PORCINE) 1000 UNIT/ML IJ SOLN
INTRAMUSCULAR | Status: AC
  Administered 2022-05-03: 1000 [IU]
  Filled 2022-05-03: qty 4

## 2022-05-03 NOTE — Progress Notes (Signed)
PROGRESS NOTE    [name]  JIR:[id number] DOB: [birth year] DOA: 04/28/2022 PCP: Denton Lank, MD    Assessment & Plan:   Principal Problem:   Fever Active Problems:   End-stage renal disease on hemodialysis ([place])   HTN (hypertension)   Diabetes mellitus, type II ([place])   Chronic diastolic CHF (congestive heart failure) ([place])  Assessment and Plan: Fever: etiology unclear, possibly secondary left great toe wound. Left great toe wound cx growing enterobacter and abxs changed to cipro. Blood cxs NGTD. COVID19 & viral respiratory panel are both neg. Afebrile for > 72 hrs.   Left great toe wound: present on admission. Wound cx growing enterobacter cloacae. Continue on cipro. Continue w/ wound care   Dyspnea/Acute hypoxic respiratory failure: SaO2 dropped in 60s w/ ambulating 05/02/22 as per pt's nurse. CXR shows increased vascular congestion/interstitial edema & ongoing small pleural effusions. Continue on supplemental oxygen and wean as tolerated. Encourage incentive spirometry   Generalized weakness: PT/OT recs SNF. Needs SNF placement    ESRD: on HD TTS. Nephro recs apprec.   Hyperkalemia: WNL today   ACD: secondary to ESRD. Will transfuse if Hb < 7.0  HTN: continue on CCB. Hydralazine prn   DM2: HbA1c 6.2, well controlled. Carb modified diet    Chronic diastolic CHF: volume management w/ HD    Thrombocytopenia: labile. Will continue to monitor    Obesity: BMI 30.8. Complicates overall care & prognosis    DVT prophylaxis: heparin  Code Status:  full  Family Communication: dicussed pt's care w/ pt's son, [name], and answered his questions  Disposition Plan: likely d/c back home  Level of care: Med-Surg  Status is: Inpatient Remains inpatient appropriate because: severity of illness   Consultants:  Nephro   Procedures:   Antimicrobials:    Subjective: Pt c/o fatigue   Objective: Vitals:   05/02/22 0840 05/02/22 1637 05/02/22 2054 05/03/22 0436  BP: (!)  142/48 (!) 144/53 (!) 141/50 (!) 157/71  Pulse: 70 71 77 70  Resp: '18 18 18 16  '$ Temp: 98.8 F (37.1 C) 98.1 F (36.7 C) 99.3 F (37.4 C) 97.8 F (36.6 C)  TempSrc:      SpO2: 91% 100% 100% 100%  Weight:      Height:        Intake/Output Summary (Last 24 hours) at 05/03/2022 0746 Last data filed at 05/02/2022 1900 Gross per 24 hour  Intake 240 ml  Output --  Net 240 ml    Filed Weights   05/01/22 0805 05/01/22 1149 05/02/22 0500  Weight: 78.9 kg 76.7 kg 76.5 kg    Examination:  General exam: Appears calm & comfortable  Respiratory system: diminished breath sounds b/l Cardiovascular system: S1/S2+. No rubs or clicks  Gastrointestinal system: Abd is soft, NT, obese & normal bowel sounds  Central nervous system: Alert and awake. Moves all extremities  Extremities: left great toe wound is dressed & dressing is C/D/I Psychiatry: judgement and insight appears at baseline. Appropriate mood and affect     Data Reviewed: I have personally reviewed following labs and imaging studies  CBC: Recent Labs  Lab 04/28/22 1759 04/29/22 0602 04/30/22 0402 05/01/22 0352 05/02/22 0411 05/03/22 0612  WBC 9.6 11.6* 7.0 5.7 4.4 6.1  NEUTROABS 7.5  --   --   --   --   --   HGB 8.8* 8.0* 7.9* 7.7* 7.5* 7.7*  HCT 27.5* 24.6* 23.8* 23.1* 23.5* 23.6*  MCV 94.5 96.1 93.7 92.4 95.1 94.0  PLT 176  149* 139* 131* 136* 892*   Basic Metabolic Panel: Recent Labs  Lab 04/29/22 0602 04/30/22 0402 05/01/22 0352 05/02/22 0411 05/03/22 0612  NA 133* 134* 134* 133* 129*  K 5.0 4.7 5.3* 4.3 4.6  CL 96* 97* 95* 95* 92*  CO2 '28 28 27 29 28  '$ GLUCOSE 63* 97 86 181* 137*  BUN 46* 22 33* 18 27*  CREATININE 6.63* 4.07* 5.18* 3.64* 5.26*  CALCIUM 7.9* 8.0* 8.1* 8.2* 8.2*  MG  --  1.8 2.0 1.8 2.0   GFR: Estimated Creatinine Clearance: 10.6 mL/min (A) (by C-G formula based on SCr of 5.26 mg/dL (H)). Liver Function Tests: Recent Labs  Lab 04/28/22 1759 04/29/22 0602  AST 24 43*  ALT 17 32   ALKPHOS 91 74  BILITOT 0.6 1.0  PROT 7.6 6.1*  ALBUMIN 3.9 2.8*   No results for input(s): "LIPASE", "AMYLASE" in the last 168 hours. No results for input(s): "AMMONIA" in the last 168 hours. Coagulation Profile: Recent Labs  Lab 04/28/22 1759  INR 1.0   Cardiac Enzymes: Recent Labs  Lab 04/28/22 1759  CKTOTAL 35*   BNP (last 3 results) No results for input(s): "PROBNP" in the last 8760 hours. HbA1C: No results for input(s): "HGBA1C" in the last 72 hours. CBG: Recent Labs  Lab 05/01/22 2107 05/02/22 0831 05/02/22 1204 05/02/22 1640 05/02/22 2056  GLUCAP 132* 158* 143* 229* 160*   Lipid Profile: No results for input(s): "CHOL", "HDL", "LDLCALC", "TRIG", "CHOLHDL", "LDLDIRECT" in the last 72 hours. Thyroid Function Tests: No results for input(s): "TSH", "T4TOTAL", "FREET4", "T3FREE", "THYROIDAB" in the last 72 hours.  Anemia Panel: No results for input(s): "VITAMINB12", "FOLATE", "FERRITIN", "TIBC", "IRON", "RETICCTPCT" in the last 72 hours. Sepsis Labs: Recent Labs  Lab 04/28/22 1759 04/28/22 2108 04/29/22 0602  PROCALCITON  --   --  4.45  LATICACIDVEN 1.6 1.4  --     Recent Results (from the past 240 hour(s))  Culture, blood (Routine x 2)     Status: None (Preliminary result)   Collection Time: 04/28/22  5:59 PM   Specimen: BLOOD  Result Value Ref Range Status   Specimen Description BLOOD BLOOD RIGHT FOREARM  Final   Special Requests   Final    BOTTLES DRAWN AEROBIC AND ANAEROBIC Blood Culture adequate volume   Culture   Final    NO GROWTH 4 DAYS Performed at [facility], [address], [place], [place] [address]    Report Status PENDING  Incomplete  Culture, blood (Routine x 2)     Status: None (Preliminary result)   Collection Time: 04/28/22  6:04 PM   Specimen: BLOOD  Result Value Ref Range Status   Specimen Description BLOOD BLOOD RIGHT FOREARM  Final   Special Requests   Final    BOTTLES DRAWN AEROBIC AND ANAEROBIC Blood Culture  adequate volume   Culture   Final    NO GROWTH 4 DAYS Performed at [facility], [address] [place], [place] [address]    Report Status PENDING  Incomplete  SARS Coronavirus 2 by RT PCR (hospital order, performed in [place] hospital lab) *cepheid single result test* Anterior Nasal Swab     Status: None   Collection Time: 04/28/22  8:09 PM   Specimen: Anterior Nasal Swab  Result Value Ref Range Status   SARS Coronavirus 2 by RT PCR NEGATIVE NEGATIVE Final    Comment: (NOTE) SARS-CoV-2 target nucleic acids are NOT DETECTED.  The SARS-CoV-2 RNA is generally detectable in  upper and lower respiratory specimens during the acute phase of infection. The lowest concentration of SARS-CoV-2 viral copies this assay can detect is 250 copies / mL. A negative result does not preclude SARS-CoV-2 infection and should not be used as the sole basis for treatment or other patient management decisions.  A negative result may occur with improper specimen collection / handling, submission of specimen other than nasopharyngeal swab, presence of viral mutation(s) within the areas targeted by this assay, and inadequate number of viral copies (<250 copies / mL). A negative result must be combined with clinical observations, patient history, and epidemiological information.  Fact Sheet for Patients:   https://www.patel.info/  Fact Sheet for Healthcare Providers: https://hall.com/  This test is not yet approved or  cleared by the [place] FDA and has been authorized for detection and/or diagnosis of SARS-CoV-2 by FDA under an Emergency Use Authorization (EUA).  This EUA will remain in effect (meaning this test can be used) for the duration of the COVID-19 declaration under Section 564(b)(1) of the Act, 21 U.S.C. section 360bbb-3(b)(1), unless the authorization is terminated or revoked sooner.  Performed at [facility], [place], [place] [address]   Respiratory (~20 pathogens) panel by PCR     Status: None   Collection Time: 04/29/22  1:15 PM   Specimen: Nasopharyngeal Swab; Respiratory  Result Value Ref Range Status   Adenovirus NOT DETECTED NOT DETECTED Final   Coronavirus 229E NOT DETECTED NOT DETECTED Final    Comment: (NOTE) The Coronavirus on the Respiratory Panel, DOES NOT test for the novel  Coronavirus (2019 nCoV)    Coronavirus HKU1 NOT DETECTED NOT DETECTED Final   Coronavirus NL63 NOT DETECTED NOT DETECTED Final   Coronavirus OC43 NOT DETECTED NOT DETECTED Final   Metapneumovirus NOT DETECTED NOT DETECTED Final   Rhinovirus / Enterovirus NOT DETECTED NOT DETECTED Final   Influenza A NOT DETECTED NOT DETECTED Final   Influenza B NOT DETECTED NOT DETECTED Final   Parainfluenza Virus 1 NOT DETECTED NOT DETECTED Final   Parainfluenza Virus 2 NOT DETECTED NOT DETECTED Final   Parainfluenza Virus 3 NOT DETECTED NOT DETECTED Final   Parainfluenza Virus 4 NOT DETECTED NOT DETECTED Final   Respiratory Syncytial Virus NOT DETECTED NOT DETECTED Final   Bordetella pertussis NOT DETECTED NOT DETECTED Final   Bordetella Parapertussis NOT DETECTED NOT DETECTED Final   Chlamydophila pneumoniae NOT DETECTED NOT DETECTED Final   Mycoplasma pneumoniae NOT DETECTED NOT DETECTED Final    Comment: Performed at [facility], [place]. [address] [place], [place] [address]  Culture, blood (Routine X 2) w Reflex to ID Panel     Status: None (Preliminary result)   Collection Time: 04/30/22  1:20 AM   Specimen: BLOOD  Result Value Ref Range Status   Specimen Description BLOOD PORTA CATH  Final   Special Requests   Final    BOTTLES DRAWN AEROBIC AND ANAEROBIC Blood Culture adequate volume   Culture   Final    NO GROWTH 2 DAYS Performed at [facility], [address], [place], [place] [address]    Report Status PENDING  Incomplete  Aerobic/Anaerobic Culture w Gram Stain (surgical/deep  wound)     Status: None (Preliminary result)   Collection Time: 04/30/22 12:42 PM   Specimen: Wound  Result Value Ref Range Status   Specimen Description WOUND  Final   Special Requests LEFT FOOT  Final   Gram Stain   Final  NO WBC SEEN RARE GRAM POSITIVE COCCI RARE GRAM POSITIVE RODS Performed at [facility], [place] [address], [place], [place] [address]    Culture   Final    FEW ENTEROBACTER CLOACAE NO ANAEROBES ISOLATED; CULTURE IN PROGRESS FOR 5 DAYS    Report Status PENDING  Incomplete   Organism ID, Bacteria ENTEROBACTER CLOACAE  Final      Susceptibility   Enterobacter cloacae - MIC*    CEFAZOLIN >=64 RESISTANT Resistant     CEFEPIME <=0.12 SENSITIVE Sensitive     CEFTAZIDIME <=1 SENSITIVE Sensitive     CIPROFLOXACIN <=0.25 SENSITIVE Sensitive     GENTAMICIN <=1 SENSITIVE Sensitive     IMIPENEM <=0.25 SENSITIVE Sensitive     TRIMETH/SULFA <=20 SENSITIVE Sensitive     PIP/TAZO <=4 SENSITIVE Sensitive     * FEW ENTEROBACTER CLOACAE         Radiology Studies: DG Chest Port 1 View  Result Date: 05/02/2022 CLINICAL DATA:  61 year old female with fever. Left great toe wound. End stage renal disease on dialysis. EXAM: PORTABLE CHEST 1 VIEW COMPARISON:  CT Chest, Abdomen, and Pelvis today are reported separately. 04/28/2022 and earlier. FINDINGS: Portable AP view at 0906 hours. Stable right chest dual lumen dialysis catheter. Mildly lower lung volumes since 04/28/2022. Mildly increased interstitial markings diffusely. Evidence of ongoing small bilateral pleural effusions. Stable cardiac size and mediastinal contours. Stable left subclavian and axillary region vascular stent. No pneumothorax. No consolidation. No acute osseous abnormality identified. Negative visible bowel gas. IMPRESSION: Mildly lower lung volumes since 04/28/2022 with increased vascular congestion/interstitial edema and ongoing small pleural effusions. Electronically Signed   By: Genevie Ann M.D.   On:  05/02/2022 09:17        Scheduled Meds:  amLODipine  10 mg Oral Daily   aspirin EC  81 mg Oral Daily   atorvastatin  20 mg Oral QHS   Chlorhexidine Gluconate Cloth  6 each Topical Q0600   cholecalciferol  1,000 Units Oral Daily   ciprofloxacin  500 mg Oral Q24H   vitamin B-12  1,000 mcg Oral Daily   feeding supplement (NEPRO CARB STEADY)  237 mL Oral BID BM   gabapentin  100 mg Oral TID   heparin  5,000 Units Subcutaneous Q12H   insulin aspart  0-6 Units Subcutaneous TID WC   lipase/protease/amylase  36,000 Units Oral With snacks   lipase/protease/amylase  72,000 Units Oral BID WC   mirtazapine  7.5 mg Oral QHS   multivitamin  1 tablet Oral Daily   pantoprazole  40 mg Oral Daily   polyethylene glycol  34 g Oral BID   saccharomyces boulardii  250 mg Oral Daily   sevelamer carbonate  800 mg Oral TID WC   sodium chloride flush  3 mL Intravenous Q12H   Continuous Infusions:     LOS: 5 days    Time spent: 25 mins    Wyvonnia Dusky, MD [facility] Pager [phone]  If 7PM-7AM, please contact night-coverage [email or web address] 05/03/2022, 7:46 AM

## 2022-05-03 NOTE — Progress Notes (Addendum)
Received patient in bed to unit.  Alert and oriented.  Informed consent signed and in  chart.   Treatment initiated: Ray Treatment completed: 1403  Patient tolerated well.  Transported back to the room  alert, without acute distress.  Hand-off given to patient's nurse.   Access used:catheter Access issues: none  Total UF removed: 1500 Medication(s) given: none Post HD VS: 159/55 (81), 98-temp, HR- 55, RR-19, SP02-100 Post HD weight: 76.3kg   [facility]

## 2022-05-03 NOTE — Progress Notes (Signed)
[facility]  Dialysis Note   Subjective:   Seen and examined on hemodialysis treatment. Tolerating treatment.  UF goal will be achieved.    HEMODIALYSIS FLOWSHEET:  Blood Flow Rate (mL/min): 400 mL/min Arterial Pressure (mmHg): -140 mmHg Venous Pressure (mmHg): 180 mmHg TMP (mmHg): 8 mmHg Ultrafiltration Rate (mL/min): 708 mL/min Dialysate Flow Rate (mL/min): 300 ml/min Dialysis Fluid Bolus: Normal Saline Bolus Amount (mL): 300 mL    Objective:  Vital signs in last 24 hours:  Temp:  [97.8 F (36.6 C)-99.3 F (37.4 C)] 97.8 F (36.6 C) (08/12 1042) Pulse Rate:  [65-77] 65 (08/12 1130) Resp:  [9-18] 9 (08/12 1130) BP: (141-167)/(50-86) 161/63 (08/12 1130) SpO2:  [98 %-100 %] 100 % (08/12 1130)  Weight change:  Filed Weights   05/01/22 0805 05/01/22 1149 05/02/22 0500  Weight: 78.9 kg 76.7 kg 76.5 kg    Intake/Output: I/O last 3 completed shifts: In: 240 [P.O.:240] Out: -    Intake/Output this shift:  Total I/O In: 120 [P.O.:120] Out: -   Physical Exam: General: NAD,   Head: Normocephalic, atraumatic. Moist oral mucosal membranes  Eyes: Anicteric, PERRL  Neck: Supple, trachea midline  Lungs:  Clear to auscultation  Heart: Regular rate and rhythm  Abdomen:  Soft, nontender,   Extremities: Trace peripheral edema.  Neurologic: Nonfocal, moving all four extremities  Skin: No lesions  Access:     Basic Metabolic Panel: Recent Labs  Lab 04/29/22 0602 04/30/22 0402 05/01/22 0352 05/02/22 0411 05/03/22 0612  NA 133* 134* 134* 133* 129*  K 5.0 4.7 5.3* 4.3 4.6  CL 96* 97* 95* 95* 92*  CO2 '28 28 27 29 28  '$ GLUCOSE 63* 97 86 181* 137*  BUN 46* 22 33* 18 27*  CREATININE 6.63* 4.07* 5.18* 3.64* 5.26*  CALCIUM 7.9* 8.0* 8.1* 8.2* 8.2*  MG  --  1.8 2.0 1.8 2.0    Liver Function Tests: Recent Labs  Lab 04/28/22 1759 04/29/22 0602  AST 24 43*  ALT 17 32  ALKPHOS 91 74  BILITOT 0.6 1.0  PROT 7.6 6.1*  ALBUMIN 3.9 2.8*   No results  for input(s): "LIPASE", "AMYLASE" in the last 168 hours. No results for input(s): "AMMONIA" in the last 168 hours.  CBC: Recent Labs  Lab 04/28/22 1759 04/29/22 0602 04/30/22 0402 05/01/22 0352 05/02/22 0411 05/03/22 0612  WBC 9.6 11.6* 7.0 5.7 4.4 6.1  NEUTROABS 7.5  --   --   --   --   --   HGB 8.8* 8.0* 7.9* 7.7* 7.5* 7.7*  HCT 27.5* 24.6* 23.8* 23.1* 23.5* 23.6*  MCV 94.5 96.1 93.7 92.4 95.1 94.0  PLT 176 149* 139* 131* 136* 142*    Cardiac Enzymes: Recent Labs  Lab 04/28/22 1759  CKTOTAL 35*    BNP: Invalid input(s): "POCBNP"  CBG: Recent Labs  Lab 05/02/22 1204 05/02/22 1640 05/02/22 2056 05/03/22 0757 05/03/22 0844  GLUCAP 143* 229* 160* 132* 131*    Microbiology: Results for orders placed or performed during the hospital encounter of 04/28/22  Culture, blood (Routine x 2)     Status: None   Collection Time: 04/28/22  5:59 PM   Specimen: BLOOD  Result Value Ref Range Status   Specimen Description BLOOD BLOOD RIGHT FOREARM  Final   Special Requests   Final    BOTTLES DRAWN AEROBIC AND ANAEROBIC Blood Culture adequate volume   Culture   Final    NO GROWTH 5 DAYS Performed at [facility], [address] [place], [place] [address]    Report Status 05/03/2022 FINAL  Final  Culture, blood (Routine x 2)     Status: None   Collection Time: 04/28/22  6:04 PM   Specimen: BLOOD  Result Value Ref Range Status   Specimen Description BLOOD BLOOD RIGHT FOREARM  Final   Special Requests   Final    BOTTLES DRAWN AEROBIC AND ANAEROBIC Blood Culture adequate volume   Culture   Final    NO GROWTH 5 DAYS Performed at [facility], [address], [place], [place] [address]    Report Status 05/03/2022 FINAL  Final  SARS Coronavirus 2 by RT PCR (hospital order, performed in [facility] hospital lab) *cepheid single result test* Anterior Nasal Swab     Status: None   Collection Time: 04/28/22  8:09 PM   Specimen: Anterior Nasal Swab  Result  Value Ref Range Status   SARS Coronavirus 2 by RT PCR NEGATIVE NEGATIVE Final    Comment: (NOTE) SARS-CoV-2 target nucleic acids are NOT DETECTED.  The SARS-CoV-2 RNA is generally detectable in upper and lower respiratory specimens during the acute phase of infection. The lowest concentration of SARS-CoV-2 viral copies this assay can detect is 250 copies / mL. A negative result does not preclude SARS-CoV-2 infection and should not be used as the sole basis for treatment or other patient management decisions.  A negative result may occur with improper specimen collection / handling, submission of specimen other than nasopharyngeal swab, presence of viral mutation(s) within the areas targeted by this assay, and inadequate number of viral copies (<250 copies / mL). A negative result must be combined with clinical observations, patient history, and epidemiological information.  Fact Sheet for Patients:   https://www.patel.info/  Fact Sheet for Healthcare Providers: https://hall.com/  This test is not yet approved or  cleared by the [place] FDA and has been authorized for detection and/or diagnosis of SARS-CoV-2 by FDA under an Emergency Use Authorization (EUA).  This EUA will remain in effect (meaning this test can be used) for the duration of the COVID-19 declaration under Section 564(b)(1) of the Act, 21 U.S.C. section 360bbb-3(b)(1), unless the authorization is terminated or revoked sooner.  Performed at [facility], [place], [place] [address]   Respiratory (~20 pathogens) panel by PCR     Status: None   Collection Time: 04/29/22  1:15 PM   Specimen: Nasopharyngeal Swab; Respiratory  Result Value Ref Range Status   Adenovirus NOT DETECTED NOT DETECTED Final   Coronavirus 229E NOT DETECTED NOT DETECTED Final    Comment: (NOTE) The Coronavirus on the Respiratory Panel, DOES NOT test for the novel   Coronavirus (2019 nCoV)    Coronavirus HKU1 NOT DETECTED NOT DETECTED Final   Coronavirus NL63 NOT DETECTED NOT DETECTED Final   Coronavirus OC43 NOT DETECTED NOT DETECTED Final   Metapneumovirus NOT DETECTED NOT DETECTED Final   Rhinovirus / Enterovirus NOT DETECTED NOT DETECTED Final   Influenza A NOT DETECTED NOT DETECTED Final   Influenza B NOT DETECTED NOT DETECTED Final   Parainfluenza Virus 1 NOT DETECTED NOT DETECTED Final   Parainfluenza Virus 2 NOT DETECTED NOT DETECTED Final   Parainfluenza Virus 3 NOT DETECTED NOT DETECTED Final   Parainfluenza Virus 4 NOT DETECTED NOT DETECTED Final   Respiratory Syncytial Virus NOT DETECTED NOT DETECTED Final   Bordetella pertussis NOT DETECTED NOT DETECTED Final   Bordetella Parapertussis NOT DETECTED NOT DETECTED Final   Chlamydophila pneumoniae NOT  DETECTED NOT DETECTED Final   Mycoplasma pneumoniae NOT DETECTED NOT DETECTED Final    Comment: Performed at [facility], [place] [address], [place], [place] [address]  Culture, blood (Routine X 2) w Reflex to ID Panel     Status: None (Preliminary result)   Collection Time: 04/30/22  1:20 AM   Specimen: BLOOD  Result Value Ref Range Status   Specimen Description BLOOD PORTA CATH  Final   Special Requests   Final    BOTTLES DRAWN AEROBIC AND ANAEROBIC Blood Culture adequate volume   Culture   Final    NO GROWTH 3 DAYS Performed at [facility], [address], [place], [place] [address]    Report Status PENDING  Incomplete  Aerobic/Anaerobic Culture w Gram Stain (surgical/deep wound)     Status: None (Preliminary result)   Collection Time: 04/30/22 12:42 PM   Specimen: Wound  Result Value Ref Range Status   Specimen Description WOUND  Final   Special Requests LEFT FOOT  Final   Gram Stain   Final    NO WBC SEEN RARE GRAM POSITIVE COCCI RARE GRAM POSITIVE RODS Performed at [facility], [place] [address], [place], [place] [address]    Culture   Final    FEW  ENTEROBACTER CLOACAE CULTURE REINCUBATED FOR BETTER GROWTH NO ANAEROBES ISOLATED; CULTURE IN PROGRESS FOR 5 DAYS    Report Status PENDING  Incomplete   Organism ID, Bacteria ENTEROBACTER CLOACAE  Final      Susceptibility   Enterobacter cloacae - MIC*    CEFAZOLIN >=64 RESISTANT Resistant     CEFEPIME <=0.12 SENSITIVE Sensitive     CEFTAZIDIME <=1 SENSITIVE Sensitive     CIPROFLOXACIN <=0.25 SENSITIVE Sensitive     GENTAMICIN <=1 SENSITIVE Sensitive     IMIPENEM <=0.25 SENSITIVE Sensitive     TRIMETH/SULFA <=20 SENSITIVE Sensitive     PIP/TAZO <=4 SENSITIVE Sensitive     * FEW ENTEROBACTER CLOACAE    Coagulation Studies: No results for input(s): "LABPROT", "INR" in the last 72 hours.  Urinalysis: No results for input(s): "COLORURINE", "LABSPEC", "PHURINE", "GLUCOSEU", "HGBUR", "BILIRUBINUR", "KETONESUR", "PROTEINUR", "UROBILINOGEN", "NITRITE", "LEUKOCYTESUR" in the last 72 hours.  Invalid input(s): "APPERANCEUR"    Imaging: DG Chest Port 1 View  Result Date: 05/02/2022 CLINICAL DATA:  61 year old female with fever. Left great toe wound. End stage renal disease on dialysis. EXAM: PORTABLE CHEST 1 VIEW COMPARISON:  CT Chest, Abdomen, and Pelvis today are reported separately. 04/28/2022 and earlier. FINDINGS: Portable AP view at 0906 hours. Stable right chest dual lumen dialysis catheter. Mildly lower lung volumes since 04/28/2022. Mildly increased interstitial markings diffusely. Evidence of ongoing small bilateral pleural effusions. Stable cardiac size and mediastinal contours. Stable left subclavian and axillary region vascular stent. No pneumothorax. No consolidation. No acute osseous abnormality identified. Negative visible bowel gas. IMPRESSION: Mildly lower lung volumes since 04/28/2022 with increased vascular congestion/interstitial edema and ongoing small pleural effusions. Electronically Signed   By: Genevie Ann M.D.   On: 05/02/2022 09:17     Medications:    promethazine  (PHENERGAN) injection (IM or IVPB)      amLODipine  10 mg Oral Daily   aspirin EC  81 mg Oral Daily   atorvastatin  20 mg Oral QHS   Chlorhexidine Gluconate Cloth  6 each Topical Q0600   cholecalciferol  1,000 Units Oral Daily   ciprofloxacin  500 mg Oral Q24H   vitamin B-12  1,000 mcg Oral Daily   feeding  supplement (NEPRO CARB STEADY)  237 mL Oral BID BM   gabapentin  100 mg Oral TID   heparin  5,000 Units Subcutaneous Q12H   insulin aspart  0-6 Units Subcutaneous TID WC   lipase/protease/amylase  36,000 Units Oral With snacks   lipase/protease/amylase  72,000 Units Oral BID WC   mirtazapine  7.5 mg Oral QHS   multivitamin  1 tablet Oral Daily   pantoprazole  40 mg Oral Daily   polyethylene glycol  34 g Oral BID   saccharomyces boulardii  250 mg Oral Daily   sevelamer carbonate  800 mg Oral TID WC   sodium chloride flush  3 mL Intravenous Q12H   acetaminophen **OR** acetaminophen, albuterol, calamine, hydrALAZINE, HYDROcodone-acetaminophen, nitroGLYCERIN, promethazine (PHENERGAN) injection (IM or IVPB), sevelamer carbonate **AND** sevelamer carbonate  Assessment/ Plan:  [name] is a 61 y.o.  female   Practice schedule Dialysis Center Access weight   End Stage Renal Disease on hemodialysis:  Continue stable dialysis via permacath. Potassium [facility] vascular lab)  Date Value Ref Range Status  11/05/2021 3.9 3.5 - 5.1 Final    Comment:    Performed at [facility], [address] [place], [place] [address]    Intake/Output Summary (Last 24 hours) at 05/03/2022 1156 Last data filed at 05/03/2022 0859 Gross per 24 hour  Intake 360 ml  Output --  Net 360 ml    2. Hypertension with chronic kidney disease: Blood pressure is stable. BP (!) 161/63   Pulse 65   Temp 97.8 F (36.6 C) (Oral)   Resp (!) 9   Ht '5\' 2"'$  (1.575 m)   Wt 76.5 kg   SpO2 100%   BMI 30.85 kg/m   3. Anemia of chronic kidney disease/ kidney injury/chronic disease/acute blood loss:  hemoglobin is 7.7. Normocytic/macrocytic/microcytic.  We will continue anemia protocol Lab Results  Component Value Date   HGB 7.7 (L) 05/03/2022    4. Secondary Hyperparathyroidism: with outpatient labs: Phosphate binders: We will continue sevelamer. Calcimimetics:  Vitamin D agent:  Lab Results  Component Value Date   PTH 357 (H) 10/05/2018   CALCIUM 8.2 (L) 05/03/2022   CAION 1.09 (L) 04/13/2020   PHOS 4.9 (H) 12/03/2021   #5: Fever of unknown etiology: Blood cultures are negative there is no evidence of fever at this time.    Other medications reviewed and appropriate. We will continue to monitor.   LOS: [place] 8/12/202311:56 AM

## 2022-05-03 NOTE — Plan of Care (Signed)

## 2022-05-03 NOTE — Progress Notes (Signed)
Patient transported to dialysis via transport team in bed in stable condition.

## 2022-05-04 DIAGNOSIS — N186 End stage renal disease: Secondary | ICD-10-CM | POA: Diagnosis not present

## 2022-05-04 DIAGNOSIS — Z992 Dependence on renal dialysis: Secondary | ICD-10-CM | POA: Diagnosis not present

## 2022-05-04 DIAGNOSIS — S91109A Unspecified open wound of unspecified toe(s) without damage to nail, initial encounter: Secondary | ICD-10-CM | POA: Diagnosis not present

## 2022-05-04 DIAGNOSIS — R531 Weakness: Secondary | ICD-10-CM | POA: Diagnosis not present

## 2022-05-04 LAB — GLUCOSE, CAPILLARY
Glucose-Capillary: 172 mg/dL — ABNORMAL HIGH (ref 70–99)
Glucose-Capillary: 165 mg/dL — ABNORMAL HIGH (ref 70–99)
Glucose-Capillary: 189 mg/dL — ABNORMAL HIGH (ref 70–99)
Glucose-Capillary: 157 mg/dL — ABNORMAL HIGH (ref 70–99)

## 2022-05-04 LAB — CBC
HCT: 22.6 % — ABNORMAL LOW (ref 36.0–46.0)
Hemoglobin: 7.4 g/dL — ABNORMAL LOW (ref 12.0–15.0)
MCH: 31 pg (ref 26.0–34.0)
MCHC: 32.7 g/dL (ref 30.0–36.0)
MCV: 94.6 fL (ref 80.0–100.0)
Platelets: 144 10*3/uL — ABNORMAL LOW (ref 150–400)
RBC: 2.39 MIL/uL — ABNORMAL LOW (ref 3.87–5.11)
RDW: 13.8 % (ref 11.5–15.5)
WBC: 4.8 10*3/uL (ref 4.0–10.5)
nRBC: 0 % (ref 0.0–0.2)

## 2022-05-04 LAB — BASIC METABOLIC PANEL
Anion gap: 9 (ref 5–15)
BUN: 19 mg/dL (ref 8–23)
CO2: 28 mmol/L (ref 22–32)
Calcium: 8.2 mg/dL — ABNORMAL LOW (ref 8.9–10.3)
Chloride: 96 mmol/L — ABNORMAL LOW (ref 98–111)
Creatinine, Ser: 3.5 mg/dL — ABNORMAL HIGH (ref 0.44–1.00)
GFR, Estimated: 14 mL/min — ABNORMAL LOW (ref >60)
Glucose, Bld: 181 mg/dL — ABNORMAL HIGH (ref 70–99)
Potassium: 4.1 mmol/L (ref 3.5–5.1)
Sodium: 133 mmol/L — ABNORMAL LOW (ref 135–145)

## 2022-05-04 LAB — MAGNESIUM: Magnesium: 1.7 mg/dL (ref 1.7–2.4)

## 2022-05-04 MED ORDER — PIPERACILLIN-TAZOBACTAM IN DEX 2-0.25 GM/50ML IV SOLN
2.2500 g | Freq: Three times a day (TID) | INTRAVENOUS | Status: DC
  Administered 2022-05-04 – 2022-05-05 (×3): 2.25 g via INTRAVENOUS
  Filled 2022-05-04 (×5): qty 50

## 2022-05-04 NOTE — Plan of Care (Signed)
  Problem: Education: Goal: Knowledge of General Education information will improve Description Including pain rating scale, medication(s)/side effects and non-pharmacologic comfort measures Outcome: Progressing   Problem: Health Behavior/Discharge Planning: Goal: Ability to manage health-related needs will improve Outcome: Progressing   

## 2022-05-04 NOTE — Progress Notes (Signed)
[facility]  PROGRESS NOTE   Subjective:   Sitting out of bed to chair.  Patient has stable dialysis last night with 1 and half liters fluid removal.  Breathing much better.  Objective:  Vital signs: Blood pressure (!) 150/83, pulse 74, temperature 98.1 F (36.7 C), temperature source Oral, resp. rate 18, height '5\' 2"'$  (1.575 m), weight 76.3 kg, SpO2 99 %.  Intake/Output Summary (Last 24 hours) at 05/04/2022 2015 Last data filed at 05/04/2022 1700 Gross per 24 hour  Intake 23.43 ml  Output 0 ml  Net 23.43 ml   Filed Weights   05/01/22 1149 05/02/22 0500 05/03/22 1422  Weight: 76.7 kg 76.5 kg 76.3 kg     Physical Exam: General:  No acute distress  Head:  Normocephalic, atraumatic. Moist oral mucosal membranes  Eyes:  Anicteric  Neck:  Supple  Lungs:   Clear to auscultation, normal effort  Heart:  S1S2 no rubs  Abdomen:   Soft, nontender, bowel sounds present  Extremities:  peripheral edema.  Neurologic:  Awake, alert, following commands  Skin:  No lesions  Access:     Basic Metabolic Panel: Recent Labs  Lab 04/30/22 0402 05/01/22 0352 05/02/22 0411 05/03/22 0612 05/04/22 0436  NA 134* 134* 133* 129* 133*  K 4.7 5.3* 4.3 4.6 4.1  CL 97* 95* 95* 92* 96*  CO2 '28 27 29 28 28  '$ GLUCOSE 97 86 181* 137* 181*  BUN 22 33* 18 27* 19  CREATININE 4.07* 5.18* 3.64* 5.26* 3.50*  CALCIUM 8.0* 8.1* 8.2* 8.2* 8.2*  MG 1.8 2.0 1.8 2.0 1.7    CBC: Recent Labs  Lab 04/28/22 1759 04/29/22 0602 04/30/22 0402 05/01/22 0352 05/02/22 0411 05/03/22 0612 05/04/22 0436  WBC 9.6   < > 7.0 5.7 4.4 6.1 4.8  NEUTROABS 7.5  --   --   --   --   --   --   HGB 8.8*   < > 7.9* 7.7* 7.5* 7.7* 7.4*  HCT 27.5*   < > 23.8* 23.1* 23.5* 23.6* 22.6*  MCV 94.5   < > 93.7 92.4 95.1 94.0 94.6  PLT 176   < > 139* 131* 136* 142* 144*   < > = values in this interval not displayed.     Urinalysis: No results for input(s): "COLORURINE", "LABSPEC", "PHURINE", "GLUCOSEU", "HGBUR",  "BILIRUBINUR", "KETONESUR", "PROTEINUR", "UROBILINOGEN", "NITRITE", "LEUKOCYTESUR" in the last 72 hours.  Invalid input(s): "APPERANCEUR"    Imaging: No results found.   Medications:    piperacillin-tazobactam (ZOSYN)  IV 2.25 g (05/04/22 1641)   promethazine (PHENERGAN) injection (IM or IVPB)      amLODipine  10 mg Oral Daily   aspirin EC  81 mg Oral Daily   atorvastatin  20 mg Oral QHS   Chlorhexidine Gluconate Cloth  6 each Topical Q0600   cholecalciferol  1,000 Units Oral Daily   vitamin B-12  1,000 mcg Oral Daily   feeding supplement (NEPRO CARB STEADY)  237 mL Oral BID BM   gabapentin  100 mg Oral TID   heparin  5,000 Units Subcutaneous Q12H   insulin aspart  0-6 Units Subcutaneous TID WC   lipase/protease/amylase  36,000 Units Oral With snacks   lipase/protease/amylase  72,000 Units Oral BID WC   mirtazapine  7.5 mg Oral QHS   multivitamin  1 tablet Oral Daily   pantoprazole  40 mg Oral Daily   polyethylene glycol  34 g Oral BID   saccharomyces boulardii  250 mg Oral  Daily   sevelamer carbonate  800 mg Oral TID WC   sodium chloride flush  3 mL Intravenous Q12H    Assessment/ Plan:     Principal Problem:   Fever Active Problems:   End-stage renal disease on hemodialysis (HCC)   Diabetes mellitus, type II (HCC)   HTN (hypertension)   Chronic diastolic CHF (congestive heart failure) ([place])  61 y.o., female with past medical history of anemia, asthma, diabetes, CAD, PAD, GERD, and ESRD dialysis. Patient present to ED with fever which may be secondary to sepsis from the nonhealing toe wounds.  #1: ESRD: Patient has stable dialysis last night.  She is on a TTS schedule.  #2: Secondary hyperparathyroidism: Continue sevelamer at the present doses.  #3: Sepsis: Continue Zosyn as ordered.  #4: Anemia: Continue anemia protocol and transfuse if hemoglobin is less than 7.   LOS: [place], [facility] 8/13/20238:15 PM

## 2022-05-04 NOTE — Progress Notes (Addendum)
PROGRESS NOTE    [name]  XFG:[id number] DOB: July 12, 1961 DOA: 04/28/2022 PCP: Denton Lank, MD    Assessment & Plan:   Principal Problem:   Fever Active Problems:   End-stage renal disease on hemodialysis ([place])   HTN (hypertension)   Diabetes mellitus, type II ([place])   Chronic diastolic CHF (congestive heart failure) ([place])  Assessment and Plan: Fever: etiology unclear, possibly secondary left great toe wound. Left great toe wound cx growing enterobacter  & enterococcus and abxs changed to IV zosyn. Blood cxs NGTD. COVID19 & viral respiratory panel are both neg. Afebrile > 72 hrs   Left great toe wound: present on admission. Wound cx growing enterobacter cloacae & enterococcus faecalis. Changed abxs to IV zosyn. Continue w/ wound care  Dyspnea/Acute hypoxic respiratory failure: SaO2 dropped in 60s w/ ambulating 05/02/22 as per pt's nurse. CXR shows increased vascular congestion/interstitial edema & ongoing small pleural effusions. Encourage incentive spirometry. Continue on supplemental oxygen and wean as tolerated   Generalized weakness: PT/OT recs SNF. Waiting on SNF placement    ESRD: on HD TTS. Nephro recs apprec   on HD TTS. Nephro recs apprec.   Hyperkalemia: WNL today    ACD: secondary to ESRD. Will transfuse if Hb < 7.0  HTN: continue on amlodipine. Hydralazine prn   DM2: well controlled, HbA1c 6.2. Carb modified diet   Chronic diastolic CHF: volume management w/ HD    Thrombocytopenia: labile   Obesity: BMI 30.8. Complicates overall care & prognosis    DVT prophylaxis: heparin  Code Status:  full  Family Communication:  Disposition Plan: likely d/c back home  Level of care: Med-Surg  Status is: Inpatient Remains inpatient appropriate because: severity of illness   Consultants:  Nephro   Procedures:   Antimicrobials: zosyn   Subjective: Pt c/o malaise   Objective: Vitals:   05/03/22 1403 05/03/22 1422 05/03/22 2146 05/04/22 0515  BP: (!)  159/55  (!) 134/59 (!) 157/65  Pulse: (!) 55  70 70  Resp: '19  18 16  '$ Temp: 98 F (36.7 C)  97.9 F (36.6 C) 98.2 F (36.8 C)  TempSrc: Oral     SpO2: 100%  94% 100%  Weight:  76.3 kg    Height:        Intake/Output Summary (Last 24 hours) at 05/04/2022 0741 Last data filed at 05/03/2022 1550 Gross per 24 hour  Intake 243 ml  Output 1500 ml  Net -1257 ml    Filed Weights   05/01/22 1149 05/02/22 0500 05/03/22 1422  Weight: 76.7 kg 76.5 kg 76.3 kg    Examination:  General exam: Appears comfortable  Respiratory system: decreased breaths sounds b/l  Cardiovascular system: S1/S2+. No rubs or clicks  Gastrointestinal system: Abd is soft, NT, ND & hypoactive bowel sounds Central nervous system: Alert and awake. Moves all extremities  Extremities: left great toe wound is dressed & dressing is C/D/I Psychiatry: judgement and insight appears at baseline. Appropriate mood and affect     Data Reviewed: I have personally reviewed following labs and imaging studies  CBC: Recent Labs  Lab 04/28/22 1759 04/29/22 0602 04/30/22 0402 05/01/22 0352 05/02/22 0411 05/03/22 0612 05/04/22 0436  WBC 9.6   < > 7.0 5.7 4.4 6.1 4.8  NEUTROABS 7.5  --   --   --   --   --   --   HGB 8.8*   < > 7.9* 7.7* 7.5* 7.7* 7.4*  HCT 27.5*   < > 23.8* 23.1*  23.5* 23.6* 22.6*  MCV 94.5   < > 93.7 92.4 95.1 94.0 94.6  PLT 176   < > 139* 131* 136* 142* 144*   < > = values in this interval not displayed.   Basic Metabolic Panel: Recent Labs  Lab 04/30/22 0402 05/01/22 0352 05/02/22 0411 05/03/22 0612 05/04/22 0436  NA 134* 134* 133* 129* 133*  K 4.7 5.3* 4.3 4.6 4.1  CL 97* 95* 95* 92* 96*  CO2 '28 27 29 28 28  '$ GLUCOSE 97 86 181* 137* 181*  BUN 22 33* 18 27* 19  CREATININE 4.07* 5.18* 3.64* 5.26* 3.50*  CALCIUM 8.0* 8.1* 8.2* 8.2* 8.2*  MG 1.8 2.0 1.8 2.0 1.7   GFR: Estimated Creatinine Clearance: 15.9 mL/min (A) (by C-G formula based on SCr of 3.5 mg/dL (H)). Liver Function  Tests: Recent Labs  Lab 04/28/22 1759 04/29/22 0602  AST 24 43*  ALT 17 32  ALKPHOS 91 74  BILITOT 0.6 1.0  PROT 7.6 6.1*  ALBUMIN 3.9 2.8*   No results for input(s): "LIPASE", "AMYLASE" in the last 168 hours. No results for input(s): "AMMONIA" in the last 168 hours. Coagulation Profile: Recent Labs  Lab 04/28/22 1759  INR 1.0   Cardiac Enzymes: Recent Labs  Lab 04/28/22 1759  CKTOTAL 35*   BNP (last 3 results) No results for input(s): "PROBNP" in the last 8760 hours. HbA1C: No results for input(s): "HGBA1C" in the last 72 hours. CBG: Recent Labs  Lab 05/03/22 0757 05/03/22 0844 05/03/22 1527 05/03/22 1643 05/03/22 2209  GLUCAP 132* 131* 96 106* 176*   Lipid Profile: No results for input(s): "CHOL", "HDL", "LDLCALC", "TRIG", "CHOLHDL", "LDLDIRECT" in the last 72 hours. Thyroid Function Tests: No results for input(s): "TSH", "T4TOTAL", "FREET4", "T3FREE", "THYROIDAB" in the last 72 hours.  Anemia Panel: No results for input(s): "VITAMINB12", "FOLATE", "FERRITIN", "TIBC", "IRON", "RETICCTPCT" in the last 72 hours. Sepsis Labs: Recent Labs  Lab 04/28/22 1759 04/28/22 2108 04/29/22 0602  PROCALCITON  --   --  4.45  LATICACIDVEN 1.6 1.4  --     Recent Results (from the past 240 hour(s))  Culture, blood (Routine x 2)     Status: None   Collection Time: 04/28/22  5:59 PM   Specimen: BLOOD  Result Value Ref Range Status   Specimen Description BLOOD BLOOD RIGHT FOREARM  Final   Special Requests   Final    BOTTLES DRAWN AEROBIC AND ANAEROBIC Blood Culture adequate volume   Culture   Final    NO GROWTH 5 DAYS Performed at [facility], [name] [place], [place] [address]    Report Status 05/03/2022 FINAL  Final  Culture, blood (Routine x 2)     Status: None   Collection Time: 04/28/22  6:04 PM   Specimen: BLOOD  Result Value Ref Range Status   Specimen Description BLOOD BLOOD RIGHT FOREARM  Final   Special Requests   Final    BOTTLES  DRAWN AEROBIC AND ANAEROBIC Blood Culture adequate volume   Culture   Final    NO GROWTH 5 DAYS Performed at [facility], [address], [place], [place] [address]    Report Status 05/03/2022 FINAL  Final  SARS Coronavirus 2 by RT PCR (hospital order, performed in [facility] hospital lab) *cepheid single result test* Anterior Nasal Swab     Status: None   Collection Time: 04/28/22  8:09 PM   Specimen: Anterior Nasal Swab  Result Value Ref Range Status  SARS Coronavirus 2 by RT PCR NEGATIVE NEGATIVE Final    Comment: (NOTE) SARS-CoV-2 target nucleic acids are NOT DETECTED.  The SARS-CoV-2 RNA is generally detectable in upper and lower respiratory specimens during the acute phase of infection. The lowest concentration of SARS-CoV-2 viral copies this assay can detect is 250 copies / mL. A negative result does not preclude SARS-CoV-2 infection and should not be used as the sole basis for treatment or other patient management decisions.  A negative result may occur with improper specimen collection / handling, submission of specimen other than nasopharyngeal swab, presence of viral mutation(s) within the areas targeted by this assay, and inadequate number of viral copies (<250 copies / mL). A negative result must be combined with clinical observations, patient history, and epidemiological information.  Fact Sheet for Patients:   https://www.patel.info/  Fact Sheet for Healthcare Providers: https://hall.com/  This test is not yet approved or  cleared by the [place] FDA and has been authorized for detection and/or diagnosis of SARS-CoV-2 by FDA under an Emergency Use Authorization (EUA).  This EUA will remain in effect (meaning this test can be used) for the duration of the COVID-19 declaration under Section 564(b)(1) of the Act, 21 U.S.C. section 360bbb-3(b)(1), unless the authorization is terminated or revoked  sooner.  Performed at [facility], [place], [place] [address]   Respiratory (~20 pathogens) panel by PCR     Status: None   Collection Time: 04/29/22  1:15 PM   Specimen: Nasopharyngeal Swab; Respiratory  Result Value Ref Range Status   Adenovirus NOT DETECTED NOT DETECTED Final   Coronavirus 229E NOT DETECTED NOT DETECTED Final    Comment: (NOTE) The Coronavirus on the Respiratory Panel, DOES NOT test for the novel  Coronavirus (2019 nCoV)    Coronavirus HKU1 NOT DETECTED NOT DETECTED Final   Coronavirus NL63 NOT DETECTED NOT DETECTED Final   Coronavirus OC43 NOT DETECTED NOT DETECTED Final   Metapneumovirus NOT DETECTED NOT DETECTED Final   Rhinovirus / Enterovirus NOT DETECTED NOT DETECTED Final   Influenza A NOT DETECTED NOT DETECTED Final   Influenza B NOT DETECTED NOT DETECTED Final   Parainfluenza Virus 1 NOT DETECTED NOT DETECTED Final   Parainfluenza Virus 2 NOT DETECTED NOT DETECTED Final   Parainfluenza Virus 3 NOT DETECTED NOT DETECTED Final   Parainfluenza Virus 4 NOT DETECTED NOT DETECTED Final   Respiratory Syncytial Virus NOT DETECTED NOT DETECTED Final   Bordetella pertussis NOT DETECTED NOT DETECTED Final   Bordetella Parapertussis NOT DETECTED NOT DETECTED Final   Chlamydophila pneumoniae NOT DETECTED NOT DETECTED Final   Mycoplasma pneumoniae NOT DETECTED NOT DETECTED Final    Comment: Performed at [facility], [place]. [address] [place], [place] [address]  Culture, blood (Routine X 2) w Reflex to ID Panel     Status: None (Preliminary result)   Collection Time: 04/30/22  1:20 AM   Specimen: BLOOD  Result Value Ref Range Status   Specimen Description BLOOD PORTA CATH  Final   Special Requests   Final    BOTTLES DRAWN AEROBIC AND ANAEROBIC Blood Culture adequate volume   Culture   Final    NO GROWTH 4 DAYS Performed at [facility], [address], [place], [place] [address]    Report Status PENDING  Incomplete   Aerobic/Anaerobic Culture w Gram Stain (surgical/deep wound)     Status: None (Preliminary result)   Collection Time: 04/30/22 12:42 PM   Specimen: Wound  Result Value Ref Range Status   Specimen Description WOUND  Final   Special Requests LEFT FOOT  Final   Gram Stain   Final    NO WBC SEEN RARE GRAM POSITIVE COCCI RARE GRAM POSITIVE RODS Performed at [facility], [address], [place], [place] [address]    Culture   Final    FEW ENTEROBACTER CLOACAE FEW ENTEROCOCCUS FAECALIS SUSCEPTIBILITIES TO FOLLOW NO ANAEROBES ISOLATED; CULTURE IN PROGRESS FOR 5 DAYS    Report Status PENDING  Incomplete   Organism ID, Bacteria ENTEROBACTER CLOACAE  Final      Susceptibility   Enterobacter cloacae - MIC*    CEFAZOLIN >=64 RESISTANT Resistant     CEFEPIME <=0.12 SENSITIVE Sensitive     CEFTAZIDIME <=1 SENSITIVE Sensitive     CIPROFLOXACIN <=0.25 SENSITIVE Sensitive     GENTAMICIN <=1 SENSITIVE Sensitive     IMIPENEM <=0.25 SENSITIVE Sensitive     TRIMETH/SULFA <=20 SENSITIVE Sensitive     PIP/TAZO <=4 SENSITIVE Sensitive     * FEW ENTEROBACTER CLOACAE         Radiology Studies: DG Chest Port 1 View  Result Date: 05/02/2022 CLINICAL DATA:  61 year old female with fever. Left great toe wound. End stage renal disease on dialysis. EXAM: PORTABLE CHEST 1 VIEW COMPARISON:  CT Chest, Abdomen, and Pelvis today are reported separately. 04/28/2022 and earlier. FINDINGS: Portable AP view at 0906 hours. Stable right chest dual lumen dialysis catheter. Mildly lower lung volumes since 04/28/2022. Mildly increased interstitial markings diffusely. Evidence of ongoing small bilateral pleural effusions. Stable cardiac size and mediastinal contours. Stable left subclavian and axillary region vascular stent. No pneumothorax. No consolidation. No acute osseous abnormality identified. Negative visible bowel gas. IMPRESSION: Mildly lower lung volumes since 04/28/2022 with increased vascular  congestion/interstitial edema and ongoing small pleural effusions. Electronically Signed   By: Genevie Ann M.D.   On: 05/02/2022 09:17        Scheduled Meds:  amLODipine  10 mg Oral Daily   aspirin EC  81 mg Oral Daily   atorvastatin  20 mg Oral QHS   Chlorhexidine Gluconate Cloth  6 each Topical Q0600   cholecalciferol  1,000 Units Oral Daily   ciprofloxacin  500 mg Oral Q24H   vitamin B-12  1,000 mcg Oral Daily   feeding supplement (NEPRO CARB STEADY)  237 mL Oral BID BM   gabapentin  100 mg Oral TID   heparin  5,000 Units Subcutaneous Q12H   insulin aspart  0-6 Units Subcutaneous TID WC   lipase/protease/amylase  36,000 Units Oral With snacks   lipase/protease/amylase  72,000 Units Oral BID WC   mirtazapine  7.5 mg Oral QHS   multivitamin  1 tablet Oral Daily   pantoprazole  40 mg Oral Daily   polyethylene glycol  34 g Oral BID   saccharomyces boulardii  250 mg Oral Daily   sevelamer carbonate  800 mg Oral TID WC   sodium chloride flush  3 mL Intravenous Q12H   Continuous Infusions:  promethazine (PHENERGAN) injection (IM or IVPB)        LOS: 6 days    Time spent: 25 mins    Wyvonnia Dusky, MD [facility] Pager [phone]  If 7PM-7AM, please contact night-coverage [email or web address] 05/04/2022, 7:41 AM

## 2022-05-05 DIAGNOSIS — N186 End stage renal disease: Secondary | ICD-10-CM | POA: Diagnosis not present

## 2022-05-05 DIAGNOSIS — I10 Essential (primary) hypertension: Secondary | ICD-10-CM

## 2022-05-05 DIAGNOSIS — Z992 Dependence on renal dialysis: Secondary | ICD-10-CM | POA: Diagnosis not present

## 2022-05-05 DIAGNOSIS — L089 Local infection of the skin and subcutaneous tissue, unspecified: Secondary | ICD-10-CM | POA: Diagnosis not present

## 2022-05-05 LAB — GLUCOSE, CAPILLARY
Glucose-Capillary: 172 mg/dL — ABNORMAL HIGH (ref 70–99)
Glucose-Capillary: 115 mg/dL — ABNORMAL HIGH (ref 70–99)
Glucose-Capillary: 162 mg/dL — ABNORMAL HIGH (ref 70–99)

## 2022-05-05 LAB — BASIC METABOLIC PANEL
Anion gap: 11 (ref 5–15)
BUN: 29 mg/dL — ABNORMAL HIGH (ref 8–23)
CO2: 24 mmol/L (ref 22–32)
Calcium: 8.5 mg/dL — ABNORMAL LOW (ref 8.9–10.3)
Chloride: 96 mmol/L — ABNORMAL LOW (ref 98–111)
Creatinine, Ser: 4.72 mg/dL — ABNORMAL HIGH (ref 0.44–1.00)
GFR, Estimated: 10 mL/min — ABNORMAL LOW (ref >60)
Glucose, Bld: 162 mg/dL — ABNORMAL HIGH (ref 70–99)
Potassium: 4.8 mmol/L (ref 3.5–5.1)
Sodium: 131 mmol/L — ABNORMAL LOW (ref 135–145)

## 2022-05-05 LAB — AEROBIC/ANAEROBIC CULTURE W GRAM STAIN (SURGICAL/DEEP WOUND): Gram Stain: NONE SEEN

## 2022-05-05 LAB — CBC
HCT: 23.5 % — ABNORMAL LOW (ref 36.0–46.0)
Hemoglobin: 7.7 g/dL — ABNORMAL LOW (ref 12.0–15.0)
MCH: 30.4 pg (ref 26.0–34.0)
MCHC: 32.8 g/dL (ref 30.0–36.0)
MCV: 92.9 fL (ref 80.0–100.0)
Platelets: 180 10*3/uL (ref 150–400)
RBC: 2.53 MIL/uL — ABNORMAL LOW (ref 3.87–5.11)
RDW: 13.6 % (ref 11.5–15.5)
WBC: 5.2 10*3/uL (ref 4.0–10.5)
nRBC: 0 % (ref 0.0–0.2)

## 2022-05-05 LAB — CULTURE, BLOOD (ROUTINE X 2)
Culture: NO GROWTH
Special Requests: ADEQUATE

## 2022-05-05 MED ORDER — CIPROFLOXACIN HCL 500 MG PO TABS
500.0000 mg | ORAL_TABLET | Freq: Two times a day (BID) | ORAL | 0 refills | Status: AC
Start: 2022-05-05 — End: 2022-05-08

## 2022-05-05 NOTE — Progress Notes (Signed)
[facility]  ROUNDING NOTE   Subjective:   [name] is a 61 y.o., female with past medical history of anemia, asthma, diabetes, CAD, PAD, GERD, and end stage renal disease on dialysis. Patient present to ED with fever that began yesterday. She has been admitted for Fever [R50.9] Fever, unspecified fever cause [R50.9]  Patient is known to our clinic and receives outpatient dialysis at [facility] on a TTS schedule.   Update Patient states she continues to improve daily. Currently sitting up in bed, eating breakfast Afebrile No lower extremity edema Remains on room air   Objective:  Vital signs in last 24 hours:  Temp:  [97.3 F (36.3 C)-98.5 F (36.9 C)] 97.7 F (36.5 C) (08/14 0828) Pulse Rate:  [63-75] 66 (08/14 0828) Resp:  [16-18] 16 (08/14 0828) BP: (147-166)/(56-83) 166/64 (08/14 0828) SpO2:  [99 %-100 %] 100 % (08/14 0828) Weight:  [76.9 kg] 76.9 kg (08/14 0500)  Weight change: 0.605 kg Filed Weights   05/02/22 0500 05/03/22 1422 05/05/22 0500  Weight: 76.5 kg 76.3 kg 76.9 kg    Intake/Output: I/O last 3 completed shifts: In: 74.4 [IV Piggyback:74.4] Out: 0    Intake/Output this shift:  No intake/output data recorded.  Physical Exam: General: NAD  Head: Normocephalic, atraumatic. Moist oral mucosal membranes  Eyes: Anicteric  Lungs:  Clear to auscultation, normal effort   Heart: Regular rate and rhythm  Abdomen:  Soft, nontender  Extremities: No peripheral edema.  Neurologic: Nonfocal, moving all four extremities  Skin: No lesions  Access: Right chest PermCath    Basic Metabolic Panel: Recent Labs  Lab 04/30/22 0402 05/01/22 0352 05/02/22 0411 05/03/22 0612 05/04/22 0436 05/05/22 0544  NA 134* 134* 133* 129* 133* 131*  K 4.7 5.3* 4.3 4.6 4.1 4.8  CL 97* 95* 95* 92* 96* 96*  CO2 '28 27 29 28 28 24  '$ GLUCOSE 97 86 181* 137* 181* 162*  BUN 22 33* 18 27* 19 29*  CREATININE 4.07* 5.18* 3.64* 5.26* 3.50* 4.72*  CALCIUM 8.0*  8.1* 8.2* 8.2* 8.2* 8.5*  MG 1.8 2.0 1.8 2.0 1.7  --      Liver Function Tests: Recent Labs  Lab 04/28/22 1759 04/29/22 0602  AST 24 43*  ALT 17 32  ALKPHOS 91 74  BILITOT 0.6 1.0  PROT 7.6 6.1*  ALBUMIN 3.9 2.8*    No results for input(s): "LIPASE", "AMYLASE" in the last 168 hours. No results for input(s): "AMMONIA" in the last 168 hours.  CBC: Recent Labs  Lab 04/28/22 1759 04/29/22 0602 05/01/22 0352 05/02/22 0411 05/03/22 0612 05/04/22 0436 05/05/22 0544  WBC 9.6   < > 5.7 4.4 6.1 4.8 5.2  NEUTROABS 7.5  --   --   --   --   --   --   HGB 8.8*   < > 7.7* 7.5* 7.7* 7.4* 7.7*  HCT 27.5*   < > 23.1* 23.5* 23.6* 22.6* 23.5*  MCV 94.5   < > 92.4 95.1 94.0 94.6 92.9  PLT 176   < > 131* 136* 142* 144* 180   < > = values in this interval not displayed.     Cardiac Enzymes: Recent Labs  Lab 04/28/22 1759  CKTOTAL 35*     BNP: Invalid input(s): "POCBNP"  CBG: Recent Labs  Lab 05/04/22 1150 05/04/22 1633 05/04/22 2012 05/05/22 0818 05/05/22 1217  GLUCAP 165* 189* 157* 172* 115*     Microbiology: Results for orders placed or performed during the  hospital encounter of 04/28/22  Culture, blood (Routine x 2)     Status: None   Collection Time: 04/28/22  5:59 PM   Specimen: BLOOD  Result Value Ref Range Status   Specimen Description BLOOD BLOOD RIGHT FOREARM  Final   Special Requests   Final    BOTTLES DRAWN AEROBIC AND ANAEROBIC Blood Culture adequate volume   Culture   Final    NO GROWTH 5 DAYS Performed at [facility], [address], [place], [place] [address]    Report Status 05/03/2022 FINAL  Final  Culture, blood (Routine x 2)     Status: None   Collection Time: 04/28/22  6:04 PM   Specimen: BLOOD  Result Value Ref Range Status   Specimen Description BLOOD BLOOD RIGHT FOREARM  Final   Special Requests   Final    BOTTLES DRAWN AEROBIC AND ANAEROBIC Blood Culture adequate volume   Culture   Final    NO GROWTH 5 DAYS Performed  at [facility], [address] [place], [place] [address]    Report Status 05/03/2022 FINAL  Final  SARS Coronavirus 2 by RT PCR (hospital order, performed in [facility] hospital lab) *cepheid single result test* Anterior Nasal Swab     Status: None   Collection Time: 04/28/22  8:09 PM   Specimen: Anterior Nasal Swab  Result Value Ref Range Status   SARS Coronavirus 2 by RT PCR NEGATIVE NEGATIVE Final    Comment: (NOTE) SARS-CoV-2 target nucleic acids are NOT DETECTED.  The SARS-CoV-2 RNA is generally detectable in upper and lower respiratory specimens during the acute phase of infection. The lowest concentration of SARS-CoV-2 viral copies this assay can detect is 250 copies / mL. A negative result does not preclude SARS-CoV-2 infection and should not be used as the sole basis for treatment or other patient management decisions.  A negative result may occur with improper specimen collection / handling, submission of specimen other than nasopharyngeal swab, presence of viral mutation(s) within the areas targeted by this assay, and inadequate number of viral copies (<250 copies / mL). A negative result must be combined with clinical observations, patient history, and epidemiological information.  Fact Sheet for Patients:   https://www.patel.info/  Fact Sheet for Healthcare Providers: https://hall.com/  This test is not yet approved or  cleared by the [place] FDA and has been authorized for detection and/or diagnosis of SARS-CoV-2 by FDA under an Emergency Use Authorization (EUA).  This EUA will remain in effect (meaning this test can be used) for the duration of the COVID-19 declaration under Section 564(b)(1) of the Act, 21 U.S.C. section 360bbb-3(b)(1), unless the authorization is terminated or revoked sooner.  Performed at [facility], [place], [place] [address]   Respiratory (~20  pathogens) panel by PCR     Status: None   Collection Time: 04/29/22  1:15 PM   Specimen: Nasopharyngeal Swab; Respiratory  Result Value Ref Range Status   Adenovirus NOT DETECTED NOT DETECTED Final   Coronavirus 229E NOT DETECTED NOT DETECTED Final    Comment: (NOTE) The Coronavirus on the Respiratory Panel, DOES NOT test for the novel  Coronavirus (2019 nCoV)    Coronavirus HKU1 NOT DETECTED NOT DETECTED Final   Coronavirus NL63 NOT DETECTED NOT DETECTED Final   Coronavirus OC43 NOT DETECTED NOT DETECTED Final   Metapneumovirus NOT DETECTED NOT DETECTED Final   Rhinovirus / Enterovirus NOT DETECTED NOT DETECTED Final   Influenza A NOT DETECTED NOT  DETECTED Final   Influenza B NOT DETECTED NOT DETECTED Final   Parainfluenza Virus 1 NOT DETECTED NOT DETECTED Final   Parainfluenza Virus 2 NOT DETECTED NOT DETECTED Final   Parainfluenza Virus 3 NOT DETECTED NOT DETECTED Final   Parainfluenza Virus 4 NOT DETECTED NOT DETECTED Final   Respiratory Syncytial Virus NOT DETECTED NOT DETECTED Final   Bordetella pertussis NOT DETECTED NOT DETECTED Final   Bordetella Parapertussis NOT DETECTED NOT DETECTED Final   Chlamydophila pneumoniae NOT DETECTED NOT DETECTED Final   Mycoplasma pneumoniae NOT DETECTED NOT DETECTED Final    Comment: Performed at [facility], [place] [address], [place], [place] [address]  Culture, blood (Routine X 2) w Reflex to ID Panel     Status: None   Collection Time: 04/30/22  1:20 AM   Specimen: BLOOD  Result Value Ref Range Status   Specimen Description BLOOD PORTA CATH  Final   Special Requests   Final    BOTTLES DRAWN AEROBIC AND ANAEROBIC Blood Culture adequate volume   Culture   Final    NO GROWTH 5 DAYS Performed at [facility], [address] [place], [place] [address]    Report Status 05/05/2022 FINAL  Final  Aerobic/Anaerobic Culture w Gram Stain (surgical/deep wound)     Status: None (Preliminary result)   Collection Time: 04/30/22  12:42 PM   Specimen: Wound  Result Value Ref Range Status   Specimen Description WOUND  Final   Special Requests LEFT FOOT  Final   Gram Stain   Final    NO WBC SEEN RARE GRAM POSITIVE COCCI RARE GRAM POSITIVE RODS Performed at [facility], [place] [address] [place], [place] [address]    Culture   Final    FEW ENTEROBACTER CLOACAE FEW ENTEROCOCCUS FAECALIS NO ANAEROBES ISOLATED; CULTURE IN PROGRESS FOR 5 DAYS    Report Status PENDING  Incomplete   Organism ID, Bacteria ENTEROBACTER CLOACAE  Final   Organism ID, Bacteria ENTEROCOCCUS FAECALIS  Final      Susceptibility   Enterobacter cloacae - MIC*    CEFAZOLIN >=64 RESISTANT Resistant     CEFEPIME <=0.12 SENSITIVE Sensitive     CEFTAZIDIME <=1 SENSITIVE Sensitive     CIPROFLOXACIN <=0.25 SENSITIVE Sensitive     GENTAMICIN <=1 SENSITIVE Sensitive     IMIPENEM <=0.25 SENSITIVE Sensitive     TRIMETH/SULFA <=20 SENSITIVE Sensitive     PIP/TAZO <=4 SENSITIVE Sensitive     * FEW ENTEROBACTER CLOACAE   Enterococcus faecalis - MIC*    AMPICILLIN <=2 SENSITIVE Sensitive     VANCOMYCIN 1 SENSITIVE Sensitive     GENTAMICIN SYNERGY SENSITIVE Sensitive     * FEW ENTEROCOCCUS FAECALIS    Coagulation Studies: No results for input(s): "LABPROT", "INR" in the last 72 hours.   Urinalysis: No results for input(s): "COLORURINE", "LABSPEC", "PHURINE", "GLUCOSEU", "HGBUR", "BILIRUBINUR", "KETONESUR", "PROTEINUR", "UROBILINOGEN", "NITRITE", "LEUKOCYTESUR" in the last 72 hours.  Invalid input(s): "APPERANCEUR"    Imaging: No results found.   Medications:    piperacillin-tazobactam (ZOSYN)  IV 2.25 g (05/05/22 0517)   promethazine (PHENERGAN) injection (IM or IVPB)       amLODipine  10 mg Oral Daily   aspirin EC  81 mg Oral Daily   atorvastatin  20 mg Oral QHS   Chlorhexidine Gluconate Cloth  6 each Topical Q0600   cholecalciferol  1,000 Units Oral Daily   vitamin B-12  1,000 mcg Oral Daily   feeding supplement (NEPRO CARB  STEADY)  237 mL Oral BID BM   gabapentin  100 mg Oral TID   heparin  5,000 Units Subcutaneous Q12H   insulin aspart  0-6 Units Subcutaneous TID WC   lipase/protease/amylase  36,000 Units Oral With snacks   lipase/protease/amylase  72,000 Units Oral BID WC   mirtazapine  7.5 mg Oral QHS   multivitamin  1 tablet Oral Daily   pantoprazole  40 mg Oral Daily   polyethylene glycol  34 g Oral BID   saccharomyces boulardii  250 mg Oral Daily   sevelamer carbonate  800 mg Oral TID WC   sodium chloride flush  3 mL Intravenous Q12H   acetaminophen **OR** acetaminophen, albuterol, calamine, hydrALAZINE, HYDROcodone-acetaminophen, nitroGLYCERIN, promethazine (PHENERGAN) injection (IM or IVPB), sevelamer carbonate **AND** sevelamer carbonate  Assessment/ Plan:  Ms. ARMYA WESTERHOFF is a 60 y.o.  female with past medical history of anemia, asthma, diabetes, CAD, PAD, GERD, and end stage renal disease on dialysis. Patient present to ED with fever that began yesterday. She has been admitted for Fever [R50.9] Fever, unspecified fever cause [R50.9]  [facility]/TTS/Rt permcath  End stage renal disease on hemodialysis.   Next dialysis scheduled for Tuesday.  Renal navigator monitoring discharge plan to include rehab.  Will assess for any changes to outpatient needs.  2. Anemia of chronic kidney disease Lab Results  Component Value Date   HGB 7.7 (L) 05/05/2022    Mircera in outpatient clinic.   3. Secondary Hyperparathyroidism: with outpatient labs: PTH 736, phosphorus 5.3, calcium 8.6 on 04/01/22.   Lab Results  Component Value Date   PTH 357 (H) 10/05/2018   CALCIUM 8.5 (L) 05/05/2022   CAION 1.09 (L) 04/13/2020   PHOS 4.9 (H) 12/03/2021  Continue sevelamer with meals.  We will continue to monitor bone minerals during this admission  4. Diabetes mellitus type II with chronic kidney disease: noninsulin dependent. Most recent hemoglobin A1c is 6.2 on 11/15/21.   5. Fever, unknown  etiology. Afebrile since admission. Blood cultures negative.    LOS: 7 [place] 8/14/20232:10 PM

## 2022-05-05 NOTE — Progress Notes (Signed)
Physical Therapy Treatment Patient Details Name: [name] MRN: [id number] DOB: 1961-03-30 Today's Date: 05/05/2022   History of Present Illness Pt is a 61 y.o., female with past medical history of anemia, asthma, diabetes, CAD, PAD, GERD, CHF, and end stage renal disease on dialysis. Patient present to ED with fever that began yesterday. Noted for L great toe wound as well.    PT Comments    Patient alert, on phone at start of session eating lunch EOB in a reclined position. Seen by PT/OT to maximize safety/function. Pt did display a lot of improvement in mobility overall, primarily 1 assist for mobility, second assist for safety/lines leads. She was able to come up fully into sitting modI. Sit <> stand from EOB with RW and CGA, and spent most of the session in standing. She ambulated ~75f in room (limited by contact precautions/lines/leads) progressed to supervision. Able to take medication from RN in statically standing position as well without UE support. Overall due to pt progress with mobility, recommendation is HHPT with intermittent supervision/assistance at this time, RN and MD notified.      Recommendations for follow up therapy are one component of a multi-disciplinary discharge planning process, led by the attending physician.  Recommendations may be updated based on patient status, additional functional criteria and insurance authorization.  Follow Up Recommendations  Home health PT Can patient physically be transported by private vehicle: Yes   Assistance Recommended at Discharge Intermittent Supervision/Assistance  Patient can return home with the following A little help with walking and/or transfers;A little help with bathing/dressing/bathroom;Assistance with cooking/housework;Direct supervision/assist for financial management;Assist for transportation;Help with stairs or ramp for entrance;Direct supervision/assist for medications management   Equipment Recommendations  None  recommended by PT    Recommendations for Other Services       Precautions / Restrictions Precautions Precautions: Fall Restrictions Weight Bearing Restrictions: No     Mobility  Bed Mobility               General bed mobility comments: pt sitting EOB at start of session, able to come fully into sitting in upright position modI    Transfers Overall transfer level: Needs assistance Equipment used: Rolling walker (2 wheels) Transfers: Sit to/from Stand Sit to Stand: Min guard           General transfer comment: extra time, but pt able to complete without physical assistance    Ambulation/Gait Ambulation/Gait assistance: Min guard, Supervision Gait Distance (Feet): 15 Feet Assistive device: Rolling walker (2 wheels)         General Gait Details: limited to in room ambulation, and lines/leads but pt spent majority of session in standing, including take some medication from RN.   Stairs             Wheelchair Mobility    Modified Rankin (Stroke Patients Only)       Balance Overall balance assessment: Needs assistance Sitting-balance support: Feet supported Sitting balance-Leahy Scale: Good     Standing balance support: No upper extremity supported   Standing balance comment: able to statically stand without UE to take medication and fluids from RN. CGA for safety but no LOB noted. at least unilateral support with any dynamic task/ambulation                            Cognition Arousal/Alertness: Awake/alert Behavior During Therapy: WFL for tasks assessed/performed Overall Cognitive Status: No family/caregiver present to determine baseline cognitive  functioning                                 General Comments: pt oriented to self, situation, displayed some emotional lability throughout session though much improved situational awareness. occasional redirection needed        Exercises Other Exercises Other  Exercises: pt did spontaneously start doing mini squats during session, CGA as well as standing knee flexion (LLE only)    General Comments        Pertinent Vitals/Pain Pain Assessment Pain Score: 10-Worst pain ever Pain Location: pt referenced 15/10 pain in L foot, chronic back pain Pain Descriptors / Indicators: Aching, Moaning, Guarding, Grimacing Pain Intervention(s): Limited activity within patient's tolerance, Monitored during session, Repositioned, RN gave pain meds during session    Home Living                          Prior Function            PT Goals (current goals can now be found in the care plan section) Progress towards PT goals: Progressing toward goals    Frequency    Min 2X/week      PT Plan Discharge plan needs to be updated    Co-evaluation PT/OT/SLP Co-Evaluation/Treatment: Yes Reason for Co-Treatment: To address functional/ADL transfers;For patient/therapist safety PT goals addressed during session: Mobility/safety with mobility;Balance;Proper use of DME OT goals addressed during session: ADL's and self-care;Strengthening/ROM;Proper use of Adaptive equipment and DME      AM-PAC PT "6 Clicks" Mobility   Outcome Measure  Help needed turning from your back to your side while in a flat bed without using bedrails?: A Little Help needed moving from lying on your back to sitting on the side of a flat bed without using bedrails?: A Little Help needed moving to and from a bed to a chair (including a wheelchair)?: A Little Help needed standing up from a chair using your arms (e.g., wheelchair or bedside chair)?: A Little Help needed to walk in hospital room?: None Help needed climbing 3-5 steps with a railing? : A Lot 6 Click Score: 18    End of Session Equipment Utilized During Treatment: Gait belt Activity Tolerance: Patient tolerated treatment well Patient left: in chair;with chair alarm set;with call bell/phone within reach Nurse  Communication: Mobility status PT Visit Diagnosis: Other abnormalities of gait and mobility (R26.89);Difficulty in walking, not elsewhere classified (R26.2);Muscle weakness (generalized) (M62.81)     Time: 0459-9774 PT Time Calculation (min) (ACUTE ONLY): 25 min  Charges:  $Therapeutic Activity: 8-22 mins                     Lieutenant Diego PT, DPT 3:03 PM,05/05/22

## 2022-05-05 NOTE — Progress Notes (Signed)
Occupational Therapy Treatment Patient Details Name: [name] MRN: [id number] DOB: 28-Oct-1960 Today's Date: 05/05/2022   History of present illness Pt is a 61 y.o., female with past medical history of anemia, asthma, diabetes, CAD, PAD, GERD, CHF, and end stage renal disease on dialysis. Patient present to ED with fever that began yesterday. Noted for L great toe wound as well.   OT comments  Pt seen for OT and co-tx with PT for safety. Pt progressing well towards goals, requiring less assist for mobility and ADL tasks. Pt required CGA for ADL transfers and mobility in the room with RW. Tolerated standing without UE support for medication administration with RN and no LOB. Pt educated in energy conservation strategies and activity pacing to support safety at home with return to ADL/IADL routines. Pt verbalized understanding. Updating discharge recommendation to [facility] based on progress.    Recommendations for follow up therapy are one component of a multi-disciplinary discharge planning process, led by the attending physician.  Recommendations may be updated based on patient status, additional functional criteria and insurance authorization.    Follow Up Recommendations  Home health OT    Assistance Recommended at Discharge Frequent or constant Supervision/Assistance  Patient can return home with the following  A little help with walking and/or transfers;A little help with bathing/dressing/bathroom;Assistance with cooking/housework;Assist for transportation;Help with stairs or ramp for entrance;Direct supervision/assist for medications management   Equipment Recommendations  None recommended by OT    Recommendations for Other Services      Precautions / Restrictions Precautions Precautions: Fall Restrictions Weight Bearing Restrictions: No       Mobility Bed Mobility               General bed mobility comments: pt sitting EOB at start of session, able to come fully into  sitting in upright position modI    Transfers Overall transfer level: Needs assistance Equipment used: Rolling walker (2 wheels) Transfers: Sit to/from Stand Sit to Stand: Min guard           General transfer comment: extra time, but pt able to complete without physical assistance     Balance Overall balance assessment: Needs assistance Sitting-balance support: Feet supported Sitting balance-Leahy Scale: Good     Standing balance support: No upper extremity supported Standing balance-Leahy Scale: Fair Standing balance comment: able to statically stand without UE to take medication and fluids from RN. CGA for safety but no LOB noted. at least unilateral support with any dynamic task/ambulation                           ADL either performed or assessed with clinical judgement   ADL Overall ADL's : Needs assistance/impaired Eating/Feeding: Supervision/ safety;Set up Eating/Feeding Details (indicate cue type and reason): pt manages taking medications in standing without UE support and no LOB with SBA-CGA                                        Extremity/Trunk Assessment              Vision       Perception     Praxis      Cognition Arousal/Alertness: Awake/alert Behavior During Therapy: WFL for tasks assessed/performed Overall Cognitive Status: No family/caregiver present to determine baseline cognitive functioning  General Comments: pt oriented to self, situation, displayed some emotional lability throughout session though much improved situational awareness. occasional redirection needed        Exercises Other Exercises Other Exercises: Pt educated in energy conservation strategies and activity pacing to support safety at home with return to ADL/IADL routines.    Shoulder Instructions       General Comments      Pertinent Vitals/ Pain       Pain Assessment Pain Assessment:  0-10 Pain Score: 10-Worst pain ever Pain Location: pt referenced 15/10 pain in L foot, chronic back pain Pain Descriptors / Indicators: Aching, Moaning, Guarding, Grimacing Pain Intervention(s): Limited activity within patient's tolerance, Monitored during session, Repositioned, Patient requesting pain meds-RN notified  Home Living                                          Prior Functioning/Environment              Frequency  Min 2X/week        Progress Toward Goals  OT Goals(current goals can now be found in the care plan section)  Progress towards OT goals: Progressing toward goals  Acute Rehab OT Goals Patient Stated Goal: get better and be independent OT Goal Formulation: With patient Time For Goal Achievement: 05/15/22 Potential to Achieve Goals: Good  Plan Frequency remains appropriate;Discharge plan needs to be updated    Co-evaluation    PT/OT/SLP Co-Evaluation/Treatment: Yes Reason for Co-Treatment: For patient/therapist safety;To address functional/ADL transfers PT goals addressed during session: Mobility/safety with mobility;Balance;Proper use of DME OT goals addressed during session: Proper use of Adaptive equipment and DME;ADL's and self-care;Strengthening/ROM      AM-PAC OT "6 Clicks" Daily Activity     Outcome Measure   Help from another person eating meals?: None Help from another person taking care of personal grooming?: None Help from another person toileting, which includes using toliet, bedpan, or urinal?: A Little Help from another person bathing (including washing, rinsing, drying)?: A Little Help from another person to put on and taking off regular upper body clothing?: None Help from another person to put on and taking off regular lower body clothing?: A Little 6 Click Score: 21    End of Session Equipment Utilized During Treatment: Gait belt;Rolling walker (2 wheels)  OT Visit Diagnosis: Other abnormalities of gait  and mobility (R26.89);Muscle weakness (generalized) (M62.81)   Activity Tolerance Patient limited by pain;Patient tolerated treatment well   Patient Left in chair;with call bell/phone within reach;with chair alarm set   Nurse Communication Mobility status;Patient requests pain meds        Time: 1412-1436 OT Time Calculation (min): 24 min  Charges: OT General Charges $OT Visit: 1 Visit OT Treatments $Self Care/Home Management : 8-22 mins  Ardeth Perfect., MPH, MS, OTR/L ascom [phone] 05/05/22, 4:27 PM

## 2022-05-05 NOTE — Plan of Care (Signed)

## 2022-05-05 NOTE — TOC Progression Note (Signed)
Transition of Care [facility]) - Progression Note    Patient Details  Name: [name] MRN: [id number] Date of Birth: 1960-10-06  Transition of [facility] CM/SW Contact  Laurena Slimmer, RN Phone Number: 05/05/2022, 2:12 PM  Clinical Narrative:    Spoke with patient regarding bed offer for [facility]. Patient is not agreeable to bed offer.         Expected Discharge Plan and Services                                                 Social Determinants of Health (SDOH) Interventions    Readmission Risk Interventions    05/03/2021    3:02 PM 12/11/2020    9:23 AM 09/21/2020    9:39 AM  Readmission Risk Prevention Plan  Transportation Screening Complete Complete Complete  Medication Review Press photographer) Complete Complete Complete  PCP or Specialist appointment within 3-5 days of discharge  Complete Complete  HRI or Home Care Consult  Complete Complete  SW Recovery Care/Counseling Consult  Complete Complete  Palliative Care Screening Complete Not Applicable Complete  [place]  Not Complete Complete  SNF Comments  working on placement

## 2022-05-05 NOTE — Discharge Summary (Addendum)
Physician Discharge Summary  [name]:[id number] DOB: Feb 07, 1961 DOA: 04/28/2022  PCP: Denton Lank, MD  Admit date: 04/28/2022 Discharge date: 05/05/2022  Admitted From: home  Disposition:  home (w/ possible HH, CM is trying to set [facility] up)   Recommendations for Outpatient Follow-up:  Follow up with PCP in 1-2 weeks F/u w/ nephro in 1-2 weeks   Home Health:  Equipment/Devices:  Discharge Condition: stable  CODE STATUS: full  Diet recommendation: Heart Healthy / Carb Modified   Brief/Interim Summary: HPI was taken from Dr. Florina Ou: [name] is a 61 y.o. female seen in ed with complaints of fever with a temperature of 102.4 and O2 sats of 94% blood pressure 126/59 with a fasting blood sugar of 165 as noted per initial EMS evaluation.  Patient is a hemodialysis on Tuesdays Thursdays and Saturdays, patient comes to [place] with fever. Patient is very sleepy and cooperative and oriented. She does tell me that she was in her normal state of health this morning and feeding her birds.  She denies any respiratory symptoms or chest pain denies abdominal pain vomiting diarrhea does not make urine.  Does complain of headache no visual change or neck pain.   Pt has past medical history of anemia, chronic diastolic congestive heart failure, COPD, depression, diabetes mellitus type 2, end-stage renal disease on hemodialysis, history of UTI, GERD, history of heart disease, allergy to 12 medications selected, platelets 10 review.   As per Dr. Billie Ruddy: [name] is a 61 y.o., female with past medical history of anemia, asthma, diabetes, CAD, PAD, and end stage renal disease on dialysis (with PermCath) who presented to ED with fever and feeling unwell, no other symptoms.   Temp 103.1 on presentation, WBC 11.6.  CT c/a/p showed no acute finding to indicate infection.  COVID and RVP neg.  UA was not obtained and pt making very little urine.  Initial blood cx x2 neg so far.  Currently no source of infection,  however, procal elevated to 4.45.   Pt received IV vanc, cefepime and flagyl on presentation, which were not continued since there is no source of infection.  I have asked nephrology to obtain a blood cx from the PermCath site.   As per Dr. Jimmye Norman 8/9-8/14/23: Pt was found to have left great toe wound that was present on admission and was started on abxs. Wound cx grew enterobacter cloacae & enterococcus faecalis. Pt was d/c home po cipro to complete the course. PT/OT evaluated the pt and initially recommended SNF. SNF placement was on going until PT re-evaluated the pt on 05/05/22 and stated pt had improved and was now recommending home health. Home health was set up by CM prior to d/c. For more information, please see previous progress/consult notes.     Discharge Diagnoses:  Principal Problem:   Fever Active Problems:   End-stage renal disease on hemodialysis (HCC)   HTN (hypertension)   Diabetes mellitus, type II (HCC)   Chronic diastolic CHF (congestive heart failure) (HCC)  Fever: etiology unclear, possibly secondary left great toe wound. Left great toe wound cx growing enterobacter  & enterococcus and abxs changed to IV zosyn. D/c home w/ po cipro to complete the course. Blood cxs NGTD. COVID19 & viral respiratory panel are both neg. Afebrile > 72 hrs   Left great toe wound: present on admission. Wound cx growing enterobacter cloacae & enterococcus faecalis. Changed abxs to IV zosyn. Continue w/ wound care  Dyspnea/Acute hypoxic respiratory failure:  SaO2 dropped in 60s w/ ambulating 05/02/22 as per pt's nurse. CXR shows increased vascular congestion/interstitial edema & ongoing small pleural effusions. Encourage incentive spirometry. Weaned off of supplemental oxygen prior to d/c   Generalized weakness: PT/OT recs SNF initially but now PT recs home health. HH set up by CM   ESRD: on HD TTS. Nephro recs apprec   Hyponatremia: will be managed w/ HD   Hyperkalemia: WNL today     ACD: secondary to ESRD. Will transfuse if Hb < 7.0  HTN: continue on amlodipine. Hydralazine prn   DM2: well controlled, HbA1c 6.2. Carb modified diet   Chronic diastolic CHF: volume management w/ HD    Thrombocytopenia: labile   Obesity: BMI 30.8. Complicates overall care & prognosis   Discharge Instructions  Discharge Instructions     Diet - low sodium heart healthy   Complete by: As directed    Diet Carb Modified   Complete by: As directed    Discharge instructions   Complete by: As directed    F/u w/ PCP in 1-2 weeks. F/u w/ nephro in 1-2 weeks.   Increase activity slowly   Complete by: As directed    No wound care   Complete by: As directed       Allergies as of 05/05/2022       Reactions   Ace Inhibitors Swelling, Anaphylaxis   Ativan [lorazepam] Other (See Comments)   Reaction:  Hallucinations and headaches   Compazine [prochlorperazine Edisylate] Anaphylaxis, Nausea And Vomiting, Other (See Comments)   Other reaction(s): dystonia from this vs. Reglan, 23 Jul - patient relates that she takes promethazine frequently with no problems   Sumatriptan Succinate Other (See Comments)   Other reaction(s): delirium and hallucinations per [facility] records   Zofran [ondansetron] Nausea And Vomiting   Per pt. she is allergic to zofran or will experience adverse reaction like hallucination    Losartan Nausea Only   Prochlorperazine Other (See Comments)   Reaction:  Unknown . Patient does not remember reaction but she does have vertigo and anxiety along with n and v at times. Could be used to treat any of these   Reglan [metoclopramide] Other (See Comments)   Per patient her Dr. Evelina Bucy her off it    Scopolamine Other (See Comments)   Dizziness, also has vertigo already   Tape Rash   Plastic tape causes rash   Tapentadol Rash   Ultrasound Gel Itching   Patient states the Ultrasound gel caused itching while in skin contact and resolved when wiped away.        Medication  List     STOP taking these medications    guaiFENesin-dextromethorphan 100-10 MG/5ML syrup Commonly known as: ROBITUSSIN DM       TAKE these medications    acetaminophen 325 MG tablet Commonly known as: TYLENOL Take 2 tablets (650 mg total) by mouth every 6 (six) hours as needed for mild pain, headache, moderate pain or fever (fever >/= 101).   albuterol (2.5 MG/3ML) 0.083% nebulizer solution Commonly known as: PROVENTIL Take 2.5 mg by nebulization every 4 (four) hours as needed for wheezing or shortness of breath.   albuterol 108 (90 Base) MCG/ACT inhaler Commonly known as: VENTOLIN HFA Inhale 2 puffs into the lungs every 4 (four) hours as needed for wheezing or shortness of breath.   alum & mag hydroxide-simeth 200-200-20 MG/5ML suspension Commonly known as: MAALOX/MYLANTA Take 15 mLs by mouth every 6 (six) hours as needed for indigestion or heartburn.  amLODipine 10 MG tablet Commonly known as: NORVASC Take 1 tablet (10 mg total) by mouth daily.   ammonium lactate 12 % lotion Commonly known as: LAC-HYDRIN Apply 1 application topically 2 (two) times daily as needed for dry skin.   aspirin EC 81 MG tablet Take 81 mg by mouth daily.   atorvastatin 20 MG tablet Commonly known as: LIPITOR Take 20 mg by mouth every evening.   calcium carbonate 500 MG chewable tablet Commonly known as: TUMS - dosed in mg elemental calcium Chew 2.5 tablets (500 mg of elemental calcium total) by mouth every 6 (six) hours as needed for indigestion.   ciprofloxacin 500 MG tablet Commonly known as: Cipro Take 1 tablet (500 mg total) by mouth 2 (two) times daily for 3 days.   Creon 36000 UNITS Cpep capsule Generic drug: lipase/protease/amylase Take 36,000 Units by mouth. Take 2 capsules with meals and 1 capsule with snacks   cyanocobalamin 1000 MCG tablet Take 1,000 mcg by mouth daily.   epoetin alfa 10000 UNIT/ML injection Commonly known as: EPOGEN Inject 1 mL (10,000 Units total)  into the vein every Monday, Wednesday, and Friday with hemodialysis.   feeding supplement (NEPRO CARB STEADY) Liqd Take 237 mLs by mouth 2 (two) times daily between meals.   fluticasone 50 MCG/ACT nasal spray Commonly known as: FLONASE Place 2 sprays into both nostrils daily.   gabapentin 100 MG capsule Commonly known as: NEURONTIN Take 100 mg by mouth 3 (three) times daily.   hydrALAZINE 100 MG tablet Commonly known as: APRESOLINE Take 0.5 tablets (50 mg total) by mouth 2 (two) times daily. What changed: Another medication with the same name was removed. Continue taking this medication, and follow the directions you see here.   HYDROcodone-acetaminophen 5-325 MG tablet Commonly known as: NORCO/VICODIN Take 1 tablet by mouth as directed.   Iron 325 (65 Fe) MG Tabs Take 325 mg by mouth daily.   lidocaine 5 % Commonly known as: LIDODERM Place 1 patch onto the skin daily. (Remove after 12 hours -- leave off for 12 hours before applying new patch)   meclizine 25 MG tablet Commonly known as: ANTIVERT Take 25 mg by mouth every 6 (six) hours as needed for dizziness.   mirtazapine 7.5 MG tablet Commonly known as: REMERON Take 7.5 mg by mouth daily.   multivitamin Tabs tablet Take 1 tablet by mouth daily.   mupirocin ointment 2 % Commonly known as: BACTROBAN Apply 1 application topically daily as needed (leg rash).   nitroGLYCERIN 0.4 MG SL tablet Commonly known as: NITROSTAT Place 0.4 mg under the tongue every 5 (five) minutes as needed for chest pain.   pantoprazole 40 MG tablet Commonly known as: PROTONIX Take 1 tablet (40 mg total) by mouth 2 (two) times daily.   pantoprazole 40 MG tablet Commonly known as: PROTONIX Take 40 mg by mouth daily.   polyethylene glycol 17 g packet Commonly known as: MIRALAX / GLYCOLAX Take 17 g by mouth daily as needed. What changed: reasons to take this   PROBIOTIC PO Take 1 capsule by mouth daily.   promethazine 12.5 MG  tablet Commonly known as: PHENERGAN Take 1 tablet (12.5 mg total) by mouth every 6 (six) hours as needed for nausea or vomiting.   sevelamer carbonate 800 MG tablet Commonly known as: RENVELA Take 800 mg by mouth 4 (four) times daily. (Take with meals and snack)   Vitamin D3 25 MCG (1000 UT) Caps Take 1,000 Units by mouth daily.  Allergies  Allergen Reactions   Ace Inhibitors Swelling and Anaphylaxis   Ativan [Lorazepam] Other (See Comments)    Reaction:  Hallucinations and headaches   Compazine [Prochlorperazine Edisylate] Anaphylaxis, Nausea And Vomiting and Other (See Comments)    Other reaction(s): dystonia from this vs. Reglan, 23 Jul - patient relates that she takes promethazine frequently with no problems   Sumatriptan Succinate Other (See Comments)    Other reaction(s): delirium and hallucinations per [facility] records   Zofran [Ondansetron] Nausea And Vomiting    Per pt. she is allergic to zofran or will experience adverse reaction like hallucination    Losartan Nausea Only   Prochlorperazine Other (See Comments)    Reaction:  Unknown . Patient does not remember reaction but she does have vertigo and anxiety along with n and v at times. Could be used to treat any of these    Reglan [Metoclopramide] Other (See Comments)    Per patient her Dr. Evelina Bucy her off it    Scopolamine Other (See Comments)    Dizziness, also has vertigo already   Tape Rash    Plastic tape causes rash   Tapentadol Rash   Ultrasound Gel Itching    Patient states the Ultrasound gel caused itching while in skin contact and resolved when wiped away.    Consultations: Nephro    Procedures/Studies: DG Chest Port 1 View  Result Date: 05/02/2022 CLINICAL DATA:  61 year old female with fever. Left great toe wound. End stage renal disease on dialysis. EXAM: PORTABLE CHEST 1 VIEW COMPARISON:  CT Chest, Abdomen, and Pelvis today are reported separately. 04/28/2022 and earlier. FINDINGS: Portable AP  view at 0906 hours. Stable right chest dual lumen dialysis catheter. Mildly lower lung volumes since 04/28/2022. Mildly increased interstitial markings diffusely. Evidence of ongoing small bilateral pleural effusions. Stable cardiac size and mediastinal contours. Stable left subclavian and axillary region vascular stent. No pneumothorax. No consolidation. No acute osseous abnormality identified. Negative visible bowel gas. IMPRESSION: Mildly lower lung volumes since 04/28/2022 with increased vascular congestion/interstitial edema and ongoing small pleural effusions. Electronically Signed   By: Genevie Ann M.D.   On: 05/02/2022 09:17   CT CHEST ABDOMEN PELVIS WO CONTRAST  Result Date: 04/28/2022 CLINICAL DATA:  Pneumonia, complication suspected, xray done EXAM: CT CHEST, ABDOMEN AND PELVIS WITHOUT CONTRAST TECHNIQUE: Multidetector CT imaging of the chest, abdomen and pelvis was performed following the standard protocol without IV contrast. RADIATION DOSE REDUCTION: This exam was performed according to the departmental dose-optimization program which includes automated exposure control, adjustment of the mA and/or kV according to patient size and/or use of iterative reconstruction technique. COMPARISON:  Radiograph earlier today. Chest CT 12/03/2021. Abdominopelvic CT 11/15/2021 FINDINGS: CT CHEST FINDINGS Cardiovascular: Right-sided dialysis catheter tip in the right atrium. Mild cardiomegaly with coronary artery calcifications. Aortic atherosclerosis. Mild dilatation of the main pulmonary artery at 3.2 cm. No pericardial effusion. Left subclavian stent in place. Multiple vascular stents in the forearm. There are mild chest wall collaterals. Mediastinum/Nodes: Multifocal mediastinal adenopathy with improvement from prior exam. For example AP window node measures 8 mm short axis previously 11 mm. Anterior lower paratracheal node measures 11 mm, previously 14 mm. Patulous esophagus. No visible thyroid nodule.  Lungs/Pleura: Small right and trace left pleural effusion, diminished in size from prior CT. There is associated atelectasis in the lung bases. Mild heterogeneous pulmonary parenchyma. Occasional areas of subpleural reticulation in both lungs. No confluent consolidation. Unremarkable appearance of the trachea and central bronchi. Musculoskeletal: No acute osseous findings.  Mild diffuse sclerosis and ground-glass opacities the osseous structures consistent with renal osteodystrophy. Chronic areas of endplate sclerosis and irregularity at the T7-T8, unchanged. CT ABDOMEN PELVIS FINDINGS Hepatobiliary: No focal hepatic abnormality on this unenhanced exam. Clips in the gallbladder fossa postcholecystectomy. No biliary dilatation. Pancreas: Mild fatty atrophy.  No ductal dilatation or inflammation. Spleen: Normal in size without focal abnormality. Adrenals/Urinary Tract: No adrenal nodule. Sequela of chronic renal disease with marked renal atrophy and renal vascular calcifications. Small simple cyst in the mid left kidney, needs no further follow-up. Suspicious renal lesion. Decompressed urinary bladder. Stomach/Bowel: Partially distended stomach. No small bowel obstruction or inflammation. Appendix is not definitively visualized. Moderate to large volume of stool throughout the colon. Few diverticula in the right and sigmoid colon without diverticulitis. No colonic inflammation. Vascular/Lymphatic: Advanced aortic and branch atherosclerosis. No aneurysm. No portal venous gas. No enlarged lymph nodes in the abdomen. There are prominent bilateral inguinal and left external iliac nodes, largest measuring 14 mm in the low left inguinal region. Reproductive: Status post hysterectomy. No adnexal masses. There is air in the vagina, nonspecific. Other: No ascites. No free air. There is generalized body wall edema, although improved from prior exam. Musculoskeletal: No acute osseous findings. Mild generalized sclerosis and  ground-glass opacity of the osseous structures consistent with renal osteodystrophy. Small Schmorl's node superior endplates of L2 and L3. IMPRESSION: 1. Small right and trace left pleural effusions, diminished in size from March CT. There is associated atelectasis in the lung bases. 2. Mild heterogeneous pulmonary parenchyma, can be seen with small vessel or small airways disease. 3. Mild mediastinal adenopathy, improved from prior exam, likely reactive. 4. No acute abnormality in the abdomen/pelvis. 5. Moderate to large volume of stool throughout the colon, can be seen with constipation. Mild colonic diverticulosis without diverticulitis. 6. Sequela of chronic renal disease with marked renal atrophy and renal vascular calcifications. 7. Prominent bilateral inguinal and left external iliac nodes, likely reactive but nonspecific. These are similar to prior exam. Aortic Atherosclerosis (ICD10-I70.0). Electronically Signed   By: Keith Rake M.D.   On: 04/28/2022 19:45   CT HEAD WO CONTRAST (5MM)  Result Date: 04/28/2022 CLINICAL DATA:  Altered mental status EXAM: CT HEAD WITHOUT CONTRAST TECHNIQUE: Contiguous axial images were obtained from the base of the skull through the vertex without intravenous contrast. RADIATION DOSE REDUCTION: This exam was performed according to the departmental dose-optimization program which includes automated exposure control, adjustment of the mA and/or kV according to patient size and/or use of iterative reconstruction technique. COMPARISON:  04/29/2021 FINDINGS: Brain: There is no mass, hemorrhage or extra-axial collection. The size and configuration of the ventricles and extra-axial CSF spaces are normal. The brain parenchyma is normal, without acute or chronic infarction. Vascular: No abnormal hyperdensity of the major intracranial arteries or dural venous sinuses. No intracranial atherosclerosis. Skull: The visualized skull base, calvarium and extracranial soft tissues are  normal. Sinuses/Orbits: No fluid levels or advanced mucosal thickening of the visualized paranasal sinuses. No mastoid or middle ear effusion. The orbits are normal. IMPRESSION: Normal head CT. Electronically Signed   By: Ulyses Jarred M.D.   On: 04/28/2022 19:31   DG Chest Port 1 View  Result Date: 04/28/2022 CLINICAL DATA:  Fever, body aches EXAM: PORTABLE CHEST 1 VIEW COMPARISON:  12/02/2021 FINDINGS: Single frontal view of the chest demonstrates stable enlarged cardiac silhouette. Right internal jugular dialysis catheter unchanged. There is increased central vascular congestion, with diffuse interstitial and ground-glass opacities consistent with edema. Trace bilateral  effusions. No pneumothorax. Stable vascular stent in the left subclavian distribution. No acute bony abnormality. IMPRESSION: 1. Findings most consistent with mild congestive heart failure and edema. Superimposed infection would be difficult to exclude. Electronically Signed   By: Randa Ngo M.D.   On: 04/28/2022 18:32   (Echo, Carotid, EGD, Colonoscopy, ERCP)    Subjective: Pt c/o fatigue    Discharge Exam: Vitals:   05/05/22 0506 05/05/22 0828  BP: (!) 147/56 (!) 166/64  Pulse: 63 66  Resp: 16 16  Temp: (!) 97.3 F (36.3 C) 97.7 F (36.5 C)  SpO2: 100% 100%   Vitals:   05/04/22 2010 05/05/22 0500 05/05/22 0506 05/05/22 0828  BP: (!) 150/83  (!) 147/56 (!) 166/64  Pulse: 74  63 66  Resp: '18  16 16  '$ Temp: 98.1 F (36.7 C)  (!) 97.3 F (36.3 C) 97.7 F (36.5 C)  TempSrc: Oral  Oral   SpO2: 99%  100% 100%  Weight:  76.9 kg    Height:        General: Pt is alert, awake, not in acute distress Cardiovascular: S1/S2 +, no rubs, no gallops Respiratory: decreased breath sounds b/l  Abdominal: Soft, NT, obese, bowel sounds + Extremities:  no cyanosis    The results of significant diagnostics from this hospitalization (including imaging, microbiology, ancillary and laboratory) are listed below for reference.      Microbiology: Recent Results (from the past 240 hour(s))  Culture, blood (Routine x 2)     Status: None   Collection Time: 04/28/22  5:59 PM   Specimen: BLOOD  Result Value Ref Range Status   Specimen Description BLOOD BLOOD RIGHT FOREARM  Final   Special Requests   Final    BOTTLES DRAWN AEROBIC AND ANAEROBIC Blood Culture adequate volume   Culture   Final    NO GROWTH 5 DAYS Performed at [facility], [address], [place], [place] [address]    Report Status 05/03/2022 FINAL  Final  Culture, blood (Routine x 2)     Status: None   Collection Time: 04/28/22  6:04 PM   Specimen: BLOOD  Result Value Ref Range Status   Specimen Description BLOOD BLOOD RIGHT FOREARM  Final   Special Requests   Final    BOTTLES DRAWN AEROBIC AND ANAEROBIC Blood Culture adequate volume   Culture   Final    NO GROWTH 5 DAYS Performed at [facility], [address], [place], [place] [address]    Report Status 05/03/2022 FINAL  Final  SARS Coronavirus 2 by RT PCR (hospital order, performed in [facility] hospital lab) *cepheid single result test* Anterior Nasal Swab     Status: None   Collection Time: 04/28/22  8:09 PM   Specimen: Anterior Nasal Swab  Result Value Ref Range Status   SARS Coronavirus 2 by RT PCR NEGATIVE NEGATIVE Final    Comment: (NOTE) SARS-CoV-2 target nucleic acids are NOT DETECTED.  The SARS-CoV-2 RNA is generally detectable in upper and lower respiratory specimens during the acute phase of infection. The lowest concentration of SARS-CoV-2 viral copies this assay can detect is 250 copies / mL. A negative result does not preclude SARS-CoV-2 infection and should not be used as the sole basis for treatment or other patient management decisions.  A negative result may occur with improper specimen collection / handling, submission of specimen other than nasopharyngeal swab, presence of viral mutation(s) within the areas targeted by this assay, and  inadequate number of  viral copies (<250 copies / mL). A negative result must be combined with clinical observations, patient history, and epidemiological information.  Fact Sheet for Patients:   https://www.patel.info/  Fact Sheet for Healthcare Providers: https://hall.com/  This test is not yet approved or  cleared by the [place] FDA and has been authorized for detection and/or diagnosis of SARS-CoV-2 by FDA under an Emergency Use Authorization (EUA).  This EUA will remain in effect (meaning this test can be used) for the duration of the COVID-19 declaration under Section 564(b)(1) of the Act, 21 U.S.C. section 360bbb-3(b)(1), unless the authorization is terminated or revoked sooner.  Performed at [facility], [place], [place] [address]   Respiratory (~20 pathogens) panel by PCR     Status: None   Collection Time: 04/29/22  1:15 PM   Specimen: Nasopharyngeal Swab; Respiratory  Result Value Ref Range Status   Adenovirus NOT DETECTED NOT DETECTED Final   Coronavirus 229E NOT DETECTED NOT DETECTED Final    Comment: (NOTE) The Coronavirus on the Respiratory Panel, DOES NOT test for the novel  Coronavirus (2019 nCoV)    Coronavirus HKU1 NOT DETECTED NOT DETECTED Final   Coronavirus NL63 NOT DETECTED NOT DETECTED Final   Coronavirus OC43 NOT DETECTED NOT DETECTED Final   Metapneumovirus NOT DETECTED NOT DETECTED Final   Rhinovirus / Enterovirus NOT DETECTED NOT DETECTED Final   Influenza A NOT DETECTED NOT DETECTED Final   Influenza B NOT DETECTED NOT DETECTED Final   Parainfluenza Virus 1 NOT DETECTED NOT DETECTED Final   Parainfluenza Virus 2 NOT DETECTED NOT DETECTED Final   Parainfluenza Virus 3 NOT DETECTED NOT DETECTED Final   Parainfluenza Virus 4 NOT DETECTED NOT DETECTED Final   Respiratory Syncytial Virus NOT DETECTED NOT DETECTED Final   Bordetella pertussis NOT DETECTED NOT DETECTED Final    Bordetella Parapertussis NOT DETECTED NOT DETECTED Final   Chlamydophila pneumoniae NOT DETECTED NOT DETECTED Final   Mycoplasma pneumoniae NOT DETECTED NOT DETECTED Final    Comment: Performed at [facility], [place]. [address], [place], [place] [address]  Culture, blood (Routine X 2) w Reflex to ID Panel     Status: None   Collection Time: 04/30/22  1:20 AM   Specimen: BLOOD  Result Value Ref Range Status   Specimen Description BLOOD PORTA CATH  Final   Special Requests   Final    BOTTLES DRAWN AEROBIC AND ANAEROBIC Blood Culture adequate volume   Culture   Final    NO GROWTH 5 DAYS Performed at [facility], [address], [place], [place] [address]    Report Status 05/05/2022 FINAL  Final  Aerobic/Anaerobic Culture w Gram Stain (surgical/deep wound)     Status: None   Collection Time: 04/30/22 12:42 PM   Specimen: Wound  Result Value Ref Range Status   Specimen Description WOUND  Final   Special Requests LEFT FOOT  Final   Gram Stain   Final    NO WBC SEEN RARE GRAM POSITIVE COCCI RARE GRAM POSITIVE RODS    Culture   Final    FEW ENTEROBACTER CLOACAE FEW ENTEROCOCCUS FAECALIS NO ANAEROBES ISOLATED Performed at [facility], [place] [address] [place],  [address]    Report Status 05/05/2022 FINAL  Final   Organism ID, Bacteria ENTEROBACTER CLOACAE  Final   Organism ID, Bacteria ENTEROCOCCUS FAECALIS  Final      Susceptibility   Enterobacter cloacae - MIC*    CEFAZOLIN >=64 RESISTANT  Resistant     CEFEPIME <=0.12 SENSITIVE Sensitive     CEFTAZIDIME <=1 SENSITIVE Sensitive     CIPROFLOXACIN <=0.25 SENSITIVE Sensitive     GENTAMICIN <=1 SENSITIVE Sensitive     IMIPENEM <=0.25 SENSITIVE Sensitive     TRIMETH/SULFA <=20 SENSITIVE Sensitive     PIP/TAZO <=4 SENSITIVE Sensitive     * FEW ENTEROBACTER CLOACAE   Enterococcus faecalis - MIC*    AMPICILLIN <=2 SENSITIVE Sensitive     VANCOMYCIN 1 SENSITIVE Sensitive     GENTAMICIN SYNERGY SENSITIVE  Sensitive     * FEW ENTEROCOCCUS FAECALIS     Labs: BNP (last 3 results) Recent Labs    12/04/21 2019 04/28/22 1824  BNP 498.8* 277.4*   Basic Metabolic Panel: Recent Labs  Lab 04/30/22 0402 05/01/22 0352 05/02/22 0411 05/03/22 0612 05/04/22 0436 05/05/22 0544  NA 134* 134* 133* 129* 133* 131*  K 4.7 5.3* 4.3 4.6 4.1 4.8  CL 97* 95* 95* 92* 96* 96*  CO2 '28 27 29 28 28 24  '$ GLUCOSE 97 86 181* 137* 181* 162*  BUN 22 33* 18 27* 19 29*  CREATININE 4.07* 5.18* 3.64* 5.26* 3.50* 4.72*  CALCIUM 8.0* 8.1* 8.2* 8.2* 8.2* 8.5*  MG 1.8 2.0 1.8 2.0 1.7  --    Liver Function Tests: Recent Labs  Lab 04/28/22 1759 04/29/22 0602  AST 24 43*  ALT 17 32  ALKPHOS 91 74  BILITOT 0.6 1.0  PROT 7.6 6.1*  ALBUMIN 3.9 2.8*   No results for input(s): "LIPASE", "AMYLASE" in the last 168 hours. No results for input(s): "AMMONIA" in the last 168 hours. CBC: Recent Labs  Lab 04/28/22 1759 04/29/22 0602 05/01/22 0352 05/02/22 0411 05/03/22 0612 05/04/22 0436 05/05/22 0544  WBC 9.6   < > 5.7 4.4 6.1 4.8 5.2  NEUTROABS 7.5  --   --   --   --   --   --   HGB 8.8*   < > 7.7* 7.5* 7.7* 7.4* 7.7*  HCT 27.5*   < > 23.1* 23.5* 23.6* 22.6* 23.5*  MCV 94.5   < > 92.4 95.1 94.0 94.6 92.9  PLT 176   < > 131* 136* 142* 144* 180   < > = values in this interval not displayed.   Cardiac Enzymes: Recent Labs  Lab 04/28/22 1759  CKTOTAL 35*   BNP: Invalid input(s): "POCBNP" CBG: Recent Labs  Lab 05/04/22 1150 05/04/22 1633 05/04/22 2012 05/05/22 0818 05/05/22 1217  GLUCAP 165* 189* 157* 172* 115*   D-Dimer No results for input(s): "DDIMER" in the last 72 hours. Hgb A1c No results for input(s): "HGBA1C" in the last 72 hours. Lipid Profile No results for input(s): "CHOL", "HDL", "LDLCALC", "TRIG", "CHOLHDL", "LDLDIRECT" in the last 72 hours. Thyroid function studies No results for input(s): "TSH", "T4TOTAL", "T3FREE", "THYROIDAB" in the last 72 hours.  Invalid input(s):  "FREET3" Anemia work up No results for input(s): "VITAMINB12", "FOLATE", "FERRITIN", "TIBC", "IRON", "RETICCTPCT" in the last 72 hours. Urinalysis    Component Value Date/Time   COLORURINE YELLOW (A) 03/29/2021 0020   APPEARANCEUR TURBID (A) 03/29/2021 0020   APPEARANCEUR Clear 09/15/2014 0947   LABSPEC 1.010 03/29/2021 0020   LABSPEC 1.008 09/15/2014 0947   PHURINE 5.0 03/29/2021 0020   GLUCOSEU >=500 (A) 03/29/2021 0020   GLUCOSEU 150 mg/dL 09/15/2014 0947   HGBUR MODERATE (A) 03/29/2021 0020   BILIRUBINUR NEGATIVE 03/29/2021 0020   BILIRUBINUR Negative 09/15/2014 [place] 03/29/2021 0020   PROTEINUR  100 (A) 03/29/2021 0020   UROBILINOGEN 0.2 06/30/2010 2130   NITRITE NEGATIVE 03/29/2021 0020   LEUKOCYTESUR LARGE (A) 03/29/2021 0020   LEUKOCYTESUR Negative 09/15/2014 0947   Sepsis Labs Recent Labs  Lab 05/02/22 0411 05/03/22 0612 05/04/22 0436 05/05/22 0544  WBC 4.4 6.1 4.8 5.2   Microbiology Recent Results (from the past 240 hour(s))  Culture, blood (Routine x 2)     Status: None   Collection Time: 04/28/22  5:59 PM   Specimen: BLOOD  Result Value Ref Range Status   Specimen Description BLOOD BLOOD RIGHT FOREARM  Final   Special Requests   Final    BOTTLES DRAWN AEROBIC AND ANAEROBIC Blood Culture adequate volume   Culture   Final    NO GROWTH 5 DAYS Performed at [facility], [address] [place], [place] [address]    Report Status 05/03/2022 FINAL  Final  Culture, blood (Routine x 2)     Status: None   Collection Time: 04/28/22  6:04 PM   Specimen: BLOOD  Result Value Ref Range Status   Specimen Description BLOOD BLOOD RIGHT FOREARM  Final   Special Requests   Final    BOTTLES DRAWN AEROBIC AND ANAEROBIC Blood Culture adequate volume   Culture   Final    NO GROWTH 5 DAYS Performed at [facility], [address] [place], [place] [address]    Report Status 05/03/2022 FINAL  Final  SARS Coronavirus 2 by RT PCR  (hospital order, performed in [facility] hospital lab) *cepheid single result test* Anterior Nasal Swab     Status: None   Collection Time: 04/28/22  8:09 PM   Specimen: Anterior Nasal Swab  Result Value Ref Range Status   SARS Coronavirus 2 by RT PCR NEGATIVE NEGATIVE Final    Comment: (NOTE) SARS-CoV-2 target nucleic acids are NOT DETECTED.  The SARS-CoV-2 RNA is generally detectable in upper and lower respiratory specimens during the acute phase of infection. The lowest concentration of SARS-CoV-2 viral copies this assay can detect is 250 copies / mL. A negative result does not preclude SARS-CoV-2 infection and should not be used as the sole basis for treatment or other patient management decisions.  A negative result may occur with improper specimen collection / handling, submission of specimen other than nasopharyngeal swab, presence of viral mutation(s) within the areas targeted by this assay, and inadequate number of viral copies (<250 copies / mL). A negative result must be combined with clinical observations, patient history, and epidemiological information.  Fact Sheet for Patients:   https://www.patel.info/  Fact Sheet for Healthcare Providers: https://hall.com/  This test is not yet approved or  cleared by the [place] FDA and has been authorized for detection and/or diagnosis of SARS-CoV-2 by FDA under an Emergency Use Authorization (EUA).  This EUA will remain in effect (meaning this test can be used) for the duration of the COVID-19 declaration under Section 564(b)(1) of the Act, 21 U.S.C. section 360bbb-3(b)(1), unless the authorization is terminated or revoked sooner.  Performed at [facility], [place], [place] [address]   Respiratory (~20 pathogens) panel by PCR     Status: None   Collection Time: 04/29/22  1:15 PM   Specimen: Nasopharyngeal Swab; Respiratory  Result Value Ref Range  Status   Adenovirus NOT DETECTED NOT DETECTED Final   Coronavirus 229E NOT DETECTED NOT DETECTED Final    Comment: (NOTE) The Coronavirus on the Respiratory Panel, DOES NOT test for the novel  Coronavirus (2019 nCoV)    Coronavirus HKU1 NOT DETECTED NOT DETECTED Final   Coronavirus NL63 NOT DETECTED NOT DETECTED Final   Coronavirus OC43 NOT DETECTED NOT DETECTED Final   Metapneumovirus NOT DETECTED NOT DETECTED Final   Rhinovirus / Enterovirus NOT DETECTED NOT DETECTED Final   Influenza A NOT DETECTED NOT DETECTED Final   Influenza B NOT DETECTED NOT DETECTED Final   Parainfluenza Virus 1 NOT DETECTED NOT DETECTED Final   Parainfluenza Virus 2 NOT DETECTED NOT DETECTED Final   Parainfluenza Virus 3 NOT DETECTED NOT DETECTED Final   Parainfluenza Virus 4 NOT DETECTED NOT DETECTED Final   Respiratory Syncytial Virus NOT DETECTED NOT DETECTED Final   Bordetella pertussis NOT DETECTED NOT DETECTED Final   Bordetella Parapertussis NOT DETECTED NOT DETECTED Final   Chlamydophila pneumoniae NOT DETECTED NOT DETECTED Final   Mycoplasma pneumoniae NOT DETECTED NOT DETECTED Final    Comment: Performed at [facility], [place] [address], [place], [place] [address]  Culture, blood (Routine X 2) w Reflex to ID Panel     Status: None   Collection Time: 04/30/22  1:20 AM   Specimen: BLOOD  Result Value Ref Range Status   Specimen Description BLOOD PORTA CATH  Final   Special Requests   Final    BOTTLES DRAWN AEROBIC AND ANAEROBIC Blood Culture adequate volume   Culture   Final    NO GROWTH 5 DAYS Performed at [facility], [address] [place], [place] [address]    Report Status 05/05/2022 FINAL  Final  Aerobic/Anaerobic Culture w Gram Stain (surgical/deep wound)     Status: None   Collection Time: 04/30/22 12:42 PM   Specimen: Wound  Result Value Ref Range Status   Specimen Description WOUND  Final   Special Requests LEFT FOOT  Final   Gram Stain   Final    NO WBC  SEEN RARE GRAM POSITIVE COCCI RARE GRAM POSITIVE RODS    Culture   Final    FEW ENTEROBACTER CLOACAE FEW ENTEROCOCCUS FAECALIS NO ANAEROBES ISOLATED Performed at [facility], [place] [address] [place], [place] [address]    Report Status 05/05/2022 FINAL  Final   Organism ID, Bacteria ENTEROBACTER CLOACAE  Final   Organism ID, Bacteria ENTEROCOCCUS FAECALIS  Final      Susceptibility   Enterobacter cloacae - MIC*    CEFAZOLIN >=64 RESISTANT Resistant     CEFEPIME <=0.12 SENSITIVE Sensitive     CEFTAZIDIME <=1 SENSITIVE Sensitive     CIPROFLOXACIN <=0.25 SENSITIVE Sensitive     GENTAMICIN <=1 SENSITIVE Sensitive     IMIPENEM <=0.25 SENSITIVE Sensitive     TRIMETH/SULFA <=20 SENSITIVE Sensitive     PIP/TAZO <=4 SENSITIVE Sensitive     * FEW ENTEROBACTER CLOACAE   Enterococcus faecalis - MIC*    AMPICILLIN <=2 SENSITIVE Sensitive     VANCOMYCIN 1 SENSITIVE Sensitive     GENTAMICIN SYNERGY SENSITIVE Sensitive     * FEW ENTEROCOCCUS FAECALIS     Time coordinating discharge: Over 30 minutes  SIGNED:   Wyvonnia Dusky, MD  [facility] 05/05/2022, 3:13 PM Pager   If 7PM-7AM, please contact night-coverage [email or web address]

## 2022-05-05 NOTE — TOC Progression Note (Signed)
Transition of Care [facility]) - Progression Note    Patient Details  Name: [name] MRN: [id number] Date of Birth: 1961/06/20  Transition of Care [facility]) CM/SW Contact  Conception Oms, RN Phone Number: 05/05/2022, 3:22 PM  Clinical Narrative:    Tommi Rumps with [facility] has accepted the patient for [facility]        Expected Discharge Plan and Services           Expected Discharge Date: 05/05/22                                     Social Determinants of Health (SDOH) Interventions    Readmission Risk Interventions    05/03/2021    3:02 PM 12/11/2020    9:23 AM 09/21/2020    9:39 AM  Readmission Risk Prevention Plan  Transportation Screening Complete Complete Complete  Medication Review ([place]) Complete Complete Complete  PCP or Specialist appointment within 3-5 days of discharge  Complete Complete  HRI or Home Care Consult  Complete Complete  SW Recovery Care/Counseling Consult  Complete Complete  Palliative Care Screening Complete Not Applicable Complete  [place]  Not Complete Complete  SNF Comments  working on placement

## 2022-05-05 NOTE — TOC Progression Note (Signed)
Transition of Care [facility]) - Progression Note    Patient Details  Name: [name] MRN: [id number] Date of Birth: 11/10/1960  Transition of Care [facility]) CM/SW Contact  Laurena Slimmer, RN Phone Number: 05/05/2022, 12:17 PM  Clinical Narrative:    Call placed to Benita Stabile at [facility] for bed offer. Clinical still being reviewed.         Expected Discharge Plan and Services                                                 Social Determinants of Health (SDOH) Interventions    Readmission Risk Interventions    05/03/2021    3:02 PM 12/11/2020    9:23 AM 09/21/2020    9:39 AM  Readmission Risk Prevention Plan  Transportation Screening Complete Complete Complete  Medication Review Press photographer) Complete Complete Complete  PCP or Specialist appointment within 3-5 days of discharge  Complete Complete  HRI or Home Care Consult  Complete Complete  SW Recovery Care/Counseling Consult  Complete Complete  Palliative Care Screening Complete Not Applicable Complete  [place]  Not Complete Complete  SNF Comments  working on placement

## 2022-05-05 NOTE — TOC Progression Note (Addendum)
Transition of Care [facility]) - Progression Note    Patient Details  Name: [name] MRN: [id number] Date of Birth: 28-Nov-1960  Transition of Care [facility]) CM/SW [place], RN Phone Number: 05/05/2022, 3:04 PM  Clinical Narrative:    Reached out to jason at [facility] and requested to accept for [facility], Awaiting a response, I reached out to meg with [facility] to request that they accept for [facility], awaiting a response I reached out to [facility] and requested that they accept the patient for [facility], awaiting a response Patient has Home Equipment: Conservation officer, nature (2 wheels);Cane - single point;Hospital bed;Wheelchair Probation officer (4 wheels);BSC/3in1;Cane - quad Additional Comments: BSC over toilet        Expected Discharge Plan and Services                                                 Social Determinants of Health (SDOH) Interventions    Readmission Risk Interventions    05/03/2021    3:02 PM 12/11/2020    9:23 AM 09/21/2020    9:39 AM  Readmission Risk Prevention Plan  Transportation Screening Complete Complete Complete  Medication Review Press photographer) Complete Complete Complete  PCP or Specialist appointment within 3-5 days of discharge  Complete Complete  HRI or Home Care Consult  Complete Complete  SW Recovery Care/Counseling Consult  Complete Complete  Palliative Care Screening Complete Not Applicable Complete  [place]  Not Complete Complete  SNF Comments  working on placement

## 2022-05-19 ENCOUNTER — Telehealth (INDEPENDENT_AMBULATORY_CARE_PROVIDER_SITE_OTHER): Payer: Self-pay | Admitting: Vascular Surgery

## 2022-05-19 NOTE — Telephone Encounter (Signed)
Patient called in wanting to know the status of her lymp pump, also if she needed an follow up appt from being in the hospital    Please call and advise

## 2022-05-19 NOTE — Telephone Encounter (Signed)
Patient should follow up with PCP per discharge summary. Patient was informed that we have submitted to [place] and someone will reach out to her to schedule to come out.

## 2022-06-06 ENCOUNTER — Other Ambulatory Visit: Payer: Self-pay

## 2022-06-06 ENCOUNTER — Emergency Department
Admission: EM | Admit: 2022-06-06 | Discharge: 2022-06-06 | Disposition: A | Discharge status: Home / Self Care | Payer: Medicare Other | Attending: Emergency Medicine | Admitting: Emergency Medicine

## 2022-06-06 ENCOUNTER — Emergency Department: Payer: Medicare Other

## 2022-06-06 DIAGNOSIS — M79672 Pain in left foot: Secondary | ICD-10-CM | POA: Insufficient documentation

## 2022-06-06 DIAGNOSIS — Z4800 Encounter for change or removal of nonsurgical wound dressing: Secondary | ICD-10-CM | POA: Diagnosis present

## 2022-06-06 DIAGNOSIS — Z5189 Encounter for other specified aftercare: Secondary | ICD-10-CM

## 2022-06-06 LAB — COMPREHENSIVE METABOLIC PANEL
ALT: 13 U/L (ref 0–44)
AST: 22 U/L (ref 15–41)
Albumin: 3.4 g/dL — ABNORMAL LOW (ref 3.5–5.0)
Alkaline Phosphatase: 75 U/L (ref 38–126)
Anion gap: 9 (ref 5–15)
BUN: 17 mg/dL (ref 8–23)
CO2: 29 mmol/L (ref 22–32)
Calcium: 8.5 mg/dL — ABNORMAL LOW (ref 8.9–10.3)
Chloride: 95 mmol/L — ABNORMAL LOW (ref 98–111)
Creatinine, Ser: 3.82 mg/dL — ABNORMAL HIGH (ref 0.44–1.00)
GFR, Estimated: 13 mL/min — ABNORMAL LOW (ref >60)
Glucose, Bld: 130 mg/dL — ABNORMAL HIGH (ref 70–99)
Potassium: 3.9 mmol/L (ref 3.5–5.1)
Sodium: 133 mmol/L — ABNORMAL LOW (ref 135–145)
Total Bilirubin: 0.4 mg/dL (ref 0.3–1.2)
Total Protein: 6.9 g/dL (ref 6.5–8.1)

## 2022-06-06 LAB — CBC WITH DIFFERENTIAL/PLATELET
Abs Immature Granulocytes: 0.01 10*3/uL (ref 0.00–0.07)
Basophils Absolute: 0 10*3/uL (ref 0.0–0.1)
Basophils Relative: 1 %
Eosinophils Absolute: 0.5 10*3/uL (ref 0.0–0.5)
Eosinophils Relative: 9 %
HCT: 26.9 % — ABNORMAL LOW (ref 36.0–46.0)
Hemoglobin: 8.6 g/dL — ABNORMAL LOW (ref 12.0–15.0)
Immature Granulocytes: 0 %
Lymphocytes Relative: 32 %
Lymphs Abs: 1.6 10*3/uL (ref 0.7–4.0)
MCH: 29.5 pg (ref 26.0–34.0)
MCHC: 32 g/dL (ref 30.0–36.0)
MCV: 92.1 fL (ref 80.0–100.0)
Monocytes Absolute: 0.5 10*3/uL (ref 0.1–1.0)
Monocytes Relative: 11 %
Neutro Abs: 2.4 10*3/uL (ref 1.7–7.7)
Neutrophils Relative %: 47 %
Platelets: 143 10*3/uL — ABNORMAL LOW (ref 150–400)
RBC: 2.92 MIL/uL — ABNORMAL LOW (ref 3.87–5.11)
RDW: 14.4 % (ref 11.5–15.5)
WBC: 5 10*3/uL (ref 4.0–10.5)
nRBC: 0 % (ref 0.0–0.2)

## 2022-06-06 LAB — LACTIC ACID, PLASMA: Lactic Acid, Venous: 1.1 mmol/L (ref 0.5–1.9)

## 2022-06-06 MED ORDER — DOXYCYCLINE MONOHYDRATE 100 MG PO TABS: 100.0000 mg | ORAL_TABLET | Freq: Two times a day (BID) | ORAL | 0 refills | Status: AC

## 2022-06-06 NOTE — Discharge Instructions (Addendum)
Take Doxycycline twice daily for 14 days.  Please keep appointment with podiatry on Monday.

## 2022-06-06 NOTE — ED Triage Notes (Signed)
Pt states she had recent toe surgery and today there was green drainage. Pt is AOX4, NAD noted. Pt c/o throbbing pain from toe to arch of foot, no visible swelling, edema, redness noted at this time.

## 2022-06-06 NOTE — ED Notes (Signed)
E signature pad not working. Pt educated on discharge instructions and verbalized understanding.  

## 2022-06-06 NOTE — ED Provider Notes (Signed)
[facility] Provider Note  Patient Contact: 6:22 PM (approximate)   History   Wound Check (Toe w/ drainage)   HPI  [name] is a 61 y.o. female presents to the emergency department for a wound check and left foot pain.  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  Patient has chronic ulceration along the plantar aspect of the remaining aspect of the left great toe which was divided as early as August 23.  Patient denies fever and chills but states that she has noticed increasing pain over the past 2 days.  She endorses concern for a small amount of exudate that was on her bandage this morning.  She has been applying topical antibiotic.      Physical Exam   Triage Vital Signs: ED Triage Vitals  Enc Vitals Group     BP 06/06/22 1601 (!) 148/44     Pulse Rate 06/06/22 1601 69     Resp 06/06/22 1601 18     Temp 06/06/22 1601 98.2 F (36.8 C)     Temp Source 06/06/22 1601 Oral     SpO2 06/06/22 1601 94 %     Weight --      Height --      Head Circumference --      [place] Flow --      Pain Score 06/06/22 1602 8     Pain Loc --      Pain Edu? --      Excl. in [place]? --     Most recent vital signs: Vitals:   06/06/22 1601  BP: (!) 148/44  Pulse: 69  Resp: 18  Temp: 98.2 F (36.8 C)  SpO2: 94%     General: Alert and in no acute distress. Eyes:  PERRL. EOMI. Head: No acute traumatic findings ENT:      Nose: No congestion/rhinnorhea.      Mouth/Throat: Mucous membranes are moist. Neck: No stridor. No cervical spine tenderness to palpation. Cardiovascular:  Good peripheral perfusion Respiratory: Normal respiratory effort without tachypnea or retractions. Lungs CTAB. Good air entry to the bases with no decreased or absent breath sounds. Gastrointestinal: Bowel sounds 4 quadrants. Soft and nontender to palpation. No guarding or rigidity. No palpable masses. No distention. No CVA tenderness. Musculoskeletal: Full range of  motion to all extremities.  Neurologic:  No gross focal neurologic deficits are appreciated.  Skin: Patient has a 2 cm x 2 cm chronic ulcer on the plantar aspect of the left foot.  No overlying slough.  No surrounding cellulitis. Other:   ED Results / Procedures / Treatments   Labs (all labs ordered are listed, but only abnormal results are displayed) Labs Reviewed  CBC WITH DIFFERENTIAL/PLATELET - Abnormal; Notable for the following components:      Result Value   RBC 2.92 (*)    Hemoglobin 8.6 (*)    HCT 26.9 (*)    Platelets 143 (*)    All other components within normal limits  COMPREHENSIVE METABOLIC PANEL - Abnormal; Notable for the following components:   Sodium 133 (*)    Chloride 95 (*)    Glucose, Bld 130 (*)    Creatinine, Ser 3.82 (*)    Calcium 8.5 (*)    Albumin 3.4 (*)    GFR, Estimated 13 (*)    All other components within normal limits  LACTIC ACID, PLASMA  LACTIC ACID, PLASMA        RADIOLOGY  I personally viewed and evaluated these images as part of my medical decision making, as well as reviewing the written report by the radiologist.  ED Provider Interpretation:  Partial amputation of the first digit with possible resorptive change/osteomyelitis at the cut margin of the first distal phalanx.  PROCEDURES:  Critical Care performed: No  Procedures   MEDICATIONS ORDERED IN ED: Medications - No data to display   IMPRESSION / MDM / [place] / ED COURSE  I reviewed the triage vital signs and the nursing notes.                              Assessment and plan Foot pain 61 year old female presents to the emergency department with left great foot pain and for wound check.  Vital signs are reassuring at triage.  On exam, patient was alert, active and nontoxic-appearing.  Patient had no exudate or surrounding cellulitis around chronic ulceration along the plantar aspect of the left foot.  I reached out to patient's podiatrist, Dr.  Cleda Mccreedy who reviewed pictures of patient's foot.  Also reviewed x-ray findings.  Dr. Cleda Mccreedy plans to see patient as an outpatient on Monday.  He recommended starting patient empirically on antibiotics.  We will start patient on doxycycline twice daily for the next 14 days.     FINAL CLINICAL IMPRESSION(S) / ED DIAGNOSES   Final diagnoses:  Visit for wound check     Rx / DC Orders   ED Discharge Orders          Ordered    doxycycline (ADOXA) 100 MG tablet  2 times daily        06/06/22 1815             Note:  This document was prepared using Dragon voice recognition software and may include unintentional dictation errors.   [name], PA-C 06/06/22 1913    Robin [place], MD 06/07/22 [phone]

## 2022-06-06 NOTE — ED Triage Notes (Signed)
LEFT LARGE TOE wound  , 2 weeks prior had infection ,pain worse , pt axox4, pt lives at home, blood sugar 101, bp 148/53, hr 68 97% RA

## 2022-06-13 ENCOUNTER — Encounter: Admission: RE | Payer: Self-pay | Source: Ambulatory Visit

## 2022-06-13 ENCOUNTER — Ambulatory Visit: Admission: RE | Admit: 2022-06-13 | Payer: Medicare Other | Source: Ambulatory Visit

## 2022-06-13 SURGERY — COLONOSCOPY WITH PROPOFOL
Anesthesia: General

## 2022-06-16 ENCOUNTER — Ambulatory Visit: Payer: Medicare Other | Admitting: Physician Assistant

## 2022-06-17 ENCOUNTER — Encounter
Admission: EM | Disposition: A | Discharge status: Home / Self Care | Payer: Self-pay | Source: Home / Self Care | Attending: Emergency Medicine

## 2022-06-17 ENCOUNTER — Other Ambulatory Visit: Payer: Self-pay

## 2022-06-17 ENCOUNTER — Other Ambulatory Visit (INDEPENDENT_AMBULATORY_CARE_PROVIDER_SITE_OTHER): Payer: Self-pay | Admitting: Nurse Practitioner

## 2022-06-17 ENCOUNTER — Ambulatory Visit
Admission: EM | Admit: 2022-06-17 | Discharge: 2022-06-17 | Disposition: A | Discharge status: Home / Self Care | Payer: Medicare Other | Attending: Emergency Medicine | Admitting: Emergency Medicine

## 2022-06-17 DIAGNOSIS — Y841 Kidney dialysis as the cause of abnormal reaction of the patient, or of later complication, without mention of misadventure at the time of the procedure: Secondary | ICD-10-CM | POA: Diagnosis not present

## 2022-06-17 DIAGNOSIS — T8249XA Other complication of vascular dialysis catheter, initial encounter: Secondary | ICD-10-CM | POA: Insufficient documentation

## 2022-06-17 DIAGNOSIS — T82898A Other specified complication of vascular prosthetic devices, implants and grafts, initial encounter: Secondary | ICD-10-CM

## 2022-06-17 DIAGNOSIS — Z992 Dependence on renal dialysis: Secondary | ICD-10-CM

## 2022-06-17 DIAGNOSIS — N186 End stage renal disease: Secondary | ICD-10-CM | POA: Diagnosis not present

## 2022-06-17 HISTORY — PX: DIALYSIS/PERMA CATHETER INSERTION: CATH118288

## 2022-06-17 LAB — GLUCOSE, CAPILLARY: Glucose-Capillary: 86 mg/dL (ref 70–99)

## 2022-06-17 SURGERY — DIALYSIS/PERMA CATHETER INSERTION

## 2022-06-17 MED ORDER — CEFAZOLIN SODIUM-DEXTROSE 1-4 GM/50ML-% IV SOLN
INTRAVENOUS | Status: AC
  Administered 2022-06-17: 1 g via INTRAVENOUS
  Filled 2022-06-17: qty 50

## 2022-06-17 MED ORDER — CEFAZOLIN SODIUM-DEXTROSE 1-4 GM/50ML-% IV SOLN: 1.0000 g | INTRAVENOUS | Status: AC

## 2022-06-17 MED ORDER — SODIUM CHLORIDE 0.9 % IV SOLN: INTRAVENOUS | Status: DC

## 2022-06-17 MED ORDER — HEPARIN SODIUM (PORCINE) 10000 UNIT/ML IJ SOLN
INTRAMUSCULAR | Status: AC
  Filled 2022-06-17: qty 1

## 2022-06-17 MED ORDER — DIPHENHYDRAMINE HCL 50 MG/ML IJ SOLN
INTRAMUSCULAR | Status: AC
  Filled 2022-06-17: qty 1

## 2022-06-17 MED ORDER — MIDAZOLAM HCL 2 MG/2ML IJ SOLN
INTRAMUSCULAR | Status: DC | PRN
  Administered 2022-06-17: 2 mg via INTRAVENOUS

## 2022-06-17 MED ORDER — FENTANYL CITRATE (PF) 100 MCG/2ML IJ SOLN
INTRAMUSCULAR | Status: AC
  Filled 2022-06-17: qty 2

## 2022-06-17 MED ORDER — MIDAZOLAM HCL 5 MG/5ML IJ SOLN
INTRAMUSCULAR | Status: AC
  Filled 2022-06-17: qty 5

## 2022-06-17 MED ORDER — DIPHENHYDRAMINE HCL 50 MG/ML IJ SOLN
INTRAMUSCULAR | Status: DC | PRN
  Administered 2022-06-17: 25 mg via INTRAVENOUS

## 2022-06-17 MED ORDER — LIDOCAINE-EPINEPHRINE (PF) 2 %-1:200000 IJ SOLN
INTRAMUSCULAR | Status: AC
  Filled 2022-06-17: qty 20

## 2022-06-17 MED ORDER — HYDRALAZINE HCL 50 MG PO TABS
100.0000 mg | ORAL_TABLET | Freq: Once | ORAL | Status: AC
  Administered 2022-06-17: 50 mg via ORAL
  Filled 2022-06-17: qty 2

## 2022-06-17 MED ORDER — FENTANYL CITRATE (PF) 100 MCG/2ML IJ SOLN
INTRAMUSCULAR | Status: DC | PRN
  Administered 2022-06-17: 25 ug via INTRAVENOUS

## 2022-06-17 SURGICAL SUPPLY — 5 items
BIOPATCH RED 1 DISK 7.0 (GAUZE/BANDAGES/DRESSINGS) IMPLANT
CATH PALINDROME-P 19CM W/VT (CATHETERS) IMPLANT
GUIDEWIRE SUPER STIFF .035X180 (WIRE) IMPLANT
SUT MNCRL AB 4-0 PS2 18 (SUTURE) IMPLANT
SUT SILK 0 FSL (SUTURE) IMPLANT

## 2022-06-17 NOTE — ED Triage Notes (Signed)
Arrives via [facility] from [place] [facility].  Patient had bleeding from AV fistula. Bleeding currently controlled.  Complete treatment today. VS wnl.

## 2022-06-17 NOTE — Op Note (Signed)
[facility]   OPERATIVE NOTE     PROCEDURE: 1. Exchange right IJ tunneled dialysis catheter over wire same access   PRE-OPERATIVE DIAGNOSIS: Complication of dialysis device with nonfunction of tunneled catheter; end-stage renal requiring hemodialysis  POST-OPERATIVE DIAGNOSIS: same as above  SURGEON: Katha Cabal, M.D.  ANESTHESIA: Conscious sedation was administered under my direct supervision by the interventional radiology RN.  IV Versed plus fentanyl were utilized. Continuous ECG, pulse oximetry and blood pressure was monitored throughout the entire procedure.  Conscious sedation was for a total of 30 minutes and 13 seconds.  ESTIMATED BLOOD LOSS: Minimal  FINDING(S): 1.  Tips of the catheter in the right atrium on fluoroscopy 2.  No obvious pneumothorax on fluoroscopy  SPECIMEN(S):  none  INDICATIONS:   [name] is a 61 y.o. female  presents with end stage renal disease.  Therefore, the patient requires a tunneled dialysis catheter placement.  The patient is informed of  the risks catheter placement include but are not limited to: bleeding, infection, central venous injury, pneumothorax, possible venous stenosis, possible malpositioning in the venous system, and possible infections related to long-term catheter presence.  The patient was aware of these risks and agreed to proceed.  DESCRIPTION: The patient was taken back to Special Procedure suite.  Prior to sedation, the patient was given IV antibiotics.  After obtaining adequate sedation, the patient was prepped and draped in the standard fashion for a chest or neck tunneled dialysis catheter placement.  Appropriate Time Out is called.   The the right neck and chest wall are then infiltrated with 1% Lidocaine with epinepherine.  A 19 cm tip to cuff catheter is then selected, opened on the back table and prepped.  The cuff of the existing catheter is localized.  Using both blunt and sharp  dissection the cuff is then freed from surrounding attachments. The catheter is then controlled with a hemostat and transected above the level of the cuff.  Under fluoroscopy an Amplatz Super Stiff wire is introduced through the catheter and negotiated into the inferior vena cava. The remaining portion of the catheter is then removed without difficulty.  A new catheter is then threaded over the wire and advanced under fluoroscopy and positioned so that the catheter tip is in the proximal atrium.  Each port was tested by aspirating and flushing.  No resistance was noted.  Each port was then thoroughly flushed with heparinized saline.  The catheter was secured in placed with two interrupted stitches of 0 silk tied to the catheter.   Each port was then packed with concentrated heparin (10,000 Units/mL) at the manufacturer recommended volumes to each port.  Sterile caps were applied to each port.  On completion fluoroscopy, the tips of the catheter were in the right atrium, and there was no evidence of pneumothorax.  COMPLICATIONS: None  CONDITION: [name] 06/17/2022,5:14 PM  vein and vascular Office: [phone]   06/17/2022, 5:14 PM

## 2022-06-17 NOTE — ED Notes (Signed)
Attempted to update son, per pt request, he did not answer. Pt resting in stretcher with eyes closed with no needs. Agreeable to plan of having her permacath replaced. Will monitor.

## 2022-06-17 NOTE — ED Provider Notes (Signed)
[facility] Provider Note    Event Date/Time   First MD Initiated Contact with Patient 06/17/22 1133     (approximate)   History   Vascular Access Problem   HPI  [name] is a 61 y.o. female with past medical history of end-stage renal disease who gets dialyzed Tuesday Thursday Saturday comes in because of an issue with her dialysis catheter.  She went to dialysis today and had a full session however the clamp on the right tubing broke off and thus they sent her to the emergency department.  Currently there was some bleeding around the catheter as well which is stopped.  She was able to get a full dialysis session.  Did miss her prior dialysis session because of diarrhea which is now resolved.     Past Medical History:  Diagnosis Date   Anemia    Anginal pain (HCC)    Anxiety    Arthritis    Asthma    Broken wrist    Bronchitis    chronic diastolic CHF 1/61/0960   Chronic kidney disease    COPD (chronic obstructive pulmonary disease) ([place])    Coronary artery disease    a. cath 2013: stenting to RCA (report not available); b. cath 2014: LM nl, pLAD 40%, mLAD nl, ost LCx 40%, mid LCx nl, pRCA 30% @ site of prior stent, mRCA 50%   Depression    Diabetes mellitus ([place])    Diabetes mellitus without complication ([place])    Diabetic neuropathy ([place])    dialysis 2006   Diverticulosis    Dizziness    Dyspnea    Elevated lipids    Environmental and seasonal allergies    ESRD (end stage renal disease) on dialysis ([place])    M-W-F   Gastroparesis    GERD (gastroesophageal reflux disease)    Headache    History of anemia due to chronic kidney disease    History of hiatal hernia    HOH (hard of hearing)    Hx of pancreatitis 2015   Hypertension    Lower extremity edema    Mitral regurgitation    a. echo 10/2013: EF 62%, noWMA, mildly dilated LA, mild to mod MR/TR, GR1DD   Myocardial infarction ([place])    Orthopnea    Parathyroid abnormality ([place])     Peripheral arterial disease ([place])    Pneumonia    Renal cancer ([place])    Renal insufficiency    Pt is on dialysis on M,W + F.   Wheezing     Patient Active Problem List   Diagnosis Date Noted   Pericardial effusion 12/05/2021   Hearing impairment 12/04/2021   Hyponatremia 11/27/2021   COVID-19 virus infection 11/27/2021   Bacterial pneumonia 11/27/2021   Acute GI bleeding 11/15/2021   Upper GI bleed 11/15/2021   Anemia of chronic kidney failure 05/03/2021   Thrombocytopenia ([place]) 05/03/2021   Severe sepsis with septic shock ([place]) 04/25/2021   HCAP (healthcare-associated pneumonia) 45/40/9811   Acute metabolic encephalopathy 91/47/8295   UTI (urinary tract infection) 03/29/2021   Chronic respiratory failure with hypoxia ([place]) 12/18/2020   Superior vena caval syndrome 12/14/2020   Chronic diastolic CHF (congestive heart failure) ([place]) 12/04/2020   Bradycardia 09/17/2020   Advanced care planning/counseling discussion    Discitis of thoracic region    Fluid overload 08/10/2020   HLD (hyperlipidemia) 08/10/2020   Chest pain 08/10/2020   CAD (coronary artery disease) 08/10/2020   GERD (gastroesophageal reflux disease) 08/10/2020  Obesity (BMI 30-39.9) 06/06/2020   Anemia in ESRD (end-stage renal disease) ([place]) 05/31/2020   Type 1 diabetes mellitus ([place]) 05/31/2020   End-stage renal disease ([place]) 05/31/2020   Hypertensive disorder 05/31/2020   Arm pain 05/21/2020   Cellulitis of right leg 05/02/2020   ESRD (end stage renal disease) ([place]) 04/13/2020   SOBOE (shortness of breath on exertion) 02/08/2020   Hematoma of arm, left, subsequent encounter 11/24/2019   Respiratory failure (HCC)    Shortness of breath    Complication of arteriovenous dialysis fistula 11/13/2019   PVD (peripheral vascular disease) ([place]) 11/13/2019   Severe anemia 11/13/2019   Pressure ulcer, stage 2 ([place]) 11/13/2019   Hemorrhagic shock ([place]) 11/11/2019   Leg pain 10/02/2019   CAP (community acquired  pneumonia) 10/01/2019   Atherosclerosis of native arteries of extremity with intermittent claudication ([place]) 05/01/2019   Right-sided headache    COPD (chronic obstructive pulmonary disease) ([place]) 10/06/2018   Syncope 10/04/2018   Symptomatic anemia 06/18/2018   Gastroparesis due to DM ([place]) 14/97/0263   Complication of vascular access for dialysis 12/04/2017   Osteomyelitis ([place]) 09/30/2017   Carotid stenosis 06/18/2017   Bilateral carotid artery stenosis 06/16/2017   Benign essential HTN 05/19/2017   CKD (chronic kidney disease) stage 5, GFR less than 15 ml/min (HCC) 05/19/2017   Hyperlipidemia, mixed 05/19/2017   Shortness of breath 05/04/2017   Rash, skin 12/16/2016   Cellulitis of lower extremity 07/29/2016   Chronic venous insufficiency 07/29/2016   Lymphedema 07/29/2016   TIA (transient ischemic attack) 04/21/2016   Altered mental status 04/08/2016   Hyperammonemia ([place]) 04/08/2016   Elevated troponin 04/08/2016   Depression 04/08/2016   Depression, major, recurrent, severe with psychosis ([place]) 04/08/2016   Blood in stool    Intractable cyclical vomiting with nausea    Reflux esophagitis    Gastritis    Generalized abdominal pain    Uncontrollable vomiting    Major depressive disorder, recurrent episode, moderate ([place]) 03/15/2016   Adjustment disorder with mixed anxiety and depressed mood 03/15/2016   Malnutrition of moderate degree 12/01/2015   Renal mass    Dyspnea    Acute renal failure ([place])    Respiratory failure ([place])    Fever 11/14/2015   Encounter for central line placement    Encounter for orogastric (OG) tube placement    Nausea 11/12/2015   Hyperkalemia 10/03/2015   Diarrhea, unspecified 07/22/2015   Hypoglycemia 04/24/2015   Unresponsiveness 04/24/2015   Sinus bradycardia 04/24/2015   Hypothermia 04/24/2015   Acute hypoxemic respiratory failure ([place]) 04/24/2015   Acute on chronic diastolic CHF (congestive heart failure) ([place]) 04/05/2015   Diabetic  gastroparesis ([place]) 04/05/2015   Hypokalemia 04/05/2015   Physical debility 04/05/2015   Acute pulmonary edema ([place]) 04/03/2015   Nausea and vomiting 04/03/2015   Hypoglycemia associated with diabetes ([place]) 04/03/2015   Anemia of chronic disease 04/03/2015   Secondary hyperparathyroidism ([place]) 04/03/2015   Pressure ulcer 04/02/2015   Acute respiratory failure with hypoxia ([place]) 04/01/2015   Adjustment disorder with anxiety 03/14/2015   Somatic symptom disorder, mild 03/08/2015   Coronary artery disease involving native coronary artery of native heart without angina pectoris    Nausea & vomiting 03/06/2015   Abdominal pain 03/06/2015   Diabetes mellitus, type II ([place]) 03/06/2015   HTN (hypertension) 03/06/2015   Gastroparesis 02/24/2015   Pleural effusion 02/19/2015   Bacteremia due to Enterococcus 02/19/2015   End-stage renal disease on hemodialysis ([place]) 02/19/2015   Diarrhea 08/01/2014  Physical Exam  Triage Vital Signs: ED Triage Vitals  Enc Vitals Group     BP 06/17/22 1140 (!) 157/48     Pulse Rate 06/17/22 1140 (!) 59     Resp 06/17/22 1140 20     Temp --      Temp src --      SpO2 06/17/22 1140 96 %     Weight 06/17/22 1134 169 lb 8.5 oz (76.9 kg)     Height 06/17/22 1134 '5\' 2"'$  (1.575 m)     Head Circumference --      Peak Flow --      Pain Score 06/17/22 1134 10     Pain Loc --      Pain Edu? --      Excl. in [place]? --     Most recent vital signs: Vitals:   06/17/22 1140  BP: (!) 157/48  Pulse: (!) 59  Resp: 20  SpO2: 96%     General: Awake, no distress.  CV:  Good peripheral perfusion.  Resp:  Normal effort.  Abd:  No distention.  Neuro:             Awake, Alert, Oriented x 3  Other:  Right tubing of the dialysis PermCath has broken clamp blue tubing looks normal there is no bleeding from the skin entry site no surrounding erythema   ED Results / Procedures / Treatments  Labs (all labs ordered are listed, but only abnormal results are  displayed) Labs Reviewed - No data to display   EKG    RADIOLOGY    PROCEDURES:  Critical Care performed: No  Procedures   MEDICATIONS ORDERED IN ED: Medications - No data to display   IMPRESSION / MDM / [place] / ED COURSE  I reviewed the triage vital signs and the nursing notes.                              Patient presents from dialysis because her PermCath clamp broke.  She did have a full dialysis session today.  The red tubing clamp broke off.  She tells me there was some bleeding around it as well but this is resolved.  There does not appear to be any infection.  I have discussed with NP Arna Medici from vascular and vein who evaluated the catheter and says that they would like to exchange it.  Patient will be able to be discharged afterward.      FINAL CLINICAL IMPRESSION(S) / ED DIAGNOSES   Final diagnoses:  Problem with dialysis access, initial encounter [facility])     Rx / DC Orders   ED Discharge Orders     None        Note:  This document was prepared using Dragon voice recognition software and may include unintentional dictation errors.   Rada Hay, MD 06/17/22 1302

## 2022-06-17 NOTE — ED Triage Notes (Signed)
Pt here with a bleeding catheter. Pt also has multiple complaints of blisters on her leg and other pain all over her body. Pt states catheter site is painful to touch.

## 2022-06-18 ENCOUNTER — Encounter: Payer: Self-pay | Admitting: Vascular Surgery

## 2022-06-18 LAB — GLUCOSE, CAPILLARY: Glucose-Capillary: 74 mg/dL (ref 70–99)

## 2022-06-24 ENCOUNTER — Emergency Department: Payer: Medicare Other

## 2022-06-24 ENCOUNTER — Emergency Department
Admission: EM | Admit: 2022-06-24 | Discharge: 2022-06-24 | Disposition: A | Discharge status: Home / Self Care | Payer: Medicare Other | Attending: Emergency Medicine | Admitting: Emergency Medicine

## 2022-06-24 ENCOUNTER — Ambulatory Visit (INDEPENDENT_AMBULATORY_CARE_PROVIDER_SITE_OTHER): Payer: Medicare Other | Admitting: Nurse Practitioner

## 2022-06-24 ENCOUNTER — Encounter: Payer: Self-pay | Admitting: Emergency Medicine

## 2022-06-24 ENCOUNTER — Other Ambulatory Visit: Payer: Self-pay

## 2022-06-24 DIAGNOSIS — W01190A Fall on same level from slipping, tripping and stumbling with subsequent striking against furniture, initial encounter: Secondary | ICD-10-CM | POA: Insufficient documentation

## 2022-06-24 DIAGNOSIS — R0789 Other chest pain: Secondary | ICD-10-CM | POA: Insufficient documentation

## 2022-06-24 DIAGNOSIS — S81811A Laceration without foreign body, right lower leg, initial encounter: Secondary | ICD-10-CM | POA: Insufficient documentation

## 2022-06-24 DIAGNOSIS — N186 End stage renal disease: Secondary | ICD-10-CM | POA: Insufficient documentation

## 2022-06-24 DIAGNOSIS — Y92009 Unspecified place in unspecified non-institutional (private) residence as the place of occurrence of the external cause: Secondary | ICD-10-CM | POA: Diagnosis not present

## 2022-06-24 DIAGNOSIS — J449 Chronic obstructive pulmonary disease, unspecified: Secondary | ICD-10-CM | POA: Diagnosis not present

## 2022-06-24 DIAGNOSIS — S8000XA Contusion of unspecified knee, initial encounter: Secondary | ICD-10-CM | POA: Insufficient documentation

## 2022-06-24 DIAGNOSIS — W19XXXA Unspecified fall, initial encounter: Secondary | ICD-10-CM

## 2022-06-24 DIAGNOSIS — Z992 Dependence on renal dialysis: Secondary | ICD-10-CM | POA: Diagnosis not present

## 2022-06-24 DIAGNOSIS — M25511 Pain in right shoulder: Secondary | ICD-10-CM | POA: Diagnosis not present

## 2022-06-24 DIAGNOSIS — E1122 Type 2 diabetes mellitus with diabetic chronic kidney disease: Secondary | ICD-10-CM | POA: Insufficient documentation

## 2022-06-24 MED ORDER — HYDROCODONE-ACETAMINOPHEN 5-325 MG PO TABS
2.0000 | ORAL_TABLET | Freq: Once | ORAL | Status: AC
  Administered 2022-06-24: 2 via ORAL
  Filled 2022-06-24: qty 2

## 2022-06-24 NOTE — ED Triage Notes (Signed)
Presents from home via EMS s/p fall  States she was trying to get up and her walker moved  She fell  Hitting the coffee table  Laceration to right lower ext .  Also having some discomfort in right side of chest   states she recently had new port placed

## 2022-06-24 NOTE — ED Notes (Signed)
Right lower ext  cleansed with saline  Dressing applied   Pt tolerated well

## 2022-06-24 NOTE — Progress Notes (Signed)
I did not order this test.

## 2022-06-24 NOTE — ED Provider Notes (Signed)
[facility] Provider Note    Event Date/Time   First MD Initiated Contact with Patient 06/24/22 [phone]     (approximate)  History   Chief Complaint: Fall and Laceration  HPI  [name] is a 61 y.o. female with a past medical history of anemia, COPD, ESRD on HD, diabetes, presents to the emergency department after a fall.  According to the patient she was reaching to get her walker when she slipped forward hitting her coffee table.  Patient states she hit her right chest on the coffee table as well as her right leg on the floor.  Patient has a small skin tear to her right shin that is hemostatic.  Patient is complaining of pain in her right lateral chest and right shoulder.  Denies hitting her head.  Denies LOC.  Physical Exam   Triage Vital Signs: ED Triage Vitals [06/24/22 0732]  Enc Vitals Group     BP (!) 169/63     Pulse Rate 63     Resp 20     Temp 97.6 F (36.4 C)     Temp Source Oral     SpO2 99 %     Weight 169 lb 8.5 oz (76.9 kg)     Height '5\' 2"'$  (1.575 m)     Head Circumference      Peak Flow      Pain Score 7     Pain Loc      Pain Edu?      Excl. in [place]?     Most recent vital signs: Vitals:   06/24/22 0732  BP: (!) 169/63  Pulse: 63  Resp: 20  Temp: 97.6 F (36.4 C)  SpO2: 99%    General: Awake, no distress.  CV:  Good peripheral perfusion.  Regular rate and rhythm  Resp:  Normal effort.  Equal breath sounds bilaterally.  Moderate right lateral chest wall tenderness to palpation moderate right shoulder tenderness. Abd:  No distention.  Soft, nontender.  No rebound or guarding. Other:  Patient has a skin tear to the right lower extremity that is now hemostatic.  Right upper extremity tenderness to palpation but neurovascular intact distally.  Good range of motion in the wrist and elbow.   ED Results / Procedures / Treatments   RADIOLOGY  I have reviewed and interpreted the shoulder x-ray images.  I do not see any obvious  fractures on my evaluation. Radiology is read the x-ray is negative CT scan of the chest does not appear to show any acute traumatic findings.   MEDICATIONS ORDERED IN ED: Medications  HYDROcodone-acetaminophen (NORCO/VICODIN) 5-325 MG per tablet 2 tablet (has no administration in time range)     IMPRESSION / MDM / ASSESSMENT AND PLAN / ED COURSE  I reviewed the triage vital signs and the nursing notes.  Patient's presentation is most consistent with acute presentation with potential threat to life or bodily function.  Patient presents to the emergency department after mechanical fall while attempting to get her walker.  Patient is complaining of right lateral chest wall pain as well as right shoulder pain.  We will obtain x-ray images of the shoulder we will obtain a CT without contrast of the chest to rule out rib fracture or lung injury.  We will dress the skin tear with Xeroform.  Patient agreeable to plan.  Patient states she takes Norco at home for pain relief and is requesting pain medication we will dose 2 tablets  of Norco in the emergency department.  Shoulder x-ray negative for fracture.  CT scan negative for traumatic finding.  Slight amount of groundglass opacities consistent pulmonary edema, patient has a history of end-stage renal disease on dialysis.  Soft tissue stranding around the dialysis catheter, patient confirms recent placement.  Pain is much better controlled after Norco.  Patient states she takes Norco at home.  We will discharge patient with PCP follow-up.  FINAL CLINICAL IMPRESSION(S) / ED DIAGNOSES   Fall Skin tear Contusion   Note:  This document was prepared using Dragon voice recognition software and may include unintentional dictation errors.   Harvest Dark, MD 06/24/22 [phone]

## 2022-06-30 ENCOUNTER — Encounter (INDEPENDENT_AMBULATORY_CARE_PROVIDER_SITE_OTHER): Payer: Self-pay | Admitting: Vascular Surgery

## 2022-06-30 ENCOUNTER — Ambulatory Visit (INDEPENDENT_AMBULATORY_CARE_PROVIDER_SITE_OTHER): Payer: Medicare Other | Admitting: Vascular Surgery

## 2022-06-30 VITALS — BP 161/75 | HR 64 | Resp 16

## 2022-06-30 DIAGNOSIS — E1159 Type 2 diabetes mellitus with other circulatory complications: Secondary | ICD-10-CM

## 2022-06-30 DIAGNOSIS — N186 End stage renal disease: Secondary | ICD-10-CM

## 2022-06-30 DIAGNOSIS — I10 Essential (primary) hypertension: Secondary | ICD-10-CM

## 2022-06-30 DIAGNOSIS — I83013 Varicose veins of right lower extremity with ulcer of ankle: Secondary | ICD-10-CM | POA: Diagnosis not present

## 2022-06-30 DIAGNOSIS — I872 Venous insufficiency (chronic) (peripheral): Secondary | ICD-10-CM

## 2022-06-30 DIAGNOSIS — I2510 Atherosclerotic heart disease of native coronary artery without angina pectoris: Secondary | ICD-10-CM

## 2022-06-30 DIAGNOSIS — L97319 Non-pressure chronic ulcer of right ankle with unspecified severity: Secondary | ICD-10-CM

## 2022-06-30 NOTE — Progress Notes (Addendum)
MRN : [id number]  [name] is a 61 y.o. (08-18-1961) female who presents with chief complaint of legs swell.  History of Present Illness:   The patient returns to the office for followup evaluation regarding leg swelling.  The swelling has persisted and the pain associated with swelling continues. She notes the pain is severe and constant.  There has been interval development of an ulceration of the right shin / ankle.  Since the previous visit the patient has been wearing graduated compression stockings and has noted little if any improvement in the lymphedema. The patient has been using compression routinely morning until night.  The patient also states elevation during the day and exercise is being done too but this is not helping at all.   She has been doing compression, exercise and and elevation for 12 weeks.  As a second issue the patient recently had her tunneled catheter changed.  She stated there isn't any drainage.  The stitch is hurting her.  Catheter has been functioning well.  No fever or chills.  Current Meds  Medication Sig   acetaminophen (TYLENOL) 325 MG tablet Take 2 tablets (650 mg total) by mouth every 6 (six) hours as needed for mild pain, headache, moderate pain or fever (fever >/= 101).   albuterol (VENTOLIN HFA) 108 (90 Base) MCG/ACT inhaler Inhale 2 puffs into the lungs every 4 (four) hours as needed for wheezing or shortness of breath.   alum & mag hydroxide-simeth (MAALOX/MYLANTA) 200-200-20 MG/5ML suspension Take 15 mLs by mouth every 6 (six) hours as needed for indigestion or heartburn.   amLODipine (NORVASC) 10 MG tablet Take 1 tablet (10 mg total) by mouth daily.   ammonium lactate (LAC-HYDRIN) 12 % lotion Apply 1 application topically 2 (two) times daily as needed for dry skin.    aspirin EC 81 MG tablet Take 81 mg by mouth daily.   atorvastatin (LIPITOR) 20 MG tablet Take 20 mg by mouth every evening.    Cholecalciferol (VITAMIN D3) 25 MCG (1000  UT) CAPS Take 1,000 Units by mouth daily.    CREON 36000-114000 units CPEP capsule Take 36,000 Units by mouth. Take 2 capsules with meals and 1 capsule with snacks   cyanocobalamin 1000 MCG tablet Take 1,000 mcg by mouth daily.    epoetin alfa (EPOGEN) 10000 UNIT/ML injection Inject 1 mL (10,000 Units total) into the vein every Monday, Wednesday, and Friday with hemodialysis.   Ferrous Sulfate (IRON) 325 (65 Fe) MG TABS Take 325 mg by mouth daily.    fluticasone (FLONASE) 50 MCG/ACT nasal spray Place 2 sprays into both nostrils daily.   gabapentin (NEURONTIN) 100 MG capsule Take 100 mg by mouth 3 (three) times daily.   hydrALAZINE (APRESOLINE) 100 MG tablet Take 0.5 tablets (50 mg total) by mouth 2 (two) times daily.   HYDROcodone-acetaminophen (NORCO/VICODIN) 5-325 MG tablet Take 1 tablet by mouth as directed.   lidocaine (LIDODERM) 5 % Place 1 patch onto the skin daily. (Remove after 12 hours -- leave off for 12 hours before applying new patch)   meclizine (ANTIVERT) 25 MG tablet Take 25 mg by mouth every 6 (six) hours as needed for dizziness.   mirtazapine (REMERON) 7.5 MG tablet Take 7.5 mg by mouth daily.   multivitamin (RENA-VIT) TABS tablet Take 1 tablet by mouth daily.    mupirocin ointment (BACTROBAN) 2 % Apply 1 application topically daily as needed (leg rash).    Nutritional Supplements (FEEDING SUPPLEMENT, NEPRO CARB STEADY,) LIQD  Take 237 mLs by mouth 2 (two) times daily between meals.   pantoprazole (PROTONIX) 40 MG tablet Take 40 mg by mouth daily.   polyethylene glycol (MIRALAX / GLYCOLAX) 17 g packet Take 17 g by mouth daily as needed. (Patient taking differently: Take 17 g by mouth daily as needed for mild constipation or moderate constipation.)   Probiotic Product (PROBIOTIC PO) Take 1 capsule by mouth daily.   promethazine (PHENERGAN) 12.5 MG tablet Take 1 tablet (12.5 mg total) by mouth every 6 (six) hours as needed for nausea or vomiting.   sevelamer carbonate (RENVELA) 800  MG tablet Take 800 mg by mouth 4 (four) times daily. (Take with meals and snack)    Past Medical History:  Diagnosis Date   Anemia    Anginal pain (HCC)    Anxiety    Arthritis    Asthma    Broken wrist    Bronchitis    chronic diastolic CHF 03/31/6268   Chronic kidney disease    COPD (chronic obstructive pulmonary disease) ([place])    Coronary artery disease    a. cath 2013: stenting to RCA (report not available); b. cath 2014: LM nl, pLAD 40%, mLAD nl, ost LCx 40%, mid LCx nl, pRCA 30% @ site of prior stent, mRCA 50%   Depression    Diabetes mellitus ([place])    Diabetes mellitus without complication ([place])    Diabetic neuropathy ([place])    dialysis 2006   Diverticulosis    Dizziness    Dyspnea    Elevated lipids    Environmental and seasonal allergies    ESRD (end stage renal disease) on dialysis ([place])    M-W-F   Gastroparesis    GERD (gastroesophageal reflux disease)    Headache    History of anemia due to chronic kidney disease    History of hiatal hernia    HOH (hard of hearing)    Hx of pancreatitis 2015   Hypertension    Lower extremity edema    Mitral regurgitation    a. echo 10/2013: EF 62%, noWMA, mildly dilated LA, mild to mod MR/TR, GR1DD   Myocardial infarction ([place])    Orthopnea    Parathyroid abnormality ([place])    Peripheral arterial disease ([place])    Pneumonia    Renal cancer ([place])    Renal insufficiency    Pt is on dialysis on M,W + F.   Wheezing     Past Surgical History:  Procedure Laterality Date   A/V SHUNTOGRAM Left 01/20/2018   Procedure: A/V SHUNTOGRAM;  Surgeon: Algernon Huxley, MD;  Location: [place] [facility];  Service: Cardiovascular;  Laterality: Left;   ABDOMINAL HYSTERECTOMY  1992   AMPUTATION TOE Left 10/02/2017   Procedure: AMPUTATION TOE-LEFT GREAT TOE;  Surgeon: Albertine Patricia, DPM;  Location: [facility];  Service: Podiatry;  Laterality: Left;   APPENDECTOMY     APPLICATION OF WOUND VAC N/A 11/25/2019   Procedure: APPLICATION OF WOUND  VAC;  Surgeon: Katha Cabal, MD;  Location: [facility];  Service: Vascular;  Laterality: N/A;   ARTERY BIOPSY Right 10/11/2018   Procedure: BIOPSY TEMPORAL ARTERY;  Surgeon: Vickie Epley, MD;  Location: [facility];  Service: General;  Laterality: Right;   BREAST CYST EXCISION     CARDIAC CATHETERIZATION Left 07/26/2015   Procedure: Left Heart Cath and Coronary Angiography;  Surgeon: Dionisio David, MD;  Location: [place] [facility];  Service: Cardiovascular;  Laterality: Left;   CATARACT EXTRACTION W/ INTRAOCULAR  LENS IMPLANT Right    CATARACT EXTRACTION W/PHACO Left 03/10/2017   Procedure: CATARACT EXTRACTION PHACO AND INTRAOCULAR LENS PLACEMENT ([place]);  Surgeon: Birder Robson, MD;  Location: [facility];  Service: Ophthalmology;  Laterality: Left;  [place] 00:51.9 AP% 14.2 CDE 7.39 fluid pack lot # [id number]   CENTRAL LINE INSERTION Right 11/11/2019   Procedure: CENTRAL LINE INSERTION;  Surgeon: Katha Cabal, MD;  Location: [facility];  Service: Vascular;  Laterality: Right;   CENTRAL LINE INSERTION  11/25/2019   Procedure: CENTRAL LINE INSERTION;  Surgeon: Katha Cabal, MD;  Location: [facility];  Service: Vascular;;   CHOLECYSTECTOMY     COLONOSCOPY WITH PROPOFOL N/A 08/12/2016   Procedure: COLONOSCOPY WITH PROPOFOL;  Surgeon: Lollie Sails, MD;  Location: [facility];  Service: Endoscopy;  Laterality: N/A;   DIALYSIS FISTULA CREATION Left    upper arm   dialysis grafts     DIALYSIS/PERMA CATHETER INSERTION N/A 11/14/2019   Procedure: DIALYSIS/PERMA CATHETER INSERTION;  Surgeon: Algernon Huxley, MD;  Location: [place] [facility];  Service: Cardiovascular;  Laterality: N/A;   DIALYSIS/PERMA CATHETER INSERTION N/A 02/03/2020   Procedure: DIALYSIS/PERMA CATHETER INSERTION;  Surgeon: Katha Cabal, MD;  Location: [place] [facility];  Service: Cardiovascular;  Laterality: N/A;   DIALYSIS/PERMA CATHETER INSERTION N/A 06/19/2020   Procedure: DIALYSIS/PERMA CATHETER  INSERTION;  Surgeon: Katha Cabal, MD;  Location: [place] [facility];  Service: Cardiovascular;  Laterality: N/A;   DIALYSIS/PERMA CATHETER INSERTION N/A 05/07/2021   Procedure: DIALYSIS/PERMA CATHETER INSERTION;  Surgeon: Katha Cabal, MD;  Location: [place] [facility];  Service: Cardiovascular;  Laterality: N/A;  Patient needs general anethesia   DIALYSIS/PERMA CATHETER INSERTION N/A 06/17/2022   Procedure: DIALYSIS/PERMA CATHETER INSERTION;  Surgeon: Katha Cabal, MD;  Location: [place] [facility];  Service: Cardiovascular;  Laterality: N/A;   DIALYSIS/PERMA CATHETER REMOVAL N/A 05/25/2020   Procedure: DIALYSIS/PERMA CATHETER REMOVAL;  Surgeon: Katha Cabal, MD;  Location: [place] [facility];  Service: Cardiovascular;  Laterality: N/A;   ESOPHAGOGASTRODUODENOSCOPY N/A 03/08/2015   Procedure: ESOPHAGOGASTRODUODENOSCOPY (EGD);  Surgeon: Manya Silvas, MD;  Location: [facility];  Service: Endoscopy;  Laterality: N/A;   ESOPHAGOGASTRODUODENOSCOPY (EGD) WITH PROPOFOL N/A 03/18/2016   Procedure: ESOPHAGOGASTRODUODENOSCOPY (EGD) WITH PROPOFOL;  Surgeon: [name], MD;  Location: [facility];  Service: Endoscopy;  Laterality: N/A;   EYE SURGERY Right 2018   FECAL TRANSPLANT N/A 08/23/2015   Procedure: FECAL TRANSPLANT;  Surgeon: Manya Silvas, MD;  Location: [facility];  Service: Endoscopy;  Laterality: N/A;   HAND SURGERY Bilateral    HEMATOMA EVACUATION Left 11/25/2019   Procedure: EVACUATION HEMATOMA;  Surgeon: Katha Cabal, MD;  Location: [facility];  Service: Vascular;  Laterality: Left;   I & D EXTREMITY Left 11/25/2019   Procedure: IRRIGATION AND DEBRIDEMENT EXTREMITY;  Surgeon: Katha Cabal, MD;  Location: [facility];  Service: Vascular;  Laterality: Left;   IR FLUORO GUIDE CV LINE RIGHT  04/06/2020   IR INJECT/THERA/INC NEEDLE/CATH/PLC EPI/CERV/THOR W/IMG  08/13/2020   IR RADIOLOGIST EVAL & MGMT  07/28/2019   IR RADIOLOGIST EVAL &  MGMT  08/11/2019   LIGATION OF ARTERIOVENOUS  FISTULA Left 11/11/2019   LIGATION OF ARTERIOVENOUS  FISTULA Left 11/11/2019   Procedure: LIGATION OF ARTERIOVENOUS  FISTULA;  Surgeon: Katha Cabal, MD;  Location: [facility];  Service: Vascular;  Laterality: Left;   LIGATIONS OF HERO GRAFT Right 06/13/2020   Procedure: LIGATION / REMOVAL OF RIGHT HERO GRAFT;  Surgeon:  Laguana Desautel, Dolores Lory, MD;  Location: [facility];  Service: Vascular;  Laterality: Right;   PERIPHERAL VASCULAR CATHETERIZATION N/A 12/20/2015   Procedure: Thrombectomy of dialysis access versus permcath placement;  Surgeon: Algernon Huxley, MD;  Location: [place] [facility];  Service: Cardiovascular;  Laterality: N/A;   PERIPHERAL VASCULAR CATHETERIZATION N/A 12/20/2015   Procedure: A/V Shunt Intervention;  Surgeon: Algernon Huxley, MD;  Location: [place] [facility];  Service: Cardiovascular;  Laterality: N/A;   PERIPHERAL VASCULAR CATHETERIZATION N/A 12/20/2015   Procedure: A/V Shuntogram/Fistulagram;  Surgeon: Algernon Huxley, MD;  Location: [place] [facility];  Service: Cardiovascular;  Laterality: N/A;   PERIPHERAL VASCULAR CATHETERIZATION N/A 01/02/2016   Procedure: A/V Shuntogram/Fistulagram;  Surgeon: Algernon Huxley, MD;  Location: [place] [facility];  Service: Cardiovascular;  Laterality: N/A;   PERIPHERAL VASCULAR CATHETERIZATION N/A 01/02/2016   Procedure: A/V Shunt Intervention;  Surgeon: Algernon Huxley, MD;  Location: [place] [facility];  Service: Cardiovascular;  Laterality: N/A;   TEE WITHOUT CARDIOVERSION N/A 06/11/2020   Procedure: TRANSESOPHAGEAL ECHOCARDIOGRAM (TEE);  Surgeon: Teodoro Spray, MD;  Location: [facility];  Service: Cardiovascular;  Laterality: N/A;   UPPER EXTREMITY ANGIOGRAPHY Bilateral 11/27/2020   Procedure: UPPER EXTREMITY ANGIOGRAPHY;  Surgeon: Katha Cabal, MD;  Location: [place] [facility];  Service: Cardiovascular;  Laterality: Bilateral;   UPPER EXTREMITY VENOGRAPHY Right 01/18/2020   Procedure:  UPPER EXTREMITY VENOGRAPHY;  Surgeon: Katha Cabal, MD;  Location: [place] [facility];  Service: Cardiovascular;  Laterality: Right;   UPPER EXTREMITY VENOGRAPHY Bilateral 11/27/2020   Procedure: UPPER EXTREMITY VENOGRAPHY;  Surgeon: Katha Cabal, MD;  Location: [place] [facility];  Service: Cardiovascular;  Laterality: Bilateral;   UPPER EXTREMITY VENOGRAPHY Left 11/05/2021   Procedure: UPPER EXTREMITY VENOGRAPHY;  Surgeon: Katha Cabal, MD;  Location: [place] [facility];  Service: Cardiovascular;  Laterality: Left;   VASCULAR ACCESS DEVICE INSERTION Right 04/13/2020   Procedure: INSERTION OF HERO VASCULAR ACCESS DEVICE (GRAFT);  Surgeon: Katha Cabal, MD;  Location: [facility];  Service: Vascular;  Laterality: Right;    Social History Social History   Tobacco Use   Smoking status: Never   Smokeless tobacco: Never  Vaping Use   Vaping Use: Never used  Substance Use Topics   Alcohol use: Not Currently    Comment: glass wine week per pt   Drug use: Yes    Types: Marijuana    Comment: once a day    Family History Family History  Problem Relation Age of Onset   Kidney disease Mother    Diabetes Mother    Cancer Father    Kidney disease Sister     Allergies  Allergen Reactions   Ace Inhibitors Swelling and Anaphylaxis   Ativan [Lorazepam] Other (See Comments)    Reaction:  Hallucinations and headaches   Compazine [Prochlorperazine Edisylate] Anaphylaxis, Nausea And Vomiting and Other (See Comments)    Other reaction(s): dystonia from this vs. Reglan, 23 Jul - patient relates that she takes promethazine frequently with no problems   Sumatriptan Succinate Other (See Comments)    Other reaction(s): delirium and hallucinations per [facility] records   Zofran [Ondansetron] Nausea And Vomiting    Per pt. she is allergic to zofran or will experience adverse reaction like hallucination    Losartan Nausea Only   Prochlorperazine Other (See Comments)     Reaction:  Unknown . Patient does not remember reaction but she does have vertigo and anxiety along  with n and v at times. Could be used to treat any of these    Reglan [Metoclopramide] Other (See Comments)    Per patient her Dr. Evelina Bucy her off it    Scopolamine Other (See Comments)    Dizziness, also has vertigo already   Tape Rash    Plastic tape causes rash   Tapentadol Rash   Ultrasound Gel Itching    Patient states the Ultrasound gel caused itching while in skin contact and resolved when wiped away.     REVIEW OF SYSTEMS (Negative unless checked)  Constitutional: '[]'$ Weight loss  '[]'$ Fever  '[]'$ Chills Cardiac: '[]'$ Chest pain   '[]'$ Chest pressure   '[]'$ Palpitations   '[]'$ Shortness of breath when laying flat   '[]'$ Shortness of breath with exertion. Vascular:  '[]'$ Pain in legs with walking   '[x]'$ Pain in legs with standing  '[]'$ History of DVT   '[]'$ Phlebitis   '[x]'$ Swelling in legs   '[]'$ Varicose veins   '[]'$ Non-healing ulcers Pulmonary:   '[]'$ Uses home oxygen   '[]'$ Productive cough   '[]'$ Hemoptysis   '[]'$ Wheeze  '[]'$ COPD   '[]'$ Asthma Neurologic:  '[]'$ Dizziness   '[]'$ Seizures   '[]'$ History of stroke   '[]'$ History of TIA  '[]'$ Aphasia   '[]'$ Vissual changes   '[]'$ Weakness or numbness in arm   '[]'$ Weakness or numbness in leg Musculoskeletal:   '[]'$ Joint swelling   '[]'$ Joint pain   '[]'$ Low back pain Hematologic:  '[]'$ Easy bruising  '[]'$ Easy bleeding   '[]'$ Hypercoagulable state   '[]'$ Anemic Gastrointestinal:  '[]'$ Diarrhea   '[]'$ Vomiting  '[]'$ Gastroesophageal reflux/heartburn   '[]'$ Difficulty swallowing. Genitourinary:  '[]'$ Chronic kidney disease   '[]'$ Difficult urination  '[]'$ Frequent urination   '[]'$ Blood in urine Skin:  '[]'$ Rashes   '[]'$ Ulcers  Psychological:  '[]'$ History of anxiety   '[]'$  History of major depression.  Physical Examination  Vitals:   06/30/22 1447  BP: (!) 161/75  Pulse: 64  Resp: 16   There is no height or weight on file to calculate BMI. Gen: WD/WN, NAD Head: [place]/AT, No temporalis wasting.  Ear/Nose/Throat: Hearing grossly intact, nares w/o erythema or  drainage, pinna without lesions Eyes: PER, EOMI, sclera nonicteric.  Neck: Supple, no gross masses.  No JVD.  Pulmonary:  Good air movement, no audible wheezing, no use of accessory muscles.  Cardiac: RRR, precordium not hyperdynamic. Vascular:    Severe venous stasis changes to the legs bilaterally.  Her skin is wooden and discolored with severe cobblestoning.  4+ soft pitting edema.  There is a open wound weeping straw colored fluid noninfected right leg. Vessel Right Left  Radial Palpable Palpable  Gastrointestinal: soft, non-distended. No guarding/no peritoneal signs.  Musculoskeletal: M/S 5/5 throughout.  No deformity.  Neurologic: CN 2-12 intact. Pain and light touch intact in extremities.  Symmetrical.  Speech is fluent. Motor exam as listed above. Psychiatric: Judgment intact, Mood & affect appropriate for pt's clinical situation. Dermatologic: Venous rashes no ulcers noted.  No changes consistent with cellulitis. Lymph : No lichenification or skin changes of chronic lymphedema.  CBC Lab Results  Component Value Date   WBC 5.0 06/06/2022   HGB 8.6 (L) 06/06/2022   HCT 26.9 (L) 06/06/2022   MCV 92.1 06/06/2022   PLT 143 (L) 06/06/2022    BMET    Component Value Date/Time   NA 133 (L) 06/06/2022 1607   NA 135 (L) 09/15/2014 0948   K 3.9 06/06/2022 1607   K 4.8 09/15/2014 0948   CL 95 (L) 06/06/2022 1607   CL 99 09/15/2014 0948   CO2 29 06/06/2022 1607   CO2 26  09/15/2014 0948   GLUCOSE 130 (H) 06/06/2022 1607   GLUCOSE 118 (H) 09/15/2014 0948   BUN 17 06/06/2022 1607   BUN 19 (H) 09/15/2014 0948   CREATININE 3.82 (H) 06/06/2022 1607   CREATININE 6.79 (H) 09/15/2014 0948   CALCIUM 8.5 (L) 06/06/2022 1607   CALCIUM 8.3 (L) 09/15/2014 0948   GFRNONAA 13 (L) 06/06/2022 1607   GFRNONAA 7 (L) 09/15/2014 0948   GFRNONAA 6 (L) 05/31/2014 0432   GFRAA 11 (L) 06/25/2020 0641   GFRAA 8 (L) 09/15/2014 0948   GFRAA 7 (L) 05/31/2014 0432   CrCl cannot be calculated  (Patient's most recent lab result is older than the maximum 21 days allowed.).  COAG Lab Results  Component Value Date   INR 1.0 04/28/2022   INR 1.2 04/25/2021   INR 1.3 (H) 03/29/2021    Radiology CT CHEST WO CONTRAST  Result Date: 06/24/2022 CLINICAL DATA:  Chest trauma from fall EXAM: CT CHEST WITHOUT CONTRAST TECHNIQUE: Multidetector CT imaging of the chest was performed following the standard protocol without IV contrast. RADIATION DOSE REDUCTION: This exam was performed according to the departmental dose-optimization program which includes automated exposure control, adjustment of the mA and/or kV according to patient size and/or use of iterative reconstruction technique. COMPARISON:  CT chest, abdomen and pelvis dated August 7th 2023 FINDINGS: Cardiovascular: Normal heart size. No pericardial effusion. Severe left main and three-vessel coronary artery calcifications. Normal caliber thoracic aorta with moderate calcified plaque. Right chest wall Vas-Cath with tip in the lower right atrium. Mediastinum/Nodes: Patulous esophagus. Thyroid is unremarkable. Enlarged mediastinal lymph nodes, slightly increased in size when compared with prior exam. Reference prevascular lymph node measuring 1.2 cm in short axis on series 2, image 52, previously 0.8 cm. Lungs/Pleura: Central airways are patent.Trace right-greater-than-left pleural effusions. Bibasilar atelectasis. Mild diffuse ground-glass opacities and interlobular septal thickening. No consolidation or pneumothorax. Upper Abdomen: Atrophic bilateral kidneys. Simple appearing partially visualized left renal cysts, no specific follow-up imaging is recommended. Musculoskeletal: Soft tissue stranding seen in the subcutaneous tissues about the right chest wall dialysis catheter.No fracture is seen. IMPRESSION: 1. No acute traumatic findings seen in the chest. 2. Low mild diffuse ground-glass opacities and interlobular septal thickening, compatible with  pulmonary edema. 3. Trace right-greater-than-left pleural effusions. 4. Enlarged mediastinal lymph nodes, slightly increased in size when compared with prior exam, likely reactive. 5. Soft tissue stranding seen in the subcutaneous tissues about the right chest wall dialysis catheter, possibly related to recent placement. Recommend clinical correlation. 6. Aortic Atherosclerosis (ICD10-I70.0). Electronically Signed   By: Yetta Glassman M.D.   On: 06/24/2022 08:23   DG Shoulder Right  Result Date: 06/24/2022 CLINICAL DATA:  [place] this morning and hit coffee table, RIGHT chest discomfort EXAM: RIGHT SHOULDER - 2+ VIEW COMPARISON:  03/30/2021 FINDINGS: Osseous demineralization. AC joint alignment normal. No acute fracture, dislocation, or bone destruction. Vascular stent proximal RIGHT upper arm. RIGHT jugular dialysis catheter. IMPRESSION: No acute osseous abnormalities. Electronically Signed   By: Lavonia Dana M.D.   On: 06/24/2022 08:22   PERIPHERAL VASCULAR CATHETERIZATION  Result Date: 06/17/2022 See surgical note for result.  DG Foot Complete Left  Result Date: 06/06/2022 CLINICAL DATA:  Total drainage EXAM: LEFT FOOT - COMPLETE 3+ VIEW COMPARISON:  09/29/2017 FINDINGS: Vascular calcifications. No acute fracture. Soft tissue swelling without emphysema. Ulcer along the plantar aspect of the first digit stump. Status post amputation of the first digit. Question subtle resorptive change at the distal aspect of first proximal phalanx on  one view. IMPRESSION: 1. Status post partial amputation of first digit. Question subtle resorptive change/osteomyelitis at the cut margin of the first distal phalanx; suggest correlation with MRI Electronically Signed   By: Donavan Foil M.D.   On: 06/06/2022 17:53     Assessment/Plan 1. ESRD (end stage renal disease) ([place]) Recommend:  The patient is doing well and currently has adequate dialysis access. The patient's dialysis center is not reporting any access  issues. Flow pattern is stable when compared to the prior ultrasound.  The patient should have a duplex ultrasound of the dialysis access in 3 months. The patient will follow-up with me in the office after each ultrasound     2. Venous ulcer of ankle, right (HCC) Recommend:  No surgery or intervention at this point in time.    I have reviewed my discussion with the patient regarding lymphedema and why it  causes symptoms.  Patient will continue wearing graduated compression on a daily basis. The patient should put the compression on first thing in the morning and removing them in the evening. The patient should not sleep in the compression.   In addition, behavioral modification throughout the day will be continued.  This will include frequent elevation (such as in a recliner), use of over the counter pain medications as needed and exercise such as walking.  She has been doing compression, exercise and and elevation for 12 weeks.  Severe venous stasis changes to the legs bilaterally.  Her skin is wooden and discolored with severe cobblestoning.  4+ soft pitting edema.  There is a open wound weeping straw colored fluid noninfected right leg.  The patient has been using compression and elevating and exercising for the past 24 months.  That has not eliminated her symptoms.  She is CEAP [id number]  The systemic causes for chronic edema such as liver, kidney and cardiac etiologies do not appear to have significant changed over the past year.    Despite conservative treatments including graduated compression therapy class 1 and behavioral modification including exercise and elevation the patient  has not obtained adequate control of the lymphedema.  The patient still has stage 3 lymphedema and therefore, I believe that a lymph pump should be added to improve the control of the patient's lymphedema.  Additionally, a lymph pump is warranted because it will reduce the risk of cellulitis and ulceration  in the future.  Patient should follow-up in 3 months    3. Lymphedema/Chronic venous insufficiency Recommend:  No surgery or intervention at this point in time.    I have reviewed my discussion with the patient regarding lymphedema and why it  causes symptoms.  Patient will continue wearing graduated compression on a daily basis. The patient should put the compression on first thing in the morning and removing them in the evening. The patient should not sleep in the compression.   In addition, behavioral modification throughout the day will be continued.  This will include frequent elevation (such as in a recliner), use of over the counter pain medications as needed and exercise such as walking.  She has been doing compression, exercise and and elevation for 12 weeks.  Severe venous stasis changes to the legs bilaterally.  Her skin is wooden and discolored with severe cobblestoning.  4+ soft pitting edema.  There is a open wound weeping straw colored fluid noninfected right leg.  The patient has been using compression and elevating and exercising for the past 24 months.  That has not eliminated  her symptoms.  She is CEAP [id number]  The systemic causes for chronic edema such as liver, kidney and cardiac etiologies do not appear to have significant changed over the past year.    Despite conservative treatments including graduated compression therapy class 1 and behavioral modification including exercise and elevation the patient  has not obtained adequate control of the lymphedema.  The patient still has stage 3 lymphedema and therefore, I believe that a lymph pump should be added to improve the control of the patient's lymphedema.  Additionally, a lymph pump is warranted because it will reduce the risk of cellulitis and ulceration in the future.  Patient should follow-up in 3 months    4. Primary hypertension Continue antihypertensive medications as already ordered, these medications have  been reviewed and there are no changes at this time.   5. Coronary artery disease involving native coronary artery of native heart without angina pectoris Continue cardiac and antihypertensive medications as already ordered and reviewed, no changes at this time.  Continue statin as ordered and reviewed, no changes at this time  Nitrates PRN for chest pain   6. Type 2 diabetes mellitus with other circulatory complication, unspecified whether long term insulin use (HCC) Continue hypoglycemic medications as already ordered, these medications have been reviewed and there are no changes at this time.  Hgb A1C to be monitored as already arranged by primary service     Hortencia Pilar, MD  06/30/2022 3:37 PM

## 2022-07-01 ENCOUNTER — Emergency Department: Payer: Medicare Other

## 2022-07-01 ENCOUNTER — Inpatient Hospital Stay
Admission: EM | Admit: 2022-07-01 | Discharge: 2022-07-08 | DRG: 391 | Disposition: A | Discharge status: Skilled Nursing Facility | Payer: Medicare Other | Attending: Internal Medicine | Admitting: Internal Medicine

## 2022-07-01 ENCOUNTER — Encounter: Payer: Self-pay | Admitting: Emergency Medicine

## 2022-07-01 DIAGNOSIS — E1151 Type 2 diabetes mellitus with diabetic peripheral angiopathy without gangrene: Secondary | ICD-10-CM | POA: Diagnosis present

## 2022-07-01 DIAGNOSIS — Z7982 Long term (current) use of aspirin: Secondary | ICD-10-CM

## 2022-07-01 DIAGNOSIS — Z79899 Other long term (current) drug therapy: Secondary | ICD-10-CM

## 2022-07-01 DIAGNOSIS — K219 Gastro-esophageal reflux disease without esophagitis: Secondary | ICD-10-CM | POA: Diagnosis present

## 2022-07-01 DIAGNOSIS — A419 Sepsis, unspecified organism: Secondary | ICD-10-CM

## 2022-07-01 DIAGNOSIS — E11621 Type 2 diabetes mellitus with foot ulcer: Secondary | ICD-10-CM | POA: Diagnosis present

## 2022-07-01 DIAGNOSIS — Z89412 Acquired absence of left great toe: Secondary | ICD-10-CM

## 2022-07-01 DIAGNOSIS — K3184 Gastroparesis: Secondary | ICD-10-CM | POA: Diagnosis present

## 2022-07-01 DIAGNOSIS — Z941 Heart transplant status: Secondary | ICD-10-CM

## 2022-07-01 DIAGNOSIS — E876 Hypokalemia: Secondary | ICD-10-CM | POA: Diagnosis not present

## 2022-07-01 DIAGNOSIS — L89899 Pressure ulcer of other site, unspecified stage: Secondary | ICD-10-CM | POA: Diagnosis present

## 2022-07-01 DIAGNOSIS — Z992 Dependence on renal dialysis: Secondary | ICD-10-CM

## 2022-07-01 DIAGNOSIS — Z91048 Other nonmedicinal substance allergy status: Secondary | ICD-10-CM

## 2022-07-01 DIAGNOSIS — R5381 Other malaise: Secondary | ICD-10-CM

## 2022-07-01 DIAGNOSIS — J9611 Chronic respiratory failure with hypoxia: Secondary | ICD-10-CM | POA: Diagnosis present

## 2022-07-01 DIAGNOSIS — T679XXA Effect of heat and light, unspecified, initial encounter: Principal | ICD-10-CM

## 2022-07-01 DIAGNOSIS — E1143 Type 2 diabetes mellitus with diabetic autonomic (poly)neuropathy: Secondary | ICD-10-CM | POA: Diagnosis present

## 2022-07-01 DIAGNOSIS — N186 End stage renal disease: Secondary | ICD-10-CM | POA: Diagnosis present

## 2022-07-01 DIAGNOSIS — E1122 Type 2 diabetes mellitus with diabetic chronic kidney disease: Secondary | ICD-10-CM | POA: Diagnosis present

## 2022-07-01 DIAGNOSIS — N2581 Secondary hyperparathyroidism of renal origin: Secondary | ICD-10-CM | POA: Diagnosis present

## 2022-07-01 DIAGNOSIS — K529 Noninfective gastroenteritis and colitis, unspecified: Secondary | ICD-10-CM | POA: Diagnosis not present

## 2022-07-01 DIAGNOSIS — L89326 Pressure-induced deep tissue damage of left buttock: Secondary | ICD-10-CM | POA: Diagnosis present

## 2022-07-01 DIAGNOSIS — Z1152 Encounter for screening for COVID-19: Secondary | ICD-10-CM

## 2022-07-01 DIAGNOSIS — Z841 Family history of disorders of kidney and ureter: Secondary | ICD-10-CM

## 2022-07-01 DIAGNOSIS — I132 Hypertensive heart and chronic kidney disease with heart failure and with stage 5 chronic kidney disease, or end stage renal disease: Secondary | ICD-10-CM | POA: Diagnosis present

## 2022-07-01 DIAGNOSIS — Z833 Family history of diabetes mellitus: Secondary | ICD-10-CM

## 2022-07-01 DIAGNOSIS — J4489 Other specified chronic obstructive pulmonary disease: Secondary | ICD-10-CM | POA: Diagnosis present

## 2022-07-01 DIAGNOSIS — L97529 Non-pressure chronic ulcer of other part of left foot with unspecified severity: Secondary | ICD-10-CM | POA: Diagnosis present

## 2022-07-01 DIAGNOSIS — L89316 Pressure-induced deep tissue damage of right buttock: Secondary | ICD-10-CM | POA: Diagnosis present

## 2022-07-01 DIAGNOSIS — I340 Nonrheumatic mitral (valve) insufficiency: Secondary | ICD-10-CM | POA: Diagnosis present

## 2022-07-01 DIAGNOSIS — Z85528 Personal history of other malignant neoplasm of kidney: Secondary | ICD-10-CM

## 2022-07-01 DIAGNOSIS — I2510 Atherosclerotic heart disease of native coronary artery without angina pectoris: Secondary | ICD-10-CM | POA: Diagnosis present

## 2022-07-01 DIAGNOSIS — I10 Essential (primary) hypertension: Secondary | ICD-10-CM | POA: Diagnosis present

## 2022-07-01 DIAGNOSIS — R509 Fever, unspecified: Secondary | ICD-10-CM

## 2022-07-01 DIAGNOSIS — I5032 Chronic diastolic (congestive) heart failure: Secondary | ICD-10-CM | POA: Diagnosis present

## 2022-07-01 DIAGNOSIS — I252 Old myocardial infarction: Secondary | ICD-10-CM

## 2022-07-01 DIAGNOSIS — E11649 Type 2 diabetes mellitus with hypoglycemia without coma: Secondary | ICD-10-CM | POA: Diagnosis present

## 2022-07-01 DIAGNOSIS — Z888 Allergy status to other drugs, medicaments and biological substances status: Secondary | ICD-10-CM

## 2022-07-01 DIAGNOSIS — D631 Anemia in chronic kidney disease: Secondary | ICD-10-CM | POA: Diagnosis present

## 2022-07-01 LAB — CBC WITH DIFFERENTIAL/PLATELET
Abs Immature Granulocytes: 0.03 10*3/uL (ref 0.00–0.07)
Basophils Absolute: 0.1 10*3/uL (ref 0.0–0.1)
Basophils Relative: 1 %
Eosinophils Absolute: 0.8 10*3/uL — ABNORMAL HIGH (ref 0.0–0.5)
Eosinophils Relative: 8 %
HCT: 29 % — ABNORMAL LOW (ref 36.0–46.0)
Hemoglobin: 9.2 g/dL — ABNORMAL LOW (ref 12.0–15.0)
Immature Granulocytes: 0 %
Lymphocytes Relative: 15 %
Lymphs Abs: 1.4 10*3/uL (ref 0.7–4.0)
MCH: 28.6 pg (ref 26.0–34.0)
MCHC: 31.7 g/dL (ref 30.0–36.0)
MCV: 90.1 fL (ref 80.0–100.0)
Monocytes Absolute: 0.5 10*3/uL (ref 0.1–1.0)
Monocytes Relative: 6 %
Neutro Abs: 6.5 10*3/uL (ref 1.7–7.7)
Neutrophils Relative %: 70 %
Platelets: 95 10*3/uL — ABNORMAL LOW (ref 150–400)
RBC: 3.22 MIL/uL — ABNORMAL LOW (ref 3.87–5.11)
RDW: 14.7 % (ref 11.5–15.5)
WBC: 9.3 10*3/uL (ref 4.0–10.5)
nRBC: 0 % (ref 0.0–0.2)

## 2022-07-01 LAB — COMPREHENSIVE METABOLIC PANEL
ALT: 7 U/L (ref 0–44)
AST: 33 U/L (ref 15–41)
Albumin: 3 g/dL — ABNORMAL LOW (ref 3.5–5.0)
Alkaline Phosphatase: 70 U/L (ref 38–126)
Anion gap: 13 (ref 5–15)
BUN: 14 mg/dL (ref 8–23)
CO2: 28 mmol/L (ref 22–32)
Calcium: 7.9 mg/dL — ABNORMAL LOW (ref 8.9–10.3)
Chloride: 96 mmol/L — ABNORMAL LOW (ref 98–111)
Creatinine, Ser: 4.33 mg/dL — ABNORMAL HIGH (ref 0.44–1.00)
GFR, Estimated: 11 mL/min — ABNORMAL LOW (ref >60)
Glucose, Bld: 45 mg/dL — ABNORMAL LOW (ref 70–99)
Potassium: 4.7 mmol/L (ref 3.5–5.1)
Sodium: 137 mmol/L (ref 135–145)
Total Bilirubin: 1 mg/dL (ref 0.3–1.2)
Total Protein: 6.5 g/dL (ref 6.5–8.1)

## 2022-07-01 LAB — TROPONIN I (HIGH SENSITIVITY): Troponin I (High Sensitivity): 25 ng/L — ABNORMAL HIGH (ref <18)

## 2022-07-01 LAB — CK: Total CK: 63 U/L (ref 38–234)

## 2022-07-01 LAB — LIPASE, BLOOD: Lipase: 23 U/L (ref 11–51)

## 2022-07-01 LAB — CBG MONITORING, ED: Glucose-Capillary: 82 mg/dL (ref 70–99)

## 2022-07-01 LAB — LACTIC ACID, PLASMA: Lactic Acid, Venous: 1.2 mmol/L (ref 0.5–1.9)

## 2022-07-01 MED ORDER — SODIUM CHLORIDE 0.9 % IV SOLN
2.0000 g | Freq: Once | INTRAVENOUS | Status: AC
  Administered 2022-07-02: 2 g via INTRAVENOUS
  Filled 2022-07-01: qty 20

## 2022-07-01 MED ORDER — METRONIDAZOLE 500 MG/100ML IV SOLN
500.0000 mg | Freq: Once | INTRAVENOUS | Status: AC
  Administered 2022-07-02: 500 mg via INTRAVENOUS
  Filled 2022-07-01: qty 100

## 2022-07-01 MED ORDER — ACETAMINOPHEN 500 MG PO TABS
1000.0000 mg | ORAL_TABLET | Freq: Once | ORAL | Status: AC
  Administered 2022-07-02: 1000 mg via ORAL
  Filled 2022-07-01: qty 2

## 2022-07-01 MED ORDER — SODIUM CHLORIDE 0.9 % IV BOLUS (SEPSIS): 1000.0000 mL | Freq: Once | INTRAVENOUS | Status: DC

## 2022-07-01 MED ORDER — SODIUM CHLORIDE 0.9 % IV BOLUS (SEPSIS)
1000.0000 mL | Freq: Once | INTRAVENOUS | Status: AC
  Administered 2022-07-02: 1000 mL via INTRAVENOUS

## 2022-07-01 MED ORDER — IOHEXOL 300 MG/ML  SOLN
100.0000 mL | Freq: Once | INTRAMUSCULAR | Status: AC | PRN
  Administered 2022-07-01: 100 mL via INTRAVENOUS

## 2022-07-01 MED ORDER — SODIUM CHLORIDE 0.9 % IV BOLUS (SEPSIS): 500.0000 mL | Freq: Once | INTRAVENOUS | Status: DC

## 2022-07-01 NOTE — ED Provider Notes (Incomplete)
-----------------------------------------   11:52 PM on 07/01/2022 -----------------------------------------   Patient spiked temperature 102.3 F.  CT demonstrates colitis.  Will initiate ED code sepsis protocol and broad-spectrum IV antibiotics.  Will consult Hospitalist services for admission.

## 2022-07-01 NOTE — Consult Note (Signed)
CODE SEPSIS - PHARMACY COMMUNICATION  **Broad Spectrum Antibiotics should be administered within 1 hour of Sepsis diagnosis**  Time Code Sepsis Called/Page Received: 1151  Antibiotics Ordered: 0000  Time of 1st antibiotic administration: 0024  Additional action taken by pharmacy: N/A  If necessary, Name of Provider/Nurse Contacted: N/A  Lorna Dibble ,PharmD Clinical Pharmacist  07/01/2022  11:53 PM

## 2022-07-01 NOTE — ED Triage Notes (Signed)
Pt arrived via [facility] from home where pt was found by grandson with furnace turned up, multiple space heaters on, oven on with door open and a heating blanket on. Per pts family, pt had dialysis today and has been at her home with heat exposure for unknown length of time. Pt with oral temp by EMS 103F. Pt alert and oriented but drowsy on arrival. Pt sts she was cold. Pt denies SI. Rectal temp of 101.20F obtained on arrival to ED. Pts clothing removed and light sheet placed over.    Pt wears 2.5L oxygen chronically

## 2022-07-01 NOTE — ED Provider Notes (Signed)
[facility] Provider Note    Event Date/Time   First MD Initiated Contact with Patient 07/01/22 2030     (approximate)   History   Chief Complaint Heat Exposure   HPI  [name] is a 61 y.o. female with past medical history of hypertension, diabetes, CAD, CHF, COPD on 2 L nasal cannula, anemia, and ESRD on HD (TTS) who presents to the ED for heat exposure.  Per EMS, patient had returned home from her usual dialysis appointment today when her son went to check on her.  He found her house to be extremely hot with the heat turned all the way up, multiple space heaters going, the oven open and set to 500 degrees, and the patient underneath an electric blanket.  He was concerned that the patient seemed more confused than usual, subsequently called EMS.  EMS now reports that patient is alert and oriented, she denies any complaints other than feeling cold.  She states only that she had the heat going because she was feeling cold, denies any intent to harm herself.  Son states that she does often feel cold after dialysis, but has never done something like this before.  Patient states her abdomen is "sore," but denies any other complaints.     Physical Exam   Triage Vital Signs: ED Triage Vitals  Enc Vitals Group     BP      Pulse      Resp      Temp      Temp src      SpO2      Weight      Height      Head Circumference      Peak Flow      Pain Score      Pain Loc      Pain Edu?      Excl. in [place]?     Most recent vital signs: Vitals:   07/01/22 2210 07/01/22 2228  BP:  (!) 166/58  Pulse:  88  Resp:  20  Temp: 100.2 F (37.9 C)   SpO2:  100%    Constitutional: Alert and oriented to person, place, time, and situation. Eyes: Conjunctivae are normal. Head: Atraumatic. Nose: No congestion/rhinnorhea. Mouth/Throat: Mucous membranes are moist.  Cardiovascular: Normal rate, regular rhythm. Grossly normal heart sounds.  2+ radial pulses bilaterally.   Right IJ TDC site clean, dry, and intact. Respiratory: Normal respiratory effort.  No retractions. Lungs CTAB. Gastrointestinal: Soft and diffusely tender to palpation with no rebound or guarding. No distention. Musculoskeletal: No lower extremity tenderness nor edema.  Healing ulceration to base of left great toe with no erythema, warmth, or drainage.  Healing wound to right shin with no erythema or drainage. Neurologic:  Normal speech and language. No gross focal neurologic deficits are appreciated.    ED Results / Procedures / Treatments   Labs (all labs ordered are listed, but only abnormal results are displayed) Labs Reviewed  COMPREHENSIVE METABOLIC PANEL - Abnormal; Notable for the following components:      Result Value   Chloride 96 (*)    Glucose, Bld 45 (*)    Creatinine, Ser 4.33 (*)    Calcium 7.9 (*)    Albumin 3.0 (*)    GFR, Estimated 11 (*)    All other components within normal limits  CBC WITH DIFFERENTIAL/PLATELET - Abnormal; Notable for the following components:   RBC 3.22 (*)    Hemoglobin 9.2 (*)  HCT 29.0 (*)    Platelets 95 (*)    Eosinophils Absolute 0.8 (*)    All other components within normal limits  TROPONIN I (HIGH SENSITIVITY) - Abnormal; Notable for the following components:   Troponin I (High Sensitivity) 25 (*)    All other components within normal limits  CULTURE, BLOOD (ROUTINE X 2)  CULTURE, BLOOD (ROUTINE X 2)  LIPASE, BLOOD  CK  LACTIC ACID, PLASMA  LACTIC ACID, PLASMA  URINALYSIS, ROUTINE W REFLEX MICROSCOPIC  PROTIME-INR  PROCALCITONIN  BLOOD GAS, ARTERIAL  COOXEMETRY PANEL   RADIOLOGY CT head reviewed and interpreted by me with no hemorrhage or midline shift.  Chest x-ray reviewed and interpreted by me with no infiltrate, edema, or effusion.  PROCEDURES:  Critical Care performed: No  Procedures   MEDICATIONS ORDERED IN ED: Medications  iohexol (OMNIPAQUE) 300 MG/ML solution 100 mL (100 mLs Intravenous Contrast Given  07/01/22 2311)     IMPRESSION / MDM / ASSESSMENT AND PLAN / ED COURSE  I reviewed the triage vital signs and the nursing notes.                              62 y.o. female with past medical history of hypertension, diabetes, CAD, CHF, COPD on 2 L, anemia, and ESRD on HD (TTS) who presents to the ED for heat exposure at home after she intentionally left up with an open with multiple space heaters.  Patient's presentation is most consistent with acute presentation with potential threat to life or bodily function.  Differential diagnosis includes, but is not limited to, heatstroke, heat exhaustion, electrolyte abnormality, rhabdomyolysis, sepsis, pneumonia, intracranial process.  Patient nontoxic-appearing and in no acute distress, vital signs remarkable for elevated rectal temp at 101.6 degrees Fahrenheit and but otherwise reassuring.  She does not have any symptoms of infection and suspect fever is due to heat exposure, ice packs were placed on the patient.  She does have some abdominal tenderness and we will check CT of her abdomen/pelvis.  CT head negative for acute process, chest x-ray also unremarkable.  Patient makes minimal amount of urine and I have low suspicion for UTI at this time.  CBC shows stable chronic anemia with no significant leukocytosis, BMP consistent with known ESRD, no significant electrolyte abnormality noted.  CK level within normal limits, troponin mildly elevated consistent with known CKD.  Low suspicion for sepsis, lactic acid within normal limits.  Patient does have mild hyperglycemia with glucose of 45, will give something to eat and recheck.  Patient turned over to oncoming provider pending CT results and reassessment.      FINAL CLINICAL IMPRESSION(S) / ED DIAGNOSES   Final diagnoses:  Heat exposure, initial encounter     Rx / DC Orders   ED Discharge Orders     None        Note:  This document was prepared using Dragon voice recognition software  and may include unintentional dictation errors.   Blake Divine, MD 07/01/22 [phone]

## 2022-07-01 NOTE — ED Provider Notes (Signed)
-----------------------------------------   11:52 PM on 07/01/2022 -----------------------------------------   Patient spiked temperature 102.3 F.  CT demonstrates colitis.  Will initiate ED code sepsis protocol and broad-spectrum IV antibiotics.  ABG with carboxyhemoglobin less than 2%.  Will consult Hospitalist services for admission.   Paulette Blanch, MD 07/02/22 [phone]

## 2022-07-02 ENCOUNTER — Encounter: Payer: Self-pay | Admitting: Internal Medicine

## 2022-07-02 DIAGNOSIS — D631 Anemia in chronic kidney disease: Secondary | ICD-10-CM | POA: Diagnosis present

## 2022-07-02 DIAGNOSIS — J9611 Chronic respiratory failure with hypoxia: Secondary | ICD-10-CM | POA: Diagnosis present

## 2022-07-02 DIAGNOSIS — E1151 Type 2 diabetes mellitus with diabetic peripheral angiopathy without gangrene: Secondary | ICD-10-CM | POA: Diagnosis present

## 2022-07-02 DIAGNOSIS — Z89412 Acquired absence of left great toe: Secondary | ICD-10-CM | POA: Diagnosis not present

## 2022-07-02 DIAGNOSIS — Z941 Heart transplant status: Secondary | ICD-10-CM | POA: Diagnosis not present

## 2022-07-02 DIAGNOSIS — N186 End stage renal disease: Secondary | ICD-10-CM | POA: Diagnosis present

## 2022-07-02 DIAGNOSIS — Z1152 Encounter for screening for COVID-19: Secondary | ICD-10-CM | POA: Diagnosis not present

## 2022-07-02 DIAGNOSIS — Z992 Dependence on renal dialysis: Secondary | ICD-10-CM

## 2022-07-02 DIAGNOSIS — I10 Essential (primary) hypertension: Secondary | ICD-10-CM | POA: Diagnosis not present

## 2022-07-02 DIAGNOSIS — E876 Hypokalemia: Secondary | ICD-10-CM | POA: Diagnosis not present

## 2022-07-02 DIAGNOSIS — Z888 Allergy status to other drugs, medicaments and biological substances status: Secondary | ICD-10-CM | POA: Diagnosis not present

## 2022-07-02 DIAGNOSIS — J4489 Other specified chronic obstructive pulmonary disease: Secondary | ICD-10-CM | POA: Diagnosis present

## 2022-07-02 DIAGNOSIS — E11649 Type 2 diabetes mellitus with hypoglycemia without coma: Secondary | ICD-10-CM

## 2022-07-02 DIAGNOSIS — E1122 Type 2 diabetes mellitus with diabetic chronic kidney disease: Secondary | ICD-10-CM | POA: Diagnosis present

## 2022-07-02 DIAGNOSIS — E11621 Type 2 diabetes mellitus with foot ulcer: Secondary | ICD-10-CM | POA: Diagnosis present

## 2022-07-02 DIAGNOSIS — I5032 Chronic diastolic (congestive) heart failure: Secondary | ICD-10-CM | POA: Diagnosis present

## 2022-07-02 DIAGNOSIS — K529 Noninfective gastroenteritis and colitis, unspecified: Secondary | ICD-10-CM | POA: Diagnosis present

## 2022-07-02 DIAGNOSIS — L89316 Pressure-induced deep tissue damage of right buttock: Secondary | ICD-10-CM | POA: Diagnosis present

## 2022-07-02 DIAGNOSIS — L97529 Non-pressure chronic ulcer of other part of left foot with unspecified severity: Secondary | ICD-10-CM | POA: Diagnosis present

## 2022-07-02 DIAGNOSIS — E1143 Type 2 diabetes mellitus with diabetic autonomic (poly)neuropathy: Secondary | ICD-10-CM | POA: Diagnosis present

## 2022-07-02 DIAGNOSIS — N2581 Secondary hyperparathyroidism of renal origin: Secondary | ICD-10-CM | POA: Diagnosis present

## 2022-07-02 DIAGNOSIS — Z85528 Personal history of other malignant neoplasm of kidney: Secondary | ICD-10-CM | POA: Diagnosis not present

## 2022-07-02 DIAGNOSIS — I340 Nonrheumatic mitral (valve) insufficiency: Secondary | ICD-10-CM | POA: Diagnosis present

## 2022-07-02 DIAGNOSIS — I132 Hypertensive heart and chronic kidney disease with heart failure and with stage 5 chronic kidney disease, or end stage renal disease: Secondary | ICD-10-CM | POA: Diagnosis present

## 2022-07-02 DIAGNOSIS — L89326 Pressure-induced deep tissue damage of left buttock: Secondary | ICD-10-CM | POA: Diagnosis present

## 2022-07-02 LAB — CBG MONITORING, ED
Glucose-Capillary: 125 mg/dL — ABNORMAL HIGH (ref 70–99)
Glucose-Capillary: 23 mg/dL — CL (ref 70–99)
Glucose-Capillary: 112 mg/dL — ABNORMAL HIGH (ref 70–99)
Glucose-Capillary: 87 mg/dL (ref 70–99)
Glucose-Capillary: 99 mg/dL (ref 70–99)
Glucose-Capillary: 100 mg/dL — ABNORMAL HIGH (ref 70–99)
Glucose-Capillary: 62 mg/dL — ABNORMAL LOW (ref 70–99)
Glucose-Capillary: 73 mg/dL (ref 70–99)
Glucose-Capillary: 78 mg/dL (ref 70–99)
Glucose-Capillary: 65 mg/dL — ABNORMAL LOW (ref 70–99)
Glucose-Capillary: 87 mg/dL (ref 70–99)

## 2022-07-02 LAB — BASIC METABOLIC PANEL
Anion gap: 12 (ref 5–15)
BUN: 17 mg/dL (ref 8–23)
CO2: 27 mmol/L (ref 22–32)
Calcium: 7.6 mg/dL — ABNORMAL LOW (ref 8.9–10.3)
Chloride: 98 mmol/L (ref 98–111)
Creatinine, Ser: 4.56 mg/dL — ABNORMAL HIGH (ref 0.44–1.00)
GFR, Estimated: 10 mL/min — ABNORMAL LOW (ref >60)
Glucose, Bld: 72 mg/dL (ref 70–99)
Potassium: 3.5 mmol/L (ref 3.5–5.1)
Sodium: 137 mmol/L (ref 135–145)

## 2022-07-02 LAB — BLOOD GAS, ARTERIAL
Acid-Base Excess: 8.2 mmol/L — ABNORMAL HIGH (ref 0.0–2.0)
Bicarbonate: 34.4 mmol/L — ABNORMAL HIGH (ref 20.0–28.0)
O2 Content: 2 L/min
O2 Saturation: 70.4 %
Patient temperature: 37
pCO2 arterial: 53 mmHg — ABNORMAL HIGH (ref 32–48)
pH, Arterial: 7.42 (ref 7.35–7.45)
pO2, Arterial: 41 mmHg — ABNORMAL LOW (ref 83–108)

## 2022-07-02 LAB — COOXEMETRY PANEL
Carboxyhemoglobin: 1.8 % — ABNORMAL HIGH (ref 0.5–1.5)
Methemoglobin: <0.7 % (ref 0.0–1.5)
O2 Saturation: 70.4 %
Total hemoglobin: 10 g/dL — ABNORMAL LOW (ref 12.0–16.0)
Total oxygen content: 69 %

## 2022-07-02 LAB — CBC
HCT: 29.1 % — ABNORMAL LOW (ref 36.0–46.0)
Hemoglobin: 9.6 g/dL — ABNORMAL LOW (ref 12.0–15.0)
MCH: 28.5 pg (ref 26.0–34.0)
MCHC: 33 g/dL (ref 30.0–36.0)
MCV: 86.4 fL (ref 80.0–100.0)
Platelets: 101 10*3/uL — ABNORMAL LOW (ref 150–400)
RBC: 3.37 MIL/uL — ABNORMAL LOW (ref 3.87–5.11)
RDW: 14.3 % (ref 11.5–15.5)
WBC: 12.8 10*3/uL — ABNORMAL HIGH (ref 4.0–10.5)
nRBC: 0 % (ref 0.0–0.2)

## 2022-07-02 LAB — LACTIC ACID, PLASMA: Lactic Acid, Venous: 1.5 mmol/L (ref 0.5–1.9)

## 2022-07-02 LAB — HEMOGLOBIN A1C
Hgb A1c MFr Bld: 4.1 % — ABNORMAL LOW (ref 4.8–5.6)
Mean Plasma Glucose: 70.97 mg/dL

## 2022-07-02 LAB — GLUCOSE, CAPILLARY: Glucose-Capillary: 82 mg/dL (ref 70–99)

## 2022-07-02 LAB — SARS CORONAVIRUS 2 BY RT PCR: SARS Coronavirus 2 by RT PCR: NEGATIVE

## 2022-07-02 LAB — TROPONIN I (HIGH SENSITIVITY): Troponin I (High Sensitivity): 58 ng/L — ABNORMAL HIGH (ref <18)

## 2022-07-02 LAB — PROCALCITONIN: Procalcitonin: 0.63 ng/mL

## 2022-07-02 LAB — PROTIME-INR
INR: 1.5 — ABNORMAL HIGH (ref 0.8–1.2)
Prothrombin Time: 18.2 seconds — ABNORMAL HIGH (ref 11.4–15.2)

## 2022-07-02 MED ORDER — ENOXAPARIN SODIUM 40 MG/0.4ML IJ SOSY: 40.0000 mg | PREFILLED_SYRINGE | INTRAMUSCULAR | Status: DC

## 2022-07-02 MED ORDER — HYDROMORPHONE HCL 1 MG/ML IJ SOLN: 0.5000 mg | INTRAMUSCULAR | Status: DC | PRN

## 2022-07-02 MED ORDER — ASPIRIN 81 MG PO TBEC
81.0000 mg | DELAYED_RELEASE_TABLET | Freq: Every day | ORAL | Status: DC
  Administered 2022-07-02 – 2022-07-07 (×6): 81 mg via ORAL
  Filled 2022-07-02 (×6): qty 1

## 2022-07-02 MED ORDER — SODIUM CHLORIDE 0.9 % IV SOLN
2.0000 g | INTRAVENOUS | Status: DC
  Administered 2022-07-03 – 2022-07-06 (×4): 2 g via INTRAVENOUS
  Filled 2022-07-02 (×4): qty 20

## 2022-07-02 MED ORDER — HEPARIN SODIUM (PORCINE) 1000 UNIT/ML DIALYSIS
1000.0000 [IU] | INTRAMUSCULAR | Status: DC | PRN
  Administered 2022-07-08: 1000 [IU]

## 2022-07-02 MED ORDER — CHLORHEXIDINE GLUCONATE CLOTH 2 % EX PADS
6.0000 | MEDICATED_PAD | Freq: Every day | CUTANEOUS | Status: DC
  Administered 2022-07-03 – 2022-07-08 (×6): 6 via TOPICAL
  Filled 2022-07-02: qty 6

## 2022-07-02 MED ORDER — DEXTROSE 10 % IV SOLN: INTRAVENOUS | Status: DC

## 2022-07-02 MED ORDER — DEXTROSE 50 % IV SOLN
INTRAVENOUS | Status: AC
  Administered 2022-07-02: 50 mL
  Filled 2022-07-02: qty 50

## 2022-07-02 MED ORDER — SEVELAMER CARBONATE 800 MG PO TABS
800.0000 mg | ORAL_TABLET | Freq: Four times a day (QID) | ORAL | Status: DC
  Administered 2022-07-02 – 2022-07-07 (×16): 800 mg via ORAL
  Filled 2022-07-02 (×19): qty 1

## 2022-07-02 MED ORDER — ALTEPLASE 2 MG IJ SOLR: 2.0000 mg | Freq: Once | INTRAMUSCULAR | Status: DC | PRN

## 2022-07-02 MED ORDER — GABAPENTIN 100 MG PO CAPS
100.0000 mg | ORAL_CAPSULE | Freq: Three times a day (TID) | ORAL | Status: DC
  Administered 2022-07-02 – 2022-07-08 (×18): 100 mg via ORAL
  Filled 2022-07-02 (×18): qty 1

## 2022-07-02 MED ORDER — HYDROCODONE-ACETAMINOPHEN 5-325 MG PO TABS
1.0000 | ORAL_TABLET | ORAL | Status: DC
  Administered 2022-07-03: 1 via ORAL
  Filled 2022-07-02: qty 1

## 2022-07-02 MED ORDER — LIDOCAINE HCL (PF) 1 % IJ SOLN: 5.0000 mL | INTRAMUSCULAR | Status: DC | PRN

## 2022-07-02 MED ORDER — DEXTROSE 50 % IV SOLN: 1.0000 | Freq: Once | INTRAVENOUS | Status: AC

## 2022-07-02 MED ORDER — INSULIN ASPART 100 UNIT/ML IJ SOLN: 0.0000 [IU] | Freq: Three times a day (TID) | INTRAMUSCULAR | Status: DC

## 2022-07-02 MED ORDER — METRONIDAZOLE 500 MG/100ML IV SOLN
500.0000 mg | Freq: Two times a day (BID) | INTRAVENOUS | Status: DC
  Administered 2022-07-02 – 2022-07-06 (×8): 500 mg via INTRAVENOUS
  Filled 2022-07-02 (×9): qty 100

## 2022-07-02 MED ORDER — ACETAMINOPHEN 325 MG PO TABS
650.0000 mg | ORAL_TABLET | Freq: Four times a day (QID) | ORAL | Status: DC | PRN
  Administered 2022-07-02: 650 mg via ORAL
  Filled 2022-07-02: qty 2

## 2022-07-02 MED ORDER — HEPARIN SODIUM (PORCINE) 5000 UNIT/ML IJ SOLN
5000.0000 [IU] | Freq: Three times a day (TID) | INTRAMUSCULAR | Status: DC
  Administered 2022-07-02 – 2022-07-08 (×19): 5000 [IU] via SUBCUTANEOUS
  Filled 2022-07-02 (×19): qty 1

## 2022-07-02 MED ORDER — ANTICOAGULANT SODIUM CITRATE 4% (200MG/5ML) IV SOLN: 5.0000 mL | Status: DC | PRN

## 2022-07-02 MED ORDER — PENTAFLUOROPROP-TETRAFLUOROETH EX AERO: 1.0000 | INHALATION_SPRAY | CUTANEOUS | Status: DC | PRN

## 2022-07-02 MED ORDER — ACETAMINOPHEN 650 MG RE SUPP: 650.0000 mg | Freq: Four times a day (QID) | RECTAL | Status: DC | PRN

## 2022-07-02 MED ORDER — INSULIN ASPART 100 UNIT/ML IJ SOLN: 0.0000 [IU] | Freq: Every day | INTRAMUSCULAR | Status: DC

## 2022-07-02 MED ORDER — MORPHINE SULFATE (PF) 2 MG/ML IV SOLN: 2.0000 mg | INTRAVENOUS | Status: DC | PRN

## 2022-07-02 MED ORDER — LIDOCAINE-PRILOCAINE 2.5-2.5 % EX CREA: 1.0000 | TOPICAL_CREAM | CUTANEOUS | Status: DC | PRN

## 2022-07-02 MED ORDER — ONDANSETRON HCL 4 MG/2ML IJ SOLN
4.0000 mg | Freq: Four times a day (QID) | INTRAMUSCULAR | Status: DC | PRN
  Administered 2022-07-04 – 2022-07-07 (×2): 4 mg via INTRAVENOUS
  Filled 2022-07-02 (×2): qty 2

## 2022-07-02 MED ORDER — ONDANSETRON HCL 4 MG PO TABS: 4.0000 mg | ORAL_TABLET | Freq: Four times a day (QID) | ORAL | Status: DC | PRN

## 2022-07-02 NOTE — Assessment & Plan Note (Signed)
Patient with fever and abdominal pain without diarrhea and CT abdomen and pelvis showing colonic wall thickening involving descending, sigmoid colon suspicious for colitis, infectious or inflammatory Continue Rocephin and Flagyl Clear liquid diet Pain control, antiemetics, supportive care

## 2022-07-02 NOTE — Assessment & Plan Note (Signed)
Blood sugar was 45 on arrival, improving on its own to 82 CBG every hour 10 show blood sugars staying up Otherwise sliding scale coverage only

## 2022-07-02 NOTE — ED Notes (Signed)
Pt moved to hospital bed and repositioned for comfort. Pt more alert but drowsy. She is easily awakened and is oriented x 4.

## 2022-07-02 NOTE — Progress Notes (Signed)
[facility]  ROUNDING NOTE   Subjective:   [name] is a 61 y.o., female with past medical history of anemia, asthma, diabetes, CAD, PAD, GERD, and end stage renal disease on dialysis.  Patient arrived to emergency department via EMS after being found at home by grandson with multiple space heaters deterrent to high setting and patient wrapped in heated blanket.  Patient has been admitted for Acute colitis [K52.9]  Patient is known to our practice and receives outpatient dialysis treatments at [facility] on a TTS schedule, supervised by Dr. Holley Raring.  Last treatment received on Tuesday.  Patient stated she felt really cold after dialysis.  States this is normal for her.  Continues to complain of generalized pain with worsened pain in her sacral area and legs.  Denies known fever or chills.  Denies nausea, vomiting, or diarrhea.  Denies chest pain or discomfort.  Denies recent missed dialysis treatments.  Labs on ED arrival significant for creatinine 4.33 with GFR 11, calcium 7.9, albumin 3.0, troponin 25, and hemoglobin 9.2.  Blood cultures pending.  CT abdomen pelvis suspicious for colitis bilateral small pleural effusions bilateral inguinal and retroperitoneal adenopathy.  We have been consulted to monitor dialysis needs during this admission.   Objective:  Vital signs in last 24 hours:  Temp:  [98.5 F (36.9 C)-102.3 F (39.1 C)] 99.7 F (37.6 C) (10/11 1434) Pulse Rate:  [65-90] 66 (10/11 1400) Resp:  [11-29] 12 (10/11 1400) BP: (101-166)/(38-73) 101/41 (10/11 1400) SpO2:  [97 %-100 %] 100 % (10/11 1400)  Weight change:  There were no vitals filed for this visit.  Intake/Output: I/O last 3 completed shifts: In: 1200.4 [IV Piggyback:1200.4] Out: -    Intake/Output this shift:  Total I/O In: 100 [IV Piggyback:100] Out: -   Physical Exam: General: NAD  Head: Normocephalic, atraumatic. Moist oral mucosal membranes  Eyes: Anicteric  Lungs:  Clear to  auscultation. Normal effort, [place] O2  Heart: Regular rate and rhythm  Abdomen:  Soft, nontender  Extremities:  trace peripheral edema.  Neurologic: Nonfocal, moving all four extremities  Skin: No lesions, dry  Access: Rt permcath    Basic Metabolic Panel: Recent Labs  Lab 07/01/22 2228 07/02/22 0605  NA 137 137  K 4.7 3.5  CL 96* 98  CO2 28 27  GLUCOSE 45* 72  BUN 14 17  CREATININE 4.33* 4.56*  CALCIUM 7.9* 7.6*    Liver Function Tests: Recent Labs  Lab 07/01/22 2228  AST 33  ALT 7  ALKPHOS 70  BILITOT 1.0  PROT 6.5  ALBUMIN 3.0*   Recent Labs  Lab 07/01/22 2228  LIPASE 23   No results for input(s): "AMMONIA" in the last 168 hours.  CBC: Recent Labs  Lab 07/01/22 2228 07/02/22 0605  WBC 9.3 12.8*  NEUTROABS 6.5  --   HGB 9.2* 9.6*  HCT 29.0* 29.1*  MCV 90.1 86.4  PLT 95* 101*    Cardiac Enzymes: Recent Labs  Lab 07/01/22 2228  CKTOTAL 63    BNP: Invalid input(s): "POCBNP"  CBG: Recent Labs  Lab 07/02/22 0320 07/02/22 0549 07/02/22 0805 07/02/22 0842 07/02/22 1158  GLUCAP 112* 100* 62* 73 76    Microbiology: Results for orders placed or performed during the hospital encounter of 07/01/22  Culture, blood (Routine x 2)     Status: None (Preliminary result)   Collection Time: 07/01/22 11:14 PM   Specimen: BLOOD  Result Value Ref Range Status   Specimen Description BLOOD BLOOD RIGHT  HAND  Final   Special Requests   Final    BOTTLES DRAWN AEROBIC ONLY Blood Culture adequate volume   Culture   Final    NO GROWTH < 12 HOURS Performed at [facility], [address] [place], [place] [address]    Report Status PENDING  Incomplete  SARS Coronavirus 2 by RT PCR (hospital order, performed in [facility] hospital lab) *cepheid single result test* Anterior Nasal Swab     Status: None   Collection Time: 07/02/22 12:56 PM   Specimen: Anterior Nasal Swab  Result Value Ref Range Status   SARS Coronavirus 2 by RT PCR NEGATIVE NEGATIVE  Final    Comment: (NOTE) SARS-CoV-2 target nucleic acids are NOT DETECTED.  The SARS-CoV-2 RNA is generally detectable in upper and lower respiratory specimens during the acute phase of infection. The lowest concentration of SARS-CoV-2 viral copies this assay can detect is 250 copies / mL. A negative result does not preclude SARS-CoV-2 infection and should not be used as the sole basis for treatment or other patient management decisions.  A negative result may occur with improper specimen collection / handling, submission of specimen other than nasopharyngeal swab, presence of viral mutation(s) within the areas targeted by this assay, and inadequate number of viral copies (<250 copies / mL). A negative result must be combined with clinical observations, patient history, and epidemiological information.  Fact Sheet for Patients:   https://www.patel.info/  Fact Sheet for Healthcare Providers: https://hall.com/  This test is not yet approved or  cleared by the [place] FDA and has been authorized for detection and/or diagnosis of SARS-CoV-2 by FDA under an Emergency Use Authorization (EUA).  This EUA will remain in effect (meaning this test can be used) for the duration of the COVID-19 declaration under Section 564(b)(1) of the Act, 21 U.S.C. section 360bbb-3(b)(1), unless the authorization is terminated or revoked sooner.  Performed at [facility], [address] [place],  [address]     Coagulation Studies: Recent Labs    07/02/22 0752  LABPROT 18.2*  INR 1.5*    Urinalysis: No results for input(s): "COLORURINE", "LABSPEC", "PHURINE", "GLUCOSEU", "HGBUR", "BILIRUBINUR", "KETONESUR", "PROTEINUR", "UROBILINOGEN", "NITRITE", "LEUKOCYTESUR" in the last 72 hours.  Invalid input(s): "APPERANCEUR"    Imaging: CT Abdomen Pelvis W Contrast  Result Date: 07/01/2022 CLINICAL DATA:  Acute abdominal pain. Fever.  EXAM: CT ABDOMEN AND PELVIS WITH CONTRAST TECHNIQUE: Multidetector CT imaging of the abdomen and pelvis was performed using the standard protocol following bolus administration of intravenous contrast. RADIATION DOSE REDUCTION: This exam was performed according to the departmental dose-optimization program which includes automated exposure control, adjustment of the mA and/or kV according to patient size and/or use of iterative reconstruction technique. CONTRAST:  124m OMNIPAQUE IOHEXOL 300 MG/ML  SOLN COMPARISON:  CT 04/28/2022 FINDINGS: Lower chest: Small bilateral pleural effusions. Subpleural reticulation in the lung bases, chronic. Upper normal heart size. Dialysis catheter tip in the right atrium. Hepatobiliary: No focal hepatic lesion. Prior cholecystectomy. There is periportal edema. Patent portal vein. Pancreas: Fatty atrophy. There may be mild peripancreatic edema, although this is similar to generalized mesenteric edema. No evidence of pancreatic mass or ductal dilatation. Spleen: Normal in size without focal abnormality. Adrenals/Urinary Tract: No adrenal nodule. Bilateral renal parenchymal atrophy, patient with chronic renal disease. No hydronephrosis. Nondistended urinary bladder. Stomach/Bowel: Equivocal gastric wall thickening. Scattered fluid within small bowel without wall thickening or inflammatory change. No obstruction. The appendix is not definitively seen. There is mild colonic wall  thickening involving the descending, sigmoid, and to a lesser extent ascending colon. Mild associated pericolonic edema. Diverticulosis without diverticulitis. Vascular/Lymphatic: There are enlarged bilateral inguinal lymph nodes, chronic but increasing from prior exam. Largest lymph node on the left measures 15 mm short axis. Largest lymph node on the right measures 11 mm. Prominent bilateral external iliac nodes. There are multiple prominent retroperitoneal nodes, largest measuring 12 mm short axis in the left  periaortic space. Advanced aortic and branch atherosclerosis. No acute vascular findings. Reproductive: Status post hysterectomy. No adnexal masses. Other: Generalized body wall edema. Trace upper abdominal ascites. Generalized mesenteric edema. No free air or focal fluid collection. Musculoskeletal: Schmorl's node in superior endplate of L2, chronic. Disc space at T11-T12 is likely congenital. Slight ground-glass density of the osseous structures consistent with renal osteodystrophy. No acute osseous findings. IMPRESSION: 1. Mild colonic wall thickening involving the descending, sigmoid, and to a lesser extent ascending colon with associated pericolonic edema, suspicious for colitis. This may be infectious or inflammatory. 2. Equivocal gastric wall thickening, can be seen with gastritis. 3. Small bilateral pleural effusions. Generalized body wall edema and trace upper abdominal ascites. Findings suggest third-spacing. 4. Bilateral inguinal and retroperitoneal adenopathy, likely reactive, but nonspecific. Inguinal lymph nodes have increased in size from CT 2 months ago. Recommend clinical and laboratory correlation for lymphoproliferative disorder. 5. Colonic diverticulosis without focal diverticulitis. Aortic Atherosclerosis (ICD10-I70.0). Electronically Signed   By: Keith Rake M.D.   On: 07/01/2022 23:44   DG Chest Port 1 View  Result Date: 07/01/2022 CLINICAL DATA:  Weakness EXAM: PORTABLE CHEST 1 VIEW COMPARISON:  CT chest dated 06/24/2022 FINDINGS: Subpleural reticulation/fibrosis in the lungs bilaterally, lower lobe predominant, chronic. Increased interstitial markings without frank interstitial edema. No definite pleural effusions. Heart is top-normal in size. Left subclavian stent. Right IJ dual lumen dialysis catheter terminates in the right atrium. IMPRESSION: Subpleural reticulation/fibrosis, lower lobe predominant, chronic. No evidence of acute cardiopulmonary disease. Electronically Signed    By: Julian Hy M.D.   On: 07/01/2022 21:12   CT Head Wo Contrast  Result Date: 07/01/2022 CLINICAL DATA:  Mental status change.  Fall. EXAM: CT HEAD WITHOUT CONTRAST TECHNIQUE: Contiguous axial images were obtained from the base of the skull through the vertex without intravenous contrast. RADIATION DOSE REDUCTION: This exam was performed according to the departmental dose-optimization program which includes automated exposure control, adjustment of the mA and/or kV according to patient size and/or use of iterative reconstruction technique. COMPARISON:  CT head 04/28/2022 FINDINGS: Brain: No evidence of acute infarction, hemorrhage, hydrocephalus, extra-axial collection or mass lesion/mass effect. Vascular: Negative for hyperdense vessel Skull: Small lytic lesions in the calvarium bilaterally are chronic and unchanged including 2021 CT. Sinuses/Orbits: Paranasal sinuses clear. Bilateral cataract extraction Other: None IMPRESSION: Negative CT head. Electronically Signed   By: Franchot Gallo M.D.   On: 07/01/2022 21:06     Medications:    anticoagulant sodium citrate     [START ON 07/03/2022] cefTRIAXone (ROCEPHIN)  IV     dextrose 50 mL/hr at 07/02/22 1055   metronidazole Stopped (07/02/22 1405)    aspirin EC  81 mg Oral Daily   Chlorhexidine Gluconate Cloth  6 each Topical Q0600   gabapentin  100 mg Oral TID   heparin injection (subcutaneous)  5,000 Units Subcutaneous Q8H   HYDROcodone-acetaminophen  1 tablet Oral UD   sevelamer carbonate  800 mg Oral QID   acetaminophen **OR** acetaminophen, alteplase, anticoagulant sodium citrate, heparin, HYDROmorphone (DILAUDID) injection, lidocaine (PF), lidocaine-prilocaine, ondansetron **OR**  ondansetron (ZOFRAN) IV, pentafluoroprop-tetrafluoroeth  Assessment/ Plan:  [name] is a 61 y.o.  female with past medical history of anemia, asthma, diabetes, CAD, PAD, GERD, and end stage renal disease on dialysis.  Patient arrived to emergency  department via EMS after being found at home by grandson with multiple space heaters deterrent to high setting and patient wrapped in heated blanket.  Patient has been admitted for Acute colitis [K52.9]   End-stage renal disease on hemodialysis.  Will maintain outpatient schedule if possible.  Last dialysis treatment received on Tuesday.  Next treatment scheduled for Thursday.  2. Anemia of chronic kidney disease Lab Results  Component Value Date   HGB 9.6 (L) 07/02/2022    Hemoglobin within desired range.  Patient receives [place] outpatient.  3. Secondary Hyperparathyroidism: with outpatient labs: PTH 485, phosphorus 3.9, calcium 8.5 on 06/03/22.   Lab Results  Component Value Date   PTH 357 (H) 10/05/2018   CALCIUM 7.6 (L) 07/02/2022   CAION 1.09 (L) 04/13/2020   PHOS 4.9 (H) 12/03/2021   Calcium decreased but phosphorus within desired range.  Calcitriol and calcium carbonate received outpatient.  We will continue to monitor.  Continue sevelamer.  4. Diabetes mellitus type II with chronic kidney disease/renal manifestations:noninsulin dependent. Most recent hemoglobin A1c is 4.1 on 07/02/22.   5.  Hypertension with chronic kidney disease.  Home regimen includes amlodipine, furosemide and hydralazine.  All currently held.  Blood pressure 132/49.   LOS: 0   10/11/20232:42 PM

## 2022-07-02 NOTE — Assessment & Plan Note (Signed)
Nephrology consult for continuation of dialysis 

## 2022-07-02 NOTE — ED Notes (Signed)
Given OJ and grape juice for low CBG

## 2022-07-02 NOTE — ED Notes (Signed)
Pt with critically low CBG of 23. Dr Beather Arbour aware and pt given amp of IV D50 and orange juice to drink. Repeat CBG was 125. Dr Damita Dunnings admitting provider aware

## 2022-07-02 NOTE — Assessment & Plan Note (Signed)
See above. 

## 2022-07-02 NOTE — Progress Notes (Signed)
       CROSS COVER NOTE  NAME: [name] MRN: [id number] DOB : Oct 07, 1960    Date of Service   07/02/2022   HPI/Events of Note   Notified of recurrent severe hypoglycemic event CBG 23.  Interventions   Assessment/Plan:  D10 @ 24m/hr Monitor for signs of FVO     This document was prepared using Dragon voice recognition software and may include unintentional dictation errors.  KNeomia GlassDNP, MBA, FNP-BC Nurse Practitioner [facility] CenterPager [phone]

## 2022-07-02 NOTE — Progress Notes (Signed)
Same day note  Patient seen and examined at bedside.  Patient was admitted to the hospital for heat exposure  At the time of my evaluation, patient complains of feeling sleepy.  Complains of mild abdominal discomfort.  Physical examination reveals obese female on nasal cannula mildly somnolent, nonspecific abdominal tenderness, soft to palpation, leg with lymphedema and discoloration.  Laboratory data and imaging was reviewed  Assessment and Plan.  Acute colitis Patient had abdominal pain fever diarrhea.  CT scan of the abdomen pelvis showed thickening of the colon suspicious for colitis.  On IV Rocephin and Flagyl , continue clear liquid diet, antiemetics, supportive care.  WBC at 12.8.  Blood cultures negative in less than 12 hours.  Procalcitonin 0.6.  Lactate of 1.2.  Lipase of 23.   Hypoglycemia in type 2 diabetes mellitus , without long-term current use of insulin (HCC) Blood glucose level was 45 on presentation.  Lowest blood glucose level was 23.  Currently on D10 and sliding scale insulin.  We will continue to monitor closely.  Discontinue sliding scale insulin for now.  Change Accu-Cheks to every 4.  Anemia in ESRD (end-stage renal disease) (HCC) Hemoglobin at baseline.  Latest hemoglobin of 9.6.  Check levels in AM.   End-stage renal disease on hemodialysis [facility]) Tuesday Thursday Saturday schedule as per the patient.  Nephrology has been consulted   HTN (hypertension) Soft blood pressure on presentation.  Continue to hold amlodipine and hydralazine.  Chronic diastolic CHF (congestive heart failure) (HCC) Euvolemic at this time.      No Charge  Signed,  Delila Pereyra, MD [facility]

## 2022-07-02 NOTE — Assessment & Plan Note (Signed)
Blood pressure controlled but diastolic somewhat soft at 48 Will hold antihypertensives of amlodipine and hydralazine for tonight

## 2022-07-02 NOTE — ED Notes (Signed)
Pt given apple juice and ginger ale for low CBG

## 2022-07-02 NOTE — Hospital Course (Signed)
ESRD on HD TTS, DM, chronic diastolic heart failure, CAD, PVD, Chronic respiratory failure on home O2 at 2 L, hospitalized in August with a fever of unknown source then ultimately found secondary to left great toe wound treated with Zosyn then Cipro,

## 2022-07-02 NOTE — Sepsis Progress Note (Signed)
Elink monitoring for the code sepsis protocol.  

## 2022-07-02 NOTE — Assessment & Plan Note (Signed)
Clinically euvolemic No GDMT meds on med list

## 2022-07-02 NOTE — ED Notes (Signed)
Pt's son reports that pt has an ulcer on her left great toe that is being treated by home health and he questions whether this could be source of infection. He is aware that I will make provider aware. He is leaving and leaves contact # [phone]).

## 2022-07-02 NOTE — H&P (Signed)
History and Physical    Patient: [name] XVQ:[id number] DOB: 28-Feb-1961 DOA: 07/01/2022 DOS: the patient was seen and examined on 07/02/2022 PCP: Denton Lank, MD  Patient coming from: Home  Chief Complaint:  Chief Complaint  Patient presents with   Heat Exposure    HPI: [name] is a 61 y.o. female with medical history significant for Who presents to the ED by EMS after she was found at home by the grandson with the furnace turned up to the highest, the oven turned up to the highest with the oven door open, multiple space heaters on , and patient wrapped in heating blanket on.  Patient was awake and alert when he found her and stated that she was cold after arrival home from dialysis earlier in the day.  Oral temp with EMS was 103.  On arrival to the ED patient was cooled due to an initial suspicion for environmental hyperthermia however after a few hours of being at ambient temperature, temperature rechecked revealed that patient had a true fever of 102.3.  She endorsed abdominal pain but denied nausea or vomiting or diarrhea.  She does not produce urine.  She denied cough, shortness of breath or chest pain.  Patient was somnolent but sufficiently arousable to answer questions. ED course and data review: Tmax 102.3 with pulse 90 respirations 16 and O2 sat 98% on room air.  Labs significant for WBC 9.3, procalcitonin 0.63 and lactic acid 1.2.  Hemoglobin at baseline at 9.2.  Troponin 25.  Lipase and LFTs unremarkable.  An arterial blood gas revealed normal pH with PCO2 of 53.  Blood glucose was 45 on arrival but did improve on its own to 82. CT head negative Chest x-ray no acute cardiopulmonary disease CT abdomen and pelvis showing the following: IMPRESSION: 1. Mild colonic wall thickening involving the descending, sigmoid, and to a lesser extent ascending colon with associated pericolonic edema, suspicious for colitis. This may be infectious or inflammatory. 2. Equivocal gastric wall  thickening, can be seen with gastritis. 3. Small bilateral pleural effusions. Generalized body wall edema and trace upper abdominal ascites. Findings suggest third-spacing. 4. Bilateral inguinal and retroperitoneal adenopathy, likely reactive, but nonspecific. Inguinal lymph nodes have increased in size from CT 2 months ago. Recommend clinical and laboratory correlation for lymphoproliferative disorder. 5. Colonic diverticulosis without focal diverticulitis. Patient started on Rocephin and Flagyl and given an IV fluid bolus and hospitalist consulted for admission.   Review of Systems: As mentioned in the history of present illness. All other systems reviewed and are negative.  Past Medical History:  Diagnosis Date   Anemia    Anginal pain ([place])    Anxiety    Arthritis    Asthma    Broken wrist    Bronchitis    chronic diastolic CHF 0/93/2671   Chronic kidney disease    COPD (chronic obstructive pulmonary disease) ([place])    Coronary artery disease    a. cath 2013: stenting to RCA (report not available); b. cath 2014: LM nl, pLAD 40%, mLAD nl, ost LCx 40%, mid LCx nl, pRCA 30% @ site of prior stent, mRCA 50%   Depression    Diabetes mellitus ([place])    Diabetes mellitus without complication ([place])    Diabetic neuropathy ([place])    dialysis 2006   Diverticulosis    Dizziness    Dyspnea    Elevated lipids    Environmental and seasonal allergies    ESRD (end stage renal disease) on dialysis (  [place])    M-W-F   Gastroparesis    GERD (gastroesophageal reflux disease)    Headache    History of anemia due to chronic kidney disease    History of hiatal hernia    HOH (hard of hearing)    Hx of pancreatitis 2015   Hypertension    Lower extremity edema    Mitral regurgitation    a. echo 10/2013: EF 62%, noWMA, mildly dilated LA, mild to mod MR/TR, GR1DD   Myocardial infarction ([place])    Orthopnea    Parathyroid abnormality ([place])    Peripheral arterial disease ([place])    Pneumonia    Renal  cancer ([place])    Renal insufficiency    Pt is on dialysis on M,W + F.   Wheezing    Past Surgical History:  Procedure Laterality Date   A/V SHUNTOGRAM Left 01/20/2018   Procedure: A/V SHUNTOGRAM;  Surgeon: Algernon Huxley, MD;  Location: [place] [facility];  Service: Cardiovascular;  Laterality: Left;   ABDOMINAL HYSTERECTOMY  1992   AMPUTATION TOE Left 10/02/2017   Procedure: AMPUTATION TOE-LEFT GREAT TOE;  Surgeon: Albertine Patricia, DPM;  Location: [facility];  Service: Podiatry;  Laterality: Left;   APPENDECTOMY     APPLICATION OF WOUND VAC N/A 11/25/2019   Procedure: APPLICATION OF WOUND VAC;  Surgeon: Katha Cabal, MD;  Location: [facility];  Service: Vascular;  Laterality: N/A;   ARTERY BIOPSY Right 10/11/2018   Procedure: BIOPSY TEMPORAL ARTERY;  Surgeon: Vickie Epley, MD;  Location: [facility];  Service: General;  Laterality: Right;   BREAST CYST EXCISION     CARDIAC CATHETERIZATION Left 07/26/2015   Procedure: Left Heart Cath and Coronary Angiography;  Surgeon: Dionisio David, MD;  Location: [place] [facility];  Service: Cardiovascular;  Laterality: Left;   CATARACT EXTRACTION W/ INTRAOCULAR LENS IMPLANT Right    CATARACT EXTRACTION W/PHACO Left 03/10/2017   Procedure: CATARACT EXTRACTION PHACO AND INTRAOCULAR LENS PLACEMENT (IOC);  Surgeon: Birder Robson, MD;  Location: [facility];  Service: Ophthalmology;  Laterality: Left;  [place] 00:51.9 AP% 14.2 CDE 7.39 fluid pack lot # [id number]   CENTRAL LINE INSERTION Right 11/11/2019   Procedure: CENTRAL LINE INSERTION;  Surgeon: Katha Cabal, MD;  Location: [facility];  Service: Vascular;  Laterality: Right;   CENTRAL LINE INSERTION  11/25/2019   Procedure: CENTRAL LINE INSERTION;  Surgeon: Katha Cabal, MD;  Location: [facility];  Service: Vascular;;   CHOLECYSTECTOMY     COLONOSCOPY WITH PROPOFOL N/A 08/12/2016   Procedure: COLONOSCOPY WITH PROPOFOL;  Surgeon: Lollie Sails, MD;  Location: [facility] ENDOSCOPY;  Service:  Endoscopy;  Laterality: N/A;   DIALYSIS FISTULA CREATION Left    upper arm   dialysis grafts     DIALYSIS/PERMA CATHETER INSERTION N/A 11/14/2019   Procedure: DIALYSIS/PERMA CATHETER INSERTION;  Surgeon: Algernon Huxley, MD;  Location: [place] [facility];  Service: Cardiovascular;  Laterality: N/A;   DIALYSIS/PERMA CATHETER INSERTION N/A 02/03/2020   Procedure: DIALYSIS/PERMA CATHETER INSERTION;  Surgeon: Katha Cabal, MD;  Location: [place] [facility];  Service: Cardiovascular;  Laterality: N/A;   DIALYSIS/PERMA CATHETER INSERTION N/A 06/19/2020   Procedure: DIALYSIS/PERMA CATHETER INSERTION;  Surgeon: Katha Cabal, MD;  Location: [place] [facility];  Service: Cardiovascular;  Laterality: N/A;   DIALYSIS/PERMA CATHETER INSERTION N/A 05/07/2021   Procedure: DIALYSIS/PERMA CATHETER INSERTION;  Surgeon: Katha Cabal, MD;  Location: [place] [facility];  Service: Cardiovascular;  Laterality: N/A;  Patient needs  general anethesia   DIALYSIS/PERMA CATHETER INSERTION N/A 06/17/2022   Procedure: DIALYSIS/PERMA CATHETER INSERTION;  Surgeon: Katha Cabal, MD;  Location: [place] [facility];  Service: Cardiovascular;  Laterality: N/A;   DIALYSIS/PERMA CATHETER REMOVAL N/A 05/25/2020   Procedure: DIALYSIS/PERMA CATHETER REMOVAL;  Surgeon: Katha Cabal, MD;  Location: [employer];  Service: Cardiovascular;  Laterality: N/A;   ESOPHAGOGASTRODUODENOSCOPY N/A 03/08/2015   Procedure: ESOPHAGOGASTRODUODENOSCOPY (EGD);  Surgeon: Manya Silvas, MD;  Location: [facility] ENDOSCOPY;  Service: Endoscopy;  Laterality: N/A;   ESOPHAGOGASTRODUODENOSCOPY (EGD) WITH PROPOFOL N/A 03/18/2016   Procedure: ESOPHAGOGASTRODUODENOSCOPY (EGD) WITH PROPOFOL;  Surgeon: [name], MD;  Location: [facility]COPY;  Service: Endoscopy;  Laterality: N/A;   EYE SURGERY Right 2018   FECAL TRANSPLANT N/A 08/23/2015   Procedure: FECAL TRANSPLANT;  Surgeon: Manya Silvas, MD;  Location: [facility]  ENDOSCOPY;  Service: Endoscopy;  Laterality: N/A;   HAND SURGERY Bilateral    HEMATOMA EVACUATION Left 11/25/2019   Procedure: EVACUATION HEMATOMA;  Surgeon: Katha Cabal, MD;  Location: [facility];  Service: Vascular;  Laterality: Left;   I & D EXTREMITY Left 11/25/2019   Procedure: IRRIGATION AND DEBRIDEMENT EXTREMITY;  Surgeon: Katha Cabal, MD;  Location: [facility];  Service: Vascular;  Laterality: Left;   IR FLUORO GUIDE CV LINE RIGHT  04/06/2020   IR INJECT/THERA/INC NEEDLE/CATH/PLC EPI/CERV/THOR W/IMG  08/13/2020   IR RADIOLOGIST EVAL & MGMT  07/28/2019   IR RADIOLOGIST EVAL & MGMT  08/11/2019   LIGATION OF ARTERIOVENOUS  FISTULA Left 11/11/2019   LIGATION OF ARTERIOVENOUS  FISTULA Left 11/11/2019   Procedure: LIGATION OF ARTERIOVENOUS  FISTULA;  Surgeon: Katha Cabal, MD;  Location: [facility];  Service: Vascular;  Laterality: Left;   LIGATIONS OF HERO GRAFT Right 06/13/2020   Procedure: LIGATION / REMOVAL OF RIGHT HERO GRAFT;  Surgeon: Katha Cabal, MD;  Location: [facility];  Service: Vascular;  Laterality: Right;   PERIPHERAL VASCULAR CATHETERIZATION N/A 12/20/2015   Procedure: Thrombectomy of dialysis access versus permcath placement;  Surgeon: Algernon Huxley, MD;  Location: [place] [facility];  Service: Cardiovascular;  Laterality: N/A;   PERIPHERAL VASCULAR CATHETERIZATION N/A 12/20/2015   Procedure: A/V Shunt Intervention;  Surgeon: Algernon Huxley, MD;  Location: [place] [facility];  Service: Cardiovascular;  Laterality: N/A;   PERIPHERAL VASCULAR CATHETERIZATION N/A 12/20/2015   Procedure: A/V Shuntogram/Fistulagram;  Surgeon: Algernon Huxley, MD;  Location: [place] [facility];  Service: Cardiovascular;  Laterality: N/A;   PERIPHERAL VASCULAR CATHETERIZATION N/A 01/02/2016   Procedure: A/V Shuntogram/Fistulagram;  Surgeon: Algernon Huxley, MD;  Location: [place] [facility];  Service: Cardiovascular;  Laterality: N/A;   PERIPHERAL VASCULAR CATHETERIZATION N/A  01/02/2016   Procedure: A/V Shunt Intervention;  Surgeon: Algernon Huxley, MD;  Location: [place] [facility];  Service: Cardiovascular;  Laterality: N/A;   TEE WITHOUT CARDIOVERSION N/A 06/11/2020   Procedure: TRANSESOPHAGEAL ECHOCARDIOGRAM (TEE);  Surgeon: Teodoro Spray, MD;  Location: [facility];  Service: Cardiovascular;  Laterality: N/A;   UPPER EXTREMITY ANGIOGRAPHY Bilateral 11/27/2020   Procedure: UPPER EXTREMITY ANGIOGRAPHY;  Surgeon: Katha Cabal, MD;  Location: [place] [facility];  Service: Cardiovascular;  Laterality: Bilateral;   UPPER EXTREMITY VENOGRAPHY Right 01/18/2020   Procedure: UPPER EXTREMITY VENOGRAPHY;  Surgeon: Katha Cabal, MD;  Location: [place] [facility];  Service: Cardiovascular;  Laterality: Right;   UPPER EXTREMITY VENOGRAPHY Bilateral 11/27/2020   Procedure: UPPER EXTREMITY VENOGRAPHY;  Surgeon: Katha Cabal, MD;  Location: [place] [facility];  Service: Cardiovascular;  Laterality: Bilateral;   UPPER EXTREMITY VENOGRAPHY Left 11/05/2021   Procedure: UPPER EXTREMITY VENOGRAPHY;  Surgeon: Katha Cabal, MD;  Location: [place] [facility];  Service: Cardiovascular;  Laterality: Left;   VASCULAR ACCESS DEVICE INSERTION Right 04/13/2020   Procedure: INSERTION OF HERO VASCULAR ACCESS DEVICE (GRAFT);  Surgeon: Katha Cabal, MD;  Location: [facility];  Service: Vascular;  Laterality: Right;   Social History:  reports that she has never smoked. She has never used smokeless tobacco. She reports that she does not currently use alcohol. She reports current drug use. Drug: Marijuana.  Allergies  Allergen Reactions   Ace Inhibitors Swelling and Anaphylaxis   Ativan [Lorazepam] Other (See Comments)    Reaction:  Hallucinations and headaches   Compazine [Prochlorperazine Edisylate] Anaphylaxis, Nausea And Vomiting and Other (See Comments)    Other reaction(s): dystonia from this vs. Reglan, 23 Jul - patient relates that she takes promethazine  frequently with no problems   Sumatriptan Succinate Other (See Comments)    Other reaction(s): delirium and hallucinations per [facility] records   Zofran [Ondansetron] Nausea And Vomiting    Per pt. she is allergic to zofran or will experience adverse reaction like hallucination    Losartan Nausea Only   Prochlorperazine Other (See Comments)    Reaction:  Unknown . Patient does not remember reaction but she does have vertigo and anxiety along with n and v at times. Could be used to treat any of these    Reglan [Metoclopramide] Other (See Comments)    Per patient her Dr. Evelina Bucy her off it    Scopolamine Other (See Comments)    Dizziness, also has vertigo already   Tape Rash    Plastic tape causes rash   Tapentadol Rash   Ultrasound Gel Itching    Patient states the Ultrasound gel caused itching while in skin contact and resolved when wiped away.    Family History  Problem Relation Age of Onset   Kidney disease Mother    Diabetes Mother    Cancer Father    Kidney disease Sister     Prior to Admission medications   Medication Sig Start Date End Date Taking? Authorizing Provider  acetaminophen (TYLENOL) 325 MG tablet Take 2 tablets (650 mg total) by mouth every 6 (six) hours as needed for mild pain, headache, moderate pain or fever (fever >/= 101). 12/06/21   Bonnielee Haff, MD  albuterol (PROVENTIL) (2.5 MG/3ML) 0.083% nebulizer solution Take 2.5 mg by nebulization every 4 (four) hours as needed for wheezing or shortness of breath. Patient not taking: Reported on 06/17/2022    [provider]  albuterol (VENTOLIN HFA) 108 (90 Base) MCG/ACT inhaler Inhale 2 puffs into the lungs every 4 (four) hours as needed for wheezing or shortness of breath. 11/09/19   [provider]  alum & mag hydroxide-simeth (MAALOX/MYLANTA) 200-200-20 MG/5ML suspension Take 15 mLs by mouth every 6 (six) hours as needed for indigestion or heartburn. 06/25/20   Enzo Bi, MD  amLODipine (NORVASC) 10 MG  tablet Take 1 tablet (10 mg total) by mouth daily. 06/25/20   Enzo Bi, MD  ammonium lactate (LAC-HYDRIN) 12 % lotion Apply 1 application topically 2 (two) times daily as needed for dry skin.     [provider]  aspirin EC 81 MG tablet Take 81 mg by mouth daily.    [provider]  atorvastatin (LIPITOR) 20 MG tablet Take 20 mg by mouth  every evening.  10/24/19   [provider]  calcium carbonate (TUMS - DOSED IN MG ELEMENTAL CALCIUM) 500 MG chewable tablet Chew 2.5 tablets (500 mg of elemental calcium total) by mouth every 6 (six) hours as needed for indigestion. Patient not taking: Reported on 06/17/2022 06/25/20   Enzo Bi, MD  Cholecalciferol (VITAMIN D3) 25 MCG (1000 UT) CAPS Take 1,000 Units by mouth daily.     [provider]  CREON 36000-114000 units CPEP capsule Take 36,000 Units by mouth. Take 2 capsules with meals and 1 capsule with snacks 03/12/22   [provider]  cyanocobalamin 1000 MCG tablet Take 1,000 mcg by mouth daily.     [provider]  epoetin alfa (EPOGEN) 10000 UNIT/ML injection Inject 1 mL (10,000 Units total) into the vein every Monday, Wednesday, and Friday with hemodialysis. 06/27/20   Enzo Bi, MD  Ferrous Sulfate (IRON) 325 (65 Fe) MG TABS Take 325 mg by mouth daily.     [provider]  fluticasone (FLONASE) 50 MCG/ACT nasal spray Place 2 sprays into both nostrils daily. 10/20/21   [provider]  gabapentin (NEURONTIN) 100 MG capsule Take 100 mg by mouth 3 (three) times daily.    [provider]  hydrALAZINE (APRESOLINE) 100 MG tablet Take 0.5 tablets (50 mg total) by mouth 2 (two) times daily. 12/06/21   Bonnielee Haff, MD  HYDROcodone-acetaminophen (NORCO/VICODIN) 5-325 MG tablet Take 1 tablet by mouth as directed. 04/18/22   [provider]  lidocaine (LIDODERM) 5 % Place 1 patch onto the skin daily. (Remove after 12 hours -- leave off for 12 hours before applying new patch)     [provider]  meclizine (ANTIVERT) 25 MG tablet Take 25 mg by mouth every 6 (six) hours as needed for dizziness.    [provider]  mirtazapine (REMERON) 7.5 MG tablet Take 7.5 mg by mouth daily. 04/15/21   [provider]  multivitamin (RENA-VIT) TABS tablet Take 1 tablet by mouth daily.     [provider]  mupirocin ointment (BACTROBAN) 2 % Apply 1 application topically daily as needed (leg rash).  11/09/19   [provider]  nitroGLYCERIN (NITROSTAT) 0.4 MG SL tablet Place 0.4 mg under the tongue every 5 (five) minutes as needed for chest pain.  Patient not taking: Reported on 06/17/2022    [provider]  Nutritional Supplements (FEEDING SUPPLEMENT, NEPRO CARB STEADY,) LIQD Take 237 mLs by mouth 2 (two) times daily between meals. 12/17/20   Lorella Nimrod, MD  pantoprazole (PROTONIX) 40 MG tablet Take 1 tablet (40 mg total) by mouth 2 (two) times daily. 11/17/21 12/17/21  Sharen Hones, MD  pantoprazole (PROTONIX) 40 MG tablet Take 40 mg by mouth daily. 04/16/22   [provider]  polyethylene glycol (MIRALAX / GLYCOLAX) 17 g packet Take 17 g by mouth daily as needed. Patient taking differently: Take 17 g by mouth daily as needed for mild constipation or moderate constipation. 04/02/21   Nolberto Hanlon, MD  Probiotic Product (PROBIOTIC PO) Take 1 capsule by mouth daily.    [provider]  promethazine (PHENERGAN) 12.5 MG tablet Take 1 tablet (12.5 mg total) by mouth every 6 (six) hours as needed for nausea or vomiting. 05/10/21   Wouk, Ailene Rud, MD  sevelamer carbonate (RENVELA) 800 MG tablet Take 800 mg by mouth 4 (four) times daily. (Take with meals and snack)    [provider]    Physical Exam: Vitals:   07/01/22 2210  07/01/22 2228 07/01/22 2339 07/02/22 0000  BP:  (!) 166/58 (!) 158/48 (!) 158/61  Pulse:  88 90 90  Resp:  20 16 (!) 24  Temp: 100.2 F (37.9 C)  (!) 102.3 F (39.1 C)   TempSrc: Oral  Oral    SpO2:  100% 98%    Physical Exam Vitals and nursing note reviewed.  Constitutional:      General: She is sleeping. She is not in acute distress. HENT:     Head: Normocephalic and atraumatic.  Cardiovascular:     Rate and Rhythm: Normal rate and regular rhythm.     Heart sounds: Normal heart sounds.  Pulmonary:     Effort: Pulmonary effort is normal.     Breath sounds: Normal breath sounds.  Abdominal:     Palpations: Abdomen is soft.     Tenderness: There is generalized abdominal tenderness.  Neurological:     Mental Status: She is easily aroused. Mental status is at baseline.     Labs on Admission: I have personally reviewed following labs and imaging studies  CBC: Recent Labs  Lab 07/01/22 2228  WBC 9.3  NEUTROABS 6.5  HGB 9.2*  HCT 29.0*  MCV 90.1  PLT 95*   Basic Metabolic Panel: Recent Labs  Lab 07/01/22 2228  NA 137  K 4.7  CL 96*  CO2 28  GLUCOSE 45*  BUN 14  CREATININE 4.33*  CALCIUM 7.9*   GFR: Estimated Creatinine Clearance: 12.8 mL/min (A) (by C-G formula based on SCr of 4.33 mg/dL (H)). Liver Function Tests: Recent Labs  Lab 07/01/22 2228  AST 33  ALT 7  ALKPHOS 70  BILITOT 1.0  PROT 6.5  ALBUMIN 3.0*   Recent Labs  Lab 07/01/22 2228  LIPASE 23   No results for input(s): "AMMONIA" in the last 168 hours. Coagulation Profile: No results for input(s): "INR", "PROTIME" in the last 168 hours. Cardiac Enzymes: Recent Labs  Lab 07/01/22 2228  CKTOTAL 63   BNP (last 3 results) No results for input(s): "PROBNP" in the last 8760 hours. HbA1C: No results for input(s): "HGBA1C" in the last 72 hours. CBG: Recent Labs  Lab 07/01/22 2336  GLUCAP 82   Lipid Profile: No results for input(s): "CHOL", "HDL", "LDLCALC", "TRIG", "CHOLHDL", "LDLDIRECT" in the last 72 hours. Thyroid Function Tests: No results for input(s): "TSH", "T4TOTAL", "FREET4", "T3FREE", "THYROIDAB" in the last 72 hours. Anemia Panel: No results for input(s):  "VITAMINB12", "FOLATE", "FERRITIN", "TIBC", "IRON", "RETICCTPCT" in the last 72 hours. Urine analysis:    Component Value Date/Time   COLORURINE YELLOW (A) 03/29/2021 0020   APPEARANCEUR TURBID (A) 03/29/2021 0020   APPEARANCEUR Clear 09/15/2014 0947   LABSPEC 1.010 03/29/2021 0020   LABSPEC 1.008 09/15/2014 0947   PHURINE 5.0 03/29/2021 0020   GLUCOSEU >=500 (A) 03/29/2021 0020   GLUCOSEU 150 mg/dL 09/15/2014 0947   HGBUR MODERATE (A) 03/29/2021 0020   BILIRUBINUR NEGATIVE 03/29/2021 0020   BILIRUBINUR Negative 09/15/2014 0947   KETONESUR NEGATIVE 03/29/2021 0020   PROTEINUR 100 (A) 03/29/2021 0020   UROBILINOGEN 0.2 06/30/2010 2130   NITRITE NEGATIVE 03/29/2021 0020   LEUKOCYTESUR LARGE (A) 03/29/2021 0020   LEUKOCYTESUR Negative 09/15/2014 0947    Radiological Exams on Admission: CT Abdomen Pelvis W Contrast  Result Date: 07/01/2022 CLINICAL DATA:  Acute abdominal pain. Fever. EXAM: CT ABDOMEN AND PELVIS WITH CONTRAST TECHNIQUE: Multidetector CT imaging of the abdomen and pelvis was performed using the standard protocol following bolus administration of intravenous contrast. RADIATION DOSE  REDUCTION: This exam was performed according to the departmental dose-optimization program which includes automated exposure control, adjustment of the mA and/or kV according to patient size and/or use of iterative reconstruction technique. CONTRAST:  160m OMNIPAQUE IOHEXOL 300 MG/ML  SOLN COMPARISON:  CT 04/28/2022 FINDINGS: Lower chest: Small bilateral pleural effusions. Subpleural reticulation in the lung bases, chronic. Upper normal heart size. Dialysis catheter tip in the right atrium. Hepatobiliary: No focal hepatic lesion. Prior cholecystectomy. There is periportal edema. Patent portal vein. Pancreas: Fatty atrophy. There may be mild peripancreatic edema, although this is similar to generalized mesenteric edema. No evidence of pancreatic mass or ductal dilatation. Spleen: Normal in size  without focal abnormality. Adrenals/Urinary Tract: No adrenal nodule. Bilateral renal parenchymal atrophy, patient with chronic renal disease. No hydronephrosis. Nondistended urinary bladder. Stomach/Bowel: Equivocal gastric wall thickening. Scattered fluid within small bowel without wall thickening or inflammatory change. No obstruction. The appendix is not definitively seen. There is mild colonic wall thickening involving the descending, sigmoid, and to a lesser extent ascending colon. Mild associated pericolonic edema. Diverticulosis without diverticulitis. Vascular/Lymphatic: There are enlarged bilateral inguinal lymph nodes, chronic but increasing from prior exam. Largest lymph node on the left measures 15 mm short axis. Largest lymph node on the right measures 11 mm. Prominent bilateral external iliac nodes. There are multiple prominent retroperitoneal nodes, largest measuring 12 mm short axis in the left periaortic space. Advanced aortic and branch atherosclerosis. No acute vascular findings. Reproductive: Status post hysterectomy. No adnexal masses. Other: Generalized body wall edema. Trace upper abdominal ascites. Generalized mesenteric edema. No free air or focal fluid collection. Musculoskeletal: Schmorl's node in superior endplate of L2, chronic. Disc space at T11-T12 is likely congenital. Slight ground-glass density of the osseous structures consistent with renal osteodystrophy. No acute osseous findings. IMPRESSION: 1. Mild colonic wall thickening involving the descending, sigmoid, and to a lesser extent ascending colon with associated pericolonic edema, suspicious for colitis. This may be infectious or inflammatory. 2. Equivocal gastric wall thickening, can be seen with gastritis. 3. Small bilateral pleural effusions. Generalized body wall edema and trace upper abdominal ascites. Findings suggest third-spacing. 4. Bilateral inguinal and retroperitoneal adenopathy, likely reactive, but nonspecific.  Inguinal lymph nodes have increased in size from CT 2 months ago. Recommend clinical and laboratory correlation for lymphoproliferative disorder. 5. Colonic diverticulosis without focal diverticulitis. Aortic Atherosclerosis (ICD10-I70.0). Electronically Signed   By: MKeith RakeM.D.   On: 07/01/2022 23:44   DG Chest Port 1 View  Result Date: 07/01/2022 CLINICAL DATA:  Weakness EXAM: PORTABLE CHEST 1 VIEW COMPARISON:  CT chest dated 06/24/2022 FINDINGS: Subpleural reticulation/fibrosis in the lungs bilaterally, lower lobe predominant, chronic. Increased interstitial markings without frank interstitial edema. No definite pleural effusions. Heart is top-normal in size. Left subclavian stent. Right IJ dual lumen dialysis catheter terminates in the right atrium. IMPRESSION: Subpleural reticulation/fibrosis, lower lobe predominant, chronic. No evidence of acute cardiopulmonary disease. Electronically Signed   By: SJulian HyM.D.   On: 07/01/2022 21:12   CT Head Wo Contrast  Result Date: 07/01/2022 CLINICAL DATA:  Mental status change.  Fall. EXAM: CT HEAD WITHOUT CONTRAST TECHNIQUE: Contiguous axial images were obtained from the base of the skull through the vertex without intravenous contrast. RADIATION DOSE REDUCTION: This exam was performed according to the departmental dose-optimization program which includes automated exposure control, adjustment of the mA and/or kV according to patient size and/or use of iterative reconstruction technique. COMPARISON:  CT head 04/28/2022 FINDINGS: Brain: No evidence of acute infarction, hemorrhage, hydrocephalus,  extra-axial collection or mass lesion/mass effect. Vascular: Negative for hyperdense vessel Skull: Small lytic lesions in the calvarium bilaterally are chronic and unchanged including 2021 CT. Sinuses/Orbits: Paranasal sinuses clear. Bilateral cataract extraction Other: None IMPRESSION: Negative CT head. Electronically Signed   By: Franchot Gallo M.D.    On: 07/01/2022 21:06     Data Reviewed: Relevant notes from primary care and specialist visits, past discharge summaries as available in EHR, including Care Everywhere. Prior diagnostic testing as pertinent to current admission diagnoses Updated medications and problem lists for reconciliation ED course, including vitals, labs, imaging, treatment and response to treatment Triage notes, nursing and pharmacy notes and ED provider's notes Notable results as noted in HPI   Assessment and Plan: * Acute colitis Patient with fever and abdominal pain without diarrhea and CT abdomen and pelvis showing colonic wall thickening involving descending, sigmoid colon suspicious for colitis, infectious or inflammatory Continue Rocephin and Flagyl Clear liquid diet Pain control, antiemetics, supportive care  Hypoglycemia in type 2 diabetes mellitus , without long-term current use of insulin (HCC) Blood sugar was 45 on arrival, improving on its own to 82 CBG every hour 10 show blood sugars staying up Otherwise sliding scale coverage only  Anemia in ESRD (end-stage renal disease) (HCC) Hemoglobin at baseline  End-stage renal disease on hemodialysis [facility]) Nephrology consult for continuation of dialysis  HTN (hypertension) Blood pressure controlled but diastolic somewhat soft at 48 Will hold antihypertensives of amlodipine and hydralazine for tonight  Chronic diastolic CHF (congestive heart failure) (HCC) Clinically euvolemic No GDMT meds on med list         DVT prophylaxis: Lovenox  Consults: none  Advance Care Planning:   Code Status: Prior   Family Communication: none  Disposition Plan: Back to previous home environment  Severity of Illness: The appropriate patient status for this patient is INPATIENT. Inpatient status is judged to be reasonable and necessary in order to provide the required intensity of service to ensure the patient's safety. The patient's presenting symptoms,  physical exam findings, and initial radiographic and laboratory data in the context of their chronic comorbidities is felt to place them at high risk for further clinical deterioration. Furthermore, it is not anticipated that the patient will be medically stable for discharge from the hospital within 2 midnights of admission.   * I certify that at the point of admission it is my clinical judgment that the patient will require inpatient hospital care spanning beyond 2 midnights from the point of admission due to high intensity of service, high risk for further deterioration and high frequency of surveillance required.*  Author: Athena Masse, MD 07/02/2022 12:31 AM  For on call review [email or web address].

## 2022-07-03 ENCOUNTER — Other Ambulatory Visit: Payer: Self-pay

## 2022-07-03 DIAGNOSIS — I5032 Chronic diastolic (congestive) heart failure: Secondary | ICD-10-CM | POA: Diagnosis not present

## 2022-07-03 DIAGNOSIS — K529 Noninfective gastroenteritis and colitis, unspecified: Secondary | ICD-10-CM | POA: Diagnosis not present

## 2022-07-03 DIAGNOSIS — N186 End stage renal disease: Secondary | ICD-10-CM | POA: Diagnosis not present

## 2022-07-03 DIAGNOSIS — I10 Essential (primary) hypertension: Secondary | ICD-10-CM | POA: Diagnosis not present

## 2022-07-03 LAB — GLUCOSE, CAPILLARY
Glucose-Capillary: 105 mg/dL — ABNORMAL HIGH (ref 70–99)
Glucose-Capillary: 107 mg/dL — ABNORMAL HIGH (ref 70–99)
Glucose-Capillary: 85 mg/dL (ref 70–99)

## 2022-07-03 MED ORDER — PANCRELIPASE (LIP-PROT-AMYL) 12000-38000 UNITS PO CPEP
72000.0000 [IU] | ORAL_CAPSULE | Freq: Three times a day (TID) | ORAL | Status: DC
  Administered 2022-07-04 – 2022-07-08 (×9): 72000 [IU] via ORAL
  Filled 2022-07-03: qty 6
  Filled 2022-07-03 (×3): qty 2
  Filled 2022-07-03: qty 6
  Filled 2022-07-03: qty 2
  Filled 2022-07-03: qty 6
  Filled 2022-07-03: qty 2
  Filled 2022-07-03: qty 6
  Filled 2022-07-03: qty 2
  Filled 2022-07-03: qty 6
  Filled 2022-07-03: qty 2
  Filled 2022-07-03 (×2): qty 6

## 2022-07-03 MED ORDER — PROMETHAZINE HCL 25 MG PO TABS
12.5000 mg | ORAL_TABLET | Freq: Four times a day (QID) | ORAL | Status: DC | PRN
  Administered 2022-07-04 – 2022-07-05 (×3): 12.5 mg via ORAL
  Filled 2022-07-03 (×6): qty 1

## 2022-07-03 MED ORDER — RENA-VITE PO TABS
1.0000 | ORAL_TABLET | Freq: Every day | ORAL | Status: DC
  Administered 2022-07-03 – 2022-07-05 (×3): 1 via ORAL
  Filled 2022-07-03 (×3): qty 1

## 2022-07-03 MED ORDER — NEPRO/CARBSTEADY PO LIQD: 237.0000 mL | Freq: Two times a day (BID) | ORAL | Status: DC

## 2022-07-03 MED ORDER — PANCRELIPASE (LIP-PROT-AMYL) 12000-38000 UNITS PO CPEP
36000.0000 [IU] | ORAL_CAPSULE | ORAL | Status: DC | PRN
  Administered 2022-07-03 (×2): 36000 [IU] via ORAL
  Filled 2022-07-03 (×2): qty 3

## 2022-07-03 MED ORDER — AMLODIPINE BESYLATE 10 MG PO TABS
10.0000 mg | ORAL_TABLET | Freq: Every day | ORAL | Status: DC
  Administered 2022-07-03 – 2022-07-07 (×5): 10 mg via ORAL
  Filled 2022-07-03 (×5): qty 1

## 2022-07-03 MED ORDER — ALBUTEROL SULFATE (2.5 MG/3ML) 0.083% IN NEBU: 3.0000 mL | INHALATION_SOLUTION | RESPIRATORY_TRACT | Status: DC | PRN

## 2022-07-03 MED ORDER — VITAMIN C 500 MG PO TABS
500.0000 mg | ORAL_TABLET | Freq: Two times a day (BID) | ORAL | Status: DC
  Administered 2022-07-03 – 2022-07-07 (×8): 500 mg via ORAL
  Filled 2022-07-03 (×8): qty 1

## 2022-07-03 MED ORDER — INFLUENZA VAC SPLIT QUAD 0.5 ML IM SUSY: 0.5000 mL | PREFILLED_SYRINGE | INTRAMUSCULAR | Status: DC

## 2022-07-03 MED ORDER — RISAQUAD PO CAPS
1.0000 | ORAL_CAPSULE | Freq: Every day | ORAL | Status: DC
  Administered 2022-07-03 – 2022-07-07 (×5): 1 via ORAL
  Filled 2022-07-03 (×5): qty 1

## 2022-07-03 MED ORDER — MIRTAZAPINE 15 MG PO TABS
7.5000 mg | ORAL_TABLET | Freq: Every day | ORAL | Status: DC
  Administered 2022-07-04 – 2022-07-07 (×4): 7.5 mg via ORAL
  Filled 2022-07-03 (×5): qty 1

## 2022-07-03 MED ORDER — PHENOL 1.4 % MT LIQD
1.0000 | OROMUCOSAL | Status: DC | PRN
  Administered 2022-07-03: 1 via OROMUCOSAL
  Filled 2022-07-03: qty 177

## 2022-07-03 MED ORDER — PANTOPRAZOLE SODIUM 40 MG PO TBEC
40.0000 mg | DELAYED_RELEASE_TABLET | Freq: Every day | ORAL | Status: DC
  Administered 2022-07-03 – 2022-07-06 (×4): 40 mg via ORAL
  Filled 2022-07-03 (×4): qty 1

## 2022-07-03 MED ORDER — MECLIZINE HCL 25 MG PO TABS: 25.0000 mg | ORAL_TABLET | Freq: Four times a day (QID) | ORAL | Status: DC | PRN

## 2022-07-03 MED ORDER — HEPARIN SODIUM (PORCINE) 1000 UNIT/ML IJ SOLN
INTRAMUSCULAR | Status: AC
  Administered 2022-07-03: 1000 [IU]
  Filled 2022-07-03: qty 10

## 2022-07-03 MED ORDER — PANCRELIPASE (LIP-PROT-AMYL) 12000-38000 UNITS PO CPEP: 36000.0000 [IU] | ORAL_CAPSULE | ORAL | Status: DC | PRN

## 2022-07-03 MED ORDER — ZINC SULFATE 220 (50 ZN) MG PO CAPS
220.0000 mg | ORAL_CAPSULE | Freq: Every day | ORAL | Status: DC
  Administered 2022-07-03 – 2022-07-07 (×5): 220 mg via ORAL
  Filled 2022-07-03 (×5): qty 1

## 2022-07-03 MED ORDER — HYDROXYZINE HCL 50 MG PO TABS: 25.0000 mg | ORAL_TABLET | Freq: Three times a day (TID) | ORAL | Status: DC | PRN

## 2022-07-03 MED ORDER — ATORVASTATIN CALCIUM 20 MG PO TABS
20.0000 mg | ORAL_TABLET | Freq: Every day | ORAL | Status: DC
  Administered 2022-07-03 – 2022-07-05 (×3): 20 mg via ORAL
  Filled 2022-07-03 (×3): qty 1

## 2022-07-03 NOTE — Progress Notes (Addendum)
PROGRESS NOTE    [name]  GUR:[id number] DOB: April 07, 1961 DOA: 07/01/2022 PCP: Denton Lank, MD    Brief Narrative:   [name] is a 61 y.o. female with medical history significant for end-stage renal disease on hemodialysis chronic diastolic heart failure, COPD, diabetes mellitus, GERD presented to the hospital provides.  Patient was initially cooled due to initial suspicion for hyperthermia since she had turned her furnace up at home..  Oral temp with EMS was 103.  Patient in the ED endorsed abdominal pain but denied nausea or vomiting or diarrhea.  She does not produce urine.  Is mildly somnolent.  Initial temperature in the ED was 102.3 F.  WBC was 9.3 with procalcitonin of 0.6 and lactate of 1.2.   An arterial blood gas revealed normal pH with PCO2 of 53.  Blood glucose was 45 on arrival but did improve on its own to 82.  CT head was negative for acute findings.  CT scan of the abdomen showed Mild colonic wall thickening involving the descending, sigmoid,and to a lesser extent ascending colon with associated pericolonic edema, suspicious for colitis.  Patient was then admitted hospital for further evaluation and treatment.     Assessment and Plan:  Acute colitis Patient presented with abdominal pain fever diarrhea.  CT scan of the abdomen pelvis showed thickening of the colon suspicious for colitis.  On IV Rocephin and Flagyl , continue supportive care with antiemetics.  Latest WBC at 12.8.  Blood cultures negative in less than 12 hours.  Procalcitonin 0.6.  Lactate of 1.2.  Lipase of 23.  Patient febrile with a Tmax of 100.3 F.  Patient complains of mild nausea but no vomiting.   Hypoglycemia in type 2 diabetes mellitus , without long-term current use of insulin (HCC) Blood glucose level was 45 on presentation.  Lowest blood glucose level was 23.  Currently on D10 due to ongoing hypoglycemia.  Encouraged oral intake at that D10 could be discontinued..  Still has nausea and encouraged to  take Zofran.  Will advance diet to full liquids today.  Nutrition has been consulted.  Anemia in ESRD (end-stage renal disease) (HCC) Hemoglobin at baseline.  Hemoglobin of 9.6 with   End-stage renal disease on full-time today hemodialysis [facility]) Tuesday Thursday Saturday schedule.  Receiving hemodialysis today.  Nephrology on board.   HTN (hypertension) Soft blood pressure on presentation.  amlodipine and hydralazine on hold..  Will resume amlodipine today.   Chronic diastolic CHF (congestive heart failure) (HCC) Euvolemic at this time.  Getting hemodialysis.  Medication as Lasix 40 daily at home.   DVT prophylaxis: heparin injection 5,000 Units Start: 07/02/22 0600   Code Status:     Code Status: Full Code  Disposition: Home in 1-2 days if improved blood glucose, symptomatic improvement.  Status is: Inpatient  Remains inpatient appropriate because: Hypoglycemia, dialysis, IV antibiotics   Family Communication: None at bedside  Consultants:  Nephrology  Procedures:  Hemodialysis  Antimicrobials:  Rocephin and Flagyl 07/02/2022>  Anti-infectives (From admission, onward)    Start     Dose/Rate Route Frequency Ordered Stop   07/03/22 0100  cefTRIAXone (ROCEPHIN) 2 g in sodium chloride 0.9 % 100 mL IVPB        2 g 200 mL/hr over 30 Minutes Intravenous Every 24 hours 07/02/22 0038     07/02/22 1300  metroNIDAZOLE (FLAGYL) IVPB 500 mg        500 mg 100 mL/hr over 60 Minutes Intravenous Every 12 hours 07/02/22  0038     07/02/22 0000  cefTRIAXone (ROCEPHIN) 2 g in sodium chloride 0.9 % 100 mL IVPB        2 g 200 mL/hr over 30 Minutes Intravenous  Once 07/01/22 2351 07/02/22 0056   07/02/22 0000  metroNIDAZOLE (FLAGYL) IVPB 500 mg        500 mg 100 mL/hr over 60 Minutes Intravenous  Once 07/01/22 2351 07/02/22 0202       Subjective: Today, patient was seen and examined at bedside.  Complains of mild nausea.  Seen during hemodialysis.  Feels chilly.  Denies vomiting.   Has poor oral intake.  Spoke about oral intake to avoid hypoglycemia.  Objective: Vitals:   07/02/22 1800 07/02/22 2040 07/02/22 2045 07/03/22 0608  BP: (!) 131/54 126/69  (!) 135/49  Pulse: 66 68  70  Resp: '11 16  17  '$ Temp:  98.5 F (36.9 C)  99.5 F (37.5 C)  TempSrc:      SpO2:  100%  100%  Weight:   81.2 kg   Height:   5' 5.5" (1.664 m)     Intake/Output Summary (Last 24 hours) at 07/03/2022 0741 Last data filed at 07/03/2022 0608 Gross per 24 hour  Intake 1297.37 ml  Output --  Net 1297.37 ml   Filed Weights   07/02/22 2045  Weight: 81.2 kg    Physical Examination: Body mass index is 28.94 kg/m.  General: Obese built, not in obvious distress, nasal cannula oxygen HENT:   No scleral pallor or icterus noted. Oral mucosa is moist.  Chest:  Clear breath sounds.  Diminished breath sounds bilaterally. No crackles or wheezes.  CVS: S1 &S2 heard. No murmur.  Regular rate and rhythm. Abdomen: Soft, nontender, nondistended.  Bowel sounds are heard.   Extremities: No cyanosis, clubbing or edema.  Peripheral pulses are palpable.  Left upper extremity fistula in place. Psych: Alert, awake and oriented, normal mood CNS:  No cranial nerve deficits.  Power equal in all extremities.   Skin: Warm and dry.  No rashes noted.  Data Reviewed:   CBC: Recent Labs  Lab 07/01/22 2228 07/02/22 0605  WBC 9.3 12.8*  NEUTROABS 6.5  --   HGB 9.2* 9.6*  HCT 29.0* 29.1*  MCV 90.1 86.4  PLT 95* 101*    Basic Metabolic Panel: Recent Labs  Lab 07/01/22 2228 07/02/22 0605  NA 137 137  K 4.7 3.5  CL 96* 98  CO2 28 27  GLUCOSE 45* 72  BUN 14 17  CREATININE 4.33* 4.56*  CALCIUM 7.9* 7.6*    Liver Function Tests: Recent Labs  Lab 07/01/22 2228  AST 33  ALT 7  ALKPHOS 70  BILITOT 1.0  PROT 6.5  ALBUMIN 3.0*     Radiology Studies: CT Abdomen Pelvis W Contrast  Result Date: 07/01/2022 CLINICAL DATA:  Acute abdominal pain. Fever. EXAM: CT ABDOMEN AND PELVIS WITH  CONTRAST TECHNIQUE: Multidetector CT imaging of the abdomen and pelvis was performed using the standard protocol following bolus administration of intravenous contrast. RADIATION DOSE REDUCTION: This exam was performed according to the departmental dose-optimization program which includes automated exposure control, adjustment of the mA and/or kV according to patient size and/or use of iterative reconstruction technique. CONTRAST:  143m OMNIPAQUE IOHEXOL 300 MG/ML  SOLN COMPARISON:  CT 04/28/2022 FINDINGS: Lower chest: Small bilateral pleural effusions. Subpleural reticulation in the lung bases, chronic. Upper normal heart size. Dialysis catheter tip in the right atrium. Hepatobiliary: No focal hepatic lesion. Prior cholecystectomy. There  is periportal edema. Patent portal vein. Pancreas: Fatty atrophy. There may be mild peripancreatic edema, although this is similar to generalized mesenteric edema. No evidence of pancreatic mass or ductal dilatation. Spleen: Normal in size without focal abnormality. Adrenals/Urinary Tract: No adrenal nodule. Bilateral renal parenchymal atrophy, patient with chronic renal disease. No hydronephrosis. Nondistended urinary bladder. Stomach/Bowel: Equivocal gastric wall thickening. Scattered fluid within small bowel without wall thickening or inflammatory change. No obstruction. The appendix is not definitively seen. There is mild colonic wall thickening involving the descending, sigmoid, and to a lesser extent ascending colon. Mild associated pericolonic edema. Diverticulosis without diverticulitis. Vascular/Lymphatic: There are enlarged bilateral inguinal lymph nodes, chronic but increasing from prior exam. Largest lymph node on the left measures 15 mm short axis. Largest lymph node on the right measures 11 mm. Prominent bilateral external iliac nodes. There are multiple prominent retroperitoneal nodes, largest measuring 12 mm short axis in the left periaortic space. Advanced aortic  and branch atherosclerosis. No acute vascular findings. Reproductive: Status post hysterectomy. No adnexal masses. Other: Generalized body wall edema. Trace upper abdominal ascites. Generalized mesenteric edema. No free air or focal fluid collection. Musculoskeletal: Schmorl's node in superior endplate of L2, chronic. Disc space at T11-T12 is likely congenital. Slight ground-glass density of the osseous structures consistent with renal osteodystrophy. No acute osseous findings. IMPRESSION: 1. Mild colonic wall thickening involving the descending, sigmoid, and to a lesser extent ascending colon with associated pericolonic edema, suspicious for colitis. This may be infectious or inflammatory. 2. Equivocal gastric wall thickening, can be seen with gastritis. 3. Small bilateral pleural effusions. Generalized body wall edema and trace upper abdominal ascites. Findings suggest third-spacing. 4. Bilateral inguinal and retroperitoneal adenopathy, likely reactive, but nonspecific. Inguinal lymph nodes have increased in size from CT 2 months ago. Recommend clinical and laboratory correlation for lymphoproliferative disorder. 5. Colonic diverticulosis without focal diverticulitis. Aortic Atherosclerosis (ICD10-I70.0). Electronically Signed   By: Keith Rake M.D.   On: 07/01/2022 23:44   DG Chest Port 1 View  Result Date: 07/01/2022 CLINICAL DATA:  Weakness EXAM: PORTABLE CHEST 1 VIEW COMPARISON:  CT chest dated 06/24/2022 FINDINGS: Subpleural reticulation/fibrosis in the lungs bilaterally, lower lobe predominant, chronic. Increased interstitial markings without frank interstitial edema. No definite pleural effusions. Heart is top-normal in size. Left subclavian stent. Right IJ dual lumen dialysis catheter terminates in the right atrium. IMPRESSION: Subpleural reticulation/fibrosis, lower lobe predominant, chronic. No evidence of acute cardiopulmonary disease. Electronically Signed   By: Julian Hy M.D.   On:  07/01/2022 21:12   CT Head Wo Contrast  Result Date: 07/01/2022 CLINICAL DATA:  Mental status change.  Fall. EXAM: CT HEAD WITHOUT CONTRAST TECHNIQUE: Contiguous axial images were obtained from the base of the skull through the vertex without intravenous contrast. RADIATION DOSE REDUCTION: This exam was performed according to the departmental dose-optimization program which includes automated exposure control, adjustment of the mA and/or kV according to patient size and/or use of iterative reconstruction technique. COMPARISON:  CT head 04/28/2022 FINDINGS: Brain: No evidence of acute infarction, hemorrhage, hydrocephalus, extra-axial collection or mass lesion/mass effect. Vascular: Negative for hyperdense vessel Skull: Small lytic lesions in the calvarium bilaterally are chronic and unchanged including 2021 CT. Sinuses/Orbits: Paranasal sinuses clear. Bilateral cataract extraction Other: None IMPRESSION: Negative CT head. Electronically Signed   By: Franchot Gallo M.D.   On: 07/01/2022 21:06      LOS: 1 day    Flora Lipps, MD [facility] Available via Epic secure chat 7am-7pm After these hours,  please refer to coverage provider listed on [email or web address] 07/03/2022, 7:41 AM

## 2022-07-03 NOTE — Progress Notes (Signed)
[facility]  ROUNDING NOTE   Subjective:   [name] is a 61 y.o., female with past medical history of anemia, asthma, diabetes, CAD, PAD, GERD, and end stage renal disease on dialysis.  Patient arrived to emergency department via EMS after being found at home by grandson with multiple space heaters deterrent to high setting and patient wrapped in heated blanket.  Patient has been admitted for Colitis [K52.9] Acute colitis [K52.9] Heat exposure, initial encounter [T67.9XXA] Fever, unspecified fever cause [R50.9] Sepsis, due to unspecified organism, unspecified whether acute organ dysfunction present [facility]) [A41.9]  Patient is known to our practice and receives outpatient dialysis treatments at [facility] on a TTS schedule, supervised by Dr. Holley Raring.    Patient seen and evaluated during dialysis   HEMODIALYSIS FLOWSHEET:  Blood Flow Rate (mL/min): 400 mL/min Arterial Pressure (mmHg): -130 mmHg Venous Pressure (mmHg): 170 mmHg TMP (mmHg): 2 mmHg Ultrafiltration Rate (mL/min): 554 mL/min Dialysate Flow Rate (mL/min): 300 ml/min  Continues to complain mild abd pain and chronic lower leg pain.    Objective:  Vital signs in last 24 hours:  Temp:  [98.3 F (36.8 C)-100.4 F (38 C)] 98.3 F (36.8 C) (10/12 0920) Pulse Rate:  [61-76] 66 (10/12 1100) Resp:  [10-21] 12 (10/12 1100) BP: (101-150)/(41-73) 150/66 (10/12 1100) SpO2:  [100 %] 100 % (10/12 1100) Weight:  [80.1 kg-81.2 kg] 80.1 kg (10/12 0920)  Weight change:  Filed Weights   07/02/22 2045 07/03/22 0920  Weight: 81.2 kg 80.1 kg    Intake/Output: I/O last 3 completed shifts: In: 2497.8 [I.V.:1197.4; IV Piggyback:1300.4] Out: -    Intake/Output this shift:  No intake/output data recorded.  Physical Exam: General: NAD  Head: Normocephalic, atraumatic. Moist oral mucosal membranes  Eyes: Anicteric  Lungs:  Clear to auscultation. Normal effort, [place] O2  Heart: Regular rate and rhythm  Abdomen:   Soft, nontender  Extremities:  trace peripheral edema.  Neurologic: Nonfocal, moving all four extremities  Skin: No lesions, dry  Access: Rt permcath    Basic Metabolic Panel: Recent Labs  Lab 07/01/22 2228 07/02/22 0605  NA 137 137  K 4.7 3.5  CL 96* 98  CO2 28 27  GLUCOSE 45* 72  BUN 14 17  CREATININE 4.33* 4.56*  CALCIUM 7.9* 7.6*     Liver Function Tests: Recent Labs  Lab 07/01/22 2228  AST 33  ALT 7  ALKPHOS 70  BILITOT 1.0  PROT 6.5  ALBUMIN 3.0*    Recent Labs  Lab 07/01/22 2228  LIPASE 23    No results for input(s): "AMMONIA" in the last 168 hours.  CBC: Recent Labs  Lab 07/01/22 2228 07/02/22 0605  WBC 9.3 12.8*  NEUTROABS 6.5  --   HGB 9.2* 9.6*  HCT 29.0* 29.1*  MCV 90.1 86.4  PLT 95* 101*     Cardiac Enzymes: Recent Labs  Lab 07/01/22 2228  CKTOTAL 63     BNP: Invalid input(s): "POCBNP"  CBG: Recent Labs  Lab 07/02/22 1158 07/02/22 1752 07/02/22 1853 07/02/22 2100 07/03/22 0032  GLUCAP 78 65* 87 82 105*     Microbiology: Results for orders placed or performed during the hospital encounter of 07/01/22  Culture, blood (Routine x 2)     Status: None (Preliminary result)   Collection Time: 07/01/22 11:14 PM   Specimen: BLOOD  Result Value Ref Range Status   Specimen Description BLOOD BLOOD RIGHT HAND  Final   Special Requests   Final    BOTTLES  DRAWN AEROBIC ONLY Blood Culture adequate volume   Culture   Final    NO GROWTH 2 DAYS Performed at [facility], [address] [place], [place] [address]    Report Status PENDING  Incomplete  SARS Coronavirus 2 by RT PCR (hospital order, performed in [facility] hospital lab) *cepheid single result test* Anterior Nasal Swab     Status: None   Collection Time: 07/02/22 12:56 PM   Specimen: Anterior Nasal Swab  Result Value Ref Range Status   SARS Coronavirus 2 by RT PCR NEGATIVE NEGATIVE Final    Comment: (NOTE) SARS-CoV-2 target nucleic acids are NOT  DETECTED.  The SARS-CoV-2 RNA is generally detectable in upper and lower respiratory specimens during the acute phase of infection. The lowest concentration of SARS-CoV-2 viral copies this assay can detect is 250 copies / mL. A negative result does not preclude SARS-CoV-2 infection and should not be used as the sole basis for treatment or other patient management decisions.  A negative result may occur with improper specimen collection / handling, submission of specimen other than nasopharyngeal swab, presence of viral mutation(s) within the areas targeted by this assay, and inadequate number of viral copies (<250 copies / mL). A negative result must be combined with clinical observations, patient history, and epidemiological information.  Fact Sheet for Patients:   https://www.patel.info/  Fact Sheet for Healthcare Providers: https://hall.com/  This test is not yet approved or  cleared by the [place] FDA and has been authorized for detection and/or diagnosis of SARS-CoV-2 by FDA under an Emergency Use Authorization (EUA).  This EUA will remain in effect (meaning this test can be used) for the duration of the COVID-19 declaration under Section 564(b)(1) of the Act, 21 U.S.C. section 360bbb-3(b)(1), unless the authorization is terminated or revoked sooner.  Performed at [facility], [address] [place], [place] [address]     Coagulation Studies: Recent Labs    07/02/22 0752  LABPROT 18.2*  INR 1.5*     Urinalysis: No results for input(s): "COLORURINE", "LABSPEC", "PHURINE", "GLUCOSEU", "HGBUR", "BILIRUBINUR", "KETONESUR", "PROTEINUR", "UROBILINOGEN", "NITRITE", "LEUKOCYTESUR" in the last 72 hours.  Invalid input(s): "APPERANCEUR"    Imaging: CT Abdomen Pelvis W Contrast  Result Date: 07/01/2022 CLINICAL DATA:  Acute abdominal pain. Fever. EXAM: CT ABDOMEN AND PELVIS WITH CONTRAST TECHNIQUE: Multidetector  CT imaging of the abdomen and pelvis was performed using the standard protocol following bolus administration of intravenous contrast. RADIATION DOSE REDUCTION: This exam was performed according to the departmental dose-optimization program which includes automated exposure control, adjustment of the mA and/or kV according to patient size and/or use of iterative reconstruction technique. CONTRAST:  178m OMNIPAQUE IOHEXOL 300 MG/ML  SOLN COMPARISON:  CT 04/28/2022 FINDINGS: Lower chest: Small bilateral pleural effusions. Subpleural reticulation in the lung bases, chronic. Upper normal heart size. Dialysis catheter tip in the right atrium. Hepatobiliary: No focal hepatic lesion. Prior cholecystectomy. There is periportal edema. Patent portal vein. Pancreas: Fatty atrophy. There may be mild peripancreatic edema, although this is similar to generalized mesenteric edema. No evidence of pancreatic mass or ductal dilatation. Spleen: Normal in size without focal abnormality. Adrenals/Urinary Tract: No adrenal nodule. Bilateral renal parenchymal atrophy, patient with chronic renal disease. No hydronephrosis. Nondistended urinary bladder. Stomach/Bowel: Equivocal gastric wall thickening. Scattered fluid within small bowel without wall thickening or inflammatory change. No obstruction. The appendix is not definitively seen. There is mild colonic wall thickening involving the descending, sigmoid, and to a lesser extent ascending colon. Mild associated  pericolonic edema. Diverticulosis without diverticulitis. Vascular/Lymphatic: There are enlarged bilateral inguinal lymph nodes, chronic but increasing from prior exam. Largest lymph node on the left measures 15 mm short axis. Largest lymph node on the right measures 11 mm. Prominent bilateral external iliac nodes. There are multiple prominent retroperitoneal nodes, largest measuring 12 mm short axis in the left periaortic space. Advanced aortic and branch atherosclerosis. No  acute vascular findings. Reproductive: Status post hysterectomy. No adnexal masses. Other: Generalized body wall edema. Trace upper abdominal ascites. Generalized mesenteric edema. No free air or focal fluid collection. Musculoskeletal: Schmorl's node in superior endplate of L2, chronic. Disc space at T11-T12 is likely congenital. Slight ground-glass density of the osseous structures consistent with renal osteodystrophy. No acute osseous findings. IMPRESSION: 1. Mild colonic wall thickening involving the descending, sigmoid, and to a lesser extent ascending colon with associated pericolonic edema, suspicious for colitis. This may be infectious or inflammatory. 2. Equivocal gastric wall thickening, can be seen with gastritis. 3. Small bilateral pleural effusions. Generalized body wall edema and trace upper abdominal ascites. Findings suggest third-spacing. 4. Bilateral inguinal and retroperitoneal adenopathy, likely reactive, but nonspecific. Inguinal lymph nodes have increased in size from CT 2 months ago. Recommend clinical and laboratory correlation for lymphoproliferative disorder. 5. Colonic diverticulosis without focal diverticulitis. Aortic Atherosclerosis (ICD10-I70.0). Electronically Signed   By: Keith Rake M.D.   On: 07/01/2022 23:44   DG Chest Port 1 View  Result Date: 07/01/2022 CLINICAL DATA:  Weakness EXAM: PORTABLE CHEST 1 VIEW COMPARISON:  CT chest dated 06/24/2022 FINDINGS: Subpleural reticulation/fibrosis in the lungs bilaterally, lower lobe predominant, chronic. Increased interstitial markings without frank interstitial edema. No definite pleural effusions. Heart is top-normal in size. Left subclavian stent. Right IJ dual lumen dialysis catheter terminates in the right atrium. IMPRESSION: Subpleural reticulation/fibrosis, lower lobe predominant, chronic. No evidence of acute cardiopulmonary disease. Electronically Signed   By: Julian Hy M.D.   On: 07/01/2022 21:12   CT Head Wo  Contrast  Result Date: 07/01/2022 CLINICAL DATA:  Mental status change.  Fall. EXAM: CT HEAD WITHOUT CONTRAST TECHNIQUE: Contiguous axial images were obtained from the base of the skull through the vertex without intravenous contrast. RADIATION DOSE REDUCTION: This exam was performed according to the departmental dose-optimization program which includes automated exposure control, adjustment of the mA and/or kV according to patient size and/or use of iterative reconstruction technique. COMPARISON:  CT head 04/28/2022 FINDINGS: Brain: No evidence of acute infarction, hemorrhage, hydrocephalus, extra-axial collection or mass lesion/mass effect. Vascular: Negative for hyperdense vessel Skull: Small lytic lesions in the calvarium bilaterally are chronic and unchanged including 2021 CT. Sinuses/Orbits: Paranasal sinuses clear. Bilateral cataract extraction Other: None IMPRESSION: Negative CT head. Electronically Signed   By: Franchot Gallo M.D.   On: 07/01/2022 21:06     Medications:    anticoagulant sodium citrate     cefTRIAXone (ROCEPHIN)  IV 2 g (07/03/22 0617)   dextrose 50 mL/hr at 07/03/22 0608   metronidazole 500 mg (07/03/22 0651)    aspirin EC  81 mg Oral Daily   Chlorhexidine Gluconate Cloth  6 each Topical Q0600   gabapentin  100 mg Oral TID   heparin injection (subcutaneous)  5,000 Units Subcutaneous Q8H   HYDROcodone-acetaminophen  1 tablet Oral UD   [START ON 07/04/2022] influenza vac split quadrivalent PF  0.5 mL Intramuscular Tomorrow-1000   sevelamer carbonate  800 mg Oral QID   acetaminophen **OR** acetaminophen, alteplase, anticoagulant sodium citrate, heparin, HYDROmorphone (DILAUDID) injection, lidocaine (PF), lidocaine-prilocaine, ondansetron **  OR** ondansetron (ZOFRAN) IV, pentafluoroprop-tetrafluoroeth, phenol  Assessment/ Plan:  [name] is a 61 y.o.  female with past medical history of anemia, asthma, diabetes, CAD, PAD, GERD, and end stage renal disease on  dialysis.  Patient arrived to emergency department via EMS after being found at home by grandson with multiple space heaters deterrent to high setting and patient wrapped in heated blanket.  Patient has been admitted for Colitis [K52.9] Acute colitis [K52.9] Heat exposure, initial encounter [T67.9XXA] Fever, unspecified fever cause [R50.9] Sepsis, due to unspecified organism, unspecified whether acute organ dysfunction present ([place]) [A41.9]  [facility] [place]/TTS/Rt Permcath  End-stage renal disease on hemodialysis.  Will maintain outpatient schedule if possible.  Receiving dialysis today, Uf goal 1L as tolerated. Next treatment scheduled on Saturday.   2. Anemia of chronic kidney disease Lab Results  Component Value Date   HGB 9.6 (L) 07/02/2022    Patient receives [place] outpatient. Hgb stable  3. Secondary Hyperparathyroidism: with outpatient labs: PTH 485, phosphorus 3.9, calcium 8.5 on 06/03/22.   Lab Results  Component Value Date   PTH 357 (H) 10/05/2018   CALCIUM 7.6 (L) 07/02/2022   CAION 1.09 (L) 04/13/2020   PHOS 4.9 (H) 12/03/2021   Will continue to monitor bone minerals during this admission. Continue Sevelamer.   4. Diabetes mellitus type II with chronic kidney disease/renal manifestations:noninsulin dependent. Most recent hemoglobin A1c is 4.1 on 07/02/22.   5.  Hypertension with chronic kidney disease.  Home regimen includes amlodipine, furosemide and hydralazine.  All currently held.  Blood pressure 147/43 during dialysis   LOS: 1   10/12/202311:10 AM

## 2022-07-03 NOTE — Progress Notes (Signed)
3.25 Hours HD completed with 1033m uf removed . Pt tolerated Hd well Post Report given shift RN  07/03/22 1303  Vitals  Temp 98.1 F (36.7 C)  Temp Source Oral  BP 119/64  MAP (mmHg) 78  BP Location Left Wrist  BP Method Automatic  Patient Position (if appropriate) Lying  Pulse Rate 71  Pulse Rate Source Monitor  ECG Heart Rate 72  Resp 11

## 2022-07-03 NOTE — Hospital Course (Signed)
[name] is a 61 y.o. female with medical history significant for end-stage renal disease on hemodialysis chronic diastolic heart failure, COPD, diabetes mellitus, GERD presented to the hospital provides.  Patient was initially cooled due to initial suspicion for hyperthermia since she had turned her furnace up at home..  Oral temp with EMS was 103.  Patient in the ED endorsed abdominal pain but denied nausea or vomiting or diarrhea.  She does not produce urine.  Is mildly somnolent.  Initial temperature in the ED was 102.3 F.  WBC was 9.3 with procalcitonin of 0.6 and lactate of 1.2.   An arterial blood gas revealed normal pH with PCO2 of 53.  Blood glucose was 45 on arrival but did improve on its own to 82.  CT head was negative for acute findings.  CT scan of the abdomen showed Mild colonic wall thickening involving the descending, sigmoid,and to a lesser extent ascending colon with associated pericolonic edema, suspicious for colitis.  Patient was then admitted hospital for further evaluation and treatment.

## 2022-07-03 NOTE — Plan of Care (Signed)

## 2022-07-03 NOTE — Progress Notes (Signed)
Initial Nutrition Assessment  DOCUMENTATION CODES:   Not applicable  INTERVENTION:   -Advance to full liquid diet this evening and then carb modified in the morning -Renal MVI daily -Nepro Shake po TID, each supplement provides 425 kcal and 19 grams protein  -500 mg vitamin C BID -220 mg zinc sulfate daily x 14 days -Check labs for potential micronutrient deficiencies (hair loss and wounds): essential fatty acid, riboflavin, biotin, iron, zinc, vitamin C, vitamin A  NUTRITION DIAGNOSIS:   Increased nutrient needs related to wound healing as evidenced by estimated needs.  GOAL:   Patient will meet greater than or equal to 90% of their needs  MONITOR:   PO intake, Supplement acceptance, Diet advancement  REASON FOR ASSESSMENT:   Consult Assessment of nutrition requirement/status  ASSESSMENT:   Pt admitted after she was found at home by the grandson with the furnace turned up to the highest, the oven turned up to the highest with the oven door open, multiple space heaters on , and patient wrapped in heating blanket on  Pt admitted with acute colitis.   Reviewed I/O's: +1.3 L x 24 hours and +2.5 L since admission  Case discussed with RN; pt with very poor oral intake and would like RD to evaluate for supplements.   Case discussed with nurse tech; pot is refusing to eat and threw food back at them.   Spoke with pt at bedside. Her main complaint is that she is cold. Per pt, she is always cold and this is not new for her. RD covered her feet, adjusted thermostat, and closed the door for her comfort.   Pt reports she usually consumes 2 meals per day, but did not provide diet recall despite probing. She shares meal schedule is typically the same on HD and non HD days. Pt does not like the options on clear liquid diet and thinks she can tolerate more substantial foods. She reports she will try the jello later.   Noted pt with scaly skin on lower extremities as well as  significant hair loss. Per pt, this is a genetic trait, as all female family members also have hair loss. Hair loss is concerning given ESRD on HD and wounds. RD will check vitamin labs to assess for deficiency.   Reviewed wt hx; wt has been stable over the past 2 months. Per pt, EDW is around 80.5 kg, but weight "goes up and down and has been since I started HD 30 years ago". Suspect edema may be masking further weight loss as well as fat and muscle depletions.   Case discussed with MD; amenable to diet advancement.   Medications reviewed and include creon, remeron, and renvela.   Labs reviewed: CBGS: 65-87.   NUTRITION - FOCUSED PHYSICAL EXAM:  Flowsheet Row Most Recent Value  Orbital Region No depletion  Upper Arm Region No depletion  Thoracic and Lumbar Region No depletion  Buccal Region No depletion  Temple Region No depletion  Clavicle Bone Region No depletion  Clavicle and Acromion Bone Region No depletion  Scapular Bone Region No depletion  Dorsal Hand No depletion  Patellar Region No depletion  Anterior Thigh Region No depletion  Posterior Calf Region No depletion  Edema (RD Assessment) Mild  Hair Reviewed  Eyes Reviewed  Mouth Reviewed  Skin Reviewed  Nails Reviewed       Diet Order:   Diet Order             Diet Carb Modified Fluid consistency: Thin;  Room service appropriate? Yes  Diet effective midnight           Diet full liquid Room service appropriate? Yes; Fluid consistency: Thin  Diet effective now                   EDUCATION NEEDS:   Education needs have been addressed  Skin:  Skin Assessment: Skin Integrity Issues: Skin Integrity Issues:: DTI, Other (Comment) DTI: bilateral buttocks Other: wound to lt great toe  Last BM:  Unknown  Height:   Ht Readings from Last 1 Encounters:  07/02/22 5' 5.5" (1.664 m)    Weight:   Wt Readings from Last 1 Encounters:  07/03/22 78.3 kg    Ideal Body Weight:  58 kg  BMI:  Body mass index is  28.29 kg/m.  Estimated Nutritional Needs:   Kcal:  5436-0677  Protein:  90-105 grams  Fluid:  1000 ml + UOP    Loistine Chance, RD, LDN, [place] Registered Dietitian II Certified Diabetes Care and Education Specialist Please refer to [facility] for RD and/or RD on-call/weekend/after hours pager

## 2022-07-04 DIAGNOSIS — R5381 Other malaise: Secondary | ICD-10-CM

## 2022-07-04 LAB — GLUCOSE, CAPILLARY
Glucose-Capillary: 115 mg/dL — ABNORMAL HIGH (ref 70–99)
Glucose-Capillary: 116 mg/dL — ABNORMAL HIGH (ref 70–99)
Glucose-Capillary: 124 mg/dL — ABNORMAL HIGH (ref 70–99)
Glucose-Capillary: 78 mg/dL (ref 70–99)
Glucose-Capillary: 99 mg/dL (ref 70–99)
Glucose-Capillary: 99 mg/dL (ref 70–99)

## 2022-07-04 NOTE — NC FL2 (Signed)
[place] LEVEL OF CARE SCREENING TOOL     IDENTIFICATION  Patient Name: [name] Birthdate: [birth year] Sex: female Admission Date (Current Location): 07/01/2022  [facility] and [place] Number:  Engineering geologist and Address:         Provider Number: [id number]  Attending Physician Name and Address:  Flora Lipps, MD  Relative Name and Phone Number:       Current Level of Care: SNF Recommended Level of Care: [place] Prior Approval Number:    Date Approved/Denied:   PASRR Number: [id number]  Discharge Plan: SNF    Current Diagnoses: Patient Active Problem List   Diagnosis Date Noted   Debility 07/04/2022   Acute colitis 07/02/2022   Venous ulcer of ankle, right ([place]) 06/30/2022   Pericardial effusion 12/05/2021   Hearing impairment 12/04/2021   Hyponatremia 11/27/2021   COVID-19 virus infection 11/27/2021   Bacterial pneumonia 11/27/2021   Acute GI bleeding 11/15/2021   Upper GI bleed 11/15/2021   Anemia of chronic kidney failure 05/03/2021   Thrombocytopenia ([place]) 05/03/2021   Severe sepsis with septic shock ([place]) 04/25/2021   HCAP (healthcare-associated pneumonia) 88/50/2774   Acute metabolic encephalopathy 12/87/8676   UTI (urinary tract infection) 03/29/2021   Chronic respiratory failure with hypoxia ([place]) 12/18/2020   Superior vena caval syndrome 12/14/2020   Chronic diastolic CHF (congestive heart failure) ([place]) 12/04/2020   Bradycardia 09/17/2020   Advanced care planning/counseling discussion    Discitis of thoracic region    Fluid overload 08/10/2020   HLD (hyperlipidemia) 08/10/2020   Chest pain 08/10/2020   CAD (coronary artery disease) 08/10/2020   GERD (gastroesophageal reflux disease) 08/10/2020   Obesity (BMI 30-39.9) 06/06/2020   Anemia in ESRD (end-stage renal disease) ([place]) 05/31/2020   Type 1 diabetes mellitus ([place]) 05/31/2020   End-stage renal disease ([place]) 05/31/2020   Hypertensive disorder  05/31/2020   Arm pain 05/21/2020   Cellulitis of right leg 05/02/2020   ESRD (end stage renal disease) ([place]) 04/13/2020   SOBOE (shortness of breath on exertion) 02/08/2020   Hematoma of arm, left, subsequent encounter 11/24/2019   Respiratory failure (HCC)    Shortness of breath    Complication of arteriovenous dialysis fistula 11/13/2019   PVD (peripheral vascular disease) ([place]) 11/13/2019   Severe anemia 11/13/2019   Pressure ulcer, stage 2 ([place]) 11/13/2019   Hemorrhagic shock ([place]) 11/11/2019   Leg pain 10/02/2019   CAP (community acquired pneumonia) 10/01/2019   Atherosclerosis of native arteries of extremity with intermittent claudication ([place]) 05/01/2019   Right-sided headache    COPD (chronic obstructive pulmonary disease) ([place]) 10/06/2018   Syncope 10/04/2018   Symptomatic anemia 06/18/2018   Gastroparesis due to DM ([place]) 72/05/4708   Complication of vascular access for dialysis 12/04/2017   Osteomyelitis ([place]) 09/30/2017   Carotid stenosis 06/18/2017   Bilateral carotid artery stenosis 06/16/2017   Benign essential HTN 05/19/2017   CKD (chronic kidney disease) stage 5, GFR less than 15 ml/min ([place]) 05/19/2017   Hyperlipidemia, mixed 05/19/2017   Shortness of breath 05/04/2017   Rash, skin 12/16/2016   Cellulitis of lower extremity 07/29/2016   Chronic venous insufficiency 07/29/2016   Lymphedema 07/29/2016   TIA (transient ischemic attack) 04/21/2016   Altered mental status 04/08/2016   Hyperammonemia ([place]) 04/08/2016   Elevated troponin 04/08/2016   Depression 04/08/2016   Depression, major, recurrent, severe with psychosis ([place]) 04/08/2016   Blood in stool    Intractable cyclical vomiting with nausea    Reflux esophagitis  Gastritis    Generalized abdominal pain    Uncontrollable vomiting    Major depressive disorder, recurrent episode, moderate (HCC) 03/15/2016   Adjustment disorder with mixed anxiety and depressed mood 03/15/2016   Malnutrition of  moderate degree 12/01/2015   Renal mass    Dyspnea    Acute renal failure (HCC)    Respiratory failure ([place])    Fever 11/14/2015   Encounter for central line placement    Encounter for orogastric (OG) tube placement    Nausea 11/12/2015   Hyperkalemia 10/03/2015   Diarrhea, unspecified 07/22/2015   Hypoglycemia 04/24/2015   Unresponsiveness 04/24/2015   Sinus bradycardia 04/24/2015   Hypothermia 04/24/2015   Acute hypoxemic respiratory failure ([place]) 04/24/2015   Acute on chronic diastolic CHF (congestive heart failure) ([place]) 04/05/2015   Diabetic gastroparesis ([place]) 04/05/2015   Hypokalemia 04/05/2015   Physical debility 04/05/2015   Acute pulmonary edema (HCC) 04/03/2015   Nausea and vomiting 04/03/2015   Hypoglycemia associated with diabetes ([place]) 04/03/2015   Anemia of chronic disease 04/03/2015   Secondary hyperparathyroidism ([place]) 04/03/2015   Pressure ulcer 04/02/2015   Acute respiratory failure with hypoxia ([place]) 04/01/2015   Adjustment disorder with anxiety 03/14/2015   Somatic symptom disorder, mild 03/08/2015   Coronary artery disease involving native coronary artery of native heart without angina pectoris    Nausea & vomiting 03/06/2015   Abdominal pain 03/06/2015   Hypoglycemia in type 2 diabetes mellitus , without long-term current use of insulin ([place]) 03/06/2015   HTN (hypertension) 03/06/2015   Gastroparesis 02/24/2015   Pleural effusion 02/19/2015   Bacteremia due to Enterococcus 02/19/2015   End-stage renal disease on hemodialysis ([place]) 02/19/2015   Diarrhea 08/01/2014    Orientation RESPIRATION BLADDER Height & Weight     Time, Situation, Place, Self  O2 (2 L) Continent Weight: 172 lb 9.9 oz (78.3 kg) Height:  5' 5.5" (166.4 cm)  BEHAVIORAL SYMPTOMS/MOOD NEUROLOGICAL BOWEL NUTRITION STATUS   (none)  (none) Continent Diet (carb modified)  AMBULATORY STATUS COMMUNICATION OF NEEDS Skin   Limited Assist Verbally Normal                        Personal Care Assistance Level of Assistance  Dressing, Bathing, Feeding Bathing Assistance: Limited assistance Feeding assistance: Limited assistance Dressing Assistance: Limited assistance     Functional Limitations Info  Sight, Hearing, Speech Sight Info: Adequate Hearing Info: Adequate Speech Info: Adequate    SPECIAL CARE FACTORS FREQUENCY  PT (By licensed PT)     PT Frequency: 5x a week              Contractures Contractures Info: Not present    Additional Factors Info  Code Status, Allergies Code Status Info: Full Allergies Info: Ace inhibitors, Ativan, Compazine, Sumatriptan, Zofran, Losartan, Prochlorperazine, Reglan, Scopolamine, Tape, Tapentadol, Ultrasound gel           Current Medications (07/04/2022):  This is the current hospital active medication list Current Facility-Administered Medications  Medication Dose Route Frequency Provider Last Rate Last Admin   acetaminophen (TYLENOL) tablet 650 mg  650 mg Oral Q6H PRN Athena Masse, MD   650 mg at 07/02/22 1207   Or   acetaminophen (TYLENOL) suppository 650 mg  650 mg Rectal Q6H PRN Athena Masse, MD       acidophilus (RISAQUAD) capsule 1 capsule  1 capsule Oral Daily Pokhrel, Laxman, MD   1 capsule at 07/04/22 0912   albuterol (PROVENTIL) (2.5 MG/3ML) 0.083%  nebulizer solution 3 mL  3 mL Inhalation Q4H PRN Pokhrel, Laxman, MD       alteplase (CATHFLO ACTIVASE) injection 2 mg  2 mg Intracatheter Once PRN Colon Flattery, NP       amLODipine (NORVASC) tablet 10 mg  10 mg Oral QHS Pokhrel, Laxman, MD   10 mg at 07/03/22 2137   anticoagulant sodium citrate solution 5 mL  5 mL Intracatheter PRN Colon Flattery, NP       ascorbic acid (VITAMIN C) tablet 500 mg  500 mg Oral BID Pokhrel, Laxman, MD   500 mg at 07/04/22 0913   aspirin EC tablet 81 mg  81 mg Oral Daily Athena Masse, MD   81 mg at 07/04/22 0913   atorvastatin (LIPITOR) tablet 20 mg  20 mg Oral QHS Pokhrel, Laxman, MD   20 mg at  07/03/22 2137   cefTRIAXone (ROCEPHIN) 2 g in sodium chloride 0.9 % 100 mL IVPB  2 g Intravenous Q24H Athena Masse, MD   Stopped at 07/04/22 0212   Chlorhexidine Gluconate Cloth 2 % PADS 6 each  6 each Topical Q0600 Colon Flattery, NP   6 each at 07/04/22 5102   feeding supplement (NEPRO CARB STEADY) liquid 237 mL  237 mL Oral BID BM Pokhrel, Laxman, MD       gabapentin (NEURONTIN) capsule 100 mg  100 mg Oral TID Athena Masse, MD   100 mg at 07/04/22 1628   heparin injection 1,000 Units  1,000 Units Intracatheter PRN Colon Flattery, NP   1,000 Units at 07/03/22 1249   heparin injection 5,000 Units  5,000 Units Subcutaneous Q8H Lorna Dibble, RPH   5,000 Units at 07/04/22 1320   HYDROcodone-acetaminophen (NORCO/VICODIN) 5-325 MG per tablet 1 tablet  1 tablet Oral UD Athena Masse, MD   1 tablet at 07/03/22 5852   HYDROmorphone (DILAUDID) injection 0.5 mg  0.5 mg Intravenous Q2H PRN Beers, Shanon Brow, RPH       hydrOXYzine (ATARAX) tablet 25 mg  25 mg Oral TID PRN Pokhrel, Laxman, MD       influenza vac split quadrivalent PF (FLUARIX) injection 0.5 mL  0.5 mL Intramuscular Tomorrow-1000 Enzo Bi, MD       lidocaine (PF) (XYLOCAINE) 1 % injection 5 mL  5 mL Intradermal PRN Colon Flattery, NP       lidocaine-prilocaine (EMLA) cream 1 Application  1 Application Topical PRN Colon Flattery, NP       lipase/protease/amylase (CREON) capsule 36,000 Units  36,000 Units Oral PRN Benita Gutter, RPH   36,000 Units at 07/03/22 1724   lipase/protease/amylase (CREON) capsule 72,000 Units  72,000 Units Oral TID AC Pokhrel, Laxman, MD   72,000 Units at 07/04/22 1628   meclizine (ANTIVERT) tablet 25 mg  25 mg Oral Q6H PRN Pokhrel, Laxman, MD       metroNIDAZOLE (FLAGYL) IVPB 500 mg  500 mg Intravenous Q12H Judd Gaudier V, MD 100 mL/hr at 07/04/22 1319 500 mg at 07/04/22 1319   mirtazapine (REMERON) tablet 7.5 mg  7.5 mg Oral QHS Pokhrel, Laxman, MD       multivitamin (RENA-VIT) tablet 1  tablet  1 tablet Oral QHS Pokhrel, Laxman, MD   1 tablet at 07/03/22 2137   ondansetron (ZOFRAN) tablet 4 mg  4 mg Oral Q6H PRN Athena Masse, MD       Or   ondansetron [facility]) injection 4 mg  4 mg Intravenous Q6H PRN Athena Masse, MD  4 mg at 07/04/22 1021   pantoprazole (PROTONIX) EC tablet 40 mg  40 mg Oral Daily Pokhrel, Laxman, MD   40 mg at 07/04/22 0913   pentafluoroprop-tetrafluoroeth (GEBAUERS) aerosol 1 Application  1 Application Topical PRN Colon Flattery, NP       phenol (CHLORASEPTIC) mouth spray 1 spray  1 spray Mouth/Throat PRN Pokhrel, Laxman, MD   1 spray at 07/03/22 0553   promethazine (PHENERGAN) tablet 12.5 mg  12.5 mg Oral Q6H PRN Pokhrel, Laxman, MD   12.5 mg at 07/04/22 1051   sevelamer carbonate (RENVELA) tablet 800 mg  800 mg Oral QID Judd Gaudier V, MD   800 mg at 07/04/22 1628   zinc sulfate capsule 220 mg  220 mg Oral Daily Pokhrel, Laxman, MD   220 mg at 07/04/22 2831     Discharge Medications: Please see discharge summary for a list of discharge medications.  Relevant Imaging Results:  Relevant Lab Results:   Additional Information    Colen Darling, LCSWA

## 2022-07-04 NOTE — Evaluation (Signed)
Physical Therapy Evaluation Patient Details Name: [name] MRN: [id number] DOB: 1960/12/30 Today's Date: 07/04/2022  History of Present Illness  [name] is a 61 y.o. female with medical history significant for end-stage renal disease on hemodialysis chronic diastolic heart failure, COPD, diabetes mellitus, GERD presented to the hospital provides.  Patient was initially cooled due to initial suspicion for hyperthermia since she had turned her furnace up at home..  Oral temp with EMS was 103.  Patient in the ED endorsed abdominal pain but denied nausea or vomiting or diarrhea.   Clinical Impression  Pt admitted with above diagnosis. Pt received sitting EoB agreeable to PT. [place] doffed around pt's neck. Reports currently living alone in apt mod-I with use of RW and 4WW in apt and short community distances/outside. Does rely on neighbor/friend for some assist and transportation, but overall indep with ADL's. Denies falls in last 6 months. SPO2 on RA at 77% thus [place] returned properly on 2L/min with quick return to >90%. No portable O2 tanks on unit and eval limited to O2 line. Pt able to stand with minguard but with bed elevated and increased time to attain standing. Pt then ambulating 12' minguard with very slowed gait speed but safe use of RW and safe sequencing with turns. In standing able to don slippers. Pt subjectively stating she is moving much slower than her baseline. Thus returned to sitting in recliner. Intermittent bouts of standing and seated rest required due to nausea and feelings of need to vomit but without success during eval. Education provided due to limited family support and notable fatigue/weakness and some dizziness, pt would benefit from STR placement due to these deficits. Pt in agreement. Medical team update. Pt left in recliner with all needs in reach with RN in room. Pt currently with functional limitations due to the deficits listed below (see PT Problem List). Pt will benefit  from skilled PT to increase their independence and safety with mobility to allow discharge to the venue listed below.        Recommendations for follow up therapy are one component of a multi-disciplinary discharge planning process, led by the attending physician.  Recommendations may be updated based on patient status, additional functional criteria and insurance authorization.  Follow Up Recommendations Skilled nursing-short term rehab (<3 hours/day) Can patient physically be transported by private vehicle: Yes    Assistance Recommended at Discharge Frequent or constant Supervision/Assistance  Patient can return home with the following  A little help with walking and/or transfers;A little help with bathing/dressing/bathroom;Assistance with cooking/housework;Assist for transportation;Help with stairs or ramp for entrance    Equipment Recommendations Other (comment) (defer to next venue of care)  Recommendations for Other Services       Functional Status Assessment Patient has had a recent decline in their functional status and demonstrates the ability to make significant improvements in function in a reasonable and predictable amount of time.     Precautions / Restrictions Precautions Precautions: Fall Restrictions Weight Bearing Restrictions: No      Mobility  Bed Mobility               General bed mobility comments: NT. Seated EOB. Patient Response: Cooperative  Transfers Overall transfer level: Needs assistance Equipment used: Rolling walker (2 wheels) Transfers: Sit to/from Stand Sit to Stand: Min guard, From elevated surface           General transfer comment: increased time but performs without external support    Ambulation/Gait Ambulation/Gait assistance:  Min guard Gait Distance (Feet): 12 Feet Assistive device: Rolling walker (2 wheels) Gait Pattern/deviations: Step-through pattern, Decreased step length - right, Decreased step length - left, Trunk  flexed, Narrow base of support       General Gait Details: significantly slowed gait cadence with heavy reliance on RW for support. Overall steady with RW use and safe with turns though.  Stairs            Wheelchair Mobility    Modified Rankin (Stroke Patients Only)       Balance Overall balance assessment: Needs assistance Sitting-balance support: Bilateral upper extremity supported, Feet supported Sitting balance-Leahy Scale: Fair       Standing balance-Leahy Scale: Poor Standing balance comment: heavy reliance on RW                             Pertinent Vitals/Pain Pain Assessment Pain Assessment: No/denies pain    Home Living Family/patient expects to be discharged to:: Private residence Living Arrangements: Alone Available Help at Discharge: Family;Available PRN/intermittently;Friend(s) Type of Home: Apartment Home Access: Level entry       Home Layout: One level Home Equipment: Conservation officer, nature (2 wheels);Cane - single point;Wheelchair Probation officer (4 wheels);BSC/3in1;Cane - quad Additional Comments: Reports getting rid of her hospital bed    Prior Function Prior Level of Function : Needs assist             Mobility Comments: uses RW in house, 4WW outside and for community like MD appts ADLs Comments: Friend assists PRN. Does all her ADL's. Relies on friend for transportation along with ACTA     Hand Dominance        Extremity/Trunk Assessment   Upper Extremity Assessment Upper Extremity Assessment: Generalized weakness    Lower Extremity Assessment Lower Extremity Assessment: Generalized weakness       Communication   Communication: No difficulties  Cognition Arousal/Alertness: Awake/alert Behavior During Therapy: WFL for tasks assessed/performed Overall Cognitive Status: Within Functional Limits for tasks assessed                                          General Comments General comments (skin  integrity, edema, etc.): Pt removed [place] when PT entering room. SPO2 at 77%. Placed on her 2L/min with SPO2 quickly returning to >90%. HR WNL. NO SOB or symptoms when on RA.    Exercises Other Exercises Other Exercises: Role of PT in acute setting, d/c recs.   Assessment/Plan    PT Assessment Patient needs continued PT services  PT Problem List Decreased strength;Decreased mobility;Decreased activity tolerance;Decreased balance       PT Treatment Interventions DME instruction;Therapeutic exercise;Gait training;Balance training;Neuromuscular re-education;Functional mobility training;Therapeutic activities;Patient/family education    PT Goals (Current goals can be found in the Care Plan section)  Acute Rehab PT Goals Patient Stated Goal: return home PT Goal Formulation: With patient Time For Goal Achievement: 07/18/22 Potential to Achieve Goals: Fair    Frequency Min 2X/week     Co-evaluation               AM-PAC PT "6 Clicks" Mobility  Outcome Measure Help needed turning from your back to your side while in a flat bed without using bedrails?: A Little Help needed moving from lying on your back to sitting on the side of a flat bed without using bedrails?:  A Little Help needed moving to and from a bed to a chair (including a wheelchair)?: A Lot Help needed standing up from a chair using your arms (e.g., wheelchair or bedside chair)?: A Little Help needed to walk in hospital room?: A Lot Help needed climbing 3-5 steps with a railing? : A Lot 6 Click Score: 15    End of Session Equipment Utilized During Treatment: Gait belt;Oxygen Activity Tolerance: Patient limited by fatigue;Treatment limited secondary to medical complications (Comment);Other (comment) (Nausea, oxygen lines) Patient left: in chair;with call bell/phone within reach;with nursing/sitter in room Nurse Communication: Mobility status PT Visit Diagnosis: Other abnormalities of gait and mobility (R26.89);Muscle  weakness (generalized) (M62.81)    Time: 4446-1901 PT Time Calculation (min) (ACUTE ONLY): 25 min   Charges:   PT Evaluation $PT Eval Moderate Complexity: 1 Mod PT Treatments $Therapeutic Activity: 8-22 mins       Mylia Pondexter M. Fairly IV, PT, DPT Physical Therapist- [facility]  07/04/2022, 11:36 AM

## 2022-07-04 NOTE — Progress Notes (Signed)
[facility]  ROUNDING NOTE   Subjective:   [name] is a 61 y.o., female with past medical history of anemia, asthma, diabetes, CAD, PAD, GERD, and end stage renal disease on dialysis.  Patient arrived to emergency department via EMS after being found at home by grandson with multiple space heaters deterrent to high setting and patient wrapped in heated blanket.  Patient has been admitted for Colitis [K52.9] Acute colitis [K52.9] Heat exposure, initial encounter [T67.9XXA] Fever, unspecified fever cause [R50.9] Sepsis, due to unspecified organism, unspecified whether acute organ dysfunction present [facility]) [A41.9]  Patient is known to our practice and receives outpatient dialysis treatments at [facility] on a TTS schedule, supervised by Dr. Holley Raring.    Patient seen laying in bed, remains drowsy Continues to complain of generalized pain Denies nausea and vomiting Poor appetite persist   Objective:  Vital signs in last 24 hours:  Temp:  [98.1 F (36.7 C)-99.2 F (37.3 C)] 98.9 F (37.2 C) (10/13 0903) Pulse Rate:  [57-71] 57 (10/13 0903) Resp:  [10-18] 18 (10/13 0903) BP: (119-154)/(41-69) 135/60 (10/13 0903) SpO2:  [100 %] 100 % (10/13 0903) Weight:  [78.3 kg] 78.3 kg (10/12 1303)  Weight change: -1.1 kg Filed Weights   07/02/22 2045 07/03/22 0920 07/03/22 1303  Weight: 81.2 kg 80.1 kg 78.3 kg    Intake/Output: I/O last 3 completed shifts: In: 1951.7 [I.V.:1651.7; IV Piggyback:300] Out: 1000 [Other:1000]   Intake/Output this shift:  Total I/O In: 638.1 [P.O.:240; I.V.:198.1; IV Piggyback:200] Out: -   Physical Exam: General: NAD  Head: Normocephalic, atraumatic. Moist oral mucosal membranes  Eyes: Anicteric  Lungs:  Clear to auscultation. Normal effort, [place] O2  Heart: Regular rate and rhythm  Abdomen:  Soft, nontender  Extremities:  trace peripheral edema.  Neurologic: Nonfocal, moving all four extremities  Skin: No lesions, dry  Access: Rt  permcath    Basic Metabolic Panel: Recent Labs  Lab 07/01/22 2228 07/02/22 0605  NA 137 137  K 4.7 3.5  CL 96* 98  CO2 28 27  GLUCOSE 45* 72  BUN 14 17  CREATININE 4.33* 4.56*  CALCIUM 7.9* 7.6*     Liver Function Tests: Recent Labs  Lab 07/01/22 2228  AST 33  ALT 7  ALKPHOS 70  BILITOT 1.0  PROT 6.5  ALBUMIN 3.0*    Recent Labs  Lab 07/01/22 2228  LIPASE 23    No results for input(s): "AMMONIA" in the last 168 hours.  CBC: Recent Labs  Lab 07/01/22 2228 07/02/22 0605  WBC 9.3 12.8*  NEUTROABS 6.5  --   HGB 9.2* 9.6*  HCT 29.0* 29.1*  MCV 90.1 86.4  PLT 95* 101*     Cardiac Enzymes: Recent Labs  Lab 07/01/22 2228  CKTOTAL 63     BNP: Invalid input(s): "POCBNP"  CBG: Recent Labs  Lab 07/03/22 1447 07/03/22 1817 07/04/22 0029 07/04/22 0536 07/04/22 0904  GLUCAP 85 107* 116* 124* 115*     Microbiology: Results for orders placed or performed during the hospital encounter of 07/01/22  Culture, blood (Routine x 2)     Status: None (Preliminary result)   Collection Time: 07/01/22 11:14 PM   Specimen: BLOOD  Result Value Ref Range Status   Specimen Description BLOOD BLOOD RIGHT HAND  Final   Special Requests   Final    BOTTLES DRAWN AEROBIC ONLY Blood Culture adequate volume   Culture   Final    NO GROWTH 3 DAYS Performed at [facility]  Lab, [place], [place] [address]    Report Status PENDING  Incomplete  SARS Coronavirus 2 by RT PCR (hospital order, performed in [facility] hospital lab) *cepheid single result test* Anterior Nasal Swab     Status: None   Collection Time: 07/02/22 12:56 PM   Specimen: Anterior Nasal Swab  Result Value Ref Range Status   SARS Coronavirus 2 by RT PCR NEGATIVE NEGATIVE Final    Comment: (NOTE) SARS-CoV-2 target nucleic acids are NOT DETECTED.  The SARS-CoV-2 RNA is generally detectable in upper and lower respiratory specimens during the acute phase of infection. The  lowest concentration of SARS-CoV-2 viral copies this assay can detect is 250 copies / mL. A negative result does not preclude SARS-CoV-2 infection and should not be used as the sole basis for treatment or other patient management decisions.  A negative result may occur with improper specimen collection / handling, submission of specimen other than nasopharyngeal swab, presence of viral mutation(s) within the areas targeted by this assay, and inadequate number of viral copies (<250 copies / mL). A negative result must be combined with clinical observations, patient history, and epidemiological information.  Fact Sheet for Patients:   https://www.patel.info/  Fact Sheet for Healthcare Providers: https://hall.com/  This test is not yet approved or  cleared by the [place] FDA and has been authorized for detection and/or diagnosis of SARS-CoV-2 by FDA under an Emergency Use Authorization (EUA).  This EUA will remain in effect (meaning this test can be used) for the duration of the COVID-19 declaration under Section 564(b)(1) of the Act, 21 U.S.C. section 360bbb-3(b)(1), unless the authorization is terminated or revoked sooner.  Performed at [facility], [address] [place],  [address]     Coagulation Studies: Recent Labs    07/02/22 0752  LABPROT 18.2*  INR 1.5*     Urinalysis: No results for input(s): "COLORURINE", "LABSPEC", "PHURINE", "GLUCOSEU", "HGBUR", "BILIRUBINUR", "KETONESUR", "PROTEINUR", "UROBILINOGEN", "NITRITE", "LEUKOCYTESUR" in the last 72 hours.  Invalid input(s): "APPERANCEUR"    Imaging: No results found.   Medications:    anticoagulant sodium citrate     cefTRIAXone (ROCEPHIN)  IV Stopped (07/04/22 2956)   metronidazole Stopped (07/04/22 0133)    acidophilus  1 capsule Oral Daily   amLODipine  10 mg Oral QHS   vitamin C  500 mg Oral BID   aspirin EC  81 mg Oral Daily    atorvastatin  20 mg Oral QHS   Chlorhexidine Gluconate Cloth  6 each Topical Q0600   feeding supplement (NEPRO CARB STEADY)  237 mL Oral BID BM   gabapentin  100 mg Oral TID   heparin injection (subcutaneous)  5,000 Units Subcutaneous Q8H   HYDROcodone-acetaminophen  1 tablet Oral UD   influenza vac split quadrivalent PF  0.5 mL Intramuscular Tomorrow-1000   lipase/protease/amylase  72,000 Units Oral TID AC   mirtazapine  7.5 mg Oral QHS   multivitamin  1 tablet Oral QHS   pantoprazole  40 mg Oral Daily   sevelamer carbonate  800 mg Oral QID   zinc sulfate  220 mg Oral Daily   acetaminophen **OR** acetaminophen, albuterol, alteplase, anticoagulant sodium citrate, heparin, HYDROmorphone (DILAUDID) injection, hydrOXYzine, lidocaine (PF), lidocaine-prilocaine, lipase/protease/amylase, meclizine, ondansetron **OR** ondansetron (ZOFRAN) IV, pentafluoroprop-tetrafluoroeth, phenol, promethazine  Assessment/ Plan:  [name] is a 61 y.o.  female with past medical history of anemia, asthma, diabetes, CAD, PAD, GERD, and end stage renal disease on dialysis.  Patient arrived to  emergency department via EMS after being found at home by grandson with multiple space heaters deterrent to high setting and patient wrapped in heated blanket.  Patient has been admitted for Colitis [K52.9] Acute colitis [K52.9] Heat exposure, initial encounter [T67.9XXA] Fever, unspecified fever cause [R50.9] Sepsis, due to unspecified organism, unspecified whether acute organ dysfunction present ([place]) [A41.9]  [facility] [place]/TTS/Rt Permcath  End-stage renal disease on hemodialysis.  Will maintain outpatient schedule if possible.  Received dialysis yesterday, UF 1 L achieved.  Next treatment scheduled on Saturday.   2. Anemia of chronic kidney disease Lab Results  Component Value Date   HGB 9.6 (L) 07/02/2022    Patient receives [place] outpatient. Hgb remains at goal  3. Secondary Hyperparathyroidism: with  outpatient labs: PTH 485, phosphorus 3.9, calcium 8.5 on 06/03/22.   Lab Results  Component Value Date   PTH 357 (H) 10/05/2018   CALCIUM 7.6 (L) 07/02/2022   CAION 1.09 (L) 04/13/2020   PHOS 4.9 (H) 12/03/2021   Calcium slightly decreased and phosphorus at target.  Continue Sevelamer.   4. Diabetes mellitus type II with chronic kidney disease/renal manifestations:noninsulin dependent. Most recent hemoglobin A1c is 4.1 on 07/02/22.   Glucose well managed.  5.  Hypertension with chronic kidney disease.  Home regimen includes amlodipine, furosemide and hydralazine.  Currently receiving amlodipine only.  Blood pressure 135/60, stable.   LOS: 2   10/13/202311:13 AM

## 2022-07-04 NOTE — Plan of Care (Signed)

## 2022-07-04 NOTE — Progress Notes (Signed)
Patient refused to take PM meds this evening.Stating that she had already taken them today and was not suppose to retake.  Explained what meds ordered for this evening . She was adamant she had already taken them or they were being given at the wrong time.

## 2022-07-04 NOTE — TOC Progression Note (Addendum)
Transition of Care [facility]) - Progression Note    Patient Details  Name: [name] MRN: [id number] Date of Birth: December 09, 1960  Transition of Care [facility]) CM/SW [place], [place] Phone Number: 07/04/2022, 4:14 PM  Clinical Narrative:      This patient will require SNF. SW providing patient with food resources.   SW completing Fl2 and PASSR. Fl2 and referrals sent to SNF. Patient has agreed to SNF.    Expected Discharge Plan and Services        SNF                                         Social Determinants of Health (SDOH) Interventions    Readmission Risk Interventions    05/03/2021    3:02 PM 12/11/2020    9:23 AM 09/21/2020    9:39 AM  Readmission Risk Prevention Plan  Transportation Screening Complete Complete Complete  Medication Review Press photographer) Complete Complete Complete  PCP or Specialist appointment within 3-5 days of discharge  Complete Complete  HRI or Home Care Consult  Complete Complete  SW Recovery Care/Counseling Consult  Complete Complete  Palliative Care Screening Complete Not Applicable Complete  [place]  Not Complete Complete  SNF Comments  working on placement

## 2022-07-04 NOTE — Progress Notes (Addendum)
PROGRESS NOTE    [name]  TGG:[id number] DOB: [birth year] DOA: 07/01/2022 PCP: Denton Lank, MD    Brief Narrative:   [name] is a 61 y.o. female with medical history significant for end-stage renal disease on hemodialysis chronic diastolic heart failure, COPD, diabetes mellitus, GERD presented to the hospital provides.  Patient was initially cooled due to initial suspicion for hyperthermia since she had turned her furnace up at home.  Oral temp with EMS was 103 Fahrenheit. In the ED, patient endorsed abdominal pain but denied nausea or vomiting or diarrhea.  She does not produce urine.  Is mildly somnolent.  Initial temperature in the ED was 102.3 F.  WBC was 9.3 with procalcitonin of 0.6 and lactate of 1.2.   An arterial blood gas revealed normal pH with PCO2 of 53.  Blood glucose was 45 on arrival but did improve on its own to 82.  CT head was negative for acute findings.  CT scan of the abdomen showed mild colonic wall thickening involving the descending, sigmoid,and to a lesser extent ascending colon with associated pericolonic edema, suspicious for colitis.  Patient was then admitted hospital for further evaluation and treatment.  During hospitalization, patient had poor intake and had episodes of hypoglycemia and required D10 infusion.  She underwent the hemodialysis as per the schedule.  Patient's son is concerned about her weakness hypoglycemia but patient wishes to go home.  Physical therapy has however recommended skilled nursing facility placement at this time.    Assessment and Plan:  Principal Problem:   Acute colitis Active Problems:   Hypoglycemia in type 2 diabetes mellitus , without long-term current use of insulin (HCC)   Anemia in ESRD (end-stage renal disease) (HCC)   End-stage renal disease on hemodialysis (HCC)   HTN (hypertension)   Chronic diastolic CHF (congestive heart failure) ([place])   Debility   Acute colitis Patient presented with abdominal pain fever  diarrhea.  Overall improved with mild nausea.  CT scan of the abdomen pelvis showed thickening of the colon suspicious for colitis.  On IV Rocephin and Flagyl , continue supportive care including antiemetics.  Latest WBC at 12.8. Blood cultures negative in less than 3 days.  Procalcitonin 0.6.  Lactate of 1.2.  Lipase of 23.  Temperature max of 99.2 F. Sepsis ruled out.   Hypoglycemia in type 2 diabetes mellitus , without long-term current use of insulin (HCC) Blood glucose level was 45 on presentation.  Lowest blood glucose level was 23.  Currently on D10 due to ongoing hypoglycemia.  We will discontinue D10 today to see how she does.  Encouraged oral intake but is still on broth and not taking much.  Patient has been advanced to solid consistency diet.  States that she used to have a lot of hypoglycemia at home also and was taken off insulin.  Anemia in ESRD (end-stage renal disease) (HCC) Hemoglobin at baseline.  Last hemoglobin of 9.6    End-stage renal disease on full-time today hemodialysis [facility]) On Tuesday Thursday Saturday schedule.   Nephrology on board.   HTN (hypertension) Soft blood pressure on presentation.  On amlodipine and hydralazine at home.  Has been resumed on amlodipine.  COPD/chronic diastolic CHF with chronic hypoxic respiratory failure. Euvolemic at this time.  Getting hemodialysis.  Medication reconciliation shows Lasix 40 daily at home.  Patient wears oxygen by nasal cannula at 2 L at home.  Pressure injury.  Present on admission.  Continue wound care.  Pressure Injury 04/29/22  Toe (Comment  which one) Anterior;Left Callus over bed of wound, pale (Active)  04/29/22 1906  Location: Toe (Comment  which one) (left 1st toe ulcer)  Location Orientation: Anterior;Left  Staging:   Wound Description (Comments): Callus over bed of wound, pale  Present on Admission: Yes     Pressure Injury 07/02/22 Buttocks Bilateral Deep Tissue Pressure Injury - Purple or maroon localized  area of discolored intact skin or blood-filled blister due to damage of underlying soft tissue from pressure and/or shear. Foam placed (Active)  07/02/22 2039  Location: Buttocks  Location Orientation: Bilateral  Staging: Deep Tissue Pressure Injury - Purple or maroon localized area of discolored intact skin or blood-filled blister due to damage of underlying soft tissue from pressure and/or shear.  Wound Description (Comments): Foam placed  Present on Admission: Yes       DVT prophylaxis: heparin injection 5,000 Units Start: 07/02/22 0600   Code Status:     Code Status: Full Code  Disposition: Skilled nursing facility as per PT evaluation.  Patient's son wishes to talk this with her mom.    Status is: Inpatient  Remains inpatient appropriate because: Need for rehabitation, hypoglycemia on D10 infusion, dialysis, IV antibiotics   Family Communication:  Communicated with the patient's son on the phone twice today and updated him about the clinical condition of the patient.  TOC has been consulted  Consultants:  Nephrology  Procedures:  Hemodialysis  Antimicrobials:  Rocephin and Flagyl 07/02/2022>  Anti-infectives (From admission, onward)    Start     Dose/Rate Route Frequency Ordered Stop   07/03/22 0100  cefTRIAXone (ROCEPHIN) 2 g in sodium chloride 0.9 % 100 mL IVPB        2 g 200 mL/hr over 30 Minutes Intravenous Every 24 hours 07/02/22 0038     07/02/22 1300  metroNIDAZOLE (FLAGYL) IVPB 500 mg        500 mg 100 mL/hr over 60 Minutes Intravenous Every 12 hours 07/02/22 0038     07/02/22 0000  cefTRIAXone (ROCEPHIN) 2 g in sodium chloride 0.9 % 100 mL IVPB        2 g 200 mL/hr over 30 Minutes Intravenous  Once 07/01/22 2351 07/02/22 0056   07/02/22 0000  metroNIDAZOLE (FLAGYL) IVPB 500 mg        500 mg 100 mL/hr over 60 Minutes Intravenous  Once 07/01/22 2351 07/02/22 0202       Subjective: Today, patient was seen and examined at bedside.  Wants to go home.   She states that she misses her fish and pets.  Patient's son is concerned about her going home by herself.  Has not been eating well except broth.  Remains on D10 infusion.    Objective: Vitals:   07/03/22 1446 07/03/22 2041 07/04/22 0450 07/04/22 0903  BP: (!) 135/55 (!) 146/62 (!) 150/61 135/60  Pulse: 68 68 61 (!) 57  Resp: '17 18 15 18  '$ Temp: 98.6 F (37 C) 99.2 F (37.3 C) 98.8 F (37.1 C) 98.9 F (37.2 C)  TempSrc:      SpO2: 100% 100% 100% 100%  Weight:      Height:        Intake/Output Summary (Last 24 hours) at 07/04/2022 1259 Last data filed at 07/04/2022 1108 Gross per 24 hour  Intake 1392.51 ml  Output --  Net 1392.51 ml   Filed Weights   07/02/22 2045 07/03/22 0920 07/03/22 1303  Weight: 81.2 kg 80.1 kg 78.3 kg  Physical Examination: Body mass index is 28.29 kg/m.   General: Obese built, not in obvious distress, on nasal cannula oxygen at 2 L/min HENT:   No scleral pallor or icterus noted. Oral mucosa is moist.  Chest:  Clear breath sounds. \ No crackles or wheezes.  CVS: S1 &S2 heard. No murmur.  Regular rate and rhythm. Abdomen: Soft, nontender, nondistended.  Bowel sounds are heard.   Extremities: No cyanosis, clubbing or edema.  Peripheral pulses are palpable.  Left upper extremity fistula in place. Psych: Alert, awake and oriented, normal mood CNS:  No cranial nerve deficits.  Moves all extremities. Skin: Warm and dry.  No rashes noted.  Data Reviewed:   CBC: Recent Labs  Lab 07/01/22 2228 07/02/22 0605  WBC 9.3 12.8*  NEUTROABS 6.5  --   HGB 9.2* 9.6*  HCT 29.0* 29.1*  MCV 90.1 86.4  PLT 95* 101*    Basic Metabolic Panel: Recent Labs  Lab 07/01/22 2228 07/02/22 0605  NA 137 137  K 4.7 3.5  CL 96* 98  CO2 28 27  GLUCOSE 45* 72  BUN 14 17  CREATININE 4.33* 4.56*  CALCIUM 7.9* 7.6*    Liver Function Tests: Recent Labs  Lab 07/01/22 2228  AST 33  ALT 7  ALKPHOS 70  BILITOT 1.0  PROT 6.5  ALBUMIN 3.0*     Radiology  Studies: No results found.    LOS: 2 days    Flora Lipps, MD [facility] Available via Epic secure chat 7am-7pm After these hours, please refer to coverage provider listed on [email or web address] 07/04/2022, 12:59 PM

## 2022-07-05 ENCOUNTER — Encounter: Payer: Self-pay | Admitting: Internal Medicine

## 2022-07-05 LAB — CBC
HCT: 24.1 % — ABNORMAL LOW (ref 36.0–46.0)
Hemoglobin: 7.6 g/dL — ABNORMAL LOW (ref 12.0–15.0)
MCH: 28 pg (ref 26.0–34.0)
MCHC: 31.5 g/dL (ref 30.0–36.0)
MCV: 88.9 fL (ref 80.0–100.0)
Platelets: 99 10*3/uL — ABNORMAL LOW (ref 150–400)
RBC: 2.71 MIL/uL — ABNORMAL LOW (ref 3.87–5.11)
RDW: 14.4 % (ref 11.5–15.5)
WBC: 4 10*3/uL (ref 4.0–10.5)
nRBC: 0 % (ref 0.0–0.2)

## 2022-07-05 LAB — IRON: Iron: 60 ug/dL (ref 28–170)

## 2022-07-05 LAB — GLUCOSE, CAPILLARY
Glucose-Capillary: 105 mg/dL — ABNORMAL HIGH (ref 70–99)
Glucose-Capillary: 65 mg/dL — ABNORMAL LOW (ref 70–99)
Glucose-Capillary: 84 mg/dL (ref 70–99)
Glucose-Capillary: 90 mg/dL (ref 70–99)
Glucose-Capillary: 96 mg/dL (ref 70–99)

## 2022-07-05 LAB — BASIC METABOLIC PANEL
Anion gap: 8 (ref 5–15)
BUN: 17 mg/dL (ref 8–23)
CO2: 28 mmol/L (ref 22–32)
Calcium: 7.7 mg/dL — ABNORMAL LOW (ref 8.9–10.3)
Chloride: 97 mmol/L — ABNORMAL LOW (ref 98–111)
Creatinine, Ser: 4.68 mg/dL — ABNORMAL HIGH (ref 0.44–1.00)
GFR, Estimated: 10 mL/min — ABNORMAL LOW (ref >60)
Glucose, Bld: 86 mg/dL (ref 70–99)
Potassium: 3.1 mmol/L — ABNORMAL LOW (ref 3.5–5.1)
Sodium: 133 mmol/L — ABNORMAL LOW (ref 135–145)

## 2022-07-05 LAB — HEPATITIS C ANTIBODY: HCV Ab: NONREACTIVE

## 2022-07-05 LAB — HEPATITIS B SURFACE ANTIBODY,QUALITATIVE: Hep B S Ab: REACTIVE — AB

## 2022-07-05 LAB — MAGNESIUM: Magnesium: 1.9 mg/dL (ref 1.7–2.4)

## 2022-07-05 LAB — HEPATITIS B SURFACE ANTIGEN: Hepatitis B Surface Ag: NONREACTIVE

## 2022-07-05 LAB — HEPATITIS B CORE ANTIBODY, TOTAL: Hep B Core Total Ab: REACTIVE — AB

## 2022-07-05 MED ORDER — HEPARIN SODIUM (PORCINE) 1000 UNIT/ML IJ SOLN
INTRAMUSCULAR | Status: AC
  Filled 2022-07-05: qty 10

## 2022-07-05 NOTE — Progress Notes (Signed)
PT did 3.25 hrs of HD, tolerated well with no signs of distress  UF = 1000 Cvc capped and heplocked Report given to floor rn    07/05/22 1200  Vitals  Temp Source Oral  BP Location Left Arm  BP Method Automatic  Patient Position (if appropriate) Lying  Pulse Rate Source Monitor

## 2022-07-05 NOTE — Progress Notes (Signed)
[facility]  ROUNDING NOTE   Subjective:   [name] is a 61 y.o., female with past medical history of anemia, asthma, diabetes, CAD, PAD, GERD, and end stage renal disease on dialysis.  Patient arrived to emergency department via EMS after being found at home by grandson with multiple space heaters deterrent to high setting and patient wrapped in heated blanket.  Patient has been admitted for Colitis [K52.9] Acute colitis [K52.9] Heat exposure, initial encounter [T67.9XXA] Fever, unspecified fever cause [R50.9] Sepsis, due to unspecified organism, unspecified whether acute organ dysfunction present [facility]) [A41.9]  Patient is known to our practice and receives outpatient dialysis treatments at [facility] on a TTS schedule, supervised by Dr. Holley Raring.    Patient was seen today in the dialysis unit Patient was resting comfortably  Objective:  Vital signs in last 24 hours:  Temp:  [98.1 F (36.7 C)-98.9 F (37.2 C)] 98.1 F (36.7 C) (10/13 2055) Pulse Rate:  [57-62] 62 (10/13 2055) Resp:  [18] 18 (10/13 2055) BP: (135-150)/(60-67) 150/67 (10/13 2055) SpO2:  [100 %] 100 % (10/13 2055)  Weight change:  Filed Weights   07/02/22 2045 07/03/22 0920 07/03/22 1303  Weight: 81.2 kg 80.1 kg 78.3 kg    Intake/Output: I/O last 3 completed shifts: In: 1392.5 [P.O.:240; I.V.:652.5; IV Piggyback:500] Out: 1000 [Other:1000]   Intake/Output this shift:  No intake/output data recorded.  Physical Exam: General: NAD  Head: Normocephalic, atraumatic. Moist oral mucosal membranes  Eyes: Anicteric  Lungs:  Clear to auscultation. Normal effort, [place] O2  Heart: Regular rate and rhythm  Abdomen:  Soft, nontender  Extremities:  trace peripheral edema.  Neurologic: Nonfocal, moving all four extremities  Skin: No lesions, dry  Access: Rt permcath    Basic Metabolic Panel: Recent Labs  Lab 07/01/22 2228 07/02/22 0605  NA 137 137  K 4.7 3.5  CL 96* 98  CO2 28 27   GLUCOSE 45* 72  BUN 14 17  CREATININE 4.33* 4.56*  CALCIUM 7.9* 7.6*    Liver Function Tests: Recent Labs  Lab 07/01/22 2228  AST 33  ALT 7  ALKPHOS 70  BILITOT 1.0  PROT 6.5  ALBUMIN 3.0*   Recent Labs  Lab 07/01/22 2228  LIPASE 23   No results for input(s): "AMMONIA" in the last 168 hours.  CBC: Recent Labs  Lab 07/01/22 2228 07/02/22 0605  WBC 9.3 12.8*  NEUTROABS 6.5  --   HGB 9.2* 9.6*  HCT 29.0* 29.1*  MCV 90.1 86.4  PLT 95* 101*    Cardiac Enzymes: Recent Labs  Lab 07/01/22 2228  CKTOTAL 63    BNP: Invalid input(s): "POCBNP"  CBG: Recent Labs  Lab 07/04/22 0904 07/04/22 1222 07/04/22 1817 07/04/22 2352 07/05/22 0554  GLUCAP 115* 99 78 99 90    Microbiology: Results for orders placed or performed during the hospital encounter of 07/01/22  Culture, blood (Routine x 2)     Status: None (Preliminary result)   Collection Time: 07/01/22 11:14 PM   Specimen: BLOOD  Result Value Ref Range Status   Specimen Description BLOOD BLOOD RIGHT HAND  Final   Special Requests   Final    BOTTLES DRAWN AEROBIC ONLY Blood Culture adequate volume   Culture   Final    NO GROWTH 3 DAYS Performed at [facility], [address], [place], [place] [address]    Report Status PENDING  Incomplete  SARS Coronavirus 2 by RT PCR (hospital order, performed in [place] hospital  lab) *cepheid single result test* Anterior Nasal Swab     Status: None   Collection Time: 07/02/22 12:56 PM   Specimen: Anterior Nasal Swab  Result Value Ref Range Status   SARS Coronavirus 2 by RT PCR NEGATIVE NEGATIVE Final    Comment: (NOTE) SARS-CoV-2 target nucleic acids are NOT DETECTED.  The SARS-CoV-2 RNA is generally detectable in upper and lower respiratory specimens during the acute phase of infection. The lowest concentration of SARS-CoV-2 viral copies this assay can detect is 250 copies / mL. A negative result does not preclude SARS-CoV-2 infection and should  not be used as the sole basis for treatment or other patient management decisions.  A negative result may occur with improper specimen collection / handling, submission of specimen other than nasopharyngeal swab, presence of viral mutation(s) within the areas targeted by this assay, and inadequate number of viral copies (<250 copies / mL). A negative result must be combined with clinical observations, patient history, and epidemiological information.  Fact Sheet for Patients:   https://www.patel.info/  Fact Sheet for Healthcare Providers: https://hall.com/  This test is not yet approved or  cleared by the [place] FDA and has been authorized for detection and/or diagnosis of SARS-CoV-2 by FDA under an Emergency Use Authorization (EUA).  This EUA will remain in effect (meaning this test can be used) for the duration of the COVID-19 declaration under Section 564(b)(1) of the Act, 21 U.S.C. section 360bbb-3(b)(1), unless the authorization is terminated or revoked sooner.  Performed at [facility], [address] [place], [place] [address]     Coagulation Studies: Recent Labs    07/02/22 0752  LABPROT 18.2*  INR 1.5*    Urinalysis: No results for input(s): "COLORURINE", "LABSPEC", "PHURINE", "GLUCOSEU", "HGBUR", "BILIRUBINUR", "KETONESUR", "PROTEINUR", "UROBILINOGEN", "NITRITE", "LEUKOCYTESUR" in the last 72 hours.  Invalid input(s): "APPERANCEUR"    Imaging: No results found.   Medications:    anticoagulant sodium citrate     cefTRIAXone (ROCEPHIN)  IV 2 g (07/05/22 0139)   metronidazole 500 mg (07/05/22 0222)    acidophilus  1 capsule Oral Daily   amLODipine  10 mg Oral QHS   vitamin C  500 mg Oral BID   aspirin EC  81 mg Oral Daily   atorvastatin  20 mg Oral QHS   Chlorhexidine Gluconate Cloth  6 each Topical Q0600   feeding supplement (NEPRO CARB STEADY)  237 mL Oral BID BM   gabapentin  100 mg Oral  TID   heparin injection (subcutaneous)  5,000 Units Subcutaneous Q8H   HYDROcodone-acetaminophen  1 tablet Oral UD   influenza vac split quadrivalent PF  0.5 mL Intramuscular Tomorrow-1000   lipase/protease/amylase  72,000 Units Oral TID AC   mirtazapine  7.5 mg Oral QHS   multivitamin  1 tablet Oral QHS   pantoprazole  40 mg Oral Daily   sevelamer carbonate  800 mg Oral QID   zinc sulfate  220 mg Oral Daily   acetaminophen **OR** acetaminophen, albuterol, alteplase, anticoagulant sodium citrate, heparin, HYDROmorphone (DILAUDID) injection, hydrOXYzine, lidocaine (PF), lidocaine-prilocaine, lipase/protease/amylase, meclizine, ondansetron **OR** ondansetron (ZOFRAN) IV, pentafluoroprop-tetrafluoroeth, phenol, promethazine  Assessment/ Plan:  Ms. KEYLE DOBY is a 61 y.o.  female with past medical history of anemia, asthma, diabetes, CAD, PAD, GERD, and end stage renal disease on dialysis.  Patient arrived to emergency department via EMS after being found at home by grandson with multiple space heaters deterrent to high setting and patient wrapped in heated blanket.  Patient has been  admitted for Colitis [K52.9] Acute colitis [K52.9] Heat exposure, initial encounter [T67.9XXA] Fever, unspecified fever cause [R50.9] Sepsis, due to unspecified organism, unspecified whether acute organ dysfunction present ([place]) [A41.9]  [facility]/TTS/Rt Permcath  End-stage renal disease on hemodialysis.  Will maintain outpatient schedule if possible.    Patient is to receive renal placement therapy today/Saturday.   2. Anemia of chronic kidney disease Lab Results  Component Value Date   HGB 9.6 (L) 07/02/2022    Patient receives [facility] outpatient. Hgb remains at goal  3. Secondary Hyperparathyroidism: with outpatient labs: PTH 485, phosphorus 3.9, calcium 8.5 on 06/03/22.   Lab Results  Component Value Date   PTH 357 (H) 10/05/2018   CALCIUM 7.6 (L) 07/02/2022   CAION 1.09 (L) 04/13/2020    PHOS 4.9 (H) 12/03/2021   Calcium slightly decreased and phosphorus at target.  Continue Sevelamer.   4. Diabetes mellitus type II with chronic kidney disease/renal manifestations:noninsulin dependent. Most recent hemoglobin A1c is 4.1 on 07/02/22.   Glucose well managed.  5.  Hypertension with chronic kidney disease.  Home regimen includes amlodipine, furosemide and hydralazine.  Currently receiving amlodipine only.  Blood pressure 135/60, stable.    Addendum Patient was seen in the dialysis unit Patient tolerating treatment well   LOS: 3 Emerita Berkemeier s [facility] 10/14/20236:22 AM

## 2022-07-05 NOTE — Plan of Care (Signed)

## 2022-07-05 NOTE — Plan of Care (Signed)
Minimal change in ecchymosis on extremities.  Sacral PI decrease in redness

## 2022-07-05 NOTE — Progress Notes (Addendum)
Progress Note    [name]  KDT:[id number] DOB: 1961/04/03  DOA: 07/01/2022 PCP: Denton Lank, MD      Brief Narrative:    Medical records reviewed and are as summarized below:   [name] is a 61 y.o. female with medical history significant for end-stage renal disease on hemodialysis chronic diastolic heart failure, COPD, diabetes mellitus, GERD presented to the hospital provides.  Patient was initially cooled due to initial suspicion for hyperthermia since she had turned her furnace up at home..  Oral temp with EMS was 103.  Patient in the ED endorsed abdominal pain but denied nausea or vomiting or diarrhea.  She does not produce urine.  Is mildly somnolent.  Initial temperature in the ED was 102.3 F.  WBC was 9.3 with procalcitonin of 0.6 and lactate of 1.2.   An arterial blood gas revealed normal pH with PCO2 of 53.  Blood glucose was 45 on arrival but did improve on its own to 82.  CT head was negative for acute findings.  CT scan of the abdomen showed Mild colonic wall thickening involving the descending, sigmoid,and to a lesser extent ascending colon with associated pericolonic edema, suspicious for colitis.  Patient was then admitted hospital for further evaluation and treatment.        Assessment/Plan:   Principal Problem:   Acute colitis Active Problems:   Hypoglycemia in type 2 diabetes mellitus , without long-term current use of insulin (HCC)   Anemia in ESRD (end-stage renal disease) (HCC)   End-stage renal disease on hemodialysis (HCC)   HTN (hypertension)   Chronic diastolic CHF (congestive heart failure) ([place])   Debility   Nutrition Problem: Increased nutrient needs Etiology: wound healing  Signs/Symptoms: estimated needs   Body mass index is 29.3 kg/m.   Acute colitis Continue IV ceftriaxone and Flagyl.  Plan to switch to oral antibiotics tomorrow.   Hypoglycemia in type 2 diabetes mellitus , without long-term current use of insulin  (HCC) Blood glucose level was 45 on presentation.  Lowest blood glucose level was 23.  Glucose levels appears to have stabilized.  She used to be on insulin in the past but her PCP had taken her off of insulin because of hypoglycemic episodes.   Anemia in ESRD (end-stage renal disease) ([place]) Drop in hemoglobin noted.  I will defer treatment to nephrologist.   End-stage renal disease on full-time today hemodialysis [facility]) On Tuesday Thursday Saturday schedule.  She had hemodialysis today.  Follow-up with nephrologist.  HTN (hypertension) Continue amlodipine   COPD/chronic diastolic CHF with chronic hypoxic respiratory failure. Compensated.  She uses 2 L/min oxygen at baseline.   Pressure injury.  Present on admission.  Continue wound care.        Pressure Injury 07/02/22 Buttocks Bilateral Deep Tissue Pressure Injury - Purple or maroon localized area of discolored intact skin or blood-filled blister due to damage of underlying soft tissue from pressure and/or shear. Foam placed (Active)  07/02/22 2039  Location: Buttocks  Location Orientation: Bilateral  Staging: Deep Tissue Pressure Injury - Purple or maroon localized area of discolored intact skin or blood-filled blister due to damage of underlying soft tissue from pressure and/or shear.  Wound Description (Comments): Foam placed  Present on Admission: Yes       Diet Order             Diet Carb Modified Fluid consistency: Thin; Room service appropriate? Yes  Diet effective midnight  Consultants: Nephrologist  Procedures: None    Medications:    acidophilus  1 capsule Oral Daily   amLODipine  10 mg Oral QHS   vitamin C  500 mg Oral BID   aspirin EC  81 mg Oral Daily   atorvastatin  20 mg Oral QHS   Chlorhexidine Gluconate Cloth  6 each Topical Q0600   feeding supplement (NEPRO CARB STEADY)  237 mL Oral BID BM   gabapentin  100 mg Oral TID   heparin injection (subcutaneous)   5,000 Units Subcutaneous Q8H   heparin sodium (porcine)       HYDROcodone-acetaminophen  1 tablet Oral UD   influenza vac split quadrivalent PF  0.5 mL Intramuscular Tomorrow-1000   lipase/protease/amylase  72,000 Units Oral TID AC   mirtazapine  7.5 mg Oral QHS   multivitamin  1 tablet Oral QHS   pantoprazole  40 mg Oral Daily   sevelamer carbonate  800 mg Oral QID   zinc sulfate  220 mg Oral Daily   Continuous Infusions:  anticoagulant sodium citrate     cefTRIAXone (ROCEPHIN)  IV 2 g (07/05/22 0139)   metronidazole 500 mg (07/05/22 1310)     Anti-infectives (From admission, onward)    Start     Dose/Rate Route Frequency Ordered Stop   07/03/22 0100  cefTRIAXone (ROCEPHIN) 2 g in sodium chloride 0.9 % 100 mL IVPB        2 g 200 mL/hr over 30 Minutes Intravenous Every 24 hours 07/02/22 0038     07/02/22 1300  metroNIDAZOLE (FLAGYL) IVPB 500 mg        500 mg 100 mL/hr over 60 Minutes Intravenous Every 12 hours 07/02/22 0038     07/02/22 0000  cefTRIAXone (ROCEPHIN) 2 g in sodium chloride 0.9 % 100 mL IVPB        2 g 200 mL/hr over 30 Minutes Intravenous  Once 07/01/22 2351 07/02/22 0056   07/02/22 0000  metroNIDAZOLE (FLAGYL) IVPB 500 mg        500 mg 100 mL/hr over 60 Minutes Intravenous  Once 07/01/22 2351 07/02/22 0202              Family Communication/Anticipated D/C date and plan/Code Status   DVT prophylaxis: heparin injection 5,000 Units Start: 07/02/22 0600     Code Status: Full Code  Family Communication: None Disposition Plan: Plan to discharge to SNF in 2 to 3 days   Status is: Inpatient Remains inpatient appropriate because: Awaiting placement to SNF       Subjective:   Interval events noted.  She has a sensation of gas in her abdomen.  No abdominal pain, vomiting or diarrhea.  Objective:    Vitals:   07/05/22 1130 07/05/22 1145 07/05/22 1200 07/05/22 1316  BP: (!) 142/54 (!) 146/58  (!) 157/60  Pulse: 60 (!) 58  66  Resp: '12 10   17  '$ Temp:   98 F (36.7 C) 97.7 F (36.5 C)  TempSrc:   Oral   SpO2:    100%  Weight:      Height:       No data found.   Intake/Output Summary (Last 24 hours) at 07/05/2022 1456 Last data filed at 07/05/2022 1200 Gross per 24 hour  Intake 0 ml  Output 1000 ml  Net -1000 ml   Filed Weights   07/03/22 0920 07/03/22 1303 07/05/22 0909  Weight: 80.1 kg 78.3 kg 81.1 kg    Exam:  GEN: NAD SKIN: Chronic  skin changes with nodularity on bilateral legs EYES: No pallor or icterus ENT: MMM CV: RRR PULM: CTA B ABD: soft, ND, NT, +BS CNS: AAO x 3, non focal EXT: Bilateral leg edema,? lymphedema    Data Reviewed:   I have personally reviewed following labs and imaging studies:  Labs: Labs show the following:   Basic Metabolic Panel: Recent Labs  Lab 07/01/22 2228 07/02/22 0605 07/05/22 0847 07/05/22 0848  NA 137 137  --  133*  K 4.7 3.5  --  3.1*  CL 96* 98  --  97*  CO2 28 27  --  28  GLUCOSE 45* 72  --  86  BUN 14 17  --  17  CREATININE 4.33* 4.56*  --  4.68*  CALCIUM 7.9* 7.6*  --  7.7*  MG  --   --  1.9  --    GFR Estimated Creatinine Clearance: 13.1 mL/min (A) (by C-G formula based on SCr of 4.68 mg/dL (H)). Liver Function Tests: Recent Labs  Lab 07/01/22 2228  AST 33  ALT 7  ALKPHOS 70  BILITOT 1.0  PROT 6.5  ALBUMIN 3.0*   Recent Labs  Lab 07/01/22 2228  LIPASE 23   No results for input(s): "AMMONIA" in the last 168 hours. Coagulation profile Recent Labs  Lab 07/02/22 0752  INR 1.5*    CBC: Recent Labs  Lab 07/01/22 2228 07/02/22 0605 07/05/22 0848  WBC 9.3 12.8* 4.0  NEUTROABS 6.5  --   --   HGB 9.2* 9.6* 7.6*  HCT 29.0* 29.1* 24.1*  MCV 90.1 86.4 88.9  PLT 95* 101* 99*   Cardiac Enzymes: Recent Labs  Lab 07/01/22 2228  CKTOTAL 63   BNP (last 3 results) No results for input(s): "PROBNP" in the last 8760 hours. CBG: Recent Labs  Lab 07/04/22 1817 07/04/22 2352 07/05/22 0554 07/05/22 1319 07/05/22 1444   GLUCAP 78 99 90 65* 84   D-Dimer: No results for input(s): "DDIMER" in the last 72 hours. Hgb A1c: No results for input(s): "HGBA1C" in the last 72 hours. Lipid Profile: No results for input(s): "CHOL", "HDL", "LDLCALC", "TRIG", "CHOLHDL", "LDLDIRECT" in the last 72 hours. Thyroid function studies: No results for input(s): "TSH", "T4TOTAL", "T3FREE", "THYROIDAB" in the last 72 hours.  Invalid input(s): "FREET3" Anemia work up: Recent Labs    07/05/22 0848  IRON 60   Sepsis Labs: Recent Labs  Lab 07/01/22 2228 07/01/22 2314 07/02/22 0605 07/02/22 0752 07/05/22 0848  PROCALCITON 0.63  --   --   --   --   WBC 9.3  --  12.8*  --  4.0  LATICACIDVEN  --  1.2  --  1.5  --     Microbiology Recent Results (from the past 240 hour(s))  Culture, blood (Routine x 2)     Status: None (Preliminary result)   Collection Time: 07/01/22 11:14 PM   Specimen: BLOOD  Result Value Ref Range Status   Specimen Description BLOOD BLOOD RIGHT HAND  Final   Special Requests   Final    BOTTLES DRAWN AEROBIC ONLY Blood Culture adequate volume   Culture   Final    NO GROWTH 4 DAYS Performed at [facility], [address] [place], [place] [address]    Report Status PENDING  Incomplete  SARS Coronavirus 2 by RT PCR (hospital order, performed in [facility] hospital lab) *cepheid single result test* Anterior Nasal Swab     Status: None   Collection Time: 07/02/22 12:56 PM  Specimen: Anterior Nasal Swab  Result Value Ref Range Status   SARS Coronavirus 2 by RT PCR NEGATIVE NEGATIVE Final    Comment: (NOTE) SARS-CoV-2 target nucleic acids are NOT DETECTED.  The SARS-CoV-2 RNA is generally detectable in upper and lower respiratory specimens during the acute phase of infection. The lowest concentration of SARS-CoV-2 viral copies this assay can detect is 250 copies / mL. A negative result does not preclude SARS-CoV-2 infection and should not be used as the sole basis for treatment or  other patient management decisions.  A negative result may occur with improper specimen collection / handling, submission of specimen other than nasopharyngeal swab, presence of viral mutation(s) within the areas targeted by this assay, and inadequate number of viral copies (<250 copies / mL). A negative result must be combined with clinical observations, patient history, and epidemiological information.  Fact Sheet for Patients:   https://www.patel.info/  Fact Sheet for Healthcare Providers: https://hall.com/  This test is not yet approved or  cleared by the [place] FDA and has been authorized for detection and/or diagnosis of SARS-CoV-2 by FDA under an Emergency Use Authorization (EUA).  This EUA will remain in effect (meaning this test can be used) for the duration of the COVID-19 declaration under Section 564(b)(1) of the Act, 21 U.S.C. section 360bbb-3(b)(1), unless the authorization is terminated or revoked sooner.  Performed at [facility], [address] [place], [place] [address]     Procedures and diagnostic studies:  No results found.             LOS: 3 days   Zani Kyllonen  [facility]   Pager on [email or web address]. If 7PM-7AM, please contact night-coverage at [email or web address]     07/05/2022, 2:56 PM

## 2022-07-06 LAB — GLUCOSE, CAPILLARY
Glucose-Capillary: 102 mg/dL — ABNORMAL HIGH (ref 70–99)
Glucose-Capillary: 106 mg/dL — ABNORMAL HIGH (ref 70–99)
Glucose-Capillary: 109 mg/dL — ABNORMAL HIGH (ref 70–99)
Glucose-Capillary: 115 mg/dL — ABNORMAL HIGH (ref 70–99)
Glucose-Capillary: 88 mg/dL (ref 70–99)
Glucose-Capillary: 94 mg/dL (ref 70–99)
Glucose-Capillary: 97 mg/dL (ref 70–99)

## 2022-07-06 LAB — CULTURE, BLOOD (ROUTINE X 2)
Culture: NO GROWTH
Special Requests: ADEQUATE

## 2022-07-06 LAB — HEPATITIS B SURFACE ANTIBODY, QUANTITATIVE: Hep B S AB Quant (Post): >1000 m[IU]/mL (ref >9.9)

## 2022-07-06 MED ORDER — AMOXICILLIN-POT CLAVULANATE 500-125 MG PO TABS
1.0000 | ORAL_TABLET | Freq: Every day | ORAL | Status: AC
  Administered 2022-07-06 – 2022-07-07 (×2): 1 via ORAL
  Filled 2022-07-06 (×2): qty 1

## 2022-07-06 MED ORDER — PANTOPRAZOLE SODIUM 40 MG PO TBEC
40.0000 mg | DELAYED_RELEASE_TABLET | Freq: Two times a day (BID) | ORAL | Status: DC
  Administered 2022-07-06 – 2022-07-08 (×5): 40 mg via ORAL
  Filled 2022-07-06 (×5): qty 1

## 2022-07-06 MED ORDER — RENA-VITE PO TABS
1.0000 | ORAL_TABLET | Freq: Every day | ORAL | Status: DC
  Administered 2022-07-06 – 2022-07-07 (×2): 1 via ORAL
  Filled 2022-07-06 (×3): qty 1

## 2022-07-06 NOTE — Progress Notes (Signed)
Pt noted to have black bowel movement. Hospitalist notified.

## 2022-07-06 NOTE — Progress Notes (Signed)
       CROSS COVER NOTE  NAME: [name] MRN: [id number] DOB : September 30, 1960    Date of Service   07/06/2022   HPI/Events of Note   Notified of black stool tonight. On review of chart every documented stool since admission has been black and patient has had noted black stool in the past. No documentation of black stool noted in rounders note.   Interventions   Assessment/Plan: Follow up by day team CBC in AM     This document was prepared using Dragon voice recognition software and may include unintentional dictation errors.  Neomia Glass DNP, MBA, FNP-BC Nurse Practitioner [facility] Pager [phone]

## 2022-07-06 NOTE — Progress Notes (Addendum)
Progress Note    ABBAGAIL [name]  MWU:[id number] DOB: 09-May-1961  DOA: 07/01/2022 PCP: Denton Lank, MD      Brief Narrative:    Medical records reviewed and are as summarized below:   [name] is a 61 y.o. female with medical history significant for end-stage renal disease on hemodialysis chronic diastolic heart failure, COPD, diabetes mellitus, GERD presented to the hospital provides.  Patient was initially cooled due to initial suspicion for hyperthermia since she had turned her furnace up at home..  Oral temp with EMS was 103.  Patient in the ED endorsed abdominal pain but denied nausea or vomiting or diarrhea.  She does not produce urine.  Is mildly somnolent.  Initial temperature in the ED was 102.3 F.  WBC was 9.3 with procalcitonin of 0.6 and lactate of 1.2.   An arterial blood gas revealed normal pH with PCO2 of 53.  Blood glucose was 45 on arrival but did improve on its own to 82.  CT head was negative for acute findings.  CT scan of the abdomen showed Mild colonic wall thickening involving the descending, sigmoid,and to a lesser extent ascending colon with associated pericolonic edema, suspicious for colitis.  Patient was then admitted hospital for further evaluation and treatment.        Assessment/Plan:   Principal Problem:   Acute colitis Active Problems:   Hypoglycemia in type 2 diabetes mellitus , without long-term current use of insulin (HCC)   Anemia in ESRD (end-stage renal disease) (HCC)   End-stage renal disease on hemodialysis (HCC)   HTN (hypertension)   Chronic diastolic CHF (congestive heart failure) ([name])   Debility   Nutrition Problem: Increased nutrient needs Etiology: wound healing  Signs/Symptoms: estimated needs   Body mass index is 29.3 kg/m.   Acute colitis, diarrhea Discontinue IV ceftriaxone and Flagyl.  Start Augmentin with plan to complete antibiotics tomorrow.     Hypoglycemia in type 2 diabetes mellitus , without long-term  current use of insulin (HCC) Blood glucose level was 45 on presentation.  Lowest blood glucose level was 23.  Glucose levels appears to have stabilized.  She used to be on insulin in the past but her PCP had taken her off of insulin because of hypoglycemic episodes.   Anemia in ESRD (end-stage renal disease) ([place]) She has been refusing blood work.  She prefers to have her blood drawn during hemodialysis.  I will defer treatment to nephrologist.   End-stage renal disease on full-time today hemodialysis [facility]) On Tuesday Thursday Saturday schedule.  She had hemodialysis today.  Follow-up with nephrologist.  Hypokalemia Defer treatment to nephrologist.  Monitor BMP  HTN (hypertension) Continue amlodipine   COPD/chronic diastolic CHF with chronic hypoxic respiratory failure. Compensated.  She uses 2 L/min oxygen at baseline.   Pressure injury.  Present on admission.  Continue wound care.        Pressure Injury 07/02/22 Buttocks Bilateral Deep Tissue Pressure Injury - Purple or maroon localized area of discolored intact skin or blood-filled blister due to damage of underlying soft tissue from pressure and/or shear. Foam placed (Active)  07/02/22 2039  Location: Buttocks  Location Orientation: Bilateral  Staging: Deep Tissue Pressure Injury - Purple or maroon localized area of discolored intact skin or blood-filled blister due to damage of underlying soft tissue from pressure and/or shear.  Wound Description (Comments): Foam placed  Present on Admission: Yes       She requested the following changes to her  medicines: She said she no longer takes Lipitor at home and wants it to be discontinued.  She also said she prefers to take Rena-Vite in the morning.  She wants to take Protonix twice a day before meals.  Diet Order             Diet Carb Modified Fluid consistency: Thin; Room service appropriate? Yes  Diet effective midnight                             Consultants: Nephrologist  Procedures: None    Medications:    acidophilus  1 capsule Oral Daily   amLODipine  10 mg Oral QHS   amoxicillin-clavulanate  1 tablet Oral Daily   vitamin C  500 mg Oral BID   aspirin EC  81 mg Oral Daily   Chlorhexidine Gluconate Cloth  6 each Topical Q0600   feeding supplement (NEPRO CARB STEADY)  237 mL Oral BID BM   gabapentin  100 mg Oral TID   heparin injection (subcutaneous)  5,000 Units Subcutaneous Q8H   HYDROcodone-acetaminophen  1 tablet Oral UD   influenza vac split quadrivalent PF  0.5 mL Intramuscular Tomorrow-1000   lipase/protease/amylase  72,000 Units Oral TID AC   mirtazapine  7.5 mg Oral QHS   multivitamin  1 tablet Oral Daily   pantoprazole  40 mg Oral BID AC   sevelamer carbonate  800 mg Oral QID   zinc sulfate  220 mg Oral Daily   Continuous Infusions:  anticoagulant sodium citrate       Anti-infectives (From admission, onward)    Start     Dose/Rate Route Frequency Ordered Stop   07/06/22 1000  amoxicillin-clavulanate (AUGMENTIN) 500-125 MG per tablet 1 tablet        1 tablet Oral Daily 07/06/22 0755 07/08/22 0959   07/03/22 0100  cefTRIAXone (ROCEPHIN) 2 g in sodium chloride 0.9 % 100 mL IVPB  Status:  Discontinued        2 g 200 mL/hr over 30 Minutes Intravenous Every 24 hours 07/02/22 0038 07/06/22 0755   07/02/22 1300  metroNIDAZOLE (FLAGYL) IVPB 500 mg  Status:  Discontinued        500 mg 100 mL/hr over 60 Minutes Intravenous Every 12 hours 07/02/22 0038 07/06/22 0755   07/02/22 0000  cefTRIAXone (ROCEPHIN) 2 g in sodium chloride 0.9 % 100 mL IVPB        2 g 200 mL/hr over 30 Minutes Intravenous  Once 07/01/22 2351 07/02/22 0056   07/02/22 0000  metroNIDAZOLE (FLAGYL) IVPB 500 mg        500 mg 100 mL/hr over 60 Minutes Intravenous  Once 07/01/22 2351 07/02/22 0202              Family Communication/Anticipated D/C date and plan/Code Status   DVT prophylaxis: heparin injection  5,000 Units Start: 07/02/22 0600     Code Status: Full Code  Family Communication: None Disposition Plan: Plan to discharge to SNF in 2 to 3 days   Status is: Inpatient Remains inpatient appropriate because: Awaiting placement to SNF       Subjective:   C/o diarrhea. No abdominal pain or vomiting  Objective:    Vitals:   07/05/22 1624 07/05/22 2104 07/06/22 0604 07/06/22 0754  BP: (!) 152/66 (!) 128/42 (!) 137/55 (!) 146/56  Pulse: 63 64 66 64  Resp: '17 18 17 16  '$ Temp: 97.7 F (36.5 C) 98.1 F (36.7  C) 98.1 F (36.7 C) 98.2 F (36.8 C)  TempSrc:    Oral  SpO2: 100% 100% 100% 100%  Weight:      Height:       No data found.   Intake/Output Summary (Last 24 hours) at 07/06/2022 1320 Last data filed at 07/06/2022 0615 Gross per 24 hour  Intake 500.17 ml  Output --  Net 500.17 ml   Filed Weights   07/03/22 0920 07/03/22 1303 07/05/22 0909  Weight: 80.1 kg 78.3 kg 81.1 kg    Exam:  GEN: NAD SKIN: Skin changes with moderate disease on bilateral legs EYES: No pallor or icterus ENT: MMM CV: RRR PULM: CTA B ABD: soft, ND, NT, +BS CNS: AAO x 3, non focal EXT: Bilateral leg edema    Data Reviewed:   I have personally reviewed following labs and imaging studies:  Labs: Labs show the following:   Basic Metabolic Panel: Recent Labs  Lab 07/01/22 2228 07/02/22 0605 07/05/22 0847 07/05/22 0848  NA 137 137  --  133*  K 4.7 3.5  --  3.1*  CL 96* 98  --  97*  CO2 28 27  --  28  GLUCOSE 45* 72  --  86  BUN 14 17  --  17  CREATININE 4.33* 4.56*  --  4.68*  CALCIUM 7.9* 7.6*  --  7.7*  MG  --   --  1.9  --    GFR Estimated Creatinine Clearance: 13.1 mL/min (A) (by C-G formula based on SCr of 4.68 mg/dL (H)). Liver Function Tests: Recent Labs  Lab 07/01/22 2228  AST 33  ALT 7  ALKPHOS 70  BILITOT 1.0  PROT 6.5  ALBUMIN 3.0*   Recent Labs  Lab 07/01/22 2228  LIPASE 23   No results for input(s): "AMMONIA" in the last 168  hours. Coagulation profile Recent Labs  Lab 07/02/22 0752  INR 1.5*    CBC: Recent Labs  Lab 07/01/22 2228 07/02/22 0605 07/05/22 0848  WBC 9.3 12.8* 4.0  NEUTROABS 6.5  --   --   HGB 9.2* 9.6* 7.6*  HCT 29.0* 29.1* 24.1*  MCV 90.1 86.4 88.9  PLT 95* 101* 99*   Cardiac Enzymes: Recent Labs  Lab 07/01/22 2228  CKTOTAL 63   BNP (last 3 results) No results for input(s): "PROBNP" in the last 8760 hours. CBG: Recent Labs  Lab 07/05/22 1826 07/05/22 2348 07/06/22 0620 07/06/22 0855 07/06/22 1226  GLUCAP 105* 96 106* 97 102*   D-Dimer: No results for input(s): "DDIMER" in the last 72 hours. Hgb A1c: No results for input(s): "HGBA1C" in the last 72 hours. Lipid Profile: No results for input(s): "CHOL", "HDL", "LDLCALC", "TRIG", "CHOLHDL", "LDLDIRECT" in the last 72 hours. Thyroid function studies: No results for input(s): "TSH", "T4TOTAL", "T3FREE", "THYROIDAB" in the last 72 hours.  Invalid input(s): "FREET3" Anemia work up: Recent Labs    07/05/22 0848  IRON 60   Sepsis Labs: Recent Labs  Lab 07/01/22 2228 07/01/22 2314 07/02/22 0605 07/02/22 0752 07/05/22 0848  PROCALCITON 0.63  --   --   --   --   WBC 9.3  --  12.8*  --  4.0  LATICACIDVEN  --  1.2  --  1.5  --     Microbiology Recent Results (from the past 240 hour(s))  Culture, blood (Routine x 2)     Status: None   Collection Time: 07/01/22 11:14 PM   Specimen: BLOOD  Result Value Ref Range Status  Specimen Description BLOOD BLOOD RIGHT HAND  Final   Special Requests   Final    BOTTLES DRAWN AEROBIC ONLY Blood Culture adequate volume   Culture   Final    NO GROWTH 5 DAYS Performed at [facility], [address] [place], [place] [address]    Report Status 07/06/2022 FINAL  Final  SARS Coronavirus 2 by RT PCR (hospital order, performed in [facility] hospital lab) *cepheid single result test* Anterior Nasal Swab     Status: None   Collection Time: 07/02/22 12:56 PM    Specimen: Anterior Nasal Swab  Result Value Ref Range Status   SARS Coronavirus 2 by RT PCR NEGATIVE NEGATIVE Final    Comment: (NOTE) SARS-CoV-2 target nucleic acids are NOT DETECTED.  The SARS-CoV-2 RNA is generally detectable in upper and lower respiratory specimens during the acute phase of infection. The lowest concentration of SARS-CoV-2 viral copies this assay can detect is 250 copies / mL. A negative result does not preclude SARS-CoV-2 infection and should not be used as the sole basis for treatment or other patient management decisions.  A negative result may occur with improper specimen collection / handling, submission of specimen other than nasopharyngeal swab, presence of viral mutation(s) within the areas targeted by this assay, and inadequate number of viral copies (<250 copies / mL). A negative result must be combined with clinical observations, patient history, and epidemiological information.  Fact Sheet for Patients:   https://www.patel.info/  Fact Sheet for Healthcare Providers: https://hall.com/  This test is not yet approved or  cleared by the [place] FDA and has been authorized for detection and/or diagnosis of SARS-CoV-2 by FDA under an Emergency Use Authorization (EUA).  This EUA will remain in effect (meaning this test can be used) for the duration of the COVID-19 declaration under Section 564(b)(1) of the Act, 21 U.S.C. section 360bbb-3(b)(1), unless the authorization is terminated or revoked sooner.  Performed at [facility], [address] [place], [place] [address]   Culture, blood (Routine X 2) w Reflex to ID Panel     Status: None (Preliminary result)   Collection Time: 07/05/22  8:45 AM   Specimen: BLOOD  Result Value Ref Range Status   Specimen Description BLOOD HEMODIALYSIS LINE  Final   Special Requests   Final    BOTTLES DRAWN AEROBIC AND ANAEROBIC Blood Culture results may not be  optimal due to an excessive volume of blood received in culture bottles   Culture   Final    NO GROWTH < 24 HOURS Performed at [facility], [address] [place], [place] [address]    Report Status PENDING  Incomplete    Procedures and diagnostic studies:  No results found.             LOS: 4 days   Samaria Anes  [facility]   Pager on [email or web address]. If 7PM-7AM, please contact night-coverage at [email or web address]     07/06/2022, 1:20 PM

## 2022-07-06 NOTE — Plan of Care (Signed)

## 2022-07-06 NOTE — Progress Notes (Signed)
[facility]  ROUNDING NOTE   Subjective:   [name] is a 61 y.o., female with past medical history of anemia, asthma, diabetes, CAD, PAD, GERD, and end stage renal disease on dialysis.  Patient arrived to emergency department via EMS after being found at home by grandson with multiple space heaters deterrent to high setting and patient wrapped in heated blanket.  Patient has been admitted for Colitis [K52.9] Acute colitis [K52.9] Heat exposure, initial encounter [T67.9XXA] Fever, unspecified fever cause [R50.9] Sepsis, due to unspecified organism, unspecified whether acute organ dysfunction present [facility]) [A41.9]  Patient is known to our practice and receives outpatient dialysis treatments at [facility] on a TTS schedule, supervised by Dr. Holley Raring.    Patient was seen today on first floor. Patient offers no new specific physical complaints. Patient main concern in today visit was about the timing of her medication.  Patient went on to explain that however at home she takes Lipitor at night but he is she is not sure what time she is taking it.  I then discussed with the patient regarding her kidney related medications and requested patient to please discuss other medication with her primary team  Objective:  Vital signs in last 24 hours:  Temp:  [97.7 F (36.5 C)-98.2 F (36.8 C)] 98.2 F (36.8 C) (10/15 0754) Pulse Rate:  [58-66] 64 (10/15 0754) Resp:  [0-18] 16 (10/15 0754) BP: (128-157)/(42-77) 146/56 (10/15 0754) SpO2:  [100 %] 100 % (10/15 0754) Weight:  [81.1 kg] 81.1 kg (10/14 0909)  Weight change:  Filed Weights   07/03/22 0920 07/03/22 1303 07/05/22 0909  Weight: 80.1 kg 78.3 kg 81.1 kg    Intake/Output: I/O last 3 completed shifts: In: 500.2 [IV Piggyback:500.2] Out: 1000 [Other:1000]   Intake/Output this shift:  No intake/output data recorded.  Physical Exam: General: NAD  Head: Normocephalic, atraumatic. Moist oral mucosal membranes   Eyes: Anicteric  Lungs:  Clear to auscultation. Normal effort, [place] O2  Heart: Regular rate and rhythm  Abdomen:  Soft, nontender  Extremities:  trace peripheral edema.Chronic venous stasis changes  Neurologic: Nonfocal, moving all four extremities  Skin: No lesions, dry  Access: Rt permcath    Basic Metabolic Panel: Recent Labs  Lab 07/01/22 2228 07/02/22 0605 07/05/22 0847 07/05/22 0848  NA 137 137  --  133*  K 4.7 3.5  --  3.1*  CL 96* 98  --  97*  CO2 28 27  --  28  GLUCOSE 45* 72  --  86  BUN 14 17  --  17  CREATININE 4.33* 4.56*  --  4.68*  CALCIUM 7.9* 7.6*  --  7.7*  MG  --   --  1.9  --     Liver Function Tests: Recent Labs  Lab 07/01/22 2228  AST 33  ALT 7  ALKPHOS 70  BILITOT 1.0  PROT 6.5  ALBUMIN 3.0*   Recent Labs  Lab 07/01/22 2228  LIPASE 23   No results for input(s): "AMMONIA" in the last 168 hours.  CBC: Recent Labs  Lab 07/01/22 2228 07/02/22 0605 07/05/22 0848  WBC 9.3 12.8* 4.0  NEUTROABS 6.5  --   --   HGB 9.2* 9.6* 7.6*  HCT 29.0* 29.1* 24.1*  MCV 90.1 86.4 88.9  PLT 95* 101* 99*    Cardiac Enzymes: Recent Labs  Lab 07/01/22 2228  CKTOTAL 63    BNP: Invalid input(s): "POCBNP"  CBG: Recent Labs  Lab 07/05/22 1319 07/05/22 1444 07/05/22  1826 07/05/22 2348 07/06/22 0620  GLUCAP 65* 84 105* 96 106*    Microbiology: Results for orders placed or performed during the hospital encounter of 07/01/22  Culture, blood (Routine x 2)     Status: None   Collection Time: 07/01/22 11:14 PM   Specimen: BLOOD  Result Value Ref Range Status   Specimen Description BLOOD BLOOD RIGHT HAND  Final   Special Requests   Final    BOTTLES DRAWN AEROBIC ONLY Blood Culture adequate volume   Culture   Final    NO GROWTH 5 DAYS Performed at [facility], [address] [place], [place] [address]    Report Status 07/06/2022 FINAL  Final  SARS Coronavirus 2 by RT PCR (hospital order, performed in [facility] hospital lab)  *cepheid single result test* Anterior Nasal Swab     Status: None   Collection Time: 07/02/22 12:56 PM   Specimen: Anterior Nasal Swab  Result Value Ref Range Status   SARS Coronavirus 2 by RT PCR NEGATIVE NEGATIVE Final    Comment: (NOTE) SARS-CoV-2 target nucleic acids are NOT DETECTED.  The SARS-CoV-2 RNA is generally detectable in upper and lower respiratory specimens during the acute phase of infection. The lowest concentration of SARS-CoV-2 viral copies this assay can detect is 250 copies / mL. A negative result does not preclude SARS-CoV-2 infection and should not be used as the sole basis for treatment or other patient management decisions.  A negative result may occur with improper specimen collection / handling, submission of specimen other than nasopharyngeal swab, presence of viral mutation(s) within the areas targeted by this assay, and inadequate number of viral copies (<250 copies / mL). A negative result must be combined with clinical observations, patient history, and epidemiological information.  Fact Sheet for Patients:   https://www.patel.info/  Fact Sheet for Healthcare Providers: https://hall.com/  This test is not yet approved or  cleared by the [place] FDA and has been authorized for detection and/or diagnosis of SARS-CoV-2 by FDA under an Emergency Use Authorization (EUA).  This EUA will remain in effect (meaning this test can be used) for the duration of the COVID-19 declaration under Section 564(b)(1) of the Act, 21 U.S.C. section 360bbb-3(b)(1), unless the authorization is terminated or revoked sooner.  Performed at [facility], [address] [place], [place] [address]   Culture, blood (Routine X 2) w Reflex to ID Panel     Status: None (Preliminary result)   Collection Time: 07/05/22  8:45 AM   Specimen: BLOOD  Result Value Ref Range Status   Specimen Description BLOOD HEMODIALYSIS LINE   Final   Special Requests   Final    BOTTLES DRAWN AEROBIC AND ANAEROBIC Blood Culture results may not be optimal due to an excessive volume of blood received in culture bottles   Culture   Final    NO GROWTH < 24 HOURS Performed at [facility], [address] [place], [place] [address]    Report Status PENDING  Incomplete    Coagulation Studies: No results for input(s): "LABPROT", "INR" in the last 72 hours.   Urinalysis: No results for input(s): "COLORURINE", "LABSPEC", "PHURINE", "GLUCOSEU", "HGBUR", "BILIRUBINUR", "KETONESUR", "PROTEINUR", "UROBILINOGEN", "NITRITE", "LEUKOCYTESUR" in the last 72 hours.  Invalid input(s): "APPERANCEUR"    Imaging: No results found.   Medications:    anticoagulant sodium citrate      acidophilus  1 capsule Oral Daily   amLODipine  10 mg Oral QHS   amoxicillin-clavulanate  1  tablet Oral Daily   vitamin C  500 mg Oral BID   aspirin EC  81 mg Oral Daily   atorvastatin  20 mg Oral QHS   Chlorhexidine Gluconate Cloth  6 each Topical Q0600   feeding supplement (NEPRO CARB STEADY)  237 mL Oral BID BM   gabapentin  100 mg Oral TID   heparin injection (subcutaneous)  5,000 Units Subcutaneous Q8H   HYDROcodone-acetaminophen  1 tablet Oral UD   influenza vac split quadrivalent PF  0.5 mL Intramuscular Tomorrow-1000   lipase/protease/amylase  72,000 Units Oral TID AC   mirtazapine  7.5 mg Oral QHS   multivitamin  1 tablet Oral QHS   pantoprazole  40 mg Oral Daily   sevelamer carbonate  800 mg Oral QID   zinc sulfate  220 mg Oral Daily   acetaminophen **OR** acetaminophen, albuterol, alteplase, anticoagulant sodium citrate, heparin, HYDROmorphone (DILAUDID) injection, hydrOXYzine, lidocaine (PF), lidocaine-prilocaine, lipase/protease/amylase, meclizine, ondansetron **OR** ondansetron (ZOFRAN) IV, pentafluoroprop-tetrafluoroeth, phenol, promethazine  Assessment/ Plan:  [name] is a 61 y.o.  female with past medical history of  anemia, asthma, diabetes, CAD, PAD, GERD, and end stage renal disease on dialysis.  Patient arrived to emergency department via EMS after being found at home by grandson with multiple space heaters deterrent to high setting and patient wrapped in heated blanket.  Patient has been admitted for Colitis [K52.9] Acute colitis [K52.9] Heat exposure, initial encounter [T67.9XXA] Fever, unspecified fever cause [R50.9] Sepsis, due to unspecified organism, unspecified whether acute organ dysfunction present ([place]) [A41.9]  [facility]/TTS/Rt Permcath  End-stage renal disease on hemodialysis.  Will maintain outpatient schedule if possible.   Patient was last dialyzed yesterday-Saturday.        No need for renal placement therapy today  2. Anemia of chronic kidney disease Lab Results  Component Value Date   HGB 7.6 (L) 07/05/2022    Patient receives [place] outpatient. Hgb is not  at goal  3. Secondary Hyperparathyroidism: with outpatient labs: PTH 485, phosphorus 3.9, calcium 8.5 on 06/03/22.   Lab Results  Component Value Date   PTH 357 (H) 10/05/2018   CALCIUM 7.7 (L) 07/05/2022   CAION 1.09 (L) 04/13/2020   PHOS 4.9 (H) 12/03/2021   Calcium slightly decreased and phosphorus at target.  Continue Sevelamer.   4. Diabetes mellitus type II with chronic kidney disease/renal manifestations:noninsulin dependent. Most recent hemoglobin A1c is 4.1 on 07/02/22.   Glucose well managed.  5.  Hypertension with chronic kidney disease.         Blood pressure Is stable.      LOS: 4 Heylee Tant s [facility] 10/15/20238:23 AM

## 2022-07-06 NOTE — Discharge Instructions (Signed)
Food Resources  Agency Name: [facility] Address: [address], [place], [place] [address] Phone: [phone] Website: [email or web address]  Service(s) Offered: Housing services, self-sufficiency, congregate meal  program, weatherization program, heating appliance  repair/replacement program, emergency food assistance,  housing counseling, home ownership program, wheels -towork program. Meals free for 60 and older at various  locations from 9am-1pm, Monday-Friday:  [facility], [address] [place], [phone] [place]   [facility], [address] [place] [phone]   [facility], [address]   [place] [phone] [facility], [address]   [place], [phone]  Agency Name: [facility] Address: [address], [place], [place] [address] Phone: [phone] Website: [email or web address] Service(s) Offered: Home delivered hot, frozen, and emergency  meals. Grocery assistance program which matches  volunteers one-on-one with seniors unable to grocery shop  for themselves. Must be 60 years and older; less than 20  hours of in-home aide service, limited or no driving ability;  live alone or with someone with a disability; live in  [place].  Agency Name: [facility] Address: [address] [place], [place] [address] Phone: [phone] Service(s) Offered: Food is served to shut-ins, homeless, elderly, and low  income people in the community every Saturday (11:30  am-12:30 pm) and Sunday (12:30 pm-1:30pm). Volunteers  also offer help and encouragement in seeking employment,  and spiritual guidance. January 15, 2017 8  Agency Name: [facility] Address: [address] [place], [place], [place] [address] Phone: [phone] Service(s) Offered: Child support services; child welfare services; food stamps;  Medicaid;  work first family assistance; and aid with fuel,  rent, food and medicine.  Agency Name: [facility] Address: [address] [place], [place] Phone: [phone] Website: www.dreamalign.com Services Offered: Monday 10:00am-12:00, 8:00pm-9:00pm, and Friday  10:00am-12:00. Agency Name: [facility] Address: [address], [place], [place] [address] Phone: [phone] Website: www.[email or web address] Service(s) Offered: Serves weekday meals, open from 11:30 am- 1:00 pm., and  6:30-7:30pm, Monday-Wednesday-Friday distributes food  3:30-6pm, Monday-Wednesday-Friday.  Agency Name: [facility] Address: [address], [place], [place] Phone: [phone] Website: [email or web address] Services Offered: Distributes food the 4th Saturday of the month, starting at  8:00 am Agency Name: [facility] Address: [address], [place], [place] [address] Phone: [phone] Website: [email or web address] Service(s) Offered: [facility], weekly food pantry. Open Wednesdays from  10:00am-noon.  Agency Name: [facility] Address: [address], [place], [place] Phone: [phone] Services Offered: Distributes food 9am-1pm, Monday-Thursday. Call for details.   Agency Name: [facility] Address: [address] [place], [place] [address] Phone: [phone] Website: [email or web address] Service(s) Offered: Emergency Food Pantry. Call for assistance. Agency Name: [facility] Address: [address], [place], [place] [address] Phone: [phone] Service Offered: Emergency Food Pantry. Call for appointment.  Agency Name: [facility] Address: [address] [place], [place] [address] Phone: [phone] Website: [email or web address] Services Offered: Emergency Food Pantry. Call for details Agency Name: [facility] Address: [address] [place], [place] Phone: [phone] Website: [email or web address] Service(s) Offered: Emergency Food Pantry. Call for details.  Agency Name: [facility] Address: [address], [place],  [address] Phone: [phone] or [phone] Website: [email or web address] Service(s) Offered: Distribute food 9am-11:30 am, Tuesday-Friday, and 1- 3:30pm, Monday-Friday. Food pantry Monday-Friday  1pm-3pm, fresh items, Mon.-Wed.-Fri.  Agency Name: [facility] Address: [address] [place], [place] [address] Phone: [phone] Website: [email or web address] Services Offered: Distribute food Tues and Sats from 9:00am-noon. Closed  1st Saturday of each month. Call for details    

## 2022-07-07 LAB — GLUCOSE, CAPILLARY
Glucose-Capillary: 83 mg/dL (ref 70–99)
Glucose-Capillary: 101 mg/dL — ABNORMAL HIGH (ref 70–99)
Glucose-Capillary: 97 mg/dL (ref 70–99)

## 2022-07-07 LAB — BASIC METABOLIC PANEL
Anion gap: 9 (ref 5–15)
BUN: 13 mg/dL (ref 8–23)
CO2: 29 mmol/L (ref 22–32)
Calcium: 8.4 mg/dL — ABNORMAL LOW (ref 8.9–10.3)
Chloride: 96 mmol/L — ABNORMAL LOW (ref 98–111)
Creatinine, Ser: 4.65 mg/dL — ABNORMAL HIGH (ref 0.44–1.00)
GFR, Estimated: 10 mL/min — ABNORMAL LOW (ref >60)
Glucose, Bld: 93 mg/dL (ref 70–99)
Potassium: 3.7 mmol/L (ref 3.5–5.1)
Sodium: 134 mmol/L — ABNORMAL LOW (ref 135–145)

## 2022-07-07 LAB — CBC WITH DIFFERENTIAL/PLATELET
Abs Immature Granulocytes: 0.02 10*3/uL (ref 0.00–0.07)
Basophils Absolute: 0.1 10*3/uL (ref 0.0–0.1)
Basophils Relative: 1 %
Eosinophils Absolute: 1 10*3/uL — ABNORMAL HIGH (ref 0.0–0.5)
Eosinophils Relative: 21 %
HCT: 27 % — ABNORMAL LOW (ref 36.0–46.0)
Hemoglobin: 8.5 g/dL — ABNORMAL LOW (ref 12.0–15.0)
Immature Granulocytes: 0 %
Lymphocytes Relative: 28 %
Lymphs Abs: 1.3 10*3/uL (ref 0.7–4.0)
MCH: 28.1 pg (ref 26.0–34.0)
MCHC: 31.5 g/dL (ref 30.0–36.0)
MCV: 89.1 fL (ref 80.0–100.0)
Monocytes Absolute: 0.4 10*3/uL (ref 0.1–1.0)
Monocytes Relative: 8 %
Neutro Abs: 2 10*3/uL (ref 1.7–7.7)
Neutrophils Relative %: 42 %
Platelets: 154 10*3/uL (ref 150–400)
RBC: 3.03 MIL/uL — ABNORMAL LOW (ref 3.87–5.11)
RDW: 14.6 % (ref 11.5–15.5)
Smear Review: NORMAL
WBC: 4.7 10*3/uL (ref 4.0–10.5)
nRBC: 0 % (ref 0.0–0.2)

## 2022-07-07 LAB — MAGNESIUM: Magnesium: 2 mg/dL (ref 1.7–2.4)

## 2022-07-07 MED ORDER — SEVELAMER CARBONATE 800 MG PO TABS
800.0000 mg | ORAL_TABLET | ORAL | Status: DC
  Administered 2022-07-07: 800 mg via ORAL
  Filled 2022-07-07: qty 1

## 2022-07-07 MED ORDER — SEVELAMER CARBONATE 800 MG PO TABS
800.0000 mg | ORAL_TABLET | Freq: Three times a day (TID) | ORAL | Status: DC
  Administered 2022-07-07 – 2022-07-08 (×3): 800 mg via ORAL
  Filled 2022-07-07 (×3): qty 1

## 2022-07-07 NOTE — Progress Notes (Signed)
[facility]  ROUNDING NOTE   Subjective:   [name] is a 61 y.o., female with past medical history of anemia, asthma, diabetes, CAD, PAD, GERD, and end stage renal disease on dialysis.  Patient arrived to emergency department via EMS after being found at home by grandson with multiple space heaters deterrent to high setting and patient wrapped in heated blanket.  Patient has been admitted for Colitis [K52.9] Acute colitis [K52.9] Heat exposure, initial encounter [T67.9XXA] Fever, unspecified fever cause [R50.9] Sepsis, due to unspecified organism, unspecified whether acute organ dysfunction present [facility]) [A41.9]  Patient is known to our practice and receives outpatient dialysis treatments at [facility] on a TTS schedule, supervised by Dr. Holley Raring.    Patient was seen today on first floor. Patient offers no new specific physical complaints.   Objective:  Vital signs in last 24 hours:  Temp:  [97.8 F (36.6 C)-98.6 F (37 C)] 97.9 F (36.6 C) (10/16 1703) Pulse Rate:  [59-72] 62 (10/16 1703) Resp:  [16-18] 18 (10/16 1703) BP: (135-144)/(53-59) 137/55 (10/16 1703) SpO2:  [97 %-100 %] 100 % (10/16 1703)  Weight change:  Filed Weights   07/03/22 0920 07/03/22 1303 07/05/22 0909  Weight: 80.1 kg 78.3 kg 81.1 kg    Intake/Output: No intake/output data recorded.   Intake/Output this shift:  No intake/output data recorded.  Physical Exam: General: NAD  Head: Normocephalic, atraumatic. Moist oral mucosal membranes  Eyes: Anicteric  Lungs:  Clear to auscultation. Normal effort, [place] O2  Heart: Regular rate and rhythm  Abdomen:  Soft, nontender  Extremities:  trace peripheral edema.Chronic venous stasis changes  Neurologic: Nonfocal, moving all four extremities  Skin: No lesions, dry  Access: Rt permcath    Basic Metabolic Panel: Recent Labs  Lab 07/01/22 2228 07/02/22 0605 07/05/22 0847 07/05/22 0848 07/07/22 1535  NA 137 137  --  133* 134*  K  4.7 3.5  --  3.1* 3.7  CL 96* 98  --  97* 96*  CO2 28 27  --  28 29  GLUCOSE 45* 72  --  86 93  BUN 14 17  --  17 13  CREATININE 4.33* 4.56*  --  4.68* 4.65*  CALCIUM 7.9* 7.6*  --  7.7* 8.4*  MG  --   --  1.9  --  2.0    Liver Function Tests: Recent Labs  Lab 07/01/22 2228  AST 33  ALT 7  ALKPHOS 70  BILITOT 1.0  PROT 6.5  ALBUMIN 3.0*   Recent Labs  Lab 07/01/22 2228  LIPASE 23   No results for input(s): "AMMONIA" in the last 168 hours.  CBC: Recent Labs  Lab 07/01/22 2228 07/02/22 0605 07/05/22 0848 07/07/22 1535  WBC 9.3 12.8* 4.0 4.7  NEUTROABS 6.5  --   --  2.0  HGB 9.2* 9.6* 7.6* 8.5*  HCT 29.0* 29.1* 24.1* 27.0*  MCV 90.1 86.4 88.9 89.1  PLT 95* 101* 99* 154    Cardiac Enzymes: Recent Labs  Lab 07/01/22 2228  CKTOTAL 63    BNP: Invalid input(s): "POCBNP"  CBG: Recent Labs  Lab 07/06/22 2100 07/06/22 2344 07/07/22 0637 07/07/22 1213 07/07/22 1704  GLUCAP 109* 115* 83 101* 31    Microbiology: Results for orders placed or performed during the hospital encounter of 07/01/22  Culture, blood (Routine x 2)     Status: None   Collection Time: 07/01/22 11:14 PM   Specimen: BLOOD  Result Value Ref Range Status   Specimen  Description BLOOD BLOOD RIGHT HAND  Final   Special Requests   Final    BOTTLES DRAWN AEROBIC ONLY Blood Culture adequate volume   Culture   Final    NO GROWTH 5 DAYS Performed at [facility], [address] [place], [place] [address]    Report Status 07/06/2022 FINAL  Final  SARS Coronavirus 2 by RT PCR (hospital order, performed in [facility] hospital lab) *cepheid single result test* Anterior Nasal Swab     Status: None   Collection Time: 07/02/22 12:56 PM   Specimen: Anterior Nasal Swab  Result Value Ref Range Status   SARS Coronavirus 2 by RT PCR NEGATIVE NEGATIVE Final    Comment: (NOTE) SARS-CoV-2 target nucleic acids are NOT DETECTED.  The SARS-CoV-2 RNA is generally detectable in upper and  lower respiratory specimens during the acute phase of infection. The lowest concentration of SARS-CoV-2 viral copies this assay can detect is 250 copies / mL. A negative result does not preclude SARS-CoV-2 infection and should not be used as the sole basis for treatment or other patient management decisions.  A negative result may occur with improper specimen collection / handling, submission of specimen other than nasopharyngeal swab, presence of viral mutation(s) within the areas targeted by this assay, and inadequate number of viral copies (<250 copies / mL). A negative result must be combined with clinical observations, patient history, and epidemiological information.  Fact Sheet for Patients:   https://www.patel.info/  Fact Sheet for Healthcare Providers: https://hall.com/  This test is not yet approved or  cleared by the [place] FDA and has been authorized for detection and/or diagnosis of SARS-CoV-2 by FDA under an Emergency Use Authorization (EUA).  This EUA will remain in effect (meaning this test can be used) for the duration of the COVID-19 declaration under Section 564(b)(1) of the Act, 21 U.S.C. section 360bbb-3(b)(1), unless the authorization is terminated or revoked sooner.  Performed at [facility], [address] [place], [place] [address]   Culture, blood (Routine X 2) w Reflex to ID Panel     Status: None (Preliminary result)   Collection Time: 07/05/22  8:45 AM   Specimen: BLOOD  Result Value Ref Range Status   Specimen Description BLOOD HEMODIALYSIS LINE  Final   Special Requests   Final    BOTTLES DRAWN AEROBIC AND ANAEROBIC Blood Culture results may not be optimal due to an excessive volume of blood received in culture bottles   Culture   Final    NO GROWTH 2 DAYS Performed at [facility], [address] [place], [place] [address]    Report Status PENDING  Incomplete     Coagulation Studies: No results for input(s): "LABPROT", "INR" in the last 72 hours.   Urinalysis: No results for input(s): "COLORURINE", "LABSPEC", "PHURINE", "GLUCOSEU", "HGBUR", "BILIRUBINUR", "KETONESUR", "PROTEINUR", "UROBILINOGEN", "NITRITE", "LEUKOCYTESUR" in the last 72 hours.  Invalid input(s): "APPERANCEUR"    Imaging: No results found.   Medications:    anticoagulant sodium citrate      acidophilus  1 capsule Oral Daily   amLODipine  10 mg Oral QHS   vitamin C  500 mg Oral BID   aspirin EC  81 mg Oral Daily   Chlorhexidine Gluconate Cloth  6 each Topical Q0600   feeding supplement (NEPRO CARB STEADY)  237 mL Oral BID BM   gabapentin  100 mg Oral TID   heparin injection (subcutaneous)  5,000 Units Subcutaneous Q8H   HYDROcodone-acetaminophen  1 tablet Oral  UD   influenza vac split quadrivalent PF  0.5 mL Intramuscular Tomorrow-1000   lipase/protease/amylase  72,000 Units Oral TID AC   mirtazapine  7.5 mg Oral QHS   multivitamin  1 tablet Oral Daily   pantoprazole  40 mg Oral BID AC   sevelamer carbonate  800 mg Oral TID WC   sevelamer carbonate  800 mg Oral Daily   zinc sulfate  220 mg Oral Daily   acetaminophen **OR** acetaminophen, albuterol, alteplase, anticoagulant sodium citrate, heparin, HYDROmorphone (DILAUDID) injection, hydrOXYzine, lidocaine (PF), lidocaine-prilocaine, lipase/protease/amylase, meclizine, ondansetron **OR** ondansetron (ZOFRAN) IV, pentafluoroprop-tetrafluoroeth, phenol, promethazine  Assessment/ Plan:  [name] is a 61 y.o.  female with past medical history of anemia, asthma, diabetes, CAD, PAD, GERD, and end stage renal disease on dialysis.  Patient arrived to emergency department via EMS after being found at home by grandson with multiple space heaters deterrent to high setting and patient wrapped in heated blanket.  Patient has been admitted for Colitis [K52.9] Acute colitis [K52.9] Heat exposure, initial encounter  [T67.9XXA] Fever, unspecified fever cause [R50.9] Sepsis, due to unspecified organism, unspecified whether acute organ dysfunction present ([place]) [A41.9]  [facility]/TTS/Rt Permcath  End-stage renal disease on hemodialysis.  Will maintain outpatient schedule if possible.         Patient was last dialyzed on Saturday.        No need for renal placement therapy today.       We will dialyze patient tomorrow      2. Anemia of chronic kidney disease Lab Results  Component Value Date   HGB 8.5 (L) 07/07/2022    Patient receives [place] outpatient. Hgb is not  at goal  3. Secondary Hyperparathyroidism: with outpatient labs: PTH 485, phosphorus 3.9, calcium 8.5 on 06/03/22.   Lab Results  Component Value Date   PTH 357 (H) 10/05/2018   CALCIUM 8.4 (L) 07/07/2022   CAION 1.09 (L) 04/13/2020   PHOS 4.9 (H) 12/03/2021   Calcium slightly decreased and phosphorus at target.  Continue Sevelamer.   4. Diabetes mellitus type II with chronic kidney disease/renal manifestations:noninsulin dependent.   Glucose well managed.  5.  Hypertension with chronic kidney disease.         Blood pressure Is stable.      LOS: 5 Airel Magadan s [facility] 10/16/20237:08 PM

## 2022-07-07 NOTE — Progress Notes (Addendum)
Progress Note    [name]  JSH:[id number] DOB: December 14, 1960  DOA: 07/01/2022 PCP: Denton Lank, MD      Brief Narrative:    Medical records reviewed and are as summarized below:   [name] is a 61 y.o. female with medical history significant for end-stage renal disease on hemodialysis chronic diastolic heart failure, COPD, diabetes mellitus, GERD presented to the hospital provides.  Patient was initially cooled due to initial suspicion for hyperthermia since she had turned her furnace up at home..  Oral temp with EMS was 103.  Patient in the ED endorsed abdominal pain but denied nausea or vomiting or diarrhea.  She does not produce urine.  Is mildly somnolent.  Initial temperature in the ED was 102.3 F.  WBC was 9.3 with procalcitonin of 0.6 and lactate of 1.2.   An arterial blood gas revealed normal pH with PCO2 of 53.  Blood glucose was 45 on arrival but did improve on its own to 82.  CT head was negative for acute findings.  CT scan of the abdomen showed Mild colonic wall thickening involving the descending, sigmoid,and to a lesser extent ascending colon with associated pericolonic edema, suspicious for colitis.  Patient was then admitted hospital for further evaluation and treatment.        Assessment/Plan:   Principal Problem:   Acute colitis Active Problems:   Hypoglycemia in type 2 diabetes mellitus , without long-term current use of insulin (HCC)   Anemia in ESRD (end-stage renal disease) (HCC)   End-stage renal disease on hemodialysis (HCC)   HTN (hypertension)   Chronic diastolic CHF (congestive heart failure) ([place])   Debility   Nutrition Problem: Increased nutrient needs Etiology: wound healing  Signs/Symptoms: estimated needs   Body mass index is 29.3 kg/m.   Acute colitis, diarrhea, chronic dark stools Plan to complete Augmentin today.  She said she is scheduled for colonoscopy in December 2023.   Hypoglycemia in type 2 diabetes mellitus , without  long-term current use of insulin (HCC) Blood glucose level was 45 on presentation.  Lowest blood glucose level was 23.  Glucose levels appears to have stabilized.  She used to be on insulin in the past but her PCP had taken her off of insulin because of hypoglycemic episodes.   Anemia in ESRD (end-stage renal disease) (HCC) Hemoglobin up from 7.6-8.5 today..  Repeat CBC tomorrow with dialysis.   End-stage renal disease on full-time today hemodialysis [facility]) On Tuesday Thursday Saturday schedule.  Plan for hemodialysis tomorrow.  Follow-up with nephrologist  Hypokalemia Improved repeat BMP tomorrow with dialysis  HTN (hypertension) Continue amlodipine   COPD/chronic diastolic CHF with chronic hypoxic respiratory failure. Compensated.  She uses 2 L/min oxygen at baseline.   Pressure injury.  Present on admission.  Continue wound care.        Pressure Injury 07/02/22 Buttocks Bilateral Deep Tissue Pressure Injury - Purple or maroon localized area of discolored intact skin or blood-filled blister due to damage of underlying soft tissue from pressure and/or shear. Foam placed (Active)  07/02/22 2039  Location: Buttocks  Location Orientation: Bilateral  Staging: Deep Tissue Pressure Injury - Purple or maroon localized area of discolored intact skin or blood-filled blister due to damage of underlying soft tissue from pressure and/or shear.  Wound Description (Comments): Foam placed  Present on Admission: Yes        Diet Order             Diet Carb  Modified Fluid consistency: Thin; Room service appropriate? Yes  Diet effective midnight                            Consultants: Nephrologist  Procedures: None    Medications:    acidophilus  1 capsule Oral Daily   amLODipine  10 mg Oral QHS   vitamin C  500 mg Oral BID   aspirin EC  81 mg Oral Daily   Chlorhexidine Gluconate Cloth  6 each Topical Q0600   feeding supplement (NEPRO CARB STEADY)  237 mL Oral BID  BM   gabapentin  100 mg Oral TID   heparin injection (subcutaneous)  5,000 Units Subcutaneous Q8H   HYDROcodone-acetaminophen  1 tablet Oral UD   influenza vac split quadrivalent PF  0.5 mL Intramuscular Tomorrow-1000   lipase/protease/amylase  72,000 Units Oral TID AC   mirtazapine  7.5 mg Oral QHS   multivitamin  1 tablet Oral Daily   pantoprazole  40 mg Oral BID AC   sevelamer carbonate  800 mg Oral TID WC   sevelamer carbonate  800 mg Oral Daily   zinc sulfate  220 mg Oral Daily   Continuous Infusions:  anticoagulant sodium citrate       Anti-infectives (From admission, onward)    Start     Dose/Rate Route Frequency Ordered Stop   07/06/22 1000  amoxicillin-clavulanate (AUGMENTIN) 500-125 MG per tablet 1 tablet        1 tablet Oral Daily 07/06/22 0755 07/07/22 0855   07/03/22 0100  cefTRIAXone (ROCEPHIN) 2 g in sodium chloride 0.9 % 100 mL IVPB  Status:  Discontinued        2 g 200 mL/hr over 30 Minutes Intravenous Every 24 hours 07/02/22 0038 07/06/22 0755   07/02/22 1300  metroNIDAZOLE (FLAGYL) IVPB 500 mg  Status:  Discontinued        500 mg 100 mL/hr over 60 Minutes Intravenous Every 12 hours 07/02/22 0038 07/06/22 0755   07/02/22 0000  cefTRIAXone (ROCEPHIN) 2 g in sodium chloride 0.9 % 100 mL IVPB        2 g 200 mL/hr over 30 Minutes Intravenous  Once 07/01/22 2351 07/02/22 0056   07/02/22 0000  metroNIDAZOLE (FLAGYL) IVPB 500 mg        500 mg 100 mL/hr over 60 Minutes Intravenous  Once 07/01/22 2351 07/02/22 0202              Family Communication/Anticipated D/C date and plan/Code Status   DVT prophylaxis: heparin injection 5,000 Units Start: 07/02/22 0600     Code Status: Full Code  Family Communication: Plan discussed with  [name], son, over the phone Disposition Plan: Plan to discharge to SNF in 2 to 3 days   Status is: Inpatient Remains inpatient appropriate because: Awaiting placement to SNF       Subjective:   She reports dark stools  but she said she has still had dark stools for a long time.  She had nausea today.  She said she is scheduled for colonoscopy in December this year.  No shortness of breath, chest pain, dizziness  Objective:    Vitals:   07/06/22 2027 07/07/22 0632 07/07/22 0802 07/07/22 1703  BP: (!) 139/54 (!) 135/59 (!) 144/53 (!) 137/55  Pulse: 72 (!) 59 61 62  Resp: '18 16 18 18  '$ Temp: 98.6 F (37 C) 98.3 F (36.8 C) 97.8 F (36.6 C) 97.9 F (36.6 C)  TempSrc: Oral Oral    SpO2: 97% 100% 100% 100%  Weight:      Height:       No data found.  No intake or output data in the 24 hours ending 07/07/22 1726  Filed Weights   07/03/22 0920 07/03/22 1303 07/05/22 0909  Weight: 80.1 kg 78.3 kg 81.1 kg    Exam:  GEN: NAD SKIN: Warm and dry.  Chronic skin changes on bilateral legs with nodularity on bilateral legs. EYES: No pallor or icterus ENT: MMM CV: RRR PULM: CTA B ABD: soft, ND, NT, +BS CNS: AAO x 3, non focal EXT: Bilateral lower extremity edema.    Data Reviewed:   I have personally reviewed following labs and imaging studies:  Labs: Labs show the following:   Basic Metabolic Panel: Recent Labs  Lab 07/01/22 2228 07/02/22 0605 07/05/22 0847 07/05/22 0848 07/07/22 1535  NA 137 137  --  133* 134*  K 4.7 3.5  --  3.1* 3.7  CL 96* 98  --  97* 96*  CO2 28 27  --  28 29  GLUCOSE 45* 72  --  86 93  BUN 14 17  --  17 13  CREATININE 4.33* 4.56*  --  4.68* 4.65*  CALCIUM 7.9* 7.6*  --  7.7* 8.4*  MG  --   --  1.9  --  2.0   GFR Estimated Creatinine Clearance: 13.2 mL/min (A) (by C-G formula based on SCr of 4.65 mg/dL (H)). Liver Function Tests: Recent Labs  Lab 07/01/22 2228  AST 33  ALT 7  ALKPHOS 70  BILITOT 1.0  PROT 6.5  ALBUMIN 3.0*   Recent Labs  Lab 07/01/22 2228  LIPASE 23   No results for input(s): "AMMONIA" in the last 168 hours. Coagulation profile Recent Labs  Lab 07/02/22 0752  INR 1.5*    CBC: Recent Labs  Lab 07/01/22 2228  07/02/22 0605 07/05/22 0848 07/07/22 1535  WBC 9.3 12.8* 4.0 4.7  NEUTROABS 6.5  --   --  2.0  HGB 9.2* 9.6* 7.6* 8.5*  HCT 29.0* 29.1* 24.1* 27.0*  MCV 90.1 86.4 88.9 89.1  PLT 95* 101* 99* 154   Cardiac Enzymes: Recent Labs  Lab 07/01/22 2228  CKTOTAL 63   BNP (last 3 results) No results for input(s): "PROBNP" in the last 8760 hours. CBG: Recent Labs  Lab 07/06/22 2100 07/06/22 2344 07/07/22 0637 07/07/22 1213 07/07/22 1704  GLUCAP 109* 115* 83 101* 97   D-Dimer: No results for input(s): "DDIMER" in the last 72 hours. Hgb A1c: No results for input(s): "HGBA1C" in the last 72 hours. Lipid Profile: No results for input(s): "CHOL", "HDL", "LDLCALC", "TRIG", "CHOLHDL", "LDLDIRECT" in the last 72 hours. Thyroid function studies: No results for input(s): "TSH", "T4TOTAL", "T3FREE", "THYROIDAB" in the last 72 hours.  Invalid input(s): "FREET3" Anemia work up: Recent Labs    07/05/22 0848  IRON 60   Sepsis Labs: Recent Labs  Lab 07/01/22 2228 07/01/22 2314 07/02/22 0605 07/02/22 0752 07/05/22 0848 07/07/22 1535  PROCALCITON 0.63  --   --   --   --   --   WBC 9.3  --  12.8*  --  4.0 4.7  LATICACIDVEN  --  1.2  --  1.5  --   --     Microbiology Recent Results (from the past 240 hour(s))  Culture, blood (Routine x 2)     Status: None   Collection Time: 07/01/22 11:14 PM   Specimen: BLOOD  Result Value Ref Range Status   Specimen Description BLOOD BLOOD RIGHT HAND  Final   Special Requests   Final    BOTTLES DRAWN AEROBIC ONLY Blood Culture adequate volume   Culture   Final    NO GROWTH 5 DAYS Performed at [facility], [address] [place], [place] [address]    Report Status 07/06/2022 FINAL  Final  SARS Coronavirus 2 by RT PCR (hospital order, performed in [facility] hospital lab) *cepheid single result test* Anterior Nasal Swab     Status: None   Collection Time: 07/02/22 12:56 PM   Specimen: Anterior Nasal Swab  Result Value Ref  Range Status   SARS Coronavirus 2 by RT PCR NEGATIVE NEGATIVE Final    Comment: (NOTE) SARS-CoV-2 target nucleic acids are NOT DETECTED.  The SARS-CoV-2 RNA is generally detectable in upper and lower respiratory specimens during the acute phase of infection. The lowest concentration of SARS-CoV-2 viral copies this assay can detect is 250 copies / mL. A negative result does not preclude SARS-CoV-2 infection and should not be used as the sole basis for treatment or other patient management decisions.  A negative result may occur with improper specimen collection / handling, submission of specimen other than nasopharyngeal swab, presence of viral mutation(s) within the areas targeted by this assay, and inadequate number of viral copies (<250 copies / mL). A negative result must be combined with clinical observations, patient history, and epidemiological information.  Fact Sheet for Patients:   https://www.patel.info/  Fact Sheet for Healthcare Providers: https://hall.com/  This test is not yet approved or  cleared by the [place] FDA and has been authorized for detection and/or diagnosis of SARS-CoV-2 by FDA under an Emergency Use Authorization (EUA).  This EUA will remain in effect (meaning this test can be used) for the duration of the COVID-19 declaration under Section 564(b)(1) of the Act, 21 U.S.C. section 360bbb-3(b)(1), unless the authorization is terminated or revoked sooner.  Performed at [facility], [address] [place], [place] [address]   Culture, blood (Routine X 2) w Reflex to ID Panel     Status: None (Preliminary result)   Collection Time: 07/05/22  8:45 AM   Specimen: BLOOD  Result Value Ref Range Status   Specimen Description BLOOD HEMODIALYSIS LINE  Final   Special Requests   Final    BOTTLES DRAWN AEROBIC AND ANAEROBIC Blood Culture results may not be optimal due to an excessive volume of blood  received in culture bottles   Culture   Final    NO GROWTH 2 DAYS Performed at [facility], [address], [place], [place] [address]    Report Status PENDING  Incomplete    Procedures and diagnostic studies:  No results found.             LOS: 5 days   Maliq Pilley  [facility]   Pager on [email or web address]. If 7PM-7AM, please contact night-coverage at [email or web address]     07/07/2022, 5:26 PM

## 2022-07-07 NOTE — Plan of Care (Signed)

## 2022-07-07 NOTE — Progress Notes (Signed)
[name] MRN: [id number] DOB: August 08, 1961   Cancelled Treatment:    Reason Eval/Treat Not Completed: Fatigue/lethargy limiting ability to participate. Chart reviewed prior to entry. Upon entry to room pt very sleepy and lethargic. Unable to keep eyes open. Reports getting medication that makes he tired declining PT attempts. Attempts made to remove covers and assist pt in sitting to see if pt would become more alert but pt becoming agitated raising voice at [employer]. Further attempts deferred. Educated pt on importance of functional mobility to return to PLOF with pt continuing to decline efforts. Will re-attempt as able.    Salem Caster. Fairly IV, PT, DPT Physical Therapist- [facility]  07/07/2022, 10:14 AM

## 2022-07-07 NOTE — Progress Notes (Signed)
Spoke with patient's son, Parnika Tweten regardign discharge plan. He and patient are agreeable to bed offer at [facility]. Will begin auth. Patient son is requesting to speak with MD. Attending MD notified. Auth started.

## 2022-07-07 NOTE — Progress Notes (Signed)
Physical Therapy Treatment Patient Details Name: [name] MRN: [id number] DOB: 24-Oct-1960 Today's Date: 07/07/2022   History of Present Illness [name] is a 61 y.o. female with medical history significant for end-stage renal disease on hemodialysis chronic diastolic heart failure, COPD, diabetes mellitus, GERD presented to the hospital provides.  Patient was initially cooled due to initial suspicion for hyperthermia since she had turned her furnace up at home..  Oral temp with EMS was 103.  Patient in the ED endorsed abdominal pain but denied nausea or vomiting or diarrhea.    PT Comments    Pt received supine in bed agreeable to PT services. Pt  slow moving but mostly performs all levels of mobility this date at supervision. Pt requires increased time, use of bed features to sit EoB, minGuard to stand at [facility] with bed elevated, then ambulating 20' with RW. Notable slowed gait cadence, multiple standing rest breaks to "stretch" due to  body stiffness from laying in the bed. Overall good stability with RW and safe use but just very slow cadence.  Pt returned to seated EOB with all needs in reach. Progressing in mobility but would still benefit from STR placement to optimize function, gait distance, and strength to reduce risk of falls as pt lives alone and does not have significant family support.    Recommendations for follow up therapy are one component of a multi-disciplinary discharge planning process, led by the attending physician.  Recommendations may be updated based on patient status, additional functional criteria and insurance authorization.  Follow Up Recommendations  Skilled nursing-short term rehab (<3 hours/day) Can patient physically be transported by private vehicle: Yes   Assistance Recommended at Discharge Frequent or constant Supervision/Assistance  Patient can return home with the following A little help with walking and/or transfers;A little help with  bathing/dressing/bathroom;Assistance with cooking/housework;Assist for transportation;Help with stairs or ramp for entrance   Equipment Recommendations  Other (comment) (defer to next venue of care)    Recommendations for Other Services       Precautions / Restrictions Precautions Precautions: Fall Restrictions Weight Bearing Restrictions: No     Mobility  Bed Mobility Overal bed mobility: Needs Assistance Bed Mobility: Supine to Sit     Supine to sit: Supervision, HOB elevated     General bed mobility comments: Increased time, bed features Patient Response: Cooperative  Transfers Overall transfer level: Needs assistance Equipment used: Rolling walker (2 wheels) Transfers: Sit to/from Stand Sit to Stand: Min guard, From elevated surface           General transfer comment: increased time but performs without external support    Ambulation/Gait Ambulation/Gait assistance: Supervision Gait Distance (Feet): 20 Feet Assistive device: Rolling walker (2 wheels) Gait Pattern/deviations: Step-through pattern, Decreased step length - right, Decreased step length - left, Trunk flexed, Narrow base of support       General Gait Details: remains significantly slowed gait cadence but maintains stability with RW without evidence of imbalance.   Stairs             Wheelchair Mobility    Modified Rankin (Stroke Patients Only)       Balance Overall balance assessment: Needs assistance Sitting-balance support: Bilateral upper extremity supported, Feet supported Sitting balance-Leahy Scale: Fair       Standing balance-Leahy Scale: Fair Standing balance comment: can stand at [place] with light UE support  Cognition Arousal/Alertness: Awake/alert Behavior During Therapy: WFL for tasks assessed/performed Overall Cognitive Status: Within Functional Limits for tasks assessed                                 General  Comments: [facility]        Exercises General Exercises - Lower Extremity Ankle Circles/Pumps: AROM, Strengthening, 10 reps, Seated Long Arc Quad: AROM, Strengthening, Seated, Both, 5 reps Hip Flexion/Marching: AROM, Strengthening, Both, 5 reps, Standing Other Exercises Other Exercises: standing hamstring curls due to knee stiffness: x5/LE    General Comments General comments (skin integrity, edema, etc.): Pt removed [place] again to ambulate declining to upper 70's on RA. Required baseline 2 L/min elevating quickly back to > 90%.      Pertinent Vitals/Pain Pain Assessment Pain Assessment: No/denies pain    Home Living                          Prior Function            PT Goals (current goals can now be found in the care plan section) Acute Rehab PT Goals Patient Stated Goal: return home PT Goal Formulation: With patient Time For Goal Achievement: 07/18/22 Potential to Achieve Goals: Fair Progress towards PT goals: Progressing toward goals    Frequency    Min 2X/week      PT Plan Current plan remains appropriate    Co-evaluation              AM-PAC PT "6 Clicks" Mobility   Outcome Measure  Help needed turning from your back to your side while in a flat bed without using bedrails?: A Little Help needed moving from lying on your back to sitting on the side of a flat bed without using bedrails?: A Little Help needed moving to and from a bed to a chair (including a wheelchair)?: A Lot Help needed standing up from a chair using your arms (e.g., wheelchair or bedside chair)?: A Little Help needed to walk in hospital room?: A Lot Help needed climbing 3-5 steps with a railing? : A Lot 6 Click Score: 15    End of Session Equipment Utilized During Treatment: Gait belt;Oxygen Activity Tolerance: Patient tolerated treatment well Patient left: with bed alarm set (seated EOB) Nurse Communication: Mobility status PT Visit Diagnosis: Other abnormalities of gait and  mobility (R26.89);Muscle weakness (generalized) (M62.81)     Time: 4332-9518 PT Time Calculation (min) (ACUTE ONLY): 34 min  Charges:  $Therapeutic Exercise: 23-37 mins                     Miamor Ayler M. Fairly IV, PT, DPT Physical Therapist- [facility]  07/07/2022, 3:01 PM

## 2022-07-07 NOTE — Plan of Care (Signed)

## 2022-07-08 ENCOUNTER — Encounter: Payer: Self-pay | Admitting: Nephrology

## 2022-07-08 LAB — CBC WITH DIFFERENTIAL/PLATELET
Abs Immature Granulocytes: 0.01 K/uL (ref 0.00–0.07)
Basophils Absolute: 0 K/uL (ref 0.0–0.1)
Basophils Relative: 1 %
Eosinophils Absolute: 0.9 K/uL — ABNORMAL HIGH (ref 0.0–0.5)
Eosinophils Relative: 19 %
HCT: 24.7 % — ABNORMAL LOW (ref 36.0–46.0)
Hemoglobin: 7.8 g/dL — ABNORMAL LOW (ref 12.0–15.0)
Immature Granulocytes: 0 %
Lymphocytes Relative: 29 %
Lymphs Abs: 1.5 K/uL (ref 0.7–4.0)
MCH: 28.2 pg (ref 26.0–34.0)
MCHC: 31.6 g/dL (ref 30.0–36.0)
MCV: 89.2 fL (ref 80.0–100.0)
Monocytes Absolute: 0.5 K/uL (ref 0.1–1.0)
Monocytes Relative: 9 %
Neutro Abs: 2.1 K/uL (ref 1.7–7.7)
Neutrophils Relative %: 42 %
Platelets: 164 K/uL (ref 150–400)
RBC: 2.77 MIL/uL — ABNORMAL LOW (ref 3.87–5.11)
RDW: 14.8 % (ref 11.5–15.5)
Smear Review: NORMAL
WBC Morphology: ABNORMAL
WBC: 5 K/uL (ref 4.0–10.5)
nRBC: 0 % (ref 0.0–0.2)

## 2022-07-08 LAB — PATHOLOGIST SMEAR REVIEW

## 2022-07-08 LAB — BASIC METABOLIC PANEL
Anion gap: 9 (ref 5–15)
BUN: 16 mg/dL (ref 8–23)
CO2: 29 mmol/L (ref 22–32)
Calcium: 8.6 mg/dL — ABNORMAL LOW (ref 8.9–10.3)
Chloride: 96 mmol/L — ABNORMAL LOW (ref 98–111)
Creatinine, Ser: 5.36 mg/dL — ABNORMAL HIGH (ref 0.44–1.00)
GFR, Estimated: 8 mL/min — ABNORMAL LOW (ref >60)
Glucose, Bld: 92 mg/dL (ref 70–99)
Potassium: 3.6 mmol/L (ref 3.5–5.1)
Sodium: 134 mmol/L — ABNORMAL LOW (ref 135–145)

## 2022-07-08 LAB — PHOSPHORUS: Phosphorus: 2.3 mg/dL — ABNORMAL LOW (ref 2.5–4.6)

## 2022-07-08 MED ORDER — PROSOURCE PLUS PO LIQD: 30.0000 mL | Freq: Three times a day (TID) | ORAL | Status: DC

## 2022-07-08 MED ORDER — HEPARIN SODIUM (PORCINE) 1000 UNIT/ML IJ SOLN
INTRAMUSCULAR | Status: AC
  Filled 2022-07-08: qty 10

## 2022-07-08 MED ORDER — BOOST / RESOURCE BREEZE PO LIQD CUSTOM: 1.0000 | Freq: Three times a day (TID) | ORAL | Status: DC

## 2022-07-08 NOTE — Plan of Care (Signed)

## 2022-07-08 NOTE — Progress Notes (Signed)
[name] MRN: [id number] DOB: 12/16/1960   Cancelled Treatment:    Reason Eval/Treat Not Completed: Patient at procedure or test/unavailable. Chart reviewed. Pt off unit for HD. Will re-attempt when medically appropriate and as available.    Salem Caster. Fairly IV, PT, DPT Physical Therapist- [facility]  07/08/2022, 11:01 AM

## 2022-07-08 NOTE — Progress Notes (Signed)
Nutrition Follow-up  DOCUMENTATION CODES:   Not applicable  INTERVENTION:   -D/c Nepro -Boost Breeze po TID, each supplement provides 250 kcal and 9 grams of protein  -30 ml Prosource Plus TID, each supplement provides 100 kcals and 15 grams protein -Liberalize diet further to regular -Continue 500 mg vitamin C BID -Continue 220 mg zinc sulfate daily x 14 days -Check labs for potential micronutrient deficiencies (hair loss and wounds): essential fatty acid, riboflavin, biotin, iron, zinc, vitamin C, vitamin A  NUTRITION DIAGNOSIS:   Increased nutrient needs related to wound healing as evidenced by estimated needs.  Ongoing  GOAL:   Patient will meet greater than or equal to 90% of their needs  Progressing   MONITOR:   PO intake, Supplement acceptance, Diet advancement  REASON FOR ASSESSMENT:   Consult Assessment of nutrition requirement/status  ASSESSMENT:   Pt admitted after she was found at home by the grandson with the furnace turned up to the highest, the oven turned up to the highest with the oven door open, multiple space heaters on , and patient wrapped in heating blanket on  Reviewed I/O's: +2.4 L x 24 hours   Pt unavailable at time of visit; down in HD suite.   Pt currently on a carb modified diet. Noted poor oral intake; meal completions 0%. Pt is also refusing Nepro supplements. Per previous RD notes, pt does not tolerate milky supplements well and prefers Boost Breeze. Pt is taking her vitamins.   Per TOC notes, plan to d/c to SNF ([facility]) once medically stable.   Medications reviewed and include remeron and renvela.   Labs reviewed: Iron WLD. EFA, riboflavin, biotin, zinc, vitamin C, and vitamin A levels pending. CBGS: 83-101 (inpatient orders for glycemic control are none).    Diet Order:   Diet Order             Diet Carb Modified Fluid consistency: Thin; Room service appropriate? Yes  Diet effective midnight                    EDUCATION NEEDS:   Education needs have been addressed  Skin:  Skin Assessment: Skin Integrity Issues: Skin Integrity Issues:: DTI, Other (Comment) DTI: bilateral buttocks Other: wound to lt great toe  Last BM:  Unknown  Height:   Ht Readings from Last 1 Encounters:  07/02/22 5' 5.5" (1.664 m)    Weight:   Wt Readings from Last 1 Encounters:  07/08/22 79.6 kg    Ideal Body Weight:  58 kg  BMI:  Body mass index is 28.76 kg/m.  Estimated Nutritional Needs:   Kcal:  3893-7342  Protein:  90-105 grams  Fluid:  1000 ml + UOP    Loistine Chance, RD, LDN, [place] Registered Dietitian II Certified Diabetes Care and Education Specialist Please refer to [facility] for RD and/or RD on-call/weekend/after hours pager

## 2022-07-08 NOTE — Progress Notes (Addendum)
Per [facility] portal patient has been approved for [facility] 10/17-10/19 #[id number]  1:13pm Attempt to contact the facility to confirm discharge for today. No answer. Left a message for [employer] and [employer].   1:30  Facility confirmed patient would be accepted today. Patient will discharge to room 711. Nurse will call report to [phone]. Patient advised of discharge today. Patient son stated he would not be able to transport patient and she does not have anyone else to transport her. Son requesting EMS transport.   2:51pm EMS sheets printed to floor. EMS arranged. Discharge summary sent in [place]. TOC signing off.

## 2022-07-08 NOTE — Progress Notes (Signed)
[facility]  ROUNDING NOTE   Subjective:   [name] is a 61 y.o., female with past medical history of anemia, asthma, diabetes, CAD, PAD, GERD, and end stage renal disease on dialysis.  Patient arrived to emergency department via EMS after being found at home by grandson with multiple space heaters deterrent to high setting and patient wrapped in heated blanket.  Patient has been admitted for Colitis [K52.9] Acute colitis [K52.9] Heat exposure, initial encounter [T67.9XXA] Fever, unspecified fever cause [R50.9] Sepsis, due to unspecified organism, unspecified whether acute organ dysfunction present [facility]) [A41.9]  Patient is known to our practice and receives outpatient dialysis treatments at [facility] on a TTS schedule, supervised by Dr. Holley Raring.    Patient was seen on dialysis Patient tolerating treatment well   Objective:  Vital signs in last 24 hours:  Temp:  [97.8 F (36.6 C)-98.2 F (36.8 C)] 98.2 F (36.8 C) (10/17 0255) Pulse Rate:  [61-66] 65 (10/17 0255) Resp:  [18] 18 (10/17 0255) BP: (137-144)/(53-60) 142/60 (10/17 0255) SpO2:  [100 %] 100 % (10/17 0255)  Weight change:  Filed Weights   07/03/22 0920 07/03/22 1303 07/05/22 0909  Weight: 80.1 kg 78.3 kg 81.1 kg    Intake/Output: No intake/output data recorded.   Intake/Output this shift:  No intake/output data recorded.  Physical Exam: General: NAD  Head: Normocephalic, atraumatic. Moist oral mucosal membranes  Eyes: Anicteric  Lungs:  Clear to auscultation. Normal effort,  O2  Heart: Regular rate and rhythm  Abdomen:  Soft, nontender  Extremities:  trace peripheral edema.Chronic venous stasis changes  Neurologic: Nonfocal, moving all four extremities  Skin: No lesions, dry  Access: Rt permcath    Basic Metabolic Panel: Recent Labs  Lab 07/01/22 2228 07/02/22 0605 07/05/22 0847 07/05/22 0848 07/07/22 1535  NA 137 137  --  133* 134*  K 4.7 3.5  --  3.1* 3.7  CL 96* 98   --  97* 96*  CO2 28 27  --  28 29  GLUCOSE 45* 72  --  86 93  BUN 14 17  --  17 13  CREATININE 4.33* 4.56*  --  4.68* 4.65*  CALCIUM 7.9* 7.6*  --  7.7* 8.4*  MG  --   --  1.9  --  2.0    Liver Function Tests: Recent Labs  Lab 07/01/22 2228  AST 33  ALT 7  ALKPHOS 70  BILITOT 1.0  PROT 6.5  ALBUMIN 3.0*   Recent Labs  Lab 07/01/22 2228  LIPASE 23   No results for input(s): "AMMONIA" in the last 168 hours.  CBC: Recent Labs  Lab 07/01/22 2228 07/02/22 0605 07/05/22 0848 07/07/22 1535  WBC 9.3 12.8* 4.0 4.7  NEUTROABS 6.5  --   --  2.0  HGB 9.2* 9.6* 7.6* 8.5*  HCT 29.0* 29.1* 24.1* 27.0*  MCV 90.1 86.4 88.9 89.1  PLT 95* 101* 99* 154    Cardiac Enzymes: Recent Labs  Lab 07/01/22 2228  CKTOTAL 63    BNP: Invalid input(s): "POCBNP"  CBG: Recent Labs  Lab 07/06/22 2100 07/06/22 2344 07/07/22 0637 07/07/22 1213 07/07/22 1704  GLUCAP 109* 115* 83 101* 21    Microbiology: Results for orders placed or performed during the hospital encounter of 07/01/22  Culture, blood (Routine x 2)     Status: None   Collection Time: 07/01/22 11:14 PM   Specimen: BLOOD  Result Value Ref Range Status   Specimen Description BLOOD BLOOD RIGHT HAND  Final   Special Requests   Final    BOTTLES DRAWN AEROBIC ONLY Blood Culture adequate volume   Culture   Final    NO GROWTH 5 DAYS Performed at [facility], [address] [place], [place] [address]    Report Status 07/06/2022 FINAL  Final  SARS Coronavirus 2 by RT PCR (hospital order, performed in [facility] hospital lab) *cepheid single result test* Anterior Nasal Swab     Status: None   Collection Time: 07/02/22 12:56 PM   Specimen: Anterior Nasal Swab  Result Value Ref Range Status   SARS Coronavirus 2 by RT PCR NEGATIVE NEGATIVE Final    Comment: (NOTE) SARS-CoV-2 target nucleic acids are NOT DETECTED.  The SARS-CoV-2 RNA is generally detectable in upper and lower respiratory specimens during the  acute phase of infection. The lowest concentration of SARS-CoV-2 viral copies this assay can detect is 250 copies / mL. A negative result does not preclude SARS-CoV-2 infection and should not be used as the sole basis for treatment or other patient management decisions.  A negative result may occur with improper specimen collection / handling, submission of specimen other than nasopharyngeal swab, presence of viral mutation(s) within the areas targeted by this assay, and inadequate number of viral copies (<250 copies / mL). A negative result must be combined with clinical observations, patient history, and epidemiological information.  Fact Sheet for Patients:   https://www.patel.info/  Fact Sheet for Healthcare Providers: https://hall.com/  This test is not yet approved or  cleared by the [place] FDA and has been authorized for detection and/or diagnosis of SARS-CoV-2 by FDA under an Emergency Use Authorization (EUA).  This EUA will remain in effect (meaning this test can be used) for the duration of the COVID-19 declaration under Section 564(b)(1) of the Act, 21 U.S.C. section 360bbb-3(b)(1), unless the authorization is terminated or revoked sooner.  Performed at [facility], [address] [place], [place] [address]   Culture, blood (Routine X 2) w Reflex to ID Panel     Status: None (Preliminary result)   Collection Time: 07/05/22  8:45 AM   Specimen: BLOOD  Result Value Ref Range Status   Specimen Description BLOOD HEMODIALYSIS LINE  Final   Special Requests   Final    BOTTLES DRAWN AEROBIC AND ANAEROBIC Blood Culture results may not be optimal due to an excessive volume of blood received in culture bottles   Culture   Final    NO GROWTH 2 DAYS Performed at [facility], [address] [place],  [address]    Report Status PENDING  Incomplete    Coagulation Studies: No results for input(s):  "LABPROT", "INR" in the last 72 hours.   Urinalysis: No results for input(s): "COLORURINE", "LABSPEC", "PHURINE", "GLUCOSEU", "HGBUR", "BILIRUBINUR", "KETONESUR", "PROTEINUR", "UROBILINOGEN", "NITRITE", "LEUKOCYTESUR" in the last 72 hours.  Invalid input(s): "APPERANCEUR"    Imaging: No results found.   Medications:    anticoagulant sodium citrate      acidophilus  1 capsule Oral Daily   amLODipine  10 mg Oral QHS   vitamin C  500 mg Oral BID   aspirin EC  81 mg Oral Daily   Chlorhexidine Gluconate Cloth  6 each Topical Q0600   feeding supplement (NEPRO CARB STEADY)  237 mL Oral BID BM   gabapentin  100 mg Oral TID   heparin injection (subcutaneous)  5,000 Units Subcutaneous Q8H   HYDROcodone-acetaminophen  1 tablet Oral UD   influenza vac split  quadrivalent PF  0.5 mL Intramuscular Tomorrow-1000   lipase/protease/amylase  72,000 Units Oral TID AC   mirtazapine  7.5 mg Oral QHS   multivitamin  1 tablet Oral Daily   pantoprazole  40 mg Oral BID AC   sevelamer carbonate  800 mg Oral TID WC   sevelamer carbonate  800 mg Oral Daily   zinc sulfate  220 mg Oral Daily   acetaminophen **OR** acetaminophen, albuterol, alteplase, anticoagulant sodium citrate, heparin, HYDROmorphone (DILAUDID) injection, hydrOXYzine, lidocaine (PF), lidocaine-prilocaine, lipase/protease/amylase, meclizine, ondansetron **OR** ondansetron (ZOFRAN) IV, pentafluoroprop-tetrafluoroeth, phenol, promethazine  Assessment/ Plan:  [name] is a 61 y.o.  female with past medical history of anemia, asthma, diabetes, CAD, PAD, GERD, and end stage renal disease on dialysis.  Patient arrived to emergency department via EMS after being found at home by grandson with multiple space heaters deterrent to high setting and patient wrapped in heated blanket.  Patient has been admitted for Colitis [K52.9] Acute colitis [K52.9] Heat exposure, initial encounter [T67.9XXA] Fever, unspecified fever cause [R50.9] Sepsis,  due to unspecified organism, unspecified whether acute organ dysfunction present ([place]) [A41.9]  [facility] DVA /TTS/Rt Permcath  End-stage renal disease on hemodialysis.  Will maintain outpatient schedule if possible.         Patient was last dialyzed on Saturday.        Patient will be dialyzed today     2. Anemia of chronic kidney disease Lab Results  Component Value Date   HGB 8.5 (L) 07/07/2022    Patient receives [place] outpatient. Hgb is not  at goal  3. Secondary Hyperparathyroidism: with outpatient labs: PTH 485, phosphorus 3.9, calcium 8.5 on 06/03/22.   Lab Results  Component Value Date   PTH 357 (H) 10/05/2018   CALCIUM 8.4 (L) 07/07/2022   CAION 1.09 (L) 04/13/2020   PHOS 4.9 (H) 12/03/2021   Calcium slightly decreased and phosphorus at target.  Continue Sevelamer.   4. Diabetes mellitus type II with chronic kidney disease/renal manifestations:noninsulin dependent.   Glucose well managed.  5.  Hypertension with chronic kidney disease.         Blood pressure Is stable.   Addendum Patient was seen on dialysis Patient tolerating treatment well   LOS: 6 Axie Hayne s Tahnee Cifuentes 10/17/20237:05 AM

## 2022-07-08 NOTE — Progress Notes (Signed)
Pt discharging to [facility] via EMS. Report called to [facility]. PIV removed. Belongings packed up. Discharge packet given to EMS transporters.

## 2022-07-08 NOTE — Progress Notes (Signed)
Dialysis tx completed. Tolerated well.  No signs of distress Removed 1.5L  84L processed.  Pt ran for 3.5hrs  b/p 141/58 report given to floor RN

## 2022-07-08 NOTE — Care Management Important Message (Signed)
Important Message  Patient Details  Name: [name] MRN: [id number] Date of Birth: Jan 23, 1961   Medicare Important Message Given:  Yes     [name] A Hatley Henegar 07/08/2022, 2:04 PM

## 2022-07-08 NOTE — Discharge Summary (Signed)
Physician Discharge Summary   Patient: [name] MRN: [id number] DOB: [birth year]  Admit date:     07/01/2022  Discharge date:   Discharge Physician: Jennye Boroughs   PCP: Denton Lank, MD   Recommendations at discharge:   Follow-up with PCP in 1 week Continue outpatient hemodialysis on Tuesday, Thursdays and Saturdays are scheduled Follow-up with gastroenterologist, Dr. Haig Prophet, as scheduled   Discharge Diagnoses: Principal Problem:   Acute colitis Active Problems:   Hypoglycemia in type 2 diabetes mellitus , without long-term current use of insulin (HCC)   Anemia in ESRD (end-stage renal disease) ([place])   End-stage renal disease on hemodialysis ([place])   HTN (hypertension)   Chronic diastolic CHF (congestive heart failure) ([place])   Debility  Resolved Problems:   * No resolved hospital problems. [facility] Course:  [name] is a 61 y.o. female with medical history significant for end-stage renal disease on hemodialysis, anemia of chronic disease, chronic diastolic heart failure, COPD, diabetes mellitus, GERD, reflux esophagitis, chronic intermittent dark/black stools, who presented to the hospital with heat exposure and confusion.  Reportedly, his son had done a wellness check on her and patient was found to be covered in multiple blankets and she had her heater turned up all the way and also had multiple space heaters that were working at the same time. Patient was initially cooled due to initial suspicion for hyperthermia since she had turned her furnace up at home.  Oral temp with EMS was 103.  She complained of abdominal pain on chronic/intermittent tach/black stools.  She said she has been scheduled for colonoscopy in December 2023.  Initial temperature in the ED was 102.3 F.  WBC was 9.3 with procalcitonin of 0.6 and lactate of 1.2.   An arterial blood gas revealed normal pH with PCO2 of 53.  Blood glucose was 45 on arrival but did improve on its own to 82.  CT head was  negative for acute findings.  CT scan of the abdomen showed Mild colonic wall thickening involving the descending, sigmoid,and to a lesser extent ascending colon with associated pericolonic edema, suspicious for colitis.    She was admitted to the hospital for acute colitis, fever and hypoglycemia.  She was treated with IV ceftriaxone and Flagyl.  She required IV dextrose for hypoglycemia.  Her condition improved and IV antibiotics were switched to oral Augmentin to complete 7-day course of treatment.  Her condition has improved.  She has chronic anemia and H&H has remained stable.  PT and OT recommended discharge to SNF.  She is deemed stable for discharge today.  She is very particular about her medicines so we went over all her home medicines prior to discharge.  She is agreeable with her discharge plan.      Consultants: Nephrologist Procedures performed: None  Disposition: Skilled nursing facility Diet recommendation:  Discharge Diet Orders (From admission, onward)     Start     Ordered   07/08/22 0000  Diet - low sodium heart healthy        07/08/22 1256           Renal diet DISCHARGE MEDICATION: Allergies as of 07/08/2022       Reactions   Ace Inhibitors Swelling, Anaphylaxis   Ativan [lorazepam] Other (See Comments)   Reaction:  Hallucinations and headaches   Compazine [prochlorperazine Edisylate] Anaphylaxis, Nausea And Vomiting, Other (See Comments)   Other reaction(s): dystonia from this vs. Reglan, 23 Jul - patient relates that  she takes promethazine frequently with no problems   Sumatriptan Succinate Other (See Comments)   Other reaction(s): delirium and hallucinations per [facility] records   Zofran [ondansetron] Nausea And Vomiting   Per pt. she is allergic to zofran or will experience adverse reaction like hallucination    Losartan Nausea Only   Prochlorperazine Other (See Comments)   Reaction:  Unknown . Patient does not remember reaction but she does have vertigo and  anxiety along with n and v at times. Could be used to treat any of these   Reglan [metoclopramide] Other (See Comments)   Per patient her Dr. Evelina Bucy her off it    Scopolamine Other (See Comments)   Dizziness, also has vertigo already   Tape Rash   Plastic tape causes rash   Tapentadol Rash   Ultrasound Gel Itching   Patient states the Ultrasound gel caused itching while in skin contact and resolved when wiped away.        Medication List     STOP taking these medications    atorvastatin 20 MG tablet Commonly known as: LIPITOR   nitroGLYCERIN 0.4 MG SL tablet Commonly known as: NITROSTAT   polyethylene glycol 17 g packet Commonly known as: MIRALAX / GLYCOLAX       TAKE these medications    acetaminophen 325 MG tablet Commonly known as: TYLENOL Take 2 tablets (650 mg total) by mouth every 6 (six) hours as needed for mild pain, headache, moderate pain or fever (fever >/= 101).   albuterol 108 (90 Base) MCG/ACT inhaler Commonly known as: VENTOLIN HFA Inhale 2 puffs into the lungs every 4 (four) hours as needed for wheezing or shortness of breath. What changed: Another medication with the same name was removed. Continue taking this medication, and follow the directions you see here.   alum & mag hydroxide-simeth 200-200-20 MG/5ML suspension Commonly known as: MAALOX/MYLANTA Take 15 mLs by mouth every 6 (six) hours as needed for indigestion or heartburn.   amLODipine 10 MG tablet Commonly known as: NORVASC Take 1 tablet (10 mg total) by mouth daily.   ammonium lactate 12 % lotion Commonly known as: LAC-HYDRIN Apply 1 application topically 2 (two) times daily as needed for dry skin.   aspirin EC 81 MG tablet Take 81 mg by mouth daily.   calcium carbonate 500 MG chewable tablet Commonly known as: TUMS - dosed in mg elemental calcium Chew 2.5 tablets (500 mg of elemental calcium total) by mouth every 6 (six) hours as needed for indigestion.   Creon 36000 UNITS Cpep  capsule Generic drug: lipase/protease/amylase Take 36,000 Units by mouth. Take 2 capsules with meals and 1 capsule with snacks   cyanocobalamin 1000 MCG tablet Take 1,000 mcg by mouth daily.   epoetin alfa 10000 UNIT/ML injection Commonly known as: EPOGEN Inject 1 mL (10,000 Units total) into the vein every Monday, Wednesday, and Friday with hemodialysis.   feeding supplement (NEPRO CARB STEADY) Liqd Take 237 mLs by mouth 2 (two) times daily between meals.   fluticasone 50 MCG/ACT nasal spray Commonly known as: FLONASE Place 2 sprays into both nostrils daily.   furosemide 80 MG tablet Commonly known as: LASIX Take 80 mg by mouth daily.   gabapentin 100 MG capsule Commonly known as: NEURONTIN Take 100 mg by mouth 3 (three) times daily.   hydrALAZINE 100 MG tablet Commonly known as: APRESOLINE Take 0.5 tablets (50 mg total) by mouth 2 (two) times daily.   HYDROcodone-acetaminophen 5-325 MG tablet Commonly known as: NORCO/VICODIN  Take 1 tablet by mouth as directed.   hydrOXYzine 25 MG tablet Commonly known as: ATARAX Take 25 mg by mouth 3 (three) times daily as needed.   Iron 325 (65 Fe) MG Tabs Take 325 mg by mouth daily.   lidocaine 5 % Commonly known as: LIDODERM Place 1 patch onto the skin daily. (Remove after 12 hours -- leave off for 12 hours before applying new patch)   meclizine 25 MG tablet Commonly known as: ANTIVERT Take 25 mg by mouth every 6 (six) hours as needed for dizziness.   mirtazapine 7.5 MG tablet Commonly known as: REMERON Take 7.5 mg by mouth daily.   multivitamin Tabs tablet Take 1 tablet by mouth daily.   mupirocin ointment 2 % Commonly known as: BACTROBAN Apply 1 application topically daily as needed (leg rash).   pantoprazole 40 MG tablet Commonly known as: PROTONIX Take 1 tablet (40 mg total) by mouth 2 (two) times daily. What changed: Another medication with the same name was removed. Continue taking this medication, and follow  the directions you see here.   PROBIOTIC PO Take 1 capsule by mouth daily.   promethazine 12.5 MG tablet Commonly known as: PHENERGAN Take 1 tablet (12.5 mg total) by mouth every 6 (six) hours as needed for nausea or vomiting.   sevelamer carbonate 800 MG tablet Commonly known as: RENVELA Take 800 mg by mouth 4 (four) times daily. (Take with meals and snack)   Vitamin D3 25 MCG (1000 UT) Caps Take 1,000 Units by mouth daily.               Discharge Care Instructions  (From admission, onward)           Start     Ordered   07/08/22 0000  Discharge wound care:       Comments: Apply Mepilex border to buttocks. Avoid laying or sitting on buttocks for too long   07/08/22 1256            Contact information for after-discharge care     Destination     [facility] Preferred SNF .   Service: Skilled Nursing Contact information: [address] [place] [phone]                    Discharge Exam: Danley Danker Weights   07/05/22 4196 07/08/22 0827 07/08/22 1215  Weight: 81.1 kg 79.6 kg 79.2 kg   GEN: NAD SKIN: Warm and dry EYES: No pallor or icterus ENT: MMM CV: RRR PULM: CTA B ABD: soft, ND, NT, +BS CNS: AAO x 3, non focal EXT: Bilateral lower extremity edema/leg lymphedema   Condition at discharge: good  The results of significant diagnostics from this hospitalization (including imaging, microbiology, ancillary and laboratory) are listed below for reference.   Imaging Studies: CT Abdomen Pelvis W Contrast  Result Date: 07/01/2022 CLINICAL DATA:  Acute abdominal pain. Fever. EXAM: CT ABDOMEN AND PELVIS WITH CONTRAST TECHNIQUE: Multidetector CT imaging of the abdomen and pelvis was performed using the standard protocol following bolus administration of intravenous contrast. RADIATION DOSE REDUCTION: This exam was performed according to the departmental dose-optimization program which includes automated  exposure control, adjustment of the mA and/or kV according to patient size and/or use of iterative reconstruction technique. CONTRAST:  112m OMNIPAQUE IOHEXOL 300 MG/ML  SOLN COMPARISON:  CT 04/28/2022 FINDINGS: Lower chest: Small bilateral pleural effusions. Subpleural reticulation in the lung bases, chronic. Upper normal heart size. Dialysis catheter tip in  the right atrium. Hepatobiliary: No focal hepatic lesion. Prior cholecystectomy. There is periportal edema. Patent portal vein. Pancreas: Fatty atrophy. There may be mild peripancreatic edema, although this is similar to generalized mesenteric edema. No evidence of pancreatic mass or ductal dilatation. Spleen: Normal in size without focal abnormality. Adrenals/Urinary Tract: No adrenal nodule. Bilateral renal parenchymal atrophy, patient with chronic renal disease. No hydronephrosis. Nondistended urinary bladder. Stomach/Bowel: Equivocal gastric wall thickening. Scattered fluid within small bowel without wall thickening or inflammatory change. No obstruction. The appendix is not definitively seen. There is mild colonic wall thickening involving the descending, sigmoid, and to a lesser extent ascending colon. Mild associated pericolonic edema. Diverticulosis without diverticulitis. Vascular/Lymphatic: There are enlarged bilateral inguinal lymph nodes, chronic but increasing from prior exam. Largest lymph node on the left measures 15 mm short axis. Largest lymph node on the right measures 11 mm. Prominent bilateral external iliac nodes. There are multiple prominent retroperitoneal nodes, largest measuring 12 mm short axis in the left periaortic space. Advanced aortic and branch atherosclerosis. No acute vascular findings. Reproductive: Status post hysterectomy. No adnexal masses. Other: Generalized body wall edema. Trace upper abdominal ascites. Generalized mesenteric edema. No free air or focal fluid collection. Musculoskeletal: Schmorl's node in superior  endplate of L2, chronic. Disc space at T11-T12 is likely congenital. Slight ground-glass density of the osseous structures consistent with renal osteodystrophy. No acute osseous findings. IMPRESSION: 1. Mild colonic wall thickening involving the descending, sigmoid, and to a lesser extent ascending colon with associated pericolonic edema, suspicious for colitis. This may be infectious or inflammatory. 2. Equivocal gastric wall thickening, can be seen with gastritis. 3. Small bilateral pleural effusions. Generalized body wall edema and trace upper abdominal ascites. Findings suggest third-spacing. 4. Bilateral inguinal and retroperitoneal adenopathy, likely reactive, but nonspecific. Inguinal lymph nodes have increased in size from CT 2 months ago. Recommend clinical and laboratory correlation for lymphoproliferative disorder. 5. Colonic diverticulosis without focal diverticulitis. Aortic Atherosclerosis (ICD10-I70.0). Electronically Signed   By: Keith Rake M.D.   On: 07/01/2022 23:44   DG Chest Port 1 View  Result Date: 07/01/2022 CLINICAL DATA:  Weakness EXAM: PORTABLE CHEST 1 VIEW COMPARISON:  CT chest dated 06/24/2022 FINDINGS: Subpleural reticulation/fibrosis in the lungs bilaterally, lower lobe predominant, chronic. Increased interstitial markings without frank interstitial edema. No definite pleural effusions. Heart is top-normal in size. Left subclavian stent. Right IJ dual lumen dialysis catheter terminates in the right atrium. IMPRESSION: Subpleural reticulation/fibrosis, lower lobe predominant, chronic. No evidence of acute cardiopulmonary disease. Electronically Signed   By: Julian Hy M.D.   On: 07/01/2022 21:12   CT Head Wo Contrast  Result Date: 07/01/2022 CLINICAL DATA:  Mental status change.  Fall. EXAM: CT HEAD WITHOUT CONTRAST TECHNIQUE: Contiguous axial images were obtained from the base of the skull through the vertex without intravenous contrast. RADIATION DOSE REDUCTION:  This exam was performed according to the departmental dose-optimization program which includes automated exposure control, adjustment of the mA and/or kV according to patient size and/or use of iterative reconstruction technique. COMPARISON:  CT head 04/28/2022 FINDINGS: Brain: No evidence of acute infarction, hemorrhage, hydrocephalus, extra-axial collection or mass lesion/mass effect. Vascular: Negative for hyperdense vessel Skull: Small lytic lesions in the calvarium bilaterally are chronic and unchanged including 2021 CT. Sinuses/Orbits: Paranasal sinuses clear. Bilateral cataract extraction Other: None IMPRESSION: Negative CT head. Electronically Signed   By: Franchot Gallo M.D.   On: 07/01/2022 21:06   CT CHEST WO CONTRAST  Result Date: 06/24/2022 CLINICAL DATA:  Chest  trauma from fall EXAM: CT CHEST WITHOUT CONTRAST TECHNIQUE: Multidetector CT imaging of the chest was performed following the standard protocol without IV contrast. RADIATION DOSE REDUCTION: This exam was performed according to the departmental dose-optimization program which includes automated exposure control, adjustment of the mA and/or kV according to patient size and/or use of iterative reconstruction technique. COMPARISON:  CT chest, abdomen and pelvis dated August 7th 2023 FINDINGS: Cardiovascular: Normal heart size. No pericardial effusion. Severe left main and three-vessel coronary artery calcifications. Normal caliber thoracic aorta with moderate calcified plaque. Right chest wall Vas-Cath with tip in the lower right atrium. Mediastinum/Nodes: Patulous esophagus. Thyroid is unremarkable. Enlarged mediastinal lymph nodes, slightly increased in size when compared with prior exam. Reference prevascular lymph node measuring 1.2 cm in short axis on series 2, image 52, previously 0.8 cm. Lungs/Pleura: Central airways are patent.Trace right-greater-than-left pleural effusions. Bibasilar atelectasis. Mild diffuse ground-glass opacities and  interlobular septal thickening. No consolidation or pneumothorax. Upper Abdomen: Atrophic bilateral kidneys. Simple appearing partially visualized left renal cysts, no specific follow-up imaging is recommended. Musculoskeletal: Soft tissue stranding seen in the subcutaneous tissues about the right chest wall dialysis catheter.No fracture is seen. IMPRESSION: 1. No acute traumatic findings seen in the chest. 2. Low mild diffuse ground-glass opacities and interlobular septal thickening, compatible with pulmonary edema. 3. Trace right-greater-than-left pleural effusions. 4. Enlarged mediastinal lymph nodes, slightly increased in size when compared with prior exam, likely reactive. 5. Soft tissue stranding seen in the subcutaneous tissues about the right chest wall dialysis catheter, possibly related to recent placement. Recommend clinical correlation. 6. Aortic Atherosclerosis (ICD10-I70.0). Electronically Signed   By: Yetta Glassman M.D.   On: 06/24/2022 08:23   DG Shoulder Right  Result Date: 06/24/2022 CLINICAL DATA:  [place] this morning and hit coffee table, RIGHT chest discomfort EXAM: RIGHT SHOULDER - 2+ VIEW COMPARISON:  03/30/2021 FINDINGS: Osseous demineralization. AC joint alignment normal. No acute fracture, dislocation, or bone destruction. Vascular stent proximal RIGHT upper arm. RIGHT jugular dialysis catheter. IMPRESSION: No acute osseous abnormalities. Electronically Signed   By: Lavonia Dana M.D.   On: 06/24/2022 08:22   PERIPHERAL VASCULAR CATHETERIZATION  Result Date: 06/17/2022 See surgical note for result.   Microbiology: Results for orders placed or performed during the hospital encounter of 07/01/22  Culture, blood (Routine x 2)     Status: None   Collection Time: 07/01/22 11:14 PM   Specimen: BLOOD  Result Value Ref Range Status   Specimen Description BLOOD BLOOD RIGHT HAND  Final   Special Requests   Final    BOTTLES DRAWN AEROBIC ONLY Blood Culture adequate volume   Culture    Final    NO GROWTH 5 DAYS Performed at [facility], [address], [place], [place] [address]    Report Status 07/06/2022 FINAL  Final  SARS Coronavirus 2 by RT PCR (hospital order, performed in [facility] hospital lab) *cepheid single result test* Anterior Nasal Swab     Status: None   Collection Time: 07/02/22 12:56 PM   Specimen: Anterior Nasal Swab  Result Value Ref Range Status   SARS Coronavirus 2 by RT PCR NEGATIVE NEGATIVE Final    Comment: (NOTE) SARS-CoV-2 target nucleic acids are NOT DETECTED.  The SARS-CoV-2 RNA is generally detectable in upper and lower respiratory specimens during the acute phase of infection. The lowest concentration of SARS-CoV-2 viral copies this assay can detect is 250 copies / mL. A negative result does not preclude SARS-CoV-2 infection and should not be  used as the sole basis for treatment or other patient management decisions.  A negative result may occur with improper specimen collection / handling, submission of specimen other than nasopharyngeal swab, presence of viral mutation(s) within the areas targeted by this assay, and inadequate number of viral copies (<250 copies / mL). A negative result must be combined with clinical observations, patient history, and epidemiological information.  Fact Sheet for Patients:   https://www.patel.info/  Fact Sheet for Healthcare Providers: https://hall.com/  This test is not yet approved or  cleared by the [place] FDA and has been authorized for detection and/or diagnosis of SARS-CoV-2 by FDA under an Emergency Use Authorization (EUA).  This EUA will remain in effect (meaning this test can be used) for the duration of the COVID-19 declaration under Section 564(b)(1) of the Act, 21 U.S.C. section 360bbb-3(b)(1), unless the authorization is terminated or revoked sooner.  Performed at [facility], [address]  [place], [place] [address]   Culture, blood (Routine X 2) w Reflex to ID Panel     Status: None (Preliminary result)   Collection Time: 07/05/22  8:45 AM   Specimen: BLOOD  Result Value Ref Range Status   Specimen Description BLOOD HEMODIALYSIS LINE  Final   Special Requests   Final    BOTTLES DRAWN AEROBIC AND ANAEROBIC Blood Culture results may not be optimal due to an excessive volume of blood received in culture bottles   Culture   Final    NO GROWTH 3 DAYS Performed at [facility], [address] [place], [place] [address]    Report Status PENDING  Incomplete  Culture, blood (Routine X 2) w Reflex to ID Panel     Status: None (Preliminary result)   Collection Time: 07/07/22  3:48 PM   Specimen: BLOOD  Result Value Ref Range Status   Specimen Description BLOOD BLOOD RIGHT HAND  Final   Special Requests   Final    BOTTLES DRAWN AEROBIC AND ANAEROBIC Blood Culture adequate volume   Culture   Final    NO GROWTH < 24 HOURS Performed at [facility], [address] [place], [place] [address]    Report Status PENDING  Incomplete    Labs: CBC: Recent Labs  Lab 07/01/22 2228 07/02/22 0605 07/05/22 0848 07/07/22 1535 07/08/22 0840  WBC 9.3 12.8* 4.0 4.7 5.0  NEUTROABS 6.5  --   --  2.0 2.1  HGB 9.2* 9.6* 7.6* 8.5* 7.8*  HCT 29.0* 29.1* 24.1* 27.0* 24.7*  MCV 90.1 86.4 88.9 89.1 89.2  PLT 95* 101* 99* 154 542   Basic Metabolic Panel: Recent Labs  Lab 07/01/22 2228 07/02/22 0605 07/05/22 0847 07/05/22 0848 07/07/22 1535 07/08/22 0840  NA 137 137  --  133* 134* 134*  K 4.7 3.5  --  3.1* 3.7 3.6  CL 96* 98  --  97* 96* 96*  CO2 28 27  --  '28 29 29  '$ GLUCOSE 45* 72  --  86 93 92  BUN 14 17  --  '17 13 16  '$ CREATININE 4.33* 4.56*  --  4.68* 4.65* 5.36*  CALCIUM 7.9* 7.6*  --  7.7* 8.4* 8.6*  MG  --   --  1.9  --  2.0  --   PHOS  --   --   --   --   --  2.3*   Liver Function Tests: Recent Labs  Lab 07/01/22 2228  AST 33  ALT 7  ALKPHOS 70  BILITOT 1.0  PROT 6.5  ALBUMIN 3.0*   CBG: Recent Labs  Lab 07/06/22 2100 07/06/22 2344 07/07/22 0637 07/07/22 1213 07/07/22 1704  GLUCAP 109* 115* 83 101* 97    Discharge time spent: greater than 30 minutes.  Signed: Jennye Boroughs, MD [facility] 07/08/2022

## 2022-07-09 LAB — VITAMIN A: Vitamin A (Retinoic Acid): 5.9 ug/dL — ABNORMAL LOW (ref 22.0–69.5)

## 2022-07-09 LAB — MISC LABCORP TEST (SEND OUT): Labcorp test code: 70097

## 2022-07-09 IMAGING — MG MM DIGITAL SCREENING BILAT W/ TOMO AND CAD
8 series · 9 of 24 positions shown · non-contrast
Comparison: Previous exam(s).

CLINICAL DATA: Screening.

EXAM:
DIGITAL SCREENING BILATERAL MAMMOGRAM WITH TOMOSYNTHESIS AND CAD
TECHNIQUE: Bilateral screening digital craniocaudal and mediolateral oblique
mammograms were obtained. Bilateral screening digital breast
tomosynthesis was performed. The images were evaluated with
computer-aided detection.

[L MLO synth-2D]
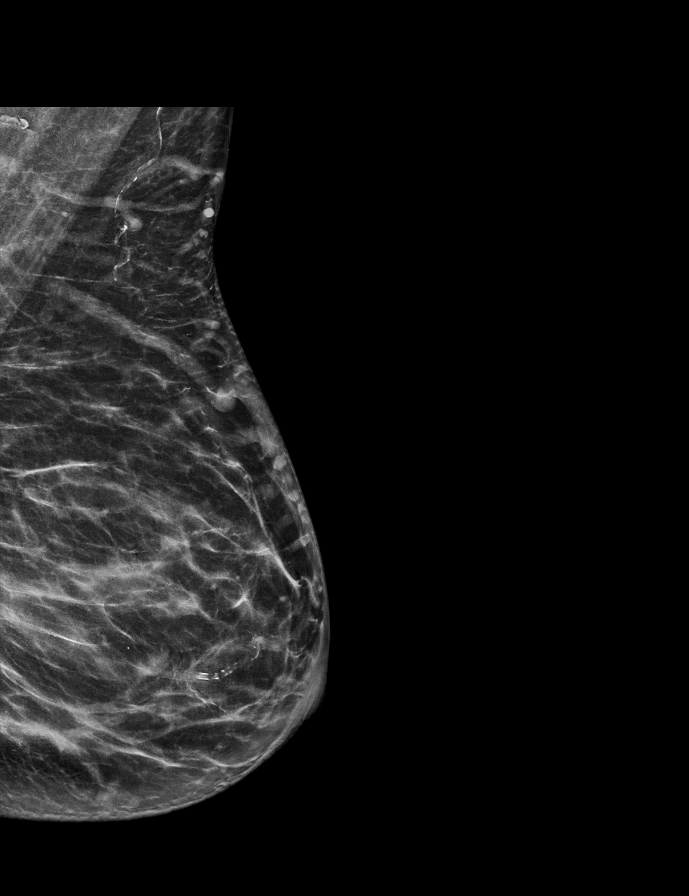

[R MLO synth-2D]
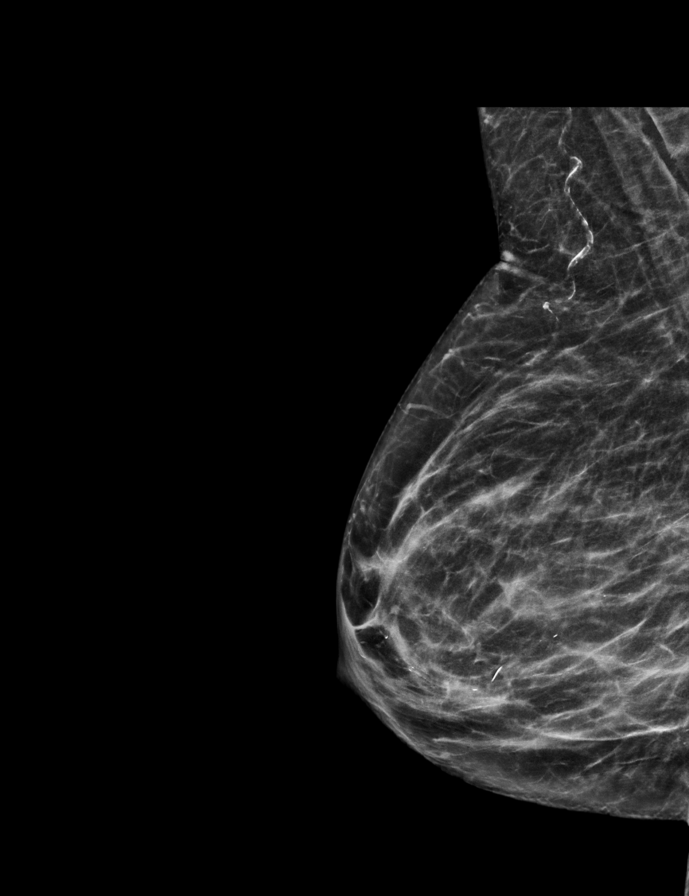

[L CC synth-2D]
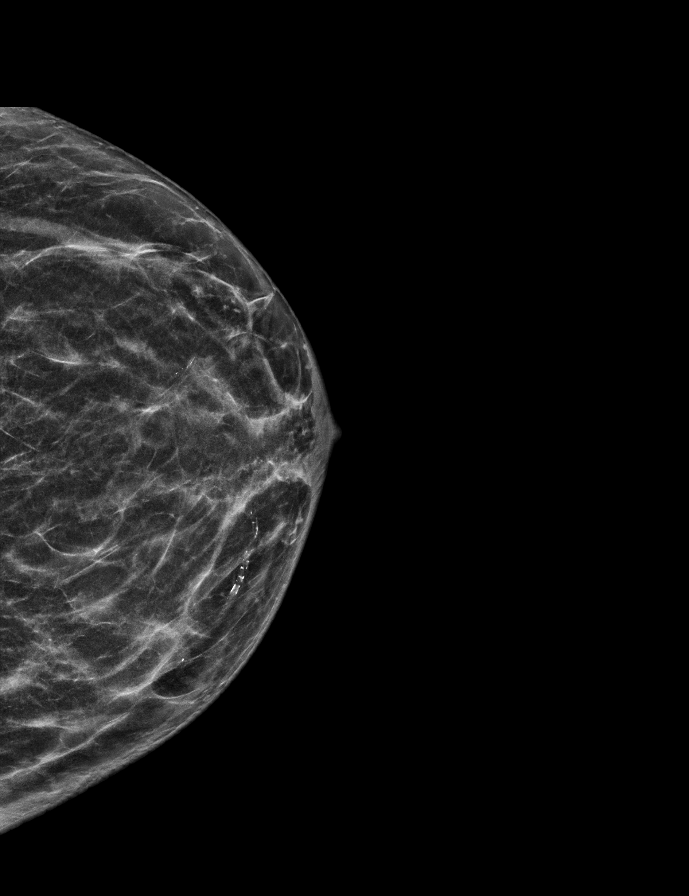

[R CC synth-2D]
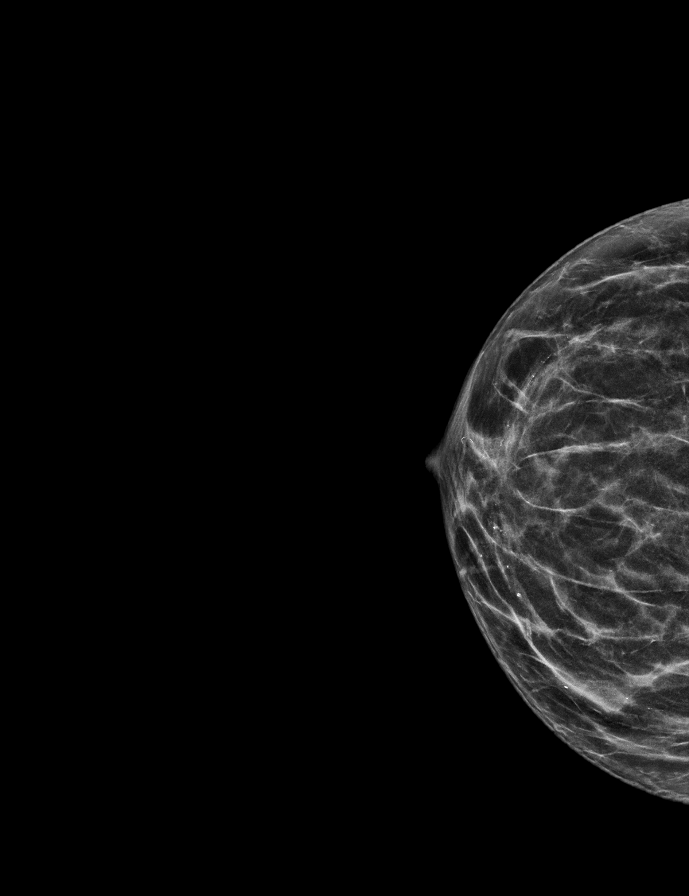

[R CC tomo · 2 of 45 frames shown]
[frame 15/45]
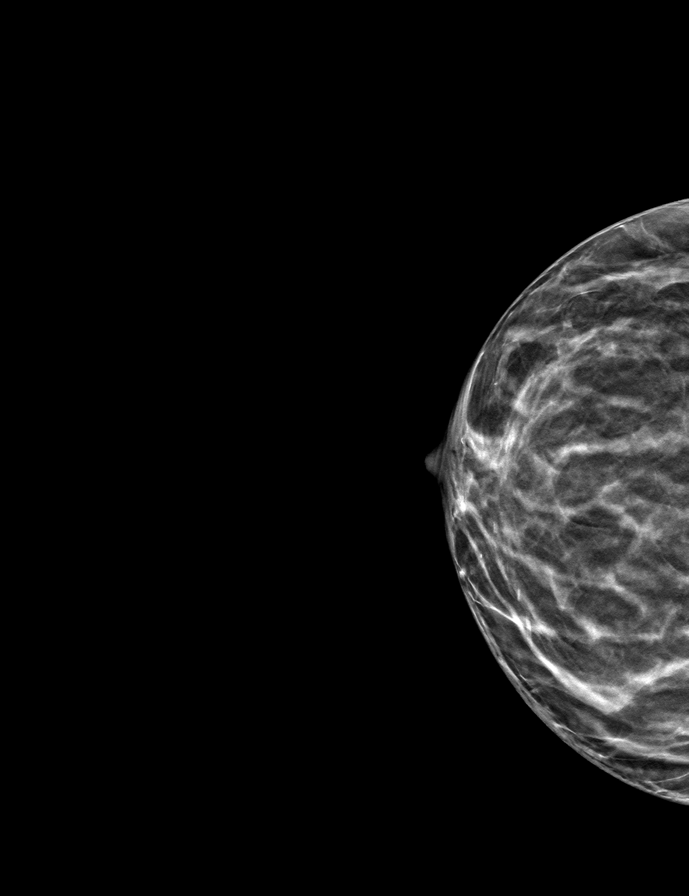
[frame 23/45]
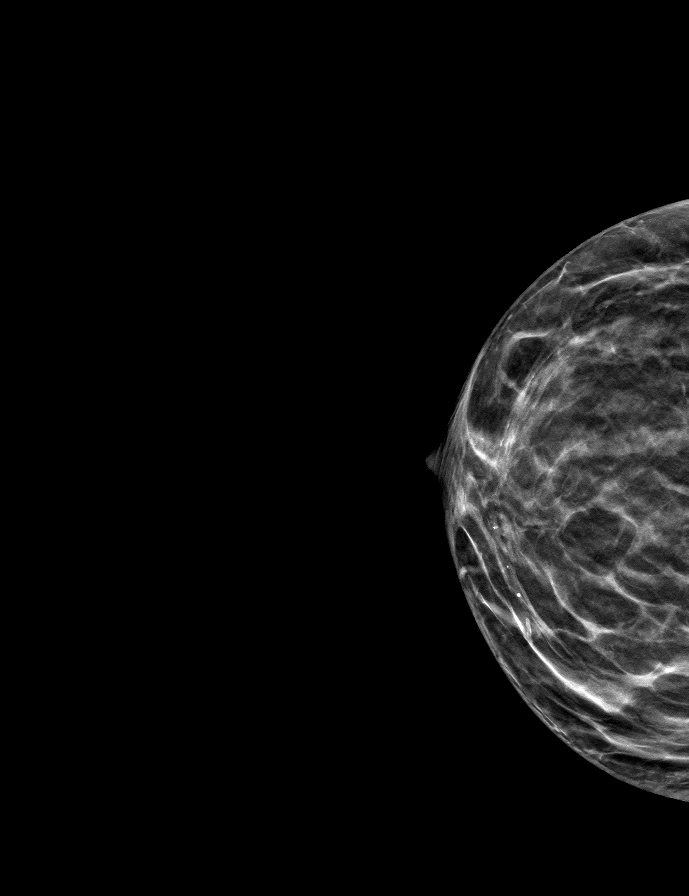

[L MLO tomo · tomo slice 31/61.0]
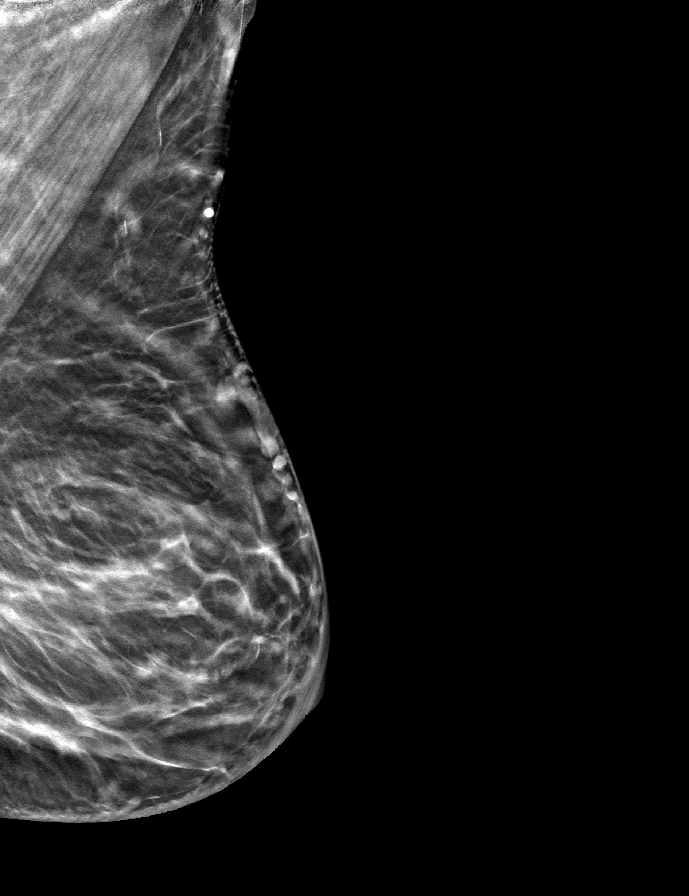

[L CC tomo · tomo slice 25/48.0]
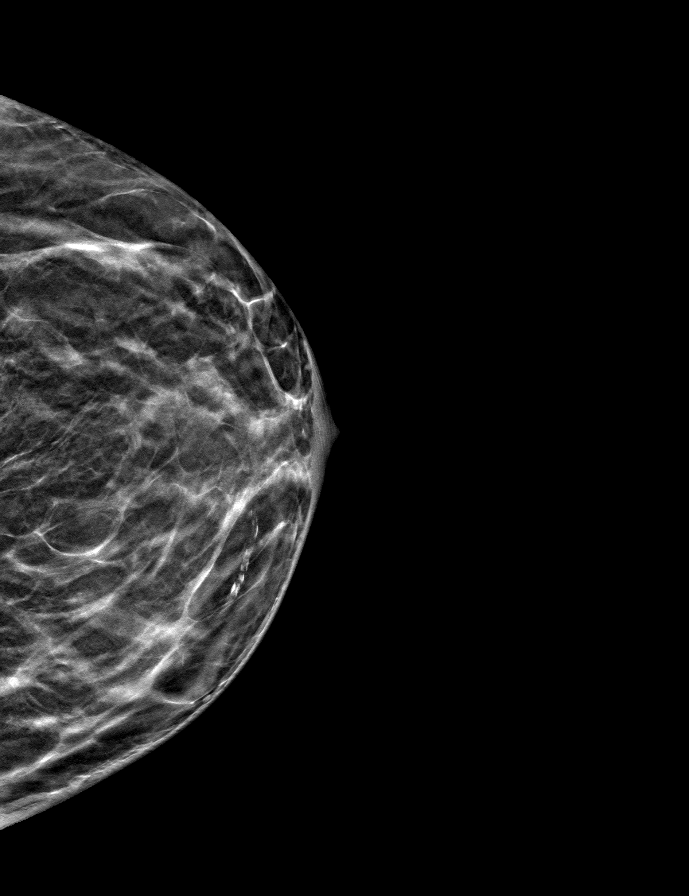

[R MLO tomo · tomo slice 29/56.0]
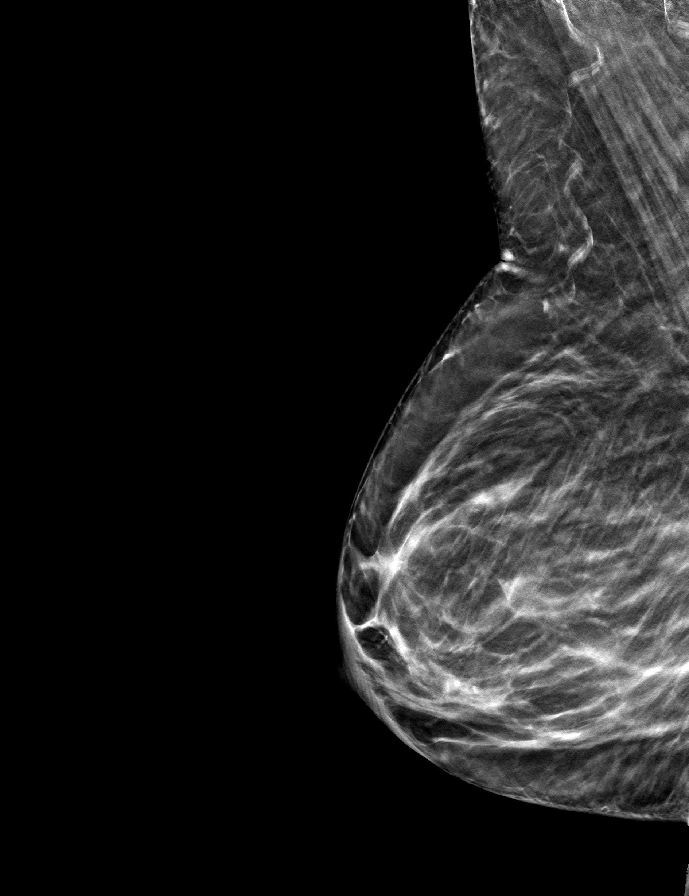

[9 of 24 positions shown; findings below may reference images not displayed]

ACR Breast Density Category c: The breast tissue is heterogeneously
dense, which may obscure small masses.
FINDINGS: There are no findings suspicious for malignancy.
IMPRESSION: No mammographic evidence of malignancy. A result letter of this
screening mammogram will be mailed directly to the patient.

RECOMMENDATION:
Screening mammogram in one year. (Code:[id number])

BI-RADS CATEGORY  1: Negative.

## 2022-07-10 LAB — CULTURE, BLOOD (ROUTINE X 2): Culture: NO GROWTH

## 2022-07-10 LAB — VITAMIN C: Vitamin C: 3.5 mg/dL — ABNORMAL HIGH (ref 0.4–2.0)

## 2022-07-12 LAB — CULTURE, BLOOD (ROUTINE X 2)
Culture: NO GROWTH
Special Requests: ADEQUATE

## 2022-07-12 LAB — MISC LABCORP TEST (SEND OUT): Labcorp test code: 821279

## 2022-07-14 LAB — ZINC: Zinc: 135 ug/dL — ABNORMAL HIGH (ref 44–115)

## 2022-07-23 LAB — MISC LABCORP TEST (SEND OUT)
LabCorp test name: 123220
Labcorp test code: 123220

## 2022-08-04 ENCOUNTER — Ambulatory Visit: Payer: Medicare Other | Admitting: Physician Assistant

## 2022-08-11 ENCOUNTER — Ambulatory Visit: Payer: Medicare Other | Admitting: Physician Assistant

## 2022-08-16 ENCOUNTER — Other Ambulatory Visit: Payer: Self-pay

## 2022-08-16 ENCOUNTER — Emergency Department: Payer: Medicare Other

## 2022-08-16 ENCOUNTER — Emergency Department
Admission: EM | Admit: 2022-08-16 | Discharge: 2022-08-16 | Disposition: A | Discharge status: Home / Self Care | Payer: Medicare Other | Attending: Student in an Organized Health Care Education/Training Program | Admitting: Student in an Organized Health Care Education/Training Program

## 2022-08-16 DIAGNOSIS — Z20822 Contact with and (suspected) exposure to covid-19: Secondary | ICD-10-CM | POA: Diagnosis not present

## 2022-08-16 DIAGNOSIS — R519 Headache, unspecified: Secondary | ICD-10-CM | POA: Insufficient documentation

## 2022-08-16 DIAGNOSIS — H6692 Otitis media, unspecified, left ear: Secondary | ICD-10-CM | POA: Insufficient documentation

## 2022-08-16 DIAGNOSIS — H9202 Otalgia, left ear: Secondary | ICD-10-CM | POA: Diagnosis present

## 2022-08-16 DIAGNOSIS — Z992 Dependence on renal dialysis: Secondary | ICD-10-CM | POA: Insufficient documentation

## 2022-08-16 DIAGNOSIS — N186 End stage renal disease: Secondary | ICD-10-CM | POA: Diagnosis not present

## 2022-08-16 DIAGNOSIS — H6690 Otitis media, unspecified, unspecified ear: Secondary | ICD-10-CM

## 2022-08-16 LAB — CBC
HCT: 24.6 % — ABNORMAL LOW (ref 36.0–46.0)
Hemoglobin: 8.4 g/dL — ABNORMAL LOW (ref 12.0–15.0)
MCH: 26.7 pg (ref 26.0–34.0)
MCHC: 34.1 g/dL (ref 30.0–36.0)
MCV: 78.1 fL — ABNORMAL LOW (ref 80.0–100.0)
Platelets: 162 10*3/uL (ref 150–400)
RBC: 3.15 MIL/uL — ABNORMAL LOW (ref 3.87–5.11)
RDW: 17.2 % — ABNORMAL HIGH (ref 11.5–15.5)
WBC: 3.9 10*3/uL — ABNORMAL LOW (ref 4.0–10.5)
nRBC: 0 % (ref 0.0–0.2)

## 2022-08-16 LAB — BASIC METABOLIC PANEL
Anion gap: 11 (ref 5–15)
BUN: 12 mg/dL (ref 8–23)
CO2: 27 mmol/L (ref 22–32)
Calcium: 8.4 mg/dL — ABNORMAL LOW (ref 8.9–10.3)
Chloride: 99 mmol/L (ref 98–111)
Creatinine, Ser: 5.07 mg/dL — ABNORMAL HIGH (ref 0.44–1.00)
GFR, Estimated: 9 mL/min — ABNORMAL LOW (ref >60)
Glucose, Bld: 80 mg/dL (ref 70–99)
Potassium: 3.3 mmol/L — ABNORMAL LOW (ref 3.5–5.1)
Sodium: 137 mmol/L (ref 135–145)

## 2022-08-16 LAB — RESP PANEL BY RT-PCR (FLU A&B, COVID) ARPGX2
Influenza A by PCR: NEGATIVE
Influenza B by PCR: NEGATIVE
SARS Coronavirus 2 by RT PCR: NEGATIVE

## 2022-08-16 LAB — TROPONIN I (HIGH SENSITIVITY): Troponin I (High Sensitivity): 9 ng/L (ref <18)

## 2022-08-16 MED ORDER — CHLORHEXIDINE GLUCONATE CLOTH 2 % EX PADS
6.0000 | MEDICATED_PAD | Freq: Every day | CUTANEOUS | Status: DC
  Filled 2022-08-16: qty 6

## 2022-08-16 MED ORDER — AMOXICILLIN 500 MG PO CAPS: 500.0000 mg | ORAL_CAPSULE | Freq: Every day | ORAL | 0 refills | Status: AC

## 2022-08-16 MED ORDER — AMOXICILLIN 500 MG PO CAPS
500.0000 mg | ORAL_CAPSULE | Freq: Once | ORAL | Status: AC
  Administered 2022-08-16: 500 mg via ORAL
  Filled 2022-08-16: qty 1

## 2022-08-16 NOTE — ED Triage Notes (Signed)
First Nurse Note: Pt from home BIB [facility], missed dialysis Thursday (Thanksgiving Day), typically T,T,S dialysis, pt c/o CP, pt crying in pain, per pt's son when she acts like this is d/t her being "cold". CBG 70, EMS gave oral gulose. HTN noted per EMS, NSR on the monitor.

## 2022-08-16 NOTE — Progress Notes (Signed)
[facility]  ROUNDING NOTE   Subjective:   [name] is a 61 y.o., female with past medical history of anemia, asthma, diabetes, CAD, PAD, GERD, and end stage renal disease on dialysis.  Patient arrived to emergency department via EMS after being found at home by grandson with multiple space heaters deterrent to high setting and patient wrapped in heated blanket.  Patient has been admitted for Chest Pain-EMS  Patient is known to our practice and receives outpatient dialysis treatments at [facility] on a TTS schedule, supervised by Dr. Holley Raring.    Nephrology was consulted for meant of dialysis . Patient was seen in the ER.   Patient main concern was that for ear was hurting.    Objective:  Vital signs in last 24 hours:  Temp:  [97.7 F (36.5 C)-97.8 F (36.6 C)] 97.7 F (36.5 C) (11/25 1441) Pulse Rate:  [56-63] 56 (11/25 1441) Resp:  [10-18] 12 (11/25 1441) BP: (134-177)/(61-78) 171/63 (11/25 1441) SpO2:  [97 %-100 %] 100 % (11/25 1441) Weight:  [79.2 kg] 79.2 kg (11/25 0639)  Weight change:  Filed Weights   08/16/22 0639  Weight: 79.2 kg    Intake/Output: No intake/output data recorded.   Intake/Output this shift:  Total I/O In: -  Out: 2 [Other:2]  Physical Exam: General: NAD  Head: Normocephalic, atraumatic. Moist oral mucosal membranes  Eyes: Anicteric  Lungs:  Clear to auscultation. Normal effort, [place] O2  Heart: Regular rate and rhythm  Abdomen:  Soft, nontender  Extremities:  trace peripheral edema.Chronic venous stasis changes  Neurologic: Nonfocal, moving all four extremities  Skin: No lesions, dry  Access: Rt permcath    Basic Metabolic Panel: Recent Labs  Lab 08/16/22 0737  NA 137  K 3.3*  CL 99  CO2 27  GLUCOSE 80  BUN 12  CREATININE 5.07*  CALCIUM 8.4*    Liver Function Tests: No results for input(s): "AST", "ALT", "ALKPHOS", "BILITOT", "PROT", "ALBUMIN" in the last 168 hours.  No results for input(s): "LIPASE",  "AMYLASE" in the last 168 hours.  No results for input(s): "AMMONIA" in the last 168 hours.  CBC: Recent Labs  Lab 08/16/22 0737  WBC 3.9*  HGB 8.4*  HCT 24.6*  MCV 78.1*  PLT 162    Cardiac Enzymes: No results for input(s): "CKTOTAL", "CKMB", "CKMBINDEX", "TROPONINI" in the last 168 hours.   BNP: Invalid input(s): "POCBNP"  CBG: No results for input(s): "GLUCAP" in the last 168 hours.   Microbiology: Results for orders placed or performed during the hospital encounter of 08/16/22  Resp Panel by RT-PCR (Flu A&B, Covid) Anterior Nasal Swab     Status: None   Collection Time: 08/16/22  9:39 AM   Specimen: Anterior Nasal Swab  Result Value Ref Range Status   SARS Coronavirus 2 by RT PCR NEGATIVE NEGATIVE Final    Comment: (NOTE) SARS-CoV-2 target nucleic acids are NOT DETECTED.  The SARS-CoV-2 RNA is generally detectable in upper respiratory specimens during the acute phase of infection. The lowest concentration of SARS-CoV-2 viral copies this assay can detect is 138 copies/mL. A negative result does not preclude SARS-Cov-2 infection and should not be used as the sole basis for treatment or other patient management decisions. A negative result may occur with  improper specimen collection/handling, submission of specimen other than nasopharyngeal swab, presence of viral mutation(s) within the areas targeted by this assay, and inadequate number of viral copies(<138 copies/mL). A negative result must be combined with clinical observations, patient  history, and epidemiological information. The expected result is Negative.  Fact Sheet for Patients:  EntrepreneurPulse.com.au  Fact Sheet for Healthcare Providers:  IncredibleEmployment.be  This test is no t yet approved or cleared by the [place] FDA and  has been authorized for detection and/or diagnosis of SARS-CoV-2 by FDA under an Emergency Use Authorization (EUA). This EUA  will remain  in effect (meaning this test can be used) for the duration of the COVID-19 declaration under Section 564(b)(1) of the Act, 21 U.S.C.section 360bbb-3(b)(1), unless the authorization is terminated  or revoked sooner.       Influenza A by PCR NEGATIVE NEGATIVE Final   Influenza B by PCR NEGATIVE NEGATIVE Final    Comment: (NOTE) The Xpert Xpress SARS-CoV-2/FLU/RSV plus assay is intended as an aid in the diagnosis of influenza from Nasopharyngeal swab specimens and should not be used as a sole basis for treatment. Nasal washings and aspirates are unacceptable for Xpert Xpress SARS-CoV-2/FLU/RSV testing.  Fact Sheet for Patients: EntrepreneurPulse.com.au  Fact Sheet for Healthcare Providers: IncredibleEmployment.be  This test is not yet approved or cleared by the [place] FDA and has been authorized for detection and/or diagnosis of SARS-CoV-2 by FDA under an Emergency Use Authorization (EUA). This EUA will remain in effect (meaning this test can be used) for the duration of the COVID-19 declaration under Section 564(b)(1) of the Act, 21 U.S.C. section 360bbb-3(b)(1), unless the authorization is terminated or revoked.  Performed at [facility], [address] [place], [place] [address]     Coagulation Studies: No results for input(s): "LABPROT", "INR" in the last 72 hours.   Urinalysis: No results for input(s): "COLORURINE", "LABSPEC", "PHURINE", "GLUCOSEU", "HGBUR", "BILIRUBINUR", "KETONESUR", "PROTEINUR", "UROBILINOGEN", "NITRITE", "LEUKOCYTESUR" in the last 72 hours.  Invalid input(s): "APPERANCEUR"    Imaging: CT HEAD WO CONTRAST (5MM)  Result Date: 08/16/2022 CLINICAL DATA:  Worsening headache. EXAM: CT HEAD WITHOUT CONTRAST TECHNIQUE: Contiguous axial images were obtained from the base of the skull through the vertex without intravenous contrast. RADIATION DOSE REDUCTION: This exam was performed  according to the departmental dose-optimization program which includes automated exposure control, adjustment of the mA and/or kV according to patient size and/or use of iterative reconstruction technique. COMPARISON:  07/01/2022 FINDINGS: Brain: There is no evidence for acute hemorrhage, hydrocephalus, mass lesion, or abnormal extra-axial fluid collection. No definite CT evidence for acute infarction. Vascular: No hyperdense vessel or unexpected calcification. Skull: No evidence for fracture. No worrisome lytic or sclerotic lesion. Sinuses/Orbits: Interval development of the left mastoid effusion with fluid visible in the left middle ear. Right mastoid air cells and middle ear are clear. Visualized paranasal sinuses are clear. Visualized portions of the globes and intraorbital fat are unremarkable. Other: None. IMPRESSION: 1. No acute intracranial abnormality. 2. Since 07/01/2022, interval development of left mastoid effusion with fluid visible in the left middle ear. Electronically Signed   By: Misty Stanley M.D.   On: 08/16/2022 10:40   DG Chest 2 View  Result Date: 08/16/2022 CLINICAL DATA:  Chest pain. EXAM: CHEST - 2 VIEW COMPARISON:  07/01/2022 FINDINGS: Right IJ dialysis catheter is identified with tip in the right atrium. Left subclavian vein stent. Stable cardiomediastinal contours. Small bilateral pleural effusions, left greater than right. Mild diffuse interstitial edema. No airspace disease. IMPRESSION: Mild congestive heart failure. Electronically Signed   By: Kerby Moors M.D.   On: 08/16/2022 07:53     Medications:      Chlorhexidine Gluconate Cloth  6 each Topical Q0600  Assessment/ Plan:  [name] is a 61 y.o.  female with past medical history of anemia, asthma, diabetes, CAD, PAD, GERD, and end stage renal disease on dialysis.  Patient arrived to emergency department via EMS after being found at home by grandson with multiple space heaters deterrent to high setting and  patient wrapped in heated blanket.  Patient has been admitted for Chest Pain-EMS  [facility]/TTS/Rt Permcath  End-stage renal disease on hemodialysis.     .    Patient is on Tuesday Thursday Saturday schedule.        Patient will be dialyzed today     2. Anemia of chronic kidney disease Lab Results  Component Value Date   HGB 8.4 (L) 08/16/2022    Patient receives [place] outpatient. Hgb is not  at goal  3. Secondary Hyperparathyroidism: with outpatient labs: PTH 485, phosphorus 3.9, calcium 8.5 on 06/03/22.   Lab Results  Component Value Date   PTH 357 (H) 10/05/2018   CALCIUM 8.4 (L) 08/16/2022   CAION 1.09 (L) 04/13/2020   PHOS 2.3 (L) 07/08/2022   Calcium slightly decreased and phosphorus at target.  Continue Sevelamer.   4. Diabetes mellitus type II with chronic kidney disease/renal manifestations:noninsulin dependent.   Glucose well managed.  5.  Hypertension with chronic kidney disease.     Patient blood pressure is uncontrolled    Educated patient to please take her home medications.     LOS: 0 Ardith Test s [facility] 11/25/20234:09 PM

## 2022-08-16 NOTE — ED Provider Notes (Signed)
[facility] Provider Note    Event Date/Time   First MD Initiated Contact with Patient 08/16/22 0932     (approximate)   History   Chest Pain   HPI  ALEXIE LANNI is a 61 y.o. female extensive and complex past medical history well-known to this facility presents to the ER for evaluation of feeling "cold.  "Symptoms have been occurring over the past 24 to 48 hours.  Son who is with the patient states that this will occur with increasing frequency since she was diagnosed with COVID.  She is also been having some left ear pain as well as headache.  Today started having some chest discomfort and shortness of breath last night.  She did miss Thursday dialysis and is due for dialysis today but missed it.     Physical Exam   Triage Vital Signs: ED Triage Vitals  Enc Vitals Group     BP 08/16/22 0651 134/68     Pulse Rate 08/16/22 0651 (!) 57     Resp 08/16/22 0651 18     Temp 08/16/22 0651 97.8 F (36.6 C)     Temp Source 08/16/22 0651 Oral     SpO2 08/16/22 0632 97 %     Weight 08/16/22 0639 174 lb 9.7 oz (79.2 kg)     Height 08/16/22 0639 '5\' 5"'$  (1.651 m)     Head Circumference --      Peak Flow --      Pain Score 08/16/22 0639 10     Pain Loc --      Pain Edu? --      Excl. in [place]? --     Most recent vital signs: Vitals:   08/16/22 1126 08/16/22 1140  BP:  (!) 167/78  Pulse:  62  Resp:  12  Temp: 97.7 F (36.5 C)   SpO2: 99% 99%     Constitutional: Alert  Eyes: Conjunctivae are normal.  Head: Atraumatic. Nose: No congestion/rhinnorhea. Mouth/Throat: Mucous membranes are moist.  Left tm dull and sunken. No mastoid erythema or swelling Neck: Painless ROM.  Cardiovascular:   Good peripheral circulation. Respiratory: Normal respiratory effort.  No retractions.  Gastrointestinal: Soft and nontender.  Musculoskeletal:  no deformity Neurologic:  MAE spontaneously. No gross focal neurologic deficits are appreciated.  Skin:  Skin is warm, dry  and intact. No rash noted. Psychiatric: Mood and affect are normal. Speech and behavior are normal.    ED Results / Procedures / Treatments   Labs (all labs ordered are listed, but only abnormal results are displayed) Labs Reviewed  BASIC METABOLIC PANEL - Abnormal; Notable for the following components:      Result Value   Potassium 3.3 (*)    Creatinine, Ser 5.07 (*)    Calcium 8.4 (*)    GFR, Estimated 9 (*)    All other components within normal limits  CBC - Abnormal; Notable for the following components:   WBC 3.9 (*)    RBC 3.15 (*)    Hemoglobin 8.4 (*)    HCT 24.6 (*)    MCV 78.1 (*)    RDW 17.2 (*)    All other components within normal limits  RESP PANEL BY RT-PCR (FLU A&B, COVID) [id number]  TROPONIN I (HIGH SENSITIVITY)     EKG  ED ECG REPORT I, Merlyn Lot, the attending physician, personally viewed and interpreted this ECG.   Date: 08/16/2022  EKG Time: 6:37  Rate: 60  Rhythm: sinus  Axis: normal  Intervals: normal  ST&T Change: no stemi, no depressions    RADIOLOGY Please see ED Course for my review and interpretation.  I personally reviewed all radiographic images ordered to evaluate for the above acute complaints and reviewed radiology reports and findings.  These findings were personally discussed with the patient.  Please see medical record for radiology report.    PROCEDURES:  Critical Care performed: No  Procedures   MEDICATIONS ORDERED IN ED: Medications  Chlorhexidine Gluconate Cloth 2 % PADS 6 each (has no administration in time range)  amoxicillin (AMOXIL) capsule 500 mg (500 mg Oral Given 08/16/22 1055)     IMPRESSION / MDM / ASSESSMENT AND PLAN / ED COURSE  I reviewed the triage vital signs and the nursing notes.                              Differential diagnosis includes, but is not limited to, aom, uri, sepsis, chf, edema, noncompliance, acs  Patient presenting to the ER for evaluation of symptoms as described  above.  Based on symptoms, risk factors and considered above differential, this presenting complaint could reflect a potentially life-threatening illness therefore the patient will be placed on continuous pulse oximetry and telemetry for monitoring.  Laboratory evaluation will be sent to evaluate for the above complaints.     Clinical Course as of 08/16/22 1157  Sat Aug 16, 2022  1029 Chest x-ray on my review and interpretation without evidence of pneumothorax.  CT head my review and interpretation without evidence of bleed. [PR]  [id number] Patient does have evidence of acute left otitis.  Does not have mastoiditis.  Will put on amoxicillin.  She does not have an allergy to this.  She missed dialysis and is due for today I consulted with Dr. Theador Hawthorne of nephrology who will arrange dialysis here in the hospital after which I do anticipate she will be appropriate for discharge. [PR]    Clinical Course User Index [PR] Merlyn Lot, MD      FINAL CLINICAL IMPRESSION(S) / ED DIAGNOSES   Final diagnoses:  ESRD on dialysis ([place])  Acute otitis media, unspecified otitis media type     Rx / DC Orders   ED Discharge Orders          Ordered    amoxicillin (AMOXIL) 500 MG capsule  Daily        08/16/22 1155             Note:  This document was prepared using Dragon voice recognition software and may include unintentional dictation errors.    Merlyn Lot, MD 08/16/22 1157

## 2022-08-16 NOTE — ED Notes (Addendum)
Unable to obtain blood work x2. Attempted to call lab, no answer from lab. Will attempt again

## 2022-08-16 NOTE — Progress Notes (Signed)
PT did 3 hrs of HD, tolerated well with no issues  UF = 2000 ml    08/16/22 1441  Vitals  Temp 97.7 F (36.5 C)  Temp Source Oral  BP (!) 171/63  MAP (mmHg) 92  BP Location Left Arm  BP Method Automatic  Patient Position (if appropriate) Lying  Pulse Rate (!) 56  Pulse Rate Source Monitor  ECG Heart Rate (!) 56  Resp 12  Oxygen Therapy  SpO2 100 %  O2 Device Nasal Cannula  O2 Flow Rate (L/min) 2 L/min  Patient Activity (if Appropriate) In bed  Pulse Oximetry Type Continuous  During Treatment Monitoring  Intra-Hemodialysis Comments Tolerated well;Tx completed  Post Treatment  Dialyzer Clearance Lightly streaked  Duration of HD Treatment -hour(s) 3 hour(s)  Liters Processed 54  Fluid Removed (mL) 2 mL  Tolerated HD Treatment Yes  Hemodialysis Catheter Right Internal jugular Double lumen Permanent (Tunneled)  Placement Date/Time: 06/17/22 1653   Serial / Lot #: [id number]  Expiration Date: 12/25/26  Time Out: Correct patient;Correct site;Correct procedure  Maximum sterile barrier precautions: Hand hygiene;Cap;Mask;Sterile gown;Sterile gloves;Large sterile ...  Site Condition No complications  Blue Lumen Status Blood return noted;Flushed;Heparin locked;Dead end cap in place  Red Lumen Status Blood return noted;Flushed;Heparin locked;Dead end cap in place  Purple Lumen Status N/A  Catheter fill solution Heparin 1000 units/ml  Catheter fill volume (Arterial) 1.6 cc  Catheter fill volume (Venous) 1.6  Dressing Type Transparent  Dressing Status Antimicrobial disc in place;Clean, Dry, Intact  Drainage Description None  Dressing Change Due 08/23/22  Post treatment catheter status Capped and Clamped

## 2022-08-16 NOTE — ED Triage Notes (Addendum)
Pt comes from home via [facility] c/o chest pain and "being cold". Pt states pain started today. Pt yelling out in triage. Pt on 2L [place] 02 chronically. NAD at this time. Hx of HTN, CAD, CHF

## 2022-08-16 NOTE — ED Notes (Signed)
Pt changed into sweatsuit w/ son's assistance

## 2022-08-16 NOTE — ED Notes (Signed)
Son en route

## 2022-08-16 NOTE — ED Notes (Addendum)
Pt states she called her son to pick her up with change of clothes

## 2022-08-16 NOTE — ED Notes (Signed)
Lab to come draw labs per phone conversation.

## 2022-08-20 ENCOUNTER — Encounter: Payer: Medicare Other | Attending: Internal Medicine | Admitting: Internal Medicine

## 2022-08-20 DIAGNOSIS — I5033 Acute on chronic diastolic (congestive) heart failure: Secondary | ICD-10-CM | POA: Diagnosis not present

## 2022-08-20 DIAGNOSIS — X58XXXA Exposure to other specified factors, initial encounter: Secondary | ICD-10-CM | POA: Insufficient documentation

## 2022-08-20 DIAGNOSIS — N186 End stage renal disease: Secondary | ICD-10-CM | POA: Insufficient documentation

## 2022-08-20 DIAGNOSIS — I890 Lymphedema, not elsewhere classified: Secondary | ICD-10-CM | POA: Insufficient documentation

## 2022-08-20 DIAGNOSIS — J449 Chronic obstructive pulmonary disease, unspecified: Secondary | ICD-10-CM | POA: Diagnosis not present

## 2022-08-20 DIAGNOSIS — Z87891 Personal history of nicotine dependence: Secondary | ICD-10-CM | POA: Diagnosis not present

## 2022-08-20 DIAGNOSIS — S81801A Unspecified open wound, right lower leg, initial encounter: Secondary | ICD-10-CM | POA: Diagnosis not present

## 2022-08-20 DIAGNOSIS — I132 Hypertensive heart and chronic kidney disease with heart failure and with stage 5 chronic kidney disease, or end stage renal disease: Secondary | ICD-10-CM | POA: Diagnosis not present

## 2022-08-20 DIAGNOSIS — Z992 Dependence on renal dialysis: Secondary | ICD-10-CM | POA: Insufficient documentation

## 2022-08-20 DIAGNOSIS — E1122 Type 2 diabetes mellitus with diabetic chronic kidney disease: Secondary | ICD-10-CM | POA: Diagnosis not present

## 2022-08-20 DIAGNOSIS — E1129 Type 2 diabetes mellitus with other diabetic kidney complication: Secondary | ICD-10-CM

## 2022-08-20 NOTE — Progress Notes (Addendum)
Robin, [name] ([id number]) [id number] Page 1 of 5 Visit Report for 08/20/2022 Abuse Risk Screen Details Patient Name: Date of Service: [name], Robin 08/20/2022 1:00 PM Medical Record Number: [id number] Patient Account Number: [id number] Date of Birth/Sex: Treating RN: 08-13-1961 (61 y.o. Female) Rosalio Loud Primary Care Cy Bresee: Denton Lank Other Clinician: Referring Devynn Scheff: Treating Sharnice Bosler/Extender: Tobias Alexander Weeks in Treatment: 0 Abuse Risk Screen Items Answer Electronic Signature(s) Signed: 08/20/2022 4:57:00 PM By: Rosalio Loud MSN RN CNS WTA Previous Signature: 08/20/2022 2:02:44 PM Version By: Rosalio Loud MSN RN CNS WTA Entered By: Rosalio Loud on 08/20/2022 14:26:50 -------------------------------------------------------------------------------- Activities of Daily Living Details Patient Name: Date of Service: Robin, [name] 08/20/2022 1:00 PM Medical Record Number: [id number] Patient Account Number: [id number] Date of Birth/Sex: Treating RN: [birth year] (61 y.o. Female) Rosalio Loud Primary Care Laguana Desautel: [place] Lank Other Clinician: Referring Galilee Pierron: Treating Epiphany Seltzer/Extender: Tobias Alexander Weeks in Treatment: 0 Activities of Daily Living Items Answer Activities of Daily Living (Please select one for each item) Drive Automobile Not Able T Medications ake Completely Able Use T elephone Completely Able Care for Appearance Completely Able Use T oilet Completely Able Bath / Shower Completely Able Dress Self Completely Able Feed Self Completely Able Walk Need Assistance Get In / Out Bed Need Assistance Housework Need Assistance Prepare Meals Completely [place] Completely Able Shop for Self Need Assistance Robin, MANGANELLI [name] ([id number]) [phone] [id number] Page 2 of 5 Electronic Signature(s) Signed: 08/20/2022 4:57:00 PM By: Rosalio Loud MSN RN CNS  WTA Previous Signature: 08/20/2022 2:02:47 PM Version By: Rosalio Loud MSN RN CNS WTA Entered By: Rosalio Loud on 08/20/2022 14:26:55 -------------------------------------------------------------------------------- Education Screening Details Patient Name: Date of Service: [name]. 08/20/2022 1:00 PM Medical Record Number: [id number] Patient Account Number: [id number] Date of Birth/Sex: Treating RN: [birth year] (61 y.o. Female) Rosalio Loud Primary Care Devri Kreher: [place] Lank Other Clinician: Referring Stormey Wilborn: Treating Alquan Morrish/Extender: Jeannie Done in Treatment: 0 Learning Preferences/Education Level/Primary Language Learning Preference: Demonstration Highest Education Level: High School Preferred Language: English Cognitive Barrier Language Barrier: No Translator Needed: No Memory Deficit: No Emotional Barrier: No Cultural/Religious Beliefs Affecting Medical Care: No Physical Barrier Impaired Vision: No Impaired Hearing: Yes Hearing Aid Decreased Hand dexterity: No Knowledge/Comprehension Knowledge Level: High Comprehension Level: High Ability to understand written instructions: High Ability to understand verbal instructions: High Motivation Anxiety Level: Calm Cooperation: Cooperative Education Importance: Acknowledges Need Interest in Health Problems: Asks Questions Perception: Coherent Willingness to Engage in Self-Management High Activities: Readiness to Engage in Self-Management High Activities: Electronic Signature(s) Signed: 08/20/2022 4:57:00 PM By: Rosalio Loud MSN RN CNS WTA Previous Signature: 08/20/2022 2:02:52 PM Version By: Rosalio Loud MSN RN CNS WTA Entered By: Rosalio Loud on 08/20/2022 14:26:59 Ticas, Keriann [name] ([id number]) [id number] Page 3 of 5 -------------------------------------------------------------------------------- Fall Risk Assessment Details Patient Name: Date of  Service: [name], Robin 08/20/2022 1:00 PM Medical Record Number: [id number] Patient Account Number: [id number] Date of Birth/Sex: Treating RN: [birth year] (61 y.o. Female) Rosalio Loud Primary Care Caellum Mancil: [place] Lank Other Clinician: Referring Terion Hedman: Treating Llesenia Fogal/Extender: Tobias Alexander Weeks in Treatment: 0 Fall Risk Assessment Items Have you had 2 or more falls in the last 12 monthso 0 Yes Have you had any fall that resulted in injury in the last 12 monthso 0 No FALLS RISK SCREEN History of falling - immediate or within 3 months 0 No Secondary diagnosis (Do you have 2 or more medical diagnoseso) 0 No Ambulatory aid None/bed rest/wheelchair/nurse 0 No  Crutches/cane/walker 15 Yes Furniture 0 No Intravenous therapy Access/Saline/Heparin Lock 0 No Gait/Transferring Normal/ bed rest/ wheelchair 0 No Weak (short steps with or without shuffle, stooped but able to lift head while walking, may seek 10 Yes support from furniture) Impaired (short steps with shuffle, may have difficulty arising from chair, head down, impaired 20 Yes balance) Mental Status Oriented to own ability 0 No Electronic Signature(s) Signed: 08/20/2022 2:02:55 PM By: Rosalio Loud MSN RN CNS WTA Entered By: Rosalio Loud on 08/20/2022 14:02:55 -------------------------------------------------------------------------------- Foot Assessment Details Patient Name: Date of Service: [name]. 08/20/2022 1:00 PM Medical Record Number: [id number] Patient Account Number: [id number] Date of Birth/Sex: Treating RN: 01/09/1961 (61 y.o. Female) Rosalio Loud Primary Care Lavanya Roa: [place] Lank Other Clinician: Referring Shayden Gingrich: Treating Kahron Kauth/Extender: Tobias Alexander Weeks in Treatment: 0 Foot Assessment Items Site Locations [place], [name] ([id number]) [phone] [id number] Page 4 of 5 + = Sensation present, - = Sensation absent, C = Callus, U =  Ulcer R = Redness, W = Warmth, M = Maceration, PU = Pre-ulcerative lesion F = Fissure, S = Swelling, [name] = Dryness Assessment Right: Left: Other Deformity: No No Prior Foot Ulcer: No No Prior Amputation: No No Charcot Joint: No No Ambulatory Status: Ambulatory With Help Assistance Device: Walker Gait: Administrator, arts) Signed: 08/20/2022 2:03:05 PM By: Rosalio Loud MSN RN CNS WTA Entered By: Rosalio Loud on 08/20/2022 14:03:05 -------------------------------------------------------------------------------- Nutrition Risk Screening Details Patient Name: Date of Service: [name], Robin 08/20/2022 1:00 PM Medical Record Number: [id number] Patient Account Number: [id number] Date of Birth/Sex: Treating RN: 1961/01/04 (61 y.o. Female) Rosalio Loud Primary Care Clella Mckeel: [place] Lank Other Clinician: Referring Shantoria Ellwood: Treating Myliyah Rebuck/Extender: Tobias Alexander Weeks in Treatment: 0 Height (in): 65 Weight (lbs): 170 Body Mass Index (BMI): 28.3 Nutrition Risk Screening Items Score Screening NUTRITION RISK SCREEN: I have an illness or condition that made me change the kind and/or amount of food I eat 0 No I eat fewer than two meals per day 0 No I eat few fruits and vegetables, or milk products 0 No I have three or more drinks of beer, liquor or wine almost every day 0 No I have tooth or mouth problems that make it hard for me to eat 0 No Sarabia, Cinthia [name] ([id number]) [id number] Page 5 of 5 I don't always have enough money to buy the food I need 0 No I eat alone most of the time 0 No I take three or more different prescribed or over-the-counter drugs a day 0 No Without wanting to, I have lost or gained 10 pounds in the last six months 0 No I am not always physically able to shop, cook and/or feed myself 0 No Nutrition Protocols Good Risk Protocol 0 No interventions needed Moderate Risk Protocol High Risk Proctocol Risk  Level: Good Risk Score: 0 Electronic Signature(s) Signed: 08/20/2022 2:02:59 PM By: Rosalio Loud MSN RN CNS WTA Entered By: Rosalio Loud on 08/20/2022 14:02:59

## 2022-08-20 NOTE — Progress Notes (Addendum)
[name], [name] ([id number]) [id number] Page 1 of 7 Visit Report for 08/20/2022 Chief Complaint Document Details Patient Name: Date of Service: SABRIN, DUNLEVY 08/20/2022 1:00 PM Medical Record Number: [id number] Patient Account Number: [id number] Date of Birth/Sex: Treating RN: 09/20/1961 (61 y.o. Female) Rosalio Loud Primary Care Provider: D[place] Lank Other Clinician: Referring Provider: Treating Provider/Extender: Tobias Alexander Weeks in Treatment: 0 Information Obtained from: Patient Chief Complaint 08/20/2022; lymphedema Electronic Signature(s) Signed: 08/20/2022 4:08:48 PM By: Kalman Shan DO Entered By: Kalman Shan on 08/20/2022 14:27:28 -------------------------------------------------------------------------------- HPI Details Patient Name: Date of Service: Robin Nordmann D. 08/20/2022 1:00 PM Medical Record Number: [id number] Patient Account Number: [id number] Date of Birth/Sex: Treating RN: March 11, 1961 (61 y.o. Female) Rosalio Loud Primary Care Provider: Denton Lank Other Clinician: Referring Provider: Treating Provider/Extender: Tobias Alexander Weeks in Treatment: 0 History of Present Illness HPI Description: 08/20/2022 [name] is a 61 year old female with a past medical history of end-stage renal disease on hemodialysis, type 2 diabetes, and lymphedema that presents the clinic for concern of a wound to her right lower extremity. There is a scab there but once removed there is epithelization to this area. She owns compression stockings but does not wear them. She denies weeping to her legs. Electronic Signature(s) Signed: 08/20/2022 4:08:48 PM By: Kalman Shan DO Entered By: Kalman Shan on 08/20/2022 14:29:43 Asche, Ariah D ([id number]) [id number] Page 2 of 7 -------------------------------------------------------------------------------- Physical Exam  Details Patient Name: Date of Service: [name], [name] 08/20/2022 1:00 PM Medical Record Number: [id number] Patient Account Number: [id number] Date of Birth/Sex: Treating RN: [birth year] (61 y.o. Female) Rosalio Loud Primary Care Provider: D[place] Lank Other Clinician: Referring Provider: Treating Provider/Extender: Tobias Alexander Weeks in Treatment: 0 Constitutional . Cardiovascular . Psychiatric . Notes Lower extremities with significant lymphedema changes throughout. Nonpitting edema. Small scab to the anterior aspect but once removed epithelization underneath. No weeping noted. No signs of infection including increased warmth, erythema or purulent drainage. Electronic Signature(s) Signed: 08/20/2022 4:08:48 PM By: Kalman Shan DO Entered By: Kalman Shan on 08/20/2022 14:30:28 -------------------------------------------------------------------------------- Physician Orders Details Patient Name: Date of Service: Robin Nordmann D. 08/20/2022 1:00 PM Medical Record Number: [id number] Patient Account Number: [id number] Date of Birth/Sex: Treating RN: August 09, 1961 (61 y.o. Female) Rosalio Loud Primary Care Provider: Denton Lank Other Clinician: Referring Provider: Treating Provider/Extender: Tobias Alexander Weeks in Treatment: 0 Verbal / Phone Orders: No Diagnosis Coding ICD-10 Coding Code Description N18.6 End stage renal disease I50.33 Acute on chronic diastolic (congestive) heart failure J44.9 Chronic obstructive pulmonary disease, unspecified E11.29 Type 2 diabetes mellitus with other diabetic kidney complication Discharge From [facility] Discharge from [facility] Complete - Call if any further questions CHAITRA, MAST ([id number]) [id number] Page 3 of 7 Follow-up Appointments Other: - call if needed Edema Control - Lymphedema / Segmental Compressive Device / Other Bilateral Lower  Extremities Tubigrip double layer applied - Size E Elevate legs to the level of the heart and pump ankles as often as possible Non-Wound Condition Apply appropriate compression. Electronic Signature(s) Signed: 08/20/2022 4:08:48 PM By: Kalman Shan DO Signed: 08/20/2022 4:57:00 PM By: Rosalio Loud MSN RN CNS WTA Entered By: Rosalio Loud on 08/20/2022 14:30:36 -------------------------------------------------------------------------------- Problem List Details Patient Name: Date of Service: Robin Nordmann D. 08/20/2022 1:00 PM Medical Record Number: [id number] Patient Account Number: [id number] Date of Birth/Sex: Treating RN: 13-Oct-1960 (61 y.o. Female) Rosalio Loud Primary Care Provider: D[place] Lank Other Clinician: Referring Provider: Treating Provider/Extender: Tobias Alexander Weeks in  Treatment: 0 Active Problems ICD-10 Encounter Code Description Active Date MDM Diagnosis N18.6 End stage renal disease 08/20/2022 No Yes I50.33 Acute on chronic diastolic (congestive) heart failure 08/20/2022 No Yes J44.9 Chronic obstructive pulmonary disease, unspecified 08/20/2022 No Yes E11.29 Type 2 diabetes mellitus with other diabetic kidney complication 42/68/3419 No Yes Inactive Problems Resolved Problems Electronic Signature(s) Signed: 08/20/2022 4:08:48 PM By: Kalman Shan DO Signed: 08/20/2022 4:57:00 PM By: Rosalio Loud MSN RN CNS WTA Previous Signature: 08/20/2022 1:36:01 PM Version By: Kalman Shan DO Entered By: Rosalio Loud on 08/20/2022 14:31:10 Prevo, Calaya D ([id number]) [id number] Page 4 of 7 -------------------------------------------------------------------------------- Progress Note Details Patient Name: Date of Service: DELESHA, POHLMAN 08/20/2022 1:00 PM Medical Record Number: [id number] Patient Account Number: [id number] Date of Birth/Sex: Treating RN: 11/17/1960 (61 y.o. Female) Rosalio Loud Primary Care Provider:  Denton Lank Other Clinician: Referring Provider: Treating Provider/Extender: Tobias Alexander Weeks in Treatment: 0 Subjective Chief Complaint Information obtained from Patient 08/20/2022; lymphedema History of Present Illness (HPI) 08/20/2022 [name] is a 60 year old female with a past medical history of end-stage renal disease on hemodialysis, type 2 diabetes, and lymphedema that presents the clinic for concern of a wound to her right lower extremity. There is a scab there but once removed there is epithelization to this area. She owns compression stockings but does not wear them. She denies weeping to her legs. Patient History Allergies ACE Inhibitors (Severity: Severe, Reaction: Swelling) Social History Former smoker, Alcohol Use - Never, Drug Use - No History, Caffeine Use - Rarely. Medical History Respiratory Patient has history of Chronic Obstructive Pulmonary Disease (COPD) Cardiovascular Patient has history of Hypertension, Myocardial Infarction, Peripheral Venous Disease Endocrine Patient has history of Type II Diabetes Genitourinary Patient has history of End Stage Renal Disease Patient is treated with Controlled Diet. Review of Systems (ROS) Eyes Denies complaints or symptoms of Dry Eyes, Vision Changes, Glasses / Contacts. Genitourinary Complains or has symptoms of Kidney failure/ Dialysis. Integumentary (Skin) Complains or has symptoms of Wounds. Objective Constitutional Vitals Time Taken: 1:20 PM, Height: 65 in, Source: Stated, Weight: 170 lbs, Source: Stated, BMI: 28.3, Temperature: 97.9 F, Pulse: 65 bpm, Respiratory Rate: 16 breaths/[name], Blood Pressure: 163/55 mmHg. General Notes: Lower extremities with significant lymphedema changes throughout. Nonpitting edema. Small scab to the anterior aspect but once removed epithelization underneath. No weeping noted. No signs of infection including increased warmth, erythema or purulent  drainage. ASPIN, PALOMAREZ ([id number]) [id number] Page 5 of 7 Assessment Active Problems ICD-10 End stage renal disease Acute on chronic diastolic (congestive) heart failure Chronic obstructive pulmonary disease, unspecified Type 2 diabetes mellitus with other diabetic kidney complication Patient has no wounds. She has significant lymphedema. I recommended compression stockings daily. I recommended she elevate her legs when sitting. We gave her Tubigrip in office and she can put these on daily if easier than the compression stockings. She cannot afford juxta lite compression wraps as these may be easier to put on. She may follow-up as needed. Plan 1. Compression stockings daily 2. Follow-up as needed Electronic Signature(s) Signed: 08/20/2022 4:08:48 PM By: Kalman Shan DO Entered By: Kalman Shan on 08/20/2022 14:34:04 -------------------------------------------------------------------------------- ROS/PFSH Details Patient Name: Date of Service: Robin Nordmann D. 08/20/2022 1:00 PM Medical Record Number: [id number] Patient Account Number: [id number] Date of Birth/Sex: Treating RN: 1961-04-08 (61 y.o. Female) Rosalio Loud Primary Care Provider: Denton Lank Other Clinician: Referring Provider: Treating Provider/Extender: Tobias Alexander Weeks in Treatment: 0 Eyes Complaints and Symptoms: Negative for: Dry Eyes; Vision  Changes; Glasses / Contacts Genitourinary Complaints and Symptoms: Positive for: Kidney failure/ Dialysis Medical History: Positive for: End Stage Renal Disease Integumentary (Skin) Complaints and Symptoms: Positive for: Wounds Respiratory Medical History: Positive for: Chronic Obstructive Pulmonary Disease (COPD) Cardiovascular Medical HistoryNJERI, VICENTE ([id number]) [id number] Page 6 of 7 Positive for: Hypertension; Myocardial Infarction; Peripheral Venous  Disease Endocrine Medical History: Positive for: Type II Diabetes Treated with: Diet Immunizations Pneumococcal Vaccine: Received Pneumococcal Vaccination: Yes Received Pneumococcal Vaccination On or After 60th Birthday: Yes Implantable Devices None Family and Social History Former smoker; Alcohol Use: Never; Drug Use: No History; Caffeine Use: Rarely Electronic Signature(s) Signed: 08/20/2022 4:08:48 PM By: Kalman Shan DO Signed: 08/20/2022 4:57:00 PM By: Rosalio Loud MSN RN CNS WTA Entered By: Rosalio Loud on 08/20/2022 14:26:45 -------------------------------------------------------------------------------- SuperBill Details Patient Name: Date of Service: Robin Nordmann D. 08/20/2022 Medical Record Number: [id number] Patient Account Number: [id number] Date of Birth/Sex: Treating RN: January 02, 1961 (61 y.o. Female) Rosalio Loud Primary Care Provider: Denton Lank Other Clinician: Referring Provider: Treating Provider/Extender: Tobias Alexander Weeks in Treatment: 0 Diagnosis Coding ICD-10 Codes Code Description N18.6 End stage renal disease I50.33 Acute on chronic diastolic (congestive) heart failure J44.9 Chronic obstructive pulmonary disease, unspecified E11.29 Type 2 diabetes mellitus with other diabetic kidney complication Facility Procedures : CPT4 Code: [id number] Description: 71245 - WOUND CARE VISIT-LEV 5 EST PT Modifier: Quantity: 1 Physician Procedures : CPT4 Code Description Modifier [id number] 82505 - WC PHYS LEVEL 3 - EST PT ICD-10 Diagnosis Description N18.6 End stage renal disease I50.33 Acute on chronic diastolic (congestive) heart failure J44.9 Chronic obstructive pulmonary disease, unspecified  E11.29 Type 2 diabetes mellitus with other diabetic kidney complication Brashear, Mackie D ([id number]) [id number] Quantity: 1 Page 7 of 7 Electronic Signature(s) Signed: 08/20/2022 4:08:48 PM By: Kalman Shan DO Entered By:  Kalman Shan on 08/20/2022 14:34:20

## 2022-08-20 NOTE — Progress Notes (Signed)
[name] ([id number]) [id number] Page 1 of 8 Visit Report for 08/20/2022 Allergy List Details Patient Name: Date of Service: [name], [name] 08/20/2022 1:00 PM Medical Record Number: [id number] Patient Account Number: [id number] Date of Birth/Sex: Treating RN: May 09, 1961 (61 y.o. Female) Rosalio Loud Primary Care Laekyn Rayos: Denton Lank Other Clinician: Referring Christepher Melchior: Treating Davius Goudeau/Extender: Tobias Alexander Weeks in Treatment: 0 Allergies Active Allergies ACE Inhibitors Reaction: Swelling Severity: Severe Allergy Notes Electronic Signature(s) Signed: 08/20/2022 4:57:00 PM By: Rosalio Loud MSN RN CNS WTA Previous Signature: 08/20/2022 2:01:36 PM Version By: Rosalio Loud MSN RN CNS WTA Entered By: Rosalio Loud on 08/20/2022 14:26:39 -------------------------------------------------------------------------------- Arrival Information Details Patient Name: Date of Service: Robin Nordmann [name]. 08/20/2022 1:00 PM Medical Record Number: [id number] Patient Account Number: [id number] Date of Birth/Sex: Treating RN: 10-16-1960 (61 y.o. Female) Rosalio Loud Primary Care Codie Hainer: Denton Lank Other Clinician: Referring Ragina Fenter: Treating Dashae Wilcher/Extender: Jeannie Done in Treatment: 0 Visit Information Patient Arrived: [name] Arrival Time: 13:19 Accompanied By: self Transfer Assistance: None Patient Identification Verified: Yes Secondary Verification Process Completed: Yes Patient Requires Transmission-Based Precautions: No Patient Has Alerts: Yes Patient Alerts: Diabetes Type 2 Dialysis [name] ([id number]) [id number] Page 2 of 8 Electronic Signature(s) Signed: 08/20/2022 2:01:12 PM By: Rosalio Loud MSN RN CNS WTA Entered By: Rosalio Loud on 08/20/2022 14:01:12 -------------------------------------------------------------------------------- Clinic Level of Care Assessment  Details Patient Name: Date of Service: [name] 08/20/2022 1:00 PM Medical Record Number: [id number] Patient Account Number: [id number] Date of Birth/Sex: Treating RN: [birth year] (61 y.o. Female) Rosalio Loud Primary Care Conner Neiss: Denton Lank Other Clinician: Referring Zaccary Creech: Treating Vito Beg/Extender: Tobias Alexander Weeks in Treatment: 0 Clinic Level of Care Assessment Items TOOL 2 Quantity Score X- 1 0 Use when only an EandM is performed on the INITIAL visit ASSESSMENTS - Nursing Assessment / Reassessment X- 1 20 General Physical Exam (combine w/ comprehensive assessment (listed just below) when performed on new pt. evals) X- 1 25 Comprehensive Assessment (HX, ROS, Risk Assessments, Wounds Hx, etc.) ASSESSMENTS - Wound and Skin A ssessment / Reassessment '[]'$  - 0 Simple Wound Assessment / Reassessment - one wound X- 2 5 Complex Wound Assessment / Reassessment - multiple wounds '[]'$  - 0 Dermatologic / Skin Assessment (not related to wound area) ASSESSMENTS - Ostomy and/or Continence Assessment and Care '[]'$  - 0 Incontinence Assessment and Management '[]'$  - 0 Ostomy Care Assessment and Management (repouching, etc.) PROCESS - Coordination of Care '[]'$  - 0 Simple Patient / Family Education for ongoing care X- 1 20 Complex (extensive) Patient / Family Education for ongoing care X- 1 10 Staff obtains Programmer, systems, Records, T Results / Process Orders est '[]'$  - 0 Staff telephones HHA, Nursing Homes / Clarify orders / etc '[]'$  - 0 Routine Transfer to another Facility (non-emergent condition) '[]'$  - 0 Routine Hospital Admission (non-emergent condition) X- 1 15 New Admissions / Biomedical engineer / Ordering NPWT Apligraf, etc. , '[]'$  - 0 Emergency Hospital Admission (emergent condition) '[]'$  - 0 Simple Discharge Coordination X- 1 15 Complex (extensive) Discharge Coordination PROCESS - Special Needs '[]'$  - 0 Pediatric / Minor Patient Management '[]'$  - 0 Isolation  Patient Management '[]'$  - 0 Hearing / Language / Visual special needs '[]'$  - 0 Assessment of Community assistance (transportation, [name]/C planning, etc.) '[]'$  - 0 Additional assistance / Altered mentation Kluever, Dominigue [name] ([id number]) [id number] Page 3 of 8 '[]'$  - 0 Support Surface(s) Assessment (bed, cushion, seat, etc.) INTERVENTIONS - Wound Cleansing / Measurement X- 1 5 Wound Imaging (  photographs - any number of wounds) '[]'$  - 0 Wound Tracing (instead of photographs) '[]'$  - 0 Simple Wound Measurement - one wound X- 2 5 Complex Wound Measurement - multiple wounds '[]'$  - 0 Simple Wound Cleansing - one wound X- 2 5 Complex Wound Cleansing - multiple wounds INTERVENTIONS - Wound Dressings '[]'$  - 0 Small Wound Dressing one or multiple wounds X- 1 15 Medium Wound Dressing one or multiple wounds '[]'$  - 0 Large Wound Dressing one or multiple wounds '[]'$  - 0 Application of Medications - injection INTERVENTIONS - Miscellaneous '[]'$  - 0 External ear exam '[]'$  - 0 Specimen Collection (cultures, biopsies, blood, body fluids, etc.) '[]'$  - 0 Specimen(s) / Culture(s) sent or taken to Lab for analysis '[]'$  - 0 Patient Transfer (multiple staff / Civil Service fast streamer / Similar devices) '[]'$  - 0 Simple Staple / Suture removal (25 or less) '[]'$  - 0 Complex Staple / Suture removal (26 or more) '[]'$  - 0 Hypo / Hyperglycemic Management (close monitor of Blood Glucose) X- 1 15 Ankle / Brachial Index (ABI) - do not check if billed separately Has the patient been seen at the hospital within the last three years: Yes Total Score: 170 Level Of Care: New/Established - Level 5 Electronic Signature(s) Signed: 08/20/2022 4:57:00 PM By: Rosalio Loud MSN RN CNS WTA Entered By: Rosalio Loud on 08/20/2022 14:30:41 -------------------------------------------------------------------------------- Encounter Discharge Information Details Patient Name: Date of Service: Robin Nordmann [name]. 08/20/2022 1:00 PM Medical Record  Number: [id number] Patient Account Number: [id number] Date of Birth/Sex: Treating RN: [birth year] (61 y.o. Female) Rosalio Loud Primary Care Jilene Spohr: Denton Lank Other Clinician: Referring Audi Conover: Treating Carolle Ishii/Extender: Tobias Alexander Weeks in Treatment: 0 Encounter Discharge Information Items Discharge Condition: Stable Ambulatory Status: Walker Discharge Destination: Home Transportation: Private Auto Accompanied By: self Schedule Follow-up Appointment: Ulyess Mort ([id number]) [id number] Page 4 of 8 Clinical Summary of Care: Electronic Signature(s) Signed: 08/20/2022 4:57:00 PM By: Rosalio Loud MSN RN CNS WTA Entered By: Rosalio Loud on 08/20/2022 14:28:33 -------------------------------------------------------------------------------- Lower Extremity Assessment Details Patient Name: Date of Service: Robin Nordmann [name]. 08/20/2022 1:00 PM Medical Record Number: [id number] Patient Account Number: [id number] Date of Birth/Sex: Treating RN: 06/06/1961 (61 y.o. Female) Rosalio Loud Primary Care Saia Derossett: Denton Lank Other Clinician: Referring Tierria [name]: Treating Yiannis Tulloch/Extender: Tobias Alexander Weeks in Treatment: 0 Edema Assessment Assessed: [Left: No] [Right: No] [Left: Edema] [Right: :] Calf Left: Right: Point of Measurement: 35 cm From Medial Instep 39 cm 33.5 cm Ankle Left: Right: Point of Measurement: 10 cm From Medial Instep 26.6 cm 26 cm Vascular Assessment Pulses: Dorsalis Pedis Palpable: [Left:Yes] [Right:Yes] Blood Pressure: Brachial: [Left:170] [Right:160] Ankle: [Left:Posterior Tibial: 300 1.76] [Right:Posterior Tibial: 260 1.53] Electronic Signature(s) Signed: 08/20/2022 4:57:00 PM By: Rosalio Loud MSN RN CNS WTA Previous Signature: 08/20/2022 2:01:32 PM Version By: Rosalio Loud MSN RN CNS WTA Entered By: Rosalio Loud on 08/20/2022 14:27:49 Multi Wound Chart  Details -------------------------------------------------------------------------------- Robin Nordmann [name] ([id number]) [id number] Page 5 of 8 Patient Name: Date of Service: [name], [name] 08/20/2022 1:00 PM Medical Record Number: [id number] Patient Account Number: [id number] Date of Birth/Sex: Treating RN: Sep 27, 1960 (61 y.o. Female) Rosalio Loud Primary Care Kwame Ryland: Denton Lank Other Clinician: Referring Dio Giller: Treating Lyrical Sowle/Extender: Tobias Alexander Weeks in Treatment: 0 Vital Signs Height(in): 65 Pulse(bpm): 65 Weight(lbs): 170 Blood Pressure(mmHg): 163/55 Body Mass Index(BMI): 28.3 Temperature(F): 97.9 Respiratory Rate(breaths/min): 16 Wound Assessments Treatment Notes Wound #1 (Toe Great) Wound Laterality: Left, Posterior Cleanser Peri-Wound Care Topical Primary Dressing Secondary Dressing Secured With Compression Wrap  Compression Stockings Add-Ons Wound #2 (Lower Leg) Wound Laterality: Right, Anterior Cleanser Peri-Wound Care Topical Primary Dressing Secondary Dressing Secured With Compression Wrap Compression Stockings Add-Ons Electronic Signature(s) Signed: 08/20/2022 4:57:00 PM By: Rosalio Loud MSN RN CNS WTA Entered By: Rosalio Loud on 08/20/2022 14:31:22 -------------------------------------------------------------------------------- Multi-Disciplinary Care Plan Details Patient Name: Date of Service: Robin Nordmann [name]. 08/20/2022 1:00 PM Medical Record Number: [id number] Patient Account Number: [id number] MARTIE, [name] ([id number]) [id number] Page 6 of 8 Date of Birth/Sex: Treating RN: June 18, 1961 (61 y.o. Female) Rosalio Loud Primary Care Matteus Mcnelly: Other Clinician: Denton Lank Referring Lindsea Olivar: Treating Lorry Furber/Extender: Tobias Alexander Weeks in Treatment: 0 Active Inactive Electronic Signature(s) Signed: 08/20/2022 2:46:33 PM By: Rosalio Loud MSN RN CNS  WTA Previous Signature: 08/20/2022 2:04:41 PM Version By: Rosalio Loud MSN RN CNS WTA Entered By: Rosalio Loud on 08/20/2022 14:46:33 -------------------------------------------------------------------------------- Pain Assessment Details Patient Name: Date of Service: Robin Nordmann [name]. 08/20/2022 1:00 PM Medical Record Number: [id number] Patient Account Number: [id number] Date of Birth/Sex: Treating RN: 28-Jul-1961 (61 y.o. Female) Rosalio Loud Primary Care Ladarion Munyon: Denton Lank Other Clinician: Referring Shareese Macha: Treating Jakel Alphin/Extender: Tobias Alexander Weeks in Treatment: 0 Active Problems Location of Pain Severity and Description of Pain Patient Has Paino No Site Locations Pain Management and Medication Current Pain Management: Medication: Yes Electronic Signature(s) Signed: 08/20/2022 2:01:17 PM By: Rosalio Loud MSN RN CNS WTA Entered By: Rosalio Loud on 08/20/2022 14:01:17 [place], Ballard [name] ([id number]) [id number] Page 7 of 8 -------------------------------------------------------------------------------- Patient/Caregiver Education Details Patient Name: Date of Service: [name], [name] 11/29/2023andnbsp1:00 PM Medical Record Number: [id number] Patient Account Number: [id number] Date of Birth/Gender: Treating RN: [birth year] (61 y.o. Female) Rosalio Loud Primary Care Physician: Denton Lank Other Clinician: Referring Physician: Treating Physician/Extender: Jeannie Done in Treatment: 0 Education Assessment Education Provided To: Patient Education Topics Provided Welcome T [facility]: o Handouts: Welcome T [facility] [place] o Methods: Explain/Verbal Responses: State content correctly Wound/Skin Impairment: Electronic Signature(s) Signed: 08/20/2022 4:57:00 PM By: Rosalio Loud MSN RN CNS WTA Entered By: Rosalio Loud on 08/20/2022  14:31:05 -------------------------------------------------------------------------------- Vitals Details Patient Name: Date of Service: Robin Nordmann [name]. 08/20/2022 1:00 PM Medical Record Number: [id number] Patient Account Number: [id number] Date of Birth/Sex: Treating RN: [birth year] (61 y.o. Female) Rosalio Loud Primary Care Quoc Tome: Denton Lank Other Clinician: Referring Clatie Kessen: Treating Rodina Pinales/Extender: Tobias Alexander Weeks in Treatment: 0 Vital Signs Time Taken: 13:20 Temperature (F): 97.9 Height (in): 65 Pulse (bpm): 65 Source: Stated Respiratory Rate (breaths/min): 16 Weight (lbs): 170 Blood Pressure (mmHg): 163/55 Source: Stated Reference Range: 80 - 120 mg / dl Body Mass Index (BMI): 28.3 Electronic Signature(s) Signed: 08/20/2022 2:01:21 PM By: Rosalio Loud MSN RN CNS Laren Everts, Vaughan Basta [name] ([id number]) [id number] Page 8 of 8 Entered By: Rosalio Loud on 08/20/2022 14:01:21

## 2022-08-24 ENCOUNTER — Other Ambulatory Visit
Admission: RE | Admit: 2022-08-24 | Discharge: 2022-08-24 | Disposition: A | Discharge status: Home / Self Care | Payer: Medicare Other | Source: Ambulatory Visit | Attending: Gastroenterology | Admitting: Gastroenterology

## 2022-08-24 DIAGNOSIS — R152 Fecal urgency: Secondary | ICD-10-CM | POA: Insufficient documentation

## 2022-08-24 DIAGNOSIS — R159 Full incontinence of feces: Secondary | ICD-10-CM | POA: Diagnosis present

## 2022-08-24 DIAGNOSIS — Z992 Dependence on renal dialysis: Secondary | ICD-10-CM | POA: Insufficient documentation

## 2022-08-24 DIAGNOSIS — A0471 Enterocolitis due to Clostridium difficile, recurrent: Secondary | ICD-10-CM | POA: Insufficient documentation

## 2022-08-24 DIAGNOSIS — K529 Noninfective gastroenteritis and colitis, unspecified: Secondary | ICD-10-CM | POA: Insufficient documentation

## 2022-08-24 DIAGNOSIS — N186 End stage renal disease: Secondary | ICD-10-CM | POA: Insufficient documentation

## 2022-08-24 DIAGNOSIS — K8681 Exocrine pancreatic insufficiency: Secondary | ICD-10-CM | POA: Diagnosis present

## 2022-08-24 LAB — GASTROINTESTINAL PANEL BY PCR, STOOL (REPLACES STOOL CULTURE)

## 2022-08-24 LAB — CLOSTRIDIUM DIFFICILE BY PCR, REFLEXED: Toxigenic C. Difficile by PCR: POSITIVE — AB

## 2022-08-24 LAB — C DIFFICILE QUICK SCREEN W PCR REFLEX
C Diff antigen: POSITIVE — AB
C Diff toxin: NEGATIVE

## 2022-09-03 ENCOUNTER — Other Ambulatory Visit: Payer: Self-pay

## 2022-09-03 ENCOUNTER — Encounter: Payer: Self-pay | Admitting: Family Medicine

## 2022-09-03 ENCOUNTER — Inpatient Hospital Stay
Admission: EM | Admit: 2022-09-03 | Discharge: 2022-09-15 | DRG: 073 | Disposition: A | Discharge status: Hospice | Payer: Medicare Other | Attending: Internal Medicine | Admitting: Internal Medicine

## 2022-09-03 ENCOUNTER — Emergency Department: Payer: Medicare Other

## 2022-09-03 DIAGNOSIS — I132 Hypertensive heart and chronic kidney disease with heart failure and with stage 5 chronic kidney disease, or end stage renal disease: Secondary | ICD-10-CM | POA: Diagnosis present

## 2022-09-03 DIAGNOSIS — K219 Gastro-esophageal reflux disease without esophagitis: Secondary | ICD-10-CM | POA: Diagnosis present

## 2022-09-03 DIAGNOSIS — K2970 Gastritis, unspecified, without bleeding: Secondary | ICD-10-CM | POA: Diagnosis not present

## 2022-09-03 DIAGNOSIS — E871 Hypo-osmolality and hyponatremia: Secondary | ICD-10-CM | POA: Diagnosis present

## 2022-09-03 DIAGNOSIS — H9190 Unspecified hearing loss, unspecified ear: Secondary | ICD-10-CM | POA: Diagnosis present

## 2022-09-03 DIAGNOSIS — R112 Nausea with vomiting, unspecified: Secondary | ICD-10-CM | POA: Diagnosis present

## 2022-09-03 DIAGNOSIS — K861 Other chronic pancreatitis: Secondary | ICD-10-CM | POA: Diagnosis present

## 2022-09-03 DIAGNOSIS — E876 Hypokalemia: Secondary | ICD-10-CM | POA: Diagnosis present

## 2022-09-03 DIAGNOSIS — Z8719 Personal history of other diseases of the digestive system: Secondary | ICD-10-CM

## 2022-09-03 DIAGNOSIS — I252 Old myocardial infarction: Secondary | ICD-10-CM

## 2022-09-03 DIAGNOSIS — A498 Other bacterial infections of unspecified site: Secondary | ICD-10-CM | POA: Diagnosis present

## 2022-09-03 DIAGNOSIS — Z7982 Long term (current) use of aspirin: Secondary | ICD-10-CM

## 2022-09-03 DIAGNOSIS — F4323 Adjustment disorder with mixed anxiety and depressed mood: Secondary | ICD-10-CM | POA: Diagnosis present

## 2022-09-03 DIAGNOSIS — Z961 Presence of intraocular lens: Secondary | ICD-10-CM | POA: Diagnosis present

## 2022-09-03 DIAGNOSIS — E1043 Type 1 diabetes mellitus with diabetic autonomic (poly)neuropathy: Secondary | ICD-10-CM | POA: Diagnosis not present

## 2022-09-03 DIAGNOSIS — R188 Other ascites: Secondary | ICD-10-CM | POA: Diagnosis present

## 2022-09-03 DIAGNOSIS — E1051 Type 1 diabetes mellitus with diabetic peripheral angiopathy without gangrene: Secondary | ICD-10-CM | POA: Diagnosis present

## 2022-09-03 DIAGNOSIS — N186 End stage renal disease: Secondary | ICD-10-CM | POA: Diagnosis not present

## 2022-09-03 DIAGNOSIS — R1310 Dysphagia, unspecified: Secondary | ICD-10-CM | POA: Diagnosis present

## 2022-09-03 DIAGNOSIS — K529 Noninfective gastroenteritis and colitis, unspecified: Secondary | ICD-10-CM | POA: Diagnosis present

## 2022-09-03 DIAGNOSIS — Z992 Dependence on renal dialysis: Secondary | ICD-10-CM

## 2022-09-03 DIAGNOSIS — Z91048 Other nonmedicinal substance allergy status: Secondary | ICD-10-CM

## 2022-09-03 DIAGNOSIS — Z809 Family history of malignant neoplasm, unspecified: Secondary | ICD-10-CM

## 2022-09-03 DIAGNOSIS — R1084 Generalized abdominal pain: Secondary | ICD-10-CM

## 2022-09-03 DIAGNOSIS — Z79899 Other long term (current) drug therapy: Secondary | ICD-10-CM

## 2022-09-03 DIAGNOSIS — Z794 Long term (current) use of insulin: Secondary | ICD-10-CM

## 2022-09-03 DIAGNOSIS — K3184 Gastroparesis: Secondary | ICD-10-CM | POA: Diagnosis present

## 2022-09-03 DIAGNOSIS — E1022 Type 1 diabetes mellitus with diabetic chronic kidney disease: Secondary | ICD-10-CM | POA: Diagnosis present

## 2022-09-03 DIAGNOSIS — G629 Polyneuropathy, unspecified: Secondary | ICD-10-CM

## 2022-09-03 DIAGNOSIS — Z515 Encounter for palliative care: Secondary | ICD-10-CM

## 2022-09-03 DIAGNOSIS — E10649 Type 1 diabetes mellitus with hypoglycemia without coma: Secondary | ICD-10-CM | POA: Diagnosis not present

## 2022-09-03 DIAGNOSIS — K2900 Acute gastritis without bleeding: Secondary | ICD-10-CM | POA: Diagnosis present

## 2022-09-03 DIAGNOSIS — E785 Hyperlipidemia, unspecified: Secondary | ICD-10-CM | POA: Diagnosis present

## 2022-09-03 DIAGNOSIS — E1143 Type 2 diabetes mellitus with diabetic autonomic (poly)neuropathy: Secondary | ICD-10-CM | POA: Diagnosis present

## 2022-09-03 DIAGNOSIS — Z841 Family history of disorders of kidney and ureter: Secondary | ICD-10-CM

## 2022-09-03 DIAGNOSIS — Z9841 Cataract extraction status, right eye: Secondary | ICD-10-CM

## 2022-09-03 DIAGNOSIS — Z888 Allergy status to other drugs, medicaments and biological substances status: Secondary | ICD-10-CM

## 2022-09-03 DIAGNOSIS — Z833 Family history of diabetes mellitus: Secondary | ICD-10-CM

## 2022-09-03 DIAGNOSIS — R509 Fever, unspecified: Secondary | ICD-10-CM | POA: Diagnosis present

## 2022-09-03 DIAGNOSIS — I2510 Atherosclerotic heart disease of native coronary artery without angina pectoris: Secondary | ICD-10-CM | POA: Diagnosis present

## 2022-09-03 DIAGNOSIS — Z89422 Acquired absence of other left toe(s): Secondary | ICD-10-CM

## 2022-09-03 DIAGNOSIS — I10 Essential (primary) hypertension: Secondary | ICD-10-CM | POA: Diagnosis present

## 2022-09-03 DIAGNOSIS — F419 Anxiety disorder, unspecified: Secondary | ICD-10-CM | POA: Diagnosis present

## 2022-09-03 DIAGNOSIS — D631 Anemia in chronic kidney disease: Secondary | ICD-10-CM | POA: Diagnosis present

## 2022-09-03 DIAGNOSIS — N2581 Secondary hyperparathyroidism of renal origin: Secondary | ICD-10-CM | POA: Diagnosis present

## 2022-09-03 DIAGNOSIS — K8681 Exocrine pancreatic insufficiency: Secondary | ICD-10-CM | POA: Diagnosis present

## 2022-09-03 DIAGNOSIS — J4489 Other specified chronic obstructive pulmonary disease: Secondary | ICD-10-CM | POA: Diagnosis present

## 2022-09-03 DIAGNOSIS — I5032 Chronic diastolic (congestive) heart failure: Secondary | ICD-10-CM | POA: Diagnosis present

## 2022-09-03 DIAGNOSIS — K580 Irritable bowel syndrome with diarrhea: Secondary | ICD-10-CM | POA: Diagnosis present

## 2022-09-03 DIAGNOSIS — Z85528 Personal history of other malignant neoplasm of kidney: Secondary | ICD-10-CM

## 2022-09-03 DIAGNOSIS — Z9842 Cataract extraction status, left eye: Secondary | ICD-10-CM

## 2022-09-03 DIAGNOSIS — Z66 Do not resuscitate: Secondary | ICD-10-CM | POA: Diagnosis present

## 2022-09-03 DIAGNOSIS — R32 Unspecified urinary incontinence: Secondary | ICD-10-CM | POA: Diagnosis present

## 2022-09-03 HISTORY — DX: Nausea with vomiting, unspecified: R11.2

## 2022-09-03 LAB — COMPREHENSIVE METABOLIC PANEL
ALT: 9 U/L (ref 0–44)
AST: 25 U/L (ref 15–41)
Albumin: 2.7 g/dL — ABNORMAL LOW (ref 3.5–5.0)
Alkaline Phosphatase: 76 U/L (ref 38–126)
Anion gap: 7 (ref 5–15)
BUN: 7 mg/dL — ABNORMAL LOW (ref 8–23)
CO2: 32 mmol/L (ref 22–32)
Calcium: 7.8 mg/dL — ABNORMAL LOW (ref 8.9–10.3)
Chloride: 97 mmol/L — ABNORMAL LOW (ref 98–111)
Creatinine, Ser: 4.02 mg/dL — ABNORMAL HIGH (ref 0.44–1.00)
GFR, Estimated: 12 mL/min — ABNORMAL LOW (ref >60)
Glucose, Bld: 84 mg/dL (ref 70–99)
Potassium: 3.1 mmol/L — ABNORMAL LOW (ref 3.5–5.1)
Sodium: 136 mmol/L (ref 135–145)
Total Bilirubin: 0.6 mg/dL (ref 0.3–1.2)
Total Protein: 6.5 g/dL (ref 6.5–8.1)

## 2022-09-03 LAB — CBC
HCT: 28 % — ABNORMAL LOW (ref 36.0–46.0)
Hemoglobin: 8.9 g/dL — ABNORMAL LOW (ref 12.0–15.0)
MCH: 25.9 pg — ABNORMAL LOW (ref 26.0–34.0)
MCHC: 31.8 g/dL (ref 30.0–36.0)
MCV: 81.4 fL (ref 80.0–100.0)
Platelets: 110 10*3/uL — ABNORMAL LOW (ref 150–400)
RBC: 3.44 MIL/uL — ABNORMAL LOW (ref 3.87–5.11)
RDW: 20.7 % — ABNORMAL HIGH (ref 11.5–15.5)
WBC: 4.1 10*3/uL (ref 4.0–10.5)
nRBC: 0 % (ref 0.0–0.2)

## 2022-09-03 LAB — CBG MONITORING, ED
Glucose-Capillary: 46 mg/dL — ABNORMAL LOW (ref 70–99)
Glucose-Capillary: 50 mg/dL — ABNORMAL LOW (ref 70–99)
Glucose-Capillary: 61 mg/dL — ABNORMAL LOW (ref 70–99)
Glucose-Capillary: 101 mg/dL — ABNORMAL HIGH (ref 70–99)
Glucose-Capillary: 173 mg/dL — ABNORMAL HIGH (ref 70–99)

## 2022-09-03 LAB — LIPASE, BLOOD: Lipase: 26 U/L (ref 11–51)

## 2022-09-03 LAB — MAGNESIUM: Magnesium: 1.9 mg/dL (ref 1.7–2.4)

## 2022-09-03 MED ORDER — DEXTROSE 10 % IV SOLN: INTRAVENOUS | Status: DC

## 2022-09-03 MED ORDER — INSULIN ASPART 100 UNIT/ML IJ SOLN: 0.0000 [IU] | Freq: Every day | INTRAMUSCULAR | Status: DC

## 2022-09-03 MED ORDER — ALUM & MAG HYDROXIDE-SIMETH 200-200-20 MG/5ML PO SUSP: 15.0000 mL | Freq: Four times a day (QID) | ORAL | Status: DC | PRN

## 2022-09-03 MED ORDER — IOHEXOL 300 MG/ML  SOLN
100.0000 mL | Freq: Once | INTRAMUSCULAR | Status: AC | PRN
  Administered 2022-09-03: 100 mL via INTRAVENOUS

## 2022-09-03 MED ORDER — INSULIN ASPART 100 UNIT/ML IJ SOLN
0.0000 [IU] | Freq: Three times a day (TID) | INTRAMUSCULAR | Status: DC
  Administered 2022-09-07: 1 [IU] via SUBCUTANEOUS
  Administered 2022-09-07: 2 [IU] via SUBCUTANEOUS
  Administered 2022-09-07: 1 [IU] via SUBCUTANEOUS
  Filled 2022-09-03 (×4): qty 1

## 2022-09-03 MED ORDER — POTASSIUM CHLORIDE 20 MEQ PO PACK
40.0000 meq | PACK | Freq: Once | ORAL | Status: AC
  Administered 2022-09-03: 40 meq via ORAL
  Filled 2022-09-03: qty 2

## 2022-09-03 MED ORDER — RENA-VITE PO TABS
1.0000 | ORAL_TABLET | Freq: Every day | ORAL | Status: DC
  Administered 2022-09-04 – 2022-09-11 (×7): 1 via ORAL
  Filled 2022-09-03 (×7): qty 1

## 2022-09-03 MED ORDER — GLUCOSE 40 % PO GEL
ORAL | Status: AC
  Filled 2022-09-03: qty 1

## 2022-09-03 MED ORDER — PANCRELIPASE (LIP-PROT-AMYL) 36000-114000 UNITS PO CPEP: 36000.0000 [IU] | ORAL_CAPSULE | ORAL | Status: DC | PRN

## 2022-09-03 MED ORDER — ACETAMINOPHEN 650 MG RE SUPP: 650.0000 mg | Freq: Four times a day (QID) | RECTAL | Status: DC | PRN

## 2022-09-03 MED ORDER — ONDANSETRON HCL 4 MG/2ML IJ SOLN
4.0000 mg | Freq: Once | INTRAMUSCULAR | Status: DC
  Filled 2022-09-03: qty 2

## 2022-09-03 MED ORDER — HYDROCODONE-ACETAMINOPHEN 5-325 MG PO TABS
1.0000 | ORAL_TABLET | Freq: Three times a day (TID) | ORAL | Status: DC
  Administered 2022-09-03 – 2022-09-15 (×28): 1 via ORAL
  Filled 2022-09-03 (×30): qty 1

## 2022-09-03 MED ORDER — ACETAMINOPHEN 325 MG PO TABS
650.0000 mg | ORAL_TABLET | Freq: Four times a day (QID) | ORAL | Status: DC | PRN
  Administered 2022-09-04: 650 mg via ORAL
  Filled 2022-09-03: qty 2

## 2022-09-03 MED ORDER — MECLIZINE HCL 25 MG PO TABS: 25.0000 mg | ORAL_TABLET | Freq: Four times a day (QID) | ORAL | Status: DC | PRN

## 2022-09-03 MED ORDER — VITAMIN B-12 1000 MCG PO TABS
1000.0000 ug | ORAL_TABLET | Freq: Every day | ORAL | Status: DC
  Administered 2022-09-04 – 2022-09-11 (×7): 1000 ug via ORAL
  Filled 2022-09-03 (×8): qty 1

## 2022-09-03 MED ORDER — SEVELAMER CARBONATE 800 MG PO TABS
800.0000 mg | ORAL_TABLET | Freq: Three times a day (TID) | ORAL | Status: DC
  Administered 2022-09-04 – 2022-09-07 (×7): 800 mg via ORAL
  Filled 2022-09-03 (×8): qty 1

## 2022-09-03 MED ORDER — PANCRELIPASE (LIP-PROT-AMYL) 36000-114000 UNITS PO CPEP: 36000.0000 [IU] | ORAL_CAPSULE | Freq: Three times a day (TID) | ORAL | Status: DC

## 2022-09-03 MED ORDER — PANTOPRAZOLE SODIUM 40 MG PO TBEC: 40.0000 mg | DELAYED_RELEASE_TABLET | Freq: Two times a day (BID) | ORAL | Status: DC

## 2022-09-03 MED ORDER — VITAMIN D 25 MCG (1000 UNIT) PO TABS
1000.0000 [IU] | ORAL_TABLET | Freq: Every day | ORAL | Status: DC
  Administered 2022-09-04 – 2022-09-11 (×7): 1000 [IU] via ORAL
  Filled 2022-09-03 (×10): qty 1

## 2022-09-03 MED ORDER — PANTOPRAZOLE SODIUM 40 MG IV SOLR
40.0000 mg | Freq: Two times a day (BID) | INTRAVENOUS | Status: DC
  Administered 2022-09-03 – 2022-09-11 (×15): 40 mg via INTRAVENOUS
  Filled 2022-09-03 (×17): qty 10

## 2022-09-03 MED ORDER — FUROSEMIDE 40 MG PO TABS
80.0000 mg | ORAL_TABLET | Freq: Every day | ORAL | Status: DC
  Administered 2022-09-04 – 2022-09-07 (×3): 80 mg via ORAL
  Filled 2022-09-03 (×3): qty 2

## 2022-09-03 MED ORDER — RISAQUAD PO CAPS
1.0000 | ORAL_CAPSULE | Freq: Every day | ORAL | Status: DC
  Administered 2022-09-04 – 2022-09-11 (×7): 1 via ORAL
  Filled 2022-09-03 (×8): qty 1

## 2022-09-03 MED ORDER — POLYETHYLENE GLYCOL 3350 17 G PO PACK: 17.0000 g | PACK | Freq: Every day | ORAL | Status: DC | PRN

## 2022-09-03 MED ORDER — PROMETHAZINE HCL 25 MG PO TABS
25.0000 mg | ORAL_TABLET | Freq: Once | ORAL | Status: AC
  Administered 2022-09-03: 25 mg via ORAL
  Filled 2022-09-03: qty 1

## 2022-09-03 MED ORDER — MAGNESIUM HYDROXIDE 400 MG/5ML PO SUSP: 30.0000 mL | Freq: Every day | ORAL | Status: DC | PRN

## 2022-09-03 MED ORDER — PANCRELIPASE (LIP-PROT-AMYL) 36000-114000 UNITS PO CPEP
72000.0000 [IU] | ORAL_CAPSULE | Freq: Three times a day (TID) | ORAL | Status: DC
  Administered 2022-09-04 – 2022-09-12 (×20): 72000 [IU] via ORAL
  Filled 2022-09-03 (×21): qty 2

## 2022-09-03 MED ORDER — ALBUTEROL SULFATE (2.5 MG/3ML) 0.083% IN NEBU: 3.0000 mL | INHALATION_SOLUTION | RESPIRATORY_TRACT | Status: DC | PRN

## 2022-09-03 MED ORDER — FAMOTIDINE IN NACL 20-0.9 MG/50ML-% IV SOLN
20.0000 mg | Freq: Once | INTRAVENOUS | Status: AC
  Administered 2022-09-03: 20 mg via INTRAVENOUS
  Filled 2022-09-03: qty 50

## 2022-09-03 MED ORDER — AMLODIPINE BESYLATE 10 MG PO TABS
10.0000 mg | ORAL_TABLET | Freq: Every day | ORAL | Status: DC
  Administered 2022-09-04 – 2022-09-11 (×6): 10 mg via ORAL
  Filled 2022-09-03 (×8): qty 1

## 2022-09-03 MED ORDER — HYDRALAZINE HCL 50 MG PO TABS
50.0000 mg | ORAL_TABLET | Freq: Two times a day (BID) | ORAL | Status: DC
  Administered 2022-09-03 – 2022-09-11 (×14): 50 mg via ORAL
  Filled 2022-09-03 (×17): qty 1

## 2022-09-03 MED ORDER — SEVELAMER CARBONATE 800 MG PO TABS: 800.0000 mg | ORAL_TABLET | Freq: Four times a day (QID) | ORAL | Status: DC

## 2022-09-03 MED ORDER — MORPHINE SULFATE (PF) 4 MG/ML IV SOLN
4.0000 mg | Freq: Once | INTRAVENOUS | Status: AC
  Administered 2022-09-03: 4 mg via INTRAVENOUS
  Filled 2022-09-03: qty 1

## 2022-09-03 MED ORDER — PROMETHAZINE HCL 25 MG PO TABS
12.5000 mg | ORAL_TABLET | Freq: Four times a day (QID) | ORAL | Status: DC | PRN
  Administered 2022-09-05 (×2): 12.5 mg via ORAL
  Filled 2022-09-03 (×3): qty 1

## 2022-09-03 MED ORDER — MIRTAZAPINE 15 MG PO TABS
7.5000 mg | ORAL_TABLET | Freq: Every day | ORAL | Status: DC
  Administered 2022-09-03 – 2022-09-11 (×8): 7.5 mg via ORAL
  Filled 2022-09-03 (×9): qty 1

## 2022-09-03 MED ORDER — HYDROXYZINE HCL 25 MG PO TABS
25.0000 mg | ORAL_TABLET | Freq: Three times a day (TID) | ORAL | Status: DC | PRN
  Administered 2022-09-11: 25 mg via ORAL
  Filled 2022-09-03 (×2): qty 1

## 2022-09-03 MED ORDER — ENOXAPARIN SODIUM 40 MG/0.4ML IJ SOSY: 40.0000 mg | PREFILLED_SYRINGE | INTRAMUSCULAR | Status: DC

## 2022-09-03 MED ORDER — SODIUM CHLORIDE 0.9 % IV SOLN
12.5000 mg | Freq: Once | INTRAVENOUS | Status: DC
  Filled 2022-09-03: qty 0.5

## 2022-09-03 MED ORDER — SEVELAMER CARBONATE 800 MG PO TABS: 800.0000 mg | ORAL_TABLET | Freq: Every day | ORAL | Status: DC | PRN

## 2022-09-03 MED ORDER — FLUTICASONE PROPIONATE 50 MCG/ACT NA SUSP
2.0000 | Freq: Every day | NASAL | Status: DC
  Administered 2022-09-07 – 2022-09-11 (×5): 2 via NASAL
  Filled 2022-09-03: qty 16

## 2022-09-03 MED ORDER — TRAZODONE HCL 50 MG PO TABS
25.0000 mg | ORAL_TABLET | Freq: Every evening | ORAL | Status: DC | PRN
  Administered 2022-09-06 – 2022-09-10 (×3): 25 mg via ORAL
  Filled 2022-09-03 (×6): qty 1

## 2022-09-03 MED ORDER — FERROUS SULFATE 325 (65 FE) MG PO TABS
325.0000 mg | ORAL_TABLET | Freq: Every day | ORAL | Status: DC
  Administered 2022-09-04 – 2022-09-07 (×3): 325 mg via ORAL
  Filled 2022-09-03 (×3): qty 1

## 2022-09-03 MED ORDER — HEPARIN SODIUM (PORCINE) 5000 UNIT/ML IJ SOLN
5000.0000 [IU] | Freq: Three times a day (TID) | INTRAMUSCULAR | Status: DC
  Administered 2022-09-03 – 2022-09-12 (×24): 5000 [IU] via SUBCUTANEOUS
  Filled 2022-09-03 (×26): qty 1

## 2022-09-03 MED ORDER — NEPRO/CARBSTEADY PO LIQD
237.0000 mL | Freq: Two times a day (BID) | ORAL | Status: DC
  Administered 2022-09-07 – 2022-09-09 (×2): 237 mL via ORAL

## 2022-09-03 NOTE — ED Notes (Signed)
Pts BG recheck was still low, MD aware and ordered 10% dextrose. When RN went to start fluids, IV had gone bad. Her IV would not flush or draw back, writer even took off IV dressing to reposition and that did not work. MD aware. 2 oral gel packets given to pt. She was able to tolerate oral gel at the time where two assisting nurses helped place a new IV. Glucose recheck after 1 gel pack was 60. 10% dextrose started after IV was placed.

## 2022-09-03 NOTE — ED Provider Notes (Addendum)
Care assumed of patient from outgoing provider.  See their note for initial history, exam and plan.  Clinical Course as of 09/03/22 1521  Wed Sep 03, 2022  1520 ESRD, HD yesterday.  Upper abd pain with n/v - f/u CT scan [SM]    Clinical Course User Index [SM] Robin Man, MD   CT abdomen and pelvis with contrast showed no signs of an acute intra-abdominal process.  Volume overload but appears similar to prior.  Gastric wall thickening.  Bladder wall thickening.  Patient given IV Pepcid.  Concern for possible gastritis.  EKG showed normal sinus rhythm.  Normal intervals.  No chamber enlargement.  No significant ST elevation or depression.  No signs of acute ischemia or dysrhythmia.  Attempted to tolerate p.o. however patient with recurrent episodes of nausea and vomiting.  Worsening fatigue.  Hypoglycemia.  Attempted to give oral hydration and food however patient was not tolerating.  Ongoing nausea and upper abdominal pain.  Given IV Phenergan and IV D10 for persistent hypoglycemia.  Patient will require admission for intractable nausea and vomiting in the setting of gastritis.  .Critical Care  Performed by: Robin Man, MD Authorized by: Robin Man, MD   Critical care provider statement:    Critical care time (minutes):  30   Critical care time was exclusive of:  Separately billable procedures and treating other patients   Critical care was necessary to treat or prevent imminent or life-threatening deterioration of the following conditions:  Endocrine crisis   Critical care was time spent personally by me on the following activities:  Development of treatment plan with patient or surrogate, discussions with consultants, evaluation of patient's response to treatment, examination of patient, ordering and review of laboratory studies, ordering and review of radiographic studies, ordering and performing treatments and interventions, pulse oximetry, re-evaluation of patient's condition  and review of old charts     Robin Man, MD 09/03/22 1650    Robin Man, MD 09/03/22 1923

## 2022-09-03 NOTE — Assessment & Plan Note (Addendum)
Nephrology consulted for dialysis.  After discussion with plan of care patient is Maintelyte that she wishes to stop dialysis and has taken steps to ensure that her wishes are followed including a living will completed 12/21 as patient had concerns that once uremia started, her wishes may be reversed by her healthcare power of attorney.  She felt this this will would ensure her wishes are continued.  Now fully made comfort care.

## 2022-09-03 NOTE — H&P (Addendum)
[place]   PATIENT NAME: Shanai Lartigue    MR#:  [id number]  DATE OF BIRTH:  05--1962  DATE OF ADMISSION:  09/03/2022  PRIMARY CARE PHYSICIAN: Denton Lank, MD   Patient is coming from: Home  REQUESTING/REFERRING PHYSICIAN: Nathaniel Man, MD  CHIEF COMPLAINT:   Chief Complaint  Patient presents with   Abdominal Pain    HISTORY OF PRESENT ILLNESS:  LAYLA KESLING is a 61 y.o. African-American female with medical history significant for end-stage renal disease on hemodialysis on MWF, asthma, osteoarthritis, chronic diastolic CHF, COPD, coronary artery disease, type 2 diabetes mellitus, gastroparesis, GERD, diverticulosis and peripheral neuropathy, who presented to the ER with acute onset of intractable nausea and vomiting since last night with no bilious vomitus or hematemesis.  She admits to epigastric abdominal pain and denies any diarrhea.  No melena or bright red being per rectum.  No fever or chills.  No cough or wheezing or hemoptysis.  ED Course: Upon presentation to the emergency room, BP was 177/70 and pulse oximetry 98% on 2 L of O2 nasal cannula with with otherwise normal vital signs.  Labs reveal hypokalemia at 3.1 and a creatinine of 4 with a BUN of 7.  Albumin 2.7.  CBC showed hemoglobin 8.9 hematocrit 28 close to previous levels and platelets of 110 compared to 162 last month.  Fingerstick blood glucose was 46 and later 50.  And then came up to 173.  EKG as reviewed by me : EKG showed sinus bradycardia with a rate of 59 with low voltage QRS Imaging: Two-view chest x-ray mild congestive heart failure. Abdominal and pelvic CT scan revealed the following: 1.  No acute process in the abdomen or pelvis. 2. Fluid overload, as evidenced by similar anasarca, small volume abdominopelvic ascites, and small bilateral pleural effusions. Worsened bibasilar aeration, likely related to increased interstitial and alveolar edema. 3. Apparent gastric wall thickening which could be  partially due to underdistention. Correlate with symptoms of gastritis. 4. Bladder wall thickening, suspicious for cystitis. 5. Similar and mildly improved abdominopelvic adenopathy, favoring a reactive etiology. As before, laboratory and clinical correlation for lymphoproliferative process suggested. 6. Coronary artery atherosclerosis. Aortic Atherosclerosis.  The patient was given 20 g of IV Pepcid, 4 mg of IV morphine sulfate and 4 g of IV Zofran and 12.5 mg of IV Phenergan and 25 mg p.o.  She will be admitted to a medical telemetry observation bed for further evaluation and management. PAST MEDICAL HISTORY:   Past Medical History:  Diagnosis Date   Anemia    Anginal pain ([place])    Anxiety    Arthritis    Asthma    Broken wrist    Bronchitis    chronic diastolic CHF 8/50/2774   Chronic kidney disease    COPD (chronic obstructive pulmonary disease) ([place])    Coronary artery disease    a. cath 2013: stenting to RCA (report not available); b. cath 2014: LM nl, pLAD 40%, mLAD nl, ost LCx 40%, mid LCx nl, pRCA 30% @ site of prior stent, mRCA 50%   Depression    Diabetes mellitus ([place])    Diabetes mellitus without complication ([place])    Diabetic neuropathy ([place])    dialysis 2006   Diverticulosis    Dizziness    Dyspnea    Elevated lipids    Environmental and seasonal allergies    ESRD (end stage renal disease) on dialysis ([place])    M-W-F   Gastroparesis  GERD (gastroesophageal reflux disease)    Headache    History of anemia due to chronic kidney disease    History of hiatal hernia    HOH (hard of hearing)    Hx of pancreatitis 2015   Hypertension    Intractable nausea and vomiting 09/03/2022   Lower extremity edema    Mitral regurgitation    a. echo 10/2013: EF 62%, noWMA, mildly dilated LA, mild to mod MR/TR, GR1DD   Myocardial infarction ([place])    Orthopnea    Parathyroid abnormality ([place])    Peripheral arterial disease ([place])    Pneumonia    Renal cancer ([place])     Renal insufficiency    Pt is on dialysis on M,W + F.   Wheezing     PAST SURGICAL HISTORY:   Past Surgical History:  Procedure Laterality Date   A/V SHUNTOGRAM Left 01/20/2018   Procedure: A/V SHUNTOGRAM;  Surgeon: Algernon Huxley, MD;  Location: [place] [facility];  Service: Cardiovascular;  Laterality: Left;   ABDOMINAL HYSTERECTOMY  1992   AMPUTATION TOE Left 10/02/2017   Procedure: AMPUTATION TOE-LEFT GREAT TOE;  Surgeon: Albertine Patricia, DPM;  Location: [facility];  Service: Podiatry;  Laterality: Left;   APPENDECTOMY     APPLICATION OF WOUND VAC N/A 11/25/2019   Procedure: APPLICATION OF WOUND VAC;  Surgeon: Katha Cabal, MD;  Location: [facility];  Service: Vascular;  Laterality: N/A;   ARTERY BIOPSY Right 10/11/2018   Procedure: BIOPSY TEMPORAL ARTERY;  Surgeon: Vickie Epley, MD;  Location: [facility];  Service: General;  Laterality: Right;   BREAST CYST EXCISION     CARDIAC CATHETERIZATION Left 07/26/2015   Procedure: Left Heart Cath and Coronary Angiography;  Surgeon: Dionisio David, MD;  Location: [place] [facility];  Service: Cardiovascular;  Laterality: Left;   CATARACT EXTRACTION W/ INTRAOCULAR LENS IMPLANT Right    CATARACT EXTRACTION W/PHACO Left 03/10/2017   Procedure: CATARACT EXTRACTION PHACO AND INTRAOCULAR LENS PLACEMENT (IOC);  Surgeon: Birder Robson, MD;  Location: [facility];  Service: Ophthalmology;  Laterality: Left;  [place] 00:51.9 AP% 14.2 CDE 7.39 fluid pack lot # [id number]   CENTRAL LINE INSERTION Right 11/11/2019   Procedure: CENTRAL LINE INSERTION;  Surgeon: Katha Cabal, MD;  Location: [facility];  Service: Vascular;  Laterality: Right;   CENTRAL LINE INSERTION  11/25/2019   Procedure: CENTRAL LINE INSERTION;  Surgeon: Katha Cabal, MD;  Location: [facility];  Service: Vascular;;   CHOLECYSTECTOMY     COLONOSCOPY WITH PROPOFOL N/A 08/12/2016   Procedure: COLONOSCOPY WITH PROPOFOL;  Surgeon: Lollie Sails, MD;  Location: [facility]  ENDOSCOPY;  Service: Endoscopy;  Laterality: N/A;   DIALYSIS FISTULA CREATION Left    upper arm   dialysis grafts     DIALYSIS/PERMA CATHETER INSERTION N/A 11/14/2019   Procedure: DIALYSIS/PERMA CATHETER INSERTION;  Surgeon: Algernon Huxley, MD;  Location: [place] [facility];  Service: Cardiovascular;  Laterality: N/A;   DIALYSIS/PERMA CATHETER INSERTION N/A 02/03/2020   Procedure: DIALYSIS/PERMA CATHETER INSERTION;  Surgeon: Katha Cabal, MD;  Location: [place] [facility];  Service: Cardiovascular;  Laterality: N/A;   DIALYSIS/PERMA CATHETER INSERTION N/A 06/19/2020   Procedure: DIALYSIS/PERMA CATHETER INSERTION;  Surgeon: Katha Cabal, MD;  Location: [place] [facility];  Service: Cardiovascular;  Laterality: N/A;   DIALYSIS/PERMA CATHETER INSERTION N/A 05/07/2021   Procedure: DIALYSIS/PERMA CATHETER INSERTION;  Surgeon: Katha Cabal, MD;  Location: [place] [facility];  Service: Cardiovascular;  Laterality: N/A;  Patient needs general anethesia   DIALYSIS/PERMA CATHETER INSERTION N/A 06/17/2022   Procedure: DIALYSIS/PERMA CATHETER INSERTION;  Surgeon: Katha Cabal, MD;  Location: [place] [facility];  Service: Cardiovascular;  Laterality: N/A;   DIALYSIS/PERMA CATHETER REMOVAL N/A 05/25/2020   Procedure: DIALYSIS/PERMA CATHETER REMOVAL;  Surgeon: Katha Cabal, MD;  Location: [place] [facility];  Service: Cardiovascular;  Laterality: N/A;   ESOPHAGOGASTRODUODENOSCOPY N/A 03/08/2015   Procedure: ESOPHAGOGASTRODUODENOSCOPY (EGD);  Surgeon: Manya Silvas, MD;  Location: [facility] ENDOSCOPY;  Service: Endoscopy;  Laterality: N/A;   ESOPHAGOGASTRODUODENOSCOPY (EGD) WITH PROPOFOL N/A 03/18/2016   Procedure: ESOPHAGOGASTRODUODENOSCOPY (EGD) WITH PROPOFOL;  Surgeon: [name], MD;  Location: [facility]OSCOPY;  Service: Endoscopy;  Laterality: N/A;   EYE SURGERY Right 2018   FECAL TRANSPLANT N/A 08/23/2015   Procedure: FECAL TRANSPLANT;  Surgeon: Manya Silvas, MD;   Location[facility] ENDOSCOPY;  Service: Endoscopy;  Laterality: N/A;   HAND SURGERY Bilateral    HEMATOMA EVACUATION Left 11/25/2019   Procedure: EVACUATION HEMATOMA;  Surgeon: Katha Cabal, MD;  Location: [facility];  Service: Vascular;  Laterality: Left;   I & D EXTREMITY Left 11/25/2019   Procedure: IRRIGATION AND DEBRIDEMENT EXTREMITY;  Surgeon: Katha Cabal, MD;  Location: [facility];  Service: Vascular;  Laterality: Left;   IR FLUORO GUIDE CV LINE RIGHT  04/06/2020   IR INJECT/THERA/INC NEEDLE/CATH/PLC EPI/CERV/THOR W/IMG  08/13/2020   IR RADIOLOGIST EVAL & MGMT  07/28/2019   IR RADIOLOGIST EVAL & MGMT  08/11/2019   LIGATION OF ARTERIOVENOUS  FISTULA Left 11/11/2019   LIGATION OF ARTERIOVENOUS  FISTULA Left 11/11/2019   Procedure: LIGATION OF ARTERIOVENOUS  FISTULA;  Surgeon: Katha Cabal, MD;  Location: [facility];  Service: Vascular;  Laterality: Left;   LIGATIONS OF HERO GRAFT Right 06/13/2020   Procedure: LIGATION / REMOVAL OF RIGHT HERO GRAFT;  Surgeon: Katha Cabal, MD;  Location: [facility];  Service: Vascular;  Laterality: Right;   PERIPHERAL VASCULAR CATHETERIZATION N/A 12/20/2015   Procedure: Thrombectomy of dialysis access versus permcath placement;  Surgeon: Algernon Huxley, MD;  Location: [place] [facility];  Service: Cardiovascular;  Laterality: N/A;   PERIPHERAL VASCULAR CATHETERIZATION N/A 12/20/2015   Procedure: A/V Shunt Intervention;  Surgeon: Algernon Huxley, MD;  Location: [place] [facility];  Service: Cardiovascular;  Laterality: N/A;   PERIPHERAL VASCULAR CATHETERIZATION N/A 12/20/2015   Procedure: A/V Shuntogram/Fistulagram;  Surgeon: Algernon Huxley, MD;  Location: [place] [facility];  Service: Cardiovascular;  Laterality: N/A;   PERIPHERAL VASCULAR CATHETERIZATION N/A 01/02/2016   Procedure: A/V Shuntogram/Fistulagram;  Surgeon: Algernon Huxley, MD;  Location: [place] [facility];  Service: Cardiovascular;  Laterality: N/A;   PERIPHERAL VASCULAR  CATHETERIZATION N/A 01/02/2016   Procedure: A/V Shunt Intervention;  Surgeon: Algernon Huxley, MD;  Location: [place] [facility];  Service: Cardiovascular;  Laterality: N/A;   TEE WITHOUT CARDIOVERSION N/A 06/11/2020   Procedure: TRANSESOPHAGEAL ECHOCARDIOGRAM (TEE);  Surgeon: Teodoro Spray, MD;  Location: [facility];  Service: Cardiovascular;  Laterality: N/A;   UPPER EXTREMITY ANGIOGRAPHY Bilateral 11/27/2020   Procedure: UPPER EXTREMITY ANGIOGRAPHY;  Surgeon: Katha Cabal, MD;  Location: [place] [facility];  Service: Cardiovascular;  Laterality: Bilateral;   UPPER EXTREMITY VENOGRAPHY Right 01/18/2020   Procedure: UPPER EXTREMITY VENOGRAPHY;  Surgeon: Katha Cabal, MD;  Location: [place] [facility];  Service: Cardiovascular;  Laterality: Right;   UPPER EXTREMITY VENOGRAPHY Bilateral 11/27/2020   Procedure: UPPER EXTREMITY VENOGRAPHY;  Surgeon: Katha Cabal,  MD;  Location: [place] [facility];  Service: Cardiovascular;  Laterality: Bilateral;   UPPER EXTREMITY VENOGRAPHY Left 11/05/2021   Procedure: UPPER EXTREMITY VENOGRAPHY;  Surgeon: Katha Cabal, MD;  Location: [place] [facility];  Service: Cardiovascular;  Laterality: Left;   VASCULAR ACCESS DEVICE INSERTION Right 04/13/2020   Procedure: INSERTION OF HERO VASCULAR ACCESS DEVICE (GRAFT);  Surgeon: Katha Cabal, MD;  Location: [facility];  Service: Vascular;  Laterality: Right;    SOCIAL HISTORY:   Social History   Tobacco Use   Smoking status: Never   Smokeless tobacco: Never  Substance Use Topics   Alcohol use: Not Currently    Comment: glass wine week per pt    FAMILY HISTORY:   Family History  Problem Relation Age of Onset   Kidney disease Mother    Diabetes Mother    Cancer Father    Kidney disease Sister     DRUG ALLERGIES:   Allergies  Allergen Reactions   Ace Inhibitors Swelling and Anaphylaxis   Ativan [Lorazepam] Other (See Comments)    Reaction:  Hallucinations and headaches    Compazine [Prochlorperazine Edisylate] Anaphylaxis, Nausea And Vomiting and Other (See Comments)    Other reaction(s): dystonia from this vs. Reglan, 23 Jul - patient relates that she takes promethazine frequently with no problems   Sumatriptan Succinate Other (See Comments)    Other reaction(s): delirium and hallucinations per [facility] records   Zofran [Ondansetron] Nausea And Vomiting    Per pt. she is allergic to zofran or will experience adverse reaction like hallucination    Losartan Nausea Only   Prochlorperazine Other (See Comments)    Reaction:  Unknown . Patient does not remember reaction but she does have vertigo and anxiety along with n and v at times. Could be used to treat any of these    Reglan [Metoclopramide] Other (See Comments)    Per patient her Dr. Evelina Bucy her off it    Scopolamine Other (See Comments)    Dizziness, also has vertigo already   Tape Rash    Plastic tape causes rash   Tapentadol Rash   Ultrasound Gel Itching    Patient states the Ultrasound gel caused itching while in skin contact and resolved when wiped away.    REVIEW OF SYSTEMS:   ROS As per history of present illness. All pertinent systems were reviewed above. Constitutional, HEENT, cardiovascular, respiratory, GI, GU, musculoskeletal, neuro, psychiatric, endocrine, integumentary and hematologic systems were reviewed and are otherwise negative/unremarkable except for positive findings mentioned above in the HPI.   MEDICATIONS AT HOME:   Prior to Admission medications   Medication Sig Start Date End Date Taking? Authorizing Provider  atorvastatin (LIPITOR) 20 MG tablet Take 20 mg by mouth daily. 08/20/22  Yes [provider]  colestipol (COLESTID) 1 g tablet Take 2 g by mouth 2 (two) times daily. 08/11/22 08/11/23 Yes [provider]  acetaminophen (TYLENOL) 325 MG tablet Take 2 tablets (650 mg total) by mouth every 6 (six) hours as needed for mild pain, headache, moderate pain or  fever (fever >/= 101). 12/06/21   Bonnielee Haff, MD  albuterol (VENTOLIN HFA) 108 (90 Base) MCG/ACT inhaler Inhale 2 puffs into the lungs every 4 (four) hours as needed for wheezing or shortness of breath. 11/09/19   [provider]  alum & mag hydroxide-simeth (MAALOX/MYLANTA) 200-200-20 MG/5ML suspension Take 15 mLs by mouth every 6 (six) hours as needed for indigestion or heartburn. 06/25/20   Enzo Bi, MD  amLODipine (NORVASC) 10 MG tablet Take 1 tablet (10 mg total) by mouth daily. 06/25/20   Enzo Bi, MD  ammonium lactate (LAC-HYDRIN) 12 % lotion Apply 1 application topically 2 (two) times daily as needed for dry skin.     [provider]  aspirin EC 81 MG tablet Take 81 mg by mouth daily.    [provider]  calcium carbonate (TUMS - DOSED IN MG ELEMENTAL CALCIUM) 500 MG chewable tablet Chew 2.5 tablets (500 mg of elemental calcium total) by mouth every 6 (six) hours as needed for indigestion. 06/25/20   Enzo Bi, MD  Cholecalciferol (VITAMIN D3) 25 MCG (1000 UT) CAPS Take 1,000 Units by mouth daily.     [provider]  CREON 36000-114000 units CPEP capsule Take 36,000 Units by mouth. Take 2 capsules with meals and 1 capsule with snacks 03/12/22   [provider]  cyanocobalamin 1000 MCG tablet Take 1,000 mcg by mouth daily.     [provider]  epoetin alfa (EPOGEN) 10000 UNIT/ML injection Inject 1 mL (10,000 Units total) into the vein every Monday, Wednesday, and Friday with hemodialysis. 06/27/20   Enzo Bi, MD  Ferrous Sulfate (IRON) 325 (65 Fe) MG TABS Take 325 mg by mouth daily.     [provider]  fluticasone (FLONASE) 50 MCG/ACT nasal spray Place 2 sprays into both nostrils daily. 10/20/21   [provider]  furosemide (LASIX) 80 MG tablet Take 80 mg by mouth daily.    [provider]  gabapentin (NEURONTIN) 100 MG capsule Take 100 mg by mouth 3 (three) times daily.    [provider]  hydrALAZINE  (APRESOLINE) 100 MG tablet Take 0.5 tablets (50 mg total) by mouth 2 (two) times daily. 12/06/21   Bonnielee Haff, MD  HYDROcodone-acetaminophen (NORCO/VICODIN) 5-325 MG tablet Take 1 tablet by mouth as directed. 04/18/22   [provider]  hydrOXYzine (ATARAX) 25 MG tablet Take 25 mg by mouth 3 (three) times daily as needed.    [provider]  lidocaine (LIDODERM) 5 % Place 1 patch onto the skin daily. (Remove after 12 hours -- leave off for 12 hours before applying new patch)    [provider]  meclizine (ANTIVERT) 25 MG tablet Take 25 mg by mouth every 6 (six) hours as needed for dizziness.    [provider]  mirtazapine (REMERON) 7.5 MG tablet Take 7.5 mg by mouth daily. 04/15/21   [provider]  multivitamin (RENA-VIT) TABS tablet Take 1 tablet by mouth daily.     [provider]  mupirocin ointment (BACTROBAN) 2 % Apply 1 application topically daily as needed (leg rash).  11/09/19   [provider]  Nutritional Supplements (FEEDING SUPPLEMENT, NEPRO CARB STEADY,) LIQD Take 237 mLs by mouth 2 (two) times daily between meals. 12/17/20   Lorella Nimrod, MD  pantoprazole (PROTONIX) 40 MG tablet Take 1 tablet (40 mg total) by mouth 2 (two) times daily. 11/17/21 12/17/21  Sharen Hones, MD  Probiotic Product (PROBIOTIC PO) Take 1 capsule by mouth daily.    [provider]  promethazine (PHENERGAN) 12.5 MG tablet Take 1 tablet (12.5 mg total) by mouth every 6 (six) hours as needed for nausea or vomiting. 05/10/21   Wouk, Ailene Rud, MD  sevelamer carbonate (RENVELA) 800 MG tablet Take 800 mg by mouth 4 (four) times daily. (Take with meals and snack)    [provider]      VITAL SIGNS:  Blood pressure (!) 144/55,  pulse (!) 59, temperature 98.2 F (36.8 C), temperature source Oral, resp. rate (!) 8, height '5\' 5"'$  (1.651 m), weight 79.2 kg, SpO2 100 %.  PHYSICAL EXAMINATION:  Physical Exam  GENERAL:  61 y.o.-year-old  African-American female patient lying in the bed with no acute distress.  EYES: Pupils equal, round, reactive to light and accommodation. No scleral icterus. Extraocular muscles intact.  HEENT: Head atraumatic, normocephalic. Oropharynx and nasopharynx clear.  NECK:  Supple, no jugular venous distention. No thyroid enlargement, no tenderness.  LUNGS: Normal breath sounds bilaterally, no wheezing, rales,rhonchi or crepitation. No use of accessory muscles of respiration.  CARDIOVASCULAR: Regular rate and rhythm, S1, S2 normal. No murmurs, rubs, or gallops.  ABDOMEN: Soft, nondistended, nontender. Bowel sounds present. No organomegaly or mass.  EXTREMITIES: No pedal edema, cyanosis, or clubbing.  NEUROLOGIC: Cranial nerves II through XII are intact. Muscle strength 5/5 in all extremities. Sensation intact. Gait not checked.  PSYCHIATRIC: The patient is alert and oriented x 3.  Normal affect and good eye contact. SKIN: No obvious rash, lesion, or ulcer.   LABORATORY PANEL:   CBC Recent Labs  Lab 09/03/22 1313  WBC 4.1  HGB 8.9*  HCT 28.0*  PLT 110*   ------------------------------------------------------------------------------------------------------------------  Chemistries  Recent Labs  Lab 09/03/22 1313  NA 136  K 3.1*  CL 97*  CO2 32  GLUCOSE 84  BUN 7*  CREATININE 4.02*  CALCIUM 7.8*  MG 1.9  AST 25  ALT 9  ALKPHOS 76  BILITOT 0.6   ------------------------------------------------------------------------------------------------------------------  Cardiac Enzymes No results for input(s): "TROPONINI" in the last 168 hours. ------------------------------------------------------------------------------------------------------------------  RADIOLOGY:  CT ABDOMEN PELVIS W CONTRAST  Result Date: 09/03/2022 CLINICAL DATA:  Umbilical abdominal pain.  Patient on dialysis. EXAM: CT ABDOMEN AND PELVIS WITH CONTRAST TECHNIQUE: Multidetector CT imaging of the abdomen and  pelvis was performed using the standard protocol following bolus administration of intravenous contrast. RADIATION DOSE REDUCTION: This exam was performed according to the departmental dose-optimization program which includes automated exposure control, adjustment of the mA and/or kV according to patient size and/or use of iterative reconstruction technique. CONTRAST:  111m OMNIPAQUE IOHEXOL 300 MG/ML  SOLN COMPARISON:  07/01/2022 FINDINGS: Lower chest: Progressive septal thickening at the lung bases. Areas of peribronchovascular ground-glass. Moderate cardiomegaly. Right coronary artery calcification. Incompletely imaged central line. Small bilateral pleural effusions. Hepatobiliary: Normal liver. Cholecystectomy. No biliary duct dilatation. Pancreas: Pancreatic atrophy.  No duct dilatation. Spleen: Normal in size, without focal abnormality. Adrenals/Urinary Tract: Normal adrenal glands. Bilateral native renal marked atrophy. Interpolar left renal 1.7 cm cyst or minimally complex cyst . In the absence of clinically indicated signs/symptoms require(s) no independent follow-up. No hydronephrosis. The bladder is moderately thick walled including on 72/3. Stomach/Bowel: The stomach is incompletely distended. Again identified is apparent wall thickening including within the antrum on 34/3. Extensive colonic diverticulosis. Normal terminal ileum. Appendix not visualized. Normal small bowel. Vascular/Lymphatic: Markedly age advanced aortic and branch vessel atherosclerosis. Index left abdominal retroperitoneal 1.1 cm node on 30/3 similar to 1.2 cm on the prior. Gastrohepatic ligament 11 mm node on 19/3 is relatively similar to on the prior. Left inguinal index node measures 1.2 cm on 79/3 versus 1.4 cm on the prior. Left external iliac 1.4 cm node on 68/3 measured 1.5 cm previously. Reproductive: Hysterectomy. Other: No free intraperitoneal air. Small volume abdominopelvic ascites is similar. Diffuse anasarca.  Musculoskeletal: Renal osteodystrophy. IMPRESSION: 1.  No acute process in the abdomen or pelvis. 2. Fluid overload, as evidenced by similar anasarca,  small volume abdominopelvic ascites, and small bilateral pleural effusions. Worsened bibasilar aeration, likely related to increased interstitial and alveolar edema. 3. Apparent gastric wall thickening which could be partially due to underdistention. Correlate with symptoms of gastritis. 4. Bladder wall thickening, suspicious for cystitis. 5. Similar and mildly improved abdominopelvic adenopathy, favoring a reactive etiology. As before, laboratory and clinical correlation for lymphoproliferative process suggested. 6. Coronary artery atherosclerosis. Aortic Atherosclerosis (ICD10-I70.0). Electronically Signed   By: Abigail Miyamoto M.D.   On: 09/03/2022 16:15      IMPRESSION AND PLAN:  Assessment and Plan: * Intractable nausea and vomiting - This is likely secondary to acute gastritis that could be viral. - The patient will be admitted to an observation medical telemetry bed. - We will continue him on as needed antiemetics. - IV PPI therapy will be provided. - We will place her on clear liquids and advance as tolerated.  Type 1 diabetes mellitus with hypoglycemia without coma, without long-term current use of insulin ([place]) - This is a secondary to her intractable nausea and vomiting and acute gastritis. - She will be placed on hypoglycemia protocol. - Management otherwise as above.  Essential hypertension - We will continue her antihypertensives.  End-stage renal disease on hemodialysis [facility]) - Nephrology consult to be obtained. - I notified Dr. Candiss Norse about the patient. - The patient gets hemodialysis on Tuesday Thursday and Saturdays.  Dyslipidemia - We will continue statin therapy.  Peripheral neuropathy - We will continue Neurontin.   DVT prophylaxis: SQ heparin Advanced Care Planning:  Code Status: full code.  Family Communication:   The plan of care was discussed in details with the patient (and family). I answered all questions. The patient agreed to proceed with the above mentioned plan. Further management will depend upon hospital course. Disposition Plan: Back to previous home environment Consults called: none.  All the records are reviewed and case discussed with ED provider.  Status is: Observation  I certify that at the time of admission, it is my clinical judgment that the patient will require hospital care extending less than 2 midnights.                            Dispo: The patient is from: Home              Anticipated d/c is to: Home              Patient currently is not medically stable to d/c.              Difficult to place patient: No  Christel Mormon M.D on 09/03/2022 at 10:42 PM  [facility]   From 7 PM-7 AM, contact night-coverage [email or web address]  CC: Primary care physician; Denton Lank, MD

## 2022-09-03 NOTE — ED Triage Notes (Signed)
Pt here via [facility] with abd pain that started yesterday. Pt states the pain is around her hernia. Pt states he has vomiting as well.

## 2022-09-03 NOTE — Assessment & Plan Note (Addendum)
Hypoglycemia due to N/V and lack of PO intake. 12/19: sugars only 70-90's on D10 drip.  Have stopped fluids and checking for blood sugars altogether now that she is comfort care.  Will order some glucose gel intermittently.

## 2022-09-03 NOTE — Assessment & Plan Note (Addendum)
Statin discontinued after comfort care

## 2022-09-03 NOTE — Assessment & Plan Note (Addendum)
Initially from uncontrolled gastroparesis.  Taken off Reglan outpatient for unclear reasons.  Much improved with Phenergan and Reglan.  Stopped Reglan on 12/24 due to continued diarrhea.  Patient started having more persistent nausea despite oral Phenergan secondary to uremia.  IV team placed ultrasound-guided IV and medications resumed.

## 2022-09-03 NOTE — ED Notes (Signed)
EKG completed and given to Dr. Jori Moll

## 2022-09-03 NOTE — ED Notes (Signed)
Admitting dr. Ree Kida of pts potassium

## 2022-09-03 NOTE — ED Notes (Signed)
MD notified of CBG. Pt given orange juice and dinner tray ordered.

## 2022-09-03 NOTE — Assessment & Plan Note (Addendum)
Initially placed on home medications.  Medications discontinued now that she is comfort care

## 2022-09-03 NOTE — ED Provider Notes (Signed)
[facility] Provider Note    Event Date/Time   First MD Initiated Contact with Patient 09/03/22 1356     (approximate)   History   Abdominal Pain   HPI  [name] is a 61 y.o. female past medical history of COPD, end-stage renal disease, diabetes coronary disease who presents with abdominal pain.  Patient notes that symptoms been going on for the last 2 days.  Pain is rather diffuse but worse in the center of the abdomen.  Has been constant.  Patient ate custard barbecue last night and that made her feel worse and she had several episodes of emesis.  Last BM was this morning.  Has also felt very cold.  Denies shortness of breath or chest pain.  Last dialysis was yesterday.     Past Medical History:  Diagnosis Date   Anemia    Anginal pain (HCC)    Anxiety    Arthritis    Asthma    Broken wrist    Bronchitis    chronic diastolic CHF 1/88/4166   Chronic kidney disease    COPD (chronic obstructive pulmonary disease) ([place])    Coronary artery disease    a. cath 2013: stenting to RCA (report not available); b. cath 2014: LM nl, pLAD 40%, mLAD nl, ost LCx 40%, mid LCx nl, pRCA 30% @ site of prior stent, mRCA 50%   Depression    Diabetes mellitus ([place])    Diabetes mellitus without complication ([place])    Diabetic neuropathy ([place])    dialysis 2006   Diverticulosis    Dizziness    Dyspnea    Elevated lipids    Environmental and seasonal allergies    ESRD (end stage renal disease) on dialysis ([place])    M-W-F   Gastroparesis    GERD (gastroesophageal reflux disease)    Headache    History of anemia due to chronic kidney disease    History of hiatal hernia    HOH (hard of hearing)    Hx of pancreatitis 2015   Hypertension    Lower extremity edema    Mitral regurgitation    a. echo 10/2013: EF 62%, noWMA, mildly dilated LA, mild to mod MR/TR, GR1DD   Myocardial infarction ([place])    Orthopnea    Parathyroid abnormality ([place])    Peripheral arterial  disease ([place])    Pneumonia    Renal cancer ([place])    Renal insufficiency    Pt is on dialysis on M,W + F.   Wheezing     Patient Active Problem List   Diagnosis Date Noted   Debility 07/04/2022   Acute colitis 07/02/2022   Venous ulcer of ankle, right (HCC) 06/30/2022   Pericardial effusion 12/05/2021   Hearing impairment 12/04/2021   Hyponatremia 11/27/2021   COVID-19 virus infection 11/27/2021   Bacterial pneumonia 11/27/2021   Acute GI bleeding 11/15/2021   Upper GI bleed 11/15/2021   Anemia of chronic kidney failure 05/03/2021   Thrombocytopenia ([place]) 05/03/2021   Severe sepsis with septic shock ([place]) 04/25/2021   HCAP (healthcare-associated pneumonia) 03/21/1600   Acute metabolic encephalopathy 09/32/3557   UTI (urinary tract infection) 03/29/2021   Chronic respiratory failure with hypoxia ([place]) 12/18/2020   Superior vena caval syndrome 12/14/2020   Chronic diastolic CHF (congestive heart failure) ([place]) 12/04/2020   Bradycardia 09/17/2020   Advanced care planning/counseling discussion    Discitis of thoracic region    Fluid overload 08/10/2020   HLD (hyperlipidemia) 08/10/2020  Chest pain 08/10/2020   CAD (coronary artery disease) 08/10/2020   GERD (gastroesophageal reflux disease) 08/10/2020   Obesity (BMI 30-39.9) 06/06/2020   Anemia in ESRD (end-stage renal disease) ([place]) 05/31/2020   Type 1 diabetes mellitus ([place]) 05/31/2020   End-stage renal disease ([place]) 05/31/2020   Hypertensive disorder 05/31/2020   Arm pain 05/21/2020   Cellulitis of right leg 05/02/2020   ESRD (end stage renal disease) ([place]) 04/13/2020   SOBOE (shortness of breath on exertion) 02/08/2020   Hematoma of arm, left, subsequent encounter 11/24/2019   Respiratory failure (HCC)    Shortness of breath    Complication of arteriovenous dialysis fistula 11/13/2019   PVD (peripheral vascular disease) ([place]) 11/13/2019   Severe anemia 11/13/2019   Pressure ulcer, stage 2 ([place]) 11/13/2019    Hemorrhagic shock ([place]) 11/11/2019   Leg pain 10/02/2019   CAP (community acquired pneumonia) 10/01/2019   Atherosclerosis of native arteries of extremity with intermittent claudication ([place]) 05/01/2019   Right-sided headache    COPD (chronic obstructive pulmonary disease) ([place]) 10/06/2018   Syncope 10/04/2018   Symptomatic anemia 06/18/2018   Gastroparesis due to DM ([place]) 62/70/3500   Complication of vascular access for dialysis 12/04/2017   Osteomyelitis ([place]) 09/30/2017   Carotid stenosis 06/18/2017   Bilateral carotid artery stenosis 06/16/2017   Benign essential HTN 05/19/2017   CKD (chronic kidney disease) stage 5, GFR less than 15 ml/min ([place]) 05/19/2017   Hyperlipidemia, mixed 05/19/2017   Shortness of breath 05/04/2017   Rash, skin 12/16/2016   Cellulitis of lower extremity 07/29/2016   Chronic venous insufficiency 07/29/2016   Lymphedema 07/29/2016   TIA (transient ischemic attack) 04/21/2016   Altered mental status 04/08/2016   Hyperammonemia ([place]) 04/08/2016   Elevated troponin 04/08/2016   Depression 04/08/2016   Depression, major, recurrent, severe with psychosis ([name]) 04/08/2016   Blood in stool    Intractable cyclical vomiting with nausea    Reflux esophagitis    Gastritis    Generalized abdominal pain    Uncontrollable vomiting    Major depressive disorder, recurrent episode, moderate ([place]) 03/15/2016   Adjustment disorder with mixed anxiety and depressed mood 03/15/2016   Malnutrition of moderate degree 12/01/2015   Renal mass    Dyspnea    Acute renal failure ([place])    Respiratory failure ([place])    Fever 11/14/2015   Encounter for central line placement    Encounter for orogastric (OG) tube placement    Nausea 11/12/2015   Hyperkalemia 10/03/2015   Diarrhea, unspecified 07/22/2015   Hypoglycemia 04/24/2015   Unresponsiveness 04/24/2015   Sinus bradycardia 04/24/2015   Hypothermia 04/24/2015   Acute hypoxemic respiratory failure ([place]) 04/24/2015    Acute on chronic diastolic CHF (congestive heart failure) ([place]) 04/05/2015   Diabetic gastroparesis ([place]) 04/05/2015   Hypokalemia 04/05/2015   Physical debility 04/05/2015   Acute pulmonary edema ([place]) 04/03/2015   Nausea and vomiting 04/03/2015   Hypoglycemia associated with diabetes ([place]) 04/03/2015   Anemia of chronic disease 04/03/2015   Secondary hyperparathyroidism ([place]) 04/03/2015   Pressure ulcer 04/02/2015   Acute respiratory failure with hypoxia ([place]) 04/01/2015   Adjustment disorder with anxiety 03/14/2015   Somatic symptom disorder, mild 03/08/2015   Coronary artery disease involving native coronary artery of native heart without angina pectoris    Nausea & vomiting 03/06/2015   Abdominal pain 03/06/2015   Hypoglycemia in type 2 diabetes mellitus , without long-term current use of insulin ([place]) 03/06/2015   HTN (hypertension) 03/06/2015   Gastroparesis 02/24/2015  Pleural effusion 02/19/2015   Bacteremia due to Enterococcus 02/19/2015   End-stage renal disease on hemodialysis ([place]) 02/19/2015   Diarrhea 08/01/2014     Physical Exam  Triage Vital Signs: ED Triage Vitals  Enc Vitals Group     BP 09/03/22 1249 (!) 177/70     Pulse Rate 09/03/22 1249 62     Resp 09/03/22 1249 18     Temp 09/03/22 1249 98.2 F (36.8 C)     Temp Source 09/03/22 1249 Oral     SpO2 09/03/22 1249 98 %     Weight 09/03/22 1247 174 lb 9.7 oz (79.2 kg)     Height 09/03/22 1247 '5\' 5"'$  (1.651 m)     Head Circumference --      Peak Flow --      Pain Score 09/03/22 1247 10     Pain Loc --      Pain Edu? --      Excl. in [place]? --     Most recent vital signs: Vitals:   09/03/22 1249  BP: (!) 177/70  Pulse: 62  Resp: 18  Temp: 98.2 F (36.8 C)  SpO2: 98%     General: Awake, no distress.  CV:  Good peripheral perfusion.  Resp:  Normal effort.  Abd:  No distention.  Abdomen is soft but is diffusely tender Neuro:             Awake, Alert, Oriented x 3  Other:     ED Results /  Procedures / Treatments  Labs (all labs ordered are listed, but only abnormal results are displayed) Labs Reviewed  COMPREHENSIVE METABOLIC PANEL - Abnormal; Notable for the following components:      Result Value   Potassium 3.1 (*)    Chloride 97 (*)    BUN 7 (*)    Creatinine, Ser 4.02 (*)    Calcium 7.8 (*)    Albumin 2.7 (*)    GFR, Estimated 12 (*)    All other components within normal limits  CBC - Abnormal; Notable for the following components:   RBC 3.44 (*)    Hemoglobin 8.9 (*)    HCT 28.0 (*)    MCH 25.9 (*)    RDW 20.7 (*)    Platelets 110 (*)    All other components within normal limits  LIPASE, BLOOD  URINALYSIS, ROUTINE W REFLEX MICROSCOPIC     EKG     RADIOLOGY    PROCEDURES:  Critical Care performed: No  Procedures  The patient is on the cardiac monitor to evaluate for evidence of arrhythmia and/or significant heart rate changes.   MEDICATIONS ORDERED IN ED: Medications  morphine (PF) 4 MG/ML injection 4 mg (has no administration in time range)  ondansetron (ZOFRAN) injection 4 mg (has no administration in time range)     IMPRESSION / MDM / ASSESSMENT AND PLAN / ED COURSE  I reviewed the triage vital signs and the nursing notes.                              Patient's presentation is most consistent with acute presentation with potential threat to life or bodily function.  Differential diagnosis includes, but is not limited to, gastroenteritis biliary colic pancreatitis bowel obstruction volvulus  The patient is a 61 year old female presenting with abdominal pain for the last 2 days.  It is diffuse associate with nausea vomiting.  Has had normal bowel movement today.  Patient has history of end-stage renal disease was dialyzed yesterday.  Patient resting comfortably on my evaluation but she has diffuse abdominal tenderness.  She is hypertensive vitals otherwise reassuring.  Labs overall reassuring as well.  Potassium 3.1 but given  end-stage renal would not supplement.  No leukocytosis hemoglobin is at her baseline.  Lipase is normal.  Given the significant tenderness on exam will obtain CT abdomen and pelvis.  Patient pending imaging at the time of signout.   Clinical Course as of 09/03/22 1524  Wed Sep 03, 2022  1520 ESRD, HD yesterday.  Upper abd pain with n/v - f/u CT scan [SM]    Clinical Course User Index [SM] Nathaniel Man, MD     FINAL CLINICAL IMPRESSION(S) / ED DIAGNOSES   Final diagnoses:  Generalized abdominal pain     Rx / DC Orders   ED Discharge Orders     None        Note:  This document was prepared using Dragon voice recognition software and may include unintentional dictation errors.   Rada Hay, MD 09/03/22 1524

## 2022-09-03 NOTE — ED Notes (Signed)
MD aware of pts BP, no intervention at this time.

## 2022-09-03 NOTE — ED Notes (Signed)
First Nurse note: Pt here via [facility] from home with c/o of umbilical ABD pain. Pain started yesterday, EMS responded yesterday but pt refused transport.   199/76 100% 2L/min, chronic use 98  CBG 93

## 2022-09-03 NOTE — Assessment & Plan Note (Addendum)
Continue Neurontin, stopped after comfort care.

## 2022-09-04 ENCOUNTER — Encounter: Payer: Self-pay | Admitting: Family Medicine

## 2022-09-04 DIAGNOSIS — Z992 Dependence on renal dialysis: Secondary | ICD-10-CM | POA: Diagnosis not present

## 2022-09-04 DIAGNOSIS — E10649 Type 1 diabetes mellitus with hypoglycemia without coma: Secondary | ICD-10-CM | POA: Diagnosis not present

## 2022-09-04 DIAGNOSIS — N186 End stage renal disease: Secondary | ICD-10-CM | POA: Diagnosis not present

## 2022-09-04 DIAGNOSIS — R112 Nausea with vomiting, unspecified: Secondary | ICD-10-CM | POA: Diagnosis not present

## 2022-09-04 DIAGNOSIS — K8681 Exocrine pancreatic insufficiency: Secondary | ICD-10-CM | POA: Diagnosis present

## 2022-09-04 DIAGNOSIS — A498 Other bacterial infections of unspecified site: Secondary | ICD-10-CM | POA: Diagnosis present

## 2022-09-04 LAB — BASIC METABOLIC PANEL
Anion gap: 7 (ref 5–15)
BUN: 7 mg/dL — ABNORMAL LOW (ref 8–23)
CO2: 27 mmol/L (ref 22–32)
Calcium: 7.4 mg/dL — ABNORMAL LOW (ref 8.9–10.3)
Chloride: 99 mmol/L (ref 98–111)
Creatinine, Ser: 4.25 mg/dL — ABNORMAL HIGH (ref 0.44–1.00)
GFR, Estimated: 11 mL/min — ABNORMAL LOW (ref >60)
Glucose, Bld: 190 mg/dL — ABNORMAL HIGH (ref 70–99)
Potassium: 4.3 mmol/L (ref 3.5–5.1)
Sodium: 133 mmol/L — ABNORMAL LOW (ref 135–145)

## 2022-09-04 LAB — CBC
HCT: 27.7 % — ABNORMAL LOW (ref 36.0–46.0)
Hemoglobin: 9.2 g/dL — ABNORMAL LOW (ref 12.0–15.0)
MCH: 26.2 pg (ref 26.0–34.0)
MCHC: 33.2 g/dL (ref 30.0–36.0)
MCV: 78.9 fL — ABNORMAL LOW (ref 80.0–100.0)
Platelets: 115 10*3/uL — ABNORMAL LOW (ref 150–400)
RBC: 3.51 MIL/uL — ABNORMAL LOW (ref 3.87–5.11)
RDW: 20.5 % — ABNORMAL HIGH (ref 11.5–15.5)
WBC: 4.6 10*3/uL (ref 4.0–10.5)
nRBC: 0 % (ref 0.0–0.2)

## 2022-09-04 LAB — GLUCOSE, CAPILLARY
Glucose-Capillary: 170 mg/dL — ABNORMAL HIGH (ref 70–99)
Glucose-Capillary: 154 mg/dL — ABNORMAL HIGH (ref 70–99)
Glucose-Capillary: 126 mg/dL — ABNORMAL HIGH (ref 70–99)

## 2022-09-04 MED ORDER — ANTICOAGULANT SODIUM CITRATE 4% (200MG/5ML) IV SOLN: 5.0000 mL | Status: DC | PRN

## 2022-09-04 MED ORDER — SODIUM CHLORIDE 0.9 % IV SOLN
12.5000 mg | Freq: Once | INTRAVENOUS | Status: AC
  Administered 2022-09-06: 12.5 mg via INTRAVENOUS
  Filled 2022-09-04: qty 12.5

## 2022-09-04 MED ORDER — HYDRALAZINE HCL 20 MG/ML IJ SOLN: 10.0000 mg | INTRAMUSCULAR | Status: DC | PRN

## 2022-09-04 MED ORDER — HEPARIN SODIUM (PORCINE) 1000 UNIT/ML DIALYSIS: 1000.0000 [IU] | INTRAMUSCULAR | Status: DC | PRN

## 2022-09-04 MED ORDER — CHLORHEXIDINE GLUCONATE CLOTH 2 % EX PADS
6.0000 | MEDICATED_PAD | Freq: Every day | CUTANEOUS | Status: DC
  Administered 2022-09-04 – 2022-09-12 (×8): 6 via TOPICAL

## 2022-09-04 MED ORDER — LIDOCAINE HCL (PF) 1 % IJ SOLN: 5.0000 mL | INTRAMUSCULAR | Status: DC | PRN

## 2022-09-04 MED ORDER — LIDOCAINE-PRILOCAINE 2.5-2.5 % EX CREA: 1.0000 | TOPICAL_CREAM | CUTANEOUS | Status: DC | PRN

## 2022-09-04 MED ORDER — PENTAFLUOROPROP-TETRAFLUOROETH EX AERO: 1.0000 | INHALATION_SPRAY | CUTANEOUS | Status: DC | PRN

## 2022-09-04 MED ORDER — ALTEPLASE 2 MG IJ SOLR: 2.0000 mg | Freq: Once | INTRAMUSCULAR | Status: DC | PRN

## 2022-09-04 NOTE — Assessment & Plan Note (Addendum)
Not likely active infection.  She is followed by GI for recurrent C diff and had repeat stool studies on 12/3 which were antigen positive, toxin negative, but PCR positive. Pt has intractable N/V but has denied recent diarrhea.  No treatment indicated at this time.

## 2022-09-04 NOTE — Progress Notes (Signed)
[facility]  ROUNDING NOTE   Subjective:   [name] is a 61 y.o., female with past medical history of anemia, asthma, diabetes, CAD, PAD, GERD, and end stage renal disease on dialysis.  Patient arrived to emergency department via EMS with complaints of abdominal pain.  Patient has been admitted for Generalized abdominal pain [R10.84] Intractable nausea and vomiting [R11.2] Gastritis without bleeding, unspecified chronicity, unspecified gastritis type [K29.70]  Patient is known to our practice and receives outpatient dialysis treatments at [facility] on a TTS schedule, supervised by Dr. Holley Raring.  Reports no recent missed treatments.  Patient reports nausea and vomiting that started the evening prior to ED arrival. States she began having upper abdominal pain during that time also. Denies known fever or chills. Denies sick contacts. States she feels bad today. Continues to have nausea  Labs on ED arrival significant for potassium 3.1, BUN 7, creatinine 4.02 with GFR 12.  Hemoglobin 8.9.CT abdomen pelvis shows small volume ascites with small bilateral pleural effusions.  Suspicious for gastritis.  We have been consulted to continue dialysis needs during this admission.  Objective:  Vital signs in last 24 hours:  Temp:  [97.4 F (36.3 C)-98.1 F (36.7 C)] 98.1 F (36.7 C) (12/14 1327) Pulse Rate:  [51-73] 61 (12/14 1332) Resp:  [8-20] 10 (12/14 1332) BP: (144-192)/(55-93) 176/60 (12/14 1332) SpO2:  [100 %] 100 % (12/14 1332) Weight:  [76.4 kg-80.1 kg] 76.4 kg (12/14 1332)  Weight change:  Filed Weights   09/03/22 1247 09/04/22 0926 09/04/22 1332  Weight: 79.2 kg 80.1 kg 76.4 kg    Intake/Output: I/O last 3 completed shifts: In: 576.9 [I.V.:576.9] Out: -    Intake/Output this shift:  Total I/O In: -  Out: 1000 [Other:1000]  Physical Exam: General: Ill-appearing  Head: Normocephalic, atraumatic. Moist oral mucosal membranes  Eyes: Anicteric  Lungs:   Clear to auscultation. Normal effort, [place] O2  Heart: Regular rate and rhythm  Abdomen:  Soft, nontender  Extremities:  trace peripheral edema.  Neurologic: Nonfocal, moving all four extremities  Skin: No lesions, dry  Access: Rt permcath    Basic Metabolic Panel: Recent Labs  Lab 09/03/22 1313 09/04/22 0232  NA 136 133*  K 3.1* 4.3  CL 97* 99  CO2 32 27  GLUCOSE 84 190*  BUN 7* 7*  CREATININE 4.02* 4.25*  CALCIUM 7.8* 7.4*  MG 1.9  --      Liver Function Tests: Recent Labs  Lab 09/03/22 1313  AST 25  ALT 9  ALKPHOS 76  BILITOT 0.6  PROT 6.5  ALBUMIN 2.7*    Recent Labs  Lab 09/03/22 1313  LIPASE 26    No results for input(s): "AMMONIA" in the last 168 hours.  CBC: Recent Labs  Lab 09/03/22 1313 09/04/22 0408  WBC 4.1 4.6  HGB 8.9* 9.2*  HCT 28.0* 27.7*  MCV 81.4 78.9*  PLT 110* 115*     Cardiac Enzymes: No results for input(s): "CKTOTAL", "CKMB", "CKMBINDEX", "TROPONINI" in the last 168 hours.   BNP: Invalid input(s): "POCBNP"  CBG: Recent Labs  Lab 09/03/22 1917 09/03/22 1949 09/03/22 2051 09/03/22 2225 09/04/22 0731  GLUCAP 50* 61* 101* 173* 170*     Microbiology: Results for orders placed or performed during the hospital encounter of 08/24/22  Gastrointestinal Panel by PCR , Stool     Status: None   Collection Time: 08/24/22  4:20 PM   Specimen: Stool  Result Value Ref Range Status   Campylobacter species  NOT DETECTED NOT DETECTED Final   Plesimonas shigelloides NOT DETECTED NOT DETECTED Final   Salmonella species NOT DETECTED NOT DETECTED Final   Yersinia enterocolitica NOT DETECTED NOT DETECTED Final   Vibrio species NOT DETECTED NOT DETECTED Final   Vibrio cholerae NOT DETECTED NOT DETECTED Final   Enteroaggregative E coli (EAEC) NOT DETECTED NOT DETECTED Final   Enteropathogenic E coli (EPEC) NOT DETECTED NOT DETECTED Final   Enterotoxigenic E coli (ETEC) NOT DETECTED NOT DETECTED Final   Shiga like toxin producing E  coli (STEC) NOT DETECTED NOT DETECTED Final   Shigella/Enteroinvasive E coli (EIEC) NOT DETECTED NOT DETECTED Final   Cryptosporidium NOT DETECTED NOT DETECTED Final   Cyclospora cayetanensis NOT DETECTED NOT DETECTED Final   Entamoeba histolytica NOT DETECTED NOT DETECTED Final   Giardia lamblia NOT DETECTED NOT DETECTED Final   Adenovirus F40/41 NOT DETECTED NOT DETECTED Final   Astrovirus NOT DETECTED NOT DETECTED Final   Norovirus GI/GII NOT DETECTED NOT DETECTED Final   Rotavirus A NOT DETECTED NOT DETECTED Final   Sapovirus (I, II, IV, and V) NOT DETECTED NOT DETECTED Final    Comment: Performed at [facility], [place], [place] [address]  C Difficile Quick Screen w PCR reflex     Status: Abnormal   Collection Time: 08/24/22  4:20 PM   Specimen: STOOL  Result Value Ref Range Status   C Diff antigen POSITIVE (A) NEGATIVE Final   C Diff toxin NEGATIVE NEGATIVE Final   C Diff interpretation Results are indeterminate. See PCR results.  Final    Comment: Performed at [facility], [address] [place], [place] [address]  C. Diff by PCR, Reflexed     Status: Abnormal   Collection Time: 08/24/22  4:20 PM  Result Value Ref Range Status   Toxigenic C. Difficile by PCR POSITIVE (A) NEGATIVE Final    Comment: Positive for toxigenic C. difficile with little to no toxin production. Only treat if clinical presentation suggests symptomatic illness. Performed at [facility], [address] [place], [place] [address]     Coagulation Studies: No results for input(s): "LABPROT", "INR" in the last 72 hours.   Urinalysis: No results for input(s): "COLORURINE", "LABSPEC", "PHURINE", "GLUCOSEU", "HGBUR", "BILIRUBINUR", "KETONESUR", "PROTEINUR", "UROBILINOGEN", "NITRITE", "LEUKOCYTESUR" in the last 72 hours.  Invalid input(s): "APPERANCEUR"    Imaging: CT ABDOMEN PELVIS W CONTRAST  Result Date: 09/03/2022 CLINICAL DATA:  Umbilical abdominal  pain.  Patient on dialysis. EXAM: CT ABDOMEN AND PELVIS WITH CONTRAST TECHNIQUE: Multidetector CT imaging of the abdomen and pelvis was performed using the standard protocol following bolus administration of intravenous contrast. RADIATION DOSE REDUCTION: This exam was performed according to the departmental dose-optimization program which includes automated exposure control, adjustment of the mA and/or kV according to patient size and/or use of iterative reconstruction technique. CONTRAST:  1110m OMNIPAQUE IOHEXOL 300 MG/ML  SOLN COMPARISON:  07/01/2022 FINDINGS: Lower chest: Progressive septal thickening at the lung bases. Areas of peribronchovascular ground-glass. Moderate cardiomegaly. Right coronary artery calcification. Incompletely imaged central line. Small bilateral pleural effusions. Hepatobiliary: Normal liver. Cholecystectomy. No biliary duct dilatation. Pancreas: Pancreatic atrophy.  No duct dilatation. Spleen: Normal in size, without focal abnormality. Adrenals/Urinary Tract: Normal adrenal glands. Bilateral native renal marked atrophy. Interpolar left renal 1.7 cm cyst or minimally complex cyst . In the absence of clinically indicated signs/symptoms require(s) no independent follow-up. No hydronephrosis. The bladder is moderately thick walled including on 72/3. Stomach/Bowel: The stomach is incompletely distended. Again  identified is apparent wall thickening including within the antrum on 34/3. Extensive colonic diverticulosis. Normal terminal ileum. Appendix not visualized. Normal small bowel. Vascular/Lymphatic: Markedly age advanced aortic and branch vessel atherosclerosis. Index left abdominal retroperitoneal 1.1 cm node on 30/3 similar to 1.2 cm on the prior. Gastrohepatic ligament 11 mm node on 19/3 is relatively similar to on the prior. Left inguinal index node measures 1.2 cm on 79/3 versus 1.4 cm on the prior. Left external iliac 1.4 cm node on 68/3 measured 1.5 cm previously. Reproductive:  Hysterectomy. Other: No free intraperitoneal air. Small volume abdominopelvic ascites is similar. Diffuse anasarca. Musculoskeletal: Renal osteodystrophy. IMPRESSION: 1.  No acute process in the abdomen or pelvis. 2. Fluid overload, as evidenced by similar anasarca, small volume abdominopelvic ascites, and small bilateral pleural effusions. Worsened bibasilar aeration, likely related to increased interstitial and alveolar edema. 3. Apparent gastric wall thickening which could be partially due to underdistention. Correlate with symptoms of gastritis. 4. Bladder wall thickening, suspicious for cystitis. 5. Similar and mildly improved abdominopelvic adenopathy, favoring a reactive etiology. As before, laboratory and clinical correlation for lymphoproliferative process suggested. 6. Coronary artery atherosclerosis. Aortic Atherosclerosis (ICD10-I70.0). Electronically Signed   By: Abigail Miyamoto M.D.   On: 09/03/2022 16:15     Medications:    anticoagulant sodium citrate     dextrose 100 mL/hr at 09/04/22 0540   promethazine (PHENERGAN) injection (IM or IVPB) Stopped (09/03/22 2110)   promethazine (PHENERGAN) injection (IM or IVPB)       acidophilus  1 capsule Oral Daily   amLODipine  10 mg Oral Daily   Chlorhexidine Gluconate Cloth  6 each Topical Daily   cholecalciferol  1,000 Units Oral Daily   cyanocobalamin  1,000 mcg Oral Daily   feeding supplement (NEPRO CARB STEADY)  237 mL Oral BID BM   ferrous sulfate  325 mg Oral Daily   fluticasone  2 spray Each Nare Daily   furosemide  80 mg Oral Daily   heparin injection (subcutaneous)  5,000 Units Subcutaneous Q8H   hydrALAZINE  50 mg Oral BID   HYDROcodone-acetaminophen  1 tablet Oral Q8H   insulin aspart  0-5 Units Subcutaneous QHS   insulin aspart  0-9 Units Subcutaneous TID WC   lipase/protease/amylase  72,000 Units Oral TID WC   mirtazapine  7.5 mg Oral QHS   multivitamin  1 tablet Oral Daily   ondansetron (ZOFRAN) IV  4 mg Intravenous Once    pantoprazole (PROTONIX) IV  40 mg Intravenous Q12H   sevelamer carbonate  800 mg Oral TID WC   acetaminophen **OR** acetaminophen, albuterol, alteplase, alum & mag hydroxide-simeth, anticoagulant sodium citrate, heparin, hydrALAZINE, hydrOXYzine, lidocaine (PF), lidocaine-prilocaine, lipase/protease/amylase **AND** lipase/protease/amylase, meclizine, pentafluoroprop-tetrafluoroeth, polyethylene glycol, promethazine, sevelamer carbonate **AND** sevelamer carbonate, traZODone  Assessment/ Plan:  [name] is a 61 y.o.  female with past medical history of anemia, asthma, diabetes, CAD, PAD, GERD, and end stage renal disease on dialysis.  Patient arrived to emergency department via EMS after being found at home by grandson with multiple space heaters deterrent to high setting and patient wrapped in heated blanket.  Patient has been admitted for Generalized abdominal pain [R10.84] Intractable nausea and vomiting [R11.2] Gastritis without bleeding, unspecified chronicity, unspecified gastritis type [K29.70]  [facility]/TTS/Rt Permcath  End-stage renal disease on hemodialysis.     .    Scheduled to receive dialysis today, UF goal 1 L as tolerated.  Next treatment scheduled for Saturday.  2. Anemia of chronic kidney  disease Lab Results  Component Value Date   HGB 9.2 (L) 09/04/2022    Hemoglobin remains at goal.  Patient receives [place] outpatient.  3. Secondary Hyperparathyroidism: with outpatient labs: PTH 485, phosphorus 3.9, calcium 8.5 on 06/03/22.   Lab Results  Component Value Date   PTH 357 (H) 10/05/2018   CALCIUM 7.4 (L) 09/04/2022   CAION 1.09 (L) 04/13/2020   PHOS 2.3 (L) 07/08/2022   Will continue to monitor bone minerals.  Continue Sevelamer with meals.   4. Diabetes mellitus type II with chronic kidney disease/renal manifestations:noninsulin dependent.   Primary team to manage sliding scale insulin.  5.  Hypertension with chronic kidney disease.      Currently prescribed amlodipine, furosemide, and hydralazine.     LOS: 0   12/14/20231:42 PM

## 2022-09-04 NOTE — Progress Notes (Signed)
Tx completed and tolerated well.  Post b/p stable at 176/60.  Removed 1.0Kg of fluid and processed 73.6L of blood.  No distress noted and no c/o by pt.  Report given to floor nurse. Left unit via transport

## 2022-09-04 NOTE — Progress Notes (Signed)
Progress Note   Patient: [name]:[id number] DOB: Sep 07, 1961 DOA: 09/03/2022     0 DOS: the patient was seen and examined on 09/04/2022   Brief hospital course: [name] is a 61 y.o. African-American female with medical history significant for end-stage renal disease on hemodialysis on MWF, asthma, osteoarthritis, chronic diastolic CHF, COPD, coronary artery disease, type 2 diabetes mellitus, gastroparesis, GERD, diverticulosis and peripheral neuropathy, who presented to the ER 09/03/2022 for evaluation of intractable nausea and vomiting since evening before.  No bilious vomitus or hematemesis.  She admits to epigastric abdominal pain and denies any diarrhea.   CT abdomen/pelvis in the ED was without acute findings. It showed anasarca and small bilateral pleural effusions.  Gastric wall thickening which could indicate gastritis (vs chronic).  Abdominopelvic adenopathy mildly improved from prior, suspected reactive.  Admitted to the hospital for further evaluation and management.  Started on supportive care with IV antiemetics, IV Protonix.  Clear liquid diet to allow GI tract to rest.    Nephrology consulted for dialysis.  Assessment and Plan: * Intractable nausea and vomiting Likely Acute gastritis that could be viral. Continue supportive care with IV antiemetics, IV PPI.  Clear liquid diet, advance as tolerated. Will get GI input if not improving in 24-48 hours as expected.  Type 1 diabetes mellitus with hypoglycemia without coma, without long-term current use of insulin (HCC) Hypoglycemia due to N/V and lack of PO intake. --Hypoglycemic protocol --on D10W at 100 cc /hr --CBG's this afternoon in upper 100's --reduce D10 to 50 cc/hr --can likely stop that overnight or tomorrow if persistent hyperglycemia --Hold off insulin for now  Essential hypertension Continue her antihypertensives. PRN IV hydralazine ordered.  End-stage renal disease on hemodialysis [facility]) Nephrology  consulted for dialysis. Dialyzed today 12/14.  Dyslipidemia Continue statin  Peripheral neuropathy Continue Neurontin.  Exocrine pancreatic insufficiency Follows with GI. Continue Creon  Recurrent Clostridioides difficile infection Not likely active infection.  She is followed by GI for recurrent C diff and had repeat stool studies on 12/3 which were antigen positive, toxin negative, but PCR positive. Pt has intractable N/V but has denied recent diarrhea. Monitor.        Subjective: Pt seen in dialysis today.  She reports ongoing N/V, feels terrible.  She would not really engage in conversation or answer questions, would shake/nod head or gesture to answer questions. Says it's been 3 days since symptoms started.  No diarrhea.  Endorses abdominal pain.  Wants to come off dialysis machine.   Physical Exam: Vitals:   09/04/22 1300 09/04/22 1327 09/04/22 1332 09/04/22 1351  BP: (!) 172/67 (!) 157/88 (!) 176/60 (!) 155/74  Pulse: (!) 58 60 61 62  Resp: (!) '9 10 10 18  '$ Temp:  98.1 F (36.7 C)  98.3 F (36.8 C)  TempSrc:  Oral    SpO2: 100% 100% 100% 100%  Weight:   76.4 kg   Height:       General exam: awake, appears drowsy, no acute distress, chronically ill-appearing, holding emesis bag HEENT: moist mucus membranes, hearing grossly normal  Respiratory system: CTAB diminished bases, no wheezes, rales or rhonchi, normal respiratory effort. Cardiovascular system: normal S1/S2, RRR.   Gastrointestinal system: soft, epigastric tenderness without rebound, some voluntary guarding Central nervous system: no gross focal neurologic deficits, normal speech Extremities: BLE venous stasis and skin wrinkling consistent with prior edema Skin: dry, intact, normal temperature Psychiatry: normal mood, flat affect, unable to assess judgement and insight due to minimal  interaction   Data Reviewed:  Notable labs ---  Na 133, glucose 190, BUN 7, Cr 4.25, Ca 7.4, GFR 11, Hbg 9.2 from 8.9,  platelets 115k from 110k  C diff antigen positive, PCR positive, toxin negative.  Family Communication: None  Disposition: Status is: Observation The patient remains OBS appropriate and will d/c before 2 midnights.    Planned Discharge Destination: Home    Time spent: 45 minutes  Author: Ezekiel Slocumb, DO 09/04/2022 4:34 PM  For on call review [email or web address].

## 2022-09-04 NOTE — Assessment & Plan Note (Addendum)
Follows with GI.  Initially on Creon, discontinued after comfort care

## 2022-09-04 NOTE — Care Management Obs Status (Signed)
[facility] NOTIFICATION   Patient Details  Name: [name] MRN: [id number] Date of Birth: 03/30/1961   Medicare Observation Status Notification Given:  Yes    Conception Oms, RN 09/04/2022, 1:57 PM

## 2022-09-04 NOTE — Discharge Planning (Signed)
[facility]  [address], [place] [address] [phone]  Schedule Days: Tuesday Thursday and Saturday   Treatment Time: 6:45am

## 2022-09-04 NOTE — Hospital Course (Addendum)
[name] is a 61 y.o. female with medical history significant for end-stage renal disease on hemodialysis on MWF, asthma, osteoarthritis, chronic diastolic CHF, COPD, coronary artery disease, type 2 diabetes mellitus, gastroparesis, GERD, diverticulosis and peripheral neuropathy, who presented to the ER 09/03/2022 for evaluation of intractable nausea and vomiting since evening before.  No bilious vomitus or hematemesis.  She admits to epigastric abdominal pain and denies any diarrhea.  Gastroenterology consulted for persistent N/V and several chronic GI issues with planned outpatient endoscopies scheduled.  Patient appears to have improvement with scheduled Phenergan and Reglan.  Palliative care has been following patient and after extensive discussions, patient states that she is tired of having dialysis.  She has mentioned this before to family and even to the staff at the dialysis center, but they seem to dismiss her wishes.  She states that she has concerns about her children keeping her going should she stop treatments.  After extensive discussion patient has opted for living will which prevents deviating from her wishes so that she can stop her dialysis.  Patient transitioned to comfort care with plans to transition to hospice facility.

## 2022-09-05 ENCOUNTER — Ambulatory Visit: Admission: RE | Admit: 2022-09-05 | Payer: Medicare Other | Source: Home / Self Care

## 2022-09-05 ENCOUNTER — Encounter
Admission: EM | Disposition: A | Discharge status: Hospice | Payer: Self-pay | Source: Home / Self Care | Attending: Internal Medicine

## 2022-09-05 DIAGNOSIS — E10649 Type 1 diabetes mellitus with hypoglycemia without coma: Secondary | ICD-10-CM | POA: Diagnosis present

## 2022-09-05 DIAGNOSIS — Z515 Encounter for palliative care: Secondary | ICD-10-CM | POA: Diagnosis not present

## 2022-09-05 DIAGNOSIS — D631 Anemia in chronic kidney disease: Secondary | ICD-10-CM | POA: Diagnosis present

## 2022-09-05 DIAGNOSIS — I5032 Chronic diastolic (congestive) heart failure: Secondary | ICD-10-CM | POA: Diagnosis present

## 2022-09-05 DIAGNOSIS — K3184 Gastroparesis: Secondary | ICD-10-CM | POA: Diagnosis present

## 2022-09-05 DIAGNOSIS — K2970 Gastritis, unspecified, without bleeding: Secondary | ICD-10-CM | POA: Diagnosis not present

## 2022-09-05 DIAGNOSIS — R188 Other ascites: Secondary | ICD-10-CM | POA: Diagnosis present

## 2022-09-05 DIAGNOSIS — K861 Other chronic pancreatitis: Secondary | ICD-10-CM | POA: Diagnosis present

## 2022-09-05 DIAGNOSIS — R112 Nausea with vomiting, unspecified: Secondary | ICD-10-CM | POA: Diagnosis not present

## 2022-09-05 DIAGNOSIS — F419 Anxiety disorder, unspecified: Secondary | ICD-10-CM | POA: Diagnosis present

## 2022-09-05 DIAGNOSIS — I2510 Atherosclerotic heart disease of native coronary artery without angina pectoris: Secondary | ICD-10-CM | POA: Diagnosis present

## 2022-09-05 DIAGNOSIS — E785 Hyperlipidemia, unspecified: Secondary | ICD-10-CM | POA: Diagnosis present

## 2022-09-05 DIAGNOSIS — Z992 Dependence on renal dialysis: Secondary | ICD-10-CM | POA: Diagnosis not present

## 2022-09-05 DIAGNOSIS — R1084 Generalized abdominal pain: Secondary | ICD-10-CM | POA: Diagnosis present

## 2022-09-05 DIAGNOSIS — E871 Hypo-osmolality and hyponatremia: Secondary | ICD-10-CM | POA: Diagnosis not present

## 2022-09-05 DIAGNOSIS — E1043 Type 1 diabetes mellitus with diabetic autonomic (poly)neuropathy: Secondary | ICD-10-CM | POA: Diagnosis present

## 2022-09-05 DIAGNOSIS — Z794 Long term (current) use of insulin: Secondary | ICD-10-CM | POA: Diagnosis not present

## 2022-09-05 DIAGNOSIS — Z7189 Other specified counseling: Secondary | ICD-10-CM | POA: Diagnosis not present

## 2022-09-05 DIAGNOSIS — N186 End stage renal disease: Secondary | ICD-10-CM | POA: Diagnosis present

## 2022-09-05 DIAGNOSIS — N2581 Secondary hyperparathyroidism of renal origin: Secondary | ICD-10-CM | POA: Diagnosis present

## 2022-09-05 DIAGNOSIS — E1143 Type 2 diabetes mellitus with diabetic autonomic (poly)neuropathy: Secondary | ICD-10-CM | POA: Diagnosis not present

## 2022-09-05 DIAGNOSIS — E876 Hypokalemia: Secondary | ICD-10-CM | POA: Diagnosis present

## 2022-09-05 DIAGNOSIS — E1022 Type 1 diabetes mellitus with diabetic chronic kidney disease: Secondary | ICD-10-CM | POA: Diagnosis present

## 2022-09-05 DIAGNOSIS — E1051 Type 1 diabetes mellitus with diabetic peripheral angiopathy without gangrene: Secondary | ICD-10-CM | POA: Diagnosis present

## 2022-09-05 DIAGNOSIS — Z66 Do not resuscitate: Secondary | ICD-10-CM | POA: Diagnosis present

## 2022-09-05 DIAGNOSIS — A498 Other bacterial infections of unspecified site: Secondary | ICD-10-CM

## 2022-09-05 DIAGNOSIS — Z85528 Personal history of other malignant neoplasm of kidney: Secondary | ICD-10-CM | POA: Diagnosis not present

## 2022-09-05 DIAGNOSIS — I132 Hypertensive heart and chronic kidney disease with heart failure and with stage 5 chronic kidney disease, or end stage renal disease: Secondary | ICD-10-CM | POA: Diagnosis present

## 2022-09-05 DIAGNOSIS — Z888 Allergy status to other drugs, medicaments and biological substances status: Secondary | ICD-10-CM | POA: Diagnosis not present

## 2022-09-05 LAB — HEMOGLOBIN A1C
Hgb A1c MFr Bld: <4.2 % — ABNORMAL LOW (ref 4.8–5.6)
Mean Plasma Glucose: <74 mg/dL

## 2022-09-05 LAB — GLUCOSE, CAPILLARY
Glucose-Capillary: 105 mg/dL — ABNORMAL HIGH (ref 70–99)
Glucose-Capillary: 105 mg/dL — ABNORMAL HIGH (ref 70–99)
Glucose-Capillary: 59 mg/dL — ABNORMAL LOW (ref 70–99)
Glucose-Capillary: 88 mg/dL (ref 70–99)

## 2022-09-05 SURGERY — COLONOSCOPY WITH PROPOFOL
Anesthesia: General

## 2022-09-05 MED ORDER — ORAL CARE MOUTH RINSE: 15.0000 mL | OROMUCOSAL | Status: DC | PRN

## 2022-09-05 NOTE — Progress Notes (Addendum)
Progress Note   Patient: [name] DOB: Jul 06, 1961 DOA: 09/03/2022     0 DOS: the patient was seen and examined on 09/05/2022   Brief hospital course: [name] is a 61 y.o. African-American female with medical history significant for end-stage renal disease on hemodialysis on MWF, asthma, osteoarthritis, chronic diastolic CHF, COPD, coronary artery disease, type 2 diabetes mellitus, gastroparesis, GERD, diverticulosis and peripheral neuropathy, who presented to the ER 09/03/2022 for evaluation of intractable nausea and vomiting since evening before.  No bilious vomitus or hematemesis.  She admits to epigastric abdominal pain and denies any diarrhea.   CT abdomen/pelvis in the ED was without acute findings. It showed anasarca and small bilateral pleural effusions.  Gastric wall thickening which could indicate gastritis (vs chronic).  Abdominopelvic adenopathy mildly improved from prior, suspected reactive.  Admitted to the hospital for further evaluation and management.  Started on supportive care with IV antiemetics, IV Protonix.  Clear liquid diet to allow GI tract to rest.    Nephrology consulted for dialysis.  Assessment and Plan: * Intractable nausea and vomiting Likely Acute gastritis that could be viral. Continue supportive care with IV antiemetics, IV PPI.  Heart healthy diet as tolerated. GI consulted - see their recommnedations. There was planned EGD/C-scope as outpatient 12/15  Type 1 diabetes mellitus with hypoglycemia without coma, without long-term current use of insulin (HCC) Hypoglycemia due to N/V and lack of PO intake. --Hypoglycemic protocol --Continue D10W at 50 cc /hr --Hold off insulin for now  Essential hypertension Continue her antihypertensives. PRN IV hydralazine ordered.  End-stage renal disease on hemodialysis [facility]) Nephrology consulted for dialysis. Dialyzed 12/14.  Dyslipidemia Continue statin  Peripheral neuropathy Continue  Neurontin.  Exocrine pancreatic insufficiency Follows with GI. Continue Creon  Recurrent Clostridioides difficile infection Not likely active infection.  She is followed by GI for recurrent C diff and had repeat stool studies on 12/3 which were antigen positive, toxin negative, but PCR positive. Pt has intractable N/V but has denied recent diarrhea.  No treatment indicated at this time.          Subjective: Pt was sleeping but woke easily.  She is holding emesis bag again, stated she got nauseated and threw up after attempting to eat breakfast.  No other acute complaints.    Physical Exam: Vitals:   09/04/22 1351 09/04/22 1709 09/04/22 1933 09/05/22 0843  BP: (!) 155/74 (!) 152/56 (!) 126/50 (!) 147/60  Pulse: 62 68 63 61  Resp: '18 16 20 16  '$ Temp: 98.3 F (36.8 C) 98 F (36.7 C) 97.7 F (36.5 C) 97.7 F (36.5 C)  TempSrc:      SpO2: 100% 100% 94% 100%  Weight:      Height:       General exam: awake, appears drowsy, no acute distress, chronically ill-appearing, holding emesis bag HEENT: moist mucus membranes, very hard of hearing Respiratory system: CTAB diminished bases, no wheezes, rales or rhonchi, normal respiratory effort. Cardiovascular system: normal S1/S2, RRR.   Gastrointestinal system: abdomen soft with epigastric tenderness with guarding, no rebound Central nervous system: no gross focal neurologic deficits, normal speech Extremities: BLE venous stasis and skin wrinkling consistent with prior edema Psychiatry: normal mood, flat affect, unable to assess judgement and insight due to minimal interaction   Data Reviewed:  Notable labs ---  CBGs on D10w drip are 126 >> 105 >> 105. No new BMP or CBC today  Recent C diff antigen positive, PCR positive, toxin negative.  Pt not having diarrhea but has history of recurrent C diff.  GI recommended no treatment.  Family Communication: None  Disposition: Status is: Inpatient  Remains admitted due to ongoing N/V,  not tolerating PO intake, on D10w drip to prevent recurrent hypoglycemia.  GI evaluation is pending.  She is not medically ready for discharge.     Planned Discharge Destination: Home    Time spent: 45 minutes  Author: Ezekiel Slocumb, DO 09/05/2022 2:34 PM  For on call review [email or web address]

## 2022-09-05 NOTE — Progress Notes (Signed)
[facility]  ROUNDING NOTE   Subjective:   [name] is a 61 y.o., female with past medical history of anemia, asthma, diabetes, CAD, PAD, GERD, and end stage renal disease on dialysis.  Patient arrived to emergency department via EMS with complaints of abdominal pain.  Patient has been admitted for Generalized abdominal pain [R10.84] Intractable nausea and vomiting [R11.2] Gastritis without bleeding, unspecified chronicity, unspecified gastritis type [K29.70]  Patient is known to our practice and receives outpatient dialysis treatments at [facility] on a TTS schedule, supervised by Dr. Holley Raring.    Patient seen sitting up in bed Alert and oriented Complains of nausea after taking a few bites of breakfast.  Unsure if she continues to have diarrhea  Objective:  Vital signs in last 24 hours:  Temp:  [97.7 F (36.5 C)-98.3 F (36.8 C)] 97.7 F (36.5 C) (12/15 0843) Pulse Rate:  [58-68] 61 (12/15 0843) Resp:  [9-20] 16 (12/15 0843) BP: (126-180)/(50-88) 147/60 (12/15 0843) SpO2:  [94 %-100 %] 100 % (12/15 0843) Weight:  [76.4 kg] 76.4 kg (12/14 1332)  Weight change: 0.9 kg Filed Weights   09/03/22 1247 09/04/22 0926 09/04/22 1332  Weight: 79.2 kg 80.1 kg 76.4 kg    Intake/Output: I/O last 3 completed shifts: In: 576.9 [I.V.:576.9] Out: 1000 [Other:1000]   Intake/Output this shift:  No intake/output data recorded.  Physical Exam: General: Ill-appearing  Head: Normocephalic, atraumatic. Moist oral mucosal membranes  Eyes: Anicteric  Lungs:  Clear to auscultation. Normal effort, [place] O2  Heart: Regular rate and rhythm  Abdomen:  Soft, nontender  Extremities:  No peripheral edema.  Neurologic: Nonfocal, moving all four extremities  Skin: No lesions, dry  Access: Rt permcath    Basic Metabolic Panel: Recent Labs  Lab 09/03/22 1313 09/04/22 0232  NA 136 133*  K 3.1* 4.3  CL 97* 99  CO2 32 27  GLUCOSE 84 190*  BUN 7* 7*  CREATININE 4.02* 4.25*   CALCIUM 7.8* 7.4*  MG 1.9  --      Liver Function Tests: Recent Labs  Lab 09/03/22 1313  AST 25  ALT 9  ALKPHOS 76  BILITOT 0.6  PROT 6.5  ALBUMIN 2.7*     Recent Labs  Lab 09/03/22 1313  LIPASE 26     No results for input(s): "AMMONIA" in the last 168 hours.  CBC: Recent Labs  Lab 09/03/22 1313 09/04/22 0408  WBC 4.1 4.6  HGB 8.9* 9.2*  HCT 28.0* 27.7*  MCV 81.4 78.9*  PLT 110* 115*     Cardiac Enzymes: No results for input(s): "CKTOTAL", "CKMB", "CKMBINDEX", "TROPONINI" in the last 168 hours.   BNP: Invalid input(s): "POCBNP"  CBG: Recent Labs  Lab 09/03/22 2225 09/04/22 0731 09/04/22 1715 09/04/22 2132 09/05/22 0812  GLUCAP 173* 170* 154* 126* 105*      Microbiology: Results for orders placed or performed during the hospital encounter of 08/24/22  Gastrointestinal Panel by PCR , Stool     Status: None   Collection Time: 08/24/22  4:20 PM   Specimen: Stool  Result Value Ref Range Status   Campylobacter species NOT DETECTED NOT DETECTED Final   Plesimonas shigelloides NOT DETECTED NOT DETECTED Final   Salmonella species NOT DETECTED NOT DETECTED Final   Yersinia enterocolitica NOT DETECTED NOT DETECTED Final   Vibrio species NOT DETECTED NOT DETECTED Final   Vibrio cholerae NOT DETECTED NOT DETECTED Final   Enteroaggregative E coli (EAEC) NOT DETECTED NOT DETECTED Final   Enteropathogenic  E coli (EPEC) NOT DETECTED NOT DETECTED Final   Enterotoxigenic E coli (ETEC) NOT DETECTED NOT DETECTED Final   Shiga like toxin producing E coli (STEC) NOT DETECTED NOT DETECTED Final   Shigella/Enteroinvasive E coli (EIEC) NOT DETECTED NOT DETECTED Final   Cryptosporidium NOT DETECTED NOT DETECTED Final   Cyclospora cayetanensis NOT DETECTED NOT DETECTED Final   Entamoeba histolytica NOT DETECTED NOT DETECTED Final   Giardia lamblia NOT DETECTED NOT DETECTED Final   Adenovirus F40/41 NOT DETECTED NOT DETECTED Final   Astrovirus NOT DETECTED NOT  DETECTED Final   Norovirus GI/GII NOT DETECTED NOT DETECTED Final   Rotavirus A NOT DETECTED NOT DETECTED Final   Sapovirus (I, II, IV, and V) NOT DETECTED NOT DETECTED Final    Comment: Performed at [facility], [address] [place], [place] [address]  C Difficile Quick Screen w PCR reflex     Status: Abnormal   Collection Time: 08/24/22  4:20 PM   Specimen: STOOL  Result Value Ref Range Status   C Diff antigen POSITIVE (A) NEGATIVE Final   C Diff toxin NEGATIVE NEGATIVE Final   C Diff interpretation Results are indeterminate. See PCR results.  Final    Comment: Performed at [facility], [address] [place], [place] [address]  C. Diff by PCR, Reflexed     Status: Abnormal   Collection Time: 08/24/22  4:20 PM  Result Value Ref Range Status   Toxigenic C. Difficile by PCR POSITIVE (A) NEGATIVE Final    Comment: Positive for toxigenic C. difficile with little to no toxin production. Only treat if clinical presentation suggests symptomatic illness. Performed at [facility], [address] [place], [place] [address]     Coagulation Studies: No results for input(s): "LABPROT", "INR" in the last 72 hours.   Urinalysis: No results for input(s): "COLORURINE", "LABSPEC", "PHURINE", "GLUCOSEU", "HGBUR", "BILIRUBINUR", "KETONESUR", "PROTEINUR", "UROBILINOGEN", "NITRITE", "LEUKOCYTESUR" in the last 72 hours.  Invalid input(s): "APPERANCEUR"    Imaging: CT ABDOMEN PELVIS W CONTRAST  Result Date: 09/03/2022 CLINICAL DATA:  Umbilical abdominal pain.  Patient on dialysis. EXAM: CT ABDOMEN AND PELVIS WITH CONTRAST TECHNIQUE: Multidetector CT imaging of the abdomen and pelvis was performed using the standard protocol following bolus administration of intravenous contrast. RADIATION DOSE REDUCTION: This exam was performed according to the departmental dose-optimization program which includes automated exposure control, adjustment of the mA and/or kV according  to patient size and/or use of iterative reconstruction technique. CONTRAST:  116m OMNIPAQUE IOHEXOL 300 MG/ML  SOLN COMPARISON:  07/01/2022 FINDINGS: Lower chest: Progressive septal thickening at the lung bases. Areas of peribronchovascular ground-glass. Moderate cardiomegaly. Right coronary artery calcification. Incompletely imaged central line. Small bilateral pleural effusions. Hepatobiliary: Normal liver. Cholecystectomy. No biliary duct dilatation. Pancreas: Pancreatic atrophy.  No duct dilatation. Spleen: Normal in size, without focal abnormality. Adrenals/Urinary Tract: Normal adrenal glands. Bilateral native renal marked atrophy. Interpolar left renal 1.7 cm cyst or minimally complex cyst . In the absence of clinically indicated signs/symptoms require(s) no independent follow-up. No hydronephrosis. The bladder is moderately thick walled including on 72/3. Stomach/Bowel: The stomach is incompletely distended. Again identified is apparent wall thickening including within the antrum on 34/3. Extensive colonic diverticulosis. Normal terminal ileum. Appendix not visualized. Normal small bowel. Vascular/Lymphatic: Markedly age advanced aortic and branch vessel atherosclerosis. Index left abdominal retroperitoneal 1.1 cm node on 30/3 similar to 1.2 cm on the prior. Gastrohepatic ligament 11 mm node on 19/3 is relatively similar to on the prior. Left inguinal  index node measures 1.2 cm on 79/3 versus 1.4 cm on the prior. Left external iliac 1.4 cm node on 68/3 measured 1.5 cm previously. Reproductive: Hysterectomy. Other: No free intraperitoneal air. Small volume abdominopelvic ascites is similar. Diffuse anasarca. Musculoskeletal: Renal osteodystrophy. IMPRESSION: 1.  No acute process in the abdomen or pelvis. 2. Fluid overload, as evidenced by similar anasarca, small volume abdominopelvic ascites, and small bilateral pleural effusions. Worsened bibasilar aeration, likely related to increased interstitial and  alveolar edema. 3. Apparent gastric wall thickening which could be partially due to underdistention. Correlate with symptoms of gastritis. 4. Bladder wall thickening, suspicious for cystitis. 5. Similar and mildly improved abdominopelvic adenopathy, favoring a reactive etiology. As before, laboratory and clinical correlation for lymphoproliferative process suggested. 6. Coronary artery atherosclerosis. Aortic Atherosclerosis (ICD10-I70.0). Electronically Signed   By: Abigail Miyamoto M.D.   On: 09/03/2022 16:15     Medications:    anticoagulant sodium citrate     dextrose 50 mL/hr at 09/04/22 1638   promethazine (PHENERGAN) injection (IM or IVPB)       acidophilus  1 capsule Oral Daily   amLODipine  10 mg Oral Daily   Chlorhexidine Gluconate Cloth  6 each Topical Daily   cholecalciferol  1,000 Units Oral Daily   cyanocobalamin  1,000 mcg Oral Daily   feeding supplement (NEPRO CARB STEADY)  237 mL Oral BID BM   ferrous sulfate  325 mg Oral Daily   fluticasone  2 spray Each Nare Daily   furosemide  80 mg Oral Daily   heparin injection (subcutaneous)  5,000 Units Subcutaneous Q8H   hydrALAZINE  50 mg Oral BID   HYDROcodone-acetaminophen  1 tablet Oral Q8H   insulin aspart  0-5 Units Subcutaneous QHS   insulin aspart  0-9 Units Subcutaneous TID WC   lipase/protease/amylase  72,000 Units Oral TID WC   mirtazapine  7.5 mg Oral QHS   multivitamin  1 tablet Oral Daily   pantoprazole (PROTONIX) IV  40 mg Intravenous Q12H   sevelamer carbonate  800 mg Oral TID WC   acetaminophen **OR** acetaminophen, albuterol, alteplase, alum & mag hydroxide-simeth, anticoagulant sodium citrate, heparin, hydrALAZINE, hydrOXYzine, lidocaine (PF), lidocaine-prilocaine, lipase/protease/amylase **AND** lipase/protease/amylase, meclizine, mouth rinse, pentafluoroprop-tetrafluoroeth, polyethylene glycol, promethazine, sevelamer carbonate **AND** sevelamer carbonate, traZODone  Assessment/ Plan:  [name] is a 61  y.o.  female with past medical history of anemia, asthma, diabetes, CAD, PAD, GERD, and end stage renal disease on dialysis.  Patient arrived to emergency department via EMS after being found at home by grandson with multiple space heaters deterrent to high setting and patient wrapped in heated blanket.  Patient has been admitted for Generalized abdominal pain [R10.84] Intractable nausea and vomiting [R11.2] Gastritis without bleeding, unspecified chronicity, unspecified gastritis type [K29.70]  [facility]/TTS/Rt Permcath  End-stage renal disease on hemodialysis.    Dialysis received yesterday, UF goal 1 L achieved.  Next treatment scheduled for Saturday.  2. Anemia of chronic kidney disease Lab Results  Component Value Date   HGB 9.2 (L) 09/04/2022    Patient receives [facility] outpatient.  Hemoglobin acceptable at this time.  3. Secondary Hyperparathyroidism: with outpatient labs: PTH 485, phosphorus 3.9, calcium 8.5 on 06/03/22.   Lab Results  Component Value Date   PTH 357 (H) 10/05/2018   CALCIUM 7.4 (L) 09/04/2022   CAION 1.09 (L) 04/13/2020   PHOS 2.3 (L) 07/08/2022   Calcium decreased currently. Continue Sevelamer with meals.   4. Diabetes mellitus type II with chronic  kidney disease/renal manifestations:noninsulin dependent.   Glucose stable.  Primary team to manage sliding scale insulin.  5.  Hypertension with chronic kidney disease.     Currently prescribed amlodipine, furosemide, and hydralazine.  Blood pressure acceptable, 147/60.     LOS: 0   12/15/202311:45 AM

## 2022-09-05 NOTE — Consult Note (Signed)
[name] , MD [address] [place], [place], [place], [address] [address], [place], [place], [place], [address] Phone: [phone]  Fax: [phone]  Consultation  Referring Provider:    Dr Arbutus Ped Primary Care Physician:  Denton Lank, MD Primary Gastroenterologist:  Dr. Alice Reichert         Reason for Consultation:     Nausea and vomiting   Date of Admission:  09/03/2022 Date of Consultation:  09/05/2022         HPI:   [name] is a 61 y.o. female follows with [facility] gastroenterology as an outpatient last seen at their office in November 2023.  She carries a diagnosis of type 1 diabetes, end-stage renal disease on dialysis dyspepsia, gastroparesis, exocrine pancreatic insufficiency due to chronic pancreatitis, recurrent C. difficile s/p fecal transplant in 2016 and prior colon polyps.  When seen at the office in November 2023 tested positive for C. difficile in 05/11/2022 and treated with 10 days of Dificid she had been scheduled for an EGD and colonoscopy but was canceled as she was feeling sick when seen at the office in November 2023 she had chronic diarrhea.  She also had dysphagia scheduled for EGD and colonoscopy on 09/05/2022 .  Admitted on 09/03/2022 with abdominal pain of 1 day duration underwent a CT scan of the abdomen showed no acute abdominal process with fluid overload with ascites small bilateral pleural effusions and interstitial alveolar edema gastric wall thickening.  Had nausea vomiting.   Hemoglobin on admission 9.2 g with an MCV of 78 creatinine 4.25 which is longstanding 08/24/2022 C. difficile positive by PCR antigen positive, GI PCR panel negative.  When I went in to see the patient she was very drowsy and gave minimal history , she did says she had abdominal pain for 2 days and presently does not have diarrhea. She has not thrown up since coming in. Was appearing comfortable.  Past Medical History:  Diagnosis Date   Anemia    Anginal pain  (HCC)    Anxiety    Arthritis    Asthma    Broken wrist    Bronchitis    chronic diastolic CHF 1/51/7616   Chronic kidney disease    COPD (chronic obstructive pulmonary disease) ([place])    Coronary artery disease    a. cath 2013: stenting to RCA (report not available); b. cath 2014: LM nl, pLAD 40%, mLAD nl, ost LCx 40%, mid LCx nl, pRCA 30% @ site of prior stent, mRCA 50%   Depression    Diabetes mellitus ([place])    Diabetes mellitus without complication ([place])    Diabetic neuropathy ([place])    dialysis 2006   Diverticulosis    Dizziness    Dyspnea    Elevated lipids    Environmental and seasonal allergies    ESRD (end stage renal disease) on dialysis ([place])    M-W-F   Gastroparesis    GERD (gastroesophageal reflux disease)    Headache    History of anemia due to chronic kidney disease    History of hiatal hernia    HOH (hard of hearing)    Hx of pancreatitis 2015   Hypertension    Intractable nausea and vomiting 09/03/2022   Lower extremity edema    Mitral regurgitation    a. echo 10/2013: EF 62%, noWMA, mildly dilated LA, mild to mod MR/TR, GR1DD   Myocardial infarction ([place])    Orthopnea    Parathyroid abnormality ([place])  Peripheral arterial disease (HCC)    Pneumonia    Renal cancer ([place])    Renal insufficiency    Pt is on dialysis on M,W + F.   Wheezing     Past Surgical History:  Procedure Laterality Date   A/V SHUNTOGRAM Left 01/20/2018   Procedure: A/V SHUNTOGRAM;  Surgeon: Algernon Huxley, MD;  Location: [place] [facility];  Service: Cardiovascular;  Laterality: Left;   ABDOMINAL HYSTERECTOMY  1992   AMPUTATION TOE Left 10/02/2017   Procedure: AMPUTATION TOE-LEFT GREAT TOE;  Surgeon: Albertine Patricia, DPM;  Location: [facility];  Service: Podiatry;  Laterality: Left;   APPENDECTOMY     APPLICATION OF WOUND VAC N/A 11/25/2019   Procedure: APPLICATION OF WOUND VAC;  Surgeon: Katha Cabal, MD;  Location: [facility];  Service: Vascular;  Laterality: N/A;    ARTERY BIOPSY Right 10/11/2018   Procedure: BIOPSY TEMPORAL ARTERY;  Surgeon: Vickie Epley, MD;  Location: [facility];  Service: General;  Laterality: Right;   BREAST CYST EXCISION     CARDIAC CATHETERIZATION Left 07/26/2015   Procedure: Left Heart Cath and Coronary Angiography;  Surgeon: Dionisio David, MD;  Location: [place] [facility];  Service: Cardiovascular;  Laterality: Left;   CATARACT EXTRACTION W/ INTRAOCULAR LENS IMPLANT Right    CATARACT EXTRACTION W/PHACO Left 03/10/2017   Procedure: CATARACT EXTRACTION PHACO AND INTRAOCULAR LENS PLACEMENT (IOC);  Surgeon: Birder Robson, MD;  Location: [facility];  Service: Ophthalmology;  Laterality: Left;  [place] 00:51.9 AP% 14.2 CDE 7.39 fluid pack lot # [id number]   CENTRAL LINE INSERTION Right 11/11/2019   Procedure: CENTRAL LINE INSERTION;  Surgeon: Katha Cabal, MD;  Location: [facility];  Service: Vascular;  Laterality: Right;   CENTRAL LINE INSERTION  11/25/2019   Procedure: CENTRAL LINE INSERTION;  Surgeon: Katha Cabal, MD;  Location: [facility];  Service: Vascular;;   CHOLECYSTECTOMY     COLONOSCOPY WITH PROPOFOL N/A 08/12/2016   Procedure: COLONOSCOPY WITH PROPOFOL;  Surgeon: Lollie Sails, MD;  Location: [facility];  Service: Endoscopy;  Laterality: N/A;   DIALYSIS FISTULA CREATION Left    upper arm   dialysis grafts     DIALYSIS/PERMA CATHETER INSERTION N/A 11/14/2019   Procedure: DIALYSIS/PERMA CATHETER INSERTION;  Surgeon: Algernon Huxley, MD;  Location: [facility];  Service: Cardiovascular;  Laterality: N/A;   DIALYSIS/PERMA CATHETER INSERTION N/A 02/03/2020   Procedure: DIALYSIS/PERMA CATHETER INSERTION;  Surgeon: Katha Cabal, MD;  Location: [facility];  Service: Cardiovascular;  Laterality: N/A;   DIALYSIS/PERMA CATHETER INSERTION N/A 06/19/2020   Procedure: DIALYSIS/PERMA CATHETER INSERTION;  Surgeon: Katha Cabal, MD;  Location: [facility];  Service: Cardiovascular;   Laterality: N/A;   DIALYSIS/PERMA CATHETER INSERTION N/A 05/07/2021   Procedure: DIALYSIS/PERMA CATHETER INSERTION;  Surgeon: Katha Cabal, MD;  Location: [facility];  Service: Cardiovascular;  Laterality: N/A;  Patient needs general anethesia   DIALYSIS/PERMA CATHETER INSERTION N/A 06/17/2022   Procedure: DIALYSIS/PERMA CATHETER INSERTION;  Surgeon: Katha Cabal, MD;  Location: [facility];  Service: Cardiovascular;  Laterality: N/A;   DIALYSIS/PERMA CATHETER REMOVAL N/A 05/25/2020   Procedure: DIALYSIS/PERMA CATHETER REMOVAL;  Surgeon: Katha Cabal, MD;  Location: [facility];  Service: Cardiovascular;  Laterality: N/A;   ESOPHAGOGASTRODUODENOSCOPY N/A 03/08/2015   Procedure: ESOPHAGOGASTRODUODENOSCOPY (EGD);  Surgeon: Manya Silvas, MD;  Location: [facility];  Service: Endoscopy;  Laterality: N/A;   ESOPHAGOGASTRODUODENOSCOPY (EGD) WITH PROPOFOL N/A 03/18/2016  Procedure: ESOPHAGOGASTRODUODENOSCOPY (EGD) WITH PROPOFOL;  Surgeon: [name], MD;  Location: [facility];  Service: Endoscopy;  Laterality: N/A;   EYE SURGERY Right 2018   FECAL TRANSPLANT N/A 08/23/2015   Procedure: FECAL TRANSPLANT;  Surgeon: Manya Silvas, MD;  Location: [facility];  Service: Endoscopy;  Laterality: N/A;   HAND SURGERY Bilateral    HEMATOMA EVACUATION Left 11/25/2019   Procedure: EVACUATION HEMATOMA;  Surgeon: Katha Cabal, MD;  Location: [facility];  Service: Vascular;  Laterality: Left;   I & D EXTREMITY Left 11/25/2019   Procedure: IRRIGATION AND DEBRIDEMENT EXTREMITY;  Surgeon: Katha Cabal, MD;  Location: [facility];  Service: Vascular;  Laterality: Left;   IR FLUORO GUIDE CV LINE RIGHT  04/06/2020   IR INJECT/THERA/INC NEEDLE/CATH/PLC EPI/CERV/THOR W/IMG  08/13/2020   IR RADIOLOGIST EVAL & MGMT  07/28/2019   IR RADIOLOGIST EVAL & MGMT  08/11/2019   LIGATION OF ARTERIOVENOUS  FISTULA Left 11/11/2019   LIGATION OF ARTERIOVENOUS   FISTULA Left 11/11/2019   Procedure: LIGATION OF ARTERIOVENOUS  FISTULA;  Surgeon: Katha Cabal, MD;  Location: [facility];  Service: Vascular;  Laterality: Left;   LIGATIONS OF HERO GRAFT Right 06/13/2020   Procedure: LIGATION / REMOVAL OF RIGHT HERO GRAFT;  Surgeon: Katha Cabal, MD;  Location: [facility];  Service: Vascular;  Laterality: Right;   PERIPHERAL VASCULAR CATHETERIZATION N/A 12/20/2015   Procedure: Thrombectomy of dialysis access versus permcath placement;  Surgeon: Algernon Huxley, MD;  Location: [place] [facility];  Service: Cardiovascular;  Laterality: N/A;   PERIPHERAL VASCULAR CATHETERIZATION N/A 12/20/2015   Procedure: A/V Shunt Intervention;  Surgeon: Algernon Huxley, MD;  Location: [facility] [facility] [facility];  Service: Cardiovascular;  Laterality: N/A;   PERIPHERAL VASCULAR CATHETERIZATION N/A 12/20/2015   Procedure: A/V Shuntogram/Fistulagram;  Surgeon: Algernon Huxley, MD;  Location: [place] [facility] [facility];  Service: Cardiovascular;  Laterality: N/A;   PERIPHERAL VASCULAR CATHETERIZATION N/A 01/02/2016   Procedure: A/V Shuntogram/Fistulagram;  Surgeon: Algernon Huxley, MD;  Location: [place] [facility];  Service: Cardiovascular;  Laterality: N/A;   PERIPHERAL VASCULAR CATHETERIZATION N/A 01/02/2016   Procedure: A/V Shunt Intervention;  Surgeon: Algernon Huxley, MD;  Location: [place] [facility];  Service: Cardiovascular;  Laterality: N/A;   TEE WITHOUT CARDIOVERSION N/A 06/11/2020   Procedure: TRANSESOPHAGEAL ECHOCARDIOGRAM (TEE);  Surgeon: Teodoro Spray, MD;  Location: [facility];  Service: Cardiovascular;  Laterality: N/A;   UPPER EXTREMITY ANGIOGRAPHY Bilateral 11/27/2020   Procedure: UPPER EXTREMITY ANGIOGRAPHY;  Surgeon: Katha Cabal, MD;  Location: [place] [facility];  Service: Cardiovascular;  Laterality: Bilateral;   UPPER EXTREMITY VENOGRAPHY Right 01/18/2020   Procedure: UPPER EXTREMITY VENOGRAPHY;  Surgeon: Katha Cabal, MD;  Location: [facility];   Service: Cardiovascular;  Laterality: Right;   UPPER EXTREMITY VENOGRAPHY Bilateral 11/27/2020   Procedure: UPPER EXTREMITY VENOGRAPHY;  Surgeon: Katha Cabal, MD;  Location: [facility];  Service: Cardiovascular;  Laterality: Bilateral;   UPPER EXTREMITY VENOGRAPHY Left 11/05/2021   Procedure: UPPER EXTREMITY VENOGRAPHY;  Surgeon: Katha Cabal, MD;  Location: [place] [facility];  Service: Cardiovascular;  Laterality: Left;   VASCULAR ACCESS DEVICE INSERTION Right 04/13/2020   Procedure: INSERTION OF HERO VASCULAR ACCESS DEVICE (GRAFT);  Surgeon: Katha Cabal, MD;  Location: [facility];  Service: Vascular;  Laterality: Right;    Prior to Admission medications   Medication Sig Start Date End Date Taking? Authorizing Provider  atorvastatin (LIPITOR) 20 MG tablet  Take 20 mg by mouth daily. 08/20/22  Yes [provider]  colestipol (COLESTID) 1 g tablet Take 2 g by mouth 2 (two) times daily. 08/11/22 08/11/23 Yes [provider]  acetaminophen (TYLENOL) 325 MG tablet Take 2 tablets (650 mg total) by mouth every 6 (six) hours as needed for mild pain, headache, moderate pain or fever (fever >/= 101). 12/06/21   Bonnielee Haff, MD  albuterol (VENTOLIN HFA) 108 (90 Base) MCG/ACT inhaler Inhale 2 puffs into the lungs every 4 (four) hours as needed for wheezing or shortness of breath. 11/09/19   [provider]  alum & mag hydroxide-simeth (MAALOX/MYLANTA) 200-200-20 MG/5ML suspension Take 15 mLs by mouth every 6 (six) hours as needed for indigestion or heartburn. 06/25/20   Enzo Bi, MD  amLODipine (NORVASC) 10 MG tablet Take 1 tablet (10 mg total) by mouth daily. 06/25/20   Enzo Bi, MD  ammonium lactate (LAC-HYDRIN) 12 % lotion Apply 1 application topically 2 (two) times daily as needed for dry skin.     [provider]  aspirin EC 81 MG tablet Take 81 mg by mouth daily.    [provider]  calcium carbonate (TUMS - DOSED IN MG  ELEMENTAL CALCIUM) 500 MG chewable tablet Chew 2.5 tablets (500 mg of elemental calcium total) by mouth every 6 (six) hours as needed for indigestion. 06/25/20   Enzo Bi, MD  Cholecalciferol (VITAMIN D3) 25 MCG (1000 UT) CAPS Take 1,000 Units by mouth daily.     [provider]  CREON 36000-114000 units CPEP capsule Take 36,000 Units by mouth. Take 2 capsules with meals and 1 capsule with snacks 03/12/22   [provider]  cyanocobalamin 1000 MCG tablet Take 1,000 mcg by mouth daily.     [provider]  epoetin alfa (EPOGEN) 10000 UNIT/ML injection Inject 1 mL (10,000 Units total) into the vein every Monday, Wednesday, and Friday with hemodialysis. 06/27/20   Enzo Bi, MD  Ferrous Sulfate (IRON) 325 (65 Fe) MG TABS Take 325 mg by mouth daily.     [provider]  fluticasone (FLONASE) 50 MCG/ACT nasal spray Place 2 sprays into both nostrils daily. 10/20/21   [provider]  furosemide (LASIX) 80 MG tablet Take 80 mg by mouth daily.    [provider]  gabapentin (NEURONTIN) 100 MG capsule Take 100 mg by mouth at bedtime.    [provider]  hydrALAZINE (APRESOLINE) 100 MG tablet Take 0.5 tablets (50 mg total) by mouth 2 (two) times daily. 12/06/21   Bonnielee Haff, MD  HYDROcodone-acetaminophen (NORCO/VICODIN) 5-325 MG tablet Take 1 tablet by mouth as directed. 04/18/22   [provider]  hydrOXYzine (ATARAX) 25 MG tablet Take 25 mg by mouth 3 (three) times daily as needed.    [provider]  lidocaine (LIDODERM) 5 % Place 1 patch onto the skin daily. (Remove after 12 hours -- leave off for 12 hours before applying new patch)    [provider]  meclizine (ANTIVERT) 25 MG tablet Take 25 mg by mouth every 6 (six) hours as needed for dizziness.    [provider]  mirtazapine (REMERON) 7.5 MG tablet Take 7.5 mg by mouth daily. 04/15/21   [provider]  multivitamin (RENA-VIT) TABS tablet Take 1  tablet by mouth daily.     [provider]  mupirocin ointment (BACTROBAN) 2 % Apply 1 application topically daily as needed (leg rash).  11/09/19   [provider]  Nutritional Supplements (FEEDING  SUPPLEMENT, NEPRO CARB STEADY,) LIQD Take 237 mLs by mouth 2 (two) times daily between meals. 12/17/20   Lorella Nimrod, MD  pantoprazole (PROTONIX) 40 MG tablet Take 1 tablet (40 mg total) by mouth 2 (two) times daily. 11/17/21 12/17/21  Sharen Hones, MD  Probiotic Product (PROBIOTIC PO) Take 1 capsule by mouth daily.    [provider]  promethazine (PHENERGAN) 12.5 MG tablet Take 1 tablet (12.5 mg total) by mouth every 6 (six) hours as needed for nausea or vomiting. 05/10/21   Wouk, Ailene Rud, MD  sevelamer carbonate (RENVELA) 800 MG tablet Take 800 mg by mouth 4 (four) times daily. (Take with meals and snack)    [provider]    Family History  Problem Relation Age of Onset   Kidney disease Mother    Diabetes Mother    Cancer Father    Kidney disease Sister      Social History   Tobacco Use   Smoking status: Never   Smokeless tobacco: Never  Vaping Use   Vaping Use: Never used  Substance Use Topics   Alcohol use: Not Currently    Comment: glass wine week per pt   Drug use: Yes    Types: Marijuana    Comment: once a day    Allergies as of 09/03/2022 - Review Complete 09/03/2022  Allergen Reaction Noted   Ace inhibitors Swelling and Anaphylaxis 04/25/2014   Ativan [lorazepam] Other (See Comments) 04/10/2015   Compazine [prochlorperazine edisylate] Anaphylaxis, Nausea And Vomiting, and Other (See Comments) 09/12/2009   Sumatriptan succinate Other (See Comments) 09/12/2009   Zofran [ondansetron] Nausea And Vomiting 11/11/2019   Losartan Nausea Only 03/06/2015   Prochlorperazine Other (See Comments) 08/22/2015   Reglan [metoclopramide] Other (See Comments) 04/22/2016   Scopolamine Other (See Comments) 05/21/2015   Tape Rash 02/08/2015    Tapentadol Rash 02/08/2015   Ultrasound gel Itching 11/07/2020    Review of Systems:    All systems reviewed and negative except where noted in HPI.   Physical Exam:  Vital signs in last 24 hours: Temp:  [97.4 F (36.3 C)-98.3 F (36.8 C)] 97.7 F (36.5 C) (12/14 1933) Pulse Rate:  [51-73] 63 (12/14 1933) Resp:  [8-20] 20 (12/14 1933) BP: (126-191)/(50-88) 126/50 (12/14 1933) SpO2:  [94 %-100 %] 94 % (12/14 1933) Weight:  [76.4 kg-80.1 kg] 76.4 kg (12/14 1332) Last BM Date : 09/04/22 General:   Pleasant, cooperative in NAD Head:  Normocephalic and atraumatic. Eyes:   No icterus.   Conjunctiva pink. PERRLA. Ears: Hard of hearing Neck:  Supple; no masses or thyroidomegaly Lungs: Respirations even and unlabored. Lungs clear to auscultation bilaterally.   No wheezes, crackles, or rhonchi.  Heart:  Regular rate and rhythm;  Without murmur, clicks, rubs or gallops Abdomen:  Soft, nondistended, mild generalized tenderness  Normal bowel sounds. No appreciable masses or hepatomegaly.  No rebound or guarding.  Neurologic:  Alert and oriented x2; very drowsy Psych:  Alert and cooperative. Normal affect.  LAB RESULTS: Recent Labs    09/03/22 1313 09/04/22 0408  WBC 4.1 4.6  HGB 8.9* 9.2*  HCT 28.0* 27.7*  PLT 110* 115*   BMET Recent Labs    09/03/22 1313 09/04/22 0232  NA 136 133*  K 3.1* 4.3  CL 97* 99  CO2 32 27  GLUCOSE 84 190*  BUN 7* 7*  CREATININE 4.02* 4.25*  CALCIUM 7.8* 7.4*   LFT Recent Labs    09/03/22 1313  PROT 6.5  ALBUMIN 2.7*  AST 25  ALT 9  ALKPHOS 76  BILITOT 0.6   PT/INR No results for input(s): "LABPROT", "INR" in the last 72 hours.  STUDIES: CT ABDOMEN PELVIS W CONTRAST  Result Date: 09/03/2022 CLINICAL DATA:  Umbilical abdominal pain.  Patient on dialysis. EXAM: CT ABDOMEN AND PELVIS WITH CONTRAST TECHNIQUE: Multidetector CT imaging of the abdomen and pelvis was performed using the standard protocol following bolus administration of  intravenous contrast. RADIATION DOSE REDUCTION: This exam was performed according to the departmental dose-optimization program which includes automated exposure control, adjustment of the mA and/or kV according to patient size and/or use of iterative reconstruction technique. CONTRAST:  170m OMNIPAQUE IOHEXOL 300 MG/ML  SOLN COMPARISON:  07/01/2022 FINDINGS: Lower chest: Progressive septal thickening at the lung bases. Areas of peribronchovascular ground-glass. Moderate cardiomegaly. Right coronary artery calcification. Incompletely imaged central line. Small bilateral pleural effusions. Hepatobiliary: Normal liver. Cholecystectomy. No biliary duct dilatation. Pancreas: Pancreatic atrophy.  No duct dilatation. Spleen: Normal in size, without focal abnormality. Adrenals/Urinary Tract: Normal adrenal glands. Bilateral native renal marked atrophy. Interpolar left renal 1.7 cm cyst or minimally complex cyst . In the absence of clinically indicated signs/symptoms require(s) no independent follow-up. No hydronephrosis. The bladder is moderately thick walled including on 72/3. Stomach/Bowel: The stomach is incompletely distended. Again identified is apparent wall thickening including within the antrum on 34/3. Extensive colonic diverticulosis. Normal terminal ileum. Appendix not visualized. Normal small bowel. Vascular/Lymphatic: Markedly age advanced aortic and branch vessel atherosclerosis. Index left abdominal retroperitoneal 1.1 cm node on 30/3 similar to 1.2 cm on the prior. Gastrohepatic ligament 11 mm node on 19/3 is relatively similar to on the prior. Left inguinal index node measures 1.2 cm on 79/3 versus 1.4 cm on the prior. Left external iliac 1.4 cm node on 68/3 measured 1.5 cm previously. Reproductive: Hysterectomy. Other: No free intraperitoneal air. Small volume abdominopelvic ascites is similar. Diffuse anasarca. Musculoskeletal: Renal osteodystrophy. IMPRESSION: 1.  No acute process in the abdomen or  pelvis. 2. Fluid overload, as evidenced by similar anasarca, small volume abdominopelvic ascites, and small bilateral pleural effusions. Worsened bibasilar aeration, likely related to increased interstitial and alveolar edema. 3. Apparent gastric wall thickening which could be partially due to underdistention. Correlate with symptoms of gastritis. 4. Bladder wall thickening, suspicious for cystitis. 5. Similar and mildly improved abdominopelvic adenopathy, favoring a reactive etiology. As before, laboratory and clinical correlation for lymphoproliferative process suggested. 6. Coronary artery atherosclerosis. Aortic Atherosclerosis (ICD10-I70.0). Electronically Signed   By: KAbigail MiyamotoM.D.   On: 09/03/2022 16:15      Impression / Plan:   LSHANNEL ZAHMis a 61y.o. y/o female with a history of ESRD, Type 1, history of recurrent C. difficile diarrhea s/p stool transplant [facility] history of dysphagia and chronic diarrhea was set up for an EGD and colonoscopy on 09/05/2022 admitted with nausea vomiting and possibly diarrhea poor historian.  CT scan shows features of fluid overload but no acute GI pathology.  Today she denies any nausea vomiting or diarrhea has mild abdominal discomfort her symptoms began 1 to 2 days prior to admission history and presentation fitting with acute gastroenteritis.  Resolving with conservative measures.  Stool was tested positive for C. difficile via PCR but since she is having no diarrhea do not recommend any treatment.  She has evidence of fluid overload and hence would not rush to do any endoscopy procedure as there is fluid around her lungs on the CT scan.  Will continue conservative management with fluids and encourage oral intake and the patient can be seen by Dr. Virgina Jock tomorrow to determine if procedure should be done as an inpatient or rescheduled as an outpatient.  If she were to have diarrhea would recommend treatment for C. difficile and  consider repeat stool transplant as an outpatient  Thank you for involving me in the care of this patient.      LOS: 0 days   Robin Bellows, MD  09/05/2022, 7:21 AM

## 2022-09-05 NOTE — TOC Progression Note (Signed)
Transition of Care [facility]) - Progression Note    Patient Details  Name: [name] MRN: [id number] Date of Birth: 1960-12-09  Transition of Care [facility]) CM/SW [place], RN Phone Number: 09/05/2022, 11:17 AM  Clinical Narrative:    The patient is a known dialysis patient  Per MD notes Will continue conservative management with fluids and encourage oral intake and the patient can be seen by Dr. Virgina Jock tomorrow to determine if procedure should be done as an inpatient or rescheduled as an outpatient.     TOC to monitor for needs    Expected Discharge Plan and Services                                                 Social Determinants of Health (SDOH) Interventions    Readmission Risk Interventions    05/03/2021    3:02 PM 12/11/2020    9:23 AM 09/21/2020    9:39 AM  Readmission Risk Prevention Plan  Transportation Screening Complete Complete Complete  Medication Review [employer]) Complete Complete Complete  PCP or Specialist appointment within 3-5 days of discharge  Complete Complete  HRI or Home Care Consult  Complete Complete  SW Recovery Care/Counseling Consult  Complete Complete  Palliative Care Screening Complete Not Applicable Complete  [place]  Not Complete Complete  SNF Comments  working on placement

## 2022-09-06 DIAGNOSIS — R112 Nausea with vomiting, unspecified: Secondary | ICD-10-CM | POA: Diagnosis not present

## 2022-09-06 LAB — BASIC METABOLIC PANEL
Anion gap: 7 (ref 5–15)
BUN: 5 mg/dL — ABNORMAL LOW (ref 8–23)
CO2: 30 mmol/L (ref 22–32)
Calcium: 7.9 mg/dL — ABNORMAL LOW (ref 8.9–10.3)
Chloride: 94 mmol/L — ABNORMAL LOW (ref 98–111)
Creatinine, Ser: 3.75 mg/dL — ABNORMAL HIGH (ref 0.44–1.00)
GFR, Estimated: 13 mL/min — ABNORMAL LOW (ref >60)
Glucose, Bld: 84 mg/dL (ref 70–99)
Potassium: 3.5 mmol/L (ref 3.5–5.1)
Sodium: 131 mmol/L — ABNORMAL LOW (ref 135–145)

## 2022-09-06 LAB — GLUCOSE, CAPILLARY
Glucose-Capillary: 67 mg/dL — ABNORMAL LOW (ref 70–99)
Glucose-Capillary: 86 mg/dL (ref 70–99)
Glucose-Capillary: 89 mg/dL (ref 70–99)
Glucose-Capillary: 98 mg/dL (ref 70–99)
Glucose-Capillary: 171 mg/dL — ABNORMAL HIGH (ref 70–99)

## 2022-09-06 LAB — CBC
HCT: 27.3 % — ABNORMAL LOW (ref 36.0–46.0)
Hemoglobin: 8.7 g/dL — ABNORMAL LOW (ref 12.0–15.0)
MCH: 25.7 pg — ABNORMAL LOW (ref 26.0–34.0)
MCHC: 31.9 g/dL (ref 30.0–36.0)
MCV: 80.8 fL (ref 80.0–100.0)
Platelets: 118 10*3/uL — ABNORMAL LOW (ref 150–400)
RBC: 3.38 MIL/uL — ABNORMAL LOW (ref 3.87–5.11)
RDW: 20.3 % — ABNORMAL HIGH (ref 11.5–15.5)
WBC: 4.9 10*3/uL (ref 4.0–10.5)
nRBC: 0 % (ref 0.0–0.2)

## 2022-09-06 LAB — HEPATITIS B SURFACE ANTIGEN: Hepatitis B Surface Ag: NONREACTIVE

## 2022-09-06 LAB — HEPATITIS B CORE ANTIBODY, TOTAL: Hep B Core Total Ab: REACTIVE — AB

## 2022-09-06 MED ORDER — PROMETHAZINE HCL 25 MG PO TABS
12.5000 mg | ORAL_TABLET | Freq: Four times a day (QID) | ORAL | Status: DC
  Administered 2022-09-06 – 2022-09-15 (×28): 12.5 mg via ORAL
  Filled 2022-09-06 (×42): qty 1

## 2022-09-06 NOTE — Progress Notes (Signed)
Patient refused her medications including the heparin.  PA is aware.

## 2022-09-06 NOTE — Progress Notes (Signed)
Pt did not tolerate HD. Ended treatment early AMA after 20 minutes. Alert, vss, anxious, agitated, c/o nausea and dry heaves. Report to primary RN and MD notified.  Start: 0936 End: 1000 No fluid removed No meds given 6.9L BVP 78.1kg post hd bed weight

## 2022-09-06 NOTE — Plan of Care (Signed)

## 2022-09-06 NOTE — Progress Notes (Signed)
Post hd rn assessment 

## 2022-09-06 NOTE — Evaluation (Signed)
Occupational Therapy Evaluation Patient Details Name: [name] MRN: [id number] DOB: 11-24-1960 Today's Date: 09/06/2022   History of Present Illness Pt is a 61 year old female admitted with intractable nausea and vomiting, potential viral acute gastritis; PMH significant for end-stage renal disease on hemodialysis on MWF, asthma, osteoarthritis, chronic diastolic CHF, COPD, coronary artery disease, type 2 diabetes mellitus, gastroparesis, GERD, diverticulosis and peripheral neuropathy   Clinical Impression   Chart reviewed, nurse cleared pt for participation in OT evaluation. Pt is oriented to self, extremely HOH and current discomfort level impedes participation. Home set up/PLOF from chart, will need to clarify. Pt requests mobility/reposition in the bed requiring MAX A for bed mobility, MAX A for rolling, TOTAL A for peri care after BM. Pt presents with deficits in strength, endurance, activity tolerance, balance all affecting safe and optimal ADL completino. Recommend discharge to STR pending progress to address functional deficits. OT will continue to follow acutely.      Recommendations for follow up therapy are one component of a multi-disciplinary discharge planning process, led by the attending physician.  Recommendations may be updated based on patient status, additional functional criteria and insurance authorization.   Follow Up Recommendations  Skilled nursing-short term rehab (<3 hours/day)     Assistance Recommended at Discharge Frequent or constant Supervision/Assistance  Patient can return home with the following A lot of help with walking and/or transfers;A lot of help with bathing/dressing/bathroom    Functional Status Assessment  Patient has had a recent decline in their functional status and demonstrates the ability to make significant improvements in function in a reasonable and predictable amount of time.  Equipment Recommendations  Other (comment) (per next venue  of care)    Recommendations for Other Services       Precautions / Restrictions Precautions Precautions: Fall      Mobility Bed Mobility Overal bed mobility: Needs Assistance Bed Mobility: Supine to Sit, Sit to Supine     Supine to sit: Max assist Sit to supine: Max assist        Transfers                          Balance Overall balance assessment: Needs assistance Sitting-balance support: Feet supported Sitting balance-Leahy Scale: Fair                                     ADL either performed or assessed with clinical judgement   ADL Overall ADL's : Needs assistance/impaired     Grooming: Wash/dry face;Sitting;Minimal assistance           Upper Body Dressing : Moderate assistance;Bed level   Lower Body Dressing: Maximal assistance     Toilet Transfer Details (indicate cue type and reason): unsafe to attempt on this date Toileting- Clothing Manipulation and Hygiene: Total assistance;Bed level               Vision Patient Visual Report: No change from baseline       Perception     Praxis      Pertinent Vitals/Pain Pain Assessment Pain Assessment: Faces Faces Pain Scale: Hurts whole lot Pain Location: stomach Pain Descriptors / Indicators: Aching, Discomfort Pain Intervention(s): Limited activity within patient's tolerance, Monitored during session, Repositioned (RN present)     Hand Dominance     Extremity/Trunk Assessment Upper Extremity Assessment Upper Extremity Assessment: Generalized weakness  Lower Extremity Assessment Lower Extremity Assessment: Generalized weakness       Communication Communication Communication: No difficulties   Cognition Arousal/Alertness: Awake/alert Behavior During Therapy: Anxious Overall Cognitive Status: No family/caregiver present to determine baseline cognitive functioning Area of Impairment: Orientation, Attention, Following commands, Safety/judgement, Awareness,  Problem solving                 Orientation Level: Disoriented to, Situation ("i have the flu") Current Attention Level: Focused   Following Commands: Follows one step commands with increased time Safety/Judgement: Decreased awareness of deficits Awareness: Intellectual Problem Solving: Slow processing, Decreased initiation, Requires verbal cues, Requires tactile cues       General Comments  pt reporting feeling very uncomortable and cold; extremely HOH    Exercises     Shoulder Instructions      Home Living Family/patient expects to be discharged to:: Private residence Living Arrangements: Alone Available Help at Discharge: Family;Available PRN/intermittently;Friend(s) Type of Home: Apartment Home Access: Level entry     Home Layout: One level     Bathroom Shower/Tub: Teacher, early years/pre: Standard     Home Equipment: Conservation officer, nature (2 wheels);Cane - single point;Wheelchair Probation officer (4 wheels);BSC/3in1;Cane - quad   Additional Comments: information from prior admission, pt unable to provide any PLOF/home set up on this date      Prior Functioning/Environment Prior Level of Function : Needs assist             Mobility Comments: amb with AD, will need to clairfy ADLs Comments: pt reports she is MOD I in ADL, assit for IADL, will need to clarify PLOF        OT Problem List: Decreased strength;Decreased activity tolerance;Decreased safety awareness;Impaired balance (sitting and/or standing);Decreased knowledge of use of DME or AE      OT Treatment/Interventions: Self-care/ADL training;Patient/family education;Therapeutic exercise;Energy conservation;Therapeutic activities;Balance training;DME and/or AE instruction    OT Goals(Current goals can be found in the care plan section) Acute Rehab OT Goals Patient Stated Goal: rest OT Goal Formulation: With patient Time For Goal Achievement: 09/20/22 Potential to Achieve Goals:  Good ADL Goals Pt Will Perform Grooming: with supervision;sitting Pt Will Perform Lower Body Dressing: with supervision;sit to/from stand Pt Will Transfer to Toilet: with supervision Pt Will Perform Toileting - Clothing Manipulation and hygiene: with supervision  OT Frequency: Min 2X/week    Co-evaluation              AM-PAC OT "6 Clicks" Daily Activity     Outcome Measure Help from another person eating meals?: A Little Help from another person taking care of personal grooming?: A Little Help from another person toileting, which includes using toliet, bedpan, or urinal?: Total Help from another person bathing (including washing, rinsing, drying)?: Total Help from another person to put on and taking off regular upper body clothing?: A Little Help from another person to put on and taking off regular lower body clothing?: A Lot 6 Click Score: 13   End of Session Nurse Communication: Mobility status  Activity Tolerance: Patient tolerated treatment well Patient left: in bed;with call bell/phone within reach;with bed alarm set;with nursing/sitter in room  OT Visit Diagnosis: Muscle weakness (generalized) (M62.81)                Time: 9562-1308 OT Time Calculation (min): 23 min Charges:  OT General Charges $OT Visit: 1 Visit OT Evaluation $OT Eval Moderate Complexity: 1 Mod Shanon Payor, OTD OTR/L  09/06/22, 3:20 PM

## 2022-09-06 NOTE — Progress Notes (Addendum)
Progress Note   Patient: [name] ZOX:[id number] DOB: Jun 11, 1961 DOA: 09/03/2022     2 DOS: the patient was seen and examined on 09/07/2022   Brief hospital course: LENIYAH BETO is a 61 y.o. female with medical history significant for end-stage renal disease on hemodialysis on MWF, asthma, osteoarthritis, chronic diastolic CHF, COPD, coronary artery disease, type 2 diabetes mellitus, gastroparesis, GERD, diverticulosis and peripheral neuropathy, who presented to the ER 09/03/2022 for evaluation of intractable nausea and vomiting since evening before.  No bilious vomitus or hematemesis.  She admits to epigastric abdominal pain and denies any diarrhea.   CT abdomen/pelvis in the ED was without acute findings. It showed anasarca and small bilateral pleural effusions.  Gastric wall thickening which could indicate gastritis (vs chronic).  Abdominopelvic adenopathy mildly improved from prior, suspected reactive.  Admitted to the hospital for further evaluation and management.  Started on supportive care with IV antiemetics, IV Protonix.  Clear liquid diet to allow GI tract to rest.    Nephrology consulted for dialysis.  Assessment and Plan: * Intractable nausea and vomiting Likely Acute gastritis that could be viral. Continue supportive care   IV antiemetics, IV PPI.   Heart healthy diet as tolerated. GI consulted - see their recommnedations. There was planned EGD/C-scope as outpatient 12/15. Pt is too high risk to tolerate sedation, therefore no procedures are planned. Will schedule PO Phenergan and monitor.  Type 1 diabetes mellitus with hypoglycemia without coma, without long-term current use of insulin (HCC) Hypoglycemia due to N/V and lack of PO intake. --Hypoglycemic protocol --Continue D10W at 50 cc /hr --Hold off insulin for now  Essential hypertension Continue her antihypertensives. PRN IV hydralazine ordered.  End-stage renal disease on hemodialysis [facility]) Nephrology consulted  for dialysis. Dialyzed 12/14.  Dyslipidemia Continue statin  Peripheral neuropathy Continue Neurontin.  Exocrine pancreatic insufficiency Follows with GI. Continue Creon  Recurrent Clostridioides difficile infection Not likely active infection.  She is followed by GI for recurrent C diff and had repeat stool studies on 12/3 which were antigen positive, toxin negative, but PCR positive. Pt has intractable N/V but has denied recent diarrhea.  No treatment indicated at this time.    Gastroparesis due to DM [facility]) Documented allergy to Reglan. Not on motility agent. PO Phenergan scheduled (as of 12/16 to monitor improvement in N/V, dry heaves). GI is following.        Subjective: Pt was sleeping very soundly when seen this AM. She woke briefly but did not engage with me.  She is extremely hard of hearing but says her hearing aids don't help.  Dialysis stopped after only about 20 minutes this AM, pt did not tolerate it today.  Having ongoing N/V but not producing significant emesis, only small frothy saliva and dry heaves.  Had to turn up D10 drip with recurrent hypoglycemia this AM.   Physical Exam: Vitals:   09/06/22 1004 09/06/22 1006 09/06/22 1611 09/07/22 0518  BP: (!) 160/64  (!) 167/59 (!) 139/54  Pulse: 73  66 66  Resp: 16  17 18   Temp:   98.6 F (37 C) 98.2 F (36.8 C)  TempSrc:      SpO2: 100%  100% 100%  Weight:  78.1 kg    Height:       General exam: sleeping soundly, no acute distress, chronically ill-appearing HEENT: moist mucus membranes, very hard of hearing Respiratory system: lungs clear diminished bases normal respiratory effort. Cardiovascular system: normal S1/S2, RRR.   Gastrointestinal system:  abdomen soft non-distended Central nervous system: no gross focal neurologic deficits, normal speech Extremities: BLE venous stasis and skin wrinkling consistent with prior edema Psychiatry: normal mood, flat affect, unable to assess judgement and insight due  to minimal interaction   Data Reviewed:  Notable labs ---   Na 131, Cl 94, BUN 5, Cr 3.75, Ca 7.9, Hbg 8.7, platelets 118k  Recent C diff antigen positive, PCR positive, toxin negative.   Pt not having diarrhea but has history of recurrent C diff.  GI recommended no treatment.    Family Communication: None  Disposition: Status is: Inpatient  Remains admitted due to ongoing N/V, not tolerating PO intake, on D10w drip to prevent recurrent hypoglycemia.  GI evaluation is pending.  She is not medically ready for discharge.     Planned Discharge Destination: Home    Time spent: 35 minutes  Author: Pennie Banter, DO 09/07/2022 8:59 AM  For on call review [email or web address].

## 2022-09-06 NOTE — Progress Notes (Signed)
Inpatient Follow-up/Progress Note   Patient ID: [name] is a 61 y.o. female.  Overnight Events / Subjective Findings NAEON. Pt scheduled for HD today. Discussed patient with nursing. Pt has been nauseous, but during my evaluation "vomiting" consists of spitting into emesis bag. Nursing has not noted any fulminant vomiting.  Per nursing, the patient has not been able to tolerate much PO intake.  Otherwise resting comfortably.  Patient is EXTREMELY hard of hearing as she does not have her hearing aids.  Review of Systems  Constitutional:  Positive for appetite change and chills. Negative for activity change, diaphoresis, fatigue, fever and unexpected weight change.  HENT:  Negative for trouble swallowing and voice change.   Respiratory:  Negative for shortness of breath and wheezing.   Cardiovascular:  Negative for chest pain, palpitations and leg swelling.  Gastrointestinal:  Positive for diarrhea, nausea and vomiting. Negative for abdominal distention, abdominal pain, anal bleeding, blood in stool, constipation and rectal pain.  Musculoskeletal:  Negative for arthralgias and myalgias.  Skin:  Negative for color change and pallor.  Neurological:  Negative for dizziness, syncope and weakness.  Psychiatric/Behavioral:  Negative for confusion.   All other systems reviewed and are negative.    Medications  Current Facility-Administered Medications:    acetaminophen (TYLENOL) tablet 650 mg, 650 mg, Oral, Q6H PRN, 650 mg at 09/04/22 1754 **OR** acetaminophen (TYLENOL) suppository 650 mg, 650 mg, Rectal, Q6H PRN, Mansy, Jan A, MD   acidophilus (RISAQUAD) capsule 1 capsule, 1 capsule, Oral, Daily, Mansy, Jan A, MD, 1 capsule at 09/05/22 1115   albuterol (PROVENTIL) (2.5 MG/3ML) 0.083% nebulizer solution 3 mL, 3 mL, Inhalation, Q4H PRN, Mansy, Jan A, MD   alteplase (CATHFLO ACTIVASE) injection 2 mg, 2 mg, Intracatheter, Once PRN, Colon Flattery, NP   alum & mag hydroxide-simeth  (MAALOX/MYLANTA) 200-200-20 MG/5ML suspension 15 mL, 15 mL, Oral, Q6H PRN, Mansy, Jan A, MD   amLODipine (NORVASC) tablet 10 mg, 10 mg, Oral, Daily, Mansy, Jan A, MD, 10 mg at 09/05/22 1115   anticoagulant sodium citrate solution 5 mL, 5 mL, Intracatheter, PRN, Colon Flattery, NP   Chlorhexidine Gluconate Cloth 2 % PADS 6 each, 6 each, Topical, Daily, Mansy, Jan A, MD, 6 each at 09/05/22 1146   cholecalciferol (VITAMIN D3) 25 MCG (1000 UNIT) tablet 1,000 Units, 1,000 Units, Oral, Daily, Mansy, Jan A, MD, 1,000 Units at 09/05/22 1115   cyanocobalamin (VITAMIN B12) tablet 1,000 mcg, 1,000 mcg, Oral, Daily, Mansy, Jan A, MD, 1,000 mcg at 09/05/22 1115   dextrose 10 % infusion, , Intravenous, Continuous, Ezekiel Slocumb, DO, Last Rate: 50 mL/hr at 09/05/22 1915, New Bag at 09/05/22 1915   feeding supplement (NEPRO CARB STEADY) liquid 237 mL, 237 mL, Oral, BID BM, Mansy, Jan A, MD   ferrous sulfate tablet 325 mg, 325 mg, Oral, Daily, Mansy, Jan A, MD, 325 mg at 09/05/22 1115   fluticasone (FLONASE) 50 MCG/ACT nasal spray 2 spray, 2 spray, Each Nare, Daily, Mansy, Jan A, MD   furosemide (LASIX) tablet 80 mg, 80 mg, Oral, Daily, Mansy, Jan A, MD, 80 mg at 09/05/22 1115   heparin injection 1,000 Units, 1,000 Units, Intracatheter, PRN, Colon Flattery, NP   heparin injection 5,000 Units, 5,000 Units, Subcutaneous, Q8H, Mansy, Jan A, MD, 5,000 Units at 09/05/22 1448   hydrALAZINE (APRESOLINE) injection 10 mg, 10 mg, Intravenous, Q4H PRN, Nicole Kindred A, DO   hydrALAZINE (APRESOLINE) tablet 50 mg, 50 mg, Oral, BID, Mansy, Jan A, MD, 50 mg  at 09/05/22 1115   HYDROcodone-acetaminophen (NORCO/VICODIN) 5-325 MG per tablet 1 tablet, 1 tablet, Oral, Q8H, Mansy, Jan A, MD, 1 tablet at 09/05/22 1448   hydrOXYzine (ATARAX) tablet 25 mg, 25 mg, Oral, TID PRN, Mansy, Jan A, MD   insulin aspart (novoLOG) injection 0-5 Units, 0-5 Units, Subcutaneous, QHS, Mansy, Jan A, MD   insulin aspart (novoLOG) injection 0-9  Units, 0-9 Units, Subcutaneous, TID WC, Mansy, Jan A, MD   lidocaine (PF) (XYLOCAINE) 1 % injection 5 mL, 5 mL, Intradermal, PRN, Gwyneth Revels, Shantelle, NP   lidocaine-prilocaine (EMLA) cream 1 Application, 1 Application, Topical, PRN, Colon Flattery, NP   lipase/protease/amylase (CREON) capsule 72,000 Units, 72,000 Units, Oral, TID WC, 72,000 Units at 09/05/22 1800 **AND** lipase/protease/amylase (CREON) capsule 36,000 Units, 36,000 Units, Oral, PRN, Renda Rolls, RPH   meclizine (ANTIVERT) tablet 25 mg, 25 mg, Oral, Q6H PRN, Mansy, Jan A, MD   mirtazapine (REMERON) tablet 7.5 mg, 7.5 mg, Oral, QHS, Mansy, Jan A, MD, 7.5 mg at 09/04/22 2138   multivitamin (RENA-VIT) tablet 1 tablet, 1 tablet, Oral, Daily, Mansy, Jan A, MD, 1 tablet at 09/05/22 1115   Oral care mouth rinse, 15 mL, Mouth Rinse, PRN, Nicole Kindred A, DO   pantoprazole (PROTONIX) injection 40 mg, 40 mg, Intravenous, Q12H, Mansy, Jan A, MD, 40 mg at 09/05/22 1115   pentafluoroprop-tetrafluoroeth (GEBAUERS) aerosol 1 Application, 1 Application, Topical, PRN, Colon Flattery, NP   polyethylene glycol (MIRALAX / GLYCOLAX) packet 17 g, 17 g, Oral, Daily PRN, Mansy, Jan A, MD   promethazine (PHENERGAN) 12.5 mg in sodium chloride 0.9 % 50 mL IVPB, 12.5 mg, Intravenous, Once in dialysis, Colon Flattery, NP   promethazine (PHENERGAN) tablet 12.5 mg, 12.5 mg, Oral, Q6H PRN, Mansy, Jan A, MD, 12.5 mg at 09/05/22 1824   sevelamer carbonate (RENVELA) tablet 800 mg, 800 mg, Oral, TID WC, 800 mg at 09/05/22 1800 **AND** sevelamer carbonate (RENVELA) tablet 800 mg, 800 mg, Oral, Daily PRN, Renda Rolls, RPH   traZODone (DESYREL) tablet 25 mg, 25 mg, Oral, QHS PRN, Mansy, Jan A, MD  anticoagulant sodium citrate     dextrose 50 mL/hr at 09/05/22 1915   promethazine (PHENERGAN) injection (IM or IVPB)      acetaminophen **OR** acetaminophen, albuterol, alteplase, alum & mag hydroxide-simeth, anticoagulant sodium citrate, heparin,  hydrALAZINE, hydrOXYzine, lidocaine (PF), lidocaine-prilocaine, lipase/protease/amylase **AND** lipase/protease/amylase, meclizine, mouth rinse, pentafluoroprop-tetrafluoroeth, polyethylene glycol, promethazine, sevelamer carbonate **AND** sevelamer carbonate, traZODone   Objective    Vitals:   09/04/22 1933 09/05/22 0843 09/05/22 1831 09/05/22 2200  BP: (!) 126/50 (!) 147/60 (!) 145/56 (!) 164/62  Pulse: 63 61 71 70  Resp: '20 16 16 20  '$ Temp: 97.7 F (36.5 C) 97.7 F (36.5 C) 97.6 F (36.4 C) 98.5 F (36.9 C)  TempSrc:      SpO2: 94% 100% 100% 100%  Weight:      Height:         Physical Exam Vitals and nursing note reviewed.  Constitutional:      General: She is not in acute distress.    Appearance: She is ill-appearing (chronically). She is not toxic-appearing or diaphoretic.  HENT:     Head: Normocephalic and atraumatic.     Ears:     Comments: Extremely hard of hearing    Nose: Nose normal.     Mouth/Throat:     Mouth: Mucous membranes are moist.     Pharynx: Oropharynx is clear.  Eyes:     General: No scleral icterus.  Extraocular Movements: Extraocular movements intact.  Cardiovascular:     Rate and Rhythm: Normal rate and regular rhythm.     Heart sounds: Normal heart sounds. No murmur heard.    No friction rub. No gallop.  Pulmonary:     Effort: Pulmonary effort is normal. No respiratory distress.     Breath sounds: Normal breath sounds. No wheezing, rhonchi or rales.  Abdominal:     General: Abdomen is flat. Bowel sounds are normal. There is no distension.     Palpations: Abdomen is soft.     Tenderness: There is no abdominal tenderness. There is no guarding or rebound.  Musculoskeletal:     Cervical back: Neck supple.     Right lower leg: No edema.     Left lower leg: No edema.  Skin:    General: Skin is warm and dry.     Coloration: Skin is not jaundiced or pale.  Neurological:     General: No focal deficit present.     Mental Status: She is alert  and oriented to person, place, and time. Mental status is at baseline.  Psychiatric:        Mood and Affect: Mood normal.        Behavior: Behavior normal.        Thought Content: Thought content normal.        Judgment: Judgment normal.      Laboratory Data Recent Labs  Lab 09/03/22 1313 09/04/22 0408  WBC 4.1 4.6  HGB 8.9* 9.2*  HCT 28.0* 27.7*  PLT 110* 115*   Recent Labs  Lab 09/03/22 1313 09/04/22 0232  NA 136 133*  K 3.1* 4.3  CL 97* 99  CO2 32 27  BUN 7* 7*  CREATININE 4.02* 4.25*  CALCIUM 7.8* 7.4*  PROT 6.5  --   BILITOT 0.6  --   ALKPHOS 76  --   ALT 9  --   AST 25  --   GLUCOSE 84 190*   No results for input(s): "INR" in the last 168 hours.    Imaging Studies: No results found.  Assessment:   # ESRD on HD  # nausea and vomiting - episodes frequently occurring with HD - has not trialed much to eat  # Gastroenteritis  # hypoglycemia  # gastroparesis  # pancreatic insufficiency  # chronic diarrhea  # DM1  # anasarca with pleural effusion  # generalized debility  # h/o colon polyps- adenomatous (last performed in 2017)  # h/o recurrent c diff s/p FMT (2016)  Plan:  Pt would not be able to tolerate colonoscopy prep at this time given her current symptom burden. As her scheduled CSY was for colon polyp surveillance, this can be deferred to outpatient unless additional issues arise.  In setting of volume overload and acute illness, would like to avoid sedation at this time from respiratory standpoint. Plan for ugi study with esophagram with radiology return on Monday to evaluate for further acute issues.  Consider trial of reglan or erythromycin in setting of gastroparesis with persistent nausea  Encourage small, frequent meals with focus on liquids ie- ensure/boost to prevent exacerbation of gastroparesis sx  Suspect ESRD with HD playing role in nausea as well given sx are exacerbated by this per notation.  No current plan for  inpatient endoscopy at this time, however, will continue to monitor  Anti emetics and supportive care as per primary team  I personally performed the service.  Management of other medical comorbidities  as per primary team  Thank you for allowing [place] to participate in this patient's care. Please don't hesitate to call if any questions or concerns arise.   Annamaria Helling, DO [facility]  Portions of the record may have been created with voice recognition software. Occasional wrong-word or 'sound-a-like' substitutions may have occurred due to the inherent limitations of voice recognition software.  Read the chart carefully and recognize, using context, where substitutions may have occurred.

## 2022-09-06 NOTE — Progress Notes (Signed)
Transport delay of patient to [employer].

## 2022-09-06 NOTE — Progress Notes (Signed)
Pre hd rn assessment 

## 2022-09-06 NOTE — Plan of Care (Signed)
  Problem: Coping: Goal: Ability to adjust to condition or change in health will improve Outcome: Progressing   Problem: Skin Integrity: Goal: Risk for impaired skin integrity will decrease Outcome: Progressing   Problem: Education: Goal: Knowledge of General Education information will improve Description: Including pain rating scale, medication(s)/side effects and non-pharmacologic comfort measures Outcome: Progressing   Problem: Coping: Goal: Level of anxiety will decrease Outcome: Progressing   Problem: Elimination: Goal: Will not experience complications related to bowel motility Outcome: Progressing

## 2022-09-06 NOTE — Progress Notes (Signed)
Pt in [place] for HD treatment experiencing nausea and vomiting. Agitated and irritable w/ staff. Support provided.

## 2022-09-06 NOTE — Progress Notes (Addendum)
[facility]  PROGRESS NOTE   Subjective:   Patient seen at bedside.  Events at the dialysis unit noted. Patient was restless and complained of severe nausea and did not want dialysis done today. Patient is presently in the room feeling better.  Objective:  Vital signs: Blood pressure (!) 160/64, pulse 73, temperature (!) 97.5 F (36.4 C), temperature source Oral, resp. rate 16, height '5\' 5"'$  (1.651 m), weight 78.1 kg, SpO2 100 %.  Intake/Output Summary (Last 24 hours) at 09/06/2022 1205 Last data filed at 09/06/2022 1000 Gross per 24 hour  Intake 40 ml  Output 0 ml  Net 40 ml   Filed Weights   09/04/22 1332 09/06/22 0923 09/06/22 1006  Weight: 76.4 kg 78.1 kg 78.1 kg     Physical Exam: General:  No acute distress  Head:  Normocephalic, atraumatic. Moist oral mucosal membranes  Eyes:  Anicteric  Neck:  Supple  Lungs:   Clear to auscultation, normal effort  Heart:  S1S2 no rubs  Abdomen:   Soft, nontender, bowel sounds present  Extremities:  peripheral edema.  Neurologic:  Awake, alert, following commands  Skin:  No lesions  Access:     Basic Metabolic Panel: Recent Labs  Lab 09/03/22 1313 09/04/22 0232 09/06/22 0947  NA 136 133* 131*  K 3.1* 4.3 3.5  CL 97* 99 94*  CO2 32 27 30  GLUCOSE 84 190* 84  BUN 7* 7* 5*  CREATININE 4.02* 4.25* 3.75*  CALCIUM 7.8* 7.4* 7.9*  MG 1.9  --   --     CBC: Recent Labs  Lab 09/03/22 1313 09/04/22 0408 09/06/22 0947  WBC 4.1 4.6 4.9  HGB 8.9* 9.2* 8.7*  HCT 28.0* 27.7* 27.3*  MCV 81.4 78.9* 80.8  PLT 110* 115* 118*     Urinalysis: No results for input(s): "COLORURINE", "LABSPEC", "PHURINE", "GLUCOSEU", "HGBUR", "BILIRUBINUR", "KETONESUR", "PROTEINUR", "UROBILINOGEN", "NITRITE", "LEUKOCYTESUR" in the last 72 hours.  Invalid input(s): "APPERANCEUR"    Imaging: No results found.   Medications:    anticoagulant sodium citrate     dextrose 75 mL/hr at 09/06/22 0841    acidophilus  1 capsule  Oral Daily   amLODipine  10 mg Oral Daily   Chlorhexidine Gluconate Cloth  6 each Topical Daily   cholecalciferol  1,000 Units Oral Daily   cyanocobalamin  1,000 mcg Oral Daily   feeding supplement (NEPRO CARB STEADY)  237 mL Oral BID BM   ferrous sulfate  325 mg Oral Daily   fluticasone  2 spray Each Nare Daily   furosemide  80 mg Oral Daily   heparin injection (subcutaneous)  5,000 Units Subcutaneous Q8H   hydrALAZINE  50 mg Oral BID   HYDROcodone-acetaminophen  1 tablet Oral Q8H   insulin aspart  0-5 Units Subcutaneous QHS   insulin aspart  0-9 Units Subcutaneous TID WC   lipase/protease/amylase  72,000 Units Oral TID WC   mirtazapine  7.5 mg Oral QHS   multivitamin  1 tablet Oral Daily   pantoprazole (PROTONIX) IV  40 mg Intravenous Q12H   sevelamer carbonate  800 mg Oral TID WC    Assessment/ Plan:     Principal Problem:   Intractable nausea and vomiting Active Problems:   End-stage renal disease on hemodialysis (HCC)   Essential hypertension   Dyslipidemia   Peripheral neuropathy   Type 1 diabetes mellitus with hypoglycemia without coma, without long-term current use of insulin (HCC)   Recurrent Clostridioides difficile infection   Exocrine pancreatic insufficiency  [name] is a 61 y.o., female with past medical history of anemia, asthma, diabetes, CAD, PAD, GERD, and end stage renal disease on dialysis.  Patient arrived to emergency department via EMS with complaints of abdominal pain.  Patient has been admitted for Generalized abdominal pain [R10.84] Intractable nausea and vomiting [R11.2] Gastritis without bleeding, unspecified chronicity, unspecified gastritis type [K29.70]   Patient is known to our practice and receives outpatient dialysis treatments at [facility] on a TTS schedule, supervised by Dr. Holley Raring.    #1: End-stage renal disease: Patient has been on the TTS schedule for dialysis.  Today she complains of severe nausea and aborted her treatment  after 20 minutes today.  Labs and medications reviewed.  GI note appreciated.  #2: Diabetes/nausea/vomiting: Patient may have diabetic gastroparesis I like to start her on Reglan at 5 mg twice a day.  But she seems to have allergies to Reglan.  Will monitor labs.  #3: Secondary hyperparathyroidism: Will continue to monitor PTH, calcium and phosphate levels.  Continue sevelamer for now.  #4: Anemia: Will continue to monitor closely.  Continue the anemia protocol.  #5: Hypertension/congestive heart failure: Medications and labs reviewed.  Advised on importance of 2 g salt restricted diet with fluid restriction.  Will follow.   LOS: [place], MD [facility] 12/16/202312:05 PM

## 2022-09-06 NOTE — Progress Notes (Signed)
Pt suddenly and repeatedly yelling "Take me off, stop my treatment!" HD ended, pt rinsed back. Pt experiencing nausea and dry heaves. Small amount of emesis (89m). Pt continues to yell "take me off" to [facility]. Support provided. MD notified.

## 2022-09-07 DIAGNOSIS — E10649 Type 1 diabetes mellitus with hypoglycemia without coma: Secondary | ICD-10-CM | POA: Diagnosis not present

## 2022-09-07 DIAGNOSIS — R112 Nausea with vomiting, unspecified: Secondary | ICD-10-CM | POA: Diagnosis not present

## 2022-09-07 DIAGNOSIS — E1143 Type 2 diabetes mellitus with diabetic autonomic (poly)neuropathy: Secondary | ICD-10-CM | POA: Diagnosis not present

## 2022-09-07 DIAGNOSIS — N186 End stage renal disease: Secondary | ICD-10-CM | POA: Diagnosis not present

## 2022-09-07 DIAGNOSIS — K3184 Gastroparesis: Secondary | ICD-10-CM

## 2022-09-07 LAB — MAGNESIUM: Magnesium: 1.5 mg/dL — ABNORMAL LOW (ref 1.7–2.4)

## 2022-09-07 LAB — BASIC METABOLIC PANEL
Anion gap: 6 (ref 5–15)
BUN: 7 mg/dL — ABNORMAL LOW (ref 8–23)
CO2: 28 mmol/L (ref 22–32)
Calcium: 7.5 mg/dL — ABNORMAL LOW (ref 8.9–10.3)
Chloride: 94 mmol/L — ABNORMAL LOW (ref 98–111)
Creatinine, Ser: 4.18 mg/dL — ABNORMAL HIGH (ref 0.44–1.00)
GFR, Estimated: 11 mL/min — ABNORMAL LOW (ref >60)
Glucose, Bld: 154 mg/dL — ABNORMAL HIGH (ref 70–99)
Potassium: 3.8 mmol/L (ref 3.5–5.1)
Sodium: 128 mmol/L — ABNORMAL LOW (ref 135–145)

## 2022-09-07 LAB — GLUCOSE, CAPILLARY
Glucose-Capillary: 140 mg/dL — ABNORMAL HIGH (ref 70–99)
Glucose-Capillary: 125 mg/dL — ABNORMAL HIGH (ref 70–99)
Glucose-Capillary: 189 mg/dL — ABNORMAL HIGH (ref 70–99)
Glucose-Capillary: 121 mg/dL — ABNORMAL HIGH (ref 70–99)

## 2022-09-07 LAB — HEPATITIS B SURFACE ANTIBODY, QUANTITATIVE: Hep B S AB Quant (Post): >1000 m[IU]/mL (ref >9.9)

## 2022-09-07 LAB — PHOSPHORUS: Phosphorus: 2.4 mg/dL — ABNORMAL LOW (ref 2.5–4.6)

## 2022-09-07 MED ORDER — MAGNESIUM SULFATE 2 GM/50ML IV SOLN
2.0000 g | Freq: Once | INTRAVENOUS | Status: AC
  Administered 2022-09-07: 2 g via INTRAVENOUS
  Filled 2022-09-07: qty 50

## 2022-09-07 MED ORDER — METOCLOPRAMIDE HCL 5 MG/ML IJ SOLN
10.0000 mg | Freq: Four times a day (QID) | INTRAMUSCULAR | Status: DC
  Administered 2022-09-07 – 2022-09-14 (×26): 10 mg via INTRAVENOUS
  Filled 2022-09-07 (×28): qty 2

## 2022-09-07 NOTE — Evaluation (Signed)
Physical Therapy Evaluation Patient Details Name: [name] MRN: [id number] DOB: 1961-03-19 Today's Date: 09/07/2022  History of Present Illness  Pt is a 61 y/o F admitted on 09/03/22 after presenting with c/o N&V. Pt is being treated for intractable N&V, likely acute gastritis that could be viral. PMH: ESRD on HD MWF, asthma, OA, chronic diastolic CHF, COPD, CAD, DM2, gastroparesis, GERD, diverticulosis, peripheral neuropathy  Clinical Impression  Pt seen for PT evaluation with pt agreeable. Pt very HOH so contacted pt's son via telephone to confirm PLOF/home set up information. Pt was living alone & ambulatory with various ADs. During PT evaluation, PT utilized written communication, which worked very well for communication. Pt was able to complete bed mobility & transfers with RW & min assist with environmental modifications (elevated EOB). Pt requires extra time for all mobility tasks. At this time, pt is unsafe to d/c home alone & would benefit from STR upon d/c to maximize independence with functional mobility & reduce fall risk prior to return home.   Recommendations for follow up therapy are one component of a multi-disciplinary discharge planning process, led by the attending physician.  Recommendations may be updated based on patient status, additional functional criteria and insurance authorization.  Follow Up Recommendations Skilled nursing-short term rehab (<3 hours/day) Can patient physically be transported by private vehicle: No    Assistance Recommended at Discharge Frequent or constant Supervision/Assistance  Patient can return home with the following  A lot of help with walking and/or transfers;A lot of help with bathing/dressing/bathroom;Assist for transportation;Assistance with cooking/housework;Help with stairs or ramp for entrance;Direct supervision/assist for medications management    Equipment Recommendations None recommended by PT  Recommendations for Other Services        Functional Status Assessment Patient has had a recent decline in their functional status and demonstrates the ability to make significant improvements in function in a reasonable and predictable amount of time.     Precautions / Restrictions Precautions Precautions: Fall Restrictions Weight Bearing Restrictions: No      Mobility  Bed Mobility Overal bed mobility: Needs Assistance Bed Mobility: Supine to Sit     Supine to sit: Min assist, HOB elevated     General bed mobility comments: extra time    Transfers Overall transfer level: Needs assistance Equipment used: Rolling walker (2 wheels) Transfers: Sit to/from Stand, Bed to chair/wheelchair/BSC Sit to Stand: Min assist   Step pivot transfers: Min assist       General transfer comment: STS from elevated EOB with RW with decreased awareness of safe hand placement, extra time to step/pivot to recliner.    Ambulation/Gait                  Stairs            Wheelchair Mobility    Modified Rankin (Stroke Patients Only)       Balance Overall balance assessment: Needs assistance Sitting-balance support: Feet supported Sitting balance-Leahy Scale: Good     Standing balance support: Bilateral upper extremity supported, Reliant on assistive device for balance, During functional activity Standing balance-Leahy Scale: Fair                               Pertinent Vitals/Pain Pain Assessment Pain Assessment: No/denies pain    Home Living Family/patient expects to be discharged to:: Private residence Living Arrangements: Alone Available Help at Discharge: Family;Available PRN/intermittently;Friend(s) Type of Home: House Home Access: Level  entry       Home Layout: One level Home Equipment: Conservation officer, nature (2 wheels);Cane - single point;Wheelchair Probation officer (4 wheels);BSC/3in1;Cane - quad      Prior Function               Mobility Comments: Per son, pt was  ambulatory with AD (would use SPC, RW, rollator) ADLs Comments: Meal preps, bathes, takes trash out.     Hand Dominance        Extremity/Trunk Assessment   Upper Extremity Assessment Upper Extremity Assessment: Generalized weakness (difficult to assess 2/2 HOH)    Lower Extremity Assessment Lower Extremity Assessment: Generalized weakness (difficult to assess 2/2 HOH)       Communication   Communication: HOH (extremely HOH, used written communication that worked well)  Cognition Arousal/Alertness: Awake/alert Behavior During Therapy: Flat affect Overall Cognitive Status: Difficult to assess                                          General Comments General comments (skin integrity, edema, etc.): Pt on 3L/min via nasal cannula. Pt would remove nasal cannula with PT strongly educating her on need to replace it.    Exercises     Assessment/Plan    PT Assessment Patient needs continued PT services  PT Problem List Decreased strength;Cardiopulmonary status limiting activity;Decreased activity tolerance;Decreased knowledge of use of DME;Decreased balance;Decreased safety awareness;Decreased mobility       PT Treatment Interventions Therapeutic exercise;DME instruction;Gait training;Balance training;Neuromuscular re-education;Stair training;Functional mobility training;Therapeutic activities;Patient/family education    PT Goals (Current goals can be found in the Care Plan section)  Acute Rehab PT Goals Patient Stated Goal: none stated PT Goal Formulation: With patient Time For Goal Achievement: 10/12/2022 Potential to Achieve Goals: Fair    Frequency Min 2X/week     Co-evaluation               AM-PAC PT "6 Clicks" Mobility  Outcome Measure Help needed turning from your back to your side while in a flat bed without using bedrails?: A Lot Help needed moving from lying on your back to sitting on the side of a flat bed without using bedrails?: A  Lot Help needed moving to and from a bed to a chair (including a wheelchair)?: A Little Help needed standing up from a chair using your arms (e.g., wheelchair or bedside chair)?: A Little Help needed to walk in hospital room?: A Lot Help needed climbing 3-5 steps with a railing? : Total 6 Click Score: 13    End of Session Equipment Utilized During Treatment: Oxygen Activity Tolerance: Patient limited by fatigue Patient left: in chair;with chair alarm set;with call bell/phone within reach Nurse Communication: Mobility status PT Visit Diagnosis: Muscle weakness (generalized) (M62.81);Difficulty in walking, not elsewhere classified (R26.2)    Time: 2536-6440 PT Time Calculation (min) (ACUTE ONLY): 30 min   Charges:   PT Evaluation $PT Eval High Complexity: 1 High          Lavone Nian, PT, DPT 09/07/22, 10:14 AM   [name] 09/07/2022, 10:14 AM

## 2022-09-07 NOTE — Progress Notes (Signed)
Inpatient Follow-up/Progress Note   Patient ID: [name] is a 61 y.o. female.  Overnight Events / Subjective Findings NAEON.  She did not tolerate dialysis yesterday.  Seems that her nausea worsened with this.  Continues to have multiple electrolyte abnormalities.  Still with nausea per patient report, but in discussion with nursing and limited to no vomiting.  Continues to be mostly spit into bag.  Patient notes that medication helps.  Was given Phenergan by nursing staff just prior to evaluation  Resting comfortably.  Patient is EXTREMELY hard of hearing as she does not have her hearing aids.  Review of Systems  Constitutional:  Positive for appetite change and chills. Negative for activity change, diaphoresis, fatigue, fever and unexpected weight change.  HENT:  Negative for trouble swallowing and voice change.   Respiratory:  Negative for shortness of breath and wheezing.   Cardiovascular:  Negative for chest pain, palpitations and leg swelling.  Gastrointestinal:  Positive for diarrhea, nausea and vomiting. Negative for abdominal distention, abdominal pain, anal bleeding, blood in stool, constipation and rectal pain.  Musculoskeletal:  Negative for arthralgias and myalgias.  Skin:  Negative for color change and pallor.  Neurological:  Negative for dizziness, syncope and weakness.  Psychiatric/Behavioral:  Negative for confusion.   All other systems reviewed and are negative.    Medications  Current Facility-Administered Medications:    acetaminophen (TYLENOL) tablet 650 mg, 650 mg, Oral, Q6H PRN, 650 mg at 09/04/22 1754 **OR** acetaminophen (TYLENOL) suppository 650 mg, 650 mg, Rectal, Q6H PRN, Mansy, Jan A, MD   acidophilus (RISAQUAD) capsule 1 capsule, 1 capsule, Oral, Daily, Mansy, Jan A, MD, 1 capsule at 09/05/22 1115   albuterol (PROVENTIL) (2.5 MG/3ML) 0.083% nebulizer solution 3 mL, 3 mL, Inhalation, Q4H PRN, Mansy, Jan A, MD   alteplase (CATHFLO ACTIVASE) injection  2 mg, 2 mg, Intracatheter, Once PRN, Colon Flattery, NP   alum & mag hydroxide-simeth (MAALOX/MYLANTA) 200-200-20 MG/5ML suspension 15 mL, 15 mL, Oral, Q6H PRN, Mansy, Jan A, MD   amLODipine (NORVASC) tablet 10 mg, 10 mg, Oral, Daily, Mansy, Jan A, MD, 10 mg at 09/05/22 1115   anticoagulant sodium citrate solution 5 mL, 5 mL, Intracatheter, PRN, Colon Flattery, NP   Chlorhexidine Gluconate Cloth 2 % PADS 6 each, 6 each, Topical, Daily, Mansy, Jan A, MD, 6 each at 09/06/22 0907   cholecalciferol (VITAMIN D3) 25 MCG (1000 UNIT) tablet 1,000 Units, 1,000 Units, Oral, Daily, Mansy, Jan A, MD, 1,000 Units at 09/05/22 1115   cyanocobalamin (VITAMIN B12) tablet 1,000 mcg, 1,000 mcg, Oral, Daily, Mansy, Jan A, MD, 1,000 mcg at 09/05/22 1115   dextrose 10 % infusion, , Intravenous, Continuous, Ezekiel Slocumb, DO, Last Rate: 75 mL/hr at 09/07/22 0845, New Bag at 09/07/22 0845   feeding supplement (NEPRO CARB STEADY) liquid 237 mL, 237 mL, Oral, BID BM, Mansy, Jan A, MD   ferrous sulfate tablet 325 mg, 325 mg, Oral, Daily, Mansy, Jan A, MD, 325 mg at 09/05/22 1115   fluticasone (FLONASE) 50 MCG/ACT nasal spray 2 spray, 2 spray, Each Nare, Daily, Mansy, Jan A, MD   furosemide (LASIX) tablet 80 mg, 80 mg, Oral, Daily, Mansy, Jan A, MD, 80 mg at 09/05/22 1115   heparin injection 1,000 Units, 1,000 Units, Intracatheter, PRN, Colon Flattery, NP   heparin injection 5,000 Units, 5,000 Units, Subcutaneous, Q8H, Mansy, Jan A, MD, 5,000 Units at 09/07/22 0547   hydrALAZINE (APRESOLINE) injection 10 mg, 10 mg, Intravenous, Q4H PRN, Ezekiel Slocumb,  DO   hydrALAZINE (APRESOLINE) tablet 50 mg, 50 mg, Oral, BID, Mansy, Jan A, MD, 50 mg at 09/06/22 2037   HYDROcodone-acetaminophen (NORCO/VICODIN) 5-325 MG per tablet 1 tablet, 1 tablet, Oral, Q8H, Mansy, Jan A, MD, 1 tablet at 09/07/22 0547   hydrOXYzine (ATARAX) tablet 25 mg, 25 mg, Oral, TID PRN, Mansy, Jan A, MD   insulin aspart (novoLOG) injection 0-5  Units, 0-5 Units, Subcutaneous, QHS, Mansy, Jan A, MD   insulin aspart (novoLOG) injection 0-9 Units, 0-9 Units, Subcutaneous, TID WC, Mansy, Jan A, MD, 1 Units at 09/07/22 0849   lidocaine (PF) (XYLOCAINE) 1 % injection 5 mL, 5 mL, Intradermal, PRN, Gwyneth Revels, Shantelle, NP   lidocaine-prilocaine (EMLA) cream 1 Application, 1 Application, Topical, PRN, Colon Flattery, NP   lipase/protease/amylase (CREON) capsule 72,000 Units, 72,000 Units, Oral, TID WC, 72,000 Units at 09/07/22 0841 **AND** lipase/protease/amylase (CREON) capsule 36,000 Units, 36,000 Units, Oral, PRN, Renda Rolls, RPH   magnesium sulfate IVPB 2 g 50 mL, 2 g, Intravenous, Once, Nicole Kindred A, DO   meclizine (ANTIVERT) tablet 25 mg, 25 mg, Oral, Q6H PRN, Mansy, Jan A, MD   mirtazapine (REMERON) tablet 7.5 mg, 7.5 mg, Oral, QHS, Mansy, Jan A, MD, 7.5 mg at 09/06/22 2037   multivitamin (RENA-VIT) tablet 1 tablet, 1 tablet, Oral, Daily, Mansy, Jan A, MD, 1 tablet at 09/05/22 1115   Oral care mouth rinse, 15 mL, Mouth Rinse, PRN, Nicole Kindred A, DO   pantoprazole (PROTONIX) injection 40 mg, 40 mg, Intravenous, Q12H, Mansy, Jan A, MD, 40 mg at 09/06/22 2036   pentafluoroprop-tetrafluoroeth (GEBAUERS) aerosol 1 Application, 1 Application, Topical, PRN, Colon Flattery, NP   polyethylene glycol (MIRALAX / GLYCOLAX) packet 17 g, 17 g, Oral, Daily PRN, Mansy, Jan A, MD   promethazine (PHENERGAN) tablet 12.5 mg, 12.5 mg, Oral, Q6H, Griffith, Kelly A, DO, 12.5 mg at 09/07/22 0841   sevelamer carbonate (RENVELA) tablet 800 mg, 800 mg, Oral, TID WC, 800 mg at 09/07/22 0842 **AND** sevelamer carbonate (RENVELA) tablet 800 mg, 800 mg, Oral, Daily PRN, Renda Rolls, RPH   traZODone (DESYREL) tablet 25 mg, 25 mg, Oral, QHS PRN, Mansy, Jan A, MD, 25 mg at 09/06/22 2037  anticoagulant sodium citrate     dextrose 75 mL/hr at 09/07/22 0845   magnesium sulfate bolus IVPB      acetaminophen **OR** acetaminophen, albuterol, alteplase,  alum & mag hydroxide-simeth, anticoagulant sodium citrate, heparin, hydrALAZINE, hydrOXYzine, lidocaine (PF), lidocaine-prilocaine, lipase/protease/amylase **AND** lipase/protease/amylase, meclizine, mouth rinse, pentafluoroprop-tetrafluoroeth, polyethylene glycol, sevelamer carbonate **AND** sevelamer carbonate, traZODone   Objective    Vitals:   09/06/22 1004 09/06/22 1006 09/06/22 1611 09/07/22 0518  BP: (!) 160/64  (!) 167/59 (!) 139/54  Pulse: 73  66 66  Resp: '16  17 18  '$ Temp:   98.6 F (37 C) 98.2 F (36.8 C)  TempSrc:      SpO2: 100%  100% 100%  Weight:  78.1 kg    Height:         Physical Exam Vitals and nursing note reviewed.  Constitutional:      General: She is not in acute distress.    Appearance: She is ill-appearing (chronically). She is not toxic-appearing or diaphoretic.  HENT:     Head: Normocephalic and atraumatic.     Ears:     Comments: Extremely hard of hearing    Nose: Nose normal.     Mouth/Throat:     Mouth: Mucous membranes are moist.     Pharynx: Oropharynx  is clear.  Eyes:     General: No scleral icterus.    Extraocular Movements: Extraocular movements intact.  Cardiovascular:     Rate and Rhythm: Normal rate and regular rhythm.     Heart sounds: Normal heart sounds. No murmur heard.    No friction rub. No gallop.  Pulmonary:     Effort: Pulmonary effort is normal. No respiratory distress.     Breath sounds: Normal breath sounds. No wheezing, rhonchi or rales.  Abdominal:     General: Abdomen is flat. Bowel sounds are normal. There is no distension.     Palpations: Abdomen is soft.     Tenderness: There is no abdominal tenderness. There is no guarding or rebound.  Musculoskeletal:     Cervical back: Neck supple.  Skin:    General: Skin is warm and dry.     Coloration: Skin is not jaundiced or pale.  Neurological:     General: No focal deficit present.     Mental Status: She is alert and oriented to person, place, and time. Mental status  is at baseline.  Psychiatric:        Mood and Affect: Mood normal.        Behavior: Behavior normal.        Thought Content: Thought content normal.        Judgment: Judgment normal.      Laboratory Data Recent Labs  Lab 09/03/22 1313 09/04/22 0408 09/06/22 0947  WBC 4.1 4.6 4.9  HGB 8.9* 9.2* 8.7*  HCT 28.0* 27.7* 27.3*  PLT 110* 115* 118*    Recent Labs  Lab 09/03/22 1313 09/04/22 0232 09/06/22 0947 09/07/22 0431  NA 136 133* 131* 128*  K 3.1* 4.3 3.5 3.8  CL 97* 99 94* 94*  CO2 32 '27 30 28  '$ BUN 7* 7* 5* 7*  CREATININE 4.02* 4.25* 3.75* 4.18*  CALCIUM 7.8* 7.4* 7.9* 7.5*  PROT 6.5  --   --   --   BILITOT 0.6  --   --   --   ALKPHOS 76  --   --   --   ALT 9  --   --   --   AST 25  --   --   --   GLUCOSE 84 190* 84 154*    No results for input(s): "INR" in the last 168 hours.    Imaging Studies: No results found.  Assessment:   # ESRD on HD  # persistent nausea -Suspect this is multifactorial with dialysis, electrolyte abnormalities, gastroparesis, and overall poor health status - episodes frequently occurring/exacerbated with HD - has not trialed much to eat - limited vomiting- mostly spitting into bag  # Gastroenteritis  # hypoglycemia  # multiple electrolyte abnormalities  # gastroparesis  # pancreatic insufficiency  # chronic diarrhea  # DM1  # anasarca with pleural effusion  # generalized debility  # h/o colon polyps- adenomatous (last performed in 2017)  # h/o recurrent c diff s/p FMT (2016)  Plan:  Patient with increased volume (not tolerating dialysis) and multiple electrolyte abnormalities  Some symptomatic control of nausea with medication (phenergan)  Listed allergy to Reglan in the chart is nonspecific and only states that her primary took her off the medication but not an actual issue.  Can consider trialing while she is here in the hospital and can be monitored closely. Short-term erythromycin also  consideration  Plan for esophagram/upper GI study to rule out structural defect tomorrow.  N.p.o.  at midnight for this  Pt would not be able to tolerate colonoscopy prep at this time given her current symptom burden. As her scheduled CSY was for colon polyp surveillance, this can be deferred to outpatient unless additional issues arise.  In setting of volume overload and acute illness, would like to avoid sedation at this time from respiratory standpoint. (Currently wearing nasal cannula)   Changed to gastroparesis-renal diet. Encourage small, frequent meals with focus on liquids ie- ensure/boost to prevent exacerbation of gastroparesis sx  No current plan for inpatient endoscopy at this time, however, will continue to monitor  Anti emetics and supportive care as per primary team  Dr. Vicente Males will be resuming GI care tomorrow (09/08/22) if further issues arise.  I personally performed the service.  Management of other medical comorbidities as per primary team  Thank you for allowing [place] to participate in this patient's care. Please don't hesitate to call if any questions or concerns arise.   Annamaria Helling, DO [facility]  Portions of the record may have been created with voice recognition software. Occasional wrong-word or 'sound-a-like' substitutions may have occurred due to the inherent limitations of voice recognition software.  Read the chart carefully and recognize, using context, where substitutions may have occurred.

## 2022-09-07 NOTE — Progress Notes (Addendum)
Progress Note   Patient: [name] DOB: Mar 17, 1961 DOA: 09/03/2022     2 DOS: the patient was seen and examined on 09/07/2022   Brief hospital course: [name] is a 61 y.o. female with medical history significant for end-stage renal disease on hemodialysis on MWF, asthma, osteoarthritis, chronic diastolic CHF, COPD, coronary artery disease, type 2 diabetes mellitus, gastroparesis, GERD, diverticulosis and peripheral neuropathy, who presented to the ER 09/03/2022 for evaluation of intractable nausea and vomiting since evening before.  No bilious vomitus or hematemesis.  She admits to epigastric abdominal pain and denies any diarrhea.   CT abdomen/pelvis in the ED was without acute findings. It showed anasarca and small bilateral pleural effusions.  Gastric wall thickening which could indicate gastritis (vs chronic).  Abdominopelvic adenopathy mildly improved from prior, suspected reactive.  Admitted to the hospital for further evaluation and management.  Started on supportive care with IV antiemetics, IV Protonix.  Clear liquid diet to allow GI tract to rest.    Nephrology consulted for dialysis.  Gastroenterology consulted for persistent N/V and several chronic GI issues with planned outpatient endoscopies scheduled.  12/17: pt seems improved on scheduled PO Phenergan today. Discussed with GI and will do challenge with Reglan to monitor for allergy vs intolerance given unclear allergy history.    Assessment and Plan: * Intractable nausea and vomiting Likely Acute gastritis that could be viral. Continue supportive care   IV antiemetics, IV PPI.   Heart healthy diet as tolerated. GI consulted - see their recommnedations. There was planned EGD/C-scope as outpatient 12/15. Pt is too high risk to tolerate sedation, therefore no procedures are planned. Continue scheduled PO Phenergan and monitor.  Type 1 diabetes mellitus with hypoglycemia without coma, without long-term  current use of insulin (HCC) Hypoglycemia due to N/V and lack of PO intake. --Hypoglycemic protocol --Continue D10W at 50 cc /hr --Hold off insulin for now  Essential hypertension Continue her antihypertensives. PRN IV hydralazine ordered.  End-stage renal disease on hemodialysis [facility]) Nephrology consulted for dialysis. Dialyzed 12/14.  Dyslipidemia Continue statin  Peripheral neuropathy Continue Neurontin.  Hyponatremia Na 131 >> 128 past two days.  Suspect due to progressive volume status due to not tolerating dialysis yesterday.  She's had poor PO intake as well, however. --Monitor BMP --Volume mgmt by dialysis --Further evaluation if not improved with volume removal next dialysis  Hypomagnesemia Replacing IV Mg-sulfate for Mg 1.5 (12/17). Monitor Mg level, replace PRN  Exocrine pancreatic insufficiency Follows with GI. Continue Creon  Recurrent Clostridioides difficile infection Not likely active infection.  She is followed by GI for recurrent C diff and had repeat stool studies on 12/3 which were antigen positive, toxin negative, but PCR positive. Pt has intractable N/V but has denied recent diarrhea.  No treatment indicated at this time.    Gastroparesis due to DM [facility]) Documented allergy to Reglan. Not on motility agent. PO Phenergan scheduled (as of 12/16 to monitor improvement in N/V, dry heaves). GI is following. Trial Reglan while here (documented allergy vs intolerance but remote & pt does not recall details)  12/17: N/V seems better with scheduled Phenergan. Start Reglan trial and monitor for side effects        Subjective: Pt was awake up in recliner this AM on rounds.  She appeared to be sleeping, woke easily.  She can only hear if you yell into her right ear.  Says she vomited only once earlier, emesis bag in lap but only a tissue in  it.    Physical Exam: Vitals:   09/06/22 1006 09/06/22 1611 09/07/22 0518 09/07/22 1012  BP:  (!) 167/59 (!)  139/54 (!) 139/52  Pulse:  66 66 64  Resp:  '17 18 16  '$ Temp:  98.6 F (37 C) 98.2 F (36.8 C) 98.3 F (36.8 C)  TempSrc:      SpO2:  100% 100% 100%  Weight: 78.1 kg     Height:       General exam: awake & alert, no acute distress, chronically ill-appearing HEENT: moist mucus membranes, very hard of hearing Respiratory system: lungs clear diminished bases, normal respiratory effort. Cardiovascular system: normal S1/S2, RRR.   Gastrointestinal system: abdomen soft non-distended, mild epigastric tenderness Central nervous system: no gross focal neurologic deficits, normal speech Extremities: severe BLE venous stasis and skin wrinkling consistent with prior edema Psychiatry: normal mood, flat affect   Data Reviewed:  Notable labs ---   Na 131, Cl 94, BUN 5, Cr 3.75, Ca 7.9, Hbg 8.7, platelets 118k  Recent C diff antigen positive, PCR positive, toxin negative.   Pt not having diarrhea but has history of recurrent C diff.  GI recommended no treatment.    Family Communication: None  Disposition: Status is: Inpatient  Remains admitted due to ongoing N/V, not tolerating PO intake, on D10w drip to prevent recurrent hypoglycemia.  GI evaluation is pending.  She is not medically ready for discharge.     Planned Discharge Destination: Home    Time spent: 35 minutes  Author: Ezekiel Slocumb, DO 09/07/2022 2:01 PM  For on call review [email or web address].

## 2022-09-07 NOTE — Assessment & Plan Note (Addendum)
Replacing IV Mg-sulfate for Mg 1.5 (12/17).  No further labs now that she is comfort care

## 2022-09-07 NOTE — Assessment & Plan Note (Addendum)
Felt to be secondary to progressive volume status and not tolerating dialysis prior to admission.  Currently at 130.  After being made comfort care, labs no longer checked.

## 2022-09-07 NOTE — Progress Notes (Signed)
[facility]  PROGRESS NOTE   Subjective:   Patient seen at bedside this morning.  Out of bed to chair.  Denies any chest pain or shortness of breath.  Less nauseous this morning.  Had partial dialysis yesterday.  Objective:  Vital signs: Blood pressure (!) 139/52, pulse 64, temperature 98.3 F (36.8 C), resp. rate 16, height '5\' 5"'$  (1.651 m), weight 78.1 kg, SpO2 100 %.  Intake/Output Summary (Last 24 hours) at 09/07/2022 1211 Last data filed at 09/06/2022 1521 Gross per 24 hour  Intake 2157.11 ml  Output --  Net 2157.11 ml   Filed Weights   09/04/22 1332 09/06/22 0923 09/06/22 1006  Weight: 76.4 kg 78.1 kg 78.1 kg     Physical Exam: General:  No acute distress  Head:  Normocephalic, atraumatic. Moist oral mucosal membranes  Eyes:  Anicteric  Neck:  Supple  Lungs:   Clear to auscultation, normal effort  Heart:  S1S2 no rubs  Abdomen:   Soft, nontender, bowel sounds present  Extremities:  peripheral edema.  Neurologic:  Awake, alert, following commands  Skin:  No lesions  Access:     Basic Metabolic Panel: Recent Labs  Lab 09/03/22 1313 09/04/22 0232 09/06/22 0947 09/07/22 0431  NA 136 133* 131* 128*  K 3.1* 4.3 3.5 3.8  CL 97* 99 94* 94*  CO2 32 '27 30 28  '$ GLUCOSE 84 190* 84 154*  BUN 7* 7* 5* 7*  CREATININE 4.02* 4.25* 3.75* 4.18*  CALCIUM 7.8* 7.4* 7.9* 7.5*  MG 1.9  --   --  1.5*  PHOS  --   --   --  2.4*    CBC: Recent Labs  Lab 09/03/22 1313 09/04/22 0408 09/06/22 0947  WBC 4.1 4.6 4.9  HGB 8.9* 9.2* 8.7*  HCT 28.0* 27.7* 27.3*  MCV 81.4 78.9* 80.8  PLT 110* 115* 118*     Urinalysis: No results for input(s): "COLORURINE", "LABSPEC", "PHURINE", "GLUCOSEU", "HGBUR", "BILIRUBINUR", "KETONESUR", "PROTEINUR", "UROBILINOGEN", "NITRITE", "LEUKOCYTESUR" in the last 72 hours.  Invalid input(s): "APPERANCEUR"    Imaging: No results found.   Medications:    anticoagulant sodium citrate     dextrose 75 mL/hr at 09/07/22 0845     acidophilus  1 capsule Oral Daily   amLODipine  10 mg Oral Daily   Chlorhexidine Gluconate Cloth  6 each Topical Daily   cholecalciferol  1,000 Units Oral Daily   cyanocobalamin  1,000 mcg Oral Daily   feeding supplement (NEPRO CARB STEADY)  237 mL Oral BID BM   fluticasone  2 spray Each Nare Daily   heparin injection (subcutaneous)  5,000 Units Subcutaneous Q8H   hydrALAZINE  50 mg Oral BID   HYDROcodone-acetaminophen  1 tablet Oral Q8H   insulin aspart  0-5 Units Subcutaneous QHS   insulin aspart  0-9 Units Subcutaneous TID WC   lipase/protease/amylase  72,000 Units Oral TID WC   mirtazapine  7.5 mg Oral QHS   multivitamin  1 tablet Oral Daily   pantoprazole (PROTONIX) IV  40 mg Intravenous Q12H   promethazine  12.5 mg Oral Q6H    Assessment/ Plan:     Principal Problem:   Intractable nausea and vomiting Active Problems:   End-stage renal disease on hemodialysis (HCC)   Gastroparesis due to DM (HCC)   Essential hypertension   Dyslipidemia   Peripheral neuropathy   Type 1 diabetes mellitus with hypoglycemia without coma, without long-term current use of insulin (HCC)   Recurrent Clostridioides difficile infection  Exocrine pancreatic insufficiency  Expand All Collapse All    [facility]  PROGRESS NOTE    Subjective:    Patient seen at bedside.  Events at the dialysis unit noted. Patient was restless and complained of severe nausea and did not want dialysis done today. Patient is presently in the room feeling better.   Objective:  Vital signs: Blood pressure (!) 160/64, pulse 73, temperature (!) 97.5 F (36.4 C), temperature source Oral, resp. rate 16, height '5\' 5"'$  (1.651 m), weight 78.1 kg, SpO2 100 %.   Intake/Output Summary (Last 24 hours) at 09/06/2022 1205 Last data filed at 09/06/2022 1000    Gross per 24 hour  Intake 40 ml  Output 0 ml  Net 40 ml         Filed Weights    09/04/22 1332 09/06/22 0923 09/06/22 1006  Weight: 76.4 kg  78.1 kg 78.1 kg        Physical Exam: General:  No acute distress  Head:  Normocephalic, atraumatic. Moist oral mucosal membranes  Eyes:  Anicteric  Neck:  Supple  Lungs:   Clear to auscultation, normal effort  Heart:  S1S2 no rubs  Abdomen:   Soft, nontender, bowel sounds present  Extremities:  peripheral edema.  Neurologic:  Awake, alert, following commands  Skin:  No lesions  Access:        Basic Metabolic Panel: Last Labs       Recent Labs  Lab 09/03/22 1313 09/04/22 0232 09/06/22 0947  NA 136 133* 131*  K 3.1* 4.3 3.5  CL 97* 99 94*  CO2 32 27 30  GLUCOSE 84 190* 84  BUN 7* 7* 5*  CREATININE 4.02* 4.25* 3.75*  CALCIUM 7.8* 7.4* 7.9*  MG 1.9  --   --         CBC: Last Labs       Recent Labs  Lab 09/03/22 1313 09/04/22 0408 09/06/22 0947  WBC 4.1 4.6 4.9  HGB 8.9* 9.2* 8.7*  HCT 28.0* 27.7* 27.3*  MCV 81.4 78.9* 80.8  PLT 110* 115* 118*          Urinalysis:  Recent Labs (last 2 labs)  No results for input(s): "COLORURINE", "LABSPEC", "PHURINE", "GLUCOSEU", "HGBUR", "BILIRUBINUR", "KETONESUR", "PROTEINUR", "UROBILINOGEN", "NITRITE", "LEUKOCYTESUR" in the last 72 hours.   Invalid input(s): "APPERANCEUR"        Imaging: Imaging Results (Last 48 hours)  No results found.       Medications:     anticoagulant sodium citrate     dextrose 75 mL/hr at 09/06/22 0841     acidophilus  1 capsule Oral Daily   amLODipine  10 mg Oral Daily   Chlorhexidine Gluconate Cloth  6 each Topical Daily   cholecalciferol  1,000 Units Oral Daily   cyanocobalamin  1,000 mcg Oral Daily   feeding supplement (NEPRO CARB STEADY)  237 mL Oral BID BM   ferrous sulfate  325 mg Oral Daily   fluticasone  2 spray Each Nare Daily   furosemide  80 mg Oral Daily   heparin injection (subcutaneous)  5,000 Units Subcutaneous Q8H   hydrALAZINE  50 mg Oral BID   HYDROcodone-acetaminophen  1 tablet Oral Q8H   insulin aspart  0-5 Units Subcutaneous QHS   insulin aspart  0-9  Units Subcutaneous TID WC   lipase/protease/amylase  72,000 Units Oral TID WC   mirtazapine  7.5 mg Oral QHS   multivitamin  1 tablet Oral Daily   pantoprazole (PROTONIX) IV  40 mg Intravenous Q12H   sevelamer carbonate  800 mg Oral TID WC      Assessment/ Plan:     Principal Problem:   Intractable nausea and vomiting Active Problems:   End-stage renal disease on hemodialysis (HCC)   Essential hypertension   Dyslipidemia   Peripheral neuropathy   Type 1 diabetes mellitus with hypoglycemia without coma, without long-term current use of insulin (HCC)   Recurrent Clostridioides difficile infection   Exocrine pancreatic insufficiency   [name] is a 61 y.o., female with past medical history of anemia, asthma, diabetes, CAD, PAD, GERD, and end stage renal disease on dialysis.  Patient arrived to emergency department via EMS with complaints of abdominal pain.  Patient has been admitted for Generalized abdominal pain [R10.84] Intractable nausea and vomiting [R11.2] Gastritis without bleeding, unspecified chronicity, unspecified gastritis type [K29.70]   Patient is known to our practice and receives outpatient dialysis treatments at [facility] on a TTS schedule, supervised by Dr. Holley Raring.     #1: End-stage renal disease: Patient has been on the TTS schedule for dialysis.  Today she complains of severe nausea and aborted her treatment after 20 minutes yesterday.  Will order dialysis for tomorrow.  Labs and medications reviewed.  GI note appreciated.   #2: Diabetes/nausea/vomiting: Patient may have diabetic gastroparesis.  GI managing nausea/vomiting.  Will monitor labs.   #3: Secondary hyperparathyroidism: Will continue to monitor PTH, calcium and phosphate levels.  Phosphorus levels are too low we will discontinue sevelamer for now.   #4: Anemia: Will continue to monitor closely.  Continue the anemia protocol.  Will stop the oral iron supplementation since patient has severe  nausea.   #5: Hypertension/congestive heart failure: Medications and labs reviewed.  Advised on importance of 2 g salt restricted diet with fluid restriction.   Will follow.     LOS: [place], MD [facility] 12/17/202312:11 PM

## 2022-09-07 NOTE — Assessment & Plan Note (Addendum)
Documented allergy to Reglan. Not on motility agent. PO Phenergan scheduled (as of 12/16 to monitor improvement in N/V, dry heaves). GI is following. Trial Reglan while here (documented allergy vs intolerance but remote & pt does not recall details)  Nausea and vomiting doing much better with Reglan and scheduled Phenergan.

## 2022-09-08 ENCOUNTER — Inpatient Hospital Stay: Payer: Medicare Other

## 2022-09-08 DIAGNOSIS — R112 Nausea with vomiting, unspecified: Secondary | ICD-10-CM | POA: Diagnosis not present

## 2022-09-08 LAB — BASIC METABOLIC PANEL
Anion gap: 6 (ref 5–15)
BUN: 9 mg/dL (ref 8–23)
CO2: 26 mmol/L (ref 22–32)
Calcium: 7.9 mg/dL — ABNORMAL LOW (ref 8.9–10.3)
Chloride: 93 mmol/L — ABNORMAL LOW (ref 98–111)
Creatinine, Ser: 4.66 mg/dL — ABNORMAL HIGH (ref 0.44–1.00)
GFR, Estimated: 10 mL/min — ABNORMAL LOW (ref >60)
Glucose, Bld: 76 mg/dL (ref 70–99)
Potassium: 3.6 mmol/L (ref 3.5–5.1)
Sodium: 125 mmol/L — ABNORMAL LOW (ref 135–145)

## 2022-09-08 LAB — GLUCOSE, CAPILLARY
Glucose-Capillary: 93 mg/dL (ref 70–99)
Glucose-Capillary: 95 mg/dL (ref 70–99)
Glucose-Capillary: 91 mg/dL (ref 70–99)

## 2022-09-08 LAB — CBC
HCT: 25.6 % — ABNORMAL LOW (ref 36.0–46.0)
Hemoglobin: 8.5 g/dL — ABNORMAL LOW (ref 12.0–15.0)
MCH: 25.8 pg — ABNORMAL LOW (ref 26.0–34.0)
MCHC: 33.2 g/dL (ref 30.0–36.0)
MCV: 77.8 fL — ABNORMAL LOW (ref 80.0–100.0)
Platelets: 152 10*3/uL (ref 150–400)
RBC: 3.29 MIL/uL — ABNORMAL LOW (ref 3.87–5.11)
RDW: 19.9 % — ABNORMAL HIGH (ref 11.5–15.5)
WBC: 5 10*3/uL (ref 4.0–10.5)
nRBC: 0 % (ref 0.0–0.2)

## 2022-09-08 LAB — MAGNESIUM: Magnesium: 1.8 mg/dL (ref 1.7–2.4)

## 2022-09-08 LAB — SODIUM: Sodium: 131 mmol/L — ABNORMAL LOW (ref 135–145)

## 2022-09-08 MED ORDER — ANTICOAGULANT SODIUM CITRATE 4% (200MG/5ML) IV SOLN: 5.0000 mL | Status: DC | PRN

## 2022-09-08 MED ORDER — CHLORHEXIDINE GLUCONATE CLOTH 2 % EX PADS: 6.0000 | MEDICATED_PAD | Freq: Every day | CUTANEOUS | Status: DC

## 2022-09-08 MED ORDER — PENTAFLUOROPROP-TETRAFLUOROETH EX AERO: 1.0000 | INHALATION_SPRAY | CUTANEOUS | Status: DC | PRN

## 2022-09-08 MED ORDER — HEPARIN SODIUM (PORCINE) 1000 UNIT/ML DIALYSIS: 1000.0000 [IU] | INTRAMUSCULAR | Status: DC | PRN

## 2022-09-08 MED ORDER — LIDOCAINE-PRILOCAINE 2.5-2.5 % EX CREA: 1.0000 | TOPICAL_CREAM | CUTANEOUS | Status: DC | PRN

## 2022-09-08 MED ORDER — HEPARIN SODIUM (PORCINE) 1000 UNIT/ML IJ SOLN
INTRAMUSCULAR | Status: AC
  Filled 2022-09-08: qty 10

## 2022-09-08 MED ORDER — ALTEPLASE 2 MG IJ SOLR: 2.0000 mg | Freq: Once | INTRAMUSCULAR | Status: DC | PRN

## 2022-09-08 MED ORDER — LIDOCAINE HCL (PF) 1 % IJ SOLN: 5.0000 mL | INTRAMUSCULAR | Status: DC | PRN

## 2022-09-08 NOTE — Progress Notes (Signed)
Patient had 1.58 hours of HD, tolerated well. She will go to Radiology department for her procedure right after HD. Per NP, patient will go back to her previous schedule of HD (T-TH-S).  UF: 400 ml    09/08/22 1049  Neurological  Level of Consciousness Alert  Orientation Level Oriented X4  Respiratory  Respiratory Pattern Regular  Chest Assessment Chest expansion symmetrical  R Upper  Breath Sounds Clear  L Upper Breath Sounds Clear  R Lower Breath Sounds Diminished  L Lower Breath Sounds Diminished  Cardiac  Pulse Regular  Heart Sounds S1, S2  Jugular Venous Distention (JVD) No  ECG Monitor Yes  Cardiac Rhythm NSR  Antiarrhythmic device No  Vascular  R Radial Pulse +2  L Radial Pulse +2  R Dorsalis Pedis Pulse +2  L Dorsalis Pedis Pulse +2  Edema Right upper extremity  RUE Edema Non-pitting  LUE Edema +1  RLE Edema +1  LLE Edema +1  Integumentary  Integumentary (WDL) X  Skin Color Appropriate for ethnicity  Skin Condition Dry;Flaky  Skin Integrity Other (Comment) (BLE venous stasis)  Ecchymosis Location Arm  Ecchymosis Location Orientation Bilateral  Musculoskeletal  Musculoskeletal (WDL) X  Generalized Weakness Yes  Gastrointestinal  Bowel Sounds Assessment Active  GU Assessment  Genitourinary (WDL) X  Genitourinary Symptoms Oliguria  Psychosocial  Psychosocial (WDL) WDL  Patient Behaviors Calm;Cooperative  Needs Expressed Denies  Emotional support given Given to patient

## 2022-09-08 NOTE — Progress Notes (Signed)
[facility]  ROUNDING NOTE   Subjective:   [name] is a 61 y.o., female with past medical history of anemia, asthma, diabetes, CAD, PAD, GERD, and end stage renal disease on dialysis.  Patient arrived to emergency department via EMS with complaints of abdominal pain.  Patient has been admitted for Generalized abdominal pain [R10.84] Intractable nausea and vomiting [R11.2] Gastritis without bleeding, unspecified chronicity, unspecified gastritis type [K29.70]  Patient is known to our practice and receives outpatient dialysis treatments at [facility] on a TTS schedule, supervised by Dr. Holley Raring.    Patient seen and evaluated during dialysis   HEMODIALYSIS FLOWSHEET:  Blood Flow Rate (mL/min): 400 mL/min Arterial Pressure (mmHg): -150 mmHg Venous Pressure (mmHg): 150 mmHg TMP (mmHg): -8 mmHg Ultrafiltration Rate (mL/min): 554 mL/min Dialysate Flow Rate (mL/min): 300 ml/min Dialysis Fluid Bolus: Normal Saline Bolus Amount (mL): 0 mL  Remains nauseated during treatment  Objective:  Vital signs in last 24 hours:  Temp:  [97.5 F (36.4 C)-97.6 F (36.4 C)] 97.6 F (36.4 C) (12/18 1216) Pulse Rate:  [60-71] 65 (12/18 1216) Resp:  [12-20] 14 (12/18 1216) BP: (105-155)/(41-60) 105/41 (12/18 1216) SpO2:  [100 %] 100 % (12/18 1216) Weight:  [81.5 kg] 81.5 kg (12/18 0834)  Weight change:  Filed Weights   09/06/22 0923 09/06/22 1006 09/08/22 0834  Weight: 78.1 kg 78.1 kg 81.5 kg    Intake/Output: No intake/output data recorded.   Intake/Output this shift:  Total I/O In: -  Out: 0.4 [Other:0.4]  Physical Exam: General: Ill-appearing  Head: Normocephalic, atraumatic.  Dry oral mucosal membranes  Eyes: Anicteric  Lungs:  Clear to auscultation. Normal effort, [place] O2  Heart: Regular rate and rhythm  Abdomen:  Soft, nontender  Extremities:  No peripheral edema.  Neurologic: Nonfocal, moving all four extremities  Skin: No lesions, dry  Access: Rt  permcath    Basic Metabolic Panel: Recent Labs  Lab 09/03/22 1313 09/04/22 0232 09/06/22 0947 09/07/22 0431 09/08/22 0549  NA 136 133* 131* 128* 125*  K 3.1* 4.3 3.5 3.8 3.6  CL 97* 99 94* 94* 93*  CO2 32 '27 30 28 26  '$ GLUCOSE 84 190* 84 154* 76  BUN 7* 7* 5* 7* 9  CREATININE 4.02* 4.25* 3.75* 4.18* 4.66*  CALCIUM 7.8* 7.4* 7.9* 7.5* 7.9*  MG 1.9  --   --  1.5* 1.8  PHOS  --   --   --  2.4*  --      Liver Function Tests: Recent Labs  Lab 09/03/22 1313  AST 25  ALT 9  ALKPHOS 76  BILITOT 0.6  PROT 6.5  ALBUMIN 2.7*     Recent Labs  Lab 09/03/22 1313  LIPASE 26     No results for input(s): "AMMONIA" in the last 168 hours.  CBC: Recent Labs  Lab 09/03/22 1313 09/04/22 0408 09/06/22 0947 09/08/22 0549  WBC 4.1 4.6 4.9 5.0  HGB 8.9* 9.2* 8.7* 8.5*  HCT 28.0* 27.7* 27.3* 25.6*  MCV 81.4 78.9* 80.8 77.8*  PLT 110* 115* 118* 152     Cardiac Enzymes: No results for input(s): "CKTOTAL", "CKMB", "CKMBINDEX", "TROPONINI" in the last 168 hours.   BNP: Invalid input(s): "POCBNP"  CBG: Recent Labs  Lab 09/07/22 0848 09/07/22 1220 09/07/22 1721 09/07/22 2030 09/08/22 1217  GLUCAP 140* 125* 189* 121* 87      Microbiology: Results for orders placed or performed during the hospital encounter of 08/24/22  Gastrointestinal Panel by PCR , Stool  Status: None   Collection Time: 08/24/22  4:20 PM   Specimen: Stool  Result Value Ref Range Status   Campylobacter species NOT DETECTED NOT DETECTED Final   Plesimonas shigelloides NOT DETECTED NOT DETECTED Final   Salmonella species NOT DETECTED NOT DETECTED Final   Yersinia enterocolitica NOT DETECTED NOT DETECTED Final   Vibrio species NOT DETECTED NOT DETECTED Final   Vibrio cholerae NOT DETECTED NOT DETECTED Final   Enteroaggregative E coli (EAEC) NOT DETECTED NOT DETECTED Final   Enteropathogenic E coli (EPEC) NOT DETECTED NOT DETECTED Final   Enterotoxigenic E coli (ETEC) NOT DETECTED NOT  DETECTED Final   Shiga like toxin producing E coli (STEC) NOT DETECTED NOT DETECTED Final   Shigella/Enteroinvasive E coli (EIEC) NOT DETECTED NOT DETECTED Final   Cryptosporidium NOT DETECTED NOT DETECTED Final   Cyclospora cayetanensis NOT DETECTED NOT DETECTED Final   Entamoeba histolytica NOT DETECTED NOT DETECTED Final   Giardia lamblia NOT DETECTED NOT DETECTED Final   Adenovirus F40/41 NOT DETECTED NOT DETECTED Final   Astrovirus NOT DETECTED NOT DETECTED Final   Norovirus GI/GII NOT DETECTED NOT DETECTED Final   Rotavirus A NOT DETECTED NOT DETECTED Final   Sapovirus (I, II, IV, and V) NOT DETECTED NOT DETECTED Final    Comment: Performed at [facility], [address] [place], [place] [address]  C Difficile Quick Screen w PCR reflex     Status: Abnormal   Collection Time: 08/24/22  4:20 PM   Specimen: STOOL  Result Value Ref Range Status   C Diff antigen POSITIVE (A) NEGATIVE Final   C Diff toxin NEGATIVE NEGATIVE Final   C Diff interpretation Results are indeterminate. See PCR results.  Final    Comment: Performed at [facility], [address] [place], [place] [address]  C. Diff by PCR, Reflexed     Status: Abnormal   Collection Time: 08/24/22  4:20 PM  Result Value Ref Range Status   Toxigenic C. Difficile by PCR POSITIVE (A) NEGATIVE Final    Comment: Positive for toxigenic C. difficile with little to no toxin production. Only treat if clinical presentation suggests symptomatic illness. Performed at [facility], [address] [place], [place] [address]     Coagulation Studies: No results for input(s): "LABPROT", "INR" in the last 72 hours.   Urinalysis: No results for input(s): "COLORURINE", "LABSPEC", "PHURINE", "GLUCOSEU", "HGBUR", "BILIRUBINUR", "KETONESUR", "PROTEINUR", "UROBILINOGEN", "NITRITE", "LEUKOCYTESUR" in the last 72 hours.  Invalid input(s): "APPERANCEUR"    Imaging: DG UGI W SINGLE CM (SOL OR THIN BA)  Result  Date: 09/08/2022 CLINICAL DATA:  Patient presents for single-contrast upper GI series due to intractable vomiting. EXAM: UPPER GI SERIES WITHOUT KUB TECHNIQUE: Routine upper GI series was performed with thin density barium. FLUOROSCOPY: Radiation Exposure Index (as provided by the fluoroscopic device): 64.5 mGy Kerma COMPARISON:  CT 08/24/2022 FINDINGS: Patient presents for a limited single contrast upper GI series. Note that this is a severely limited exam due to patient's difficulty following instructions as well as severe limited mobility. Exam could only be performed with patient in the semi erect supine position only. Thin barium was given orally. Esophagus is normal in course, contour and caliber without evidence of focal mass or stricture. No hiatal hernia. No aspiration. There is dysmotility with mild-to-moderate stasis of the barium column within the esophagus. Gastroesophageal junction is patent. Multiple tertiary contractions are evident. Contrast will not move distal to the gastric fundus. Attempt was made to roll patient on  her right side unsuccessfully. After 10 minutes, contrast had not moved the on the gastric fundus into the mid to distal stomach. There is no evidence gastric peristalsis during real-time fluoroscopic observation which may be due significant gastroparesis. Procedure was terminated at this point. IMPRESSION: 1. Severely limited single contrast upper GI series as described. Moderate esophageal dysmotility as described. 2. No evidence of gastric peristalsis during real-time fluoroscopic observation which may be due to significant gastroparesis as contrast would not move distal to the gastric fundus. Electronically Signed   By: Marin Olp M.D.   On: 09/08/2022 11:56     Medications:    dextrose 30 mL/hr at 09/08/22 1252     acidophilus  1 capsule Oral Daily   amLODipine  10 mg Oral Daily   Chlorhexidine Gluconate Cloth  6 each Topical Daily   cholecalciferol  1,000 Units  Oral Daily   cyanocobalamin  1,000 mcg Oral Daily   feeding supplement (NEPRO CARB STEADY)  237 mL Oral BID BM   fluticasone  2 spray Each Nare Daily   heparin injection (subcutaneous)  5,000 Units Subcutaneous Q8H   heparin sodium (porcine)       hydrALAZINE  50 mg Oral BID   HYDROcodone-acetaminophen  1 tablet Oral Q8H   insulin aspart  0-5 Units Subcutaneous QHS   insulin aspart  0-9 Units Subcutaneous TID WC   lipase/protease/amylase  72,000 Units Oral TID WC   metoCLOPramide (REGLAN) injection  10 mg Intravenous Q6H   mirtazapine  7.5 mg Oral QHS   multivitamin  1 tablet Oral Daily   pantoprazole (PROTONIX) IV  40 mg Intravenous Q12H   promethazine  12.5 mg Oral Q6H   acetaminophen **OR** acetaminophen, albuterol, alum & mag hydroxide-simeth, heparin sodium (porcine), hydrALAZINE, hydrOXYzine, lipase/protease/amylase **AND** lipase/protease/amylase, meclizine, mouth rinse, polyethylene glycol, traZODone  Assessment/ Plan:  [name] is a 60 y.o.  female with past medical history of anemia, asthma, diabetes, CAD, PAD, GERD, and end stage renal disease on dialysis.  Patient arrived to emergency department via EMS after being found at home by grandson with multiple space heaters deterrent to high setting and patient wrapped in heated blanket.  Patient has been admitted for Generalized abdominal pain [R10.84] Intractable nausea and vomiting [R11.2] Gastritis without bleeding, unspecified chronicity, unspecified gastritis type [K29.70]  [facility]/TTS/Rt Permcath  End-stage renal disease on hemodialysis.    Patient received 20 minutes of scheduled treatment on Saturday.  Patient received a make-up treatment today.  However, treatment terminated early due to GI procedure.  Patient will receive scheduled treatment tomorrow to maintain outpatient schedule.  2. Anemia of chronic kidney disease Lab Results  Component Value Date   HGB 8.5 (L) 09/08/2022    Patient receives  [place] outpatient.  Hemoglobin below desired goal.  Will continue to monitor for now.  May consider low-dose EPO with next treatment.  3. Secondary Hyperparathyroidism: with outpatient labs: PTH 485, phosphorus 3.9, calcium 8.5 on 06/03/22.   Lab Results  Component Value Date   PTH 357 (H) 10/05/2018   CALCIUM 7.9 (L) 09/08/2022   CAION 1.09 (L) 04/13/2020   PHOS 2.4 (L) 09/07/2022   Calcium and phosphorus decreased.  Agree with holding sevelamer here until oral intake improves.  4. Diabetes mellitus type II with chronic kidney disease/renal manifestations:noninsulin dependent.   Primary team to manage sliding scale insulin.  5.  Hypertension with chronic kidney disease.     Currently prescribed amlodipine, furosemide, and hydralazine.  Blood pressure  105/41 today.     LOS: 3   12/18/20232:47 PM

## 2022-09-08 NOTE — Care Management Important Message (Signed)
Important Message  Patient Details  Name: [name] MRN: [id number] Date of Birth: 1961/08/30   Medicare Important Message Given:  N/A - LOS <3 / Initial given by admissions     [name] A Randye Treichler 09/08/2022, 9:59 AM

## 2022-09-08 NOTE — NC FL2 (Signed)
[place] LEVEL OF CARE FORM     IDENTIFICATION  Patient Name: [name] Birthdate: 1961-05-08 Sex: female Admission Date (Current Location): 09/03/2022  [facility] and [place] Number:  Engineering geologist and Address:  [facility], [address] [place], [place] [address]      Provider Number: [id number]  Attending Physician Name and Address:  Ezekiel Slocumb, DO  Relative Name and Phone Number:  Donnice, Nielsen [phone]    Current Level of Care: Hospital Recommended Level of Care: [place] Prior Approval Number:    Date Approved/Denied:   PASRR Number: [id number]  Discharge Plan: SNF    Current Diagnoses: Patient Active Problem List   Diagnosis Date Noted   Hypomagnesemia 09/07/2022   Recurrent Clostridioides difficile infection 09/04/2022   Exocrine pancreatic insufficiency 09/04/2022   Intractable nausea and vomiting 09/03/2022   Essential hypertension 09/03/2022   Dyslipidemia 09/03/2022   Peripheral neuropathy 09/03/2022   Type 1 diabetes mellitus with hypoglycemia without coma, without long-term current use of insulin ([place]) 09/03/2022   Debility 07/04/2022   Acute colitis 07/02/2022   Venous ulcer of ankle, right ([place]) 06/30/2022   Pericardial effusion 12/05/2021   Hearing impairment 12/04/2021   Hyponatremia 11/27/2021   COVID-19 virus infection 11/27/2021   Bacterial pneumonia 11/27/2021   Acute GI bleeding 11/15/2021   Upper GI bleed 11/15/2021   Anemia of chronic kidney failure 05/03/2021   Thrombocytopenia ([place]) 05/03/2021   Severe sepsis with septic shock ([place]) 04/25/2021   HCAP (healthcare-associated pneumonia) 46/28/6381   Acute metabolic encephalopathy 77/07/6578   UTI (urinary tract infection) 03/29/2021   Chronic respiratory failure with hypoxia ([place]) 12/18/2020   Superior vena caval syndrome 12/14/2020   Chronic diastolic CHF (congestive heart failure) ([place]) 12/04/2020    Bradycardia 09/17/2020   Advanced care planning/counseling discussion    Discitis of thoracic region    Fluid overload 08/10/2020   HLD (hyperlipidemia) 08/10/2020   Chest pain 08/10/2020   CAD (coronary artery disease) 08/10/2020   GERD (gastroesophageal reflux disease) 08/10/2020   Obesity (BMI 30-39.9) 06/06/2020   Anemia in ESRD (end-stage renal disease) ([place]) 05/31/2020   Type 1 diabetes mellitus ([place]) 05/31/2020   End-stage renal disease ([place]) 05/31/2020   Hypertensive disorder 05/31/2020   Arm pain 05/21/2020   Cellulitis of right leg 05/02/2020   ESRD (end stage renal disease) ([place]) 04/13/2020   SOBOE (shortness of breath on exertion) 02/08/2020   Hematoma of arm, left, subsequent encounter 11/24/2019   Respiratory failure (HCC)    Shortness of breath    Complication of arteriovenous dialysis fistula 11/13/2019   PVD (peripheral vascular disease) ([place]) 11/13/2019   Severe anemia 11/13/2019   Pressure ulcer, stage 2 ([place]) 11/13/2019   Hemorrhagic shock ([place]) 11/11/2019   Leg pain 10/02/2019   CAP (community acquired pneumonia) 10/01/2019   Atherosclerosis of native arteries of extremity with intermittent claudication ([place]) 05/01/2019   Right-sided headache    COPD (chronic obstructive pulmonary disease) ([place]) 10/06/2018   Syncope 10/04/2018   Symptomatic anemia 06/18/2018   Gastroparesis due to DM ([place]) 03/83/3383   Complication of vascular access for dialysis 12/04/2017   Osteomyelitis ([place]) 09/30/2017   Carotid stenosis 06/18/2017   Bilateral carotid artery stenosis 06/16/2017   Benign essential HTN 05/19/2017   CKD (chronic kidney disease) stage 5, GFR less than 15 ml/min ([place]) 05/19/2017   Hyperlipidemia, mixed 05/19/2017   Shortness of breath 05/04/2017   Rash, skin 12/16/2016   Cellulitis of lower  extremity 07/29/2016   Chronic venous insufficiency 07/29/2016   Lymphedema 07/29/2016   TIA (transient ischemic attack) 04/21/2016   Altered mental status  04/08/2016   Hyperammonemia ([place]) 04/08/2016   Elevated troponin 04/08/2016   Depression 04/08/2016   Depression, major, recurrent, severe with psychosis ([place]) 04/08/2016   Blood in stool    Intractable cyclical vomiting with nausea    Reflux esophagitis    Gastritis    Generalized abdominal pain    Uncontrollable vomiting    Major depressive disorder, recurrent episode, moderate ([place]) 03/15/2016   Adjustment disorder with mixed anxiety and depressed mood 03/15/2016   Malnutrition of moderate degree 12/01/2015   Renal mass    Dyspnea    Acute renal failure (HCC)    Respiratory failure ([place])    Fever 11/14/2015   Encounter for central line placement    Encounter for orogastric (OG) tube placement    Nausea 11/12/2015   Hyperkalemia 10/03/2015   Diarrhea, unspecified 07/22/2015   Hypoglycemia 04/24/2015   Unresponsiveness 04/24/2015   Sinus bradycardia 04/24/2015   Hypothermia 04/24/2015   Acute hypoxemic respiratory failure ([place]) 04/24/2015   Acute on chronic diastolic CHF (congestive heart failure) ([place]) 04/05/2015   Diabetic gastroparesis ([place]) 04/05/2015   Hypokalemia 04/05/2015   Physical debility 04/05/2015   Acute pulmonary edema ([place]) 04/03/2015   Nausea and vomiting 04/03/2015   Hypoglycemia associated with diabetes ([place]) 04/03/2015   Anemia of chronic disease 04/03/2015   Secondary hyperparathyroidism ([place]) 04/03/2015   Pressure ulcer 04/02/2015   Acute respiratory failure with hypoxia (HCC) 04/01/2015   Adjustment disorder with anxiety 03/14/2015   Somatic symptom disorder, mild 03/08/2015   Coronary artery disease involving native coronary artery of native heart without angina pectoris    Nausea & vomiting 03/06/2015   Abdominal pain 03/06/2015   Hypoglycemia in type 2 diabetes mellitus , without long-term current use of insulin ([place]) 03/06/2015   HTN (hypertension) 03/06/2015   Gastroparesis 02/24/2015   Pleural effusion 02/19/2015   Bacteremia due to  Enterococcus 02/19/2015   End-stage renal disease on hemodialysis ([place]) 02/19/2015   Diarrhea 08/01/2014    Orientation RESPIRATION BLADDER Height & Weight     Self, Time, Situation, Place  Normal Continent Weight: 81.5 kg Height:  '5\' 5"'$  (165.1 cm)  BEHAVIORAL SYMPTOMS/MOOD NEUROLOGICAL BOWEL NUTRITION STATUS      Continent Diet (See dc summary)  AMBULATORY STATUS COMMUNICATION OF NEEDS Skin   Extensive Assist Verbally Normal                       Personal Care Assistance Level of Assistance  Bathing, Feeding, Dressing Bathing Assistance: Limited assistance Feeding assistance: Limited assistance Dressing Assistance: Maximum assistance     Functional Limitations Info             SPECIAL CARE FACTORS FREQUENCY  PT (By licensed PT), OT (By licensed OT)     PT Frequency: 5 times per week OT Frequency: 5 times per week            Contractures Contractures Info: Not present    Additional Factors Info   ([place]   [address]  [place], [place] [address]  [phone]     Schedule Days:  Tuesday Thursday and Saturday      Treatment Time: 6:45am) Code Status Info: full code Allergies Info: Ace Inhibitors, Ativan (Lorazepam), Compazine (Prochlorperazine Edisylate), Sumatriptan Succinate, Zofran (Ondansetron), Losartan, Prochlorperazine, Reglan (Metoclopramide), Scopolamine, Tape, Tapentadol, Ultrasound Gel  Current Medications (09/08/2022):  This is the current hospital active medication list Current Facility-Administered Medications  Medication Dose Route Frequency Provider Last Rate Last Admin   acetaminophen (TYLENOL) tablet 650 mg  650 mg Oral Q6H PRN Mansy, Jan A, MD   650 mg at 09/04/22 1754   Or   acetaminophen (TYLENOL) suppository 650 mg  650 mg Rectal Q6H PRN Mansy, Jan A, MD       acidophilus (RISAQUAD) capsule 1 capsule  1 capsule Oral Daily Mansy, Jan A, MD   1 capsule at 09/08/22 1251   albuterol (PROVENTIL) (2.5  MG/3ML) 0.083% nebulizer solution 3 mL  3 mL Inhalation Q4H PRN Mansy, Jan A, MD       alum & mag hydroxide-simeth (MAALOX/MYLANTA) 200-200-20 MG/5ML suspension 15 mL  15 mL Oral Q6H PRN Mansy, Jan A, MD       amLODipine (NORVASC) tablet 10 mg  10 mg Oral Daily Mansy, Jan A, MD   10 mg at 09/07/22 1055   Chlorhexidine Gluconate Cloth 2 % PADS 6 each  6 each Topical Daily Mansy, Jan A, MD   6 each at 09/07/22 1055   cholecalciferol (VITAMIN D3) 25 MCG (1000 UNIT) tablet 1,000 Units  1,000 Units Oral Daily Mansy, Jan A, MD   1,000 Units at 09/08/22 1252   cyanocobalamin (VITAMIN B12) tablet 1,000 mcg  1,000 mcg Oral Daily Mansy, Jan A, MD   1,000 mcg at 09/08/22 1252   dextrose 10 % infusion   Intravenous Continuous Colon Flattery, NP 30 mL/hr at 09/08/22 1252 Rate Change at 09/08/22 1252   feeding supplement (NEPRO CARB STEADY) liquid 237 mL  237 mL Oral BID BM Mansy, Jan A, MD   237 mL at 09/07/22 1054   fluticasone (FLONASE) 50 MCG/ACT nasal spray 2 spray  2 spray Each Nare Daily Mansy, Jan A, MD   2 spray at 09/08/22 1253   heparin injection 5,000 Units  5,000 Units Subcutaneous Q8H Mansy, Jan A, MD   5,000 Units at 09/08/22 0513   heparin sodium (porcine) 1000 UNIT/ML injection            hydrALAZINE (APRESOLINE) injection 10 mg  10 mg Intravenous Q4H PRN Nicole Kindred A, DO       hydrALAZINE (APRESOLINE) tablet 50 mg  50 mg Oral BID Mansy, Jan A, MD   50 mg at 09/07/22 2122   HYDROcodone-acetaminophen (NORCO/VICODIN) 5-325 MG per tablet 1 tablet  1 tablet Oral Q8H Mansy, Jan A, MD   1 tablet at 09/07/22 2122   hydrOXYzine (ATARAX) tablet 25 mg  25 mg Oral TID PRN Mansy, Jan A, MD       insulin aspart (novoLOG) injection 0-5 Units  0-5 Units Subcutaneous QHS Mansy, Jan A, MD       insulin aspart (novoLOG) injection 0-9 Units  0-9 Units Subcutaneous TID WC Mansy, Jan A, MD   2 Units at 09/07/22 1726   lipase/protease/amylase (CREON) capsule 72,000 Units  72,000 Units Oral TID WC Renda Rolls, RPH   72,000 Units at 09/08/22 1251   And   lipase/protease/amylase (CREON) capsule 36,000 Units  36,000 Units Oral PRN Renda Rolls, RPH       meclizine (ANTIVERT) tablet 25 mg  25 mg Oral Q6H PRN Mansy, Jan A, MD       metoCLOPramide (REGLAN) injection 10 mg  10 mg Intravenous Q6H Nicole Kindred A, DO   10 mg at 09/08/22 1250   mirtazapine (REMERON) tablet 7.5  mg  7.5 mg Oral QHS Mansy, Jan A, MD   7.5 mg at 09/07/22 2121   multivitamin (RENA-VIT) tablet 1 tablet  1 tablet Oral Daily Mansy, Jan A, MD   1 tablet at 09/08/22 1254   Oral care mouth rinse  15 mL Mouth Rinse PRN Nicole Kindred A, DO       pantoprazole (PROTONIX) injection 40 mg  40 mg Intravenous Q12H Mansy, Jan A, MD   40 mg at 09/08/22 1250   polyethylene glycol (MIRALAX / GLYCOLAX) packet 17 g  17 g Oral Daily PRN Mansy, Jan A, MD       promethazine (PHENERGAN) tablet 12.5 mg  12.5 mg Oral Q6H Griffith, Kelly A, DO   12.5 mg at 09/08/22 1302   traZODone (DESYREL) tablet 25 mg  25 mg Oral QHS PRN Mansy, Jan A, MD   25 mg at 09/06/22 2037     Discharge Medications: Please see discharge summary for a list of discharge medications.  Relevant Imaging Results:  Relevant Lab Results:   Additional Information SS# [id number]  Conception Oms, RN

## 2022-09-08 NOTE — Progress Notes (Addendum)
Progress Note   Patient: [name] LPF:[id number] DOB: 08/04/1961 DOA: 09/03/2022     3 DOS: the patient was seen and examined on 09/08/2022   Brief hospital course: [name] is a 61 y.o. female with medical history significant for end-stage renal disease on hemodialysis on MWF, asthma, osteoarthritis, chronic diastolic CHF, COPD, coronary artery disease, type 2 diabetes mellitus, gastroparesis, GERD, diverticulosis and peripheral neuropathy, who presented to the ER 09/03/2022 for evaluation of intractable nausea and vomiting since evening before.  No bilious vomitus or hematemesis.  She admits to epigastric abdominal pain and denies any diarrhea.   CT abdomen/pelvis in the ED was without acute findings. It showed anasarca and small bilateral pleural effusions.  Gastric wall thickening which could indicate gastritis (vs chronic).  Abdominopelvic adenopathy mildly improved from prior, suspected reactive.  Admitted to the hospital for further evaluation and management.  Started on supportive care with IV antiemetics, IV Protonix.  Clear liquid diet to allow GI tract to rest.    Nephrology consulted for dialysis.  Gastroenterology consulted for persistent N/V and several chronic GI issues with planned outpatient endoscopies scheduled.  12/17: pt seems improved on scheduled PO Phenergan today. Discussed with GI and will do challenge with Reglan to monitor for allergy vs intolerance given unclear allergy history.    Assessment and Plan: * Intractable nausea and vomiting Likely Acute gastritis that could be viral. Continue supportive care   IV antiemetics, IV PPI.   Heart healthy diet as tolerated. GI consulted - see their recommnedations. There was planned EGD/C-scope as outpatient 12/15. Pt is too high risk to tolerate sedation, therefore no procedures are planned. Continue scheduled PO Phenergan and monitor.  Gastroparesis due to DM [facility]) Documented allergy to Reglan. Not on motility  agent. PO Phenergan scheduled (as of 12/16 to monitor improvement in N/V, dry heaves). GI is following. Trial Reglan while here (documented allergy vs intolerance but remote & pt does not recall details)  12/17: N/V seems better with scheduled Phenergan. Start Reglan trial and monitor for side effects 12/18: Tolerating Reglan okay so far.  N/V seems improved.  Upper GI done consistent with gastroparesis with contrast not advancing to gastric fundus  Type 1 diabetes mellitus with hypoglycemia without coma, without long-term current use of insulin (HCC) Hypoglycemia due to N/V and lack of PO intake. --Hypoglycemic protocol --Continue D10W at 50 cc /hr --Hold off insulin for now  Essential hypertension Continue her antihypertensives. PRN IV hydralazine ordered.  End-stage renal disease on hemodialysis [facility]) Nephrology consulted for dialysis. Dialyzed 12/14.  Dyslipidemia Continue statin  Peripheral neuropathy Continue Neurontin.  Hyponatremia Na 131 >> 128 >> 125 this AM.   Suspect due to progressive volume status due to not tolerating dialysis on Saturday.   She's had poor PO intake as well, however. --Nephrology following --Repeat Na level after dialysis today --Monitor BMP --Volume mgmt by dialysis --Further evaluation if not improved with volume removal next dialysis  Hypomagnesemia Replacing IV Mg-sulfate for Mg 1.5 (12/17). Monitor Mg level, replace PRN  Exocrine pancreatic insufficiency Follows with GI. Continue Creon  Recurrent Clostridioides difficile infection Not likely active infection.  She is followed by GI for recurrent C diff and had repeat stool studies on 12/3 which were antigen positive, toxin negative, but PCR positive. Pt has intractable N/V but has denied recent diarrhea.  No treatment indicated at this time.          Subjective: Pt was awake resting in bed, seen after dialysis and  upper GI study this AM.  Her N/V seems improved, although she  is poor historian due to very hard of hearing.  No emesis in bag, only tissue.  She has no obvious side effects or allergic reaction to Reglan, started yesterday.  Also remains on scheduled PO phenergan.   Physical Exam: Vitals:   09/08/22 1000 09/08/22 1030 09/08/22 1035 09/08/22 1216  BP: (!) 123/49 121/60 (!) 129/49 (!) 105/41  Pulse: 71 71 67 65  Resp: '16 13 13 14  '$ Temp:   (!) 97.5 F (36.4 C) 97.6 F (36.4 C)  TempSrc:   Oral   SpO2: 100% 100% 100% 100%  Weight:      Height:       General exam: awake & alert, no acute distress, chronically ill-appearing HEENT: moist mucus membranes, very hard of hearing Respiratory system: lungs clear diminished bases, normal respiratory effort. Cardiovascular system: normal S1/S2, RRR.   Gastrointestinal system: abdomen soft non-distended, mild epigastric tenderness Central nervous system: no gross focal neurologic deficits, normal speech Extremities: severe BLE venous stasis and skin wrinkling consistent with prior edema Psychiatry: normal mood, flat affect   Data Reviewed:  Notable labs ---   Na 131 >> 128 >> 125, Cl 93, Cr 4.66, Ca 7.9, Hbg 8.5  Upper GI series today -- study was severely limited by patient's poor hearing and inability to hear and follow instructions.  Esophagus structurally unremarkable but showed moderate dysmotility. Contrast did not move to the gastric fundus after 10 minutes (consistent with gastroparesis).  Recent C diff antigen positive, PCR positive, toxin negative.   Pt not having diarrhea but has history of recurrent C diff.  GI recommended no treatment.    Family Communication: None  Disposition: Status is: Inpatient  Remains admitted due to ongoing GI evaluation.  N/V seems improving, monitoring response to medication changes.  Anticipate medically ready to d/c in 24-48 hours pending clearance by GI and clinical improvement.     Planned Discharge Destination: SNF recommended     Time spent: 35  minutes  Author: Ezekiel Slocumb, DO 09/08/2022 2:27 PM  For on call review [email or web address].

## 2022-09-08 NOTE — Progress Notes (Signed)
Tx completed, patient did not finish full time due to a procedure.

## 2022-09-09 DIAGNOSIS — Z7189 Other specified counseling: Secondary | ICD-10-CM

## 2022-09-09 DIAGNOSIS — R112 Nausea with vomiting, unspecified: Secondary | ICD-10-CM | POA: Diagnosis not present

## 2022-09-09 DIAGNOSIS — K529 Noninfective gastroenteritis and colitis, unspecified: Secondary | ICD-10-CM | POA: Diagnosis present

## 2022-09-09 LAB — BASIC METABOLIC PANEL
Anion gap: 5 (ref 5–15)
BUN: 7 mg/dL — ABNORMAL LOW (ref 8–23)
CO2: 27 mmol/L (ref 22–32)
Calcium: 7.7 mg/dL — ABNORMAL LOW (ref 8.9–10.3)
Chloride: 97 mmol/L — ABNORMAL LOW (ref 98–111)
Creatinine, Ser: 3.8 mg/dL — ABNORMAL HIGH (ref 0.44–1.00)
GFR, Estimated: 13 mL/min — ABNORMAL LOW (ref >60)
Glucose, Bld: 72 mg/dL (ref 70–99)
Potassium: 3.7 mmol/L (ref 3.5–5.1)
Sodium: 129 mmol/L — ABNORMAL LOW (ref 135–145)

## 2022-09-09 LAB — MAGNESIUM: Magnesium: 1.6 mg/dL — ABNORMAL LOW (ref 1.7–2.4)

## 2022-09-09 LAB — GLUCOSE, CAPILLARY
Glucose-Capillary: 73 mg/dL (ref 70–99)
Glucose-Capillary: 82 mg/dL (ref 70–99)
Glucose-Capillary: 89 mg/dL (ref 70–99)

## 2022-09-09 LAB — CBC
HCT: 24.2 % — ABNORMAL LOW (ref 36.0–46.0)
Hemoglobin: 7.8 g/dL — ABNORMAL LOW (ref 12.0–15.0)
MCH: 25.6 pg — ABNORMAL LOW (ref 26.0–34.0)
MCHC: 32.2 g/dL (ref 30.0–36.0)
MCV: 79.3 fL — ABNORMAL LOW (ref 80.0–100.0)
Platelets: 113 10*3/uL — ABNORMAL LOW (ref 150–400)
RBC: 3.05 MIL/uL — ABNORMAL LOW (ref 3.87–5.11)
RDW: 19.8 % — ABNORMAL HIGH (ref 11.5–15.5)
WBC: 4.2 10*3/uL (ref 4.0–10.5)
nRBC: 0 % (ref 0.0–0.2)

## 2022-09-09 LAB — PHOSPHORUS: Phosphorus: 1.9 mg/dL — ABNORMAL LOW (ref 2.5–4.6)

## 2022-09-09 MED ORDER — ANTICOAGULANT SODIUM CITRATE 4% (200MG/5ML) IV SOLN: 5.0000 mL | Status: DC | PRN

## 2022-09-09 MED ORDER — HEPARIN SODIUM (PORCINE) 1000 UNIT/ML IJ SOLN
INTRAMUSCULAR | Status: AC
  Filled 2022-09-09: qty 4

## 2022-09-09 MED ORDER — HEPARIN SODIUM (PORCINE) 1000 UNIT/ML DIALYSIS: 1000.0000 [IU] | INTRAMUSCULAR | Status: DC | PRN

## 2022-09-09 MED ORDER — LIDOCAINE HCL (PF) 1 % IJ SOLN: 5.0000 mL | INTRAMUSCULAR | Status: DC | PRN

## 2022-09-09 MED ORDER — POLYVINYL ALCOHOL 1.4 % OP SOLN
1.0000 [drp] | OPHTHALMIC | Status: DC | PRN
  Filled 2022-09-09: qty 15

## 2022-09-09 MED ORDER — LIDOCAINE-PRILOCAINE 2.5-2.5 % EX CREA: 1.0000 | TOPICAL_CREAM | CUTANEOUS | Status: DC | PRN

## 2022-09-09 MED ORDER — MAGNESIUM SULFATE 4 GM/100ML IV SOLN
4.0000 g | Freq: Once | INTRAVENOUS | Status: AC
  Administered 2022-09-09: 4 g via INTRAVENOUS
  Filled 2022-09-09: qty 100

## 2022-09-09 MED ORDER — PENTAFLUOROPROP-TETRAFLUOROETH EX AERO: 1.0000 | INHALATION_SPRAY | CUTANEOUS | Status: DC | PRN

## 2022-09-09 MED ORDER — ALTEPLASE 2 MG IJ SOLR: 2.0000 mg | Freq: Once | INTRAMUSCULAR | Status: DC | PRN

## 2022-09-09 MED ORDER — K PHOS MONO-SOD PHOS DI & MONO 155-852-130 MG PO TABS
250.0000 mg | ORAL_TABLET | Freq: Three times a day (TID) | ORAL | Status: AC
  Administered 2022-09-09 – 2022-09-10 (×3): 250 mg via ORAL
  Filled 2022-09-09 (×3): qty 1

## 2022-09-09 NOTE — Progress Notes (Addendum)
Progress Note   Patient: [name] ZOX:[id number] DOB: Mar 17, 1961 DOA: 09/03/2022     4 DOS: the patient was seen and examined on 09/09/2022   Brief hospital course: BRANDYCE DIMARIO is a 61 y.o. female with medical history significant for end-stage renal disease on hemodialysis on MWF, asthma, osteoarthritis, chronic diastolic CHF, COPD, coronary artery disease, type 2 diabetes mellitus, gastroparesis, GERD, diverticulosis and peripheral neuropathy, who presented to the ER 09/03/2022 for evaluation of intractable nausea and vomiting since evening before.  No bilious vomitus or hematemesis.  She admits to epigastric abdominal pain and denies any diarrhea.   CT abdomen/pelvis in the ED was without acute findings. It showed anasarca and small bilateral pleural effusions.  Gastric wall thickening which could indicate gastritis (vs chronic).  Abdominopelvic adenopathy mildly improved from prior, suspected reactive.  Admitted to the hospital for further evaluation and management.  Started on supportive care with IV antiemetics, IV Protonix.  Clear liquid diet to allow GI tract to rest.    Nephrology consulted for dialysis.  Gastroenterology consulted for persistent N/V and several chronic GI issues with planned outpatient endoscopies scheduled.  12/17: pt seems improved on scheduled PO Phenergan today. Discussed with GI and will do challenge with Reglan to monitor for allergy vs intolerance given unclear allergy history.    12/18: so far no apparent adverse effects of Reglan.  N/V seems improved, still intermittent  Assessment and Plan: * Intractable nausea and vomiting Uncontrolled gastroparesis.  Taken off Reglan outpatient for unclear reasons.  --Continue supportive care  -- IV antiemetics, IV PPI.   --Heart healthy diet as tolerated. --GI consulted - see their recommnedations. There was planned EGD/C-scope as outpatient 12/15. Pt is too high risk to tolerate sedation, therefore no procedures  are planned. --Continue scheduled PO Phenergan, IV Reglan. --Transition to PO Reglan tomorrow if tolerating without adverse effects & continue at discharge --Close GI follow up  Gastroparesis due to DM [facility]) Documented allergy to Reglan. Not on motility agent. PO Phenergan scheduled (as of 12/16 to monitor improvement in N/V, dry heaves). GI is following. Trial Reglan while here (documented allergy vs intolerance but remote & pt does not recall details)  12/17: N/V seems better with scheduled Phenergan. Start Reglan trial and monitor for side effects 12/18: Tolerating Reglan okay so far.  N/V seems improved.  Upper GI done consistent with gastroparesis with contrast not advancing to gastric fundus 12/19: tolerating Reglan, N/V much improved  Type 1 diabetes mellitus with hypoglycemia without coma, without long-term current use of insulin (HCC) Hypoglycemia due to N/V and lack of PO intake. 12/19: sugars only 70-90's on D10 drip --Hypoglycemic protocol --Continue D10W at 30 cc /hr --Hold off insulin for now --Wean off D10W as sugars tolerate --Encourage PO intake  --D/c'd sliding scale orders  Hyponatremia Na 125 yesterday, improved to 131 after dialysis yesterday, this AM 129.   Suspect due to progressive volume status due to not tolerating dialysis on Saturday.   She's had poor PO intake as well, however. --Nephrology following --Monitor BMP --Volume mgmt by dialysis --Further evaluation if not improved with volume removal next dialysis  Hypomagnesemia Replacing IV Mg-sulfate for Mg 1.5 (12/17). Monitor Mg level, replace PRN  Essential hypertension Continue her antihypertensives. PRN IV hydralazine ordered.  End-stage renal disease on hemodialysis [facility]) Nephrology consulted for dialysis. Dialyzed 12/14.  Dyslipidemia Continue statin  Peripheral neuropathy Continue Neurontin.  Chronic diarrhea Pt has hx of recurrent C diff. Follows with GI for multiple chronic GI  issues.   Last C diff screen positive, but pt was not having diarrhea or signs of infection, GI recommended no treatment.  Son reports sudden onset diarrhea right after PO intake at home.   ?IBS - consider Bentyl trial. GI consulted. --Close GI follow up needed  Exocrine pancreatic insufficiency Follows with GI. Continue Creon  Recurrent Clostridioides difficile infection Not likely active infection.  She is followed by GI for recurrent C diff and had repeat stool studies on 12/3 which were antigen positive, toxin negative, but PCR positive. Pt has intractable N/V but has denied recent diarrhea.  No treatment indicated at this time.          Subjective: Pt was awake resting in bed, seen after dialysis and upper GI study this AM.  Her N/V seems improved, although she is poor historian due to very hard of hearing.  No emesis in bag, only tissue.  She has no obvious side effects or allergic reaction to Reglan, started yesterday.  Also remains on scheduled PO phenergan.   Physical Exam: Vitals:   09/09/22 1100 09/09/22 1123 09/09/22 1130 09/09/22 1147  BP: (!) 155/68 (!) 150/74 (!) 148/71 (!) 141/68  Pulse: 65 63 66 63  Resp: '13 14 15 16  '$ Temp:  98.1 F (36.7 C)    TempSrc:  Oral    SpO2: 100% 100% 100%   Weight:  80.2 kg    Height:       General exam: awake & alert, no acute distress, chronically ill-appearing HEENT: moist mucus membranes, very hard of hearing Respiratory system: lungs clear diminished bases, normal respiratory effort. Cardiovascular system: normal S1/S2, RRR.   Gastrointestinal system: abdomen soft non-distended, mild epigastric tenderness Central nervous system: no gross focal neurologic deficits, normal speech Extremities: Left > right UE edema, severe BLE venous stasis and skin wrinkling consistent with prior edema. Psychiatry: normal mood, flat affect   Data Reviewed:  Notable labs ---   Na 131 >> 128 >> 125 (pre-dialysis 12/18) >> 131 (after HD) >>  129 this AM Cl 97, Cr 3.80, Ca 7.7, Hbg 8.5 >> 7.8, platelets 152 >> 113k   Upper GI series 12/18 -- study was severely limited by patient's poor hearing and inability to hear and follow instructions.  Esophagus structurally unremarkable but showed moderate dysmotility. Contrast did not move to the gastric fundus after 10 minutes (consistent with gastroparesis).  Note - Recent C diff antigen positive, PCR positive, toxin negative.   Pt not having diarrhea but has history of recurrent C diff.   GI recommended no treatment.   Family Communication: Son updated by phone this afternoon 12/19.  Disposition: Status is: Inpatient  Remains admitted due to - persistent electrolyte derangements. N/V improved with Reglan and Phenergan, monitoring response to medication changes.   Anticipate medically ready to d/c in 24-48 hours pending improvement.     Planned Discharge Destination: SNF recommended     Time spent: 35 minutes  Author: Ezekiel Slocumb, DO 09/09/2022 3:21 PM  For on call review [email or web address].

## 2022-09-09 NOTE — Progress Notes (Signed)
[name] MRN: [id number] DOB: Feb 11, 1961   Cancelled Treatment:     Pt at dialysis this am, attempted again this afternoon. Pt had just received pain meds for arm pain, also c/o nausea and inability to tolerate any activity. Will plan to re-attempt next available date/time.   Josie Dixon 09/09/2022, 3:08 PM

## 2022-09-09 NOTE — Assessment & Plan Note (Addendum)
Pt has hx of recurrent C diff. Follows with GI for multiple chronic GI issues.   Last C diff screen positive, but pt was not having diarrhea or signs of infection, GI recommended no treatment.  Son reports sudden onset diarrhea right after PO intake at home.   This could possibly be from IBS.  Attempted trial of rectal tube, but patient could not tolerate.  Having significant pain on her backside, so we will add over overlay mattress plus morphine drip.

## 2022-09-09 NOTE — TOC Progression Note (Signed)
Transition of Care [facility]) - Progression Note    Patient Details  Name: [name] MRN: [id number] Date of Birth: 1961/05/28  Transition of [facility] CM/SW Contact  Conception Oms, RN Phone Number: 09/09/2022, 9:20 AM  Clinical Narrative:    NO bed offers at this time, resent and expanded the bed search, will review the bed offers once obtained        Expected Discharge Plan and Services                                                 Social Determinants of Health (SDOH) Interventions    Readmission Risk Interventions    05/03/2021    3:02 PM 12/11/2020    9:23 AM 09/21/2020    9:39 AM  Readmission Risk Prevention Plan  Transportation Screening Complete Complete Complete  Medication Review ([place]) Complete Complete Complete  PCP or Specialist appointment within 3-5 days of discharge  Complete Complete  HRI or Home Care Consult  Complete Complete  SW Recovery Care/Counseling Consult  Complete Complete  Palliative Care Screening Complete Not Applicable Complete  [place]  Not Complete Complete  SNF Comments  working on placement

## 2022-09-09 NOTE — Plan of Care (Signed)

## 2022-09-09 NOTE — Progress Notes (Signed)
[facility]  ROUNDING NOTE   Subjective:   [name] is a 61 y.o., female with past medical history of anemia, asthma, diabetes, CAD, PAD, GERD, and end stage renal disease on dialysis.  Patient arrived to emergency department via EMS with complaints of abdominal pain.  Patient has been admitted for Generalized abdominal pain [R10.84] Intractable nausea and vomiting [R11.2] Gastritis without bleeding, unspecified chronicity, unspecified gastritis type [K29.70]  Patient is known to our practice and receives outpatient dialysis treatments at [facility] on a TTS schedule, supervised by Dr. Holley Raring.    Patient seen and evaluated during dialysis   HEMODIALYSIS FLOWSHEET:  Blood Flow Rate (mL/min): 400 mL/min Arterial Pressure (mmHg): -150 mmHg Venous Pressure (mmHg): 150 mmHg TMP (mmHg): -1 mmHg Ultrafiltration Rate (mL/min): 434 mL/min Dialysate Flow Rate (mL/min): 300 ml/min Dialysis Fluid Bolus: Normal Saline Bolus Amount (mL): 0 mL  No complaints to offer at this time Tolerating treatment well  Objective:  Vital signs in last 24 hours:  Temp:  [97.8 F (36.6 C)-98.3 F (36.8 C)] 98.1 F (36.7 C) (12/19 1123) Pulse Rate:  [63-70] 63 (12/19 1147) Resp:  [12-18] 16 (12/19 1147) BP: (110-155)/(44-74) 141/68 (12/19 1147) SpO2:  [100 %] 100 % (12/19 1130) Weight:  [80.2 kg-81.2 kg] 80.2 kg (12/19 1123)  Weight change:  Filed Weights   09/08/22 0834 09/09/22 0751 09/09/22 1123  Weight: 81.5 kg 81.2 kg 80.2 kg    Intake/Output: I/O last 3 completed shifts: In: 120 [P.O.:120] Out: 0.4 [Other:0.4]   Intake/Output this shift:  Total I/O In: -  Out: 1000 [Other:1000]  Physical Exam: General: NAD  Head: Normocephalic, atraumatic.  Dry oral mucosal membranes  Eyes: Anicteric  Lungs:  Clear to auscultation. Normal effort, [place] O2  Heart: Regular rate and rhythm  Abdomen:  Soft, nontender  Extremities:  No peripheral edema.  Neurologic: Nonfocal,  moving all four extremities  Skin: No lesions, dry  Access: Rt permcath    Basic Metabolic Panel: Recent Labs  Lab 09/03/22 1313 09/04/22 0232 09/06/22 0947 09/07/22 0431 09/08/22 0549 09/08/22 1518 09/09/22 0306  NA 136 133* 131* 128* 125* 131* 129*  K 3.1* 4.3 3.5 3.8 3.6  --  3.7  CL 97* 99 94* 94* 93*  --  97*  CO2 32 '27 30 28 26  '$ --  27  GLUCOSE 84 190* 84 154* 76  --  72  BUN 7* 7* 5* 7* 9  --  7*  CREATININE 4.02* 4.25* 3.75* 4.18* 4.66*  --  3.80*  CALCIUM 7.8* 7.4* 7.9* 7.5* 7.9*  --  7.7*  MG 1.9  --   --  1.5* 1.8  --  1.6*  PHOS  --   --   --  2.4*  --   --  1.9*     Liver Function Tests: Recent Labs  Lab 09/03/22 1313  AST 25  ALT 9  ALKPHOS 76  BILITOT 0.6  PROT 6.5  ALBUMIN 2.7*     Recent Labs  Lab 09/03/22 1313  LIPASE 26     No results for input(s): "AMMONIA" in the last 168 hours.  CBC: Recent Labs  Lab 09/03/22 1313 09/04/22 0408 09/06/22 0947 09/08/22 0549 09/09/22 0809  WBC 4.1 4.6 4.9 5.0 4.2  HGB 8.9* 9.2* 8.7* 8.5* 7.8*  HCT 28.0* 27.7* 27.3* 25.6* 24.2*  MCV 81.4 78.9* 80.8 77.8* 79.3*  PLT 110* 115* 118* 152 113*     Cardiac Enzymes: No results for input(s): "CKTOTAL", "CKMB", "  CKMBINDEX", "TROPONINI" in the last 168 hours.   BNP: Invalid input(s): "POCBNP"  CBG: Recent Labs  Lab 09/07/22 2030 09/08/22 1217 09/08/22 1647 09/08/22 2113 09/09/22 1329  GLUCAP 121* 93 95 91 73      Microbiology: Results for orders placed or performed during the hospital encounter of 08/24/22  Gastrointestinal Panel by PCR , Stool     Status: None   Collection Time: 08/24/22  4:20 PM   Specimen: Stool  Result Value Ref Range Status   Campylobacter species NOT DETECTED NOT DETECTED Final   Plesimonas shigelloides NOT DETECTED NOT DETECTED Final   Salmonella species NOT DETECTED NOT DETECTED Final   Yersinia enterocolitica NOT DETECTED NOT DETECTED Final   Vibrio species NOT DETECTED NOT DETECTED Final   Vibrio  cholerae NOT DETECTED NOT DETECTED Final   Enteroaggregative E coli (EAEC) NOT DETECTED NOT DETECTED Final   Enteropathogenic E coli (EPEC) NOT DETECTED NOT DETECTED Final   Enterotoxigenic E coli (ETEC) NOT DETECTED NOT DETECTED Final   Shiga like toxin producing E coli (STEC) NOT DETECTED NOT DETECTED Final   Shigella/Enteroinvasive E coli (EIEC) NOT DETECTED NOT DETECTED Final   Cryptosporidium NOT DETECTED NOT DETECTED Final   Cyclospora cayetanensis NOT DETECTED NOT DETECTED Final   Entamoeba histolytica NOT DETECTED NOT DETECTED Final   Giardia lamblia NOT DETECTED NOT DETECTED Final   Adenovirus F40/41 NOT DETECTED NOT DETECTED Final   Astrovirus NOT DETECTED NOT DETECTED Final   Norovirus GI/GII NOT DETECTED NOT DETECTED Final   Rotavirus A NOT DETECTED NOT DETECTED Final   Sapovirus (I, II, IV, and V) NOT DETECTED NOT DETECTED Final    Comment: Performed at [facility], [address] [place], [place] [address]  C Difficile Quick Screen w PCR reflex     Status: Abnormal   Collection Time: 08/24/22  4:20 PM   Specimen: STOOL  Result Value Ref Range Status   C Diff antigen POSITIVE (A) NEGATIVE Final   C Diff toxin NEGATIVE NEGATIVE Final   C Diff interpretation Results are indeterminate. See PCR results.  Final    Comment: Performed at [facility], [address] [place], [place] [address]  C. Diff by PCR, Reflexed     Status: Abnormal   Collection Time: 08/24/22  4:20 PM  Result Value Ref Range Status   Toxigenic C. Difficile by PCR POSITIVE (A) NEGATIVE Final    Comment: Positive for toxigenic C. difficile with little to no toxin production. Only treat if clinical presentation suggests symptomatic illness. Performed at [facility], [address] [place], [place] [address]     Coagulation Studies: No results for input(s): "LABPROT", "INR" in the last 72 hours.   Urinalysis: No results for input(s): "COLORURINE", "LABSPEC", "PHURINE",  "GLUCOSEU", "HGBUR", "BILIRUBINUR", "KETONESUR", "PROTEINUR", "UROBILINOGEN", "NITRITE", "LEUKOCYTESUR" in the last 72 hours.  Invalid input(s): "APPERANCEUR"    Imaging: DG UGI W SINGLE CM (SOL OR THIN BA)  Result Date: 09/08/2022 CLINICAL DATA:  Patient presents for single-contrast upper GI series due to intractable vomiting. EXAM: UPPER GI SERIES WITHOUT KUB TECHNIQUE: Routine upper GI series was performed with thin density barium. FLUOROSCOPY: Radiation Exposure Index (as provided by the fluoroscopic device): 64.5 mGy Kerma COMPARISON:  CT 08/24/2022 FINDINGS: Patient presents for a limited single contrast upper GI series. Note that this is a severely limited exam due to patient's difficulty following instructions as well as severe limited mobility. Exam could only be performed with patient in the semi erect supine  position only. Thin barium was given orally. Esophagus is normal in course, contour and caliber without evidence of focal mass or stricture. No hiatal hernia. No aspiration. There is dysmotility with mild-to-moderate stasis of the barium column within the esophagus. Gastroesophageal junction is patent. Multiple tertiary contractions are evident. Contrast will not move distal to the gastric fundus. Attempt was made to roll patient on her right side unsuccessfully. After 10 minutes, contrast had not moved the on the gastric fundus into the mid to distal stomach. There is no evidence gastric peristalsis during real-time fluoroscopic observation which may be due significant gastroparesis. Procedure was terminated at this point. IMPRESSION: 1. Severely limited single contrast upper GI series as described. Moderate esophageal dysmotility as described. 2. No evidence of gastric peristalsis during real-time fluoroscopic observation which may be due to significant gastroparesis as contrast would not move distal to the gastric fundus. Electronically Signed   By: Marin Olp M.D.   On: 09/08/2022  11:56     Medications:    dextrose 30 mL/hr at 09/08/22 1651   magnesium sulfate bolus IVPB 4 g (09/09/22 1249)     acidophilus  1 capsule Oral Daily   amLODipine  10 mg Oral Daily   Chlorhexidine Gluconate Cloth  6 each Topical Daily   cholecalciferol  1,000 Units Oral Daily   cyanocobalamin  1,000 mcg Oral Daily   feeding supplement (NEPRO CARB STEADY)  237 mL Oral BID BM   fluticasone  2 spray Each Nare Daily   heparin injection (subcutaneous)  5,000 Units Subcutaneous Q8H   heparin sodium (porcine)       hydrALAZINE  50 mg Oral BID   HYDROcodone-acetaminophen  1 tablet Oral Q8H   insulin aspart  0-5 Units Subcutaneous QHS   insulin aspart  0-9 Units Subcutaneous TID WC   lipase/protease/amylase  72,000 Units Oral TID WC   metoCLOPramide (REGLAN) injection  10 mg Intravenous Q6H   mirtazapine  7.5 mg Oral QHS   multivitamin  1 tablet Oral Daily   pantoprazole (PROTONIX) IV  40 mg Intravenous Q12H   promethazine  12.5 mg Oral Q6H   acetaminophen **OR** acetaminophen, albuterol, alum & mag hydroxide-simeth, heparin sodium (porcine), hydrALAZINE, hydrOXYzine, lipase/protease/amylase **AND** lipase/protease/amylase, meclizine, mouth rinse, polyethylene glycol, traZODone  Assessment/ Plan:  [name] is a 61 y.o.  female with past medical history of anemia, asthma, diabetes, CAD, PAD, GERD, and end stage renal disease on dialysis.  Patient arrived to emergency department via EMS after being found at home by grandson with multiple space heaters deterrent to high setting and patient wrapped in heated blanket.  Patient has been admitted for Generalized abdominal pain [R10.84] Intractable nausea and vomiting [R11.2] Gastritis without bleeding, unspecified chronicity, unspecified gastritis type [K29.70]  [facility]/TTS/Rt Permcath  End-stage renal disease on hemodialysis.    Patient received partial dialysis treatment yesterday due to GI procedure.  Patient will receive  full dialysis treatment today to maintain outpatient schedule.  UF goal 1 L as tolerated.  Next treatment scheduled for Thursday.  2. Anemia of chronic kidney disease Lab Results  Component Value Date   HGB 7.8 (L) 09/09/2022    Patient receives [place] outpatient.  Hemoglobin remains decreased.  Will order EPO with dialysis treatments.  3. Secondary Hyperparathyroidism: with outpatient labs: PTH 485, phosphorus 3.9, calcium 8.5 on 06/03/22.   Lab Results  Component Value Date   PTH 357 (H) 10/05/2018   CALCIUM 7.7 (L) 09/09/2022   CAION 1.09 (L)  04/13/2020   PHOS 1.9 (L) 09/09/2022   Monitoring bone minerals during this admission.  Phosphorus decreased to due to poor oral intake.  No need for binders at this time.  4. Diabetes mellitus type II with chronic kidney disease/renal manifestations:noninsulin dependent.   Glucose stable. Primary team to manage sliding scale insulin.  5.  Hypertension with chronic kidney disease.     Currently prescribed amlodipine, furosemide, and hydralazine.  Blood pressure 146/58 during idalysis     LOS: 4   12/19/20231:52 PM

## 2022-09-09 NOTE — Progress Notes (Signed)
Patient tolerated 3.15 hours of HD. Patient was sleeping most of the time. No episode of nausea/vomiting.  UF: 1L    09/09/22 1123  Vitals  Temp 98.1 F (36.7 C)  Temp Source Oral  BP (!) 150/74  MAP (mmHg) 93  BP Location Left Arm  BP Method Automatic  Patient Position (if appropriate) Lying  Pulse Rate 63  Pulse Rate Source Monitor  ECG Heart Rate 63  Resp 14  Oxygen Therapy  SpO2 100 %  O2 Device Nasal Cannula  O2 Flow Rate (L/min) 3 L/min  Patient Activity (if Appropriate) In bed  Pulse Oximetry Type Continuous  During Treatment Monitoring  HD Safety Checks Performed Yes  Intra-Hemodialysis Comments Tx completed;Tolerated well  Post Treatment  Dialyzer Clearance Lightly streaked  Duration of HD Treatment -hour(s) 3.25 hour(s)  Hemodialysis Intake (mL) 0 mL  Liters Processed 78  Fluid Removed (mL) 1000 mL  Tolerated HD Treatment Yes  Post-Hemodialysis Comments Tx complete. Tolerated well. No complications. Goal met.  Note  Observations Tx complete  Hemodialysis Catheter Right Internal jugular Double lumen Permanent (Tunneled)  Placement Date/Time: 06/17/22 1653   Serial / Lot #: [id number]  Expiration Date: 12/25/26  Time Out: Correct patient;Correct site;Correct procedure  Maximum sterile barrier precautions: Hand hygiene;Cap;Mask;Sterile gown;Sterile gloves;Large sterile ...  Site Condition No complications  Blue Lumen Status Dead end cap in place;Blood return noted;Heparin locked  Red Lumen Status Dead end cap in place;Blood return noted;Heparin locked  Purple Lumen Status N/A  Catheter fill solution Heparin 1000 units/ml  Catheter fill volume (Arterial) 1.6 cc  Catheter fill volume (Venous) 1.6  Dressing Type Transparent  Dressing Status Antimicrobial disc in place;Clean, Dry, Intact  Drainage Description None  Dressing Change Due 09/13/22  Post treatment catheter status Capped and Clamped

## 2022-09-09 NOTE — Consult Note (Signed)
Consultation Note Date: 09/09/2022   Patient Name: [name]  DOB: 06-19-1961  MRN: [id number]  Age / Sex: 61 y.o., female  PCP: Denton Lank, MD Referring Physician: Ezekiel Slocumb, DO  Reason for Consultation: Establishing goals of care  HPI/Patient Profile: [name] is a 61 y.o.  female with past medical history of anemia, asthma, diabetes, CAD, PAD, GERD, and end stage renal disease on dialysis.  Patient arrived to emergency department via EMS after being found at home by grandson with multiple space heaters deterrent to high setting and patient wrapped in heated blanket.  Patient has been admitted for Generalized abdominal pain   Clinical Assessment and Goals of Care: Notes and labs reviewed. Patient has been evaluated in the past by PMT, most recently by myself in 04/2021. Last discussion she was considering shifting to comfort care, but decided to continue cull code/ full scope.   Today she is extremely hard of hearing and cannot hear me. Stepped out to call son who was a work. It was extremely hard to hear him over the sound of the metal and machines behind him.   He states his mother's hearing has declined and she has hearing aids, but they do not seem to be helping. He states to speak with her you must call her on the phone.  Discussed following up with him tomorrow after I speak with her.   SUMMARY OF RECOMMENDATIONS   Per son, patient is able to hear if you call her. Discussed using a phone to call her room phone while at bedside to talk to her. Will follow up with son tomorrow.       Primary Diagnoses: Present on Admission:  Intractable nausea and vomiting  Essential hypertension  Dyslipidemia  Type 1 diabetes mellitus with hypoglycemia without coma, without long-term current use of insulin (HCC)  Recurrent Clostridioides difficile infection  Exocrine pancreatic insufficiency   Gastroparesis due to DM (HCC)  Hyponatremia  Chronic diarrhea   I have reviewed the medical record, interviewed the patient and family, and examined the patient. The following aspects are pertinent.  Past Medical History:  Diagnosis Date   Anemia    Anginal pain ([place])    Anxiety    Arthritis    Asthma    Broken wrist    Bronchitis    chronic diastolic CHF 9/38/1829   Chronic kidney disease    COPD (chronic obstructive pulmonary disease) ([place])    Coronary artery disease    a. cath 2013: stenting to RCA (report not available); b. cath 2014: LM nl, pLAD 40%, mLAD nl, ost LCx 40%, mid LCx nl, pRCA 30% @ site of prior stent, mRCA 50%   Depression    Diabetes mellitus ([place])    Diabetes mellitus without complication ([place])    Diabetic neuropathy ([place])    dialysis 2006   Diverticulosis    Dizziness    Dyspnea    Elevated lipids    Environmental and seasonal allergies    ESRD (end stage  renal disease) on dialysis [facility])    M-W-F   Gastroparesis    GERD (gastroesophageal reflux disease)    Headache    History of anemia due to chronic kidney disease    History of hiatal hernia    HOH (hard of hearing)    Hx of pancreatitis 2015   Hypertension    Intractable nausea and vomiting 09/03/2022   Lower extremity edema    Mitral regurgitation    a. echo 10/2013: EF 62%, noWMA, mildly dilated LA, mild to mod MR/TR, GR1DD   Myocardial infarction ([place])    Orthopnea    Parathyroid abnormality ([place])    Peripheral arterial disease ([place])    Pneumonia    Renal cancer ([place])    Renal insufficiency    Pt is on dialysis on M,W + F.   Wheezing    Social History   Socioeconomic History   Marital status: Divorced    Spouse name: Not on file   Number of children: Not on file   Years of education: Not on file   Highest education level: Not on file  Occupational History   Not on file  Tobacco Use   Smoking status: Never   Smokeless tobacco: Never  Vaping Use   Vaping Use: Never used   Substance and Sexual Activity   Alcohol use: Not Currently    Comment: glass wine week per pt   Drug use: Yes    Types: Marijuana    Comment: once a day   Sexual activity: Not Currently  Other Topics Concern   Not on file  Social History Narrative   ** Merged History Encounter **       Social Determinants of Health   Financial Resource Strain: Low Risk  (02/12/2018)   Overall Financial Resource Strain (CARDIA)    Difficulty of Paying Living Expenses: Not hard at all  Food Insecurity: No Food Insecurity (09/04/2022)   Hunger Vital Sign    Worried About Running Out of Food in the Last Year: Never true    Ran Out of Food in the Last Year: Never true  Recent Concern: Food Insecurity - Food Insecurity Present (07/03/2022)   Hunger Vital Sign    Worried About Running Out of Food in the Last Year: Sometimes true    Ran Out of Food in the Last Year: Sometimes true  Transportation Needs: No Transportation Needs (09/04/2022)   PRAPARE - Hydrologist (Medical): No    Lack of Transportation (Non-Medical): No  Physical Activity: Insufficiently Active (02/12/2018)   Exercise Vital Sign    Days of Exercise per Week: 7 days    Minutes of Exercise per Session: 10 min  Stress: Stress Concern Present (02/12/2018)   [place]    Feeling of Stress : Very much  Social Connections: Moderately Integrated (02/12/2018)   Social Connection and Isolation Panel [NHANES]    Frequency of Communication with Friends and Family: More than three times a week    Frequency of Social Gatherings with Friends and Family: More than three times a week    Attends Religious Services: More than 4 times per year    Active Member of [employer] or Organizations: Yes    Attends Archivist Meetings: Never    Marital Status: Divorced   Family History  Problem Relation Age of Onset   Kidney disease Mother    Diabetes  Mother    Cancer Father  Kidney disease Sister    Scheduled Meds:  acidophilus  1 capsule Oral Daily   amLODipine  10 mg Oral Daily   Chlorhexidine Gluconate Cloth  6 each Topical Daily   cholecalciferol  1,000 Units Oral Daily   cyanocobalamin  1,000 mcg Oral Daily   feeding supplement (NEPRO CARB STEADY)  237 mL Oral BID BM   fluticasone  2 spray Each Nare Daily   heparin injection (subcutaneous)  5,000 Units Subcutaneous Q8H   heparin sodium (porcine)       hydrALAZINE  50 mg Oral BID   HYDROcodone-acetaminophen  1 tablet Oral Q8H   lipase/protease/amylase  72,000 Units Oral TID WC   metoCLOPramide (REGLAN) injection  10 mg Intravenous Q6H   mirtazapine  7.5 mg Oral QHS   multivitamin  1 tablet Oral Daily   pantoprazole (PROTONIX) IV  40 mg Intravenous Q12H   phosphorus  250 mg Oral TID   promethazine  12.5 mg Oral Q6H   Continuous Infusions:  dextrose 30 mL/hr at 09/08/22 1651   PRN Meds:.acetaminophen **OR** acetaminophen, albuterol, alum & mag hydroxide-simeth, heparin sodium (porcine), hydrALAZINE, hydrOXYzine, lipase/protease/amylase **AND** lipase/protease/amylase, mouth rinse, polyethylene glycol, traZODone Medications Prior to Admission:  Prior to Admission medications   Medication Sig Start Date End Date Taking? Authorizing Provider  amLODipine (NORVASC) 10 MG tablet Take 1 tablet (10 mg total) by mouth daily. 06/25/20  Yes Enzo Bi, MD  aspirin EC 81 MG tablet Take 81 mg by mouth daily.   Yes [provider]  atorvastatin (LIPITOR) 20 MG tablet Take 20 mg by mouth daily. 08/20/22  Yes [provider]  Cholecalciferol (VITAMIN D3) 25 MCG (1000 UT) CAPS Take 1,000 Units by mouth daily.    Yes [provider]  colestipol (COLESTID) 1 g tablet Take 2 g by mouth 2 (two) times daily. 08/11/22 08/11/23 Yes [provider]  CREON 36000-114000 units CPEP capsule Take 36,000 Units by mouth. Take 2 capsules with meals and 1 capsule with  snacks 03/12/22  Yes [provider]  cyanocobalamin 1000 MCG tablet Take 1,000 mcg by mouth daily.    Yes [provider]  epoetin alfa (EPOGEN) 10000 UNIT/ML injection Inject 1 mL (10,000 Units total) into the vein every Monday, Wednesday, and Friday with hemodialysis. 06/27/20  Yes Enzo Bi, MD  Ferrous Sulfate (IRON) 325 (65 Fe) MG TABS Take 325 mg by mouth daily.    Yes [provider]  fluticasone (FLONASE) 50 MCG/ACT nasal spray Place 2 sprays into both nostrils daily. 10/20/21  Yes [provider]  furosemide (LASIX) 80 MG tablet Take 80 mg by mouth daily.   Yes [provider]  gabapentin (NEURONTIN) 100 MG capsule Take 100 mg by mouth at bedtime.   Yes [provider]  hydrALAZINE (APRESOLINE) 100 MG tablet Take 0.5 tablets (50 mg total) by mouth 2 (two) times daily. Patient taking differently: Take 100 mg by mouth 3 (three) times daily. 12/06/21  Yes Bonnielee Haff, MD  HYDROcodone-acetaminophen (NORCO/VICODIN) 5-325 MG tablet Take 1 tablet by mouth as directed. 04/18/22  Yes [provider]  meclizine (ANTIVERT) 25 MG tablet Take 25 mg by mouth every 6 (six) hours as needed for dizziness.   Yes [provider]  mirtazapine (REMERON) 7.5 MG tablet Take 7.5 mg by mouth daily. 04/15/21  Yes [provider]  multivitamin (RENA-VIT) TABS tablet Take 1 tablet by mouth daily.    Yes [provider]  pantoprazole (PROTONIX) 40 MG tablet Take 1  tablet (40 mg total) by mouth 2 (two) times daily. 11/17/21 09/06/22 Yes Sharen Hones, MD  Probiotic Product (PROBIOTIC PO) Take 1 capsule by mouth daily.   Yes [provider]  sevelamer carbonate (RENVELA) 800 MG tablet Take 800 mg by mouth 4 (four) times daily. (Take with meals and snack)   Yes [provider]  acetaminophen (TYLENOL) 325 MG tablet Take 2 tablets (650 mg total) by mouth every 6 (six) hours as needed for mild pain, headache, moderate  pain or fever (fever >/= 101). 12/06/21   Bonnielee Haff, MD  albuterol (VENTOLIN HFA) 108 (90 Base) MCG/ACT inhaler Inhale 2 puffs into the lungs every 4 (four) hours as needed for wheezing or shortness of breath. 11/09/19   [provider]  alum & mag hydroxide-simeth (MAALOX/MYLANTA) 200-200-20 MG/5ML suspension Take 15 mLs by mouth every 6 (six) hours as needed for indigestion or heartburn. 06/25/20   Enzo Bi, MD  ammonium lactate (LAC-HYDRIN) 12 % lotion Apply 1 application topically 2 (two) times daily as needed for dry skin.     [provider]  calcium carbonate (TUMS - DOSED IN MG ELEMENTAL CALCIUM) 500 MG chewable tablet Chew 2.5 tablets (500 mg of elemental calcium total) by mouth every 6 (six) hours as needed for indigestion. 06/25/20   Enzo Bi, MD  hydrOXYzine (ATARAX) 25 MG tablet Take 25 mg by mouth 3 (three) times daily as needed.    [provider]  lidocaine (LIDODERM) 5 % Place 1 patch onto the skin daily. (Remove after 12 hours -- leave off for 12 hours before applying new patch)    [provider]  mupirocin ointment (BACTROBAN) 2 % Apply 1 application topically daily as needed (leg rash).  11/09/19   [provider]  Nutritional Supplements (FEEDING SUPPLEMENT, NEPRO CARB STEADY,) LIQD Take 237 mLs by mouth 2 (two) times daily between meals. 12/17/20   Lorella Nimrod, MD  promethazine (PHENERGAN) 12.5 MG tablet Take 1 tablet (12.5 mg total) by mouth every 6 (six) hours as needed for nausea or vomiting. Patient not taking: Reported on 09/06/2022 05/10/21   Wouk, Ailene Rud, MD   Allergies  Allergen Reactions   Ace Inhibitors Swelling and Anaphylaxis   Ativan [Lorazepam] Other (See Comments)    Reaction:  Hallucinations and headaches   Compazine [Prochlorperazine Edisylate] Anaphylaxis, Nausea And Vomiting and Other (See Comments)    Other reaction(s): dystonia from this vs. Reglan, 23 Jul - patient relates that she takes promethazine  frequently with no problems   Sumatriptan Succinate Other (See Comments)    Other reaction(s): delirium and hallucinations per [facility] records   Zofran [Ondansetron] Nausea And Vomiting    Per pt. she is allergic to zofran or will experience adverse reaction like hallucination    Losartan Nausea Only   Prochlorperazine Other (See Comments)    Reaction:  Unknown . Patient does not remember reaction but she does have vertigo and anxiety along with n and v at times. Could be used to treat any of these    Reglan [Metoclopramide] Other (See Comments)    Per patient her Dr. Evelina Bucy her off it    Scopolamine Other (See Comments)    Dizziness, also has vertigo already   Tape Rash    Plastic tape causes rash   Tapentadol Rash   Ultrasound Gel Itching    Patient states the Ultrasound gel caused itching while in skin contact and resolved when wiped away.   Review of Systems  Unable  to perform ROS   Physical Exam Pulmonary:     Effort: Pulmonary effort is normal.  Neurological:     Mental Status: She is alert.     Vital Signs: BP (!) 141/68   Pulse 63   Temp 98.1 F (36.7 C) (Oral)   Resp 16   Ht '5\' 5"'$  (1.651 m)   Wt 80.2 kg   SpO2 100%   BMI 29.42 kg/m  Pain Scale: 0-10 POSS *See Group Information*: 1-Acceptable,Awake and alert Pain Score: 0-No pain   SpO2: SpO2: 100 % O2 Device:SpO2: 100 % O2 Flow Rate: .O2 Flow Rate (L/min): 3 L/min  IO: Intake/output summary:  Intake/Output Summary (Last 24 hours) at 09/09/2022 1545 Last data filed at 09/09/2022 1123 Gross per 24 hour  Intake 120 ml  Output 1000 ml  Net -880 ml    LBM: Last BM Date : 09/08/22 Baseline Weight: Weight: 79.2 kg Most recent weight: Weight: 80.2 kg       Signed by: Asencion Gowda, NP   Please contact Palliative Medicine Team phone at [phone] for questions and concerns.  For individual provider: See Shea Evans

## 2022-09-10 DIAGNOSIS — E871 Hypo-osmolality and hyponatremia: Secondary | ICD-10-CM

## 2022-09-10 DIAGNOSIS — Z7189 Other specified counseling: Secondary | ICD-10-CM | POA: Diagnosis not present

## 2022-09-10 DIAGNOSIS — E1143 Type 2 diabetes mellitus with diabetic autonomic (poly)neuropathy: Secondary | ICD-10-CM | POA: Diagnosis not present

## 2022-09-10 DIAGNOSIS — R112 Nausea with vomiting, unspecified: Secondary | ICD-10-CM | POA: Diagnosis not present

## 2022-09-10 LAB — PHOSPHORUS: Phosphorus: 2.9 mg/dL (ref 2.5–4.6)

## 2022-09-10 LAB — GLUCOSE, CAPILLARY
Glucose-Capillary: 91 mg/dL (ref 70–99)
Glucose-Capillary: 111 mg/dL — ABNORMAL HIGH (ref 70–99)
Glucose-Capillary: 112 mg/dL — ABNORMAL HIGH (ref 70–99)
Glucose-Capillary: 96 mg/dL (ref 70–99)

## 2022-09-10 LAB — BASIC METABOLIC PANEL
Anion gap: 7 (ref 5–15)
BUN: 5 mg/dL — ABNORMAL LOW (ref 8–23)
CO2: 28 mmol/L (ref 22–32)
Calcium: 8.2 mg/dL — ABNORMAL LOW (ref 8.9–10.3)
Chloride: 95 mmol/L — ABNORMAL LOW (ref 98–111)
Creatinine, Ser: 3.06 mg/dL — ABNORMAL HIGH (ref 0.44–1.00)
GFR, Estimated: 17 mL/min — ABNORMAL LOW (ref >60)
Glucose, Bld: 113 mg/dL — ABNORMAL HIGH (ref 70–99)
Potassium: 3.2 mmol/L — ABNORMAL LOW (ref 3.5–5.1)
Sodium: 130 mmol/L — ABNORMAL LOW (ref 135–145)

## 2022-09-10 LAB — MAGNESIUM: Magnesium: 2.4 mg/dL (ref 1.7–2.4)

## 2022-09-10 MED ORDER — GERHARDT'S BUTT CREAM
TOPICAL_CREAM | Freq: Two times a day (BID) | CUTANEOUS | Status: DC
  Administered 2022-09-11 – 2022-09-12 (×3): 1 via TOPICAL
  Filled 2022-09-10: qty 1

## 2022-09-10 NOTE — Progress Notes (Signed)
Occupational Therapy Treatment Patient Details Name: [name] MRN: [id number] DOB: Aug 22, 1961 Today's Date: 09/10/2022   History of present illness Pt is a 61 y/o F admitted on 09/03/22 after presenting with c/o N&V. Pt is being treated for intractable N&V, likely acute gastritis that could be viral. PMH: ESRD on HD MWF, asthma, OA, chronic diastolic CHF, COPD, CAD, DM2, gastroparesis, GERD, diverticulosis, peripheral neuropathy   OT comments  Upon entering session, pt resting in bed and agreeable to OT. Pt with incontinent BM in bed. NT present to assist with linen change. Actively having BM while trying to get pt cleaned up. Max A to initate rolling L<>R then pt able to maintain with use of bed rails and Min guard. Pt endorsing significant pain in buttocks during peri care. Utilizing written communication with good outcome. Pt deferred grooming tasks and further mobility 2/2 pain and fatigue. Pt left as received with all needs in reach. Pt is making progress toward goal completion. D/C recommendation remains appropriate. OT will continue to follow acutely.     Recommendations for follow up therapy are one component of a multi-disciplinary discharge planning process, led by the attending physician.  Recommendations may be updated based on patient status, additional functional criteria and insurance authorization.    Follow Up Recommendations  Skilled nursing-short term rehab (<3 hours/day)     Assistance Recommended at Discharge Frequent or constant Supervision/Assistance  Patient can return home with the following  A lot of help with walking and/or transfers;A lot of help with bathing/dressing/bathroom   Equipment Recommendations  Other (comment) (defer to next venue of care)    Recommendations for Other Services      Precautions / Restrictions Precautions Precautions: Fall Restrictions Weight Bearing Restrictions: No       Mobility Bed Mobility Overal bed mobility: Needs  Assistance Bed Mobility: Rolling Rolling: Max assist         General bed mobility comments: Max A to initate rolling L<>R then pt able to maintain with use of bed rails and Min guard    Transfers                   General transfer comment: pt deferred     Balance Overall balance assessment: Needs assistance     Sitting balance - Comments: pt deferred                                   ADL either performed or assessed with clinical judgement   ADL Overall ADL's : Needs assistance/impaired                             Toileting- Clothing Manipulation and Hygiene: Total assistance;Bed level Toileting - Clothing Manipulation Details (indicate cue type and reason): posterior hygiene after BM            Extremity/Trunk Assessment Upper Extremity Assessment Upper Extremity Assessment: Generalized weakness   Lower Extremity Assessment Lower Extremity Assessment: Generalized weakness        Vision Patient Visual Report: No change from baseline     Perception     Praxis      Cognition Arousal/Alertness: Awake/alert Behavior During Therapy: Flat affect Overall Cognitive Status: Difficult to assess  Exercises      Shoulder Instructions       General Comments      Pertinent Vitals/ Pain       Pain Assessment Pain Assessment: Faces Faces Pain Scale: Hurts whole lot Pain Location: buttocks Pain Descriptors / Indicators: Aching, Discomfort, Grimacing, Guarding, Crying Pain Intervention(s): Limited activity within patient's tolerance, Monitored during session, Repositioned  Home Living                                          Prior Functioning/Environment              Frequency  Min 2X/week        Progress Toward Goals  OT Goals(current goals can now be found in the care plan section)  Progress towards OT goals: Progressing toward  goals  Acute Rehab OT Goals Patient Stated Goal: rest OT Goal Formulation: With patient Time For Goal Achievement: 09/20/22 Potential to Achieve Goals: Good  Plan Discharge plan remains appropriate;Frequency remains appropriate    Co-evaluation                 AM-PAC OT "6 Clicks" Daily Activity     Outcome Measure   Help from another person eating meals?: A Little Help from another person taking care of personal grooming?: A Little Help from another person toileting, which includes using toliet, bedpan, or urinal?: Total Help from another person bathing (including washing, rinsing, drying)?: A Lot Help from another person to put on and taking off regular upper body clothing?: A Little Help from another person to put on and taking off regular lower body clothing?: A Lot 6 Click Score: 14    End of Session    OT Visit Diagnosis: Muscle weakness (generalized) (M62.81);Pain   Activity Tolerance Patient limited by pain;Patient limited by fatigue   Patient Left in bed;with call bell/phone within reach;with bed alarm set   Nurse Communication Mobility status;Other (comment) (pain on buttocks from frequent BMs and peri care)        Time: 3335-4562 OT Time Calculation (min): 20 min  Charges: OT General Charges $OT Visit: 1 Visit OT Treatments $Self Care/Home Management : 8-22 mins  [employer] MS, OTR/L ascom [phone]  09/10/22, 3:11 PM

## 2022-09-10 NOTE — Progress Notes (Signed)
Daily Progress Note   Patient Name: [name]       Date: 09/10/2022 DOB: 19-Jul-1961  Age: 61 y.o. MRN#: [id number] Attending Physician: Annita Brod, MD Primary Care Physician: Denton Lank, MD Admit Date: 09/03/2022  Reason for Consultation/Follow-up: Establishing goals of care  Subjective: Notes and labs reviewed. In to see patient. I stood at bedside and called her with a nurse's ascom phone on her personal cell phone. She placed her phone on speaker phone and held it to her ear and was able to hear very well.    Discussed how things have been going since last meeting 04/2021. She discusses living alone, and her daughter and son. She states she has 10 grandchildren. She states her son comes to help her.  We discussed her diagnoses, prognosis, GOC, EOL wishes disposition and options.  Created space and opportunity for patient  to explore thoughts and feelings regarding current medical information.   A detailed discussion was had today regarding advanced directives.  Concepts specific to code status, artifical feeding and hydration, IV antibiotics and rehospitalization were discussed.  The difference between an aggressive medical intervention path and a comfort care path was discussed.  Values and goals of care important to patient and family were attempted to be elicited.  Discussed limitations of medical interventions to prolong quality of life in some situations and discussed the concept of human mortality.  She discusses frustration with diarrhea. She states apart from diarrhea,  she is tired. She states she is tired of dialysis, tired of coming to the hospital, and tired of living. She discusses her relationship with her son. She states he has stated helping her has became a  burden. She states he does not smile or laugh as he used to. She states he is lively when others are around, but their interactions when it is just them is very different. She states he has told her to keep going and keep pushing, but she is not that strong. I inquired about her daughter. She states "all she knows how to do is have babies", and changed the subject immediately. She states some of her sisters have died from dialysis. She states one of her sisters tells her she has had leg amputations, and if she can endure that, surely she can live with dialysis.  Offered a meeting with her son and daughter. She declines this stating they do not want to hear how she feels or what she wants herself, and want to do what they wish for her. She states nobody is living in her body but her. She further states she is tired and done. Inquired if she wanted to elect an HPOA, or do a living will and mark it to follow the document directly.  She states she has a best friend but is unsure if she would be willing to serve as HPOA as she has health issues herself. Discussed having the chaplain to follow up and talk with her further, and she is amenable. Discussed that I would follow up tomorrow.  Discussed that I will not call son or other family today as per our conversation, and she confirms this.   Length of Stay: 5  Current Medications: Scheduled Meds:   acidophilus  1 capsule Oral Daily   amLODipine  10 mg Oral Daily   Chlorhexidine Gluconate Cloth  6 each Topical Daily   cholecalciferol  1,000 Units Oral Daily   cyanocobalamin  1,000 mcg Oral Daily   feeding supplement (NEPRO CARB STEADY)  237 mL Oral BID BM   fluticasone  2 spray Each Nare Daily   Gerhardt's butt cream   Topical BID   heparin injection (subcutaneous)  5,000 Units Subcutaneous Q8H   hydrALAZINE  50 mg Oral BID   HYDROcodone-acetaminophen  1 tablet Oral Q8H   lipase/protease/amylase  72,000 Units Oral TID WC   metoCLOPramide (REGLAN)  injection  10 mg Intravenous Q6H   mirtazapine  7.5 mg Oral QHS   multivitamin  1 tablet Oral Daily   pantoprazole (PROTONIX) IV  40 mg Intravenous Q12H   promethazine  12.5 mg Oral Q6H    Continuous Infusions:  dextrose 30 mL/hr at 09/09/22 2206    PRN Meds: acetaminophen **OR** acetaminophen, albuterol, alum & mag hydroxide-simeth, hydrALAZINE, hydrOXYzine, lipase/protease/amylase **AND** lipase/protease/amylase, mouth rinse, polyethylene glycol, polyvinyl alcohol, traZODone  Physical Exam Pulmonary:     Effort: Pulmonary effort is normal.  Neurological:     Mental Status: She is alert.             Vital Signs: BP (!) 130/59 (BP Location: Left Wrist)   Pulse 65   Temp 98 F (36.7 C)   Resp 14   Ht '5\' 5"'$  (1.651 m)   Wt 80.2 kg   SpO2 100%   BMI 29.42 kg/m  SpO2: SpO2: 100 % O2 Device: O2 Device: Nasal Cannula O2 Flow Rate: O2 Flow Rate (L/min): 3 L/min  Intake/output summary:  Intake/Output Summary (Last 24 hours) at 09/10/2022 1502 Last data filed at 09/10/2022 0745 Gross per 24 hour  Intake 2832.31 ml  Output 0 ml  Net 2832.31 ml   LBM: Last BM Date : 09/10/22 Baseline Weight: Weight: 79.2 kg Most recent weight: Weight: 80.2 kg    Patient Active Problem List   Diagnosis Date Noted   Chronic diarrhea 09/09/2022   Hypomagnesemia 09/07/2022   Recurrent Clostridioides difficile infection 09/04/2022   Exocrine pancreatic insufficiency 09/04/2022   Essential hypertension 09/03/2022   Dyslipidemia 09/03/2022   Peripheral neuropathy 09/03/2022   Type 1 diabetes mellitus with hypoglycemia without coma, without long-term current use of insulin ([place]) 09/03/2022   Debility 07/04/2022   Acute colitis 07/02/2022   Venous ulcer of ankle, right (HCC) 06/30/2022   Pericardial effusion 12/05/2021   Hearing impairment 12/04/2021   Hyponatremia 11/27/2021   COVID-19 virus  infection 11/27/2021   Bacterial pneumonia 11/27/2021   Acute GI bleeding 11/15/2021   Upper  GI bleed 11/15/2021   Anemia of chronic kidney failure 05/03/2021   Thrombocytopenia ([place]) 05/03/2021   Severe sepsis with septic shock ([place]) 04/25/2021   HCAP (healthcare-associated pneumonia) 27/11/5007   Acute metabolic encephalopathy 38/18/2993   UTI (urinary tract infection) 03/29/2021   Chronic respiratory failure with hypoxia ([place]) 12/18/2020   Superior vena caval syndrome 12/14/2020   Chronic diastolic CHF (congestive heart failure) ([place]) 12/04/2020   Bradycardia 09/17/2020   Advanced care planning/counseling discussion    Discitis of thoracic region    Fluid overload 08/10/2020   HLD (hyperlipidemia) 08/10/2020   Chest pain 08/10/2020   CAD (coronary artery disease) 08/10/2020   GERD (gastroesophageal reflux disease) 08/10/2020   Obesity (BMI 30-39.9) 06/06/2020   Anemia in ESRD (end-stage renal disease) ([place]) 05/31/2020   Type 1 diabetes mellitus ([place]) 05/31/2020   End-stage renal disease ([place]) 05/31/2020   Hypertensive disorder 05/31/2020   Arm pain 05/21/2020   Cellulitis of right leg 05/02/2020   ESRD (end stage renal disease) ([employer]) 04/13/2020   SOBOE (shortness of breath on exertion) 02/08/2020   Hematoma of arm, left, subsequent encounter 11/24/2019   Respiratory failure (HCC)    Shortness of breath    Complication of arteriovenous dialysis fistula 11/13/2019   PVD (peripheral vascular disease) ([place]) 11/13/2019   Severe anemia 11/13/2019   Pressure ulcer, stage 2 ([place]) 11/13/2019   Hemorrhagic shock ([place]) 11/11/2019   Leg pain 10/02/2019   CAP (community acquired pneumonia) 10/01/2019   Atherosclerosis of native arteries of extremity with intermittent claudication ([place]) 05/01/2019   Right-sided headache    COPD (chronic obstructive pulmonary disease) ([place]) 10/06/2018   Syncope 10/04/2018   Symptomatic anemia 06/18/2018   Gastroparesis due to DM ([place]) 71/69/6789   Complication of vascular access for dialysis 12/04/2017   Osteomyelitis ([place]) 09/30/2017    Carotid stenosis 06/18/2017   Bilateral carotid artery stenosis 06/16/2017   Benign essential HTN 05/19/2017   CKD (chronic kidney disease) stage 5, GFR less than 15 ml/min (HCC) 05/19/2017   Hyperlipidemia, mixed 05/19/2017   Shortness of breath 05/04/2017   Rash, skin 12/16/2016   Cellulitis of lower extremity 07/29/2016   Chronic venous insufficiency 07/29/2016   Lymphedema 07/29/2016   TIA (transient ischemic attack) 04/21/2016   Altered mental status 04/08/2016   Hyperammonemia ([place]) 04/08/2016   Elevated troponin 04/08/2016   Depression 04/08/2016   Depression, major, recurrent, severe with psychosis ([place]) 04/08/2016   Blood in stool    Intractable cyclical vomiting with nausea    Reflux esophagitis    Gastritis    Generalized abdominal pain    Uncontrollable vomiting    Major depressive disorder, recurrent episode, moderate ([place]) 03/15/2016   Adjustment disorder with mixed anxiety and depressed mood 03/15/2016   Malnutrition of moderate degree 12/01/2015   Renal mass    Dyspnea    Acute renal failure ([place])    Respiratory failure ([place])    Fever 11/14/2015   Encounter for central line placement    Encounter for orogastric (OG) tube placement    Nausea 11/12/2015   Hyperkalemia 10/03/2015   Diarrhea, unspecified 07/22/2015   Hypoglycemia 04/24/2015   Unresponsiveness 04/24/2015   Sinus bradycardia 04/24/2015   Hypothermia 04/24/2015   Acute hypoxemic respiratory failure ([place]) 04/24/2015   Acute on chronic diastolic CHF (congestive heart failure) ([place]) 04/05/2015   Diabetic gastroparesis ([place]) 04/05/2015   Hypokalemia 04/05/2015   Physical debility 04/05/2015  Acute pulmonary edema (HCC) 04/03/2015   Nausea and vomiting 04/03/2015   Hypoglycemia associated with diabetes ([place]) 04/03/2015   Anemia of chronic disease 04/03/2015   Secondary hyperparathyroidism ([place]) 04/03/2015   Pressure ulcer 04/02/2015   Acute respiratory failure with hypoxia ([place]) 04/01/2015    Adjustment disorder with anxiety 03/14/2015   Somatic symptom disorder, mild 03/08/2015   Coronary artery disease involving native coronary artery of native heart without angina pectoris    Nausea & vomiting 03/06/2015   Abdominal pain 03/06/2015   Hypoglycemia in type 2 diabetes mellitus , without long-term current use of insulin ([place]) 03/06/2015   HTN (hypertension) 03/06/2015   Gastroparesis 02/24/2015   Pleural effusion 02/19/2015   Bacteremia due to Enterococcus 02/19/2015   End-stage renal disease on hemodialysis [facility]) 02/19/2015   Diarrhea 08/01/2014    Palliative Care Assessment & Plan   Recommendations/Plan: Have spoken with chaplain about seeing patient. Patient considering completing HPOA papers for her friend to be surrogate decision maker. As per out conversation, I will not reach out to family today.   Code Status:    Code Status Orders  (From admission, onward)           Start     Ordered   09/03/22 2022  Full code  Continuous       Question:  By:  Answer:  Consent: discussion documented in EHR   09/03/22 2033           Code Status History     Date Active Date Inactive Code Status Order ID Comments User Context   07/02/2022 0040 07/09/2022 0103 Full Code [id number]  Athena Masse, MD ED   04/28/2022 2140 05/05/2022 2307 Full Code [id number]  Para Skeans, MD ED   11/27/2021 0542 12/07/2021 0225 Full Code [id number]  Athena Masse, MD ED   11/15/2021 1739 11/17/2021 1743 Full Code [id number]  Sharen Hones, MD ED   04/30/2021 1444 05/10/2021 2330 DNR [id number]  Milus Banister, NP Inpatient   04/26/2021 1432 04/30/2021 1444 Full Code [id number]  Ottie Glazier, MD ED   04/25/2021 2123 04/26/2021 1432 Full Code [id number]  Howerter, Ethelda Chick, DO ED   03/29/2021 0244 04/02/2021 2300 Full Code [id number]  Athena Masse, MD ED   12/04/2020 2042 12/17/2020 2147 DNR [id number]  Ileene Musa T, DO ED   12/04/2020 1939 12/04/2020 2042 Full Code [id number]  Orene Desanctis, DO ED   09/17/2020  1431 09/21/2020 1925 Full Code [id number]  Tyna Jaksch, MD ED   08/10/2020 1323 08/17/2020 2223 Full Code [id number]  Ivor Costa, MD ED   06/02/2020 2251 06/25/2020 2316 Full Code [id number]  Elwyn Reach, MD ED   05/22/2020 1201 05/23/2020 2050 Full Code [id number]  Vashti Hey, MD ED   05/02/2020 2057 05/06/2020 1929 Full Code [id number]  Mansy, Arvella Merles, MD ED   04/13/2020 1458 04/14/2020 2239 Full Code [id number]  Sela Hua, PA-C Inpatient   11/24/2019 2038 11/30/2019 1824 Full Code [id number]  Artist Beach, MD Inpatient   11/11/2019 0715 11/19/2019 0006 Full Code [id number]  Awilda Bill, NP ED   09/30/2019 2126 10/01/2019 2257 Full Code [id number]  Nolberto Hanlon, MD ED   10/04/2018 1847 10/12/2018 0027 Full Code [id number]  Dustin Flock, MD Inpatient   06/18/2018 0449 06/20/2018 1642 Full Code [id number]  Arta Silence, MD ED   02/12/2018 1312 02/16/2018 0050 Full Code [id number]  Epifanio Lesches, MD ED   01/17/2018 1848 01/21/2018 1341 Full Code  [id number]  Vaughan Basta, MD Inpatient   09/30/2017 1804 10/21/2017 2238 Full Code [id number]  Vaughan Basta, MD Inpatient   09/03/2017 2243 09/05/2017 1901 Full Code [id number]  Lance Coon, MD Inpatient   05/04/2017 1833 05/05/2017 2330 Full Code [id number]  Henreitta Leber, MD Inpatient   03/23/2017 1551 03/25/2017 1739 Full Code [id number]  Gladstone Lighter, MD Inpatient   04/21/2016 1559 04/21/2016 1717 Full Code [id number]  Nicholes Mango, MD Inpatient   04/08/2016 1415 04/11/2016 1947 Full Code [id number]  Theodoro Grist, MD Inpatient   03/15/2016 1322 03/20/2016 0019 Full Code [id number]  Bettey Costa, MD Inpatient   02/16/2016 1409 02/19/2016 2238 Full Code [id number]  Bettey Costa, MD ED   12/17/2015 1139 12/21/2015 2209 Full Code [id number]  Dustin Flock, MD ED   11/12/2015 1602 12/01/2015 1943 Full Code [id number]  Bettey Costa, MD Inpatient   10/07/2015 0937 10/12/2015 1959 Full Code [id number]  Fritzi Mandes, MD ED    10/03/2015 0355 10/05/2015 2230 Full Code [id number]  Lance Coon, MD Inpatient   08/31/2015 1600 09/03/2015 2209 Full Code [id number]  Demetrios Loll, MD ED   07/26/2015 1528 07/26/2015 2136 Full Code [id number]  Dionisio David, MD Inpatient   07/22/2015 1736 07/26/2015 1528 Full Code [id number]  Vaughan Basta, MD Inpatient   06/05/2015 2039 06/07/2015 2003 Full Code [id number]  Bettey Costa, MD Inpatient   05/21/2015 2142 05/24/2015 1626 Full Code [id number]  Henreitta Leber, MD Inpatient   04/24/2015 0855 04/26/2015 2211 Full Code [id number]  Demetrios Loll, MD Inpatient   04/01/2015 1558 04/05/2015 2007 Full Code [id number]  Epifanio Lesches, MD ED   03/06/2015 0805 03/09/2015 2111 Full Code [id number]  Juluis Mire, MD Inpatient   02/19/2015 1614 02/24/2015 1741 Full Code [id number]  Aldean Jewett, MD Inpatient       Prognosis:  Poor    Care plan was discussed with chaplain and RN  Thank you for allowing the Palliative Medicine Team to assist in the care of this patient.     Asencion Gowda, NP  Please contact Palliative Medicine Team phone at [phone] for questions and concerns.

## 2022-09-10 NOTE — Consult Note (Signed)
[facility] Nurse Consult Note: Reason for Consult:Incontinence associated dermatitis (IAD)  in the setting of chronic diarrhea and recurrent C diff.  Was admitted for intractable nausea and vomiting but no diarrhea. Son states that his mother has large volume diarrhea after PO intake at home. Currently being monitored for gastroparesis and colitis.  No diarrhea noted.  Previous episodes of diarrhea have led to full thickness skin breakdown, erythema and tenderness.  Wound type:  IAD from chronic diarrhea Pressure Injury POA: NA Measurement: bilateral gluteal folds and around rectum Wound bed: red and moist Drainage (amount, consistency, odor) minimal weeping Periwound: frequently moist Dressing procedure/placement/frequency: Cleanse buttocks with soap and water and pat dry. NO DISPOSABLE Briefs or underpads.  Contact with dermatherapy therapeutic linen will improve skin microclimate and promote healing.  Gerhardts paste twice daily and PRN soilage.  Will not follow at this time.  Please re-consult if needed.  Estrellita Ludwig MSN, RN, FNP-BC CWON Wound, Ostomy, Continence Nurse [facility] [phone] Pager [phone]

## 2022-09-10 NOTE — Progress Notes (Signed)
[facility]  ROUNDING NOTE   Subjective:   [name] is a 61 y.o., female with past medical history of anemia, asthma, diabetes, CAD, PAD, GERD, and end stage renal disease on dialysis.  Patient arrived to emergency department via EMS with complaints of abdominal pain.  Patient has been admitted for Generalized abdominal pain [R10.84] Intractable nausea and vomiting [R11.2] Gastritis without bleeding, unspecified chronicity, unspecified gastritis type [K29.70]  Patient is known to our practice and receives outpatient dialysis treatments at [facility] on a TTS schedule, supervised by Dr. Holley Raring.    Patient seen resting quietly Nausea has improved Tolerating small meals  Objective:  Vital signs in last 24 hours:  Temp:  [98 F (36.7 C)-98.5 F (36.9 C)] 98 F (36.7 C) (12/20 0820) Pulse Rate:  [62-70] 65 (12/20 0820) Resp:  [14-18] 14 (12/20 0820) BP: (124-130)/(42-59) 130/59 (12/20 0820) SpO2:  [100 %] 100 % (12/20 0820)  Weight change: -0.3 kg Filed Weights   09/08/22 0834 09/09/22 0751 09/09/22 1123  Weight: 81.5 kg 81.2 kg 80.2 kg    Intake/Output: I/O last 3 completed shifts: In: 120 [P.O.:120] Out: 1000 [Other:1000]   Intake/Output this shift:  Total I/O In: 2832.3 [I.V.:2832.3] Out: -   Physical Exam: General: NAD  Head: Normocephalic, atraumatic.  Dry oral mucosal membranes  Eyes: Anicteric  Lungs:  Clear to auscultation. Normal effort, [place] O2  Heart: Regular rate and rhythm  Abdomen:  Soft, nontender  Extremities:  No peripheral edema.  Neurologic: Nonfocal, moving all four extremities  Skin: No lesions, dry  Access: Rt permcath    Basic Metabolic Panel: Recent Labs  Lab 09/06/22 0947 09/07/22 0431 09/08/22 0549 09/08/22 1518 09/09/22 0306 09/10/22 1305  NA 131* 128* 125* 131* 129* 130*  K 3.5 3.8 3.6  --  3.7 3.2*  CL 94* 94* 93*  --  97* 95*  CO2 '30 28 26  '$ --  27 28  GLUCOSE 84 154* 76  --  72 113*  BUN 5* 7* 9  --   7* 5*  CREATININE 3.75* 4.18* 4.66*  --  3.80* 3.06*  CALCIUM 7.9* 7.5* 7.9*  --  7.7* 8.2*  MG  --  1.5* 1.8  --  1.6* 2.4  PHOS  --  2.4*  --   --  1.9* 2.9     Liver Function Tests: No results for input(s): "AST", "ALT", "ALKPHOS", "BILITOT", "PROT", "ALBUMIN" in the last 168 hours.   No results for input(s): "LIPASE", "AMYLASE" in the last 168 hours.   No results for input(s): "AMMONIA" in the last 168 hours.  CBC: Recent Labs  Lab 09/04/22 0408 09/06/22 0947 09/08/22 0549 09/09/22 0809  WBC 4.6 4.9 5.0 4.2  HGB 9.2* 8.7* 8.5* 7.8*  HCT 27.7* 27.3* 25.6* 24.2*  MCV 78.9* 80.8 77.8* 79.3*  PLT 115* 118* 152 113*     Cardiac Enzymes: No results for input(s): "CKTOTAL", "CKMB", "CKMBINDEX", "TROPONINI" in the last 168 hours.   BNP: Invalid input(s): "POCBNP"  CBG: Recent Labs  Lab 09/09/22 1329 09/09/22 1646 09/09/22 2204 09/10/22 0821 09/10/22 1127  GLUCAP 73 82 89 91 111*      Microbiology: Results for orders placed or performed during the hospital encounter of 08/24/22  Gastrointestinal Panel by PCR , Stool     Status: None   Collection Time: 08/24/22  4:20 PM   Specimen: Stool  Result Value Ref Range Status   Campylobacter species NOT DETECTED NOT DETECTED Final   Plesimonas  shigelloides NOT DETECTED NOT DETECTED Final   Salmonella species NOT DETECTED NOT DETECTED Final   Yersinia enterocolitica NOT DETECTED NOT DETECTED Final   Vibrio species NOT DETECTED NOT DETECTED Final   Vibrio cholerae NOT DETECTED NOT DETECTED Final   Enteroaggregative E coli (EAEC) NOT DETECTED NOT DETECTED Final   Enteropathogenic E coli (EPEC) NOT DETECTED NOT DETECTED Final   Enterotoxigenic E coli (ETEC) NOT DETECTED NOT DETECTED Final   Shiga like toxin producing E coli (STEC) NOT DETECTED NOT DETECTED Final   Shigella/Enteroinvasive E coli (EIEC) NOT DETECTED NOT DETECTED Final   Cryptosporidium NOT DETECTED NOT DETECTED Final   Cyclospora cayetanensis NOT  DETECTED NOT DETECTED Final   Entamoeba histolytica NOT DETECTED NOT DETECTED Final   Giardia lamblia NOT DETECTED NOT DETECTED Final   Adenovirus F40/41 NOT DETECTED NOT DETECTED Final   Astrovirus NOT DETECTED NOT DETECTED Final   Norovirus GI/GII NOT DETECTED NOT DETECTED Final   Rotavirus A NOT DETECTED NOT DETECTED Final   Sapovirus (I, II, IV, and V) NOT DETECTED NOT DETECTED Final    Comment: Performed at [facility], [address] [place], [place] [address]  C Difficile Quick Screen w PCR reflex     Status: Abnormal   Collection Time: 08/24/22  4:20 PM   Specimen: STOOL  Result Value Ref Range Status   C Diff antigen POSITIVE (A) NEGATIVE Final   C Diff toxin NEGATIVE NEGATIVE Final   C Diff interpretation Results are indeterminate. See PCR results.  Final    Comment: Performed at [facility], [address] [place], [place] [address]  C. Diff by PCR, Reflexed     Status: Abnormal   Collection Time: 08/24/22  4:20 PM  Result Value Ref Range Status   Toxigenic C. Difficile by PCR POSITIVE (A) NEGATIVE Final    Comment: Positive for toxigenic C. difficile with little to no toxin production. Only treat if clinical presentation suggests symptomatic illness. Performed at [facility], [address] [place], [place] [address]     Coagulation Studies: No results for input(s): "LABPROT", "INR" in the last 72 hours.   Urinalysis: No results for input(s): "COLORURINE", "LABSPEC", "PHURINE", "GLUCOSEU", "HGBUR", "BILIRUBINUR", "KETONESUR", "PROTEINUR", "UROBILINOGEN", "NITRITE", "LEUKOCYTESUR" in the last 72 hours.  Invalid input(s): "APPERANCEUR"    Imaging: No results found.   Medications:    dextrose 30 mL/hr at 09/09/22 2206     acidophilus  1 capsule Oral Daily   amLODipine  10 mg Oral Daily   Chlorhexidine Gluconate Cloth  6 each Topical Daily   cholecalciferol  1,000 Units Oral Daily   cyanocobalamin  1,000 mcg Oral Daily    feeding supplement (NEPRO CARB STEADY)  237 mL Oral BID BM   fluticasone  2 spray Each Nare Daily   Gerhardt's butt cream   Topical BID   heparin injection (subcutaneous)  5,000 Units Subcutaneous Q8H   hydrALAZINE  50 mg Oral BID   HYDROcodone-acetaminophen  1 tablet Oral Q8H   lipase/protease/amylase  72,000 Units Oral TID WC   metoCLOPramide (REGLAN) injection  10 mg Intravenous Q6H   mirtazapine  7.5 mg Oral QHS   multivitamin  1 tablet Oral Daily   pantoprazole (PROTONIX) IV  40 mg Intravenous Q12H   promethazine  12.5 mg Oral Q6H   acetaminophen **OR** acetaminophen, albuterol, alum & mag hydroxide-simeth, hydrALAZINE, hydrOXYzine, lipase/protease/amylase **AND** lipase/protease/amylase, mouth rinse, polyethylene glycol, polyvinyl alcohol, traZODone  Assessment/ Plan:  [name] is a  61 y.o.  female with past medical history of anemia, asthma, diabetes, CAD, PAD, GERD, and end stage renal disease on dialysis.  Patient arrived to emergency department via EMS after being found at home by grandson with multiple space heaters deterrent to high setting and patient wrapped in heated blanket.  Patient has been admitted for Generalized abdominal pain [R10.84] Intractable nausea and vomiting [R11.2] Gastritis without bleeding, unspecified chronicity, unspecified gastritis type [K29.70]  [facility] [place]/TTS/Rt Permcath  End-stage renal disease on hemodialysis.    Next treatment scheduled for Thursday.  2. Anemia of chronic kidney disease Lab Results  Component Value Date   HGB 7.8 (L) 09/09/2022    Patient receives [place] outpatient.  Hemoglobin not within target.  Will order EPO with dialysis treatments.  3. Secondary Hyperparathyroidism: with outpatient labs: PTH 485, phosphorus 3.9, calcium 8.5 on 06/03/22.   Lab Results  Component Value Date   PTH 357 (H) 10/05/2018   CALCIUM 8.2 (L) 09/10/2022   CAION 1.09 (L) 04/13/2020   PHOS 2.9 09/10/2022   Monitoring bone  minerals during this admission.  Bone minerals slowly improving, Continue to hold binders.   4. Diabetes mellitus type II with chronic kidney disease/renal manifestations:noninsulin dependent.   Glucose stable.Remains on D10 infusion. Primary team to manage sliding scale insulin.  5.  Hypertension with chronic kidney disease.     Currently prescribed amlodipine, furosemide, and hydralazine.  Blood pressure 130/59     LOS: 5   12/20/20232:29 PM

## 2022-09-10 NOTE — TOC Progression Note (Signed)
Transition of Care [facility]) - Progression Note    Patient Details  Name: [name] MRN: [id number] Date of Birth: November 28, 1960  Transition of Care [facility]) CM/SW [place], RN Phone Number: 09/10/2022, 3:16 PM  Clinical Narrative:    Went into the room and tried to speak to the patient She is very Hard of hearing and asked that I call her on her cell phone, I called her on her cell phone and she could hear me, I explained that I only have one options for rehab in [facility], the rest are out of the county, she stated that she has been to [facility] before, I reviewed the bed options in the other counties, She stated that she will call and discuss with her sister and let me know        Expected Discharge Plan and Services                                               Social Determinants of Health (SDOH) Interventions SDOH Screenings   Food Insecurity: No Food Insecurity (09/04/2022)  Recent Concern: [place] Present (07/03/2022)  Housing: Low Risk  (09/04/2022)  Transportation Needs: No Transportation Needs (09/04/2022)  Utilities: Not At Risk (09/04/2022)  Financial Resource Strain: Low Risk  (02/12/2018)  Physical Activity: Insufficiently Active (02/12/2018)  Social Connections: Moderately Integrated (02/12/2018)  Stress: Stress Concern Present (02/12/2018)  Tobacco Use: Low Risk  (09/04/2022)    Readmission Risk Interventions    05/03/2021    3:02 PM 12/11/2020    9:23 AM 09/21/2020    9:39 AM  Readmission Risk Prevention Plan  Transportation Screening Complete Complete Complete  Medication Review Press photographer) Complete Complete Complete  PCP or Specialist appointment within 3-5 days of discharge  Complete Complete  HRI or Home Care Consult  Complete Complete  SW Recovery Care/Counseling Consult  Complete Complete  Palliative Care Screening Complete Not Applicable Complete  [place]   Not Complete Complete  SNF Comments  working on placement

## 2022-09-10 NOTE — Progress Notes (Signed)
Progress Note   Patient: [name] MVE:[id number] DOB: 09/15/1961 DOA: 09/03/2022     5 DOS: the patient was seen and examined on 09/10/2022   Brief hospital course: [name] is a 61 y.o. female with medical history significant for end-stage renal disease on hemodialysis on MWF, asthma, osteoarthritis, chronic diastolic CHF, COPD, coronary artery disease, type 2 diabetes mellitus, gastroparesis, GERD, diverticulosis and peripheral neuropathy, who presented to the ER 09/03/2022 for evaluation of intractable nausea and vomiting since evening before.  No bilious vomitus or hematemesis.  She admits to epigastric abdominal pain and denies any diarrhea.  Gastroenterology consulted for persistent N/V and several chronic GI issues with planned outpatient endoscopies scheduled.  Patient appears to have improvement with scheduled Phenergan and Reglan.    Assessment and Plan: * Intractable nausea and vomiting-resolved as of 09/10/2022 Uncontrolled gastroparesis.  Taken off Reglan outpatient for unclear reasons.  Much improved with Phenergan and Reglan.  Gastroparesis due to DM [facility]) Documented allergy to Reglan. Not on motility agent. PO Phenergan scheduled (as of 12/16 to monitor improvement in N/V, dry heaves). GI is following. Trial Reglan while here (documented allergy vs intolerance but remote & pt does not recall details)  Nausea and vomiting doing much better with Reglan and scheduled Phenergan.  Type 1 diabetes mellitus with hypoglycemia without coma, without long-term current use of insulin (HCC) Hypoglycemia due to N/V and lack of PO intake. 12/19: sugars only 70-90's on D10 drip --Hypoglycemic protocol --Continue D10W at 30 cc /hr --Hold off insulin for now --Wean off D10W as sugars tolerate --Encourage PO intake  --D/c'd sliding scale orders  Hyponatremia Felt to be secondary to progressive volume status and not tolerating dialysis prior to admission.  Currently at 130.     Hypomagnesemia Replacing IV Mg-sulfate for Mg 1.5 (12/17). Monitor Mg level, replace PRN  Essential hypertension Continue her antihypertensives. PRN IV hydralazine ordered.  End-stage renal disease on hemodialysis [place]) Nephrology consulted for dialysis.  Dyslipidemia Continue statin  Peripheral neuropathy Continue Neurontin.  Chronic diarrhea Pt has hx of recurrent C diff. Follows with GI for multiple chronic GI issues.   Last C diff screen positive, but pt was not having diarrhea or signs of infection, GI recommended no treatment.  Son reports sudden onset diarrhea right after PO intake at home.   ?IBS - consider Bentyl trial. GI following.  Rectal tube for skin breakdown.  Exocrine pancreatic insufficiency Follows with GI. Continue Creon  Recurrent Clostridioides difficile infection Not likely active infection.  She is followed by GI for recurrent C diff and had repeat stool studies on 12/3 which were antigen positive, toxin negative, but PCR positive. Pt has intractable N/V but has denied recent diarrhea.  No treatment indicated at this time.          Subjective: Cannot hear me very well.   Physical Exam: Vitals:   09/09/22 1645 09/09/22 2127 09/09/22 2310 09/10/22 0820  BP: (!) 125/50 (!) 124/54 (!) 124/42 (!) 130/59  Pulse: 70 65 62 65  Resp: '16 18 16 14  '$ Temp: 98.5 F (36.9 C) 98.1 F (36.7 C) 98.4 F (36.9 C) 98 F (36.7 C)  TempSrc:      SpO2: 100% 100% 100% 100%  Weight:      Height:       General exam: No acute distress HEENT: Hard of hearing Respiratory system: Clear to auscultate bilaterally Cardiovascular system: Regular rate and rhythm, S1-S2 Gastrointestinal system: Soft, nontender nondistended, hypoactive bowel sounds Central  nervous system: no gross focal neurologic deficits, normal speech Extremities: Chronic venous stasis. Psychiatry: normal mood, flat affect   Data Reviewed: Sodium 130, potassium 3.2, creatinine  3.06.   Upper GI series 12/18 -- study was severely limited by patient's poor hearing and inability to hear and follow instructions.  Esophagus structurally unremarkable but showed moderate dysmotility. Contrast did not move to the gastric fundus after 10 minutes (consistent with gastroparesis).  Note - Recent C diff antigen positive, PCR positive, toxin negative.   Pt not having diarrhea but has history of recurrent C diff.   GI recommended no treatment.   Family Communication: Will call son  Disposition: Status is: Inpatient  Remains admitted due to - persistent electrolyte derangements. N/V improved with Reglan and Phenergan, monitoring response to medication changes.   Anticipate medically ready to d/c in 24-48 hours pending improvement.     Planned Discharge Destination: SNF recommended      Author: Annita Brod, MD 09/10/2022 2:35 PM  For on call review [email or web address].

## 2022-09-10 NOTE — Progress Notes (Signed)
   09/10/22 1500  Clinical Encounter Type  Visited With Patient  Visit Type Initial  Referral From Palliative care team  Consult/Referral To [place] responded to request to visit patient. Patient has difficult time hearing but requested prayer as she is tired and wants to be in heaven. Chaplain attempted to talk about HCPOA but patient was not able to engage in this conversation at this time.

## 2022-09-11 DIAGNOSIS — E871 Hypo-osmolality and hyponatremia: Secondary | ICD-10-CM | POA: Diagnosis not present

## 2022-09-11 DIAGNOSIS — E1143 Type 2 diabetes mellitus with diabetic autonomic (poly)neuropathy: Secondary | ICD-10-CM | POA: Diagnosis not present

## 2022-09-11 DIAGNOSIS — R112 Nausea with vomiting, unspecified: Secondary | ICD-10-CM | POA: Diagnosis not present

## 2022-09-11 DIAGNOSIS — Z7189 Other specified counseling: Secondary | ICD-10-CM | POA: Diagnosis not present

## 2022-09-11 LAB — CBC
HCT: 22.1 % — ABNORMAL LOW (ref 36.0–46.0)
Hemoglobin: 7.4 g/dL — ABNORMAL LOW (ref 12.0–15.0)
MCH: 26.1 pg (ref 26.0–34.0)
MCHC: 33.5 g/dL (ref 30.0–36.0)
MCV: 78.1 fL — ABNORMAL LOW (ref 80.0–100.0)
Platelets: 180 10*3/uL (ref 150–400)
RBC: 2.83 MIL/uL — ABNORMAL LOW (ref 3.87–5.11)
RDW: 19 % — ABNORMAL HIGH (ref 11.5–15.5)
WBC: 10.6 10*3/uL — ABNORMAL HIGH (ref 4.0–10.5)
nRBC: 0 % (ref 0.0–0.2)

## 2022-09-11 LAB — GLUCOSE, CAPILLARY
Glucose-Capillary: 87 mg/dL (ref 70–99)
Glucose-Capillary: 76 mg/dL (ref 70–99)
Glucose-Capillary: 133 mg/dL — ABNORMAL HIGH (ref 70–99)
Glucose-Capillary: 114 mg/dL — ABNORMAL HIGH (ref 70–99)
Glucose-Capillary: 94 mg/dL (ref 70–99)

## 2022-09-11 LAB — BASIC METABOLIC PANEL
Anion gap: 8 (ref 5–15)
BUN: 7 mg/dL — ABNORMAL LOW (ref 8–23)
CO2: 27 mmol/L (ref 22–32)
Calcium: 8 mg/dL — ABNORMAL LOW (ref 8.9–10.3)
Chloride: 95 mmol/L — ABNORMAL LOW (ref 98–111)
Creatinine, Ser: 3.73 mg/dL — ABNORMAL HIGH (ref 0.44–1.00)
GFR, Estimated: 13 mL/min — ABNORMAL LOW (ref >60)
Glucose, Bld: 93 mg/dL (ref 70–99)
Potassium: 3.2 mmol/L — ABNORMAL LOW (ref 3.5–5.1)
Sodium: 130 mmol/L — ABNORMAL LOW (ref 135–145)

## 2022-09-11 MED ORDER — POTASSIUM CHLORIDE 20 MEQ PO PACK
20.0000 meq | PACK | ORAL | Status: AC
  Administered 2022-09-11 (×2): 20 meq via ORAL
  Filled 2022-09-11 (×2): qty 1

## 2022-09-11 MED ORDER — EPOETIN ALFA 10000 UNIT/ML IJ SOLN: 10000.0000 [IU] | INTRAMUSCULAR | Status: DC

## 2022-09-11 NOTE — Progress Notes (Signed)
Progress Note   Patient: [name] [id number]:[id number] DOB: 1961/02/08 DOA: 09/03/2022     6 DOS: the patient was seen and examined on 09/11/2022   Brief hospital course: [name] is a 61 y.o. female with medical history significant for end-stage renal disease on hemodialysis on MWF, asthma, osteoarthritis, chronic diastolic CHF, COPD, coronary artery disease, type 2 diabetes mellitus, gastroparesis, GERD, diverticulosis and peripheral neuropathy, who presented to the ER 09/03/2022 for evaluation of intractable nausea and vomiting since evening before.  No bilious vomitus or hematemesis.  She admits to epigastric abdominal pain and denies any diarrhea.  Gastroenterology consulted for persistent N/V and several chronic GI issues with planned outpatient endoscopies scheduled.  Patient appears to have improvement with scheduled Phenergan and Reglan.  Palliative care has been following patient and after extensive discussions, patient states that she is tired of having dialysis.  She has mentioned this before to family and even to the staff at the dialysis center, but they seem to dismiss her wishes.  She states that she has concerns about her children keeping her going should she stop treatments.  After extensive discussion patient has opted for living will which prevents deviating from her wishes so that she can stop her dialysis.  Assessment and Plan: * Intractable nausea and vomiting-resolved as of 09/10/2022 Uncontrolled gastroparesis.  Taken off Reglan outpatient for unclear reasons.  Much improved with Phenergan and Reglan.  Gastroparesis due to DM [facility]) Documented allergy to Reglan. Not on motility agent. PO Phenergan scheduled (as of 12/16 to monitor improvement in N/V, dry heaves). GI is following. Trial Reglan while here (documented allergy vs intolerance but remote & pt does not recall details)  Nausea and vomiting doing much better with Reglan and scheduled Phenergan.  Type 1 diabetes  mellitus with hypoglycemia without coma, without long-term current use of insulin (HCC) Hypoglycemia due to N/V and lack of PO intake. 12/19: sugars only 70-90's on D10 drip --Hypoglycemic protocol --Continue D10W at 30 cc /hr --Hold off insulin for now --Wean off D10W as sugars tolerate --Encourage PO intake  --D/c'd sliding scale orders  Hyponatremia Felt to be secondary to progressive volume status and not tolerating dialysis prior to admission.  Currently at 130.    Hypomagnesemia Replacing IV Mg-sulfate for Mg 1.5 (12/17). Monitor Mg level, replace PRN  Essential hypertension Continue her antihypertensives. PRN IV hydralazine ordered.  End-stage renal disease on hemodialysis [facility]) Nephrology consulted for dialysis.  After discussion with plan of care patient is [place] that she wishes to stop dialysis and has taken steps to ensure that her wishes are followed including a living well  Dyslipidemia Continue statin  Peripheral neuropathy Continue Neurontin.  Chronic diarrhea Pt has hx of recurrent C diff. Follows with GI for multiple chronic GI issues.   Last C diff screen positive, but pt was not having diarrhea or signs of infection, GI recommended no treatment.  Son reports sudden onset diarrhea right after PO intake at home.   ?IBS - consider Bentyl trial. GI following.  Rectal tube for skin breakdown.  Exocrine pancreatic insufficiency Follows with GI. Continue Creon  Recurrent Clostridioides difficile infection Not likely active infection.  She is followed by GI for recurrent C diff and had repeat stool studies on 12/3 which were antigen positive, toxin negative, but PCR positive. Pt has intractable N/V but has denied recent diarrhea.  No treatment indicated at this time.          Subjective: Cannot hear me very  well.   Physical Exam: Vitals:   09/10/22 2351 09/11/22 0423 09/11/22 1106 09/11/22 1449  BP: (!) 112/48  (!) 133/45 (!) 126/53  Pulse:  100  76 74  Resp: '16  16 16  '$ Temp: 98.2 F (36.8 C) 98.9 F (37.2 C) 99.6 F (37.6 C) 98.8 F (37.1 C)  TempSrc: Oral Oral    SpO2: 97%  100% 100%  Weight:      Height:       General exam: No acute distress HEENT: Hard of hearing Respiratory system: Clear to auscultate bilaterally Cardiovascular system: Regular rate and rhythm, S1-S2 Gastrointestinal system: Soft, nontender nondistended, hypoactive bowel sounds Central nervous system: no gross focal neurologic deficits, normal speech Extremities: Chronic venous stasis. Psychiatry: normal mood, flat affect   Data Reviewed: Sodium 130, potassium 3.2, creatinine 3.73   Upper GI series 12/18 -- study was severely limited by patient's poor hearing and inability to hear and follow instructions.  Esophagus structurally unremarkable but showed moderate dysmotility. Contrast did not move to the gastric fundus after 10 minutes (consistent with gastroparesis).  Note - Recent C diff antigen positive, PCR positive, toxin negative.   Pt not having diarrhea but has history of recurrent C diff.   GI recommended no treatment.   Family Communication: As per patient, will not call family  Disposition: Status is: Inpatient  Remains admitted due to -disposition determination now that she is decided to stop dialysis    Planned Discharge Destination: SNF recommended, although hospice facility would not be appropriate      Author: Annita Brod, MD 09/11/2022 3:28 PM  For on call review [email or web address].

## 2022-09-11 NOTE — Progress Notes (Signed)
[facility]  ROUNDING NOTE   Subjective:   [name] is a 61 y.o., female with past medical history of anemia, asthma, diabetes, CAD, PAD, GERD, and end stage renal disease on dialysis.  Patient arrived to emergency department via EMS with complaints of abdominal pain.  Patient has been admitted for Generalized abdominal pain [R10.84] Intractable nausea and vomiting [R11.2] Gastritis without bleeding, unspecified chronicity, unspecified gastritis type [K29.70]  Patient is known to our practice and receives outpatient dialysis treatments at [facility] on a TTS schedule, supervised by Dr. Holley Raring.    Patient seen resting quietly Refused scheduled dialysis due to recurrent diarrhea States nausea has slightly improved Tolerating small meals  Objective:  Vital signs in last 24 hours:  Temp:  [98 F (36.7 C)-99.6 F (37.6 C)] 99.6 F (37.6 C) (12/21 1106) Pulse Rate:  [65-100] 76 (12/21 1106) Resp:  [16] 16 (12/21 1106) BP: (112-133)/(45-51) 133/45 (12/21 1106) SpO2:  [97 %-100 %] 100 % (12/21 1106)  Weight change:  Filed Weights   09/08/22 0834 09/09/22 0751 09/09/22 1123  Weight: 81.5 kg 81.2 kg 80.2 kg    Intake/Output: I/O last 3 completed shifts: In: 3430.7 [I.V.:3430.7] Out: 0    Intake/Output this shift:  No intake/output data recorded.  Physical Exam: General: NAD  Head: Normocephalic, atraumatic.  Dry oral mucosal membranes  Eyes: Anicteric  Lungs:  Clear to auscultation. Normal effort  Heart: Regular rate and rhythm  Abdomen:  Soft, nontender  Extremities:  No peripheral edema.  Neurologic: Nonfocal, moving all four extremities  Skin: No lesions, dry  Access: Rt permcath    Basic Metabolic Panel: Recent Labs  Lab 09/07/22 0431 09/08/22 0549 09/08/22 1518 09/09/22 0306 09/10/22 1305 09/11/22 0951  NA 128* 125* 131* 129* 130* 130*  K 3.8 3.6  --  3.7 3.2* 3.2*  CL 94* 93*  --  97* 95* 95*  CO2 28 26  --  '27 28 27  '$ GLUCOSE 154*  76  --  72 113* 93  BUN 7* 9  --  7* 5* 7*  CREATININE 4.18* 4.66*  --  3.80* 3.06* 3.73*  CALCIUM 7.5* 7.9*  --  7.7* 8.2* 8.0*  MG 1.5* 1.8  --  1.6* 2.4  --   PHOS 2.4*  --   --  1.9* 2.9  --      Liver Function Tests: No results for input(s): "AST", "ALT", "ALKPHOS", "BILITOT", "PROT", "ALBUMIN" in the last 168 hours.   No results for input(s): "LIPASE", "AMYLASE" in the last 168 hours.   No results for input(s): "AMMONIA" in the last 168 hours.  CBC: Recent Labs  Lab 09/06/22 0947 09/08/22 0549 09/09/22 0809 09/11/22 0951  WBC 4.9 5.0 4.2 10.6*  HGB 8.7* 8.5* 7.8* 7.4*  HCT 27.3* 25.6* 24.2* 22.1*  MCV 80.8 77.8* 79.3* 78.1*  PLT 118* 152 113* 180     Cardiac Enzymes: No results for input(s): "CKTOTAL", "CKMB", "CKMBINDEX", "TROPONINI" in the last 168 hours.   BNP: Invalid input(s): "POCBNP"  CBG: Recent Labs  Lab 09/10/22 1710 09/10/22 2059 09/11/22 0821 09/11/22 1202 09/11/22 1359  GLUCAP 112* 96 87 76 133*      Microbiology: Results for orders placed or performed during the hospital encounter of 08/24/22  Gastrointestinal Panel by PCR , Stool     Status: None   Collection Time: 08/24/22  4:20 PM   Specimen: Stool  Result Value Ref Range Status   Campylobacter species NOT DETECTED NOT DETECTED Final  Plesimonas shigelloides NOT DETECTED NOT DETECTED Final   Salmonella species NOT DETECTED NOT DETECTED Final   Yersinia enterocolitica NOT DETECTED NOT DETECTED Final   Vibrio species NOT DETECTED NOT DETECTED Final   Vibrio cholerae NOT DETECTED NOT DETECTED Final   Enteroaggregative E coli (EAEC) NOT DETECTED NOT DETECTED Final   Enteropathogenic E coli (EPEC) NOT DETECTED NOT DETECTED Final   Enterotoxigenic E coli (ETEC) NOT DETECTED NOT DETECTED Final   Shiga like toxin producing E coli (STEC) NOT DETECTED NOT DETECTED Final   Shigella/Enteroinvasive E coli (EIEC) NOT DETECTED NOT DETECTED Final   Cryptosporidium NOT DETECTED NOT DETECTED  Final   Cyclospora cayetanensis NOT DETECTED NOT DETECTED Final   Entamoeba histolytica NOT DETECTED NOT DETECTED Final   Giardia lamblia NOT DETECTED NOT DETECTED Final   Adenovirus F40/41 NOT DETECTED NOT DETECTED Final   Astrovirus NOT DETECTED NOT DETECTED Final   Norovirus GI/GII NOT DETECTED NOT DETECTED Final   Rotavirus A NOT DETECTED NOT DETECTED Final   Sapovirus (I, II, IV, and V) NOT DETECTED NOT DETECTED Final    Comment: Performed at [facility], [address] [place], [place] [address]  C Difficile Quick Screen w PCR reflex     Status: Abnormal   Collection Time: 08/24/22  4:20 PM   Specimen: STOOL  Result Value Ref Range Status   C Diff antigen POSITIVE (A) NEGATIVE Final   C Diff toxin NEGATIVE NEGATIVE Final   C Diff interpretation Results are indeterminate. See PCR results.  Final    Comment: Performed at [facility], [address] [place], [place] [address]  C. Diff by PCR, Reflexed     Status: Abnormal   Collection Time: 08/24/22  4:20 PM  Result Value Ref Range Status   Toxigenic C. Difficile by PCR POSITIVE (A) NEGATIVE Final    Comment: Positive for toxigenic C. difficile with little to no toxin production. Only treat if clinical presentation suggests symptomatic illness. Performed at [facility], [address] [place], [place] [address]     Coagulation Studies: No results for input(s): "LABPROT", "INR" in the last 72 hours.   Urinalysis: No results for input(s): "COLORURINE", "LABSPEC", "PHURINE", "GLUCOSEU", "HGBUR", "BILIRUBINUR", "KETONESUR", "PROTEINUR", "UROBILINOGEN", "NITRITE", "LEUKOCYTESUR" in the last 72 hours.  Invalid input(s): "APPERANCEUR"    Imaging: No results found.   Medications:    dextrose 30 mL/hr at 09/11/22 0735     acidophilus  1 capsule Oral Daily   amLODipine  10 mg Oral Daily   Chlorhexidine Gluconate Cloth  6 each Topical Daily   cholecalciferol  1,000 Units Oral Daily    cyanocobalamin  1,000 mcg Oral Daily   feeding supplement (NEPRO CARB STEADY)  237 mL Oral BID BM   fluticasone  2 spray Each Nare Daily   Gerhardt's butt cream   Topical BID   heparin injection (subcutaneous)  5,000 Units Subcutaneous Q8H   hydrALAZINE  50 mg Oral BID   HYDROcodone-acetaminophen  1 tablet Oral Q8H   lipase/protease/amylase  72,000 Units Oral TID WC   metoCLOPramide (REGLAN) injection  10 mg Intravenous Q6H   mirtazapine  7.5 mg Oral QHS   multivitamin  1 tablet Oral Daily   pantoprazole (PROTONIX) IV  40 mg Intravenous Q12H   potassium chloride  20 mEq Oral Q2H   promethazine  12.5 mg Oral Q6H   acetaminophen **OR** acetaminophen, albuterol, alum & mag hydroxide-simeth, hydrALAZINE, hydrOXYzine, lipase/protease/amylase **AND** lipase/protease/amylase, mouth rinse, polyethylene glycol, polyvinyl alcohol, traZODone  Assessment/ Plan:  Ms. GENNELL HOW is a 61 y.o.  female with past medical history of anemia, asthma, diabetes, CAD, PAD, GERD, and end stage renal disease on dialysis.  Patient arrived to emergency department via EMS after being found at home by grandson with multiple space heaters deterrent to high setting and patient wrapped in heated blanket.  Patient has been admitted for Generalized abdominal pain [R10.84] Intractable nausea and vomiting [R11.2] Gastritis without bleeding, unspecified chronicity, unspecified gastritis type [K29.70]  [facility]/TTS/Rt Permcath  End-stage renal disease on hemodialysis.    Patient scheduled for dialysis today however refused due to multiple episodes of diarrhea.  Based on labs on presentation, patient appears stable.  Next treatment scheduled for Saturday.  2. Anemia of chronic kidney disease Lab Results  Component Value Date   HGB 7.4 (L) 09/11/2022    Patient receives [place] outpatient.  Hemoglobin remains decreased.  Will order Epogen 10,000 units with dialysis treatments.  3. Secondary Hyperparathyroidism:  with outpatient labs: PTH 485, phosphorus 3.9, calcium 8.5 on 06/03/22.   Lab Results  Component Value Date   PTH 357 (H) 10/05/2018   CALCIUM 8.0 (L) 09/11/2022   CAION 1.09 (L) 04/13/2020   PHOS 2.9 09/10/2022  Calcium decreased however stable.  Continue to monitor for now.  4. Diabetes mellitus type II with chronic kidney disease/renal manifestations:noninsulin dependent.   Glucose level stable on D10 infusion.  Will defer to primary team.  5.  Hypertension with chronic kidney disease.     Currently prescribed amlodipine, furosemide, and hydralazine.  Blood pressure stable for this patient.  6.  Hypokalemia likely due to GI losses.  Oral potassium chloride 20 mill equivalents ordered by primary team.     LOS: 6   12/21/20232:44 PM

## 2022-09-11 NOTE — TOC Progression Note (Signed)
Transition of Care [facility]) - Progression Note    Patient Details  Name: [name] MRN: [id number] Date of Birth: [birth year]  Transition of Care [facility]) CM/SW [place], RN Phone Number: 09/11/2022, 12:18 PM  Clinical Narrative:    Per discussion with Palliative she will transition to [place] care, She will not be appropriate for STR, she will stop dialysis        Expected Discharge Plan and Services                                               Social Determinants of Health (SDOH) Interventions SDOH Screenings   Food Insecurity: No Food Insecurity (09/04/2022)  Recent Concern: [place] Present (07/03/2022)  Housing: Low Risk  (09/04/2022)  Transportation Needs: No Transportation Needs (09/04/2022)  Utilities: Not At Risk (09/04/2022)  Financial Resource Strain: Low Risk  (02/12/2018)  Physical Activity: Insufficiently Active (02/12/2018)  Social Connections: Moderately Integrated (02/12/2018)  Stress: Stress Concern Present (02/12/2018)  Tobacco Use: Low Risk  (09/04/2022)    Readmission Risk Interventions    05/03/2021    3:02 PM 12/11/2020    9:23 AM 09/21/2020    9:39 AM  Readmission Risk Prevention Plan  Transportation Screening Complete Complete Complete  Medication Review Press photographer) Complete Complete Complete  PCP or Specialist appointment within 3-5 days of discharge  Complete Complete  HRI or Home Care Consult  Complete Complete  SW Recovery Care/Counseling Consult  Complete Complete  Palliative Care Screening Complete Not Applicable Complete  [place]  Not Complete Complete  SNF Comments  working on placement

## 2022-09-11 NOTE — Consult Note (Signed)
Responded to consult and phone call from palliative care. Was aware of urgency around need for living will completion. Spent time going over paperwork with patient and began filling out paperwork. Patient was almost complete and became very tired and asked this chaplain to please return later. I briefly encouraged the pt that we were almost done and she was adamant about not continuing. I will follow up later to finish living will document if patient is still willing and able.

## 2022-09-11 NOTE — Progress Notes (Addendum)
Daily Progress Note   Patient Name: [name]       Date: 09/11/2022 DOB: 1960-10-06  Age: 61 y.o. MRN#: [id number] Attending Physician: Annita Brod, MD Primary Care Physician: Denton Lank, MD Admit Date: 09/03/2022  Reason for Consultation/Follow-up: Establishing goals of care  Subjective: Follow-up notes and labs reviewed.  In to see patient.  I sat down at bedside and called her on her personal cell phone using an Ascom phone so that she could hear me.  She states that she was taken down for dialysis this morning and advised them that she was not completing dialysis and she no longer wanted to do this.  She states she is very tired and does not want to live any longer.  She states she talked to her son this morning.  She states she was extremely clear with him that she is tired of doing this and does not want to continue.  She states she has done dialysis for more years than her siblings.  She states she finally said to him "I do not understand what you do not understand about me not wanting to do this anymore, it's not your body".  She states her son seemed to hear what she was saying, but is concerned "it still was not registering to accept my wishes".    I inquired about her daughter [name] again today, and patient thought for a moment and stated "I really don't think she could do it."  Upon attempting to inquire further about this, she stated "I do not think she has the ability to do it".    I inquired about the friend that she mentioned yesterday during our meeting.  She states she spoke with her friend about it, and her friend advised her that she had functioned as H POA in the past, and could not deal with the dynamics and stress that comes with making decisions that are against  family members wishes.  Discussed completing a living will, and the ability to mark the form to either allow an H POA to make decisions that are different from what the form states, or marking the form so that the decisions on the form must be followed without interference.  She states she would like to complete the living will and have it marked so that  the decisions cannot be deviated from.  I inquired if she wanted me to call her son or other family member and she stated that she did not, she wanted to have the living will form completed.  Discussed that I would call the chaplain.  Order placed for spiritual care and I spoke with the chaplain regarding patient's wishes and urgent nature of completing the document as patient currently has decision-making capacity.  I spoke with TOC as well.  ADDENDUM: I went back to follow up, she was sleeping, but awakened to talk. She states her son came to visit since she and I had talked earlier. She states she explained to him again she did not want to do dialysis. She states he is not happy, and she is concerned that once she becomes confused, he may change plans against her wishes. She states she would like to complete a living will prior to any care team discussion with her son about care about plans.   Chaplain made aware-and she is trying to find a notary. Discussed with CNO while on unit.   Would reach out to hospice one this is completed.   I completed a MOST form today and the signed original was placed in the chart. The form was scanned and sent to medical records for it to be uploaded under ACP tab in Epic. A photocopy was also placed in the chart to be scanned into EMR. The patient outlined their wishes for the following treatment decisions:  Cardiopulmonary Resuscitation: Do Not Attempt Resuscitation (DNR/No CPR)  Medical Interventions: Comfort Measures: Keep clean, warm, and dry. Use medication by any route, positioning, wound care, and other  measures to relieve pain and suffering. Use oxygen, suction and manual treatment of airway obstruction as needed for comfort. Do not transfer to the hospital unless comfort needs cannot be met in current location.   No dialysis.   Antibiotics: Determine use of limitation of antibiotics when infection occurs  IV Fluids: No IV fluids (provide other measures to ensure comfort)  Feeding Tube: No feeding tube       Length of Stay: 6  Current Medications: Scheduled Meds:   acidophilus  1 capsule Oral Daily   amLODipine  10 mg Oral Daily   Chlorhexidine Gluconate Cloth  6 each Topical Daily   cholecalciferol  1,000 Units Oral Daily   cyanocobalamin  1,000 mcg Oral Daily   feeding supplement (NEPRO CARB STEADY)  237 mL Oral BID BM   fluticasone  2 spray Each Nare Daily   Gerhardt's butt cream   Topical BID   heparin injection (subcutaneous)  5,000 Units Subcutaneous Q8H   hydrALAZINE  50 mg Oral BID   HYDROcodone-acetaminophen  1 tablet Oral Q8H   lipase/protease/amylase  72,000 Units Oral TID WC   metoCLOPramide (REGLAN) injection  10 mg Intravenous Q6H   mirtazapine  7.5 mg Oral QHS   multivitamin  1 tablet Oral Daily   pantoprazole (PROTONIX) IV  40 mg Intravenous Q12H   promethazine  12.5 mg Oral Q6H    Continuous Infusions:  dextrose 30 mL/hr at 09/11/22 0735    PRN Meds: acetaminophen **OR** acetaminophen, albuterol, alum & mag hydroxide-simeth, hydrALAZINE, hydrOXYzine, lipase/protease/amylase **AND** lipase/protease/amylase, mouth rinse, polyethylene glycol, polyvinyl alcohol, traZODone  Physical Exam Pulmonary:     Effort: Pulmonary effort is normal.  Neurological:     Mental Status: She is alert.             Vital Signs: BP (!) 133/45 (BP  Location: Left Arm)   Pulse 76   Temp 99.6 F (37.6 C)   Resp 16   Ht _0  (1.651 m)   Wt 80.2 kg   SpO2 100%   BMI 29.42 kg/m  SpO2: SpO2: 100 % O2 Device: O2 Device: Room Air O2 Flow Rate: O2 Flow Rate (L/min): 3  L/min  Intake/output summary:  Intake/Output Summary (Last 24 hours) at 09/11/2022 1244 Last data filed at 09/11/2022 0344 Gross per 24 hour  Intake 598.4 ml  Output --  Net 598.4 ml   LBM: Last BM Date : 09/10/22 Baseline Weight: Weight: 79.2 kg Most recent weight: Weight: 80.2 kg    Patient Active Problem List   Diagnosis Date Noted   Chronic diarrhea 09/09/2022   Hypomagnesemia 09/07/2022   Recurrent Clostridioides difficile infection 09/04/2022   Exocrine pancreatic insufficiency 09/04/2022   Essential hypertension 09/03/2022   Dyslipidemia 09/03/2022   Peripheral neuropathy 09/03/2022   Type 1 diabetes mellitus with hypoglycemia without coma, without long-term current use of insulin ([place]) 09/03/2022   Debility 07/04/2022   Acute colitis 07/02/2022   Venous ulcer of ankle, right (HCC) 06/30/2022   Pericardial effusion 12/05/2021   Hearing impairment 12/04/2021   Hyponatremia 11/27/2021   COVID-19 virus infection 11/27/2021   Bacterial pneumonia 11/27/2021   Acute GI bleeding 11/15/2021   Upper GI bleed 11/15/2021   Anemia of chronic kidney failure 05/03/2021   Thrombocytopenia ([place]) 05/03/2021   Severe sepsis with septic shock ([place]) 04/25/2021   HCAP (healthcare-associated pneumonia) 37/34/2876   Acute metabolic encephalopathy 81/15/7262   UTI (urinary tract infection) 03/29/2021   Chronic respiratory failure with hypoxia ([place]) 12/18/2020   Superior vena caval syndrome 12/14/2020   Chronic diastolic CHF (congestive heart failure) ([place]) 12/04/2020   Bradycardia 09/17/2020   Advanced care planning/counseling discussion    Discitis of thoracic region    Fluid overload 08/10/2020   HLD (hyperlipidemia) 08/10/2020   Chest pain 08/10/2020   CAD (coronary artery disease) 08/10/2020   GERD (gastroesophageal reflux disease) 08/10/2020   Obesity (BMI 30-39.9) 06/06/2020   Anemia in ESRD (end-stage renal disease) ([place]) 05/31/2020   Type 1 diabetes mellitus ([place])  05/31/2020   End-stage renal disease ([place]) 05/31/2020   Hypertensive disorder 05/31/2020   Arm pain 05/21/2020   Cellulitis of right leg 05/02/2020   ESRD (end stage renal disease) ([place]) 04/13/2020   SOBOE (shortness of breath on exertion) 02/08/2020   Hematoma of arm, left, subsequent encounter 11/24/2019   Respiratory failure (HCC)    Shortness of breath    Complication of arteriovenous dialysis fistula 11/13/2019   PVD (peripheral vascular disease) ([place]) 11/13/2019   Severe anemia 11/13/2019   Pressure ulcer, stage 2 ([place]) 11/13/2019   Hemorrhagic shock ([place]) 11/11/2019   Leg pain 10/02/2019   CAP (community acquired pneumonia) 10/01/2019   Atherosclerosis of native arteries of extremity with intermittent claudication ([place]) 05/01/2019   Right-sided headache    COPD (chronic obstructive pulmonary disease) ([place]) 10/06/2018   Syncope 10/04/2018   Symptomatic anemia 06/18/2018   Gastroparesis due to DM ([place]) 03/55/9741   Complication of vascular access for dialysis 12/04/2017   Osteomyelitis ([place]) 09/30/2017   Carotid stenosis 06/18/2017   Bilateral carotid artery stenosis 06/16/2017   Benign essential HTN 05/19/2017   CKD (chronic kidney disease) stage 5, GFR less than 15 ml/min (HCC) 05/19/2017   Hyperlipidemia, mixed 05/19/2017   Shortness of breath 05/04/2017   Rash, skin 12/16/2016   Cellulitis of lower extremity 07/29/2016   Chronic venous  insufficiency 07/29/2016   Lymphedema 07/29/2016   TIA (transient ischemic attack) 04/21/2016   Altered mental status 04/08/2016   Hyperammonemia ([place]) 04/08/2016   Elevated troponin 04/08/2016   Depression 04/08/2016   Depression, major, recurrent, severe with psychosis ([place]) 04/08/2016   Blood in stool    Intractable cyclical vomiting with nausea    Reflux esophagitis    Gastritis    Generalized abdominal pain    Uncontrollable vomiting    Major depressive disorder, recurrent episode, moderate ([place]) 03/15/2016   Adjustment  disorder with mixed anxiety and depressed mood 03/15/2016   Malnutrition of moderate degree 12/01/2015   Renal mass    Dyspnea    Acute renal failure ([place])    Respiratory failure ([place])    Fever 11/14/2015   Encounter for central line placement    Encounter for orogastric (OG) tube placement    Nausea 11/12/2015   Hyperkalemia 10/03/2015   Diarrhea, unspecified 07/22/2015   Hypoglycemia 04/24/2015   Unresponsiveness 04/24/2015   Sinus bradycardia 04/24/2015   Hypothermia 04/24/2015   Acute hypoxemic respiratory failure ([place]) 04/24/2015   Acute on chronic diastolic CHF (congestive heart failure) ([place]) 04/05/2015   Diabetic gastroparesis ([place]) 04/05/2015   Hypokalemia 04/05/2015   Physical debility 04/05/2015   Acute pulmonary edema ([place]) 04/03/2015   Nausea and vomiting 04/03/2015   Hypoglycemia associated with diabetes ([place]) 04/03/2015   Anemia of chronic disease 04/03/2015   Secondary hyperparathyroidism ([place]) 04/03/2015   Pressure ulcer 04/02/2015   Acute respiratory failure with hypoxia ([place]) 04/01/2015   Adjustment disorder with anxiety 03/14/2015   Somatic symptom disorder, mild 03/08/2015   Coronary artery disease involving native coronary artery of native heart without angina pectoris    Nausea & vomiting 03/06/2015   Abdominal pain 03/06/2015   Hypoglycemia in type 2 diabetes mellitus , without long-term current use of insulin ([place]) 03/06/2015   HTN (hypertension) 03/06/2015   Gastroparesis 02/24/2015   Pleural effusion 02/19/2015   Bacteremia due to Enterococcus 02/19/2015   End-stage renal disease on hemodialysis ([place]) 02/19/2015   Diarrhea 08/01/2014    Palliative Care Assessment & Plan    Recommendations/Plan: Patient states she refused dialysis this morning, and spoke with her son this morning, and told him that she does not want to continue dialysis.  She states he seemed to hear her but is still concerned about his willingness to honor her wishes if she  becomes confused and cannot make decisions for herself. She states she does not feel her daughter would have the ability to be healthcare power of attorney, and her friend was not willing to serve as H POA because of the stress and dynamics of disagreements with the family members. Patient request to do a living will and have it marked so that the form cannot be deviated from.  I spoke with the chaplain, and chaplain will assist patient. After this is completed, would recommend reaching out to hospice.  I anticipate increasing symptom management needs with not completing dialysis.  I inquired about my calling patient's son, and patient does not want me to call him. In order to speak with patient, I sat down at her bedside, and called her personal cell phone from an [facility] hospital phone.  She places the phone on speaker phone and holds it up to her ear so that she is able to hear.  She is not able to converse otherwise.  Code Status:    Code Status Orders  (From admission, onward)  Start     Ordered   09/03/22 2022  Full code  Continuous       Question:  By:  Answer:  Consent: discussion documented in EHR   09/03/22 2033           Code Status History     Date Active Date Inactive Code Status Order ID Comments User Context   07/02/2022 0040 07/09/2022 0103 Full Code [id number]  Athena Masse, MD ED   04/28/2022 2140 05/05/2022 2307 Full Code [id number]  Para Skeans, MD ED   11/27/2021 0542 12/07/2021 0225 Full Code [id number]  Athena Masse, MD ED   11/15/2021 1739 11/17/2021 1743 Full Code [id number]  Sharen Hones, MD ED   04/30/2021 1444 05/10/2021 2330 DNR [id number]  Milus Banister, NP Inpatient   04/26/2021 1432 04/30/2021 1444 Full Code [id number]  Ottie Glazier, MD ED   04/25/2021 2123 04/26/2021 1432 Full Code [id number]  Howerter, Ethelda Chick, DO ED   03/29/2021 0244 04/02/2021 2300 Full Code [id number]  Athena Masse, MD ED   12/04/2020 2042 12/17/2020 2147 DNR [id number]  Ileene Musa T,  DO ED   12/04/2020 1939 12/04/2020 2042 Full Code [id number]  Orene Desanctis, DO ED   09/17/2020 1431 09/21/2020 1925 Full Code [id number]  Tyna Jaksch, MD ED   08/10/2020 1323 08/17/2020 2223 Full Code [id number]  Ivor Costa, MD ED   06/02/2020 2251 06/25/2020 2316 Full Code [id number]  Elwyn Reach, MD ED   05/22/2020 1201 05/23/2020 2050 Full Code [id number]  Vashti Hey, MD ED   05/02/2020 2057 05/06/2020 1929 Full Code [id number]  Mansy, Arvella Merles, MD ED   04/13/2020 1458 04/14/2020 2239 Full Code [id number]  Sela Hua, PA-C Inpatient   11/24/2019 2038 11/30/2019 1824 Full Code [id number]  Artist Beach, MD Inpatient   11/11/2019 0715 11/19/2019 0006 Full Code [id number]  Awilda Bill, NP ED   09/30/2019 2126 10/01/2019 2257 Full Code [id number]  Nolberto Hanlon, MD ED   10/04/2018 1847 10/12/2018 0027 Full Code [id number]  Dustin Flock, MD Inpatient   06/18/2018 0449 06/20/2018 1642 Full Code [id number]  Arta Silence, MD ED   02/12/2018 1312 02/16/2018 0050 Full Code [id number]  Epifanio Lesches, MD ED   01/17/2018 1848 01/21/2018 1341 Full Code [id number]  Vaughan Basta, MD Inpatient   09/30/2017 1804 10/12/2017 2238 Full Code [id number]  Vaughan Basta, MD Inpatient   09/03/2017 2243 09/05/2017 1901 Full Code [id number]  Lance Coon, MD Inpatient   05/04/2017 1833 05/05/2017 2330 Full Code [id number]  Henreitta Leber, MD Inpatient   03/23/2017 1551 03/25/2017 1739 Full Code [id number]  Gladstone Lighter, MD Inpatient   04/21/2016 1559 04/21/2016 1717 Full Code [id number]  Nicholes Mango, MD Inpatient   04/08/2016 1415 04/11/2016 1947 Full Code [id number]  Theodoro Grist, MD Inpatient   03/15/2016 1322 03/20/2016 0019 Full Code [id number]  Bettey Costa, MD Inpatient   02/16/2016 1409 02/19/2016 2238 Full Code [id number]  Bettey Costa, MD ED   12/17/2015 1139 12/21/2015 2209 Full Code [id number]  Dustin Flock, MD ED   11/12/2015 1602 12/01/2015 1943 Full Code [id number]  Bettey Costa, MD  Inpatient   10/07/2015 0937 10/12/2015 1959 Full Code [id number]  Fritzi Mandes, MD ED   10/03/2015 0355 10/05/2015 2230 Full Code [id number]  Lance Coon, MD Inpatient   08/31/2015 1600 09/03/2015 2209 Full Code [id number]  Demetrios Loll, MD ED   07/26/2015 1528 07/26/2015 2136 Full Code [id number]  Dionisio David, MD Inpatient  07/22/2015 1736 07/26/2015 1528 Full Code [id number]  Vaughan Basta, MD Inpatient   06/05/2015 2039 06/07/2015 2003 Full Code [id number]  Bettey Costa, MD Inpatient   05/21/2015 2142 05/24/2015 1626 Full Code [id number]  Henreitta Leber, MD Inpatient   04/24/2015 0855 04/26/2015 2211 Full Code [id number]  Demetrios Loll, MD Inpatient   04/01/2015 1558 04/05/2015 2007 Full Code [id number]  Epifanio Lesches, MD ED   03/06/2015 0805 03/09/2015 2111 Full Code [id number]  Juluis Mire, MD Inpatient   02/19/2015 1614 02/24/2015 1741 Full Code [id number]  Aldean Jewett, MD Inpatient       Prognosis:  < 2 weeks   Care plan was discussed with spiritual care and case manager.  Thank you for allowing the Palliative Medicine Team to assist in the care of this patient.   Asencion Gowda, NP  Please contact Palliative Medicine Team phone at [phone] for questions and concerns.

## 2022-09-11 NOTE — Care Management Important Message (Signed)
Important Message  Patient Details  Name: NAESHA BUCKALEW MRN: [id number] Date of Birth: December 21, 1960   Medicare Important Message Given:  Other (see comment)  Patient transferred to comfort care and will be followed by Hospice. Out of respect for the patient and family no Important Message from [facility] given.   [name] A Harout Scheurich 09/11/2022, 2:53 PM

## 2022-09-11 NOTE — Progress Notes (Signed)
Pt c/o being cold multiple times to nursing staff. Pt is currently covered with multiple blankets and room temp is maxed out at 80 degrees. Notified NP Randol Kern for heating pad. See new orders. Oral temp 98.9 on reassessment. Pt also given PRN anxiety med per request.  Earleen Reaper, RN

## 2022-09-11 NOTE — Consult Note (Signed)
Per Patient and Palliative Dr request. Completed Living will for pt. After medical team deemed her competent to make decisions fr herself. We went over in length the paperwork. She acknowledged she understood and agreed with the living will document. She wanted to be moved to DNR as well as have her living will honored in the case of her no longer being able to make decisions on her own . Document notarized and scanned into her chart.

## 2022-09-11 NOTE — Progress Notes (Signed)
Patient presents for hemodialysis treatment, but declines treatment stating that she was not having treatment today. Nephrology NP notified, patient returned to room.

## 2022-09-11 NOTE — Progress Notes (Signed)
This nurse and NT attempted to get Pt OOB after she returned from scheduled dialysis. Pt refused to be transferred [place].Pt transporter reported Pt refused dialysis.Dr Candiss Norse and NP Benancio Deeds came to the room to talk to the patient and inform this nurse that Pt will do dialysis on Saturday per schedule.Pt c/o of being cold,room thermostat at 80 degrees and son also brought heated blanket for patient and this nurse connected it to the socket for patient.Pt also has a heating pad/T pump provided for warmth at this time with Q2hrs skin assessment.Nursing will attempt to transfer Pt OOB before lunch.no verbal c/o or any ssx of distress at this time.will cont to monitor.

## 2022-09-12 DIAGNOSIS — R112 Nausea with vomiting, unspecified: Secondary | ICD-10-CM | POA: Diagnosis not present

## 2022-09-12 DIAGNOSIS — E871 Hypo-osmolality and hyponatremia: Secondary | ICD-10-CM | POA: Diagnosis not present

## 2022-09-12 DIAGNOSIS — E1143 Type 2 diabetes mellitus with diabetic autonomic (poly)neuropathy: Secondary | ICD-10-CM | POA: Diagnosis not present

## 2022-09-12 LAB — GLUCOSE, CAPILLARY
Glucose-Capillary: 83 mg/dL (ref 70–99)
Glucose-Capillary: 91 mg/dL (ref 70–99)
Glucose-Capillary: 68 mg/dL — ABNORMAL LOW (ref 70–99)
Glucose-Capillary: 93 mg/dL (ref 70–99)

## 2022-09-12 MED ORDER — MORPHINE SULFATE (PF) 2 MG/ML IV SOLN
2.0000 mg | INTRAVENOUS | Status: DC | PRN
  Administered 2022-09-12: 2 mg via INTRAVENOUS
  Filled 2022-09-12: qty 1

## 2022-09-12 NOTE — Progress Notes (Signed)
Physical Therapy Treatment Patient Details Name: [name] MRN: [id number] DOB: 02/06/1961 Today's Date: 09/12/2022   History of Present Illness Pt is a 61 y/o F admitted on 09/03/22 after presenting with c/o N&V. Pt is being treated for intractable N&V, likely acute gastritis that could be viral. PMH: ESRD on HD MWF, asthma, OA, chronic diastolic CHF, COPD, CAD, DM2, gastroparesis, GERD, diverticulosis, peripheral neuropathy    PT Comments    Increased time spent with pt due to multiple medical complexities. Pt received in recliner with c/o buttocks hurting and requesting to return to bed. Nursing called to stop IV which was leaking from access site. Pt stood with ModA to RW at which point strain of standing dislodged rectal tube. Pt assisted with hygiene, also limited due to significant skin soreness/irritation causing pt to cry out. Pt able to advance with RW slowly to bed and was assisted to supine with NT and positioned to comfort in bed for IV Nurse to place new access line. All needs met. Spoke with son on room phone regarding current POC and pt's transition to [facility]. Will discuss with team regarding continued skilled PT services.   Recommendations for follow up therapy are one component of a multi-disciplinary discharge planning process, led by the attending physician.  Recommendations may be updated based on patient status, additional functional criteria and insurance authorization.  Follow Up Recommendations  Skilled nursing-short term rehab (<3 hours/day) Can patient physically be transported by private vehicle: No   Assistance Recommended at Discharge Frequent or constant Supervision/Assistance  Patient can return home with the following A lot of help with walking and/or transfers;A lot of help with bathing/dressing/bathroom;Assist for transportation;Assistance with cooking/housework;Help with stairs or ramp for entrance;Direct supervision/assist for medications management    Equipment Recommendations  None recommended by PT    Recommendations for Other Services       Precautions / Restrictions Precautions Precautions: Fall Restrictions Weight Bearing Restrictions: No     Mobility  Bed Mobility Overal bed mobility: Needs Assistance Bed Mobility: Sit to Supine       Sit to supine: Max assist, +2 for physical assistance   General bed mobility comments: MaxA x 2 to return to prevent increased pain    Transfers Overall transfer level: Needs assistance Equipment used: Rolling walker (2 wheels) Transfers: Sit to/from Stand Sit to Stand: Mod assist           General transfer comment: Pt retaining fluid requiring increased assist to stand from chair    Ambulation/Gait Ambulation/Gait assistance: Min assist Gait Distance (Feet): 3 Feet Assistive device: Rolling walker (2 wheels) Gait Pattern/deviations: Step-to pattern, Shuffle, Wide base of support       General Gait Details: While standing pt strained and pushed out her rectal tube   Stairs             Wheelchair Mobility    Modified Rankin (Stroke Patients Only)       Balance Overall balance assessment: Needs assistance Sitting-balance support: Feet supported Sitting balance-Leahy Scale: Good     Standing balance support: Bilateral upper extremity supported, Reliant on assistive device for balance, During functional activity Standing balance-Leahy Scale: Fair                              Cognition Arousal/Alertness: Awake/alert Behavior During Therapy: WFL for tasks assessed/performed Overall Cognitive Status: Within Functional Limits for tasks assessed  General Comments:  (Pt able to answer questions appropriately)        Exercises      General Comments General comments (skin integrity, edema, etc.): Left upper arm IV leaking from sitem, rectal tube dislodged in standingm, pt unable to tolerate  peri-care due to significant skin soreness      Pertinent Vitals/Pain Pain Assessment Pain Assessment: 0-10 Pain Score: 7  Pain Location: buttocks, peri area Pain Descriptors / Indicators: Aching, Discomfort, Grimacing, Guarding, Crying Pain Intervention(s): Limited activity within patient's tolerance, Repositioned, Patient requesting pain meds-RN notified, RN gave pain meds during session    Home Living                          Prior Function            PT Goals (current goals can now be found in the care plan section) Acute Rehab PT Goals Patient Stated Goal: none stated    Frequency    Min 2X/week      PT Plan Current plan remains appropriate    Co-evaluation              AM-PAC PT "6 Clicks" Mobility   Outcome Measure  Help needed turning from your back to your side while in a flat bed without using bedrails?: A Lot Help needed moving from lying on your back to sitting on the side of a flat bed without using bedrails?: A Lot Help needed moving to and from a bed to a chair (including a wheelchair)?: A Little Help needed standing up from a chair using your arms (e.g., wheelchair or bedside chair)?: A Little Help needed to walk in hospital room?: A Lot Help needed climbing 3-5 steps with a railing? : Total 6 Click Score: 13    End of Session Equipment Utilized During Treatment: Oxygen Activity Tolerance: Patient limited by pain Patient left: in bed;with call bell/phone within reach;with bed alarm set;with nursing/sitter in room Nurse Communication: Mobility status;Patient requests pain meds PT Visit Diagnosis: Muscle weakness (generalized) (M62.81);Difficulty in walking, not elsewhere classified (R26.2)     Time: 1200-1302 PT Time Calculation (min) (ACUTE ONLY): 62 min  Charges:  $Gait Training: 8-22 mins $Therapeutic Activity: 38-52 mins                    Mikel Cella, PTA    Josie Dixon 09/12/2022, 2:56 PM

## 2022-09-12 NOTE — Progress Notes (Signed)
Patient sitting on bedside recliner at this time.This nurse attempted to administer 10:00am Meds to Pt.patient refused all meds and nephro supplement as well.Pt did not give any reason why she's refusing her meds.Pt did receive her phenegan and lipase medications at 8:00am respectively.patient education provided at this time for for Pt on indication for medications.MD and son informed per protocol.This nurse inform Pt if she changes her mind,meds will be provided.no verbal c/o or any ssx of distress.will cont to monitor.CL is within reach.

## 2022-09-12 NOTE — Progress Notes (Signed)
[facility]  ROUNDING NOTE   Subjective:   [name] is a 61 y.o., female with past medical history of anemia, asthma, diabetes, CAD, PAD, GERD, and end stage renal disease on dialysis.  Patient arrived to emergency department via EMS with complaints of abdominal pain.  Patient has been admitted for Generalized abdominal pain [R10.84] Intractable nausea and vomiting [R11.2] Gastritis without bleeding, unspecified chronicity, unspecified gastritis type [K29.70]  Patient is known to our practice and receives outpatient dialysis treatments at [facility] on a TTS schedule, supervised by Dr. Holley Raring.    Patient seen sitting in chair Voices how uncomfortable she is sitting in chair States she is tired and would like to stop dialysis Per chart review, patient met with chaplain and completed a living will and changed code status.   Objective:  Vital signs in last 24 hours:  Temp:  [98 F (36.7 C)-98.8 F (37.1 C)] 98 F (36.7 C) (12/22 0900) Pulse Rate:  [61-74] 68 (12/22 0900) Resp:  [16-17] 17 (12/22 0900) BP: (112-126)/(50-53) 112/50 (12/22 0900) SpO2:  [100 %] 100 % (12/22 0900)  Weight change:  Filed Weights   09/08/22 0834 09/09/22 0751 09/09/22 1123  Weight: 81.5 kg 81.2 kg 80.2 kg    Intake/Output: I/O last 3 completed shifts: In: 313.2 [I.V.:313.2] Out: -    Intake/Output this shift:  No intake/output data recorded.  Physical Exam: General: NAD  Head: Normocephalic, atraumatic.  Dry oral mucosal membranes  Eyes: Anicteric  Lungs:  Clear to auscultation. Normal effort  Heart: Regular rate and rhythm  Abdomen:  Soft, nontender  Extremities:  No peripheral edema.  Neurologic: Nonfocal, moving all four extremities  Skin: No lesions, dry  Access: Rt permcath    Basic Metabolic Panel: Recent Labs  Lab 09/07/22 0431 09/08/22 0549 09/08/22 1518 09/09/22 0306 09/10/22 1305 09/11/22 0951  NA 128* 125* 131* 129* 130* 130*  K 3.8 3.6  --  3.7  3.2* 3.2*  CL 94* 93*  --  97* 95* 95*  CO2 28 26  --  _0 GLUCOSE 154* 76  --  72 113* 93  BUN 7* 9  --  7* 5* 7*  CREATININE 4.18* 4.66*  --  3.80* 3.06* 3.73*  CALCIUM 7.5* 7.9*  --  7.7* 8.2* 8.0*  MG 1.5* 1.8  --  1.6* 2.4  --   PHOS 2.4*  --   --  1.9* 2.9  --      Liver Function Tests: No results for input(s): "AST", "ALT", "ALKPHOS", "BILITOT", "PROT", "ALBUMIN" in the last 168 hours.   No results for input(s): "LIPASE", "AMYLASE" in the last 168 hours.   No results for input(s): "AMMONIA" in the last 168 hours.  CBC: Recent Labs  Lab 09/06/22 0947 09/08/22 0549 09/09/22 0809 09/11/22 0951  WBC 4.9 5.0 4.2 10.6*  HGB 8.7* 8.5* 7.8* 7.4*  HCT 27.3* 25.6* 24.2* 22.1*  MCV 80.8 77.8* 79.3* 78.1*  PLT 118* 152 113* 180     Cardiac Enzymes: No results for input(s): "CKTOTAL", "CKMB", "CKMBINDEX", "TROPONINI" in the last 168 hours.   BNP: Invalid input(s): "POCBNP"  CBG: Recent Labs  Lab 09/11/22 1359 09/11/22 1756 09/11/22 2052 09/12/22 0838 09/12/22 1259  GLUCAP 133* 114* 94 83 91      Microbiology: Results for orders placed or performed during the hospital encounter of 08/24/22  Gastrointestinal Panel by PCR , Stool     Status: None   Collection Time: 08/24/22  4:20  PM   Specimen: Stool  Result Value Ref Range Status   Campylobacter species NOT DETECTED NOT DETECTED Final   Plesimonas shigelloides NOT DETECTED NOT DETECTED Final   Salmonella species NOT DETECTED NOT DETECTED Final   Yersinia enterocolitica NOT DETECTED NOT DETECTED Final   Vibrio species NOT DETECTED NOT DETECTED Final   Vibrio cholerae NOT DETECTED NOT DETECTED Final   Enteroaggregative E coli (EAEC) NOT DETECTED NOT DETECTED Final   Enteropathogenic E coli (EPEC) NOT DETECTED NOT DETECTED Final   Enterotoxigenic E coli (ETEC) NOT DETECTED NOT DETECTED Final   Shiga like toxin producing E coli (STEC) NOT DETECTED NOT DETECTED Final   Shigella/Enteroinvasive E coli  (EIEC) NOT DETECTED NOT DETECTED Final   Cryptosporidium NOT DETECTED NOT DETECTED Final   Cyclospora cayetanensis NOT DETECTED NOT DETECTED Final   Entamoeba histolytica NOT DETECTED NOT DETECTED Final   Giardia lamblia NOT DETECTED NOT DETECTED Final   Adenovirus F40/41 NOT DETECTED NOT DETECTED Final   Astrovirus NOT DETECTED NOT DETECTED Final   Norovirus GI/GII NOT DETECTED NOT DETECTED Final   Rotavirus A NOT DETECTED NOT DETECTED Final   Sapovirus (I, II, IV, and V) NOT DETECTED NOT DETECTED Final    Comment: Performed at [facility], [address] [place], [place] [address]  C Difficile Quick Screen w PCR reflex     Status: Abnormal   Collection Time: 08/24/22  4:20 PM   Specimen: STOOL  Result Value Ref Range Status   C Diff antigen POSITIVE (A) NEGATIVE Final   C Diff toxin NEGATIVE NEGATIVE Final   C Diff interpretation Results are indeterminate. See PCR results.  Final    Comment: Performed at [facility], [address] [place], [place] [address]  C. Diff by PCR, Reflexed     Status: Abnormal   Collection Time: 08/24/22  4:20 PM  Result Value Ref Range Status   Toxigenic C. Difficile by PCR POSITIVE (A) NEGATIVE Final    Comment: Positive for toxigenic C. difficile with little to no toxin production. Only treat if clinical presentation suggests symptomatic illness. Performed at [facility], [address] [place], [place] [address]     Coagulation Studies: No results for input(s): "LABPROT", "INR" in the last 72 hours.   Urinalysis: No results for input(s): "COLORURINE", "LABSPEC", "PHURINE", "GLUCOSEU", "HGBUR", "BILIRUBINUR", "KETONESUR", "PROTEINUR", "UROBILINOGEN", "NITRITE", "LEUKOCYTESUR" in the last 72 hours.  Invalid input(s): "APPERANCEUR"    Imaging: No results found.   Medications:    dextrose 30 mL/hr at 09/11/22 0735     acidophilus  1 capsule Oral Daily   amLODipine  10 mg Oral Daily   Chlorhexidine  Gluconate Cloth  6 each Topical Daily   cholecalciferol  1,000 Units Oral Daily   cyanocobalamin  1,000 mcg Oral Daily   [START ON 09/13/2022] epoetin (EPOGEN/PROCRIT) injection  10,000 Units Intravenous Q T,Th,Sa-HD   feeding supplement (NEPRO CARB STEADY)  237 mL Oral BID BM   fluticasone  2 spray Each Nare Daily   Gerhardt's butt cream   Topical BID   heparin injection (subcutaneous)  5,000 Units Subcutaneous Q8H   hydrALAZINE  50 mg Oral BID   HYDROcodone-acetaminophen  1 tablet Oral Q8H   lipase/protease/amylase  72,000 Units Oral TID WC   metoCLOPramide (REGLAN) injection  10 mg Intravenous Q6H   mirtazapine  7.5 mg Oral QHS   multivitamin  1 tablet Oral Daily   pantoprazole (PROTONIX) IV  40 mg Intravenous Q12H   promethazine  12.5 mg Oral Q6H   acetaminophen **OR** acetaminophen, albuterol, alum & mag hydroxide-simeth, hydrALAZINE, hydrOXYzine, lipase/protease/amylase **AND** lipase/protease/amylase, mouth rinse, polyethylene glycol, polyvinyl alcohol, traZODone  Assessment/ Plan:  [name] is a 61 y.o.  female with past medical history of anemia, asthma, diabetes, CAD, PAD, GERD, and end stage renal disease on dialysis.  Patient arrived to emergency department via EMS after being found at home by grandson with multiple space heaters deterrent to high setting and patient wrapped in heated blanket.  Patient has been admitted for Generalized abdominal pain [R10.84] Intractable nausea and vomiting [R11.2] Gastritis without bleeding, unspecified chronicity, unspecified gastritis type [K29.70]  [facility] [place]/TTS/Rt Permcath  End-stage renal disease on hemodialysis.    Patient states she wants to stop dialysis. She voices understanding of what will happen if dialysis is not continued.  She wants to discuss this with her son later today. Will not offer dialysis any further at this time.   2. Anemia of chronic kidney disease Lab Results  Component Value Date   HGB 7.4 (L)  09/11/2022    Patient receives [place] outpatient.  Hemoglobin not at goal.   3. Secondary Hyperparathyroidism: with outpatient labs: PTH 485, phosphorus 3.9, calcium 8.5 on 06/03/22.   Lab Results  Component Value Date   PTH 357 (H) 10/05/2018   CALCIUM 8.0 (L) 09/11/2022   CAION 1.09 (L) 04/13/2020   PHOS 2.9 09/10/2022  Monitoring bone minerals during this admission. Continue to monitor for now.  4. Diabetes mellitus type II with chronic kidney disease/renal manifestations:noninsulin dependent.   Glucose level stable on D10 infusion.  Will defer to primary team.  5.  Hypertension with chronic kidney disease.     Currently prescribed amlodipine, furosemide, and hydralazine.  Blood pressure 112/50.  6.  Hypokalemia likely due to GI losses.  Oral supplementation ordered by primary team yesterday.      LOS: 7   12/22/20231:17 PM

## 2022-09-12 NOTE — Progress Notes (Signed)
Progress Note   Patient: [name] ATF:[id number] DOB: 07/21/1961 DOA: 09/03/2022     7 DOS: the patient was seen and examined on 09/12/2022   Brief hospital course: [name] is a 61 y.o. female with medical history significant for end-stage renal disease on hemodialysis on MWF, asthma, osteoarthritis, chronic diastolic CHF, COPD, coronary artery disease, type 2 diabetes mellitus, gastroparesis, GERD, diverticulosis and peripheral neuropathy, who presented to the ER 09/03/2022 for evaluation of intractable nausea and vomiting since evening before.  No bilious vomitus or hematemesis.  She admits to epigastric abdominal pain and denies any diarrhea.  Gastroenterology consulted for persistent N/V and several chronic GI issues with planned outpatient endoscopies scheduled.  Patient appears to have improvement with scheduled Phenergan and Reglan.  Palliative care has been following patient and after extensive discussions, patient states that she is tired of having dialysis.  She has mentioned this before to family and even to the staff at the dialysis center, but they seem to dismiss her wishes.  She states that she has concerns about her children keeping her going should she stop treatments.  After extensive discussion patient has opted for living will which prevents deviating from her wishes so that she can stop her dialysis.  Patient transitioned to comfort care with plans to transition to hospice facility.  Assessment and Plan: * Intractable nausea and vomiting-resolved as of 09/10/2022 Uncontrolled gastroparesis.  Taken off Reglan outpatient for unclear reasons.  Much improved with Phenergan and Reglan.  Gastroparesis due to DM [facility]) Documented allergy to Reglan. Not on motility agent. PO Phenergan scheduled (as of 12/16 to monitor improvement in N/V, dry heaves). GI is following. Trial Reglan while here (documented allergy vs intolerance but remote & pt does not recall details)  Nausea and  vomiting doing much better with Reglan and scheduled Phenergan.  Type 1 diabetes mellitus with hypoglycemia without coma, without long-term current use of insulin (HCC) Hypoglycemia due to N/V and lack of PO intake. 12/19: sugars only 70-90's on D10 drip.  Have stopped fluids altogether now that she is comfort care.  Hyponatremia Felt to be secondary to progressive volume status and not tolerating dialysis prior to admission.  Currently at 130.  After being made comfort care, labs no longer checked.  Hypomagnesemia Replacing IV Mg-sulfate for Mg 1.5 (12/17).  No further labs now that she is comfort care  Essential hypertension Initially placed on home medications.  Medications discontinued now that she is comfort care  End-stage renal disease on hemodialysis [facility]) Nephrology consulted for dialysis.  After discussion with plan of care patient is [place] that she wishes to stop dialysis and has taken steps to ensure that her wishes are followed including a living will completed 12/21 as patient had concerns that once uremia started, her wishes may be reversed by her healthcare power of attorney.  She felt this this will would ensure her wishes are continued.  Now fully made comfort care.  Dyslipidemia Continue statin  Peripheral neuropathy Continue Neurontin.  Chronic diarrhea Pt has hx of recurrent C diff. Follows with GI for multiple chronic GI issues.   Last C diff screen positive, but pt was not having diarrhea or signs of infection, GI recommended no treatment.  Son reports sudden onset diarrhea right after PO intake at home.   This could possibly be from IBS.  Attempted trial of rectal tube, but patient could not tolerate.  Exocrine pancreatic insufficiency Follows with GI.  Initially on Creon, discontinued after comfort  care  Recurrent Clostridioides difficile infection Not likely active infection.  She is followed by GI for recurrent C diff and had repeat stool studies on  12/3 which were antigen positive, toxin negative, but PCR positive. Pt has intractable N/V but has denied recent diarrhea.  No treatment indicated at this time.          Subjective: Cannot hear me very well.  Some pain earlier   Physical Exam: Vitals:   09/11/22 1449 09/11/22 2344 09/12/22 0900 09/12/22 1737  BP: (!) 126/53 (!) 112/53 (!) 112/50 123/62  Pulse: 74 61 68 71  Resp: '16 16 17 16  '$ Temp: 98.8 F (37.1 C) 98.1 F (36.7 C) 98 F (36.7 C) 98.8 F (37.1 C)  TempSrc:   Oral Oral  SpO2: 100% 100% 100% 100%  Weight:      Height:       General exam: No acute distress, currently calm HEENT: Hard of hearing Respiratory system: Clear to auscultate bilaterally Cardiovascular system: Regular rate and rhythm, S1-S2 Gastrointestinal system: Soft, nontender nondistended, hypoactive bowel sounds    Data Reviewed: No further labs now that she is comfort care   Family Communication: Updated son by phone  Disposition: Status is: Inpatient  Remains admitted due to hospice facility availability    Planned Discharge Destination: Looking at hospice facility availability      Author: Annita Brod, MD 09/12/2022 5:50 PM  For on call review [email or web address].

## 2022-09-13 DIAGNOSIS — E871 Hypo-osmolality and hyponatremia: Secondary | ICD-10-CM | POA: Diagnosis not present

## 2022-09-13 DIAGNOSIS — R112 Nausea with vomiting, unspecified: Secondary | ICD-10-CM | POA: Diagnosis not present

## 2022-09-13 DIAGNOSIS — E1143 Type 2 diabetes mellitus with diabetic autonomic (poly)neuropathy: Secondary | ICD-10-CM | POA: Diagnosis not present

## 2022-09-13 LAB — GLUCOSE, CAPILLARY
Glucose-Capillary: 53 mg/dL — ABNORMAL LOW (ref 70–99)
Glucose-Capillary: 66 mg/dL — ABNORMAL LOW (ref 70–99)
Glucose-Capillary: 85 mg/dL (ref 70–99)
Glucose-Capillary: 74 mg/dL (ref 70–99)
Glucose-Capillary: 92 mg/dL (ref 70–99)
Glucose-Capillary: 71 mg/dL (ref 70–99)

## 2022-09-13 MED ORDER — SODIUM CHLORIDE 0.9 % IV SOLN
25.0000 mg | Freq: Once | INTRAVENOUS | Status: AC
  Administered 2022-09-13: 25 mg via INTRAVENOUS
  Filled 2022-09-13: qty 1

## 2022-09-13 MED ORDER — GLUCOSE 4 G PO CHEW
1.0000 | CHEWABLE_TABLET | Freq: Once | ORAL | Status: AC
  Administered 2022-09-14: 4 g via ORAL

## 2022-09-13 MED ORDER — MORPHINE SULFATE (PF) 2 MG/ML IV SOLN: 2.0000 mg | INTRAVENOUS | Status: DC | PRN

## 2022-09-13 MED ORDER — GLUCOSE 4 G PO CHEW
1.0000 | CHEWABLE_TABLET | Freq: Two times a day (BID) | ORAL | Status: DC
  Administered 2022-09-13 – 2022-09-15 (×4): 4 g via ORAL
  Filled 2022-09-13 (×4): qty 1

## 2022-09-13 MED ORDER — TRAZODONE HCL 50 MG PO TABS
25.0000 mg | ORAL_TABLET | Freq: Every day | ORAL | Status: DC
  Administered 2022-09-14: 25 mg via ORAL
  Filled 2022-09-13: qty 1

## 2022-09-13 MED ORDER — MORPHINE 100MG IN NS 100ML (1MG/ML) PREMIX INFUSION
1.0000 mg/h | INTRAVENOUS | Status: DC
  Administered 2022-09-13: 1 mg/h via INTRAVENOUS
  Filled 2022-09-13: qty 100

## 2022-09-13 NOTE — Progress Notes (Addendum)
1535 Hemodialysis cath to right IJ pulled at this time.Cath tip intact. Sutures removed Pt lying flat, pressure applied for five mins. No active bleeding VSS. Gauze and tegaderm applied to right chest.

## 2022-09-13 NOTE — Progress Notes (Signed)
Progress Note   Patient: [name] YQM:[id number] DOB: 02/06/1961 DOA: 09/03/2022     8 DOS: the patient was seen and examined on 09/13/2022   Brief hospital course: [name] is a 61 y.o. female with medical history significant for end-stage renal disease on hemodialysis on MWF, asthma, osteoarthritis, chronic diastolic CHF, COPD, coronary artery disease, type 2 diabetes mellitus, gastroparesis, GERD, diverticulosis and peripheral neuropathy, who presented to the ER 09/03/2022 for evaluation of intractable nausea and vomiting since evening before.  No bilious vomitus or hematemesis.  She admits to epigastric abdominal pain and denies any diarrhea.  Gastroenterology consulted for persistent N/V and several chronic GI issues with planned outpatient endoscopies scheduled.  Patient appears to have improvement with scheduled Phenergan and Reglan.  Palliative care has been following patient and after extensive discussions, patient states that she is tired of having dialysis.  She has mentioned this before to family and even to the staff at the dialysis center, but they seem to dismiss her wishes.  She states that she has concerns about her children keeping her going should she stop treatments.  After extensive discussion patient has opted for living will which prevents deviating from her wishes so that she can stop her dialysis.  Patient transitioned to comfort care with plans to transition to hospice facility.  Assessment and Plan: * Intractable nausea and vomiting-resolved as of 09/10/2022 Uncontrolled gastroparesis.  Taken off Reglan outpatient for unclear reasons.  Much improved with Phenergan and Reglan.  Gastroparesis due to DM [facility]) Documented allergy to Reglan. Not on motility agent. PO Phenergan scheduled (as of 12/16 to monitor improvement in N/V, dry heaves). GI is following. Trial Reglan while here (documented allergy vs intolerance but remote & pt does not recall details)  Nausea and  vomiting doing much better with Reglan and scheduled Phenergan.  Type 1 diabetes mellitus with hypoglycemia without coma, without long-term current use of insulin (HCC) Hypoglycemia due to N/V and lack of PO intake. 12/19: sugars only 70-90's on D10 drip.  Have stopped fluids altogether now that she is comfort care.  Hyponatremia Felt to be secondary to progressive volume status and not tolerating dialysis prior to admission.  Currently at 130.  After being made comfort care, labs no longer checked.  Hypomagnesemia Replacing IV Mg-sulfate for Mg 1.5 (12/17).  No further labs now that she is comfort care  Essential hypertension Initially placed on home medications.  Medications discontinued now that she is comfort care  End-stage renal disease on hemodialysis [facility]) Nephrology consulted for dialysis.  After discussion with plan of care patient is Maintelyte that she wishes to stop dialysis and has taken steps to ensure that her wishes are followed including a living will completed 12/21 as patient had concerns that once uremia started, her wishes may be reversed by her healthcare power of attorney.  She felt this this will would ensure her wishes are continued.  Now fully made comfort care.  Dyslipidemia Continue statin  Peripheral neuropathy Continue Neurontin.  Chronic diarrhea Pt has hx of recurrent C diff. Follows with GI for multiple chronic GI issues.   Last C diff screen positive, but pt was not having diarrhea or signs of infection, GI recommended no treatment.  Son reports sudden onset diarrhea right after PO intake at home.   This could possibly be from IBS.  Attempted trial of rectal tube, but patient could not tolerate.  Exocrine pancreatic insufficiency Follows with GI.  Initially on Creon, discontinued after comfort  care  Recurrent Clostridioides difficile infection Not likely active infection.  She is followed by GI for recurrent C diff and had repeat stool studies on  12/3 which were antigen positive, toxin negative, but PCR positive. Pt has intractable N/V but has denied recent diarrhea.  No treatment indicated at this time.          Subjective: Complains of pain on her backside.  Did not sleep very well last night.   Physical Exam: Vitals:   09/11/22 1449 09/11/22 2344 09/12/22 0900 09/12/22 1737  BP: (!) 126/53 (!) 112/53 (!) 112/50 123/62  Pulse: 74 61 68 71  Resp: '16 16 17 16  '$ Temp: 98.8 F (37.1 C) 98.1 F (36.7 C) 98 F (36.7 C) 98.8 F (37.1 C)  TempSrc:   Oral Oral  SpO2: 100% 100% 100% 100%  Weight:      Height:       General exam: No acute distress, currently calm HEENT: Hard of hearing Respiratory system: Clear to auscultate bilaterally Cardiovascular system: Regular rate and rhythm, S1-S2 Gastrointestinal system: Soft, nontender nondistended, hypoactive bowel sounds    Data Reviewed: No further labs now that she is comfort care   Family Communication: Updated son at the bedside  Disposition: Status is: Inpatient  Remains admitted due to hospice facility availability    Planned Discharge Destination: Looking at hospice facility availability      Author: Annita Brod, MD 09/13/2022 2:56 PM  For on call review [email or web address].

## 2022-09-13 NOTE — Plan of Care (Signed)

## 2022-09-13 NOTE — Progress Notes (Deleted)
Patient son is requesting a glucose pill which are only scheduled twice a day.  She gets her next one at 0800.  Her sugar is 72, she is comfort and he is concerned that her BG will bottom out over night  before she has a chance to see her family for the last time.

## 2022-09-14 DIAGNOSIS — R112 Nausea with vomiting, unspecified: Secondary | ICD-10-CM | POA: Diagnosis not present

## 2022-09-14 DIAGNOSIS — E871 Hypo-osmolality and hyponatremia: Secondary | ICD-10-CM | POA: Diagnosis not present

## 2022-09-14 DIAGNOSIS — E1143 Type 2 diabetes mellitus with diabetic autonomic (poly)neuropathy: Secondary | ICD-10-CM | POA: Diagnosis not present

## 2022-09-14 LAB — GLUCOSE, CAPILLARY
Glucose-Capillary: 54 mg/dL — ABNORMAL LOW (ref 70–99)
Glucose-Capillary: 46 mg/dL — ABNORMAL LOW (ref 70–99)

## 2022-09-14 MED ORDER — MORPHINE SULFATE 10 MG/5ML PO SOLN: 5.0000 mg | ORAL | Status: DC

## 2022-09-14 MED ORDER — MORPHINE BOLUS VIA INFUSION
1.0000 mg | Freq: Once | INTRAVENOUS | Status: AC
  Administered 2022-09-14: 1 mg via INTRAVENOUS
  Filled 2022-09-14: qty 1

## 2022-09-14 MED ORDER — MORPHINE 100MG IN NS 100ML (1MG/ML) PREMIX INFUSION
1.0000 mg/h | INTRAVENOUS | Status: DC
  Administered 2022-09-14: 1 mg/h via INTRAVENOUS
  Filled 2022-09-14: qty 100

## 2022-09-14 MED ORDER — MORPHINE SULFATE (CONCENTRATE) 10 MG/0.5ML PO SOLN
5.0000 mg | ORAL | Status: DC
  Administered 2022-09-14 (×2): 5 mg via ORAL
  Filled 2022-09-14 (×2): qty 0.5

## 2022-09-14 NOTE — Progress Notes (Signed)
There was no veins to put in the Midline on Rt. Arm. There was only one vein on Lt. Arm however, patient hadn't keep arm longer same position due to so much pain. Patient wasn't enough to keep arm to do Midline placement. The best way is regular USPIV at this case. Placed USPIV instead Midline. HS [employer]

## 2022-09-14 NOTE — Progress Notes (Signed)
Progress Note   Patient: [name] DOB: 1960-11-06 DOA: 09/03/2022     9 DOS: the patient was seen and examined on 09/14/2022   Brief hospital course: [name] is a 61 y.o. female with medical history significant for end-stage renal disease on hemodialysis on MWF, asthma, osteoarthritis, chronic diastolic CHF, COPD, coronary artery disease, type 2 diabetes mellitus, gastroparesis, GERD, diverticulosis and peripheral neuropathy, who presented to the ER 09/03/2022 for evaluation of intractable nausea and vomiting since evening before.  No bilious vomitus or hematemesis.  She admits to epigastric abdominal pain and denies any diarrhea.  Gastroenterology consulted for persistent N/V and several chronic GI issues with planned outpatient endoscopies scheduled.  Patient appears to have improvement with scheduled Phenergan and Reglan.  Palliative care has been following patient and after extensive discussions, patient states that she is tired of having dialysis.  She has mentioned this before to family and even to the staff at the dialysis center, but they seem to dismiss her wishes.  She states that she has concerns about her children keeping her going should she stop treatments.  After extensive discussion patient has opted for living will which prevents deviating from her wishes so that she can stop her dialysis.  Patient transitioned to comfort care with plans to transition to hospice facility.  Assessment and Plan: * Intractable nausea and vomiting Initially from uncontrolled gastroparesis.  Taken off Reglan outpatient for unclear reasons.  Much improved with Phenergan and Reglan.  Stopped Reglan on 12/24 due to continued diarrhea.  Nausea however is persisting due to continued uremia.  Have lost IV access unfortunately and oral medications are not enough.  Will have to request midline unfortunately.  Gastroparesis due to DM [facility]) Documented allergy to Reglan. Not on motility  agent. PO Phenergan scheduled (as of 12/16 to monitor improvement in N/V, dry heaves). GI is following. Trial Reglan while here (documented allergy vs intolerance but remote & pt does not recall details)  Nausea and vomiting doing much better with Reglan and scheduled Phenergan.  Type 1 diabetes mellitus with hypoglycemia without coma, without long-term current use of insulin (HCC) Hypoglycemia due to N/V and lack of PO intake. 12/19: sugars only 70-90's on D10 drip.  Have stopped fluids and checking for blood sugars altogether now that she is comfort care.  Will order some glucose gel intermittently.   Hyponatremia Felt to be secondary to progressive volume status and not tolerating dialysis prior to admission.  Currently at 130.  After being made comfort care, labs no longer checked.  Hypomagnesemia Replacing IV Mg-sulfate for Mg 1.5 (12/17).  No further labs now that she is comfort care  Essential hypertension Initially placed on home medications.  Medications discontinued now that she is comfort care  End-stage renal disease on hemodialysis [facility]) Nephrology consulted for dialysis.  After discussion with plan of care patient is Maintelyte that she wishes to stop dialysis and has taken steps to ensure that her wishes are followed including a living will completed 12/21 as patient had concerns that once uremia started, her wishes may be reversed by her healthcare power of attorney.  She felt this this will would ensure her wishes are continued.  Now fully made comfort care.  Dyslipidemia Continue statin  Peripheral neuropathy Continue Neurontin.  Chronic diarrhea Pt has hx of recurrent C diff. Follows with GI for multiple chronic GI issues.   Last C diff screen positive, but pt was not having diarrhea or signs of infection,  GI recommended no treatment.  Son reports sudden onset diarrhea right after PO intake at home.   This could possibly be from IBS.  Attempted trial of rectal  tube, but patient could not tolerate.  Having significant pain on her backside, so we will add over overlay mattress plus morphine drip.  Exocrine pancreatic insufficiency Follows with GI.  Initially on Creon, discontinued after comfort care  Recurrent Clostridioides difficile infection Not likely active infection.  She is followed by GI for recurrent C diff and had repeat stool studies on 12/3 which were antigen positive, toxin negative, but PCR positive. Pt has intractable N/V but has denied recent diarrhea.  No treatment indicated at this time.          Subjective: Continues to have persistent nausea   Physical Exam: Vitals:   09/11/22 2344 09/12/22 0900 09/12/22 1737 09/14/22 0030  BP: (!) 112/53 (!) 112/50 123/62   Pulse: 61 68 71 67  Resp: '16 17 16 16  '$ Temp: 98.1 F (36.7 C) 98 F (36.7 C) 98.8 F (37.1 C) 98 F (36.7 C)  TempSrc:  Oral Oral   SpO2: 100% 100% 100% 100%  Weight:      Height:       General exam: No acute distress, currently calm HEENT: Hard of hearing Respiratory system: Clear to auscultate bilaterally Cardiovascular system: Regular rate and rhythm, S1-S2 Gastrointestinal system: Soft, nontender nondistended, hypoactive bowel sounds    Data Reviewed: No further labs now that she is comfort care   Family Communication: Updated son at the bedside  Disposition: Status is: Inpatient  Remains admitted due to hospice facility availability    Planned Discharge Destination: Looking at hospice facility availability      Author: Annita Brod, MD 09/14/2022 5:04 PM  For on call review [email or web address].

## 2022-09-14 NOTE — Plan of Care (Signed)
  Problem: Education: Goal: Ability to describe self-care measures that may prevent or decrease complications (Diabetes Survival Skills Education) will improve Outcome: Progressing Goal: Individualized Educational Video(s) Outcome: Progressing   

## 2022-09-14 NOTE — Plan of Care (Signed)
Lost IV access.  IV team unable to place new IV.  Provider notified.     Problem: Education: Goal: Ability to describe self-care measures that may prevent or decrease complications (Diabetes Survival Skills Education) will improve Outcome: Progressing Goal: Individualized Educational Video(s) Outcome: Progressing   Problem: Coping: Goal: Ability to adjust to condition or change in health will improve Outcome: Progressing   Problem: Fluid Volume: Goal: Ability to maintain a balanced intake and output will improve Outcome: Progressing   Problem: Health Behavior/Discharge Planning: Goal: Ability to identify and utilize available resources and services will improve Outcome: Progressing Goal: Ability to manage health-related needs will improve Outcome: Progressing   Problem: Metabolic: Goal: Ability to maintain appropriate glucose levels will improve Outcome: Progressing   Problem: Nutritional: Goal: Maintenance of adequate nutrition will improve Outcome: Progressing Goal: Progress toward achieving an optimal weight will improve Outcome: Progressing   Problem: Skin Integrity: Goal: Risk for impaired skin integrity will decrease Outcome: Progressing   Problem: Tissue Perfusion: Goal: Adequacy of tissue perfusion will improve Outcome: Progressing   Problem: Education: Goal: Knowledge of General Education information will improve Description: Including pain rating scale, medication(s)/side effects and non-pharmacologic comfort measures Outcome: Progressing   Problem: Health Behavior/Discharge Planning: Goal: Ability to manage health-related needs will improve Outcome: Progressing   Problem: Clinical Measurements: Goal: Ability to maintain clinical measurements within normal limits will improve Outcome: Progressing Goal: Will remain free from infection Outcome: Progressing Goal: Diagnostic test results will improve Outcome: Progressing Goal: Respiratory complications  will improve Outcome: Progressing Goal: Cardiovascular complication will be avoided Outcome: Progressing   Problem: Activity: Goal: Risk for activity intolerance will decrease Outcome: Progressing   Problem: Nutrition: Goal: Adequate nutrition will be maintained Outcome: Progressing   Problem: Coping: Goal: Level of anxiety will decrease Outcome: Progressing   Problem: Elimination: Goal: Will not experience complications related to bowel motility Outcome: Progressing Goal: Will not experience complications related to urinary retention Outcome: Progressing   Problem: Pain Managment: Goal: General experience of comfort will improve Outcome: Progressing   Problem: Safety: Goal: Ability to remain free from injury will improve Outcome: Progressing   Problem: Skin Integrity: Goal: Risk for impaired skin integrity will decrease Outcome: Progressing

## 2022-09-14 NOTE — Progress Notes (Signed)
       CROSS COVER NOTE  NAME: [name] MRN: [id number] DOB : 1961/04/15 ATTENDING PHYSICIAN: Annita Brod, MD    Date of Service   09/14/2022   HPI/Events of Note   M(r)s [name] now had IV access reestablished. Family is requesting morphine gtt be restarted. M(r)s [name] is ESRD but per RN family reports previous episodes of dilaudid causing hallucinations.  Interventions   Assessment/Plan:  Morphine drip restarted       To reach the provider On-Call:   7AM- 7PM see care teams to locate the attending and reach out to them via [email or web address]. 7PM-7AM contact night-coverage If you still have difficulty reaching the appropriate provider, please page the [facility] (Director on Call) for [facility] on amion for assistance  This document was prepared using Set designer software and may include unintentional dictation errors.  Neomia Glass DNP, MBA, FNP-BC Nurse Practitioner [facility] Pager [phone]

## 2022-09-15 DIAGNOSIS — R112 Nausea with vomiting, unspecified: Secondary | ICD-10-CM | POA: Diagnosis not present

## 2022-09-15 DIAGNOSIS — Z515 Encounter for palliative care: Secondary | ICD-10-CM

## 2022-09-15 DIAGNOSIS — E1143 Type 2 diabetes mellitus with diabetic autonomic (poly)neuropathy: Secondary | ICD-10-CM | POA: Diagnosis not present

## 2022-09-15 DIAGNOSIS — N186 End stage renal disease: Secondary | ICD-10-CM | POA: Diagnosis not present

## 2022-09-15 LAB — GLUCOSE, CAPILLARY
Glucose-Capillary: 41 mg/dL — CL (ref 70–99)
Glucose-Capillary: 42 mg/dL — CL (ref 70–99)
Glucose-Capillary: 32 mg/dL — CL (ref 70–99)

## 2022-09-15 MED ORDER — TRAZODONE HCL 50 MG PO TABS: 25.0000 mg | ORAL_TABLET | Freq: Every day | ORAL | Status: DC

## 2022-09-15 MED ORDER — PROMETHAZINE HCL 12.5 MG PO TABS: 12.5000 mg | ORAL_TABLET | Freq: Four times a day (QID) | ORAL | 0 refills | Status: DC

## 2022-09-15 MED ORDER — MORPHINE 100MG IN NS 100ML (1MG/ML) PREMIX INFUSION: 1.0000 mg/h | INTRAVENOUS | 0 refills | Status: DC

## 2022-09-15 MED ORDER — MORPHINE SULFATE (PF) 2 MG/ML IV SOLN: 2.0000 mg | INTRAVENOUS | 0 refills | Status: DC | PRN

## 2022-09-15 NOTE — Progress Notes (Signed)
Daily Progress Note   Patient Name: [name]       Date: 09/15/2022 DOB: [birth year]  Age: 61 y.o. MRN#: [id number] Attending Physician: Annita Brod, MD Primary Care Physician: Denton Lank, MD Admit Date: 09/03/2022  Reason for Consultation/Follow-up: Establishing goals of care  Subjective: Notes reviewed. Patient is on comfort care and awaiting hospice facility placement. In to see patient to ensure symptom management and to answer any questions she may have, but patient is currently resting comfortably in bed, sleeping. No distress noted. Plans for hospice facility placement once consents are signed.   Length of Stay: 10  Current Medications: Scheduled Meds:   cholecalciferol  1,000 Units Oral Daily   feeding supplement (NEPRO CARB STEADY)  237 mL Oral BID BM   glucose  1 tablet Oral BID   HYDROcodone-acetaminophen  1 tablet Oral Q8H   promethazine  12.5 mg Oral Q6H   traZODone  25 mg Oral QHS    Continuous Infusions:  morphine 1 mg/hr (09/14/22 2121)    PRN Meds: albuterol, alum & mag hydroxide-simeth, hydrOXYzine, morphine injection  Physical Exam Constitutional:      Comments: Eyes closed.   Pulmonary:     Effort: Pulmonary effort is normal.             Vital Signs: BP (!) 129/57 (BP Location: Left Arm)   Pulse 71   Temp (!) 97.5 F (36.4 C)   Resp 16   Ht '5\' 5"'$  (1.651 m)   Wt 80.2 kg   SpO2 100%   BMI 29.42 kg/m  SpO2: SpO2: 100 % O2 Device: O2 Device: Nasal Cannula O2 Flow Rate: O2 Flow Rate (L/min): 3 L/min  Intake/output summary: No intake or output data in the 24 hours ending 09/15/22 1200 LBM: Last BM Date :  (Rectal tube in place.) Baseline Weight: Weight: 79.2 kg Most recent weight: Weight: 80.2 kg   Patient Active Problem List    Diagnosis Date Noted   Chronic diarrhea 09/09/2022   Hypomagnesemia 09/07/2022   Recurrent Clostridioides difficile infection 09/04/2022   Exocrine pancreatic insufficiency 09/04/2022   Intractable nausea and vomiting 09/03/2022   Essential hypertension 09/03/2022   Dyslipidemia 09/03/2022   Peripheral neuropathy 09/03/2022   Type 1 diabetes mellitus with hypoglycemia without coma, without long-term current use of insulin ([place]) 09/03/2022  Debility 07/04/2022   Acute colitis 07/02/2022   Venous ulcer of ankle, right ([place]) 06/30/2022   Pericardial effusion 12/05/2021   Hearing impairment 12/04/2021   Hyponatremia 11/27/2021   COVID-19 virus infection 11/27/2021   Bacterial pneumonia 11/27/2021   Acute GI bleeding 11/15/2021   Upper GI bleed 11/15/2021   Anemia of chronic kidney failure 05/03/2021   Thrombocytopenia ([place]) 05/03/2021   Severe sepsis with septic shock ([place]) 04/25/2021   HCAP (healthcare-associated pneumonia) 41/93/7902   Acute metabolic encephalopathy 40/97/3532   UTI (urinary tract infection) 03/29/2021   Chronic respiratory failure with hypoxia ([place]) 12/18/2020   Superior vena caval syndrome 12/14/2020   Chronic diastolic CHF (congestive heart failure) ([place]) 12/04/2020   Bradycardia 09/17/2020   Advanced care planning/counseling discussion    Discitis of thoracic region    Fluid overload 08/10/2020   HLD (hyperlipidemia) 08/10/2020   Chest pain 08/10/2020   CAD (coronary artery disease) 08/10/2020   GERD (gastroesophageal reflux disease) 08/10/2020   Obesity (BMI 30-39.9) 06/06/2020   Anemia in ESRD (end-stage renal disease) ([place]) 05/31/2020   Type 1 diabetes mellitus ([place]) 05/31/2020   End-stage renal disease ([place]) 05/31/2020   Hypertensive disorder 05/31/2020   Arm pain 05/21/2020   Cellulitis of right leg 05/02/2020   ESRD (end stage renal disease) ([place]) 04/13/2020   SOBOE (shortness of breath on exertion) 02/08/2020   Hematoma of arm, left, subsequent  encounter 11/24/2019   Respiratory failure (HCC)    Shortness of breath    Complication of arteriovenous dialysis fistula 11/13/2019   PVD (peripheral vascular disease) ([place]) 11/13/2019   Severe anemia 11/13/2019   Pressure ulcer, stage 2 ([place]) 11/13/2019   Hemorrhagic shock ([place]) 11/11/2019   Leg pain 10/02/2019   CAP (community acquired pneumonia) 10/01/2019   Atherosclerosis of native arteries of extremity with intermittent claudication ([place]) 05/01/2019   Right-sided headache    COPD (chronic obstructive pulmonary disease) ([place]) 10/06/2018   Syncope 10/04/2018   Symptomatic anemia 06/18/2018   Gastroparesis due to DM ([place]) 99/24/2683   Complication of vascular access for dialysis 12/04/2017   Osteomyelitis ([place]) 09/30/2017   Carotid stenosis 06/18/2017   Bilateral carotid artery stenosis 06/16/2017   Benign essential HTN 05/19/2017   CKD (chronic kidney disease) stage 5, GFR less than 15 ml/min ([place]) 05/19/2017   Hyperlipidemia, mixed 05/19/2017   Shortness of breath 05/04/2017   Rash, skin 12/16/2016   Cellulitis of lower extremity 07/29/2016   Chronic venous insufficiency 07/29/2016   Lymphedema 07/29/2016   TIA (transient ischemic attack) 04/21/2016   Altered mental status 04/08/2016   Hyperammonemia ([place]) 04/08/2016   Elevated troponin 04/08/2016   Depression 04/08/2016   Depression, major, recurrent, severe with psychosis ([place]) 04/08/2016   Blood in stool    Intractable cyclical vomiting with nausea    Reflux esophagitis    Gastritis    Generalized abdominal pain    Uncontrollable vomiting    Major depressive disorder, recurrent episode, moderate ([place]) 03/15/2016   Adjustment disorder with mixed anxiety and depressed mood 03/15/2016   Malnutrition of moderate degree 12/01/2015   Renal mass    Dyspnea    Acute renal failure ([place])    Respiratory failure ([place])    Fever 11/14/2015   Encounter for central line placement    Encounter for orogastric (OG) tube  placement    Nausea 11/12/2015   Hyperkalemia 10/03/2015   Diarrhea, unspecified 07/22/2015   Hypoglycemia 04/24/2015   Unresponsiveness 04/24/2015   Sinus bradycardia 04/24/2015   Hypothermia 04/24/2015  Acute hypoxemic respiratory failure (HCC) 04/24/2015   Acute on chronic diastolic CHF (congestive heart failure) ([place]) 04/05/2015   Diabetic gastroparesis ([place]) 04/05/2015   Hypokalemia 04/05/2015   Physical debility 04/05/2015   Acute pulmonary edema (HCC) 04/03/2015   Nausea and vomiting 04/03/2015   Hypoglycemia associated with diabetes ([place]) 04/03/2015   Anemia of chronic disease 04/03/2015   Secondary hyperparathyroidism ([place]) 04/03/2015   Pressure ulcer 04/02/2015   Acute respiratory failure with hypoxia ([place]) 04/01/2015   Adjustment disorder with anxiety 03/14/2015   Somatic symptom disorder, mild 03/08/2015   Coronary artery disease involving native coronary artery of native heart without angina pectoris    Nausea & vomiting 03/06/2015   Abdominal pain 03/06/2015   Hypoglycemia in type 2 diabetes mellitus , without long-term current use of insulin ([place]) 03/06/2015   HTN (hypertension) 03/06/2015   Gastroparesis 02/24/2015   Pleural effusion 02/19/2015   Bacteremia due to Enterococcus 02/19/2015   End-stage renal disease on hemodialysis ([place]) 02/19/2015   Diarrhea 08/01/2014    Palliative Care Assessment & Plan    Recommendations/Plan: Waiting for hospice facility bed.   Code Status:    Code Status Orders  (From admission, onward)           Start     Ordered   09/11/22 1604  Do not attempt resuscitation (DNR)  Continuous       Question Answer Comment  If patient has no pulse and is not breathing Do Not Attempt Resuscitation   If patient has a pulse and/or is breathing: Medical Treatment Goals COMFORT MEASURES: Keep clean/warm/dry, use medication by any route; positioning, wound care and other measures to relieve pain/suffering; use oxygen, suction/manual  treatment of airway obstruction for comfort; do not transfer unless for comfort needs.   Consent: Discussion documented in EHR or advanced directives reviewed      09/11/22 1603           Code Status History     Date Active Date Inactive Code Status Order ID Comments User Context   09/03/2022 2033 09/11/2022 1603 Full Code [id number]  Mansy, Arvella Merles, MD ED   07/02/2022 0040 07/09/2022 0103 Full Code [id number]  Athena Masse, MD ED   04/28/2022 2140 05/05/2022 2307 Full Code [id number]  Para Skeans, MD ED   11/27/2021 0542 12/07/2021 0225 Full Code [id number]  Athena Masse, MD ED   11/15/2021 1739 11/17/2021 1743 Full Code [id number]  Sharen Hones, MD ED   04/30/2021 1444 05/10/2021 2330 DNR [id number]  Milus Banister, NP Inpatient   04/26/2021 1432 04/30/2021 1444 Full Code [id number]  Ottie Glazier, MD ED   04/25/2021 2123 04/26/2021 1432 Full Code [id number]  Howerter, Ethelda Chick, DO ED   03/29/2021 0244 04/02/2021 2300 Full Code [id number]  Athena Masse, MD ED   12/04/2020 2042 12/17/2020 2147 DNR [id number]  Ileene Musa T, DO ED   12/04/2020 1939 12/04/2020 2042 Full Code [id number]  Orene Desanctis, DO ED   09/17/2020 1431 09/21/2020 1925 Full Code [id number]  Tyna Jaksch, MD ED   08/10/2020 1323 08/17/2020 2223 Full Code [id number]  Ivor Costa, MD ED   06/02/2020 2251 06/25/2020 2316 Full Code [id number]  Elwyn Reach, MD ED   05/22/2020 1201 05/23/2020 2050 Full Code [id number]  Vashti Hey, MD ED   05/02/2020 2057 05/06/2020 1929 Full Code [id number]  Mansy, Arvella Merles, MD ED   04/13/2020 1458 04/14/2020 2239 Full Code [id number]  Stegmayer, Janalyn Harder, PA-C Inpatient  11/24/2019 2038 11/30/2019 1824 Full Code [id number]  Artist Beach, MD Inpatient   11/11/2019 0715 11/19/2019 0006 Full Code [id number]  Awilda Bill, NP ED   09/30/2019 2126 10/01/2019 2257 Full Code [id number]  Nolberto Hanlon, MD ED   10/04/2018 1847 10/12/2018 0027 Full Code [id number]  Dustin Flock, MD Inpatient   06/18/2018 0449  06/20/2018 1642 Full Code [id number]  Arta Silence, MD ED   02/12/2018 1312 02/16/2018 0050 Full Code [id number]  Epifanio Lesches, MD ED   01/17/2018 1848 01/21/2018 1341 Full Code [id number]  Vaughan Basta, MD Inpatient   09/30/2017 1804 10/15/2017 2238 Full Code [id number]  Vaughan Basta, MD Inpatient   09/03/2017 2243 09/05/2017 1901 Full Code [id number]  Lance Coon, MD Inpatient   05/04/2017 1833 05/05/2017 2330 Full Code [id number]  Henreitta Leber, MD Inpatient   03/23/2017 1551 03/25/2017 1739 Full Code [id number]  Gladstone Lighter, MD Inpatient   04/21/2016 1559 04/21/2016 1717 Full Code [id number]  Nicholes Mango, MD Inpatient   04/08/2016 1415 04/11/2016 1947 Full Code [id number]  Theodoro Grist, MD Inpatient   03/15/2016 1322 03/20/2016 0019 Full Code [id number]  Bettey Costa, MD Inpatient   02/16/2016 1409 02/19/2016 2238 Full Code [id number]  Bettey Costa, MD ED   12/17/2015 1139 12/21/2015 2209 Full Code [id number]  Dustin Flock, MD ED   11/12/2015 1602 12/01/2015 1943 Full Code [id number]  Bettey Costa, MD Inpatient   10/07/2015 0937 10/12/2015 1959 Full Code [id number]  Fritzi Mandes, MD ED   10/03/2015 0355 10/05/2015 2230 Full Code [id number]  Lance Coon, MD Inpatient   08/31/2015 1600 09/03/2015 2209 Full Code [id number]  Demetrios Loll, MD ED   07/26/2015 1528 07/26/2015 2136 Full Code [id number]  Dionisio David, MD Inpatient   07/22/2015 1736 07/26/2015 1528 Full Code [id number]  Vaughan Basta, MD Inpatient   06/05/2015 2039 06/07/2015 2003 Full Code [id number]  Bettey Costa, MD Inpatient   05/21/2015 2142 05/24/2015 1626 Full Code [id number]  Henreitta Leber, MD Inpatient   04/24/2015 0855 04/26/2015 2211 Full Code [id number]  Demetrios Loll, MD Inpatient   04/01/2015 1558 04/05/2015 2007 Full Code [id number]  Epifanio Lesches, MD ED   03/06/2015 0805 03/09/2015 2111 Full Code [id number]  Juluis Mire, MD Inpatient   02/19/2015 1614 02/24/2015 1741 Full Code [id number]  Aldean Jewett, MD  Inpatient       Prognosis:  < 2 weeks    Care plan was discussed with RN  Thank you for allowing the Palliative Medicine Team to assist in the care of this patient.   Asencion Gowda, NP  Please contact Palliative Medicine Team phone at [phone] for questions and concerns.

## 2022-09-15 NOTE — Discharge Summary (Signed)
Physician Discharge Summary   Patient: [name] MRN: [id number] DOB: September 05, 1961  Admit date:     09/03/2022  Discharge date: 09/15/22  Discharge Physician: Annita Brod   PCP: Denton Lank, MD   Recommendations at discharge:   Patient is being discharged to hospice facility.  Most of her home medications have been discontinued as she is now comfort care Discharge medications include morphine drip as needed IV morphine for breakthrough pain, as needed Phenergan as needed trazodone for sleep.  Medications to be adjusted as per hospice facility attending physician.  Discharge Diagnoses: Principal Problem:   Intractable nausea and vomiting Active Problems:   Gastroparesis due to DM (HCC)   Hyponatremia   Type 1 diabetes mellitus with hypoglycemia without coma, without long-term current use of insulin (HCC)   End-stage renal disease on hemodialysis (HCC)   Essential hypertension   Hypomagnesemia   Dyslipidemia   Peripheral neuropathy   Recurrent Clostridioides difficile infection   Exocrine pancreatic insufficiency   Chronic diarrhea  Resolved Problems:   * No resolved hospital problems. *  Hospital Course: [name] is a 61 y.o. female with medical history significant for end-stage renal disease on hemodialysis on MWF, asthma, osteoarthritis, chronic diastolic CHF, COPD, coronary artery disease, type 2 diabetes mellitus, gastroparesis, GERD, diverticulosis and peripheral neuropathy, who presented to the ER 09/03/2022 for evaluation of intractable nausea and vomiting since evening before.  No bilious vomitus or hematemesis.  She admits to epigastric abdominal pain and denies any diarrhea.  Gastroenterology consulted for persistent N/V and several chronic GI issues with planned outpatient endoscopies scheduled.  Patient appears to have improvement with scheduled Phenergan and Reglan.  Palliative care has been following patient and after extensive discussions, patient states  that she is tired of having dialysis.  She has mentioned this before to family and even to the staff at the dialysis center, but they seem to dismiss her wishes.  She states that she has concerns about her children keeping her going should she stop treatments.  After extensive discussion patient has opted for living will which prevents deviating from her wishes so that she can stop her dialysis.  Patient transitioned to comfort care with plans to transition to hospice facility.  Assessment and Plan: * Intractable nausea and vomiting Initially from uncontrolled gastroparesis.  Taken off Reglan outpatient for unclear reasons.  Much improved with Phenergan and Reglan.  Stopped Reglan on 12/24 due to continued diarrhea.  Patient started having more persistent nausea despite oral Phenergan secondary to uremia.  IV team placed ultrasound-guided IV and medications resumed.  Gastroparesis due to DM [facility]) Documented allergy to Reglan. Not on motility agent. PO Phenergan scheduled (as of 12/16 to monitor improvement in N/V, dry heaves). GI is following. Trial Reglan while here (documented allergy vs intolerance but remote & pt does not recall details)  Nausea and vomiting doing much better with Reglan and scheduled Phenergan.  Type 1 diabetes mellitus with hypoglycemia without coma, without long-term current use of insulin (HCC) Hypoglycemia due to N/V and lack of PO intake. 12/19: sugars only 70-90's on D10 drip.  Have stopped fluids and checking for blood sugars altogether now that she is comfort care.  Will order some glucose gel intermittently.   Hyponatremia Felt to be secondary to progressive volume status and not tolerating dialysis prior to admission.  Currently at 130.  After being made comfort care, labs no longer checked.  Hypomagnesemia Replacing IV Mg-sulfate for Mg 1.5 (12/17).  No  further labs now that she is comfort care  Essential hypertension Initially placed on home medications.   Medications discontinued now that she is comfort care  End-stage renal disease on hemodialysis [facility]) Nephrology consulted for dialysis.  After discussion with plan of care patient is [place] that she wishes to stop dialysis and has taken steps to ensure that her wishes are followed including a living will completed 12/21 as patient had concerns that once uremia started, her wishes may be reversed by her healthcare power of attorney.  She felt this this will would ensure her wishes are continued.  Now fully made comfort care.  Dyslipidemia Statin discontinued after comfort care  Peripheral neuropathy Continue Neurontin, stopped after comfort care.  Chronic diarrhea Pt has hx of recurrent C diff. Follows with GI for multiple chronic GI issues.   Last C diff screen positive, but pt was not having diarrhea or signs of infection, GI recommended no treatment.  Son reports sudden onset diarrhea right after PO intake at home.   This could possibly be from IBS.  Attempted trial of rectal tube, but patient could not tolerate.  Having significant pain on her backside, so we will add over overlay mattress plus morphine drip.  Exocrine pancreatic insufficiency Follows with GI.  Initially on Creon, discontinued after comfort care  Recurrent Clostridioides difficile infection Not likely active infection.  She is followed by GI for recurrent C diff and had repeat stool studies on 12/3 which were antigen positive, toxin negative, but PCR positive. Pt has intractable N/V but has denied recent diarrhea.  No treatment indicated at this time.           Consultants: Nephrology, palliative care Procedures performed: None Disposition: Hospice facility Diet recommendation:  Discharge Diet Orders (From admission, onward)     Start     Ordered   09/15/22 0000  Diet general        09/15/22 1022           Regular diet DISCHARGE MEDICATION: Allergies as of 09/15/2022       Reactions   Ace  Inhibitors Swelling, Anaphylaxis   Ativan [lorazepam] Other (See Comments)   Reaction:  Hallucinations and headaches   Compazine [prochlorperazine Edisylate] Anaphylaxis, Nausea And Vomiting, Other (See Comments)   Other reaction(s): dystonia from this vs. Reglan, 23 Jul - patient relates that she takes promethazine frequently with no problems   Sumatriptan Succinate Other (See Comments)   Other reaction(s): delirium and hallucinations per [facility] records   Zofran [ondansetron] Nausea And Vomiting   Per pt. she is allergic to zofran or will experience adverse reaction like hallucination    Losartan Nausea Only   Prochlorperazine Other (See Comments)   Reaction:  Unknown . Patient does not remember reaction but she does have vertigo and anxiety along with n and v at times. Could be used to treat any of these   Reglan [metoclopramide] Other (See Comments)   Per patient her Dr. Evelina Bucy her off it    Scopolamine Other (See Comments)   Dizziness, also has vertigo already   Tape Rash   Plastic tape causes rash   Tapentadol Rash   Ultrasound Gel Itching   Patient states the Ultrasound gel caused itching while in skin contact and resolved when wiped away.        Medication List     STOP taking these medications    amLODipine 10 MG tablet Commonly known as: NORVASC   ammonium lactate 12 % lotion Commonly  known as: LAC-HYDRIN   aspirin EC 81 MG tablet   atorvastatin 20 MG tablet Commonly known as: LIPITOR   calcium carbonate 500 MG chewable tablet Commonly known as: TUMS - dosed in mg elemental calcium   colestipol 1 g tablet Commonly known as: COLESTID   Creon 36000 UNITS Cpep capsule Generic drug: lipase/protease/amylase   cyanocobalamin 1000 MCG tablet   epoetin alfa 10000 UNIT/ML injection Commonly known as: EPOGEN   feeding supplement (NEPRO CARB STEADY) Liqd   fluticasone 50 MCG/ACT nasal spray Commonly known as: FLONASE   furosemide 80 MG tablet Commonly known as:  LASIX   gabapentin 100 MG capsule Commonly known as: NEURONTIN   hydrALAZINE 100 MG tablet Commonly known as: APRESOLINE   HYDROcodone-acetaminophen 5-325 MG tablet Commonly known as: NORCO/VICODIN   hydrOXYzine 25 MG tablet Commonly known as: ATARAX   Iron 325 (65 Fe) MG Tabs   lidocaine 5 % Commonly known as: LIDODERM   meclizine 25 MG tablet Commonly known as: ANTIVERT   mirtazapine 7.5 MG tablet Commonly known as: REMERON   multivitamin Tabs tablet   mupirocin ointment 2 % Commonly known as: BACTROBAN   pantoprazole 40 MG tablet Commonly known as: PROTONIX   PROBIOTIC PO   sevelamer carbonate 800 MG tablet Commonly known as: RENVELA   Vitamin D3 25 MCG (1000 UT) Caps       TAKE these medications    acetaminophen 325 MG tablet Commonly known as: TYLENOL Take 2 tablets (650 mg total) by mouth every 6 (six) hours as needed for mild pain, headache, moderate pain or fever (fever >/= 101).   albuterol 108 (90 Base) MCG/ACT inhaler Commonly known as: VENTOLIN HFA Inhale 2 puffs into the lungs every 4 (four) hours as needed for wheezing or shortness of breath.   alum & mag hydroxide-simeth 200-200-20 MG/5ML suspension Commonly known as: MAALOX/MYLANTA Take 15 mLs by mouth every 6 (six) hours as needed for indigestion or heartburn.   morphine (PF) 2 MG/ML injection Inject 1 mL (2 mg total) into the vein every 3 (three) hours as needed (breakthrough pain).   morphine 1 mg/mL Soln infusion Inject 1 mg/hr into the vein continuous.   promethazine 12.5 MG tablet Commonly known as: PHENERGAN Take 1 tablet (12.5 mg total) by mouth every 6 (six) hours. What changed:  when to take this reasons to take this   traZODone 50 MG tablet Commonly known as: DESYREL Take 0.5 tablets (25 mg total) by mouth at bedtime.        Discharge Exam: Filed Weights   09/08/22 0834 09/09/22 0751 09/09/22 1123  Weight: 81.5 kg 81.2 kg 80.2 kg   General: Oriented x 2, no  acute distress Cardiovascular: Regular rate and rhythm, S1-S2  Condition at discharge: Patient is dying and expected to pass in the next 1 to 2 weeks  The results of significant diagnostics from this hospitalization (including imaging, microbiology, ancillary and laboratory) are listed below for reference.   Imaging Studies: DG UGI W SINGLE CM (SOL OR THIN BA)  Result Date: 09/08/2022 CLINICAL DATA:  Patient presents for single-contrast upper GI series due to intractable vomiting. EXAM: UPPER GI SERIES WITHOUT KUB TECHNIQUE: Routine upper GI series was performed with thin density barium. FLUOROSCOPY: Radiation Exposure Index (as provided by the fluoroscopic device): 64.5 mGy Kerma COMPARISON:  CT 08/24/2022 FINDINGS: Patient presents for a limited single contrast upper GI series. Note that this is a severely limited exam due to patient's difficulty following instructions as well as  severe limited mobility. Exam could only be performed with patient in the semi erect supine position only. Thin barium was given orally. Esophagus is normal in course, contour and caliber without evidence of focal mass or stricture. No hiatal hernia. No aspiration. There is dysmotility with mild-to-moderate stasis of the barium column within the esophagus. Gastroesophageal junction is patent. Multiple tertiary contractions are evident. Contrast will not move distal to the gastric fundus. Attempt was made to roll patient on her right side unsuccessfully. After 10 minutes, contrast had not moved the on the gastric fundus into the mid to distal stomach. There is no evidence gastric peristalsis during real-time fluoroscopic observation which may be due significant gastroparesis. Procedure was terminated at this point. IMPRESSION: 1. Severely limited single contrast upper GI series as described. Moderate esophageal dysmotility as described. 2. No evidence of gastric peristalsis during real-time fluoroscopic observation which may be  due to significant gastroparesis as contrast would not move distal to the gastric fundus. Electronically Signed   By: Marin Olp M.D.   On: 09/08/2022 11:56   CT ABDOMEN PELVIS W CONTRAST  Result Date: 09/03/2022 CLINICAL DATA:  Umbilical abdominal pain.  Patient on dialysis. EXAM: CT ABDOMEN AND PELVIS WITH CONTRAST TECHNIQUE: Multidetector CT imaging of the abdomen and pelvis was performed using the standard protocol following bolus administration of intravenous contrast. RADIATION DOSE REDUCTION: This exam was performed according to the departmental dose-optimization program which includes automated exposure control, adjustment of the mA and/or kV according to patient size and/or use of iterative reconstruction technique. CONTRAST:  136m OMNIPAQUE IOHEXOL 300 MG/ML  SOLN COMPARISON:  07/01/2022 FINDINGS: Lower chest: Progressive septal thickening at the lung bases. Areas of peribronchovascular ground-glass. Moderate cardiomegaly. Right coronary artery calcification. Incompletely imaged central line. Small bilateral pleural effusions. Hepatobiliary: Normal liver. Cholecystectomy. No biliary duct dilatation. Pancreas: Pancreatic atrophy.  No duct dilatation. Spleen: Normal in size, without focal abnormality. Adrenals/Urinary Tract: Normal adrenal glands. Bilateral native renal marked atrophy. Interpolar left renal 1.7 cm cyst or minimally complex cyst . In the absence of clinically indicated signs/symptoms require(s) no independent follow-up. No hydronephrosis. The bladder is moderately thick walled including on 72/3. Stomach/Bowel: The stomach is incompletely distended. Again identified is apparent wall thickening including within the antrum on 34/3. Extensive colonic diverticulosis. Normal terminal ileum. Appendix not visualized. Normal small bowel. Vascular/Lymphatic: Markedly age advanced aortic and branch vessel atherosclerosis. Index left abdominal retroperitoneal 1.1 cm node on 30/3 similar to 1.2  cm on the prior. Gastrohepatic ligament 11 mm node on 19/3 is relatively similar to on the prior. Left inguinal index node measures 1.2 cm on 79/3 versus 1.4 cm on the prior. Left external iliac 1.4 cm node on 68/3 measured 1.5 cm previously. Reproductive: Hysterectomy. Other: No free intraperitoneal air. Small volume abdominopelvic ascites is similar. Diffuse anasarca. Musculoskeletal: Renal osteodystrophy. IMPRESSION: 1.  No acute process in the abdomen or pelvis. 2. Fluid overload, as evidenced by similar anasarca, small volume abdominopelvic ascites, and small bilateral pleural effusions. Worsened bibasilar aeration, likely related to increased interstitial and alveolar edema. 3. Apparent gastric wall thickening which could be partially due to underdistention. Correlate with symptoms of gastritis. 4. Bladder wall thickening, suspicious for cystitis. 5. Similar and mildly improved abdominopelvic adenopathy, favoring a reactive etiology. As before, laboratory and clinical correlation for lymphoproliferative process suggested. 6. Coronary artery atherosclerosis. Aortic Atherosclerosis (ICD10-I70.0). Electronically Signed   By: KAbigail MiyamotoM.D.   On: 09/03/2022 16:15    Microbiology: Results for orders placed or performed  during the hospital encounter of 08/24/22  Gastrointestinal Panel by PCR , Stool     Status: None   Collection Time: 08/24/22  4:20 PM   Specimen: Stool  Result Value Ref Range Status   Campylobacter species NOT DETECTED NOT DETECTED Final   Plesimonas shigelloides NOT DETECTED NOT DETECTED Final   Salmonella species NOT DETECTED NOT DETECTED Final   Yersinia enterocolitica NOT DETECTED NOT DETECTED Final   Vibrio species NOT DETECTED NOT DETECTED Final   Vibrio cholerae NOT DETECTED NOT DETECTED Final   Enteroaggregative E coli (EAEC) NOT DETECTED NOT DETECTED Final   Enteropathogenic E coli (EPEC) NOT DETECTED NOT DETECTED Final   Enterotoxigenic E coli (ETEC) NOT DETECTED NOT  DETECTED Final   Shiga like toxin producing E coli (STEC) NOT DETECTED NOT DETECTED Final   Shigella/Enteroinvasive E coli (EIEC) NOT DETECTED NOT DETECTED Final   Cryptosporidium NOT DETECTED NOT DETECTED Final   Cyclospora cayetanensis NOT DETECTED NOT DETECTED Final   Entamoeba histolytica NOT DETECTED NOT DETECTED Final   Giardia lamblia NOT DETECTED NOT DETECTED Final   Adenovirus F40/41 NOT DETECTED NOT DETECTED Final   Astrovirus NOT DETECTED NOT DETECTED Final   Norovirus GI/GII NOT DETECTED NOT DETECTED Final   Rotavirus A NOT DETECTED NOT DETECTED Final   Sapovirus (I, II, IV, and V) NOT DETECTED NOT DETECTED Final    Comment: Performed at [facility], [address] [place], [place] [address]  C Difficile Quick Screen w PCR reflex     Status: Abnormal   Collection Time: 08/24/22  4:20 PM   Specimen: STOOL  Result Value Ref Range Status   C Diff antigen POSITIVE (A) NEGATIVE Final   C Diff toxin NEGATIVE NEGATIVE Final   C Diff interpretation Results are indeterminate. See PCR results.  Final    Comment: Performed at [facility], [address] [place], [place] [address]  C. Diff by PCR, Reflexed     Status: Abnormal   Collection Time: 08/24/22  4:20 PM  Result Value Ref Range Status   Toxigenic C. Difficile by PCR POSITIVE (A) NEGATIVE Final    Comment: Positive for toxigenic C. difficile with little to no toxin production. Only treat if clinical presentation suggests symptomatic illness. Performed at [facility], [address] [place], [place] [address]     Labs: CBC: Recent Labs  Lab 09/09/22 0809 09/11/22 0951  WBC 4.2 10.6*  HGB 7.8* 7.4*  HCT 24.2* 22.1*  MCV 79.3* 78.1*  PLT 113* 048   Basic Metabolic Panel: Recent Labs  Lab 09/08/22 1518 09/09/22 0306 09/10/22 1305 09/11/22 0951  NA 131* 129* 130* 130*  K  --  3.7 3.2* 3.2*  CL  --  97* 95* 95*  CO2  --  '27 28 27  '$ GLUCOSE  --  72 113* 93  BUN  --  7* 5* 7*   CREATININE  --  3.80* 3.06* 3.73*  CALCIUM  --  7.7* 8.2* 8.0*  MG  --  1.6* 2.4  --   PHOS  --  1.9* 2.9  --    Liver Function Tests: No results for input(s): "AST", "ALT", "ALKPHOS", "BILITOT", "PROT", "ALBUMIN" in the last 168 hours. CBG: Recent Labs  Lab 09/14/22 0913 09/14/22 2140 09/15/22 0234 09/15/22 0514 09/15/22 0854  GLUCAP 54* 46* 41* 42* 32*    Discharge time spent: less than 30 minutes.  Signed: Annita Brod, MD [facility] 09/15/2022

## 2022-09-15 NOTE — Progress Notes (Signed)
Report called to hospice home in [place], spoke with [place]. Patient awaiting transport to hospice.

## 2022-09-15 NOTE — Progress Notes (Addendum)
Manufacturing engineer [facility]) Hospital Liaison Note  Son/Carl, contacted [facility] requesting patient is transferred to the [place]. MSW notified MD/Krishnan and he is in agreement for transfer to the [place]-- TOC/Courtney aware.   Approval for Hospice Home is determined by [facility] MD. Once [facility] MD has determined Hospice Home eligibility, [place] will update hospital staff and family.  Addendum 12:25 pm-- Consent forms have been completed.  EMS notified of patient D/C and transport arranged by TOC/Darrian. Attending Physician/Dr.Krishnan also notified of transport arrangement.    Please send signed DNR form with patient and RN call report to [phone].    Daphene Calamity, MSW [facility] Liaison [phone]

## 2022-09-15 NOTE — Progress Notes (Signed)
EMS here to transport pt to hospice care.

## 2022-09-22 DEATH — deceased

## 2022-10-01 ENCOUNTER — Ambulatory Visit (INDEPENDENT_AMBULATORY_CARE_PROVIDER_SITE_OTHER): Payer: Medicare Other | Admitting: Nurse Practitioner

## 2022-10-13 ENCOUNTER — Ambulatory Visit (INDEPENDENT_AMBULATORY_CARE_PROVIDER_SITE_OTHER): Payer: Medicare Other | Admitting: Vascular Surgery
